# Patient Record
Sex: Male | Born: 1960 | Race: Black or African American | Hispanic: No | Marital: Married | Smoking: Never smoker
Health system: Southern US, Community
[De-identification: ages and names within clinical notes are randomized; demographics above are authoritative.]

## PROBLEM LIST (undated history)

## (undated) DIAGNOSIS — N186 End stage renal disease: Secondary | ICD-10-CM

## (undated) DIAGNOSIS — N289 Disorder of kidney and ureter, unspecified: Secondary | ICD-10-CM

## (undated) DIAGNOSIS — M1990 Unspecified osteoarthritis, unspecified site: Secondary | ICD-10-CM

## (undated) DIAGNOSIS — K828 Other specified diseases of gallbladder: Secondary | ICD-10-CM

## (undated) DIAGNOSIS — N2889 Other specified disorders of kidney and ureter: Secondary | ICD-10-CM

## (undated) DIAGNOSIS — J9601 Acute respiratory failure with hypoxia: Secondary | ICD-10-CM

## (undated) DIAGNOSIS — I4892 Unspecified atrial flutter: Secondary | ICD-10-CM

## (undated) DIAGNOSIS — I82409 Acute embolism and thrombosis of unspecified deep veins of unspecified lower extremity: Secondary | ICD-10-CM

## (undated) DIAGNOSIS — Z905 Acquired absence of kidney: Secondary | ICD-10-CM

## (undated) DIAGNOSIS — K219 Gastro-esophageal reflux disease without esophagitis: Secondary | ICD-10-CM

## (undated) DIAGNOSIS — E611 Iron deficiency: Secondary | ICD-10-CM

## (undated) DIAGNOSIS — I2510 Atherosclerotic heart disease of native coronary artery without angina pectoris: Secondary | ICD-10-CM

## (undated) DIAGNOSIS — I219 Acute myocardial infarction, unspecified: Secondary | ICD-10-CM

## (undated) DIAGNOSIS — K5792 Diverticulitis of intestine, part unspecified, without perforation or abscess without bleeding: Secondary | ICD-10-CM

## (undated) DIAGNOSIS — K838 Other specified diseases of biliary tract: Secondary | ICD-10-CM

## (undated) DIAGNOSIS — I639 Cerebral infarction, unspecified: Secondary | ICD-10-CM

## (undated) DIAGNOSIS — Z973 Presence of spectacles and contact lenses: Secondary | ICD-10-CM

## (undated) DIAGNOSIS — I10 Essential (primary) hypertension: Secondary | ICD-10-CM

## (undated) DIAGNOSIS — H5440 Blindness, one eye, unspecified eye: Secondary | ICD-10-CM

## (undated) DIAGNOSIS — R0602 Shortness of breath: Secondary | ICD-10-CM

## (undated) DIAGNOSIS — I509 Heart failure, unspecified: Secondary | ICD-10-CM

## (undated) DIAGNOSIS — Z992 Dependence on renal dialysis: Secondary | ICD-10-CM

## (undated) DIAGNOSIS — K9189 Other postprocedural complications and disorders of digestive system: Secondary | ICD-10-CM

## (undated) DIAGNOSIS — K429 Umbilical hernia without obstruction or gangrene: Secondary | ICD-10-CM

## (undated) HISTORY — PX: FINGER SURGERY: SHX640

## (undated) HISTORY — DX: Acute myocardial infarction, unspecified: I21.9

## (undated) HISTORY — DX: Atherosclerotic heart disease of native coronary artery without angina pectoris: I25.10

## (undated) HISTORY — DX: Essential (primary) hypertension: I10

## (undated) HISTORY — DX: End stage renal disease: N18.6

## (undated) HISTORY — PX: OTHER SURGICAL HISTORY: SHX169

## (undated) HISTORY — PX: HEMORRHOID SURGERY: SHX153

---

## 1997-09-04 ENCOUNTER — Inpatient Hospital Stay (HOSPITAL_COMMUNITY): Admission: RE | Admit: 1997-09-04 | Discharge: 1997-09-06 | Payer: Self-pay | Admitting: Cardiovascular Disease

## 1998-05-24 HISTORY — PX: NEPHRECTOMY: SHX65

## 1998-09-25 ENCOUNTER — Inpatient Hospital Stay (HOSPITAL_COMMUNITY): Admission: EM | Admit: 1998-09-25 | Discharge: 1998-09-28 | Payer: Self-pay | Admitting: Emergency Medicine

## 1998-09-25 ENCOUNTER — Encounter: Payer: Self-pay | Admitting: Emergency Medicine

## 1999-10-15 ENCOUNTER — Encounter: Payer: Self-pay | Admitting: Cardiovascular Disease

## 1999-10-15 ENCOUNTER — Encounter: Admission: RE | Admit: 1999-10-15 | Discharge: 1999-10-15 | Payer: Self-pay | Admitting: Cardiovascular Disease

## 2000-04-22 ENCOUNTER — Encounter: Payer: Self-pay | Admitting: Emergency Medicine

## 2000-04-22 ENCOUNTER — Inpatient Hospital Stay (HOSPITAL_COMMUNITY): Admission: EM | Admit: 2000-04-22 | Discharge: 2000-04-22 | Payer: Self-pay | Admitting: Emergency Medicine

## 2000-05-21 ENCOUNTER — Emergency Department (HOSPITAL_COMMUNITY): Admission: EM | Admit: 2000-05-21 | Discharge: 2000-05-21 | Payer: Self-pay | Admitting: Emergency Medicine

## 2000-05-21 ENCOUNTER — Encounter: Payer: Self-pay | Admitting: *Deleted

## 2000-06-09 ENCOUNTER — Emergency Department (HOSPITAL_COMMUNITY): Admission: EM | Admit: 2000-06-09 | Discharge: 2000-06-09 | Payer: Self-pay | Admitting: Emergency Medicine

## 2000-06-10 ENCOUNTER — Inpatient Hospital Stay (HOSPITAL_COMMUNITY): Admission: EM | Admit: 2000-06-10 | Discharge: 2000-06-15 | Payer: Self-pay | Admitting: Emergency Medicine

## 2000-06-10 ENCOUNTER — Encounter: Payer: Self-pay | Admitting: Emergency Medicine

## 2000-06-12 ENCOUNTER — Encounter: Payer: Self-pay | Admitting: Internal Medicine

## 2000-06-15 ENCOUNTER — Encounter: Payer: Self-pay | Admitting: Cardiovascular Disease

## 2000-06-16 ENCOUNTER — Inpatient Hospital Stay (HOSPITAL_COMMUNITY): Admission: EM | Admit: 2000-06-16 | Discharge: 2000-06-22 | Payer: Self-pay | Admitting: Emergency Medicine

## 2000-06-16 ENCOUNTER — Encounter (INDEPENDENT_AMBULATORY_CARE_PROVIDER_SITE_OTHER): Payer: Self-pay | Admitting: Specialist

## 2000-06-16 ENCOUNTER — Encounter: Payer: Self-pay | Admitting: Emergency Medicine

## 2000-07-11 ENCOUNTER — Encounter: Payer: Self-pay | Admitting: Cardiovascular Disease

## 2000-07-11 ENCOUNTER — Encounter: Admission: RE | Admit: 2000-07-11 | Discharge: 2000-07-11 | Payer: Self-pay | Admitting: Cardiovascular Disease

## 2001-07-02 ENCOUNTER — Emergency Department (HOSPITAL_COMMUNITY): Admission: EM | Admit: 2001-07-02 | Discharge: 2001-07-02 | Payer: Self-pay | Admitting: Emergency Medicine

## 2001-07-04 ENCOUNTER — Inpatient Hospital Stay (HOSPITAL_COMMUNITY): Admission: AD | Admit: 2001-07-04 | Discharge: 2001-07-06 | Payer: Self-pay | Admitting: Cardiovascular Disease

## 2001-07-04 ENCOUNTER — Encounter: Payer: Self-pay | Admitting: Cardiovascular Disease

## 2001-07-05 ENCOUNTER — Encounter: Payer: Self-pay | Admitting: Cardiovascular Disease

## 2001-11-09 ENCOUNTER — Encounter: Admission: RE | Admit: 2001-11-09 | Discharge: 2001-11-09 | Payer: Self-pay | Admitting: Orthopedic Surgery

## 2001-11-09 ENCOUNTER — Encounter: Payer: Self-pay | Admitting: Orthopedic Surgery

## 2001-11-13 ENCOUNTER — Encounter: Admission: RE | Admit: 2001-11-13 | Discharge: 2001-11-13 | Payer: Self-pay | Admitting: Infectious Diseases

## 2001-12-11 ENCOUNTER — Encounter: Admission: RE | Admit: 2001-12-11 | Discharge: 2001-12-11 | Payer: Self-pay | Admitting: Infectious Diseases

## 2001-12-14 ENCOUNTER — Encounter: Admission: RE | Admit: 2001-12-14 | Discharge: 2001-12-14 | Payer: Self-pay | Admitting: Infectious Diseases

## 2001-12-17 ENCOUNTER — Inpatient Hospital Stay (HOSPITAL_COMMUNITY): Admission: EM | Admit: 2001-12-17 | Discharge: 2001-12-19 | Payer: Self-pay | Admitting: Emergency Medicine

## 2001-12-17 ENCOUNTER — Encounter: Payer: Self-pay | Admitting: Emergency Medicine

## 2001-12-19 ENCOUNTER — Encounter (INDEPENDENT_AMBULATORY_CARE_PROVIDER_SITE_OTHER): Payer: Self-pay | Admitting: Cardiovascular Disease

## 2002-01-15 ENCOUNTER — Encounter: Admission: RE | Admit: 2002-01-15 | Discharge: 2002-01-15 | Payer: Self-pay | Admitting: Infectious Diseases

## 2002-05-18 ENCOUNTER — Encounter: Payer: Self-pay | Admitting: Emergency Medicine

## 2002-05-18 ENCOUNTER — Inpatient Hospital Stay (HOSPITAL_COMMUNITY): Admission: EM | Admit: 2002-05-18 | Discharge: 2002-05-21 | Payer: Self-pay | Admitting: Emergency Medicine

## 2002-05-18 ENCOUNTER — Encounter: Payer: Self-pay | Admitting: Cardiovascular Disease

## 2002-05-21 ENCOUNTER — Encounter (INDEPENDENT_AMBULATORY_CARE_PROVIDER_SITE_OTHER): Payer: Self-pay | Admitting: Cardiovascular Disease

## 2002-10-21 ENCOUNTER — Encounter: Payer: Self-pay | Admitting: Emergency Medicine

## 2002-10-21 ENCOUNTER — Inpatient Hospital Stay (HOSPITAL_COMMUNITY): Admission: EM | Admit: 2002-10-21 | Discharge: 2002-10-24 | Payer: Self-pay | Admitting: Emergency Medicine

## 2002-10-22 ENCOUNTER — Encounter: Payer: Self-pay | Admitting: Cardiovascular Disease

## 2002-10-23 ENCOUNTER — Encounter: Payer: Self-pay | Admitting: Cardiovascular Disease

## 2003-04-15 ENCOUNTER — Inpatient Hospital Stay (HOSPITAL_COMMUNITY): Admission: EM | Admit: 2003-04-15 | Discharge: 2003-04-17 | Payer: Self-pay | Admitting: Emergency Medicine

## 2003-06-16 ENCOUNTER — Emergency Department (HOSPITAL_COMMUNITY): Admission: EM | Admit: 2003-06-16 | Discharge: 2003-06-17 | Payer: Self-pay | Admitting: Emergency Medicine

## 2003-11-27 ENCOUNTER — Emergency Department (HOSPITAL_COMMUNITY): Admission: EM | Admit: 2003-11-27 | Discharge: 2003-11-27 | Payer: Self-pay | Admitting: Emergency Medicine

## 2004-01-05 ENCOUNTER — Inpatient Hospital Stay (HOSPITAL_COMMUNITY): Admission: EM | Admit: 2004-01-05 | Discharge: 2004-01-10 | Payer: Self-pay | Admitting: *Deleted

## 2004-01-07 ENCOUNTER — Encounter (INDEPENDENT_AMBULATORY_CARE_PROVIDER_SITE_OTHER): Payer: Self-pay | Admitting: Cardiovascular Disease

## 2004-03-30 ENCOUNTER — Emergency Department (HOSPITAL_COMMUNITY): Admission: EM | Admit: 2004-03-30 | Discharge: 2004-03-30 | Payer: Self-pay | Admitting: Emergency Medicine

## 2005-06-20 ENCOUNTER — Emergency Department (HOSPITAL_COMMUNITY): Admission: EM | Admit: 2005-06-20 | Discharge: 2005-06-20 | Payer: Self-pay | Admitting: Emergency Medicine

## 2006-07-07 ENCOUNTER — Inpatient Hospital Stay (HOSPITAL_COMMUNITY): Admission: EM | Admit: 2006-07-07 | Discharge: 2006-07-12 | Payer: Self-pay | Admitting: Emergency Medicine

## 2007-04-10 ENCOUNTER — Encounter: Admission: RE | Admit: 2007-04-10 | Discharge: 2007-04-10 | Payer: Self-pay | Admitting: Cardiovascular Disease

## 2008-10-10 ENCOUNTER — Ambulatory Visit (HOSPITAL_COMMUNITY): Admission: RE | Admit: 2008-10-10 | Discharge: 2008-10-10 | Payer: Self-pay | Admitting: Cardiovascular Disease

## 2008-11-27 ENCOUNTER — Emergency Department (HOSPITAL_COMMUNITY): Admission: EM | Admit: 2008-11-27 | Discharge: 2008-11-27 | Payer: Self-pay | Admitting: Emergency Medicine

## 2009-07-09 ENCOUNTER — Encounter: Admission: RE | Admit: 2009-07-09 | Discharge: 2009-07-09 | Payer: Self-pay | Admitting: Cardiovascular Disease

## 2009-10-05 ENCOUNTER — Inpatient Hospital Stay (HOSPITAL_COMMUNITY): Admission: EM | Admit: 2009-10-05 | Discharge: 2009-10-07 | Payer: Self-pay | Admitting: Emergency Medicine

## 2009-10-07 ENCOUNTER — Encounter (INDEPENDENT_AMBULATORY_CARE_PROVIDER_SITE_OTHER): Payer: Self-pay | Admitting: Cardiovascular Disease

## 2009-12-22 ENCOUNTER — Encounter: Admission: RE | Admit: 2009-12-22 | Discharge: 2009-12-22 | Payer: Self-pay | Admitting: Cardiovascular Disease

## 2009-12-27 ENCOUNTER — Encounter: Admission: RE | Admit: 2009-12-27 | Discharge: 2009-12-27 | Payer: Self-pay | Admitting: Cardiovascular Disease

## 2010-08-10 LAB — DIFFERENTIAL
Basophils Absolute: 0 10*3/uL (ref 0.0–0.1)
Basophils Relative: 1 % (ref 0–1)
Eosinophils Absolute: 0.3 10*3/uL (ref 0.0–0.7)
Eosinophils Relative: 6 % — ABNORMAL HIGH (ref 0–5)
Lymphocytes Relative: 26 % (ref 12–46)
Lymphs Abs: 1.2 10*3/uL (ref 0.7–4.0)
Monocytes Absolute: 0.4 10*3/uL (ref 0.1–1.0)
Monocytes Relative: 9 % (ref 3–12)
Neutro Abs: 2.7 10*3/uL (ref 1.7–7.7)
Neutrophils Relative %: 59 % (ref 43–77)

## 2010-08-10 LAB — BASIC METABOLIC PANEL
BUN: 21 mg/dL (ref 6–23)
BUN: 25 mg/dL — ABNORMAL HIGH (ref 6–23)
CO2: 26 mEq/L (ref 19–32)
CO2: 27 mEq/L (ref 19–32)
Calcium: 8.7 mg/dL (ref 8.4–10.5)
Calcium: 8.9 mg/dL (ref 8.4–10.5)
Chloride: 103 mEq/L (ref 96–112)
Chloride: 107 mEq/L (ref 96–112)
Creatinine, Ser: 2.11 mg/dL — ABNORMAL HIGH (ref 0.4–1.5)
Creatinine, Ser: 2.34 mg/dL — ABNORMAL HIGH (ref 0.4–1.5)
GFR calc Af Amer: 36 mL/min — ABNORMAL LOW (ref >60)
GFR calc Af Amer: 41 mL/min — ABNORMAL LOW (ref >60)
GFR calc non Af Amer: 30 mL/min — ABNORMAL LOW (ref >60)
GFR calc non Af Amer: 34 mL/min — ABNORMAL LOW (ref >60)
Glucose, Bld: 104 mg/dL — ABNORMAL HIGH (ref 70–99)
Glucose, Bld: 106 mg/dL — ABNORMAL HIGH (ref 70–99)
Potassium: 3.9 mEq/L (ref 3.5–5.1)
Potassium: 4.5 mEq/L (ref 3.5–5.1)
Sodium: 135 mEq/L (ref 135–145)
Sodium: 137 mEq/L (ref 135–145)

## 2010-08-10 LAB — COMPREHENSIVE METABOLIC PANEL
ALT: 25 U/L (ref 0–53)
AST: 25 U/L (ref 0–37)
Albumin: 3.2 g/dL — ABNORMAL LOW (ref 3.5–5.2)
Alkaline Phosphatase: 91 U/L (ref 39–117)
BUN: 21 mg/dL (ref 6–23)
CO2: 26 mEq/L (ref 19–32)
Calcium: 9 mg/dL (ref 8.4–10.5)
Chloride: 105 mEq/L (ref 96–112)
Creatinine, Ser: 2.39 mg/dL — ABNORMAL HIGH (ref 0.4–1.5)
GFR calc Af Amer: 36 mL/min — ABNORMAL LOW (ref >60)
GFR calc non Af Amer: 29 mL/min — ABNORMAL LOW (ref >60)
Glucose, Bld: 90 mg/dL (ref 70–99)
Potassium: 4.3 mEq/L (ref 3.5–5.1)
Sodium: 139 mEq/L (ref 135–145)
Total Bilirubin: 0.5 mg/dL (ref 0.3–1.2)
Total Protein: 6.5 g/dL (ref 6.0–8.3)

## 2010-08-10 LAB — LIPID PANEL
Cholesterol: 163 mg/dL (ref 0–200)
HDL: 52 mg/dL (ref >39)
LDL Cholesterol: 62 mg/dL (ref 0–99)
Total CHOL/HDL Ratio: 3.1 RATIO
Triglycerides: 246 mg/dL — ABNORMAL HIGH (ref <150)
VLDL: 49 mg/dL — ABNORMAL HIGH (ref 0–40)

## 2010-08-10 LAB — CARDIAC PANEL(CRET KIN+CKTOT+MB+TROPI)
CK, MB: 3.9 ng/mL (ref 0.3–4.0)
CK, MB: 4.3 ng/mL — ABNORMAL HIGH (ref 0.3–4.0)
Relative Index: 0.7 (ref 0.0–2.5)
Relative Index: 0.9 (ref 0.0–2.5)
Total CK: 479 U/L — ABNORMAL HIGH (ref 7–232)
Total CK: 536 U/L — ABNORMAL HIGH (ref 7–232)
Troponin I: 0.24 ng/mL — ABNORMAL HIGH (ref 0.00–0.06)
Troponin I: 0.25 ng/mL — ABNORMAL HIGH (ref 0.00–0.06)

## 2010-08-10 LAB — CBC
HCT: 33.5 % — ABNORMAL LOW (ref 39.0–52.0)
Hemoglobin: 11.5 g/dL — ABNORMAL LOW (ref 13.0–17.0)
MCHC: 34.3 g/dL (ref 30.0–36.0)
MCV: 89.8 fL (ref 78.0–100.0)
Platelets: 222 10*3/uL (ref 150–400)
RBC: 3.73 MIL/uL — ABNORMAL LOW (ref 4.22–5.81)
RDW: 15.9 % — ABNORMAL HIGH (ref 11.5–15.5)
WBC: 4.7 10*3/uL (ref 4.0–10.5)

## 2010-08-10 LAB — HEMOGLOBIN A1C
Hgb A1c MFr Bld: 5.8 % — ABNORMAL HIGH (ref <5.7)
Mean Plasma Glucose: 120 mg/dL — ABNORMAL HIGH (ref <117)

## 2010-08-10 LAB — BRAIN NATRIURETIC PEPTIDE
Pro B Natriuretic peptide (BNP): 36 pg/mL (ref 0.0–100.0)
Pro B Natriuretic peptide (BNP): 42 pg/mL (ref 0.0–100.0)

## 2010-08-10 LAB — APTT: aPTT: 33 seconds (ref 24–37)

## 2010-08-10 LAB — PROTIME-INR
INR: 0.95 (ref 0.00–1.49)
Prothrombin Time: 12.6 seconds (ref 11.6–15.2)

## 2010-08-10 LAB — GLUCOSE, CAPILLARY
Glucose-Capillary: 102 mg/dL — ABNORMAL HIGH (ref 70–99)
Glucose-Capillary: 105 mg/dL — ABNORMAL HIGH (ref 70–99)
Glucose-Capillary: 111 mg/dL — ABNORMAL HIGH (ref 70–99)
Glucose-Capillary: 121 mg/dL — ABNORMAL HIGH (ref 70–99)
Glucose-Capillary: 130 mg/dL — ABNORMAL HIGH (ref 70–99)
Glucose-Capillary: 151 mg/dL — ABNORMAL HIGH (ref 70–99)
Glucose-Capillary: 90 mg/dL (ref 70–99)

## 2010-08-10 LAB — POCT CARDIAC MARKERS
CKMB, poc: 4.1 ng/mL (ref 1.0–8.0)
Myoglobin, poc: 391 ng/mL (ref 12–200)
Troponin i, poc: <0.05 ng/mL (ref 0.00–0.09)

## 2010-08-10 LAB — TROPONIN I: Troponin I: 0.25 ng/mL — ABNORMAL HIGH (ref 0.00–0.06)

## 2010-08-10 LAB — CK TOTAL AND CKMB (NOT AT ARMC)
CK, MB: 5.1 ng/mL — ABNORMAL HIGH (ref 0.3–4.0)
Relative Index: 1.1 (ref 0.0–2.5)
Total CK: 470 U/L — ABNORMAL HIGH (ref 7–232)

## 2010-08-10 LAB — MAGNESIUM: Magnesium: 2.5 mg/dL (ref 1.5–2.5)

## 2010-09-01 LAB — POCT I-STAT 3, VENOUS BLOOD GAS (G3P V)
Acid-Base Excess: 1 mmol/L (ref 0.0–2.0)
Bicarbonate: 27.5 mEq/L — ABNORMAL HIGH (ref 20.0–24.0)
O2 Saturation: 67 %
TCO2: 29 mmol/L (ref 0–100)
pCO2, Ven: 51 mmHg — ABNORMAL HIGH (ref 45.0–50.0)
pH, Ven: 7.339 — ABNORMAL HIGH (ref 7.250–7.300)
pO2, Ven: 38 mmHg (ref 30.0–45.0)

## 2010-09-01 LAB — POCT I-STAT 3, ART BLOOD GAS (G3+)
Acid-Base Excess: 1 mmol/L (ref 0.0–2.0)
Bicarbonate: 26.9 mEq/L — ABNORMAL HIGH (ref 20.0–24.0)
O2 Saturation: 96 %
TCO2: 28 mmol/L (ref 0–100)
pCO2 arterial: 45.2 mmHg — ABNORMAL HIGH (ref 35.0–45.0)
pH, Arterial: 7.382 (ref 7.350–7.450)
pO2, Arterial: 82 mmHg (ref 80.0–100.0)

## 2010-09-01 LAB — GLUCOSE, CAPILLARY
Glucose-Capillary: 108 mg/dL — ABNORMAL HIGH (ref 70–99)
Glucose-Capillary: 136 mg/dL — ABNORMAL HIGH (ref 70–99)

## 2010-10-09 NOTE — Discharge Summary (Signed)
. [facility]  Patient:    [name], [name]                       MRN: [id number] Adm. Date:  [id number] Disc. Date: [id number] Attending:  Hanley Ben CC:         Birdie Riddle, M.D.   Discharge Summary  DISCHARGE DIAGNOSES: 1. Right renal carcinoma. 2. Right hydronephrosis. 3. Coronary artery disease. 4. Cardiomyopathy.  PROCEDURE DONE:  Right radical nephrectomy on June 17, 2000.  CONSULTATIONS:  Birdie Riddle, M.D.  HISTORY OF PRESENT ILLNESS:  The patient is a 50 year old male who was admitted to the hospital a week before this admission complaining of fever, nausea and vomiting and right flank pain.  During the work-up he was found to have a solid mass in the lower pole of the right kidney.  The CT scan showed a 6.8 x 7.5 cm solid mass in the lower pole of the right kidney consistent with renal cell carcinoma and right upper pole hydronephrosis.  The patient wanted to go home to take care of some personal business; however, he returned to the emergency room the next day with the same symptoms and he was admitted for treatment of his right renal cell carcinoma.  PHYSICAL EXAMINATION:  VITAL SIGNS:  Blood pressure 147/94, pulse 69, respiratory rate 16, temperature 98.  HEENT:  His head is normal.  Pupils are equal, round, reactive to light and accommodation.  Ears, nose and throat within normal limits.  NECK:  Supple.  No cervical lymph nodes.  No thyromegaly.  CHEST:  Symmetrical.  LUNGS:  Full expanded and clear to auscultation and percussion.  CARDIOVASCULAR:  Regular rhythm with no murmurs or gallops.  ABDOMEN:  Soft and nondistended.  Liver, spleen and kidneys not palpable.  No organomegaly.  He had right flank and right CVA tenderness.  Bowel sounds were normal.  GENITALIA:  Penis is uncircumcised.  Meatus is normal.  Scrotum, cords, testicles and epididymis are within normal limits.  EXTREMITIES:  Within normal  limits.  LABS:  Hemoglobin on admission was 13.5, hematocrit 39.1, wbc 6.0.  PT and PTT were within normal limits.  Sodium 130, potassium 3.9, BUN 18, creatinine 1.1. Urinalysis normal.  Chest x-ray showed cardiomegaly.  HOSPITAL COURSE:  The patient had a right radical nephrectomy on June 17, 2000.  Postoperative course was uneventful.  The patient had a low grade temperature postoperatively.  He tolerated his diet well.  The diet was gradually advanced.  He was voiding well.  His urine was clear.  Three days postoperatively he had a hematoma of the left elbow secondary to IV infiltration and the hematoma was gradually decreasing in size.  On June 22, 2000, he was eating well.  His abdomen was soft and nondistended, bowel sounds were normal.  Wound was clean and dry.  He was then discharged home.  DISCHARGE MEDICATIONS: 1. Protonix 40 mg p.o. daily. 2. Procardia XL 60 mg p.o. daily. 3. Hydrochlorothiazide 12.5 mg p.o. daily. 4. Toprol XL 25 mg p.o. daily. 5. Diovan 80 mg p.o. daily. 6. Clonidine 0.2 mg p.o. daily. 7. Tequin 400 mg p.o. daily for one week. 8. Coumadin 5 mg p.o. daily. 9. Percocet 5/325 one or two tablets p.o. q.4h. p.r.n. for pain.  DISCHARGE ACTIVITIES:  The patient is instructed not to do any lifting, straining, or any driving until further advised.  DISCHARGE DIET:  Low fat, low salt diet.  CONDITION  ON DISCHARGE:  Improved. DD:  06/22/00 TD:  06/22/00 Job: [id number]

## 2010-10-09 NOTE — Cardiovascular Report (Signed)
NAME:  [name], [name]                           ACCOUNT NO.:  [id number]   MEDICAL RECORD NO.:  [id number]                   PATIENT TYPE:  INP   LOCATION:  2016                                 FACILITY:  [facility]   PHYSICIAN:  Birdie Riddle, M.D.               DATE OF BIRTH:  1960/07/03   DATE OF PROCEDURE:  10/24/2002  DATE OF DISCHARGE:                              CARDIAC CATHETERIZATION   PROCEDURE:  1. Right and left heart catheterization.  2. Selective coronary angiography.  3. Swan-Ganz catheter placement.  4. Cardiac output studies.   INDICATIONS:  This 50 year old black male has history of congestive heart  failure, dilated cardiomyopathy, and reversible ischemia on his recent  nuclear stress test.   APPROACH:  Right femoral artery using 6-French diagnostic catheter and right  femoral vein using 8-French diagnostic catheter.   COMPLICATIONS:  None.   Perclose suture applied successfully.   HEMODYNAMIC DATA:  The left ventricular pressure was 169/24 and aortic  pressure was 164/115.  The right atrial pressure was 22, right ventricle  pressure 65/19, pulmonary artery pressure 40/28, wedge pressure 23/21.   Cardiac output was 5.6 L/minute.  Cardiac index was 2.49.  SVR was elevated  at 1611.  Oxygen saturation was 69% in pulmonary artery and 97% in aorta.   CORONARY ANATOMY:  The left main coronary artery was very short.   Left anterior descending coronary artery:  The left anterior descending  coronary artery had approximately 20-30% lesions x2 and it wrapped around  the apex of the heart supplying distal one-third of the posterior septum.   Left circumflex coronary artery:  The left circumflex coronary artery was  nondominant, otherwise, unremarkable.  Had a very large ramus branch, small  obtuse marginal branch one and four, and normal size obtuse marginal branch  two and three.  The posterior descending coronary artery was unremarkable.   Right coronary  artery:  The right coronary artery was nondominant, otherwise  unremarkable.   LEFT VENTRICULOGRAM:  The left ventriculogram was not done to conserve the  dye use.  A total of 80 mL of dye was used.   MEDICATIONS USED:  The patient received Zofran 4 mg IV for nausea post  cardiac output studies and received 2.5 mg Lopressor, one sublingual  nitroglycerin spray, 200 mcg intracoronary nitroglycerin, 40 mg of Lasix for  blood pressure control and congestive heart failure.   IMPRESSION:  1. Mild left anterior descending coronary artery disease.  2. Hypertensive heart disease.   RECOMMENDATIONS:  This patient will be treated medically with Birdie Riddle, M.D.                                               Birdie Riddle, M.D.    ASK/MEDQ  D:  10/24/2002  T:  10/24/2002  Job:  [id number]

## 2010-10-09 NOTE — Discharge Summary (Signed)
NAME:  [name], [name]                           ACCOUNT NO.:  [id number]   MEDICAL RECORD NO.:  [id number]                   PATIENT TYPE:  INP   LOCATION:  2016                                 FACILITY:  [facility]   PHYSICIAN:  Birdie Riddle, M.D.               DATE OF BIRTH:  [birth year]   DATE OF ADMISSION:  10/21/2002  DATE OF DISCHARGE:  10/24/2002                                 DISCHARGE SUMMARY   PRINCIPAL DIAGNOSES:  1. Noncardiac chest pain.  2. Dilated cardiomyopathy.  3. Congestive heart failure.  4. Hypertension.   DISCHARGE MEDICATIONS:  1. Lasix 40 mg one daily.  2. K-Dur 10 mEq two daily.  3. Avapro 150 mg one daily.  4. Norvasc 10 mg daily.  5. Coreg 12.5 mg b.i.d.  6. Flexeril 10 mg b.i.d.  7. Imdur 60 mg one daily.  8. Darvocet-N 100 one q.i.d.  9. Plavix 75 mg one daily.  10.      ACE inhibitor not used due to episode of cough.  11.      Pain management as above.   ACTIVITY:  As tolerated.   DIET:  Low-fat, low-salt diet.   WOUND CARE:  The patient is to notify us if he has right groin pain,  swelling, or discharge.   FOLLOWUP:  By Dr. Dixie Dials in two weeks. The patient to call [phone].   HISTORY OF PRESENT ILLNESS:  This 50 year old black male with known dilated  cardiomyopathy had an episode of chest pain in the left scapular area.   PHYSICAL EXAMINATION:  HEENT:  Grossly unremarkable.  NECK:  No JVD. No carotid bruit.  LUNGS:  Clear bilaterally.  HEART:  Normal S1 and S2 with grade 2/6 systolic murmur.  ABDOMEN:  Slightly distended, soft.  EXTREMITIES:  No edema.   LABORATORY DATA:  Hemoglobin 14, hematocrit 42, normal WBC count, platelet  count. Normal electrolytes, BUN, creatinine. Normal CK-MB. Slightly high  Troponin-I 0.27.   X-rays of the spine unremarkable. Nuclear stress test:  Global hypokinesia  with a fixed defect involving the basilar half of the inferior wall and  patchy perfusion defect in the mid portion of the lateral  wall. Chest x-ray:  Cardiomegaly and marked vascular congestion.   EKG:  Sinus rhythm with a right atrial enlargement and T-wave abnormality.  Also lateral ischemia and left ventricular hypertrophy.   Right and left heart catheterization showed cardiac output of 5.6. Elevated  left ventricular pressure of 169 x 24. Aortic pressure of 164 x 115. Wedge  pressure mean was elevated at 28. O2 saturation was 69%. Pulmonary artery  97% in aorta. Coronary artery showed a 20-30% lesion x2 in the LAD and RCA  was small and nondominant. The patient required 4 mg of Nausea postcardiac  output measurements and he also received 2.5 mg of Lopressor, sublingual  nitroglycerin spray and 40 of Lasix for blood pressure  control.   HOSPITAL COURSE:  The patient was admitted to the Telemetry unit. His  cardiac enzymes were abnormal. He received IV Lasix for diuresis. He  underwent a nuclear stress test because of his somewhat atypical chest pain.  But this turned out to be marginally positive for patchy areas of reversible  ischemia and so he underwent right and left heart catheterization, which  failed to show any significant coronary artery disease. He was discharged  home in satisfactory condition with fairly good control of his blood  pressure. He will be followed by me in two weeks.                                               Birdie Riddle, M.D.    ASK/MEDQ  D:  11/07/2002  T:  11/08/2002  Job:  [id number]

## 2010-10-09 NOTE — Consult Note (Signed)
NAME:  [name], [name]                           ACCOUNT NO.:  [id number]   MEDICAL RECORD NO.:  [id number]                   PATIENT TYPE:  INP   LOCATION:  4706                                 FACILITY:  [facility]   PHYSICIAN:  Pramod P. Leonie Man, MD                 DATE OF BIRTH:  [birth year]   DATE OF CONSULTATION:  01/05/2004  DATE OF DISCHARGE:                                   CONSULTATION   REFERRING PHYSICIAN:  Dr. Birdie Riddle.   REASON FOR REFERRAL:  Right hand weakness.   HISTORY OF PRESENT ILLNESS:  [name] is Stephenson 27 year old African American  male who complained of right hand pain and weakness yesterday.  He stated  that he noted that he was dropping objects and had difficulty holding  objects with his hand.  This morning, he noticed worsening of the hand  weakness further, as well as some right shoulder and posterior neck pain and  worsening of his preexisting right leg weakness.  He denies any headache or  blurred vision.  There is no prior history of stroke, TIA or significant  neurological problems.  He does have Stephenson history of some right leg weakness  which is longstanding following Stephenson spider or Stephenson snake bite several years ago.  He has some right knee weakness from that and walks with Stephenson cane.   PAST MEDICAL HISTORY:  His past medical history is significant for:  1. Congestive heart failure.  2. Stephenson remote history of gout.  3. Ischemic heart disease, status post angioplasty stenting.   REVIEW OF SYSTEMS:  Review of systems is significant for some chest pain and  shortness of breath and right hand pain, weakness and neck pain.   PAST SURGICAL HISTORY:  None.   MEDICATION ALLERGIES:  None.   SOCIAL HISTORY:  The patient lives with Stephenson significant-other.  He is on  disability and he does not smoke or drink.   PHYSICAL EXAM:  GENERAL:  Physical exam reveals an obese young male who is  not in distress.  VITAL SIGNS:  He is afebrile.  Pulse rate is 70 per minute, regular.  Respiratory rate is 16 per minute.  Distal pulses are well-felt.  HEENT:  Head is nontraumatic.  NECK:  Neck movements are painful, particularly extension.  There is minor  muscle spasm noted in the paraspinal posterior neck regions.  There is no  point tenderness seen.  EXTREMITIES:  The patient has swelling and increased temperature over the  right wrist and proximal metacarpophalangeal joints.  He also has painful  swelling of the right shoulder with limitation of movements.  CARDIAC:  Regular heart sounds.  LUNGS:  Lungs clear to auscultation.  ABDOMEN:  Abdomen soft and nontender.  NEUROLOGICAL EXAM:  The patient is awake, alert, oriented x3 with normal  speech and language function.  There is no aphasia, apraxia or dysarthria.  Pupils are equal, reactive to light and accommodation.  Visual acuity and  fields are adequate.  Face is symmetric.  Palate elevates normally.  Tongue  is midline.  Motor system exam reveals right upper extremity exam is limited  essentially secondary to pain.  He is able to hold his hand up against  gravity and move his elbow quite well.  Movements of the wrist and fingers  are limited secondary to pain.  Right shoulder movements are severely  limited due to pain.  He can move both lower extremities against gravity,  the right one is weaker than the left.  Deep tendon reflexes are 2+ and  symmetric.  Plantars are both downgoing.  Sensation is grossly intact.  Coordination is impaired on the right.   DATA REVIEWED:  Non-contrast CAT scan of the head done today reveals no  acute abnormalities.   IMPRESSION:  [age]-year-old male with right hand pain and weakness likely  secondary to inflammatory arthritis involving the right wrist, hand and  shoulder.  Etiology most likely gout, given his remote history of gout.  I  doubt he had Stephenson primary neurological reason for his weakness.   PLAN:  The patient is being admitted to Dr. Merrilee Jansky service for further   evaluation.  Recommend treatment of gout with indomethacin 75 mg 3 times Stephenson  day and allopurinol.  X-rays of the right hand, shoulder and neck.  If the  patient's neck pain and right hand weakness persist despite adequate pain  control of his gout, we may consider doing MRI scan of the cervical spine.  At the present time, his pain is severe and he will be unable to tolerate  lying flat in the MRI scan.  I discussed this with the patient and answered  questions.  I will be happy to follow the patient in consult.  Kindly call  for questions.                                               Pramod P. Leonie Man, MD    PPS/MEDQ  D:  01/05/2004  T:  01/06/2004  Job:  [id number]

## 2010-10-09 NOTE — Discharge Summary (Signed)
NAME:  [name], [name]                           ACCOUNT NO.:  [id number]   MEDICAL RECORD NO.:  [id number]                   PATIENT TYPE:  INP   LOCATION:  [id number]                                 FACILITY:  [facility]   PHYSICIAN:  Birdie Riddle, M.D.               DATE OF BIRTH:  [birth year]   DATE OF ADMISSION:  05/18/2002  DATE OF DISCHARGE:  05/21/2002                                 DISCHARGE SUMMARY   PRINCIPAL DIAGNOSES:  1. Congestive heart failure.  2. Hypertension.  3. Diabetic cardiomyopathy.  4. Status post right nephrectomy for renal cell cancer.   DISCHARGE MEDICATIONS:  1. Cozaar 100 mg one p.o. daily.  2. Aspirin 81 mg daily.  3. Protonix 40 mg daily.  4. Lanoxin 0.125 mg daily.  5. Altace 10 mg twice daily.  6. Imdur 120 mg one p.o. daily.  7. Coreg 12.5 mg one twice daily.   DISCHARGE DIET:  Low-fat, low-salt diet.   DISCHARGE ACTIVITY:  As tolerated.   FOLLOW-UP:  Follow up with Birdie Riddle, M.D., in one week.   CONDITION ON DISCHARGE:  Improved.   PRINCIPAL PROCEDURES:  A 2-D echocardiogram showing 30-35% ejection fraction  with a mild LVH and mild MR.  This was stable or slightly improved from  previous study.   HISTORY OF PRESENT ILLNESS:  This 50 year old black male with a history of  dilated cardiomyopathy and very poor LV function has hypertension and  alcoholic cardiomyopathy.  He also had a history of a right nephrectomy for  renal cell CA.  He presented with paroxysmal nocturnal dyspnea, orthopnea,  leg swelling without palpitations, fevers, chills, and cough.  He had a  vague history of chest pain.   PHYSICAL EXAMINATION:  GENERAL APPEARANCE:  The patient was alert and  oriented x 3 and in no acute distress.  VITAL SIGNS:  The blood pressure was 160/120, pulse 68, and respirations 18.  HEENT:  Conjunctivae pink.  Fundi negative for papilledema.  NECK:  Supple with positive JVD and bilateral rhonchi.  CARDIOVASCULAR:  Regular rate and  rhythm with a normal S1 and S2 plus S3 and  S4 gallop.  ABDOMEN:  Soft.  Bowel sounds present.  EXTREMITIES:  No cyanosis, clubbing, or edema.   LABORATORY DATA:  The EKG showed a normal sinus rhythm with right atrial  enlargement and LVH with strain.  CPK 154, MB 2.6, troponin I 0.30.  Potassium 3.9, BUN 19, creatinine 1.2.  The BNP was 1080.  An x-ray of the  chest showed cardiomegaly and pulmonary venous hypertension.  The EKG showed  sinus rhythm with right atrial enlargement and moderate LVH.   HOSPITAL COURSE:  The patient was admitted to the telemetry unit.  A  myocardial infarction was ruled out.  His blood pressure was very well  controlled with the use of diuretic, ACE inhibitor, and beta blocker  combinations.  His  heart failure also improved with the use of diuretic.  Within 72 hours, the patient was feeling well enough to go home.  He  underwent echocardiographic evaluation that showed moderate improvement in  his left ventricular systolic function.  There was mild mitral regurgitation  and a dilated left atrium.  He was advised to comply with his medications to  avoid future admission with heart failure and uncontrolled hypertension.  The patient seemed to agree, however, due to monetary reasons he has shown  this behavior of noncompliance off an on.  He is currently applying for  disability.  This may assist him in procuring medications.  He will also be  directed to seek a local medical assistance program.   DISPOSITION:  He was discharged home in satisfactory condition on May 21, 2002, with follow-up by me in one week.  Most of the medication samples  were provided from the office.                                               Birdie Riddle, M.D.    ASK/MEDQ  D:  05/22/2002  T:  05/23/2002  Job:  [id number]

## 2010-10-09 NOTE — Consult Note (Signed)
[place]  Patient:    [name], [name]                       MRN: [id number] Adm. Date:  [id number] Disc. Date: [id number] Attending:  Katy Apo                          Consultation Report  REASON FOR CONSULTATION:  Right renal mass.  HISTORY OF PRESENT ILLNESS:  Patient is a 50 year old male who was admitted about a week ago with fever and right flank pain.  Patient was worked up and a renal ultrasound showed right upper pole hydronephrosis and dilated renal pelvis and a solid mass below pole of the right kidney.  CT scan of the abdomen showed right hydronephrosis and a 6.7 cm mass in the lower pole of the right kidney consistent with renal cell carcinoma.  PAST MEDICAL HISTORY:  Acute angina in November 2001 and had an emergency angioplasty for coronary artery disease.  MEDICATIONS: 1. Procardia XL 30 mg p.o. daily. 2. Clonidine 0.2 mg p.o. t.i.d. 3. Lasix 40 mg p.o. b.i.d. 4. K-Dur 10 mEq b.i.d. 5. Lotensin 10 mg p.o. b.i.d. 6. Coumadin 5 mg p.o. daily.  SOCIAL HISTORY:  The patient does not smoke nor drink.  He is not married and has two children age 38 and 42.  REVIEW OF SYSTEMS:  The patient denies frequency, hesitancy, dysuria, or gross hematuria.  On admission he had nausea and vomiting and he has not had any nausea or vomiting since.  PHYSICAL EXAMINATION:  GENERAL:  This is a well ______ 50 year old male in no acute distress at this time.  VITAL SIGNS:  Blood pressure is now 156/100, pulse 60, respirations 20, temperature 97.9.  ABDOMEN:  Soft, mildly obese.  Liver, spleen, and kidneys:  Not palpable.  No CVA tenderness.  GENITOURINARY:  Penis is uncircumcised.  Meatus is normal.  Scrotum, testicles, cords, and epididymes are all normal.  IMPRESSION:  Right hydronephrosis and right renal cell carcinoma.  Patient needs a right radical nephrectomy.  First, we need to discontinue his Coumadin and schedule patient  for a right nephrectomy when his PT is within normal limits.  Also, patient would like to go home and take care of some personal business. He will call me with a date within the next few days that is convenient for him.  Thank you.  I will follow the patient with you. DD:  06/15/00 TD:  06/15/00 Job: [id number]

## 2010-10-09 NOTE — Discharge Summary (Signed)
[place]. [facility]  Patient:    BRYAM, BANDT Visit Number: [id number] MRN: [id number]          Service Type: MED Location: [phone] Attending Physician:  Birdie Riddle Dictated by:   Birdie Riddle, M.D. Admit Date:  07/04/2001 Discharge Date: 07/06/2001                             Discharge Summary  DISCHARGE DIAGNOSES: 1. Accelerated hypertension. 2. Abdominal pain. 3. Dilated cardiomyopathy. 4. Status post right kidney surgery for renal cell cancer.  DISCHARGE MEDICATIONS: 1. Norvasc 5 mg one twice daily. 2. Toprol XL 50 mg 1/2 twice daily. 3. Diovan 160 mg one daily. 4. Clonidine 0.2 mg three times daily. 5. Nitroglycerin 0.4 mg per hour, new one daily in a.m., off at bedtime. 6. Tylenol with codeine three to four times daily as needed for pain. 7. Robitussin DM tsp four times daily as needed. 8. Lasix 20 mg one daily. 9. K-Dur 10 mEq one daily.  ACTIVITY:  As tolerated.  DIET:  Low fat, low salt diet.  FOLLOWUP:  Followup by Dr. Dixie Dials in one week.  The patient is to call [phone].  HISTORY OF PRESENT ILLNESS:  This is a 50 year old, African-American male with right-sided kidney surgery scar and abdominal pain along with headache, nausea, diarrhea and dizziness.  The patient had some leg swelling, however, this was improving with use of diuretic.  She has a strong history of hypertension x12 years and has recently reduced alcohol intake.  He had right-sided kidney surgery in January 2002, by Dr. Janice Norrie.  He has remained noncompliant on medications, some of it due to monetary reasons.  PHYSICAL EXAMINATION:  VITAL SIGNS:  Temperature 98, pulse 70, respiratory rate 20, blood pressure 180/130, height 6 feet, weight 234 pounds.  GENERAL:  The patient was alert and oriented x3.  HEENT:  Normocephalic, atraumatic.  Eyes brown with pupils are equal, round and reactive to light, extraocular movements intact.  Funduscopic  exam negative for papilledema.  Mucous membranes pink and moist.  NECK:  No jugular venous distention, no carotid bruits.  LUNGS:  Clear bilaterally.  HEART:  Normal, S1, S2.  ABDOMEN:  Soft with a right lower quadrant area tenderness.  Bowel sounds positive.  EXTREMITIES:  No edema, cyanosis or clubbing.  NEUROLOGIC:  Cranial nerves II-XII intact.  LABORATORY DATA AND X-RAY FINDINGS:  Normal electrolytes with BUN and creatinine borderline at 26 and 1.7, glucose borderline at 119.  Hemoglobin 12.6, hematocrit 37.7, normal WBC and platelet count.  EKG revealed sinus rhythm with left ventricular hypertrophy with strain and left ventricular enlargement.  X-ray of the chest revealed mild congestive heart failure and large pericardial silhouette without an infiltrate.  X-ray of the abdomen without any acute abdominal process.  CT scan of the abdomen and pelvis revealed status post right nephrectomy with no evidence of intestinal obstruction.  Echocardiogram revealed mild to moderately dilated left ventricle with 15-20% ejection fraction and mild mitral regurgitation. Dilated left and right atrium and moderate tricuspid regurgitation.  HOSPITAL COURSE:  The patient was admitted to the telemetry unit.  He was started on his home medications with nitroglycerin.  Echocardiogram was ordered for LV function evaluation.  His condition improved remarkably within the first 24-48 hours of hospitalization.  Diovan was added along with Tylenol with codeine and Robitussin for nonproductive cough symptoms.  Clonidine dose was increased and  CT of the abdomen was obtained to rule out any recurrence of any kidney tumor or obstruction.  The patients condition remained stable.  On July 06, 2001, he was feeling better.  He had no abdominal pain and no headache.  He ambulated well without any discomfort.  Hence, he was discharged to home on July 06, 2001, in satisfactory condition with followup  by me in one to two weeks. Dictated by:   Birdie Riddle, M.D. Attending Physician:  Birdie Riddle DD:  08/01/01 TD:  08/03/01 Job: [id number]

## 2010-10-09 NOTE — H&P (Signed)
[place]. [facility]  Patient:    [name], [name]                       MRN: [id number] Adm. Date:  [id number] Disc. Date: [id number] Attending:  Katy Apo                         History and Physical  CHIEF COMPLAINT:  Right flank pain, nausea, vomiting.  HISTORY OF PRESENT ILLNESS:  The patient is a 50 year old male who was admitted to the hospital about one week ago, complaining of fever, nausea, vomiting, and right flank pain.  His workup included a renal ultrasound that showed right hydronephrosis and a solid mass in the lower pole of the right kidney.  A CT scan showed a 6.8 cm x 7.5 cm solid mass in the lower pole of the right kidney, consistent with renal cell carcinoma.  He also had a right upper pole hydronephrosis.  The patient was advised to have a right radical nephrectomy; however, he wanted to go home to take care of some personal business before surgery.  He returned to the emergency room this morning, complaining of nausea, vomiting, and right flank pain.  The patient is now admitted for his procedure.  PAST MEDICAL HISTORY: 1. History of angina.  He was treated in November 2001, with an angioplasty    for coronary artery disease. 2. History of hypertension.  SOCIAL HISTORY:  He does not smoke or drink.  He is not married.  He has two children ages 53 and 14.  FAMILY HISTORY:  His mother has a history of hypertension.  He has four sisters and four brothers.  ALLERGIES:  No known drug allergies.  CURRENT MEDICATIONS: 1. He is on Catapres 0.2 mg p.o. q.d. 2. Protonix 40 mg p.o. q.d. 3. Procardia XL 60 mg p.o. q.d. 4. Hydrochlorothiazide 12.5 mg q.d. 5. Toprol XL 25 mg p.o. q.d. 6. Diovan 80 mg p.o. q.d.  REVIEW OF SYSTEMS:  No cough, no shortness of breath, no hemoptysis. CARDIOVASCULAR:  No palpitations, no chest pain.  GI:  Nausea and vomiting. No diarrhea or constipation.  GENITOURINARY:  No frequency, hesitancy, or gross  hematuria.  PHYSICAL EXAMINATION:  GENERAL:  This is a well-built 50 year old male, complaining of right flank pain, nausea, and vomiting.  VITAL SIGNS:  Blood pressure on admission 147/95, pulse 69, respirations 16, temperature 98 degrees.  HEENT:  Head is normal.  Pupils equal, reactive to light and accommodation. Ears, nose, throat within normal limits.  NECK:  Supple, no cervical lymph nodes.  No thyromegaly.  CHEST:  Symmetrical.  Lungs are fully expanded and clear to percussion and auscultation.  HEART:  A regular rhythm.  No murmur, rub, or gallop.  ABDOMEN:  Soft, nondistended, nontender.  Liver, kidneys, spleen not palpable. No organomegaly.  He has right flank and right CVA tenderness.  Bowel sounds normal.  GENITOURINARY:  Penis is uncircumcised.  The meatus is normal.  Scrotum, testicles, cords, and epididymides are within normal limits.  EXTREMITIES:  Within normal limits.  No pedal edema.  No deformities.  RECTAL:  Deferred.  ADMISSION DIAGNOSES: 1. Right renal cell carcinoma. 2. Right hydronephrosis. DD:  06/16/00 TD:  06/16/00 Job: [id number]

## 2010-10-09 NOTE — H&P (Signed)
[place]. [facility]  Patient:    [name], [name]                       MRN: [id number] Adm. Date:  [id number] Disc. Date: [id number] Attending:  Katy Apo                         History and Physical  CHIEF COMPLAINT:  Refractory abdominal pain, nausea and vomiting, with fever.  HISTORY OF PRESENT ILLNESS:  This is the second [facility] admission for this 50 year old single black male, who was doing well until approximately three days ago.  The patient awoke with significant nausea and vomiting.  This was associated with flank pain bilaterally.  Over the subsequent days he continued to have recurrent nausea and vomiting.  He noted diffuse arthralgias with night sweats.  He had documented fever during this time as well.  Occasional cough productive of whitish phlegm.  The patient notably did not take Tylenol.  He was not able to eat or drink a significant amount of liquids over the past three days.  He did come to the emergency room briefly, but was advised to take Tylenol for a presumptive viral syndrome.  Today his symptoms progressed, with him having a fever to 103 degrees.  The patient subsequently came to the emergency room for further evaluation.  His history is significant for him having recently starting to work in [employer] of one weeks duration.  He did eat chicken, as well as a salad. He is unaware of anyone else being ill or having similar symptoms.  SOCIAL HISTORY:  The patient does not smoke or drink.  No recent sexual activity.  No illicit drug use.  PAST MEDICAL HISTORY:  His history is significant for him having presented to [facility] in November 2001, with acute angina.  He was evaluated by Dr. Birdie Riddle at that time, and underwent an emergency angioplasty for coronary artery disease.  The past history otherwise is unremarkable, other than he was noted to  be hypertensive.  ALLERGIES:  No known drug allergies.  CURRENT MEDICATIONS: 1. Procardia XL 30 mg p.o. q.d. 2. Clonidine 0.2 mg p.o. t.i.d. 3. Lasix 40 mg b.i.d. 4. K-Dur 10 mEq b.i.d. 5. Lotensin 10 mg b.i.d. 6. Coumadin 5 mg q.d.  (The patients record is unavailable.)  PHYSICAL EXAMINATION:  GENERAL:  He is an ill-appearing black male, in no acute distress.  VITAL SIGNS:  Revealed blood pressure of 146/101, pulse 114, respirations 18, temperature 103.8 degrees.  O2 saturation 97% on room air.  HEENT:  Head normocephalic, atraumatic.  Extraocular muscles intact.  There is no sinus tenderness.  No scleral icterus.  Nose:  Mild turbinate edema bilaterally.  Tympanic membranes clear.  Throat:  Membranes are dry.  No posterior pharyngeal erythema.  NECK:  Supple, no enlarged thyroid.  No posterior cervical nodes.  He has mild left submental nodes, tender to palpation.  LUNGS:  Clear, without wheezes, rales, or rhonchi.  No E to A changes.  ABDOMEN:  He has mild bilateral flank tenderness to percussion.  Bowel sounds are active.  No rushes appreciated.  There is mild distention.  No epigastric tenderness.  No lower abdominal tenderness.  CARDIOVASCULAR:  He has normal S1, normal S2.  No S3.  No S4.  He has a 1/6 systolic ejection murmur in the lower left sternal border.  No  rub appreciated.  MUSCULOSKELETAL:  He has a full range of motion in all extremities.  No cyanosis or clubbing.  Minimal crepitus in the knees.  Negative Homans.  No edema.  NEUROLOGIC:  He is alert and oriented x 3.  Cranial nerves intact. Cerebellar, sensory, and motor functions grossly intact.  LABORATORY DATA:  CBC reveals a WBC of 13,600, hemoglobin 13.9, hematocrit of 40.4, 77 polys, 11% lymphs, 0% eosinophils.  Chemistry panel:  Sodium 130, potassium 4.0, chloride 93, CO2 of 25, BUN 18, creatinine 1.4, glucose 90. Calcium 9.2, total protein 8.5, albumin 3.3, SGOT 27, SGPT 22,  alkaline phosphatase 73, bilirubin total 1.7, amylase 85.  PT 13.6 seconds. Urinalysis:  A pH of 7.5, specific gravity 1.023, urobilinogen 2.0 mg per dl, otherwise negative.  Electrocardiogram pending.  IMPRESSION: 1. Acute febrile illness with diarrhea, nausea, and vomiting.  Rule out    infectious colitis.  Rule out viral gastroenteritis.  Rule out acute    hepatitis. 2. Dehydration. 3. Refractory fever, secondary to above. 4. History of coronary artery disease, status post angioplasty. 5. Hypertension. 6. Mild obesity.  PLAN:  The patient is admitted for further evaluation.  He will be placed on IV fluids for hydration.  Blood cultures have been obtained.  He is placed on IV Zosyn at this time.  Stools will be obtained for enteric pathogens, O&P, and leukocytes.  Control nausea and vomiting with oral, versus IV Phenergan at this time.  Will obtain an acute hepatitis panel as well.  Monitor on telemetry, in view of a past history of coronary artery disease.  Further therapy, pending response to the above.  Will control his fever acutely with Tylenol at this time. DD:  06/10/00 TD:  06/11/00 Job: [id number]

## 2010-10-09 NOTE — Discharge Summary (Signed)
NAME:  [name], [name]                           ACCOUNT NO.:  [id number]   MEDICAL RECORD NO.:  [id number]                   PATIENT TYPE:  INP   LOCATION:  0345                                 FACILITY:  [facility]   PHYSICIAN:  Birdie Riddle, M.D.               DATE OF BIRTH:  04-24-1961   DATE OF ADMISSION:  12/17/2001  DATE OF DISCHARGE:  12/19/2001                                 DISCHARGE SUMMARY   PRINCIPAL DIAGNOSES:  1. Congestive heart failure.  2. Dilated cardiomyopathy.  3. Status post renal cell carcinoma with right nephrectomy.  4. Right knee pain.   DISCHARGE MEDICATIONS:  1. Lasix 40 mg daily.  2. Toprol XL 50 mg daily.  3. Norvasc 10 mg daily.  4. Clonidine 0.2 mg b.i.d.  5. K-Dur 20 mEq b.i.d.  6. Diovan 160 mg one daily.   DISCHARGE ACTIVITIES:  As tolerated.   DIET:  Low fat, low salt with 1800 cc fluids restricted diet.   FOLLOW UP:  By Dr. Dixie Dials in two weeks.   HISTORY:  This 50 year old black male with a history of hypertension,  coronary artery disease, dilated cardiomyopathy had an episode of shortness  of breath.  The patient admitted to noncompliance on diet.   PHYSICAL EXAMINATION:  VITAL SIGNS:  Blood pressure 161/113.  GENERAL:  In no acute distress.  HEENT:  Throat is clear, unremarkable.  NECK:  No JVD, negative for bruit.  LUNGS:  Few rales at the bases.  HEART:  Normal S1, S2.  No S3.  ABDOMEN:  Soft and nontender.  EXTREMITIES:  No edema.  NEUROLOGIC:  No deficit.   LABORATORY DATA:  Normal CPK and borderline troponin I.  Lipid profile  unremarkable with a total cholesterol 135 and triglyceride 102, HDL  cholesterol 42.  Normal hemoglobin, hematocrit.  WBC, platelet count normal,  electrolytes:  BUN, creatinine, and liver enzymes.  Borderline albumin  level.   Echocardiogram showed dilated heart with ejection fraction of 20-25%.  EKG:  Sinus rhythm with biatrial enlargement, left ventricular hypertrophy.   HOSPITAL COURSE:   The patient was admitted to telemetry unit.  Myocardial  infarction was ruled out.  He responded very well to IV Lasix and IV  nitroglycerin.  He lost approximately 8 pounds in two days and had no  respiratory distress.  His old cardiac catheterization was reviewed which  showed minimal coronary artery disease.  His echocardiogram showed dilated  cardiomyopathy.  His heart failure is secondary to poor blood pressure  control for a long time.  Overall, patient felt better, ambulated without  any chest pain, and was discharged home in satisfactory condition and will  be followed by me in one week.  Birdie Riddle, M.D.    ASK/MEDQ  D:  01/03/2002  T:  01/05/2002  Job:  [id number]

## 2010-10-09 NOTE — Discharge Summary (Signed)
[name], [name] NO.:  [id number]   MEDICAL RECORD NO.:  [id number]          PATIENT TYPE:  INP   LOCATION:  4706                         FACILITY:  [facility]   PHYSICIAN:  Birdie Riddle, M.D.  DATE OF BIRTH:  [birth year]   DATE OF ADMISSION:  01/05/2004  DATE OF DISCHARGE:  01/10/2004                                 DISCHARGE SUMMARY   PRINCIPAL DIAGNOSES:  1.  Congestive heart failure.  2.  Gouty arthritis.  3.  Right upper and lower extremity weakness.  4.  Status post renal cell cancer with surgery.   DISCHARGE MEDICATIONS:  1.  Labetalol 200 mg one twice daily.  2.  Cogentin 0.6 mg one twice daily.  3.  Allopurinol 200 mg one daily.  4.  Oxycodone/APAP 5/325 mg one four times daily.  5.  Norvasc 10 mg one daily.  6.  Protonix 40 mg one daily.  7.  Flexeril 10 mg one half tablet three times daily.  8.  Lasix 40 mg one daily.  9.  Kay Ciel 10 mEq two daily.  10. Coumadin 5 mg one daily.  11. __________ one tablet three times daily.  12. Avapro 300 mg one daily.   DISCHARGE DIET:  Low-fat, low-salt diet.   DISCHARGE ACTIVITIES:  1.  The patient is to record daily weight on the same scale at the same time      of the day.  2.  Avoid straining.  3.  Stop any activity that causes chest pain, shortness of breath,      dizziness, sweating or excessive weakness.  4.  The patient is to continue following the __________ book.  5.  The patient is to call doctor if he gains three to four pounds of weight      or has shortness of breath or swelling of the hands, feet or stomach or      if he needs an extra pillow to sleep at night in order to breathe.   FOLLOW UP:  By Dr. Dixie Dials in one week.  The patient is to call [phone] for appointment.   CONDITION ON DISCHARGE:  Improved.   HISTORY:  This 50 year old black male with history of noncompliance  presented with chest pain, shortness of breath, leg swelling along with  weakness and neck pain.   The patient had CT of the brain done in the  emergency room that was negative for a tumor or bleed and had good diuresis  with Lasix use in the emergency room.  He has a history of hypertension for  15 years and right-sided __________ for renal cell CA in 2002.   PHYSICAL EXAMINATION:  VITAL SIGNS:  Temperature 98.6; pulse 90;  respirations 19; blood pressure 205/30; height 6 feet; weight 244 pounds.  GENERAL:  The patient was alert and oriented times two.  HEENT:  Normocephalic, atraumatic.  Brown eyes.  Conjunctivae pink.  Sclerae  nonicteric, appears sleepy.  NECK:  No JVD, no carotid bruits, but positive pain in the neck on  palpation.  LUNGS:  Clear anteriorly  with few basil crackles.  HEART:  Normal S1, S2 with grade 2/6 systolic murmur.  ABDOMEN:  Soft, nontender.  EXTREMITIES:  Trace edema with right upper and lower extremity weakness.  CNS:  Cranial nerves II-XII grossly intact, but right hand pain, weakness  more than right lower extremity weakness.   LABORATORY DATA:  Hemoglobin 12.6, hematocrit 37.8, WBC count 7.3, platelet  count 251,000.  PT 13.8, INR 1.1. Sodium 135, potassium 3.7, glucose 110,  creatinine 1.4, BUN 13, total protein 7.7.  Liver enzymes normal.  Uric acid  elevated at 10.4.  CK MB normal, troponin I 0.29 and 0.23 and 0.24.  A lipid  profile showed a cholesterol of 190, triglycerides of 103, HDL cholesterol  of 59 and LDL cholesterol of 110.  TSH was 0.86.   Chest x-ray showed cardiomegaly and pulmonary vascular congestion.  CT of  the head showed a small hyperdense area seen within the right pons.  Otherwise, negative.  X-rays of the shoulder and C spine were essentially  unremarkable with a mild degenerative disc disease at C4-5 with bilateral  foraminal narrowing without any acute abnormality.  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   EKG:  Sinus rhythm with nonspecific ST-T wave changes.  Echocardiogram  revealed a 35 to 40% ejection fraction with mild hypokinesis of entire  inferior posterior wall and mildly reduced right ventricular systolic  function and _________ inferior vena cava.   HOSPITAL COURSE:  The patient was admitted to the telemetry unit.  Due to  his neck pain and right hand weakness, numbness, neurological consult of Dr.  Leonie Man was obtained.  The patient had significant improvement with blood  pressure medications including labetalol, Avapro and diuresis with Lasix.  He also had analgesics and anti-gout medications, namely colchicine and  allopurinol.  Neurologically he had no significant abnormality, except for  cervical disc disease.  His overall condition improved gradually over the  next 72 hours.  He had mild right arm weakness, but had at least 50%  improvement in the strength and the numbness.  On August 18 his Lasix dose  was reduced for elevated creatinine and on January 10, 2004 he was discharged  home in satisfactory condition with follow-up by me in two weeks.       ASK/MEDQ  D:  02/16/2004  T:  02/17/2004  Job:  [id number]

## 2010-10-09 NOTE — Op Note (Signed)
[place]. [facility]  Patient:    [name], [name]                       MRN: [id number] Proc. Date: 06/17/00 Adm. Date:  [id number] Attending:  Hanley Ben CC:         Synthia Innocent, M.D.  Birdie Riddle, M.D.   Operative Report  PREOPERATIVE DIAGNOSIS:  ______ renal cell carcinoma and right hydronephrosis.  POSTOPERATIVE DIAGNOSIS:  ______ renal cell carcinoma and right hydronephrosis.  PROCEDURE:  Right radical nephrectomy.  SURGEON:  Hanley Ben, M.D.  ASSISTANT:  Synthia Innocent, M.D.  ANESTHESIA:  General.  INDICATIONS:  The patient is a 50 year old male, who was admitted with right flank pain, nausea, vomiting, and fever.  During the work-up, a renal ultrasound showed right hydronephrosis and a solid mass in the lower pole of the right kidney.  CT scan showed a 6.7 x 8 cm mass in the lower pole of the right kidney.  Patient was scheduled for right radical nephrectomy.  DESCRIPTION OF PROCEDURE:  Under general anesthesia, the patient was prepped and draped and placed in the supine position.  A right subcostal incision was made.  The incision was carried down to the fascia, which was then incised. The rectus muscle was incised.  The external oblique and internal oblique muscles were also incised.  The peritoneum was incised.  The peritoneal cavity was then examined.  The liver, gallbladder, and spleen were normal.  Then, a longitudinal incision was made on the right paracolic gutter and the ascending colon was then reflected medially.  The duodenum was then mobilized medially. The renal pedicle was then identified.  The renal vein was freed from the surrounding tissues.  The renal artery was found behind renal vein and was doubly ligated with #1 silk and hemoclips were also applied on the renal artery and renal artery was incised between ligatures.  Then, the renal vein was ligated with #1 silk and hemoclips and cut in between  ligatures.  Two ligatures and a hemoclip were left both on the renal artery and the renal vein stumps.  The kidney was then dissected from the surrounding tissues.  There was a solid mass in the lower pole of the right kidney and the upper pole kidney was markedly hydronephrotic.  The ureter was then freed from the surrounding tissues and ligated with #0 Vicryl and cut in between ligatures. The kidney was then dissected from the surrounding tissues in between hemoclips and removed.  Hemostasis was then completed with electrocautery and hemoclips.  There was minimal bleeding at the end of the procedure.  The colon was then placed in its normal position.  The wound was then closed in three layers with #0 PDS and the skin was closed with skin staples.  Needle, sponge and instrument counts were correct on two occasions.  Estimated blood loss - minimal.  Blood replacement - none.  The patient tolerated the procedure well and left the OR in satisfactory condition to post anesthesia care unit. DD:  06/17/00 TD:  06/17/00 Job: [id number]

## 2010-10-09 NOTE — Discharge Summary (Signed)
[name], [name]                 ACCOUNT NO.:  [id number]   MEDICAL RECORD NO.:  [id number]          PATIENT TYPE:  INP   LOCATION:  2027                         FACILITY:  [facility]   PHYSICIAN:  Birdie Riddle, M.D.  DATE OF BIRTH:  Jul 19, 1960   DATE OF ADMISSION:  07/07/2006  DATE OF DISCHARGE:  07/12/2006                               DISCHARGE SUMMARY   FINAL DIAGNOSIS:  1. Acute bronchitis.  2. Chronic systolic heart failure.  3. Hypertension.  4. Dilated cardiomyopathy.  5. Obesity.   DISCHARGE MEDICATIONS:  1. Aspirin 325 mg one daily.  2. Allopurinol 300 mg one daily.  3. Amlodipine 10 mg one daily.  4. Avapro 300 mg one daily.  5. Clonidine 0.2 mg one twice daily.  6. Lasix 40 mg one twice daily.  7. KCl 20 mEq one twice daily.  8. Oxycodone/APAP one twice daily (5/325 mg).  9. Labetalol 200 mg twice daily.  10.Zocor 20 mg one daily.  11.Robitussin DM two teaspoons four times daily as needed.  12.Colchicine 0.6 mg one twice daily as needed.  13.Lisinopril 10 mg one twice daily.  14.Cipro 500 mg one twice daily times 5 days, then discontinue.   DISCHARGE INSTRUCTIONS:  1. Activity:  The patient to increase activity slowly.  2. Diet:  Low-fat, low-salt diet with 1500 calories and 1500 mL diet.  3. Wound care not applicable.  4. Patient to follow heart failure booklet instructions.   FOLLOWUP:  Follow-up by Dr. Dixie Dials in 2 weeks.  The patient to  call [phone] for appointment.   CONDITION ON DISCHARGE:  Improved.   HISTORY:  This 50 year old black male presented with fever, cough,  shortness of breath, chest pain increased with cough.   PHYSICAL EXAMINATION:  VITAL SIGNS:  Temperature 98, pulse 78,  respirations 18, blood pressure 180/106, weight approximately 300  pounds.  Height approximately 6 feet.  HEENT:  The patient is normocephalic, atraumatic.  Pupils reactive to  light.  Extraocular movement intact.  Sclerae are clear.  Eyes brown.  NECK:   Supple.  LUNGS:  Harsh breath sounds bilaterally.  HEART:  Normal S1-S2 with grade 2/6 systolic murmur.  ABDOMEN:  Soft, nontender, but distended.  EXTREMITIES:  No edema, cyanosis, clubbing.  NEUROLOGICALLY:  Cranial nerves grossly intact   LABORATORY DATA:  Normal electrolytes, BUN, creatinine.  Sugar  borderline at 103.  Hemoglobin borderline at 11.4.  WBC count 4400,  platelet count 243,000, hemoglobin A1c 6.  Cardiac enzymes borderline  elevation with a normal CK-MB cholesterol slightly elevated at 206 with  triglyceride of 278 and LDL of 95, HDL of 55.  Gram positive cocci  suggestive of normal oral pharyngeal flora, influenza type A and B  negative   Chest x-ray showed lower lung inflation was stable cardiomegaly and  diffuse pulmonary vascular congestion.  EKG normal sinus rhythm with a  moderate voltage criteria for LVH.   HOSPITAL COURSE:  The patient was admitted to telemetry unit.  His  cardiac enzymes were slightly abnormal.  The patient had more symptoms  of bronchitis which improved with  IV antibiotic use and addition of  Zithromax his home medications were continued.  He had dietary consult  and blood pressure control improved while was in the hospital suggesting  dietary noncompliance and possible medication noncompliance outside the  hospital.  His activity was gradually increased and he was discharged  home in satisfactory condition with followup by me in 2 weeks.      Birdie Riddle, M.D.  Electronically Signed     ASK/MEDQ  D:  07/12/2006  T:  07/12/2006  Job:  [id number]

## 2010-10-09 NOTE — Discharge Summary (Signed)
[place]  Patient:    [name], [name]                       MRN: [id number] Adm. Date:  [id number] Disc. Date: [id number] Attending:  Hanley Ben                           Discharge Summary  ADMISSION PHYSICIAN:  Carolann Littler. Marlou Sa, M.D.  PRINCIPAL DIAGNOSES: 1. Abdominal pain with a right renal mass rule out renal cell cancer. 2. Hypertension. 3. Fever. 4. Dilated cardiomyopathy.  DISCHARGE MEDICATIONS: 1. Catapres 0.2 mg one twice daily. 2. Protonix 40 mg one p.o. daily. 3. Tequin 400 mg one daily. 4. Procardia XL 60 mg in the in the morning and 30 mg XL in the evening. 5. Hydrochlorothiazide 12.5 mg daily. 6. Toprol XL 25 mg daily. 7. Diovan 80 mg daily.  DISCHARGE ACTIVITY:  As tolerated.  DISCHARGE DIET:  Low-fat, low-salt diet.  FOLLOWUP:  Follow by Dr. Janice Norrie in less than one week and Dr. Doylene Canard in one month.  This patient was advised to discontinue Coumadin and aspirin in preparation for a right renal mass evaluation.  He was also advised to have surgery done as soon as possible and patient understood that he may have kidney cancer; however, he wanted to go home for personal reasons and was willing to sign out AMA if not allowed to go home.  HISTORY OF PRESENT ILLNESS:  This 50 year old black male was admitted with bilateral flank pain, some nausea, along with a temperature of 103.5.  He was initially evaluated by Dr. Kevan Ny.  It appeared the patient had a viral syndrome versus colitis and he was admitted for additional workup and for IV hydration.  PHYSICAL EXAMINATION:  VITAL SIGNS:  Temperature 103.8, pulse 114, respirations 18, blood pressure 146/101.  HEENT:  Head normocephalic, atraumatic with extraocular movements intact.  No sinus tenderness.  Sclerae are nonicteric.  Throat: Mucous membranes dry.  No posteropharyngeal ______ .  NECK:  Supple, with no enlarged thyroid, no posterior cervical lymph nodes. Patient  had mild left submental node tender on palpation.  LUNGS:  Without wheezes, rales, or rhonchi.  CARDIOVASCULAR:  Had normal S1, S2, without S3 or S4.  Had a 1/6 systolic murmur in the left sternal border.  ABDOMEN:  Mild bilateral flank tenderness on percussion.  Bowel sounds active. No epigastric tenderness.  No lower abdominal tenderness.  There was mild distention of abdomen.  MUSCULOSKELETAL:  Patient had full range of motion in lower extremities without cyanosis or clubbing.  Negative Homans sign.  Negative edema.  NEUROLOGY:  Patient was alert, oriented x 3, with cranial nerves intact and sensory and motor function grossly intact.  LABORATORY DATA:  WBC count 13,600, hemoglobin 13.9, hematocrit 40.4.  Sodium 130, potassium 4, chloride 93, CO2 25, BUN 18, creatinine 1.4, glucose 90, bilirubin 1.7, amylase 85.  PT 13.6.  Urinalysis showed pH 7.5, specific gravity 1.023, urobilinogen 2 mg/dL, otherwise negative.  EKG showed normal sinus rhythm with nonspecific T-wave change.  X-ray of the chest revealed no acute disease and chronic thyromegaly.  Ultrasound of the abdomen revealed 6.7 cm inhomogeneous solid mass in the lower pole of the left kidney worrisome for carcinoma.  CT of the abdomen and pelvis revealed 7 x 7.5 cm mass extending off the lower right kidney likely representing renal cell carcinoma.  Right adrenal gland and right renal  vein are unremarkable.  There is no evidence of ______ lymphadenopathy.  CT of the pelvis was unremarkable.  HOSPITAL COURSE:  Patient was admitted to telemetry unit.  He was started on IV antibiotics after blood cultures, IV Zosyn was started at 3.375 g q.6h.  He received Phenergan, Dilaudid, and Tylenol for his abdominal pain and fever. His home medications of Procardia, Lotensin, and clonidine were restarted. Next day his antibiotic was changed to Cipro 400 mg IV q.12h.  Stool was sent for Salmonella/Shigella.  The ultrasound of the  abdomen showed a right renal mass and this was evaluated with CT of the abdomen.  Blood pressure medications were adjusted to control his hypertension.  Although he was started on Coumadin initially this was discontinued in anticipation for renal surgery.  Dr. Janice Norrie was consulted.  Patient for personal reasons wanted to postpone the surgery, hence, he was discharged home in satisfactory condition with a followup by Dr. Janice Norrie in less than one week. DD:  07/22/00 TD:  07/25/00 Job: [id number]

## 2010-11-05 ENCOUNTER — Emergency Department (HOSPITAL_COMMUNITY): Payer: BC Managed Care – PPO

## 2010-11-05 ENCOUNTER — Emergency Department (HOSPITAL_COMMUNITY)
Admission: EM | Admit: 2010-11-05 | Discharge: 2010-11-05 | Disposition: A | Discharge status: Home / Self Care | Payer: BC Managed Care – PPO | Attending: Emergency Medicine | Admitting: Emergency Medicine

## 2010-11-05 ENCOUNTER — Encounter (HOSPITAL_COMMUNITY): Payer: Self-pay | Admitting: Radiology

## 2010-11-05 DIAGNOSIS — Z79899 Other long term (current) drug therapy: Secondary | ICD-10-CM | POA: Insufficient documentation

## 2010-11-05 DIAGNOSIS — R10813 Right lower quadrant abdominal tenderness: Secondary | ICD-10-CM | POA: Insufficient documentation

## 2010-11-05 DIAGNOSIS — Z9889 Other specified postprocedural states: Secondary | ICD-10-CM | POA: Insufficient documentation

## 2010-11-05 DIAGNOSIS — E860 Dehydration: Secondary | ICD-10-CM | POA: Insufficient documentation

## 2010-11-05 DIAGNOSIS — N289 Disorder of kidney and ureter, unspecified: Secondary | ICD-10-CM | POA: Insufficient documentation

## 2010-11-05 DIAGNOSIS — I509 Heart failure, unspecified: Secondary | ICD-10-CM | POA: Insufficient documentation

## 2010-11-05 DIAGNOSIS — I10 Essential (primary) hypertension: Secondary | ICD-10-CM | POA: Insufficient documentation

## 2010-11-05 DIAGNOSIS — E119 Type 2 diabetes mellitus without complications: Secondary | ICD-10-CM | POA: Insufficient documentation

## 2010-11-05 DIAGNOSIS — M129 Arthropathy, unspecified: Secondary | ICD-10-CM | POA: Insufficient documentation

## 2010-11-05 DIAGNOSIS — E669 Obesity, unspecified: Secondary | ICD-10-CM | POA: Insufficient documentation

## 2010-11-05 HISTORY — DX: Acquired absence of kidney: Z90.5

## 2010-11-05 HISTORY — DX: Heart failure, unspecified: I50.9

## 2010-11-05 LAB — BASIC METABOLIC PANEL WITH GFR
BUN: 32 mg/dL — ABNORMAL HIGH (ref 6–23)
CO2: 24 meq/L (ref 19–32)
Calcium: 8.7 mg/dL (ref 8.4–10.5)
Chloride: 104 meq/L (ref 96–112)
Creatinine, Ser: 3.79 mg/dL — ABNORMAL HIGH (ref 0.4–1.5)
GFR calc Af Amer: 21 mL/min — ABNORMAL LOW
GFR calc non Af Amer: 17 mL/min — ABNORMAL LOW
Glucose, Bld: 89 mg/dL (ref 70–99)
Potassium: 4 meq/L (ref 3.5–5.1)
Sodium: 137 meq/L (ref 135–145)

## 2010-11-05 LAB — URINALYSIS, ROUTINE W REFLEX MICROSCOPIC
Bilirubin Urine: NEGATIVE
Glucose, UA: NEGATIVE mg/dL
Ketones, ur: NEGATIVE mg/dL
Leukocytes, UA: NEGATIVE
Nitrite: NEGATIVE
Protein, ur: >300 mg/dL — AB
Specific Gravity, Urine: 1.018 (ref 1.005–1.030)
Urobilinogen, UA: 0.2 mg/dL (ref 0.0–1.0)
pH: 6 (ref 5.0–8.0)

## 2010-11-05 LAB — BASIC METABOLIC PANEL
BUN: 29 mg/dL — ABNORMAL HIGH (ref 6–23)
CO2: 23 mEq/L (ref 19–32)
Calcium: 8.3 mg/dL — ABNORMAL LOW (ref 8.4–10.5)
Chloride: 107 mEq/L (ref 96–112)
Creatinine, Ser: 3.4 mg/dL — ABNORMAL HIGH (ref 0.4–1.5)
GFR calc Af Amer: 24 mL/min — ABNORMAL LOW (ref >60)
GFR calc non Af Amer: 20 mL/min — ABNORMAL LOW (ref >60)
Glucose, Bld: 119 mg/dL — ABNORMAL HIGH (ref 70–99)
Potassium: 4.1 mEq/L (ref 3.5–5.1)
Sodium: 138 mEq/L (ref 135–145)

## 2010-11-05 LAB — GLUCOSE, CAPILLARY: Glucose-Capillary: 84 mg/dL (ref 70–99)

## 2010-11-05 LAB — URINE MICROSCOPIC-ADD ON

## 2010-11-05 LAB — DIFFERENTIAL
Basophils Absolute: 0 10*3/uL (ref 0.0–0.1)
Basophils Relative: 0 % (ref 0–1)
Eosinophils Absolute: 0.1 10*3/uL (ref 0.0–0.7)
Eosinophils Relative: 3 % (ref 0–5)
Lymphocytes Relative: 27 % (ref 12–46)
Lymphs Abs: 1.2 10*3/uL (ref 0.7–4.0)
Monocytes Absolute: 0.5 10*3/uL (ref 0.1–1.0)
Monocytes Relative: 11 % (ref 3–12)
Neutro Abs: 2.7 10*3/uL (ref 1.7–7.7)
Neutrophils Relative %: 59 % (ref 43–77)

## 2010-11-05 LAB — CBC
HCT: 31.9 % — ABNORMAL LOW (ref 39.0–52.0)
Hemoglobin: 10.9 g/dL — ABNORMAL LOW (ref 13.0–17.0)
MCH: 29.2 pg (ref 26.0–34.0)
MCHC: 34.2 g/dL (ref 30.0–36.0)
MCV: 85.5 fL (ref 78.0–100.0)
Platelets: 184 10*3/uL (ref 150–400)
RBC: 3.73 MIL/uL — ABNORMAL LOW (ref 4.22–5.81)
RDW: 14.3 % (ref 11.5–15.5)
WBC: 4.6 10*3/uL (ref 4.0–10.5)

## 2010-11-30 ENCOUNTER — Emergency Department (HOSPITAL_COMMUNITY): Payer: BC Managed Care – PPO

## 2010-11-30 ENCOUNTER — Inpatient Hospital Stay (HOSPITAL_COMMUNITY)
Admission: EM | Admit: 2010-11-30 | Discharge: 2010-12-05 | DRG: 544 | Disposition: A | Discharge status: Home / Self Care | Payer: BC Managed Care – PPO | Source: Ambulatory Visit | Attending: Internal Medicine | Admitting: Internal Medicine

## 2010-11-30 DIAGNOSIS — G894 Chronic pain syndrome: Secondary | ICD-10-CM | POA: Diagnosis present

## 2010-11-30 DIAGNOSIS — I2589 Other forms of chronic ischemic heart disease: Secondary | ICD-10-CM | POA: Diagnosis present

## 2010-11-30 DIAGNOSIS — I2510 Atherosclerotic heart disease of native coronary artery without angina pectoris: Secondary | ICD-10-CM | POA: Diagnosis present

## 2010-11-30 DIAGNOSIS — N179 Acute kidney failure, unspecified: Secondary | ICD-10-CM | POA: Diagnosis present

## 2010-11-30 DIAGNOSIS — N2581 Secondary hyperparathyroidism of renal origin: Secondary | ICD-10-CM | POA: Diagnosis present

## 2010-11-30 DIAGNOSIS — I509 Heart failure, unspecified: Secondary | ICD-10-CM | POA: Diagnosis present

## 2010-11-30 DIAGNOSIS — Z7982 Long term (current) use of aspirin: Secondary | ICD-10-CM

## 2010-11-30 DIAGNOSIS — N184 Chronic kidney disease, stage 4 (severe): Secondary | ICD-10-CM | POA: Diagnosis present

## 2010-11-30 DIAGNOSIS — Z905 Acquired absence of kidney: Secondary | ICD-10-CM

## 2010-11-30 DIAGNOSIS — M109 Gout, unspecified: Secondary | ICD-10-CM | POA: Diagnosis present

## 2010-11-30 DIAGNOSIS — E119 Type 2 diabetes mellitus without complications: Secondary | ICD-10-CM | POA: Diagnosis present

## 2010-11-30 DIAGNOSIS — I5021 Acute systolic (congestive) heart failure: Principal | ICD-10-CM | POA: Diagnosis present

## 2010-11-30 DIAGNOSIS — M549 Dorsalgia, unspecified: Secondary | ICD-10-CM | POA: Diagnosis present

## 2010-11-30 DIAGNOSIS — I129 Hypertensive chronic kidney disease with stage 1 through stage 4 chronic kidney disease, or unspecified chronic kidney disease: Secondary | ICD-10-CM | POA: Diagnosis present

## 2010-11-30 LAB — COMPREHENSIVE METABOLIC PANEL
ALT: 19 U/L (ref 0–53)
AST: 19 U/L (ref 0–37)
Albumin: 3 g/dL — ABNORMAL LOW (ref 3.5–5.2)
Alkaline Phosphatase: 96 U/L (ref 39–117)
BUN: 29 mg/dL — ABNORMAL HIGH (ref 6–23)
CO2: 22 mEq/L (ref 19–32)
Calcium: 9.2 mg/dL (ref 8.4–10.5)
Chloride: 107 mEq/L (ref 96–112)
Creatinine, Ser: 3.87 mg/dL — ABNORMAL HIGH (ref 0.50–1.35)
GFR calc Af Amer: 20 mL/min — ABNORMAL LOW (ref >60)
GFR calc non Af Amer: 17 mL/min — ABNORMAL LOW (ref >60)
Glucose, Bld: 75 mg/dL (ref 70–99)
Potassium: 4.1 mEq/L (ref 3.5–5.1)
Sodium: 138 mEq/L (ref 135–145)
Total Bilirubin: 0.4 mg/dL (ref 0.3–1.2)
Total Protein: 6.7 g/dL (ref 6.0–8.3)

## 2010-11-30 LAB — CBC
HCT: 34.7 % — ABNORMAL LOW (ref 39.0–52.0)
Hemoglobin: 11.9 g/dL — ABNORMAL LOW (ref 13.0–17.0)
MCH: 29.7 pg (ref 26.0–34.0)
MCHC: 34.3 g/dL (ref 30.0–36.0)
MCV: 86.5 fL (ref 78.0–100.0)
Platelets: 237 10*3/uL (ref 150–400)
RBC: 4.01 MIL/uL — ABNORMAL LOW (ref 4.22–5.81)
RDW: 14.9 % (ref 11.5–15.5)
WBC: 5 10*3/uL (ref 4.0–10.5)

## 2010-11-30 LAB — GLUCOSE, CAPILLARY
Glucose-Capillary: 83 mg/dL (ref 70–99)
Glucose-Capillary: 108 mg/dL — ABNORMAL HIGH (ref 70–99)

## 2010-11-30 LAB — POCT I-STAT 3, ART BLOOD GAS (G3+)
Acid-base deficit: 2 mmol/L (ref 0.0–2.0)
Bicarbonate: 24.9 mEq/L — ABNORMAL HIGH (ref 20.0–24.0)
O2 Saturation: 100 %
TCO2: 26 mmol/L (ref 0–100)
pCO2 arterial: 50 mmHg — ABNORMAL HIGH (ref 35.0–45.0)
pH, Arterial: 7.305 — ABNORMAL LOW (ref 7.350–7.450)
pO2, Arterial: 188 mmHg — ABNORMAL HIGH (ref 80.0–100.0)

## 2010-11-30 LAB — PROTIME-INR
INR: 0.97 (ref 0.00–1.49)
Prothrombin Time: 13.1 seconds (ref 11.6–15.2)

## 2010-11-30 LAB — CARDIAC PANEL(CRET KIN+CKTOT+MB+TROPI)
CK, MB: 4.7 ng/mL — ABNORMAL HIGH (ref 0.3–4.0)
CK, MB: 4.4 ng/mL — ABNORMAL HIGH (ref 0.3–4.0)
Relative Index: 2.1 (ref 0.0–2.5)
Relative Index: 2 (ref 0.0–2.5)
Total CK: 224 U/L (ref 7–232)
Total CK: 218 U/L (ref 7–232)
Troponin I: <0.3 ng/mL (ref <0.30)
Troponin I: <0.3 ng/mL (ref <0.30)

## 2010-11-30 LAB — MRSA PCR SCREENING: MRSA by PCR: NEGATIVE

## 2010-11-30 LAB — MAGNESIUM: Magnesium: 2.1 mg/dL (ref 1.5–2.5)

## 2010-11-30 LAB — PRO B NATRIURETIC PEPTIDE: Pro B Natriuretic peptide (BNP): 1114 pg/mL — ABNORMAL HIGH (ref 0–125)

## 2010-11-30 LAB — PHOSPHORUS: Phosphorus: 3.8 mg/dL (ref 2.3–4.6)

## 2010-11-30 LAB — APTT: aPTT: 41 seconds — ABNORMAL HIGH (ref 24–37)

## 2010-11-30 LAB — TROPONIN I: Troponin I: <0.3 ng/mL (ref <0.30)

## 2010-12-01 ENCOUNTER — Inpatient Hospital Stay (HOSPITAL_COMMUNITY): Payer: BC Managed Care – PPO

## 2010-12-01 DIAGNOSIS — I059 Rheumatic mitral valve disease, unspecified: Secondary | ICD-10-CM

## 2010-12-01 DIAGNOSIS — R0602 Shortness of breath: Secondary | ICD-10-CM

## 2010-12-01 LAB — RENAL FUNCTION PANEL
Albumin: 2.6 g/dL — ABNORMAL LOW (ref 3.5–5.2)
BUN: 38 mg/dL — ABNORMAL HIGH (ref 6–23)
CO2: 26 mEq/L (ref 19–32)
Calcium: 9 mg/dL (ref 8.4–10.5)
Chloride: 104 mEq/L (ref 96–112)
Creatinine, Ser: 4.63 mg/dL — ABNORMAL HIGH (ref 0.50–1.35)
GFR calc Af Amer: 17 mL/min — ABNORMAL LOW (ref >60)
GFR calc non Af Amer: 14 mL/min — ABNORMAL LOW (ref >60)
Glucose, Bld: 91 mg/dL (ref 70–99)
Phosphorus: 5.6 mg/dL — ABNORMAL HIGH (ref 2.3–4.6)
Potassium: 4.5 mEq/L (ref 3.5–5.1)
Sodium: 138 mEq/L (ref 135–145)

## 2010-12-01 LAB — GLUCOSE, CAPILLARY
Glucose-Capillary: 112 mg/dL — ABNORMAL HIGH (ref 70–99)
Glucose-Capillary: 92 mg/dL (ref 70–99)
Glucose-Capillary: 102 mg/dL — ABNORMAL HIGH (ref 70–99)
Glucose-Capillary: 101 mg/dL — ABNORMAL HIGH (ref 70–99)

## 2010-12-01 LAB — TSH: TSH: 2.873 u[IU]/mL (ref 0.350–4.500)

## 2010-12-01 LAB — HEMOGLOBIN A1C
Hgb A1c MFr Bld: 5.5 % (ref <5.7)
Mean Plasma Glucose: 111 mg/dL (ref <117)

## 2010-12-01 MED ORDER — TECHNETIUM TO 99M ALBUMIN AGGREGATED
6.0000 | Freq: Once | INTRAVENOUS | Status: AC | PRN
  Administered 2010-12-01: 6 via INTRAVENOUS

## 2010-12-01 MED ORDER — XENON XE 133 GAS
10.0000 | GAS_FOR_INHALATION | Freq: Once | RESPIRATORY_TRACT | Status: AC | PRN
  Administered 2010-12-01: 10 via RESPIRATORY_TRACT

## 2010-12-01 NOTE — Consult Note (Signed)
NAMECAMRON, [name] NO.:  [id number]  MEDICAL RECORD NO.:  [id number]  LOCATION:  [id number]                         FACILITY:  [facility]  PHYSICIAN:  Caren Griffins B. Lorrene Reid, M.D.DATE OF BIRTH:  1960/11/19  DATE OF CONSULTATION: DATE OF DISCHARGE:                                CONSULTATION   We were asked to see this patient secondary to chronic kidney disease in the setting of a known solitary kidney with nephrotic range proteinuria who was admitted with worsening shortness of breath, weight gain, and edema as well as complaints of diarrhea and back pain.  He is a 50 year old [place] American man who has known chronic kidney disease.  He has been followed in the clinic by Dr. Mercy Moore since 2010.  His most recent serum creatinine in the office on Oct 09, 2010, was 3.76.  He has a solitary kidney, having undergone a right nephrectomy in 2002, because of renal cell cancer.  He has had longstanding proteinuria in the nephrotic range with a spot urine protein of 5.5 g on Oct 09, 2010.  He discontinued his Lasix 2-3 weeks ago and is very unclear who told him to do so.  He says he was told by someone he was "getting a little dehydrated" and that Dr. Mercy Moore told him to "take a little holiday from his Lasix" - however, Dr. Mercy Moore has no knowledge of this.  He was in the emergency department on November 05, 2010, complaining of abdominal pain and this may be where he got the "dehydration" story because he did receive IV fluids during that over night stay in the ER and it appears from the notes from the ER physician that it was recommended by the nephrologist on-call that he discontinue his oral potassium chloride supplements, but no mention is made of his Lasix.  All this being said, he has gained somewhere between 10 and 20 pounds, he is not quite sure how much, has had reduced urine output and worsening lower extremity edema.  He comes into the emergency room  today complaining of shortness of breath requiring BiPAP and also is complaining of pain in between the shoulder blades and 2-3 days of diarrhea.  He has had no fever or chills.  No hemoptysis.  No purulent sputum.  He has not noted any blood in his stool.  PAST MEDICAL HISTORY: 1. 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 2. History of renal cell cancer status post right nephrectomy in 2002. 3. Morbid obesity. 4. Hypertension. 5. Noninsulin-requiring type 2 diabetes. 6. History of systolic heart failure with admission in May of 2011,     showing a 25-30% ejection fraction with a dilated cardiomyopathy;     his cardiomyopathy has been attributed to hypertensive heart     disease as he had a cath in 2004, that showed only mild disease of     the LAD. 7. Gout, on allopurinol and colchicine. 8. Dyslipidemia. 9. Medication compliance issues (on review of Dr. Etheleen Nicks notes     from the office it appears that the patient is often somewhat     unsure as to exactly how much of his antihypertensive medications  he is taking and has been intolerant of large doses of lasix in the     past, although the patient cannot really tell me why at the present     time). 10.Secondary hyperparathyroidism.  His outpatient medications according to our office records include: 1. Clonidine 0.2 mg t.i.d. 2. Colchicine 0.6 mg a day. 3. Labetalol 600 mg t.i.d. 4. Simvastatin 20 mg a day. 5. Oxycodone/APAP as needed. 6. Allopurinol 300 mg a day. 7. Lasix 80 mg in the morning and 40 in the evening, WHICH HE HAS NOT     BEEN TAKING FOR 2-3 WEEKS. 8. Amlodipine 10 mg a day. 9. Calcitriol 0.5 mcg per day. 10.Aspirin 81 mg a day. 11.He takes either Avapro 300 mg a day or Benicar whichever he has     samples of. 12.Also takes glipizide 2.5 mg a day.  He is intolerant to ACE INHIBITOR which causes a cough.  FAMILY HISTORY:  Positive for hypertension and stone  disease.  SOCIAL HISTORY:  The patient is married, has 2 children, has a remote history of cocaine use.  Does not smoke, but does use some alcohol.  REVIEW OF SYSTEMS:  Positive for PND, orthopnea, and progressive shortness of breath with a 10 plus pound weight gain over the past couple of weeks.  He also notes some infrascapular mid back pain and diarrhea for 2-3 days.  He is noted decrease in his urine output.  He has had a cough, but no sputum.  He has chronic bilateral knee pain and chronic lower extremity cramps.  PHYSICAL EXAMINATION:  GENERAL/VITAL SIGNS:  Reveals a morbidly obese black male on BiPAP.  He is unable to provide much additional history because of shortness of breath.  He is afebrile.  Blood pressure is 160/100.  His neck veins are not visualizable due to his large body habitus.  Heart sounds are distant. CARDIAC:  Rhythm is regular, S1 and S2.  No audible S3.  He has crackles a third to half way up both lung fields posteriorly. ABDOMEN:  Obese.  There is no focal tenderness noted.  He has a very large pannus. EXTREMITIES:  Showed 3+ pretibial edema pitting to the knees bilaterally.  LABORATORY DATA:  Sodium 138, potassium 4.1, chloride 107, CO2 of 22, BUN 29, creatinine 3.87 (3.76 on Oct 09, 2010).  Albumin is 3, calcium 9.2, hemoglobin 11.9, WBC 5000, platelets 237,000.  BNP 1114, magnesium 2.1, troponin less than 0.3.  His arterial blood gas done on BiPAP showed a pH of 7.3, pO2 of 188, pCO2 of 50.  Chest x-ray shows massive cardiomegaly and to my eyes, shows some interstitial edema, although it is read as emphysematous changes by the radiologist.  IMPRESSION: 1. Acute pulmonary edema and systolic heart failure essentially     secondary to "Lasix deficiency" as he discontinued his Lasix for     unclear reasons a couple of weeks ago.  I can find no documentation     of anyone telling him to stop his Lasix, although he was told in     the emergency room back  in June to stop his potassium supplements.     In any event, he will be treated with intravenous Lasix and monitor     with daily weights, strict I and O, and weaning of his BiPAP as     tolerated.  Agree with following up an echo to see if there has     been further reduction in his ejection fraction. 2.  Hypertensive heart disease with nonischemic cardiomyopathy, see #1. 3. Chronic kidney disease, stage IV in the setting of a solitary     kidney post nephrectomy for renal cell cancer with underlying     nephrotic range proteinuria.  His serum creatinine at the present     time is near baseline.  I would however hold his Avapro while     diuresing him as his creatinine is likely to increase with the     diuresis and we do not want to confuse the issue with the ARB on     board. 4. Uncontrolled hypertension.  Resume home medications.  Diuresis     should help with this as well. 5. Secondary hyperparathyroidism.  Resume home dose of calcitriol and     check phosphorus. 6. Back pain and diarrhea per Primary Service. 7. Diabetes on chronic glimepiride at home.  Sliding scale insulin has     been ordered by the Primary Service. 8. Gout on allopurinol and colchicine.  Med rec shows 0.6 mg b.i.d.     and he had been asked to decrease that to once a day on his last     office visit with Dr. Mercy Moore.  Thanks for asking [place] to see him and we will follow along closely with you.     Elzie Rings Lorrene Reid, M.D.     CBD/MEDQ  D:  11/30/2010  T:  12/01/2010  Job:  [id number]  Electronically Signed by Jamal Maes M.D. on 12/01/2010 03:35:08 PM

## 2010-12-02 LAB — RENAL FUNCTION PANEL
Albumin: 2.6 g/dL — ABNORMAL LOW (ref 3.5–5.2)
BUN: 43 mg/dL — ABNORMAL HIGH (ref 6–23)
CO2: 25 mEq/L (ref 19–32)
Calcium: 8.7 mg/dL (ref 8.4–10.5)
Chloride: 101 mEq/L (ref 96–112)
Creatinine, Ser: 4.95 mg/dL — ABNORMAL HIGH (ref 0.50–1.35)
GFR calc Af Amer: 15 mL/min — ABNORMAL LOW (ref >60)
GFR calc non Af Amer: 13 mL/min — ABNORMAL LOW (ref >60)
Glucose, Bld: 118 mg/dL — ABNORMAL HIGH (ref 70–99)
Phosphorus: 5.9 mg/dL — ABNORMAL HIGH (ref 2.3–4.6)
Potassium: 4 mEq/L (ref 3.5–5.1)
Sodium: 135 mEq/L (ref 135–145)

## 2010-12-02 LAB — GLUCOSE, CAPILLARY
Glucose-Capillary: 95 mg/dL (ref 70–99)
Glucose-Capillary: 114 mg/dL — ABNORMAL HIGH (ref 70–99)
Glucose-Capillary: 122 mg/dL — ABNORMAL HIGH (ref 70–99)

## 2010-12-03 LAB — CBC
HCT: 30.5 % — ABNORMAL LOW (ref 39.0–52.0)
Hemoglobin: 10.4 g/dL — ABNORMAL LOW (ref 13.0–17.0)
MCH: 29.2 pg (ref 26.0–34.0)
MCHC: 34.1 g/dL (ref 30.0–36.0)
MCV: 85.7 fL (ref 78.0–100.0)
Platelets: 197 10*3/uL (ref 150–400)
RBC: 3.56 MIL/uL — ABNORMAL LOW (ref 4.22–5.81)
RDW: 14.5 % (ref 11.5–15.5)
WBC: 4.2 10*3/uL (ref 4.0–10.5)

## 2010-12-03 LAB — RENAL FUNCTION PANEL
Albumin: 2.5 g/dL — ABNORMAL LOW (ref 3.5–5.2)
BUN: 50 mg/dL — ABNORMAL HIGH (ref 6–23)
CO2: 25 mEq/L (ref 19–32)
Calcium: 8.7 mg/dL (ref 8.4–10.5)
Chloride: 100 mEq/L (ref 96–112)
Creatinine, Ser: 5.04 mg/dL — ABNORMAL HIGH (ref 0.50–1.35)
GFR calc Af Amer: 15 mL/min — ABNORMAL LOW (ref >60)
GFR calc non Af Amer: 12 mL/min — ABNORMAL LOW (ref >60)
Glucose, Bld: 97 mg/dL (ref 70–99)
Phosphorus: 5.6 mg/dL — ABNORMAL HIGH (ref 2.3–4.6)
Potassium: 4.2 mEq/L (ref 3.5–5.1)
Sodium: 135 mEq/L (ref 135–145)

## 2010-12-03 LAB — GLUCOSE, CAPILLARY
Glucose-Capillary: 80 mg/dL (ref 70–99)
Glucose-Capillary: 99 mg/dL (ref 70–99)
Glucose-Capillary: 109 mg/dL — ABNORMAL HIGH (ref 70–99)
Glucose-Capillary: 110 mg/dL — ABNORMAL HIGH (ref 70–99)
Glucose-Capillary: 85 mg/dL (ref 70–99)

## 2010-12-04 LAB — LIPID PANEL
Cholesterol: 174 mg/dL (ref 0–200)
HDL: 57 mg/dL (ref >39)
LDL Cholesterol: 89 mg/dL (ref 0–99)
Total CHOL/HDL Ratio: 3.1 RATIO
Triglycerides: 139 mg/dL (ref <150)
VLDL: 28 mg/dL (ref 0–40)

## 2010-12-04 LAB — GLUCOSE, CAPILLARY
Glucose-Capillary: 98 mg/dL (ref 70–99)
Glucose-Capillary: 106 mg/dL — ABNORMAL HIGH (ref 70–99)
Glucose-Capillary: 89 mg/dL (ref 70–99)
Glucose-Capillary: 101 mg/dL — ABNORMAL HIGH (ref 70–99)

## 2010-12-04 LAB — BASIC METABOLIC PANEL
BUN: 51 mg/dL — ABNORMAL HIGH (ref 6–23)
CO2: 26 mEq/L (ref 19–32)
Calcium: 9.1 mg/dL (ref 8.4–10.5)
Chloride: 100 mEq/L (ref 96–112)
Creatinine, Ser: 5.01 mg/dL — ABNORMAL HIGH (ref 0.50–1.35)
GFR calc Af Amer: 15 mL/min — ABNORMAL LOW (ref >60)
GFR calc non Af Amer: 12 mL/min — ABNORMAL LOW (ref >60)
Glucose, Bld: 93 mg/dL (ref 70–99)
Potassium: 4.4 mEq/L (ref 3.5–5.1)
Sodium: 134 mEq/L — ABNORMAL LOW (ref 135–145)

## 2010-12-04 LAB — MAGNESIUM: Magnesium: 2.1 mg/dL (ref 1.5–2.5)

## 2010-12-04 LAB — CBC
HCT: 30.4 % — ABNORMAL LOW (ref 39.0–52.0)
Hemoglobin: 10.3 g/dL — ABNORMAL LOW (ref 13.0–17.0)
MCH: 28.8 pg (ref 26.0–34.0)
MCHC: 33.9 g/dL (ref 30.0–36.0)
MCV: 84.9 fL (ref 78.0–100.0)
Platelets: 199 10*3/uL (ref 150–400)
RBC: 3.58 MIL/uL — ABNORMAL LOW (ref 4.22–5.81)
RDW: 14.7 % (ref 11.5–15.5)
WBC: 3.6 10*3/uL — ABNORMAL LOW (ref 4.0–10.5)

## 2010-12-04 LAB — PRO B NATRIURETIC PEPTIDE: Pro B Natriuretic peptide (BNP): 526.4 pg/mL — ABNORMAL HIGH (ref 0–125)

## 2010-12-04 LAB — PHOSPHORUS: Phosphorus: 5.7 mg/dL — ABNORMAL HIGH (ref 2.3–4.6)

## 2010-12-05 LAB — RENAL FUNCTION PANEL
Albumin: 2.9 g/dL — ABNORMAL LOW (ref 3.5–5.2)
BUN: 56 mg/dL — ABNORMAL HIGH (ref 6–23)
CO2: 25 mEq/L (ref 19–32)
Calcium: 9.3 mg/dL (ref 8.4–10.5)
Chloride: 98 mEq/L (ref 96–112)
Creatinine, Ser: 4.97 mg/dL — ABNORMAL HIGH (ref 0.50–1.35)
GFR calc Af Amer: 15 mL/min — ABNORMAL LOW (ref >60)
GFR calc non Af Amer: 13 mL/min — ABNORMAL LOW (ref >60)
Glucose, Bld: 106 mg/dL — ABNORMAL HIGH (ref 70–99)
Phosphorus: 6 mg/dL — ABNORMAL HIGH (ref 2.3–4.6)
Potassium: 4.9 mEq/L (ref 3.5–5.1)
Sodium: 133 mEq/L — ABNORMAL LOW (ref 135–145)

## 2010-12-05 LAB — GLUCOSE, CAPILLARY: Glucose-Capillary: 94 mg/dL (ref 70–99)

## 2010-12-07 LAB — GLUCOSE, CAPILLARY: Glucose-Capillary: 84 mg/dL (ref 70–99)

## 2010-12-07 NOTE — H&P (Signed)
[name], [name] NO.:  [id number]  MEDICAL RECORD NO.:  [id number]  LOCATION:  [id number]                         FACILITY:  [facility]  PHYSICIAN:  Redford Behrle, DO         DATE OF BIRTH:  [birth year]  DATE OF ADMISSION:  11/30/2010 DATE OF DISCHARGE:                             HISTORY & PHYSICAL   CHIEF COMPLAINT:  Shortness of breath and back pain.  HISTORY OF PRESENT ILLNESS:  The patient is a 50 year old male presents to the emergency department with a several-day history of weight gain, worsening edema and some shortness of breath.  He woke this morning with what he describes as severe shortness of breath.  He states he cannot breathe if he lies back or lies down.  He breathes the best when he is sitting up.  The patient also states that to take a deep breath causes him to have pain in the center of his back.  He denies any fever, cough, sputum production and he denies chest pain.  He states he is also lately been having no problem whereby he eats a meal and like immediately has a bowel movement.  He states that his Lasix was stopped several days ago by one of his doctors because they said he was getting dehydrated.  The patient was on quite a bit of Lasix.  Back in February 2011 he was on Lasix 80 mg 1 p.o. b.i.d. and in May that was decreased to 80 mg in the morning and 40 mg in the evening on a p.r.n. basis.  PAST MEDICAL HISTORY:  Significant for congestive heart failure with an ejection fraction of 25-30%, ischemic cardiomyopathy, coronary artery disease status post stents, hypertension, diabetes mellitus.  The patient states that he has had a problem with low blood sugars for the last week, arthritis, gout, obesity, renal cell carcinoma and chronic pain.  PAST SURGICAL HISTORY:  Significant for left heart cath and stent and right nephrectomy.  SOCIAL HISTORY:  No tobacco.  The patient drinks alcohol socially.  He states he used to drink quite heavily  3 years ago but he stopped doing that.  Denies recreational drug use.  FAMILY HISTORY:  Positive only for hypertension in his mother.  REVIEW OF SYSTEMS:  CONSTITUTIONAL:  Negative for fever, negative for chills, Positive for weakness, positive for fatigue.  CNS:  No headaches, no seizures, no limb weakness.  ENT:  No nasal congestion, throat pain or coryza.  CARDIOVASCULAR:  Negative for chest pain. Negative for palpitations.  Positive for orthopnea.  RESPIRATORY: Positive for shortness of breath.  Negative for cough.  Negative for wheeze.  GASTROINTESTINAL:  No nausea, no vomiting.  No constipation. Positive for what the patient describes as diarrhea.  I am not sure that it meets criteria for diarrhea but he has a bowel movement quickly after each meal.  GENITOURINARY:  No dysuria, hematuria, no urinary frequency. RENAL:  Positive for increased lower extremity edema.  No flank pain. No pruritus.  SKIN:  No rashes.  No sores.  No lesions.  HEMATOLOGICAL: No easy bruising, no purpura, no clots.  LYMPHS:  No lymphadenopathy. No painful nodes.  No specific lymph swelling.  PSYCHIATRIC:  No anxiety.  No depression.  No insomnia.  MEDICATIONS AT HOME:  This is as per the patient's list; 1. Clonidine 0.2 mg 1 p.o. t.i.d. 2. Colchicine 0.6 mg 1 p.o. b.i.d. 3. Labetalol 600 mg 1 p.o. t.i.d. 4. Simvastatin 20 mg 1 p.o. daily. 5. Nitroglycerin 0.4 mg 1 p.o. q.5 minutes x3 p.r.n. chest pain. 6. Percocet 5/325 one p.o. q.4 h. p.r.n. pain. 7. Allopurinol 300 mg 1 p.o. daily. 8. Benicar 40 mg 1 p.o. daily. 9. Lasix.  Again he has recently been told to stop the Lasix about 1     week ago but he had been on quite a bit 80 mg 1 p.o. in the morning     and half p.o. in the evening on a p.r.n. basis. 10.Glipizide 5 mg 1 tablet p.o. daily. 11.Amlodipine 10 mg one p.o. daily. 12.Aspirin 81 mg 1 p.o. daily. 13.Inspra 25 mg 1 p.o. daily. 14.Avapro 300 mg 1 p.o. daily.  This is on top of the Benicar  40     daily. 15.Cyclobenzaprine 5 mg 1 p.o. b.i.d. 16.Potassium chloride 100 mEq 1 p.o. daily.  ALLERGIES:  NKDA.  PHYSICAL EXAMINATION:  VITAL SIGNS:  Temperature 97.7, pulse 82, respiratory rate 18, blood pressure is 157/100, O2 sat is 100%. Currently the patient is on BiPAP.  He was placed on BiPAP for respiratory distress.  No gas was checked before he was placed on BiPAP. It is removed for physical exam and history. HEENT:  Eyes; pupils are equal, round and reactive to light and accommodation.  External ocular movements bilaterally intact.  Sclerae nonicteric, noninjected.  Mouth, oral mucosa dry.  No lesions.  No sores.  Pharynx clean.  No erythema.  No exudate. NECK:  Negative for JVD.  Negative for thyromegaly.  Negative for lymphadenopathy. HEART:  Heart sounds are distant.  No murmurs, ectopy or gallops.  No lateral PMI.  No thrills. LUNGS: Clear to auscultation bilaterally.  No wheezes.  No rales.  No rhonchi.  Positive for increased work of breathing.  No tactile fremitus. ABDOMEN:  Morbidly obese, soft, really unable to evaluate for organomegaly.  The patient has a positive Murphy sign.  He also has a fair sized ventral hernia in the periumbilical area that is reducible. EXTREMITIES:  Have a 3+ pitting edema bilaterally, somewhat diminished dorsalis pedis and popliteal pulses bilaterally.  No carotid bruits bilaterally. NEUROLOGIC:  Cranial nerves II-XII gross intact.  Motor and sensory intact.  Proprioception intact.  LABORATORY:  EKG demonstrates a normal sinus rhythm.  EKG is relatively unchanged from an EKG performed in May 2011.  Heart rate 75.  Chest x- ray demonstrates moderate cardiomegaly without acute disease.  WBC is 5.0, hemoglobin 11.9, hematocrit 34.7, platelets 237.  Sodium 138, potassium 4.1, chloride 107, CO2 22, BUN 29, creatinine is 3.87, glucose 75, T-bili 0.4, AST 19, ALT 19, total protein 6.7, magnesium 2.1, BNP is 1114, total protein 6.7,  albumin 3.0, calcium is 9.2.  ASSESSMENT: 1. Exacerbation of congestive heart failure, likely secondary to Lasix     being held.  We will restart his Lasix.  He will be CHF, for this     have been placed on him.  His cardiologist Dr. Doylene Canard was     consulted and will see the patient.  We will rule him out for MI     and monitor his electrolytes carefully. 2. Acute exacerbation of renal failure.  I mentioned where the  patient's baseline creatinine is, the emergency room physician told     me number is 3.2, this is baseline creatinine, but I cannot find     that number anywhere in the computer.  The most recent creatinine     that I can find is actually from 2011 which is 2.11.  The patient     has been discussed with Dr.  Etheleen Nicks partner and [facility] will consult on this patient. 3. Back pain.  Concerned for cholecystitis given the patient's Murphy     sign, his increased pain with deep respiration and his account of     having bowel movements and abdominal cramping rapidly after a meal.     We will check an abdominal ultrasound. 4. Hypertension which is uncontrolled.  This is probably due to his     volume overload from stopping his Lasix.  We will restart Lasix and     monitor his blood pressure carefully.  I am somewhat confused by     his outpatient and high hypertensives as he appears to be on 2     ARBs.  I will only continue him on one and the Renal will be     consulting, so hopefully they will give [place] an opinion on this as     well. 5. Acute on chronic renal failure.  Again [facility] has been     consulted. 6. Morbid obesity. 7. Diabetes mellitus type 2.  I have stopped patient's     antihyperglycemic medications and have just continued him on     sliding scale as he states hat he has been having a problem with     low blood sugars in the last week.  I suspect this is due to his     elevated creatinine.  We will monitor this carefully and he will  be     followed with a sensitive level of a sliding scale. 8. Ischemic cardiomyopathy.  The patient will be continued on his home     medications.  He will be ruled out for MI by EKG and  enzyme     criteria and again Cardiology will be following. 9. Chronic pain.  The patient will be given pain control for his new     acute back pain but he will also be continued on his home     medications.  Parameters will be set for lethargy.  I spent 1 hour and 15 months on this critical care admission.          ______________________________ Karie Kirks, DO     AS/MEDQ  D:  11/30/2010  T:  12/01/2010  Job:  [id number]  cc:   Barrie Folk. Berdine Addison, MD Birdie Riddle, M.D. Windy Kalata, M.D.  Electronically Signed by Karie Kirks DO on 12/07/2010 01:48:25 PM

## 2010-12-24 NOTE — Discharge Summary (Signed)
NAME:  AUGUSTO, BUCKO NO.:  [id number]  MEDICAL RECORD NO.:  [id number]  LOCATION:  [id number]                         FACILITY:  [facility]  PHYSICIAN:  Marletta Lor, MDDATE OF BIRTH:  [birth year]  DATE OF ADMISSION:  11/30/2010 DATE OF DISCHARGE:  12/05/2010                              DISCHARGE SUMMARY   FINAL DIAGNOSES:  Systolic heart failure, acute-on-chronic renal failure, ischemic cardiomyopathy, chronic pain syndrome, coronary artery disease status post stenting, hypertension, type 2 diabetes, remote renal cancer status post right nephrectomy, morbid obesity, gout.  DISCHARGE MEDICATIONS: 1. Allopurinol 300 mg daily. 2. Aspirin 81 mg daily. 3. K-Dur 20 mEq daily. 4. Clonidine 0.1 mg b.i.d. 5. Lasix 80 mg b.i.d. 6. Coreg 25 mg 1-1/2 tablet b.i.d. 7. Imdur 20 b.i.d. 8. Hydralazine 50 mg b.i.d.  HISTORY OF PRESENT ILLNESS:  The patient is a 50 year old male who presented to the emergency department with a several-day history of weight gain, worsening peripheral edema, and worsening shortness of breath.  On the day of admission he awoke with severe shortness of breath and presented to the ED for evaluation.  He also complained of increasing back pain but denied any fever, cough or sputum production. He denied any chest pain.  He stated that he had discontinued Lasix several days prior to admission.  The patient was subsequently admitted for evaluation and treatment of exacerbation of his chronic systolic heart failure.  LABORATORY DATA AND HOSPITAL COURSE:  The patient was admitted to telemetry setting initially and carefully diuresed for his heart failure.  He was initially treated with IV Lasix and gradually transitioned to oral Lasix and at the time of discharge was on 80 mg twice daily.  His peripheral edema resolved nicely.  He was seen in consultation by Nephrology for his acute exacerbation of renal failure. During the hospital period, his  back pain remained stable and he was managed with hydrocodone.  On admission, his blood pressure was poorly controlled in part due to volume overload.  This improved with managing of his medications and control of his excess volume.  The patient's diabetes remained well controlled and oral sulfonylurea therapy was discontinued.  His blood sugars remained stable off therapy.  He was covered with a sliding scale regimen of short-acting insulin.  During the hospital period, the patient was also seen in consultation by Cardiology.  LABORATORY STUDIES:  A chest x-ray on admission that revealed moderate cardiomegaly but no acute disease.  An abdominal ultrasound was obtained due to the back pain and some concern for cholecystitis.  This revealed his right nephrectomy but no other acute findings.  A pulmonary ventilation perfusion scan was obtained that was low probability for acute pulmonary emboli.  Laboratory studies included a CBC on admission that revealed a white count of 5, hemoglobin 11.9, hematocrit 34.7.  BNP on admission was 1114 and prior to his discharge this had improved to 526 during the hospital.  Serial electrolytes were monitored closely. On admission, BUN was 29 with a creatinine of 3.87.  Renal indices on the day of discharge revealed a BUN of 56 and a creatinine of 4.97, magnesium level was 2.1, phosphorus slightly elevated at  5.7, hemoglobin A1c was normal at 5.5.  Cardiac markers revealed no evidence of infarction.  DISPOSITION:  The patient will be discharged today and has been instructed to follow up with his primary care provider within the next 2 or 3 days.  He will need followup renal function studies.  He was given a detailed list of his medications and new prescriptions.  Restricted salt diet and aforesaid ongoing weight loss all encouraged.  CONDITION ON DISCHARGE:  Improved.     Marletta Lor, MD     PFK/MEDQ  D:  12/05/2010  T:  12/05/2010   Job:  [id number]  Electronically Signed by Bluford Kaufmann MD on 12/24/2010 12:58:34 PM

## 2011-01-18 ENCOUNTER — Inpatient Hospital Stay (HOSPITAL_COMMUNITY)
Admission: EM | Admit: 2011-01-18 | Discharge: 2011-01-20 | DRG: 533 | Disposition: A | Discharge status: Home / Self Care | Payer: BC Managed Care – PPO | Source: Ambulatory Visit | Attending: Internal Medicine | Admitting: Internal Medicine

## 2011-01-18 ENCOUNTER — Emergency Department (HOSPITAL_COMMUNITY): Payer: BC Managed Care – PPO

## 2011-01-18 DIAGNOSIS — Z85528 Personal history of other malignant neoplasm of kidney: Secondary | ICD-10-CM

## 2011-01-18 DIAGNOSIS — I129 Hypertensive chronic kidney disease with stage 1 through stage 4 chronic kidney disease, or unspecified chronic kidney disease: Secondary | ICD-10-CM | POA: Diagnosis present

## 2011-01-18 DIAGNOSIS — I5042 Chronic combined systolic (congestive) and diastolic (congestive) heart failure: Secondary | ICD-10-CM | POA: Diagnosis present

## 2011-01-18 DIAGNOSIS — I509 Heart failure, unspecified: Secondary | ICD-10-CM | POA: Diagnosis present

## 2011-01-18 DIAGNOSIS — R209 Unspecified disturbances of skin sensation: Secondary | ICD-10-CM | POA: Diagnosis present

## 2011-01-18 DIAGNOSIS — Z7982 Long term (current) use of aspirin: Secondary | ICD-10-CM

## 2011-01-18 DIAGNOSIS — I2589 Other forms of chronic ischemic heart disease: Secondary | ICD-10-CM | POA: Diagnosis present

## 2011-01-18 DIAGNOSIS — G894 Chronic pain syndrome: Secondary | ICD-10-CM | POA: Diagnosis present

## 2011-01-18 DIAGNOSIS — R5383 Other fatigue: Secondary | ICD-10-CM | POA: Diagnosis present

## 2011-01-18 DIAGNOSIS — M109 Gout, unspecified: Secondary | ICD-10-CM | POA: Diagnosis present

## 2011-01-18 DIAGNOSIS — I6350 Cerebral infarction due to unspecified occlusion or stenosis of unspecified cerebral artery: Principal | ICD-10-CM | POA: Diagnosis present

## 2011-01-18 DIAGNOSIS — N184 Chronic kidney disease, stage 4 (severe): Secondary | ICD-10-CM | POA: Diagnosis present

## 2011-01-18 DIAGNOSIS — E119 Type 2 diabetes mellitus without complications: Secondary | ICD-10-CM | POA: Diagnosis present

## 2011-01-18 DIAGNOSIS — R5381 Other malaise: Secondary | ICD-10-CM | POA: Diagnosis present

## 2011-01-18 DIAGNOSIS — I428 Other cardiomyopathies: Secondary | ICD-10-CM | POA: Diagnosis present

## 2011-01-18 LAB — COMPREHENSIVE METABOLIC PANEL
ALT: 15 U/L (ref 0–53)
AST: 14 U/L (ref 0–37)
Albumin: 3.2 g/dL — ABNORMAL LOW (ref 3.5–5.2)
Alkaline Phosphatase: 85 U/L (ref 39–117)
BUN: 45 mg/dL — ABNORMAL HIGH (ref 6–23)
CO2: 26 mEq/L (ref 19–32)
Calcium: 9.4 mg/dL (ref 8.4–10.5)
Chloride: 104 mEq/L (ref 96–112)
Creatinine, Ser: 3.94 mg/dL — ABNORMAL HIGH (ref 0.50–1.35)
GFR calc Af Amer: 20 mL/min — ABNORMAL LOW (ref >60)
GFR calc non Af Amer: 16 mL/min — ABNORMAL LOW (ref >60)
Glucose, Bld: 92 mg/dL (ref 70–99)
Potassium: 3.8 mEq/L (ref 3.5–5.1)
Sodium: 138 mEq/L (ref 135–145)
Total Bilirubin: 0.2 mg/dL — ABNORMAL LOW (ref 0.3–1.2)
Total Protein: 6.7 g/dL (ref 6.0–8.3)

## 2011-01-18 LAB — DIFFERENTIAL
Basophils Absolute: 0 10*3/uL (ref 0.0–0.1)
Basophils Relative: 0 % (ref 0–1)
Eosinophils Absolute: 0.2 10*3/uL (ref 0.0–0.7)
Eosinophils Relative: 4 % (ref 0–5)
Lymphocytes Relative: 26 % (ref 12–46)
Lymphs Abs: 1.1 10*3/uL (ref 0.7–4.0)
Monocytes Absolute: 0.5 10*3/uL (ref 0.1–1.0)
Monocytes Relative: 12 % (ref 3–12)
Neutro Abs: 2.4 10*3/uL (ref 1.7–7.7)
Neutrophils Relative %: 58 % (ref 43–77)

## 2011-01-18 LAB — URINALYSIS, ROUTINE W REFLEX MICROSCOPIC
Bilirubin Urine: NEGATIVE
Glucose, UA: NEGATIVE mg/dL
Hgb urine dipstick: NEGATIVE
Ketones, ur: NEGATIVE mg/dL
Leukocytes, UA: NEGATIVE
Nitrite: NEGATIVE
Protein, ur: >300 mg/dL — AB
Specific Gravity, Urine: 1.012 (ref 1.005–1.030)
Urobilinogen, UA: 0.2 mg/dL (ref 0.0–1.0)
pH: 6 (ref 5.0–8.0)

## 2011-01-18 LAB — CBC
HCT: 32.5 % — ABNORMAL LOW (ref 39.0–52.0)
Hemoglobin: 11 g/dL — ABNORMAL LOW (ref 13.0–17.0)
MCH: 28.9 pg (ref 26.0–34.0)
MCHC: 33.8 g/dL (ref 30.0–36.0)
MCV: 85.3 fL (ref 78.0–100.0)
Platelets: 202 10*3/uL (ref 150–400)
RBC: 3.81 MIL/uL — ABNORMAL LOW (ref 4.22–5.81)
RDW: 13.8 % (ref 11.5–15.5)
WBC: 4.1 10*3/uL (ref 4.0–10.5)

## 2011-01-18 LAB — URINE MICROSCOPIC-ADD ON

## 2011-01-18 LAB — GLUCOSE, CAPILLARY
Glucose-Capillary: 109 mg/dL — ABNORMAL HIGH (ref 70–99)
Glucose-Capillary: 98 mg/dL (ref 70–99)

## 2011-01-18 LAB — POCT I-STAT TROPONIN I: Troponin i, poc: 0.05 ng/mL (ref 0.00–0.08)

## 2011-01-18 LAB — CK TOTAL AND CKMB (NOT AT ARMC)
CK, MB: 3.5 ng/mL (ref 0.3–4.0)
Relative Index: 1.6 (ref 0.0–2.5)
Total CK: 217 U/L (ref 7–232)

## 2011-01-18 LAB — HEMOGLOBIN A1C
Hgb A1c MFr Bld: 5.6 % (ref <5.7)
Mean Plasma Glucose: 114 mg/dL (ref <117)

## 2011-01-18 LAB — PROTIME-INR
INR: 0.91 (ref 0.00–1.49)
Prothrombin Time: 12.5 seconds (ref 11.6–15.2)

## 2011-01-18 LAB — APTT: aPTT: 27 seconds (ref 24–37)

## 2011-01-18 LAB — PRO B NATRIURETIC PEPTIDE: Pro B Natriuretic peptide (BNP): 719.3 pg/mL — ABNORMAL HIGH (ref 0–125)

## 2011-01-19 DIAGNOSIS — H3400 Transient retinal artery occlusion, unspecified eye: Secondary | ICD-10-CM

## 2011-01-19 DIAGNOSIS — R209 Unspecified disturbances of skin sensation: Secondary | ICD-10-CM

## 2011-01-19 LAB — LIPID PANEL
Cholesterol: 127 mg/dL (ref 0–200)
HDL: 46 mg/dL (ref >39)
LDL Cholesterol: 56 mg/dL (ref 0–99)
Total CHOL/HDL Ratio: 2.8 RATIO
Triglycerides: 126 mg/dL (ref <150)
VLDL: 25 mg/dL (ref 0–40)

## 2011-01-19 LAB — BASIC METABOLIC PANEL
BUN: 44 mg/dL — ABNORMAL HIGH (ref 6–23)
CO2: 27 mEq/L (ref 19–32)
Calcium: 9.1 mg/dL (ref 8.4–10.5)
Chloride: 101 mEq/L (ref 96–112)
Creatinine, Ser: 3.81 mg/dL — ABNORMAL HIGH (ref 0.50–1.35)
GFR calc Af Amer: 21 mL/min — ABNORMAL LOW (ref >60)
GFR calc non Af Amer: 17 mL/min — ABNORMAL LOW (ref >60)
Glucose, Bld: 100 mg/dL — ABNORMAL HIGH (ref 70–99)
Potassium: 3.9 mEq/L (ref 3.5–5.1)
Sodium: 135 mEq/L (ref 135–145)

## 2011-01-19 LAB — URINE CULTURE
Colony Count: NO GROWTH
Culture  Setup Time: 201208271007
Culture: NO GROWTH

## 2011-01-19 LAB — CBC
HCT: 31.6 % — ABNORMAL LOW (ref 39.0–52.0)
Hemoglobin: 10.6 g/dL — ABNORMAL LOW (ref 13.0–17.0)
MCH: 28.6 pg (ref 26.0–34.0)
MCHC: 33.5 g/dL (ref 30.0–36.0)
MCV: 85.2 fL (ref 78.0–100.0)
Platelets: 198 10*3/uL (ref 150–400)
RBC: 3.71 MIL/uL — ABNORMAL LOW (ref 4.22–5.81)
RDW: 13.9 % (ref 11.5–15.5)
WBC: 4.3 10*3/uL (ref 4.0–10.5)

## 2011-01-19 LAB — PRO B NATRIURETIC PEPTIDE: Pro B Natriuretic peptide (BNP): 1050 pg/mL — ABNORMAL HIGH (ref 0–125)

## 2011-01-27 NOTE — Discharge Summary (Signed)
[name], [name] NO.:  [id number]  MEDICAL RECORD NO.:  [id number]  LOCATION:  3007                         FACILITY:  [facility]  PHYSICIAN:  Sharlet Salina, M.D.   DATE OF BIRTH:  06/30/1960  DATE OF ADMISSION:  01/18/2011 DATE OF DISCHARGE:  01/20/2011                              DISCHARGE SUMMARY   DISCHARGE DIAGNOSES: 1. Acute/subacute punctate infarction involving the inferior left     posterior midbrain. 2. Remote lacunar infarct of the right mid brain and remote lacunar     infarct of the left thalamus and posterior putamen with right-sided     weakness that is now resolved. 3. Chronic combined congestive heart failure, left ventricular     ejection fraction on 2-D echo done on December 01, 2010, showed left     ventricular ejection fraction of 25% to 30%. 4. Ischemic cardiomyopathy. 5. Chronic pain syndrome. 6. Coronary artery disease status post percutaneous transluminal     coronary angioplasty. 7. Hypertension. 8. Diabetes. 9. Remote history of renal cell cancer. 10.Obesity. 11.Status post right nephrectomy with solitary left kidney. 12.Gouty arthritis.  DISCHARGE MEDICATIONS: 1. Aspirin 325 mg daily. 2. Allopurinol 300 mg daily. 3. Colchicine 0.6 mg twice daily. 4. Coreg 25 mg twice daily. 5. Lasix 80 mg three times daily. 6. Hydralazine 50 mg twice daily. 7. Isosorbide dinitrate 20 mg twice daily. 8. Oxycodone/acetaminophen 5/325 twice daily as needed. 9. Simvastatin 20 mg daily.  FOLLOWUP APPOINTMENTS:  The patient to follow up with Dr. Doylene Canard in 2 weeks and with Dr. Berdine Addison in 1 week.  PROCEDURES:  MRI of the brain showed acute/subacute punctate infarct involving the inferior left posterior midbrain.  MRA, extensive small vessel disease, the more proximal disease suggestion on a prior study is not evident on today's exam.  Small right vertebral artery with known non-vascularization of the right PICA.  Some progression of  the atherosclerotic disease.  CT of the head, no acute intracranial abnormality, possible small scattered areas of chronic microvascular white matter ischemic changes.  Chest x-ray, no acute infiltrate or edema, chronic cardiomegaly.  Carotid Dopplers negative.  CONSULTANTS:  Neurology phone consultation and Dr. Doylene Canard regular consultation.  HISTORY OF PRESENT ILLNESS:  The patient is a 50 year old male with past medical history significant for chronic congestive heart failure, chronic kidney disease stage IV, and morbid obesity.  The patient came into the hospital with right-sided weakness.  Episode of weakness started yesterday about 10 p.m.  He was watching TV and he dropped his remote control after he noticed that his arm is weak as well as his right leg.  The patient could not stand up because of that weakness. The patient also had decreased sensation and tingling sensation of both right arm and leg.  Please see admission note for remainder of history, past medical history, family history, social history, meds, allergies, review of systems per admission H and H and P.  PHYSICAL EXAMINATION AT THE TIME OF DISCHARGE:  VITAL SIGNS: Temperature 97.6, pulse 68, respirations 17, blood pressure 150/89, pulse ox 97% on room air. GENERAL:  The patient is sitting up in chair, well-nourished African American male. HEENT:  Normocephalic, atraumatic.  Pupils reactive to light.  Throat without erythema. CARDIOVASCULAR:  Regular rate and rhythm. LUNGS:  Clear bilaterally. ABDOMEN:  Positive bowel sounds. EXTREMITIES:  With 1 to 2+ edema bilaterally.  HOSPITAL COURSE: 1. Acute CVA.  The patient was admitted to the hospital, had the     stroke workup with phone consultation by Dr. Hartford Poli to Neurology.     He had PT/OT and speech therapy done, which all said that he did     not need any further management.  His aspirin 81 mg was changed to     325.  Because of the patient's congestive  heart failure, he was     recommended to be on Coumadin, but the patient does not want to be     on Coumadin.  He will follow up in 2 weeks in the office with Dr.     Doylene Canard, his cardiologist. 2. Hypertension.  He will continue all his regular medications. 3. Chronic kidney disease that is stable and he will follow up with     Dr. Mercy Moore. 4. Gout.  He was continued on allopurinol and colchicine. 5. Congestive heart failure.  CHF is stable.  He will continue with     his Lasix as prior to admission.  DISCHARGE LABS:  Urine culture, no growth.  Pro-BNP 1050.  Total cholesterol 127, HDL 46, LDL 56, triglycerides 126.  BMP; sodium 135, potassium 3.9, chloride 101, CO2 of 27, glucose 100, BUN 44, creatinine 3.81, calcium 9.1.  Hemoglobin A1c 5.6.  WBC 4.3, hemoglobin 10.6, MCV 85.2, platelet of 199.  Time spent with the patient and doing this discharge is approximately 45 minutes.     Sharlet Salina, M.D.     N[place]/MEDQ  D:  01/20/2011  T:  01/20/2011  Job:  [id number]  cc:   Birdie Riddle, M.D. Windy Kalata, M.D. Dr. Berdine Addison  Electronically Signed by Sharlet Salina M.D. on 01/27/2011 10:28:14 PM

## 2011-02-01 ENCOUNTER — Other Ambulatory Visit: Payer: Self-pay

## 2011-02-01 DIAGNOSIS — N186 End stage renal disease: Secondary | ICD-10-CM

## 2011-02-01 DIAGNOSIS — Z01810 Encounter for preprocedural cardiovascular examination: Secondary | ICD-10-CM

## 2011-02-22 DIAGNOSIS — I639 Cerebral infarction, unspecified: Secondary | ICD-10-CM

## 2011-02-22 HISTORY — DX: Cerebral infarction, unspecified: I63.9

## 2011-02-23 ENCOUNTER — Encounter: Payer: Self-pay | Admitting: Vascular Surgery

## 2011-02-25 ENCOUNTER — Encounter: Payer: Self-pay | Admitting: Vascular Surgery

## 2011-02-26 ENCOUNTER — Encounter: Payer: Self-pay | Admitting: Vascular Surgery

## 2011-02-26 ENCOUNTER — Ambulatory Visit (INDEPENDENT_AMBULATORY_CARE_PROVIDER_SITE_OTHER): Payer: BC Managed Care – PPO | Admitting: Vascular Surgery

## 2011-02-26 ENCOUNTER — Other Ambulatory Visit (INDEPENDENT_AMBULATORY_CARE_PROVIDER_SITE_OTHER): Payer: BC Managed Care – PPO | Admitting: *Deleted

## 2011-02-26 VITALS — BP 129/88 | HR 70 | Resp 20 | Ht 73.0 in | Wt 291.0 lb

## 2011-02-26 DIAGNOSIS — N184 Chronic kidney disease, stage 4 (severe): Secondary | ICD-10-CM

## 2011-02-26 NOTE — Progress Notes (Signed)
[facility]  Referred by:  Windy Kalata, MD [address] [place], [place] [address]  Reason for referral: New access  History of Present Illness  [name] is a 50 y.o. male who presents for evaluation for permanent access.  The patient is right hand dominant.  The patient has not had previous access procedures.  Previous central venous cannulation procedures include: none.  The patient has never had a PPM placed.  Reason for CKD: unilateral nephrectomy for RCC and HTN and DM  Past Medical History  Diagnosis Date  . CHF (congestive heart failure)   . Diabetes mellitus   . Cardiomyopathy   . Renal cell carcinoma   . History of unilateral nephrectomy   . Hypertension   . Cardiomyopathy   . Gout     Past Surgical History  Procedure Date  . Nephrectomy     History   Social History  . Marital Status: Married    Spouse Name: N/A    Number of Children: N/A  . Years of Education: N/A   Occupational History  . Not on file.   Social History Main Topics  . Smoking status: Never Smoker   . Smokeless tobacco: Never Used  . Alcohol Use: No  . Drug Use: No  . Sexually Active: Not on file   Other Topics Concern  . Not on file   Social History Narrative  . No narrative on file    Family History  Problem Relation Age of Onset  . Hypertension Mother   . Kidney disease Mother     Current Outpatient Prescriptions on File Prior to Visit  Medication Sig Dispense Refill  . allopurinol (ZYLOPRIM) 300 MG tablet Take 300 mg by mouth daily.        Marland Kitchen aspirin EC 81 MG tablet Take 81 mg by mouth daily.        . calcitRIOL (ROCALTROL) 0.25 MCG capsule Take 0.25 mcg by mouth daily.        . carvedilol (COREG) 25 MG tablet Take 25 mg by mouth 2 (two) times daily with a meal.        . cloNIDine (CATAPRES) 0.2 MG tablet Take 0.2 mg by mouth 3 (three) times daily.        . colchicine 0.6 MG tablet Take 0.6 mg by mouth 2 (two) times daily.        .  furosemide (LASIX) 80 MG tablet Take 80 mg by mouth 2 (two) times daily. 80mg  in the morning. 40mg  in the evening.       . hydrALAZINE (APRESOLINE) 50 MG tablet Take 50 mg by mouth 2 (two) times daily.        . isosorbide dinitrate (ISORDIL) 20 MG tablet Take 20 mg by mouth 2 (two) times daily.        . nitroGLYCERIN (NITRODUR - DOSED IN MG/24 HR) 0.4 mg/hr Place 1 patch onto the skin daily.        Marland Kitchen oxyCODONE-acetaminophen (PERCOCET) 5-325 MG per tablet Take 1 tablet by mouth 2 (two) times daily as needed.        . simvastatin (ZOCOR) 20 MG tablet Take 20 mg by mouth at bedtime.          Allergies as of 02/26/2011 - Review Complete 02/26/2011  Allergen Reaction Noted  . Ace inhibitors  02/23/2011    Review of Systems (Positive items in bold and italic, otherwise negative)  General: Weight loss, Weight gain, Loss  of appetite, Fever  Neurologic: Dizziness, Blackouts, Headaches, Seizure  Ear/Nose/Throat: Change in eyesight, Change in hearing, Nose bleeds, Sore throat  Vascular: Pain in legs with walking, Pain in feet while lying flat, Non-healing ulcer, Stroke, "Mini stroke", Slurred speech, Temporary blindness, Blood clot in vein, Phlebitis  Pulmonary: Home oxygen, Productive cough, Bronchitis, Coughing up blood, Asthma, Wheezing  Musculoskeletal: Arthritis, Joint pain, Muscle pain  Cardiac: Chest pain, Chest tightness/pressure, Shortness of breath when lying flat, Shortness of breath with exertion, Palpitations, Heart murmur, Arrythmia, Atrial fibrillation  Hematologic: Bleeding problems, Clotting disorder, Anemia  Psychiatric:  Depression, Anxiety, Attention deficit disorder  Gastrointestinal:  Black stool, Blood in stool, Peptic ulcer disease, Reflux, Hiatal hernia, Trouble swallowing, Diarrhea, Constipation  Urinary:  Kidney disease, Burning with urination, Frequent urination, Difficulty urinating  Skin: Ulcers, Rashes  Physical Examination  Filed Vitals:   02/26/11 1538    BP: 129/88  Pulse: 70  Resp: 20  Height: 6\' 1"  (1.854 m)  Weight: 291 lb (131.997 kg)    General: A&O x 3, WDWN, obese  Head: [place]/AT  Ear/Nose/Throat: Hearing grossly intact, nares w/o erythema or drainage, oropharynx w/o Erythema/Exudate  Eyes: PERRLA, EOMI  Neck: Supple, no nuchal rigidity, no palpable LAD  Pulmonary: Sym exp, good air movt, CTAB, no rales, rhonchi, & wheezing  Cardiac: RRR, Nl S1, S2, no Murmurs, rubs or gallops  Vascular: Vessel Right Left  Radial Palpable Palpable  Brachial Palpable Palpable  Carotid Palpable, without bruit Palpable, without bruit  Aorta Non-palpable N/A  Femoral Palpable Palpable  Popliteal Non-palpable Non-palpable  PT Palpable Palpable  DP Palpable Palpable   Gastrointestinal: soft, NTND, -G/R, - HSM, - masses, - CVAT B, mildly obese  Musculoskeletal: M/S 5/5 throughout , Extremities without ischemic changes  Neurologic: CN 2-12 intact , Pain and light touch intact in extremities , Motor exam as listed above  Psychiatric: Judgment intact, Mood & affect appropriate for pt's clinical situation  Dermatologic: See M/S exam for extremity exam, no rashes otherwise noted  Lymph : No Cervical, Axillary, or Inguinal lymphadenopathy   Non-Invasive Vascular Imaging  Vein Mapping  (Date: 02/26/11):   R arm: acceptable vein conduits include entire cephalic and basic  L arm: acceptable vein conduits include entire cephalic and basic  Outside Studies/Documentation 5 pages of outside documents were reviewed including: nephrology clinic notes.  Medical Decision Making  [name] is a 50 y.o. male who presents with CKD Stage IV requiring preemptive  AVF placement.   Based on vein mapping and examination, this patient's permanent access options include: L RC vs BC AVF, R RC vs BC AVF, L BVT, R BVT  I had an extensive discussion with this patient in regards to the nature of access surgery, including risk, benefits, and  alternatives.    The patient is aware that the risks of access surgery include but are not limited to: bleeding, infection, steal syndrome, nerve damage, ischemic monomelic neuropathy, failure of access to mature, and possible need for additional access procedures in the future.  The patient has agreed to proceed with the above procedure which will be scheduled at a date of the patient's convenience in the next two weeks.  Adele Barthel, MD [facility] Office: [phone] Pager: [phone]

## 2011-03-02 NOTE — Procedures (Unsigned)
CEPHALIC VEIN MAPPING  INDICATION:  Preoperative vein mapping for AVF placement.  HISTORY: Chronic kidney disease, stage IV.  EXAM: The right cephalic vein is compressible.  Diameter measurements range from 0.58 to 0.35 cm.  The right basilic vein is compressible.  Diameter measurements range from 0.51 to 0.47 cm.  The left cephalic vein is compressible.  Diameter measurements range from 0.43 to 0.34 cm.  The left basilic vein is compressible.  Diameter measurements range from 0.30 to 0.37 cm.  See attached worksheet for all measurements.  IMPRESSION:  Patent bilateral cephalic and basilic veins with diameter measurements shown on the following worksheet.  ___________________________________________ Conrad , MD  EM/MEDQ  D:  02/26/2011  T:  02/26/2011  Job:  [id number]

## 2011-03-08 NOTE — H&P (Signed)
NAME:  ROXANNE, DYCK NO.:  [id number]  MEDICAL RECORD NO.:  [id number]  LOCATION:  [facility]                         FACILITY:  [facility]  PHYSICIAN:  Verlee Monte, MD       DATE OF BIRTH:  12-02-1960  DATE OF ADMISSION:  01/18/2011 DATE OF DISCHARGE:                             HISTORY & PHYSICAL   PRIMARY CARE PHYSICIAN:  Barrie Folk. Hill, MD  REASON FOR ADMISSION:  Right-sided weakness.  BRIEF HISTORY AND EXAMINATION:  Mr. Rickert is a 50 year old male with past medical history of chronic compliant congestive heart failure, chronic kidney disease stage IV, and morbid obesity.  The patient came into the hospital because of right-sided weakness.  Episode of weakness started yesterday about 10 p.m. when he was watching TV and he dropped his remote control.  Afterwards, he noticed that his arm is weak as well as his right leg.  The patient could not stand up because of that weakness.  The patient also has decreased sensation and tingling sensation of both right arm and leg.  The patient did not notice any change in his speech pattern.  No family member told him he has any type of facial droop.  The patient will be admitted to the hospital for further evaluation.  PAST MEDICAL HISTORY: 1. Chronic combined congestive heart failure. 2. Chronic kidney disease, stage IV. 3. Ischemic cardiomyopathy. 4. Chronic pain syndrome. 5. Coronary artery disease status post PTCA. 6. Hypertension. 7. Type 2 diabetes. 8. Remote renal cell cancer. 9. Obesity. 10.Status post right nephrectomy with solitary left kidney. 11.Gouty arthritis.  HOME MEDICATIONS: 1. Hydralazine 50 mg p.o. b.i.d. 2. Imdur 20 mg p.o. b.i.d. 3. Coreg 25 mg half tablet p.o. b.i.d. 4. Clonidine 0.1 mg p.o. b.i.d. 5. Simvastatin 20 mg p.o. daily. 6. Oxycodone 5/725 mg p.o. b.i.d. as needed for pain or gout. 7. Furosemide 80 mg three times a day. 8. Colchicine 0.6 mg p.o. daily as needed for gout. 9.  Aspirin enteric-coated 81 mg p.o. daily. 10.Allopurinol 300 mg p.o. daily.  FAMILY HISTORY:  Positive for hypertension in his mother.  SOCIAL HISTORY:  The patient lives with wife.  Has two grown-up kids. The patient does not smoke or drink.  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  REVIEW OF SYSTEMS:  GENERAL:  The patient denies fever or chills.  CNS: The patient has right-sided weakness and tingling.  ENT:  Nasal congestion.  No throat pain or coryza.  CARDIOVASCULAR:  Denies chest pain.  Denies palpitations.  Has chronic orthopnea.  RESPIRATORY: Denies shortness of breath.  Denies cough.  Denies wheezing.  GI: Denies nausea, vomiting, abdominal pain.  GU:  Denies dysuria, hematuria, or CVA tenderness.  Lower extremity has lower extremity edema chronic.  SKIN:  Denies rash.  Denies sores.  HEMATOLOGY:  Denies easy bruising or recent blood transfusion.  LYMPH:  Denies lymphadenopathy. PSYCHIATRY:  Denies anxiety, depression.  ALLERGIES:  NKDA.  PHYSICAL EXAMINATION:  VITALS:  Temperature 98.7, respirations 18, pulse is 65, blood pressure is 141/89, O2 sats 99% on room air. GENERAL:  The patient is well-developed, well-nourished African American male with obesity  in no acute distress. HEAD AND FACE:  Normocephalic, atraumatic. EYES:  Pupils were equal, round, and reactive to light and accommodation. ENT:  Ears, nose, and throat normal.  Mouth without oral thrush or lesions. NECK:  Supple.  No masses. CARDIOVASCULAR:  Regular rate and rhythm.  No murmurs, rubs, or gallops. RESPIRATORY:  Breath sounds clear and equal bilaterally.  Chest nontender or distended. ABDOMEN:  Bowel sounds heard.  Soft, nontender, nondistended. EXTREMITIES:  No deformity.  There is +2 pitting pedal edema. NEURO:  Alert and oriented x3.  Cranial nerves II through XII grossly intact.  Motor intact in all extremities except the right upper and lower extremity with some  weakness.  The power in the right upper extremity is above 4 and the right lower extremity is above 3.  The patient is complaining about tingling and decreased sensation.  The patient is able to tow his leg like with towing.  RADIOLOGY: 1. CT scan of the head showed no acute intracranial abnormalities.     There are possible small scattered areas of chronic microvascular     white matter ischemic changes as before. 2. Chest x-ray, two views no acute inflammatory infiltrates or edema,     bibasilar atelectasis, chronic cardiomegaly.  LABORATORY DATA: 1. CBC; WBC is 4.1, hemoglobin 11.0, hematocrit 32.5, platelets 202. 2. BMP; sodium 138, potassium 3.8, chloride 104, bicarb is 26, glucose     92, BUN is 45, creatinine 3.9. 3. Pro-BNP 719.  UA showed proteinuria. 4. Cardiac enzymes, troponin less than 0.05.  ASSESSMENT/PLAN: 1. Right upper and lower extremity weakness.  The patient will be     admitted to out to rule out a stroke, although it is atypical but     MRI of the head will be obtained as well as MRA.  2-D     echocardiogram, carotid Dopplers, EKG, and the patient will be on     telemetry bed to rule out any arrhythmias. 2. The patient on aspirin 81 mg, we will switch to 325 mg depending on     the MRI findings.  We will continue on that and we will put him     back on the low-dose aspirin. 3. Chronic combined congestive heart failure.  The patient has     systolic and diastolic heart failure, which is chronic.  Although     the patient has lower extremity edema, but he does not have     symptoms or signs of acute decompensation.  The patient does not     have any shortness of breath.  His chest x-ray is clears and his     BNP is slightly elevated at 719.  We will continue his home dose of     Lasix and check his renal function in the morning. 4. Chronic kidney disease on Lasix.  The patient has solitary kidney     secondary to removal of the right kidney.  We will continue  his     Lasix and check his renal function in the morning. 5. Hypertension.  Continue blood pressure medication.     Verlee Monte, MD     ME/MEDQ  D:  01/18/2011  T:  01/18/2011  Job:  [id number]  cc:   Barrie Folk. Berdine Addison, MD  Electronically Signed by Verlee Monte  on 03/08/2011 01:06:56 PM

## 2011-03-26 ENCOUNTER — Encounter (HOSPITAL_COMMUNITY): Payer: Self-pay | Admitting: Pharmacy Technician

## 2011-03-26 ENCOUNTER — Encounter (HOSPITAL_COMMUNITY): Payer: Self-pay | Admitting: *Deleted

## 2011-03-28 MED ORDER — CHLORHEXIDINE GLUCONATE CLOTH 2 % EX PADS: 6.0000 | MEDICATED_PAD | Freq: Every day | CUTANEOUS | Status: DC

## 2011-03-28 MED ORDER — CEFAZOLIN SODIUM-DEXTROSE 2-3 GM-% IV SOLR
2.0000 g | Freq: Once | INTRAVENOUS | Status: DC
  Filled 2011-03-28 (×2): qty 50

## 2011-03-28 NOTE — H&P (Signed)
[facility] [place]  Brief Access History and Physical  History of Present Illness  [name] is a 50 y.o. male who presents with chief complaint: imminent ESRD due to [place].  The patient presents today for placement of L RC vs BC AVF.   He is not currently on HD  Past Medical History  Diagnosis Date  . CHF (congestive heart failure)   . Diabetes mellitus   . Cardiomyopathy   . History of unilateral nephrectomy   . Hypertension   . Cardiomyopathy   . Gout   . Stroke 10/12  . Renal cell carcinoma     Past Surgical History  Procedure Date  . Nephrectomy     History   Social History  . Marital Status: Married    Spouse Name: N/A    Number of Children: N/A  . Years of Education: N/A   Occupational History  . Not on file.   Social History Main Topics  . Smoking status: Never Smoker   . Smokeless tobacco: Never Used  . Alcohol Use: No  . Drug Use: No  . Sexually Active: Not on file   Other Topics Concern  . Not on file   Social History Narrative  . No narrative on file    Family History  Problem Relation Age of Onset  . Hypertension Mother   . Kidney disease Mother     No current facility-administered medications on file prior to encounter.   Current Outpatient Prescriptions on File Prior to Encounter  Medication Sig Dispense Refill  . allopurinol (ZYLOPRIM) 300 MG tablet Take 300 mg by mouth daily.        . carvedilol (COREG) 25 MG tablet Take 25 mg by mouth 2 (two) times daily.       . cloNIDine (CATAPRES) 0.2 MG tablet Take 0.2 mg by mouth 3 (three) times daily.        . furosemide (LASIX) 80 MG tablet Take 80 mg by mouth 2 (two) times daily.       . hydrALAZINE (APRESOLINE) 50 MG tablet Take 50 mg by mouth 2 (two) times daily.        . isosorbide dinitrate (ISORDIL) 20 MG tablet Take 20 mg by mouth 2 (two) times daily.        Marland Kitchen oxyCODONE-acetaminophen (PERCOCET) 5-325 MG per tablet Take 1 tablet by mouth daily as needed. For  pain        Allergies  Allergen Reactions  . Ace Inhibitors Cough    Review of Systems: As listed above, otherwise negative.  Physical Examination  Filed Vitals:   03/29/11 0704  BP: 129/85  Pulse: 68  Temp: 98 F (36.7 C)  TempSrc: Oral  Resp: 18  Height: 6\' 1"  (1.854 m)  Weight: 291 lb (131.997 kg)  SpO2: 98%   Body mass index is 38.39 kg/(m^2).  General: A&O x 3, WDWN  Pulmonary: Sym exp, good air movt, CTAB, no rales, rhonchi, & wheezing  Cardiac: RRR, Nl S1, S2, no Murmurs, rubs or gallops  Gastrointestinal: soft, NTND, -G/R, - HSM, - masses, - CVAT B  Musculoskeletal: M/S 5/5 throughout , Extremities without ischemic changes   Medical Decision Making  [name] is a 50 y.o. male who presents with: imminent end stage renal disease.   The patient is scheduled for: L RC vs BC AVF.  Risk, benefits, and alternatives to access surgery were discussed.  The patient is aware the risks include but are  not limited to: bleeding, infection, steal syndrome, nerve damage, ischemic monomelic neuropathy, failure to mature, and need for additional procedures.  The patient has agreed to proceed forward with the procedure.  Adele Barthel, MD [facility] [place] Office: [phone] Pager: [phone]

## 2011-03-29 ENCOUNTER — Ambulatory Visit (HOSPITAL_COMMUNITY)
Admission: RE | Admit: 2011-03-29 | Discharge: 2011-03-29 | Disposition: A | Discharge status: Home / Self Care | Payer: BC Managed Care – PPO | Source: Ambulatory Visit | Attending: Vascular Surgery | Admitting: Vascular Surgery

## 2011-03-29 ENCOUNTER — Encounter (HOSPITAL_COMMUNITY)
Admission: RE | Disposition: A | Discharge status: Home / Self Care | Payer: Self-pay | Source: Ambulatory Visit | Attending: Vascular Surgery

## 2011-03-29 ENCOUNTER — Ambulatory Visit (HOSPITAL_COMMUNITY): Payer: BC Managed Care – PPO | Admitting: Anesthesiology

## 2011-03-29 ENCOUNTER — Encounter (HOSPITAL_COMMUNITY): Payer: Self-pay | Admitting: Anesthesiology

## 2011-03-29 ENCOUNTER — Encounter (HOSPITAL_COMMUNITY): Payer: Self-pay | Admitting: *Deleted

## 2011-03-29 DIAGNOSIS — M109 Gout, unspecified: Secondary | ICD-10-CM | POA: Insufficient documentation

## 2011-03-29 DIAGNOSIS — Z8673 Personal history of transient ischemic attack (TIA), and cerebral infarction without residual deficits: Secondary | ICD-10-CM | POA: Insufficient documentation

## 2011-03-29 DIAGNOSIS — I428 Other cardiomyopathies: Secondary | ICD-10-CM | POA: Insufficient documentation

## 2011-03-29 DIAGNOSIS — I129 Hypertensive chronic kidney disease with stage 1 through stage 4 chronic kidney disease, or unspecified chronic kidney disease: Secondary | ICD-10-CM | POA: Insufficient documentation

## 2011-03-29 DIAGNOSIS — I509 Heart failure, unspecified: Secondary | ICD-10-CM | POA: Insufficient documentation

## 2011-03-29 DIAGNOSIS — N184 Chronic kidney disease, stage 4 (severe): Secondary | ICD-10-CM | POA: Insufficient documentation

## 2011-03-29 DIAGNOSIS — C649 Malignant neoplasm of unspecified kidney, except renal pelvis: Secondary | ICD-10-CM | POA: Insufficient documentation

## 2011-03-29 DIAGNOSIS — E119 Type 2 diabetes mellitus without complications: Secondary | ICD-10-CM | POA: Insufficient documentation

## 2011-03-29 HISTORY — DX: Cerebral infarction, unspecified: I63.9

## 2011-03-29 HISTORY — PX: AV FISTULA PLACEMENT: SHX1204

## 2011-03-29 LAB — SURGICAL PCR SCREEN
MRSA, PCR: NEGATIVE
Staphylococcus aureus: NEGATIVE

## 2011-03-29 LAB — GLUCOSE, CAPILLARY: Glucose-Capillary: 95 mg/dL (ref 70–99)

## 2011-03-29 LAB — POCT I-STAT 4, (NA,K, GLUC, HGB,HCT)
Glucose, Bld: 111 mg/dL — ABNORMAL HIGH (ref 70–99)
HCT: 31 % — ABNORMAL LOW (ref 39.0–52.0)
Hemoglobin: 10.5 g/dL — ABNORMAL LOW (ref 13.0–17.0)
Potassium: 4.5 mEq/L (ref 3.5–5.1)
Sodium: 140 mEq/L (ref 135–145)

## 2011-03-29 SURGERY — ARTERIOVENOUS (AV) FISTULA CREATION
Anesthesia: Monitor Anesthesia Care | Site: Arm Lower | Laterality: Left | Wound class: Clean

## 2011-03-29 MED ORDER — SODIUM CHLORIDE 0.9 % IV SOLN
INTRAVENOUS | Status: DC | PRN
  Administered 2011-03-29: 11:00:00 via INTRAVENOUS

## 2011-03-29 MED ORDER — BUPIVACAINE HCL (PF) 0.5 % IJ SOLN
INTRAMUSCULAR | Status: DC | PRN
  Administered 2011-03-29: 8 mL

## 2011-03-29 MED ORDER — OXYCODONE HCL 5 MG PO TABS: 5.0000 mg | ORAL_TABLET | ORAL | Status: DC | PRN

## 2011-03-29 MED ORDER — SODIUM CHLORIDE 0.9 % IR SOLN
Status: DC | PRN
  Administered 2011-03-29: 1000 mL

## 2011-03-29 MED ORDER — LIDOCAINE-EPINEPHRINE (PF) 1 %-1:200000 IJ SOLN
INTRAMUSCULAR | Status: DC | PRN
  Administered 2011-03-29: 8 mL

## 2011-03-29 MED ORDER — CEFAZOLIN SODIUM 1-5 GM-% IV SOLN
INTRAVENOUS | Status: DC | PRN
  Administered 2011-03-29: 2 g via INTRAVENOUS

## 2011-03-29 MED ORDER — MIDAZOLAM HCL 5 MG/5ML IJ SOLN
INTRAMUSCULAR | Status: DC | PRN
  Administered 2011-03-29: 2 mg via INTRAVENOUS

## 2011-03-29 MED ORDER — LACTATED RINGERS IV SOLN: INTRAVENOUS | Status: DC

## 2011-03-29 MED ORDER — SODIUM CHLORIDE 0.9 % IV SOLN
INTRAVENOUS | Status: DC
  Administered 2011-03-29: 10:00:00 via INTRAVENOUS

## 2011-03-29 MED ORDER — SODIUM CHLORIDE 0.9 % IR SOLN
Status: DC | PRN
  Administered 2011-03-29: 12:00:00

## 2011-03-29 MED ORDER — FENTANYL CITRATE 0.05 MG/ML IJ SOLN
INTRAMUSCULAR | Status: DC | PRN
  Administered 2011-03-29: 25 ug via INTRAVENOUS
  Administered 2011-03-29: 50 ug via INTRAVENOUS

## 2011-03-29 MED ORDER — PROPOFOL 10 MG/ML IV EMUL
INTRAVENOUS | Status: DC | PRN
  Administered 2011-03-29: 50 ug/kg/min via INTRAVENOUS

## 2011-03-29 MED ORDER — ONDANSETRON HCL 4 MG/2ML IJ SOLN
INTRAMUSCULAR | Status: DC | PRN
  Administered 2011-03-29: 4 mg via INTRAVENOUS

## 2011-03-29 MED ORDER — THROMBIN 20000 UNITS EX KIT
PACK | CUTANEOUS | Status: DC | PRN
  Administered 2011-03-29: 12:00:00 via TOPICAL

## 2011-03-29 SURGICAL SUPPLY — 40 items
ADH SKN CLS APL DERMABOND .7 (GAUZE/BANDAGES/DRESSINGS) ×1
CANISTER SUCTION 2500CC (MISCELLANEOUS) ×2 IMPLANT
CLIP TI MEDIUM 6 (CLIP) ×2 IMPLANT
CLIP TI WIDE RED SMALL 6 (CLIP) ×2 IMPLANT
CLOTH BEACON ORANGE TIMEOUT ST (SAFETY) ×2 IMPLANT
COVER PROBE W GEL 5X96 (DRAPES) IMPLANT
COVER SURGICAL LIGHT HANDLE (MISCELLANEOUS) ×4 IMPLANT
DECANTER SPIKE VIAL GLASS SM (MISCELLANEOUS) ×2 IMPLANT
DERMABOND ADVANCED (GAUZE/BANDAGES/DRESSINGS) ×1
DERMABOND ADVANCED .7 DNX12 (GAUZE/BANDAGES/DRESSINGS) ×1 IMPLANT
DRAIN PENROSE 1/2X12 LTX STRL (WOUND CARE) IMPLANT
ELECT REM PT RETURN 9FT ADLT (ELECTROSURGICAL) ×2
ELECTRODE REM PT RTRN 9FT ADLT (ELECTROSURGICAL) ×1 IMPLANT
GLOVE BIO SURGEON STRL SZ7 (GLOVE) ×2 IMPLANT
GLOVE BIOGEL PI IND STRL 6.5 (GLOVE) IMPLANT
GLOVE BIOGEL PI IND STRL 7.0 (GLOVE) IMPLANT
GLOVE BIOGEL PI IND STRL 7.5 (GLOVE) ×1 IMPLANT
GLOVE BIOGEL PI INDICATOR 6.5 (GLOVE) ×1
GLOVE BIOGEL PI INDICATOR 7.0 (GLOVE) ×2
GLOVE BIOGEL PI INDICATOR 7.5 (GLOVE) ×1
GLOVE SS BIOGEL STRL SZ 6 (GLOVE) IMPLANT
GLOVE SS BIOGEL STRL SZ 6.5 (GLOVE) IMPLANT
GLOVE SUPERSENSE BIOGEL SZ 6 (GLOVE) ×1
GLOVE SUPERSENSE BIOGEL SZ 6.5 (GLOVE) ×1
GOWN STRL NON-REIN LRG LVL3 (GOWN DISPOSABLE) ×4 IMPLANT
KIT BASIN OR (CUSTOM PROCEDURE TRAY) ×2 IMPLANT
KIT ROOM TURNOVER OR (KITS) ×2 IMPLANT
NS IRRIG 1000ML POUR BTL (IV SOLUTION) ×2 IMPLANT
PACK CV ACCESS (CUSTOM PROCEDURE TRAY) ×2 IMPLANT
PAD ARMBOARD 7.5X6 YLW CONV (MISCELLANEOUS) ×2 IMPLANT
SPONGE SURGIFOAM ABS GEL 100 (HEMOSTASIS) IMPLANT
SUT MNCRL AB 4-0 PS2 18 (SUTURE) ×2 IMPLANT
SUT PROLENE 6 0 BV (SUTURE) IMPLANT
SUT PROLENE 7 0 BV 1 (SUTURE) ×2 IMPLANT
SUT VIC AB 3-0 SH 27 (SUTURE) ×2
SUT VIC AB 3-0 SH 27X BRD (SUTURE) ×1 IMPLANT
TOWEL OR 17X24 6PK STRL BLUE (TOWEL DISPOSABLE) ×2 IMPLANT
TOWEL OR 17X26 10 PK STRL BLUE (TOWEL DISPOSABLE) ×2 IMPLANT
UNDERPAD 30X30 INCONTINENT (UNDERPADS AND DIAPERS) ×2 IMPLANT
WATER STERILE IRR 1000ML POUR (IV SOLUTION) ×2 IMPLANT

## 2011-03-29 NOTE — Anesthesia Procedure Notes (Signed)
Procedures

## 2011-03-29 NOTE — Transfer of Care (Signed)
Immediate Anesthesia Transfer of Care Note  Patient: [name]  Procedure(s) Performed:  ARTERIOVENOUS (AV) FISTULA CREATION - LEFT RADIOCEPHALIC , Arteriovenous [id number])  Patient Location: PACU and Short Stay  Anesthesia Type: MAC  Level of Consciousness: awake, alert  and oriented  Airway & Oxygen Therapy: Patient Spontanous Breathing  Post-op Assessment: Report given to PACU RN and Post -op Vital signs reviewed and stable  Post vital signs: stable  Complications: No apparent anesthesia complications

## 2011-03-29 NOTE — Anesthesia Preprocedure Evaluation (Addendum)
Anesthesia Evaluation   Patient awake    Reviewed: Allergy & Precautions, H&P , NPO status , Patient's Chart, lab work & pertinent test results, reviewed documented beta blocker date and time   Airway Mallampati: II      Dental  (+) Teeth Intact   Pulmonary  clear to auscultation        Cardiovascular Regular Normal    Neuro/Psych    GI/Hepatic   Endo/Other    Renal/GU      Musculoskeletal   Abdominal   Peds  Hematology   Anesthesia Other Findings   Reproductive/Obstetrics                          Anesthesia Physical Anesthesia Plan  ASA: III  Anesthesia Plan: MAC   Post-op Pain Management:    Induction: Intravenous  Airway Management Planned: LMA  Additional Equipment:   Intra-op Plan:   Post-operative Plan:   Informed Consent: I have reviewed the patients History and Physical, chart, labs and discussed the procedure including the risks, benefits and alternatives for the proposed anesthesia with the patient or authorized representative who has indicated his/her understanding and acceptance.   Dental advisory given  Plan Discussed with: CRNA and Surgeon  Anesthesia Plan Comments: (50 yo AA male  1. ESRD has not started HD potassium- 4.5 2. CAD s/p Stent insertions 3. Ischemic cardiomyopathy EF 20-25% 4. H/O renal cell ca s/p R. Nephrectomy 12 yrs ago 5. Type 2 DM CBG 111 6. HTN 7. Obesity  Plan GA with LMA)       Anesthesia Quick Evaluation

## 2011-03-29 NOTE — Op Note (Signed)
OPERATIVE NOTE   PROCEDURE: left radiocephaic arteriovenous fistula placement  PRE-OPERATIVE DIAGNOSIS: CKD IV  POST-OPERATIVE DIAGNOSIS: same as above   SURGEON: CHEN,BRIAN LIANG-YU, MD  ANESTHESIA: general  ESTIMATED BLOOD LOSS: 30 cc  FINDING(S): Palpable thrill and weakly palpable radial pulse at end of case  SPECIMEN(S):  none  INDICATIONS:   [name] is a 50 y.o. male who  presents with CKD IV.  The patient is scheduled for left radiocephalic arteriovenous vs. brachiocephalic fistula placement.  The patient is aware the risks include but are not limited to: bleeding, infection, steal syndrome, nerve damage, ischemic monomelic neuropathy, failure to mature, and need for additional procedures.  The patient is aware of the risks of the procedure and elects to proceed forward.  DESCRIPTION: After full informed written consent was obtained from the patient, the patient was brought back to the operating room and placed supine upon the operating table.  Prior to induction, the patient received IV antibiotics.   After obtaining adequate anesthesia, the patient was then prepped and draped in the standard fashion for a left arm access procedure.  I turned my attention first to identifying the patient's distal cephalic vein and radial artery.  Using SonoSite guidance, the location of these vessels were marked out on the skin.   At this point, I injected local anesthetic to obtain a field block of the wrist.  In total, I injected about 16 mL of a 1:1 mixture of 0.5% Marcaine without epinephrine and 1% lidocaine with epinephrine.  I made a tranverse incision at the level of the wrist and dissected through the subcutaneous tissue and fascia to gain exposure of the radial artery.  This was noted to be 3-4 mm in diameter externally.  This was dissected out proximally and distally and controlled with vessel loops .  I then dissected out the cephalic vein.  This was noted to be 3-4 mm in diameter  externally.  The distal segment of the vein was ligated with a  2-0 silk, and the vein was transected.  The proximal segment was iinterrogated with serial dilators.  The vein accepted up to a 4 mm dilator without any difficulty.  I then instilled the heparinized saline into the vein and clamped it.  At this point, I reset my exposure of the radial artery and placed the artery under tension proximally and distally.  I made an arteriotomy with a #11 blade, and then I extended the arteriotomy with a Potts scissor.  I injected heparinized saline proximal and distal to this arteriotomy.  The vein was then sewn to the artery in an end-to-side configuration with a running stitch of 7-0 Prolene.  Prior to completing this anastomosis, I allowed the vein and artery to backbleed.  There was no evidence of clot from any vessels.  I completed the anastomosis in the usual fashion and then released all vessel loops and clamps.  There was a palpable  thrill in the venous outflow, and there was a weakly palpable radial pulse.  At this point, I irrigated out the surgical wound.  There was no further active bleeding.  The subcutaneous tissue was reapproximated with a running stitch of 3-0 Vicryl.  The skin was then reapproximated with a running subcuticular stitch of 4-0 Vicryl.  The skin was then cleaned, dried, and reinforced with Dermabond.  The patient tolerated this procedure well.   COMPLICATIONS: none   CONDITION: stable  Adele Barthel, MD [facility] [place] Office: [phone] Pager:  [phone]  03/29/2011, 12:50 PM

## 2011-03-29 NOTE — Brief Op Note (Signed)
OPERATIVE NOTE   PROCEDURE: Left radiocephalic arteriovenous fistula   PRE-OPERATIVE DIAGNOSIS: CKD IV  POST-OPERATIVE DIAGNOSIS: same as above   SURGEON: Adele Barthel, MD  ANESTHESIA: general  ESTIMATED BLOOD LOSS: 30 cc  FINDING(S): Palpable thrill and weakly palpable radial pulse at end of case  SPECIMEN(S):  none  COMPLICATIONS: none  CONDITION: stable   Adele Barthel, MD [facility] [place] Office: [phone] Pager: [phone]  03/29/2011, 12:49 PM

## 2011-03-29 NOTE — Anesthesia Postprocedure Evaluation (Signed)
  Anesthesia Post-op Note  Patient: [name]  Procedure(s) Performed:  ARTERIOVENOUS (AV) FISTULA CREATION - LEFT RADIOCEPHALIC , Arteriovenous [id number])  Patient Location: PACU and Short Stay  Anesthesia Type: MAC  Level of Consciousness: awake, alert  and oriented  Airway and Oxygen Therapy: Patient Spontanous Breathing  Post-op Pain: none  Post-op Assessment: Post-op Vital signs reviewed and Patient's Cardiovascular Status Stable  Post-op Vital Signs: stable  Complications: No apparent anesthesia complications

## 2011-04-02 MED FILL — Mupirocin Oint 2%: CUTANEOUS | Qty: 22 | Status: AC

## 2011-04-07 MED FILL — Lidocaine Inj 1% w/ Epinephrine-1:200000 (PF): INTRAMUSCULAR | Qty: 30 | Status: AC

## 2011-04-07 MED FILL — Bupivacaine HCl Preservative Free (PF) Inj 0.5%: INTRAMUSCULAR | Qty: 30 | Status: AC

## 2011-04-07 MED FILL — Thrombin For Soln Kit 20000 Unit: CUTANEOUS | Qty: 1 | Status: AC

## 2011-04-08 ENCOUNTER — Encounter (HOSPITAL_COMMUNITY): Payer: Self-pay | Admitting: Vascular Surgery

## 2011-04-29 ENCOUNTER — Encounter: Payer: Self-pay | Admitting: Vascular Surgery

## 2011-04-29 ENCOUNTER — Other Ambulatory Visit (HOSPITAL_COMMUNITY): Payer: Self-pay | Admitting: *Deleted

## 2011-04-30 ENCOUNTER — Encounter: Payer: Self-pay | Admitting: Vascular Surgery

## 2011-04-30 ENCOUNTER — Ambulatory Visit (INDEPENDENT_AMBULATORY_CARE_PROVIDER_SITE_OTHER): Payer: BC Managed Care – PPO | Admitting: Vascular Surgery

## 2011-04-30 ENCOUNTER — Encounter (HOSPITAL_COMMUNITY)
Admission: RE | Admit: 2011-04-30 | Discharge: 2011-04-30 | Disposition: A | Discharge status: Home / Self Care | Payer: BC Managed Care – PPO | Source: Ambulatory Visit | Attending: Nephrology | Admitting: Nephrology

## 2011-04-30 VITALS — BP 141/72 | HR 75 | Resp 16 | Ht 73.0 in | Wt 293.0 lb

## 2011-04-30 DIAGNOSIS — D638 Anemia in other chronic diseases classified elsewhere: Secondary | ICD-10-CM | POA: Insufficient documentation

## 2011-04-30 DIAGNOSIS — N184 Chronic kidney disease, stage 4 (severe): Secondary | ICD-10-CM

## 2011-04-30 LAB — POCT HEMOGLOBIN-HEMACUE: Hemoglobin: 11.6 g/dL — ABNORMAL LOW (ref 13.0–17.0)

## 2011-04-30 MED ORDER — EPOETIN ALFA 20000 UNIT/ML IJ SOLN
INTRAMUSCULAR | Status: AC
  Administered 2011-04-30: 20000 [IU]
  Filled 2011-04-30: qty 1

## 2011-04-30 MED ORDER — EPOETIN ALFA 20000 UNIT/ML IJ SOLN: 20000.0000 [IU] | INTRAMUSCULAR | Status: DC

## 2011-04-30 NOTE — Progress Notes (Signed)
[facility] [place]  Postoperative Access Visit  History of Present Illness  [name] is a 50 y.o. year old male who presents for postoperative follow-up for: L RC AVF (Date: 03/29/11).  The patient's wounds are healed.  The patient notes no steal symptoms.  The patient is able to complete their activities of daily living.  The patient's current symptoms are: mild numbness in palm along great thumb.  Physical Examination  Filed Vitals:   04/30/11 0907  BP: 141/72  Pulse: 75  Resp: 16   LUE: Incision is healed, skin feels warm, hand grip is 5/5, sensation in digits is grossly intact, palpable thrill in distal 1/3 of arm, bruit can be auscultated throughout  Medical Decision Making  [name] is a 77 y.o. year old male who presents s/p L RC AVF.  The patient's access is appropriately maturing.  We will obtain a L arm access duplex in 1 month to check progression.  I suspected based on exam some side branches may need to be ligated or the proximal vein may need superficialization.  Thank you for allowing [place] to participate in this patient's care.  Adele Barthel, MD [facility] [place] Office: [phone] Pager: [phone]

## 2011-05-14 ENCOUNTER — Encounter (HOSPITAL_COMMUNITY): Payer: BC Managed Care – PPO

## 2011-05-19 ENCOUNTER — Encounter (HOSPITAL_COMMUNITY): Payer: Self-pay | Admitting: *Deleted

## 2011-05-19 ENCOUNTER — Inpatient Hospital Stay (HOSPITAL_COMMUNITY)
Admission: EM | Admit: 2011-05-19 | Discharge: 2011-05-24 | DRG: 544 | Disposition: A | Discharge status: Home / Self Care | Payer: BC Managed Care – PPO | Source: Ambulatory Visit | Attending: Cardiovascular Disease | Admitting: Cardiovascular Disease

## 2011-05-19 ENCOUNTER — Emergency Department (HOSPITAL_COMMUNITY): Payer: BC Managed Care – PPO

## 2011-05-19 ENCOUNTER — Encounter: Payer: Self-pay | Admitting: Cardiology

## 2011-05-19 ENCOUNTER — Other Ambulatory Visit: Payer: Self-pay

## 2011-05-19 DIAGNOSIS — Z6838 Body mass index (BMI) 38.0-38.9, adult: Secondary | ICD-10-CM

## 2011-05-19 DIAGNOSIS — I5042 Chronic combined systolic (congestive) and diastolic (congestive) heart failure: Secondary | ICD-10-CM | POA: Diagnosis present

## 2011-05-19 DIAGNOSIS — N189 Chronic kidney disease, unspecified: Secondary | ICD-10-CM

## 2011-05-19 DIAGNOSIS — N179 Acute kidney failure, unspecified: Secondary | ICD-10-CM | POA: Diagnosis present

## 2011-05-19 DIAGNOSIS — R0602 Shortness of breath: Secondary | ICD-10-CM

## 2011-05-19 DIAGNOSIS — I428 Other cardiomyopathies: Secondary | ICD-10-CM | POA: Diagnosis present

## 2011-05-19 DIAGNOSIS — I252 Old myocardial infarction: Secondary | ICD-10-CM

## 2011-05-19 DIAGNOSIS — Z85528 Personal history of other malignant neoplasm of kidney: Secondary | ICD-10-CM

## 2011-05-19 DIAGNOSIS — I10 Essential (primary) hypertension: Secondary | ICD-10-CM | POA: Diagnosis present

## 2011-05-19 DIAGNOSIS — E669 Obesity, unspecified: Secondary | ICD-10-CM | POA: Diagnosis present

## 2011-05-19 DIAGNOSIS — E875 Hyperkalemia: Secondary | ICD-10-CM | POA: Diagnosis present

## 2011-05-19 DIAGNOSIS — M109 Gout, unspecified: Secondary | ICD-10-CM | POA: Diagnosis present

## 2011-05-19 DIAGNOSIS — N185 Chronic kidney disease, stage 5: Secondary | ICD-10-CM | POA: Diagnosis present

## 2011-05-19 DIAGNOSIS — K429 Umbilical hernia without obstruction or gangrene: Secondary | ICD-10-CM

## 2011-05-19 DIAGNOSIS — I5023 Acute on chronic systolic (congestive) heart failure: Principal | ICD-10-CM | POA: Diagnosis present

## 2011-05-19 DIAGNOSIS — I2510 Atherosclerotic heart disease of native coronary artery without angina pectoris: Secondary | ICD-10-CM | POA: Diagnosis present

## 2011-05-19 DIAGNOSIS — D649 Anemia, unspecified: Secondary | ICD-10-CM | POA: Diagnosis present

## 2011-05-19 DIAGNOSIS — R1013 Epigastric pain: Secondary | ICD-10-CM | POA: Diagnosis present

## 2011-05-19 DIAGNOSIS — I509 Heart failure, unspecified: Secondary | ICD-10-CM

## 2011-05-19 DIAGNOSIS — Z7982 Long term (current) use of aspirin: Secondary | ICD-10-CM

## 2011-05-19 DIAGNOSIS — E119 Type 2 diabetes mellitus without complications: Secondary | ICD-10-CM | POA: Diagnosis present

## 2011-05-19 DIAGNOSIS — I120 Hypertensive chronic kidney disease with stage 5 chronic kidney disease or end stage renal disease: Secondary | ICD-10-CM | POA: Diagnosis present

## 2011-05-19 DIAGNOSIS — M1A9XX0 Chronic gout, unspecified, without tophus (tophi): Secondary | ICD-10-CM | POA: Diagnosis present

## 2011-05-19 DIAGNOSIS — R197 Diarrhea, unspecified: Secondary | ICD-10-CM | POA: Diagnosis present

## 2011-05-19 HISTORY — DX: Umbilical hernia without obstruction or gangrene: K42.9

## 2011-05-19 HISTORY — DX: Shortness of breath: R06.02

## 2011-05-19 LAB — DIFFERENTIAL
Basophils Absolute: 0 10*3/uL (ref 0.0–0.1)
Basophils Relative: 0 % (ref 0–1)
Eosinophils Absolute: 0.3 10*3/uL (ref 0.0–0.7)
Eosinophils Relative: 6 % — ABNORMAL HIGH (ref 0–5)
Lymphocytes Relative: 28 % (ref 12–46)
Lymphs Abs: 1.3 10*3/uL (ref 0.7–4.0)
Monocytes Absolute: 0.4 10*3/uL (ref 0.1–1.0)
Monocytes Relative: 9 % (ref 3–12)
Neutro Abs: 2.6 10*3/uL (ref 1.7–7.7)
Neutrophils Relative %: 57 % (ref 43–77)

## 2011-05-19 LAB — URINALYSIS, ROUTINE W REFLEX MICROSCOPIC
Bilirubin Urine: NEGATIVE
Glucose, UA: NEGATIVE mg/dL
Ketones, ur: NEGATIVE mg/dL
Leukocytes, UA: NEGATIVE
Nitrite: NEGATIVE
Protein, ur: >300 mg/dL — AB
Specific Gravity, Urine: 1.013 (ref 1.005–1.030)
Urobilinogen, UA: 0.2 mg/dL (ref 0.0–1.0)
pH: 6.5 (ref 5.0–8.0)

## 2011-05-19 LAB — CBC
HCT: 34 % — ABNORMAL LOW (ref 39.0–52.0)
HCT: 34.1 % — ABNORMAL LOW (ref 39.0–52.0)
Hemoglobin: 11.1 g/dL — ABNORMAL LOW (ref 13.0–17.0)
Hemoglobin: 11.3 g/dL — ABNORMAL LOW (ref 13.0–17.0)
MCH: 28.1 pg (ref 26.0–34.0)
MCH: 28.4 pg (ref 26.0–34.0)
MCHC: 32.6 g/dL (ref 30.0–36.0)
MCHC: 33.1 g/dL (ref 30.0–36.0)
MCV: 86.1 fL (ref 78.0–100.0)
MCV: 85.7 fL (ref 78.0–100.0)
Platelets: 210 10*3/uL (ref 150–400)
Platelets: 215 10*3/uL (ref 150–400)
RBC: 3.95 MIL/uL — ABNORMAL LOW (ref 4.22–5.81)
RBC: 3.98 MIL/uL — ABNORMAL LOW (ref 4.22–5.81)
RDW: 15.9 % — ABNORMAL HIGH (ref 11.5–15.5)
RDW: 16 % — ABNORMAL HIGH (ref 11.5–15.5)
WBC: 4.6 10*3/uL (ref 4.0–10.5)
WBC: 4.9 10*3/uL (ref 4.0–10.5)

## 2011-05-19 LAB — BASIC METABOLIC PANEL
BUN: 46 mg/dL — ABNORMAL HIGH (ref 6–23)
CO2: 24 mEq/L (ref 19–32)
Calcium: 9.6 mg/dL (ref 8.4–10.5)
Chloride: 106 mEq/L (ref 96–112)
Creatinine, Ser: 6 mg/dL — ABNORMAL HIGH (ref 0.50–1.35)
GFR calc Af Amer: 12 mL/min — ABNORMAL LOW (ref >90)
GFR calc non Af Amer: 10 mL/min — ABNORMAL LOW (ref >90)
Glucose, Bld: 88 mg/dL (ref 70–99)
Potassium: 5.5 mEq/L — ABNORMAL HIGH (ref 3.5–5.1)
Sodium: 138 mEq/L (ref 135–145)

## 2011-05-19 LAB — URINE MICROSCOPIC-ADD ON

## 2011-05-19 LAB — CREATININE, SERUM
Creatinine, Ser: 5.94 mg/dL — ABNORMAL HIGH (ref 0.50–1.35)
GFR calc Af Amer: 12 mL/min — ABNORMAL LOW (ref >90)
GFR calc non Af Amer: 10 mL/min — ABNORMAL LOW (ref >90)

## 2011-05-19 LAB — PRO B NATRIURETIC PEPTIDE: Pro B Natriuretic peptide (BNP): 1556 pg/mL — ABNORMAL HIGH (ref 0–125)

## 2011-05-19 MED ORDER — ENOXAPARIN SODIUM 30 MG/0.3ML ~~LOC~~ SOLN
30.0000 mg | SUBCUTANEOUS | Status: DC
  Administered 2011-05-19 – 2011-05-20 (×2): 30 mg via SUBCUTANEOUS
  Filled 2011-05-19 (×3): qty 0.3

## 2011-05-19 MED ORDER — SODIUM CHLORIDE 0.9 % IV BOLUS (SEPSIS): 500.0000 mL | INTRAVENOUS | Status: DC

## 2011-05-19 MED ORDER — SODIUM CHLORIDE 0.9 % IV SOLN: 250.0000 mL | INTRAVENOUS | Status: DC | PRN

## 2011-05-19 MED ORDER — SODIUM CHLORIDE 0.9 % IV BOLUS (SEPSIS): 1000.0000 mL | Freq: Once | INTRAVENOUS | Status: DC

## 2011-05-19 MED ORDER — SODIUM CHLORIDE 0.9 % IJ SOLN
3.0000 mL | INTRAMUSCULAR | Status: DC | PRN
  Administered 2011-05-21: 3 mL via INTRAVENOUS

## 2011-05-19 MED ORDER — CALCITRIOL 0.5 MCG PO CAPS
0.5000 ug | ORAL_CAPSULE | Freq: Every day | ORAL | Status: DC
  Administered 2011-05-19 – 2011-05-24 (×6): 0.5 ug via ORAL
  Filled 2011-05-19 (×6): qty 1

## 2011-05-19 MED ORDER — SODIUM CHLORIDE 0.9 % IV SOLN
100.0000 mg | Freq: Every day | INTRAVENOUS | Status: DC
  Filled 2011-05-19: qty 100

## 2011-05-19 MED ORDER — CARVEDILOL 3.125 MG PO TABS
3.1250 mg | ORAL_TABLET | Freq: Two times a day (BID) | ORAL | Status: DC
  Administered 2011-05-19 – 2011-05-20 (×3): 3.125 mg via ORAL
  Filled 2011-05-19 (×5): qty 1

## 2011-05-19 MED ORDER — ALLOPURINOL 100 MG PO TABS
100.0000 mg | ORAL_TABLET | Freq: Every day | ORAL | Status: DC
  Administered 2011-05-19 – 2011-05-24 (×6): 100 mg via ORAL
  Filled 2011-05-19 (×7): qty 1

## 2011-05-19 MED ORDER — HYDRALAZINE HCL 50 MG PO TABS
50.0000 mg | ORAL_TABLET | Freq: Three times a day (TID) | ORAL | Status: DC
  Administered 2011-05-19 – 2011-05-24 (×13): 50 mg via ORAL
  Filled 2011-05-19 (×18): qty 1

## 2011-05-19 MED ORDER — SODIUM CHLORIDE 0.9 % IJ SOLN
3.0000 mL | Freq: Two times a day (BID) | INTRAMUSCULAR | Status: DC
  Administered 2011-05-19 – 2011-05-22 (×4): 3 mL via INTRAVENOUS

## 2011-05-19 MED ORDER — ISOSORBIDE DINITRATE 20 MG PO TABS
20.0000 mg | ORAL_TABLET | Freq: Three times a day (TID) | ORAL | Status: DC
  Administered 2011-05-19 – 2011-05-24 (×15): 20 mg via ORAL
  Filled 2011-05-19 (×17): qty 1

## 2011-05-19 MED ORDER — ONDANSETRON HCL 4 MG/2ML IJ SOLN: 4.0000 mg | Freq: Four times a day (QID) | INTRAMUSCULAR | Status: DC | PRN

## 2011-05-19 MED ORDER — OXYCODONE-ACETAMINOPHEN 5-325 MG PO TABS
1.0000 | ORAL_TABLET | Freq: Four times a day (QID) | ORAL | Status: DC | PRN
  Administered 2011-05-19 – 2011-05-23 (×4): 1 via ORAL
  Filled 2011-05-19 (×4): qty 1

## 2011-05-19 MED ORDER — ASPIRIN 81 MG PO CHEW
81.0000 mg | CHEWABLE_TABLET | Freq: Every day | ORAL | Status: DC
  Administered 2011-05-19 – 2011-05-24 (×6): 81 mg via ORAL
  Filled 2011-05-19 (×4): qty 1

## 2011-05-19 MED ORDER — ACETAMINOPHEN 325 MG PO TABS: 650.0000 mg | ORAL_TABLET | ORAL | Status: DC | PRN

## 2011-05-19 MED ORDER — CLONIDINE HCL 0.2 MG PO TABS
0.2000 mg | ORAL_TABLET | Freq: Three times a day (TID) | ORAL | Status: DC
  Administered 2011-05-19 – 2011-05-23 (×13): 0.2 mg via ORAL
  Filled 2011-05-19 (×17): qty 1

## 2011-05-19 MED ORDER — COLCHICINE 0.6 MG PO TABS
0.6000 mg | ORAL_TABLET | Freq: Every day | ORAL | Status: DC
  Administered 2011-05-19 – 2011-05-24 (×6): 0.6 mg via ORAL
  Filled 2011-05-19 (×6): qty 1

## 2011-05-19 MED ORDER — SODIUM CHLORIDE 0.9 % IV BOLUS (SEPSIS)
500.0000 mL | INTRAVENOUS | Status: AC
  Administered 2011-05-19: 500 mL via INTRAVENOUS

## 2011-05-19 NOTE — ED Notes (Signed)
C/o left flank pain x 1-2 weeks, nausea no vomiting. Denies UTI Sx's

## 2011-05-19 NOTE — ED Notes (Signed)
Patient denies pain and is resting comfortably. Lab at bedside

## 2011-05-19 NOTE — ED Notes (Signed)
Admitting MD at bedside.

## 2011-05-19 NOTE — ED Notes (Signed)
Denies CP, SOB. NSR on monitor

## 2011-05-19 NOTE — ED Notes (Signed)
Also c/o nausea, no emesis. Hot & cold chills. Reports diarrhea x 1 week, 4-5 times daily. Reports pain intermittent, lasts approx 2-3 minutes at a time

## 2011-05-19 NOTE — ED Notes (Signed)
Patient is resting comfortably. No voiced complaints presently. NAD. Ate most of diet tray. Tolerated well.

## 2011-05-19 NOTE — ED Provider Notes (Signed)
History     CSN: [id number]  Arrival date & time 05/19/11  [id number]   First MD Initiated Contact with Patient 05/19/11 [phone]      Chief Complaint  Patient presents with  . Flank Pain    (Consider location/radiation/quality/duration/timing/severity/associated sxs/prior treatment) HPI Comments: Pt c/o diarrhea x 1 week, stools watery, no blood. Denies abd pain & vomiting, reports cramping & nausea. Denies fevers, NS, chills. Also c/o worsening SOBthe last couple of days. Reports bilateral edema, orthopnea, denies PND, CP.  In addition c/o intermittent L sd flank pain  PCP: Dr. Berdine Addison, DM (not on medication), stroke Kidney: Dr. Mercy Moore; CKD stage IV (renal CA, nephrectomy) , Pt has fistula left arm placed Nov 5th, not currently on Dialysis  Heart: Dr. Janalyn Rouse, [place]   Patient is a 50 y.o. male presenting with flank pain and diarrhea. The history is provided by the patient.  Flank Pain Associated symptoms include abdominal pain and nausea. Pertinent negatives include no arthralgias, chest pain, congestion, coughing, fatigue, fever, myalgias, neck pain, rash, sore throat, vomiting or weakness.  Diarrhea The primary symptoms include abdominal pain, nausea and diarrhea. Primary symptoms do not include fever, weight loss, fatigue, vomiting, melena, hematemesis, hematochezia, dysuria, myalgias, arthralgias or rash. The illness began 6 to 7 days ago. The onset was sudden. The problem has not changed since onset.   Past Medical History  Diagnosis Date  . CHF (congestive heart failure)   . Diabetes mellitus   . Cardiomyopathy   . History of unilateral nephrectomy   . Hypertension   . Cardiomyopathy   . Gout   . Stroke 10/12  . Renal cell carcinoma   . Coronary artery disease   . Myocardial infarction     Past Surgical History  Procedure Date  . Nephrectomy   . Av fistula placement 03/29/2011    Procedure: ARTERIOVENOUS (AV) FISTULA CREATION;  Surgeon: Hinda Lenis, MD;  Location:  [facility];  Service: Vascular;  Laterality: Left;  LEFT RADIOCEPHALIC , Arteriovenous [id number])    Family History  Problem Relation Age of Onset  . Hypertension Mother   . Kidney disease Mother     History  Substance Use Topics  . Smoking status: Never Smoker   . Smokeless tobacco: Never Used  . Alcohol Use: No      Review of Systems  Constitutional: Negative for fever, weight loss and fatigue.  HENT: Negative for congestion, sore throat, neck pain, neck stiffness and sinus pressure.   Eyes: Negative for visual disturbance.  Respiratory: Positive for shortness of breath. Negative for apnea, cough, choking, chest tightness and wheezing.   Cardiovascular: Positive for leg swelling. Negative for chest pain and palpitations.  Gastrointestinal: Positive for nausea, abdominal pain and diarrhea. Negative for vomiting, melena, hematochezia and hematemesis.  Genitourinary: Positive for flank pain. Negative for dysuria.  Musculoskeletal: Negative for myalgias and arthralgias.  Skin: Negative for rash.  Neurological: Negative for facial asymmetry and weakness.    Allergies  Ace inhibitors  Home Medications   Current Outpatient Rx  Name Route Sig Dispense Refill  . ALLOPURINOL 300 MG PO TABS Oral Take 300 mg by mouth daily.      Marland Kitchen AMLODIPINE BESYLATE 10 MG PO TABS Oral Take 10 mg by mouth daily.      . ASPIRIN 325 MG PO TABS Oral Take 325 mg by mouth daily.      Marland Kitchen CALCITRIOL 0.5 MCG PO CAPS Oral Take 0.5 mcg by mouth daily.      Marland Kitchen  CARVEDILOL 25 MG PO TABS Oral Take 25 mg by mouth 2 (two) times daily.     Marland Kitchen CLONIDINE HCL 0.2 MG PO TABS Oral Take 0.2 mg by mouth 3 (three) times daily.      . FUROSEMIDE 80 MG PO TABS Oral Take 80 mg by mouth 2 (two) times daily.     Marland Kitchen HYDRALAZINE HCL 50 MG PO TABS Oral Take 50 mg by mouth 2 (two) times daily.      . ISOSORBIDE DINITRATE 20 MG PO TABS Oral Take 20 mg by mouth 2 (two) times daily.      . OXYCODONE HCL 5 MG PO TABS Oral Take 1 tablet (5  mg total) by mouth every 4 (four) hours as needed for pain. 1-2 PO q4-6 hours prn pain 30 tablet 0    BP 162/101  Pulse 86  Temp(Src) 98.7 F (37.1 C) (Oral)  Resp 20  Ht 6\' 1"  (1.854 m)  Wt 292 lb (132.45 kg)  BMI 38.52 kg/m2  SpO2 99%  Physical Exam  Nursing note and vitals reviewed. Constitutional: He is oriented to person, place, and time. He appears well-developed and well-nourished. No distress.  HENT:  Head: Normocephalic and atraumatic.  Mouth/Throat: Oropharynx is clear and moist. No oropharyngeal exudate.  Eyes: Conjunctivae and EOM are normal. Pupils are equal, round, and reactive to light. No scleral icterus.  Neck: Normal range of motion. Neck supple. No tracheal deviation present. No thyromegaly present.  Cardiovascular: Normal rate, regular rhythm, normal heart sounds and intact distal pulses.   Pulmonary/Chest: Effort normal and breath sounds normal. No stridor. He has no wheezes. He exhibits no tenderness.  Abdominal: Soft. Bowel sounds are normal. He exhibits no mass. There is tenderness (LLQ and Left CVA tender to palpation, not to vibration) in the left lower quadrant. There is no tenderness at McBurney's point and negative Murphy's sign. A hernia is present.    Musculoskeletal: Normal range of motion. He exhibits no edema and no tenderness.  Neurological: He is alert and oriented to person, place, and time. He has normal reflexes. Coordination normal.  Skin: Skin is warm and dry. No rash noted. He is not diaphoretic. No erythema. No pallor.  Psychiatric: He has a normal mood and affect. His behavior is normal.    ED Course  Procedures (including critical care time)  Labs Reviewed - No data to display No results found. CBC    Component Value Date/Time   WBC 4.6 05/19/2011 1050   RBC 3.95* 05/19/2011 1050   HGB 11.1* 05/19/2011 1050   HCT 34.0* 05/19/2011 1050   PLT 210 05/19/2011 1050   MCV 86.1 05/19/2011 1050   MCH 28.1 05/19/2011 1050   MCHC 32.6  05/19/2011 1050   RDW 15.9* 05/19/2011 1050   LYMPHSABS 1.3 05/19/2011 1050   MONOABS 0.4 05/19/2011 1050   EOSABS 0.3 05/19/2011 1050   BASOSABS 0.0 05/19/2011 1050   BMET    Component Value Date/Time   NA 138 05/19/2011 1050   K 5.5* 05/19/2011 1050   CL 106 05/19/2011 1050   CO2 24 05/19/2011 1050   GLUCOSE 88 05/19/2011 1050   BUN 46* 05/19/2011 1050   CREATININE 6.00* 05/19/2011 1050   CALCIUM 9.6 05/19/2011 1050   GFRNONAA 10* 05/19/2011 1050   GFRAA 12* 05/19/2011 1050    Pro BNP: 1556  CT: IMPRESSION:  No acute intra-abdominal abnormality identified.  Fat containing periumbilical hernia.  Status post right nephrectomy.  Cardiomegaly and trace pericardial effusion.  CHEST - 2 VIEW  Comparison: Chest x-ray of 01/18/2011  Findings: There is a moderate cardiomegaly present which is stable. There is mild pulmonary vascular congestion present. No effusion is seen. No acute bony abnormality is noted.  IMPRESSION: Moderate cardiomegaly. Mild pulmonary vascular congestion.     No diagnosis found.  Patient's labs indicate worsening chronic kidney disease and trace pericardial effusion.  Patient to be admitted for observation by Dr. Doylene Canard.  MDM  CKD, CHF, Diarrhea         Verl Dicker, [place] 05/19/11 [place], [place] 05/19/11 1246

## 2011-05-19 NOTE — ED Notes (Signed)
[id number] ready

## 2011-05-19 NOTE — H&P (Signed)
[name] is an 50 y.o. male.   Chief Complaint: Left lower abdominal pain and shortness of breath. HPI: 50 years old black obese male has 1 week history of diarrhea and left lower quadrant abdominal pain. Also has mild leg edema and shortness of breath x 2 days. No chest pain or fever.  Past Medical History  Diagnosis Date  . CHF (congestive heart failure)   . Diabetes mellitus   . Cardiomyopathy   . History of unilateral nephrectomy   . Hypertension   . Cardiomyopathy   . Gout   . Stroke 10/12  . Renal cell carcinoma   . Coronary artery disease   . Myocardial infarction       Past Surgical History  Procedure Date  . Nephrectomy   . Av fistula placement 03/29/2011    Procedure: ARTERIOVENOUS (AV) FISTULA CREATION;  Surgeon: Hinda Lenis, MD;  Location: [facility] OR;  Service: Vascular;  Laterality: Left;  LEFT RADIOCEPHALIC , Arteriovenous [id number])    Family History  Problem Relation Age of Onset  . Hypertension Mother   . Kidney disease Mother    Social History:  reports that he has never smoked. He has never used smokeless tobacco. He reports that he does not drink alcohol or use illicit drugs.  Allergies:  Allergies  Allergen Reactions  . Ace Inhibitors Cough    Medications Prior to Admission  Medication Dose Route Frequency Provider Last Rate Last Dose  . sodium chloride 0.9 % bolus 500 mL  500 mL Intravenous STAT Lisette Paz, PA   500 mL at 05/19/11 1147  . DISCONTD: sodium chloride 0.9 % bolus 1,000 mL  1,000 mL Intravenous Once [place], [place]      . DISCONTD: sodium chloride 0.9 % bolus 500 mL  500 mL Intravenous STAT Lisette Paz, [place]       Medications Prior to Admission  Medication Sig Dispense Refill  . allopurinol (ZYLOPRIM) 300 MG tablet Take 300 mg by mouth daily.        Marland Kitchen amLODipine (NORVASC) 10 MG tablet Take 10 mg by mouth daily.        Marland Kitchen aspirin 325 MG tablet Take 325 mg by mouth daily.        . calcitRIOL (ROCALTROL) 0.5 MCG capsule Take  0.5 mcg by mouth daily.        . carvedilol (COREG) 25 MG tablet Take 25 mg by mouth 2 (two) times daily.       . cloNIDine (CATAPRES) 0.2 MG tablet Take 0.2 mg by mouth 3 (three) times daily.        . furosemide (LASIX) 80 MG tablet Take 80 mg by mouth 2 (two) times daily.       . hydrALAZINE (APRESOLINE) 50 MG tablet Take 50 mg by mouth 2 (two) times daily.        . isosorbide dinitrate (ISORDIL) 20 MG tablet Take 20 mg by mouth 2 (two) times daily.        Marland Kitchen oxyCODONE (ROXICODONE) 5 MG immediate release tablet Take 1 tablet (5 mg total) by mouth every 4 (four) hours as needed for pain. 1-2 PO q4-6 hours prn pain  30 tablet  0    Results for orders placed during the hospital encounter of 05/19/11 (from the past 48 hour(s))  URINALYSIS, ROUTINE W REFLEX MICROSCOPIC     Status: Abnormal   Collection Time   05/19/11 10:42 AM      Component Value Range Comment  Color, Urine YELLOW  YELLOW     APPearance CLEAR  CLEAR     Specific Gravity, Urine 1.013  1.005 - 1.030     pH 6.5  5.0 - 8.0     Glucose, UA NEGATIVE  NEGATIVE (mg/dL)    Hgb urine dipstick TRACE (*) NEGATIVE     Bilirubin Urine NEGATIVE  NEGATIVE     Ketones, ur NEGATIVE  NEGATIVE (mg/dL)    Protein, ur >300 (*) NEGATIVE (mg/dL)    Urobilinogen, UA 0.2  0.0 - 1.0 (mg/dL)    Nitrite NEGATIVE  NEGATIVE     Leukocytes, UA NEGATIVE  NEGATIVE    URINE MICROSCOPIC-ADD ON     Status: Normal   Collection Time   05/19/11 10:42 AM      Component Value Range Comment   Squamous Epithelial / LPF RARE  RARE     WBC, UA 0-2  <3 (WBC/hpf)    RBC / HPF 0-2  <3 (RBC/hpf)   PRO B NATRIURETIC PEPTIDE     Status: Abnormal   Collection Time   05/19/11 10:50 AM      Component Value Range Comment   Pro B Natriuretic peptide (BNP) 1556.0 (*) 0 - 125 (pg/mL)   BASIC METABOLIC PANEL     Status: Abnormal   Collection Time   05/19/11 10:50 AM      Component Value Range Comment   Sodium 138  135 - 145 (mEq/L)    Potassium 5.5 (*) 3.5 - 5.1  (mEq/L)    Chloride 106  96 - 112 (mEq/L)    CO2 24  19 - 32 (mEq/L)    Glucose, Bld 88  70 - 99 (mg/dL)    BUN 46 (*) 6 - 23 (mg/dL)    Creatinine, Ser 6.00 (*) 0.50 - 1.35 (mg/dL)    Calcium 9.6  8.4 - 10.5 (mg/dL)    GFR calc non Af Amer 10 (*) >90 (mL/min)    GFR calc Af Amer 12 (*) >90 (mL/min)   CBC     Status: Abnormal   Collection Time   05/19/11 10:50 AM      Component Value Range Comment   WBC 4.6  4.0 - 10.5 (K/uL)    RBC 3.95 (*) 4.22 - 5.81 (MIL/uL)    Hemoglobin 11.1 (*) 13.0 - 17.0 (g/dL)    HCT 34.0 (*) 39.0 - 52.0 (%)    MCV 86.1  78.0 - 100.0 (fL)    MCH 28.1  26.0 - 34.0 (pg)    MCHC 32.6  30.0 - 36.0 (g/dL)    RDW 15.9 (*) 11.5 - 15.5 (%)    Platelets 210  150 - 400 (K/uL)   DIFFERENTIAL     Status: Abnormal   Collection Time   05/19/11 10:50 AM      Component Value Range Comment   Neutrophils Relative 57  43 - 77 (%)    Neutro Abs 2.6  1.7 - 7.7 (K/uL)    Lymphocytes Relative 28  12 - 46 (%)    Lymphs Abs 1.3  0.7 - 4.0 (K/uL)    Monocytes Relative 9  3 - 12 (%)    Monocytes Absolute 0.4  0.1 - 1.0 (K/uL)    Eosinophils Relative 6 (*) 0 - 5 (%)    Eosinophils Absolute 0.3  0.0 - 0.7 (K/uL)    Basophils Relative 0  0 - 1 (%)    Basophils Absolute 0.0  0.0 - 0.1 (K/uL)  Ct Abdomen Pelvis Wo Contrast  05/19/2011  *RADIOLOGY REPORT*  Clinical Data: Left lower quadrant pain.  CT ABDOMEN AND PELVIS WITHOUT CONTRAST  Technique:  Multidetector CT imaging of the abdomen and pelvis was performed following the standard protocol without intravenous contrast.  Comparison: 11/05/2010 CT  Findings: Cardiomegaly.  Trace pericardial effusion.  Lung bases are clear.  Intra-abdominal organ evaluation is limited without intravenous contrast.  Within this limitation, unremarkable liver, biliary system, spleen, pancreas, adrenal glands.  Status post right nephrectomy.  The left kidney is mildly lobular contour without focal abnormality. No hydronephrosis or hydroureter.  No  bowel obstruction.  No CT evidence for colitis.  Normal appendix.  Large fat containing periumbilical hernia.  No free intraperitoneal air or fluid.  No lymphadenopathy.  Limited vascular evaluation without intravenous contrast demonstrates normal caliber vessels.  Partially decompressed bladder.  L5-S1 DDD.  No acute osseous abnormality identified.  IMPRESSION:  No acute intra-abdominal abnormality identified.  Fat containing periumbilical hernia.  Status post right nephrectomy.  Cardiomegaly and trace pericardial effusion.  Original Report Authenticated By: Suanne Marker, M.D.   Dg Chest 2 View  05/19/2011  *RADIOLOGY REPORT*  Clinical Data: Shortness of breath  CHEST - 2 VIEW  Comparison: Chest x-ray of 01/18/2011  Findings: There is a moderate cardiomegaly present which is stable. There is mild pulmonary vascular congestion present.  No effusion is seen.  No acute bony abnormality is noted.  IMPRESSION: Moderate cardiomegaly.  Mild pulmonary vascular congestion.  Original Report Authenticated By: Joretta Bachelor, M.D.   Review of Systems  Constitutional: Negative for fever, weight loss and fatigue.  HENT: Negative for congestion, sore throat, neck pain, neck stiffness and sinus pressure.  Eyes: Negative for visual disturbance.  Respiratory: Positive for shortness of breath. Negative for apnea, cough, choking, chest tightness and wheezing.  Cardiovascular: Positive for leg swelling. Negative for chest pain and palpitations.  Gastrointestinal: Positive for nausea, abdominal pain and diarrhea. Negative for vomiting, melena, hematochezia and hematemesis.  Genitourinary: Positive for flank pain. Negative for dysuria.  Musculoskeletal: Negative for myalgias and arthralgias.  Skin: Negative for rash.  Neurological: Negative for facial asymmetry and weakness.    Blood pressure 157/103, pulse 79, temperature 98.7 F (37.1 C), temperature source Oral, resp. rate 19, height 6\' 1"  (1.854 m), weight  132.45 kg (292 lb), SpO2 100.00%. Constitutional: He is oriented to person, place, and time. He appears well-developed and well-nourished. No distress.  HENT: Normocephalic and atraumatic. Brown eyes, conj-pink, PERL, EOMI. No scleral icterus. Tongue pink and dry.  Neck: Normal range of motion. Neck supple. No tracheal deviation present. No thyromegaly present.  Cardiovascular: Normal rate, regular rhythm, normal heart sounds and intact distal pulses.  Pulmonary/Chest: Effort normal and breath sounds normal. No stridor. He has no wheezes. He exhibits no tenderness.  Abdominal: Soft. Bowel sounds are normal. He exhibits no mass. There is mild tenderness (LLQ and Left CVA tender to palpation, not to vibration) in the left lower quadrant. There is no tenderness at McBurney's point and negative Murphy's sign. An umbilical hernia is present. Musculoskeletal: Normal range of motion. He exhibits trace edema and no tenderness.  Neurological: He is alert and oriented to person, place, and time. He has normal reflexes. Coordination normal.  Skin: Skin is warm and dry. No rash noted. He is not diaphoretic. No erythema. No pallor.  Psychiatric: He has a normal mood and affect. His behavior is normal.    Assessment/Plan Acute on chronic left heart failure Acute  on chronic renal failure, (CKD, IV) Abdominal pain with diarrhea. DM, II-250.00 Gout Obesity  Admit Renal consult Cardiac enzymes, echocardiogram   Tylin Force S 05/19/2011, 1:05 PM

## 2011-05-19 NOTE — ED Notes (Signed)
Informed patient and/or family of status. Awaiting admitting/consulting MD

## 2011-05-19 NOTE — ED Notes (Signed)
Patient is resting. Returned from [place]

## 2011-05-20 HISTORY — PX: US ECHOCARDIOGRAPHY: HXRAD669

## 2011-05-20 LAB — CBC
HCT: 32.3 % — ABNORMAL LOW (ref 39.0–52.0)
Hemoglobin: 10.7 g/dL — ABNORMAL LOW (ref 13.0–17.0)
MCH: 28.6 pg (ref 26.0–34.0)
MCHC: 33.1 g/dL (ref 30.0–36.0)
MCV: 86.4 fL (ref 78.0–100.0)
Platelets: 225 10*3/uL (ref 150–400)
RBC: 3.74 MIL/uL — ABNORMAL LOW (ref 4.22–5.81)
RDW: 16.4 % — ABNORMAL HIGH (ref 11.5–15.5)
WBC: 4 10*3/uL (ref 4.0–10.5)

## 2011-05-20 LAB — CARDIAC PANEL(CRET KIN+CKTOT+MB+TROPI)
CK, MB: 3.1 ng/mL (ref 0.3–4.0)
CK, MB: 3.6 ng/mL (ref 0.3–4.0)
CK, MB: 4.3 ng/mL — ABNORMAL HIGH (ref 0.3–4.0)
Relative Index: 3 — ABNORMAL HIGH (ref 0.0–2.5)
Relative Index: 3.8 — ABNORMAL HIGH (ref 0.0–2.5)
Relative Index: INVALID (ref 0.0–2.5)
Total CK: 103 U/L (ref 7–232)
Total CK: 113 U/L (ref 7–232)
Total CK: 95 U/L (ref 7–232)
Troponin I: <0.3 ng/mL (ref <0.30)
Troponin I: <0.3 ng/mL (ref <0.30)
Troponin I: <0.3 ng/mL (ref <0.30)

## 2011-05-20 LAB — BASIC METABOLIC PANEL
BUN: 47 mg/dL — ABNORMAL HIGH (ref 6–23)
CO2: 25 mEq/L (ref 19–32)
Calcium: 9 mg/dL (ref 8.4–10.5)
Chloride: 105 mEq/L (ref 96–112)
Creatinine, Ser: 6.14 mg/dL — ABNORMAL HIGH (ref 0.50–1.35)
GFR calc Af Amer: 11 mL/min — ABNORMAL LOW (ref >90)
GFR calc non Af Amer: 10 mL/min — ABNORMAL LOW (ref >90)
Glucose, Bld: 104 mg/dL — ABNORMAL HIGH (ref 70–99)
Potassium: 4.9 mEq/L (ref 3.5–5.1)
Sodium: 138 mEq/L (ref 135–145)

## 2011-05-20 LAB — PROTIME-INR
INR: 0.93 (ref 0.00–1.49)
Prothrombin Time: 12.7 seconds (ref 11.6–15.2)

## 2011-05-20 LAB — GLUCOSE, CAPILLARY
Glucose-Capillary: 113 mg/dL — ABNORMAL HIGH (ref 70–99)
Glucose-Capillary: 100 mg/dL — ABNORMAL HIGH (ref 70–99)
Glucose-Capillary: 71 mg/dL (ref 70–99)
Glucose-Capillary: 78 mg/dL (ref 70–99)
Glucose-Capillary: 119 mg/dL — ABNORMAL HIGH (ref 70–99)

## 2011-05-20 LAB — URIC ACID: Uric Acid, Serum: 6.6 mg/dL (ref 4.0–7.8)

## 2011-05-20 LAB — TSH: TSH: 2.034 u[IU]/mL (ref 0.350–4.500)

## 2011-05-20 MED ORDER — AMLODIPINE BESYLATE 5 MG PO TABS
5.0000 mg | ORAL_TABLET | Freq: Two times a day (BID) | ORAL | Status: DC
  Administered 2011-05-20 – 2011-05-24 (×9): 5 mg via ORAL
  Filled 2011-05-20 (×10): qty 1

## 2011-05-20 MED ORDER — SODIUM CHLORIDE 0.9 % IJ SOLN
3.0000 mL | Freq: Two times a day (BID) | INTRAMUSCULAR | Status: DC
  Administered 2011-05-20 – 2011-05-23 (×6): 3 mL via INTRAVENOUS

## 2011-05-20 MED ORDER — SODIUM CHLORIDE 0.9 % IJ SOLN: 3.0000 mL | INTRAMUSCULAR | Status: DC | PRN

## 2011-05-20 MED ORDER — DIAZEPAM 5 MG PO TABS
5.0000 mg | ORAL_TABLET | ORAL | Status: AC
  Administered 2011-05-21: 5 mg via ORAL
  Filled 2011-05-20: qty 1

## 2011-05-20 MED ORDER — SODIUM CHLORIDE 0.9 % IV SOLN: 250.0000 mL | INTRAVENOUS | Status: DC | PRN

## 2011-05-20 NOTE — Progress Notes (Signed)
UR Completed.  [name] 05/20/2011 [phone]

## 2011-05-20 NOTE — ED Provider Notes (Signed)
Medical screening examination/treatment/procedure(s) were performed by non-physician practitioner and as supervising physician I was immediately available for consultation/collaboration.   Veryl Speak, MD 05/20/11 [phone]

## 2011-05-20 NOTE — Progress Notes (Signed)
*  PRELIMINARY RESULTS* Echocardiogram 2D Echocardiogram has been performed.  Roxine Caddy [facility] 05/20/2011, 11:14 AM

## 2011-05-20 NOTE — Progress Notes (Signed)
Subjective:  Feeling better but elevated blood pressure and renal function blood test.  Objective:  Vital Signs in the last 24 hours: Temp:  [97.9 F (36.6 C)-99.1 F (37.3 C)] 97.9 F (36.6 C) (12/27 0300) Pulse Rate:  [79-87] 81  (12/27 1048) Cardiac Rhythm:  [-] Normal sinus rhythm (12/27 0856) Resp:  [18-25] 20  (12/27 0300) BP: (157-189)/(92-118) 180/110 mmHg (12/27 1048) SpO2:  [99 %-100 %] 99 % (12/27 0300) Weight:  [132.8 kg (292 lb 12.3 oz)-133.7 kg (294 lb 12.1 oz)] 294 lb 12.1 oz (133.7 kg) (12/27 0300)  Physical Exam: BP Readings from Last 1 Encounters:  05/20/11 180/110    Wt Readings from Last 1 Encounters:  05/20/11 133.7 kg (294 lb 12.1 oz)    Weight change:   HEENT: [place]/AT, Eyes-Brown, PERL, EOMI, Conjunctiva-Pink, Sclera-Non-icteric Neck: No JVD, No bruit, Trachea midline. Lungs:  Clear, Bilateral. Cardiac:  Regular rhythm, normal S1 and S2, no S3.  Abdomen:  Soft, non-tender. Extremities:  Trace edema present. No cyanosis. No clubbing. CNS: AxOx3, Cranial nerves grossly intact, moves all 4 extremities. Right handed. Skin: Warm and dry.   Intake/Output from previous day: 12/26 0701 - 12/27 0700 In: 720 [P.O.:720] Out: 300 [Urine:300]    Lab Results: BMET    Component Value Date/Time   NA 138 05/20/2011 0921   K 4.9 05/20/2011 0921   CL 105 05/20/2011 0921   CO2 25 05/20/2011 0921   GLUCOSE 104* 05/20/2011 0921   BUN 47* 05/20/2011 0921   CREATININE 6.14* 05/20/2011 0921   CALCIUM 9.0 05/20/2011 0921   GFRNONAA 10* 05/20/2011 0921   GFRAA 11* 05/20/2011 0921   CBC    Component Value Date/Time   WBC 4.0 05/20/2011 0921   RBC 3.74* 05/20/2011 0921   HGB 10.7* 05/20/2011 0921   HCT 32.3* 05/20/2011 0921   PLT 225 05/20/2011 0921   MCV 86.4 05/20/2011 0921   MCH 28.6 05/20/2011 0921   MCHC 33.1 05/20/2011 0921   RDW 16.4* 05/20/2011 0921   LYMPHSABS 1.3 05/19/2011 1050   MONOABS 0.4 05/19/2011 1050   EOSABS 0.3 05/19/2011 1050   BASOSABS 0.0 05/19/2011 1050   CARDIAC ENZYMES Lab Results  Component Value Date   CKTOTAL 95 05/20/2011   CKMB 3.6 05/20/2011   TROPONINI <0.30 05/20/2011    Assessment/Plan:  Patient Active Hospital Problem List: Systolic CHF, acute on chronic (05/19/2011)   Assessment: Not much volume overlaod   Plan: Continue medications. Renal failure, acute on chronic (05/19/2011)   Assessment: Stable   Plan: Renal consult, Dr. Hassell Done notified Hypertension, benign (05/19/2011)   Assessment: Needs more control.   Plan: Add Norvasc 5 mg. Twice daily. DM II (diabetes mellitus, type II), controlled (05/19/2011)   Assessment: Stable Hyperkalemia (05/19/2011)   Assessment: Improved Gout, chronic (05/19/2011)   Assessment: Stable   Plan: Medical treatment    LOS: 1 day    Dixie Dials  MD  05/20/2011, 12:32 PM

## 2011-05-20 NOTE — Progress Notes (Signed)
Pt BP was 180/110. BP meds given and Dr. Doylene Canard was notified.

## 2011-05-20 NOTE — Consult Note (Addendum)
Referring Physician: Dr. Hartley Barefoot is a 50 y.o. black man admitted yesterday by Dr. Doylene Canard because of abdominal pain and diarrhea.  He also had DOE and orthopnea.  He has been followed in our office by Dr. Mercy Moore since January 2010 with       A. Right nephrectomy 2002( renal cell carcinoma)      B. proteinuria (9G/24 HR)      C. Hypertension (at least 15 years)       D. Diabetes mellitus-2 (less than 2 years)   Results of BUN/ creatinine(and other results)  over the last several months are as follows: 03/17/2011  44/4.7     PTH 130 04/14/2011  42/5.        hgb  9.8     AVFistula placed L forearm 29 Mar 2011 05/19/2011  46/6.0 Today          47/6.1 Mr. Mayer Masker has been treated with diuretics,  his usual home meds, and feels better. However renal function has worsened and renal consult was requested by Dr.Kadakia   Past Medical History  Diagnosis Date  . CHF (congestive heart failure)   . Diabetes mellitus   . History of unilateral nephrectomy   . Hypertension   . Cardiomyopathy   . Gout   . Renal cell carcinoma   . Coronary artery disease   . Myocardial infarction ?2006  . Shortness of breath 05/19/11    "at rest, lying down, w/exertion"  . Umbilical hernia [id number]    unrepaired  . Stroke 02/2011    05/19/11 denies residual    Family History  Problem Relation Age of Onset  . Hypertension Mother   . Kidney disease Mother    mother age 36 has hypertension father age 4 is healthy he may also have hypertension. He has 2 daughters age 48 who are healthy he has 9 siblings none of whom have kidney disease Social History: Born and grew up in [place] graduated from [employer]. 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 Disabilities secondary to "congestive heart failure.", Lives with his wife they've been married for 2 years, married , has smoked cigarettes, he used to drink beer he doesn't drink beer  currently he has a remote history of using cocaine over 10 years ago  Allergies:  Allergies  Allergen Reactions  . Ace Inhibitors Cough    Medications:  Prior to Admission:    Results for orders placed during the hospital encounter of 05/19/11 (from the past 48 hour(s))  URINALYSIS, ROUTINE W REFLEX MICROSCOPIC     Status: Abnormal   Collection Time   05/19/11 10:42 AM      Component Value Range Comment   Color, Urine YELLOW  YELLOW     APPearance CLEAR  CLEAR     Specific Gravity, Urine 1.013  1.005 - 1.030     pH 6.5  5.0 - 8.0     Glucose, UA NEGATIVE  NEGATIVE (mg/dL)    Hgb urine dipstick TRACE (*) NEGATIVE     Bilirubin Urine NEGATIVE  NEGATIVE     Ketones, ur NEGATIVE  NEGATIVE (mg/dL)    Protein, ur >300 (*) NEGATIVE (mg/dL)    Urobilinogen, UA 0.2  0.0 - 1.0 (mg/dL)    Nitrite NEGATIVE  NEGATIVE     Leukocytes, UA NEGATIVE  NEGATIVE    URINE MICROSCOPIC-ADD ON     Status: Normal   Collection Time   05/19/11 10:42  AM      Component Value Range Comment   Squamous Epithelial / LPF RARE  RARE     WBC, UA 0-2  <3 (WBC/hpf)    RBC / HPF 0-2  <3 (RBC/hpf)   PRO B NATRIURETIC PEPTIDE     Status: Abnormal   Collection Time   05/19/11 10:50 AM      Component Value Range Comment   Pro B Natriuretic peptide (BNP) 1556.0 (*) 0 - 125 (pg/mL)   BASIC METABOLIC PANEL     Status: Abnormal   Collection Time   05/19/11 10:50 AM      Component Value Range Comment   Sodium 138  135 - 145 (mEq/L)    Potassium 5.5 (*) 3.5 - 5.1 (mEq/L)    Chloride 106  96 - 112 (mEq/L)    CO2 24  19 - 32 (mEq/L)    Glucose, Bld 88  70 - 99 (mg/dL)    BUN 46 (*) 6 - 23 (mg/dL)    Creatinine, Ser 6.00 (*) 0.50 - 1.35 (mg/dL)    Calcium 9.6  8.4 - 10.5 (mg/dL)    GFR calc non Af Amer 10 (*) >90 (mL/min)    GFR calc Af Amer 12 (*) >90 (mL/min)   CBC     Status: Abnormal   Collection Time   05/19/11 10:50 AM      Component Value Range Comment   WBC 4.6  4.0 - 10.5 (K/uL)    RBC 3.95 (*) 4.22  - 5.81 (MIL/uL)    Hemoglobin 11.1 (*) 13.0 - 17.0 (g/dL)    HCT 34.0 (*) 39.0 - 52.0 (%)    MCV 86.1  78.0 - 100.0 (fL)    MCH 28.1  26.0 - 34.0 (pg)    MCHC 32.6  30.0 - 36.0 (g/dL)    RDW 15.9 (*) 11.5 - 15.5 (%)    Platelets 210  150 - 400 (K/uL)   DIFFERENTIAL     Status: Abnormal   Collection Time   05/19/11 10:50 AM      Component Value Range Comment   Neutrophils Relative 57  43 - 77 (%)    Neutro Abs 2.6  1.7 - 7.7 (K/uL)    Lymphocytes Relative 28  12 - 46 (%)    Lymphs Abs 1.3  0.7 - 4.0 (K/uL)    Monocytes Relative 9  3 - 12 (%)    Monocytes Absolute 0.4  0.1 - 1.0 (K/uL)    Eosinophils Relative 6 (*) 0 - 5 (%)    Eosinophils Absolute 0.3  0.0 - 0.7 (K/uL)    Basophils Relative 0  0 - 1 (%)    Basophils Absolute 0.0  0.0 - 0.1 (K/uL)   TSH     Status: Normal   Collection Time   05/19/11  5:46 PM      Component Value Range Comment   TSH 2.034  0.350 - 4.500 (uIU/mL)   CBC     Status: Abnormal   Collection Time   05/19/11  5:46 PM      Component Value Range Comment   WBC 4.9  4.0 - 10.5 (K/uL)    RBC 3.98 (*) 4.22 - 5.81 (MIL/uL)    Hemoglobin 11.3 (*) 13.0 - 17.0 (g/dL)    HCT 34.1 (*) 39.0 - 52.0 (%)    MCV 85.7  78.0 - 100.0 (fL)    MCH 28.4  26.0 - 34.0 (pg)    MCHC 33.1  30.0 -  36.0 (g/dL)    RDW 16.0 (*) 11.5 - 15.5 (%)    Platelets 215  150 - 400 (K/uL)   CREATININE, SERUM     Status: Abnormal   Collection Time   05/19/11  5:46 PM      Component Value Range Comment   Creatinine, Ser 5.94 (*) 0.50 - 1.35 (mg/dL)    GFR calc non Af Amer 10 (*) >90 (mL/min)    GFR calc Af Amer 12 (*) >90 (mL/min)   GLUCOSE, CAPILLARY     Status: Abnormal   Collection Time   05/19/11  9:05 PM      Component Value Range Comment   Glucose-Capillary 113 (*) 70 - 99 (mg/dL)    Comment 1 Notify RN     CARDIAC PANEL(CRET KIN+CKTOT+MB+TROPI)     Status: Abnormal   Collection Time   05/20/11 12:03 AM      Component Value Range Comment   Total CK 103  7 - 232 (U/L)    CK,  MB 3.1  0.3 - 4.0 (ng/mL)    Troponin I <0.30  <0.30 (ng/mL)    Relative Index 3.0 (*) 0.0 - 2.5    GLUCOSE, CAPILLARY     Status: Abnormal   Collection Time   05/20/11  6:35 AM      Component Value Range Comment   Glucose-Capillary 100 (*) 70 - 99 (mg/dL)    Comment 1 Notify RN      Comment 2 Documented in Chart     BASIC METABOLIC PANEL     Status: Abnormal   Collection Time   05/20/11  9:21 AM      Component Value Range Comment   Sodium 138  135 - 145 (mEq/L)    Potassium 4.9  3.5 - 5.1 (mEq/L)    Chloride 105  96 - 112 (mEq/L)    CO2 25  19 - 32 (mEq/L)    Glucose, Bld 104 (*) 70 - 99 (mg/dL)    BUN 47 (*) 6 - 23 (mg/dL)    Creatinine, Ser 6.14 (*) 0.50 - 1.35 (mg/dL)    Calcium 9.0  8.4 - 10.5 (mg/dL)    GFR calc non Af Amer 10 (*) >90 (mL/min)    GFR calc Af Amer 11 (*) >90 (mL/min)   CBC     Status: Abnormal   Collection Time   05/20/11  9:21 AM      Component Value Range Comment   WBC 4.0  4.0 - 10.5 (K/uL) WHITE COUNT CONFIRMED ON SMEAR   RBC 3.74 (*) 4.22 - 5.81 (MIL/uL)    Hemoglobin 10.7 (*) 13.0 - 17.0 (g/dL)    HCT 32.3 (*) 39.0 - 52.0 (%)    MCV 86.4  78.0 - 100.0 (fL)    MCH 28.6  26.0 - 34.0 (pg)    MCHC 33.1  30.0 - 36.0 (g/dL)    RDW 16.4 (*) 11.5 - 15.5 (%)    Platelets 225  150 - 400 (K/uL)   URIC ACID     Status: Normal   Collection Time   05/20/11  9:21 AM      Component Value Range Comment   Uric Acid, Serum 6.6  4.0 - 7.8 (mg/dL)   CARDIAC PANEL(CRET KIN+CKTOT+MB+TROPI)     Status: Normal   Collection Time   05/20/11  9:21 AM      Component Value Range Comment   Total CK 95  7 - 232 (U/L)    CK, MB 3.6  0.3 - 4.0 (ng/mL)    Troponin I <0.30  <0.30 (ng/mL)    Relative Index RELATIVE INDEX IS INVALID  0.0 - 2.5    GLUCOSE, CAPILLARY     Status: Normal   Collection Time   05/20/11 11:50 AM      Component Value Range Comment   Glucose-Capillary 71  70 - 99 (mg/dL)    Comment 1 Notify RN       Ct Abdomen Pelvis Wo Contrast  05/19/2011   *RADIOLOGY REPORT*  Clinical Data: Left lower quadrant pain.  CT ABDOMEN AND PELVIS WITHOUT CONTRAST  Technique:  Multidetector CT imaging of the abdomen and pelvis was performed following the standard protocol without intravenous contrast.  Comparison: 11/05/2010 CT  Findings: Cardiomegaly.  Trace pericardial effusion.  Lung bases are clear.  Intra-abdominal organ evaluation is limited without intravenous contrast.  Within this limitation, unremarkable liver, biliary system, spleen, pancreas, adrenal glands.  Status post right nephrectomy.  The left kidney is mildly lobular contour without focal abnormality. No hydronephrosis or hydroureter.  No bowel obstruction.  No CT evidence for colitis.  Normal appendix.  Large fat containing periumbilical hernia.  No free intraperitoneal air or fluid.  No lymphadenopathy.  Limited vascular evaluation without intravenous contrast demonstrates normal caliber vessels.  Partially decompressed bladder.  L5-S1 DDD.  No acute osseous abnormality identified.  IMPRESSION:  No acute intra-abdominal abnormality identified.  Fat containing periumbilical hernia.  Status post right nephrectomy.  Cardiomegaly and trace pericardial effusion.  Original Report Authenticated By: Suanne Marker, M.D.   Dg Chest 2 View  05/19/2011  *RADIOLOGY REPORT*  Clinical Data: Shortness of breath  CHEST - 2 VIEW  Comparison: Chest x-ray of 01/18/2011  Findings: There is a moderate cardiomegaly present which is stable. There is mild pulmonary vascular congestion present.  No effusion is seen.  No acute bony abnormality is noted.  IMPRESSION: Moderate cardiomegaly.  Mild pulmonary vascular congestion.  Original Report Authenticated By: Joretta Bachelor, M.D.    ROS-he complained of DOE and orthopnea, no angina, no claudication, no melena, no hematochezia, no gross hematuria, no renal colic, no decreased force of urinary stream, no nocturia, , no purulent sputum, no hemoptysis, no weight gain or  weight loss, no cold or heat intolerance. Blood pressure 178/105, pulse 78, temperature 98 F (36.7 C), temperature source Oral, resp. rate 20, height 6\' 1"  (1.854 m), weight 133.7 kg (294 lb 12.1 oz), SpO2 99.00%.  Physical exam  he is awake alert he says his abdominal pain has gone, vital signs noted above Chest-clear Heart-no rub is heard Abdomen-scar right abdomen from prior nephrectomy,  nontender, no organs or masses are felt bowel sounds present GU-uncircumcised penis, testes descended bilaterally no masses felt Extremities-no arthritis, no rash, no edema, AV fistula patent left forearm   Assessment/Plan: Impression  1. abdominal pain, diarrhea-resolved 2. Hypertension-currently not well controlled 3. DM-2. On no meds currently 4. Proteinuria (9G) 5. CHF-better now 6. Gout (on allopurinol) 7. Chronic kidney disease stage V-worsening while in hospital Plan 1. No suggestions 2. Increase amlodipine to 10/D. 3. No suggestions 4. He has cough with ACE inhibitor. I would not recommend ARB at the current time while renal function is worsening 5. Does look like he needs further diuresis at this point 6. Continue allopurinol, check uric acid 7. Show dialysis videos, no IVs no needlesticks a left forearm, exercise AV fistula by squeezing the ball, Bmet and phosphorus in AM.  Also check PTH and Fe studies  I am hopeful renal function will not decline the point where he needs dialysis during his hospitalization. We'll follow with you  Leven Hoel F 05/20/2011, 2:40 PM

## 2011-05-21 ENCOUNTER — Encounter (HOSPITAL_COMMUNITY): Payer: Self-pay | Admitting: Cardiovascular Disease

## 2011-05-21 ENCOUNTER — Encounter (HOSPITAL_COMMUNITY)
Admission: EM | Disposition: A | Discharge status: Home / Self Care | Payer: Self-pay | Source: Ambulatory Visit | Attending: Cardiovascular Disease

## 2011-05-21 HISTORY — PX: CARDIAC CATHETERIZATION: SHX172

## 2011-05-21 HISTORY — PX: RIGHT HEART CATHETERIZATION: SHX5447

## 2011-05-21 LAB — URIC ACID: Uric Acid, Serum: 6.9 mg/dL (ref 4.0–7.8)

## 2011-05-21 LAB — GLUCOSE, CAPILLARY
Glucose-Capillary: 100 mg/dL — ABNORMAL HIGH (ref 70–99)
Glucose-Capillary: 83 mg/dL (ref 70–99)
Glucose-Capillary: 105 mg/dL — ABNORMAL HIGH (ref 70–99)
Glucose-Capillary: 78 mg/dL (ref 70–99)
Glucose-Capillary: 95 mg/dL (ref 70–99)

## 2011-05-21 LAB — CBC
HCT: 31.9 % — ABNORMAL LOW (ref 39.0–52.0)
Hemoglobin: 10.6 g/dL — ABNORMAL LOW (ref 13.0–17.0)
MCH: 28.4 pg (ref 26.0–34.0)
MCHC: 33.2 g/dL (ref 30.0–36.0)
MCV: 85.5 fL (ref 78.0–100.0)
Platelets: 210 10*3/uL (ref 150–400)
RBC: 3.73 MIL/uL — ABNORMAL LOW (ref 4.22–5.81)
RDW: 16 % — ABNORMAL HIGH (ref 11.5–15.5)
WBC: 3.5 10*3/uL — ABNORMAL LOW (ref 4.0–10.5)

## 2011-05-21 LAB — POCT I-STAT 3, VENOUS BLOOD GAS (G3P V)
Acid-base deficit: 5 mmol/L — ABNORMAL HIGH (ref 0.0–2.0)
Bicarbonate: 21.7 mEq/L (ref 20.0–24.0)
O2 Saturation: 64 %
TCO2: 23 mmol/L (ref 0–100)
pCO2, Ven: 47.3 mmHg (ref 45.0–50.0)
pH, Ven: 7.269 (ref 7.250–7.300)
pO2, Ven: 38 mmHg (ref 30.0–45.0)

## 2011-05-21 LAB — RENAL FUNCTION PANEL
Albumin: 2.6 g/dL — ABNORMAL LOW (ref 3.5–5.2)
BUN: 51 mg/dL — ABNORMAL HIGH (ref 6–23)
CO2: 21 mEq/L (ref 19–32)
Calcium: 9.1 mg/dL (ref 8.4–10.5)
Chloride: 104 mEq/L (ref 96–112)
Creatinine, Ser: 6.53 mg/dL — ABNORMAL HIGH (ref 0.50–1.35)
GFR calc Af Amer: 10 mL/min — ABNORMAL LOW (ref >90)
GFR calc non Af Amer: 9 mL/min — ABNORMAL LOW (ref >90)
Glucose, Bld: 86 mg/dL (ref 70–99)
Phosphorus: 5.5 mg/dL — ABNORMAL HIGH (ref 2.3–4.6)
Potassium: 5.2 mEq/L — ABNORMAL HIGH (ref 3.5–5.1)
Sodium: 135 mEq/L (ref 135–145)

## 2011-05-21 LAB — IRON AND TIBC
Iron: 60 ug/dL (ref 42–135)
Saturation Ratios: 25 % (ref 20–55)
TIBC: 240 ug/dL (ref 215–435)
UIBC: 180 ug/dL (ref 125–400)

## 2011-05-21 LAB — PRO B NATRIURETIC PEPTIDE: Pro B Natriuretic peptide (BNP): 1753 pg/mL — ABNORMAL HIGH (ref 0–125)

## 2011-05-21 LAB — FERRITIN: Ferritin: 462 ng/mL — ABNORMAL HIGH (ref 22–322)

## 2011-05-21 SURGERY — RIGHT HEART CATH
Anesthesia: LOCAL

## 2011-05-21 MED ORDER — MIDAZOLAM HCL 2 MG/2ML IJ SOLN
INTRAMUSCULAR | Status: AC
  Filled 2011-05-21: qty 2

## 2011-05-21 MED ORDER — FENTANYL CITRATE 0.05 MG/ML IJ SOLN
INTRAMUSCULAR | Status: AC
  Filled 2011-05-21: qty 2

## 2011-05-21 MED ORDER — FUROSEMIDE 10 MG/ML IJ SOLN
INTRAMUSCULAR | Status: AC
  Administered 2011-05-22: 40 mg via INTRAVENOUS
  Filled 2011-05-21: qty 4

## 2011-05-21 MED ORDER — LIDOCAINE HCL (PF) 1 % IJ SOLN
INTRAMUSCULAR | Status: AC
  Filled 2011-05-21: qty 30

## 2011-05-21 MED ORDER — ENOXAPARIN SODIUM 30 MG/0.3ML ~~LOC~~ SOLN
30.0000 mg | SUBCUTANEOUS | Status: DC
  Administered 2011-05-21 – 2011-05-23 (×3): 30 mg via SUBCUTANEOUS
  Filled 2011-05-21 (×4): qty 0.3

## 2011-05-21 MED ORDER — CARVEDILOL 12.5 MG PO TABS
12.5000 mg | ORAL_TABLET | Freq: Two times a day (BID) | ORAL | Status: DC
  Administered 2011-05-21 – 2011-05-24 (×7): 12.5 mg via ORAL
  Filled 2011-05-21 (×11): qty 1

## 2011-05-21 MED ORDER — HEPARIN (PORCINE) IN NACL 2-0.9 UNIT/ML-% IJ SOLN
INTRAMUSCULAR | Status: AC
  Filled 2011-05-21: qty 1000

## 2011-05-21 NOTE — Progress Notes (Signed)
Subjective: Awake,alert, had R heart cath this AM  Objective: Vital signs in last 24 hours: Blood pressure 151/101, pulse 79, temperature 97.8 F (36.6 C), temperature source Oral, resp. rate 20, height 6\' 1"  (1.854 m), weight 133.1 kg (293 lb 6.9 oz), SpO2 99.00%.   Intake/Output from previous day: 12/27 0701 - 12/28 0700 In: 963 [P.O.:960; I.V.:3] Out: 1100 [Urine:1100] Intake/Output this shift: Total I/O In: -  Out: 450 [Urine:450] Wt Readings from Last 3 Encounters:  05/21/11 133.1 kg (293 lb 6.9 oz)  05/21/11 133.1 kg (293 lb 6.9 oz)  04/30/11 132.904 kg (293 lb)     PHYSICAL EXAM General--awake, alert Chest--clear Heart--no rub Abd--nontender Extr--1+ pretib edema  Lab Results:   Lab 05/21/11 0600 05/20/11 0921 05/19/11 1746 05/19/11 1050  NA 135 138 -- 138  K 5.2* 4.9 -- 5.5*  CL 104 105 -- 106  CO2 21 25 -- 24  BUN 51* 47* -- 46*  CREATININE 6.53* 6.14* 5.94* --  EGFR -- -- -- --  GLUCOSE 86 -- -- --  CALCIUM 9.1 9.0 -- 9.6  PHOS 5.5* -- -- --      Basename 05/21/11 0600 05/20/11 0921  WBC 3.5* 4.0  HGB 10.6* 10.7*  HCT 31.9* 32.3*  PLT 210 225     Assessment/Plan: 1. abdominal pain, diarrhea-resolved  2. Hypertension-currently not well controlled  3. DM-2. On no meds currently  4. Proteinuria (9G)  5. CHF-better now  6. Gout (on allopurinol)  7. Chronic kidney disease stage V-worsening while in hospital  1. No suggestions  2. Increase amlodipine to 10/D. Dr. Doylene Canard has ordered additional lasix 3. No suggestions  4. He has cough with ACE inhibitor. I would not recommend ARB at the current time while renal function is worsening  5. Does look like he needs further diuresis at this point  6. Continue allopurinol, check uric acid  7. Show dialysis videos, no IVs no needlesticks a left forearm, exercise AV fistula by squeezing ball        Renal profile, CBC,Fe/TIBC/ferritin, PTH with AM lab   LOS: 2 days   Vergia Chea  F 05/21/2011,10:55 AM

## 2011-05-21 NOTE — Progress Notes (Signed)
Pt and wife given Renal diet recommendations per [facility]  and food list high in Potassium to avoid. Delray Alt RN

## 2011-05-21 NOTE — Progress Notes (Signed)
Order from [place] to show pt. Dialysis video. Pt declined to view video stated that he has already had outpatient teaching including some movies. Pt explained several different options for dialysis. Pt has AV fistula in his LL arm                                                                                                                               T [employer] officer, [employer]

## 2011-05-21 NOTE — Plan of Care (Signed)
Problem: Phase II Progression Outcomes Goal: Urinary output increased Outcome: Adequate for Discharge Had 40 IV lasix 12/28 am

## 2011-05-21 NOTE — Op Note (Signed)
PROCEDURE:  Right heart catheterization with cardiac output.  CLINICAL HISTORY:  This is a 50 years old black male with dilated cardiomyopathy has shortness of breath in absence of JVD or significant leg edema. He also had worsening renal function.  The risks, benefits, and details of the procedure were explained to the patient.  The patient verbalized understanding and wanted to proceed.  Informed written consent was obtained.  PROCEDURE TECHNIQUE:  The patient was approached from the right femoral vein using a 7 French short sheath.  Right heart pressures were measured using Swan-Ganz catheter including cardiac outputs and PA O2 saturation.    CONTRAST:  Total of 0 cc.  COMPLICATIONS:  None.  At the end of the procedure manual pressure was used for hemostasis.    HEMODYNAMICS:  Upper Arm pressure was 140/104; PA-54/15, PW-20, RV-51/11, RA-18. O2 sat- AO-97 %, PA-64 %. C.O.- 7.1 L/min by Fick and 7.77 by thermodilution.    IMPRESSION OF HEART CATHETERIZATION:    1. Mild to moderate elevated right heart and wedge pressures. 2. Dilated cardiomyopathy with preserved cardiac output.  RECOMMENDATION:     Resume lasix to decrease volume overload. Continue renal consult for renal dysfunction from hypertension and DM, II in patient with one kidney post surgery for renal cell cancer. Avoid inotropic medications for now.

## 2011-05-21 NOTE — Progress Notes (Signed)
UR Completed.  Hervey Ard 05/21/2011 [phone]

## 2011-05-21 NOTE — Progress Notes (Signed)
Subjective:  Some shortness of breath.  Objective:  Vital Signs in the last 24 hours: Temp:  [97.8 F (36.6 C)-98 F (36.7 C)] 97.8 F (36.6 C) (12/28 0612) Pulse Rate:  [78-82] 80  (12/28 0612) Cardiac Rhythm:  [-] Normal sinus rhythm (12/27 2000) Resp:  [20] 20  (12/28 0612) BP: (149-180)/(97-110) 151/101 mmHg (12/28 0612) SpO2:  [99 %] 99 % (12/28 0612) Weight:  [133.1 kg (293 lb 6.9 oz)] 293 lb 6.9 oz (133.1 kg) (12/28 [id number])  Physical Exam: BP Readings from Last 1 Encounters:  05/21/11 151/101    Wt Readings from Last 1 Encounters:  05/21/11 133.1 kg (293 lb 6.9 oz)    Weight change: 0.65 kg (1 lb 6.9 oz)  HEENT: [place]/AT, Eyes-Brown, PERL, EOMI, Conjunctiva-Pink, Sclera-Non-icteric Neck: No JVD, No bruit, Trachea midline. Lungs:  Clear, Bilateral. Cardiac:  Regular rhythm, normal S1 and S2, no S3.  Abdomen:  Soft, non-tender. Extremities:  Trace edema present. No cyanosis. No clubbing. CNS: AxOx3, Cranial nerves grossly intact, moves all 4 extremities. Right handed. Skin: Warm and dry.   Intake/Output from previous day: 12/27 0701 - 12/28 0700 In: 963 [P.O.:960; I.V.:3] Out: 1100 [Urine:1100]    Lab Results: BMET    Component Value Date/Time   NA 138 05/20/2011 0921   K 4.9 05/20/2011 0921   CL 105 05/20/2011 0921   CO2 25 05/20/2011 0921   GLUCOSE 104* 05/20/2011 0921   BUN 47* 05/20/2011 0921   CREATININE 6.14* 05/20/2011 0921   CALCIUM 9.0 05/20/2011 0921   GFRNONAA 10* 05/20/2011 0921   GFRAA 11* 05/20/2011 0921   CBC    Component Value Date/Time   WBC 3.5* 05/21/2011 0600   RBC 3.73* 05/21/2011 0600   HGB 10.6* 05/21/2011 0600   HCT 31.9* 05/21/2011 0600   PLT 210 05/21/2011 0600   MCV 85.5 05/21/2011 0600   MCH 28.4 05/21/2011 0600   MCHC 33.2 05/21/2011 0600   RDW 16.0* 05/21/2011 0600   LYMPHSABS 1.3 05/19/2011 1050   MONOABS 0.4 05/19/2011 1050   EOSABS 0.3 05/19/2011 1050   BASOSABS 0.0 05/19/2011 1050   CARDIAC ENZYMES Lab Results    Component Value Date   CKTOTAL 113 05/20/2011   CKMB 4.3* 05/20/2011   TROPONINI <0.30 05/20/2011    Assessment/Plan:  Patient Active Hospital Problem List: Systolic CHF, acute on chronic (05/19/2011) Assessment: Not much volume overlaod. Right heart cath for C.O. Plan: Continue medications. Renal failure, acute on chronic (05/19/2011) Assessment: Stable Plan: Renal consult, Dr. Hassell Done notified Hypertension, benign (05/19/2011) Assessment: Needs more control. Plan: Add Norvasc 5 mg. Twice daily. DM II (diabetes mellitus, type II), controlled (05/19/2011) Assessment: Stable Hyperkalemia (05/19/2011) Assessment: Improved Gout, chronic (05/19/2011) Assessment: Stable Plan: Medical treatment    LOS: 2 days    Dixie Dials  MD  05/21/2011, 7:17 AM

## 2011-05-21 NOTE — Progress Notes (Signed)
   CARE MANAGEMENT NOTE HEART FAILURE  05/21/2011   Patient:  [name]   Account Number:  [id number]    Date Initiated:  05/20/2011  Documentation initiated by:  Levada Dy  Subjective/Objective Assessment:   Patient admitted with one week history of diarrhea and abdominal pain.  Also reports SOB x 2 days, BNP mildly elevated.   Action/Plan:   Anticipate discharge to home with spouse.  Patient refused [place] at this time and feels very comfortable with discharge to home with self care and follow up with assigned  MDs.   Anticipated DC Date:    Anticipated DC Plan:    DC Planning Services:  CM consult    Choice offered to / List presented to:  C-1 Patient    [place] arranged:  [place] - 11 Patient Refused      Status of service:  In process, will continue to follow  Medicare Important Message Given:   (If response is "NO", the following Medicare IM given date fields will be blank) Date Medicare IM Given:   Date Additional Medicare IM Given:    Discharge Disposition:    Per UR Regulation:  Reviewed for med. necessity/level of care/duration of stay  Comments:   Met with patient refused [place] services at this time left information should he decide to move forward with [facility] services at discharge. 05/21/11 [place], RN, BSN  UR Concurrent Review Completed. 05/21/11 1339 Levada Dy, RN, BSN  UR Completed. 05/20/11 1440 Levada Dy, RN, BSN   Initial CM contact:  05/21/2011 12:00 M  By:  Levada Dy Initial CSW contact:     By:      Is this an INP Readmission < 30 days:  N (If "YES" please see readmission information at the bottom of note)  Patient living status prior to this admission:  FAMILY  Patient setting prior to this admission:  HOME  Comorbid conditions being treated that contributed to this admission:  HIGH CHF, DM, CM, HTN, Renal Dz  CHF Readmission Risk:  high  Type of patient education provided  HF Patient Education Assessment / Teach Back  HF Zone  Tool / Magnet  Limit salt intake  Weigh daily     Patient education provided by  [facility]    Was referral made to [facility]:  N  Is the patient's PCP the same as attending:  Y PCP:  [facility]  Readmission < 30 Days If pt has HH, did they contact the agency before going to the ED:   Name of [facility] agency:    Was the follow-up physician visit scheduled prior to discharge:    Did the patient follow-up with the physician prior to this readmission:    Was there HF Clinic visits prior to readmission:    Were there ED visits between admissions:    Readmit type:    If unscheduled and related indicate reason for readmit:

## 2011-05-22 LAB — IRON AND TIBC
Iron: 40 ug/dL — ABNORMAL LOW (ref 42–135)
Saturation Ratios: 18 % — ABNORMAL LOW (ref 20–55)
TIBC: 224 ug/dL (ref 215–435)
UIBC: 184 ug/dL (ref 125–400)

## 2011-05-22 LAB — RENAL FUNCTION PANEL
Albumin: 2.6 g/dL — ABNORMAL LOW (ref 3.5–5.2)
BUN: 62 mg/dL — ABNORMAL HIGH (ref 6–23)
CO2: 23 mEq/L (ref 19–32)
Calcium: 9 mg/dL (ref 8.4–10.5)
Chloride: 102 mEq/L (ref 96–112)
Creatinine, Ser: 6.73 mg/dL — ABNORMAL HIGH (ref 0.50–1.35)
GFR calc Af Amer: 10 mL/min — ABNORMAL LOW (ref >90)
GFR calc non Af Amer: 9 mL/min — ABNORMAL LOW (ref >90)
Glucose, Bld: 88 mg/dL (ref 70–99)
Phosphorus: 6.2 mg/dL — ABNORMAL HIGH (ref 2.3–4.6)
Potassium: 4.9 mEq/L (ref 3.5–5.1)
Sodium: 134 mEq/L — ABNORMAL LOW (ref 135–145)

## 2011-05-22 LAB — CBC
HCT: 31.3 % — ABNORMAL LOW (ref 39.0–52.0)
Hemoglobin: 10.2 g/dL — ABNORMAL LOW (ref 13.0–17.0)
MCH: 28.1 pg (ref 26.0–34.0)
MCHC: 32.6 g/dL (ref 30.0–36.0)
MCV: 86.2 fL (ref 78.0–100.0)
Platelets: 205 10*3/uL (ref 150–400)
RBC: 3.63 MIL/uL — ABNORMAL LOW (ref 4.22–5.81)
RDW: 16.1 % — ABNORMAL HIGH (ref 11.5–15.5)
WBC: 3.1 10*3/uL — ABNORMAL LOW (ref 4.0–10.5)

## 2011-05-22 LAB — GLUCOSE, CAPILLARY
Glucose-Capillary: 111 mg/dL — ABNORMAL HIGH (ref 70–99)
Glucose-Capillary: 82 mg/dL (ref 70–99)
Glucose-Capillary: 86 mg/dL (ref 70–99)
Glucose-Capillary: 107 mg/dL — ABNORMAL HIGH (ref 70–99)

## 2011-05-22 LAB — FERRITIN: Ferritin: 422 ng/mL — ABNORMAL HIGH (ref 22–322)

## 2011-05-22 MED ORDER — FUROSEMIDE 10 MG/ML IJ SOLN
40.0000 mg | Freq: Two times a day (BID) | INTRAMUSCULAR | Status: DC
  Administered 2011-05-22 – 2011-05-23 (×3): 40 mg via INTRAVENOUS
  Filled 2011-05-22 (×5): qty 4

## 2011-05-22 MED ORDER — FERUMOXYTOL INJECTION 510 MG/17 ML
510.0000 mg | Freq: Once | INTRAVENOUS | Status: AC
  Administered 2011-05-22: 510 mg via INTRAVENOUS
  Filled 2011-05-22: qty 17

## 2011-05-22 NOTE — Progress Notes (Signed)
Subjective:  Decreasing shortness of breath. Improving blood pressure control.  Objective:  Vital Signs in the last 24 hours: Temp:  [97.4 F (36.3 C)-98.5 F (36.9 C)] 97.6 F (36.4 C) (12/29 0529) Pulse Rate:  [72-88] 73  (12/29 0529) Cardiac Rhythm:  [-] Normal sinus rhythm (12/29 0840) Resp:  [20-24] 24  (12/29 0529) BP: (136-165)/(84-111) 136/84 mmHg (12/29 0529) SpO2:  [97 %-99 %] 98 % (12/29 0529) Weight:  [133.8 kg (294 lb 15.6 oz)] 294 lb 15.6 oz (133.8 kg) (12/29 0529)  Physical Exam: BP Readings from Last 1 Encounters:  05/22/11 136/84    Wt Readings from Last 1 Encounters:  05/22/11 133.8 kg (294 lb 15.6 oz)    Weight change: 0.7 kg (1 lb 8.7 oz)  HEENT: [place]/AT, Eyes-Brown, PERL, EOMI, Conjunctiva-Pink, Sclera-Non-icteric Neck: No JVD, No bruit, Trachea midline. Lungs:  Clear, Bilateral. Cardiac:  Regular rhythm, normal S1 and S2, no S3.  Abdomen:  Soft, non-tender. Extremities:  Trace edema present. No cyanosis. No clubbing. CNS: AxOx3, Cranial nerves grossly intact, moves all 4 extremities. Right handed. Skin: Warm and dry.   Intake/Output from previous day: 12/28 0701 - 12/29 0700 In: 576 [P.O.:576] Out: 1350 [Urine:1350]    Lab Results: BMET    Component Value Date/Time   NA 134* 05/22/2011 0600   K 4.9 05/22/2011 0600   CL 102 05/22/2011 0600   CO2 23 05/22/2011 0600   GLUCOSE 88 05/22/2011 0600   BUN 62* 05/22/2011 0600   CREATININE 6.73* 05/22/2011 0600   CALCIUM 9.0 05/22/2011 0600   GFRNONAA 9* 05/22/2011 0600   GFRAA 10* 05/22/2011 0600   CBC    Component Value Date/Time   WBC 3.1* 05/22/2011 0600   RBC 3.63* 05/22/2011 0600   HGB 10.2* 05/22/2011 0600   HCT 31.3* 05/22/2011 0600   PLT 205 05/22/2011 0600   MCV 86.2 05/22/2011 0600   MCH 28.1 05/22/2011 0600   MCHC 32.6 05/22/2011 0600   RDW 16.1* 05/22/2011 0600   LYMPHSABS 1.3 05/19/2011 1050   MONOABS 0.4 05/19/2011 1050   EOSABS 0.3 05/19/2011 1050   BASOSABS 0.0 05/19/2011  1050   CARDIAC ENZYMES Lab Results  Component Value Date   CKTOTAL 113 05/20/2011   CKMB 4.3* 05/20/2011   TROPONINI <0.30 05/20/2011    Assessment/Plan:  Patient Active Hospital Problem List: Systolic CHF, acute on chronic (05/19/2011)   Assessment: Improving   Plan: Continue diuresis Renal failure, acute on chronic (05/19/2011)   Assessment: Stable   Plan: per nephrology Hypertension, benign (05/19/2011)   Assessment: improving DM II (diabetes mellitus, type II), controlled (05/19/2011)   Assessment: Stable Hyperkalemia (05/19/2011)   Assessment: Improving Gout, chronic (05/19/2011)   Assessment: Stable     LOS: 3 days    Dixie Dials  MD  05/22/2011, 9:26 AM

## 2011-05-22 NOTE — Progress Notes (Signed)
Subjective: Awake, alert, giving himself sponge bath at sink.  He thinks he has appt to see Dr. Mercy Moore next week (he'll check with his wife)  Objective: Vital signs in last 24 hours: Blood pressure 127/87, pulse 62, temperature 97.3 F (36.3 C), temperature source Oral, resp. rate 20, height 6\' 1"  (1.854 m), weight 133.8 kg (294 lb 15.6 oz), SpO2 97.00%.   Intake/Output from previous day: 12/28 0701 - 12/29 0700 In: 576 [P.O.:576] Out: 1350 [Urine:1350] Intake/Output this shift: Total I/O In: 240 [P.O.:240] Out: -  Wt Readings from Last 3 Encounters:  05/22/11 133.8 kg (294 lb 15.6 oz)  05/22/11 133.8 kg (294 lb 15.6 oz)  04/30/11 132.904 kg (293 lb)   Weight hasn't really changed at all since admission despite I<O   PHYSICAL EXAM General--awake,alert Chest--clear Heart--no rub Abd--scar R abd (prior nephrectomy) Extr--no edema, AVF patent L forearm  Lab Results:   Lab 05/22/11 0600 05/21/11 0600 05/20/11 0921  NA 134* 135 138  K 4.9 5.2* 4.9  CL 102 104 105  CO2 23 21 25   BUN 62* 51* 47*  CREATININE 6.73* 6.53* 6.14*  EGFR -- -- --  GLUCOSE 88 -- --  CALCIUM 9.0 9.1 9.0  PHOS 6.2* 5.5* --      Basename 05/22/11 0600 05/21/11 0600  WBC 3.1* 3.5*  HGB 10.2* 10.6*  HCT 31.3* 31.9*  PLT 205 210    Scheduled:   . allopurinol  100 mg Oral Daily  . amLODipine  5 mg Oral BID  . aspirin  81 mg Oral Daily  . calcitRIOL  0.5 mcg Oral Daily  . carvedilol  12.5 mg Oral BID WC  . cloNIDine  0.2 mg Oral TID  . colchicine  0.6 mg Oral Daily  . enoxaparin  30 mg Subcutaneous Q24H  . ferumoxytol  510 mg Intravenous Once  . furosemide  40 mg Intravenous Q12H  . isosorbide dinitrate  20 mg Oral TID   And  . hydrALAZINE  50 mg Oral Q8H  . sodium chloride  3 mL Intravenous Q12H  . sodium chloride  3 mL Intravenous Q12H   Continuous:   Assessment/Plan: 1. abdominal pain, diarrhea-resolved  2.  Hypertension-betterl controlled with cirrent meds 3. DM-2. On  no meds currently  4.  Proteinuria (9G)--cough with ACE inhib.  No ARB now with Cr worsening  5.  CHF-better now  6.  Gout (on allopurinol)  7.  Chronic kidney disease stage V-worsening while in hospital 8.  Anemia--Fe/TIBC 18% sat, ferritin 462--will Rx IV FE--feraheme 510 mg IV x 1 today    LOS: 3 days   Stephonie Wilcoxen F 05/22/2011,1:26 PM

## 2011-05-23 LAB — BASIC METABOLIC PANEL
BUN: 67 mg/dL — ABNORMAL HIGH (ref 6–23)
CO2: 22 mEq/L (ref 19–32)
Calcium: 8.9 mg/dL (ref 8.4–10.5)
Chloride: 99 mEq/L (ref 96–112)
Creatinine, Ser: 7.03 mg/dL — ABNORMAL HIGH (ref 0.50–1.35)
GFR calc Af Amer: 10 mL/min — ABNORMAL LOW (ref >90)
GFR calc non Af Amer: 8 mL/min — ABNORMAL LOW (ref >90)
Glucose, Bld: 98 mg/dL (ref 70–99)
Potassium: 4.6 mEq/L (ref 3.5–5.1)
Sodium: 131 mEq/L — ABNORMAL LOW (ref 135–145)

## 2011-05-23 LAB — GLUCOSE, CAPILLARY
Glucose-Capillary: 104 mg/dL — ABNORMAL HIGH (ref 70–99)
Glucose-Capillary: 139 mg/dL — ABNORMAL HIGH (ref 70–99)
Glucose-Capillary: 112 mg/dL — ABNORMAL HIGH (ref 70–99)
Glucose-Capillary: 126 mg/dL — ABNORMAL HIGH (ref 70–99)

## 2011-05-23 LAB — CBC
HCT: 30.2 % — ABNORMAL LOW (ref 39.0–52.0)
Hemoglobin: 9.9 g/dL — ABNORMAL LOW (ref 13.0–17.0)
MCH: 27.7 pg (ref 26.0–34.0)
MCHC: 32.8 g/dL (ref 30.0–36.0)
MCV: 84.6 fL (ref 78.0–100.0)
Platelets: 195 10*3/uL (ref 150–400)
RBC: 3.57 MIL/uL — ABNORMAL LOW (ref 4.22–5.81)
RDW: 15.4 % (ref 11.5–15.5)
WBC: 2.9 10*3/uL — ABNORMAL LOW (ref 4.0–10.5)

## 2011-05-23 MED ORDER — FUROSEMIDE 80 MG PO TABS
80.0000 mg | ORAL_TABLET | Freq: Two times a day (BID) | ORAL | Status: DC
  Administered 2011-05-24: 80 mg via ORAL
  Filled 2011-05-23 (×4): qty 1

## 2011-05-23 MED ORDER — FUROSEMIDE 10 MG/ML IJ SOLN
80.0000 mg | Freq: Once | INTRAMUSCULAR | Status: AC
  Administered 2011-05-23: 80 mg via INTRAVENOUS
  Filled 2011-05-23: qty 8

## 2011-05-23 NOTE — Progress Notes (Signed)
Subjective:  Breathing better. Questionable extra fluids by mouth-Ice chips.  Objective:  Vital Signs in the last 24 hours: Temp:  [97.3 F (36.3 C)-97.7 F (36.5 C)] 97.7 F (36.5 C) (12/30 0644) Pulse Rate:  [68-83] 70  (12/30 0644) Cardiac Rhythm:  [-] Normal sinus rhythm (12/29 2055) Resp:  [18-20] 18  (12/30 0644) BP: (130-136)/(78-89) 134/78 mmHg (12/30 0644) SpO2:  [96 %-98 %] 98 % (12/30 0644) Weight:  [134 kg (295 lb 6.7 oz)] 295 lb 6.7 oz (134 kg) (12/30 [id number])  Physical Exam: BP Readings from Last 1 Encounters:  05/23/11 134/78    Wt Readings from Last 1 Encounters:  05/23/11 134 kg (295 lb 6.7 oz)    Weight change: 0.2 kg (7.1 oz)  HEENT: [place]/AT, Eyes-Brown, PERL, EOMI, Conjunctiva-Pink, Sclera-Non-icteric Neck: No JVD, No bruit, Trachea midline. Lungs:  Clear, Bilateral. Cardiac:  Regular rhythm, normal S1 and S2, no S3.  Abdomen:  Soft, non-tender. Extremities:  Trace edema present. No cyanosis. No clubbing. CNS: AxOx3, Cranial nerves grossly intact, moves all 4 extremities. Right handed. Skin: Warm and dry.   Intake/Output from previous day: 12/29 0701 - 12/30 0700 In: 848 [P.O.:840; IV Piggyback:8] Out: 2150 [Urine:2150]    Lab Results: BMET    Component Value Date/Time   NA 131* 05/23/2011 1020   K 4.6 05/23/2011 1020   CL 99 05/23/2011 1020   CO2 22 05/23/2011 1020   GLUCOSE 98 05/23/2011 1020   BUN 67* 05/23/2011 1020   CREATININE 7.03* 05/23/2011 1020   CALCIUM 8.9 05/23/2011 1020   GFRNONAA 8* 05/23/2011 1020   GFRAA 10* 05/23/2011 1020   CBC    Component Value Date/Time   WBC 2.9* 05/23/2011 1020   RBC 3.57* 05/23/2011 1020   HGB 9.9* 05/23/2011 1020   HCT 30.2* 05/23/2011 1020   PLT 195 05/23/2011 1020   MCV 84.6 05/23/2011 1020   MCH 27.7 05/23/2011 1020   MCHC 32.8 05/23/2011 1020   RDW 15.4 05/23/2011 1020   LYMPHSABS 1.3 05/19/2011 1050   MONOABS 0.4 05/19/2011 1050   EOSABS 0.3 05/19/2011 1050   BASOSABS 0.0 05/19/2011  1050   CARDIAC ENZYMES Lab Results  Component Value Date   CKTOTAL 113 05/20/2011   CKMB 4.3* 05/20/2011   TROPONINI <0.30 05/20/2011    Assessment/Plan:  Patient Active Hospital Problem List: Systolic CHF, acute on chronic (05/19/2011) Assessment: Improving Plan: Change to oral diuretic. Renal failure, acute on chronic (05/19/2011) Assessment: Stable Plan: per nephrology Hypertension, benign (05/19/2011) Assessment: improving DM II (diabetes mellitus, type II), controlled (05/19/2011) Assessment: Stable Hyperkalemia (05/19/2011) Assessment: Improving Gout, chronic (05/19/2011) Assessment: Stable    LOS: 4 days    Dixie Dials  MD  05/23/2011, 11:53 AM

## 2011-05-23 NOTE — Progress Notes (Signed)
Subjective: Awake, alert, watching football with brother [name]  Objective: Vital signs in last 24 hours: Blood pressure 134/78, pulse 70, temperature 97.7 F (36.5 C), temperature source Oral, resp. rate 18, height 6\' 1"  (1.854 m), weight 134 kg (295 lb 6.7 oz), SpO2 98.00%.   Intake/Output from previous day: 12/29 0701 - 12/30 0700 In: 848 [P.O.:840; IV Piggyback:8] Out: 2150 [Urine:2150] Intake/Output this shift: Total I/O In: 240 [P.O.:240] Out: 450 [Urine:450] Wt Readings from Last 3 Encounters:  05/23/11 134 kg (295 lb 6.7 oz)  05/23/11 134 kg (295 lb 6.7 oz)  04/30/11 132.904 kg (293 lb)   Weight 132.45 on day of admission (26 Dec) Weight 134     today  PHYSICAL EXAM General--awake, alert,, wants to go home Chest--clear Heart--no rub Abd--nontender Extr--trace edema, AVF patent L forearm  Lab Results:   Lab 05/23/11 1020 05/22/11 0600 05/21/11 0600  NA 131* 134* 135  K 4.6 4.9 5.2*  CL 99 102 104  CO2 22 23 21   BUN 67* 62* 51*  CREATININE 7.03* 6.73* 6.53*  EGFR -- -- --  GLUCOSE 98 -- --  CALCIUM 8.9 9.0 9.1  PHOS -- 6.2* 5.5*      Basename 05/23/11 1020 05/22/11 0600  WBC 2.9* 3.1*  HGB 9.9* 10.2*  HCT 30.2* 31.3*  PLT 195 205    Assessment/Plan:   1. abdominal pain, diarrhea-resolved  2. Hypertension-betterl controlled with current meds  3. DM-2. On no meds currently  4. Proteinuria (9G)--cough with ACE inhib. No ARB now with Cr worsening  5. CHF-better now  6. Gout (on allopurinol)  7. Chronic kidney disease stage V-worsening while in hospital Cr 7 troday 8. Anemia--Fe/TIBC 18% sat, ferritin 462--will Rx IV FE--feraheme 510 mg IV x 1  Yesterday  [name] has been seeing Dr. Mercy Moore each month.  He has appp't to see him on 31 May 2010 PO diuretics( furosemide 80 BID today).  Home soon unless wt and Cr continue to climb   LOS: 4 days   [name] F 05/23/2011,1:11 PM

## 2011-05-24 LAB — PARATHYROID HORMONE, INTACT (NO CA)
PTH: 395.2 pg/mL — ABNORMAL HIGH (ref 14.0–72.0)
PTH: 397.9 pg/mL — ABNORMAL HIGH (ref 14.0–72.0)
PTH: 431.6 pg/mL — ABNORMAL HIGH (ref 14.0–72.0)

## 2011-05-24 LAB — BASIC METABOLIC PANEL
BUN: 73 mg/dL — ABNORMAL HIGH (ref 6–23)
CO2: 24 mEq/L (ref 19–32)
Calcium: 9.3 mg/dL (ref 8.4–10.5)
Chloride: 100 mEq/L (ref 96–112)
Creatinine, Ser: 7.53 mg/dL — ABNORMAL HIGH (ref 0.50–1.35)
GFR calc Af Amer: 9 mL/min — ABNORMAL LOW (ref >90)
GFR calc non Af Amer: 8 mL/min — ABNORMAL LOW (ref >90)
Glucose, Bld: 88 mg/dL (ref 70–99)
Potassium: 4.7 mEq/L (ref 3.5–5.1)
Sodium: 135 mEq/L (ref 135–145)

## 2011-05-24 LAB — CBC
HCT: 31 % — ABNORMAL LOW (ref 39.0–52.0)
Hemoglobin: 10.3 g/dL — ABNORMAL LOW (ref 13.0–17.0)
MCH: 28.3 pg (ref 26.0–34.0)
MCHC: 33.2 g/dL (ref 30.0–36.0)
MCV: 85.2 fL (ref 78.0–100.0)
Platelets: 210 10*3/uL (ref 150–400)
RBC: 3.64 MIL/uL — ABNORMAL LOW (ref 4.22–5.81)
RDW: 15.7 % — ABNORMAL HIGH (ref 11.5–15.5)
WBC: 3.3 10*3/uL — ABNORMAL LOW (ref 4.0–10.5)

## 2011-05-24 LAB — GLUCOSE, CAPILLARY
Glucose-Capillary: 90 mg/dL (ref 70–99)
Glucose-Capillary: 93 mg/dL (ref 70–99)

## 2011-05-24 MED ORDER — CARVEDILOL 25 MG PO TABS: 12.5000 mg | ORAL_TABLET | Freq: Two times a day (BID) | ORAL | Status: DC

## 2011-05-24 NOTE — Discharge Summary (Signed)
Physician Discharge Summary  Patient ID: [name] MRN: [id number] DOB/AGE: [birth year] 50 y.o.  Admit date: 05/19/2011 Discharge date: 05/24/2011  Admission Diagnoses:  *Systolic CHF, acute on chronic Active Problems:  Renal failure, acute on chronic  Hypertension, benign  DM II (diabetes mellitus, type II), controlled  Hyperkalemia  Gout, chronic  Discharge Diagnoses:  Principal Problem:  *Systolic CHF, acute on chronic Active Problems:  Renal failure, acute on chronic, CKD, IV  Hypertension, benign  DM II (diabetes mellitus, type II), controlled  Hyperkalemia  Gout, chronic   Discharged Condition: stable  Hospital Course: 50 years old obese black male with solitary kidney post surgery presented with shortness of breath and weight gain. He has renal consult for gradual worsening of renal function from diabetes and hypertension. He was diuresed well using lasix after right heart cath showed elevated wedge and right heart pressures. His coreg dose was reduced by 50 % and he was advised to decrease allopurinol dose by 50 %.  He was reminded not to eat ice or drink extra fluids and check his weight daily to control intake and output of fluids to better manage his heart failure from dilated cardiomyopathy. He was also reminded to give up potassium rich food like banana, orange juice and potatoes.   He was discharged home in stable condition with renal follow up in 1 week and cardiology follow up in 2 weeks.  He will continue monitoring blood sugar levels at home and notify me if levels begin to rise again. He was motivated to lose some weight by decreasing food intake.   Consults: cardiology and nephrology  Significant Diagnostic Studies: labs: H/H-10.3/31.0, BUN/Cr-73/7.53 and Right heart cath showing elevated wedge and right heart pressures suggesting volume overload.  Treatments: cardiac meds: carvedilol, amlodipine and furosemide  Discharge Exam: Blood pressure 134/90,  pulse 70, temperature 98.4 F (36.9 C), temperature source Oral, resp. rate 18, height 6\' 1"  (1.854 m), weight 133.675 kg (294 lb 11.2 oz), SpO2 98.00%. HEENT: [place]/AT, Eyes-Brown, PERL, EOMI, Conjunctiva-Pink, Sclera-Non-icteric  Neck: No JVD, No bruit, Trachea midline.  Lungs: Clear, bilaterally.  Cardiac: Regular rhythm, normal S1 and S2, no S3.  Abdomen: Soft, non-tender.  Extremities: Trace edema present. No cyanosis. No clubbing.  CNS: AxOx3, Cranial nerves grossly intact, moves all 4 extremities. Right handed.  Skin: Warm and dry.  Disposition: Home or Self Care  Discharge Orders    Future Appointments: Provider: Department: Dept Phone: Center:   05/28/2011 8:00 AM [facility]  None   06/04/2011 11:00 AM [facility] [id number] [facility]   06/04/2011 11:45 AM Hinda Lenis, MD [facility] [phone] [facility]   06/11/2011 8:00 AM [facility]  None   06/25/2011 8:00 AM [facility]  None   07/09/2011 8:00 AM [facility]  None     Medication List  As of 05/24/2011 10:19 AM   CHANGE how you take these medications         carvedilol 25 MG tablet   Commonly known as: COREG   Take 0.5 tablets (12.5 mg total) by mouth 2 (two) times daily.   What changed: dose         CONTINUE taking these medications         amLODipine 10 MG tablet   Commonly known as: NORVASC      aspirin 325 MG tablet      calcitRIOL 0.5 MCG capsule  Commonly known as: ROCALTROL      cloNIDine 0.2 MG tablet   Commonly known as: CATAPRES      furosemide 80 MG tablet   Commonly known as: LASIX      hydrALAZINE 50 MG tablet   Commonly known as: APRESOLINE      isosorbide dinitrate 20 MG tablet   Commonly known as: ISORDIL      oxyCODONE 5 MG immediate release tablet   Commonly known as: Oxy IR/ROXICODONE   Take 1 tablet (5 mg total) by mouth every 4 (four) hours as  needed for pain. 1-2 PO q4-6 hours prn pain         STOP taking these medications         allopurinol 300 MG tablet          Where to get your medications       Information on where to get these meds is not yet available. Ask your nurse or doctor.         carvedilol 25 MG tablet           Follow-up Information    Follow up with HILL,GERALD K, MD in 2 weeks.      Follow up with MATTINGLY,MICHAEL T, MD in 1 week.   Contact information:   [address] [place] [phone]       Follow up with [facility] S, MD in 2 weeks.   Contact information:   [place] [phone]          Signed: Birdie Riddle 05/24/2011, 10:19 AM

## 2011-05-24 NOTE — Progress Notes (Signed)
   CARE MANAGEMENT NOTE HEART FAILURE  05/24/2011   Patient:  [name]   Account Number:  [id number]    Date Initiated:  05/20/2011  Documentation initiated by:  Levada Dy  Subjective/Objective Assessment:   Patient admitted with one week history of diarrhea and abdominal pain.  Also reports SOB x 2 days, BNP mildly elevated.   Action/Plan:   Anticipate discharge to home with spouse.  Patient refused [place] at this time and feels very comfortable with discharge to home with self care and follow up with assigned  MDs.   Anticipated DC Date:  05/24/2011  Anticipated DC Plan:  HOME/SELF CARE  DC Planning Services:  CM consult    Choice offered to / List presented to:  C-1 Patient    [place] arranged:  [place] - 11 Patient Refused      Status of service:  Completed, signed off  Medicare Important Message Given:   (If response is "NO", the following Medicare IM given date fields will be blank) Date Medicare IM Given:   Date Additional Medicare IM Given:    Discharge Disposition:  HOME/SELF CARE  Per UR Regulation:  Reviewed for med. necessity/level of care/duration of stay  Comments:   Met with patient refused [place] services at this time left information should he decide to move forward with [facility] services at discharge. 05/21/11 [place], RN, BSN  UR Concurrent Review Completed. 05/21/11 1339 Levada Dy, RN, BSN  UR Completed. 05/20/11 1440 Levada Dy, RN, BSN   Initial CM contact:  05/21/2011 12:00 M  By:  Levada Dy Initial CSW contact:     By:      Is this an INP Readmission < 30 days:  N (If "YES" please see readmission information at the bottom of note)  Patient living status prior to this admission:  FAMILY  Patient setting prior to this admission:  HOME  Comorbid conditions being treated that contributed to this admission:  HIGH CHF, DM, CM, HTN, Renal Dz  CHF Readmission Risk:  high  Type of patient education provided  HF Patient Education  Assessment / Teach Back  HF Zone Tool / Magnet  Limit salt intake  Weigh daily     Patient education provided by  [facility]    Was referral made to [facility]:  N  Is the patient's PCP the same as attending:  Y PCP:  [facility]  Readmission < 30 Days If pt has HH, did they contact the agency before going to the ED:   Name of [facility] agency:    Was the follow-up physician visit scheduled prior to discharge:    Did the patient follow-up with the physician prior to this readmission:    Was there HF Clinic visits prior to readmission:    Were there ED visits between admissions:    Readmit type:    If unscheduled and related indicate reason for readmit:

## 2011-05-24 NOTE — Progress Notes (Signed)
05/24/11 Nursing 1328pm DC IV, DC Tele, DC Home. Discharge instructions and home medications discussed with patient and patient's family. Patient denies any questions or concerns at this time. Patient leaving unit via wheelchair and appears in no acute distress. Ollen Barges M,RN

## 2011-05-28 ENCOUNTER — Encounter (HOSPITAL_COMMUNITY): Payer: BC Managed Care – PPO

## 2011-06-03 ENCOUNTER — Encounter: Payer: Self-pay | Admitting: Vascular Surgery

## 2011-06-03 DIAGNOSIS — Z85528 Personal history of other malignant neoplasm of kidney: Secondary | ICD-10-CM | POA: Insufficient documentation

## 2011-06-03 DIAGNOSIS — Z888 Allergy status to other drugs, medicaments and biological substances status: Secondary | ICD-10-CM | POA: Insufficient documentation

## 2011-06-03 DIAGNOSIS — Z905 Acquired absence of kidney: Secondary | ICD-10-CM | POA: Insufficient documentation

## 2011-06-03 DIAGNOSIS — D631 Anemia in chronic kidney disease: Secondary | ICD-10-CM | POA: Insufficient documentation

## 2011-06-03 DIAGNOSIS — N2581 Secondary hyperparathyroidism of renal origin: Secondary | ICD-10-CM | POA: Insufficient documentation

## 2011-06-03 DIAGNOSIS — F1010 Alcohol abuse, uncomplicated: Secondary | ICD-10-CM | POA: Insufficient documentation

## 2011-06-03 DIAGNOSIS — Z8673 Personal history of transient ischemic attack (TIA), and cerebral infarction without residual deficits: Secondary | ICD-10-CM | POA: Insufficient documentation

## 2011-06-03 DIAGNOSIS — I252 Old myocardial infarction: Secondary | ICD-10-CM | POA: Insufficient documentation

## 2011-06-03 DIAGNOSIS — E785 Hyperlipidemia, unspecified: Secondary | ICD-10-CM | POA: Insufficient documentation

## 2011-06-03 DIAGNOSIS — I255 Ischemic cardiomyopathy: Secondary | ICD-10-CM | POA: Insufficient documentation

## 2011-06-03 DIAGNOSIS — D509 Iron deficiency anemia, unspecified: Secondary | ICD-10-CM | POA: Insufficient documentation

## 2011-06-03 DIAGNOSIS — I120 Hypertensive chronic kidney disease with stage 5 chronic kidney disease or end stage renal disease: Secondary | ICD-10-CM | POA: Insufficient documentation

## 2011-06-03 DIAGNOSIS — I2510 Atherosclerotic heart disease of native coronary artery without angina pectoris: Secondary | ICD-10-CM | POA: Insufficient documentation

## 2011-06-04 ENCOUNTER — Encounter (HOSPITAL_COMMUNITY): Payer: Self-pay | Admitting: Pharmacy Technician

## 2011-06-04 ENCOUNTER — Encounter (HOSPITAL_COMMUNITY): Payer: Self-pay | Admitting: *Deleted

## 2011-06-04 ENCOUNTER — Encounter: Payer: Self-pay | Admitting: Vascular Surgery

## 2011-06-04 ENCOUNTER — Other Ambulatory Visit: Payer: Self-pay

## 2011-06-04 ENCOUNTER — Other Ambulatory Visit (INDEPENDENT_AMBULATORY_CARE_PROVIDER_SITE_OTHER): Payer: BC Managed Care – PPO | Admitting: *Deleted

## 2011-06-04 ENCOUNTER — Ambulatory Visit (INDEPENDENT_AMBULATORY_CARE_PROVIDER_SITE_OTHER): Payer: BC Managed Care – PPO | Admitting: Vascular Surgery

## 2011-06-04 VITALS — BP 175/115 | HR 85 | Resp 16 | Ht 73.0 in | Wt 295.0 lb

## 2011-06-04 DIAGNOSIS — Z48812 Encounter for surgical aftercare following surgery on the circulatory system: Secondary | ICD-10-CM

## 2011-06-04 DIAGNOSIS — N184 Chronic kidney disease, stage 4 (severe): Secondary | ICD-10-CM

## 2011-06-04 DIAGNOSIS — Z992 Dependence on renal dialysis: Secondary | ICD-10-CM | POA: Insufficient documentation

## 2011-06-04 DIAGNOSIS — N186 End stage renal disease: Secondary | ICD-10-CM

## 2011-06-04 NOTE — Consult Note (Signed)
Anesthesia:  Patient is a 51 year old male scheduled for a revision of a left AVF on 06/07/11.  He will be a same day work-up.  I was asked to review his history which includes CKD, DM2, HTN, CAD/MI, CVA, renal cell carcinoma s/p right nephrectomy, gout, CM/CHF.    His last echo was 05/20/11 and showed: - Left ventricle: The cavity size was severely dilated. There was moderate concentric hypertrophy. Systolic function was severely reduced. The estimated ejection fraction was in the range of 25% to 30%. Diffuse hypokinesis. - Mitral valve: Moderate regurgitation. - Left atrium: The atrium was severely dilated. - Right ventricle: The cavity size was normal. Wall thickness was mildly increased. His EF was actually improved from his last echo on 01/19/11 which was estimated at 20-25% then.  He had a right heart cath on 05/21/11 that showed: 1. Mild to moderate elevated right heart and wedge pressures.  2. Dilated cardiomyopathy with preserved cardiac output. Dr. Doylene Canard recommended resuming Lasix, renal consult, and avoid inotropic medications for now.  His Coreg was ultimately reduced by 50%.  He had a left and right HC on 10/10/08 which I found in E-chart that showed normal coronaries, moderate LV systolic dysfunction with mild elevation of LVEDP and Wedge pressures.  Medical treatment was recommended.  EKG on 05/19/11 showed SR, probable left atrial abnormality, LVH, lateral T wave abnormality (inversion in V6, I, and aVL) which has been present since at least May of 2011.  His last CXR on 05/19/11 showed moderate cardiomegaly. Mild pulmonary vascular congestion.  He is for labs on the day of surgery.  Anesthesia to evaluate him on the day of surgery and determine a definitive plan at that time.

## 2011-06-04 NOTE — Progress Notes (Signed)
[facility] [place]  Established Dialysis Access  History of Present Illness  [name] is a 51 y.o. male who presents for re-evaluation for permanent access.  The patient is right hand dominant.  Previous access procedures have been completed in the L arm: L RC AVF (03/29/11).  The patient's complication from previous access procedures include: none.  Pt has undergone HD via the L RC AVF already.  Past Medical History  Diagnosis Date  . CHF (congestive heart failure)   . Diabetes mellitus   . History of unilateral nephrectomy   . Hypertension   . Cardiomyopathy   . Gout   . Renal cell carcinoma   . Coronary artery disease   . Myocardial infarction ?2006  . Shortness of breath 05/19/11    "at rest, lying down, w/exertion"  . Umbilical hernia [id number]    unrepaired  . Stroke 02/2011    05/19/11 denies residual    Past Surgical History  Procedure Date  . Nephrectomy 2000    right  . Av fistula placement 03/29/2011    Procedure: ARTERIOVENOUS (AV) FISTULA CREATION;  Surgeon: Hinda Lenis, MD;  Location: [facility];  Service: Vascular;  Laterality: Left;  LEFT RADIOCEPHALIC , Arteriovenous [id number])  . Smashed 1990's    "left pinky; have a plate in there"    History   Social History  . Marital Status: Married    Spouse Name: N/A    Number of Children: N/A  . Years of Education: N/A   Occupational History  . Not on file.   Social History Main Topics  . Smoking status: Never Smoker   . Smokeless tobacco: Never Used  . Alcohol Use: No  . Drug Use: No  . Sexually Active: Yes   Other Topics Concern  . Not on file   Social History Narrative  . No narrative on file    Family History  Problem Relation Age of Onset  . Hypertension Mother   . Kidney disease Mother     Current Outpatient Prescriptions on File Prior to Visit  Medication Sig Dispense Refill  . amLODipine (NORVASC) 10 MG tablet Take 10 mg by mouth daily.        Marland Kitchen  aspirin 325 MG tablet Take 325 mg by mouth daily.        . calcitRIOL (ROCALTROL) 0.5 MCG capsule Take 0.5 mcg by mouth daily.        . carvedilol (COREG) 25 MG tablet Take 0.5 tablets (12.5 mg total) by mouth 2 (two) times daily.  1 tablet  0  . cloNIDine (CATAPRES) 0.2 MG tablet Take 0.2 mg by mouth 3 (three) times daily.        . furosemide (LASIX) 80 MG tablet Take 80 mg by mouth 2 (two) times daily.       . hydrALAZINE (APRESOLINE) 50 MG tablet Take 50 mg by mouth 2 (two) times daily.        . isosorbide dinitrate (ISORDIL) 20 MG tablet Take 20 mg by mouth 2 (two) times daily.        Marland Kitchen oxyCODONE (ROXICODONE) 5 MG immediate release tablet Take 1 tablet (5 mg total) by mouth every 4 (four) hours as needed for pain. 1-2 PO q4-6 hours prn pain  30 tablet  0    Allergies  Allergen Reactions  . Ace Inhibitors Cough    Review of Systems (Positive items checked otherwise negative)  General: [ ]  Weight loss, [ ]  Weight  gain, [ ]   Loss of appetite, [ ]  Fever  Neurologic: [ ]  Dizziness, [ ]  Blackouts, [ ]  Headaches, [ ]  Seizure  Ear/Nose/Throat: [ ]  Change in eyesight, [ ]  Change in hearing, [ ]  Nose bleeds, [ ]  Sore throat  Vascular: [ ]  Pain in legs with walking, [ ]  Pain in feet while lying flat, [ ]  Non-healing ulcer, Stroke, [ ]  "Mini stroke", [ ]  Slurred speech, [ ]  Temporary blindness, [ ]  Blood clot in vein, [ ]  Phlebitis  Pulmonary: [ ]  Home oxygen, [ ]  Productive cough, [ ]  Bronchitis, [ ]  Coughing up blood, [ ]  Asthma, [ ]  Wheezing  Musculoskeletal: [ ]  Arthritis, [ ]  Joint pain, [ ]  Muscle pain  Cardiac: [ ]  Chest pain, [ ]  Chest tightness/pressure, [ ]  Shortness of breath when lying flat, [x]  Shortness of breath with exertion, [ ]  Palpitations, [ ]  Heart murmur, [ ]  Arrythmia, [ ]  Atrial fibrillation  Hematologic: [ ]  Bleeding problems, [ ]  Clotting disorder, [ ]  Anemia  Psychiatric:  [ ]  Depression, [ ]  Anxiety, [ ]  Attention deficit disorder  Gastrointestinal:  [ ]  Black  stool,[ ]   Blood in stool, [ ]  Peptic ulcer disease, [ ]  Reflux, [ ]  Hiatal hernia, [ ]  Trouble swallowing, [ ]  Diarrhea, [ ]  Constipation  Urinary:  [x ] Kidney disease, [ ]  Burning with urination, [ ]  Frequent urination, [ ]  Difficulty urinating  Skin: [ ]  Ulcers, [ ]  Rashes    Physical Examination  Filed Vitals:   06/04/11 1133  BP: 175/115  Pulse: 85  Resp: 16  Height: 6\' 1"  (1.854 m)  Weight: 295 lb (133.811 kg)  SpO2: 100%   Body mass index is 38.92 kg/(m^2).  General: A&O x 3, WDWN, obese  Pulmonary: Sym exp, good air movt, CTAB, no rales, rhonchi, & wheezing  Cardiac: RRR, Nl S1, S2, no Murmurs, rubs or gallops  Gastrointestinal: soft, NTND, -G/R, - HSM, - masses, - CVAT B  Musculoskeletal: M/S 5/5 throughout , Extremities without  ischemic changes , L arm: incision healed, strong thrill at level of anastomosis, thrill attenuates along its length  Neurologic: CN 2-12 intact , Pain and light touch intact in extremities , Motor exam as listed above  Non-Invasive Vascular Imaging  L arm access duplex (Date: 06/04/11):  L arm: mobile thrombus near arterial anastomosis, PSV > 1100 near arterial anastomosis  Medical Decision Making  [name] is a 51 y.o. male who presents with ESRD requiring hemodialysis.   On exam, there is a strong thrill at the site of the thrombus, which makes me wonder if the thrombus has already migrates.  Regardless with the elevated velocities at the arterial anastomosis, I recommend revision of the arterial anastomosis and thrombectomy of the fistula while the fistula is being revised.  I had an extensive discussion with this patient in regards to the nature of access surgery, including risk, benefits, and alternatives.    The patient is aware that the risks of access surgery include but are not limited to: bleeding, infection, steal syndrome, nerve damage, ischemic monomelic neuropathy, failure of access to mature, and possible need for  additional access procedures in the future.  The patient has agreed to proceed with the above procedure which will be scheduled for this Monday 14 JAN 13 with Dr. Donnetta Hutching, as I don't have time to get the patient on the schedule.Adele Barthel, MD [facility] [place] Office: [phone] Pager: [phone]  06/04/2011,  12:52 PM

## 2011-06-06 MED ORDER — DEXTROSE 5 % IV SOLN
1.5000 g | INTRAVENOUS | Status: DC
  Filled 2011-06-06: qty 1.5

## 2011-06-06 MED ORDER — SODIUM CHLORIDE 0.9 % IV SOLN: INTRAVENOUS | Status: DC

## 2011-06-07 ENCOUNTER — Encounter (HOSPITAL_COMMUNITY): Payer: Self-pay | Admitting: Vascular Surgery

## 2011-06-07 ENCOUNTER — Encounter (HOSPITAL_COMMUNITY)
Admission: RE | Disposition: A | Discharge status: Home / Self Care | Payer: Self-pay | Source: Ambulatory Visit | Attending: Vascular Surgery

## 2011-06-07 ENCOUNTER — Ambulatory Visit (HOSPITAL_COMMUNITY)
Admission: RE | Admit: 2011-06-07 | Discharge: 2011-06-07 | Disposition: A | Discharge status: Home / Self Care | Payer: BC Managed Care – PPO | Source: Ambulatory Visit | Attending: Vascular Surgery | Admitting: Vascular Surgery

## 2011-06-07 ENCOUNTER — Ambulatory Visit (HOSPITAL_COMMUNITY): Payer: BC Managed Care – PPO

## 2011-06-07 ENCOUNTER — Encounter (HOSPITAL_COMMUNITY): Payer: Self-pay | Admitting: *Deleted

## 2011-06-07 ENCOUNTER — Ambulatory Visit (HOSPITAL_COMMUNITY): Payer: BC Managed Care – PPO | Admitting: Vascular Surgery

## 2011-06-07 DIAGNOSIS — I120 Hypertensive chronic kidney disease with stage 5 chronic kidney disease or end stage renal disease: Secondary | ICD-10-CM | POA: Insufficient documentation

## 2011-06-07 DIAGNOSIS — I2510 Atherosclerotic heart disease of native coronary artery without angina pectoris: Secondary | ICD-10-CM | POA: Insufficient documentation

## 2011-06-07 DIAGNOSIS — I509 Heart failure, unspecified: Secondary | ICD-10-CM | POA: Insufficient documentation

## 2011-06-07 DIAGNOSIS — E119 Type 2 diabetes mellitus without complications: Secondary | ICD-10-CM | POA: Insufficient documentation

## 2011-06-07 DIAGNOSIS — T82898A Other specified complication of vascular prosthetic devices, implants and grafts, initial encounter: Secondary | ICD-10-CM

## 2011-06-07 DIAGNOSIS — N186 End stage renal disease: Secondary | ICD-10-CM | POA: Insufficient documentation

## 2011-06-07 DIAGNOSIS — I252 Old myocardial infarction: Secondary | ICD-10-CM | POA: Insufficient documentation

## 2011-06-07 DIAGNOSIS — I428 Other cardiomyopathies: Secondary | ICD-10-CM | POA: Insufficient documentation

## 2011-06-07 HISTORY — DX: Dependence on renal dialysis: Z99.2

## 2011-06-07 LAB — GLUCOSE, CAPILLARY: Glucose-Capillary: 74 mg/dL (ref 70–99)

## 2011-06-07 LAB — POCT I-STAT 4, (NA,K, GLUC, HGB,HCT)
Glucose, Bld: 105 mg/dL — ABNORMAL HIGH (ref 70–99)
HCT: 31 % — ABNORMAL LOW (ref 39.0–52.0)
Hemoglobin: 10.5 g/dL — ABNORMAL LOW (ref 13.0–17.0)
Potassium: 4.3 mEq/L (ref 3.5–5.1)
Sodium: 142 mEq/L (ref 135–145)

## 2011-06-07 LAB — SURGICAL PCR SCREEN
MRSA, PCR: NEGATIVE
Staphylococcus aureus: NEGATIVE

## 2011-06-07 SURGERY — REVISON OF ARTERIOVENOUS FISTULA
Anesthesia: Monitor Anesthesia Care | Site: Arm Lower | Laterality: Left | Wound class: Clean

## 2011-06-07 MED ORDER — SODIUM CHLORIDE 0.9 % IR SOLN
Status: DC | PRN
  Administered 2011-06-07: 10:00:00

## 2011-06-07 MED ORDER — MUPIROCIN 2 % EX OINT
TOPICAL_OINTMENT | CUTANEOUS | Status: AC
  Administered 2011-06-07: 1 via NASAL
  Filled 2011-06-07: qty 22

## 2011-06-07 MED ORDER — SODIUM CHLORIDE 0.9 % IR SOLN
Status: DC | PRN
  Administered 2011-06-07: 1

## 2011-06-07 MED ORDER — ONDANSETRON HCL 4 MG/2ML IJ SOLN: 4.0000 mg | Freq: Four times a day (QID) | INTRAMUSCULAR | Status: DC | PRN

## 2011-06-07 MED ORDER — SODIUM CHLORIDE 0.9 % IV SOLN
INTRAVENOUS | Status: DC | PRN
  Administered 2011-06-07: 10:00:00 via INTRAVENOUS

## 2011-06-07 MED ORDER — HYDROMORPHONE HCL PF 1 MG/ML IJ SOLN: 0.2500 mg | INTRAMUSCULAR | Status: DC | PRN

## 2011-06-07 MED ORDER — PROPOFOL 10 MG/ML IV EMUL
INTRAVENOUS | Status: DC | PRN
  Administered 2011-06-07: 50 ug/kg/min via INTRAVENOUS

## 2011-06-07 MED ORDER — DEXTROSE 5 % IV SOLN
1.5000 g | INTRAVENOUS | Status: DC | PRN
  Administered 2011-06-07: 1.5 g via INTRAVENOUS

## 2011-06-07 MED ORDER — MIDAZOLAM HCL 5 MG/5ML IJ SOLN
INTRAMUSCULAR | Status: DC | PRN
  Administered 2011-06-07: 2 mg via INTRAVENOUS

## 2011-06-07 MED ORDER — OXYCODONE HCL 5 MG PO TABS: 5.0000 mg | ORAL_TABLET | Freq: Four times a day (QID) | ORAL | Status: AC | PRN

## 2011-06-07 MED ORDER — LIDOCAINE-EPINEPHRINE 0.5-1:200000 % IJ SOLN
INTRAMUSCULAR | Status: DC | PRN
  Administered 2011-06-07: 7 mL

## 2011-06-07 SURGICAL SUPPLY — 38 items
APL SKNCLS STERI-STRIP NONHPOA (GAUZE/BANDAGES/DRESSINGS) ×3
BENZOIN TINCTURE PRP APPL 2/3 (GAUZE/BANDAGES/DRESSINGS) ×4 IMPLANT
CANISTER SUCTION 2500CC (MISCELLANEOUS) ×2 IMPLANT
CLIP LIGATING EXTRA MED SLVR (CLIP) ×1 IMPLANT
CLIP LIGATING EXTRA SM BLUE (MISCELLANEOUS) ×1 IMPLANT
CLOTH BEACON ORANGE TIMEOUT ST (SAFETY) ×2 IMPLANT
COVER PROBE W GEL 5X96 (DRAPES) ×2 IMPLANT
COVER SURGICAL LIGHT HANDLE (MISCELLANEOUS) ×4 IMPLANT
DECANTER SPIKE VIAL GLASS SM (MISCELLANEOUS) ×2 IMPLANT
ELECT REM PT RETURN 9FT ADLT (ELECTROSURGICAL) ×2
ELECTRODE REM PT RTRN 9FT ADLT (ELECTROSURGICAL) ×1 IMPLANT
GAUZE SPONGE 4X4 12PLY STRL LF (GAUZE/BANDAGES/DRESSINGS) ×3 IMPLANT
GEL ULTRASOUND 20GR AQUASONIC (MISCELLANEOUS) IMPLANT
GLOVE BIOGEL PI IND STRL 7.0 (GLOVE) IMPLANT
GLOVE BIOGEL PI IND STRL 7.5 (GLOVE) IMPLANT
GLOVE BIOGEL PI INDICATOR 7.0 (GLOVE) ×2
GLOVE BIOGEL PI INDICATOR 7.5 (GLOVE) ×1
GLOVE ECLIPSE 6.5 STRL STRAW (GLOVE) ×1 IMPLANT
GLOVE SS BIOGEL STRL SZ 7 (GLOVE) IMPLANT
GLOVE SS BIOGEL STRL SZ 7.5 (GLOVE) ×1 IMPLANT
GLOVE SUPERSENSE BIOGEL SZ 7 (GLOVE) ×1
GLOVE SUPERSENSE BIOGEL SZ 7.5 (GLOVE) ×1
GOWN STRL NON-REIN LRG LVL3 (GOWN DISPOSABLE) ×5 IMPLANT
KIT BASIN OR (CUSTOM PROCEDURE TRAY) ×2 IMPLANT
KIT ROOM TURNOVER OR (KITS) ×2 IMPLANT
NS IRRIG 1000ML POUR BTL (IV SOLUTION) ×2 IMPLANT
PACK CV ACCESS (CUSTOM PROCEDURE TRAY) ×2 IMPLANT
PAD ARMBOARD 7.5X6 YLW CONV (MISCELLANEOUS) ×4 IMPLANT
SPONGE GAUZE 4X4 12PLY (GAUZE/BANDAGES/DRESSINGS) ×2 IMPLANT
STRIP CLOSURE SKIN 1/2X4 (GAUZE/BANDAGES/DRESSINGS) ×4 IMPLANT
SUT PROLENE 6 0 CC (SUTURE) ×2 IMPLANT
SUT VIC AB 3-0 SH 27 (SUTURE) ×2
SUT VIC AB 3-0 SH 27X BRD (SUTURE) ×1 IMPLANT
TAPE CLOTH SURG 4X10 WHT LF (GAUZE/BANDAGES/DRESSINGS) ×3 IMPLANT
TOWEL OR 17X24 6PK STRL BLUE (TOWEL DISPOSABLE) ×2 IMPLANT
TOWEL OR 17X26 10 PK STRL BLUE (TOWEL DISPOSABLE) ×2 IMPLANT
UNDERPAD 30X30 INCONTINENT (UNDERPADS AND DIAPERS) ×2 IMPLANT
WATER STERILE IRR 1000ML POUR (IV SOLUTION) ×2 IMPLANT

## 2011-06-07 NOTE — Interval H&P Note (Signed)
History and Physical Interval Note:  06/07/2011 2:29 PM  [name]  has presented today for surgery, with the diagnosis of End Stage Renal Disease  The various methods of treatment have been discussed with the patient and family. After consideration of risks, benefits and other options for treatment, the patient has consented to  Procedure(s): REVISON OF ARTERIOVENOUS FISTULA as a surgical intervention .  The patients' history has been reviewed, patient examined, no change in status, stable for surgery.  I have reviewed the patients' chart and labs.  Questions were answered to the patient's satisfaction.     Rosetta Posner

## 2011-06-07 NOTE — Progress Notes (Signed)
Dr. Donnetta Hutching at bedside, talked to pt.

## 2011-06-07 NOTE — Transfer of Care (Signed)
Immediate Anesthesia Transfer of Care Note  Patient: [name]  Procedure(s) Performed:  REVISON OF ARTERIOVENOUS FISTULA - Revision of Left Arterial Fistula  Anastomosis with Ligation of Competing Branches x two  Patient Location: PACU  Anesthesia Type: MAC  Level of Consciousness: awake, alert  and oriented  Airway & Oxygen Therapy: Patient Spontanous Breathing  Post-op Assessment: Report given to PACU RN and Post -op Vital signs reviewed and stable  Post vital signs: Reviewed and stable Filed Vitals:   06/07/11 0850  BP: 161/113  Temp: 36.7 C  Resp: 20    Complications: No apparent anesthesia complications

## 2011-06-07 NOTE — Progress Notes (Signed)
Requested office notes from dr. Doylene Canard, spoke to abbie in his office.

## 2011-06-07 NOTE — Anesthesia Preprocedure Evaluation (Addendum)
Anesthesia Evaluation  Patient identified by MRN, date of birth, ID band Patient awake    Reviewed: Allergy & Precautions, H&P , NPO status , Patient's Chart, lab work & pertinent test results, reviewed documented beta blocker date and time   Airway Mallampati: II  Neck ROM: full    Dental  (+) Missing   Pulmonary shortness of breath,          Cardiovascular hypertension, On Home Beta Blockers and On Medications + CAD, + Past MI and +CHF     Neuro/Psych CVA Negative Psych ROS   GI/Hepatic negative GI ROS, Neg liver ROS,   Endo/Other  Diabetes mellitus-Morbid obesity  Renal/GU ESRFRenal disease  Genitourinary negative   Musculoskeletal   Abdominal   Peds  Hematology negative hematology ROS (+)   Anesthesia Other Findings Bottom left tooth missing, temp crown bottom right  Reproductive/Obstetrics                          Anesthesia Physical Anesthesia Plan  ASA: IV  Anesthesia Plan: MAC   Post-op Pain Management:    Induction: Intravenous  Airway Management Planned: Simple Face Mask  Additional Equipment:   Intra-op Plan:   Post-operative Plan:   Informed Consent: I have reviewed the patients History and Physical, chart, labs and discussed the procedure including the risks, benefits and alternatives for the proposed anesthesia with the patient or authorized representative who has indicated his/her understanding and acceptance.     Plan Discussed with: CRNA and Surgeon  Anesthesia Plan Comments:         Anesthesia Quick Evaluation

## 2011-06-07 NOTE — Progress Notes (Signed)
Spoke to dr. Tama Gander concerning pt. Blood pressure.No new orders. Will sent over dr. Merrilee Jansky office notes when we receive them.

## 2011-06-07 NOTE — Op Note (Signed)
OPERATIVE REPORT  DATE OF SURGERY: 06/07/2011  PATIENT: [name], 51 y.o. male MRN: [id number]  DOB: 08-Apr-1961  PRE-OPERATIVE DIAGNOSIS: End-stage renal disease  POST-OPERATIVE DIAGNOSIS:  Same  PROCEDURE: Revision of left arm AV fistula with re\re anastomosis of cephalic vein to radial artery at the wrist and ligation of 2 competing branches  SURGEON:  Curt Jews, M.D.  PHYSICIAN ASSISTANT: Collins PA  ANESTHESIA:  Local with sedation  EBL: Minimal ml  Total I/O In: 200 [I.V.:200] Out: -   BLOOD ADMINISTERED: None  DRAINS: None  SPECIMEN: None  COUNTS CORRECT:  YES  PLAN OF CARE: PACU   PATIENT DISPOSITION:  PACU - hemodynamically stable  PROCEDURE DETAILS: The patient was taken to the operating room and placed supine position where the area of the left arm was prepped and draped in usual sterile fashion. SonoSite ultrasound was used to visualize the arterial venous anastomosis and also the cephalic vein fistula throughout the forearm. The patient had a severe stenosis at the AV anastomosis and also on further imaging had 2 large competing branches in the mid forearm. Using local anesthesia an incision was made through the prior scar at the wrist and carried down to isolate the radial artery which was of excellent size and the cephalic vein which was well-developed as a fistula. There was some kinking irregularity at this area from scarring. The radial artery was occluded proximally and distally with fistula clamps and the anastomosis was excised. There appeared to be a thickened hypertrophic valve cusp in this area was excised. The vein was of good caliber greater than 6 mm above this. The vein was reanastomosed end-to-side to the radial artery with a running 6-0 Prolene suture.  SonoSite imaging was then used to identify 2 large competing branches in the mid forearm. These were both controlled with a small incision using local anesthesia over the competing branches in  the branches were ligated with 3-0 silk ties. This gave excellent Roh flow through the fistula with good maturation present. The wounds were all irrigated with saline and hemostasis was obtained with electrocautery. The wounds were closed with 3-0 Vicryl in the subcutaneous subcuticular tissue. The stricture for applied.   Curt Jews, M.D. 06/07/2011 12:19 PM

## 2011-06-07 NOTE — H&P (View-Only) (Signed)
[facility] [place]  Established Dialysis Access  History of Present Illness  [name] is a 51 y.o. male who presents for re-evaluation for permanent access.  The patient is right hand dominant.  Previous access procedures have been completed in the L arm: L RC AVF (03/29/11).  The patient's complication from previous access procedures include: none.  Pt has undergone HD via the L RC AVF already.  Past Medical History  Diagnosis Date  . CHF (congestive heart failure)   . Diabetes mellitus   . History of unilateral nephrectomy   . Hypertension   . Cardiomyopathy   . Gout   . Renal cell carcinoma   . Coronary artery disease   . Myocardial infarction ?2006  . Shortness of breath 05/19/11    "at rest, lying down, w/exertion"  . Umbilical hernia [id number]    unrepaired  . Stroke 02/2011    05/19/11 denies residual    Past Surgical History  Procedure Date  . Nephrectomy 2000    right  . Av fistula placement 03/29/2011    Procedure: ARTERIOVENOUS (AV) FISTULA CREATION;  Surgeon: Hinda Lenis, MD;  Location: [facility];  Service: Vascular;  Laterality: Left;  LEFT RADIOCEPHALIC , Arteriovenous [id number])  . Smashed 1990's    "left pinky; have a plate in there"    History   Social History  . Marital Status: Married    Spouse Name: N/A    Number of Children: N/A  . Years of Education: N/A   Occupational History  . Not on file.   Social History Main Topics  . Smoking status: Never Smoker   . Smokeless tobacco: Never Used  . Alcohol Use: No  . Drug Use: No  . Sexually Active: Yes   Other Topics Concern  . Not on file   Social History Narrative  . No narrative on file    Family History  Problem Relation Age of Onset  . Hypertension Mother   . Kidney disease Mother     Current Outpatient Prescriptions on File Prior to Visit  Medication Sig Dispense Refill  . amLODipine (NORVASC) 10 MG tablet Take 10 mg by mouth daily.        Marland Kitchen  aspirin 325 MG tablet Take 325 mg by mouth daily.        . calcitRIOL (ROCALTROL) 0.5 MCG capsule Take 0.5 mcg by mouth daily.        . carvedilol (COREG) 25 MG tablet Take 0.5 tablets (12.5 mg total) by mouth 2 (two) times daily.  1 tablet  0  . cloNIDine (CATAPRES) 0.2 MG tablet Take 0.2 mg by mouth 3 (three) times daily.        . furosemide (LASIX) 80 MG tablet Take 80 mg by mouth 2 (two) times daily.       . hydrALAZINE (APRESOLINE) 50 MG tablet Take 50 mg by mouth 2 (two) times daily.        . isosorbide dinitrate (ISORDIL) 20 MG tablet Take 20 mg by mouth 2 (two) times daily.        Marland Kitchen oxyCODONE (ROXICODONE) 5 MG immediate release tablet Take 1 tablet (5 mg total) by mouth every 4 (four) hours as needed for pain. 1-2 PO q4-6 hours prn pain  30 tablet  0    Allergies  Allergen Reactions  . Ace Inhibitors Cough    Review of Systems (Positive items checked otherwise negative)  General: [ ]  Weight loss, [ ]  Weight  gain, [ ]   Loss of appetite, [ ]  Fever  Neurologic: [ ]  Dizziness, [ ]  Blackouts, [ ]  Headaches, [ ]  Seizure  Ear/Nose/Throat: [ ]  Change in eyesight, [ ]  Change in hearing, [ ]  Nose bleeds, [ ]  Sore throat  Vascular: [ ]  Pain in legs with walking, [ ]  Pain in feet while lying flat, [ ]  Non-healing ulcer, Stroke, [ ]  "Mini stroke", [ ]  Slurred speech, [ ]  Temporary blindness, [ ]  Blood clot in vein, [ ]  Phlebitis  Pulmonary: [ ]  Home oxygen, [ ]  Productive cough, [ ]  Bronchitis, [ ]  Coughing up blood, [ ]  Asthma, [ ]  Wheezing  Musculoskeletal: [ ]  Arthritis, [ ]  Joint pain, [ ]  Muscle pain  Cardiac: [ ]  Chest pain, [ ]  Chest tightness/pressure, [ ]  Shortness of breath when lying flat, [x]  Shortness of breath with exertion, [ ]  Palpitations, [ ]  Heart murmur, [ ]  Arrythmia, [ ]  Atrial fibrillation  Hematologic: [ ]  Bleeding problems, [ ]  Clotting disorder, [ ]  Anemia  Psychiatric:  [ ]  Depression, [ ]  Anxiety, [ ]  Attention deficit disorder  Gastrointestinal:  [ ]  Black  stool,[ ]   Blood in stool, [ ]  Peptic ulcer disease, [ ]  Reflux, [ ]  Hiatal hernia, [ ]  Trouble swallowing, [ ]  Diarrhea, [ ]  Constipation  Urinary:  [x ] Kidney disease, [ ]  Burning with urination, [ ]  Frequent urination, [ ]  Difficulty urinating  Skin: [ ]  Ulcers, [ ]  Rashes    Physical Examination  Filed Vitals:   06/04/11 1133  BP: 175/115  Pulse: 85  Resp: 16  Height: 6\' 1"  (1.854 m)  Weight: 295 lb (133.811 kg)  SpO2: 100%   Body mass index is 38.92 kg/(m^2).  General: A&O x 3, WDWN, obese  Pulmonary: Sym exp, good air movt, CTAB, no rales, rhonchi, & wheezing  Cardiac: RRR, Nl S1, S2, no Murmurs, rubs or gallops  Gastrointestinal: soft, NTND, -G/R, - HSM, - masses, - CVAT B  Musculoskeletal: M/S 5/5 throughout , Extremities without  ischemic changes , L arm: incision healed, strong thrill at level of anastomosis, thrill attenuates along its length  Neurologic: CN 2-12 intact , Pain and light touch intact in extremities , Motor exam as listed above  Non-Invasive Vascular Imaging  L arm access duplex (Date: 06/04/11):  L arm: mobile thrombus near arterial anastomosis, PSV > 1100 near arterial anastomosis  Medical Decision Making  [name] is a 51 y.o. male who presents with ESRD requiring hemodialysis.   On exam, there is a strong thrill at the site of the thrombus, which makes me wonder if the thrombus has already migrates.  Regardless with the elevated velocities at the arterial anastomosis, I recommend revision of the arterial anastomosis and thrombectomy of the fistula while the fistula is being revised.  I had an extensive discussion with this patient in regards to the nature of access surgery, including risk, benefits, and alternatives.    The patient is aware that the risks of access surgery include but are not limited to: bleeding, infection, steal syndrome, nerve damage, ischemic monomelic neuropathy, failure of access to mature, and possible need for  additional access procedures in the future.  The patient has agreed to proceed with the above procedure which will be scheduled for this Monday 14 JAN 13 with Dr. Donnetta Hutching, as I don't have time to get the patient on the schedule.Adele Barthel, MD [facility] [place] Office: [phone] Pager: [phone]  06/04/2011,  12:52 PM

## 2011-06-07 NOTE — Preoperative (Signed)
Beta Blockers   Reason not to administer Beta Blockers:Not Applicable 

## 2011-06-07 NOTE — Interval H&P Note (Signed)
History and Physical Interval Note:  06/07/2011 10:17 AM  [name]  has presented today for surgery, with the diagnosis of End Stage Renal Disease  The various methods of treatment have been discussed with the patient and family. After consideration of risks, benefits and other options for treatment, the patient has consented to  Procedure(s): REVISON OF ARTERIOVENOUS FISTULA as a surgical intervention .  The patients' history has been reviewed, patient examined, no change in status, stable for surgery.  I have reviewed the patients' chart and labs.  Questions were answered to the patient's satisfaction.     Rosetta Posner

## 2011-06-07 NOTE — Anesthesia Postprocedure Evaluation (Signed)
Anesthesia Post Note  Patient: [name]  Procedure(s) Performed:  REVISON OF ARTERIOVENOUS FISTULA - Revision of Left Arterial Fistula  Anastomosis with Ligation of Competing Branches x two  Anesthesia type: MAC  Patient location: PACU  Post pain: Pain level controlled and Adequate analgesia  Post assessment: Post-op Vital signs reviewed, Patient's Cardiovascular Status Stable and Respiratory Function Stable  Last Vitals:  Filed Vitals:   06/07/11 1244  BP: 158/98  Temp:   Resp: 16    Post vital signs: Reviewed and stable  Level of consciousness: awake, alert  and oriented  Complications: No apparent anesthesia complications

## 2011-06-09 NOTE — Procedures (Unsigned)
VASCULAR LAB EXAM  INDICATION:  Follow up of left radiocephalic AV placed, 0000000. Chronic kidney disease, stage IV.  HISTORY: Diabetes:  No. Cardiac:  CHF. Hypertension:  Yes.  EXAM:  Mobile thrombus observed at the proximal anastomosis, which appears weblike and irregular.  IMPRESSION:  Mobile thrombus with a severe increased velocity of the proximal anastomosis.  ___________________________________________ Conrad Genola, MD  SS/MEDQ  D:  06/04/2011  T:  06/05/2011  Job:  [id number]

## 2011-06-11 ENCOUNTER — Encounter (HOSPITAL_COMMUNITY): Payer: BC Managed Care – PPO

## 2011-06-25 ENCOUNTER — Encounter (HOSPITAL_COMMUNITY): Payer: BC Managed Care – PPO

## 2011-06-28 ENCOUNTER — Other Ambulatory Visit: Payer: Self-pay

## 2011-06-28 ENCOUNTER — Emergency Department (HOSPITAL_COMMUNITY): Payer: BC Managed Care – PPO

## 2011-06-28 ENCOUNTER — Emergency Department (HOSPITAL_COMMUNITY)
Admission: EM | Admit: 2011-06-28 | Discharge: 2011-06-28 | Disposition: A | Discharge status: Home / Self Care | Payer: BC Managed Care – PPO | Attending: Emergency Medicine | Admitting: Emergency Medicine

## 2011-06-28 ENCOUNTER — Encounter (HOSPITAL_COMMUNITY): Payer: Self-pay | Admitting: Neurology

## 2011-06-28 DIAGNOSIS — R0789 Other chest pain: Secondary | ICD-10-CM | POA: Insufficient documentation

## 2011-06-28 DIAGNOSIS — Z992 Dependence on renal dialysis: Secondary | ICD-10-CM | POA: Insufficient documentation

## 2011-06-28 DIAGNOSIS — I120 Hypertensive chronic kidney disease with stage 5 chronic kidney disease or end stage renal disease: Secondary | ICD-10-CM | POA: Insufficient documentation

## 2011-06-28 DIAGNOSIS — I509 Heart failure, unspecified: Secondary | ICD-10-CM | POA: Insufficient documentation

## 2011-06-28 DIAGNOSIS — I252 Old myocardial infarction: Secondary | ICD-10-CM | POA: Insufficient documentation

## 2011-06-28 DIAGNOSIS — N186 End stage renal disease: Secondary | ICD-10-CM | POA: Insufficient documentation

## 2011-06-28 DIAGNOSIS — R0602 Shortness of breath: Secondary | ICD-10-CM | POA: Insufficient documentation

## 2011-06-28 DIAGNOSIS — E119 Type 2 diabetes mellitus without complications: Secondary | ICD-10-CM | POA: Insufficient documentation

## 2011-06-28 DIAGNOSIS — R61 Generalized hyperhidrosis: Secondary | ICD-10-CM | POA: Insufficient documentation

## 2011-06-28 DIAGNOSIS — I428 Other cardiomyopathies: Secondary | ICD-10-CM | POA: Insufficient documentation

## 2011-06-28 DIAGNOSIS — R109 Unspecified abdominal pain: Secondary | ICD-10-CM | POA: Insufficient documentation

## 2011-06-28 DIAGNOSIS — R110 Nausea: Secondary | ICD-10-CM | POA: Insufficient documentation

## 2011-06-28 DIAGNOSIS — M549 Dorsalgia, unspecified: Secondary | ICD-10-CM | POA: Insufficient documentation

## 2011-06-28 LAB — CBC
HCT: 35 % — ABNORMAL LOW (ref 39.0–52.0)
Hemoglobin: 11.2 g/dL — ABNORMAL LOW (ref 13.0–17.0)
MCH: 28.4 pg (ref 26.0–34.0)
MCHC: 32 g/dL (ref 30.0–36.0)
MCV: 88.6 fL (ref 78.0–100.0)
Platelets: 212 10*3/uL (ref 150–400)
RBC: 3.95 MIL/uL — ABNORMAL LOW (ref 4.22–5.81)
RDW: 15.7 % — ABNORMAL HIGH (ref 11.5–15.5)
WBC: 6.4 10*3/uL (ref 4.0–10.5)

## 2011-06-28 LAB — BASIC METABOLIC PANEL
BUN: 26 mg/dL — ABNORMAL HIGH (ref 6–23)
CO2: 31 mEq/L (ref 19–32)
Calcium: 9.3 mg/dL (ref 8.4–10.5)
Chloride: 97 mEq/L (ref 96–112)
Creatinine, Ser: 5.54 mg/dL — ABNORMAL HIGH (ref 0.50–1.35)
GFR calc Af Amer: 13 mL/min — ABNORMAL LOW (ref >90)
GFR calc non Af Amer: 11 mL/min — ABNORMAL LOW (ref >90)
Glucose, Bld: 120 mg/dL — ABNORMAL HIGH (ref 70–99)
Potassium: 3.4 mEq/L — ABNORMAL LOW (ref 3.5–5.1)
Sodium: 137 mEq/L (ref 135–145)

## 2011-06-28 LAB — DIFFERENTIAL
Basophils Absolute: 0 10*3/uL (ref 0.0–0.1)
Basophils Relative: 0 % (ref 0–1)
Eosinophils Absolute: 0.2 10*3/uL (ref 0.0–0.7)
Eosinophils Relative: 2 % (ref 0–5)
Lymphocytes Relative: 13 % (ref 12–46)
Lymphs Abs: 0.8 10*3/uL (ref 0.7–4.0)
Monocytes Absolute: 0.5 10*3/uL (ref 0.1–1.0)
Monocytes Relative: 8 % (ref 3–12)
Neutro Abs: 4.9 10*3/uL (ref 1.7–7.7)
Neutrophils Relative %: 77 % (ref 43–77)

## 2011-06-28 LAB — POCT I-STAT TROPONIN I
Troponin i, poc: 0.04 ng/mL (ref 0.00–0.08)
Troponin i, poc: 0.04 ng/mL (ref 0.00–0.08)

## 2011-06-28 NOTE — ED Notes (Signed)
Patient transported to X-ray 

## 2011-06-28 NOTE — ED Notes (Signed)
Per ems- pt reporting sudden onset SOB and CP at 1300. Gave 1 nitro and 324 aspirin by EMS. Nitro dropped pain from 9/10 to 6/10. Pt placed on 2L oxygen reporting some relief of SOB. Pt had dialysis today. Reporting stomach cramps. Left sided CP 5/10 denying any radiation at this time. 108/72 after 1 nitro. CBG 170. HR 74 SR. 22 in R. Hand

## 2011-06-28 NOTE — ED Provider Notes (Signed)
Pt had a cardiac cath in 2010.  Normal coronary arteries at that time.  EKG does show lateral t wave changes however these changes may be associated with LVH.   Pt states he is feeling better now after NTG.  Will check for pulmonary edema, PNA.  Check cardiac enzymes and monitor.  Kathalene Frames, MD 06/29/11 [phone]

## 2011-06-28 NOTE — ED Provider Notes (Signed)
History     CSN: [id number]  Arrival date & time 06/28/11  1449   First MD Initiated Contact with Patient 06/28/11 1508      Chief Complaint  Patient presents with  . Chest Pain     HPI  History provided by the patient and family. Patient is a 51 year old African American male with past history of end-stage renal disease on dialysis Monday, Wednesday, Friday, CHF, Hypertension, CAD, CVA who presents with complaints of left-sided chest pain that began one hour ago. He states that he had just gotten up from sitting and walked a few steps when he suddenly had a tight squeezing sensation in the chest radiating around to the left flank and back. Symptoms were acute. Patient sat down on his couch and reports having associated SOB, diaphoresis and nausea. He called 911 immediately and presented here to the emergency room. Patient was treated with an aspirin and sublingual nitroglycerin in route by EMS. Patient states that his pain has improved substantially. Pain is reported to feel different than previous MI. Patient currently denies any other symptoms. Report any other aggravating or alleviating factors. Patient does report having a normal dialysis earlier this morning and finishing around noon. He does report having cramps in his muscles earlier due to the dialysis which is typical for him.      Past Medical History  Diagnosis Date  . CHF (congestive heart failure)   . History of unilateral nephrectomy   . Hypertension   . Cardiomyopathy   . Gout   . Renal cell carcinoma   . Coronary artery disease   . Myocardial infarction ?2006  . Shortness of breath 05/19/11    "at rest, lying down, w/exertion"  . Umbilical hernia [id number]    unrepaired  . Stroke 02/2011    05/19/11 denies residual  . Dialysis patient     Mon-Wed-Fri  . Diabetes mellitus     pt. states he's borderline diabetic.    Past Surgical History  Procedure Date  . Nephrectomy 2000    right  . Av fistula placement  03/29/2011    Procedure: ARTERIOVENOUS (AV) FISTULA CREATION;  Surgeon: Hinda Lenis, MD;  Location: [facility];  Service: Vascular;  Laterality: Left;  LEFT RADIOCEPHALIC , Arteriovenous [id number])  . Smashed 1990's    "left pinky; have a plate in there"  . US echocardiography 05/20/11  . Cardiac catheterization 05/21/11    Family History  Problem Relation Age of Onset  . Hypertension Mother   . Kidney disease Mother     History  Substance Use Topics  . Smoking status: Never Smoker   . Smokeless tobacco: Never Used  . Alcohol Use: No      Review of Systems  Constitutional: Positive for diaphoresis. Negative for fever and chills.  Respiratory: Positive for shortness of breath. Negative for cough.   Cardiovascular: Positive for chest pain. Negative for palpitations.  Gastrointestinal: Positive for nausea. Negative for vomiting, diarrhea and constipation.  All other systems reviewed and are negative.    Allergies  Ace inhibitors  Home Medications   Current Outpatient Rx  Name Route Sig Dispense Refill  . AMLODIPINE BESYLATE 10 MG PO TABS Oral Take 10 mg by mouth daily.     . ASPIRIN 325 MG PO TABS Oral Take 325 mg by mouth daily.     Marland Kitchen NEPHRO-VITE 0.8 MG PO TABS Oral Take 0.8 mg by mouth at bedtime.    Marland Kitchen CALCIUM ACETATE 667 MG PO CAPS Oral  Take 1,334 mg by mouth 3 (three) times daily with meals.    Marland Kitchen CARVEDILOL 25 MG PO TABS Oral Take 0.5 tablets (12.5 mg total) by mouth 2 (two) times daily. 1 tablet 0  . CLONIDINE HCL 0.2 MG PO TABS Oral Take 0.2 mg by mouth 3 (three) times daily.     Marland Kitchen HYDRALAZINE HCL 50 MG PO TABS Oral Take 50 mg by mouth 2 (two) times daily.     . ISOSORBIDE DINITRATE 20 MG PO TABS Oral Take 20 mg by mouth 2 (two) times daily.     . OXYCODONE HCL 5 MG PO TABS Oral Take 5 mg by mouth every 4 (four) hours as needed. gout pain      BP 115/66  Pulse 79  Temp(Src) 98.3 F (36.8 C) (Oral)  Resp 16  SpO2 100%  Physical Exam  Nursing note and  vitals reviewed. Constitutional: He is oriented to person, place, and time. He appears well-developed and well-nourished. No distress.  HENT:  Head: Normocephalic and atraumatic.  Cardiovascular: Normal rate and regular rhythm.        AV fistula in left forearm with good bruit and thrill.  Pulmonary/Chest: Effort normal and breath sounds normal.  Abdominal: Soft. He exhibits no distension. There is no tenderness. There is no rebound.  Musculoskeletal: He exhibits no tenderness.       Mild swelling in bilateral lower extremities without pitting.  Neurological: He is alert and oriented to person, place, and time.  Skin: Skin is warm and dry. He is not diaphoretic.  Psychiatric: He has a normal mood and affect. His behavior is normal.    ED Course  Procedures   Labs Reviewed  CBC - Abnormal; Notable for the following:    RBC 3.95 (*)    Hemoglobin 11.2 (*)    HCT 35.0 (*)    RDW 15.7 (*)    All other components within normal limits  BASIC METABOLIC PANEL - Abnormal; Notable for the following:    Potassium 3.4 (*)    Glucose, Bld 120 (*)    BUN 26 (*)    Creatinine, Ser 5.54 (*)    GFR calc non Af Amer 11 (*)    GFR calc Af Amer 13 (*)    All other components within normal limits  DIFFERENTIAL  POCT I-STAT TROPONIN I  POCT I-STAT TROPONIN I   Results for orders placed during the hospital encounter of 06/28/11  CBC      Component Value Range   WBC 6.4  4.0 - 10.5 (K/uL)   RBC 3.95 (*) 4.22 - 5.81 (MIL/uL)   Hemoglobin 11.2 (*) 13.0 - 17.0 (g/dL)   HCT 35.0 (*) 39.0 - 52.0 (%)   MCV 88.6  78.0 - 100.0 (fL)   MCH 28.4  26.0 - 34.0 (pg)   MCHC 32.0  30.0 - 36.0 (g/dL)   RDW 15.7 (*) 11.5 - 15.5 (%)   Platelets 212  150 - 400 (K/uL)  DIFFERENTIAL      Component Value Range   Neutrophils Relative 77  43 - 77 (%)   Neutro Abs 4.9  1.7 - 7.7 (K/uL)   Lymphocytes Relative 13  12 - 46 (%)   Lymphs Abs 0.8  0.7 - 4.0 (K/uL)   Monocytes Relative 8  3 - 12 (%)   Monocytes  Absolute 0.5  0.1 - 1.0 (K/uL)   Eosinophils Relative 2  0 - 5 (%)   Eosinophils Absolute 0.2  0.0 -  0.7 (K/uL)   Basophils Relative 0  0 - 1 (%)   Basophils Absolute 0.0  0.0 - 0.1 (K/uL)  BASIC METABOLIC PANEL      Component Value Range   Sodium 137  135 - 145 (mEq/L)   Potassium 3.4 (*) 3.5 - 5.1 (mEq/L)   Chloride 97  96 - 112 (mEq/L)   CO2 31  19 - 32 (mEq/L)   Glucose, Bld 120 (*) 70 - 99 (mg/dL)   BUN 26 (*) 6 - 23 (mg/dL)   Creatinine, Ser 5.54 (*) 0.50 - 1.35 (mg/dL)   Calcium 9.3  8.4 - 10.5 (mg/dL)   GFR calc non Af Amer 11 (*) >90 (mL/min)   GFR calc Af Amer 13 (*) >90 (mL/min)  POCT I-STAT TROPONIN I      Component Value Range   Troponin i, poc 0.04  0.00 - 0.08 (ng/mL)   Comment 3           POCT I-STAT TROPONIN I      Component Value Range   Troponin i, poc 0.04  0.00 - 0.08 (ng/mL)   Comment 3               Dg Chest 2 View  06/28/2011  *RADIOLOGY REPORT*  Clinical Data: Chest pain and shortness of breath.  CHEST - 2 VIEW  Comparison: Chest x-ray 06/07/2011.  Findings: The heart is enlarged but stable.  Mild vascular congestion without overt pulmonary edema.  No pleural effusions or focal infiltrates.  The bony thorax is intact.  IMPRESSION: Cardiac enlargement and mild vascular congestion.  Original Report Authenticated By: P. Kalman Jewels, M.D.     1. Chest pain       MDM  3:15 PM patient seen and evaluated. Patient in no acute distress. Patient currently rates pain 0/10. Patient states he feels much better.   Past records reviewed.  Patient follows with Dr. Doylene Canard with cards.  She had normal heart cath 2010. Patient had a right-sided heart cath performed last December.  Pt seen and discussed with Attending Physician.  He agrees with workup and current treatment plan. Will order basic labs and cardiac markers.   6:00 PM The patient continues to be pain-free. He has normal respirations and good O2 saturations without complaints of shortness of breath.  His lab work has been unremarkable. He has negative troponins x2. With patient's history of normal coronary arteries with cath in 2010 is felt patient low risk for ACS at this time. Plan to have patient return home with close followup with Dr. Doylene Canard.     Date: 06/28/2011  Rate: 78  Rhythm: normal sinus rhythm  QRS Axis: normal  Intervals: normal  ST/T Wave abnormalities: nonspecific ST/T changes  Conduction Disutrbances:nonspecific intraventricular conduction delay  Narrative Interpretation: LVH with IVCD and secondary repol abnormality  Old EKG Reviewed: Slight T wave changes from 05/19/2011 in leads V4 and V5     Martie Lee, [place] 06/28/11 2019

## 2011-07-06 ENCOUNTER — Encounter: Payer: Self-pay | Admitting: Physician Assistant

## 2011-07-07 ENCOUNTER — Ambulatory Visit: Payer: BC Managed Care – PPO | Admitting: Thoracic Diseases

## 2011-07-08 ENCOUNTER — Ambulatory Visit: Payer: BC Managed Care – PPO

## 2011-07-09 ENCOUNTER — Encounter (HOSPITAL_COMMUNITY): Payer: BC Managed Care – PPO

## 2011-07-09 ENCOUNTER — Ambulatory Visit: Payer: BC Managed Care – PPO

## 2011-07-14 ENCOUNTER — Encounter: Payer: Self-pay | Admitting: Thoracic Diseases

## 2011-07-15 ENCOUNTER — Ambulatory Visit: Payer: BC Managed Care – PPO

## 2011-07-16 ENCOUNTER — Ambulatory Visit (INDEPENDENT_AMBULATORY_CARE_PROVIDER_SITE_OTHER): Payer: BC Managed Care – PPO | Admitting: Thoracic Diseases

## 2011-07-16 ENCOUNTER — Encounter: Payer: Self-pay | Admitting: Thoracic Diseases

## 2011-07-16 VITALS — BP 154/85 | HR 91 | Resp 16 | Ht 73.0 in | Wt 282.0 lb

## 2011-07-16 DIAGNOSIS — N186 End stage renal disease: Secondary | ICD-10-CM

## 2011-07-16 NOTE — Progress Notes (Signed)
[facility]  Postoperative Visit hemodialysis access   Date of Surgery: Revision of AVF with ligation of competing branches1/14/13 Surgeon: TFE  HPI: [name] is a 51 y.o. male who is 4 weeks S/P /revision of left upper extremity Hemodialysis access. The patient denies symptoms of numbness, tingling, weakness and denies pain in the operative limb. Patient is here for post -op evaluation to assess healing and maturation of left radiocephalic AVF . Good flows at HD  Physical Examination  WDWN male in NAD.  left upper extremity Incision is healed Skin color is normal   Hand grip is 5/5 and sensation in digits is intact; There is a good thrill and good bruit in the AVF. The graft/fistula is easily palpable and of adequate size  Assessment/Plan [name] is a 51 y.o. year old who is s/p /revision of left upper extremity Hemodialysis access - functioning well at HD Follow-up as needed  Clinic MD: CS Scot Dock, MD

## 2011-08-03 ENCOUNTER — Ambulatory Visit: Payer: BC Managed Care – PPO | Admitting: Cardiology

## 2011-08-04 ENCOUNTER — Encounter: Payer: Self-pay | Admitting: Cardiology

## 2011-08-11 ENCOUNTER — Encounter: Payer: Self-pay | Admitting: Cardiology

## 2011-08-12 ENCOUNTER — Other Ambulatory Visit (HOSPITAL_COMMUNITY): Payer: Self-pay | Admitting: Cardiovascular Disease

## 2011-08-12 DIAGNOSIS — I2510 Atherosclerotic heart disease of native coronary artery without angina pectoris: Secondary | ICD-10-CM

## 2011-08-18 ENCOUNTER — Encounter: Payer: Self-pay | Admitting: Cardiology

## 2011-08-19 ENCOUNTER — Ambulatory Visit (HOSPITAL_COMMUNITY)
Admission: RE | Admit: 2011-08-19 | Discharge: 2011-08-19 | Disposition: A | Discharge status: Home / Self Care | Payer: BC Managed Care – PPO | Source: Ambulatory Visit | Attending: Cardiovascular Disease | Admitting: Cardiovascular Disease

## 2011-08-19 ENCOUNTER — Encounter (HOSPITAL_COMMUNITY)
Admission: RE | Admit: 2011-08-19 | Discharge: 2011-08-19 | Disposition: A | Discharge status: Home / Self Care | Payer: BC Managed Care – PPO | Source: Ambulatory Visit | Attending: Cardiovascular Disease | Admitting: Cardiovascular Disease

## 2011-08-19 DIAGNOSIS — I2510 Atherosclerotic heart disease of native coronary artery without angina pectoris: Secondary | ICD-10-CM

## 2011-08-19 DIAGNOSIS — I4949 Other premature depolarization: Secondary | ICD-10-CM | POA: Insufficient documentation

## 2011-08-19 DIAGNOSIS — R079 Chest pain, unspecified: Secondary | ICD-10-CM | POA: Insufficient documentation

## 2011-08-19 MED ORDER — TECHNETIUM TC 99M TETROFOSMIN IV KIT
30.0000 | PACK | Freq: Once | INTRAVENOUS | Status: AC | PRN
  Administered 2011-08-19: 30 via INTRAVENOUS

## 2011-08-19 MED ORDER — TECHNETIUM TC 99M TETROFOSMIN IV KIT
10.0000 | PACK | Freq: Once | INTRAVENOUS | Status: AC | PRN
  Administered 2011-08-19: 10 via INTRAVENOUS

## 2011-08-19 MED ORDER — REGADENOSON 0.4 MG/5ML IV SOLN
0.4000 mg | Freq: Once | INTRAVENOUS | Status: AC
  Administered 2011-08-19: 0.4 mg via INTRAVENOUS

## 2011-08-19 MED ORDER — REGADENOSON 0.4 MG/5ML IV SOLN
INTRAVENOUS | Status: AC
  Filled 2011-08-19: qty 5

## 2011-08-25 ENCOUNTER — Encounter: Payer: Self-pay | Admitting: Cardiology

## 2011-09-01 ENCOUNTER — Encounter: Payer: Self-pay | Admitting: Cardiology

## 2011-09-08 ENCOUNTER — Encounter: Payer: Self-pay | Admitting: Cardiology

## 2011-09-16 ENCOUNTER — Encounter (INDEPENDENT_AMBULATORY_CARE_PROVIDER_SITE_OTHER): Payer: BC Managed Care – PPO | Admitting: Cardiology

## 2011-09-16 ENCOUNTER — Encounter: Payer: Self-pay | Admitting: Cardiology

## 2011-09-16 VITALS — BP 168/100 | HR 73 | Ht 72.0 in | Wt 276.0 lb

## 2011-09-16 DIAGNOSIS — I5023 Acute on chronic systolic (congestive) heart failure: Secondary | ICD-10-CM

## 2011-09-16 NOTE — Progress Notes (Deleted)
HPI  Allergies  Allergen Reactions  . Ace Inhibitors Cough    Current Outpatient Prescriptions  Medication Sig Dispense Refill  . amLODipine (NORVASC) 10 MG tablet Take 10 mg by mouth daily.       Marland Kitchen aspirin 325 MG tablet Take 325 mg by mouth daily.       Marland Kitchen b complex-vitamin c-folic acid (NEPHRO-VITE) 0.8 MG TABS Take 0.8 mg by mouth at bedtime.      . calcium acetate (PHOSLO) 667 MG capsule Take 1,334 mg by mouth 3 (three) times daily with meals.      . carvedilol (COREG) 25 MG tablet Take 0.5 tablets (12.5 mg total) by mouth 2 (two) times daily.  1 tablet  0  . cloNIDine (CATAPRES) 0.2 MG tablet Take 0.2 mg by mouth 3 (three) times daily.       . hydrALAZINE (APRESOLINE) 50 MG tablet Take 50 mg by mouth 2 (two) times daily.       . isosorbide dinitrate (ISORDIL) 20 MG tablet Take 20 mg by mouth 2 (two) times daily.       Marland Kitchen oxyCODONE (OXY IR/ROXICODONE) 5 MG immediate release tablet Take 5 mg by mouth every 4 (four) hours as needed. gout pain        Past Medical History  Diagnosis Date  . CHF (congestive heart failure)   . History of unilateral nephrectomy   . Hypertension   . Cardiomyopathy   . Gout   . Renal cell carcinoma   . Coronary artery disease   . Myocardial infarction ?2006  . Shortness of breath 05/19/11    "at rest, lying down, w/exertion"  . Umbilical hernia [id number]    unrepaired  . Stroke 02/2011    05/19/11 denies residual  . Dialysis patient     Mon-Wed-Fri  . Diabetes mellitus     pt. states he's borderline diabetic.    Past Surgical History  Procedure Date  . Nephrectomy 2000    right  . Av fistula placement 03/29/2011    Procedure: ARTERIOVENOUS (AV) FISTULA CREATION;  Surgeon: Hinda Lenis, MD;  Location: [facility];  Service: Vascular;  Laterality: Left;  LEFT RADIOCEPHALIC , Arteriovenous [id number])  . Smashed 1990's    "left pinky; have a plate in there"  . US echocardiography 05/20/11  . Cardiac catheterization 05/21/11    Family  History  Problem Relation Age of Onset  . Hypertension Mother   . Kidney disease Mother     History   Social History  . Marital Status: Married    Spouse Name: N/A    Number of Children: N/A  . Years of Education: N/A   Occupational History  . Not on file.   Social History Main Topics  . Smoking status: Never Smoker   . Smokeless tobacco: Never Used  . Alcohol Use: No  . Drug Use: No  . Sexually Active: Yes   Other Topics Concern  . Not on file   Social History Narrative  . No narrative on file    ROS:  Positive for blindness in his right eye, cramps. Otherwise as stated in the history of present illness and negative for all other systems.  PHYSICAL EXAM BP 168/100  Pulse 73  Ht 6' (1.829 m)  Wt 276 lb (125.193 kg)  BMI 37.43 kg/m2 GENERAL:  Well appearing HEENT:  Pupils equal round and reactive, fundi not visualized, oral mucosa unremarkable NECK:  No jugular venous distention, waveform within normal limits, carotid upstroke  brisk and symmetric, no bruits, no thyromegaly LYMPHATICS:  No cervical, inguinal adenopathy LUNGS:  Clear to auscultation bilaterally BACK:  No CVA tenderness CHEST:  Unremarkable HEART:  PMI not displaced or sustained,S1 and S2 within normal limits, no S3, no S4, no clicks, no rubs, no murmurs ABD:  Flat, positive bowel sounds normal in frequency in pitch, no bruits, no rebound, no guarding, no midline pulsatile mass, no hepatomegaly, no splenomegaly EXT:  2 plus pulses throughout, no edema, no cyanosis no clubbing SKIN:  No rashes no nodules NEURO:  Cranial nerves II through XII grossly intact, motor grossly intact throughout PSYCH:  Cognitively intact, oriented to person place and time  EKG:  Sinus rhythm, rate 73, axis within normal limits, intervals within normal limits,  ASSESSMENT AND PLAN

## 2011-09-16 NOTE — Progress Notes (Signed)
Error

## 2011-10-15 ENCOUNTER — Encounter: Payer: Self-pay | Admitting: Vascular Surgery

## 2011-10-19 ENCOUNTER — Encounter: Payer: Self-pay | Admitting: Vascular Surgery

## 2011-10-19 ENCOUNTER — Ambulatory Visit (INDEPENDENT_AMBULATORY_CARE_PROVIDER_SITE_OTHER): Payer: BC Managed Care – PPO | Admitting: Vascular Surgery

## 2011-10-19 VITALS — BP 128/79 | HR 86 | Resp 20 | Ht 73.0 in | Wt 275.0 lb

## 2011-10-19 DIAGNOSIS — N186 End stage renal disease: Secondary | ICD-10-CM

## 2011-10-19 NOTE — Progress Notes (Signed)
The patient has today for concern regarding pain at the site of the left AV fistula. This has been used for several months. Most recently he had ligation of several competing branches in January of 2013. He had an episode of a large infiltration and had pain associated with this. This is resolving but he continues to have a great deal of resolving hematoma and blood on the skin on the ulnar aspect of his forearm. He does report some mild steal symptoms with further questioning. He does report that he gets somewhat tired an achy sensation in his left hand in this can be more frequent with hemodialysis. He reports he can tolerate his level of discomfort. He reports that he is having good flows at hemodialysis.  Past Medical History  Diagnosis Date  . CHF (congestive heart failure)   . History of unilateral nephrectomy   . Hypertension   . Cardiomyopathy   . Gout   . Renal cell carcinoma   . Coronary artery disease   . Myocardial infarction ?2006  . Shortness of breath 05/19/11    "at rest, lying down, w/exertion"  . Umbilical hernia [id number]    unrepaired  . Stroke 02/2011    05/19/11 denies residual  . Dialysis patient     Mon-Wed-Fri  . Diabetes mellitus     pt. states he's borderline diabetic.    History  Substance Use Topics  . Smoking status: Never Smoker   . Smokeless tobacco: Never Used  . Alcohol Use: No    Family History  Problem Relation Age of Onset  . Hypertension Mother   . Kidney disease Mother     Allergies  Allergen Reactions  . Ace Inhibitors Cough    Current outpatient prescriptions:amLODipine (NORVASC) 10 MG tablet, Take 10 mg by mouth daily. , Disp: , Rfl: ;  aspirin 325 MG tablet, Take 325 mg by mouth daily. , Disp: , Rfl: ;  b complex-vitamin c-folic acid (NEPHRO-VITE) 0.8 MG TABS, Take 0.8 mg by mouth at bedtime., Disp: , Rfl: ;  calcium acetate (PHOSLO) 667 MG capsule, Take 1,334 mg by mouth 3 (three) times daily with meals., Disp: , Rfl:  carvedilol  (COREG) 25 MG tablet, Take 0.5 tablets (12.5 mg total) by mouth 2 (two) times daily., Disp: 1 tablet, Rfl: 0;  cloNIDine (CATAPRES) 0.2 MG tablet, Take 0.2 mg by mouth 3 (three) times daily. , Disp: , Rfl: ;  hydrALAZINE (APRESOLINE) 50 MG tablet, Take 50 mg by mouth 2 (two) times daily. , Disp: , Rfl: ;  isosorbide dinitrate (ISORDIL) 20 MG tablet, Take 20 mg by mouth 2 (two) times daily. , Disp: , Rfl:  oxyCODONE (OXY IR/ROXICODONE) 5 MG immediate release tablet, Take 5 mg by mouth every 4 (four) hours as needed. gout pain, Disp: , Rfl:   BP 128/79  Pulse 86  Resp 20  Ht 6\' 1"  (1.854 m)  Wt 275 lb (124.739 kg)  BMI 36.28 kg/m2  Body mass index is 36.28 kg/(m^2).       His exam well-nourished black male in no acute distress. He does have an excellent forearm cephalic vein AV fistula with excellent thrill. He does have resolving hematoma and the ulnar aspect of his forearm. He does have well-perfused hand distal to this.  Impression and plan pain secondary to infiltration at hemodialysis. He also is reporting some mild steal symptoms. I explained the significance of this 2 with him and he reports that he is able to tolerate his level of  discomfort. He will see [place] on an as-needed basis

## 2011-12-08 DIAGNOSIS — Z6838 Body mass index (BMI) 38.0-38.9, adult: Secondary | ICD-10-CM | POA: Insufficient documentation

## 2011-12-16 ENCOUNTER — Ambulatory Visit (INDEPENDENT_AMBULATORY_CARE_PROVIDER_SITE_OTHER): Payer: BC Managed Care – PPO | Admitting: General Surgery

## 2011-12-23 ENCOUNTER — Ambulatory Visit (INDEPENDENT_AMBULATORY_CARE_PROVIDER_SITE_OTHER): Payer: BC Managed Care – PPO | Admitting: General Surgery

## 2011-12-23 ENCOUNTER — Encounter (INDEPENDENT_AMBULATORY_CARE_PROVIDER_SITE_OTHER): Payer: Self-pay | Admitting: General Surgery

## 2011-12-23 ENCOUNTER — Encounter (INDEPENDENT_AMBULATORY_CARE_PROVIDER_SITE_OTHER): Payer: Self-pay

## 2011-12-23 VITALS — BP 158/98 | HR 82 | Temp 97.1°F | Resp 18 | Ht 72.0 in | Wt 276.6 lb

## 2011-12-23 DIAGNOSIS — K429 Umbilical hernia without obstruction or gangrene: Secondary | ICD-10-CM

## 2011-12-23 NOTE — Progress Notes (Signed)
Patient ID: [name], male   DOB: 1960/08/19, 51 y.o.   MRN: [id number]  No chief complaint on file.   HPI [name] is a 51 y.o. male.  HPI This patient presents for evaluation of an umbilical hernia.  He says that this has been present for 2 years and has been increasing in size. It doesn't cause any particular discomfort and he denies any obstructive symptoms such as changes in his bowels or nausea or vomiting. He does state that it will occasionally drain which he thinks is due to sweating. He has several medical problems including heart failure and history of myocardial infarction and a history of stroke as well as renal failure. He is currently dialyzed on Mondays Wednesdays and Fridays and is followed by Dr. Doylene Canard for his heart.  Past Medical History  Diagnosis Date  . CHF (congestive heart failure)   . History of unilateral nephrectomy   . Hypertension   . Cardiomyopathy   . Gout   . Renal cell carcinoma   . Coronary artery disease   . Myocardial infarction ?2006  . Shortness of breath 05/19/11    "at rest, lying down, w/exertion"  . Umbilical hernia [id number]    unrepaired  . Stroke 02/2011    05/19/11 denies residual  . Dialysis patient     Mon-Wed-Fri  . Diabetes mellitus     pt. states he's borderline diabetic.    Past Surgical History  Procedure Date  . Nephrectomy 2000    right  . Av fistula placement 03/29/2011    Procedure: ARTERIOVENOUS (AV) FISTULA CREATION;  Surgeon: Hinda Lenis, MD;  Location: [facility];  Service: Vascular;  Laterality: Left;  LEFT RADIOCEPHALIC , Arteriovenous [id number])  . Smashed 1990's    "left pinky; have a plate in there"  . US echocardiography 05/20/11  . Cardiac catheterization 05/21/11    Family History  Problem Relation Age of Onset  . Hypertension Mother   . Kidney disease Mother     Social History History  Substance Use Topics  . Smoking status: Never Smoker   . Smokeless tobacco: Never Used  . Alcohol  Use: No    Allergies  Allergen Reactions  . Ace Inhibitors Cough    Current Outpatient Prescriptions  Medication Sig Dispense Refill  . amLODipine (NORVASC) 10 MG tablet Take 10 mg by mouth daily.       Marland Kitchen aspirin 325 MG tablet Take 325 mg by mouth daily.       Marland Kitchen b complex-vitamin c-folic acid (NEPHRO-VITE) 0.8 MG TABS Take 0.8 mg by mouth at bedtime.      . calcium acetate (PHOSLO) 667 MG capsule Take 1,334 mg by mouth 3 (three) times daily with meals.      . carvedilol (COREG) 25 MG tablet Take 0.5 tablets (12.5 mg total) by mouth 2 (two) times daily.  1 tablet  0  . cloNIDine (CATAPRES) 0.2 MG tablet Take 0.2 mg by mouth 3 (three) times daily.       . hydrALAZINE (APRESOLINE) 50 MG tablet Take 50 mg by mouth 2 (two) times daily.       . isosorbide dinitrate (ISORDIL) 20 MG tablet Take 20 mg by mouth 2 (two) times daily.       Marland Kitchen oxyCODONE (OXY IR/ROXICODONE) 5 MG immediate release tablet Take 5 mg by mouth every 4 (four) hours as needed. gout pain        Review of Systems Review of Systems All other review of  systems negative or noncontributory except as stated in the HPI  There were no vitals taken for this visit.  Physical Exam Physical Exam Physical Exam  Vitals reviewed. Constitutional: He is oriented to person, place, and time. He appears well-developed and well-nourished. No distress.  HENT:  Head: Normocephalic and atraumatic.  Mouth/Throat: No oropharyngeal exudate.  Eyes: Conjunctivae and EOM are normal. Pupils are equal, round, and reactive to light. Right eye exhibits no discharge. Left eye exhibits no discharge. No scleral icterus.  Neck: Normal range of motion. No tracheal deviation present.  Cardiovascular: Normal rate, regular rhythm and normal heart sounds.   Pulmonary/Chest: Effort normal and breath sounds normal. No stridor. No respiratory distress. He has no wheezes. He has no rales. He exhibits no tenderness.  Abdominal: Soft. Bowel sounds are normal. He  exhibits no distension. There is no tenderness. There is no rebound and no guarding. he has a large umbilical bulge consistent with an umbilical hernia.  This is not completely reduced today but is nontender and no sign of strangulation.  There is no evidence of infection, but his skin is thin overlying the mass. Musculoskeletal: Normal range of motion. He exhibits no edema and no tenderness.  Neurological: He is alert and oriented to person, place, and time.  Skin: Skin is warm and dry. No rash noted. He is not diaphoretic. No erythema. No pallor.  Psychiatric: He has a normal mood and affect. His behavior is normal. Judgment and thought content normal.    Data Reviewed   Assessment    Incarcerated umbilical hernia CAD Hx of MI Hx of stroke Renal failure She has a fairly large and incarcerated umbilical hernia though this is most likely omental fat containing. He has no signs of strangulation and is relatively asymptomatic from this. The skin is thin overlying the hernia but I do not see any evidence of drainage or infection. I discussed with him the options for continued observation versus laparoscopic or open hernia repair and for him I would recommend laparoscopic repair due to the size of as well as his other comorbid conditions. I think that this would minimize his risk for wound infection. I discussed with him the procedure as well as the risks of the procedure including infection, bleeding, pain, scarring, recurrence, chronic pain and persistent symptoms, and bowel injury and death and he expressed understanding and desires to proceed with laparoscopic umbilical hernia repair with mesh.    Plan    We will obtain cardiac clearance prior to his procedure and we will likely schedule this for a Tuesday or Thursday and plan for overnight admission and he can be dialyzed on the following day and if stable, and likely discharge       [place], [place] 12/23/2011, 4:41 PM

## 2012-01-06 ENCOUNTER — Telehealth (INDEPENDENT_AMBULATORY_CARE_PROVIDER_SITE_OTHER): Payer: Self-pay

## 2012-01-06 NOTE — Telephone Encounter (Signed)
2nd request faxed to Dr. Merrilee Jansky office for Pre-Op Cardiac Clearance.

## 2012-02-14 DIAGNOSIS — I428 Other cardiomyopathies: Secondary | ICD-10-CM | POA: Insufficient documentation

## 2012-03-07 DIAGNOSIS — T82898D Other specified complication of vascular prosthetic devices, implants and grafts, subsequent encounter: Secondary | ICD-10-CM | POA: Insufficient documentation

## 2012-06-20 ENCOUNTER — Encounter (INDEPENDENT_AMBULATORY_CARE_PROVIDER_SITE_OTHER): Payer: Self-pay | Admitting: General Surgery

## 2012-06-20 ENCOUNTER — Telehealth (INDEPENDENT_AMBULATORY_CARE_PROVIDER_SITE_OTHER): Payer: Self-pay

## 2012-06-20 ENCOUNTER — Encounter (HOSPITAL_COMMUNITY): Payer: Self-pay | Admitting: Pharmacy Technician

## 2012-06-20 ENCOUNTER — Ambulatory Visit (INDEPENDENT_AMBULATORY_CARE_PROVIDER_SITE_OTHER): Payer: BC Managed Care – PPO | Admitting: General Surgery

## 2012-06-20 VITALS — BP 162/84 | HR 80 | Temp 98.8°F | Resp 16 | Ht 73.0 in | Wt 273.6 lb

## 2012-06-20 DIAGNOSIS — K420 Umbilical hernia with obstruction, without gangrene: Secondary | ICD-10-CM

## 2012-06-20 NOTE — Telephone Encounter (Signed)
Called to request copy of pre-op cardiac clearance for patient.  Office will fax it over

## 2012-06-20 NOTE — Progress Notes (Signed)
Patient ID: [name], male   DOB: December 08, 1960, 52 y.o.   MRN: [id number]  Chief Complaint  Patient presents with  . Re-evaluation    reck umb hernia    HPI [name] is a 52 y.o. male.  This patient is known to me for prior evaluation of an umbilical hernia. This is currently asymptomatic but he says it has been increasing in size. He denies any obstructive symptoms from this. He says that he recently visited his cardiologist in December, Dr. Doylene Canard and he received verbal okay to proceed with surgery. He does have a history of renal failure and is on dialysis. HPI  Past Medical History  Diagnosis Date  . CHF (congestive heart failure)   . History of unilateral nephrectomy   . Hypertension   . Cardiomyopathy   . Gout   . Renal cell carcinoma   . Coronary artery disease   . Myocardial infarction ?2006  . Shortness of breath 05/19/11    "at rest, lying down, w/exertion"  . Umbilical hernia [id number]    unrepaired  . Stroke 02/2011    05/19/11 denies residual  . Dialysis patient     Mon-Wed-Fri  . Diabetes mellitus     pt. states he's borderline diabetic.  Marland Kitchen ESRD (end stage renal disease)     Past Surgical History  Procedure Date  . Nephrectomy 2000    right  . Av fistula placement 03/29/2011    Procedure: ARTERIOVENOUS (AV) FISTULA CREATION;  Surgeon: Hinda Lenis, MD;  Location: [facility];  Service: Vascular;  Laterality: Left;  LEFT RADIOCEPHALIC , Arteriovenous [id number])  . Smashed 1990's    "left pinky; have a plate in there"  . US echocardiography 05/20/11  . Cardiac catheterization 05/21/11    Family History  Problem Relation Age of Onset  . Hypertension Mother   . Kidney disease Mother     Social History History  Substance Use Topics  . Smoking status: Never Smoker   . Smokeless tobacco: Never Used  . Alcohol Use: No    Allergies  Allergen Reactions  . Ace Inhibitors Cough    Current Outpatient Prescriptions  Medication Sig Dispense  Refill  . amLODipine (NORVASC) 10 MG tablet Take 10 mg by mouth daily.       Marland Kitchen aspirin 325 MG tablet Take 325 mg by mouth daily.       Marland Kitchen b complex-vitamin c-folic acid (NEPHRO-VITE) 0.8 MG TABS Take 0.8 mg by mouth at bedtime.      . calcium acetate (PHOSLO) 667 MG capsule Take 1,334 mg by mouth 3 (three) times daily with meals.      . carvedilol (COREG) 25 MG tablet Take 0.5 tablets (12.5 mg total) by mouth 2 (two) times daily.  1 tablet  0  . cloNIDine (CATAPRES) 0.2 MG tablet Take 0.2 mg by mouth 3 (three) times daily.       . hydrALAZINE (APRESOLINE) 50 MG tablet Take 50 mg by mouth 2 (two) times daily.       . isosorbide dinitrate (ISORDIL) 20 MG tablet Take 20 mg by mouth 2 (two) times daily.       Marland Kitchen oxyCODONE (OXY IR/ROXICODONE) 5 MG immediate release tablet Take 5 mg by mouth every 4 (four) hours as needed. gout pain        Review of Systems Review of Systems All other review of systems negative or noncontributory except as stated in the HPI  Blood pressure 162/84, pulse 80, temperature 98.8 F (  37.1 C), temperature source Oral, resp. rate 16, height 6\' 1"  (1.854 m), weight 273 lb 9.6 oz (124.104 kg).  Physical Exam Physical Exam Physical Exam  Vitals reviewed. Constitutional: He is oriented to person, place, and time. He appears well-developed and well-nourished. No distress.  HENT:  Head: Normocephalic and atraumatic.  Mouth/Throat: No oropharyngeal exudate.  Eyes: Conjunctivae and EOM are normal. Pupils are equal, round, and reactive to light. Right eye exhibits no discharge. Left eye exhibits no discharge. No scleral icterus.  Neck: Normal range of motion. No tracheal deviation present.  Cardiovascular: Normal rate, regular rhythm and normal heart sounds.   Pulmonary/Chest: Effort normal and breath sounds normal. No stridor. No respiratory distress. He has no wheezes. He has no rales. He exhibits no tenderness.  Abdominal: Soft. Bowel sounds are normal. He exhibits no  distension and no mass. There is no tenderness. There is no rebound and no guarding. He has a RUQ scar.  Also with an incarcerated, but nontender umbilical hernia. Musculoskeletal: Normal range of motion. He exhibits no edema and no tenderness.  Neurological: He is alert and oriented to person, place, and time.  Skin: Skin is warm and dry. No rash noted. He is not diaphoretic. No erythema. No pallor.  Psychiatric: He has a normal mood and affect. His behavior is normal. Judgment and thought content normal.    Data Reviewed   Assessment    Umbilical hernia. This patient is incarcerated umbilical hernia without evidence of strangulation. He says that has been increasing in size and he would like to have this repaired. I think that laparoscopic repair would be best for him given his obesity and renal failure. We discussed the pros and cons and the risks and benefits of the open and laparoscopic repair and he agrees with laparoscopic umbilical hernia repair with mesh. We discussed the risks of the procedure including infection, bleeding, pain, scarring, recurrence, seroma and persistent bulge, bowel injury and the need for open surgery and he expressed understanding and desires to proceed with laparoscopic umbilical hernia repair with mesh    Plan     we will set him up for a laparoscopic umbilical hernia repairwith mesh after cardiac clearance has been received. We will also try and set this up for a Tuesday so we can coordinate this with his dialysis while inpatient.       [place], [place] 06/20/2012, 9:24 AM

## 2012-06-20 NOTE — Telephone Encounter (Signed)
Pre-Op Cardiac Clearance faxed to Dr. Doylene Canard @ [phone]

## 2012-06-28 NOTE — Pre-Procedure Instructions (Signed)
Aulden Moren  06/28/2012   Your procedure is scheduled on:  Tuesday July 04, 2012  Report to [facility] at 10:15 AM.  Call this number if you have problems the morning of surgery: [phone]   Remember:   Do not eat food or drink liquids after midnight. On MONDAY  Take these medicines the morning of surgery with A SIP OF WATER: amlodipine, carvedilol, clonidine, apresoline, isosorbide, oxycodone   Do not wear jewelry  Do not wear lotions, powders, or perfumes.    Do not bring valuables to the hospital.  Contacts, dentures or bridgework may not be worn into surgery.  Leave suitcase in the car. After surgery it may be brought to your room.     Patients discharged the day of surgery will not be allowed to drive home.  Name and phone number of your driver: family / friend  Special Instructions: Shower using CHG 2 nights before surgery and the night before surgery.  If you shower the day of surgery use CHG.  Use special wash - you have one bottle of CHG for all showers.  You should use approximately 1/3 of the bottle for each shower.   Please read over the following fact sheets that you were given: Pain Booklet, Coughing and Deep Breathing, MRSA Information and Surgical Site Infection Prevention

## 2012-06-29 ENCOUNTER — Encounter (HOSPITAL_COMMUNITY): Payer: Self-pay

## 2012-06-29 ENCOUNTER — Encounter (HOSPITAL_COMMUNITY)
Admission: RE | Admit: 2012-06-29 | Discharge: 2012-06-29 | Disposition: A | Discharge status: Home / Self Care | Payer: BC Managed Care – PPO | Source: Ambulatory Visit | Attending: General Surgery | Admitting: General Surgery

## 2012-06-29 ENCOUNTER — Ambulatory Visit (HOSPITAL_COMMUNITY)
Admission: RE | Admit: 2012-06-29 | Discharge: 2012-06-29 | Disposition: A | Discharge status: Home / Self Care | Payer: BC Managed Care – PPO | Source: Ambulatory Visit | Attending: Anesthesiology | Admitting: Anesthesiology

## 2012-06-29 VITALS — BP 144/104 | HR 76 | Temp 98.0°F | Resp 20 | Ht 73.0 in | Wt 274.7 lb

## 2012-06-29 DIAGNOSIS — K420 Umbilical hernia with obstruction, without gangrene: Secondary | ICD-10-CM

## 2012-06-29 LAB — CBC WITH DIFFERENTIAL/PLATELET
Basophils Absolute: 0 10*3/uL (ref 0.0–0.1)
Basophils Relative: 1 % (ref 0–1)
Eosinophils Absolute: 0.3 10*3/uL (ref 0.0–0.7)
Eosinophils Relative: 8 % — ABNORMAL HIGH (ref 0–5)
HCT: 34.7 % — ABNORMAL LOW (ref 39.0–52.0)
Hemoglobin: 11.5 g/dL — ABNORMAL LOW (ref 13.0–17.0)
Lymphocytes Relative: 30 % (ref 12–46)
Lymphs Abs: 1.2 10*3/uL (ref 0.7–4.0)
MCH: 31.2 pg (ref 26.0–34.0)
MCHC: 33.1 g/dL (ref 30.0–36.0)
MCV: 94 fL (ref 78.0–100.0)
Monocytes Absolute: 0.5 10*3/uL (ref 0.1–1.0)
Monocytes Relative: 13 % — ABNORMAL HIGH (ref 3–12)
Neutro Abs: 1.9 10*3/uL (ref 1.7–7.7)
Neutrophils Relative %: 49 % (ref 43–77)
Platelets: 157 10*3/uL (ref 150–400)
RBC: 3.69 MIL/uL — ABNORMAL LOW (ref 4.22–5.81)
RDW: 14.2 % (ref 11.5–15.5)
WBC: 4 10*3/uL (ref 4.0–10.5)

## 2012-06-29 LAB — PROTIME-INR
INR: 0.91 (ref 0.00–1.49)
Prothrombin Time: 12.2 seconds (ref 11.6–15.2)

## 2012-06-29 LAB — BASIC METABOLIC PANEL
BUN: 27 mg/dL — ABNORMAL HIGH (ref 6–23)
CO2: 32 mEq/L (ref 19–32)
Calcium: 10.3 mg/dL (ref 8.4–10.5)
Chloride: 101 mEq/L (ref 96–112)
Creatinine, Ser: 6.57 mg/dL — ABNORMAL HIGH (ref 0.50–1.35)
GFR calc Af Amer: 10 mL/min — ABNORMAL LOW (ref >90)
GFR calc non Af Amer: 9 mL/min — ABNORMAL LOW (ref >90)
Glucose, Bld: 80 mg/dL (ref 70–99)
Potassium: 4.6 mEq/L (ref 3.5–5.1)
Sodium: 142 mEq/L (ref 135–145)

## 2012-06-29 LAB — SURGICAL PCR SCREEN
MRSA, PCR: NEGATIVE
Staphylococcus aureus: NEGATIVE

## 2012-06-29 NOTE — Progress Notes (Signed)
Report given to A. Zelenak , PAC due to pt. Cardiac history.

## 2012-06-29 NOTE — Progress Notes (Addendum)
Faxed request to Dr. Doylene Canard for cardiac records. Pt. Reports that he had a recent visit with Dr. Raliegh Ip. For surgery clearance.  Faxed sleep apnea assessment to Dr. French Ana.

## 2012-06-30 ENCOUNTER — Telehealth (INDEPENDENT_AMBULATORY_CARE_PROVIDER_SITE_OTHER): Payer: Self-pay

## 2012-06-30 ENCOUNTER — Encounter (HOSPITAL_COMMUNITY): Payer: Self-pay | Admitting: Vascular Surgery

## 2012-06-30 ENCOUNTER — Encounter (INDEPENDENT_AMBULATORY_CARE_PROVIDER_SITE_OTHER): Payer: Self-pay

## 2012-06-30 NOTE — Telephone Encounter (Signed)
[name] at [facility] stay called stating they have not received cardiac clearance note and cannot find note in epic. I saw 3 msg in epic that it has been requested. I advised [name] I will send msg to Alyse Low to  call her re: need for clearance [phone].

## 2012-06-30 NOTE — Consult Note (Signed)
Anesthesia Chart Review:  Patient is a 52 year old male scheduled laparoscopic umbilical hernia repair with mesh, possibly open by Dr. Lilyan Punt on 07/04/12.    History includes CKD on hemodialysis MWF, DM2, HTN, cardiomyopathy with normal coronaries by 2010 cath, CHF, questionable history of MI '06, CVA '12 without residual, renal cell carcinoma s/p right nephrectomy, gout.  He has a left UE AVF.  Cardiologist is Dr. Doylene Canard. Dr. Lilyan Punt has requested cardiac clearance.  According to Dr. Merrilee Jansky note from 04/26/12, "May undergo umbilical hernia surgery."   EKG on 06/29/12 showed NSR, possible LAE, LVH, T wave abnormality, consider ischemia.  No significant change since 06/28/11.    Nuclear stress test on 08/19/11 showed: 1. Dilated left ventricle with global hypokinesis and estimated ejection fraction of 26%.  2. Anteroseptal and inferior wall scar/infarction.  3. No findings for ischemia.  Echo on 11/16/11 (Dr. Merrilee Jansky office, see 11/16/11 office note) showed: dilated LV size and moderate LVH and systolic dysfunction.  Mild diastolic dysfunction.  Moderate generalized hypokinesis, EF 30%.  Normal RV size and systolic function. Markedly dilated LA.  Mild MR/TR. Aortic root dilatation (aorta 4.22 cm).      He had a right heart cath on 05/21/11 that showed:  1. Mild to moderate elevated right heart and wedge pressures.  2. Dilated cardiomyopathy with preserved cardiac output.  Dr. Doylene Canard recommended resuming Lasix, renal consult, and avoid inotropic medications for now. His Coreg was ultimately reduced by 50%.   He had a left and right HC on 10/10/08 (scanned under Media tab, Encounter date 06/07/11 entitled CARDIOVASCULAR) which I found in E-chart that showed normal coronaries, moderate LV systolic dysfunction with mild elevation of LVEDP and Wedge pressures. Medical treatment was recommended.  CXR on 06/29/12 showed stable cardiac enlargement, no acute pulmonary findings.  Pre-operative labs noted.   He will get an ISTAT on arrival.  He has been cleared by his cardiologist.  If follow-up labs are acceptable and no significant change in his status then anticipate he can proceed as planned.  Myra Gianotti, PA-C 06/30/12 [phone]

## 2012-06-30 NOTE — Telephone Encounter (Signed)
Cardiac clearance rec'd from Dr. Doylene Canard, medical records scanned in patients chart under media.  Called Allison @ Pre-Op and made her aware that we have rec'd the patient clearance

## 2012-06-30 NOTE — Telephone Encounter (Signed)
Called Dr. Merrilee Jansky office requesting a cardiac clearance in writing to be faxed over to our office urgently.  Also, called Ebony Hail in Pre-Op to let her know we are waiting for a written copy of patient surgical cardiac clearance to be faxed over to our office.  Lucina @ Dr. Merrilee Jansky office is planning to fax form over today 06/30/12.

## 2012-07-03 MED ORDER — CEFAZOLIN SODIUM-DEXTROSE 2-3 GM-% IV SOLR
2.0000 g | INTRAVENOUS | Status: AC
  Administered 2012-07-04: 2 g via INTRAVENOUS
  Filled 2012-07-03: qty 50

## 2012-07-03 MED ORDER — HEPARIN SODIUM (PORCINE) 5000 UNIT/ML IJ SOLN
5000.0000 [IU] | Freq: Once | INTRAMUSCULAR | Status: AC
  Administered 2012-07-04: 5000 [IU] via SUBCUTANEOUS
  Filled 2012-07-03 (×3): qty 1

## 2012-07-04 ENCOUNTER — Ambulatory Visit (HOSPITAL_COMMUNITY): Payer: BC Managed Care – PPO | Admitting: Vascular Surgery

## 2012-07-04 ENCOUNTER — Encounter (HOSPITAL_COMMUNITY): Payer: Self-pay | Admitting: Vascular Surgery

## 2012-07-04 ENCOUNTER — Encounter (HOSPITAL_COMMUNITY)
Admission: RE | Disposition: A | Discharge status: Home / Self Care | Payer: Self-pay | Source: Ambulatory Visit | Attending: General Surgery

## 2012-07-04 ENCOUNTER — Inpatient Hospital Stay (HOSPITAL_COMMUNITY)
Admission: RE | Admit: 2012-07-04 | Discharge: 2012-07-08 | DRG: 159 | Disposition: A | Discharge status: Home / Self Care | Payer: BC Managed Care – PPO | Source: Ambulatory Visit | Attending: General Surgery | Admitting: General Surgery

## 2012-07-04 ENCOUNTER — Encounter (HOSPITAL_COMMUNITY): Payer: Self-pay | Admitting: Anesthesiology

## 2012-07-04 DIAGNOSIS — E119 Type 2 diabetes mellitus without complications: Secondary | ICD-10-CM | POA: Diagnosis present

## 2012-07-04 DIAGNOSIS — I2510 Atherosclerotic heart disease of native coronary artery without angina pectoris: Secondary | ICD-10-CM | POA: Diagnosis present

## 2012-07-04 DIAGNOSIS — Z79899 Other long term (current) drug therapy: Secondary | ICD-10-CM

## 2012-07-04 DIAGNOSIS — Z992 Dependence on renal dialysis: Secondary | ICD-10-CM

## 2012-07-04 DIAGNOSIS — D631 Anemia in chronic kidney disease: Secondary | ICD-10-CM | POA: Diagnosis present

## 2012-07-04 DIAGNOSIS — Z7982 Long term (current) use of aspirin: Secondary | ICD-10-CM

## 2012-07-04 DIAGNOSIS — N2581 Secondary hyperparathyroidism of renal origin: Secondary | ICD-10-CM | POA: Diagnosis present

## 2012-07-04 DIAGNOSIS — I252 Old myocardial infarction: Secondary | ICD-10-CM

## 2012-07-04 DIAGNOSIS — M109 Gout, unspecified: Secondary | ICD-10-CM | POA: Diagnosis present

## 2012-07-04 DIAGNOSIS — E669 Obesity, unspecified: Secondary | ICD-10-CM | POA: Diagnosis present

## 2012-07-04 DIAGNOSIS — I509 Heart failure, unspecified: Secondary | ICD-10-CM | POA: Diagnosis present

## 2012-07-04 DIAGNOSIS — Z905 Acquired absence of kidney: Secondary | ICD-10-CM

## 2012-07-04 DIAGNOSIS — R5381 Other malaise: Secondary | ICD-10-CM | POA: Diagnosis not present

## 2012-07-04 DIAGNOSIS — Z85528 Personal history of other malignant neoplasm of kidney: Secondary | ICD-10-CM

## 2012-07-04 DIAGNOSIS — I120 Hypertensive chronic kidney disease with stage 5 chronic kidney disease or end stage renal disease: Secondary | ICD-10-CM | POA: Diagnosis present

## 2012-07-04 DIAGNOSIS — K420 Umbilical hernia with obstruction, without gangrene: Principal | ICD-10-CM | POA: Diagnosis present

## 2012-07-04 DIAGNOSIS — N186 End stage renal disease: Secondary | ICD-10-CM | POA: Diagnosis present

## 2012-07-04 DIAGNOSIS — K436 Other and unspecified ventral hernia with obstruction, without gangrene: Secondary | ICD-10-CM

## 2012-07-04 DIAGNOSIS — I428 Other cardiomyopathies: Secondary | ICD-10-CM | POA: Diagnosis present

## 2012-07-04 DIAGNOSIS — E869 Volume depletion, unspecified: Secondary | ICD-10-CM | POA: Diagnosis not present

## 2012-07-04 DIAGNOSIS — Z6835 Body mass index (BMI) 35.0-35.9, adult: Secondary | ICD-10-CM

## 2012-07-04 DIAGNOSIS — Z888 Allergy status to other drugs, medicaments and biological substances status: Secondary | ICD-10-CM

## 2012-07-04 DIAGNOSIS — Z8673 Personal history of transient ischemic attack (TIA), and cerebral infarction without residual deficits: Secondary | ICD-10-CM

## 2012-07-04 HISTORY — PX: HERNIA REPAIR: SHX51

## 2012-07-04 HISTORY — PX: UMBILICAL HERNIA REPAIR: SHX196

## 2012-07-04 HISTORY — PX: INSERTION OF MESH: SHX5868

## 2012-07-04 HISTORY — DX: Acquired absence of kidney: Z90.5

## 2012-07-04 LAB — GLUCOSE, CAPILLARY
Glucose-Capillary: 83 mg/dL (ref 70–99)
Glucose-Capillary: 112 mg/dL — ABNORMAL HIGH (ref 70–99)

## 2012-07-04 LAB — POCT I-STAT 4, (NA,K, GLUC, HGB,HCT)
Glucose, Bld: 95 mg/dL (ref 70–99)
HCT: 31 % — ABNORMAL LOW (ref 39.0–52.0)
Hemoglobin: 10.5 g/dL — ABNORMAL LOW (ref 13.0–17.0)
Potassium: 4.8 mEq/L (ref 3.5–5.1)
Sodium: 139 mEq/L (ref 135–145)

## 2012-07-04 SURGERY — REPAIR, HERNIA, UMBILICAL, LAPAROSCOPIC
Anesthesia: General | Site: Abdomen | Wound class: Clean

## 2012-07-04 MED ORDER — MIDAZOLAM HCL 5 MG/5ML IJ SOLN
INTRAMUSCULAR | Status: DC | PRN
  Administered 2012-07-04 (×2): 1 mg via INTRAVENOUS

## 2012-07-04 MED ORDER — GLYCOPYRROLATE 0.2 MG/ML IJ SOLN
INTRAMUSCULAR | Status: DC | PRN
  Administered 2012-07-04: .6 mg via INTRAVENOUS
  Administered 2012-07-04: 0.2 mg via INTRAVENOUS

## 2012-07-04 MED ORDER — SODIUM CHLORIDE 0.9 % IV SOLN
INTRAVENOUS | Status: DC | PRN
  Administered 2012-07-04: 12:00:00 via INTRAVENOUS

## 2012-07-04 MED ORDER — MORPHINE SULFATE 2 MG/ML IJ SOLN
1.0000 mg | INTRAMUSCULAR | Status: DC | PRN
  Administered 2012-07-04: 4 mg via INTRAVENOUS
  Administered 2012-07-04: 2 mg via INTRAVENOUS
  Administered 2012-07-05: 4 mg via INTRAVENOUS
  Administered 2012-07-05 (×2): 2 mg via INTRAVENOUS
  Administered 2012-07-05: 4 mg via INTRAVENOUS
  Filled 2012-07-04: qty 1
  Filled 2012-07-04: qty 2
  Filled 2012-07-04: qty 1
  Filled 2012-07-04: qty 2
  Filled 2012-07-04: qty 1
  Filled 2012-07-04: qty 2

## 2012-07-04 MED ORDER — NEOSTIGMINE METHYLSULFATE 1 MG/ML IJ SOLN
INTRAMUSCULAR | Status: DC | PRN
  Administered 2012-07-04: 5 mg via INTRAVENOUS

## 2012-07-04 MED ORDER — HEPARIN SODIUM (PORCINE) 1000 UNIT/ML DIALYSIS
1000.0000 [IU] | INTRAMUSCULAR | Status: DC | PRN
  Filled 2012-07-04: qty 1

## 2012-07-04 MED ORDER — PROMETHAZINE HCL 25 MG/ML IJ SOLN: 6.2500 mg | INTRAMUSCULAR | Status: DC | PRN

## 2012-07-04 MED ORDER — ISOSORBIDE DINITRATE 20 MG PO TABS
20.0000 mg | ORAL_TABLET | Freq: Two times a day (BID) | ORAL | Status: DC
  Administered 2012-07-04 – 2012-07-08 (×7): 20 mg via ORAL
  Filled 2012-07-04 (×9): qty 1

## 2012-07-04 MED ORDER — DOXERCALCIFEROL 4 MCG/2ML IV SOLN
2.0000 ug | INTRAVENOUS | Status: DC
  Administered 2012-07-05 – 2012-07-07 (×2): 2 ug via INTRAVENOUS
  Filled 2012-07-04 (×2): qty 2

## 2012-07-04 MED ORDER — DARBEPOETIN ALFA-POLYSORBATE 40 MCG/0.4ML IJ SOLN
40.0000 ug | INTRAMUSCULAR | Status: DC
  Administered 2012-07-05: 40 ug via INTRAVENOUS
  Filled 2012-07-04: qty 0.4

## 2012-07-04 MED ORDER — AMLODIPINE BESYLATE 10 MG PO TABS
10.0000 mg | ORAL_TABLET | Freq: Every day | ORAL | Status: DC
  Administered 2012-07-05 – 2012-07-07 (×3): 10 mg via ORAL
  Filled 2012-07-04 (×4): qty 1

## 2012-07-04 MED ORDER — LIDOCAINE HCL (PF) 1 % IJ SOLN: 5.0000 mL | INTRAMUSCULAR | Status: DC | PRN

## 2012-07-04 MED ORDER — MIDAZOLAM HCL 2 MG/2ML IJ SOLN: 0.5000 mg | Freq: Once | INTRAMUSCULAR | Status: DC | PRN

## 2012-07-04 MED ORDER — NEPRO/CARBSTEADY PO LIQD: 237.0000 mL | ORAL | Status: DC | PRN

## 2012-07-04 MED ORDER — SODIUM CHLORIDE 0.9 % IV SOLN: 100.0000 mL | INTRAVENOUS | Status: DC | PRN

## 2012-07-04 MED ORDER — PENTAFLUOROPROP-TETRAFLUOROETH EX AERO: 1.0000 "application " | INHALATION_SPRAY | CUTANEOUS | Status: DC | PRN

## 2012-07-04 MED ORDER — CEFAZOLIN SODIUM 1-5 GM-% IV SOLN
1.0000 g | Freq: Three times a day (TID) | INTRAVENOUS | Status: AC
  Administered 2012-07-04 – 2012-07-05 (×3): 1 g via INTRAVENOUS
  Filled 2012-07-04 (×4): qty 50

## 2012-07-04 MED ORDER — SODIUM CHLORIDE 0.9 % IV SOLN: INTRAVENOUS | Status: DC

## 2012-07-04 MED ORDER — ONDANSETRON HCL 4 MG/2ML IJ SOLN
INTRAMUSCULAR | Status: DC | PRN
  Administered 2012-07-04: 4 mg via INTRAVENOUS

## 2012-07-04 MED ORDER — LIDOCAINE-PRILOCAINE 2.5-2.5 % EX CREA: 1.0000 "application " | TOPICAL_CREAM | CUTANEOUS | Status: DC | PRN

## 2012-07-04 MED ORDER — PROPOFOL 10 MG/ML IV BOLUS
INTRAVENOUS | Status: DC | PRN
  Administered 2012-07-04: 90 mg via INTRAVENOUS

## 2012-07-04 MED ORDER — FENTANYL CITRATE 0.05 MG/ML IJ SOLN
INTRAMUSCULAR | Status: DC | PRN
  Administered 2012-07-04: 100 ug via INTRAVENOUS
  Administered 2012-07-04 (×2): 50 ug via INTRAVENOUS

## 2012-07-04 MED ORDER — 0.9 % SODIUM CHLORIDE (POUR BTL) OPTIME
TOPICAL | Status: DC | PRN
  Administered 2012-07-04: 1000 mL

## 2012-07-04 MED ORDER — LIDOCAINE HCL 1 % IJ SOLN
INTRAMUSCULAR | Status: DC | PRN
  Administered 2012-07-04: 60 mg via INTRADERMAL

## 2012-07-04 MED ORDER — HYDRALAZINE HCL 50 MG PO TABS
50.0000 mg | ORAL_TABLET | Freq: Two times a day (BID) | ORAL | Status: DC
  Administered 2012-07-04 – 2012-07-06 (×4): 50 mg via ORAL
  Filled 2012-07-04 (×7): qty 1

## 2012-07-04 MED ORDER — ROCURONIUM BROMIDE 100 MG/10ML IV SOLN
INTRAVENOUS | Status: DC | PRN
  Administered 2012-07-04: 50 mg via INTRAVENOUS

## 2012-07-04 MED ORDER — LIDOCAINE-EPINEPHRINE (PF) 1 %-1:200000 IJ SOLN
INTRAMUSCULAR | Status: DC | PRN
  Administered 2012-07-04: 12:00:00 via INTRADERMAL

## 2012-07-04 MED ORDER — ETOMIDATE 2 MG/ML IV SOLN
INTRAVENOUS | Status: DC | PRN
  Administered 2012-07-04: 8 mg via INTRAVENOUS

## 2012-07-04 MED ORDER — EPHEDRINE SULFATE 50 MG/ML IJ SOLN
INTRAMUSCULAR | Status: DC | PRN
  Administered 2012-07-04 (×5): 10 mg via INTRAVENOUS

## 2012-07-04 MED ORDER — OXYCODONE HCL 5 MG PO TABS
5.0000 mg | ORAL_TABLET | ORAL | Status: DC | PRN
  Administered 2012-07-04 – 2012-07-07 (×7): 10 mg via ORAL
  Administered 2012-07-08: 5 mg via ORAL
  Filled 2012-07-04 (×2): qty 2
  Filled 2012-07-04: qty 1
  Filled 2012-07-04 (×6): qty 2

## 2012-07-04 MED ORDER — HEPARIN SODIUM (PORCINE) 5000 UNIT/ML IJ SOLN
5000.0000 [IU] | Freq: Three times a day (TID) | INTRAMUSCULAR | Status: DC
  Administered 2012-07-05 – 2012-07-08 (×8): 5000 [IU] via SUBCUTANEOUS
  Filled 2012-07-04 (×14): qty 1

## 2012-07-04 MED ORDER — FENTANYL CITRATE 0.05 MG/ML IJ SOLN
INTRAMUSCULAR | Status: AC
  Filled 2012-07-04: qty 2

## 2012-07-04 MED ORDER — CLONIDINE HCL 0.2 MG PO TABS
0.2000 mg | ORAL_TABLET | Freq: Three times a day (TID) | ORAL | Status: DC
  Administered 2012-07-04 – 2012-07-06 (×6): 0.2 mg via ORAL
  Filled 2012-07-04 (×11): qty 1

## 2012-07-04 MED ORDER — RENA-VITE PO TABS
1.0000 | ORAL_TABLET | Freq: Every day | ORAL | Status: DC
  Administered 2012-07-06: 22:00:00 via ORAL
  Administered 2012-07-07: 1 via ORAL
  Filled 2012-07-04 (×5): qty 1

## 2012-07-04 MED ORDER — MEPERIDINE HCL 25 MG/ML IJ SOLN: 6.2500 mg | INTRAMUSCULAR | Status: DC | PRN

## 2012-07-04 MED ORDER — ASPIRIN 325 MG PO TABS
325.0000 mg | ORAL_TABLET | Freq: Every day | ORAL | Status: DC
  Administered 2012-07-04 – 2012-07-08 (×5): 325 mg via ORAL
  Filled 2012-07-04 (×5): qty 1

## 2012-07-04 MED ORDER — KETOROLAC TROMETHAMINE 30 MG/ML IJ SOLN: 15.0000 mg | Freq: Once | INTRAMUSCULAR | Status: DC | PRN

## 2012-07-04 MED ORDER — FENTANYL CITRATE 0.05 MG/ML IJ SOLN
25.0000 ug | INTRAMUSCULAR | Status: DC | PRN
  Administered 2012-07-04 (×2): 50 ug via INTRAVENOUS

## 2012-07-04 MED ORDER — ALTEPLASE 2 MG IJ SOLR
2.0000 mg | Freq: Once | INTRAMUSCULAR | Status: AC | PRN
  Filled 2012-07-04: qty 2

## 2012-07-04 MED ORDER — CALCIUM ACETATE 667 MG PO CAPS
1334.0000 mg | ORAL_CAPSULE | Freq: Three times a day (TID) | ORAL | Status: DC
  Administered 2012-07-05 – 2012-07-08 (×8): 1334 mg via ORAL
  Filled 2012-07-04 (×13): qty 2

## 2012-07-04 MED ORDER — PHENYLEPHRINE HCL 10 MG/ML IJ SOLN
INTRAMUSCULAR | Status: DC | PRN
  Administered 2012-07-04 (×6): 80 ug via INTRAVENOUS

## 2012-07-04 MED ORDER — CARVEDILOL 12.5 MG PO TABS
12.5000 mg | ORAL_TABLET | Freq: Two times a day (BID) | ORAL | Status: DC
  Administered 2012-07-05 – 2012-07-08 (×5): 12.5 mg via ORAL
  Filled 2012-07-04 (×10): qty 1

## 2012-07-04 MED ORDER — ONDANSETRON HCL 4 MG/2ML IJ SOLN
4.0000 mg | Freq: Four times a day (QID) | INTRAMUSCULAR | Status: DC | PRN
  Administered 2012-07-05 – 2012-07-06 (×2): 4 mg via INTRAVENOUS
  Filled 2012-07-04 (×3): qty 2

## 2012-07-04 MED ORDER — ONDANSETRON HCL 4 MG PO TABS
4.0000 mg | ORAL_TABLET | Freq: Four times a day (QID) | ORAL | Status: DC | PRN
  Administered 2012-07-07: 4 mg via ORAL
  Filled 2012-07-04: qty 1

## 2012-07-04 SURGICAL SUPPLY — 60 items
ADH SKN CLS APL DERMABOND .7 (GAUZE/BANDAGES/DRESSINGS) ×1
APPLIER CLIP LOGIC TI 5 (MISCELLANEOUS) ×1 IMPLANT
APPLIER CLIP ROT 10 11.4 M/L (STAPLE)
APR CLP MED LRG 11.4X10 (STAPLE)
APR CLP MED LRG 33X5 (MISCELLANEOUS) ×1
BAG SPEC RTRVL LRG 6X4 10 (ENDOMECHANICALS) ×1
BALL CTTN LRG ABS STRL LF (GAUZE/BANDAGES/DRESSINGS) ×1
BINDER ABD UNIV 12 45-62 (WOUND CARE) IMPLANT
BINDER ABDOMINAL 46IN 62IN (WOUND CARE) ×2
CANISTER SUCTION 2500CC (MISCELLANEOUS) IMPLANT
CHLORAPREP W/TINT 26ML (MISCELLANEOUS) ×3 IMPLANT
CLIP APPLIE ROT 10 11.4 M/L (STAPLE) IMPLANT
CLOTH BEACON ORANGE TIMEOUT ST (SAFETY) ×2 IMPLANT
COTTONBALL LRG STERILE PKG (GAUZE/BANDAGES/DRESSINGS) ×1 IMPLANT
COVER SURGICAL LIGHT HANDLE (MISCELLANEOUS) ×2 IMPLANT
DERMABOND ADVANCED (GAUZE/BANDAGES/DRESSINGS) ×1
DERMABOND ADVANCED .7 DNX12 (GAUZE/BANDAGES/DRESSINGS) ×1 IMPLANT
DEVICE SECURE STRAP 25 ABSORB (INSTRUMENTS) ×2 IMPLANT
DEVICE TROCAR PUNCTURE CLOSURE (ENDOMECHANICALS) ×2 IMPLANT
DRAPE INCISE IOBAN 66X45 STRL (DRAPES) ×2 IMPLANT
DRAPE UTILITY 15X26 W/TAPE STR (DRAPE) ×4 IMPLANT
DRSG TEGADERM 4X4.75 (GAUZE/BANDAGES/DRESSINGS) ×1 IMPLANT
ELECT REM PT RETURN 9FT ADLT (ELECTROSURGICAL) ×2
ELECTRODE REM PT RTRN 9FT ADLT (ELECTROSURGICAL) ×1 IMPLANT
GLOVE BIO SURGEON STRL SZ7 (GLOVE) IMPLANT
GLOVE BIOGEL PI IND STRL 6.5 (GLOVE) IMPLANT
GLOVE BIOGEL PI IND STRL 7.0 (GLOVE) IMPLANT
GLOVE BIOGEL PI INDICATOR 6.5 (GLOVE) ×1
GLOVE BIOGEL PI INDICATOR 7.0 (GLOVE) ×5
GLOVE SURG SS PI 7.5 STRL IVOR (GLOVE) ×4 IMPLANT
GOWN STRL NON-REIN LRG LVL3 (GOWN DISPOSABLE) ×5 IMPLANT
GOWN STRL REIN XL XLG (GOWN DISPOSABLE) ×1 IMPLANT
KIT BASIN OR (CUSTOM PROCEDURE TRAY) ×2 IMPLANT
KIT ROOM TURNOVER OR (KITS) ×2 IMPLANT
MARKER SKIN DUAL TIP RULER LAB (MISCELLANEOUS) ×2 IMPLANT
MESH PARIETEX 4.7 (Mesh General) ×1 IMPLANT
NDL SPNL 22GX3.5 QUINCKE BK (NEEDLE) ×1 IMPLANT
NEEDLE SPNL 22GX3.5 QUINCKE BK (NEEDLE) ×2 IMPLANT
NS IRRIG 1000ML POUR BTL (IV SOLUTION) ×2 IMPLANT
PAD ARMBOARD 7.5X6 YLW CONV (MISCELLANEOUS) ×4 IMPLANT
PENCIL BIPOLAR DISP STRAIT 18G (MISCELLANEOUS) ×1 IMPLANT
PENCIL BUTTON HOLSTER BLD 10FT (ELECTRODE) ×1 IMPLANT
POUCH SPECIMEN RETRIEVAL 10MM (ENDOMECHANICALS) ×1 IMPLANT
SCALPEL HARMONIC ACE (MISCELLANEOUS) IMPLANT
SCISSORS LAP 5X35 DISP (ENDOMECHANICALS) IMPLANT
SET IRRIG TUBING LAPAROSCOPIC (IRRIGATION / IRRIGATOR) IMPLANT
SLEEVE ENDOPATH XCEL 5M (ENDOMECHANICALS) ×2 IMPLANT
SUT GORETEX 48 CV 2 THX 26 (SUTURE) ×2 IMPLANT
SUT GORETEX CV 4 TH 22 36 (SUTURE) ×1 IMPLANT
SUT GORETEX NAB #0 THX26 36IN (SUTURE) ×2 IMPLANT
SUT MNCRL AB 4-0 PS2 18 (SUTURE) ×2 IMPLANT
SUT PROLENE 0 CT 1 CR/8 (SUTURE) ×3 IMPLANT
TACKER 5MM HERNIA 3.5CML NAB (ENDOMECHANICALS) IMPLANT
TOWEL OR 17X24 6PK STRL BLUE (TOWEL DISPOSABLE) ×2 IMPLANT
TOWEL OR 17X26 10 PK STRL BLUE (TOWEL DISPOSABLE) ×2 IMPLANT
TRAY FOLEY CATH 14FR (SET/KITS/TRAYS/PACK) ×2 IMPLANT
TRAY LAPAROSCOPIC (CUSTOM PROCEDURE TRAY) ×2 IMPLANT
TROCAR XCEL BLUNT TIP 100MML (ENDOMECHANICALS) IMPLANT
TROCAR XCEL NON-BLD 11X100MML (ENDOMECHANICALS) ×2 IMPLANT
TROCAR XCEL NON-BLD 5MMX100MML (ENDOMECHANICALS) ×3 IMPLANT

## 2012-07-04 NOTE — Anesthesia Procedure Notes (Signed)
Procedure Name: Intubation Date/Time: 07/04/2012 12:44 PM Performed by: Luane School A Pre-anesthesia Checklist: Patient identified Patient Re-evaluated:Patient Re-evaluated prior to inductionOxygen Delivery Method: Circle system utilized Preoxygenation: Pre-oxygenation with 100% oxygen Intubation Type: IV induction Ventilation: Mask ventilation without difficulty Laryngoscope Size: Miller and 3 Grade View: Grade II Tube type: Oral Tube size: 7.5 mm Number of attempts: 1 Airway Equipment and Method: Patient positioned with wedge pillow and Stylet Placement Confirmation: ETT inserted through vocal cords under direct vision,  positive ETCO2,  CO2 detector and breath sounds checked- equal and bilateral Secured at: 23 cm Tube secured with: Tape Dental Injury: Teeth and Oropharynx as per pre-operative assessment

## 2012-07-04 NOTE — Progress Notes (Signed)
[facility] Renal Consultation Note  Indication for Consultation:  Management of ESRD/hemodialysis; anemia, hypertension/volume and secondary hyperparathyroidism  HPI: [name] is a 52 y.o. male with ESRD on dialysis on MWF at the [facility] who presented today for previously scheduled laparoscopic umbilical hernia repair with mesh per Dr. Madilyn Hook.  The patient has been followed by Dr. Lilyan Punt for an incarcerated umbilical hernia without evidence of strangulation, but states that it is increasing in size and feels he is ready to have it repaired.  In December he was cleared by his cardiologist, Dr. Dixie Dials, to proceed with surgery.  He was seen post-surgery in recovery and is very somnolent, but denies any pain.  Tomorrow he will have dialysis on his regularly scheduled day before his likely discharge.   Dialysis Orders: Center: [facility] on MWF.  EDW 121.5 kg  HD Bath 2K/2.25Ca  Time 4 hrs 15 mins  Heparin 12,000 U bolus, 4000 U mid-Tx. Access AVF @ LFA  BFR 450  DFR 800   Hectorol 2 mcg IV/HD  Epogen 1000 Units IV/HD  Venofer 0.   Past Medical History  Diagnosis Date  . CHF (congestive heart failure)   . History of unilateral nephrectomy   . Hypertension   . Cardiomyopathy   . Gout   . Myocardial infarction ?2006  . Shortness of breath 05/19/11    "at rest, lying down, w/exertion"  . Umbilical hernia [id number]    unrepaired  . Stroke 02/2011    05/19/11 denies residual  . Dialysis patient     Mon-Wed-Fri  . Diabetes mellitus     pt. states he's borderline diabetic., no longer taking med,- off med. since 2013  . Renal cell carcinoma     dialysis- MWF  . ESRD (end stage renal disease)   . Coronary artery disease     normal coronaries by 10/10/08 cath  . History of nephrectomy 07/04/2012    History of right nephrectomy in 2002 for renal cell carcinoma    Past Surgical History  Procedure Laterality Date  . Nephrectomy  2000    right  . Av  fistula placement  03/29/2011    Procedure: ARTERIOVENOUS (AV) FISTULA CREATION;  Surgeon: Hinda Lenis, MD;  Location: [place] [facility];  Service: Vascular;  Laterality: Left;  LEFT RADIOCEPHALIC , Arteriovenous [id number])  . Smashed  1990's    "left pinky; have a plate in there"  . US echocardiography  05/20/11  . Cardiac catheterization  05/21/11  . Finger surgery      L pinkie finger- ORIF- /w remaining hardware - 64's     Family History  Problem Relation Age of Onset  . Hypertension Mother   . Kidney disease Mother    Social History He denies any history of tobacco, alcohol, or illicit drug use.  Allergies  Allergen Reactions  . Ace Inhibitors Cough   Prior to Admission medications   Medication Sig Start Date End Date Taking? Authorizing Provider  amLODipine (NORVASC) 10 MG tablet Take 10 mg by mouth daily after lunch.    Yes Historical Provider, MD  aspirin 325 MG tablet Take 325 mg by mouth daily.    Yes Historical Provider, MD  b complex-vitamin c-folic acid (NEPHRO-VITE) 0.8 MG TABS Take 0.8 mg by mouth at bedtime.   Yes Historical Provider, MD  calcium acetate (PHOSLO) 667 MG capsule Take 1,334 mg by mouth 3 (three) times daily with meals.   Yes Historical Provider, MD  carvedilol (  COREG) 25 MG tablet Take 0.5 tablets (12.5 mg total) by mouth 2 (two) times daily. 05/24/11  Yes Birdie Riddle, MD  cloNIDine (CATAPRES) 0.2 MG tablet Take 0.2 mg by mouth 3 (three) times daily.    Yes Historical Provider, MD  hydrALAZINE (APRESOLINE) 50 MG tablet Take 50 mg by mouth 2 (two) times daily.    Yes Historical Provider, MD  isosorbide dinitrate (ISORDIL) 20 MG tablet Take 20 mg by mouth 2 (two) times daily.    Yes Historical Provider, MD  oxyCODONE (OXY IR/ROXICODONE) 5 MG immediate release tablet Take 5 mg by mouth every 4 (four) hours as needed. gout pain 03/29/11  Yes Conrad [place], MD   Labs:  Results for orders placed during the hospital encounter of 07/04/12 (from the past 48  hour(s))  POCT I-STAT 4, (NA,K, GLUC, HGB,HCT)     Status: Abnormal   Collection Time    07/04/12 10:46 AM      Result Value Range   Sodium 139  135 - 145 mEq/L   Potassium 4.8  3.5 - 5.1 mEq/L   Glucose, Bld 95  70 - 99 mg/dL   HCT 31.0 (*) 39.0 - 52.0 %   Hemoglobin 10.5 (*) 13.0 - 17.0 g/dL  GLUCOSE, CAPILLARY     Status: None   Collection Time    07/04/12 12:14 PM      Result Value Range   Glucose-Capillary 83  70 - 99 mg/dL  GLUCOSE, CAPILLARY     Status: Abnormal   Collection Time    07/04/12  3:12 PM      Result Value Range   Glucose-Capillary 112 (*) 70 - 99 mg/dL   Comment 1 Notify RN     Review of systems not obtained due to patient factors.  Physical Exam: Filed Vitals:   07/04/12 1500  BP: 175/103  Pulse: 67  Temp:   Resp: 26     General appearance: somnolent post-surgery, nonverbal, in no apparent distresss Head: Normocephalic, without obvious abnormality, atraumatic Neck: no adenopathy, no carotid bruit, no JVD and supple, symmetrical, trachea midline Resp: clear to auscultation anteriorly  Cardio: regular rate and rhythm, S1, S2 normal, no murmur, click, rub or gallop GI: with abdominal binder Extremities: extremities normal, atraumatic, no cyanosis or edema Neurologic: unable to examine post-surgery Dialysis Access: AVF @ LFA with + bruit   Assessment/Plan: 1. Incarcerated umbilical hernia - laparoscopic repair with mesh per Dr. Lilyan Punt today, stable in recovery. 2. ESRD - HD on MWF @ [facility]; K 4.8 today.  Next HD tomorrow. 3. Hypertension/volume  - BP 175/103, on outpatient Diovan 320 mg qd, Hydralazine 25 mg bid, Clonidine 0.2 mg bid, Amlodipine 10 mg qd, and Carvedilol 25 mg bid; no current wt, but reached EDW at HD yesterday. 4. Anemia  - Hgb 10.5 on outpatient Epogen 1000 U, no Fe (T-sat 35% on 1/22). 5. Metabolic bone disease -  Ca 10.1, P 4.8, iPTH 236 on 1/22; on Hectorol 2 mcg, Phoslo 3 with meals. 6. Nutrition - Alb 4.1. On renal vitamin. 7. Hx  renal cell carcinoma - s/p right nephrectomy in 2002. 8. DM - diet controlled. 9. Hx gout - on Allopurinol.  Nice D 07/04/2012, 6:18 PM   Attending Nephrologist: Roney Jaffe, MD  Patient seen and examined.  Agree with assessment and plan as above. Plan HD tomorrow, stable after umbilical hernia repair. Kelly Splinter  MD [phone] pgr    [phone] cell 07/04/2012, 6:18 PM

## 2012-07-04 NOTE — Interval H&P Note (Signed)
History and Physical Interval Note:  07/04/2012 11:55 AM  [name]  has presented today for surgery, with the diagnosis of to repair defect in the abdominal wall.  The various methods of treatment have been discussed with the patient and family. After consideration of risks, benefits and other options for treatment, the patient has consented to  Procedure(s): LAPAROSCOPIC UMBILICAL HERNIA (N/A) INSERTION OF MESH (N/A) as a surgical intervention .  The patient's history has been reviewed, patient examined, no change in status, stable for surgery.  I have reviewed the patient's chart and labs.  Questions were answered to the patient's satisfaction.  Site marked.  Risks of procedure again discussed with the patient.  Risks of infection, bleeding, pain, scarring, recurrence, need for open, bowel injury, and persistent seroma or periumbilical swelling and he desires to proceed with lap umbilical hernia possible open.   [place], [facility]

## 2012-07-04 NOTE — Progress Notes (Signed)
Spoke with Dr. Lilyan Punt, Heparin will not be given.

## 2012-07-04 NOTE — Preoperative (Signed)
Beta Blockers   Reason not to administer Beta Blockers:07/04/12 0800

## 2012-07-04 NOTE — Op Note (Signed)
NAMEGLEASON, BERBER NO.:  [id number]  MEDICAL RECORD NO.:  [id number]  LOCATION:  [id number]                         FACILITY:  [facility]  PHYSICIAN:  Madilyn Hook, MD       DATE OF BIRTH:  10/22/1960  DATE OF PROCEDURE:  07/04/2012 DATE OF DISCHARGE:                              OPERATIVE REPORT   PROCEDURE:  Laparoscopic repair of incarcerated umbilical hernia with mesh.  PREOPERATIVE DIAGNOSIS:  Incarcerated umbilical hernia.  POSTOPERATIVE DIAGNOSIS:  Incarcerated umbilical hernia.  SURGEON:  Madilyn Hook, MD  ASSISTANT:  None.  ANESTHESIA:  General endotracheal anesthesia with 26 mL of 1% lidocaine with epinephrine, 0.25% Marcaine in a 50:50 mixture.  FLUIDS:  250 mL of crystalloid.  ESTIMATED BLOOD LOSS:  Minimal.  DRAINS:  None.  SPECIMENS:  None.  COMPLICATIONS:  None.  APPARENT FINDINGS:  Incarcerated omentum without any evidence of any bowel contents, 2-cm fascial defect at the umbilicus repaired with 12 cm x 12 cm piece of coated Parietex mesh.  INDICATION FOR PROCEDURE:  Mr. Zech is a 52 year old male with renal failure who has an enlarging incarcerated umbilical hernia.  It has not been very symptomatic, but it has been enlarging over time and he would like to have this repaired.  OPERATIVE DETAILS:  Mr. Kucera was seen and evaluated in the preoperative area and risks and benefits of the procedure were again discussed in lay terms.  Informed consent was obtained.  He was given prophylactic antibiotics and subcu heparin and taken to the operating room, placed on table in supine position.  The surgical site was marked prior to anesthetic administration and he was given general anesthesia and his abdomen was then prepped and draped in a standard surgical fashion.  His right arm was tucked, his left arm was left extended to minimally, so we did not have to wrap his fistula arm.  His abdomen was prepped and draped in a standard surgical  fashion.  An Ioban drape was used to minimize skin contact.  Procedure time-out was performed with all operative team members to confirm proper patient, and procedure, and a 5- mm Optiview trocar was used to access the peritoneum to the left upper quadrant.  The peritoneum was entered and pneumoperitoneum was obtained. Laparoscope was introduced and there was no evidence of bowel injury upon entry.  Another 11-mm lateral left abdominal wall trocar was placed under direct visualization, and this allowed me to reduce the hernia contents from the umbilicus.  He did have a very significant amount of omental fat, which was incarcerated up into the abdomen, but with traction, I was able to reduce this.  While reducing the fat, we had to cut some of the omentum in order to reduce this, and after I cut the omentum, I reduced the remainder of the fat which was in the hernia defect, which was actually still a fair amount of incarcerated fat. This was all reduced and placed in an EndoCatch bag and removed from the left lateral abdominal wall trocar, but not sent to Pathology.  The omentum was inspected for hemostasis, which was noted be adequate. Again, there was no evidence of any bowel  within the defect.  The defect remaining was measured about 2-2.5 cm in diameter.  I measured intra- abdominally and so I selected a 12 x 12 cm circular piece of coated Parietex mesh.  I placed 0 Gore-Tex sutures in 4 quadrants at the 12 o'clock, 3 o'clock, 6 o'clock, and 9 o'clock positions along the edge of the mesh.  The mesh was rolled up and placed through the 11-mm trocar site.  Two 5-mm right lateral abdominal trocars were also placed under direct visualization and I measured 5 cm from the edge of the hernia defect using spinal needle and used Endoclose to pull up the 4 cardinal sutures again at the 12 o'clock, 3 o'clock, 6 o'clock, and 9 o'clock positions on the mesh.  Initially, the mesh was not exactly  centered from a craniocaudad aspects, so I pulled the sutures back in the abdomen and replaced it little bit more caudad setting the center of the mesh over the center of the defect.  All of the sutures were pulled up through the abdominal wall, and after I was certain that this was the centered over the hernia defect, these sutures were secured.  Then, a secure strap absorbable tacking device was used to fix the mesh circumferentially around the periphery of the mesh, and additional 4-0 Prolene sutures were placed between each of the previous sutures using an Endoclose device.  I should mention that I placed these sutures and pulled them up while the intra-abdominal pressure was at 10 mmHg.  The mesh appeared to lie flat and covered the hernia defect centrally with the at least 5 cm overlap in all directions.  Then, the abdominal contents were inspected for any evidence of bowel injury or bleeding and none was identified.  He did have some right upper quadrant adhesions from his prior nephrectomy scar, but there did not appear to be any obvious hernia and I did not take down all these adhesions.  Then, using Endoclose device, I approximated the fascial defect at the 11-mm trocar site using 0 Vicryl figure-of-eight suture.  The suture was secured and another simple stitch was placed in this area as well to ensure adequate closure of the fascia.  The wounds were injected with a total of 26 mL of 1% lidocaine with epinephrine and 0.25% Marcaine in a 50:50 mixture and the skin edges were approximated with 4-0 Monocryl subcuticular suture.  Skin was washed and dried, and Dermabond was applied at all of the suture, instrument, trocars sites, and also the transfascial suture sites.  Sterile pressure dressing was applied at the umbilicus to minimize seroma formation given the large amount of omentum, which was incarcerated in the subcutaneous tissues and hernia sac.  All sponge, needle, and  instrument counts were correct in the case.  The patient tolerated the procedure well without apparent complication.          ______________________________ Madilyn Hook, MD     BL/MEDQ  D:  07/04/2012  T:  07/04/2012  Job:  [id number]

## 2012-07-04 NOTE — Transfer of Care (Signed)
Immediate Anesthesia Transfer of Care Note  Patient: [name]  Procedure(s) Performed: Procedure(s): LAPAROSCOPIC UMBILICAL HERNIA (N/A) INSERTION OF MESH (N/A)  Patient Location: PACU  Anesthesia Type:General  Level of Consciousness: awake, alert , oriented and patient cooperative  Airway & Oxygen Therapy: Patient Spontanous Breathing and Patient connected to face mask oxygen  Post-op Assessment: Report given to PACU RN, Post -op Vital signs reviewed and stable and Patient moving all extremities X 4  Post vital signs: Reviewed and stable  Complications: No apparent anesthesia complications

## 2012-07-04 NOTE — Brief Op Note (Signed)
07/04/2012  3:10 PM  PATIENT:  [name]  52 y.o. male  PRE-OPERATIVE DIAGNOSIS:  to repair defect in the abdominal wall.  POST-OPERATIVE DIAGNOSIS:  to repair defect in the abdominal wall  PROCEDURE:  Procedure(s): LAPAROSCOPIC UMBILICAL HERNIA (N/A) INSERTION OF MESH (N/A)  SURGEON:  Surgeon(s) and Role:    * Madilyn Hook, DO - Primary  PHYSICIAN ASSISTANT:   ASSISTANTS: none   ANESTHESIA:   general  EBL:  Total I/O In: 250 [I.V.:250] Out: 50 [Blood:50]  BLOOD ADMINISTERED:none  DRAINS: none   LOCAL MEDICATIONS USED:  MARCAINE    and LIDOCAINE   SPECIMEN:  No Specimen  DISPOSITION OF SPECIMEN:  N/A  COUNTS:  YES  TOURNIQUET:  * No tourniquets in log *  DICTATION: .Other Dictation: Dictation Number [id number]  PLAN OF CARE: Admit for overnight observation  PATIENT DISPOSITION:  PACU - hemodynamically stable.   Delay start of Pharmacological VTE agent (>24hrs) due to surgical blood loss or risk of bleeding: no

## 2012-07-04 NOTE — Progress Notes (Signed)
[facility] Renal Consultation Note  Indication for Consultation:  Management of ESRD/hemodialysis; anemia, hypertension/volume and secondary hyperparathyroidism  HPI: [name] is a 52 y.o. male with ESRD on dialysis on MWF at the [facility] who presented today for previously scheduled laparoscopic umbilical hernia repair with mesh per Dr. Madilyn Hook.  The patient has been followed by Dr. Lilyan Punt for an incarcerated umbilical hernia without evidence of strangulation, but states that it is increasing in size and feels he is ready to have it repaired.  In December he was cleared by his cardiologist, Dr. Dixie Dials, to proceed with surgery.  He was seen post-surgery in recovery and is very somnolent, but denies any pain.  Tomorrow he will have dialysis on his regularly scheduled day before his likely discharge.   Dialysis Orders: Center: [facility] on MWF.  EDW 121.5 kg  HD Bath 2K/2.25Ca  Time 4 hrs 15 mins  Heparin 12,000 U bolus, 4000 U mid-Tx. Access AVF @ LFA  BFR 450  DFR 800   Hectorol 2 mcg IV/HD  Epogen 1000 Units IV/HD  Venofer 0.   Past Medical History  Diagnosis Date  . CHF (congestive heart failure)   . History of unilateral nephrectomy   . Hypertension   . Cardiomyopathy   . Gout   . Myocardial infarction ?2006  . Shortness of breath 05/19/11    "at rest, lying down, w/exertion"  . Umbilical hernia [id number]    unrepaired  . Stroke 02/2011    05/19/11 denies residual  . Dialysis patient     Mon-Wed-Fri  . Diabetes mellitus     pt. states he's borderline diabetic., no longer taking med,- off med. since 2013  . Renal cell carcinoma     dialysis- MWF  . ESRD (end stage renal disease)   . Coronary artery disease     normal coronaries by 10/10/08 cath   Past Surgical History  Procedure Laterality Date  . Nephrectomy  2000    right  . Av fistula placement  03/29/2011    Procedure: ARTERIOVENOUS (AV) FISTULA CREATION;  Surgeon: Hinda Lenis, MD;  Location: [facility];  Service: Vascular;  Laterality: Left;  LEFT RADIOCEPHALIC , Arteriovenous [id number])  . Smashed  1990's    "left pinky; have a plate in there"  . US echocardiography  05/20/11  . Cardiac catheterization  05/21/11  . Finger surgery      L pinkie finger- ORIF- /w remaining hardware - 52's     Family History  Problem Relation Age of Onset  . Hypertension Mother   . Kidney disease Mother    Social History He denies any history of tobacco, alcohol, or illicit drug use.  Allergies  Allergen Reactions  . Ace Inhibitors Cough   Prior to Admission medications   Medication Sig Start Date End Date Taking? Authorizing Provider  amLODipine (NORVASC) 10 MG tablet Take 10 mg by mouth daily after lunch.    Yes Historical Provider, MD  aspirin 325 MG tablet Take 325 mg by mouth daily.    Yes Historical Provider, MD  b complex-vitamin c-folic acid (NEPHRO-VITE) 0.8 MG TABS Take 0.8 mg by mouth at bedtime.   Yes Historical Provider, MD  calcium acetate (PHOSLO) 667 MG capsule Take 1,334 mg by mouth 3 (three) times daily with meals.   Yes Historical Provider, MD  carvedilol (COREG) 25 MG tablet Take 0.5 tablets (12.5 mg total) by mouth 2 (two) times daily. 05/24/11  Yes Ajay  Merrilee Jansky, MD  cloNIDine (CATAPRES) 0.2 MG tablet Take 0.2 mg by mouth 3 (three) times daily.    Yes Historical Provider, MD  hydrALAZINE (APRESOLINE) 50 MG tablet Take 50 mg by mouth 2 (two) times daily.    Yes Historical Provider, MD  isosorbide dinitrate (ISORDIL) 20 MG tablet Take 20 mg by mouth 2 (two) times daily.    Yes Historical Provider, MD  oxyCODONE (OXY IR/ROXICODONE) 5 MG immediate release tablet Take 5 mg by mouth every 4 (four) hours as needed. gout pain 03/29/11  Yes Conrad Lajas, MD   Labs:  Results for orders placed during the hospital encounter of 07/04/12 (from the past 48 hour(s))  POCT I-STAT 4, (NA,K, GLUC, HGB,HCT)     Status: Abnormal   Collection Time     07/04/12 10:46 AM      Result Value Range   Sodium 139  135 - 145 mEq/L   Potassium 4.8  3.5 - 5.1 mEq/L   Glucose, Bld 95  70 - 99 mg/dL   HCT 31.0 (*) 39.0 - 52.0 %   Hemoglobin 10.5 (*) 13.0 - 17.0 g/dL  GLUCOSE, CAPILLARY     Status: None   Collection Time    07/04/12 12:14 PM      Result Value Range   Glucose-Capillary 83  70 - 99 mg/dL   Review of systems not obtained due to patient factors.  Physical Exam: Filed Vitals:   07/04/12 1019  BP: 112/72  Pulse: 63  Temp: 97.1 F (36.2 C)  Resp: 20     General appearance: somnolent post-surgery, nonverbal, in no apparent distresss Head: Normocephalic, without obvious abnormality, atraumatic Neck: no adenopathy, no carotid bruit, no JVD and supple, symmetrical, trachea midline Resp: clear to auscultation anteriorly  Cardio: regular rate and rhythm, S1, S2 normal, no murmur, click, rub or gallop GI: with abdominal binder Extremities: extremities normal, atraumatic, no cyanosis or edema Neurologic: unable to examine post-surgery Dialysis Access: AVF @ LFA with + bruit   Assessment/Plan: 1. Incarcerated umbilical hernia - laparoscopic repair with mesh per Dr. Lilyan Punt today, stable in recovery. 2. ESRD - HD on MWF @ [facility]; K 4.8 today.  Next HD tomorrow. 3. Hypertension/volume  - BP 175/103, on outpatient Diovan 320 mg qd, Hydralazine 25 mg bid, Clonidine 0.2 mg bid, Amlodipine 10 mg qd, and Carvedilol 25 mg bid; no current wt, but reached EDW at HD yesterday. 4. Anemia  - Hgb 10.5 on outpatient Epogen 1000 U, no Fe (T-sat 35% on 1/22). 5. Metabolic bone disease -  Ca 10.1, P 4.8, iPTH 236 on 1/22; on Hectorol 2 mcg, Phoslo 3 with meals. 6. Nutrition - Alb 4.1. On renal vitamin. 7. Hx renal cell carcinoma - s/p right nephrectomy in 2002. 8. DM - diet controlled. 9. Hx gout - on Allopurinol.  LYLES,CHARLES 07/04/2012, 2:48 PM   Attending Nephrologist: Roney Jaffe, MD  Patient seen and examined.  Agree with assessment and  plan as above. Plan HD tomorrow, stable after umbilical hernia repair. Kelly Splinter  MD [phone] pgr    [phone] cell 07/04/2012, 6:16 PM

## 2012-07-04 NOTE — H&P (View-Only) (Signed)
Patient ID: Berle Vollrath, male   DOB: 1960/09/07, 52 y.o.   MRN: [id number]  Chief Complaint  Patient presents with  . Re-evaluation    reck umb hernia    HPI Derez Luangrath is a 52 y.o. male.  This patient is known to me for prior evaluation of an umbilical hernia. This is currently asymptomatic but he says it has been increasing in size. He denies any obstructive symptoms from this. He says that he recently visited his cardiologist in December, Dr. Doylene Canard and he received verbal okay to proceed with surgery. He does have a history of renal failure and is on dialysis. HPI  Past Medical History  Diagnosis Date  . CHF (congestive heart failure)   . History of unilateral nephrectomy   . Hypertension   . Cardiomyopathy   . Gout   . Renal cell carcinoma   . Coronary artery disease   . Myocardial infarction ?2006  . Shortness of breath 05/19/11    "at rest, lying down, w/exertion"  . Umbilical hernia [id number]    unrepaired  . Stroke 02/2011    05/19/11 denies residual  . Dialysis patient     Mon-Wed-Fri  . Diabetes mellitus     pt. states he's borderline diabetic.  Marland Kitchen ESRD (end stage renal disease)     Past Surgical History  Procedure Date  . Nephrectomy 2000    right  . Av fistula placement 03/29/2011    Procedure: ARTERIOVENOUS (AV) FISTULA CREATION;  Surgeon: Hinda Lenis, MD;  Location: [facility];  Service: Vascular;  Laterality: Left;  LEFT RADIOCEPHALIC , Arteriovenous [id number])  . Smashed 1990's    "left pinky; have a plate in there"  . US echocardiography 05/20/11  . Cardiac catheterization 05/21/11    Family History  Problem Relation Age of Onset  . Hypertension Mother   . Kidney disease Mother     Social History History  Substance Use Topics  . Smoking status: Never Smoker   . Smokeless tobacco: Never Used  . Alcohol Use: No    Allergies  Allergen Reactions  . Ace Inhibitors Cough    Current Outpatient Prescriptions  Medication Sig Dispense  Refill  . amLODipine (NORVASC) 10 MG tablet Take 10 mg by mouth daily.       Marland Kitchen aspirin 325 MG tablet Take 325 mg by mouth daily.       Marland Kitchen b complex-vitamin c-folic acid (NEPHRO-VITE) 0.8 MG TABS Take 0.8 mg by mouth at bedtime.      . calcium acetate (PHOSLO) 667 MG capsule Take 1,334 mg by mouth 3 (three) times daily with meals.      . carvedilol (COREG) 25 MG tablet Take 0.5 tablets (12.5 mg total) by mouth 2 (two) times daily.  1 tablet  0  . cloNIDine (CATAPRES) 0.2 MG tablet Take 0.2 mg by mouth 3 (three) times daily.       . hydrALAZINE (APRESOLINE) 50 MG tablet Take 50 mg by mouth 2 (two) times daily.       . isosorbide dinitrate (ISORDIL) 20 MG tablet Take 20 mg by mouth 2 (two) times daily.       Marland Kitchen oxyCODONE (OXY IR/ROXICODONE) 5 MG immediate release tablet Take 5 mg by mouth every 4 (four) hours as needed. gout pain        Review of Systems Review of Systems All other review of systems negative or noncontributory except as stated in the HPI  Blood pressure 162/84, pulse 80, temperature 98.8 F (  37.1 C), temperature source Oral, resp. rate 16, height 6\' 1"  (1.854 m), weight 273 lb 9.6 oz (124.104 kg).  Physical Exam Physical Exam Physical Exam  Vitals reviewed. Constitutional: He is oriented to person, place, and time. He appears well-developed and well-nourished. No distress.  HENT:  Head: Normocephalic and atraumatic.  Mouth/Throat: No oropharyngeal exudate.  Eyes: Conjunctivae and EOM are normal. Pupils are equal, round, and reactive to light. Right eye exhibits no discharge. Left eye exhibits no discharge. No scleral icterus.  Neck: Normal range of motion. No tracheal deviation present.  Cardiovascular: Normal rate, regular rhythm and normal heart sounds.   Pulmonary/Chest: Effort normal and breath sounds normal. No stridor. No respiratory distress. He has no wheezes. He has no rales. He exhibits no tenderness.  Abdominal: Soft. Bowel sounds are normal. He exhibits no  distension and no mass. There is no tenderness. There is no rebound and no guarding. He has a RUQ scar.  Also with an incarcerated, but nontender umbilical hernia. Musculoskeletal: Normal range of motion. He exhibits no edema and no tenderness.  Neurological: He is alert and oriented to person, place, and time.  Skin: Skin is warm and dry. No rash noted. He is not diaphoretic. No erythema. No pallor.  Psychiatric: He has a normal mood and affect. His behavior is normal. Judgment and thought content normal.    Data Reviewed   Assessment    Umbilical hernia. This patient is incarcerated umbilical hernia without evidence of strangulation. He says that has been increasing in size and he would like to have this repaired. I think that laparoscopic repair would be best for him given his obesity and renal failure. We discussed the pros and cons and the risks and benefits of the open and laparoscopic repair and he agrees with laparoscopic umbilical hernia repair with mesh. We discussed the risks of the procedure including infection, bleeding, pain, scarring, recurrence, seroma and persistent bulge, bowel injury and the need for open surgery and he expressed understanding and desires to proceed with laparoscopic umbilical hernia repair with mesh    Plan     we will set him up for a laparoscopic umbilical hernia repairwith mesh after cardiac clearance has been received. We will also try and set this up for a Tuesday so we can coordinate this with his dialysis while inpatient.       Morrison, [place] 06/20/2012, 9:24 AM

## 2012-07-04 NOTE — Anesthesia Preprocedure Evaluation (Addendum)
Anesthesia Evaluation  Patient identified by MRN, date of birth, ID band Patient awake    Reviewed: Allergy & Precautions, H&P , NPO status , Patient's Chart, lab work & pertinent test results, reviewed documented beta blocker date and time   History of Anesthesia Complications Negative for: history of anesthetic complications  Airway Mallampati: II TM Distance: >3 FB Neck ROM: Full    Dental no notable dental hx. (+) Teeth Intact and Dental Advisory Given   Pulmonary shortness of breath, with exertion, at rest and lying,  breath sounds clear to auscultation  Pulmonary exam normal       Cardiovascular hypertension, Pt. on medications and Pt. on home beta blockers + CAD, + Past MI and +CHF Rhythm:regular Rate:Normal  Study Conclusions  - Left ventricle: The cavity size was severely dilated.   There was moderate concentric hypertrophy. Systolic   function was severely reduced. The estimated ejection   fraction was in the range of 25% to 30%. Diffuse   hypokinesis. - Mitral valve: Moderate regurgitation. - Left atrium: The atrium was severely dilated. - Right ventricle: The cavity size was normal. Wall   thickness was mildly increased.    Neuro/Psych CVA, No Residual Symptoms    GI/Hepatic negative GI ROS, Umbilical hernia   Endo/Other  diabetes, Type 2Morbid obesity  Renal/GU ESRFRenal disease (MWF HD)     Musculoskeletal   Abdominal (+) + obese,   Peds  Hematology   Anesthesia Other Findings  CHF (congestive heart failure)     History of unilateral nephrectomy        Hypertension     Cardiomyopathy        Gout     Myocardial infarction ?2006      Shortness of breath 05/19/11 "at rest, lying down, w/exertion" Umbilical hernia [id number] unrepaired    Stroke 02/2011 05/19/11 denies residual Dialysis patient   Mon-Wed-Fri    Diabetes mellitus   pt. states he's borderline diabetic., no longer taking med,- off med.  since 2013 Renal cell carcinoma   dialysis- MWF    ESRD (end stage renal disease)     Coronary artery disease   normal coronaries by 10/10/08 cath    Reproductive/Obstetrics                        Anesthesia Physical Anesthesia Plan  ASA: III  Anesthesia Plan: General ETT   Post-op Pain Management:    Induction:   Airway Management Planned:   Additional Equipment: Arterial line  Intra-op Plan:   Post-operative Plan:   Informed Consent: I have reviewed the patients History and Physical, chart, labs and discussed the procedure including the risks, benefits and alternatives for the proposed anesthesia with the patient or authorized representative who has indicated his/her understanding and acceptance.   Dental Advisory Given  Plan Discussed with: CRNA and Surgeon  Anesthesia Plan Comments:         Anesthesia Quick Evaluation

## 2012-07-05 ENCOUNTER — Inpatient Hospital Stay (HOSPITAL_COMMUNITY): Payer: BC Managed Care – PPO

## 2012-07-05 LAB — RENAL FUNCTION PANEL
Albumin: 3.5 g/dL (ref 3.5–5.2)
BUN: 48 mg/dL — ABNORMAL HIGH (ref 6–23)
CO2: 25 mEq/L (ref 19–32)
Calcium: 9.7 mg/dL (ref 8.4–10.5)
Chloride: 99 mEq/L (ref 96–112)
Creatinine, Ser: 9.49 mg/dL — ABNORMAL HIGH (ref 0.50–1.35)
GFR calc Af Amer: 7 mL/min — ABNORMAL LOW (ref >90)
GFR calc non Af Amer: 6 mL/min — ABNORMAL LOW (ref >90)
Glucose, Bld: 87 mg/dL (ref 70–99)
Phosphorus: 5.6 mg/dL — ABNORMAL HIGH (ref 2.3–4.6)
Potassium: 5 mEq/L (ref 3.5–5.1)
Sodium: 138 mEq/L (ref 135–145)

## 2012-07-05 LAB — CBC
HCT: 31.9 % — ABNORMAL LOW (ref 39.0–52.0)
Hemoglobin: 10.7 g/dL — ABNORMAL LOW (ref 13.0–17.0)
MCH: 31.3 pg (ref 26.0–34.0)
MCHC: 33.5 g/dL (ref 30.0–36.0)
MCV: 93.3 fL (ref 78.0–100.0)
Platelets: 152 10*3/uL (ref 150–400)
RBC: 3.42 MIL/uL — ABNORMAL LOW (ref 4.22–5.81)
RDW: 14.1 % (ref 11.5–15.5)
WBC: 5.5 10*3/uL (ref 4.0–10.5)

## 2012-07-05 LAB — MAGNESIUM: Magnesium: 2.2 mg/dL (ref 1.5–2.5)

## 2012-07-05 LAB — PHOSPHORUS: Phosphorus: 5.6 mg/dL — ABNORMAL HIGH (ref 2.3–4.6)

## 2012-07-05 MED ORDER — DIPHENHYDRAMINE HCL 25 MG PO CAPS: 25.0000 mg | ORAL_CAPSULE | Freq: Three times a day (TID) | ORAL | Status: DC | PRN

## 2012-07-05 MED ORDER — DARBEPOETIN ALFA-POLYSORBATE 40 MCG/0.4ML IJ SOLN
INTRAMUSCULAR | Status: AC
  Administered 2012-07-05: 40 ug via INTRAVENOUS
  Filled 2012-07-05: qty 0.4

## 2012-07-05 MED ORDER — DOXERCALCIFEROL 4 MCG/2ML IV SOLN
INTRAVENOUS | Status: AC
  Administered 2012-07-05: 2 ug via INTRAVENOUS
  Filled 2012-07-05: qty 2

## 2012-07-05 NOTE — Progress Notes (Signed)
Subjective:  No current complaints, comfortable on dialysis.  Objective: Vital signs in last 24 hours: Temp:  [96.8 F (36 C)-98.2 F (36.8 C)] 98.2 F (36.8 C) (02/12 0727) Pulse Rate:  [63-82] 82 (02/12 0900) Resp:  [20-26] 20 (02/12 0800) BP: (112-180)/(72-103) 154/94 mmHg (02/12 0900) SpO2:  [95 %-100 %] 97 % (02/12 0727) Weight:  [123.1 kg (271 lb 6.2 oz)] 123.1 kg (271 lb 6.2 oz) (02/12 0727) Weight change:   Intake/Output from previous day: 02/11 0701 - 02/12 0700 In: 540 [P.O.:240; I.V.:250; IV Piggyback:50] Out: 50 [Blood:50]   EXAM: General appearance:  Alert, in no apparent distress Resp:  CTA without rales, rhonchi, or wheezes Cardio:  RRR without murmur or rub GI:  + BS, soft and nontender Extremities:  No edema Access:  AVF @ LFA with BFR 450 cc/min  Lab Results:  Recent Labs  07/04/12 1046 07/05/12 0801  WBC  --  5.5  HGB 10.5* 10.7*  HCT 31.0* 31.9*  PLT  --  152   BMET:  Recent Labs  07/04/12 1046 07/05/12 0800  NA 139 138  K 4.8 5.0  CL  --  99  CO2  --  25  GLUCOSE 95 87  BUN  --  48*  CREATININE  --  9.49*  CALCIUM  --  9.7  ALBUMIN  --  3.5   No results found for this basename: PTH,  in the last 72 hours Iron Studies: No results found for this basename: IRON, TIBC, TRANSFERRIN, FERRITIN,  in the last 72 hours  Dialysis Orders: Center: [place] on MWF. EDW 121.5 kg HD Bath 2K/2.25Ca Time 4 hrs 15 mins Heparin 12,000 U bolus, 4000 U mid-Tx. Access AVF @ LFA BFR 450 DFR 800 Hectorol 2 mcg IV/HD Epogen 1000 Units IV/HD Venofer 0.   Assessment/Plan: 1. Incarcerated umbilical hernia - laparoscopic repair with mesh per Dr. Lilyan Punt yesterday. 2. ESRD - HD on MWF @ [place]; K 5 pre-HD today. 3. Hypertension/volume - BP 152/94, on outpatient Diovan 320 mg qd, Hydralazine 50 mg bid, Clonidine 0.2 mg bid, Amlodipine 10 mg qd, and Carvedilol 12.5 mg bid; pre-HD wt 123.1 kg with EDW 121.5.  UF goal of 4 L. 4. Anemia - Hgb 10.5 on outpatient Epogen 1000  U, no Fe (T-sat 35% on 1/22). 5. Metabolic bone disease - Ca 9.7 (10.1 corrected), P 45.6, iPTH 236 on 1/22; on Hectorol 2 mcg, Phoslo 3 with meals. 6. Nutrition - Alb 3.5. On renal vitamin. 7. Hx renal cell carcinoma - s/p right nephrectomy in 2002. 8. DM - diet controlled. 9. Hx gout - on Allopurinol.     LOS: 1 day   LYLES,CHARLES 07/05/2012,9:21 AM  Patient seen and examined.  Agree with assessment and plan as above.  Kelly Splinter  MD [phone] pgr    [phone] cell 07/05/2012, 12:20 PM

## 2012-07-05 NOTE — Progress Notes (Signed)
Utilization review completed.  

## 2012-07-05 NOTE — Progress Notes (Addendum)
Patient ID: [name], male   DOB: 28-Feb-1961, 52 y.o.   MRN: [id number] Still on dialysis.  He appears to be resting comfortably.  BP better.  He is HD stable. He has a cough which has been present before his surgery.  He says that he has some back pain with deep breath.  He is breathing comfortably and denies chest pain.  He is on DVT prophylaxis and has SCD's as well.  No evidence of any significant cause such as PE or MI.  Will check cxr and he would like to stay another day for pain control.  He is on tele and monitor looks okay.  Will check the cxr and continue to follow.

## 2012-07-05 NOTE — Anesthesia Postprocedure Evaluation (Signed)
  Anesthesia Post Note  Patient: [name]  Procedure(s) Performed: Procedure(s) (LRB): LAPAROSCOPIC UMBILICAL HERNIA (N/A) INSERTION OF MESH (N/A)  Anesthesia type: GA  Patient location: PACU  Post pain: Pain level controlled  Post assessment: Post-op Vital signs reviewed  Last Vitals:  Filed Vitals:   07/05/12 1030  BP:   Pulse: 81  Temp:   Resp: 20    Post vital signs: Reviewed  Level of consciousness: sedated  Complications: No apparent anesthesia complications

## 2012-07-05 NOTE — Progress Notes (Signed)
1 Day Post-Op  Subjective: Complains of abdominal and back pain.    Objective: Vital signs in last 24 hours: Temp:  [96.8 F (36 C)-98.2 F (36.8 C)] 98.2 F (36.8 C) (02/12 0727) Pulse Rate:  [63-81] 80 (02/12 0830) Resp:  [20-26] 20 (02/12 0800) BP: (112-180)/(72-103) 174/96 mmHg (02/12 0830) SpO2:  [95 %-100 %] 97 % (02/12 0727) Weight:  [271 lb 6.2 oz (123.1 kg)] 271 lb 6.2 oz (123.1 kg) (02/12 0727) Last BM Date: 07/04/12  Intake/Output from previous day: 02/11 0701 - 02/12 0700 In: 540 [P.O.:240; I.V.:250; IV Piggyback:50] Out: 26 [Blood:50] Intake/Output this shift:    General appearance: alert, cooperative and no distress Resp: nonlabored Cardio: normal rate, regular GI: soft, appropriate tenderness, ND, incisions without infection, no peritoneal signs  Lab Results:   Recent Labs  07/04/12 1046 07/05/12 0801  WBC  --  5.5  HGB 10.5* 10.7*  HCT 31.0* 31.9*  PLT  --  152   BMET  Recent Labs  07/04/12 1046  NA 139  K 4.8  GLUCOSE 95   PT/INR No results found for this basename: LABPROT, INR,  in the last 72 hours ABG No results found for this basename: PHART, PCO2, PO2, HCO3,  in the last 72 hours  Studies/Results: No results found.  Anti-infectives: Anti-infectives   Start     Dose/Rate Route Frequency Ordered Stop   07/04/12 2200  ceFAZolin (ANCEF) IVPB 1 g/50 mL premix     1 g 100 mL/hr over 30 Minutes Intravenous 3 times per day 07/04/12 1759 07/05/12 2159   07/04/12 0600  ceFAZolin (ANCEF) IVPB 2 g/50 mL premix     2 g 100 mL/hr over 30 Minutes Intravenous On call to O.R. 07/03/12 1400 07/04/12 1245      Assessment/Plan: s/p Procedure(s): LAPAROSCOPIC UMBILICAL HERNIA (N/A) INSERTION OF MESH (N/A) He looks okay and his abdomen is appropriate but he is still having some significant abdominal tenderness, will probably need another day to achieve better pain control.  Hopefully will get better BP control as his regular medications are  restarted.  LOS: 1 day    Madilyn Hook DAVID 07/05/2012

## 2012-07-06 ENCOUNTER — Encounter (HOSPITAL_COMMUNITY): Payer: Self-pay | Admitting: General Surgery

## 2012-07-06 DIAGNOSIS — R5381 Other malaise: Secondary | ICD-10-CM

## 2012-07-06 MED ORDER — SODIUM CHLORIDE 0.9 % IV BOLUS (SEPSIS)
1000.0000 mL | Freq: Once | INTRAVENOUS | Status: AC
  Administered 2012-07-06: 1000 mL via INTRAVENOUS

## 2012-07-06 NOTE — Progress Notes (Signed)
Subjective:  Improving pain, currently comfortable, but did not feel well during dialysis yesterday, states that too much fluid was removed.  Objective: Vital signs in last 24 hours: Temp:  [98.3 F (36.8 C)-100 F (37.8 C)] 99.3 F (37.4 C) (02/13 0535) Pulse Rate:  [80-97] 89 (02/13 0535) Resp:  [18-20] 20 (02/13 0535) BP: (121-174)/(65-96) 121/76 mmHg (02/13 0535) SpO2:  [92 %-97 %] 92 % (02/13 0535) Weight change:   Intake/Output from previous day: 02/12 0701 - 02/13 0700 In: 480 [P.O.:480] Out: 3725    EXAM: General appearance:  Alert, in no apparent distress Resp:  CTA without rales, rhonchi, or wheezes Cardio:  RRR without murmur or rub GI:  + BS, soft, mild tenderness Extremities:  No edema Access:  AVF @ LFA with + bruit  Lab Results:  Recent Labs  07/04/12 1046 07/05/12 0801  WBC  --  5.5  HGB 10.5* 10.7*  HCT 31.0* 31.9*  PLT  --  152   BMET:  Recent Labs  07/04/12 1046 07/05/12 0800  NA 139 138  K 4.8 5.0  CL  --  99  CO2  --  25  GLUCOSE 95 87  BUN  --  48*  CREATININE  --  9.49*  CALCIUM  --  9.7  ALBUMIN  --  3.5   No results found for this basename: PTH,  in the last 72 hours Iron Studies: No results found for this basename: IRON, TIBC, TRANSFERRIN, FERRITIN,  in the last 72 hours  Dialysis Orders: Center: [place] on MWF. EDW 121.5 kg HD Bath 2K/2.25Ca Time 4 hrs 15 mins Heparin 12,000 U bolus, 4000 U mid-Tx. Access AVF @ LFA BFR 450 DFR 800 Hectorol 2 mcg IV/HD Epogen 1000 Units IV/HD Venofer 0.   Assessment/Plan: 1. Incarcerated umbilical hernia - laparoscopic repair with mesh per Dr. Lilyan Punt 2/11. 2. ESRD - HD on MWF @ [place]; K 5 pre-HD yesterday.  Next HD tomorrow. Keep even 3. Debility- due to surgery but also vol depleted from excess UF at HD yest. Will bolus NS, then check standing BP/P, rebolus prn. Is 3-4 below dry wt today. 4. Hypertension/volume - BP 121/76, on Hydralazine 50 mg bid, Clonidine 0.2 mg bid, Amlodipine 10 mg qd, and  Carvedilol 12.5 mg bid; pre-HD wt 123.1 kg, s/p net UF 3.7 L yesterday,  EDW 121.5.  Maintain current EDW. 5. Anemia - Hgb 10.7 on outpatient Epogen 1000 U, no Fe (T-sat 35% on 1/22).  Aranesp 40 mcg yesterday. 6. Metabolic bone disease - Ca 9.7 (10.1 corrected), P 5.6, last iPTH 236 on 1/22; on Hectorol 2 mcg, Phoslo 3 with meals. 7. Nutrition - Alb 3.5. On renal vitamin. 8. Hx renal cell carcinoma - s/p right nephrectomy in 2002. 9. DM - diet controlled. 10. Hx gout - on Allopurinol.     LOS: 2 days   LYLES,CHARLES 07/06/2012,8:01 AM  Patient seen and examined.  I agree with plan as above with additions as indicated. Kelly Splinter  MD [phone] pgr    [phone] cell 07/06/2012, 9:25 AM

## 2012-07-06 NOTE — Evaluation (Signed)
Physical Therapy Evaluation Patient Details Name: [name] MRN: [id number] DOB: 1960/06/24 Today's Date: 07/06/2012 Time: 1142-1209 PT Time Calculation (min): 27 min  PT Assessment / Plan / Recommendation Clinical Impression  Pt s/p umbilical hernia repair.  Pt also on HD.  Pt moving slowly due to pain.  Needs PT to maximize I and safety so pt can return home with wife.    PT Assessment  Patient needs continued PT services    Follow Up Recommendations  No PT follow up    Does the patient have the potential to tolerate intense rehabilitation      Barriers to Discharge        Equipment Recommendations  None recommended by PT    Recommendations for Other Services     Frequency Min 3X/week    Precautions / Restrictions Precautions Precautions: Fall   Pertinent Vitals/Pain See flow sheet. Not orthostatic.      Mobility  Bed Mobility Bed Mobility: Supine to Sit;Sitting - Scoot to [employer] of Bed;Sit to Supine Supine to Sit: 4: Min assist;HOB elevated Sitting - Scoot to [employer] of Bed: 5: Supervision Sit to Supine: 4: Min assist;HOB elevated Details for Bed Mobility Assistance: Assist to bring trunk up to sit ang legs up to supine Transfers Transfers: Sit to Stand;Stand to Sit Sit to Stand: 4: Min assist;With upper extremity assist;From bed Stand to Sit: 4: Min assist;With upper extremity assist;To bed Ambulation/Gait Ambulation/Gait Assistance: 5: Supervision Ambulation Distance (Feet): 24 Feet Assistive device: Rolling walker Ambulation/Gait Assistance Details: Slow but steady Gait Pattern: Step-through pattern;Decreased step length - right;Decreased step length - left Gait velocity: very slow    Exercises     PT Diagnosis: Difficulty walking;Acute pain  PT Problem List: Decreased activity tolerance;Decreased mobility;Pain PT Treatment Interventions: DME instruction;Gait training;Functional mobility training;Therapeutic activities;Therapeutic exercise;Patient/family  education   PT Goals Acute Rehab PT Goals PT Goal Formulation: With patient Time For Goal Achievement: 07/13/12 Potential to Achieve Goals: Good Pt will go Supine/Side to Sit: with supervision PT Goal: Supine/Side to Sit - Progress: Goal set today Pt will go Sit to Supine/Side: with supervision PT Goal: Sit to Supine/Side - Progress: Goal set today Pt will go Sit to Stand: with supervision PT Goal: Sit to Stand - Progress: Goal set today Pt will go Stand to Sit: with supervision PT Goal: Stand to Sit - Progress: Goal set today Pt will Ambulate: 51 - 150 feet;with supervision;with least restrictive assistive device PT Goal: Ambulate - Progress: Goal set today  Visit Information  Last PT Received On: 07/06/12 Assistance Needed: +1    Subjective Data  Subjective: Pt states he feels light-headed even in supine but this didn't worsen with mobility. Patient Stated Goal: return home   Prior [place] Lives With: Spouse Available Help at Discharge: Family Type of Home: House Home Access: Stairs to enter Technical brewer of Steps: 1 Home Layout: One level [place]: Walker - rolling Prior Function Level of Independence: Independent Vocation: On disability Communication Communication: No difficulties    Cognition  Cognition Overall Cognitive Status: Appears within functional limits for tasks assessed/performed Arousal/Alertness: Awake/alert Orientation Level: Appears intact for tasks assessed Behavior During Session: [facility] for tasks performed    Extremity/Trunk Assessment Right Lower Extremity Assessment RLE ROM/Strength/Tone: [facility] for tasks assessed Left Lower Extremity Assessment LLE ROM/Strength/Tone: [facility] for tasks assessed   Balance Balance Balance Assessed: Yes Static Standing Balance Static Standing - Balance Support: Bilateral upper extremity supported Static Standing - Level of Assistance: 5:  Stand by assistance Static Standing -  Comment/# of Minutes: stood 3-4 minutes  End of Session PT - End of Session Activity Tolerance: Patient tolerated treatment well Patient left: in bed Nurse Communication: Mobility status  GP     [facility] 07/06/2012, 12:26 PM  [facility] PT [phone]

## 2012-07-06 NOTE — Progress Notes (Signed)
2 Days Post-Op  Subjective: Standing up at scale with RN but feeling quite woozy, no BM yet, good pain control  Objective: Vital signs in last 24 hours: Temp:  [98.3 F (36.8 C)-100 F (37.8 C)] 99.3 F (37.4 C) (02/13 0535) Pulse Rate:  [80-97] 89 (02/13 0535) Resp:  [18-20] 20 (02/13 0535) BP: (121-154)/(65-96) 121/76 mmHg (02/13 0535) SpO2:  [92 %-97 %] 92 % (02/13 0535) Last BM Date: 07/04/12  Intake/Output from previous day: 02/12 0701 - 02/13 0700 In: 480 [P.O.:480] Out: 3725  Intake/Output this shift:    General appearance: alert Resp: clear to auscultation bilaterally GI: soft, dressing CDI, NT, +BS, binder replaced Unsteady standing  Lab Results:   Recent Labs  07/04/12 1046 07/05/12 0801  WBC  --  5.5  HGB 10.5* 10.7*  HCT 31.0* 31.9*  PLT  --  152   BMET  Recent Labs  07/04/12 1046 07/05/12 0800  NA 139 138  K 4.8 5.0  CL  --  99  CO2  --  25  GLUCOSE 95 87  BUN  --  48*  CREATININE  --  9.49*  CALCIUM  --  9.7   PT/INR No results found for this basename: LABPROT, INR,  in the last 72 hours ABG No results found for this basename: PHART, PCO2, PO2, HCO3,  in the last 72 hours  Studies/Results: Dg Chest 2 View  07/05/2012  *RADIOLOGY REPORT*  Clinical Data: Cough following abdominal hernia surgery yesterday. Pain.  CHEST - 2 VIEW  Comparison: Two-view chest 06/29/2012.  Findings: Cardiac enlargement is exaggerate by low lung volumes. Bibasilar atelectasis is worse than the prior study.  Small pleural effusions are present, worse on the left.  IMPRESSION:  1.  Cardiomegaly. 2.  Bibasilar atelectasis and pleural effusions, left greater than right.   Original Report Authenticated By: San Morelle, M.D.     Anti-infectives: Anti-infectives   Start     Dose/Rate Route Frequency Ordered Stop   07/04/12 2200  ceFAZolin (ANCEF) IVPB 1 g/50 mL premix     1 g 100 mL/hr over 30 Minutes Intravenous 3 times per day 07/04/12 1759 07/05/12 1802   07/04/12 0600  ceFAZolin (ANCEF) IVPB 2 g/50 mL premix     2 g 100 mL/hr over 30 Minutes Intravenous On call to O.R. 07/03/12 1400 07/04/12 1245      Assessment/Plan: s/p Procedure(s): LAPAROSCOPIC UMBILICAL HERNIA (N/A) INSERTION OF MESH (N/A) POD#2 CRF - volume status and HD per renal service, appreciate their management Deconditioning - will have PT eval Dispo - hope for D/C after HD tomorrow  LOS: 2 days    Sashia Campas E 07/06/2012

## 2012-07-07 ENCOUNTER — Encounter (HOSPITAL_COMMUNITY): Payer: Self-pay | Admitting: *Deleted

## 2012-07-07 LAB — RENAL FUNCTION PANEL
Albumin: 3.3 g/dL — ABNORMAL LOW (ref 3.5–5.2)
BUN: 56 mg/dL — ABNORMAL HIGH (ref 6–23)
CO2: 27 mEq/L (ref 19–32)
Calcium: 10.2 mg/dL (ref 8.4–10.5)
Chloride: 92 mEq/L — ABNORMAL LOW (ref 96–112)
Creatinine, Ser: 12.22 mg/dL — ABNORMAL HIGH (ref 0.50–1.35)
GFR calc Af Amer: 5 mL/min — ABNORMAL LOW (ref >90)
GFR calc non Af Amer: 4 mL/min — ABNORMAL LOW (ref >90)
Glucose, Bld: 103 mg/dL — ABNORMAL HIGH (ref 70–99)
Phosphorus: 5.1 mg/dL — ABNORMAL HIGH (ref 2.3–4.6)
Potassium: 4.6 mEq/L (ref 3.5–5.1)
Sodium: 133 mEq/L — ABNORMAL LOW (ref 135–145)

## 2012-07-07 LAB — CBC
HCT: 31.5 % — ABNORMAL LOW (ref 39.0–52.0)
Hemoglobin: 10.6 g/dL — ABNORMAL LOW (ref 13.0–17.0)
MCH: 30.9 pg (ref 26.0–34.0)
MCHC: 33.7 g/dL (ref 30.0–36.0)
MCV: 91.8 fL (ref 78.0–100.0)
Platelets: 156 10*3/uL (ref 150–400)
RBC: 3.43 MIL/uL — ABNORMAL LOW (ref 4.22–5.81)
RDW: 13.7 % (ref 11.5–15.5)
WBC: 8.4 10*3/uL (ref 4.0–10.5)

## 2012-07-07 MED ORDER — SODIUM CHLORIDE 0.9 % IV SOLN: 100.0000 mL | INTRAVENOUS | Status: DC | PRN

## 2012-07-07 MED ORDER — LIDOCAINE-PRILOCAINE 2.5-2.5 % EX CREA: 1.0000 "application " | TOPICAL_CREAM | CUTANEOUS | Status: DC | PRN

## 2012-07-07 MED ORDER — ALTEPLASE 2 MG IJ SOLR: 2.0000 mg | Freq: Once | INTRAMUSCULAR | Status: DC | PRN

## 2012-07-07 MED ORDER — DOXERCALCIFEROL 4 MCG/2ML IV SOLN
INTRAVENOUS | Status: AC
  Filled 2012-07-07: qty 2

## 2012-07-07 MED ORDER — OXYCODONE HCL 5 MG PO TABS: 5.0000 mg | ORAL_TABLET | ORAL | Status: DC | PRN

## 2012-07-07 MED ORDER — LIDOCAINE HCL (PF) 1 % IJ SOLN: 5.0000 mL | INTRAMUSCULAR | Status: DC | PRN

## 2012-07-07 MED ORDER — HEPARIN SODIUM (PORCINE) 1000 UNIT/ML DIALYSIS: 1000.0000 [IU] | INTRAMUSCULAR | Status: DC | PRN

## 2012-07-07 MED ORDER — NEPRO/CARBSTEADY PO LIQD: 237.0000 mL | ORAL | Status: DC | PRN

## 2012-07-07 MED ORDER — PENTAFLUOROPROP-TETRAFLUOROETH EX AERO: 1.0000 "application " | INHALATION_SPRAY | CUTANEOUS | Status: DC | PRN

## 2012-07-07 NOTE — Progress Notes (Signed)
PT Cancellation Note  Patient Details Name: [name] MRN: [id number] DOB: 28-Oct-1960   Cancelled Treatment:    Reason Eval/Treat Not Completed: Patient at procedure or test/unavailable (pt at HD).   [name] 07/07/2012, 1:47 PM

## 2012-07-07 NOTE — Progress Notes (Signed)
[facility] Progress Note  Subjective:   They took too much fluid off during my last dialysis..  Objective Filed Vitals:   07/06/12 2320 07/07/12 0210 07/07/12 0558 07/07/12 0605  BP: 100/74 101/69  108/67  Pulse: 73 71  71  Temp:    98.5 F (36.9 C)  TempSrc:    Oral  Resp: 18 16  18   Height:   6\' 1"  (1.854 m)   Weight:      SpO2: 91% 96%  98%   Physical Exam General: NAD Heart: RRR Lungs: no wheezes or rales Abdomen: corset in place Extremities: no LE edmea Dialysis Access: left upper AVF + bruit  Dialysis Orders: Center: [place] on MWF. EDW 121.5 kg HD Bath 2K/2.25Ca Time 4 hrs 15 mins Heparin 12,000 U bolus, 4000 U mid-Tx. Access AVF @ LFA BFR 450 DFR 800 Hectorol 2 mcg IV/HD Epogen 1000 Units IV/HD Venofer 0.   Assessment/Plan:  1. Incarcerated umbilical hernia - laparoscopic repair with mesh per Dr. Lilyan Punt 2/11- pt thinks he may go home Sat. 2. ESRD - HD on MWF @ [place];  HD today 3. Debility- due to surgery but also vol depleted from excess UF at HD yest.IV 460 cc + 1300 po the past 24 hours below dry wt today. 4. Hypertension/volume - , on Hydralazine 50 mg bid, Clonidine 0.2 mg bid, Amlodipine 10 mg qd, and Carvedilol 12.5 mg bid; pre-HD wt 123.1 kg, s/p net UF 3.7 L Wed. Would think we could decrease meds or d/c.  Has been getting all along except one dose held. None today pre HD. Defer to Dr. Jonnie Finner regarding change. Will stop clonidine and hydralazine for now (keep, don't throw away) and continue Norvasc and Coreg 5. Anemia - Hgb 10.7 on outpatient Epogen 1000 U, no Fe (T-sat 35% on 1/22). Aranesp 40 mcg yesterday. 6. Metabolic bone disease - Ca 9.7 (10.1 corrected), P 5.6, last iPTH 236 on 1/22; on Hectorol 2 mcg, Phoslo 3 with meals. 7. Nutrition - Alb 3.5. On renal vitamin. 8. Hx renal cell carcinoma - s/p right nephrectomy in 2002. 9. DM - diet controlled. 10. Hx gout - on Allopurinol.  Myriam Jacobson, PA-C [place] 07/07/2012,9:54 AM  LOS: 3 days   Patient seen and examined.  I agree with plan as above with additions as indicated. Kelly Splinter  MD [phone] pgr    [phone] cell 07/07/2012, 11:17 AM   Additional Objective Labs: Basic Metabolic Panel:  Recent Labs Lab 07/04/12 1046 07/05/12 0800  NA 139 138  K 4.8 5.0  CL  --  99  CO2  --  25  GLUCOSE 95 87  BUN  --  48*  CREATININE  --  9.49*  CALCIUM  --  9.7  PHOS  --  5.6*  5.6*   Liver Function Tests:  Recent Labs Lab 07/05/12 0800  ALBUMIN 3.5   CBC:  Recent Labs Lab 07/04/12 1046 07/05/12 0801  WBC  --  5.5  HGB 10.5* 10.7*  HCT 31.0* 31.9*  MCV  --  93.3  PLT  --  152   BCBG:  Recent Labs Lab 07/04/12 1214 07/04/12 1512  GLUCAP 83 112*  Studies/Results: Dg Chest 2 View  07/05/2012  *RADIOLOGY REPORT*  Clinical Data: Cough following abdominal hernia surgery yesterday. Pain.  CHEST - 2 VIEW  Comparison: Two-view chest 06/29/2012.  Findings: Cardiac enlargement is exaggerate by low lung volumes. Bibasilar atelectasis is worse than the prior  study.  Small pleural effusions are present, worse on the left.  IMPRESSION:  1.  Cardiomegaly. 2.  Bibasilar atelectasis and pleural effusions, left greater than right.   Original Report Authenticated By: San Morelle, M.D.    Medications:   . amLODipine  10 mg Oral QPC lunch  . aspirin  325 mg Oral Daily  . calcium acetate  1,334 mg Oral TID WC  . carvedilol  12.5 mg Oral BID WC  . cloNIDine  0.2 mg Oral TID  . darbepoetin (ARANESP) injection - DIALYSIS  40 mcg Intravenous Q Wed-HD  . doxercalciferol  2 mcg Intravenous Q M,W,F-HD  . heparin subcutaneous  5,000 Units Subcutaneous Q8H  . hydrALAZINE  50 mg Oral BID  . isosorbide dinitrate  20 mg Oral BID  . multivitamin  1 tablet Oral QHS

## 2012-07-07 NOTE — Procedures (Signed)
I was present at this dialysis session. I have reviewed the session itself and made appropriate changes.   Kelly Splinter, MD [employer] 07/07/2012, 4:36 PM

## 2012-07-07 NOTE — Progress Notes (Signed)
3 Days Post-Op  Subjective: Feeling better but still with poor appetite.  Pain improving. Not that mobile.  Objective: Vital signs in last 24 hours: Temp:  [98 F (36.7 C)-99.5 F (37.5 C)] 98.5 F (36.9 C) (02/14 0605) Pulse Rate:  [71-81] 71 (02/14 0605) Resp:  [16-18] 18 (02/14 0605) BP: (100-124)/(67-80) 108/67 mmHg (02/14 0605) SpO2:  [90 %-98 %] 98 % (02/14 0605) Weight:  [265 lb 10.5 oz (120.5 kg)-266 lb 12.1 oz (121 kg)] 266 lb 12.1 oz (121 kg) (02/13 2104) Last BM Date: 07/04/12  Intake/Output from previous day: 02/13 0701 - 02/14 0700 In: 1340 [P.O.:1320; I.V.:20] Out: -  Intake/Output this shift:    General appearance: alert, cooperative and no distress Resp: nonlabored Cardio: normal rate, regular rhythm GI: soft, appropriate incisional tenderness, ND, wounds without infection, no peritoneal signs. binder in place  Lab Results:   Recent Labs  07/04/12 1046 07/05/12 0801  WBC  --  5.5  HGB 10.5* 10.7*  HCT 31.0* 31.9*  PLT  --  152   BMET  Recent Labs  07/04/12 1046 07/05/12 0800  NA 139 138  K 4.8 5.0  CL  --  99  CO2  --  25  GLUCOSE 95 87  BUN  --  48*  CREATININE  --  9.49*  CALCIUM  --  9.7   PT/INR No results found for this basename: LABPROT, INR,  in the last 72 hours ABG No results found for this basename: PHART, PCO2, PO2, HCO3,  in the last 72 hours  Studies/Results: Dg Chest 2 View  07/05/2012  *RADIOLOGY REPORT*  Clinical Data: Cough following abdominal hernia surgery yesterday. Pain.  CHEST - 2 VIEW  Comparison: Two-view chest 06/29/2012.  Findings: Cardiac enlargement is exaggerate by low lung volumes. Bibasilar atelectasis is worse than the prior study.  Small pleural effusions are present, worse on the left.  IMPRESSION:  1.  Cardiomegaly. 2.  Bibasilar atelectasis and pleural effusions, left greater than right.   Original Report Authenticated By: San Morelle, M.D.     Anti-infectives: Anti-infectives   Start      Dose/Rate Route Frequency Ordered Stop   07/04/12 2200  ceFAZolin (ANCEF) IVPB 1 g/50 mL premix     1 g 100 mL/hr over 30 Minutes Intravenous 3 times per day 07/04/12 1759 07/05/12 1802   07/04/12 0600  ceFAZolin (ANCEF) IVPB 2 g/50 mL premix     2 g 100 mL/hr over 30 Minutes Intravenous On call to O.R. 07/03/12 1400 07/04/12 1245      Assessment/Plan: s/p Procedure(s): LAPAROSCOPIC UMBILICAL HERNIA (N/A) INSERTION OF MESH (N/A) He seems to be improving.  Pain better.  Awaiting dialysis.  He may be ready for discharge after dialysis but likely tomorrow morning.  LOS: 3 days    Madilyn Hook DAVID 07/07/2012

## 2012-07-08 DIAGNOSIS — D689 Coagulation defect, unspecified: Secondary | ICD-10-CM | POA: Insufficient documentation

## 2012-07-08 MED ORDER — HYDROCODONE-ACETAMINOPHEN 5-325 MG PO TABS: 1.0000 | ORAL_TABLET | Freq: Four times a day (QID) | ORAL | Status: DC | PRN

## 2012-07-08 NOTE — Progress Notes (Signed)
General Surgery Note  LOS: 4 days  POD - 4 Days Post-Op  Assessment/Plan: 1.  LAPAROSCOPIC UMBILICAL HERNIA, INSERTION OF MESH - B. Lilyan Punt - 07/04/2012  Doing better, ready to go home.  Discharge instructios reviewed with patient.   2.  ESRD  On MWF dialysis. 3.  DVT prophylaxis - SQ heparin 4.  History of renal carcinoma. 5.  Diabetes mellitus, but diet controlled. 6.  History of gout.  Subjective:  Doing well. Answered questions about discharge. Objective:   Filed Vitals:   07/08/12 0550  BP: 147/92  Pulse: 79  Temp: 99.1 F (37.3 C)  Resp: 17     Intake/Output from previous day:  02/14 0701 - 02/15 0700 In: 600 [P.O.:600] Out: -250   Intake/Output this shift:  Total I/O In: 120 [P.O.:120] Out: -    Physical Exam:   General: Large AA M who is alert and oriented.    HEENT: Normal. Pupils equal. .   Lungs: Clear   Abdomen: Large.   Wound: Look good.  Wearing binder.   Lab Results:    Recent Labs  07/07/12 1240  WBC 8.4  HGB 10.6*  HCT 31.5*  PLT 156    BMET   Recent Labs  07/07/12 1240  NA 133*  K 4.6  CL 92*  CO2 27  GLUCOSE 103*  BUN 56*  CREATININE 12.22*  CALCIUM 10.2    PT/INR  No results found for this basename: LABPROT, INR,  in the last 72 hours  ABG  No results found for this basename: PHART, PCO2, PO2, HCO3,  in the last 72 hours   Studies/Results:  No results found.   Anti-infectives:   Anti-infectives   Start     Dose/Rate Route Frequency Ordered Stop   07/04/12 2200  ceFAZolin (ANCEF) IVPB 1 g/50 mL premix     1 g 100 mL/hr over 30 Minutes Intravenous 3 times per day 07/04/12 1759 07/05/12 1802   07/04/12 0600  ceFAZolin (ANCEF) IVPB 2 g/50 mL premix     2 g 100 mL/hr over 30 Minutes Intravenous On call to O.R. 07/03/12 1400 07/04/12 1245      Alphonsa Overall, MD, [place] Pager: [phone],   [place] [facility] Office: [phone] 07/08/2012

## 2012-07-08 NOTE — Progress Notes (Signed)
PT Cancellation Note  Patient Details Name: Hoy Lyday MRN: [id number] DOB: January 06, 1961   Cancelled Treatment:    Reason Eval/Treat Not Completed: Other (comment) (patient just finished ambulating with nursing.  too fatigued)   Shanna Cisco 07/08/2012, 10:20 AM

## 2012-07-08 NOTE — Progress Notes (Signed)
[facility] Progress Note  Subjective:   They took too much fluid off in dialysis again. For d/c today.  Objective Filed Vitals:   07/07/12 1654 07/07/12 1806 07/07/12 2221 07/08/12 0550  BP: 132/79 128/76 124/68 147/92  Pulse: 91 88 78 79  Temp: 98.3 F (36.8 C) 98 F (36.7 C) 99.9 F (37.7 C) 99.1 F (37.3 C)  TempSrc: Oral Oral Oral Oral  Resp: 16 18 18 17   Height:      Weight: 119.3 kg (263 lb 0.1 oz)  123.1 kg (271 lb 6.2 oz)   SpO2: 99% 98% 98% 94%   Physical Exam General: NAD Heart: RRR Lungs: no wheezes or rales Abdomen: corset in place Extremities: no LE edmea Dialysis Access: left upper AVF + bruit  Dialysis Orders: Center: [facility] on MWF. EDW 121.5 kg HD Bath 2K/2.25Ca Time 4 hrs 15 mins Heparin 12,000 U bolus, 4000 U mid-Tx. Access AVF @ LFA BFR 450 DFR 800 Hectorol 2 mcg IV/HD Epogen 1000 Units IV/HD Venofer 0.   Assessment/Plan:  1. Incarcerated umbilical hernia - laparoscopic repair with mesh per Dr. Lilyan Punt 2/11- Dr. Lucia Gaskins d/c today 2. ESRD - HD on MWF @ [facility]; see #4 - change to tight heparin x 2 weeks then previous dose. 3. Debility- due to surgery but also vol depleted from excess UF at HD thursday 4.   Hypertension/volume -  Previously on Hydralazine 50 mg bid, Clonidine 0.2 mg bid, Amlodipine 10 mg qd, and Carvedilol 12.5 mg bid;  s/p net UF 3.7 L Wed.  Clonidine and hydralazine d/c yesterday - explained to wife and pt who is being d/c shortly.  Advised to not discard meds yet.  Per measures pt weight d/c to 119.3 from a pre HD weight of 121.2 essentially keeping even..  Hard to know what to do with EDW, but will decrease to 120.5 for now and adjust further at his outpt unit. 5     Anemia - Hgb 10.6 on outpatient Epogen 1000 U, no Fe (T-sat 35% on 1/22). Aranesp 40 mcg yesterday.- no change in Epo at discharge 6.    Metabolic bone disease -, last iPTH 236 on 1/22; on Hectorol 2 mcg, Phoslo 3 with meals.- no change in meds at d/c   Myriam Jacobson, PA-C [place] [phone] 07/08/2012,10:42 AM  LOS: 4 days   Patient seen and examined.  Agree with assessment and plan as above. Kelly Splinter  MD [phone] pgr    [phone] cell 07/08/2012, 11:21 AM   Additional Objective Labs: Basic Metabolic Panel:  Recent Labs Lab 07/04/12 1046 07/05/12 0800 07/07/12 1240  NA 139 138 133*  K 4.8 5.0 4.6  CL  --  99 92*  CO2  --  25 27  GLUCOSE 95 87 103*  BUN  --  48* 56*  CREATININE  --  9.49* 12.22*  CALCIUM  --  9.7 10.2  PHOS  --  5.6*  5.6* 5.1*   Liver Function Tests:  Recent Labs Lab 07/05/12 0800 07/07/12 1240  ALBUMIN 3.5 3.3*   CBC:  Recent Labs Lab 07/04/12 1046 07/05/12 0801 07/07/12 1240  WBC  --  5.5 8.4  HGB 10.5* 10.7* 10.6*  HCT 31.0* 31.9* 31.5*  MCV  --  93.3 91.8  PLT  --  152 156   BCBG:  Recent Labs Lab 07/04/12 1214 07/04/12 1512  GLUCAP 83 112*  Studies/Results: No results found. Medications:   . amLODipine  10 mg  Oral QPC lunch  . aspirin  325 mg Oral Daily  . calcium acetate  1,334 mg Oral TID WC  . carvedilol  12.5 mg Oral BID WC  . darbepoetin (ARANESP) injection - DIALYSIS  40 mcg Intravenous Q Wed-HD  . doxercalciferol  2 mcg Intravenous Q M,W,F-HD  . heparin subcutaneous  5,000 Units Subcutaneous Q8H  . isosorbide dinitrate  20 mg Oral BID  . multivitamin  1 tablet Oral QHS

## 2012-07-08 NOTE — Progress Notes (Signed)
Patient discharged to home. Patient AVS reviewed, prescriptions given to patient. Per Nephrology patient informed to discontinue Clonidine and Hydralazine until further notice by PCP and/or his nephrologist (see Nephrology note for details). Patient verbalized understanding of medications and follow-up appointments.  Patient remains stable; no signs or symptoms of distress.  Patient educated to return to the ER in cases of SOB, dizziness, fever, chest pain, or fainting and/or signs of infection.

## 2012-07-12 ENCOUNTER — Telehealth (INDEPENDENT_AMBULATORY_CARE_PROVIDER_SITE_OTHER): Payer: Self-pay

## 2012-07-12 NOTE — Telephone Encounter (Signed)
Pt requesting he be able to remove his abdominal binder after umbilica hernia repair.  He was told that he could remove it, but may need to wear it intermittently for comfort.

## 2012-07-17 NOTE — Discharge Summary (Signed)
Physician Discharge Summary  Patient ID: [name] MRN: [id number] DOB/AGE: [birth year] 52 y.o.  Admit date: 07/04/2012 Discharge date: 07/17/2012  Admission Diagnoses: ventral hernia  Discharge Diagnoses: same Active Problems:   History of nephrectomy   Discharged Condition: stable  Hospital Course: to OR on [id number] for lap umbilical hernia repair with mesh.  He was admitted overnight for dialysis on POD 1.  He had too much discomfort on POD 1 for discharge after dialysis and he remained in the hospital.  Diet was advanced but he was not very mobile.  On POD3 he was dialysed again and he was feeling better, ready for discharge on POD 4.   Consults: nephrology  Significant Diagnostic Studies: none   Treatments: dialysis: Hemodialysis and surgery: [id number] lap umbilical hernia repair   Disposition: 01-Home or Self Care  Discharge Orders   Future Appointments Provider Department Dept Phone   07/26/2012 1:15 PM Madilyn Hook, [facility], [place] [phone]   Future Orders Complete By Expires     Diet - low sodium heart healthy  As directed     Increase activity slowly  As directed         Medication List    TAKE these medications       amLODipine 10 MG tablet  Commonly known as:  NORVASC  Take 10 mg by mouth daily after lunch.     aspirin 325 MG tablet  Take 325 mg by mouth daily.     b complex-vitamin c-folic acid 0.8 MG Tabs  Take 0.8 mg by mouth at bedtime.     calcium acetate 667 MG capsule  Commonly known as:  PHOSLO  Take 1,334 mg by mouth 3 (three) times daily with meals.     carvedilol 25 MG tablet  Commonly known as:  COREG  Take 0.5 tablets (12.5 mg total) by mouth 2 (two) times daily.     cloNIDine 0.2 MG tablet  Commonly known as:  CATAPRES  Take 0.2 mg by mouth 3 (three) times daily.     hydrALAZINE 50 MG tablet  Commonly known as:  APRESOLINE  Take 50 mg by mouth 2 (two) times daily.     HYDROcodone-acetaminophen 5-325 MG per  tablet  Commonly known as:  NORCO/VICODIN  Take 1-2 tablets by mouth every 6 (six) hours as needed for pain.     isosorbide dinitrate 20 MG tablet  Commonly known as:  ISORDIL  Take 20 mg by mouth 2 (two) times daily.     oxyCODONE 5 MG immediate release tablet  Commonly known as:  Oxy IR/ROXICODONE  Take 5 mg by mouth every 4 (four) hours as needed. gout pain     oxyCODONE 5 MG immediate release tablet  Commonly known as:  Oxy IR/ROXICODONE  Take 1-2 tablets (5-10 mg total) by mouth every 4 (four) hours as needed for pain.     valsartan 320 MG tablet  Commonly known as:  DIOVAN  Take 320 mg by mouth daily.         SignedMadilyn Hook DAVID 07/17/2012, 1:33 PM

## 2012-07-26 ENCOUNTER — Encounter (INDEPENDENT_AMBULATORY_CARE_PROVIDER_SITE_OTHER): Payer: Self-pay | Admitting: General Surgery

## 2012-07-26 ENCOUNTER — Ambulatory Visit (INDEPENDENT_AMBULATORY_CARE_PROVIDER_SITE_OTHER): Payer: Medicare (Managed Care) | Admitting: General Surgery

## 2012-07-26 VITALS — BP 138/84 | HR 70 | Temp 97.4°F | Resp 16 | Ht 73.0 in | Wt 267.0 lb

## 2012-07-26 DIAGNOSIS — Z4889 Encounter for other specified surgical aftercare: Secondary | ICD-10-CM

## 2012-07-26 DIAGNOSIS — Z5189 Encounter for other specified aftercare: Secondary | ICD-10-CM

## 2012-07-26 NOTE — Progress Notes (Signed)
Subjective:     Patient ID: [name], male   DOB: 1960-08-04, 52 y.o.   MRN: [id number]  HPI This patient follows up 3 weeks status post laparoscopic repair of umbilical hernia. He has no complaints today. He is off pain medication and is tolerating regular diet. He continues on dialysis for his history of renal failure. His bowels are functioning normally.  Review of Systems     Objective:   Physical Exam  No acute distress and nontoxic-appearing His incisions are healing well without sign of infection. He has a persistent bulge under the umbilicus but this does not change with Valsalva and is nontender and I think that this is normal postop for a large ventral hernia.     Assessment:     Status post laparoscopic umbilical hernia  Mesh-doing well He seems to be doing well for the stage postoperatively. I recommended that he continue with light duty for another 2 weeks and then he can gradually increase his activity as tolerated. I think the bulge at the umbilicus is normal postop and is likely postoperative seroma and this should continue to improve over time. I recommended that if this persists at 3-4 months, that he should probably back and we will plan on CT scan of the abdomen at that time.     Plan:     followup in 3-4 months if his umbilical bulge persists and we will check CT scan at that time.

## 2012-08-01 ENCOUNTER — Telehealth (INDEPENDENT_AMBULATORY_CARE_PROVIDER_SITE_OTHER): Payer: Self-pay | Admitting: *Deleted

## 2012-08-01 NOTE — Telephone Encounter (Signed)
Patient called in to state that he is having intermittent left sided abdominal pain.  Patient has noticed a pattern that it comes when he stands up quickly or turns a certain way.  Explained the healing process with scar tissue to the patient who states understanding.  Patient will use NSAIDs for any mild pain and will try to stand up slower to avoid pulling at the scar tissue.  Patient encouraged to listen to his body and if it hurts to stop and try it a different way.  Patient agreeable at this time and states understanding.

## 2012-08-03 ENCOUNTER — Other Ambulatory Visit: Payer: Self-pay

## 2012-08-03 ENCOUNTER — Inpatient Hospital Stay (HOSPITAL_COMMUNITY)
Admission: EM | Admit: 2012-08-03 | Discharge: 2012-08-05 | DRG: 124 | Disposition: A | Discharge status: Home / Self Care | Payer: BC Managed Care – PPO | Attending: Cardiovascular Disease | Admitting: Cardiovascular Disease

## 2012-08-03 ENCOUNTER — Emergency Department (HOSPITAL_COMMUNITY): Payer: BC Managed Care – PPO

## 2012-08-03 ENCOUNTER — Encounter (HOSPITAL_COMMUNITY): Payer: Self-pay | Admitting: *Deleted

## 2012-08-03 DIAGNOSIS — Z6833 Body mass index (BMI) 33.0-33.9, adult: Secondary | ICD-10-CM

## 2012-08-03 DIAGNOSIS — I509 Heart failure, unspecified: Secondary | ICD-10-CM | POA: Diagnosis present

## 2012-08-03 DIAGNOSIS — I428 Other cardiomyopathies: Secondary | ICD-10-CM | POA: Diagnosis present

## 2012-08-03 DIAGNOSIS — E669 Obesity, unspecified: Secondary | ICD-10-CM | POA: Diagnosis present

## 2012-08-03 DIAGNOSIS — Z905 Acquired absence of kidney: Secondary | ICD-10-CM

## 2012-08-03 DIAGNOSIS — I120 Hypertensive chronic kidney disease with stage 5 chronic kidney disease or end stage renal disease: Secondary | ICD-10-CM | POA: Diagnosis present

## 2012-08-03 DIAGNOSIS — D631 Anemia in chronic kidney disease: Secondary | ICD-10-CM | POA: Diagnosis present

## 2012-08-03 DIAGNOSIS — M109 Gout, unspecified: Secondary | ICD-10-CM | POA: Diagnosis present

## 2012-08-03 DIAGNOSIS — Z85528 Personal history of other malignant neoplasm of kidney: Secondary | ICD-10-CM

## 2012-08-03 DIAGNOSIS — Z8673 Personal history of transient ischemic attack (TIA), and cerebral infarction without residual deficits: Secondary | ICD-10-CM

## 2012-08-03 DIAGNOSIS — N186 End stage renal disease: Secondary | ICD-10-CM | POA: Diagnosis present

## 2012-08-03 DIAGNOSIS — R0602 Shortness of breath: Secondary | ICD-10-CM

## 2012-08-03 DIAGNOSIS — I252 Old myocardial infarction: Secondary | ICD-10-CM

## 2012-08-03 DIAGNOSIS — I5023 Acute on chronic systolic (congestive) heart failure: Secondary | ICD-10-CM

## 2012-08-03 DIAGNOSIS — Z8249 Family history of ischemic heart disease and other diseases of the circulatory system: Secondary | ICD-10-CM

## 2012-08-03 DIAGNOSIS — Z992 Dependence on renal dialysis: Secondary | ICD-10-CM

## 2012-08-03 DIAGNOSIS — N2581 Secondary hyperparathyroidism of renal origin: Secondary | ICD-10-CM | POA: Diagnosis present

## 2012-08-03 DIAGNOSIS — Z79899 Other long term (current) drug therapy: Secondary | ICD-10-CM

## 2012-08-03 DIAGNOSIS — E119 Type 2 diabetes mellitus without complications: Secondary | ICD-10-CM | POA: Diagnosis present

## 2012-08-03 DIAGNOSIS — Z7982 Long term (current) use of aspirin: Secondary | ICD-10-CM

## 2012-08-03 DIAGNOSIS — I2510 Atherosclerotic heart disease of native coronary artery without angina pectoris: Secondary | ICD-10-CM | POA: Diagnosis present

## 2012-08-03 LAB — CBC
HCT: 33.6 % — ABNORMAL LOW (ref 39.0–52.0)
Hemoglobin: 11.2 g/dL — ABNORMAL LOW (ref 13.0–17.0)
MCH: 30.4 pg (ref 26.0–34.0)
MCHC: 33.3 g/dL (ref 30.0–36.0)
MCV: 91.1 fL (ref 78.0–100.0)
Platelets: 155 10*3/uL (ref 150–400)
RBC: 3.69 MIL/uL — ABNORMAL LOW (ref 4.22–5.81)
RDW: 13.7 % (ref 11.5–15.5)
WBC: 4.6 10*3/uL (ref 4.0–10.5)

## 2012-08-03 LAB — POCT I-STAT, CHEM 8
BUN: 22 mg/dL (ref 6–23)
Calcium, Ion: 1.22 mmol/L (ref 1.12–1.23)
Chloride: 100 mEq/L (ref 96–112)
Creatinine, Ser: 6.1 mg/dL — ABNORMAL HIGH (ref 0.50–1.35)
Glucose, Bld: 72 mg/dL (ref 70–99)
HCT: 28 % — ABNORMAL LOW (ref 39.0–52.0)
Hemoglobin: 9.5 g/dL — ABNORMAL LOW (ref 13.0–17.0)
Potassium: 3.9 mEq/L (ref 3.5–5.1)
Sodium: 140 mEq/L (ref 135–145)
TCO2: 32 mmol/L (ref 0–100)

## 2012-08-03 LAB — CBC WITH DIFFERENTIAL/PLATELET
Basophils Absolute: 0 10*3/uL (ref 0.0–0.1)
Basophils Relative: 0 % (ref 0–1)
Eosinophils Absolute: 0.2 10*3/uL (ref 0.0–0.7)
Eosinophils Relative: 6 % — ABNORMAL HIGH (ref 0–5)
HCT: 27.1 % — ABNORMAL LOW (ref 39.0–52.0)
Hemoglobin: 9.1 g/dL — ABNORMAL LOW (ref 13.0–17.0)
Lymphocytes Relative: 28 % (ref 12–46)
Lymphs Abs: 1 10*3/uL (ref 0.7–4.0)
MCH: 29.8 pg (ref 26.0–34.0)
MCHC: 33.6 g/dL (ref 30.0–36.0)
MCV: 88.9 fL (ref 78.0–100.0)
Monocytes Absolute: 0.4 10*3/uL (ref 0.1–1.0)
Monocytes Relative: 10 % (ref 3–12)
Neutro Abs: 2 10*3/uL (ref 1.7–7.7)
Neutrophils Relative %: 55 % (ref 43–77)
Platelets: 152 10*3/uL (ref 150–400)
RBC: 3.05 MIL/uL — ABNORMAL LOW (ref 4.22–5.81)
RDW: 14.2 % (ref 11.5–15.5)
WBC: 3.6 10*3/uL — ABNORMAL LOW (ref 4.0–10.5)

## 2012-08-03 LAB — RENAL FUNCTION PANEL
Albumin: 3.3 g/dL — ABNORMAL LOW (ref 3.5–5.2)
BUN: 23 mg/dL (ref 6–23)
CO2: 29 mEq/L (ref 19–32)
Calcium: 9.9 mg/dL (ref 8.4–10.5)
Chloride: 100 mEq/L (ref 96–112)
Creatinine, Ser: 6.77 mg/dL — ABNORMAL HIGH (ref 0.50–1.35)
GFR calc Af Amer: 10 mL/min — ABNORMAL LOW (ref >90)
GFR calc non Af Amer: 9 mL/min — ABNORMAL LOW (ref >90)
Glucose, Bld: 80 mg/dL (ref 70–99)
Phosphorus: 3.5 mg/dL (ref 2.3–4.6)
Potassium: 4.1 mEq/L (ref 3.5–5.1)
Sodium: 137 mEq/L (ref 135–145)

## 2012-08-03 LAB — BASIC METABOLIC PANEL
BUN: 22 mg/dL (ref 6–23)
CO2: 29 mEq/L (ref 19–32)
Calcium: 9.9 mg/dL (ref 8.4–10.5)
Chloride: 99 mEq/L (ref 96–112)
Creatinine, Ser: 6.58 mg/dL — ABNORMAL HIGH (ref 0.50–1.35)
GFR calc Af Amer: 10 mL/min — ABNORMAL LOW (ref >90)
GFR calc non Af Amer: 9 mL/min — ABNORMAL LOW (ref >90)
Glucose, Bld: 79 mg/dL (ref 70–99)
Potassium: 4.6 mEq/L (ref 3.5–5.1)
Sodium: 138 mEq/L (ref 135–145)

## 2012-08-03 LAB — OCCULT BLOOD, POC DEVICE: Fecal Occult Bld: NEGATIVE

## 2012-08-03 LAB — PRO B NATRIURETIC PEPTIDE: Pro B Natriuretic peptide (BNP): 9191 pg/mL — ABNORMAL HIGH (ref 0–125)

## 2012-08-03 LAB — HEPATITIS B SURFACE ANTIGEN: Hepatitis B Surface Ag: NEGATIVE

## 2012-08-03 LAB — POCT I-STAT TROPONIN I: Troponin i, poc: 0.05 ng/mL (ref 0.00–0.08)

## 2012-08-03 LAB — CREATININE, SERUM
Creatinine, Ser: 4.2 mg/dL — ABNORMAL HIGH (ref 0.50–1.35)
GFR calc Af Amer: 18 mL/min — ABNORMAL LOW (ref >90)
GFR calc non Af Amer: 15 mL/min — ABNORMAL LOW (ref >90)

## 2012-08-03 LAB — GLUCOSE, CAPILLARY: Glucose-Capillary: 92 mg/dL (ref 70–99)

## 2012-08-03 MED ORDER — SODIUM CHLORIDE 0.9 % IV SOLN: 100.0000 mL | INTRAVENOUS | Status: DC | PRN

## 2012-08-03 MED ORDER — NEPRO/CARBSTEADY PO LIQD
237.0000 mL | ORAL | Status: DC | PRN
  Filled 2012-08-03: qty 237

## 2012-08-03 MED ORDER — ONDANSETRON HCL 4 MG/2ML IJ SOLN: 4.0000 mg | Freq: Four times a day (QID) | INTRAMUSCULAR | Status: DC | PRN

## 2012-08-03 MED ORDER — SODIUM CHLORIDE 0.9 % IJ SOLN: 3.0000 mL | INTRAMUSCULAR | Status: DC | PRN

## 2012-08-03 MED ORDER — LIDOCAINE-PRILOCAINE 2.5-2.5 % EX CREA: 1.0000 "application " | TOPICAL_CREAM | CUTANEOUS | Status: DC | PRN

## 2012-08-03 MED ORDER — ACETAMINOPHEN 325 MG PO TABS: 650.0000 mg | ORAL_TABLET | ORAL | Status: DC | PRN

## 2012-08-03 MED ORDER — SODIUM CHLORIDE 0.9 % IV SOLN: 250.0000 mL | INTRAVENOUS | Status: DC | PRN

## 2012-08-03 MED ORDER — DIAZEPAM 5 MG PO TABS
5.0000 mg | ORAL_TABLET | ORAL | Status: AC
  Administered 2012-08-03: 5 mg via ORAL
  Filled 2012-08-03: qty 1

## 2012-08-03 MED ORDER — ALTEPLASE 2 MG IJ SOLR
2.0000 mg | Freq: Once | INTRAMUSCULAR | Status: AC | PRN
  Filled 2012-08-03: qty 2

## 2012-08-03 MED ORDER — LIDOCAINE HCL (PF) 1 % IJ SOLN: 5.0000 mL | INTRAMUSCULAR | Status: DC | PRN

## 2012-08-03 MED ORDER — CARVEDILOL 12.5 MG PO TABS
12.5000 mg | ORAL_TABLET | Freq: Two times a day (BID) | ORAL | Status: DC
  Administered 2012-08-03 – 2012-08-05 (×2): 12.5 mg via ORAL
  Filled 2012-08-03 (×6): qty 1

## 2012-08-03 MED ORDER — RENA-VITE PO TABS
1.0000 | ORAL_TABLET | Freq: Every day | ORAL | Status: DC
  Administered 2012-08-03 – 2012-08-05 (×2): 1 via ORAL
  Filled 2012-08-03 (×3): qty 1

## 2012-08-03 MED ORDER — NITROGLYCERIN 2 % TD OINT
1.0000 [in_us] | TOPICAL_OINTMENT | Freq: Once | TRANSDERMAL | Status: AC
  Administered 2012-08-03: 1 [in_us] via TOPICAL
  Filled 2012-08-03: qty 1

## 2012-08-03 MED ORDER — HEPARIN SODIUM (PORCINE) 1000 UNIT/ML DIALYSIS
1000.0000 [IU] | INTRAMUSCULAR | Status: DC | PRN
  Filled 2012-08-03: qty 1

## 2012-08-03 MED ORDER — AMLODIPINE BESYLATE 10 MG PO TABS
10.0000 mg | ORAL_TABLET | Freq: Every day | ORAL | Status: DC
  Filled 2012-08-03 (×3): qty 1

## 2012-08-03 MED ORDER — CALCIUM ACETATE 667 MG PO CAPS: 1334.0000 mg | ORAL_CAPSULE | Freq: Three times a day (TID) | ORAL | Status: DC

## 2012-08-03 MED ORDER — ASPIRIN 325 MG PO TABS
325.0000 mg | ORAL_TABLET | Freq: Every day | ORAL | Status: DC
  Administered 2012-08-05: 325 mg via ORAL
  Filled 2012-08-03 (×2): qty 1

## 2012-08-03 MED ORDER — SODIUM CHLORIDE 0.9 % IJ SOLN
3.0000 mL | Freq: Two times a day (BID) | INTRAMUSCULAR | Status: DC
  Administered 2012-08-04 – 2012-08-05 (×2): 3 mL via INTRAVENOUS

## 2012-08-03 MED ORDER — CALCIUM ACETATE 667 MG PO CAPS
1334.0000 mg | ORAL_CAPSULE | Freq: Three times a day (TID) | ORAL | Status: DC
  Administered 2012-08-03 – 2012-08-05 (×6): 1334 mg via ORAL
  Filled 2012-08-03 (×8): qty 2

## 2012-08-03 MED ORDER — ISOSORBIDE DINITRATE 20 MG PO TABS
20.0000 mg | ORAL_TABLET | Freq: Two times a day (BID) | ORAL | Status: DC
  Administered 2012-08-03 – 2012-08-05 (×3): 20 mg via ORAL
  Filled 2012-08-03 (×5): qty 1

## 2012-08-03 MED ORDER — CLONIDINE HCL 0.2 MG PO TABS
0.2000 mg | ORAL_TABLET | Freq: Three times a day (TID) | ORAL | Status: DC
  Administered 2012-08-04 – 2012-08-05 (×2): 0.2 mg via ORAL
  Filled 2012-08-03 (×7): qty 1

## 2012-08-03 MED ORDER — HEPARIN SODIUM (PORCINE) 5000 UNIT/ML IJ SOLN
5000.0000 [IU] | Freq: Three times a day (TID) | INTRAMUSCULAR | Status: DC
  Administered 2012-08-04 – 2012-08-05 (×3): 5000 [IU] via SUBCUTANEOUS
  Filled 2012-08-03 (×8): qty 1

## 2012-08-03 MED ORDER — ALPRAZOLAM 0.25 MG PO TABS
0.2500 mg | ORAL_TABLET | Freq: Two times a day (BID) | ORAL | Status: DC | PRN
  Administered 2012-08-04: 0.25 mg via ORAL
  Filled 2012-08-03: qty 1

## 2012-08-03 MED ORDER — HEPARIN SODIUM (PORCINE) 1000 UNIT/ML DIALYSIS
2000.0000 [IU] | INTRAMUSCULAR | Status: DC | PRN
  Filled 2012-08-03: qty 2

## 2012-08-03 MED ORDER — HYDROMORPHONE HCL PF 1 MG/ML IJ SOLN
0.5000 mg | Freq: Once | INTRAMUSCULAR | Status: AC
  Administered 2012-08-03: 0.5 mg via INTRAVENOUS
  Filled 2012-08-03: qty 1

## 2012-08-03 MED ORDER — HYDRALAZINE HCL 50 MG PO TABS
50.0000 mg | ORAL_TABLET | Freq: Two times a day (BID) | ORAL | Status: DC
  Administered 2012-08-03 – 2012-08-05 (×3): 50 mg via ORAL
  Filled 2012-08-03 (×5): qty 1

## 2012-08-03 MED ORDER — HYDROCODONE-ACETAMINOPHEN 5-325 MG PO TABS: 1.0000 | ORAL_TABLET | Freq: Four times a day (QID) | ORAL | Status: DC | PRN

## 2012-08-03 MED ORDER — SODIUM CHLORIDE 0.9 % IJ SOLN
3.0000 mL | Freq: Two times a day (BID) | INTRAMUSCULAR | Status: DC
  Administered 2012-08-03: 3 mL via INTRAVENOUS

## 2012-08-03 MED ORDER — PENTAFLUOROPROP-TETRAFLUOROETH EX AERO: 1.0000 "application " | INHALATION_SPRAY | CUTANEOUS | Status: DC | PRN

## 2012-08-03 MED ORDER — CALCIUM ACETATE 667 MG PO CAPS
667.0000 mg | ORAL_CAPSULE | ORAL | Status: DC
  Administered 2012-08-03: 667 mg via ORAL
  Administered 2012-08-04 – 2012-08-05 (×2): 1334 mg via ORAL
  Filled 2012-08-03 (×7): qty 2

## 2012-08-03 MED ORDER — IRBESARTAN 300 MG PO TABS
300.0000 mg | ORAL_TABLET | Freq: Every day | ORAL | Status: DC
  Administered 2012-08-05: 300 mg via ORAL
  Filled 2012-08-03 (×3): qty 1

## 2012-08-03 MED ORDER — ONDANSETRON 4 MG PO TBDP
4.0000 mg | ORAL_TABLET | Freq: Once | ORAL | Status: AC
  Administered 2012-08-03: 4 mg via ORAL
  Filled 2012-08-03: qty 1

## 2012-08-03 NOTE — ED Provider Notes (Signed)
History     CSN: [id number]  Arrival date & time 08/03/12  [id number]   First MD Initiated Contact with Patient 08/03/12 [phone]      Chief Complaint  Patient presents with  . Shortness of Breath   HPI (Consider location/radiation/quality/duration/timing/severity/associated sxs/prior Treatment) Patient is a 52 yo AA male with PMH significant for CHF, CVA, MI (2006?), CAD, HTN, renal cell carcinoma (2002), ESRD, DM, and s/p hernia repair (Feb 2014) presents to the ED with acute onset of SOB that began at 3 am this morning. Patient has associated PND, DOE, orthopnea, nonproductive cough, post tussive pleuritic chest pain, vomiting x1 episode with small amount of bright red blood, nausea, lightheadedness, and diaphoresis.  Denies chest pain, fevers, chills, syncope, lower extremity edema, or claudication. Does not use oxygen at home. Patient states he had a previous similar episode of SOB between 71mo-38mo ago. Patient completed dialysis yesterday at [facility]) without complication.   PCP: Dr. [facility] Nephrologist: Dr. Jonnie Finner dialysis MWF Cardiologist: Dr. Doylene Canard July, 10, 2012 last ECHO EF 25%-30%  Physical Exam  VS: 98.12F P 73 RR 16 BP 157/93 SpO2 100% RA HEENT: Normocephalic, atraumatic, oropharynx clear Neck: no lymphadenopathy noted Respiratory: mild respiratory distress with accessory muscle use, symmetric chest wall expansion, no retractions noted, diminished BS bases bilaterally CV: regular rate and rhythm with normal S1 S2 no murmurs, rubs, or gallops appreciated Abdomen: soft, appropriately tender at surgical incision site  Extremities: no lower extremity edema Neuro: Awake, alert, oriented X3   Patient is a 52 y.o. male presenting with shortness of breath. The history is provided by the patient.  Shortness of Breath Chronicity: l. Associated symptoms comment:       Past Medical History  Diagnosis Date  . CHF (congestive heart failure)    . History of unilateral nephrectomy   . Hypertension   . Cardiomyopathy   . Gout   . Myocardial infarction ?2006  . Shortness of breath 05/19/11    "at rest, lying down, w/exertion"  . Umbilical hernia [id number]    unrepaired  . Stroke 02/2011    05/19/11 denies residual  . Dialysis patient     Mon-Wed-Fri  . Diabetes mellitus     pt. states he's borderline diabetic., no longer taking med,- off med. since 2013  . Renal cell carcinoma     dialysis- MWF  . ESRD (end stage renal disease)   . Coronary artery disease     normal coronaries by 10/10/08 cath  . History of nephrectomy 07/04/2012    History of right nephrectomy in 2002 for renal cell carcinoma     Past Surgical History  Procedure Laterality Date  . Nephrectomy  2000    right  . Av fistula placement  03/29/2011    Procedure: ARTERIOVENOUS (AV) FISTULA CREATION;  Surgeon: Hinda Lenis, MD;  Location: [facility];  Service: Vascular;  Laterality: Left;  LEFT RADIOCEPHALIC , Arteriovenous [id number])  . Smashed  1990's    "left pinky; have a plate in there"  . US echocardiography  05/20/11  . Cardiac catheterization  05/21/11  . Finger surgery      L pinkie finger- ORIF- /w remaining hardware - 1990's    . Umbilical hernia repair N/A 07/04/2012    Procedure: LAPAROSCOPIC UMBILICAL HERNIA;  Surgeon: Madilyn Hook, DO;  Location: [facility];  Service: General;  Laterality: N/A;  . Insertion of mesh N/A 07/04/2012    Procedure: INSERTION OF MESH;  Surgeon: Aaron Edelman  Lilyan Punt, DO;  Location: [facility] OR;  Service: General;  Laterality: N/A;  . Hernia repair N/A [id number]    Umbilical hernia repair    Family History  Problem Relation Age of Onset  . Hypertension Mother   . Kidney disease Mother     History  Substance Use Topics  . Smoking status: Never Smoker   . Smokeless tobacco: Never Used  . Alcohol Use: No      Review of Systems  Constitutional: Positive for chills, appetite change and fatigue.       Weight loss related  to dialysis   HENT: Positive for congestion, rhinorrhea and sinus pressure.   Respiratory: Positive for chest tightness and shortness of breath.        Chest tightness  Cardiovascular: Positive for palpitations.  Gastrointestinal: Positive for nausea. Negative for diarrhea, constipation and blood in stool.  Neurological: Positive for dizziness and light-headedness.    Allergies  Ace inhibitors  Home Medications   Current Outpatient Rx  Name  Route  Sig  Dispense  Refill  . amLODipine (NORVASC) 10 MG tablet   Oral   Take 10 mg by mouth daily after lunch.          Marland Kitchen aspirin 325 MG tablet   Oral   Take 325 mg by mouth daily.          Marland Kitchen b complex-vitamin c-folic acid (NEPHRO-VITE) 0.8 MG TABS   Oral   Take 0.8 mg by mouth at bedtime.         . calcium acetate (PHOSLO) 667 MG capsule   Oral   Take 1,334 mg by mouth 3 (three) times daily with meals. Also takes 1-3 capsule with snacks, dose depends on what the snack is         . carvedilol (COREG) 25 MG tablet   Oral   Take 0.5 tablets (12.5 mg total) by mouth 2 (two) times daily.   1 tablet   0   . cloNIDine (CATAPRES) 0.2 MG tablet   Oral   Take 0.2 mg by mouth 3 (three) times daily.          . hydrALAZINE (APRESOLINE) 50 MG tablet   Oral   Take 50 mg by mouth 2 (two) times daily.          . isosorbide dinitrate (ISORDIL) 20 MG tablet   Oral   Take 20 mg by mouth 2 (two) times daily.          Marland Kitchen oxyCODONE (OXY IR/ROXICODONE) 5 MG immediate release tablet   Oral   Take 1-2 tablets (5-10 mg total) by mouth every 4 (four) hours as needed for pain.   45 tablet   0   . valsartan (DIOVAN) 320 MG tablet   Oral   Take 320 mg by mouth daily.         Marland Kitchen HYDROcodone-acetaminophen (NORCO/VICODIN) 5-325 MG per tablet   Oral   Take 1-2 tablets by mouth every 6 (six) hours as needed for pain.   30 tablet   1     BP 157/93  Temp(Src) 98.2 F (36.8 C) (Oral)  Resp 16  SpO2 100%  Physical Exam   Constitutional: He appears well-developed and well-nourished. He appears distressed.  HENT:  Head: Normocephalic and atraumatic.  Cardiovascular: Normal rate, regular rhythm and normal heart sounds.   Pulmonary/Chest: Accessory muscle usage present. He has decreased breath sounds in the right middle field, the right lower field and the left lower field.  He exhibits no tenderness, no deformity, no swelling and no retraction.  Symmetrical expansion   Abdominal: Soft. There is tenderness.  Appropriately sore around incision s/p hernia repair less than one month ago.   Musculoskeletal:       Right lower leg: He exhibits no edema.       Left lower leg: He exhibits no edema.  Lymphadenopathy:       Head (right side): No submental, no submandibular, no tonsillar, no preauricular, no posterior auricular and no occipital adenopathy present.       Head (left side): No submental, no submandibular, no tonsillar, no preauricular, no posterior auricular and no occipital adenopathy present.       Right cervical: No superficial cervical, no deep cervical and no posterior cervical adenopathy present.      Left cervical: No superficial cervical, no deep cervical and no posterior cervical adenopathy present.    ED Course  Procedures (including critical care time)  11:28A stool is hemoccult negative  Labs Reviewed  CBC WITH DIFFERENTIAL - Abnormal; Notable for the following:    WBC 3.6 (*)    RBC 3.05 (*)    Hemoglobin 9.1 (*)    HCT 27.1 (*)    Eosinophils Relative 6 (*)    All other components within normal limits  PRO B NATRIURETIC PEPTIDE - Abnormal; Notable for the following:    Pro B Natriuretic peptide (BNP) 9191.0 (*)    All other components within normal limits  POCT I-STAT TROPONIN I   Dg Chest Port 1 View  08/03/2012  *RADIOLOGY REPORT*  Clinical Data: Shortness of breath.  PORTABLE CHEST - 1 VIEW  Comparison: 07/05/2012  Findings: Cardiomegaly with vascular congestion.  No confluent  opacity, effusion or overt edema.  No acute bony abnormality.  IMPRESSION: Cardiomegaly, vascular congestion.   Original Report Authenticated By: Rolm Baptise, M.D.      1. Systolic CHF, acute on chronic   2. SOB (shortness of breath)     Patient feeling more comfortable after receiving Dilaudid.   As discussed with Dr. Audie Pinto patient will receive Nitroprusside paste to try to alleviate SOB.   Nitroprusside has improved patient's complaint of shortness of breath. Lung fields are improved on examination after nitroprusside.   Internist, Nephrologist, and Cardiologist consulted on admission for patient given his PMHx and chief complaint.   Given low HGB heme occult blood test was done. Result came back negative.   Patient will be admitted for observation.    Date: 08/03/2012  Rate: 76  Rhythm: normal sinus rhythm  QRS Axis: left  Intervals: normal  ST/T Wave abnormalities: normal  Conduction Disutrbances:nonspecific intraventricular conduction delay  Narrative Interpretation:   Old EKG Reviewed: unchanged    MDM  Patient is a 52 yo AA male with PMHx significant for CHF, CAD, HTN, renal carcinoma, and DM  presented to the ED with acute onset shortness of breath that began earlier this morning. Patient was in mild respiratory distress at time of arrival. CXR did not show any new or acute pathology from previous CXR. EKG relatively unchanged from previous EKG Feb 2014. Dr. Jonnie Finner and Dr. Doylene Canard were both consulted on patient for admission for acute on chronic systolic CHF and SOB. Lab results listed above also support the clinical diagnosis and decision for admission. Shortness of breath improved with nitroprusside paste while in the ED. After discussion with Dr. Audie Pinto and the two consults the decision  made to admit patient for further observation and work up  if deemed necessary by his Cardiologist and medical team. Patient is stable at time of transport.      Harlow Mares, PA-C 08/03/12 1544

## 2012-08-03 NOTE — H&P (Signed)
[name] is an 52 y.o. male.   Chief Complaint: CP and shortness of breath HPI: 52 year old male with dilated cardiomyopathy, hypertension, renal carcinoma and DM, II had acute onset of shortness of breath with some chest discomfort. Post dialysis and NTG use today he is feeling better. No fever or cough. Mild leg edema.   Past Medical History  Diagnosis Date  . CHF (congestive heart failure)   . History of unilateral nephrectomy   . Hypertension   . Cardiomyopathy   . Gout   . Myocardial infarction ?2006  . Shortness of breath 05/19/11    "at rest, lying down, w/exertion"  . Umbilical hernia [id number]    unrepaired  . Stroke 02/2011    05/19/11 denies residual  . Dialysis patient     Mon-Wed-Fri  . Diabetes mellitus     pt. states he's borderline diabetic., no longer taking med,- off med. since 2013  . Renal cell carcinoma     dialysis- MWF  . ESRD (end stage renal disease)   . Coronary artery disease     normal coronaries by 10/10/08 cath  . History of nephrectomy 07/04/2012    History of right nephrectomy in 2002 for renal cell carcinoma       Past Surgical History  Procedure Laterality Date  . Nephrectomy  2000    right  . Av fistula placement  03/29/2011    Procedure: ARTERIOVENOUS (AV) FISTULA CREATION;  Surgeon: Hinda Lenis, MD;  Location: [place];  Service: Vascular;  Laterality: Left;  LEFT RADIOCEPHALIC , Arteriovenous [id number])  . Smashed  1990's    "left pinky; have a plate in there"  . US echocardiography  05/20/11  . Cardiac catheterization  05/21/11  . Finger surgery      L pinkie finger- ORIF- /w remaining hardware - 1990's    . Umbilical hernia repair N/A 07/04/2012    Procedure: LAPAROSCOPIC UMBILICAL HERNIA;  Surgeon: Madilyn Hook, DO;  Location: [place];  Service: General;  Laterality: N/A;  . Insertion of mesh N/A 07/04/2012    Procedure: INSERTION OF MESH;  Surgeon: Madilyn Hook, DO;  Location: [place];  Service: General;  Laterality: N/A;  .  Hernia repair N/A [id number]    Umbilical hernia repair    Family History  Problem Relation Age of Onset  . Hypertension Mother   . Kidney disease Mother    Social History:  reports that he has never smoked. He has never used smokeless tobacco. He reports that he does not drink alcohol or use illicit drugs.  Allergies:  Allergies  Allergen Reactions  . Ace Inhibitors Cough     (Not in a hospital admission)  Results for orders placed during the hospital encounter of 08/03/12 (from the past 48 hour(s))  CBC WITH DIFFERENTIAL     Status: Abnormal   Collection Time    08/03/12  7:49 AM      Result Value Range   WBC 3.6 (*) 4.0 - 10.5 K/uL   RBC 3.05 (*) 4.22 - 5.81 MIL/uL   Hemoglobin 9.1 (*) 13.0 - 17.0 g/dL   HCT 27.1 (*) 39.0 - 52.0 %   MCV 88.9  78.0 - 100.0 fL   MCH 29.8  26.0 - 34.0 pg   MCHC 33.6  30.0 - 36.0 g/dL   RDW 14.2  11.5 - 15.5 %   Platelets 152  150 - 400 K/uL   Neutrophils Relative 55  43 - 77 %   Neutro  Abs 2.0  1.7 - 7.7 K/uL   Lymphocytes Relative 28  12 - 46 %   Lymphs Abs 1.0  0.7 - 4.0 K/uL   Monocytes Relative 10  3 - 12 %   Monocytes Absolute 0.4  0.1 - 1.0 K/uL   Eosinophils Relative 6 (*) 0 - 5 %   Eosinophils Absolute 0.2  0.0 - 0.7 K/uL   Basophils Relative 0  0 - 1 %   Basophils Absolute 0.0  0.0 - 0.1 K/uL  PRO B NATRIURETIC PEPTIDE     Status: Abnormal   Collection Time    08/03/12  7:52 AM      Result Value Range   Pro B Natriuretic peptide (BNP) 9191.0 (*) 0 - 125 pg/mL  POCT I-STAT TROPONIN I     Status: None   Collection Time    08/03/12  8:10 AM      Result Value Range   Troponin i, poc 0.05  0.00 - 0.08 ng/mL   Comment 3            Comment: Due to the release kinetics of cTnI,     a negative result within the first hours     of the onset of symptoms does not rule out     myocardial infarction with certainty.     If myocardial infarction is still suspected,     repeat the test at appropriate intervals.  BASIC METABOLIC  PANEL     Status: Abnormal   Collection Time    08/03/12 11:00 AM      Result Value Range   Sodium 138  135 - 145 mEq/L   Potassium 4.6  3.5 - 5.1 mEq/L   Chloride 99  96 - 112 mEq/L   CO2 29  19 - 32 mEq/L   Glucose, Bld 79  70 - 99 mg/dL   BUN 22  6 - 23 mg/dL   Creatinine, Ser 6.58 (*) 0.50 - 1.35 mg/dL   Calcium 9.9  8.4 - 10.5 mg/dL   GFR calc non Af Amer 9 (*) >90 mL/min   GFR calc Af Amer 10 (*) >90 mL/min   Comment:            The eGFR has been calculated     using the CKD EPI equation.     This calculation has not been     validated in all clinical     situations.     eGFR's persistently     <90 mL/min signify     possible Chronic Kidney Disease.  POCT I-STAT, CHEM 8     Status: Abnormal   Collection Time    08/03/12 11:10 AM      Result Value Range   Sodium 140  135 - 145 mEq/L   Potassium 3.9  3.5 - 5.1 mEq/L   Chloride 100  96 - 112 mEq/L   BUN 22  6 - 23 mg/dL   Creatinine, Ser 6.10 (*) 0.50 - 1.35 mg/dL   Glucose, Bld 72  70 - 99 mg/dL   Calcium, Ion 1.22  1.12 - 1.23 mmol/L   TCO2 32  0 - 100 mmol/L   Hemoglobin 9.5 (*) 13.0 - 17.0 g/dL   HCT 28.0 (*) 39.0 - 52.0 %  OCCULT BLOOD, POC DEVICE     Status: None   Collection Time    08/03/12 11:28 AM      Result Value Range   Fecal Occult Bld NEGATIVE  NEGATIVE  RENAL FUNCTION PANEL     Status: Abnormal   Collection Time    08/03/12 12:55 PM      Result Value Range   Sodium 137  135 - 145 mEq/L   Potassium 4.1  3.5 - 5.1 mEq/L   Chloride 100  96 - 112 mEq/L   CO2 29  19 - 32 mEq/L   Glucose, Bld 80  70 - 99 mg/dL   BUN 23  6 - 23 mg/dL   Creatinine, Ser 6.77 (*) 0.50 - 1.35 mg/dL   Calcium 9.9  8.4 - 10.5 mg/dL   Phosphorus 3.5  2.3 - 4.6 mg/dL   Albumin 3.3 (*) 3.5 - 5.2 g/dL   GFR calc non Af Amer 9 (*) >90 mL/min   GFR calc Af Amer 10 (*) >90 mL/min   Comment:            The eGFR has been calculated     using the CKD EPI equation.     This calculation has not been     validated in all  clinical     situations.     eGFR's persistently     <90 mL/min signify     possible Chronic Kidney Disease.   Dg Chest Port 1 View  08/03/2012  *RADIOLOGY REPORT*  Clinical Data: Shortness of breath.  PORTABLE CHEST - 1 VIEW  Comparison: 07/05/2012  Findings: Cardiomegaly with vascular congestion.  No confluent opacity, effusion or overt edema.  No acute bony abnormality.  IMPRESSION: Cardiomegaly, vascular congestion.   Original Report Authenticated By: Rolm Baptise, M.D.     @ROS @ Constitutional: Negative for fever, weight loss and fatigue.  HENT: Negative for congestion, sore throat, neck pain, neck stiffness and sinus pressure.  Eyes: Negative for visual disturbance.  Respiratory: Positive for shortness of breath. Negative for apnea, cough, choking, chest tightness and wheezing.  Cardiovascular: Positive for leg swelling. Negative for chest pain and palpitations.  Gastrointestinal: Positive for nausea, abdominal pain and diarrhea. Negative for vomiting, melena, hematochezia and hematemesis.  Genitourinary: Positive for flank pain. Negative for dysuria.  Musculoskeletal: Negative for myalgias and arthralgias.  Skin: Negative for rash.  Neurological: Negative for facial asymmetry and weakness.   Blood pressure 171/96, pulse 73, temperature 98.2 F (36.8 C), temperature source Oral, resp. rate 13, weight 118.9 kg (262 lb 2 oz), SpO2 97.00%.  Constitutional: He is oriented to person, place, and time. He appears well-developed and well-nourished. Mild respiratory distress.  HENT: Normocephalic and atraumatic. Brown eyes, conj-pink, PERL, EOMI. No scleral icterus. Tongue pink and dry.  Neck: Normal range of motion. Neck supple. No tracheal deviation present. No thyromegaly present.  Cardiovascular: Normal rate, regular rhythm, normal heart sounds and intact distal pulses. II/VI systolic murmur. Pulmonary/Chest: Effort normal and breath sounds normal. He has no wheezes. He exhibits no  tenderness.  Abdominal: Soft. Bowel sounds are normal.  Musculoskeletal: Normal range of motion. He exhibits trace edema and no tenderness.  Neurological: He is alert and oriented to person, place, and time. He has normal reflexes. Coordination normal.  Skin: Skin is warm and dry. No rash noted. He is not diaphoretic. No erythema.  Psychiatric: He has a normal mood and affect. His behavior is normal.   Assessment/Plan Acute on chronic left heart failure  ESRD with hemodialysis, MWF.  DM, II  Gout  Obesity  Appreciate emergent dialysis/nephrology consult. Admit/Home medications.  Deniya Craigo S 08/03/2012, 2:21 PM

## 2012-08-03 NOTE — ED Provider Notes (Signed)
Medical screening examination/treatment/procedure(s) were conducted as a shared visit with non-physician practitioner(s) and myself.  I personally evaluated the patient during the encounter   Dot Lanes, MD 08/03/12 1250

## 2012-08-03 NOTE — ED Provider Notes (Signed)
Pt is a 52 yo M w hx of CHF, CAD, HTN, renal carcinoma & DM that presented w acute onset SOB at 3am associated with post tussive pleuritic CP, no F/NS/cChills, leg swelling. Pt arrived with mildly hypertensive, afebrile and w a pulse ox of 100% on RA, how wver he was in mild respiratory distress with retractions. Improved w nirto paste.   Consult to Nephrology Dr. Gar Ponto, discussed pt: dialysis completed yesterday without complication, xray imaging without concerning fluid over load. SOB improved with Nirto paste. Dry weight lighter then usual per patient. Not likely renal issue, but will see patient as consult   Consult Cardiology, Dr. Doylene Canard,  To admit patient   PCP Dr. Iona Beard, [facility] Nephrologist Dr. Mercy Moore, MWF dialysis at [facility]  Cardiologist Dr. Doylene Canard, July 10,2012 echo EF:25-30%  The patient appears reasonably stabilized for admission considering the current resources, flow, and capabilities available in the ED at this time, and I doubt any other [facility] requiring further screening and/or treatment in the ED prior to admission.         [name], [place] 08/03/12 1244

## 2012-08-03 NOTE — Consult Note (Signed)
[name] 08/03/2012 [place] Requesting Physician:  Dr Audie Pinto  Reason for Consult:  Renal failure pt with SOB , had HD yesterday HPI: The patient is a 52 y.o. year-old with hx of HTN, gout, MI, cardiomyopathy and ESRD on hemodialysis at [facility] MWF.  Patient came to last two HD sessions barely over dry weight, but at the same time has been having marked DOE and some SOB at rest. This is main complaint. Lots of coughing and some N/V yesterday.  Came to ED today, cxr showed mild CHF/vasc congestion. Feels better after NTP, BP was high, on 5-6 BP meds at home.  Takes meds regularly.  ROS  no fever, rash, ha syncope or CP  no diarrhea  had periumbilical hernia surgery within past few weeks  no joint pain or swelling, mild swelling of ankles  no confusion    Past Medical History:  Past Medical History  Diagnosis Date  . CHF (congestive heart failure)   . History of unilateral nephrectomy   . Hypertension   . Cardiomyopathy   . Gout   . Myocardial infarction ?2006  . Shortness of breath 05/19/11    "at rest, lying down, w/exertion"  . Umbilical hernia [id number]    unrepaired  . Stroke 02/2011    05/19/11 denies residual  . Dialysis patient     Mon-Wed-Fri  . Diabetes mellitus     pt. states he's borderline diabetic., no longer taking med,- off med. since 2013  . Renal cell carcinoma     dialysis- MWF  . ESRD (end stage renal disease)   . Coronary artery disease     normal coronaries by 10/10/08 cath  . History of nephrectomy 07/04/2012    History of right nephrectomy in 2002 for renal cell carcinoma     Past Surgical History:  Past Surgical History  Procedure Laterality Date  . Nephrectomy  2000    right  . Av fistula placement  03/29/2011    Procedure: ARTERIOVENOUS (AV) FISTULA CREATION;  Surgeon: Hinda Lenis, MD;  Location: [place];  Service: Vascular;  Laterality: Left;  LEFT RADIOCEPHALIC , Arteriovenous [id number])  . Smashed  1990's    "left pinky;  have a plate in there"  . US echocardiography  05/20/11  . Cardiac catheterization  05/21/11  . Finger surgery      L pinkie finger- ORIF- /w remaining hardware - 1990's    . Umbilical hernia repair N/A 07/04/2012    Procedure: LAPAROSCOPIC UMBILICAL HERNIA;  Surgeon: Madilyn Hook, DO;  Location: [place];  Service: General;  Laterality: N/A;  . Insertion of mesh N/A 07/04/2012    Procedure: INSERTION OF MESH;  Surgeon: Madilyn Hook, DO;  Location: [place];  Service: General;  Laterality: N/A;  . Hernia repair N/A [id number]    Umbilical hernia repair    Family History:  Family History  Problem Relation Age of Onset  . Hypertension Mother   . Kidney disease Mother    Social History:  reports that he has never smoked. He has never used smokeless tobacco. He reports that he does not drink alcohol or use illicit drugs.  Allergies:  Allergies  Allergen Reactions  . Ace Inhibitors Cough    Home medications: Prior to Admission medications   Medication Sig Start Date End Date Taking? Authorizing Provider  amLODipine (NORVASC) 10 MG tablet Take 10 mg by mouth daily after lunch.    Yes Historical Provider, MD  aspirin 325 MG tablet Take 325 mg by  mouth daily.    Yes Historical Provider, MD  b complex-vitamin c-folic acid (NEPHRO-VITE) 0.8 MG TABS Take 0.8 mg by mouth at bedtime.   Yes Historical Provider, MD  calcium acetate (PHOSLO) 667 MG capsule Take 1,334 mg by mouth 3 (three) times daily with meals. Also takes 1-3 capsule with snacks, dose depends on what the snack is   Yes Historical Provider, MD  carvedilol (COREG) 25 MG tablet Take 0.5 tablets (12.5 mg total) by mouth 2 (two) times daily. 05/24/11  Yes Birdie Riddle, MD  cloNIDine (CATAPRES) 0.2 MG tablet Take 0.2 mg by mouth 3 (three) times daily.    Yes Historical Provider, MD  hydrALAZINE (APRESOLINE) 50 MG tablet Take 50 mg by mouth 2 (two) times daily.    Yes Historical Provider, MD  isosorbide dinitrate (ISORDIL) 20 MG tablet Take  20 mg by mouth 2 (two) times daily.    Yes Historical Provider, MD  oxyCODONE (OXY IR/ROXICODONE) 5 MG immediate release tablet Take 1-2 tablets (5-10 mg total) by mouth every 4 (four) hours as needed for pain. 07/07/12  Yes Madilyn Hook, DO  valsartan (DIOVAN) 320 MG tablet Take 320 mg by mouth daily.   Yes Historical Provider, MD  HYDROcodone-acetaminophen (NORCO/VICODIN) 5-325 MG per tablet Take 1-2 tablets by mouth every 6 (six) hours as needed for pain. 07/08/12   Shann Medal, MD    Labs: Basic Metabolic Panel: No results found for this basename: NA, K, CL, CO2, GLUCOSE, BUN, CREATININE, ALB, CALCIUM, PHOS,  in the last 168 hours Liver Function Tests: No results found for this basename: AST, ALT, ALKPHOS, BILITOT, PROT, ALBUMIN,  in the last 168 hours No results found for this basename: LIPASE, AMYLASE,  in the last 168 hours No results found for this basename: AMMONIA,  in the last 168 hours CBC:  Recent Labs Lab 08/03/12 0749  WBC 3.6*  NEUTROABS 2.0  HGB 9.1*  HCT 27.1*  MCV 88.9  PLT 152   PT/INR: @LABRCNTIP (inr:5) Cardiac Enzymes: )No results found for this basename: CKTOTAL, CKMB, CKMBINDEX, TROPONINI,  in the last 168 hours CBG: No results found for this basename: GLUCAP,  in the last 168 hours   Physical Exam:  Blood pressure 154/94, pulse 73, temperature 98.2 F (36.8 C), temperature source Oral, resp. rate 18, SpO2 96.00%.  Gen: alert, no distress, no oxygen on Skin: no rash, cyanosis HEENT:  EOMI, sclera anicteric, throat clear Neck: mod JVD about 12 cm, no LAN Chest: faint rales R base, L is clear CV: regular, no rub or gallop, no murmur Abdomen: soft, obese, no mass or HSM, healing scar in LLQ without signs of infection Ext: 1+ LE edema bilat, no joint effusion or deformity, no gangrene or ulceration Neuro: alert, Ox3, no focal deficit   Impression/Plan 1. Dyspnea in ESRD with hx of CM, mild CHF on CXR- trop and EKG are negative, not in distress but  very dyspneic on arrival per report.  May have lost body weight. Will do extra dialysis today and then again tomorrow, lower dry weight as tolerated. He says it was recently lowered from 120kg to 119 and then to 118 kg yesterday. 2. ESRD, hd mwf 3. HTN- multiple BP meds 4. Hx MI / ICM 5. Recently hernia repair surgery 6. Hx R nephrectomy   Kelly Splinter  MD [facility] [phone] pgr    [phone] cell 08/03/2012, 11:00 AM

## 2012-08-03 NOTE — ED Notes (Signed)
Patient states he awoke this am with sob, patient states productive cough at that time, patient dialysis patient with last treatment yesterday,

## 2012-08-04 ENCOUNTER — Other Ambulatory Visit: Payer: Self-pay

## 2012-08-04 ENCOUNTER — Encounter (HOSPITAL_COMMUNITY)
Admission: EM | Disposition: A | Discharge status: Home / Self Care | Payer: Self-pay | Source: Home / Self Care | Attending: Cardiovascular Disease

## 2012-08-04 HISTORY — PX: LEFT AND RIGHT HEART CATHETERIZATION WITH CORONARY ANGIOGRAM: SHX5449

## 2012-08-04 LAB — PROTIME-INR
INR: 1.05 (ref 0.00–1.49)
Prothrombin Time: 13.6 seconds (ref 11.6–15.2)

## 2012-08-04 LAB — BASIC METABOLIC PANEL
BUN: 22 mg/dL (ref 6–23)
CO2: 30 mEq/L (ref 19–32)
Calcium: 10 mg/dL (ref 8.4–10.5)
Chloride: 99 mEq/L (ref 96–112)
Creatinine, Ser: 6.03 mg/dL — ABNORMAL HIGH (ref 0.50–1.35)
GFR calc Af Amer: 11 mL/min — ABNORMAL LOW (ref >90)
GFR calc non Af Amer: 10 mL/min — ABNORMAL LOW (ref >90)
Glucose, Bld: 82 mg/dL (ref 70–99)
Potassium: 4.3 mEq/L (ref 3.5–5.1)
Sodium: 139 mEq/L (ref 135–145)

## 2012-08-04 LAB — CBC
HCT: 28.2 % — ABNORMAL LOW (ref 39.0–52.0)
Hemoglobin: 9.2 g/dL — ABNORMAL LOW (ref 13.0–17.0)
MCH: 29.5 pg (ref 26.0–34.0)
MCHC: 32.6 g/dL (ref 30.0–36.0)
MCV: 90.4 fL (ref 78.0–100.0)
Platelets: 155 10*3/uL (ref 150–400)
RBC: 3.12 MIL/uL — ABNORMAL LOW (ref 4.22–5.81)
RDW: 13.8 % (ref 11.5–15.5)
WBC: 4.4 10*3/uL (ref 4.0–10.5)

## 2012-08-04 LAB — POCT I-STAT 3, ART BLOOD GAS (G3+)
Acid-Base Excess: 6 mmol/L — ABNORMAL HIGH (ref 0.0–2.0)
Bicarbonate: 31.1 mEq/L — ABNORMAL HIGH (ref 20.0–24.0)
O2 Saturation: 94 %
TCO2: 32 mmol/L (ref 0–100)
pCO2 arterial: 45.6 mmHg — ABNORMAL HIGH (ref 35.0–45.0)
pH, Arterial: 7.442 (ref 7.350–7.450)
pO2, Arterial: 68 mmHg — ABNORMAL LOW (ref 80.0–100.0)

## 2012-08-04 LAB — POCT I-STAT 3, VENOUS BLOOD GAS (G3P V)
Acid-Base Excess: 5 mmol/L — ABNORMAL HIGH (ref 0.0–2.0)
Bicarbonate: 30.2 mEq/L — ABNORMAL HIGH (ref 20.0–24.0)
O2 Saturation: 79 %
TCO2: 32 mmol/L (ref 0–100)
pCO2, Ven: 47.7 mmHg (ref 45.0–50.0)
pH, Ven: 7.41 — ABNORMAL HIGH (ref 7.250–7.300)
pO2, Ven: 44 mmHg (ref 30.0–45.0)

## 2012-08-04 LAB — GLUCOSE, CAPILLARY
Glucose-Capillary: 81 mg/dL (ref 70–99)
Glucose-Capillary: 82 mg/dL (ref 70–99)
Glucose-Capillary: 95 mg/dL (ref 70–99)

## 2012-08-04 SURGERY — LEFT AND RIGHT HEART CATHETERIZATION WITH CORONARY ANGIOGRAM
Anesthesia: LOCAL

## 2012-08-04 MED ORDER — DOXERCALCIFEROL 4 MCG/2ML IV SOLN
INTRAVENOUS | Status: AC
  Administered 2012-08-04: 2 ug via INTRAVENOUS
  Filled 2012-08-04: qty 2

## 2012-08-04 MED ORDER — FENTANYL CITRATE 0.05 MG/ML IJ SOLN
INTRAMUSCULAR | Status: AC
  Filled 2012-08-04: qty 2

## 2012-08-04 MED ORDER — HEPARIN SODIUM (PORCINE) 1000 UNIT/ML DIALYSIS: 2000.0000 [IU] | Freq: Once | INTRAMUSCULAR | Status: DC

## 2012-08-04 MED ORDER — DARBEPOETIN ALFA-POLYSORBATE 60 MCG/0.3ML IJ SOLN
60.0000 ug | INTRAMUSCULAR | Status: DC
  Filled 2012-08-04: qty 0.3

## 2012-08-04 MED ORDER — HEPARIN (PORCINE) IN NACL 2-0.9 UNIT/ML-% IJ SOLN
INTRAMUSCULAR | Status: AC
  Filled 2012-08-04: qty 1000

## 2012-08-04 MED ORDER — LIDOCAINE-PRILOCAINE 2.5-2.5 % EX CREA: 1.0000 "application " | TOPICAL_CREAM | CUTANEOUS | Status: DC | PRN

## 2012-08-04 MED ORDER — SODIUM CHLORIDE 0.9 % IV SOLN: 100.0000 mL | INTRAVENOUS | Status: DC | PRN

## 2012-08-04 MED ORDER — HEPARIN SODIUM (PORCINE) 1000 UNIT/ML DIALYSIS: 1000.0000 [IU] | INTRAMUSCULAR | Status: DC | PRN

## 2012-08-04 MED ORDER — NEPRO/CARBSTEADY PO LIQD: 237.0000 mL | ORAL | Status: DC | PRN

## 2012-08-04 MED ORDER — PENTAFLUOROPROP-TETRAFLUOROETH EX AERO: 1.0000 "application " | INHALATION_SPRAY | CUTANEOUS | Status: DC | PRN

## 2012-08-04 MED ORDER — ALTEPLASE 2 MG IJ SOLR: 2.0000 mg | Freq: Once | INTRAMUSCULAR | Status: DC | PRN

## 2012-08-04 MED ORDER — MIDAZOLAM HCL 2 MG/2ML IJ SOLN
INTRAMUSCULAR | Status: AC
  Filled 2012-08-04: qty 2

## 2012-08-04 MED ORDER — CEFAZOLIN SODIUM 1-5 GM-% IV SOLN: 1.0000 g | Freq: Once | INTRAVENOUS | Status: DC

## 2012-08-04 MED ORDER — LIDOCAINE HCL (PF) 1 % IJ SOLN
INTRAMUSCULAR | Status: AC
  Filled 2012-08-04: qty 30

## 2012-08-04 MED ORDER — DARBEPOETIN ALFA-POLYSORBATE 60 MCG/0.3ML IJ SOLN
INTRAMUSCULAR | Status: AC
  Administered 2012-08-04: 60 ug via INTRAVENOUS
  Filled 2012-08-04: qty 0.3

## 2012-08-04 MED ORDER — LIDOCAINE HCL (PF) 1 % IJ SOLN: 5.0000 mL | INTRAMUSCULAR | Status: DC | PRN

## 2012-08-04 MED ORDER — DOXERCALCIFEROL 4 MCG/2ML IV SOLN
2.0000 ug | INTRAVENOUS | Status: DC
  Filled 2012-08-04: qty 2

## 2012-08-04 NOTE — ED Provider Notes (Signed)
Medical screening examination/treatment/procedure(s) were conducted as a shared visit with non-physician practitioner(s) and myself.  I personally evaluated the patient during the encounter   Dot Lanes, MD 08/04/12 [phone]

## 2012-08-04 NOTE — Care Management Note (Signed)
    Page 1 of 1   08/04/2012     12:03:49 PM   CARE MANAGEMENT NOTE 08/04/2012  Patient:  [name]   Account Number:  [id number]  Date Initiated:  08/04/2012  Documentation initiated by:  Llana Aliment  Subjective/Objective Assessment:   52yo male admtted with CHF.  Pt. lives with spouse.     Action/Plan:   Discharge planning   Anticipated DC Date:  08/04/2012   Anticipated DC Plan:  [place]  CM consult      Choice offered to / List presented to:             Status of service:  In process, will continue to follow Medicare Important Message given?   (If response is "NO", the following Medicare IM given date fields will be blank) Date Medicare IM given:   Date Additional Medicare IM given:    Discharge Disposition:    Per UR Regulation:  Reviewed for med. necessity/level of care/duration of stay  If discussed at [place] of Stay Meetings, dates discussed:    Comments:  08/04/12 1130 In to complete Heart Failure [place].  Pt. did not want home health services at this time. Llana Aliment, RN, BSN NCM [phone]

## 2012-08-04 NOTE — Progress Notes (Signed)
Utilization Review Completed.   Briceida Rasberry, RN, BSN Nurse Case Manager  [phone]  

## 2012-08-04 NOTE — Progress Notes (Signed)
1015 transferred from cardiac cath via stretcher with cardiac cath staff . Fully awake alert and oriented . No pain . R groin level O . With  dressing dry  And intact . Kept R  lower extremity straight  > Instructions given to pt . compliant

## 2012-08-04 NOTE — Progress Notes (Signed)
[facility] Progress Note  Subjective:   Doesn't want much fluid taken off at dialysis today. Had cramping yesterday.  Objective Filed Vitals:   08/04/12 0139 08/04/12 0515 08/04/12 0732 08/04/12 1018  BP: 130/77 135/85  141/93  Pulse: 73 72 72 72  Temp: 98.5 F (36.9 C) 97.6 F (36.4 C)    TempSrc: Oral Oral    Resp: 18 18  19   Height:      Weight:  116.4 kg (256 lb 9.9 oz)    SpO2: 98% 99%  97%   Physical Exam General: NAD depressed affect Heart: RRR  Lungs: no wheezes or rales Abdomen: obese soft Extremities: no significant edema Dialysis Access:  Left lower AVF  Dialysis orders: get outpt records. Pm 3/10 was lowered to 119 from 120.5 - he left post HD 118.9 on 3/12; Epo 1200 (increased from 1000 for Hgb 9.6.  Iron deficiency 14% sat Fe 27 in February but no Fe given because ferritin 1500 in January - previously he had received 50 venofer per week. Hectorol 2 4.25 hours standard heparin with mid bolus 2K 2.25 Ca 450/800  Assessment/Plan: 1. CP/SOB - no overt coronary artery findings heart cath ED 30-40%; pt has been resistant to lowering EDW much in the outpt setting, though has been allowing it gradually. Net  UF 2.8 yesterday with 3 hour treatment; Post HD weight 116.4 standing scale. No troponins. Have decreased goal to 2.5 today due to cramping yesterday.   2. ESRD - MWF; No change in BUN noted after 3 hours of dialysis yesterday and not much decrease in Cr - would have expected more. 3. Anemia - Hgb 9.s, dose Aranesp 60 x 1 today; recheck ferritin as an outpt; needs stool cards - if not obtained here; needs as an outpt; recheck ferritin with March labs. 4. Secondary hyperparathyroidism - continue hectorol 5. HTN/volume -  Improved with decreased volume; continue to challenge; on norvasc 10 clonidine 0.2 tid, coreg 12.5 bid 6. Nutrition - renal diet 7. Recent hernia repair 07/04/12 - likely has lost weight during this process. 8. Hx, MI, ISCM 27. Hx renal  cell Ca s/p right nephrectomy 10. DM - diet controlled  Myriam Jacobson, PA-C [place] [phone] 08/04/2012,11:19 AM  LOS: 1 day   Patient seen and examined.  Agree with assessment and plan as above. Kelly Splinter  MD [phone] pgr    [phone] cell 08/04/2012, 12:27 PM   Additional Objective Labs: Basic Metabolic Panel:  Recent Labs Lab 08/03/12 1100 08/03/12 1110 08/03/12 1255 08/03/12 1720 08/04/12 0530  NA 138 140 137  --  139  K 4.6 3.9 4.1  --  4.3  CL 99 100 100  --  99  CO2 29  --  29  --  30  GLUCOSE 79 72 80  --  82  BUN 22 22 23   --  22  CREATININE 6.58* 6.10* 6.77* 4.20* 6.03*  CALCIUM 9.9  --  9.9  --  10.0  PHOS  --   --  3.5  --   --    Liver Function Tests:  Recent Labs Lab 08/03/12 1255  ALBUMIN 3.3*  CBC:  Recent Labs Lab 08/03/12 0749 08/03/12 1110 08/03/12 1720 08/04/12 0530  WBC 3.6*  --  4.6 4.4  NEUTROABS 2.0  --   --   --   HGB 9.1* 9.5* 11.2* 9.2*  HCT 27.1* 28.0* 33.6* 28.2*  MCV 88.9  --  91.1 90.4  PLT 152  --  155 155   CBG:  Recent Labs Lab 08/03/12 2119 08/04/12 0936  GLUCAP 92 81  Studies/Results: Dg Chest Port 1 View  08/03/2012  *RADIOLOGY REPORT*  Clinical Data: Shortness of breath.  PORTABLE CHEST - 1 VIEW  Comparison: 07/05/2012  Findings: Cardiomegaly with vascular congestion.  No confluent opacity, effusion or overt edema.  No acute bony abnormality.  IMPRESSION: Cardiomegaly, vascular congestion.   Original Report Authenticated By: Rolm Baptise, M.D.    Medications:   . amLODipine  10 mg Oral QPC lunch  . aspirin  325 mg Oral Daily  . calcium acetate  1,334 mg Oral TID WC  . calcium acetate  667-1,334 mg Oral With snacks  . carvedilol  12.5 mg Oral BID WC  .  ceFAZolin (ANCEF) IV  1 g Intravenous Once  . cloNIDine  0.2 mg Oral TID  . [START ON 08/05/2012] heparin  2,000 Units Dialysis Once in dialysis  . heparin  5,000 Units Subcutaneous Q8H  . hydrALAZINE  50 mg Oral BID  .  irbesartan  300 mg Oral Daily  . isosorbide dinitrate  20 mg Oral BID  . multivitamin  1 tablet Oral Daily  . sodium chloride  3 mL Intravenous Q12H

## 2012-08-04 NOTE — CV Procedure (Signed)
PROCEDURE:  Left heart catheterization with selective coronary angiography, left ventriculogram.  CLINICAL HISTORY:  This is a 52 year old male with congestive heart failure and ESRD with hypertension and DM, II.  The risks, benefits, and details of the procedure were explained to the patient.  The patient verbalized understanding and wanted to proceed.  Informed written consent was obtained.  PROCEDURE TECHNIQUE:  The patient was approached from the right femoral artery using a 5 and 6 French short sheaths and right femoral vein using 7 fr. Short sheath.  Left coronary angiography was done using a Judkins L4 guide catheter.  Right coronary angiography was done using a Judkins R4 guide catheter.  Left ventriculography was not done. Right heart pressures and cardiac output measurements were done using a Swan Ganz catheter.    CONTRAST:  Total of 60 cc.  COMPLICATIONS:  None.  At the end of the procedure a manual pressure device was used for hemostasis.    HEMODYNAMICS:  Aortic pressure was 150/97; LV pressure was 148/11; LVEDP 19.  There was no gradient between the left ventricle and aorta.   Wedge pressure was 18, PA-51/14, RV-39/9 and RA -10 mm of Hg. AO sat was 94 % and PA sat was 49 %. Cardiac output was 5.65 by Fick and 7.47 L/min by thermal dilution technique. ANGIOGRAM/CORONARY ARTERIOGRAM:   The left main coronary artery is double barrell.  The left anterior descending artery has luminal irregularities with 20 % eccentric lesions in proximal 1/3 of the vessel. Diagonal had similar luminal irregularities proximally.   The left circumflex artery is dominant and unremarkable.  The right coronary artery is small and unremarkable.  LEFT VENTRICULOGRAM:  Left ventricular angiogram was not done. Echocardiogram had showed dilated LV with moderate generalized hypokinesia. EF 30-40 %. LVEDP was 19 mmHg.  IMPRESSION OF HEART CATHETERIZATION:   1. Normal left main coronary artery. 2. Mild  disease of left anterior descending artery and its branches. 3. Normal left circumflex artery and its branches. 4. Normal right coronary artery. 5. Dilated LV with moderate left ventricular systolic dysfunction.  LVEDP 19 mmHg.  Ejection fraction 30-40%.  RECOMMENDATION:   Medical treatment with life style modification.

## 2012-08-04 NOTE — Progress Notes (Signed)
Subjective:  Breathing better. No chest pain. Afebrile.  Objective:  Vital Signs in the last 24 hours: Temp:  [97.9 F (36.6 C)-98.5 F (36.9 C)] 98.5 F (36.9 C) (03/14 0139) Pulse Rate:  [69-82] 73 (03/14 0139) Cardiac Rhythm:  [-] Normal sinus rhythm (03/13 2211) Resp:  [13-26] 18 (03/14 0139) BP: (130-183)/(77-109) 130/77 mmHg (03/14 0139) SpO2:  [90 %-100 %] 98 % (03/14 0139) Weight:  [116.3 kg (256 lb 6.3 oz)-118.9 kg (262 lb 2 oz)] 116.302 kg (256 lb 6.4 oz) (03/13 1716)  Physical Exam: BP Readings from Last 1 Encounters:  08/04/12 130/77     Wt Readings from Last 1 Encounters:  08/03/12 116.302 kg (256 lb 6.4 oz)    Weight change:   HEENT: /AT, Eyes-Brown, PERL, EOMI, Conjunctiva-Pink, Sclera-Non-icteric Neck: No JVD, No bruit, Trachea midline. Lungs:  Clear, Bilateral. Cardiac:  Regular rhythm, normal S1 and S2, no S3. II/VI systolic murmur.  Abdomen:  Soft, non-tender. Extremities:  No edema present. No cyanosis. No clubbing. CNS: AxOx3, Cranial nerves grossly intact, moves all 4 extremities. Right handed. Skin: Warm and dry.   Intake/Output from previous day: 03/13 0701 - 03/14 0700 In: 360 [P.O.:360] Out: 2821     Lab Results: BMET    Component Value Date/Time   NA 139 08/04/2012 0530   K 4.3 08/04/2012 0530   CL 99 08/04/2012 0530   CO2 30 08/04/2012 0530   GLUCOSE 82 08/04/2012 0530   BUN 22 08/04/2012 0530   CREATININE 6.03* 08/04/2012 0530   CALCIUM 10.0 08/04/2012 0530   GFRNONAA 10* 08/04/2012 0530   GFRAA 11* 08/04/2012 0530   CBC    Component Value Date/Time   WBC 4.4 08/04/2012 0530   RBC 3.12* 08/04/2012 0530   HGB 9.2* 08/04/2012 0530   HCT 28.2* 08/04/2012 0530   PLT 155 08/04/2012 0530   MCV 90.4 08/04/2012 0530   MCH 29.5 08/04/2012 0530   MCHC 32.6 08/04/2012 0530   RDW 13.8 08/04/2012 0530   LYMPHSABS 1.0 08/03/2012 0749   MONOABS 0.4 08/03/2012 0749   EOSABS 0.2 08/03/2012 0749   BASOSABS 0.0 08/03/2012 0749   CARDIAC ENZYMES Lab  Results  Component Value Date   CKTOTAL 113 05/20/2011   CKMB 4.3* 05/20/2011   TROPONINI <0.30 05/20/2011    Scheduled Meds: . amLODipine  10 mg Oral QPC lunch  . aspirin  325 mg Oral Daily  . calcium acetate  1,334 mg Oral TID WC  . calcium acetate  667-1,334 mg Oral With snacks  . carvedilol  12.5 mg Oral BID WC  . cloNIDine  0.2 mg Oral TID  . [START ON 08/05/2012] heparin  2,000 Units Dialysis Once in dialysis  . heparin  5,000 Units Subcutaneous Q8H  . hydrALAZINE  50 mg Oral BID  . irbesartan  300 mg Oral Daily  . isosorbide dinitrate  20 mg Oral BID  . multivitamin  1 tablet Oral Daily  . sodium chloride  3 mL Intravenous Q12H  . sodium chloride  3 mL Intravenous Q12H   Continuous Infusions:  PRN Meds:.sodium chloride, sodium chloride, sodium chloride, sodium chloride, sodium chloride, sodium chloride, acetaminophen, ALPRAZolam, alteplase, feeding supplement (NEPRO CARB STEADY), feeding supplement (NEPRO CARB STEADY), heparin, heparin, heparin, HYDROcodone-acetaminophen, lidocaine (PF), lidocaine (PF), lidocaine-prilocaine, lidocaine-prilocaine, ondansetron (ZOFRAN) IV, pentafluoroprop-tetrafluoroeth, pentafluoroprop-tetrafluoroeth sodium chloride, sodium chloride  Assessment/Plan: Acute on chronic left heart failure  ESRD with hemodialysis, MWF.  DM, II  Gout  Obesity  R + L heart cath.  LOS: 1 day    Dixie Dials  MD  08/04/2012, 7:28 AM

## 2012-08-04 NOTE — Progress Notes (Signed)
  Echocardiogram 2D Echocardiogram has been performed.  [name] 08/04/2012, 4:16 PM

## 2012-08-05 LAB — BASIC METABOLIC PANEL
BUN: 18 mg/dL (ref 6–23)
CO2: 28 mEq/L (ref 19–32)
Calcium: 9.6 mg/dL (ref 8.4–10.5)
Chloride: 99 mEq/L (ref 96–112)
Creatinine, Ser: 5.52 mg/dL — ABNORMAL HIGH (ref 0.50–1.35)
GFR calc Af Amer: 13 mL/min — ABNORMAL LOW (ref >90)
GFR calc non Af Amer: 11 mL/min — ABNORMAL LOW (ref >90)
Glucose, Bld: 89 mg/dL (ref 70–99)
Potassium: 4.6 mEq/L (ref 3.5–5.1)
Sodium: 136 mEq/L (ref 135–145)

## 2012-08-05 LAB — GLUCOSE, CAPILLARY
Glucose-Capillary: 75 mg/dL (ref 70–99)
Glucose-Capillary: 86 mg/dL (ref 70–99)
Glucose-Capillary: 99 mg/dL (ref 70–99)

## 2012-08-05 NOTE — Progress Notes (Signed)
Heart Failure education reinforced utilizing teach back method.  Verbalized understanding.  IV sites discontinued.

## 2012-08-05 NOTE — Discharge Summary (Signed)
Physician Discharge Summary  Patient ID: [name] MRN: [id number] DOB/AGE: [birth year] 52 y.o.  Admit date: 08/03/2012 Discharge date: 08/05/2012  Admission Diagnoses: Acute on chronic left heart failure  ESRD with hemodialysis, MWF.  DM, II  Gout  Obesity  Discharge Diagnoses:  Principle Problem: * Acute on chronic left heart systolic failure*  Non-ischemic dilated cardiomyopathy ESRD with hemodialysis, (MWF).  DM, II  Gout  Obesity Anemia of chronic kidney disease   Discharged Condition: good  Hospital Course: 52 year old male with dilated cardiomyopathy, hypertension, renal carcinoma and DM, II had acute onset of shortness of breath with some chest discomfort. Post dialysis and dry weight reduction he is better. His cardiac catheterization revealed non-ichemic dilated cardiomyopathy. Once again he was advised to decrease food and fluid intake by 50 %. He will be followed by me in 1 week.   Consults: Nephrology.  Significant Diagnostic Studies: labs: Hgb-9.1, low normal WBC and platelets, Pro BNP of 9191 pg/ml, normal electrolytes.  ECG: SR with LVH and ST-T changes.  Echocardiogram: Dilated LV with severe systolic dysfunction.  Coronary angiography: Non-ischemic dilated cardiomyopathy.  Treatments: dialysis: Hemodialysis and cardiac medications.  Discharge Exam: Blood pressure 130/87, pulse 74, temperature 98.7 F (37.1 C), temperature source Oral, resp. rate 18, height 6\' 1"  (1.854 m), weight 114.488 kg (252 lb 6.4 oz), SpO2 100.00%. HEENT: [place]/AT, Eyes-Brown, PERL, EOMI, Conjunctiva-Pink, Sclera-Non-icteric  Neck: No JVD, No bruit, Trachea midline.  Lungs: Clear, Bilateral.  Cardiac: Regular rhythm, normal S1 and S2, no S3. II/VI systolic murmur.  Abdomen: Soft, distended but non-tender.  Extremities: No edema present. No cyanosis. No clubbing.  CNS: AxOx3, Cranial nerves grossly intact, moves all 4 extremities. Right handed.  Skin: Warm and  dry.   Disposition: 01-Home or Self Care   Future Appointments Provider Department Dept Phone   10/11/2012 2:00 PM Madilyn Hook, [facility], [place] [phone]       Medication List    STOP taking these medications       oxyCODONE 5 MG immediate release tablet  Commonly known as:  Oxy IR/ROXICODONE      TAKE these medications       amLODipine 10 MG tablet  Commonly known as:  NORVASC  Take 10 mg by mouth daily after lunch.     aspirin 325 MG tablet  Take 325 mg by mouth daily.     b complex-vitamin c-folic acid 0.8 MG Tabs  Take 0.8 mg by mouth at bedtime.     calcium acetate 667 MG capsule  Commonly known as:  PHOSLO  Take 1,334 mg by mouth 3 (three) times daily with meals. Also takes 1-3 capsule with snacks, dose depends on what the snack is     carvedilol 25 MG tablet  Commonly known as:  COREG  Take 0.5 tablets (12.5 mg total) by mouth 2 (two) times daily.     cloNIDine 0.2 MG tablet  Commonly known as:  CATAPRES  Take 0.2 mg by mouth 3 (three) times daily.     hydrALAZINE 50 MG tablet  Commonly known as:  APRESOLINE  Take 50 mg by mouth 2 (two) times daily.     HYDROcodone-acetaminophen 5-325 MG per tablet  Commonly known as:  NORCO/VICODIN  Take 1-2 tablets by mouth every 6 (six) hours as needed for pain.     isosorbide dinitrate 20 MG tablet  Commonly known as:  ISORDIL  Take 20 mg by mouth 2 (two) times daily.     valsartan  320 MG tablet  Commonly known as:  DIOVAN  Take 320 mg by mouth daily.           Follow-up Information   Follow up with [facility] S, MD. Schedule an appointment as soon as possible for a visit in 1 week.   Contact information:   [place] [address] [phone]       Signed: Birdie Riddle 08/05/2012, 10:33 AM

## 2012-09-23 ENCOUNTER — Emergency Department (HOSPITAL_COMMUNITY): Payer: BC Managed Care – PPO

## 2012-09-23 ENCOUNTER — Emergency Department (HOSPITAL_COMMUNITY)
Admission: EM | Admit: 2012-09-23 | Discharge: 2012-09-23 | Disposition: A | Discharge status: Home / Self Care | Payer: BC Managed Care – PPO | Attending: Emergency Medicine | Admitting: Emergency Medicine

## 2012-09-23 ENCOUNTER — Encounter (HOSPITAL_COMMUNITY): Payer: Self-pay | Admitting: Emergency Medicine

## 2012-09-23 DIAGNOSIS — N186 End stage renal disease: Secondary | ICD-10-CM | POA: Insufficient documentation

## 2012-09-23 DIAGNOSIS — Z8679 Personal history of other diseases of the circulatory system: Secondary | ICD-10-CM | POA: Insufficient documentation

## 2012-09-23 DIAGNOSIS — W19XXXA Unspecified fall, initial encounter: Secondary | ICD-10-CM

## 2012-09-23 DIAGNOSIS — Z85528 Personal history of other malignant neoplasm of kidney: Secondary | ICD-10-CM | POA: Insufficient documentation

## 2012-09-23 DIAGNOSIS — Y9389 Activity, other specified: Secondary | ICD-10-CM | POA: Insufficient documentation

## 2012-09-23 DIAGNOSIS — I2510 Atherosclerotic heart disease of native coronary artery without angina pectoris: Secondary | ICD-10-CM | POA: Insufficient documentation

## 2012-09-23 DIAGNOSIS — Z79899 Other long term (current) drug therapy: Secondary | ICD-10-CM | POA: Insufficient documentation

## 2012-09-23 DIAGNOSIS — Z8709 Personal history of other diseases of the respiratory system: Secondary | ICD-10-CM | POA: Insufficient documentation

## 2012-09-23 DIAGNOSIS — Y92009 Unspecified place in unspecified non-institutional (private) residence as the place of occurrence of the external cause: Secondary | ICD-10-CM | POA: Insufficient documentation

## 2012-09-23 DIAGNOSIS — Z9889 Other specified postprocedural states: Secondary | ICD-10-CM | POA: Insufficient documentation

## 2012-09-23 DIAGNOSIS — Z7982 Long term (current) use of aspirin: Secondary | ICD-10-CM | POA: Insufficient documentation

## 2012-09-23 DIAGNOSIS — I509 Heart failure, unspecified: Secondary | ICD-10-CM | POA: Insufficient documentation

## 2012-09-23 DIAGNOSIS — I120 Hypertensive chronic kidney disease with stage 5 chronic kidney disease or end stage renal disease: Secondary | ICD-10-CM | POA: Insufficient documentation

## 2012-09-23 DIAGNOSIS — Z992 Dependence on renal dialysis: Secondary | ICD-10-CM | POA: Insufficient documentation

## 2012-09-23 DIAGNOSIS — S298XXA Other specified injuries of thorax, initial encounter: Secondary | ICD-10-CM | POA: Insufficient documentation

## 2012-09-23 DIAGNOSIS — W1809XA Striking against other object with subsequent fall, initial encounter: Secondary | ICD-10-CM | POA: Insufficient documentation

## 2012-09-23 DIAGNOSIS — Z8719 Personal history of other diseases of the digestive system: Secondary | ICD-10-CM | POA: Insufficient documentation

## 2012-09-23 DIAGNOSIS — I252 Old myocardial infarction: Secondary | ICD-10-CM | POA: Insufficient documentation

## 2012-09-23 DIAGNOSIS — R0781 Pleurodynia: Secondary | ICD-10-CM

## 2012-09-23 DIAGNOSIS — Z8673 Personal history of transient ischemic attack (TIA), and cerebral infarction without residual deficits: Secondary | ICD-10-CM | POA: Insufficient documentation

## 2012-09-23 MED ORDER — HYDROCODONE-ACETAMINOPHEN 5-325 MG PO TABS: 1.0000 | ORAL_TABLET | ORAL | Status: DC | PRN

## 2012-09-23 MED ORDER — OXYCODONE-ACETAMINOPHEN 5-325 MG PO TABS
2.0000 | ORAL_TABLET | Freq: Once | ORAL | Status: AC
  Administered 2012-09-23: 2 via ORAL
  Filled 2012-09-23: qty 2

## 2012-09-23 NOTE — ED Provider Notes (Signed)
History     CSN: [id number]  Arrival date & time 09/23/12  1251   First MD Initiated Contact with Patient 09/23/12 1255      Chief Complaint  Patient presents with  . Fall    (Consider location/radiation/quality/duration/timing/severity/associated sxs/prior treatment) HPI Comments: Pt states that he was working in the yard and fell on some bushes and now he is having rib pain:pt denies loc, not on blood thinners and is not having any abdominal pain  Patient is a 52 y.o. male presenting with fall. The history is provided by the patient. No language interpreter was used.  Fall The accident occurred less than 1 hour ago. He landed on concrete. There was no blood loss. Point of impact: left ribs. Pain location: left ribs. The pain is moderate. He was ambulatory at the scene. There was no entrapment after the fall. There was no drug use involved in the accident. There was no alcohol use involved in the accident. Pertinent negatives include no fever, no numbness, no abdominal pain, no vomiting, no hearing loss and no loss of consciousness. The symptoms are aggravated by activity.    Past Medical History  Diagnosis Date  . CHF (congestive heart failure)   . History of unilateral nephrectomy   . Hypertension   . Cardiomyopathy   . Gout   . Myocardial infarction ?2006  . Shortness of breath 05/19/11    "at rest, lying down, w/exertion"  . Umbilical hernia [id number]    unrepaired  . Stroke 02/2011    05/19/11 denies residual  . Dialysis patient     Mon-Wed-Fri  . Diabetes mellitus     pt. states he's borderline diabetic., no longer taking med,- off med. since 2013  . Renal cell carcinoma     dialysis- MWF  . ESRD (end stage renal disease)   . Coronary artery disease     normal coronaries by 10/10/08 cath  . History of nephrectomy 07/04/2012    History of right nephrectomy in 2002 for renal cell carcinoma     Past Surgical History  Procedure Laterality Date  . Nephrectomy  2000     right  . Av fistula placement  03/29/2011    Procedure: ARTERIOVENOUS (AV) FISTULA CREATION;  Surgeon: Hinda Lenis, MD;  Location: [facility];  Service: Vascular;  Laterality: Left;  LEFT RADIOCEPHALIC , Arteriovenous [id number])  . Smashed  1990's    "left pinky; have a plate in there"  . US echocardiography  05/20/11  . Cardiac catheterization  05/21/11  . Finger surgery      L pinkie finger- ORIF- /w remaining hardware - 1990's    . Umbilical hernia repair N/A 07/04/2012    Procedure: LAPAROSCOPIC UMBILICAL HERNIA;  Surgeon: Madilyn Hook, DO;  Location: [facility];  Service: General;  Laterality: N/A;  . Insertion of mesh N/A 07/04/2012    Procedure: INSERTION OF MESH;  Surgeon: Madilyn Hook, DO;  Location: [facility];  Service: General;  Laterality: N/A;  . Hernia repair N/A [id number]    Umbilical hernia repair    Family History  Problem Relation Age of Onset  . Hypertension Mother   . Kidney disease Mother     History  Substance Use Topics  . Smoking status: Never Smoker   . Smokeless tobacco: Never Used  . Alcohol Use: No      Review of Systems  Constitutional: Negative for fever.  Respiratory: Negative.   Cardiovascular: Negative.   Gastrointestinal: Negative for vomiting and abdominal pain.  Neurological: Negative for loss of consciousness and numbness.    Allergies  Ace inhibitors  Home Medications   Current Outpatient Rx  Name  Route  Sig  Dispense  Refill  . amLODipine (NORVASC) 10 MG tablet   Oral   Take 10 mg by mouth daily after lunch.          Marland Kitchen aspirin 325 MG tablet   Oral   Take 325 mg by mouth daily.          Marland Kitchen b complex-vitamin c-folic acid (NEPHRO-VITE) 0.8 MG TABS   Oral   Take 0.8 mg by mouth at bedtime.         . calcium acetate (PHOSLO) 667 MG capsule   Oral   Take 1,334 mg by mouth 3 (three) times daily with meals. Also takes 1-3 capsule with snacks, dose depends on what the snack is         . carvedilol (COREG) 25 MG tablet    Oral   Take 0.5 tablets (12.5 mg total) by mouth 2 (two) times daily.   1 tablet   0   . cloNIDine (CATAPRES) 0.2 MG tablet   Oral   Take 0.2 mg by mouth 3 (three) times daily.          . hydrALAZINE (APRESOLINE) 50 MG tablet   Oral   Take 50 mg by mouth 2 (two) times daily.          Marland Kitchen HYDROcodone-acetaminophen (NORCO/VICODIN) 5-325 MG per tablet   Oral   Take 1-2 tablets by mouth every 6 (six) hours as needed for pain.   30 tablet   1   . isosorbide dinitrate (ISORDIL) 20 MG tablet   Oral   Take 20 mg by mouth 2 (two) times daily.          . valsartan (DIOVAN) 320 MG tablet   Oral   Take 320 mg by mouth daily.           There were no vitals taken for this visit.  Physical Exam  Nursing note and vitals reviewed. Constitutional: He is oriented to person, place, and time. He appears well-developed and well-nourished.  HENT:  Head: Normocephalic and atraumatic.  Eyes: Conjunctivae are normal.  Cardiovascular: Normal rate and regular rhythm.   Pulmonary/Chest: Effort normal and breath sounds normal.  Pt tender on the left lateral lower ribs  Musculoskeletal:       Cervical back: Normal.       Thoracic back: Normal.       Lumbar back: Normal.  Neurological: He is alert and oriented to person, place, and time.  Skin: Skin is warm.  Psychiatric: He has a normal mood and affect.    ED Course  Procedures (including critical care time)  Labs Reviewed - No data to display Dg Ribs Unilateral W/chest Left  09/23/2012  *RADIOLOGY REPORT*  Clinical Data: Fall, rib pain  LEFT RIBS AND CHEST - 3+ VIEW  Comparison: 08/03/2012  Findings: Four views left ribs submitted.  No acute infiltrate or pulmonary edema.  Lower left rib fracture is identified.  No diagnostic pneumothorax. Cardiomegaly is noted.  Post cholecystectomy surgical clips.  IMPRESSION: No left rib fracture is identified.  No diagnostic pneumothorax.   Original Report Authenticated By: Lahoma Crocker, M.D.       1. Fall, initial encounter   2. Rib pain       MDM  No acute bony or lung abnormality noted on x-ray:pt is okay to  follow up as needed:abdomen is benign        Glendell Docker, NP 09/23/12 1453

## 2012-09-23 NOTE — ED Notes (Signed)
Per pt stated was cutting weed and his feets got dangled on the vine and he fall, when he felled , he hit his left side, stated some dizziness but denies any lost of consciousness. No notable  Bruising  noted at this time.

## 2012-09-24 NOTE — ED Provider Notes (Signed)
Medical screening examination/treatment/procedure(s) were performed by non-physician practitioner and as supervising physician I was immediately available for consultation/collaboration.  Necole Minassian R. Freyja Govea, MD 09/24/12 1103 

## 2012-10-05 ENCOUNTER — Encounter (HOSPITAL_COMMUNITY): Payer: Self-pay | Admitting: Emergency Medicine

## 2012-10-05 ENCOUNTER — Emergency Department (HOSPITAL_COMMUNITY)
Admission: EM | Admit: 2012-10-05 | Discharge: 2012-10-05 | Disposition: A | Discharge status: Home / Self Care | Payer: BC Managed Care – PPO | Attending: Emergency Medicine | Admitting: Emergency Medicine

## 2012-10-05 ENCOUNTER — Encounter (HOSPITAL_COMMUNITY): Payer: Self-pay | Admitting: *Deleted

## 2012-10-05 ENCOUNTER — Inpatient Hospital Stay (HOSPITAL_COMMUNITY)
Admission: EM | Admit: 2012-10-05 | Discharge: 2012-10-07 | DRG: 560 | Disposition: A | Discharge status: Home / Self Care | Payer: BC Managed Care – PPO | Attending: Internal Medicine | Admitting: Internal Medicine

## 2012-10-05 ENCOUNTER — Emergency Department (HOSPITAL_COMMUNITY): Payer: BC Managed Care – PPO

## 2012-10-05 ENCOUNTER — Inpatient Hospital Stay (HOSPITAL_COMMUNITY): Payer: BC Managed Care – PPO

## 2012-10-05 DIAGNOSIS — E669 Obesity, unspecified: Secondary | ICD-10-CM | POA: Diagnosis present

## 2012-10-05 DIAGNOSIS — Z6833 Body mass index (BMI) 33.0-33.9, adult: Secondary | ICD-10-CM

## 2012-10-05 DIAGNOSIS — Z8639 Personal history of other endocrine, nutritional and metabolic disease: Secondary | ICD-10-CM | POA: Insufficient documentation

## 2012-10-05 DIAGNOSIS — I2510 Atherosclerotic heart disease of native coronary artery without angina pectoris: Secondary | ICD-10-CM | POA: Insufficient documentation

## 2012-10-05 DIAGNOSIS — R531 Weakness: Secondary | ICD-10-CM | POA: Diagnosis present

## 2012-10-05 DIAGNOSIS — I252 Old myocardial infarction: Secondary | ICD-10-CM | POA: Insufficient documentation

## 2012-10-05 DIAGNOSIS — M109 Gout, unspecified: Secondary | ICD-10-CM | POA: Diagnosis present

## 2012-10-05 DIAGNOSIS — N2581 Secondary hyperparathyroidism of renal origin: Secondary | ICD-10-CM | POA: Diagnosis present

## 2012-10-05 DIAGNOSIS — D649 Anemia, unspecified: Secondary | ICD-10-CM | POA: Diagnosis present

## 2012-10-05 DIAGNOSIS — R29810 Facial weakness: Secondary | ICD-10-CM | POA: Diagnosis present

## 2012-10-05 DIAGNOSIS — E119 Type 2 diabetes mellitus without complications: Secondary | ICD-10-CM | POA: Diagnosis present

## 2012-10-05 DIAGNOSIS — Z905 Acquired absence of kidney: Secondary | ICD-10-CM

## 2012-10-05 DIAGNOSIS — I5042 Chronic combined systolic (congestive) and diastolic (congestive) heart failure: Secondary | ICD-10-CM | POA: Diagnosis present

## 2012-10-05 DIAGNOSIS — S2232XD Fracture of one rib, left side, subsequent encounter for fracture with routine healing: Secondary | ICD-10-CM

## 2012-10-05 DIAGNOSIS — I5022 Chronic systolic (congestive) heart failure: Secondary | ICD-10-CM | POA: Diagnosis present

## 2012-10-05 DIAGNOSIS — Z992 Dependence on renal dialysis: Secondary | ICD-10-CM | POA: Insufficient documentation

## 2012-10-05 DIAGNOSIS — R29898 Other symptoms and signs involving the musculoskeletal system: Principal | ICD-10-CM | POA: Diagnosis present

## 2012-10-05 DIAGNOSIS — N186 End stage renal disease: Secondary | ICD-10-CM

## 2012-10-05 DIAGNOSIS — I509 Heart failure, unspecified: Secondary | ICD-10-CM | POA: Diagnosis present

## 2012-10-05 DIAGNOSIS — G8911 Acute pain due to trauma: Secondary | ICD-10-CM | POA: Insufficient documentation

## 2012-10-05 DIAGNOSIS — I639 Cerebral infarction, unspecified: Secondary | ICD-10-CM

## 2012-10-05 DIAGNOSIS — Z85528 Personal history of other malignant neoplasm of kidney: Secondary | ICD-10-CM | POA: Insufficient documentation

## 2012-10-05 DIAGNOSIS — Z8679 Personal history of other diseases of the circulatory system: Secondary | ICD-10-CM | POA: Insufficient documentation

## 2012-10-05 DIAGNOSIS — Z79899 Other long term (current) drug therapy: Secondary | ICD-10-CM | POA: Insufficient documentation

## 2012-10-05 DIAGNOSIS — I120 Hypertensive chronic kidney disease with stage 5 chronic kidney disease or end stage renal disease: Secondary | ICD-10-CM | POA: Insufficient documentation

## 2012-10-05 DIAGNOSIS — Z7982 Long term (current) use of aspirin: Secondary | ICD-10-CM

## 2012-10-05 DIAGNOSIS — Z862 Personal history of diseases of the blood and blood-forming organs and certain disorders involving the immune mechanism: Secondary | ICD-10-CM | POA: Insufficient documentation

## 2012-10-05 DIAGNOSIS — R079 Chest pain, unspecified: Secondary | ICD-10-CM | POA: Insufficient documentation

## 2012-10-05 DIAGNOSIS — I6350 Cerebral infarction due to unspecified occlusion or stenosis of unspecified cerebral artery: Secondary | ICD-10-CM

## 2012-10-05 DIAGNOSIS — Z8673 Personal history of transient ischemic attack (TIA), and cerebral infarction without residual deficits: Secondary | ICD-10-CM | POA: Insufficient documentation

## 2012-10-05 DIAGNOSIS — Z8719 Personal history of other diseases of the digestive system: Secondary | ICD-10-CM | POA: Insufficient documentation

## 2012-10-05 DIAGNOSIS — I428 Other cardiomyopathies: Secondary | ICD-10-CM | POA: Diagnosis present

## 2012-10-05 DIAGNOSIS — Z9861 Coronary angioplasty status: Secondary | ICD-10-CM | POA: Insufficient documentation

## 2012-10-05 DIAGNOSIS — I10 Essential (primary) hypertension: Secondary | ICD-10-CM | POA: Diagnosis present

## 2012-10-05 LAB — DIFFERENTIAL
Basophils Absolute: 0 10*3/uL (ref 0.0–0.1)
Basophils Relative: 1 % (ref 0–1)
Eosinophils Absolute: 0.7 10*3/uL (ref 0.0–0.7)
Eosinophils Relative: 13 % — ABNORMAL HIGH (ref 0–5)
Lymphocytes Relative: 28 % (ref 12–46)
Lymphs Abs: 1.6 10*3/uL (ref 0.7–4.0)
Monocytes Absolute: 0.7 10*3/uL (ref 0.1–1.0)
Monocytes Relative: 12 % (ref 3–12)
Neutro Abs: 2.5 10*3/uL (ref 1.7–7.7)
Neutrophils Relative %: 46 % (ref 43–77)

## 2012-10-05 LAB — GLUCOSE, CAPILLARY
Glucose-Capillary: 81 mg/dL (ref 70–99)
Glucose-Capillary: 96 mg/dL (ref 70–99)
Glucose-Capillary: 91 mg/dL (ref 70–99)

## 2012-10-05 LAB — CBC
HCT: 36.3 % — ABNORMAL LOW (ref 39.0–52.0)
Hemoglobin: 12.3 g/dL — ABNORMAL LOW (ref 13.0–17.0)
MCH: 30.8 pg (ref 26.0–34.0)
MCHC: 33.9 g/dL (ref 30.0–36.0)
MCV: 90.8 fL (ref 78.0–100.0)
Platelets: 128 10*3/uL — ABNORMAL LOW (ref 150–400)
RBC: 4 MIL/uL — ABNORMAL LOW (ref 4.22–5.81)
RDW: 14.9 % (ref 11.5–15.5)
WBC: 5.5 10*3/uL (ref 4.0–10.5)

## 2012-10-05 LAB — ETHANOL: Alcohol, Ethyl (B): <11 mg/dL (ref 0–11)

## 2012-10-05 LAB — RAPID URINE DRUG SCREEN, HOSP PERFORMED
Amphetamines: NOT DETECTED
Barbiturates: NOT DETECTED
Benzodiazepines: NOT DETECTED
Cocaine: NOT DETECTED
Opiates: NOT DETECTED
Tetrahydrocannabinol: NOT DETECTED

## 2012-10-05 LAB — POCT I-STAT TROPONIN I: Troponin i, poc: 0.04 ng/mL (ref 0.00–0.08)

## 2012-10-05 LAB — COMPREHENSIVE METABOLIC PANEL
ALT: 12 U/L (ref 0–53)
AST: 12 U/L (ref 0–37)
Albumin: 3.4 g/dL — ABNORMAL LOW (ref 3.5–5.2)
Alkaline Phosphatase: 84 U/L (ref 39–117)
BUN: 35 mg/dL — ABNORMAL HIGH (ref 6–23)
CO2: 26 mEq/L (ref 19–32)
Calcium: 10.2 mg/dL (ref 8.4–10.5)
Chloride: 92 mEq/L — ABNORMAL LOW (ref 96–112)
Creatinine, Ser: 8.95 mg/dL — ABNORMAL HIGH (ref 0.50–1.35)
GFR calc Af Amer: 7 mL/min — ABNORMAL LOW (ref >90)
GFR calc non Af Amer: 6 mL/min — ABNORMAL LOW (ref >90)
Glucose, Bld: 80 mg/dL (ref 70–99)
Potassium: 4.9 mEq/L (ref 3.5–5.1)
Sodium: 133 mEq/L — ABNORMAL LOW (ref 135–145)
Total Bilirubin: 0.4 mg/dL (ref 0.3–1.2)
Total Protein: 8.2 g/dL (ref 6.0–8.3)

## 2012-10-05 LAB — POCT I-STAT, CHEM 8
BUN: 38 mg/dL — ABNORMAL HIGH (ref 6–23)
Calcium, Ion: 1.1 mmol/L — ABNORMAL LOW (ref 1.12–1.23)
Chloride: 101 mEq/L (ref 96–112)
Creatinine, Ser: 7.7 mg/dL — ABNORMAL HIGH (ref 0.50–1.35)
Glucose, Bld: 83 mg/dL (ref 70–99)
HCT: 38 % — ABNORMAL LOW (ref 39.0–52.0)
Hemoglobin: 12.9 g/dL — ABNORMAL LOW (ref 13.0–17.0)
Potassium: 4.8 mEq/L (ref 3.5–5.1)
Sodium: 134 mEq/L — ABNORMAL LOW (ref 135–145)
TCO2: 29 mmol/L (ref 0–100)

## 2012-10-05 LAB — PROTIME-INR
INR: 0.98 (ref 0.00–1.49)
Prothrombin Time: 12.9 seconds (ref 11.6–15.2)

## 2012-10-05 LAB — APTT: aPTT: 35 seconds (ref 24–37)

## 2012-10-05 LAB — TROPONIN I: Troponin I: <0.3 ng/mL (ref <0.30)

## 2012-10-05 MED ORDER — ISOSORBIDE DINITRATE 20 MG PO TABS
20.0000 mg | ORAL_TABLET | Freq: Two times a day (BID) | ORAL | Status: DC
  Administered 2012-10-05 – 2012-10-07 (×3): 20 mg via ORAL
  Filled 2012-10-05 (×5): qty 1

## 2012-10-05 MED ORDER — ACETAMINOPHEN 650 MG RE SUPP: 650.0000 mg | RECTAL | Status: DC | PRN

## 2012-10-05 MED ORDER — HYDRALAZINE HCL 50 MG PO TABS
50.0000 mg | ORAL_TABLET | Freq: Two times a day (BID) | ORAL | Status: DC
  Administered 2012-10-05 – 2012-10-07 (×3): 50 mg via ORAL
  Filled 2012-10-05 (×5): qty 1

## 2012-10-05 MED ORDER — SODIUM CHLORIDE 0.9 % IV SOLN: 62.5000 mg | INTRAVENOUS | Status: DC

## 2012-10-05 MED ORDER — CARVEDILOL 12.5 MG PO TABS
12.5000 mg | ORAL_TABLET | Freq: Two times a day (BID) | ORAL | Status: DC
  Administered 2012-10-05: 12.5 mg via ORAL
  Filled 2012-10-05 (×3): qty 1

## 2012-10-05 MED ORDER — CALCIUM ACETATE 667 MG PO CAPS
1334.0000 mg | ORAL_CAPSULE | Freq: Three times a day (TID) | ORAL | Status: DC
  Filled 2012-10-05 (×5): qty 2

## 2012-10-05 MED ORDER — ONDANSETRON HCL 4 MG/2ML IJ SOLN: 4.0000 mg | Freq: Four times a day (QID) | INTRAMUSCULAR | Status: DC | PRN

## 2012-10-05 MED ORDER — CLONIDINE HCL 0.2 MG PO TABS
0.2000 mg | ORAL_TABLET | Freq: Three times a day (TID) | ORAL | Status: DC
  Administered 2012-10-05: 0.2 mg via ORAL
  Filled 2012-10-05 (×5): qty 1

## 2012-10-05 MED ORDER — DOXERCALCIFEROL 4 MCG/2ML IV SOLN
2.0000 ug | INTRAVENOUS | Status: DC
  Administered 2012-10-06: 2 ug via INTRAVENOUS
  Filled 2012-10-05: qty 2

## 2012-10-05 MED ORDER — INSULIN ASPART 100 UNIT/ML ~~LOC~~ SOLN: 0.0000 [IU] | Freq: Three times a day (TID) | SUBCUTANEOUS | Status: DC

## 2012-10-05 MED ORDER — RENA-VITE PO TABS: 1.0000 | ORAL_TABLET | Freq: Every day | ORAL | Status: DC

## 2012-10-05 MED ORDER — CALCIUM ACETATE 667 MG PO CAPS
667.0000 mg | ORAL_CAPSULE | Freq: Two times a day (BID) | ORAL | Status: DC | PRN
  Filled 2012-10-05: qty 3

## 2012-10-05 MED ORDER — HYDROCODONE-ACETAMINOPHEN 5-325 MG PO TABS: 2.0000 | ORAL_TABLET | ORAL | Status: DC | PRN

## 2012-10-05 MED ORDER — ACETAMINOPHEN 325 MG PO TABS: 650.0000 mg | ORAL_TABLET | ORAL | Status: DC | PRN

## 2012-10-05 MED ORDER — CALCIUM ACETATE 667 MG PO CAPS: 667.0000 mg | ORAL_CAPSULE | Freq: Every day | ORAL | Status: DC

## 2012-10-05 MED ORDER — ASPIRIN 325 MG PO TABS
325.0000 mg | ORAL_TABLET | Freq: Every day | ORAL | Status: DC
  Administered 2012-10-05 – 2012-10-07 (×3): 325 mg via ORAL
  Filled 2012-10-05 (×3): qty 1

## 2012-10-05 MED ORDER — CALCIUM ACETATE 667 MG PO CAPS
1334.0000 mg | ORAL_CAPSULE | Freq: Three times a day (TID) | ORAL | Status: DC
  Administered 2012-10-05 – 2012-10-07 (×5): 1334 mg via ORAL
  Filled 2012-10-05 (×8): qty 2

## 2012-10-05 MED ORDER — HEPARIN SODIUM (PORCINE) 5000 UNIT/ML IJ SOLN
5000.0000 [IU] | Freq: Three times a day (TID) | INTRAMUSCULAR | Status: DC
  Administered 2012-10-05 – 2012-10-07 (×5): 5000 [IU] via SUBCUTANEOUS
  Filled 2012-10-05 (×8): qty 1

## 2012-10-05 MED ORDER — HYDROCODONE-ACETAMINOPHEN 5-325 MG PO TABS
1.0000 | ORAL_TABLET | ORAL | Status: DC | PRN
  Administered 2012-10-05 – 2012-10-06 (×2): 1 via ORAL
  Filled 2012-10-05 (×4): qty 1

## 2012-10-05 MED ORDER — AMLODIPINE BESYLATE 10 MG PO TABS
10.0000 mg | ORAL_TABLET | Freq: Every day | ORAL | Status: DC
Start: 2012-10-06 — End: 2012-10-06
  Administered 2012-10-06: 10 mg via ORAL
  Filled 2012-10-05: qty 1

## 2012-10-05 MED ORDER — RENA-VITE PO TABS
1.0000 | ORAL_TABLET | Freq: Every day | ORAL | Status: DC
  Administered 2012-10-05 – 2012-10-06 (×2): 1 via ORAL
  Filled 2012-10-05 (×3): qty 1

## 2012-10-05 NOTE — ED Notes (Signed)
Pt LSN [id number]

## 2012-10-05 NOTE — ED Notes (Signed)
Admission MD at bedside.  

## 2012-10-05 NOTE — ED Notes (Signed)
Patient fell approx 2 weeks and seen here for left sided rib pain, patient states still with pain in left ribs with movement

## 2012-10-05 NOTE — ED Provider Notes (Signed)
History     CSN: [id number]  Arrival date & time 10/05/12  1001   First MD Initiated Contact with Patient 10/05/12 1021      Chief Complaint  Patient presents with  . Numbness     The history is provided by the patient.   patient was last now normal at 9:50 AM today.  He developed acute numbness and weakness of his right hand.  His phone dropped out of his right hand.  He just left the emergency department where he is being seen for chest wall pain.  He states he was in his car at the time he turned around and came back to the emergency department.  He also reports weakness of his right lower extremity.  This is new for him.  He has a history congestive heart failure and prior stroke without residual deficits.  He is a dialysis patient and dialyzes Monday Wednesday Friday.  He is also status post nephrectomy for renal cell carcinoma.  He has a history of hypertension and diabetes but he does not smoke cigarettes.  His symptoms in his hands seem to be improving at time of my evaluation.  He still reports some right leg weakness.  No headache.  No change in his vision.  No difficulty with speech.  No other complaints.  Symptoms are mild in severity.  Nothing worsens or improves his symptoms  Past Medical History  Diagnosis Date  . CHF (congestive heart failure)   . History of unilateral nephrectomy   . Hypertension   . Cardiomyopathy   . Gout   . Myocardial infarction ?2006  . Shortness of breath 05/19/11    "at rest, lying down, w/exertion"  . Umbilical hernia [id number]    unrepaired  . Stroke 02/2011    05/19/11 denies residual  . Dialysis patient     Mon-Wed-Fri  . Diabetes mellitus     pt. states he's borderline diabetic., no longer taking med,- off med. since 2013  . Renal cell carcinoma     dialysis- MWF  . ESRD (end stage renal disease)   . Coronary artery disease     normal coronaries by 10/10/08 cath  . History of nephrectomy 07/04/2012    History of right nephrectomy in  2002 for renal cell carcinoma     Past Surgical History  Procedure Laterality Date  . Nephrectomy  2000    right  . Av fistula placement  03/29/2011    Procedure: ARTERIOVENOUS (AV) FISTULA CREATION;  Surgeon: Hinda Lenis, MD;  Location: [place];  Service: Vascular;  Laterality: Left;  LEFT RADIOCEPHALIC , Arteriovenous [id number])  . Smashed  1990's    "left pinky; have a plate in there"  . US echocardiography  05/20/11  . Cardiac catheterization  05/21/11  . Finger surgery      L pinkie finger- ORIF- /w remaining hardware - 1990's    . Umbilical hernia repair N/A 07/04/2012    Procedure: LAPAROSCOPIC UMBILICAL HERNIA;  Surgeon: Madilyn Hook, DO;  Location: [place];  Service: General;  Laterality: N/A;  . Insertion of mesh N/A 07/04/2012    Procedure: INSERTION OF MESH;  Surgeon: Madilyn Hook, DO;  Location: [place];  Service: General;  Laterality: N/A;  . Hernia repair N/A [id number]    Umbilical hernia repair    Family History  Problem Relation Age of Onset  . Hypertension Mother   . Kidney disease Mother     History  Substance Use Topics  . Smoking  status: Never Smoker   . Smokeless tobacco: Never Used  . Alcohol Use: No      Review of Systems  All other systems reviewed and are negative.    Allergies  Ace inhibitors  Home Medications   Current Outpatient Rx  Name  Route  Sig  Dispense  Refill  . amLODipine (NORVASC) 10 MG tablet   Oral   Take 10 mg by mouth daily after lunch.          Marland Kitchen aspirin 325 MG tablet   Oral   Take 325 mg by mouth daily.          Marland Kitchen b complex-vitamin c-folic acid (NEPHRO-VITE) 0.8 MG TABS   Oral   Take 0.8 mg by mouth at bedtime.         . calcium acetate (PHOSLO) 667 MG capsule   Oral   Take 667-2,001 mg by mouth 5 (five) times daily. Takes 2 capsules three times a day with meals.  Also takes 1-3 capsule with snacks, dose depends on what the snack is         . carvedilol (COREG) 25 MG tablet   Oral   Take 0.5  tablets (12.5 mg total) by mouth 2 (two) times daily.   1 tablet   0   . cloNIDine (CATAPRES) 0.2 MG tablet   Oral   Take 0.2 mg by mouth 3 (three) times daily.          . hydrALAZINE (APRESOLINE) 50 MG tablet   Oral   Take 50 mg by mouth 2 (two) times daily.          . isosorbide dinitrate (ISORDIL) 20 MG tablet   Oral   Take 20 mg by mouth 2 (two) times daily.            BP 116/79  Pulse 66  Temp(Src) 98 F (36.7 C) (Oral)  Resp 20  SpO2 99%  Physical Exam  Nursing note and vitals reviewed. Constitutional: He is oriented to person, place, and time. He appears well-developed and well-nourished.  HENT:  Head: Normocephalic and atraumatic.  Eyes: EOM are normal.  Neck: Normal range of motion.  Cardiovascular: Normal rate, regular rhythm, normal heart sounds and intact distal pulses.   Pulmonary/Chest: Effort normal and breath sounds normal. No respiratory distress.  Abdominal: Soft. He exhibits no distension. There is no tenderness.  Genitourinary: Rectum normal.  Musculoskeletal: Normal range of motion.  Neurological: He is alert and oriented to person, place, and time.  Right leg weakness as compared to left.  Normal grip strength bilaterally.  Normal bicep and tricep function and strength bilaterally.  Symmetric face  Skin: Skin is warm and dry.  Psychiatric: He has a normal mood and affect. Judgment normal.    ED Course  Procedures (including critical care time)   Date: 10/05/2012  Rate: 68  Rhythm: normal sinus rhythm  QRS Axis: normal  Intervals: normal  ST/T Wave abnormalities: normal  Conduction Disutrbances: none  Narrative Interpretation: PVC  Old EKG Reviewed: No significant changes noted     Labs Reviewed  ETHANOL  PROTIME-INR  APTT  CBC  DIFFERENTIAL  COMPREHENSIVE METABOLIC PANEL  TROPONIN I  URINE RAPID DRUG SCREEN (HOSP PERFORMED)  URINALYSIS, ROUTINE W REFLEX MICROSCOPIC   No results found. Dg Chest 2 View  10/05/2012    *RADIOLOGY REPORT*  Clinical Data: Stroke.  Previous myocardial infarct.  Recent fall with left-sided chest pain.  CHEST - 2 VIEW  Comparison: 08/03/2012  Findings: Moderate cardiomegaly and pulmonary vascular congestion are stable.  No evidence of acute infiltrate or edema.  No evidence of pleural effusion.  No mass or lymphadenopathy identified.  IMPRESSION: Stable cardiomegaly and pulmonary venous hypertension.  No active disease.   Original Report Authenticated By: Earle Gell, M.D.   Dg Ribs Unilateral W/chest Left  09/23/2012   *RADIOLOGY REPORT*  Clinical Data: Fall, rib pain  LEFT RIBS AND CHEST - 3+ VIEW  Comparison: 08/03/2012  Findings: Four views left ribs submitted.  No acute infiltrate or pulmonary edema.  Lower left rib fracture is identified.  No diagnostic pneumothorax. Cardiomegaly is noted.  Post cholecystectomy surgical clips.  IMPRESSION: No left rib fracture is identified.  No diagnostic pneumothorax.   Original Report Authenticated By: Lahoma Crocker, M.D.   Ct Head Wo Contrast  10/05/2012   *RADIOLOGY REPORT*  Clinical Data: Code stroke.  Right hand numbness.  CT HEAD WITHOUT CONTRAST  Technique:  Contiguous axial images were obtained from the base of the skull through the vertex without contrast.  Comparison: Multiple exams, including 01/18/2011  Findings: Remote lacunar infarcts of the right and left mid brain and along the posterior margin of the left putamen. Remote left external capsule lacunar infarct.  Otherwise, the brain stem, cerebellum, cerebral peduncles, thalami, basal ganglia, basilar cisterns, and ventricular system appear unremarkable.  No intracranial hemorrhage, mass lesion, or acute infarction is identified.  IMPRESSION:  1.  Remote lacunar infarcts in the brain stem, left basal ganglia, and left external capsule.  No acute intracranial findings.  I discussed this report with Dr. Leonel Ramsay at 11:29 AM on 10/05/2012.   Original Report Authenticated By: Van Clines,  M.D.    No diagnosis found.    MDM  11:11 AM Code stroke initiated at time of my evaluation.  Symptoms seem to be improving.  Stat head CT now.  Labs.  Neuro consult.        Hoy Morn, MD 10/10/12 [phone]

## 2012-10-05 NOTE — ED Notes (Signed)
Pt returned to room from CT

## 2012-10-05 NOTE — Consult Note (Signed)
[facility] Renal Consultation Note  Indication for Consultation:  Management of ESRD/hemodialysis; anemia, hypertension/volume and secondary hyperparathyroidism  HPI: [name] is a 52 y.o. male admitted with acute Right sided weakness( cva). Gives history of coming to ER for evaluation of Left rib pain sp fall about 2 weeks when "tripped on his feet"/ xray  CW " healing rib fx " he was dc home and developed sudden right sided numbness and weakness dropping phone when talking to Wife,.drove back with his left hand and walked into the ER for evaluation.He was offered a MRI, however patient stated he did not want to have any intervention even if needed. He has HO  Prior CVA( no residual( 04/2011), CM,  ESRD on MWF HD ([facility]) with DM type 2 / Right Nephrectomy 2000 sec. To Carcinoma and  last HD yesterday without problems using his AVF.Does report some fatigue type symptoms since weekend and  Rib pain but no extremity symptoms as now.   In ER with  His wife and continued numbness upper and lower ext. but slightly  better.       Past Medical History  Diagnosis Date  . CHF (congestive heart failure)   . History of unilateral nephrectomy   . Hypertension   . Cardiomyopathy   . Gout   . Myocardial infarction ?2006  . Shortness of breath 05/19/11    "at rest, lying down, w/exertion"  . Umbilical hernia [id number]    unrepaired  . Stroke 02/2011    05/19/11 denies residual  . Dialysis patient     Mon-Wed-Fri  . Diabetes mellitus     pt. states he's borderline diabetic., no longer taking med,- off med. since 2013  . Renal cell carcinoma     dialysis- MWF  . ESRD (end stage renal disease)   . Coronary artery disease     normal coronaries by 10/10/08 cath  . History of nephrectomy 07/04/2012    History of right nephrectomy in 2002 for renal cell carcinoma     Past Surgical History  Procedure Laterality Date  . Nephrectomy  2000    right  . Av fistula placement  03/29/2011     Procedure: ARTERIOVENOUS (AV) FISTULA CREATION;  Surgeon: Hinda Lenis, MD;  Location: [place];  Service: Vascular;  Laterality: Left;  LEFT RADIOCEPHALIC , Arteriovenous [id number])  . Smashed  1990's    "left pinky; have a plate in there"  . US echocardiography  05/20/11  . Cardiac catheterization  05/21/11  . Finger surgery      L pinkie finger- ORIF- /w remaining hardware - 1990's    . Umbilical hernia repair N/A 07/04/2012    Procedure: LAPAROSCOPIC UMBILICAL HERNIA;  Surgeon: Madilyn Hook, DO;  Location: [place];  Service: General;  Laterality: N/A;  . Insertion of mesh N/A 07/04/2012    Procedure: INSERTION OF MESH;  Surgeon: Madilyn Hook, DO;  Location: [place];  Service: General;  Laterality: N/A;  . Hernia repair N/A [id number]    Umbilical hernia repair      Family History  Problem Relation Age of Onset  . Hypertension Mother   . Kidney disease Mother       reports that he has never smoked. He has never used smokeless tobacco. He reports that he does not drink alcohol or use illicit drugs.   Allergies  Allergen Reactions  . Ace Inhibitors Cough    Prior to Admission medications   Medication Sig Start Date End Date Taking? Authorizing  Provider  amLODipine (NORVASC) 10 MG tablet Take 10 mg by mouth daily after lunch.    Yes Historical Provider, MD  aspirin 325 MG tablet Take 325 mg by mouth daily.    Yes Historical Provider, MD  b complex-vitamin c-folic acid (NEPHRO-VITE) 0.8 MG TABS Take 0.8 mg by mouth at bedtime.   Yes Historical Provider, MD  calcium acetate (PHOSLO) 667 MG capsule Take 667-2,001 mg by mouth 5 (five) times daily. Takes 2 capsules three times a day with meals.  Also takes 1-3 capsule with snacks, dose depends on what the snack is   Yes Historical Provider, MD  carvedilol (COREG) 25 MG tablet Take 0.5 tablets (12.5 mg total) by mouth 2 (two) times daily. 05/24/11  Yes Birdie Riddle, MD  cloNIDine (CATAPRES) 0.2 MG tablet Take 0.2 mg by mouth 3  (three) times daily.    Yes Historical Provider, MD  hydrALAZINE (APRESOLINE) 50 MG tablet Take 50 mg by mouth 2 (two) times daily.    Yes Historical Provider, MD  isosorbide dinitrate (ISORDIL) 20 MG tablet Take 20 mg by mouth 2 (two) times daily.    Yes Historical Provider, MD      Results for orders placed during the hospital encounter of 10/05/12 (from the past 48 hour(s))  GLUCOSE, CAPILLARY     Status: None   Collection Time    10/05/12 11:08 AM      Result Value Range   Glucose-Capillary 81  70 - 99 mg/dL   Comment 1 Documented in Chart     Comment 2 Notify RN    ETHANOL     Status: None   Collection Time    10/05/12 11:32 AM      Result Value Range   Alcohol, Ethyl (B) <11  0 - 11 mg/dL   Comment:            LOWEST DETECTABLE LIMIT FOR     SERUM ALCOHOL IS 11 mg/dL     FOR MEDICAL PURPOSES ONLY  PROTIME-INR     Status: None   Collection Time    10/05/12 11:32 AM      Result Value Range   Prothrombin Time 12.9  11.6 - 15.2 seconds   INR 0.98  0.00 - 1.49  APTT     Status: None   Collection Time    10/05/12 11:32 AM      Result Value Range   aPTT 35  24 - 37 seconds  CBC     Status: Abnormal   Collection Time    10/05/12 11:32 AM      Result Value Range   WBC 5.5  4.0 - 10.5 K/uL   RBC 4.00 (*) 4.22 - 5.81 MIL/uL   Hemoglobin 12.3 (*) 13.0 - 17.0 g/dL   HCT 36.3 (*) 39.0 - 52.0 %   MCV 90.8  78.0 - 100.0 fL   MCH 30.8  26.0 - 34.0 pg   MCHC 33.9  30.0 - 36.0 g/dL   RDW 14.9  11.5 - 15.5 %   Platelets 128 (*) 150 - 400 K/uL  DIFFERENTIAL     Status: Abnormal   Collection Time    10/05/12 11:32 AM      Result Value Range   Neutrophils Relative % 46  43 - 77 %   Neutro Abs 2.5  1.7 - 7.7 K/uL   Lymphocytes Relative 28  12 - 46 %   Lymphs Abs 1.6  0.7 - 4.0 K/uL  Monocytes Relative 12  3 - 12 %   Monocytes Absolute 0.7  0.1 - 1.0 K/uL   Eosinophils Relative 13 (*) 0 - 5 %   Eosinophils Absolute 0.7  0.0 - 0.7 K/uL   Basophils Relative 1  0 - 1 %    Basophils Absolute 0.0  0.0 - 0.1 K/uL  COMPREHENSIVE METABOLIC PANEL     Status: Abnormal   Collection Time    10/05/12 11:32 AM      Result Value Range   Sodium 133 (*) 135 - 145 mEq/L   Potassium 4.9  3.5 - 5.1 mEq/L   Chloride 92 (*) 96 - 112 mEq/L   CO2 26  19 - 32 mEq/L   Glucose, Bld 80  70 - 99 mg/dL   BUN 35 (*) 6 - 23 mg/dL   Creatinine, Ser 8.95 (*) 0.50 - 1.35 mg/dL   Calcium 10.2  8.4 - 10.5 mg/dL   Total Protein 8.2  6.0 - 8.3 g/dL   Albumin 3.4 (*) 3.5 - 5.2 g/dL   AST 12  0 - 37 U/L   ALT 12  0 - 53 U/L   Alkaline Phosphatase 84  39 - 117 U/L   Total Bilirubin 0.4  0.3 - 1.2 mg/dL   GFR calc non Af Amer 6 (*) >90 mL/min   GFR calc Af Amer 7 (*) >90 mL/min   Comment:            The eGFR has been calculated     using the CKD EPI equation.     This calculation has not been     validated in all clinical     situations.     eGFR's persistently     <90 mL/min signify     possible Chronic Kidney Disease.  TROPONIN I     Status: None   Collection Time    10/05/12 11:32 AM      Result Value Range   Troponin I <0.30  <0.30 ng/mL   Comment:            Due to the release kinetics of cTnI,     a negative result within the first hours     of the onset of symptoms does not rule out     myocardial infarction with certainty.     If myocardial infarction is still suspected,     repeat the test at appropriate intervals.  POCT I-STAT TROPONIN I     Status: None   Collection Time    10/05/12 11:35 AM      Result Value Range   Troponin i, poc 0.04  0.00 - 0.08 ng/mL   Comment 3            Comment: Due to the release kinetics of cTnI,     a negative result within the first hours     of the onset of symptoms does not rule out     myocardial infarction with certainty.     If myocardial infarction is still suspected,     repeat the test at appropriate intervals.  POCT I-STAT, CHEM 8     Status: Abnormal   Collection Time    10/05/12 11:37 AM      Result Value Range    Sodium 134 (*) 135 - 145 mEq/L   Potassium 4.8  3.5 - 5.1 mEq/L   Chloride 101  96 - 112 mEq/L   BUN 38 (*) 6 - 23 mg/dL  Creatinine, Ser 7.70 (*) 0.50 - 1.35 mg/dL   Glucose, Bld 83  70 - 99 mg/dL   Calcium, Ion 1.10 (*) 1.12 - 1.23 mmol/L   TCO2 29  0 - 100 mmol/L   Hemoglobin 12.9 (*) 13.0 - 17.0 g/dL   HCT 38.0 (*) 39.0 - 52.0 %  .  ROS: No fevers, chills, sweats, sob, and only pos. Other than in hpi are "gout type pain "in ankles after eating some shrimp past weekend.Since has resolved.   Physical Exam: Filed Vitals:   10/05/12 1300  BP: 121/60  Pulse: 64  Temp:   Resp: 17     General: Alert, obese BM, NAD ,Alert , appropriate HEENT: [place] with Slight facial droop, MMM Eyes: EOMI Neck: Supple, no jvd Heart: RRR, soft 1/6 sem lsb and apex/ no rub Lungs: SL . Decreased at bases otherwise CTA Abdomen: Obese, bs pos. Soft, niontender Extremities:  No pedal edema Skin: No overt rash or ulcers Neuro: OX3, Right upper and Lower ext weakness Dialysis Access: Pos. Bruit L FA AVF  Dialysis Orders: Center: [facility] on MWF . EDW 115 kg HD [place] 2.0k, 2.25 ca  Time  4hr 15 min Heparin 12,000/ then 4,000 mid tx. Access L FA AVF BFR 450 DFR 800    Hectorol 2 mcg IV/HD Epogen 1,000   Units IV/HD  Venofer  50mg  wkly hd  Other /  Assessment/Plan 1. Acute Right sided Weakness= Neurology seeing/ admit team wu in process 2. ESRD -  MWF HD [facility])  3. Hypertension/volume  -  bp in er 132/77/ vol. Appears okay/ try to avoid bp drops in HD/ noted op meds as above listed 4. Anemia  - on low dose epo as op , hold for now with hgb 12 fu am labs. On weekly  Venofer 5. Metabolic bone disease -  Hectorol on hd and phoslo binders with food/ Ca = 10.2  Fu am labs CA  And Phos 6. DM type 2= per Admit 7. HO CVA in past=was on ASA 325mg  8. HO Nephrectomy with CA  Ernest Haber, PA-C [place] [phone] 10/05/2012, 1:41 PM   I have seen and examined this patient and agree with  plan as outlined above by D. Zeyfang, PA-C.  Pt was s/p fall and had persistent CP and evaluated earlier in ED and released.  While driving home he lost function of his right hand/arm and went back to ED.  Agree with Neuro eval and will follow.  Cont with HD qMWF and await MRI, carotid dopplers, ECHO per Neuro.  Currently improved but not at baseline per pt. Macon Lesesne A,MD 10/05/2012 5:04 PM

## 2012-10-05 NOTE — ED Notes (Signed)
Code Stroke called.

## 2012-10-05 NOTE — ED Notes (Signed)
Pt was just discharged and came back d/t right right hand numbness. "I was taking a right turn and I couldn't feel the wheel with my right hand.  The feeling is coming back now."

## 2012-10-05 NOTE — ED Provider Notes (Signed)
Medical screening examination/treatment/procedure(s) were performed by non-physician practitioner and as supervising physician I was immediately available for consultation/collaboration.   Mylinda Latina III, MD 10/05/12 [phone]

## 2012-10-05 NOTE — H&P (Addendum)
[facility] History and Physical  [name] [id number] DOB: 1960/09/17 DOA: 10/05/2012  Referring physician: Jola Schmidt PCP: Maggie Font, MD  Specialists: [facility]  Chief Complaint: Right-sided weakness  HPI: [name] is a 52 y.o. African American male with past medical history of CVA, DM 2, HTN and ESRD on hemodialysis. Patient came in earlier today to the emergency department complaining about left-sided chest wall pain, patient was discharged home on while he was driving back home he felt right sided weakness and numbness. Patient said he dropped his phone while he was talking to his wife. So he drove back to the hospital, code stroke was called about his admission to the ED. Neurologist canceled this drug because of marked improvement of his symptoms, CT scan of the head was negative for bleeding, patient will be admitted to the hospital for further evaluation. Please note that patient had history of CVA documented by MRI on 01/18/2011, He is on low dose aspirin.  Review of Systems:  Constitutional: negative for anorexia, fevers and sweats Eyes: negative for irritation, redness and visual disturbance Ears, nose, mouth, throat, and face: negative for earaches, epistaxis, nasal congestion and sore throat Respiratory: negative for cough, dyspnea on exertion, sputum and wheezing Cardiovascular: negative for chest pain, dyspnea, lower extremity edema, orthopnea, palpitations and syncope Gastrointestinal: negative for abdominal pain, constipation, diarrhea, melena, nausea and vomiting Genitourinary:negative for dysuria, frequency and hematuria Hematologic/lymphatic: negative for bleeding, easy bruising and lymphadenopathy Musculoskeletal:negative for arthralgias, muscle weakness and stiff joints Neurological: negative for coordination problems, gait problems, headaches and weakness Endocrine: negative for diabetic symptoms including polydipsia, polyuria and weight  loss Allergic/Immunologic: negative for anaphylaxis, hay fever and urticaria   Past Medical History  Diagnosis Date  . CHF (congestive heart failure)   . History of unilateral nephrectomy   . Hypertension   . Cardiomyopathy   . Gout   . Myocardial infarction ?2006  . Shortness of breath 05/19/11    "at rest, lying down, w/exertion"  . Umbilical hernia [id number]    unrepaired  . Stroke 02/2011    05/19/11 denies residual  . Dialysis patient     Mon-Wed-Fri  . Diabetes mellitus     pt. states he's borderline diabetic., no longer taking med,- off med. since 2013  . Renal cell carcinoma     dialysis- MWF  . ESRD (end stage renal disease)   . Coronary artery disease     normal coronaries by 10/10/08 cath  . History of nephrectomy 07/04/2012    History of right nephrectomy in 2002 for renal cell carcinoma    Past Surgical History  Procedure Laterality Date  . Nephrectomy  2000    right  . Av fistula placement  03/29/2011    Procedure: ARTERIOVENOUS (AV) FISTULA CREATION;  Surgeon: Hinda Lenis, MD;  Location: [place];  Service: Vascular;  Laterality: Left;  LEFT RADIOCEPHALIC , Arteriovenous [id number])  . Smashed  1990's    "left pinky; have a plate in there"  . US echocardiography  05/20/11  . Cardiac catheterization  05/21/11  . Finger surgery      L pinkie finger- ORIF- /w remaining hardware - 1990's    . Umbilical hernia repair N/A 07/04/2012    Procedure: LAPAROSCOPIC UMBILICAL HERNIA;  Surgeon: Madilyn Hook, DO;  Location: [place];  Service: General;  Laterality: N/A;  . Insertion of mesh N/A 07/04/2012    Procedure: INSERTION OF MESH;  Surgeon: Madilyn Hook, DO;  Location: [place];  Service: General;  Laterality: N/A;  . Hernia repair N/A [id number]    Umbilical hernia repair   Social History:  reports that he has never smoked. He has never used smokeless tobacco. He reports that he does not drink alcohol or use illicit drugs.  he lives at home with his wife.  Ambulatory  Allergies  Allergen Reactions  . Ace Inhibitors Cough    Family History  Problem Relation Age of Onset  . Hypertension Mother   . Kidney disease Mother    Prior to Admission medications   Medication Sig Start Date End Date Taking? Authorizing Provider  amLODipine (NORVASC) 10 MG tablet Take 10 mg by mouth daily after lunch.    Yes Historical Provider, MD  aspirin 325 MG tablet Take 325 mg by mouth daily.    Yes Historical Provider, MD  b complex-vitamin c-folic acid (NEPHRO-VITE) 0.8 MG TABS Take 0.8 mg by mouth at bedtime.   Yes Historical Provider, MD  calcium acetate (PHOSLO) 667 MG capsule Take 667-2,001 mg by mouth 5 (five) times daily. Takes 2 capsules three times a day with meals.  Also takes 1-3 capsule with snacks, dose depends on what the snack is   Yes Historical Provider, MD  carvedilol (COREG) 25 MG tablet Take 0.5 tablets (12.5 mg total) by mouth 2 (two) times daily. 05/24/11  Yes Birdie Riddle, MD  cloNIDine (CATAPRES) 0.2 MG tablet Take 0.2 mg by mouth 3 (three) times daily.    Yes Historical Provider, MD  hydrALAZINE (APRESOLINE) 50 MG tablet Take 50 mg by mouth 2 (two) times daily.    Yes Historical Provider, MD  isosorbide dinitrate (ISORDIL) 20 MG tablet Take 20 mg by mouth 2 (two) times daily.    Yes Historical Provider, MD   Physical Exam: Filed Vitals:   10/05/12 1215 10/05/12 1230 10/05/12 1245 10/05/12 1300  BP: 115/84 120/81 117/60 121/60  Pulse: 46 64  64  Temp:      TempSrc:      Resp: 19 18 17 17   SpO2: 99% 100%  100%   General appearance: alert, cooperative and no distress  Head: Normocephalic, without obvious abnormality, atraumatic  Eyes: conjunctivae/corneas clear. PERRL, EOM's intact. Fundi benign.  Nose: Nares normal. Septum midline. Mucosa normal. No drainage or sinus tenderness.  Throat: lips, mucosa, and tongue normal; teeth and gums normal  Neck: Supple, no masses, no cervical lymphadenopathy, no JVD appreciated, no meningeal  signs Resp: clear to auscultation bilaterally  Chest wall: no tenderness  Cardio: regular rate and rhythm, S1, S2 normal, no murmur, click, rub or gallop  GI: soft, non-tender; bowel sounds normal; no masses, no organomegaly  Extremities: extremities normal, atraumatic, no cyanosis or edema  Skin: Skin color, texture, turgor normal. No rashes or lesions  Neurologic: Alert and oriented X 3, normal strength and tone. Normal symmetric reflexes. Normal coordination and gait  Labs on Admission:  Basic Metabolic Panel:  Recent Labs Lab 10/05/12 1132 10/05/12 1137  NA 133* 134*  K 4.9 4.8  CL 92* 101  CO2 26  --   GLUCOSE 80 83  BUN 35* 38*  CREATININE 8.95* 7.70*  CALCIUM 10.2  --    Liver Function Tests:  Recent Labs Lab 10/05/12 1132  AST 12  ALT 12  ALKPHOS 84  BILITOT 0.4  PROT 8.2  ALBUMIN 3.4*   No results found for this basename: LIPASE, AMYLASE,  in the last 168 hours No results found for this basename: AMMONIA,  in the last 168 hours  CBC:  Recent Labs Lab 10/05/12 1132 10/05/12 1137  WBC 5.5  --   NEUTROABS 2.5  --   HGB 12.3* 12.9*  HCT 36.3* 38.0*  MCV 90.8  --   PLT 128*  --    Cardiac Enzymes:  Recent Labs Lab 10/05/12 1132  TROPONINI <0.30    BNP (last 3 results)  Recent Labs  08/03/12 0752  PROBNP 9191.0*   CBG:  Recent Labs Lab 10/05/12 1108  GLUCAP 81    Radiological Exams on Admission: Ct Head Wo Contrast  10/05/2012   *RADIOLOGY REPORT*  Clinical Data: Code stroke.  Right hand numbness.  CT HEAD WITHOUT CONTRAST  Technique:  Contiguous axial images were obtained from the base of the skull through the vertex without contrast.  Comparison: Multiple exams, including 01/18/2011  Findings: Remote lacunar infarcts of the right and left mid brain and along the posterior margin of the left putamen. Remote left external capsule lacunar infarct.  Otherwise, the brain stem, cerebellum, cerebral peduncles, thalami, basal ganglia, basilar  cisterns, and ventricular system appear unremarkable.  No intracranial hemorrhage, mass lesion, or acute infarction is identified.  IMPRESSION:  1.  Remote lacunar infarcts in the brain stem, left basal ganglia, and left external capsule.  No acute intracranial findings.  I discussed this report with Dr. Leonel Ramsay at 11:29 AM on 10/05/2012.   Original Report Authenticated By: Van Clines, M.D.    EKG: Independently reviewed.   Assessment/Plan Principal Problem:   Right sided weakness Active Problems:   Systolic CHF, chronic   Hypertension, benign   DM II (diabetes mellitus, type II), controlled   End-stage renal disease on hemodialysis   History of nephrectomy   Right-sided weakness -Atypical presentation, patient has right-sided weakness he was able to drive back to the hospital. -Has multiple risk factors including systolic CHF, DM 2, HTN and previous CVAs. -Patient had MRI on 01/18/2011 showed multiple infarcts. -We will check MRI, carotid duplex and 2-D echocardiogram -Check fasting lipid profile, hemoglobin A1c, 12-lead EKG and telemetry. -Neurology to follow, patient is only on aspirin, his cardiologist is Dr. Doylene Canard if needed for recommendation for anticoagulation.  Chronic systolic CHF -Compensated chronic systolic CHF, lasted echocardiogram in last 2-D echo showed LVEF of 30-35%. -No symptoms or signs suggesting decompensation. -Continue home medications including Coreg, Imdur and hydralazine.  Hypertension -Seems to be difficult to control, patient on multiple medications. -Medications including Coreg, Imdur, hydralazine, amlodipine and clonidine.  ESRD -Patient is on hemodialysis, Monday, Wednesday and Friday through left arm AV fistula. -Spoke with Dr. Marval Regal, for routine dialysis in the a.m.  Diabetes mellitus type 2 -Diet controlled, patient has not taking any medication for diabetes at home. Check hemoglobin A1c. -Patient is going to be on a renal  diet and sliding scale of insulin, if blood sugars in the high side we will do diabetic diet.  Code Status:  Full code Family Communication:  Plan discussed with the patient with his wife at bedside. Disposition Plan:  Inpatient, your telemetry, anticipate length of stay of more than 2 mid nights  Time spent:  70 minutes  [facility] Pager [phone]  If 7PM-7AM, please contact night-coverage [email or web address] Password [email or web address] 10/05/2012, 1:24 PM

## 2012-10-05 NOTE — ED Notes (Addendum)
Code stroke called at this 67, pt transport to CT with Stroke team and this RN

## 2012-10-05 NOTE — Consult Note (Signed)
Referring Physician: Venora Maples    Chief Complaint: stroke  HPI:                                                                                                                                         [name] is an 52 y.o. male who was initially seen in ED for rib pain after fall.  He was discharged from hospital. After getting in his car to drive home, he noted his right hand and arm became weak and had decreased sensation. He walked back into the ED and explained to staff his symptoms.  Code stroke was called. Initial CT head was negative but patient continued to have right arm and leg decrease sensation. NIHSS 2.   Date last known well: 5.15.14 Time last known well: 09:50 tPA Given: No: mild symptoms and refusal   Past Medical History  Diagnosis Date  . CHF (congestive heart failure)   . History of unilateral nephrectomy   . Hypertension   . Cardiomyopathy   . Gout   . Myocardial infarction ?2006  . Shortness of breath 05/19/11    "at rest, lying down, w/exertion"  . Umbilical hernia [id number]    unrepaired  . Stroke 02/2011    05/19/11 denies residual  . Dialysis patient     Mon-Wed-Fri  . Diabetes mellitus     pt. states he's borderline diabetic., no longer taking med,- off med. since 2013  . Renal cell carcinoma     dialysis- MWF  . ESRD (end stage renal disease)   . Coronary artery disease     normal coronaries by 10/10/08 cath  . History of nephrectomy 07/04/2012    History of right nephrectomy in 2002 for renal cell carcinoma     Past Surgical History  Procedure Laterality Date  . Nephrectomy  2000    right  . Av fistula placement  03/29/2011    Procedure: ARTERIOVENOUS (AV) FISTULA CREATION;  Surgeon: Hinda Lenis, MD;  Location: [place];  Service: Vascular;  Laterality: Left;  LEFT RADIOCEPHALIC , Arteriovenous [id number])  . Smashed  1990's    "left pinky; have a plate in there"  . US echocardiography  05/20/11  . Cardiac catheterization  05/21/11  .  Finger surgery      L pinkie finger- ORIF- /w remaining hardware - 1990's    . Umbilical hernia repair N/A 07/04/2012    Procedure: LAPAROSCOPIC UMBILICAL HERNIA;  Surgeon: Madilyn Hook, DO;  Location: [place];  Service: General;  Laterality: N/A;  . Insertion of mesh N/A 07/04/2012    Procedure: INSERTION OF MESH;  Surgeon: Madilyn Hook, DO;  Location: [place];  Service: General;  Laterality: N/A;  . Hernia repair N/A [id number]    Umbilical hernia repair    Family History  Problem Relation Age of Onset  . Hypertension Mother   . Kidney disease Mother    Social  History:  reports that he has never smoked. He has never used smokeless tobacco. He reports that he does not drink alcohol or use illicit drugs.  Allergies:  Allergies  Allergen Reactions  . Ace Inhibitors Cough    Medications:                                                                                                                           No current facility-administered medications for this encounter.   Current Outpatient Prescriptions  Medication Sig Dispense Refill  . amLODipine (NORVASC) 10 MG tablet Take 10 mg by mouth daily after lunch.       Marland Kitchen aspirin 325 MG tablet Take 325 mg by mouth daily.       Marland Kitchen b complex-vitamin c-folic acid (NEPHRO-VITE) 0.8 MG TABS Take 0.8 mg by mouth at bedtime.      . calcium acetate (PHOSLO) 667 MG capsule Take 667-2,001 mg by mouth 5 (five) times daily. Takes 2 capsules three times a day with meals.  Also takes 1-3 capsule with snacks, dose depends on what the snack is      . carvedilol (COREG) 25 MG tablet Take 0.5 tablets (12.5 mg total) by mouth 2 (two) times daily.  1 tablet  0  . cloNIDine (CATAPRES) 0.2 MG tablet Take 0.2 mg by mouth 3 (three) times daily.       . hydrALAZINE (APRESOLINE) 50 MG tablet Take 50 mg by mouth 2 (two) times daily.       . isosorbide dinitrate (ISORDIL) 20 MG tablet Take 20 mg by mouth 2 (two) times daily.          ROS:                                                                                                                                        History obtained from the patient  General ROS: negative for - chills, fatigue, fever, night sweats, weight gain or weight loss Psychological ROS: negative for - behavioral disorder, hallucinations, memory difficulties, mood swings or suicidal ideation Ophthalmic ROS: negative for - blurry vision, double vision, eye pain or loss of vision ENT ROS: negative for - epistaxis, nasal discharge, oral lesions, sore throat, tinnitus or vertigo Allergy and Immunology ROS: negative for - hives or itchy/watery eyes Hematological and Lymphatic ROS: negative for - bleeding problems, bruising or swollen  lymph nodes Endocrine ROS: negative for - galactorrhea, hair pattern changes, polydipsia/polyuria or temperature intolerance Respiratory ROS: negative for - cough, hemoptysis, shortness of breath or wheezing Cardiovascular ROS: negative for - chest pain, dyspnea on exertion, edema or irregular heartbeat Gastrointestinal ROS: negative for - abdominal pain, diarrhea, hematemesis, nausea/vomiting or stool incontinence Genito-Urinary ROS: negative for - dysuria, hematuria, incontinence or urinary frequency/urgency Musculoskeletal ROS: negative for - joint swelling or muscular weakness Neurological ROS: as noted in HPI Dermatological ROS: negative for rash and skin lesion changes  Neurologic Examination:                                                                                                      Blood pressure 116/79, pulse 66, temperature 98 F (36.7 C), temperature source Oral, resp. rate 20, SpO2 99.00%.   Mental Status: Alert, oriented, thought content appropriate.  Speech fluent without evidence of aphasia.  Able to follow 3 step commands without difficulty. Cranial Nerves: II: Discs flat bilaterally; Visual fields grossly normal, pupils equal, round, reactive to light and  accommodation III,IV, VI: ptosis not present, extra-ocular motions intact bilaterally V,VII: smile symmetric, facial light touch sensation normal bilaterally VIII: hearing normal bilaterally IX,X: gag reflex present XI: bilateral shoulder shrug XII: midline tongue extension Motor: Right : Upper extremity   4/5 distally   Left:     Upper extremity   5/5  Lower extremity   4/5     Lower extremity   5/5 Tone and bulk:normal tone throughout; no atrophy noted Sensory: decreased sensation on the right leg and arm Deep Tendon Reflexes: 1+ and symmetric throughout, no AJ Plantars: Right: downgoing   Left: downgoing Cerebellar: normal finger-to-nose,  normal heel-to-shin test Gait: not tested due to medical monitors and emergent nature CV: pulses palpable throughout    Results for orders placed during the hospital encounter of 10/05/12 (from the past 48 hour(s))  GLUCOSE, CAPILLARY     Status: None   Collection Time    10/05/12 11:08 AM      Result Value Range   Glucose-Capillary 81  70 - 99 mg/dL   Comment 1 Documented in Chart     Comment 2 Notify RN    POCT I-STAT, CHEM 8     Status: Abnormal   Collection Time    10/05/12 11:37 AM      Result Value Range   Sodium 134 (*) 135 - 145 mEq/L   Potassium 4.8  3.5 - 5.1 mEq/L   Chloride 101  96 - 112 mEq/L   BUN 38 (*) 6 - 23 mg/dL   Creatinine, Ser 7.70 (*) 0.50 - 1.35 mg/dL   Glucose, Bld 83  70 - 99 mg/dL   Calcium, Ion 1.10 (*) 1.12 - 1.23 mmol/L   TCO2 29  0 - 100 mmol/L   Hemoglobin 12.9 (*) 13.0 - 17.0 g/dL   HCT 38.0 (*) 39.0 - 52.0 %   Ct Head Wo Contrast  10/05/2012   *RADIOLOGY REPORT*  Clinical Data: Code stroke.  Right hand numbness.  CT HEAD WITHOUT CONTRAST  Technique:  Contiguous axial images were obtained from the base of the skull through the vertex without contrast.  Comparison: Multiple exams, including 01/18/2011  Findings: Remote lacunar infarcts of the right and left mid brain and along the posterior margin of  the left putamen. Remote left external capsule lacunar infarct.  Otherwise, the brain stem, cerebellum, cerebral peduncles, thalami, basal ganglia, basilar cisterns, and ventricular system appear unremarkable.  No intracranial hemorrhage, mass lesion, or acute infarction is identified.  IMPRESSION:  1.  Remote lacunar infarcts in the brain stem, left basal ganglia, and left external capsule.  No acute intracranial findings.  I discussed this report with Dr. Leonel Ramsay at 11:29 AM on 10/05/2012.   Original Report Authenticated By: Van Clines, M.D.    Assessment and plan discussed with with attending physician and they are in agreement.    Etta Quill PA-C [facility] [phone]  10/05/2012, 11:41 AM  I have seen and evaluated the patient. I have reviewed the above note and made appropriate changes.    Assessment: 52 y.o. male with acute onset right-sided weakness and numbness, however due to the mild nature of the symptoms it was not felt to be disabling deficit and therefore t-PA was not offered  Stroke Risk Factors - diabetes mellitus and hypertension  Plan: 1. HgbA1c, fasting lipid panel 2. MRI, MRA  of the brain without contrast 3. PT consult, OT consult, Speech consult 4. Echocardiogram 5. Carotid dopplers 6. Prophylactic therapy-Antiplatelet med: Aspirin - dose 325mg  7. Risk factor modification 8. Telemetry monitoring 9. Frequent neuro checks   Roland Rack, MD [facility] [phone]  If 7pm- 7am, please page neurology on call at [phone].

## 2012-10-05 NOTE — ED Notes (Signed)
Initial NIH documented by Stroke RN

## 2012-10-05 NOTE — ED Notes (Signed)
Due to dialysis pt does not produce urine

## 2012-10-05 NOTE — ED Provider Notes (Signed)
History     CSN: [id number]  Arrival date & time 10/05/12  [id number]   First MD Initiated Contact with Patient 10/05/12 [phone]      Chief Complaint  Patient presents with  . Chest Pain    rib pain    (Consider location/radiation/quality/duration/timing/severity/associated sxs/prior treatment) HPI  52 year old male presents complaining of left-sided rib pain. Patient fell onto the ground when he feet got tangled 2 weeks ago, developed acute onset of pain to his left side of chest. Describe pain as a sharp stabbing sensation worsening with breathing, movement, cough or laugh.  He was seen in the ER for evaluation of the fall, xray performed at that time indicate no rib fx.  Pt received pain medication which has help but he no longer has pain but still endorse ribs pain.  No fever, productive cough, hemoptysis, SOB.  Is a M-W-F dialysis pt.    Past Medical History  Diagnosis Date  . CHF (congestive heart failure)   . History of unilateral nephrectomy   . Hypertension   . Cardiomyopathy   . Gout   . Myocardial infarction ?2006  . Shortness of breath 05/19/11    "at rest, lying down, w/exertion"  . Umbilical hernia [id number]    unrepaired  . Stroke 02/2011    05/19/11 denies residual  . Dialysis patient     Mon-Wed-Fri  . Diabetes mellitus     pt. states he's borderline diabetic., no longer taking med,- off med. since 2013  . Renal cell carcinoma     dialysis- MWF  . ESRD (end stage renal disease)   . Coronary artery disease     normal coronaries by 10/10/08 cath  . History of nephrectomy 07/04/2012    History of right nephrectomy in 2002 for renal cell carcinoma     Past Surgical History  Procedure Laterality Date  . Nephrectomy  2000    right  . Av fistula placement  03/29/2011    Procedure: ARTERIOVENOUS (AV) FISTULA CREATION;  Surgeon: Hinda Lenis, MD;  Location: [facility];  Service: Vascular;  Laterality: Left;  LEFT RADIOCEPHALIC , Arteriovenous [id number])  .  Smashed  1990's    "left pinky; have a plate in there"  . US echocardiography  05/20/11  . Cardiac catheterization  05/21/11  . Finger surgery      L pinkie finger- ORIF- /w remaining hardware - 1990's    . Umbilical hernia repair N/A 07/04/2012    Procedure: LAPAROSCOPIC UMBILICAL HERNIA;  Surgeon: Madilyn Hook, DO;  Location: No Name;  Service: General;  Laterality: N/A;  . Insertion of mesh N/A 07/04/2012    Procedure: INSERTION OF MESH;  Surgeon: Madilyn Hook, DO;  Location: [facility];  Service: General;  Laterality: N/A;  . Hernia repair N/A [id number]    Umbilical hernia repair    Family History  Problem Relation Age of Onset  . Hypertension Mother   . Kidney disease Mother     History  Substance Use Topics  . Smoking status: Never Smoker   . Smokeless tobacco: Never Used  . Alcohol Use: No      Review of Systems  Constitutional: Negative for fever.       A complete 10 system review of systems was obtained and all systems are negative except as noted in the HPI and PMH.  Respiratory: Negative for cough and shortness of breath.   Cardiovascular: Positive for chest pain. Negative for palpitations and leg swelling.  Skin: Negative for rash and  wound.  Neurological: Negative for headaches.    Allergies  Ace inhibitors  Home Medications   Current Outpatient Rx  Name  Route  Sig  Dispense  Refill  . amLODipine (NORVASC) 10 MG tablet   Oral   Take 10 mg by mouth daily after lunch.          Marland Kitchen aspirin 325 MG tablet   Oral   Take 325 mg by mouth daily.          Marland Kitchen b complex-vitamin c-folic acid (NEPHRO-VITE) 0.8 MG TABS   Oral   Take 0.8 mg by mouth at bedtime.         . calcium acetate (PHOSLO) 667 MG capsule   Oral   Take 1,334 mg by mouth 3 (three) times daily with meals. Also takes 1-3 capsule with snacks, dose depends on what the snack is         . carvedilol (COREG) 25 MG tablet   Oral   Take 0.5 tablets (12.5 mg total) by mouth 2 (two) times daily.   1  tablet   0   . cloNIDine (CATAPRES) 0.2 MG tablet   Oral   Take 0.2 mg by mouth 3 (three) times daily.          . hydrALAZINE (APRESOLINE) 50 MG tablet   Oral   Take 50 mg by mouth 2 (two) times daily.          Marland Kitchen HYDROcodone-acetaminophen (NORCO/VICODIN) 5-325 MG per tablet   Oral   Take 1-2 tablets by mouth every 6 (six) hours as needed for pain.   30 tablet   1   . HYDROcodone-acetaminophen (NORCO/VICODIN) 5-325 MG per tablet   Oral   Take 1 tablet by mouth every 4 (four) hours as needed for pain.   10 tablet   0   . isosorbide dinitrate (ISORDIL) 20 MG tablet   Oral   Take 20 mg by mouth 2 (two) times daily.            BP 119/76  Temp(Src) 97.6 F (36.4 C) (Oral)  Resp 20  SpO2 97%  Physical Exam  Nursing note and vitals reviewed. Constitutional: He appears well-developed and well-nourished. No distress.  HENT:  Head: Atraumatic.  Eyes: Conjunctivae are normal.  Neck: Neck supple. No JVD present.  Cardiovascular: Normal rate and regular rhythm.   Pulmonary/Chest: He has no wheezes. He has no rales. He exhibits tenderness (point tenderness to L lateral chest wall without crepitus, emphysemia, deformity, or overlying skin changes.).  Musculoskeletal:  AV fistular to L forearm with palpable thrills  Skin: No rash noted.    ED Course  Procedures (including critical care time)  9:21 AM Pt presents with persistent reproducible L anterio/lateral rib pain.  I reviewed prior rib xray and the radiologist mentioned L lower rib fracture in his description but did not states pt has rib fracture in his impression.  Given the point tenderness and the finding on xray, i will treat pt's symptoms of rib fracture with definitive rib fracture management including pain medication, incentive spirometer and return precaution.  Pt voice understanding and agrees with plan.    Labs Reviewed - No data to display   No results found.  DG Ribs Unilateral W/Chest Left Status:  Final result   09/23/2012      PACS Images    Show images for DG Ribs Unilateral W/Chest Left         Study Result 5/    *  RADIOLOGY REPORT*  Clinical Data: Fall, rib pain  LEFT RIBS AND CHEST - 3+ VIEW  Comparison: 08/03/2012  Findings: Four views left ribs submitted. No acute infiltrate or  pulmonary edema. Lower left rib fracture is identified. No  diagnostic pneumothorax. Cardiomegaly is noted. Post  cholecystectomy surgical clips.  IMPRESSION:  No left rib fracture is identified. No diagnostic pneumothorax.  Original Report Authenticated By: Lahoma Crocker, M.D.       1. Left rib fracture, with routine healing, subsequent encounter       MDM  BP 119/76  Temp(Src) 97.6 F (36.4 C) (Oral)  Resp 20  SpO2 97%  I have reviewed nursing notes and vital signs. I personally reviewed the imaging tests through PACS system  I reviewed available ER/hospitalization records thought the EMR         Domenic Moras, [place] 10/05/12 [id number]

## 2012-10-05 NOTE — ED Notes (Addendum)
Pt c/o headache and feeling light headed, states these symptoms developed in the last 30 min, MD notified and came to bedside, pt given juice and a snack, CBG check and WNL.

## 2012-10-06 ENCOUNTER — Encounter (HOSPITAL_COMMUNITY): Payer: Self-pay | Admitting: *Deleted

## 2012-10-06 DIAGNOSIS — M6281 Muscle weakness (generalized): Secondary | ICD-10-CM

## 2012-10-06 DIAGNOSIS — I5022 Chronic systolic (congestive) heart failure: Secondary | ICD-10-CM

## 2012-10-06 DIAGNOSIS — R209 Unspecified disturbances of skin sensation: Secondary | ICD-10-CM

## 2012-10-06 DIAGNOSIS — I509 Heart failure, unspecified: Secondary | ICD-10-CM

## 2012-10-06 LAB — BASIC METABOLIC PANEL
BUN: 50 mg/dL — ABNORMAL HIGH (ref 6–23)
CO2: 25 mEq/L (ref 19–32)
Calcium: 9.8 mg/dL (ref 8.4–10.5)
Chloride: 92 mEq/L — ABNORMAL LOW (ref 96–112)
Creatinine, Ser: 10.76 mg/dL — ABNORMAL HIGH (ref 0.50–1.35)
GFR calc Af Amer: 6 mL/min — ABNORMAL LOW (ref >90)
GFR calc non Af Amer: 5 mL/min — ABNORMAL LOW (ref >90)
Glucose, Bld: 101 mg/dL — ABNORMAL HIGH (ref 70–99)
Potassium: 4.5 mEq/L (ref 3.5–5.1)
Sodium: 134 mEq/L — ABNORMAL LOW (ref 135–145)

## 2012-10-06 LAB — LIPID PANEL
Cholesterol: 149 mg/dL (ref 0–200)
HDL: 42 mg/dL (ref >39)
LDL Cholesterol: 60 mg/dL (ref 0–99)
Total CHOL/HDL Ratio: 3.5 RATIO
Triglycerides: 233 mg/dL — ABNORMAL HIGH (ref <150)
VLDL: 47 mg/dL — ABNORMAL HIGH (ref 0–40)

## 2012-10-06 LAB — CBC
HCT: 33.2 % — ABNORMAL LOW (ref 39.0–52.0)
Hemoglobin: 11.1 g/dL — ABNORMAL LOW (ref 13.0–17.0)
MCH: 30.2 pg (ref 26.0–34.0)
MCHC: 33.4 g/dL (ref 30.0–36.0)
MCV: 90.2 fL (ref 78.0–100.0)
Platelets: 148 10*3/uL — ABNORMAL LOW (ref 150–400)
RBC: 3.68 MIL/uL — ABNORMAL LOW (ref 4.22–5.81)
RDW: 15.1 % (ref 11.5–15.5)
WBC: 5.8 10*3/uL (ref 4.0–10.5)

## 2012-10-06 LAB — HEMOGLOBIN A1C
Hgb A1c MFr Bld: 5.2 % (ref <5.7)
Mean Plasma Glucose: 103 mg/dL (ref <117)

## 2012-10-06 LAB — GLUCOSE, CAPILLARY: Glucose-Capillary: 81 mg/dL (ref 70–99)

## 2012-10-06 MED ORDER — CLONIDINE HCL 0.1 MG PO TABS
0.1000 mg | ORAL_TABLET | Freq: Two times a day (BID) | ORAL | Status: DC
  Administered 2012-10-06: 0.1 mg via ORAL
  Filled 2012-10-06 (×4): qty 1

## 2012-10-06 MED ORDER — LORAZEPAM 2 MG/ML IJ SOLN
0.5000 mg | Freq: Once | INTRAMUSCULAR | Status: AC
  Administered 2012-10-07: 0.5 mg via INTRAVENOUS
  Filled 2012-10-06: qty 1

## 2012-10-06 MED ORDER — AMLODIPINE BESYLATE 10 MG PO TABS
10.0000 mg | ORAL_TABLET | Freq: Every day | ORAL | Status: DC
Start: 2012-10-07 — End: 2012-10-07
  Filled 2012-10-06: qty 1

## 2012-10-06 MED ORDER — NEPRO/CARBSTEADY PO LIQD
237.0000 mL | ORAL | Status: DC | PRN
  Filled 2012-10-06: qty 237

## 2012-10-06 MED ORDER — CARVEDILOL 3.125 MG PO TABS
3.1250 mg | ORAL_TABLET | Freq: Two times a day (BID) | ORAL | Status: DC
  Administered 2012-10-06 – 2012-10-07 (×2): 3.125 mg via ORAL
  Filled 2012-10-06 (×5): qty 1

## 2012-10-06 NOTE — Progress Notes (Signed)
OT Cancellation Note  Patient Details Name: Huxley Engman MRN: [id number] DOB: [birth year]   Cancelled Treatment:     Pt independent with all ADLs, no further OT needs. Pt back to functional baseline.  [place] OTR/L Pager number [phone] 10/06/2012, 2:27 PM

## 2012-10-06 NOTE — Progress Notes (Signed)
Utilization Review Completed.Donne Anon T5/16/2014

## 2012-10-06 NOTE — Progress Notes (Signed)
VASCULAR LAB PRELIMINARY  PRELIMINARY  PRELIMINARY  PRELIMINARY   Carotid duplex  completed.    Preliminary report:  Bilateral:  No evidence of hemodynamically significant internal carotid artery stenosis.   Vertebral artery flow is antegrade.      Andres Escandon, RVT 10/06/2012, 4:37 PM

## 2012-10-06 NOTE — Progress Notes (Signed)
  Echocardiogram 2D Echocardiogram has been performed.  [name] 10/06/2012, 4:17 PM

## 2012-10-06 NOTE — Evaluation (Addendum)
Physical Therapy Evaluation Patient Details Name: [name] MRN: [id number] DOB: November 22, 1960 Today's Date: 10/06/2012 Time: [id number] PT Time Calculation (min): 14 min  PT Assessment / Plan / Recommendation Clinical Impression  52 y/o male adm. for R sided weakness and numbness, w/u underway for CVA/TIA. Presents to PT today back to baseline with regards to mobility. Denies numbness or weakness of right UE/LE. 22/24 DGI. No further PT needs at this time. Reinforced need for healthy lifestyle to prevent future CVA/TIA. Patient reports he walks at the track on his off days of dialysis.     PT Assessment  Patent does not need any further PT services    Follow Up Recommendations  No PT follow up    Does the patient have the potential to tolerate intense rehabilitation      Barriers to Discharge        Equipment Recommendations  None recommended by PT    Recommendations for Other Services     Frequency      Precautions / Restrictions Precautions Precautions: None Restrictions Weight Bearing Restrictions: No   Pertinent Vitals/Pain Denies pain      Mobility  Bed Mobility Bed Mobility: Not assessed Transfers Transfers: Sit to Stand;Stand to Sit Sit to Stand: 7: Independent;From chair/3-in-1 Stand to Sit: 7: Independent;To chair/3-in-1 Ambulation/Gait Ambulation/Gait Assistance: 7: Independent Ambulation Distance (Feet): 400 Feet Assistive device: None Gait Pattern: Within Functional Limits Gait velocity: WFL Stairs: Yes Stairs Assistance: 6: Modified independent (Device/Increase time) Stair Management Technique: Two rails;Alternating pattern Number of Stairs: 3 Modified Rankin (Stroke Patients Only) Pre-Morbid Rankin Score: No symptoms Modified Rankin: No symptoms        Visit Information  Last PT Received On: 10/06/12 Assistance Needed: +1    Subjective Data  Subjective: I feel back to normal. 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 difficulties    Solicitor Arousal/Alertness: Awake/alert Behavior During Therapy: WFL for tasks assessed/performed Overall Cognitive Status: Within Functional Limits for tasks assessed    Extremity/Trunk Assessment Right Upper Extremity Assessment RUE ROM/Strength/Tone: WFL for tasks assessed RUE Sensation: WFL - Light Touch RUE Coordination: WFL - gross/fine motor Left Upper Extremity Assessment LUE ROM/Strength/Tone: WFL for tasks assessed LUE Sensation: WFL - Light Touch LUE Coordination: WFL - gross/fine motor Right Lower Extremity Assessment RLE ROM/Strength/Tone: WFL for tasks assessed RLE Sensation: WFL - Light Touch RLE Coordination: WFL - gross/fine motor Left Lower Extremity Assessment LLE ROM/Strength/Tone: WFL for tasks assessed LLE Sensation: WFL - Light Touch LLE Coordination: WFL - gross/fine motor   Balance Standardized Balance Assessment Standardized Balance Assessment: Dynamic Gait Index Dynamic Gait Index Level Surface: Normal Change in Gait Speed: Normal Gait with Horizontal Head Turns: Normal Gait with Vertical Head Turns: Mild Impairment Gait and Pivot Turn: Normal Step Over Obstacle: Normal Step Around Obstacles: Normal Steps: Mild Impairment Total Score: 22  End of Session PT - End of Session Equipment Utilized During Treatment: Gait belt Activity Tolerance: Patient tolerated treatment well Patient left: in chair;with call bell/phone within reach  GP     [place] 10/06/2012, 3:38 PM

## 2012-10-06 NOTE — Progress Notes (Signed)
Nutrition Brief Note  Patient identified on the Malnutrition Screening Tool (MST) Report  Pt denies any recent weight loss, reports good appetite. Pt has been on HD x 1 year. States that he is followed by RD at HD center and has ongoing education. Pt with no questions at this time. Pt does drink Nepro sometimes. Will order Nepro PRN.   Body mass index is 34.31 kg/(m^2). Patient meets criteria for Obesity Class I based on current BMI.   Current diet order is Renal 80/90, patient is consuming approximately 100% of meals at this time. Labs and medications reviewed.   No nutrition interventions warranted at this time. If nutrition issues arise, please consult RD.   [place], [place], [place] Pager [phone] After Hours Pager

## 2012-10-06 NOTE — Progress Notes (Signed)
Subjective: Interval History: has complaints bp fell at HD.  Objective: Vital signs in last 24 hours: Temp:  [97.2 F (36.2 C)-98.1 F (36.7 C)] 97.5 F (36.4 C) (05/16 1300) Pulse Rate:  [46-74] 71 (05/16 1300) Resp:  [15-35] 16 (05/16 1118) BP: (99-137)/(58-91) 128/91 mmHg (05/16 1300) SpO2:  [1 %-100 %] 100 % (05/16 1118) Weight:  [115.6 kg (254 lb 13.6 oz)-119.75 kg (264 lb)] 115.6 kg (254 lb 13.6 oz) (05/16 1118) Weight change:   Intake/Output from previous day:   Intake/Output this shift: Total I/O In: -  Out: 1661 [Other:1661]  General appearance: alert, cooperative and moderately obese Resp: clear to auscultation bilaterally Cardio: S1, S2 normal and systolic murmur: holosystolic 2/6, blowing at apex GI: obese, pos bs, soft Extremities: edema 1+. avf  Lab Results:  Recent Labs  10/05/12 1132 10/05/12 1137 10/06/12 0410  WBC 5.5  --  5.8  HGB 12.3* 12.9* 11.1*  HCT 36.3* 38.0* 33.2*  PLT 128*  --  148*   BMET:  Recent Labs  10/05/12 1132 10/05/12 1137 10/06/12 0410  NA 133* 134* 134*  K 4.9 4.8 4.5  CL 92* 101 92*  CO2 26  --  25  GLUCOSE 80 83 101*  BUN 35* 38* 50*  CREATININE 8.95* 7.70* 10.76*  CALCIUM 10.2  --  9.8   No results found for this basename: PTH,  in the last 72 hours Iron Studies: No results found for this basename: IRON, TIBC, TRANSFERRIN, FERRITIN,  in the last 72 hours  Studies/Results: Dg Chest 2 View  10/05/2012   *RADIOLOGY REPORT*  Clinical Data: Stroke.  Previous myocardial infarct.  Recent fall with left-sided chest pain.  CHEST - 2 VIEW  Comparison: 08/03/2012  Findings: Moderate cardiomegaly and pulmonary vascular congestion are stable.  No evidence of acute infiltrate or edema.  No evidence of pleural effusion.  No mass or lymphadenopathy identified.  IMPRESSION: Stable cardiomegaly and pulmonary venous hypertension.  No active disease.   Original Report Authenticated By: Earle Gell, M.D.   Ct Head Wo  Contrast  10/05/2012   *RADIOLOGY REPORT*  Clinical Data: Code stroke.  Right hand numbness.  CT HEAD WITHOUT CONTRAST  Technique:  Contiguous axial images were obtained from the base of the skull through the vertex without contrast.  Comparison: Multiple exams, including 01/18/2011  Findings: Remote lacunar infarcts of the right and left mid brain and along the posterior margin of the left putamen. Remote left external capsule lacunar infarct.  Otherwise, the brain stem, cerebellum, cerebral peduncles, thalami, basal ganglia, basilar cisterns, and ventricular system appear unremarkable.  No intracranial hemorrhage, mass lesion, or acute infarction is identified.  IMPRESSION:  1.  Remote lacunar infarcts in the brain stem, left basal ganglia, and left external capsule.  No acute intracranial findings.  I discussed this report with Dr. Leonel Ramsay at 11:29 AM on 10/05/2012.   Original Report Authenticated By: Van Clines, M.D.    I have reviewed the patient's current medications.  Assessment/Plan: 1 ESRD had HD vol ^ but bp low with xs meds 2 HTN lower meds with lower bp 3 Anemia stable 4 DM controlled 5 TIA/CVA improved P HD MWF, lower bp meds, follow bp    LOS: 1 day   [name] L 10/06/2012,1:49 PM

## 2012-10-06 NOTE — Progress Notes (Signed)
[facility] PROGRESS NOTE  [name] [id number] DOB: [birth year] DOA: 10/05/2012 PCP: Maggie Font, MD  Assessment/Plan:  Right-sided weakness  -Atypical presentation, patient has right-sided weakness he was able to drive back to the hospital.  -Has multiple risk factors including systolic CHF, DM 2, HTN and previous CVAs.  -Patient had MRI on 01/18/2011 showed multiple infarcts.  -Await MRI, carotid duplex and 2-D echocardiogram  -asting lipid profile-total cholesterol of 149 and LDL of 60, hemoglobin A1c is within normal limits at 5.2 -Appreciate neurology input  Chronic systolic CHF  -Compensated chronic systolic CHF, lasted echocardiogram in last 2-D echo showed LVEF of 30-35%.  -No symptoms or signs suggesting decompensation.  -Continue home medications including Coreg, Imdur and hydralazine.  Hypertension  -Continue Medications including Coreg, Imdur, hydralazine, amlodipine and clonidine.  ESRD  -Patient is on hemodialysis, Monday, Wednesday and Friday through left arm AV fistula.  -Dialysis per renal Diabetes mellitus type 2  -Diet controlled, patient has not taking any medication for diabetes at home.  A1c is 5.2 as above  -continue renal diet and sliding scale of insulin    Code Status: full Family Communication: none at bedside Disposition Plan: To home when medically ready   Consultants:  Neurology  Procedures:  2-D echo pending  Antibiotics:  none  HPI/Subjective: States right upper extremity strength better, denies any other complaints.  Objective: Filed Vitals:   10/06/12 1028 10/06/12 1057 10/06/12 1118 10/06/12 1300  BP: 102/62 118/69 105/68 128/91  Pulse: 65 66 67 71  Temp:   97.5 F (36.4 C) 97.5 F (36.4 C)  TempSrc:      Resp: 23 21 16    Height:      Weight:   115.6 kg (254 lb 13.6 oz)   SpO2: 100% 100% 100%     Intake/Output Summary (Last 24 hours) at 10/06/12 1549 Last data filed at 10/06/12 1300  Gross per 24 hour   Intake    360 ml  Output   1661 ml  Net  -1301 ml   Filed Weights   10/06/12 0500 10/06/12 0647 10/06/12 1118  Weight: 119.75 kg (264 lb) 117.95 kg (260 lb 0.5 oz) 115.6 kg (254 lb 13.6 oz)    Exam:   General:  Middle aged male in no apparent distress.  Cardiovascular: Regular rate and rhythm, S1-S2  Respiratory: clear to auscultation bilaterally no crackles or wheezes  Abdomen: Soft, bowel sounds present nontender nondistended no organomegaly and no masses palpable  Extremities: No cyanosis and edema  Data Reviewed: Basic Metabolic Panel:  Recent Labs Lab 10/05/12 1132 10/05/12 1137 10/06/12 0410  NA 133* 134* 134*  K 4.9 4.8 4.5  CL 92* 101 92*  CO2 26  --  25  GLUCOSE 80 83 101*  BUN 35* 38* 50*  CREATININE 8.95* 7.70* 10.76*  CALCIUM 10.2  --  9.8   Liver Function Tests:  Recent Labs Lab 10/05/12 1132  AST 12  ALT 12  ALKPHOS 84  BILITOT 0.4  PROT 8.2  ALBUMIN 3.4*   No results found for this basename: LIPASE, AMYLASE,  in the last 168 hours No results found for this basename: AMMONIA,  in the last 168 hours CBC:  Recent Labs Lab 10/05/12 1132 10/05/12 1137 10/06/12 0410  WBC 5.5  --  5.8  NEUTROABS 2.5  --   --   HGB 12.3* 12.9* 11.1*  HCT 36.3* 38.0* 33.2*  MCV 90.8  --  90.2  PLT 128*  --  148*  Cardiac Enzymes:  Recent Labs Lab 10/05/12 1132  TROPONINI <0.30   BNP (last 3 results)  Recent Labs  08/03/12 0752  PROBNP 9191.0*   CBG:  Recent Labs Lab 10/05/12 1108 10/05/12 1348 10/05/12 1705 10/06/12 1310  GLUCAP 81 96 91 81    No results found for this or any previous visit (from the past 240 hour(s)).   Studies: Dg Chest 2 View  10/05/2012   *RADIOLOGY REPORT*  Clinical Data: Stroke.  Previous myocardial infarct.  Recent fall with left-sided chest pain.  CHEST - 2 VIEW  Comparison: 08/03/2012  Findings: Moderate cardiomegaly and pulmonary vascular congestion are stable.  No evidence of acute infiltrate or edema.   No evidence of pleural effusion.  No mass or lymphadenopathy identified.  IMPRESSION: Stable cardiomegaly and pulmonary venous hypertension.  No active disease.   Original Report Authenticated By: Earle Gell, M.D.   Ct Head Wo Contrast  10/05/2012   *RADIOLOGY REPORT*  Clinical Data: Code stroke.  Right hand numbness.  CT HEAD WITHOUT CONTRAST  Technique:  Contiguous axial images were obtained from the base of the skull through the vertex without contrast.  Comparison: Multiple exams, including 01/18/2011  Findings: Remote lacunar infarcts of the right and left mid brain and along the posterior margin of the left putamen. Remote left external capsule lacunar infarct.  Otherwise, the brain stem, cerebellum, cerebral peduncles, thalami, basal ganglia, basilar cisterns, and ventricular system appear unremarkable.  No intracranial hemorrhage, mass lesion, or acute infarction is identified.  IMPRESSION:  1.  Remote lacunar infarcts in the brain stem, left basal ganglia, and left external capsule.  No acute intracranial findings.  I discussed this report with Dr. Leonel Ramsay at 11:29 AM on 10/05/2012.   Original Report Authenticated By: Van Clines, M.D.    Scheduled Meds: . [START ON 10/07/2012] amLODipine  10 mg Oral QHS  . aspirin  325 mg Oral Daily  . calcium acetate  1,334 mg Oral TID WC  . carvedilol  3.125 mg Oral BID WC  . cloNIDine  0.1 mg Oral BID  . doxercalciferol  2 mcg Intravenous Q M,W,F-HD  . [START ON 10/11/2012] ferric gluconate (FERRLECIT/NULECIT) IV  62.5 mg Intravenous Q Wed-HD  . heparin  5,000 Units Subcutaneous Q8H  . hydrALAZINE  50 mg Oral BID  . insulin aspart  0-9 Units Subcutaneous TID WC  . isosorbide dinitrate  20 mg Oral BID  . multivitamin  1 tablet Oral QHS   Continuous Infusions:   Principal Problem:   Right sided weakness Active Problems:   Systolic CHF, chronic   Hypertension, benign   DM II (diabetes mellitus, type II), controlled   End-stage renal  disease on hemodialysis   History of nephrectomy    Time spent:25    [facility] Pager [phone]. If 7PM-7AM, please contact night-coverage at [email or web address], password [facility] 10/06/2012, 3:49 PM  LOS: 1 day

## 2012-10-06 NOTE — Progress Notes (Signed)
Stroke Team Progress Note  HISTORY [name] is an 52 y.o. male who was initially seen in ED for rib pain after fall. He was discharged from hospital. After getting in his car to drive home, he noted his right hand and arm became weak and had decreased sensation. He walked back into the ED and explained to staff his symptoms. Code stroke was called. Initial CT head was negative but patient continued to have right arm and leg decrease sensation. NIHSS 2. Patient was not a TPA candidate secondary to mild symptoms and pt refusal. He was admitted for further evaluation and treatment.  SUBJECTIVE Currently in hemodialysis. Overall he feels his condition is completely resolved.   OBJECTIVE Most recent Vital Signs: Filed Vitals:   10/06/12 0957 10/06/12 1028 10/06/12 1057 10/06/12 1118  BP: 110/68 102/62 118/69 105/68  Pulse: 64 65 66 67  Temp:    97.5 F (36.4 C)  TempSrc:      Resp: 16 23 21 16   Height:      Weight:    115.6 kg (254 lb 13.6 oz)  SpO2: 100% 100% 100% 100%   CBG (last 3)   Recent Labs  10/05/12 1108 10/05/12 1348 10/05/12 1705  GLUCAP 81 96 91    IV Fluid Intake:     MEDICATIONS  . amLODipine  10 mg Oral QPC lunch  . aspirin  325 mg Oral Daily  . calcium acetate  1,334 mg Oral TID WC  . carvedilol  12.5 mg Oral BID  . cloNIDine  0.2 mg Oral TID  . doxercalciferol  2 mcg Intravenous Q M,W,F-HD  . [START ON 10/11/2012] ferric gluconate (FERRLECIT/NULECIT) IV  62.5 mg Intravenous Q Wed-HD  . heparin  5,000 Units Subcutaneous Q8H  . hydrALAZINE  50 mg Oral BID  . insulin aspart  0-9 Units Subcutaneous TID WC  . isosorbide dinitrate  20 mg Oral BID  . multivitamin  1 tablet Oral QHS   PRN:  acetaminophen, acetaminophen, calcium acetate, feeding supplement (NEPRO CARB STEADY), HYDROcodone-acetaminophen, ondansetron (ZOFRAN) IV  Diet:  Renal thin liquids Activity:   DVT Prophylaxis:  SCDs   CLINICALLY SIGNIFICANT STUDIES Basic Metabolic Panel:  Recent  Labs Lab 10/05/12 1132 10/05/12 1137 10/06/12 0410  NA 133* 134* 134*  K 4.9 4.8 4.5  CL 92* 101 92*  CO2 26  --  25  GLUCOSE 80 83 101*  BUN 35* 38* 50*  CREATININE 8.95* 7.70* 10.76*  CALCIUM 10.2  --  9.8   Liver Function Tests:  Recent Labs Lab 10/05/12 1132  AST 12  ALT 12  ALKPHOS 84  BILITOT 0.4  PROT 8.2  ALBUMIN 3.4*   CBC:  Recent Labs Lab 10/05/12 1132 10/05/12 1137 10/06/12 0410  WBC 5.5  --  5.8  NEUTROABS 2.5  --   --   HGB 12.3* 12.9* 11.1*  HCT 36.3* 38.0* 33.2*  MCV 90.8  --  90.2  PLT 128*  --  148*   Coagulation:  Recent Labs Lab 10/05/12 1132  LABPROT 12.9  INR 0.98   Cardiac Enzymes:  Recent Labs Lab 10/05/12 1132  TROPONINI <0.30   Urinalysis: No results found for this basename: COLORURINE, APPERANCEUR, LABSPEC, PHURINE, GLUCOSEU, HGBUR, BILIRUBINUR, KETONESUR, PROTEINUR, UROBILINOGEN, NITRITE, LEUKOCYTESUR,  in the last 168 hours Lipid Panel    Component Value Date/Time   CHOL 149 10/06/2012 0410   TRIG 233* 10/06/2012 0410   HDL 42 10/06/2012 0410   CHOLHDL 3.5 10/06/2012 0410   VLDL  47* 10/06/2012 0410   LDLCALC 60 10/06/2012 0410   HgbA1C  Lab Results  Component Value Date   HGBA1C 5.2 10/06/2012    Urine Drug Screen:     Component Value Date/Time   LABOPIA NONE DETECTED 10/05/2012 1701   COCAINSCRNUR NONE DETECTED 10/05/2012 1701   LABBENZ NONE DETECTED 10/05/2012 1701   AMPHETMU NONE DETECTED 10/05/2012 1701   THCU NONE DETECTED 10/05/2012 1701   LABBARB NONE DETECTED 10/05/2012 1701    Alcohol Level:  Recent Labs Lab 10/05/12 1132  ETH <11   CT of the brain  10/05/2012    1.  Remote lacunar infarcts in the brain stem, left basal ganglia, and left external capsule.  No acute intracranial findings.   MRI of the brain    MRA of the brain    2D Echocardiogram    Carotid Doppler    CXR  10/05/2012  Stable cardiomegaly and pulmonary venous hypertension.  No active disease.  EKG  normal sinus rhythm.   Therapy  Recommendations no PT  Physical Exam   Obese young Caucasian male currently not in distress.Awake alert. Afebrile. Head is nontraumatic. Neck is supple without bruit. Hearing is normal. Cardiac exam no murmur or gallop. Lungs are clear to auscultation. Distal pulses are well felt. Neurological Exam :  Awake  Alert oriented x 3. Normal speech and language.eye movements full without nystagmus.fundi were not visualized. Vision acuity and fields appear normal. Hearing is normal. Palatal movements are normal. Face symmetric. Tongue midline. Normal strength, tone, reflexes and coordination. Normal sensation. Gait deferred. ASSESSMENT [name] is a 52 y.o. male presenting with right sided numbness and weakness. Initial imaging negative, MRI pending. Suspect left brain TIA vs small infarct. TIA/Infarct felt to be thrombotic secondary to small vessel disease.  On aspirin 325 mg orally every other day prior to admission. Now on aspirin 325 mg orally every day for secondary stroke prevention. Patient with no resultant neuro deficits. Work up underway.  Hypertension Stroke 05/19/11 - no residual sx ESRD on HD M-W-F Diabetes, HgbA1c 5.2, at goal < 7.0 CAD - MI  Hospital day # 1  TREATMENT/PLAN  Continue aspirin 325 mg orally every day for secondary stroke prevention. Encouraged pt to take every day if he can tolerate.  Complete stroke workup - f/u 2D, MRI, MRA, carotid dopplers    Burnetta Sabin, MSN, RN, ANVP-BC, ANP-BC, GNP-BC [facility] Pager: [phone] 10/06/2012 11:56 AM  I have personally obtained a history, examined the patient, evaluated imaging results, and formulated the assessment and plan of care. I agree with the above. Antony Contras, MD

## 2012-10-07 ENCOUNTER — Inpatient Hospital Stay (HOSPITAL_COMMUNITY): Payer: BC Managed Care – PPO

## 2012-10-07 ENCOUNTER — Telehealth: Payer: Self-pay | Admitting: Cardiovascular Disease

## 2012-10-07 DIAGNOSIS — I639 Cerebral infarction, unspecified: Secondary | ICD-10-CM

## 2012-10-07 DIAGNOSIS — I509 Heart failure, unspecified: Secondary | ICD-10-CM

## 2012-10-07 LAB — GLUCOSE, CAPILLARY
Glucose-Capillary: 120 mg/dL — ABNORMAL HIGH (ref 70–99)
Glucose-Capillary: 90 mg/dL (ref 70–99)
Glucose-Capillary: 114 mg/dL — ABNORMAL HIGH (ref 70–99)
Glucose-Capillary: 80 mg/dL (ref 70–99)

## 2012-10-07 MED ORDER — RENA-VITE PO TABS: 1.0000 | ORAL_TABLET | Freq: Every day | ORAL | Status: DC

## 2012-10-07 MED ORDER — CARVEDILOL 3.125 MG PO TABS: 3.1250 mg | ORAL_TABLET | Freq: Two times a day (BID) | ORAL | Status: DC

## 2012-10-07 NOTE — Progress Notes (Signed)
Pt d/c to home by car with family. Assessment stable. Prescriptions given. Pt verbalizes understanding of d/c instructions.

## 2012-10-07 NOTE — Progress Notes (Signed)
Subjective: Interval History: has complaints concern of little fluid taken off at HD yest.  Objective: Vital signs in last 24 hours: Temp:  [97.2 F (36.2 C)-98.1 F (36.7 C)] 97.8 F (36.6 C) (05/17 0600) Pulse Rate:  [61-76] 66 (05/17 0600) Resp:  [16-35] 18 (05/17 0600) BP: (99-128)/(58-91) 116/60 mmHg (05/17 0600) SpO2:  [97 %-100 %] 100 % (05/17 0600) Weight:  [115.6 kg (254 lb 13.6 oz)-117.95 kg (260 lb 0.5 oz)] 115.6 kg (254 lb 13.6 oz) (05/16 1118) Weight change: -4.15 kg (-9 lb 2.4 oz)  Intake/Output from previous day: 05/16 0701 - 05/17 0700 In: 720 [P.O.:720] Out: 1661  Intake/Output this shift:    General appearance: alert, cooperative, no distress and moderately obese Resp: diminished breath sounds bilaterally Cardio: regular rate and rhythm and systolic murmur: holosystolic 2/6, blowing at apex GI: obese, pos bs Extremities: avf  Lab Results:  Recent Labs  10/05/12 1132 10/05/12 1137 10/06/12 0410  WBC 5.5  --  5.8  HGB 12.3* 12.9* 11.1*  HCT 36.3* 38.0* 33.2*  PLT 128*  --  148*   BMET:  Recent Labs  10/05/12 1132 10/05/12 1137 10/06/12 0410  NA 133* 134* 134*  K 4.9 4.8 4.5  CL 92* 101 92*  CO2 26  --  25  GLUCOSE 80 83 101*  BUN 35* 38* 50*  CREATININE 8.95* 7.70* 10.76*  CALCIUM 10.2  --  9.8   No results found for this basename: PTH,  in the last 72 hours Iron Studies: No results found for this basename: IRON, TIBC, TRANSFERRIN, FERRITIN,  in the last 72 hours  Studies/Results: Dg Chest 2 View  10/05/2012   *RADIOLOGY REPORT*  Clinical Data: Stroke.  Previous myocardial infarct.  Recent fall with left-sided chest pain.  CHEST - 2 VIEW  Comparison: 08/03/2012  Findings: Moderate cardiomegaly and pulmonary vascular congestion are stable.  No evidence of acute infiltrate or edema.  No evidence of pleural effusion.  No mass or lymphadenopathy identified.  IMPRESSION: Stable cardiomegaly and pulmonary venous hypertension.  No active disease.    Original Report Authenticated By: Earle Gell, M.D.   Ct Head Wo Contrast  10/05/2012   *RADIOLOGY REPORT*  Clinical Data: Code stroke.  Right hand numbness.  CT HEAD WITHOUT CONTRAST  Technique:  Contiguous axial images were obtained from the base of the skull through the vertex without contrast.  Comparison: Multiple exams, including 01/18/2011  Findings: Remote lacunar infarcts of the right and left mid brain and along the posterior margin of the left putamen. Remote left external capsule lacunar infarct.  Otherwise, the brain stem, cerebellum, cerebral peduncles, thalami, basal ganglia, basilar cisterns, and ventricular system appear unremarkable.  No intracranial hemorrhage, mass lesion, or acute infarction is identified.  IMPRESSION:  1.  Remote lacunar infarcts in the brain stem, left basal ganglia, and left external capsule.  No acute intracranial findings.  I discussed this report with Dr. Leonel Ramsay at 11:29 AM on 10/05/2012.   Original Report Authenticated By: Van Clines, M.D.    I have reviewed the patient's current medications.  Assessment/Plan: ! ESRD educated on purpose of toxin vs vol and importance of maintaining bp with new cva 2 HTN low bps, will lower meds 3 Anemia stable 4 HPTH 5 Obesity 6 CVA per Neuro and primary P educate, lower bp meds. Control bs,     LOS: 2 days   Jemia Fata L 10/07/2012,6:36 AM

## 2012-10-07 NOTE — Telephone Encounter (Signed)
Phone call from Dr. Gaetano Net requesting a 30 day event monitor ( ? Cardionet).  I have given her our phone number to give to the patient.  Perhaps we can call him on Monday and arrange.  Hx of CVA  Ramond Dial., MD, [facility] 10/07/2012, 1:30 PM Office - [phone] Pager [phone]

## 2012-10-07 NOTE — Discharge Summary (Addendum)
Physician Discharge Summary  [name] [id number] DOB: 1960/07/21 DOA: 10/05/2012  PCP: Maggie Font, MD  Admit date: 10/05/2012 Discharge date: 10/07/2012  Time spent: >26minutes  Recommendations for Outpatient Follow-up:      Follow-up Information   Follow up with [facility] K, MD. (In one to 2 weeks, call for appointment upon discharge)    Contact information:   [place] [place] [address] [phone]       Please follow up. (Call [facility] Cardiology [phone] on Monday 5/19 for Cardionet monitor)     Dr. Doylene Canard  in 2 weeks, call for appointment  Discharge Diagnoses:  Principal Problem:   Right sided weakness Active Problems:   Systolic CHF, chronic   Hypertension, benign   DM II (diabetes mellitus, type II), controlled   End-stage renal disease on hemodialysis   History of nephrectomy   Discharge Condition: Improved/stable  Diet recommendation: Renal/low sodium heart healthy  Filed Weights   10/06/12 0500 10/06/12 0647 10/06/12 1118  Weight: 119.75 kg (264 lb) 117.95 kg (260 lb 0.5 oz) 115.6 kg (254 lb 13.6 oz)    History of present illness:  [name] is a 52 y.o. African American male with past medical history of CVA, DM 2, HTN and ESRD on hemodialysis. Patient came in earlier today to the emergency department complaining about left-sided chest wall pain, patient was discharged home on while he was driving back home he felt right sided weakness and numbness. Patient said he dropped his phone while he was talking to his wife. So he drove back to the hospital, code stroke was called about his admission to the ED. Neurologist canceled this drug because of marked improvement of his symptoms, CT scan of the head was negative for bleeding, patient will be admitted to the hospital for further evaluation. Please note that patient had history of CVA documented by MRI on 01/18/2011, He is on low dose aspirin.   Hospital Course:  Transient  Right-sided weakness  -Atypical presentation, patient has right-sided weakness he was able to drive back to the hospital, and this has completely resolved.  -Has multiple risk factors including systolic CHF, DM 2, HTN and previous CVAs.  -Patient had MRI on 01/18/2011 showed multiple infarcts.  -MRI was done this a.m. and  As per preliminary reading per neurology no acute findings, carotid duplex and 2-D echocardiogram with EF 25-30% -Fasting lipid profile-total cholesterol of 149 and LDL of 60, hemoglobin A1c is within normal limits at 5.2  -Per neurology OK for discharge, and CardioNet monitoring for 2-3 weeks recommended. I called cardiology-Dr. Acie Fredrickson and the office is to call patient on Monday 5/19 to have this set up(Dr. Doylene Canard is aware agrees to the monitoring per [facility]) Chronic systolic CHF  -Compensated chronic systolic CHF, lasted echocardiogram in last 2-D echo showed LVEF of 30-35%, echo done this hospital stay with EF 25-30%, he is followed by Dr. Doylene Canard -to see oupt -No symptoms or signs suggesting decompensation.  -Continue home medications including  Imdur and hydralazine, his Coreg dose was decreased to 3.125 per renal due to low blood pressures with dialysis.  Hypertension  -Continue Medications including Coreg as above, Imdur, hydralazine, amlodipine, his clonidine was stopped in the hospital, and his blood pressures have been controlled in the low 100s. His to follow up outpatient for continued monitoring of his blood pressures and adjustment of his meds as appropriate for optimal blood pressure. ESRD  -Patient is on hemodialysis, Monday, Wednesday and Friday through left  arm AV fistula.  -Dialysis per renal, his to follow up outpatient restricted.  Diabetes mellitus type 2  -Diet controlled, patient has not taking any medication for diabetes at home. A1c is 5.2 as above  -continue renal diet and sliding scale of insulin   Procedures:  MRI-preliminary reading  per neurology-negative for acute findings.   2-D echo Study Conclusions  - Left ventricle: The cavity size was severely dilated. There was moderate concentric hypertrophy. Systolic function was severely reduced. The estimated ejection fraction was in the range of 25% to 30%. There is moderate hypokinesis of the entire myocardium. - Mitral valve: Calcified annulus. Mild regurgitation. - Left atrium: The atrium was severely dilated.     Consultations:  Neurology  Renal  Cardiology via phone to set up [place] monitoring for 2-3 weeks outpatient as per neurology recommendations  Discharge Exam: Filed Vitals:   10/06/12 2300 10/07/12 0200 10/07/12 0600 10/07/12 1000  BP: 108/58 112/66 116/60 116/71  Pulse: 76 73 66 60  Temp:  98 F (36.7 C) 97.8 F (36.6 C) 97.5 F (36.4 C)  TempSrc:  Oral Oral Oral  Resp:  18 18 17   Height:      Weight:      SpO2:  98% 100% 98%    Exam:  General: Middle aged male in no apparent distress.  Cardiovascular: Regular rate and rhythm, S1-S2  Respiratory: clear to auscultation bilaterally no crackles or wheezes  Abdomen: Soft, bowel sounds present nontender nondistended no organomegaly and no masses palpable  Extremities: No cyanosis and edema  Discharge Instructions  Discharge Orders   Future Appointments Provider Department Dept Phone   10/11/2012 2:00 PM Madilyn Hook, [facility], [place] [phone]   Future Orders Complete By Expires     Diet renal 60/70-06-25-1198  As directed     Increase activity slowly  As directed         Medication List    STOP taking these medications       cloNIDine 0.2 MG tablet  Commonly known as:  CATAPRES      TAKE these medications       amLODipine 10 MG tablet  Commonly known as:  NORVASC  Take 10 mg by mouth daily after lunch.     aspirin 325 MG tablet  Take 325 mg by mouth daily.     calcium acetate 667 MG capsule  Commonly known as:  PHOSLO  Take 667-2,001 mg by mouth  5 (five) times daily. Takes 2 capsules three times a day with meals.  Also takes 1-3 capsule with snacks, dose depends on what the snack is     carvedilol 3.125 MG tablet  Commonly known as:  COREG  Take 1 tablet (3.125 mg total) by mouth 2 (two) times daily with a meal.     hydrALAZINE 50 MG tablet  Commonly known as:  APRESOLINE  Take 50 mg by mouth 2 (two) times daily.     isosorbide dinitrate 20 MG tablet  Commonly known as:  ISORDIL  Take 20 mg by mouth 2 (two) times daily.     multivitamin Tabs tablet  Take 1 tablet by mouth at bedtime.       Allergies  Allergen Reactions  . Ace Inhibitors Cough       Follow-up Information   Follow up with HILL,GERALD K, MD. (In one to 2 weeks, call for appointment upon discharge)    Contact information:   [place]  [address] [phone]       Please follow up. (Call [facility] Cardiology [phone] on Monday 5/19 for Cardionet monitor)        The results of significant diagnostics from this hospitalization (including imaging, microbiology, ancillary and laboratory) are listed below for reference.    Significant Diagnostic Studies: Dg Chest 2 View  10/05/2012   *RADIOLOGY REPORT*  Clinical Data: Stroke.  Previous myocardial infarct.  Recent fall with left-sided chest pain.  CHEST - 2 VIEW  Comparison: 08/03/2012  Findings: Moderate cardiomegaly and pulmonary vascular congestion are stable.  No evidence of acute infiltrate or edema.  No evidence of pleural effusion.  No mass or lymphadenopathy identified.  IMPRESSION: Stable cardiomegaly and pulmonary venous hypertension.  No active disease.   Original Report Authenticated By: Earle Gell, M.D.   Dg Ribs Unilateral W/chest Left  09/23/2012   *RADIOLOGY REPORT*  Clinical Data: Fall, rib pain  LEFT RIBS AND CHEST - 3+ VIEW  Comparison: 08/03/2012  Findings: Four views left ribs submitted.  No acute infiltrate or pulmonary edema.  Lower left rib fracture is  identified.  No diagnostic pneumothorax. Cardiomegaly is noted.  Post cholecystectomy surgical clips.  IMPRESSION: No left rib fracture is identified.  No diagnostic pneumothorax.   Original Report Authenticated By: Lahoma Crocker, M.D.   Ct Head Wo Contrast  10/05/2012   *RADIOLOGY REPORT*  Clinical Data: Code stroke.  Right hand numbness.  CT HEAD WITHOUT CONTRAST  Technique:  Contiguous axial images were obtained from the base of the skull through the vertex without contrast.  Comparison: Multiple exams, including 01/18/2011  Findings: Remote lacunar infarcts of the right and left mid brain and along the posterior margin of the left putamen. Remote left external capsule lacunar infarct.  Otherwise, the brain stem, cerebellum, cerebral peduncles, thalami, basal ganglia, basilar cisterns, and ventricular system appear unremarkable.  No intracranial hemorrhage, mass lesion, or acute infarction is identified.  IMPRESSION:  1.  Remote lacunar infarcts in the brain stem, left basal ganglia, and left external capsule.  No acute intracranial findings.  I discussed this report with Dr. Leonel Ramsay at 11:29 AM on 10/05/2012.   Original Report Authenticated By: Van Clines, M.D.    Microbiology: No results found for this or any previous visit (from the past 240 hour(s)).   Labs: Basic Metabolic Panel:  Recent Labs Lab 10/05/12 1132 10/05/12 1137 10/06/12 0410  NA 133* 134* 134*  K 4.9 4.8 4.5  CL 92* 101 92*  CO2 26  --  25  GLUCOSE 80 83 101*  BUN 35* 38* 50*  CREATININE 8.95* 7.70* 10.76*  CALCIUM 10.2  --  9.8   Liver Function Tests:  Recent Labs Lab 10/05/12 1132  AST 12  ALT 12  ALKPHOS 84  BILITOT 0.4  PROT 8.2  ALBUMIN 3.4*   No results found for this basename: LIPASE, AMYLASE,  in the last 168 hours No results found for this basename: AMMONIA,  in the last 168 hours CBC:  Recent Labs Lab 10/05/12 1132 10/05/12 1137 10/06/12 0410  WBC 5.5  --  5.8  NEUTROABS 2.5  --    --   HGB 12.3* 12.9* 11.1*  HCT 36.3* 38.0* 33.2*  MCV 90.8  --  90.2  PLT 128*  --  148*   Cardiac Enzymes:  Recent Labs Lab 10/05/12 1132  TROPONINI <0.30   BNP: BNP (last 3 results)  Recent Labs  08/03/12 0752  PROBNP 9191.0*   CBG:  Recent Labs Lab 10/06/12  1310 10/06/12 1649 10/06/12 2119 10/07/12 0641 10/07/12 1210  GLUCAP 81 120* 90 114* 80       Signed:  [facility] 10/07/2012, 1:39 PM

## 2012-10-07 NOTE — Progress Notes (Signed)
Stroke Team Progress Note  HISTORY [name] is a 52 y.o. male who was initially seen in ED for rib pain after a fall. He was discharged from the hospital. After getting in his car to drive home, he noted his right hand and arm became weak and had decreased sensation. He walked back into the ED and explained to staff his symptoms. Code stroke was called. Initial CT head was negative but patient continued to have right arm and leg decrease sensation. NIHSS 2. Patient was not a TPA candidate secondary to mild symptoms and pt refusal. He was admitted for further evaluation and treatment.  SUBJECTIVE No family members present this morning. The patient has no complaints. He feels he is back to baseline. He is anxious for discharge.  OBJECTIVE Most recent Vital Signs: Filed Vitals:   10/06/12 2200 10/06/12 2300 10/07/12 0200 10/07/12 0600  BP: 115/71 108/58 112/66 116/60  Pulse: 69 76 73 66  Temp: 97.6 F (36.4 C)  98 F (36.7 C) 97.8 F (36.6 C)  TempSrc: Oral  Oral Oral  Resp: 18  18 18   Height:      Weight:      SpO2: 100%  98% 100%   CBG (last 3)   Recent Labs  10/06/12 1649 10/06/12 2119 10/07/12 0641  GLUCAP 120* 90 114*    IV Fluid Intake:     MEDICATIONS  . amLODipine  10 mg Oral QHS  . aspirin  325 mg Oral Daily  . calcium acetate  1,334 mg Oral TID WC  . carvedilol  3.125 mg Oral BID WC  . doxercalciferol  2 mcg Intravenous Q M,W,F-HD  . [START ON 10/11/2012] ferric gluconate (FERRLECIT/NULECIT) IV  62.5 mg Intravenous Q Wed-HD  . heparin  5,000 Units Subcutaneous Q8H  . hydrALAZINE  50 mg Oral BID  . insulin aspart  0-9 Units Subcutaneous TID WC  . isosorbide dinitrate  20 mg Oral BID  . LORazepam  0.5 mg Intravenous Once  . multivitamin  1 tablet Oral QHS   PRN:  acetaminophen, acetaminophen, calcium acetate, feeding supplement (NEPRO CARB STEADY), HYDROcodone-acetaminophen, ondansetron (ZOFRAN) IV  Diet:  Renal thin liquids Activity:  Up with assistance  -  will change to up as tolerated. DVT Prophylaxis:  SCDs   CLINICALLY SIGNIFICANT STUDIES Basic Metabolic Panel:   Recent Labs Lab 10/05/12 1132 10/05/12 1137 10/06/12 0410  NA 133* 134* 134*  K 4.9 4.8 4.5  CL 92* 101 92*  CO2 26  --  25  GLUCOSE 80 83 101*  BUN 35* 38* 50*  CREATININE 8.95* 7.70* 10.76*  CALCIUM 10.2  --  9.8   Liver Function Tests:   Recent Labs Lab 10/05/12 1132  AST 12  ALT 12  ALKPHOS 84  BILITOT 0.4  PROT 8.2  ALBUMIN 3.4*   CBC:   Recent Labs Lab 10/05/12 1132 10/05/12 1137 10/06/12 0410  WBC 5.5  --  5.8  NEUTROABS 2.5  --   --   HGB 12.3* 12.9* 11.1*  HCT 36.3* 38.0* 33.2*  MCV 90.8  --  90.2  PLT 128*  --  148*   Coagulation:   Recent Labs Lab 10/05/12 1132  LABPROT 12.9  INR 0.98   Cardiac Enzymes:   Recent Labs Lab 10/05/12 1132  TROPONINI <0.30   Urinalysis: No results found for this basename: COLORURINE, APPERANCEUR, LABSPEC, PHURINE, GLUCOSEU, HGBUR, BILIRUBINUR, KETONESUR, PROTEINUR, UROBILINOGEN, NITRITE, LEUKOCYTESUR,  in the last 168 hours Lipid Panel    Component  Value Date/Time   CHOL 149 10/06/2012 0410   TRIG 233* 10/06/2012 0410   HDL 42 10/06/2012 0410   CHOLHDL 3.5 10/06/2012 0410   VLDL 47* 10/06/2012 0410   LDLCALC 60 10/06/2012 0410   HgbA1C  Lab Results  Component Value Date   HGBA1C 5.2 10/06/2012    Urine Drug Screen:     Component Value Date/Time   LABOPIA NONE DETECTED 10/05/2012 1701   COCAINSCRNUR NONE DETECTED 10/05/2012 1701   LABBENZ NONE DETECTED 10/05/2012 1701   AMPHETMU NONE DETECTED 10/05/2012 1701   THCU NONE DETECTED 10/05/2012 1701   LABBARB NONE DETECTED 10/05/2012 1701    Alcohol Level:   Recent Labs Lab 10/05/12 1132  ETH <11   CT of the brain  10/05/2012    1.  Remote lacunar infarcts in the brain stem, left basal ganglia, and left external capsule.  No acute intracranial findings.   MRI of the brain  Pending  MRA of the brain  Pending  2D Echocardiogram  ejection  fraction 25-30%. No other cardiac source of emboli identified.  Carotid Doppler  Preliminary - Bilateral: No evidence of hemodynamically significant internal carotid artery stenosis. Vertebral artery flow is antegrade.    CXR  10/05/2012  Stable cardiomegaly and pulmonary venous hypertension.  No active disease.  EKG  normal sinus rhythm.   Therapy Recommendations no PT  Physical Exam   Obese young Caucasian male currently not in distress.Awake alert. Afebrile. Head is nontraumatic. Neck is supple without bruit. Hearing is normal. Cardiac exam no murmur or gallop. Lungs are clear to auscultation. Distal pulses are well felt. Neurological Exam :  Awake  Alert oriented x 3. Normal speech and language.eye movements full without nystagmus.fundi were not visualized. Vision acuity and fields appear normal. Hearing is normal. Palatal movements are normal. Face symmetric. Tongue midline. Normal strength, tone, reflexes and coordination. Normal sensation. Gait - able to ambulate independently.  ASSESSMENT [name] is a 53 y.o. male presenting with right sided numbness and weakness. Initial imaging negative, MRI pending. Suspect left brain TIA vs small infarct. TIA/Infarct felt to be thrombotic secondary to small vessel disease.  On aspirin 325 mg orally every other day prior to admission. Now on aspirin 325 mg orally every day for secondary stroke prevention. Patient with no resultant neuro deficits. Work up underway.  Hypertension Stroke 05/19/11 - no residual sx ESRD on HD M-W-F Diabetes, HgbA1c 5.2, at goal < 7.0 CAD - MI  Hospital day # 2  Currently, the patient feels back to his baseline. The patient indicates a prior history of an event that affected the left side of the body. The patient was taking aspirin only every other day. The patient will go to a daily regimen of aspirin.  TREATMENT/PLAN  Continue aspirin 325 mg orally every day for secondary stroke prevention. Encouraged pt  to take every day if he can tolerate.  Complete stroke workup - f/u MRI / MRA. MRI the brain has been done, by my reading it shows no acute stroke. MRA appears to be unremarkable. Formal report is pending.  No followup therapy was recommended.  Increase activity  Given the history of bihemispheric events, would recommend CardioNet monitoring for 2 or 3 weeks following discharge.okay from neurologic standpoint for discharge.  Mikey Bussing PA-C [facility] Pager [phone] 10/07/2012, 9:18 AM  I have personally obtained a history, examined the patient, evaluated imaging results, and formulated the assessment and plan of care. I agree with the  above.  Lenor Coffin

## 2012-10-09 DIAGNOSIS — I6350 Cerebral infarction due to unspecified occlusion or stenosis of unspecified cerebral artery: Secondary | ICD-10-CM | POA: Insufficient documentation

## 2012-10-09 DIAGNOSIS — M6281 Muscle weakness (generalized): Secondary | ICD-10-CM | POA: Insufficient documentation

## 2012-10-09 NOTE — Telephone Encounter (Signed)
Order written and msg sent to [facility]

## 2012-10-09 NOTE — Addendum Note (Signed)
Addended by: Jonathon Jordan on: 10/09/2012 09:28 AM   Modules accepted: Orders

## 2012-10-10 ENCOUNTER — Encounter: Payer: Self-pay | Admitting: *Deleted

## 2012-10-10 ENCOUNTER — Encounter (INDEPENDENT_AMBULATORY_CARE_PROVIDER_SITE_OTHER): Payer: BC Managed Care – PPO

## 2012-10-10 DIAGNOSIS — R55 Syncope and collapse: Secondary | ICD-10-CM

## 2012-10-10 DIAGNOSIS — I639 Cerebral infarction, unspecified: Secondary | ICD-10-CM

## 2012-10-10 DIAGNOSIS — I509 Heart failure, unspecified: Secondary | ICD-10-CM

## 2012-10-10 DIAGNOSIS — I6789 Other cerebrovascular disease: Secondary | ICD-10-CM

## 2012-10-10 DIAGNOSIS — I517 Cardiomegaly: Secondary | ICD-10-CM

## 2012-10-10 NOTE — Progress Notes (Signed)
Patient ID: [name], male   DOB: 07/27/1960, 52 y.o.   MRN: [id number] [place] 30 day event monitor placed on patient.

## 2012-10-11 ENCOUNTER — Telehealth: Payer: Self-pay | Admitting: Internal Medicine

## 2012-10-11 ENCOUNTER — Emergency Department (HOSPITAL_COMMUNITY): Payer: BC Managed Care – PPO

## 2012-10-11 ENCOUNTER — Encounter (HOSPITAL_COMMUNITY): Payer: Self-pay | Admitting: Emergency Medicine

## 2012-10-11 ENCOUNTER — Emergency Department (HOSPITAL_COMMUNITY)
Admission: EM | Admit: 2012-10-11 | Discharge: 2012-10-12 | Disposition: A | Discharge status: Home / Self Care | Payer: BC Managed Care – PPO | Attending: Emergency Medicine | Admitting: Emergency Medicine

## 2012-10-11 ENCOUNTER — Encounter (INDEPENDENT_AMBULATORY_CARE_PROVIDER_SITE_OTHER): Payer: Self-pay | Admitting: General Surgery

## 2012-10-11 ENCOUNTER — Ambulatory Visit (INDEPENDENT_AMBULATORY_CARE_PROVIDER_SITE_OTHER): Payer: BC Managed Care – PPO | Admitting: General Surgery

## 2012-10-11 VITALS — BP 138/84 | HR 74 | Temp 97.3°F | Resp 18 | Ht 73.0 in | Wt 253.8 lb

## 2012-10-11 DIAGNOSIS — Z79899 Other long term (current) drug therapy: Secondary | ICD-10-CM | POA: Insufficient documentation

## 2012-10-11 DIAGNOSIS — I252 Old myocardial infarction: Secondary | ICD-10-CM | POA: Insufficient documentation

## 2012-10-11 DIAGNOSIS — M109 Gout, unspecified: Secondary | ICD-10-CM | POA: Insufficient documentation

## 2012-10-11 DIAGNOSIS — Z8673 Personal history of transient ischemic attack (TIA), and cerebral infarction without residual deficits: Secondary | ICD-10-CM | POA: Insufficient documentation

## 2012-10-11 DIAGNOSIS — Z8719 Personal history of other diseases of the digestive system: Secondary | ICD-10-CM | POA: Insufficient documentation

## 2012-10-11 DIAGNOSIS — IMO0002 Reserved for concepts with insufficient information to code with codable children: Secondary | ICD-10-CM

## 2012-10-11 DIAGNOSIS — Z85528 Personal history of other malignant neoplasm of kidney: Secondary | ICD-10-CM | POA: Insufficient documentation

## 2012-10-11 DIAGNOSIS — I959 Hypotension, unspecified: Secondary | ICD-10-CM | POA: Insufficient documentation

## 2012-10-11 DIAGNOSIS — I120 Hypertensive chronic kidney disease with stage 5 chronic kidney disease or end stage renal disease: Secondary | ICD-10-CM | POA: Insufficient documentation

## 2012-10-11 DIAGNOSIS — Z7982 Long term (current) use of aspirin: Secondary | ICD-10-CM | POA: Insufficient documentation

## 2012-10-11 DIAGNOSIS — I509 Heart failure, unspecified: Secondary | ICD-10-CM | POA: Insufficient documentation

## 2012-10-11 DIAGNOSIS — I2510 Atherosclerotic heart disease of native coronary artery without angina pectoris: Secondary | ICD-10-CM | POA: Insufficient documentation

## 2012-10-11 DIAGNOSIS — N186 End stage renal disease: Secondary | ICD-10-CM | POA: Insufficient documentation

## 2012-10-11 DIAGNOSIS — Z9889 Other specified postprocedural states: Secondary | ICD-10-CM | POA: Insufficient documentation

## 2012-10-11 DIAGNOSIS — R5381 Other malaise: Secondary | ICD-10-CM | POA: Insufficient documentation

## 2012-10-11 DIAGNOSIS — Z992 Dependence on renal dialysis: Secondary | ICD-10-CM | POA: Insufficient documentation

## 2012-10-11 DIAGNOSIS — R4182 Altered mental status, unspecified: Secondary | ICD-10-CM | POA: Insufficient documentation

## 2012-10-11 DIAGNOSIS — Z905 Acquired absence of kidney: Secondary | ICD-10-CM | POA: Insufficient documentation

## 2012-10-11 DIAGNOSIS — R5383 Other fatigue: Secondary | ICD-10-CM | POA: Insufficient documentation

## 2012-10-11 LAB — CBC WITH DIFFERENTIAL/PLATELET
Basophils Absolute: 0 10*3/uL (ref 0.0–0.1)
Basophils Relative: 1 % (ref 0–1)
Eosinophils Absolute: 0.7 10*3/uL (ref 0.0–0.7)
Eosinophils Relative: 13 % — ABNORMAL HIGH (ref 0–5)
HCT: 34.3 % — ABNORMAL LOW (ref 39.0–52.0)
Hemoglobin: 11.7 g/dL — ABNORMAL LOW (ref 13.0–17.0)
Lymphocytes Relative: 27 % (ref 12–46)
Lymphs Abs: 1.5 10*3/uL (ref 0.7–4.0)
MCH: 31 pg (ref 26.0–34.0)
MCHC: 34.1 g/dL (ref 30.0–36.0)
MCV: 91 fL (ref 78.0–100.0)
Monocytes Absolute: 0.6 10*3/uL (ref 0.1–1.0)
Monocytes Relative: 12 % (ref 3–12)
Neutro Abs: 2.6 10*3/uL (ref 1.7–7.7)
Neutrophils Relative %: 48 % (ref 43–77)
Platelets: 162 10*3/uL (ref 150–400)
RBC: 3.77 MIL/uL — ABNORMAL LOW (ref 4.22–5.81)
RDW: 15.2 % (ref 11.5–15.5)
WBC: 5.4 10*3/uL (ref 4.0–10.5)

## 2012-10-11 LAB — GLUCOSE, CAPILLARY: Glucose-Capillary: 96 mg/dL (ref 70–99)

## 2012-10-11 NOTE — Telephone Encounter (Signed)
Received a call from [place] [facility] stating that the pt with sinus and PVCs on his monitor, but they called the pt and he is reporting hypotension BP 60/50.  Called pt and talked to his wife, they are already in check in at [facility] ED.

## 2012-10-11 NOTE — ED Provider Notes (Signed)
History     CSN: [id number]  Arrival date & time 10/11/12  2212   First MD Initiated Contact with Patient 10/11/12 2258      Chief Complaint  Patient presents with  . Shortness of Breath  . Weakness    (Consider location/radiation/quality/duration/timing/severity/associated sxs/prior treatment) HPI Comments: 52 year old male with a history of congestive heart failure, end-stage renal disease, nephrectomy secondary to renal cell carcinoma in 2002. He dialyzes Monday Wednesday and Fridays, attended dialysis today without complications but states that he was significantly fatigued when he returned from dialysis. He has felt fatigued all along, prior to arrival the patient was noted to be severely fatigued and lethargic by his wife took his blood pressure and found it to be less than 60 systolic. She immediately brought him to the hospital. He was able to ambulate from the car into the waiting room but states that he is having a hard time staying awake. He is unable to give much more information secondary to his waxing and waning mental status. Level V caveat apply secondary to mental status change.  Patient is a 52 y.o. male presenting with shortness of breath and weakness. The history is provided by the patient, the spouse and medical records.  Shortness of Breath Weakness Associated symptoms include shortness of breath.    Past Medical History  Diagnosis Date  . CHF (congestive heart failure)   . History of unilateral nephrectomy   . Hypertension   . Cardiomyopathy   . Gout   . Myocardial infarction ?2006  . Shortness of breath 05/19/11    "at rest, lying down, w/exertion"  . Umbilical hernia [id number]    unrepaired  . Stroke 02/2011    05/19/11 denies residual  . Dialysis patient     Mon-Wed-Fri  . Diabetes mellitus     pt. states he's borderline diabetic., no longer taking med,- off med. since 2013  . Renal cell carcinoma     dialysis- MWF  . ESRD (end stage renal disease)    . Coronary artery disease     normal coronaries by 10/10/08 cath  . History of nephrectomy 07/04/2012    History of right nephrectomy in 2002 for renal cell carcinoma     Past Surgical History  Procedure Laterality Date  . Nephrectomy  2000    right  . Av fistula placement  03/29/2011    Procedure: ARTERIOVENOUS (AV) FISTULA CREATION;  Surgeon: Hinda Lenis, MD;  Location: [place];  Service: Vascular;  Laterality: Left;  LEFT RADIOCEPHALIC , Arteriovenous [id number])  . Smashed  1990's    "left pinky; have a plate in there"  . US echocardiography  05/20/11  . Cardiac catheterization  05/21/11  . Finger surgery      L pinkie finger- ORIF- /w remaining hardware - 1990's    . Umbilical hernia repair N/A 07/04/2012    Procedure: LAPAROSCOPIC UMBILICAL HERNIA;  Surgeon: Madilyn Hook, DO;  Location: [place];  Service: General;  Laterality: N/A;  . Insertion of mesh N/A 07/04/2012    Procedure: INSERTION OF MESH;  Surgeon: Madilyn Hook, DO;  Location: [place];  Service: General;  Laterality: N/A;  . Hernia repair N/A [id number]    Umbilical hernia repair    Family History  Problem Relation Age of Onset  . Hypertension Mother   . Kidney disease Mother     History  Substance Use Topics  . Smoking status: Never Smoker   . Smokeless tobacco: Never Used  . Alcohol Use:  No      Review of Systems  Unable to perform ROS: Mental status change  Respiratory: Positive for shortness of breath.   Neurological: Positive for weakness.    Allergies  Ace inhibitors  Home Medications   Current Outpatient Rx  Name  Route  Sig  Dispense  Refill  . amLODipine (NORVASC) 10 MG tablet   Oral   Take 10 mg by mouth daily after lunch.          Marland Kitchen aspirin 325 MG tablet   Oral   Take 325 mg by mouth daily.          . calcium acetate (PHOSLO) 667 MG capsule   Oral   Take 667-2,001 mg by mouth 5 (five) times daily. Takes 2 capsules three times a day with meals.  Also takes 1-3 capsule  with snacks, dose depends on what the snack is         . carvedilol (COREG) 3.125 MG tablet   Oral   Take 1 tablet (3.125 mg total) by mouth 2 (two) times daily with a meal.   60 tablet   0   . hydrALAZINE (APRESOLINE) 50 MG tablet   Oral   Take 50 mg by mouth 2 (two) times daily.          . isosorbide dinitrate (ISORDIL) 20 MG tablet   Oral   Take 20 mg by mouth 2 (two) times daily.          . multivitamin (RENA-VIT) TABS tablet   Oral   Take 1 tablet by mouth at bedtime.   30 tablet   0     BP 76/47  Pulse 62  Temp(Src) 98.3 F (36.8 C) (Oral)  Resp 12  SpO2 96%  Physical Exam  Nursing note and vitals reviewed. Constitutional: He appears well-developed and well-nourished. No distress.  HENT:  Head: Normocephalic and atraumatic.  Mouth/Throat: Oropharynx is clear and moist. No oropharyngeal exudate.  Eyes: Conjunctivae and EOM are normal. Pupils are equal, round, and reactive to light. Right eye exhibits no discharge. Left eye exhibits no discharge. No scleral icterus.  Neck: Normal range of motion. Neck supple. No JVD present. No thyromegaly present.  Cardiovascular: Normal rate, regular rhythm, normal heart sounds and intact distal pulses.  Exam reveals no gallop and no friction rub.   No murmur heard. Pulmonary/Chest: Effort normal and breath sounds normal. No respiratory distress. He has no wheezes. He has no rales.  Abdominal: Soft. Bowel sounds are normal. He exhibits no distension and no mass. There is no tenderness.  Musculoskeletal: Normal range of motion. He exhibits no edema and no tenderness.  Lymphadenopathy:    He has no cervical adenopathy.  Neurological: He is alert. Coordination normal.  No facial droop, cranial nerves III through XII are intact, speech is soft, patient appears mildly somnolent, arousable and follows commands, strength is 5 out of 5 in all 4 extremities when the patient wakes up. No significant limb ataxia  Skin: Skin is warm  and dry. No rash noted. No erythema.  Psychiatric: He has a normal mood and affect. His behavior is normal.    ED Course  Procedures (including critical care time)  Labs Reviewed  CBC WITH DIFFERENTIAL - Abnormal; Notable for the following:    RBC 3.77 (*)    Hemoglobin 11.7 (*)    HCT 34.3 (*)    Eosinophils Relative 13 (*)    All other components within normal limits  CULTURE, BLOOD (ROUTINE  X 2)  CULTURE, BLOOD (ROUTINE X 2)  GLUCOSE, CAPILLARY  COMPREHENSIVE METABOLIC PANEL  PRO B NATRIURETIC PEPTIDE   Dg Chest 1 View  10/12/2012   *RADIOLOGY REPORT*  Clinical Data: Chest pain.  End-stage renal disease on dialysis. Diabetes.  Gout.  CHEST - 1 VIEW  Comparison: 10/05/2012  Findings: Cardiomegaly stable.  Both lungs are clear.  IMPRESSION: Stable cardiomegaly.  No active lung disease.   Original Report Authenticated By: Earle Gell, M.D.   Ct Head Wo Contrast  10/12/2012   *RADIOLOGY REPORT*  Clinical Data: Severe headache.  Weakness.  Hypotension.  CT HEAD WITHOUT CONTRAST  Technique:  Contiguous axial images were obtained from the base of the skull through the vertex without contrast.  Comparison: 10/05/2012  Findings: There is no evidence of intracranial hemorrhage, brain edema or other signs of acute infarction.  There is no evidence of intracranial mass lesion or mass effect.  No abnormal extra-axial fluid collections are identified.  Ventricles are normal in size.  Mild chronic small vessel disease is again demonstrated as well as old lacunar infarcts involving the medulla, pons, and left lentiform nucleus. No skull abnormality identified.  IMPRESSION:  1.  No acute intracranial findings. 2.  Mild chronic small vessel disease and old lacunar infarcts.   Original Report Authenticated By: Earle Gell, M.D.     1. Hypotension       MDM   the patient is hypotensive, last blood pressure was 78 systolic, he is not tachycardic, not febrile and has clear heart and lung sounds. Would  be concerned for several etiologies for hypotension including abnormal electrolytes, infection, pneumonia, would also be concerned for recurrent stroke or hemorrhagic transformation of prior stroke. CT scan, chest x-ray, labs.  ED ECG REPORT  I personally interpreted this EKG   Date: 10/12/2012   Rate: 75  Rhythm: normal sinus rhythm  QRS Axis: normal  Intervals: normal  ST/T Wave abnormalities: nonspecific T wave changes  Conduction Disutrbances:nonspecific intraventricular conduction delay  Narrative Interpretation: Voltage criteria for LVH  Old EKG Reviewed: Compared with 10/05/2012, occasional ectopy still seen, LVH dosing. No other significant changes  I have personally reviewed the CT scan and the chest x-ray and find her to be no signs of infiltrate or new stroke. Lab work shows essentially normal blood counts, normal electrolytes, baseline creatinine, no anion gap and a normal lactic acid 1.9. Troponin is normal, patient has improved spontaneously and now has a normal mental status, normal blood pressure of 118/60       Johnna Acosta, MD 10/12/12 [phone]

## 2012-10-11 NOTE — Progress Notes (Signed)
Subjective:     Patient ID: [name], male   DOB: 15-Aug-1960, 52 y.o.   MRN: [id number]  HPI F/u 3 months s/p lap umbilical hernia repair.  He initially had a seroma but his bulge has improved.  He he is eating fine and his bowels are functioning normally. His pain is resolved. He has no complaints. He was recently in the hospital because he had a stroke but he feels great and has no complaints.  Review of Systems     Objective:   Physical Exam He is sitting comfortable in a chair in no acute distress His abdomen is soft and nontender on exam there is no evidence of any recurrent hernia or evidence of any recurrent or persistent seroma at the umbilicus. He still has some hardness under his largest trocar site in the left abdomen but this does not change with Valsalva and does not appear to be any hernia. He does have some bruising in his abdominal wall from his recent hospital stay    Assessment:     Status post microscopic repair of umbilical hernia-doing well He seems to be doing very well and has not complaints. His postoperative stroma seems to have improved completely     Plan:     He can gradually increase activity as tolerated and follow up with me when necessary

## 2012-10-11 NOTE — ED Notes (Addendum)
PT. REPORTS SOB WITH GENERALIZED WEAKNESS ONSET TODAY , HEMODIALYSIS MON / WED/ FRI, PT. STATED HE HAD A STROKE LAST Thursday . HYPOTENSIVE AT TRIAGE , ALERT AND ORIENTED , SPEECH CLEAR , NO FACIAL ASYMMETRY.  EQUAL GRIPS WITH NO ARM DRIFT.

## 2012-10-11 NOTE — ED Notes (Signed)
Pt. States had dialysis this AM, after dialysis pt. Felt tired. States after dinner tonight lethargy and weakness increased. Pt. Wife took bp at home and states systolic was [id number]. Pt. Hospitalized on Thursday for stroke. Also has broken right on left side.

## 2012-10-12 LAB — CG4 I-STAT (LACTIC ACID): Lactic Acid, Venous: 1.93 mmol/L (ref 0.5–2.2)

## 2012-10-12 LAB — POCT I-STAT, CHEM 8
BUN: 31 mg/dL — ABNORMAL HIGH (ref 6–23)
Calcium, Ion: 1.19 mmol/L (ref 1.12–1.23)
Chloride: 99 mEq/L (ref 96–112)
Creatinine, Ser: 6.5 mg/dL — ABNORMAL HIGH (ref 0.50–1.35)
Glucose, Bld: 92 mg/dL (ref 70–99)
HCT: 37 % — ABNORMAL LOW (ref 39.0–52.0)
Hemoglobin: 12.6 g/dL — ABNORMAL LOW (ref 13.0–17.0)
Potassium: 4.3 mEq/L (ref 3.5–5.1)
Sodium: 137 mEq/L (ref 135–145)
TCO2: 34 mmol/L (ref 0–100)

## 2012-10-12 LAB — COMPREHENSIVE METABOLIC PANEL
ALT: 21 U/L (ref 0–53)
AST: 17 U/L (ref 0–37)
Albumin: 3.5 g/dL (ref 3.5–5.2)
Alkaline Phosphatase: 83 U/L (ref 39–117)
BUN: 31 mg/dL — ABNORMAL HIGH (ref 6–23)
CO2: 30 mEq/L (ref 19–32)
Calcium: 9.9 mg/dL (ref 8.4–10.5)
Chloride: 95 mEq/L — ABNORMAL LOW (ref 96–112)
Creatinine, Ser: 7.32 mg/dL — ABNORMAL HIGH (ref 0.50–1.35)
GFR calc Af Amer: 9 mL/min — ABNORMAL LOW (ref >90)
GFR calc non Af Amer: 8 mL/min — ABNORMAL LOW (ref >90)
Glucose, Bld: 101 mg/dL — ABNORMAL HIGH (ref 70–99)
Potassium: 4.4 mEq/L (ref 3.5–5.1)
Sodium: 138 mEq/L (ref 135–145)
Total Bilirubin: 0.3 mg/dL (ref 0.3–1.2)
Total Protein: 7.9 g/dL (ref 6.0–8.3)

## 2012-10-12 LAB — PRO B NATRIURETIC PEPTIDE: Pro B Natriuretic peptide (BNP): 2851 pg/mL — ABNORMAL HIGH (ref 0–125)

## 2012-10-12 LAB — POCT I-STAT TROPONIN I: Troponin i, poc: 0.05 ng/mL (ref 0.00–0.08)

## 2012-10-12 NOTE — ED Notes (Signed)
Pt. Alert and oriented x4. States "I feel a lot better". Reports that he will continue to check bp tonight. Pt. Ambulatory.

## 2012-10-12 NOTE — ED Notes (Addendum)
I-stat G4 lactic acid: 1.93  I-stat Troponin: 0.05  I-stat Chem 8: Na:  137 K:  4.3 Cl:  99 ICa:  1.19 TCO2:  34  Glu:  92 BUN:  31 Creat:  6.5 Hct:  37% Hbg: 12.6 AnGap: 9

## 2012-11-15 ENCOUNTER — Encounter: Payer: Self-pay | Admitting: *Deleted

## 2012-11-15 NOTE — Telephone Encounter (Signed)
This encounter was created in error - please disregard.

## 2012-11-16 ENCOUNTER — Telehealth: Payer: Self-pay | Admitting: *Deleted

## 2012-11-16 NOTE — Telephone Encounter (Signed)
ecardio results given/ nsr, occ pvc's pac.

## 2012-11-27 ENCOUNTER — Encounter: Payer: Self-pay | Admitting: Vascular Surgery

## 2012-11-28 ENCOUNTER — Encounter: Payer: Self-pay | Admitting: Vascular Surgery

## 2012-11-28 ENCOUNTER — Ambulatory Visit (INDEPENDENT_AMBULATORY_CARE_PROVIDER_SITE_OTHER): Payer: No Typology Code available for payment source | Admitting: Vascular Surgery

## 2012-11-28 VITALS — BP 136/88 | HR 83 | Resp 18 | Ht 76.0 in | Wt 259.0 lb

## 2012-11-28 DIAGNOSIS — N186 End stage renal disease: Secondary | ICD-10-CM

## 2012-11-28 NOTE — Progress Notes (Signed)
The patient presents today for evaluation of his left forearm radiocephalic fistula. This was placed by Dr. Bridgett Larsson in November of 2012. He has had one revision with ligating several competing branches since that time. He has had a good use of this. He does report some tenderness over the vein near the wrist. He does have some superficial ulcerations over the fistula as well. He reports good flow with no high pressures. As was his first access.  Past Medical History  Diagnosis Date  . CHF (congestive heart failure)   . History of unilateral nephrectomy   . Hypertension   . Cardiomyopathy   . Gout   . Myocardial infarction ?2006  . Shortness of breath 05/19/11    "at rest, lying down, w/exertion"  . Umbilical hernia [id number]    unrepaired  . Stroke 02/2011    05/19/11 denies residual  . Dialysis patient     Mon-Wed-Fri  . Diabetes mellitus     pt. states he's borderline diabetic., no longer taking med,- off med. since 2013  . Renal cell carcinoma     dialysis- MWF  . ESRD (end stage renal disease)   . Coronary artery disease     normal coronaries by 10/10/08 cath  . History of nephrectomy 07/04/2012    History of right nephrectomy in 2002 for renal cell carcinoma     History  Substance Use Topics  . Smoking status: Never Smoker   . Smokeless tobacco: Never Used  . Alcohol Use: No    Family History  Problem Relation Age of Onset  . Hypertension Mother   . Kidney disease Mother     Allergies  Allergen Reactions  . Ace Inhibitors Cough    Current outpatient prescriptions:amLODipine (NORVASC) 10 MG tablet, Take 10 mg by mouth daily after lunch. , Disp: , Rfl: ;  aspirin 325 MG tablet, Take 325 mg by mouth daily. , Disp: , Rfl: ;  calcium acetate (PHOSLO) 667 MG capsule, Take 667-2,001 mg by mouth 5 (five) times daily. Takes 2 capsules three times a day with meals.  Also takes 1-3 capsule with snacks, dose depends on what the snack is, Disp: , Rfl:  carvedilol (COREG) 3.125 MG  tablet, Take 1 tablet (3.125 mg total) by mouth 2 (two) times daily with a meal., Disp: 60 tablet, Rfl: 0;  hydrALAZINE (APRESOLINE) 50 MG tablet, Take 50 mg by mouth 2 (two) times daily. , Disp: , Rfl: ;  isosorbide dinitrate (ISORDIL) 20 MG tablet, Take 20 mg by mouth 2 (two) times daily. , Disp: , Rfl: ;  multivitamin (RENA-VIT) TABS tablet, Take 1 tablet by mouth at bedtime., Disp: 30 tablet, Rfl: 0  BP 136/88  Pulse 83  Resp 18  Ht 6\' 4"  (1.93 m)  Wt 259 lb (117.482 kg)  BMI 31.54 kg/m2  Body mass index is 31.54 kg/(m^2).       Visible exam: Well-developed well-nourished gentleman in no acute distress Neurologically he was grossly intact Extremities felt to deformity. He does have palpable radial pulses bilaterally with a left radiocephalic fistula. He does have good maturation and good size of his fistula. There are 2 areas where the dialysis staff and then accessing this repeatedly. There does not appear to be much rotation of sites. He does have a superficial excoriation of the more distal portion of these 2 sites. This does not appear to be deepened down towards the vein. He does have some tenderness in the cephalic vein distal to this between the  access area and the AV anastomosis. There is no erythema  Impression and plan tenderness over his cephalic vein fistula. I explained that this is not any particular treatment for this. He will try a topical anesthetic. I do feel that is important to have better rotation of his access sites to prevent ulceration over the sites are being used repeatedly. He was comfortable with this discussion will see [place] again on an as-needed basis

## 2012-12-05 ENCOUNTER — Other Ambulatory Visit: Payer: Self-pay | Admitting: Internal Medicine

## 2012-12-05 NOTE — Telephone Encounter (Signed)
MEDICATION REFILL 

## 2013-04-24 ENCOUNTER — Emergency Department (HOSPITAL_COMMUNITY)
Admission: EM | Admit: 2013-04-24 | Discharge: 2013-04-24 | Disposition: A | Discharge status: Home / Self Care | Payer: No Typology Code available for payment source | Attending: Emergency Medicine | Admitting: Emergency Medicine

## 2013-04-24 ENCOUNTER — Emergency Department (HOSPITAL_COMMUNITY): Payer: No Typology Code available for payment source

## 2013-04-24 ENCOUNTER — Encounter (HOSPITAL_COMMUNITY): Payer: Self-pay | Admitting: Emergency Medicine

## 2013-04-24 DIAGNOSIS — Z7982 Long term (current) use of aspirin: Secondary | ICD-10-CM | POA: Insufficient documentation

## 2013-04-24 DIAGNOSIS — I509 Heart failure, unspecified: Secondary | ICD-10-CM | POA: Insufficient documentation

## 2013-04-24 DIAGNOSIS — I252 Old myocardial infarction: Secondary | ICD-10-CM | POA: Diagnosis not present

## 2013-04-24 DIAGNOSIS — I959 Hypotension, unspecified: Secondary | ICD-10-CM | POA: Diagnosis not present

## 2013-04-24 DIAGNOSIS — I120 Hypertensive chronic kidney disease with stage 5 chronic kidney disease or end stage renal disease: Secondary | ICD-10-CM | POA: Diagnosis not present

## 2013-04-24 DIAGNOSIS — Z95818 Presence of other cardiac implants and grafts: Secondary | ICD-10-CM | POA: Diagnosis not present

## 2013-04-24 DIAGNOSIS — Z8719 Personal history of other diseases of the digestive system: Secondary | ICD-10-CM | POA: Insufficient documentation

## 2013-04-24 DIAGNOSIS — R404 Transient alteration of awareness: Secondary | ICD-10-CM | POA: Insufficient documentation

## 2013-04-24 DIAGNOSIS — N186 End stage renal disease: Secondary | ICD-10-CM | POA: Diagnosis not present

## 2013-04-24 DIAGNOSIS — R55 Syncope and collapse: Secondary | ICD-10-CM

## 2013-04-24 DIAGNOSIS — I2510 Atherosclerotic heart disease of native coronary artery without angina pectoris: Secondary | ICD-10-CM | POA: Diagnosis not present

## 2013-04-24 DIAGNOSIS — E119 Type 2 diabetes mellitus without complications: Secondary | ICD-10-CM | POA: Diagnosis not present

## 2013-04-24 DIAGNOSIS — E875 Hyperkalemia: Secondary | ICD-10-CM | POA: Insufficient documentation

## 2013-04-24 DIAGNOSIS — R61 Generalized hyperhidrosis: Secondary | ICD-10-CM | POA: Insufficient documentation

## 2013-04-24 DIAGNOSIS — Z992 Dependence on renal dialysis: Secondary | ICD-10-CM | POA: Diagnosis not present

## 2013-04-24 DIAGNOSIS — Z79899 Other long term (current) drug therapy: Secondary | ICD-10-CM | POA: Diagnosis not present

## 2013-04-24 DIAGNOSIS — R42 Dizziness and giddiness: Secondary | ICD-10-CM | POA: Diagnosis not present

## 2013-04-24 DIAGNOSIS — Z9089 Acquired absence of other organs: Secondary | ICD-10-CM | POA: Insufficient documentation

## 2013-04-24 DIAGNOSIS — H538 Other visual disturbances: Secondary | ICD-10-CM | POA: Diagnosis not present

## 2013-04-24 DIAGNOSIS — Z85528 Personal history of other malignant neoplasm of kidney: Secondary | ICD-10-CM | POA: Insufficient documentation

## 2013-04-24 DIAGNOSIS — Z8673 Personal history of transient ischemic attack (TIA), and cerebral infarction without residual deficits: Secondary | ICD-10-CM | POA: Insufficient documentation

## 2013-04-24 LAB — PRO B NATRIURETIC PEPTIDE: Pro B Natriuretic peptide (BNP): 2306 pg/mL — ABNORMAL HIGH (ref 0–125)

## 2013-04-24 LAB — POCT I-STAT, CHEM 8
BUN: 60 mg/dL — ABNORMAL HIGH (ref 6–23)
Calcium, Ion: 1.16 mmol/L (ref 1.12–1.23)
Chloride: 99 mEq/L (ref 96–112)
Creatinine, Ser: 13.8 mg/dL — ABNORMAL HIGH (ref 0.50–1.35)
Glucose, Bld: 98 mg/dL (ref 70–99)
HCT: 43 % (ref 39.0–52.0)
Hemoglobin: 14.6 g/dL (ref 13.0–17.0)
Potassium: 5.4 mEq/L — ABNORMAL HIGH (ref 3.5–5.1)
Sodium: 135 mEq/L (ref 135–145)
TCO2: 24 mmol/L (ref 0–100)

## 2013-04-24 LAB — COMPREHENSIVE METABOLIC PANEL
ALT: 16 U/L (ref 0–53)
AST: 13 U/L (ref 0–37)
Albumin: 3.4 g/dL — ABNORMAL LOW (ref 3.5–5.2)
Alkaline Phosphatase: 111 U/L (ref 39–117)
BUN: 65 mg/dL — ABNORMAL HIGH (ref 6–23)
CO2: 24 mEq/L (ref 19–32)
Calcium: 10 mg/dL (ref 8.4–10.5)
Chloride: 94 mEq/L — ABNORMAL LOW (ref 96–112)
Creatinine, Ser: 13.6 mg/dL — ABNORMAL HIGH (ref 0.50–1.35)
GFR calc Af Amer: 4 mL/min — ABNORMAL LOW (ref >90)
GFR calc non Af Amer: 4 mL/min — ABNORMAL LOW (ref >90)
Glucose, Bld: 95 mg/dL (ref 70–99)
Potassium: 5.5 mEq/L — ABNORMAL HIGH (ref 3.5–5.1)
Sodium: 136 mEq/L (ref 135–145)
Total Bilirubin: 0.4 mg/dL (ref 0.3–1.2)
Total Protein: 8 g/dL (ref 6.0–8.3)

## 2013-04-24 LAB — CG4 I-STAT (LACTIC ACID): Lactic Acid, Venous: 1.8 mmol/L (ref 0.5–2.2)

## 2013-04-24 LAB — CBC
HCT: 37.8 % — ABNORMAL LOW (ref 39.0–52.0)
Hemoglobin: 12.8 g/dL — ABNORMAL LOW (ref 13.0–17.0)
MCH: 33.4 pg (ref 26.0–34.0)
MCHC: 33.9 g/dL (ref 30.0–36.0)
MCV: 98.7 fL (ref 78.0–100.0)
Platelets: 157 10*3/uL (ref 150–400)
RBC: 3.83 MIL/uL — ABNORMAL LOW (ref 4.22–5.81)
RDW: 14.3 % (ref 11.5–15.5)
WBC: 4.6 10*3/uL (ref 4.0–10.5)

## 2013-04-24 LAB — POCT I-STAT TROPONIN I: Troponin i, poc: 0.04 ng/mL (ref 0.00–0.08)

## 2013-04-24 MED ORDER — SODIUM POLYSTYRENE SULFONATE 15 GM/60ML PO SUSP
15.0000 g | Freq: Once | ORAL | Status: AC
  Administered 2013-04-24: 15 g via ORAL
  Filled 2013-04-24: qty 60

## 2013-04-24 MED ORDER — SODIUM CHLORIDE 0.9 % IV BOLUS (SEPSIS)
250.0000 mL | Freq: Once | INTRAVENOUS | Status: AC
  Administered 2013-04-24: 250 mL via INTRAVENOUS

## 2013-04-24 MED ORDER — SODIUM CHLORIDE 0.9 % IV BOLUS (SEPSIS): 500.0000 mL | Freq: Once | INTRAVENOUS | Status: DC

## 2013-04-24 MED ORDER — SODIUM CHLORIDE 0.9 % IV SOLN
1.0000 g | INTRAVENOUS | Status: AC
  Administered 2013-04-24: 1 g via INTRAVENOUS
  Filled 2013-04-24: qty 10

## 2013-04-24 MED ORDER — SODIUM CHLORIDE 0.9 % IV BOLUS (SEPSIS)
500.0000 mL | Freq: Once | INTRAVENOUS | Status: AC
  Administered 2013-04-24: 500 mL via INTRAVENOUS

## 2013-04-24 NOTE — ED Notes (Signed)
To ED from PCP via [facility], had dialysis yesterday and sts they took off more fluid than usual, was hypotensive 66/40 at PCP, first EMS 85/50, 122/98 after 250 ml NS, dizzy, no pain, other vitals stable, A/O X4, CBG 120, NAD

## 2013-04-24 NOTE — ED Provider Notes (Signed)
CSN: [id number]     Arrival date & time 04/24/13  1151 History   First MD Initiated Contact with Patient 04/24/13 1252     Chief Complaint  Patient presents with  . Hypotension   (Consider location/radiation/quality/duration/timing/severity/associated sxs/prior Treatment) Patient is a 52 y.o. male presenting with syncope. The history is provided by the patient. No language interpreter was used.  Loss of Consciousness Episode history:  Single Most recent episode:  Today Duration: near syncope. Timing:  Rare Progression:  Resolved Chronicity:  New Context: inactivity   Context comment:  Had 3 extra kg of fluid taken off during HD yesterday Witnessed: yes   Relieved by:  Lying down Ineffective treatments:  None tried Associated symptoms: diaphoresis and visual change   Associated symptoms: no chest pain, no confusion, no dizziness, no fever, no headaches, no nausea, no shortness of breath, no vomiting and no weakness     Past Medical History  Diagnosis Date  . CHF (congestive heart failure)   . History of unilateral nephrectomy   . Hypertension   . Cardiomyopathy   . Gout   . Myocardial infarction ?2006  . Shortness of breath 05/19/11    "at rest, lying down, w/exertion"  . Umbilical hernia [id number]    unrepaired  . Stroke 02/2011    05/19/11 denies residual  . Dialysis patient     Mon-Wed-Fri  . Diabetes mellitus     pt. states he's borderline diabetic., no longer taking med,- off med. since 2013  . Renal cell carcinoma     dialysis- MWF  . ESRD (end stage renal disease)   . Coronary artery disease     normal coronaries by 10/10/08 cath  . History of nephrectomy 07/04/2012    History of right nephrectomy in 2002 for renal cell carcinoma    Past Surgical History  Procedure Laterality Date  . Nephrectomy  2000    right  . Av fistula placement  03/29/2011    Procedure: ARTERIOVENOUS (AV) FISTULA CREATION;  Surgeon: Hinda Lenis, MD;  Location: [employer] [facility];  Service:  Vascular;  Laterality: Left;  LEFT RADIOCEPHALIC , Arteriovenous [id number])  . Smashed  1990's    "left pinky; have a plate in there"  . US echocardiography  05/20/11  . Cardiac catheterization  05/21/11  . Finger surgery      L pinkie finger- ORIF- /w remaining hardware - 1990's    . Umbilical hernia repair N/A 07/04/2012    Procedure: LAPAROSCOPIC UMBILICAL HERNIA;  Surgeon: Madilyn Hook, DO;  Location: [facility];  Service: General;  Laterality: N/A;  . Insertion of mesh N/A 07/04/2012    Procedure: INSERTION OF MESH;  Surgeon: Madilyn Hook, DO;  Location: [facility];  Service: General;  Laterality: N/A;  . Hernia repair N/A [id number]    Umbilical hernia repair   Family History  Problem Relation Age of Onset  . Hypertension Mother   . Kidney disease Mother    History  Substance Use Topics  . Smoking status: Never Smoker   . Smokeless tobacco: Never Used  . Alcohol Use: No    Review of Systems  Constitutional: Positive for diaphoresis. Negative for fever, activity change, appetite change and fatigue.  HENT: Negative for congestion, facial swelling, rhinorrhea and trouble swallowing.   Eyes: Negative for photophobia and pain.  Respiratory: Negative for cough, chest tightness and shortness of breath.   Cardiovascular: Positive for syncope. Negative for chest pain and leg swelling.  Gastrointestinal: Negative for nausea, vomiting, abdominal pain,  diarrhea and constipation.  Endocrine: Negative for polydipsia and polyuria.  Genitourinary: Negative for dysuria, urgency, decreased urine volume and difficulty urinating.  Musculoskeletal: Negative for back pain and gait problem.  Skin: Negative for color change, rash and wound.  Allergic/Immunologic: Negative for immunocompromised state.  Neurological: Positive for light-headedness. Negative for dizziness, facial asymmetry, speech difficulty, weakness, numbness and headaches.  Psychiatric/Behavioral: Negative for confusion, decreased  concentration and agitation.    Allergies  Ace inhibitors  Home Medications   Current Outpatient Rx  Name  Route  Sig  Dispense  Refill  . amLODipine (NORVASC) 10 MG tablet   Oral   Take 10 mg by mouth daily after lunch.          Marland Kitchen aspirin 325 MG tablet   Oral   Take 325 mg by mouth daily.          . calcium acetate (PHOSLO) 667 MG capsule   Oral   Take 667-2,001 mg by mouth 5 (five) times daily. Takes 2 capsules three times a day with meals.  Also takes 1-3 capsule with snacks, dose depends on what the snack is         . carvedilol (COREG) 3.125 MG tablet   Oral   Take 1 tablet (3.125 mg total) by mouth 2 (two) times daily with a meal.   60 tablet   0   . isosorbide dinitrate (ISORDIL) 20 MG tablet   Oral   Take 20 mg by mouth 2 (two) times daily.          . multivitamin (RENA-VIT) TABS tablet   Oral   Take 1 tablet by mouth at bedtime.   30 tablet   0    BP 109/61  Pulse 67  Temp(Src) 97.7 F (36.5 C) (Oral)  Resp 18  SpO2 98% Physical Exam  Constitutional: He is oriented to person, place, and time. He appears well-developed and well-nourished. No distress.  HENT:  Head: Normocephalic and atraumatic.  Mouth/Throat: No oropharyngeal exudate.  Eyes: Pupils are equal, round, and reactive to light.  Neck: Normal range of motion. Neck supple.  Cardiovascular: Normal rate, regular rhythm and normal heart sounds.  Exam reveals no gallop and no friction rub.   No murmur heard. Pulmonary/Chest: Effort normal and breath sounds normal. No respiratory distress. He has no wheezes. He has no rales.  Abdominal: Soft. Bowel sounds are normal. He exhibits no distension and no mass. There is no tenderness. There is no rebound and no guarding.  Musculoskeletal: Normal range of motion. He exhibits no edema and no tenderness.  Neurological: He is alert and oriented to person, place, and time. He has normal strength. He displays no atrophy and no tremor. No cranial nerve  deficit or sensory deficit. He exhibits normal muscle tone. Coordination and gait normal. GCS eye subscore is 4. GCS verbal subscore is 5. GCS motor subscore is 6.  Skin: Skin is warm and dry.  Psychiatric: He has a normal mood and affect.    ED Course  Procedures (including critical care time) Labs Review Labs Reviewed  CBC - Abnormal; Notable for the following:    RBC 3.83 (*)    Hemoglobin 12.8 (*)    HCT 37.8 (*)    All other components within normal limits  PRO B NATRIURETIC PEPTIDE - Abnormal; Notable for the following:    Pro B Natriuretic peptide (BNP) 2306.0 (*)    All other components within normal limits  COMPREHENSIVE METABOLIC PANEL - Abnormal; Notable  for the following:    Potassium 5.5 (*)    Chloride 94 (*)    BUN 65 (*)    Creatinine, Ser 13.60 (*)    Albumin 3.4 (*)    GFR calc non Af Amer 4 (*)    GFR calc Af Amer 4 (*)    All other components within normal limits  POCT I-STAT, CHEM 8 - Abnormal; Notable for the following:    Potassium 5.4 (*)    BUN 60 (*)    Creatinine, Ser 13.80 (*)    All other components within normal limits  CG4 I-STAT (LACTIC ACID)  POCT I-STAT TROPONIN I   Imaging Review Dg Chest Portable 1 View  04/24/2013   CLINICAL DATA:  Shortness of breath.  EXAM: PORTABLE CHEST - 1 VIEW  COMPARISON:  Oct 11, 2012.  FINDINGS: Stable cardiomediastinal silhouette. No pleural effusion or pneumothorax is noted. No acute pulmonary disease is noted. Bony thorax is intact.  IMPRESSION: No acute cardiopulmonary abnormality seen.   Electronically Signed   By: Sabino Dick M.D.   On: 04/24/2013 12:42    EKG Interpretation    Date/Time:  Tuesday April 24 2013 11:58:47 EST Ventricular Rate:  84 PR Interval:  173 QRS Duration: 101 QT Interval:  415 QTC Calculation: 491 R Axis:   15 Text Interpretation:  Sinus rhythm Probable left ventricular hypertrophy Nonspecific T abnormalities, lateral leads Borderline prolonged QT interval New TWI aVL, V5.  Confirmed by DOCHERTY  MD, [place] [id number]) on 04/24/2013 12:03:56 PM            MDM   1. Near syncope   2. Hypotension   3. Hyperkalemia    Pt is a 52 y.o. male with Pmhx as above who presents with hypotension & near syncopal episode from PCP office today. Pt states he had 6kg fluid taken off at HD yesterday (nml is 2.5-3kg) and had to stay after HD due to hypotension. Today had PCP office had near syncopal episode preceded by lightheadedness, diaphoresis.  BP was 60's/40's.  No CP, SOB, palpitations.  No s/sx of infection.  Pt PE, pt well-appearing, in NAD.  BP improved after total of 750cc IVF.  Pt tolerating PO.  Labs w/ K 5.5, but no EKG changes.  Dose of PO kayexalate given.  I believe pt is safe for d/c, will have HD tomorrow.  Return precautions given for new or worsening symptoms including CP, SOB, syncope or further episodes of near syncope.            Neta Ehlers, MD 04/24/13 2048

## 2013-05-16 ENCOUNTER — Encounter: Payer: Self-pay | Admitting: Vascular Surgery

## 2013-05-16 DIAGNOSIS — T829XXA Unspecified complication of cardiac and vascular prosthetic device, implant and graft, initial encounter: Secondary | ICD-10-CM | POA: Insufficient documentation

## 2013-05-18 ENCOUNTER — Ambulatory Visit: Payer: BC Managed Care – PPO | Admitting: Vascular Surgery

## 2013-05-21 ENCOUNTER — Encounter: Payer: Self-pay | Admitting: Vascular Surgery

## 2013-05-22 ENCOUNTER — Other Ambulatory Visit: Payer: Self-pay

## 2013-05-22 ENCOUNTER — Encounter: Payer: Self-pay | Admitting: Vascular Surgery

## 2013-05-22 ENCOUNTER — Ambulatory Visit (INDEPENDENT_AMBULATORY_CARE_PROVIDER_SITE_OTHER): Payer: BC Managed Care – PPO | Admitting: Vascular Surgery

## 2013-05-22 VITALS — BP 105/72 | HR 94 | Resp 18 | Ht 73.0 in | Wt 255.0 lb

## 2013-05-22 DIAGNOSIS — N186 End stage renal disease: Secondary | ICD-10-CM

## 2013-05-22 NOTE — Progress Notes (Signed)
Subjective:     Patient ID: [name], male   DOB: 1961/01/08, 52 y.o.   MRN: [id number]  HPI this 52 year old male was referred for evaluation of his left radial to cephalic A-V fistula which was created by Dr. Lurline Hare in 2012. Patient has an area of eschar that bled following dialysis on one occasion. He has had no other episodes of bleeding or infection. He denies pain in the left hand. He currently has a hemodialysis catheter in the right IJ which is being utilized for his treatments. He dialyzes on Monday Wednesday and Friday  Past Medical History  Diagnosis Date  . CHF (congestive heart failure)   . History of unilateral nephrectomy   . Hypertension   . Cardiomyopathy   . Gout   . Myocardial infarction ?2006  . Shortness of breath 05/19/11    "at rest, lying down, w/exertion"  . Umbilical hernia [id number]    unrepaired  . Stroke 02/2011    05/19/11 denies residual  . Dialysis patient     Mon-Wed-Fri  . Diabetes mellitus     pt. states he's borderline diabetic., no longer taking med,- off med. since 2013  . Renal cell carcinoma     dialysis- MWF  . ESRD (end stage renal disease)   . Coronary artery disease     normal coronaries by 10/10/08 cath  . History of nephrectomy 07/04/2012    History of right nephrectomy in 2002 for renal cell carcinoma     History  Substance Use Topics  . Smoking status: Never Smoker   . Smokeless tobacco: Never Used  . Alcohol Use: No    Family History  Problem Relation Age of Onset  . Hypertension Mother   . Kidney disease Mother     Allergies  Allergen Reactions  . Ace Inhibitors Cough    Current outpatient prescriptions:amLODipine (NORVASC) 10 MG tablet, Take 10 mg by mouth daily after lunch. , Disp: , Rfl: ;  aspirin 325 MG tablet, Take 325 mg by mouth daily. , Disp: , Rfl: ;  calcium acetate (PHOSLO) 667 MG capsule, Take 667-2,001 mg by mouth 5 (five) times daily. Takes 2 capsules three times a day with meals.  Also takes 1-3 capsule  with snacks, dose depends on what the snack is, Disp: , Rfl:  carvedilol (COREG) 3.125 MG tablet, Take 1 tablet (3.125 mg total) by mouth 2 (two) times daily with a meal., Disp: 60 tablet, Rfl: 0;  isosorbide dinitrate (ISORDIL) 20 MG tablet, Take 20 mg by mouth 2 (two) times daily. , Disp: , Rfl: ;  multivitamin (RENA-VIT) TABS tablet, Take 1 tablet by mouth at bedtime., Disp: 30 tablet, Rfl: 0  BP 105/72  Pulse 94  Resp 18  Ht 6\' 1"  (1.854 m)  Wt 255 lb (115.667 kg)  BMI 33.65 kg/m2  Body mass index is 33.65 kg/(m^2).           Review of Systems denies chest pain, dyspnea on exertion, PND, orthopnea, hemoptysis, claudication.    Objective:   Physical Exam BP 105/72  Pulse 94  Resp 18  Ht 6\' 1"  (1.854 m)  Wt 255 lb (115.667 kg)  BMI 33.65 kg/m2  Gen.-alert and oriented x3 in no apparent distress HEENT normal for age Lungs no rhonchi or wheezing Cardiovascular regular rhythm no murmurs carotid pulses 3+ palpable no bruits audible Abdomen soft nontender no palpable masses-obese Musculoskeletal free of  major deformities Skin clear -no rashes Neurologic normal Left upper extremity-left radial cephalic AV  fistula with good pulse and palpable thrill. There 2 areas of significant dilatation the more distal measuring about 2-1/2 cm in diameter with a healed ulcerated area overlying this with an eschar about 1 cm in diameter. There is no infection or active bleeding from this site. Left hand is well perfused      Assessment:     Left radial cephalic AV fistula with healed ulceration with one episode of previous bleeding-patient dialyzes Monday Wednesday Friday I discussed with patient the potential for revising this by excising part of the wall of the vessel and reclose in this and mobilizing skin to cover it-hopefully salvaging the fistula Also explained to him that it may require ligation of the fistula. Other option would be to do nothing but there is the potential for  spontaneous bleeding He would like to proceed with an attempt at revision    Plan:     We'll need to be done on Tuesday or Thursday-have scheduled this for Dr. Vallarie Mare on Tuesday, January 13 4 revision left radial cephalic AV fistula with possible aneurysmorrhaphy versus ligation of left radial cephalic AV fistula

## 2013-05-23 ENCOUNTER — Other Ambulatory Visit: Payer: Self-pay

## 2013-06-04 ENCOUNTER — Encounter (HOSPITAL_COMMUNITY): Payer: Self-pay | Admitting: *Deleted

## 2013-06-04 MED ORDER — DEXTROSE 5 % IV SOLN
1.5000 g | INTRAVENOUS | Status: AC
  Administered 2013-06-05: 1.5 g via INTRAVENOUS
  Filled 2013-06-04: qty 1.5

## 2013-06-04 NOTE — Progress Notes (Signed)
Anesthesia Chart Review: Patient is a 53 year old male scheduled for revision of left arm AVF, possible ligation of left arm AVF on 06/05/13 by Dr.Chen.  He had a area of eschar that bled following dialysis.  He is now using an IJ catheter for HD.  Revision (versus ligation) of his AVF was recommended in hopes to avoid a spontaneous bleed from his AVF ulceration.  He is scheduled to be a same day work-up.  History includes CKD on hemodialysis MWF, DM2, HTN, non-ischemic '10, CHF with last hospitalization for acute exacerbation 07/2012, questionable history of MI '06, CVA '12 without residual but with readmission for transient right sided weakness 09/2012 with unremarkable work-up for source of etiology, renal cell carcinoma s/p right nephrectomy, gout, umbilical hernia repair [id number]. Cardiologist is Dr. Doylene Canard, last visit 05/01/13.    EKG on 04/24/13 showed NSR, probable LVH, lateral T wave abnormality, consider ischemia, borderline prolonged QT interval.  possible LAE, LVH, T wave abnormality, consider ischemia. No significant change since 06/28/11. He has numerous EKGs in Epic. Lateral T wave abnormality has been both more and less pronounced in the past.  Overall, I think his EKG is stable.  30 day event monitor read in 10/2012 showed NSR, PAC's, occasional PVC's.  Echo on 10/06/12 showed: - Left ventricle: The cavity size was severely dilated. There was moderate concentric hypertrophy. Systolic function was severely reduced. The estimated ejection fraction was in the range of 25% to 30%. There is moderate hypokinesis of the entire myocardium. - Mitral valve: Calcified annulus. Mild regurgitation. - Left atrium: The atrium was severely dilated. - Tricuspid valve: Mild regurgitation.  Cardiac cath on 08/03/12 showed: Normal left main coronary artery. Mild disease of left anterior descending artery and its branches. Normal left circumflex artery and its branches. Normal right coronary artery. Dilated LV  with moderate left ventricular systolic dysfunction. LVEDP 19 mmHg. Ejection fraction 30-40%. Medical therapy was recommended.  Nuclear stress test on 08/19/11 showed:  1. Dilated left ventricle with global hypokinesis and estimated ejection fraction of 26%.  2. Anteroseptal and inferior wall scar/infarction.  3. No findings for ischemia.  Echo on 11/16/11 (Dr. Merrilee Jansky office, see 11/16/11 office note) showed: dilated LV size and moderate LVH and systolic dysfunction. Mild diastolic dysfunction. Moderate generalized hypokinesis, EF 30%. Normal RV size and systolic function. Markedly dilated LA. Mild MR/TR. Aortic root dilatation (aorta 4.22 cm).   He had a right heart cath on 05/21/11 that showed:  1. Mild to moderate elevated right heart and wedge pressures.  2. Dilated cardiomyopathy with preserved cardiac output.  Dr. Doylene Canard recommended resuming Lasix, renal consult, and avoid inotropic medications for now. His Coreg was ultimately reduced by 50%.   Carotid duplex on 10/06/12 showed: No significant extracranial carotid artery stenosis demonstrated. Vertebrals are patent with antegrade flow.  2V CXR on 10/05/12 showed: Stable cardiomegaly and pulmonary venous hypertension. No active  disease. 1V CXR on 04/24/13 showed: No acute cardiopulmonary abnormality seen.    He is scheduled for labs on the day of surgery.  Chart reviewed with anesthesiologist Dr. Conrad .  Patient with herald bleed from his AVF and needs revision or ligation to prevent spontaneous bleed.  He had cardiology follow-up approximately 1 month ago.  He will be evaluated by his assigned anesthesiologist on the day of surgery.  If no new CV/CHF symptoms and labs are acceptable then it is anticipated that he can proceed as planned with this procedure.  Myra Gianotti, PA-C [facility] Short Stay Center/Anesthesiology Phone (  [phone] 06/04/2013 12:53 PM

## 2013-06-04 NOTE — Progress Notes (Addendum)
Patient notified to arrive at 05:30 d/t time change

## 2013-06-04 NOTE — Progress Notes (Signed)
Pt med rec not completed. According to pt, he takes Allopurinol, Amlodipine and Isordil in the morning; no longer taking the Coreg.

## 2013-06-05 ENCOUNTER — Encounter (HOSPITAL_COMMUNITY): Payer: BC Managed Care – PPO | Admitting: Vascular Surgery

## 2013-06-05 ENCOUNTER — Encounter (HOSPITAL_COMMUNITY): Payer: Self-pay | Admitting: *Deleted

## 2013-06-05 ENCOUNTER — Ambulatory Visit (HOSPITAL_COMMUNITY)
Admission: RE | Admit: 2013-06-05 | Discharge: 2013-06-05 | Disposition: A | Discharge status: Home / Self Care | Payer: BC Managed Care – PPO | Source: Ambulatory Visit | Attending: Vascular Surgery | Admitting: Vascular Surgery

## 2013-06-05 ENCOUNTER — Encounter (HOSPITAL_COMMUNITY)
Admission: RE | Disposition: A | Discharge status: Home / Self Care | Payer: Self-pay | Source: Ambulatory Visit | Attending: Vascular Surgery

## 2013-06-05 ENCOUNTER — Ambulatory Visit (HOSPITAL_COMMUNITY): Payer: BC Managed Care – PPO | Admitting: Vascular Surgery

## 2013-06-05 DIAGNOSIS — Z85528 Personal history of other malignant neoplasm of kidney: Secondary | ICD-10-CM | POA: Insufficient documentation

## 2013-06-05 DIAGNOSIS — R7309 Other abnormal glucose: Secondary | ICD-10-CM | POA: Insufficient documentation

## 2013-06-05 DIAGNOSIS — I770 Arteriovenous fistula, acquired: Secondary | ICD-10-CM | POA: Insufficient documentation

## 2013-06-05 DIAGNOSIS — I252 Old myocardial infarction: Secondary | ICD-10-CM | POA: Insufficient documentation

## 2013-06-05 DIAGNOSIS — Z905 Acquired absence of kidney: Secondary | ICD-10-CM | POA: Insufficient documentation

## 2013-06-05 DIAGNOSIS — N186 End stage renal disease: Secondary | ICD-10-CM

## 2013-06-05 DIAGNOSIS — Y849 Medical procedure, unspecified as the cause of abnormal reaction of the patient, or of later complication, without mention of misadventure at the time of the procedure: Secondary | ICD-10-CM | POA: Insufficient documentation

## 2013-06-05 DIAGNOSIS — I120 Hypertensive chronic kidney disease with stage 5 chronic kidney disease or end stage renal disease: Secondary | ICD-10-CM | POA: Insufficient documentation

## 2013-06-05 DIAGNOSIS — T82898A Other specified complication of vascular prosthetic devices, implants and grafts, initial encounter: Secondary | ICD-10-CM

## 2013-06-05 DIAGNOSIS — Z992 Dependence on renal dialysis: Secondary | ICD-10-CM | POA: Insufficient documentation

## 2013-06-05 HISTORY — DX: Blindness, one eye, unspecified eye: H54.40

## 2013-06-05 HISTORY — PX: REVISON OF ARTERIOVENOUS FISTULA: SHX6074

## 2013-06-05 LAB — GLUCOSE, CAPILLARY
Glucose-Capillary: 86 mg/dL (ref 70–99)
Glucose-Capillary: 104 mg/dL — ABNORMAL HIGH (ref 70–99)

## 2013-06-05 LAB — POCT I-STAT 4, (NA,K, GLUC, HGB,HCT)
Glucose, Bld: 90 mg/dL (ref 70–99)
HCT: 40 % (ref 39.0–52.0)
Hemoglobin: 13.6 g/dL (ref 13.0–17.0)
Potassium: 5.3 mEq/L (ref 3.7–5.3)
Sodium: 138 mEq/L (ref 137–147)

## 2013-06-05 SURGERY — REVISON OF ARTERIOVENOUS FISTULA
Anesthesia: General | Site: Arm Lower | Laterality: Left

## 2013-06-05 MED ORDER — HEPARIN SODIUM (PORCINE) 1000 UNIT/ML IJ SOLN
INTRAMUSCULAR | Status: DC | PRN
  Administered 2013-06-05: 3000 [IU] via INTRAVENOUS

## 2013-06-05 MED ORDER — THROMBIN 20000 UNITS EX SOLR
CUTANEOUS | Status: DC | PRN
  Administered 2013-06-05: 08:00:00 via TOPICAL

## 2013-06-05 MED ORDER — PHENYLEPHRINE HCL 10 MG/ML IJ SOLN
10.0000 mg | INTRAVENOUS | Status: DC | PRN
  Administered 2013-06-05: 50 ug/min via INTRAVENOUS

## 2013-06-05 MED ORDER — PROPOFOL 10 MG/ML IV BOLUS
INTRAVENOUS | Status: DC | PRN
  Administered 2013-06-05: 200 mg via INTRAVENOUS

## 2013-06-05 MED ORDER — PHENYLEPHRINE HCL 10 MG/ML IJ SOLN
INTRAMUSCULAR | Status: DC | PRN
  Administered 2013-06-05 (×3): 80 ug via INTRAVENOUS
  Administered 2013-06-05: 120 ug via INTRAVENOUS

## 2013-06-05 MED ORDER — BUPIVACAINE HCL (PF) 0.5 % IJ SOLN
INTRAMUSCULAR | Status: AC
  Filled 2013-06-05: qty 10

## 2013-06-05 MED ORDER — MIDAZOLAM HCL 5 MG/5ML IJ SOLN
INTRAMUSCULAR | Status: DC | PRN
Start: 2013-06-05 — End: 2013-06-05
  Administered 2013-06-05 (×2): 1 mg via INTRAVENOUS

## 2013-06-05 MED ORDER — THROMBIN 20000 UNITS EX SOLR
CUTANEOUS | Status: AC
  Filled 2013-06-05: qty 20000

## 2013-06-05 MED ORDER — SODIUM CHLORIDE 0.9 % IV SOLN: INTRAVENOUS | Status: DC

## 2013-06-05 MED ORDER — FENTANYL CITRATE 0.05 MG/ML IJ SOLN
INTRAMUSCULAR | Status: DC | PRN
  Administered 2013-06-05 (×2): 50 ug via INTRAVENOUS

## 2013-06-05 MED ORDER — CARVEDILOL 12.5 MG PO TABS
ORAL_TABLET | ORAL | Status: AC
  Administered 2013-06-05: 07:00:00
  Filled 2013-06-05: qty 1

## 2013-06-05 MED ORDER — LIDOCAINE HCL (CARDIAC) 20 MG/ML IV SOLN
INTRAVENOUS | Status: DC | PRN
  Administered 2013-06-05: 80 mg via INTRAVENOUS

## 2013-06-05 MED ORDER — BUPIVACAINE HCL (PF) 0.5 % IJ SOLN
INTRAMUSCULAR | Status: DC | PRN
  Administered 2013-06-05: 30 mL

## 2013-06-05 MED ORDER — LIDOCAINE-EPINEPHRINE (PF) 1 %-1:200000 IJ SOLN
INTRAMUSCULAR | Status: DC | PRN
  Administered 2013-06-05: 30 mL

## 2013-06-05 MED ORDER — EPHEDRINE SULFATE 50 MG/ML IJ SOLN
INTRAMUSCULAR | Status: DC | PRN
  Administered 2013-06-05: 10 mg via INTRAVENOUS
  Administered 2013-06-05: 5 mg via INTRAVENOUS
  Administered 2013-06-05: 10 mg via INTRAVENOUS

## 2013-06-05 MED ORDER — HEPARIN SODIUM (PORCINE) 5000 UNIT/ML IJ SOLN
INTRAMUSCULAR | Status: DC | PRN
  Administered 2013-06-05: 07:00:00

## 2013-06-05 MED ORDER — SODIUM CHLORIDE 0.9 % IV SOLN
INTRAVENOUS | Status: DC | PRN
  Administered 2013-06-05: 07:00:00 via INTRAVENOUS

## 2013-06-05 MED ORDER — 0.9 % SODIUM CHLORIDE (POUR BTL) OPTIME
TOPICAL | Status: DC | PRN
  Administered 2013-06-05: 1000 mL

## 2013-06-05 MED ORDER — ONDANSETRON HCL 4 MG/2ML IJ SOLN
INTRAMUSCULAR | Status: DC | PRN
  Administered 2013-06-05: 4 mg via INTRAVENOUS

## 2013-06-05 MED ORDER — HYDROCODONE-ACETAMINOPHEN 5-325 MG PO TABS: 1.0000 | ORAL_TABLET | Freq: Four times a day (QID) | ORAL | Status: DC | PRN

## 2013-06-05 SURGICAL SUPPLY — 49 items
ADH SKN CLS APL DERMABOND .7 (GAUZE/BANDAGES/DRESSINGS) ×1
BANDAGE ESMARK 6X9 LF (GAUZE/BANDAGES/DRESSINGS) IMPLANT
BNDG CMPR 9X6 STRL LF SNTH (GAUZE/BANDAGES/DRESSINGS) ×1
BNDG ESMARK 6X9 LF (GAUZE/BANDAGES/DRESSINGS) ×2
CANISTER SUCTION 2500CC (MISCELLANEOUS) ×2 IMPLANT
CATH EMB 3FR 80CM (CATHETERS) ×1 IMPLANT
CLIP TI MEDIUM 6 (CLIP) ×2 IMPLANT
CLIP TI WIDE RED SMALL 6 (CLIP) ×2 IMPLANT
COVER PROBE W GEL 5X96 (DRAPES) IMPLANT
COVER SURGICAL LIGHT HANDLE (MISCELLANEOUS) ×2 IMPLANT
CUFF TOURNIQUET SINGLE 34IN LL (TOURNIQUET CUFF) ×1 IMPLANT
DECANTER SPIKE VIAL GLASS SM (MISCELLANEOUS) ×2 IMPLANT
DERMABOND ADVANCED (GAUZE/BANDAGES/DRESSINGS) ×1
DERMABOND ADVANCED .7 DNX12 (GAUZE/BANDAGES/DRESSINGS) ×1 IMPLANT
DRAIN PENROSE 1/2X12 LTX STRL (WOUND CARE) IMPLANT
ELECT REM PT RETURN 9FT ADLT (ELECTROSURGICAL) ×2
ELECTRODE REM PT RTRN 9FT ADLT (ELECTROSURGICAL) ×1 IMPLANT
GLOVE BIO SURGEON STRL SZ 6.5 (GLOVE) ×1 IMPLANT
GLOVE BIO SURGEON STRL SZ7 (GLOVE) ×2 IMPLANT
GLOVE BIOGEL PI IND STRL 6.5 (GLOVE) IMPLANT
GLOVE BIOGEL PI IND STRL 7.0 (GLOVE) IMPLANT
GLOVE BIOGEL PI IND STRL 7.5 (GLOVE) ×1 IMPLANT
GLOVE BIOGEL PI INDICATOR 6.5 (GLOVE) ×1
GLOVE BIOGEL PI INDICATOR 7.0 (GLOVE) ×2
GLOVE BIOGEL PI INDICATOR 7.5 (GLOVE) ×2
GLOVE SS BIOGEL STRL SZ 7 (GLOVE) IMPLANT
GLOVE SUPERSENSE BIOGEL SZ 7 (GLOVE) ×1
GOWN STRL REUS W/ TWL LRG LVL3 (GOWN DISPOSABLE) ×3 IMPLANT
GOWN STRL REUS W/TWL LRG LVL3 (GOWN DISPOSABLE) ×6
KIT BASIN OR (CUSTOM PROCEDURE TRAY) ×2 IMPLANT
KIT ROOM TURNOVER OR (KITS) ×2 IMPLANT
NS IRRIG 1000ML POUR BTL (IV SOLUTION) ×2 IMPLANT
PACK CV ACCESS (CUSTOM PROCEDURE TRAY) ×2 IMPLANT
PAD ARMBOARD 7.5X6 YLW CONV (MISCELLANEOUS) ×4 IMPLANT
SPONGE GAUZE 4X4 12PLY STER LF (GAUZE/BANDAGES/DRESSINGS) ×1 IMPLANT
SPONGE SURGIFOAM ABS GEL 100 (HEMOSTASIS) ×1 IMPLANT
STAPLER VISISTAT 35W (STAPLE) ×1 IMPLANT
SUT MNCRL AB 4-0 PS2 18 (SUTURE) ×2 IMPLANT
SUT PROLENE 5 0 C 1 24 (SUTURE) ×3 IMPLANT
SUT PROLENE 6 0 BV (SUTURE) ×1 IMPLANT
SUT PROLENE 7 0 BV 1 (SUTURE) ×1 IMPLANT
SUT VIC AB 3-0 SH 27 (SUTURE) ×2
SUT VIC AB 3-0 SH 27X BRD (SUTURE) ×1 IMPLANT
SYR TB 1ML LUER SLIP (SYRINGE) ×1 IMPLANT
TAPE CLOTH SURG 4X10 WHT LF (GAUZE/BANDAGES/DRESSINGS) ×1 IMPLANT
TOWEL OR 17X24 6PK STRL BLUE (TOWEL DISPOSABLE) ×2 IMPLANT
TOWEL OR 17X26 10 PK STRL BLUE (TOWEL DISPOSABLE) ×2 IMPLANT
UNDERPAD 30X30 INCONTINENT (UNDERPADS AND DIAPERS) ×2 IMPLANT
WATER STERILE IRR 1000ML POUR (IV SOLUTION) ×2 IMPLANT

## 2013-06-05 NOTE — Transfer of Care (Signed)
Immediate Anesthesia Transfer of Care Note  Patient: [name]  Procedure(s) Performed: Procedure(s): REVISON OF LEFT RADIOCEPHALIC  ARTERIOVENOUS FISTULA (Left)  Patient Location: PACU  Anesthesia Type:General  Level of Consciousness: awake, sedated, patient cooperative and responds to stimulation  Airway & Oxygen Therapy: Patient Spontanous Breathing and Patient connected to nasal cannula oxygen  Post-op Assessment: Report given to PACU RN, Post -op Vital signs reviewed and stable and Patient moving all extremities  Post vital signs: Reviewed and stable  Complications: No apparent anesthesia complications

## 2013-06-05 NOTE — Op Note (Signed)
OPERATIVE NOTE   PROCEDURE: 1. Plication of left radiocephalic arteriovenous fistula   PRE-OPERATIVE DIAGNOSIS: Pseudoaneurysm degeneration of arteriovenous fistula   POST-OPERATIVE DIAGNOSIS: same as above   SURGEON: Adele Barthel, MD  ASSISTANT(S): Gerri Lins, PAC   ANESTHESIA: general  ESTIMATED BLOOD LOSS: 50 cc  FINDING(S): 1. Cephalic vein pseudoaneurysm x 2 2. Palpable thrill at end of the case 3. Relative stenosis at most distal pseudoaneurysm  SPECIMEN(S):  none  INDICATIONS:   [name] is a 53 y.o. male who  presents with pseudoaneurysmal degeneration of left arm arteriovenous fistula with bleeding complications.  In order to salvage the fistula and decrease the bleeding complication risks, Dr. Kellie Simmering recommended plication of the fistula.  Risk, benefits, and alternatives to access surgery were discussed.  The patient is aware the risks include but are not limited to: bleeding, infection, steal syndrome, nerve damage, ischemic monomelic neuropathy, thrombosis, need for additional procedures, death and stroke.  The patient agrees to proceed forward with the procedure.  DESCRIPTION: After obtaining full informed written consent, the patient was brought back to the operating room and placed supine upon the operating table.  The patient received IV antibiotics prior to induction.  After obtaining adequate anesthesia, the patient was prepped and draped in the standard fashion for: left arm access procedure.  The patient was given 3000 units of Heparin intravenously, which was a therapeutic bolus.  After waiting 5 minutes, a sterile tourniquet was applied to the upper arm and inflated to 250 mm Hg after exsanguinating the arm with an Esmark bandage.  An elliptical incision was made around the skin overlying the distal pseudoaneurysmal segment in the fistula.  I dissected the skin away from the pseudoaneurysm with electrocautery.  Eventually, I was able to skeletonize the  pseudoaneurysm.  I sharply entered into the pseudoaneurysm to verify the position of the neck of the pseudoaneurysm.  I sharply removed the pseudoaneurysm at its neck.  The residual fistula wall was felt to be adequately strong to repair.  I repaired the fistula with a running stitch of 5-0 Prolene, taking care to take large bites of the fistula wall.  There was a relative stenosis in this segment relative to the rest of the fistula.  Prior to completing this repair, I pass 4 mm dilator proximally and distally: some resistance was encountered due to tortuosity but the dilator passed.  The repair was completed in the usual fashion.  In a similar fashion, I made an incision over the proximal pseudoaneurysm.  The pseudoaneurysm was dissected out and cut out.  The fistula was repaired with a running stitch of 5-0 Prolene.  Prior to completing this repair, I allowed the fistula to bleed.  The bleeding was limited non-pulsatile, so I elected to pass a 3 Fogarty balloon distally.  The balloon passed without any difficulty.  I inflated the balloon and pulled back the balloon with limited expected resistance.  There were no clots obtained.  The repair was completed in the usual fashion.  Thrombin and gelfoam were applied to the incision.  Bleeding points were controlled with suture ligature and electrocautery.  After a few more round of thrombin and gelfoam, no further active bleeding was noted.  I reapproximated the subcutaneous tissue in both incisions immediately above the fistula with a running stitch of 3-0 Vicryl.  The skin was clean, dried, and staples applied to reapproximate the skin in both incisions.  The patient continued to have a palpable thrill in the fistula.  COMPLICATIONS: none  CONDITION: stable  Adele Barthel, MD [facility] Office: [phone] Pager: [phone]  06/05/2013, 8:48 AM

## 2013-06-05 NOTE — Anesthesia Postprocedure Evaluation (Signed)
  Anesthesia Post-op Note  Patient: [name]  Procedure(s) Performed: Procedure(s): REVISON OF LEFT RADIOCEPHALIC  ARTERIOVENOUS FISTULA (Left)  Patient Location: PACU  Anesthesia Type:General  Level of Consciousness: awake and alert   Airway and Oxygen Therapy: Patient Spontanous Breathing  Post-op Pain: mild  Post-op Assessment: Post-op Vital signs reviewed  Post-op Vital Signs: stable  Complications: No apparent anesthesia complications

## 2013-06-05 NOTE — H&P (View-Only) (Signed)
Subjective:     Patient ID: [name], male   DOB: [birth year], 53 y.o.   MRN: [id number]  HPI this 53 year old male was referred for evaluation of his left radial to cephalic A-V fistula which was created by Dr. Lurline Hare in 2012. Patient has an area of eschar that bled following dialysis on one occasion. He has had no other episodes of bleeding or infection. He denies pain in the left hand. He currently has a hemodialysis catheter in the right IJ which is being utilized for his treatments. He dialyzes on Monday Wednesday and Friday  Past Medical History  Diagnosis Date  . CHF (congestive heart failure)   . History of unilateral nephrectomy   . Hypertension   . Cardiomyopathy   . Gout   . Myocardial infarction ?2006  . Shortness of breath 05/19/11    "at rest, lying down, w/exertion"  . Umbilical hernia [id number]    unrepaired  . Stroke 02/2011    05/19/11 denies residual  . Dialysis patient     Mon-Wed-Fri  . Diabetes mellitus     pt. states he's borderline diabetic., no longer taking med,- off med. since 2013  . Renal cell carcinoma     dialysis- MWF  . ESRD (end stage renal disease)   . Coronary artery disease     normal coronaries by 10/10/08 cath  . History of nephrectomy 07/04/2012    History of right nephrectomy in 2002 for renal cell carcinoma     History  Substance Use Topics  . Smoking status: Never Smoker   . Smokeless tobacco: Never Used  . Alcohol Use: No    Family History  Problem Relation Age of Onset  . Hypertension Mother   . Kidney disease Mother     Allergies  Allergen Reactions  . Ace Inhibitors Cough    Current outpatient prescriptions:amLODipine (NORVASC) 10 MG tablet, Take 10 mg by mouth daily after lunch. , Disp: , Rfl: ;  aspirin 325 MG tablet, Take 325 mg by mouth daily. , Disp: , Rfl: ;  calcium acetate (PHOSLO) 667 MG capsule, Take 667-2,001 mg by mouth 5 (five) times daily. Takes 2 capsules three times a day with meals.  Also takes 1-3 capsule  with snacks, dose depends on what the snack is, Disp: , Rfl:  carvedilol (COREG) 3.125 MG tablet, Take 1 tablet (3.125 mg total) by mouth 2 (two) times daily with a meal., Disp: 60 tablet, Rfl: 0;  isosorbide dinitrate (ISORDIL) 20 MG tablet, Take 20 mg by mouth 2 (two) times daily. , Disp: , Rfl: ;  multivitamin (RENA-VIT) TABS tablet, Take 1 tablet by mouth at bedtime., Disp: 30 tablet, Rfl: 0  BP 105/72  Pulse 94  Resp 18  Ht 6\' 1"  (1.854 m)  Wt 255 lb (115.667 kg)  BMI 33.65 kg/m2  Body mass index is 33.65 kg/(m^2).           Review of Systems denies chest pain, dyspnea on exertion, PND, orthopnea, hemoptysis, claudication.    Objective:   Physical Exam BP 105/72  Pulse 94  Resp 18  Ht 6\' 1"  (1.854 m)  Wt 255 lb (115.667 kg)  BMI 33.65 kg/m2  Gen.-alert and oriented x3 in no apparent distress HEENT normal for age Lungs no rhonchi or wheezing Cardiovascular regular rhythm no murmurs carotid pulses 3+ palpable no bruits audible Abdomen soft nontender no palpable masses-obese Musculoskeletal free of  major deformities Skin clear -no rashes Neurologic normal Left upper extremity-left radial cephalic AV  fistula with good pulse and palpable thrill. There 2 areas of significant dilatation the more distal measuring about 2-1/2 cm in diameter with a healed ulcerated area overlying this with an eschar about 1 cm in diameter. There is no infection or active bleeding from this site. Left hand is well perfused      Assessment:     Left radial cephalic AV fistula with healed ulceration with one episode of previous bleeding-patient dialyzes Monday Wednesday Friday I discussed with patient the potential for revising this by excising part of the wall of the vessel and reclose in this and mobilizing skin to cover it-hopefully salvaging the fistula Also explained to him that it may require ligation of the fistula. Other option would be to do nothing but there is the potential for  spontaneous bleeding He would like to proceed with an attempt at revision    Plan:     We'll need to be done on Tuesday or Thursday-have scheduled this for Dr. Vallarie Mare on Tuesday, January 13 4 revision left radial cephalic AV fistula with possible aneurysmorrhaphy versus ligation of left radial cephalic AV fistula

## 2013-06-05 NOTE — Discharge Instructions (Signed)

## 2013-06-05 NOTE — Interval H&P Note (Signed)
History and Physical Interval Note:  06/05/2013 7:26 AM  [name]  has presented today for surgery, with the diagnosis of End Stage Renal Disease  The various methods of treatment have been discussed with the patient and family. After consideration of risks, benefits and other options for treatment, the patient has consented to  Procedure(s): REVISON OF LEFT RADIOCEPHALIC  ARTERIOVENOUS FISTULA; POSSIBLE LIGATION OF AVF (Left) as a surgical intervention .  The patient's history has been reviewed, patient examined, no change in status, stable for surgery.  I have reviewed the patient's chart and labs.  Questions were answered to the patient's satisfaction.     CHEN,BRIAN LIANG-YU

## 2013-06-05 NOTE — Anesthesia Preprocedure Evaluation (Signed)
Anesthesia Evaluation  Patient identified by MRN, date of birth, ID band Patient awake    Reviewed: Allergy & Precautions, H&P , NPO status , Patient's Chart, lab work & pertinent test results, reviewed documented beta blocker date and time   History of Anesthesia Complications Negative for: history of anesthetic complications  Airway Mallampati: II TM Distance: >3 FB Neck ROM: Full    Dental no notable dental hx. (+) Teeth Intact and Dental Advisory Given   Pulmonary shortness of breath, with exertion, at rest and lying,  breath sounds clear to auscultation  Pulmonary exam normal       Cardiovascular hypertension, Pt. on medications and Pt. on home beta blockers + CAD, + Past MI and +CHF Rhythm:regular Rate:Normal  Study Conclusions  - Left ventricle: The cavity size was severely dilated.   There was moderate concentric hypertrophy. Systolic   function was severely reduced. The estimated ejection   fraction was in the range of 25% to 30%. Diffuse   hypokinesis. - Mitral valve: Moderate regurgitation. - Left atrium: The atrium was severely dilated. - Right ventricle: The cavity size was normal. Wall   thickness was mildly increased.    Neuro/Psych CVA, No Residual Symptoms    GI/Hepatic negative GI ROS, Umbilical hernia   Endo/Other  diabetes, Type 2Morbid obesity  Renal/GU      Musculoskeletal   Abdominal (+) + obese,   Peds  Hematology   Anesthesia Other Findings  CHF (congestive heart failure)     History of unilateral nephrectomy        Hypertension     Cardiomyopathy        Gout     Myocardial infarction ?2006      Shortness of breath 05/19/11 "at rest, lying down, w/exertion" Umbilical hernia [id number] unrepaired    Stroke 02/2011 05/19/11 denies residual Dialysis patient   Mon-Wed-Fri    Diabetes mellitus   pt. states he's borderline diabetic., no longer taking med,- off med. since 2013 Renal cell  carcinoma   dialysis- MWF    ESRD (end stage renal disease)     Coronary artery disease   normal coronaries by 10/10/08 cath    Reproductive/Obstetrics                           Anesthesia Physical Anesthesia Plan  ASA: III  Anesthesia Plan: MAC   Post-op Pain Management:    Induction: Intravenous  Airway Management Planned: Simple Face Mask  Additional Equipment:   Intra-op Plan:   Post-operative Plan:   Informed Consent: I have reviewed the patients History and Physical, chart, labs and discussed the procedure including the risks, benefits and alternatives for the proposed anesthesia with the patient or authorized representative who has indicated his/her understanding and acceptance.   Dental advisory given  Plan Discussed with: CRNA and Surgeon  Anesthesia Plan Comments:         Anesthesia Quick Evaluation

## 2013-06-06 ENCOUNTER — Telehealth: Payer: Self-pay | Admitting: Vascular Surgery

## 2013-06-06 NOTE — Telephone Encounter (Addendum)
Message copied by Gena Fray on Wed Jun 06, 2013 11:44 AM ------      Message from: Denman George      Created: Tue Jun 05, 2013 11:09 AM                   ----- Message -----         From: Conrad Victor, MD         Sent: 06/05/2013   8:58 AM           To: Patrici Ranks, Sherrye Payor, RN            Horacio Paller      [id number]      [birth year]            PROCEDURE:      Plication of left radiocephalic arteriovenous fistula             Asst: Gerri Lins, [facility]             Follow-up: 2 weeks       ------  06/05/13: Horris Latino contacted pt with appt information, dpm

## 2013-06-06 NOTE — Telephone Encounter (Signed)
[facility] - 2 wl fu s/p plication of left radiocephalic arteriovenous fistula as per staff message 06-05-13   Bar   Unable to reach patient by phone, mailed appt. Letter.

## 2013-06-07 ENCOUNTER — Encounter (HOSPITAL_COMMUNITY): Payer: Self-pay | Admitting: Vascular Surgery

## 2013-06-20 DIAGNOSIS — E1129 Type 2 diabetes mellitus with other diabetic kidney complication: Secondary | ICD-10-CM | POA: Diagnosis not present

## 2013-06-20 DIAGNOSIS — N186 End stage renal disease: Secondary | ICD-10-CM | POA: Diagnosis not present

## 2013-06-21 ENCOUNTER — Encounter: Payer: Self-pay | Admitting: Vascular Surgery

## 2013-06-22 ENCOUNTER — Ambulatory Visit: Payer: BC Managed Care – PPO | Admitting: Vascular Surgery

## 2013-06-22 ENCOUNTER — Encounter: Payer: Self-pay | Admitting: Vascular Surgery

## 2013-06-22 ENCOUNTER — Ambulatory Visit (INDEPENDENT_AMBULATORY_CARE_PROVIDER_SITE_OTHER): Payer: Medicare Other | Admitting: Vascular Surgery

## 2013-06-22 VITALS — BP 140/102 | HR 98 | Temp 98.6°F | Resp 16 | Ht 73.0 in | Wt 260.0 lb

## 2013-06-22 DIAGNOSIS — N186 End stage renal disease: Secondary | ICD-10-CM

## 2013-06-22 DIAGNOSIS — Z48812 Encounter for surgical aftercare following surgery on the circulatory system: Secondary | ICD-10-CM | POA: Insufficient documentation

## 2013-06-22 NOTE — Progress Notes (Signed)
    Postoperative Access Visit   History of Present Illness  [name] is a 53 y.o. year old male who presents for postoperative follow-up for: plication of left RC AVF (Date: 06/05/13).  The patient's wounds are healed.  The patient notes no steal symptoms.  The patient is able to complete their activities of daily living.  The patient's current symptoms are: none.  For VQI Use Only  PRE-ADM LIVING: Home  AMB STATUS: Ambulatory  Physical Examination Filed Vitals:   06/22/13 1558  BP: 140/102  Pulse: 98  Temp: 98.6 F (37 C)  Resp: 16    LUE: Incision is healed, staples in place, skin feels warm, hand grip is 5/5, sensation in digits is intact, palpable thrill, bruit can be auscultated   Medical Decision Making  [name] is a 53 y.o. year old male who presents s/p L RC AVF plication.  The L RC AVF can be used for HD at this point.  Cannulation lateral to the incision might be more easily accomplished with the scar tissue present.  The patient's tunneled dialysis catheter can be removed after two successful cannulations and completed dialysis treatments.  Thank you for allowing [place] to participate in this patient's care.  Adele Barthel, MD [facility] Office: [phone] Pager: [phone]  06/22/2013, 4:18 PM

## 2013-06-23 DIAGNOSIS — N186 End stage renal disease: Secondary | ICD-10-CM | POA: Diagnosis not present

## 2013-06-25 DIAGNOSIS — E213 Hyperparathyroidism, unspecified: Secondary | ICD-10-CM | POA: Insufficient documentation

## 2013-09-12 DIAGNOSIS — E1129 Type 2 diabetes mellitus with other diabetic kidney complication: Secondary | ICD-10-CM | POA: Diagnosis not present

## 2013-11-15 ENCOUNTER — Other Ambulatory Visit (HOSPITAL_COMMUNITY): Payer: Self-pay | Admitting: Cardiovascular Disease

## 2013-11-15 DIAGNOSIS — I25118 Atherosclerotic heart disease of native coronary artery with other forms of angina pectoris: Secondary | ICD-10-CM

## 2013-12-11 ENCOUNTER — Encounter (HOSPITAL_COMMUNITY)
Admission: RE | Admit: 2013-12-11 | Discharge: 2013-12-11 | Disposition: A | Discharge status: Home / Self Care | Payer: BC Managed Care – PPO | Source: Ambulatory Visit | Attending: Cardiovascular Disease | Admitting: Cardiovascular Disease

## 2013-12-11 ENCOUNTER — Other Ambulatory Visit: Payer: Self-pay

## 2013-12-11 DIAGNOSIS — I209 Angina pectoris, unspecified: Secondary | ICD-10-CM | POA: Diagnosis present

## 2013-12-11 DIAGNOSIS — I2510 Atherosclerotic heart disease of native coronary artery without angina pectoris: Secondary | ICD-10-CM | POA: Insufficient documentation

## 2013-12-11 DIAGNOSIS — I25118 Atherosclerotic heart disease of native coronary artery with other forms of angina pectoris: Secondary | ICD-10-CM

## 2013-12-11 MED ORDER — REGADENOSON 0.4 MG/5ML IV SOLN
INTRAVENOUS | Status: AC
  Filled 2013-12-11: qty 5

## 2013-12-11 MED ORDER — REGADENOSON 0.4 MG/5ML IV SOLN
0.4000 mg | Freq: Once | INTRAVENOUS | Status: AC
  Administered 2013-12-11: 0.4 mg via INTRAVENOUS

## 2013-12-11 MED ORDER — TECHNETIUM TC 99M SESTAMIBI GENERIC - CARDIOLITE
10.0000 | Freq: Once | INTRAVENOUS | Status: AC | PRN
  Administered 2013-12-11: 10 via INTRAVENOUS

## 2013-12-11 MED ORDER — TECHNETIUM TC 99M SESTAMIBI GENERIC - CARDIOLITE
30.0000 | Freq: Once | INTRAVENOUS | Status: AC | PRN
  Administered 2013-12-11: 30 via INTRAVENOUS

## 2013-12-12 DIAGNOSIS — E1129 Type 2 diabetes mellitus with other diabetic kidney complication: Secondary | ICD-10-CM | POA: Diagnosis not present

## 2013-12-18 ENCOUNTER — Other Ambulatory Visit: Payer: Self-pay | Admitting: Cardiovascular Disease

## 2013-12-18 ENCOUNTER — Ambulatory Visit
Admission: RE | Admit: 2013-12-18 | Discharge: 2013-12-18 | Disposition: A | Discharge status: Home / Self Care | Payer: BC Managed Care – PPO | Source: Ambulatory Visit | Attending: Cardiovascular Disease | Admitting: Cardiovascular Disease

## 2013-12-18 DIAGNOSIS — R05 Cough: Secondary | ICD-10-CM

## 2013-12-18 DIAGNOSIS — R059 Cough, unspecified: Secondary | ICD-10-CM

## 2014-02-22 DIAGNOSIS — N186 End stage renal disease: Secondary | ICD-10-CM | POA: Diagnosis not present

## 2014-02-25 DIAGNOSIS — N186 End stage renal disease: Secondary | ICD-10-CM | POA: Diagnosis not present

## 2014-02-27 DIAGNOSIS — N186 End stage renal disease: Secondary | ICD-10-CM | POA: Diagnosis not present

## 2014-03-01 DIAGNOSIS — N186 End stage renal disease: Secondary | ICD-10-CM | POA: Diagnosis not present

## 2014-03-04 DIAGNOSIS — N186 End stage renal disease: Secondary | ICD-10-CM | POA: Diagnosis not present

## 2014-03-06 DIAGNOSIS — N186 End stage renal disease: Secondary | ICD-10-CM | POA: Diagnosis not present

## 2014-03-08 DIAGNOSIS — N186 End stage renal disease: Secondary | ICD-10-CM | POA: Diagnosis not present

## 2014-03-11 DIAGNOSIS — N186 End stage renal disease: Secondary | ICD-10-CM | POA: Diagnosis not present

## 2014-03-12 DIAGNOSIS — I2510 Atherosclerotic heart disease of native coronary artery without angina pectoris: Secondary | ICD-10-CM | POA: Diagnosis not present

## 2014-03-12 DIAGNOSIS — N186 End stage renal disease: Secondary | ICD-10-CM | POA: Diagnosis not present

## 2014-03-12 DIAGNOSIS — I5022 Chronic systolic (congestive) heart failure: Secondary | ICD-10-CM | POA: Diagnosis not present

## 2014-03-13 DIAGNOSIS — N186 End stage renal disease: Secondary | ICD-10-CM | POA: Diagnosis not present

## 2014-03-15 DIAGNOSIS — N186 End stage renal disease: Secondary | ICD-10-CM | POA: Diagnosis not present

## 2014-03-18 DIAGNOSIS — N186 End stage renal disease: Secondary | ICD-10-CM | POA: Diagnosis not present

## 2014-03-20 ENCOUNTER — Other Ambulatory Visit: Payer: Self-pay | Admitting: Nurse Practitioner

## 2014-03-20 DIAGNOSIS — N186 End stage renal disease: Secondary | ICD-10-CM | POA: Diagnosis not present

## 2014-03-22 DIAGNOSIS — N186 End stage renal disease: Secondary | ICD-10-CM | POA: Diagnosis not present

## 2014-03-25 DIAGNOSIS — N186 End stage renal disease: Secondary | ICD-10-CM | POA: Diagnosis not present

## 2014-03-25 DIAGNOSIS — D631 Anemia in chronic kidney disease: Secondary | ICD-10-CM | POA: Diagnosis not present

## 2014-04-22 DIAGNOSIS — N186 End stage renal disease: Secondary | ICD-10-CM | POA: Diagnosis not present

## 2014-04-24 DIAGNOSIS — N186 End stage renal disease: Secondary | ICD-10-CM | POA: Diagnosis not present

## 2014-04-24 DIAGNOSIS — N2581 Secondary hyperparathyroidism of renal origin: Secondary | ICD-10-CM | POA: Diagnosis not present

## 2014-05-01 ENCOUNTER — Encounter (HOSPITAL_COMMUNITY): Payer: Self-pay | Admitting: Cardiovascular Disease

## 2014-05-02 ENCOUNTER — Encounter (HOSPITAL_COMMUNITY): Payer: Self-pay | Admitting: Cardiovascular Disease

## 2014-05-14 DIAGNOSIS — I2510 Atherosclerotic heart disease of native coronary artery without angina pectoris: Secondary | ICD-10-CM | POA: Diagnosis not present

## 2014-05-14 DIAGNOSIS — N186 End stage renal disease: Secondary | ICD-10-CM | POA: Diagnosis not present

## 2014-05-14 DIAGNOSIS — I5022 Chronic systolic (congestive) heart failure: Secondary | ICD-10-CM | POA: Diagnosis not present

## 2014-05-23 DIAGNOSIS — Z992 Dependence on renal dialysis: Secondary | ICD-10-CM | POA: Diagnosis not present

## 2014-05-23 DIAGNOSIS — N186 End stage renal disease: Secondary | ICD-10-CM | POA: Diagnosis not present

## 2014-05-25 DIAGNOSIS — N2581 Secondary hyperparathyroidism of renal origin: Secondary | ICD-10-CM | POA: Diagnosis not present

## 2014-05-25 DIAGNOSIS — N186 End stage renal disease: Secondary | ICD-10-CM | POA: Diagnosis not present

## 2014-05-27 DIAGNOSIS — N2581 Secondary hyperparathyroidism of renal origin: Secondary | ICD-10-CM | POA: Diagnosis not present

## 2014-05-27 DIAGNOSIS — N186 End stage renal disease: Secondary | ICD-10-CM | POA: Diagnosis not present

## 2014-05-29 DIAGNOSIS — N186 End stage renal disease: Secondary | ICD-10-CM | POA: Diagnosis not present

## 2014-05-29 DIAGNOSIS — N2581 Secondary hyperparathyroidism of renal origin: Secondary | ICD-10-CM | POA: Diagnosis not present

## 2014-05-31 DIAGNOSIS — N2581 Secondary hyperparathyroidism of renal origin: Secondary | ICD-10-CM | POA: Diagnosis not present

## 2014-05-31 DIAGNOSIS — N186 End stage renal disease: Secondary | ICD-10-CM | POA: Diagnosis not present

## 2014-06-03 DIAGNOSIS — N186 End stage renal disease: Secondary | ICD-10-CM | POA: Diagnosis not present

## 2014-06-03 DIAGNOSIS — N2581 Secondary hyperparathyroidism of renal origin: Secondary | ICD-10-CM | POA: Diagnosis not present

## 2014-06-04 DIAGNOSIS — Z992 Dependence on renal dialysis: Secondary | ICD-10-CM | POA: Diagnosis not present

## 2014-06-04 DIAGNOSIS — N186 End stage renal disease: Secondary | ICD-10-CM | POA: Diagnosis not present

## 2014-06-04 DIAGNOSIS — I871 Compression of vein: Secondary | ICD-10-CM | POA: Diagnosis not present

## 2014-06-04 DIAGNOSIS — T82858D Stenosis of vascular prosthetic devices, implants and grafts, subsequent encounter: Secondary | ICD-10-CM | POA: Diagnosis not present

## 2014-06-05 DIAGNOSIS — N186 End stage renal disease: Secondary | ICD-10-CM | POA: Diagnosis not present

## 2014-06-05 DIAGNOSIS — N2581 Secondary hyperparathyroidism of renal origin: Secondary | ICD-10-CM | POA: Diagnosis not present

## 2014-06-07 DIAGNOSIS — N2581 Secondary hyperparathyroidism of renal origin: Secondary | ICD-10-CM | POA: Diagnosis not present

## 2014-06-07 DIAGNOSIS — N186 End stage renal disease: Secondary | ICD-10-CM | POA: Diagnosis not present

## 2014-06-10 DIAGNOSIS — N186 End stage renal disease: Secondary | ICD-10-CM | POA: Diagnosis not present

## 2014-06-10 DIAGNOSIS — N2581 Secondary hyperparathyroidism of renal origin: Secondary | ICD-10-CM | POA: Diagnosis not present

## 2014-06-12 DIAGNOSIS — N2581 Secondary hyperparathyroidism of renal origin: Secondary | ICD-10-CM | POA: Diagnosis not present

## 2014-06-12 DIAGNOSIS — N186 End stage renal disease: Secondary | ICD-10-CM | POA: Diagnosis not present

## 2014-06-14 DIAGNOSIS — N2581 Secondary hyperparathyroidism of renal origin: Secondary | ICD-10-CM | POA: Diagnosis not present

## 2014-06-14 DIAGNOSIS — N186 End stage renal disease: Secondary | ICD-10-CM | POA: Diagnosis not present

## 2014-06-17 DIAGNOSIS — N2581 Secondary hyperparathyroidism of renal origin: Secondary | ICD-10-CM | POA: Diagnosis not present

## 2014-06-17 DIAGNOSIS — N186 End stage renal disease: Secondary | ICD-10-CM | POA: Diagnosis not present

## 2014-06-19 DIAGNOSIS — N2581 Secondary hyperparathyroidism of renal origin: Secondary | ICD-10-CM | POA: Diagnosis not present

## 2014-06-19 DIAGNOSIS — N186 End stage renal disease: Secondary | ICD-10-CM | POA: Diagnosis not present

## 2014-06-21 DIAGNOSIS — N186 End stage renal disease: Secondary | ICD-10-CM | POA: Diagnosis not present

## 2014-06-21 DIAGNOSIS — N2581 Secondary hyperparathyroidism of renal origin: Secondary | ICD-10-CM | POA: Diagnosis not present

## 2014-06-23 DIAGNOSIS — N186 End stage renal disease: Secondary | ICD-10-CM | POA: Diagnosis not present

## 2014-06-23 DIAGNOSIS — Z992 Dependence on renal dialysis: Secondary | ICD-10-CM | POA: Diagnosis not present

## 2014-06-24 DIAGNOSIS — E1129 Type 2 diabetes mellitus with other diabetic kidney complication: Secondary | ICD-10-CM | POA: Diagnosis not present

## 2014-06-24 DIAGNOSIS — N2581 Secondary hyperparathyroidism of renal origin: Secondary | ICD-10-CM | POA: Diagnosis not present

## 2014-06-24 DIAGNOSIS — N186 End stage renal disease: Secondary | ICD-10-CM | POA: Diagnosis not present

## 2014-06-26 DIAGNOSIS — N2581 Secondary hyperparathyroidism of renal origin: Secondary | ICD-10-CM | POA: Diagnosis not present

## 2014-06-26 DIAGNOSIS — E1129 Type 2 diabetes mellitus with other diabetic kidney complication: Secondary | ICD-10-CM | POA: Diagnosis not present

## 2014-06-26 DIAGNOSIS — N186 End stage renal disease: Secondary | ICD-10-CM | POA: Diagnosis not present

## 2014-06-28 DIAGNOSIS — E1129 Type 2 diabetes mellitus with other diabetic kidney complication: Secondary | ICD-10-CM | POA: Diagnosis not present

## 2014-06-28 DIAGNOSIS — N2581 Secondary hyperparathyroidism of renal origin: Secondary | ICD-10-CM | POA: Diagnosis not present

## 2014-06-28 DIAGNOSIS — N186 End stage renal disease: Secondary | ICD-10-CM | POA: Diagnosis not present

## 2014-07-01 DIAGNOSIS — N2581 Secondary hyperparathyroidism of renal origin: Secondary | ICD-10-CM | POA: Diagnosis not present

## 2014-07-01 DIAGNOSIS — E1129 Type 2 diabetes mellitus with other diabetic kidney complication: Secondary | ICD-10-CM | POA: Diagnosis not present

## 2014-07-01 DIAGNOSIS — N186 End stage renal disease: Secondary | ICD-10-CM | POA: Diagnosis not present

## 2014-07-03 DIAGNOSIS — N2581 Secondary hyperparathyroidism of renal origin: Secondary | ICD-10-CM | POA: Diagnosis not present

## 2014-07-03 DIAGNOSIS — N186 End stage renal disease: Secondary | ICD-10-CM | POA: Diagnosis not present

## 2014-07-03 DIAGNOSIS — E1129 Type 2 diabetes mellitus with other diabetic kidney complication: Secondary | ICD-10-CM | POA: Diagnosis not present

## 2014-07-05 DIAGNOSIS — N2581 Secondary hyperparathyroidism of renal origin: Secondary | ICD-10-CM | POA: Diagnosis not present

## 2014-07-05 DIAGNOSIS — N186 End stage renal disease: Secondary | ICD-10-CM | POA: Diagnosis not present

## 2014-07-05 DIAGNOSIS — E1129 Type 2 diabetes mellitus with other diabetic kidney complication: Secondary | ICD-10-CM | POA: Diagnosis not present

## 2014-07-08 DIAGNOSIS — N186 End stage renal disease: Secondary | ICD-10-CM | POA: Diagnosis not present

## 2014-07-08 DIAGNOSIS — E1129 Type 2 diabetes mellitus with other diabetic kidney complication: Secondary | ICD-10-CM | POA: Diagnosis not present

## 2014-07-08 DIAGNOSIS — N2581 Secondary hyperparathyroidism of renal origin: Secondary | ICD-10-CM | POA: Diagnosis not present

## 2014-07-09 DIAGNOSIS — I340 Nonrheumatic mitral (valve) insufficiency: Secondary | ICD-10-CM | POA: Diagnosis not present

## 2014-07-09 DIAGNOSIS — I5022 Chronic systolic (congestive) heart failure: Secondary | ICD-10-CM | POA: Diagnosis not present

## 2014-07-09 DIAGNOSIS — I2510 Atherosclerotic heart disease of native coronary artery without angina pectoris: Secondary | ICD-10-CM | POA: Diagnosis not present

## 2014-07-10 DIAGNOSIS — N186 End stage renal disease: Secondary | ICD-10-CM | POA: Diagnosis not present

## 2014-07-10 DIAGNOSIS — N2581 Secondary hyperparathyroidism of renal origin: Secondary | ICD-10-CM | POA: Diagnosis not present

## 2014-07-10 DIAGNOSIS — E1129 Type 2 diabetes mellitus with other diabetic kidney complication: Secondary | ICD-10-CM | POA: Diagnosis not present

## 2014-07-12 DIAGNOSIS — E1129 Type 2 diabetes mellitus with other diabetic kidney complication: Secondary | ICD-10-CM | POA: Diagnosis not present

## 2014-07-12 DIAGNOSIS — N2581 Secondary hyperparathyroidism of renal origin: Secondary | ICD-10-CM | POA: Diagnosis not present

## 2014-07-12 DIAGNOSIS — N186 End stage renal disease: Secondary | ICD-10-CM | POA: Diagnosis not present

## 2014-07-15 DIAGNOSIS — N186 End stage renal disease: Secondary | ICD-10-CM | POA: Diagnosis not present

## 2014-07-15 DIAGNOSIS — N2581 Secondary hyperparathyroidism of renal origin: Secondary | ICD-10-CM | POA: Diagnosis not present

## 2014-07-15 DIAGNOSIS — E1129 Type 2 diabetes mellitus with other diabetic kidney complication: Secondary | ICD-10-CM | POA: Diagnosis not present

## 2014-07-17 DIAGNOSIS — E1129 Type 2 diabetes mellitus with other diabetic kidney complication: Secondary | ICD-10-CM | POA: Diagnosis not present

## 2014-07-17 DIAGNOSIS — N186 End stage renal disease: Secondary | ICD-10-CM | POA: Diagnosis not present

## 2014-07-17 DIAGNOSIS — N2581 Secondary hyperparathyroidism of renal origin: Secondary | ICD-10-CM | POA: Diagnosis not present

## 2014-07-19 DIAGNOSIS — E1129 Type 2 diabetes mellitus with other diabetic kidney complication: Secondary | ICD-10-CM | POA: Diagnosis not present

## 2014-07-19 DIAGNOSIS — N186 End stage renal disease: Secondary | ICD-10-CM | POA: Diagnosis not present

## 2014-07-19 DIAGNOSIS — N2581 Secondary hyperparathyroidism of renal origin: Secondary | ICD-10-CM | POA: Diagnosis not present

## 2014-07-22 DIAGNOSIS — Z992 Dependence on renal dialysis: Secondary | ICD-10-CM | POA: Diagnosis not present

## 2014-07-22 DIAGNOSIS — N2581 Secondary hyperparathyroidism of renal origin: Secondary | ICD-10-CM | POA: Diagnosis not present

## 2014-07-22 DIAGNOSIS — E1129 Type 2 diabetes mellitus with other diabetic kidney complication: Secondary | ICD-10-CM | POA: Diagnosis not present

## 2014-07-22 DIAGNOSIS — N186 End stage renal disease: Secondary | ICD-10-CM | POA: Diagnosis not present

## 2014-07-24 DIAGNOSIS — D509 Iron deficiency anemia, unspecified: Secondary | ICD-10-CM | POA: Diagnosis not present

## 2014-07-24 DIAGNOSIS — N2581 Secondary hyperparathyroidism of renal origin: Secondary | ICD-10-CM | POA: Diagnosis not present

## 2014-07-24 DIAGNOSIS — E1129 Type 2 diabetes mellitus with other diabetic kidney complication: Secondary | ICD-10-CM | POA: Diagnosis not present

## 2014-07-24 DIAGNOSIS — D631 Anemia in chronic kidney disease: Secondary | ICD-10-CM | POA: Diagnosis not present

## 2014-07-24 DIAGNOSIS — N186 End stage renal disease: Secondary | ICD-10-CM | POA: Diagnosis not present

## 2014-07-26 DIAGNOSIS — N2581 Secondary hyperparathyroidism of renal origin: Secondary | ICD-10-CM | POA: Diagnosis not present

## 2014-07-26 DIAGNOSIS — E1129 Type 2 diabetes mellitus with other diabetic kidney complication: Secondary | ICD-10-CM | POA: Diagnosis not present

## 2014-07-26 DIAGNOSIS — N186 End stage renal disease: Secondary | ICD-10-CM | POA: Diagnosis not present

## 2014-07-26 DIAGNOSIS — D631 Anemia in chronic kidney disease: Secondary | ICD-10-CM | POA: Diagnosis not present

## 2014-07-26 DIAGNOSIS — D509 Iron deficiency anemia, unspecified: Secondary | ICD-10-CM | POA: Diagnosis not present

## 2014-07-29 DIAGNOSIS — N186 End stage renal disease: Secondary | ICD-10-CM | POA: Diagnosis not present

## 2014-07-29 DIAGNOSIS — E1129 Type 2 diabetes mellitus with other diabetic kidney complication: Secondary | ICD-10-CM | POA: Diagnosis not present

## 2014-07-29 DIAGNOSIS — D509 Iron deficiency anemia, unspecified: Secondary | ICD-10-CM | POA: Diagnosis not present

## 2014-07-29 DIAGNOSIS — D631 Anemia in chronic kidney disease: Secondary | ICD-10-CM | POA: Diagnosis not present

## 2014-07-29 DIAGNOSIS — N2581 Secondary hyperparathyroidism of renal origin: Secondary | ICD-10-CM | POA: Diagnosis not present

## 2014-07-31 DIAGNOSIS — E1129 Type 2 diabetes mellitus with other diabetic kidney complication: Secondary | ICD-10-CM | POA: Diagnosis not present

## 2014-07-31 DIAGNOSIS — N2581 Secondary hyperparathyroidism of renal origin: Secondary | ICD-10-CM | POA: Diagnosis not present

## 2014-07-31 DIAGNOSIS — N186 End stage renal disease: Secondary | ICD-10-CM | POA: Diagnosis not present

## 2014-07-31 DIAGNOSIS — D631 Anemia in chronic kidney disease: Secondary | ICD-10-CM | POA: Diagnosis not present

## 2014-07-31 DIAGNOSIS — D509 Iron deficiency anemia, unspecified: Secondary | ICD-10-CM | POA: Diagnosis not present

## 2014-08-02 DIAGNOSIS — D509 Iron deficiency anemia, unspecified: Secondary | ICD-10-CM | POA: Diagnosis not present

## 2014-08-02 DIAGNOSIS — N2581 Secondary hyperparathyroidism of renal origin: Secondary | ICD-10-CM | POA: Diagnosis not present

## 2014-08-02 DIAGNOSIS — D631 Anemia in chronic kidney disease: Secondary | ICD-10-CM | POA: Diagnosis not present

## 2014-08-02 DIAGNOSIS — N186 End stage renal disease: Secondary | ICD-10-CM | POA: Diagnosis not present

## 2014-08-02 DIAGNOSIS — E1129 Type 2 diabetes mellitus with other diabetic kidney complication: Secondary | ICD-10-CM | POA: Diagnosis not present

## 2014-08-05 DIAGNOSIS — D509 Iron deficiency anemia, unspecified: Secondary | ICD-10-CM | POA: Diagnosis not present

## 2014-08-05 DIAGNOSIS — N2581 Secondary hyperparathyroidism of renal origin: Secondary | ICD-10-CM | POA: Diagnosis not present

## 2014-08-05 DIAGNOSIS — E1129 Type 2 diabetes mellitus with other diabetic kidney complication: Secondary | ICD-10-CM | POA: Diagnosis not present

## 2014-08-05 DIAGNOSIS — D631 Anemia in chronic kidney disease: Secondary | ICD-10-CM | POA: Diagnosis not present

## 2014-08-05 DIAGNOSIS — N186 End stage renal disease: Secondary | ICD-10-CM | POA: Diagnosis not present

## 2014-08-07 DIAGNOSIS — D631 Anemia in chronic kidney disease: Secondary | ICD-10-CM | POA: Diagnosis not present

## 2014-08-07 DIAGNOSIS — E1129 Type 2 diabetes mellitus with other diabetic kidney complication: Secondary | ICD-10-CM | POA: Diagnosis not present

## 2014-08-07 DIAGNOSIS — N2581 Secondary hyperparathyroidism of renal origin: Secondary | ICD-10-CM | POA: Diagnosis not present

## 2014-08-07 DIAGNOSIS — D509 Iron deficiency anemia, unspecified: Secondary | ICD-10-CM | POA: Diagnosis not present

## 2014-08-07 DIAGNOSIS — N186 End stage renal disease: Secondary | ICD-10-CM | POA: Diagnosis not present

## 2014-08-09 DIAGNOSIS — E1129 Type 2 diabetes mellitus with other diabetic kidney complication: Secondary | ICD-10-CM | POA: Diagnosis not present

## 2014-08-09 DIAGNOSIS — D509 Iron deficiency anemia, unspecified: Secondary | ICD-10-CM | POA: Diagnosis not present

## 2014-08-09 DIAGNOSIS — D631 Anemia in chronic kidney disease: Secondary | ICD-10-CM | POA: Diagnosis not present

## 2014-08-09 DIAGNOSIS — N2581 Secondary hyperparathyroidism of renal origin: Secondary | ICD-10-CM | POA: Diagnosis not present

## 2014-08-09 DIAGNOSIS — N186 End stage renal disease: Secondary | ICD-10-CM | POA: Diagnosis not present

## 2014-08-12 DIAGNOSIS — D631 Anemia in chronic kidney disease: Secondary | ICD-10-CM | POA: Diagnosis not present

## 2014-08-12 DIAGNOSIS — D509 Iron deficiency anemia, unspecified: Secondary | ICD-10-CM | POA: Diagnosis not present

## 2014-08-12 DIAGNOSIS — N2581 Secondary hyperparathyroidism of renal origin: Secondary | ICD-10-CM | POA: Diagnosis not present

## 2014-08-12 DIAGNOSIS — N186 End stage renal disease: Secondary | ICD-10-CM | POA: Diagnosis not present

## 2014-08-12 DIAGNOSIS — E1129 Type 2 diabetes mellitus with other diabetic kidney complication: Secondary | ICD-10-CM | POA: Diagnosis not present

## 2014-08-14 DIAGNOSIS — D631 Anemia in chronic kidney disease: Secondary | ICD-10-CM | POA: Diagnosis not present

## 2014-08-14 DIAGNOSIS — N2581 Secondary hyperparathyroidism of renal origin: Secondary | ICD-10-CM | POA: Diagnosis not present

## 2014-08-14 DIAGNOSIS — E1129 Type 2 diabetes mellitus with other diabetic kidney complication: Secondary | ICD-10-CM | POA: Diagnosis not present

## 2014-08-14 DIAGNOSIS — N186 End stage renal disease: Secondary | ICD-10-CM | POA: Diagnosis not present

## 2014-08-14 DIAGNOSIS — D509 Iron deficiency anemia, unspecified: Secondary | ICD-10-CM | POA: Diagnosis not present

## 2014-08-16 DIAGNOSIS — N186 End stage renal disease: Secondary | ICD-10-CM | POA: Diagnosis not present

## 2014-08-16 DIAGNOSIS — D509 Iron deficiency anemia, unspecified: Secondary | ICD-10-CM | POA: Diagnosis not present

## 2014-08-16 DIAGNOSIS — D631 Anemia in chronic kidney disease: Secondary | ICD-10-CM | POA: Diagnosis not present

## 2014-08-16 DIAGNOSIS — E1129 Type 2 diabetes mellitus with other diabetic kidney complication: Secondary | ICD-10-CM | POA: Diagnosis not present

## 2014-08-16 DIAGNOSIS — N2581 Secondary hyperparathyroidism of renal origin: Secondary | ICD-10-CM | POA: Diagnosis not present

## 2014-08-19 DIAGNOSIS — E1129 Type 2 diabetes mellitus with other diabetic kidney complication: Secondary | ICD-10-CM | POA: Diagnosis not present

## 2014-08-19 DIAGNOSIS — D509 Iron deficiency anemia, unspecified: Secondary | ICD-10-CM | POA: Diagnosis not present

## 2014-08-19 DIAGNOSIS — N2581 Secondary hyperparathyroidism of renal origin: Secondary | ICD-10-CM | POA: Diagnosis not present

## 2014-08-19 DIAGNOSIS — D631 Anemia in chronic kidney disease: Secondary | ICD-10-CM | POA: Diagnosis not present

## 2014-08-19 DIAGNOSIS — N186 End stage renal disease: Secondary | ICD-10-CM | POA: Diagnosis not present

## 2014-08-21 DIAGNOSIS — D631 Anemia in chronic kidney disease: Secondary | ICD-10-CM | POA: Diagnosis not present

## 2014-08-21 DIAGNOSIS — N186 End stage renal disease: Secondary | ICD-10-CM | POA: Diagnosis not present

## 2014-08-21 DIAGNOSIS — D509 Iron deficiency anemia, unspecified: Secondary | ICD-10-CM | POA: Diagnosis not present

## 2014-08-21 DIAGNOSIS — E1129 Type 2 diabetes mellitus with other diabetic kidney complication: Secondary | ICD-10-CM | POA: Diagnosis not present

## 2014-08-21 DIAGNOSIS — N2581 Secondary hyperparathyroidism of renal origin: Secondary | ICD-10-CM | POA: Diagnosis not present

## 2014-08-22 DIAGNOSIS — N186 End stage renal disease: Secondary | ICD-10-CM | POA: Diagnosis not present

## 2014-08-22 DIAGNOSIS — Z992 Dependence on renal dialysis: Secondary | ICD-10-CM | POA: Diagnosis not present

## 2014-08-23 DIAGNOSIS — E1129 Type 2 diabetes mellitus with other diabetic kidney complication: Secondary | ICD-10-CM | POA: Diagnosis not present

## 2014-08-23 DIAGNOSIS — L0390 Cellulitis, unspecified: Secondary | ICD-10-CM | POA: Diagnosis not present

## 2014-08-23 DIAGNOSIS — D509 Iron deficiency anemia, unspecified: Secondary | ICD-10-CM | POA: Diagnosis not present

## 2014-08-23 DIAGNOSIS — N2581 Secondary hyperparathyroidism of renal origin: Secondary | ICD-10-CM | POA: Diagnosis not present

## 2014-08-23 DIAGNOSIS — N186 End stage renal disease: Secondary | ICD-10-CM | POA: Diagnosis not present

## 2014-08-26 DIAGNOSIS — L0390 Cellulitis, unspecified: Secondary | ICD-10-CM | POA: Diagnosis not present

## 2014-08-26 DIAGNOSIS — N186 End stage renal disease: Secondary | ICD-10-CM | POA: Diagnosis not present

## 2014-08-26 DIAGNOSIS — N2581 Secondary hyperparathyroidism of renal origin: Secondary | ICD-10-CM | POA: Diagnosis not present

## 2014-08-26 DIAGNOSIS — D509 Iron deficiency anemia, unspecified: Secondary | ICD-10-CM | POA: Diagnosis not present

## 2014-08-26 DIAGNOSIS — E1129 Type 2 diabetes mellitus with other diabetic kidney complication: Secondary | ICD-10-CM | POA: Diagnosis not present

## 2014-08-28 DIAGNOSIS — N186 End stage renal disease: Secondary | ICD-10-CM | POA: Diagnosis not present

## 2014-08-28 DIAGNOSIS — N2581 Secondary hyperparathyroidism of renal origin: Secondary | ICD-10-CM | POA: Diagnosis not present

## 2014-08-28 DIAGNOSIS — E1129 Type 2 diabetes mellitus with other diabetic kidney complication: Secondary | ICD-10-CM | POA: Diagnosis not present

## 2014-08-28 DIAGNOSIS — D509 Iron deficiency anemia, unspecified: Secondary | ICD-10-CM | POA: Diagnosis not present

## 2014-08-28 DIAGNOSIS — L0390 Cellulitis, unspecified: Secondary | ICD-10-CM | POA: Diagnosis not present

## 2014-08-30 DIAGNOSIS — D509 Iron deficiency anemia, unspecified: Secondary | ICD-10-CM | POA: Diagnosis not present

## 2014-08-30 DIAGNOSIS — N2581 Secondary hyperparathyroidism of renal origin: Secondary | ICD-10-CM | POA: Diagnosis not present

## 2014-08-30 DIAGNOSIS — N186 End stage renal disease: Secondary | ICD-10-CM | POA: Diagnosis not present

## 2014-08-30 DIAGNOSIS — E1129 Type 2 diabetes mellitus with other diabetic kidney complication: Secondary | ICD-10-CM | POA: Diagnosis not present

## 2014-08-30 DIAGNOSIS — L0390 Cellulitis, unspecified: Secondary | ICD-10-CM | POA: Diagnosis not present

## 2014-09-02 ENCOUNTER — Encounter: Payer: Self-pay | Admitting: Vascular Surgery

## 2014-09-02 DIAGNOSIS — E1129 Type 2 diabetes mellitus with other diabetic kidney complication: Secondary | ICD-10-CM | POA: Diagnosis not present

## 2014-09-02 DIAGNOSIS — L0390 Cellulitis, unspecified: Secondary | ICD-10-CM | POA: Diagnosis not present

## 2014-09-02 DIAGNOSIS — N186 End stage renal disease: Secondary | ICD-10-CM | POA: Diagnosis not present

## 2014-09-02 DIAGNOSIS — N2581 Secondary hyperparathyroidism of renal origin: Secondary | ICD-10-CM | POA: Diagnosis not present

## 2014-09-02 DIAGNOSIS — D509 Iron deficiency anemia, unspecified: Secondary | ICD-10-CM | POA: Diagnosis not present

## 2014-09-03 ENCOUNTER — Ambulatory Visit (INDEPENDENT_AMBULATORY_CARE_PROVIDER_SITE_OTHER): Payer: Medicare Other | Admitting: Vascular Surgery

## 2014-09-03 ENCOUNTER — Encounter: Payer: Self-pay | Admitting: Vascular Surgery

## 2014-09-03 VITALS — BP 104/72 | HR 103 | Resp 18 | Ht 73.0 in | Wt 269.0 lb

## 2014-09-03 DIAGNOSIS — N186 End stage renal disease: Secondary | ICD-10-CM | POA: Diagnosis not present

## 2014-09-03 NOTE — Progress Notes (Signed)
The patient resents today for evaluation of his left arm AV fistula. He has had a long course of hemodialysis. He initially had a placement of AV fistula in 2012 by Dr. Geryl Councilman. He had good use of this. In December 2014 he had an area of bleeding and erosion over the midforearm portion of the fistula. He underwent a revision and plication of this area in January 2015 with Dr. Bridgett Larsson. He has done well since that time. In the last 1-2 weeks he had an episode of erythema over this was treated with a short course of antibiotics. Does have an eschar present over this as well and we are seeing him today for further discussion. He reports that the pain is completely resolved and that he has no evidence of inflammation around this.  Past Medical History  Diagnosis Date  . CHF (congestive heart failure)   . History of unilateral nephrectomy   . Hypertension   . Cardiomyopathy   . Gout   . Myocardial infarction ?2006  . Shortness of breath 05/19/11    "at rest, lying down, w/exertion"  . Umbilical hernia [id number]    unrepaired  . Stroke 02/2011    05/19/11 denies residual  . Dialysis patient     Mon-Wed-Fri  . Diabetes mellitus     pt. states he's borderline diabetic., no longer taking med,- off med. since 2013  . Renal cell carcinoma     dialysis- MWF  . ESRD (end stage renal disease)   . Coronary artery disease     normal coronaries by 10/10/08 cath  . History of nephrectomy 07/04/2012    History of right nephrectomy in 2002 for renal cell carcinoma   . 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 Smoking status: Never Smoker   . Smokeless tobacco: Never Used  . Alcohol Use: No    Family History  Problem Relation Age of Onset  . Hypertension Mother   . Kidney disease Mother     Allergies  Allergen Reactions  . Ace Inhibitors Cough     Current outpatient prescriptions:  .  amLODipine (NORVASC) 10 MG tablet, Take 10 mg by mouth daily  after lunch. , Disp: , Rfl:  .  aspirin 325 MG tablet, Take 325 mg by mouth daily. , Disp: , Rfl:  .  b complex-vitamin c-folic acid (NEPHRO-VITE) 0.8 MG TABS tablet, Take 1 tablet by mouth at bedtime., Disp: , Rfl:  .  calcium acetate (PHOSLO) 667 MG capsule, Take 667-2,001 mg by mouth 5 (five) times daily. Takes 2 capsules three times a day with meals.  Also takes 1-3 capsule with snacks, dose depends on what the snack is, Disp: , Rfl:  .  carvedilol (COREG) 25 MG tablet, Take 12.5 mg by mouth 2 (two) times daily with a meal., Disp: , Rfl:  .  HYDROcodone-acetaminophen (NORCO/VICODIN) 5-325 MG per tablet, Take 1 tablet by mouth every 6 (six) hours as needed for moderate pain., Disp: 30 tablet, Rfl: 0 .  isosorbide dinitrate (ISORDIL) 20 MG tablet, Take 20 mg by mouth 2 (two) times daily. , Disp: , Rfl:  .  valsartan (DIOVAN) 320 MG tablet, Take 320 mg by mouth daily., Disp: , Rfl:   Filed Vitals:   09/03/14 0928  BP: 104/72  Pulse: 103  Resp: 18  Height: 6\' 1"  (1.854 m)  Weight: 269 lb (122.018 kg)    Body mass  index is 35.5 kg/(m^2).       Physical exam he is a alert oriented moderately obese gentleman acute distress Respirations are nonlabored Left arm AV fistula has an excellent thrill. The area near the AV anastomosis at the wrist for access has a very superficial eschar present this is approximately 7 mm in diameter. A portion of this has lifted and healed with pink skin underneath. His not appear to be adherent to the vein under the spine by moving the skin.  Impression and plan excellent long-term use of left forearm fistula created in 2012. I do not feel that this involves the vein under the skin. This does appear to appear to be a superficial area of involvement. I have recommended that the dialysis staff continue to stay away from this area as they are currently doing for another several weeks and that should be able to use this area as well. Explained the symptoms and signs  of involvement to the deeper level with the patient and his family present. Will notify us should this occur. Otherwise will see [place] on as-needed basis

## 2014-09-04 DIAGNOSIS — E1129 Type 2 diabetes mellitus with other diabetic kidney complication: Secondary | ICD-10-CM | POA: Diagnosis not present

## 2014-09-04 DIAGNOSIS — L0390 Cellulitis, unspecified: Secondary | ICD-10-CM | POA: Diagnosis not present

## 2014-09-04 DIAGNOSIS — N186 End stage renal disease: Secondary | ICD-10-CM | POA: Diagnosis not present

## 2014-09-04 DIAGNOSIS — D509 Iron deficiency anemia, unspecified: Secondary | ICD-10-CM | POA: Diagnosis not present

## 2014-09-04 DIAGNOSIS — N2581 Secondary hyperparathyroidism of renal origin: Secondary | ICD-10-CM | POA: Diagnosis not present

## 2014-09-05 DIAGNOSIS — R509 Fever, unspecified: Secondary | ICD-10-CM | POA: Insufficient documentation

## 2014-09-05 DIAGNOSIS — R0602 Shortness of breath: Secondary | ICD-10-CM | POA: Insufficient documentation

## 2014-09-05 DIAGNOSIS — R197 Diarrhea, unspecified: Secondary | ICD-10-CM | POA: Insufficient documentation

## 2014-09-05 DIAGNOSIS — L299 Pruritus, unspecified: Secondary | ICD-10-CM | POA: Insufficient documentation

## 2014-09-06 DIAGNOSIS — L0390 Cellulitis, unspecified: Secondary | ICD-10-CM | POA: Diagnosis not present

## 2014-09-06 DIAGNOSIS — N2581 Secondary hyperparathyroidism of renal origin: Secondary | ICD-10-CM | POA: Diagnosis not present

## 2014-09-06 DIAGNOSIS — E1129 Type 2 diabetes mellitus with other diabetic kidney complication: Secondary | ICD-10-CM | POA: Diagnosis not present

## 2014-09-06 DIAGNOSIS — N186 End stage renal disease: Secondary | ICD-10-CM | POA: Diagnosis not present

## 2014-09-06 DIAGNOSIS — D509 Iron deficiency anemia, unspecified: Secondary | ICD-10-CM | POA: Diagnosis not present

## 2014-09-09 DIAGNOSIS — N2581 Secondary hyperparathyroidism of renal origin: Secondary | ICD-10-CM | POA: Diagnosis not present

## 2014-09-09 DIAGNOSIS — D509 Iron deficiency anemia, unspecified: Secondary | ICD-10-CM | POA: Diagnosis not present

## 2014-09-09 DIAGNOSIS — N186 End stage renal disease: Secondary | ICD-10-CM | POA: Diagnosis not present

## 2014-09-09 DIAGNOSIS — E1129 Type 2 diabetes mellitus with other diabetic kidney complication: Secondary | ICD-10-CM | POA: Diagnosis not present

## 2014-09-09 DIAGNOSIS — L0390 Cellulitis, unspecified: Secondary | ICD-10-CM | POA: Diagnosis not present

## 2014-09-11 DIAGNOSIS — N2581 Secondary hyperparathyroidism of renal origin: Secondary | ICD-10-CM | POA: Diagnosis not present

## 2014-09-11 DIAGNOSIS — N186 End stage renal disease: Secondary | ICD-10-CM | POA: Diagnosis not present

## 2014-09-11 DIAGNOSIS — L0390 Cellulitis, unspecified: Secondary | ICD-10-CM | POA: Diagnosis not present

## 2014-09-11 DIAGNOSIS — E1129 Type 2 diabetes mellitus with other diabetic kidney complication: Secondary | ICD-10-CM | POA: Diagnosis not present

## 2014-09-11 DIAGNOSIS — D509 Iron deficiency anemia, unspecified: Secondary | ICD-10-CM | POA: Diagnosis not present

## 2014-09-13 DIAGNOSIS — D509 Iron deficiency anemia, unspecified: Secondary | ICD-10-CM | POA: Diagnosis not present

## 2014-09-13 DIAGNOSIS — N186 End stage renal disease: Secondary | ICD-10-CM | POA: Diagnosis not present

## 2014-09-13 DIAGNOSIS — N2581 Secondary hyperparathyroidism of renal origin: Secondary | ICD-10-CM | POA: Diagnosis not present

## 2014-09-13 DIAGNOSIS — L0390 Cellulitis, unspecified: Secondary | ICD-10-CM | POA: Diagnosis not present

## 2014-09-13 DIAGNOSIS — E1129 Type 2 diabetes mellitus with other diabetic kidney complication: Secondary | ICD-10-CM | POA: Diagnosis not present

## 2014-09-16 DIAGNOSIS — E1129 Type 2 diabetes mellitus with other diabetic kidney complication: Secondary | ICD-10-CM | POA: Diagnosis not present

## 2014-09-16 DIAGNOSIS — N2581 Secondary hyperparathyroidism of renal origin: Secondary | ICD-10-CM | POA: Diagnosis not present

## 2014-09-16 DIAGNOSIS — N186 End stage renal disease: Secondary | ICD-10-CM | POA: Diagnosis not present

## 2014-09-16 DIAGNOSIS — L0390 Cellulitis, unspecified: Secondary | ICD-10-CM | POA: Diagnosis not present

## 2014-09-16 DIAGNOSIS — D509 Iron deficiency anemia, unspecified: Secondary | ICD-10-CM | POA: Diagnosis not present

## 2014-09-18 DIAGNOSIS — E1129 Type 2 diabetes mellitus with other diabetic kidney complication: Secondary | ICD-10-CM | POA: Diagnosis not present

## 2014-09-18 DIAGNOSIS — D509 Iron deficiency anemia, unspecified: Secondary | ICD-10-CM | POA: Diagnosis not present

## 2014-09-18 DIAGNOSIS — N186 End stage renal disease: Secondary | ICD-10-CM | POA: Diagnosis not present

## 2014-09-18 DIAGNOSIS — L0390 Cellulitis, unspecified: Secondary | ICD-10-CM | POA: Diagnosis not present

## 2014-09-18 DIAGNOSIS — N2581 Secondary hyperparathyroidism of renal origin: Secondary | ICD-10-CM | POA: Diagnosis not present

## 2014-09-19 DIAGNOSIS — E663 Overweight: Secondary | ICD-10-CM | POA: Diagnosis not present

## 2014-09-19 DIAGNOSIS — N186 End stage renal disease: Secondary | ICD-10-CM | POA: Diagnosis not present

## 2014-09-19 DIAGNOSIS — I2510 Atherosclerotic heart disease of native coronary artery without angina pectoris: Secondary | ICD-10-CM | POA: Diagnosis not present

## 2014-09-19 DIAGNOSIS — I5022 Chronic systolic (congestive) heart failure: Secondary | ICD-10-CM | POA: Diagnosis not present

## 2014-09-20 DIAGNOSIS — N2581 Secondary hyperparathyroidism of renal origin: Secondary | ICD-10-CM | POA: Diagnosis not present

## 2014-09-20 DIAGNOSIS — L0390 Cellulitis, unspecified: Secondary | ICD-10-CM | POA: Diagnosis not present

## 2014-09-20 DIAGNOSIS — N186 End stage renal disease: Secondary | ICD-10-CM | POA: Diagnosis not present

## 2014-09-20 DIAGNOSIS — E1129 Type 2 diabetes mellitus with other diabetic kidney complication: Secondary | ICD-10-CM | POA: Diagnosis not present

## 2014-09-20 DIAGNOSIS — D509 Iron deficiency anemia, unspecified: Secondary | ICD-10-CM | POA: Diagnosis not present

## 2014-09-21 DIAGNOSIS — N186 End stage renal disease: Secondary | ICD-10-CM | POA: Diagnosis not present

## 2014-09-21 DIAGNOSIS — Z992 Dependence on renal dialysis: Secondary | ICD-10-CM | POA: Diagnosis not present

## 2014-09-21 DIAGNOSIS — I120 Hypertensive chronic kidney disease with stage 5 chronic kidney disease or end stage renal disease: Secondary | ICD-10-CM | POA: Diagnosis not present

## 2014-09-23 DIAGNOSIS — N186 End stage renal disease: Secondary | ICD-10-CM | POA: Diagnosis not present

## 2014-09-23 DIAGNOSIS — N2581 Secondary hyperparathyroidism of renal origin: Secondary | ICD-10-CM | POA: Diagnosis not present

## 2014-09-23 DIAGNOSIS — D509 Iron deficiency anemia, unspecified: Secondary | ICD-10-CM | POA: Diagnosis not present

## 2014-09-24 DIAGNOSIS — E559 Vitamin D deficiency, unspecified: Secondary | ICD-10-CM | POA: Diagnosis not present

## 2014-09-24 DIAGNOSIS — D649 Anemia, unspecified: Secondary | ICD-10-CM | POA: Diagnosis not present

## 2014-09-24 DIAGNOSIS — N429 Disorder of prostate, unspecified: Secondary | ICD-10-CM | POA: Diagnosis not present

## 2014-09-24 DIAGNOSIS — E785 Hyperlipidemia, unspecified: Secondary | ICD-10-CM | POA: Diagnosis not present

## 2014-09-24 DIAGNOSIS — E786 Lipoprotein deficiency: Secondary | ICD-10-CM | POA: Diagnosis not present

## 2014-09-24 DIAGNOSIS — Z79899 Other long term (current) drug therapy: Secondary | ICD-10-CM | POA: Diagnosis not present

## 2014-09-25 DIAGNOSIS — N2581 Secondary hyperparathyroidism of renal origin: Secondary | ICD-10-CM | POA: Diagnosis not present

## 2014-09-25 DIAGNOSIS — D509 Iron deficiency anemia, unspecified: Secondary | ICD-10-CM | POA: Diagnosis not present

## 2014-09-25 DIAGNOSIS — N186 End stage renal disease: Secondary | ICD-10-CM | POA: Diagnosis not present

## 2014-09-27 DIAGNOSIS — N186 End stage renal disease: Secondary | ICD-10-CM | POA: Diagnosis not present

## 2014-09-27 DIAGNOSIS — D509 Iron deficiency anemia, unspecified: Secondary | ICD-10-CM | POA: Diagnosis not present

## 2014-09-27 DIAGNOSIS — N2581 Secondary hyperparathyroidism of renal origin: Secondary | ICD-10-CM | POA: Diagnosis not present

## 2014-09-30 DIAGNOSIS — N186 End stage renal disease: Secondary | ICD-10-CM | POA: Diagnosis not present

## 2014-09-30 DIAGNOSIS — N2581 Secondary hyperparathyroidism of renal origin: Secondary | ICD-10-CM | POA: Diagnosis not present

## 2014-09-30 DIAGNOSIS — D509 Iron deficiency anemia, unspecified: Secondary | ICD-10-CM | POA: Diagnosis not present

## 2014-10-02 DIAGNOSIS — D509 Iron deficiency anemia, unspecified: Secondary | ICD-10-CM | POA: Diagnosis not present

## 2014-10-02 DIAGNOSIS — N2581 Secondary hyperparathyroidism of renal origin: Secondary | ICD-10-CM | POA: Diagnosis not present

## 2014-10-02 DIAGNOSIS — N186 End stage renal disease: Secondary | ICD-10-CM | POA: Diagnosis not present

## 2014-10-04 DIAGNOSIS — N186 End stage renal disease: Secondary | ICD-10-CM | POA: Diagnosis not present

## 2014-10-04 DIAGNOSIS — D509 Iron deficiency anemia, unspecified: Secondary | ICD-10-CM | POA: Diagnosis not present

## 2014-10-04 DIAGNOSIS — N2581 Secondary hyperparathyroidism of renal origin: Secondary | ICD-10-CM | POA: Diagnosis not present

## 2014-10-07 DIAGNOSIS — N2581 Secondary hyperparathyroidism of renal origin: Secondary | ICD-10-CM | POA: Diagnosis not present

## 2014-10-07 DIAGNOSIS — N186 End stage renal disease: Secondary | ICD-10-CM | POA: Diagnosis not present

## 2014-10-07 DIAGNOSIS — D509 Iron deficiency anemia, unspecified: Secondary | ICD-10-CM | POA: Diagnosis not present

## 2014-10-09 DIAGNOSIS — N2581 Secondary hyperparathyroidism of renal origin: Secondary | ICD-10-CM | POA: Diagnosis not present

## 2014-10-09 DIAGNOSIS — N186 End stage renal disease: Secondary | ICD-10-CM | POA: Diagnosis not present

## 2014-10-09 DIAGNOSIS — D509 Iron deficiency anemia, unspecified: Secondary | ICD-10-CM | POA: Diagnosis not present

## 2014-10-11 DIAGNOSIS — N186 End stage renal disease: Secondary | ICD-10-CM | POA: Diagnosis not present

## 2014-10-11 DIAGNOSIS — D509 Iron deficiency anemia, unspecified: Secondary | ICD-10-CM | POA: Diagnosis not present

## 2014-10-11 DIAGNOSIS — N2581 Secondary hyperparathyroidism of renal origin: Secondary | ICD-10-CM | POA: Diagnosis not present

## 2014-10-14 DIAGNOSIS — N2581 Secondary hyperparathyroidism of renal origin: Secondary | ICD-10-CM | POA: Diagnosis not present

## 2014-10-14 DIAGNOSIS — D509 Iron deficiency anemia, unspecified: Secondary | ICD-10-CM | POA: Diagnosis not present

## 2014-10-14 DIAGNOSIS — N186 End stage renal disease: Secondary | ICD-10-CM | POA: Diagnosis not present

## 2014-10-15 ENCOUNTER — Other Ambulatory Visit: Payer: Self-pay | Admitting: Family Medicine

## 2014-10-15 DIAGNOSIS — R1013 Epigastric pain: Secondary | ICD-10-CM

## 2014-10-16 DIAGNOSIS — N2581 Secondary hyperparathyroidism of renal origin: Secondary | ICD-10-CM | POA: Diagnosis not present

## 2014-10-16 DIAGNOSIS — N186 End stage renal disease: Secondary | ICD-10-CM | POA: Diagnosis not present

## 2014-10-16 DIAGNOSIS — D509 Iron deficiency anemia, unspecified: Secondary | ICD-10-CM | POA: Diagnosis not present

## 2014-10-18 DIAGNOSIS — D509 Iron deficiency anemia, unspecified: Secondary | ICD-10-CM | POA: Diagnosis not present

## 2014-10-18 DIAGNOSIS — N186 End stage renal disease: Secondary | ICD-10-CM | POA: Diagnosis not present

## 2014-10-18 DIAGNOSIS — N2581 Secondary hyperparathyroidism of renal origin: Secondary | ICD-10-CM | POA: Diagnosis not present

## 2014-10-21 DIAGNOSIS — N2581 Secondary hyperparathyroidism of renal origin: Secondary | ICD-10-CM | POA: Diagnosis not present

## 2014-10-21 DIAGNOSIS — D509 Iron deficiency anemia, unspecified: Secondary | ICD-10-CM | POA: Diagnosis not present

## 2014-10-21 DIAGNOSIS — N186 End stage renal disease: Secondary | ICD-10-CM | POA: Diagnosis not present

## 2014-10-22 ENCOUNTER — Ambulatory Visit
Admission: RE | Admit: 2014-10-22 | Discharge: 2014-10-22 | Disposition: A | Discharge status: Home / Self Care | Payer: Medicare Other | Source: Ambulatory Visit | Attending: Family Medicine | Admitting: Family Medicine

## 2014-10-22 DIAGNOSIS — I120 Hypertensive chronic kidney disease with stage 5 chronic kidney disease or end stage renal disease: Secondary | ICD-10-CM | POA: Diagnosis not present

## 2014-10-22 DIAGNOSIS — K7689 Other specified diseases of liver: Secondary | ICD-10-CM | POA: Diagnosis not present

## 2014-10-22 DIAGNOSIS — K760 Fatty (change of) liver, not elsewhere classified: Secondary | ICD-10-CM | POA: Diagnosis not present

## 2014-10-22 DIAGNOSIS — N186 End stage renal disease: Secondary | ICD-10-CM | POA: Diagnosis not present

## 2014-10-22 DIAGNOSIS — R1013 Epigastric pain: Secondary | ICD-10-CM

## 2014-10-22 DIAGNOSIS — Z992 Dependence on renal dialysis: Secondary | ICD-10-CM | POA: Diagnosis not present

## 2014-10-23 DIAGNOSIS — N186 End stage renal disease: Secondary | ICD-10-CM | POA: Diagnosis not present

## 2014-10-23 DIAGNOSIS — E1129 Type 2 diabetes mellitus with other diabetic kidney complication: Secondary | ICD-10-CM | POA: Diagnosis not present

## 2014-10-23 DIAGNOSIS — N2581 Secondary hyperparathyroidism of renal origin: Secondary | ICD-10-CM | POA: Diagnosis not present

## 2014-10-25 DIAGNOSIS — E1129 Type 2 diabetes mellitus with other diabetic kidney complication: Secondary | ICD-10-CM | POA: Diagnosis not present

## 2014-10-25 DIAGNOSIS — N186 End stage renal disease: Secondary | ICD-10-CM | POA: Diagnosis not present

## 2014-10-25 DIAGNOSIS — N2581 Secondary hyperparathyroidism of renal origin: Secondary | ICD-10-CM | POA: Diagnosis not present

## 2014-10-28 DIAGNOSIS — E1129 Type 2 diabetes mellitus with other diabetic kidney complication: Secondary | ICD-10-CM | POA: Diagnosis not present

## 2014-10-28 DIAGNOSIS — N2581 Secondary hyperparathyroidism of renal origin: Secondary | ICD-10-CM | POA: Diagnosis not present

## 2014-10-28 DIAGNOSIS — N186 End stage renal disease: Secondary | ICD-10-CM | POA: Diagnosis not present

## 2014-10-30 DIAGNOSIS — N2581 Secondary hyperparathyroidism of renal origin: Secondary | ICD-10-CM | POA: Diagnosis not present

## 2014-10-30 DIAGNOSIS — N186 End stage renal disease: Secondary | ICD-10-CM | POA: Diagnosis not present

## 2014-10-30 DIAGNOSIS — E1129 Type 2 diabetes mellitus with other diabetic kidney complication: Secondary | ICD-10-CM | POA: Diagnosis not present

## 2014-11-01 DIAGNOSIS — N2581 Secondary hyperparathyroidism of renal origin: Secondary | ICD-10-CM | POA: Diagnosis not present

## 2014-11-01 DIAGNOSIS — E1129 Type 2 diabetes mellitus with other diabetic kidney complication: Secondary | ICD-10-CM | POA: Diagnosis not present

## 2014-11-01 DIAGNOSIS — N186 End stage renal disease: Secondary | ICD-10-CM | POA: Diagnosis not present

## 2014-11-04 DIAGNOSIS — N186 End stage renal disease: Secondary | ICD-10-CM | POA: Diagnosis not present

## 2014-11-04 DIAGNOSIS — N2581 Secondary hyperparathyroidism of renal origin: Secondary | ICD-10-CM | POA: Diagnosis not present

## 2014-11-04 DIAGNOSIS — E1129 Type 2 diabetes mellitus with other diabetic kidney complication: Secondary | ICD-10-CM | POA: Diagnosis not present

## 2014-11-05 DIAGNOSIS — N186 End stage renal disease: Secondary | ICD-10-CM | POA: Diagnosis not present

## 2014-11-05 DIAGNOSIS — R109 Unspecified abdominal pain: Secondary | ICD-10-CM | POA: Diagnosis not present

## 2014-11-05 DIAGNOSIS — Z6837 Body mass index (BMI) 37.0-37.9, adult: Secondary | ICD-10-CM | POA: Diagnosis not present

## 2014-11-05 DIAGNOSIS — E1122 Type 2 diabetes mellitus with diabetic chronic kidney disease: Secondary | ICD-10-CM | POA: Diagnosis not present

## 2014-11-06 DIAGNOSIS — N186 End stage renal disease: Secondary | ICD-10-CM | POA: Diagnosis not present

## 2014-11-06 DIAGNOSIS — E1129 Type 2 diabetes mellitus with other diabetic kidney complication: Secondary | ICD-10-CM | POA: Diagnosis not present

## 2014-11-06 DIAGNOSIS — N2581 Secondary hyperparathyroidism of renal origin: Secondary | ICD-10-CM | POA: Diagnosis not present

## 2014-11-08 DIAGNOSIS — E1129 Type 2 diabetes mellitus with other diabetic kidney complication: Secondary | ICD-10-CM | POA: Diagnosis not present

## 2014-11-08 DIAGNOSIS — N2581 Secondary hyperparathyroidism of renal origin: Secondary | ICD-10-CM | POA: Diagnosis not present

## 2014-11-08 DIAGNOSIS — N186 End stage renal disease: Secondary | ICD-10-CM | POA: Diagnosis not present

## 2014-11-11 DIAGNOSIS — E1129 Type 2 diabetes mellitus with other diabetic kidney complication: Secondary | ICD-10-CM | POA: Diagnosis not present

## 2014-11-11 DIAGNOSIS — N186 End stage renal disease: Secondary | ICD-10-CM | POA: Diagnosis not present

## 2014-11-11 DIAGNOSIS — N2581 Secondary hyperparathyroidism of renal origin: Secondary | ICD-10-CM | POA: Diagnosis not present

## 2014-11-13 DIAGNOSIS — N2581 Secondary hyperparathyroidism of renal origin: Secondary | ICD-10-CM | POA: Diagnosis not present

## 2014-11-13 DIAGNOSIS — E1129 Type 2 diabetes mellitus with other diabetic kidney complication: Secondary | ICD-10-CM | POA: Diagnosis not present

## 2014-11-13 DIAGNOSIS — N186 End stage renal disease: Secondary | ICD-10-CM | POA: Diagnosis not present

## 2014-11-15 DIAGNOSIS — N2581 Secondary hyperparathyroidism of renal origin: Secondary | ICD-10-CM | POA: Diagnosis not present

## 2014-11-15 DIAGNOSIS — N186 End stage renal disease: Secondary | ICD-10-CM | POA: Diagnosis not present

## 2014-11-15 DIAGNOSIS — E1129 Type 2 diabetes mellitus with other diabetic kidney complication: Secondary | ICD-10-CM | POA: Diagnosis not present

## 2014-11-18 DIAGNOSIS — N186 End stage renal disease: Secondary | ICD-10-CM | POA: Diagnosis not present

## 2014-11-18 DIAGNOSIS — E1129 Type 2 diabetes mellitus with other diabetic kidney complication: Secondary | ICD-10-CM | POA: Diagnosis not present

## 2014-11-18 DIAGNOSIS — N2581 Secondary hyperparathyroidism of renal origin: Secondary | ICD-10-CM | POA: Diagnosis not present

## 2014-11-20 DIAGNOSIS — N2581 Secondary hyperparathyroidism of renal origin: Secondary | ICD-10-CM | POA: Diagnosis not present

## 2014-11-20 DIAGNOSIS — E1129 Type 2 diabetes mellitus with other diabetic kidney complication: Secondary | ICD-10-CM | POA: Diagnosis not present

## 2014-11-20 DIAGNOSIS — N186 End stage renal disease: Secondary | ICD-10-CM | POA: Diagnosis not present

## 2014-11-21 DIAGNOSIS — Z992 Dependence on renal dialysis: Secondary | ICD-10-CM | POA: Diagnosis not present

## 2014-11-21 DIAGNOSIS — N186 End stage renal disease: Secondary | ICD-10-CM | POA: Diagnosis not present

## 2014-11-21 DIAGNOSIS — I120 Hypertensive chronic kidney disease with stage 5 chronic kidney disease or end stage renal disease: Secondary | ICD-10-CM | POA: Diagnosis not present

## 2014-11-22 DIAGNOSIS — N186 End stage renal disease: Secondary | ICD-10-CM | POA: Diagnosis not present

## 2014-11-22 DIAGNOSIS — E1129 Type 2 diabetes mellitus with other diabetic kidney complication: Secondary | ICD-10-CM | POA: Diagnosis not present

## 2014-11-22 DIAGNOSIS — N2581 Secondary hyperparathyroidism of renal origin: Secondary | ICD-10-CM | POA: Diagnosis not present

## 2014-11-25 DIAGNOSIS — E1129 Type 2 diabetes mellitus with other diabetic kidney complication: Secondary | ICD-10-CM | POA: Diagnosis not present

## 2014-11-25 DIAGNOSIS — N2581 Secondary hyperparathyroidism of renal origin: Secondary | ICD-10-CM | POA: Diagnosis not present

## 2014-11-25 DIAGNOSIS — N186 End stage renal disease: Secondary | ICD-10-CM | POA: Diagnosis not present

## 2014-11-27 DIAGNOSIS — N2581 Secondary hyperparathyroidism of renal origin: Secondary | ICD-10-CM | POA: Diagnosis not present

## 2014-11-27 DIAGNOSIS — N186 End stage renal disease: Secondary | ICD-10-CM | POA: Diagnosis not present

## 2014-11-27 DIAGNOSIS — E1129 Type 2 diabetes mellitus with other diabetic kidney complication: Secondary | ICD-10-CM | POA: Diagnosis not present

## 2014-11-28 DIAGNOSIS — I2510 Atherosclerotic heart disease of native coronary artery without angina pectoris: Secondary | ICD-10-CM | POA: Diagnosis not present

## 2014-11-28 DIAGNOSIS — I120 Hypertensive chronic kidney disease with stage 5 chronic kidney disease or end stage renal disease: Secondary | ICD-10-CM | POA: Diagnosis not present

## 2014-11-28 DIAGNOSIS — I5022 Chronic systolic (congestive) heart failure: Secondary | ICD-10-CM | POA: Diagnosis not present

## 2014-11-28 DIAGNOSIS — N186 End stage renal disease: Secondary | ICD-10-CM | POA: Diagnosis not present

## 2014-11-29 DIAGNOSIS — E1129 Type 2 diabetes mellitus with other diabetic kidney complication: Secondary | ICD-10-CM | POA: Diagnosis not present

## 2014-11-29 DIAGNOSIS — N186 End stage renal disease: Secondary | ICD-10-CM | POA: Diagnosis not present

## 2014-11-29 DIAGNOSIS — N2581 Secondary hyperparathyroidism of renal origin: Secondary | ICD-10-CM | POA: Diagnosis not present

## 2014-12-02 DIAGNOSIS — E1129 Type 2 diabetes mellitus with other diabetic kidney complication: Secondary | ICD-10-CM | POA: Diagnosis not present

## 2014-12-02 DIAGNOSIS — N2581 Secondary hyperparathyroidism of renal origin: Secondary | ICD-10-CM | POA: Diagnosis not present

## 2014-12-02 DIAGNOSIS — N186 End stage renal disease: Secondary | ICD-10-CM | POA: Diagnosis not present

## 2014-12-04 DIAGNOSIS — E1129 Type 2 diabetes mellitus with other diabetic kidney complication: Secondary | ICD-10-CM | POA: Diagnosis not present

## 2014-12-04 DIAGNOSIS — N2581 Secondary hyperparathyroidism of renal origin: Secondary | ICD-10-CM | POA: Diagnosis not present

## 2014-12-04 DIAGNOSIS — N186 End stage renal disease: Secondary | ICD-10-CM | POA: Diagnosis not present

## 2014-12-06 DIAGNOSIS — E1129 Type 2 diabetes mellitus with other diabetic kidney complication: Secondary | ICD-10-CM | POA: Diagnosis not present

## 2014-12-06 DIAGNOSIS — N186 End stage renal disease: Secondary | ICD-10-CM | POA: Diagnosis not present

## 2014-12-06 DIAGNOSIS — N2581 Secondary hyperparathyroidism of renal origin: Secondary | ICD-10-CM | POA: Diagnosis not present

## 2014-12-09 DIAGNOSIS — E1129 Type 2 diabetes mellitus with other diabetic kidney complication: Secondary | ICD-10-CM | POA: Diagnosis not present

## 2014-12-09 DIAGNOSIS — N2581 Secondary hyperparathyroidism of renal origin: Secondary | ICD-10-CM | POA: Diagnosis not present

## 2014-12-09 DIAGNOSIS — N186 End stage renal disease: Secondary | ICD-10-CM | POA: Diagnosis not present

## 2014-12-11 DIAGNOSIS — E1129 Type 2 diabetes mellitus with other diabetic kidney complication: Secondary | ICD-10-CM | POA: Diagnosis not present

## 2014-12-11 DIAGNOSIS — N2581 Secondary hyperparathyroidism of renal origin: Secondary | ICD-10-CM | POA: Diagnosis not present

## 2014-12-11 DIAGNOSIS — N186 End stage renal disease: Secondary | ICD-10-CM | POA: Diagnosis not present

## 2014-12-13 DIAGNOSIS — E1129 Type 2 diabetes mellitus with other diabetic kidney complication: Secondary | ICD-10-CM | POA: Diagnosis not present

## 2014-12-13 DIAGNOSIS — N2581 Secondary hyperparathyroidism of renal origin: Secondary | ICD-10-CM | POA: Diagnosis not present

## 2014-12-13 DIAGNOSIS — N186 End stage renal disease: Secondary | ICD-10-CM | POA: Diagnosis not present

## 2014-12-16 DIAGNOSIS — N186 End stage renal disease: Secondary | ICD-10-CM | POA: Diagnosis not present

## 2014-12-16 DIAGNOSIS — E1129 Type 2 diabetes mellitus with other diabetic kidney complication: Secondary | ICD-10-CM | POA: Diagnosis not present

## 2014-12-16 DIAGNOSIS — N2581 Secondary hyperparathyroidism of renal origin: Secondary | ICD-10-CM | POA: Diagnosis not present

## 2014-12-18 ENCOUNTER — Emergency Department (HOSPITAL_COMMUNITY): Payer: Medicare Other

## 2014-12-18 ENCOUNTER — Inpatient Hospital Stay (HOSPITAL_COMMUNITY)
Admission: EM | Admit: 2014-12-18 | Discharge: 2014-12-21 | DRG: 391 | Disposition: A | Discharge status: Home / Self Care | Payer: Medicare Other | Attending: Internal Medicine | Admitting: Internal Medicine

## 2014-12-18 ENCOUNTER — Encounter (HOSPITAL_COMMUNITY): Payer: Self-pay | Admitting: Vascular Surgery

## 2014-12-18 DIAGNOSIS — I120 Hypertensive chronic kidney disease with stage 5 chronic kidney disease or end stage renal disease: Secondary | ICD-10-CM | POA: Diagnosis present

## 2014-12-18 DIAGNOSIS — I252 Old myocardial infarction: Secondary | ICD-10-CM | POA: Diagnosis not present

## 2014-12-18 DIAGNOSIS — E119 Type 2 diabetes mellitus without complications: Secondary | ICD-10-CM | POA: Diagnosis not present

## 2014-12-18 DIAGNOSIS — D631 Anemia in chronic kidney disease: Secondary | ICD-10-CM | POA: Diagnosis present

## 2014-12-18 DIAGNOSIS — I2510 Atherosclerotic heart disease of native coronary artery without angina pectoris: Secondary | ICD-10-CM | POA: Diagnosis present

## 2014-12-18 DIAGNOSIS — Z888 Allergy status to other drugs, medicaments and biological substances status: Secondary | ICD-10-CM

## 2014-12-18 DIAGNOSIS — E8889 Other specified metabolic disorders: Secondary | ICD-10-CM | POA: Diagnosis present

## 2014-12-18 DIAGNOSIS — I5042 Chronic combined systolic (congestive) and diastolic (congestive) heart failure: Secondary | ICD-10-CM | POA: Diagnosis present

## 2014-12-18 DIAGNOSIS — K769 Liver disease, unspecified: Secondary | ICD-10-CM | POA: Diagnosis not present

## 2014-12-18 DIAGNOSIS — Z992 Dependence on renal dialysis: Secondary | ICD-10-CM

## 2014-12-18 DIAGNOSIS — I69351 Hemiplegia and hemiparesis following cerebral infarction affecting right dominant side: Secondary | ICD-10-CM | POA: Diagnosis not present

## 2014-12-18 DIAGNOSIS — Z905 Acquired absence of kidney: Secondary | ICD-10-CM | POA: Diagnosis not present

## 2014-12-18 DIAGNOSIS — I429 Cardiomyopathy, unspecified: Secondary | ICD-10-CM | POA: Diagnosis present

## 2014-12-18 DIAGNOSIS — K5900 Constipation, unspecified: Secondary | ICD-10-CM | POA: Diagnosis present

## 2014-12-18 DIAGNOSIS — K7689 Other specified diseases of liver: Secondary | ICD-10-CM | POA: Diagnosis not present

## 2014-12-18 DIAGNOSIS — H5441 Blindness, right eye, normal vision left eye: Secondary | ICD-10-CM | POA: Diagnosis present

## 2014-12-18 DIAGNOSIS — I5022 Chronic systolic (congestive) heart failure: Secondary | ICD-10-CM | POA: Diagnosis present

## 2014-12-18 DIAGNOSIS — K5732 Diverticulitis of large intestine without perforation or abscess without bleeding: Secondary | ICD-10-CM | POA: Diagnosis not present

## 2014-12-18 DIAGNOSIS — M109 Gout, unspecified: Secondary | ICD-10-CM | POA: Diagnosis present

## 2014-12-18 DIAGNOSIS — Z85528 Personal history of other malignant neoplasm of kidney: Secondary | ICD-10-CM | POA: Diagnosis not present

## 2014-12-18 DIAGNOSIS — N2581 Secondary hyperparathyroidism of renal origin: Secondary | ICD-10-CM | POA: Diagnosis present

## 2014-12-18 DIAGNOSIS — Z7982 Long term (current) use of aspirin: Secondary | ICD-10-CM | POA: Diagnosis not present

## 2014-12-18 DIAGNOSIS — R531 Weakness: Secondary | ICD-10-CM | POA: Diagnosis present

## 2014-12-18 DIAGNOSIS — R109 Unspecified abdominal pain: Secondary | ICD-10-CM | POA: Diagnosis not present

## 2014-12-18 DIAGNOSIS — E1129 Type 2 diabetes mellitus with other diabetic kidney complication: Secondary | ICD-10-CM | POA: Diagnosis not present

## 2014-12-18 DIAGNOSIS — N186 End stage renal disease: Secondary | ICD-10-CM | POA: Diagnosis present

## 2014-12-18 DIAGNOSIS — N261 Atrophy of kidney (terminal): Secondary | ICD-10-CM | POA: Diagnosis not present

## 2014-12-18 DIAGNOSIS — E1122 Type 2 diabetes mellitus with diabetic chronic kidney disease: Secondary | ICD-10-CM | POA: Diagnosis present

## 2014-12-18 LAB — HEPATIC FUNCTION PANEL
ALT: 8 U/L — ABNORMAL LOW (ref 17–63)
AST: 14 U/L — ABNORMAL LOW (ref 15–41)
Albumin: 3.1 g/dL — ABNORMAL LOW (ref 3.5–5.0)
Alkaline Phosphatase: 51 U/L (ref 38–126)
Bilirubin, Direct: 0.2 mg/dL (ref 0.1–0.5)
Indirect Bilirubin: 0.6 mg/dL (ref 0.3–0.9)
Total Bilirubin: 0.8 mg/dL (ref 0.3–1.2)
Total Protein: 7.6 g/dL (ref 6.5–8.1)

## 2014-12-18 LAB — CBC WITH DIFFERENTIAL/PLATELET
Basophils Absolute: 0 10*3/uL (ref 0.0–0.1)
Basophils Relative: 0 % (ref 0–1)
Eosinophils Absolute: 0.1 10*3/uL (ref 0.0–0.7)
Eosinophils Relative: 2 % (ref 0–5)
HCT: 33.5 % — ABNORMAL LOW (ref 39.0–52.0)
Hemoglobin: 11.1 g/dL — ABNORMAL LOW (ref 13.0–17.0)
Lymphocytes Relative: 16 % (ref 12–46)
Lymphs Abs: 0.9 10*3/uL (ref 0.7–4.0)
MCH: 31.3 pg (ref 26.0–34.0)
MCHC: 33.1 g/dL (ref 30.0–36.0)
MCV: 94.4 fL (ref 78.0–100.0)
Monocytes Absolute: 0.8 10*3/uL (ref 0.1–1.0)
Monocytes Relative: 15 % — ABNORMAL HIGH (ref 3–12)
Neutro Abs: 3.5 10*3/uL (ref 1.7–7.7)
Neutrophils Relative %: 67 % (ref 43–77)
Platelets: 181 10*3/uL (ref 150–400)
RBC: 3.55 MIL/uL — ABNORMAL LOW (ref 4.22–5.81)
RDW: 13.8 % (ref 11.5–15.5)
WBC: 5.3 10*3/uL (ref 4.0–10.5)

## 2014-12-18 LAB — BASIC METABOLIC PANEL
Anion gap: 9 (ref 5–15)
BUN: 10 mg/dL (ref 6–20)
CO2: 33 mmol/L — ABNORMAL HIGH (ref 22–32)
Calcium: 9.2 mg/dL (ref 8.9–10.3)
Chloride: 92 mmol/L — ABNORMAL LOW (ref 101–111)
Creatinine, Ser: 7.07 mg/dL — ABNORMAL HIGH (ref 0.61–1.24)
GFR calc Af Amer: 9 mL/min — ABNORMAL LOW (ref >60)
GFR calc non Af Amer: 8 mL/min — ABNORMAL LOW (ref >60)
Glucose, Bld: 78 mg/dL (ref 65–99)
Potassium: 4.1 mmol/L (ref 3.5–5.1)
Sodium: 134 mmol/L — ABNORMAL LOW (ref 135–145)

## 2014-12-18 LAB — GLUCOSE, CAPILLARY: Glucose-Capillary: 89 mg/dL (ref 65–99)

## 2014-12-18 LAB — LIPASE, BLOOD: Lipase: 18 U/L — ABNORMAL LOW (ref 22–51)

## 2014-12-18 MED ORDER — HYDROMORPHONE HCL 1 MG/ML IJ SOLN
0.5000 mg | Freq: Once | INTRAMUSCULAR | Status: AC
  Administered 2014-12-18: 0.5 mg via INTRAVENOUS
  Filled 2014-12-18: qty 1

## 2014-12-18 MED ORDER — AMLODIPINE BESYLATE 10 MG PO TABS
10.0000 mg | ORAL_TABLET | Freq: Every day | ORAL | Status: DC
  Administered 2014-12-19: 10 mg via ORAL
  Filled 2014-12-18 (×3): qty 1

## 2014-12-18 MED ORDER — ONDANSETRON HCL 4 MG/2ML IJ SOLN
4.0000 mg | Freq: Once | INTRAMUSCULAR | Status: AC
  Administered 2014-12-18: 4 mg via INTRAVENOUS
  Filled 2014-12-18: qty 2

## 2014-12-18 MED ORDER — DOCUSATE SODIUM 100 MG PO CAPS
100.0000 mg | ORAL_CAPSULE | Freq: Two times a day (BID) | ORAL | Status: DC
  Administered 2014-12-19 – 2014-12-21 (×4): 100 mg via ORAL
  Filled 2014-12-18 (×5): qty 1

## 2014-12-18 MED ORDER — ISOSORBIDE DINITRATE 10 MG PO TABS
20.0000 mg | ORAL_TABLET | Freq: Two times a day (BID) | ORAL | Status: DC
  Administered 2014-12-18 – 2014-12-21 (×5): 20 mg via ORAL
  Filled 2014-12-18 (×6): qty 2

## 2014-12-18 MED ORDER — POLYETHYLENE GLYCOL 3350 17 GM/SCOOP PO POWD
0.5000 | Freq: Once | ORAL | Status: AC
  Administered 2014-12-18: 127.5 g via ORAL
  Filled 2014-12-18: qty 255

## 2014-12-18 MED ORDER — HYDROMORPHONE HCL 1 MG/ML IJ SOLN
0.5000 mg | INTRAMUSCULAR | Status: DC | PRN
  Administered 2014-12-18 – 2014-12-21 (×12): 0.5 mg via INTRAVENOUS
  Filled 2014-12-18 (×13): qty 1

## 2014-12-18 MED ORDER — ASPIRIN 325 MG PO TABS
325.0000 mg | ORAL_TABLET | Freq: Every day | ORAL | Status: DC
  Administered 2014-12-18 – 2014-12-21 (×3): 325 mg via ORAL
  Filled 2014-12-18 (×5): qty 1

## 2014-12-18 MED ORDER — HEPARIN SODIUM (PORCINE) 5000 UNIT/ML IJ SOLN
5000.0000 [IU] | Freq: Three times a day (TID) | INTRAMUSCULAR | Status: DC
Start: 2014-12-18 — End: 2014-12-21
  Administered 2014-12-18 – 2014-12-21 (×7): 5000 [IU] via SUBCUTANEOUS
  Filled 2014-12-18 (×2): qty 1

## 2014-12-18 MED ORDER — SODIUM CHLORIDE 0.9 % IJ SOLN
3.0000 mL | Freq: Two times a day (BID) | INTRAMUSCULAR | Status: DC
  Administered 2014-12-18 – 2014-12-21 (×4): 3 mL via INTRAVENOUS

## 2014-12-18 MED ORDER — ACETAMINOPHEN 650 MG RE SUPP: 650.0000 mg | Freq: Four times a day (QID) | RECTAL | Status: DC | PRN

## 2014-12-18 MED ORDER — CIPROFLOXACIN IN D5W 400 MG/200ML IV SOLN
400.0000 mg | Freq: Once | INTRAVENOUS | Status: AC
  Administered 2014-12-18: 400 mg via INTRAVENOUS
  Filled 2014-12-18: qty 200

## 2014-12-18 MED ORDER — METRONIDAZOLE IN NACL 5-0.79 MG/ML-% IV SOLN
500.0000 mg | Freq: Once | INTRAVENOUS | Status: AC
  Administered 2014-12-18: 500 mg via INTRAVENOUS
  Filled 2014-12-18: qty 100

## 2014-12-18 MED ORDER — ONDANSETRON HCL 4 MG/2ML IJ SOLN
4.0000 mg | Freq: Four times a day (QID) | INTRAMUSCULAR | Status: DC | PRN
  Administered 2014-12-19: 4 mg via INTRAVENOUS
  Filled 2014-12-18 (×3): qty 2

## 2014-12-18 MED ORDER — IRBESARTAN 300 MG PO TABS
300.0000 mg | ORAL_TABLET | Freq: Every day | ORAL | Status: DC
  Administered 2014-12-18: 300 mg via ORAL
  Filled 2014-12-18 (×2): qty 1

## 2014-12-18 MED ORDER — ACETAMINOPHEN 325 MG PO TABS: 650.0000 mg | ORAL_TABLET | Freq: Four times a day (QID) | ORAL | Status: DC | PRN

## 2014-12-18 MED ORDER — CIPROFLOXACIN IN D5W 400 MG/200ML IV SOLN
400.0000 mg | INTRAVENOUS | Status: DC
  Administered 2014-12-18 – 2014-12-20 (×3): 400 mg via INTRAVENOUS
  Filled 2014-12-18 (×3): qty 200

## 2014-12-18 MED ORDER — FENTANYL CITRATE (PF) 100 MCG/2ML IJ SOLN
100.0000 ug | Freq: Once | INTRAMUSCULAR | Status: AC
  Administered 2014-12-18: 100 ug via INTRAVENOUS
  Filled 2014-12-18: qty 2

## 2014-12-18 MED ORDER — CALCIUM ACETATE (PHOS BINDER) 667 MG PO CAPS: 667.0000 mg | ORAL_CAPSULE | Freq: Two times a day (BID) | ORAL | Status: DC | PRN

## 2014-12-18 MED ORDER — CALCIUM ACETATE (PHOS BINDER) 667 MG PO CAPS
1334.0000 mg | ORAL_CAPSULE | Freq: Three times a day (TID) | ORAL | Status: DC
  Administered 2014-12-19 – 2014-12-21 (×4): 1334 mg via ORAL
  Filled 2014-12-18 (×4): qty 2

## 2014-12-18 MED ORDER — METRONIDAZOLE IN NACL 5-0.79 MG/ML-% IV SOLN
500.0000 mg | Freq: Three times a day (TID) | INTRAVENOUS | Status: DC
  Administered 2014-12-19 – 2014-12-21 (×6): 500 mg via INTRAVENOUS
  Filled 2014-12-18 (×8): qty 100

## 2014-12-18 MED ORDER — CALCIUM ACETATE 667 MG PO CAPS: 667.0000 mg | ORAL_CAPSULE | Freq: Every day | ORAL | Status: DC

## 2014-12-18 MED ORDER — CARVEDILOL 12.5 MG PO TABS
12.5000 mg | ORAL_TABLET | Freq: Two times a day (BID) | ORAL | Status: DC
  Administered 2014-12-19: 12.5 mg via ORAL
  Filled 2014-12-18: qty 1

## 2014-12-18 MED ORDER — ONDANSETRON HCL 4 MG PO TABS
4.0000 mg | ORAL_TABLET | Freq: Four times a day (QID) | ORAL | Status: DC | PRN
  Administered 2014-12-19: 4 mg via ORAL

## 2014-12-18 NOTE — Progress Notes (Signed)
Received report from Cassie and at the end of report realized patient was a hemodialysis patient. Awaiting word back from [facility] in patient placement for bed on renal unit.

## 2014-12-18 NOTE — Progress Notes (Signed)
ANTIBIOTIC CONSULT NOTE - INITIAL  Pharmacy Consult for ciprofloxacin Indication: intra-abdominal infection  Allergies  Allergen Reactions  . Ace Inhibitors Cough    Patient Measurements: Height: 6\' 1"  (185.4 cm) Weight: 265 lb (120.203 kg) IBW/kg (Calculated) : 79.9   Vital Signs: Temp: 98.2 F (36.8 C) (07/27 1702) Temp Source: Oral (07/27 1702) BP: 131/73 mmHg (07/27 1702) Pulse Rate: 74 (07/27 1702) Intake/Output from previous day:   Intake/Output from this shift:    Labs:  Recent Labs  12/18/14 1226  WBC 5.3  HGB 11.1*  PLT 181  CREATININE 7.07*   Estimated Creatinine Clearance: 16.8 mL/min (by C-G formula based on Cr of 7.07). No results for input(s): VANCOTROUGH, VANCOPEAK, VANCORANDOM, GENTTROUGH, GENTPEAK, GENTRANDOM, TOBRATROUGH, TOBRAPEAK, TOBRARND, AMIKACINPEAK, AMIKACINTROU, AMIKACIN in the last 72 hours.   Microbiology: No results found for this or any previous visit (from the past 720 hour(s)).  Medical History: Past Medical History  Diagnosis Date  . CHF (congestive heart failure)   . History of unilateral nephrectomy   . Hypertension   . Cardiomyopathy   . Gout   . Myocardial infarction ?2006  . Shortness of breath 05/19/11    "at rest, lying down, w/exertion"  . Umbilical hernia [id number]    unrepaired  . Stroke 02/2011    05/19/11 denies residual  . Dialysis patient     Mon-Wed-Fri  . Diabetes mellitus     pt. states he's borderline diabetic., no longer taking med,- off med. since 2013  . Renal cell carcinoma     dialysis- MWF  . ESRD (end stage renal disease)   . Coronary artery disease     normal coronaries by 10/10/08 cath  . History of nephrectomy 07/04/2012    History of right nephrectomy in 2002 for renal cell carcinoma   . 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 Has had abdominal pain, constipation and vomiting x6 days and found to  have diverticulitis. To start ciprofloxacin and Flagyl.  Goal of Therapy:  proper dosing based on renal and hepatic function  Plan:  -Ciprofloxacin 400mg  IV q24h -Flagyl 500mg  IV q8h as ordered by MD is appropriate -follow clinical progression, changes to HD schedule  Althia Egolf D. Fumiko Cham, PharmD, BCPS Clinical Pharmacist Pager: [phone] 12/18/2014 6:17 PM

## 2014-12-18 NOTE — ED Notes (Signed)
Report given to Tina  ?

## 2014-12-18 NOTE — ED Provider Notes (Signed)
CSN: [id number]     Arrival date & time 12/18/14  1045 History   First MD Initiated Contact with Patient 12/18/14 1124     Chief Complaint  Patient presents with  . Abdominal Pain  . Constipation     (Consider location/radiation/quality/duration/timing/severity/associated sxs/prior Treatment) Patient is a 54 y.o. male presenting with abdominal pain. The history is provided by the patient.  Abdominal Pain Pain location:  Generalized Pain quality: aching   Pain quality: not dull, not heavy, no pressure and not sharp   Pain radiates to:  Does not radiate Pain severity:  Mild Associated symptoms: constipation, nausea and vomiting   Associated symptoms: no chills, no cough, no diarrhea and no fever    54 year old male history of CHF, diabetes presents to the emergency department today with constipation and bloating. States that he has been having decreased bowel movements for approximately 6 days. It has been harder than normal during that time. Still passing lots of gas which relieves his symptoms. Also has had a couple episodes of vomiting which also helped relieve his symptoms. Has had decreased fluid intake and has been slightly underweight at recent dialysis treatments. Has had history of constipation before but not for this long. No recent fevers or other associated symptoms.  Past Medical History  Diagnosis Date  . CHF (congestive heart failure)   . History of unilateral nephrectomy   . Hypertension   . Cardiomyopathy   . Gout   . Myocardial infarction ?2006  . Shortness of breath 05/19/11    "at rest, lying down, w/exertion"  . Umbilical hernia [id number]    unrepaired  . Stroke 02/2011    05/19/11 denies residual  . Dialysis patient     Mon-Wed-Fri  . Diabetes mellitus     pt. states he's borderline diabetic., no longer taking med,- off med. since 2013  . Renal cell carcinoma     dialysis- MWF  . ESRD (end stage renal disease)   . Coronary artery disease     normal  coronaries by 10/10/08 cath  . History of nephrectomy 07/04/2012    History of right nephrectomy in 2002 for renal cell carcinoma   . 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 Nephrectomy  2000    right  . Av fistula placement  03/29/2011    Procedure: ARTERIOVENOUS (AV) FISTULA CREATION;  Surgeon: Hinda Lenis, MD;  Location: [place];  Service: Vascular;  Laterality: Left;  LEFT RADIOCEPHALIC , Arteriovenous [id number])  . Smashed  1990's    "left pinky; have a plate in there"  . US echocardiography  05/20/11  . Cardiac catheterization  05/21/11  . Finger surgery      L pinkie finger- ORIF- /w remaining hardware - 1990's    . Umbilical hernia repair N/A 07/04/2012    Procedure: LAPAROSCOPIC UMBILICAL HERNIA;  Surgeon: Madilyn Hook, DO;  Location: [place];  Service: General;  Laterality: N/A;  . Insertion of mesh N/A 07/04/2012    Procedure: INSERTION OF MESH;  Surgeon: Madilyn Hook, DO;  Location: [place];  Service: General;  Laterality: N/A;  . Hernia repair N/A [id number]    Umbilical hernia repair  . Dialysis cath placed    . Revison of arteriovenous fistula Left 06/05/2013    Procedure: REVISON OF LEFT RADIOCEPHALIC  ARTERIOVENOUS FISTULA;  Surgeon: Conrad [place], MD;  Location: [place];  Service: Vascular;  Laterality: Left;  . Right heart catheterization N/A 05/21/2011    Procedure: RIGHT HEART CATH;  Surgeon: Birdie Riddle, MD;  Location: [facility] CATH [facility];  Service: Cardiovascular;  Laterality: N/A;  . Left and right heart catheterization with coronary angiogram N/A 08/04/2012    Procedure: LEFT AND RIGHT HEART CATHETERIZATION WITH CORONARY ANGIOGRAM;  Surgeon: Birdie Riddle, MD;  Location: [place] CATH LAB;  Service: Cardiovascular;  Laterality: N/A;   Family History  Problem Relation Age of Onset  . Hypertension Mother   . Kidney disease Mother    History  Substance Use Topics  . Smoking status: Never Smoker   .  Smokeless tobacco: Never Used  . Alcohol Use: No    Review of Systems  Constitutional: Negative for fever and chills.  Eyes: Negative for pain.  Respiratory: Negative for cough and choking.   Gastrointestinal: Positive for nausea, vomiting, abdominal pain and constipation. Negative for diarrhea.  Genitourinary: Negative for flank pain.  Musculoskeletal: Negative for back pain and neck pain.  All other systems reviewed and are negative.     Allergies  Ace inhibitors  Home Medications   Prior to Admission medications   Medication Sig Start Date End Date Taking? Authorizing Provider  amLODipine (NORVASC) 10 MG tablet Take 10 mg by mouth daily after lunch.    Yes Historical Provider, MD  aspirin 325 MG tablet Take 325 mg by mouth daily.    Yes Historical Provider, MD  b complex-vitamin c-folic acid (NEPHRO-VITE) 0.8 MG TABS tablet Take 1 tablet by mouth at bedtime.   Yes Historical Provider, MD  calcium acetate (PHOSLO) 667 MG capsule Take 667-2,001 mg by mouth 5 (five) times daily. Takes 2 capsules three times a day with meals.  Also takes 1-3 capsule with snacks, dose depends on what the snack is   Yes Historical Provider, MD  carvedilol (COREG) 25 MG tablet Take 12.5 mg by mouth 2 (two) times daily with a meal.   Yes Historical Provider, MD  isosorbide dinitrate (ISORDIL) 20 MG tablet Take 20 mg by mouth 2 (two) times daily.    Yes Historical Provider, MD  oxyCODONE-acetaminophen (PERCOCET/ROXICET) 5-325 MG per tablet Take 1 tablet by mouth at bedtime as needed for moderate pain.  11/28/14  Yes Historical Provider, MD  valsartan (DIOVAN) 320 MG tablet Take 320 mg by mouth daily.   Yes Historical Provider, MD   BP 131/73 mmHg  Pulse 74  Temp(Src) 98.2 F (36.8 C) (Oral)  Resp 16  Ht 6\' 1"  (1.854 m)  Wt 265 lb (120.203 kg)  BMI 34.97 kg/m2  SpO2 100% Physical Exam  Constitutional: He is oriented to person, place, and time. He appears well-developed and well-nourished.  HENT:   Head: Normocephalic and atraumatic.  Eyes: Conjunctivae and EOM are normal.  Neck: Normal range of motion. Neck supple.  Cardiovascular: Normal rate and regular rhythm.   Pulmonary/Chest: Effort normal. No respiratory distress.  Abdominal: Soft. He exhibits no distension. There is no tenderness. There is no rebound and no guarding.  Musculoskeletal: Normal range of motion. He exhibits no edema or tenderness.  Neurological: He is alert and oriented to person, place, and time.  Skin: Skin is warm and dry.  Nursing note and vitals reviewed.   ED Course  Procedures (including critical care time) Labs Review Labs Reviewed  BASIC METABOLIC PANEL - Abnormal; Notable for the following:    Sodium 134 (*)    Chloride 92 (*)    CO2 33 (*)  Creatinine, Ser 7.07 (*)    GFR calc non Af Amer 8 (*)    GFR calc Af Amer 9 (*)    All other components within normal limits  CBC WITH DIFFERENTIAL/PLATELET - Abnormal; Notable for the following:    RBC 3.55 (*)    Hemoglobin 11.1 (*)    HCT 33.5 (*)    Monocytes Relative 15 (*)    All other components within normal limits  LIPASE, BLOOD - Abnormal; Notable for the following:    Lipase 18 (*)    All other components within normal limits  HEPATIC FUNCTION PANEL - Abnormal; Notable for the following:    Albumin 3.1 (*)    AST 14 (*)    ALT 8 (*)    All other components within normal limits    Imaging Review Ct Abdomen Pelvis Wo Contrast  12/18/2014   CLINICAL DATA:  Acute right lower quadrant abdominal pain.  EXAM: CT ABDOMEN AND PELVIS WITHOUT CONTRAST  TECHNIQUE: Multidetector CT imaging of the abdomen and pelvis was performed following the standard protocol without IV contrast.  COMPARISON:  CT scan of May 19, 2011.  FINDINGS: Severe degenerative disc disease is noted at L4-5. Visualized lung bases appear normal.  No gallstones are noted. The spleen and pancreas appear normal. 12 mm low density is noted in left hepatic lobe which is  increased in size compared to prior exam and demonstrates average Hounsfield measurement of 38. Adrenal glands appear normal. Status post right nephrectomy. Left renal atrophy is noted. No hydronephrosis or renal obstruction is noted. Atherosclerosis of abdominal aorta is noted without aneurysm formation. The appendix appears normal. Inflammatory changes are noted adjacent to descending colon most consistent with focal diverticulitis. Urinary bladder is decompressed. Small fat containing periumbilical hernia is noted which is decreased in size compared to prior exam. No significant adenopathy is noted.  IMPRESSION: Severe focal diverticulitis of descending colon is noted. No abscess is seen at this time.  Atherosclerosis of abdominal aorta is noted without aneurysm formation.  Left renal atrophy.  Status post right nephrectomy.  12 mm low density seen in left hepatic lobe with average Hounsfield measurement of 38. This is increased in size compared to prior exam. This may simply represent atypical cyst or hemangioma, but further evaluation with MRI on nonemergent basis is recommended.   Electronically Signed   By: Marijo Conception, M.D.   On: 12/18/2014 14:36     EKG Interpretation None      MDM   Final diagnoses:  None   Patient with abdominal pain likely secondary to constipation which is likely related to dehydration. However diverticulitis is also on the list, and in a patient on dialysis it is pertinent to diagnose infections early so I will get a CT scan to evaluate for diverticulitis. I will treat him symptomatically otherwise. We'll check up potassium to ensure that he is not hypokalemic, and a CBC to make sure he is not anemic which would suggest other causes for his belly pain.  Patient in fact does have diverticulitis described as severe by the radiologist. Dema Severin count normal, no fever however his pain is persistent requiring 3 IV doses of pain medication in the emergency department. This  coupled with the fact that he has renal disease worries me about outpatient treatment so I will talk to medicine about admission for IV antibiotics and pain control.  I have personally and contemperaneously reviewed labs and imaging and used in my decision making as above.  A medical screening exam was performed and I feel the patient has had an appropriate workup for their chief complaint at this time and likelihood of emergent condition existing is low. They have been counseled on decision, discharge, follow up and which symptoms necessitate immediate return to the emergency department. They or their family verbally stated understanding and agreement with plan and discharged in stable condition.      Merrily Pew, MD 12/18/14 1745

## 2014-12-18 NOTE — ED Notes (Signed)
Attempted report 

## 2014-12-18 NOTE — ED Notes (Signed)
Pt reports to the ED for eval of abd pain, N/V, decreased PO intake, and constipation since 7/21. Denies any blood in his emesis or fevers. He is a dialysis patient. Last dialysis was today. He finished approx 20 mins early today. Has hx of nephrectomy. Pt is afebrile. Also reports lower abd pain and tenderness. Pt A&Ox4, resp e/u, and skin warm and dry.

## 2014-12-18 NOTE — Progress Notes (Signed)
Pt admitted to room 6E07 from Ed. Pt ambulated to bed.  A&OX4, VSS. Denies pain at this time. Tele # [phone].

## 2014-12-18 NOTE — H&P (Signed)
[facility] History and Physical  Handy Flavin [id number] DOB: [birth year] DOA: 12/18/2014  Referring physician: Merrily Pew, MD PCP: Maggie Font, MD   Chief Complaint: Abdominal Pain, Constipation  HPI: [name] is a 54 y.o. male with diabetes mellitus, type II, systolic CHF, end stage renal disease ( on HD M/W/F), and gout presenting with abdominal pain, constipation, bloating, and vomiting x 6 days. Pain is aching and in the left lower quadrant. He states that he has not been able to eat or have a bowel movement since the pain began on Thursday. Tried eating and drinking but throws it up immediately after. He has been able to pass lots of gas and reports pain relief after doing so. Had a similar episode two months ago but pain wasn't as severe and it resolved on its own without medical intervention. No history of chronic constipation, diarrhea, or other bowel disorders. Has not tried any medications to alleviate any of his symptoms.  He states that he normally has daily bowel movements without difficulty.  In the Ed, he had a ct scan that shows severe focal diverticulitis of descending colon, no abscess seen. WBC is normal. Vitals wnl.   Review of Systems:  Constitutional: No weight loss, night sweats, Fevers, chills, fatigue.  HEENT: No headaches, Difficulty swallowing,Tooth/dental problems,Sore throat,  No sneezing, itching, ear ache, nasal congestion, post nasal drip,  Cardio-vascular: No chest pain, Orthopnea, PND, swelling in lower extremities, anasarca, dizziness, palpitations  GI: Positive for abdominal pain, nausea, vomiting, constipation, loss of appetite. No heartburn, indigestion, diarrhea Resp: No shortness of breath with exertion or at rest. No excess mucus, no productive cough, No non-productive cough, No coughing up of blood.No change in color of mucus.No wheezing.No chest wall deformity  Skin: no rash or lesions.  GU: no dysuria, change in color of urine, no  urgency or frequency. No flank pain.  Musculoskeletal:  No joint pain or swelling. No decreased range of motion. No back pain. No edema.   Past Medical History  Diagnosis Date  . CHF (congestive heart failure)   . History of unilateral nephrectomy   . Hypertension   . Cardiomyopathy   . Gout   . Myocardial infarction ?2006  . Shortness of breath 05/19/11    "at rest, lying down, w/exertion"  . Umbilical hernia [id number]    unrepaired  . Stroke 02/2011    05/19/11 denies residual  . Dialysis patient     Mon-Wed-Fri  . Diabetes mellitus     pt. states he's borderline diabetic., no longer taking med,- off med. since 2013  . Renal cell carcinoma     dialysis- MWF  . ESRD (end stage renal disease)   . Coronary artery disease     normal coronaries by 10/10/08 cath  . History of nephrectomy 07/04/2012    History of right nephrectomy in 2002 for renal cell carcinoma   . 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 Nephrectomy  2000    right  . Av fistula placement  03/29/2011    Procedure: ARTERIOVENOUS (AV) FISTULA CREATION;  Surgeon: Hinda Lenis, MD;  Location: [place];  Service: Vascular;  Laterality: Left;  LEFT RADIOCEPHALIC , Arteriovenous [id number])  . Smashed  1990's    "left pinky; have a plate in there"  . US echocardiography  05/20/11  . Cardiac catheterization  05/21/11  . Finger surgery  L pinkie finger- ORIF- /w remaining hardware - 1990's    . Umbilical hernia repair N/A 07/04/2012    Procedure: LAPAROSCOPIC UMBILICAL HERNIA;  Surgeon: Madilyn Hook, DO;  Location: [place];  Service: General;  Laterality: N/A;  . Insertion of mesh N/A 07/04/2012    Procedure: INSERTION OF MESH;  Surgeon: Madilyn Hook, DO;  Location: [place];  Service: General;  Laterality: N/A;  . Hernia repair N/A [id number]    Umbilical hernia repair  . Dialysis cath placed    . Revison of arteriovenous fistula Left  06/05/2013    Procedure: REVISON OF LEFT RADIOCEPHALIC  ARTERIOVENOUS FISTULA;  Surgeon: Conrad [place], MD;  Location: [place];  Service: Vascular;  Laterality: Left;  . Right heart catheterization N/A 05/21/2011    Procedure: RIGHT HEART CATH;  Surgeon: Birdie Riddle, MD;  Location: [facility] CATH LAB;  Service: Cardiovascular;  Laterality: N/A;  . Left and right heart catheterization with coronary angiogram N/A 08/04/2012    Procedure: LEFT AND RIGHT HEART CATHETERIZATION WITH CORONARY ANGIOGRAM;  Surgeon: Birdie Riddle, MD;  Location: [place] CATH LAB;  Service: Cardiovascular;  Laterality: N/A;   Social History:  reports that he has never smoked. He has never used smokeless tobacco. He reports that he does not drink alcohol or use illicit drugs.  He does not work.  Is independent with ADLs.  Allergies  Allergen Reactions  . Ace Inhibitors Cough    Family History  Problem Relation Age of Onset  . Hypertension Mother   . Kidney disease Mother   No history of diverticulitis or bowel disorders in the immediate family.  Prior to Admission medications   Medication Sig Start Date End Date Taking? Authorizing Provider  amLODipine (NORVASC) 10 MG tablet Take 10 mg by mouth daily after lunch.    Yes Historical Provider, MD  aspirin 325 MG tablet Take 325 mg by mouth daily.    Yes Historical Provider, MD  b complex-vitamin c-folic acid (NEPHRO-VITE) 0.8 MG TABS tablet Take 1 tablet by mouth at bedtime.   Yes Historical Provider, MD  calcium acetate (PHOSLO) 667 MG capsule Take 667-2,001 mg by mouth 5 (five) times daily. Takes 2 capsules three times a day with meals.  Also takes 1-3 capsule with snacks, dose depends on what the snack is   Yes Historical Provider, MD  carvedilol (COREG) 25 MG tablet Take 12.5 mg by mouth 2 (two) times daily with a meal.   Yes Historical Provider, MD  isosorbide dinitrate (ISORDIL) 20 MG tablet Take 20 mg by mouth 2 (two) times daily.    Yes Historical Provider, MD    oxyCODONE-acetaminophen (PERCOCET/ROXICET) 5-325 MG per tablet Take 1 tablet by mouth at bedtime as needed for moderate pain.  11/28/14  Yes Historical Provider, MD  valsartan (DIOVAN) 320 MG tablet Take 320 mg by mouth daily.   Yes Historical Provider, MD   Physical Exam: Filed Vitals:   12/18/14 1400 12/18/14 1545 12/18/14 1600 12/18/14 1615  BP: 150/93 128/82 146/78 147/89  Pulse: 69 68 64 72  Temp:      TempSrc:      Resp:      Height:      Weight:      SpO2: 95% 94% 100% 100%    Wt Readings from Last 3 Encounters:  12/18/14 120.203 kg (265 lb)  09/03/14 122.018 kg (269 lb)  06/22/13 117.935 kg (260 lb)    General:  Wd, wn, obese, Appears calm and comfortable, pleasant Eyes:  PERRL, normal lids, irises & conjunctiva ENT: grossly normal hearing, lips & tongue Neck: no LAD, masses or thyromegaly Cardiovascular: RRR, no m/r/g. No LE edema. No JVD. Respiratory: CTA bilaterally, no w/r/r. Normal respiratory effort. Abdomen: soft, ntnd. No guarding or rebound tenderness. No masses. Skin: no rash or induration seen on limited exam Musculoskeletal: grossly normal tone BUE/BLE Psychiatric: grossly normal mood and affect, speech fluent and appropriate Neurologic: grossly non-focal.          Labs on Admission:  Basic Metabolic Panel:  Recent Labs Lab 12/18/14 1226  NA 134*  K 4.1  CL 92*  CO2 33*  GLUCOSE 78  BUN 10  CREATININE 7.07*  CALCIUM 9.2    Recent Labs Lab 12/18/14 1226  LIPASE 18*   CBC:  Recent Labs Lab 12/18/14 1226  WBC 5.3  NEUTROABS 3.5  HGB 11.1*  HCT 33.5*  MCV 94.4  PLT 181   Radiological Exams on Admission: Ct Abdomen Pelvis Wo Contrast  12/18/2014   CLINICAL DATA:  Acute right lower quadrant abdominal pain.  EXAM: CT ABDOMEN AND PELVIS WITHOUT CONTRAST  TECHNIQUE: Multidetector CT imaging of the abdomen and pelvis was performed following the standard protocol without IV contrast.  COMPARISON:  CT scan of May 19, 2011.  FINDINGS:  Severe degenerative disc disease is noted at L4-5. Visualized lung bases appear normal.  No gallstones are noted. The spleen and pancreas appear normal. 12 mm low density is noted in left hepatic lobe which is increased in size compared to prior exam and demonstrates average Hounsfield measurement of 38. Adrenal glands appear normal. Status post right nephrectomy. Left renal atrophy is noted. No hydronephrosis or renal obstruction is noted. Atherosclerosis of abdominal aorta is noted without aneurysm formation. The appendix appears normal. Inflammatory changes are noted adjacent to descending colon most consistent with focal diverticulitis. Urinary bladder is decompressed. Small fat containing periumbilical hernia is noted which is decreased in size compared to prior exam. No significant adenopathy is noted.  IMPRESSION: Severe focal diverticulitis of descending colon is noted. No abscess is seen at this time.  Atherosclerosis of abdominal aorta is noted without aneurysm formation.  Left renal atrophy.  Status post right nephrectomy.  12 mm low density seen in left hepatic lobe with average Hounsfield measurement of 38. This is increased in size compared to prior exam. This may simply represent atypical cyst or hemangioma, but further evaluation with MRI on nonemergent basis is recommended.   Electronically Signed   By: Marijo Conception, M.D.   On: 12/18/2014 14:36    EKG: Independently reviewed.   Assessment/Plan Principal Problem:   Diverticulitis large intestine w/o perforation or abscess w/o bleeding Active Problems:   Hepatic cyst   Systolic CHF, chronic   DM II (diabetes mellitus, type II), controlled   History of nephrectomy   Right sided weakness   End stage renal disease   Diverticulitis large intestine w/o perforation or abscess w/o bleeding - likely secondary to constipation, worsened by opioid treatment - CT scan indicates severe focal diverticulitis  - Flagyl and Cipro IV (pharmacy  consult for dose) - low dose dilaudid for pain - slow miralax bowel prep - clear liquid diet-renal  Systolic CHF, chronic - stable, euvolemic - coreg, isordil, valsartan  - 2D echo LVEF 25-30%, moderate hypokinesis of the entire myocardium 2014 - check daily weights, I&O's  Diabetes mellitus, type 11 - well controlled at this time; last A1c 5.8.   - not on home medications.  Right  sided weakness - history of CVA - continue aspirin  End stage renal disease - dialyzes Monday, Wed, Fri - completed dialysis today 7/27 - nephrology consulted for dialysis on 7/29  Consulting: nephrology  Code Status:  Full code DVT Prophylaxis: heparin Family Communication: no family at bedside Disposition Plan: To home in 24-48 hours  Time spent: 45 minutes  Lenda Kelp, PA-S [place], [place] [facility]

## 2014-12-18 NOTE — Progress Notes (Signed)
Paged Dr. Nehemiah Settle pt on floor, paged admission nurse. Pt on list.

## 2014-12-18 NOTE — ED Notes (Signed)
Pt belongings to room:  Shirt, shoes, hat, pants and keys.  Wallet sent home with family.  Pt has cell phone.

## 2014-12-19 DIAGNOSIS — I5022 Chronic systolic (congestive) heart failure: Secondary | ICD-10-CM

## 2014-12-19 DIAGNOSIS — K5732 Diverticulitis of large intestine without perforation or abscess without bleeding: Principal | ICD-10-CM

## 2014-12-19 DIAGNOSIS — M6289 Other specified disorders of muscle: Secondary | ICD-10-CM

## 2014-12-19 DIAGNOSIS — N186 End stage renal disease: Secondary | ICD-10-CM

## 2014-12-19 DIAGNOSIS — E119 Type 2 diabetes mellitus without complications: Secondary | ICD-10-CM

## 2014-12-19 DIAGNOSIS — Z905 Acquired absence of kidney: Secondary | ICD-10-CM

## 2014-12-19 LAB — CBC
HCT: 31.6 % — ABNORMAL LOW (ref 39.0–52.0)
Hemoglobin: 10.3 g/dL — ABNORMAL LOW (ref 13.0–17.0)
MCH: 31.2 pg (ref 26.0–34.0)
MCHC: 32.6 g/dL (ref 30.0–36.0)
MCV: 95.8 fL (ref 78.0–100.0)
Platelets: 171 10*3/uL (ref 150–400)
RBC: 3.3 MIL/uL — ABNORMAL LOW (ref 4.22–5.81)
RDW: 14 % (ref 11.5–15.5)
WBC: 5.5 10*3/uL (ref 4.0–10.5)

## 2014-12-19 LAB — COMPREHENSIVE METABOLIC PANEL
ALT: 8 U/L — ABNORMAL LOW (ref 17–63)
AST: 11 U/L — ABNORMAL LOW (ref 15–41)
Albumin: 3.1 g/dL — ABNORMAL LOW (ref 3.5–5.0)
Alkaline Phosphatase: 54 U/L (ref 38–126)
Anion gap: 12 (ref 5–15)
BUN: 15 mg/dL (ref 6–20)
CO2: 27 mmol/L (ref 22–32)
Calcium: 10 mg/dL (ref 8.9–10.3)
Chloride: 97 mmol/L — ABNORMAL LOW (ref 101–111)
Creatinine, Ser: 9.35 mg/dL — ABNORMAL HIGH (ref 0.61–1.24)
GFR calc Af Amer: 7 mL/min — ABNORMAL LOW (ref >60)
GFR calc non Af Amer: 6 mL/min — ABNORMAL LOW (ref >60)
Glucose, Bld: 75 mg/dL (ref 65–99)
Potassium: 4.5 mmol/L (ref 3.5–5.1)
Sodium: 136 mmol/L (ref 135–145)
Total Bilirubin: 0.6 mg/dL (ref 0.3–1.2)
Total Protein: 8.1 g/dL (ref 6.5–8.1)

## 2014-12-19 LAB — GLUCOSE, CAPILLARY: Glucose-Capillary: 84 mg/dL (ref 65–99)

## 2014-12-19 MED ORDER — DIPHENHYDRAMINE HCL 25 MG PO CAPS
25.0000 mg | ORAL_CAPSULE | Freq: Three times a day (TID) | ORAL | Status: DC | PRN
  Administered 2014-12-19: 25 mg via ORAL
  Filled 2014-12-19: qty 1

## 2014-12-19 NOTE — Progress Notes (Addendum)
[facility]                                                                              Patient Demographics  [name], is a 54 y.o. male, DOB - 1961-01-22, PS:[id number]  Admit date - 12/18/2014   Admitting Physician Truett Mainland, DO  Outpatient Primary MD for the patient is Maggie Font, MD  LOS - 1   Chief Complaint  Patient presents with  . Abdominal Pain  . Constipation      HPI on 12/18/2014 by Ms. [name], [name] is a 54 y.o. male with diabetes mellitus, type II, systolic CHF, end stage renal disease ( on HD M/W/F), and gout presenting with abdominal pain, constipation, bloating, and vomiting x 6 days. Pain is aching and in the left lower quadrant. He states that he has not been able to eat or have a bowel movement since the pain began on Thursday. Tried eating and drinking but throws it up immediately after. He has been able to pass lots of gas and reports pain relief after doing so. Had a similar episode two months ago but pain wasn't as severe and it resolved on its own without medical intervention. No history of chronic constipation, diarrhea, or other bowel disorders. Has not tried any medications to alleviate any of his symptoms. He states that he normally has daily bowel movements without difficulty. In the Ed, he had a ct scan that shows severe focal diverticulitis of descending colon, no abscess seen. WBC is normal. Vitals wnl.   Assessment & Plan  Diverticulitis large intestine w/o perforation or abscess w/o bleeding - likely secondary to constipation, worsened by opioid treatment - CT scan indicates severe focal diverticulitis, although patient is afebrile and has no leukocytosis  - Continue IV flagyl and cipro - Continue pain control (explained to patient that more pain medications can lead to increased constipation and that he would not be discharged with any) - Currently on clear diet, will advance to renal/carb diet as  tolerated  Systolic CHF, chronic - stable, euvolemic - Continue coreg, isordil, valsartan  - 2D echo LVEF 25-30%, moderate hypokinesis of the entire myocardium 2014 - Monitor daily weights/ intake/output  Anemia of chronic disease/ESRD - Hemoglobin currently 10.3 - No evidence for signs of bleeding, continue to monitor CBC  Diabetes mellitus, type 11 - well controlled at this time; last A1c 5.8.  - not on home medications.  Right sided weakness - history of CVA - continue aspirin  End stage renal disease - dialyzes MWF - nephrology consulted and appreciated - completed dialysis 7/27  Hepatic cyst - Noted on CT abd - Recommended nonemergent MRI for follow up  Code Status: Full  Family Communication: None at bedside  Disposition Plan: Admitted.  Likely d/c 12/20/2014  Time Spent in minutes   30 minutes  Procedures  None  Consults   Nephrology  DVT Prophylaxis  Heparin   Lab Results  Component Value Date   PLT 171 12/19/2014    Medications  Scheduled Meds: . amLODipine  10 mg Oral QPC lunch  . aspirin  325 mg Oral Daily  . calcium acetate  1,334 mg Oral  TID WC  . ciprofloxacin  400 mg Intravenous Q24H  . docusate sodium  100 mg Oral BID  . heparin  5,000 Units Subcutaneous 3 times per day  . isosorbide dinitrate  20 mg Oral BID  . metronidazole  500 mg Intravenous Q8H  . sodium chloride  3 mL Intravenous Q12H   Continuous Infusions:  PRN Meds:.acetaminophen **OR** acetaminophen, calcium acetate, diphenhydrAMINE, HYDROmorphone (DILAUDID) injection, ondansetron **OR** ondansetron (ZOFRAN) IV  Antibiotics   Anti-infectives    Start     Dose/Rate Route Frequency Ordered Stop   12/18/14 2000  ciprofloxacin (CIPRO) IVPB 400 mg     400 mg 200 mL/hr over 60 Minutes Intravenous Every 24 hours 12/18/14 1818     12/18/14 1815  metroNIDAZOLE (FLAGYL) IVPB 500 mg     500 mg 100 mL/hr over 60 Minutes Intravenous Every 8 hours 12/18/14 1813     12/18/14  1500  ciprofloxacin (CIPRO) IVPB 400 mg     400 mg 200 mL/hr over 60 Minutes Intravenous  Once 12/18/14 1456 12/18/14 1736   12/18/14 1500  metroNIDAZOLE (FLAGYL) IVPB 500 mg     500 mg 100 mL/hr over 60 Minutes Intravenous  Once 12/18/14 1456 12/18/14 1613      Subjective:   [name] seen and examined today. Patient states he has left abdominal pain.  Denies further nausea or vomiting. States he had 3 bowel movements this morning and is no longer constipated. Patient has not had a colonoscopy. He currently denies any chest pain, shortness of breath, dizziness or headache.  Objective:   Filed Vitals:   12/18/14 2031 12/19/14 0548 12/19/14 1026 12/19/14 1044  BP: 153/89 136/87 133/79   Pulse: 74 79 77   Temp: 98 F (36.7 C) 97.7 F (36.5 C) 97.6 F (36.4 C)   TempSrc: Oral Oral Oral   Resp: 17 18 17    Height:      Weight: 119.432 kg (263 lb 4.8 oz)   120.2 kg (264 lb 15.9 oz)  SpO2: 100% 100% 99%     Wt Readings from Last 3 Encounters:  12/19/14 120.2 kg (264 lb 15.9 oz)  09/03/14 122.018 kg (269 lb)  06/22/13 117.935 kg (260 lb)     Intake/Output Summary (Last 24 hours) at 12/19/14 1142 Last data filed at 12/19/14 [id number]  Gross per 24 hour  Intake    240 ml  Output      0 ml  Net    240 ml    Exam  General: Well developed, well nourished, NAD, appears stated age  HEENT: NCAT, mucous membranes moist.   Cardiovascular: S1 S2 auscultated, no rubs, murmurs or gallops. Regular rate and rhythm.  Respiratory: Clear to auscultation bilaterally with equal chest rise  Abdomen: Soft, obese, LLQ TTP, nondistended, + bowel sounds  Extremities: warm dry without cyanosis clubbing or edema.  LUE AVF +bruit  Neuro: AAOx3, nonfocal  Psych: Normal affect and demeanor with intact judgement and insight  Data Review   Micro Results No results found for this or any previous visit (from the past 240 hour(s)).  Radiology Reports Ct Abdomen Pelvis Wo Contrast  12/18/2014    CLINICAL DATA:  Acute right lower quadrant abdominal pain.  EXAM: CT ABDOMEN AND PELVIS WITHOUT CONTRAST  TECHNIQUE: Multidetector CT imaging of the abdomen and pelvis was performed following the standard protocol without IV contrast.  COMPARISON:  CT scan of May 19, 2011.  FINDINGS: Severe degenerative disc disease is noted at L4-5. Visualized lung bases  appear normal.  No gallstones are noted. The spleen and pancreas appear normal. 12 mm low density is noted in left hepatic lobe which is increased in size compared to prior exam and demonstrates average Hounsfield measurement of 38. Adrenal glands appear normal. Status post right nephrectomy. Left renal atrophy is noted. No hydronephrosis or renal obstruction is noted. Atherosclerosis of abdominal aorta is noted without aneurysm formation. The appendix appears normal. Inflammatory changes are noted adjacent to descending colon most consistent with focal diverticulitis. Urinary bladder is decompressed. Small fat containing periumbilical hernia is noted which is decreased in size compared to prior exam. No significant adenopathy is noted.  IMPRESSION: Severe focal diverticulitis of descending colon is noted. No abscess is seen at this time.  Atherosclerosis of abdominal aorta is noted without aneurysm formation.  Left renal atrophy.  Status post right nephrectomy.  12 mm low density seen in left hepatic lobe with average Hounsfield measurement of 38. This is increased in size compared to prior exam. This may simply represent atypical cyst or hemangioma, but further evaluation with MRI on nonemergent basis is recommended.   Electronically Signed   By: Marijo Conception, M.D.   On: 12/18/2014 14:36    CBC  Recent Labs Lab 12/18/14 1226 12/19/14 0536  WBC 5.3 5.5  HGB 11.1* 10.3*  HCT 33.5* 31.6*  PLT 181 171  MCV 94.4 95.8  MCH 31.3 31.2  MCHC 33.1 32.6  RDW 13.8 14.0  LYMPHSABS 0.9  --   MONOABS 0.8  --   EOSABS 0.1  --   BASOSABS 0.0  --      Chemistries   Recent Labs Lab 12/18/14 1226 12/19/14 0536  NA 134* 136  K 4.1 4.5  CL 92* 97*  CO2 33* 27  GLUCOSE 78 75  BUN 10 15  CREATININE 7.07* 9.35*  CALCIUM 9.2 10.0  AST 14* 11*  ALT 8* 8*  ALKPHOS 51 54  BILITOT 0.8 0.6   ------------------------------------------------------------------------------------------------------------------ estimated creatinine clearance is 12.7 mL/min (by C-G formula based on Cr of 9.35). ------------------------------------------------------------------------------------------------------------------ No results for input(s): HGBA1C in the last 72 hours. ------------------------------------------------------------------------------------------------------------------ No results for input(s): CHOL, HDL, LDLCALC, TRIG, CHOLHDL, LDLDIRECT in the last 72 hours. ------------------------------------------------------------------------------------------------------------------ No results for input(s): TSH, T4TOTAL, T3FREE, THYROIDAB in the last 72 hours.  Invalid input(s): FREET3 ------------------------------------------------------------------------------------------------------------------ No results for input(s): VITAMINB12, FOLATE, FERRITIN, TIBC, IRON, RETICCTPCT in the last 72 hours.  Coagulation profile No results for input(s): INR, PROTIME in the last 168 hours.  No results for input(s): DDIMER in the last 72 hours.  Cardiac Enzymes No results for input(s): CKMB, TROPONINI, MYOGLOBIN in the last 168 hours.  Invalid input(s): CK ------------------------------------------------------------------------------------------------------------------ Invalid input(s): POCBNP    Zaylon Bossier D.O. on 12/19/2014 at 11:42 AM  Between 7am to 7pm - Pager - [phone]  After 7pm go to [email or web address] - password [email or web address]  And look for the night coverage person covering for me after hours  [facility] Office   [phone]

## 2014-12-19 NOTE — Progress Notes (Signed)
Utilization review completed. Ahmira Boisselle, RN, BSN. 

## 2014-12-19 NOTE — Consult Note (Signed)
[facility] Renal Consultation Note    Indication for Consultation:  Management of ESRD/hemodialysis; anemia, hypertension/volume and secondary hyperparathyroidism PCP:  HPI: [name] is a very pleasant  54 y.o. male with ESRD who has hemodialysis at [facility].  PMH significant for renal cell carcinoma with R nephrectomy, CVA, HTN, MI, Cardiomyopathy with EF 25-30% in [id number], gout, umbilical hernia, DMT2 no longer on medications.  [name] reports a 6 day history of nausea, vomiting, constipation and pain in LLQ of abdomen. Patient says he initially thought he had GI virus but symptoms did not improve. Presented to [facility] ED for evaluation of abdominal pain. CT of abdomen showed Severe focal diverticulitis of descending colon without abscess. 12 mm low density seen in left hepatic lobe with average Hounsfield measurement of 38. Possible atypical cyst or hemangioma-recommended non urgent work up at later date. Patient has been admitted for antibiotic management.   Patient has been attending hemodialysis sessions in spite of illness.  [name] attends HD regularly, does not miss treatments or leave early. Last labs from hemodialysis center are as follows: Phos: 7.1 Ca 8.7 C Ca 8.8 (11/13/14) Hgb 11.2 (12/11/14).     Past Medical History  Diagnosis Date  . CHF (congestive heart failure)   . History of unilateral nephrectomy   . Hypertension   . Cardiomyopathy   . Gout   . Myocardial infarction ?2006  . Shortness of breath 05/19/11    "at rest, lying down, w/exertion"  . Umbilical hernia [id number]    unrepaired  . Stroke 02/2011    05/19/11 denies residual  . Dialysis patient     Mon-Wed-Fri  . Diabetes mellitus     pt. states he's borderline diabetic., no longer taking med,- off med. since 2013  . Renal cell carcinoma     dialysis- MWF  . ESRD (end stage renal disease)   . Coronary artery disease     normal coronaries by 10/10/08 cath  . History of  nephrectomy 07/04/2012    History of right nephrectomy in 2002 for renal cell carcinoma   . 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 Nephrectomy  2000    right  . Av fistula placement  03/29/2011    Procedure: ARTERIOVENOUS (AV) FISTULA CREATION;  Surgeon: Hinda Lenis, MD;  Location: [place];  Service: Vascular;  Laterality: Left;  LEFT RADIOCEPHALIC , Arteriovenous [id number])  . Smashed  1990's    "left pinky; have a plate in there"  . US echocardiography  05/20/11  . Cardiac catheterization  05/21/11  . Finger surgery      L pinkie finger- ORIF- /w remaining hardware - 1990's    . Umbilical hernia repair N/A 07/04/2012    Procedure: LAPAROSCOPIC UMBILICAL HERNIA;  Surgeon: Madilyn Hook, DO;  Location: [place];  Service: General;  Laterality: N/A;  . Insertion of mesh N/A 07/04/2012    Procedure: INSERTION OF MESH;  Surgeon: Madilyn Hook, DO;  Location: [place];  Service: General;  Laterality: N/A;  . Hernia repair N/A [id number]    Umbilical hernia repair  . Dialysis cath placed    . Revison of arteriovenous fistula Left 06/05/2013    Procedure: REVISON OF LEFT RADIOCEPHALIC  ARTERIOVENOUS FISTULA;  Surgeon: Conrad Cameron, MD;  Location: [place];  Service: Vascular;  Laterality: Left;  . Right heart catheterization N/A 05/21/2011  Procedure: RIGHT HEART CATH;  Surgeon: Birdie Riddle, MD;  Location: [facility] CATH LAB;  Service: Cardiovascular;  Laterality: N/A;  . Left and right heart catheterization with coronary angiogram N/A 08/04/2012    Procedure: LEFT AND RIGHT HEART CATHETERIZATION WITH CORONARY ANGIOGRAM;  Surgeon: Birdie Riddle, MD;  Location: [place] CATH LAB;  Service: Cardiovascular;  Laterality: N/A;   Family History  Problem Relation Age of Onset  . Hypertension Mother   . Kidney disease Mother    Social History:  reports that he has never smoked. He has never used smokeless tobacco. He reports that he  does not drink alcohol or use illicit drugs. Allergies  Allergen Reactions  . Ace Inhibitors Cough   Prior to Admission medications   Medication Sig Start Date End Date Taking? Authorizing Provider  amLODipine (NORVASC) 10 MG tablet Take 10 mg by mouth daily after lunch.    Yes Historical Provider, MD  aspirin 325 MG tablet Take 325 mg by mouth daily.    Yes Historical Provider, MD  b complex-vitamin c-folic acid (NEPHRO-VITE) 0.8 MG TABS tablet Take 1 tablet by mouth at bedtime.   Yes Historical Provider, MD  calcium acetate (PHOSLO) 667 MG capsule Take 667-2,001 mg by mouth 5 (five) times daily. Takes 2 capsules three times a day with meals.  Also takes 1-3 capsule with snacks, dose depends on what the snack is   Yes Historical Provider, MD  carvedilol (COREG) 25 MG tablet Take 12.5 mg by mouth 2 (two) times daily with a meal.   Yes Historical Provider, MD  isosorbide dinitrate (ISORDIL) 20 MG tablet Take 20 mg by mouth 2 (two) times daily.    Yes Historical Provider, MD  oxyCODONE-acetaminophen (PERCOCET/ROXICET) 5-325 MG per tablet Take 1 tablet by mouth at bedtime as needed for moderate pain.  11/28/14  Yes Historical Provider, MD  valsartan (DIOVAN) 320 MG tablet Take 320 mg by mouth daily.   Yes Historical Provider, MD   Current Facility-Administered Medications  Medication Dose Route Frequency Provider Last Rate Last Dose  . acetaminophen (TYLENOL) tablet 650 mg  650 mg Oral Q6H PRN Melton Alar, PA-C       Or  . acetaminophen (TYLENOL) suppository 650 mg  650 mg Rectal Q6H PRN Melton Alar, PA-C      . amLODipine (NORVASC) tablet 10 mg  10 mg Oral QPC lunch Melton Alar, PA-C      . aspirin tablet 325 mg  325 mg Oral Daily Melton Alar, PA-C   325 mg at 12/19/14 0908  . calcium acetate (PHOSLO) capsule 1,334 mg  1,334 mg Oral TID WC Tanna Savoy Stinson, DO      . calcium acetate (PHOSLO) capsule [id number] mg  667-2,001 mg Oral BID PRN Tanna Savoy Stinson, DO      . carvedilol  (COREG) tablet 12.5 mg  12.5 mg Oral BID WC Melton Alar, PA-C   12.5 mg at 12/19/14 0909  . ciprofloxacin (CIPRO) IVPB 400 mg  400 mg Intravenous Q24H Lauren D Bajbus, RPH   400 mg at 12/18/14 2209  . diphenhydrAMINE (BENADRYL) capsule 25 mg  25 mg Oral Q8H PRN Gardiner Barefoot, NP   25 mg at 12/19/14 0222  . docusate sodium (COLACE) capsule 100 mg  100 mg Oral BID Melton Alar, PA-C   100 mg at 12/19/14 [id number]  . heparin injection 5,000 Units  5,000 Units Subcutaneous 3 times per day Melton Alar, PA-C  5,000 Units at 12/19/14 0539  . HYDROmorphone (DILAUDID) injection 0.5 mg  0.5 mg Intravenous Q4H PRN Melton Alar, PA-C   0.5 mg at 12/19/14 0537  . irbesartan (AVAPRO) tablet 300 mg  300 mg Oral Daily Melton Alar, PA-C   300 mg at 12/18/14 2208  . isosorbide dinitrate (ISORDIL) tablet 20 mg  20 mg Oral BID Melton Alar, PA-C   20 mg at 12/19/14 [id number]  . metroNIDAZOLE (FLAGYL) IVPB 500 mg  500 mg Intravenous Q8H Marianne L York, PA-C   500 mg at 12/19/14 0222  . ondansetron (ZOFRAN) tablet 4 mg  4 mg Oral Q6H PRN Melton Alar, PA-C       Or  . ondansetron [facility]) injection 4 mg  4 mg Intravenous Q6H PRN Melton Alar, PA-C      . sodium chloride 0.9 % injection 3 mL  3 mL Intravenous Q12H Melton Alar, PA-C   3 mL at 12/18/14 2210   Labs: Basic Metabolic Panel:  Recent Labs Lab 12/18/14 1226 12/19/14 0536  NA 134* 136  K 4.1 4.5  CL 92* 97*  CO2 33* 27  GLUCOSE 78 75  BUN 10 15  CREATININE 7.07* 9.35*  CALCIUM 9.2 10.0   Liver Function Tests:  Recent Labs Lab 12/18/14 1226 12/19/14 0536  AST 14* 11*  ALT 8* 8*  ALKPHOS 51 54  BILITOT 0.8 0.6  PROT 7.6 8.1  ALBUMIN 3.1* 3.1*    Recent Labs Lab 12/18/14 1226  LIPASE 18*   No results for input(s): AMMONIA in the last 168 hours. CBC:  Recent Labs Lab 12/18/14 1226 12/19/14 0536  WBC 5.3 5.5  NEUTROABS 3.5  --   HGB 11.1* 10.3*  HCT 33.5* 31.6*  MCV 94.4 95.8  PLT 181 171    Cardiac Enzymes: No results for input(s): CKTOTAL, CKMB, CKMBINDEX, TROPONINI in the last 168 hours. CBG:  Recent Labs Lab 12/18/14 2030  GLUCAP 89   Iron Studies: No results for input(s): IRON, TIBC, TRANSFERRIN, FERRITIN in the last 72 hours. Studies/Results: Ct Abdomen Pelvis Wo Contrast  12/18/2014   CLINICAL DATA:  Acute right lower quadrant abdominal pain.  EXAM: CT ABDOMEN AND PELVIS WITHOUT CONTRAST  TECHNIQUE: Multidetector CT imaging of the abdomen and pelvis was performed following the standard protocol without IV contrast.  COMPARISON:  CT scan of May 19, 2011.  FINDINGS: Severe degenerative disc disease is noted at L4-5. Visualized lung bases appear normal.  No gallstones are noted. The spleen and pancreas appear normal. 12 mm low density is noted in left hepatic lobe which is increased in size compared to prior exam and demonstrates average Hounsfield measurement of 38. Adrenal glands appear normal. Status post right nephrectomy. Left renal atrophy is noted. No hydronephrosis or renal obstruction is noted. Atherosclerosis of abdominal aorta is noted without aneurysm formation. The appendix appears normal. Inflammatory changes are noted adjacent to descending colon most consistent with focal diverticulitis. Urinary bladder is decompressed. Small fat containing periumbilical hernia is noted which is decreased in size compared to prior exam. No significant adenopathy is noted.  IMPRESSION: Severe focal diverticulitis of descending colon is noted. No abscess is seen at this time.  Atherosclerosis of abdominal aorta is noted without aneurysm formation.  Left renal atrophy.  Status post right nephrectomy.  12 mm low density seen in left hepatic lobe with average Hounsfield measurement of 38. This is increased in size compared to prior exam. This may simply represent atypical cyst  or hemangioma, but further evaluation with MRI on nonemergent basis is recommended.   Electronically Signed    By: Marijo Conception, M.D.   On: 12/18/2014 14:36    ROS: As per HPI otherwise negative.  Physical Exam: Filed Vitals:   12/18/14 1702 12/18/14 2031 12/19/14 0548 12/19/14 1026  BP: 131/73 153/89 136/87 133/79  Pulse: 74 74 79 77  Temp: 98.2 F (36.8 C) 98 F (36.7 C) 97.7 F (36.5 C) 97.6 F (36.4 C)  TempSrc: Oral Oral Oral Oral  Resp: 16 17 18 17   Height:      Weight:  119.432 kg (263 lb 4.8 oz)    SpO2: 100% 100% 100% 99%     General: Well developed, well nourished, in no acute distress. Head: Normocephalic, atraumatic, sclera non-icteric, mucus membranes are moist Neck: Supple. JVD not elevated. Lungs: Clear bilaterally to auscultation without wheezes, rales, or rhonchi. Breathing is unlabored. Heart: RRR with S1 S2. No murmurs, rubs, or gallops appreciated. Abdomen: Exquisite tenderness RLQ of abdomen with radiation down into R. Pelvic area. No rebound/ definitely guarding. No obvious abdominal masses. M-S:  Strength and tone appear normal for age. Lower extremities:without edema or ischemic changes, no open wounds  Neuro: Alert and oriented X 3. Moves all extremities spontaneously. Psych:  Responds to questions appropriately with a normal affect. Dialysis Access: LFA AVF  Dialysis Orders: Center: [facility]  on MWF . EDW 120 kg HD [place] 2.0 K 2.25 Ca  Time  Hours  Heparin 12000 units per Tx. Access LFA AVF BFR 450 DFR 800    Hectoral 4 mcg IV MWF (Last dose 12/18/14)  Assessment/Plan: 1.  Diverticulitis large intestine:  Per primary. On Flagyl and Cipro per pharmacy  2.  ESRD -  [facility]-Heparin 14000 units. Will use standard heparin in HD tomorrow.  3.  Hypertension/volume  - amlodipine 10 mg PO daily and isosorbide dinitrate 20 mg PO BID per home med list. Discrepancy between HD center Med list and what is ordered for patient. Pt does not know what meds he is taking. Asked to bring home meds to hospital to verify. Will DC Coreg and Avapro until meds verified.  4.  BP has been  higher post HD in centers and patient has been below EDW 120 kg last two treatments. Current wt 120.2.  Will attempt 2-3 liters as tolerated. Reset EDW.  5.  Anemia  - Hgb 10.3. No OP ESAs. Will follow CBCs.  6.  Metabolic bone disease -  Ca 10 C Ca 10.72  Cont. OP binders when eating again.  7.  Nutrition - Clear liquids per primary. Will advance as tolerated to renal diet.    Rita H. Owens Shark, NP-C 12/19/2014, 10:37 AM  [facility] Beeper [phone]  Pt seen, examined and agree w A/P as above.  Kelly Splinter MD pager [phone]    cell [phone] 12/19/2014, 1:58 PM

## 2014-12-20 DIAGNOSIS — K7689 Other specified diseases of liver: Secondary | ICD-10-CM

## 2014-12-20 LAB — RENAL FUNCTION PANEL
Albumin: 3 g/dL — ABNORMAL LOW (ref 3.5–5.0)
Anion gap: 11 (ref 5–15)
BUN: 21 mg/dL — ABNORMAL HIGH (ref 6–20)
CO2: 28 mmol/L (ref 22–32)
Calcium: 9.6 mg/dL (ref 8.9–10.3)
Chloride: 92 mmol/L — ABNORMAL LOW (ref 101–111)
Creatinine, Ser: 11.99 mg/dL — ABNORMAL HIGH (ref 0.61–1.24)
GFR calc Af Amer: 5 mL/min — ABNORMAL LOW (ref >60)
GFR calc non Af Amer: 4 mL/min — ABNORMAL LOW (ref >60)
Glucose, Bld: 109 mg/dL — ABNORMAL HIGH (ref 65–99)
Phosphorus: 6.3 mg/dL — ABNORMAL HIGH (ref 2.5–4.6)
Potassium: 4.8 mmol/L (ref 3.5–5.1)
Sodium: 131 mmol/L — ABNORMAL LOW (ref 135–145)

## 2014-12-20 LAB — CBC
HCT: 29.6 % — ABNORMAL LOW (ref 39.0–52.0)
Hemoglobin: 9.8 g/dL — ABNORMAL LOW (ref 13.0–17.0)
MCH: 31.3 pg (ref 26.0–34.0)
MCHC: 33.1 g/dL (ref 30.0–36.0)
MCV: 94.6 fL (ref 78.0–100.0)
Platelets: 186 10*3/uL (ref 150–400)
RBC: 3.13 MIL/uL — ABNORMAL LOW (ref 4.22–5.81)
RDW: 14 % (ref 11.5–15.5)
WBC: 7.1 10*3/uL (ref 4.0–10.5)

## 2014-12-20 LAB — HEPATITIS B SURFACE ANTIGEN: Hepatitis B Surface Ag: NEGATIVE

## 2014-12-20 MED ORDER — HEPARIN SODIUM (PORCINE) 1000 UNIT/ML DIALYSIS
100.0000 [IU]/kg | INTRAMUSCULAR | Status: DC | PRN
  Filled 2014-12-20: qty 12

## 2014-12-20 MED ORDER — ALTEPLASE 2 MG IJ SOLR
2.0000 mg | Freq: Once | INTRAMUSCULAR | Status: DC | PRN
  Filled 2014-12-20: qty 2

## 2014-12-20 MED ORDER — CIPROFLOXACIN HCL 500 MG PO TABS: 500.0000 mg | ORAL_TABLET | Freq: Every day | ORAL | Status: DC

## 2014-12-20 MED ORDER — NEPRO/CARBSTEADY PO LIQD
237.0000 mL | ORAL | Status: DC | PRN
  Filled 2014-12-20: qty 237

## 2014-12-20 MED ORDER — LIDOCAINE HCL (PF) 1 % IJ SOLN: 5.0000 mL | INTRAMUSCULAR | Status: DC | PRN

## 2014-12-20 MED ORDER — METRONIDAZOLE 500 MG PO TABS: 500.0000 mg | ORAL_TABLET | Freq: Three times a day (TID) | ORAL | Status: DC

## 2014-12-20 MED ORDER — SODIUM CHLORIDE 0.9 % IV SOLN: 100.0000 mL | INTRAVENOUS | Status: DC | PRN

## 2014-12-20 MED ORDER — PENTAFLUOROPROP-TETRAFLUOROETH EX AERO: 1.0000 "application " | INHALATION_SPRAY | CUTANEOUS | Status: DC | PRN

## 2014-12-20 MED ORDER — OXYCODONE-ACETAMINOPHEN 5-325 MG PO TABS: 1.0000 | ORAL_TABLET | Freq: Every evening | ORAL | Status: DC | PRN

## 2014-12-20 MED ORDER — LIDOCAINE-PRILOCAINE 2.5-2.5 % EX CREA: 1.0000 "application " | TOPICAL_CREAM | CUTANEOUS | Status: DC | PRN

## 2014-12-20 MED ORDER — HEPARIN SODIUM (PORCINE) 1000 UNIT/ML DIALYSIS: 1000.0000 [IU] | INTRAMUSCULAR | Status: DC | PRN

## 2014-12-20 NOTE — Progress Notes (Signed)
[facility]                                                                              Patient Demographics  [name], is a 54 y.o. male, DOB - 10/25/1960, [id number]  Admit date - 12/18/2014   Admitting Physician Truett Mainland, DO  Outpatient Primary MD for the patient is Maggie Font, MD  LOS - 2   Chief Complaint  Patient presents with  . Abdominal Pain  . Constipation      HPI on 12/18/2014 by Ms. [name], [place] [name] is a 54 y.o. male with diabetes mellitus, type II, systolic CHF, end stage renal disease ( on HD M/W/F), and gout presenting with abdominal pain, constipation, bloating, and vomiting x 6 days. Pain is aching and in the left lower quadrant. He states that he has not been able to eat or have a bowel movement since the pain began on Thursday. Tried eating and drinking but throws it up immediately after. He has been able to pass lots of gas and reports pain relief after doing so. Had a similar episode two months ago but pain wasn't as severe and it resolved on its own without medical intervention. No history of chronic constipation, diarrhea, or other bowel disorders. Has not tried any medications to alleviate any of his symptoms. He states that he normally has daily bowel movements without difficulty. In the Ed, he had a ct scan that shows severe focal diverticulitis of descending colon, no abscess seen. WBC is normal. Vitals wnl.   Assessment & Plan  Diverticulitis large intestine w/o perforation or abscess w/o bleeding - likely secondary to constipation, worsened by opioid treatment - CT scan indicates severe focal diverticulitis, although patient is afebrile and has no leukocytosis  - Continue IV flagyl and cipro - Continue pain control (explained to patient that more pain medications can lead to increased constipation and that he would not be discharged with any) - Transitioned to renal/carb modified, however, began to have nausea and  vomiting.   - Continue antiemetics  Systolic CHF, chronic - stable, euvolemic - Continue coreg, isordil, valsartan  - 2D echo LVEF 25-30%, moderate hypokinesis of the entire myocardium 2014 - Monitor daily weights/ intake/output  Anemia of chronic disease/ESRD - Hemoglobin currently 9.8 - No evidence for signs of bleeding, continue to monitor CBC  Diabetes mellitus, type 2 - well controlled at this time; last A1c 5.8.  - not on home medications.  Right sided weakness - history of CVA - continue aspirin  End stage renal disease - dialyzes MWF - nephrology consulted and appreciated  Hepatic cyst - Noted on CT abd - Recommended nonemergent MRI for follow up  Code Status: Full  Family Communication: None at bedside  Disposition Plan: Admitted.  If nausea and pain improve, will likely d/c 12/21/2014  Time Spent in minutes   30 minutes  Procedures  None  Consults   Nephrology  DVT Prophylaxis  Heparin   Lab Results  Component Value Date   PLT 186 12/20/2014    Medications  Scheduled Meds: . amLODipine  10 mg Oral QPC lunch  . aspirin  325 mg Oral Daily  .  calcium acetate  1,334 mg Oral TID WC  . ciprofloxacin  400 mg Intravenous Q24H  . docusate sodium  100 mg Oral BID  . heparin  5,000 Units Subcutaneous 3 times per day  . isosorbide dinitrate  20 mg Oral BID  . metronidazole  500 mg Intravenous Q8H  . sodium chloride  3 mL Intravenous Q12H   Continuous Infusions:  PRN Meds:.acetaminophen **OR** acetaminophen, calcium acetate, diphenhydrAMINE, HYDROmorphone (DILAUDID) injection, ondansetron **OR** ondansetron (ZOFRAN) IV  Antibiotics   Anti-infectives    Start     Dose/Rate Route Frequency Ordered Stop   12/20/14 0000  metroNIDAZOLE (FLAGYL) 500 MG tablet     500 mg Oral 3 times daily 12/20/14 0946     12/20/14 0000  ciprofloxacin (CIPRO) 500 MG tablet     500 mg Oral Daily with breakfast 12/20/14 0946     12/18/14 2000  ciprofloxacin (CIPRO)  IVPB 400 mg     400 mg 200 mL/hr over 60 Minutes Intravenous Every 24 hours 12/18/14 1818     12/18/14 1815  metroNIDAZOLE (FLAGYL) IVPB 500 mg     500 mg 100 mL/hr over 60 Minutes Intravenous Every 8 hours 12/18/14 1813     12/18/14 1500  ciprofloxacin (CIPRO) IVPB 400 mg     400 mg 200 mL/hr over 60 Minutes Intravenous  Once 12/18/14 1456 12/18/14 1736   12/18/14 1500  metroNIDAZOLE (FLAGYL) IVPB 500 mg     500 mg 100 mL/hr over 60 Minutes Intravenous  Once 12/18/14 1456 12/18/14 1613      Subjective:   [name] seen and examined today. Continues to have pain and nausea.  Denies vomiting, headache, chest pain, shortness of breath.   Objective:   Filed Vitals:   12/20/14 0900 12/20/14 0930 12/20/14 1000 12/20/14 1110  BP: 136/80 138/83 131/79 140/80  Pulse: 83 88 86 88  Temp:    98.9 F (37.2 C)  TempSrc:    Oral  Resp: 14 18 22 20   Height:      Weight:    118.9 kg (262 lb 2 oz)  SpO2:    100%    Wt Readings from Last 3 Encounters:  12/20/14 118.9 kg (262 lb 2 oz)  09/03/14 122.018 kg (269 lb)  06/22/13 117.935 kg (260 lb)     Intake/Output Summary (Last 24 hours) at 12/20/14 1407 Last data filed at 12/20/14 1110  Gross per 24 hour  Intake   1280 ml  Output   2000 ml  Net   -720 ml    Exam  General: Well developed, well nourished, no distress  HEENT: NCAT, mucous membranes moist.   Cardiovascular: S1 S2 auscultated, RRR, no murmurs  Respiratory: Clear to auscultation  Abdomen: Soft, obese, LLQ TTP, nondistended, + bowel sounds  Extremities: warm dry without cyanosis clubbing or edema.  LUE AVF +bruit  Neuro: AAOx3, nonfocal  Psych: Normal affect and demeanor   Data Review   Micro Results No results found for this or any previous visit (from the past 240 hour(s)).  Radiology Reports Ct Abdomen Pelvis Wo Contrast  12/18/2014   CLINICAL DATA:  Acute right lower quadrant abdominal pain.  EXAM: CT ABDOMEN AND PELVIS WITHOUT CONTRAST  TECHNIQUE:  Multidetector CT imaging of the abdomen and pelvis was performed following the standard protocol without IV contrast.  COMPARISON:  CT scan of May 19, 2011.  FINDINGS: Severe degenerative disc disease is noted at L4-5. Visualized lung bases appear normal.  No gallstones are noted.  The spleen and pancreas appear normal. 12 mm low density is noted in left hepatic lobe which is increased in size compared to prior exam and demonstrates average Hounsfield measurement of 38. Adrenal glands appear normal. Status post right nephrectomy. Left renal atrophy is noted. No hydronephrosis or renal obstruction is noted. Atherosclerosis of abdominal aorta is noted without aneurysm formation. The appendix appears normal. Inflammatory changes are noted adjacent to descending colon most consistent with focal diverticulitis. Urinary bladder is decompressed. Small fat containing periumbilical hernia is noted which is decreased in size compared to prior exam. No significant adenopathy is noted.  IMPRESSION: Severe focal diverticulitis of descending colon is noted. No abscess is seen at this time.  Atherosclerosis of abdominal aorta is noted without aneurysm formation.  Left renal atrophy.  Status post right nephrectomy.  12 mm low density seen in left hepatic lobe with average Hounsfield measurement of 38. This is increased in size compared to prior exam. This may simply represent atypical cyst or hemangioma, but further evaluation with MRI on nonemergent basis is recommended.   Electronically Signed   By: Marijo Conception, M.D.   On: 12/18/2014 14:36    CBC  Recent Labs Lab 12/18/14 1226 12/19/14 0536 12/20/14 0723  WBC 5.3 5.5 7.1  HGB 11.1* 10.3* 9.8*  HCT 33.5* 31.6* 29.6*  PLT 181 171 186  MCV 94.4 95.8 94.6  MCH 31.3 31.2 31.3  MCHC 33.1 32.6 33.1  RDW 13.8 14.0 14.0  LYMPHSABS 0.9  --   --   MONOABS 0.8  --   --   EOSABS 0.1  --   --   BASOSABS 0.0  --   --     Chemistries   Recent Labs Lab  12/18/14 1226 12/19/14 0536 12/20/14 0724  NA 134* 136 131*  K 4.1 4.5 4.8  CL 92* 97* 92*  CO2 33* 27 28  GLUCOSE 78 75 109*  BUN 10 15 21*  CREATININE 7.07* 9.35* 11.99*  CALCIUM 9.2 10.0 9.6  AST 14* 11*  --   ALT 8* 8*  --   ALKPHOS 51 54  --   BILITOT 0.8 0.6  --    ------------------------------------------------------------------------------------------------------------------ estimated creatinine clearance is 9.8 mL/min (by C-G formula based on Cr of 11.99). ------------------------------------------------------------------------------------------------------------------ No results for input(s): HGBA1C in the last 72 hours. ------------------------------------------------------------------------------------------------------------------ No results for input(s): CHOL, HDL, LDLCALC, TRIG, CHOLHDL, LDLDIRECT in the last 72 hours. ------------------------------------------------------------------------------------------------------------------ No results for input(s): TSH, T4TOTAL, T3FREE, THYROIDAB in the last 72 hours.  Invalid input(s): FREET3 ------------------------------------------------------------------------------------------------------------------ No results for input(s): VITAMINB12, FOLATE, FERRITIN, TIBC, IRON, RETICCTPCT in the last 72 hours.  Coagulation profile No results for input(s): INR, PROTIME in the last 168 hours.  No results for input(s): DDIMER in the last 72 hours.  Cardiac Enzymes No results for input(s): CKMB, TROPONINI, MYOGLOBIN in the last 168 hours.  Invalid input(s): CK ------------------------------------------------------------------------------------------------------------------ Invalid input(s): POCBNP    Johneisha Broaden D.O. on 12/20/2014 at 2:07 PM  Between 7am to 7pm - Pager - [phone]  After 7pm go to [email or web address] - password [email or web address]  And look for the night coverage person covering for me after hours  [facility] Office  [phone]

## 2014-12-20 NOTE — Discharge Summary (Signed)
Physician Discharge Summary  Kiser Kuruc [id number] DOB: 1960/12/07 DOA: 12/18/2014  PCP: Maggie Font, MD  Admit date: 12/18/2014 Discharge date: 12/21/2014  Time spent: 45 minutes  Recommendations for Outpatient Follow-up:  Patient will be discharged to home.  Patient will need to follow up with primary care provider within one week of discharge.  Recommend patient have outpatient screening colonoscopy once diverticulitis has resolved.  Continue dialysis as scheduled.  Patient will need follow up MRI for hepatic cyst. Patient should continue medications as prescribed.  Patient should follow a renal/carb modified diet.   Discharge Diagnoses:  Diverticulitis large intestine w/o perforation or abscess w/o bleeding Systolic CHF, chronic Anemia of chronic disease/ESRD Diabetes mellitus, type 2 Right sided weakness End stage renal disease Hepatic cyst  Discharge Condition: Stable  Diet recommendation: renal/carb modified  Filed Weights   12/20/14 0500 12/20/14 0700 12/20/14 1110  Weight: 120.7 kg (266 lb 1.5 oz) 121.6 kg (268 lb 1.3 oz) 118.9 kg (262 lb 2 oz)    History of present illness:  on 12/18/2014 by Ms. [name], [place] [name] is a 54 y.o. male with diabetes mellitus, type II, systolic CHF, end stage renal disease ( on HD M/W/F), and gout presenting with abdominal pain, constipation, bloating, and vomiting x 6 days. Pain is aching and in the left lower quadrant. He states that he has not been able to eat or have a bowel movement since the pain began on Thursday. Tried eating and drinking but throws it up immediately after. He has been able to pass lots of gas and reports pain relief after doing so. Had a similar episode two months ago but pain wasn't as severe and it resolved on its own without medical intervention. No history of chronic constipation, diarrhea, or other bowel disorders. Has not tried any medications to alleviate any of his symptoms. He states that he  normally has daily bowel movements without difficulty. In the Ed, he had a ct scan that shows severe focal diverticulitis of descending colon, no abscess seen. WBC is normal. Vitals wnl.   Hospital Course:  Diverticulitis large intestine w/o perforation or abscess w/o bleeding - likely secondary to constipation, worsened by opioid treatment - CT scan indicates severe focal diverticulitis, although patient is afebrile and has no leukocytosis  - Initially placed on IV flagyl and cipro- will discharge with PO - Continue pain control (explained to patient that more pain medications can lead to increased constipation and that he would not be discharged with any) - Transitioned to soft diet, tolerated it well  Systolic CHF, chronic - stable, euvolemic - Continue coreg, isordil, valsartan  - 2D echo LVEF 25-30%, moderate hypokinesis of the entire myocardium 2014 - Monitor daily weights/ intake/output  Anemia of chronic disease/ESRD - Hemoglobin currently 9.8 - No evidence for signs of bleeding, continue to monitor CBC  Diabetes mellitus, type 2 - well controlled at this time; last A1c 5.8.  - not on home medications.  Right sided weakness - history of CVA - continue aspirin  End stage renal disease - dialyzes MWF - nephrology consulted and appreciated  Hepatic cyst - Noted on CT abd - Recommended nonemergent MRI for follow up  Procedures  None  Consults  Nephrology  Discharge Exam: Filed Vitals:   12/21/14 0804  BP: 140/79  Pulse: 84  Temp: 98 F (36.7 C)  Resp: 18   Exam  General: Well developed, well nourished, No distress  HEENT: NCAT, mucous membranes moist.   Cardiovascular: S1  S2 auscultated, RRR, no murmurs  Respiratory: Clear to auscultation   Abdomen: Soft, obese, LLQ TTP, nondistended, + bowel sounds  Extremities: warm dry without cyanosis clubbing or edema. LUE AVF +bruit  Discharge Instructions      Discharge Instructions     Discharge instructions    Complete by:  As directed   Patient will be discharged to home.  Patient will need to follow up with primary care provider within one week of discharge.  Recommend patient have outpatient screening colonoscopy once diverticulitis has resolved.  Continue dialysis as scheduled.  Patient will need follow up MRI for hepatic cyst. Patient should continue medications as prescribed.  Patient should follow a renal/carb modified diet.            Medication List    STOP taking these medications        valsartan 320 MG tablet  Commonly known as:  DIOVAN      TAKE these medications        amLODipine 10 MG tablet  Commonly known as:  NORVASC  Take 10 mg by mouth daily after lunch.     aspirin 325 MG tablet  Take 325 mg by mouth daily.     b complex-vitamin c-folic acid 0.8 MG Tabs tablet  Take 1 tablet by mouth at bedtime.     calcium acetate 667 MG capsule  Commonly known as:  PHOSLO  Take 667-2,001 mg by mouth 5 (five) times daily. Takes 2 capsules three times a day with meals.  Also takes 1-3 capsule with snacks, dose depends on what the snack is     carvedilol 25 MG tablet  Commonly known as:  COREG  Take 12.5 mg by mouth 2 (two) times daily with a meal.     ciprofloxacin 500 MG tablet  Commonly known as:  CIPRO  Take 1 tablet (500 mg total) by mouth daily with breakfast.     isosorbide dinitrate 20 MG tablet  Commonly known as:  ISORDIL  Take 20 mg by mouth 2 (two) times daily.     metroNIDAZOLE 500 MG tablet  Commonly known as:  FLAGYL  Take 1 tablet (500 mg total) by mouth 3 (three) times daily.     oxyCODONE-acetaminophen 5-325 MG per tablet  Commonly known as:  PERCOCET/ROXICET  Take 1 tablet by mouth at bedtime as needed for moderate pain.       Allergies  Allergen Reactions  . Ace Inhibitors Cough   Follow-up Information    Follow up with HILL,GERALD K, MD. Schedule an appointment as soon as possible for a visit in 1 week.    Specialty:  Family Medicine   Why:  Hospital follow up   Contact information:   [address] [id number] [phone]        The results of significant diagnostics from this hospitalization (including imaging, microbiology, ancillary and laboratory) are listed below for reference.    Significant Diagnostic Studies: Ct Abdomen Pelvis Wo Contrast  12/18/2014   CLINICAL DATA:  Acute right lower quadrant abdominal pain.  EXAM: CT ABDOMEN AND PELVIS WITHOUT CONTRAST  TECHNIQUE: Multidetector CT imaging of the abdomen and pelvis was performed following the standard protocol without IV contrast.  COMPARISON:  CT scan of May 19, 2011.  FINDINGS: Severe degenerative disc disease is noted at L4-5. Visualized lung bases appear normal.  No gallstones are noted. The spleen and pancreas appear normal. 12 mm low density is noted in left hepatic  lobe which is increased in size compared to prior exam and demonstrates average Hounsfield measurement of 38. Adrenal glands appear normal. Status post right nephrectomy. Left renal atrophy is noted. No hydronephrosis or renal obstruction is noted. Atherosclerosis of abdominal aorta is noted without aneurysm formation. The appendix appears normal. Inflammatory changes are noted adjacent to descending colon most consistent with focal diverticulitis. Urinary bladder is decompressed. Small fat containing periumbilical hernia is noted which is decreased in size compared to prior exam. No significant adenopathy is noted.  IMPRESSION: Severe focal diverticulitis of descending colon is noted. No abscess is seen at this time.  Atherosclerosis of abdominal aorta is noted without aneurysm formation.  Left renal atrophy.  Status post right nephrectomy.  12 mm low density seen in left hepatic lobe with average Hounsfield measurement of 38. This is increased in size compared to prior exam. This may simply represent atypical cyst or hemangioma, but further evaluation  with MRI on nonemergent basis is recommended.   Electronically Signed   By: Marijo Conception, M.D.   On: 12/18/2014 14:36    Microbiology: No results found for this or any previous visit (from the past 240 hour(s)).   Labs: Basic Metabolic Panel:  Recent Labs Lab 12/18/14 1226 12/19/14 0536 12/20/14 0724 12/21/14 0517  NA 134* 136 131* 134*  K 4.1 4.5 4.8 4.6  CL 92* 97* 92* 97*  CO2 33* 27 28 28   GLUCOSE 78 75 109* 77  BUN 10 15 21* 17  CREATININE 7.07* 9.35* 11.99* 9.21*  CALCIUM 9.2 10.0 9.6 9.6  PHOS  --   --  6.3*  --    Liver Function Tests:  Recent Labs Lab 12/18/14 1226 12/19/14 0536 12/20/14 0724  AST 14* 11*  --   ALT 8* 8*  --   ALKPHOS 51 54  --   BILITOT 0.8 0.6  --   PROT 7.6 8.1  --   ALBUMIN 3.1* 3.1* 3.0*    Recent Labs Lab 12/18/14 1226  LIPASE 18*   No results for input(s): AMMONIA in the last 168 hours. CBC:  Recent Labs Lab 12/18/14 1226 12/19/14 0536 12/20/14 0723  WBC 5.3 5.5 7.1  NEUTROABS 3.5  --   --   HGB 11.1* 10.3* 9.8*  HCT 33.5* 31.6* 29.6*  MCV 94.4 95.8 94.6  PLT 181 171 186   Cardiac Enzymes: No results for input(s): CKTOTAL, CKMB, CKMBINDEX, TROPONINI in the last 168 hours. BNP: BNP (last 3 results) No results for input(s): BNP in the last 8760 hours.  ProBNP (last 3 results) No results for input(s): PROBNP in the last 8760 hours.  CBG:  Recent Labs Lab 12/18/14 1757 12/18/14 2030  GLUCAP 84 89     Signed:  Temeka Pore  [facility] 12/21/2014, 9:14 AM

## 2014-12-20 NOTE — Discharge Instructions (Signed)

## 2014-12-20 NOTE — Progress Notes (Signed)
  [facility] Progress Note   Subjective: pain not much better  Filed Vitals:   12/20/14 0830 12/20/14 0900 12/20/14 0930 12/20/14 1000  BP: 134/85 136/80 138/83 131/79  Pulse: 82 83 88 86  Temp:      TempSrc:      Resp: 19 14 18 22   Height:      Weight:      SpO2:       Exam: Aelrt, no distress No jvd Chest clear bilat RRR no MRG Abd obese, soft , tender LLQ, no rebound No LE edema Neuro is alert, Ox 3 LFA AVF patent  MWF [facility]   4h  120kg   2/2.25 [place]  LFA AVF   Heparin 12000 Hect 4 ug      Assessment: 1. Sigmoid diverticulitis - on empiric abx, pain +/- a little better, wants to eat , to advance diet after HD 2. ESRD HD today 3. HTN cont norvasc (coreg/ avapro on home med list but pt says he's not taking so we are holding), bp's are good here 4. Vol is 1 kg over dry wt, no gross excess on exam 5. MBD cont meds 6. Anemia Hb 10's stable, no meds  Plan - HD today, minimal UF, advancing diet as Orest Dikes MD  pager [phone]    cell [phone]  12/20/2014, 10:43 AM     Recent Labs Lab 12/18/14 1226 12/19/14 0536 12/20/14 0724  NA 134* 136 131*  K 4.1 4.5 4.8  CL 92* 97* 92*  CO2 33* 27 28  GLUCOSE 78 75 109*  BUN 10 15 21*  CREATININE 7.07* 9.35* 11.99*  CALCIUM 9.2 10.0 9.6  PHOS  --   --  6.3*    Recent Labs Lab 12/18/14 1226 12/19/14 0536 12/20/14 0724  AST 14* 11*  --   ALT 8* 8*  --   ALKPHOS 51 54  --   BILITOT 0.8 0.6  --   PROT 7.6 8.1  --   ALBUMIN 3.1* 3.1* 3.0*    Recent Labs Lab 12/18/14 1226 12/19/14 0536 12/20/14 0723  WBC 5.3 5.5 7.1  NEUTROABS 3.5  --   --   HGB 11.1* 10.3* 9.8*  HCT 33.5* 31.6* 29.6*  MCV 94.4 95.8 94.6  PLT 181 171 186   . amLODipine  10 mg Oral QPC lunch  . aspirin  325 mg Oral Daily  . calcium acetate  1,334 mg Oral TID WC  . ciprofloxacin  400 mg Intravenous Q24H  . docusate sodium  100 mg Oral BID  . heparin  5,000 Units Subcutaneous 3 times per day  .  isosorbide dinitrate  20 mg Oral BID  . metronidazole  500 mg Intravenous Q8H  . sodium chloride  3 mL Intravenous Q12H     sodium chloride, sodium chloride, acetaminophen **OR** acetaminophen, alteplase, calcium acetate, diphenhydrAMINE, feeding supplement (NEPRO CARB STEADY), heparin, heparin, HYDROmorphone (DILAUDID) injection, lidocaine (PF), lidocaine-prilocaine, ondansetron **OR** ondansetron (ZOFRAN) IV, pentafluoroprop-tetrafluoroeth

## 2014-12-21 LAB — BASIC METABOLIC PANEL
Anion gap: 9 (ref 5–15)
BUN: 17 mg/dL (ref 6–20)
CO2: 28 mmol/L (ref 22–32)
Calcium: 9.6 mg/dL (ref 8.9–10.3)
Chloride: 97 mmol/L — ABNORMAL LOW (ref 101–111)
Creatinine, Ser: 9.21 mg/dL — ABNORMAL HIGH (ref 0.61–1.24)
GFR calc Af Amer: 7 mL/min — ABNORMAL LOW (ref >60)
GFR calc non Af Amer: 6 mL/min — ABNORMAL LOW (ref >60)
Glucose, Bld: 77 mg/dL (ref 65–99)
Potassium: 4.6 mmol/L (ref 3.5–5.1)
Sodium: 134 mmol/L — ABNORMAL LOW (ref 135–145)

## 2014-12-21 NOTE — Progress Notes (Signed)
12/21/2014 11:21 AM  [name] to be D/C'd Home per MD order.  Discussed prescriptions and follow up appointments with the patient. Prescriptions given to patient, medication list explained in detail. Pt verbalized understanding.    Medication List    STOP taking these medications        valsartan 320 MG tablet  Commonly known as:  DIOVAN      TAKE these medications        amLODipine 10 MG tablet  Commonly known as:  NORVASC  Take 10 mg by mouth daily after lunch.     aspirin 325 MG tablet  Take 325 mg by mouth daily.     b complex-vitamin c-folic acid 0.8 MG Tabs tablet  Take 1 tablet by mouth at bedtime.     calcium acetate 667 MG capsule  Commonly known as:  PHOSLO  Take 667-2,001 mg by mouth 5 (five) times daily. Takes 2 capsules three times a day with meals.  Also takes 1-3 capsule with snacks, dose depends on what the snack is     carvedilol 25 MG tablet  Commonly known as:  COREG  Take 12.5 mg by mouth 2 (two) times daily with a meal.     ciprofloxacin 500 MG tablet  Commonly known as:  CIPRO  Take 1 tablet (500 mg total) by mouth daily with breakfast.     isosorbide dinitrate 20 MG tablet  Commonly known as:  ISORDIL  Take 20 mg by mouth 2 (two) times daily.     metroNIDAZOLE 500 MG tablet  Commonly known as:  FLAGYL  Take 1 tablet (500 mg total) by mouth 3 (three) times daily.     oxyCODONE-acetaminophen 5-325 MG per tablet  Commonly known as:  PERCOCET/ROXICET  Take 1 tablet by mouth at bedtime as needed for moderate pain.        Filed Vitals:   12/21/14 0804  BP: 140/79  Pulse: 84  Temp: 98 F (36.7 C)  Resp: 18    Skin clean, dry and intact without evidence of skin break down, no evidence of skin tears noted. IV catheter discontinued intact. Site without signs and symptoms of complications. Dressing and pressure applied. Pt denies pain at this time. No complaints noted.  An After Visit Summary was printed and given to the patient. Patient  escorted via [place], and D/C home via private auto.  [employer], RN-BC, [employer] [facility] Phone [phone]

## 2014-12-21 NOTE — Progress Notes (Signed)
[facility] Progress Note  Assessment/Plan: 1. Sigmoid diverticulitis - on empiric abx, pain +/- a little better, wants to eat , to advance diet after HD 2. ESRD MWF - lower edw 119 3. HTN /volume cont norvasc (coreg/ avapro on home med list but pt says he's not taking so we are holding), bp's are good here- net UF 2 L on Friday 4. MBD cont phoslo - resume hectorol at d/c 5. Anemia Hb 9.8 stable - resume ESA at d/c- will evaluate outpt previous dosing  Myriam Jacobson, PA-C [facility] Beeper [phone] 12/21/2014,9:23 AM  LOS: 3 days   Pt seen, examined and agree w A/P as above.  Kelly Splinter MD pager [phone]    cell [phone] 12/21/2014, 10:13 AM    Subjective:   Ate about 1/2 of breakfast - going home today; no problems with HD yesterday - came off below edw  Objective Filed Vitals:   12/20/14 1824 12/20/14 2100 12/21/14 0500 12/21/14 0804  BP: 160/72 133/79 132/70 140/79  Pulse: 84 86 76 84  Temp: 98.7 F (37.1 C) 98.7 F (37.1 C) 98.4 F (36.9 C) 98 F (36.7 C)  TempSrc: Oral Oral Oral Oral  Resp: 20 18 18 18   Height:      Weight:      SpO2: 100% 99% 100% 100%   Physical Exam General: NAD  Heart:RRR Lungs: no rales Abdomen: obese soft no sig tenderness Extremities:no edema Dialysis Access: left AVF + bruit  Dialysis Orders: 4h 120kg 2/2.25 Bath LFA AVF Heparin 12000 Hect 4 ug   Additional Objective Labs: Basic Metabolic Panel:  Recent Labs Lab 12/19/14 0536 12/20/14 0724 12/21/14 0517  NA 136 131* 134*  K 4.5 4.8 4.6  CL 97* 92* 97*  CO2 27 28 28   GLUCOSE 75 109* 77  BUN 15 21* 17  CREATININE 9.35* 11.99* 9.21*  CALCIUM 10.0 9.6 9.6  PHOS  --  6.3*  --    Liver Function Tests:  Recent Labs Lab 12/18/14 1226 12/19/14 0536 12/20/14 0724  AST 14* 11*  --   ALT 8* 8*  --   ALKPHOS 51 54  --   BILITOT 0.8 0.6  --   PROT 7.6 8.1  --   ALBUMIN 3.1* 3.1* 3.0*    Recent Labs Lab 12/18/14 1226   LIPASE 18*   CBC:  Recent Labs Lab 12/18/14 1226 12/19/14 0536 12/20/14 0723  WBC 5.3 5.5 7.1  NEUTROABS 3.5  --   --   HGB 11.1* 10.3* 9.8*  HCT 33.5* 31.6* 29.6*  MCV 94.4 95.8 94.6  PLT 181 171 186   Blood Culture    Component Value Date/Time   SDES URINE, RANDOM 01/18/2011 0758   SPECREQUEST NONE 01/18/2011 0758   CULT NO GROWTH 01/18/2011 0758   REPTSTATUS 01/19/2011 FINAL 01/18/2011 0758   CBG:  Recent Labs Lab 12/18/14 1757 12/18/14 2030  GLUCAP 84 89  Medications:   . amLODipine  10 mg Oral QPC lunch  . aspirin  325 mg Oral Daily  . calcium acetate  1,334 mg Oral TID WC  . ciprofloxacin  400 mg Intravenous Q24H  . docusate sodium  100 mg Oral BID  . heparin  5,000 Units Subcutaneous 3 times per day  . isosorbide dinitrate  20 mg Oral BID  . metronidazole  500 mg Intravenous Q8H  . sodium chloride  3 mL Intravenous Q12H

## 2014-12-22 DIAGNOSIS — Z992 Dependence on renal dialysis: Secondary | ICD-10-CM | POA: Diagnosis not present

## 2014-12-22 DIAGNOSIS — N186 End stage renal disease: Secondary | ICD-10-CM | POA: Diagnosis not present

## 2014-12-22 DIAGNOSIS — I120 Hypertensive chronic kidney disease with stage 5 chronic kidney disease or end stage renal disease: Secondary | ICD-10-CM | POA: Diagnosis not present

## 2014-12-23 DIAGNOSIS — D509 Iron deficiency anemia, unspecified: Secondary | ICD-10-CM | POA: Diagnosis not present

## 2014-12-23 DIAGNOSIS — D631 Anemia in chronic kidney disease: Secondary | ICD-10-CM | POA: Diagnosis not present

## 2014-12-23 DIAGNOSIS — N2581 Secondary hyperparathyroidism of renal origin: Secondary | ICD-10-CM | POA: Diagnosis not present

## 2014-12-23 DIAGNOSIS — N186 End stage renal disease: Secondary | ICD-10-CM | POA: Diagnosis not present

## 2014-12-24 ENCOUNTER — Emergency Department (HOSPITAL_COMMUNITY)
Admission: EM | Admit: 2014-12-24 | Discharge: 2014-12-24 | Disposition: A | Discharge status: Home / Self Care | Payer: Medicare Other | Attending: Emergency Medicine | Admitting: Emergency Medicine

## 2014-12-24 ENCOUNTER — Encounter (HOSPITAL_COMMUNITY): Payer: Self-pay

## 2014-12-24 ENCOUNTER — Emergency Department (HOSPITAL_COMMUNITY): Payer: Medicare Other

## 2014-12-24 ENCOUNTER — Emergency Department (HOSPITAL_BASED_OUTPATIENT_CLINIC_OR_DEPARTMENT_OTHER)
Admit: 2014-12-24 | Discharge: 2014-12-24 | Disposition: A | Discharge status: Home / Self Care | Payer: Medicare Other | Attending: Emergency Medicine | Admitting: Emergency Medicine

## 2014-12-24 DIAGNOSIS — I252 Old myocardial infarction: Secondary | ICD-10-CM | POA: Diagnosis not present

## 2014-12-24 DIAGNOSIS — H5441 Blindness, right eye, normal vision left eye: Secondary | ICD-10-CM | POA: Diagnosis not present

## 2014-12-24 DIAGNOSIS — I509 Heart failure, unspecified: Secondary | ICD-10-CM | POA: Insufficient documentation

## 2014-12-24 DIAGNOSIS — Z0189 Encounter for other specified special examinations: Secondary | ICD-10-CM

## 2014-12-24 DIAGNOSIS — Z85528 Personal history of other malignant neoplasm of kidney: Secondary | ICD-10-CM | POA: Diagnosis not present

## 2014-12-24 DIAGNOSIS — Z7982 Long term (current) use of aspirin: Secondary | ICD-10-CM | POA: Insufficient documentation

## 2014-12-24 DIAGNOSIS — Z992 Dependence on renal dialysis: Secondary | ICD-10-CM | POA: Diagnosis not present

## 2014-12-24 DIAGNOSIS — Z1389 Encounter for screening for other disorder: Secondary | ICD-10-CM | POA: Insufficient documentation

## 2014-12-24 DIAGNOSIS — T82828A Fibrosis of vascular prosthetic devices, implants and grafts, initial encounter: Secondary | ICD-10-CM

## 2014-12-24 DIAGNOSIS — Z792 Long term (current) use of antibiotics: Secondary | ICD-10-CM | POA: Diagnosis not present

## 2014-12-24 DIAGNOSIS — Z79899 Other long term (current) drug therapy: Secondary | ICD-10-CM | POA: Insufficient documentation

## 2014-12-24 DIAGNOSIS — R079 Chest pain, unspecified: Secondary | ICD-10-CM | POA: Insufficient documentation

## 2014-12-24 DIAGNOSIS — N186 End stage renal disease: Secondary | ICD-10-CM | POA: Diagnosis not present

## 2014-12-24 DIAGNOSIS — I120 Hypertensive chronic kidney disease with stage 5 chronic kidney disease or end stage renal disease: Secondary | ICD-10-CM | POA: Diagnosis not present

## 2014-12-24 DIAGNOSIS — I2510 Atherosclerotic heart disease of native coronary artery without angina pectoris: Secondary | ICD-10-CM | POA: Insufficient documentation

## 2014-12-24 DIAGNOSIS — M79602 Pain in left arm: Secondary | ICD-10-CM | POA: Diagnosis present

## 2014-12-24 DIAGNOSIS — E119 Type 2 diabetes mellitus without complications: Secondary | ICD-10-CM | POA: Insufficient documentation

## 2014-12-24 DIAGNOSIS — Z8673 Personal history of transient ischemic attack (TIA), and cerebral infarction without residual deficits: Secondary | ICD-10-CM | POA: Diagnosis not present

## 2014-12-24 DIAGNOSIS — M109 Gout, unspecified: Secondary | ICD-10-CM | POA: Diagnosis not present

## 2014-12-24 DIAGNOSIS — I10 Essential (primary) hypertension: Secondary | ICD-10-CM | POA: Diagnosis not present

## 2014-12-24 DIAGNOSIS — R0602 Shortness of breath: Secondary | ICD-10-CM | POA: Diagnosis not present

## 2014-12-24 LAB — CBC WITH DIFFERENTIAL/PLATELET
Basophils Absolute: 0 10*3/uL (ref 0.0–0.1)
Basophils Relative: 0 % (ref 0–1)
Eosinophils Absolute: 0.1 10*3/uL (ref 0.0–0.7)
Eosinophils Relative: 3 % (ref 0–5)
HCT: 28.5 % — ABNORMAL LOW (ref 39.0–52.0)
Hemoglobin: 9.5 g/dL — ABNORMAL LOW (ref 13.0–17.0)
Lymphocytes Relative: 18 % (ref 12–46)
Lymphs Abs: 0.9 10*3/uL (ref 0.7–4.0)
MCH: 31.4 pg (ref 26.0–34.0)
MCHC: 33.3 g/dL (ref 30.0–36.0)
MCV: 94.1 fL (ref 78.0–100.0)
Monocytes Absolute: 0.7 10*3/uL (ref 0.1–1.0)
Monocytes Relative: 13 % — ABNORMAL HIGH (ref 3–12)
Neutro Abs: 3.3 10*3/uL (ref 1.7–7.7)
Neutrophils Relative %: 66 % (ref 43–77)
Platelets: 266 10*3/uL (ref 150–400)
RBC: 3.03 MIL/uL — ABNORMAL LOW (ref 4.22–5.81)
RDW: 13.8 % (ref 11.5–15.5)
WBC: 4.9 10*3/uL (ref 4.0–10.5)

## 2014-12-24 LAB — BASIC METABOLIC PANEL
Anion gap: 8 (ref 5–15)
BUN: 17 mg/dL (ref 6–20)
CO2: 29 mmol/L (ref 22–32)
Calcium: 9.8 mg/dL (ref 8.9–10.3)
Chloride: 98 mmol/L — ABNORMAL LOW (ref 101–111)
Creatinine, Ser: 10.3 mg/dL — ABNORMAL HIGH (ref 0.61–1.24)
GFR calc Af Amer: 6 mL/min — ABNORMAL LOW (ref >60)
GFR calc non Af Amer: 5 mL/min — ABNORMAL LOW (ref >60)
Glucose, Bld: 82 mg/dL (ref 65–99)
Potassium: 4.5 mmol/L (ref 3.5–5.1)
Sodium: 135 mmol/L (ref 135–145)

## 2014-12-24 LAB — I-STAT TROPONIN, ED: Troponin i, poc: 0.04 ng/mL (ref 0.00–0.08)

## 2014-12-24 NOTE — Discharge Instructions (Signed)
Varicose Veins Varicose veins are veins that have become enlarged and twisted. CAUSES This condition is the result of valves in the veins not working properly. Valves in the veins help return blood from the leg to the heart. If these valves are damaged, blood flows backwards and backs up into the veins in the leg near the skin. This causes the veins to become larger. People who are on their feet a lot, who are pregnant, or who are overweight are more likely to develop varicose veins. SYMPTOMS   Bulging, twisted-appearing, bluish veins, most commonly found on the legs.  Leg pain or a feeling of heaviness. These symptoms may be worse at the end of the day.  Leg swelling.  Skin color changes. DIAGNOSIS  Varicose veins can usually be diagnosed with an exam of your legs by your caregiver. He or she may recommend an ultrasound of your leg veins. TREATMENT  Most varicose veins can be treated at home.However, other treatments are available for people who have persistent symptoms or who want to treat the cosmetic appearance of the varicose veins. These include:  Laser treatment of very small varicose veins.  Medicine that is shot (injected) into the vein. This medicine hardens the walls of the vein and closes off the vein. This treatment is called sclerotherapy. Afterwards, you may need to wear clothing or bandages that apply pressure.  Surgery. HOME CARE INSTRUCTIONS   Do not stand or sit in one position for long periods of time. Do not sit with your legs crossed. Rest with your legs raised during the day.  Wear elastic stockings or support hose. Do not wear other tight, encircling garments around the legs, pelvis, or waist.  Walk as much as possible to increase blood flow.  Raise the foot of your bed at night with 2-inch blocks.  If you get a cut in the skin over the vein and the vein bleeds, lie down with your leg raised and press on it with a clean cloth until the bleeding stops. Then  place a bandage (dressing) on the cut. See your caregiver if it continues to bleed or needs stitches. SEEK MEDICAL CARE IF:   The skin around your ankle starts to break down.  You have pain, redness, tenderness, or hard swelling developing in your leg over a vein.  You are uncomfortable due to leg pain. Document Released: 02/17/2005 Document Revised: 08/02/2011 Document Reviewed: 07/06/2010 [facility] Patient Information 2015 [place], [place]. This information is not intended to replace advice given to you by your health care provider. Make sure you discuss any questions you have with your health care provider.    Chest Pain (Nonspecific) It is often hard to give a specific diagnosis for the cause of chest pain. There is always a chance that your pain could be related to something serious, such as a heart attack or a blood clot in the lungs. You need to follow up with your health care provider for further evaluation. CAUSES   Heartburn.  Pneumonia or bronchitis.  Anxiety or stress.  Inflammation around your heart (pericarditis) or lung (pleuritis or pleurisy).  A blood clot in the lung.  A collapsed lung (pneumothorax). It can develop suddenly on its own (spontaneous pneumothorax) or from trauma to the chest.  Shingles infection (herpes zoster virus). The chest wall is composed of bones, muscles, and cartilage. Any of these can be the source of the pain.  The bones can be bruised by injury.  The muscles or cartilage can  be strained by coughing or overwork.  The cartilage can be affected by inflammation and become sore (costochondritis). DIAGNOSIS  Lab tests or other studies may be needed to find the cause of your pain. Your health care provider may have you take a test called an ambulatory electrocardiogram (ECG). An ECG records your heartbeat patterns over a 24-hour period. You may also have other tests, such as:  Transthoracic echocardiogram (TTE). During echocardiography,  sound waves are used to evaluate how blood flows through your heart.  Transesophageal echocardiogram (TEE).  Cardiac monitoring. This allows your health care provider to monitor your heart rate and rhythm in real time.  Holter monitor. This is a portable device that records your heartbeat and can help diagnose heart arrhythmias. It allows your health care provider to track your heart activity for several days, if needed.  Stress tests by exercise or by giving medicine that makes the heart beat faster. TREATMENT   Treatment depends on what may be causing your chest pain. Treatment may include:  Acid blockers for heartburn.  Anti-inflammatory medicine.  Pain medicine for inflammatory conditions.  Antibiotics if an infection is present.  You may be advised to change lifestyle habits. This includes stopping smoking and avoiding alcohol, caffeine, and chocolate.  You may be advised to keep your head raised (elevated) when sleeping. This reduces the chance of acid going backward from your stomach into your esophagus. Most of the time, nonspecific chest pain will improve within 2-3 days with rest and mild pain medicine.  HOME CARE INSTRUCTIONS   If antibiotics were prescribed, take them as directed. Finish them even if you start to feel better.  For the next few days, avoid physical activities that bring on chest pain. Continue physical activities as directed.  Do not use any tobacco products, including cigarettes, chewing tobacco, or electronic cigarettes.  Avoid drinking alcohol.  Only take medicine as directed by your health care provider.  Follow your health care provider's suggestions for further testing if your chest pain does not go away.  Keep any follow-up appointments you made. If you do not go to an appointment, you could develop lasting (chronic) problems with pain. If there is any problem keeping an appointment, call to reschedule. SEEK MEDICAL CARE IF:   Your chest  pain does not go away, even after treatment.  You have a rash with blisters on your chest.  You have a fever. SEEK IMMEDIATE MEDICAL CARE IF:   You have increased chest pain or pain that spreads to your arm, neck, jaw, back, or abdomen.  You have shortness of breath.  You have an increasing cough, or you cough up blood.  You have severe back or abdominal pain.  You feel nauseous or vomit.  You have severe weakness.  You faint.  You have chills. This is an emergency. Do not wait to see if the pain will go away. Get medical help at once. Call your local emergency services (911 in [place] Do not drive yourself to the hospital. MAKE SURE YOU:   Understand these instructions.  Will watch your condition.  Will get help right away if you are not doing well or get worse. Document Released: 02/17/2005 Document Revised: 05/15/2013 Document Reviewed: 12/14/2007 [facility] Patient Information 2015 [place], [place]. This information is not intended to replace advice given to you by your health care provider. Make sure you discuss any questions you have with your health care provider.

## 2014-12-24 NOTE — ED Provider Notes (Signed)
CSN: [id number]     Arrival date & time 12/24/14  1021 History   First MD Initiated Contact with Patient 12/24/14 1049     Chief Complaint  Patient presents with  . Arm Problem     (Consider location/radiation/quality/duration/timing/severity/associated sxs/prior Treatment) HPI  54 year old male presents with 2 "bumps" in his left upper arm. He went to bed with no symptoms but then woke up this morning and when he is putting on his shirt he knows that he had these bumps. They seem to be pulsating. His left forearm he has a fistula that was last accessed yesterday for normal dialysis. These bumps are not painful but do seem to be pulsating. Has never had any surgery or fistula in his upper arm. Patient is also asking why he has chest pain. When the EKG leads were placed on his lower left anterior chest he states it started hurting. Denies any pain before that. No shortness of breath.  Past Medical History  Diagnosis Date  . CHF (congestive heart failure)   . History of unilateral nephrectomy   . Hypertension   . Cardiomyopathy   . Gout   . Myocardial infarction ?2006  . Shortness of breath 05/19/11    "at rest, lying down, w/exertion"  . Umbilical hernia [id number]    unrepaired  . Stroke 02/2011    05/19/11 denies residual  . Dialysis patient     Mon-Wed-Fri  . Diabetes mellitus     pt. states he's borderline diabetic., no longer taking med,- off med. since 2013  . Renal cell carcinoma     dialysis- MWF  . ESRD (end stage renal disease)   . Coronary artery disease     normal coronaries by 10/10/08 cath  . History of nephrectomy 07/04/2012    History of right nephrectomy in 2002 for renal cell carcinoma   . 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 Nephrectomy  2000    right  . Av fistula placement  03/29/2011    Procedure: ARTERIOVENOUS (AV) FISTULA CREATION;  Surgeon: Hinda Lenis, MD;  Location: [place];  Service: Vascular;  Laterality: Left;  LEFT RADIOCEPHALIC , Arteriovenous [id number])  . Smashed  1990's    "left pinky; have a plate in there"  . US echocardiography  05/20/11  . Cardiac catheterization  05/21/11  . Finger surgery      L pinkie finger- ORIF- /w remaining hardware - 1990's    . Umbilical hernia repair N/A 07/04/2012    Procedure: LAPAROSCOPIC UMBILICAL HERNIA;  Surgeon: Madilyn Hook, DO;  Location: [place];  Service: General;  Laterality: N/A;  . Insertion of mesh N/A 07/04/2012    Procedure: INSERTION OF MESH;  Surgeon: Madilyn Hook, DO;  Location: [place];  Service: General;  Laterality: N/A;  . Hernia repair N/A [id number]    Umbilical hernia repair  . Dialysis cath placed    . Revison of arteriovenous fistula Left 06/05/2013    Procedure: REVISON OF LEFT RADIOCEPHALIC  ARTERIOVENOUS FISTULA;  Surgeon: Conrad Cornell, MD;  Location: [place];  Service: Vascular;  Laterality: Left;  . Right heart catheterization N/A 05/21/2011    Procedure: RIGHT HEART CATH;  Surgeon: Birdie Riddle, MD;  Location[facility] C[facility] LAB;  Service: Cardiovascular;  Laterality: N/A;  . Left and right heart catheterization with coronary angiogram N/A 08/04/2012    Procedure: LEFT AND RIGHT HEART  CATHETERIZATION WITH CORONARY ANGIOGRAM;  Surgeon: Birdie Riddle, MD;  Location: [place] CATH LAB;  Service: Cardiovascular;  Laterality: N/A;   Family History  Problem Relation Age of Onset  . Hypertension Mother   . Kidney disease Mother    History  Substance Use Topics  . Smoking status: Never Smoker   . Smokeless tobacco: Never Used  . Alcohol Use: No    Review of Systems  Respiratory: Negative for shortness of breath.   Cardiovascular: Positive for chest pain.  Musculoskeletal: Positive for arthralgias.  Neurological: Negative for weakness and numbness.  All other systems reviewed and are negative.     Allergies  Ace inhibitors  Home Medications   Prior to Admission medications   Medication  Sig Start Date End Date Taking? Authorizing Provider  amLODipine (NORVASC) 10 MG tablet Take 10 mg by mouth daily after lunch.    Yes Historical Provider, MD  aspirin 325 MG tablet Take 325 mg by mouth daily.    Yes Historical Provider, MD  b complex-vitamin c-folic acid (NEPHRO-VITE) 0.8 MG TABS tablet Take 1 tablet by mouth at bedtime.   Yes Historical Provider, MD  calcium acetate (PHOSLO) 667 MG capsule Take 667-2,001 mg by mouth 5 (five) times daily. Takes 2 capsules three times a day with meals.  Also takes 1-3 capsule with snacks, dose depends on what the snack is   Yes Historical Provider, MD  carvedilol (COREG) 25 MG tablet Take 12.5 mg by mouth 2 (two) times daily with a meal.   Yes Historical Provider, MD  ciprofloxacin (CIPRO) 500 MG tablet Take 1 tablet (500 mg total) by mouth daily with breakfast. 12/20/14  Yes Maryann Mikhail, DO  isosorbide dinitrate (ISORDIL) 20 MG tablet Take 20 mg by mouth 2 (two) times daily.    Yes Historical Provider, MD  metroNIDAZOLE (FLAGYL) 500 MG tablet Take 1 tablet (500 mg total) by mouth 3 (three) times daily. 12/20/14  Yes Maryann Mikhail, DO  oxyCODONE-acetaminophen (PERCOCET/ROXICET) 5-325 MG per tablet Take 1 tablet by mouth at bedtime as needed for moderate pain. 12/20/14  Yes Maryann Mikhail, DO   BP 142/89 mmHg  Pulse 85  Temp(Src) 98.3 F (36.8 C) (Oral)  Resp 17  SpO2 100% Physical Exam  Constitutional: He is oriented to person, place, and time. He appears well-developed and well-nourished.  HENT:  Head: Normocephalic and atraumatic.  Right Ear: External ear normal.  Left Ear: External ear normal.  Nose: Nose normal.  Eyes: Right eye exhibits no discharge. Left eye exhibits no discharge.  Neck: Neck supple.  Cardiovascular: Normal rate, regular rhythm, normal heart sounds and intact distal pulses.   Pulmonary/Chest: Effort normal. He exhibits tenderness.    Abdominal: Soft. There is no tenderness.  Musculoskeletal: He exhibits no  edema.       Arms: Neurological: He is alert and oriented to person, place, and time.  Skin: Skin is warm and dry.  Nursing note and vitals reviewed.   ED Course  Procedures (including critical care time) Labs Review Labs Reviewed  BASIC METABOLIC PANEL - Abnormal; Notable for the following:    Chloride 98 (*)    Creatinine, Ser 10.30 (*)    GFR calc non Af Amer 5 (*)    GFR calc Af Amer 6 (*)    All other components within normal limits  CBC WITH DIFFERENTIAL/PLATELET - Abnormal; Notable for the following:    RBC 3.03 (*)    Hemoglobin 9.5 (*)    HCT 28.5 (*)  Monocytes Relative 13 (*)    All other components within normal limits  I-STAT TROPOININ, ED    Imaging Review Dg Chest 2 View  12/24/2014   CLINICAL DATA:  Shortness of breath.  Chest pain.  EXAM: CHEST  2 VIEW  COMPARISON:  None.  FINDINGS: Mediastinum hilar structures normal. Cardiomegaly with pulmonary vascular prominence. Mild interstitial prominence consistent with mild interstitial edema. Small left pleural effusion. No acute bony abnormality.  IMPRESSION: Cardiomegaly with mild pulmonary vascular prominence and interstitial prominence suggesting mild congestive heart failure. Small left pleural effusion.   Electronically Signed   By: Marcello Moores  Register   On: 12/24/2014 12:34   VASCULAR LAB PRELIMINARY PRELIMINARY PRELIMINARY PRELIMINARY  Duplex of left arm HD access completed.   Preliminary report: No evidence of thrombosis on the AVF. Both arterial and venous sides are patent. Cephalic vein is superficial and dilated at the two areas of concern in the upper arm.   Cestone,Helene, RVT 12/24/2014, 2:34 PM   EKG Interpretation   Date/Time:  Tuesday December 24 2014 10:32:46 EDT Ventricular Rate:  78 PR Interval:  175 QRS Duration: 108 QT Interval:  414 QTC Calculation: 472 R Axis:   -10 Text Interpretation:  Sinus rhythm Left ventricular hypertrophy no acute  ST/T changes nonspecific T waves not  present when compared to 2014  Confirmed by Chadley Dziedzic  MD, [place] ([place]) on 12/24/2014 12:03:31 PM      MDM   Final diagnoses:  Encounter for imaging of left cephalic vein    Patient's ultrasound shows no blood clot or significant abnormalities. He does have 2 areas of dilated cephalic vein with palpable thrill. I discussed this with Dr. Scot Dock of vascular surgery states this is not uncommon given this is connected to the fistula to this vein and there is no further treatment needed. As for his very atypical chest pain the patient's EKG is unremarkable. He has no shortness of breath. This started since his EKG leads were placed and now he has no pain now that EKG leads are off. Most likely musculoskeletal. Do not feel further ACS workup is indicated he will be discharged home to follow-up with his PCP.    Sherwood Gambler, MD 12/24/14 [phone]

## 2014-12-24 NOTE — Progress Notes (Signed)
VASCULAR LAB PRELIMINARY  PRELIMINARY  PRELIMINARY  PRELIMINARY  Duplex of left arm HD access completed.    Preliminary report:  No evidence of thrombosis on the AVF.  Both arterial and venous sides are patent.  Cephalic vein is superficial and dilated at the two areas of concern in the upper arm.    Ashanti Ratti, RVT 12/24/2014, 2:34 PM

## 2014-12-24 NOTE — ED Notes (Signed)
MD at bedside. 

## 2014-12-24 NOTE — ED Notes (Signed)
Pt was treated at [facility] last week for diverticulitis. Pt c/o 2 raised bumps in upper left arm. Fistula in left arm, went to dialysis yesterday (dialysis MWF). C/o some numbness/tingling in left hand.

## 2014-12-26 ENCOUNTER — Encounter: Payer: Self-pay | Admitting: Gastroenterology

## 2014-12-31 DIAGNOSIS — E1122 Type 2 diabetes mellitus with diabetic chronic kidney disease: Secondary | ICD-10-CM | POA: Diagnosis not present

## 2014-12-31 DIAGNOSIS — N186 End stage renal disease: Secondary | ICD-10-CM | POA: Diagnosis not present

## 2014-12-31 DIAGNOSIS — K5732 Diverticulitis of large intestine without perforation or abscess without bleeding: Secondary | ICD-10-CM | POA: Diagnosis not present

## 2015-01-02 DIAGNOSIS — N186 End stage renal disease: Secondary | ICD-10-CM | POA: Diagnosis not present

## 2015-01-02 DIAGNOSIS — T82858D Stenosis of vascular prosthetic devices, implants and grafts, subsequent encounter: Secondary | ICD-10-CM | POA: Diagnosis not present

## 2015-01-02 DIAGNOSIS — I871 Compression of vein: Secondary | ICD-10-CM | POA: Diagnosis not present

## 2015-01-02 DIAGNOSIS — Z992 Dependence on renal dialysis: Secondary | ICD-10-CM | POA: Diagnosis not present

## 2015-01-05 ENCOUNTER — Emergency Department (HOSPITAL_COMMUNITY)
Admission: EM | Admit: 2015-01-05 | Discharge: 2015-01-05 | Disposition: A | Discharge status: Home / Self Care | Payer: Medicare Other | Attending: Emergency Medicine | Admitting: Emergency Medicine

## 2015-01-05 ENCOUNTER — Encounter (HOSPITAL_COMMUNITY): Payer: Self-pay | Admitting: Emergency Medicine

## 2015-01-05 ENCOUNTER — Emergency Department (HOSPITAL_COMMUNITY): Payer: Medicare Other

## 2015-01-05 DIAGNOSIS — Z992 Dependence on renal dialysis: Secondary | ICD-10-CM | POA: Diagnosis not present

## 2015-01-05 DIAGNOSIS — Z79899 Other long term (current) drug therapy: Secondary | ICD-10-CM | POA: Diagnosis not present

## 2015-01-05 DIAGNOSIS — Z8719 Personal history of other diseases of the digestive system: Secondary | ICD-10-CM | POA: Insufficient documentation

## 2015-01-05 DIAGNOSIS — Z8739 Personal history of other diseases of the musculoskeletal system and connective tissue: Secondary | ICD-10-CM | POA: Diagnosis not present

## 2015-01-05 DIAGNOSIS — R0602 Shortness of breath: Secondary | ICD-10-CM | POA: Diagnosis not present

## 2015-01-05 DIAGNOSIS — Z8673 Personal history of transient ischemic attack (TIA), and cerebral infarction without residual deficits: Secondary | ICD-10-CM | POA: Diagnosis not present

## 2015-01-05 DIAGNOSIS — R0600 Dyspnea, unspecified: Secondary | ICD-10-CM

## 2015-01-05 DIAGNOSIS — E119 Type 2 diabetes mellitus without complications: Secondary | ICD-10-CM | POA: Insufficient documentation

## 2015-01-05 DIAGNOSIS — N186 End stage renal disease: Secondary | ICD-10-CM | POA: Insufficient documentation

## 2015-01-05 DIAGNOSIS — I2510 Atherosclerotic heart disease of native coronary artery without angina pectoris: Secondary | ICD-10-CM | POA: Diagnosis not present

## 2015-01-05 DIAGNOSIS — I252 Old myocardial infarction: Secondary | ICD-10-CM | POA: Diagnosis not present

## 2015-01-05 DIAGNOSIS — Z85528 Personal history of other malignant neoplasm of kidney: Secondary | ICD-10-CM | POA: Insufficient documentation

## 2015-01-05 DIAGNOSIS — I509 Heart failure, unspecified: Secondary | ICD-10-CM | POA: Diagnosis not present

## 2015-01-05 DIAGNOSIS — I120 Hypertensive chronic kidney disease with stage 5 chronic kidney disease or end stage renal disease: Secondary | ICD-10-CM | POA: Diagnosis not present

## 2015-01-05 DIAGNOSIS — Z792 Long term (current) use of antibiotics: Secondary | ICD-10-CM | POA: Diagnosis not present

## 2015-01-05 DIAGNOSIS — Z9861 Coronary angioplasty status: Secondary | ICD-10-CM | POA: Diagnosis not present

## 2015-01-05 DIAGNOSIS — Z7982 Long term (current) use of aspirin: Secondary | ICD-10-CM | POA: Insufficient documentation

## 2015-01-05 DIAGNOSIS — R079 Chest pain, unspecified: Secondary | ICD-10-CM | POA: Diagnosis not present

## 2015-01-05 LAB — I-STAT TROPONIN, ED: Troponin i, poc: 0.03 ng/mL (ref 0.00–0.08)

## 2015-01-05 LAB — CBC
HCT: 29.1 % — ABNORMAL LOW (ref 39.0–52.0)
Hemoglobin: 9.4 g/dL — ABNORMAL LOW (ref 13.0–17.0)
MCH: 30.3 pg (ref 26.0–34.0)
MCHC: 32.3 g/dL (ref 30.0–36.0)
MCV: 93.9 fL (ref 78.0–100.0)
Platelets: 221 10*3/uL (ref 150–400)
RBC: 3.1 MIL/uL — ABNORMAL LOW (ref 4.22–5.81)
RDW: 15 % (ref 11.5–15.5)
WBC: 6.3 10*3/uL (ref 4.0–10.5)

## 2015-01-05 LAB — BASIC METABOLIC PANEL
Anion gap: 11 (ref 5–15)
BUN: 25 mg/dL — ABNORMAL HIGH (ref 6–20)
CO2: 29 mmol/L (ref 22–32)
Calcium: 9.6 mg/dL (ref 8.9–10.3)
Chloride: 97 mmol/L — ABNORMAL LOW (ref 101–111)
Creatinine, Ser: 10.05 mg/dL — ABNORMAL HIGH (ref 0.61–1.24)
GFR calc Af Amer: 6 mL/min — ABNORMAL LOW (ref >60)
GFR calc non Af Amer: 5 mL/min — ABNORMAL LOW (ref >60)
Glucose, Bld: 113 mg/dL — ABNORMAL HIGH (ref 65–99)
Potassium: 4.1 mmol/L (ref 3.5–5.1)
Sodium: 137 mmol/L (ref 135–145)

## 2015-01-05 LAB — D-DIMER, QUANTITATIVE: D-Dimer, Quant: 2.81 ug/mL-FEU — ABNORMAL HIGH (ref 0.00–0.48)

## 2015-01-05 LAB — BRAIN NATRIURETIC PEPTIDE: B Natriuretic Peptide: 1205.8 pg/mL — ABNORMAL HIGH (ref 0.0–100.0)

## 2015-01-05 MED ORDER — IOHEXOL 350 MG/ML SOLN
100.0000 mL | Freq: Once | INTRAVENOUS | Status: AC | PRN
Start: 2015-01-05 — End: 2015-01-05
  Administered 2015-01-05: 100 mL via INTRAVENOUS

## 2015-01-05 MED ORDER — AMLODIPINE BESYLATE 5 MG PO TABS
10.0000 mg | ORAL_TABLET | ORAL | Status: AC
  Administered 2015-01-05: 10 mg via ORAL
  Filled 2015-01-05: qty 2

## 2015-01-05 MED ORDER — ALBUTEROL SULFATE (2.5 MG/3ML) 0.083% IN NEBU
5.0000 mg | INHALATION_SOLUTION | Freq: Once | RESPIRATORY_TRACT | Status: AC
  Administered 2015-01-05: 5 mg via RESPIRATORY_TRACT
  Filled 2015-01-05: qty 6

## 2015-01-05 MED ORDER — ISOSORBIDE DINITRATE 20 MG PO TABS
20.0000 mg | ORAL_TABLET | ORAL | Status: AC
  Administered 2015-01-05: 20 mg via ORAL
  Filled 2015-01-05: qty 1

## 2015-01-05 NOTE — Discharge Instructions (Signed)
Please make dialysis aware of your recent visits to the ED. Recheck with your cardiologist this week. Please return if you are worse at any time especially worsening shortness of breath or chest pain.

## 2015-01-05 NOTE — ED Notes (Signed)
Patient here with complaint of shortness of breath and chest pain. States that symptoms have been present for about 7 days. ESRD present; MWF schedule; no treatments missed recently.

## 2015-01-05 NOTE — ED Provider Notes (Signed)
CSN: [id number]     Arrival date & time 01/05/15  0230 History   First MD Initiated Contact with Patient 01/05/15 0703     Chief Complaint  Patient presents with  . Shortness of Breath  . Chest Pain     (Consider location/radiation/quality/duration/timing/severity/associated sxs/prior Treatment) HPI   54 year old male history of hypertension, on dialysis Monday Wednesday Friday, last dialyzed 2 days ago who presents today complaining of 2 weeks of increasing dyspnea. He describes this as similar to previous episodes of congestive heart failure. He states that he feels like he has something in the back of his throat that he can't get up. He has had some cough productive of clear sputum. He denies fever, chills, or change in color of sputum. He is not a smoker. He states that it does get worse with exertion or laying flat. He reports no change in dry weight or changes in his dialysis process. He was discharged from the hospital 2 weeks ago after being admitted for diverticulitis. He states that he has continued to have a poor appetite and continued to have pain in his left lower quadrant. He reports taking his medications as prescribed without any recent changes.  He reports no history of blood clots in his legs or lungs. He reports no recent immobilizations besides the hospitalization. He denies any lateralized swelling.  Past Medical History  Diagnosis Date  . CHF (congestive heart failure)   . History of unilateral nephrectomy   . Hypertension   . Cardiomyopathy   . Gout   . Myocardial infarction ?2006  . Shortness of breath 05/19/11    "at rest, lying down, w/exertion"  . Umbilical hernia [id number]    unrepaired  . Stroke 02/2011    05/19/11 denies residual  . Dialysis patient     Mon-Wed-Fri  . Diabetes mellitus     pt. states he's borderline diabetic., no longer taking med,- off med. since 2013  . Renal cell carcinoma     dialysis- MWF  . ESRD (end stage renal disease)   .  Coronary artery disease     normal coronaries by 10/10/08 cath  . History of nephrectomy 07/04/2012    History of right nephrectomy in 2002 for renal cell carcinoma   . 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 Nephrectomy  2000    right  . Av fistula placement  03/29/2011    Procedure: ARTERIOVENOUS (AV) FISTULA CREATION;  Surgeon: Hinda Lenis, MD;  Location: [place];  Service: Vascular;  Laterality: Left;  LEFT RADIOCEPHALIC , Arteriovenous [id number])  . Smashed  1990's    "left pinky; have a plate in there"  . US echocardiography  05/20/11  . Cardiac catheterization  05/21/11  . Finger surgery      L pinkie finger- ORIF- /w remaining hardware - 1990's    . Umbilical hernia repair N/A 07/04/2012    Procedure: LAPAROSCOPIC UMBILICAL HERNIA;  Surgeon: Madilyn Hook, DO;  Location: [place];  Service: General;  Laterality: N/A;  . Insertion of mesh N/A 07/04/2012    Procedure: INSERTION OF MESH;  Surgeon: Madilyn Hook, DO;  Location: [place];  Service: General;  Laterality: N/A;  . Hernia repair N/A [id number]    Umbilical hernia repair  . Dialysis cath placed    . Revison of arteriovenous fistula Left 06/05/2013    Procedure: REVISON OF LEFT RADIOCEPHALIC  ARTERIOVENOUS  FISTULA;  Surgeon: Conrad Big Rock, MD;  Location: [place];  Service: Vascular;  Laterality: Left;  . Right heart catheterization N/A 05/21/2011    Procedure: RIGHT HEART CATH;  Surgeon: Birdie Riddle, MD;  Location: [facility] CATH LAB;  Service: Cardiovascular;  Laterality: N/A;  . Left and right heart catheterization with coronary angiogram N/A 08/04/2012    Procedure: LEFT AND RIGHT HEART CATHETERIZATION WITH CORONARY ANGIOGRAM;  Surgeon: Birdie Riddle, MD;  Location: [place] CATH LAB;  Service: Cardiovascular;  Laterality: N/A;   Family History  Problem Relation Age of Onset  . Hypertension Mother   . Kidney disease Mother    Social History  Substance Use  Topics  . Smoking status: Never Smoker   . Smokeless tobacco: Never Used  . Alcohol Use: No    Review of Systems  All other systems reviewed and are negative.     Allergies  Ace inhibitors  Home Medications   Prior to Admission medications   Medication Sig Start Date End Date Taking? Authorizing Provider  amLODipine (NORVASC) 10 MG tablet Take 10 mg by mouth daily after lunch.     Historical Provider, MD  aspirin 325 MG tablet Take 325 mg by mouth daily.     Historical Provider, MD  b complex-vitamin c-folic acid (NEPHRO-VITE) 0.8 MG TABS tablet Take 1 tablet by mouth at bedtime.    Historical Provider, MD  calcium acetate (PHOSLO) 667 MG capsule Take 667-2,001 mg by mouth 5 (five) times daily. Takes 2 capsules three times a day with meals.  Also takes 1-3 capsule with snacks, dose depends on what the snack is    Historical Provider, MD  carvedilol (COREG) 25 MG tablet Take 12.5 mg by mouth 2 (two) times daily with a meal.    Historical Provider, MD  ciprofloxacin (CIPRO) 500 MG tablet Take 1 tablet (500 mg total) by mouth daily with breakfast. 12/20/14   Velta Addison Mikhail, DO  isosorbide dinitrate (ISORDIL) 20 MG tablet Take 20 mg by mouth now.     Historical Provider, MD  metroNIDAZOLE (FLAGYL) 500 MG tablet Take 1 tablet (500 mg total) by mouth 3 (three) times daily. 12/20/14   Maryann Mikhail, DO  oxyCODONE-acetaminophen (PERCOCET/ROXICET) 5-325 MG per tablet Take 1 tablet by mouth at bedtime as needed for moderate pain. 12/20/14   Maryann Mikhail, DO   BP 155/95 mmHg  Pulse 88  Temp(Src) 98.9 F (37.2 C) (Oral)  Resp 19  Ht 6\' 1"  (1.854 m)  Wt 256 lb (116.121 kg)  BMI 33.78 kg/m2  SpO2 100% Physical Exam  Constitutional: He is oriented to person, place, and time. He appears well-developed and well-nourished. No distress.  Obese  HENT:  Head: Normocephalic and atraumatic.  Right Ear: External ear normal.  Left Ear: External ear normal.  Nose: Nose normal.  Mouth/Throat:  Oropharynx is clear and moist.  Eyes: Conjunctivae and EOM are normal. Pupils are equal, round, and reactive to light.  Neck: Normal range of motion. Neck supple.  Cardiovascular: Normal rate, regular rhythm, normal heart sounds and intact distal pulses.   Pulmonary/Chest: Effort normal. No respiratory distress.  Some diminished breath sounds bilateral bases with few scattered expiratory wheezes.  Abdominal: Soft. Bowel sounds are normal. There is tenderness.  Mild tenderness to palpation left lower quadrant, no rigidity, no rebound  Musculoskeletal:  Left forearm with fistula in place Some edema lateral lower extremities.  Neurological: He is alert and oriented to person, place, and time. He has normal reflexes. No  cranial nerve deficit. Coordination normal.  Skin: Skin is warm and dry.  Psychiatric: He has a normal mood and affect. His behavior is normal. Judgment and thought content normal.  Nursing note and vitals reviewed.   ED Course  Procedures (including critical care time) Labs Review Labs Reviewed  BASIC METABOLIC PANEL - Abnormal; Notable for the following:    Chloride 97 (*)    Glucose, Bld 113 (*)    BUN 25 (*)    Creatinine, Ser 10.05 (*)    GFR calc non Af Amer 5 (*)    GFR calc Af Amer 6 (*)    All other components within normal limits  CBC - Abnormal; Notable for the following:    RBC 3.10 (*)    Hemoglobin 9.4 (*)    HCT 29.1 (*)    All other components within normal limits  BRAIN NATRIURETIC PEPTIDE  D-DIMER, QUANTITATIVE (NOT AT [facility])  Randolm Idol, ED    Imaging Review Dg Chest 2 View  01/05/2015   CLINICAL DATA:  Acute onset of generalized chest pain and shortness of breath. Initial encounter.  EXAM: CHEST  2 VIEW  COMPARISON:  Chest radiograph performed 12/24/2014  FINDINGS: The lungs are well-aerated. Mild bibasilar atelectasis is noted. Pulmonary vascularity is at the upper limits of normal. There is no evidence of pleural effusion or  pneumothorax.  The heart is enlarged.  No acute osseous abnormalities are seen.  IMPRESSION: Mild bibasilar atelectasis noted.  Cardiomegaly seen.   Electronically Signed   By: Garald Balding M.D.   On: 01/05/2015 03:13   I, Vimal Derego S, personally reviewed and evaluated these images and lab results as part of my medical decision-making.   EKG Interpretation   Date/Time:  Sunday January 05 2015 02:39:42 EDT Ventricular Rate:  93 PR Interval:  182 QRS Duration: 100 QT Interval:  388 QTC Calculation: 482 R Axis:   3 Text Interpretation:  Normal sinus rhythm Nonspecific T wave abnormality  Prolonged QT Abnormal ECG When compared with ECG of 12/24/2014, No  significant change was found Confirmed by [facility]  MD, DAVID ([id number]) on  01/05/2015 6:49:27 AM      MDM   Final diagnoses:  Dyspnea  Chronic congestive heart failure, unspecified congestive heart failure type    IMr. Facenda is a 54 year old man on hemodialysis with a history of hypertension who presents today with worsening dyspnea. He has a history of congestive heart failure and has some evidence of increased volume. He was last dialyzed yesterday. He does not appear to be an any acute congestive heart failure. He has been recently immobilized and d-dimer was obtained which was elevated. CT of the chest reveals no evidence of large pulmonary embolism. Patient has remained hemodynamically stable here in the emergency department. I discussed need for further treatment as an outpatient and he and his wife voice understanding of the plan and return precautions.   Pattricia Boss, MD 01/05/15 [phone]

## 2015-01-05 NOTE — ED Notes (Signed)
PT reports that chest hurts when breathing deeply.

## 2015-01-06 DIAGNOSIS — I120 Hypertensive chronic kidney disease with stage 5 chronic kidney disease or end stage renal disease: Secondary | ICD-10-CM | POA: Diagnosis not present

## 2015-01-06 DIAGNOSIS — I5022 Chronic systolic (congestive) heart failure: Secondary | ICD-10-CM | POA: Diagnosis not present

## 2015-01-06 DIAGNOSIS — R1032 Left lower quadrant pain: Secondary | ICD-10-CM | POA: Diagnosis not present

## 2015-01-06 DIAGNOSIS — I2510 Atherosclerotic heart disease of native coronary artery without angina pectoris: Secondary | ICD-10-CM | POA: Diagnosis not present

## 2015-01-06 DIAGNOSIS — N186 End stage renal disease: Secondary | ICD-10-CM | POA: Diagnosis not present

## 2015-01-13 ENCOUNTER — Inpatient Hospital Stay (HOSPITAL_COMMUNITY)
Admission: EM | Admit: 2015-01-13 | Discharge: 2015-01-16 | DRG: 189 | Disposition: A | Discharge status: Home / Self Care | Payer: Medicare Other | Attending: Internal Medicine | Admitting: Internal Medicine

## 2015-01-13 ENCOUNTER — Inpatient Hospital Stay (HOSPITAL_COMMUNITY): Payer: Medicare Other

## 2015-01-13 ENCOUNTER — Encounter (HOSPITAL_COMMUNITY): Payer: Self-pay | Admitting: Vascular Surgery

## 2015-01-13 ENCOUNTER — Emergency Department (HOSPITAL_COMMUNITY): Payer: Medicare Other

## 2015-01-13 DIAGNOSIS — Z888 Allergy status to other drugs, medicaments and biological substances status: Secondary | ICD-10-CM | POA: Diagnosis not present

## 2015-01-13 DIAGNOSIS — Z905 Acquired absence of kidney: Secondary | ICD-10-CM | POA: Diagnosis present

## 2015-01-13 DIAGNOSIS — E669 Obesity, unspecified: Secondary | ICD-10-CM | POA: Diagnosis present

## 2015-01-13 DIAGNOSIS — N281 Cyst of kidney, acquired: Secondary | ICD-10-CM | POA: Diagnosis not present

## 2015-01-13 DIAGNOSIS — I252 Old myocardial infarction: Secondary | ICD-10-CM

## 2015-01-13 DIAGNOSIS — D649 Anemia, unspecified: Secondary | ICD-10-CM | POA: Diagnosis present

## 2015-01-13 DIAGNOSIS — N186 End stage renal disease: Secondary | ICD-10-CM | POA: Diagnosis not present

## 2015-01-13 DIAGNOSIS — E1122 Type 2 diabetes mellitus with diabetic chronic kidney disease: Secondary | ICD-10-CM | POA: Diagnosis present

## 2015-01-13 DIAGNOSIS — Z992 Dependence on renal dialysis: Secondary | ICD-10-CM | POA: Diagnosis not present

## 2015-01-13 DIAGNOSIS — H5441 Blindness, right eye, normal vision left eye: Secondary | ICD-10-CM | POA: Diagnosis present

## 2015-01-13 DIAGNOSIS — J9601 Acute respiratory failure with hypoxia: Secondary | ICD-10-CM | POA: Diagnosis not present

## 2015-01-13 DIAGNOSIS — I5022 Chronic systolic (congestive) heart failure: Secondary | ICD-10-CM | POA: Diagnosis not present

## 2015-01-13 DIAGNOSIS — I248 Other forms of acute ischemic heart disease: Secondary | ICD-10-CM | POA: Diagnosis present

## 2015-01-13 DIAGNOSIS — E119 Type 2 diabetes mellitus without complications: Secondary | ICD-10-CM | POA: Diagnosis not present

## 2015-01-13 DIAGNOSIS — Z8249 Family history of ischemic heart disease and other diseases of the circulatory system: Secondary | ICD-10-CM | POA: Diagnosis not present

## 2015-01-13 DIAGNOSIS — R0902 Hypoxemia: Secondary | ICD-10-CM | POA: Diagnosis not present

## 2015-01-13 DIAGNOSIS — M109 Gout, unspecified: Secondary | ICD-10-CM | POA: Diagnosis present

## 2015-01-13 DIAGNOSIS — J811 Chronic pulmonary edema: Secondary | ICD-10-CM | POA: Diagnosis not present

## 2015-01-13 DIAGNOSIS — M898X9 Other specified disorders of bone, unspecified site: Secondary | ICD-10-CM | POA: Diagnosis present

## 2015-01-13 DIAGNOSIS — Z794 Long term (current) use of insulin: Secondary | ICD-10-CM | POA: Diagnosis not present

## 2015-01-13 DIAGNOSIS — I120 Hypertensive chronic kidney disease with stage 5 chronic kidney disease or end stage renal disease: Secondary | ICD-10-CM | POA: Diagnosis present

## 2015-01-13 DIAGNOSIS — I509 Heart failure, unspecified: Secondary | ICD-10-CM

## 2015-01-13 DIAGNOSIS — I429 Cardiomyopathy, unspecified: Secondary | ICD-10-CM | POA: Diagnosis present

## 2015-01-13 DIAGNOSIS — R918 Other nonspecific abnormal finding of lung field: Secondary | ICD-10-CM | POA: Diagnosis not present

## 2015-01-13 DIAGNOSIS — I2510 Atherosclerotic heart disease of native coronary artery without angina pectoris: Secondary | ICD-10-CM | POA: Diagnosis present

## 2015-01-13 DIAGNOSIS — J810 Acute pulmonary edema: Secondary | ICD-10-CM

## 2015-01-13 DIAGNOSIS — R1011 Right upper quadrant pain: Secondary | ICD-10-CM

## 2015-01-13 DIAGNOSIS — N2581 Secondary hyperparathyroidism of renal origin: Secondary | ICD-10-CM | POA: Diagnosis present

## 2015-01-13 DIAGNOSIS — E8770 Fluid overload, unspecified: Secondary | ICD-10-CM | POA: Diagnosis not present

## 2015-01-13 DIAGNOSIS — Z6832 Body mass index (BMI) 32.0-32.9, adult: Secondary | ICD-10-CM | POA: Diagnosis not present

## 2015-01-13 DIAGNOSIS — Z7982 Long term (current) use of aspirin: Secondary | ICD-10-CM

## 2015-01-13 DIAGNOSIS — R0602 Shortness of breath: Secondary | ICD-10-CM | POA: Diagnosis not present

## 2015-01-13 DIAGNOSIS — Z85528 Personal history of other malignant neoplasm of kidney: Secondary | ICD-10-CM | POA: Diagnosis not present

## 2015-01-13 DIAGNOSIS — E1129 Type 2 diabetes mellitus with other diabetic kidney complication: Secondary | ICD-10-CM | POA: Diagnosis not present

## 2015-01-13 DIAGNOSIS — R079 Chest pain, unspecified: Secondary | ICD-10-CM | POA: Diagnosis not present

## 2015-01-13 DIAGNOSIS — I5042 Chronic combined systolic (congestive) and diastolic (congestive) heart failure: Secondary | ICD-10-CM | POA: Diagnosis present

## 2015-01-13 DIAGNOSIS — Z79899 Other long term (current) drug therapy: Secondary | ICD-10-CM

## 2015-01-13 DIAGNOSIS — I10 Essential (primary) hypertension: Secondary | ICD-10-CM | POA: Diagnosis not present

## 2015-01-13 DIAGNOSIS — E1140 Type 2 diabetes mellitus with diabetic neuropathy, unspecified: Secondary | ICD-10-CM | POA: Diagnosis not present

## 2015-01-13 DIAGNOSIS — I5023 Acute on chronic systolic (congestive) heart failure: Secondary | ICD-10-CM | POA: Diagnosis not present

## 2015-01-13 HISTORY — DX: Acute respiratory failure with hypoxia: J96.01

## 2015-01-13 LAB — BASIC METABOLIC PANEL
Anion gap: 10 (ref 5–15)
BUN: 9 mg/dL (ref 6–20)
CO2: 32 mmol/L (ref 22–32)
Calcium: 9.3 mg/dL (ref 8.9–10.3)
Chloride: 98 mmol/L — ABNORMAL LOW (ref 101–111)
Creatinine, Ser: 6.95 mg/dL — ABNORMAL HIGH (ref 0.61–1.24)
GFR calc Af Amer: 9 mL/min — ABNORMAL LOW (ref >60)
GFR calc non Af Amer: 8 mL/min — ABNORMAL LOW (ref >60)
Glucose, Bld: 81 mg/dL (ref 65–99)
Potassium: 3.6 mmol/L (ref 3.5–5.1)
Sodium: 140 mmol/L (ref 135–145)

## 2015-01-13 LAB — RENAL FUNCTION PANEL
Albumin: 3.1 g/dL — ABNORMAL LOW (ref 3.5–5.0)
Anion gap: 11 (ref 5–15)
BUN: 13 mg/dL (ref 6–20)
CO2: 30 mmol/L (ref 22–32)
Calcium: 9.2 mg/dL (ref 8.9–10.3)
Chloride: 96 mmol/L — ABNORMAL LOW (ref 101–111)
Creatinine, Ser: 7.79 mg/dL — ABNORMAL HIGH (ref 0.61–1.24)
GFR calc Af Amer: 8 mL/min — ABNORMAL LOW (ref >60)
GFR calc non Af Amer: 7 mL/min — ABNORMAL LOW (ref >60)
Glucose, Bld: 121 mg/dL — ABNORMAL HIGH (ref 65–99)
Phosphorus: 2.8 mg/dL (ref 2.5–4.6)
Potassium: 4 mmol/L (ref 3.5–5.1)
Sodium: 137 mmol/L (ref 135–145)

## 2015-01-13 LAB — HEPATIC FUNCTION PANEL
ALT: 8 U/L — ABNORMAL LOW (ref 17–63)
AST: 15 U/L (ref 15–41)
Albumin: 3.3 g/dL — ABNORMAL LOW (ref 3.5–5.0)
Alkaline Phosphatase: 59 U/L (ref 38–126)
Bilirubin, Direct: 0.1 mg/dL (ref 0.1–0.5)
Indirect Bilirubin: 1 mg/dL — ABNORMAL HIGH (ref 0.3–0.9)
Total Bilirubin: 1.1 mg/dL (ref 0.3–1.2)
Total Protein: 7.5 g/dL (ref 6.5–8.1)

## 2015-01-13 LAB — CBC WITH DIFFERENTIAL/PLATELET
Basophils Absolute: 0 10*3/uL (ref 0.0–0.1)
Basophils Relative: 1 % (ref 0–1)
Eosinophils Absolute: 0.1 10*3/uL (ref 0.0–0.7)
Eosinophils Relative: 4 % (ref 0–5)
HCT: 30.5 % — ABNORMAL LOW (ref 39.0–52.0)
Hemoglobin: 10.1 g/dL — ABNORMAL LOW (ref 13.0–17.0)
Lymphocytes Relative: 32 % (ref 12–46)
Lymphs Abs: 1.2 10*3/uL (ref 0.7–4.0)
MCH: 30.3 pg (ref 26.0–34.0)
MCHC: 33.1 g/dL (ref 30.0–36.0)
MCV: 91.6 fL (ref 78.0–100.0)
Monocytes Absolute: 0.3 10*3/uL (ref 0.1–1.0)
Monocytes Relative: 9 % (ref 3–12)
Neutro Abs: 2.1 10*3/uL (ref 1.7–7.7)
Neutrophils Relative %: 56 % (ref 43–77)
Platelets: 197 10*3/uL (ref 150–400)
RBC: 3.33 MIL/uL — ABNORMAL LOW (ref 4.22–5.81)
RDW: 14.6 % (ref 11.5–15.5)
WBC: 3.8 10*3/uL — ABNORMAL LOW (ref 4.0–10.5)

## 2015-01-13 LAB — MAGNESIUM: Magnesium: 2.1 mg/dL (ref 1.7–2.4)

## 2015-01-13 LAB — CBC
HCT: 29 % — ABNORMAL LOW (ref 39.0–52.0)
Hemoglobin: 9.6 g/dL — ABNORMAL LOW (ref 13.0–17.0)
MCH: 30.2 pg (ref 26.0–34.0)
MCHC: 33.1 g/dL (ref 30.0–36.0)
MCV: 91.2 fL (ref 78.0–100.0)
Platelets: 212 10*3/uL (ref 150–400)
RBC: 3.18 MIL/uL — ABNORMAL LOW (ref 4.22–5.81)
RDW: 14.6 % (ref 11.5–15.5)
WBC: 4.2 10*3/uL (ref 4.0–10.5)

## 2015-01-13 LAB — CREATININE, SERUM
Creatinine, Ser: 8.05 mg/dL — ABNORMAL HIGH (ref 0.61–1.24)
GFR calc Af Amer: 8 mL/min — ABNORMAL LOW (ref >60)
GFR calc non Af Amer: 7 mL/min — ABNORMAL LOW (ref >60)

## 2015-01-13 LAB — I-STAT TROPONIN, ED: Troponin i, poc: 0.06 ng/mL (ref 0.00–0.08)

## 2015-01-13 LAB — BRAIN NATRIURETIC PEPTIDE: B Natriuretic Peptide: 1555.2 pg/mL — ABNORMAL HIGH (ref 0.0–100.0)

## 2015-01-13 LAB — TROPONIN I: Troponin I: 0.41 ng/mL — ABNORMAL HIGH (ref <0.031)

## 2015-01-13 MED ORDER — ALTEPLASE 2 MG IJ SOLR
2.0000 mg | Freq: Once | INTRAMUSCULAR | Status: DC | PRN
  Filled 2015-01-13: qty 2

## 2015-01-13 MED ORDER — PENTAFLUOROPROP-TETRAFLUOROETH EX AERO: 1.0000 "application " | INHALATION_SPRAY | CUTANEOUS | Status: DC | PRN

## 2015-01-13 MED ORDER — NEPRO/CARBSTEADY PO LIQD: 237.0000 mL | ORAL | Status: DC | PRN

## 2015-01-13 MED ORDER — SODIUM CHLORIDE 0.9 % IV SOLN: 100.0000 mL | INTRAVENOUS | Status: DC | PRN

## 2015-01-13 MED ORDER — CALCIUM ACETATE (PHOS BINDER) 667 MG PO CAPS
667.0000 mg | ORAL_CAPSULE | ORAL | Status: DC
  Administered 2015-01-13: 1334 mg via ORAL
  Administered 2015-01-14: 1337 mg via ORAL
  Administered 2015-01-14: 1334 mg via ORAL
  Administered 2015-01-15 (×2): 667 mg via ORAL
  Filled 2015-01-13 (×3): qty 1

## 2015-01-13 MED ORDER — ONDANSETRON HCL 4 MG/2ML IJ SOLN: 4.0000 mg | Freq: Four times a day (QID) | INTRAMUSCULAR | Status: DC | PRN

## 2015-01-13 MED ORDER — HEPARIN SODIUM (PORCINE) 5000 UNIT/ML IJ SOLN
5000.0000 [IU] | Freq: Three times a day (TID) | INTRAMUSCULAR | Status: DC
  Administered 2015-01-13 – 2015-01-16 (×8): 5000 [IU] via SUBCUTANEOUS
  Filled 2015-01-13 (×8): qty 1

## 2015-01-13 MED ORDER — ONDANSETRON HCL 4 MG PO TABS: 4.0000 mg | ORAL_TABLET | Freq: Four times a day (QID) | ORAL | Status: DC | PRN

## 2015-01-13 MED ORDER — LIDOCAINE HCL (PF) 1 % IJ SOLN: 5.0000 mL | INTRAMUSCULAR | Status: DC | PRN

## 2015-01-13 MED ORDER — CARVEDILOL 12.5 MG PO TABS
12.5000 mg | ORAL_TABLET | Freq: Two times a day (BID) | ORAL | Status: DC
  Administered 2015-01-14 – 2015-01-15 (×3): 12.5 mg via ORAL
  Filled 2015-01-13 (×3): qty 1

## 2015-01-13 MED ORDER — SODIUM CHLORIDE 0.9 % IJ SOLN
3.0000 mL | INTRAMUSCULAR | Status: DC | PRN
Start: 2015-01-13 — End: 2015-01-16

## 2015-01-13 MED ORDER — ACETAMINOPHEN 325 MG PO TABS
650.0000 mg | ORAL_TABLET | Freq: Four times a day (QID) | ORAL | Status: DC | PRN
  Administered 2015-01-14: 650 mg via ORAL
  Filled 2015-01-13: qty 2

## 2015-01-13 MED ORDER — CALCIUM ACETATE 667 MG PO CAPS
1334.0000 mg | ORAL_CAPSULE | Freq: Three times a day (TID) | ORAL | Status: DC
  Administered 2015-01-14 (×2): 1334 mg via ORAL
  Filled 2015-01-13 (×10): qty 2

## 2015-01-13 MED ORDER — POLYETHYLENE GLYCOL 3350 17 G PO PACK: 17.0000 g | PACK | Freq: Every day | ORAL | Status: DC | PRN

## 2015-01-13 MED ORDER — SODIUM CHLORIDE 0.9 % IV SOLN
250.0000 mL | INTRAVENOUS | Status: DC | PRN
Start: 2015-01-13 — End: 2015-01-16

## 2015-01-13 MED ORDER — RENA-VITE PO TABS
1.0000 | ORAL_TABLET | Freq: Every day | ORAL | Status: DC
  Administered 2015-01-13 – 2015-01-15 (×3): 1 via ORAL
  Filled 2015-01-13 (×3): qty 1

## 2015-01-13 MED ORDER — ZOLPIDEM TARTRATE 5 MG PO TABS
5.0000 mg | ORAL_TABLET | Freq: Once | ORAL | Status: AC
  Administered 2015-01-13: 5 mg via ORAL
  Filled 2015-01-13: qty 1

## 2015-01-13 MED ORDER — HEPARIN SODIUM (PORCINE) 1000 UNIT/ML DIALYSIS: 1000.0000 [IU] | INTRAMUSCULAR | Status: DC | PRN

## 2015-01-13 MED ORDER — ISOSORBIDE DINITRATE 10 MG PO TABS
20.0000 mg | ORAL_TABLET | Freq: Every day | ORAL | Status: DC
  Administered 2015-01-13 – 2015-01-16 (×4): 20 mg via ORAL
  Filled 2015-01-13 (×4): qty 2

## 2015-01-13 MED ORDER — LIDOCAINE-PRILOCAINE 2.5-2.5 % EX CREA: 1.0000 "application " | TOPICAL_CREAM | CUTANEOUS | Status: DC | PRN

## 2015-01-13 MED ORDER — ASPIRIN 325 MG PO TABS
325.0000 mg | ORAL_TABLET | Freq: Every day | ORAL | Status: DC
  Administered 2015-01-13 – 2015-01-16 (×4): 325 mg via ORAL
  Filled 2015-01-13 (×4): qty 1

## 2015-01-13 MED ORDER — HEPARIN SODIUM (PORCINE) 1000 UNIT/ML DIALYSIS: 12000.0000 [IU] | Freq: Once | INTRAMUSCULAR | Status: DC

## 2015-01-13 MED ORDER — SODIUM CHLORIDE 0.9 % IJ SOLN
3.0000 mL | Freq: Two times a day (BID) | INTRAMUSCULAR | Status: DC
  Administered 2015-01-13 – 2015-01-15 (×4): 3 mL via INTRAVENOUS

## 2015-01-13 MED ORDER — AMLODIPINE BESYLATE 10 MG PO TABS
10.0000 mg | ORAL_TABLET | Freq: Every day | ORAL | Status: DC
  Administered 2015-01-14 – 2015-01-16 (×3): 10 mg via ORAL
  Filled 2015-01-13 (×3): qty 1

## 2015-01-13 MED ORDER — ACETAMINOPHEN 650 MG RE SUPP: 650.0000 mg | Freq: Four times a day (QID) | RECTAL | Status: DC | PRN

## 2015-01-13 NOTE — ED Notes (Signed)
Pt reports to the ED fore val of SOB x 2-3 weeks. Pt has been seen several times for the same thing, last visit was Sunday 8/14. He reports he had a negative workup however his symptoms never resolved. He is a dialysis patient and had his full dialysis and was sent here after. Pt reports orthopnea and minimal exertion make the SOB worse. Lung sounds diminished in the bases. Pt denies any CP but reports some chest congestion. Reports clear productive cough. He is slightly tacyhpnic but can speak in full sentences. Pt A&Ox4. Skin warm and dry.

## 2015-01-13 NOTE — ED Notes (Signed)
Pt ambulated successfully for approximately 50 paces before stopping to break. SpO2 dropped from 100 to low 90s before climbing back up following a short break.

## 2015-01-13 NOTE — H&P (Signed)
[facility] History and Physical  Simranjit Ramberg [id number] DOB: 11/12/1960 DOA: 01/13/2015  Referring physician: Dr. Judene Companion PCP: Maggie Font, MD   Chief Complaint: sob  HPI: [name] is a 54 y.o. male with past medical history of diverticulitis, diabetes mellitus type 2, systolic heart failure with end-stage renal disease who dialyzes Monday Wednesday Friday who presents to the hospital because of dyspnea on exertion between going on for 3 weeks progressively getting worse. He relates that his dialysis center usually weighs 120 kg, on his last dialysis he was actually brought to 116 kg and he still felt short of breath. He relates he's not able to lay flat due to his shortness of breath, he's been also having dyspnea on exertion he relates he normally can walk around a mile but now he can even walk to the bathroom without being short of breath. He denies any fever or any productive cough, diarrhea or sick contacts.  In the ED: Basic metabolic panel was done which was within normal limits a BNP was drawn that was 1500 at higher than it was on the last time he was here in the hospital, his CBC shows a mild leukopenia and anemia, chest x-ray was done that shows interstitial pulmonary patterns, CT imaging was done and due to respiration the study was limited.  Review of systems:  Constitutional:  No weight loss, night sweats, Fevers, chills, fatigue.  HEENT:  No headaches, Difficulty swallowing,Tooth/dental problems,Sore throat,  No sneezing, itching, ear ache, nasal congestion, post nasal drip,  Cardio-vascular:  swelling in lower extremities, anasarca, dizziness, palpitations  GI:  No heartburn, indigestion, abdominal pain, nausea, vomiting, diarrhea, change in bowel habits, loss of appetite  Resp:   No non-productive cough, No coughing up of blood.No change in color of mucus.No wheezing.No chest wall deformity  Skin:  no rash or lesions.  GU:  no dysuria, change in color of  urine, no urgency or frequency. No flank pain.  Musculoskeletal:  No joint pain or swelling. No decreased range of motion. No back pain.  Psych:  No change in mood or affect. No depression or anxiety. No memory loss.   Past Medical History  Diagnosis Date  . CHF (congestive heart failure)   . History of unilateral nephrectomy   . Hypertension   . Cardiomyopathy   . Gout   . Myocardial infarction ?2006  . Shortness of breath 05/19/11    "at rest, lying down, w/exertion"  . Umbilical hernia [id number]    unrepaired  . Stroke 02/2011    05/19/11 denies residual  . Dialysis patient     Mon-Wed-Fri  . Diabetes mellitus     pt. states he's borderline diabetic., no longer taking med,- off med. since 2013  . Renal cell carcinoma     dialysis- MWF  . ESRD (end stage renal disease)   . Coronary artery disease     normal coronaries by 10/10/08 cath  . History of nephrectomy 07/04/2012    History of right nephrectomy in 2002 for renal cell carcinoma   . 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 Nephrectomy  2000    right  . Av fistula placement  03/29/2011    Procedure: ARTERIOVENOUS (AV) FISTULA CREATION;  Surgeon: Hinda Lenis, MD;  Location: [place];  Service: Vascular;  Laterality: Left;  LEFT RADIOCEPHALIC , Arteriovenous [id number])  . Smashed  1990's    "  left pinky; have a plate in there"  . US echocardiography  05/20/11  . Cardiac catheterization  05/21/11  . Finger surgery      L pinkie finger- ORIF- /w remaining hardware - 1990's    . Umbilical hernia repair N/A 07/04/2012    Procedure: LAPAROSCOPIC UMBILICAL HERNIA;  Surgeon: Madilyn Hook, DO;  Location: [facility];  Service: General;  Laterality: N/A;  . Insertion of mesh N/A 07/04/2012    Procedure: INSERTION OF MESH;  Surgeon: Madilyn Hook, DO;  Location: [place];  Service: General;  Laterality: N/A;  . Hernia repair N/A [id number]    Umbilical hernia  repair  . Dialysis cath placed    . Revison of arteriovenous fistula Left 06/05/2013    Procedure: REVISON OF LEFT RADIOCEPHALIC  ARTERIOVENOUS FISTULA;  Surgeon: Conrad Corrales, MD;  Location: [place];  Service: Vascular;  Laterality: Left;  . Right heart catheterization N/A 05/21/2011    Procedure: RIGHT HEART CATH;  Surgeon: Birdie Riddle, MD;  Location: [facility] CATH LAB;  Service: Cardiovascular;  Laterality: N/A;  . Left and right heart catheterization with coronary angiogram N/A 08/04/2012    Procedure: LEFT AND RIGHT HEART CATHETERIZATION WITH CORONARY ANGIOGRAM;  Surgeon: Birdie Riddle, MD;  Location: [place] CATH LAB;  Service: Cardiovascular;  Laterality: N/A;   Social History:  reports that he has never smoked. He has never used smokeless tobacco. He reports that he does not drink alcohol or use illicit drugs.  Allergies  Allergen Reactions  . Ace Inhibitors Cough    Family History  Problem Relation Age of Onset  . Hypertension Mother   . Kidney disease Mother     Prior to Admission medications   Medication Sig Start Date End Date Taking? Authorizing Provider  amLODipine (NORVASC) 10 MG tablet Take 10 mg by mouth daily after lunch.    Yes Historical Provider, MD  aspirin 325 MG tablet Take 325 mg by mouth daily.    Yes Historical Provider, MD  b complex-vitamin c-folic acid (NEPHRO-VITE) 0.8 MG TABS tablet Take 1 tablet by mouth at bedtime.   Yes Historical Provider, MD  calcium acetate (PHOSLO) 667 MG capsule Take 667-2,001 mg by mouth 5 (five) times daily. Takes 2 capsules three times a day with meals.  Also takes 1-3 capsule with snacks, dose depends on what the snack is   Yes Historical Provider, MD  isosorbide dinitrate (ISORDIL) 20 MG tablet Take 20 mg by mouth daily.    Yes Historical Provider, MD  carvedilol (COREG) 25 MG tablet Take 12.5 mg by mouth 2 (two) times daily with a meal.    Historical Provider, MD  ciprofloxacin (CIPRO) 500 MG tablet Take 1 tablet (500 mg total) by  mouth daily with breakfast. Patient not taking: Reported on 01/05/2015 12/20/14   Velta Addison Mikhail, DO  metroNIDAZOLE (FLAGYL) 500 MG tablet Take 1 tablet (500 mg total) by mouth 3 (three) times daily. Patient not taking: Reported on 01/05/2015 12/20/14   Cristal Ford, DO  oxyCODONE-acetaminophen (PERCOCET/ROXICET) 5-325 MG per tablet Take 1 tablet by mouth at bedtime as needed for moderate pain. 12/20/14   Cristal Ford, DO   Physical Exam: Filed Vitals:   01/13/15 1407 01/13/15 1500 01/13/15 1501 01/13/15 1530  BP: 159/100 168/100  153/99  Pulse:   104 95  Temp:      TempSrc:      Resp: 22  17 20   SpO2:   98% 98%    Wt Readings from Last 3 Encounters:  01/05/15 116.121 kg (256 lb)  12/20/14 118.9 kg (262 lb 2 oz)  09/03/14 122.018 kg (269 lb)    General:  Appears calm and comfortable Eyes: PERRL, normal lids, irises & conjunctiva ENT: grossly normal hearing, lips & tongue Neck: no LADPositive hepatojugular reflux Cardiovascular: RRR, no m/r/g. No LE edema. Telemetry: SR, no arrhythmias  Respiratory: CTA bilaterally, no w/r/r. Normal respiratory effort. Abdomen: sofRight upper quadrant tenderness no rebound or guarding. Skin: no rash or induration seen on limited exam Musculoskeletal: grossly normal tone BUE/BLE Psychiatric: grossly normal mood and affect, speech fluent and appropriate Neurologic: grossly non-focal.          Labs on Admission:  Basic Metabolic Panel:  Recent Labs Lab 01/13/15 1431  NA 140  K 3.6  CL 98*  CO2 32  GLUCOSE 81  BUN 9  CREATININE 6.95*  CALCIUM 9.3   Liver Function Tests: No results for input(s): AST, ALT, ALKPHOS, BILITOT, PROT, ALBUMIN in the last 168 hours. No results for input(s): LIPASE, AMYLASE in the last 168 hours. No results for input(s): AMMONIA in the last 168 hours. CBC:  Recent Labs Lab 01/13/15 1431  WBC 3.8*  NEUTROABS 2.1  HGB 10.1*  HCT 30.5*  MCV 91.6  PLT 197   Cardiac Enzymes: No results for  input(s): CKTOTAL, CKMB, CKMBINDEX, TROPONINI in the last 168 hours.  BNP (last 3 results)  Recent Labs  01/05/15 0738 01/13/15 1431  BNP 1205.8* 1555.2*    ProBNP (last 3 results) No results for input(s): PROBNP in the last 8760 hours.  CBG: No results for input(s): GLUCAP in the last 168 hours.  Radiological Exams on Admission: Dg Chest 2 View  01/13/2015   CLINICAL DATA:  Shortness of breath 2-3 weeks  EXAM: CHEST  2 VIEW  COMPARISON:  01/05/2015  FINDINGS: Mild cardiomegaly noted with prominence of the central vascular pedicles. Mild prominence of the interstitial markings is noted with a few Kerley B-lines. Trace pleural effusions. No focal pulmonary opacity. No acute osseous abnormality. Flattening of the hemidiaphragms may suggest emphysema.  IMPRESSION: Cardiomegaly with probable mild interstitial edema and trace pleural effusions. No focal pulmonary opacity.   Electronically Signed   By: Conchita Paris M.D.   On: 01/13/2015 12:58    EKG: Independently reviewed. Sinus tachycardia axis prolonged QT some T-wave abnormalities in the anterior leads V1-V3.  Assessment/Plan Acute respiratory failure with hypoxia/  Pulmonary edema: - As mentioned before they have taken extra fluid off with his dialysis and still feels short of breath, his BNP was elevated higher than it in on previous admissions, I have personally reviewed the chest x-ray and it does show an interstitial pattern I am concerned about pulmonary edema. We'll consult renal for further dialysis. - I will go ahead and continue psychosis cardiac enzymes she does have an EF with an ejection fraction of 30%. - He has positive hepatojugular reflux.  RUQ pain: - He has right upper quadrant tenderness on examination, no rebound or guarding, check a hepatic function panel and abdominal ultrasound. - Abdominal pain is not made worse or better with foods. - He relates no diarrhea or constipation.  Systolic CHF, chronic:-  Positive hepatojugular reflux we'll continue his home medications and will consult renal for further dialysis.  DM II (diabetes mellitus, type II), controlled: - Has diagnosis of diabetes mellitus type 2, he's not on any insulin at home we'll check a hemoglobin A1c.  End stage renal disease: - He dialyzes Monday Wednesday and Fridays, the  emergency room physician has already consulted nephrology for possible dialysis.  He has a prolonged QRS: His potassium is within normal limits we'll go ahead and check a magnesium level.      Code Status: full DVT Prophylaxis:heparin Family Communication: none Disposition Plan: inpatient  Time spent: 65 min  Charlynne Cousins [facility] Pager [phone]

## 2015-01-13 NOTE — ED Provider Notes (Signed)
CSN: [id number]     Arrival date & time 01/13/15  1039 History   First MD Initiated Contact with Patient 01/13/15 1403     Chief Complaint  Patient presents with  . Shortness of Breath     (Consider location/radiation/quality/duration/timing/severity/associated sxs/prior Treatment) HPI 54 year old male who presents with shortness of breath. History of ESRD on HD, MWF and HTN. Was treated for a bout of diverticulitis, and shortly afterwards began to have increased episodes of SOB. Denies SOB at rest, but noticed orthopnea and PND with DOE. Now cannot mow grass, which he was normally able to do. Denies chest pain or pressure, but reports that his chest feels congested. He has been coughing more during this time, with clear sputum. He was seen in the emergency department 8/14 through the symptoms, and underwent CT PE showing no acute processes. He went to dialysis today, and was dialyzed to 116 kilograms. His prior dry weight is 120 kg. He continues to feel short of breath, and subsequently came to the ED for evaluation.    Past Medical History  Diagnosis Date  . CHF (congestive heart failure)   . History of unilateral nephrectomy   . Hypertension   . Cardiomyopathy   . Gout   . Myocardial infarction ?2006  . Shortness of breath 05/19/11    "at rest, lying down, w/exertion"  . Umbilical hernia [id number]    unrepaired  . Stroke 02/2011    05/19/11 denies residual  . Dialysis patient     Mon-Wed-Fri  . Diabetes mellitus     pt. states he's borderline diabetic., no longer taking med,- off med. since 2013  . Renal cell carcinoma     dialysis- MWF  . ESRD (end stage renal disease)   . Coronary artery disease     normal coronaries by 10/10/08 cath  . History of nephrectomy 07/04/2012    History of right nephrectomy in 2002 for renal cell carcinoma   . 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 Nephrectomy  2000     right  . Av fistula placement  03/29/2011    Procedure: ARTERIOVENOUS (AV) FISTULA CREATION;  Surgeon: Hinda Lenis, MD;  Location: [place];  Service: Vascular;  Laterality: Left;  LEFT RADIOCEPHALIC , Arteriovenous [id number])  . Smashed  1990's    "left pinky; have a plate in there"  . US echocardiography  05/20/11  . Cardiac catheterization  05/21/11  . Finger surgery      L pinkie finger- ORIF- /w remaining hardware - 1990's    . Umbilical hernia repair N/A 07/04/2012    Procedure: LAPAROSCOPIC UMBILICAL HERNIA;  Surgeon: Madilyn Hook, DO;  Location: [place];  Service: General;  Laterality: N/A;  . Insertion of mesh N/A 07/04/2012    Procedure: INSERTION OF MESH;  Surgeon: Madilyn Hook, DO;  Location: [place];  Service: General;  Laterality: N/A;  . Hernia repair N/A [id number]    Umbilical hernia repair  . Dialysis cath placed    . Revison of arteriovenous fistula Left 06/05/2013    Procedure: REVISON OF LEFT RADIOCEPHALIC  ARTERIOVENOUS FISTULA;  Surgeon: Conrad Defiance, MD;  Location: [place];  Service: Vascular;  Laterality: Left;  . Right heart catheterization N/A 05/21/2011    Procedure: RIGHT HEART CATH;  Surgeon: Birdie Riddle, MD;  Location: [facility] CATH LAB;  Service: Cardiovascular;  Laterality: N/A;  .  Left and right heart catheterization with coronary angiogram N/A 08/04/2012    Procedure: LEFT AND RIGHT HEART CATHETERIZATION WITH CORONARY ANGIOGRAM;  Surgeon: Birdie Riddle, MD;  Location: [place] CATH LAB;  Service: Cardiovascular;  Laterality: N/A;   Family History  Problem Relation Age of Onset  . Hypertension Mother   . Kidney disease Mother    Social History  Substance Use Topics  . Smoking status: Never Smoker   . Smokeless tobacco: Never Used  . Alcohol Use: No    Review of Systems 10/14 systems reviewed and are negative other than those stated in the HPI  Allergies  Ace inhibitors  Home Medications   Prior to Admission medications   Medication Sig Start Date End  Date Taking? Authorizing Provider  amLODipine (NORVASC) 10 MG tablet Take 10 mg by mouth daily after lunch.    Yes Historical Provider, MD  aspirin 325 MG tablet Take 325 mg by mouth daily.    Yes Historical Provider, MD  b complex-vitamin c-folic acid (NEPHRO-VITE) 0.8 MG TABS tablet Take 1 tablet by mouth at bedtime.   Yes Historical Provider, MD  calcium acetate (PHOSLO) 667 MG capsule Take 667-2,001 mg by mouth 5 (five) times daily. Takes 2 capsules three times a day with meals.  Also takes 1-3 capsule with snacks, dose depends on what the snack is   Yes Historical Provider, MD  isosorbide dinitrate (ISORDIL) 20 MG tablet Take 20 mg by mouth daily.    Yes Historical Provider, MD  carvedilol (COREG) 25 MG tablet Take 12.5 mg by mouth 2 (two) times daily with a meal.    Historical Provider, MD  ciprofloxacin (CIPRO) 500 MG tablet Take 1 tablet (500 mg total) by mouth daily with breakfast. Patient not taking: Reported on 01/05/2015 12/20/14   Velta Addison Mikhail, DO  metroNIDAZOLE (FLAGYL) 500 MG tablet Take 1 tablet (500 mg total) by mouth 3 (three) times daily. Patient not taking: Reported on 01/05/2015 12/20/14   Cristal Ford, DO  oxyCODONE-acetaminophen (PERCOCET/ROXICET) 5-325 MG per tablet Take 1 tablet by mouth at bedtime as needed for moderate pain. 12/20/14   Maryann Mikhail, DO   BP 153/99 mmHg  Pulse 95  Temp(Src) 97.6 F (36.4 C) (Oral)  Resp 20  SpO2 98% Physical Exam Physical Exam  Nursing note and vitals reviewed. Constitutional: Well developed, well nourished, non-toxic, and in no acute distress Head: Normocephalic and atraumatic.  Mouth/Throat: Oropharynx is clear and dry.  Neck: Normal range of motion. Neck supple.  Cardiovascular: Normal rate and regular rhythm.  No edema Pulmonary/Chest: Effort normal and breath sounds normal.  Abdominal: Soft. Obese. Non-distended. There is no tenderness. There is no rebound and no guarding.  Musculoskeletal: Normal range of motion.   Neurological: Alert, no facial droop, fluent speech, moves all extremities symmetrically Skin: Skin is warm and dry.  Psychiatric: Cooperative  ED Course  Procedures (including critical care time) Labs Review Labs Reviewed  CBC WITH DIFFERENTIAL/PLATELET - Abnormal; Notable for the following:    WBC 3.8 (*)    RBC 3.33 (*)    Hemoglobin 10.1 (*)    HCT 30.5 (*)    All other components within normal limits  BASIC METABOLIC PANEL - Abnormal; Notable for the following:    Chloride 98 (*)    Creatinine, Ser 6.95 (*)    GFR calc non Af Amer 8 (*)    GFR calc Af Amer 9 (*)    All other components within normal limits  BRAIN NATRIURETIC PEPTIDE -  Abnormal; Notable for the following:    B Natriuretic Peptide 1555.2 (*)    All other components within normal limits  MAGNESIUM  HEPATIC FUNCTION PANEL  I-STAT TROPOININ, ED    Imaging Review Dg Chest 2 View  01/13/2015   CLINICAL DATA:  Shortness of breath 2-3 weeks  EXAM: CHEST  2 VIEW  COMPARISON:  01/05/2015  FINDINGS: Mild cardiomegaly noted with prominence of the central vascular pedicles. Mild prominence of the interstitial markings is noted with a few Kerley B-lines. Trace pleural effusions. No focal pulmonary opacity. No acute osseous abnormality. Flattening of the hemidiaphragms may suggest emphysema.  IMPRESSION: Cardiomegaly with probable mild interstitial edema and trace pleural effusions. No focal pulmonary opacity.   Electronically Signed   By: Conchita Paris M.D.   On: 01/13/2015 12:58   I have personally reviewed and evaluated these images and lab results as part of my medical decision-making.   EKG Interpretation   Date/Time:  Monday January 13 2015 11:46:49 EDT Ventricular Rate:  103 PR Interval:  176 QRS Duration: 106 QT Interval:  392 QTC Calculation: 513 R Axis:   -21 Text Interpretation:  Sinus tachycardia Left ventricular hypertrophy  Nonspecific T wave abnormality Abnormal ECG QT prolonged No significant   change when compared to prior EKG Confirmed by Lux Skilton MD, Hinton Dyer [id number]) on  01/13/2015 4:06:15 PM      MDM   Final diagnoses:  Acute pulmonary edema  Hypoxia    54 year old male with history of end-stage renal disease on hemodialysis and cardiomyopathy with EF of 25% who presents with progressive dyspnea on exertion, orthopnea, PND. He on presentation is nontoxic in no acute distress. He is mildly tachycardic with a heart rate between 100-105, but normotensive and in no respiratory distress. He is able to speak  in full sentences with normal work of breathing and normal oxygenation. However, with walking, he is noted to have significant dyspnea walking down the hallway of the emergency department. His pulse ox drops from 100% to 91% on room air. It takes him several minutes to recover.  Workup is notable for a chest x-ray showing persistent interstitial edema with pleural effusions and cardiomegaly, despite full dialysis today. Troponin is negative and EKG is not ischemic. Given his near hypoxia with symptoms of persistent fluid overload, he requires additional dialysis. He does not urinate, and is unlikely to respond to diuresis. He is discussed with [facility] hospitalist, and he will be admitted for ongoing management.   Forde Dandy, MD 01/13/15 [phone]

## 2015-01-13 NOTE — ED Notes (Signed)
Placed order for renal diet tray for pt.

## 2015-01-13 NOTE — ED Notes (Signed)
Attempted report x1. 

## 2015-01-14 ENCOUNTER — Inpatient Hospital Stay (HOSPITAL_COMMUNITY): Payer: Medicare Other

## 2015-01-14 DIAGNOSIS — R1011 Right upper quadrant pain: Secondary | ICD-10-CM

## 2015-01-14 DIAGNOSIS — N186 End stage renal disease: Secondary | ICD-10-CM

## 2015-01-14 DIAGNOSIS — J810 Acute pulmonary edema: Principal | ICD-10-CM

## 2015-01-14 DIAGNOSIS — I5022 Chronic systolic (congestive) heart failure: Secondary | ICD-10-CM

## 2015-01-14 LAB — TROPONIN I
Troponin I: 0.37 ng/mL — ABNORMAL HIGH (ref <0.031)
Troponin I: 0.4 ng/mL — ABNORMAL HIGH (ref <0.031)

## 2015-01-14 LAB — COMPREHENSIVE METABOLIC PANEL
ALT: 10 U/L — ABNORMAL LOW (ref 17–63)
AST: 15 U/L (ref 15–41)
Albumin: 3.2 g/dL — ABNORMAL LOW (ref 3.5–5.0)
Alkaline Phosphatase: 58 U/L (ref 38–126)
Anion gap: 10 (ref 5–15)
BUN: 18 mg/dL (ref 6–20)
CO2: 29 mmol/L (ref 22–32)
Calcium: 9.4 mg/dL (ref 8.9–10.3)
Chloride: 96 mmol/L — ABNORMAL LOW (ref 101–111)
Creatinine, Ser: 9.14 mg/dL — ABNORMAL HIGH (ref 0.61–1.24)
GFR calc Af Amer: 7 mL/min — ABNORMAL LOW (ref >60)
GFR calc non Af Amer: 6 mL/min — ABNORMAL LOW (ref >60)
Glucose, Bld: 152 mg/dL — ABNORMAL HIGH (ref 65–99)
Potassium: 3.7 mmol/L (ref 3.5–5.1)
Sodium: 135 mmol/L (ref 135–145)
Total Bilirubin: 0.9 mg/dL (ref 0.3–1.2)
Total Protein: 7.2 g/dL (ref 6.5–8.1)

## 2015-01-14 LAB — CBC
HCT: 30 % — ABNORMAL LOW (ref 39.0–52.0)
Hemoglobin: 9.7 g/dL — ABNORMAL LOW (ref 13.0–17.0)
MCH: 29.7 pg (ref 26.0–34.0)
MCHC: 32.3 g/dL (ref 30.0–36.0)
MCV: 91.7 fL (ref 78.0–100.0)
Platelets: 205 10*3/uL (ref 150–400)
RBC: 3.27 MIL/uL — ABNORMAL LOW (ref 4.22–5.81)
RDW: 14.8 % (ref 11.5–15.5)
WBC: 3.2 10*3/uL — ABNORMAL LOW (ref 4.0–10.5)

## 2015-01-14 LAB — LIPASE, BLOOD: Lipase: 22 U/L (ref 22–51)

## 2015-01-14 MED ORDER — NEPRO/CARBSTEADY PO LIQD
237.0000 mL | Freq: Two times a day (BID) | ORAL | Status: DC
  Administered 2015-01-14 – 2015-01-16 (×3): 237 mL via ORAL

## 2015-01-14 MED ORDER — PANTOPRAZOLE SODIUM 40 MG PO TBEC
40.0000 mg | DELAYED_RELEASE_TABLET | Freq: Every day | ORAL | Status: DC
  Administered 2015-01-14 – 2015-01-16 (×3): 40 mg via ORAL
  Filled 2015-01-14 (×4): qty 1

## 2015-01-14 MED ORDER — ZOLPIDEM TARTRATE 5 MG PO TABS
5.0000 mg | ORAL_TABLET | Freq: Every evening | ORAL | Status: DC | PRN
  Administered 2015-01-14 – 2015-01-15 (×2): 5 mg via ORAL
  Filled 2015-01-14 (×2): qty 1

## 2015-01-14 NOTE — Progress Notes (Signed)
  Echocardiogram 2D Echocardiogram has been performed.  [name] 01/14/2015, 4:55 PM

## 2015-01-14 NOTE — Consult Note (Signed)
[facility] Renal Consultation Note  Indication for Consultation:  Management of ESRD/hemodialysis; anemia, hypertension/volume and secondary hyperparathyroidism  HPI: [name] is a 54 y.o. male admitted with SOB from [facility] yesterday after  Full hd tx.and pulled below edw. Cxr showed interstitial   edema trace pl effusions. He denies chest pain ,or  fevers, chills. Does report some lower Right sided abdominal discomfort / decreased appetite  similar to recent ho with admit with diverticulitis 12/21/14 dc . He relates he's not able to lay flat due sob and dyspnea on exertion. His edw has been lowered at his op unit since his 12/21/14 dc. Currently post hd today with 3500 ccuf dyspnea improved .       Past Medical History  Diagnosis Date  . CHF (congestive heart failure)   . History of unilateral nephrectomy   . Hypertension   . Cardiomyopathy   . Gout   . Myocardial infarction ?2006  . Shortness of breath 05/19/11    "at rest, lying down, w/exertion"  . Umbilical hernia 38/10/17    unrepaired  . Stroke 02/2011    05/19/11 denies residual  . Dialysis patient     Mon-Wed-Fri  . Diabetes mellitus     pt. states he's borderline diabetic., no longer taking med,- off med. since 2013  . Renal cell carcinoma     dialysis- MWF  . ESRD (end stage renal disease)   . Coronary artery disease     normal coronaries by 10/10/08 cath  . History of nephrectomy 07/04/2012    History of right nephrectomy in 2002 for renal cell carcinoma   . 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 Nephrectomy  2000    right  . Av fistula placement  03/29/2011    Procedure: ARTERIOVENOUS (AV) FISTULA CREATION;  Surgeon: Hinda Lenis, MD;  Location: [place];  Service: Vascular;  Laterality: Left;  LEFT RADIOCEPHALIC , Arteriovenous [id number])  . Smashed  1990's    "left pinky; have a plate in there"  . US  echocardiography  05/20/11  . Cardiac catheterization  05/21/11  . Finger surgery      L pinkie finger- ORIF- /w remaining hardware - 1990's    . Umbilical hernia repair N/A 07/04/2012    Procedure: LAPAROSCOPIC UMBILICAL HERNIA;  Surgeon: Madilyn Hook, DO;  Location: [place];  Service: General;  Laterality: N/A;  . Insertion of mesh N/A 07/04/2012    Procedure: INSERTION OF MESH;  Surgeon: Madilyn Hook, DO;  Location: [place];  Service: General;  Laterality: N/A;  . Hernia repair N/A 09/14/5359    Umbilical hernia repair  . Dialysis cath placed    . Revison of arteriovenous fistula Left 06/05/2013    Procedure: REVISON OF LEFT RADIOCEPHALIC  ARTERIOVENOUS FISTULA;  Surgeon: Conrad Stanton, MD;  Location: [place];  Service: Vascular;  Laterality: Left;  . Right heart catheterization N/A 05/21/2011    Procedure: RIGHT HEART CATH;  Surgeon: Birdie Riddle, MD;  Location: [facility] CATH LAB;  Service: Cardiovascular;  Laterality: N/A;  . Left and right heart catheterization with coronary angiogram N/A 08/04/2012    Procedure: LEFT AND RIGHT HEART CATHETERIZATION WITH CORONARY ANGIOGRAM;  Surgeon: Birdie Riddle, MD;  Location: [place] CATH LAB;  Service: Cardiovascular;  Laterality: N/A;      Family History  Problem Relation Age of Onset  . Hypertension Mother   .  Kidney disease Mother       reports that he has never smoked. He has never used smokeless tobacco. He reports that he does not drink alcohol or use illicit drugs.   Allergies  Allergen Reactions  . Ace Inhibitors Cough    Prior to Admission medications   Medication Sig Start Date End Date Taking? Authorizing Provider  amLODipine (NORVASC) 10 MG tablet Take 10 mg by mouth daily after lunch.    Yes Historical Provider, MD  aspirin 325 MG tablet Take 325 mg by mouth daily.    Yes Historical Provider, MD  b complex-vitamin c-folic acid (NEPHRO-VITE) 0.8 MG TABS tablet Take 1 tablet by mouth at bedtime.   Yes Historical Provider, MD  calcium acetate  (PHOSLO) 667 MG capsule Take 667-2,001 mg by mouth 5 (five) times daily. Takes 2 capsules three times a day with meals.  Also takes 1-3 capsule with snacks, dose depends on what the snack is   Yes Historical Provider, MD  isosorbide dinitrate (ISORDIL) 20 MG tablet Take 20 mg by mouth daily.    Yes Historical Provider, MD  carvedilol (COREG) 25 MG tablet Take 12.5 mg by mouth 2 (two) times daily with a meal.    Historical Provider, MD  ciprofloxacin (CIPRO) 500 MG tablet Take 1 tablet (500 mg total) by mouth daily with breakfast. Patient not taking: Reported on 01/05/2015 12/20/14   Velta Addison Mikhail, DO  metroNIDAZOLE (FLAGYL) 500 MG tablet Take 1 tablet (500 mg total) by mouth 3 (three) times daily. Patient not taking: Reported on 01/05/2015 12/20/14   Cristal Ford, DO  oxyCODONE-acetaminophen (PERCOCET/ROXICET) 5-325 MG per tablet Take 1 tablet by mouth at bedtime as needed for moderate pain. 12/20/14   Maryann Mikhail, DO     Anti-infectives    None      Results for orders placed or performed during the hospital encounter of 01/13/15 (from the past 48 hour(s))  Magnesium     Status: None   Collection Time: 01/13/15  2:30 PM  Result Value Ref Range   Magnesium 2.1 1.7 - 2.4 mg/dL  Hepatic function panel     Status: Abnormal   Collection Time: 01/13/15  2:30 PM  Result Value Ref Range   Total Protein 7.5 6.5 - 8.1 g/dL   Albumin 3.3 (L) 3.5 - 5.0 g/dL   AST 15 15 - 41 U/L   ALT 8 (L) 17 - 63 U/L   Alkaline Phosphatase 59 38 - 126 U/L   Total Bilirubin 1.1 0.3 - 1.2 mg/dL   Bilirubin, Direct 0.1 0.1 - 0.5 mg/dL   Indirect Bilirubin 1.0 (H) 0.3 - 0.9 mg/dL  CBC with Differential     Status: Abnormal   Collection Time: 01/13/15  2:31 PM  Result Value Ref Range   WBC 3.8 (L) 4.0 - 10.5 K/uL   RBC 3.33 (L) 4.22 - 5.81 MIL/uL   Hemoglobin 10.1 (L) 13.0 - 17.0 g/dL   HCT 30.5 (L) 39.0 - 52.0 %   MCV 91.6 78.0 - 100.0 fL   MCH 30.3 26.0 - 34.0 pg   MCHC 33.1 30.0 - 36.0 g/dL   RDW 14.6  11.5 - 15.5 %   Platelets 197 150 - 400 K/uL   Neutrophils Relative % 56 43 - 77 %   Neutro Abs 2.1 1.7 - 7.7 K/uL   Lymphocytes Relative 32 12 - 46 %   Lymphs Abs 1.2 0.7 - 4.0 K/uL   Monocytes Relative 9 3 - 12 %  Monocytes Absolute 0.3 0.1 - 1.0 K/uL   Eosinophils Relative 4 0 - 5 %   Eosinophils Absolute 0.1 0.0 - 0.7 K/uL   Basophils Relative 1 0 - 1 %   Basophils Absolute 0.0 0.0 - 0.1 K/uL  Basic metabolic panel     Status: Abnormal   Collection Time: 01/13/15  2:31 PM  Result Value Ref Range   Sodium 140 135 - 145 mmol/L   Potassium 3.6 3.5 - 5.1 mmol/L   Chloride 98 (L) 101 - 111 mmol/L   CO2 32 22 - 32 mmol/L   Glucose, Bld 81 65 - 99 mg/dL   BUN 9 6 - 20 mg/dL   Creatinine, Ser 6.95 (H) 0.61 - 1.24 mg/dL   Calcium 9.3 8.9 - 10.3 mg/dL   GFR calc non Af Amer 8 (L) >60 mL/min   GFR calc Af Amer 9 (L) >60 mL/min    Comment: (NOTE) The eGFR has been calculated using the CKD EPI equation. This calculation has not been validated in all clinical situations. eGFR's persistently <60 mL/min signify possible Chronic Kidney Disease.    Anion gap 10 5 - 15  Brain natriuretic peptide     Status: Abnormal   Collection Time: 01/13/15  2:31 PM  Result Value Ref Range   B Natriuretic Peptide 1555.2 (H) 0.0 - 100.0 pg/mL  I-Stat Troponin, ED (not at [facility])     Status: None   Collection Time: 01/13/15  2:40 PM  Result Value Ref Range   Troponin i, poc 0.06 0.00 - 0.08 ng/mL   Comment 3            Comment: Due to the release kinetics of cTnI, a negative result within the first hours of the onset of symptoms does not rule out myocardial infarction with certainty. If myocardial infarction is still suspected, repeat the test at appropriate intervals.   Renal function panel     Status: Abnormal   Collection Time: 01/13/15  9:41 PM  Result Value Ref Range   Sodium 137 135 - 145 mmol/L   Potassium 4.0 3.5 - 5.1 mmol/L   Chloride 96 (L) 101 - 111 mmol/L   CO2 30 22 - 32 mmol/L    Glucose, Bld 121 (H) 65 - 99 mg/dL   BUN 13 6 - 20 mg/dL   Creatinine, Ser 7.79 (H) 0.61 - 1.24 mg/dL   Calcium 9.2 8.9 - 10.3 mg/dL   Phosphorus 2.8 2.5 - 4.6 mg/dL   Albumin 3.1 (L) 3.5 - 5.0 g/dL   GFR calc non Af Amer 7 (L) >60 mL/min   GFR calc Af Amer 8 (L) >60 mL/min    Comment: (NOTE) The eGFR has been calculated using the CKD EPI equation. This calculation has not been validated in all clinical situations. eGFR's persistently <60 mL/min signify possible Chronic Kidney Disease.    Anion gap 11 5 - 15  CBC     Status: Abnormal   Collection Time: 01/13/15  9:41 PM  Result Value Ref Range   WBC 4.2 4.0 - 10.5 K/uL   RBC 3.18 (L) 4.22 - 5.81 MIL/uL   Hemoglobin 9.6 (L) 13.0 - 17.0 g/dL   HCT 29.0 (L) 39.0 - 52.0 %   MCV 91.2 78.0 - 100.0 fL   MCH 30.2 26.0 - 34.0 pg   MCHC 33.1 30.0 - 36.0 g/dL   RDW 14.6 11.5 - 15.5 %   Platelets 212 150 - 400 K/uL  Creatinine, serum  Status: Abnormal   Collection Time: 01/13/15  9:41 PM  Result Value Ref Range   Creatinine, Ser 8.05 (H) 0.61 - 1.24 mg/dL   GFR calc non Af Amer 7 (L) >60 mL/min   GFR calc Af Amer 8 (L) >60 mL/min    Comment: (NOTE) The eGFR has been calculated using the CKD EPI equation. This calculation has not been validated in all clinical situations. eGFR's persistently <60 mL/min signify possible Chronic Kidney Disease.   Troponin I     Status: Abnormal   Collection Time: 01/13/15  9:41 PM  Result Value Ref Range   Troponin I 0.41 (H) <0.031 ng/mL    Comment:        PERSISTENTLY INCREASED TROPONIN VALUES IN THE RANGE OF 0.04-0.49 ng/mL CAN BE SEEN IN:       -UNSTABLE ANGINA       -CONGESTIVE HEART FAILURE       -MYOCARDITIS       -CHEST TRAUMA       -ARRYHTHMIAS       -LATE PRESENTING MYOCARDIAL INFARCTION       -COPD   CLINICAL FOLLOW-UP RECOMMENDED.   Troponin I     Status: Abnormal   Collection Time: 01/14/15 12:30 AM  Result Value Ref Range   Troponin I 0.37 (H) <0.031 ng/mL    Comment:         PERSISTENTLY INCREASED TROPONIN VALUES IN THE RANGE OF 0.04-0.49 ng/mL CAN BE SEEN IN:       -UNSTABLE ANGINA       -CONGESTIVE HEART FAILURE       -MYOCARDITIS       -CHEST TRAUMA       -ARRYHTHMIAS       -LATE PRESENTING MYOCARDIAL INFARCTION       -COPD   CLINICAL FOLLOW-UP RECOMMENDED.   Troponin I     Status: Abnormal   Collection Time: 01/14/15  7:03 AM  Result Value Ref Range   Troponin I 0.40 (H) <0.031 ng/mL    Comment:        PERSISTENTLY INCREASED TROPONIN VALUES IN THE RANGE OF 0.04-0.49 ng/mL CAN BE SEEN IN:       -UNSTABLE ANGINA       -CONGESTIVE HEART FAILURE       -MYOCARDITIS       -CHEST TRAUMA       -ARRYHTHMIAS       -LATE PRESENTING MYOCARDIAL INFARCTION       -COPD   CLINICAL FOLLOW-UP RECOMMENDED.   CBC     Status: Abnormal   Collection Time: 01/14/15  7:03 AM  Result Value Ref Range   WBC 3.2 (L) 4.0 - 10.5 K/uL   RBC 3.27 (L) 4.22 - 5.81 MIL/uL   Hemoglobin 9.7 (L) 13.0 - 17.0 g/dL   HCT 30.0 (L) 39.0 - 52.0 %   MCV 91.7 78.0 - 100.0 fL   MCH 29.7 26.0 - 34.0 pg   MCHC 32.3 30.0 - 36.0 g/dL   RDW 14.8 11.5 - 15.5 %   Platelets 205 150 - 400 K/uL  Comprehensive metabolic panel     Status: Abnormal   Collection Time: 01/14/15  7:03 AM  Result Value Ref Range   Sodium 135 135 - 145 mmol/L   Potassium 3.7 3.5 - 5.1 mmol/L   Chloride 96 (L) 101 - 111 mmol/L   CO2 29 22 - 32 mmol/L   Glucose, Bld 152 (H) 65 - 99 mg/dL   BUN 18 6 -  20 mg/dL   Creatinine, Ser 9.14 (H) 0.61 - 1.24 mg/dL   Calcium 9.4 8.9 - 10.3 mg/dL   Total Protein 7.2 6.5 - 8.1 g/dL   Albumin 3.2 (L) 3.5 - 5.0 g/dL   AST 15 15 - 41 U/L   ALT 10 (L) 17 - 63 U/L   Alkaline Phosphatase 58 38 - 126 U/L   Total Bilirubin 0.9 0.3 - 1.2 mg/dL   GFR calc non Af Amer 6 (L) >60 mL/min   GFR calc Af Amer 7 (L) >60 mL/min    Comment: (NOTE) The eGFR has been calculated using the CKD EPI equation. This calculation has not been validated in all clinical situations. eGFR's  persistently <60 mL/min signify possible Chronic Kidney Disease.    Anion gap 10 5 - 15     ROS: as hpi  Physical Exam: Filed Vitals:   01/14/15 1438  BP: 101/64  Pulse: 83  Temp:   Resp:      General:alert  Obese BM NAD OX3 appropriate  HEENT: [place] MMM, [place] Neck: supple no jvd Heart: RRR 1/6 sem lsb , no rub or gallop Lungs: CTA nonlabored  Abdomen: obese, bs pos. Soft NT, ND Extremities:  No pedal edema  Skin: no overt rash Neuro: ox3 No acute focal deficits  Dialysis Access: pos bruit L fa avf  Dialysis Orders: Center: [facility]  on MWF . EDW 116 HD Bath 2k, 2.25ca  Time 4Heparin 12000 initial  Then 4000 intermittent. Access L FA AVF       Mircera 7mcg   hec 9mcg     Other op lab hgb 9.4 ca 9.4 phos 5.0  pth 474  Assessment/Plan 1. Pulm edema / vol overload  With ho dcr appetie/ diverticlur dz - hd in am on schedule / ho 30 %ef ,CE 2. ESRD -  HD MWF lower edw at dc  3. Hypertension/volume  - 101/64 / taper bp meds for vol removal on hd  4. Anemia  - esa  Fu hgb  5. Metabolic bone disease -  [place] / vit d 6.  R Upper Abd pain /Recent Diverticulitis admit - per admit  abd [place] pend. 7. DM type 2    Ernest Haber, PA-C [place] [phone] 01/14/2015, 3:29 PM   Pt seen, examined and agree w A/P as above.  ESRD patient with SOB and pulm edema, better w HD today. May have lost body weight. Will follow. HD tomorrow as well.  Kelly Splinter MD pager [phone]    cell [phone] 01/14/2015, 5:56 PM

## 2015-01-14 NOTE — Progress Notes (Signed)
Patient alert and oriented x4. Patient oriented to unit, staff and room. Patient denies having pain. Skin assessment completed with two RN's. No skin issues noted. IV clean, dry and intact. Safety Fall Prevention Plan was given, discussed and signed by patient. Orders have been reviewed and implemented. Call light has been placed within reach. RN will continue to monitor the patient.   Nena Polio BSN, RN  Phone Number: [phone]

## 2015-01-14 NOTE — Progress Notes (Signed)
[facility] PROGRESS NOTE  [name] [id number] DOB: Mar 24, 1961 DOA: 01/13/2015 PCP: Maggie Font, MD   Brief narrative 54 year old male with history of end-stage renal disease on dialysis (M, W,F) type 2 diabetes mellitus, systolic CHF with EF of [id number] and diverticulitis presented to the hospital with ongoing dyspnea on exertion for past 3 weeks. Although he has adequate volume removal with dialysis his symptoms have not improved and has progressive dyspnea on exertion. On presentation his BNP was 1500, chest x-ray showing interstitial pulmonary patterns. A CT angiogram of the chest done one week back for shortness of breath was negative for central, lobar or proximal segmental sized pulmonary embolus. Study was limited due to respiratory motion. Patient admitted for acute pulmonary edema.  Assessment/Plan: Acute hypoxic respiratory failure secondary to pulmonary edema Patient feels  better after hemodialysis today but still not back to his baseline. Denies any chest pain or discomfort. He does have mildly elevated troponin but given his end-stage renal disease and absence of EKG changes or chest pain symptoms -Will obtain a 2-D echo.. Patient also had negative stress test about one year back.  Right upper quadrant pain Patient does have a right upper quadrant tenderness on exam. Ultrasound of the abdomen shows a left hepatic lobe cyst without gallstones or thickening. No Murphy sign noted. The liver is normal. Check lipase. She also reports having reflux symptoms. Will add daily Protonix.  Chronic systolic CHF EF of 25 and 30%. Continue monitor with dialysis. Given his ongoing dyspnea on exertion even with adequate dialysis I will check a 2-D echo. Consult his cardiologist (Dr. Doylene Canard) if needed. Continue Coreg, Imdur and aspirin.  Elevated troponin Possibly demand ischemia with volume overload. Chest pain symptoms. Troponin peaked at 0.4. Check 2-D echo.  Type 2 diabetes  mellitus Listed on problem list but not on any medications check A1c.  End-stage renal disease Had dialysis today. Renal following. Possibly will have dialysis tomorrow as well.  Essential hypertension Continue amlodipine   Diet: Renal/heart healthy  Code Status: Full code Family Communication: None at bedside Disposition Plan: Home once clinically improved. Possibly in the next 24 at 48 hours   Consultants:  Renal  Procedures:  Scheduled dialysis  Antibiotics:  None  HPI/Subjective: Patient seen and examined after returning from dialysis. Reports his dyspnea to be somewhat better but not back to normal.  Objective: Filed Vitals:   01/14/15 1438  BP: 101/64  Pulse: 83  Temp:   Resp:     Intake/Output Summary (Last 24 hours) at 01/14/15 1521 Last data filed at 01/14/15 1107  Gross per 24 hour  Intake    480 ml  Output   3532 ml  Net  -3052 ml   Filed Weights   01/13/15 1853 01/14/15 0655 01/14/15 1107  Weight: 115.849 kg (255 lb 6.4 oz) 116.2 kg (256 lb 2.8 oz) 112.7 kg (248 lb 7.3 oz)    Exam:   General:  Middle aged obese male in no acute distress  HEENT: No pallor, moist oral mucosa, supple neck, no JVD  Chest: Clear to auscultation bilaterally  CVS: Normal S1 and S2, no murmurs rub or gallop  GI: Soft, nondistended, bowel sounds present, right upper quadrant tenderness to deep palpation  Musculoskeletal: Warm, no edema  CNS: Alert and oriented   Data Reviewed: Basic Metabolic Panel:  Recent Labs Lab 01/13/15 1430 01/13/15 1431 01/13/15 2141 01/14/15 0703  NA  --  140 137 135  K  --  3.6 4.0 3.7  CL  --  98* 96* 96*  CO2  --  32 30 29  GLUCOSE  --  81 121* 152*  BUN  --  9 13 18   CREATININE  --  6.95* 8.05*  7.79* 9.14*  CALCIUM  --  9.3 9.2 9.4  MG 2.1  --   --   --   PHOS  --   --  2.8  --    Liver Function Tests:  Recent Labs Lab 01/13/15 1430 01/13/15 2141 01/14/15 0703  AST 15  --  15  ALT 8*  --  10*  ALKPHOS  59  --  58  BILITOT 1.1  --  0.9  PROT 7.5  --  7.2  ALBUMIN 3.3* 3.1* 3.2*   No results for input(s): LIPASE, AMYLASE in the last 168 hours. No results for input(s): AMMONIA in the last 168 hours. CBC:  Recent Labs Lab 01/13/15 1431 01/13/15 2141 01/14/15 0703  WBC 3.8* 4.2 3.2*  NEUTROABS 2.1  --   --   HGB 10.1* 9.6* 9.7*  HCT 30.5* 29.0* 30.0*  MCV 91.6 91.2 91.7  PLT 197 212 205   Cardiac Enzymes:  Recent Labs Lab 01/13/15 2141 01/14/15 0030 01/14/15 0703  TROPONINI 0.41* 0.37* 0.40*   BNP (last 3 results)  Recent Labs  01/05/15 0738 01/13/15 1431  BNP 1205.8* 1555.2*    ProBNP (last 3 results) No results for input(s): PROBNP in the last 8760 hours.  CBG: No results for input(s): GLUCAP in the last 168 hours.  No results found for this or any previous visit (from the past 240 hour(s)).   Studies: Dg Chest 2 View  01/13/2015   CLINICAL DATA:  Shortness of breath 2-3 weeks  EXAM: CHEST  2 VIEW  COMPARISON:  01/05/2015  FINDINGS: Mild cardiomegaly noted with prominence of the central vascular pedicles. Mild prominence of the interstitial markings is noted with a few Kerley B-lines. Trace pleural effusions. No focal pulmonary opacity. No acute osseous abnormality. Flattening of the hemidiaphragms may suggest emphysema.  IMPRESSION: Cardiomegaly with probable mild interstitial edema and trace pleural effusions. No focal pulmonary opacity.   Electronically Signed   By: Conchita Paris M.D.   On: 01/13/2015 12:58   US Abdomen Complete  01/13/2015   CLINICAL DATA:  Right upper quadrant abdominal pain for 3 weeks.  EXAM: ULTRASOUND ABDOMEN COMPLETE  COMPARISON:  CT scan of December 18, 2014.  FINDINGS: Gallbladder: No gallstones or wall thickening visualized. No sonographic Murphy sign noted.  Common bile duct: Diameter: 4.2 mm which is within normal limits.  Liver: 12 mm low density is noted in left hepatic lobe consistent with simple cyst; this corresponds to abnormality  seen on prior CT scan. Within normal limits in parenchymal echogenicity.  IVC: No abnormality visualized.  Pancreas: Visualized portion unremarkable.  Spleen: Size and appearance within normal limits.  Status post right nephrectomy.  Left Kidney: Length: 8.3 cm. Increased echogenicity is noted of renal parenchymal. No mass or hydronephrosis visualized.  Abdominal aorta: No aneurysm visualized.  Other findings: Small right pleural effusion.  IMPRESSION: Small left hepatic cyst. Status post right nephrectomy. Left renal atrophy. No acute abnormality seen in the abdomen.   Electronically Signed   By: Marijo Conception, M.D.   On: 01/13/2015 18:36    Scheduled Meds: . amLODipine  10 mg Oral QPC lunch  . aspirin  325 mg Oral Daily  . calcium acetate  1,334 mg Oral TID WC  . calcium acetate  667-2,001 mg Oral With snacks  . carvedilol  12.5 mg Oral BID WC  . feeding supplement (NEPRO CARB STEADY)  237 mL Oral BID BM  . heparin  5,000 Units Subcutaneous 3 times per day  . isosorbide dinitrate  20 mg Oral Daily  . multivitamin  1 tablet Oral QHS  . pantoprazole  40 mg Oral Q1200  . sodium chloride  3 mL Intravenous Q12H   Continuous Infusions:     Time spent: 25 minutes    Lanson Randle, [place]  [facility] Pager [phone]. If 7PM-7AM, please contact night-coverage at [email or web address], password [facility] 01/14/2015, 3:21 PM  LOS: 1 day

## 2015-01-14 NOTE — Progress Notes (Signed)
Initial Nutrition Assessment  DOCUMENTATION CODES:   Obesity unspecified  INTERVENTION:   Provide Nepro Shake po BID, each supplement provides 425 kcal and 19 grams protein.  Provide nourishment snacks. (Ordered)  Encourage adequate PO intake.   NUTRITION DIAGNOSIS:   Inadequate oral intake related to poor appetite as evidenced by meal completion < 25%.  GOAL:   Patient will meet greater than or equal to 90% of their needs  MONITOR:   PO intake, Supplement acceptance, Weight trends, Labs, I & O's  REASON FOR ASSESSMENT:   Malnutrition Screening Tool    ASSESSMENT:   54 y.o. male with past medical history of diverticulitis, diabetes mellitus type 2, systolic heart failure with end-stage renal disease who dialyzes Monday Wednesday Friday who presents to the hospital because of dyspnea on exertion between going on for 3 weeks progressively getting worse. Pt with pulomonary edema.  Meal completion 0%. Pt reports having a decreased appetite which has been ongoing over the past 1 month. Pt reports he has been able to consume mostly soups as he has been suffering from n/v. Weight has been fairly stable with no significant weight loss. Pt is agreeable to Nepro Shake to aid in caloric and protein needs. Pt additionally requests nourishment snacks. RD to order.   Pt with no observed significant fat or muscle mass loss.   Labs: Low chloride, ALT, and GFR. High creatinine.  Potassium and phosphorous WNL.   Diet Order:  Diet renal with fluid restriction Fluid restriction:: 1200 mL Fluid; Room service appropriate?: Yes; Fluid consistency:: Thin  Skin:  Reviewed, no issues  Last BM:  8/22  Height:   Ht Readings from Last 1 Encounters:  01/13/15 6\' 1"  (1.854 m)    Weight:   Wt Readings from Last 1 Encounters:  01/14/15 248 lb 7.3 oz (112.7 kg)    Ideal Body Weight:  83.6 kg  BMI:  Body mass index is 32.79 kg/(m^2).  Estimated Nutritional Needs:   Kcal:   2200-2400  Protein:  115-135 grams  Fluid:  1.2 L/day  EDUCATION NEEDS:   No education needs identified at this time  Corrin Parker, MS, RD, LDN Pager # [phone] After hours/ weekend pager # [phone]

## 2015-01-15 ENCOUNTER — Inpatient Hospital Stay (HOSPITAL_COMMUNITY): Payer: Medicare Other

## 2015-01-15 LAB — CBC WITH DIFFERENTIAL/PLATELET
Basophils Absolute: 0 10*3/uL (ref 0.0–0.1)
Basophils Relative: 0 % (ref 0–1)
Eosinophils Absolute: 0.2 10*3/uL (ref 0.0–0.7)
Eosinophils Relative: 4 % (ref 0–5)
HCT: 29.8 % — ABNORMAL LOW (ref 39.0–52.0)
Hemoglobin: 9.9 g/dL — ABNORMAL LOW (ref 13.0–17.0)
Lymphocytes Relative: 31 % (ref 12–46)
Lymphs Abs: 1.3 10*3/uL (ref 0.7–4.0)
MCH: 30 pg (ref 26.0–34.0)
MCHC: 33.2 g/dL (ref 30.0–36.0)
MCV: 90.3 fL (ref 78.0–100.0)
Monocytes Absolute: 0.4 10*3/uL (ref 0.1–1.0)
Monocytes Relative: 10 % (ref 3–12)
Neutro Abs: 2.3 10*3/uL (ref 1.7–7.7)
Neutrophils Relative %: 55 % (ref 43–77)
Platelets: 181 10*3/uL (ref 150–400)
RBC: 3.3 MIL/uL — ABNORMAL LOW (ref 4.22–5.81)
RDW: 14.6 % (ref 11.5–15.5)
WBC: 4.1 10*3/uL (ref 4.0–10.5)

## 2015-01-15 LAB — RENAL FUNCTION PANEL
Albumin: 3.2 g/dL — ABNORMAL LOW (ref 3.5–5.0)
Anion gap: 10 (ref 5–15)
BUN: 28 mg/dL — ABNORMAL HIGH (ref 6–20)
CO2: 28 mmol/L (ref 22–32)
Calcium: 9.6 mg/dL (ref 8.9–10.3)
Chloride: 96 mmol/L — ABNORMAL LOW (ref 101–111)
Creatinine, Ser: 8.57 mg/dL — ABNORMAL HIGH (ref 0.61–1.24)
GFR calc Af Amer: 7 mL/min — ABNORMAL LOW (ref >60)
GFR calc non Af Amer: 6 mL/min — ABNORMAL LOW (ref >60)
Glucose, Bld: 111 mg/dL — ABNORMAL HIGH (ref 65–99)
Phosphorus: 4 mg/dL (ref 2.5–4.6)
Potassium: 4.2 mmol/L (ref 3.5–5.1)
Sodium: 134 mmol/L — ABNORMAL LOW (ref 135–145)

## 2015-01-15 LAB — HEMOGLOBIN A1C
Hgb A1c MFr Bld: 5.4 % (ref 4.8–5.6)
Mean Plasma Glucose: 108 mg/dL

## 2015-01-15 MED ORDER — CALCIUM ACETATE 667 MG PO CAPS: 667.0000 mg | ORAL_CAPSULE | Freq: Three times a day (TID) | ORAL | Status: DC

## 2015-01-15 MED ORDER — CALCIUM ACETATE (PHOS BINDER) 667 MG PO CAPS
667.0000 mg | ORAL_CAPSULE | Freq: Three times a day (TID) | ORAL | Status: DC
  Administered 2015-01-16: 667 mg via ORAL
  Filled 2015-01-15 (×2): qty 1

## 2015-01-15 NOTE — Progress Notes (Signed)
Subjective:  On hd no cos   Objective Vital signs in last 24 hours: Filed Vitals:   01/15/15 1000 01/15/15 1030 01/15/15 1100 01/15/15 1130  BP: 119/84 126/86 119/85 117/80  Pulse: 82 82 82 76  Temp:      TempSrc:      Resp: 16 16 16 16   Height:      Weight:      SpO2:       Weight change: -3.149 kg (-6 lb 15.1 oz)  Physical Exam: General:alert Obese, NAD OX3 appropriate  Heart: RRR 1/6 sem lsb , no rub or gallop Lungs: CTA nonlabored  Abdomen: obese, bs pos. Soft NT, ND Extremities: No pedal edema  Dialysis Access: patent on HD L fa avf  Dialysis Orders: Center: [facility] on MWF . EDW 116 HD Bath 2k, 2.25ca Time 4Heparin 12000 initial Then 4000 intermittent. Access L FA AVF  Mircera 32mcg hec 21mcg  Other op lab hgb 9.4 ca 9.4 phos 5.0 pth 474  Problem/Plan: 1. Pulm edema / vol overload/ noted CM on 2decho with CM ef 25-30 %  And severe MR and With ho recent  decr appetie/ diverticlur dz - hd for vol removal past 2 days /  Fu cxr post hd  And Card Dr. Doylene Canard to see/ 2. ESRD - HD MWF lower edw at dc  3. Hypertension/volume - 101/64 / taper bp meds for vol removal on hd  4. Anemia - esa Fu hgb  5. Metabolic bone disease -   phos stable  Ac sl ^ /Binder  decr binders in hosp/ vit d 6. R Upper Abd pain /Recent Diverticulitis admit - per admit eval /  abd [place]  = nad  7. DM type 2   Ernest Haber, PA-C [facility] Beeper [phone] 01/15/2015,12:21 PM  LOS: 2 days   Pt seen, examined and agree w A/P as above.  Kelly Splinter MD pager [phone]    cell [phone] 01/15/2015, 1:53 PM    Labs: Basic Metabolic Panel:  Recent Labs Lab 01/13/15 2141 01/14/15 0703 01/15/15 0900  NA 137 135 134*  K 4.0 3.7 4.2  CL 96* 96* 96*  CO2 30 29 28   GLUCOSE 121* 152* 111*  BUN 13 18 28*  CREATININE 8.05*  7.79* 9.14* 8.57*  CALCIUM 9.2 9.4 9.6  PHOS 2.8  --  4.0   Liver Function Tests:  Recent Labs Lab 01/13/15 1430 01/13/15 2141  01/14/15 0703 01/15/15 0900  AST 15  --  15  --   ALT 8*  --  10*  --   ALKPHOS 59  --  58  --   BILITOT 1.1  --  0.9  --   PROT 7.5  --  7.2  --   ALBUMIN 3.3* 3.1* 3.2* 3.2*    Recent Labs Lab 01/14/15 1613  LIPASE 22   No results for input(s): AMMONIA in the last 168 hours. CBC:  Recent Labs Lab 01/13/15 1431 01/13/15 2141 01/14/15 0703 01/15/15 0859  WBC 3.8* 4.2 3.2* 4.1  NEUTROABS 2.1  --   --  2.3  HGB 10.1* 9.6* 9.7* 9.9*  HCT 30.5* 29.0* 30.0* 29.8*  MCV 91.6 91.2 91.7 90.3  PLT 197 212 205 181   Cardiac Enzymes:  Recent Labs Lab 01/13/15 2141 01/14/15 0030 01/14/15 0703  TROPONINI 0.41* 0.37* 0.40*   CBG: No results for input(s): GLUCAP in the last 168 hours.  Studies/Results: Dg Chest 2 View  01/13/2015   CLINICAL DATA:  Shortness  of breath 2-3 weeks  EXAM: CHEST  2 VIEW  COMPARISON:  01/05/2015  FINDINGS: Mild cardiomegaly noted with prominence of the central vascular pedicles. Mild prominence of the interstitial markings is noted with a few Kerley B-lines. Trace pleural effusions. No focal pulmonary opacity. No acute osseous abnormality. Flattening of the hemidiaphragms may suggest emphysema.  IMPRESSION: Cardiomegaly with probable mild interstitial edema and trace pleural effusions. No focal pulmonary opacity.   Electronically Signed   By: Conchita Paris M.D.   On: 01/13/2015 12:58   US Abdomen Complete  01/13/2015   CLINICAL DATA:  Right upper quadrant abdominal pain for 3 weeks.  EXAM: ULTRASOUND ABDOMEN COMPLETE  COMPARISON:  CT scan of December 18, 2014.  FINDINGS: Gallbladder: No gallstones or wall thickening visualized. No sonographic Murphy sign noted.  Common bile duct: Diameter: 4.2 mm which is within normal limits.  Liver: 12 mm low density is noted in left hepatic lobe consistent with simple cyst; this corresponds to abnormality seen on prior CT scan. Within normal limits in parenchymal echogenicity.  IVC: No abnormality visualized.  Pancreas:  Visualized portion unremarkable.  Spleen: Size and appearance within normal limits.  Status post right nephrectomy.  Left Kidney: Length: 8.3 cm. Increased echogenicity is noted of renal parenchymal. No mass or hydronephrosis visualized.  Abdominal aorta: No aneurysm visualized.  Other findings: Small right pleural effusion.  IMPRESSION: Small left hepatic cyst. Status post right nephrectomy. Left renal atrophy. No acute abnormality seen in the abdomen.   Electronically Signed   By: Marijo Conception, M.D.   On: 01/13/2015 18:36   Medications:   . amLODipine  10 mg Oral QPC lunch  . aspirin  325 mg Oral Daily  . calcium acetate  1,334 mg Oral TID WC  . calcium acetate  667-2,001 mg Oral With snacks  . carvedilol  12.5 mg Oral BID WC  . feeding supplement (NEPRO CARB STEADY)  237 mL Oral BID BM  . heparin  5,000 Units Subcutaneous 3 times per day  . isosorbide dinitrate  20 mg Oral Daily  . multivitamin  1 tablet Oral QHS  . pantoprazole  40 mg Oral Q1200  . sodium chloride  3 mL Intravenous Q12H

## 2015-01-15 NOTE — Consult Note (Signed)
Referring Physician:  [name] is an 54 y.o. male.                       Chief Complaint: Exertional dyspnea  HPI: 54 year old male with history of end-stage renal disease on dialysis (M, W,F) type 2 diabetes mellitus, systolic CHF with EF of 01% and diverticulitis presented to the hospital with ongoing dyspnea on exertion for past 3 weeks. Echocardiogram shows dilated LV with 25-30 % EF and severe MR. Post extra dialysis treatment and fluid off feels better.  Past Medical History  Diagnosis Date  . CHF (congestive heart failure)   . History of unilateral nephrectomy   . Hypertension   . Cardiomyopathy   . Gout   . Myocardial infarction ?2006  . Shortness of breath 05/19/11    "at rest, lying down, w/exertion"  . Umbilical hernia 31/43/88    unrepaired  . Stroke 02/2011    05/19/11 denies residual  . Dialysis patient     Mon-Wed-Fri  . Diabetes mellitus     pt. states he's borderline diabetic., no longer taking med,- off med. since 2013  . Renal cell carcinoma     dialysis- MWF  . ESRD (end stage renal disease)   . Coronary artery disease     normal coronaries by 10/10/08 cath  . History of nephrectomy 07/04/2012    History of right nephrectomy in 2002 for renal cell carcinoma   . Blind right eye     Hx: of partial blindness in right eye      Past Surgical History  Procedure Laterality Date  . Nephrectomy  2000    right  . Av fistula placement  03/29/2011    Procedure: ARTERIOVENOUS (AV) FISTULA CREATION;  Surgeon: Hinda Lenis, MD;  Location: [place];  Service: Vascular;  Laterality: Left;  LEFT RADIOCEPHALIC , Arteriovenous [id number])  . Smashed  1990's    "left pinky; have a plate in there"  . US echocardiography  05/20/11  . Cardiac catheterization  05/21/11  . Finger surgery      L pinkie finger- ORIF- /w remaining hardware - 1990's    . Umbilical hernia repair N/A 07/04/2012    Procedure: LAPAROSCOPIC UMBILICAL HERNIA;  Surgeon: Madilyn Hook, DO;   Location: [place];  Service: General;  Laterality: N/A;  . Insertion of mesh N/A 07/04/2012    Procedure: INSERTION OF MESH;  Surgeon: Madilyn Hook, DO;  Location: [place];  Service: General;  Laterality: N/A;  . Hernia repair N/A 1/56/1537    Umbilical hernia repair  . Dialysis cath placed    . Revison of arteriovenous fistula Left 06/05/2013    Procedure: REVISON OF LEFT RADIOCEPHALIC  ARTERIOVENOUS FISTULA;  Surgeon: Conrad Henning, MD;  Location: [place];  Service: Vascular;  Laterality: Left;  . Right heart catheterization N/A 05/21/2011    Procedure: RIGHT HEART CATH;  Surgeon: Birdie Riddle, MD;  Location: [facility] CATH LAB;  Service: Cardiovascular;  Laterality: N/A;  . Left and right heart catheterization with coronary angiogram N/A 08/04/2012    Procedure: LEFT AND RIGHT HEART CATHETERIZATION WITH CORONARY ANGIOGRAM;  Surgeon: Birdie Riddle, MD;  Location: [place] CATH LAB;  Service: Cardiovascular;  Laterality: N/A;    Family History  Problem Relation Age of Onset  . Hypertension Mother   . Kidney disease Mother    Social History:  reports that he has never smoked. He has never used smokeless tobacco. He reports that he does not  drink alcohol or use illicit drugs.  Allergies:  Allergies  Allergen Reactions  . Ace Inhibitors Cough    Medications Prior to Admission  Medication Sig Dispense Refill  . amLODipine (NORVASC) 10 MG tablet Take 10 mg by mouth daily after lunch.     Marland Kitchen aspirin 325 MG tablet Take 325 mg by mouth daily.     Marland Kitchen b complex-vitamin c-folic acid (NEPHRO-VITE) 0.8 MG TABS tablet Take 1 tablet by mouth at bedtime.    . calcium acetate (PHOSLO) 667 MG capsule Take 667-2,001 mg by mouth 5 (five) times daily. Takes 2 capsules three times a day with meals.  Also takes 1-3 capsule with snacks, dose depends on what the snack is    . isosorbide dinitrate (ISORDIL) 20 MG tablet Take 20 mg by mouth daily.     . carvedilol (COREG) 25 MG tablet Take 12.5 mg by mouth 2 (two) times daily  with a meal.    . ciprofloxacin (CIPRO) 500 MG tablet Take 1 tablet (500 mg total) by mouth daily with breakfast. (Patient not taking: Reported on 01/05/2015) 8 tablet 0  . metroNIDAZOLE (FLAGYL) 500 MG tablet Take 1 tablet (500 mg total) by mouth 3 (three) times daily. (Patient not taking: Reported on 01/05/2015) 30 tablet 0  . oxyCODONE-acetaminophen (PERCOCET/ROXICET) 5-325 MG per tablet Take 1 tablet by mouth at bedtime as needed for moderate pain. 20 tablet 0    Results for orders placed or performed during the hospital encounter of 01/13/15 (from the past 48 hour(s))  Renal function panel     Status: Abnormal   Collection Time: 01/13/15  9:41 PM  Result Value Ref Range   Sodium 137 135 - 145 mmol/L   Potassium 4.0 3.5 - 5.1 mmol/L   Chloride 96 (L) 101 - 111 mmol/L   CO2 30 22 - 32 mmol/L   Glucose, Bld 121 (H) 65 - 99 mg/dL   BUN 13 6 - 20 mg/dL   Creatinine, Ser 7.79 (H) 0.61 - 1.24 mg/dL   Calcium 9.2 8.9 - 10.3 mg/dL   Phosphorus 2.8 2.5 - 4.6 mg/dL   Albumin 3.1 (L) 3.5 - 5.0 g/dL   GFR calc non Af Amer 7 (L) >60 mL/min   GFR calc Af Amer 8 (L) >60 mL/min    Comment: (NOTE) The eGFR has been calculated using the CKD EPI equation. This calculation has not been validated in all clinical situations. eGFR's persistently <60 mL/min signify possible Chronic Kidney Disease.    Anion gap 11 5 - 15  CBC     Status: Abnormal   Collection Time: 01/13/15  9:41 PM  Result Value Ref Range   WBC 4.2 4.0 - 10.5 K/uL   RBC 3.18 (L) 4.22 - 5.81 MIL/uL   Hemoglobin 9.6 (L) 13.0 - 17.0 g/dL   HCT 29.0 (L) 39.0 - 52.0 %   MCV 91.2 78.0 - 100.0 fL   MCH 30.2 26.0 - 34.0 pg   MCHC 33.1 30.0 - 36.0 g/dL   RDW 14.6 11.5 - 15.5 %   Platelets 212 150 - 400 K/uL  Creatinine, serum     Status: Abnormal   Collection Time: 01/13/15  9:41 PM  Result Value Ref Range   Creatinine, Ser 8.05 (H) 0.61 - 1.24 mg/dL   GFR calc non Af Amer 7 (L) >60 mL/min   GFR calc Af Amer 8 (L) >60 mL/min     Comment: (NOTE) The eGFR has been calculated using the CKD EPI equation.  This calculation has not been validated in all clinical situations. eGFR's persistently <60 mL/min signify possible Chronic Kidney Disease.   Troponin I     Status: Abnormal   Collection Time: 01/13/15  9:41 PM  Result Value Ref Range   Troponin I 0.41 (H) <0.031 ng/mL    Comment:        PERSISTENTLY INCREASED TROPONIN VALUES IN THE RANGE OF 0.04-0.49 ng/mL CAN BE SEEN IN:       -UNSTABLE ANGINA       -CONGESTIVE HEART FAILURE       -MYOCARDITIS       -CHEST TRAUMA       -ARRYHTHMIAS       -LATE PRESENTING MYOCARDIAL INFARCTION       -COPD   CLINICAL FOLLOW-UP RECOMMENDED.   Hemoglobin A1c     Status: None   Collection Time: 01/13/15  9:41 PM  Result Value Ref Range   Hgb A1c MFr Bld 5.4 4.8 - 5.6 %    Comment: (NOTE)         Pre-diabetes: 5.7 - 6.4         Diabetes: >6.4         Glycemic control for adults with diabetes: <7.0    Mean Plasma Glucose 108 mg/dL    Comment: (NOTE) Performed At: [facility] [place], [place] [id number] Lindon Romp MD DG:[id number]   Troponin I     Status: Abnormal   Collection Time: 01/14/15 12:30 AM  Result Value Ref Range   Troponin I 0.37 (H) <0.031 ng/mL    Comment:        PERSISTENTLY INCREASED TROPONIN VALUES IN THE RANGE OF 0.04-0.49 ng/mL CAN BE SEEN IN:       -UNSTABLE ANGINA       -CONGESTIVE HEART FAILURE       -MYOCARDITIS       -CHEST TRAUMA       -ARRYHTHMIAS       -LATE PRESENTING MYOCARDIAL INFARCTION       -COPD   CLINICAL FOLLOW-UP RECOMMENDED.   Troponin I     Status: Abnormal   Collection Time: 01/14/15  7:03 AM  Result Value Ref Range   Troponin I 0.40 (H) <0.031 ng/mL    Comment:        PERSISTENTLY INCREASED TROPONIN VALUES IN THE RANGE OF 0.04-0.49 ng/mL CAN BE SEEN IN:       -UNSTABLE ANGINA       -CONGESTIVE HEART FAILURE       -MYOCARDITIS       -CHEST TRAUMA       -ARRYHTHMIAS       -LATE  PRESENTING MYOCARDIAL INFARCTION       -COPD   CLINICAL FOLLOW-UP RECOMMENDED.   CBC     Status: Abnormal   Collection Time: 01/14/15  7:03 AM  Result Value Ref Range   WBC 3.2 (L) 4.0 - 10.5 K/uL   RBC 3.27 (L) 4.22 - 5.81 MIL/uL   Hemoglobin 9.7 (L) 13.0 - 17.0 g/dL   HCT 30.0 (L) 39.0 - 52.0 %   MCV 91.7 78.0 - 100.0 fL   MCH 29.7 26.0 - 34.0 pg   MCHC 32.3 30.0 - 36.0 g/dL   RDW 14.8 11.5 - 15.5 %   Platelets 205 150 - 400 K/uL  Comprehensive metabolic panel     Status: Abnormal   Collection Time: 01/14/15  7:03 AM  Result Value Ref Range   Sodium 135  135 - 145 mmol/L   Potassium 3.7 3.5 - 5.1 mmol/L   Chloride 96 (L) 101 - 111 mmol/L   CO2 29 22 - 32 mmol/L   Glucose, Bld 152 (H) 65 - 99 mg/dL   BUN 18 6 - 20 mg/dL   Creatinine, Ser 9.14 (H) 0.61 - 1.24 mg/dL   Calcium 9.4 8.9 - 10.3 mg/dL   Total Protein 7.2 6.5 - 8.1 g/dL   Albumin 3.2 (L) 3.5 - 5.0 g/dL   AST 15 15 - 41 U/L   ALT 10 (L) 17 - 63 U/L   Alkaline Phosphatase 58 38 - 126 U/L   Total Bilirubin 0.9 0.3 - 1.2 mg/dL   GFR calc non Af Amer 6 (L) >60 mL/min   GFR calc Af Amer 7 (L) >60 mL/min    Comment: (NOTE) The eGFR has been calculated using the CKD EPI equation. This calculation has not been validated in all clinical situations. eGFR's persistently <60 mL/min signify possible Chronic Kidney Disease.    Anion gap 10 5 - 15  Lipase, blood     Status: None   Collection Time: 01/14/15  4:13 PM  Result Value Ref Range   Lipase 22 22 - 51 U/L  CBC with Differential/Platelet     Status: Abnormal   Collection Time: 01/15/15  8:59 AM  Result Value Ref Range   WBC 4.1 4.0 - 10.5 K/uL   RBC 3.30 (L) 4.22 - 5.81 MIL/uL   Hemoglobin 9.9 (L) 13.0 - 17.0 g/dL   HCT 29.8 (L) 39.0 - 52.0 %   MCV 90.3 78.0 - 100.0 fL   MCH 30.0 26.0 - 34.0 pg   MCHC 33.2 30.0 - 36.0 g/dL   RDW 14.6 11.5 - 15.5 %   Platelets 181 150 - 400 K/uL   Neutrophils Relative % 55 43 - 77 %   Neutro Abs 2.3 1.7 - 7.7 K/uL    Lymphocytes Relative 31 12 - 46 %   Lymphs Abs 1.3 0.7 - 4.0 K/uL   Monocytes Relative 10 3 - 12 %   Monocytes Absolute 0.4 0.1 - 1.0 K/uL   Eosinophils Relative 4 0 - 5 %   Eosinophils Absolute 0.2 0.0 - 0.7 K/uL   Basophils Relative 0 0 - 1 %   Basophils Absolute 0.0 0.0 - 0.1 K/uL  Renal function panel     Status: Abnormal   Collection Time: 01/15/15  9:00 AM  Result Value Ref Range   Sodium 134 (L) 135 - 145 mmol/L   Potassium 4.2 3.5 - 5.1 mmol/L   Chloride 96 (L) 101 - 111 mmol/L   CO2 28 22 - 32 mmol/L   Glucose, Bld 111 (H) 65 - 99 mg/dL   BUN 28 (H) 6 - 20 mg/dL   Creatinine, Ser 8.57 (H) 0.61 - 1.24 mg/dL   Calcium 9.6 8.9 - 10.3 mg/dL   Phosphorus 4.0 2.5 - 4.6 mg/dL   Albumin 3.2 (L) 3.5 - 5.0 g/dL   GFR calc non Af Amer 6 (L) >60 mL/min   GFR calc Af Amer 7 (L) >60 mL/min    Comment: (NOTE) The eGFR has been calculated using the CKD EPI equation. This calculation has not been validated in all clinical situations. eGFR's persistently <60 mL/min signify possible Chronic Kidney Disease.    Anion gap 10 5 - 15   Dg Chest 2 View  01/15/2015   CLINICAL DATA:  Followup pulmonary edema question resolution, symptomatic improvement since dialysis, history  CHF, hypertension, diabetes mellitus, renal cancer, monoclonal stroke, cardiomyopathy  EXAM: CHEST  2 VIEW  COMPARISON:  01/13/2015  FINDINGS: Enlargement of cardiac silhouette with decreased vascular congestion versus prior exam.  Stable mediastinal contours.  Improved perihilar infiltrates likely improved edema.  Central peribronchial thickening remains.  Tiny bibasilar pleural effusions, decreased.  No pneumothorax or segmental consolidation.  Bones unremarkable.  IMPRESSION: Improved pulmonary edema.   Electronically Signed   By: Lavonia Dana M.D.   On: 01/15/2015 15:10    Review Of Systems Constitutional: Negative for fever, weight loss and fatigue.  HENT: Negative for congestion, sore throat, neck pain, neck stiffness  and sinus pressure.  Eyes: Negative for visual disturbance.  Respiratory: Positive for shortness of breath. Negative for apnea, cough, choking, chest tightness and wheezing.  Cardiovascular: Positive for leg swelling. Negative for chest pain and palpitations.  Gastrointestinal: Positive for nausea, abdominal pain and diarrhea. Negative for vomiting, melena, hematochezia and hematemesis.  Genitourinary: Positive for flank pain. Negative for dysuria.  Musculoskeletal: Negative for myalgias and arthralgias.  Skin: Negative for rash.  Neurological: Negative for facial asymmetry and weakness  Blood pressure 132/76, pulse 78, temperature 98.6 F (37 C), temperature source Oral, resp. rate 18, height $RemoveBe'6\' 1"'hWpdIKLfH$  (1.854 m), weight 114.1 kg (251 lb 8.7 oz), SpO2 100 %.  Constitutional: He is oriented to person, place, and time. He appears well-developed and well-nourished. Mild respiratory distress.  HENT: Normocephalic and atraumatic. Brown eyes, conj-pale pink, PERL, EOMI. No scleral icterus. Tongue pale pink and dry.  Neck: Normal range of motion. Neck supple. No tracheal deviation present. No thyromegaly present.  Cardiovascular: Normal rate, regular rhythm, normal heart sounds and intact distal pulses. II/VI systolic murmur. Pulmonary/Chest: Effort normal and breath sounds normal. He has no wheezes. He exhibits no tenderness.  Abdominal: Soft. Bowel sounds are normal.  Musculoskeletal: Normal range of motion. He exhibits trace edema and no tenderness.  Neurological: He is alert and oriented to person, place, and time. He has normal reflexes. Coordination normal.  Skin: Skin is warm and dry. No rash noted. He is not diaphoretic. No erythema.  Psychiatric: He has a normal mood and affect. His behavior is normal.   Assessment/Plan Acute on chronic left heart failure  ESRD with hemodialysis, MWF.  DM, II  Gout  Obesity CAD H/O Renal cell cancer S/P right nephrectomy  Nuclear  stress test in AM.  Birdie Riddle, MD  01/15/2015, 6:40 PM

## 2015-01-15 NOTE — Progress Notes (Signed)
Pharmacist Heart Failure Core Measure Documentation  Assessment: [name] has an EF documented as 25-30% on 01/14/15 by ECHO.  Rationale: Heart failure patients with left ventricular systolic dysfunction (LVSD) and an EF < 40% should be prescribed an angiotensin converting enzyme inhibitor (ACEI) or angiotensin receptor blocker (ARB) at discharge unless a contraindication is documented in the medical record.  This patient is not currently on an ACEI or ARB for HF.  This note is being placed in the record in order to provide documentation that a contraindication to the use of these agents is present for this encounter.  ACE Inhibitor or Angiotensin Receptor Blocker is contraindicated (specify all that apply)  []   ACEI allergy AND ARB allergy []   Angioedema []   Moderate or severe aortic stenosis []   Hyperkalemia []   Hypotension []   Renal artery stenosis [x]   Worsening renal function, preexisting renal disease or dysfunction   Lawson Radar 01/15/2015 11:51 AM

## 2015-01-15 NOTE — Progress Notes (Signed)
[facility] PROGRESS NOTE  [name] [id number] DOB: 1960-09-30 DOA: 01/13/2015 PCP: Maggie Font, MD   Brief narrative 54 year old male with history of end-stage renal disease on dialysis (M, W,F) type 2 diabetes mellitus, systolic CHF with EF of [id number] and diverticulitis presented to the hospital with ongoing dyspnea on exertion for past 3 weeks. Although he has adequate volume removal with dialysis his symptoms have not improved and has progressive dyspnea on exertion. On presentation his BNP was 1500, chest x-ray showing interstitial pulmonary patterns. A CT angiogram of the chest done one week back for shortness of breath was negative for central, lobar or proximal segmental sized pulmonary embolus. Study was limited due to respiratory motion. Patient admitted for acute pulmonary edema.  Assessment/Plan: Acute hypoxic respiratory failure secondary to pulmonary edema Patient feels  better after hemodialysis today but still not back to his baseline. Denies any chest pain or discomfort. He does have mildly elevated troponin but given his end-stage renal disease and absence of EKG changes or chest pain symptoms -2 d repeated echo with  EF of 25-30%% and worsened diffuse hypokinesis compared to previously.  Patient  had negative stress test about one year back. -d/w his cardiologist Dr Doylene Canard. He is deciding on cardiac cath as inpt  Right upper quadrant pain . Ultrasound of the abdomen shows a left hepatic lobe cyst without gallstones or thickening. No Murphy sign noted. The liver is normal. Check lipase normal. Has reflux symptoms suggestive fo GERD.  Symptoms better after adding protonix.  Chronic systolic CHF EF of 25 and 30% with increase diffuse hypokinesis. . Symptoms improved  with dialysis. D/w  his cardiologist (Dr. Doylene Canard) who is deciding on cardiac cath. Continue Coreg, Imdur and aspirin.  Elevated troponin Possibly demand ischemia with volume overload. Chest pain  symptoms. Troponin peaked at 0.4. Check 2-D echo.  Type 2 diabetes mellitus Listed on problem list but not on any medications.  A1c 5.4  End-stage renal disease HD roday. Renal following.   Essential hypertension Continue amlodipine   Diet: Renal/heart healthy  Code Status: Full code Family Communication: None at bedside Disposition Plan: Home pending cardiology evalaution   Consultants:  Renal  cardiology  Procedures:  Scheduled dialysis  2D echo  Antibiotics:  None  HPI/Subjective: Patient seen during  dialysis. dyspnea and RUQ pain better  Objective: Filed Vitals:   01/15/15 1426  BP: 127/79  Pulse:   Temp:   Resp:     Intake/Output Summary (Last 24 hours) at 01/15/15 1645 Last data filed at 01/15/15 1305  Gross per 24 hour  Intake      0 ml  Output   3000 ml  Net  -3000 ml   Filed Weights   01/14/15 1107 01/14/15 2036 01/15/15 0827  Weight: 112.7 kg (248 lb 7.3 oz) 115.577 kg (254 lb 12.8 oz) 114.1 kg (251 lb 8.7 oz)    Exam:   General:   no acute distress  HEENT: moist oral mucosa, supple neck,   Chest: Clear to auscultation bilaterally  CVS: Normal S1 and S2, no murmurs   GI: Soft, nondistended, bowel sounds present, no right upper quadrant tenderness  Musculoskeletal: Warm, no edema    Data Reviewed: Basic Metabolic Panel:  Recent Labs Lab 01/13/15 1430 01/13/15 1431 01/13/15 2141 01/14/15 0703 01/15/15 0900  NA  --  140 137 135 134*  K  --  3.6 4.0 3.7 4.2  CL  --  98* 96* 96* 96*  CO2  --  32  30 29 28   GLUCOSE  --  81 121* 152* 111*  BUN  --  9 13 18  28*  CREATININE  --  6.95* 8.05*  7.79* 9.14* 8.57*  CALCIUM  --  9.3 9.2 9.4 9.6  MG 2.1  --   --   --   --   PHOS  --   --  2.8  --  4.0   Liver Function Tests:  Recent Labs Lab 01/13/15 1430 01/13/15 2141 01/14/15 0703 01/15/15 0900  AST 15  --  15  --   ALT 8*  --  10*  --   ALKPHOS 59  --  58  --   BILITOT 1.1  --  0.9  --   PROT 7.5  --  7.2  --    ALBUMIN 3.3* 3.1* 3.2* 3.2*    Recent Labs Lab 01/14/15 1613  LIPASE 22   No results for input(s): AMMONIA in the last 168 hours. CBC:  Recent Labs Lab 01/13/15 1431 01/13/15 2141 01/14/15 0703 01/15/15 0859  WBC 3.8* 4.2 3.2* 4.1  NEUTROABS 2.1  --   --  2.3  HGB 10.1* 9.6* 9.7* 9.9*  HCT 30.5* 29.0* 30.0* 29.8*  MCV 91.6 91.2 91.7 90.3  PLT 197 212 205 181   Cardiac Enzymes:  Recent Labs Lab 01/13/15 2141 01/14/15 0030 01/14/15 0703  TROPONINI 0.41* 0.37* 0.40*   BNP (last 3 results)  Recent Labs  01/05/15 0738 01/13/15 1431  BNP 1205.8* 1555.2*    ProBNP (last 3 results) No results for input(s): PROBNP in the last 8760 hours.  CBG: No results for input(s): GLUCAP in the last 168 hours.  No results found for this or any previous visit (from the past 240 hour(s)).   Studies: Dg Chest 2 View  01/15/2015   CLINICAL DATA:  Followup pulmonary edema question resolution, symptomatic improvement since dialysis, history CHF, hypertension, diabetes mellitus, renal cancer, monoclonal stroke, cardiomyopathy  EXAM: CHEST  2 VIEW  COMPARISON:  01/13/2015  FINDINGS: Enlargement of cardiac silhouette with decreased vascular congestion versus prior exam.  Stable mediastinal contours.  Improved perihilar infiltrates likely improved edema.  Central peribronchial thickening remains.  Tiny bibasilar pleural effusions, decreased.  No pneumothorax or segmental consolidation.  Bones unremarkable.  IMPRESSION: Improved pulmonary edema.   Electronically Signed   By: Lavonia Dana M.D.   On: 01/15/2015 15:10   US Abdomen Complete  01/13/2015   CLINICAL DATA:  Right upper quadrant abdominal pain for 3 weeks.  EXAM: ULTRASOUND ABDOMEN COMPLETE  COMPARISON:  CT scan of December 18, 2014.  FINDINGS: Gallbladder: No gallstones or wall thickening visualized. No sonographic Murphy sign noted.  Common bile duct: Diameter: 4.2 mm which is within normal limits.  Liver: 12 mm low density is noted in  left hepatic lobe consistent with simple cyst; this corresponds to abnormality seen on prior CT scan. Within normal limits in parenchymal echogenicity.  IVC: No abnormality visualized.  Pancreas: Visualized portion unremarkable.  Spleen: Size and appearance within normal limits.  Status post right nephrectomy.  Left Kidney: Length: 8.3 cm. Increased echogenicity is noted of renal parenchymal. No mass or hydronephrosis visualized.  Abdominal aorta: No aneurysm visualized.  Other findings: Small right pleural effusion.  IMPRESSION: Small left hepatic cyst. Status post right nephrectomy. Left renal atrophy. No acute abnormality seen in the abdomen.   Electronically Signed   By: Marijo Conception, M.D.   On: 01/13/2015 18:36    Scheduled Meds: . amLODipine  10  mg Oral QPC lunch  . aspirin  325 mg Oral Daily  . [START ON 01/16/2015] calcium acetate  667 mg Oral TID WC  . calcium acetate  667-2,001 mg Oral With snacks  . carvedilol  12.5 mg Oral BID WC  . feeding supplement (NEPRO CARB STEADY)  237 mL Oral BID BM  . heparin  5,000 Units Subcutaneous 3 times per day  . isosorbide dinitrate  20 mg Oral Daily  . multivitamin  1 tablet Oral QHS  . pantoprazole  40 mg Oral Q1200  . sodium chloride  3 mL Intravenous Q12H   Continuous Infusions:     Time spent: 25 minutes    Edris Friedt, [place]  [facility] Pager [phone]. If 7PM-7AM, please contact night-coverage at [email or web address], password [facility] 01/15/2015, 4:45 PM  LOS: 2 days

## 2015-01-16 ENCOUNTER — Inpatient Hospital Stay (HOSPITAL_COMMUNITY): Payer: Medicare Other

## 2015-01-16 DIAGNOSIS — J9601 Acute respiratory failure with hypoxia: Secondary | ICD-10-CM

## 2015-01-16 MED ORDER — AMLODIPINE BESYLATE 5 MG PO TABS: 5.0000 mg | ORAL_TABLET | Freq: Every day | ORAL | Status: DC

## 2015-01-16 MED ORDER — NEPRO/CARBSTEADY PO LIQD: 237.0000 mL | Freq: Two times a day (BID) | ORAL | Status: DC

## 2015-01-16 MED ORDER — CARVEDILOL 25 MG PO TABS: 12.5000 mg | ORAL_TABLET | Freq: Two times a day (BID) | ORAL | Status: DC

## 2015-01-16 MED ORDER — TECHNETIUM TC 99M SESTAMIBI GENERIC - CARDIOLITE
10.0000 | Freq: Once | INTRAVENOUS | Status: AC | PRN
  Administered 2015-01-16: 10 via INTRAVENOUS

## 2015-01-16 MED ORDER — REGADENOSON 0.4 MG/5ML IV SOLN
INTRAVENOUS | Status: AC
  Filled 2015-01-16: qty 5

## 2015-01-16 MED ORDER — REGADENOSON 0.4 MG/5ML IV SOLN
0.4000 mg | Freq: Once | INTRAVENOUS | Status: AC
  Administered 2015-01-16: 0.4 mg via INTRAVENOUS
  Filled 2015-01-16: qty 5

## 2015-01-16 MED ORDER — PANTOPRAZOLE SODIUM 40 MG PO TBEC: 40.0000 mg | DELAYED_RELEASE_TABLET | Freq: Every day | ORAL | Status: DC

## 2015-01-16 MED ORDER — DARBEPOETIN ALFA 100 MCG/0.5ML IJ SOSY: 100.0000 ug | PREFILLED_SYRINGE | INTRAMUSCULAR | Status: DC

## 2015-01-16 MED ORDER — TECHNETIUM TC 99M SESTAMIBI GENERIC - CARDIOLITE
30.0000 | Freq: Once | INTRAVENOUS | Status: AC | PRN
  Administered 2015-01-16: 30 via INTRAVENOUS

## 2015-01-16 NOTE — Progress Notes (Signed)
Subjective:  No cos tolerated hd yest on schedule  , no am sob , for stress testing this am   Objective Vital signs in last 24 hours: Filed Vitals:   01/15/15 1750 01/15/15 2037 01/16/15 0415 01/16/15 0747  BP: 132/76 110/68 111/73 118/78  Pulse: 78 80 76 80  Temp: 98.6 F (37 C) 97.5 F (36.4 C) 98.5 F (36.9 C) 97.9 F (36.6 C)  TempSrc: Oral Oral Oral Oral  Resp: 18 17 16 17   Height:      Weight:  113.263 kg (249 lb 11.2 oz)    SpO2: 100% 100% 99% 97%   Weight change: 1.4 kg (3 lb 1.4 oz)  Physical Exam: General:alert Obese, NAD OX3 appropriate  Heart: RRR 2/6 sem lsb , no rub or gallop Lungs: CTA nonlabored  Abdomen: obese, bs pos. Soft NT, ND Extremities: No pedal edema  Dialysis Access: = + bruit HD L fa avf  Dialysis Orders: Center: [facility] on MWF . EDW 116 HD Bath 2k, 2.25ca Time 4Heparin 12000 initial Then 4000 intermittent. Access L FA AVF  Mircera 63mcg hec 91mcg  Other op lab hgb 9.4 ca 9.4 phos 5.0 pth 474  Problem/Plan: 1. Pulm edema / vol overload/ _- has had serial HDs  cxr yest after hd = pul . Edema resolving/ Card Dr. Doylene Canard eval now /CM on 2decho = CM ef 25-30 % sev. MR and recent decr appetie/ diverticlur dz - edw down to ``111kg wt post hd yest.  2. ESRD - HD MWF lower edw at dc  3. Hypertension/volume - 118/78/  AMLODIPINE 10MG  HS  May need to taper down with  vol removal on hd  4. Anemia -  Hgb 9.9 / continue esa  In hosp 100 Aranesp tomorrow  Fu hgb  5. Metabolic bone disease - phos stable/phos decreasing since admit = decr binders in hosp/ vit d 6. R Upper Abd pain /Recent Diverticulitis admit - per admit eval / abd [place] = nad - "last night appetite better ate Hamburger, starving this am" , npo for stress testing 7. DM type 2   Ernest Haber, PA-C [place] [phone] 01/16/2015,9:07 AM  LOS: 3 days   Pt seen, examined and agree w A/P as above. HD Fri, decrease volume, decrease BP meds.   Kelly Splinter MD pager [phone]    cell [phone] 01/16/2015, 1:49 PM    Labs: Basic Metabolic Panel:  Recent Labs Lab 01/13/15 2141 01/14/15 0703 01/15/15 0900  NA 137 135 134*  K 4.0 3.7 4.2  CL 96* 96* 96*  CO2 30 29 28   GLUCOSE 121* 152* 111*  BUN 13 18 28*  CREATININE 8.05*  7.79* 9.14* 8.57*  CALCIUM 9.2 9.4 9.6  PHOS 2.8  --  4.0   Liver Function Tests:  Recent Labs Lab 01/13/15 1430 01/13/15 2141 01/14/15 0703 01/15/15 0900  AST 15  --  15  --   ALT 8*  --  10*  --   ALKPHOS 59  --  58  --   BILITOT 1.1  --  0.9  --   PROT 7.5  --  7.2  --   ALBUMIN 3.3* 3.1* 3.2* 3.2*    Recent Labs Lab 01/14/15 1613  LIPASE 22   No results for input(s): AMMONIA in the last 168 hours. CBC:  Recent Labs Lab 01/13/15 1431 01/13/15 2141 01/14/15 0703 01/15/15 0859  WBC 3.8* 4.2 3.2* 4.1  NEUTROABS 2.1  --   --  2.3  HGB 10.1* 9.6* 9.7* 9.9*  HCT 30.5* 29.0* 30.0* 29.8*  MCV 91.6 91.2 91.7 90.3  PLT 197 212 205 181   Cardiac Enzymes:  Recent Labs Lab 01/13/15 2141 01/14/15 0030 01/14/15 0703  TROPONINI 0.41* 0.37* 0.40*   CBG: No results for input(s): GLUCAP in the last 168 hours.  Studies/Results: Dg Chest 2 View  01/15/2015   CLINICAL DATA:  Followup pulmonary edema question resolution, symptomatic improvement since dialysis, history CHF, hypertension, diabetes mellitus, renal cancer, monoclonal stroke, cardiomyopathy  EXAM: CHEST  2 VIEW  COMPARISON:  01/13/2015  FINDINGS: Enlargement of cardiac silhouette with decreased vascular congestion versus prior exam.  Stable mediastinal contours.  Improved perihilar infiltrates likely improved edema.  Central peribronchial thickening remains.  Tiny bibasilar pleural effusions, decreased.  No pneumothorax or segmental consolidation.  Bones unremarkable.  IMPRESSION: Improved pulmonary edema.   Electronically Signed   By: Lavonia Dana M.D.   On: 01/15/2015 15:10   Medications:   . amLODipine  10 mg Oral  QPC lunch  . aspirin  325 mg Oral Daily  . calcium acetate  667 mg Oral TID WC  . calcium acetate  667-2,001 mg Oral With snacks  . carvedilol  12.5 mg Oral BID WC  . feeding supplement (NEPRO CARB STEADY)  237 mL Oral BID BM  . heparin  5,000 Units Subcutaneous 3 times per day  . isosorbide dinitrate  20 mg Oral Daily  . multivitamin  1 tablet Oral QHS  . pantoprazole  40 mg Oral Q1200  . sodium chloride  3 mL Intravenous Q12H

## 2015-01-16 NOTE — Discharge Summary (Signed)
Physician Discharge Summary  [name] [id number] DOB: [birth year] DOA: 01/13/2015  PCP: Maggie Font, MD   Primary cardiologist: Dr. Doylene Canard  Admit date: 01/13/2015 Discharge date: 01/16/2015  Time spent: <30 minutes  Recommendations for Outpatient Follow-up:  1. Discharge home with outpatient follow-up with dialysis . Follow-up with his cardiologist in 1 week.  Discharge Diagnoses:  Principal Problem: Acute respiratory failure with hypoxia  Active problems   Systolic CHF, chronic   DM II (diabetes mellitus, type II), controlled   End stage renal disease   Pulmonary edema   RUQ pain    Discharge Condition: Fair  Diet recommendation: Heart healthy/ renal  Filed Weights   01/15/15 0827 01/15/15 1314 01/15/15 2037  Weight: 114.1 kg (251 lb 8.7 oz) 111 kg (244 lb 11.4 oz) 113.263 kg (249 lb 11.2 oz)    History of present illness:  Please refer to admission H&P for details, in brief, 54 year old male with history of end-stage renal disease on dialysis (M, W,F) type 2 diabetes mellitus, systolic CHF with EF of [id number] and diverticulitis presented to the hospital with ongoing dyspnea on exertion for past 3 weeks. Although he has adequate volume removal with dialysis his symptoms have not improved and has progressive dyspnea on exertion. On presentation his BNP was 1500, chest x-ray showing interstitial pulmonary patterns. A CT angiogram of the chest done one week back for shortness of breath was negative for central, lobar or proximal segmental sized pulmonary embolus. Study was limited due to respiratory motion. Patient admitted for acute pulmonary edema.  Hospital Course:   Acute hypoxic respiratory failure secondary to pulmonary edema Patient has significant improvement after 2 sessions of dialysis. He had elevated troponin which is likely due to demand ischemia. -2 d repeated echo with EF of 25-30%% and worsened diffuse hypokinesis compared to previously. Patient had  negative stress test about one year back. -d/w his cardiologist Dr Doylene Canard who recommended a repeat nuclear stress test that was done today and showed diffuse global hypokinesia with dilated ventricles, moderate size fixed defect at the base of the heart and mid apex. EF of 23%. -Discussed stress test results with his cardiologist who recommends that findings are unchanged from prior study and recommends outpatient follow-up with him in one week.   Right upper quadrant pain .Ultrasound of the abdomen shows a left hepatic lobe cyst without gallstones or thickening. No Murphy sign noted. The liver is normal.  lipase normal. Has reflux symptoms suggestive of GERD. Symptoms resolved after adding Protonix. We will provide him prescription.  Chronic systolic CHF EF of 25 and 30% with increase diffuse hypokinesis. . Symptoms improved with dialysis. Discussed stress findings with his cardiologist. Continue Coreg, Imdur and aspirin.   Type 2 diabetes mellitus Listed on problem list but not on any medications. A1c 5.4  End-stage renal disease HD roday. Renal following.   Essential hypertension Continue amlodipine  Patient stable to be discharged home with outpatient follow-up. Instructed to be compliant with his diet, medications and outpatient dialysis follow-up.  Code Status: Full code Family Communication: Sister at bedside Disposition Plan: Home    Consultants:  Renal  cardiology  Procedures:  Scheduled dialysis  2D echo  Nuclear stress test   Antibiotics:   Discharge Exam: Filed Vitals:   01/16/15 0945  BP: 116/74  Pulse: 88  Temp:   Resp:     General: Middle aged male in no acute distress HEENT: No pallor, moist oral mucosa, supple neck Chest: Clear to auscultation bilaterally CVS: Normal  S1 and S2, no murmurs or gallop GI: Soft, nondistended, no right upper quadrant tenderness, bowel sounds present Musculoskeletal: Warm, no edema CNS: Alert and  oriented   Discharge Instructions    Current Discharge Medication List    START taking these medications   Details  Nutritional Supplements (FEEDING SUPPLEMENT, NEPRO CARB STEADY,) LIQD Take 237 mLs by mouth 2 (two) times daily between meals. Qty: 60 Can, Refills: 0    pantoprazole (PROTONIX) 40 MG tablet Take 1 tablet (40 mg total) by mouth daily at 12 noon. Qty: 30 tablet, Refills: 0      CONTINUE these medications which have NOT CHANGED   Details  amLODipine (NORVASC) 10 MG tablet Take 10 mg by mouth daily after lunch.     aspirin 325 MG tablet Take 325 mg by mouth daily.     b complex-vitamin c-folic acid (NEPHRO-VITE) 0.8 MG TABS tablet Take 1 tablet by mouth at bedtime.    calcium acetate (PHOSLO) 667 MG capsule Take 667-2,001 mg by mouth 5 (five) times daily. Takes 2 capsules three times a day with meals.  Also takes 1-3 capsule with snacks, dose depends on what the snack is    isosorbide dinitrate (ISORDIL) 20 MG tablet Take 20 mg by mouth daily.     carvedilol (COREG) 25 MG tablet Take 12.5 mg by mouth 2 (two) times daily with a meal.    oxyCODONE-acetaminophen (PERCOCET/ROXICET) 5-325 MG per tablet Take 1 tablet by mouth at bedtime as needed for moderate pain. Qty: 20 tablet, Refills: 0      STOP taking these medications     ciprofloxacin (CIPRO) 500 MG tablet      metroNIDAZOLE (FLAGYL) 500 MG tablet        Allergies  Allergen Reactions  . Ace Inhibitors Cough   Follow-up Information    Follow up with [facility], MD. Call in 1 week.   Specialty:  Cardiology   Contact information:   [address] [place] [id number] [phone]        The results of significant diagnostics from this hospitalization (including imaging, microbiology, ancillary and laboratory) are listed below for reference.    Significant Diagnostic Studies: Ct Abdomen Pelvis Wo Contrast  12/18/2014   CLINICAL DATA:  Acute right lower quadrant abdominal pain.   EXAM: CT ABDOMEN AND PELVIS WITHOUT CONTRAST  TECHNIQUE: Multidetector CT imaging of the abdomen and pelvis was performed following the standard protocol without IV contrast.  COMPARISON:  CT scan of May 19, 2011.  FINDINGS: Severe degenerative disc disease is noted at L4-5. Visualized lung bases appear normal.  No gallstones are noted. The spleen and pancreas appear normal. 12 mm low density is noted in left hepatic lobe which is increased in size compared to prior exam and demonstrates average Hounsfield measurement of 38. Adrenal glands appear normal. Status post right nephrectomy. Left renal atrophy is noted. No hydronephrosis or renal obstruction is noted. Atherosclerosis of abdominal aorta is noted without aneurysm formation. The appendix appears normal. Inflammatory changes are noted adjacent to descending colon most consistent with focal diverticulitis. Urinary bladder is decompressed. Small fat containing periumbilical hernia is noted which is decreased in size compared to prior exam. No significant adenopathy is noted.  IMPRESSION: Severe focal diverticulitis of descending colon is noted. No abscess is seen at this time.  Atherosclerosis of abdominal aorta is noted without aneurysm formation.  Left renal atrophy.  Status post right nephrectomy.  12 mm low density seen in left hepatic lobe  with average Hounsfield measurement of 38. This is increased in size compared to prior exam. This may simply represent atypical cyst or hemangioma, but further evaluation with MRI on nonemergent basis is recommended.   Electronically Signed   By: Marijo Conception, M.D.   On: 12/18/2014 14:36   Dg Chest 2 View  01/15/2015   CLINICAL DATA:  Followup pulmonary edema question resolution, symptomatic improvement since dialysis, history CHF, hypertension, diabetes mellitus, renal cancer, monoclonal stroke, cardiomyopathy  EXAM: CHEST  2 VIEW  COMPARISON:  01/13/2015  FINDINGS: Enlargement of cardiac silhouette with  decreased vascular congestion versus prior exam.  Stable mediastinal contours.  Improved perihilar infiltrates likely improved edema.  Central peribronchial thickening remains.  Tiny bibasilar pleural effusions, decreased.  No pneumothorax or segmental consolidation.  Bones unremarkable.  IMPRESSION: Improved pulmonary edema.   Electronically Signed   By: Lavonia Dana M.D.   On: 01/15/2015 15:10   Dg Chest 2 View  01/13/2015   CLINICAL DATA:  Shortness of breath 2-3 weeks  EXAM: CHEST  2 VIEW  COMPARISON:  01/05/2015  FINDINGS: Mild cardiomegaly noted with prominence of the central vascular pedicles. Mild prominence of the interstitial markings is noted with a few Kerley B-lines. Trace pleural effusions. No focal pulmonary opacity. No acute osseous abnormality. Flattening of the hemidiaphragms may suggest emphysema.  IMPRESSION: Cardiomegaly with probable mild interstitial edema and trace pleural effusions. No focal pulmonary opacity.   Electronically Signed   By: Conchita Paris M.D.   On: 01/13/2015 12:58   Dg Chest 2 View  01/05/2015   CLINICAL DATA:  Acute onset of generalized chest pain and shortness of breath. Initial encounter.  EXAM: CHEST  2 VIEW  COMPARISON:  Chest radiograph performed 12/24/2014  FINDINGS: The lungs are well-aerated. Mild bibasilar atelectasis is noted. Pulmonary vascularity is at the upper limits of normal. There is no evidence of pleural effusion or pneumothorax.  The heart is enlarged.  No acute osseous abnormalities are seen.  IMPRESSION: Mild bibasilar atelectasis noted.  Cardiomegaly seen.   Electronically Signed   By: Garald Balding M.D.   On: 01/05/2015 03:13   Dg Chest 2 View  12/24/2014   CLINICAL DATA:  Shortness of breath.  Chest pain.  EXAM: CHEST  2 VIEW  COMPARISON:  None.  FINDINGS: Mediastinum hilar structures normal. Cardiomegaly with pulmonary vascular prominence. Mild interstitial prominence consistent with mild interstitial edema. Small left pleural effusion.  No acute bony abnormality.  IMPRESSION: Cardiomegaly with mild pulmonary vascular prominence and interstitial prominence suggesting mild congestive heart failure. Small left pleural effusion.   Electronically Signed   By: Marcello Moores  Register   On: 12/24/2014 12:34   Ct Angio Chest Pe W/cm &/or Wo Cm  01/05/2015   CLINICAL DATA:  54 year old male with dyspnea, chest pain with deep inspiration and shortness of breath for the past week.  EXAM: CT ANGIOGRAPHY CHEST WITH CONTRAST  TECHNIQUE: Multidetector CT imaging of the chest was performed using the standard protocol during bolus administration of intravenous contrast. Multiplanar CT image reconstructions and MIPs were obtained to evaluate the vascular anatomy.  CONTRAST:  178mL OMNIPAQUE IOHEXOL 350 MG/ML SOLN  COMPARISON:  No priors.  FINDINGS: Comment: Study is severely limited by a large amount of patient respiratory motion.  Mediastinum/Lymph Nodes: While there are no central, lobar or proximal segmental size filling defects to suggest pulmonary embolism, a smaller distal segmental or subsegmental size filling defects cannot be entirely excluded secondary to excessive patient respiratory motion. Heart size  is moderately enlarged with left ventricular and left atrial dilatation. Small amount of pericardial fluid and/or thickening, unlikely to be of hemodynamic significance at this time. No associated pericardial calcification. There is atherosclerosis of the thoracic aorta, the great vessels of the mediastinum and the coronary arteries, including calcified atherosclerotic plaque in the left main and left anterior descending coronary arteries. No pathologically enlarged mediastinal or hilar lymph nodes. Esophagus is unremarkable in appearance. No axillary lymphadenopathy.  Lungs/Pleura: Evaluation of the lung parenchyma for small pulmonary nodules is not possible secondary to excessive patient respiratory motion. No large suspicious appearing pulmonary nodule or  mass is identified. No confluent consolidative airspace disease.  Upper Abdomen: 1 cm low-attenuation lesion in segment 2 of the liver is compatible with a small simple cyst. Surgical clips in the upper right retroperitoneum. Visualized portions of the left kidney appear mildly atrophic.  Musculoskeletal/Soft Tissues: There are no aggressive appearing lytic or blastic lesions noted in the visualized portions of the skeleton.  Review of the MIP images confirms the above findings.  IMPRESSION: 1. Limited examination secondary to excessive patient respiratory motion. With these limitations in mind, there is no central, lobar or proximal segmental sized pulmonary embolus. Distal segmental and subsegmental sized emboli cannot be excluded secondary to respiratory motion. 2. Moderate cardiomegaly with left atrial and left ventricular dilatation. 3. Atherosclerosis, including left main and left anterior descending coronary artery disease. Please note that although the presence of coronary artery calcium documents the presence of coronary artery disease, the severity of this disease and any potential stenosis cannot be assessed on this non-gated CT examination. Assessment for potential risk factor modification, dietary therapy or pharmacologic therapy may be warranted, if clinically indicated. 4. Additional incidental findings, as above.   Electronically Signed   By: Vinnie Langton M.D.   On: 01/05/2015 09:30   US Abdomen Complete  01/13/2015   CLINICAL DATA:  Right upper quadrant abdominal pain for 3 weeks.  EXAM: ULTRASOUND ABDOMEN COMPLETE  COMPARISON:  CT scan of December 18, 2014.  FINDINGS: Gallbladder: No gallstones or wall thickening visualized. No sonographic Murphy sign noted.  Common bile duct: Diameter: 4.2 mm which is within normal limits.  Liver: 12 mm low density is noted in left hepatic lobe consistent with simple cyst; this corresponds to abnormality seen on prior CT scan. Within normal limits in  parenchymal echogenicity.  IVC: No abnormality visualized.  Pancreas: Visualized portion unremarkable.  Spleen: Size and appearance within normal limits.  Status post right nephrectomy.  Left Kidney: Length: 8.3 cm. Increased echogenicity is noted of renal parenchymal. No mass or hydronephrosis visualized.  Abdominal aorta: No aneurysm visualized.  Other findings: Small right pleural effusion.  IMPRESSION: Small left hepatic cyst. Status post right nephrectomy. Left renal atrophy. No acute abnormality seen in the abdomen.   Electronically Signed   By: Marijo Conception, M.D.   On: 01/13/2015 18:36   Nm Myocar Multi W/spect W/wall Motion / Ef  01/16/2015   CLINICAL DATA:  54 year old male with a history of chest pain.  Cardiovascular risk factors include known coronary artery disease with prior MI, prior TIA/ stroke, hypertension, diabetes  EXAM: MYOCARDIAL IMAGING WITH SPECT (REST AND PHARMACOLOGIC-STRESS)  GATED LEFT VENTRICULAR WALL MOTION STUDY  LEFT VENTRICULAR EJECTION FRACTION  TECHNIQUE: Standard myocardial SPECT imaging was performed after resting intravenous injection of 10 mCi Tc-78m sestamibi. Subsequently, intravenous infusion of Lexiscan was performed under the supervision of the Cardiology staff. At peak effect of the drug, 30 mCi Tc-28m  sestamibi was injected intravenously and standard myocardial SPECT imaging was performed. Quantitative gated imaging was also performed to evaluate left ventricular wall motion, and estimate left ventricular ejection fraction.  COMPARISON:  None.  FINDINGS: Perfusion: Moderate-sized fixed defect on the base of the heart, mid day apex. No stress perfusion defect.  Wall Motion: Global hypokinesia with dilated ventricle.  Left Ventricular Ejection Fraction: 23 %  End diastolic volume [id number] ml. This falls in the severely abnormal category.  End systolic volume [id number] ml  IMPRESSION: 1. Moderate-size fixed defect at the base of the heart, mid day apex.  2. Dilated ventricle  with global hypokinesis.  3. Left ventricular ejection fraction 23%  4. High-risk stress test findings*.  *2012 Appropriate Use Criteria for Coronary Revascularization Focused Update: J Am Coll Cardiol. B5713794. http://content.airportbarriers.com.aspx?articleid=1201161   Electronically Signed   By: Corrie Mckusick D.O.   On: 01/16/2015 14:42    Microbiology: No results found for this or any previous visit (from the past 240 hour(s)).   Labs: Basic Metabolic Panel:  Recent Labs Lab 01/13/15 1430 01/13/15 1431 01/13/15 2141 01/14/15 0703 01/15/15 0900  NA  --  140 137 135 134*  K  --  3.6 4.0 3.7 4.2  CL  --  98* 96* 96* 96*  CO2  --  32 30 29 28   GLUCOSE  --  81 121* 152* 111*  BUN  --  9 13 18  28*  CREATININE  --  6.95* 8.05*  7.79* 9.14* 8.57*  CALCIUM  --  9.3 9.2 9.4 9.6  MG 2.1  --   --   --   --   PHOS  --   --  2.8  --  4.0   Liver Function Tests:  Recent Labs Lab 01/13/15 1430 01/13/15 2141 01/14/15 0703 01/15/15 0900  AST 15  --  15  --   ALT 8*  --  10*  --   ALKPHOS 59  --  58  --   BILITOT 1.1  --  0.9  --   PROT 7.5  --  7.2  --   ALBUMIN 3.3* 3.1* 3.2* 3.2*    Recent Labs Lab 01/14/15 1613  LIPASE 22   No results for input(s): AMMONIA in the last 168 hours. CBC:  Recent Labs Lab 01/13/15 1431 01/13/15 2141 01/14/15 0703 01/15/15 0859  WBC 3.8* 4.2 3.2* 4.1  NEUTROABS 2.1  --   --  2.3  HGB 10.1* 9.6* 9.7* 9.9*  HCT 30.5* 29.0* 30.0* 29.8*  MCV 91.6 91.2 91.7 90.3  PLT 197 212 205 181   Cardiac Enzymes:  Recent Labs Lab 01/13/15 2141 01/14/15 0030 01/14/15 0703  TROPONINI 0.41* 0.37* 0.40*   BNP: BNP (last 3 results)  Recent Labs  01/05/15 0738 01/13/15 1431  BNP 1205.8* 1555.2*    ProBNP (last 3 results) No results for input(s): PROBNP in the last 8760 hours.  CBG: No results for input(s): GLUCAP in the last 168 hours.     SignedLouellen Molder  [facility] 01/16/2015, 2:52 PM

## 2015-01-16 NOTE — Progress Notes (Signed)
Pt ready to be discharged to home by daughter. Pt. Is alert and oriented. Pt is hemodynamically stable. AVS reviewed with pt. Capable of re verbalizing medication regimen. IV removed. Discharge plan appropriate and in place.

## 2015-01-16 NOTE — Care Management Important Message (Signed)
Important Message  Patient Details  Name: [name] MRN: [id number] Date of Birth: [birth year]   Medicare Important Message Given:  Yes-second notification given    Delorse Lek 01/16/2015, 12:05 PM

## 2015-01-16 NOTE — Progress Notes (Signed)
Per Angela Nevin in [place], patient strict NPO.

## 2015-01-21 DIAGNOSIS — K219 Gastro-esophageal reflux disease without esophagitis: Secondary | ICD-10-CM | POA: Diagnosis not present

## 2015-01-21 DIAGNOSIS — Z1211 Encounter for screening for malignant neoplasm of colon: Secondary | ICD-10-CM | POA: Diagnosis not present

## 2015-01-21 DIAGNOSIS — K5730 Diverticulosis of large intestine without perforation or abscess without bleeding: Secondary | ICD-10-CM | POA: Diagnosis not present

## 2015-01-22 DIAGNOSIS — N186 End stage renal disease: Secondary | ICD-10-CM | POA: Diagnosis not present

## 2015-01-22 DIAGNOSIS — I120 Hypertensive chronic kidney disease with stage 5 chronic kidney disease or end stage renal disease: Secondary | ICD-10-CM | POA: Diagnosis not present

## 2015-01-22 DIAGNOSIS — Z992 Dependence on renal dialysis: Secondary | ICD-10-CM | POA: Diagnosis not present

## 2015-01-23 ENCOUNTER — Ambulatory Visit: Payer: Medicare Other | Admitting: Physician Assistant

## 2015-01-24 DIAGNOSIS — E1129 Type 2 diabetes mellitus with other diabetic kidney complication: Secondary | ICD-10-CM | POA: Diagnosis not present

## 2015-01-24 DIAGNOSIS — N186 End stage renal disease: Secondary | ICD-10-CM | POA: Diagnosis not present

## 2015-01-24 DIAGNOSIS — D509 Iron deficiency anemia, unspecified: Secondary | ICD-10-CM | POA: Diagnosis not present

## 2015-01-24 DIAGNOSIS — D631 Anemia in chronic kidney disease: Secondary | ICD-10-CM | POA: Diagnosis not present

## 2015-02-03 DIAGNOSIS — N186 End stage renal disease: Secondary | ICD-10-CM | POA: Diagnosis not present

## 2015-02-03 DIAGNOSIS — N2581 Secondary hyperparathyroidism of renal origin: Secondary | ICD-10-CM | POA: Diagnosis not present

## 2015-02-05 DIAGNOSIS — N186 End stage renal disease: Secondary | ICD-10-CM | POA: Diagnosis not present

## 2015-02-05 DIAGNOSIS — N2581 Secondary hyperparathyroidism of renal origin: Secondary | ICD-10-CM | POA: Diagnosis not present

## 2015-02-07 DIAGNOSIS — N186 End stage renal disease: Secondary | ICD-10-CM | POA: Diagnosis not present

## 2015-02-07 DIAGNOSIS — N2581 Secondary hyperparathyroidism of renal origin: Secondary | ICD-10-CM | POA: Diagnosis not present

## 2015-02-11 DIAGNOSIS — I5022 Chronic systolic (congestive) heart failure: Secondary | ICD-10-CM | POA: Diagnosis not present

## 2015-02-11 DIAGNOSIS — I120 Hypertensive chronic kidney disease with stage 5 chronic kidney disease or end stage renal disease: Secondary | ICD-10-CM | POA: Diagnosis not present

## 2015-02-11 DIAGNOSIS — R1032 Left lower quadrant pain: Secondary | ICD-10-CM | POA: Diagnosis not present

## 2015-02-11 DIAGNOSIS — I2510 Atherosclerotic heart disease of native coronary artery without angina pectoris: Secondary | ICD-10-CM | POA: Diagnosis not present

## 2015-02-11 DIAGNOSIS — N186 End stage renal disease: Secondary | ICD-10-CM | POA: Diagnosis not present

## 2015-02-18 DIAGNOSIS — N186 End stage renal disease: Secondary | ICD-10-CM | POA: Diagnosis not present

## 2015-02-18 DIAGNOSIS — E1122 Type 2 diabetes mellitus with diabetic chronic kidney disease: Secondary | ICD-10-CM | POA: Diagnosis not present

## 2015-02-18 DIAGNOSIS — E669 Obesity, unspecified: Secondary | ICD-10-CM | POA: Diagnosis not present

## 2015-02-19 ENCOUNTER — Ambulatory Visit: Payer: Medicare Other | Admitting: Gastroenterology

## 2015-02-21 DIAGNOSIS — N186 End stage renal disease: Secondary | ICD-10-CM | POA: Diagnosis not present

## 2015-02-21 DIAGNOSIS — Z992 Dependence on renal dialysis: Secondary | ICD-10-CM | POA: Diagnosis not present

## 2015-02-21 DIAGNOSIS — I120 Hypertensive chronic kidney disease with stage 5 chronic kidney disease or end stage renal disease: Secondary | ICD-10-CM | POA: Diagnosis not present

## 2015-02-24 DIAGNOSIS — D509 Iron deficiency anemia, unspecified: Secondary | ICD-10-CM | POA: Diagnosis not present

## 2015-02-24 DIAGNOSIS — E1129 Type 2 diabetes mellitus with other diabetic kidney complication: Secondary | ICD-10-CM | POA: Diagnosis not present

## 2015-02-24 DIAGNOSIS — E213 Hyperparathyroidism, unspecified: Secondary | ICD-10-CM | POA: Diagnosis not present

## 2015-02-24 DIAGNOSIS — D631 Anemia in chronic kidney disease: Secondary | ICD-10-CM | POA: Diagnosis not present

## 2015-02-24 DIAGNOSIS — N186 End stage renal disease: Secondary | ICD-10-CM | POA: Diagnosis not present

## 2015-02-26 DIAGNOSIS — E1129 Type 2 diabetes mellitus with other diabetic kidney complication: Secondary | ICD-10-CM | POA: Diagnosis not present

## 2015-02-26 DIAGNOSIS — E213 Hyperparathyroidism, unspecified: Secondary | ICD-10-CM | POA: Diagnosis not present

## 2015-02-26 DIAGNOSIS — D631 Anemia in chronic kidney disease: Secondary | ICD-10-CM | POA: Diagnosis not present

## 2015-02-26 DIAGNOSIS — D509 Iron deficiency anemia, unspecified: Secondary | ICD-10-CM | POA: Diagnosis not present

## 2015-02-26 DIAGNOSIS — N186 End stage renal disease: Secondary | ICD-10-CM | POA: Diagnosis not present

## 2015-02-28 DIAGNOSIS — N186 End stage renal disease: Secondary | ICD-10-CM | POA: Diagnosis not present

## 2015-02-28 DIAGNOSIS — E1129 Type 2 diabetes mellitus with other diabetic kidney complication: Secondary | ICD-10-CM | POA: Diagnosis not present

## 2015-02-28 DIAGNOSIS — D509 Iron deficiency anemia, unspecified: Secondary | ICD-10-CM | POA: Diagnosis not present

## 2015-02-28 DIAGNOSIS — E213 Hyperparathyroidism, unspecified: Secondary | ICD-10-CM | POA: Diagnosis not present

## 2015-02-28 DIAGNOSIS — D631 Anemia in chronic kidney disease: Secondary | ICD-10-CM | POA: Diagnosis not present

## 2015-03-03 DIAGNOSIS — D631 Anemia in chronic kidney disease: Secondary | ICD-10-CM | POA: Diagnosis not present

## 2015-03-03 DIAGNOSIS — N186 End stage renal disease: Secondary | ICD-10-CM | POA: Diagnosis not present

## 2015-03-03 DIAGNOSIS — D509 Iron deficiency anemia, unspecified: Secondary | ICD-10-CM | POA: Diagnosis not present

## 2015-03-03 DIAGNOSIS — E213 Hyperparathyroidism, unspecified: Secondary | ICD-10-CM | POA: Diagnosis not present

## 2015-03-03 DIAGNOSIS — E1129 Type 2 diabetes mellitus with other diabetic kidney complication: Secondary | ICD-10-CM | POA: Diagnosis not present

## 2015-03-05 DIAGNOSIS — E213 Hyperparathyroidism, unspecified: Secondary | ICD-10-CM | POA: Diagnosis not present

## 2015-03-05 DIAGNOSIS — N186 End stage renal disease: Secondary | ICD-10-CM | POA: Diagnosis not present

## 2015-03-05 DIAGNOSIS — E1129 Type 2 diabetes mellitus with other diabetic kidney complication: Secondary | ICD-10-CM | POA: Diagnosis not present

## 2015-03-05 DIAGNOSIS — D509 Iron deficiency anemia, unspecified: Secondary | ICD-10-CM | POA: Diagnosis not present

## 2015-03-05 DIAGNOSIS — D631 Anemia in chronic kidney disease: Secondary | ICD-10-CM | POA: Diagnosis not present

## 2015-03-07 DIAGNOSIS — D631 Anemia in chronic kidney disease: Secondary | ICD-10-CM | POA: Diagnosis not present

## 2015-03-07 DIAGNOSIS — E213 Hyperparathyroidism, unspecified: Secondary | ICD-10-CM | POA: Diagnosis not present

## 2015-03-07 DIAGNOSIS — E1129 Type 2 diabetes mellitus with other diabetic kidney complication: Secondary | ICD-10-CM | POA: Diagnosis not present

## 2015-03-07 DIAGNOSIS — N186 End stage renal disease: Secondary | ICD-10-CM | POA: Diagnosis not present

## 2015-03-07 DIAGNOSIS — D509 Iron deficiency anemia, unspecified: Secondary | ICD-10-CM | POA: Diagnosis not present

## 2015-03-10 DIAGNOSIS — E1129 Type 2 diabetes mellitus with other diabetic kidney complication: Secondary | ICD-10-CM | POA: Diagnosis not present

## 2015-03-10 DIAGNOSIS — D631 Anemia in chronic kidney disease: Secondary | ICD-10-CM | POA: Diagnosis not present

## 2015-03-10 DIAGNOSIS — D509 Iron deficiency anemia, unspecified: Secondary | ICD-10-CM | POA: Diagnosis not present

## 2015-03-10 DIAGNOSIS — N186 End stage renal disease: Secondary | ICD-10-CM | POA: Diagnosis not present

## 2015-03-10 DIAGNOSIS — E213 Hyperparathyroidism, unspecified: Secondary | ICD-10-CM | POA: Diagnosis not present

## 2015-03-11 DIAGNOSIS — I120 Hypertensive chronic kidney disease with stage 5 chronic kidney disease or end stage renal disease: Secondary | ICD-10-CM | POA: Diagnosis not present

## 2015-03-11 DIAGNOSIS — R1032 Left lower quadrant pain: Secondary | ICD-10-CM | POA: Diagnosis not present

## 2015-03-11 DIAGNOSIS — N186 End stage renal disease: Secondary | ICD-10-CM | POA: Diagnosis not present

## 2015-03-11 DIAGNOSIS — I5022 Chronic systolic (congestive) heart failure: Secondary | ICD-10-CM | POA: Diagnosis not present

## 2015-03-11 DIAGNOSIS — I2510 Atherosclerotic heart disease of native coronary artery without angina pectoris: Secondary | ICD-10-CM | POA: Diagnosis not present

## 2015-03-12 DIAGNOSIS — E1129 Type 2 diabetes mellitus with other diabetic kidney complication: Secondary | ICD-10-CM | POA: Diagnosis not present

## 2015-03-12 DIAGNOSIS — E213 Hyperparathyroidism, unspecified: Secondary | ICD-10-CM | POA: Diagnosis not present

## 2015-03-12 DIAGNOSIS — D509 Iron deficiency anemia, unspecified: Secondary | ICD-10-CM | POA: Diagnosis not present

## 2015-03-12 DIAGNOSIS — N186 End stage renal disease: Secondary | ICD-10-CM | POA: Diagnosis not present

## 2015-03-12 DIAGNOSIS — D631 Anemia in chronic kidney disease: Secondary | ICD-10-CM | POA: Diagnosis not present

## 2015-03-14 DIAGNOSIS — D509 Iron deficiency anemia, unspecified: Secondary | ICD-10-CM | POA: Diagnosis not present

## 2015-03-14 DIAGNOSIS — D631 Anemia in chronic kidney disease: Secondary | ICD-10-CM | POA: Diagnosis not present

## 2015-03-14 DIAGNOSIS — E1129 Type 2 diabetes mellitus with other diabetic kidney complication: Secondary | ICD-10-CM | POA: Diagnosis not present

## 2015-03-14 DIAGNOSIS — E213 Hyperparathyroidism, unspecified: Secondary | ICD-10-CM | POA: Diagnosis not present

## 2015-03-14 DIAGNOSIS — N186 End stage renal disease: Secondary | ICD-10-CM | POA: Diagnosis not present

## 2015-03-17 DIAGNOSIS — D509 Iron deficiency anemia, unspecified: Secondary | ICD-10-CM | POA: Diagnosis not present

## 2015-03-17 DIAGNOSIS — E1129 Type 2 diabetes mellitus with other diabetic kidney complication: Secondary | ICD-10-CM | POA: Diagnosis not present

## 2015-03-17 DIAGNOSIS — E213 Hyperparathyroidism, unspecified: Secondary | ICD-10-CM | POA: Diagnosis not present

## 2015-03-17 DIAGNOSIS — D631 Anemia in chronic kidney disease: Secondary | ICD-10-CM | POA: Diagnosis not present

## 2015-03-17 DIAGNOSIS — N186 End stage renal disease: Secondary | ICD-10-CM | POA: Diagnosis not present

## 2015-03-19 DIAGNOSIS — E213 Hyperparathyroidism, unspecified: Secondary | ICD-10-CM | POA: Diagnosis not present

## 2015-03-19 DIAGNOSIS — N186 End stage renal disease: Secondary | ICD-10-CM | POA: Diagnosis not present

## 2015-03-19 DIAGNOSIS — D631 Anemia in chronic kidney disease: Secondary | ICD-10-CM | POA: Diagnosis not present

## 2015-03-19 DIAGNOSIS — E1129 Type 2 diabetes mellitus with other diabetic kidney complication: Secondary | ICD-10-CM | POA: Diagnosis not present

## 2015-03-19 DIAGNOSIS — D509 Iron deficiency anemia, unspecified: Secondary | ICD-10-CM | POA: Diagnosis not present

## 2015-03-21 DIAGNOSIS — E1129 Type 2 diabetes mellitus with other diabetic kidney complication: Secondary | ICD-10-CM | POA: Diagnosis not present

## 2015-03-21 DIAGNOSIS — D631 Anemia in chronic kidney disease: Secondary | ICD-10-CM | POA: Diagnosis not present

## 2015-03-21 DIAGNOSIS — D509 Iron deficiency anemia, unspecified: Secondary | ICD-10-CM | POA: Diagnosis not present

## 2015-03-21 DIAGNOSIS — E213 Hyperparathyroidism, unspecified: Secondary | ICD-10-CM | POA: Diagnosis not present

## 2015-03-21 DIAGNOSIS — N186 End stage renal disease: Secondary | ICD-10-CM | POA: Diagnosis not present

## 2015-03-24 DIAGNOSIS — I120 Hypertensive chronic kidney disease with stage 5 chronic kidney disease or end stage renal disease: Secondary | ICD-10-CM | POA: Diagnosis not present

## 2015-03-24 DIAGNOSIS — E1129 Type 2 diabetes mellitus with other diabetic kidney complication: Secondary | ICD-10-CM | POA: Diagnosis not present

## 2015-03-24 DIAGNOSIS — D509 Iron deficiency anemia, unspecified: Secondary | ICD-10-CM | POA: Diagnosis not present

## 2015-03-24 DIAGNOSIS — E213 Hyperparathyroidism, unspecified: Secondary | ICD-10-CM | POA: Diagnosis not present

## 2015-03-24 DIAGNOSIS — N186 End stage renal disease: Secondary | ICD-10-CM | POA: Diagnosis not present

## 2015-03-24 DIAGNOSIS — D631 Anemia in chronic kidney disease: Secondary | ICD-10-CM | POA: Diagnosis not present

## 2015-03-24 DIAGNOSIS — Z992 Dependence on renal dialysis: Secondary | ICD-10-CM | POA: Diagnosis not present

## 2015-03-25 DIAGNOSIS — K5792 Diverticulitis of intestine, part unspecified, without perforation or abscess without bleeding: Secondary | ICD-10-CM

## 2015-03-25 HISTORY — DX: Diverticulitis of intestine, part unspecified, without perforation or abscess without bleeding: K57.92

## 2015-03-26 DIAGNOSIS — N2581 Secondary hyperparathyroidism of renal origin: Secondary | ICD-10-CM | POA: Diagnosis not present

## 2015-03-26 DIAGNOSIS — D631 Anemia in chronic kidney disease: Secondary | ICD-10-CM | POA: Diagnosis not present

## 2015-03-26 DIAGNOSIS — E1129 Type 2 diabetes mellitus with other diabetic kidney complication: Secondary | ICD-10-CM | POA: Diagnosis not present

## 2015-03-26 DIAGNOSIS — D509 Iron deficiency anemia, unspecified: Secondary | ICD-10-CM | POA: Diagnosis not present

## 2015-03-26 DIAGNOSIS — N186 End stage renal disease: Secondary | ICD-10-CM | POA: Diagnosis not present

## 2015-03-28 DIAGNOSIS — E1129 Type 2 diabetes mellitus with other diabetic kidney complication: Secondary | ICD-10-CM | POA: Diagnosis not present

## 2015-03-28 DIAGNOSIS — N186 End stage renal disease: Secondary | ICD-10-CM | POA: Diagnosis not present

## 2015-03-28 DIAGNOSIS — D631 Anemia in chronic kidney disease: Secondary | ICD-10-CM | POA: Diagnosis not present

## 2015-03-28 DIAGNOSIS — D509 Iron deficiency anemia, unspecified: Secondary | ICD-10-CM | POA: Diagnosis not present

## 2015-03-28 DIAGNOSIS — N2581 Secondary hyperparathyroidism of renal origin: Secondary | ICD-10-CM | POA: Diagnosis not present

## 2015-03-31 DIAGNOSIS — E1129 Type 2 diabetes mellitus with other diabetic kidney complication: Secondary | ICD-10-CM | POA: Diagnosis not present

## 2015-03-31 DIAGNOSIS — D509 Iron deficiency anemia, unspecified: Secondary | ICD-10-CM | POA: Diagnosis not present

## 2015-03-31 DIAGNOSIS — N186 End stage renal disease: Secondary | ICD-10-CM | POA: Diagnosis not present

## 2015-03-31 DIAGNOSIS — D631 Anemia in chronic kidney disease: Secondary | ICD-10-CM | POA: Diagnosis not present

## 2015-03-31 DIAGNOSIS — N2581 Secondary hyperparathyroidism of renal origin: Secondary | ICD-10-CM | POA: Diagnosis not present

## 2015-04-02 DIAGNOSIS — E1129 Type 2 diabetes mellitus with other diabetic kidney complication: Secondary | ICD-10-CM | POA: Diagnosis not present

## 2015-04-02 DIAGNOSIS — D509 Iron deficiency anemia, unspecified: Secondary | ICD-10-CM | POA: Diagnosis not present

## 2015-04-02 DIAGNOSIS — N2581 Secondary hyperparathyroidism of renal origin: Secondary | ICD-10-CM | POA: Diagnosis not present

## 2015-04-02 DIAGNOSIS — N186 End stage renal disease: Secondary | ICD-10-CM | POA: Diagnosis not present

## 2015-04-02 DIAGNOSIS — D631 Anemia in chronic kidney disease: Secondary | ICD-10-CM | POA: Diagnosis not present

## 2015-04-04 DIAGNOSIS — D509 Iron deficiency anemia, unspecified: Secondary | ICD-10-CM | POA: Diagnosis not present

## 2015-04-04 DIAGNOSIS — N186 End stage renal disease: Secondary | ICD-10-CM | POA: Diagnosis not present

## 2015-04-04 DIAGNOSIS — D631 Anemia in chronic kidney disease: Secondary | ICD-10-CM | POA: Diagnosis not present

## 2015-04-04 DIAGNOSIS — N2581 Secondary hyperparathyroidism of renal origin: Secondary | ICD-10-CM | POA: Diagnosis not present

## 2015-04-04 DIAGNOSIS — E1129 Type 2 diabetes mellitus with other diabetic kidney complication: Secondary | ICD-10-CM | POA: Diagnosis not present

## 2015-04-07 DIAGNOSIS — D509 Iron deficiency anemia, unspecified: Secondary | ICD-10-CM | POA: Diagnosis not present

## 2015-04-07 DIAGNOSIS — N186 End stage renal disease: Secondary | ICD-10-CM | POA: Diagnosis not present

## 2015-04-07 DIAGNOSIS — N2581 Secondary hyperparathyroidism of renal origin: Secondary | ICD-10-CM | POA: Diagnosis not present

## 2015-04-07 DIAGNOSIS — D631 Anemia in chronic kidney disease: Secondary | ICD-10-CM | POA: Diagnosis not present

## 2015-04-07 DIAGNOSIS — E1129 Type 2 diabetes mellitus with other diabetic kidney complication: Secondary | ICD-10-CM | POA: Diagnosis not present

## 2015-04-09 DIAGNOSIS — E1129 Type 2 diabetes mellitus with other diabetic kidney complication: Secondary | ICD-10-CM | POA: Diagnosis not present

## 2015-04-09 DIAGNOSIS — N2581 Secondary hyperparathyroidism of renal origin: Secondary | ICD-10-CM | POA: Diagnosis not present

## 2015-04-09 DIAGNOSIS — N186 End stage renal disease: Secondary | ICD-10-CM | POA: Diagnosis not present

## 2015-04-09 DIAGNOSIS — D631 Anemia in chronic kidney disease: Secondary | ICD-10-CM | POA: Diagnosis not present

## 2015-04-09 DIAGNOSIS — D509 Iron deficiency anemia, unspecified: Secondary | ICD-10-CM | POA: Diagnosis not present

## 2015-04-11 DIAGNOSIS — N186 End stage renal disease: Secondary | ICD-10-CM | POA: Diagnosis not present

## 2015-04-11 DIAGNOSIS — D631 Anemia in chronic kidney disease: Secondary | ICD-10-CM | POA: Diagnosis not present

## 2015-04-11 DIAGNOSIS — E1129 Type 2 diabetes mellitus with other diabetic kidney complication: Secondary | ICD-10-CM | POA: Diagnosis not present

## 2015-04-11 DIAGNOSIS — N2581 Secondary hyperparathyroidism of renal origin: Secondary | ICD-10-CM | POA: Diagnosis not present

## 2015-04-11 DIAGNOSIS — D509 Iron deficiency anemia, unspecified: Secondary | ICD-10-CM | POA: Diagnosis not present

## 2015-04-14 DIAGNOSIS — N186 End stage renal disease: Secondary | ICD-10-CM | POA: Diagnosis not present

## 2015-04-14 DIAGNOSIS — N2581 Secondary hyperparathyroidism of renal origin: Secondary | ICD-10-CM | POA: Diagnosis not present

## 2015-04-14 DIAGNOSIS — D509 Iron deficiency anemia, unspecified: Secondary | ICD-10-CM | POA: Diagnosis not present

## 2015-04-14 DIAGNOSIS — E1129 Type 2 diabetes mellitus with other diabetic kidney complication: Secondary | ICD-10-CM | POA: Diagnosis not present

## 2015-04-14 DIAGNOSIS — D631 Anemia in chronic kidney disease: Secondary | ICD-10-CM | POA: Diagnosis not present

## 2015-04-16 DIAGNOSIS — D631 Anemia in chronic kidney disease: Secondary | ICD-10-CM | POA: Diagnosis not present

## 2015-04-16 DIAGNOSIS — E1129 Type 2 diabetes mellitus with other diabetic kidney complication: Secondary | ICD-10-CM | POA: Diagnosis not present

## 2015-04-16 DIAGNOSIS — N186 End stage renal disease: Secondary | ICD-10-CM | POA: Diagnosis not present

## 2015-04-16 DIAGNOSIS — D509 Iron deficiency anemia, unspecified: Secondary | ICD-10-CM | POA: Diagnosis not present

## 2015-04-16 DIAGNOSIS — N2581 Secondary hyperparathyroidism of renal origin: Secondary | ICD-10-CM | POA: Diagnosis not present

## 2015-04-19 DIAGNOSIS — N2581 Secondary hyperparathyroidism of renal origin: Secondary | ICD-10-CM | POA: Diagnosis not present

## 2015-04-19 DIAGNOSIS — N186 End stage renal disease: Secondary | ICD-10-CM | POA: Diagnosis not present

## 2015-04-19 DIAGNOSIS — D631 Anemia in chronic kidney disease: Secondary | ICD-10-CM | POA: Diagnosis not present

## 2015-04-19 DIAGNOSIS — D509 Iron deficiency anemia, unspecified: Secondary | ICD-10-CM | POA: Diagnosis not present

## 2015-04-19 DIAGNOSIS — E1129 Type 2 diabetes mellitus with other diabetic kidney complication: Secondary | ICD-10-CM | POA: Diagnosis not present

## 2015-04-21 ENCOUNTER — Emergency Department (HOSPITAL_COMMUNITY): Payer: Medicare Other

## 2015-04-21 ENCOUNTER — Encounter (HOSPITAL_COMMUNITY): Payer: Self-pay | Admitting: *Deleted

## 2015-04-21 ENCOUNTER — Inpatient Hospital Stay (HOSPITAL_COMMUNITY)
Admission: EM | Admit: 2015-04-21 | Discharge: 2015-04-24 | DRG: 391 | Disposition: A | Discharge status: Home / Self Care | Payer: Medicare Other | Attending: Internal Medicine | Admitting: Internal Medicine

## 2015-04-21 DIAGNOSIS — I340 Nonrheumatic mitral (valve) insufficiency: Secondary | ICD-10-CM | POA: Diagnosis present

## 2015-04-21 DIAGNOSIS — E1122 Type 2 diabetes mellitus with diabetic chronic kidney disease: Secondary | ICD-10-CM | POA: Diagnosis present

## 2015-04-21 DIAGNOSIS — M1A9XX0 Chronic gout, unspecified, without tophus (tophi): Secondary | ICD-10-CM | POA: Diagnosis present

## 2015-04-21 DIAGNOSIS — N2581 Secondary hyperparathyroidism of renal origin: Secondary | ICD-10-CM | POA: Diagnosis not present

## 2015-04-21 DIAGNOSIS — I429 Cardiomyopathy, unspecified: Secondary | ICD-10-CM | POA: Diagnosis present

## 2015-04-21 DIAGNOSIS — Z905 Acquired absence of kidney: Secondary | ICD-10-CM | POA: Diagnosis not present

## 2015-04-21 DIAGNOSIS — Z7982 Long term (current) use of aspirin: Secondary | ICD-10-CM | POA: Diagnosis not present

## 2015-04-21 DIAGNOSIS — I120 Hypertensive chronic kidney disease with stage 5 chronic kidney disease or end stage renal disease: Secondary | ICD-10-CM | POA: Diagnosis not present

## 2015-04-21 DIAGNOSIS — R1032 Left lower quadrant pain: Secondary | ICD-10-CM | POA: Diagnosis not present

## 2015-04-21 DIAGNOSIS — N186 End stage renal disease: Secondary | ICD-10-CM | POA: Diagnosis not present

## 2015-04-21 DIAGNOSIS — Z8673 Personal history of transient ischemic attack (TIA), and cerebral infarction without residual deficits: Secondary | ICD-10-CM

## 2015-04-21 DIAGNOSIS — D631 Anemia in chronic kidney disease: Secondary | ICD-10-CM | POA: Diagnosis not present

## 2015-04-21 DIAGNOSIS — K5790 Diverticulosis of intestine, part unspecified, without perforation or abscess without bleeding: Secondary | ICD-10-CM | POA: Diagnosis not present

## 2015-04-21 DIAGNOSIS — Z992 Dependence on renal dialysis: Secondary | ICD-10-CM

## 2015-04-21 DIAGNOSIS — K5792 Diverticulitis of intestine, part unspecified, without perforation or abscess without bleeding: Secondary | ICD-10-CM

## 2015-04-21 DIAGNOSIS — D649 Anemia, unspecified: Secondary | ICD-10-CM | POA: Diagnosis present

## 2015-04-21 DIAGNOSIS — I10 Essential (primary) hypertension: Secondary | ICD-10-CM | POA: Diagnosis not present

## 2015-04-21 DIAGNOSIS — E119 Type 2 diabetes mellitus without complications: Secondary | ICD-10-CM

## 2015-04-21 DIAGNOSIS — D509 Iron deficiency anemia, unspecified: Secondary | ICD-10-CM | POA: Diagnosis not present

## 2015-04-21 DIAGNOSIS — I5022 Chronic systolic (congestive) heart failure: Secondary | ICD-10-CM | POA: Diagnosis present

## 2015-04-21 DIAGNOSIS — H5441 Blindness, right eye, normal vision left eye: Secondary | ICD-10-CM | POA: Diagnosis present

## 2015-04-21 DIAGNOSIS — I5042 Chronic combined systolic (congestive) and diastolic (congestive) heart failure: Secondary | ICD-10-CM | POA: Diagnosis present

## 2015-04-21 DIAGNOSIS — K5732 Diverticulitis of large intestine without perforation or abscess without bleeding: Principal | ICD-10-CM | POA: Diagnosis present

## 2015-04-21 DIAGNOSIS — I252 Old myocardial infarction: Secondary | ICD-10-CM

## 2015-04-21 DIAGNOSIS — I132 Hypertensive heart and chronic kidney disease with heart failure and with stage 5 chronic kidney disease, or end stage renal disease: Secondary | ICD-10-CM | POA: Diagnosis present

## 2015-04-21 DIAGNOSIS — E875 Hyperkalemia: Secondary | ICD-10-CM | POA: Diagnosis not present

## 2015-04-21 DIAGNOSIS — I2510 Atherosclerotic heart disease of native coronary artery without angina pectoris: Secondary | ICD-10-CM | POA: Diagnosis present

## 2015-04-21 DIAGNOSIS — E1129 Type 2 diabetes mellitus with other diabetic kidney complication: Secondary | ICD-10-CM | POA: Diagnosis not present

## 2015-04-21 HISTORY — DX: Disorder of kidney and ureter, unspecified: N28.9

## 2015-04-21 HISTORY — DX: Diverticulitis of intestine, part unspecified, without perforation or abscess without bleeding: K57.92

## 2015-04-21 LAB — CBC
HCT: 43.9 % (ref 39.0–52.0)
Hemoglobin: 14.8 g/dL (ref 13.0–17.0)
MCH: 31.8 pg (ref 26.0–34.0)
MCHC: 33.7 g/dL (ref 30.0–36.0)
MCV: 94.2 fL (ref 78.0–100.0)
Platelets: 124 10*3/uL — ABNORMAL LOW (ref 150–400)
RBC: 4.66 MIL/uL (ref 4.22–5.81)
RDW: 16.1 % — ABNORMAL HIGH (ref 11.5–15.5)
WBC: 9.9 10*3/uL (ref 4.0–10.5)

## 2015-04-21 LAB — COMPREHENSIVE METABOLIC PANEL
ALT: 9 U/L — ABNORMAL LOW (ref 17–63)
AST: 9 U/L — ABNORMAL LOW (ref 15–41)
Albumin: 3.6 g/dL (ref 3.5–5.0)
Alkaline Phosphatase: 88 U/L (ref 38–126)
Anion gap: 13 (ref 5–15)
BUN: 20 mg/dL (ref 6–20)
CO2: 31 mmol/L (ref 22–32)
Calcium: 9.8 mg/dL (ref 8.9–10.3)
Chloride: 93 mmol/L — ABNORMAL LOW (ref 101–111)
Creatinine, Ser: 8.72 mg/dL — ABNORMAL HIGH (ref 0.61–1.24)
GFR calc Af Amer: 7 mL/min — ABNORMAL LOW (ref >60)
GFR calc non Af Amer: 6 mL/min — ABNORMAL LOW (ref >60)
Glucose, Bld: 92 mg/dL (ref 65–99)
Potassium: 4.7 mmol/L (ref 3.5–5.1)
Sodium: 137 mmol/L (ref 135–145)
Total Bilirubin: 1.3 mg/dL — ABNORMAL HIGH (ref 0.3–1.2)
Total Protein: 8.7 g/dL — ABNORMAL HIGH (ref 6.5–8.1)

## 2015-04-21 LAB — LIPASE, BLOOD: Lipase: 24 U/L (ref 11–51)

## 2015-04-21 LAB — GLUCOSE, CAPILLARY: Glucose-Capillary: 80 mg/dL (ref 65–99)

## 2015-04-21 MED ORDER — ACETAMINOPHEN 325 MG PO TABS: 650.0000 mg | ORAL_TABLET | Freq: Four times a day (QID) | ORAL | Status: DC | PRN

## 2015-04-21 MED ORDER — HYDROMORPHONE HCL 1 MG/ML IJ SOLN
0.5000 mg | Freq: Once | INTRAMUSCULAR | Status: AC
  Administered 2015-04-21: 0.5 mg via INTRAVENOUS
  Filled 2015-04-21: qty 1

## 2015-04-21 MED ORDER — CIPROFLOXACIN IN D5W 400 MG/200ML IV SOLN
400.0000 mg | Freq: Once | INTRAVENOUS | Status: AC
  Administered 2015-04-21: 400 mg via INTRAVENOUS
  Filled 2015-04-21: qty 200

## 2015-04-21 MED ORDER — ACETAMINOPHEN 650 MG RE SUPP: 650.0000 mg | Freq: Four times a day (QID) | RECTAL | Status: DC | PRN

## 2015-04-21 MED ORDER — IOHEXOL 300 MG/ML  SOLN
80.0000 mL | Freq: Once | INTRAMUSCULAR | Status: AC | PRN
  Administered 2015-04-21: 80 mL via INTRAVENOUS

## 2015-04-21 MED ORDER — INSULIN ASPART 100 UNIT/ML ~~LOC~~ SOLN
0.0000 [IU] | SUBCUTANEOUS | Status: DC
  Administered 2015-04-23: 1 [IU] via SUBCUTANEOUS

## 2015-04-21 MED ORDER — ONDANSETRON HCL 4 MG/2ML IJ SOLN: 4.0000 mg | Freq: Four times a day (QID) | INTRAMUSCULAR | Status: DC | PRN

## 2015-04-21 MED ORDER — SODIUM CHLORIDE 0.9 % IJ SOLN
3.0000 mL | Freq: Two times a day (BID) | INTRAMUSCULAR | Status: DC
  Administered 2015-04-21 – 2015-04-23 (×5): 3 mL via INTRAVENOUS

## 2015-04-21 MED ORDER — ASPIRIN 325 MG PO TABS
325.0000 mg | ORAL_TABLET | Freq: Every day | ORAL | Status: DC
  Administered 2015-04-22 – 2015-04-24 (×3): 325 mg via ORAL
  Filled 2015-04-21 (×3): qty 1

## 2015-04-21 MED ORDER — CARVEDILOL 12.5 MG PO TABS
12.5000 mg | ORAL_TABLET | Freq: Two times a day (BID) | ORAL | Status: DC
  Administered 2015-04-21 – 2015-04-24 (×5): 12.5 mg via ORAL
  Filled 2015-04-21 (×5): qty 1

## 2015-04-21 MED ORDER — ONDANSETRON HCL 4 MG PO TABS: 4.0000 mg | ORAL_TABLET | Freq: Four times a day (QID) | ORAL | Status: DC | PRN

## 2015-04-21 MED ORDER — AMLODIPINE BESYLATE 10 MG PO TABS
10.0000 mg | ORAL_TABLET | Freq: Every day | ORAL | Status: DC
  Administered 2015-04-22: 10 mg via ORAL
  Filled 2015-04-21: qty 1

## 2015-04-21 MED ORDER — ISOSORBIDE DINITRATE 10 MG PO TABS
20.0000 mg | ORAL_TABLET | Freq: Every day | ORAL | Status: DC
Start: 2015-04-22 — End: 2015-04-24
  Administered 2015-04-22 – 2015-04-24 (×3): 20 mg via ORAL
  Filled 2015-04-21 (×5): qty 2

## 2015-04-21 MED ORDER — CALCIUM ACETATE 667 MG PO CAPS: 667.0000 mg | ORAL_CAPSULE | Freq: Every day | ORAL | Status: DC

## 2015-04-21 MED ORDER — PANTOPRAZOLE SODIUM 40 MG PO TBEC
40.0000 mg | DELAYED_RELEASE_TABLET | Freq: Every day | ORAL | Status: DC
  Administered 2015-04-22 – 2015-04-24 (×3): 40 mg via ORAL
  Filled 2015-04-21 (×2): qty 1

## 2015-04-21 MED ORDER — CALCIUM ACETATE (PHOS BINDER) 667 MG PO CAPS: 667.0000 mg | ORAL_CAPSULE | Freq: Two times a day (BID) | ORAL | Status: DC | PRN

## 2015-04-21 MED ORDER — HYDROMORPHONE HCL 1 MG/ML IJ SOLN
0.5000 mg | INTRAMUSCULAR | Status: DC | PRN
  Administered 2015-04-21 – 2015-04-23 (×8): 0.5 mg via INTRAVENOUS
  Filled 2015-04-21 (×7): qty 1

## 2015-04-21 MED ORDER — CALCIUM ACETATE (PHOS BINDER) 667 MG PO CAPS
1334.0000 mg | ORAL_CAPSULE | Freq: Three times a day (TID) | ORAL | Status: DC
  Administered 2015-04-22 – 2015-04-24 (×5): 1334 mg via ORAL
  Filled 2015-04-21 (×5): qty 2

## 2015-04-21 MED ORDER — METRONIDAZOLE IN NACL 5-0.79 MG/ML-% IV SOLN
500.0000 mg | Freq: Once | INTRAVENOUS | Status: AC
Start: 2015-04-21 — End: 2015-04-21
  Administered 2015-04-21: 500 mg via INTRAVENOUS
  Filled 2015-04-21: qty 100

## 2015-04-21 MED ORDER — ONDANSETRON HCL 4 MG/2ML IJ SOLN
4.0000 mg | Freq: Once | INTRAMUSCULAR | Status: AC
  Administered 2015-04-21: 4 mg via INTRAVENOUS
  Filled 2015-04-21: qty 2

## 2015-04-21 MED ORDER — ZOLPIDEM TARTRATE 5 MG PO TABS
5.0000 mg | ORAL_TABLET | Freq: Once | ORAL | Status: AC
  Administered 2015-04-21: 5 mg via ORAL
  Filled 2015-04-21: qty 1

## 2015-04-21 NOTE — ED Notes (Signed)
Pt reports LLQ pain since yesterday. Having vomiting and denies diarrhea. Last bm was Friday. Dialysis pt, last treatment was this am.

## 2015-04-21 NOTE — H&P (Signed)
[facility] History and Physical  Jamonte Klemm [id number] DOB: [birth year] DOA: 04/21/2015  Referring physician: Dr. Alfonse Spruce. PCP: Maggie Font, MD  Specialists: Nephrologist.  Chief Complaint: Abdominal pain.  HPI: [name] is a 54 y.o. male with history of ESRD on hemodialysis on Monday Wednesday Friday, chronic systolic heart failure with severe mitral regurgitation, diabetes mellitus on no medications presently presents to the ER because of worsening abdominal pain. Patient's pain is mostly in the left lower quadrant. Patient has been having these symptoms since Friday 4 days ago. Has been having some nausea vomiting denies any diarrhea. Unable to keep in anything. In the ER CT abdomen and pelvis shows features consistent with sigmoid diverticulitis. Patient also has had a previous episode of diverticulitis in July of this year but since patient has systolic CHF colonoscopy was not pursued as per the patient. Patient on exam has tenderness on the left lower quadrant. CAT scan does not show any perforation or abscess at this time.   Review of Systems: As presented in the history of presenting illness, rest negative.  Past Medical History  Diagnosis Date  . CHF (congestive heart failure) ([place])   . History of unilateral nephrectomy   . Hypertension   . Cardiomyopathy   . Gout   . Myocardial infarction [facility]) ?2006  . Shortness of breath 05/19/11    "at rest, lying down, w/exertion"  . Umbilical hernia [id number]    unrepaired  . Stroke [facility]) 02/2011    05/19/11 denies residual  . Dialysis patient ([place])     Mon-Wed-Fri  . Diabetes mellitus     pt. states he's borderline diabetic., no longer taking med,- off med. since 2013  . Renal cell carcinoma     dialysis- MWF  . ESRD (end stage renal disease) ([place])   . Coronary artery disease     normal coronaries by 10/10/08 cath  . History of nephrectomy 07/04/2012    History of right nephrectomy in 2002 for renal cell carcinoma    . 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 Renal insufficiency    Past Surgical History  Procedure Laterality Date  . Nephrectomy  2000    right  . Av fistula placement  03/29/2011    Procedure: ARTERIOVENOUS (AV) FISTULA CREATION;  Surgeon: Hinda Lenis, MD;  Location: [place];  Service: Vascular;  Laterality: Left;  LEFT RADIOCEPHALIC , Arteriovenous [id number])  . Smashed  1990's    "left pinky; have a plate in there"  . US echocardiography  05/20/11  . Cardiac catheterization  05/21/11  . Finger surgery      L pinkie finger- ORIF- /w remaining hardware - 1990's    . Umbilical hernia repair N/A 07/04/2012    Procedure: LAPAROSCOPIC UMBILICAL HERNIA;  Surgeon: Madilyn Hook, DO;  Location: [place];  Service: General;  Laterality: N/A;  . Insertion of mesh N/A 07/04/2012    Procedure: INSERTION OF MESH;  Surgeon: Madilyn Hook, DO;  Location: [place];  Service: General;  Laterality: N/A;  . Hernia repair N/A [id number]    Umbilical hernia repair  . Dialysis cath placed    . Revison of arteriovenous fistula Left 06/05/2013    Procedure: REVISON OF LEFT RADIOCEPHALIC  ARTERIOVENOUS FISTULA;  Surgeon: Conrad Lander, MD;  Location: [place];  Service: Vascular;  Laterality: Left;  . Right heart catheterization N/A 05/21/2011    Procedure: RIGHT HEART CATH;  Surgeon: Birdie Riddle, MD;  Location: [place] CATH LAB;  Service: Cardiovascular;  Laterality: N/A;  . Left and right heart catheterization with coronary angiogram N/A 08/04/2012    Procedure: LEFT AND RIGHT HEART CATHETERIZATION WITH CORONARY ANGIOGRAM;  Surgeon: Birdie Riddle, MD;  Location: [place] CATH LAB;  Service: Cardiovascular;  Laterality: N/A;   Social History:  reports that he has never smoked. He has never used smokeless tobacco. He reports that he does not drink alcohol or use illicit drugs. Where does patient live home. Can patient participate in ADLs? Yes.  Allergies  Allergen Reactions  . Ace Inhibitors  Cough    Family History:  Family History  Problem Relation Age of Onset  . Hypertension Mother   . Kidney disease Mother       Prior to Admission medications   Medication Sig Start Date End Date Taking? Authorizing Provider  amLODipine (NORVASC) 10 MG tablet Take 10 mg by mouth daily after lunch.    Yes Historical Provider, MD  aspirin 325 MG tablet Take 325 mg by mouth daily.    Yes Historical Provider, MD  b complex-vitamin c-folic acid (NEPHRO-VITE) 0.8 MG TABS tablet Take 1 tablet by mouth at bedtime.   Yes Historical Provider, MD  calcium acetate (PHOSLO) 667 MG capsule Take 667-2,001 mg by mouth 5 (five) times daily. Takes 2 capsules three times a day with meals.  Also takes 1-3 capsule with snacks, dose depends on what the snack is   Yes Historical Provider, MD  carvedilol (COREG) 25 MG tablet Take 0.5 tablets (12.5 mg total) by mouth 2 (two) times daily with a meal. 01/16/15  Yes Nishant Dhungel, MD  Homeopathic Products (ARTHRITIS RELIEF PO) Take 2 tablets by mouth daily as needed (for pain).   Yes Historical Provider, MD  isosorbide dinitrate (ISORDIL) 20 MG tablet Take 20 mg by mouth daily.    Yes Historical Provider, MD  lidocaine-prilocaine (EMLA) cream Apply 1 application topically every Monday, Wednesday, and Friday. 04/08/15  Yes Historical Provider, MD  Nutritional Supplements (FEEDING SUPPLEMENT, NEPRO CARB STEADY,) LIQD Take 237 mLs by mouth 2 (two) times daily between meals. 01/16/15  Yes Nishant Dhungel, MD  pantoprazole (PROTONIX) 40 MG tablet Take 1 tablet (40 mg total) by mouth daily at 12 noon. 01/16/15  Yes Nishant Dhungel, MD  oxyCODONE-acetaminophen (PERCOCET/ROXICET) 5-325 MG per tablet Take 1 tablet by mouth at bedtime as needed for moderate pain. 12/20/14   Cristal Ford, DO    Physical Exam: Filed Vitals:   04/21/15 1945 04/21/15 2000 04/21/15 2015 04/21/15 2040  BP: 107/53 108/70 121/75 108/62  Pulse: 93 93 93 96  Temp:    98.7 F (37.1 C)  TempSrc:     Oral  Resp:    16  Height:    6\' 1"  (1.854 m)  Weight:    111.494 kg (245 lb 12.8 oz)  SpO2: 94% 96% 97% 94%     General:  Moderately built and nourished.  Eyes: Anicteric no pallor.  ENT: No discharge from the ears eyes nose and mouth.  Neck: No mass felt.  Cardiovascular: S1 and S2 heard.  Respiratory: No rhonchi or crepitations.  Abdomen: Tenderness in the left lower quadrant. No guarding or rigidity.  Skin: No rash.  Musculoskeletal: No edema.  Psychiatric: Appears normal.  Neurologic: Alert awake oriented to time place and person. Moves all extremities.  Labs on Admission:  Basic Metabolic Panel:  Recent Labs Lab 04/21/15 1150  NA 137  K 4.7  CL 93*  CO2 31  GLUCOSE 92  BUN 20  CREATININE 8.72*  CALCIUM 9.8   Liver Function Tests:  Recent Labs Lab 04/21/15 1150  AST 9*  ALT 9*  ALKPHOS 88  BILITOT 1.3*  PROT 8.7*  ALBUMIN 3.6    Recent Labs Lab 04/21/15 1150  LIPASE 24   No results for input(s): AMMONIA in the last 168 hours. CBC:  Recent Labs Lab 04/21/15 1150  WBC 9.9  HGB 14.8  HCT 43.9  MCV 94.2  PLT 124*   Cardiac Enzymes: No results for input(s): CKTOTAL, CKMB, CKMBINDEX, TROPONINI in the last 168 hours.  BNP (last 3 results)  Recent Labs  01/05/15 0738 01/13/15 1431  BNP 1205.8* 1555.2*    ProBNP (last 3 results) No results for input(s): PROBNP in the last 8760 hours.  CBG: No results for input(s): GLUCAP in the last 168 hours.  Radiological Exams on Admission: Ct Abdomen Pelvis W Contrast  04/21/2015  ADDENDUM REPORT: 04/21/2015 18:58 ADD TO IMPRESSION: Patient is status post right nephrectomy. Cysts in left kidney are consistent with chronic renal failure. Electronically Signed   By: Lowella Grip III M.D.   On: 04/21/2015 18:58  04/21/2015  CLINICAL DATA:  Five day history of left lower quadrant abdominal pain. Chronic renal failure. EXAM: CT ABDOMEN AND PELVIS WITH CONTRAST TECHNIQUE: Multidetector  CT imaging of the abdomen and pelvis was performed using the standard protocol following bolus administration of intravenous contrast. Oral contrast was also administered. CONTRAST:  82mL OMNIPAQUE IOHEXOL 300 MG/ML  SOLN COMPARISON:  December 18, 2014 FINDINGS: Lower chest: Visualized lung bases are clear. Heart is mildly enlarged with small pericardial effusion. Hepatobiliary: A previously noted focus of decreased attenuation with peripheral enhancement in the left lobe of the liver is not appreciably changed compared to prior studies. The decreased attenuation central region measures 1 cm with approximately 2.4 x 2.0 cm of peripheral enhancement. No new liver lesions are identified. Gallbladder is borderline distended without gallbladder wall thickening. There is no biliary duct dilatation. Pancreas: No mass or inflammatory focus. Spleen: No splenic lesions are identified. Adrenals/Urinary Tract: Right adrenal appears normal. Mild left adrenal hypertrophy is stable. Patient is status post right nephrectomy. There are multiple subcentimeter cysts throughout the left kidney. There is prominence of the renal sinus fat in the left kidney. There is no demonstrable hydronephrosis on the left. No renal or ureteral calculus is appreciable. Urinary bladder is completely collapsed. Stomach/Bowel: There is wall thickening in the mid to distal descending colon with surrounding mesenteric inflammation and several irregular diverticula consistent with diverticulitis. There is no frank abscess or perforation in this area of diverticulitis. There is no bowel obstruction. No free air or portal venous air. Vascular/Lymphatic: There is no abdominal aortic aneurysm. There are scattered foci of atherosclerotic calcification. The major mesenteric vessels appear patent. There is no adenopathy in the abdomen or pelvis. Reproductive: Prostate and seminal vesicles are normal in size and contour. No pelvic mass or pelvic fluid collection.  Other: The appendix appears normal. There is no abscess or ascites in the abdomen or pelvis. There is a moderate ventral hernia containing fat but no bowel. There is fat in the left inguinal ring. Musculoskeletal: There is degenerative change in the lumbar spine, most marked at L4-5. There are scattered probable bone islands. There are no lytic or destructive bone lesions. No intramuscular lesions are identified. IMPRESSION: Diverticulitis involving the mid to distal sigmoid colon without frank abscess or perforation. There is extensive mesenteric thickening surrounding this area of  diverticulitis. The wall of the mid to distal sigmoid colon is thickened, and there are several irregular diverticula in this area. Small pericardial effusion.  Mild cardiomegaly. Lesion left lobe of liver, noted previously which has a central area of decreased attenuation and peripheral enhancement. Suspect atypical hemangioma. Particular attention to this area on subsequent evaluations advised. Stable left adrenal hypertrophy. Moderate ventral hernia containing fat but no bowel. Electronically Signed: By: Lowella Grip III M.D. On: 04/21/2015 18:40     Assessment/Plan Principal Problem:   Diverticulitis of intestine without perforation or abscess without bleeding Active Problems:   Systolic CHF, chronic (HCC)   Hypertension, benign   DM II (diabetes mellitus, type II), controlled ([place])   Gout, chronic   End-stage renal disease on hemodialysis ([place])   Acute diverticulitis   1. Sigmoid diverticulitis - with no perforation or abscess formation as per the CAT scan reports. Patient has significant pain. Will check lactic acid to rule out any ischemia. I had place patient nothing by mouth for now. Place patient on Cipro and Flagyl. Closely monitor. If patient's pain does not improve may need repeat radiological studies. 2. ESRD on hemodialysis - patient states he has had dialysis yesterday. Gets dialyzed on Monday  Wednesday and Friday. Closely monitor metabolic panel. 3. Diabetes mellitus type 2 - presently on no medications since patient was on dialysis. We will check CBG closely since patient is nothing by mouth. 4. Hypertension - will continue amlodipine I saw [place] and Coreg. 5. History of systolic heart failure last EF measured was 30% with severe MR - fluid management per nephrologist.  I have reviewed patient's old charts and labs.   DVT Prophylaxis SCDs.  Code Status: Full code.  Family Communication: Patient's wife at the bedside.  Disposition Plan: Admit to inpatient.    Marlee Trentman N. [facility] Pager [phone].  If 7PM-7AM, please contact night-coverage [email or web address] Password [facility] 04/21/2015, 9:54 PM

## 2015-04-21 NOTE — ED Provider Notes (Signed)
CSN: [id number]     Arrival date & time 04/21/15  1109 History   First MD Initiated Contact with Patient 04/21/15 1432     Chief Complaint  Patient presents with  . Abdominal Pain     (Consider location/radiation/quality/duration/timing/severity/associated sxs/prior Treatment) HPI [name] is a 54 y.o. male with hx of CHF, nephrectomy, htn, MI, DM, ESRD on dialysis, presents to ED with complaint abdominal pain. Patient states his abdominal pain started 2 days ago. Patient states pain is mainly on the left side of the abdomen. Reports associated nausea and vomiting. Unable to keep anything down. He denies diarrhea. He also has not had a bowel movement in 4 days. He states he is not passing gas either. Patient does not urinate. His last dialysis was this morning and had finished full course. He denies any known fever but admits to chills. No history of similar pain in the past. No medications taken prior to coming in.   Past Medical History  Diagnosis Date  . CHF (congestive heart failure) ([place])   . History of unilateral nephrectomy   . Hypertension   . Cardiomyopathy   . Gout   . Myocardial infarction [facility]) ?2006  . Shortness of breath 05/19/11    "at rest, lying down, w/exertion"  . Umbilical hernia [id number]    unrepaired  . Stroke [facility]) 02/2011    05/19/11 denies residual  . Dialysis patient ([place])     Mon-Wed-Fri  . Diabetes mellitus     pt. states he's borderline diabetic., no longer taking med,- off med. since 2013  . Renal cell carcinoma     dialysis- MWF  . ESRD (end stage renal disease) ([place])   . Coronary artery disease     normal coronaries by 10/10/08 cath  . History of nephrectomy 07/04/2012    History of right nephrectomy in 2002 for renal cell carcinoma   . 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 Nephrectomy  2000    right  . Av fistula placement  03/29/2011    Procedure: ARTERIOVENOUS  (AV) FISTULA CREATION;  Surgeon: Hinda Lenis, MD;  Location: [place];  Service: Vascular;  Laterality: Left;  LEFT RADIOCEPHALIC , Arteriovenous [id number])  . Smashed  1990's    "left pinky; have a plate in there"  . US echocardiography  05/20/11  . Cardiac catheterization  05/21/11  . Finger surgery      L pinkie finger- ORIF- /w remaining hardware - 1990's    . Umbilical hernia repair N/A 07/04/2012    Procedure: LAPAROSCOPIC UMBILICAL HERNIA;  Surgeon: Madilyn Hook, DO;  Location: [place];  Service: General;  Laterality: N/A;  . Insertion of mesh N/A 07/04/2012    Procedure: INSERTION OF MESH;  Surgeon: Madilyn Hook, DO;  Location: [place];  Service: General;  Laterality: N/A;  . Hernia repair N/A [id number]    Umbilical hernia repair  . Dialysis cath placed    . Revison of arteriovenous fistula Left 06/05/2013    Procedure: REVISON OF LEFT RADIOCEPHALIC  ARTERIOVENOUS FISTULA;  Surgeon: Conrad Irion, MD;  Location: [place];  Service: Vascular;  Laterality: Left;  . Right heart catheterization N/A 05/21/2011    Procedure: RIGHT HEART CATH;  Surgeon: Birdie Riddle, MD;  Location: [facility] CATH LAB;  Service: Cardiovascular;  Laterality: N/A;  . Left and right heart catheterization with coronary angiogram N/A 08/04/2012  Procedure: LEFT AND RIGHT HEART CATHETERIZATION WITH CORONARY ANGIOGRAM;  Surgeon: Birdie Riddle, MD;  Location: [place] CATH LAB;  Service: Cardiovascular;  Laterality: N/A;   Family History  Problem Relation Age of Onset  . Hypertension Mother   . Kidney disease Mother    Social History  Substance Use Topics  . Smoking status: Never Smoker   . Smokeless tobacco: Never Used  . Alcohol Use: No    Review of Systems  Constitutional: Negative for fever and chills.  Respiratory: Negative for cough, chest tightness and shortness of breath.   Cardiovascular: Negative for chest pain, palpitations and leg swelling.  Gastrointestinal: Positive for nausea, vomiting, abdominal  pain and constipation. Negative for diarrhea, blood in stool and abdominal distention.  Musculoskeletal: Negative for myalgias, arthralgias, neck pain and neck stiffness.  Skin: Negative for rash.  Allergic/Immunologic: Negative for immunocompromised state.  Neurological: Negative for dizziness, weakness, light-headedness, numbness and headaches.  All other systems reviewed and are negative.     Allergies  Ace inhibitors  Home Medications   Prior to Admission medications   Medication Sig Start Date End Date Taking? Authorizing Provider  amLODipine (NORVASC) 10 MG tablet Take 10 mg by mouth daily after lunch.     Historical Provider, MD  aspirin 325 MG tablet Take 325 mg by mouth daily.     Historical Provider, MD  b complex-vitamin c-folic acid (NEPHRO-VITE) 0.8 MG TABS tablet Take 1 tablet by mouth at bedtime.    Historical Provider, MD  calcium acetate (PHOSLO) 667 MG capsule Take 667-2,001 mg by mouth 5 (five) times daily. Takes 2 capsules three times a day with meals.  Also takes 1-3 capsule with snacks, dose depends on what the snack is    Historical Provider, MD  carvedilol (COREG) 25 MG tablet Take 0.5 tablets (12.5 mg total) by mouth 2 (two) times daily with a meal. 01/16/15   Nishant Dhungel, MD  isosorbide dinitrate (ISORDIL) 20 MG tablet Take 20 mg by mouth daily.     Historical Provider, MD  Nutritional Supplements (FEEDING SUPPLEMENT, NEPRO CARB STEADY,) LIQD Take 237 mLs by mouth 2 (two) times daily between meals. 01/16/15   Nishant Dhungel, MD  oxyCODONE-acetaminophen (PERCOCET/ROXICET) 5-325 MG per tablet Take 1 tablet by mouth at bedtime as needed for moderate pain. 12/20/14   Maryann Mikhail, DO  pantoprazole (PROTONIX) 40 MG tablet Take 1 tablet (40 mg total) by mouth daily at 12 noon. 01/16/15   Nishant Dhungel, MD   BP 117/82 mmHg  Pulse 97  Temp(Src) 99.2 F (37.3 C) (Oral)  Resp 15  Ht 6' (1.829 m)  Wt 113.399 kg  BMI 33.90 kg/m2  SpO2 99% Physical Exam   Constitutional: He is oriented to person, place, and time. He appears well-developed and well-nourished. No distress.  HENT:  Head: Normocephalic and atraumatic.  Eyes: Conjunctivae are normal.  Neck: Neck supple.  Cardiovascular: Normal rate, regular rhythm and normal heart sounds.   Pulmonary/Chest: Effort normal. No respiratory distress. He has no wheezes. He has no rales.  Abdominal: Soft. Bowel sounds are normal. He exhibits no distension. There is tenderness. There is no rebound.  Epigastric, LUQ, LLQ tenderness  Musculoskeletal: He exhibits no edema.  Neurological: He is alert and oriented to person, place, and time.  Skin: Skin is warm and dry.  Nursing note and vitals reviewed.   ED Course  Procedures (including critical care time) Labs Review Labs Reviewed  COMPREHENSIVE METABOLIC PANEL - Abnormal; Notable for the following:  Chloride 93 (*)    Creatinine, Ser 8.72 (*)    Total Protein 8.7 (*)    AST 9 (*)    ALT 9 (*)    Total Bilirubin 1.3 (*)    GFR calc non Af Amer 6 (*)    GFR calc Af Amer 7 (*)    All other components within normal limits  CBC - Abnormal; Notable for the following:    RDW 16.1 (*)    Platelets 124 (*)    All other components within normal limits  LIPASE, BLOOD  URINALYSIS, ROUTINE W REFLEX MICROSCOPIC (NOT AT [facility])    Imaging Review No results found. I have personally reviewed and evaluated these images and lab results as part of my medical decision-making.   EKG Interpretation None      MDM   Final diagnoses:  Diverticulitis of intestine without perforation or abscess without bleeding    Pt with left sided abdominal pain, nausea, vomiting, unable to keep anything down. Abdomen tender with mild guarding. Labs already obtained at triage and did not show any concerning findings at this time. White blood cell count is normal. Will get CT scan of abdomen for evaluation. Pain medications and antiemetics ordered.  Pt signed out to  Dr. Alfonse Spruce at shift change, pending CT read.   Filed Vitals:   04/22/15 0959 04/22/15 1800 04/22/15 2056 04/23/15 0040  BP: 102/73 115/70 101/70 113/73  Pulse: 76 72 74 71  Temp: 98.2 F (36.8 C) 97.9 F (36.6 C) 98.2 F (36.8 C) 98.7 F (37.1 C)  TempSrc: Oral Oral Oral Oral  Resp: 18 18 18 18   Height:      Weight:   113.399 kg   SpO2: 95% 98% 97% 98%        Jeannett Senior, PA-C 04/23/15 0244  Noemi Chapel, MD 04/23/15 1004

## 2015-04-21 NOTE — ED Provider Notes (Signed)
Patient accepted in sign out pending CT.  CT showed diverticulitis without complication but patient still unable to tolerate PO.  Discussed with Dr. Hal Hope who agreed with admission.  Patient admitted under his care to Med-Surg.  Patient updated on results and plan of care.  Harvel Quale, MD 04/21/15 [phone]

## 2015-04-21 NOTE — ED Notes (Signed)
Family at bedside. 

## 2015-04-21 NOTE — ED Notes (Signed)
Patient is resting comfortably, with family at the bedside.

## 2015-04-21 NOTE — ED Notes (Signed)
Patient is out of the room.

## 2015-04-22 LAB — GLUCOSE, CAPILLARY
Glucose-Capillary: 129 mg/dL — ABNORMAL HIGH (ref 65–99)
Glucose-Capillary: 134 mg/dL — ABNORMAL HIGH (ref 65–99)
Glucose-Capillary: 68 mg/dL (ref 65–99)
Glucose-Capillary: 69 mg/dL (ref 65–99)
Glucose-Capillary: 71 mg/dL (ref 65–99)
Glucose-Capillary: 76 mg/dL (ref 65–99)
Glucose-Capillary: 80 mg/dL (ref 65–99)
Glucose-Capillary: 90 mg/dL (ref 65–99)
Glucose-Capillary: 94 mg/dL (ref 65–99)

## 2015-04-22 LAB — COMPREHENSIVE METABOLIC PANEL
ALT: 9 U/L — ABNORMAL LOW (ref 17–63)
AST: 9 U/L — ABNORMAL LOW (ref 15–41)
Albumin: 3.2 g/dL — ABNORMAL LOW (ref 3.5–5.0)
Alkaline Phosphatase: 85 U/L (ref 38–126)
Anion gap: 11 (ref 5–15)
BUN: 33 mg/dL — ABNORMAL HIGH (ref 6–20)
CO2: 32 mmol/L (ref 22–32)
Calcium: 10 mg/dL (ref 8.9–10.3)
Chloride: 90 mmol/L — ABNORMAL LOW (ref 101–111)
Creatinine, Ser: 11.65 mg/dL — ABNORMAL HIGH (ref 0.61–1.24)
GFR calc Af Amer: 5 mL/min — ABNORMAL LOW (ref >60)
GFR calc non Af Amer: 4 mL/min — ABNORMAL LOW (ref >60)
Glucose, Bld: 81 mg/dL (ref 65–99)
Potassium: 6.4 mmol/L (ref 3.5–5.1)
Sodium: 133 mmol/L — ABNORMAL LOW (ref 135–145)
Total Bilirubin: 1.3 mg/dL — ABNORMAL HIGH (ref 0.3–1.2)
Total Protein: 7.9 g/dL (ref 6.5–8.1)

## 2015-04-22 LAB — CBC WITH DIFFERENTIAL/PLATELET
Basophils Absolute: 0 10*3/uL (ref 0.0–0.1)
Basophils Relative: 0 %
Eosinophils Absolute: 0.1 10*3/uL (ref 0.0–0.7)
Eosinophils Relative: 1 %
HCT: 40.7 % (ref 39.0–52.0)
Hemoglobin: 13 g/dL (ref 13.0–17.0)
Lymphocytes Relative: 12 %
Lymphs Abs: 1.1 10*3/uL (ref 0.7–4.0)
MCH: 30.7 pg (ref 26.0–34.0)
MCHC: 31.9 g/dL (ref 30.0–36.0)
MCV: 96 fL (ref 78.0–100.0)
Monocytes Absolute: 0.7 10*3/uL (ref 0.1–1.0)
Monocytes Relative: 7 %
Neutro Abs: 7.4 10*3/uL (ref 1.7–7.7)
Neutrophils Relative %: 80 %
Platelets: 140 10*3/uL — ABNORMAL LOW (ref 150–400)
RBC: 4.24 MIL/uL (ref 4.22–5.81)
RDW: 16.1 % — ABNORMAL HIGH (ref 11.5–15.5)
WBC: 9.3 10*3/uL (ref 4.0–10.5)

## 2015-04-22 LAB — MRSA PCR SCREENING: MRSA by PCR: NEGATIVE

## 2015-04-22 LAB — LACTIC ACID, PLASMA: Lactic Acid, Venous: 1.1 mmol/L (ref 0.5–2.0)

## 2015-04-22 MED ORDER — DEXTROSE 50 % IV SOLN
INTRAVENOUS | Status: AC
  Administered 2015-04-22: 25 mL
  Filled 2015-04-22: qty 50

## 2015-04-22 MED ORDER — DOXERCALCIFEROL 4 MCG/2ML IV SOLN
4.0000 ug | INTRAVENOUS | Status: DC
  Administered 2015-04-23: 4 ug via INTRAVENOUS
  Filled 2015-04-22: qty 2

## 2015-04-22 MED ORDER — CIPROFLOXACIN IN D5W 400 MG/200ML IV SOLN
400.0000 mg | INTRAVENOUS | Status: DC
  Administered 2015-04-22 – 2015-04-23 (×2): 400 mg via INTRAVENOUS
  Filled 2015-04-22 (×2): qty 200

## 2015-04-22 MED ORDER — DEXTROSE 50 % IV SOLN
INTRAVENOUS | Status: AC
  Filled 2015-04-22: qty 50

## 2015-04-22 MED ORDER — BOOST / RESOURCE BREEZE PO LIQD
1.0000 | Freq: Two times a day (BID) | ORAL | Status: DC
  Administered 2015-04-23: 1 via ORAL

## 2015-04-22 MED ORDER — METRONIDAZOLE IN NACL 5-0.79 MG/ML-% IV SOLN
500.0000 mg | Freq: Three times a day (TID) | INTRAVENOUS | Status: DC
  Administered 2015-04-22 – 2015-04-24 (×6): 500 mg via INTRAVENOUS
  Filled 2015-04-22 (×6): qty 100

## 2015-04-22 NOTE — Progress Notes (Signed)
Hypoglycemic Event  CBG:69  Treatment: D50 IV 25 mL  Symptoms: Hungry  Follow-up CBG: [id number] CBG Result:134  Possible Reasons for Event: Inadequate meal intake  Comments/MD notified:K. Schorr,NP    [name] [name]

## 2015-04-22 NOTE — Progress Notes (Signed)
Initial Nutrition Assessment  DOCUMENTATION CODES:   Obesity unspecified  INTERVENTION:  Provide Boost Breeze po BID, each supplement provides 250 kcal and 9 grams of protein.  Encourage adequate PO intake.   NUTRITION DIAGNOSIS:   Increased nutrient needs related to chronic illness as evidenced by estimated needs.  GOAL:   Patient will meet greater than or equal to 90% of their needs  MONITOR:   Diet advancement, Supplement acceptance, PO intake, Skin, Weight trends, Labs, I & O's  REASON FOR ASSESSMENT:   Malnutrition Screening Tool    ASSESSMENT:   54 y.o. male with history of ESRD on hemodialysis on Monday Wednesday Friday, chronic systolic heart failure with severe mitral regurgitation, diabetes mellitus on no medications presently presents to the ER because of worsening abdominal pain. Patient's pain is mostly in the left lower quadrant. Patient has been having these symptoms since Friday 4 days ago. Has been having some nausea vomiting denies any diarrhea. Unable to keep in anything. In the ER CT abdomen and pelvis shows features consistent with sigmoid diverticulitis.  Pt reports having a decreased appetite with poor po intake which has been ongoing over the past 4 days. Pt reports prior to abdominal pains, he was eating well with at least 3 meals daily. Pt with no significant weight loss. Pt is currently on a clear liquid diet. RD to order Boost Breeze to aid in caloric and protein needs. Pt with no observed significant fat or muscle mass loss.   Potassium elevated at 6.4. Plans for HD tomorrow AM.   Diet Order:  Diet clear liquid Room service appropriate?: Yes; Fluid consistency:: Thin  Skin:  Reviewed, no issues  Last BM:  11/25  Height:   Ht Readings from Last 1 Encounters:  04/21/15 6\' 1"  (1.854 m)    Weight:   Wt Readings from Last 1 Encounters:  04/21/15 245 lb 12.8 oz (111.494 kg)    Ideal Body Weight:  83.6 kg  BMI:  Body mass index is 32.44  kg/(m^2).  Estimated Nutritional Needs:   Kcal:  2100-2300  Protein:  110-120 grams  Fluid:  Per MD  EDUCATION NEEDS:   No education needs identified at this time  Corrin Parker, MS, RD, LDN Pager # [phone] After hours/ weekend pager # [phone]

## 2015-04-22 NOTE — Progress Notes (Signed)
Received critical potassium level of 6.4,hemolized,K. Schorr,NP text paged.RN was advised to call Dr. Hal Hope as pt does not have H&P yet. Dr. Hal Hope notified. Order received and carried out. Incoming RN made aware. [name], Wonda Cheng, Therapist, sports

## 2015-04-22 NOTE — Progress Notes (Signed)
[facility] PROGRESS NOTE  [name] [id number] DOB: 1961-04-21 DOA: 04/21/2015 PCP: Maggie Font, MD  Interim summary [name] is a pleasant 54 year old gentleman with a past medical history of end-stage renal disease currently in hemodialysis Mondays, Wednesdays, Fridays, history of diverticulosis, admitted to the medicine service on 04/21/2015 when he presented with complaints of nausea vomiting associated with abdominal pain. CT scan of abdomen and pelvis revealed the presence of diverticulitis involving the mid to distal sigmoid colon without frank abscess or perforation. He was started on empiric IV antibiotic therapy with ciprofloxacin and Flagyl. This morning he reports feeling a little better, has not had further episodes of nausea vomiting however continues to experience abdominal pain.  Assessment/Plan: 1. Sigmoid diverticulitis. -Matthew. [name] is a 54 year old gentleman with history of end-stage renal disease who presented overnight to the emergency department with complaints of abdominal pain that was associated with nausea and vomiting. Workup included a CT scan of abdomen and pelvis that showed the presence of diverticulitis involving the mid to distal sigmoid colon. -Continue empiric IV antibiotic therapy with ciprofloxacin and Flagyl -CT scan did not reveal evidence of perforation or abscess. -On my examination this morning he is nontoxic appearing, awake and alert and states feeling a little better. -Will place patient on clears today. -Continue supportive care.  2.  End-stage renal disease. -He is currently on hemodialysis on Mondays, Wednesdays, Fridays. -Nephrology consulted, plan for HD in AM.  3.  Diabetes mellitus. -Off of hypoglycemics, continue monitoring blood sugars. -He is currently on clears.  4.   History of hypertension. -His blood pressures are in the low normal range this morning, will discontinue amlodipine. Place holding parameters on carvedilol  and isosorbide dinitrate. He is likely to be dehydrated from GI losses.   Code Status: Full Code Family Communication: Family not present Disposition Plan: Continue supportive care, plan for HD in am   Consultants:  Nephrology   Antibiotics:  Cipro  Flagyl  HPI/Subjective: He reports feeling a little better this am. Has not vomited since yesterday, though has ongoing abdominal pain.   Objective: Filed Vitals:   04/22/15 0543 04/22/15 0959  BP: 100/69 102/73  Pulse: 79 76  Temp: 98.3 F (36.8 C) 98.2 F (36.8 C)  Resp: 16 18    Intake/Output Summary (Last 24 hours) at 04/22/15 1456 Last data filed at 04/22/15 1300  Gross per 24 hour  Intake    780 ml  Output      2 ml  Net    778 ml   Filed Weights   04/21/15 1141 04/21/15 2040  Weight: 113.399 kg (250 lb) 111.494 kg (245 lb 12.8 oz)    Exam:   General:  He is awake and alert in no acute distress.   Cardiovascular: Regular rate and rhythm, normal S1S2  Respiratory: Normal inspiratory effort, no wheezing rhonchi or rales.   Abdomen: Patient having pain to palpation over the periumbilical region and left lower abdominal region  Musculoskeletal: No edema  Data Reviewed: Basic Metabolic Panel:  Recent Labs Lab 04/21/15 1150 04/22/15 0537  NA 137 133*  K 4.7 6.4*  CL 93* 90*  CO2 31 32  GLUCOSE 92 81  BUN 20 33*  CREATININE 8.72* 11.65*  CALCIUM 9.8 10.0   Liver Function Tests:  Recent Labs Lab 04/21/15 1150 04/22/15 0537  AST 9* 9*  ALT 9* 9*  ALKPHOS 88 85  BILITOT 1.3* 1.3*  PROT 8.7* 7.9  ALBUMIN 3.6 3.2*    Recent Labs  Lab 04/21/15 1150  LIPASE 24   No results for input(s): AMMONIA in the last 168 hours. CBC:  Recent Labs Lab 04/21/15 1150 04/22/15 0537  WBC 9.9 9.3  NEUTROABS  --  7.4  HGB 14.8 13.0  HCT 43.9 40.7  MCV 94.2 96.0  PLT 124* 140*   Cardiac Enzymes: No results for input(s): CKTOTAL, CKMB, CKMBINDEX, TROPONINI in the last 168 hours. BNP (last 3  results)  Recent Labs  01/05/15 0738 01/13/15 1431  BNP 1205.8* 1555.2*    ProBNP (last 3 results) No results for input(s): PROBNP in the last 8760 hours.  CBG:  Recent Labs Lab 04/22/15 0259 04/22/15 0542 04/22/15 0630 04/22/15 0733 04/22/15 1159  GLUCAP 129* 69 134* 80 71    Recent Results (from the past 240 hour(s))  MRSA PCR Screening     Status: None   Collection Time: 04/21/15 10:05 PM  Result Value Ref Range Status   MRSA by PCR NEGATIVE NEGATIVE Final    Comment:        The GeneXpert MRSA Assay (FDA approved for NASAL specimens only), is one component of a comprehensive MRSA colonization surveillance program. It is not intended to diagnose MRSA infection nor to guide or monitor treatment for MRSA infections.      Studies: Ct Abdomen Pelvis W Contrast  04/21/2015  ADDENDUM REPORT: 04/21/2015 18:58 ADD TO IMPRESSION: Patient is status post right nephrectomy. Cysts in left kidney are consistent with chronic renal failure. Electronically Signed   By: Lowella Grip III M.D.   On: 04/21/2015 18:58  04/21/2015  CLINICAL DATA:  Five day history of left lower quadrant abdominal pain. Chronic renal failure. EXAM: CT ABDOMEN AND PELVIS WITH CONTRAST TECHNIQUE: Multidetector CT imaging of the abdomen and pelvis was performed using the standard protocol following bolus administration of intravenous contrast. Oral contrast was also administered. CONTRAST:  25mL OMNIPAQUE IOHEXOL 300 MG/ML  SOLN COMPARISON:  December 18, 2014 FINDINGS: Lower chest: Visualized lung bases are clear. Heart is mildly enlarged with small pericardial effusion. Hepatobiliary: A previously noted focus of decreased attenuation with peripheral enhancement in the left lobe of the liver is not appreciably changed compared to prior studies. The decreased attenuation central region measures 1 cm with approximately 2.4 x 2.0 cm of peripheral enhancement. No new liver lesions are identified. Gallbladder is  borderline distended without gallbladder wall thickening. There is no biliary duct dilatation. Pancreas: No mass or inflammatory focus. Spleen: No splenic lesions are identified. Adrenals/Urinary Tract: Right adrenal appears normal. Mild left adrenal hypertrophy is stable. Patient is status post right nephrectomy. There are multiple subcentimeter cysts throughout the left kidney. There is prominence of the renal sinus fat in the left kidney. There is no demonstrable hydronephrosis on the left. No renal or ureteral calculus is appreciable. Urinary bladder is completely collapsed. Stomach/Bowel: There is wall thickening in the mid to distal descending colon with surrounding mesenteric inflammation and several irregular diverticula consistent with diverticulitis. There is no frank abscess or perforation in this area of diverticulitis. There is no bowel obstruction. No free air or portal venous air. Vascular/Lymphatic: There is no abdominal aortic aneurysm. There are scattered foci of atherosclerotic calcification. The major mesenteric vessels appear patent. There is no adenopathy in the abdomen or pelvis. Reproductive: Prostate and seminal vesicles are normal in size and contour. No pelvic mass or pelvic fluid collection. Other: The appendix appears normal. There is no abscess or ascites in the abdomen or pelvis. There is a moderate ventral hernia  containing fat but no bowel. There is fat in the left inguinal ring. Musculoskeletal: There is degenerative change in the lumbar spine, most marked at L4-5. There are scattered probable bone islands. There are no lytic or destructive bone lesions. No intramuscular lesions are identified. IMPRESSION: Diverticulitis involving the mid to distal sigmoid colon without frank abscess or perforation. There is extensive mesenteric thickening surrounding this area of diverticulitis. The wall of the mid to distal sigmoid colon is thickened, and there are several irregular diverticula in  this area. Small pericardial effusion.  Mild cardiomegaly. Lesion left lobe of liver, noted previously which has a central area of decreased attenuation and peripheral enhancement. Suspect atypical hemangioma. Particular attention to this area on subsequent evaluations advised. Stable left adrenal hypertrophy. Moderate ventral hernia containing fat but no bowel. Electronically Signed: By: Lowella Grip III M.D. On: 04/21/2015 18:40    Scheduled Meds: . amLODipine  10 mg Oral QPC lunch  . aspirin  325 mg Oral Daily  . calcium acetate  1,334 mg Oral TID WC  . carvedilol  12.5 mg Oral BID WC  . ciprofloxacin  400 mg Intravenous Q24H  . insulin aspart  0-9 Units Subcutaneous Q4H  . isosorbide dinitrate  20 mg Oral Daily  . metronidazole  500 mg Intravenous Q8H  . pantoprazole  40 mg Oral Q1200  . sodium chloride  3 mL Intravenous Q12H   Continuous Infusions:   Principal Problem:   Diverticulitis of intestine without perforation or abscess without bleeding Active Problems:   Systolic CHF, chronic (HCC)   Hypertension, benign   DM II (diabetes mellitus, type II), controlled ([place])   Gout, chronic   End-stage renal disease on hemodialysis ([place])   Acute diverticulitis    Time spent: 30 min    [name]  [facility] Pager [phone]. If 7PM-7AM, please contact night-coverage at [email or web address], password [facility] 04/22/2015, 2:56 PM  LOS: 1 day

## 2015-04-22 NOTE — Progress Notes (Signed)
CRITICAL VALUE ALERT  Critical value received:  Potassium level=6.4,hemolized  Date of notification:  04/22/15  Time of notification:  0637  Critical value read back:Yes.    Nurse who received alert:  Daphene Calamity  MD notified (1st page):  K. Schorr,NP,[place],MD  Time of first page: [phone] MD notified (2nd page):  Time of second page:  Responding MD:  Kern Reap Time MD responded: [phone]

## 2015-04-22 NOTE — Consult Note (Signed)
Renal Service Consult Note [facility]  [name] 04/22/2015 [place] Requesting Physician:  Dr Coralyn Pear  Reason for Consult:  ESRD patient w abd pain HPI: The patient is a 54 y.o. year-old with hx of HTN, CM, gout, MI, CVA w/o residual, and ESRD on HD who presented to ED with abd pain, LLQ, and some n/v.  CT in ED showed sigmoid diverticulitis and pt was admitted. No CT evidence of perf / abscess.    Currently reports LLQ abd pain, no f/c/s, no CP, SOB, HA, rash, joint pains  Past Medical History  Past Medical History  Diagnosis Date  . CHF (congestive heart failure) ([place])   . History of unilateral nephrectomy   . Hypertension   . Cardiomyopathy   . Gout   . Myocardial infarction [facility]) ?2006  . Shortness of breath 05/19/11    "at rest, lying down, w/exertion"  . Umbilical hernia [id number]    unrepaired  . Stroke [facility]) 02/2011    05/19/11 denies residual  . Dialysis patient ([place])     Mon-Wed-Fri  . Diabetes mellitus     pt. states he's borderline diabetic., no longer taking med,- off med. since 2013  . Renal cell carcinoma     dialysis- MWF  . ESRD (end stage renal disease) ([place])   . Coronary artery disease     normal coronaries by 10/10/08 cath  . History of nephrectomy 07/04/2012    History of right nephrectomy in 2002 for renal cell carcinoma   . 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 Renal insufficiency    Past Surgical History  Past Surgical History  Procedure Laterality Date  . Nephrectomy  2000    right  . Av fistula placement  03/29/2011    Procedure: ARTERIOVENOUS (AV) FISTULA CREATION;  Surgeon: Hinda Lenis, MD;  Location: [place];  Service: Vascular;  Laterality: Left;  LEFT RADIOCEPHALIC , Arteriovenous [id number])  . Smashed  1990's    "left pinky; have a plate in there"  . US echocardiography  05/20/11  . Cardiac catheterization  05/21/11  . Finger surgery      L pinkie finger- ORIF- /w remaining  hardware - 1990's    . Umbilical hernia repair N/A 07/04/2012    Procedure: LAPAROSCOPIC UMBILICAL HERNIA;  Surgeon: Madilyn Hook, DO;  Location: [place];  Service: General;  Laterality: N/A;  . Insertion of mesh N/A 07/04/2012    Procedure: INSERTION OF MESH;  Surgeon: Madilyn Hook, DO;  Location: [place];  Service: General;  Laterality: N/A;  . Hernia repair N/A [id number]    Umbilical hernia repair  . Dialysis cath placed    . Revison of arteriovenous fistula Left 06/05/2013    Procedure: REVISON OF LEFT RADIOCEPHALIC  ARTERIOVENOUS FISTULA;  Surgeon: Conrad , MD;  Location: [place];  Service: Vascular;  Laterality: Left;  . Right heart catheterization N/A 05/21/2011    Procedure: RIGHT HEART CATH;  Surgeon: Birdie Riddle, MD;  Location: [facility] CATH LAB;  Service: Cardiovascular;  Laterality: N/A;  . Left and right heart catheterization with coronary angiogram N/A 08/04/2012    Procedure: LEFT AND RIGHT HEART CATHETERIZATION WITH CORONARY ANGIOGRAM;  Surgeon: Birdie Riddle, MD;  Location: [place] CATH LAB;  Service: Cardiovascular;  Laterality: N/A;   Family History  Family History  Problem Relation Age of Onset  . Hypertension Mother   . Kidney disease Mother    Social History  reports that he has never smoked. He has never used smokeless tobacco. He reports that he does not drink alcohol or use illicit drugs. Allergies  Allergies  Allergen Reactions  . Ace Inhibitors Cough   Home medications Prior to Admission medications   Medication Sig Start Date End Date Taking? Authorizing Provider  amLODipine (NORVASC) 10 MG tablet Take 10 mg by mouth daily after lunch.    Yes Historical Provider, MD  aspirin 325 MG tablet Take 325 mg by mouth daily.    Yes Historical Provider, MD  b complex-vitamin c-folic acid (NEPHRO-VITE) 0.8 MG TABS tablet Take 1 tablet by mouth at bedtime.   Yes Historical Provider, MD  calcium acetate (PHOSLO) 667 MG capsule Take 667-2,001 mg by mouth 5 (five) times daily. Takes  2 capsules three times a day with meals.  Also takes 1-3 capsule with snacks, dose depends on what the snack is   Yes Historical Provider, MD  carvedilol (COREG) 25 MG tablet Take 0.5 tablets (12.5 mg total) by mouth 2 (two) times daily with a meal. 01/16/15  Yes Nishant Dhungel, MD  Homeopathic Products (ARTHRITIS RELIEF PO) Take 2 tablets by mouth daily as needed (for pain).   Yes Historical Provider, MD  isosorbide dinitrate (ISORDIL) 20 MG tablet Take 20 mg by mouth daily.    Yes Historical Provider, MD  lidocaine-prilocaine (EMLA) cream Apply 1 application topically every Monday, Wednesday, and Friday. 04/08/15  Yes Historical Provider, MD  Nutritional Supplements (FEEDING SUPPLEMENT, NEPRO CARB STEADY,) LIQD Take 237 mLs by mouth 2 (two) times daily between meals. 01/16/15  Yes Nishant Dhungel, MD  pantoprazole (PROTONIX) 40 MG tablet Take 1 tablet (40 mg total) by mouth daily at 12 noon. 01/16/15  Yes Nishant Dhungel, MD  oxyCODONE-acetaminophen (PERCOCET/ROXICET) 5-325 MG per tablet Take 1 tablet by mouth at bedtime as needed for moderate pain. 12/20/14   Cristal Ford, DO   Liver Function Tests  Recent Labs Lab 04/21/15 1150 04/22/15 0537  AST 9* 9*  ALT 9* 9*  ALKPHOS 88 85  BILITOT 1.3* 1.3*  PROT 8.7* 7.9  ALBUMIN 3.6 3.2*    Recent Labs Lab 04/21/15 1150  LIPASE 24   CBC  Recent Labs Lab 04/21/15 1150 04/22/15 0537  WBC 9.9 9.3  NEUTROABS  --  7.4  HGB 14.8 13.0  HCT 43.9 40.7  MCV 94.2 96.0  PLT 124* [id number]*   Basic Metabolic Panel  Recent Labs Lab 04/21/15 1150 04/22/15 0537  NA 137 133*  K 4.7 6.4*  CL 93* 90*  CO2 31 32  GLUCOSE 92 81  BUN 20 33*  CREATININE 8.72* 11.65*  CALCIUM 9.8 10.0    Filed Vitals:   04/21/15 2015 04/21/15 2040 04/22/15 0543 04/22/15 0959  BP: 121/75 108/62 100/69 102/73  Pulse: 93 96 79 76  Temp:  98.7 F (37.1 C) 98.3 F (36.8 C) 98.2 F (36.8 C)  TempSrc:  Oral Oral Oral  Resp:  16 16 18   Height:  6\' 1"  (1.854  m)    Weight:  111.494 kg (245 lb 12.8 oz)    SpO2: 97% 94% 98% 95%   Exam Alert, no distress, calm No rash, cyanosis or gangrene Sclera anicteric, throat clear No JVD Chest clear bilat RRR no MRG Abd soft ntnd no mass or ascites GU normal male Ext no LE or UE edema LFA AVF +bruit Neuro nf, O x3  [place] MWF   4h  111.5kg  2/2.25 bath  LFA AVF   Heparin  12,000 then 4000 midRx Hect 4 ug / hd Lab - hb 12.8, Ca 9.6, P 6.9, pth 1212    Assessment: 1 Abd pain / diverticulitis 2 ESRD 3 HTN amlod/ coreg 4 CM EF 25-30% on imdur; cath neg 5 Anemia no esa Hb good 6 MBD cont meds   Plan- HD Wed, minimal UF  Kelly Splinter MD [place] pager [phone]    cell [phone] 04/22/2015, 2:55 PM

## 2015-04-22 NOTE — Progress Notes (Signed)
ANTIBIOTIC CONSULT NOTE - INITIAL  Pharmacy Consult for Cipro  Indication: Intra-abdominal infection  Allergies  Allergen Reactions  . Ace Inhibitors Cough    Patient Measurements: Height: 6\' 1"  (185.4 cm) Weight: 245 lb 12.8 oz (111.494 kg) IBW/kg (Calculated) : 79.9  Vital Signs: Temp: 98.3 F (36.8 C) (11/29 0543) Temp Source: Oral (11/29 0543) BP: 100/69 mmHg (11/29 0543) Pulse Rate: 79 (11/29 0543)  Labs:  Recent Labs  04/21/15 1150 04/22/15 0537  WBC 9.9 9.3  HGB 14.8 13.0  PLT 124* 140*  CREATININE 8.72* 11.65*   Estimated Creatinine Clearance: 9.7 mL/min (by C-G formula based on Cr of 11.65).   Microbiology: Recent Results (from the past 720 hour(s))  MRSA PCR Screening     Status: None   Collection Time: 04/21/15 10:05 PM  Result Value Ref Range Status   MRSA by PCR NEGATIVE NEGATIVE Final    Comment:        The GeneXpert MRSA Assay (FDA approved for NASAL specimens only), is one component of a comprehensive MRSA colonization surveillance program. It is not intended to diagnose MRSA infection nor to guide or monitor treatment for MRSA infections.     Medical History: Past Medical History  Diagnosis Date  . CHF (congestive heart failure) ([place])   . History of unilateral nephrectomy   . Hypertension   . Cardiomyopathy   . Gout   . Myocardial infarction [facility]) ?2006  . Shortness of breath 05/19/11    "at rest, lying down, w/exertion"  . Umbilical hernia [id number]    unrepaired  . Stroke [facility]) 02/2011    05/19/11 denies residual  . Dialysis patient ([place])     Mon-Wed-Fri  . Diabetes mellitus     pt. states he's borderline diabetic., no longer taking med,- off med. since 2013  . Renal cell carcinoma     dialysis- MWF  . ESRD (end stage renal disease) ([place])   . Coronary artery disease     normal coronaries by 10/10/08 cath  . History of nephrectomy 07/04/2012    History of right nephrectomy in 2002 for renal cell carcinoma   . Blind right  eye     Hx: of partial blindness in right eye  . Renal insufficiency     Assessment: 54 y/o M with LLQ pain, pt has ESRD on HD, WBC WNL  Plan:  -Cipro 400 mg IV q24h -Flagyl per MD -F/U infectious work-up  Narda Bonds 04/22/2015,7:15 AM

## 2015-04-22 NOTE — Progress Notes (Signed)
Hypoglycemic Event  CBG: 68  Treatment: D50 IV 25 mL  Symptoms: Hungry  Follow-up CBG: [id number] CBG Result: 129  Possible Reasons for Event: Inadequate meal intake  Comments/MD notified:per hypoglycemia protocol    Karthikeya Funke Joselita

## 2015-04-23 DIAGNOSIS — N186 End stage renal disease: Secondary | ICD-10-CM

## 2015-04-23 DIAGNOSIS — E1122 Type 2 diabetes mellitus with diabetic chronic kidney disease: Secondary | ICD-10-CM

## 2015-04-23 DIAGNOSIS — Z992 Dependence on renal dialysis: Secondary | ICD-10-CM

## 2015-04-23 DIAGNOSIS — K5792 Diverticulitis of intestine, part unspecified, without perforation or abscess without bleeding: Secondary | ICD-10-CM

## 2015-04-23 DIAGNOSIS — I5022 Chronic systolic (congestive) heart failure: Secondary | ICD-10-CM

## 2015-04-23 DIAGNOSIS — I120 Hypertensive chronic kidney disease with stage 5 chronic kidney disease or end stage renal disease: Secondary | ICD-10-CM | POA: Diagnosis not present

## 2015-04-23 LAB — CBC
HCT: 37.2 % — ABNORMAL LOW (ref 39.0–52.0)
Hemoglobin: 12.2 g/dL — ABNORMAL LOW (ref 13.0–17.0)
MCH: 31.1 pg (ref 26.0–34.0)
MCHC: 32.8 g/dL (ref 30.0–36.0)
MCV: 94.9 fL (ref 78.0–100.0)
Platelets: 132 10*3/uL — ABNORMAL LOW (ref 150–400)
RBC: 3.92 MIL/uL — ABNORMAL LOW (ref 4.22–5.81)
RDW: 15.6 % — ABNORMAL HIGH (ref 11.5–15.5)
WBC: 5.8 10*3/uL (ref 4.0–10.5)

## 2015-04-23 LAB — BASIC METABOLIC PANEL
Anion gap: 11 (ref 5–15)
BUN: 47 mg/dL — ABNORMAL HIGH (ref 6–20)
CO2: 30 mmol/L (ref 22–32)
Calcium: 9.5 mg/dL (ref 8.9–10.3)
Chloride: 86 mmol/L — ABNORMAL LOW (ref 101–111)
Creatinine, Ser: 13.78 mg/dL — ABNORMAL HIGH (ref 0.61–1.24)
GFR calc Af Amer: 4 mL/min — ABNORMAL LOW (ref >60)
GFR calc non Af Amer: 4 mL/min — ABNORMAL LOW (ref >60)
Glucose, Bld: 72 mg/dL (ref 65–99)
Potassium: 6.1 mmol/L (ref 3.5–5.1)
Sodium: 127 mmol/L — ABNORMAL LOW (ref 135–145)

## 2015-04-23 LAB — GLUCOSE, CAPILLARY
Glucose-Capillary: 114 mg/dL — ABNORMAL HIGH (ref 65–99)
Glucose-Capillary: 121 mg/dL — ABNORMAL HIGH (ref 65–99)
Glucose-Capillary: 73 mg/dL (ref 65–99)
Glucose-Capillary: 75 mg/dL (ref 65–99)
Glucose-Capillary: 80 mg/dL (ref 65–99)
Glucose-Capillary: 82 mg/dL (ref 65–99)

## 2015-04-23 LAB — HEPATITIS B SURFACE ANTIGEN: Hepatitis B Surface Ag: NEGATIVE

## 2015-04-23 LAB — POTASSIUM: Potassium: 4.1 mmol/L (ref 3.5–5.1)

## 2015-04-23 MED ORDER — HYDROMORPHONE HCL 1 MG/ML IJ SOLN
INTRAMUSCULAR | Status: AC
  Filled 2015-04-23: qty 1

## 2015-04-23 MED ORDER — SODIUM CHLORIDE 0.9 % IV SOLN
100.0000 mL | INTRAVENOUS | Status: DC | PRN
Start: 2015-04-23 — End: 2015-04-23

## 2015-04-23 MED ORDER — HEPARIN SODIUM (PORCINE) 1000 UNIT/ML DIALYSIS: 4000.0000 [IU] | INTRAMUSCULAR | Status: DC | PRN

## 2015-04-23 MED ORDER — SODIUM CHLORIDE 0.9 % IV SOLN: 100.0000 mL | INTRAVENOUS | Status: DC | PRN

## 2015-04-23 MED ORDER — DOXERCALCIFEROL 4 MCG/2ML IV SOLN
INTRAVENOUS | Status: AC
  Filled 2015-04-23: qty 2

## 2015-04-23 MED ORDER — LIDOCAINE-PRILOCAINE 2.5-2.5 % EX CREA
1.0000 "application " | TOPICAL_CREAM | CUTANEOUS | Status: DC | PRN
  Filled 2015-04-23: qty 5

## 2015-04-23 MED ORDER — LIDOCAINE HCL (PF) 1 % IJ SOLN: 5.0000 mL | INTRAMUSCULAR | Status: DC | PRN

## 2015-04-23 MED ORDER — HEPARIN SODIUM (PORCINE) 5000 UNIT/ML IJ SOLN
5000.0000 [IU] | Freq: Three times a day (TID) | INTRAMUSCULAR | Status: DC
Start: 2015-04-23 — End: 2015-04-24
  Administered 2015-04-23: 5000 [IU] via SUBCUTANEOUS
  Filled 2015-04-23: qty 1

## 2015-04-23 MED ORDER — ALTEPLASE 2 MG IJ SOLR
2.0000 mg | Freq: Once | INTRAMUSCULAR | Status: DC | PRN
  Filled 2015-04-23: qty 2

## 2015-04-23 MED ORDER — PENTAFLUOROPROP-TETRAFLUOROETH EX AERO: 1.0000 "application " | INHALATION_SPRAY | CUTANEOUS | Status: DC | PRN

## 2015-04-23 MED ORDER — HEPARIN SODIUM (PORCINE) 1000 UNIT/ML DIALYSIS: 1000.0000 [IU] | INTRAMUSCULAR | Status: DC | PRN

## 2015-04-23 NOTE — Progress Notes (Signed)
CRITICAL VALUE ALERT  Critical value received:  Potassium 6.1  Date of notification: 04/23/15  Time of notification:  07:43 am  Critical value read back:Yes.    Nurse who received alert:  Alden Server   MD notified (1st page):  Domenic Polite  Time of first page:  07:47 MD notified (2nd page): N/A  Time of second page: N/A  Responding MD:  Domenic Polite  Time MD responded:  07:48 am

## 2015-04-23 NOTE — Progress Notes (Signed)
Subjective:  Sipping some breakfast liquids="slightly better"/ for hd this am   Objective Vital signs in last 24 hours: Filed Vitals:   04/22/15 1800 04/22/15 2056 04/23/15 0040 04/23/15 0451  BP: 115/70 101/70 113/73 101/59  Pulse: 72 74 71 72  Temp: 97.9 F (36.6 C) 98.2 F (36.8 C) 98.7 F (37.1 C) 98.1 F (36.7 C)  TempSrc: Oral Oral Oral Oral  Resp: 18 18 18 17   Height:      Weight:  113.399 kg (250 lb)    SpO2: 98% 97% 98% 96%   Weight change: 0 kg (0 lb)  Physical Exam: General: alert NAD.appropriate Heart: RRR soft 1/6 sem lsb no R /G Lungs: CTA Abdomen: BS pos, , soft LL quad tend / ND Extremities: no pedal edema Dialysis Access: pos bruit L FA AVF   [place] MWF 4h 111.5kg 2/2.25 bath LFA AVF Heparin 12,000 then 4000 midRx Hect 4 ug / hd Lab - hb 12.8, Ca 9.6, P 6.9, pth 1212  Problem/Plan: 1 Abd pain / diverticulitis= IV antib. /admit team RX/  2 ESRD= Yest k 6.4 hemolyzed / K 6.1 this am ="ate some potatoes in soup before admit" HD today LOw k bath , fu am k  HD WMF schedule  3 HTN = 113/73 bp am/ was on amlod/ coreg as op / now on hold with lowish bp 4 CM EF 25-30% =on imdur; cath neg 5 Anemia = HGB 12.2 and no esa Hb good 6 MBD=ck labs  /cont meds= phoslo and hectorol 105mcg qhd   Ernest Haber, PA-C [facility] Beeper [phone] 04/23/2015,8:33 AM  LOS: 2 days   Pt seen, examined and agree w A/P as above.  Kelly Splinter MD [facility] pager [phone]    cell [phone] 04/23/2015, 11:31 AM    Labs: Basic Metabolic Panel:  Recent Labs Lab 04/21/15 1150 04/22/15 0537 04/23/15 0634  NA 137 133* 127*  K 4.7 6.4* 6.1*  CL 93* 90* 86*  CO2 31 32 30  GLUCOSE 92 81 72  BUN 20 33* 47*  CREATININE 8.72* 11.65* 13.78*  CALCIUM 9.8 10.0 9.5   Liver Function Tests:  Recent Labs Lab 04/21/15 1150 04/22/15 0537  AST 9* 9*  ALT 9* 9*  ALKPHOS 88 85  BILITOT 1.3* 1.3*  PROT 8.7* 7.9  ALBUMIN 3.6 3.2*     Recent Labs Lab 04/21/15 1150  LIPASE 24   No results for input(s): AMMONIA in the last 168 hours. CBC:  Recent Labs Lab 04/21/15 1150 04/22/15 0537 04/23/15 0634  WBC 9.9 9.3 5.8  NEUTROABS  --  7.4  --   HGB 14.8 13.0 12.2*  HCT 43.9 40.7 37.2*  MCV 94.2 96.0 94.9  PLT 124* 140* 132*   Cardiac Enzymes: No results for input(s): CKTOTAL, CKMB, CKMBINDEX, TROPONINI in the last 168 hours. CBG:  Recent Labs Lab 04/22/15 1604 04/22/15 2043 04/23/15 0005 04/23/15 0448 04/23/15 0734  GLUCAP 76 90 73 75 80     Medications:   . aspirin  325 mg Oral Daily  . calcium acetate  1,334 mg Oral TID WC  . carvedilol  12.5 mg Oral BID WC  . ciprofloxacin  400 mg Intravenous Q24H  . doxercalciferol  4 mcg Intravenous Q M,W,F-HD  . feeding supplement  1 Container Oral BID BM  . heparin subcutaneous  5,000 Units Subcutaneous 3 times per day  . insulin aspart  0-9 Units Subcutaneous Q4H  . isosorbide dinitrate  20 mg Oral Daily  .  metronidazole  500 mg Intravenous Q8H  . pantoprazole  40 mg Oral Q1200  . sodium chloride  3 mL Intravenous Q12H

## 2015-04-23 NOTE — Progress Notes (Signed)
[facility] PROGRESS NOTE  [name] [id number] DOB: 01/29/1961 DOA: 04/21/2015 PCP: Maggie Font, MD  Interim summary [name] is a pleasant 54 year old gentleman with a past medical history of end-stage renal disease currently in hemodialysis Mondays, Wednesdays, Fridays, history of diverticulosis, admitted to the medicine service on 04/21/2015 when he presented with complaints of nausea vomiting associated with abdominal pain. CT scan of abdomen and pelvis revealed the presence of diverticulitis involving the mid to distal sigmoid colon without frank abscess or perforation. He was started on empiric IV antibiotic therapy with ciprofloxacin and Flagyl. This morning he reports feeling a little better, has not had further episodes of nausea vomiting however continues to experience abdominal pain.  Assessment/Plan: 1. Sigmoid diverticulitis. -Continue empiric IV antibiotic therapy with ciprofloxacin and Flagyl -CT scan did not reveal evidence of perforation or abscess. -improving, advance to full liq diet, keep ABx IV today -home tomorrow if stable  2.  End-stage renal disease. -On hemodialysis on Mondays, Wednesdays, Fridays. -Nephrology following  3.  Diabetes mellitus. -Off of hypoglycemics, continue monitoring blood sugars. -stable, SSI  4.   History of hypertension. -His blood pressures are in the low normal range this morning, stopped amlodipine.  -continue carvedilol and isosorbide dinitrate  DVT proph: Hep SQ  Code Status: Full Code Family Communication: Family not present Disposition Plan: home tomorrow if improved   Consultants:  Nephrology   Antibiotics:  Cipro  Flagyl  HPI/Subjective: Improving, still with abd pain, no N/V  Objective: Filed Vitals:   04/23/15 1130 04/23/15 1241  BP: 110/75 123/83  Pulse: 68 72  Temp:  98.2 F (36.8 C)  Resp: 18 23    Intake/Output Summary (Last 24 hours) at 04/23/15 1411 Last data filed at 04/23/15  1241  Gross per 24 hour  Intake   1040 ml  Output   1850 ml  Net   -810 ml   Filed Weights   04/22/15 2056 04/23/15 0845 04/23/15 1241  Weight: 113.399 kg (250 lb) 113.3 kg (249 lb 12.5 oz) 112.2 kg (247 lb 5.7 oz)    Exam:   General: AAOx3, no distress.   Cardiovascular: Regular rate and rhythm, normal S1S2  Respiratory: Normal inspiratory effort, no wheezing rhonchi or rales.   Abdomen: soft, LLQ tender, BS present  Musculoskeletal: No edema  Data Reviewed: Basic Metabolic Panel:  Recent Labs Lab 04/21/15 1150 04/22/15 0537 04/23/15 0634  NA 137 133* 127*  K 4.7 6.4* 6.1*  CL 93* 90* 86*  CO2 31 32 30  GLUCOSE 92 81 72  BUN 20 33* 47*  CREATININE 8.72* 11.65* 13.78*  CALCIUM 9.8 10.0 9.5   Liver Function Tests:  Recent Labs Lab 04/21/15 1150 04/22/15 0537  AST 9* 9*  ALT 9* 9*  ALKPHOS 88 85  BILITOT 1.3* 1.3*  PROT 8.7* 7.9  ALBUMIN 3.6 3.2*    Recent Labs Lab 04/21/15 1150  LIPASE 24   No results for input(s): AMMONIA in the last 168 hours. CBC:  Recent Labs Lab 04/21/15 1150 04/22/15 0537 04/23/15 0634  WBC 9.9 9.3 5.8  NEUTROABS  --  7.4  --   HGB 14.8 13.0 12.2*  HCT 43.9 40.7 37.2*  MCV 94.2 96.0 94.9  PLT 124* 140* 132*   Cardiac Enzymes: No results for input(s): CKTOTAL, CKMB, CKMBINDEX, TROPONINI in the last 168 hours. BNP (last 3 results)  Recent Labs  01/05/15 0738 01/13/15 1431  BNP 1205.8* 1555.2*    ProBNP (last 3 results) No results for input(s):  PROBNP in the last 8760 hours.  CBG:  Recent Labs Lab 04/22/15 2043 04/23/15 0005 04/23/15 0448 04/23/15 0734 04/23/15 1308  GLUCAP 90 73 75 80 82    Recent Results (from the past 240 hour(s))  MRSA PCR Screening     Status: None   Collection Time: 04/21/15 10:05 PM  Result Value Ref Range Status   MRSA by PCR NEGATIVE NEGATIVE Final    Comment:        The GeneXpert MRSA Assay (FDA approved for NASAL specimens only), is one component of  a comprehensive MRSA colonization surveillance program. It is not intended to diagnose MRSA infection nor to guide or monitor treatment for MRSA infections.      Studies: Ct Abdomen Pelvis W Contrast  04/21/2015  ADDENDUM REPORT: 04/21/2015 18:58 ADD TO IMPRESSION: Patient is status post right nephrectomy. Cysts in left kidney are consistent with chronic renal failure. Electronically Signed   By: Lowella Grip III M.D.   On: 04/21/2015 18:58  04/21/2015  CLINICAL DATA:  Five day history of left lower quadrant abdominal pain. Chronic renal failure. EXAM: CT ABDOMEN AND PELVIS WITH CONTRAST TECHNIQUE: Multidetector CT imaging of the abdomen and pelvis was performed using the standard protocol following bolus administration of intravenous contrast. Oral contrast was also administered. CONTRAST:  16mL OMNIPAQUE IOHEXOL 300 MG/ML  SOLN COMPARISON:  December 18, 2014 FINDINGS: Lower chest: Visualized lung bases are clear. Heart is mildly enlarged with small pericardial effusion. Hepatobiliary: A previously noted focus of decreased attenuation with peripheral enhancement in the left lobe of the liver is not appreciably changed compared to prior studies. The decreased attenuation central region measures 1 cm with approximately 2.4 x 2.0 cm of peripheral enhancement. No new liver lesions are identified. Gallbladder is borderline distended without gallbladder wall thickening. There is no biliary duct dilatation. Pancreas: No mass or inflammatory focus. Spleen: No splenic lesions are identified. Adrenals/Urinary Tract: Right adrenal appears normal. Mild left adrenal hypertrophy is stable. Patient is status post right nephrectomy. There are multiple subcentimeter cysts throughout the left kidney. There is prominence of the renal sinus fat in the left kidney. There is no demonstrable hydronephrosis on the left. No renal or ureteral calculus is appreciable. Urinary bladder is completely collapsed. Stomach/Bowel:  There is wall thickening in the mid to distal descending colon with surrounding mesenteric inflammation and several irregular diverticula consistent with diverticulitis. There is no frank abscess or perforation in this area of diverticulitis. There is no bowel obstruction. No free air or portal venous air. Vascular/Lymphatic: There is no abdominal aortic aneurysm. There are scattered foci of atherosclerotic calcification. The major mesenteric vessels appear patent. There is no adenopathy in the abdomen or pelvis. Reproductive: Prostate and seminal vesicles are normal in size and contour. No pelvic mass or pelvic fluid collection. Other: The appendix appears normal. There is no abscess or ascites in the abdomen or pelvis. There is a moderate ventral hernia containing fat but no bowel. There is fat in the left inguinal ring. Musculoskeletal: There is degenerative change in the lumbar spine, most marked at L4-5. There are scattered probable bone islands. There are no lytic or destructive bone lesions. No intramuscular lesions are identified. IMPRESSION: Diverticulitis involving the mid to distal sigmoid colon without frank abscess or perforation. There is extensive mesenteric thickening surrounding this area of diverticulitis. The wall of the mid to distal sigmoid colon is thickened, and there are several irregular diverticula in this area. Small pericardial effusion.  Mild cardiomegaly. Lesion left  lobe of liver, noted previously which has a central area of decreased attenuation and peripheral enhancement. Suspect atypical hemangioma. Particular attention to this area on subsequent evaluations advised. Stable left adrenal hypertrophy. Moderate ventral hernia containing fat but no bowel. Electronically Signed: By: Lowella Grip III M.D. On: 04/21/2015 18:40    Scheduled Meds: . aspirin  325 mg Oral Daily  . calcium acetate  1,334 mg Oral TID WC  . carvedilol  12.5 mg Oral BID WC  . ciprofloxacin  400 mg  Intravenous Q24H  . doxercalciferol  4 mcg Intravenous Q M,W,F-HD  . feeding supplement  1 Container Oral BID BM  . heparin subcutaneous  5,000 Units Subcutaneous 3 times per day  . insulin aspart  0-9 Units Subcutaneous Q4H  . isosorbide dinitrate  20 mg Oral Daily  . metronidazole  500 mg Intravenous Q8H  . pantoprazole  40 mg Oral Q1200  . sodium chloride  3 mL Intravenous Q12H   Continuous Infusions:   Principal Problem:   Diverticulitis of intestine without perforation or abscess without bleeding Active Problems:   Systolic CHF, chronic (HCC)   Hypertension, benign   DM II (diabetes mellitus, type II), controlled ([place])   Gout, chronic   End-stage renal disease on hemodialysis ([place])   Acute diverticulitis    Time spent: 30 min    Tiberius Loftus  [facility] Pager [phone] If 7PM-7AM, please contact night-coverage at [email or web address], password [facility] 04/23/2015, 2:11 PM  LOS: 2 days

## 2015-04-24 DIAGNOSIS — I10 Essential (primary) hypertension: Secondary | ICD-10-CM

## 2015-04-24 LAB — BASIC METABOLIC PANEL
Anion gap: 13 (ref 5–15)
BUN: 24 mg/dL — ABNORMAL HIGH (ref 6–20)
CO2: 26 mmol/L (ref 22–32)
Calcium: 9.5 mg/dL (ref 8.9–10.3)
Chloride: 92 mmol/L — ABNORMAL LOW (ref 101–111)
Creatinine, Ser: 9.63 mg/dL — ABNORMAL HIGH (ref 0.61–1.24)
GFR calc Af Amer: 6 mL/min — ABNORMAL LOW (ref >60)
GFR calc non Af Amer: 5 mL/min — ABNORMAL LOW (ref >60)
Glucose, Bld: 79 mg/dL (ref 65–99)
Potassium: 4.7 mmol/L (ref 3.5–5.1)
Sodium: 131 mmol/L — ABNORMAL LOW (ref 135–145)

## 2015-04-24 LAB — CBC
HCT: 34.8 % — ABNORMAL LOW (ref 39.0–52.0)
Hemoglobin: 11.2 g/dL — ABNORMAL LOW (ref 13.0–17.0)
MCH: 30.4 pg (ref 26.0–34.0)
MCHC: 32.2 g/dL (ref 30.0–36.0)
MCV: 94.6 fL (ref 78.0–100.0)
Platelets: 133 10*3/uL — ABNORMAL LOW (ref 150–400)
RBC: 3.68 MIL/uL — ABNORMAL LOW (ref 4.22–5.81)
RDW: 15.6 % — ABNORMAL HIGH (ref 11.5–15.5)
WBC: 3.2 10*3/uL — ABNORMAL LOW (ref 4.0–10.5)

## 2015-04-24 LAB — GLUCOSE, CAPILLARY
Glucose-Capillary: 105 mg/dL — ABNORMAL HIGH (ref 65–99)
Glucose-Capillary: 70 mg/dL (ref 65–99)
Glucose-Capillary: 88 mg/dL (ref 65–99)
Glucose-Capillary: 95 mg/dL (ref 65–99)

## 2015-04-24 MED ORDER — METRONIDAZOLE 500 MG PO TABS: 500.0000 mg | ORAL_TABLET | Freq: Three times a day (TID) | ORAL | Status: DC

## 2015-04-24 MED ORDER — CIPROFLOXACIN HCL 500 MG PO TABS: 500.0000 mg | ORAL_TABLET | Freq: Every day | ORAL | Status: DC

## 2015-04-24 NOTE — Discharge Summary (Signed)
Physician Discharge Summary  [name] [id number] DOB: [birth year] DOA: 04/21/2015  PCP: Maggie Font, MD  Admit date: 04/21/2015 Discharge date: 04/24/2015  Time spent: 45 minutes  Recommendations for Outpatient Follow-up:  1. PCP Dr.Hill on 12/6    Discharge Diagnoses:  Principal Problem:   Diverticulitis of intestine without perforation or abscess without bleeding Active Problems:   Systolic CHF, chronic (HCC)   Hypertension, benign   DM II (diabetes mellitus, type II), controlled ([place])   Gout, chronic   End-stage renal disease on hemodialysis ([place])   Acute diverticulitis   Discharge Condition:stable  Diet recommendation: renal bland diet for 2-3days   [facility] Weights   04/23/15 0845 04/23/15 1241 04/23/15 2015  Weight: 113.3 kg (249 lb 12.5 oz) 112.2 kg (247 lb 5.7 oz) 112.4 kg (247 lb 12.8 oz)    History of present illness:  Chief complaint: N/V/Abd pain Mr [name] is a pleasant 54 year old gentleman with a past medical history of end-stage renal disease currently in hemodialysis Mondays, Wednesdays, Fridays, history of diverticulosis, admitted to the medicine service on 04/21/2015 when he presented with complaints of nausea vomiting associated with abdominal pain. CT scan of abdomen and pelvis revealed the presence of diverticulitis involving the mid to distal sigmoid colon   Hospital Course:  1. Sigmoid diverticulitis. -Treated with empiric IV antibiotic therapy with ciprofloxacin and Flagyl -CT scan did not reveal evidence of perforation or abscess. -improved, was on bowel rest, diet gradually advanced, changed to Oral CIpro Flagyl and discharged home in stable condition, and advised to FU with PCP in 1 week  2. End-stage renal disease. -Continued on hemodialysis on Mondays, Wednesdays, Fridays. -followed by Renal  3. Diabetes mellitus. -not on meds, CBGs stable, last hbaic was 5.4 in 8/16  4. History of hypertension. -stable, continue carvedilol and  isosorbide dinitrate  Consultations:  Renal  Discharge Exam: Filed Vitals:   04/24/15 0443 04/24/15 1019  BP: 123/74 119/78  Pulse: 71 74  Temp: 98.5 F (36.9 C) 98 F (36.7 C)  Resp: 17 18    General: AAOx3 Cardiovascular: S1S2/RRR Respiratory: CTAB  Discharge Instructions   Discharge Instructions    Discharge instructions    Complete by:  As directed   Renal bland diet for few days then advance to Regular Renal diet     Increase activity slowly    Complete by:  As directed           Discharge Medication List as of 04/24/2015 11:43 AM    START taking these medications   Details  ciprofloxacin (CIPRO) 500 MG tablet Take 1 tablet (500 mg total) by mouth daily with breakfast. For 5 days, Starting 04/24/2015, Until Discontinued, Print    metroNIDAZOLE (FLAGYL) 500 MG tablet Take 1 tablet (500 mg total) by mouth 3 (three) times daily. For 6 days, Starting 04/24/2015, Until Discontinued, Print      CONTINUE these medications which have NOT CHANGED   Details  amLODipine (NORVASC) 10 MG tablet Take 10 mg by mouth daily after lunch. , Until Discontinued, Historical Med    aspirin 325 MG tablet Take 325 mg by mouth daily. , Until Discontinued, Historical Med    b complex-vitamin c-folic acid (NEPHRO-VITE) 0.8 MG TABS tablet Take 1 tablet by mouth at bedtime., Until Discontinued, Historical Med    calcium acetate (PHOSLO) 667 MG capsule Take 667-2,001 mg by mouth 5 (five) times daily. Takes 2 capsules three times a day with meals.  Also takes 1-3 capsule with snacks, dose depends on  what the snack is, Until Discontinued, Historical Med    carvedilol (COREG) 25 MG tablet Take 0.5 tablets (12.5 mg total) by mouth 2 (two) times daily with a meal., Starting 01/16/2015, Until Discontinued, Print    Homeopathic Products (ARTHRITIS RELIEF PO) Take 2 tablets by mouth daily as needed (for pain)., Until Discontinued, Historical Med    isosorbide dinitrate (ISORDIL) 20 MG tablet Take  20 mg by mouth daily. , Until Discontinued, Historical Med    lidocaine-prilocaine (EMLA) cream Apply 1 application topically every Monday, Wednesday, and Friday., Starting 04/08/2015, Until Discontinued, Historical Med    Nutritional Supplements (FEEDING SUPPLEMENT, NEPRO CARB STEADY,) LIQD Take 237 mLs by mouth 2 (two) times daily between meals., Starting 01/16/2015, Until Discontinued, OTC    pantoprazole (PROTONIX) 40 MG tablet Take 1 tablet (40 mg total) by mouth daily at 12 noon., Starting 01/16/2015, Until Discontinued, Print    oxyCODONE-acetaminophen (PERCOCET/ROXICET) 5-325 MG per tablet Take 1 tablet by mouth at bedtime as needed for moderate pain., Starting 12/20/2014, Until Discontinued, Print       Allergies  Allergen Reactions  . Ace Inhibitors Cough   Follow-up Information    Follow up with HILL,GERALD K, MD. Schedule an appointment as soon as possible for a visit in 1 week.   Specialty:  Family Medicine   Why:  APPOINTMENT:  Tuesday, 04-29-15 @ 11 am   Contact information:   [address] [place] [address] [phone]        The results of significant diagnostics from this hospitalization (including imaging, microbiology, ancillary and laboratory) are listed below for reference.    Significant Diagnostic Studies: Ct Abdomen Pelvis W Contrast  04/21/2015  ADDENDUM REPORT: 04/21/2015 18:58 ADD TO IMPRESSION: Patient is status post right nephrectomy. Cysts in left kidney are consistent with chronic renal failure. Electronically Signed   By: Lowella Grip III M.D.   On: 04/21/2015 18:58  04/21/2015  CLINICAL DATA:  Five day history of left lower quadrant abdominal pain. Chronic renal failure. EXAM: CT ABDOMEN AND PELVIS WITH CONTRAST TECHNIQUE: Multidetector CT imaging of the abdomen and pelvis was performed using the standard protocol following bolus administration of intravenous contrast. Oral contrast was also administered. CONTRAST:  36mL OMNIPAQUE  IOHEXOL 300 MG/ML  SOLN COMPARISON:  December 18, 2014 FINDINGS: Lower chest: Visualized lung bases are clear. Heart is mildly enlarged with small pericardial effusion. Hepatobiliary: A previously noted focus of decreased attenuation with peripheral enhancement in the left lobe of the liver is not appreciably changed compared to prior studies. The decreased attenuation central region measures 1 cm with approximately 2.4 x 2.0 cm of peripheral enhancement. No new liver lesions are identified. Gallbladder is borderline distended without gallbladder wall thickening. There is no biliary duct dilatation. Pancreas: No mass or inflammatory focus. Spleen: No splenic lesions are identified. Adrenals/Urinary Tract: Right adrenal appears normal. Mild left adrenal hypertrophy is stable. Patient is status post right nephrectomy. There are multiple subcentimeter cysts throughout the left kidney. There is prominence of the renal sinus fat in the left kidney. There is no demonstrable hydronephrosis on the left. No renal or ureteral calculus is appreciable. Urinary bladder is completely collapsed. Stomach/Bowel: There is wall thickening in the mid to distal descending colon with surrounding mesenteric inflammation and several irregular diverticula consistent with diverticulitis. There is no frank abscess or perforation in this area of diverticulitis. There is no bowel obstruction. No free air or portal venous air. Vascular/Lymphatic: There is no abdominal aortic aneurysm.  There are scattered foci of atherosclerotic calcification. The major mesenteric vessels appear patent. There is no adenopathy in the abdomen or pelvis. Reproductive: Prostate and seminal vesicles are normal in size and contour. No pelvic mass or pelvic fluid collection. Other: The appendix appears normal. There is no abscess or ascites in the abdomen or pelvis. There is a moderate ventral hernia containing fat but no bowel. There is fat in the left inguinal ring.  Musculoskeletal: There is degenerative change in the lumbar spine, most marked at L4-5. There are scattered probable bone islands. There are no lytic or destructive bone lesions. No intramuscular lesions are identified. IMPRESSION: Diverticulitis involving the mid to distal sigmoid colon without frank abscess or perforation. There is extensive mesenteric thickening surrounding this area of diverticulitis. The wall of the mid to distal sigmoid colon is thickened, and there are several irregular diverticula in this area. Small pericardial effusion.  Mild cardiomegaly. Lesion left lobe of liver, noted previously which has a central area of decreased attenuation and peripheral enhancement. Suspect atypical hemangioma. Particular attention to this area on subsequent evaluations advised. Stable left adrenal hypertrophy. Moderate ventral hernia containing fat but no bowel. Electronically Signed: By: Lowella Grip III M.D. On: 04/21/2015 18:40    Microbiology: Recent Results (from the past 240 hour(s))  MRSA PCR Screening     Status: None   Collection Time: 04/21/15 10:05 PM  Result Value Ref Range Status   MRSA by PCR NEGATIVE NEGATIVE Final    Comment:        The GeneXpert MRSA Assay (FDA approved for NASAL specimens only), is one component of a comprehensive MRSA colonization surveillance program. It is not intended to diagnose MRSA infection nor to guide or monitor treatment for MRSA infections.      Labs: Basic Metabolic Panel:  Recent Labs Lab 04/21/15 1150 04/22/15 0537 04/23/15 0634 04/23/15 1526 04/24/15 0630  NA 137 133* 127*  --  131*  K 4.7 6.4* 6.1* 4.1 4.7  CL 93* 90* 86*  --  92*  CO2 31 32 30  --  26  GLUCOSE 92 81 72  --  79  BUN 20 33* 47*  --  24*  CREATININE 8.72* 11.65* 13.78*  --  9.63*  CALCIUM 9.8 10.0 9.5  --  9.5   Liver Function Tests:  Recent Labs Lab 04/21/15 1150 04/22/15 0537  AST 9* 9*  ALT 9* 9*  ALKPHOS 88 85  BILITOT 1.3* 1.3*  PROT  8.7* 7.9  ALBUMIN 3.6 3.2*    Recent Labs Lab 04/21/15 1150  LIPASE 24   No results for input(s): AMMONIA in the last 168 hours. CBC:  Recent Labs Lab 04/21/15 1150 04/22/15 0537 04/23/15 0634 04/24/15 0630  WBC 9.9 9.3 5.8 3.2*  NEUTROABS  --  7.4  --   --   HGB 14.8 13.0 12.2* 11.2*  HCT 43.9 40.7 37.2* 34.8*  MCV 94.2 96.0 94.9 94.6  PLT 124* 140* 132* 133*   Cardiac Enzymes: No results for input(s): CKTOTAL, CKMB, CKMBINDEX, TROPONINI in the last 168 hours. BNP: BNP (last 3 results)  Recent Labs  01/05/15 0738 01/13/15 1431  BNP 1205.8* 1555.2*    ProBNP (last 3 results) No results for input(s): PROBNP in the last 8760 hours.  CBG:  Recent Labs Lab 04/23/15 2013 04/24/15 0018 04/24/15 0440 04/24/15 0820 04/24/15 1159  GLUCAP 114* 88 95 70 105*       Signed:  Shayra Anton  [facility] 04/24/2015, 6:13 PM

## 2015-04-24 NOTE — Progress Notes (Signed)
Pt provided with discharge instruction including information on follow up appointments and new medications. Pt verbalized understanding of all information. IV was dc'd with no complication. VSS. Pt escorted out via wheelchair by volunteer.

## 2015-04-24 NOTE — Progress Notes (Signed)
Subjective:  No cos / tolerated HD yest/ noted possible dc home  Objective Vital signs in last 24 hours: Filed Vitals:   04/23/15 1241 04/23/15 1601 04/23/15 2015 04/24/15 0443  BP: 123/83 105/64 103/67 123/74  Pulse: 72 76 69 71  Temp: 98.2 F (36.8 C) 98 F (36.7 C) 98.6 F (37 C) 98.5 F (36.9 C)  TempSrc: Oral Oral Oral Oral  Resp: 23 17 18 17   Height:      Weight: 112.2 kg (247 lb 5.7 oz)  112.4 kg (247 lb 12.8 oz)   SpO2: 100% 98% 100% 100%   Weight change: -0.099 kg (-3.5 oz)  Physical Exam: General: alert NAD.appropriate Heart: RRR soft 1/6 sem lsb no R /G Lungs: CTA Abdomen: BS pos, , soft LL non tend. / ND Extremities: no pedal edema Dialysis Access: pos bruit L FA AVF   [place] MWF 4h 111.5kg 2/2.25 bath LFA AVF Heparin 12,000 then 4000 midRx Hect 4 ug / hd Lab - hb 12.8, Ca 9.6, P 6.9, pth 1212  Problem/Plan: 1 Abd pain / diverticulitis= IV antib. 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  Kelly Splinter MD [facility] pager [phone]    cell [phone] 04/24/2015, 2:45 PM    Labs: Basic Metabolic Panel:  Recent Labs Lab 04/22/15 0537 04/23/15 0634 04/23/15 1526 04/24/15 0630  NA 133* 127*  --  131*  K 6.4* 6.1* 4.1 4.7  CL 90* 86*  --  92*  CO2 32 30  --  26  GLUCOSE 81 72  --  79  BUN 33* 47*  --  24*  CREATININE 11.65* 13.78*  --  9.63*  CALCIUM 10.0 9.5  --  9.5   Liver Function Tests:  Recent Labs Lab 04/21/15 1150 04/22/15 0537  AST 9* 9*  ALT 9* 9*  ALKPHOS 88 85  BILITOT 1.3* 1.3*  PROT 8.7* 7.9   ALBUMIN 3.6 3.2*    Recent Labs Lab 04/21/15 1150  LIPASE 24   No results for input(s): AMMONIA in the last 168 hours. CBC:  Recent Labs Lab 04/21/15 1150 04/22/15 0537 04/23/15 0634 04/24/15 0630  WBC 9.9 9.3 5.8 3.2*  NEUTROABS  --  7.4  --   --   HGB 14.8 13.0 12.2* 11.2*  HCT 43.9 40.7 37.2* 34.8*  MCV 94.2 96.0 94.9 94.6  PLT 124* 140* 132* 133*   Cardiac Enzymes: No results for input(s): CKTOTAL, CKMB, CKMBINDEX, TROPONINI in the last 168 hours. CBG:  Recent Labs Lab 04/23/15 1308 04/23/15 1631 04/23/15 2013 04/24/15 0018 04/24/15 0440  GLUCAP 82 121* 114* 88 95    Studies/Results: No results found. Medications:   . aspirin  325 mg Oral Daily  . calcium acetate  1,334 mg Oral TID WC  . carvedilol  12.5 mg Oral BID WC  . ciprofloxacin  400 mg Intravenous Q24H  . doxercalciferol  4 mcg Intravenous Q M,W,F-HD  . feeding supplement  1 Container Oral  BID BM  . heparin subcutaneous  5,000 Units Subcutaneous 3 times per day  . insulin aspart  0-9 Units Subcutaneous Q4H  . isosorbide dinitrate  20 mg Oral Daily  . metronidazole  500 mg Intravenous Q8H  . pantoprazole  40 mg Oral Q1200  . sodium chloride  3 mL Intravenous Q12H

## 2015-04-25 DIAGNOSIS — D509 Iron deficiency anemia, unspecified: Secondary | ICD-10-CM | POA: Diagnosis not present

## 2015-04-25 DIAGNOSIS — N186 End stage renal disease: Secondary | ICD-10-CM | POA: Diagnosis not present

## 2015-04-25 DIAGNOSIS — D631 Anemia in chronic kidney disease: Secondary | ICD-10-CM | POA: Diagnosis not present

## 2015-04-25 DIAGNOSIS — E1129 Type 2 diabetes mellitus with other diabetic kidney complication: Secondary | ICD-10-CM | POA: Diagnosis not present

## 2015-04-25 DIAGNOSIS — N2581 Secondary hyperparathyroidism of renal origin: Secondary | ICD-10-CM | POA: Diagnosis not present

## 2015-04-28 DIAGNOSIS — N2581 Secondary hyperparathyroidism of renal origin: Secondary | ICD-10-CM | POA: Diagnosis not present

## 2015-04-28 DIAGNOSIS — D631 Anemia in chronic kidney disease: Secondary | ICD-10-CM | POA: Diagnosis not present

## 2015-04-28 DIAGNOSIS — D509 Iron deficiency anemia, unspecified: Secondary | ICD-10-CM | POA: Diagnosis not present

## 2015-04-28 DIAGNOSIS — N186 End stage renal disease: Secondary | ICD-10-CM | POA: Diagnosis not present

## 2015-04-28 DIAGNOSIS — E1129 Type 2 diabetes mellitus with other diabetic kidney complication: Secondary | ICD-10-CM | POA: Diagnosis not present

## 2015-04-29 DIAGNOSIS — N186 End stage renal disease: Secondary | ICD-10-CM | POA: Diagnosis not present

## 2015-04-29 DIAGNOSIS — K5780 Diverticulitis of intestine, part unspecified, with perforation and abscess without bleeding: Secondary | ICD-10-CM | POA: Diagnosis not present

## 2015-04-29 DIAGNOSIS — E1122 Type 2 diabetes mellitus with diabetic chronic kidney disease: Secondary | ICD-10-CM | POA: Diagnosis not present

## 2015-04-30 DIAGNOSIS — D509 Iron deficiency anemia, unspecified: Secondary | ICD-10-CM | POA: Diagnosis not present

## 2015-04-30 DIAGNOSIS — E1129 Type 2 diabetes mellitus with other diabetic kidney complication: Secondary | ICD-10-CM | POA: Diagnosis not present

## 2015-04-30 DIAGNOSIS — N2581 Secondary hyperparathyroidism of renal origin: Secondary | ICD-10-CM | POA: Diagnosis not present

## 2015-04-30 DIAGNOSIS — N186 End stage renal disease: Secondary | ICD-10-CM | POA: Diagnosis not present

## 2015-04-30 DIAGNOSIS — D631 Anemia in chronic kidney disease: Secondary | ICD-10-CM | POA: Diagnosis not present

## 2015-05-02 DIAGNOSIS — N186 End stage renal disease: Secondary | ICD-10-CM | POA: Diagnosis not present

## 2015-05-02 DIAGNOSIS — D509 Iron deficiency anemia, unspecified: Secondary | ICD-10-CM | POA: Diagnosis not present

## 2015-05-02 DIAGNOSIS — N2581 Secondary hyperparathyroidism of renal origin: Secondary | ICD-10-CM | POA: Diagnosis not present

## 2015-05-02 DIAGNOSIS — E1129 Type 2 diabetes mellitus with other diabetic kidney complication: Secondary | ICD-10-CM | POA: Diagnosis not present

## 2015-05-02 DIAGNOSIS — D631 Anemia in chronic kidney disease: Secondary | ICD-10-CM | POA: Diagnosis not present

## 2015-05-05 DIAGNOSIS — E1129 Type 2 diabetes mellitus with other diabetic kidney complication: Secondary | ICD-10-CM | POA: Diagnosis not present

## 2015-05-05 DIAGNOSIS — D509 Iron deficiency anemia, unspecified: Secondary | ICD-10-CM | POA: Diagnosis not present

## 2015-05-05 DIAGNOSIS — D631 Anemia in chronic kidney disease: Secondary | ICD-10-CM | POA: Diagnosis not present

## 2015-05-05 DIAGNOSIS — N186 End stage renal disease: Secondary | ICD-10-CM | POA: Diagnosis not present

## 2015-05-05 DIAGNOSIS — N2581 Secondary hyperparathyroidism of renal origin: Secondary | ICD-10-CM | POA: Diagnosis not present

## 2015-05-07 DIAGNOSIS — N2581 Secondary hyperparathyroidism of renal origin: Secondary | ICD-10-CM | POA: Diagnosis not present

## 2015-05-07 DIAGNOSIS — E1129 Type 2 diabetes mellitus with other diabetic kidney complication: Secondary | ICD-10-CM | POA: Diagnosis not present

## 2015-05-07 DIAGNOSIS — D509 Iron deficiency anemia, unspecified: Secondary | ICD-10-CM | POA: Diagnosis not present

## 2015-05-07 DIAGNOSIS — D631 Anemia in chronic kidney disease: Secondary | ICD-10-CM | POA: Diagnosis not present

## 2015-05-07 DIAGNOSIS — N186 End stage renal disease: Secondary | ICD-10-CM | POA: Diagnosis not present

## 2015-05-08 DIAGNOSIS — I871 Compression of vein: Secondary | ICD-10-CM | POA: Diagnosis not present

## 2015-05-08 DIAGNOSIS — Z992 Dependence on renal dialysis: Secondary | ICD-10-CM | POA: Diagnosis not present

## 2015-05-08 DIAGNOSIS — N186 End stage renal disease: Secondary | ICD-10-CM | POA: Diagnosis not present

## 2015-05-08 DIAGNOSIS — T82858D Stenosis of vascular prosthetic devices, implants and grafts, subsequent encounter: Secondary | ICD-10-CM | POA: Diagnosis not present

## 2015-05-09 DIAGNOSIS — D631 Anemia in chronic kidney disease: Secondary | ICD-10-CM | POA: Diagnosis not present

## 2015-05-09 DIAGNOSIS — N186 End stage renal disease: Secondary | ICD-10-CM | POA: Diagnosis not present

## 2015-05-09 DIAGNOSIS — D509 Iron deficiency anemia, unspecified: Secondary | ICD-10-CM | POA: Diagnosis not present

## 2015-05-09 DIAGNOSIS — E1129 Type 2 diabetes mellitus with other diabetic kidney complication: Secondary | ICD-10-CM | POA: Diagnosis not present

## 2015-05-09 DIAGNOSIS — N2581 Secondary hyperparathyroidism of renal origin: Secondary | ICD-10-CM | POA: Diagnosis not present

## 2015-05-12 DIAGNOSIS — D509 Iron deficiency anemia, unspecified: Secondary | ICD-10-CM | POA: Diagnosis not present

## 2015-05-12 DIAGNOSIS — N2581 Secondary hyperparathyroidism of renal origin: Secondary | ICD-10-CM | POA: Diagnosis not present

## 2015-05-12 DIAGNOSIS — E1129 Type 2 diabetes mellitus with other diabetic kidney complication: Secondary | ICD-10-CM | POA: Diagnosis not present

## 2015-05-12 DIAGNOSIS — N186 End stage renal disease: Secondary | ICD-10-CM | POA: Diagnosis not present

## 2015-05-12 DIAGNOSIS — D631 Anemia in chronic kidney disease: Secondary | ICD-10-CM | POA: Diagnosis not present

## 2015-05-14 DIAGNOSIS — N186 End stage renal disease: Secondary | ICD-10-CM | POA: Diagnosis not present

## 2015-05-14 DIAGNOSIS — D509 Iron deficiency anemia, unspecified: Secondary | ICD-10-CM | POA: Diagnosis not present

## 2015-05-14 DIAGNOSIS — E1129 Type 2 diabetes mellitus with other diabetic kidney complication: Secondary | ICD-10-CM | POA: Diagnosis not present

## 2015-05-14 DIAGNOSIS — D631 Anemia in chronic kidney disease: Secondary | ICD-10-CM | POA: Diagnosis not present

## 2015-05-14 DIAGNOSIS — N2581 Secondary hyperparathyroidism of renal origin: Secondary | ICD-10-CM | POA: Diagnosis not present

## 2015-05-16 DIAGNOSIS — N2581 Secondary hyperparathyroidism of renal origin: Secondary | ICD-10-CM | POA: Diagnosis not present

## 2015-05-16 DIAGNOSIS — D509 Iron deficiency anemia, unspecified: Secondary | ICD-10-CM | POA: Diagnosis not present

## 2015-05-16 DIAGNOSIS — N186 End stage renal disease: Secondary | ICD-10-CM | POA: Diagnosis not present

## 2015-05-16 DIAGNOSIS — E1129 Type 2 diabetes mellitus with other diabetic kidney complication: Secondary | ICD-10-CM | POA: Diagnosis not present

## 2015-05-16 DIAGNOSIS — D631 Anemia in chronic kidney disease: Secondary | ICD-10-CM | POA: Diagnosis not present

## 2015-05-19 DIAGNOSIS — D631 Anemia in chronic kidney disease: Secondary | ICD-10-CM | POA: Diagnosis not present

## 2015-05-19 DIAGNOSIS — N2581 Secondary hyperparathyroidism of renal origin: Secondary | ICD-10-CM | POA: Diagnosis not present

## 2015-05-19 DIAGNOSIS — E1129 Type 2 diabetes mellitus with other diabetic kidney complication: Secondary | ICD-10-CM | POA: Diagnosis not present

## 2015-05-19 DIAGNOSIS — N186 End stage renal disease: Secondary | ICD-10-CM | POA: Diagnosis not present

## 2015-05-19 DIAGNOSIS — D509 Iron deficiency anemia, unspecified: Secondary | ICD-10-CM | POA: Diagnosis not present

## 2015-05-20 DIAGNOSIS — N186 End stage renal disease: Secondary | ICD-10-CM | POA: Diagnosis not present

## 2015-05-20 DIAGNOSIS — R1032 Left lower quadrant pain: Secondary | ICD-10-CM | POA: Diagnosis not present

## 2015-05-20 DIAGNOSIS — I5022 Chronic systolic (congestive) heart failure: Secondary | ICD-10-CM | POA: Diagnosis not present

## 2015-05-20 DIAGNOSIS — I2510 Atherosclerotic heart disease of native coronary artery without angina pectoris: Secondary | ICD-10-CM | POA: Diagnosis not present

## 2015-05-20 DIAGNOSIS — I120 Hypertensive chronic kidney disease with stage 5 chronic kidney disease or end stage renal disease: Secondary | ICD-10-CM | POA: Diagnosis not present

## 2015-05-21 DIAGNOSIS — N2581 Secondary hyperparathyroidism of renal origin: Secondary | ICD-10-CM | POA: Diagnosis not present

## 2015-05-21 DIAGNOSIS — D631 Anemia in chronic kidney disease: Secondary | ICD-10-CM | POA: Diagnosis not present

## 2015-05-21 DIAGNOSIS — D509 Iron deficiency anemia, unspecified: Secondary | ICD-10-CM | POA: Diagnosis not present

## 2015-05-21 DIAGNOSIS — E1129 Type 2 diabetes mellitus with other diabetic kidney complication: Secondary | ICD-10-CM | POA: Diagnosis not present

## 2015-05-21 DIAGNOSIS — N186 End stage renal disease: Secondary | ICD-10-CM | POA: Diagnosis not present

## 2015-05-23 DIAGNOSIS — N2581 Secondary hyperparathyroidism of renal origin: Secondary | ICD-10-CM | POA: Diagnosis not present

## 2015-05-23 DIAGNOSIS — E1129 Type 2 diabetes mellitus with other diabetic kidney complication: Secondary | ICD-10-CM | POA: Diagnosis not present

## 2015-05-23 DIAGNOSIS — N186 End stage renal disease: Secondary | ICD-10-CM | POA: Diagnosis not present

## 2015-05-23 DIAGNOSIS — D631 Anemia in chronic kidney disease: Secondary | ICD-10-CM | POA: Diagnosis not present

## 2015-05-23 DIAGNOSIS — D509 Iron deficiency anemia, unspecified: Secondary | ICD-10-CM | POA: Diagnosis not present

## 2015-05-24 DIAGNOSIS — N186 End stage renal disease: Secondary | ICD-10-CM | POA: Diagnosis not present

## 2015-05-24 DIAGNOSIS — Z992 Dependence on renal dialysis: Secondary | ICD-10-CM | POA: Diagnosis not present

## 2015-05-24 DIAGNOSIS — I120 Hypertensive chronic kidney disease with stage 5 chronic kidney disease or end stage renal disease: Secondary | ICD-10-CM | POA: Diagnosis not present

## 2015-05-26 DIAGNOSIS — D631 Anemia in chronic kidney disease: Secondary | ICD-10-CM | POA: Diagnosis not present

## 2015-05-26 DIAGNOSIS — N2581 Secondary hyperparathyroidism of renal origin: Secondary | ICD-10-CM | POA: Diagnosis not present

## 2015-05-26 DIAGNOSIS — D509 Iron deficiency anemia, unspecified: Secondary | ICD-10-CM | POA: Diagnosis not present

## 2015-05-26 DIAGNOSIS — E1129 Type 2 diabetes mellitus with other diabetic kidney complication: Secondary | ICD-10-CM | POA: Diagnosis not present

## 2015-05-26 DIAGNOSIS — N186 End stage renal disease: Secondary | ICD-10-CM | POA: Diagnosis not present

## 2015-05-29 DIAGNOSIS — R1032 Left lower quadrant pain: Secondary | ICD-10-CM | POA: Diagnosis not present

## 2015-05-29 DIAGNOSIS — I120 Hypertensive chronic kidney disease with stage 5 chronic kidney disease or end stage renal disease: Secondary | ICD-10-CM | POA: Diagnosis not present

## 2015-05-29 DIAGNOSIS — I5022 Chronic systolic (congestive) heart failure: Secondary | ICD-10-CM | POA: Diagnosis not present

## 2015-05-29 DIAGNOSIS — I2510 Atherosclerotic heart disease of native coronary artery without angina pectoris: Secondary | ICD-10-CM | POA: Diagnosis not present

## 2015-05-29 DIAGNOSIS — N186 End stage renal disease: Secondary | ICD-10-CM | POA: Diagnosis not present

## 2015-06-18 DIAGNOSIS — E1129 Type 2 diabetes mellitus with other diabetic kidney complication: Secondary | ICD-10-CM | POA: Diagnosis not present

## 2015-06-24 DIAGNOSIS — N186 End stage renal disease: Secondary | ICD-10-CM | POA: Diagnosis not present

## 2015-06-24 DIAGNOSIS — Z992 Dependence on renal dialysis: Secondary | ICD-10-CM | POA: Diagnosis not present

## 2015-06-24 DIAGNOSIS — I120 Hypertensive chronic kidney disease with stage 5 chronic kidney disease or end stage renal disease: Secondary | ICD-10-CM | POA: Diagnosis not present

## 2015-06-25 DIAGNOSIS — D631 Anemia in chronic kidney disease: Secondary | ICD-10-CM | POA: Diagnosis not present

## 2015-06-25 DIAGNOSIS — N2581 Secondary hyperparathyroidism of renal origin: Secondary | ICD-10-CM | POA: Diagnosis not present

## 2015-06-25 DIAGNOSIS — E1129 Type 2 diabetes mellitus with other diabetic kidney complication: Secondary | ICD-10-CM | POA: Diagnosis not present

## 2015-06-25 DIAGNOSIS — N186 End stage renal disease: Secondary | ICD-10-CM | POA: Diagnosis not present

## 2015-06-27 ENCOUNTER — Inpatient Hospital Stay (HOSPITAL_COMMUNITY)
Admission: AD | Admit: 2015-06-27 | Discharge: 2015-07-05 | DRG: 308 | Disposition: A | Discharge status: Home / Self Care | Payer: Medicare Other | Source: Ambulatory Visit | Attending: Cardiovascular Disease | Admitting: Cardiovascular Disease

## 2015-06-27 ENCOUNTER — Inpatient Hospital Stay (HOSPITAL_COMMUNITY): Payer: Medicare Other

## 2015-06-27 DIAGNOSIS — I420 Dilated cardiomyopathy: Secondary | ICD-10-CM | POA: Diagnosis not present

## 2015-06-27 DIAGNOSIS — Z85528 Personal history of other malignant neoplasm of kidney: Secondary | ICD-10-CM

## 2015-06-27 DIAGNOSIS — E1122 Type 2 diabetes mellitus with diabetic chronic kidney disease: Secondary | ICD-10-CM | POA: Diagnosis present

## 2015-06-27 DIAGNOSIS — H5441 Blindness, right eye, normal vision left eye: Secondary | ICD-10-CM | POA: Diagnosis present

## 2015-06-27 DIAGNOSIS — Z6832 Body mass index (BMI) 32.0-32.9, adult: Secondary | ICD-10-CM

## 2015-06-27 DIAGNOSIS — I4581 Long QT syndrome: Secondary | ICD-10-CM | POA: Diagnosis present

## 2015-06-27 DIAGNOSIS — I481 Persistent atrial fibrillation: Secondary | ICD-10-CM | POA: Diagnosis not present

## 2015-06-27 DIAGNOSIS — I252 Old myocardial infarction: Secondary | ICD-10-CM | POA: Diagnosis not present

## 2015-06-27 DIAGNOSIS — D649 Anemia, unspecified: Secondary | ICD-10-CM | POA: Diagnosis present

## 2015-06-27 DIAGNOSIS — R531 Weakness: Secondary | ICD-10-CM | POA: Diagnosis not present

## 2015-06-27 DIAGNOSIS — R0600 Dyspnea, unspecified: Secondary | ICD-10-CM | POA: Diagnosis not present

## 2015-06-27 DIAGNOSIS — Z992 Dependence on renal dialysis: Secondary | ICD-10-CM

## 2015-06-27 DIAGNOSIS — Z8673 Personal history of transient ischemic attack (TIA), and cerebral infarction without residual deficits: Secondary | ICD-10-CM | POA: Diagnosis not present

## 2015-06-27 DIAGNOSIS — I2510 Atherosclerotic heart disease of native coronary artery without angina pectoris: Secondary | ICD-10-CM | POA: Diagnosis not present

## 2015-06-27 DIAGNOSIS — I5022 Chronic systolic (congestive) heart failure: Secondary | ICD-10-CM | POA: Diagnosis not present

## 2015-06-27 DIAGNOSIS — I4892 Unspecified atrial flutter: Secondary | ICD-10-CM | POA: Diagnosis not present

## 2015-06-27 DIAGNOSIS — K5732 Diverticulitis of large intestine without perforation or abscess without bleeding: Secondary | ICD-10-CM | POA: Diagnosis not present

## 2015-06-27 DIAGNOSIS — I4891 Unspecified atrial fibrillation: Secondary | ICD-10-CM | POA: Diagnosis present

## 2015-06-27 DIAGNOSIS — K5792 Diverticulitis of intestine, part unspecified, without perforation or abscess without bleeding: Secondary | ICD-10-CM

## 2015-06-27 DIAGNOSIS — I132 Hypertensive heart and chronic kidney disease with heart failure and with stage 5 chronic kidney disease, or end stage renal disease: Secondary | ICD-10-CM | POA: Diagnosis not present

## 2015-06-27 DIAGNOSIS — N2581 Secondary hyperparathyroidism of renal origin: Secondary | ICD-10-CM | POA: Diagnosis present

## 2015-06-27 DIAGNOSIS — Z905 Acquired absence of kidney: Secondary | ICD-10-CM | POA: Diagnosis not present

## 2015-06-27 DIAGNOSIS — N186 End stage renal disease: Secondary | ICD-10-CM | POA: Diagnosis present

## 2015-06-27 DIAGNOSIS — I498 Other specified cardiac arrhythmias: Secondary | ICD-10-CM

## 2015-06-27 DIAGNOSIS — E119 Type 2 diabetes mellitus without complications: Secondary | ICD-10-CM | POA: Diagnosis not present

## 2015-06-27 DIAGNOSIS — I120 Hypertensive chronic kidney disease with stage 5 chronic kidney disease or end stage renal disease: Secondary | ICD-10-CM | POA: Diagnosis not present

## 2015-06-27 DIAGNOSIS — R002 Palpitations: Secondary | ICD-10-CM | POA: Diagnosis not present

## 2015-06-27 DIAGNOSIS — D631 Anemia in chronic kidney disease: Secondary | ICD-10-CM | POA: Diagnosis not present

## 2015-06-27 DIAGNOSIS — Z7901 Long term (current) use of anticoagulants: Secondary | ICD-10-CM | POA: Diagnosis not present

## 2015-06-27 HISTORY — DX: Diverticulitis of intestine, part unspecified, without perforation or abscess without bleeding: K57.92

## 2015-06-27 LAB — CBC WITH DIFFERENTIAL/PLATELET
Basophils Absolute: 0 10*3/uL (ref 0.0–0.1)
Basophils Relative: 0 %
Eosinophils Absolute: 0.2 10*3/uL (ref 0.0–0.7)
Eosinophils Relative: 5 %
HCT: 35.4 % — ABNORMAL LOW (ref 39.0–52.0)
Hemoglobin: 11.8 g/dL — ABNORMAL LOW (ref 13.0–17.0)
Lymphocytes Relative: 29 %
Lymphs Abs: 1.3 10*3/uL (ref 0.7–4.0)
MCH: 31.3 pg (ref 26.0–34.0)
MCHC: 33.3 g/dL (ref 30.0–36.0)
MCV: 93.9 fL (ref 78.0–100.0)
Monocytes Absolute: 0.4 10*3/uL (ref 0.1–1.0)
Monocytes Relative: 10 %
Neutro Abs: 2.5 10*3/uL (ref 1.7–7.7)
Neutrophils Relative %: 56 %
Platelets: 175 10*3/uL (ref 150–400)
RBC: 3.77 MIL/uL — ABNORMAL LOW (ref 4.22–5.81)
RDW: 16 % — ABNORMAL HIGH (ref 11.5–15.5)
WBC: 4.4 10*3/uL (ref 4.0–10.5)

## 2015-06-27 LAB — COMPREHENSIVE METABOLIC PANEL
ALT: 12 U/L — ABNORMAL LOW (ref 17–63)
AST: 13 U/L — ABNORMAL LOW (ref 15–41)
Albumin: 3.4 g/dL — ABNORMAL LOW (ref 3.5–5.0)
Alkaline Phosphatase: 71 U/L (ref 38–126)
Anion gap: 13 (ref 5–15)
BUN: 21 mg/dL — ABNORMAL HIGH (ref 6–20)
CO2: 30 mmol/L (ref 22–32)
Calcium: 9.8 mg/dL (ref 8.9–10.3)
Chloride: 98 mmol/L — ABNORMAL LOW (ref 101–111)
Creatinine, Ser: 7.63 mg/dL — ABNORMAL HIGH (ref 0.61–1.24)
GFR calc Af Amer: 8 mL/min — ABNORMAL LOW (ref >60)
GFR calc non Af Amer: 7 mL/min — ABNORMAL LOW (ref >60)
Glucose, Bld: 113 mg/dL — ABNORMAL HIGH (ref 65–99)
Potassium: 3.9 mmol/L (ref 3.5–5.1)
Sodium: 141 mmol/L (ref 135–145)
Total Bilirubin: 0.3 mg/dL (ref 0.3–1.2)
Total Protein: 7.4 g/dL (ref 6.5–8.1)

## 2015-06-27 LAB — TSH: TSH: 0.768 u[IU]/mL (ref 0.350–4.500)

## 2015-06-27 MED ORDER — AMIODARONE LOAD VIA INFUSION
150.0000 mg | Freq: Once | INTRAVENOUS | Status: AC
  Administered 2015-06-27: 150 mg via INTRAVENOUS
  Filled 2015-06-27: qty 83.34

## 2015-06-27 MED ORDER — CARVEDILOL 12.5 MG PO TABS
12.5000 mg | ORAL_TABLET | ORAL | Status: DC
  Administered 2015-06-28 – 2015-07-03 (×4): 12.5 mg via ORAL
  Filled 2015-06-27 (×4): qty 1

## 2015-06-27 MED ORDER — CALCIUM ACETATE (PHOS BINDER) 667 MG PO CAPS
1334.0000 mg | ORAL_CAPSULE | ORAL | Status: DC
Start: 2015-06-27 — End: 2015-06-28
  Administered 2015-06-27: 1334 mg via ORAL
  Filled 2015-06-27: qty 2

## 2015-06-27 MED ORDER — CALCIUM ACETATE (PHOS BINDER) 667 MG PO CAPS
2001.0000 mg | ORAL_CAPSULE | Freq: Three times a day (TID) | ORAL | Status: DC
  Administered 2015-06-28 (×2): 2001 mg via ORAL
  Filled 2015-06-27 (×2): qty 3

## 2015-06-27 MED ORDER — PANTOPRAZOLE SODIUM 40 MG PO TBEC
40.0000 mg | DELAYED_RELEASE_TABLET | Freq: Every day | ORAL | Status: DC
  Administered 2015-06-28 – 2015-07-04 (×7): 40 mg via ORAL
  Filled 2015-06-27 (×7): qty 1

## 2015-06-27 MED ORDER — RENA-VITE PO TABS
1.0000 | ORAL_TABLET | Freq: Every day | ORAL | Status: DC
  Administered 2015-06-27 – 2015-07-04 (×8): 1 via ORAL
  Filled 2015-06-27 (×8): qty 1

## 2015-06-27 MED ORDER — CARVEDILOL 12.5 MG PO TABS
12.5000 mg | ORAL_TABLET | ORAL | Status: DC
  Administered 2015-06-28 – 2015-07-05 (×5): 12.5 mg via ORAL
  Filled 2015-06-27 (×6): qty 1

## 2015-06-27 MED ORDER — AMIODARONE HCL IN DEXTROSE 360-4.14 MG/200ML-% IV SOLN
30.0000 mg/h | INTRAVENOUS | Status: DC
  Administered 2015-06-28: 30 mg/h via INTRAVENOUS
  Filled 2015-06-27 (×2): qty 200

## 2015-06-27 MED ORDER — AMIODARONE HCL IN DEXTROSE 360-4.14 MG/200ML-% IV SOLN
60.0000 mg/h | INTRAVENOUS | Status: DC
  Administered 2015-06-27: 60 mg/h via INTRAVENOUS
  Filled 2015-06-27: qty 200

## 2015-06-27 MED ORDER — HEPARIN BOLUS VIA INFUSION
4000.0000 [IU] | Freq: Once | INTRAVENOUS | Status: AC
  Administered 2015-06-27: 4000 [IU] via INTRAVENOUS
  Filled 2015-06-27: qty 4000

## 2015-06-27 MED ORDER — LIDOCAINE-PRILOCAINE 2.5-2.5 % EX CREA: 1.0000 "application " | TOPICAL_CREAM | CUTANEOUS | Status: DC

## 2015-06-27 MED ORDER — CALCIUM ACETATE 667 MG PO CAPS: 1334.0000 mg | ORAL_CAPSULE | ORAL | Status: DC

## 2015-06-27 MED ORDER — HEPARIN (PORCINE) IN NACL 100-0.45 UNIT/ML-% IJ SOLN
1600.0000 [IU]/h | INTRAMUSCULAR | Status: DC
  Administered 2015-06-27 – 2015-07-02 (×8): 1400 [IU]/h via INTRAVENOUS
  Administered 2015-07-03 – 2015-07-04 (×3): 1600 [IU]/h via INTRAVENOUS
  Filled 2015-06-27 (×11): qty 250

## 2015-06-27 MED ORDER — OXYCODONE-ACETAMINOPHEN 5-325 MG PO TABS
1.0000 | ORAL_TABLET | Freq: Every evening | ORAL | Status: DC | PRN
  Administered 2015-06-27 – 2015-06-28 (×2): 1 via ORAL
  Filled 2015-06-27 (×2): qty 1

## 2015-06-27 MED ORDER — SODIUM CHLORIDE 0.9 % IV SOLN: 250.0000 mL | INTRAVENOUS | Status: DC | PRN

## 2015-06-27 MED ORDER — CHOLECALCIFEROL 10 MCG (400 UNIT) PO TABS
400.0000 [IU] | ORAL_TABLET | Freq: Every day | ORAL | Status: DC
  Filled 2015-06-27 (×2): qty 1

## 2015-06-27 MED ORDER — CARVEDILOL 12.5 MG PO TABS: 12.5000 mg | ORAL_TABLET | ORAL | Status: DC

## 2015-06-27 MED ORDER — AMLODIPINE BESYLATE 10 MG PO TABS
10.0000 mg | ORAL_TABLET | Freq: Every day | ORAL | Status: DC
  Administered 2015-06-27 – 2015-06-28 (×2): 10 mg via ORAL
  Filled 2015-06-27 (×2): qty 1

## 2015-06-27 MED ORDER — SODIUM CHLORIDE 0.9% FLUSH: 3.0000 mL | INTRAVENOUS | Status: DC | PRN

## 2015-06-27 MED ORDER — CINACALCET HCL 30 MG PO TABS
90.0000 mg | ORAL_TABLET | ORAL | Status: DC
Start: 2015-06-28 — End: 2015-06-28
  Administered 2015-06-28: 90 mg via ORAL
  Filled 2015-06-27 (×2): qty 3

## 2015-06-27 MED ORDER — SODIUM CHLORIDE 0.9% FLUSH
3.0000 mL | Freq: Two times a day (BID) | INTRAVENOUS | Status: DC
  Administered 2015-06-27 – 2015-07-05 (×8): 3 mL via INTRAVENOUS

## 2015-06-27 MED ORDER — ASPIRIN 325 MG PO TABS
325.0000 mg | ORAL_TABLET | Freq: Every day | ORAL | Status: DC
  Administered 2015-06-28 – 2015-07-03 (×6): 325 mg via ORAL
  Filled 2015-06-27 (×6): qty 1

## 2015-06-27 MED ORDER — NITROGLYCERIN 0.4 MG SL SUBL: 0.4000 mg | SUBLINGUAL_TABLET | SUBLINGUAL | Status: DC | PRN

## 2015-06-27 MED ORDER — CARVEDILOL 12.5 MG PO TABS
12.5000 mg | ORAL_TABLET | ORAL | Status: DC
  Administered 2015-06-27 – 2015-07-04 (×4): 12.5 mg via ORAL
  Filled 2015-06-27 (×4): qty 1

## 2015-06-27 NOTE — Progress Notes (Signed)
ANTICOAGULATION CONSULT NOTE - Initial Consult  Pharmacy Consult for heparin Indication: aflutter  Allergies  Allergen Reactions  . Ace Inhibitors Cough    Patient Measurements: Height: 6\' 1"  (185.4 cm) Weight: 242 lb 4.8 oz (109.907 kg) IBW/kg (Calculated) : 79.9 Heparin Dosing Weight: 99 kg  Vital Signs: Temp: 97.6 F (36.4 C) (02/03 1510) Temp Source: Oral (02/03 1510) BP: 122/95 mmHg (02/03 1504)  Labs: No results for input(s): HGB, HCT, PLT, APTT, LABPROT, INR, HEPARINUNFRC, HEPRLOWMOCWT, CREATININE, CKTOTAL, CKMB, TROPONINI in the last 72 hours.  CrCl cannot be calculated (Patient has no serum creatinine result on file.).   Medical History: Past Medical History  Diagnosis Date  . CHF (congestive heart failure) ([place])   . History of unilateral nephrectomy   . Hypertension   . Cardiomyopathy   . Gout   . Myocardial infarction [facility]) ?2006  . Shortness of breath 05/19/11    "at rest, lying down, w/exertion"  . Umbilical hernia [id number]    unrepaired  . Stroke [facility]) 02/2011    05/19/11 denies residual  . Dialysis patient ([place])     Mon-Wed-Fri  . Diabetes mellitus     pt. states he's borderline diabetic., no longer taking med,- off med. since 2013  . Renal cell carcinoma     dialysis- MWF  . ESRD (end stage renal disease) ([place])   . Coronary artery disease     normal coronaries by 10/10/08 cath  . History of nephrectomy 07/04/2012    History of right nephrectomy in 2002 for renal cell carcinoma   . 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 Renal insufficiency     Medications:  Scheduled:  . amiodarone  150 mg Intravenous Once   Infusions:  . amiodarone     Followed by  . amiodarone      Assessment: 55 yo male with aflutter will be started on heparin.  Per patient, he was not on any anticoagulation prior to admission.  Historic platelet is good.  Patient has history of renal dysufnction  Goal of Therapy:  Heparin level 0.3-0.7  units/ml Monitor platelets by anticoagulation protocol: Yes   Plan:  - Heparin 4000 units bolus x1, then start heparin drip at 1400 units/hr - 8hr heparin level - daily heparin level and CBC  Nieshia Larmon, Tsz-Yin 06/27/2015,3:30 PM

## 2015-06-27 NOTE — H&P (Signed)
Referring Physician:  [name] is an 55 y.o. male.                       Chief Complaint: Palpitation with weakness  HPI: 55 year old male with new onset atrial flutter with rapid ventricular response has weak feeling x 2 days. No chest pain or dizziness. Past medical history of end-stage renal disease on dialysis (M, W, F) type 2 diabetes mellitus, systolic CHF with EF of 01% and diverticulitis. Dilated non-ischemic cardiomyopathy.  Past Medical History  Diagnosis Date  . CHF (congestive heart failure) ([place])   . History of unilateral nephrectomy   . Hypertension   . Cardiomyopathy   . Gout   . Myocardial infarction [facility]) ?2006  . Shortness of breath 05/19/11    "at rest, lying down, w/exertion"  . Umbilical hernia 74/94/49    unrepaired  . Stroke [facility]) 02/2011    05/19/11 denies residual  . Dialysis patient ([place])     Mon-Wed-Fri  . Diabetes mellitus     pt. states he's borderline diabetic., no longer taking med,- off med. since 2013  . Renal cell carcinoma     dialysis- MWF  . ESRD (end stage renal disease) ([place])   . Coronary artery disease     normal coronaries by 10/10/08 cath  . History of nephrectomy 07/04/2012    History of right nephrectomy in 2002 for renal cell carcinoma   . 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 Renal insufficiency       Past Surgical History  Procedure Laterality Date  . Nephrectomy  2000    right  . Av fistula placement  03/29/2011    Procedure: ARTERIOVENOUS (AV) FISTULA CREATION;  Surgeon: Hinda Lenis, MD;  Location: [place];  Service: Vascular;  Laterality: Left;  LEFT RADIOCEPHALIC , Arteriovenous [id number])  . Smashed  1990's    "left pinky; have a plate in there"  . US echocardiography  05/20/11  . Cardiac catheterization  05/21/11  . Finger surgery      L pinkie finger- ORIF- /w remaining hardware - 1990's    . Umbilical hernia repair N/A 07/04/2012    Procedure: LAPAROSCOPIC UMBILICAL HERNIA;   Surgeon: Madilyn Hook, DO;  Location: [place];  Service: General;  Laterality: N/A;  . Insertion of mesh N/A 07/04/2012    Procedure: INSERTION OF MESH;  Surgeon: Madilyn Hook, DO;  Location: [place];  Service: General;  Laterality: N/A;  . Hernia repair N/A 9/93/5701    Umbilical hernia repair  . Dialysis cath placed    . Revison of arteriovenous fistula Left 06/05/2013    Procedure: REVISON OF LEFT RADIOCEPHALIC  ARTERIOVENOUS FISTULA;  Surgeon: Conrad [place], MD;  Location: [place];  Service: Vascular;  Laterality: Left;  . Right heart catheterization N/A 05/21/2011    Procedure: RIGHT HEART CATH;  Surgeon: Birdie Riddle, MD;  Location: [facility] CATH LAB;  Service: Cardiovascular;  Laterality: N/A;  . Left and right heart catheterization with coronary angiogram N/A 08/04/2012    Procedure: LEFT AND RIGHT HEART CATHETERIZATION WITH CORONARY ANGIOGRAM;  Surgeon: Birdie Riddle, MD;  Location: [place] CATH LAB;  Service: Cardiovascular;  Laterality: N/A;    Family History  Problem Relation Age of Onset  . Hypertension Mother   . Kidney disease Mother    Social History:  reports that he has never smoked. He has never used smokeless tobacco. He  reports that he does not drink alcohol or use illicit drugs.  Allergies:  Allergies  Allergen Reactions  . Ace Inhibitors Cough    Medications Prior to Admission  Medication Sig Dispense Refill  . amLODipine (NORVASC) 10 MG tablet Take 10 mg by mouth daily.     Marland Kitchen aspirin 325 MG tablet Take 325 mg by mouth daily.     Marland Kitchen b complex-vitamin c-folic acid (NEPHRO-VITE) 0.8 MG TABS tablet Take 1 tablet by mouth daily with supper.     . calcium acetate (PHOSLO) 667 MG capsule Take 1,334-2,001 mg by mouth See admin instructions. Take 3 capsules (2001 mg) by mouth with meals and 2 capsules (1334 mg) with snacks    . carvedilol (COREG) 25 MG tablet Take 0.5 tablets (12.5 mg total) by mouth 2 (two) times daily with a meal. (Patient taking differently: Take 12.5 mg by mouth See  admin instructions. Take 1/2 tablet (12.5 mg) by mouth in the evening on dialysis (Monday, Wednesday, Friday and 1/2 tablet (12.5 mg) twice daily on Tuesday, Thursday, Saturday, Sunday (non-dialysis days)) 60 tablet 0  . Cholecalciferol (VITAMIN D PO) Take 1 tablet by mouth daily.    . cinacalcet (SENSIPAR) 90 MG tablet Take 90 mg by mouth 4 (four) times a week. Tuesday, Thursday, Saturday, Sunday - non-dialysis days    . isosorbide dinitrate (ISORDIL) 20 MG tablet Take 20 mg by mouth daily.     Marland Kitchen lidocaine-prilocaine (EMLA) cream Apply 1 application topically every Monday, Wednesday, and Friday. Apply to port access prior to dialysis  12  . nitroGLYCERIN (NITROSTAT) 0.4 MG SL tablet Place 0.4 mg under the tongue every 5 (five) minutes as needed for chest pain.   0  . oxyCODONE-acetaminophen (PERCOCET/ROXICET) 5-325 MG per tablet Take 1 tablet by mouth at bedtime as needed for moderate pain. (Patient taking differently: Take 1 tablet by mouth at bedtime as needed (gout pain). ) 20 tablet 0  . pantoprazole (PROTONIX) 40 MG tablet Take 1 tablet (40 mg total) by mouth daily at 12 noon. (Patient taking differently: Take 40 mg by mouth daily. ) 30 tablet 0  . Nutritional Supplements (FEEDING SUPPLEMENT, NEPRO CARB STEADY,) LIQD Take 237 mLs by mouth 2 (two) times daily between meals. (Patient not taking: Reported on 06/27/2015) 60 Can 0    Results for orders placed or performed during the hospital encounter of 06/27/15 (from the past 48 hour(s))  CBC with Differential/Platelet     Status: Abnormal   Collection Time: 06/27/15  4:19 PM  Result Value Ref Range   WBC 4.4 4.0 - 10.5 K/uL   RBC 3.77 (L) 4.22 - 5.81 MIL/uL   Hemoglobin 11.8 (L) 13.0 - 17.0 g/dL   HCT 35.4 (L) 39.0 - 52.0 %   MCV 93.9 78.0 - 100.0 fL   MCH 31.3 26.0 - 34.0 pg   MCHC 33.3 30.0 - 36.0 g/dL   RDW 16.0 (H) 11.5 - 15.5 %   Platelets 175 150 - 400 K/uL   Neutrophils Relative % 56 %   Neutro Abs 2.5 1.7 - 7.7 K/uL   Lymphocytes  Relative 29 %   Lymphs Abs 1.3 0.7 - 4.0 K/uL   Monocytes Relative 10 %   Monocytes Absolute 0.4 0.1 - 1.0 K/uL   Eosinophils Relative 5 %   Eosinophils Absolute 0.2 0.0 - 0.7 K/uL   Basophils Relative 0 %   Basophils Absolute 0.0 0.0 - 0.1 K/uL  Comprehensive metabolic panel     Status: Abnormal  Collection Time: 06/27/15  4:19 PM  Result Value Ref Range   Sodium 141 135 - 145 mmol/L   Potassium 3.9 3.5 - 5.1 mmol/L   Chloride 98 (L) 101 - 111 mmol/L   CO2 30 22 - 32 mmol/L   Glucose, Bld 113 (H) 65 - 99 mg/dL   BUN 21 (H) 6 - 20 mg/dL   Creatinine, Ser 7.63 (H) 0.61 - 1.24 mg/dL   Calcium 9.8 8.9 - 10.3 mg/dL   Total Protein 7.4 6.5 - 8.1 g/dL   Albumin 3.4 (L) 3.5 - 5.0 g/dL   AST 13 (L) 15 - 41 U/L   ALT 12 (L) 17 - 63 U/L   Alkaline Phosphatase 71 38 - 126 U/L   Total Bilirubin 0.3 0.3 - 1.2 mg/dL   GFR calc non Af Amer 7 (L) >60 mL/min   GFR calc Af Amer 8 (L) >60 mL/min    Comment: (NOTE) The eGFR has been calculated using the CKD EPI equation. This calculation has not been validated in all clinical situations. eGFR's persistently <60 mL/min signify possible Chronic Kidney Disease.    Anion gap 13 5 - 15   Dg Chest Port 1 View  06/27/2015  CLINICAL DATA:  Dyspnea, tachycardia EXAM: PORTABLE CHEST 1 VIEW COMPARISON:  None. FINDINGS: Normal cardiac silhouette. Central venous congestion is present. No effusion, infiltrate or pneumothorax. No acute osseous abnormality. IMPRESSION: Mild cardiomegaly.  Mild central venous congestion. Electronically Signed   By: Suzy Bouchard M.D.   On: 06/27/2015 16:54    Review Of Systems Constitutional: Negative for fever, weight loss and fatigue.  HENT: Negative for congestion, sore throat, neck pain, neck stiffness and sinus pressure.  Eyes: Negative for visual disturbance.  Respiratory: Positive for shortness of breath. Negative for apnea, cough, choking, chest tightness and wheezing.  Cardiovascular: Positive for leg  swelling. Negative for chest pain and palpitations.  Gastrointestinal: Positive for nausea, abdominal pain and diarrhea. Negative for vomiting, melena, hematochezia and hematemesis.  Genitourinary: Positive for flank pain. Negative for dysuria.  Musculoskeletal: Negative for myalgias and arthralgias.  Skin: Negative for rash.  Neurological: Negative for facial asymmetry and weakness  Blood pressure 122/95, temperature 97.6 F (36.4 C), temperature source Oral, height '6\' 1"'$  (1.854 m), weight 109.907 kg (242 lb 4.8 oz). Constitutional: He appears well-developed and well-nourished. No respiratory distress.  HENT: Normocephalic and atraumatic. Brown eyes, conj-pale pink, PERL, EOMI. No scleral icterus. Tongue pale pink and dry.  Neck: Normal range of motion. Neck supple. No tracheal deviation present. No thyromegaly present.  Cardiovascular: Normal rate, regular rhythm, normal heart sounds and intact distal pulses. II/VI systolic murmur. Pulmonary/Chest: Effort normal and breath sounds normal. He has no wheezes. He exhibits no tenderness.  Abdominal: Soft. Bowel sounds are normal.  Musculoskeletal: Normal range of motion. He exhibits trace edema and no tenderness.  Neurological: He is alert and oriented to person, place, and time. He has normal reflexes. Coordination normal.  Skin: Skin is warm and dry. No rash noted. He is not diaphoretic. No erythema.  Psychiatric: He has a normal mood and affect. His behavior is normal.   Assessment/Plan Atrial flutter with RVR Chronic left heart failure  ESRD with hemodialysis, MWF.  DM, II  Gout  Obesity CAD H/O Renal cell cancer S/P right nephrectomy  Admit/IV amiodarone/EKG/Echocardiogram  Birdie Riddle, MD  06/27/2015, 6:28 PM

## 2015-06-28 ENCOUNTER — Inpatient Hospital Stay (HOSPITAL_COMMUNITY): Payer: Medicare Other

## 2015-06-28 ENCOUNTER — Encounter (HOSPITAL_COMMUNITY): Payer: Self-pay

## 2015-06-28 LAB — CBC
HCT: 35.7 % — ABNORMAL LOW (ref 39.0–52.0)
HCT: 38.2 % — ABNORMAL LOW (ref 39.0–52.0)
Hemoglobin: 11.6 g/dL — ABNORMAL LOW (ref 13.0–17.0)
Hemoglobin: 12.3 g/dL — ABNORMAL LOW (ref 13.0–17.0)
MCH: 31.1 pg (ref 26.0–34.0)
MCH: 30.9 pg (ref 26.0–34.0)
MCHC: 32.5 g/dL (ref 30.0–36.0)
MCHC: 32.2 g/dL (ref 30.0–36.0)
MCV: 95.7 fL (ref 78.0–100.0)
MCV: 96 fL (ref 78.0–100.0)
Platelets: 176 10*3/uL (ref 150–400)
Platelets: 181 10*3/uL (ref 150–400)
RBC: 3.73 MIL/uL — ABNORMAL LOW (ref 4.22–5.81)
RBC: 3.98 MIL/uL — ABNORMAL LOW (ref 4.22–5.81)
RDW: 16.5 % — ABNORMAL HIGH (ref 11.5–15.5)
RDW: 16.6 % — ABNORMAL HIGH (ref 11.5–15.5)
WBC: 5.7 10*3/uL (ref 4.0–10.5)
WBC: 5.5 10*3/uL (ref 4.0–10.5)

## 2015-06-28 LAB — PROTIME-INR
INR: 1.1 (ref 0.00–1.49)
Prothrombin Time: 14.4 seconds (ref 11.6–15.2)

## 2015-06-28 LAB — BASIC METABOLIC PANEL
Anion gap: 14 (ref 5–15)
BUN: 29 mg/dL — ABNORMAL HIGH (ref 6–20)
CO2: 28 mmol/L (ref 22–32)
Calcium: 10.3 mg/dL (ref 8.9–10.3)
Chloride: 93 mmol/L — ABNORMAL LOW (ref 101–111)
Creatinine, Ser: 8.68 mg/dL — ABNORMAL HIGH (ref 0.61–1.24)
GFR calc Af Amer: 7 mL/min — ABNORMAL LOW (ref >60)
GFR calc non Af Amer: 6 mL/min — ABNORMAL LOW (ref >60)
Glucose, Bld: 162 mg/dL — ABNORMAL HIGH (ref 65–99)
Potassium: 4.8 mmol/L (ref 3.5–5.1)
Sodium: 135 mmol/L (ref 135–145)

## 2015-06-28 LAB — HEPARIN LEVEL (UNFRACTIONATED)
Heparin Unfractionated: 0.34 IU/mL (ref 0.30–0.70)
Heparin Unfractionated: 0.43 IU/mL (ref 0.30–0.70)

## 2015-06-28 LAB — LIPASE, BLOOD: Lipase: 25 U/L (ref 11–51)

## 2015-06-28 MED ORDER — ONDANSETRON HCL 4 MG/2ML IJ SOLN
4.0000 mg | Freq: Once | INTRAMUSCULAR | Status: AC
  Administered 2015-06-28: 4 mg via INTRAVENOUS
  Filled 2015-06-28: qty 2

## 2015-06-28 MED ORDER — OFF THE BEAT BOOK
Freq: Once | Status: DC
  Filled 2015-06-28: qty 1

## 2015-06-28 MED ORDER — SEVELAMER CARBONATE 800 MG PO TABS
2400.0000 mg | ORAL_TABLET | Freq: Three times a day (TID) | ORAL | Status: DC
  Administered 2015-06-28 – 2015-07-05 (×16): 2400 mg via ORAL
  Filled 2015-06-28 (×19): qty 3

## 2015-06-28 MED ORDER — SEVELAMER CARBONATE 800 MG PO TABS
1600.0000 mg | ORAL_TABLET | ORAL | Status: DC
  Filled 2015-06-28 (×3): qty 2

## 2015-06-28 MED ORDER — AMLODIPINE BESYLATE 5 MG PO TABS
5.0000 mg | ORAL_TABLET | Freq: Every day | ORAL | Status: DC
  Administered 2015-06-29: 5 mg via ORAL
  Filled 2015-06-28: qty 1

## 2015-06-28 MED ORDER — VITAMIN D 1000 UNITS PO TABS
1000.0000 [IU] | ORAL_TABLET | Freq: Every day | ORAL | Status: DC
  Administered 2015-06-28: 1000 [IU] via ORAL
  Filled 2015-06-28 (×2): qty 1

## 2015-06-28 MED ORDER — CINACALCET HCL 30 MG PO TABS
90.0000 mg | ORAL_TABLET | Freq: Every evening | ORAL | Status: DC
  Administered 2015-06-29 – 2015-07-04 (×6): 90 mg via ORAL
  Filled 2015-06-28 (×7): qty 3

## 2015-06-28 MED ORDER — AMIODARONE HCL 200 MG PO TABS
400.0000 mg | ORAL_TABLET | Freq: Two times a day (BID) | ORAL | Status: DC
  Administered 2015-06-28 – 2015-06-29 (×3): 400 mg via ORAL
  Filled 2015-06-28 (×3): qty 2

## 2015-06-28 MED ORDER — WARFARIN SODIUM 2.5 MG PO TABS
2.5000 mg | ORAL_TABLET | Freq: Once | ORAL | Status: AC
  Administered 2015-06-28: 2.5 mg via ORAL
  Filled 2015-06-28: qty 1

## 2015-06-28 MED ORDER — SIMETHICONE 80 MG PO CHEW
80.0000 mg | CHEWABLE_TABLET | Freq: Three times a day (TID) | ORAL | Status: DC | PRN
  Administered 2015-06-28: 80 mg via ORAL
  Filled 2015-06-28 (×2): qty 1

## 2015-06-28 MED ORDER — WARFARIN - PHARMACIST DOSING INPATIENT: Freq: Every day | Status: DC

## 2015-06-28 MED ORDER — OXYCODONE-ACETAMINOPHEN 5-325 MG PO TABS
1.0000 | ORAL_TABLET | Freq: Four times a day (QID) | ORAL | Status: DC | PRN
  Administered 2015-06-29 – 2015-07-03 (×9): 1 via ORAL
  Filled 2015-06-28 (×11): qty 1

## 2015-06-28 MED ORDER — ONDANSETRON HCL 4 MG/2ML IJ SOLN: 4.0000 mg | Freq: Three times a day (TID) | INTRAMUSCULAR | Status: DC | PRN

## 2015-06-28 NOTE — Plan of Care (Signed)
Problem: Education: Goal: Knowledge of disease or condition will improve Outcome: Progressing Assess educational needs r/t Aflutter and Anticoagulation Goal: Understanding of medication regimen will improve Outcome: Progressing Assess needs for education of medication r/t rate control and anticoagulation.

## 2015-06-28 NOTE — Consult Note (Signed)
[facility] Renal Consultation Note    Indication for Consultation:  Management of ESRD/hemodialysis; anemia, hypertension/volume and secondary hyperparathyroidism PCP:  HPI: [name] is a 55 y.o. male with ESRD on hemodialysis at [facility] on MWF schedule. PHM significant for to hypertension, DMT2, renal cell carcinoma with R nephrectomy in 2002, CAD, MI Cardiomyopathy,  Systolic HF with EF [id number] (08/16) Patient was attending his scheduled HD treatment when HD technician noted his HR to be elevated (per clinic VS, HR was 139). He presented to [facility] where he was found to have new onset of AFIB/Aflutter with RVR. He was seen by cardiology, started on amiodarone drip and heparin drip. Pharmacy was consulted to initiate coumadin ([id number] 6). CXR showed mild venous congestion. He has since converted to SR with HR in 70s. Patient states that he was unaware of increased HR until the technician mentioned it to him. Denies chest pain, sob, palpitations, has had DOE and has been asking to be challenged due to SOB after HD treatments. No fevers, chills, states he had some diarrhea and vomiting at first of week but went away without treatment. No C/O abdominal pain, tarry or bloody stools,  flank pain, dysuria, HA, dizziness, vertigo, tinnitus, vision changes. Currently sitting up in chair without C/O SOB/Chest pain. SR on monitor.  IV Amiodarone has been stopped. He feels fine today   Patient has HD at [facility]. He is compliant with therapy. Last incenter lab values as follows: HGB 10.9 (06/25/15)  Ca 9.9 C Ca 10.1 Phos 6.9 PTH 1506 (06/18/15) He is taking calcium acetate and sensipar for phos/PTH suppression.   Past Medical History  Diagnosis Date  . CHF (congestive heart failure) ([place])   . History of unilateral nephrectomy   . Hypertension   . Cardiomyopathy   . Gout   . Myocardial infarction [facility]) ?2006  . Shortness of breath 05/19/11    "at rest, lying down,  w/exertion"  . Umbilical hernia [id number]    unrepaired  . Stroke [facility]) 02/2011    05/19/11 denies residual  . Dialysis patient ([place])     Mon-Wed-Fri  . Diabetes mellitus     pt. states he's borderline diabetic., no longer taking med,- off med. since 2013  . Renal cell carcinoma     dialysis- MWF  . ESRD (end stage renal disease) ([place])   . Coronary artery disease     normal coronaries by 10/10/08 cath  . History of nephrectomy 07/04/2012    History of right nephrectomy in 2002 for renal cell carcinoma   . 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 Renal insufficiency    Past Surgical History  Procedure Laterality Date  . Nephrectomy  2000    right  . Av fistula placement  03/29/2011    Procedure: ARTERIOVENOUS (AV) FISTULA CREATION;  Surgeon: Hinda Lenis, MD;  Location: [place];  Service: Vascular;  Laterality: Left;  LEFT RADIOCEPHALIC , Arteriovenous [id number])  . Smashed  1990's    "left pinky; have a plate in there"  . US echocardiography  05/20/11  . Cardiac catheterization  05/21/11  . Finger surgery      L pinkie finger- ORIF- /w remaining hardware - 1990's    . Umbilical hernia repair N/A 07/04/2012    Procedure: LAPAROSCOPIC UMBILICAL HERNIA;  Surgeon: Madilyn Hook, DO;  Location: [place];  Service: General;  Laterality: N/A;  . Insertion of mesh N/A 07/04/2012  Procedure: INSERTION OF MESH;  Surgeon: Madilyn Hook, DO;  Location: [place];  Service: General;  Laterality: N/A;  . Hernia repair N/A [id number]    Umbilical hernia repair  . Dialysis cath placed    . Revison of arteriovenous fistula Left 06/05/2013    Procedure: REVISON OF LEFT RADIOCEPHALIC  ARTERIOVENOUS FISTULA;  Surgeon: Conrad [place], MD;  Location: [place];  Service: Vascular;  Laterality: Left;  . Right heart catheterization N/A 05/21/2011    Procedure: RIGHT HEART CATH;  Surgeon: Birdie Riddle, MD;  Location: [facility] CATH LAB;  Service: Cardiovascular;  Laterality: N/A;  . Left and  right heart catheterization with coronary angiogram N/A 08/04/2012    Procedure: LEFT AND RIGHT HEART CATHETERIZATION WITH CORONARY ANGIOGRAM;  Surgeon: Birdie Riddle, MD;  Location: [place] CATH LAB;  Service: Cardiovascular;  Laterality: N/A;   Family History  Problem Relation Age of Onset  . Hypertension Mother   . Kidney disease Mother    Social History:  reports that he has never smoked. He has never used smokeless tobacco. He reports that he does not drink alcohol or use illicit drugs. Allergies  Allergen Reactions  . Ace Inhibitors Cough   Prior to Admission medications   Medication Sig Start Date End Date Taking? Authorizing Provider  amLODipine (NORVASC) 10 MG tablet Take 10 mg by mouth daily.    Yes Historical Provider, MD  aspirin 325 MG tablet Take 325 mg by mouth daily.    Yes Historical Provider, MD  b complex-vitamin c-folic acid (NEPHRO-VITE) 0.8 MG TABS tablet Take 1 tablet by mouth daily with supper.    Yes Historical Provider, MD  calcium acetate (PHOSLO) 667 MG capsule Take 1,334-2,001 mg by mouth See admin instructions. Take 3 capsules (2001 mg) by mouth with meals and 2 capsules (1334 mg) with snacks   Yes Historical Provider, MD  carvedilol (COREG) 25 MG tablet Take 0.5 tablets (12.5 mg total) by mouth 2 (two) times daily with a meal. Patient taking differently: Take 12.5 mg by mouth See admin instructions. Take 1/2 tablet (12.5 mg) by mouth in the evening on dialysis (Monday, Wednesday, Friday and 1/2 tablet (12.5 mg) twice daily on Tuesday, Thursday, Saturday, Sunday (non-dialysis days) 01/16/15  Yes Nishant Dhungel, MD  Cholecalciferol (VITAMIN D PO) Take 1 tablet by mouth daily.   Yes Historical Provider, MD  cinacalcet (SENSIPAR) 90 MG tablet Take 90 mg by mouth 4 (four) times a week. Tuesday, Thursday, Saturday, Sunday - non-dialysis days   Yes Historical Provider, MD  isosorbide dinitrate (ISORDIL) 20 MG tablet Take 20 mg by mouth daily.    Yes Historical Provider, MD   lidocaine-prilocaine (EMLA) cream Apply 1 application topically every Monday, Wednesday, and Friday. Apply to port access prior to dialysis 04/08/15  Yes Historical Provider, MD  nitroGLYCERIN (NITROSTAT) 0.4 MG SL tablet Place 0.4 mg under the tongue every 5 (five) minutes as needed for chest pain.  05/20/15  Yes Historical Provider, MD  oxyCODONE-acetaminophen (PERCOCET/ROXICET) 5-325 MG per tablet Take 1 tablet by mouth at bedtime as needed for moderate pain. Patient taking differently: Take 1 tablet by mouth at bedtime as needed (gout pain).  12/20/14  Yes Maryann Mikhail, DO  pantoprazole (PROTONIX) 40 MG tablet Take 1 tablet (40 mg total) by mouth daily at 12 noon. Patient taking differently: Take 40 mg by mouth daily.  01/16/15  Yes Nishant Dhungel, MD  Nutritional Supplements (FEEDING SUPPLEMENT, NEPRO CARB STEADY,) LIQD Take 237 mLs by mouth 2 (two)  times daily between meals. Patient not taking: Reported on 06/27/2015 01/16/15   Louellen Molder, MD   Current Facility-Administered Medications  Medication Dose Route Frequency Provider Last Rate Last Dose  . 0.9 %  sodium chloride infusion  250 mL Intravenous PRN Dixie Dials, MD      . amiodarone (PACERONE) tablet 400 mg  400 mg Oral BID Charolette Forward, MD   400 mg at 06/28/15 1154  . [START ON 06/29/2015] amLODipine (NORVASC) tablet 5 mg  5 mg Oral Daily Charolette Forward, MD      . aspirin tablet 325 mg  325 mg Oral Daily Dixie Dials, MD   325 mg at 06/28/15 0845  . calcium acetate (PHOSLO) capsule 2,001 mg  2,001 mg Oral TID WC Dixie Dials, MD   2,001 mg at 06/28/15 1155   And  . calcium acetate (PHOSLO) capsule 1,334 mg  1,334 mg Oral With snacks Dixie Dials, MD   1,334 mg at 06/27/15 2156  . carvedilol (COREG) tablet 12.5 mg  12.5 mg Oral Q M,W,F-2000 Dixie Dials, MD   12.5 mg at 06/27/15 2157   And  . carvedilol (COREG) tablet 12.5 mg  12.5 mg Oral Q T,Th,S,Su Dixie Dials, MD   12.5 mg at 06/28/15 0846   And  . carvedilol (COREG) tablet  12.5 mg  12.5 mg Oral Q T,Th,S,Su Dixie Dials, MD      . cholecalciferol (VITAMIN D) tablet 1,000 Units  1,000 Units Oral Daily Dixie Dials, MD   1,000 Units at 06/28/15 0844  . cinacalcet (SENSIPAR) tablet 90 mg  90 mg Oral Once per day on Sun Tue Thu Sat Dixie Dials, MD   90 mg at 06/28/15 1159  . heparin ADULT infusion 100 units/mL (25000 units/250 mL)  1,400 Units/hr Intravenous Continuous Dixie Dials, MD 14 mL/hr at 06/28/15 0510 1,400 Units/hr at 06/28/15 0510  . [START ON 06/30/2015] lidocaine-prilocaine (EMLA) cream 1 application  1 application Topical Q M,W,F Dixie Dials, MD      . multivitamin (RENA-VIT) tablet 1 tablet  1 tablet Oral QHS Dixie Dials, MD   1 tablet at 06/27/15 2156  . nitroGLYCERIN (NITROSTAT) SL tablet 0.4 mg  0.4 mg Sublingual Q5 min PRN Dixie Dials, MD      . ondansetron (ZOFRAN) injection 4 mg  4 mg Intravenous Q8H PRN Charolette Forward, MD      . oxyCODONE-acetaminophen (PERCOCET/ROXICET) 5-325 MG per tablet 1 tablet  1 tablet Oral QHS PRN Dixie Dials, MD   1 tablet at 06/27/15 2244  . pantoprazole (PROTONIX) EC tablet 40 mg  40 mg Oral Q1200 Dixie Dials, MD   40 mg at 06/28/15 1155  . sodium chloride flush (NS) 0.9 % injection 3 mL  3 mL Intravenous PRN Dixie Dials, MD      . sodium chloride flush (NS) 0.9 % injection 3 mL  3 mL Intravenous Q12H Dixie Dials, MD   3 mL at 06/27/15 2200  . warfarin (COUMADIN) tablet 2.5 mg  2.5 mg Oral ONCE-1800 Kelsy E Combs, RPH      . Warfarin - Pharmacist Dosing Inpatient   Does not apply q1800 Kem Parkinson, RPH   0  at 06/28/15 1800   Labs: Basic Metabolic Panel:  Recent Labs Lab 06/27/15 1619 06/28/15 0444  NA 141 135  K 3.9 4.8  CL 98* 93*  CO2 30 28  GLUCOSE 113* 162*  BUN 21* 29*  CREATININE 7.63* 8.68*  CALCIUM 9.8 10.3   Liver Function Tests:  Recent Labs Lab 06/27/15 1619  AST 13*  ALT 12*  ALKPHOS 71  BILITOT 0.3  PROT 7.4  ALBUMIN 3.4*   No results for input(s): LIPASE, AMYLASE in the last  168 hours. No results for input(s): AMMONIA in the last 168 hours. CBC:  Recent Labs Lab 06/27/15 1619 06/28/15 0444  WBC 4.4 5.7  NEUTROABS 2.5  --   HGB 11.8* 11.6*  HCT 35.4* 35.7*  MCV 93.9 95.7  PLT 175 176   Cardiac Enzymes: No results for input(s): CKTOTAL, CKMB, CKMBINDEX, TROPONINI in the last 168 hours. CBG: No results for input(s): GLUCAP in the last 168 hours. Iron Studies: No results for input(s): IRON, TIBC, TRANSFERRIN, FERRITIN in the last 72 hours. Studies/Results: Dg Chest Port 1 View  06/27/2015  CLINICAL DATA:  Dyspnea, tachycardia EXAM: PORTABLE CHEST 1 VIEW COMPARISON:  None. FINDINGS: Normal cardiac silhouette. Central venous congestion is present. No effusion, infiltrate or pneumothorax. No acute osseous abnormality. IMPRESSION: Mild cardiomegaly.  Mild central venous congestion. Electronically Signed   By: Suzy Bouchard M.D.   On: 06/27/2015 16:54    ROS: As per HPI otherwise negative.   Physical Exam: Filed Vitals:   06/27/15 1512 06/27/15 2122 06/28/15 0527 06/28/15 1213  BP:  114/88 101/75 107/77  Pulse:  129 79 81  Temp:  98.1 F (36.7 C) 97.6 F (36.4 C) 97.4 F (36.3 C)  TempSrc:  Oral Oral Oral  Resp:  18 16 16   Height: 6\' 1"  (1.854 m)     Weight: 109.907 kg (242 lb 4.8 oz)  111.041 kg (244 lb 12.8 oz)   SpO2:  100% 100% 100%     General: Well developed, well nourished, in no acute distress. Head: Normocephalic, atraumatic, sclera non-icteric, mucus membranes are moist Neck: Supple. JVD not elevated. Lungs: Clear bilaterally to auscultation without wheezes, rales, or rhonchi. Breathing is unlabored. Heart: RRR with S1 S2. II/VI systolic M.  Abdomen: Soft, non-tender, non-distended with normoactive bowel sounds. No rebound/guarding. No obvious abdominal masses. M-S:  Strength and tone appear normal for age. Lower extremities: Trace edema RLE. RLE> LLE Neuro: Alert and oriented X 3. Moves all extremities spontaneously. Psych:   Responds to questions appropriately with a normal affect. Dialysis Access: LUA AVF + thrill + bruit (recent intervention at [facility]) Has small denuded area on lower portion of AVF, no drainage. Monitor.  Dialysis Orders: Center: [facility]  on MWF . EDW 110 HD [place] 2.0 K 2.25 Ca  Time 4 hours  Heparin 12000 units at start of tx, 4000 units mid run q treatment. Access :LUA AVF BFR 450 DFR 800    Mircera 50 mcg IV q 2 weeks ( last dose 06/25/15)  Assessment/Plan: 1.  AFib/Aflutter with RVR: has converted to SR. Started on heparin/bridging to coumadin. Per cardiology. Oral Amiodarone. 2.  ESRD -  MWF at [facility]. Next HD 06/30/15. K+4.8. Use 2.0 K.  3.  Hypertension/volume  - BP well controlled. On amlodipine and carvedilol. Mild venous congestion on CXR. Has been lowering EDW by 0.5 kg incenter however EDW has not been adjusted. Will challenge as tolerated Monday and lower EDW.  4.  Anemia  -Hgb 11.6, rec'd ESA dose this week. Follow HGB 5.  Metabolic bone disease - continue binders, sensipar. Ca 10.3 C Ca 10.8  Has had issues with hypercalcemia in past-monitor calcium. Change to non-calcium binders, DC oral Vit D.    6.   Nutrition - Albumin 3.4, renal diet, renal vit  Rita H. Owens Shark,  NP-C 06/28/2015, 12:44 PM  [place] [phone]  Patient seen and examined, agree with above note with above modifications. 55 year old BM with ESRD- admitted with A flutter on Friday- converted quickly- now being anticoagulated.  We will arrange his HD for in the AM - really no complaints today  Corliss Parish, MD 06/29/2015

## 2015-06-28 NOTE — Progress Notes (Addendum)
ANTICOAGULATION CONSULT NOTE - Follow Up Consult  Pharmacy Consult for Heparin --> Warfarin  Indication: A-flutter / Afib  Labs:  Recent Labs  06/27/15 1619 06/28/15 0010 06/28/15 0444  HGB 11.8*  --  11.6*  HCT 35.4*  --  35.7*  PLT 175  --  176  LABPROT  --   --  14.4  INR  --   --  1.10  HEPARINUNFRC  --  0.34 0.43  CREATININE 7.63*  --  8.68*    Estimated Creatinine Clearance: 13 mL/min (by C-G formula based on Cr of 8.68).  Assessment: 62 YOM presenting with new onset A-flutter.  Pharmacy to dose heparin for a-flutter.  HL therapeutic at 0.42, Hgb 11.6, Plts 176. No s/sx bleeding noted.  Goal of Therapy:  Heparin level 0.3-0.7 units/ml Monitor platelets by anticoagulation protocol: Yes   Plan:  -Cont heparin 1400 units/hr - daily heparin level and CBC -Monitor for s/sx of bleeding.    Addendum: Pharmacy consulted to start Warfarin with heparin bridge until INR > 2.  Pt is also on Amiodarone gtt which may inc warfarin effect.   -Warfarin 2.5 mg x 1 at 1800 tonight.  -Cont heparin 1400 units/hr -Daily heparin level, CBC, INR  -Monitor for s/sx of bleeding.   Bennye Alm, PharmD Pharmacy Resident [phone] 06/28/2015,10:33 AM

## 2015-06-28 NOTE — Progress Notes (Signed)
ANTICOAGULATION CONSULT NOTE - Follow Up Consult  Pharmacy Consult for Heparin  Indication: A-flutter  Labs:  Recent Labs  06/27/15 1619 06/28/15 0010  HGB 11.8*  --   HCT 35.4*  --   PLT 175  --   HEPARINUNFRC  --  0.34  CREATININE 7.63*  --     Estimated Creatinine Clearance: 14.7 mL/min (by C-G formula based on Cr of 7.63).  Assessment: New onset A-flutter, initial heparin level is therapeutic   Goal of Therapy:  Heparin level 0.3-0.7 units/ml Monitor platelets by anticoagulation protocol: Yes   Plan:  -Continue heparin at 1400 units/hr -Confirmatory HL with AM labs  Narda Bonds 06/28/2015,12:53 AM

## 2015-06-28 NOTE — Progress Notes (Signed)
Subjective:  Patient denies any chest pain or shortness of breath converted back into sinus rhythm. Denies any palpitation states irregular heartbeat noted during hemodialysis yesterday  Objective:  Vital Signs in the last 24 hours: Temp:  [97.6 F (36.4 C)-98.1 F (36.7 C)] 97.6 F (36.4 C) (02/04 0527) Pulse Rate:  [79-129] 79 (02/04 0527) Resp:  [16-18] 16 (02/04 0527) BP: (101-122)/(75-95) 101/75 mmHg (02/04 0527) SpO2:  [100 %] 100 % (02/04 0527) Weight:  [109.907 kg (242 lb 4.8 oz)-111.041 kg (244 lb 12.8 oz)] 111.041 kg (244 lb 12.8 oz) (02/04 0527)  Intake/Output from previous day: 02/03 0701 - 02/04 0700 In: 412.9 [I.V.:412.9] Out: -  Intake/Output from this shift:    Physical Exam: Neck: no adenopathy, no carotid bruit, no JVD and supple, symmetrical, trachea midline Lungs: Decreased breath sound at bases Heart: regular rate and rhythm, S1, S2 normal and 2/6 systolic murmur noted Abdomen: soft, non-tender; bowel sounds normal; no masses,  no organomegaly Extremities: extremities normal, atraumatic, no cyanosis or edema  Lab Results:  Recent Labs  06/27/15 1619 06/28/15 0444  WBC 4.4 5.7  HGB 11.8* 11.6*  PLT 175 176    Recent Labs  06/27/15 1619 06/28/15 0444  NA 141 135  K 3.9 4.8  CL 98* 93*  CO2 30 28  GLUCOSE 113* 162*  BUN 21* 29*  CREATININE 7.63* 8.68*   No results for input(s): TROPONINI in the last 72 hours.  Invalid input(s): CK, MB Hepatic Function Panel  Recent Labs  06/27/15 1619  PROT 7.4  ALBUMIN 3.4*  AST 13*  ALT 12*  ALKPHOS 71  BILITOT 0.3   No results for input(s): CHOL in the last 72 hours. No results for input(s): PROTIME in the last 72 hours.  Imaging: Imaging results have been reviewed and Dg Chest Port 1 View  06/27/2015  CLINICAL DATA:  Dyspnea, tachycardia EXAM: PORTABLE CHEST 1 VIEW COMPARISON:  None. FINDINGS: Normal cardiac silhouette. Central venous congestion is present. No effusion, infiltrate or  pneumothorax. No acute osseous abnormality. IMPRESSION: Mild cardiomegaly.  Mild central venous congestion. Electronically Signed   By: Suzy Bouchard M.D.   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

## 2015-06-29 ENCOUNTER — Inpatient Hospital Stay (HOSPITAL_COMMUNITY): Payer: Medicare Other

## 2015-06-29 LAB — HEPARIN LEVEL (UNFRACTIONATED): Heparin Unfractionated: 0.53 IU/mL (ref 0.30–0.70)

## 2015-06-29 LAB — CBC
HCT: 35.2 % — ABNORMAL LOW (ref 39.0–52.0)
Hemoglobin: 11.5 g/dL — ABNORMAL LOW (ref 13.0–17.0)
MCH: 31.3 pg (ref 26.0–34.0)
MCHC: 32.7 g/dL (ref 30.0–36.0)
MCV: 95.7 fL (ref 78.0–100.0)
Platelets: 174 10*3/uL (ref 150–400)
RBC: 3.68 MIL/uL — ABNORMAL LOW (ref 4.22–5.81)
RDW: 16.7 % — ABNORMAL HIGH (ref 11.5–15.5)
WBC: 5 10*3/uL (ref 4.0–10.5)

## 2015-06-29 LAB — PROTIME-INR
INR: 1.17 (ref 0.00–1.49)
Prothrombin Time: 15.1 seconds (ref 11.6–15.2)

## 2015-06-29 MED ORDER — WARFARIN SODIUM 5 MG PO TABS
5.0000 mg | ORAL_TABLET | Freq: Once | ORAL | Status: AC
  Administered 2015-06-29: 5 mg via ORAL
  Filled 2015-06-29: qty 1

## 2015-06-29 MED ORDER — ISOSORB DINITRATE-HYDRALAZINE 20-37.5 MG PO TABS
0.5000 | ORAL_TABLET | Freq: Two times a day (BID) | ORAL | Status: DC
  Administered 2015-06-29 – 2015-07-05 (×12): 0.5 via ORAL
  Filled 2015-06-29 (×12): qty 1

## 2015-06-29 MED ORDER — GI COCKTAIL ~~LOC~~
30.0000 mL | Freq: Three times a day (TID) | ORAL | Status: DC | PRN
  Administered 2015-06-29 (×2): 30 mL via ORAL
  Filled 2015-06-29 (×2): qty 30

## 2015-06-29 MED ORDER — AMIODARONE HCL 200 MG PO TABS
200.0000 mg | ORAL_TABLET | Freq: Two times a day (BID) | ORAL | Status: DC
  Administered 2015-06-29 – 2015-07-05 (×12): 200 mg via ORAL
  Filled 2015-06-29 (×12): qty 1

## 2015-06-29 NOTE — Progress Notes (Signed)
Subjective:   patient denies any chest pain or shortness of breath.  Remains in sinus rhythm.  QTC prolonged amiodarone dose has been reduced.  No further abdominal pain.  No fever or chills.  Objective:  Vital Signs in the last 24 hours: Temp:  [97.4 F (36.3 C)-97.6 F (36.4 C)] 97.4 F (36.3 C) (02/05 0532) Pulse Rate:  [69-81] 69 (02/05 0532) Resp:  [16-20] 18 (02/05 0532) BP: (91-107)/(65-81) 91/65 mmHg (02/05 0532) SpO2:  [99 %-100 %] 100 % (02/05 0532) Weight:  [112.265 kg (247 lb 8 oz)] 112.265 kg (247 lb 8 oz) (02/05 0500)  Intake/Output from previous day: 02/04 0701 - 02/05 0700 In: 32 [P.O.:240; I.V.:350] Out: -  Intake/Output from this shift: Total I/O In: 240 [P.O.:240] Out: -   Physical Exam: Neck: no adenopathy, no carotid bruit, no JVD and supple, symmetrical, trachea midline Lungs: clear to auscultation bilaterally Heart: regular rate and rhythm, S1, S2 normal and 2/6 systolic murmur noted Abdomen: soft, non-tender; bowel sounds normal; no masses,  no organomegaly Extremities: extremities normal, atraumatic, no cyanosis or edema  Lab Results:  Recent Labs  06/28/15 2242 06/29/15 0350  WBC 5.5 5.0  HGB 12.3* 11.5*  PLT 181 174    Recent Labs  06/27/15 1619 06/28/15 0444  NA 141 135  K 3.9 4.8  CL 98* 93*  CO2 30 28  GLUCOSE 113* 162*  BUN 21* 29*  CREATININE 7.63* 8.68*   No results for input(s): TROPONINI in the last 72 hours.  Invalid input(s): CK, MB Hepatic Function Panel  Recent Labs  06/27/15 1619  PROT 7.4  ALBUMIN 3.4*  AST 13*  ALT 12*  ALKPHOS 71  BILITOT 0.3   No results for input(s): CHOL in the last 72 hours. No results for input(s): PROTIME in the last 72 hours.  Imaging: Imaging results have been reviewed and Dg Abd 1 View  06/29/2015  CLINICAL DATA:  Diverticulitis. EXAM: ABDOMEN - 1 VIEW COMPARISON:  No recent exams.  CT 04/21/2015 FINDINGS: There is a normal bowel gas pattern without dilated bowel loops.  Scattered colonic diverticular containing barium from prior contrast exam are seen throughout the colon. No evidence of free intra-abdominal air. Surgical clips in the right upper abdomen post right nephrectomy. IMPRESSION: Normal bowel gas pattern. Diverticulosis with diverticulum containing barium from prior exam. Electronically Signed   By: Jeb Levering M.D.   On: 06/29/2015 02:25   Dg Chest Port 1 View  06/27/2015  CLINICAL DATA:  Dyspnea, tachycardia EXAM: PORTABLE CHEST 1 VIEW COMPARISON:  None. FINDINGS: Normal cardiac silhouette. Central venous congestion is present. No effusion, infiltrate or pneumothorax. No acute osseous abnormality. IMPRESSION: Mild cardiomegaly.  Mild central venous congestion. Electronically Signed   By: Suzy Bouchard M.D.   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 May need repeat 2-D echo.  If EF remains below 35%.  May need an ICD/questionable flutter ablation in future.  Discussed briefly with the patient and agrees. Check labs in a.m. Reduce amiodarone as per orders.  In view of prolonged QTC. Check EKG in a.m.  LOS: 2 days    Charolette Forward 06/29/2015, 11:18 AM

## 2015-06-29 NOTE — Discharge Instructions (Addendum)
Information on my medicine - Coumadin®   (Warfarin) ° °This medication education was reviewed with me or my healthcare representative as part of my discharge preparation.  The pharmacist that spoke with me during my hospital stay was:  Kelsy E Combs, RPH ° °Why was Coumadin prescribed for you? °Coumadin was prescribed for you because you have a blood clot or a medical condition that can cause an increased risk of forming blood clots. Blood clots can cause serious health problems by blocking the flow of blood to the heart, lung, or brain. Coumadin can prevent harmful blood clots from forming. °As a reminder your indication for Coumadin is:   Stroke Prevention Because Of Atrial Fibrillation ° °What test will check on my response to Coumadin? °While on Coumadin (warfarin) you will need to have an INR test regularly to ensure that your dose is keeping you in the desired range. The INR (international normalized ratio) number is calculated from the result of the laboratory test called prothrombin time (PT). ° °If an INR APPOINTMENT HAS NOT ALREADY BEEN MADE FOR YOU please schedule an appointment to have this lab work done by your health care provider within 7 days. °Your INR goal is usually a number between:  2 to 3 or your provider may give you a more narrow range like 2-2.5.  Ask your health care provider during an office visit what your goal INR is. ° °What  do you need to  know  About  COUMADIN? °Take Coumadin (warfarin) exactly as prescribed by your healthcare provider about the same time each day.  DO NOT stop taking without talking to the doctor who prescribed the medication.  Stopping without other blood clot prevention medication to take the place of Coumadin may increase your risk of developing a new clot or stroke.  Get refills before you run out. ° °What do you do if you miss a dose? °If you miss a dose, take it as soon as you remember on the same day then continue your regularly scheduled regimen the next  day.  Do not take two doses of Coumadin at the same time. ° °Important Safety Information °A possible side effect of Coumadin (Warfarin) is an increased risk of bleeding. You should call your healthcare provider right away if you experience any of the following: °? Bleeding from an injury or your nose that does not stop. °? Unusual colored urine (red or dark brown) or unusual colored stools (red or black). °? Unusual bruising for unknown reasons. °? A serious fall or if you hit your head (even if there is no bleeding). ° °Some foods or medicines interact with Coumadin® (warfarin) and might alter your response to warfarin. To help avoid this: °? Eat a balanced diet, maintaining a consistent amount of Vitamin K. °? Notify your provider about major diet changes you plan to make. °? Avoid alcohol or limit your intake to 1 drink for women and 2 drinks for men per day. °(1 drink is 5 oz. wine, 12 oz. beer, or 1.5 oz. liquor.) ° °Make sure that ANY health care provider who prescribes medication for you knows that you are taking Coumadin (warfarin).  Also make sure the healthcare provider who is monitoring your Coumadin knows when you have started a new medication including herbals and non-prescription products. ° °Coumadin® (Warfarin)  Major Drug Interactions  °Increased Warfarin Effect Decreased Warfarin Effect  °Alcohol (large quantities) °Antibiotics (esp. Septra/Bactrim, Flagyl, Cipro) °Amiodarone (Cordarone) °Aspirin (ASA) °Cimetidine (Tagamet) °Megestrol (Megace) °NSAIDs (ibuprofen,   naproxen, etc.) Piroxicam (Feldene) Propafenone (Rythmol SR) Propranolol (Inderal) Isoniazid (INH) Posaconazole (Noxafil) Barbiturates (Phenobarbital) Carbamazepine (Tegretol) Chlordiazepoxide (Librium) Cholestyramine (Questran) Griseofulvin Oral Contraceptives Rifampin Sucralfate (Carafate) Vitamin K   Coumadin (Warfarin) Major Herbal Interactions  Increased Warfarin Effect Decreased Warfarin Effect   Garlic Ginseng Ginkgo biloba Coenzyme Q10 Green tea St. Johns wort    Coumadin (Warfarin) FOOD Interactions  Eat a consistent number of servings per week of foods HIGH in Vitamin K (1 serving =  cup)  Collards (cooked, or boiled & drained) Kale (cooked, or boiled & drained) Mustard greens (cooked, or boiled & drained) Parsley *serving size only =  cup Spinach (cooked, or boiled & drained) Swiss chard (cooked, or boiled & drained) Turnip greens (cooked, or boiled & drained)  Eat a consistent number of servings per week of foods MEDIUM-HIGH in Vitamin K (1 serving = 1 cup)  Asparagus (cooked, or boiled & drained) Broccoli (cooked, boiled & drained, or raw & chopped) Brussel sprouts (cooked, or boiled & drained) *serving size only =  cup Lettuce, raw (green leaf, endive, romaine) Spinach, raw Turnip greens, raw & chopped   These websites have more information on Coumadin (warfarin):  FailFactory.se; VeganReport.com.au;   Atrial Flutter Atrial flutter is a heart rhythm that can cause the heart to beat very fast (tachycardia). It originates in the upper chambers of the heart (atria). In atrial flutter, the top chambers of the heart (atria) often beat much faster than the bottom chambers of the heart (ventricles). Atrial flutter has a regular "saw toothed" appearance in an EKG readout. An EKG is a test that records the electrical activity of the heart. Atrial flutter can cause the heart to beat up to 150 beats per minute (BPM). Atrial flutter can either be short lived (paroxysmal) or permanent.  CAUSES  Causes of atrial flutter can be many. Some of these include:  Heart related issues:  Heart attack (myocardial infarction).  Heart failure.  Heart valve problems.  Poorly controlled high blood pressure (hypertension).  After open heart surgery.  Lung related issues:  A blood clot in the lungs (pulmonary embolism).  Chronic obstructive pulmonary  disease (COPD). Medications used to treat COPD can attribute to atrial flutter.  Other related causes:  Hyperthyroidism.  Caffeine.  Some decongestant cold medications.  Low electrolyte levels such as potassium or magnesium.  Cocaine. SYMPTOMS  An awareness of your heart beating rapidly (palpitations).  Shortness of breath.  Chest pain.  Low blood pressure (hypotension).  Dizziness or fainting. DIAGNOSIS  Different tests can be performed to diagnose atrial flutter.   An EKG.  Holter monitor. This is a 24-hour recording of your heart rhythm. You will also be given a diary. Write down all symptoms that you have and what you were doing at the time you experienced symptoms.  Cardiac event monitor. This small device can be worn for up to 30 days. When you have heart symptoms, you will push a button on the device. This will then record your heart rhythm.  Echocardiogram. This is an imaging test to look at your heart. Your caregiver will look at your heart valves and the ventricles.  Stress test. This test can help determine if the atrial flutter is related to exercise or if coronary artery disease is present.  Laboratory studies will look at certain blood levels like:  Complete blood count (CBC).  Potassium.  Magnesium.  Thyroid function. TREATMENT  Treatment of atrial flutter varies. A combination of therapies may be used or sometimes  atrial flutter may need only 1 type of treatment.  Lab work: If your blood work, such as your electrolytes (potassium, magnesium) or your thyroid function tests, are abnormal, your caregiver will treat them accordingly.  Medication:  There are several different types of medications that can convert your heart to a normal rhythm and prevent atrial flutter from reoccurring.  Nonsurgical procedures: Nonsurgical techniques may be used to control atrial flutter. Some examples include:  Cardioversion. This technique uses either drugs or an  electrical shock to restore a normal heart rhythm:  Cardioversion drugs may be given through an intravenous (IV) line to help "reset" the heart rhythm.  In electrical cardioversion, your caregiver shocks your heart with electrical energy. This helps to reset the heartbeat to a normal rhythm.  Ablation. If atrial flutter is a persistent problem, an ablation may be needed. This procedure is done under mild sedation. High frequency radio-wave energy is used to destroy the area of heart tissue responsible for atrial flutter. SEEK IMMEDIATE MEDICAL CARE IF:  You have:  Dizziness.  Near fainting or fainting.  Shortness of breath.  Chest pain or pressure.  Sudden nausea or vomiting.  Profuse sweating. If you have the above symptoms, call your local emergency service immediately! Do not drive yourself to the hospital. MAKE SURE YOU:   Understand these instructions.  Will watch your condition.  Will get help right away if you are not doing well or get worse.   This information is not intended to replace advice given to you by your health care provider. Make sure you discuss any questions you have with your health care provider.   Document Released: 09/26/2008 Document Revised: 05/31/2014 Document Reviewed: 11/22/2014 Elsevier Interactive Patient Education [employer].

## 2015-06-29 NOTE — Progress Notes (Signed)
ANTICOAGULATION CONSULT NOTE - Follow Up Consult  Pharmacy Consult for Heparin --> Warfarin  Indication: A-flutter / Afib  Labs:  Recent Labs  06/27/15 1619 06/28/15 0010 06/28/15 0444 06/28/15 2242 06/29/15 0350 06/29/15 0439  HGB 11.8*  --  11.6* 12.3* 11.5*  --   HCT 35.4*  --  35.7* 38.2* 35.2*  --   PLT 175  --  176 181 174  --   LABPROT  --   --  14.4  --   --  15.1  INR  --   --  1.10  --   --  1.17  HEPARINUNFRC  --  0.34 0.43  --   --  0.53  CREATININE 7.63*  --  8.68*  --   --   --     Estimated Creatinine Clearance: 13.1 mL/min (by C-G formula based on Cr of 8.68).  Assessment: 34 YOM presenting with new onset A-flutter.  Pharmacy to dose heparin start Warfarin with heparin bridge until INR > 2.  HL therapeutic at 0.53, Hgb 11.5, Plts 174. No s/sx bleeding noted.  INR subtherapeutic at 1.17.   Note: DDI with amiodarone (may inc warfarin effect)  Goal of Therapy:  Heparin level 0.3-0.7 units/ml Monitor platelets by anticoagulation protocol: Yes   Plan:  -Warfarin 5 mg x 1 at 1800 tonight.  -Cont heparin 1400 units/hr -Daily heparin level, CBC, INR  -Monitor for s/sx of bleeding.   Bennye Alm, PharmD Pharmacy Resident [phone] 06/29/2015,7:49 AM

## 2015-06-29 NOTE — Progress Notes (Signed)
  Echocardiogram 2D Echocardiogram has been performed.  [name] 06/29/2015, 11:23 AM

## 2015-06-30 LAB — CBC
HCT: 35 % — ABNORMAL LOW (ref 39.0–52.0)
HCT: 33.8 % — ABNORMAL LOW (ref 39.0–52.0)
Hemoglobin: 11.6 g/dL — ABNORMAL LOW (ref 13.0–17.0)
Hemoglobin: 11.2 g/dL — ABNORMAL LOW (ref 13.0–17.0)
MCH: 31.5 pg (ref 26.0–34.0)
MCH: 31.5 pg (ref 26.0–34.0)
MCHC: 33.1 g/dL (ref 30.0–36.0)
MCHC: 33.1 g/dL (ref 30.0–36.0)
MCV: 95.1 fL (ref 78.0–100.0)
MCV: 94.9 fL (ref 78.0–100.0)
Platelets: 180 10*3/uL (ref 150–400)
Platelets: 184 10*3/uL (ref 150–400)
RBC: 3.68 MIL/uL — ABNORMAL LOW (ref 4.22–5.81)
RBC: 3.56 MIL/uL — ABNORMAL LOW (ref 4.22–5.81)
RDW: 16.5 % — ABNORMAL HIGH (ref 11.5–15.5)
RDW: 16.6 % — ABNORMAL HIGH (ref 11.5–15.5)
WBC: 5.2 10*3/uL (ref 4.0–10.5)
WBC: 4.8 10*3/uL (ref 4.0–10.5)

## 2015-06-30 LAB — RENAL FUNCTION PANEL
Albumin: 3.3 g/dL — ABNORMAL LOW (ref 3.5–5.0)
Anion gap: 16 — ABNORMAL HIGH (ref 5–15)
BUN: 52 mg/dL — ABNORMAL HIGH (ref 6–20)
CO2: 25 mmol/L (ref 22–32)
Calcium: 8.9 mg/dL (ref 8.9–10.3)
Chloride: 92 mmol/L — ABNORMAL LOW (ref 101–111)
Creatinine, Ser: 12.94 mg/dL — ABNORMAL HIGH (ref 0.61–1.24)
GFR calc Af Amer: 4 mL/min — ABNORMAL LOW (ref >60)
GFR calc non Af Amer: 4 mL/min — ABNORMAL LOW (ref >60)
Glucose, Bld: 99 mg/dL (ref 65–99)
Phosphorus: 6.2 mg/dL — ABNORMAL HIGH (ref 2.5–4.6)
Potassium: 5.6 mmol/L — ABNORMAL HIGH (ref 3.5–5.1)
Sodium: 133 mmol/L — ABNORMAL LOW (ref 135–145)

## 2015-06-30 LAB — PROTIME-INR
INR: 1.15 (ref 0.00–1.49)
Prothrombin Time: 14.9 seconds (ref 11.6–15.2)

## 2015-06-30 LAB — HEPARIN LEVEL (UNFRACTIONATED): Heparin Unfractionated: 0.56 IU/mL (ref 0.30–0.70)

## 2015-06-30 MED ORDER — SODIUM CHLORIDE 0.9 % IV SOLN: 100.0000 mL | INTRAVENOUS | Status: DC | PRN

## 2015-06-30 MED ORDER — PENTAFLUOROPROP-TETRAFLUOROETH EX AERO: 1.0000 "application " | INHALATION_SPRAY | CUTANEOUS | Status: DC | PRN

## 2015-06-30 MED ORDER — LIDOCAINE HCL (PF) 1 % IJ SOLN: 5.0000 mL | INTRAMUSCULAR | Status: DC | PRN

## 2015-06-30 MED ORDER — WARFARIN SODIUM 7.5 MG PO TABS
7.5000 mg | ORAL_TABLET | Freq: Once | ORAL | Status: AC
Start: 2015-06-30 — End: 2015-06-30
  Administered 2015-06-30: 7.5 mg via ORAL
  Filled 2015-06-30: qty 1

## 2015-06-30 MED ORDER — LIDOCAINE-PRILOCAINE 2.5-2.5 % EX CREA: 1.0000 "application " | TOPICAL_CREAM | CUTANEOUS | Status: DC | PRN

## 2015-06-30 NOTE — Progress Notes (Signed)
[facility] Progress Note  Assessment/Plan: 1. AFib/Aflutter with RVR: has converted to SR. Started on heparin/bridging to coumadin. Per cardiology. Oral Amiodarone. 2. ESRD - MWF at [facility]. On HD today. K+5.6. 2.0 K bath.   3. Hypertension/volume -  On amlodipine and carvedilol. Mild venous congestion on CXR. Has been lowering EDW by 0.5 kg incenter however EDW has not been adjusted. Attempted to challenge on HD today. SBP fell to 88. Decreased UFG to 3500. BP now stable.     4. Anemia -Hgb 11.2, rec'd ESA dose this week. Follow HGB 5. Metabolic bone disease - continue binders, sensipar.  Has had issues with hypercalcemia in past-monitor calcium. Adm C Ca 10.8  Changed to non-calcium binders, DC'd oral Vit D.Ca+ down 8.9 C Ca 9.46. Phos 6.2 today.   6. Nutrition - Albumin 3.3  renal diet, renal vit  Rita H. Brown NP-C 06/30/2015, 11:01 AM  [facility] [phone]  Pt seen, examined and agree w A/P as above.  Kelly Splinter MD [facility] pager [phone]    cell [phone] 06/30/2015, 2:10 PM    Subjective: "I'm doing OK". Denies SOB/Chest pain. SR on monitor.      Objective Filed Vitals:   06/30/15 0930 06/30/15 0940 06/30/15 1000 06/30/15 1030  BP: 90/59 87/69 93/58  94/70  Pulse: 65 64 67 70  Temp:      TempSrc:      Resp: 18 17 20 19   Height:      Weight:      SpO2:       Physical Exam General: well nourished, NAD Heart: RRR with S1 S2. II/VI systolic M.  Lungs: bilateral breath sounds CTA Abdomen: soft, nontender Extremities: No LE edema Dialysis Access: LUA AVF + thrill + bruit (recent intervention at [facility]) Has small denuded area on lower portion of AVF, no drainage. Monitor.  Dialysis Orders: Center: [facility] on MWF . EDW 110 HD Bath 2.0 K 2.25 Ca Time 4 hours Heparin 12000 units at start of tx, 4000 units mid run q treatment. Access :LUA AVF BFR 450 DFR 800  Mircera 50 mcg IV q 2 weeks ( last dose  06/25/15)  Additional Objective Labs: Basic Metabolic Panel:  Recent Labs Lab 06/27/15 1619 06/28/15 0444 06/30/15 0821  NA 141 135 133*  K 3.9 4.8 5.6*  CL 98* 93* 92*  CO2 30 28 25   GLUCOSE 113* 162* 99  BUN 21* 29* 52*  CREATININE 7.63* 8.68* 12.94*  CALCIUM 9.8 10.3 8.9  PHOS  --   --  6.2*   Liver Function Tests:  Recent Labs Lab 06/27/15 1619 06/30/15 0821  AST 13*  --   ALT 12*  --   ALKPHOS 71  --   BILITOT 0.3  --   PROT 7.4  --   ALBUMIN 3.4* 3.3*    Recent Labs Lab 06/28/15 2242  LIPASE 25   CBC:  Recent Labs Lab 06/27/15 1619 06/28/15 0444 06/28/15 2242 06/29/15 0350 06/30/15 0556 06/30/15 0822  WBC 4.4 5.7 5.5 5.0 5.2 4.8  NEUTROABS 2.5  --   --   --   --   --   HGB 11.8* 11.6* 12.3* 11.5* 11.6* 11.2*  HCT 35.4* 35.7* 38.2* 35.2* 35.0* 33.8*  MCV 93.9 95.7 96.0 95.7 95.1 94.9  PLT 175 176 181 174 180 184   Blood Culture    Component Value Date/Time   SDES URINE, RANDOM 01/18/2011 0758   SPECREQUEST NONE 01/18/2011 0758   CULT NO  GROWTH 01/18/2011 0758   REPTSTATUS 01/19/2011 FINAL 01/18/2011 0758    Cardiac Enzymes: No results for input(s): CKTOTAL, CKMB, CKMBINDEX, TROPONINI in the last 168 hours. CBG: No results for input(s): GLUCAP in the last 168 hours. Iron Studies: No results for input(s): IRON, TIBC, TRANSFERRIN, FERRITIN in the last 72 hours. @lablastinr3 @ Studies/Results: Dg Abd 1 View  06/29/2015  CLINICAL DATA:  Diverticulitis. EXAM: ABDOMEN - 1 VIEW COMPARISON:  No recent exams.  CT 04/21/2015 FINDINGS: There is a normal bowel gas pattern without dilated bowel loops. Scattered colonic diverticular containing barium from prior contrast exam are seen throughout the colon. No evidence of free intra-abdominal air. Surgical clips in the right upper abdomen post right nephrectomy. IMPRESSION: Normal bowel gas pattern. Diverticulosis with diverticulum containing barium from prior exam. Electronically Signed   By: Jeb Levering M.D.   On: 06/29/2015 02:25   Medications: . heparin 1,400 Units/hr (06/30/15 1013)   . amiodarone  200 mg Oral BID  . aspirin  325 mg Oral Daily  . carvedilol  12.5 mg Oral Q M,W,F-2000   And  . carvedilol  12.5 mg Oral Q T,Th,S,Su   And  . carvedilol  12.5 mg Oral Q T,Th,S,Su  . cinacalcet  90 mg Oral QPM  . isosorbide-hydrALAZINE  0.5 tablet Oral BID  . lidocaine-prilocaine  1 application Topical Q M,W,F  . multivitamin  1 tablet Oral QHS  . off the beat book   Does not apply Once  . pantoprazole  40 mg Oral Q1200  . sevelamer carbonate  1,600 mg Oral With snacks  . sevelamer carbonate  2,400 mg Oral TID WC  . sodium chloride flush  3 mL Intravenous Q12H  . warfarin  7.5 mg Oral ONCE-1800  . Warfarin - Pharmacist Dosing Inpatient   Does not apply [phone]

## 2015-06-30 NOTE — Progress Notes (Signed)
ANTICOAGULATION CONSULT NOTE - Follow Up Consult  Pharmacy Consult for Heparin --> Warfarin  Indication: A-flutter / Afib  Labs:  Recent Labs  06/27/15 1619  06/28/15 0444 06/28/15 2242 06/29/15 0350 06/29/15 0439 06/30/15 0556  HGB 11.8*  --  11.6* 12.3* 11.5*  --  11.6*  HCT 35.4*  --  35.7* 38.2* 35.2*  --  35.0*  PLT 175  --  176 181 174  --  180  LABPROT  --   --  14.4  --   --  15.1 14.9  INR  --   --  1.10  --   --  1.17 1.15  HEPARINUNFRC  --   < > 0.43  --   --  0.53 0.56  CREATININE 7.63*  --  8.68*  --   --   --   --   < > = values in this interval not displayed.  Estimated Creatinine Clearance: 13.1 mL/min (by C-G formula based on Cr of 8.68).  Assessment: 30 YOM presenting with new onset A-flutter.  Pharmacy to dose heparin start Warfarin with heparin bridge until INR > 2.  HL remains therapeutic at 0.56 on heparin 1400 units/hr and INR still subtherapeutic at 1.15.   Note: DDI with amiodarone (may inc warfarin effect)  Goal of Therapy:  Heparin level 0.3-0.7 units/ml Monitor platelets by anticoagulation protocol: Yes   Plan:  -Warfarin 7.5mg  tonight x1  -Cont heparin 1400 units/hr -Daily heparin level, CBC, INR  -Monitor for s/sx of bleeding.   Andrey Cota. Diona Foley, PharmD, BCPS Clinical Pharmacist Pager [phone] 06/30/2015,8:24 AM

## 2015-06-30 NOTE — Progress Notes (Signed)
Ref: Maggie Font, MD   Subjective:  Stable vital signs. No chest pain. Heart rate 60's with sinus rhythm. INR-1.15  Objective:  Vital Signs in the last 24 hours: Temp:  [97.6 F (36.4 C)-98 F (36.7 C)] 98 F (36.7 C) (02/06 1200) Pulse Rate:  [64-70] 68 (02/06 1200) Cardiac Rhythm:  [-] Normal sinus rhythm (02/06 1222) Resp:  [17-20] 20 (02/06 1200) BP: (87-104)/(58-78) 95/71 mmHg (02/06 1200) SpO2:  [98 %-100 %] 100 % (02/06 1200) Weight:  [110 kg (242 lb 8.1 oz)-113.4 kg (250 lb)] 110 kg (242 lb 8.1 oz) (02/06 1200)  Physical Exam: BP Readings from Last 1 Encounters:  06/30/15 95/71    Wt Readings from Last 1 Encounters:  06/30/15 110 kg (242 lb 8.1 oz)    Weight change: 1.089 kg (2 lb 6.4 oz)  HEENT: [place]/AT, Eyes-Brown, PERL, EOMI, Conjunctiva-Pale pink, Sclera-Non-icteric Neck: No JVD, No bruit, Trachea midline. Lungs:  Clear, Bilateral. Cardiac:  Regular rhythm, normal S1 and S2, no S3. II/VI systolic murmur. Abdomen:  Soft, non-tender. Extremities:  No edema present. No cyanosis. No clubbing. CNS: AxOx3, Cranial nerves grossly intact, moves all 4 extremities. Right handed. Skin: Warm and dry.   Intake/Output from previous day: 02/05 0701 - 02/06 0700 In: 861.9 [P.O.:720; I.V.:141.9] Out: -     Lab Results: BMET    Component Value Date/Time   NA 133* 06/30/2015 0821   NA 135 06/28/2015 0444   NA 141 06/27/2015 1619   K 5.6* 06/30/2015 0821   K 4.8 06/28/2015 0444   K 3.9 06/27/2015 1619   CL 92* 06/30/2015 0821   CL 93* 06/28/2015 0444   CL 98* 06/27/2015 1619   CO2 25 06/30/2015 0821   CO2 28 06/28/2015 0444   CO2 30 06/27/2015 1619   GLUCOSE 99 06/30/2015 0821   GLUCOSE 162* 06/28/2015 0444   GLUCOSE 113* 06/27/2015 1619   BUN 52* 06/30/2015 0821   BUN 29* 06/28/2015 0444   BUN 21* 06/27/2015 1619   CREATININE 12.94* 06/30/2015 0821   CREATININE 8.68* 06/28/2015 0444   CREATININE 7.63* 06/27/2015 1619   CALCIUM 8.9 06/30/2015 0821   CALCIUM  10.3 06/28/2015 0444   CALCIUM 9.8 06/27/2015 1619   GFRNONAA 4* 06/30/2015 0821   GFRNONAA 6* 06/28/2015 0444   GFRNONAA 7* 06/27/2015 1619   GFRAA 4* 06/30/2015 0821   GFRAA 7* 06/28/2015 0444   GFRAA 8* 06/27/2015 1619   CBC    Component Value Date/Time   WBC 4.8 06/30/2015 0822   RBC 3.56* 06/30/2015 0822   HGB 11.2* 06/30/2015 0822   HCT 33.8* 06/30/2015 0822   PLT 184 06/30/2015 0822   MCV 94.9 06/30/2015 0822   MCH 31.5 06/30/2015 0822   MCHC 33.1 06/30/2015 0822   RDW 16.6* 06/30/2015 0822   LYMPHSABS 1.3 06/27/2015 1619   MONOABS 0.4 06/27/2015 1619   EOSABS 0.2 06/27/2015 1619   BASOSABS 0.0 06/27/2015 1619   HEPATIC Function Panel  Recent Labs  04/21/15 1150 04/22/15 0537 06/27/15 1619  PROT 8.7* 7.9 7.4   HEMOGLOBIN A1C No components found for: HGA1C,  MPG CARDIAC ENZYMES Lab Results  Component Value Date   CKTOTAL 113 05/20/2011   CKMB 4.3* 05/20/2011   TROPONINI 0.40* 01/14/2015   TROPONINI 0.37* 01/14/2015   TROPONINI 0.41* 01/13/2015   BNP No results for input(s): PROBNP in the last 8760 hours. TSH  Recent Labs  06/27/15 1619  TSH 0.768   CHOLESTEROL No results for input(s): CHOL in the  last 8760 hours.  Scheduled Meds: . amiodarone  200 mg Oral BID  . aspirin  325 mg Oral Daily  . carvedilol  12.5 mg Oral Q M,W,F-2000   And  . carvedilol  12.5 mg Oral Q T,Th,S,Su   And  . carvedilol  12.5 mg Oral Q T,Th,S,Su  . cinacalcet  90 mg Oral QPM  . isosorbide-hydrALAZINE  0.5 tablet Oral BID  . lidocaine-prilocaine  1 application Topical Q M,W,F  . multivitamin  1 tablet Oral QHS  . off the beat book   Does not apply Once  . pantoprazole  40 mg Oral Q1200  . sevelamer carbonate  1,600 mg Oral With snacks  . sevelamer carbonate  2,400 mg Oral TID WC  . sodium chloride flush  3 mL Intravenous Q12H  . warfarin  7.5 mg Oral ONCE-1800  . Warfarin - Pharmacist Dosing Inpatient   Does not apply q1800   Continuous Infusions: . heparin  1,400 Units/hr (06/30/15 1013)   PRN Meds:.sodium chloride, gi cocktail, nitroGLYCERIN, ondansetron (ZOFRAN) IV, oxyCODONE-acetaminophen, simethicone, sodium chloride flush  Assessment/Plan: Atrial flutter with [id number] score of 3/9 (now) sinus rhythm Chronic left heart failure  ESRD with hemodialysis, MWF.  DM, II  Gout  Obesity CAD H/O Renal cell cancer S/P right nephrectomy Moderate MR and TR   Continue medical treatment.   LOS: 3 days    Dixie Dials  MD  06/30/2015, 4:24 PM

## 2015-07-01 LAB — CBC
HCT: 34.3 % — ABNORMAL LOW (ref 39.0–52.0)
Hemoglobin: 11.2 g/dL — ABNORMAL LOW (ref 13.0–17.0)
MCH: 31.5 pg (ref 26.0–34.0)
MCHC: 32.7 g/dL (ref 30.0–36.0)
MCV: 96.3 fL (ref 78.0–100.0)
Platelets: 161 10*3/uL (ref 150–400)
RBC: 3.56 MIL/uL — ABNORMAL LOW (ref 4.22–5.81)
RDW: 16.8 % — ABNORMAL HIGH (ref 11.5–15.5)
WBC: 3.7 10*3/uL — ABNORMAL LOW (ref 4.0–10.5)

## 2015-07-01 LAB — PROTIME-INR
INR: 1.3 (ref 0.00–1.49)
Prothrombin Time: 16.3 seconds — ABNORMAL HIGH (ref 11.6–15.2)

## 2015-07-01 LAB — HEPARIN LEVEL (UNFRACTIONATED): Heparin Unfractionated: 0.34 IU/mL (ref 0.30–0.70)

## 2015-07-01 MED ORDER — WARFARIN SODIUM 7.5 MG PO TABS
7.5000 mg | ORAL_TABLET | Freq: Once | ORAL | Status: AC
  Administered 2015-07-01: 7.5 mg via ORAL
  Filled 2015-07-01: qty 1

## 2015-07-01 NOTE — Progress Notes (Signed)
Ref: Maggie Font, MD   Subjective:  Normal vital signs with sinus rhythm. INR-1.30  Objective:  Vital Signs in the last 24 hours: Temp:  [97.6 F (36.4 C)-98.4 F (36.9 C)] 97.9 F (36.6 C) (02/07 1300) Pulse Rate:  [63-70] 63 (02/07 1300) Cardiac Rhythm:  [-] Normal sinus rhythm (02/07 1715) Resp:  [18-20] 18 (02/07 1300) BP: (92-104)/(63-75) 104/75 mmHg (02/07 1717) SpO2:  [98 %-100 %] 100 % (02/07 1717) Weight:  [110.904 kg (244 lb 8 oz)] 110.904 kg (244 lb 8 oz) (02/07 0537)  Physical Exam: BP Readings from Last 1 Encounters:  07/01/15 104/75    Wt Readings from Last 1 Encounters:  07/01/15 110.904 kg (244 lb 8 oz)    Weight change: 0.046 kg (1.6 oz)  HEENT: [place]/AT, Eyes-Brown, PERL, EOMI, Conjunctiva-Pale pink, Sclera-Non-icteric Neck: No JVD, No bruit, Trachea midline. Lungs:  Clear, Bilateral. Cardiac:  Regular rhythm, normal S1 and S2, no S3. II/VI systolic murmur. Abdomen:  Soft, non-tender. Extremities:  No edema present. No cyanosis. No clubbing. LUE- AVF CNS: AxOx3, Cranial nerves grossly intact, moves all 4 extremities. Right handed. Skin: Warm and dry.   Intake/Output from previous day: 02/06 0701 - 02/07 0700 In: 992.2 [P.O.:720; I.V.:272.2] Out: 3500     Lab Results: BMET    Component Value Date/Time   NA 133* 06/30/2015 0821   NA 135 06/28/2015 0444   NA 141 06/27/2015 1619   K 5.6* 06/30/2015 0821   K 4.8 06/28/2015 0444   K 3.9 06/27/2015 1619   CL 92* 06/30/2015 0821   CL 93* 06/28/2015 0444   CL 98* 06/27/2015 1619   CO2 25 06/30/2015 0821   CO2 28 06/28/2015 0444   CO2 30 06/27/2015 1619   GLUCOSE 99 06/30/2015 0821   GLUCOSE 162* 06/28/2015 0444   GLUCOSE 113* 06/27/2015 1619   BUN 52* 06/30/2015 0821   BUN 29* 06/28/2015 0444   BUN 21* 06/27/2015 1619   CREATININE 12.94* 06/30/2015 0821   CREATININE 8.68* 06/28/2015 0444   CREATININE 7.63* 06/27/2015 1619   CALCIUM 8.9 06/30/2015 0821   CALCIUM 10.3 06/28/2015 0444   CALCIUM 9.8 06/27/2015 1619   GFRNONAA 4* 06/30/2015 0821   GFRNONAA 6* 06/28/2015 0444   GFRNONAA 7* 06/27/2015 1619   GFRAA 4* 06/30/2015 0821   GFRAA 7* 06/28/2015 0444   GFRAA 8* 06/27/2015 1619   CBC    Component Value Date/Time   WBC 3.7* 07/01/2015 0704   RBC 3.56* 07/01/2015 0704   HGB 11.2* 07/01/2015 0704   HCT 34.3* 07/01/2015 0704   PLT 161 07/01/2015 0704   MCV 96.3 07/01/2015 0704   MCH 31.5 07/01/2015 0704   MCHC 32.7 07/01/2015 0704   RDW 16.8* 07/01/2015 0704   LYMPHSABS 1.3 06/27/2015 1619   MONOABS 0.4 06/27/2015 1619   EOSABS 0.2 06/27/2015 1619   BASOSABS 0.0 06/27/2015 1619   HEPATIC Function Panel  Recent Labs  04/21/15 1150 04/22/15 0537 06/27/15 1619  PROT 8.7* 7.9 7.4   HEMOGLOBIN A1C No components found for: HGA1C,  MPG CARDIAC ENZYMES Lab Results  Component Value Date   CKTOTAL 113 05/20/2011   CKMB 4.3* 05/20/2011   TROPONINI 0.40* 01/14/2015   TROPONINI 0.37* 01/14/2015   TROPONINI 0.41* 01/13/2015   BNP No results for input(s): PROBNP in the last 8760 hours. TSH  Recent Labs  06/27/15 1619  TSH 0.768   CHOLESTEROL No results for input(s): CHOL in the last 8760 hours.  Scheduled Meds: . amiodarone  200  mg Oral BID  . aspirin  325 mg Oral Daily  . carvedilol  12.5 mg Oral Q M,W,F-2000   And  . carvedilol  12.5 mg Oral Q T,Th,S,Su   And  . carvedilol  12.5 mg Oral Q T,Th,S,Su  . cinacalcet  90 mg Oral QPM  . isosorbide-hydrALAZINE  0.5 tablet Oral BID  . lidocaine-prilocaine  1 application Topical Q M,W,F  . multivitamin  1 tablet Oral QHS  . off the beat book   Does not apply Once  . pantoprazole  40 mg Oral Q1200  . sevelamer carbonate  1,600 mg Oral With snacks  . sevelamer carbonate  2,400 mg Oral TID WC  . sodium chloride flush  3 mL Intravenous Q12H  . Warfarin - Pharmacist Dosing Inpatient   Does not apply q1800   Continuous Infusions: . heparin 1,400 Units/hr (07/01/15 0525)   PRN Meds:.sodium chloride,  gi cocktail, nitroGLYCERIN, ondansetron (ZOFRAN) IV, oxyCODONE-acetaminophen, simethicone, sodium chloride flush  Assessment/Plan: Atrial flutter with RVR-CHA2DS2VASc score of 3/9 (now) sinus rhythm Chronic left heart failure  ESRD with hemodialysis, MWF.  DM, II  Gout  Obesity CAD H/O Renal cell cancer S/P right nephrectomy Moderate MR and TR  Continue medical treatment.  Increase activity.   LOS: 4 days    Dixie Dials  MD  07/01/2015, 7:01 PM

## 2015-07-01 NOTE — Progress Notes (Signed)
[facility] Progress Note  Assessment/Plan: 1. AFib/Aflutter with RVR: converted to SR. Started on heparin/bridging to coumadin. On amio 2. ESRD - MWF HD. HD tomorrow.  3. Hypertension/volume -  Amlod/ coreg. At dry wt.     4. Anemia -Hgb 11.2, rec'd ESA dose this week. Follow HGB 5. Metabolic bone disease - continue binders, sensipar.  Has had issues with hypercalcemia in past-monitor calcium. Adm C Ca 10.8  Changed to non-calcium binders, DC'd oral Vit D.  6. Nutrition - Albumin 3.3  renal diet, renal vit   Kelly Splinter MD [facility] pager [phone]    cell [phone] 07/01/2015, 5:22 PM    Subjective: "I'm doing OK". Denies SOB/Chest pain. SR on monitor.      Objective Filed Vitals:   07/01/15 0537 07/01/15 0829 07/01/15 1300 07/01/15 1717  BP:  103/66 95/63 104/75  Pulse:  69 63   Temp:   97.9 F (36.6 C)   TempSrc:   Oral   Resp:   18   Height:      Weight: 110.904 kg (244 lb 8 oz)     SpO2:   100% 100%   Physical Exam General: well nourished, NAD Heart: RRR with S1 S2. II/VI systolic M.  Lungs: bilateral breath sounds CTA Abdomen: soft, nontender Extremities: No LE edema Dialysis Access: LUA AVF + thrill + bruit (recent intervention at [facility]) Has small denuded area on lower portion of AVF, no drainage. Monitor.  Dialysis Orders: Center: [facility] on MWF . EDW 110 HD Bath 2.0 K 2.25 Ca Time 4 hours Heparin 12000 units at start of tx, 4000 units mid run q treatment. Access :LUA AVF BFR 450 DFR 800  Mircera 50 mcg IV q 2 weeks ( last dose 06/25/15)  Additional Objective Labs: Basic Metabolic Panel:  Recent Labs Lab 06/27/15 1619 06/28/15 0444 06/30/15 0821  NA 141 135 133*  K 3.9 4.8 5.6*  CL 98* 93* 92*  CO2 30 28 25   GLUCOSE 113* 162* 99  BUN 21* 29* 52*  CREATININE 7.63* 8.68* 12.94*  CALCIUM 9.8 10.3 8.9  PHOS  --   --  6.2*   Liver Function Tests:  Recent Labs Lab 06/27/15 1619 06/30/15 0821  AST 13*   --   ALT 12*  --   ALKPHOS 71  --   BILITOT 0.3  --   PROT 7.4  --   ALBUMIN 3.4* 3.3*    Recent Labs Lab 06/28/15 2242  LIPASE 25   CBC:  Recent Labs Lab 06/27/15 1619  06/28/15 2242 06/29/15 0350 06/30/15 0556 06/30/15 0822 07/01/15 0704  WBC 4.4  < > 5.5 5.0 5.2 4.8 3.7*  NEUTROABS 2.5  --   --   --   --   --   --   HGB 11.8*  < > 12.3* 11.5* 11.6* 11.2* 11.2*  HCT 35.4*  < > 38.2* 35.2* 35.0* 33.8* 34.3*  MCV 93.9  < > 96.0 95.7 95.1 94.9 96.3  PLT 175  < > 181 174 180 184 161  < > = values in this interval not displayed. Blood Culture    Component Value Date/Time   SDES URINE, RANDOM 01/18/2011 0758   SPECREQUEST NONE 01/18/2011 0758   CULT NO GROWTH 01/18/2011 0758   REPTSTATUS 01/19/2011 FINAL 01/18/2011 0758    Cardiac Enzymes: No results for input(s): CKTOTAL, CKMB, CKMBINDEX, TROPONINI in the last 168 hours. CBG: No results for input(s): GLUCAP in the last 168 hours. Iron Studies:  No results for input(s): IRON, TIBC, TRANSFERRIN, FERRITIN in the last 72 hours. @lablastinr3 @ Studies/Results: No results found. Medications: . heparin 1,400 Units/hr (07/01/15 0525)   . amiodarone  200 mg Oral BID  . aspirin  325 mg Oral Daily  . carvedilol  12.5 mg Oral Q M,W,F-2000   And  . carvedilol  12.5 mg Oral Q T,Th,S,Su   And  . carvedilol  12.5 mg Oral Q T,Th,S,Su  . cinacalcet  90 mg Oral QPM  . isosorbide-hydrALAZINE  0.5 tablet Oral BID  . lidocaine-prilocaine  1 application Topical Q M,W,F  . multivitamin  1 tablet Oral QHS  . off the beat book   Does not apply Once  . pantoprazole  40 mg Oral Q1200  . sevelamer carbonate  1,600 mg Oral With snacks  . sevelamer carbonate  2,400 mg Oral TID WC  . sodium chloride flush  3 mL Intravenous Q12H  . Warfarin - Pharmacist Dosing Inpatient   Does not apply [phone]

## 2015-07-01 NOTE — Care Management Note (Signed)
Case Management Note  Patient Details  Name: [name] MRN: [id number] Date of Birth: 03-15-1961  Subjective/Objective:     Pt admitted for Atrial Flutter. HD- M,W,F. Pt transitioned to po amiodarone.                Action/Plan: No needs identified by CM at this time. Will continue to monitor.   Expected Discharge Date:                  Expected Discharge Plan:  Home/Self Care  In-House Referral:  NA  Discharge planning Services  CM Consult  Post Acute Care Choice:    Choice offered to:     DME Arranged:    DME Agency:     HH Arranged:    HH Agency:     Status of Service:  In process, will continue to follow  Medicare Important Message Given:  Yes Date Medicare IM Given:    Medicare IM give by:    Date Additional Medicare IM Given:    Additional Medicare Important Message give by:     If discussed at [place] of Stay Meetings, dates discussed:    Additional Comments:  Bethena Roys, RN 07/01/2015, 11:03 AM

## 2015-07-01 NOTE — Care Management Important Message (Signed)
Important Message  Patient Details  Name: [name] MRN: [id number] Date of Birth: 08-29-1960   Medicare Important Message Given:  Yes    [name] 07/01/2015, 10:49 AMImportant Message  Patient Details  Name: [name] MRN: [id number] Date of Birth: 1961-04-10   Medicare Important Message Given:  Yes    [name] G 07/01/2015, 10:48 AM

## 2015-07-01 NOTE — Progress Notes (Signed)
ANTICOAGULATION CONSULT NOTE - Follow Up Consult  Pharmacy Consult for Heparin --> Warfarin  Indication: A-flutter / Afib  Labs:  Recent Labs  06/29/15 0439 06/30/15 0556 06/30/15 0821 06/30/15 0822 07/01/15 0704  HGB  --  11.6*  --  11.2* 11.2*  HCT  --  35.0*  --  33.8* 34.3*  PLT  --  180  --  184 161  LABPROT 15.1 14.9  --   --  16.3*  INR 1.17 1.15  --   --  1.30  HEPARINUNFRC 0.53 0.56  --   --  0.34  CREATININE  --   --  12.94*  --   --     Estimated Creatinine Clearance: 8.7 mL/min (by C-G formula based on Cr of 12.94).  Assessment: 51 YOM presenting with new onset A-flutter.  Pharmacy to dose heparin start Warfarin with heparin bridge until INR > 2.  HL remains therapeutic at 0.34 but trending down on heparin 1400 units/hr and INR still subtherapeutic at 1.3.   Note: DDI with amiodarone (may inc warfarin effect)  Goal of Therapy:  Heparin level 0.3-0.7 units/ml Monitor platelets by anticoagulation protocol: Yes   Plan:  -Warfarin 7.5mg  tonight x1  -Cont heparin 1400 units/hr -Daily heparin level, CBC, INR  -Monitor for s/sx of bleeding.   Andrey Cota. Diona Foley, PharmD, [place] Clinical Pharmacist Pager [phone] 07/01/2015,8:23 AM

## 2015-07-02 LAB — RENAL FUNCTION PANEL
Albumin: 3.3 g/dL — ABNORMAL LOW (ref 3.5–5.0)
Anion gap: 14 (ref 5–15)
BUN: 36 mg/dL — ABNORMAL HIGH (ref 6–20)
CO2: 25 mmol/L (ref 22–32)
Calcium: 9.1 mg/dL (ref 8.9–10.3)
Chloride: 96 mmol/L — ABNORMAL LOW (ref 101–111)
Creatinine, Ser: 12.71 mg/dL — ABNORMAL HIGH (ref 0.61–1.24)
GFR calc Af Amer: 5 mL/min — ABNORMAL LOW (ref >60)
GFR calc non Af Amer: 4 mL/min — ABNORMAL LOW (ref >60)
Glucose, Bld: 87 mg/dL (ref 65–99)
Phosphorus: 4.3 mg/dL (ref 2.5–4.6)
Potassium: 4.7 mmol/L (ref 3.5–5.1)
Sodium: 135 mmol/L (ref 135–145)

## 2015-07-02 LAB — PROTIME-INR
INR: 1.38 (ref 0.00–1.49)
Prothrombin Time: 17 seconds — ABNORMAL HIGH (ref 11.6–15.2)

## 2015-07-02 LAB — CBC
HCT: 34.1 % — ABNORMAL LOW (ref 39.0–52.0)
Hemoglobin: 10.8 g/dL — ABNORMAL LOW (ref 13.0–17.0)
MCH: 30.1 pg (ref 26.0–34.0)
MCHC: 31.7 g/dL (ref 30.0–36.0)
MCV: 95 fL (ref 78.0–100.0)
Platelets: 160 10*3/uL (ref 150–400)
RBC: 3.59 MIL/uL — ABNORMAL LOW (ref 4.22–5.81)
RDW: 16.9 % — ABNORMAL HIGH (ref 11.5–15.5)
WBC: 4 10*3/uL (ref 4.0–10.5)

## 2015-07-02 LAB — BASIC METABOLIC PANEL
Anion gap: 13 (ref 5–15)
BUN: 35 mg/dL — ABNORMAL HIGH (ref 6–20)
CO2: 26 mmol/L (ref 22–32)
Calcium: 9.1 mg/dL (ref 8.9–10.3)
Chloride: 96 mmol/L — ABNORMAL LOW (ref 101–111)
Creatinine, Ser: 12.73 mg/dL — ABNORMAL HIGH (ref 0.61–1.24)
GFR calc Af Amer: 5 mL/min — ABNORMAL LOW (ref >60)
GFR calc non Af Amer: 4 mL/min — ABNORMAL LOW (ref >60)
Glucose, Bld: 90 mg/dL (ref 65–99)
Potassium: 4.7 mmol/L (ref 3.5–5.1)
Sodium: 135 mmol/L (ref 135–145)

## 2015-07-02 LAB — HEPARIN LEVEL (UNFRACTIONATED): Heparin Unfractionated: 0.31 IU/mL (ref 0.30–0.70)

## 2015-07-02 MED ORDER — SODIUM CHLORIDE 0.9 % IV SOLN: 100.0000 mL | INTRAVENOUS | Status: DC | PRN

## 2015-07-02 MED ORDER — WARFARIN SODIUM 10 MG PO TABS
10.0000 mg | ORAL_TABLET | Freq: Once | ORAL | Status: AC
Start: 2015-07-02 — End: 2015-07-02
  Administered 2015-07-02: 10 mg via ORAL
  Filled 2015-07-02: qty 1

## 2015-07-02 MED ORDER — HEPARIN SODIUM (PORCINE) 1000 UNIT/ML DIALYSIS: 1000.0000 [IU] | INTRAMUSCULAR | Status: DC | PRN

## 2015-07-02 MED ORDER — LIDOCAINE-PRILOCAINE 2.5-2.5 % EX CREA: 1.0000 "application " | TOPICAL_CREAM | CUTANEOUS | Status: DC | PRN

## 2015-07-02 MED ORDER — LIDOCAINE HCL (PF) 1 % IJ SOLN: 5.0000 mL | INTRAMUSCULAR | Status: DC | PRN

## 2015-07-02 MED ORDER — PENTAFLUOROPROP-TETRAFLUOROETH EX AERO: 1.0000 "application " | INHALATION_SPRAY | CUTANEOUS | Status: DC | PRN

## 2015-07-02 MED ORDER — ALTEPLASE 2 MG IJ SOLR
2.0000 mg | Freq: Once | INTRAMUSCULAR | Status: DC | PRN
  Filled 2015-07-02: qty 2

## 2015-07-02 NOTE — Progress Notes (Signed)
[facility] Progress Note  Assessment/Plan: 1. AFib/Aflutter with RVR: converted to SR. Started on heparin/bridging to coumadin. On PO amiodarone/coumadin per primary.  2. Systolic Heart failure: EF reduced from 25-30% (08/16) to 20-25% now. On coreg. Discussed with pt importance of fld restrictions/med compliance.  3. ESRD - MWF at [facility]. On HD today. K+4.7 4. Hypertension/volume - Amlod/ coreg. UFG 2600. BP stable   5. Anemia -Hgb 10.8, rec'd ESA dose this week. Follow HGB 6. Metabolic bone disease - continue binders, sensipar. Has had issues with hypercalcemia in past-monitor calcium. Adm C Ca 10.8 Changed to non-calcium binders, DC'd oral Vit D.  6. Nutrition - Albumin 3.3 renal diet, renal vit  Rita H. Brown NP-C 07/02/2015, 2:47 PM  [facility] [phone]  Pt seen, examined and agree w A/P as above.  Kelly Splinter MD [facility] pager [phone]    cell [phone] 07/02/2015, 3:34 PM    Subjective: "I'm doing a lot better-I feel OK". Denies SOB. Low fowlers position without SOB/WOB. SR on monitor.   Objective Filed Vitals:   07/02/15 1347 07/02/15 1403 07/02/15 1415 07/02/15 1429  BP: 116/81 114/78 108/74 96/63  Pulse: 69 69 68 68  Temp: 97.9 F (36.6 C)     TempSrc: Oral     Resp: 20 18 19 18   Height:      Weight: 112.9 kg (248 lb 14.4 oz)     SpO2: 99%      Physical Exam General: well nourished, pleasant. NAD Heart: RRR with S1 S2. II/VI systolic M.  Lungs: bilateral breath sounds CTA. No WOB.  Abdomen: soft, nontender, active BS Extremities: No LE edema Dialysis Access: LUA AVF + thrill + bruit (recent intervention at [facility]) Has small denuded area on lower portion of AVF, no drainage. Monitor.  Dialysis Orders: Center: [facility] on MWF . EDW 110 HD Bath 2.0 K 2.25 Ca Time 4 hours Heparin 12000 units at start of tx, 4000 units mid run q treatment. Access :LUA AVF BFR 450 DFR 800  Mircera 50 mcg IV q 2  weeks ( last dose 06/25/15)  Additional Objective Labs: Basic Metabolic Panel:  Recent Labs Lab 06/27/15 1619 06/28/15 0444 06/30/15 0821  NA 141 135 133*  K 3.9 4.8 5.6*  CL 98* 93* 92*  CO2 30 28 25   GLUCOSE 113* 162* 99  BUN 21* 29* 52*  CREATININE 7.63* 8.68* 12.94*  CALCIUM 9.8 10.3 8.9  PHOS  --   --  6.2*   Liver Function Tests:  Recent Labs Lab 06/27/15 1619 06/30/15 0821  AST 13*  --   ALT 12*  --   ALKPHOS 71  --   BILITOT 0.3  --   PROT 7.4  --   ALBUMIN 3.4* 3.3*    Recent Labs Lab 06/28/15 2242  LIPASE 25   CBC:  Recent Labs Lab 06/27/15 1619  06/29/15 0350 06/30/15 0556 06/30/15 0822 07/01/15 0704 07/02/15 0345  WBC 4.4  < > 5.0 5.2 4.8 3.7* 4.0  NEUTROABS 2.5  --   --   --   --   --   --   HGB 11.8*  < > 11.5* 11.6* 11.2* 11.2* 10.8*  HCT 35.4*  < > 35.2* 35.0* 33.8* 34.3* 34.1*  MCV 93.9  < > 95.7 95.1 94.9 96.3 95.0  PLT 175  < > 174 180 184 161 160  < > = values in this interval not displayed. Blood Culture    Component Value Date/Time  SDES URINE, RANDOM 01/18/2011 0758   SPECREQUEST NONE 01/18/2011 0758   CULT NO GROWTH 01/18/2011 0758   REPTSTATUS 01/19/2011 FINAL 01/18/2011 0758    Cardiac Enzymes: No results for input(s): CKTOTAL, CKMB, CKMBINDEX, TROPONINI in the last 168 hours. CBG: No results for input(s): GLUCAP in the last 168 hours. Iron Studies: No results for input(s): IRON, TIBC, TRANSFERRIN, FERRITIN in the last 72 hours. @lablastinr3 @ Studies/Results: No results found. Medications: . heparin 1,400 Units/hr (07/01/15 2222)   . amiodarone  200 mg Oral BID  . aspirin  325 mg Oral Daily  . carvedilol  12.5 mg Oral Q M,W,F-2000   And  . carvedilol  12.5 mg Oral Q T,Th,S,Su   And  . carvedilol  12.5 mg Oral Q T,Th,S,Su  . cinacalcet  90 mg Oral QPM  . isosorbide-hydrALAZINE  0.5 tablet Oral BID  . lidocaine-prilocaine  1 application Topical Q M,W,F  . multivitamin  1 tablet Oral QHS  . off the beat  book   Does not apply Once  . pantoprazole  40 mg Oral Q1200  . sevelamer carbonate  1,600 mg Oral With snacks  . sevelamer carbonate  2,400 mg Oral TID WC  . sodium chloride flush  3 mL Intravenous Q12H  . warfarin  10 mg Oral ONCE-1800  . Warfarin - Pharmacist Dosing Inpatient   Does not apply [phone]

## 2015-07-02 NOTE — Progress Notes (Signed)
Ref: Maggie Font, MD   Subjective:  Sinus rhythm with INR-1.38. Some confusion created by Lovenox injection use. Patient claims to be walking.  Objective:  Vital Signs in the last 24 hours: Temp:  [97.8 F (36.6 C)-98 F (36.7 C)] 98 F (36.7 C) (02/08 1738) Pulse Rate:  [62-69] 65 (02/08 1738) Cardiac Rhythm:  [-] Normal sinus rhythm (02/08 1350) Resp:  [15-20] 15 (02/08 1738) BP: (96-122)/(63-86) 109/69 mmHg (02/08 1738) SpO2:  [99 %-100 %] 99 % (02/08 1347) Weight:  [110.1 kg (242 lb 11.6 oz)-112.9 kg (248 lb 14.4 oz)] 110.1 kg (242 lb 11.6 oz) (02/08 1738)  Physical Exam: BP Readings from Last 1 Encounters:  07/02/15 109/69    Wt Readings from Last 1 Encounters:  07/02/15 110.1 kg (242 lb 11.6 oz)    Weight change: -1.316 kg (-2 lb 14.4 oz)  HEENT: [place]/AT, Eyes-Brown, PERL, EOMI, Conjunctiva-Pale pink, Sclera-Non-icteric Neck: No JVD, No bruit, Trachea midline. Lungs:  Clear, Bilateral. Cardiac:  Regular rhythm, normal S1 and S2, no S3. II/VI systolic murmur. Abdomen:  Soft, non-tender. Extremities:  No edema present. No cyanosis. No clubbing. Left upper extremity-AVF. CNS: AxOx3, Cranial nerves grossly intact, moves all 4 extremities. Right handed. Skin: Warm and dry.   Intake/Output from previous day: 02/07 0701 - 02/08 0700 In: 1380.3 [P.O.:1140; I.V.:240.3] Out: -     Lab Results: BMET    Component Value Date/Time   NA 135 07/02/2015 1400   NA 135 07/02/2015 1400   NA 133* 06/30/2015 0821   K 4.7 07/02/2015 1400   K 4.7 07/02/2015 1400   K 5.6* 06/30/2015 0821   CL 96* 07/02/2015 1400   CL 96* 07/02/2015 1400   CL 92* 06/30/2015 0821   CO2 26 07/02/2015 1400   CO2 25 07/02/2015 1400   CO2 25 06/30/2015 0821   GLUCOSE 90 07/02/2015 1400   GLUCOSE 87 07/02/2015 1400   GLUCOSE 99 06/30/2015 0821   BUN 35* 07/02/2015 1400   BUN 36* 07/02/2015 1400   BUN 52* 06/30/2015 0821   CREATININE 12.73* 07/02/2015 1400   CREATININE 12.71* 07/02/2015 1400   CREATININE 12.94* 06/30/2015 0821   CALCIUM 9.1 07/02/2015 1400   CALCIUM 9.1 07/02/2015 1400   CALCIUM 8.9 06/30/2015 0821   GFRNONAA 4* 07/02/2015 1400   GFRNONAA 4* 07/02/2015 1400   GFRNONAA 4* 06/30/2015 0821   GFRAA 5* 07/02/2015 1400   GFRAA 5* 07/02/2015 1400   GFRAA 4* 06/30/2015 0821   CBC    Component Value Date/Time   WBC 4.0 07/02/2015 0345   RBC 3.59* 07/02/2015 0345   HGB 10.8* 07/02/2015 0345   HCT 34.1* 07/02/2015 0345   PLT 160 07/02/2015 0345   MCV 95.0 07/02/2015 0345   MCH 30.1 07/02/2015 0345   MCHC 31.7 07/02/2015 0345   RDW 16.9* 07/02/2015 0345   LYMPHSABS 1.3 06/27/2015 1619   MONOABS 0.4 06/27/2015 1619   EOSABS 0.2 06/27/2015 1619   BASOSABS 0.0 06/27/2015 1619   HEPATIC Function Panel  Recent Labs  04/21/15 1150 04/22/15 0537 06/27/15 1619  PROT 8.7* 7.9 7.4   HEMOGLOBIN A1C No components found for: HGA1C,  MPG CARDIAC ENZYMES Lab Results  Component Value Date   CKTOTAL 113 05/20/2011   CKMB 4.3* 05/20/2011   TROPONINI 0.40* 01/14/2015   TROPONINI 0.37* 01/14/2015   TROPONINI 0.41* 01/13/2015   BNP No results for input(s): PROBNP in the last 8760 hours. TSH  Recent Labs  06/27/15 1619  TSH 0.768   CHOLESTEROL  No results for input(s): CHOL in the last 8760 hours.  Scheduled Meds: . amiodarone  200 mg Oral BID  . aspirin  325 mg Oral Daily  . carvedilol  12.5 mg Oral Q M,W,F-2000   And  . carvedilol  12.5 mg Oral Q T,Th,S,Su   And  . carvedilol  12.5 mg Oral Q T,Th,S,Su  . cinacalcet  90 mg Oral QPM  . isosorbide-hydrALAZINE  0.5 tablet Oral BID  . lidocaine-prilocaine  1 application Topical Q M,W,F  . multivitamin  1 tablet Oral QHS  . off the beat book   Does not apply Once  . pantoprazole  40 mg Oral Q1200  . sevelamer carbonate  1,600 mg Oral With snacks  . sevelamer carbonate  2,400 mg Oral TID WC  . sodium chloride flush  3 mL Intravenous Q12H  . warfarin  10 mg Oral ONCE-1800  . Warfarin - Pharmacist  Dosing Inpatient   Does not apply q1800   Continuous Infusions: . heparin 1,400 Units/hr (07/02/15 1701)   PRN Meds:.sodium chloride, gi cocktail, nitroGLYCERIN, ondansetron (ZOFRAN) IV, oxyCODONE-acetaminophen, simethicone, sodium chloride flush  Assessment/Plan: Atrial flutter with RVR-CHA2DS2VASc score of 3/9 (now) sinus rhythm Chronic left heart failure  ESRD with hemodialysis, MWF.  DM, II  Gout  Obesity CAD H/O Renal cell cancer S/P right nephrectomy Moderate MR and TR  Pharmacy increasing coumadin. Not a candidate for Lovenox use due to ESRD.     LOS: 5 days    Dixie Dials  MD  07/02/2015, 6:21 PM

## 2015-07-02 NOTE — Progress Notes (Signed)
ANTICOAGULATION CONSULT NOTE - Follow Up Consult  Pharmacy Consult for Heparin --> Warfarin  Indication: A-flutter / Afib  Labs:  Recent Labs  06/30/15 0556 06/30/15 0821 06/30/15 0822 07/01/15 0704 07/02/15 0345  HGB 11.6*  --  11.2* 11.2* 10.8*  HCT 35.0*  --  33.8* 34.3* 34.1*  PLT 180  --  184 161 160  LABPROT 14.9  --   --  16.3* 17.0*  INR 1.15  --   --  1.30 1.38  HEPARINUNFRC 0.56  --   --  0.34 0.31  CREATININE  --  12.94*  --   --   --     Estimated Creatinine Clearance: 8.8 mL/min (by C-G formula based on Cr of 12.94).  Assessment: 35 YOM presenting with new onset A-flutter.  Pharmacy to dose heparin start Warfarin with heparin bridge until INR > 2.  HL remains therapeutic at 0.31 but trending down on heparin 1400 units/hr and INR still subtherapeutic at 1.38.  Note: DDI with amiodarone (may inc warfarin effect)  Goal of Therapy:  Heparin level 0.3-0.7 units/ml Monitor platelets by anticoagulation protocol: Yes   Plan:  -Warfarin 10mg  tonight x1  -Cont heparin 1400 units/hr -Daily heparin level, CBC, INR  -Monitor for s/sx of bleeding.   Andrey Cota. Diona Foley, PharmD, BCPS Clinical Pharmacist Pager [phone] 07/02/2015,8:19 AM

## 2015-07-03 LAB — CBC
HCT: 35.3 % — ABNORMAL LOW (ref 39.0–52.0)
Hemoglobin: 11.4 g/dL — ABNORMAL LOW (ref 13.0–17.0)
MCH: 31.1 pg (ref 26.0–34.0)
MCHC: 32.3 g/dL (ref 30.0–36.0)
MCV: 96.2 fL (ref 78.0–100.0)
Platelets: 154 10*3/uL (ref 150–400)
RBC: 3.67 MIL/uL — ABNORMAL LOW (ref 4.22–5.81)
RDW: 17.5 % — ABNORMAL HIGH (ref 11.5–15.5)
WBC: 3.9 10*3/uL — ABNORMAL LOW (ref 4.0–10.5)

## 2015-07-03 LAB — PROTIME-INR
INR: 1.68 — ABNORMAL HIGH (ref 0.00–1.49)
Prothrombin Time: 19.8 seconds — ABNORMAL HIGH (ref 11.6–15.2)

## 2015-07-03 LAB — HEPARIN LEVEL (UNFRACTIONATED)
Heparin Unfractionated: 0.26 IU/mL — ABNORMAL LOW (ref 0.30–0.70)
Heparin Unfractionated: 0.33 IU/mL (ref 0.30–0.70)

## 2015-07-03 MED ORDER — ZOLPIDEM TARTRATE 5 MG PO TABS
5.0000 mg | ORAL_TABLET | Freq: Every evening | ORAL | Status: DC | PRN
  Administered 2015-07-03: 5 mg via ORAL
  Filled 2015-07-03: qty 1

## 2015-07-03 MED ORDER — ASPIRIN EC 81 MG PO TBEC
81.0000 mg | DELAYED_RELEASE_TABLET | Freq: Every day | ORAL | Status: DC
  Administered 2015-07-04 – 2015-07-05 (×2): 81 mg via ORAL
  Filled 2015-07-03 (×2): qty 1

## 2015-07-03 MED ORDER — WARFARIN SODIUM 10 MG PO TABS
10.0000 mg | ORAL_TABLET | Freq: Once | ORAL | Status: AC
  Administered 2015-07-03: 10 mg via ORAL
  Filled 2015-07-03: qty 1

## 2015-07-03 NOTE — Progress Notes (Signed)
[facility] Progress Note  Assessment/Plan: 1. AFib/Aflutter with RVR: converted to SR. On coumadin, awaiting INR in range. INR coming up, 1.6 today. 2. Systolic Heart failure: EF reduced from 25-30% (08/16) to 20-25% now. On coreg. Discussed with pt importance of fld restrictions/med compliance.  3. ESRD - MWF at [facility]. On HD today. K+4.7 4. Hypertension/volume - Amlod/ coreg. UFG 2600. BP stable   5. Anemia -Hgb 10.8, rec'd ESA dose this week. Follow HGB 6. Metabolic bone disease - continue binders, sensipar. Has had issues with hypercalcemia in past-monitor calcium. Adm C Ca 10.8 Changed to non-calcium binders, DC'd oral Vit D.  6. Nutrition - Albumin 3.3 renal diet, renal vit   Kelly Splinter MD [facility] pager [phone]    cell [phone] 07/03/2015, 12:13 PM    Subjective: stable   Objective Filed Vitals:   07/02/15 2024 07/02/15 2127 07/03/15 0542 07/03/15 0808  BP: 109/72 114/74 124/82   Pulse: 74 73 70   Temp:  98.5 F (36.9 C) 98.4 F (36.9 C)   TempSrc:  Oral Oral   Resp:  18 20   Height:      Weight:   109.952 kg (242 lb 6.4 oz)   SpO2:  99% 100% 100%   Physical Exam General: well nourished, pleasant. NAD Heart: RRR with S1 S2. II/VI systolic M.  Lungs: bilateral breath sounds CTA. No WOB.  Abdomen: soft, nontender, active BS Extremities: No LE edema Dialysis Access: LUA AVF + thrill + bruit (recent intervention at [facility]) Has small denuded area on lower portion of AVF, no drainage. Monitor.  Dialysis Orders: Center: [facility] on MWF . EDW 110 HD Bath 2.0 K 2.25 Ca Time 4 hours Heparin 12000 units at start of tx, 4000 units mid run q treatment. Access :LUA AVF BFR 450 DFR 800  Mircera 50 mcg IV q 2 weeks ( last dose 06/25/15)  Additional Objective Labs: Basic Metabolic Panel:  Recent Labs Lab 06/28/15 0444 06/30/15 0821 07/02/15 1400  NA 135 133* 135  135  K 4.8 5.6* 4.7  4.7  CL 93* 92* 96*  96*  CO2  28 25 25  26   GLUCOSE 162* 99 87  90  BUN 29* 52* 36*  35*  CREATININE 8.68* 12.94* 12.71*  12.73*  CALCIUM 10.3 8.9 9.1  9.1  PHOS  --  6.2* 4.3   Liver Function Tests:  Recent Labs Lab 06/27/15 1619 06/30/15 0821 07/02/15 1400  AST 13*  --   --   ALT 12*  --   --   ALKPHOS 71  --   --   BILITOT 0.3  --   --   PROT 7.4  --   --   ALBUMIN 3.4* 3.3* 3.3*    Recent Labs Lab 06/28/15 2242  LIPASE 25   CBC:  Recent Labs Lab 06/27/15 1619  06/30/15 0556 06/30/15 0822 07/01/15 0704 07/02/15 0345 07/03/15 0632  WBC 4.4  < > 5.2 4.8 3.7* 4.0 3.9*  NEUTROABS 2.5  --   --   --   --   --   --   HGB 11.8*  < > 11.6* 11.2* 11.2* 10.8* 11.4*  HCT 35.4*  < > 35.0* 33.8* 34.3* 34.1* 35.3*  MCV 93.9  < > 95.1 94.9 96.3 95.0 96.2  PLT 175  < > 180 184 161 160 154  < > = values in this interval not displayed. Blood Culture    Component Value Date/Time   SDES URINE,  RANDOM 01/18/2011 0758   SPECREQUEST NONE 01/18/2011 0758   CULT NO GROWTH 01/18/2011 0758   REPTSTATUS 01/19/2011 FINAL 01/18/2011 0758    Cardiac Enzymes: No results for input(s): CKTOTAL, CKMB, CKMBINDEX, TROPONINI in the last 168 hours. CBG: No results for input(s): GLUCAP in the last 168 hours. Iron Studies: No results for input(s): IRON, TIBC, TRANSFERRIN, FERRITIN in the last 72 hours. @lablastinr3 @ Studies/Results: No results found. Medications: . heparin 1,600 Units/hr (07/03/15 1143)   . amiodarone  200 mg Oral BID  . aspirin EC  81 mg Oral Daily  . carvedilol  12.5 mg Oral Q M,W,F-2000   And  . carvedilol  12.5 mg Oral Q T,Th,S,Su   And  . carvedilol  12.5 mg Oral Q T,Th,S,Su  . cinacalcet  90 mg Oral QPM  . isosorbide-hydrALAZINE  0.5 tablet Oral BID  . lidocaine-prilocaine  1 application Topical Q M,W,F  . multivitamin  1 tablet Oral QHS  . off the beat book   Does not apply Once  . pantoprazole  40 mg Oral Q1200  . sevelamer carbonate  1,600 mg Oral With snacks  . sevelamer  carbonate  2,400 mg Oral TID WC  . sodium chloride flush  3 mL Intravenous Q12H  . warfarin  10 mg Oral ONCE-1800  . Warfarin - Pharmacist Dosing Inpatient   Does not apply [phone]

## 2015-07-03 NOTE — Progress Notes (Signed)
ANTICOAGULATION CONSULT NOTE - Follow Up Consult  Pharmacy Consult for Heparin Indication: A-flutter / Afib  Labs:  Recent Labs  07/01/15 0704 07/02/15 0345 07/02/15 1400 07/03/15 0632 07/03/15 1538  HGB 11.2* 10.8*  --  11.4*  --   HCT 34.3* 34.1*  --  35.3*  --   PLT 161 160  --  154  --   LABPROT 16.3* 17.0*  --  19.8*  --   INR 1.30 1.38  --  1.68*  --   HEPARINUNFRC 0.34 0.31  --  0.26* 0.33  CREATININE  --   --  12.71*  12.73*  --   --     Estimated Creatinine Clearance: 8.8 mL/min (by C-G formula based on Cr of 12.73).  . heparin 1,600 Units/hr (07/03/15 1143)     Assessment: 13 YOM presenting with new onset A-flutter.  Pt on heparin bridge to warfarin until INR > 2. INR trending up to 1.68. Heparin level therapeutic at 0.33. CBC stable, No bleeding noted.  Note: DDI with amiodarone (may inc warfarin effect)  Goal of Therapy:  Heparin level 0.3-0.7 units/ml Monitor platelets by anticoagulation protocol: Yes   Plan:  -Continue IV heparin at current rate. -Daily heparin level and CBC. -Consider decreasing ASA to 81mg   Uvaldo Rising, BCPS  Clinical Pharmacist Pager [phone]  07/03/2015 4:38 PM

## 2015-07-03 NOTE — Progress Notes (Signed)
ANTICOAGULATION CONSULT NOTE - Follow Up Consult  Pharmacy Consult for Heparin --> Warfarin  Indication: A-flutter / Afib  Labs:  Recent Labs  06/30/15 0821  07/01/15 0704 07/02/15 0345 07/02/15 1400 07/03/15 0632  HGB  --   < > 11.2* 10.8*  --  11.4*  HCT  --   < > 34.3* 34.1*  --  35.3*  PLT  --   < > 161 160  --  154  LABPROT  --   --  16.3* 17.0*  --  19.8*  INR  --   --  1.30 1.38  --  1.68*  HEPARINUNFRC  --   --  0.34 0.31  --  0.26*  CREATININE 12.94*  --   --   --  12.71*  12.73*  --   < > = values in this interval not displayed.  Estimated Creatinine Clearance: 8.8 mL/min (by C-G formula based on Cr of 12.73).  Assessment: 53 YOM presenting with new onset A-flutter.  Pt on heparin bridge to warfarin until INR > 2. INR trending up to 1.68. Heparin level 0.26 (slightly subtherapeutic). CBC stable, No bleeding noted.  Note: DDI with amiodarone (may inc warfarin effect)  Goal of Therapy:  Heparin level 0.3-0.7 units/ml Monitor platelets by anticoagulation protocol: Yes   Plan:  -Warfarin 10mg  again tonight -Increase heparin to 1600 units/hr -F/u 8 hr heparin level -Consider decreasing ASA to 81mg   Sherlon Handing, PharmD, BCPS Clinical pharmacist, pager [phone] 07/03/2015,7:29 AM

## 2015-07-03 NOTE — Plan of Care (Signed)
Problem: Education: Goal: Understanding of medication regimen will improve Outcome: Progressing This is this RN's third night with patient.  Each night RN has discussed Pacerone/Amiodarone medication with patient prior to administering.  Patient listens to RN and states understanding.  This evening RN asked patient if he knew why he was taking Pacerone/Amiodarone and patient unable to tell RN.  RN explained that it was to help control patient's heart rhythm/to help keep patient's heart from beating irregularly.  Patient stated understanding.

## 2015-07-04 LAB — RENAL FUNCTION PANEL
Albumin: 3.2 g/dL — ABNORMAL LOW (ref 3.5–5.0)
Anion gap: 14 (ref 5–15)
BUN: 30 mg/dL — ABNORMAL HIGH (ref 6–20)
CO2: 26 mmol/L (ref 22–32)
Calcium: 9.1 mg/dL (ref 8.9–10.3)
Chloride: 96 mmol/L — ABNORMAL LOW (ref 101–111)
Creatinine, Ser: 11.14 mg/dL — ABNORMAL HIGH (ref 0.61–1.24)
GFR calc Af Amer: 5 mL/min — ABNORMAL LOW (ref >60)
GFR calc non Af Amer: 5 mL/min — ABNORMAL LOW (ref >60)
Glucose, Bld: 89 mg/dL (ref 65–99)
Phosphorus: 4.6 mg/dL (ref 2.5–4.6)
Potassium: 4.7 mmol/L (ref 3.5–5.1)
Sodium: 136 mmol/L (ref 135–145)

## 2015-07-04 LAB — CBC
HCT: 35.9 % — ABNORMAL LOW (ref 39.0–52.0)
Hemoglobin: 11.2 g/dL — ABNORMAL LOW (ref 13.0–17.0)
MCH: 29.9 pg (ref 26.0–34.0)
MCHC: 31.2 g/dL (ref 30.0–36.0)
MCV: 95.7 fL (ref 78.0–100.0)
Platelets: 150 10*3/uL (ref 150–400)
RBC: 3.75 MIL/uL — ABNORMAL LOW (ref 4.22–5.81)
RDW: 17.5 % — ABNORMAL HIGH (ref 11.5–15.5)
WBC: 3.9 10*3/uL — ABNORMAL LOW (ref 4.0–10.5)

## 2015-07-04 LAB — HEPARIN LEVEL (UNFRACTIONATED): Heparin Unfractionated: 0.41 IU/mL (ref 0.30–0.70)

## 2015-07-04 LAB — PROTIME-INR
INR: 1.8 — ABNORMAL HIGH (ref 0.00–1.49)
Prothrombin Time: 20.9 seconds — ABNORMAL HIGH (ref 11.6–15.2)

## 2015-07-04 MED ORDER — SODIUM CHLORIDE 0.9 % IV SOLN: 100.0000 mL | INTRAVENOUS | Status: DC | PRN

## 2015-07-04 MED ORDER — WARFARIN SODIUM 2.5 MG PO TABS
12.5000 mg | ORAL_TABLET | Freq: Every day | ORAL | Status: DC
  Administered 2015-07-04: 12.5 mg via ORAL
  Filled 2015-07-04: qty 1

## 2015-07-04 MED ORDER — LIDOCAINE HCL (PF) 1 % IJ SOLN
5.0000 mL | INTRAMUSCULAR | Status: DC | PRN
Start: 2015-07-04 — End: 2015-07-04

## 2015-07-04 MED ORDER — LIDOCAINE-PRILOCAINE 2.5-2.5 % EX CREA: 1.0000 "application " | TOPICAL_CREAM | CUTANEOUS | Status: DC | PRN

## 2015-07-04 MED ORDER — LORAZEPAM 1 MG PO TABS
1.0000 mg | ORAL_TABLET | Freq: Once | ORAL | Status: AC
  Administered 2015-07-04: 1 mg via ORAL
  Filled 2015-07-04: qty 1

## 2015-07-04 MED ORDER — DOXERCALCIFEROL 4 MCG/2ML IV SOLN
INTRAVENOUS | Status: AC
  Filled 2015-07-04: qty 2

## 2015-07-04 MED ORDER — ALTEPLASE 2 MG IJ SOLR
2.0000 mg | Freq: Once | INTRAMUSCULAR | Status: DC | PRN
  Filled 2015-07-04: qty 2

## 2015-07-04 MED ORDER — DOXERCALCIFEROL 4 MCG/2ML IV SOLN
2.0000 ug | INTRAVENOUS | Status: DC
  Administered 2015-07-04: 2 ug via INTRAVENOUS

## 2015-07-04 MED ORDER — PENTAFLUOROPROP-TETRAFLUOROETH EX AERO: 1.0000 "application " | INHALATION_SPRAY | CUTANEOUS | Status: DC | PRN

## 2015-07-04 MED ORDER — HEPARIN SODIUM (PORCINE) 1000 UNIT/ML DIALYSIS
1000.0000 [IU] | INTRAMUSCULAR | Status: DC | PRN
  Filled 2015-07-04: qty 1

## 2015-07-04 NOTE — Progress Notes (Signed)
ANTICOAGULATION CONSULT NOTE - Follow Up Consult  Pharmacy Consult for Heparin, Warfarin  Indication: A-flutter / Afib  Labs:  Recent Labs  07/02/15 0345 07/02/15 1400 07/03/15 0632 07/03/15 1538 07/04/15 0500 07/04/15 0726  HGB 10.8*  --  11.4*  --  11.2*  --   HCT 34.1*  --  35.3*  --  35.9*  --   PLT 160  --  154  --  150  --   LABPROT 17.0*  --  19.8*  --  20.9*  --   INR 1.38  --  1.68*  --  1.80*  --   HEPARINUNFRC 0.31  --  0.26* 0.33 0.41  --   CREATININE  --  12.71*  12.73*  --   --   --  11.14*    Estimated Creatinine Clearance: 10.1 mL/min (by C-G formula based on Cr of 11.14).  Assessment: 29 YOM presenting with new onset A-flutter.  Pharmacy dosing Warfarin with heparin bridge until INR > 2.  HL remains therapeutic on heparin 1600 units/hr and INR still subtherapeutic at 1.8 but trending up nicely. Anticipate INR to be therapeutic tomorrow   Note: DDI with amiodarone (may inc warfarin effect)  Goal of Therapy:  Heparin level 0.3-0.7 units/ml Monitor platelets by anticoagulation protocol: Yes   Plan:  -Warfarin 12.5 mg tonight x1. If therapeutic tomorrow and planning discharge, consider discharging on Coumadin 10 mg daily with f/u INR in 2-3 days  -Cont heparin 1600 units/hr -Daily heparin level, CBC, INR  -Monitor for s/sx of bleeding.   Albertina Parr, PharmD., BCPS Clinical Pharmacist Pager [phone]

## 2015-07-04 NOTE — Progress Notes (Signed)
[facility] Progress Note  Assessment/Plan: 1. AFib/Aflutter with RVR: converted to SR. On coumadin, awaiting INR in range. 2. Systolic Heart failure: EF reduced from 25-30% (08/16) to 20-25% now. On coreg. Discussed with pt importance of fld restrictions/med compliance.  3. ESRD - MWF at [facility]. On HD today. K+4.7 4. Hypertension/volume - Amlod/ coreg. BP controlled - goal today - prone to high IDWG -standing wt pre HD 111.6 - goal 2.1 5. Anemia -Hgb 11.2, - last ESA dose 2/1  6. Metabolic bone disease - continue binders, sensipar. Has had issues with hypercalcemia in past-monitor calcium. Correct Ca < 10 P 4.6 here  Changed to non-calcium binders, DC'd oral Vit D.-iPTH 1500 resume hectorol at 2 mcg  6. Nutrition - Albumin 3.3 renal diet, renal vit  Myriam Jacobson, PA-C [place] 07/04/2015,9:11 AM  LOS: 7 days   Pt seen, examined and agree w A/P as above.  Kelly Splinter MD [facility] pager [phone]    cell [phone] 07/04/2015, 11:16 AM    Subjective:   No c/o  Objective Filed Vitals:   07/04/15 0718 07/04/15 0730 07/04/15 0800 07/04/15 0830  BP: 124/94 130/94 124/84 121/83  Pulse: 71 70 65 68  Temp:      TempSrc:      Resp:      Height:      Weight:      SpO2:       Physical Exam General: NAD on HD Heart: RRR Lungs: no rales Abdomen: soft obese Extremities: no LE edema Dialysis Access: left upper AVF  Dialysis Orders: Center: [facility] on MWF . EDW 110 HD [place] 2.0 K 2.25 Ca Time 4 hours Heparin 12000 units at start of tx, 4000 units mid run q treatment. Access :LUA AVF BFR 450 DFR 800  Mircera 50 mcg IV q 2 weeks ( last dose 06/25/15) hectorol 5 until 1/30 - iPTH 1500 -    Additional Objective Labs: Basic Metabolic Panel:  Recent Labs Lab 06/30/15 0821 07/02/15 1400 07/04/15 0726  NA 133* 135  135 136  K 5.6* 4.7  4.7 4.7  CL 92* 96*  96* 96*  CO2 25 25  26 26   GLUCOSE  99 87  90 89  BUN 52* 36*  35* 30*  CREATININE 12.94* 12.71*  12.73* 11.14*  CALCIUM 8.9 9.1  9.1 9.1  PHOS 6.2* 4.3 4.6   Liver Function Tests:  Recent Labs Lab 06/27/15 1619 06/30/15 0821 07/02/15 1400 07/04/15 0726  AST 13*  --   --   --   ALT 12*  --   --   --   ALKPHOS 71  --   --   --   BILITOT 0.3  --   --   --   PROT 7.4  --   --   --   ALBUMIN 3.4* 3.3* 3.3* 3.2*    Recent Labs Lab 06/28/15 2242  LIPASE 25   CBC:  Recent Labs Lab 06/27/15 1619  06/30/15 0822 07/01/15 0704 07/02/15 0345 07/03/15 0632 07/04/15 0500  WBC 4.4  < > 4.8 3.7* 4.0 3.9* 3.9*  NEUTROABS 2.5  --   --   --   --   --   --   HGB 11.8*  < > 11.2* 11.2* 10.8* 11.4* 11.2*  HCT 35.4*  < > 33.8* 34.3* 34.1* 35.3* 35.9*  MCV 93.9  < > 94.9 96.3 95.0 96.2 95.7  PLT 175  < > 184 161  160 154 150  < > = values in this interval not displayed. Blood Culture    Component Value Date/Time   SDES URINE, RANDOM 01/18/2011 0758   SPECREQUEST NONE 01/18/2011 0758   CULT NO GROWTH 01/18/2011 0758   REPTSTATUS 01/19/2011 FINAL 01/18/2011 0758   Lab Results  Component Value Date   INR 1.80* 07/04/2015   INR 1.68* 07/03/2015   INR 1.38 07/02/2015    Medications: . heparin 1,600 Units/hr (07/04/15 0445)   . amiodarone  200 mg Oral BID  . aspirin EC  81 mg Oral Daily  . carvedilol  12.5 mg Oral Q M,W,F-2000   And  . carvedilol  12.5 mg Oral Q T,Th,S,Su   And  . carvedilol  12.5 mg Oral Q T,Th,S,Su  . cinacalcet  90 mg Oral QPM  . isosorbide-hydrALAZINE  0.5 tablet Oral BID  . lidocaine-prilocaine  1 application Topical Q M,W,F  . multivitamin  1 tablet Oral QHS  . off the beat book   Does not apply Once  . pantoprazole  40 mg Oral Q1200  . sevelamer carbonate  1,600 mg Oral With snacks  . sevelamer carbonate  2,400 mg Oral TID WC  . sodium chloride flush  3 mL Intravenous Q12H  . Warfarin - Pharmacist Dosing Inpatient   Does not apply [phone]

## 2015-07-04 NOTE — Progress Notes (Signed)
Ref: Maggie Font, MD   Subjective:  Understood need to continue IV heparin till INR over 2.  Objective:  Vital Signs in the last 24 hours: Temp:  [97.1 F (36.2 C)-98.2 F (36.8 C)] 97.1 F (36.2 C) (02/10 0713) Pulse Rate:  [64-72] 72 (02/10 0930) Cardiac Rhythm:  [-] Normal sinus rhythm (02/09 2000) Resp:  [17-23] 23 (02/10 0713) BP: (111-131)/(67-94) 127/91 mmHg (02/10 0930) SpO2:  [99 %-100 %] 99 % (02/10 0713) Weight:  [111.494 kg (245 lb 12.8 oz)-111.6 kg (246 lb 0.5 oz)] 111.6 kg (246 lb 0.5 oz) (02/10 [id number])  Physical Exam: BP Readings from Last 1 Encounters:  07/04/15 127/91    Wt Readings from Last 1 Encounters:  07/04/15 111.6 kg (246 lb 0.5 oz)    Weight change: -1.406 kg (-3 lb 1.6 oz)  HEENT: [place]/AT, Eyes-Brown, PERL, EOMI, Conjunctiva-Pale pink, Sclera-Non-icteric Neck: No JVD, No bruit, Trachea midline. Lungs:  Clear, Bilateral. Cardiac:  Regular rhythm, normal S1 and S2, no S3. II/VI systolic murmur. Abdomen:  Soft, non-tender. Extremities:  No edema present. No cyanosis. No clubbing. AVF in left upper extremity. CNS: AxOx3, Cranial nerves grossly intact, moves all 4 extremities. Right handed. Skin: Warm and dry.   Intake/Output from previous day: 02/09 0701 - 02/10 0700 In: 360 [P.O.:360] Out: -     Lab Results: BMET    Component Value Date/Time   NA 136 07/04/2015 0726   NA 135 07/02/2015 1400   NA 135 07/02/2015 1400   K 4.7 07/04/2015 0726   K 4.7 07/02/2015 1400   K 4.7 07/02/2015 1400   CL 96* 07/04/2015 0726   CL 96* 07/02/2015 1400   CL 96* 07/02/2015 1400   CO2 26 07/04/2015 0726   CO2 26 07/02/2015 1400   CO2 25 07/02/2015 1400   GLUCOSE 89 07/04/2015 0726   GLUCOSE 90 07/02/2015 1400   GLUCOSE 87 07/02/2015 1400   BUN 30* 07/04/2015 0726   BUN 35* 07/02/2015 1400   BUN 36* 07/02/2015 1400   CREATININE 11.14* 07/04/2015 0726   CREATININE 12.73* 07/02/2015 1400   CREATININE 12.71* 07/02/2015 1400   CALCIUM 9.1 07/04/2015  0726   CALCIUM 9.1 07/02/2015 1400   CALCIUM 9.1 07/02/2015 1400   GFRNONAA 5* 07/04/2015 0726   GFRNONAA 4* 07/02/2015 1400   GFRNONAA 4* 07/02/2015 1400   GFRAA 5* 07/04/2015 0726   GFRAA 5* 07/02/2015 1400   GFRAA 5* 07/02/2015 1400   CBC    Component Value Date/Time   WBC 3.9* 07/04/2015 0500   RBC 3.75* 07/04/2015 0500   HGB 11.2* 07/04/2015 0500   HCT 35.9* 07/04/2015 0500   PLT 150 07/04/2015 0500   MCV 95.7 07/04/2015 0500   MCH 29.9 07/04/2015 0500   MCHC 31.2 07/04/2015 0500   RDW 17.5* 07/04/2015 0500   LYMPHSABS 1.3 06/27/2015 1619   MONOABS 0.4 06/27/2015 1619   EOSABS 0.2 06/27/2015 1619   BASOSABS 0.0 06/27/2015 1619   HEPATIC Function Panel  Recent Labs  04/21/15 1150 04/22/15 0537 06/27/15 1619  PROT 8.7* 7.9 7.4   HEMOGLOBIN A1C No components found for: HGA1C,  MPG CARDIAC ENZYMES Lab Results  Component Value Date   CKTOTAL 113 05/20/2011   CKMB 4.3* 05/20/2011   TROPONINI 0.40* 01/14/2015   TROPONINI 0.37* 01/14/2015   TROPONINI 0.41* 01/13/2015   BNP No results for input(s): PROBNP in the last 8760 hours. TSH  Recent Labs  06/27/15 1619  TSH 0.768   CHOLESTEROL No results for input(s):  CHOL in the last 8760 hours.  Scheduled Meds: . amiodarone  200 mg Oral BID  . aspirin EC  81 mg Oral Daily  . carvedilol  12.5 mg Oral Q M,W,F-2000   And  . carvedilol  12.5 mg Oral Q T,Th,S,Su   And  . carvedilol  12.5 mg Oral Q T,Th,S,Su  . cinacalcet  90 mg Oral QPM  . doxercalciferol      . doxercalciferol  2 mcg Intravenous Q M,W,F-HD  . isosorbide-hydrALAZINE  0.5 tablet Oral BID  . lidocaine-prilocaine  1 application Topical Q M,W,F  . multivitamin  1 tablet Oral QHS  . off the beat book   Does not apply Once  . pantoprazole  40 mg Oral Q1200  . sevelamer carbonate  1,600 mg Oral With snacks  . sevelamer carbonate  2,400 mg Oral TID WC  . sodium chloride flush  3 mL Intravenous Q12H  . Warfarin - Pharmacist Dosing Inpatient   Does  not apply q1800   Continuous Infusions: . heparin 1,600 Units/hr (07/04/15 0445)   PRN Meds:.sodium chloride, sodium chloride, sodium chloride, alteplase, gi cocktail, heparin, lidocaine (PF), lidocaine-prilocaine, nitroGLYCERIN, ondansetron (ZOFRAN) IV, oxyCODONE-acetaminophen, pentafluoroprop-tetrafluoroeth, simethicone, sodium chloride flush, zolpidem  Assessment/Plan: Atrial flutter with RVR-CHA2DS2VASc score of 3/9 (now) sinus rhythm Chronic left heart failure  ESRD with hemodialysis, MWF.  DM, II  Gout  Obesity CAD H/O Renal cell cancer S/P right nephrectomy Moderate MR and TR  Continue heparin and coumadin. Dialysis per renal doctor.    LOS: 7 days    Dixie Dials  MD  07/04/2015, 9:59 AM

## 2015-07-04 NOTE — Progress Notes (Signed)
Ref: Maggie Font, MD   Subjective:  Feeling well. Seen in dialysis unit. Afebrile.  Objective:  Vital Signs in the last 24 hours: Temp:  [97.1 F (36.2 C)-98.2 F (36.8 C)] 97.1 F (36.2 C) (02/10 0713) Pulse Rate:  [64-72] 72 (02/10 0930) Cardiac Rhythm:  [-] Normal sinus rhythm (02/09 2000) Resp:  [17-23] 23 (02/10 0713) BP: (111-131)/(67-94) 127/91 mmHg (02/10 0930) SpO2:  [99 %-100 %] 99 % (02/10 0713) Weight:  [111.494 kg (245 lb 12.8 oz)-111.6 kg (246 lb 0.5 oz)] 111.6 kg (246 lb 0.5 oz) (02/10 [id number])  Physical Exam: BP Readings from Last 1 Encounters:  07/04/15 127/91    Wt Readings from Last 1 Encounters:  07/04/15 111.6 kg (246 lb 0.5 oz)    Weight change: -1.406 kg (-3 lb 1.6 oz)  HEENT: [place]/AT, Eyes-Brown, PERL, EOMI, Conjunctiva-Pale pink, Sclera-Non-icteric Neck: No JVD, No bruit, Trachea midline. Lungs:  Clear, Bilateral. Cardiac:  Regular rhythm, normal S1 and S2, no S3.  Abdomen:  Soft, non-tender. Extremities:  No edema present. Left upper extremity AV fistula. No cyanosis. No clubbing. CNS: AxOx3, Cranial nerves grossly intact, moves all 4 extremities. Right handed. Skin: Warm and dry.   Intake/Output from previous day: 02/09 0701 - 02/10 0700 In: 360 [P.O.:360] Out: -     Lab Results: BMET    Component Value Date/Time   NA 136 07/04/2015 0726   NA 135 07/02/2015 1400   NA 135 07/02/2015 1400   K 4.7 07/04/2015 0726   K 4.7 07/02/2015 1400   K 4.7 07/02/2015 1400   CL 96* 07/04/2015 0726   CL 96* 07/02/2015 1400   CL 96* 07/02/2015 1400   CO2 26 07/04/2015 0726   CO2 26 07/02/2015 1400   CO2 25 07/02/2015 1400   GLUCOSE 89 07/04/2015 0726   GLUCOSE 90 07/02/2015 1400   GLUCOSE 87 07/02/2015 1400   BUN 30* 07/04/2015 0726   BUN 35* 07/02/2015 1400   BUN 36* 07/02/2015 1400   CREATININE 11.14* 07/04/2015 0726   CREATININE 12.73* 07/02/2015 1400   CREATININE 12.71* 07/02/2015 1400   CALCIUM 9.1 07/04/2015 0726   CALCIUM 9.1 07/02/2015  1400   CALCIUM 9.1 07/02/2015 1400   GFRNONAA 5* 07/04/2015 0726   GFRNONAA 4* 07/02/2015 1400   GFRNONAA 4* 07/02/2015 1400   GFRAA 5* 07/04/2015 0726   GFRAA 5* 07/02/2015 1400   GFRAA 5* 07/02/2015 1400   CBC    Component Value Date/Time   WBC 3.9* 07/04/2015 0500   RBC 3.75* 07/04/2015 0500   HGB 11.2* 07/04/2015 0500   HCT 35.9* 07/04/2015 0500   PLT 150 07/04/2015 0500   MCV 95.7 07/04/2015 0500   MCH 29.9 07/04/2015 0500   MCHC 31.2 07/04/2015 0500   RDW 17.5* 07/04/2015 0500   LYMPHSABS 1.3 06/27/2015 1619   MONOABS 0.4 06/27/2015 1619   EOSABS 0.2 06/27/2015 1619   BASOSABS 0.0 06/27/2015 1619   HEPATIC Function Panel  Recent Labs  04/21/15 1150 04/22/15 0537 06/27/15 1619  PROT 8.7* 7.9 7.4   HEMOGLOBIN A1C No components found for: HGA1C,  MPG CARDIAC ENZYMES Lab Results  Component Value Date   CKTOTAL 113 05/20/2011   CKMB 4.3* 05/20/2011   TROPONINI 0.40* 01/14/2015   TROPONINI 0.37* 01/14/2015   TROPONINI 0.41* 01/13/2015   BNP No results for input(s): PROBNP in the last 8760 hours. TSH  Recent Labs  06/27/15 1619  TSH 0.768   CHOLESTEROL No results for input(s): CHOL in the last 8760  hours.  Scheduled Meds: . amiodarone  200 mg Oral BID  . aspirin EC  81 mg Oral Daily  . carvedilol  12.5 mg Oral Q M,W,F-2000   And  . carvedilol  12.5 mg Oral Q T,Th,S,Su   And  . carvedilol  12.5 mg Oral Q T,Th,S,Su  . cinacalcet  90 mg Oral QPM  . doxercalciferol      . doxercalciferol  2 mcg Intravenous Q M,W,F-HD  . isosorbide-hydrALAZINE  0.5 tablet Oral BID  . lidocaine-prilocaine  1 application Topical Q M,W,F  . multivitamin  1 tablet Oral QHS  . off the beat book   Does not apply Once  . pantoprazole  40 mg Oral Q1200  . sevelamer carbonate  1,600 mg Oral With snacks  . sevelamer carbonate  2,400 mg Oral TID WC  . sodium chloride flush  3 mL Intravenous Q12H  . Warfarin - Pharmacist Dosing Inpatient   Does not apply q1800   Continuous  Infusions: . heparin 1,600 Units/hr (07/04/15 0445)   PRN Meds:.sodium chloride, sodium chloride, sodium chloride, alteplase, gi cocktail, heparin, lidocaine (PF), lidocaine-prilocaine, nitroGLYCERIN, ondansetron (ZOFRAN) IV, oxyCODONE-acetaminophen, pentafluoroprop-tetrafluoroeth, simethicone, sodium chloride flush, zolpidem  Assessment/Plan: Atrial flutter with RVR-CHA2DS2VASc score of 3/9 (now) sinus rhythm Chronic left heart failure  ESRD with hemodialysis, MWF.  DM, II  Gout  Obesity CAD H/O Renal cell cancer S/P right nephrectomy Moderate MR and TR   Discussed avoiding vitamin K rich food- mostly green leafy vegetables, salad and broccoli. Discussed care with wife. INR-1.8.   LOS: 7 days    Dixie Dials  MD  07/04/2015, 10:02 AM

## 2015-07-05 LAB — CBC
HCT: 36.6 % — ABNORMAL LOW (ref 39.0–52.0)
Hemoglobin: 11.6 g/dL — ABNORMAL LOW (ref 13.0–17.0)
MCH: 30.4 pg (ref 26.0–34.0)
MCHC: 31.7 g/dL (ref 30.0–36.0)
MCV: 95.8 fL (ref 78.0–100.0)
Platelets: 151 10*3/uL (ref 150–400)
RBC: 3.82 MIL/uL — ABNORMAL LOW (ref 4.22–5.81)
RDW: 17.5 % — ABNORMAL HIGH (ref 11.5–15.5)
WBC: 3.6 10*3/uL — ABNORMAL LOW (ref 4.0–10.5)

## 2015-07-05 LAB — PROTIME-INR
INR: 2.05 — ABNORMAL HIGH (ref 0.00–1.49)
Prothrombin Time: 23 seconds — ABNORMAL HIGH (ref 11.6–15.2)

## 2015-07-05 LAB — HEPARIN LEVEL (UNFRACTIONATED): Heparin Unfractionated: 0.39 IU/mL (ref 0.30–0.70)

## 2015-07-05 MED ORDER — CEPHALEXIN 500 MG PO CAPS: 500.0000 mg | ORAL_CAPSULE | Freq: Three times a day (TID) | ORAL | Status: DC

## 2015-07-05 MED ORDER — WARFARIN SODIUM 7.5 MG PO TABS: 7.5000 mg | ORAL_TABLET | Freq: Every day | ORAL | Status: DC

## 2015-07-05 MED ORDER — SEVELAMER CARBONATE 800 MG PO TABS: 2400.0000 mg | ORAL_TABLET | Freq: Three times a day (TID) | ORAL | Status: DC

## 2015-07-05 MED ORDER — CARVEDILOL 25 MG PO TABS: 12.5000 mg | ORAL_TABLET | ORAL | Status: DC

## 2015-07-05 MED ORDER — CEPHALEXIN 500 MG PO CAPS: 500.0000 mg | ORAL_CAPSULE | Freq: Every day | ORAL | Status: DC

## 2015-07-05 MED ORDER — AMIODARONE HCL 200 MG PO TABS: 100.0000 mg | ORAL_TABLET | Freq: Every day | ORAL | Status: DC

## 2015-07-05 MED ORDER — WARFARIN SODIUM 6 MG PO TABS: 6.0000 mg | ORAL_TABLET | Freq: Every day | ORAL | Status: DC

## 2015-07-05 NOTE — Progress Notes (Addendum)
ANTICOAGULATION CONSULT NOTE - Follow Up Consult  Pharmacy Consult for Heparin, Warfarin  Indication: A-flutter / Afib  Labs:  Recent Labs  07/02/15 1400  07/03/15 0632 07/03/15 1538 07/04/15 0500 07/04/15 0726 07/05/15 0431  HGB  --   < > 11.4*  --  11.2*  --  11.6*  HCT  --   --  35.3*  --  35.9*  --  36.6*  PLT  --   --  154  --  150  --  151  LABPROT  --   --  19.8*  --  20.9*  --  23.0*  INR  --   --  1.68*  --  1.80*  --  2.05*  HEPARINUNFRC  --   < > 0.26* 0.33 0.41  --  0.39  CREATININE 12.71*  12.73*  --   --   --   --  11.14*  --   < > = values in this interval not displayed.  Estimated Creatinine Clearance: 10.1 mL/min (by C-G formula based on Cr of 11.14).  Assessment: [name] presenting with new onset A-flutter.  Pharmacy dosing Warfarin with heparin bridge until INR > 2.  HL remains therapeutic on heparin 1600 units/hr and INR therapeutic today at 2.05. CBC remains stable. Average dose of ~10 mg/d in past five days, however recommend lower dose on discharge d/t DDI with new initiation of amiodarone. Also, first day of therapeutic INR, which is likely to continue trending up over next few days.   Goal of Therapy:  INR 2-3 Monitor platelets by anticoagulation protocol: Yes   Plan:  Stop heparin bridge Recommend warfarin 7.5 mg qday on discharge Follow up INR  Monitor s/sx bleeding   Heloise Ochoa, Pharm.D., BCPS PGY2 Cardiology Pharmacy Resident Pager: [phone] 9:46 AM, 07/05/2015

## 2015-07-05 NOTE — Discharge Summary (Signed)
Physician Discharge Summary  Patient ID: [name] MRN: [id number] DOB/AGE: 1961-05-14 55 y.o.  Admit date: 06/27/2015 Discharge date: 07/05/2015  Admission Diagnoses: Atrial flutter with RVR-CHA2DS2VASc score of 3/9 (now) sinus rhythm Chronic left heart failure  ESRD with hemodialysis, MWF.  DM, II  Gout  Obesity CAD H/O Renal cell cancer S/P right nephrectomy Moderate MR and TR  Discharge Diagnoses:  Principle Problem: * Atrial flutter with rapid ventricular response (HCC) * CHA2DS2VASc score of 3/9 (now) sinus rhythm Chronic left heart systolic failure  ESRD with hemodialysis, MWF.  DM, II  Gout  Obesity CAD H/O Renal cell cancer S/P right nephrectomy Moderate MR and TR  Discharged Condition: fair  Hospital Course: 55 year old male with new onset atrial flutter with rapid ventricular response has weak feeling x 2 days. No chest pain or dizziness. Past medical history of end-stage renal disease on dialysis (M, W, F) type 2 diabetes mellitus, diverticulosis, Left heart systolic failure, EF of [id number] and dilated non-ischemic cardiomyopathy. He was treated with IV amiodarone and converted to sinus rhythm in 24 hours. He was started on warfarin with IV heparin bridge. He became therapeutic INR slowly over 1 week. He was discharged home and have PT/INR in 3 days.   Consults: cardiology  Significant Diagnostic Studies: labs: Near normal CBC, BMET except elevated BUN/Cr. TSH-normal  EKG-Atrial flutter with 2:1 AV block. Subsequent EKG-SR, LVH, IVCD.  Echocardiogram: - Left ventricle: The cavity size was severely dilated. There wasmoderate concentric hypertrophy. Systolic function was severelyreduced. The estimated ejection fraction was in the range of 20%to 25%. Diffuse hypokinesis. - Aortic valve: There was trivial regurgitation. - Mitral valve: There was moderate regurgitation. - Left atrium: The atrium was severely dilated. - Right ventricle: The cavity size was  mildly dilated. Wallthickness was normal. Systolic function was mildly to moderatelyreduced. - Right atrium: The atrium was moderately dilated. - Tricuspid valve: There was moderate regurgitation. - Pulmonary arteries: Systolic pressure was mildly to moderatelyincreased. PA peak pressure: 52 mm Hg (S).  Chest x-ray: Mild cardiomegaly. Mild central venous congestion.  Treatments: cardiac meds: carvedilol, amiodarone and warfarin.  Discharge Exam: Blood pressure 124/86, pulse 71, temperature 97.9 F (36.6 C), temperature source Oral, resp. rate 16, height 6\' 1"  (1.854 m), weight 111.5 kg (245 lb 13 oz), SpO2 98 %. HEENT: [place]/AT, Eyes-Brown, PERL, EOMI, Conjunctiva-Pale pink, Sclera-Non-icteric Neck: No JVD, No bruit, Trachea midline. Lungs: Clear, Bilateral. Cardiac: Regular rhythm, normal S1 and S2, no S3. II/VI systolic murmur. Abdomen: Soft, non-tender. Extremities: No edema present. Left upper extremity AV fistula. No cyanosis. No clubbing. CNS: AxOx3, Cranial nerves grossly intact, moves all 4 extremities. Right handed. Skin: Warm and dry.  Disposition: 01-Home or Self Care     Medication List    STOP taking these medications        aspirin 325 MG tablet     calcium acetate 667 MG capsule  Commonly known as:  PHOSLO     feeding supplement (NEPRO CARB STEADY) Liqd      TAKE these medications        amLODipine 10 MG tablet  Commonly known as:  NORVASC  Take 10 mg by mouth daily.     b complex-vitamin c-folic acid 0.8 MG Tabs tablet  Take 1 tablet by mouth daily with supper.     carvedilol 25 MG tablet  Commonly known as:  COREG  Take 0.5 tablets (12.5 mg total) by mouth See admin instructions. Take 1/2 tablet (12.5 mg) by mouth in  the evening on dialysis (Monday, Wednesday, Friday and 1/2 tablet (12.5 mg) twice daily on Tuesday, Thursday, Saturday, Sunday (non-dialysis days)     cephALEXin 500 MG capsule  Commonly known as:  KEFLEX  Take 1 capsule (500 mg  total) by mouth every 8 (eight) hours.     cinacalcet 90 MG tablet  Commonly known as:  SENSIPAR  Take 90 mg by mouth 4 (four) times a week. Tuesday, Thursday, Saturday, Sunday - non-dialysis days     isosorbide dinitrate 20 MG tablet  Commonly known as:  ISORDIL  Take 20 mg by mouth daily.     lidocaine-prilocaine cream  Commonly known as:  EMLA  Apply 1 application topically every Monday, Wednesday, and Friday. Apply to port access prior to dialysis     nitroGLYCERIN 0.4 MG SL tablet  Commonly known as:  NITROSTAT  Place 0.4 mg under the tongue every 5 (five) minutes as needed for chest pain.     oxyCODONE-acetaminophen 5-325 MG tablet  Commonly known as:  PERCOCET/ROXICET  Take 1 tablet by mouth at bedtime as needed for moderate pain.     pantoprazole 40 MG tablet  Commonly known as:  PROTONIX  Take 1 tablet (40 mg total) by mouth daily at 12 noon.     sevelamer carbonate 800 MG tablet  Commonly known as:  RENVELA  Take 3 tablets (2,400 mg total) by mouth 3 (three) times daily with meals.     VITAMIN D PO  Take 1 tablet by mouth daily.     warfarin 6 MG tablet  Commonly known as:  COUMADIN  Take 1 tablet (6 mg total) by mouth daily at 6 PM.          Amiodarone 200 mg. 1/2 tablet daily.     Follow-up Information    Follow up with Maggie Font, MD In 1 month.   Specialty:  Family Medicine   Contact information:   [address] [place] [address] [phone]       Follow up with [facility], MD In 3 days.   Specialty:  Cardiology   Why:  PT/INR-Walk in [facility] information:   [place] [address] [phone]       Signed: Birdie Riddle 07/05/2015, 9:48 AM

## 2015-07-05 NOTE — Plan of Care (Signed)
Problem: Health Behavior/Discharge Planning: Goal: Ability to safely manage health-related needs after discharge will improve Outcome: Completed/Met Date Met:  07/05/15 Pt educated on new medications including anticoagulation and beta blocker. Pt verbalized understanding.

## 2015-07-15 DIAGNOSIS — N186 End stage renal disease: Secondary | ICD-10-CM | POA: Diagnosis not present

## 2015-07-15 DIAGNOSIS — I2510 Atherosclerotic heart disease of native coronary artery without angina pectoris: Secondary | ICD-10-CM | POA: Diagnosis not present

## 2015-07-15 DIAGNOSIS — I5022 Chronic systolic (congestive) heart failure: Secondary | ICD-10-CM | POA: Diagnosis not present

## 2015-07-15 DIAGNOSIS — I480 Paroxysmal atrial fibrillation: Secondary | ICD-10-CM | POA: Diagnosis not present

## 2015-07-15 DIAGNOSIS — I120 Hypertensive chronic kidney disease with stage 5 chronic kidney disease or end stage renal disease: Secondary | ICD-10-CM | POA: Diagnosis not present

## 2015-07-16 DIAGNOSIS — I4892 Unspecified atrial flutter: Secondary | ICD-10-CM | POA: Diagnosis not present

## 2015-07-22 DIAGNOSIS — I5022 Chronic systolic (congestive) heart failure: Secondary | ICD-10-CM | POA: Diagnosis not present

## 2015-07-22 DIAGNOSIS — Z992 Dependence on renal dialysis: Secondary | ICD-10-CM | POA: Diagnosis not present

## 2015-07-22 DIAGNOSIS — I480 Paroxysmal atrial fibrillation: Secondary | ICD-10-CM | POA: Diagnosis not present

## 2015-07-22 DIAGNOSIS — N186 End stage renal disease: Secondary | ICD-10-CM | POA: Diagnosis not present

## 2015-07-22 DIAGNOSIS — I120 Hypertensive chronic kidney disease with stage 5 chronic kidney disease or end stage renal disease: Secondary | ICD-10-CM | POA: Diagnosis not present

## 2015-07-22 DIAGNOSIS — Z7901 Long term (current) use of anticoagulants: Secondary | ICD-10-CM | POA: Diagnosis not present

## 2015-07-22 DIAGNOSIS — I2510 Atherosclerotic heart disease of native coronary artery without angina pectoris: Secondary | ICD-10-CM | POA: Diagnosis not present

## 2015-07-23 DIAGNOSIS — N186 End stage renal disease: Secondary | ICD-10-CM | POA: Diagnosis not present

## 2015-07-23 DIAGNOSIS — N2581 Secondary hyperparathyroidism of renal origin: Secondary | ICD-10-CM | POA: Diagnosis not present

## 2015-07-23 DIAGNOSIS — E1129 Type 2 diabetes mellitus with other diabetic kidney complication: Secondary | ICD-10-CM | POA: Diagnosis not present

## 2015-08-13 DIAGNOSIS — I4892 Unspecified atrial flutter: Secondary | ICD-10-CM | POA: Diagnosis not present

## 2015-08-22 DIAGNOSIS — I120 Hypertensive chronic kidney disease with stage 5 chronic kidney disease or end stage renal disease: Secondary | ICD-10-CM | POA: Diagnosis not present

## 2015-08-22 DIAGNOSIS — Z992 Dependence on renal dialysis: Secondary | ICD-10-CM | POA: Diagnosis not present

## 2015-08-22 DIAGNOSIS — N186 End stage renal disease: Secondary | ICD-10-CM | POA: Diagnosis not present

## 2015-08-25 DIAGNOSIS — N2581 Secondary hyperparathyroidism of renal origin: Secondary | ICD-10-CM | POA: Diagnosis not present

## 2015-08-25 DIAGNOSIS — N186 End stage renal disease: Secondary | ICD-10-CM | POA: Diagnosis not present

## 2015-08-25 DIAGNOSIS — D631 Anemia in chronic kidney disease: Secondary | ICD-10-CM | POA: Diagnosis not present

## 2015-08-25 DIAGNOSIS — E1129 Type 2 diabetes mellitus with other diabetic kidney complication: Secondary | ICD-10-CM | POA: Diagnosis not present

## 2015-08-26 ENCOUNTER — Encounter (HOSPITAL_COMMUNITY): Payer: Self-pay | Admitting: *Deleted

## 2015-08-26 ENCOUNTER — Ambulatory Visit (INDEPENDENT_AMBULATORY_CARE_PROVIDER_SITE_OTHER): Payer: Medicare Other

## 2015-08-26 ENCOUNTER — Ambulatory Visit (INDEPENDENT_AMBULATORY_CARE_PROVIDER_SITE_OTHER)
Admission: EM | Admit: 2015-08-26 | Discharge: 2015-08-26 | Disposition: A | Discharge status: Home / Self Care | Payer: Medicare Other | Source: Home / Self Care | Attending: Family Medicine | Admitting: Family Medicine

## 2015-08-26 DIAGNOSIS — J069 Acute upper respiratory infection, unspecified: Secondary | ICD-10-CM | POA: Diagnosis not present

## 2015-08-26 DIAGNOSIS — R509 Fever, unspecified: Secondary | ICD-10-CM | POA: Diagnosis not present

## 2015-08-26 DIAGNOSIS — R05 Cough: Secondary | ICD-10-CM | POA: Diagnosis not present

## 2015-08-26 DIAGNOSIS — R0602 Shortness of breath: Secondary | ICD-10-CM | POA: Diagnosis not present

## 2015-08-26 MED ORDER — AZITHROMYCIN 250 MG PO TABS: ORAL_TABLET | ORAL | Status: DC

## 2015-08-26 MED ORDER — DEXTROMETHORPHAN POLISTIREX ER 30 MG/5ML PO SUER: 60.0000 mg | Freq: Two times a day (BID) | ORAL | Status: DC

## 2015-08-26 NOTE — ED Notes (Signed)
Pt  Reports  Symptoms  Of  Cough  /  Congested    With symptoms     X    2  Weeks     Pt  Reports  Feeling  Weak  As   Well         pt  Ambulated    To   Room    Speaking  In  Complete  sentances     Pt   Reports   Symptoms       Worse  On  Exertion  Pt  denys  Being a  Smoker

## 2015-08-26 NOTE — ED Provider Notes (Signed)
CSN: [id number]     Arrival date & time 08/26/15  1938 History   First MD Initiated Contact with Patient 08/26/15 2048     Chief Complaint  Patient presents with  . Cough   (Consider location/radiation/quality/duration/timing/severity/associated sxs/prior Treatment) Patient is a 55 y.o. male presenting with cough. The history is provided by the patient.  Cough Cough characteristics:  Non-productive and dry Severity:  Moderate Onset quality:  Gradual Duration:  2 weeks Progression:  Unchanged Chronicity:  New Smoker: no   Context: upper respiratory infection   Relieved by:  None tried Worsened by:  Nothing tried Ineffective treatments:  None tried Associated symptoms: sinus congestion and wheezing   Associated symptoms: no chest pain, no fever and no shortness of breath   Risk factors: recent infection     Past Medical History  Diagnosis Date  . CHF (congestive heart failure) ([place])   . History of unilateral nephrectomy   . Hypertension   . Cardiomyopathy   . Gout   . Myocardial infarction [facility]) ?2006  . Shortness of breath 05/19/11    "at rest, lying down, w/exertion"  . Umbilical hernia [id number]    unrepaired  . Stroke [facility]) 02/2011    05/19/11 denies residual  . Dialysis patient ([place])     Mon-Wed-Fri  . Diabetes mellitus     pt. states he's borderline diabetic., no longer taking med,- off med. since 2013  . Renal cell carcinoma     dialysis- MWF  . ESRD (end stage renal disease) ([place])   . Coronary artery disease     normal coronaries by 10/10/08 cath  . History of nephrectomy 07/04/2012    History of right nephrectomy in 2002 for renal cell carcinoma   . 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 Renal insufficiency   . Diverticulitis November 2016    reoccurred in December 2016   Past Surgical History  Procedure Laterality Date  . Nephrectomy  2000    right  . Av fistula placement  03/29/2011    Procedure: ARTERIOVENOUS (AV) FISTULA  CREATION;  Surgeon: Hinda Lenis, MD;  Location: [place];  Service: Vascular;  Laterality: Left;  LEFT RADIOCEPHALIC , Arteriovenous [id number])  . Smashed  1990's    "left pinky; have a plate in there"  . US echocardiography  05/20/11  . Cardiac catheterization  05/21/11  . Finger surgery      L pinkie finger- ORIF- /w remaining hardware - 1990's    . Umbilical hernia repair N/A 07/04/2012    Procedure: LAPAROSCOPIC UMBILICAL HERNIA;  Surgeon: Madilyn Hook, DO;  Location: [place];  Service: General;  Laterality: N/A;  . Insertion of mesh N/A 07/04/2012    Procedure: INSERTION OF MESH;  Surgeon: Madilyn Hook, DO;  Location: [place];  Service: General;  Laterality: N/A;  . Hernia repair N/A [id number]    Umbilical hernia repair  . Dialysis cath placed    . [place]ison of arteriovenous fistula Left 06/05/2013    Procedure: REVISON OF LEFT RADIOCEPHALIC  ARTERIOVENOUS FISTULA;  Surgeon: Conrad , MD;  Location: [place];  Service: Vascular;  Laterality: Left;  . Right heart catheterization N/A 05/21/2011    Procedure: RIGHT HEART CATH;  Surgeon: Birdie Riddle, MD;  Location: [facility] CATH LAB;  Service: Cardiovascular;  Laterality: N/A;  . Left and right heart catheterization with coronary angiogram N/A 08/04/2012    Procedure: LEFT AND RIGHT HEART CATHETERIZATION WITH CORONARY ANGIOGRAM;  Surgeon: Birdie Riddle, MD;  Location: [facility] CATH LAB;  Service: Cardiovascular;  Laterality: N/A;   Family History  Problem Relation Age of Onset  . Hypertension Mother   . Kidney disease Mother    Social History  Substance Use Topics  . Smoking status: Never Smoker   . Smokeless tobacco: Never Used  . Alcohol Use: No    Review of Systems  Constitutional: Negative.  Negative for fever.  HENT: Positive for congestion and postnasal drip.   Respiratory: Positive for cough and wheezing. Negative for shortness of breath.   Cardiovascular: Negative for chest pain and palpitations.  All other systems reviewed and  are negative.   Allergies  Ace inhibitors  Home Medications   Prior to Admission medications   Medication Sig Start Date End Date Taking? Authorizing Provider  amiodarone (PACERONE) 200 MG tablet Take 0.5 tablets (100 mg total) by mouth daily. 07/05/15   Dixie Dials, MD  amLODipine (NORVASC) 10 MG tablet Take 10 mg by mouth daily.     Historical Provider, MD  azithromycin (ZITHROMAX Z-PAK) 250 MG tablet Take as directed on pack 08/26/15   Billy Fischer, MD  b complex-vitamin c-folic acid (NEPHRO-VITE) 0.8 MG TABS tablet Take 1 tablet by mouth daily with supper.     Historical Provider, MD  carvedilol (COREG) 25 MG tablet Take 0.5 tablets (12.5 mg total) by mouth See admin instructions. Take 1/2 tablet (12.5 mg) by mouth in the evening on dialysis (Monday, Wednesday, Friday and 1/2 tablet (12.5 mg) twice daily on Tuesday, Thursday, Saturday, Sunday (non-dialysis days) 07/05/15   Dixie Dials, MD  cephALEXin (KEFLEX) 500 MG capsule Take 1 capsule (500 mg total) by mouth daily. 07/05/15   Dixie Dials, MD  Cholecalciferol (VITAMIN D PO) Take 1 tablet by mouth daily.    Historical Provider, MD  cinacalcet (SENSIPAR) 90 MG tablet Take 90 mg by mouth 4 (four) times a week. Tuesday, Thursday, Saturday, Sunday - non-dialysis days    Historical Provider, MD  dextromethorphan (DELSYM) 30 MG/5ML liquid Take 10 mLs (60 mg total) by mouth 2 (two) times daily. 08/26/15   Billy Fischer, MD  isosorbide dinitrate (ISORDIL) 20 MG tablet Take 20 mg by mouth daily.     Historical Provider, MD  lidocaine-prilocaine (EMLA) cream Apply 1 application topically every Monday, Wednesday, and Friday. Apply to port access prior to dialysis 04/08/15   Historical Provider, MD  nitroGLYCERIN (NITROSTAT) 0.4 MG SL tablet Place 0.4 mg under the tongue every 5 (five) minutes as needed for chest pain.  05/20/15   Historical Provider, MD  oxyCODONE-acetaminophen (PERCOCET/ROXICET) 5-325 MG per tablet Take 1 tablet by mouth at bedtime as  needed for moderate pain. Patient taking differently: Take 1 tablet by mouth at bedtime as needed (gout pain).  12/20/14   Maryann Mikhail, DO  pantoprazole (PROTONIX) 40 MG tablet Take 1 tablet (40 mg total) by mouth daily at 12 noon. Patient taking differently: Take 40 mg by mouth daily.  01/16/15   Nishant Dhungel, MD  sevelamer carbonate (RENVELA) 800 MG tablet Take 3 tablets (2,400 mg total) by mouth 3 (three) times daily with meals. 07/05/15   Dixie Dials, MD  warfarin (COUMADIN) 6 MG tablet Take 1 tablet (6 mg total) by mouth daily at 6 PM. 07/05/15   Dixie Dials, MD   Meds Ordered and Administered this Visit  Medications - No data to display  BP 144/88 mmHg  Pulse 83  Temp(Src) 98.9 F (37.2 C) (Oral)  Resp 14  SpO2 100% No data found.   Physical Exam  Constitutional: He is oriented to person, place, and time. He appears well-developed and well-nourished. No distress.  HENT:  Mouth/Throat: Oropharynx is clear and moist.  Neck: Normal range of motion. Neck supple.  Cardiovascular: Normal rate, normal heart sounds and intact distal pulses.   Pulmonary/Chest: Effort normal. He has wheezes. He has rales.  Lymphadenopathy:    He has no cervical adenopathy.  Neurological: He is alert and oriented to person, place, and time.  Skin: Skin is warm and dry.  Nursing note and vitals reviewed.   ED Course  Procedures (including critical care time)  Labs Review Labs Reviewed - No data to display  Imaging Review Dg Chest 2 View  08/26/2015  CLINICAL DATA:  See shortness of breath, fever and cough. EXAM: CHEST - 2 VIEW COMPARISON:  06/27/2015 FINDINGS: Stable moderate cardiac enlargement. Pulmonary venous hypertensive changes present without overt edema. No pleural fluid, focal airspace consolidation or pneumothorax. The visualized skeletal structures are unremarkable. IMPRESSION: Stable cardiomegaly. Pulmonary venous hypertensive changes without overt edema. Electronically Signed   By:  Aletta Edouard M.D.   On: 08/26/2015 21:11   X-rays reviewed and report per radiologist.   Visual Acuity Review  Right Eye Distance:   Left Eye Distance:   Bilateral Distance:    Right Eye Near:   Left Eye Near:    Bilateral Near:         MDM   1. URI (upper respiratory infection)        Billy Fischer, MD 08/27/15 1759

## 2015-09-07 ENCOUNTER — Encounter (HOSPITAL_COMMUNITY): Payer: Self-pay | Admitting: Emergency Medicine

## 2015-09-07 ENCOUNTER — Ambulatory Visit (HOSPITAL_COMMUNITY)
Admission: EM | Admit: 2015-09-07 | Discharge: 2015-09-07 | Disposition: A | Discharge status: Nursing Home (Includes ICF) | Payer: BLUE CROSS/BLUE SHIELD | Attending: Family Medicine | Admitting: Family Medicine

## 2015-09-07 ENCOUNTER — Emergency Department (HOSPITAL_COMMUNITY): Payer: Medicare Other

## 2015-09-07 ENCOUNTER — Encounter (HOSPITAL_COMMUNITY): Payer: Self-pay | Admitting: *Deleted

## 2015-09-07 ENCOUNTER — Emergency Department (HOSPITAL_COMMUNITY)
Admission: EM | Admit: 2015-09-07 | Discharge: 2015-09-08 | Disposition: A | Discharge status: Home / Self Care | Payer: Medicare Other | Attending: Emergency Medicine | Admitting: Emergency Medicine

## 2015-09-07 DIAGNOSIS — Z85528 Personal history of other malignant neoplasm of kidney: Secondary | ICD-10-CM | POA: Insufficient documentation

## 2015-09-07 DIAGNOSIS — H5441 Blindness, right eye, normal vision left eye: Secondary | ICD-10-CM | POA: Insufficient documentation

## 2015-09-07 DIAGNOSIS — R05 Cough: Secondary | ICD-10-CM | POA: Diagnosis present

## 2015-09-07 DIAGNOSIS — I2510 Atherosclerotic heart disease of native coronary artery without angina pectoris: Secondary | ICD-10-CM | POA: Diagnosis not present

## 2015-09-07 DIAGNOSIS — K5792 Diverticulitis of intestine, part unspecified, without perforation or abscess without bleeding: Secondary | ICD-10-CM | POA: Diagnosis not present

## 2015-09-07 DIAGNOSIS — K5732 Diverticulitis of large intestine without perforation or abscess without bleeding: Secondary | ICD-10-CM | POA: Insufficient documentation

## 2015-09-07 DIAGNOSIS — J069 Acute upper respiratory infection, unspecified: Secondary | ICD-10-CM

## 2015-09-07 DIAGNOSIS — R079 Chest pain, unspecified: Secondary | ICD-10-CM | POA: Diagnosis not present

## 2015-09-07 DIAGNOSIS — Z792 Long term (current) use of antibiotics: Secondary | ICD-10-CM | POA: Diagnosis not present

## 2015-09-07 DIAGNOSIS — Z8739 Personal history of other diseases of the musculoskeletal system and connective tissue: Secondary | ICD-10-CM | POA: Diagnosis not present

## 2015-09-07 DIAGNOSIS — Z8673 Personal history of transient ischemic attack (TIA), and cerebral infarction without residual deficits: Secondary | ICD-10-CM | POA: Insufficient documentation

## 2015-09-07 DIAGNOSIS — Z79899 Other long term (current) drug therapy: Secondary | ICD-10-CM | POA: Insufficient documentation

## 2015-09-07 DIAGNOSIS — N186 End stage renal disease: Secondary | ICD-10-CM | POA: Diagnosis not present

## 2015-09-07 DIAGNOSIS — Z992 Dependence on renal dialysis: Secondary | ICD-10-CM | POA: Insufficient documentation

## 2015-09-07 DIAGNOSIS — I252 Old myocardial infarction: Secondary | ICD-10-CM | POA: Insufficient documentation

## 2015-09-07 DIAGNOSIS — I509 Heart failure, unspecified: Secondary | ICD-10-CM | POA: Insufficient documentation

## 2015-09-07 DIAGNOSIS — R109 Unspecified abdominal pain: Secondary | ICD-10-CM | POA: Diagnosis not present

## 2015-09-07 DIAGNOSIS — E119 Type 2 diabetes mellitus without complications: Secondary | ICD-10-CM | POA: Diagnosis not present

## 2015-09-07 DIAGNOSIS — R1012 Left upper quadrant pain: Secondary | ICD-10-CM

## 2015-09-07 DIAGNOSIS — I120 Hypertensive chronic kidney disease with stage 5 chronic kidney disease or end stage renal disease: Secondary | ICD-10-CM | POA: Diagnosis not present

## 2015-09-07 DIAGNOSIS — Z9889 Other specified postprocedural states: Secondary | ICD-10-CM | POA: Diagnosis not present

## 2015-09-07 DIAGNOSIS — Z7901 Long term (current) use of anticoagulants: Secondary | ICD-10-CM | POA: Insufficient documentation

## 2015-09-07 DIAGNOSIS — R0602 Shortness of breath: Secondary | ICD-10-CM | POA: Diagnosis not present

## 2015-09-07 LAB — CBC
HCT: 37.6 % — ABNORMAL LOW (ref 39.0–52.0)
Hemoglobin: 12 g/dL — ABNORMAL LOW (ref 13.0–17.0)
MCH: 30.6 pg (ref 26.0–34.0)
MCHC: 31.9 g/dL (ref 30.0–36.0)
MCV: 95.9 fL (ref 78.0–100.0)
Platelets: 139 10*3/uL — ABNORMAL LOW (ref 150–400)
RBC: 3.92 MIL/uL — ABNORMAL LOW (ref 4.22–5.81)
RDW: 16.4 % — ABNORMAL HIGH (ref 11.5–15.5)
WBC: 4 10*3/uL (ref 4.0–10.5)

## 2015-09-07 LAB — I-STAT TROPONIN, ED: Troponin i, poc: 0.06 ng/mL (ref 0.00–0.08)

## 2015-09-07 LAB — LIPASE, BLOOD: Lipase: 25 U/L (ref 11–51)

## 2015-09-07 LAB — COMPREHENSIVE METABOLIC PANEL
ALT: 21 U/L (ref 17–63)
AST: 21 U/L (ref 15–41)
Albumin: 3.2 g/dL — ABNORMAL LOW (ref 3.5–5.0)
Alkaline Phosphatase: 85 U/L (ref 38–126)
Anion gap: 14 (ref 5–15)
BUN: 39 mg/dL — ABNORMAL HIGH (ref 6–20)
CO2: 23 mmol/L (ref 22–32)
Calcium: 8.5 mg/dL — ABNORMAL LOW (ref 8.9–10.3)
Chloride: 99 mmol/L — ABNORMAL LOW (ref 101–111)
Creatinine, Ser: 11.93 mg/dL — ABNORMAL HIGH (ref 0.61–1.24)
GFR calc Af Amer: 5 mL/min — ABNORMAL LOW (ref >60)
GFR calc non Af Amer: 4 mL/min — ABNORMAL LOW (ref >60)
Glucose, Bld: 86 mg/dL (ref 65–99)
Potassium: 5.3 mmol/L — ABNORMAL HIGH (ref 3.5–5.1)
Sodium: 136 mmol/L (ref 135–145)
Total Bilirubin: 1 mg/dL (ref 0.3–1.2)
Total Protein: 6.8 g/dL (ref 6.5–8.1)

## 2015-09-07 MED ORDER — IOPAMIDOL (ISOVUE-300) INJECTION 61%
INTRAVENOUS | Status: AC
  Administered 2015-09-07: 100 mL
  Filled 2015-09-07: qty 100

## 2015-09-07 MED ORDER — METRONIDAZOLE IN NACL 5-0.79 MG/ML-% IV SOLN
500.0000 mg | Freq: Once | INTRAVENOUS | Status: AC
  Administered 2015-09-07: 500 mg via INTRAVENOUS
  Filled 2015-09-07: qty 100

## 2015-09-07 MED ORDER — ALBUTEROL SULFATE (2.5 MG/3ML) 0.083% IN NEBU
5.0000 mg | INHALATION_SOLUTION | Freq: Once | RESPIRATORY_TRACT | Status: AC
  Administered 2015-09-07: 5 mg via RESPIRATORY_TRACT
  Filled 2015-09-07: qty 6

## 2015-09-07 MED ORDER — IPRATROPIUM BROMIDE 0.02 % IN SOLN
0.5000 mg | Freq: Once | RESPIRATORY_TRACT | Status: AC
  Administered 2015-09-07: 0.5 mg via RESPIRATORY_TRACT
  Filled 2015-09-07: qty 2.5

## 2015-09-07 MED ORDER — CIPROFLOXACIN IN D5W 400 MG/200ML IV SOLN
400.0000 mg | Freq: Once | INTRAVENOUS | Status: AC
  Administered 2015-09-07: 400 mg via INTRAVENOUS
  Filled 2015-09-07: qty 200

## 2015-09-07 MED ORDER — AEROCHAMBER PLUS W/MASK MISC
1.0000 | Freq: Once | Status: AC
  Administered 2015-09-08: 1
  Filled 2015-09-07: qty 1

## 2015-09-07 MED ORDER — ALBUTEROL SULFATE HFA 108 (90 BASE) MCG/ACT IN AERS
2.0000 | INHALATION_SPRAY | RESPIRATORY_TRACT | Status: DC | PRN
  Administered 2015-09-08: 2 via RESPIRATORY_TRACT
  Filled 2015-09-07: qty 6.7

## 2015-09-07 NOTE — ED Notes (Signed)
Patient transported to CT 

## 2015-09-07 NOTE — ED Notes (Signed)
Dr. Louis Meckel at the bedside.

## 2015-09-07 NOTE — ED Provider Notes (Signed)
CSN: [id number]     Arrival date & time 09/07/15  1415 History   First MD Initiated Contact with Patient 09/07/15 1605     Chief Complaint  Patient presents with  . Chest Pain  . Cough     (Consider location/radiation/quality/duration/timing/severity/associated sxs/prior Treatment) HPI Comments: 55 year old male with history of end-stage renal disease on dialysis Monday Wednesday Friday with his last dialysis on Friday, CHF, hypertension, CAD presents for cough. The patient reports that he has had a cough for the last month. He said he was seen at an urgent care and was diagnosed with an upper respiratory infection. He said that he's continued to cough despite a cough medication that they prescribed for him.  He says that the cough is worse at night and wakes him up. He said he has had nasal congestion with this as well and initially had a runny nose and itchy eyes. He says at times he coughs so hard that he has almost thrown up and that it gives him pain in his sides. Denies any fevers or chills. He said he was also seen for this a couple weeks ago and prescribed an antibiotic.  Patient is a 55 y.o. male presenting with chest pain and cough.  Chest Pain Associated symptoms: abdominal pain, cough and vomiting   Associated symptoms: no back pain, no fatigue, no fever, no nausea and no palpitations   Cough Associated symptoms: chest pain   Associated symptoms: no chills, no fever, no myalgias, no rash and no rhinorrhea     Past Medical History  Diagnosis Date  . CHF (congestive heart failure) ([place])   . History of unilateral nephrectomy   . Hypertension   . Cardiomyopathy   . Gout   . Myocardial infarction [facility]) ?2006  . Shortness of breath 05/19/11    "at rest, lying down, w/exertion"  . Umbilical hernia [id number]    unrepaired  . Stroke [facility]) 02/2011    05/19/11 denies residual  . Dialysis patient ([place])     Mon-Wed-Fri  . Diabetes mellitus     pt. states he's borderline  diabetic., no longer taking med,- off med. since 2013  . Renal cell carcinoma     dialysis- MWF  . ESRD (end stage renal disease) ([place])   . Coronary artery disease     normal coronaries by 10/10/08 cath  . History of nephrectomy 07/04/2012    History of right nephrectomy in 2002 for renal cell carcinoma   . 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 Renal insufficiency   . Diverticulitis November 2016    reoccurred in December 2016   Past Surgical History  Procedure Laterality Date  . Nephrectomy  2000    right  . Av fistula placement  03/29/2011    Procedure: ARTERIOVENOUS (AV) FISTULA CREATION;  Surgeon: Hinda Lenis, MD;  Location: [place];  Service: Vascular;  Laterality: Left;  LEFT RADIOCEPHALIC , Arteriovenous [id number])  . Smashed  1990's    "left pinky; have a plate in there"  . US echocardiography  05/20/11  . Cardiac catheterization  05/21/11  . Finger surgery      L pinkie finger- ORIF- /w remaining hardware - 1990's    . Umbilical hernia repair N/A 07/04/2012    Procedure: LAPAROSCOPIC UMBILICAL HERNIA;  Surgeon: Madilyn Hook, DO;  Location: [place];  Service: General;  Laterality: N/A;  . Insertion of mesh N/A 07/04/2012    Procedure: INSERTION  OF MESH;  Surgeon: Madilyn Hook, DO;  Location: [place];  Service: General;  Laterality: N/A;  . Hernia repair N/A [id number]    Umbilical hernia repair  . Dialysis cath placed    . Revison of arteriovenous fistula Left 06/05/2013    Procedure: REVISON OF LEFT RADIOCEPHALIC  ARTERIOVENOUS FISTULA;  Surgeon: Conrad [place], MD;  Location: [place];  Service: Vascular;  Laterality: Left;  . Right heart catheterization N/A 05/21/2011    Procedure: RIGHT HEART CATH;  Surgeon: Birdie Riddle, MD;  Location: [facility] C[facility] LAB;  Service: Cardiovascular;  Laterality: N/A;  . Left and right heart catheterization with coronary angiogram N/A 08/04/2012    Procedure: LEFT AND RIGHT HEART CATHETERIZATION WITH CORONARY ANGIOGRAM;   Surgeon: Birdie Riddle, MD;  Location: [place] CATH LAB;  Service: Cardiovascular;  Laterality: N/A;   Family History  Problem Relation Age of Onset  . Hypertension Mother   . Kidney disease Mother    Social History  Substance Use Topics  . Smoking status: Never Smoker   . Smokeless tobacco: Never Used  . Alcohol Use: No    Review of Systems  Constitutional: Negative for fever, chills and fatigue.  HENT: Negative for congestion, postnasal drip, rhinorrhea and sneezing.   Respiratory: Positive for cough.   Cardiovascular: Positive for chest pain. Negative for palpitations and leg swelling.  Gastrointestinal: Positive for vomiting and abdominal pain. Negative for nausea and diarrhea.  Genitourinary: Negative for dysuria, urgency and hematuria.  Musculoskeletal: Negative for myalgias and back pain.  Skin: Negative for rash.  Hematological: Does not bruise/bleed easily.      Allergies  Ace inhibitors  Home Medications   Prior to Admission medications   Medication Sig Start Date End Date Taking? Authorizing Provider  amiodarone (PACERONE) 200 MG tablet Take 0.5 tablets (100 mg total) by mouth daily. 07/05/15  Yes Dixie Dials, MD  amLODipine (NORVASC) 10 MG tablet Take 10 mg by mouth daily.    Yes Historical Provider, MD  b complex-vitamin c-folic acid (NEPHRO-VITE) 0.8 MG TABS tablet Take 1 tablet by mouth daily with supper.    Yes Historical Provider, MD  carvedilol (COREG) 25 MG tablet Take 0.5 tablets (12.5 mg total) by mouth See admin instructions. Take 1/2 tablet (12.5 mg) by mouth in the evening on dialysis (Monday, Wednesday, Friday and 1/2 tablet (12.5 mg) twice daily on Tuesday, Thursday, Saturday, Sunday (non-dialysis days) 07/05/15  Yes Dixie Dials, MD  Cholecalciferol (VITAMIN D PO) Take 1 tablet by mouth daily.   Yes Historical Provider, MD  cinacalcet (SENSIPAR) 90 MG tablet Take 90 mg by mouth 4 (four) times a week. Tuesday, Thursday, Saturday, Sunday - non-dialysis days    Yes Historical Provider, MD  isosorbide dinitrate (ISORDIL) 20 MG tablet Take 20 mg by mouth daily.    Yes Historical Provider, MD  lidocaine-prilocaine (EMLA) cream Apply 1 application topically every Monday, Wednesday, and Friday. Apply to port access prior to dialysis 04/08/15  Yes Historical Provider, MD  nitroGLYCERIN (NITROSTAT) 0.4 MG SL tablet Place 0.4 mg under the tongue every 5 (five) minutes as needed for chest pain.  05/20/15  Yes Historical Provider, MD  pantoprazole (PROTONIX) 40 MG tablet Take 1 tablet (40 mg total) by mouth daily at 12 noon. Patient taking differently: Take 40 mg by mouth daily.  01/16/15  Yes Nishant Dhungel, MD  sevelamer carbonate (RENVELA) 800 MG tablet Take 3 tablets (2,400 mg total) by mouth 3 (three) times daily with meals. 07/05/15  Yes  Dixie Dials, MD  warfarin (COUMADIN) 6 MG tablet Take 1 tablet (6 mg total) by mouth daily at 6 PM. Patient taking differently: Take 3-6 mg by mouth daily at 6 PM. alternating 3 and 6mg  daily 07/05/15  Yes Dixie Dials, MD  azithromycin (ZITHROMAX Z-PAK) 250 MG tablet Take as directed on pack 08/26/15   Billy Fischer, MD  cephALEXin (KEFLEX) 500 MG capsule Take 1 capsule (500 mg total) by mouth daily. 07/05/15   Dixie Dials, MD  dextromethorphan (DELSYM) 30 MG/5ML liquid Take 10 mLs (60 mg total) by mouth 2 (two) times daily. 08/26/15   Billy Fischer, MD  oxyCODONE-acetaminophen (PERCOCET/ROXICET) 5-325 MG per tablet Take 1 tablet by mouth at bedtime as needed for moderate pain. Patient taking differently: Take 1 tablet by mouth at bedtime as needed (gout pain).  12/20/14   Maryann Mikhail, DO   BP 132/92 mmHg  Pulse 75  Temp(Src) 97.7 F (36.5 C) (Oral)  Resp 23  Ht 6\' 1"  (1.854 m)  Wt 244 lb (110.678 kg)  BMI 32.20 kg/m2  SpO2 99% Physical Exam  Constitutional: He is oriented to person, place, and time. He appears well-developed and well-nourished. No distress.  HENT:  Head: Normocephalic and atraumatic.  Right Ear:  External ear normal.  Left Ear: External ear normal.  Mouth/Throat: Oropharynx is clear and moist. No oropharyngeal exudate.  Eyes: EOM are normal. Pupils are equal, round, and reactive to light.  Neck: Normal range of motion. Neck supple.  Cardiovascular: Normal rate, regular rhythm, normal heart sounds and intact distal pulses.   No murmur heard. Pulmonary/Chest: Effort normal. No respiratory distress. He has wheezes (scattered, bilateral). He has no rales.  Abdominal: Soft. He exhibits no distension. There is tenderness (mild LLQ).  Musculoskeletal: He exhibits no edema.  Neurological: He is alert and oriented to person, place, and time.  Skin: Skin is warm and dry. No rash noted. He is not diaphoretic.  Vitals reviewed.   ED Course  Procedures (including critical care time) Labs Review Labs Reviewed  COMPREHENSIVE METABOLIC PANEL - Abnormal; Notable for the following:    Potassium 5.3 (*)    Chloride 99 (*)    BUN 39 (*)    Creatinine, Ser 11.93 (*)    Calcium 8.5 (*)    Albumin 3.2 (*)    GFR calc non Af Amer 4 (*)    GFR calc Af Amer 5 (*)    All other components within normal limits  CBC - Abnormal; Notable for the following:    RBC 3.92 (*)    Hemoglobin 12.0 (*)    HCT 37.6 (*)    RDW 16.4 (*)    Platelets 139 (*)    All other components within normal limits  LIPASE, BLOOD  I-STAT TROPOININ, ED    Imaging Review Dg Chest 2 View  09/07/2015  CLINICAL DATA:  Chills, shortness of breath and intermittent left side chest tightness. EXAM: CHEST  2 VIEW COMPARISON:  Chest radiograph 08/26/2015 FINDINGS: Stable cardiomegaly and enlargement of the pulmonary arteries bilaterally. Pulmonary vascular redistribution. Probable mild interstitial pulmonary edema. Heterogeneous opacities left lung base. No pleural effusion or pneumothorax. IMPRESSION: Cardiomegaly and pulmonary vascular redistribution with mild interstitial pulmonary edema. Left basilar opacities favored to  represent atelectasis. Electronically Signed   By: Lovey Newcomer M.D.   On: 09/07/2015 18:25   I have personally reviewed and evaluated these images and lab results as part of my medical decision-making.   EKG Interpretation  Date/Time:  Sunday September 07 2015 14:27:25 EDT Ventricular Rate:  79 PR Interval:  192 QRS Duration: 114 QT Interval:  446 QTC Calculation: 511 R Axis:   -11 Text Interpretation:  Normal sinus rhythm Biatrial enlargement ST \\T \ T  wave abnormality, consider lateral ischemia Prolonged QT Abnormal ECG  Confirmed by Hazle Coca [phone]) on 09/07/2015 2:44:20 PM      MDM  Patient was seen and evaluated in stable condition. Mild wheezing on examination. We'll give the patient breathing treatment and obtain a chest x-ray.  7:30 PM Patient was reevaluated. Breath sounds greatly improved. Felt significantly better after breathing treatments. On my reevaluation his wife is now present and reported that the patient has been vomiting at home and has been complaining of left-sided abdominal pain like when he's had diverticulitis in the past. The patient reports that this is true. For this reason CT abdomen and pelvis was added to his workup.  CT consistent with him, consider diverticulitis. Patient tolerating by mouth. Patient was given his first dose of Cipro and Flagyl in the emergency department. He was discharged home with strict return precautions and prescriptions for Cipro, Flagyl, albuterol, Robitussin-DM. He was instructed to follow up outpatient to keep his appointment for dialysis in the morning.  Final diagnoses:  None    1. URI 2. Diverticulitis    Harvel Quale, MD 09/08/15 [phone]

## 2015-09-07 NOTE — ED Provider Notes (Signed)
CSN: [id number]     Arrival date & time 09/07/15  1259 History   First MD Initiated Contact with Patient 09/07/15 1309     Chief Complaint  Patient presents with  . Cough   (Consider location/radiation/quality/duration/timing/severity/associated sxs/prior Treatment) Patient is a 55 y.o. male presenting with cough. The history is provided by the patient.  Cough Cough characteristics:  Non-productive and dry Severity:  Moderate Onset quality:  Gradual Duration:  2 weeks Progression:  Worsening Chronicity:  Recurrent Smoker: no   Context: upper respiratory infection   Context comment:  Seen 4/4 with neg cxr and felt to have a URI synd, now with persistent cough problem but also n/v/d and luq abd pain c/w flare of diverticulitis. Associated symptoms: no fever     Past Medical History  Diagnosis Date  . CHF (congestive heart failure) ([place])   . History of unilateral nephrectomy   . Hypertension   . Cardiomyopathy   . Gout   . Myocardial infarction [facility]) ?2006  . Shortness of breath 05/19/11    "at rest, lying down, w/exertion"  . Umbilical hernia [id number]    unrepaired  . Stroke [facility]) 02/2011    05/19/11 denies residual  . Dialysis patient ([place])     Mon-Wed-Fri  . Diabetes mellitus     pt. states he's borderline diabetic., no longer taking med,- off med. since 2013  . Renal cell carcinoma     dialysis- MWF  . ESRD (end stage renal disease) ([place])   . Coronary artery disease     normal coronaries by 10/10/08 cath  . History of nephrectomy 07/04/2012    History of right nephrectomy in 2002 for renal cell carcinoma   . 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 Renal insufficiency   . Diverticulitis November 2016    reoccurred in December 2016   Past Surgical History  Procedure Laterality Date  . Nephrectomy  2000    right  . Av fistula placement  03/29/2011    Procedure: ARTERIOVENOUS (AV) FISTULA CREATION;  Surgeon: Hinda Lenis, MD;  Location:  [place];  Service: Vascular;  Laterality: Left;  LEFT RADIOCEPHALIC , Arteriovenous [id number])  . Smashed  1990's    "left pinky; have a plate in there"  . US echocardiography  05/20/11  . Cardiac catheterization  05/21/11  . Finger surgery      L pinkie finger- ORIF- /w remaining hardware - 1990's    . Umbilical hernia repair N/A 07/04/2012    Procedure: LAPAROSCOPIC UMBILICAL HERNIA;  Surgeon: Madilyn Hook, DO;  Location: [place];  Service: General;  Laterality: N/A;  . Insertion of mesh N/A 07/04/2012    Procedure: INSERTION OF MESH;  Surgeon: Madilyn Hook, DO;  Location: [place];  Service: General;  Laterality: N/A;  . Hernia repair N/A [id number]    Umbilical hernia repair  . Dialysis cath placed    . Revison of arteriovenous fistula Left 06/05/2013    Procedure: REVISON OF LEFT RADIOCEPHALIC  ARTERIOVENOUS FISTULA;  Surgeon: Conrad [place], MD;  Location: [place];  Service: Vascular;  Laterality: Left;  . Right heart catheterization N/A 05/21/2011    Procedure: RIGHT HEART CATH;  Surgeon: Birdie Riddle, MD;  Location: [facility] CATH LAB;  Service: Cardiovascular;  Laterality: N/A;  . Left and right heart catheterization with coronary angiogram N/A 08/04/2012    Procedure: LEFT AND RIGHT HEART CATHETERIZATION WITH CORONARY ANGIOGRAM;  Surgeon: Birdie Riddle, MD;  Location: [place] CATH LAB;  Service: Cardiovascular;  Laterality: N/A;   Family History  Problem Relation Age of Onset  . Hypertension Mother   . Kidney disease Mother    Social History  Substance Use Topics  . Smoking status: Never Smoker   . Smokeless tobacco: Never Used  . Alcohol Use: No    Review of Systems  Constitutional: Positive for appetite change. Negative for fever.  Respiratory: Positive for cough.   Gastrointestinal: Positive for nausea, vomiting, abdominal pain and diarrhea. Negative for blood in stool.  Genitourinary: Negative.   All other systems reviewed and are negative.   Allergies  Ace inhibitors  Home  Medications   Prior to Admission medications   Medication Sig Start Date End Date Taking? Authorizing Provider  amiodarone (PACERONE) 200 MG tablet Take 0.5 tablets (100 mg total) by mouth daily. 07/05/15   Dixie Dials, MD  amLODipine (NORVASC) 10 MG tablet Take 10 mg by mouth daily.     Historical Provider, MD  azithromycin (ZITHROMAX Z-PAK) 250 MG tablet Take as directed on pack 08/26/15   Billy Fischer, MD  b complex-vitamin c-folic acid (NEPHRO-VITE) 0.8 MG TABS tablet Take 1 tablet by mouth daily with supper.     Historical Provider, MD  carvedilol (COREG) 25 MG tablet Take 0.5 tablets (12.5 mg total) by mouth See admin instructions. Take 1/2 tablet (12.5 mg) by mouth in the evening on dialysis (Monday, Wednesday, Friday and 1/2 tablet (12.5 mg) twice daily on Tuesday, Thursday, Saturday, Sunday (non-dialysis days) 07/05/15   Dixie Dials, MD  cephALEXin (KEFLEX) 500 MG capsule Take 1 capsule (500 mg total) by mouth daily. 07/05/15   Dixie Dials, MD  Cholecalciferol (VITAMIN D PO) Take 1 tablet by mouth daily.    Historical Provider, MD  cinacalcet (SENSIPAR) 90 MG tablet Take 90 mg by mouth 4 (four) times a week. Tuesday, Thursday, Saturday, Sunday - non-dialysis days    Historical Provider, MD  dextromethorphan (DELSYM) 30 MG/5ML liquid Take 10 mLs (60 mg total) by mouth 2 (two) times daily. 08/26/15   Billy Fischer, MD  isosorbide dinitrate (ISORDIL) 20 MG tablet Take 20 mg by mouth daily.     Historical Provider, MD  lidocaine-prilocaine (EMLA) cream Apply 1 application topically every Monday, Wednesday, and Friday. Apply to port access prior to dialysis 04/08/15   Historical Provider, MD  nitroGLYCERIN (NITROSTAT) 0.4 MG SL tablet Place 0.4 mg under the tongue every 5 (five) minutes as needed for chest pain.  05/20/15   Historical Provider, MD  oxyCODONE-acetaminophen (PERCOCET/ROXICET) 5-325 MG per tablet Take 1 tablet by mouth at bedtime as needed for moderate pain. Patient taking  differently: Take 1 tablet by mouth at bedtime as needed (gout pain).  12/20/14   Maryann Mikhail, DO  pantoprazole (PROTONIX) 40 MG tablet Take 1 tablet (40 mg total) by mouth daily at 12 noon. Patient taking differently: Take 40 mg by mouth daily.  01/16/15   Nishant Dhungel, MD  sevelamer carbonate (RENVELA) 800 MG tablet Take 3 tablets (2,400 mg total) by mouth 3 (three) times daily with meals. 07/05/15   Dixie Dials, MD  warfarin (COUMADIN) 6 MG tablet Take 1 tablet (6 mg total) by mouth daily at 6 PM. 07/05/15   Dixie Dials, MD   Meds Ordered and Administered this Visit  Medications - No data to display  BP 126/83 mmHg  Pulse 83  Temp(Src) 97.5 F (36.4 C) (Oral)  Resp 20  SpO2 98% No data found.  Physical Exam  Constitutional: He is oriented to person, place, and time. He appears well-developed and well-nourished. No distress.  HENT:  Mouth/Throat: Oropharynx is clear and moist.  Eyes: Pupils are equal, round, and reactive to light.  Neck: Normal range of motion. Neck supple.  Cardiovascular: Normal rate, normal heart sounds and intact distal pulses.   Pulmonary/Chest: Effort normal. He has wheezes.  Abdominal: Soft. Normal appearance. He exhibits no distension and no mass. Bowel sounds are decreased. There is no hepatosplenomegaly. There is tenderness in the left upper quadrant. There is no rigidity, no rebound, no guarding, no CVA tenderness, no tenderness at McBurney's point and negative Murphy's sign.    Musculoskeletal: Normal range of motion.  Neurological: He is alert and oriented to person, place, and time.  Skin: Skin is warm and dry.  Nursing note and vitals reviewed.   ED Course  Procedures (including critical care time)  Labs Review Labs Reviewed - No data to display  Imaging Review No results found.   Visual Acuity Review  Right Eye Distance:   Left Eye Distance:   Bilateral Distance:    Right Eye Near:   Left Eye Near:    Bilateral Near:          MDM   1. Left upper quadrant pain   2. URI (upper respiratory infection)    Sent for persistent uri sx of wheezing and cough with luq pain c/w flare of divert.    Billy Fischer, MD 09/07/15 [phone]

## 2015-09-07 NOTE — ED Notes (Signed)
Pt  Reports  Symptoms  Of  Cough   Congested     Was  Seen  sev  Weeks  Ago    For  Same         Pt  Reports   Some  Chills  As  Well  As  Diarrhea   And  Phlegm         Pt  Reports     Some  abd  Pain           History  Of  Diverticulitis       pt  repors  Symptoms  Of  Wheezing  As  Well      Pt  Is  Sitting upright on  The  Exam table  Speaking in  Complete  sentances

## 2015-09-07 NOTE — ED Notes (Signed)
Missed iv attempt. 2nd rn at bedside for attempt.

## 2015-09-07 NOTE — ED Notes (Signed)
Pt sent from urgent care for further eval of chest discomfort, cough and left sided abdominal pain. Pt reports symptoms ongoing for about 1 month. Pt seen at urgent care in the beginning of the month with possible URI.

## 2015-09-07 NOTE — ED Notes (Signed)
Pt states that he does not make  Urine - UA dc'd.

## 2015-09-08 DIAGNOSIS — J069 Acute upper respiratory infection, unspecified: Secondary | ICD-10-CM | POA: Diagnosis not present

## 2015-09-08 MED ORDER — GUAIFENESIN-DM 100-10 MG/5ML PO SYRP: 5.0000 mL | ORAL_SOLUTION | ORAL | Status: DC | PRN

## 2015-09-08 MED ORDER — CIPROFLOXACIN HCL 500 MG PO TABS: 500.0000 mg | ORAL_TABLET | Freq: Two times a day (BID) | ORAL | Status: DC

## 2015-09-08 MED ORDER — METRONIDAZOLE 500 MG PO TABS: 500.0000 mg | ORAL_TABLET | Freq: Three times a day (TID) | ORAL | Status: DC

## 2015-09-08 NOTE — ED Notes (Signed)
Went to discharge patient, he requests cough syrup. Discussed previous MD has provided discharge prescriptions, and new provider has not seen patient.

## 2015-09-08 NOTE — Discharge Instructions (Signed)
You were seen and evaluated today for your cough. This appears to be secondary to an upper respiratory infection. Use the inhaler provided to help control symptoms. Follow-up with her primary care physician for reevaluation. You're also evaluated for your abdominal pain. This appears to be secondary to diverticulitis. Take the antibiotics prescribed and again follow-up with her primary care physician for reevaluation. Return with inability to take your antibiotics at home, fever, increased abdominal pain. Make sure you keep your appointment at the dialysis unit tomorrow morning.  Diverticulitis Diverticulitis is inflammation or infection of small pouches in your colon that form when you have a condition called diverticulosis. The pouches in your colon are called diverticula. Your colon, or large intestine, is where water is absorbed and stool is formed. Complications of diverticulitis can include:  Bleeding.  Severe infection.  Severe pain.  Perforation of your colon.  Obstruction of your colon. CAUSES  Diverticulitis is caused by bacteria. Diverticulitis happens when stool becomes trapped in diverticula. This allows bacteria to grow in the diverticula, which can lead to inflammation and infection. RISK FACTORS People with diverticulosis are at risk for diverticulitis. Eating a diet that does not include enough fiber from fruits and vegetables may make diverticulitis more likely to develop. SYMPTOMS  Symptoms of diverticulitis may include:  Abdominal pain and tenderness. The pain is normally located on the left side of the abdomen, but may occur in other areas.  Fever and chills.  Bloating.  Cramping.  Nausea.  Vomiting.  Constipation.  Diarrhea.  Blood in your stool. DIAGNOSIS  Your health care provider will ask you about your medical history and do a physical exam. You may need to have tests done because many medical conditions can cause the same symptoms as diverticulitis.  Tests may include:  Blood tests.  Urine tests.  Imaging tests of the abdomen, including X-rays and CT scans. When your condition is under control, your health care provider may recommend that you have a colonoscopy. A colonoscopy can show how severe your diverticula are and whether something else is causing your symptoms. TREATMENT  Most cases of diverticulitis are mild and can be treated at home. Treatment may include:  Taking over-the-counter pain medicines.  Following a clear liquid diet.  Taking antibiotic medicines by mouth for 7-10 days. More severe cases may be treated at a hospital. Treatment may include:  Not eating or drinking.  Taking prescription pain medicine.  Receiving antibiotic medicines through an IV tube.  Receiving fluids and nutrition through an IV tube.  Surgery. HOME CARE INSTRUCTIONS   Follow your health care provider's instructions carefully.  Follow a full liquid diet or other diet as directed by your health care provider. After your symptoms improve, your health care provider may tell you to change your diet. He or she may recommend you eat a high-fiber diet. Fruits and vegetables are good sources of fiber. Fiber makes it easier to pass stool.  Take fiber supplements or probiotics as directed by your health care provider.  Only take medicines as directed by your health care provider.  Keep all your follow-up appointments. SEEK MEDICAL CARE IF:   Your pain does not improve.  You have a hard time eating food.  Your bowel movements do not return to normal. SEEK IMMEDIATE MEDICAL CARE IF:   Your pain becomes worse.  Your symptoms do not get better.  Your symptoms suddenly get worse.  You have a fever.  You have repeated vomiting.  You have bloody or  black, tarry stools. MAKE SURE YOU:   Understand these instructions.  Will watch your condition.  Will get help right away if you are not doing well or get worse.   This information  is not intended to replace advice given to you by your health care provider. Make sure you discuss any questions you have with your health care provider.   Document Released: 02/17/2005 Document Revised: 05/15/2013 Document Reviewed: 04/04/2013 Elsevier Interactive Patient Education 2016 [employer]   Upper Respiratory Infection, Adult Most upper respiratory infections (URIs) are a viral infection of the air passages leading to the lungs. A URI affects the nose, throat, and upper air passages. The most common type of URI is nasopharyngitis and is typically referred to as "the common cold." URIs run their course and usually go away on their own. Most of the time, a URI does not require medical attention, but sometimes a bacterial infection in the upper airways can follow a viral infection. This is called a secondary infection. Sinus and middle ear infections are common types of secondary upper respiratory infections. Bacterial pneumonia can also complicate a URI. A URI can worsen asthma and chronic obstructive pulmonary disease (COPD). Sometimes, these complications can require emergency medical care and may be life threatening.  CAUSES Almost all URIs are caused by viruses. A virus is a type of germ and can spread from one person to another.  RISKS FACTORS You may be at risk for a URI if:   You smoke.   You have chronic heart or lung disease.  You have a weakened defense (immune) system.   You are very young or very old.   You have nasal allergies or asthma.  You work in crowded or poorly ventilated areas.  You work in health care facilities or schools. SIGNS AND SYMPTOMS  Symptoms typically develop 2-3 days after you come in contact with a cold virus. Most viral URIs last 7-10 days. However, viral URIs from the influenza virus (flu virus) can last 14-18 days and are typically more severe. Symptoms may include:   Runny or stuffy (congested) nose.   Sneezing.   Cough.    Sore throat.   Headache.   Fatigue.   Fever.   Loss of appetite.   Pain in your forehead, behind your eyes, and over your cheekbones (sinus pain).  Muscle aches.  DIAGNOSIS  Your health care provider may diagnose a URI by:  Physical exam.  Tests to check that your symptoms are not due to another condition such as:  Strep throat.  Sinusitis.  Pneumonia.  Asthma. TREATMENT  A URI goes away on its own with time. It cannot be cured with medicines, but medicines may be prescribed or recommended to relieve symptoms. Medicines may help:  Reduce your fever.  Reduce your cough.  Relieve nasal congestion. HOME CARE INSTRUCTIONS   Take medicines only as directed by your health care provider.   Gargle warm saltwater or take cough drops to comfort your throat as directed by your health care provider.  Use a warm mist humidifier or inhale steam from a shower to increase air moisture. This may make it easier to breathe.  Drink enough fluid to keep your urine clear or pale yellow.   Eat soups and other clear broths and maintain good nutrition.   Rest as needed.   Return to work when your temperature has returned to normal or as your health care provider advises. You may need to stay home longer to avoid  infecting others. You can also use a face mask and careful hand washing to prevent spread of the virus.  Increase the usage of your inhaler if you have asthma.   Do not use any tobacco products, including cigarettes, chewing tobacco, or electronic cigarettes. If you need help quitting, ask your health care provider. PREVENTION  The best way to protect yourself from getting a cold is to practice good hygiene.   Avoid oral or hand contact with people with cold symptoms.   Wash your hands often if contact occurs.  There is no clear evidence that vitamin C, vitamin E, echinacea, or exercise reduces the chance of developing a cold. However, it is always  recommended to get plenty of rest, exercise, and practice good nutrition.  SEEK MEDICAL CARE IF:   You are getting worse rather than better.   Your symptoms are not controlled by medicine.   You have chills.  You have worsening shortness of breath.  You have brown or red mucus.  You have yellow or brown nasal discharge.  You have pain in your face, especially when you bend forward.  You have a fever.  You have swollen neck glands.  You have pain while swallowing.  You have white areas in the back of your throat. SEEK IMMEDIATE MEDICAL CARE IF:   You have severe or persistent:  Headache.  Ear pain.  Sinus pain.  Chest pain.  You have chronic lung disease and any of the following:  Wheezing.  Prolonged cough.  Coughing up blood.  A change in your usual mucus.  You have a stiff neck.  You have changes in your:  Vision.  Hearing.  Thinking.  Mood. MAKE SURE YOU:   Understand these instructions.  Will watch your condition.  Will get help right away if you are not doing well or get worse.   This information is not intended to replace advice given to you by your health care provider. Make sure you discuss any questions you have with your health care provider.   Document Released: 11/03/2000 Document Revised: 09/24/2014 Document Reviewed: 08/15/2013 Elsevier Interactive Patient Education [employer].

## 2015-09-11 DIAGNOSIS — I5022 Chronic systolic (congestive) heart failure: Secondary | ICD-10-CM | POA: Diagnosis not present

## 2015-09-11 DIAGNOSIS — I120 Hypertensive chronic kidney disease with stage 5 chronic kidney disease or end stage renal disease: Secondary | ICD-10-CM | POA: Diagnosis not present

## 2015-09-11 DIAGNOSIS — I480 Paroxysmal atrial fibrillation: Secondary | ICD-10-CM | POA: Diagnosis not present

## 2015-09-11 DIAGNOSIS — I2510 Atherosclerotic heart disease of native coronary artery without angina pectoris: Secondary | ICD-10-CM | POA: Diagnosis not present

## 2015-09-11 DIAGNOSIS — N186 End stage renal disease: Secondary | ICD-10-CM | POA: Diagnosis not present

## 2015-09-11 DIAGNOSIS — Z7901 Long term (current) use of anticoagulants: Secondary | ICD-10-CM | POA: Diagnosis not present

## 2015-09-16 DIAGNOSIS — K5790 Diverticulosis of intestine, part unspecified, without perforation or abscess without bleeding: Secondary | ICD-10-CM | POA: Diagnosis not present

## 2015-09-16 DIAGNOSIS — J219 Acute bronchiolitis, unspecified: Secondary | ICD-10-CM | POA: Diagnosis not present

## 2015-09-16 DIAGNOSIS — E1122 Type 2 diabetes mellitus with diabetic chronic kidney disease: Secondary | ICD-10-CM | POA: Diagnosis not present

## 2015-09-16 DIAGNOSIS — N186 End stage renal disease: Secondary | ICD-10-CM | POA: Diagnosis not present

## 2015-09-17 DIAGNOSIS — E1129 Type 2 diabetes mellitus with other diabetic kidney complication: Secondary | ICD-10-CM | POA: Diagnosis not present

## 2015-09-17 DIAGNOSIS — I4892 Unspecified atrial flutter: Secondary | ICD-10-CM | POA: Diagnosis not present

## 2015-09-21 DIAGNOSIS — I120 Hypertensive chronic kidney disease with stage 5 chronic kidney disease or end stage renal disease: Secondary | ICD-10-CM | POA: Diagnosis not present

## 2015-09-21 DIAGNOSIS — N186 End stage renal disease: Secondary | ICD-10-CM | POA: Diagnosis not present

## 2015-09-21 DIAGNOSIS — Z992 Dependence on renal dialysis: Secondary | ICD-10-CM | POA: Diagnosis not present

## 2015-09-22 DIAGNOSIS — N186 End stage renal disease: Secondary | ICD-10-CM | POA: Diagnosis not present

## 2015-09-22 DIAGNOSIS — E1129 Type 2 diabetes mellitus with other diabetic kidney complication: Secondary | ICD-10-CM | POA: Diagnosis not present

## 2015-09-22 DIAGNOSIS — N2581 Secondary hyperparathyroidism of renal origin: Secondary | ICD-10-CM | POA: Diagnosis not present

## 2015-09-23 DIAGNOSIS — N186 End stage renal disease: Secondary | ICD-10-CM | POA: Diagnosis not present

## 2015-09-23 DIAGNOSIS — I480 Paroxysmal atrial fibrillation: Secondary | ICD-10-CM | POA: Diagnosis not present

## 2015-09-23 DIAGNOSIS — Z7901 Long term (current) use of anticoagulants: Secondary | ICD-10-CM | POA: Diagnosis not present

## 2015-09-23 DIAGNOSIS — I5022 Chronic systolic (congestive) heart failure: Secondary | ICD-10-CM | POA: Diagnosis not present

## 2015-09-23 DIAGNOSIS — I2510 Atherosclerotic heart disease of native coronary artery without angina pectoris: Secondary | ICD-10-CM | POA: Diagnosis not present

## 2015-09-23 DIAGNOSIS — I120 Hypertensive chronic kidney disease with stage 5 chronic kidney disease or end stage renal disease: Secondary | ICD-10-CM | POA: Diagnosis not present

## 2015-09-24 DIAGNOSIS — N186 End stage renal disease: Secondary | ICD-10-CM | POA: Diagnosis not present

## 2015-09-24 DIAGNOSIS — N2581 Secondary hyperparathyroidism of renal origin: Secondary | ICD-10-CM | POA: Diagnosis not present

## 2015-09-24 DIAGNOSIS — E1129 Type 2 diabetes mellitus with other diabetic kidney complication: Secondary | ICD-10-CM | POA: Diagnosis not present

## 2015-09-25 ENCOUNTER — Other Ambulatory Visit: Payer: Self-pay | Admitting: Gastroenterology

## 2015-09-25 DIAGNOSIS — K219 Gastro-esophageal reflux disease without esophagitis: Secondary | ICD-10-CM | POA: Diagnosis not present

## 2015-09-25 DIAGNOSIS — R112 Nausea with vomiting, unspecified: Secondary | ICD-10-CM | POA: Diagnosis not present

## 2015-09-25 DIAGNOSIS — K5730 Diverticulosis of large intestine without perforation or abscess without bleeding: Secondary | ICD-10-CM | POA: Diagnosis not present

## 2015-09-25 DIAGNOSIS — R1032 Left lower quadrant pain: Secondary | ICD-10-CM | POA: Diagnosis not present

## 2015-09-26 DIAGNOSIS — E1129 Type 2 diabetes mellitus with other diabetic kidney complication: Secondary | ICD-10-CM | POA: Diagnosis not present

## 2015-09-26 DIAGNOSIS — N186 End stage renal disease: Secondary | ICD-10-CM | POA: Diagnosis not present

## 2015-09-26 DIAGNOSIS — N2581 Secondary hyperparathyroidism of renal origin: Secondary | ICD-10-CM | POA: Diagnosis not present

## 2015-09-28 ENCOUNTER — Telehealth: Payer: Self-pay | Admitting: Internal Medicine

## 2015-09-28 NOTE — Telephone Encounter (Signed)
Nausea and vomiting x 2 weeks and bad in past few days. Saw Dr. Collene Mares 4 d ago but inable to get relief from ant-emetic and havin persistent vomiting. CHF and ESRD on HD.  Feels bad, ? Dehydrated Advised ED eval - she will take him

## 2015-09-29 DIAGNOSIS — E1129 Type 2 diabetes mellitus with other diabetic kidney complication: Secondary | ICD-10-CM | POA: Diagnosis not present

## 2015-09-29 DIAGNOSIS — N2581 Secondary hyperparathyroidism of renal origin: Secondary | ICD-10-CM | POA: Diagnosis not present

## 2015-09-29 DIAGNOSIS — N186 End stage renal disease: Secondary | ICD-10-CM | POA: Diagnosis not present

## 2015-10-01 DIAGNOSIS — N2581 Secondary hyperparathyroidism of renal origin: Secondary | ICD-10-CM | POA: Diagnosis not present

## 2015-10-01 DIAGNOSIS — N186 End stage renal disease: Secondary | ICD-10-CM | POA: Diagnosis not present

## 2015-10-01 DIAGNOSIS — E1129 Type 2 diabetes mellitus with other diabetic kidney complication: Secondary | ICD-10-CM | POA: Diagnosis not present

## 2015-10-03 DIAGNOSIS — N2581 Secondary hyperparathyroidism of renal origin: Secondary | ICD-10-CM | POA: Diagnosis not present

## 2015-10-03 DIAGNOSIS — N186 End stage renal disease: Secondary | ICD-10-CM | POA: Diagnosis not present

## 2015-10-03 DIAGNOSIS — E1129 Type 2 diabetes mellitus with other diabetic kidney complication: Secondary | ICD-10-CM | POA: Diagnosis not present

## 2015-10-06 DIAGNOSIS — E1129 Type 2 diabetes mellitus with other diabetic kidney complication: Secondary | ICD-10-CM | POA: Diagnosis not present

## 2015-10-06 DIAGNOSIS — N2581 Secondary hyperparathyroidism of renal origin: Secondary | ICD-10-CM | POA: Diagnosis not present

## 2015-10-06 DIAGNOSIS — N186 End stage renal disease: Secondary | ICD-10-CM | POA: Diagnosis not present

## 2015-10-08 DIAGNOSIS — E1129 Type 2 diabetes mellitus with other diabetic kidney complication: Secondary | ICD-10-CM | POA: Diagnosis not present

## 2015-10-08 DIAGNOSIS — N2581 Secondary hyperparathyroidism of renal origin: Secondary | ICD-10-CM | POA: Diagnosis not present

## 2015-10-08 DIAGNOSIS — N186 End stage renal disease: Secondary | ICD-10-CM | POA: Diagnosis not present

## 2015-10-10 DIAGNOSIS — N186 End stage renal disease: Secondary | ICD-10-CM | POA: Diagnosis not present

## 2015-10-10 DIAGNOSIS — E1129 Type 2 diabetes mellitus with other diabetic kidney complication: Secondary | ICD-10-CM | POA: Diagnosis not present

## 2015-10-10 DIAGNOSIS — N2581 Secondary hyperparathyroidism of renal origin: Secondary | ICD-10-CM | POA: Diagnosis not present

## 2015-10-13 DIAGNOSIS — N186 End stage renal disease: Secondary | ICD-10-CM | POA: Diagnosis not present

## 2015-10-13 DIAGNOSIS — N2581 Secondary hyperparathyroidism of renal origin: Secondary | ICD-10-CM | POA: Diagnosis not present

## 2015-10-13 DIAGNOSIS — E1129 Type 2 diabetes mellitus with other diabetic kidney complication: Secondary | ICD-10-CM | POA: Diagnosis not present

## 2015-10-14 ENCOUNTER — Ambulatory Visit (HOSPITAL_COMMUNITY)
Admission: RE | Admit: 2015-10-14 | Discharge: 2015-10-14 | Disposition: A | Discharge status: Home / Self Care | Payer: Medicare Other | Source: Ambulatory Visit | Attending: Gastroenterology | Admitting: Gastroenterology

## 2015-10-14 ENCOUNTER — Encounter (HOSPITAL_COMMUNITY)
Admission: RE | Admit: 2015-10-14 | Discharge: 2015-10-14 | Disposition: A | Discharge status: Home / Self Care | Payer: Medicare Other | Source: Ambulatory Visit | Attending: Gastroenterology | Admitting: Gastroenterology

## 2015-10-14 DIAGNOSIS — Z905 Acquired absence of kidney: Secondary | ICD-10-CM | POA: Diagnosis not present

## 2015-10-14 DIAGNOSIS — R112 Nausea with vomiting, unspecified: Secondary | ICD-10-CM | POA: Diagnosis not present

## 2015-10-14 DIAGNOSIS — N281 Cyst of kidney, acquired: Secondary | ICD-10-CM | POA: Diagnosis not present

## 2015-10-14 DIAGNOSIS — R109 Unspecified abdominal pain: Secondary | ICD-10-CM | POA: Diagnosis not present

## 2015-10-14 DIAGNOSIS — R93422 Abnormal radiologic findings on diagnostic imaging of left kidney: Secondary | ICD-10-CM | POA: Insufficient documentation

## 2015-10-14 MED ORDER — TECHNETIUM TC 99M MEBROFENIN IV KIT
5.0000 | PACK | Freq: Once | INTRAVENOUS | Status: AC | PRN
  Administered 2015-10-14: 5 via INTRAVENOUS

## 2015-10-15 DIAGNOSIS — N186 End stage renal disease: Secondary | ICD-10-CM | POA: Diagnosis not present

## 2015-10-15 DIAGNOSIS — E1129 Type 2 diabetes mellitus with other diabetic kidney complication: Secondary | ICD-10-CM | POA: Diagnosis not present

## 2015-10-15 DIAGNOSIS — N2581 Secondary hyperparathyroidism of renal origin: Secondary | ICD-10-CM | POA: Diagnosis not present

## 2015-10-17 DIAGNOSIS — N186 End stage renal disease: Secondary | ICD-10-CM | POA: Diagnosis not present

## 2015-10-17 DIAGNOSIS — E1129 Type 2 diabetes mellitus with other diabetic kidney complication: Secondary | ICD-10-CM | POA: Diagnosis not present

## 2015-10-17 DIAGNOSIS — N2581 Secondary hyperparathyroidism of renal origin: Secondary | ICD-10-CM | POA: Diagnosis not present

## 2015-10-20 DIAGNOSIS — N186 End stage renal disease: Secondary | ICD-10-CM | POA: Diagnosis not present

## 2015-10-20 DIAGNOSIS — N2581 Secondary hyperparathyroidism of renal origin: Secondary | ICD-10-CM | POA: Diagnosis not present

## 2015-10-20 DIAGNOSIS — E1129 Type 2 diabetes mellitus with other diabetic kidney complication: Secondary | ICD-10-CM | POA: Diagnosis not present

## 2015-10-22 ENCOUNTER — Other Ambulatory Visit: Payer: Self-pay | Admitting: Gastroenterology

## 2015-10-22 DIAGNOSIS — Z992 Dependence on renal dialysis: Secondary | ICD-10-CM | POA: Diagnosis not present

## 2015-10-22 DIAGNOSIS — N2581 Secondary hyperparathyroidism of renal origin: Secondary | ICD-10-CM | POA: Diagnosis not present

## 2015-10-22 DIAGNOSIS — N186 End stage renal disease: Secondary | ICD-10-CM | POA: Diagnosis not present

## 2015-10-22 DIAGNOSIS — I120 Hypertensive chronic kidney disease with stage 5 chronic kidney disease or end stage renal disease: Secondary | ICD-10-CM | POA: Diagnosis not present

## 2015-10-22 DIAGNOSIS — E1129 Type 2 diabetes mellitus with other diabetic kidney complication: Secondary | ICD-10-CM | POA: Diagnosis not present

## 2015-10-24 DIAGNOSIS — N2581 Secondary hyperparathyroidism of renal origin: Secondary | ICD-10-CM | POA: Diagnosis not present

## 2015-10-24 DIAGNOSIS — N186 End stage renal disease: Secondary | ICD-10-CM | POA: Diagnosis not present

## 2015-10-24 DIAGNOSIS — E1129 Type 2 diabetes mellitus with other diabetic kidney complication: Secondary | ICD-10-CM | POA: Diagnosis not present

## 2015-10-28 DIAGNOSIS — I120 Hypertensive chronic kidney disease with stage 5 chronic kidney disease or end stage renal disease: Secondary | ICD-10-CM | POA: Diagnosis not present

## 2015-10-28 DIAGNOSIS — E6609 Other obesity due to excess calories: Secondary | ICD-10-CM | POA: Diagnosis not present

## 2015-10-28 DIAGNOSIS — I2510 Atherosclerotic heart disease of native coronary artery without angina pectoris: Secondary | ICD-10-CM | POA: Diagnosis not present

## 2015-10-28 DIAGNOSIS — I5022 Chronic systolic (congestive) heart failure: Secondary | ICD-10-CM | POA: Diagnosis not present

## 2015-10-28 DIAGNOSIS — N186 End stage renal disease: Secondary | ICD-10-CM | POA: Diagnosis not present

## 2015-11-11 ENCOUNTER — Encounter (HOSPITAL_COMMUNITY): Payer: Self-pay | Admitting: *Deleted

## 2015-11-18 ENCOUNTER — Ambulatory Visit (HOSPITAL_COMMUNITY)
Admission: RE | Admit: 2015-11-18 | Payer: BLUE CROSS/BLUE SHIELD | Source: Ambulatory Visit | Admitting: Gastroenterology

## 2015-11-18 SURGERY — ESOPHAGOGASTRODUODENOSCOPY (EGD) WITH PROPOFOL
Anesthesia: Monitor Anesthesia Care

## 2015-11-19 DIAGNOSIS — I4892 Unspecified atrial flutter: Secondary | ICD-10-CM | POA: Diagnosis not present

## 2015-11-21 DIAGNOSIS — Z992 Dependence on renal dialysis: Secondary | ICD-10-CM | POA: Diagnosis not present

## 2015-11-21 DIAGNOSIS — I120 Hypertensive chronic kidney disease with stage 5 chronic kidney disease or end stage renal disease: Secondary | ICD-10-CM | POA: Diagnosis not present

## 2015-11-21 DIAGNOSIS — N186 End stage renal disease: Secondary | ICD-10-CM | POA: Diagnosis not present

## 2015-11-24 DIAGNOSIS — D631 Anemia in chronic kidney disease: Secondary | ICD-10-CM | POA: Diagnosis not present

## 2015-11-24 DIAGNOSIS — N186 End stage renal disease: Secondary | ICD-10-CM | POA: Diagnosis not present

## 2015-11-24 DIAGNOSIS — N2581 Secondary hyperparathyroidism of renal origin: Secondary | ICD-10-CM | POA: Diagnosis not present

## 2015-11-24 DIAGNOSIS — E1129 Type 2 diabetes mellitus with other diabetic kidney complication: Secondary | ICD-10-CM | POA: Diagnosis not present

## 2015-11-27 ENCOUNTER — Encounter (HOSPITAL_COMMUNITY): Payer: Self-pay | Admitting: *Deleted

## 2015-11-27 DIAGNOSIS — I120 Hypertensive chronic kidney disease with stage 5 chronic kidney disease or end stage renal disease: Secondary | ICD-10-CM | POA: Diagnosis not present

## 2015-11-27 DIAGNOSIS — N186 End stage renal disease: Secondary | ICD-10-CM | POA: Diagnosis not present

## 2015-11-27 DIAGNOSIS — I5022 Chronic systolic (congestive) heart failure: Secondary | ICD-10-CM | POA: Diagnosis not present

## 2015-11-27 DIAGNOSIS — R21 Rash and other nonspecific skin eruption: Secondary | ICD-10-CM | POA: Diagnosis not present

## 2015-11-27 DIAGNOSIS — E6609 Other obesity due to excess calories: Secondary | ICD-10-CM | POA: Diagnosis not present

## 2015-11-27 DIAGNOSIS — I2510 Atherosclerotic heart disease of native coronary artery without angina pectoris: Secondary | ICD-10-CM | POA: Diagnosis not present

## 2015-12-01 ENCOUNTER — Encounter (HOSPITAL_COMMUNITY): Payer: Self-pay | Admitting: Anesthesiology

## 2015-12-01 NOTE — Anesthesia Preprocedure Evaluation (Addendum)
Anesthesia Evaluation  Patient identified by MRN, date of birth, ID band Patient awake    Reviewed: Allergy & Precautions, NPO status , Patient's Chart, lab work & pertinent test results, reviewed documented beta blocker date and time   Airway Mallampati: III  TM Distance: >3 FB Neck ROM: Full    Dental  (+) Missing   Pulmonary shortness of breath, with exertion, at rest and lying,    Pulmonary exam normal breath sounds clear to auscultation       Cardiovascular Exercise Tolerance: Poor hypertension, Pt. on medications and Pt. on home beta blockers + CAD, + Past MI and +CHF  Normal cardiovascular exam+ dysrhythmias Atrial Fibrillation  Rhythm:Regular Rate:Normal  Echo 06/2015- diffuse hypokinesis, LVEF 20-25% EKG 08/2015- NSR, prolonged QTc, lateral T wave inversion, biatrial enlargement    Neuro/Psych CVA, No Residual Symptoms negative psych ROS   GI/Hepatic GERD  Medicated and Controlled,Nausea & Vomiting   Endo/Other  diabetes, Well Controlled, Type 2Obesity  Renal/GU ESRF and DialysisRenal diseaseHx/o right nephrectomy for renal cell carcinoma Last dialyzed 7/10  negative genitourinary   Musculoskeletal   Abdominal (+) + obese,   Peds  Hematology  (+) anemia ,   Anesthesia Other Findings   Reproductive/Obstetrics negative OB ROS                          Anesthesia Physical Anesthesia Plan  ASA: IV  Anesthesia Plan: MAC   Post-op Pain Management:    Induction: Intravenous  Airway Management Planned: Simple Face Mask  Additional Equipment:   Intra-op Plan:   Post-operative Plan:   Informed Consent: I have reviewed the patients History and Physical, chart, labs and discussed the procedure including the risks, benefits and alternatives for the proposed anesthesia with the patient or authorized representative who has indicated his/her understanding and acceptance.   Dental  advisory given  Plan Discussed with: CRNA, Anesthesiologist and Surgeon  Anesthesia Plan Comments:         Anesthesia Quick Evaluation

## 2015-12-02 ENCOUNTER — Encounter (HOSPITAL_COMMUNITY): Payer: Self-pay

## 2015-12-02 ENCOUNTER — Ambulatory Visit (HOSPITAL_COMMUNITY): Payer: Medicare Other | Admitting: Anesthesiology

## 2015-12-02 ENCOUNTER — Ambulatory Visit (HOSPITAL_COMMUNITY)
Admission: RE | Admit: 2015-12-02 | Discharge: 2015-12-02 | Disposition: A | Discharge status: Home / Self Care | Payer: Medicare Other | Source: Ambulatory Visit | Attending: Gastroenterology | Admitting: Gastroenterology

## 2015-12-02 ENCOUNTER — Encounter (HOSPITAL_COMMUNITY)
Admission: RE | Disposition: A | Discharge status: Home / Self Care | Payer: Self-pay | Source: Ambulatory Visit | Attending: Gastroenterology

## 2015-12-02 DIAGNOSIS — I509 Heart failure, unspecified: Secondary | ICD-10-CM | POA: Diagnosis not present

## 2015-12-02 DIAGNOSIS — I4891 Unspecified atrial fibrillation: Secondary | ICD-10-CM | POA: Insufficient documentation

## 2015-12-02 DIAGNOSIS — H5441 Blindness, right eye, normal vision left eye: Secondary | ICD-10-CM | POA: Diagnosis not present

## 2015-12-02 DIAGNOSIS — Z8673 Personal history of transient ischemic attack (TIA), and cerebral infarction without residual deficits: Secondary | ICD-10-CM | POA: Insufficient documentation

## 2015-12-02 DIAGNOSIS — R112 Nausea with vomiting, unspecified: Secondary | ICD-10-CM | POA: Diagnosis not present

## 2015-12-02 DIAGNOSIS — I132 Hypertensive heart and chronic kidney disease with heart failure and with stage 5 chronic kidney disease, or end stage renal disease: Secondary | ICD-10-CM | POA: Insufficient documentation

## 2015-12-02 DIAGNOSIS — K2970 Gastritis, unspecified, without bleeding: Secondary | ICD-10-CM | POA: Insufficient documentation

## 2015-12-02 DIAGNOSIS — Z6832 Body mass index (BMI) 32.0-32.9, adult: Secondary | ICD-10-CM | POA: Diagnosis not present

## 2015-12-02 DIAGNOSIS — Z85528 Personal history of other malignant neoplasm of kidney: Secondary | ICD-10-CM | POA: Diagnosis not present

## 2015-12-02 DIAGNOSIS — I2510 Atherosclerotic heart disease of native coronary artery without angina pectoris: Secondary | ICD-10-CM | POA: Diagnosis not present

## 2015-12-02 DIAGNOSIS — E1122 Type 2 diabetes mellitus with diabetic chronic kidney disease: Secondary | ICD-10-CM | POA: Diagnosis not present

## 2015-12-02 DIAGNOSIS — I252 Old myocardial infarction: Secondary | ICD-10-CM | POA: Insufficient documentation

## 2015-12-02 DIAGNOSIS — K828 Other specified diseases of gallbladder: Secondary | ICD-10-CM | POA: Insufficient documentation

## 2015-12-02 DIAGNOSIS — Z79899 Other long term (current) drug therapy: Secondary | ICD-10-CM | POA: Diagnosis not present

## 2015-12-02 DIAGNOSIS — Z992 Dependence on renal dialysis: Secondary | ICD-10-CM | POA: Insufficient documentation

## 2015-12-02 DIAGNOSIS — K219 Gastro-esophageal reflux disease without esophagitis: Secondary | ICD-10-CM | POA: Diagnosis not present

## 2015-12-02 DIAGNOSIS — E669 Obesity, unspecified: Secondary | ICD-10-CM | POA: Diagnosis not present

## 2015-12-02 DIAGNOSIS — N186 End stage renal disease: Secondary | ICD-10-CM | POA: Insufficient documentation

## 2015-12-02 DIAGNOSIS — R1011 Right upper quadrant pain: Secondary | ICD-10-CM | POA: Diagnosis not present

## 2015-12-02 DIAGNOSIS — Z905 Acquired absence of kidney: Secondary | ICD-10-CM | POA: Diagnosis not present

## 2015-12-02 DIAGNOSIS — Z7901 Long term (current) use of anticoagulants: Secondary | ICD-10-CM | POA: Insufficient documentation

## 2015-12-02 DIAGNOSIS — I120 Hypertensive chronic kidney disease with stage 5 chronic kidney disease or end stage renal disease: Secondary | ICD-10-CM | POA: Diagnosis not present

## 2015-12-02 HISTORY — PX: ESOPHAGOGASTRODUODENOSCOPY (EGD) WITH PROPOFOL: SHX5813

## 2015-12-02 LAB — POCT I-STAT 4, (NA,K, GLUC, HGB,HCT)
Glucose, Bld: 87 mg/dL (ref 65–99)
HCT: 44 % (ref 39.0–52.0)
Hemoglobin: 15 g/dL (ref 13.0–17.0)
Potassium: 5.6 mmol/L — ABNORMAL HIGH (ref 3.5–5.1)
Sodium: 139 mmol/L (ref 135–145)

## 2015-12-02 SURGERY — ESOPHAGOGASTRODUODENOSCOPY (EGD) WITH PROPOFOL
Anesthesia: Monitor Anesthesia Care

## 2015-12-02 MED ORDER — LACTATED RINGERS IV SOLN: INTRAVENOUS | Status: DC

## 2015-12-02 MED ORDER — LIDOCAINE HCL (CARDIAC) 20 MG/ML IV SOLN
INTRAVENOUS | Status: AC
  Filled 2015-12-02: qty 5

## 2015-12-02 MED ORDER — PROPOFOL 10 MG/ML IV BOLUS
INTRAVENOUS | Status: DC | PRN
  Administered 2015-12-02: 100 mg via INTRAVENOUS

## 2015-12-02 MED ORDER — LIDOCAINE HCL (CARDIAC) 20 MG/ML IV SOLN
INTRAVENOUS | Status: DC | PRN
  Administered 2015-12-02: 50 mg via INTRAVENOUS

## 2015-12-02 MED ORDER — PROPOFOL 10 MG/ML IV BOLUS
INTRAVENOUS | Status: AC
  Filled 2015-12-02: qty 20

## 2015-12-02 MED ORDER — SODIUM CHLORIDE 0.9 % IV SOLN
INTRAVENOUS | Status: DC
  Administered 2015-12-02: 07:00:00 via INTRAVENOUS

## 2015-12-02 SURGICAL SUPPLY — 15 items

## 2015-12-02 NOTE — Op Note (Signed)
[facility] Patient Name: [name] Procedure Date: 12/02/2015 MRN: [id number] Attending MD: Juanita Craver , MD Date of Birth: 06/19/1960 CSN: [id number] Age: 55 Admit Type: Outpatient Procedure:                Diagnostic EGD. Indications:              GERD, Nausea and vomiting; Abdominal pain in the                            right upper quadrant. Providers:                Juanita Craver, MD, Cleda Daub, RN, William Dalton, Technician Referring MD:             Dixie Dials, MD Medicines:                Monitored Anesthesia Care Complications:            No immediate complications. Estimated Blood Loss:     Estimated blood loss: none. Procedure:                Pre-anesthesia assessment: - Prior to the                            procedure, a history and physical was performed,                            and patient medications and allergies were                            reviewed. The patient's tolerance of previous                            anesthesia was also reviewed. The risks and                            benefits of the procedure and the sedation options                            and risks were discussed with the patient. All                            questions were answered, and informed consent was                            obtained. Prior anticoagulants: The patient has                            taken Coumadin (warfarin), last dose was 9 days                            prior to procedure. ASA Grade Assessment: IV - A  patient with severe systemic disease that is a                            constant threat to life. After reviewing the risks                            and benefits, the patient was deemed in                            satisfactory condition to undergo the procedure.                           After obtaining informed consent, the endoscope was                            passed under  direct vision. Throughout the                            procedure, the patient's blood pressure, pulse, and                            oxygen saturations were monitored continuously. The                            Endoscope was introduced through the mouth, and                            advanced to the second part of duodenum. The upper                            GI endoscopy was accomplished without difficulty.                            The patient tolerated the procedure well. Scope In: Scope Out: Findings:      Mucosal changes including longitudinal furrows were found in the entire       esophagus; esophageal findings were graded using the Eosinophilic       Esophagitis Endoscopic Reference Score (EoE-EREFS) as: Furrows Grade 1       Present (vertical lines with or without visible depth).      Patchy mild inflammation characterized by congestion (edema) was found       in the gastric body.      The cardia and gastric fundus were normal on retroflexion.      The examined duodenum was normal. Impression:               - Esophageal mucosal changes suggestive of                            eosinophilic esophagitis.                           - Mild gastritis in the midbody; otherwise normal  appearing stomach..                           - Normal examined duodenum.                           - No specimens collected. Moderate Sedation:      N/A- Per Anesthesia Care. Recommendation:           - Low fat diet.                           - Continue present medications.                           - Refer to a surgeon at appointment to be scheduled.                           - Return to my office PRN. Procedure Code(s):        --- Professional ---                           [phone], Esophagogastroduodenoscopy, flexible,                            transoral; diagnostic, including collection of                            specimen(s) by brushing or washing, when performed                             (separate procedure) Diagnosis Code(s):        --- Professional ---                           K21.9, Gastro-esophageal reflux disease without                            esophagitis                           R11.2, Nausea with vomiting, unspecified                           R10.11, Right upper quadrant pain                           K29.70, Gastritis, unspecified, without bleeding CPT copyright 2016 American Medical Association. All rights reserved. The codes documented in this report are preliminary and upon coder review may  be revised to meet current compliance requirements. Juanita Craver, MD Juanita Craver, MD 12/02/2015 7:53:35 AM This report has been signed electronically. Number of Addenda: 0

## 2015-12-02 NOTE — Anesthesia Postprocedure Evaluation (Signed)
Anesthesia Post Note  Patient: [name]  Procedure(s) Performed: Procedure(s) (LRB): ESOPHAGOGASTRODUODENOSCOPY (EGD) WITH PROPOFOL (N/A)  Patient location during evaluation: PACU Anesthesia Type: MAC Level of consciousness: awake and alert and oriented Pain management: pain level controlled Vital Signs Assessment: post-procedure vital signs reviewed and stable Respiratory status: spontaneous breathing, nonlabored ventilation and respiratory function stable Cardiovascular status: blood pressure returned to baseline and stable Postop Assessment: no signs of nausea or vomiting Anesthetic complications: no    Last Vitals:  Filed Vitals:   12/02/15 0810 12/02/15 0815  BP: 116/68   Pulse: 62 61  Temp:    Resp: 18 16    Last Pain: There were no vitals filed for this visit.               Nadeem Romanoski A.

## 2015-12-02 NOTE — Transfer of Care (Signed)
Immediate Anesthesia Transfer of Care Note  Patient: [name]  Procedure(s) Performed: Procedure(s): ESOPHAGOGASTRODUODENOSCOPY (EGD) WITH PROPOFOL (N/A)  Patient Location: PACU  Anesthesia Type:MAC  Level of Consciousness: awake, alert  and oriented  Airway & Oxygen Therapy: Patient Spontanous Breathing and Patient connected to nasal cannula oxygen  Post-op Assessment: Report given to RN and Post -op Vital signs reviewed and stable  Post vital signs: Reviewed and stable  Last Vitals:  Filed Vitals:   12/02/15 0644 12/02/15 0741  BP: 112/67 94/51  Pulse: 71 70  Temp: 36.7 C   Resp: 21 15    Last Pain: There were no vitals filed for this visit.       Complications: No apparent anesthesia complications

## 2015-12-02 NOTE — H&P (Signed)
[name] is an 55 y.o. male.   Chief Complaint: RUQ pain with nausea and vomiting. HPI: 55 year old black male with RUQ pain, nausea and vomiting here for an EGD. He was found to have biliary dyskinesia on HIDA scan with an estimated EF of 20%. See office notes for details.   Past Medical History  Diagnosis Date  . CHF (congestive heart failure) ([place])   . History of unilateral nephrectomy   . Hypertension   . Cardiomyopathy   . Gout   . Myocardial infarction [facility]) ?2006  . Shortness of breath 05/19/11    "at rest, lying down, w/exertion"  . Umbilical hernia [id number]    unrepaired  . Stroke [facility]) 02/2011    05/19/11 denies residual  . Diabetes mellitus     pt. states he's borderline diabetic., no longer taking med,- off med. since 2013  . History of nephrectomy 07/04/2012    History of right nephrectomy in 2002 for renal cell carcinoma   . 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 Diverticulitis November 2016    reoccurred in December 2016  . Coronary artery disease     normal coronaries by 10/10/08 cath, Cardiac Cath 08-04-12 epic.Dr. Doylene Canard follows  . Renal cell carcinoma     dialysis- MWF- Dr. Mercy Moore follows.  Marland Kitchen ESRD (end stage renal disease) [facility])    Past Surgical History  Procedure Laterality Date  . Nephrectomy  2000    right  . Av fistula placement  03/29/2011    Procedure: ARTERIOVENOUS (AV) FISTULA CREATION;  Surgeon: Hinda Lenis, MD;  Location: [place];  Service: Vascular;  Laterality: Left;  LEFT RADIOCEPHALIC , Arteriovenous [id number])  . US echocardiography  05/20/11  . Cardiac catheterization  05/21/11  . Finger surgery      L little finger- ORIF- /w remaining hardware - 1990's    . Umbilical hernia repair N/A 07/04/2012    Procedure: LAPAROSCOPIC UMBILICAL HERNIA;  Surgeon: Madilyn Hook, DO;  Location: [place];  Service: General;  Laterality: N/A;  . Insertion of mesh N/A 07/04/2012    Procedure: INSERTION OF MESH;  Surgeon: Madilyn Hook, DO;  Location: [place];  Service: General;  Laterality: N/A;  . Hernia repair N/A [id number]    Umbilical hernia repair  . Dialysis cath placed    . Revison of arteriovenous fistula Left 06/05/2013    Procedure: REVISON OF LEFT RADIOCEPHALIC  ARTERIOVENOUS FISTULA;  Surgeon: Conrad Mount Shasta, MD;  Location: [place];  Service: Vascular;  Laterality: Left;  . Right heart catheterization N/A 05/21/2011    Procedure: RIGHT HEART CATH;  Surgeon: Birdie Riddle, MD;  Location: [facility] CATH LAB;  Service: Cardiovascular;  Laterality: N/A;  . Left and right heart catheterization with coronary angiogram N/A 08/04/2012    Procedure: LEFT AND RIGHT HEART CATHETERIZATION WITH CORONARY ANGIOGRAM;  Surgeon: Birdie Riddle, MD;  Location: [place] CATH LAB;  Service: Cardiovascular;  Laterality: N/A;   Family History  Problem Relation Age of Onset  . Hypertension Mother   . Kidney disease Mother    Social History:  reports that he has never smoked. He has never used smokeless tobacco. He reports that he does not drink alcohol or use illicit drugs.  Allergies:  Allergies  Allergen Reactions  . Ace Inhibitors Itching and Cough   Medications Prior to Admission  Medication Sig Dispense Refill  . amiodarone (PACERONE) 200 MG tablet Take 0.5 tablets (100 mg  total) by mouth daily. 15 tablet 6  . amLODipine (NORVASC) 10 MG tablet Take 10 mg by mouth daily.     Marland Kitchen b complex-vitamin c-folic acid (NEPHRO-VITE) 0.8 MG TABS tablet Take 1 tablet by mouth daily with supper.     . carvedilol (COREG) 25 MG tablet Take 0.5 tablets (12.5 mg total) by mouth See admin instructions. Take 1/2 tablet (12.5 mg) by mouth in the evening on dialysis (Monday, Wednesday, Friday and 1/2 tablet (12.5 mg) twice daily on Tuesday, Thursday, Saturday, Sunday (non-dialysis days)    . cephALEXin (KEFLEX) 500 MG capsule Take 1 capsule (500 mg total) by mouth daily. 10 capsule 0  . Cholecalciferol (VITAMIN D PO) Take 1 tablet by mouth daily.    .  cinacalcet (SENSIPAR) 90 MG tablet Take 90 mg by mouth 4 (four) times a week. Tuesday, Thursday, Saturday, Sunday - non-dialysis days    . ciprofloxacin (CIPRO) 500 MG tablet Take 1 tablet (500 mg total) by mouth every 12 (twelve) hours. 14 tablet 0  . dextromethorphan (DELSYM) 30 MG/5ML liquid Take 10 mLs (60 mg total) by mouth 2 (two) times daily. 89 mL 0  . guaiFENesin-dextromethorphan (ROBITUSSIN DM) 100-10 MG/5ML syrup Take 5 mLs by mouth every 4 (four) hours as needed for cough. 118 mL 0  . isosorbide dinitrate (ISORDIL) 20 MG tablet Take 20 mg by mouth daily.     Marland Kitchen lidocaine-prilocaine (EMLA) cream Apply 1 application topically every Monday, Wednesday, and Friday. Apply to port access prior to dialysis  12  . metroNIDAZOLE (FLAGYL) 500 MG tablet Take 1 tablet (500 mg total) by mouth 3 (three) times daily. 21 tablet 0  . oxyCODONE-acetaminophen (PERCOCET/ROXICET) 5-325 MG per tablet Take 1 tablet by mouth at bedtime as needed for moderate pain. (Patient taking differently: Take 1 tablet by mouth at bedtime as needed (gout pain). ) 20 tablet 0  . pantoprazole (PROTONIX) 40 MG tablet Take 1 tablet (40 mg total) by mouth daily at 12 noon. (Patient taking differently: Take 40 mg by mouth daily. ) 30 tablet 0  . sevelamer carbonate (RENVELA) 800 MG tablet Take 3 tablets (2,400 mg total) by mouth 3 (three) times daily with meals. 270 tablet 3  . warfarin (COUMADIN) 6 MG tablet Take 1 tablet (6 mg total) by mouth daily at 6 PM. (Patient taking differently: Take 3-6 mg by mouth daily at 6 PM. alternating 3 and 6mg  daily) 30 tablet 1  . nitroGLYCERIN (NITROSTAT) 0.4 MG SL tablet Place 0.4 mg under the tongue every 5 (five) minutes as needed for chest pain.   0   Review of Systems  Constitutional: Negative.   HENT: Negative.   Eyes: Negative.   Respiratory: Positive for cough, sputum production and shortness of breath. Negative for hemoptysis and wheezing.   Cardiovascular: Negative.    Gastrointestinal: Positive for nausea, vomiting and abdominal pain.  Genitourinary: Negative.   Musculoskeletal: Negative.   Skin: Negative.   Neurological: Negative.   Endo/Heme/Allergies: Negative.   Psychiatric/Behavioral: Negative.    Blood pressure 112/67, pulse 71, temperature 98 F (36.7 C), temperature source Oral, resp. rate 21, height 6\' 1"  (1.854 m), weight 110.678 kg (244 lb), SpO2 100 %. Physical Exam  Constitutional: He is oriented to person, place, and time. He appears well-developed and well-nourished.  HENT:  Head: Normocephalic and atraumatic.  Eyes: Conjunctivae and EOM are normal. Pupils are equal, round, and reactive to light.  Neck: Normal range of motion. Neck supple.  GI: Soft. Bowel sounds are  normal. He exhibits no distension and no mass. There is no tenderness. There is no rebound and no guarding.  Neurological: He is alert and oriented to person, place, and time.  Skin: Skin is warm and dry.  Psychiatric: He has a normal mood and affect. His behavior is normal. Judgment and thought content normal.    Assessment/Plan RUQ pain with nausea and vomiting/biliary dyskinesia/GERD: proceed with an EGD at this time.   Gaberiel Youngblood, MD 12/02/2015, 7:02 AM

## 2015-12-03 ENCOUNTER — Encounter (HOSPITAL_COMMUNITY): Payer: Self-pay | Admitting: Gastroenterology

## 2015-12-17 DIAGNOSIS — I4892 Unspecified atrial flutter: Secondary | ICD-10-CM | POA: Diagnosis not present

## 2015-12-17 DIAGNOSIS — E1129 Type 2 diabetes mellitus with other diabetic kidney complication: Secondary | ICD-10-CM | POA: Diagnosis not present

## 2015-12-22 DIAGNOSIS — I120 Hypertensive chronic kidney disease with stage 5 chronic kidney disease or end stage renal disease: Secondary | ICD-10-CM | POA: Diagnosis not present

## 2015-12-22 DIAGNOSIS — N186 End stage renal disease: Secondary | ICD-10-CM | POA: Diagnosis not present

## 2015-12-22 DIAGNOSIS — Z992 Dependence on renal dialysis: Secondary | ICD-10-CM | POA: Diagnosis not present

## 2015-12-24 DIAGNOSIS — N2581 Secondary hyperparathyroidism of renal origin: Secondary | ICD-10-CM | POA: Diagnosis not present

## 2015-12-24 DIAGNOSIS — N186 End stage renal disease: Secondary | ICD-10-CM | POA: Diagnosis not present

## 2015-12-24 DIAGNOSIS — E1129 Type 2 diabetes mellitus with other diabetic kidney complication: Secondary | ICD-10-CM | POA: Diagnosis not present

## 2015-12-26 DIAGNOSIS — N186 End stage renal disease: Secondary | ICD-10-CM | POA: Diagnosis not present

## 2015-12-26 DIAGNOSIS — N2581 Secondary hyperparathyroidism of renal origin: Secondary | ICD-10-CM | POA: Diagnosis not present

## 2015-12-26 DIAGNOSIS — E1129 Type 2 diabetes mellitus with other diabetic kidney complication: Secondary | ICD-10-CM | POA: Diagnosis not present

## 2015-12-29 DIAGNOSIS — E1129 Type 2 diabetes mellitus with other diabetic kidney complication: Secondary | ICD-10-CM | POA: Diagnosis not present

## 2015-12-29 DIAGNOSIS — N186 End stage renal disease: Secondary | ICD-10-CM | POA: Diagnosis not present

## 2015-12-29 DIAGNOSIS — N2581 Secondary hyperparathyroidism of renal origin: Secondary | ICD-10-CM | POA: Diagnosis not present

## 2015-12-30 DIAGNOSIS — N186 End stage renal disease: Secondary | ICD-10-CM | POA: Diagnosis not present

## 2015-12-30 DIAGNOSIS — I2510 Atherosclerotic heart disease of native coronary artery without angina pectoris: Secondary | ICD-10-CM | POA: Diagnosis not present

## 2015-12-30 DIAGNOSIS — Z7901 Long term (current) use of anticoagulants: Secondary | ICD-10-CM | POA: Diagnosis not present

## 2015-12-30 DIAGNOSIS — I5022 Chronic systolic (congestive) heart failure: Secondary | ICD-10-CM | POA: Diagnosis not present

## 2015-12-30 DIAGNOSIS — I120 Hypertensive chronic kidney disease with stage 5 chronic kidney disease or end stage renal disease: Secondary | ICD-10-CM | POA: Diagnosis not present

## 2015-12-31 DIAGNOSIS — N186 End stage renal disease: Secondary | ICD-10-CM | POA: Diagnosis not present

## 2015-12-31 DIAGNOSIS — N2581 Secondary hyperparathyroidism of renal origin: Secondary | ICD-10-CM | POA: Diagnosis not present

## 2015-12-31 DIAGNOSIS — E1129 Type 2 diabetes mellitus with other diabetic kidney complication: Secondary | ICD-10-CM | POA: Diagnosis not present

## 2016-01-02 DIAGNOSIS — N2581 Secondary hyperparathyroidism of renal origin: Secondary | ICD-10-CM | POA: Diagnosis not present

## 2016-01-02 DIAGNOSIS — N186 End stage renal disease: Secondary | ICD-10-CM | POA: Diagnosis not present

## 2016-01-02 DIAGNOSIS — E1129 Type 2 diabetes mellitus with other diabetic kidney complication: Secondary | ICD-10-CM | POA: Diagnosis not present

## 2016-01-05 DIAGNOSIS — N186 End stage renal disease: Secondary | ICD-10-CM | POA: Diagnosis not present

## 2016-01-05 DIAGNOSIS — N2581 Secondary hyperparathyroidism of renal origin: Secondary | ICD-10-CM | POA: Diagnosis not present

## 2016-01-05 DIAGNOSIS — E1129 Type 2 diabetes mellitus with other diabetic kidney complication: Secondary | ICD-10-CM | POA: Diagnosis not present

## 2016-01-06 ENCOUNTER — Ambulatory Visit: Payer: Self-pay | Admitting: General Surgery

## 2016-01-06 DIAGNOSIS — K828 Other specified diseases of gallbladder: Secondary | ICD-10-CM | POA: Diagnosis not present

## 2016-01-06 NOTE — H&P (Signed)
[name] 01/06/2016 3:13 PM Location: [facility] Patient #: [id number] DOB: 05/13/1961 Married / Language: English / Race: Black or African American Male  History of Present Illness Matthew Hollingshead MD; 01/06/2016 3:41 PM) Patient words: new pt, gallbladder.  The patient is a 55 year old male.   Note:He is referred by Dr. Collene Mares for consultation because of RUQ abdominal pain and finding consistent with biliary dyskinesia. He began having these pains about 3 months ago and the frequency has escalated. It seemed be related to eating a fatty meal. Ultrasound demonstrated a normal gallbladder with no stones. Nuclear medicine scan demonstrated findings consistent with biliary dyskinesia-gallbladder ejection fraction 20%. Upper endoscopy not demonstrate knowledge reason for his symptoms. He sent here to discuss cholecystectomy. He is on chronic hemodialysis. He undergoes dialysis every Monday, Wednesday, and Friday. He is also on chronic Coumadin. He saw his cardiologist last week, Dr. Doylene Canard, who felt he couldn't have the surgery. He also said he could stop his Coumadin preop. PMH and problem list is below.  As for abdominal operations, he had a laparoscopic umbilical hernia repair by Dr. Lilyan Punt. He had a right nephrectomy in the past.   Systolic CHF, chronic ([place]) Create Overview Unprioritized 04/21/2015 Rise Patience, MD RUQ pain Create Overview Unprioritized 01/13/2015 Charlynne Cousins, MD Right sided weakness Create Overview Unprioritized 12/18/2014 Melton Alar, PA-C Pulmonary edema Create Overview Unprioritized 01/13/2015 Forde Dandy, MD Hypertension, benign Create Overview Unprioritized 04/21/2015 Rise Patience, MD History of nephrectomy Edit Overview Unprioritized 12/18/2014 Melton Alar, PA-C Overview History of right nephrectomy in 2002 for renal cell carcinoma  Hepatic cyst Create Overview  Unprioritized 12/18/2014 Melton Alar, PA-C Gout, chronic Create Overview Unprioritized 04/21/2015 Rise Patience, MD End-stage renal disease on hemodialysis [facility]) Create Overview Unprioritized 04/21/2015 Rise Patience, MD End stage renal disease [facility]) Create Overview Unprioritized 01/13/2015 Charlynne Cousins, MD DM II (diabetes mellitus, type II), controlled [facility]) Create Overview Unprioritized 04/21/2015 Rise Patience, MD Diverticulitis of intestine without perforation or abscess without bleeding Create Overview Unprioritized 04/21/2015 Rise Patience, MD Diverticulitis large intestine w/o perforation or abscess w/o bleeding Create Overview Unprioritized 12/18/2014 Melton Alar, PA-C Diverticulitis Create Overview Unprioritized 04/21/2015 Rise Patience, MD Atrial flutter with rapid ventricular response [facility]) Create Overview Unprioritized 06/27/2015 Dixie Dials, MD Aftercare following surgery of the circulatory system, NEC Create Overview Unprioritized 06/22/2013 Eldridge-Lewis, Melba L, RMA Acute respiratory failure with hypoxia [facility]) Create Overview Unprioritized 01/13/2015 Charlynne Cousins, MD Acute diverticulitis Create Overview Unprioritized 04/21/2015 Rise Patience, MD  Elta Gu[place] as Reviewed  Other Problems (April Staton, CMA; 01/06/2016 3:14 PM) Bladder Problems Chest pain Chronic Renal Failure Syndrome Congestive Heart Failure Diabetes Mellitus Gastroesophageal Reflux Disease High blood pressure Kidney Stone  Past Surgical History (April Staton, [place]; 01/06/2016 3:14 PM) Dialysis Shunt / Fistula Nephrectomy Right.  Diagnostic Studies History (April Staton, [place]; 01/06/2016 3:14 PM) Colonoscopy never  Allergies (April Staton, [place]; 01/06/2016 3:15 PM) ACE Inhibitors  Medication History (April Staton, [place]; 01/06/2016 3:17 PM) Oxycodone-Acetaminophen (5-325MG  Tablet, Oral) Active. TraMADol HCl  (50MG  Tablet, Oral) Active. Allopurinol (300MG  Tablet, Oral) Active. Amiodarone HCl (200MG  Tablet, Oral) Active. AmLODIPine Besylate (5MG  Tablet, Oral) Active. Carvedilol (25MG  Tablet, Oral) Active. Clindamycin HCl (150MG  Capsule, Oral) Active. Isosorbide Dinitrate (20MG  Tablet, Oral) Active. Sensipar (90MG  Tablet, Oral) Active. Warfarin Sodium (6MG  Tablet, Oral) Active. Promethazine HCl (25MG  Tablet, Oral) Active. Pantoprazole Sodium (40MG  Tablet DR, Oral) Active. Ondansetron HCl (8MG  Tablet, Oral) Active. Ciprofloxacin  HCl (500MG  Tablet, Oral) Active. Guaifenesin-Codeine (100-10MG /5ML Solution, Oral) Active. Azithromycin (250MG  Tablet, Oral) Active. MetroNIDAZOLE (500MG  Tablet, Oral) Active. Medications Reconciled  Social History (April Staton, CMA; 01/06/2016 3:14 PM) Alcohol use Occasional alcohol use. Caffeine use Coffee, Tea. Illicit drug use Remotely quit drug use. Tobacco use Former smoker.  Family History (April Staton, [place]; 01/06/2016 3:14 PM) Hypertension Daughter, Father, Mother, Sister.     Review of Systems (April Staton CMA; 01/06/2016 3:14 PM) General Present- Appetite Loss. Not Present- Chills, Fatigue, Fever, Night Sweats, Weight Gain and Weight Loss. Skin Not Present- Change in Wart/Mole, Dryness, Hives, Jaundice, New Lesions, Non-Healing Wounds, Rash and Ulcer. HEENT Not Present- Earache, Hearing Loss, Hoarseness, Nose Bleed, Oral Ulcers, Ringing in the Ears, Seasonal Allergies, Sinus Pain, Sore Throat, Visual Disturbances, Wears glasses/contact lenses and Yellow Eyes. Respiratory Present- Difficulty Breathing. Not Present- Bloody sputum, Chronic Cough, Snoring and Wheezing. Cardiovascular Present- Difficulty Breathing Lying Down and Shortness of Breath. Not Present- Chest Pain, Leg Cramps, Palpitations, Rapid Heart Rate and Swelling of Extremities. Male Genitourinary Not Present- Blood in Urine, Change in Urinary Stream, Frequency, Impotence,  Nocturia, Painful Urination, Urgency and Urine Leakage. Musculoskeletal Not Present- Back Pain, Joint Pain, Joint Stiffness, Muscle Pain, Muscle Weakness and Swelling of Extremities. Neurological Present- Trouble walking. Not Present- Decreased Memory, Fainting, Headaches, Numbness, Seizures, Tingling, Tremor and Weakness. Psychiatric Not Present- Anxiety, Bipolar, Change in Sleep Pattern, Depression, Fearful and Frequent crying. Endocrine Not Present- Cold Intolerance, Excessive Hunger, Hair Changes, Heat Intolerance, Hot flashes and New Diabetes. Hematology Not Present- Blood Thinners, Easy Bruising, Excessive bleeding, Gland problems, HIV and Persistent Infections.  Vitals (April Staton CMA; 01/06/2016 3:18 PM) 01/06/2016 3:17 PM Weight: 245.38 lb Height: 73in Body Surface Area: 2.35 m Body Mass Index: 32.37 kg/m  Temp.: 97.40F(Oral)  Pulse: 77 (Regular)  P.OX: 98% (Room air) BP: 100/80 (Sitting, Left Arm, Standard)      Physical Exam Matthew Hollingshead MD; 01/06/2016 3:47 PM)  The physical exam findings are as follows: Note:General: Obese in NAD. Pleasant and cooperative.  HEENT: [place]/AT  EYES: EOMI, no scleral icterus, pupils normal  CV: RRR, no murmur, no edema  CHEST: Breath sounds equal and clear. Respirations nonlabored.  BREASTS: Symmetrical in size. No dominant masses, nipple discharge or suspicious skin lesions.  ABDOMEN: Soft, Mild RUQ tenderness, RUQ scar, periumbilical scars.  MUSCULOSKELETAL: Left arm AV fistula  NEUROLOGIC: Alert and oriented, answers questions appropriately  PSYCHIATRIC: Normal mood, affect , and behavior.    Assessment & Plan Matthew Hollingshead MD; 01/06/2016 3:49 PM)  BILIARY DYSKINESIA (K82.8) Impression: Frequency of symptoms is increasing. We talked about trying a strict low-fat diet versus proceeding with cholecystectomy. We talked about the success rate of cholecystectomy. He would like to proceed with it.  Plan:  Laparoscopic possible open cholecystectomy with IOC. We will need to schedule on a Tuesday or Thursday because of his dialysis schedule. Stop Coumadin 5 days prior to procedure. I have explained the procedure, risks, and aftercare of cholecystectomy. Risks include but are not limited to bleeding, infection, wound problems, anesthesia, diarrhea, bile leak, injury to common bile duct/liver/intestine. He seems to understand and agrees to proceed.  Jackolyn Confer, MD

## 2016-01-07 DIAGNOSIS — N2581 Secondary hyperparathyroidism of renal origin: Secondary | ICD-10-CM | POA: Diagnosis not present

## 2016-01-07 DIAGNOSIS — N186 End stage renal disease: Secondary | ICD-10-CM | POA: Diagnosis not present

## 2016-01-07 DIAGNOSIS — E1129 Type 2 diabetes mellitus with other diabetic kidney complication: Secondary | ICD-10-CM | POA: Diagnosis not present

## 2016-01-09 DIAGNOSIS — E1129 Type 2 diabetes mellitus with other diabetic kidney complication: Secondary | ICD-10-CM | POA: Diagnosis not present

## 2016-01-09 DIAGNOSIS — N186 End stage renal disease: Secondary | ICD-10-CM | POA: Diagnosis not present

## 2016-01-09 DIAGNOSIS — N2581 Secondary hyperparathyroidism of renal origin: Secondary | ICD-10-CM | POA: Diagnosis not present

## 2016-01-12 DIAGNOSIS — N2581 Secondary hyperparathyroidism of renal origin: Secondary | ICD-10-CM | POA: Diagnosis not present

## 2016-01-12 DIAGNOSIS — E1129 Type 2 diabetes mellitus with other diabetic kidney complication: Secondary | ICD-10-CM | POA: Diagnosis not present

## 2016-01-12 DIAGNOSIS — N186 End stage renal disease: Secondary | ICD-10-CM | POA: Diagnosis not present

## 2016-01-13 DIAGNOSIS — Z992 Dependence on renal dialysis: Secondary | ICD-10-CM | POA: Diagnosis not present

## 2016-01-13 DIAGNOSIS — T82898D Other specified complication of vascular prosthetic devices, implants and grafts, subsequent encounter: Secondary | ICD-10-CM | POA: Diagnosis not present

## 2016-01-13 DIAGNOSIS — Z4802 Encounter for removal of sutures: Secondary | ICD-10-CM | POA: Insufficient documentation

## 2016-01-13 DIAGNOSIS — N186 End stage renal disease: Secondary | ICD-10-CM | POA: Diagnosis not present

## 2016-01-13 DIAGNOSIS — I871 Compression of vein: Secondary | ICD-10-CM | POA: Diagnosis not present

## 2016-01-14 DIAGNOSIS — N186 End stage renal disease: Secondary | ICD-10-CM | POA: Diagnosis not present

## 2016-01-14 DIAGNOSIS — E1129 Type 2 diabetes mellitus with other diabetic kidney complication: Secondary | ICD-10-CM | POA: Diagnosis not present

## 2016-01-14 DIAGNOSIS — N2581 Secondary hyperparathyroidism of renal origin: Secondary | ICD-10-CM | POA: Diagnosis not present

## 2016-01-14 DIAGNOSIS — I4892 Unspecified atrial flutter: Secondary | ICD-10-CM | POA: Diagnosis not present

## 2016-01-16 DIAGNOSIS — N186 End stage renal disease: Secondary | ICD-10-CM | POA: Diagnosis not present

## 2016-01-16 DIAGNOSIS — E1129 Type 2 diabetes mellitus with other diabetic kidney complication: Secondary | ICD-10-CM | POA: Diagnosis not present

## 2016-01-16 DIAGNOSIS — N2581 Secondary hyperparathyroidism of renal origin: Secondary | ICD-10-CM | POA: Diagnosis not present

## 2016-01-19 ENCOUNTER — Telehealth: Payer: Self-pay | Admitting: Vascular Surgery

## 2016-01-19 DIAGNOSIS — N2581 Secondary hyperparathyroidism of renal origin: Secondary | ICD-10-CM | POA: Diagnosis not present

## 2016-01-19 DIAGNOSIS — E1129 Type 2 diabetes mellitus with other diabetic kidney complication: Secondary | ICD-10-CM | POA: Diagnosis not present

## 2016-01-19 DIAGNOSIS — N186 End stage renal disease: Secondary | ICD-10-CM | POA: Diagnosis not present

## 2016-01-19 NOTE — Telephone Encounter (Signed)
Sched appt 8/30 at 2:00. Pt's # has no vm, lm w/ dialysis center.

## 2016-01-19 NOTE — Telephone Encounter (Signed)
-----   Message from Denman George, RN sent at 01/19/2016  9:42 AM EDT ----- Regarding: needs earlier appt.  "Ann" from [facility] called.  This pt. Is sched. For appt. 9/14 to eval. On access.  She stated the PA is requesting to move the appt. To an earlier date, due to the risk of rupture.  Please try to work him in this week; he dialyzes on M-W-F early AM (usually done by 10:15-10:30 AM)  Please call the pt. and Ann at the [facility] [place] @ [phone].

## 2016-01-20 ENCOUNTER — Encounter: Payer: Self-pay | Admitting: Vascular Surgery

## 2016-01-21 ENCOUNTER — Encounter: Payer: Self-pay | Admitting: *Deleted

## 2016-01-21 ENCOUNTER — Ambulatory Visit (INDEPENDENT_AMBULATORY_CARE_PROVIDER_SITE_OTHER): Payer: BLUE CROSS/BLUE SHIELD | Admitting: Vascular Surgery

## 2016-01-21 ENCOUNTER — Other Ambulatory Visit: Payer: Self-pay | Admitting: *Deleted

## 2016-01-21 VITALS — BP 82/64 | HR 80 | Temp 98.1°F | Resp 18 | Ht 73.0 in | Wt 240.0 lb

## 2016-01-21 DIAGNOSIS — N2581 Secondary hyperparathyroidism of renal origin: Secondary | ICD-10-CM | POA: Diagnosis not present

## 2016-01-21 DIAGNOSIS — E1129 Type 2 diabetes mellitus with other diabetic kidney complication: Secondary | ICD-10-CM | POA: Diagnosis not present

## 2016-01-21 DIAGNOSIS — N186 End stage renal disease: Secondary | ICD-10-CM | POA: Diagnosis not present

## 2016-01-21 NOTE — H&P (Signed)
Patient ID: [name], male   DOB: [birth year], 55 y.o.   MRN: [id number]  Reason for Consult: New Evaluation (aneurysm resection)   Referred by Donato Heinz, MD  Subjective:     HPI:  [name] is a 55 y.o. male presents probably displaying his [place] hat for evaluation of his left arm AV fistula that has a scab overlying one area of pseudoaneurysm. He does not have bleeding from this site butthis Has gotten much larger. He continues to dialyze Monday Wednesday Friday. This radiocephalic fistula that appears to be from 2012 and has been revised in the near past. He has history of atrial flutter as well as chronic left heart failure and is maintained on Coumadin for these issues. Recently underwent outpatient fistulogram with pta per the patient.  Past Medical History:  Diagnosis Date  . 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 Cardiomyopathy   . CHF (congestive heart failure) ([place])   . Coronary artery disease    normal coronaries by 10/10/08 cath, Cardiac Cath 08-04-12 epic.Dr. Doylene Canard follows  . Diabetes mellitus    pt. states he's borderline diabetic., no longer taking med,- off med. since 2013  . Dialysis patient [facility])    Mon-Wed-Fri([place])- Left AV fistula  . Diverticulitis November 2016   reoccurred in December 2016  . ESRD (end stage renal disease) ([place])   . Gout   . History of nephrectomy 07/04/2012   History of right nephrectomy in 2002 for renal cell carcinoma   . History of unilateral nephrectomy   . Hypertension   . Myocardial infarction [facility]) ?2006  . Renal cell carcinoma    dialysis- MWF- Dr. Mercy Moore follows.  . Renal insufficiency   . Shortness of breath 05/19/11   "at rest, lying down, w/exertion"  . Stroke [facility]) 02/2011   05/19/11 denies residual  . Umbilical hernia [id number]   unrepaired   Family History  Problem Relation Age of Onset  . Hypertension Mother   . Kidney disease Mother    Past  Surgical History:  Procedure Laterality Date  . AV FISTULA PLACEMENT  03/29/2011   Procedure: ARTERIOVENOUS (AV) FISTULA CREATION;  Surgeon: Hinda Lenis, MD;  Location: [place];  Service: Vascular;  Laterality: Left;  LEFT RADIOCEPHALIC , Arteriovenous [id number])  . CARDIAC CATHETERIZATION  05/21/11  . dialysis cath placed    . ESOPHAGOGASTRODUODENOSCOPY (EGD) WITH PROPOFOL N/A 12/02/2015   Procedure: ESOPHAGOGASTRODUODENOSCOPY (EGD) WITH PROPOFOL;  Surgeon: Juanita Craver, MD;  Location: [facility];  Service: Endoscopy;  Laterality: N/A;  . FINGER SURGERY     L pinkie finger- ORIF- /w remaining hardware - 1990's    . HERNIA REPAIR N/A [id number]   Umbilical hernia repair  . INSERTION OF MESH N/A 07/04/2012   Procedure: INSERTION OF MESH;  Surgeon: Madilyn Hook, DO;  Location: [place];  Service: General;  Laterality: N/A;  . LEFT AND RIGHT HEART CATHETERIZATION WITH CORONARY ANGIOGRAM N/A 08/04/2012   Procedure: LEFT AND RIGHT HEART CATHETERIZATION WITH CORONARY ANGIOGRAM;  Surgeon: Birdie Riddle, MD;  Location: [place] CATH LAB;  Service: Cardiovascular;  Laterality: N/A;  . NEPHRECTOMY  2000   right  . REVISON OF ARTERIOVENOUS FISTULA Left 06/05/2013   Procedure: REVISON OF LEFT RADIOCEPHALIC  ARTERIOVENOUS FISTULA;  Surgeon: Conrad Myrtletown, MD;  Location: [place];  Service: Vascular;  Laterality: Left;  . RIGHT HEART CATHETERIZATION N/A 05/21/2011   Procedure: RIGHT HEART CATH;  Surgeon: Lorenda Ishihara  Doylene Canard, MD;  Location: [place] CATH LAB;  Service: Cardiovascular;  Laterality: N/A;  . smashed  1990's   "left pinky; have a plate in there"  . UMBILICAL HERNIA REPAIR N/A 07/04/2012   Procedure: LAPAROSCOPIC UMBILICAL HERNIA;  Surgeon: Madilyn Hook, DO;  Location: [place];  Service: General;  Laterality: N/A;  . US ECHOCARDIOGRAPHY  05/20/11    Short Social History:  Social History  Substance Use Topics  . Smoking status: Never Smoker  . Smokeless tobacco: Never Used  . Alcohol use No    Allergies    Allergen Reactions  . Ace Inhibitors Itching and Cough    Current Outpatient Prescriptions  Medication Sig Dispense Refill  . amiodarone (PACERONE) 200 MG tablet Take 0.5 tablets (100 mg total) by mouth daily. 15 tablet 6  . amLODipine (NORVASC) 10 MG tablet Take 10 mg by mouth daily.     Marland Kitchen b complex-vitamin c-folic acid (NEPHRO-VITE) 0.8 MG TABS tablet Take 1 tablet by mouth daily with supper.     . carvedilol (COREG) 25 MG tablet Take 0.5 tablets (12.5 mg total) by mouth See admin instructions. Take 1/2 tablet (12.5 mg) by mouth in the evening on dialysis (Monday, Wednesday, Friday and 1/2 tablet (12.5 mg) twice daily on Tuesday, Thursday, Saturday, Sunday (non-dialysis days)    . cephALEXin (KEFLEX) 500 MG capsule Take 1 capsule (500 mg total) by mouth daily. 10 capsule 0  . Cholecalciferol (VITAMIN D PO) Take 1 tablet by mouth daily.    . cinacalcet (SENSIPAR) 90 MG tablet Take 90 mg by mouth 4 (four) times a week. Tuesday, Thursday, Saturday, Sunday - non-dialysis days    . ciprofloxacin (CIPRO) 500 MG tablet Take 1 tablet (500 mg total) by mouth every 12 (twelve) hours. 14 tablet 0  . dextromethorphan (DELSYM) 30 MG/5ML liquid Take 10 mLs (60 mg total) by mouth 2 (two) times daily. 89 mL 0  . guaiFENesin-dextromethorphan (ROBITUSSIN DM) 100-10 MG/5ML syrup Take 5 mLs by mouth every 4 (four) hours as needed for cough. 118 mL 0  . isosorbide dinitrate (ISORDIL) 20 MG tablet Take 20 mg by mouth daily.     Marland Kitchen lidocaine-prilocaine (EMLA) cream Apply 1 application topically every Monday, Wednesday, and Friday. Apply to port access prior to dialysis  12  . metroNIDAZOLE (FLAGYL) 500 MG tablet Take 1 tablet (500 mg total) by mouth 3 (three) times daily. 21 tablet 0  . nitroGLYCERIN (NITROSTAT) 0.4 MG SL tablet Place 0.4 mg under the tongue every 5 (five) minutes as needed for chest pain.   0  . oxyCODONE-acetaminophen (PERCOCET/ROXICET) 5-325 MG per tablet Take 1 tablet by mouth at bedtime as  needed for moderate pain. (Patient taking differently: Take 1 tablet by mouth at bedtime as needed (gout pain). ) 20 tablet 0  . pantoprazole (PROTONIX) 40 MG tablet Take 1 tablet (40 mg total) by mouth daily at 12 noon. (Patient taking differently: Take 40 mg by mouth daily. ) 30 tablet 0  . sevelamer carbonate (RENVELA) 800 MG tablet Take 3 tablets (2,400 mg total) by mouth 3 (three) times daily with meals. 270 tablet 3  . warfarin (COUMADIN) 6 MG tablet Take 1 tablet (6 mg total) by mouth daily at 6 PM. (Patient taking differently: Take 3-6 mg by mouth daily at 6 PM. alternating 3 and 6mg  daily) 30 tablet 1   No current facility-administered medications for this visit.     Review of Systems  Constitutional:  Constitutional negative. HENT: HENT negative.  Respiratory:  Respiratory negative.  Cardiovascular: Cardiovascular negative.  GI: Positive for abdominal pain.  Musculoskeletal: Musculoskeletal negative.  Skin:       Scab overlying avf on left wrist Neurological: Neurological negative. Psychiatric: Psychiatric negative.        Objective:  Objective   Vitals:   01/21/16 1411  BP: (!) 82/64  Pulse: 80  Resp: 18  Temp: 98.1 F (36.7 C)  SpO2: 98%  Weight: 240 lb (108.9 kg)  Height: 6\' 1"  (1.854 m)   Body mass index is 31.66 kg/m.  Physical Exam  Constitutional: He appears well-developed.  HENT:  Head: Normocephalic.  Cardiovascular: Normal rate.   Pulmonary/Chest: Effort normal.  Abdominal: Soft.  Musculoskeletal: Normal range of motion.  Skin:  1cm area of scab that is adherent to underlying avf on proximal aspect of avf near wrist  Strong thrill in avf  No palpable R radial pulse         Assessment/Plan:  The 55 year old very pleasant African-American male presents for evaluation of left radiocephalic AV fistula pseudoaneurysm with scab. This time there appears to be very thin layer of skin overlying the fistula and  I believe he is at increased risk for  bleeding. Recently he underwent PTA as an outpatient and the fistula has a great thrill within it. We discussed that this fistula has a risk of rupturing them that if that happens he should hold pressure especially given that he is on Coumadin he is at significant risk of bleeding from this. We will plan to revise this fistula however I have discussed with him that this may require temporary catheter may result in thrombosis of the AV fistula requiring conversion to an upper arm AV fistula and possibly alternative sites for permanent access. He is understandably upset at that prospect however he understands and is willing to proceed. We will plan to revise this fistula with possible tunneled dialysis catheter placement next week as he has any gallbladder surgery in mid September.  Waynetta Sandy MD [facility]

## 2016-01-22 DIAGNOSIS — Z992 Dependence on renal dialysis: Secondary | ICD-10-CM | POA: Diagnosis not present

## 2016-01-22 DIAGNOSIS — N186 End stage renal disease: Secondary | ICD-10-CM | POA: Diagnosis not present

## 2016-01-22 DIAGNOSIS — I120 Hypertensive chronic kidney disease with stage 5 chronic kidney disease or end stage renal disease: Secondary | ICD-10-CM | POA: Diagnosis not present

## 2016-01-23 ENCOUNTER — Encounter (HOSPITAL_COMMUNITY): Payer: Self-pay | Admitting: *Deleted

## 2016-01-23 DIAGNOSIS — E1129 Type 2 diabetes mellitus with other diabetic kidney complication: Secondary | ICD-10-CM | POA: Diagnosis not present

## 2016-01-23 DIAGNOSIS — N2581 Secondary hyperparathyroidism of renal origin: Secondary | ICD-10-CM | POA: Diagnosis not present

## 2016-01-23 DIAGNOSIS — N186 End stage renal disease: Secondary | ICD-10-CM | POA: Diagnosis not present

## 2016-01-23 NOTE — Progress Notes (Signed)
Anesthesia Chart Review:  Pt is a same day work up  Pt is a 55 year old male scheduled for revision of upper extremity L AV fistula, insertion of dialysis catheter on 01/27/2016 with Servando Snare, MD.   Cardiologist is Dixie Dials, MD  PMH includes:  CAD (mild by 2014 cath), CHF, nonischemic cardiomyopathy, atrial flutter (new onset 06/2015), HTN, renal cell carcinoma (s/p R nephrectomy 2002), ESRD on dialysis, stroke (2012), DM. Never smoker. BMI 32  Medications include: amiodarone, amlodipine, carvediolol, isordil, protonix, coumadin. Last coumadin 01/23/16.   Labs will be obtained DOS.   CXR 09/07/15: Cardiomegaly and pulmonary vascular redistribution with mild interstitial pulmonary edema. Left basilar opacities favored to represent atelectasis.  EKG 09/07/15: NSR. Biatrial enlargement. ST & T wave abnormality, consider lateral ischemia. Prolonged QT. T wave abnormality present on and off dating back to at least 2013.   Echo 06/29/15:  - Left ventricle: The cavity size was severely dilated. There was moderate concentric hypertrophy. Systolic function was severely reduced. The estimated ejection fraction was in the range of 20% to 25%. Diffuse hypokinesis. - Aortic valve: There was trivial regurgitation. - Mitral valve: There was moderate regurgitation. - Left atrium: The atrium was severely dilated. - Right ventricle: The cavity size was mildly dilated. Wall thickness was normal. Systolic function was mildly to moderately reduced. - Right atrium: The atrium was moderately dilated. - Tricuspid valve: There was moderate regurgitation. - Pulmonary arteries: Systolic pressure was mildly to moderately increased. PA peak pressure: 52 mm Hg (S).  Nuclear stress test 01/16/15:  1. Moderate-size fixed defect at the base of the heart, mid day apex. 2. Dilated ventricle with global hypokinesis. 3. Left ventricular ejection fraction 23% 4. High-risk stress test findings -- Discharge summary 01/16/15  indicates these results were discussed with Dr. Doylene Canard who felt they were unchanged from prior stress tests  Carotid duplex 10/06/12: No significant extracranial carotid artery stenosis demonstrated.  Cardiac cath 08/04/12:  1. Normal left main coronary artery. 2. Mild disease of left anterior descending artery and its branches. 3. Normal left circumflex artery and its branches. 4. Normal right coronary artery. 5. Dilated LV with moderate left ventricular systolic dysfunction.  LVEDP 19 mmHg.  Ejection fraction 30-40%.  Pt's cardiomyopathy is long-standing. Stress test results from 2004 report his EF was 12% at that time. Oldest echo in [facility] records dated 10/07/09 notes EF was 25-30% (and was in this range on echo 05/20/11, 10/06/12, and 01/14/15.) Most recent echo 06/29/15 shows EF was 20-25%. Pt reports he was recently seen by Dr. Doylene Canard and he denies chest pain or SOB. Dr. Merrilee Jansky office is closed today and we are unable to obtain most recent notes.   Reviewed case with Dr. Al Corpus. Pt will need further assessment by assigned anesthesiologist DOS.   Willeen Cass, FNP-BC [facility]/Anesthesiology Phone: [phone] 01/23/2016 3:39 PM

## 2016-01-23 NOTE — Progress Notes (Signed)
Mr Simard denies chest pain or shortness of breath. Cardiologist is Dr Doylene Canard. Patient reported that he has seen Dr Doylene Canard in the past month and no change was made to his treatment. Dr Merrilee Jansky office is closed until Tuesday.

## 2016-01-27 ENCOUNTER — Ambulatory Visit (HOSPITAL_COMMUNITY)
Admission: RE | Admit: 2016-01-27 | Discharge: 2016-01-27 | Disposition: A | Discharge status: Home / Self Care | Payer: Medicare Other | Source: Ambulatory Visit | Attending: Vascular Surgery | Admitting: Vascular Surgery

## 2016-01-27 ENCOUNTER — Encounter (HOSPITAL_COMMUNITY)
Admission: RE | Disposition: A | Discharge status: Home / Self Care | Payer: Self-pay | Source: Ambulatory Visit | Attending: Vascular Surgery

## 2016-01-27 ENCOUNTER — Ambulatory Visit (HOSPITAL_COMMUNITY): Payer: Medicare Other | Admitting: Emergency Medicine

## 2016-01-27 ENCOUNTER — Encounter (HOSPITAL_COMMUNITY): Payer: Self-pay | Admitting: Certified Registered Nurse Anesthetist

## 2016-01-27 DIAGNOSIS — T82898A Other specified complication of vascular prosthetic devices, implants and grafts, initial encounter: Secondary | ICD-10-CM | POA: Insufficient documentation

## 2016-01-27 DIAGNOSIS — Z905 Acquired absence of kidney: Secondary | ICD-10-CM | POA: Diagnosis not present

## 2016-01-27 DIAGNOSIS — Z8673 Personal history of transient ischemic attack (TIA), and cerebral infarction without residual deficits: Secondary | ICD-10-CM | POA: Insufficient documentation

## 2016-01-27 DIAGNOSIS — Y832 Surgical operation with anastomosis, bypass or graft as the cause of abnormal reaction of the patient, or of later complication, without mention of misadventure at the time of the procedure: Secondary | ICD-10-CM | POA: Insufficient documentation

## 2016-01-27 DIAGNOSIS — Z992 Dependence on renal dialysis: Secondary | ICD-10-CM | POA: Insufficient documentation

## 2016-01-27 DIAGNOSIS — I2510 Atherosclerotic heart disease of native coronary artery without angina pectoris: Secondary | ICD-10-CM | POA: Diagnosis not present

## 2016-01-27 DIAGNOSIS — N189 Chronic kidney disease, unspecified: Secondary | ICD-10-CM | POA: Diagnosis not present

## 2016-01-27 DIAGNOSIS — I132 Hypertensive heart and chronic kidney disease with heart failure and with stage 5 chronic kidney disease, or end stage renal disease: Secondary | ICD-10-CM | POA: Diagnosis not present

## 2016-01-27 DIAGNOSIS — I429 Cardiomyopathy, unspecified: Secondary | ICD-10-CM | POA: Diagnosis not present

## 2016-01-27 DIAGNOSIS — Z85528 Personal history of other malignant neoplasm of kidney: Secondary | ICD-10-CM | POA: Insufficient documentation

## 2016-01-27 DIAGNOSIS — I509 Heart failure, unspecified: Secondary | ICD-10-CM | POA: Insufficient documentation

## 2016-01-27 DIAGNOSIS — E1122 Type 2 diabetes mellitus with diabetic chronic kidney disease: Secondary | ICD-10-CM | POA: Diagnosis not present

## 2016-01-27 HISTORY — PX: REVISON OF ARTERIOVENOUS FISTULA: SHX6074

## 2016-01-27 HISTORY — DX: Unspecified atrial flutter: I48.92

## 2016-01-27 HISTORY — PX: INSERTION OF DIALYSIS CATHETER: SHX1324

## 2016-01-27 LAB — GLUCOSE, CAPILLARY: Glucose-Capillary: 99 mg/dL (ref 65–99)

## 2016-01-27 LAB — PROTIME-INR
INR: 1.03
Prothrombin Time: 13.5 seconds (ref 11.4–15.2)

## 2016-01-27 LAB — POCT I-STAT 4, (NA,K, GLUC, HGB,HCT)
Glucose, Bld: 92 mg/dL (ref 65–99)
HCT: 41 % (ref 39.0–52.0)
Hemoglobin: 13.9 g/dL (ref 13.0–17.0)
Potassium: 5.1 mmol/L (ref 3.5–5.1)
Sodium: 135 mmol/L (ref 135–145)

## 2016-01-27 LAB — APTT: aPTT: 33 seconds (ref 24–36)

## 2016-01-27 SURGERY — REVISON OF ARTERIOVENOUS FISTULA
Anesthesia: General | Site: Arm Lower

## 2016-01-27 MED ORDER — FENTANYL CITRATE (PF) 100 MCG/2ML IJ SOLN
INTRAMUSCULAR | Status: DC | PRN
  Administered 2016-01-27: 50 ug via INTRAVENOUS
  Administered 2016-01-27: 25 ug via INTRAVENOUS

## 2016-01-27 MED ORDER — DEXTROSE 5 % IV SOLN
1.5000 g | INTRAVENOUS | Status: AC
  Administered 2016-01-27: 1.5 g via INTRAVENOUS
  Filled 2016-01-27: qty 1.5

## 2016-01-27 MED ORDER — WARFARIN SODIUM 6 MG PO TABS: 9.0000 mg | ORAL_TABLET | Freq: Every day | ORAL | 1 refills | Status: DC

## 2016-01-27 MED ORDER — OXYCODONE HCL 5 MG PO TABS: 5.0000 mg | ORAL_TABLET | Freq: Once | ORAL | Status: DC | PRN

## 2016-01-27 MED ORDER — PHENYLEPHRINE HCL 10 MG/ML IJ SOLN
INTRAMUSCULAR | Status: DC | PRN
  Administered 2016-01-27: 80 ug via INTRAVENOUS
  Administered 2016-01-27: 120 ug via INTRAVENOUS
  Administered 2016-01-27: 80 ug via INTRAVENOUS
  Administered 2016-01-27: 120 ug via INTRAVENOUS

## 2016-01-27 MED ORDER — HEPARIN SODIUM (PORCINE) 1000 UNIT/ML IJ SOLN
INTRAMUSCULAR | Status: DC | PRN
  Administered 2016-01-27: 5 mL via INTRAVENOUS

## 2016-01-27 MED ORDER — PROPOFOL 10 MG/ML IV BOLUS
INTRAVENOUS | Status: AC
  Filled 2016-01-27: qty 20

## 2016-01-27 MED ORDER — LIDOCAINE-EPINEPHRINE (PF) 1 %-1:200000 IJ SOLN
INTRAMUSCULAR | Status: AC
  Filled 2016-01-27: qty 30

## 2016-01-27 MED ORDER — ONDANSETRON HCL 4 MG/2ML IJ SOLN
INTRAMUSCULAR | Status: DC | PRN
  Administered 2016-01-27: 4 mg via INTRAVENOUS

## 2016-01-27 MED ORDER — EPHEDRINE SULFATE 50 MG/ML IJ SOLN
INTRAMUSCULAR | Status: DC | PRN
  Administered 2016-01-27 (×2): 10 mg via INTRAVENOUS
  Administered 2016-01-27: 20 mg via INTRAVENOUS

## 2016-01-27 MED ORDER — FENTANYL CITRATE (PF) 100 MCG/2ML IJ SOLN
INTRAMUSCULAR | Status: AC
  Filled 2016-01-27: qty 4

## 2016-01-27 MED ORDER — CHLORHEXIDINE GLUCONATE CLOTH 2 % EX PADS: 6.0000 | MEDICATED_PAD | Freq: Once | CUTANEOUS | Status: DC

## 2016-01-27 MED ORDER — EPHEDRINE 5 MG/ML INJ
INTRAVENOUS | Status: AC
  Filled 2016-01-27: qty 10

## 2016-01-27 MED ORDER — PROPOFOL 10 MG/ML IV BOLUS
INTRAVENOUS | Status: DC | PRN
  Administered 2016-01-27: 120 mg via INTRAVENOUS
  Administered 2016-01-27: 50 mg via INTRAVENOUS

## 2016-01-27 MED ORDER — PHENYLEPHRINE 40 MCG/ML (10ML) SYRINGE FOR IV PUSH (FOR BLOOD PRESSURE SUPPORT)
PREFILLED_SYRINGE | INTRAVENOUS | Status: AC
  Filled 2016-01-27: qty 10

## 2016-01-27 MED ORDER — SODIUM CHLORIDE 0.9 % IV SOLN
INTRAVENOUS | Status: DC
  Administered 2016-01-27 (×2): via INTRAVENOUS

## 2016-01-27 MED ORDER — FENTANYL CITRATE (PF) 100 MCG/2ML IJ SOLN: 25.0000 ug | INTRAMUSCULAR | Status: DC | PRN

## 2016-01-27 MED ORDER — ONDANSETRON HCL 4 MG/2ML IJ SOLN: 4.0000 mg | Freq: Four times a day (QID) | INTRAMUSCULAR | Status: DC | PRN

## 2016-01-27 MED ORDER — LIDOCAINE 2% (20 MG/ML) 5 ML SYRINGE
INTRAMUSCULAR | Status: AC
  Filled 2016-01-27: qty 5

## 2016-01-27 MED ORDER — LIDOCAINE 2% (20 MG/ML) 5 ML SYRINGE
INTRAMUSCULAR | Status: DC | PRN
  Administered 2016-01-27: 60 mg via INTRAVENOUS

## 2016-01-27 MED ORDER — OXYCODONE-ACETAMINOPHEN 5-325 MG PO TABS: 1.0000 | ORAL_TABLET | Freq: Four times a day (QID) | ORAL | 0 refills | Status: DC | PRN

## 2016-01-27 MED ORDER — ONDANSETRON HCL 4 MG/2ML IJ SOLN
INTRAMUSCULAR | Status: AC
  Filled 2016-01-27: qty 2

## 2016-01-27 MED ORDER — PANTOPRAZOLE SODIUM 40 MG PO TBEC: 40.0000 mg | DELAYED_RELEASE_TABLET | Freq: Two times a day (BID) | ORAL | 1 refills | Status: DC

## 2016-01-27 MED ORDER — PHENYLEPHRINE HCL 10 MG/ML IJ SOLN
INTRAVENOUS | Status: DC | PRN
  Administered 2016-01-27: 25 ug/min via INTRAVENOUS

## 2016-01-27 MED ORDER — SODIUM CHLORIDE 0.9 % IV SOLN
INTRAVENOUS | Status: DC | PRN
  Administered 2016-01-27: 08:00:00

## 2016-01-27 MED ORDER — HEPARIN SODIUM (PORCINE) 1000 UNIT/ML IJ SOLN
INTRAMUSCULAR | Status: AC
  Filled 2016-01-27: qty 1

## 2016-01-27 MED ORDER — 0.9 % SODIUM CHLORIDE (POUR BTL) OPTIME
TOPICAL | Status: DC | PRN
  Administered 2016-01-27: 1000 mL

## 2016-01-27 MED ORDER — OXYCODONE HCL 5 MG/5ML PO SOLN: 5.0000 mg | Freq: Once | ORAL | Status: DC | PRN

## 2016-01-27 MED ORDER — MIDAZOLAM HCL 2 MG/2ML IJ SOLN
INTRAMUSCULAR | Status: AC
  Filled 2016-01-27: qty 2

## 2016-01-27 SURGICAL SUPPLY — 50 items
ARMBAND PINK RESTRICT EXTREMIT (MISCELLANEOUS) ×3 IMPLANT
BAG DECANTER FOR FLEXI CONT (MISCELLANEOUS) ×3 IMPLANT
BIOPATCH RED 1 DISK 7.0 (GAUZE/BANDAGES/DRESSINGS) ×3 IMPLANT
CANISTER SUCTION 2500CC (MISCELLANEOUS) ×3 IMPLANT
CATH PALINDROME RT-P 15FX19CM (CATHETERS) IMPLANT
CATH PALINDROME RT-P 15FX23CM (CATHETERS) IMPLANT
CATH PALINDROME RT-P 15FX28CM (CATHETERS) IMPLANT
CATH PALINDROME RT-P 15FX55CM (CATHETERS) IMPLANT
CLIP TI MEDIUM 6 (CLIP) ×3 IMPLANT
CLIP TI WIDE RED SMALL 6 (CLIP) ×3 IMPLANT
COVER PROBE W GEL 5X96 (DRAPES) ×3 IMPLANT
DRAPE C-ARM 42X72 X-RAY (DRAPES) ×3 IMPLANT
DRAPE CHEST BREAST 15X10 FENES (DRAPES) ×3 IMPLANT
ELECT REM PT RETURN 9FT ADLT (ELECTROSURGICAL) ×3
ELECTRODE REM PT RTRN 9FT ADLT (ELECTROSURGICAL) ×2 IMPLANT
GAUZE SPONGE 2X2 8PLY STRL LF (GAUZE/BANDAGES/DRESSINGS) IMPLANT
GAUZE SPONGE 4X4 16PLY XRAY LF (GAUZE/BANDAGES/DRESSINGS) ×3 IMPLANT
GLOVE BIO SURGEON STRL SZ 6.5 (GLOVE) ×3 IMPLANT
GLOVE BIO SURGEON STRL SZ7.5 (GLOVE) ×3 IMPLANT
GLOVE BIOGEL PI IND STRL 6.5 (GLOVE) IMPLANT
GLOVE BIOGEL PI INDICATOR 6.5 (GLOVE) ×2
GOWN STRL REUS W/ TWL LRG LVL3 (GOWN DISPOSABLE) ×4 IMPLANT
GOWN STRL REUS W/ TWL XL LVL3 (GOWN DISPOSABLE) ×2 IMPLANT
GOWN STRL REUS W/TWL LRG LVL3 (GOWN DISPOSABLE) ×6
GOWN STRL REUS W/TWL XL LVL3 (GOWN DISPOSABLE) ×3
KIT BASIN OR (CUSTOM PROCEDURE TRAY) ×3 IMPLANT
KIT ROOM TURNOVER OR (KITS) ×3 IMPLANT
LIQUID BAND (GAUZE/BANDAGES/DRESSINGS) ×3 IMPLANT
NDL 18GX1X1/2 (RX/OR ONLY) (NEEDLE) ×2 IMPLANT
NDL HYPO 25GX1X1/2 BEV (NEEDLE) ×2 IMPLANT
NEEDLE 18GX1X1/2 (RX/OR ONLY) (NEEDLE) ×3 IMPLANT
NEEDLE HYPO 25GX1X1/2 BEV (NEEDLE) ×3 IMPLANT
NS IRRIG 1000ML POUR BTL (IV SOLUTION) ×3 IMPLANT
PACK CV ACCESS (CUSTOM PROCEDURE TRAY) ×3 IMPLANT
PACK SURGICAL SETUP 50X90 (CUSTOM PROCEDURE TRAY) ×3 IMPLANT
PAD ARMBOARD 7.5X6 YLW CONV (MISCELLANEOUS) ×6 IMPLANT
SOAP 2 % CHG 4 OZ (WOUND CARE) ×3 IMPLANT
SPONGE GAUZE 2X2 STER 10/PKG (GAUZE/BANDAGES/DRESSINGS)
SUT ETHILON 3 0 PS 1 (SUTURE) ×3 IMPLANT
SUT MNCRL AB 4-0 PS2 18 (SUTURE) ×3 IMPLANT
SUT PROLENE 4 0 SH DA (SUTURE) ×1 IMPLANT
SUT PROLENE 6 0 BV (SUTURE) ×3 IMPLANT
SUT VIC AB 3-0 SH 27 (SUTURE) ×3
SUT VIC AB 3-0 SH 27X BRD (SUTURE) ×2 IMPLANT
SYR 20CC LL (SYRINGE) ×6 IMPLANT
SYR 5ML LL (SYRINGE) ×3 IMPLANT
SYR CONTROL 10ML LL (SYRINGE) ×3 IMPLANT
SYRINGE 10CC LL (SYRINGE) ×3 IMPLANT
UNDERPAD 30X30 (UNDERPADS AND DIAPERS) ×3 IMPLANT
WATER STERILE IRR 1000ML POUR (IV SOLUTION) ×3 IMPLANT

## 2016-01-27 NOTE — H&P (Signed)
     History and Physical Update  The patient was interviewed and re-examined.  The patient's previous History and Physical has been reviewed and is unchanged.  There is no change in the plan of care and will proceed with left forearm av fistula revision with possible tdc.   Romesha Scherer C. Donzetta Matters, MD [facility] [place] Office: [phone] Pager: [phone]   01/27/2016, 7:40 AM

## 2016-01-27 NOTE — Op Note (Signed)
    Patient name: [name] MRN: [id number] DOB: January 29, 1961 Sex: male  01/27/2016 Pre-operative Diagnosis: ulceration of left arm av fistula Post-operative diagnosis:  Same Surgeon:  Servando Snare, MD Assistant: Virgina Jock, PA Procedure Performed: 1.  Revision of left upper extremity av fistula  Indications:  55yo AAM dialyzes via left arm av fistula. He has an ulceration that has had minimal bleeding in the past and is therefore indicated for revision.  Findings: There was ulceration of the middle aspect of the skin of the [name] fistula that did not communicate to the fistula itself. The skin was excised and closed primarily.  Procedure:  The patient was identified taking operating room placed supine on the operating table and general anesthesia induced. He was given antibiotics sterilely prepped and draped in usual fashion timeout called. We instilled 5000 units of heparin and inflated a tourniquet just below the antecubitum. We then made a transverse elliptical incision to remove the ulceration identified the fistula below and excised the skin.  The fistula did not seem violated I then allowed the tourniquet to release with a total inflation time of 3 minutes. I then oversewed the fistula with plication sutures of 4-0 Prolene. Hemostasis was obtained and the wound closed primarily with 4-0 Monocryl and Dermabond placed above that. Patient was allowed awaken from anesthesia having tolerated the procedure well plans for transfer to PACU discharge home.    Brandon C. Donzetta Matters, MD [facility] [place] Office: [phone] Pager: [phone]

## 2016-01-27 NOTE — Transfer of Care (Signed)
Immediate Anesthesia Transfer of Care Note  Patient: [name]  Procedure(s) Performed: Procedure(s): REVISION OF LEFT UPPER EXTREMITY ARTERIOVENOUS FISTULA (Left) INSERTION OF DIALYSIS CATHETER (N/A)  Patient Location: PACU  Anesthesia Type:General  Level of Consciousness: patient cooperative and responds to stimulation  Airway & Oxygen Therapy: Patient Spontanous Breathing and Patient connected to face mask oxygen  Post-op Assessment: Report given to RN and Post -op Vital signs reviewed and stable  Post vital signs: Reviewed and stable  Last Vitals:  Vitals:   01/27/16 0618 01/27/16 0821  BP: (!) 91/57 (!) 112/91  Pulse: 74 62  Resp: 18 (!) 23  Temp: 36.8 C     Last Pain:  Vitals:   01/27/16 0703  TempSrc:   PainSc: 0-No pain      Patients Stated Pain Goal: 3 ([id number])  Complications: No apparent anesthesia complications

## 2016-01-27 NOTE — Anesthesia Preprocedure Evaluation (Signed)
Anesthesia Evaluation  Patient identified by MRN, date of birth, ID band Patient awake    Reviewed: Allergy & Precautions, H&P , NPO status , Patient's Chart, lab work & pertinent test results  Airway Mallampati: II   Neck ROM: full    Dental   Pulmonary shortness of breath,    breath sounds clear to auscultation       Cardiovascular hypertension, + CAD, + Past MI and +CHF   Rhythm:regular Rate:Normal  EF 25%.  Mild CAD on cath.   Neuro/Psych CVA    GI/Hepatic   Endo/Other  diabetes, Type 2  Renal/GU ESRF and DialysisRenal disease     Musculoskeletal   Abdominal   Peds  Hematology   Anesthesia Other Findings   Reproductive/Obstetrics                             Anesthesia Physical Anesthesia Plan  ASA: III  Anesthesia Plan: General   Post-op Pain Management:    Induction: Intravenous  Airway Management Planned: LMA  Additional Equipment:   Intra-op Plan:   Post-operative Plan:   Informed Consent: I have reviewed the patients History and Physical, chart, labs and discussed the procedure including the risks, benefits and alternatives for the proposed anesthesia with the patient or authorized representative who has indicated his/her understanding and acceptance.     Plan Discussed with: CRNA, Anesthesiologist and Surgeon  Anesthesia Plan Comments:         Anesthesia Quick Evaluation

## 2016-01-28 ENCOUNTER — Encounter (HOSPITAL_COMMUNITY): Payer: Self-pay | Admitting: Vascular Surgery

## 2016-01-28 NOTE — Anesthesia Postprocedure Evaluation (Signed)
Anesthesia Post Note  Patient: [name]  Procedure(s) Performed: Procedure(s) (LRB): REVISION OF LEFT UPPER EXTREMITY ARTERIOVENOUS FISTULA (Left) INSERTION OF DIALYSIS CATHETER (N/A)  Patient location during evaluation: PACU Anesthesia Type: General Level of consciousness: awake and alert and patient cooperative Pain management: pain level controlled Vital Signs Assessment: post-procedure vital signs reviewed and stable Respiratory status: spontaneous breathing and respiratory function stable Cardiovascular status: stable Anesthetic complications: no    Last Vitals:  Vitals:   01/27/16 0845 01/27/16 0852  BP: 109/70 109/72  Pulse: 65 64  Resp: 17   Temp: 36.5 C     Last Pain:  Vitals:   01/27/16 0703  TempSrc:   PainSc: 0-No pain                 [name]

## 2016-01-29 ENCOUNTER — Telehealth: Payer: Self-pay | Admitting: Vascular Surgery

## 2016-01-29 NOTE — Telephone Encounter (Signed)
Pt aware of appt on 02/13/16 for Post Op, he is having Gallbladder surgery 02/10/16 he may need to reschedule. 01/29/16 BEG

## 2016-01-29 NOTE — Telephone Encounter (Signed)
-----   Message from Denman George, RN sent at 01/27/2016 12:59 PM EDT ----- Regarding: needs 2 week f/u with Dr. Donzetta Matters; no studies   ----- Message ----- From: Alvia Grove, PA-C Sent: 01/27/2016   8:04 AM To: Vvs Charge Pool  S/p plication left forearm AVF 01/27/16  F/u with [facility] in 2 weeks. No studies.   Thanks Maudie Mercury

## 2016-02-05 ENCOUNTER — Ambulatory Visit: Payer: Medicare Other | Admitting: Vascular Surgery

## 2016-02-09 ENCOUNTER — Encounter: Payer: Self-pay | Admitting: Vascular Surgery

## 2016-02-11 ENCOUNTER — Encounter (HOSPITAL_COMMUNITY): Payer: Self-pay | Admitting: *Deleted

## 2016-02-11 MED ORDER — CEFAZOLIN SODIUM-DEXTROSE 2-4 GM/100ML-% IV SOLN
2.0000 g | INTRAVENOUS | Status: AC
  Administered 2016-02-12: 2 g via INTRAVENOUS
  Filled 2016-02-11: qty 100

## 2016-02-11 NOTE — Progress Notes (Signed)
Pt has cardiac history, sees Dr. Doylene Canard. Pt denies chest pain or sob. Pt was instructed to stop Coumadin, last dose was 02/05/16. Have requested last office visit notes from Dr. Doylene Canard.

## 2016-02-12 ENCOUNTER — Encounter (HOSPITAL_COMMUNITY)
Admission: RE | Disposition: A | Discharge status: Home / Self Care | Payer: Self-pay | Source: Ambulatory Visit | Attending: General Surgery

## 2016-02-12 ENCOUNTER — Encounter (HOSPITAL_COMMUNITY): Payer: Self-pay | Admitting: Surgery

## 2016-02-12 ENCOUNTER — Ambulatory Visit (HOSPITAL_COMMUNITY): Payer: Medicare Other | Admitting: Certified Registered Nurse Anesthetist

## 2016-02-12 ENCOUNTER — Observation Stay (HOSPITAL_COMMUNITY)
Admission: RE | Admit: 2016-02-12 | Discharge: 2016-02-13 | Disposition: A | Discharge status: Home / Self Care | Payer: Medicare Other | Source: Ambulatory Visit | Attending: General Surgery | Admitting: General Surgery

## 2016-02-12 ENCOUNTER — Ambulatory Visit (HOSPITAL_COMMUNITY): Payer: Medicare Other

## 2016-02-12 DIAGNOSIS — Z87891 Personal history of nicotine dependence: Secondary | ICD-10-CM | POA: Insufficient documentation

## 2016-02-12 DIAGNOSIS — M109 Gout, unspecified: Secondary | ICD-10-CM | POA: Insufficient documentation

## 2016-02-12 DIAGNOSIS — I509 Heart failure, unspecified: Secondary | ICD-10-CM | POA: Diagnosis not present

## 2016-02-12 DIAGNOSIS — K219 Gastro-esophageal reflux disease without esophagitis: Secondary | ICD-10-CM | POA: Diagnosis not present

## 2016-02-12 DIAGNOSIS — I132 Hypertensive heart and chronic kidney disease with heart failure and with stage 5 chronic kidney disease, or end stage renal disease: Secondary | ICD-10-CM | POA: Insufficient documentation

## 2016-02-12 DIAGNOSIS — Z8673 Personal history of transient ischemic attack (TIA), and cerebral infarction without residual deficits: Secondary | ICD-10-CM | POA: Diagnosis not present

## 2016-02-12 DIAGNOSIS — I429 Cardiomyopathy, unspecified: Secondary | ICD-10-CM | POA: Insufficient documentation

## 2016-02-12 DIAGNOSIS — I2510 Atherosclerotic heart disease of native coronary artery without angina pectoris: Secondary | ICD-10-CM | POA: Diagnosis not present

## 2016-02-12 DIAGNOSIS — K660 Peritoneal adhesions (postprocedural) (postinfection): Secondary | ICD-10-CM | POA: Diagnosis not present

## 2016-02-12 DIAGNOSIS — Z85528 Personal history of other malignant neoplasm of kidney: Secondary | ICD-10-CM | POA: Insufficient documentation

## 2016-02-12 DIAGNOSIS — E1122 Type 2 diabetes mellitus with diabetic chronic kidney disease: Secondary | ICD-10-CM | POA: Insufficient documentation

## 2016-02-12 DIAGNOSIS — I5022 Chronic systolic (congestive) heart failure: Secondary | ICD-10-CM | POA: Insufficient documentation

## 2016-02-12 DIAGNOSIS — K828 Other specified diseases of gallbladder: Principal | ICD-10-CM | POA: Insufficient documentation

## 2016-02-12 DIAGNOSIS — K811 Chronic cholecystitis: Secondary | ICD-10-CM | POA: Insufficient documentation

## 2016-02-12 DIAGNOSIS — Z992 Dependence on renal dialysis: Secondary | ICD-10-CM | POA: Diagnosis not present

## 2016-02-12 DIAGNOSIS — I4892 Unspecified atrial flutter: Secondary | ICD-10-CM | POA: Insufficient documentation

## 2016-02-12 DIAGNOSIS — N186 End stage renal disease: Secondary | ICD-10-CM | POA: Diagnosis not present

## 2016-02-12 DIAGNOSIS — Z7901 Long term (current) use of anticoagulants: Secondary | ICD-10-CM | POA: Insufficient documentation

## 2016-02-12 DIAGNOSIS — Z905 Acquired absence of kidney: Secondary | ICD-10-CM | POA: Insufficient documentation

## 2016-02-12 DIAGNOSIS — I252 Old myocardial infarction: Secondary | ICD-10-CM | POA: Insufficient documentation

## 2016-02-12 DIAGNOSIS — Z79899 Other long term (current) drug therapy: Secondary | ICD-10-CM | POA: Diagnosis not present

## 2016-02-12 HISTORY — DX: Iron deficiency: E61.1

## 2016-02-12 HISTORY — DX: Other specified diseases of gallbladder: K82.8

## 2016-02-12 HISTORY — DX: Gastro-esophageal reflux disease without esophagitis: K21.9

## 2016-02-12 HISTORY — PX: LAPAROSCOPIC LYSIS OF ADHESIONS: SHX5905

## 2016-02-12 HISTORY — PX: CHOLECYSTECTOMY: SHX55

## 2016-02-12 LAB — GLUCOSE, CAPILLARY
Glucose-Capillary: 79 mg/dL (ref 65–99)
Glucose-Capillary: 59 mg/dL — ABNORMAL LOW (ref 65–99)
Glucose-Capillary: 96 mg/dL (ref 65–99)
Glucose-Capillary: 118 mg/dL — ABNORMAL HIGH (ref 65–99)
Glucose-Capillary: 79 mg/dL (ref 65–99)

## 2016-02-12 LAB — CBC WITH DIFFERENTIAL/PLATELET
Basophils Absolute: 0 10*3/uL (ref 0.0–0.1)
Basophils Relative: 0 %
Eosinophils Absolute: 0.1 10*3/uL (ref 0.0–0.7)
Eosinophils Relative: 2 %
HCT: 40.8 % (ref 39.0–52.0)
Hemoglobin: 13 g/dL (ref 13.0–17.0)
Lymphocytes Relative: 23 %
Lymphs Abs: 1 10*3/uL (ref 0.7–4.0)
MCH: 32.2 pg (ref 26.0–34.0)
MCHC: 31.9 g/dL (ref 30.0–36.0)
MCV: 101 fL — ABNORMAL HIGH (ref 78.0–100.0)
Monocytes Absolute: 0.5 10*3/uL (ref 0.1–1.0)
Monocytes Relative: 11 %
Neutro Abs: 2.8 10*3/uL (ref 1.7–7.7)
Neutrophils Relative %: 64 %
Platelets: 181 10*3/uL (ref 150–400)
RBC: 4.04 MIL/uL — ABNORMAL LOW (ref 4.22–5.81)
RDW: 15.1 % (ref 11.5–15.5)
WBC: 4.3 10*3/uL (ref 4.0–10.5)

## 2016-02-12 LAB — COMPREHENSIVE METABOLIC PANEL
ALT: 13 U/L — ABNORMAL LOW (ref 17–63)
AST: 17 U/L (ref 15–41)
Albumin: 3.3 g/dL — ABNORMAL LOW (ref 3.5–5.0)
Alkaline Phosphatase: 60 U/L (ref 38–126)
Anion gap: 9 (ref 5–15)
BUN: 21 mg/dL — ABNORMAL HIGH (ref 6–20)
CO2: 28 mmol/L (ref 22–32)
Calcium: 9.7 mg/dL (ref 8.9–10.3)
Chloride: 100 mmol/L — ABNORMAL LOW (ref 101–111)
Creatinine, Ser: 10.34 mg/dL — ABNORMAL HIGH (ref 0.61–1.24)
GFR calc Af Amer: 6 mL/min — ABNORMAL LOW (ref >60)
GFR calc non Af Amer: 5 mL/min — ABNORMAL LOW (ref >60)
Glucose, Bld: 86 mg/dL (ref 65–99)
Potassium: 5.6 mmol/L — ABNORMAL HIGH (ref 3.5–5.1)
Sodium: 137 mmol/L (ref 135–145)
Total Bilirubin: 0.9 mg/dL (ref 0.3–1.2)
Total Protein: 7.6 g/dL (ref 6.5–8.1)

## 2016-02-12 LAB — PROTIME-INR
INR: 1
Prothrombin Time: 13.2 seconds (ref 11.4–15.2)

## 2016-02-12 SURGERY — LAPAROSCOPIC CHOLECYSTECTOMY WITH INTRAOPERATIVE CHOLANGIOGRAM
Anesthesia: General | Site: Abdomen

## 2016-02-12 MED ORDER — MORPHINE SULFATE (PF) 2 MG/ML IV SOLN
2.0000 mg | INTRAVENOUS | Status: DC | PRN
Start: 2016-02-12 — End: 2016-02-13
  Administered 2016-02-12 (×2): 2 mg via INTRAVENOUS
  Administered 2016-02-13 (×3): 4 mg via INTRAVENOUS
  Filled 2016-02-12 (×4): qty 2

## 2016-02-12 MED ORDER — CARVEDILOL 12.5 MG PO TABS
12.5000 mg | ORAL_TABLET | ORAL | Status: DC
  Administered 2016-02-12: 12.5 mg via ORAL
  Filled 2016-02-12: qty 1

## 2016-02-12 MED ORDER — LIDOCAINE 2% (20 MG/ML) 5 ML SYRINGE
INTRAMUSCULAR | Status: DC | PRN
  Administered 2016-02-12: 50 mg via INTRAVENOUS

## 2016-02-12 MED ORDER — PHENYLEPHRINE HCL 10 MG/ML IJ SOLN
INTRAVENOUS | Status: DC | PRN
  Administered 2016-02-12: 25 ug/min via INTRAVENOUS

## 2016-02-12 MED ORDER — SODIUM CHLORIDE 0.9 % IV SOLN: INTRAVENOUS | Status: DC

## 2016-02-12 MED ORDER — ONDANSETRON HCL 4 MG/2ML IJ SOLN
INTRAMUSCULAR | Status: DC | PRN
  Administered 2016-02-12: 4 mg via INTRAVENOUS

## 2016-02-12 MED ORDER — ALLOPURINOL 300 MG PO TABS
150.0000 mg | ORAL_TABLET | Freq: Every day | ORAL | Status: DC
  Administered 2016-02-13: 150 mg via ORAL
  Filled 2016-02-12: qty 1

## 2016-02-12 MED ORDER — IOPAMIDOL (ISOVUE-300) INJECTION 61%
INTRAVENOUS | Status: AC
  Filled 2016-02-12: qty 50

## 2016-02-12 MED ORDER — PANTOPRAZOLE SODIUM 40 MG PO TBEC
40.0000 mg | DELAYED_RELEASE_TABLET | Freq: Two times a day (BID) | ORAL | Status: DC
  Administered 2016-02-12 – 2016-02-13 (×2): 40 mg via ORAL
  Filled 2016-02-12 (×2): qty 1

## 2016-02-12 MED ORDER — MIDAZOLAM HCL 2 MG/2ML IJ SOLN
INTRAMUSCULAR | Status: AC
  Filled 2016-02-12: qty 2

## 2016-02-12 MED ORDER — AMIODARONE HCL 100 MG PO TABS
100.0000 mg | ORAL_TABLET | Freq: Every day | ORAL | Status: DC
  Administered 2016-02-13: 100 mg via ORAL
  Filled 2016-02-12: qty 1

## 2016-02-12 MED ORDER — ROCURONIUM BROMIDE 10 MG/ML (PF) SYRINGE
PREFILLED_SYRINGE | INTRAVENOUS | Status: AC
  Filled 2016-02-12: qty 60

## 2016-02-12 MED ORDER — ETOMIDATE 2 MG/ML IV SOLN
INTRAVENOUS | Status: DC | PRN
  Administered 2016-02-12: 14 mg via INTRAVENOUS

## 2016-02-12 MED ORDER — BUPIVACAINE-EPINEPHRINE 0.25% -1:200000 IJ SOLN
INTRAMUSCULAR | Status: DC | PRN
  Administered 2016-02-12: 30 mL

## 2016-02-12 MED ORDER — MIDAZOLAM HCL 5 MG/5ML IJ SOLN
INTRAMUSCULAR | Status: DC | PRN
  Administered 2016-02-12 (×2): 1 mg via INTRAVENOUS

## 2016-02-12 MED ORDER — NEOSTIGMINE METHYLSULFATE 10 MG/10ML IV SOLN
INTRAVENOUS | Status: DC | PRN
  Administered 2016-02-12: 5 mg via INTRAVENOUS

## 2016-02-12 MED ORDER — LIDOCAINE 2% (20 MG/ML) 5 ML SYRINGE
INTRAMUSCULAR | Status: AC
  Filled 2016-02-12: qty 15

## 2016-02-12 MED ORDER — FENTANYL CITRATE (PF) 100 MCG/2ML IJ SOLN
INTRAMUSCULAR | Status: DC | PRN
  Administered 2016-02-12 (×2): 50 ug via INTRAVENOUS

## 2016-02-12 MED ORDER — SIMETHICONE 80 MG PO CHEW: 80.0000 mg | CHEWABLE_TABLET | Freq: Four times a day (QID) | ORAL | Status: DC | PRN

## 2016-02-12 MED ORDER — CHLORHEXIDINE GLUCONATE CLOTH 2 % EX PADS: 6.0000 | MEDICATED_PAD | Freq: Once | CUTANEOUS | Status: DC

## 2016-02-12 MED ORDER — SODIUM CHLORIDE 0.9 % IR SOLN
Status: DC | PRN
  Administered 2016-02-12: 2000 mL

## 2016-02-12 MED ORDER — SODIUM CHLORIDE 0.9 % IV SOLN
INTRAVENOUS | Status: DC | PRN
  Administered 2016-02-12: 17:00:00

## 2016-02-12 MED ORDER — OXYCODONE-ACETAMINOPHEN 5-325 MG PO TABS
1.0000 | ORAL_TABLET | ORAL | Status: DC | PRN
Start: 2016-02-12 — End: 2016-02-13
  Administered 2016-02-12: 2 via ORAL
  Filled 2016-02-12: qty 2

## 2016-02-12 MED ORDER — ETOMIDATE 2 MG/ML IV SOLN
INTRAVENOUS | Status: AC
  Filled 2016-02-12: qty 10

## 2016-02-12 MED ORDER — KETOROLAC TROMETHAMINE 30 MG/ML IJ SOLN
INTRAMUSCULAR | Status: AC
  Filled 2016-02-12: qty 1

## 2016-02-12 MED ORDER — CINACALCET HCL 30 MG PO TABS: 90.0000 mg | ORAL_TABLET | Freq: Every day | ORAL | Status: DC

## 2016-02-12 MED ORDER — 0.9 % SODIUM CHLORIDE (POUR BTL) OPTIME
TOPICAL | Status: DC | PRN
  Administered 2016-02-12: 1000 mL

## 2016-02-12 MED ORDER — ROCURONIUM BROMIDE 10 MG/ML (PF) SYRINGE
PREFILLED_SYRINGE | INTRAVENOUS | Status: DC | PRN
  Administered 2016-02-12: 10 mg via INTRAVENOUS
  Administered 2016-02-12: 5 mg via INTRAVENOUS
  Administered 2016-02-12: 10 mg via INTRAVENOUS
  Administered 2016-02-12: 50 mg via INTRAVENOUS

## 2016-02-12 MED ORDER — ONDANSETRON 4 MG PO TBDP
4.0000 mg | ORAL_TABLET | Freq: Four times a day (QID) | ORAL | Status: DC | PRN
  Administered 2016-02-12: 4 mg via ORAL
  Filled 2016-02-12: qty 1

## 2016-02-12 MED ORDER — CARVEDILOL 12.5 MG PO TABS: 12.5000 mg | ORAL_TABLET | ORAL | Status: DC

## 2016-02-12 MED ORDER — DEXTROSE 50 % IV SOLN
25.0000 mL | Freq: Once | INTRAVENOUS | Status: AC
  Administered 2016-02-12: 25 mL via INTRAVENOUS
  Filled 2016-02-12: qty 50

## 2016-02-12 MED ORDER — DEXTROSE 50 % IV SOLN
INTRAVENOUS | Status: AC
  Administered 2016-02-12: 25 mL via INTRAVENOUS
  Filled 2016-02-12: qty 50

## 2016-02-12 MED ORDER — PHENYLEPHRINE 40 MCG/ML (10ML) SYRINGE FOR IV PUSH (FOR BLOOD PRESSURE SUPPORT)
PREFILLED_SYRINGE | INTRAVENOUS | Status: AC
  Filled 2016-02-12: qty 30

## 2016-02-12 MED ORDER — BUPIVACAINE-EPINEPHRINE (PF) 0.25% -1:200000 IJ SOLN
INTRAMUSCULAR | Status: AC
  Filled 2016-02-12: qty 30

## 2016-02-12 MED ORDER — FENTANYL CITRATE (PF) 100 MCG/2ML IJ SOLN
INTRAMUSCULAR | Status: AC
  Filled 2016-02-12: qty 4

## 2016-02-12 MED ORDER — ONDANSETRON HCL 4 MG/2ML IJ SOLN
INTRAMUSCULAR | Status: AC
  Filled 2016-02-12: qty 8

## 2016-02-12 MED ORDER — SODIUM CHLORIDE 0.9 % IJ SOLN
INTRAMUSCULAR | Status: AC
  Filled 2016-02-12: qty 10

## 2016-02-12 MED ORDER — NITROGLYCERIN 0.4 MG SL SUBL
0.4000 mg | SUBLINGUAL_TABLET | SUBLINGUAL | Status: DC | PRN
  Administered 2016-02-12 (×2): 0.4 mg via SUBLINGUAL
  Filled 2016-02-12 (×2): qty 1

## 2016-02-12 MED ORDER — AMLODIPINE BESYLATE 5 MG PO TABS: 5.0000 mg | ORAL_TABLET | Freq: Every day | ORAL | Status: DC

## 2016-02-12 MED ORDER — ONDANSETRON HCL 4 MG/2ML IJ SOLN
4.0000 mg | Freq: Four times a day (QID) | INTRAMUSCULAR | Status: DC | PRN
  Administered 2016-02-13: 4 mg via INTRAVENOUS
  Filled 2016-02-12 (×2): qty 2

## 2016-02-12 MED ORDER — ALBUTEROL SULFATE HFA 108 (90 BASE) MCG/ACT IN AERS
INHALATION_SPRAY | RESPIRATORY_TRACT | Status: DC | PRN
  Administered 2016-02-12: 4 via RESPIRATORY_TRACT

## 2016-02-12 MED ORDER — ISOSORBIDE DINITRATE 10 MG PO TABS
20.0000 mg | ORAL_TABLET | Freq: Every day | ORAL | Status: DC
Start: 2016-02-13 — End: 2016-02-13
  Administered 2016-02-13: 20 mg via ORAL
  Filled 2016-02-12: qty 2

## 2016-02-12 MED ORDER — GLYCOPYRROLATE 0.2 MG/ML IJ SOLN
INTRAMUSCULAR | Status: DC | PRN
  Administered 2016-02-12: 0.6 mg via INTRAVENOUS

## 2016-02-12 MED ORDER — SEVELAMER CARBONATE 800 MG PO TABS
2400.0000 mg | ORAL_TABLET | Freq: Three times a day (TID) | ORAL | Status: DC
  Administered 2016-02-13: 2400 mg via ORAL
  Filled 2016-02-12: qty 3

## 2016-02-12 MED ORDER — SODIUM CHLORIDE 0.9 % IV SOLN
INTRAVENOUS | Status: DC
  Administered 2016-02-12 (×3): via INTRAVENOUS

## 2016-02-12 MED ORDER — FENTANYL CITRATE (PF) 100 MCG/2ML IJ SOLN
INTRAMUSCULAR | Status: AC
  Filled 2016-02-12: qty 2

## 2016-02-12 MED ORDER — HEMOSTATIC AGENTS (NO CHARGE) OPTIME
TOPICAL | Status: DC | PRN
  Administered 2016-02-12: 1 via TOPICAL

## 2016-02-12 MED ORDER — FENTANYL CITRATE (PF) 100 MCG/2ML IJ SOLN
25.0000 ug | INTRAMUSCULAR | Status: DC | PRN
  Administered 2016-02-12 (×2): 50 ug via INTRAVENOUS

## 2016-02-12 MED ORDER — CHLORHEXIDINE GLUCONATE CLOTH 2 % EX PADS
6.0000 | MEDICATED_PAD | Freq: Once | CUTANEOUS | Status: DC
Start: 2016-02-12 — End: 2016-02-12

## 2016-02-12 SURGICAL SUPPLY — 52 items
APL SKNCLS STERI-STRIP NONHPOA (GAUZE/BANDAGES/DRESSINGS) ×1
APPLIER CLIP 5 13 M/L LIGAMAX5 (MISCELLANEOUS) ×2
APR CLP MED LRG 5 ANG JAW (MISCELLANEOUS) ×1
BAG SPEC RTRVL LRG 6X4 10 (ENDOMECHANICALS) ×1
BENZOIN TINCTURE PRP APPL 2/3 (GAUZE/BANDAGES/DRESSINGS) ×2 IMPLANT
CANISTER SUCTION 2500CC (MISCELLANEOUS) ×2 IMPLANT
CATH REDDICK CHOLANGI 4FR 50CM (CATHETERS) ×2 IMPLANT
CHLORAPREP W/TINT 26ML (MISCELLANEOUS) ×2 IMPLANT
CLIP APPLIE 5 13 M/L LIGAMAX5 (MISCELLANEOUS) ×1 IMPLANT
COVER MAYO STAND STRL (DRAPES) ×2 IMPLANT
COVER SURGICAL LIGHT HANDLE (MISCELLANEOUS) ×2 IMPLANT
DECANTER SPIKE VIAL GLASS SM (MISCELLANEOUS) ×2 IMPLANT
DISSECTOR BLUNT TIP ENDO 5MM (MISCELLANEOUS) ×1 IMPLANT
DRAPE C-ARM 42X72 X-RAY (DRAPES) ×2 IMPLANT
DRSG TEGADERM 2-3/8X2-3/4 SM (GAUZE/BANDAGES/DRESSINGS) ×5 IMPLANT
ELECT REM PT RETURN 9FT ADLT (ELECTROSURGICAL) ×2
ELECTRODE REM PT RTRN 9FT ADLT (ELECTROSURGICAL) ×1 IMPLANT
ENDOLOOP SUT PDS II  0 18 (SUTURE) ×2
ENDOLOOP SUT PDS II 0 18 (SUTURE) IMPLANT
GAUZE SPONGE 2X2 8PLY STRL LF (GAUZE/BANDAGES/DRESSINGS) ×1 IMPLANT
GLOVE BIO SURGEON STRL SZ 6.5 (GLOVE) ×1 IMPLANT
GLOVE BIOGEL PI IND STRL 6.5 (GLOVE) IMPLANT
GLOVE BIOGEL PI IND STRL 7.0 (GLOVE) IMPLANT
GLOVE BIOGEL PI IND STRL 8 (GLOVE) ×1 IMPLANT
GLOVE BIOGEL PI INDICATOR 6.5 (GLOVE) ×1
GLOVE BIOGEL PI INDICATOR 7.0 (GLOVE) ×1
GLOVE BIOGEL PI INDICATOR 8 (GLOVE) ×1
GLOVE ECLIPSE 8.0 STRL XLNG CF (GLOVE) ×2 IMPLANT
GOWN STRL REUS W/ TWL LRG LVL3 (GOWN DISPOSABLE) ×3 IMPLANT
GOWN STRL REUS W/TWL LRG LVL3 (GOWN DISPOSABLE) ×8
HEMOSTAT SNOW SURGICEL 2X4 (HEMOSTASIS) ×1 IMPLANT
IV CATH 14GX2 1/4 (CATHETERS) ×2 IMPLANT
KIT BASIN OR (CUSTOM PROCEDURE TRAY) ×2 IMPLANT
KIT ROOM TURNOVER OR (KITS) ×2 IMPLANT
NS IRRIG 1000ML POUR BTL (IV SOLUTION) ×2 IMPLANT
PAD ARMBOARD 7.5X6 YLW CONV (MISCELLANEOUS) ×2 IMPLANT
POUCH SPECIMEN RETRIEVAL 10MM (ENDOMECHANICALS) ×2 IMPLANT
SCISSORS LAP 5X35 DISP (ENDOMECHANICALS) ×2 IMPLANT
SET IRRIG TUBING LAPAROSCOPIC (IRRIGATION / IRRIGATOR) ×2 IMPLANT
SHEARS HARMONIC ACE PLUS 36CM (ENDOMECHANICALS) ×1 IMPLANT
SLEEVE ENDOPATH XCEL 5M (ENDOMECHANICALS) ×6 IMPLANT
SPECIMEN JAR SMALL (MISCELLANEOUS) ×2 IMPLANT
SPONGE GAUZE 2X2 STER 10/PKG (GAUZE/BANDAGES/DRESSINGS) ×1
STRIP CLOSURE SKIN 1/2X4 (GAUZE/BANDAGES/DRESSINGS) ×2 IMPLANT
SUT MON AB 4-0 PC3 18 (SUTURE) ×3 IMPLANT
TOWEL OR 17X24 6PK STRL BLUE (TOWEL DISPOSABLE) ×2 IMPLANT
TOWEL OR 17X26 10 PK STRL BLUE (TOWEL DISPOSABLE) ×2 IMPLANT
TRAY LAPAROSCOPIC MC (CUSTOM PROCEDURE TRAY) ×2 IMPLANT
TROCAR XCEL BLUNT TIP 100MML (ENDOMECHANICALS) ×2 IMPLANT
TROCAR XCEL NON-BLD 11X100MML (ENDOMECHANICALS) ×1 IMPLANT
TROCAR XCEL NON-BLD 5MMX100MML (ENDOMECHANICALS) ×2 IMPLANT
TUBING INSUFFLATION (TUBING) ×2 IMPLANT

## 2016-02-12 NOTE — Anesthesia Procedure Notes (Signed)
Procedure Name: Intubation Date/Time: 02/12/2016 3:43 PM Performed by: Everlean Cherry A Pre-anesthesia Checklist: Patient identified, Emergency Drugs available, Suction available, Patient being monitored and Timeout performed Patient Re-evaluated:Patient Re-evaluated prior to inductionOxygen Delivery Method: Circle system utilized Preoxygenation: Pre-oxygenation with 100% oxygen Intubation Type: IV induction Ventilation: Mask ventilation without difficulty Laryngoscope Size: Miller and 2 Grade View: Grade I Tube type: Oral Tube size: 7.5 mm Number of attempts: 1 Placement Confirmation: ETT inserted through vocal cords under direct vision,  positive ETCO2 and breath sounds checked- equal and bilateral Secured at: 22 cm Tube secured with: Tape Dental Injury: Teeth and Oropharynx as per pre-operative assessment

## 2016-02-12 NOTE — Anesthesia Postprocedure Evaluation (Signed)
Anesthesia Post Note  Patient: [name]  Procedure(s) Performed: Procedure(s) (LRB): LAPAROSCOPIC CHOLECYSTECTOMY WITH INTRAOPERATIVE CHOLANGIOGRAM (N/A) LAPAROSCOPIC LYSIS OF ADHESIONS (N/A)  Patient location during evaluation: PACU Anesthesia Type: General Level of consciousness: awake and alert Pain management: pain level controlled Vital Signs Assessment: post-procedure vital signs reviewed and stable Respiratory status: spontaneous breathing, nonlabored ventilation and respiratory function stable Cardiovascular status: blood pressure returned to baseline and stable Postop Assessment: no signs of nausea or vomiting Anesthetic complications: no    Last Vitals:  Vitals:   02/12/16 1845 02/12/16 1900  BP:  121/83  Pulse:  71  Resp: (!) 25 (!) 21  Temp:      Last Pain:  Vitals:   02/12/16 1840  TempSrc:   PainSc: Asleep                 Mykal Kirchman A

## 2016-02-12 NOTE — Anesthesia Preprocedure Evaluation (Addendum)
Anesthesia Evaluation  Patient identified by MRN, date of birth, ID band Patient awake    Reviewed: Allergy & Precautions, H&P , NPO status   Airway Mallampati: II  TM Distance: >3 FB Neck ROM: full    Dental  (+) Poor Dentition, Missing, Loose, Dental Advidsory Given   Pulmonary shortness of breath and with exertion,    breath sounds clear to auscultation       Cardiovascular hypertension, + CAD, + Past MI and +CHF   Rhythm:regular     Neuro/Psych CVA, No Residual Symptoms    GI/Hepatic GERD  Medicated and Controlled,  Endo/Other  diabetes  Renal/GU Renal disease     Musculoskeletal   Abdominal   Peds  Hematology   Anesthesia Other Findings Study Conclusions  - Left ventricle: The cavity size was severely dilated. There was   moderate concentric hypertrophy. Systolic function was severely   reduced. The estimated ejection fraction was in the range of 20%   to 25%. Diffuse hypokinesis. - Aortic valve: There was trivial regurgitation. - Mitral valve: There was moderate regurgitation. - Left atrium: The atrium was severely dilated. - Right ventricle: The cavity size was mildly dilated. Wall   thickness was normal. Systolic function was mildly to moderately   reduced. - Right atrium: The atrium was moderately dilated. - Tricuspid valve: There was moderate regurgitation. - Pulmonary arteries: Systolic pressure was mildly to moderately   increased. PA peak pressure: 52 mm Hg (S).  Reproductive/Obstetrics                            Anesthesia Physical Anesthesia Plan  ASA: IV  Anesthesia Plan: General   Post-op Pain Management:    Induction: Intravenous  Airway Management Planned: Oral ETT  Additional Equipment:   Intra-op Plan:   Post-operative Plan: Possible Post-op intubation/ventilation  Informed Consent:   Dental Advisory Given and Dental advisory given  Plan  Discussed with: CRNA and Anesthesiologist  Anesthesia Plan Comments:        Anesthesia Quick Evaluation

## 2016-02-12 NOTE — Op Note (Signed)
OPERATIVE NOTE-LAPAROSCOPIC CHOLECYSTECTOMY  Preoperative diagnosis:  Biliary dyskinesia  Postoperative diagnosis:  Same with intraabdominal adhesions  Procedure:  Laparoscopic lysis of adhesions (60 minutes).  Laparoscopic cholecystectomy with cholangiogram.  Surgeon: Jackolyn Confer, M.D.  Asst.:  Sharyn Dross, RNFA  Anesthesia: General  Indication:   A 55 year old male with biliary colic type right upper quadrant pain following meals. Evaluation was consistent with biliary dyskinesia. He now presents for cholecystectomy. Of note is that he has had a previous right nephrectomy in the past as well as a laparoscopic umbilical hernia repair with mesh.  Technique: He was brought to the operating room, placed supine on the operating table, and a general anesthetic was administered. The hair on the abdominal wall was clipped as was necessary. The abdominal wall was then sterilely prepped and draped.  A timeout was performed.    Local anesthetic (Marcaine) was infiltrated in the left subcostal region. A small  incision was made through the skin.  Using a 5 mm Optiview trocar laparoscope, access was gained into the peritoneal cavity. A pneumoperitoneum was created. The laparoscope was introduced into the trocar and no underlying bleeding or organ injury was noted. He was then placed in the reverse Trendelenburg position with the right side tilted slightly up.   Multiple intra-abdominal adhesions were noted between the omentum and anterior abdominal wall in the midline area as well as the right upper quadrant. There were some adhesions to mesh.  A 5 mm trocar was placed in the right lower quadrant area. Using some blunt and sharp dissection began lysing some of the omental adhesions and freeing and numb up from the abdominal wall. I was then able to place a 5 mm trocar to the right of the umbilicus. I subsequently further lysed adhesions using blunt dissection as well as a Harmonic scalpel freeing the  omentum from the abdominal wall in the right upper quadrant area. There were adhesions between the liver and the abdominal wall that were freed up. I continued to perform adhesio lysis. There were adhesions between the transverse colon and abdominal wall which were freed up sharply. There were adhesions between the edge of the liver and the omentum which I divided and then identified the gallbladder. There were adhesions between the omentum and gallbladder that I mobilized. Lysis of adhesions took a total of 60 minutes. I carefully inspected this area and there is no evidence of organ injury or bleeding.  A 5 mm trocar was then placed in the epigastrium to the right of the midline. A 5 mm trocar was placed in the right upper quadrant area through previous scar. The gallbladder was visualized and the fundus was grasped and retracted toward the right shoulder.  The infundibulum was mobilized with dissection close to the gallbladder and retracted laterally. The cystic duct was identified and a window was created around it. The cystic artery was also identified and a window was created around it. The critical view was achieved. A clip was placed at the neck of the gallbladder. A small incision was made in the neck of the gallbladder. A cholangiocatheter was introduced through the anterior abdominal wall and placed in the cystic duct. A intraoperative cholangiogram was then performed.  Under real-time fluoroscopy, dilute contrast was injected into the cystic duct which was long.  The common hepatic duct, the right and left hepatic ducts, and the common duct were all visualized. Contrast drained into the duodenum without obvious evidence of any obstructing ductal lesion. The final report is pending  the Radiologist's interpretation.  The cholangiocatheter was removed, the cystic duct was clipped 2 times on the biliary side, and then the cystic duct was divided sharply. 2 PDS Endoloops were then placed on the cystic  duct as well.  No bile leak was noted from the cystic duct stump. The 5 mm epigastric trocar was replaced with an 11 mm trochars to the gallbladder could be removed through this incision.  The cystic artery was then clipped and divided. Following this the gallbladder was dissected free from the liver using electrocautery. The gallbladder was then placed in a retrieval bag and removed from the abdominal cavity through the epigastric incision.  The gallbladder fossa was inspected, irrigated, and bleeding was controlled with electrocautery. Inspection showed that hemostasis was adequate and there was no evidence of bile leak.  The irrigation fluid was evacuated as much as possible.  A piece of Surgicel was placed in the gallbladder fossa.  The remaining trocars were removed and the pneumoperitoneum was released. The skin incisions were closed with 4-0 Monocryl subcuticular stitches. Steri-Strips and sterile dressings were applied.  The procedure was well-tolerated without any apparent complications. He was taken to the recovery room in satisfactory condition.

## 2016-02-12 NOTE — H&P (Signed)
History of Present Illness   The patient is a 55 year old male.   Note:He was referred by Dr. Collene Mares for consultation because of RUQ abdominal pain and finding consistent with biliary dyskinesia. He began having these pains about 3 months ago and the frequency has escalated. It seemed be related to eating a fatty meal. Ultrasound demonstrated a normal gallbladder with no stones. Nuclear medicine scan demonstrated findings consistent with biliary dyskinesia-gallbladder ejection fraction 20%. Upper endoscopy not demonstrate knowledge reason for his symptoms. He sent here to discuss cholecystectomy. He is on chronic hemodialysis. He undergoes dialysis every Monday, Wednesday, and Friday. He is also on chronic Coumadin. He saw his cardiologist last week, Dr. Doylene Canard, who felt he couldn't have the surgery. He also said he could stop his Coumadin preop. PMH and problem list is below.  As for abdominal operations, he had a laparoscopic umbilical hernia repair by Dr. Lilyan Punt. He had a right nephrectomy in the past.   Systolic CHF, chronic ([place]) Create Overview Unprioritized 04/21/2015 Rise Patience, MD RUQ pain Create Overview Unprioritized 01/13/2015 Charlynne Cousins, MD Right sided weakness Create Overview Unprioritized 12/18/2014 Melton Alar, PA-C Pulmonary edema Create Overview Unprioritized 01/13/2015 Forde Dandy, MD Hypertension, benign Create Overview Unprioritized 04/21/2015 Rise Patience, MD History of nephrectomy Edit Overview Unprioritized 12/18/2014 Melton Alar, PA-C Overview History of right nephrectomy in 2002 for renal cell carcinoma  Hepatic cyst Create Overview Unprioritized 12/18/2014 Melton Alar, PA-C Gout, chronic Create Overview Unprioritized 04/21/2015 Rise Patience, MD End-stage renal disease on hemodialysis [facility]) Create Overview Unprioritized 04/21/2015 Rise Patience, MD End  stage renal disease [facility]) Create Overview Unprioritized 01/13/2015 Charlynne Cousins, MD DM II (diabetes mellitus, type II), controlled [facility]) Create Overview Unprioritized 04/21/2015 Rise Patience, MD Diverticulitis of intestine without perforation or abscess without bleeding Create Overview Unprioritized 04/21/2015 Rise Patience, MD Diverticulitis large intestine w/o perforation or abscess w/o bleeding Create Overview Unprioritized 12/18/2014 Melton Alar, PA-C Diverticulitis Create Overview Unprioritized 04/21/2015 Rise Patience, MD Atrial flutter with rapid ventricular response [facility]) Create Overview Unprioritized 06/27/2015 Dixie Dials, MD Aftercare following surgery of the circulatory system, NEC Create Overview Unprioritized 06/22/2013 Eldridge-Lewis, Melba L, RMA Acute respiratory failure with hypoxia [facility]) Create Overview Unprioritized 01/13/2015 Charlynne Cousins, MD Acute diverticulitis Create Overview Unprioritized 04/21/2015 Rise Patience, MD  Elta [place] as Reviewed  Other Problems  Bladder Problems Chest pain Chronic Renal Failure Syndrome Congestive Heart Failure Diabetes Mellitus Gastroesophageal Reflux Disease High blood pressure Kidney Stone  Past Surgical History (April Staton, [place]; 01/06/2016 3:14 PM) Dialysis Shunt / Fistula Nephrectomy Right.  Diagnostic Studies History  Colonoscopy never  Allergies  ACE Inhibitors  Prior to Admission medications   Medication Sig Start Date End Date Taking? Authorizing Provider  allopurinol (ZYLOPRIM) 300 MG tablet Take 150 mg by mouth daily. 12/07/15  Yes Historical Provider, MD  amiodarone (PACERONE) 200 MG tablet Take 0.5 tablets (100 mg total) by mouth daily. 07/05/15  Yes Dixie Dials, MD  amLODipine (NORVASC) 5 MG tablet Take 5 mg by mouth daily.    Yes Historical Provider, MD  carvedilol (COREG) 25 MG tablet Take 0.5 tablets (12.5 mg total) by mouth See  admin instructions. Take 1/2 tablet (12.5 mg) by mouth in the evening on dialysis (Monday, Wednesday, Friday and 1/2 tablet (12.5 mg) twice daily on Tuesday, Thursday, Saturday, Sunday (non-dialysis days) 07/05/15  Yes Dixie Dials, MD  Cholecalciferol (VITAMIN D PO) Take 1 tablet by mouth daily.  Yes Historical Provider, MD  cinacalcet (SENSIPAR) 90 MG tablet Take 90 mg by mouth daily.    Yes Historical Provider, MD  isosorbide dinitrate (ISORDIL) 20 MG tablet Take 20 mg by mouth daily.    Yes Historical Provider, MD  lidocaine-prilocaine (EMLA) cream Apply 1 application topically every Monday, Wednesday, and Friday. Apply to port access prior to dialysis 04/08/15  Yes Historical Provider, MD  oxyCODONE-acetaminophen (PERCOCET/ROXICET) 5-325 MG tablet Take 1 tablet by mouth every 6 (six) hours as needed for moderate pain. 01/27/16  Yes Kimberly A Trinh, PA-C  pantoprazole (PROTONIX) 40 MG tablet Take 1 tablet (40 mg total) by mouth 2 (two) times daily. 01/27/16  Yes Alvia Grove, PA-C  sevelamer carbonate (RENVELA) 800 MG tablet Take 3 tablets (2,400 mg total) by mouth 3 (three) times daily with meals. 07/05/15  Yes Dixie Dials, MD  warfarin (COUMADIN) 6 MG tablet Take 1.5 tablets (9 mg total) by mouth daily. 01/28/16  Yes Alvia Grove, PA-C  nitroGLYCERIN (NITROSTAT) 0.4 MG SL tablet Place 0.4 mg under the tongue every 5 (five) minutes as needed for chest pain.  05/20/15   Historical Provider, MD    Social History  Alcohol use Occasional alcohol use. Caffeine use Coffee, Tea. Illicit drug use Remotely quit drug use. Tobacco use Former smoker.  Family History  Hypertension Daughter, Father, Mother, Sister.    Physical Exam   The physical exam findings are as follows: Note:General: Obese in NAD. Pleasant and cooperative.  HEENT: [place]/AT  EYES: EOMI, no scleral icterus, pupils normal  CV: RRR, no murmur, no edema  CHEST: Breath sounds equal and clear. Respirations  nonlabored.  BREASTS: Symmetrical in size. No dominant masses, nipple discharge or suspicious skin lesions.  ABDOMEN: Soft, Mild RUQ tenderness, RUQ scar, periumbilical scars.  MUSCULOSKELETAL: Left arm AV fistula  NEUROLOGIC: Alert and oriented, answers questions appropriately  PSYCHIATRIC: Normal mood, affect , and behavior.    Assessment & Plan   BILIARY DYSKINESIA (K82.8) Impression: Frequency of symptoms is increasing. We talked about trying a strict low-fat diet versus proceeding with cholecystectomy. We talked about the success rate of cholecystectomy. He would like to proceed with it.  Plan: Laparoscopic possible open cholecystectomy with IOC. We will need to schedule on a Tuesday or Thursday because of his dialysis schedule. Stop Coumadin 5 days prior to procedure. I have explained the procedure, risks, and aftercare of cholecystectomy. Risks include but are not limited to bleeding, infection, wound problems, anesthesia, diarrhea, bile leak, injury to common bile duct/liver/intestine. He seems to understand and agrees to proceed.  Jackolyn Confer, MD

## 2016-02-12 NOTE — Discharge Instructions (Addendum)
LAPAROSCOPIC SURGERY: POST OP INSTRUCTIONS  1. DIET: Follow a liquid bland diet the first day after arrival home, such as soup, liquids, crackers, etc.  Two days after surgery may eat lowfat (no fried food or greasy food) diet.   2. Take your usually prescribed home medications unless otherwise directed. 3. PAIN CONTROL: a. Pain is best controlled by a usual combination of three different methods TOGETHER: i. Ice/Heat ii. Over the counter pain medication iii. Prescription pain medication b. Most patients will experience some swelling and bruising around the incisions.  Ice packs or heating pads (30-60 minutes up to 6 times a day) will help. Use ice for the first few days to help decrease swelling and bruising, then switch to heat to help relax tight/sore spots and speed recovery.  Some people prefer to use ice alone, heat alone, alternating between ice & heat.  Experiment to what works for you.  Swelling and bruising can take several weeks to resolve.   c. It is helpful to take an over-the-counter pain medication regularly for the first few weeks.  Choose one of the following that works best for you: i. Naproxen (Aleve, etc)  Two 220mg  tabs twice a day ii. Ibuprofen (Advil, etc) Three 200mg  tabs four times a day (every meal & bedtime) iii. Acetaminophen (Tylenol, etc) 500-650mg  four times a day (every meal & bedtime) d. A  prescription for pain medication (such as oxycodone, hydrocodone, etc) should be given to you upon discharge.  Take your pain medication as prescribed.  i. If you are having problems/concerns with the prescription medicine (does not control pain, nausea, vomiting, rash, itching, etc), please call us [phone] to see if we need to switch you to a different pain medicine that will work better for you and/or control your side effect better. ii. If you need a refill on your pain medication, please contact your pharmacy.  They will contact our office to request authorization.  Prescriptions will not be filled after 5 pm or on week-ends. 4. Avoid getting constipated.  Between the surgery and the pain medications, it is common to experience some constipation.  Increasing fluid intake and taking a fiber supplement (such as Metamucil, Citrucel, FiberCon, MiraLax, etc) 1-2 times a day regularly will usually help prevent this problem from occurring.  A mild laxative (prune juice, Milk of Magnesia, MiraLax, etc) should be taken according to package directions if there are no bowel movements after 48 hours.   5. Watch out for diarrhea.  If you have many loose bowel movements, simplify your diet to bland foods & liquids for a few days.  Stop any stool softeners and decrease your fiber supplement.  Switching to mild anti-diarrheal medications (Kayopectate, Pepto Bismol) can help.  If this worsens or does not improve, please call us. 6. Wash / shower every day.  You may shower over the dressings as they are waterproof.  Continue to shower over incision(s) after the dressing is off. 7. Remove your waterproof bandages 3 days after surgery.  You may leave the incision open to air.  You may replace a dressing/Band-Aid to cover the incision for comfort if you wish.  8. ACTIVITIES as tolerated:   a. You may resume regular (light) daily activities beginning the next day--such as daily self-care, walking, climbing stairs--gradually increasing light activities as tolerated.  No heavy lifting (over 10 pounds), straining, or intense activities for 2 weeks. b. DO NOT PUSH THROUGH PAIN.  Let pain be your guide: If it hurts to  do something, don't do it.  Pain is your body warning you to avoid that activity for another week until the pain goes down. c. You may drive when you are no longer taking prescription pain medication, you can comfortably wear a seatbelt, and you can safely maneuver your car and apply brakes. d. Dennis Bast may have sexual intercourse when it is comfortable.  9. FOLLOW UP in our  office a. Please call [facility] at [phone] to set up an appointment to see your surgeon in the office for a follow-up appointment approximately 2-3 weeks after your surgery. b. Make sure that you call for this appointment the day you arrive home to insure a convenient appointment time. 10. IF YOU HAVE DISABILITY OR FAMILY LEAVE FORMS, BRING THEM TO THE OFFICE FOR PROCESSING.  DO NOT GIVE THEM TO YOUR DOCTOR.  11.  Return to work/school:  Desk work/light activities in 5-7 days, full duty/activities in 2 weeks if pain-free.  12.  Restart Warfarin on 02/15/16.  Have INR checked 5 days after restarting Warfarin.   WHEN TO CALL us [phone]: 1. Poor pain control 2. Reactions / problems with new medications (rash/itching, nausea, etc)  3. Fever over 101.5 F (38.5 C) 4. Inability to urinate 5. Nausea and/or vomiting 6. Worsening swelling or bruising 7. Continued bleeding from incision. 8. Increased pain, redness, or drainage from the incision   The clinic staff is available to answer your questions during regular business hours (8:30am-5pm).  Please dont hesitate to call and ask to speak to one of our nurses for clinical concerns.   If you have a medical emergency, go to the nearest emergency room or call 911.  A surgeon from [facility] is always on call at the [facility], [address], [place], [place], [place]  [address] ? MAIN: [phone] ? TOLL FREE: [phone] ?  FAX [phone] [email or web address]

## 2016-02-12 NOTE — Interval H&P Note (Signed)
History and Physical Interval Note:  02/12/2016 3:16 PM  [name]  has presented today for surgery, with the diagnosis of BILIARY DYSKINESIA  The various methods of treatment have been discussed with the patient and family. After consideration of risks, benefits and other options for treatment, the patient has consented to  Procedure(s): LAPAROSCOPIC CHOLECYSTECTOMY WITH INTRAOPERATIVE CHOLANGIOGRAM (N/A) as a surgical intervention .  The patient's history has been reviewed, patient examined, no change in status, stable for surgery.  I have reviewed the patient's chart and labs.  Questions were answered to the patient's satisfaction.     Joeleen Wortley Lenna Sciara

## 2016-02-12 NOTE — OR Nursing (Signed)
Surgical volunteer called to update family on procedure status - taking awhile but going well

## 2016-02-12 NOTE — Transfer of Care (Signed)
Immediate Anesthesia Transfer of Care Note  Patient: Damario Gillie  Procedure(s) Performed: Procedure(s): LAPAROSCOPIC CHOLECYSTECTOMY WITH INTRAOPERATIVE CHOLANGIOGRAM (N/A) LAPAROSCOPIC LYSIS OF ADHESIONS (N/A)  Patient Location: PACU  Anesthesia Type:General  Level of Consciousness: awake, alert , oriented and patient cooperative  Airway & Oxygen Therapy: Patient Spontanous Breathing and Patient connected to nasal cannula oxygen  Post-op Assessment: Report given to RN and Post -op Vital signs reviewed and stable  Post vital signs: Reviewed and stable  Last Vitals:  Vitals:   02/12/16 1102  BP: 125/69  Pulse: 72  Resp: 18  Temp: 36.7 C    Last Pain:  Vitals:   02/12/16 1102  TempSrc: Oral  PainSc: 10-Worst pain ever         Complications: No apparent anesthesia complications

## 2016-02-13 ENCOUNTER — Encounter: Payer: Self-pay | Admitting: Vascular Surgery

## 2016-02-13 ENCOUNTER — Encounter: Payer: Medicare Other | Admitting: Vascular Surgery

## 2016-02-13 ENCOUNTER — Encounter (HOSPITAL_COMMUNITY): Payer: Self-pay | Admitting: General Surgery

## 2016-02-13 DIAGNOSIS — E1122 Type 2 diabetes mellitus with diabetic chronic kidney disease: Secondary | ICD-10-CM | POA: Diagnosis not present

## 2016-02-13 DIAGNOSIS — K811 Chronic cholecystitis: Secondary | ICD-10-CM | POA: Diagnosis not present

## 2016-02-13 DIAGNOSIS — K828 Other specified diseases of gallbladder: Secondary | ICD-10-CM | POA: Diagnosis not present

## 2016-02-13 DIAGNOSIS — I132 Hypertensive heart and chronic kidney disease with heart failure and with stage 5 chronic kidney disease, or end stage renal disease: Secondary | ICD-10-CM | POA: Diagnosis not present

## 2016-02-13 DIAGNOSIS — K660 Peritoneal adhesions (postprocedural) (postinfection): Secondary | ICD-10-CM | POA: Diagnosis not present

## 2016-02-13 DIAGNOSIS — N186 End stage renal disease: Secondary | ICD-10-CM | POA: Diagnosis not present

## 2016-02-13 LAB — CBC
HCT: 37.2 % — ABNORMAL LOW (ref 39.0–52.0)
Hemoglobin: 11.6 g/dL — ABNORMAL LOW (ref 13.0–17.0)
MCH: 31.8 pg (ref 26.0–34.0)
MCHC: 31.2 g/dL (ref 30.0–36.0)
MCV: 101.9 fL — ABNORMAL HIGH (ref 78.0–100.0)
Platelets: 171 10*3/uL (ref 150–400)
RBC: 3.65 MIL/uL — ABNORMAL LOW (ref 4.22–5.81)
RDW: 15 % (ref 11.5–15.5)
WBC: 4.8 10*3/uL (ref 4.0–10.5)

## 2016-02-13 LAB — GLUCOSE, CAPILLARY
Glucose-Capillary: 97 mg/dL (ref 65–99)
Glucose-Capillary: 111 mg/dL — ABNORMAL HIGH (ref 65–99)
Glucose-Capillary: 98 mg/dL (ref 65–99)

## 2016-02-13 LAB — RENAL FUNCTION PANEL
Albumin: 2.8 g/dL — ABNORMAL LOW (ref 3.5–5.0)
Anion gap: 8 (ref 5–15)
BUN: 28 mg/dL — ABNORMAL HIGH (ref 6–20)
CO2: 28 mmol/L (ref 22–32)
Calcium: 9.2 mg/dL (ref 8.9–10.3)
Chloride: 97 mmol/L — ABNORMAL LOW (ref 101–111)
Creatinine, Ser: 12.23 mg/dL — ABNORMAL HIGH (ref 0.61–1.24)
GFR calc Af Amer: 5 mL/min — ABNORMAL LOW (ref >60)
GFR calc non Af Amer: 4 mL/min — ABNORMAL LOW (ref >60)
Glucose, Bld: 102 mg/dL — ABNORMAL HIGH (ref 65–99)
Phosphorus: 4.6 mg/dL (ref 2.5–4.6)
Potassium: 5.7 mmol/L — ABNORMAL HIGH (ref 3.5–5.1)
Sodium: 133 mmol/L — ABNORMAL LOW (ref 135–145)

## 2016-02-13 MED ORDER — OXYCODONE-ACETAMINOPHEN 5-325 MG PO TABS: 1.0000 | ORAL_TABLET | ORAL | 0 refills | Status: DC | PRN

## 2016-02-13 MED ORDER — MORPHINE SULFATE (PF) 4 MG/ML IV SOLN
INTRAVENOUS | Status: AC
  Administered 2016-02-13: 4 mg
  Filled 2016-02-13: qty 1

## 2016-02-13 MED ORDER — LIDOCAINE HCL (PF) 1 % IJ SOLN: 5.0000 mL | INTRAMUSCULAR | Status: DC | PRN

## 2016-02-13 MED ORDER — ALTEPLASE 2 MG IJ SOLR: 2.0000 mg | Freq: Once | INTRAMUSCULAR | Status: DC | PRN

## 2016-02-13 MED ORDER — SODIUM CHLORIDE 0.9 % IV SOLN: 100.0000 mL | INTRAVENOUS | Status: DC | PRN

## 2016-02-13 MED ORDER — LIDOCAINE-PRILOCAINE 2.5-2.5 % EX CREA: 1.0000 "application " | TOPICAL_CREAM | CUTANEOUS | Status: DC | PRN

## 2016-02-13 MED ORDER — PENTAFLUOROPROP-TETRAFLUOROETH EX AERO: 1.0000 "application " | INHALATION_SPRAY | CUTANEOUS | Status: DC | PRN

## 2016-02-13 MED ORDER — HEPARIN SODIUM (PORCINE) 1000 UNIT/ML DIALYSIS: 1000.0000 [IU] | INTRAMUSCULAR | Status: DC | PRN

## 2016-02-13 NOTE — Consult Note (Signed)
[facility] Renal Consultation Note    Indication for Consultation:  Management of ESRD/hemodialysis; anemia, hypertension/volume and secondary hyperparathyroidism  HPI: [name] is a 55 y.o. male with ESRD secondary to HTN with solitaray kidney on HD since January 2012.  He was referred to Dr. Zella Richer by Dr. Collene Mares due to RUQ abdominal pain and findings consistent with biliary dyskinesia that increased over the past 3 months. Nuc med scan confirmed diagnosis.  He stopped his coumadin pre surgery (on for aflutter followed by Dr. Doylene Canard.     He also had recent surgery for removal of ulceration on the middles aspect of the skin over the AVF that was excised 01/27/2016 by Dr. Donzetta Matters. He has only taken liquids so far and has not been out of bed and did not want to stand.  Belly is very sore. He is passing gas and has no N and V or SOB.Marland Kitchen  AVF is sore from infiltration last week.  He was seen by Dr. Zella Richer this am and discharged is planned if he can tolerate lunch.  Past Medical History:  Diagnosis Date  . Atrial flutter ([place])   . 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 Cardiomyopathy   . CHF (congestive heart failure) ([place])   . Coronary artery disease    normal coronaries by 10/10/08 cath, Cardiac Cath 08-04-12 epic.Dr. Doylene Canard follows  . Diabetes mellitus    pt. states he's borderline diabetic., no longer taking med,- off med. since 2013  . Dialysis patient [facility])    Mon-Wed-Fri([place])- Left AV fistula  . Diverticulitis November 2016   reoccurred in December 2016  . ESRD (end stage renal disease) ([place])   . GERD (gastroesophageal reflux disease)   . Gout   . History of nephrectomy 07/04/2012   History of right nephrectomy in 2002 for renal cell carcinoma   . History of unilateral nephrectomy   . Hypertension   . Low iron   . Myocardial infarction [facility]) ?2006  . Renal cell carcinoma    dialysis- MWF- Dr. Mercy Moore follows.  . Renal  insufficiency   . Shortness of breath 05/19/11   "at rest, lying down, w/exertion"  . Stroke [facility]) 02/2011   05/19/11 denies residual  . Umbilical hernia 16/10/96   unrepaired   Past Surgical History:  Procedure Laterality Date  . AV FISTULA PLACEMENT  03/29/2011   Procedure: ARTERIOVENOUS (AV) FISTULA CREATION;  Surgeon: Hinda Lenis, MD;  Location: [place];  Service: Vascular;  Laterality: Left;  LEFT RADIOCEPHALIC , Arteriovenous [id number])  . CARDIAC CATHETERIZATION  05/21/11  . dialysis cath placed    . ESOPHAGOGASTRODUODENOSCOPY (EGD) WITH PROPOFOL N/A 12/02/2015   Procedure: ESOPHAGOGASTRODUODENOSCOPY (EGD) WITH PROPOFOL;  Surgeon: Juanita Craver, MD;  Location: [facility] ENDOSCOPY;  Service: Endoscopy;  Laterality: N/A;  . FINGER SURGERY     L pinkie finger- ORIF- /w remaining hardware - 1990's    . HERNIA REPAIR N/A 7/82/9562   Umbilical hernia repair  . INSERTION OF DIALYSIS CATHETER N/A 01/27/2016   Procedure: INSERTION OF DIALYSIS CATHETER;  Surgeon: Waynetta Sandy, MD;  Location: [place];  Service: Vascular;  Laterality: N/A;  . INSERTION OF MESH N/A 07/04/2012   Procedure: INSERTION OF MESH;  Surgeon: Madilyn Hook, DO;  Location: [place];  Service: General;  Laterality: N/A;  . LEFT AND RIGHT HEART CATHETERIZATION WITH CORONARY ANGIOGRAM N/A 08/04/2012   Procedure: LEFT AND RIGHT HEART CATHETERIZATION WITH CORONARY ANGIOGRAM;  Surgeon:  Birdie Riddle, MD;  Location: [place] CATH LAB;  Service: Cardiovascular;  Laterality: N/A;  . NEPHRECTOMY  2000   right  . REVISON OF ARTERIOVENOUS FISTULA Left 06/05/2013   Procedure: REVISON OF LEFT RADIOCEPHALIC  ARTERIOVENOUS FISTULA;  Surgeon: Conrad Solana, MD;  Location: [place];  Service: Vascular;  Laterality: Left;  . REVISON OF ARTERIOVENOUS FISTULA Left 01/27/2016   Procedure: REVISION OF LEFT UPPER EXTREMITY ARTERIOVENOUS FISTULA;  Surgeon: Waynetta Sandy, MD;  Location: [place];  Service: Vascular;  Laterality: Left;  . RIGHT  HEART CATHETERIZATION N/A 05/21/2011   Procedure: RIGHT HEART CATH;  Surgeon: Birdie Riddle, MD;  Location: [facility] CATH LAB;  Service: Cardiovascular;  Laterality: N/A;  . smashed  1990's   "left pinky; have a plate in there"  . UMBILICAL HERNIA REPAIR N/A 07/04/2012   Procedure: LAPAROSCOPIC UMBILICAL HERNIA;  Surgeon: Madilyn Hook, DO;  Location: [place];  Service: General;  Laterality: N/A;  . US ECHOCARDIOGRAPHY  05/20/11   Family History  Problem Relation Age of Onset  . Hypertension Mother   . Kidney disease Mother    Social History:  reports that he has never smoked. He has never used smokeless tobacco. He reports that he does not drink alcohol or use drugs. Allergies  Allergen Reactions  . Ace Inhibitors Itching and Cough   Prior to Admission medications   Medication Sig Start Date End Date Taking? Authorizing Provider  allopurinol (ZYLOPRIM) 300 MG tablet Take 150 mg by mouth daily. 12/07/15  Yes Historical Provider, MD  amiodarone (PACERONE) 200 MG tablet Take 0.5 tablets (100 mg total) by mouth daily. 07/05/15  Yes Dixie Dials, MD  amLODipine (NORVASC) 5 MG tablet Take 5 mg by mouth daily.    Yes Historical Provider, MD  carvedilol (COREG) 25 MG tablet Take 0.5 tablets (12.5 mg total) by mouth See admin instructions. Take 1/2 tablet (12.5 mg) by mouth in the evening on dialysis (Monday, Wednesday, Friday and 1/2 tablet (12.5 mg) twice daily on Tuesday, Thursday, Saturday, Sunday (non-dialysis days) 07/05/15  Yes Dixie Dials, MD  Cholecalciferol (VITAMIN D PO) Take 1 tablet by mouth daily.   Yes Historical Provider, MD  cinacalcet (SENSIPAR) 90 MG tablet Take 90 mg by mouth daily.    Yes Historical Provider, MD  isosorbide dinitrate (ISORDIL) 20 MG tablet Take 20 mg by mouth daily.    Yes Historical Provider, MD  lidocaine-prilocaine (EMLA) cream Apply 1 application topically every Monday, Wednesday, and Friday. Apply to port access prior to dialysis 04/08/15  Yes Historical Provider,  MD  oxyCODONE-acetaminophen (PERCOCET/ROXICET) 5-325 MG tablet Take 1 tablet by mouth every 6 (six) hours as needed for moderate pain. 01/27/16  Yes Kimberly A Trinh, PA-C  pantoprazole (PROTONIX) 40 MG tablet Take 1 tablet (40 mg total) by mouth 2 (two) times daily. 01/27/16  Yes Alvia Grove, PA-C  sevelamer carbonate (RENVELA) 800 MG tablet Take 3 tablets (2,400 mg total) by mouth 3 (three) times daily with meals. 07/05/15  Yes Dixie Dials, MD  warfarin (COUMADIN) 6 MG tablet Take 1.5 tablets (9 mg total) by mouth daily. 01/28/16  Yes Alvia Grove, PA-C  nitroGLYCERIN (NITROSTAT) 0.4 MG SL tablet Place 0.4 mg under the tongue every 5 (five) minutes as needed for chest pain.  05/20/15   Historical Provider, MD  oxyCODONE-acetaminophen (PERCOCET/ROXICET) 5-325 MG tablet Take 1-2 tablets by mouth every 4 (four) hours as needed for moderate pain. 02/13/16   Jackolyn Confer, MD   Current Facility-Administered  Medications  Medication Dose Route Frequency Provider Last Rate Last Dose  . 0.9 %  sodium chloride infusion   Intravenous Continuous Jackolyn Confer, MD      . 0.9 %  sodium chloride infusion  100 mL Intravenous PRN Corliss Parish, MD      . 0.9 %  sodium chloride infusion  100 mL Intravenous PRN Corliss Parish, MD      . allopurinol (ZYLOPRIM) tablet 150 mg  150 mg Oral Daily Jackolyn Confer, MD      . alteplase (CATHFLO ACTIVASE) injection 2 mg  2 mg Intracatheter Once PRN Corliss Parish, MD      . amiodarone (PACERONE) tablet 100 mg  100 mg Oral Daily Jackolyn Confer, MD      . amLODipine (NORVASC) tablet 5 mg  5 mg Oral Daily Jackolyn Confer, MD      . carvedilol (COREG) tablet 12.5 mg  12.5 mg Oral 2 times per day on Sun Tue Thu Sat Jackolyn Confer, MD   12.5 mg at 02/12/16 2205  . carvedilol (COREG) tablet 12.5 mg  12.5 mg Oral Once per day on Mon Wed Fri Jackolyn Confer, MD      . cinacalcet [facility]) tablet 90 mg  90 mg Oral Q supper Jackolyn Confer, MD      . heparin  injection 1,000 Units  1,000 Units Dialysis PRN Corliss Parish, MD      . isosorbide dinitrate (ISORDIL) tablet 20 mg  20 mg Oral Daily Jackolyn Confer, MD      . lidocaine (PF) (XYLOCAINE) 1 % injection 5 mL  5 mL Intradermal PRN Corliss Parish, MD      . lidocaine-prilocaine (EMLA) cream 1 application  1 application Topical PRN Corliss Parish, MD      . morphine 2 MG/ML injection 2-6 mg  2-6 mg Intravenous Q2H PRN Jackolyn Confer, MD   4 mg at 02/13/16 0308  . nitroGLYCERIN (NITROSTAT) SL tablet 0.4 mg  0.4 mg Sublingual Q5 min PRN Jackolyn Confer, MD   0.4 mg at 02/12/16 2224  . ondansetron (ZOFRAN-ODT) disintegrating tablet 4 mg  4 mg Oral Q6H PRN Jackolyn Confer, MD   4 mg at 02/12/16 2119   Or  . ondansetron (ZOFRAN) injection 4 mg  4 mg Intravenous Q6H PRN Jackolyn Confer, MD      . oxyCODONE-acetaminophen (PERCOCET/ROXICET) 5-325 MG per tablet 1-2 tablet  1-2 tablet Oral Q4H PRN Jackolyn Confer, MD   2 tablet at 02/12/16 2120  . pantoprazole (PROTONIX) EC tablet 40 mg  40 mg Oral BID Jackolyn Confer, MD   40 mg at 02/12/16 2205  . pentafluoroprop-tetrafluoroeth (GEBAUERS) aerosol 1 application  1 application Topical PRN Corliss Parish, MD      . sevelamer carbonate (RENVELA) tablet 2,400 mg  2,400 mg Oral TID WC Jackolyn Confer, MD      . simethicone (MYLICON) chewable tablet 80 mg  80 mg Oral QID PRN Greer Pickerel, MD       Labs: Basic Metabolic Panel:  Recent Labs Lab 02/12/16 1116 02/13/16 0738  NA 137 133*  K 5.6* 5.7*  CL 100* 97*  CO2 28 28  GLUCOSE 86 102*  BUN 21* 28*  CREATININE 10.34* 12.23*  CALCIUM 9.7 9.2  PHOS  --  4.6   Liver Function Tests:  Recent Labs Lab 02/12/16 1116 02/13/16 0738  AST 17  --   ALT 13*  --   ALKPHOS 60  --   BILITOT 0.9  --   PROT  7.6  --   ALBUMIN 3.3* 2.8*  CBC:  Recent Labs Lab 02/12/16 1116 02/13/16 0738  WBC 4.3 4.8  NEUTROABS 2.8  --   HGB 13.0 11.6*  HCT 40.8 37.2*  MCV 101.0* 101.9*  PLT 181  171   Lab Results  Component Value Date   INR 1.00 02/12/2016   INR 1.03 01/27/2016   INR 2.05 (H) 07/05/2015    CBG:  Recent Labs Lab 02/12/16 1409 02/12/16 1813 02/12/16 2036 02/13/16 0200 02/13/16 0553  GLUCAP 96 118* 79 97 111*  Studies/Results: Dg Cholangiogram Operative  Result Date: 02/13/2016 CLINICAL DATA:  Biliary dyskinesia EXAM: INTRAOPERATIVE CHOLANGIOGRAM TECHNIQUE: Cholangiographic images from the C-arm fluoroscopic device were submitted for interpretation post-operatively. Please see the procedural report for the amount of contrast and the fluoroscopy time utilized. COMPARISON:  None. FINDINGS: Contrast fills the biliary tree and duodenum without filling defects in the common bile duct. IMPRESSION: Patent biliary tree. Electronically Signed   By: Marybelle Killings M.D.   On: 02/13/2016 08:28    ROS: As per HPI otherwise negative.  Physical Exam: Vitals:   02/13/16 0730 02/13/16 0800 02/13/16 0830 02/13/16 0900  BP: 97/64 (!) 81/53 (!) 74/52 (!) 74/52  Pulse: 67 76 79 77  Resp: 15 (!) 25 (!) 22 (!) 25  Temp:      TempSrc:      SpO2:      Weight:      Height:         General: WDWN NAD obese Head: NCAT sclera not icteric MMM Neck: Supple.  Lungs: CTA bilaterally without wheezes, rales, or rhonchi anteriorly Dim BS. Breathing is unlabored. Heart: RRR with S1 S2.  Abdomen: + BS tender to touch Lower extremities: no sig LE edema  Neuro: A & O  X 3.  Psych:  Responds to questions appropriately with a normal affect. Dialysis Access:left lower AVF - staff having flow issues today  Assessment/Plan: 1. S/P lap chole with IOC for biliary dyskinesia 9/21 Dr. Zella Richer - discussed low fat diet ideas for discharge - stressed he needed to get up and move about- he is quite sedentary at baseline 2. ESRD -  MWF - on schedule K 5.7 - watch AVF function -  3. Hypertension/volume  - BP low this am - usually 100s pre HD came down on HD - goal may have been too high plus on  pain meds;  wouldn't stand for weights pre HD - encourage to try post 4. Anemia  - hgb 11.6 not on ESA 5. Metabolic bone disease -  Ca/P ok  With current meds; no VDRA 6. Nutrition - alb 2.8 7. Disp - hopefully d/c this pm 8. Aflutter - pacerone/coreg - coumadin still on hold  Myriam Jacobson, PA-C [place] [phone] 02/13/2016, 9:32 AM   Pt seen, examined and agree w A/P as above.  Kelly Splinter MD [employer] pager [phone]    cell [phone] 02/13/2016, 10:41 AM

## 2016-02-13 NOTE — Care Management Note (Signed)
Case Management Note  Patient Details  Name: [name] MRN: [id number] Date of Birth: [birth year]  Subjective/Objective:                 Patient from home with spouse. Admitted with Choley and lysis of adhesions. No CM needs identified at this time.    Action/Plan:  DC order placed for today Expected Discharge Date:                  Expected Discharge Plan:  Home/Self Care  In-House Referral:  NA  Discharge planning Services  CM Consult, NA  Post Acute Care Choice:  NA Choice offered to:  NA  DME Arranged:  N/A DME Agency:  NA  HH Arranged:  NA HH Agency:  NA  Status of Service:  Completed, signed off  If discussed at [place] of Stay Meetings, dates discussed:    Additional Comments:  Carles Collet, RN 02/13/2016, 1:41 PM

## 2016-02-13 NOTE — Progress Notes (Signed)
1 Day Post-Op  Subjective: On HD.  A little sore.  Tolerating liquids.  Objective: Vital signs in last 24 hours: Temp:  [97.9 F (36.6 C)-98.7 F (37.1 C)] 97.9 F (36.6 C) (09/22 0650) Pulse Rate:  [61-83] 77 (09/22 0900) Resp:  [15-29] 25 (09/22 0900) BP: (74-159)/(52-106) 74/52 (09/22 0900) SpO2:  [95 %-100 %] 100 % (09/22 0650) Weight:  [108.9 kg (240 lb)-112.1 kg (247 lb 2.2 oz)] 112.1 kg (247 lb 2.2 oz) (09/22 0650) Last BM Date: 02/11/16  Intake/Output from previous day: 09/21 0701 - 09/22 0700 In: 515 [I.V.:515] Out: 25 [Blood:25] Intake/Output this shift: No intake/output data recorded.  PE: General- In NAD Abdomen-soft, no significant tenderness, dressings dry  Lab Results:   Recent Labs  02/12/16 1116 02/13/16 0738  WBC 4.3 4.8  HGB 13.0 11.6*  HCT 40.8 37.2*  PLT 181 171   BMET  Recent Labs  02/12/16 1116 02/13/16 0738  NA 137 133*  K 5.6* 5.7*  CL 100* 97*  CO2 28 28  GLUCOSE 86 102*  BUN 21* 28*  CREATININE 10.34* 12.23*  CALCIUM 9.7 9.2   PT/INR  Recent Labs  02/12/16 1116  LABPROT 13.2  INR 1.00   Comprehensive Metabolic Panel:    Component Value Date/Time   NA 133 (L) 02/13/2016 0738   NA 137 02/12/2016 1116   K 5.7 (H) 02/13/2016 0738   K 5.6 (H) 02/12/2016 1116   CL 97 (L) 02/13/2016 0738   CL 100 (L) 02/12/2016 1116   CO2 28 02/13/2016 0738   CO2 28 02/12/2016 1116   BUN 28 (H) 02/13/2016 0738   BUN 21 (H) 02/12/2016 1116   CREATININE 12.23 (H) 02/13/2016 0738   CREATININE 10.34 (H) 02/12/2016 1116   GLUCOSE 102 (H) 02/13/2016 0738   GLUCOSE 86 02/12/2016 1116   CALCIUM 9.2 02/13/2016 0738   CALCIUM 9.7 02/12/2016 1116   AST 17 02/12/2016 1116   AST 21 09/07/2015 1539   ALT 13 (L) 02/12/2016 1116   ALT 21 09/07/2015 1539   ALKPHOS 60 02/12/2016 1116   ALKPHOS 85 09/07/2015 1539   BILITOT 0.9 02/12/2016 1116   BILITOT 1.0 09/07/2015 1539   PROT 7.6 02/12/2016 1116   PROT 6.8 09/07/2015 1539   ALBUMIN 2.8 (L)  02/13/2016 0738   ALBUMIN 3.3 (L) 02/12/2016 1116     Studies/Results: Dg Cholangiogram Operative  Result Date: 02/13/2016 CLINICAL DATA:  Biliary dyskinesia EXAM: INTRAOPERATIVE CHOLANGIOGRAM TECHNIQUE: Cholangiographic images from the C-arm fluoroscopic device were submitted for interpretation post-operatively. Please see the procedural report for the amount of contrast and the fluoroscopy time utilized. COMPARISON:  None. FINDINGS: Contrast fills the biliary tree and duodenum without filling defects in the common bile duct. IMPRESSION: Patent biliary tree. Electronically Signed   By: Marybelle Killings M.D.   On: 02/13/2016 08:28    Anti-infectives: Anti-infectives    Start     Dose/Rate Route Frequency Ordered Stop   02/12/16 1330  ceFAZolin (ANCEF) IVPB 2g/100 mL premix     2 g 200 mL/hr over 30 Minutes Intravenous To [facility]Stay [facility] 02/11/16 1325 02/12/16 1559      Assessment Active Problems:   Biliary dyskinesia s/p lap chole with IOC 02/12/16-looks good this AM    ESRD-on HD this AM.    LOS: 1 day   Plan: Discharge after lunch if he continues to do well.  Instructions given to him.   Anea Fodera Lenna Sciara 02/13/2016

## 2016-02-13 NOTE — Procedures (Signed)
  I was present at this dialysis session, have reviewed the session itself and made  appropriate changes Kelly Splinter MD [place] pager [phone]    cell [phone] 02/13/2016, 10:43 AM

## 2016-02-15 NOTE — Anesthesia Postprocedure Evaluation (Signed)
Anesthesia Post Note  Patient: [name]  Procedure(s) Performed: Procedure(s) (LRB): LAPAROSCOPIC CHOLECYSTECTOMY WITH INTRAOPERATIVE CHOLANGIOGRAM (N/A) LAPAROSCOPIC LYSIS OF ADHESIONS (N/A)  Patient location during evaluation: PACU Anesthesia Type: General Level of consciousness: awake and alert Pain management: pain level controlled Vital Signs Assessment: post-procedure vital signs reviewed and stable Respiratory status: spontaneous breathing, nonlabored ventilation, respiratory function stable and patient connected to nasal cannula oxygen Cardiovascular status: blood pressure returned to baseline and stable Postop Assessment: no signs of nausea or vomiting Anesthetic complications: no    Last Vitals:  Vitals:   02/13/16 1100 02/13/16 1122  BP: (!) 87/57 99/63  Pulse: 79 73  Resp: 19 (!) 25  Temp:  36.6 C    Last Pain:  Vitals:   02/13/16 1600  TempSrc:   PainSc: 10-Worst pain ever                 Angenette Daily,W. EDMOND

## 2016-02-17 NOTE — Discharge Summary (Signed)
Physician Discharge Summary  Patient ID: [name] MRN: [id number] DOB/AGE: 30-Sep-1960 35 y.o.  Admit date: 02/12/2016 Discharge date: 02/13/2016  Admission Diagnoses:  Biliary dyskinesia  Discharge Diagnoses:  Active Problems:   Biliary dyskinesia s/p laparoscopic lysis of adhesions and cholecystectomy with cholangiogram 02/12/16   ESRD   CHF   Gout   DM II   Atrial flutter   Discharged Condition: good  Hospital Course: He underwent the above procedure and tolerated it well.  He underwent hemodialysis on POD #1 and was able to be discharged later that day.  Discharge instructions were given to him.  Discharge Exam: Blood pressure 99/63, pulse 73, temperature 97.9 F (36.6 C), temperature source Oral, resp. rate (!) 25, height 6\' 1"  (1.854 m), weight 110.6 kg (243 lb 13.3 oz), SpO2 99 %.   Disposition: 01-Home or Self Care     Medication List    STOP taking these medications   warfarin 6 MG tablet Commonly known as:  COUMADIN     TAKE these medications   allopurinol 300 MG tablet Commonly known as:  ZYLOPRIM Take 150 mg by mouth daily.   amiodarone 200 MG tablet Commonly known as:  PACERONE Take 0.5 tablets (100 mg total) by mouth daily.   amLODipine 5 MG tablet Commonly known as:  NORVASC Take 5 mg by mouth daily.   carvedilol 25 MG tablet Commonly known as:  COREG Take 0.5 tablets (12.5 mg total) by mouth See admin instructions. Take 1/2 tablet (12.5 mg) by mouth in the evening on dialysis (Monday, Wednesday, Friday and 1/2 tablet (12.5 mg) twice daily on Tuesday, Thursday, Saturday, Sunday (non-dialysis days)   cinacalcet 90 MG tablet Commonly known as:  SENSIPAR Take 90 mg by mouth daily.   isosorbide dinitrate 20 MG tablet Commonly known as:  ISORDIL Take 20 mg by mouth daily.   lidocaine-prilocaine cream Commonly known as:  EMLA Apply 1 application topically every Monday, Wednesday, and Friday. Apply to port access prior to dialysis    nitroGLYCERIN 0.4 MG SL tablet Commonly known as:  NITROSTAT Place 0.4 mg under the tongue every 5 (five) minutes as needed for chest pain.   oxyCODONE-acetaminophen 5-325 MG tablet Commonly known as:  PERCOCET/ROXICET Take 1-2 tablets by mouth every 4 (four) hours as needed for moderate pain. What changed:  how much to take  when to take this   pantoprazole 40 MG tablet Commonly known as:  PROTONIX Take 1 tablet (40 mg total) by mouth 2 (two) times daily.   sevelamer carbonate 800 MG tablet Commonly known as:  RENVELA Take 3 tablets (2,400 mg total) by mouth 3 (three) times daily with meals.   VITAMIN D PO Take 1 tablet by mouth daily.        Signed: Odis Hollingshead 02/17/2016, 1:39 PM

## 2016-02-18 DIAGNOSIS — I4892 Unspecified atrial flutter: Secondary | ICD-10-CM | POA: Diagnosis not present

## 2016-02-20 ENCOUNTER — Encounter: Payer: Self-pay | Admitting: Vascular Surgery

## 2016-02-20 ENCOUNTER — Ambulatory Visit (INDEPENDENT_AMBULATORY_CARE_PROVIDER_SITE_OTHER): Payer: Medicare Other | Admitting: Vascular Surgery

## 2016-02-20 VITALS — BP 139/91 | HR 68 | Temp 97.5°F | Resp 16 | Ht 73.0 in | Wt 240.0 lb

## 2016-02-20 DIAGNOSIS — N186 End stage renal disease: Secondary | ICD-10-CM

## 2016-02-20 DIAGNOSIS — Z992 Dependence on renal dialysis: Secondary | ICD-10-CM

## 2016-02-20 NOTE — Progress Notes (Signed)
Patient name: [name] MRN: [id number] DOB: 08/23/1960 Sex: male  REASON FOR VISIT: Postop  HPI: [name] is a 55 y.o. male who presents her postoperative visit status post revision of left forearm AV fistula. He had an area of ulceration towards the middle of his left arm fistula. He did not have a bleeding episode in the past but was felt to be at risk for bleeding. He has been dialyzing through his fistula without complications. He denies any bleeding episodes. He recently had gallbladder surgery and is doing well.  Current Outpatient Prescriptions  Medication Sig Dispense Refill  . allopurinol (ZYLOPRIM) 300 MG tablet Take 150 mg by mouth daily.  6  . amiodarone (PACERONE) 200 MG tablet Take 0.5 tablets (100 mg total) by mouth daily. 15 tablet 6  . amLODipine (NORVASC) 5 MG tablet Take 5 mg by mouth daily.     . carvedilol (COREG) 25 MG tablet Take 0.5 tablets (12.5 mg total) by mouth See admin instructions. Take 1/2 tablet (12.5 mg) by mouth in the evening on dialysis (Monday, Wednesday, Friday and 1/2 tablet (12.5 mg) twice daily on Tuesday, Thursday, Saturday, Sunday (non-dialysis days)    . Cholecalciferol (VITAMIN D PO) Take 1 tablet by mouth daily.    . cinacalcet (SENSIPAR) 90 MG tablet Take 90 mg by mouth daily.     . isosorbide dinitrate (ISORDIL) 20 MG tablet Take 20 mg by mouth daily.     Marland Kitchen lidocaine-prilocaine (EMLA) cream Apply 1 application topically every Monday, Wednesday, and Friday. Apply to port access prior to dialysis  12  . nitroGLYCERIN (NITROSTAT) 0.4 MG SL tablet Place 0.4 mg under the tongue every 5 (five) minutes as needed for chest pain.   0  . oxyCODONE-acetaminophen (PERCOCET/ROXICET) 5-325 MG tablet Take 1-2 tablets by mouth every 4 (four) hours as needed for moderate pain. 30 tablet 0  . pantoprazole (PROTONIX) 40 MG tablet Take 1 tablet (40 mg total) by mouth 2 (two) times daily. 60 tablet 1  . sevelamer carbonate (RENVELA) 800 MG tablet Take 3 tablets  (2,400 mg total) by mouth 3 (three) times daily with meals. 270 tablet 3   No current facility-administered medications for this visit.     REVIEW OF SYSTEMS:  [X]  denotes positive finding, [ ]  denotes negative finding Cardiac  Comments:  Chest pain or chest pressure:    Shortness of breath upon exertion:    Short of breath when lying flat:    Irregular heart rhythm:    Constitutional    Fever or chills:      PHYSICAL EXAM: Vitals:   02/20/16 1245  BP: (!) 139/91  Pulse: 68  Resp: 16  Temp: 97.5 F (36.4 C)  TempSrc: Oral  SpO2: 100%  Weight: 240 lb (108.9 kg)  Height: 6\' 1"  (1.854 m)    GENERAL: The patient is a well-nourished male, in no acute distress. The vital signs are documented above. PULMONARY: Nonlabored respiratory effort. VASCULAR:  Palpable (although somewhat pulsatile) thrill left forearm fistula. Incision is well-healed. 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   MEDICAL ISSUES: Status post revision of left arm AV fistula  The patient is using his left radiocephalic fistula without complications. His incision is well-healed. His fistula may be cannulated underneath the incision. He will follow-up on an as needed basis.   Virgina Jock, PA-C [facility]    I have independently interviewed patient and agree with  PA assessment and plan above. Ok to used fistula. It does feel pulsatile so any issues with dialysis may require fistulogram.   Lylia Karn C. Donzetta Matters, MD [facility] Office: [phone] Pager: [phone]

## 2016-02-21 DIAGNOSIS — N186 End stage renal disease: Secondary | ICD-10-CM | POA: Diagnosis not present

## 2016-02-21 DIAGNOSIS — Z992 Dependence on renal dialysis: Secondary | ICD-10-CM | POA: Diagnosis not present

## 2016-02-21 DIAGNOSIS — I120 Hypertensive chronic kidney disease with stage 5 chronic kidney disease or end stage renal disease: Secondary | ICD-10-CM | POA: Diagnosis not present

## 2016-02-23 DIAGNOSIS — N186 End stage renal disease: Secondary | ICD-10-CM | POA: Diagnosis not present

## 2016-02-23 DIAGNOSIS — D631 Anemia in chronic kidney disease: Secondary | ICD-10-CM | POA: Diagnosis not present

## 2016-02-23 DIAGNOSIS — N2581 Secondary hyperparathyroidism of renal origin: Secondary | ICD-10-CM | POA: Diagnosis not present

## 2016-02-23 DIAGNOSIS — E1129 Type 2 diabetes mellitus with other diabetic kidney complication: Secondary | ICD-10-CM | POA: Diagnosis not present

## 2016-03-14 ENCOUNTER — Encounter (HOSPITAL_COMMUNITY): Payer: Self-pay | Admitting: Emergency Medicine

## 2016-03-14 ENCOUNTER — Ambulatory Visit (HOSPITAL_COMMUNITY)
Admission: EM | Admit: 2016-03-14 | Discharge: 2016-03-14 | Disposition: A | Discharge status: Nursing Home (Includes ICF) | Payer: Medicare Other | Attending: Family Medicine | Admitting: Family Medicine

## 2016-03-14 ENCOUNTER — Encounter (HOSPITAL_COMMUNITY): Payer: Self-pay

## 2016-03-14 ENCOUNTER — Inpatient Hospital Stay (HOSPITAL_COMMUNITY)
Admission: EM | Admit: 2016-03-14 | Discharge: 2016-03-21 | DRG: 391 | Disposition: A | Discharge status: Home / Self Care | Payer: Medicare Other | Attending: Internal Medicine | Admitting: Internal Medicine

## 2016-03-14 ENCOUNTER — Emergency Department (HOSPITAL_COMMUNITY): Payer: Medicare Other

## 2016-03-14 ENCOUNTER — Ambulatory Visit (INDEPENDENT_AMBULATORY_CARE_PROVIDER_SITE_OTHER): Payer: Medicare Other

## 2016-03-14 DIAGNOSIS — R1012 Left upper quadrant pain: Secondary | ICD-10-CM | POA: Diagnosis not present

## 2016-03-14 DIAGNOSIS — R1011 Right upper quadrant pain: Principal | ICD-10-CM | POA: Diagnosis present

## 2016-03-14 DIAGNOSIS — I420 Dilated cardiomyopathy: Secondary | ICD-10-CM | POA: Diagnosis present

## 2016-03-14 DIAGNOSIS — Z841 Family history of disorders of kidney and ureter: Secondary | ICD-10-CM

## 2016-03-14 DIAGNOSIS — R0781 Pleurodynia: Secondary | ICD-10-CM

## 2016-03-14 DIAGNOSIS — M545 Low back pain, unspecified: Secondary | ICD-10-CM

## 2016-03-14 DIAGNOSIS — E119 Type 2 diabetes mellitus without complications: Secondary | ICD-10-CM

## 2016-03-14 DIAGNOSIS — I120 Hypertensive chronic kidney disease with stage 5 chronic kidney disease or end stage renal disease: Secondary | ICD-10-CM | POA: Diagnosis not present

## 2016-03-14 DIAGNOSIS — K839 Disease of biliary tract, unspecified: Secondary | ICD-10-CM | POA: Diagnosis present

## 2016-03-14 DIAGNOSIS — E1122 Type 2 diabetes mellitus with diabetic chronic kidney disease: Secondary | ICD-10-CM | POA: Diagnosis present

## 2016-03-14 DIAGNOSIS — R05 Cough: Secondary | ICD-10-CM

## 2016-03-14 DIAGNOSIS — N186 End stage renal disease: Secondary | ICD-10-CM | POA: Diagnosis not present

## 2016-03-14 DIAGNOSIS — Q600 Renal agenesis, unilateral: Secondary | ICD-10-CM

## 2016-03-14 DIAGNOSIS — I5022 Chronic systolic (congestive) heart failure: Secondary | ICD-10-CM | POA: Diagnosis present

## 2016-03-14 DIAGNOSIS — N2581 Secondary hyperparathyroidism of renal origin: Secondary | ICD-10-CM | POA: Diagnosis present

## 2016-03-14 DIAGNOSIS — Z9049 Acquired absence of other specified parts of digestive tract: Secondary | ICD-10-CM

## 2016-03-14 DIAGNOSIS — R059 Cough, unspecified: Secondary | ICD-10-CM

## 2016-03-14 DIAGNOSIS — D638 Anemia in other chronic diseases classified elsewhere: Secondary | ICD-10-CM | POA: Diagnosis present

## 2016-03-14 DIAGNOSIS — K5730 Diverticulosis of large intestine without perforation or abscess without bleeding: Secondary | ICD-10-CM | POA: Diagnosis not present

## 2016-03-14 DIAGNOSIS — K9189 Other postprocedural complications and disorders of digestive system: Secondary | ICD-10-CM | POA: Diagnosis present

## 2016-03-14 DIAGNOSIS — E669 Obesity, unspecified: Secondary | ICD-10-CM | POA: Diagnosis present

## 2016-03-14 DIAGNOSIS — R7989 Other specified abnormal findings of blood chemistry: Secondary | ICD-10-CM | POA: Diagnosis present

## 2016-03-14 DIAGNOSIS — Z8249 Family history of ischemic heart disease and other diseases of the circulatory system: Secondary | ICD-10-CM

## 2016-03-14 DIAGNOSIS — Z905 Acquired absence of kidney: Secondary | ICD-10-CM

## 2016-03-14 DIAGNOSIS — R778 Other specified abnormalities of plasma proteins: Secondary | ICD-10-CM | POA: Diagnosis present

## 2016-03-14 DIAGNOSIS — Z992 Dependence on renal dialysis: Secondary | ICD-10-CM

## 2016-03-14 DIAGNOSIS — I10 Essential (primary) hypertension: Secondary | ICD-10-CM | POA: Diagnosis present

## 2016-03-14 DIAGNOSIS — K219 Gastro-esophageal reflux disease without esophagitis: Secondary | ICD-10-CM | POA: Diagnosis present

## 2016-03-14 DIAGNOSIS — I4892 Unspecified atrial flutter: Secondary | ICD-10-CM | POA: Diagnosis present

## 2016-03-14 DIAGNOSIS — Z6833 Body mass index (BMI) 33.0-33.9, adult: Secondary | ICD-10-CM

## 2016-03-14 DIAGNOSIS — M546 Pain in thoracic spine: Secondary | ICD-10-CM

## 2016-03-14 DIAGNOSIS — R071 Chest pain on breathing: Secondary | ICD-10-CM | POA: Diagnosis not present

## 2016-03-14 DIAGNOSIS — Z888 Allergy status to other drugs, medicaments and biological substances status: Secondary | ICD-10-CM

## 2016-03-14 DIAGNOSIS — R109 Unspecified abdominal pain: Secondary | ICD-10-CM

## 2016-03-14 DIAGNOSIS — E875 Hyperkalemia: Secondary | ICD-10-CM | POA: Diagnosis present

## 2016-03-14 DIAGNOSIS — Z9861 Coronary angioplasty status: Secondary | ICD-10-CM

## 2016-03-14 DIAGNOSIS — R0789 Other chest pain: Secondary | ICD-10-CM

## 2016-03-14 DIAGNOSIS — E8889 Other specified metabolic disorders: Secondary | ICD-10-CM | POA: Diagnosis present

## 2016-03-14 DIAGNOSIS — Z7901 Long term (current) use of anticoagulants: Secondary | ICD-10-CM

## 2016-03-14 DIAGNOSIS — I132 Hypertensive heart and chronic kidney disease with heart failure and with stage 5 chronic kidney disease, or end stage renal disease: Secondary | ICD-10-CM | POA: Diagnosis present

## 2016-03-14 DIAGNOSIS — I2510 Atherosclerotic heart disease of native coronary artery without angina pectoris: Secondary | ICD-10-CM | POA: Diagnosis present

## 2016-03-14 DIAGNOSIS — K838 Other specified diseases of biliary tract: Secondary | ICD-10-CM | POA: Diagnosis present

## 2016-03-14 DIAGNOSIS — H5461 Unqualified visual loss, right eye, normal vision left eye: Secondary | ICD-10-CM | POA: Diagnosis present

## 2016-03-14 DIAGNOSIS — E871 Hypo-osmolality and hyponatremia: Secondary | ICD-10-CM | POA: Diagnosis present

## 2016-03-14 DIAGNOSIS — Z8673 Personal history of transient ischemic attack (TIA), and cerebral infarction without residual deficits: Secondary | ICD-10-CM

## 2016-03-14 DIAGNOSIS — M109 Gout, unspecified: Secondary | ICD-10-CM | POA: Diagnosis present

## 2016-03-14 DIAGNOSIS — I252 Old myocardial infarction: Secondary | ICD-10-CM

## 2016-03-14 DIAGNOSIS — R0602 Shortness of breath: Secondary | ICD-10-CM | POA: Diagnosis not present

## 2016-03-14 DIAGNOSIS — R748 Abnormal levels of other serum enzymes: Secondary | ICD-10-CM | POA: Diagnosis present

## 2016-03-14 DIAGNOSIS — Z85528 Personal history of other malignant neoplasm of kidney: Secondary | ICD-10-CM

## 2016-03-14 LAB — COMPREHENSIVE METABOLIC PANEL
ALT: 12 U/L — ABNORMAL LOW (ref 17–63)
AST: 13 U/L — ABNORMAL LOW (ref 15–41)
Albumin: 3.4 g/dL — ABNORMAL LOW (ref 3.5–5.0)
Alkaline Phosphatase: 101 U/L (ref 38–126)
Anion gap: 11 (ref 5–15)
BUN: 50 mg/dL — ABNORMAL HIGH (ref 6–20)
CO2: 25 mmol/L (ref 22–32)
Calcium: 8.1 mg/dL — ABNORMAL LOW (ref 8.9–10.3)
Chloride: 100 mmol/L — ABNORMAL LOW (ref 101–111)
Creatinine, Ser: 13.4 mg/dL — ABNORMAL HIGH (ref 0.61–1.24)
GFR calc Af Amer: 4 mL/min — ABNORMAL LOW (ref >60)
GFR calc non Af Amer: 4 mL/min — ABNORMAL LOW (ref >60)
Glucose, Bld: 98 mg/dL (ref 65–99)
Potassium: 4.6 mmol/L (ref 3.5–5.1)
Sodium: 136 mmol/L (ref 135–145)
Total Bilirubin: 0.9 mg/dL (ref 0.3–1.2)
Total Protein: 7.3 g/dL (ref 6.5–8.1)

## 2016-03-14 LAB — CBC WITH DIFFERENTIAL/PLATELET
Basophils Absolute: 0 10*3/uL (ref 0.0–0.1)
Basophils Relative: 0 %
Eosinophils Absolute: 0.2 10*3/uL (ref 0.0–0.7)
Eosinophils Relative: 4 %
HCT: 34.6 % — ABNORMAL LOW (ref 39.0–52.0)
Hemoglobin: 11.3 g/dL — ABNORMAL LOW (ref 13.0–17.0)
Lymphocytes Relative: 26 %
Lymphs Abs: 1.2 10*3/uL (ref 0.7–4.0)
MCH: 31.5 pg (ref 26.0–34.0)
MCHC: 32.7 g/dL (ref 30.0–36.0)
MCV: 96.4 fL (ref 78.0–100.0)
Monocytes Absolute: 0.4 10*3/uL (ref 0.1–1.0)
Monocytes Relative: 9 %
Neutro Abs: 2.9 10*3/uL (ref 1.7–7.7)
Neutrophils Relative %: 61 %
Platelets: 144 10*3/uL — ABNORMAL LOW (ref 150–400)
RBC: 3.59 MIL/uL — ABNORMAL LOW (ref 4.22–5.81)
RDW: 15 % (ref 11.5–15.5)
WBC: 4.7 10*3/uL (ref 4.0–10.5)

## 2016-03-14 LAB — PROTIME-INR
INR: 1.04
Prothrombin Time: 13.6 seconds (ref 11.4–15.2)

## 2016-03-14 LAB — LIPASE, BLOOD: Lipase: 125 U/L — ABNORMAL HIGH (ref 11–51)

## 2016-03-14 LAB — TROPONIN I: Troponin I: 0.22 ng/mL (ref <0.03)

## 2016-03-14 MED ORDER — HYDROMORPHONE HCL 2 MG/ML IJ SOLN
1.0000 mg | Freq: Once | INTRAMUSCULAR | Status: AC
  Administered 2016-03-15: 1 mg via INTRAVENOUS
  Filled 2016-03-14: qty 1

## 2016-03-14 NOTE — ED Notes (Signed)
Patient transported to X-ray 

## 2016-03-14 NOTE — ED Provider Notes (Signed)
[facility] DEPT Provider Note   CSN: [id number] Arrival date & time: 03/14/16  1948     History   Chief Complaint Chief Complaint  Patient presents with  . Cough  . Chest Pain    HPI [name] is a 55 y.o. male.  Patient with history of heart failure, end-stage renal disease regularly dialyzed this Monday Wednesday Friday, diabetes, current myopathy, nephrectomy, stroke, recent gallbladder surgery 2 weeks ago due to nonfunctioning gallbladder presents for multiple different complaints. Patient's had recurrent cough that's been worsening the past week with pleuritic chest pain component worse on the right. Patient's had worsening left abdominal pain near incision site for the past week as well. No fevers or chills, no blood clot history patient is on Coumadin.      Past Medical History:  Diagnosis Date  . Atrial flutter ([place])   . 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 Cardiomyopathy   . CHF (congestive heart failure) ([place])   . Coronary artery disease    normal coronaries by 10/10/08 cath, Cardiac Cath 08-04-12 epic.Dr. Doylene Canard follows  . Diabetes mellitus    pt. states he's borderline diabetic., no longer taking med,- off med. since 2013  . Dialysis patient [facility])    Mon-Wed-Fri([place])- Left AV fistula  . Diverticulitis November 2016   reoccurred in December 2016  . ESRD (end stage renal disease) ([place])   . GERD (gastroesophageal reflux disease)   . Gout   . History of nephrectomy 07/04/2012   History of right nephrectomy in 2002 for renal cell carcinoma   . History of unilateral nephrectomy   . Hypertension   . Low iron   . Myocardial infarction ?2006  . Renal cell carcinoma    dialysis- MWF- Dr. Mercy Moore follows.  . Renal insufficiency   . Shortness of breath 05/19/11   "at rest, lying down, w/exertion"  . Stroke [facility]) 02/2011   05/19/11 denies residual  . Umbilical hernia 57/32/20   unrepaired    Patient Active  Problem List   Diagnosis Date Noted  . Biliary dyskinesia 02/12/2016  . Atrial flutter with rapid ventricular response ([place]) 06/27/2015  . Diverticulitis 04/21/2015  . Acute diverticulitis 04/21/2015  . Diverticulitis of intestine without perforation or abscess without bleeding 04/21/2015  . Pulmonary edema 01/13/2015  . RUQ pain 01/13/2015  . Acute respiratory failure with hypoxia ([place]) 01/13/2015  . Diverticulitis large intestine w/o perforation or abscess w/o bleeding 12/18/2014  . Hepatic cyst 12/18/2014  . Aftercare following surgery of the circulatory system, [place] 06/22/2013  . End stage renal disease ([place]) 11/28/2012  . Right sided weakness 10/05/2012  . History of nephrectomy 07/04/2012  . End-stage renal disease on hemodialysis ([place]) 06/04/2011  . Systolic CHF, chronic ([place]) 05/19/2011    Class: Acute  . Hypertension, benign 05/19/2011    Class: Chronic  . DM II (diabetes mellitus, type II), controlled ([place]) 05/19/2011    Class: Chronic  . Gout, chronic 05/19/2011    Past Surgical History:  Procedure Laterality Date  . AV FISTULA PLACEMENT  03/29/2011   Procedure: ARTERIOVENOUS (AV) FISTULA CREATION;  Surgeon: Hinda Lenis, MD;  Location: [place];  Service: Vascular;  Laterality: Left;  LEFT RADIOCEPHALIC , Arteriovenous [id number])  . CARDIAC CATHETERIZATION  05/21/11  . CHOLECYSTECTOMY N/A 02/12/2016   Procedure: LAPAROSCOPIC CHOLECYSTECTOMY WITH INTRAOPERATIVE CHOLANGIOGRAM;  Surgeon: Jackolyn Confer, MD;  Location: [place];  Service: General;  Laterality: N/A;  . dialysis cath placed    .  ESOPHAGOGASTRODUODENOSCOPY (EGD) WITH PROPOFOL N/A 12/02/2015   Procedure: ESOPHAGOGASTRODUODENOSCOPY (EGD) WITH PROPOFOL;  Surgeon: Juanita Craver, MD;  Location: [facility];  Service: Endoscopy;  Laterality: N/A;  . FINGER SURGERY     L pinkie finger- ORIF- /w remaining hardware - 1990's    . HERNIA REPAIR N/A 3/53/6144   Umbilical hernia repair  . INSERTION OF DIALYSIS  CATHETER N/A 01/27/2016   Procedure: INSERTION OF DIALYSIS CATHETER;  Surgeon: Waynetta Sandy, MD;  Location: [place];  Service: Vascular;  Laterality: N/A;  . INSERTION OF MESH N/A 07/04/2012   Procedure: INSERTION OF MESH;  Surgeon: Madilyn Hook, DO;  Location: [place];  Service: General;  Laterality: N/A;  . LAPAROSCOPIC LYSIS OF ADHESIONS N/A 02/12/2016   Procedure: LAPAROSCOPIC LYSIS OF ADHESIONS;  Surgeon: Jackolyn Confer, MD;  Location: [place];  Service: General;  Laterality: N/A;  . LEFT AND RIGHT HEART CATHETERIZATION WITH CORONARY ANGIOGRAM N/A 08/04/2012   Procedure: LEFT AND RIGHT HEART CATHETERIZATION WITH CORONARY ANGIOGRAM;  Surgeon: Birdie Riddle, MD;  Location: [place] CATH LAB;  Service: Cardiovascular;  Laterality: N/A;  . NEPHRECTOMY  2000   right  . REVISON OF ARTERIOVENOUS FISTULA Left 06/05/2013   Procedure: REVISON OF LEFT RADIOCEPHALIC  ARTERIOVENOUS FISTULA;  Surgeon: Conrad , MD;  Location: [place];  Service: Vascular;  Laterality: Left;  . REVISON OF ARTERIOVENOUS FISTULA Left 01/27/2016   Procedure: REVISION OF LEFT UPPER EXTREMITY ARTERIOVENOUS FISTULA;  Surgeon: Waynetta Sandy, MD;  Location: [place];  Service: Vascular;  Laterality: Left;  . RIGHT HEART CATHETERIZATION N/A 05/21/2011   Procedure: RIGHT HEART CATH;  Surgeon: Birdie Riddle, MD;  Location: [facility] CATH LAB;  Service: Cardiovascular;  Laterality: N/A;  . smashed  1990's   "left pinky; have a plate in there"  . UMBILICAL HERNIA REPAIR N/A 07/04/2012   Procedure: LAPAROSCOPIC UMBILICAL HERNIA;  Surgeon: Madilyn Hook, DO;  Location: [place];  Service: General;  Laterality: N/A;  . US ECHOCARDIOGRAPHY  05/20/11       Home Medications    Prior to Admission medications   Medication Sig Start Date End Date Taking? Authorizing Provider  allopurinol (ZYLOPRIM) 300 MG tablet Take 150 mg by mouth daily. 12/07/15  Yes Historical Provider, MD  amiodarone (PACERONE) 200 MG tablet Take 0.5 tablets (100 mg  total) by mouth daily. 07/05/15  Yes Dixie Dials, MD  amLODipine (NORVASC) 5 MG tablet Take 5 mg by mouth daily.    Yes Historical Provider, MD  carvedilol (COREG) 25 MG tablet Take 0.5 tablets (12.5 mg total) by mouth See admin instructions. Take 1/2 tablet (12.5 mg) by mouth in the evening on dialysis (Monday, Wednesday, Friday and 1/2 tablet (12.5 mg) twice daily on Tuesday, Thursday, Saturday, Sunday (non-dialysis days) 07/05/15  Yes Dixie Dials, MD  Cholecalciferol (VITAMIN D PO) Take 1 tablet by mouth daily.   Yes Historical Provider, MD  cinacalcet (SENSIPAR) 90 MG tablet Take 90 mg by mouth daily.    Yes Historical Provider, MD  isosorbide dinitrate (ISORDIL) 20 MG tablet Take 20 mg by mouth daily.    Yes Historical Provider, MD  lidocaine-prilocaine (EMLA) cream Apply 1 application topically every Monday, Wednesday, and Friday. Apply to port access prior to dialysis 04/08/15  Yes Historical Provider, MD  nitroGLYCERIN (NITROSTAT) 0.4 MG SL tablet Place 0.4 mg under the tongue every 5 (five) minutes as needed for chest pain.  05/20/15  Yes Historical Provider, MD  oxyCODONE-acetaminophen (PERCOCET/ROXICET) 5-325 MG tablet Take 1-2 tablets by  mouth every 4 (four) hours as needed for moderate pain. 02/13/16  Yes Jackolyn Confer, MD  pantoprazole (PROTONIX) 40 MG tablet Take 1 tablet (40 mg total) by mouth 2 (two) times daily. 01/27/16  Yes Alvia Grove, PA-C  sevelamer carbonate (RENVELA) 800 MG tablet Take 3 tablets (2,400 mg total) by mouth 3 (three) times daily with meals. 07/05/15  Yes Dixie Dials, MD  warfarin (COUMADIN) 6 MG tablet Take 9 mg by mouth daily. 02/22/16  Yes Historical Provider, MD    Family History Family History  Problem Relation Age of Onset  . Hypertension Mother   . Kidney disease Mother     Social History Social History  Substance Use Topics  . Smoking status: Never Smoker  . Smokeless tobacco: Never Used  . Alcohol use No     Allergies   Ace  inhibitors   Review of Systems Review of Systems  Constitutional: Negative for chills and fever.  HENT: Negative for ear pain and sore throat.   Eyes: Negative for pain and visual disturbance.  Respiratory: Positive for cough (mild productive) and shortness of breath.   Cardiovascular: Positive for chest pain. Negative for palpitations and leg swelling.  Gastrointestinal: Positive for abdominal pain. Negative for vomiting.  Genitourinary: Negative for dysuria and hematuria.  Musculoskeletal: Negative for arthralgias and back pain.  Skin: Negative for color change and rash.  Neurological: Negative for seizures and syncope.  All other systems reviewed and are negative.    Physical Exam Updated Vital Signs BP 154/90   Pulse 74   Temp 98.2 F (36.8 C) (Oral)   Resp 14   Ht 6\' 1"  (1.854 m)   Wt 263 lb 14.4 oz (119.7 kg)   SpO2 97%   BMI 34.82 kg/m   Physical Exam  Constitutional: He is oriented to person, place, and time. He appears well-developed and well-nourished.  HENT:  Head: Normocephalic and atraumatic.  Eyes: Conjunctivae are normal. Right eye exhibits no discharge. Left eye exhibits no discharge.  Neck: Normal range of motion. Neck supple. No tracheal deviation present.  Cardiovascular: Normal rate and regular rhythm.   Pulmonary/Chest: Effort normal.  Abdominal: Soft. He exhibits no distension. There is tenderness (mild left mid abdomen near incision site no sign of external infection). There is no guarding.  Musculoskeletal: He exhibits no edema.  Neurological: He is alert and oriented to person, place, and time.  Skin: Skin is warm. No rash noted.  Psychiatric: He has a normal mood and affect.  Nursing note and vitals reviewed.    ED Treatments / Results  Labs (all labs ordered are listed, but only abnormal results are displayed) Labs Reviewed  CBC WITH DIFFERENTIAL/PLATELET - Abnormal; Notable for the following:       Result Value   RBC 3.59 (*)     Hemoglobin 11.3 (*)    HCT 34.6 (*)    Platelets 144 (*)    All other components within normal limits  COMPREHENSIVE METABOLIC PANEL - Abnormal; Notable for the following:    Chloride 100 (*)    BUN 50 (*)    Creatinine, Ser 13.40 (*)    Calcium 8.1 (*)    Albumin 3.4 (*)    AST 13 (*)    ALT 12 (*)    GFR calc non Af Amer 4 (*)    GFR calc Af Amer 4 (*)    All other components within normal limits  LIPASE, BLOOD - Abnormal; Notable for the following:  Lipase 125 (*)    All other components within normal limits  TROPONIN I - Abnormal; Notable for the following:    Troponin I 0.22 (*)    All other components within normal limits  PROTIME-INR    EKG  EKG Interpretation None       Radiology Dg Chest 2 View  Result Date: 03/14/2016 CLINICAL DATA:  Shortness of breath EXAM: CHEST  2 VIEW COMPARISON:  None. FINDINGS: Slight elevation of the left diaphragm. There is mild to moderate enlargement of the cardiomediastinal silhouette. There is mild central vascular congestion. No pleural effusion or focal consolidation. Mild atherosclerosis of the aorta. No pneumothorax. IMPRESSION: Stable cardiomegaly with mild central vascular congestion. No overt edema, consolidation or effusion. Atherosclerotic vascular disease of the aorta. Electronically Signed   By: Donavan Foil M.D.   On: 03/14/2016 23:41   Dg Chest 2 View  Result Date: 03/14/2016 CLINICAL DATA:  Congestion for 5 days with productive cough, vomiting, nausea, right scapular pain, and right rib pain. EXAM: CHEST  2 VIEW COMPARISON:  09/07/2015 FINDINGS: Enlargement of the cardiac silhouette is unchanged. There is mild prominence of the central pulmonary arteries with decreased pulmonary vascular congestion compared to the prior study. There is no evidence confluent airspace opacity, edema, pleural effusion, or pneumothorax. No acute osseous abnormality is identified. IMPRESSION: Cardiomegaly with decreased pulmonary vascular  congestion. No evidence of edema or pneumonia. Electronically Signed   By: Logan Bores M.D.   On: 03/14/2016 18:53    Procedures Procedures (including critical care time)  Medications Ordered in ED Medications  HYDROmorphone (DILAUDID) injection 1 mg (1 mg Intravenous Given 03/15/16 0018)  iopamidol (ISOVUE-370) 76 % injection (80 mLs  Contrast Given 03/15/16 0053)     Initial Impression / Assessment and Plan / ED Course  I have reviewed the triage vital signs and the nursing notes.  Pertinent labs & imaging results that were available during my care of the patient were reviewed by me and considered in my medical decision making (see chart for details).  Clinical Course  Patient presents with cough/pleuritic chest pain. Discussed likely musculoskeletal however with recent surgery, embolism also in the differential diagnosis. Patient no respiratory distress, mild reproducible right back discomfort. Plan to check on her blood work headaches screen chest x-ray. Patient does have abdominal pain as well with recent surgery no external signs infection.  Plan to fup CT scans.    Medications  HYDROmorphone (DILAUDID) injection 1 mg (1 mg Intravenous Given 03/15/16 0018)  iopamidol (ISOVUE-370) 76 % injection (80 mLs  Contrast Given 03/15/16 0053)    Vitals:   03/14/16 2145 03/14/16 2230 03/15/16 0027 03/15/16 0030  BP: 154/89 143/90 153/93 154/90  Pulse: 80 71 77 74  Resp: 16 17 14 14   Temp:      TempSrc:      SpO2: 97% 98% 95% 97%  Weight:      Height:        Final diagnoses:  Rib pain on right side  Left upper quadrant abdominal pain of unknown etiology     Final Clinical Impressions(s) / ED Diagnoses   Final diagnoses:  Rib pain on right side  Left upper quadrant abdominal pain of unknown etiology    New Prescriptions New Prescriptions   No medications on file     Elnora Morrison, MD 03/20/16 1006

## 2016-03-14 NOTE — ED Notes (Signed)
Lab called for a critical troponin of 0.22.

## 2016-03-14 NOTE — ED Triage Notes (Signed)
Pt has had cough, sometimes productive yellow phlegm, x 8 weeks.  Gallbladder removed 1 month ago.  Cough has not gotten any better. Sometimes cough causes vomiting.  Onset early this morning coughing so hard right rib lateral side of abd hurts at rib cage area.  Pt had BM today with bright red blood on outside of stool.

## 2016-03-14 NOTE — ED Notes (Signed)
Report  Phoned  To  Karolee Stamps

## 2016-03-14 NOTE — ED Triage Notes (Addendum)
Complains of cough for 2 months.  Patient reports gallbladder surgery one month ago. Cough seemed to ease, but never went away.  Patient reports cough worsened 8 days ago.  Sometimes he cough so hard he vomits.  Patient reports dry cough most of the time.  Once in a while will have small amount of yellow phlegm.   Right side pain onset today.  Felt pinch in right back with coughing, gradually worsened .    Patient reports bm today and noted bright blood in stool.    Patient's last dialysis was Friday.    Pain is felt all the time, but does get worse with deep inspiration, cough or movement

## 2016-03-14 NOTE — ED Provider Notes (Addendum)
[place]    CSN: [id number] Arrival date & time: 03/14/16  1648     History   Chief Complaint Chief Complaint  Patient presents with  . Cough    HPI [name] is a 55 y.o. male.   The history is provided by the patient.  Cough  Cough characteristics:  Productive Sputum characteristics:  Yellow Severity:  Moderate Onset quality:  Gradual Duration:  8 weeks Progression:  Worsening Chronicity:  Chronic Smoker: no   Context comment:  S/p cholecyst approx 8mo ago with persistent cough Relieved by:  None tried Worsened by:  Deep breathing and activity Associated symptoms: chest pain and shortness of breath   Associated symptoms: no fever     Past Medical History:  Diagnosis Date  . Atrial flutter ([place])   . 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 Cardiomyopathy   . CHF (congestive heart failure) ([place])   . Coronary artery disease    normal coronaries by 10/10/08 cath, Cardiac Cath 08-04-12 epic.Dr. Doylene Canard follows  . Diabetes mellitus    pt. states he's borderline diabetic., no longer taking med,- off med. since 2013  . Dialysis patient [facility])    Mon-Wed-Fri([place])- Left AV fistula  . Diverticulitis November 2016   reoccurred in December 2016  . ESRD (end stage renal disease) ([place])   . GERD (gastroesophageal reflux disease)   . Gout   . History of nephrectomy 07/04/2012   History of right nephrectomy in 2002 for renal cell carcinoma   . History of unilateral nephrectomy   . Hypertension   . Low iron   . Myocardial infarction ?2006  . Renal cell carcinoma    dialysis- MWF- Dr. Mercy Moore follows.  . Renal insufficiency   . Shortness of breath 05/19/11   "at rest, lying down, w/exertion"  . Stroke [facility]) 02/2011   05/19/11 denies residual  . Umbilical hernia 09/32/35   unrepaired    Patient Active Problem List   Diagnosis Date Noted  . Bile leak, postoperative 03/15/2016  . Elevated troponin 03/15/2016  .  Biliary dyskinesia 02/12/2016  . Atrial flutter with rapid ventricular response ([place]) 06/27/2015  . Diverticulitis 04/21/2015  . Acute diverticulitis 04/21/2015  . Diverticulitis of intestine without perforation or abscess without bleeding 04/21/2015  . Pulmonary edema 01/13/2015  . RUQ pain 01/13/2015  . Acute respiratory failure with hypoxia ([place]) 01/13/2015  . Diverticulitis large intestine w/o perforation or abscess w/o bleeding 12/18/2014  . Hepatic cyst 12/18/2014  . Aftercare following surgery of the circulatory system, [place] 06/22/2013  . End stage renal disease ([place]) 11/28/2012  . Right sided weakness 10/05/2012  . History of nephrectomy 07/04/2012  . End-stage renal disease on hemodialysis ([place]) 06/04/2011  . Systolic CHF, chronic ([place]) 05/19/2011    Class: Acute  . Hypertension, benign 05/19/2011    Class: Chronic  . DM II (diabetes mellitus, type II), controlled ([place]) 05/19/2011    Class: Chronic  . Gout, chronic 05/19/2011    Past Surgical History:  Procedure Laterality Date  . AV FISTULA PLACEMENT  03/29/2011   Procedure: ARTERIOVENOUS (AV) FISTULA CREATION;  Surgeon: Hinda Lenis, MD;  Location: [place];  Service: Vascular;  Laterality: Left;  LEFT RADIOCEPHALIC , Arteriovenous [id number])  . CARDIAC CATHETERIZATION  05/21/11  . CHOLECYSTECTOMY N/A 02/12/2016   Procedure: LAPAROSCOPIC CHOLECYSTECTOMY WITH INTRAOPERATIVE CHOLANGIOGRAM;  Surgeon: Jackolyn Confer, MD;  Location: [place];  Service: General;  Laterality: N/A;  . dialysis  cath placed    . ESOPHAGOGASTRODUODENOSCOPY (EGD) WITH PROPOFOL N/A 12/02/2015   Procedure: ESOPHAGOGASTRODUODENOSCOPY (EGD) WITH PROPOFOL;  Surgeon: Juanita Craver, MD;  Location: [facility];  Service: Endoscopy;  Laterality: N/A;  . FINGER SURGERY     L pinkie finger- ORIF- /w remaining hardware - 1990's    . HERNIA REPAIR N/A 08/30/8117   Umbilical hernia repair  . INSERTION OF DIALYSIS CATHETER N/A 01/27/2016   Procedure: INSERTION  OF DIALYSIS CATHETER;  Surgeon: Waynetta Sandy, MD;  Location: [place];  Service: Vascular;  Laterality: N/A;  . INSERTION OF MESH N/A 07/04/2012   Procedure: INSERTION OF MESH;  Surgeon: Madilyn Hook, DO;  Location: [place];  Service: General;  Laterality: N/A;  . LAPAROSCOPIC LYSIS OF ADHESIONS N/A 02/12/2016   Procedure: LAPAROSCOPIC LYSIS OF ADHESIONS;  Surgeon: Jackolyn Confer, MD;  Location: [place];  Service: General;  Laterality: N/A;  . LEFT AND RIGHT HEART CATHETERIZATION WITH CORONARY ANGIOGRAM N/A 08/04/2012   Procedure: LEFT AND RIGHT HEART CATHETERIZATION WITH CORONARY ANGIOGRAM;  Surgeon: Birdie Riddle, MD;  Location: [place] CATH LAB;  Service: Cardiovascular;  Laterality: N/A;  . NEPHRECTOMY  2000   right  . REVISON OF ARTERIOVENOUS FISTULA Left 06/05/2013   Procedure: REVISON OF LEFT RADIOCEPHALIC  ARTERIOVENOUS FISTULA;  Surgeon: Conrad Boulder, MD;  Location: [place];  Service: Vascular;  Laterality: Left;  . REVISON OF ARTERIOVENOUS FISTULA Left 01/27/2016   Procedure: REVISION OF LEFT UPPER EXTREMITY ARTERIOVENOUS FISTULA;  Surgeon: Waynetta Sandy, MD;  Location: [place];  Service: Vascular;  Laterality: Left;  . RIGHT HEART CATHETERIZATION N/A 05/21/2011   Procedure: RIGHT HEART CATH;  Surgeon: Birdie Riddle, MD;  Location: [facility] CATH LAB;  Service: Cardiovascular;  Laterality: N/A;  . smashed  1990's   "left pinky; have a plate in there"  . UMBILICAL HERNIA REPAIR N/A 07/04/2012   Procedure: LAPAROSCOPIC UMBILICAL HERNIA;  Surgeon: Madilyn Hook, DO;  Location: [place];  Service: General;  Laterality: N/A;  . US ECHOCARDIOGRAPHY  05/20/11       Home Medications    Prior to Admission medications   Medication Sig Start Date End Date Taking? Authorizing Provider  allopurinol (ZYLOPRIM) 300 MG tablet Take 150 mg by mouth daily. 12/07/15   Historical Provider, MD  amiodarone (PACERONE) 200 MG tablet Take 0.5 tablets (100 mg total) by mouth daily. 07/05/15   Dixie Dials, MD    amLODipine (NORVASC) 5 MG tablet Take 5 mg by mouth daily.     Historical Provider, MD  carvedilol (COREG) 25 MG tablet Take 0.5 tablets (12.5 mg total) by mouth See admin instructions. Take 1/2 tablet (12.5 mg) by mouth in the evening on dialysis (Monday, Wednesday, Friday and 1/2 tablet (12.5 mg) twice daily on Tuesday, Thursday, Saturday, Sunday (non-dialysis days) 07/05/15   Dixie Dials, MD  Cholecalciferol (VITAMIN D PO) Take 1 tablet by mouth daily.    Historical Provider, MD  cinacalcet (SENSIPAR) 90 MG tablet Take 90 mg by mouth daily.     Historical Provider, MD  isosorbide dinitrate (ISORDIL) 20 MG tablet Take 20 mg by mouth daily.     Historical Provider, MD  lidocaine-prilocaine (EMLA) cream Apply 1 application topically every Monday, Wednesday, and Friday. Apply to port access prior to dialysis 04/08/15   Historical Provider, MD  nitroGLYCERIN (NITROSTAT) 0.4 MG SL tablet Place 0.4 mg under the tongue every 5 (five) minutes as needed for chest pain.  05/20/15   Historical Provider, MD  oxyCODONE-acetaminophen (PERCOCET/ROXICET)  5-325 MG tablet Take 1-2 tablets by mouth every 4 (four) hours as needed for moderate pain. 02/13/16   Jackolyn Confer, MD  pantoprazole (PROTONIX) 40 MG tablet Take 1 tablet (40 mg total) by mouth 2 (two) times daily. 01/27/16   Alvia Grove, PA-C  sevelamer carbonate (RENVELA) 800 MG tablet Take 3 tablets (2,400 mg total) by mouth 3 (three) times daily with meals. 07/05/15   Dixie Dials, MD  warfarin (COUMADIN) 6 MG tablet Take 9 mg by mouth daily. 02/22/16   Historical Provider, MD    Family History Family History  Problem Relation Age of Onset  . Hypertension Mother   . Kidney disease Mother     Social History Social History  Substance Use Topics  . Smoking status: Never Smoker  . Smokeless tobacco: Never Used  . Alcohol use No     Allergies   Ace inhibitors   Review of Systems Review of Systems  Constitutional: Positive for fatigue.  Negative for fever.  Respiratory: Positive for cough and shortness of breath.   Cardiovascular: Positive for chest pain and palpitations.  All other systems reviewed and are negative.    Physical Exam Triage Vital Signs ED Triage Vitals [03/14/16 1717]  Enc Vitals Group     BP 136/79     Pulse Rate 76     Resp 24     Temp 98.4 F (36.9 C)     Temp Source Oral     SpO2 100 %     Weight      Height      Head Circumference      Peak Flow      Pain Score 10     Pain Loc      Pain Edu?      Excl. in [place]?    No data found.   Updated Vital Signs BP 136/79 (BP Location: Right Arm)   Pulse 76   Temp 98.4 F (36.9 C) (Oral)   Resp 24   SpO2 100%   Visual Acuity Right Eye Distance:   Left Eye Distance:   Bilateral Distance:    Right Eye Near:   Left Eye Near:    Bilateral Near:     Physical Exam  Constitutional: He appears distressed.  Neck: Normal range of motion. Neck supple.  Pulmonary/Chest: He has decreased breath sounds. He has rhonchi. He exhibits tenderness.  Abdominal: Soft. Bowel sounds are normal.  Lymphadenopathy:    He has no cervical adenopathy.  Skin: Skin is warm and dry.  Nursing note and vitals reviewed.    UC Treatments / Results  Labs (all labs ordered are listed, but only abnormal results are displayed) Labs Reviewed - No data to display  EKG  EKG Interpretation None       Radiology Dg Chest 2 View  Result Date: 03/14/2016 CLINICAL DATA:  Shortness of breath EXAM: CHEST  2 VIEW COMPARISON:  None. FINDINGS: Slight elevation of the left diaphragm. There is mild to moderate enlargement of the cardiomediastinal silhouette. There is mild central vascular congestion. No pleural effusion or focal consolidation. Mild atherosclerosis of the aorta. No pneumothorax. IMPRESSION: Stable cardiomegaly with mild central vascular congestion. No overt edema, consolidation or effusion. Atherosclerotic vascular disease of the aorta. Electronically  Signed   By: Donavan Foil M.D.   On: 03/14/2016 23:41   Dg Chest 2 View  Result Date: 03/14/2016 CLINICAL DATA:  Congestion for 5 days with productive cough, vomiting, nausea, right scapular pain, and  right rib pain. EXAM: CHEST  2 VIEW COMPARISON:  09/07/2015 FINDINGS: Enlargement of the cardiac silhouette is unchanged. There is mild prominence of the central pulmonary arteries with decreased pulmonary vascular congestion compared to the prior study. There is no evidence confluent airspace opacity, edema, pleural effusion, or pneumothorax. No acute osseous abnormality is identified. IMPRESSION: Cardiomegaly with decreased pulmonary vascular congestion. No evidence of edema or pneumonia. Electronically Signed   By: Logan Bores M.D.   On: 03/14/2016 18:53   Ct Angio Chest Pe W And/or Wo Contrast  Result Date: 03/15/2016 CLINICAL DATA:  55 year old male with back pain and pleuritic chest pain. EXAM: CT ANGIOGRAPHY CHEST CT ABDOMEN AND PELVIS WITH CONTRAST TECHNIQUE: Multidetector CT imaging of the chest was performed using the standard protocol during bolus administration of intravenous contrast. Multiplanar CT image reconstructions and MIPs were obtained to evaluate the vascular anatomy. Multidetector CT imaging of the abdomen and pelvis was performed using the standard protocol during bolus administration of intravenous contrast. CONTRAST:  80 cc Isovue 370 COMPARISON:  CT of the abdomen pelvis dated 09/07/2015 FINDINGS: CTA CHEST FINDINGS Cardiovascular: The thoracic aorta appears unremarkable. There is no aneurysmal dilatation or evidence of dissection. The origins of the great vessels of the aortic arch appear patent. There is non opacification of the left subclavian artery, likely related to timing of the contrast. There is mild dilatation of the main pulmonary trunk which may be represent a degree of pulmonary hypertension. Evaluation of the pulmonary arteries is limited due to respiratory motion  artifact as well as streak artifact caused by patient's arms. No definite large central pulmonary artery embolus identified. There is moderate cardiomegaly. Multi vessel coronary vascular disease. No pericardial effusion. Mediastinum/Nodes: There is no hilar or mediastinal adenopathy. The esophagus and the thyroid gland are grossly unremarkable. Lungs/Pleura: The lungs are clear. There is no pleural effusion or pneumothorax. The central airways are patent. Musculoskeletal: Degenerative changes of the spine. No acute fracture. Review of the MIP images confirms the above findings. CT ABDOMEN and PELVIS FINDINGS No intra-abdominal free air.  Small subhepatic free fluid. Hepatobiliary: A 9 mm left hepatic hypodense lesion is not well characterized but appears similar to prior CT and may represent a cyst or hemangioma. The liver is otherwise unremarkable. Mild intrahepatic biliary ductal dilatation. There is postsurgical changes of cholecystectomy. There is a small amount of fluid as well as inflammatory changes of the cholecystectomy bed. Although these may be postsurgical, findings are concerning for an infectious process or bile leak 1 month postoperative. Correlation with clinical exam recommended. A hepatobiliary scintigraphy may provide better evaluation and better assessment for possibility of bile leak. No discrete drainable fluid collection identified at this time. No retained CBD stone noted. Pancreas: Unremarkable. No pancreatic ductal dilatation or surrounding inflammatory changes. Spleen: Normal in size without focal abnormality. Adrenals/Urinary Tract: The adrenal glands appear unremarkable. Status post prior right nephrectomy. There is marked left renal atrophy. Innumerable left renal hypodense lesions are not well characterized on this CT but may represent cysts. MRI or ultrasound may provide better characterization. This findings is similar to prior CT. There is no hydronephrosis on the left. No stone  identified. The visualized ureter appears unremarkable. The urinary bladder is collapsed. Stomach/Bowel: There is sigmoid diverticulosis without active inflammatory changes. Scattered colonic diverticula in the proximal transverse as well as in the ascending colon noted. Mild haziness of the diverticula at the hepatic flexure, likely secondary to extension of inflammatory changes of the cholecystectomy. Diverticulitis is less  likely. There is no evidence of bowel obstruction. Normal appendix. Vascular/Lymphatic: Mild aortoiliac atherosclerotic disease. There is no aneurysmal dilatation or evidence of dissection. The origins of the celiac axis, SMA, IMA appear patent. The IVC appears patent. No portal venous gas identified. The SMV, splenic vein, and main portal vein are patent. There is no adenopathy. Reproductive: The prostate and seminal vesicles are grossly unremarkable. Other: Small fat containing umbilical hernia. Right anterior abdominal wall hernia repair mesh. Musculoskeletal: Degenerative changes of the spine. No acute fracture. Review of the MIP images confirms the above findings. IMPRESSION: No CT evidence of aortic dissection or central pulmonary artery embolus. Inflammatory changes of the cholecystectomy with small amount of fluid in the subhepatic area concerning for an infectious process or bile leak. Correlation with clinical exam recommended. A hepatobiliary scintigraphy may provide better evaluation and assistant for possible chronic. No drainable fluid collection. Colonic diverticulosis without active inflammatory changes. No evidence of bowel obstruction. Normal appendix. Electronically Signed   By: Anner Crete M.D.   On: 03/15/2016 03:35   Nm Hepatobiliary Liver Func  Result Date: 03/15/2016 CLINICAL DATA:  Cholecystectomy 02/12/2016. RIGHT-sided abdominal pain and back pain. EXAM: NUCLEAR MEDICINE HEPATOBILIARY IMAGING TECHNIQUE: Sequential images of the abdomen were obtained out to  60 minutes following intravenous administration of radiopharmaceutical. RADIOPHARMACEUTICALS:  5.0 mCi Tc-63m  Choletec IV COMPARISON:  CT 03/15/2016 FINDINGS: Uniform uptake of radiotracer within the liver. Counts are evident within the common bile duct and small bowel by 30 minutes. There is no evidence of extra biliary radiotracer to suggest bile leak. IMPRESSION: 1. No evidence of bile leak. 2. Patent common bile duct. Electronically Signed   By: Suzy Bouchard M.D.   On: 03/15/2016 11:15   Ct Abdomen Pelvis W Contrast  Result Date: 03/15/2016 CLINICAL DATA:  55 year old male with back pain and pleuritic chest pain. EXAM: CT ANGIOGRAPHY CHEST CT ABDOMEN AND PELVIS WITH CONTRAST TECHNIQUE: Multidetector CT imaging of the chest was performed using the standard protocol during bolus administration of intravenous contrast. Multiplanar CT image reconstructions and MIPs were obtained to evaluate the vascular anatomy. Multidetector CT imaging of the abdomen and pelvis was performed using the standard protocol during bolus administration of intravenous contrast. CONTRAST:  80 cc Isovue 370 COMPARISON:  CT of the abdomen pelvis dated 09/07/2015 FINDINGS: CTA CHEST FINDINGS Cardiovascular: The thoracic aorta appears unremarkable. There is no aneurysmal dilatation or evidence of dissection. The origins of the great vessels of the aortic arch appear patent. There is non opacification of the left subclavian artery, likely related to timing of the contrast. There is mild dilatation of the main pulmonary trunk which may be represent a degree of pulmonary hypertension. Evaluation of the pulmonary arteries is limited due to respiratory motion artifact as well as streak artifact caused by patient's arms. No definite large central pulmonary artery embolus identified. There is moderate cardiomegaly. Multi vessel coronary vascular disease. No pericardial effusion. Mediastinum/Nodes: There is no hilar or mediastinal  adenopathy. The esophagus and the thyroid gland are grossly unremarkable. Lungs/Pleura: The lungs are clear. There is no pleural effusion or pneumothorax. The central airways are patent. Musculoskeletal: Degenerative changes of the spine. No acute fracture. Review of the MIP images confirms the above findings. CT ABDOMEN and PELVIS FINDINGS No intra-abdominal free air.  Small subhepatic free fluid. Hepatobiliary: A 9 mm left hepatic hypodense lesion is not well characterized but appears similar to prior CT and may represent a cyst or hemangioma. The liver is otherwise unremarkable. Mild intrahepatic  biliary ductal dilatation. There is postsurgical changes of cholecystectomy. There is a small amount of fluid as well as inflammatory changes of the cholecystectomy bed. Although these may be postsurgical, findings are concerning for an infectious process or bile leak 1 month postoperative. Correlation with clinical exam recommended. A hepatobiliary scintigraphy may provide better evaluation and better assessment for possibility of bile leak. No discrete drainable fluid collection identified at this time. No retained CBD stone noted. Pancreas: Unremarkable. No pancreatic ductal dilatation or surrounding inflammatory changes. Spleen: Normal in size without focal abnormality. Adrenals/Urinary Tract: The adrenal glands appear unremarkable. Status post prior right nephrectomy. There is marked left renal atrophy. Innumerable left renal hypodense lesions are not well characterized on this CT but may represent cysts. MRI or ultrasound may provide better characterization. This findings is similar to prior CT. There is no hydronephrosis on the left. No stone identified. The visualized ureter appears unremarkable. The urinary bladder is collapsed. Stomach/Bowel: There is sigmoid diverticulosis without active inflammatory changes. Scattered colonic diverticula in the proximal transverse as well as in the ascending colon noted. Mild  haziness of the diverticula at the hepatic flexure, likely secondary to extension of inflammatory changes of the cholecystectomy. Diverticulitis is less likely. There is no evidence of bowel obstruction. Normal appendix. Vascular/Lymphatic: Mild aortoiliac atherosclerotic disease. There is no aneurysmal dilatation or evidence of dissection. The origins of the celiac axis, SMA, IMA appear patent. The IVC appears patent. No portal venous gas identified. The SMV, splenic vein, and main portal vein are patent. There is no adenopathy. Reproductive: The prostate and seminal vesicles are grossly unremarkable. Other: Small fat containing umbilical hernia. Right anterior abdominal wall hernia repair mesh. Musculoskeletal: Degenerative changes of the spine. No acute fracture. Review of the MIP images confirms the above findings. IMPRESSION: No CT evidence of aortic dissection or central pulmonary artery embolus. Inflammatory changes of the cholecystectomy with small amount of fluid in the subhepatic area concerning for an infectious process or bile leak. Correlation with clinical exam recommended. A hepatobiliary scintigraphy may provide better evaluation and assistant for possible chronic. No drainable fluid collection. Colonic diverticulosis without active inflammatory changes. No evidence of bowel obstruction. Normal appendix. Electronically Signed   By: Anner Crete M.D.   On: 03/15/2016 03:35    Procedures Procedures (including critical care time)  Medications Ordered in UC Medications - No data to display   Initial Impression / Assessment and Plan / UC Course  I have reviewed the triage vital signs and the nursing notes.  Pertinent labs & imaging results that were available during my care of the patient were reviewed by me and considered in my medical decision making (see chart for details).  Clinical Course    Pt with numerous med problems with no etiol for cough for 2 mo.  Final Clinical  Impressions(s) / UC Diagnoses   Final diagnoses:  Cough  Back pain of thoracolumbar region    New Prescriptions Discharge Medication List as of 03/14/2016  7:39 PM       Billy Fischer, MD 03/14/16 1902    Billy Fischer, MD 03/15/16 1600

## 2016-03-14 NOTE — ED Notes (Addendum)
Per IV team, unsuccessful attempts without and with ultrasound.  MD Zavitz notified.

## 2016-03-14 NOTE — ED Triage Notes (Signed)
Pain is worse with movement, deep breathing, and coughing.

## 2016-03-15 ENCOUNTER — Observation Stay (HOSPITAL_COMMUNITY): Payer: Medicare Other

## 2016-03-15 ENCOUNTER — Emergency Department (HOSPITAL_COMMUNITY): Payer: Medicare Other

## 2016-03-15 DIAGNOSIS — I420 Dilated cardiomyopathy: Secondary | ICD-10-CM | POA: Diagnosis not present

## 2016-03-15 DIAGNOSIS — K9189 Other postprocedural complications and disorders of digestive system: Secondary | ICD-10-CM

## 2016-03-15 DIAGNOSIS — I5022 Chronic systolic (congestive) heart failure: Secondary | ICD-10-CM | POA: Diagnosis not present

## 2016-03-15 DIAGNOSIS — R7989 Other specified abnormal findings of blood chemistry: Secondary | ICD-10-CM

## 2016-03-15 DIAGNOSIS — K219 Gastro-esophageal reflux disease without esophagitis: Secondary | ICD-10-CM | POA: Diagnosis present

## 2016-03-15 DIAGNOSIS — E1122 Type 2 diabetes mellitus with diabetic chronic kidney disease: Secondary | ICD-10-CM | POA: Diagnosis not present

## 2016-03-15 DIAGNOSIS — I4892 Unspecified atrial flutter: Secondary | ICD-10-CM | POA: Diagnosis not present

## 2016-03-15 DIAGNOSIS — R0781 Pleurodynia: Secondary | ICD-10-CM | POA: Diagnosis not present

## 2016-03-15 DIAGNOSIS — Z905 Acquired absence of kidney: Secondary | ICD-10-CM | POA: Diagnosis not present

## 2016-03-15 DIAGNOSIS — Z85528 Personal history of other malignant neoplasm of kidney: Secondary | ICD-10-CM | POA: Diagnosis not present

## 2016-03-15 DIAGNOSIS — I2510 Atherosclerotic heart disease of native coronary artery without angina pectoris: Secondary | ICD-10-CM | POA: Diagnosis not present

## 2016-03-15 DIAGNOSIS — R109 Unspecified abdominal pain: Secondary | ICD-10-CM | POA: Diagnosis not present

## 2016-03-15 DIAGNOSIS — N2581 Secondary hyperparathyroidism of renal origin: Secondary | ICD-10-CM | POA: Diagnosis not present

## 2016-03-15 DIAGNOSIS — N186 End stage renal disease: Secondary | ICD-10-CM | POA: Diagnosis not present

## 2016-03-15 DIAGNOSIS — R071 Chest pain on breathing: Secondary | ICD-10-CM | POA: Diagnosis not present

## 2016-03-15 DIAGNOSIS — E119 Type 2 diabetes mellitus without complications: Secondary | ICD-10-CM | POA: Diagnosis not present

## 2016-03-15 DIAGNOSIS — D649 Anemia, unspecified: Secondary | ICD-10-CM | POA: Diagnosis not present

## 2016-03-15 DIAGNOSIS — I10 Essential (primary) hypertension: Secondary | ICD-10-CM | POA: Diagnosis not present

## 2016-03-15 DIAGNOSIS — Q600 Renal agenesis, unilateral: Secondary | ICD-10-CM | POA: Diagnosis not present

## 2016-03-15 DIAGNOSIS — E871 Hypo-osmolality and hyponatremia: Secondary | ICD-10-CM | POA: Diagnosis present

## 2016-03-15 DIAGNOSIS — I252 Old myocardial infarction: Secondary | ICD-10-CM | POA: Diagnosis not present

## 2016-03-15 DIAGNOSIS — D631 Anemia in chronic kidney disease: Secondary | ICD-10-CM | POA: Diagnosis not present

## 2016-03-15 DIAGNOSIS — E8770 Fluid overload, unspecified: Secondary | ICD-10-CM | POA: Diagnosis not present

## 2016-03-15 DIAGNOSIS — K838 Other specified diseases of biliary tract: Secondary | ICD-10-CM

## 2016-03-15 DIAGNOSIS — K5730 Diverticulosis of large intestine without perforation or abscess without bleeding: Secondary | ICD-10-CM | POA: Diagnosis not present

## 2016-03-15 DIAGNOSIS — M109 Gout, unspecified: Secondary | ICD-10-CM | POA: Diagnosis present

## 2016-03-15 DIAGNOSIS — R778 Other specified abnormalities of plasma proteins: Secondary | ICD-10-CM | POA: Diagnosis present

## 2016-03-15 DIAGNOSIS — I120 Hypertensive chronic kidney disease with stage 5 chronic kidney disease or end stage renal disease: Secondary | ICD-10-CM | POA: Diagnosis not present

## 2016-03-15 DIAGNOSIS — R0789 Other chest pain: Secondary | ICD-10-CM

## 2016-03-15 DIAGNOSIS — R1011 Right upper quadrant pain: Secondary | ICD-10-CM | POA: Diagnosis not present

## 2016-03-15 DIAGNOSIS — E8889 Other specified metabolic disorders: Secondary | ICD-10-CM | POA: Diagnosis present

## 2016-03-15 DIAGNOSIS — Z9861 Coronary angioplasty status: Secondary | ICD-10-CM | POA: Diagnosis not present

## 2016-03-15 DIAGNOSIS — Z9049 Acquired absence of other specified parts of digestive tract: Secondary | ICD-10-CM | POA: Diagnosis not present

## 2016-03-15 DIAGNOSIS — I132 Hypertensive heart and chronic kidney disease with heart failure and with stage 5 chronic kidney disease, or end stage renal disease: Secondary | ICD-10-CM | POA: Diagnosis present

## 2016-03-15 DIAGNOSIS — R072 Precordial pain: Secondary | ICD-10-CM | POA: Diagnosis not present

## 2016-03-15 DIAGNOSIS — K839 Disease of biliary tract, unspecified: Secondary | ICD-10-CM | POA: Diagnosis present

## 2016-03-15 DIAGNOSIS — Z7901 Long term (current) use of anticoagulants: Secondary | ICD-10-CM | POA: Diagnosis not present

## 2016-03-15 DIAGNOSIS — Z992 Dependence on renal dialysis: Secondary | ICD-10-CM | POA: Diagnosis not present

## 2016-03-15 DIAGNOSIS — H5461 Unqualified visual loss, right eye, normal vision left eye: Secondary | ICD-10-CM | POA: Diagnosis present

## 2016-03-15 DIAGNOSIS — R748 Abnormal levels of other serum enzymes: Secondary | ICD-10-CM | POA: Diagnosis not present

## 2016-03-15 DIAGNOSIS — R1084 Generalized abdominal pain: Secondary | ICD-10-CM | POA: Diagnosis not present

## 2016-03-15 DIAGNOSIS — Z8673 Personal history of transient ischemic attack (TIA), and cerebral infarction without residual deficits: Secondary | ICD-10-CM | POA: Diagnosis not present

## 2016-03-15 HISTORY — DX: Other specified diseases of biliary tract: K83.8

## 2016-03-15 HISTORY — DX: Other postprocedural complications and disorders of digestive system: K91.89

## 2016-03-15 LAB — RENAL FUNCTION PANEL
Albumin: 3.1 g/dL — ABNORMAL LOW (ref 3.5–5.0)
Anion gap: 10 (ref 5–15)
BUN: 60 mg/dL — ABNORMAL HIGH (ref 6–20)
CO2: 24 mmol/L (ref 22–32)
Calcium: 7.8 mg/dL — ABNORMAL LOW (ref 8.9–10.3)
Chloride: 99 mmol/L — ABNORMAL LOW (ref 101–111)
Creatinine, Ser: 15.23 mg/dL — ABNORMAL HIGH (ref 0.61–1.24)
GFR calc Af Amer: 4 mL/min — ABNORMAL LOW (ref >60)
GFR calc non Af Amer: 3 mL/min — ABNORMAL LOW (ref >60)
Glucose, Bld: 102 mg/dL — ABNORMAL HIGH (ref 65–99)
Phosphorus: 4.1 mg/dL (ref 2.5–4.6)
Potassium: 6.1 mmol/L — ABNORMAL HIGH (ref 3.5–5.1)
Sodium: 133 mmol/L — ABNORMAL LOW (ref 135–145)

## 2016-03-15 LAB — TROPONIN I
Troponin I: 0.21 ng/mL (ref <0.03)
Troponin I: 0.22 ng/mL (ref <0.03)
Troponin I: 0.24 ng/mL (ref <0.03)
Troponin I: 0.21 ng/mL (ref <0.03)

## 2016-03-15 LAB — HEPARIN LEVEL (UNFRACTIONATED)
Heparin Unfractionated: 0.8 IU/mL — ABNORMAL HIGH (ref 0.30–0.70)
Heparin Unfractionated: 0.82 IU/mL — ABNORMAL HIGH (ref 0.30–0.70)

## 2016-03-15 LAB — CBC
HCT: 33.4 % — ABNORMAL LOW (ref 39.0–52.0)
Hemoglobin: 11 g/dL — ABNORMAL LOW (ref 13.0–17.0)
MCH: 31.4 pg (ref 26.0–34.0)
MCHC: 32.9 g/dL (ref 30.0–36.0)
MCV: 95.4 fL (ref 78.0–100.0)
Platelets: 140 10*3/uL — ABNORMAL LOW (ref 150–400)
RBC: 3.5 MIL/uL — ABNORMAL LOW (ref 4.22–5.81)
RDW: 15.1 % (ref 11.5–15.5)
WBC: 3.9 10*3/uL — ABNORMAL LOW (ref 4.0–10.5)

## 2016-03-15 MED ORDER — HYDROMORPHONE HCL 2 MG/ML IJ SOLN
1.0000 mg | Freq: Once | INTRAMUSCULAR | Status: AC
  Administered 2016-03-15: 1 mg via INTRAVENOUS
  Filled 2016-03-15: qty 1

## 2016-03-15 MED ORDER — HEPARIN SODIUM (PORCINE) 1000 UNIT/ML DIALYSIS
40.0000 [IU]/kg | INTRAMUSCULAR | Status: DC | PRN
  Filled 2016-03-15: qty 5

## 2016-03-15 MED ORDER — IOPAMIDOL (ISOVUE-370) INJECTION 76%
INTRAVENOUS | Status: AC
  Administered 2016-03-15: 80 mL
  Filled 2016-03-15: qty 100

## 2016-03-15 MED ORDER — CARVEDILOL 12.5 MG PO TABS
12.5000 mg | ORAL_TABLET | ORAL | Status: DC
  Administered 2016-03-16 – 2016-03-20 (×5): 12.5 mg via ORAL
  Filled 2016-03-15 (×6): qty 1

## 2016-03-15 MED ORDER — AMIODARONE HCL 100 MG PO TABS
100.0000 mg | ORAL_TABLET | Freq: Every day | ORAL | Status: DC
  Administered 2016-03-15 – 2016-03-21 (×7): 100 mg via ORAL
  Filled 2016-03-15 (×7): qty 1

## 2016-03-15 MED ORDER — CARVEDILOL 12.5 MG PO TABS
12.5000 mg | ORAL_TABLET | ORAL | Status: DC
  Administered 2016-03-15 – 2016-03-19 (×3): 12.5 mg via ORAL
  Filled 2016-03-15 (×4): qty 1

## 2016-03-15 MED ORDER — HEPARIN SODIUM (PORCINE) 1000 UNIT/ML DIALYSIS
1000.0000 [IU] | INTRAMUSCULAR | Status: DC | PRN
  Filled 2016-03-15: qty 1

## 2016-03-15 MED ORDER — ALTEPLASE 2 MG IJ SOLR
2.0000 mg | Freq: Once | INTRAMUSCULAR | Status: DC | PRN
Start: 2016-03-15 — End: 2016-03-15

## 2016-03-15 MED ORDER — ASPIRIN 81 MG PO CHEW
81.0000 mg | CHEWABLE_TABLET | ORAL | Status: AC
  Administered 2016-03-16: 81 mg via ORAL
  Filled 2016-03-15: qty 1

## 2016-03-15 MED ORDER — SEVELAMER CARBONATE 800 MG PO TABS
2400.0000 mg | ORAL_TABLET | Freq: Three times a day (TID) | ORAL | Status: DC
Start: 2016-03-15 — End: 2016-03-21
  Administered 2016-03-15 – 2016-03-21 (×14): 2400 mg via ORAL
  Filled 2016-03-15 (×14): qty 3

## 2016-03-15 MED ORDER — NITROGLYCERIN 0.4 MG SL SUBL: 0.4000 mg | SUBLINGUAL_TABLET | SUBLINGUAL | Status: DC | PRN

## 2016-03-15 MED ORDER — LIDOCAINE-PRILOCAINE 2.5-2.5 % EX CREA
1.0000 "application " | TOPICAL_CREAM | CUTANEOUS | Status: DC | PRN
  Filled 2016-03-15: qty 5

## 2016-03-15 MED ORDER — HEPARIN BOLUS VIA INFUSION
4000.0000 [IU] | Freq: Once | INTRAVENOUS | Status: AC
  Administered 2016-03-15: 4000 [IU] via INTRAVENOUS
  Filled 2016-03-15: qty 4000

## 2016-03-15 MED ORDER — HEPARIN SODIUM (PORCINE) 5000 UNIT/ML IJ SOLN: 5000.0000 [IU] | Freq: Three times a day (TID) | INTRAMUSCULAR | Status: DC

## 2016-03-15 MED ORDER — ALLOPURINOL 300 MG PO TABS
150.0000 mg | ORAL_TABLET | Freq: Every day | ORAL | Status: DC
  Administered 2016-03-15 – 2016-03-21 (×7): 150 mg via ORAL
  Filled 2016-03-15 (×7): qty 1

## 2016-03-15 MED ORDER — HEPARIN (PORCINE) IN NACL 100-0.45 UNIT/ML-% IJ SOLN
1200.0000 [IU]/h | INTRAMUSCULAR | Status: DC
  Administered 2016-03-15: 1400 [IU]/h via INTRAVENOUS
  Administered 2016-03-15: 1500 [IU]/h via INTRAVENOUS
  Filled 2016-03-15: qty 250

## 2016-03-15 MED ORDER — OXYCODONE-ACETAMINOPHEN 5-325 MG PO TABS
1.0000 | ORAL_TABLET | ORAL | Status: DC | PRN
  Administered 2016-03-15 – 2016-03-20 (×18): 2 via ORAL
  Filled 2016-03-15 (×17): qty 2

## 2016-03-15 MED ORDER — AMLODIPINE BESYLATE 5 MG PO TABS
5.0000 mg | ORAL_TABLET | Freq: Every day | ORAL | Status: DC
  Administered 2016-03-15 – 2016-03-17 (×3): 5 mg via ORAL
  Filled 2016-03-15 (×3): qty 1

## 2016-03-15 MED ORDER — OXYCODONE-ACETAMINOPHEN 5-325 MG PO TABS
ORAL_TABLET | ORAL | Status: AC
  Administered 2016-03-15: 2 via ORAL
  Filled 2016-03-15: qty 2

## 2016-03-15 MED ORDER — ISOSORBIDE DINITRATE 10 MG PO TABS
20.0000 mg | ORAL_TABLET | Freq: Every day | ORAL | Status: DC
  Administered 2016-03-15 – 2016-03-21 (×7): 20 mg via ORAL
  Filled 2016-03-15 (×6): qty 2
  Filled 2016-03-15 (×2): qty 1

## 2016-03-15 MED ORDER — SODIUM CHLORIDE 0.9 % IV SOLN: 100.0000 mL | INTRAVENOUS | Status: DC | PRN

## 2016-03-15 MED ORDER — SODIUM CHLORIDE 0.9% FLUSH
3.0000 mL | Freq: Two times a day (BID) | INTRAVENOUS | Status: DC
  Administered 2016-03-17: 3 mL via INTRAVENOUS

## 2016-03-15 MED ORDER — LIDOCAINE HCL (PF) 1 % IJ SOLN
5.0000 mL | INTRAMUSCULAR | Status: DC | PRN
  Filled 2016-03-15: qty 5

## 2016-03-15 MED ORDER — CINACALCET HCL 30 MG PO TABS
90.0000 mg | ORAL_TABLET | Freq: Every day | ORAL | Status: DC
  Administered 2016-03-18 – 2016-03-21 (×4): 90 mg via ORAL
  Filled 2016-03-15 (×6): qty 3

## 2016-03-15 MED ORDER — CARVEDILOL 12.5 MG PO TABS: 12.5000 mg | ORAL_TABLET | ORAL | Status: DC

## 2016-03-15 MED ORDER — PANTOPRAZOLE SODIUM 40 MG PO TBEC
40.0000 mg | DELAYED_RELEASE_TABLET | Freq: Two times a day (BID) | ORAL | Status: DC
Start: 2016-03-15 — End: 2016-03-21
  Administered 2016-03-15 – 2016-03-21 (×11): 40 mg via ORAL
  Filled 2016-03-15 (×11): qty 1

## 2016-03-15 MED ORDER — PENTAFLUOROPROP-TETRAFLUOROETH EX AERO: 1.0000 "application " | INHALATION_SPRAY | CUTANEOUS | Status: DC | PRN

## 2016-03-15 NOTE — Progress Notes (Signed)
NURSING PROGRESS NOTE  [name] [id number] Admission Data: 03/15/2016 6:16 AM Attending Provider: Etta Quill, DO RXY:VOPFYT Elyse Hsu, MD Code Status: Full  [name] is a 55 y.o. male patient admitted from ED:  -No acute distress noted.  -No complaints of shortness of breath.  -No complaints of chest pain.   Cardiac Monitoring: Box # 13 in place. Cardiac monitor yields:normal sinus rhythm.  Blood pressure (!) 167/107, pulse 68, temperature 97.7 F (36.5 C), temperature source Oral, resp. rate 17, height 6\' 1"  (1.854 m), weight 119.7 kg (263 lb 14.4 oz), SpO2 100 %.   IV Fluids:  IV in place, occlusive dsg intact without redness, IV cath antecubital right, condition patent and no redness none.   Allergies:  Ace inhibitors  Past Medical History:   has a past medical history of Atrial flutter ([place]); Blind right eye; Cardiomyopathy; CHF (congestive heart failure) ([place]); Coronary artery disease; Diabetes mellitus; Dialysis patient [facility]); Diverticulitis (November 2016); ESRD (end stage renal disease) ([place]); GERD (gastroesophageal reflux disease); Gout; History of nephrectomy (07/04/2012); History of unilateral nephrectomy; Hypertension; Low iron; Myocardial infarction (?2006); Renal cell carcinoma; Renal insufficiency; Shortness of breath (05/19/11); Stroke [facility]) (24/4628); and Umbilical hernia (63/81/77).  Past Surgical History:   has a past surgical history that includes Nephrectomy (2000); AV fistula placement (03/29/2011); smashed (1990's); US ECHOCARDIOGRAPHY (05/20/11); Cardiac catheterization (05/21/11); Finger surgery; Umbilical hernia repair (N/A, 07/04/2012); Insertion of mesh (N/A, 07/04/2012); Hernia repair (N/A, 07/04/2012); dialysis cath placed; Revison of arteriovenous fistula (Left, 06/05/2013); right heart catheterization (N/A, 05/21/2011); left and right heart catheterization with coronary angiogram (N/A, 08/04/2012); Esophagogastroduodenoscopy (egd) with propofol (N/A,  12/02/2015); Revison of arteriovenous fistula (Left, 01/27/2016); Insertion of dialysis catheter (N/A, 01/27/2016); Cholecystectomy (N/A, 02/12/2016); and Laparoscopic lysis of adhesions (N/A, 02/12/2016).  Social History:   reports that he has never smoked. He has never used smokeless tobacco. He reports that he does not drink alcohol or use drugs.  Skin: intact, dry  Patient/Family orientated to room. Information packet given to patient/family. Admission inpatient armband information verified with patient/family to include name and date of birth and placed on patient arm. Side rails up x 2, fall assessment and education completed with patient/family. Patient/family able to verbalize understanding of risk associated with falls and verbalized understanding to call for assistance before getting out of bed. Call light within reach. Patient/family able to voice and demonstrate understanding of unit orientation instructions.

## 2016-03-15 NOTE — Progress Notes (Signed)
No evidence of bile leakage.  Pain probably not related to cholecystectomy.  Kathryne Eriksson. Dahlia Bailiff, MD, [place] [phone] [facility] Surgery

## 2016-03-15 NOTE — ED Notes (Signed)
Admitting MD at bedside.

## 2016-03-15 NOTE — Progress Notes (Signed)
Dialysis treatment completed.  4500 mL ultrafiltrated and net fluid removal 4000 mL.    Patient A & O X 4. Lung sounds clear to ausculation in all fields. Generalized edema. Cardiac: NSR.  Disconnected lines and removed needles.  Pressure held for 10 minutes and band aid/gauze dressing applied.  Report given to bedside RN, [place].

## 2016-03-15 NOTE — Progress Notes (Signed)
Patient arrived to unit per bed.  Reviewed treatment plan and this RN agrees.  Report received from bedside RN, Judson Roch.  Consent obtained.  Patient A & O X 4. Lung sounds clear to ausculation in all fields. Generalized edema. Cardiac: NSR.  Prepped LLAVF with alcohol and cannulated with two 15 gauge needles.  Pulsation of blood noted.  Flushed access well with saline per protocol.  Connected and secured lines and initiated tx at 1601.  UF goal of 4500 mL and net fluid removal of 4000 mL.  Will continue to monitor.

## 2016-03-15 NOTE — ED Notes (Signed)
Pt returned from CT °

## 2016-03-15 NOTE — Progress Notes (Signed)
ANTICOAGULATION CONSULT NOTE - Initial Consult  Pharmacy Consult for Heparin Indication: afib, r/o NSTEMI  Allergies  Allergen Reactions  . Ace Inhibitors Itching and Cough    Patient Measurements: Height: 6\' 1"  (185.4 cm) Weight: 263 lb 14.4 oz (119.7 kg) IBW/kg (Calculated) : 79.9 Heparin Dosing Weight: 106 kg  Vital Signs: Temp: 98.2 F (36.8 C) (10/22 2014) Temp Source: Oral (10/22 2014) BP: 155/86 (10/23 0430) Pulse Rate: 70 (10/23 0430)  Labs:  Recent Labs  03/14/16 2214 03/15/16 0119  HGB 11.3*  --   HCT 34.6*  --   PLT 144*  --   LABPROT 13.6  --   INR 1.04  --   CREATININE 13.40*  --   TROPONINI 0.22* 0.21*    Estimated Creatinine Clearance: 8.7 mL/min (by C-G formula based on SCr of 13.4 mg/dL (H)).   Medical History: Past Medical History:  Diagnosis Date  . Atrial flutter ([place])   . 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 Cardiomyopathy   . CHF (congestive heart failure) ([place])   . Coronary artery disease    normal coronaries by 10/10/08 cath, Cardiac Cath 08-04-12 epic.Dr. Doylene Canard follows  . Diabetes mellitus    pt. states he's borderline diabetic., no longer taking med,- off med. since 2013  . Dialysis patient [facility])    Mon-Wed-Fri([place])- Left AV fistula  . Diverticulitis November 2016   reoccurred in December 2016  . ESRD (end stage renal disease) ([place])   . GERD (gastroesophageal reflux disease)   . Gout   . History of nephrectomy 07/04/2012   History of right nephrectomy in 2002 for renal cell carcinoma   . History of unilateral nephrectomy   . Hypertension   . Low iron   . Myocardial infarction ?2006  . Renal cell carcinoma    dialysis- MWF- Dr. Mercy Moore follows.  . Renal insufficiency   . Shortness of breath 05/19/11   "at rest, lying down, w/exertion"  . Stroke [facility]) 02/2011   05/19/11 denies residual  . Umbilical hernia 97/67/34   unrepaired    Medications:  See electronic med  rec  Assessment: 55 y.o. M presents with cough, CP, and abd pain. Pt reports taking coumadin PTA but admission INR 1.04. Home dose: 9mg  daily - last 10/22. Holding coumadin in case surgery or cath needed and beginning heparin for afib and r/o NSTEMI. CBC ok on admission.  Goal of Therapy:  Heparin level 0.3-0.7 units/ml; INR 2-3 Monitor platelets by anticoagulation protocol: Yes   Plan:  Heparin IV bolus 4000 units Heparin gtt at 1500 units/hr Will f/u heparin level in 8 hours Daily heparin level and CBC  Sherlon Handing, PharmD, BCPS Clinical pharmacist, pager [phone] 03/15/2016,5:00 AM

## 2016-03-15 NOTE — Progress Notes (Signed)
[place] for Heparin Indication: afib, r/o NSTEMI  Allergies  Allergen Reactions  . Ace Inhibitors Itching and Cough    Patient Measurements: Height: 6\' 1"  (185.4 cm) Weight: 263 lb 14.4 oz (119.7 kg) IBW/kg (Calculated) : 79.9 Heparin Dosing Weight: 106 kg   Vital Signs: Temp: 97.7 F (36.5 C) (10/23 0521) Temp Source: Oral (10/23 0521) BP: 167/107 (10/23 0521) Pulse Rate: 68 (10/23 0521)  Labs:  Recent Labs  03/14/16 2214 03/15/16 0119 03/15/16 0437 03/15/16 1035 03/15/16 1321  HGB 11.3*  --   --   --   --   HCT 34.6*  --   --   --   --   PLT 144*  --   --   --   --   LABPROT 13.6  --   --   --   --   INR 1.04  --   --   --   --   HEPARINUNFRC  --   --   --   --  0.80*  CREATININE 13.40*  --   --   --   --   TROPONINI 0.22* 0.21* 0.22* 0.24*  --     Estimated Creatinine Clearance: 8.7 mL/min (by C-G formula based on SCr of 13.4 mg/dL (H)).   Medical History: Past Medical History:  Diagnosis Date  . Atrial flutter ([place])   . 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 Cardiomyopathy   . CHF (congestive heart failure) ([place])   . Coronary artery disease    normal coronaries by 10/10/08 cath, Cardiac Cath 08-04-12 epic.Dr. Doylene Canard follows  . Diabetes mellitus    pt. states he's borderline diabetic., no longer taking med,- off med. since 2013  . Dialysis patient [facility])    Mon-Wed-Fri([place])- Left AV fistula  . Diverticulitis November 2016   reoccurred in December 2016  . ESRD (end stage renal disease) ([place])   . GERD (gastroesophageal reflux disease)   . Gout   . History of nephrectomy 07/04/2012   History of right nephrectomy in 2002 for renal cell carcinoma   . History of unilateral nephrectomy   . Hypertension   . Low iron   . Myocardial infarction ?2006  . Renal cell carcinoma    dialysis- MWF- Dr. Mercy Moore follows.  . Renal insufficiency   . Shortness of breath 05/19/11   "at  rest, lying down, w/exertion"  . Stroke [facility]) 02/2011   05/19/11 denies residual  . Umbilical hernia 71/21/97   unrepaired    Medications:  See electronic med rec  Assessment: 55 y.o. M on heparin infusion while warfarin on hold for NSTEMI work up. Initial heparin level is higher than desired at 0.80 (goal 0.3 - 0.7).   Goal of Therapy:  Heparin level 0.3-0.7 units/ml; INR 2-3 Monitor platelets by anticoagulation protocol: Yes   Plan:  - Decrease heparin infusion to 1400 units/hr - Heparin level in 8 hours - Await cardiology input   Vincenza Hews, PharmD, BCPS 03/15/2016, 2:37 PM Pager: [phone]

## 2016-03-15 NOTE — ED Notes (Signed)
Patient transported to CT 

## 2016-03-15 NOTE — ED Notes (Signed)
CT notified of IV access success.

## 2016-03-15 NOTE — Consult Note (Signed)
[facility] Renal Consultation Note    Indication for Consultation:  Management of ESRD/hemodialysis; anemia, hypertension/volume and secondary hyperparathyroidism PCP:  HPI: [name] is a 55 y.o. male with ESRD secondary to HTN with solitary kidney on hemodialysis since 05/2010. H [name] was recently admitted for  cholecystectomy on 02/12/16 following biliary dyskinesia that increased over the past 3 months. He was on coumadin for aflutter prior to surgery which has not been resumed. His medical history is also significant for cardiomyopathy, CAD EF 25-30% 2014, diverticulitis.  [name] reports ongoing RUQ,  coughing with posttussive vomiting, and SOB since the operation. He reports yellow clumpy emesis occurring daily usually in the early mornings. He has not had much of an appetite for the past week. Today is the first day he has had solid food and he was only able to take a few bites. He is c/o chest pain with deep breaths and SOB. He presented to the ED yesterday for worsening of these symptoms and is being admitted for further evaluation. He feels CP is from coughing, and stom pain. Labs at admission -  WBC 4.7 Hgb 11.3 K 4.6 Lipase 125 Troponin 0.22. CXR with cardiomegaly and mild central vascular congestion  [name] receives dialysis MWF at [facility]. Last HD was 03/12/16. He is compliant with treatments and no issues noted recently.   Past Medical History:  Diagnosis Date  . Atrial flutter ([place])   . 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 Cardiomyopathy   . CHF (congestive heart failure) ([place])   . Coronary artery disease    normal coronaries by 10/10/08 cath, Cardiac Cath 08-04-12 epic.Dr. Doylene Canard follows  . Diabetes mellitus    pt. states he's borderline diabetic., no longer taking med,- off med. since 2013  . Dialysis patient [facility])    Mon-Wed-Fri([place])- Left AV fistula  . Diverticulitis November 2016    reoccurred in December 2016  . ESRD (end stage renal disease) ([place])   . GERD (gastroesophageal reflux disease)   . Gout   . History of nephrectomy 07/04/2012   History of right nephrectomy in 2002 for renal cell carcinoma   . History of unilateral nephrectomy   . Hypertension   . Low iron   . Myocardial infarction ?2006  . Renal cell carcinoma    dialysis- MWF- Dr. Mercy Moore follows.  . Renal insufficiency   . Shortness of breath 05/19/11   "at rest, lying down, w/exertion"  . Stroke [facility]) 02/2011   05/19/11 denies residual  . Umbilical hernia 16/10/96   unrepaired   Past Surgical History:  Procedure Laterality Date  . AV FISTULA PLACEMENT  03/29/2011   Procedure: ARTERIOVENOUS (AV) FISTULA CREATION;  Surgeon: Hinda Lenis, MD;  Location: [place];  Service: Vascular;  Laterality: Left;  LEFT RADIOCEPHALIC , Arteriovenous [id number])  . CARDIAC CATHETERIZATION  05/21/11  . CHOLECYSTECTOMY N/A 02/12/2016   Procedure: LAPAROSCOPIC CHOLECYSTECTOMY WITH INTRAOPERATIVE CHOLANGIOGRAM;  Surgeon: Jackolyn Confer, MD;  Location: [place];  Service: General;  Laterality: N/A;  . dialysis cath placed    . ESOPHAGOGASTRODUODENOSCOPY (EGD) WITH PROPOFOL N/A 12/02/2015   Procedure: ESOPHAGOGASTRODUODENOSCOPY (EGD) WITH PROPOFOL;  Surgeon: Juanita Craver, MD;  Location: [facility];  Service: Endoscopy;  Laterality: N/A;  . FINGER SURGERY     L pinkie finger- ORIF- /w remaining hardware - 1990's    . HERNIA REPAIR N/A 7/82/9562   Umbilical hernia repair  . INSERTION  OF DIALYSIS CATHETER N/A 01/27/2016   Procedure: INSERTION OF DIALYSIS CATHETER;  Surgeon: Waynetta Sandy, MD;  Location: [place];  Service: Vascular;  Laterality: N/A;  . INSERTION OF MESH N/A 07/04/2012   Procedure: INSERTION OF MESH;  Surgeon: Madilyn Hook, DO;  Location: [place];  Service: General;  Laterality: N/A;  . LAPAROSCOPIC LYSIS OF ADHESIONS N/A 02/12/2016   Procedure: LAPAROSCOPIC LYSIS OF ADHESIONS;  Surgeon:  Jackolyn Confer, MD;  Location: [place];  Service: General;  Laterality: N/A;  . LEFT AND RIGHT HEART CATHETERIZATION WITH CORONARY ANGIOGRAM N/A 08/04/2012   Procedure: LEFT AND RIGHT HEART CATHETERIZATION WITH CORONARY ANGIOGRAM;  Surgeon: Birdie Riddle, MD;  Location: [place] CATH LAB;  Service: Cardiovascular;  Laterality: N/A;  . NEPHRECTOMY  2000   right  . REVISON OF ARTERIOVENOUS FISTULA Left 06/05/2013   Procedure: REVISON OF LEFT RADIOCEPHALIC  ARTERIOVENOUS FISTULA;  Surgeon: Conrad Thurston, MD;  Location: [place];  Service: Vascular;  Laterality: Left;  . REVISON OF ARTERIOVENOUS FISTULA Left 01/27/2016   Procedure: REVISION OF LEFT UPPER EXTREMITY ARTERIOVENOUS FISTULA;  Surgeon: Waynetta Sandy, MD;  Location: [place];  Service: Vascular;  Laterality: Left;  . RIGHT HEART CATHETERIZATION N/A 05/21/2011   Procedure: RIGHT HEART CATH;  Surgeon: Birdie Riddle, MD;  Location: [facility] CATH LAB;  Service: Cardiovascular;  Laterality: N/A;  . smashed  1990's   "left pinky; have a plate in there"  . UMBILICAL HERNIA REPAIR N/A 07/04/2012   Procedure: LAPAROSCOPIC UMBILICAL HERNIA;  Surgeon: Madilyn Hook, DO;  Location: [place];  Service: General;  Laterality: N/A;  . US ECHOCARDIOGRAPHY  05/20/11   Family History  Problem Relation Age of Onset  . Hypertension Mother   . Kidney disease Mother    Social History:  reports that he has never smoked. He has never used smokeless tobacco. He reports that he does not drink alcohol or use drugs. Allergies  Allergen Reactions  . Ace Inhibitors Itching and Cough   Prior to Admission medications   Medication Sig Start Date End Date Taking? Authorizing Provider  allopurinol (ZYLOPRIM) 300 MG tablet Take 150 mg by mouth daily. 12/07/15  Yes Historical Provider, MD  amiodarone (PACERONE) 200 MG tablet Take 0.5 tablets (100 mg total) by mouth daily. 07/05/15  Yes Dixie Dials, MD  amLODipine (NORVASC) 5 MG tablet Take 5 mg by mouth daily.    Yes Historical  Provider, MD  carvedilol (COREG) 25 MG tablet Take 0.5 tablets (12.5 mg total) by mouth See admin instructions. Take 1/2 tablet (12.5 mg) by mouth in the evening on dialysis (Monday, Wednesday, Friday and 1/2 tablet (12.5 mg) twice daily on Tuesday, Thursday, Saturday, Sunday (non-dialysis days) 07/05/15  Yes Dixie Dials, MD  Cholecalciferol (VITAMIN D PO) Take 1 tablet by mouth daily.   Yes Historical Provider, MD  cinacalcet (SENSIPAR) 90 MG tablet Take 90 mg by mouth daily.    Yes Historical Provider, MD  isosorbide dinitrate (ISORDIL) 20 MG tablet Take 20 mg by mouth daily.    Yes Historical Provider, MD  lidocaine-prilocaine (EMLA) cream Apply 1 application topically every Monday, Wednesday, and Friday. Apply to port access prior to dialysis 04/08/15  Yes Historical Provider, MD  nitroGLYCERIN (NITROSTAT) 0.4 MG SL tablet Place 0.4 mg under the tongue every 5 (five) minutes as needed for chest pain.  05/20/15  Yes Historical Provider, MD  oxyCODONE-acetaminophen (PERCOCET/ROXICET) 5-325 MG tablet Take 1-2 tablets by mouth every 4 (four) hours as needed for moderate pain.  02/13/16  Yes Jackolyn Confer, MD  pantoprazole (PROTONIX) 40 MG tablet Take 1 tablet (40 mg total) by mouth 2 (two) times daily. 01/27/16  Yes Alvia Grove, PA-C  sevelamer carbonate (RENVELA) 800 MG tablet Take 3 tablets (2,400 mg total) by mouth 3 (three) times daily with meals. 07/05/15  Yes Dixie Dials, MD  warfarin (COUMADIN) 6 MG tablet Take 9 mg by mouth daily. 02/22/16  Yes Historical Provider, MD   Current Facility-Administered Medications  Medication Dose Route Frequency Provider Last Rate Last Dose  . allopurinol (ZYLOPRIM) tablet 150 mg  150 mg Oral Daily Etta Quill, DO      . amiodarone (PACERONE) tablet 100 mg  100 mg Oral Daily Etta Quill, DO      . amLODipine (NORVASC) tablet 5 mg  5 mg Oral Daily Jared M Gardner, DO      . carvedilol (COREG) tablet 12.5 mg  12.5 mg Oral Once per day on Mon Wed Fri  Jared M Gardner, DO      . [START ON 03/16/2016] carvedilol (COREG) tablet 12.5 mg  12.5 mg Oral 2 times per day on Sun Tue Thu Sat Etta Quill, DO      . cinacalcet [facility]) tablet 90 mg  90 mg Oral Q breakfast Etta Quill, DO      . heparin ADULT infusion 100 units/mL (25000 units/239mL sodium chloride 0.45%)  1,500 Units/hr Intravenous Continuous Franky Macho, RPH 15 mL/hr at 03/15/16 0527 1,500 Units/hr at 03/15/16 0527  . isosorbide dinitrate (ISORDIL) tablet 20 mg  20 mg Oral Daily Etta Quill, DO      . nitroGLYCERIN (NITROSTAT) SL tablet 0.4 mg  0.4 mg Sublingual Q5 min PRN Etta Quill, DO      . oxyCODONE-acetaminophen (PERCOCET/ROXICET) 5-325 MG per tablet 1-2 tablet  1-2 tablet Oral Q4H PRN Etta Quill, DO   2 tablet at 03/15/16 1119  . pantoprazole (PROTONIX) EC tablet 40 mg  40 mg Oral BID Etta Quill, DO      . sevelamer carbonate (RENVELA) tablet 2,400 mg  2,400 mg Oral TID WC Etta Quill, DO   2,400 mg at 03/15/16 1318  . sodium chloride flush (NS) 0.9 % injection 3 mL  3 mL Intravenous Q12H Etta Quill, DO       Labs: Basic Metabolic Panel:  Recent Labs Lab 03/14/16 2214  NA 136  K 4.6  CL 100*  CO2 25  GLUCOSE 98  BUN 50*  CREATININE 13.40*  CALCIUM 8.1*   Liver Function Tests:  Recent Labs Lab 03/14/16 2214  AST 13*  ALT 12*  ALKPHOS 101  BILITOT 0.9  PROT 7.3  ALBUMIN 3.4*    Recent Labs Lab 03/14/16 2214  LIPASE 125*   No results for input(s): AMMONIA in the last 168 hours. CBC:  Recent Labs Lab 03/14/16 2214  WBC 4.7  NEUTROABS 2.9  HGB 11.3*  HCT 34.6*  MCV 96.4  PLT 144*   Cardiac Enzymes:  Recent Labs Lab 03/14/16 2214 03/15/16 0119 03/15/16 0437 03/15/16 1035  TROPONINI 0.22* 0.21* 0.22* 0.24*   CBG: No results for input(s): GLUCAP in the last 168 hours. Iron Studies: No results for input(s): IRON, TIBC, TRANSFERRIN, FERRITIN in the last 72 hours. Studies/Results: Dg Chest 2  View  Result Date: 03/14/2016 CLINICAL DATA:  Shortness of breath EXAM: CHEST  2 VIEW COMPARISON:  None. FINDINGS: Slight elevation of the left diaphragm. There is mild to  moderate enlargement of the cardiomediastinal silhouette. There is mild central vascular congestion. No pleural effusion or focal consolidation. Mild atherosclerosis of the aorta. No pneumothorax. IMPRESSION: Stable cardiomegaly with mild central vascular congestion. No overt edema, consolidation or effusion. Atherosclerotic vascular disease of the aorta. Electronically Signed   By: Donavan Foil M.D.   On: 03/14/2016 23:41   Dg Chest 2 View  Result Date: 03/14/2016 CLINICAL DATA:  Congestion for 5 days with productive cough, vomiting, nausea, right scapular pain, and right rib pain. EXAM: CHEST  2 VIEW COMPARISON:  09/07/2015 FINDINGS: Enlargement of the cardiac silhouette is unchanged. There is mild prominence of the central pulmonary arteries with decreased pulmonary vascular congestion compared to the prior study. There is no evidence confluent airspace opacity, edema, pleural effusion, or pneumothorax. No acute osseous abnormality is identified. IMPRESSION: Cardiomegaly with decreased pulmonary vascular congestion. No evidence of edema or pneumonia. Electronically Signed   By: Logan Bores M.D.   On: 03/14/2016 18:53   Ct Angio Chest Pe W And/or Wo Contrast  Result Date: 03/15/2016 CLINICAL DATA:  55 year old male with back pain and pleuritic chest pain. EXAM: CT ANGIOGRAPHY CHEST CT ABDOMEN AND PELVIS WITH CONTRAST TECHNIQUE: Multidetector CT imaging of the chest was performed using the standard protocol during bolus administration of intravenous contrast. Multiplanar CT image reconstructions and MIPs were obtained to evaluate the vascular anatomy. Multidetector CT imaging of the abdomen and pelvis was performed using the standard protocol during bolus administration of intravenous contrast. CONTRAST:  80 cc Isovue 370  COMPARISON:  CT of the abdomen pelvis dated 09/07/2015 FINDINGS: CTA CHEST FINDINGS Cardiovascular: The thoracic aorta appears unremarkable. There is no aneurysmal dilatation or evidence of dissection. The origins of the great vessels of the aortic arch appear patent. There is non opacification of the left subclavian artery, likely related to timing of the contrast. There is mild dilatation of the main pulmonary trunk which may be represent a degree of pulmonary hypertension. Evaluation of the pulmonary arteries is limited due to respiratory motion artifact as well as streak artifact caused by patient's arms. No definite large central pulmonary artery embolus identified. There is moderate cardiomegaly. Multi vessel coronary vascular disease. No pericardial effusion. Mediastinum/Nodes: There is no hilar or mediastinal adenopathy. The esophagus and the thyroid gland are grossly unremarkable. Lungs/Pleura: The lungs are clear. There is no pleural effusion or pneumothorax. The central airways are patent. Musculoskeletal: Degenerative changes of the spine. No acute fracture. Review of the MIP images confirms the above findings. CT ABDOMEN and PELVIS FINDINGS No intra-abdominal free air.  Small subhepatic free fluid. Hepatobiliary: A 9 mm left hepatic hypodense lesion is not well characterized but appears similar to prior CT and may represent a cyst or hemangioma. The liver is otherwise unremarkable. Mild intrahepatic biliary ductal dilatation. There is postsurgical changes of cholecystectomy. There is a small amount of fluid as well as inflammatory changes of the cholecystectomy bed. Although these may be postsurgical, findings are concerning for an infectious process or bile leak 1 month postoperative. Correlation with clinical exam recommended. A hepatobiliary scintigraphy may provide better evaluation and better assessment for possibility of bile leak. No discrete drainable fluid collection identified at this time. No  retained CBD stone noted. Pancreas: Unremarkable. No pancreatic ductal dilatation or surrounding inflammatory changes. Spleen: Normal in size without focal abnormality. Adrenals/Urinary Tract: The adrenal glands appear unremarkable. Status post prior right nephrectomy. There is marked left renal atrophy. Innumerable left renal hypodense lesions are not well characterized on this  CT but may represent cysts. MRI or ultrasound may provide better characterization. This findings is similar to prior CT. There is no hydronephrosis on the left. No stone identified. The visualized ureter appears unremarkable. The urinary bladder is collapsed. Stomach/Bowel: There is sigmoid diverticulosis without active inflammatory changes. Scattered colonic diverticula in the proximal transverse as well as in the ascending colon noted. Mild haziness of the diverticula at the hepatic flexure, likely secondary to extension of inflammatory changes of the cholecystectomy. Diverticulitis is less likely. There is no evidence of bowel obstruction. Normal appendix. Vascular/Lymphatic: Mild aortoiliac atherosclerotic disease. There is no aneurysmal dilatation or evidence of dissection. The origins of the celiac axis, SMA, IMA appear patent. The IVC appears patent. No portal venous gas identified. The SMV, splenic vein, and main portal vein are patent. There is no adenopathy. Reproductive: The prostate and seminal vesicles are grossly unremarkable. Other: Small fat containing umbilical hernia. Right anterior abdominal wall hernia repair mesh. Musculoskeletal: Degenerative changes of the spine. No acute fracture. Review of the MIP images confirms the above findings. IMPRESSION: No CT evidence of aortic dissection or central pulmonary artery embolus. Inflammatory changes of the cholecystectomy with small amount of fluid in the subhepatic area concerning for an infectious process or bile leak. Correlation with clinical exam recommended. A hepatobiliary  scintigraphy may provide better evaluation and assistant for possible chronic. No drainable fluid collection. Colonic diverticulosis without active inflammatory changes. No evidence of bowel obstruction. Normal appendix. Electronically Signed   By: Anner Crete M.D.   On: 03/15/2016 03:35   Nm Hepatobiliary Liver Func  Result Date: 03/15/2016 CLINICAL DATA:  Cholecystectomy 02/12/2016. RIGHT-sided abdominal pain and back pain. EXAM: NUCLEAR MEDICINE HEPATOBILIARY IMAGING TECHNIQUE: Sequential images of the abdomen were obtained out to 60 minutes following intravenous administration of radiopharmaceutical. RADIOPHARMACEUTICALS:  5.0 mCi Tc-72m  Choletec IV COMPARISON:  CT 03/15/2016 FINDINGS: Uniform uptake of radiotracer within the liver. Counts are evident within the common bile duct and small bowel by 30 minutes. There is no evidence of extra biliary radiotracer to suggest bile leak. IMPRESSION: 1. No evidence of bile leak. 2. Patent common bile duct. Electronically Signed   By: Suzy Bouchard M.D.   On: 03/15/2016 11:15   Ct Abdomen Pelvis W Contrast  Result Date: 03/15/2016 CLINICAL DATA:  55 year old male with back pain and pleuritic chest pain. EXAM: CT ANGIOGRAPHY CHEST CT ABDOMEN AND PELVIS WITH CONTRAST TECHNIQUE: Multidetector CT imaging of the chest was performed using the standard protocol during bolus administration of intravenous contrast. Multiplanar CT image reconstructions and MIPs were obtained to evaluate the vascular anatomy. Multidetector CT imaging of the abdomen and pelvis was performed using the standard protocol during bolus administration of intravenous contrast. CONTRAST:  80 cc Isovue 370 COMPARISON:  CT of the abdomen pelvis dated 09/07/2015 FINDINGS: CTA CHEST FINDINGS Cardiovascular: The thoracic aorta appears unremarkable. There is no aneurysmal dilatation or evidence of dissection. The origins of the great vessels of the aortic arch appear patent. There is non  opacification of the left subclavian artery, likely related to timing of the contrast. There is mild dilatation of the main pulmonary trunk which may be represent a degree of pulmonary hypertension. Evaluation of the pulmonary arteries is limited due to respiratory motion artifact as well as streak artifact caused by patient's arms. No definite large central pulmonary artery embolus identified. There is moderate cardiomegaly. Multi vessel coronary vascular disease. No pericardial effusion. Mediastinum/Nodes: There is no hilar or mediastinal adenopathy. The esophagus and the thyroid  gland are grossly unremarkable. Lungs/Pleura: The lungs are clear. There is no pleural effusion or pneumothorax. The central airways are patent. Musculoskeletal: Degenerative changes of the spine. No acute fracture. Review of the MIP images confirms the above findings. CT ABDOMEN and PELVIS FINDINGS No intra-abdominal free air.  Small subhepatic free fluid. Hepatobiliary: A 9 mm left hepatic hypodense lesion is not well characterized but appears similar to prior CT and may represent a cyst or hemangioma. The liver is otherwise unremarkable. Mild intrahepatic biliary ductal dilatation. There is postsurgical changes of cholecystectomy. There is a small amount of fluid as well as inflammatory changes of the cholecystectomy bed. Although these may be postsurgical, findings are concerning for an infectious process or bile leak 1 month postoperative. Correlation with clinical exam recommended. A hepatobiliary scintigraphy may provide better evaluation and better assessment for possibility of bile leak. No discrete drainable fluid collection identified at this time. No retained CBD stone noted. Pancreas: Unremarkable. No pancreatic ductal dilatation or surrounding inflammatory changes. Spleen: Normal in size without focal abnormality. Adrenals/Urinary Tract: The adrenal glands appear unremarkable. Status post prior right nephrectomy. There is  marked left renal atrophy. Innumerable left renal hypodense lesions are not well characterized on this CT but may represent cysts. MRI or ultrasound may provide better characterization. This findings is similar to prior CT. There is no hydronephrosis on the left. No stone identified. The visualized ureter appears unremarkable. The urinary bladder is collapsed. Stomach/Bowel: There is sigmoid diverticulosis without active inflammatory changes. Scattered colonic diverticula in the proximal transverse as well as in the ascending colon noted. Mild haziness of the diverticula at the hepatic flexure, likely secondary to extension of inflammatory changes of the cholecystectomy. Diverticulitis is less likely. There is no evidence of bowel obstruction. Normal appendix. Vascular/Lymphatic: Mild aortoiliac atherosclerotic disease. There is no aneurysmal dilatation or evidence of dissection. The origins of the celiac axis, SMA, IMA appear patent. The IVC appears patent. No portal venous gas identified. The SMV, splenic vein, and main portal vein are patent. There is no adenopathy. Reproductive: The prostate and seminal vesicles are grossly unremarkable. Other: Small fat containing umbilical hernia. Right anterior abdominal wall hernia repair mesh. Musculoskeletal: Degenerative changes of the spine. No acute fracture. Review of the MIP images confirms the above findings. IMPRESSION: No CT evidence of aortic dissection or central pulmonary artery embolus. Inflammatory changes of the cholecystectomy with small amount of fluid in the subhepatic area concerning for an infectious process or bile leak. Correlation with clinical exam recommended. A hepatobiliary scintigraphy may provide better evaluation and assistant for possible chronic. No drainable fluid collection. Colonic diverticulosis without active inflammatory changes. No evidence of bowel obstruction. Normal appendix. Electronically Signed   By: Anner Crete M.D.   On:  03/15/2016 03:35    ROS: As per HPI otherwise negative.  Physical Exam: Vitals:   03/15/16 0400 03/15/16 0422 03/15/16 0430 03/15/16 0521  BP: 151/93  155/86 (!) 167/107  Pulse: 70 72 70 68  Resp:  15 14 17   Temp:    97.7 F (36.5 C)  TempSrc:    Oral  SpO2: 96% 98% 97% 100%  Weight:      Height:         General: WNWD AAM appears uncomfortable Head: NCAT sclera not icteric MMM  Pale R Retina. Neck: Supple. PCL Lungs: Fine crackles at LLB Breathing is unlabored. Heart: RRR with S1 S2. Gr 2/6 holosys M Abdomen:  Hyperactive BS tender to light palpation all 4 quadrants, worse  LUQ, BS erratic  Lower extremities:without edema or ischemic changes, no open wounds  Neuro: A & O  X 3. Moves all extremities spontaneously. Psych:  Responds to questions appropriately with a normal affect. Dialysis Access: LUE AVF +thrill/bruit   Dialysis Orders:  [facility] MWF  4h 180 F 2k/2.25Ca 450/800 EDW 109kg Hectorol 23mcg IV q week  Heparin 7000 U IV q treatment OP Labs: Hgb 11.6 Ca 9.7 P 4.6 iPTH 207  Assessment/Plan: 1.  Abdominal pain s/p cholecystectomy 02/12/16 - per primary - CT angio today with "small amount of fluid in the subhepatic area concerning for infectious process vs. Bile leak" HIDA scan this am to f/u  "no evidence of bile leakage" per surgery Sx of Partial sBO,  Had lysis of adhesions, ? Contrib.   Very abn ABDOMENAL EXAM 2.  ESRD -  Cont HD MWF on schedule - For HD today  3.  Hypertension/volume  - BP elevated similar to OP pre HD readings - should improve with HD today - cont home meds-  amlodipine/carvedilol/isosorbide/Due for HD today UF goal 3L Lower vol, lower meds 4.  Anemia  - Hgb 11.3 ESA low dose 5.  Metabolic bone disease -  Corr Ca 8.5 on VDRA recently lowered for low Ca 6.  Nutrition - Albumin 3.4 Poor PO intake with N/V add renal vitamin  7. Elevated troponin- per primary trending  8. Obesity 9. DM  Lynnda Child PA-C [facility] Pager  [phone] 03/15/2016, 1:33 PM I have seen and examined this patient and agree with the plan of care seen, eval, examined discussed with PA, primary MD, and patient. .  Nyron Mozer L 03/15/2016, 2:30 PM

## 2016-03-15 NOTE — Progress Notes (Signed)
Patient ID: [name], male   DOB: 09-Dec-1960, 54 y.o.   MRN: [id number] Nephrology:         Please let us know if become inpatient status and we will consult and do HD.  Otherwise D/C to home HD unit.

## 2016-03-15 NOTE — ED Notes (Signed)
Attempted report x1. Instructed to call back in 10 minutes.

## 2016-03-15 NOTE — ED Notes (Signed)
Pt presents with cough, sometimes productive yellow phlegm, x 8 weeks.  Gallbladder removed 1 month ago.  Cough has not improved, sometimes causes vomiting.  Onset early this morning of severe coughing episode causing right rib pain, lateral side of abd pain at rib cage.

## 2016-03-15 NOTE — Progress Notes (Signed)
Patient in nuclear medicine getting a HIDA scan.  Will recheck later today.  [name]. Dahlia Bailiff, MD, [place] [phone] [facility] Surgery

## 2016-03-15 NOTE — Consult Note (Signed)
Referring Physician:  [name] is an 55 y.o. male.                       Chief Complaint: Chest pain  HPI: 55 year old male with chest pain, shortness of breath and abnormal troponin-I has PMH of ESRD, dilated and non-ischemic cardiomyopathy, CAD, DM, II, Obesity, Hypertension. He is one month post lap cholecystectomy. GI work up for bile leak was negative.   Past Medical History:  Diagnosis Date  . Atrial flutter ([place])   . 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 Cardiomyopathy   . CHF (congestive heart failure) ([place])   . Coronary artery disease    normal coronaries by 10/10/08 cath, Cardiac Cath 08-04-12 epic.Dr. Doylene Canard follows  . Diabetes mellitus    pt. states he's borderline diabetic., no longer taking med,- off med. since 2013  . Dialysis patient [facility])    Mon-Wed-Fri([place])- Left AV fistula  . Diverticulitis November 2016   reoccurred in December 2016  . ESRD (end stage renal disease) ([place])   . GERD (gastroesophageal reflux disease)   . Gout   . History of nephrectomy 07/04/2012   History of right nephrectomy in 2002 for renal cell carcinoma   . History of unilateral nephrectomy   . Hypertension   . Low iron   . Myocardial infarction ?2006  . Renal cell carcinoma    dialysis- MWF- Dr. Mercy Moore follows.  . Renal insufficiency   . Shortness of breath 05/19/11   "at rest, lying down, w/exertion"  . Stroke [facility]) 02/2011   05/19/11 denies residual  . Umbilical hernia 68/12/75   unrepaired      Past Surgical History:  Procedure Laterality Date  . AV FISTULA PLACEMENT  03/29/2011   Procedure: ARTERIOVENOUS (AV) FISTULA CREATION;  Surgeon: Hinda Lenis, MD;  Location: [place];  Service: Vascular;  Laterality: Left;  LEFT RADIOCEPHALIC , Arteriovenous [id number])  . CARDIAC CATHETERIZATION  05/21/11  . CHOLECYSTECTOMY N/A 02/12/2016   Procedure: LAPAROSCOPIC CHOLECYSTECTOMY WITH INTRAOPERATIVE CHOLANGIOGRAM;  Surgeon: Jackolyn Confer, MD;  Location: [place];  Service: General;  Laterality: N/A;  . dialysis cath placed    . ESOPHAGOGASTRODUODENOSCOPY (EGD) WITH PROPOFOL N/A 12/02/2015   Procedure: ESOPHAGOGASTRODUODENOSCOPY (EGD) WITH PROPOFOL;  Surgeon: Juanita Craver, MD;  Location: [facility];  Service: Endoscopy;  Laterality: N/A;  . FINGER SURGERY     L pinkie finger- ORIF- /w remaining hardware - 1990's    . HERNIA REPAIR N/A 7/59/1638   Umbilical hernia repair  . INSERTION OF DIALYSIS CATHETER N/A 01/27/2016   Procedure: INSERTION OF DIALYSIS CATHETER;  Surgeon: Waynetta Sandy, MD;  Location: [place];  Service: Vascular;  Laterality: N/A;  . INSERTION OF MESH N/A 07/04/2012   Procedure: INSERTION OF MESH;  Surgeon: Madilyn Hook, DO;  Location: [place];  Service: General;  Laterality: N/A;  . LAPAROSCOPIC LYSIS OF ADHESIONS N/A 02/12/2016   Procedure: LAPAROSCOPIC LYSIS OF ADHESIONS;  Surgeon: Jackolyn Confer, MD;  Location: [place];  Service: General;  Laterality: N/A;  . LEFT AND RIGHT HEART CATHETERIZATION WITH CORONARY ANGIOGRAM N/A 08/04/2012   Procedure: LEFT AND RIGHT HEART CATHETERIZATION WITH CORONARY ANGIOGRAM;  Surgeon: Birdie Riddle, MD;  Location: [place] CATH LAB;  Service: Cardiovascular;  Laterality: N/A;  . NEPHRECTOMY  2000   right  . REVISON OF ARTERIOVENOUS FISTULA Left 06/05/2013   Procedure: REVISON OF LEFT RADIOCEPHALIC  ARTERIOVENOUS FISTULA;  Surgeon:  Conrad Zap, MD;  Location: [place];  Service: Vascular;  Laterality: Left;  . REVISON OF ARTERIOVENOUS FISTULA Left 01/27/2016   Procedure: REVISION OF LEFT UPPER EXTREMITY ARTERIOVENOUS FISTULA;  Surgeon: Waynetta Sandy, MD;  Location: [place];  Service: Vascular;  Laterality: Left;  . RIGHT HEART CATHETERIZATION N/A 05/21/2011   Procedure: RIGHT HEART CATH;  Surgeon: Birdie Riddle, MD;  Location: [facility] CATH LAB;  Service: Cardiovascular;  Laterality: N/A;  . smashed  1990's   "left pinky; have a plate in there"  . UMBILICAL HERNIA  REPAIR N/A 07/04/2012   Procedure: LAPAROSCOPIC UMBILICAL HERNIA;  Surgeon: Madilyn Hook, DO;  Location: [place];  Service: General;  Laterality: N/A;  . US ECHOCARDIOGRAPHY  05/20/11    Family History  Problem Relation Age of Onset  . Hypertension Mother   . Kidney disease Mother    Social History:  reports that he has never smoked. He has never used smokeless tobacco. He reports that he does not drink alcohol or use drugs.  Allergies:  Allergies  Allergen Reactions  . Ace Inhibitors Itching and Cough    Medications Prior to Admission  Medication Sig Dispense Refill  . allopurinol (ZYLOPRIM) 300 MG tablet Take 150 mg by mouth daily.  6  . amiodarone (PACERONE) 200 MG tablet Take 0.5 tablets (100 mg total) by mouth daily. 15 tablet 6  . amLODipine (NORVASC) 5 MG tablet Take 5 mg by mouth daily.     . carvedilol (COREG) 25 MG tablet Take 0.5 tablets (12.5 mg total) by mouth See admin instructions. Take 1/2 tablet (12.5 mg) by mouth in the evening on dialysis (Monday, Wednesday, Friday and 1/2 tablet (12.5 mg) twice daily on Tuesday, Thursday, Saturday, Sunday (non-dialysis days)    . Cholecalciferol (VITAMIN D PO) Take 1 tablet by mouth daily.    . cinacalcet (SENSIPAR) 90 MG tablet Take 90 mg by mouth daily.     . isosorbide dinitrate (ISORDIL) 20 MG tablet Take 20 mg by mouth daily.     Marland Kitchen lidocaine-prilocaine (EMLA) cream Apply 1 application topically every Monday, Wednesday, and Friday. Apply to port access prior to dialysis  12  . nitroGLYCERIN (NITROSTAT) 0.4 MG SL tablet Place 0.4 mg under the tongue every 5 (five) minutes as needed for chest pain.   0  . oxyCODONE-acetaminophen (PERCOCET/ROXICET) 5-325 MG tablet Take 1-2 tablets by mouth every 4 (four) hours as needed for moderate pain. 30 tablet 0  . pantoprazole (PROTONIX) 40 MG tablet Take 1 tablet (40 mg total) by mouth 2 (two) times daily. 60 tablet 1  . sevelamer carbonate (RENVELA) 800 MG tablet Take 3 tablets (2,400 mg  total) by mouth 3 (three) times daily with meals. 270 tablet 3  . warfarin (COUMADIN) 6 MG tablet Take 9 mg by mouth daily.      Results for orders placed or performed during the hospital encounter of 03/14/16 (from the past 48 hour(s))  CBC with Differential/Platelet     Status: Abnormal   Collection Time: 03/14/16 10:14 PM  Result Value Ref Range   WBC 4.7 4.0 - 10.5 K/uL   RBC 3.59 (L) 4.22 - 5.81 MIL/uL   Hemoglobin 11.3 (L) 13.0 - 17.0 g/dL   HCT 34.6 (L) 39.0 - 52.0 %   MCV 96.4 78.0 - 100.0 fL   MCH 31.5 26.0 - 34.0 pg   MCHC 32.7 30.0 - 36.0 g/dL   RDW 15.0 11.5 - 15.5 %   Platelets 144 (L) 150 -  400 K/uL   Neutrophils Relative % 61 %   Neutro Abs 2.9 1.7 - 7.7 K/uL   Lymphocytes Relative 26 %   Lymphs Abs 1.2 0.7 - 4.0 K/uL   Monocytes Relative 9 %   Monocytes Absolute 0.4 0.1 - 1.0 K/uL   Eosinophils Relative 4 %   Eosinophils Absolute 0.2 0.0 - 0.7 K/uL   Basophils Relative 0 %   Basophils Absolute 0.0 0.0 - 0.1 K/uL  Comprehensive metabolic panel     Status: Abnormal   Collection Time: 03/14/16 10:14 PM  Result Value Ref Range   Sodium 136 135 - 145 mmol/L   Potassium 4.6 3.5 - 5.1 mmol/L   Chloride 100 (L) 101 - 111 mmol/L   CO2 25 22 - 32 mmol/L   Glucose, Bld 98 65 - 99 mg/dL   BUN 50 (H) 6 - 20 mg/dL   Creatinine, Ser 13.40 (H) 0.61 - 1.24 mg/dL   Calcium 8.1 (L) 8.9 - 10.3 mg/dL   Total Protein 7.3 6.5 - 8.1 g/dL   Albumin 3.4 (L) 3.5 - 5.0 g/dL   AST 13 (L) 15 - 41 U/L   ALT 12 (L) 17 - 63 U/L   Alkaline Phosphatase 101 38 - 126 U/L   Total Bilirubin 0.9 0.3 - 1.2 mg/dL   GFR calc non Af Amer 4 (L) >60 mL/min   GFR calc Af Amer 4 (L) >60 mL/min    Comment: (NOTE) The eGFR has been calculated using the CKD EPI equation. This calculation has not been validated in all clinical situations. eGFR's persistently <60 mL/min signify possible Chronic Kidney Disease.    Anion gap 11 5 - 15  Lipase, blood     Status: Abnormal   Collection Time: 03/14/16  10:14 PM  Result Value Ref Range   Lipase 125 (H) 11 - 51 U/L  Troponin I     Status: Abnormal   Collection Time: 03/14/16 10:14 PM  Result Value Ref Range   Troponin I 0.22 (HH) <0.03 ng/mL    Comment: CRITICAL RESULT CALLED TO, READ BACK BY AND VERIFIED WITH: OLDLAND,B RN 03/14/2016 [id number] JORDANS   Protime-INR     Status: None   Collection Time: 03/14/16 10:14 PM  Result Value Ref Range   Prothrombin Time 13.6 11.4 - 15.2 seconds   INR 1.04   Troponin I     Status: Abnormal   Collection Time: 03/15/16  1:19 AM  Result Value Ref Range   Troponin I 0.21 (HH) <0.03 ng/mL    Comment: CRITICAL VALUE NOTED.  VALUE IS CONSISTENT WITH PREVIOUSLY REPORTED AND CALLED VALUE.  Troponin I (q 6hr x 3)     Status: Abnormal   Collection Time: 03/15/16  4:37 AM  Result Value Ref Range   Troponin I 0.22 (HH) <0.03 ng/mL    Comment: CRITICAL VALUE NOTED.  VALUE IS CONSISTENT WITH PREVIOUSLY REPORTED AND CALLED VALUE.  Troponin I (q 6hr x 3)     Status: Abnormal   Collection Time: 03/15/16 10:35 AM  Result Value Ref Range   Troponin I 0.24 (HH) <0.03 ng/mL    Comment: CRITICAL VALUE NOTED.  VALUE IS CONSISTENT WITH PREVIOUSLY REPORTED AND CALLED VALUE.  Heparin level (unfractionated)     Status: Abnormal   Collection Time: 03/15/16  1:21 PM  Result Value Ref Range   Heparin Unfractionated 0.80 (H) 0.30 - 0.70 IU/mL    Comment:        IF HEPARIN RESULTS ARE BELOW EXPECTED  VALUES, AND PATIENT DOSAGE HAS BEEN CONFIRMED, SUGGEST FOLLOW UP TESTING OF ANTITHROMBIN III LEVELS.   Renal function panel     Status: Abnormal   Collection Time: 03/15/16  4:06 PM  Result Value Ref Range   Sodium 133 (L) 135 - 145 mmol/L   Potassium 6.1 (H) 3.5 - 5.1 mmol/L    Comment: DELTA CHECK NOTED HEMOLYSIS AT THIS LEVEL MAY AFFECT RESULT    Chloride 99 (L) 101 - 111 mmol/L   CO2 24 22 - 32 mmol/L   Glucose, Bld 102 (H) 65 - 99 mg/dL   BUN 60 (H) 6 - 20 mg/dL   Creatinine, Ser 15.23 (H) 0.61 - 1.24 mg/dL    Calcium 7.8 (L) 8.9 - 10.3 mg/dL   Phosphorus 4.1 2.5 - 4.6 mg/dL   Albumin 3.1 (L) 3.5 - 5.0 g/dL   GFR calc non Af Amer 3 (L) >60 mL/min   GFR calc Af Amer 4 (L) >60 mL/min    Comment: (NOTE) The eGFR has been calculated using the CKD EPI equation. This calculation has not been validated in all clinical situations. eGFR's persistently <60 mL/min signify possible Chronic Kidney Disease.    Anion gap 10 5 - 15  Troponin I (q 6hr x 3)     Status: Abnormal   Collection Time: 03/15/16  4:07 PM  Result Value Ref Range   Troponin I 0.21 (HH) <0.03 ng/mL    Comment: CRITICAL VALUE NOTED.  VALUE IS CONSISTENT WITH PREVIOUSLY REPORTED AND CALLED VALUE.  CBC     Status: Abnormal   Collection Time: 03/15/16  4:07 PM  Result Value Ref Range   WBC 3.9 (L) 4.0 - 10.5 K/uL   RBC 3.50 (L) 4.22 - 5.81 MIL/uL   Hemoglobin 11.0 (L) 13.0 - 17.0 g/dL   HCT 33.4 (L) 39.0 - 52.0 %   MCV 95.4 78.0 - 100.0 fL   MCH 31.4 26.0 - 34.0 pg   MCHC 32.9 30.0 - 36.0 g/dL   RDW 15.1 11.5 - 15.5 %   Platelets 140 (L) 150 - 400 K/uL   Dg Chest 2 View  Result Date: 03/14/2016 CLINICAL DATA:  Shortness of breath EXAM: CHEST  2 VIEW COMPARISON:  None. FINDINGS: Slight elevation of the left diaphragm. There is mild to moderate enlargement of the cardiomediastinal silhouette. There is mild central vascular congestion. No pleural effusion or focal consolidation. Mild atherosclerosis of the aorta. No pneumothorax. IMPRESSION: Stable cardiomegaly with mild central vascular congestion. No overt edema, consolidation or effusion. Atherosclerotic vascular disease of the aorta. Electronically Signed   By: Donavan Foil M.D.   On: 03/14/2016 23:41   Dg Chest 2 View  Result Date: 03/14/2016 CLINICAL DATA:  Congestion for 5 days with productive cough, vomiting, nausea, right scapular pain, and right rib pain. EXAM: CHEST  2 VIEW COMPARISON:  09/07/2015 FINDINGS: Enlargement of the cardiac silhouette is unchanged. There is mild  prominence of the central pulmonary arteries with decreased pulmonary vascular congestion compared to the prior study. There is no evidence confluent airspace opacity, edema, pleural effusion, or pneumothorax. No acute osseous abnormality is identified. IMPRESSION: Cardiomegaly with decreased pulmonary vascular congestion. No evidence of edema or pneumonia. Electronically Signed   By: Logan Bores M.D.   On: 03/14/2016 18:53   Ct Angio Chest Pe W And/or Wo Contrast  Result Date: 03/15/2016 CLINICAL DATA:  55 year old male with back pain and pleuritic chest pain. EXAM: CT ANGIOGRAPHY CHEST CT ABDOMEN AND PELVIS WITH CONTRAST TECHNIQUE: Multidetector  CT imaging of the chest was performed using the standard protocol during bolus administration of intravenous contrast. Multiplanar CT image reconstructions and MIPs were obtained to evaluate the vascular anatomy. Multidetector CT imaging of the abdomen and pelvis was performed using the standard protocol during bolus administration of intravenous contrast. CONTRAST:  80 cc Isovue 370 COMPARISON:  CT of the abdomen pelvis dated 09/07/2015 FINDINGS: CTA CHEST FINDINGS Cardiovascular: The thoracic aorta appears unremarkable. There is no aneurysmal dilatation or evidence of dissection. The origins of the great vessels of the aortic arch appear patent. There is non opacification of the left subclavian artery, likely related to timing of the contrast. There is mild dilatation of the main pulmonary trunk which may be represent a degree of pulmonary hypertension. Evaluation of the pulmonary arteries is limited due to respiratory motion artifact as well as streak artifact caused by patient's arms. No definite large central pulmonary artery embolus identified. There is moderate cardiomegaly. Multi vessel coronary vascular disease. No pericardial effusion. Mediastinum/Nodes: There is no hilar or mediastinal adenopathy. The esophagus and the thyroid gland are grossly  unremarkable. Lungs/Pleura: The lungs are clear. There is no pleural effusion or pneumothorax. The central airways are patent. Musculoskeletal: Degenerative changes of the spine. No acute fracture. Review of the MIP images confirms the above findings. CT ABDOMEN and PELVIS FINDINGS No intra-abdominal free air.  Small subhepatic free fluid. Hepatobiliary: A 9 mm left hepatic hypodense lesion is not well characterized but appears similar to prior CT and may represent a cyst or hemangioma. The liver is otherwise unremarkable. Mild intrahepatic biliary ductal dilatation. There is postsurgical changes of cholecystectomy. There is a small amount of fluid as well as inflammatory changes of the cholecystectomy bed. Although these may be postsurgical, findings are concerning for an infectious process or bile leak 1 month postoperative. Correlation with clinical exam recommended. A hepatobiliary scintigraphy may provide better evaluation and better assessment for possibility of bile leak. No discrete drainable fluid collection identified at this time. No retained CBD stone noted. Pancreas: Unremarkable. No pancreatic ductal dilatation or surrounding inflammatory changes. Spleen: Normal in size without focal abnormality. Adrenals/Urinary Tract: The adrenal glands appear unremarkable. Status post prior right nephrectomy. There is marked left renal atrophy. Innumerable left renal hypodense lesions are not well characterized on this CT but may represent cysts. MRI or ultrasound may provide better characterization. This findings is similar to prior CT. There is no hydronephrosis on the left. No stone identified. The visualized ureter appears unremarkable. The urinary bladder is collapsed. Stomach/Bowel: There is sigmoid diverticulosis without active inflammatory changes. Scattered colonic diverticula in the proximal transverse as well as in the ascending colon noted. Mild haziness of the diverticula at the hepatic flexure, likely  secondary to extension of inflammatory changes of the cholecystectomy. Diverticulitis is less likely. There is no evidence of bowel obstruction. Normal appendix. Vascular/Lymphatic: Mild aortoiliac atherosclerotic disease. There is no aneurysmal dilatation or evidence of dissection. The origins of the celiac axis, SMA, IMA appear patent. The IVC appears patent. No portal venous gas identified. The SMV, splenic vein, and main portal vein are patent. There is no adenopathy. Reproductive: The prostate and seminal vesicles are grossly unremarkable. Other: Small fat containing umbilical hernia. Right anterior abdominal wall hernia repair mesh. Musculoskeletal: Degenerative changes of the spine. No acute fracture. Review of the MIP images confirms the above findings. IMPRESSION: No CT evidence of aortic dissection or central pulmonary artery embolus. Inflammatory changes of the cholecystectomy with small amount of fluid in the  subhepatic area concerning for an infectious process or bile leak. Correlation with clinical exam recommended. A hepatobiliary scintigraphy may provide better evaluation and assistant for possible chronic. No drainable fluid collection. Colonic diverticulosis without active inflammatory changes. No evidence of bowel obstruction. Normal appendix. Electronically Signed   By: Anner Crete M.D.   On: 03/15/2016 03:35   Nm Hepatobiliary Liver Func  Result Date: 03/15/2016 CLINICAL DATA:  Cholecystectomy 02/12/2016. RIGHT-sided abdominal pain and back pain. EXAM: NUCLEAR MEDICINE HEPATOBILIARY IMAGING TECHNIQUE: Sequential images of the abdomen were obtained out to 60 minutes following intravenous administration of radiopharmaceutical. RADIOPHARMACEUTICALS:  5.0 mCi Tc-35m Choletec IV COMPARISON:  CT 03/15/2016 FINDINGS: Uniform uptake of radiotracer within the liver. Counts are evident within the common bile duct and small bowel by 30 minutes. There is no evidence of extra biliary radiotracer  to suggest bile leak. IMPRESSION: 1. No evidence of bile leak. 2. Patent common bile duct. Electronically Signed   By: SSuzy BouchardM.D.   On: 03/15/2016 11:15   Ct Abdomen Pelvis W Contrast  Result Date: 03/15/2016 CLINICAL DATA:  55year old male with back pain and pleuritic chest pain. EXAM: CT ANGIOGRAPHY CHEST CT ABDOMEN AND PELVIS WITH CONTRAST TECHNIQUE: Multidetector CT imaging of the chest was performed using the standard protocol during bolus administration of intravenous contrast. Multiplanar CT image reconstructions and MIPs were obtained to evaluate the vascular anatomy. Multidetector CT imaging of the abdomen and pelvis was performed using the standard protocol during bolus administration of intravenous contrast. CONTRAST:  80 cc Isovue 370 COMPARISON:  CT of the abdomen pelvis dated 09/07/2015 FINDINGS: CTA CHEST FINDINGS Cardiovascular: The thoracic aorta appears unremarkable. There is no aneurysmal dilatation or evidence of dissection. The origins of the great vessels of the aortic arch appear patent. There is non opacification of the left subclavian artery, likely related to timing of the contrast. There is mild dilatation of the main pulmonary trunk which may be represent a degree of pulmonary hypertension. Evaluation of the pulmonary arteries is limited due to respiratory motion artifact as well as streak artifact caused by patient's arms. No definite large central pulmonary artery embolus identified. There is moderate cardiomegaly. Multi vessel coronary vascular disease. No pericardial effusion. Mediastinum/Nodes: There is no hilar or mediastinal adenopathy. The esophagus and the thyroid gland are grossly unremarkable. Lungs/Pleura: The lungs are clear. There is no pleural effusion or pneumothorax. The central airways are patent. Musculoskeletal: Degenerative changes of the spine. No acute fracture. Review of the MIP images confirms the above findings. CT ABDOMEN and PELVIS FINDINGS No  intra-abdominal free air.  Small subhepatic free fluid. Hepatobiliary: A 9 mm left hepatic hypodense lesion is not well characterized but appears similar to prior CT and may represent a cyst or hemangioma. The liver is otherwise unremarkable. Mild intrahepatic biliary ductal dilatation. There is postsurgical changes of cholecystectomy. There is a small amount of fluid as well as inflammatory changes of the cholecystectomy bed. Although these may be postsurgical, findings are concerning for an infectious process or bile leak 1 month postoperative. Correlation with clinical exam recommended. A hepatobiliary scintigraphy may provide better evaluation and better assessment for possibility of bile leak. No discrete drainable fluid collection identified at this time. No retained CBD stone noted. Pancreas: Unremarkable. No pancreatic ductal dilatation or surrounding inflammatory changes. Spleen: Normal in size without focal abnormality. Adrenals/Urinary Tract: The adrenal glands appear unremarkable. Status post prior right nephrectomy. There is marked left renal atrophy. Innumerable left renal hypodense lesions are not well characterized  on this CT but may represent cysts. MRI or ultrasound may provide better characterization. This findings is similar to prior CT. There is no hydronephrosis on the left. No stone identified. The visualized ureter appears unremarkable. The urinary bladder is collapsed. Stomach/Bowel: There is sigmoid diverticulosis without active inflammatory changes. Scattered colonic diverticula in the proximal transverse as well as in the ascending colon noted. Mild haziness of the diverticula at the hepatic flexure, likely secondary to extension of inflammatory changes of the cholecystectomy. Diverticulitis is less likely. There is no evidence of bowel obstruction. Normal appendix. Vascular/Lymphatic: Mild aortoiliac atherosclerotic disease. There is no aneurysmal dilatation or evidence of dissection.  The origins of the celiac axis, SMA, IMA appear patent. The IVC appears patent. No portal venous gas identified. The SMV, splenic vein, and main portal vein are patent. There is no adenopathy. Reproductive: The prostate and seminal vesicles are grossly unremarkable. Other: Small fat containing umbilical hernia. Right anterior abdominal wall hernia repair mesh. Musculoskeletal: Degenerative changes of the spine. No acute fracture. Review of the MIP images confirms the above findings. IMPRESSION: No CT evidence of aortic dissection or central pulmonary artery embolus. Inflammatory changes of the cholecystectomy with small amount of fluid in the subhepatic area concerning for an infectious process or bile leak. Correlation with clinical exam recommended. A hepatobiliary scintigraphy may provide better evaluation and assistant for possible chronic. No drainable fluid collection. Colonic diverticulosis without active inflammatory changes. No evidence of bowel obstruction. Normal appendix. Electronically Signed   By: Anner Crete M.D.   On: 03/15/2016 03:35    Review Of Systems Constitutional: Negative for fever, weight loss and fatigue. HENT: Negative for sore throat, neck pain or sinus trouble. Respiratory: Positive for shortness of breath. Cardiovascular; Positive for chest pain, negative for palpitation. Gastrointestinal: Psitive for abdominal pain. Negative for GI bleed. Genitourinary: Negative for renal stone or dysuria. Positive for ESRD with hemodialysis. Skin: Negative for rash. Neurological: Negative for stroke, headache or dizziness.  Blood pressure (!) 145/94, pulse 81, temperature 98 F (36.7 C), resp. rate 18, height _0  (1.854 m), weight 113.9 kg (251 lb 1.7 oz), SpO2 100 %. Body mass index is 33.13 kg/m. General appearance: alert, cooperative, appears stated age and no distress. Well developed and well nourished. Head: Normocephalic, atraumatic Eyes: Brown eyes, conjunctivae/corneas  clear. No scleral icterus. PERRL, EOM's intact. Fundi benign. Neck: No adenopathy, no carotid bruit, no JVD, supple, symmetrical, trachea midline and thyroid not enlarged. Resp: Clear to auscultation bilaterally Cardio: Regular rate and rhythm, S1, S2 normal, II/VI systolic murmur, no click, rub or gallop GI: soft, non-tender; bowel sounds normal; no masses,  no organomegaly. Extremities: extremities normal, atraumatic, no cyanosis or edema. Left upper extremity AVF. Skin: Warm and dry No rashes or lesions Neurologic: Alert and oriented X 3, normal strength and tone. Normal coordination and gait  Assessment/Plan Chest pain r/o MI Abnormal Troponin-I CAD Dilated cardiomyopathy ESRD S/P GB surgery Abdominal pain Hypertension Anemia of chronic disease Obesity DM, II  Cardiac cath in AM.  Birdie Riddle, MD  03/15/2016, 8:20 PM

## 2016-03-15 NOTE — Consult Note (Signed)
Reason for Consult:abdominal pain Referring Physician: C. Horton  [name] is an 55 y.o. male.  HPI: [name] is status post laparoscopic lysis of adhesions and laparoscopic cholecystectomy on 9/21 by Dr. Zella Richer. For several days, he has been having right sided abdominal pain. He is also had diarrhea. He came to the emergency department for further evaluation. He underwent CT scan of the abdomen and pelvis and this shows a small amount of fluid in his gallbladder fossa with some inflammatory changes. I was asked to see him from a surgical standpoint. Of note, he has multiple medical problems including CHF and end-stage renal disease. He dialyzes Monday, Wednesday, and Fridays.  Past Medical History:  Diagnosis Date  . Atrial flutter ([place])   . 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 Cardiomyopathy   . CHF (congestive heart failure) ([place])   . Coronary artery disease    normal coronaries by 10/10/08 cath, Cardiac Cath 08-04-12 epic.Dr. Doylene Canard follows  . Diabetes mellitus    pt. states he's borderline diabetic., no longer taking med,- off med. since 2013  . Dialysis patient [facility])    Mon-Wed-Fri([place])- Left AV fistula  . Diverticulitis November 2016   reoccurred in December 2016  . ESRD (end stage renal disease) ([place])   . GERD (gastroesophageal reflux disease)   . Gout   . History of nephrectomy 07/04/2012   History of right nephrectomy in 2002 for renal cell carcinoma   . History of unilateral nephrectomy   . Hypertension   . Low iron   . Myocardial infarction ?2006  . Renal cell carcinoma    dialysis- MWF- Dr. Mercy Moore follows.  . Renal insufficiency   . Shortness of breath 05/19/11   "at rest, lying down, w/exertion"  . Stroke [name]) 02/2011   05/19/11 denies residual  . Umbilical hernia 32/20/25   unrepaired    Past Surgical History:  Procedure Laterality Date  . AV FISTULA PLACEMENT  03/29/2011   Procedure: ARTERIOVENOUS (AV) FISTULA  CREATION;  Surgeon: Hinda Lenis, MD;  Location: [place];  Service: Vascular;  Laterality: Left;  LEFT RADIOCEPHALIC , Arteriovenous [id number])  . CARDIAC CATHETERIZATION  05/21/11  . CHOLECYSTECTOMY N/A 02/12/2016   Procedure: LAPAROSCOPIC CHOLECYSTECTOMY WITH INTRAOPERATIVE CHOLANGIOGRAM;  Surgeon: Jackolyn Confer, MD;  Location: [place];  Service: General;  Laterality: N/A;  . dialysis cath placed    . ESOPHAGOGASTRODUODENOSCOPY (EGD) WITH PROPOFOL N/A 12/02/2015   Procedure: ESOPHAGOGASTRODUODENOSCOPY (EGD) WITH PROPOFOL;  Surgeon: Juanita Craver, MD;  Location: [facility];  Service: Endoscopy;  Laterality: N/A;  . FINGER SURGERY     L pinkie finger- ORIF- /w remaining hardware - 1990's    . HERNIA REPAIR N/A 1/51/7616   Umbilical hernia repair  . INSERTION OF DIALYSIS CATHETER N/A 01/27/2016   Procedure: INSERTION OF DIALYSIS CATHETER;  Surgeon: Waynetta Sandy, MD;  Location: [place];  Service: Vascular;  Laterality: N/A;  . INSERTION OF MESH N/A 07/04/2012   Procedure: INSERTION OF MESH;  Surgeon: Madilyn Hook, DO;  Location: [place];  Service: General;  Laterality: N/A;  . LAPAROSCOPIC LYSIS OF ADHESIONS N/A 02/12/2016   Procedure: LAPAROSCOPIC LYSIS OF ADHESIONS;  Surgeon: Jackolyn Confer, MD;  Location: [place];  Service: General;  Laterality: N/A;  . LEFT AND RIGHT HEART CATHETERIZATION WITH CORONARY ANGIOGRAM N/A 08/04/2012   Procedure: LEFT AND RIGHT HEART CATHETERIZATION WITH CORONARY ANGIOGRAM;  Surgeon: Birdie Riddle, MD;  Location: [place] CATH LAB;  Service: Cardiovascular;  Laterality: N/A;  . NEPHRECTOMY  2000   right  . REVISON OF ARTERIOVENOUS FISTULA Left 06/05/2013   Procedure: REVISON OF LEFT RADIOCEPHALIC  ARTERIOVENOUS FISTULA;  Surgeon: Conrad Lodgepole, MD;  Location: [place];  Service: Vascular;  Laterality: Left;  . REVISON OF ARTERIOVENOUS FISTULA Left 01/27/2016   Procedure: REVISION OF LEFT UPPER EXTREMITY ARTERIOVENOUS FISTULA;  Surgeon: Waynetta Sandy, MD;   Location: [place];  Service: Vascular;  Laterality: Left;  . RIGHT HEART CATHETERIZATION N/A 05/21/2011   Procedure: RIGHT HEART CATH;  Surgeon: Birdie Riddle, MD;  Location: [facility] CATH LAB;  Service: Cardiovascular;  Laterality: N/A;  . smashed  1990's   "left pinky; have a plate in there"  . UMBILICAL HERNIA REPAIR N/A 07/04/2012   Procedure: LAPAROSCOPIC UMBILICAL HERNIA;  Surgeon: Madilyn Hook, DO;  Location: [place];  Service: General;  Laterality: N/A;  . US ECHOCARDIOGRAPHY  05/20/11    Family History  Problem Relation Age of Onset  . Hypertension Mother   . Kidney disease Mother     Social History:  reports that he has never smoked. He has never used smokeless tobacco. He reports that he does not drink alcohol or use drugs.  Allergies:  Allergies  Allergen Reactions  . Ace Inhibitors Itching and Cough    Medications: Scheduled: Continuous: PRN:  Results for orders placed or performed during the hospital encounter of 03/14/16 (from the past 48 hour(s))  CBC with Differential/Platelet     Status: Abnormal   Collection Time: 03/14/16 10:14 PM  Result Value Ref Range   WBC 4.7 4.0 - 10.5 K/uL   RBC 3.59 (L) 4.22 - 5.81 MIL/uL   Hemoglobin 11.3 (L) 13.0 - 17.0 g/dL   HCT 34.6 (L) 39.0 - 52.0 %   MCV 96.4 78.0 - 100.0 fL   MCH 31.5 26.0 - 34.0 pg   MCHC 32.7 30.0 - 36.0 g/dL   RDW 15.0 11.5 - 15.5 %   Platelets 144 (L) 150 - 400 K/uL   Neutrophils Relative % 61 %   Neutro Abs 2.9 1.7 - 7.7 K/uL   Lymphocytes Relative 26 %   Lymphs Abs 1.2 0.7 - 4.0 K/uL   Monocytes Relative 9 %   Monocytes Absolute 0.4 0.1 - 1.0 K/uL   Eosinophils Relative 4 %   Eosinophils Absolute 0.2 0.0 - 0.7 K/uL   Basophils Relative 0 %   Basophils Absolute 0.0 0.0 - 0.1 K/uL  Comprehensive metabolic panel     Status: Abnormal   Collection Time: 03/14/16 10:14 PM  Result Value Ref Range   Sodium 136 135 - 145 mmol/L   Potassium 4.6 3.5 - 5.1 mmol/L   Chloride 100 (L) 101 - 111 mmol/L   CO2 25  22 - 32 mmol/L   Glucose, Bld 98 65 - 99 mg/dL   BUN 50 (H) 6 - 20 mg/dL   Creatinine, Ser 13.40 (H) 0.61 - 1.24 mg/dL   Calcium 8.1 (L) 8.9 - 10.3 mg/dL   Total Protein 7.3 6.5 - 8.1 g/dL   Albumin 3.4 (L) 3.5 - 5.0 g/dL   AST 13 (L) 15 - 41 U/L   ALT 12 (L) 17 - 63 U/L   Alkaline Phosphatase 101 38 - 126 U/L   Total Bilirubin 0.9 0.3 - 1.2 mg/dL   GFR calc non Af Amer 4 (L) >60 mL/min   GFR calc Af Amer 4 (L) >60 mL/min    Comment: (NOTE) The eGFR has been calculated using  the CKD EPI equation. This calculation has not been validated in all clinical situations. eGFR's persistently <60 mL/min signify possible Chronic Kidney Disease.    Anion gap 11 5 - 15  Lipase, blood     Status: Abnormal   Collection Time: 03/14/16 10:14 PM  Result Value Ref Range   Lipase 125 (H) 11 - 51 U/L  Troponin I     Status: Abnormal   Collection Time: 03/14/16 10:14 PM  Result Value Ref Range   Troponin I 0.22 (HH) <0.03 ng/mL    Comment: CRITICAL RESULT CALLED TO, READ BACK BY AND VERIFIED WITH: OLDLAND,B RN 03/14/2016 [id number] JORDANS   Protime-INR     Status: None   Collection Time: 03/14/16 10:14 PM  Result Value Ref Range   Prothrombin Time 13.6 11.4 - 15.2 seconds   INR 1.04   Troponin I     Status: Abnormal   Collection Time: 03/15/16  1:19 AM  Result Value Ref Range   Troponin I 0.21 (HH) <0.03 ng/mL    Comment: CRITICAL VALUE NOTED.  VALUE IS CONSISTENT WITH PREVIOUSLY REPORTED AND CALLED VALUE.    Dg Chest 2 View  Result Date: 03/14/2016 CLINICAL DATA:  Shortness of breath EXAM: CHEST  2 VIEW COMPARISON:  None. FINDINGS: Slight elevation of the left diaphragm. There is mild to moderate enlargement of the cardiomediastinal silhouette. There is mild central vascular congestion. No pleural effusion or focal consolidation. Mild atherosclerosis of the aorta. No pneumothorax. IMPRESSION: Stable cardiomegaly with mild central vascular congestion. No overt edema, consolidation or effusion.  Atherosclerotic vascular disease of the aorta. Electronically Signed   By: Donavan Foil M.D.   On: 03/14/2016 23:41   Dg Chest 2 View  Result Date: 03/14/2016 CLINICAL DATA:  Congestion for 5 days with productive cough, vomiting, nausea, right scapular pain, and right rib pain. EXAM: CHEST  2 VIEW COMPARISON:  09/07/2015 FINDINGS: Enlargement of the cardiac silhouette is unchanged. There is mild prominence of the central pulmonary arteries with decreased pulmonary vascular congestion compared to the prior study. There is no evidence confluent airspace opacity, edema, pleural effusion, or pneumothorax. No acute osseous abnormality is identified. IMPRESSION: Cardiomegaly with decreased pulmonary vascular congestion. No evidence of edema or pneumonia. Electronically Signed   By: Logan Bores M.D.   On: 03/14/2016 18:53   Ct Angio Chest Pe W And/or Wo Contrast  Result Date: 03/15/2016 CLINICAL DATA:  55 year old male with back pain and pleuritic chest pain. EXAM: CT ANGIOGRAPHY CHEST CT ABDOMEN AND PELVIS WITH CONTRAST TECHNIQUE: Multidetector CT imaging of the chest was performed using the standard protocol during bolus administration of intravenous contrast. Multiplanar CT image reconstructions and MIPs were obtained to evaluate the vascular anatomy. Multidetector CT imaging of the abdomen and pelvis was performed using the standard protocol during bolus administration of intravenous contrast. CONTRAST:  80 cc Isovue 370 COMPARISON:  CT of the abdomen pelvis dated 09/07/2015 FINDINGS: CTA CHEST FINDINGS Cardiovascular: The thoracic aorta appears unremarkable. There is no aneurysmal dilatation or evidence of dissection. The origins of the great vessels of the aortic arch appear patent. There is non opacification of the left subclavian artery, likely related to timing of the contrast. There is mild dilatation of the main pulmonary trunk which may be represent a degree of pulmonary hypertension. Evaluation of  the pulmonary arteries is limited due to respiratory motion artifact as well as streak artifact caused by patient's arms. No definite large central pulmonary artery embolus identified. There is moderate cardiomegaly. Multi  vessel coronary vascular disease. No pericardial effusion. Mediastinum/Nodes: There is no hilar or mediastinal adenopathy. The esophagus and the thyroid gland are grossly unremarkable. Lungs/Pleura: The lungs are clear. There is no pleural effusion or pneumothorax. The central airways are patent. Musculoskeletal: Degenerative changes of the spine. No acute fracture. Review of the MIP images confirms the above findings. CT ABDOMEN and PELVIS FINDINGS No intra-abdominal free air.  Small subhepatic free fluid. Hepatobiliary: A 9 mm left hepatic hypodense lesion is not well characterized but appears similar to prior CT and may represent a cyst or hemangioma. The liver is otherwise unremarkable. Mild intrahepatic biliary ductal dilatation. There is postsurgical changes of cholecystectomy. There is a small amount of fluid as well as inflammatory changes of the cholecystectomy bed. Although these may be postsurgical, findings are concerning for an infectious process or bile leak 1 month postoperative. Correlation with clinical exam recommended. A hepatobiliary scintigraphy may provide better evaluation and better assessment for possibility of bile leak. No discrete drainable fluid collection identified at this time. No retained CBD stone noted. Pancreas: Unremarkable. No pancreatic ductal dilatation or surrounding inflammatory changes. Spleen: Normal in size without focal abnormality. Adrenals/Urinary Tract: The adrenal glands appear unremarkable. Status post prior right nephrectomy. There is marked left renal atrophy. Innumerable left renal hypodense lesions are not well characterized on this CT but may represent cysts. MRI or ultrasound may provide better characterization. This findings is similar to  prior CT. There is no hydronephrosis on the left. No stone identified. The visualized ureter appears unremarkable. The urinary bladder is collapsed. Stomach/Bowel: There is sigmoid diverticulosis without active inflammatory changes. Scattered colonic diverticula in the proximal transverse as well as in the ascending colon noted. Mild haziness of the diverticula at the hepatic flexure, likely secondary to extension of inflammatory changes of the cholecystectomy. Diverticulitis is less likely. There is no evidence of bowel obstruction. Normal appendix. Vascular/Lymphatic: Mild aortoiliac atherosclerotic disease. There is no aneurysmal dilatation or evidence of dissection. The origins of the celiac axis, SMA, IMA appear patent. The IVC appears patent. No portal venous gas identified. The SMV, splenic vein, and main portal vein are patent. There is no adenopathy. Reproductive: The prostate and seminal vesicles are grossly unremarkable. Other: Small fat containing umbilical hernia. Right anterior abdominal wall hernia repair mesh. Musculoskeletal: Degenerative changes of the spine. No acute fracture. Review of the MIP images confirms the above findings. IMPRESSION: No CT evidence of aortic dissection or central pulmonary artery embolus. Inflammatory changes of the cholecystectomy with small amount of fluid in the subhepatic area concerning for an infectious process or bile leak. Correlation with clinical exam recommended. A hepatobiliary scintigraphy may provide better evaluation and assistant for possible chronic. No drainable fluid collection. Colonic diverticulosis without active inflammatory changes. No evidence of bowel obstruction. Normal appendix. Electronically Signed   By: Anner Crete M.D.   On: 03/15/2016 03:35   Ct Abdomen Pelvis W Contrast  Result Date: 03/15/2016 CLINICAL DATA:  55 year old male with back pain and pleuritic chest pain. EXAM: CT ANGIOGRAPHY CHEST CT ABDOMEN AND PELVIS WITH CONTRAST  TECHNIQUE: Multidetector CT imaging of the chest was performed using the standard protocol during bolus administration of intravenous contrast. Multiplanar CT image reconstructions and MIPs were obtained to evaluate the vascular anatomy. Multidetector CT imaging of the abdomen and pelvis was performed using the standard protocol during bolus administration of intravenous contrast. CONTRAST:  80 cc Isovue 370 COMPARISON:  CT of the abdomen pelvis dated 09/07/2015 FINDINGS: CTA CHEST FINDINGS Cardiovascular: The  thoracic aorta appears unremarkable. There is no aneurysmal dilatation or evidence of dissection. The origins of the great vessels of the aortic arch appear patent. There is non opacification of the left subclavian artery, likely related to timing of the contrast. There is mild dilatation of the main pulmonary trunk which may be represent a degree of pulmonary hypertension. Evaluation of the pulmonary arteries is limited due to respiratory motion artifact as well as streak artifact caused by patient's arms. No definite large central pulmonary artery embolus identified. There is moderate cardiomegaly. Multi vessel coronary vascular disease. No pericardial effusion. Mediastinum/Nodes: There is no hilar or mediastinal adenopathy. The esophagus and the thyroid gland are grossly unremarkable. Lungs/Pleura: The lungs are clear. There is no pleural effusion or pneumothorax. The central airways are patent. Musculoskeletal: Degenerative changes of the spine. No acute fracture. Review of the MIP images confirms the above findings. CT ABDOMEN and PELVIS FINDINGS No intra-abdominal free air.  Small subhepatic free fluid. Hepatobiliary: A 9 mm left hepatic hypodense lesion is not well characterized but appears similar to prior CT and may represent a cyst or hemangioma. The liver is otherwise unremarkable. Mild intrahepatic biliary ductal dilatation. There is postsurgical changes of cholecystectomy. There is a small amount  of fluid as well as inflammatory changes of the cholecystectomy bed. Although these may be postsurgical, findings are concerning for an infectious process or bile leak 1 month postoperative. Correlation with clinical exam recommended. A hepatobiliary scintigraphy may provide better evaluation and better assessment for possibility of bile leak. No discrete drainable fluid collection identified at this time. No retained CBD stone noted. Pancreas: Unremarkable. No pancreatic ductal dilatation or surrounding inflammatory changes. Spleen: Normal in size without focal abnormality. Adrenals/Urinary Tract: The adrenal glands appear unremarkable. Status post prior right nephrectomy. There is marked left renal atrophy. Innumerable left renal hypodense lesions are not well characterized on this CT but may represent cysts. MRI or ultrasound may provide better characterization. This findings is similar to prior CT. There is no hydronephrosis on the left. No stone identified. The visualized ureter appears unremarkable. The urinary bladder is collapsed. Stomach/Bowel: There is sigmoid diverticulosis without active inflammatory changes. Scattered colonic diverticula in the proximal transverse as well as in the ascending colon noted. Mild haziness of the diverticula at the hepatic flexure, likely secondary to extension of inflammatory changes of the cholecystectomy. Diverticulitis is less likely. There is no evidence of bowel obstruction. Normal appendix. Vascular/Lymphatic: Mild aortoiliac atherosclerotic disease. There is no aneurysmal dilatation or evidence of dissection. The origins of the celiac axis, SMA, IMA appear patent. The IVC appears patent. No portal venous gas identified. The SMV, splenic vein, and main portal vein are patent. There is no adenopathy. Reproductive: The prostate and seminal vesicles are grossly unremarkable. Other: Small fat containing umbilical hernia. Right anterior abdominal wall hernia repair mesh.  Musculoskeletal: Degenerative changes of the spine. No acute fracture. Review of the MIP images confirms the above findings. IMPRESSION: No CT evidence of aortic dissection or central pulmonary artery embolus. Inflammatory changes of the cholecystectomy with small amount of fluid in the subhepatic area concerning for an infectious process or bile leak. Correlation with clinical exam recommended. A hepatobiliary scintigraphy may provide better evaluation and assistant for possible chronic. No drainable fluid collection. Colonic diverticulosis without active inflammatory changes. No evidence of bowel obstruction. Normal appendix. Electronically Signed   By: Elgie Collard M.D.   On: 03/15/2016 03:35    Review of Systems  Constitutional: Negative for fever.  HENT:  Negative.   Eyes:       Blind in right eye  Respiratory: Negative for cough and shortness of breath.   Cardiovascular: Positive for chest pain.  Gastrointestinal: Positive for abdominal pain and diarrhea. Negative for constipation, nausea and vomiting.  Genitourinary:       ESRD  Musculoskeletal: Negative.   Skin: Negative.   Neurological: Negative.   Endo/Heme/Allergies: Negative.   Psychiatric/Behavioral: Negative.    Blood pressure 150/86, pulse 71, temperature 98.2 F (36.8 C), temperature source Oral, resp. rate 15, height '6\' 1"'$  (1.854 m), weight 119.7 kg (263 lb 14.4 oz), SpO2 95 %. Physical Exam  Constitutional: He is oriented to person, place, and time. He appears well-developed and well-nourished. No distress.  HENT:  Head: Normocephalic.  Right Ear: External ear normal.  Left Ear: External ear normal.  Mouth/Throat: Oropharynx is clear and moist.  Eyes: Right eye exhibits no discharge. Left eye exhibits no discharge.  Neck: Neck supple. No tracheal deviation present.  Cardiovascular: Normal rate and normal heart sounds.   Respiratory: Effort normal and breath sounds normal. No respiratory distress. He has no wheezes.   GI: Soft. He exhibits no distension. There is tenderness. There is no rebound and no guarding.  Tender along the right side of the abdomen without guarding or peritonitis, bowel sounds are normal  Musculoskeletal:  Fistula left upper extremity  Neurological: He is alert and oriented to person, place, and time. He exhibits normal muscle tone.  Skin: Skin is warm.  Psychiatric: He has a normal mood and affect.    Assessment/Plan: Right-sided abdominal pain, status post cholecystectomy 9/21. I have ordered a HIDA to see if there is a bile leak. NPO for now. Agree with medical admission so he may undergo his normal scheduled dialysis. We will follow-up.  Karessa Onorato E 03/15/2016, 4:09 AM

## 2016-03-15 NOTE — Progress Notes (Signed)
Progress Note    [name]  GDJ:[id number] DOB: June 28, 1960  DOA: 03/14/2016 PCP: Maggie Font, MD    Brief Narrative:   Chief complaint: F/U abdominal pain, troponin elevation  Multiple office records/hospital notes reviewed.   [name] is an 55 y.o. male with a PMH of ESRD secondary to HTN with solitary kidney on HD MWF, biliary dyskinesia s/p laparoscopic lysis of adhesions and cholecystectomy with cholangiogram 02/12/16, atrial flutter on chronic coumadin (although INR low on admission, raising question of compliance), DM II, Obesity, HCC s/p right nephrectomy, chronic systolic CHF with an EF of 20-25% (per Echo 06/2015) and dilated non-ischemic cardiomyopathy who was admitted 03/14/16 with chief complaint of right upper quadrant abdominal pain and lower central chest pain. In the ED, his troponin was elevated at 0.2 to and a CT scan of his abdomen showed fluid in the subhepatic area with inflammation concerning for infection versus bile leak.  Assessment/Plan:   Principal Problem:   RUQ pain rule out postoperative bile leak Dr. Grandville Silos consulting. HIDA scan planned for today for further evaluation. Lipase also elevated. Pain may be pleuritic given cough and pleuritic quality.  Active Problems:   Hypertension, benign Continue Norvasc, Coreg and Isordil.    DM II (diabetes mellitus, type II), controlled ([place]) Diet controlled.    End stage renal disease ([place]) Will need dialysis today. Discussed with Dr. Detterding. Continue Sensipar, Renvela.    Elevated troponin Spoke with the patient's cardiologist, Dr. Doylene Canard who will see the patient in consultation. Continue IV heparin for now.  Family Communication/Anticipated D/C date and plan/Code Status   DVT prophylaxis: Therapeutic dose heparin ordered. Code Status: Full Code.  Family Communication: No family at the bedside. Disposition Plan: Home when cleared by cardiologist.   Medical Consultants:    General  Surgery  Nephrology  Cardiology   Procedures:    None  Anti-Infectives:    None  Subjective:   ROS: The patient reports that He continues to have right-sided flank/back/chest pain. No current dyspnea, but he does have occasional cough. No nausea, vomiting, or diaphoretic spells.  Objective:    Vitals:   03/15/16 0400 03/15/16 0422 03/15/16 0430 03/15/16 0521  BP: 151/93  155/86 (!) 167/107  Pulse: 70 72 70 68  Resp:  15 14 17   Temp:    97.7 F (36.5 C)  TempSrc:    Oral  SpO2: 96% 98% 97% 100%  Weight:      Height:       No intake or output data in the 24 hours ending 03/15/16 0852 Filed Weights   03/14/16 2014  Weight: 119.7 kg (263 lb 14.4 oz)    Exam: General exam: Appears calm and comfortable.  Respiratory system: A few crackles in the left base. Respiratory effort normal. Cardiovascular system: S1 & S2 heard, RRR. No JVD,  rubs, gallops or clicks. II/VI murmur. Gastrointestinal system: Abdomen is nondistended, soft and nontender. No organomegaly or masses felt. Normal bowel sounds heard. Central nervous system: Alert and oriented. No focal neurological deficits. Extremities: No clubbing,  or cyanosis. No edema. LUE AVF with + thrill/bruit. Skin: No rashes, lesions or ulcers. Psychiatry: Judgement and insight appear normal. Mood & affect appropriate.   Data Reviewed:   I have personally reviewed following labs and imaging studies:  Labs: Basic Metabolic Panel:  Recent Labs Lab 03/14/16 2214  NA 136  K 4.6  CL 100*  CO2 25  GLUCOSE 98  BUN 50*  CREATININE 13.40*  CALCIUM 8.1*   GFR Estimated Creatinine Clearance: 8.7 mL/min (by C-G formula based on SCr of 13.4 mg/dL (H)). Liver Function Tests:  Recent Labs Lab 03/14/16 2214  AST 13*  ALT 12*  ALKPHOS 101  BILITOT 0.9  PROT 7.3  ALBUMIN 3.4*    Recent Labs Lab 03/14/16 2214  LIPASE 125*   No results for input(s): AMMONIA in the last 168 hours. Coagulation profile  Recent  Labs Lab 03/14/16 2214  INR 1.04    CBC:  Recent Labs Lab 03/14/16 2214  WBC 4.7  NEUTROABS 2.9  HGB 11.3*  HCT 34.6*  MCV 96.4  PLT 144*   Cardiac Enzymes:  Recent Labs Lab 03/14/16 2214 03/15/16 0119 03/15/16 0437  TROPONINI 0.22* 0.21* 0.22*    Microbiology No results found for this or any previous visit (from the past 240 hour(s)).  Radiology: Dg Chest 2 View  Result Date: 03/14/2016 CLINICAL DATA:  Shortness of breath EXAM: CHEST  2 VIEW COMPARISON:  None. FINDINGS: Slight elevation of the left diaphragm. There is mild to moderate enlargement of the cardiomediastinal silhouette. There is mild central vascular congestion. No pleural effusion or focal consolidation. Mild atherosclerosis of the aorta. No pneumothorax. IMPRESSION: Stable cardiomegaly with mild central vascular congestion. No overt edema, consolidation or effusion. Atherosclerotic vascular disease of the aorta. Electronically Signed   By: Donavan Foil M.D.   On: 03/14/2016 23:41   Dg Chest 2 View  Result Date: 03/14/2016 CLINICAL DATA:  Congestion for 5 days with productive cough, vomiting, nausea, right scapular pain, and right rib pain. EXAM: CHEST  2 VIEW COMPARISON:  09/07/2015 FINDINGS: Enlargement of the cardiac silhouette is unchanged. There is mild prominence of the central pulmonary arteries with decreased pulmonary vascular congestion compared to the prior study. There is no evidence confluent airspace opacity, edema, pleural effusion, or pneumothorax. No acute osseous abnormality is identified. IMPRESSION: Cardiomegaly with decreased pulmonary vascular congestion. No evidence of edema or pneumonia. Electronically Signed   By: Logan Bores M.D.   On: 03/14/2016 18:53   Ct Angio Chest Pe W And/or Wo Contrast  Result Date: 03/15/2016 CLINICAL DATA:  55 year old male with back pain and pleuritic chest pain. EXAM: CT ANGIOGRAPHY CHEST CT ABDOMEN AND PELVIS WITH CONTRAST TECHNIQUE: Multidetector CT  imaging of the chest was performed using the standard protocol during bolus administration of intravenous contrast. Multiplanar CT image reconstructions and MIPs were obtained to evaluate the vascular anatomy. Multidetector CT imaging of the abdomen and pelvis was performed using the standard protocol during bolus administration of intravenous contrast. CONTRAST:  80 cc Isovue 370 COMPARISON:  CT of the abdomen pelvis dated 09/07/2015 FINDINGS: CTA CHEST FINDINGS Cardiovascular: The thoracic aorta appears unremarkable. There is no aneurysmal dilatation or evidence of dissection. The origins of the great vessels of the aortic arch appear patent. There is non opacification of the left subclavian artery, likely related to timing of the contrast. There is mild dilatation of the main pulmonary trunk which may be represent a degree of pulmonary hypertension. Evaluation of the pulmonary arteries is limited due to respiratory motion artifact as well as streak artifact caused by patient's arms. No definite large central pulmonary artery embolus identified. There is moderate cardiomegaly. Multi vessel coronary vascular disease. No pericardial effusion. Mediastinum/Nodes: There is no hilar or mediastinal adenopathy. The esophagus and the thyroid gland are grossly unremarkable. Lungs/Pleura: The lungs are clear. There is no pleural effusion or pneumothorax. The central airways are patent. Musculoskeletal: Degenerative changes of the spine.  No acute fracture. Review of the MIP images confirms the above findings. CT ABDOMEN and PELVIS FINDINGS No intra-abdominal free air.  Small subhepatic free fluid. Hepatobiliary: A 9 mm left hepatic hypodense lesion is not well characterized but appears similar to prior CT and may represent a cyst or hemangioma. The liver is otherwise unremarkable. Mild intrahepatic biliary ductal dilatation. There is postsurgical changes of cholecystectomy. There is a small amount of fluid as well as  inflammatory changes of the cholecystectomy bed. Although these may be postsurgical, findings are concerning for an infectious process or bile leak 1 month postoperative. Correlation with clinical exam recommended. A hepatobiliary scintigraphy may provide better evaluation and better assessment for possibility of bile leak. No discrete drainable fluid collection identified at this time. No retained CBD stone noted. Pancreas: Unremarkable. No pancreatic ductal dilatation or surrounding inflammatory changes. Spleen: Normal in size without focal abnormality. Adrenals/Urinary Tract: The adrenal glands appear unremarkable. Status post prior right nephrectomy. There is marked left renal atrophy. Innumerable left renal hypodense lesions are not well characterized on this CT but may represent cysts. MRI or ultrasound may provide better characterization. This findings is similar to prior CT. There is no hydronephrosis on the left. No stone identified. The visualized ureter appears unremarkable. The urinary bladder is collapsed. Stomach/Bowel: There is sigmoid diverticulosis without active inflammatory changes. Scattered colonic diverticula in the proximal transverse as well as in the ascending colon noted. Mild haziness of the diverticula at the hepatic flexure, likely secondary to extension of inflammatory changes of the cholecystectomy. Diverticulitis is less likely. There is no evidence of bowel obstruction. Normal appendix. Vascular/Lymphatic: Mild aortoiliac atherosclerotic disease. There is no aneurysmal dilatation or evidence of dissection. The origins of the celiac axis, SMA, IMA appear patent. The IVC appears patent. No portal venous gas identified. The SMV, splenic vein, and main portal vein are patent. There is no adenopathy. Reproductive: The prostate and seminal vesicles are grossly unremarkable. Other: Small fat containing umbilical hernia. Right anterior abdominal wall hernia repair mesh. Musculoskeletal:  Degenerative changes of the spine. No acute fracture. Review of the MIP images confirms the above findings. IMPRESSION: No CT evidence of aortic dissection or central pulmonary artery embolus. Inflammatory changes of the cholecystectomy with small amount of fluid in the subhepatic area concerning for an infectious process or bile leak. Correlation with clinical exam recommended. A hepatobiliary scintigraphy may provide better evaluation and assistant for possible chronic. No drainable fluid collection. Colonic diverticulosis without active inflammatory changes. No evidence of bowel obstruction. Normal appendix. Electronically Signed   By: Anner Crete M.D.   On: 03/15/2016 03:35   Ct Abdomen Pelvis W Contrast  Result Date: 03/15/2016 CLINICAL DATA:  55 year old male with back pain and pleuritic chest pain. EXAM: CT ANGIOGRAPHY CHEST CT ABDOMEN AND PELVIS WITH CONTRAST TECHNIQUE: Multidetector CT imaging of the chest was performed using the standard protocol during bolus administration of intravenous contrast. Multiplanar CT image reconstructions and MIPs were obtained to evaluate the vascular anatomy. Multidetector CT imaging of the abdomen and pelvis was performed using the standard protocol during bolus administration of intravenous contrast. CONTRAST:  80 cc Isovue 370 COMPARISON:  CT of the abdomen pelvis dated 09/07/2015 FINDINGS: CTA CHEST FINDINGS Cardiovascular: The thoracic aorta appears unremarkable. There is no aneurysmal dilatation or evidence of dissection. The origins of the great vessels of the aortic arch appear patent. There is non opacification of the left subclavian artery, likely related to timing of the contrast. There is mild dilatation of the  main pulmonary trunk which may be represent a degree of pulmonary hypertension. Evaluation of the pulmonary arteries is limited due to respiratory motion artifact as well as streak artifact caused by patient's arms. No definite large central  pulmonary artery embolus identified. There is moderate cardiomegaly. Multi vessel coronary vascular disease. No pericardial effusion. Mediastinum/Nodes: There is no hilar or mediastinal adenopathy. The esophagus and the thyroid gland are grossly unremarkable. Lungs/Pleura: The lungs are clear. There is no pleural effusion or pneumothorax. The central airways are patent. Musculoskeletal: Degenerative changes of the spine. No acute fracture. Review of the MIP images confirms the above findings. CT ABDOMEN and PELVIS FINDINGS No intra-abdominal free air.  Small subhepatic free fluid. Hepatobiliary: A 9 mm left hepatic hypodense lesion is not well characterized but appears similar to prior CT and may represent a cyst or hemangioma. The liver is otherwise unremarkable. Mild intrahepatic biliary ductal dilatation. There is postsurgical changes of cholecystectomy. There is a small amount of fluid as well as inflammatory changes of the cholecystectomy bed. Although these may be postsurgical, findings are concerning for an infectious process or bile leak 1 month postoperative. Correlation with clinical exam recommended. A hepatobiliary scintigraphy may provide better evaluation and better assessment for possibility of bile leak. No discrete drainable fluid collection identified at this time. No retained CBD stone noted. Pancreas: Unremarkable. No pancreatic ductal dilatation or surrounding inflammatory changes. Spleen: Normal in size without focal abnormality. Adrenals/Urinary Tract: The adrenal glands appear unremarkable. Status post prior right nephrectomy. There is marked left renal atrophy. Innumerable left renal hypodense lesions are not well characterized on this CT but may represent cysts. MRI or ultrasound may provide better characterization. This findings is similar to prior CT. There is no hydronephrosis on the left. No stone identified. The visualized ureter appears unremarkable. The urinary bladder is collapsed.  Stomach/Bowel: There is sigmoid diverticulosis without active inflammatory changes. Scattered colonic diverticula in the proximal transverse as well as in the ascending colon noted. Mild haziness of the diverticula at the hepatic flexure, likely secondary to extension of inflammatory changes of the cholecystectomy. Diverticulitis is less likely. There is no evidence of bowel obstruction. Normal appendix. Vascular/Lymphatic: Mild aortoiliac atherosclerotic disease. There is no aneurysmal dilatation or evidence of dissection. The origins of the celiac axis, SMA, IMA appear patent. The IVC appears patent. No portal venous gas identified. The SMV, splenic vein, and main portal vein are patent. There is no adenopathy. Reproductive: The prostate and seminal vesicles are grossly unremarkable. Other: Small fat containing umbilical hernia. Right anterior abdominal wall hernia repair mesh. Musculoskeletal: Degenerative changes of the spine. No acute fracture. Review of the MIP images confirms the above findings. IMPRESSION: No CT evidence of aortic dissection or central pulmonary artery embolus. Inflammatory changes of the cholecystectomy with small amount of fluid in the subhepatic area concerning for an infectious process or bile leak. Correlation with clinical exam recommended. A hepatobiliary scintigraphy may provide better evaluation and assistant for possible chronic. No drainable fluid collection. Colonic diverticulosis without active inflammatory changes. No evidence of bowel obstruction. Normal appendix. Electronically Signed   By: Anner Crete M.D.   On: 03/15/2016 03:35    Medications:   . allopurinol  150 mg Oral Daily  . amiodarone  100 mg Oral Daily  . amLODipine  5 mg Oral Daily  . carvedilol  12.5 mg Oral Once per day on Mon Wed Fri  . [START ON 03/16/2016] carvedilol  12.5 mg Oral 2 times per day on Sun Tue  Thu Sat  . cinacalcet  90 mg Oral Q breakfast  . isosorbide dinitrate  20 mg Oral Daily    . pantoprazole  40 mg Oral BID  . sevelamer carbonate  2,400 mg Oral TID WC  . sodium chloride flush  3 mL Intravenous Q12H   Continuous Infusions: . heparin 1,500 Units/hr (03/15/16 0527)    No charge, follow-up visit on the date of admission.    LOS: 0 days   RAMA,CHRISTINA  [facility] Pager [phone]. If unable to reach me by pager, please call my cell phone at [phone].  *Please refer to [email or web address], password [email or web address] to get updated schedule on who will round on this patient, as hospitalists switch teams weekly. If 7PM-7AM, please contact night-coverage at [email or web address], password [email or web address] for any overnight needs.  03/15/2016, 8:52 AM

## 2016-03-15 NOTE — ED Notes (Signed)
Pt ambulated to BR with even, steady gait.

## 2016-03-15 NOTE — Care Management Obs Status (Signed)
[facility] NOTIFICATION   Patient Details  Name: [name] MRN: [id number] Date of Birth: 08/11/1960   Medicare Observation Status Notification Given:  Yes    Carles Collet, RN 03/15/2016, 11:42 AM

## 2016-03-15 NOTE — H&P (Signed)
History and Physical    [name] GMW:[id number] DOB: [birth year] DOA: 03/14/2016   PCP: Maggie Font, MD Chief Complaint:  Chief Complaint  Patient presents with  . Cough  . Chest Pain    HPI: [name] is a 55 y.o. male with medical history significant of ESRD dialysis MWF, cardiomyopathy, CAD with prior MI and PCI, borderline DM, HTN.  Patient is about 1 month post op of lap cholecystectomy.  Patient presents to the ED for several day history of RUQ abdominal pain and lower central chest pain.  Central chest pain is worse with deep breaths.  He has no fevers, chills.  Nothing makes abd pain better or worse.  Symptoms are persistent.  With regards to cardiac history, I see that the patient had elevated troponins in Aug of 2016, followed by a stress test on 01/16/15 that was "high risk" with EF 23%.  The patient states that after this he thinks he had a heart cath "around thanksgiving done here at [facility] by Dr. Doylene Canard", he states that they "went in through my groin, didn't put a stent into my heart but cleaned the arteries out".  However, I do not see where a heart cath was performed after this high risk stress test.  ED Course: Patient has 2 things of concern going on: Trop of 0.22, repeat 0.21.  CT shows fluid in subhepatic area with inflamation that is concerning for infection vs bile leak.  LFTs are NL, patient has no SIRS.  Review of Systems: As per HPI otherwise 10 point review of systems negative.    Past Medical History:  Diagnosis Date  . Atrial flutter ([place])   . 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 Cardiomyopathy   . CHF (congestive heart failure) ([place])   . Coronary artery disease    normal coronaries by 10/10/08 cath, Cardiac Cath 08-04-12 epic.Dr. Doylene Canard follows  . Diabetes mellitus    pt. states he's borderline diabetic., no longer taking med,- off med. since 2013  . Dialysis patient [facility])    Mon-Wed-Fri([place])- Left AV fistula   . Diverticulitis November 2016   reoccurred in December 2016  . ESRD (end stage renal disease) ([place])   . GERD (gastroesophageal reflux disease)   . Gout   . History of nephrectomy 07/04/2012   History of right nephrectomy in 2002 for renal cell carcinoma   . History of unilateral nephrectomy   . Hypertension   . Low iron   . Myocardial infarction ?2006  . Renal cell carcinoma    dialysis- MWF- Dr. Mercy Moore follows.  . Renal insufficiency   . Shortness of breath 05/19/11   "at rest, lying down, w/exertion"  . Stroke [facility]) 02/2011   05/19/11 denies residual  . Umbilical hernia 44/03/47   unrepaired    Past Surgical History:  Procedure Laterality Date  . AV FISTULA PLACEMENT  03/29/2011   Procedure: ARTERIOVENOUS (AV) FISTULA CREATION;  Surgeon: Hinda Lenis, MD;  Location: [place];  Service: Vascular;  Laterality: Left;  LEFT RADIOCEPHALIC , Arteriovenous [id number])  . CARDIAC CATHETERIZATION  05/21/11  . CHOLECYSTECTOMY N/A 02/12/2016   Procedure: LAPAROSCOPIC CHOLECYSTECTOMY WITH INTRAOPERATIVE CHOLANGIOGRAM;  Surgeon: Jackolyn Confer, MD;  Location: [place];  Service: General;  Laterality: N/A;  . dialysis cath placed    . ESOPHAGOGASTRODUODENOSCOPY (EGD) WITH PROPOFOL N/A 12/02/2015   Procedure: ESOPHAGOGASTRODUODENOSCOPY (EGD) WITH PROPOFOL;  Surgeon: Juanita Craver, MD;  Location: [facility];  Service:  Endoscopy;  Laterality: N/A;  . FINGER SURGERY     L pinkie finger- ORIF- /w remaining hardware - 1990's    . HERNIA REPAIR N/A 2/70/3500   Umbilical hernia repair  . INSERTION OF DIALYSIS CATHETER N/A 01/27/2016   Procedure: INSERTION OF DIALYSIS CATHETER;  Surgeon: Waynetta Sandy, MD;  Location: [place];  Service: Vascular;  Laterality: N/A;  . INSERTION OF MESH N/A 07/04/2012   Procedure: INSERTION OF MESH;  Surgeon: Madilyn Hook, DO;  Location: [place];  Service: General;  Laterality: N/A;  . LAPAROSCOPIC LYSIS OF ADHESIONS N/A 02/12/2016   Procedure:  LAPAROSCOPIC LYSIS OF ADHESIONS;  Surgeon: Jackolyn Confer, MD;  Location: [place];  Service: General;  Laterality: N/A;  . LEFT AND RIGHT HEART CATHETERIZATION WITH CORONARY ANGIOGRAM N/A 08/04/2012   Procedure: LEFT AND RIGHT HEART CATHETERIZATION WITH CORONARY ANGIOGRAM;  Surgeon: Birdie Riddle, MD;  Location: [place] CATH LAB;  Service: Cardiovascular;  Laterality: N/A;  . NEPHRECTOMY  2000   right  . REVISON OF ARTERIOVENOUS FISTULA Left 06/05/2013   Procedure: REVISON OF LEFT RADIOCEPHALIC  ARTERIOVENOUS FISTULA;  Surgeon: Conrad Noble, MD;  Location: [place];  Service: Vascular;  Laterality: Left;  . REVISON OF ARTERIOVENOUS FISTULA Left 01/27/2016   Procedure: REVISION OF LEFT UPPER EXTREMITY ARTERIOVENOUS FISTULA;  Surgeon: Waynetta Sandy, MD;  Location: [place];  Service: Vascular;  Laterality: Left;  . RIGHT HEART CATHETERIZATION N/A 05/21/2011   Procedure: RIGHT HEART CATH;  Surgeon: Birdie Riddle, MD;  Location: [facility] CATH LAB;  Service: Cardiovascular;  Laterality: N/A;  . smashed  1990's   "left pinky; have a plate in there"  . UMBILICAL HERNIA REPAIR N/A 07/04/2012   Procedure: LAPAROSCOPIC UMBILICAL HERNIA;  Surgeon: Madilyn Hook, DO;  Location: [place];  Service: General;  Laterality: N/A;  . US ECHOCARDIOGRAPHY  05/20/11     reports that he has never smoked. He has never used smokeless tobacco. He reports that he does not drink alcohol or use drugs.  Allergies  Allergen Reactions  . Ace Inhibitors Itching and Cough    Family History  Problem Relation Age of Onset  . Hypertension Mother   . Kidney disease Mother       Prior to Admission medications   Medication Sig Start Date End Date Taking? Authorizing Provider  allopurinol (ZYLOPRIM) 300 MG tablet Take 150 mg by mouth daily. 12/07/15  Yes Historical Provider, MD  amiodarone (PACERONE) 200 MG tablet Take 0.5 tablets (100 mg total) by mouth daily. 07/05/15  Yes Dixie Dials, MD  amLODipine (NORVASC) 5 MG tablet Take 5 mg  by mouth daily.    Yes Historical Provider, MD  carvedilol (COREG) 25 MG tablet Take 0.5 tablets (12.5 mg total) by mouth See admin instructions. Take 1/2 tablet (12.5 mg) by mouth in the evening on dialysis (Monday, Wednesday, Friday and 1/2 tablet (12.5 mg) twice daily on Tuesday, Thursday, Saturday, Sunday (non-dialysis days) 07/05/15  Yes Dixie Dials, MD  Cholecalciferol (VITAMIN D PO) Take 1 tablet by mouth daily.   Yes Historical Provider, MD  cinacalcet (SENSIPAR) 90 MG tablet Take 90 mg by mouth daily.    Yes Historical Provider, MD  isosorbide dinitrate (ISORDIL) 20 MG tablet Take 20 mg by mouth daily.    Yes Historical Provider, MD  lidocaine-prilocaine (EMLA) cream Apply 1 application topically every Monday, Wednesday, and Friday. Apply to port access prior to dialysis 04/08/15  Yes Historical Provider, MD  nitroGLYCERIN (NITROSTAT) 0.4 MG SL tablet Place  0.4 mg under the tongue every 5 (five) minutes as needed for chest pain.  05/20/15  Yes Historical Provider, MD  oxyCODONE-acetaminophen (PERCOCET/ROXICET) 5-325 MG tablet Take 1-2 tablets by mouth every 4 (four) hours as needed for moderate pain. 02/13/16  Yes Jackolyn Confer, MD  pantoprazole (PROTONIX) 40 MG tablet Take 1 tablet (40 mg total) by mouth 2 (two) times daily. 01/27/16  Yes Alvia Grove, PA-C  sevelamer carbonate (RENVELA) 800 MG tablet Take 3 tablets (2,400 mg total) by mouth 3 (three) times daily with meals. 07/05/15  Yes Dixie Dials, MD  warfarin (COUMADIN) 6 MG tablet Take 9 mg by mouth daily. 02/22/16  Yes Historical Provider, MD    Physical Exam: Vitals:   03/15/16 0345 03/15/16 0400 03/15/16 0422 03/15/16 0430  BP: 150/86 151/93  155/86  Pulse: 71 70 72 70  Resp: 15  15 14   Temp:      TempSrc:      SpO2: 95% 96% 98% 97%  Weight:      Height:          Constitutional: NAD, calm, comfortable Eyes: PERRL, lids and conjunctivae normal ENMT: Mucous membranes are moist. Posterior pharynx clear of any exudate  or lesions.Normal dentition.  Neck: normal, supple, no masses, no thyromegaly Respiratory: clear to auscultation bilaterally, no wheezing, no crackles. Normal respiratory effort. No accessory muscle use.  Cardiovascular: Regular rate and rhythm, no murmurs / rubs / gallops. No extremity edema. 2+ pedal pulses. No carotid bruits.  Abdomen: no tenderness, no masses palpated. No hepatosplenomegaly. Bowel sounds positive.  Musculoskeletal: no clubbing / cyanosis. No joint deformity upper and lower extremities. Good ROM, no contractures. Normal muscle tone.  Skin: no rashes, lesions, ulcers. No induration Neurologic: CN 2-12 grossly intact. Sensation intact, DTR normal. Strength 5/5 in all 4.  Psychiatric: Normal judgment and insight. Alert and oriented x 3. Normal mood.    Labs on Admission: I have personally reviewed following labs and imaging studies  CBC:  Recent Labs Lab 03/14/16 2214  WBC 4.7  NEUTROABS 2.9  HGB 11.3*  HCT 34.6*  MCV 96.4  PLT 025*   Basic Metabolic Panel:  Recent Labs Lab 03/14/16 2214  NA 136  K 4.6  CL 100*  CO2 25  GLUCOSE 98  BUN 50*  CREATININE 13.40*  CALCIUM 8.1*   GFR: Estimated Creatinine Clearance: 8.7 mL/min (by C-G formula based on SCr of 13.4 mg/dL (H)). Liver Function Tests:  Recent Labs Lab 03/14/16 2214  AST 13*  ALT 12*  ALKPHOS 101  BILITOT 0.9  PROT 7.3  ALBUMIN 3.4*    Recent Labs Lab 03/14/16 2214  LIPASE 125*   No results for input(s): AMMONIA in the last 168 hours. Coagulation Profile:  Recent Labs Lab 03/14/16 2214  INR 1.04   Cardiac Enzymes:  Recent Labs Lab 03/14/16 2214 03/15/16 0119  TROPONINI 0.22* 0.21*   BNP (last 3 results) No results for input(s): PROBNP in the last 8760 hours. HbA1C: No results for input(s): HGBA1C in the last 72 hours. CBG: No results for input(s): GLUCAP in the last 168 hours. Lipid Profile: No results for input(s): CHOL, HDL, LDLCALC, TRIG, CHOLHDL, LDLDIRECT in  the last 72 hours. Thyroid Function Tests: No results for input(s): TSH, T4TOTAL, FREET4, T3FREE, THYROIDAB in the last 72 hours. Anemia Panel: No results for input(s): VITAMINB12, FOLATE, FERRITIN, TIBC, IRON, RETICCTPCT in the last 72 hours. Urine analysis:    Component Value Date/Time   COLORURINE YELLOW 05/19/2011 1042  APPEARANCEUR CLEAR 05/19/2011 1042   LABSPEC 1.013 05/19/2011 1042   PHURINE 6.5 05/19/2011 1042   GLUCOSEU NEGATIVE 05/19/2011 1042   HGBUR TRACE (A) 05/19/2011 1042   BILIRUBINUR NEGATIVE 05/19/2011 1042   KETONESUR NEGATIVE 05/19/2011 1042   PROTEINUR >300 (A) 05/19/2011 1042   UROBILINOGEN 0.2 05/19/2011 1042   NITRITE NEGATIVE 05/19/2011 1042   LEUKOCYTESUR NEGATIVE 05/19/2011 1042   Sepsis Labs: @LABRCNTIP (procalcitonin:4,lacticidven:4) )No results found for this or any previous visit (from the past 240 hour(s)).   Radiological Exams on Admission: Dg Chest 2 View  Result Date: 03/14/2016 CLINICAL DATA:  Shortness of breath EXAM: CHEST  2 VIEW COMPARISON:  None. FINDINGS: Slight elevation of the left diaphragm. There is mild to moderate enlargement of the cardiomediastinal silhouette. There is mild central vascular congestion. No pleural effusion or focal consolidation. Mild atherosclerosis of the aorta. No pneumothorax. IMPRESSION: Stable cardiomegaly with mild central vascular congestion. No overt edema, consolidation or effusion. Atherosclerotic vascular disease of the aorta. Electronically Signed   By: Donavan Foil M.D.   On: 03/14/2016 23:41   Dg Chest 2 View  Result Date: 03/14/2016 CLINICAL DATA:  Congestion for 5 days with productive cough, vomiting, nausea, right scapular pain, and right rib pain. EXAM: CHEST  2 VIEW COMPARISON:  09/07/2015 FINDINGS: Enlargement of the cardiac silhouette is unchanged. There is mild prominence of the central pulmonary arteries with decreased pulmonary vascular congestion compared to the prior study. There is no  evidence confluent airspace opacity, edema, pleural effusion, or pneumothorax. No acute osseous abnormality is identified. IMPRESSION: Cardiomegaly with decreased pulmonary vascular congestion. No evidence of edema or pneumonia. Electronically Signed   By: Logan Bores M.D.   On: 03/14/2016 18:53   Ct Angio Chest Pe W And/or Wo Contrast  Result Date: 03/15/2016 CLINICAL DATA:  55 year old male with back pain and pleuritic chest pain. EXAM: CT ANGIOGRAPHY CHEST CT ABDOMEN AND PELVIS WITH CONTRAST TECHNIQUE: Multidetector CT imaging of the chest was performed using the standard protocol during bolus administration of intravenous contrast. Multiplanar CT image reconstructions and MIPs were obtained to evaluate the vascular anatomy. Multidetector CT imaging of the abdomen and pelvis was performed using the standard protocol during bolus administration of intravenous contrast. CONTRAST:  80 cc Isovue 370 COMPARISON:  CT of the abdomen pelvis dated 09/07/2015 FINDINGS: CTA CHEST FINDINGS Cardiovascular: The thoracic aorta appears unremarkable. There is no aneurysmal dilatation or evidence of dissection. The origins of the great vessels of the aortic arch appear patent. There is non opacification of the left subclavian artery, likely related to timing of the contrast. There is mild dilatation of the main pulmonary trunk which may be represent a degree of pulmonary hypertension. Evaluation of the pulmonary arteries is limited due to respiratory motion artifact as well as streak artifact caused by patient's arms. No definite large central pulmonary artery embolus identified. There is moderate cardiomegaly. Multi vessel coronary vascular disease. No pericardial effusion. Mediastinum/Nodes: There is no hilar or mediastinal adenopathy. The esophagus and the thyroid gland are grossly unremarkable. Lungs/Pleura: The lungs are clear. There is no pleural effusion or pneumothorax. The central airways are patent.  Musculoskeletal: Degenerative changes of the spine. No acute fracture. Review of the MIP images confirms the above findings. CT ABDOMEN and PELVIS FINDINGS No intra-abdominal free air.  Small subhepatic free fluid. Hepatobiliary: A 9 mm left hepatic hypodense lesion is not well characterized but appears similar to prior CT and may represent a cyst or hemangioma. The liver is otherwise unremarkable. Mild  intrahepatic biliary ductal dilatation. There is postsurgical changes of cholecystectomy. There is a small amount of fluid as well as inflammatory changes of the cholecystectomy bed. Although these may be postsurgical, findings are concerning for an infectious process or bile leak 1 month postoperative. Correlation with clinical exam recommended. A hepatobiliary scintigraphy may provide better evaluation and better assessment for possibility of bile leak. No discrete drainable fluid collection identified at this time. No retained CBD stone noted. Pancreas: Unremarkable. No pancreatic ductal dilatation or surrounding inflammatory changes. Spleen: Normal in size without focal abnormality. Adrenals/Urinary Tract: The adrenal glands appear unremarkable. Status post prior right nephrectomy. There is marked left renal atrophy. Innumerable left renal hypodense lesions are not well characterized on this CT but may represent cysts. MRI or ultrasound may provide better characterization. This findings is similar to prior CT. There is no hydronephrosis on the left. No stone identified. The visualized ureter appears unremarkable. The urinary bladder is collapsed. Stomach/Bowel: There is sigmoid diverticulosis without active inflammatory changes. Scattered colonic diverticula in the proximal transverse as well as in the ascending colon noted. Mild haziness of the diverticula at the hepatic flexure, likely secondary to extension of inflammatory changes of the cholecystectomy. Diverticulitis is less likely. There is no evidence of  bowel obstruction. Normal appendix. Vascular/Lymphatic: Mild aortoiliac atherosclerotic disease. There is no aneurysmal dilatation or evidence of dissection. The origins of the celiac axis, SMA, IMA appear patent. The IVC appears patent. No portal venous gas identified. The SMV, splenic vein, and main portal vein are patent. There is no adenopathy. Reproductive: The prostate and seminal vesicles are grossly unremarkable. Other: Small fat containing umbilical hernia. Right anterior abdominal wall hernia repair mesh. Musculoskeletal: Degenerative changes of the spine. No acute fracture. Review of the MIP images confirms the above findings. IMPRESSION: No CT evidence of aortic dissection or central pulmonary artery embolus. Inflammatory changes of the cholecystectomy with small amount of fluid in the subhepatic area concerning for an infectious process or bile leak. Correlation with clinical exam recommended. A hepatobiliary scintigraphy may provide better evaluation and assistant for possible chronic. No drainable fluid collection. Colonic diverticulosis without active inflammatory changes. No evidence of bowel obstruction. Normal appendix. Electronically Signed   By: Anner Crete M.D.   On: 03/15/2016 03:35   Ct Abdomen Pelvis W Contrast  Result Date: 03/15/2016 CLINICAL DATA:  55 year old male with back pain and pleuritic chest pain. EXAM: CT ANGIOGRAPHY CHEST CT ABDOMEN AND PELVIS WITH CONTRAST TECHNIQUE: Multidetector CT imaging of the chest was performed using the standard protocol during bolus administration of intravenous contrast. Multiplanar CT image reconstructions and MIPs were obtained to evaluate the vascular anatomy. Multidetector CT imaging of the abdomen and pelvis was performed using the standard protocol during bolus administration of intravenous contrast. CONTRAST:  80 cc Isovue 370 COMPARISON:  CT of the abdomen pelvis dated 09/07/2015 FINDINGS: CTA CHEST FINDINGS Cardiovascular: The  thoracic aorta appears unremarkable. There is no aneurysmal dilatation or evidence of dissection. The origins of the great vessels of the aortic arch appear patent. There is non opacification of the left subclavian artery, likely related to timing of the contrast. There is mild dilatation of the main pulmonary trunk which may be represent a degree of pulmonary hypertension. Evaluation of the pulmonary arteries is limited due to respiratory motion artifact as well as streak artifact caused by patient's arms. No definite large central pulmonary artery embolus identified. There is moderate cardiomegaly. Multi vessel coronary vascular disease. No pericardial effusion. Mediastinum/Nodes: There is no  hilar or mediastinal adenopathy. The esophagus and the thyroid gland are grossly unremarkable. Lungs/Pleura: The lungs are clear. There is no pleural effusion or pneumothorax. The central airways are patent. Musculoskeletal: Degenerative changes of the spine. No acute fracture. Review of the MIP images confirms the above findings. CT ABDOMEN and PELVIS FINDINGS No intra-abdominal free air.  Small subhepatic free fluid. Hepatobiliary: A 9 mm left hepatic hypodense lesion is not well characterized but appears similar to prior CT and may represent a cyst or hemangioma. The liver is otherwise unremarkable. Mild intrahepatic biliary ductal dilatation. There is postsurgical changes of cholecystectomy. There is a small amount of fluid as well as inflammatory changes of the cholecystectomy bed. Although these may be postsurgical, findings are concerning for an infectious process or bile leak 1 month postoperative. Correlation with clinical exam recommended. A hepatobiliary scintigraphy may provide better evaluation and better assessment for possibility of bile leak. No discrete drainable fluid collection identified at this time. No retained CBD stone noted. Pancreas: Unremarkable. No pancreatic ductal dilatation or surrounding  inflammatory changes. Spleen: Normal in size without focal abnormality. Adrenals/Urinary Tract: The adrenal glands appear unremarkable. Status post prior right nephrectomy. There is marked left renal atrophy. Innumerable left renal hypodense lesions are not well characterized on this CT but may represent cysts. MRI or ultrasound may provide better characterization. This findings is similar to prior CT. There is no hydronephrosis on the left. No stone identified. The visualized ureter appears unremarkable. The urinary bladder is collapsed. Stomach/Bowel: There is sigmoid diverticulosis without active inflammatory changes. Scattered colonic diverticula in the proximal transverse as well as in the ascending colon noted. Mild haziness of the diverticula at the hepatic flexure, likely secondary to extension of inflammatory changes of the cholecystectomy. Diverticulitis is less likely. There is no evidence of bowel obstruction. Normal appendix. Vascular/Lymphatic: Mild aortoiliac atherosclerotic disease. There is no aneurysmal dilatation or evidence of dissection. The origins of the celiac axis, SMA, IMA appear patent. The IVC appears patent. No portal venous gas identified. The SMV, splenic vein, and main portal vein are patent. There is no adenopathy. Reproductive: The prostate and seminal vesicles are grossly unremarkable. Other: Small fat containing umbilical hernia. Right anterior abdominal wall hernia repair mesh. Musculoskeletal: Degenerative changes of the spine. No acute fracture. Review of the MIP images confirms the above findings. IMPRESSION: No CT evidence of aortic dissection or central pulmonary artery embolus. Inflammatory changes of the cholecystectomy with small amount of fluid in the subhepatic area concerning for an infectious process or bile leak. Correlation with clinical exam recommended. A hepatobiliary scintigraphy may provide better evaluation and assistant for possible chronic. No drainable  fluid collection. Colonic diverticulosis without active inflammatory changes. No evidence of bowel obstruction. Normal appendix. Electronically Signed   By: Anner Crete M.D.   On: 03/15/2016 03:35    EKG: Independently reviewed.  Assessment/Plan Active Problems:   Hypertension, benign   DM II (diabetes mellitus, type II), controlled ([place])   End stage renal disease ([facility])   Bile leak, postoperative   Elevated troponin    1. Post op sub-hepatic fluid - concerning for bile leak although LFTs are normal 1. Per Dr. Grandville Silos (see his consult note), patient needs to be NPO and get a HIDA scan (have ordered this). 2. NPO 3. Will put on heparin gtt for easy reversability of anticoagulation.  Although he says he takes coumadin for A.Flutter, his INR today is 1.0. 2. Elevated troponin - 1. Heparin gtt 2. Serial trops 3. Tele  monitor 4. See also HPI: I dont see where he had a heart cath following the trop elevation in Aug of last year and high risk stress test also in Aug of last year.  Although his symptoms are atypical for ACS, probably want to discuss this with Dr. Doylene Canard.  Also it looks like his troponin dropped back to normal in April of last year. 3. DM2 - Diet controlled 4. HTN - continue home meds 5. ESRD - left message on consult line for routine dialysis   DVT prophylaxis: heparin gtt, patient says he is on coumadin but INR is 1.0 Code Status: Full Family Communication: No family in room Consults called: Dr. Grandville Silos consult note in chart, left message with nephrology consult line for dialysis Admission status: Admit to obs   Etta Quill DO [facility] Pager [phone] from 7PM-7AM  If 7AM-7PM, please contact the day physician for the patient [email or web address] Password [email or web address]  03/15/2016, 4:41 AM

## 2016-03-15 NOTE — ED Provider Notes (Signed)
Patient signed out pending imaging.  CT chest negative. CT abdomen concerning for possible bile leak versus infection. No infectious symptoms. No leukocytosis. He has had some abdominal pain. No signs of peritonitis. Discussed CT scan with Dr. Grandville Silos. States patient will need a HIDA scan.  Patient is also due for dialysis later today. Dr. Grandville Silos requesting admission to medicine given patient's ongoing problems including end-stage renal disease. Will discuss with the admitting team.  Results for orders placed or performed during the hospital encounter of 03/14/16  CBC with Differential/Platelet  Result Value Ref Range   WBC 4.7 4.0 - 10.5 K/uL   RBC 3.59 (L) 4.22 - 5.81 MIL/uL   Hemoglobin 11.3 (L) 13.0 - 17.0 g/dL   HCT 34.6 (L) 39.0 - 52.0 %   MCV 96.4 78.0 - 100.0 fL   MCH 31.5 26.0 - 34.0 pg   MCHC 32.7 30.0 - 36.0 g/dL   RDW 15.0 11.5 - 15.5 %   Platelets 144 (L) 150 - 400 K/uL   Neutrophils Relative % 61 %   Neutro Abs 2.9 1.7 - 7.7 K/uL   Lymphocytes Relative 26 %   Lymphs Abs 1.2 0.7 - 4.0 K/uL   Monocytes Relative 9 %   Monocytes Absolute 0.4 0.1 - 1.0 K/uL   Eosinophils Relative 4 %   Eosinophils Absolute 0.2 0.0 - 0.7 K/uL   Basophils Relative 0 %   Basophils Absolute 0.0 0.0 - 0.1 K/uL  Comprehensive metabolic panel  Result Value Ref Range   Sodium 136 135 - 145 mmol/L   Potassium 4.6 3.5 - 5.1 mmol/L   Chloride 100 (L) 101 - 111 mmol/L   CO2 25 22 - 32 mmol/L   Glucose, Bld 98 65 - 99 mg/dL   BUN 50 (H) 6 - 20 mg/dL   Creatinine, Ser 13.40 (H) 0.61 - 1.24 mg/dL   Calcium 8.1 (L) 8.9 - 10.3 mg/dL   Total Protein 7.3 6.5 - 8.1 g/dL   Albumin 3.4 (L) 3.5 - 5.0 g/dL   AST 13 (L) 15 - 41 U/L   ALT 12 (L) 17 - 63 U/L   Alkaline Phosphatase 101 38 - 126 U/L   Total Bilirubin 0.9 0.3 - 1.2 mg/dL   GFR calc non Af Amer 4 (L) >60 mL/min   GFR calc Af Amer 4 (L) >60 mL/min   Anion gap 11 5 - 15  Lipase, blood  Result Value Ref Range   Lipase 125 (H) 11 - 51 U/L   Troponin I  Result Value Ref Range   Troponin I 0.22 (HH) <0.03 ng/mL  Protime-INR  Result Value Ref Range   Prothrombin Time 13.6 11.4 - 15.2 seconds   INR 1.04   Troponin I  Result Value Ref Range   Troponin I 0.21 (HH) <0.03 ng/mL   Dg Chest 2 View  Result Date: 03/14/2016 CLINICAL DATA:  Shortness of breath EXAM: CHEST  2 VIEW COMPARISON:  None. FINDINGS: Slight elevation of the left diaphragm. There is mild to moderate enlargement of the cardiomediastinal silhouette. There is mild central vascular congestion. No pleural effusion or focal consolidation. Mild atherosclerosis of the aorta. No pneumothorax. IMPRESSION: Stable cardiomegaly with mild central vascular congestion. No overt edema, consolidation or effusion. Atherosclerotic vascular disease of the aorta. Electronically Signed   By: Donavan Foil M.D.   On: 03/14/2016 23:41   Dg Chest 2 View  Result Date: 03/14/2016 CLINICAL DATA:  Congestion for 5 days with productive cough, vomiting, nausea, right scapular  pain, and right rib pain. EXAM: CHEST  2 VIEW COMPARISON:  09/07/2015 FINDINGS: Enlargement of the cardiac silhouette is unchanged. There is mild prominence of the central pulmonary arteries with decreased pulmonary vascular congestion compared to the prior study. There is no evidence confluent airspace opacity, edema, pleural effusion, or pneumothorax. No acute osseous abnormality is identified. IMPRESSION: Cardiomegaly with decreased pulmonary vascular congestion. No evidence of edema or pneumonia. Electronically Signed   By: Logan Bores M.D.   On: 03/14/2016 18:53   Ct Angio Chest Pe W And/or Wo Contrast  Result Date: 03/15/2016 CLINICAL DATA:  55 year old male with back pain and pleuritic chest pain. EXAM: CT ANGIOGRAPHY CHEST CT ABDOMEN AND PELVIS WITH CONTRAST TECHNIQUE: Multidetector CT imaging of the chest was performed using the standard protocol during bolus administration of intravenous contrast. Multiplanar CT  image reconstructions and MIPs were obtained to evaluate the vascular anatomy. Multidetector CT imaging of the abdomen and pelvis was performed using the standard protocol during bolus administration of intravenous contrast. CONTRAST:  80 cc Isovue 370 COMPARISON:  CT of the abdomen pelvis dated 09/07/2015 FINDINGS: CTA CHEST FINDINGS Cardiovascular: The thoracic aorta appears unremarkable. There is no aneurysmal dilatation or evidence of dissection. The origins of the great vessels of the aortic arch appear patent. There is non opacification of the left subclavian artery, likely related to timing of the contrast. There is mild dilatation of the main pulmonary trunk which may be represent a degree of pulmonary hypertension. Evaluation of the pulmonary arteries is limited due to respiratory motion artifact as well as streak artifact caused by patient's arms. No definite large central pulmonary artery embolus identified. There is moderate cardiomegaly. Multi vessel coronary vascular disease. No pericardial effusion. Mediastinum/Nodes: There is no hilar or mediastinal adenopathy. The esophagus and the thyroid gland are grossly unremarkable. Lungs/Pleura: The lungs are clear. There is no pleural effusion or pneumothorax. The central airways are patent. Musculoskeletal: Degenerative changes of the spine. No acute fracture. Review of the MIP images confirms the above findings. CT ABDOMEN and PELVIS FINDINGS No intra-abdominal free air.  Small subhepatic free fluid. Hepatobiliary: A 9 mm left hepatic hypodense lesion is not well characterized but appears similar to prior CT and may represent a cyst or hemangioma. The liver is otherwise unremarkable. Mild intrahepatic biliary ductal dilatation. There is postsurgical changes of cholecystectomy. There is a small amount of fluid as well as inflammatory changes of the cholecystectomy bed. Although these may be postsurgical, findings are concerning for an infectious process or  bile leak 1 month postoperative. Correlation with clinical exam recommended. A hepatobiliary scintigraphy may provide better evaluation and better assessment for possibility of bile leak. No discrete drainable fluid collection identified at this time. No retained CBD stone noted. Pancreas: Unremarkable. No pancreatic ductal dilatation or surrounding inflammatory changes. Spleen: Normal in size without focal abnormality. Adrenals/Urinary Tract: The adrenal glands appear unremarkable. Status post prior right nephrectomy. There is marked left renal atrophy. Innumerable left renal hypodense lesions are not well characterized on this CT but may represent cysts. MRI or ultrasound may provide better characterization. This findings is similar to prior CT. There is no hydronephrosis on the left. No stone identified. The visualized ureter appears unremarkable. The urinary bladder is collapsed. Stomach/Bowel: There is sigmoid diverticulosis without active inflammatory changes. Scattered colonic diverticula in the proximal transverse as well as in the ascending colon noted. Mild haziness of the diverticula at the hepatic flexure, likely secondary to extension of inflammatory changes of the cholecystectomy. Diverticulitis  is less likely. There is no evidence of bowel obstruction. Normal appendix. Vascular/Lymphatic: Mild aortoiliac atherosclerotic disease. There is no aneurysmal dilatation or evidence of dissection. The origins of the celiac axis, SMA, IMA appear patent. The IVC appears patent. No portal venous gas identified. The SMV, splenic vein, and main portal vein are patent. There is no adenopathy. Reproductive: The prostate and seminal vesicles are grossly unremarkable. Other: Small fat containing umbilical hernia. Right anterior abdominal wall hernia repair mesh. Musculoskeletal: Degenerative changes of the spine. No acute fracture. Review of the MIP images confirms the above findings. IMPRESSION: No CT evidence of  aortic dissection or central pulmonary artery embolus. Inflammatory changes of the cholecystectomy with small amount of fluid in the subhepatic area concerning for an infectious process or bile leak. Correlation with clinical exam recommended. A hepatobiliary scintigraphy may provide better evaluation and assistant for possible chronic. No drainable fluid collection. Colonic diverticulosis without active inflammatory changes. No evidence of bowel obstruction. Normal appendix. Electronically Signed   By: Anner Crete M.D.   On: 03/15/2016 03:35   Ct Abdomen Pelvis W Contrast  Result Date: 03/15/2016 CLINICAL DATA:  55 year old male with back pain and pleuritic chest pain. EXAM: CT ANGIOGRAPHY CHEST CT ABDOMEN AND PELVIS WITH CONTRAST TECHNIQUE: Multidetector CT imaging of the chest was performed using the standard protocol during bolus administration of intravenous contrast. Multiplanar CT image reconstructions and MIPs were obtained to evaluate the vascular anatomy. Multidetector CT imaging of the abdomen and pelvis was performed using the standard protocol during bolus administration of intravenous contrast. CONTRAST:  80 cc Isovue 370 COMPARISON:  CT of the abdomen pelvis dated 09/07/2015 FINDINGS: CTA CHEST FINDINGS Cardiovascular: The thoracic aorta appears unremarkable. There is no aneurysmal dilatation or evidence of dissection. The origins of the great vessels of the aortic arch appear patent. There is non opacification of the left subclavian artery, likely related to timing of the contrast. There is mild dilatation of the main pulmonary trunk which may be represent a degree of pulmonary hypertension. Evaluation of the pulmonary arteries is limited due to respiratory motion artifact as well as streak artifact caused by patient's arms. No definite large central pulmonary artery embolus identified. There is moderate cardiomegaly. Multi vessel coronary vascular disease. No pericardial effusion.  Mediastinum/Nodes: There is no hilar or mediastinal adenopathy. The esophagus and the thyroid gland are grossly unremarkable. Lungs/Pleura: The lungs are clear. There is no pleural effusion or pneumothorax. The central airways are patent. Musculoskeletal: Degenerative changes of the spine. No acute fracture. Review of the MIP images confirms the above findings. CT ABDOMEN and PELVIS FINDINGS No intra-abdominal free air.  Small subhepatic free fluid. Hepatobiliary: A 9 mm left hepatic hypodense lesion is not well characterized but appears similar to prior CT and may represent a cyst or hemangioma. The liver is otherwise unremarkable. Mild intrahepatic biliary ductal dilatation. There is postsurgical changes of cholecystectomy. There is a small amount of fluid as well as inflammatory changes of the cholecystectomy bed. Although these may be postsurgical, findings are concerning for an infectious process or bile leak 1 month postoperative. Correlation with clinical exam recommended. A hepatobiliary scintigraphy may provide better evaluation and better assessment for possibility of bile leak. No discrete drainable fluid collection identified at this time. No retained CBD stone noted. Pancreas: Unremarkable. No pancreatic ductal dilatation or surrounding inflammatory changes. Spleen: Normal in size without focal abnormality. Adrenals/Urinary Tract: The adrenal glands appear unremarkable. Status post prior right nephrectomy. There is marked left renal atrophy. Innumerable  left renal hypodense lesions are not well characterized on this CT but may represent cysts. MRI or ultrasound may provide better characterization. This findings is similar to prior CT. There is no hydronephrosis on the left. No stone identified. The visualized ureter appears unremarkable. The urinary bladder is collapsed. Stomach/Bowel: There is sigmoid diverticulosis without active inflammatory changes. Scattered colonic diverticula in the proximal  transverse as well as in the ascending colon noted. Mild haziness of the diverticula at the hepatic flexure, likely secondary to extension of inflammatory changes of the cholecystectomy. Diverticulitis is less likely. There is no evidence of bowel obstruction. Normal appendix. Vascular/Lymphatic: Mild aortoiliac atherosclerotic disease. There is no aneurysmal dilatation or evidence of dissection. The origins of the celiac axis, SMA, IMA appear patent. The IVC appears patent. No portal venous gas identified. The SMV, splenic vein, and main portal vein are patent. There is no adenopathy. Reproductive: The prostate and seminal vesicles are grossly unremarkable. Other: Small fat containing umbilical hernia. Right anterior abdominal wall hernia repair mesh. Musculoskeletal: Degenerative changes of the spine. No acute fracture. Review of the MIP images confirms the above findings. IMPRESSION: No CT evidence of aortic dissection or central pulmonary artery embolus. Inflammatory changes of the cholecystectomy with small amount of fluid in the subhepatic area concerning for an infectious process or bile leak. Correlation with clinical exam recommended. A hepatobiliary scintigraphy may provide better evaluation and assistant for possible chronic. No drainable fluid collection. Colonic diverticulosis without active inflammatory changes. No evidence of bowel obstruction. Normal appendix. Electronically Signed   By: Anner Crete M.D.   On: 03/15/2016 03:35      Merryl Hacker, MD 03/15/16 [id number]

## 2016-03-16 ENCOUNTER — Encounter (HOSPITAL_COMMUNITY): Payer: Self-pay | Admitting: Cardiovascular Disease

## 2016-03-16 ENCOUNTER — Encounter (HOSPITAL_COMMUNITY)
Admission: EM | Disposition: A | Discharge status: Home / Self Care | Payer: Self-pay | Source: Home / Self Care | Attending: Internal Medicine

## 2016-03-16 HISTORY — PX: CARDIAC CATHETERIZATION: SHX172

## 2016-03-16 LAB — RENAL FUNCTION PANEL
Albumin: 3.3 g/dL — ABNORMAL LOW (ref 3.5–5.0)
Anion gap: 8 (ref 5–15)
BUN: 32 mg/dL — ABNORMAL HIGH (ref 6–20)
CO2: 28 mmol/L (ref 22–32)
Calcium: 8.4 mg/dL — ABNORMAL LOW (ref 8.9–10.3)
Chloride: 98 mmol/L — ABNORMAL LOW (ref 101–111)
Creatinine, Ser: 10.49 mg/dL — ABNORMAL HIGH (ref 0.61–1.24)
GFR calc Af Amer: 6 mL/min — ABNORMAL LOW (ref >60)
GFR calc non Af Amer: 5 mL/min — ABNORMAL LOW (ref >60)
Glucose, Bld: 90 mg/dL (ref 65–99)
Phosphorus: 4.9 mg/dL — ABNORMAL HIGH (ref 2.5–4.6)
Potassium: 4.9 mmol/L (ref 3.5–5.1)
Sodium: 134 mmol/L — ABNORMAL LOW (ref 135–145)

## 2016-03-16 LAB — PROTIME-INR
INR: 1.11
Prothrombin Time: 14.3 seconds (ref 11.4–15.2)

## 2016-03-16 LAB — POCT ACTIVATED CLOTTING TIME: Activated Clotting Time: 142 seconds

## 2016-03-16 LAB — MRSA PCR SCREENING: MRSA by PCR: NEGATIVE

## 2016-03-16 LAB — HEPARIN LEVEL (UNFRACTIONATED): Heparin Unfractionated: 0.32 IU/mL (ref 0.30–0.70)

## 2016-03-16 LAB — LIPASE, BLOOD: Lipase: 35 U/L (ref 11–51)

## 2016-03-16 SURGERY — LEFT HEART CATH AND CORONARY ANGIOGRAPHY

## 2016-03-16 MED ORDER — FENTANYL CITRATE (PF) 100 MCG/2ML IJ SOLN
INTRAMUSCULAR | Status: AC
  Filled 2016-03-16: qty 2

## 2016-03-16 MED ORDER — SODIUM CHLORIDE 0.9 % IV SOLN: 250.0000 mL | INTRAVENOUS | Status: DC | PRN

## 2016-03-16 MED ORDER — SODIUM CHLORIDE 0.9% FLUSH
3.0000 mL | Freq: Two times a day (BID) | INTRAVENOUS | Status: DC
  Administered 2016-03-16 – 2016-03-21 (×5): 3 mL via INTRAVENOUS

## 2016-03-16 MED ORDER — HEPARIN (PORCINE) IN NACL 2-0.9 UNIT/ML-% IJ SOLN
INTRAMUSCULAR | Status: DC | PRN
  Administered 2016-03-16: 500 mL

## 2016-03-16 MED ORDER — ACETAMINOPHEN 325 MG PO TABS: 650.0000 mg | ORAL_TABLET | ORAL | Status: DC | PRN

## 2016-03-16 MED ORDER — NEPRO/CARBSTEADY PO LIQD
237.0000 mL | Freq: Two times a day (BID) | ORAL | Status: DC
  Administered 2016-03-17 – 2016-03-21 (×4): 237 mL via ORAL
  Filled 2016-03-16 (×10): qty 237

## 2016-03-16 MED ORDER — WARFARIN - PHYSICIAN DOSING INPATIENT
Freq: Every day | Status: DC
  Administered 2016-03-16 – 2016-03-19 (×2)

## 2016-03-16 MED ORDER — MIDAZOLAM HCL 2 MG/2ML IJ SOLN
INTRAMUSCULAR | Status: AC
  Filled 2016-03-16: qty 2

## 2016-03-16 MED ORDER — WARFARIN SODIUM 5 MG PO TABS
10.0000 mg | ORAL_TABLET | Freq: Every day | ORAL | Status: DC
  Administered 2016-03-16 – 2016-03-17 (×2): 10 mg via ORAL
  Filled 2016-03-16 (×2): qty 2

## 2016-03-16 MED ORDER — SODIUM CHLORIDE 0.9 % IV SOLN
INTRAVENOUS | Status: DC | PRN
  Administered 2016-03-16: 10 mL/h via INTRAVENOUS

## 2016-03-16 MED ORDER — MIDAZOLAM HCL 2 MG/2ML IJ SOLN
INTRAMUSCULAR | Status: DC | PRN
  Administered 2016-03-16: 1 mg via INTRAVENOUS

## 2016-03-16 MED ORDER — LIDOCAINE HCL (PF) 1 % IJ SOLN
INTRAMUSCULAR | Status: AC
Start: 2016-03-16 — End: 2016-03-16
  Filled 2016-03-16: qty 30

## 2016-03-16 MED ORDER — IOPAMIDOL (ISOVUE-370) INJECTION 76%
INTRAVENOUS | Status: DC | PRN
  Administered 2016-03-16: 60 mL via INTRAVENOUS

## 2016-03-16 MED ORDER — ONDANSETRON HCL 4 MG/2ML IJ SOLN: 4.0000 mg | Freq: Four times a day (QID) | INTRAMUSCULAR | Status: DC | PRN

## 2016-03-16 MED ORDER — FENTANYL CITRATE (PF) 100 MCG/2ML IJ SOLN
INTRAMUSCULAR | Status: DC | PRN
  Administered 2016-03-16: 25 ug via INTRAVENOUS

## 2016-03-16 MED ORDER — LIDOCAINE HCL (PF) 1 % IJ SOLN
INTRAMUSCULAR | Status: DC | PRN
  Administered 2016-03-16: 20 mL

## 2016-03-16 MED ORDER — HEPARIN (PORCINE) IN NACL 100-0.45 UNIT/ML-% IJ SOLN
1300.0000 [IU]/h | INTRAMUSCULAR | Status: DC
  Administered 2016-03-16: 1200 [IU]/h via INTRAVENOUS
  Administered 2016-03-17 – 2016-03-20 (×4): 1300 [IU]/h via INTRAVENOUS
  Filled 2016-03-16 (×6): qty 250

## 2016-03-16 MED ORDER — SODIUM CHLORIDE 0.9% FLUSH: 3.0000 mL | INTRAVENOUS | Status: DC | PRN

## 2016-03-16 MED ORDER — IOPAMIDOL (ISOVUE-370) INJECTION 76%
INTRAVENOUS | Status: AC
  Filled 2016-03-16: qty 100

## 2016-03-16 MED ORDER — SODIUM CHLORIDE 0.9 % IV SOLN: INTRAVENOUS | Status: AC

## 2016-03-16 MED ORDER — HEPARIN (PORCINE) IN NACL 2-0.9 UNIT/ML-% IJ SOLN
INTRAMUSCULAR | Status: AC
  Filled 2016-03-16: qty 500

## 2016-03-16 SURGICAL SUPPLY — 7 items
CATH INFINITI 5FR MULTPACK ANG (CATHETERS) ×1 IMPLANT
KIT HEART LEFT (KITS) ×2 IMPLANT
PACK CARDIAC CATHETERIZATION (CUSTOM PROCEDURE TRAY) ×2 IMPLANT
SHEATH PINNACLE 5F 10CM (SHEATH) ×1 IMPLANT
SYR MEDRAD MARK V 150ML (SYRINGE) ×2 IMPLANT
TRANSDUCER W/STOPCOCK (MISCELLANEOUS) ×2 IMPLANT
WIRE EMERALD 3MM-J .035X150CM (WIRE) ×1 IMPLANT

## 2016-03-16 NOTE — Progress Notes (Signed)
[facility] Progress Note     Subjective: C/o epigastric, LUQ, and LLQ abdominal pain that is tight in nature. Reports nausea and one episode of vomiting yesterday.  Still having 4 loose stools daily.    Objective: Vital signs in last 24 hours: Temp:  [97.6 F (36.4 C)-98.2 F (36.8 C)] 98.2 F (36.8 C) (10/24 0650) Pulse Rate:  [65-85] 65 (10/24 0650) Resp:  [17-18] 18 (10/24 0650) BP: (132-182)/(88-113) 132/93 (10/24 0650) SpO2:  [100 %] 100 % (10/24 0650) Weight:  [241 lb 11.2 oz (109.6 kg)-251 lb 1.7 oz (113.9 kg)] 241 lb 11.2 oz (109.6 kg) (10/24 0650) Last BM Date: 03/15/16  Intake/Output from previous day: 10/23 0701 - 10/24 0700 In: 76.1 [I.V.:76.1] Out: 4000  Intake/Output this shift: No intake/output data recorded.  PE: Gen:  Alert, NAD, pleasant Card:  RRR, no M/G/R heard Abd: Soft, mild tenderness to palpation of epigastrium and LUQ, +BS, incisions C/D/I, no peritonitis or guarding   Lab Results:   Recent Labs  03/14/16 2214 03/15/16 1607  WBC 4.7 3.9*  HGB 11.3* 11.0*  HCT 34.6* 33.4*  PLT 144* 140*   BMET  Recent Labs  03/14/16 2214 03/15/16 1606  NA 136 133*  K 4.6 6.1*  CL 100* 99*  CO2 25 24  GLUCOSE 98 102*  BUN 50* 60*  CREATININE 13.40* 15.23*  CALCIUM 8.1* 7.8*   PT/INR  Recent Labs  03/14/16 2214  LABPROT 13.6  INR 1.04   CMP     Component Value Date/Time   NA 133 (L) 03/15/2016 1606   K 6.1 (H) 03/15/2016 1606   CL 99 (L) 03/15/2016 1606   CO2 24 03/15/2016 1606   GLUCOSE 102 (H) 03/15/2016 1606   BUN 60 (H) 03/15/2016 1606   CREATININE 15.23 (H) 03/15/2016 1606   CALCIUM 7.8 (L) 03/15/2016 1606   PROT 7.3 03/14/2016 2214   ALBUMIN 3.1 (L) 03/15/2016 1606   AST 13 (L) 03/14/2016 2214   ALT 12 (L) 03/14/2016 2214   ALKPHOS 101 03/14/2016 2214   BILITOT 0.9 03/14/2016 2214   GFRNONAA 3 (L) 03/15/2016 1606   GFRAA 4 (L) 03/15/2016 1606   Lipase     Component Value Date/Time   LIPASE 125 (H)  03/14/2016 2214   Studies/Results: Dg Chest 2 View  Result Date: 03/14/2016 CLINICAL DATA:  Shortness of breath EXAM: CHEST  2 VIEW COMPARISON:  None. FINDINGS: Slight elevation of the left diaphragm. There is mild to moderate enlargement of the cardiomediastinal silhouette. There is mild central vascular congestion. No pleural effusion or focal consolidation. Mild atherosclerosis of the aorta. No pneumothorax. IMPRESSION: Stable cardiomegaly with mild central vascular congestion. No overt edema, consolidation or effusion. Atherosclerotic vascular disease of the aorta. Electronically Signed   By: Donavan Foil M.D.   On: 03/14/2016 23:41   Dg Chest 2 View  Result Date: 03/14/2016 CLINICAL DATA:  Congestion for 5 days with productive cough, vomiting, nausea, right scapular pain, and right rib pain. EXAM: CHEST  2 VIEW COMPARISON:  09/07/2015 FINDINGS: Enlargement of the cardiac silhouette is unchanged. There is mild prominence of the central pulmonary arteries with decreased pulmonary vascular congestion compared to the prior study. There is no evidence confluent airspace opacity, edema, pleural effusion, or pneumothorax. No acute osseous abnormality is identified. IMPRESSION: Cardiomegaly with decreased pulmonary vascular congestion. No evidence of edema or pneumonia. Electronically Signed   By: Logan Bores M.D.   On: 03/14/2016 18:53   Ct Angio Chest Pe W  And/or Wo Contrast  Result Date: 03/15/2016 CLINICAL DATA:  55 year old male with back pain and pleuritic chest pain. EXAM: CT ANGIOGRAPHY CHEST CT ABDOMEN AND PELVIS WITH CONTRAST TECHNIQUE: Multidetector CT imaging of the chest was performed using the standard protocol during bolus administration of intravenous contrast. Multiplanar CT image reconstructions and MIPs were obtained to evaluate the vascular anatomy. Multidetector CT imaging of the abdomen and pelvis was performed using the standard protocol during bolus administration of  intravenous contrast. CONTRAST:  80 cc Isovue 370 COMPARISON:  CT of the abdomen pelvis dated 09/07/2015 FINDINGS: CTA CHEST FINDINGS Cardiovascular: The thoracic aorta appears unremarkable. There is no aneurysmal dilatation or evidence of dissection. The origins of the great vessels of the aortic arch appear patent. There is non opacification of the left subclavian artery, likely related to timing of the contrast. There is mild dilatation of the main pulmonary trunk which may be represent a degree of pulmonary hypertension. Evaluation of the pulmonary arteries is limited due to respiratory motion artifact as well as streak artifact caused by patient's arms. No definite large central pulmonary artery embolus identified. There is moderate cardiomegaly. Multi vessel coronary vascular disease. No pericardial effusion. Mediastinum/Nodes: There is no hilar or mediastinal adenopathy. The esophagus and the thyroid gland are grossly unremarkable. Lungs/Pleura: The lungs are clear. There is no pleural effusion or pneumothorax. The central airways are patent. Musculoskeletal: Degenerative changes of the spine. No acute fracture. Review of the MIP images confirms the above findings. CT ABDOMEN and PELVIS FINDINGS No intra-abdominal free air.  Small subhepatic free fluid. Hepatobiliary: A 9 mm left hepatic hypodense lesion is not well characterized but appears similar to prior CT and may represent a cyst or hemangioma. The liver is otherwise unremarkable. Mild intrahepatic biliary ductal dilatation. There is postsurgical changes of cholecystectomy. There is a small amount of fluid as well as inflammatory changes of the cholecystectomy bed. Although these may be postsurgical, findings are concerning for an infectious process or bile leak 1 month postoperative. Correlation with clinical exam recommended. A hepatobiliary scintigraphy may provide better evaluation and better assessment for possibility of bile leak. No discrete  drainable fluid collection identified at this time. No retained CBD stone noted. Pancreas: Unremarkable. No pancreatic ductal dilatation or surrounding inflammatory changes. Spleen: Normal in size without focal abnormality. Adrenals/Urinary Tract: The adrenal glands appear unremarkable. Status post prior right nephrectomy. There is marked left renal atrophy. Innumerable left renal hypodense lesions are not well characterized on this CT but may represent cysts. MRI or ultrasound may provide better characterization. This findings is similar to prior CT. There is no hydronephrosis on the left. No stone identified. The visualized ureter appears unremarkable. The urinary bladder is collapsed. Stomach/Bowel: There is sigmoid diverticulosis without active inflammatory changes. Scattered colonic diverticula in the proximal transverse as well as in the ascending colon noted. Mild haziness of the diverticula at the hepatic flexure, likely secondary to extension of inflammatory changes of the cholecystectomy. Diverticulitis is less likely. There is no evidence of bowel obstruction. Normal appendix. Vascular/Lymphatic: Mild aortoiliac atherosclerotic disease. There is no aneurysmal dilatation or evidence of dissection. The origins of the celiac axis, SMA, IMA appear patent. The IVC appears patent. No portal venous gas identified. The SMV, splenic vein, and main portal vein are patent. There is no adenopathy. Reproductive: The prostate and seminal vesicles are grossly unremarkable. Other: Small fat containing umbilical hernia. Right anterior abdominal wall hernia repair mesh. Musculoskeletal: Degenerative changes of the spine. No acute fracture. Review of  the MIP images confirms the above findings. IMPRESSION: No CT evidence of aortic dissection or central pulmonary artery embolus. Inflammatory changes of the cholecystectomy with small amount of fluid in the subhepatic area concerning for an infectious process or bile leak.  Correlation with clinical exam recommended. A hepatobiliary scintigraphy may provide better evaluation and assistant for possible chronic. No drainable fluid collection. Colonic diverticulosis without active inflammatory changes. No evidence of bowel obstruction. Normal appendix. Electronically Signed   By: Anner Crete M.D.   On: 03/15/2016 03:35   Nm Hepatobiliary Liver Func  Result Date: 03/15/2016 CLINICAL DATA:  Cholecystectomy 02/12/2016. RIGHT-sided abdominal pain and back pain. EXAM: NUCLEAR MEDICINE HEPATOBILIARY IMAGING TECHNIQUE: Sequential images of the abdomen were obtained out to 60 minutes following intravenous administration of radiopharmaceutical. RADIOPHARMACEUTICALS:  5.0 mCi Tc-39m  Choletec IV COMPARISON:  CT 03/15/2016 FINDINGS: Uniform uptake of radiotracer within the liver. Counts are evident within the common bile duct and small bowel by 30 minutes. There is no evidence of extra biliary radiotracer to suggest bile leak. IMPRESSION: 1. No evidence of bile leak. 2. Patent common bile duct. Electronically Signed   By: Suzy Bouchard M.D.   On: 03/15/2016 11:15   Ct Abdomen Pelvis W Contrast  Result Date: 03/15/2016 CLINICAL DATA:  55 year old male with back pain and pleuritic chest pain. EXAM: CT ANGIOGRAPHY CHEST CT ABDOMEN AND PELVIS WITH CONTRAST TECHNIQUE: Multidetector CT imaging of the chest was performed using the standard protocol during bolus administration of intravenous contrast. Multiplanar CT image reconstructions and MIPs were obtained to evaluate the vascular anatomy. Multidetector CT imaging of the abdomen and pelvis was performed using the standard protocol during bolus administration of intravenous contrast. CONTRAST:  80 cc Isovue 370 COMPARISON:  CT of the abdomen pelvis dated 09/07/2015 FINDINGS: CTA CHEST FINDINGS Cardiovascular: The thoracic aorta appears unremarkable. There is no aneurysmal dilatation or evidence of dissection. The origins of the great  vessels of the aortic arch appear patent. There is non opacification of the left subclavian artery, likely related to timing of the contrast. There is mild dilatation of the main pulmonary trunk which may be represent a degree of pulmonary hypertension. Evaluation of the pulmonary arteries is limited due to respiratory motion artifact as well as streak artifact caused by patient's arms. No definite large central pulmonary artery embolus identified. There is moderate cardiomegaly. Multi vessel coronary vascular disease. No pericardial effusion. Mediastinum/Nodes: There is no hilar or mediastinal adenopathy. The esophagus and the thyroid gland are grossly unremarkable. Lungs/Pleura: The lungs are clear. There is no pleural effusion or pneumothorax. The central airways are patent. Musculoskeletal: Degenerative changes of the spine. No acute fracture. Review of the MIP images confirms the above findings. CT ABDOMEN and PELVIS FINDINGS No intra-abdominal free air.  Small subhepatic free fluid. Hepatobiliary: A 9 mm left hepatic hypodense lesion is not well characterized but appears similar to prior CT and may represent a cyst or hemangioma. The liver is otherwise unremarkable. Mild intrahepatic biliary ductal dilatation. There is postsurgical changes of cholecystectomy. There is a small amount of fluid as well as inflammatory changes of the cholecystectomy bed. Although these may be postsurgical, findings are concerning for an infectious process or bile leak 1 month postoperative. Correlation with clinical exam recommended. A hepatobiliary scintigraphy may provide better evaluation and better assessment for possibility of bile leak. No discrete drainable fluid collection identified at this time. No retained CBD stone noted. Pancreas: Unremarkable. No pancreatic ductal dilatation or surrounding inflammatory changes. Spleen: Normal in  size without focal abnormality. Adrenals/Urinary Tract: The adrenal glands appear  unremarkable. Status post prior right nephrectomy. There is marked left renal atrophy. Innumerable left renal hypodense lesions are not well characterized on this CT but may represent cysts. MRI or ultrasound may provide better characterization. This findings is similar to prior CT. There is no hydronephrosis on the left. No stone identified. The visualized ureter appears unremarkable. The urinary bladder is collapsed. Stomach/Bowel: There is sigmoid diverticulosis without active inflammatory changes. Scattered colonic diverticula in the proximal transverse as well as in the ascending colon noted. Mild haziness of the diverticula at the hepatic flexure, likely secondary to extension of inflammatory changes of the cholecystectomy. Diverticulitis is less likely. There is no evidence of bowel obstruction. Normal appendix. Vascular/Lymphatic: Mild aortoiliac atherosclerotic disease. There is no aneurysmal dilatation or evidence of dissection. The origins of the celiac axis, SMA, IMA appear patent. The IVC appears patent. No portal venous gas identified. The SMV, splenic vein, and main portal vein are patent. There is no adenopathy. Reproductive: The prostate and seminal vesicles are grossly unremarkable. Other: Small fat containing umbilical hernia. Right anterior abdominal wall hernia repair mesh. Musculoskeletal: Degenerative changes of the spine. No acute fracture. Review of the MIP images confirms the above findings. IMPRESSION: No CT evidence of aortic dissection or central pulmonary artery embolus. Inflammatory changes of the cholecystectomy with small amount of fluid in the subhepatic area concerning for an infectious process or bile leak. Correlation with clinical exam recommended. A hepatobiliary scintigraphy may provide better evaluation and assistant for possible chronic. No drainable fluid collection. Colonic diverticulosis without active inflammatory changes. No evidence of bowel obstruction. Normal  appendix. Electronically Signed   By: Anner Crete M.D.   On: 03/15/2016 03:35    Anti-infectives: Anti-infectives    None     Assessment/Plan Abdominal pain S/p laparoscopic LOA and cholecystectomy 02/12/16, Dr. Zella Richer  - HIDA 10/23 negative for post-operative bile leak; no RUQ pain; LFT's WNL; low suspicion pain related to previous surgery - lipase 10/22 was 125, will repeat today - nausea, vomiting, and dull epigastric pain would be consistent with mild acute pancreatitis.  HTN Dilated CM CAD Diabetes mellitus ESRD - dialysis M/W/F Elevated troponin - r/o MI; cardiac cath today by Dr Doylene Canard   FEN: ID: none  VTE: heparin, SCD's  Dispo: cardiac cath today by Dr. Doylene Canard Repeat lipase    LOS: 1 day    Jill Alexanders , [facility] 03/16/2016, 8:24 AM Pager: [phone] Consults: [phone] Mon-Fri 7:00 am-4:30 pm Sat-Sun 7:00 am-11:30 am

## 2016-03-16 NOTE — Progress Notes (Signed)
Subjective: Interval History: has complaints stom still bothers but some better.  Objective: Vital signs in last 24 hours: Temp:  [97.6 F (36.4 C)-98.2 F (36.8 C)] 97.8 F (36.6 C) (10/24 1129) Pulse Rate:  [65-85] 73 (10/24 1129) Resp:  [12-21] 21 (10/24 1129) BP: (132-182)/(55-113) 171/83 (10/24 1129) SpO2:  [100 %] 100 % (10/24 1129) Weight:  [109.6 kg (241 lb 11.2 oz)-113.9 kg (251 lb 1.7 oz)] 109.6 kg (241 lb 11.2 oz) (10/24 0650) Weight change: -5.804 kg (-12 lb 12.7 oz)  Intake/Output from previous day: 10/23 0701 - 10/24 0700 In: 76.1 [I.V.:76.1] Out: 4000  Intake/Output this shift: No intake/output data recorded.  General appearance: alert, cooperative, no distress and moderately obese Resp: diminished breath sounds bilaterally Cardio: S1, S2 normal and systolic murmur: holosystolic 2/6, blowing at apex GI: obese, pos bs, tender diffusely , more LUQ/L mid abdm but also RUQ Extremities: AVF L FA  Lab Results:  Recent Labs  03/14/16 2214 03/15/16 1607  WBC 4.7 3.9*  HGB 11.3* 11.0*  HCT 34.6* 33.4*  PLT 144* 140*   BMET:  Recent Labs  03/14/16 2214 03/15/16 1606  NA 136 133*  K 4.6 6.1*  CL 100* 99*  CO2 25 24  GLUCOSE 98 102*  BUN 50* 60*  CREATININE 13.40* 15.23*  CALCIUM 8.1* 7.8*   No results for input(s): PTH in the last 72 hours. Iron Studies: No results for input(s): IRON, TIBC, TRANSFERRIN, FERRITIN in the last 72 hours.  Studies/Results: Dg Chest 2 View  Result Date: 03/14/2016 CLINICAL DATA:  Shortness of breath EXAM: CHEST  2 VIEW COMPARISON:  None. FINDINGS: Slight elevation of the left diaphragm. There is mild to moderate enlargement of the cardiomediastinal silhouette. There is mild central vascular congestion. No pleural effusion or focal consolidation. Mild atherosclerosis of the aorta. No pneumothorax. IMPRESSION: Stable cardiomegaly with mild central vascular congestion. No overt edema, consolidation or effusion. Atherosclerotic  vascular disease of the aorta. Electronically Signed   By: Donavan Foil M.D.   On: 03/14/2016 23:41   Dg Chest 2 View  Result Date: 03/14/2016 CLINICAL DATA:  Congestion for 5 days with productive cough, vomiting, nausea, right scapular pain, and right rib pain. EXAM: CHEST  2 VIEW COMPARISON:  09/07/2015 FINDINGS: Enlargement of the cardiac silhouette is unchanged. There is mild prominence of the central pulmonary arteries with decreased pulmonary vascular congestion compared to the prior study. There is no evidence confluent airspace opacity, edema, pleural effusion, or pneumothorax. No acute osseous abnormality is identified. IMPRESSION: Cardiomegaly with decreased pulmonary vascular congestion. No evidence of edema or pneumonia. Electronically Signed   By: Logan Bores M.D.   On: 03/14/2016 18:53   Ct Angio Chest Pe W And/or Wo Contrast  Result Date: 03/15/2016 CLINICAL DATA:  55 year old male with back pain and pleuritic chest pain. EXAM: CT ANGIOGRAPHY CHEST CT ABDOMEN AND PELVIS WITH CONTRAST TECHNIQUE: Multidetector CT imaging of the chest was performed using the standard protocol during bolus administration of intravenous contrast. Multiplanar CT image reconstructions and MIPs were obtained to evaluate the vascular anatomy. Multidetector CT imaging of the abdomen and pelvis was performed using the standard protocol during bolus administration of intravenous contrast. CONTRAST:  80 cc Isovue 370 COMPARISON:  CT of the abdomen pelvis dated 09/07/2015 FINDINGS: CTA CHEST FINDINGS Cardiovascular: The thoracic aorta appears unremarkable. There is no aneurysmal dilatation or evidence of dissection. The origins of the great vessels of the aortic arch appear patent. There is non opacification of the left subclavian  artery, likely related to timing of the contrast. There is mild dilatation of the main pulmonary trunk which may be represent a degree of pulmonary hypertension. Evaluation of the pulmonary  arteries is limited due to respiratory motion artifact as well as streak artifact caused by patient's arms. No definite large central pulmonary artery embolus identified. There is moderate cardiomegaly. Multi vessel coronary vascular disease. No pericardial effusion. Mediastinum/Nodes: There is no hilar or mediastinal adenopathy. The esophagus and the thyroid gland are grossly unremarkable. Lungs/Pleura: The lungs are clear. There is no pleural effusion or pneumothorax. The central airways are patent. Musculoskeletal: Degenerative changes of the spine. No acute fracture. Review of the MIP images confirms the above findings. CT ABDOMEN and PELVIS FINDINGS No intra-abdominal free air.  Small subhepatic free fluid. Hepatobiliary: A 9 mm left hepatic hypodense lesion is not well characterized but appears similar to prior CT and may represent a cyst or hemangioma. The liver is otherwise unremarkable. Mild intrahepatic biliary ductal dilatation. There is postsurgical changes of cholecystectomy. There is a small amount of fluid as well as inflammatory changes of the cholecystectomy bed. Although these may be postsurgical, findings are concerning for an infectious process or bile leak 1 month postoperative. Correlation with clinical exam recommended. A hepatobiliary scintigraphy may provide better evaluation and better assessment for possibility of bile leak. No discrete drainable fluid collection identified at this time. No retained CBD stone noted. Pancreas: Unremarkable. No pancreatic ductal dilatation or surrounding inflammatory changes. Spleen: Normal in size without focal abnormality. Adrenals/Urinary Tract: The adrenal glands appear unremarkable. Status post prior right nephrectomy. There is marked left renal atrophy. Innumerable left renal hypodense lesions are not well characterized on this CT but may represent cysts. MRI or ultrasound may provide better characterization. This findings is similar to prior CT. There  is no hydronephrosis on the left. No stone identified. The visualized ureter appears unremarkable. The urinary bladder is collapsed. Stomach/Bowel: There is sigmoid diverticulosis without active inflammatory changes. Scattered colonic diverticula in the proximal transverse as well as in the ascending colon noted. Mild haziness of the diverticula at the hepatic flexure, likely secondary to extension of inflammatory changes of the cholecystectomy. Diverticulitis is less likely. There is no evidence of bowel obstruction. Normal appendix. Vascular/Lymphatic: Mild aortoiliac atherosclerotic disease. There is no aneurysmal dilatation or evidence of dissection. The origins of the celiac axis, SMA, IMA appear patent. The IVC appears patent. No portal venous gas identified. The SMV, splenic vein, and main portal vein are patent. There is no adenopathy. Reproductive: The prostate and seminal vesicles are grossly unremarkable. Other: Small fat containing umbilical hernia. Right anterior abdominal wall hernia repair mesh. Musculoskeletal: Degenerative changes of the spine. No acute fracture. Review of the MIP images confirms the above findings. IMPRESSION: No CT evidence of aortic dissection or central pulmonary artery embolus. Inflammatory changes of the cholecystectomy with small amount of fluid in the subhepatic area concerning for an infectious process or bile leak. Correlation with clinical exam recommended. A hepatobiliary scintigraphy may provide better evaluation and assistant for possible chronic. No drainable fluid collection. Colonic diverticulosis without active inflammatory changes. No evidence of bowel obstruction. Normal appendix. Electronically Signed   By: Anner Crete M.D.   On: 03/15/2016 03:35   Nm Hepatobiliary Liver Func  Result Date: 03/15/2016 CLINICAL DATA:  Cholecystectomy 02/12/2016. RIGHT-sided abdominal pain and back pain. EXAM: NUCLEAR MEDICINE HEPATOBILIARY IMAGING TECHNIQUE: Sequential  images of the abdomen were obtained out to 60 minutes following intravenous administration of radiopharmaceutical. RADIOPHARMACEUTICALS:  5.0 mCi Tc-103m  Choletec IV COMPARISON:  CT 03/15/2016 FINDINGS: Uniform uptake of radiotracer within the liver. Counts are evident within the common bile duct and small bowel by 30 minutes. There is no evidence of extra biliary radiotracer to suggest bile leak. IMPRESSION: 1. No evidence of bile leak. 2. Patent common bile duct. Electronically Signed   By: Suzy Bouchard M.D.   On: 03/15/2016 11:15   Ct Abdomen Pelvis W Contrast  Result Date: 03/15/2016 CLINICAL DATA:  55 year old male with back pain and pleuritic chest pain. EXAM: CT ANGIOGRAPHY CHEST CT ABDOMEN AND PELVIS WITH CONTRAST TECHNIQUE: Multidetector CT imaging of the chest was performed using the standard protocol during bolus administration of intravenous contrast. Multiplanar CT image reconstructions and MIPs were obtained to evaluate the vascular anatomy. Multidetector CT imaging of the abdomen and pelvis was performed using the standard protocol during bolus administration of intravenous contrast. CONTRAST:  80 cc Isovue 370 COMPARISON:  CT of the abdomen pelvis dated 09/07/2015 FINDINGS: CTA CHEST FINDINGS Cardiovascular: The thoracic aorta appears unremarkable. There is no aneurysmal dilatation or evidence of dissection. The origins of the great vessels of the aortic arch appear patent. There is non opacification of the left subclavian artery, likely related to timing of the contrast. There is mild dilatation of the main pulmonary trunk which may be represent a degree of pulmonary hypertension. Evaluation of the pulmonary arteries is limited due to respiratory motion artifact as well as streak artifact caused by patient's arms. No definite large central pulmonary artery embolus identified. There is moderate cardiomegaly. Multi vessel coronary vascular disease. No pericardial effusion. Mediastinum/Nodes:  There is no hilar or mediastinal adenopathy. The esophagus and the thyroid gland are grossly unremarkable. Lungs/Pleura: The lungs are clear. There is no pleural effusion or pneumothorax. The central airways are patent. Musculoskeletal: Degenerative changes of the spine. No acute fracture. Review of the MIP images confirms the above findings. CT ABDOMEN and PELVIS FINDINGS No intra-abdominal free air.  Small subhepatic free fluid. Hepatobiliary: A 9 mm left hepatic hypodense lesion is not well characterized but appears similar to prior CT and may represent a cyst or hemangioma. The liver is otherwise unremarkable. Mild intrahepatic biliary ductal dilatation. There is postsurgical changes of cholecystectomy. There is a small amount of fluid as well as inflammatory changes of the cholecystectomy bed. Although these may be postsurgical, findings are concerning for an infectious process or bile leak 1 month postoperative. Correlation with clinical exam recommended. A hepatobiliary scintigraphy may provide better evaluation and better assessment for possibility of bile leak. No discrete drainable fluid collection identified at this time. No retained CBD stone noted. Pancreas: Unremarkable. No pancreatic ductal dilatation or surrounding inflammatory changes. Spleen: Normal in size without focal abnormality. Adrenals/Urinary Tract: The adrenal glands appear unremarkable. Status post prior right nephrectomy. There is marked left renal atrophy. Innumerable left renal hypodense lesions are not well characterized on this CT but may represent cysts. MRI or ultrasound may provide better characterization. This findings is similar to prior CT. There is no hydronephrosis on the left. No stone identified. The visualized ureter appears unremarkable. The urinary bladder is collapsed. Stomach/Bowel: There is sigmoid diverticulosis without active inflammatory changes. Scattered colonic diverticula in the proximal transverse as well as  in the ascending colon noted. Mild haziness of the diverticula at the hepatic flexure, likely secondary to extension of inflammatory changes of the cholecystectomy. Diverticulitis is less likely. There is no evidence of bowel obstruction. Normal appendix. Vascular/Lymphatic: Mild aortoiliac atherosclerotic disease. There  is no aneurysmal dilatation or evidence of dissection. The origins of the celiac axis, SMA, IMA appear patent. The IVC appears patent. No portal venous gas identified. The SMV, splenic vein, and main portal vein are patent. There is no adenopathy. Reproductive: The prostate and seminal vesicles are grossly unremarkable. Other: Small fat containing umbilical hernia. Right anterior abdominal wall hernia repair mesh. Musculoskeletal: Degenerative changes of the spine. No acute fracture. Review of the MIP images confirms the above findings. IMPRESSION: No CT evidence of aortic dissection or central pulmonary artery embolus. Inflammatory changes of the cholecystectomy with small amount of fluid in the subhepatic area concerning for an infectious process or bile leak. Correlation with clinical exam recommended. A hepatobiliary scintigraphy may provide better evaluation and assistant for possible chronic. No drainable fluid collection. Colonic diverticulosis without active inflammatory changes. No evidence of bowel obstruction. Normal appendix. Electronically Signed   By: Anner Crete M.D.   On: 03/15/2016 03:35    I have reviewed the patient's current medications.  Assessment/Plan: 1 ESRD HD MWF . Still vol xs. Did well on HD, needs lower vol 2 HTN lower dry, give meds 3 DM controlled 4 Anemia stable 5 HPTH meds 6 obesity 7 CP per cards 8 Abdm pain, N, V  etio not clear needs eval P lipase, HD in am, cath results pending, control bp    LOS: 1 day   Vidhi Delellis L 03/16/2016,11:51 AM

## 2016-03-16 NOTE — Progress Notes (Addendum)
Site area: RFA Site Prior to Removal:  Level 0 Pressure Applied For:20 min Manual:   yes Patient Status During Pull:  stable Post Pull Site:  Level 0 Post Pull Instructions Given:   Post Pull Pulses Present: palpable Dressing Applied:tegaderm   Bedrest begins @ 9166 till 1505 Comments:

## 2016-03-16 NOTE — H&P (View-Only) (Signed)
Patient in nuclear medicine getting a HIDA scan.  Will recheck later today.  [name]. Dahlia Bailiff, MD, [place] [phone] [facility]

## 2016-03-16 NOTE — Progress Notes (Signed)
Progress Note    [name]  DTO:[id number] DOB: May 11, 1961  DOA: 03/14/2016 PCP: Maggie Font, MD    Brief Narrative:   Chief complaint: F/U abdominal pain, troponin elevation  Multiple office records/hospital notes reviewed.   [name] is an 55 y.o. male with a PMH of ESRD secondary to HTN with solitary kidney on HD MWF, biliary dyskinesia s/p laparoscopic lysis of adhesions and cholecystectomy with cholangiogram 02/12/16, atrial flutter on chronic coumadin (although INR low on admission, raising question of compliance), DM II, Obesity, HCC s/p right nephrectomy, chronic systolic CHF with an EF of 20-25% (per Echo 06/2015) and dilated non-ischemic cardiomyopathy who was admitted 03/14/16 with chief complaint of right upper quadrant abdominal pain and lower central chest pain. In the ED, his troponin was elevated at 0.2 to and a CT scan of his abdomen showed fluid in the subhepatic area with inflammation concerning for infection versus bile leak.  Assessment/Plan:   Principal Problem:   RUQ pain rule out postoperative bile leak Dr. Grandville Silos consulting. HIDA scan negative for bile leak. Lipase also elevated. Pain may be pleuritic given cough and pleuritic quality.  Active Problems:   Hypertension, benign Continue Norvasc, Coreg and Isordil.    DM II (diabetes mellitus, type II), controlled ([place]) Diet controlled.    End stage renal disease ([place]) Dialyzed 03/15/16.Continue Sensipar, Renvela.    Elevated troponin Spoke with the patient's cardiologist, Dr. Doylene Canard who took him for cardiac catheterization today. Results pending. Continue IV heparin for now.  Family Communication/Anticipated D/C date and plan/Code Status   DVT prophylaxis: Therapeutic dose heparin ordered. Code Status: Full Code.  Family Communication: No family at the bedside. Disposition Plan: Home when cleared by cardiologist.   Medical Consultants:    General  Surgery  Nephrology  Cardiology   Procedures:    Cardiac catheterization 03/16/16  Anti-Infectives:    None  Subjective:   ROS: The patient reports that He continues to have Right-sided abdominal/flank pain. No current dyspnea, but he does have occasional cough. No nausea, vomiting, or diaphoretic spells.  Objective:    Vitals:   03/15/16 2001 03/15/16 2004 03/15/16 2126 03/16/16 0650  BP: (!) 152/88 (!) 149/95 (!) 157/108 (!) 132/93  Pulse: 84 78 85 65  Resp: 17  18 18   Temp: 97.6 F (36.4 C)  97.8 F (36.6 C) 98.2 F (36.8 C)  TempSrc:   Oral Oral  SpO2:   100% 100%  Weight: 109.8 kg (242 lb 1 oz)   109.6 kg (241 lb 11.2 oz)  Height:        Intake/Output Summary (Last 24 hours) at 03/16/16 0838 Last data filed at 03/16/16 0026  Gross per 24 hour  Intake            76.07 ml  Output             4000 ml  Net         -3923.93 ml   Filed Weights   03/15/16 1552 03/15/16 2001 03/16/16 0650  Weight: 113.9 kg (251 lb 1.7 oz) 109.8 kg (242 lb 1 oz) 109.6 kg (241 lb 11.2 oz)    Exam: General exam: Appears calm and comfortable.  Respiratory system: Clear to auscultation. Respiratory effort normal. Cardiovascular system: S1 & S2 heard, RRR. No JVD,  rubs, gallops or clicks. II/VI murmur. Gastrointestinal system: Abdomen is nondistended, soft, mildly tender. No organomegaly or masses felt. Normal bowel sounds heard. Central nervous system: Alert and oriented. No focal  neurological deficits. Extremities: No clubbing,  or cyanosis. No edema. LUE AVF with + thrill/bruit. Skin: No rashes, lesions or ulcers. Psychiatry: Judgement and insight appear normal. Mood & affect appropriate.   Data Reviewed:   I have personally reviewed following labs and imaging studies:  Labs: Basic Metabolic Panel:  Recent Labs Lab 03/14/16 2214 03/15/16 1606  NA 136 133*  K 4.6 6.1*  CL 100* 99*  CO2 25 24  GLUCOSE 98 102*  BUN 50* 60*  CREATININE 13.40* 15.23*  CALCIUM 8.1*  7.8*  PHOS  --  4.1   GFR Estimated Creatinine Clearance: 7.4 mL/min (by C-G formula based on SCr of 15.23 mg/dL (H)). Liver Function Tests:  Recent Labs Lab 03/14/16 2214 03/15/16 1606  AST 13*  --   ALT 12*  --   ALKPHOS 101  --   BILITOT 0.9  --   PROT 7.3  --   ALBUMIN 3.4* 3.1*    Recent Labs Lab 03/14/16 2214  LIPASE 125*   Coagulation profile  Recent Labs Lab 03/14/16 2214  INR 1.04    CBC:  Recent Labs Lab 03/14/16 2214 03/15/16 1607  WBC 4.7 3.9*  NEUTROABS 2.9  --   HGB 11.3* 11.0*  HCT 34.6* 33.4*  MCV 96.4 95.4  PLT 144* 140*   Cardiac Enzymes:  Recent Labs Lab 03/14/16 2214 03/15/16 0119 03/15/16 0437 03/15/16 1035 03/15/16 1607  TROPONINI 0.22* 0.21* 0.22* 0.24* 0.21*    Microbiology Recent Results (from the past 240 hour(s))  MRSA PCR Screening     Status: None   Collection Time: 03/16/16  2:50 AM  Result Value Ref Range Status   MRSA by PCR NEGATIVE NEGATIVE Final    Comment:        The GeneXpert MRSA Assay (FDA approved for NASAL specimens only), is one component of a comprehensive MRSA colonization surveillance program. It is not intended to diagnose MRSA infection nor to guide or monitor treatment for MRSA infections.     Radiology: Dg Chest 2 View  Result Date: 03/14/2016 CLINICAL DATA:  Shortness of breath EXAM: CHEST  2 VIEW COMPARISON:  None. FINDINGS: Slight elevation of the left diaphragm. There is mild to moderate enlargement of the cardiomediastinal silhouette. There is mild central vascular congestion. No pleural effusion or focal consolidation. Mild atherosclerosis of the aorta. No pneumothorax. IMPRESSION: Stable cardiomegaly with mild central vascular congestion. No overt edema, consolidation or effusion. Atherosclerotic vascular disease of the aorta. Electronically Signed   By: Donavan Foil M.D.   On: 03/14/2016 23:41   Dg Chest 2 View  Result Date: 03/14/2016 CLINICAL DATA:  Congestion for 5 days  with productive cough, vomiting, nausea, right scapular pain, and right rib pain. EXAM: CHEST  2 VIEW COMPARISON:  09/07/2015 FINDINGS: Enlargement of the cardiac silhouette is unchanged. There is mild prominence of the central pulmonary arteries with decreased pulmonary vascular congestion compared to the prior study. There is no evidence confluent airspace opacity, edema, pleural effusion, or pneumothorax. No acute osseous abnormality is identified. IMPRESSION: Cardiomegaly with decreased pulmonary vascular congestion. No evidence of edema or pneumonia. Electronically Signed   By: Logan Bores M.D.   On: 03/14/2016 18:53   Ct Angio Chest Pe W And/or Wo Contrast  Result Date: 03/15/2016 CLINICAL DATA:  55 year old male with back pain and pleuritic chest pain. EXAM: CT ANGIOGRAPHY CHEST CT ABDOMEN AND PELVIS WITH CONTRAST TECHNIQUE: Multidetector CT imaging of the chest was performed using the standard protocol during bolus administration of intravenous  contrast. Multiplanar CT image reconstructions and MIPs were obtained to evaluate the vascular anatomy. Multidetector CT imaging of the abdomen and pelvis was performed using the standard protocol during bolus administration of intravenous contrast. CONTRAST:  80 cc Isovue 370 COMPARISON:  CT of the abdomen pelvis dated 09/07/2015 FINDINGS: CTA CHEST FINDINGS Cardiovascular: The thoracic aorta appears unremarkable. There is no aneurysmal dilatation or evidence of dissection. The origins of the great vessels of the aortic arch appear patent. There is non opacification of the left subclavian artery, likely related to timing of the contrast. There is mild dilatation of the main pulmonary trunk which may be represent a degree of pulmonary hypertension. Evaluation of the pulmonary arteries is limited due to respiratory motion artifact as well as streak artifact caused by patient's arms. No definite large central pulmonary artery embolus identified. There is moderate  cardiomegaly. Multi vessel coronary vascular disease. No pericardial effusion. Mediastinum/Nodes: There is no hilar or mediastinal adenopathy. The esophagus and the thyroid gland are grossly unremarkable. Lungs/Pleura: The lungs are clear. There is no pleural effusion or pneumothorax. The central airways are patent. Musculoskeletal: Degenerative changes of the spine. No acute fracture. Review of the MIP images confirms the above findings. CT ABDOMEN and PELVIS FINDINGS No intra-abdominal free air.  Small subhepatic free fluid. Hepatobiliary: A 9 mm left hepatic hypodense lesion is not well characterized but appears similar to prior CT and may represent a cyst or hemangioma. The liver is otherwise unremarkable. Mild intrahepatic biliary ductal dilatation. There is postsurgical changes of cholecystectomy. There is a small amount of fluid as well as inflammatory changes of the cholecystectomy bed. Although these may be postsurgical, findings are concerning for an infectious process or bile leak 1 month postoperative. Correlation with clinical exam recommended. A hepatobiliary scintigraphy may provide better evaluation and better assessment for possibility of bile leak. No discrete drainable fluid collection identified at this time. No retained CBD stone noted. Pancreas: Unremarkable. No pancreatic ductal dilatation or surrounding inflammatory changes. Spleen: Normal in size without focal abnormality. Adrenals/Urinary Tract: The adrenal glands appear unremarkable. Status post prior right nephrectomy. There is marked left renal atrophy. Innumerable left renal hypodense lesions are not well characterized on this CT but may represent cysts. MRI or ultrasound may provide better characterization. This findings is similar to prior CT. There is no hydronephrosis on the left. No stone identified. The visualized ureter appears unremarkable. The urinary bladder is collapsed. Stomach/Bowel: There is sigmoid diverticulosis without  active inflammatory changes. Scattered colonic diverticula in the proximal transverse as well as in the ascending colon noted. Mild haziness of the diverticula at the hepatic flexure, likely secondary to extension of inflammatory changes of the cholecystectomy. Diverticulitis is less likely. There is no evidence of bowel obstruction. Normal appendix. Vascular/Lymphatic: Mild aortoiliac atherosclerotic disease. There is no aneurysmal dilatation or evidence of dissection. The origins of the celiac axis, SMA, IMA appear patent. The IVC appears patent. No portal venous gas identified. The SMV, splenic vein, and main portal vein are patent. There is no adenopathy. Reproductive: The prostate and seminal vesicles are grossly unremarkable. Other: Small fat containing umbilical hernia. Right anterior abdominal wall hernia repair mesh. Musculoskeletal: Degenerative changes of the spine. No acute fracture. Review of the MIP images confirms the above findings. IMPRESSION: No CT evidence of aortic dissection or central pulmonary artery embolus. Inflammatory changes of the cholecystectomy with small amount of fluid in the subhepatic area concerning for an infectious process or bile leak. Correlation with clinical exam recommended. A  hepatobiliary scintigraphy may provide better evaluation and assistant for possible chronic. No drainable fluid collection. Colonic diverticulosis without active inflammatory changes. No evidence of bowel obstruction. Normal appendix. Electronically Signed   By: Anner Crete M.D.   On: 03/15/2016 03:35   Nm Hepatobiliary Liver Func  Result Date: 03/15/2016 CLINICAL DATA:  Cholecystectomy 02/12/2016. RIGHT-sided abdominal pain and back pain. EXAM: NUCLEAR MEDICINE HEPATOBILIARY IMAGING TECHNIQUE: Sequential images of the abdomen were obtained out to 60 minutes following intravenous administration of radiopharmaceutical. RADIOPHARMACEUTICALS:  5.0 mCi Tc-18m  Choletec IV COMPARISON:  CT  03/15/2016 FINDINGS: Uniform uptake of radiotracer within the liver. Counts are evident within the common bile duct and small bowel by 30 minutes. There is no evidence of extra biliary radiotracer to suggest bile leak. IMPRESSION: 1. No evidence of bile leak. 2. Patent common bile duct. Electronically Signed   By: Suzy Bouchard M.D.   On: 03/15/2016 11:15   Ct Abdomen Pelvis W Contrast  Result Date: 03/15/2016 CLINICAL DATA:  56 year old male with back pain and pleuritic chest pain. EXAM: CT ANGIOGRAPHY CHEST CT ABDOMEN AND PELVIS WITH CONTRAST TECHNIQUE: Multidetector CT imaging of the chest was performed using the standard protocol during bolus administration of intravenous contrast. Multiplanar CT image reconstructions and MIPs were obtained to evaluate the vascular anatomy. Multidetector CT imaging of the abdomen and pelvis was performed using the standard protocol during bolus administration of intravenous contrast. CONTRAST:  80 cc Isovue 370 COMPARISON:  CT of the abdomen pelvis dated 09/07/2015 FINDINGS: CTA CHEST FINDINGS Cardiovascular: The thoracic aorta appears unremarkable. There is no aneurysmal dilatation or evidence of dissection. The origins of the great vessels of the aortic arch appear patent. There is non opacification of the left subclavian artery, likely related to timing of the contrast. There is mild dilatation of the main pulmonary trunk which may be represent a degree of pulmonary hypertension. Evaluation of the pulmonary arteries is limited due to respiratory motion artifact as well as streak artifact caused by patient's arms. No definite large central pulmonary artery embolus identified. There is moderate cardiomegaly. Multi vessel coronary vascular disease. No pericardial effusion. Mediastinum/Nodes: There is no hilar or mediastinal adenopathy. The esophagus and the thyroid gland are grossly unremarkable. Lungs/Pleura: The lungs are clear. There is no pleural effusion or  pneumothorax. The central airways are patent. Musculoskeletal: Degenerative changes of the spine. No acute fracture. Review of the MIP images confirms the above findings. CT ABDOMEN and PELVIS FINDINGS No intra-abdominal free air.  Small subhepatic free fluid. Hepatobiliary: A 9 mm left hepatic hypodense lesion is not well characterized but appears similar to prior CT and may represent a cyst or hemangioma. The liver is otherwise unremarkable. Mild intrahepatic biliary ductal dilatation. There is postsurgical changes of cholecystectomy. There is a small amount of fluid as well as inflammatory changes of the cholecystectomy bed. Although these may be postsurgical, findings are concerning for an infectious process or bile leak 1 month postoperative. Correlation with clinical exam recommended. A hepatobiliary scintigraphy may provide better evaluation and better assessment for possibility of bile leak. No discrete drainable fluid collection identified at this time. No retained CBD stone noted. Pancreas: Unremarkable. No pancreatic ductal dilatation or surrounding inflammatory changes. Spleen: Normal in size without focal abnormality. Adrenals/Urinary Tract: The adrenal glands appear unremarkable. Status post prior right nephrectomy. There is marked left renal atrophy. Innumerable left renal hypodense lesions are not well characterized on this CT but may represent cysts. MRI or ultrasound may provide better characterization. This findings  is similar to prior CT. There is no hydronephrosis on the left. No stone identified. The visualized ureter appears unremarkable. The urinary bladder is collapsed. Stomach/Bowel: There is sigmoid diverticulosis without active inflammatory changes. Scattered colonic diverticula in the proximal transverse as well as in the ascending colon noted. Mild haziness of the diverticula at the hepatic flexure, likely secondary to extension of inflammatory changes of the cholecystectomy.  Diverticulitis is less likely. There is no evidence of bowel obstruction. Normal appendix. Vascular/Lymphatic: Mild aortoiliac atherosclerotic disease. There is no aneurysmal dilatation or evidence of dissection. The origins of the celiac axis, SMA, IMA appear patent. The IVC appears patent. No portal venous gas identified. The SMV, splenic vein, and main portal vein are patent. There is no adenopathy. Reproductive: The prostate and seminal vesicles are grossly unremarkable. Other: Small fat containing umbilical hernia. Right anterior abdominal wall hernia repair mesh. Musculoskeletal: Degenerative changes of the spine. No acute fracture. Review of the MIP images confirms the above findings. IMPRESSION: No CT evidence of aortic dissection or central pulmonary artery embolus. Inflammatory changes of the cholecystectomy with small amount of fluid in the subhepatic area concerning for an infectious process or bile leak. Correlation with clinical exam recommended. A hepatobiliary scintigraphy may provide better evaluation and assistant for possible chronic. No drainable fluid collection. Colonic diverticulosis without active inflammatory changes. No evidence of bowel obstruction. Normal appendix. Electronically Signed   By: Anner Crete M.D.   On: 03/15/2016 03:35    Medications:   . allopurinol  150 mg Oral Daily  . amiodarone  100 mg Oral Daily  . amLODipine  5 mg Oral Daily  . carvedilol  12.5 mg Oral Once per day on Mon Wed Fri  . carvedilol  12.5 mg Oral 2 times per day on Sun Tue Thu Sat  . cinacalcet  90 mg Oral Q breakfast  . isosorbide dinitrate  20 mg Oral Daily  . pantoprazole  40 mg Oral BID  . sevelamer carbonate  2,400 mg Oral TID WC  . sodium chloride flush  3 mL Intravenous Q12H   Continuous Infusions: . heparin 1,200 Units/hr (03/16/16 0026)    Patient is at moderate risk for deterioration and this is a moderate complexity visit, level II.    LOS: 1 day    RAMA,CHRISTINA  [facility] Pager [phone]. If unable to reach me by pager, please call my cell phone at [phone].  *Please refer to [email or web address], password [email or web address] to get updated schedule on who will round on this patient, as hospitalists switch teams weekly. If 7PM-7AM, please contact night-coverage at [email or web address], password [email or web address] for any overnight needs.  03/16/2016, 8:38 AM

## 2016-03-16 NOTE — Interval H&P Note (Signed)
History and Physical Interval Note:  03/16/2016 10:23 AM  [name]  has presented today for surgery, with the diagnosis of abnormal trip - cp  The various methods of treatment have been discussed with the patient and family. After consideration of risks, benefits and other options for treatment, the patient has consented to  Procedure(s): Left Heart Cath and Coronary Angiography (N/A) as a surgical intervention .  The patient's history has been reviewed, patient examined, no change in status, stable for surgery.  I have reviewed the patient's chart and labs.  Questions were answered to the patient's satisfaction.     Karren Newland S

## 2016-03-16 NOTE — Progress Notes (Signed)
ANTICOAGULATION CONSULT NOTE - Follow Up Consult  Pharmacy Consult for heparin Indication: atrial fibrillation and r/o ACS  Labs:  Recent Labs  03/14/16 2214  03/15/16 0437 03/15/16 1035 03/15/16 1321 03/15/16 1606 03/15/16 1607 03/15/16 2218  HGB 11.3*  --   --   --   --   --  11.0*  --   HCT 34.6*  --   --   --   --   --  33.4*  --   PLT 144*  --   --   --   --   --  140*  --   LABPROT 13.6  --   --   --   --   --   --   --   INR 1.04  --   --   --   --   --   --   --   HEPARINUNFRC  --   --   --   --  0.80*  --   --  0.82*  CREATININE 13.40*  --   --   --   --  15.23*  --   --   TROPONINI 0.22*  < > 0.22* 0.24*  --   --  0.21*  --   < > = values in this interval not displayed.   Assessment: 55yo male remains above goal on heparin w/ higher heparin level despite decreased rate.  Goal of Therapy:  Heparin level 0.3-0.7 units/ml   Plan:  Will decrease heparin gtt by 2 units/kg/hr to 1200 units/hr and check level in [place], PharmD, BCPS  03/16/2016,12:13 AM

## 2016-03-17 ENCOUNTER — Inpatient Hospital Stay (HOSPITAL_COMMUNITY): Payer: Medicare Other

## 2016-03-17 DIAGNOSIS — R0781 Pleurodynia: Secondary | ICD-10-CM

## 2016-03-17 DIAGNOSIS — R791 Abnormal coagulation profile: Secondary | ICD-10-CM

## 2016-03-17 DIAGNOSIS — E871 Hypo-osmolality and hyponatremia: Secondary | ICD-10-CM

## 2016-03-17 DIAGNOSIS — Z992 Dependence on renal dialysis: Secondary | ICD-10-CM

## 2016-03-17 DIAGNOSIS — I4892 Unspecified atrial flutter: Secondary | ICD-10-CM

## 2016-03-17 DIAGNOSIS — N186 End stage renal disease: Secondary | ICD-10-CM

## 2016-03-17 DIAGNOSIS — E1122 Type 2 diabetes mellitus with diabetic chronic kidney disease: Secondary | ICD-10-CM

## 2016-03-17 DIAGNOSIS — I10 Essential (primary) hypertension: Secondary | ICD-10-CM

## 2016-03-17 DIAGNOSIS — R1011 Right upper quadrant pain: Principal | ICD-10-CM

## 2016-03-17 DIAGNOSIS — E875 Hyperkalemia: Secondary | ICD-10-CM

## 2016-03-17 DIAGNOSIS — R748 Abnormal levels of other serum enzymes: Secondary | ICD-10-CM

## 2016-03-17 LAB — RENAL FUNCTION PANEL
Albumin: 3.2 g/dL — ABNORMAL LOW (ref 3.5–5.0)
Anion gap: 13 (ref 5–15)
BUN: 43 mg/dL — ABNORMAL HIGH (ref 6–20)
CO2: 24 mmol/L (ref 22–32)
Calcium: 8.3 mg/dL — ABNORMAL LOW (ref 8.9–10.3)
Chloride: 96 mmol/L — ABNORMAL LOW (ref 101–111)
Creatinine, Ser: 12.14 mg/dL — ABNORMAL HIGH (ref 0.61–1.24)
GFR calc Af Amer: 5 mL/min — ABNORMAL LOW (ref >60)
GFR calc non Af Amer: 4 mL/min — ABNORMAL LOW (ref >60)
Glucose, Bld: 96 mg/dL (ref 65–99)
Phosphorus: 5.7 mg/dL — ABNORMAL HIGH (ref 2.5–4.6)
Potassium: 5.2 mmol/L — ABNORMAL HIGH (ref 3.5–5.1)
Sodium: 133 mmol/L — ABNORMAL LOW (ref 135–145)

## 2016-03-17 LAB — CBC
HCT: 33.7 % — ABNORMAL LOW (ref 39.0–52.0)
Hemoglobin: 10.9 g/dL — ABNORMAL LOW (ref 13.0–17.0)
MCH: 31.1 pg (ref 26.0–34.0)
MCHC: 32.3 g/dL (ref 30.0–36.0)
MCV: 96.3 fL (ref 78.0–100.0)
Platelets: 139 10*3/uL — ABNORMAL LOW (ref 150–400)
RBC: 3.5 MIL/uL — ABNORMAL LOW (ref 4.22–5.81)
RDW: 15.5 % (ref 11.5–15.5)
WBC: 4.1 10*3/uL (ref 4.0–10.5)

## 2016-03-17 LAB — HEPARIN LEVEL (UNFRACTIONATED)
Heparin Unfractionated: 0.25 IU/mL — ABNORMAL LOW (ref 0.30–0.70)
Heparin Unfractionated: 0.46 IU/mL (ref 0.30–0.70)

## 2016-03-17 MED ORDER — LIDOCAINE HCL (PF) 1 % IJ SOLN: 5.0000 mL | INTRAMUSCULAR | Status: DC | PRN

## 2016-03-17 MED ORDER — HEPARIN SODIUM (PORCINE) 1000 UNIT/ML DIALYSIS
100.0000 [IU]/kg | INTRAMUSCULAR | Status: DC | PRN
  Filled 2016-03-17: qty 11

## 2016-03-17 MED ORDER — SODIUM CHLORIDE 0.9 % IV SOLN: 100.0000 mL | INTRAVENOUS | Status: DC | PRN

## 2016-03-17 MED ORDER — ALTEPLASE 2 MG IJ SOLR: 2.0000 mg | Freq: Once | INTRAMUSCULAR | Status: DC | PRN

## 2016-03-17 MED ORDER — RENA-VITE PO TABS
1.0000 | ORAL_TABLET | Freq: Every day | ORAL | Status: DC
  Administered 2016-03-17 – 2016-03-20 (×4): 1 via ORAL
  Filled 2016-03-17 (×4): qty 1

## 2016-03-17 MED ORDER — LIDOCAINE-PRILOCAINE 2.5-2.5 % EX CREA
1.0000 "application " | TOPICAL_CREAM | CUTANEOUS | Status: DC | PRN
  Filled 2016-03-17: qty 5

## 2016-03-17 MED ORDER — PENTAFLUOROPROP-TETRAFLUOROETH EX AERO
1.0000 "application " | INHALATION_SPRAY | CUTANEOUS | Status: DC | PRN
  Filled 2016-03-17: qty 30

## 2016-03-17 MED ORDER — HEPARIN SODIUM (PORCINE) 1000 UNIT/ML DIALYSIS
1000.0000 [IU] | INTRAMUSCULAR | Status: DC | PRN
  Filled 2016-03-17: qty 1

## 2016-03-17 MED ORDER — PENTAFLUOROPROP-TETRAFLUOROETH EX AERO
INHALATION_SPRAY | CUTANEOUS | Status: AC
  Filled 2016-03-17: qty 103.5

## 2016-03-17 NOTE — Progress Notes (Signed)
[facility] Progress Note  Dialysis Orders:  [place] MWF  4h 180 F 2k/2.25Ca 450/800 EDW 109kg Hectorol 44mcg IV q week  Heparin 7000 U IV q treatment OP Labs: Hgb 11.6 Ca 9.7 P 4.6 iPTH 207  Assessment/Plan: 1. Abdominal pain s/p cholecystectomy 02/12/16 - per primary N/V improving HIDA scan neg for bile leakage  Repeat lipase 35 Concern of intermittent SBO related to lysis of adhesions 2. Chest pain w elevated troponin - w/u per cards s/p cardiac cath - no signif CAD but low EF 3. ESRD - Cont HD on MWF lowering EDW  4. Anemia - hgb 10.9 not on ESA start 5. Metabolic bone disease - Ca 8.3 P 5.7 on VDRA cont Renvela/Sensipar 6. HTN/volume - BP ^ with volume for HD today with UF goal 4L - cont home meds hopefully lower and help low EF 7. Nutrition - Albumin 3.3 protein supp/renal vitamin  8. DM II - per primary  9. Aflutter - on coumadin  10. Obesity   Ogechi Larina Earthly PA-C [facility] Pager [phone] 03/17/2016,7:56 AM  LOS: 2 days  I have seen and examined this patient and agree with the plan of care seen, eval, examined, plan discussed with patient and PA .  Vaneza Pickart L 03/17/2016, 8:40 AM   Subjective: Seen on HD Abdominal pain/nausea improved, solid BM this am for first time in a week. No c/os   Objective Vitals:   03/17/16 0553 03/17/16 0639 03/17/16 0647 03/17/16 0700  BP: (!) 153/90 (!) 189/101 (!) 180/93 (!) 177/92  Pulse: 67 72 69 68  Resp: 20 13    Temp: 97.6 F (36.4 C) 97.7 F (36.5 C)    TempSrc: Oral Oral    SpO2: 97%     Weight: 112.1 kg (247 lb 2.2 oz) 112.6 kg (248 lb 3.8 oz)    Height:       Physical Exam General: WNWD AAM NAD  Heart: RRR systolic ejection murmur Lungs: breathing unlabored CTAB Abdomen: soft ND mild diffuse tenderness not localized liver down 5 cm Extremities: trace LE edema  Dialysis Access: LUE AVF cannulated on HD  Additional Objective Labs: Basic Metabolic Panel:  Recent Labs Lab  03/15/16 1606 03/16/16 1228 03/17/16 0659  NA 133* 134* 133*  K 6.1* 4.9 5.2*  CL 99* 98* 96*  CO2 24 28 24   GLUCOSE 102* 90 96  BUN 60* 32* 43*  CREATININE 15.23* 10.49* 12.14*  CALCIUM 7.8* 8.4* 8.3*  PHOS 4.1 4.9* 5.7*   Liver Function Tests:  Recent Labs Lab 03/14/16 2214 03/15/16 1606 03/16/16 1228 03/17/16 0659  AST 13*  --   --   --   ALT 12*  --   --   --   ALKPHOS 101  --   --   --   BILITOT 0.9  --   --   --   PROT 7.3  --   --   --   ALBUMIN 3.4* 3.1* 3.3* 3.2*    Recent Labs Lab 03/14/16 2214 03/16/16 1228  LIPASE 125* 35   CBC:  Recent Labs Lab 03/14/16 2214 03/15/16 1607 03/17/16 0659  WBC 4.7 3.9* 4.1  NEUTROABS 2.9  --   --   HGB 11.3* 11.0* 10.9*  HCT 34.6* 33.4* 33.7*  MCV 96.4 95.4 96.3  PLT 144* 140* 139*   Blood Culture    Component Value Date/Time   SDES URINE, RANDOM 01/18/2011 0758   SPECREQUEST NONE 01/18/2011 0758   CULT NO  GROWTH 01/18/2011 0758   REPTSTATUS 01/19/2011 FINAL 01/18/2011 0758    Cardiac Enzymes:  Recent Labs Lab 03/14/16 2214 03/15/16 0119 03/15/16 0437 03/15/16 1035 03/15/16 1607  TROPONINI 0.22* 0.21* 0.22* 0.24* 0.21*   CBG: No results for input(s): GLUCAP in the last 168 hours. Iron Studies: No results for input(s): IRON, TIBC, TRANSFERRIN, FERRITIN in the last 72 hours. Lab Results  Component Value Date   INR 1.11 03/16/2016   INR 1.04 03/14/2016   INR 1.00 02/12/2016   Medications: . heparin 1,300 Units/hr (03/17/16 0500)   . allopurinol  150 mg Oral Daily  . amiodarone  100 mg Oral Daily  . amLODipine  5 mg Oral Daily  . carvedilol  12.5 mg Oral Once per day on Mon Wed Fri  . carvedilol  12.5 mg Oral 2 times per day on Sun Tue Thu Sat  . cinacalcet  90 mg Oral Q breakfast  . feeding supplement (NEPRO CARB STEADY)  237 mL Oral BID BM  . isosorbide dinitrate  20 mg Oral Daily  . pantoprazole  40 mg Oral BID  . pentafluoroprop-tetrafluoroeth      . sevelamer carbonate  2,400 mg  Oral TID WC  . sodium chloride flush  3 mL Intravenous Q12H  . sodium chloride flush  3 mL Intravenous Q12H  . warfarin  10 mg Oral q1800  . Warfarin - Physician Dosing Inpatient   Does not apply [phone]

## 2016-03-17 NOTE — Progress Notes (Signed)
PROGRESS NOTE    [name]  XTK:[id number] DOB: 05/13/1961 DOA: 03/14/2016 PCP: Maggie Font, MD  Brief Narrative:  [name] is an 55 y.o. male with a PMH of ESRD secondary to HTN with solitary kidney on HD MWF, biliary dyskinesia s/p laparoscopic lysis of adhesions and cholecystectomy with cholangiogram 02/12/16, atrial flutter on chronic coumadin (although INR low on admission, raising question of compliance), DM II, Obesity, HCC s/p right nephrectomy, chronic systolic CHF with an EF of 20-25% (per Echo 06/2015) and dilated non-ischemic cardiomyopathy who was admitted 03/14/16 with chief complaint of right upper quadrant abdominal pain and lower central chest pain. In the ED, his troponin was elevated at 0.2 to and a CT scan of his abdomen showed fluid in the subhepatic area with inflammation concerning for infection versus bile leak. General Surgery was consulted and evaluated and felt like his pain was not 2/2 to a bile leak. Cardiology was consulted because of CP and patient underwent Cardiac Catheterization.   Assessment & Plan:   Principal Problem:   Right upper quadrant abdominal pain Active Problems:   Hypertension, benign   DM II (diabetes mellitus, type II), controlled (HCC)   ESRD (end stage renal disease) on dialysis (HCC)   Bile leak, postoperative   Elevated troponin   Rib pain on right side  RUQ Pain/Lower Chest Pain r/o'd postoperative bile leak and SBO -General Surgery Consulted and signed off as HIDA scan negative for bile leak.  -Lipase also elevated 10/22 but trended down to 35. Pain may be pleuritic given cough and pleuritic quality. -Abdomen and Pelvis Flat Plate showed No acute findings. No evidence of bowel obstruction. -Pain improving. Has meds for pain. Continue to Moniotor  Dilated Cardiomyopathy with prior MI -EF on Cardiac Cath was 25-35% by Visual Estimate (20-25% on Echo 06/2015) -Cw Isordil 20 mg po Daily, Carvedilol 12.5 mg po BID, Amlodipine 10 mg  po Daily -No ACE/ARB because patient is allergic -Appreciate Additional Cardiac Recc's -Continue with Dialysis to try to obtain EDW  Hypertension -Continue to Monitor Vital Signs -Continue Norvasc, Coreg and Isordil.  DM Mellitus Type 2 Diet controlled.  Mild Hyperkalemia -Should correct with today's dialysis session -Repeat BMP in AM  Mild Hyponatremia -Patient's Na+ was 133 this AM -Repeat in AM  Anemia of Chronic Disease 2/2 to ESRD -Hb/Hct went from 11.0/33.4 -> 10.9/33.7 -Continue to Monitor CBC  ESRD on HD MWF -BUN/Cr was 43/12.14 this AM -Renal Diet with 1200 mL Fluid Restriction -Dialyzed Today and Nephro following -Continue Sensipar 90 mg po daily and Renvela 2,400 mg po TID.  Elevated Troponin r/o'd ACS likely 2/2 to ESRD -Troponin mildly elevated but flat at 0.21 -> 0.22 -> 0.24 -> 0.21 -C/w Isordil 20 mg po Daily and with NG SL 0.4 mg q41minprn -Spoke with the patient's cardiologist, Dr. Doylene Canard who took him for cardiac catheterization today yesterday.   -Cardiac Catheterization showed  Prox LAD lesion, 25 % stenosed.  There is moderate left ventricular systolic dysfunction.  LV end diastolic pressure is normal.  The left ventricular ejection fraction is 25-35% by visual estimate.  There is trivial (1+) mitral regurgitation.  -Continue IV heparin for now. -Continue Coumadin (dosed per Dr. Doylene Canard) with Heparin gtt as patient's INR is subtherapeutic  Atrial Flutter/Atrial Fibrillation -Continue with Amiodarone 100 mg po Daily and with Carvediolol 12.5 mg po BID -Anticoagulated with Heparin gtt and with Warfarin Dosing per Dr. Doylene Canard -**INR is currently Subtherapeutic at 1.11 and was subtherapeutic on admission  Gout -  Continue Allopurinol 150 mg po Daily  DVT prophylaxis: Coumadin with Heparin gtt for bridging Code Status: Full Family Communication: No Family at Bedside Disposition Plan: Home at D/C.  Consultants:  Cardiology General  Surgery Nephrology  Procedures: Hemodialysis today. Cardiac Catheterization   Antimicrobials: None  Subjective: Patient was seen and examined after Dialysis and he was doing well and hungry. Had no complaints and states his abdominal Pain/Chest Pain were better. No N/V or lightheadedness. Tolerated Dialysis well. No other active complaints or concerns at this time.   Objective: Vitals:   03/17/16 1000 03/17/16 1030 03/17/16 1107 03/17/16 1146  BP: (!) 143/81 133/76 135/83   Pulse: 79 79 79   Resp:   (!) 76   Temp:   97.2 F (36.2 C)   TempSrc:   Oral   SpO2:   98%   Weight:    107.8 kg (237 lb 10.5 oz)  Height:        Intake/Output Summary (Last 24 hours) at 03/17/16 1210 Last data filed at 03/17/16 1107  Gross per 24 hour  Intake          1184.75 ml  Output             4500 ml  Net         -3315.25 ml   Filed Weights   03/17/16 0553 03/17/16 0639 03/17/16 1146  Weight: 112.1 kg (247 lb 2.2 oz) 112.6 kg (248 lb 3.8 oz) 107.8 kg (237 lb 10.5 oz)    Examination: Physical Exam:  Constitutional: WN/WD, NAD and appears calm and comfortable Eyes: Lids and conjunctivae normal, sclerae anicteric  ENMT: External Ears, Nose appear normal. Grossly normal hearing. Mucous membranes are moist.  Neck: Appears normal, supple, no cervical masses, normal ROM, no appreciable thyromegaly Respiratory: Clear to auscultation bilaterally, no wheezing, rales, rhonchi or crackles. Normal respiratory effort and patient is not tachypenic. No accessory muscle use.  Cardiovascular: RRR, no murmurs / rubs / gallops. S1 and S2 auscultated. No extremity edema. 2+ pedal pulses. No carotid bruits.  Abdomen: Soft, mildly tender to palpation, distended 2/2 body habitus. No masses palpated. No appreciable hepatosplenomegaly. Bowel sounds positive.  GU: Deferred. Musculoskeletal: No clubbing / cyanosis of digits/nails. No joint deformity upper and lower extremities. Good ROM, no contractures.  Skin: No  rashes, lesions, ulcers. No induration; Warm and dry. Abdominal scars noted from cholecystectomy. Left Upper Arm AVF had good thrill and auscultated bruit.  Neurologic: CN 2-12 grossly intact with no focal deficits. Sensation intact in all 4 Extremities. Romberg sign cerebellar reflexes not assessed.  Psychiatric: Normal judgment and insight. Alert and oriented x 3. Normal mood and appropriate affect.   Data Reviewed: I have personally reviewed following labs and imaging studies  CBC:  Recent Labs Lab 03/14/16 2214 03/15/16 1607 03/17/16 0659  WBC 4.7 3.9* 4.1  NEUTROABS 2.9  --   --   HGB 11.3* 11.0* 10.9*  HCT 34.6* 33.4* 33.7*  MCV 96.4 95.4 96.3  PLT 144* 140* 387*   Basic Metabolic Panel:  Recent Labs Lab 03/14/16 2214 03/15/16 1606 03/16/16 1228 03/17/16 0659  NA 136 133* 134* 133*  K 4.6 6.1* 4.9 5.2*  CL 100* 99* 98* 96*  CO2 25 24 28 24   GLUCOSE 98 102* 90 96  BUN 50* 60* 32* 43*  CREATININE 13.40* 15.23* 10.49* 12.14*  CALCIUM 8.1* 7.8* 8.4* 8.3*  PHOS  --  4.1 4.9* 5.7*   GFR: Estimated Creatinine Clearance: 9.2 mL/min (by C-G formula  based on SCr of 12.14 mg/dL (H)). Liver Function Tests:  Recent Labs Lab 03/14/16 2214 03/15/16 1606 03/16/16 1228 03/17/16 0659  AST 13*  --   --   --   ALT 12*  --   --   --   ALKPHOS 101  --   --   --   BILITOT 0.9  --   --   --   PROT 7.3  --   --   --   ALBUMIN 3.4* 3.1* 3.3* 3.2*    Recent Labs Lab 03/14/16 2214 03/16/16 1228  LIPASE 125* 35   No results for input(s): AMMONIA in the last 168 hours. Coagulation Profile:  Recent Labs Lab 03/14/16 2214 03/16/16 0853  INR 1.04 1.11   Cardiac Enzymes:  Recent Labs Lab 03/14/16 2214 03/15/16 0119 03/15/16 0437 03/15/16 1035 03/15/16 1607  TROPONINI 0.22* 0.21* 0.22* 0.24* 0.21*   BNP (last 3 results) No results for input(s): PROBNP in the last 8760 hours. HbA1C: No results for input(s): HGBA1C in the last 72 hours. CBG: No results for  input(s): GLUCAP in the last 168 hours. Lipid Profile: No results for input(s): CHOL, HDL, LDLCALC, TRIG, CHOLHDL, LDLDIRECT in the last 72 hours. Thyroid Function Tests: No results for input(s): TSH, T4TOTAL, FREET4, T3FREE, THYROIDAB in the last 72 hours. Anemia Panel: No results for input(s): VITAMINB12, FOLATE, FERRITIN, TIBC, IRON, RETICCTPCT in the last 72 hours. Sepsis Labs: No results for input(s): PROCALCITON, LATICACIDVEN in the last 168 hours.  Recent Results (from the past 240 hour(s))  MRSA PCR Screening     Status: None   Collection Time: 03/16/16  2:50 AM  Result Value Ref Range Status   MRSA by PCR NEGATIVE NEGATIVE Final    Comment:        The GeneXpert MRSA Assay (FDA approved for NASAL specimens only), is one component of a comprehensive MRSA colonization surveillance program. It is not intended to diagnose MRSA infection nor to guide or monitor treatment for MRSA infections.     Radiology Studies: Dg Abd 1 View  Result Date: 03/17/2016 CLINICAL DATA:  Abdominal pain all over. History of hernia, diverticulitis and nephrectomy and renal insufficiency. EXAM: ABDOMEN - 1 VIEW COMPARISON:  CT, 03/15/2016 FINDINGS: There is no bowel dilation to suggest obstruction or generalized adynamic ileus. There multiple surgical vascular clips in the right upper quadrant from a prior cholecystectomy and right nephrectomy. Soft tissues are otherwise unremarkable. IMPRESSION: 1. No acute findings.  No evidence of bowel obstruction. Electronically Signed   By: Lajean Manes M.D.   On: 03/17/2016 17:09   Scheduled Meds: . allopurinol  150 mg Oral Daily  . amiodarone  100 mg Oral Daily  . amLODipine  5 mg Oral Daily  . carvedilol  12.5 mg Oral Once per day on Mon Wed Fri  . carvedilol  12.5 mg Oral 2 times per day on Sun Tue Thu Sat  . cinacalcet  90 mg Oral Q breakfast  . feeding supplement (NEPRO CARB STEADY)  237 mL Oral BID BM  . isosorbide dinitrate  20 mg Oral Daily  .  multivitamin  1 tablet Oral QHS  . pantoprazole  40 mg Oral BID  . pentafluoroprop-tetrafluoroeth      . sevelamer carbonate  2,400 mg Oral TID WC  . sodium chloride flush  3 mL Intravenous Q12H  . sodium chloride flush  3 mL Intravenous Q12H  . warfarin  10 mg Oral q1800  . Warfarin - Physician Dosing Inpatient  Does not apply q1800   Continuous Infusions: . heparin 1,300 Units/hr (03/17/16 0500)     LOS: 2 days   Kerney Elbe, DO [facility] Pager [phone]  If 7PM-7AM, please contact night-coverage [email or web address] Password [email or web address] 03/17/2016, 12:10 PM

## 2016-03-17 NOTE — Progress Notes (Signed)
ANTICOAGULATION CONSULT NOTE - Follow Up Consult  Pharmacy Consult for Heparin  Indication: chest pain/ACS and atrial fibrillation, s/p cath  Allergies  Allergen Reactions  . Ace Inhibitors Itching and Cough    Patient Measurements: Height: 6\' 1"  (185.4 cm) Weight: 237 lb 10.5 oz (107.8 kg) (stood to scale ) IBW/kg (Calculated) : 79.9  Vital Signs: Temp: 97.2 F (36.2 C) (10/25 1107) Temp Source: Oral (10/25 1107) BP: 103/59 (10/25 1341) Pulse Rate: 86 (10/25 1338)  Labs:  Recent Labs  03/14/16 2214  03/15/16 0437 03/15/16 1035  03/15/16 1606 03/15/16 1607  03/16/16 0853 03/16/16 1228 03/17/16 0100 03/17/16 0659 03/17/16 1201  HGB 11.3*  --   --   --   --   --  11.0*  --   --   --   --  10.9*  --   HCT 34.6*  --   --   --   --   --  33.4*  --   --   --   --  33.7*  --   PLT 144*  --   --   --   --   --  140*  --   --   --   --  139*  --   LABPROT 13.6  --   --   --   --   --   --   --  14.3  --   --   --   --   INR 1.04  --   --   --   --   --   --   --  1.11  --   --   --   --   HEPARINUNFRC  --   --   --   --   < >  --   --   < > 0.32  --  0.25*  --  0.46  CREATININE 13.40*  --   --   --   --  15.23*  --   --   --  10.49*  --  12.14*  --   TROPONINI 0.22*  < > 0.22* 0.24*  --   --  0.21*  --   --   --   --   --   --   < > = values in this interval not displayed.  Estimated Creatinine Clearance: 9.2 mL/min (by C-G formula based on SCr of 12.14 mg/dL (H)).   Assessment: Continues on heparin S/P cath 10/24. Coumadin PTA for Afib but INR on admission 1.04. Last dose per pt 10/22. Coumadin resumed 10/24 per MD dosing - INR 1.11  HL therapeutic at 0.46. CBC low stable - Hgb 10.9, plt 139. ESRD on HD MWF.   Home dose: 9mg  daily   Goal of Therapy:  Heparin level 0.3-0.7 units/ml Monitor platelets by anticoagulation protocol: Yes   Plan:  -Continue heparin at 1300 units/hr -Coumadin per MD -Daily HL -Monitor CBC, s/sx bleeding  Stephens November, PharmD Clinical  Pharmacist 2:00 PM, 03/17/2016

## 2016-03-17 NOTE — Progress Notes (Signed)
ANTICOAGULATION CONSULT NOTE - Follow Up Consult  Pharmacy Consult for Heparin  Indication: chest pain/ACS and atrial fibrillation, s/p cath  Allergies  Allergen Reactions  . Ace Inhibitors Itching and Cough    Patient Measurements: Height: 6\' 1"  (185.4 cm) Weight: 241 lb 11.2 oz (109.6 kg) IBW/kg (Calculated) : 79.9  Vital Signs: Temp: 97.4 F (36.3 C) (10/24 1946) Temp Source: Oral (10/24 1946) BP: 123/78 (10/24 1946) Pulse Rate: 67 (10/24 1946)  Labs:  Recent Labs  03/14/16 2214  03/15/16 0437 03/15/16 1035  03/15/16 1606 03/15/16 1607 03/15/16 2218 03/16/16 0853 03/16/16 1228 03/17/16 0100  HGB 11.3*  --   --   --   --   --  11.0*  --   --   --   --   HCT 34.6*  --   --   --   --   --  33.4*  --   --   --   --   PLT 144*  --   --   --   --   --  140*  --   --   --   --   LABPROT 13.6  --   --   --   --   --   --   --  14.3  --   --   INR 1.04  --   --   --   --   --   --   --  1.11  --   --   HEPARINUNFRC  --   --   --   --   < >  --   --  0.82* 0.32  --  0.25*  CREATININE 13.40*  --   --   --   --  15.23*  --   --   --  10.49*  --   TROPONINI 0.22*  < > 0.22* 0.24*  --   --  0.21*  --   --   --   --   < > = values in this interval not displayed.  Estimated Creatinine Clearance: 10.7 mL/min (by C-G formula based on SCr of 10.49 mg/dL (H)).   Assessment: Heparin level sub-therapeutic after re-start s/p cath, no issues per RN.   Goal of Therapy:  Heparin level 0.3-0.7 units/ml Monitor platelets by anticoagulation protocol: Yes   Plan:  -Inc heparin to 1300 units/hr -1000 HL  Trishelle Devora 03/17/2016,1:37 AM

## 2016-03-17 NOTE — Progress Notes (Signed)
Ref: Maggie Font, MD   Subjective:  No chest pain. Aware of dilated cardiomyopathy. Mostly active life-style per wife.  Objective:  Vital Signs in the last 24 hours: Temp:  [97.4 F (36.3 C)-97.8 F (36.6 C)] 97.7 F (36.5 C) (10/25 0639) Pulse Rate:  [65-81] 73 (10/25 0900) Cardiac Rhythm: Normal sinus rhythm (10/25 0700) Resp:  [12-21] 13 (10/25 0639) BP: (121-189)/(55-101) 121/74 (10/25 0900) SpO2:  [97 %-100 %] 98 % (10/25 0639) Weight:  [112.1 kg (247 lb 2.2 oz)-112.6 kg (248 lb 3.8 oz)] 112.6 kg (248 lb 3.8 oz) (10/25 6283)  Physical Exam: BP Readings from Last 1 Encounters:  03/17/16 121/74    Wt Readings from Last 1 Encounters:  03/17/16 112.6 kg (248 lb 3.8 oz)    Weight change: -1.8 kg (-3 lb 15.5 oz) Body mass index is 32.75 kg/m. HEENT: [place]/AT, Eyes-Brown, PERL, EOMI, Conjunctiva-Pink, Sclera-Non-icteric Neck: No JVD, No bruit, Trachea midline. Lungs:  Clear, Bilateral. Cardiac:  Regular rhythm, normal S1 and S2, no S3. II/VI systolic murmur. Abdomen:  Soft, non-tender. Extremities:  No edema present. No cyanosis. No clubbing. Left UE with AVF. No right groin hematoma. CNS: AxOx3, Cranial nerves grossly intact, moves all 4 extremities. Right handed. Skin: Warm and dry.   Intake/Output from previous day: 10/24 0701 - 10/25 0700 In: 1184.8 [P.O.:960; I.V.:224.8] Out: 0     Lab Results: BMET    Component Value Date/Time   NA 133 (L) 03/17/2016 0659   NA 134 (L) 03/16/2016 1228   NA 133 (L) 03/15/2016 1606   K 5.2 (H) 03/17/2016 0659   K 4.9 03/16/2016 1228   K 6.1 (H) 03/15/2016 1606   CL 96 (L) 03/17/2016 0659   CL 98 (L) 03/16/2016 1228   CL 99 (L) 03/15/2016 1606   CO2 24 03/17/2016 0659   CO2 28 03/16/2016 1228   CO2 24 03/15/2016 1606   GLUCOSE 96 03/17/2016 0659   GLUCOSE 90 03/16/2016 1228   GLUCOSE 102 (H) 03/15/2016 1606   BUN 43 (H) 03/17/2016 0659   BUN 32 (H) 03/16/2016 1228   BUN 60 (H) 03/15/2016 1606   CREATININE 12.14 (H)  03/17/2016 0659   CREATININE 10.49 (H) 03/16/2016 1228   CREATININE 15.23 (H) 03/15/2016 1606   CALCIUM 8.3 (L) 03/17/2016 0659   CALCIUM 8.4 (L) 03/16/2016 1228   CALCIUM 7.8 (L) 03/15/2016 1606   GFRNONAA 4 (L) 03/17/2016 0659   GFRNONAA 5 (L) 03/16/2016 1228   GFRNONAA 3 (L) 03/15/2016 1606   GFRAA 5 (L) 03/17/2016 0659   GFRAA 6 (L) 03/16/2016 1228   GFRAA 4 (L) 03/15/2016 1606   CBC    Component Value Date/Time   WBC 4.1 03/17/2016 0659   RBC 3.50 (L) 03/17/2016 0659   HGB 10.9 (L) 03/17/2016 0659   HCT 33.7 (L) 03/17/2016 0659   PLT 139 (L) 03/17/2016 0659   MCV 96.3 03/17/2016 0659   MCH 31.1 03/17/2016 0659   MCHC 32.3 03/17/2016 0659   RDW 15.5 03/17/2016 0659   LYMPHSABS 1.2 03/14/2016 2214   MONOABS 0.4 03/14/2016 2214   EOSABS 0.2 03/14/2016 2214   BASOSABS 0.0 03/14/2016 2214   HEPATIC Function Panel  Recent Labs  09/07/15 1539 02/12/16 1116 03/14/16 2214  PROT 6.8 7.6 7.3   HEMOGLOBIN A1C No components found for: HGA1C,  MPG CARDIAC ENZYMES Lab Results  Component Value Date   CKTOTAL 113 05/20/2011   CKMB 4.3 (H) 05/20/2011   TROPONINI 0.21 (HH) 03/15/2016   TROPONINI  0.24 (HH) 03/15/2016   TROPONINI 0.22 (HH) 03/15/2016   BNP No results for input(s): PROBNP in the last 8760 hours. TSH  Recent Labs  06/27/15 1619  TSH 0.768   CHOLESTEROL No results for input(s): CHOL in the last 8760 hours.  Scheduled Meds: . allopurinol  150 mg Oral Daily  . amiodarone  100 mg Oral Daily  . amLODipine  5 mg Oral Daily  . carvedilol  12.5 mg Oral Once per day on Mon Wed Fri  . carvedilol  12.5 mg Oral 2 times per day on Sun Tue Thu Sat  . cinacalcet  90 mg Oral Q breakfast  . feeding supplement (NEPRO CARB STEADY)  237 mL Oral BID BM  . isosorbide dinitrate  20 mg Oral Daily  . multivitamin  1 tablet Oral QHS  . pantoprazole  40 mg Oral BID  . pentafluoroprop-tetrafluoroeth      . sevelamer carbonate  2,400 mg Oral TID WC  . sodium chloride  flush  3 mL Intravenous Q12H  . sodium chloride flush  3 mL Intravenous Q12H  . warfarin  10 mg Oral q1800  . Warfarin - Physician Dosing Inpatient   Does not apply q1800   Continuous Infusions: . heparin 1,300 Units/hr (03/17/16 0500)   PRN Meds:.sodium chloride, sodium chloride, sodium chloride, acetaminophen, alteplase, heparin, heparin, lidocaine (PF), lidocaine-prilocaine, nitroGLYCERIN, ondansetron (ZOFRAN) IV, oxyCODONE-acetaminophen, pentafluoroprop-tetrafluoroeth, sodium chloride flush  Assessment/Plan: Chest pain Mild CAD Dilated cardiomyopathy, non-ischemic ESRD S/P recent GB surgery Abdominal pain Obesity Anemia of chronic disease DM, II  Continue medical treatment.   LOS: 2 days    Dixie Dials  MD  03/17/2016, 10:11 AM

## 2016-03-17 NOTE — Care Management Note (Signed)
Case Management Note  Patient Details  Name: [name] MRN: [id number] Date of Birth: 1960/09/10  Subjective/Objective:  S/p cath,  NCM will cont to follow for dc needs.                  Action/Plan:   Expected Discharge Date:                  Expected Discharge Plan:  Home/Self Care  In-House Referral:     Discharge planning Services     Post Acute Care Choice:    Choice offered to:     DME Arranged:    DME Agency:     HH Arranged:    HH Agency:     Status of Service:  In process, will continue to follow  If discussed at Long Length of Stay Meetings, dates discussed:    Additional Comments:  Zenon Mayo, RN 03/17/2016, 2:07 PM

## 2016-03-17 NOTE — Procedures (Signed)
I was present at this session.  I have reviewed the session itself and made appropriate changes.not set as ordered, corrected.  Vol xs . Access press ok.  Kamie Korber L 10/25/20178:33 AM

## 2016-03-18 LAB — PROTIME-INR
INR: 1.06
Prothrombin Time: 13.8 seconds (ref 11.4–15.2)

## 2016-03-18 LAB — CBC WITH DIFFERENTIAL/PLATELET
Basophils Absolute: 0 10*3/uL (ref 0.0–0.1)
Basophils Relative: 0 %
Eosinophils Absolute: 0.2 10*3/uL (ref 0.0–0.7)
Eosinophils Relative: 3 %
HCT: 36.3 % — ABNORMAL LOW (ref 39.0–52.0)
Hemoglobin: 11.9 g/dL — ABNORMAL LOW (ref 13.0–17.0)
Lymphocytes Relative: 23 %
Lymphs Abs: 1.3 10*3/uL (ref 0.7–4.0)
MCH: 31.5 pg (ref 26.0–34.0)
MCHC: 32.8 g/dL (ref 30.0–36.0)
MCV: 96 fL (ref 78.0–100.0)
Monocytes Absolute: 0.6 10*3/uL (ref 0.1–1.0)
Monocytes Relative: 11 %
Neutro Abs: 3.4 10*3/uL (ref 1.7–7.7)
Neutrophils Relative %: 63 %
Platelets: 134 10*3/uL — ABNORMAL LOW (ref 150–400)
RBC: 3.78 MIL/uL — ABNORMAL LOW (ref 4.22–5.81)
RDW: 15.4 % (ref 11.5–15.5)
WBC: 5.5 10*3/uL (ref 4.0–10.5)

## 2016-03-18 LAB — COMPREHENSIVE METABOLIC PANEL
ALT: 8 U/L — ABNORMAL LOW (ref 17–63)
AST: 14 U/L — ABNORMAL LOW (ref 15–41)
Albumin: 3.5 g/dL (ref 3.5–5.0)
Alkaline Phosphatase: 128 U/L — ABNORMAL HIGH (ref 38–126)
Anion gap: 12 (ref 5–15)
BUN: 32 mg/dL — ABNORMAL HIGH (ref 6–20)
CO2: 27 mmol/L (ref 22–32)
Calcium: 8.9 mg/dL (ref 8.9–10.3)
Chloride: 93 mmol/L — ABNORMAL LOW (ref 101–111)
Creatinine, Ser: 9.28 mg/dL — ABNORMAL HIGH (ref 0.61–1.24)
GFR calc Af Amer: 7 mL/min — ABNORMAL LOW (ref >60)
GFR calc non Af Amer: 6 mL/min — ABNORMAL LOW (ref >60)
Glucose, Bld: 87 mg/dL (ref 65–99)
Potassium: 4.9 mmol/L (ref 3.5–5.1)
Sodium: 132 mmol/L — ABNORMAL LOW (ref 135–145)
Total Bilirubin: 0.7 mg/dL (ref 0.3–1.2)
Total Protein: 8 g/dL (ref 6.5–8.1)

## 2016-03-18 LAB — MAGNESIUM: Magnesium: 2.2 mg/dL (ref 1.7–2.4)

## 2016-03-18 LAB — PHOSPHORUS: Phosphorus: 4.3 mg/dL (ref 2.5–4.6)

## 2016-03-18 LAB — HEPARIN LEVEL (UNFRACTIONATED): Heparin Unfractionated: 0.5 IU/mL (ref 0.30–0.70)

## 2016-03-18 MED ORDER — WARFARIN SODIUM 7.5 MG PO TABS
15.0000 mg | ORAL_TABLET | Freq: Once | ORAL | Status: AC
  Administered 2016-03-18: 20:00:00 15 mg via ORAL
  Filled 2016-03-18: qty 2

## 2016-03-18 MED ORDER — ZOLPIDEM TARTRATE 5 MG PO TABS
5.0000 mg | ORAL_TABLET | Freq: Every evening | ORAL | Status: DC | PRN
  Administered 2016-03-18 – 2016-03-20 (×3): 5 mg via ORAL
  Filled 2016-03-18 (×3): qty 1

## 2016-03-18 NOTE — Progress Notes (Signed)
Nutrition Brief Note  Patient identified on the Malnutrition Screening Tool (MST) Report  Pt reports no weight loss PTA. States if any loss it was fluid. Pt does not like all the choices offered here on the Renal diet so some meals have lower intake than others.  I reviewed menu choices with pt including renal appropriate additional options.   Wt Readings from Last 15 Encounters:  03/18/16 245 lb 13 oz (111.5 kg)  02/20/16 240 lb (108.9 kg)  02/13/16 243 lb 13.3 oz (110.6 kg)  01/27/16 240 lb (108.9 kg)  01/21/16 240 lb (108.9 kg)  12/02/15 244 lb (110.7 kg)  09/07/15 244 lb (110.7 kg)  07/05/15 245 lb 13 oz (111.5 kg)  04/23/15 247 lb 12.8 oz (112.4 kg)  01/15/15 249 lb 11.2 oz (113.3 kg)  01/05/15 256 lb (116.1 kg)  12/20/14 262 lb 2 oz (118.9 kg)  09/03/14 269 lb (122 kg)  06/22/13 260 lb (117.9 kg)  06/05/13 257 lb (116.6 kg)    Body mass index is 32.43 kg/m. Patient meets criteria for obesity class I based on current BMI.   Current diet order is Renal, patient is consuming approximately 30-75% of meals at this time. Labs and medications reviewed.  Lab Results  Component Value Date   HGBA1C 5.4 01/13/2015    No nutrition interventions warranted at this time. If nutrition issues arise, please consult RD.   [place], [place], [place] Pager [phone] After Hours Pager

## 2016-03-18 NOTE — Progress Notes (Signed)
ANTICOAGULATION CONSULT NOTE - Follow Up Consult  Pharmacy Consult for Heparin  Indication: chest pain/ACS and atrial fibrillation, s/p cath  Allergies  Allergen Reactions  . Ace Inhibitors Itching and Cough    Patient Measurements: Height: 6\' 1"  (185.4 cm) Weight: 245 lb 13 oz (111.5 kg) IBW/kg (Calculated) : 79.9  Vital Signs: Temp: 97.8 F (36.6 C) (10/26 0739) Temp Source: Oral (10/26 0739) BP: 125/72 (10/26 0739) Pulse Rate: 72 (10/26 0739)  Labs:  Recent Labs  03/15/16 1035  03/15/16 1607  03/16/16 0853 03/16/16 1228 03/17/16 0100 03/17/16 0659 03/17/16 1201 03/18/16 0340  HGB  --   < > 11.0*  --   --   --   --  10.9*  --  11.9*  HCT  --   --  33.4*  --   --   --   --  33.7*  --  36.3*  PLT  --   --  140*  --   --   --   --  139*  --  134*  LABPROT  --   --   --   --  14.3  --   --   --   --  13.8  INR  --   --   --   --  1.11  --   --   --   --  1.06  HEPARINUNFRC  --   < >  --   < > 0.32  --  0.25*  --  0.46 0.50  CREATININE  --   < >  --   --   --  10.49*  --  12.14*  --  9.28*  TROPONINI 0.24*  --  0.21*  --   --   --   --   --   --   --   < > = values in this interval not displayed.  Estimated Creatinine Clearance: 12.2 mL/min (by C-G formula based on SCr of 9.28 mg/dL (H)).   Assessment: Continues on heparin S/P cath 10/24. Coumadin PTA for Afib with subtherapeutic INR on admission of 1.04. Coumadin resumed 10/24 per MD dosing - INR 1.06  HL remains therapeutic at 0.50. CBC low stable - Hgb 11.9, plt 134. ESRD on HD MWF.   Home dose: 9mg  daily   Goal of Therapy:  Heparin level 0.3-0.7 units/ml Monitor platelets by anticoagulation protocol: Yes   Plan:  -Continue heparin at 1300 units/hr -Coumadin per MD -Daily HL -Monitor CBC, s/sx bleeding  Stephens November, PharmD Clinical Pharmacist 8:21 AM, 03/18/2016

## 2016-03-18 NOTE — Progress Notes (Signed)
Subjective: Interval History: has complaints wants to go home.  Objective: Vital signs in last 24 hours: Temp:  [97.2 F (36.2 C)-98.5 F (36.9 C)] 97.8 F (36.6 C) (10/26 0739) Pulse Rate:  [71-86] 72 (10/26 0739) Resp:  [13-76] 22 (10/26 0739) BP: (92-143)/(58-83) 125/72 (10/26 0739) SpO2:  [98 %-100 %] 100 % (10/26 0739) Weight:  [107.8 kg (237 lb 10.5 oz)-111.5 kg (245 lb 13 oz)] 111.5 kg (245 lb 13 oz) (10/26 0421) Weight change: -4.3 kg (-9 lb 7.7 oz)  Intake/Output from previous day: 10/25 0701 - 10/26 0700 In: 812 [I.V.:338; NG/GT:237] Out: 4502 [Stool:2] Intake/Output this shift: Total I/O In: 240 [P.O.:240] Out: -   General appearance: alert, cooperative, no distress and moderately obese Resp: clear to auscultation bilaterally Cardio: S1, S2 normal and systolic murmur: holosystolic 2/6, blowing at apex GI: obese,pos bs, liver down 5 cm Extremities: edema Tr and AVF LLA  Lab Results:  Recent Labs  03/17/16 0659 03/18/16 0340  WBC 4.1 5.5  HGB 10.9* 11.9*  HCT 33.7* 36.3*  PLT 139* 134*   BMET:  Recent Labs  03/17/16 0659 03/18/16 0340  NA 133* 132*  K 5.2* 4.9  CL 96* 93*  CO2 24 27  GLUCOSE 96 87  BUN 43* 32*  CREATININE 12.14* 9.28*  CALCIUM 8.3* 8.9   No results for input(s): PTH in the last 72 hours. Iron Studies: No results for input(s): IRON, TIBC, TRANSFERRIN, FERRITIN in the last 72 hours.  Studies/Results: Dg Abd 1 View  Result Date: 03/17/2016 CLINICAL DATA:  Abdominal pain all over. History of hernia, diverticulitis and nephrectomy and renal insufficiency. EXAM: ABDOMEN - 1 VIEW COMPARISON:  CT, 03/15/2016 FINDINGS: There is no bowel dilation to suggest obstruction or generalized adynamic ileus. There multiple surgical vascular clips in the right upper quadrant from a prior cholecystectomy and right nephrectomy. Soft tissues are otherwise unremarkable. IMPRESSION: 1. No acute findings.  No evidence of bowel obstruction. Electronically  Signed   By: Lajean Manes M.D.   On: 03/17/2016 17:09    I have reviewed the patient's current medications.  Assessment/Plan: 1 ESRD HD MWF.  Have gotten vol down helping bp, lower dry.  2 HTN lower meds 3 abdm pain improving related to op procedure 4 CAD no change 5 anemia esa 6 HPTH vit D 7 obesity P HD Fri, if here. Ok from our standpoint to d/c will lower wgt at [facility]      LOS: 3 days   Vedanth Sirico L 03/18/2016,9:04 AM

## 2016-03-18 NOTE — Progress Notes (Signed)
Ref: Maggie Font, MD   Subjective:  No new complaints. INR low due to salad intake 1-2 days back. Afebrile  Objective:  Vital Signs in the last 24 hours: Temp:  [97.2 F (36.2 C)-98.5 F (36.9 C)] 97.8 F (36.6 C) (10/26 0739) Pulse Rate:  [71-86] 72 (10/26 0739) Cardiac Rhythm: Normal sinus rhythm (10/26 0730) Resp:  [13-76] 22 (10/26 0739) BP: (92-135)/(58-83) 125/72 (10/26 0739) SpO2:  [98 %-100 %] 100 % (10/26 0739) Weight:  [107.8 kg (237 lb 10.5 oz)-111.5 kg (245 lb 13 oz)] 111.5 kg (245 lb 13 oz) (10/26 0421)  Physical Exam: BP Readings from Last 1 Encounters:  03/18/16 125/72    Wt Readings from Last 1 Encounters:  03/18/16 111.5 kg (245 lb 13 oz)    Weight change: -4.3 kg (-9 lb 7.7 oz) Body mass index is 32.43 kg/m. HEENT: [place]/AT, Eyes-Brown, PERL, EOMI, Conjunctiva-Pink, Sclera-Non-icteric Neck: No JVD, No bruit, Trachea midline. Lungs:  Clear, Bilateral. Cardiac:  Regular rhythm, normal S1 and S2, no S3. II/VI systolic murmur. Abdomen:  Soft, non-tender. Extremities:  No edema present. No cyanosis. No clubbing. No right groin hematoma. CNS: AxOx3, Cranial nerves grossly intact, moves all 4 extremities. Right handed. Skin: Warm and dry.   Intake/Output from previous day: 10/25 0701 - 10/26 0700 In: 812 [I.V.:338; NG/GT:237] Out: 4502 [Stool:2]    Lab Results: BMET    Component Value Date/Time   NA 132 (L) 03/18/2016 0340   NA 133 (L) 03/17/2016 0659   NA 134 (L) 03/16/2016 1228   K 4.9 03/18/2016 0340   K 5.2 (H) 03/17/2016 0659   K 4.9 03/16/2016 1228   CL 93 (L) 03/18/2016 0340   CL 96 (L) 03/17/2016 0659   CL 98 (L) 03/16/2016 1228   CO2 27 03/18/2016 0340   CO2 24 03/17/2016 0659   CO2 28 03/16/2016 1228   GLUCOSE 87 03/18/2016 0340   GLUCOSE 96 03/17/2016 0659   GLUCOSE 90 03/16/2016 1228   BUN 32 (H) 03/18/2016 0340   BUN 43 (H) 03/17/2016 0659   BUN 32 (H) 03/16/2016 1228   CREATININE 9.28 (H) 03/18/2016 0340   CREATININE 12.14 (H)  03/17/2016 0659   CREATININE 10.49 (H) 03/16/2016 1228   CALCIUM 8.9 03/18/2016 0340   CALCIUM 8.3 (L) 03/17/2016 0659   CALCIUM 8.4 (L) 03/16/2016 1228   GFRNONAA 6 (L) 03/18/2016 0340   GFRNONAA 4 (L) 03/17/2016 0659   GFRNONAA 5 (L) 03/16/2016 1228   GFRAA 7 (L) 03/18/2016 0340   GFRAA 5 (L) 03/17/2016 0659   GFRAA 6 (L) 03/16/2016 1228   CBC    Component Value Date/Time   WBC 5.5 03/18/2016 0340   RBC 3.78 (L) 03/18/2016 0340   HGB 11.9 (L) 03/18/2016 0340   HCT 36.3 (L) 03/18/2016 0340   PLT 134 (L) 03/18/2016 0340   MCV 96.0 03/18/2016 0340   MCH 31.5 03/18/2016 0340   MCHC 32.8 03/18/2016 0340   RDW 15.4 03/18/2016 0340   LYMPHSABS 1.3 03/18/2016 0340   MONOABS 0.6 03/18/2016 0340   EOSABS 0.2 03/18/2016 0340   BASOSABS 0.0 03/18/2016 0340   HEPATIC Function Panel  Recent Labs  02/12/16 1116 03/14/16 2214 03/18/16 0340  PROT 7.6 7.3 8.0   HEMOGLOBIN A1C No components found for: HGA1C,  MPG CARDIAC ENZYMES Lab Results  Component Value Date   CKTOTAL 113 05/20/2011   CKMB 4.3 (H) 05/20/2011   TROPONINI 0.21 (HH) 03/15/2016   TROPONINI 0.24 (HH) 03/15/2016   TROPONINI  0.22 (HH) 03/15/2016   BNP No results for input(s): PROBNP in the last 8760 hours. TSH  Recent Labs  06/27/15 1619  TSH 0.768   CHOLESTEROL No results for input(s): CHOL in the last 8760 hours.  Scheduled Meds: . allopurinol  150 mg Oral Daily  . amiodarone  100 mg Oral Daily  . carvedilol  12.5 mg Oral Once per day on Mon Wed Fri  . carvedilol  12.5 mg Oral 2 times per day on Sun Tue Thu Sat  . cinacalcet  90 mg Oral Q breakfast  . feeding supplement (NEPRO CARB STEADY)  237 mL Oral BID BM  . isosorbide dinitrate  20 mg Oral Daily  . multivitamin  1 tablet Oral QHS  . pantoprazole  40 mg Oral BID  . sevelamer carbonate  2,400 mg Oral TID WC  . sodium chloride flush  3 mL Intravenous Q12H  . sodium chloride flush  3 mL Intravenous Q12H  . warfarin  15 mg Oral ONCE-1800  .  Warfarin - Physician Dosing Inpatient   Does not apply q1800   Continuous Infusions: . heparin 1,300 Units/hr (03/18/16 0700)   PRN Meds:.sodium chloride, acetaminophen, nitroGLYCERIN, ondansetron (ZOFRAN) IV, oxyCODONE-acetaminophen, sodium chloride flush  Assessment/Plan: Chest pain, resolved CAD Dilated cardiomyopathy S/P GB surgery Abdominal pain, resolved Obesity Anemia of chronic disease DM, II Paroxysmal Atrial flutter, CHA2DSVASC score 5  Offered Lovenox bridging. Patient prefers IV heparin for one more day. Will increase coumadin to 15 mg. Dose. Patient to avoid salad intake. Increase activity.   LOS: 3 days    Dixie Dials  MD  03/18/2016, 10:22 AM

## 2016-03-18 NOTE — Progress Notes (Signed)
PROGRESS NOTE    [name]  ONG:[id number] DOB: 01/06/1961 DOA: 03/14/2016 PCP: Maggie Font, MD  Brief Narrative:  [name] is an 55 y.o. male with a PMH of ESRD secondary to HTN with solitary kidney on HD MWF, biliary dyskinesia s/p laparoscopic lysis of adhesions and cholecystectomy with cholangiogram 02/12/16, atrial flutter on chronic coumadin (although INR low on admission, raising question of compliance), DM II, Obesity, HCC s/p right nephrectomy, chronic systolic CHF with an EF of 20-25% (per Echo 06/2015) and dilated non-ischemic cardiomyopathy who was admitted 03/14/16 with chief complaint of right upper quadrant abdominal pain and lower central chest pain. In the ED, his troponin was elevated at 0.2 to and a CT scan of his abdomen showed fluid in the subhepatic area with inflammation concerning for infection versus bile leak. General Surgery was consulted and evaluated and felt like his pain was not 2/2 to a bile leak. Cardiology was consulted because of CP and patient underwent Cardiac Catheterization. He is currently still in the hospital because his INR is subtherapeutic and he has refused Lovenox Bridging at current.   Assessment & Plan:   Principal Problem:   Right upper quadrant abdominal pain Active Problems:   Hypertension, benign   DM II (diabetes mellitus, type II), controlled (HCC)   ESRD (end stage renal disease) on dialysis (HCC)   Bile leak, postoperative   Elevated troponin   Rib pain on right side  Atrial Flutter -Continue with Amiodarone 100 mg po Daily and with Carvediolol 12.5 mg po BID -Anticoagulated with Heparin gtt and with Warfarin Dosing per Dr. Doylene Canard -**INR is currently Subtherapeutic at 1.06 and was subtherapeutic on admission -Discussed with Patient about Lovenox Bridging and he states he wants to continue with Heparin gtt and does not want to inject himself with Lovenox shots -Dr. Gurney Maxin to prescribe 15 mg of Warfarin tonight -Avoid Leafy  Green Vegetables -Repeat PT/INR in AM  RUQ Pain/Lower Chest Pain r/o'd postoperative bile leak and SBO, improved -General Surgery Consulted and signed off as HIDA scan negative for bile leak.  -Lipase also elevated 10/22 but trended down to 35. Pain may be pleuritic given cough and pleuritic quality. -Abdomen and Pelvis Flat Plate showed No acute findings. No evidence of bowel obstruction. -Pain improving. Has meds for pain. Continue to Moniotor  Dilated Cardiomyopathy with prior MI -EF on Cardiac Cath was 25-35% by Visual Estimate (20-25% on Echo 06/2015) -Cw Isordil 20 mg po Daily, Carvedilol 12.5 mg po BID, Amlodipine 10 mg po Daily -No ACE/ARB because patient is allergic -Appreciate Additional Cardiac Recc's -Continue with Dialysis to try to obtain EDW; Will Dialyze tomorrow  Hypertension -Continue to Monitor Vital Signs -Continue Norvasc, Coreg and Isordil.  DM Mellitus Type 2 Diet controlled.  Mild Hyperkalemia, improved -Potassium was 4.9 -Repeat BMP in AM  Mild Hyponatremia -Patient's Na+ was 132 this AM -Repeat in AM  Anemia of Chronic Disease 2/2 to ESRD -Hb/Hct went from 11.0/33.4 -> 10.9/33.7 -> 11.9/36.3 -Continue to Monitor CBC  ESRD on HD MWF -BUN/Cr was 32/9.28 this AM -Renal Diet with 1200 mL Fluid Restriction -Dialyzed yesterday and Nephro following -Continue Sensipar 90 mg po daily and Renvela 2,400 mg po TID.  Elevated Troponin r/o'd ACS likely 2/2 to ESRD -Troponin mildly elevated but flat at 0.21 -> 0.22 -> 0.24 -> 0.21 -C/w Isordil 20 mg po Daily and with NG SL 0.4 mg q72minprn -Spoke with the patient's cardiologist, Dr. Doylene Canard who took him for cardiac catheterization today yesterday.   -  Cardiac Catheterization showed  Prox LAD lesion, 25 % stenosed.  There is moderate left ventricular systolic dysfunction.  LV end diastolic pressure is normal.  The left ventricular ejection fraction is 25-35% by visual estimate.  There is trivial (1+)  mitral regurgitation.  -Continue IV heparin for now. -Continue Coumadin (dosed per Dr. Doylene Canard) with Heparin gtt as patient's INR is subtherapeutic  Gout -Continue Allopurinol 150 mg po Daily  DVT prophylaxis: Coumadin with Heparin gtt for bridging Code Status: Full Family Communication: No Family at Bedside Disposition Plan: Home at D/C.  Consultants:  Cardiology General Surgery Nephrology  Procedures: Hemodialysis. Cardiac Catheterization   Antimicrobials: None  Subjective: Patient was seen and examined this am and he was doing well. Stated that his abdominal pain was much improved. Had no CP/SOB/N/V or Lightheadedness. No other complaints or concerns at this time.   Objective: Vitals:   03/18/16 0421 03/18/16 0739 03/18/16 1116 03/18/16 1548  BP: (!) 102/59 125/72 (!) 106/56 126/84  Pulse: 71 72 66 66  Resp: 17 (!) 22 15 17   Temp: 98.1 F (36.7 C) 97.8 F (36.6 C) 97.8 F (36.6 C) 97.6 F (36.4 C)  TempSrc: Oral Oral Oral Oral  SpO2: 98% 100% 97% 98%  Weight: 111.5 kg (245 lb 13 oz)     Height:        Intake/Output Summary (Last 24 hours) at 03/18/16 1658 Last data filed at 03/18/16 1300  Gross per 24 hour  Intake              818 ml  Output                0 ml  Net              818 ml   Filed Weights   03/17/16 0639 03/17/16 1146 03/18/16 0421  Weight: 112.6 kg (248 lb 3.8 oz) 107.8 kg (237 lb 10.5 oz) 111.5 kg (245 lb 13 oz)    Examination: Physical Exam:  Constitutional: WN/WD, NAD and appears calm and comfortable Eyes: Lids and conjunctivae normal, sclerae anicteric  ENMT: External Ears, Nose appear normal. Grossly normal hearing. Mucous membranes are moist.  Neck: Appears normal, supple, no cervical masses, normal ROM, no appreciable thyromegaly Respiratory: Clear to auscultation bilaterally, no wheezing, rales, rhonchi or crackles. Normal respiratory effort and patient is not tachypenic. No accessory muscle use.  Cardiovascular: RRR, no murmurs /  rubs / gallops. S1 and S2 auscultated. No extremity edema. 2+ pedal pulses. No carotid bruits.  Abdomen: Soft, nontender to palpation, distended 2/2 body habitus. No masses palpated. No appreciable hepatosplenomegaly. Bowel sounds positive.  GU: Deferred. Musculoskeletal: No clubbing / cyanosis of digits/nails. No joint deformity upper and lower extremities. Good ROM, no contractures.  Skin: No rashes, lesions, ulcers. No induration; Warm and dry. Abdominal scars noted from cholecystectomy. Left Arm AVF had good thrill and auscultated bruit.  Neurologic: CN 2-12 grossly intact with no focal deficits. Sensation intact in all 4 Extremities. Romberg sign cerebellar reflexes not assessed.  Psychiatric: Normal judgment and insight. Alert and oriented x 3. Normal mood and appropriate affect.   Data Reviewed: I have personally reviewed following labs and imaging studies  CBC:  Recent Labs Lab 03/14/16 2214 03/15/16 1607 03/17/16 0659 03/18/16 0340  WBC 4.7 3.9* 4.1 5.5  NEUTROABS 2.9  --   --  3.4  HGB 11.3* 11.0* 10.9* 11.9*  HCT 34.6* 33.4* 33.7* 36.3*  MCV 96.4 95.4 96.3 96.0  PLT 144* 140* 139* 134*  Basic Metabolic Panel:  Recent Labs Lab 03/14/16 2214 03/15/16 1606 03/16/16 1228 03/17/16 0659 03/18/16 0340  NA 136 133* 134* 133* 132*  K 4.6 6.1* 4.9 5.2* 4.9  CL 100* 99* 98* 96* 93*  CO2 25 24 28 24 27   GLUCOSE 98 102* 90 96 87  BUN 50* 60* 32* 43* 32*  CREATININE 13.40* 15.23* 10.49* 12.14* 9.28*  CALCIUM 8.1* 7.8* 8.4* 8.3* 8.9  MG  --   --   --   --  2.2  PHOS  --  4.1 4.9* 5.7* 4.3   GFR: Estimated Creatinine Clearance: 12.2 mL/min (by C-G formula based on SCr of 9.28 mg/dL (H)). Liver Function Tests:  Recent Labs Lab 03/14/16 2214 03/15/16 1606 03/16/16 1228 03/17/16 0659 03/18/16 0340  AST 13*  --   --   --  14*  ALT 12*  --   --   --  8*  ALKPHOS 101  --   --   --  128*  BILITOT 0.9  --   --   --  0.7  PROT 7.3  --   --   --  8.0  ALBUMIN 3.4* 3.1*  3.3* 3.2* 3.5    Recent Labs Lab 03/14/16 2214 03/16/16 1228  LIPASE 125* 35   No results for input(s): AMMONIA in the last 168 hours. Coagulation Profile:  Recent Labs Lab 03/14/16 2214 03/16/16 0853 03/18/16 0340  INR 1.04 1.11 1.06   Cardiac Enzymes:  Recent Labs Lab 03/14/16 2214 03/15/16 0119 03/15/16 0437 03/15/16 1035 03/15/16 1607  TROPONINI 0.22* 0.21* 0.22* 0.24* 0.21*   BNP (last 3 results) No results for input(s): PROBNP in the last 8760 hours. HbA1C: No results for input(s): HGBA1C in the last 72 hours. CBG: No results for input(s): GLUCAP in the last 168 hours. Lipid Profile: No results for input(s): CHOL, HDL, LDLCALC, TRIG, CHOLHDL, LDLDIRECT in the last 72 hours. Thyroid Function Tests: No results for input(s): TSH, T4TOTAL, FREET4, T3FREE, THYROIDAB in the last 72 hours. Anemia Panel: No results for input(s): VITAMINB12, FOLATE, FERRITIN, TIBC, IRON, RETICCTPCT in the last 72 hours. Sepsis Labs: No results for input(s): PROCALCITON, LATICACIDVEN in the last 168 hours.  Recent Results (from the past 240 hour(s))  MRSA PCR Screening     Status: None   Collection Time: 03/16/16  2:50 AM  Result Value Ref Range Status   MRSA by PCR NEGATIVE NEGATIVE Final    Comment:        The GeneXpert MRSA Assay (FDA approved for NASAL specimens only), is one component of a comprehensive MRSA colonization surveillance program. It is not intended to diagnose MRSA infection nor to guide or monitor treatment for MRSA infections.     Radiology Studies: Dg Abd 1 View  Result Date: 03/17/2016 CLINICAL DATA:  Abdominal pain all over. History of hernia, diverticulitis and nephrectomy and renal insufficiency. EXAM: ABDOMEN - 1 VIEW COMPARISON:  CT, 03/15/2016 FINDINGS: There is no bowel dilation to suggest obstruction or generalized adynamic ileus. There multiple surgical vascular clips in the right upper quadrant from a prior cholecystectomy and right  nephrectomy. Soft tissues are otherwise unremarkable. IMPRESSION: 1. No acute findings.  No evidence of bowel obstruction. Electronically Signed   By: Lajean Manes M.D.   On: 03/17/2016 17:09   Scheduled Meds: . allopurinol  150 mg Oral Daily  . amiodarone  100 mg Oral Daily  . carvedilol  12.5 mg Oral Once per day on Mon Wed Fri  . carvedilol  12.5 mg Oral 2 times per day on Sun Tue Thu Sat  . cinacalcet  90 mg Oral Q breakfast  . feeding supplement (NEPRO CARB STEADY)  237 mL Oral BID BM  . isosorbide dinitrate  20 mg Oral Daily  . multivitamin  1 tablet Oral QHS  . pantoprazole  40 mg Oral BID  . sevelamer carbonate  2,400 mg Oral TID WC  . sodium chloride flush  3 mL Intravenous Q12H  . sodium chloride flush  3 mL Intravenous Q12H  . warfarin  15 mg Oral ONCE-1800  . Warfarin - Physician Dosing Inpatient   Does not apply q1800   Continuous Infusions: . heparin 1,300 Units/hr (03/18/16 0700)     LOS: 3 days   Kerney Elbe, DO [facility] Pager [phone]  If 7PM-7AM, please contact night-coverage [email or web address] Password [email or web address] 03/18/2016, 4:58 PM

## 2016-03-19 ENCOUNTER — Encounter (HOSPITAL_COMMUNITY): Payer: Self-pay | Admitting: General Practice

## 2016-03-19 DIAGNOSIS — Z7901 Long term (current) use of anticoagulants: Secondary | ICD-10-CM

## 2016-03-19 DIAGNOSIS — Z5181 Encounter for therapeutic drug level monitoring: Secondary | ICD-10-CM

## 2016-03-19 LAB — COMPREHENSIVE METABOLIC PANEL
ALT: 24 U/L (ref 17–63)
AST: 21 U/L (ref 15–41)
Albumin: 3.4 g/dL — ABNORMAL LOW (ref 3.5–5.0)
Alkaline Phosphatase: 142 U/L — ABNORMAL HIGH (ref 38–126)
Anion gap: 12 (ref 5–15)
BUN: 46 mg/dL — ABNORMAL HIGH (ref 6–20)
CO2: 27 mmol/L (ref 22–32)
Calcium: 8.5 mg/dL — ABNORMAL LOW (ref 8.9–10.3)
Chloride: 93 mmol/L — ABNORMAL LOW (ref 101–111)
Creatinine, Ser: 11.96 mg/dL — ABNORMAL HIGH (ref 0.61–1.24)
GFR calc Af Amer: 5 mL/min — ABNORMAL LOW (ref >60)
GFR calc non Af Amer: 4 mL/min — ABNORMAL LOW (ref >60)
Glucose, Bld: 92 mg/dL (ref 65–99)
Potassium: 5.2 mmol/L — ABNORMAL HIGH (ref 3.5–5.1)
Sodium: 132 mmol/L — ABNORMAL LOW (ref 135–145)
Total Bilirubin: 0.6 mg/dL (ref 0.3–1.2)
Total Protein: 7.5 g/dL (ref 6.5–8.1)

## 2016-03-19 LAB — CBC
HCT: 35 % — ABNORMAL LOW (ref 39.0–52.0)
Hemoglobin: 11.4 g/dL — ABNORMAL LOW (ref 13.0–17.0)
MCH: 31.2 pg (ref 26.0–34.0)
MCHC: 32.6 g/dL (ref 30.0–36.0)
MCV: 95.9 fL (ref 78.0–100.0)
Platelets: 142 10*3/uL — ABNORMAL LOW (ref 150–400)
RBC: 3.65 MIL/uL — ABNORMAL LOW (ref 4.22–5.81)
RDW: 15.5 % (ref 11.5–15.5)
WBC: 4.9 10*3/uL (ref 4.0–10.5)

## 2016-03-19 LAB — PROTIME-INR
INR: 1.15
Prothrombin Time: 14.8 seconds (ref 11.4–15.2)

## 2016-03-19 LAB — HEPARIN LEVEL (UNFRACTIONATED): Heparin Unfractionated: 0.43 IU/mL (ref 0.30–0.70)

## 2016-03-19 MED ORDER — DOXERCALCIFEROL 4 MCG/2ML IV SOLN
INTRAVENOUS | Status: AC
  Filled 2016-03-19: qty 2

## 2016-03-19 MED ORDER — WARFARIN SODIUM 7.5 MG PO TABS
15.0000 mg | ORAL_TABLET | Freq: Once | ORAL | Status: AC
  Administered 2016-03-19: 16:00:00 15 mg via ORAL
  Filled 2016-03-19: qty 2

## 2016-03-19 MED ORDER — DOXERCALCIFEROL 4 MCG/2ML IV SOLN
1.0000 ug | INTRAVENOUS | Status: DC
  Administered 2016-03-19: 1 ug via INTRAVENOUS

## 2016-03-19 NOTE — Care Management Important Message (Signed)
Important Message  Patient Details  Name: [name] MRN: [id number] Date of Birth: [birth year]   Medicare Important Message Given:  Yes    Orbie Pyo 03/19/2016, 3:03 PM

## 2016-03-19 NOTE — Progress Notes (Signed)
Ref: Maggie Font, MD   Subjective:  Feeling better. Afebrile.  Objective:  Vital Signs in the last 24 hours: Temp:  [97.2 F (36.2 C)-97.8 F (36.6 C)] 97.5 F (36.4 C) (10/27 0827) Pulse Rate:  [66-69] 69 (10/27 0827) Cardiac Rhythm: Normal sinus rhythm (10/27 0730) Resp:  [15-18] 15 (10/27 0827) BP: (106-134)/(56-87) 134/79 (10/27 0827) SpO2:  [97 %-99 %] 99 % (10/27 0827) Weight:  [113.1 kg (249 lb 5.4 oz)] 113.1 kg (249 lb 5.4 oz) (10/27 0340)  Physical Exam: BP Readings from Last 1 Encounters:  03/19/16 134/79    Wt Readings from Last 1 Encounters:  03/19/16 113.1 kg (249 lb 5.4 oz)    Weight change: 5.3 kg (11 lb 11 oz) Body mass index is 32.9 kg/m. HEENT: [place]/AT, Eyes-Brown, PERL, EOMI, Conjunctiva-Pink, Sclera-Non-icteric Neck: No JVD, No bruit, Trachea midline. Lungs:  Clear, Bilateral. Cardiac:  Regular rhythm, normal S1 and S2, no S3. II/VI systolic murmur.  Abdomen:  Soft, non-tender. Extremities:  No edema present. No cyanosis. No clubbing. On HD via LLA AVF. CNS: AxOx3, Cranial nerves grossly intact, moves all 4 extremities. Right handed. Skin: Warm and dry.   Intake/Output from previous day: 10/26 0701 - 10/27 0700 In: 1259 [P.O.:960; I.V.:299] Out: 0     Lab Results: BMET    Component Value Date/Time   NA 132 (L) 03/19/2016 0443   NA 132 (L) 03/18/2016 0340   NA 133 (L) 03/17/2016 0659   K 5.2 (H) 03/19/2016 0443   K 4.9 03/18/2016 0340   K 5.2 (H) 03/17/2016 0659   CL 93 (L) 03/19/2016 0443   CL 93 (L) 03/18/2016 0340   CL 96 (L) 03/17/2016 0659   CO2 27 03/19/2016 0443   CO2 27 03/18/2016 0340   CO2 24 03/17/2016 0659   GLUCOSE 92 03/19/2016 0443   GLUCOSE 87 03/18/2016 0340   GLUCOSE 96 03/17/2016 0659   BUN 46 (H) 03/19/2016 0443   BUN 32 (H) 03/18/2016 0340   BUN 43 (H) 03/17/2016 0659   CREATININE 11.96 (H) 03/19/2016 0443   CREATININE 9.28 (H) 03/18/2016 0340   CREATININE 12.14 (H) 03/17/2016 0659   CALCIUM 8.5 (L) 03/19/2016  0443   CALCIUM 8.9 03/18/2016 0340   CALCIUM 8.3 (L) 03/17/2016 0659   GFRNONAA 4 (L) 03/19/2016 0443   GFRNONAA 6 (L) 03/18/2016 0340   GFRNONAA 4 (L) 03/17/2016 0659   GFRAA 5 (L) 03/19/2016 0443   GFRAA 7 (L) 03/18/2016 0340   GFRAA 5 (L) 03/17/2016 0659   CBC    Component Value Date/Time   WBC 4.9 03/19/2016 0443   RBC 3.65 (L) 03/19/2016 0443   HGB 11.4 (L) 03/19/2016 0443   HCT 35.0 (L) 03/19/2016 0443   PLT 142 (L) 03/19/2016 0443   MCV 95.9 03/19/2016 0443   MCH 31.2 03/19/2016 0443   MCHC 32.6 03/19/2016 0443   RDW 15.5 03/19/2016 0443   LYMPHSABS 1.3 03/18/2016 0340   MONOABS 0.6 03/18/2016 0340   EOSABS 0.2 03/18/2016 0340   BASOSABS 0.0 03/18/2016 0340   HEPATIC Function Panel  Recent Labs  03/14/16 2214 03/18/16 0340 03/19/16 0443  PROT 7.3 8.0 7.5   HEMOGLOBIN A1C No components found for: HGA1C,  MPG CARDIAC ENZYMES Lab Results  Component Value Date   CKTOTAL 113 05/20/2011   CKMB 4.3 (H) 05/20/2011   TROPONINI 0.21 (HH) 03/15/2016   TROPONINI 0.24 (HH) 03/15/2016   TROPONINI 0.22 (HH) 03/15/2016   BNP No results for input(s): PROBNP in  the last 8760 hours. TSH  Recent Labs  06/27/15 1619  TSH 0.768   CHOLESTEROL No results for input(s): CHOL in the last 8760 hours.  Scheduled Meds: . allopurinol  150 mg Oral Daily  . amiodarone  100 mg Oral Daily  . carvedilol  12.5 mg Oral Once per day on Mon Wed Fri  . carvedilol  12.5 mg Oral 2 times per day on Sun Tue Thu Sat  . cinacalcet  90 mg Oral Q breakfast  . doxercalciferol  1 mcg Intravenous Q M,W,F-HD  . feeding supplement (NEPRO CARB STEADY)  237 mL Oral BID BM  . isosorbide dinitrate  20 mg Oral Daily  . multivitamin  1 tablet Oral QHS  . pantoprazole  40 mg Oral BID  . sevelamer carbonate  2,400 mg Oral TID WC  . sodium chloride flush  3 mL Intravenous Q12H  . sodium chloride flush  3 mL Intravenous Q12H  . warfarin  15 mg Oral ONCE-1800  . Warfarin - Physician Dosing Inpatient    Does not apply q1800   Continuous Infusions: . heparin 1,300 Units/hr (03/19/16 0600)   PRN Meds:.sodium chloride, acetaminophen, nitroGLYCERIN, ondansetron (ZOFRAN) IV, oxyCODONE-acetaminophen, sodium chloride flush, zolpidem  Assessment/Plan: CAD Dilated cardiomyopathy S/P GB surgery Obesity Anemia od chronic disease ESRD DM, II Paroxysmal Atrial flutter, CHA2DSVASc score of 5  Continue 15 mg. Warfarin till INR is over 1.8 to 2, then back to 9 mg. daily.   LOS: 4 days    Dixie Dials  MD  03/19/2016, 10:06 AM

## 2016-03-19 NOTE — Progress Notes (Signed)
Patient given renvala with breakfast, all other am scheduled meds held before dialysis as patient states he usually does.

## 2016-03-19 NOTE — Progress Notes (Signed)
[facility] Progress Note  Dialysis Orders: [facility] MWF 4h 180 F 2k/2.25Ca 450/800 EDW 109kg Hectorol 70mcg IV q week  Heparin 7000 U IV q treatment OP Labs: Hgb 11.6 Ca 9.7 P 4.6 iPTH 207  Assessment/Plan: 1. Chest pain with elevated trop - dilated CM EF - decrease volume with HD 2. ESRD - MWF-HD today Lower dry at [facility] 3. Anemia -  hgb 11.4 - no ESA  4. Secondary hyperparathyroidism - Ca/P ok - current renvela/sensipar/resume hectorol 5. HTN/volume - BP good - lowering EDW - net UF 4.5 Wed with post weight 107.8- goal today - listed as 2.5 - need standing weights and eval when he comes to HD; coreg 12.5 bid off Amlod 6. Nutrition - alb 3.4- suspect unintentional weight loss with GI issues 7. Abdominal pain s/p cholecystectomy 9/21; concern for intermittant SBO due to lysis of adhesions 8. Afib - slow response to coumadin /per pharm/Dr. Lysle Rubens, PA-C [facility] Beeper [phone] 03/19/2016,8:59 AM  LOS: 4 days  I have seen and examined this patient and agree with the plan of care seen , eval, examined, couseled patient, arranging HD outpatient. .  Thoams Siefert L 03/19/2016, 9:46 AM   Subjective:   Asking why he hasn't gone to dialysis yet; concerned that post HD weight may be too low  Objective Vitals:   03/18/16 1947 03/18/16 2000 03/19/16 0340 03/19/16 0827  BP: 131/87  121/75 134/79  Pulse: 67  69 69  Resp: 15 16 16 15   Temp: 97.5 F (36.4 C)  97.2 F (36.2 C) 97.5 F (36.4 C)  TempSrc: Axillary  Axillary Oral  SpO2: 97%  97% 99%  Weight:   113.1 kg (249 lb 5.4 oz)   Height:       Physical Exam General: NAD Heart: RRR Gr 2/6 holosys M Lungs: no rales Abdomen: obese soft NTND liver down 5 cm Extremities: no LE edema Dialysis Access:  Left AVF + bruit some aneurysms   Additional Objective Labs: Basic Metabolic Panel:  Recent Labs Lab 03/16/16 1228 03/17/16 0659 03/18/16 0340 03/19/16 0443  NA 134* 133*  132* 132*  K 4.9 5.2* 4.9 5.2*  CL 98* 96* 93* 93*  CO2 28 24 27 27   GLUCOSE 90 96 87 92  BUN 32* 43* 32* 46*  CREATININE 10.49* 12.14* 9.28* 11.96*  CALCIUM 8.4* 8.3* 8.9 8.5*  PHOS 4.9* 5.7* 4.3  --    Liver Function Tests:  Recent Labs Lab 03/14/16 2214  03/17/16 0659 03/18/16 0340 03/19/16 0443  AST 13*  --   --  14* 21  ALT 12*  --   --  8* 24  ALKPHOS 101  --   --  128* 142*  BILITOT 0.9  --   --  0.7 0.6  PROT 7.3  --   --  8.0 7.5  ALBUMIN 3.4*  < > 3.2* 3.5 3.4*  < > = values in this interval not displayed.  Recent Labs Lab 03/14/16 2214 03/16/16 1228  LIPASE 125* 35   CBC:  Recent Labs Lab 03/14/16 2214 03/15/16 1607 03/17/16 0659 03/18/16 0340 03/19/16 0443  WBC 4.7 3.9* 4.1 5.5 4.9  NEUTROABS 2.9  --   --  3.4  --   HGB 11.3* 11.0* 10.9* 11.9* 11.4*  HCT 34.6* 33.4* 33.7* 36.3* 35.0*  MCV 96.4 95.4 96.3 96.0 95.9  PLT 144* 140* 139* 134* 142*   Cardiac Enzymes:  Recent Labs Lab 03/14/16 2214 03/15/16 0119 03/15/16  2671 03/15/16 1035 03/15/16 1607  TROPONINI 0.22* 0.21* 0.22* 0.24* 0.21*   CBG: No results for input(s): GLUCAP in the last 168 hours. Iron Studies: No results for input(s): IRON, TIBC, TRANSFERRIN, FERRITIN in the last 72 hours. Lab Results  Component Value Date   INR 1.15 03/19/2016   INR 1.06 03/18/2016   INR 1.11 03/16/2016   Studies/Results: Dg Abd 1 View  Result Date: 03/17/2016 CLINICAL DATA:  Abdominal pain all over. History of hernia, diverticulitis and nephrectomy and renal insufficiency. EXAM: ABDOMEN - 1 VIEW COMPARISON:  CT, 03/15/2016 FINDINGS: There is no bowel dilation to suggest obstruction or generalized adynamic ileus. There multiple surgical vascular clips in the right upper quadrant from a prior cholecystectomy and right nephrectomy. Soft tissues are otherwise unremarkable. IMPRESSION: 1. No acute findings.  No evidence of bowel obstruction. Electronically Signed   By: Lajean Manes M.D.   On:  03/17/2016 17:09   Medications: . heparin 1,300 Units/hr (03/19/16 0600)   . allopurinol  150 mg Oral Daily  . amiodarone  100 mg Oral Daily  . carvedilol  12.5 mg Oral Once per day on Mon Wed Fri  . carvedilol  12.5 mg Oral 2 times per day on Sun Tue Thu Sat  . cinacalcet  90 mg Oral Q breakfast  . feeding supplement (NEPRO CARB STEADY)  237 mL Oral BID BM  . isosorbide dinitrate  20 mg Oral Daily  . multivitamin  1 tablet Oral QHS  . pantoprazole  40 mg Oral BID  . sevelamer carbonate  2,400 mg Oral TID WC  . sodium chloride flush  3 mL Intravenous Q12H  . sodium chloride flush  3 mL Intravenous Q12H  . warfarin  15 mg Oral ONCE-1800  . Warfarin - Physician Dosing Inpatient   Does not apply [phone]

## 2016-03-19 NOTE — Procedures (Signed)
I was present at this session.  I have reviewed the session itself and made appropriate changes.  HD via LLA AVF avoid Aneurysms. Lower vol ,estab new dry  Kayson Bullis L 10/27/20179:47 AM

## 2016-03-19 NOTE — Progress Notes (Signed)
PROGRESS NOTE    [name]  KXF:[id number] DOB: [birth year] DOA: 03/14/2016 PCP: Maggie Font, MD  Brief Narrative:  [name] is an 55 y.o. male with a PMH of ESRD secondary to HTN with solitary kidney on HD MWF, biliary dyskinesia s/p laparoscopic lysis of adhesions and cholecystectomy with cholangiogram 02/12/16, atrial flutter on chronic coumadin (although INR low on admission, raising question of compliance), DM II, Obesity, HCC s/p right nephrectomy, chronic systolic CHF with an EF of 20-25% (per Echo 06/2015) and dilated non-ischemic cardiomyopathy who was admitted 03/14/16 with chief complaint of right upper quadrant abdominal pain and lower central chest pain. In the ED, his troponin was elevated at 0.2 to and a CT scan of his abdomen showed fluid in the subhepatic area with inflammation concerning for infection versus bile leak. General Surgery was consulted and evaluated and felt like his pain was not 2/2 to a bile leak. Cardiology was consulted because of CP and patient underwent Cardiac Catheterization. He is currently still in the hospital because his INR is subtherapeutic and he has refused Lovenox Bridging at current time.   Assessment & Plan:   Principal Problem:   Right upper quadrant abdominal pain Active Problems:   Hypertension, benign   DM II (diabetes mellitus, type II), controlled (HCC)   ESRD (end stage renal disease) on dialysis (HCC)   Bile leak, postoperative   Elevated troponin   Rib pain on right side  Atrial Flutter -Continue with Amiodarone 100 mg po Daily and with Carvediolol 12.5 mg po BID -Anticoagulated with Heparin gtt and with Warfarin Dosing per Dr. Doylene Canard -**INR is currently Subtherapeutic at 1.15 and was subtherapeutic on admission -Discussed with Patient about Lovenox Bridging and he states he wants to continue with Heparin gtt and does not want to inject himself with Lovenox shots -Dr. Gurney Maxin to prescribe another 15 mg of Warfarin tonight; per  Dr. Doylene Canard wait until INR is between 1.8 and 2 and then can be transitioned back to 9 mg of Warfarin daily -Avoid Leafy Green Vegetables -Repeat PT/INR in AM  RUQ Pain/Lower Chest Pain r/o'd postoperative bile leak and SBO, much improved -General Surgery Consulted and signed off as HIDA scan negative for bile leak.  -Lipase also elevated 10/22 but trended down to 35.-Abdomen and Pelvis Flat Plate showed No acute findings. No evidence of bowel obstruction. -Pain improving. Has meds for pain. Continue to Moniotor if worsens.   Dilated Cardiomyopathy with prior MI -EF on Cardiac Cath was 25-35% by Visual Estimate (20-25% on Echo 06/2015) -Cw Isordil 20 mg po Daily, Carvedilol 12.5 mg po BID, Amlodipine 10 mg po Daily -No ACE/ARB because patient is allergic -Appreciate Additional Cardiac Recc's -Continue with Dialysis today to obtain EDW;  Hypertension -Continue to Monitor Vital Signs -Continue Norvasc, Coreg and Isordil.  DM Mellitus Type 2 Diet controlled.  Mild Hyperkalemia,  -Potassium was 5.2 -Will Dialyze today -Repeat BMP in AM  Mild Hyponatremia -Patient's Na+ was again 132 this AM -Repeat in AM  Anemia of Chronic Disease 2/2 to ESRD -Hb/Hct went from 11.0/33.4 -> 10.9/33.7 -> 11.9/36.3 -> 11.4/35.0 -Continue to Monitor CBC  ESRD on HD MWF -BUN/Cr was 46/11.96 this AM -Renal Diet with 1200 mL Fluid Restriction -Dialyzed MWF and Nephro following -Currently undergoing Dialysis -Continue Sensipar 90 mg po daily and Renvela 2,400 mg po TID.  Elevated Troponin r/o'd ACS likely 2/2 to ESRD -Troponin mildly elevated but flat at 0.21 -> 0.22 -> 0.24 -> 0.21 -C/w Isordil 20 mg po  Daily and with NG SL 0.4 mg q69minprn -Spoke with the patient's cardiologist, Dr. Doylene Canard who took him for cardiac catheterization today yesterday.   -Cardiac Catheterization showed  Prox LAD lesion, 25 % stenosed.  There is moderate left ventricular systolic dysfunction.  LV end diastolic  pressure is normal.  The left ventricular ejection fraction is 25-35% by visual estimate.  There is trivial (1+) mitral regurgitation.  -Continue IV heparin for now. -Continue Coumadin (dosed per Dr. Doylene Canard) with Heparin gtt as patient's INR is subtherapeutic  Gout -Continue Allopurinol 150 mg po Daily  DVT prophylaxis: Coumadin with Heparin gtt for bridging Code Status: Full Family Communication: No Family at Bedside Disposition Plan: Home at D/C.  Consultants:  Cardiology General Surgery Nephrology  Procedures: Hemodialysis. Cardiac Catheterization   Antimicrobials: None  Subjective: Patient was seen and examined this am in Dialysis and he was doing well. Stated that his abdominal pain only really comes when he has to use the bathroom. Had no CP/SOB/N/V or Lightheadedness. No other complaints or concerns at this time.   Objective: Vitals:   03/19/16 1230 03/19/16 1300 03/19/16 1330 03/19/16 1400  BP: 90/62 90/64 90/65    Pulse: 76 75 75 (P) 77  Resp:      Temp:      TempSrc:      SpO2:      Weight:      Height:        Intake/Output Summary (Last 24 hours) at 03/19/16 1521 Last data filed at 03/19/16 0730  Gross per 24 hour  Intake            798.5 ml  Output                0 ml  Net            798.5 ml   Filed Weights   03/18/16 0421 03/19/16 0340 03/19/16 0942  Weight: 111.5 kg (245 lb 13 oz) 113.1 kg (249 lb 5.4 oz) 110.9 kg (244 lb 7.8 oz)    Examination: Physical Exam:  Constitutional: WN/WD, NAD and appears calm and comfortable sitting receiving dialysis Eyes: Lids and conjunctivae normal, sclerae anicteric  ENMT: External Ears, Nose appear normal. Grossly normal hearing. Mucous membranes are moist.  Neck: Appears normal, supple, no cervical masses, normal ROM, no appreciable thyromegaly Respiratory: Clear to auscultation bilaterally, no wheezing, rales, rhonchi or crackles. Normal respiratory effort and patient is not tachypenic. No accessory  muscle use.  Cardiovascular: RRR, no murmurs / rubs / gallops. S1 and S2 auscultated. No extremity edema. 2+ pedal pulses. No carotid bruits.  Abdomen: Soft, nontender to palpation, distended 2/2 body habitus. No masses palpated. No appreciable hepatosplenomegaly. Bowel sounds positive.  GU: Deferred. Musculoskeletal: No clubbing / cyanosis of digits/nails. No joint deformity upper and lower extremities. Good ROM, no contractures.  Skin: No rashes, lesions, ulcers. No induration; Warm and dry. Abdominal scars noted from cholecystectomy. Left Arm AVF being used for Dialysis.  Neurologic: CN 2-12 grossly intact with no focal deficits. Sensation intact in all 4 Extremities. Romberg sign cerebellar reflexes not assessed.  Psychiatric: Normal judgment and insight. Alert and oriented x 3. Normal mood and appropriate affect.   Data Reviewed: I have personally reviewed following labs and imaging studies  CBC:  Recent Labs Lab 03/14/16 2214 03/15/16 1607 03/17/16 0659 03/18/16 0340 03/19/16 0443  WBC 4.7 3.9* 4.1 5.5 4.9  NEUTROABS 2.9  --   --  3.4  --   HGB 11.3* 11.0* 10.9* 11.9*  11.4*  HCT 34.6* 33.4* 33.7* 36.3* 35.0*  MCV 96.4 95.4 96.3 96.0 95.9  PLT 144* 140* 139* 134* 735*   Basic Metabolic Panel:  Recent Labs Lab 03/15/16 1606 03/16/16 1228 03/17/16 0659 03/18/16 0340 03/19/16 0443  NA 133* 134* 133* 132* 132*  K 6.1* 4.9 5.2* 4.9 5.2*  CL 99* 98* 96* 93* 93*  CO2 24 28 24 27 27   GLUCOSE 102* 90 96 87 92  BUN 60* 32* 43* 32* 46*  CREATININE 15.23* 10.49* 12.14* 9.28* 11.96*  CALCIUM 7.8* 8.4* 8.3* 8.9 8.5*  MG  --   --   --  2.2  --   PHOS 4.1 4.9* 5.7* 4.3  --    GFR: Estimated Creatinine Clearance: 9.4 mL/min (by C-G formula based on SCr of 11.96 mg/dL (H)). Liver Function Tests:  Recent Labs Lab 03/14/16 2214 03/15/16 1606 03/16/16 1228 03/17/16 0659 03/18/16 0340 03/19/16 0443  AST 13*  --   --   --  14* 21  ALT 12*  --   --   --  8* 24  ALKPHOS 101   --   --   --  128* 142*  BILITOT 0.9  --   --   --  0.7 0.6  PROT 7.3  --   --   --  8.0 7.5  ALBUMIN 3.4* 3.1* 3.3* 3.2* 3.5 3.4*    Recent Labs Lab 03/14/16 2214 03/16/16 1228  LIPASE 125* 35   No results for input(s): AMMONIA in the last 168 hours. Coagulation Profile:  Recent Labs Lab 03/14/16 2214 03/16/16 0853 03/18/16 0340 03/19/16 0443  INR 1.04 1.11 1.06 1.15   Cardiac Enzymes:  Recent Labs Lab 03/14/16 2214 03/15/16 0119 03/15/16 0437 03/15/16 1035 03/15/16 1607  TROPONINI 0.22* 0.21* 0.22* 0.24* 0.21*   BNP (last 3 results) No results for input(s): PROBNP in the last 8760 hours. HbA1C: No results for input(s): HGBA1C in the last 72 hours. CBG: No results for input(s): GLUCAP in the last 168 hours. Lipid Profile: No results for input(s): CHOL, HDL, LDLCALC, TRIG, CHOLHDL, LDLDIRECT in the last 72 hours. Thyroid Function Tests: No results for input(s): TSH, T4TOTAL, FREET4, T3FREE, THYROIDAB in the last 72 hours. Anemia Panel: No results for input(s): VITAMINB12, FOLATE, FERRITIN, TIBC, IRON, RETICCTPCT in the last 72 hours. Sepsis Labs: No results for input(s): PROCALCITON, LATICACIDVEN in the last 168 hours.  Recent Results (from the past 240 hour(s))  MRSA PCR Screening     Status: None   Collection Time: 03/16/16  2:50 AM  Result Value Ref Range Status   MRSA by PCR NEGATIVE NEGATIVE Final    Comment:        The GeneXpert MRSA Assay (FDA approved for NASAL specimens only), is one component of a comprehensive MRSA colonization surveillance program. It is not intended to diagnose MRSA infection nor to guide or monitor treatment for MRSA infections.     Radiology Studies: Dg Abd 1 View  Result Date: 03/17/2016 CLINICAL DATA:  Abdominal pain all over. History of hernia, diverticulitis and nephrectomy and renal insufficiency. EXAM: ABDOMEN - 1 VIEW COMPARISON:  CT, 03/15/2016 FINDINGS: There is no bowel dilation to suggest obstruction or  generalized adynamic ileus. There multiple surgical vascular clips in the right upper quadrant from a prior cholecystectomy and right nephrectomy. Soft tissues are otherwise unremarkable. IMPRESSION: 1. No acute findings.  No evidence of bowel obstruction. Electronically Signed   By: Lajean Manes M.D.   On: 03/17/2016 17:09  Scheduled Meds: . allopurinol  150 mg Oral Daily  . amiodarone  100 mg Oral Daily  . carvedilol  12.5 mg Oral Once per day on Mon Wed Fri  . carvedilol  12.5 mg Oral 2 times per day on Sun Tue Thu Sat  . cinacalcet  90 mg Oral Q breakfast  . doxercalciferol      . doxercalciferol  1 mcg Intravenous Q M,W,F-HD  . feeding supplement (NEPRO CARB STEADY)  237 mL Oral BID BM  . isosorbide dinitrate  20 mg Oral Daily  . multivitamin  1 tablet Oral QHS  . pantoprazole  40 mg Oral BID  . sevelamer carbonate  2,400 mg Oral TID WC  . sodium chloride flush  3 mL Intravenous Q12H  . sodium chloride flush  3 mL Intravenous Q12H  . warfarin  15 mg Oral ONCE-1800  . Warfarin - Physician Dosing Inpatient   Does not apply q1800   Continuous Infusions: . heparin 1,300 Units/hr (03/19/16 0600)     LOS: 4 days   Kerney Elbe, DO [facility] Pager [phone]  If 7PM-7AM, please contact night-coverage [email or web address] Password [email or web address] 03/19/2016, 3:21 PM

## 2016-03-19 NOTE — Progress Notes (Addendum)
ANTICOAGULATION CONSULT NOTE - Follow Up Consult  Pharmacy Consult for Heparin  Indication: chest pain/ACS and atrial fibrillation, s/p cath  Allergies  Allergen Reactions  . Ace Inhibitors Itching and Cough    Patient Measurements: Height: 6\' 1"  (185.4 cm) Weight: 249 lb 5.4 oz (113.1 kg) IBW/kg (Calculated) : 79.9  Vital Signs: Temp: 97.5 F (36.4 C) (10/27 0827) Temp Source: Oral (10/27 0827) BP: 134/79 (10/27 0827) Pulse Rate: 69 (10/27 0827)  Labs:  Recent Labs  03/17/16 0659 03/17/16 1201 03/18/16 0340 03/19/16 0443  HGB 10.9*  --  11.9* 11.4*  HCT 33.7*  --  36.3* 35.0*  PLT 139*  --  134* 142*  LABPROT  --   --  13.8 14.8  INR  --   --  1.06 1.15  HEPARINUNFRC  --  0.46 0.50 0.43  CREATININE 12.14*  --  9.28* 11.96*    Estimated Creatinine Clearance: 9.5 mL/min (by C-G formula based on SCr of 11.96 mg/dL (H)).   Assessment: Continues on heparin S/P cath 10/24. Coumadin PTA for Afib with subtherapeutic INR on admission of 1.04. Coumadin resumed 10/24 per MD dosing - INR 1.15  HL remains therapeutic at 0.43. CBC low stable - Hgb 11.4, plt 142. ESRD on HD MWF.   Home dose: 9mg  daily   Goal of Therapy:  Heparin level 0.3-0.7 units/ml Monitor platelets by anticoagulation protocol: Yes   Plan:  -Continue heparin at 1300 units/hr -Coumadin per MD -Daily HL -Monitor CBC, s/sx bleeding  Rober Minion, PharmD., MS Clinical Pharmacist Pager:  [phone] Thank you for allowing pharmacy to be part of this patients care team. 9:24 AM, 03/19/2016

## 2016-03-20 LAB — PROTIME-INR
INR: 1.49
Prothrombin Time: 18.1 seconds — ABNORMAL HIGH (ref 11.4–15.2)

## 2016-03-20 LAB — COMPREHENSIVE METABOLIC PANEL
ALT: 30 U/L (ref 17–63)
AST: 22 U/L (ref 15–41)
Albumin: 3.7 g/dL (ref 3.5–5.0)
Alkaline Phosphatase: 152 U/L — ABNORMAL HIGH (ref 38–126)
Anion gap: 14 (ref 5–15)
BUN: 27 mg/dL — ABNORMAL HIGH (ref 6–20)
CO2: 24 mmol/L (ref 22–32)
Calcium: 8.4 mg/dL — ABNORMAL LOW (ref 8.9–10.3)
Chloride: 93 mmol/L — ABNORMAL LOW (ref 101–111)
Creatinine, Ser: 8.98 mg/dL — ABNORMAL HIGH (ref 0.61–1.24)
GFR calc Af Amer: 7 mL/min — ABNORMAL LOW (ref >60)
GFR calc non Af Amer: 6 mL/min — ABNORMAL LOW (ref >60)
Glucose, Bld: 82 mg/dL (ref 65–99)
Potassium: 5.3 mmol/L — ABNORMAL HIGH (ref 3.5–5.1)
Sodium: 131 mmol/L — ABNORMAL LOW (ref 135–145)
Total Bilirubin: 0.5 mg/dL (ref 0.3–1.2)
Total Protein: 8.4 g/dL — ABNORMAL HIGH (ref 6.5–8.1)

## 2016-03-20 LAB — CBC
HCT: 37.5 % — ABNORMAL LOW (ref 39.0–52.0)
Hemoglobin: 12.4 g/dL — ABNORMAL LOW (ref 13.0–17.0)
MCH: 32 pg (ref 26.0–34.0)
MCHC: 33.1 g/dL (ref 30.0–36.0)
MCV: 96.6 fL (ref 78.0–100.0)
Platelets: 148 10*3/uL — ABNORMAL LOW (ref 150–400)
RBC: 3.88 MIL/uL — ABNORMAL LOW (ref 4.22–5.81)
RDW: 15.7 % — ABNORMAL HIGH (ref 11.5–15.5)
WBC: 7.1 10*3/uL (ref 4.0–10.5)

## 2016-03-20 LAB — PHOSPHORUS: Phosphorus: 3.7 mg/dL (ref 2.5–4.6)

## 2016-03-20 LAB — MAGNESIUM: Magnesium: 2.1 mg/dL (ref 1.7–2.4)

## 2016-03-20 LAB — HEPARIN LEVEL (UNFRACTIONATED): Heparin Unfractionated: 0.42 IU/mL (ref 0.30–0.70)

## 2016-03-20 MED ORDER — CARVEDILOL 3.125 MG PO TABS
3.1250 mg | ORAL_TABLET | ORAL | Status: DC
  Administered 2016-03-20 – 2016-03-21 (×2): 3.125 mg via ORAL
  Filled 2016-03-20 (×2): qty 1

## 2016-03-20 MED ORDER — WARFARIN SODIUM 7.5 MG PO TABS: 15.0000 mg | ORAL_TABLET | Freq: Once | ORAL | Status: DC

## 2016-03-20 MED ORDER — WARFARIN SODIUM 7.5 MG PO TABS
15.0000 mg | ORAL_TABLET | Freq: Once | ORAL | Status: AC
  Administered 2016-03-20: 15 mg via ORAL
  Filled 2016-03-20: qty 2

## 2016-03-20 NOTE — Progress Notes (Addendum)
ANTICOAGULATION CONSULT NOTE - Follow Up Consult  Pharmacy Consult for Heparin  Indication: chest pain/ACS and atrial fibrillation, s/p cath  Allergies  Allergen Reactions  . Ace Inhibitors Itching and Cough    Patient Measurements: Height: 6\' 1"  (185.4 cm) Weight: 252 lb 10.4 oz (114.6 kg) IBW/kg (Calculated) : 79.9  Vital Signs: Temp: 98.7 F (37.1 C) (10/28 0715) Temp Source: Oral (10/28 0715) BP: 106/73 (10/28 0715) Pulse Rate: 76 (10/28 0715)  Labs:  Recent Labs  03/18/16 0340 03/19/16 0443 03/20/16 0347  HGB 11.9* 11.4* 12.4*  HCT 36.3* 35.0* 37.5*  PLT 134* 142* 148*  LABPROT 13.8 14.8 18.1*  INR 1.06 1.15 1.49  HEPARINUNFRC 0.50 0.43 0.42  CREATININE 9.28* 11.96* 8.98*    Estimated Creatinine Clearance: 12.8 mL/min (by C-G formula based on SCr of 8.98 mg/dL (H)).   Assessment: Continues on heparin S/P cath 10/24. Coumadin PTA for Afib with subtherapeutic INR on admission of 1.04. Coumadin resumed 10/24 per MD dosing - INR up to 1.49 today  HL remains therapeutic at 0.42. CBC low stable - Hgb 12.4, plt 148. ESRD on HD MWF.   Home dose: 9mg  daily   Goal of Therapy:  Heparin level 0.3-0.7 units/ml Monitor platelets by anticoagulation protocol: Yes   Plan:  -Continue heparin at 1300 units/hr until INR > 1.8-2 per MD -Coumadin per MD -Daily HL -Monitor CBC, s/sx bleeding  Gwenlyn Perking, PharmD PGY1 Pharmacy Resident Pager: [phone] 03/20/2016 9:37 AM

## 2016-03-20 NOTE — Progress Notes (Signed)
PROGRESS NOTE    [name]  JSH:[id number] DOB: December 23, 1960 DOA: 03/14/2016 PCP: Maggie Font, MD  Brief Narrative:  [name] is an 55 y.o. male with a PMH of ESRD secondary to HTN with solitary kidney on HD MWF, biliary dyskinesia s/p laparoscopic lysis of adhesions and cholecystectomy with cholangiogram 02/12/16, atrial flutter on chronic coumadin (although INR low on admission, raising question of compliance), DM II, Obesity, HCC s/p right nephrectomy, chronic systolic CHF with an EF of 20-25% (per Echo 06/2015) and dilated non-ischemic cardiomyopathy who was admitted 03/14/16 with chief complaint of right upper quadrant abdominal pain and lower central chest pain. In the ED, his troponin was elevated at 0.2 to and a CT scan of his abdomen showed fluid in the subhepatic area with inflammation concerning for infection versus bile leak. General Surgery was consulted and evaluated and felt like his pain was not 2/2 to a bile leak. Cardiology was consulted because of CP and patient underwent Cardiac Catheterization. He is currently still in the hospital because his INR is subtherapeutic and he has refused Lovenox Bridging at current time. Patient was Transferred off of [place] to [facility] today.   Assessment & Plan:   Principal Problem:   Right upper quadrant abdominal pain Active Problems:   Hypertension, benign   DM II (diabetes mellitus, type II), controlled (HCC)   ESRD (end stage renal disease) on dialysis (HCC)   Bile leak, postoperative   Elevated troponin   Rib pain on right side  Atrial Flutter -Continue with Amiodarone 100 mg po Daily and with Carvediolol 12.5 mg po BID -Anticoagulated with Heparin gtt and with Warfarin Dosing per Cardiology -**INR is currently Subtherapeutic at 1.49 today and was subtherapeutic on admission -Discussed with Patient about Lovenox Bridging and he states he wants to continue with Heparin gtt and does not want to inject himself with Lovenox shots -Dr. Charolette Forward  to prescribe another 15 mg of Warfarin tonight; per Cardiology wait until INR is between 1.8 and 2 and then can be transitioned back to 9 mg of Warfarin daily -Avoid Leafy Green Vegetables -Repeat PT/INR in AM -Potential D/C in AM if INR close to 2  RUQ Pain/Lower Chest Pain r/o'd postoperative bile leak and SBO, much improved -General Surgery Consulted and signed off as HIDA scan negative for bile leak.  -Lipase also elevated 10/22 but trended down to 35.-Abdomen and Pelvis Flat Plate showed No acute findings. No evidence of bowel obstruction. -Pain improving. Has meds for pain. Continue to Moniotor if worsens.   Dilated Cardiomyopathy with prior MI -EF on Cardiac Cath was 25-35% by Visual Estimate (20-25% on Echo 06/2015) -Cw Isordil 20 mg po Daily, Carvedilol 12.5 mg po BID, Amlodipine 10 mg po Daily -No ACE/ARB because patient is allergic -Appreciate Additional Cardiac Recc's -Will have Dialysis MWF to obtain EDW;  Hypertension -Continue to Monitor Vital Signs -Continue Norvasc, Coreg and Isordil.  DM Mellitus Type 2 Diet controlled.  Mild Hyperkalemia,  -Potassium was 5.3 -Dialyzed yesterday -Repeat BMP in AM  Mild Hyponatremia -Patient's Na+ was 131 this Am, slightly decreased from 2 days prior -Repeat in AM  Anemia of Chronic Disease 2/2 to ESRD -Hb/Hct went from 11.0/33.4 -> 10.9/33.7 -> 11.9/36.3 -> 11.4/35.0 -> 12.4/37.5 -Continue to Monitor CBC  ESRD on HD MWF -BUN/Cr was 27/8.98 this AM -Renal Diet with 1200 mL Fluid Restriction -Dialyzed MWF and Nephro following -Currently undergoing Dialysis -Continue Sensipar 90 mg po daily and Renvela 2,400 mg po TID.  Elevated  Troponin r/o'd ACS likely 2/2 to ESRD -Troponin mildly elevated but flat at 0.21 -> 0.22 -> 0.24 -> 0.21 -C/w Isordil 20 mg po Daily and with NG SL 0.4 mg q61minprn -Spoke with the patient's cardiologist, Dr. Doylene Canard who took him for cardiac catheterization today yesterday.   -Cardiac  Catheterization showed  Prox LAD lesion, 25 % stenosed.  There is moderate left ventricular systolic dysfunction.  LV end diastolic pressure is normal.  The left ventricular ejection fraction is 25-35% by visual estimate.  There is trivial (1+) mitral regurgitation.  -Continue IV heparin for now. -Continue Coumadin (dosed per Dr. Doylene Canard) with Heparin gtt as patient's INR is subtherapeutic  Gout -Continue Allopurinol 150 mg po Daily  DVT prophylaxis: Coumadin with Heparin gtt for bridging Code Status: Full Family Communication: No Family at Bedside Disposition Plan: Home at D/C.  Consultants:  Cardiology General Surgery Nephrology  Procedures: Hemodialysis. Cardiac Catheterization   Antimicrobials: None  Subjective: Patient was seen and examined this AM and had a good night. Had no CP/SOB/N/V or Lightheadedness. No other complaints or concerns at this time while waiting for Coumadin to become Therapeutic.    Objective: Vitals:   03/19/16 1918 03/19/16 2000 03/20/16 0309 03/20/16 0715  BP: 101/65  111/74 106/73  Pulse: 80  76 76  Resp: 15 (!) 24 18 17   Temp: 97.2 F (36.2 C)  98.7 F (37.1 C) 98.7 F (37.1 C)  TempSrc: Axillary  Axillary Oral  SpO2: 96%  98% 98%  Weight:   114.6 kg (252 lb 10.4 oz)   Height:        Intake/Output Summary (Last 24 hours) at 03/20/16 0854 Last data filed at 03/20/16 0700  Gross per 24 hour  Intake            561.5 ml  Output             4000 ml  Net          -3438.5 ml   Filed Weights   03/19/16 0942 03/19/16 1414 03/20/16 0309  Weight: 110.9 kg (244 lb 7.8 oz) 107 kg (235 lb 14.3 oz) 114.6 kg (252 lb 10.4 oz)    Examination: Physical Exam:  Constitutional: WN/WD, NAD and appears calm and comfortable in bed Eyes: Lids and conjunctivae normal, sclerae anicteric  ENMT: External Ears, Nose appear normal. Grossly normal hearing. Mucous membranes are moist.  Neck: Appears normal, supple, no cervical masses, normal ROM, no  appreciable thyromegaly Respiratory: Clear to auscultation bilaterally, no wheezing, rales, rhonchi or crackles. Normal respiratory effort and patient is not tachypenic. No accessory muscle use.  Cardiovascular: RRR, no murmurs / rubs / gallops. S1 and S2 auscultated. No extremity edema. 2+ pedal pulses.  Abdomen: Soft, nontender to palpation, distended 2/2 body habitus. No masses palpated. No appreciable hepatosplenomegaly. Bowel sounds positive.  GU: Deferred. Musculoskeletal: No clubbing / cyanosis of digits/nails. No joint deformity upper and lower extremities. Good ROM, no contractures.  Skin: No rashes, lesions, ulcers. No induration; Warm and dry. Abdominal scars noted from cholecystectomy. Left Arm AVF with good thrill and bruit.   Neurologic: CN 2-12 grossly intact with no focal deficits. Sensation intact in all 4 Extremities. Romberg sign cerebellar reflexes not assessed.  Psychiatric: Normal judgment and insight. Alert and oriented x 3. Normal mood and appropriate affect.    Data Reviewed: I have personally reviewed following labs and imaging studies  CBC:  Recent Labs Lab 03/14/16 2214 03/15/16 1607 03/17/16 5188 03/18/16 0340 03/19/16 0443 03/20/16 4166  WBC 4.7 3.9* 4.1 5.5 4.9 7.1  NEUTROABS 2.9  --   --  3.4  --   --   HGB 11.3* 11.0* 10.9* 11.9* 11.4* 12.4*  HCT 34.6* 33.4* 33.7* 36.3* 35.0* 37.5*  MCV 96.4 95.4 96.3 96.0 95.9 96.6  PLT 144* 140* 139* 134* 142* 702*   Basic Metabolic Panel:  Recent Labs Lab 03/15/16 1606 03/16/16 1228 03/17/16 0659 03/18/16 0340 03/19/16 0443 03/20/16 0347  NA 133* 134* 133* 132* 132* 131*  K 6.1* 4.9 5.2* 4.9 5.2* 5.3*  CL 99* 98* 96* 93* 93* 93*  CO2 24 28 24 27 27 24   GLUCOSE 102* 90 96 87 92 82  BUN 60* 32* 43* 32* 46* 27*  CREATININE 15.23* 10.49* 12.14* 9.28* 11.96* 8.98*  CALCIUM 7.8* 8.4* 8.3* 8.9 8.5* 8.4*  MG  --   --   --  2.2  --  2.1  PHOS 4.1 4.9* 5.7* 4.3  --  3.7   GFR: Estimated Creatinine  Clearance: 12.8 mL/min (by C-G formula based on SCr of 8.98 mg/dL (H)). Liver Function Tests:  Recent Labs Lab 03/14/16 2214  03/16/16 1228 03/17/16 0659 03/18/16 0340 03/19/16 0443 03/20/16 0347  AST 13*  --   --   --  14* 21 22  ALT 12*  --   --   --  8* 24 30  ALKPHOS 101  --   --   --  128* 142* 152*  BILITOT 0.9  --   --   --  0.7 0.6 0.5  PROT 7.3  --   --   --  8.0 7.5 8.4*  ALBUMIN 3.4*  < > 3.3* 3.2* 3.5 3.4* 3.7  < > = values in this interval not displayed.  Recent Labs Lab 03/14/16 2214 03/16/16 1228  LIPASE 125* 35   No results for input(s): AMMONIA in the last 168 hours. Coagulation Profile:  Recent Labs Lab 03/14/16 2214 03/16/16 0853 03/18/16 0340 03/19/16 0443 03/20/16 0347  INR 1.04 1.11 1.06 1.15 1.49   Cardiac Enzymes:  Recent Labs Lab 03/14/16 2214 03/15/16 0119 03/15/16 0437 03/15/16 1035 03/15/16 1607  TROPONINI 0.22* 0.21* 0.22* 0.24* 0.21*   BNP (last 3 results) No results for input(s): PROBNP in the last 8760 hours. HbA1C: No results for input(s): HGBA1C in the last 72 hours. CBG: No results for input(s): GLUCAP in the last 168 hours. Lipid Profile: No results for input(s): CHOL, HDL, LDLCALC, TRIG, CHOLHDL, LDLDIRECT in the last 72 hours. Thyroid Function Tests: No results for input(s): TSH, T4TOTAL, FREET4, T3FREE, THYROIDAB in the last 72 hours. Anemia Panel: No results for input(s): VITAMINB12, FOLATE, FERRITIN, TIBC, IRON, RETICCTPCT in the last 72 hours. Sepsis Labs: No results for input(s): PROCALCITON, LATICACIDVEN in the last 168 hours.  Recent Results (from the past 240 hour(s))  MRSA PCR Screening     Status: None   Collection Time: 03/16/16  2:50 AM  Result Value Ref Range Status   MRSA by PCR NEGATIVE NEGATIVE Final    Comment:        The GeneXpert MRSA Assay (FDA approved for NASAL specimens only), is one component of a comprehensive MRSA colonization surveillance program. It is not intended to diagnose  MRSA infection nor to guide or monitor treatment for MRSA infections.     Radiology Studies: No results found. Scheduled Meds: . allopurinol  150 mg Oral Daily  . amiodarone  100 mg Oral Daily  . carvedilol  12.5 mg Oral Once per day  on Mon Wed Fri  . carvedilol  12.5 mg Oral 2 times per day on Sun Tue Thu Sat  . cinacalcet  90 mg Oral Q breakfast  . doxercalciferol  1 mcg Intravenous Q M,W,F-HD  . feeding supplement (NEPRO CARB STEADY)  237 mL Oral BID BM  . isosorbide dinitrate  20 mg Oral Daily  . multivitamin  1 tablet Oral QHS  . pantoprazole  40 mg Oral BID  . sevelamer carbonate  2,400 mg Oral TID WC  . sodium chloride flush  3 mL Intravenous Q12H  . sodium chloride flush  3 mL Intravenous Q12H  . Warfarin - Physician Dosing Inpatient   Does not apply q1800   Continuous Infusions: . heparin 1,300 Units/hr (03/19/16 2332)    LOS: 5 days   Kerney Elbe, DO [facility] Pager [phone]  If 7PM-7AM, please contact night-coverage [email or web address] Password [facility] 03/20/2016, 8:54 AM

## 2016-03-20 NOTE — Discharge Instructions (Signed)
If you were given medicines take as directed.  If you are on coumadin or contraceptives realize their levels and effectiveness is altered by many different medicines.  If you have any reaction (rash, tongues swelling, other) to the medicines stop taking and see a physician.    If your blood pressure was elevated in the ER make sure you follow up for management with a primary doctor or return for chest pain, shortness of breath or stroke symptoms.  Please follow up as directed and return to the ER or see a physician for new or worsening symptoms.  Thank you. Vitals:   03/14/16 2145 03/14/16 2230 03/15/16 0027 03/15/16 0030  BP: 154/89 143/90 153/93 154/90  Pulse: 80 71 77 74  Resp: 16 17 14 14   Temp:      TempSrc:      SpO2: 97% 98% 95% 97%  Weight:      Height:         Information on my medicine - Coumadin   (Warfarin)  This medication education was reviewed with me or my healthcare representative as part of my discharge preparation.    Why was Coumadin prescribed for you? Coumadin was prescribed for you because you have a blood clot or a medical condition that can cause an increased risk of forming blood clots. Blood clots can cause serious health problems by blocking the flow of blood to the heart, lung, or brain. Coumadin can prevent harmful blood clots from forming. As a reminder your indication for Coumadin is:   Stroke Prevention Because Of Atrial Fibrillation  What test will check on my response to Coumadin? While on Coumadin (warfarin) you will need to have an INR test regularly to ensure that your dose is keeping you in the desired range. The INR (international normalized ratio) number is calculated from the result of the laboratory test called prothrombin time (PT).  If an INR APPOINTMENT HAS NOT ALREADY BEEN MADE FOR YOU please schedule an appointment to have this lab work done by your health care provider within 7 days. Your INR goal is usually a number between:  2 to 3 or  your provider may give you a more narrow range like 2-2.5.  Ask your health care provider during an office visit what your goal INR is.  What  do you need to  know  About  COUMADIN? Take Coumadin (warfarin) exactly as prescribed by your healthcare provider about the same time each day.  DO NOT stop taking without talking to the doctor who prescribed the medication.  Stopping without other blood clot prevention medication to take the place of Coumadin may increase your risk of developing a new clot or stroke.  Get refills before you run out.  What do you do if you miss a dose? If you miss a dose, take it as soon as you remember on the same day then continue your regularly scheduled regimen the next day.  Do not take two doses of Coumadin at the same time.  Important Safety Information A possible side effect of Coumadin (Warfarin) is an increased risk of bleeding. You should call your healthcare provider right away if you experience any of the following: ? Bleeding from an injury or your nose that does not stop. ? Unusual colored urine (red or dark brown) or unusual colored stools (red or black). ? Unusual bruising for unknown reasons. ? A serious fall or if you hit your head (even if there is no bleeding).  Some foods  or medicines interact with Coumadin (warfarin) and might alter your response to warfarin. To help avoid this: ? Eat a balanced diet, maintaining a consistent amount of Vitamin K. ? Notify your provider about major diet changes you plan to make. ? Avoid alcohol or limit your intake to 1 drink for women and 2 drinks for men per day. (1 drink is 5 oz. wine, 12 oz. beer, or 1.5 oz. liquor.)  Make sure that ANY health care provider who prescribes medication for you knows that you are taking Coumadin (warfarin).  Also make sure the healthcare provider who is monitoring your Coumadin knows when you have started a new medication including herbals and non-prescription products.  Coumadin  (Warfarin)  Major Drug Interactions  Increased Warfarin Effect Decreased Warfarin Effect  Alcohol (large quantities) Antibiotics (esp. Septra/Bactrim, Flagyl, Cipro) Amiodarone (Cordarone) Aspirin (ASA) Cimetidine (Tagamet) Megestrol (Megace) NSAIDs (ibuprofen, naproxen, etc.) Piroxicam (Feldene) Propafenone (Rythmol SR) Propranolol (Inderal) Isoniazid (INH) Posaconazole (Noxafil) Barbiturates (Phenobarbital) Carbamazepine (Tegretol) Chlordiazepoxide (Librium) Cholestyramine (Questran) Griseofulvin Oral Contraceptives Rifampin Sucralfate (Carafate) Vitamin K   Coumadin (Warfarin) Major Herbal Interactions  Increased Warfarin Effect Decreased Warfarin Effect  Garlic Ginseng Ginkgo biloba Coenzyme Q10 Green tea [place] wort    Coumadin (Warfarin) FOOD Interactions  Eat a consistent number of servings per week of foods HIGH in Vitamin K (1 serving =  cup)  Collards (cooked, or boiled & drained) Kale (cooked, or boiled & drained) Mustard greens (cooked, or boiled & drained) Parsley *serving size only =  cup Spinach (cooked, or boiled & drained) Swiss chard (cooked, or boiled & drained) Turnip greens (cooked, or boiled & drained)  Eat a consistent number of servings per week of foods MEDIUM-HIGH in Vitamin K (1 serving = 1 cup)  Asparagus (cooked, or boiled & drained) Broccoli (cooked, boiled & drained, or raw & chopped) Brussel sprouts (cooked, or boiled & drained) *serving size only =  cup Lettuce, raw (green leaf, endive, romaine) Spinach, raw Turnip greens, raw & chopped   These websites have more information on Coumadin (warfarin):  FailFactory.se; VeganReport.com.au;

## 2016-03-20 NOTE — Progress Notes (Signed)
Subjective:  Denies any chest pain or shortness of breath tolerating IV heparin no obvious bleeding. INR still remains subtherapeutic  Objective:  Vital Signs in the last 24 hours: Temp:  [97.2 F (36.2 C)-98.7 F (37.1 C)] 98.7 F (37.1 C) (10/28 0715) Pulse Rate:  [63-80] 76 (10/28 0715) Resp:  [15-24] 17 (10/28 0715) BP: (90-111)/(62-74) 106/73 (10/28 0715) SpO2:  [96 %-98 %] 98 % (10/28 0715) Weight:  [235 lb 14.3 oz (107 kg)-252 lb 10.4 oz (114.6 kg)] 252 lb 10.4 oz (114.6 kg) (10/28 0309)  Intake/Output from previous day: 10/27 0701 - 10/28 0700 In: 581 [P.O.:360; I.V.:221] Out: 4000  Intake/Output from this shift: No intake/output data recorded.  Physical Exam: Neck: no adenopathy, no carotid bruit, no JVD and supple, symmetrical, trachea midline Lungs: clear to auscultation bilaterally Heart: regular rate and rhythm, S1, S2 normal and 2/6 systolic murmur noted Abdomen: soft, non-tender; bowel sounds normal; no masses,  no organomegaly Extremities: extremities normal, atraumatic, no cyanosis or edema and Right groin stable  Lab Results:  Recent Labs  03/19/16 0443 03/20/16 0347  WBC 4.9 7.1  HGB 11.4* 12.4*  PLT 142* 148*    Recent Labs  03/19/16 0443 03/20/16 0347  NA 132* 131*  K 5.2* 5.3*  CL 93* 93*  CO2 27 24  GLUCOSE 92 82  BUN 46* 27*  CREATININE 11.96* 8.98*   No results for input(s): TROPONINI in the last 72 hours.  Invalid input(s): CK, MB Hepatic Function Panel  Recent Labs  03/20/16 0347  PROT 8.4*  ALBUMIN 3.7  AST 22  ALT 30  ALKPHOS 152*  BILITOT 0.5   No results for input(s): CHOL in the last 72 hours. No results for input(s): PROTIME in the last 72 hours.  Imaging: Imaging results have been reviewed and No results found.  Cardiac Studies:  Assessment/Plan:  Nonobstructive coronary artery disease status post left cardiac catheterization Nonischemic Dilated cardiomyopathy S/P GB surgery Obesity Anemia od chronic  disease ESRD DM, II Paroxysmal Atrial flutter, [id number] score of 5 Plan Continue Present management Check PT/INR in a.m. possible discharge tomorrow if INR around 2  LOS: 5 days    Charolette Forward 03/20/2016, 10:17 AM

## 2016-03-20 NOTE — Progress Notes (Signed)
Subjective: Interval History: has complaints , wants to go home.  Objective: Vital signs in last 24 hours: Temp:  [97.2 F (36.2 C)-98.7 F (37.1 C)] 98.7 F (37.1 C) (10/28 0715) Pulse Rate:  [63-80] 76 (10/28 0715) Resp:  [12-24] 17 (10/28 0715) BP: (90-128)/(62-79) 106/73 (10/28 0715) SpO2:  [96 %-98 %] 98 % (10/28 0715) Weight:  [107 kg (235 lb 14.3 oz)-114.6 kg (252 lb 10.4 oz)] 114.6 kg (252 lb 10.4 oz) (10/28 0309) Weight change: -2.2 kg (-4 lb 13.6 oz)  Intake/Output from previous day: 10/27 0701 - 10/28 0700 In: 581 [P.O.:360; I.V.:221] Out: 4000  Intake/Output this shift: No intake/output data recorded.  General appearance: alert, cooperative, no distress and moderately obese Resp: clear to auscultation bilaterally Cardio: regular rate and rhythm and systolic murmur: holosystolic 2/6, blowing at apex GI: obese, pos bs, liver down 4 cm Extremities: edema none and AVF LLA B&T  Lab Results:  Recent Labs  03/19/16 0443 03/20/16 0347  WBC 4.9 7.1  HGB 11.4* 12.4*  HCT 35.0* 37.5*  PLT 142* 148*   BMET:  Recent Labs  03/19/16 0443 03/20/16 0347  NA 132* 131*  K 5.2* 5.3*  CL 93* 93*  CO2 27 24  GLUCOSE 92 82  BUN 46* 27*  CREATININE 11.96* 8.98*  CALCIUM 8.5* 8.4*   No results for input(s): PTH in the last 72 hours. Iron Studies: No results for input(s): IRON, TIBC, TRANSFERRIN, FERRITIN in the last 72 hours.  Studies/Results: No results found.  I have reviewed the patient's current medications.  Assessment/Plan: 1 ESRD doing well, had HD yest 2 Anemia at goal 3 HPTH on meds 4 Obesity issue long term 5 CAD not signif 6 Aflutter. In sinus 7 HTN lower bps, lower meds P Hd MWF, lower bp med Getting anticoag not sure needs to be here as can be done as outpatient with Coumadin clinic, and questionable value to anticoag   LOS: 5 days   Mandela Bello L 03/20/2016,9:21 AM

## 2016-03-21 LAB — COMPREHENSIVE METABOLIC PANEL
ALT: 28 U/L (ref 17–63)
AST: 22 U/L (ref 15–41)
Albumin: 3.3 g/dL — ABNORMAL LOW (ref 3.5–5.0)
Alkaline Phosphatase: 161 U/L — ABNORMAL HIGH (ref 38–126)
Anion gap: 16 — ABNORMAL HIGH (ref 5–15)
BUN: 39 mg/dL — ABNORMAL HIGH (ref 6–20)
CO2: 22 mmol/L (ref 22–32)
Calcium: 8.7 mg/dL — ABNORMAL LOW (ref 8.9–10.3)
Chloride: 94 mmol/L — ABNORMAL LOW (ref 101–111)
Creatinine, Ser: 11.8 mg/dL — ABNORMAL HIGH (ref 0.61–1.24)
GFR calc Af Amer: 5 mL/min — ABNORMAL LOW (ref >60)
GFR calc non Af Amer: 4 mL/min — ABNORMAL LOW (ref >60)
Glucose, Bld: 73 mg/dL (ref 65–99)
Potassium: 5.3 mmol/L — ABNORMAL HIGH (ref 3.5–5.1)
Sodium: 132 mmol/L — ABNORMAL LOW (ref 135–145)
Total Bilirubin: 0.4 mg/dL (ref 0.3–1.2)
Total Protein: 7.7 g/dL (ref 6.5–8.1)

## 2016-03-21 LAB — CBC
HCT: 38 % — ABNORMAL LOW (ref 39.0–52.0)
Hemoglobin: 12.1 g/dL — ABNORMAL LOW (ref 13.0–17.0)
MCH: 31.2 pg (ref 26.0–34.0)
MCHC: 31.8 g/dL (ref 30.0–36.0)
MCV: 97.9 fL (ref 78.0–100.0)
Platelets: 134 10*3/uL — ABNORMAL LOW (ref 150–400)
RBC: 3.88 MIL/uL — ABNORMAL LOW (ref 4.22–5.81)
RDW: 16.1 % — ABNORMAL HIGH (ref 11.5–15.5)
WBC: 8 10*3/uL (ref 4.0–10.5)

## 2016-03-21 LAB — PROTIME-INR
INR: 2.08
Prothrombin Time: 23.8 seconds — ABNORMAL HIGH (ref 11.4–15.2)

## 2016-03-21 LAB — HEPARIN LEVEL (UNFRACTIONATED): Heparin Unfractionated: 0.42 IU/mL (ref 0.30–0.70)

## 2016-03-21 MED ORDER — NEPRO/CARBSTEADY PO LIQD: 237.0000 mL | Freq: Two times a day (BID) | ORAL | 0 refills | Status: DC

## 2016-03-21 MED ORDER — RENA-VITE PO TABS: 1.0000 | ORAL_TABLET | Freq: Every day | ORAL | 0 refills | Status: DC

## 2016-03-21 MED ORDER — CARVEDILOL 3.125 MG PO TABS
3.1250 mg | ORAL_TABLET | Freq: Two times a day (BID) | ORAL | 0 refills | Status: DC
Start: 2016-03-21 — End: 2017-03-17

## 2016-03-21 NOTE — Discharge Summary (Signed)
Physician Discharge Summary  [name] OEU:[id number] DOB: February 02, 1961 DOA: 03/14/2016  PCP: Maggie Font, MD  Admit date: 03/14/2016 Discharge date: 03/21/2016  Admitted From: Home  Disposition:  Home  Recommendations for Outpatient Follow-up:  1. Follow up with PCP in 1-2 weeks 2. Follow up with Cardiology Dr. Doylene Canard as an outpatient in 1-2 weeks 3. Follow up with Normal Dialysis Schedule MWF at [facility] and with Nephrology 4. 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 In the ED, his troponin was elevated at 0.2 to and a CT scan of his abdomen showed fluid in the subhepatic area with inflammation concerning for infection versus bile leak. General Surgery was consulted and evaluated and felt like his pain was not 2/2 to a bile leak. Cardiology was consulted because of CP and patient underwent Cardiac Catheterization which did not show anything different. He was still in the hospital because his INR was subtherapeutic and he has refused Lovenox Bridging at current time. INR became therapeutic today at 2.08 and patient was stable for D/C/   Discharge Diagnoses:  Principal Problem:   Right upper quadrant abdominal pain Active Problems:    Hypertension, benign   DM II (diabetes mellitus, type II), controlled (HCC)   ESRD (end stage renal disease) on dialysis (HCC)   Bile leak, postoperative   Elevated troponin   Rib pain on right side  Atrial Flutter -Continue with Amiodarone 100 mg po Daily and with Carvediolol 12.5 mg po BID -Anticoagulated with Heparin gtt and with Warfarin Dosing per Cardiology -**INR is Now Therapeutic at 2.08 and was subtherapeutic on admission -Discussed with Patient about Lovenox Bridging and he states he wants to continue with Heparin gtt and does not want to inject himself with Lovenox shots -Avoid Leafy Green Vegetables -C/w Home Warfarin Dose of 9 mg daily -Follow up with Cardiology Dr. Doylene Canard and Monitor PT/INR as an outpatient.   RUQ Pain/Lower Chest Pain r/o'd postoperative bile leak and SBO, much improved -General Surgery Consulted and signed off as HIDA scan negative for bile leak.  -Lipase also elevated 10/22 but trended down to 35.-Abdomen and Pelvis Flat Plate showed No acute findings. No evidence of bowel obstruction. -Pain Resolved.   Dilated Cardiomyopathy with prior MI -EF on Cardiac Cath was 25-35% by Visual Estimate (20-25% on Echo 06/2015) -Cw Isordil 20 mg po Daily, Carvedilol 12.5 mg po BID, Amlodipine 10 mg po Daily -No ACE/ARB because patient is allergic -Will have Dialysis MWF to obtain EDW;  -Follow up with Cardiology as an outpatient.   Hypertension -Continue to Monitor Vital Signs -Continue Norvasc, Coreg and Isordil.  DM Mellitus Type 2 Diet controlled.  Mild Hyperkalemia,  -Potassium was 5.3 -Dialyzed yesterday -Repeat BMP as an Outpatient at [facility].   Mild Hyponatremia -Patient's Na+ was 132 this Am; Been Stable -Repeat BMP as an Outpatient at  PCP's Office.   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   ESRD on HD MWF -Renal Diet with 1200 mL Fluid Restriction -Dialyzed  MWF  -Continue Sensipar 90 mg po daily and Renvela 2,400 mg po TID. -Continue Going to Dialysis MWF -Follow up with Nephrology as an Outpatient  Elevated Troponin r/o'd ACS likely 2/2 to ESRD -Troponin mildly elevated but flat at 0.21 -> 0.22 -> 0.24 -> 0.21 -C/w Isordil 20 mg po Daily and with NG SL 0.4 mg q51minprn Cardiac Cath showed:   -Cardiac Catheterization showed  Prox LAD lesion, 25 % stenosed.  There is moderate left ventricular systolic dysfunction.  LV end diastolic pressure is normal.  The left ventricular ejection fraction is 25-35% by visual estimate.  There is trivial (1+) mitral regurgitation.  -Continue IV heparin for now.  Gout -Continue Allopurinol 150 mg po Daily  Discharge Instructions  Discharge Instructions    Diet - low sodium heart healthy    Complete by:  As directed    Along with Renal Dialysis Diet.   Discharge instructions    Complete by:  As directed    Follow up with PCP Dr. Berdine Addison and with Cardiology Dr. Doylene Canard and with Nephrology. Take all medications as prescribed. If symptoms change or worsen please return to the ER for Evaluation.   Increase activity slowly    Complete by:  As directed        Medication List    TAKE these medications   allopurinol 300 MG tablet Commonly known as:  ZYLOPRIM Take 150 mg by mouth daily.   amiodarone 200 MG tablet Commonly known as:  PACERONE Take 0.5 tablets (100 mg total) by mouth daily.   amLODipine 5 MG tablet Commonly known as:  NORVASC Take 5 mg by mouth daily.   carvedilol 3.125 MG tablet Commonly known as:  COREG Take 1 tablet (3.125 mg total) by mouth 2 (two) times daily with a meal. What changed:  medication strength  how much to take  when to take this  additional instructions   cinacalcet 90 MG tablet Commonly known as:  SENSIPAR Take 90 mg by mouth daily.   feeding supplement (NEPRO CARB STEADY) Liqd Take 237 mLs by mouth 2 (two) times daily between meals.    isosorbide dinitrate 20 MG tablet Commonly known as:  ISORDIL Take 20 mg by mouth daily.   lidocaine-prilocaine cream Commonly known as:  EMLA Apply 1 application topically every Monday, Wednesday, and Friday. Apply to port access prior to dialysis   multivitamin Tabs tablet Take 1 tablet by mouth at bedtime.   nitroGLYCERIN 0.4 MG SL tablet Commonly known as:  NITROSTAT Place 0.4 mg under the tongue every 5 (five) minutes as needed for chest pain.   oxyCODONE-acetaminophen 5-325 MG tablet Commonly known as:  PERCOCET/ROXICET Take 1-2 tablets by mouth every 4 (four) hours as needed for moderate pain.   pantoprazole 40 MG tablet Commonly known as:  PROTONIX Take 1 tablet (40 mg total) by mouth 2 (two) times daily.   sevelamer carbonate 800 MG tablet Commonly known as:  RENVELA Take 3 tablets (2,400 mg total) by mouth 3 (three) times daily with meals.   VITAMIN D PO Take 1 tablet by mouth daily.   warfarin 6 MG tablet Commonly known as:  COUMADIN Take 9 mg by mouth daily.      Follow-up Information    Maggie Font, MD. Call in  2 day(s).   Specialty:  Family Medicine Contact information: [place] [address] [place] [address] [phone]        Birdie Riddle, MD. Schedule an appointment as soon as possible for a visit in 1 week(s).   Specialty:  Cardiology Contact information: [place] [address] [phone]          Allergies  Allergen Reactions  . Ace Inhibitors Itching and Cough    Consultations:  General Surgery  Cardiology  Nephrology  Procedures/Studies: Dg Chest 2 View  Result Date: 03/14/2016 CLINICAL DATA:  Shortness of breath EXAM: CHEST  2 VIEW COMPARISON:  None. FINDINGS: Slight elevation of the left diaphragm. There is mild to moderate enlargement of the cardiomediastinal silhouette. There is mild central vascular congestion. No pleural effusion or focal consolidation. Mild atherosclerosis of the  aorta. No pneumothorax. IMPRESSION: Stable cardiomegaly with mild central vascular congestion. No overt edema, consolidation or effusion. Atherosclerotic vascular disease of the aorta. Electronically Signed   By: Donavan Foil M.D.   On: 03/14/2016 23:41   Dg Chest 2 View  Result Date: 03/14/2016 CLINICAL DATA:  Congestion for 5 days with productive cough, vomiting, nausea, right scapular pain, and right rib pain. EXAM: CHEST  2 VIEW COMPARISON:  09/07/2015 FINDINGS: Enlargement of the cardiac silhouette is unchanged. There is mild prominence of the central pulmonary arteries with decreased pulmonary vascular congestion compared to the prior study. There is no evidence confluent airspace opacity, edema, pleural effusion, or pneumothorax. No acute osseous abnormality is identified. IMPRESSION: Cardiomegaly with decreased pulmonary vascular congestion. No evidence of edema or pneumonia. Electronically Signed   By: Logan Bores M.D.   On: 03/14/2016 18:53   Dg Abd 1 View  Result Date: 03/17/2016 CLINICAL DATA:  Abdominal pain all over. History of hernia, diverticulitis and nephrectomy and renal insufficiency. EXAM: ABDOMEN - 1 VIEW COMPARISON:  CT, 03/15/2016 FINDINGS: There is no bowel dilation to suggest obstruction or generalized adynamic ileus. There multiple surgical vascular clips in the right upper quadrant from a prior cholecystectomy and right nephrectomy. Soft tissues are otherwise unremarkable. IMPRESSION: 1. No acute findings.  No evidence of bowel obstruction. Electronically Signed   By: Lajean Manes M.D.   On: 03/17/2016 17:09   Ct Angio Chest Pe W And/or Wo Contrast  Result Date: 03/15/2016 CLINICAL DATA:  55 year old male with back pain and pleuritic chest pain. EXAM: CT ANGIOGRAPHY CHEST CT ABDOMEN AND PELVIS WITH CONTRAST TECHNIQUE: Multidetector CT imaging of the chest was performed using the standard protocol during bolus administration of intravenous contrast. Multiplanar CT image  reconstructions and MIPs were obtained to evaluate the vascular anatomy. Multidetector CT imaging of the abdomen and pelvis was performed using the standard protocol during bolus administration of intravenous contrast. CONTRAST:  80 cc Isovue 370 COMPARISON:  CT of the abdomen pelvis dated 09/07/2015 FINDINGS: CTA CHEST FINDINGS Cardiovascular: The thoracic aorta appears unremarkable. There is no aneurysmal dilatation or evidence of dissection. The origins of the great vessels of the aortic arch appear patent. There is non opacification of the left subclavian artery, likely related to timing of the contrast. There is mild dilatation of the main pulmonary trunk which may be represent a degree of pulmonary hypertension. Evaluation of the pulmonary arteries is limited due to respiratory motion artifact as well as streak artifact caused by patient's arms. No definite large central pulmonary artery embolus identified. There is moderate cardiomegaly. Multi vessel coronary vascular disease. No pericardial effusion. Mediastinum/Nodes: There  is no hilar or mediastinal adenopathy. The esophagus and the thyroid gland are grossly unremarkable. Lungs/Pleura: The lungs are clear. There is no pleural effusion or pneumothorax. The central airways are patent. Musculoskeletal: Degenerative changes of the spine. No acute fracture. Review of the MIP images confirms the above findings. CT ABDOMEN and PELVIS FINDINGS No intra-abdominal free air.  Small subhepatic free fluid. Hepatobiliary: A 9 mm left hepatic hypodense lesion is not well characterized but appears similar to prior CT and may represent a cyst or hemangioma. The liver is otherwise unremarkable. Mild intrahepatic biliary ductal dilatation. There is postsurgical changes of cholecystectomy. There is a small amount of fluid as well as inflammatory changes of the cholecystectomy bed. Although these may be postsurgical, findings are concerning for an infectious process or bile  leak 1 month postoperative. Correlation with clinical exam recommended. A hepatobiliary scintigraphy may provide better evaluation and better assessment for possibility of bile leak. No discrete drainable fluid collection identified at this time. No retained CBD stone noted. Pancreas: Unremarkable. No pancreatic ductal dilatation or surrounding inflammatory changes. Spleen: Normal in size without focal abnormality. Adrenals/Urinary Tract: The adrenal glands appear unremarkable. Status post prior right nephrectomy. There is marked left renal atrophy. Innumerable left renal hypodense lesions are not well characterized on this CT but may represent cysts. MRI or ultrasound may provide better characterization. This findings is similar to prior CT. There is no hydronephrosis on the left. No stone identified. The visualized ureter appears unremarkable. The urinary bladder is collapsed. Stomach/Bowel: There is sigmoid diverticulosis without active inflammatory changes. Scattered colonic diverticula in the proximal transverse as well as in the ascending colon noted. Mild haziness of the diverticula at the hepatic flexure, likely secondary to extension of inflammatory changes of the cholecystectomy. Diverticulitis is less likely. There is no evidence of bowel obstruction. Normal appendix. Vascular/Lymphatic: Mild aortoiliac atherosclerotic disease. There is no aneurysmal dilatation or evidence of dissection. The origins of the celiac axis, SMA, IMA appear patent. The IVC appears patent. No portal venous gas identified. The SMV, splenic vein, and main portal vein are patent. There is no adenopathy. Reproductive: The prostate and seminal vesicles are grossly unremarkable. Other: Small fat containing umbilical hernia. Right anterior abdominal wall hernia repair mesh. Musculoskeletal: Degenerative changes of the spine. No acute fracture. Review of the MIP images confirms the above findings. IMPRESSION: No CT evidence of aortic  dissection or central pulmonary artery embolus. Inflammatory changes of the cholecystectomy with small amount of fluid in the subhepatic area concerning for an infectious process or bile leak. Correlation with clinical exam recommended. A hepatobiliary scintigraphy may provide better evaluation and assistant for possible chronic. No drainable fluid collection. Colonic diverticulosis without active inflammatory changes. No evidence of bowel obstruction. Normal appendix. Electronically Signed   By: Anner Crete M.D.   On: 03/15/2016 03:35   Nm Hepatobiliary Liver Func  Result Date: 03/15/2016 CLINICAL DATA:  Cholecystectomy 02/12/2016. RIGHT-sided abdominal pain and back pain. EXAM: NUCLEAR MEDICINE HEPATOBILIARY IMAGING TECHNIQUE: Sequential images of the abdomen were obtained out to 60 minutes following intravenous administration of radiopharmaceutical. RADIOPHARMACEUTICALS:  5.0 mCi Tc-35m  Choletec IV COMPARISON:  CT 03/15/2016 FINDINGS: Uniform uptake of radiotracer within the liver. Counts are evident within the common bile duct and small bowel by 30 minutes. There is no evidence of extra biliary radiotracer to suggest bile leak. IMPRESSION: 1. No evidence of bile leak. 2. Patent common bile duct. Electronically Signed   By: Suzy Bouchard M.D.   On: 03/15/2016 11:15  Ct Abdomen Pelvis W Contrast  Result Date: 03/15/2016 CLINICAL DATA:  55 year old male with back pain and pleuritic chest pain. EXAM: CT ANGIOGRAPHY CHEST CT ABDOMEN AND PELVIS WITH CONTRAST TECHNIQUE: Multidetector CT imaging of the chest was performed using the standard protocol during bolus administration of intravenous contrast. Multiplanar CT image reconstructions and MIPs were obtained to evaluate the vascular anatomy. Multidetector CT imaging of the abdomen and pelvis was performed using the standard protocol during bolus administration of intravenous contrast. CONTRAST:  80 cc Isovue 370 COMPARISON:  CT of the abdomen  pelvis dated 09/07/2015 FINDINGS: CTA CHEST FINDINGS Cardiovascular: The thoracic aorta appears unremarkable. There is no aneurysmal dilatation or evidence of dissection. The origins of the great vessels of the aortic arch appear patent. There is non opacification of the left subclavian artery, likely related to timing of the contrast. There is mild dilatation of the main pulmonary trunk which may be represent a degree of pulmonary hypertension. Evaluation of the pulmonary arteries is limited due to respiratory motion artifact as well as streak artifact caused by patient's arms. No definite large central pulmonary artery embolus identified. There is moderate cardiomegaly. Multi vessel coronary vascular disease. No pericardial effusion. Mediastinum/Nodes: There is no hilar or mediastinal adenopathy. The esophagus and the thyroid gland are grossly unremarkable. Lungs/Pleura: The lungs are clear. There is no pleural effusion or pneumothorax. The central airways are patent. Musculoskeletal: Degenerative changes of the spine. No acute fracture. Review of the MIP images confirms the above findings. CT ABDOMEN and PELVIS FINDINGS No intra-abdominal free air.  Small subhepatic free fluid. Hepatobiliary: A 9 mm left hepatic hypodense lesion is not well characterized but appears similar to prior CT and may represent a cyst or hemangioma. The liver is otherwise unremarkable. Mild intrahepatic biliary ductal dilatation. There is postsurgical changes of cholecystectomy. There is a small amount of fluid as well as inflammatory changes of the cholecystectomy bed. Although these may be postsurgical, findings are concerning for an infectious process or bile leak 1 month postoperative. Correlation with clinical exam recommended. A hepatobiliary scintigraphy may provide better evaluation and better assessment for possibility of bile leak. No discrete drainable fluid collection identified at this time. No retained CBD stone noted.  Pancreas: Unremarkable. No pancreatic ductal dilatation or surrounding inflammatory changes. Spleen: Normal in size without focal abnormality. Adrenals/Urinary Tract: The adrenal glands appear unremarkable. Status post prior right nephrectomy. There is marked left renal atrophy. Innumerable left renal hypodense lesions are not well characterized on this CT but may represent cysts. MRI or ultrasound may provide better characterization. This findings is similar to prior CT. There is no hydronephrosis on the left. No stone identified. The visualized ureter appears unremarkable. The urinary bladder is collapsed. Stomach/Bowel: There is sigmoid diverticulosis without active inflammatory changes. Scattered colonic diverticula in the proximal transverse as well as in the ascending colon noted. Mild haziness of the diverticula at the hepatic flexure, likely secondary to extension of inflammatory changes of the cholecystectomy. Diverticulitis is less likely. There is no evidence of bowel obstruction. Normal appendix. Vascular/Lymphatic: Mild aortoiliac atherosclerotic disease. There is no aneurysmal dilatation or evidence of dissection. The origins of the celiac axis, SMA, IMA appear patent. The IVC appears patent. No portal venous gas identified. The SMV, splenic vein, and main portal vein are patent. There is no adenopathy. Reproductive: The prostate and seminal vesicles are grossly unremarkable. Other: Small fat containing umbilical hernia. Right anterior abdominal wall hernia repair mesh. Musculoskeletal: Degenerative changes of the spine. No acute fracture.  Review of the MIP images confirms the above findings. IMPRESSION: No CT evidence of aortic dissection or central pulmonary artery embolus. Inflammatory changes of the cholecystectomy with small amount of fluid in the subhepatic area concerning for an infectious process or bile leak. Correlation with clinical exam recommended. A hepatobiliary scintigraphy may provide  better evaluation and assistant for possible chronic. No drainable fluid collection. Colonic diverticulosis without active inflammatory changes. No evidence of bowel obstruction. Normal appendix. Electronically Signed   By: Anner Crete M.D.   On: 03/15/2016 03:35    CARDIAC CATHETERIZATION  -Cardiac Catheterization showed  Prox LAD lesion, 25 % stenosed.  There is moderate left ventricular systolic dysfunction.  LV end diastolic pressure is normal.  The left ventricular ejection fraction is 25-35% by visual estimate.  There is trivial (1+) mitral regurgitation.  -Continue IV heparin for now.  Subjective: Seen and examined at bedside and doing well. No complaints. Ready to go home.   Discharge Exam: Vitals:   03/21/16 0513 03/21/16 0903  BP: 112/70 107/70  Pulse: 77 78  Resp: 18 18  Temp: 98.1 F (36.7 C) 98.6 F (37 C)   Vitals:   03/20/16 1724 03/20/16 2119 03/21/16 0513 03/21/16 0903  BP: 119/81 114/61 112/70 107/70  Pulse: 67 70 77 78  Resp: 18 18 18 18   Temp: 97.3 F (36.3 C) 98 F (36.7 C) 98.1 F (36.7 C) 98.6 F (37 C)  TempSrc: Oral Oral Oral Oral  SpO2: 100% 100% 98% 99%  Weight:  108.8 kg (239 lb 13.8 oz)    Height:        General: Pt is alert, awake, not in acute distress Cardiovascular: RRR, S1/S2 +, no rubs, no gallops Respiratory: CTA bilaterally, no wheezing, no rhonchi Abdominal: Soft, NT, ND, bowel sounds + Extremities: no edema, no cyanosis; Left Arm AVF   The results of significant diagnostics from this hospitalization (including imaging, microbiology, ancillary and laboratory) are listed below for reference.    Microbiology: Recent Results (from the past 240 hour(s))  MRSA PCR Screening     Status: None   Collection Time: 03/16/16  2:50 AM  Result Value Ref Range Status   MRSA by PCR NEGATIVE NEGATIVE Final    Comment:        The GeneXpert MRSA Assay (FDA approved for NASAL specimens only), is one component of a comprehensive  MRSA colonization surveillance program. It is not intended to diagnose MRSA infection nor to guide or monitor treatment for MRSA infections.     Labs: BNP (last 3 results) No results for input(s): BNP in the last 8760 hours. Basic Metabolic Panel:  Recent Labs Lab 03/15/16 1606 03/16/16 1228 03/17/16 0659 03/18/16 0340 03/19/16 0443 03/20/16 0347 03/21/16 0613  NA 133* 134* 133* 132* 132* 131* 132*  K 6.1* 4.9 5.2* 4.9 5.2* 5.3* 5.3*  CL 99* 98* 96* 93* 93* 93* 94*  CO2 24 28 24 27 27 24 22   GLUCOSE 102* 90 96 87 92 82 73  BUN 60* 32* 43* 32* 46* 27* 39*  CREATININE 15.23* 10.49* 12.14* 9.28* 11.96* 8.98* 11.80*  CALCIUM 7.8* 8.4* 8.3* 8.9 8.5* 8.4* 8.7*  MG  --   --   --  2.2  --  2.1  --   PHOS 4.1 4.9* 5.7* 4.3  --  3.7  --    Liver Function Tests:  Recent Labs Lab 03/14/16 2214  03/17/16 0659 03/18/16 0340 03/19/16 0443 03/20/16 0347 03/21/16 0613  AST 13*  --   --  14* 21 22 22   ALT 12*  --   --  8* 24 30 28   ALKPHOS 101  --   --  128* 142* 152* 161*  BILITOT 0.9  --   --  0.7 0.6 0.5 0.4  PROT 7.3  --   --  8.0 7.5 8.4* 7.7  ALBUMIN 3.4*  < > 3.2* 3.5 3.4* 3.7 3.3*  < > = values in this interval not displayed.  Recent Labs Lab 03/14/16 2214 03/16/16 1228  LIPASE 125* 35   No results for input(s): AMMONIA in the last 168 hours. CBC:  Recent Labs Lab 03/14/16 2214  03/17/16 0659 03/18/16 0340 03/19/16 0443 03/20/16 0347 03/21/16 0613  WBC 4.7  < > 4.1 5.5 4.9 7.1 8.0  NEUTROABS 2.9  --   --  3.4  --   --   --   HGB 11.3*  < > 10.9* 11.9* 11.4* 12.4* 12.1*  HCT 34.6*  < > 33.7* 36.3* 35.0* 37.5* 38.0*  MCV 96.4  < > 96.3 96.0 95.9 96.6 97.9  PLT 144*  < > 139* 134* 142* 148* 134*  < > = values in this interval not displayed. Cardiac Enzymes:  Recent Labs Lab 03/14/16 2214 03/15/16 0119 03/15/16 0437 03/15/16 1035 03/15/16 1607  TROPONINI 0.22* 0.21* 0.22* 0.24* 0.21*   BNP: Invalid input(s): POCBNP CBG: No results for  input(s): GLUCAP in the last 168 hours. D-Dimer No results for input(s): DDIMER in the last 72 hours. Hgb A1c No results for input(s): HGBA1C in the last 72 hours. Lipid Profile No results for input(s): CHOL, HDL, LDLCALC, TRIG, CHOLHDL, LDLDIRECT in the last 72 hours. Thyroid function studies No results for input(s): TSH, T4TOTAL, T3FREE, THYROIDAB in the last 72 hours.  Invalid input(s): FREET3 Anemia work up No results for input(s): VITAMINB12, FOLATE, FERRITIN, TIBC, IRON, RETICCTPCT in the last 72 hours. Urinalysis    Component Value Date/Time   COLORURINE YELLOW 05/19/2011 1042   APPEARANCEUR CLEAR 05/19/2011 1042   LABSPEC 1.013 05/19/2011 1042   PHURINE 6.5 05/19/2011 1042   GLUCOSEU NEGATIVE 05/19/2011 1042   HGBUR TRACE (A) 05/19/2011 1042   BILIRUBINUR NEGATIVE 05/19/2011 1042   KETONESUR NEGATIVE 05/19/2011 1042   PROTEINUR >300 (A) 05/19/2011 1042   UROBILINOGEN 0.2 05/19/2011 1042   NITRITE NEGATIVE 05/19/2011 1042   LEUKOCYTESUR NEGATIVE 05/19/2011 1042   Sepsis Labs Invalid input(s): PROCALCITONIN,  WBC,  LACTICIDVEN Microbiology Recent Results (from the past 240 hour(s))  MRSA PCR Screening     Status: None   Collection Time: 03/16/16  2:50 AM  Result Value Ref Range Status   MRSA by PCR NEGATIVE NEGATIVE Final    Comment:        The GeneXpert MRSA Assay (FDA approved for NASAL specimens only), is one component of a comprehensive MRSA colonization surveillance program. It is not intended to diagnose MRSA infection nor to guide or monitor treatment for MRSA infections.    Time coordinating discharge: Over 30 minutes  SIGNED:  Kerney Elbe, DO [facility] 03/21/2016, 10:55 AM Pager [phone]  If 7PM-7AM, please contact night-coverage [email or web address] Password [email or web address]

## 2016-03-21 NOTE — Progress Notes (Signed)
Subjective:  Doing well denies any chest pain or shortness of breath. INR in therapeutic range   Objective:  Vital Signs in the last 24 hours: Temp:  [97.3 F (36.3 C)-98.6 F (37 C)] 98.6 F (37 C) (10/29 0903) Pulse Rate:  [67-84] 78 (10/29 0903) Resp:  [18] 18 (10/29 0903) BP: (90-119)/(61-81) 107/70 (10/29 0903) SpO2:  [98 %-100 %] 99 % (10/29 0903) Weight:  [237 lb 10.5 oz (107.8 kg)-239 lb 13.8 oz (108.8 kg)] 239 lb 13.8 oz (108.8 kg) (10/28 2119)  Intake/Output from previous day: 10/28 0701 - 10/29 0700 In: 740 [P.O.:240; I.V.:260; NG/GT:240] Out: 1 [Stool:1] Intake/Output from this shift: Total I/O In: 240 [P.O.:240] Out: 0   Physical Exam: Exam unchanged  Lab Results:  Recent Labs  03/20/16 0347 03/21/16 0613  WBC 7.1 8.0  HGB 12.4* 12.1*  PLT 148* 134*    Recent Labs  03/20/16 0347 03/21/16 0613  NA 131* 132*  K 5.3* 5.3*  CL 93* 94*  CO2 24 22  GLUCOSE 82 73  BUN 27* 39*  CREATININE 8.98* 11.80*   No results for input(s): TROPONINI in the last 72 hours.  Invalid input(s): CK, MB Hepatic Function Panel  Recent Labs  03/21/16 0613  PROT 7.7  ALBUMIN 3.3*  AST 22  ALT 28  ALKPHOS 161*  BILITOT 0.4   No results for input(s): CHOL in the last 72 hours. No results for input(s): PROTIME in the last 72 hours.  Imaging: Imaging results have been reviewed and No results found.  Cardiac Studies:  Assessment/Plan:  Nonobstructive coronary artery disease status post left cardiac catheterization Nonischemic Dilated cardiomyopathy S/P GB surgery Obesity Anemia od chronic disease ESRD DM, II Paroxysmal Atrial flutter, CHA2DSVASc score of 5 Plan Continue present management Okay to discharge from cardiac point of view Check PT/INR in few days follow-up with  Dr. Doylene Canard in one week  LOS: 6 days    Charolette Forward 03/21/2016, 11:02 AM

## 2016-03-21 NOTE — Progress Notes (Signed)
[facility] Progress Note  Dialysis Orders:  [facility] MWF  4h 180 F 2k/2.25Ca 450/800 EDW 109kg Hectorol 82mcg IV q week  Heparin 7000 U IV q treatment OP Labs: Hgb 11.6 Ca 9.7 P 4.6 iPTH 207  Assessment/Plan: 1. Nonischemic dilated cardiomyopathy - per primary LVEF 20-25% should improve with good vol control 2. P Aflutter - on coumadin subtherapeutic on admission -  INR 2.08 today  3. ESRD - cont MWF - will place orders in case still here in am  4. Anemia - Hgb 12.1 off esa 5. Secondary hyperparathyroidism - Ca 8.7/P 4.3 - cont Hectorol/Sensipar  6. HTN/volume - BP controlled on carvedilol/isosorbide/ plan to lower OP EDW at d/c post HD weight 10/27 107kg  Lower dose meds 7. Nutrition - Alb 3.3 vitamins/protein supp 8. Obesity   Ogechi Larina Earthly PA-C [facility] Pager [phone] 03/21/2016,9:21 AM  LOS: 6 days  I have seen and examined this patient and agree with the plan of care seen,eval, examined. Went over bp meds.   .  Jaren Kearn L 03/21/2016, 9:51 AM   Subjective: Tired but no c/os. Abdominal pain resolved. Likely d/c with INR in range   Objective Vitals:   03/20/16 1724 03/20/16 2119 03/21/16 0513 03/21/16 0903  BP: 119/81 114/61 112/70 107/70  Pulse: 67 70 77 78  Resp: 18 18 18 18   Temp: 97.3 F (36.3 C) 98 F (36.7 C) 98.1 F (36.7 C) 98.6 F (37 C)  TempSrc: Oral Oral Oral Oral  SpO2: 100% 100% 98% 99%  Weight:  108.8 kg (239 lb 13.8 oz)    Height:       Physical Exam General: WNWD AAM NAD Heart: RRR  Lungs: Breathing unlabored CTAB Abdomen: obese Bs+ NT/ND Extremities: no LE edema  Dialysis Access: LUE AVF +thrill  Additional Objective Labs: Basic Metabolic Panel:  Recent Labs Lab 03/17/16 0659 03/18/16 0340 03/19/16 0443 03/20/16 0347 03/21/16 0613  NA 133* 132* 132* 131* 132*  K 5.2* 4.9 5.2* 5.3* 5.3*  CL 96* 93* 93* 93* 94*  CO2 24 27 27 24 22   GLUCOSE 96 87 92 82 73  BUN 43* 32* 46* 27* 39*   CREATININE 12.14* 9.28* 11.96* 8.98* 11.80*  CALCIUM 8.3* 8.9 8.5* 8.4* 8.7*  PHOS 5.7* 4.3  --  3.7  --    Liver Function Tests:  Recent Labs Lab 03/19/16 0443 03/20/16 0347 03/21/16 0613  AST 21 22 22   ALT 24 30 28   ALKPHOS 142* 152* 161*  BILITOT 0.6 0.5 0.4  PROT 7.5 8.4* 7.7  ALBUMIN 3.4* 3.7 3.3*    Recent Labs Lab 03/14/16 2214 03/16/16 1228  LIPASE 125* 35   CBC:  Recent Labs Lab 03/14/16 2214  03/17/16 0659 03/18/16 0340 03/19/16 0443 03/20/16 0347 03/21/16 0613  WBC 4.7  < > 4.1 5.5 4.9 7.1 8.0  NEUTROABS 2.9  --   --  3.4  --   --   --   HGB 11.3*  < > 10.9* 11.9* 11.4* 12.4* 12.1*  HCT 34.6*  < > 33.7* 36.3* 35.0* 37.5* 38.0*  MCV 96.4  < > 96.3 96.0 95.9 96.6 97.9  PLT 144*  < > 139* 134* 142* 148* 134*  < > = values in this interval not displayed. Blood Culture    Component Value Date/Time   SDES URINE, RANDOM 01/18/2011 0758   SPECREQUEST NONE 01/18/2011 0758   CULT NO GROWTH 01/18/2011 0758   REPTSTATUS 01/19/2011 FINAL 01/18/2011 0758  Cardiac Enzymes:  Recent Labs Lab 03/14/16 2214 03/15/16 0119 03/15/16 0437 03/15/16 1035 03/15/16 1607  TROPONINI 0.22* 0.21* 0.22* 0.24* 0.21*   CBG: No results for input(s): GLUCAP in the last 168 hours. Iron Studies: No results for input(s): IRON, TIBC, TRANSFERRIN, FERRITIN in the last 72 hours. Lab Results  Component Value Date   INR 2.08 03/21/2016   INR 1.49 03/20/2016   INR 1.15 03/19/2016   Medications: . heparin 1,300 Units/hr (03/20/16 1958)   . allopurinol  150 mg Oral Daily  . amiodarone  100 mg Oral Daily  . carvedilol  3.125 mg Oral 2 times per day on Sun Tue Thu Sat  . cinacalcet  90 mg Oral Q breakfast  . doxercalciferol  1 mcg Intravenous Q M,W,F-HD  . feeding supplement (NEPRO CARB STEADY)  237 mL Oral BID BM  . isosorbide dinitrate  20 mg Oral Daily  . multivitamin  1 tablet Oral QHS  . pantoprazole  40 mg Oral BID  . sevelamer carbonate  2,400 mg Oral TID WC   . sodium chloride flush  3 mL Intravenous Q12H  . sodium chloride flush  3 mL Intravenous Q12H  . Warfarin - Physician Dosing Inpatient   Does not apply [phone]

## 2016-03-22 DIAGNOSIS — E1129 Type 2 diabetes mellitus with other diabetic kidney complication: Secondary | ICD-10-CM | POA: Diagnosis not present

## 2016-03-22 DIAGNOSIS — I4892 Unspecified atrial flutter: Secondary | ICD-10-CM | POA: Diagnosis not present

## 2016-03-23 DIAGNOSIS — I120 Hypertensive chronic kidney disease with stage 5 chronic kidney disease or end stage renal disease: Secondary | ICD-10-CM | POA: Diagnosis not present

## 2016-03-23 DIAGNOSIS — Z992 Dependence on renal dialysis: Secondary | ICD-10-CM | POA: Diagnosis not present

## 2016-03-23 DIAGNOSIS — I2510 Atherosclerotic heart disease of native coronary artery without angina pectoris: Secondary | ICD-10-CM | POA: Diagnosis not present

## 2016-03-23 DIAGNOSIS — E1122 Type 2 diabetes mellitus with diabetic chronic kidney disease: Secondary | ICD-10-CM | POA: Diagnosis not present

## 2016-03-23 DIAGNOSIS — N186 End stage renal disease: Secondary | ICD-10-CM | POA: Diagnosis not present

## 2016-03-24 DIAGNOSIS — D631 Anemia in chronic kidney disease: Secondary | ICD-10-CM | POA: Diagnosis not present

## 2016-03-24 DIAGNOSIS — N186 End stage renal disease: Secondary | ICD-10-CM | POA: Diagnosis not present

## 2016-03-24 DIAGNOSIS — E1129 Type 2 diabetes mellitus with other diabetic kidney complication: Secondary | ICD-10-CM | POA: Diagnosis not present

## 2016-03-24 DIAGNOSIS — N2581 Secondary hyperparathyroidism of renal origin: Secondary | ICD-10-CM | POA: Diagnosis not present

## 2016-03-26 DIAGNOSIS — E1129 Type 2 diabetes mellitus with other diabetic kidney complication: Secondary | ICD-10-CM | POA: Diagnosis not present

## 2016-03-26 DIAGNOSIS — D631 Anemia in chronic kidney disease: Secondary | ICD-10-CM | POA: Diagnosis not present

## 2016-03-26 DIAGNOSIS — N2581 Secondary hyperparathyroidism of renal origin: Secondary | ICD-10-CM | POA: Diagnosis not present

## 2016-03-26 DIAGNOSIS — N186 End stage renal disease: Secondary | ICD-10-CM | POA: Diagnosis not present

## 2016-03-29 DIAGNOSIS — D631 Anemia in chronic kidney disease: Secondary | ICD-10-CM | POA: Diagnosis not present

## 2016-03-29 DIAGNOSIS — E1129 Type 2 diabetes mellitus with other diabetic kidney complication: Secondary | ICD-10-CM | POA: Diagnosis not present

## 2016-03-29 DIAGNOSIS — N2581 Secondary hyperparathyroidism of renal origin: Secondary | ICD-10-CM | POA: Diagnosis not present

## 2016-03-29 DIAGNOSIS — N186 End stage renal disease: Secondary | ICD-10-CM | POA: Diagnosis not present

## 2016-03-31 DIAGNOSIS — D631 Anemia in chronic kidney disease: Secondary | ICD-10-CM | POA: Diagnosis not present

## 2016-03-31 DIAGNOSIS — N186 End stage renal disease: Secondary | ICD-10-CM | POA: Diagnosis not present

## 2016-03-31 DIAGNOSIS — N2581 Secondary hyperparathyroidism of renal origin: Secondary | ICD-10-CM | POA: Diagnosis not present

## 2016-03-31 DIAGNOSIS — E1129 Type 2 diabetes mellitus with other diabetic kidney complication: Secondary | ICD-10-CM | POA: Diagnosis not present

## 2016-04-01 DIAGNOSIS — I5022 Chronic systolic (congestive) heart failure: Secondary | ICD-10-CM | POA: Diagnosis not present

## 2016-04-01 DIAGNOSIS — N186 End stage renal disease: Secondary | ICD-10-CM | POA: Diagnosis not present

## 2016-04-01 DIAGNOSIS — I120 Hypertensive chronic kidney disease with stage 5 chronic kidney disease or end stage renal disease: Secondary | ICD-10-CM | POA: Diagnosis not present

## 2016-04-01 DIAGNOSIS — I2510 Atherosclerotic heart disease of native coronary artery without angina pectoris: Secondary | ICD-10-CM | POA: Diagnosis not present

## 2016-04-01 DIAGNOSIS — E6609 Other obesity due to excess calories: Secondary | ICD-10-CM | POA: Diagnosis not present

## 2016-04-01 DIAGNOSIS — R42 Dizziness and giddiness: Secondary | ICD-10-CM | POA: Diagnosis not present

## 2016-04-01 DIAGNOSIS — Z7901 Long term (current) use of anticoagulants: Secondary | ICD-10-CM | POA: Diagnosis not present

## 2016-04-02 DIAGNOSIS — N186 End stage renal disease: Secondary | ICD-10-CM | POA: Diagnosis not present

## 2016-04-02 DIAGNOSIS — E1129 Type 2 diabetes mellitus with other diabetic kidney complication: Secondary | ICD-10-CM | POA: Diagnosis not present

## 2016-04-02 DIAGNOSIS — N2581 Secondary hyperparathyroidism of renal origin: Secondary | ICD-10-CM | POA: Diagnosis not present

## 2016-04-02 DIAGNOSIS — D631 Anemia in chronic kidney disease: Secondary | ICD-10-CM | POA: Diagnosis not present

## 2016-04-05 DIAGNOSIS — N186 End stage renal disease: Secondary | ICD-10-CM | POA: Diagnosis not present

## 2016-04-05 DIAGNOSIS — D631 Anemia in chronic kidney disease: Secondary | ICD-10-CM | POA: Diagnosis not present

## 2016-04-05 DIAGNOSIS — E1129 Type 2 diabetes mellitus with other diabetic kidney complication: Secondary | ICD-10-CM | POA: Diagnosis not present

## 2016-04-05 DIAGNOSIS — N2581 Secondary hyperparathyroidism of renal origin: Secondary | ICD-10-CM | POA: Diagnosis not present

## 2016-04-07 DIAGNOSIS — N186 End stage renal disease: Secondary | ICD-10-CM | POA: Diagnosis not present

## 2016-04-07 DIAGNOSIS — D631 Anemia in chronic kidney disease: Secondary | ICD-10-CM | POA: Diagnosis not present

## 2016-04-07 DIAGNOSIS — N2581 Secondary hyperparathyroidism of renal origin: Secondary | ICD-10-CM | POA: Diagnosis not present

## 2016-04-07 DIAGNOSIS — E1129 Type 2 diabetes mellitus with other diabetic kidney complication: Secondary | ICD-10-CM | POA: Diagnosis not present

## 2016-04-09 DIAGNOSIS — N2581 Secondary hyperparathyroidism of renal origin: Secondary | ICD-10-CM | POA: Diagnosis not present

## 2016-04-09 DIAGNOSIS — D631 Anemia in chronic kidney disease: Secondary | ICD-10-CM | POA: Diagnosis not present

## 2016-04-09 DIAGNOSIS — E1129 Type 2 diabetes mellitus with other diabetic kidney complication: Secondary | ICD-10-CM | POA: Diagnosis not present

## 2016-04-09 DIAGNOSIS — N186 End stage renal disease: Secondary | ICD-10-CM | POA: Diagnosis not present

## 2016-04-11 DIAGNOSIS — N2581 Secondary hyperparathyroidism of renal origin: Secondary | ICD-10-CM | POA: Diagnosis not present

## 2016-04-11 DIAGNOSIS — N186 End stage renal disease: Secondary | ICD-10-CM | POA: Diagnosis not present

## 2016-04-11 DIAGNOSIS — D631 Anemia in chronic kidney disease: Secondary | ICD-10-CM | POA: Diagnosis not present

## 2016-04-11 DIAGNOSIS — E1129 Type 2 diabetes mellitus with other diabetic kidney complication: Secondary | ICD-10-CM | POA: Diagnosis not present

## 2016-04-13 DIAGNOSIS — N186 End stage renal disease: Secondary | ICD-10-CM | POA: Diagnosis not present

## 2016-04-13 DIAGNOSIS — D631 Anemia in chronic kidney disease: Secondary | ICD-10-CM | POA: Diagnosis not present

## 2016-04-13 DIAGNOSIS — E1129 Type 2 diabetes mellitus with other diabetic kidney complication: Secondary | ICD-10-CM | POA: Diagnosis not present

## 2016-04-13 DIAGNOSIS — I4892 Unspecified atrial flutter: Secondary | ICD-10-CM | POA: Diagnosis not present

## 2016-04-13 DIAGNOSIS — N2581 Secondary hyperparathyroidism of renal origin: Secondary | ICD-10-CM | POA: Diagnosis not present

## 2016-04-16 DIAGNOSIS — D631 Anemia in chronic kidney disease: Secondary | ICD-10-CM | POA: Diagnosis not present

## 2016-04-16 DIAGNOSIS — N186 End stage renal disease: Secondary | ICD-10-CM | POA: Diagnosis not present

## 2016-04-16 DIAGNOSIS — E1129 Type 2 diabetes mellitus with other diabetic kidney complication: Secondary | ICD-10-CM | POA: Diagnosis not present

## 2016-04-16 DIAGNOSIS — N2581 Secondary hyperparathyroidism of renal origin: Secondary | ICD-10-CM | POA: Diagnosis not present

## 2016-04-19 DIAGNOSIS — N2581 Secondary hyperparathyroidism of renal origin: Secondary | ICD-10-CM | POA: Diagnosis not present

## 2016-04-19 DIAGNOSIS — E1129 Type 2 diabetes mellitus with other diabetic kidney complication: Secondary | ICD-10-CM | POA: Diagnosis not present

## 2016-04-19 DIAGNOSIS — N186 End stage renal disease: Secondary | ICD-10-CM | POA: Diagnosis not present

## 2016-04-19 DIAGNOSIS — D631 Anemia in chronic kidney disease: Secondary | ICD-10-CM | POA: Diagnosis not present

## 2016-04-21 DIAGNOSIS — N2581 Secondary hyperparathyroidism of renal origin: Secondary | ICD-10-CM | POA: Diagnosis not present

## 2016-04-21 DIAGNOSIS — E1129 Type 2 diabetes mellitus with other diabetic kidney complication: Secondary | ICD-10-CM | POA: Diagnosis not present

## 2016-04-21 DIAGNOSIS — N186 End stage renal disease: Secondary | ICD-10-CM | POA: Diagnosis not present

## 2016-04-21 DIAGNOSIS — D631 Anemia in chronic kidney disease: Secondary | ICD-10-CM | POA: Diagnosis not present

## 2016-04-22 DIAGNOSIS — N186 End stage renal disease: Secondary | ICD-10-CM | POA: Diagnosis not present

## 2016-04-22 DIAGNOSIS — I120 Hypertensive chronic kidney disease with stage 5 chronic kidney disease or end stage renal disease: Secondary | ICD-10-CM | POA: Diagnosis not present

## 2016-04-22 DIAGNOSIS — Z992 Dependence on renal dialysis: Secondary | ICD-10-CM | POA: Diagnosis not present

## 2016-04-23 DIAGNOSIS — E1129 Type 2 diabetes mellitus with other diabetic kidney complication: Secondary | ICD-10-CM | POA: Diagnosis not present

## 2016-04-23 DIAGNOSIS — E8770 Fluid overload, unspecified: Secondary | ICD-10-CM | POA: Diagnosis not present

## 2016-04-23 DIAGNOSIS — N186 End stage renal disease: Secondary | ICD-10-CM | POA: Diagnosis not present

## 2016-04-23 DIAGNOSIS — N2581 Secondary hyperparathyroidism of renal origin: Secondary | ICD-10-CM | POA: Diagnosis not present

## 2016-04-26 DIAGNOSIS — E1129 Type 2 diabetes mellitus with other diabetic kidney complication: Secondary | ICD-10-CM | POA: Diagnosis not present

## 2016-04-26 DIAGNOSIS — N2581 Secondary hyperparathyroidism of renal origin: Secondary | ICD-10-CM | POA: Diagnosis not present

## 2016-04-26 DIAGNOSIS — E8770 Fluid overload, unspecified: Secondary | ICD-10-CM | POA: Diagnosis not present

## 2016-04-26 DIAGNOSIS — N186 End stage renal disease: Secondary | ICD-10-CM | POA: Diagnosis not present

## 2016-04-28 DIAGNOSIS — N186 End stage renal disease: Secondary | ICD-10-CM | POA: Diagnosis not present

## 2016-04-28 DIAGNOSIS — E8770 Fluid overload, unspecified: Secondary | ICD-10-CM | POA: Diagnosis not present

## 2016-04-28 DIAGNOSIS — N2581 Secondary hyperparathyroidism of renal origin: Secondary | ICD-10-CM | POA: Diagnosis not present

## 2016-04-28 DIAGNOSIS — E1129 Type 2 diabetes mellitus with other diabetic kidney complication: Secondary | ICD-10-CM | POA: Diagnosis not present

## 2016-04-30 DIAGNOSIS — E1129 Type 2 diabetes mellitus with other diabetic kidney complication: Secondary | ICD-10-CM | POA: Diagnosis not present

## 2016-04-30 DIAGNOSIS — N2581 Secondary hyperparathyroidism of renal origin: Secondary | ICD-10-CM | POA: Diagnosis not present

## 2016-04-30 DIAGNOSIS — E8770 Fluid overload, unspecified: Secondary | ICD-10-CM | POA: Diagnosis not present

## 2016-04-30 DIAGNOSIS — N186 End stage renal disease: Secondary | ICD-10-CM | POA: Diagnosis not present

## 2016-05-03 DIAGNOSIS — E1129 Type 2 diabetes mellitus with other diabetic kidney complication: Secondary | ICD-10-CM | POA: Diagnosis not present

## 2016-05-03 DIAGNOSIS — E8770 Fluid overload, unspecified: Secondary | ICD-10-CM | POA: Diagnosis not present

## 2016-05-03 DIAGNOSIS — N186 End stage renal disease: Secondary | ICD-10-CM | POA: Diagnosis not present

## 2016-05-03 DIAGNOSIS — N2581 Secondary hyperparathyroidism of renal origin: Secondary | ICD-10-CM | POA: Diagnosis not present

## 2016-05-04 DIAGNOSIS — I5022 Chronic systolic (congestive) heart failure: Secondary | ICD-10-CM | POA: Diagnosis not present

## 2016-05-04 DIAGNOSIS — I2510 Atherosclerotic heart disease of native coronary artery without angina pectoris: Secondary | ICD-10-CM | POA: Diagnosis not present

## 2016-05-04 DIAGNOSIS — E6609 Other obesity due to excess calories: Secondary | ICD-10-CM | POA: Diagnosis not present

## 2016-05-04 DIAGNOSIS — N186 End stage renal disease: Secondary | ICD-10-CM | POA: Diagnosis not present

## 2016-05-04 DIAGNOSIS — I120 Hypertensive chronic kidney disease with stage 5 chronic kidney disease or end stage renal disease: Secondary | ICD-10-CM | POA: Diagnosis not present

## 2016-05-04 DIAGNOSIS — Z7901 Long term (current) use of anticoagulants: Secondary | ICD-10-CM | POA: Diagnosis not present

## 2016-05-05 DIAGNOSIS — E1129 Type 2 diabetes mellitus with other diabetic kidney complication: Secondary | ICD-10-CM | POA: Diagnosis not present

## 2016-05-05 DIAGNOSIS — N2581 Secondary hyperparathyroidism of renal origin: Secondary | ICD-10-CM | POA: Diagnosis not present

## 2016-05-05 DIAGNOSIS — N186 End stage renal disease: Secondary | ICD-10-CM | POA: Diagnosis not present

## 2016-05-05 DIAGNOSIS — E8770 Fluid overload, unspecified: Secondary | ICD-10-CM | POA: Diagnosis not present

## 2016-05-07 DIAGNOSIS — E8770 Fluid overload, unspecified: Secondary | ICD-10-CM | POA: Diagnosis not present

## 2016-05-07 DIAGNOSIS — N186 End stage renal disease: Secondary | ICD-10-CM | POA: Diagnosis not present

## 2016-05-07 DIAGNOSIS — E1129 Type 2 diabetes mellitus with other diabetic kidney complication: Secondary | ICD-10-CM | POA: Diagnosis not present

## 2016-05-07 DIAGNOSIS — N2581 Secondary hyperparathyroidism of renal origin: Secondary | ICD-10-CM | POA: Diagnosis not present

## 2016-05-10 DIAGNOSIS — N2581 Secondary hyperparathyroidism of renal origin: Secondary | ICD-10-CM | POA: Diagnosis not present

## 2016-05-10 DIAGNOSIS — E1129 Type 2 diabetes mellitus with other diabetic kidney complication: Secondary | ICD-10-CM | POA: Diagnosis not present

## 2016-05-10 DIAGNOSIS — N186 End stage renal disease: Secondary | ICD-10-CM | POA: Diagnosis not present

## 2016-05-10 DIAGNOSIS — E8770 Fluid overload, unspecified: Secondary | ICD-10-CM | POA: Diagnosis not present

## 2016-05-12 DIAGNOSIS — E1129 Type 2 diabetes mellitus with other diabetic kidney complication: Secondary | ICD-10-CM | POA: Diagnosis not present

## 2016-05-12 DIAGNOSIS — N186 End stage renal disease: Secondary | ICD-10-CM | POA: Diagnosis not present

## 2016-05-12 DIAGNOSIS — E8770 Fluid overload, unspecified: Secondary | ICD-10-CM | POA: Diagnosis not present

## 2016-05-12 DIAGNOSIS — N2581 Secondary hyperparathyroidism of renal origin: Secondary | ICD-10-CM | POA: Diagnosis not present

## 2016-05-14 DIAGNOSIS — E8770 Fluid overload, unspecified: Secondary | ICD-10-CM | POA: Diagnosis not present

## 2016-05-14 DIAGNOSIS — E1129 Type 2 diabetes mellitus with other diabetic kidney complication: Secondary | ICD-10-CM | POA: Diagnosis not present

## 2016-05-14 DIAGNOSIS — N186 End stage renal disease: Secondary | ICD-10-CM | POA: Diagnosis not present

## 2016-05-14 DIAGNOSIS — N2581 Secondary hyperparathyroidism of renal origin: Secondary | ICD-10-CM | POA: Diagnosis not present

## 2016-05-16 DIAGNOSIS — E1129 Type 2 diabetes mellitus with other diabetic kidney complication: Secondary | ICD-10-CM | POA: Diagnosis not present

## 2016-05-16 DIAGNOSIS — N186 End stage renal disease: Secondary | ICD-10-CM | POA: Diagnosis not present

## 2016-05-16 DIAGNOSIS — N2581 Secondary hyperparathyroidism of renal origin: Secondary | ICD-10-CM | POA: Diagnosis not present

## 2016-05-16 DIAGNOSIS — E8770 Fluid overload, unspecified: Secondary | ICD-10-CM | POA: Diagnosis not present

## 2016-05-19 DIAGNOSIS — I4892 Unspecified atrial flutter: Secondary | ICD-10-CM | POA: Diagnosis not present

## 2016-05-19 DIAGNOSIS — N2581 Secondary hyperparathyroidism of renal origin: Secondary | ICD-10-CM | POA: Diagnosis not present

## 2016-05-19 DIAGNOSIS — E1129 Type 2 diabetes mellitus with other diabetic kidney complication: Secondary | ICD-10-CM | POA: Diagnosis not present

## 2016-05-19 DIAGNOSIS — E8770 Fluid overload, unspecified: Secondary | ICD-10-CM | POA: Diagnosis not present

## 2016-05-19 DIAGNOSIS — N186 End stage renal disease: Secondary | ICD-10-CM | POA: Diagnosis not present

## 2016-05-21 DIAGNOSIS — N2581 Secondary hyperparathyroidism of renal origin: Secondary | ICD-10-CM | POA: Diagnosis not present

## 2016-05-21 DIAGNOSIS — E8770 Fluid overload, unspecified: Secondary | ICD-10-CM | POA: Diagnosis not present

## 2016-05-21 DIAGNOSIS — N186 End stage renal disease: Secondary | ICD-10-CM | POA: Diagnosis not present

## 2016-05-21 DIAGNOSIS — E1129 Type 2 diabetes mellitus with other diabetic kidney complication: Secondary | ICD-10-CM | POA: Diagnosis not present

## 2016-05-23 DIAGNOSIS — Z992 Dependence on renal dialysis: Secondary | ICD-10-CM | POA: Diagnosis not present

## 2016-05-23 DIAGNOSIS — E1129 Type 2 diabetes mellitus with other diabetic kidney complication: Secondary | ICD-10-CM | POA: Diagnosis not present

## 2016-05-23 DIAGNOSIS — N186 End stage renal disease: Secondary | ICD-10-CM | POA: Diagnosis not present

## 2016-05-23 DIAGNOSIS — E8770 Fluid overload, unspecified: Secondary | ICD-10-CM | POA: Diagnosis not present

## 2016-05-23 DIAGNOSIS — I120 Hypertensive chronic kidney disease with stage 5 chronic kidney disease or end stage renal disease: Secondary | ICD-10-CM | POA: Diagnosis not present

## 2016-05-23 DIAGNOSIS — N2581 Secondary hyperparathyroidism of renal origin: Secondary | ICD-10-CM | POA: Diagnosis not present

## 2016-05-25 DIAGNOSIS — I10 Essential (primary) hypertension: Secondary | ICD-10-CM | POA: Diagnosis not present

## 2016-05-25 DIAGNOSIS — N2581 Secondary hyperparathyroidism of renal origin: Secondary | ICD-10-CM | POA: Diagnosis not present

## 2016-05-25 DIAGNOSIS — N186 End stage renal disease: Secondary | ICD-10-CM | POA: Diagnosis not present

## 2016-05-25 DIAGNOSIS — D631 Anemia in chronic kidney disease: Secondary | ICD-10-CM | POA: Diagnosis not present

## 2016-05-25 DIAGNOSIS — D509 Iron deficiency anemia, unspecified: Secondary | ICD-10-CM | POA: Diagnosis not present

## 2016-05-26 DIAGNOSIS — D631 Anemia in chronic kidney disease: Secondary | ICD-10-CM | POA: Diagnosis not present

## 2016-05-26 DIAGNOSIS — I10 Essential (primary) hypertension: Secondary | ICD-10-CM | POA: Diagnosis not present

## 2016-05-26 DIAGNOSIS — N186 End stage renal disease: Secondary | ICD-10-CM | POA: Diagnosis not present

## 2016-05-26 DIAGNOSIS — N2581 Secondary hyperparathyroidism of renal origin: Secondary | ICD-10-CM | POA: Diagnosis not present

## 2016-05-26 DIAGNOSIS — E8770 Fluid overload, unspecified: Secondary | ICD-10-CM | POA: Insufficient documentation

## 2016-05-26 DIAGNOSIS — D509 Iron deficiency anemia, unspecified: Secondary | ICD-10-CM | POA: Diagnosis not present

## 2016-05-28 DIAGNOSIS — N2581 Secondary hyperparathyroidism of renal origin: Secondary | ICD-10-CM | POA: Diagnosis not present

## 2016-05-28 DIAGNOSIS — I10 Essential (primary) hypertension: Secondary | ICD-10-CM | POA: Diagnosis not present

## 2016-05-28 DIAGNOSIS — N186 End stage renal disease: Secondary | ICD-10-CM | POA: Diagnosis not present

## 2016-05-28 DIAGNOSIS — D631 Anemia in chronic kidney disease: Secondary | ICD-10-CM | POA: Diagnosis not present

## 2016-05-28 DIAGNOSIS — D509 Iron deficiency anemia, unspecified: Secondary | ICD-10-CM | POA: Diagnosis not present

## 2016-05-31 DIAGNOSIS — D631 Anemia in chronic kidney disease: Secondary | ICD-10-CM | POA: Diagnosis not present

## 2016-05-31 DIAGNOSIS — D509 Iron deficiency anemia, unspecified: Secondary | ICD-10-CM | POA: Diagnosis not present

## 2016-05-31 DIAGNOSIS — N186 End stage renal disease: Secondary | ICD-10-CM | POA: Diagnosis not present

## 2016-05-31 DIAGNOSIS — I10 Essential (primary) hypertension: Secondary | ICD-10-CM | POA: Diagnosis not present

## 2016-05-31 DIAGNOSIS — N2581 Secondary hyperparathyroidism of renal origin: Secondary | ICD-10-CM | POA: Diagnosis not present

## 2016-06-02 DIAGNOSIS — D631 Anemia in chronic kidney disease: Secondary | ICD-10-CM | POA: Diagnosis not present

## 2016-06-02 DIAGNOSIS — N2581 Secondary hyperparathyroidism of renal origin: Secondary | ICD-10-CM | POA: Diagnosis not present

## 2016-06-02 DIAGNOSIS — I10 Essential (primary) hypertension: Secondary | ICD-10-CM | POA: Diagnosis not present

## 2016-06-02 DIAGNOSIS — N186 End stage renal disease: Secondary | ICD-10-CM | POA: Diagnosis not present

## 2016-06-02 DIAGNOSIS — D509 Iron deficiency anemia, unspecified: Secondary | ICD-10-CM | POA: Diagnosis not present

## 2016-06-03 DIAGNOSIS — N186 End stage renal disease: Secondary | ICD-10-CM | POA: Diagnosis not present

## 2016-06-03 DIAGNOSIS — I5022 Chronic systolic (congestive) heart failure: Secondary | ICD-10-CM | POA: Diagnosis not present

## 2016-06-03 DIAGNOSIS — I2510 Atherosclerotic heart disease of native coronary artery without angina pectoris: Secondary | ICD-10-CM | POA: Diagnosis not present

## 2016-06-03 DIAGNOSIS — I120 Hypertensive chronic kidney disease with stage 5 chronic kidney disease or end stage renal disease: Secondary | ICD-10-CM | POA: Diagnosis not present

## 2016-06-03 DIAGNOSIS — E6609 Other obesity due to excess calories: Secondary | ICD-10-CM | POA: Diagnosis not present

## 2016-06-04 DIAGNOSIS — N2581 Secondary hyperparathyroidism of renal origin: Secondary | ICD-10-CM | POA: Diagnosis not present

## 2016-06-04 DIAGNOSIS — D509 Iron deficiency anemia, unspecified: Secondary | ICD-10-CM | POA: Diagnosis not present

## 2016-06-04 DIAGNOSIS — I340 Nonrheumatic mitral (valve) insufficiency: Secondary | ICD-10-CM | POA: Diagnosis not present

## 2016-06-04 DIAGNOSIS — I2510 Atherosclerotic heart disease of native coronary artery without angina pectoris: Secondary | ICD-10-CM | POA: Diagnosis not present

## 2016-06-04 DIAGNOSIS — I10 Essential (primary) hypertension: Secondary | ICD-10-CM | POA: Diagnosis not present

## 2016-06-04 DIAGNOSIS — I361 Nonrheumatic tricuspid (valve) insufficiency: Secondary | ICD-10-CM | POA: Diagnosis not present

## 2016-06-04 DIAGNOSIS — I420 Dilated cardiomyopathy: Secondary | ICD-10-CM | POA: Diagnosis not present

## 2016-06-04 DIAGNOSIS — N186 End stage renal disease: Secondary | ICD-10-CM | POA: Diagnosis not present

## 2016-06-04 DIAGNOSIS — I5022 Chronic systolic (congestive) heart failure: Secondary | ICD-10-CM | POA: Diagnosis not present

## 2016-06-04 DIAGNOSIS — D631 Anemia in chronic kidney disease: Secondary | ICD-10-CM | POA: Diagnosis not present

## 2016-06-07 DIAGNOSIS — I10 Essential (primary) hypertension: Secondary | ICD-10-CM | POA: Diagnosis not present

## 2016-06-07 DIAGNOSIS — N2581 Secondary hyperparathyroidism of renal origin: Secondary | ICD-10-CM | POA: Diagnosis not present

## 2016-06-07 DIAGNOSIS — N186 End stage renal disease: Secondary | ICD-10-CM | POA: Diagnosis not present

## 2016-06-07 DIAGNOSIS — D631 Anemia in chronic kidney disease: Secondary | ICD-10-CM | POA: Diagnosis not present

## 2016-06-07 DIAGNOSIS — D509 Iron deficiency anemia, unspecified: Secondary | ICD-10-CM | POA: Diagnosis not present

## 2016-06-09 DIAGNOSIS — N2581 Secondary hyperparathyroidism of renal origin: Secondary | ICD-10-CM | POA: Diagnosis not present

## 2016-06-09 DIAGNOSIS — D631 Anemia in chronic kidney disease: Secondary | ICD-10-CM | POA: Diagnosis not present

## 2016-06-09 DIAGNOSIS — I10 Essential (primary) hypertension: Secondary | ICD-10-CM | POA: Diagnosis not present

## 2016-06-09 DIAGNOSIS — N186 End stage renal disease: Secondary | ICD-10-CM | POA: Diagnosis not present

## 2016-06-09 DIAGNOSIS — D509 Iron deficiency anemia, unspecified: Secondary | ICD-10-CM | POA: Diagnosis not present

## 2016-06-10 ENCOUNTER — Encounter (HOSPITAL_COMMUNITY): Payer: Self-pay

## 2016-06-10 ENCOUNTER — Emergency Department (HOSPITAL_COMMUNITY)
Admission: EM | Admit: 2016-06-10 | Discharge: 2016-06-11 | Disposition: A | Discharge status: Home / Self Care | Payer: Medicare Other | Attending: Emergency Medicine | Admitting: Emergency Medicine

## 2016-06-10 ENCOUNTER — Emergency Department (HOSPITAL_COMMUNITY): Payer: Medicare Other

## 2016-06-10 DIAGNOSIS — N186 End stage renal disease: Secondary | ICD-10-CM | POA: Diagnosis not present

## 2016-06-10 DIAGNOSIS — I5022 Chronic systolic (congestive) heart failure: Secondary | ICD-10-CM | POA: Diagnosis not present

## 2016-06-10 DIAGNOSIS — Z8673 Personal history of transient ischemic attack (TIA), and cerebral infarction without residual deficits: Secondary | ICD-10-CM | POA: Diagnosis not present

## 2016-06-10 DIAGNOSIS — Z7901 Long term (current) use of anticoagulants: Secondary | ICD-10-CM | POA: Insufficient documentation

## 2016-06-10 DIAGNOSIS — W009XXA Unspecified fall due to ice and snow, initial encounter: Secondary | ICD-10-CM | POA: Diagnosis not present

## 2016-06-10 DIAGNOSIS — I252 Old myocardial infarction: Secondary | ICD-10-CM | POA: Insufficient documentation

## 2016-06-10 DIAGNOSIS — Y939 Activity, unspecified: Secondary | ICD-10-CM | POA: Diagnosis not present

## 2016-06-10 DIAGNOSIS — M25571 Pain in right ankle and joints of right foot: Secondary | ICD-10-CM | POA: Diagnosis not present

## 2016-06-10 DIAGNOSIS — S93421A Sprain of deltoid ligament of right ankle, initial encounter: Secondary | ICD-10-CM | POA: Diagnosis not present

## 2016-06-10 DIAGNOSIS — I2510 Atherosclerotic heart disease of native coronary artery without angina pectoris: Secondary | ICD-10-CM | POA: Diagnosis not present

## 2016-06-10 DIAGNOSIS — E1122 Type 2 diabetes mellitus with diabetic chronic kidney disease: Secondary | ICD-10-CM | POA: Diagnosis not present

## 2016-06-10 DIAGNOSIS — S99921A Unspecified injury of right foot, initial encounter: Secondary | ICD-10-CM | POA: Diagnosis not present

## 2016-06-10 DIAGNOSIS — S99911A Unspecified injury of right ankle, initial encounter: Secondary | ICD-10-CM | POA: Diagnosis present

## 2016-06-10 DIAGNOSIS — Z992 Dependence on renal dialysis: Secondary | ICD-10-CM | POA: Diagnosis not present

## 2016-06-10 DIAGNOSIS — I132 Hypertensive heart and chronic kidney disease with heart failure and with stage 5 chronic kidney disease, or end stage renal disease: Secondary | ICD-10-CM | POA: Insufficient documentation

## 2016-06-10 DIAGNOSIS — Y999 Unspecified external cause status: Secondary | ICD-10-CM | POA: Insufficient documentation

## 2016-06-10 DIAGNOSIS — Y929 Unspecified place or not applicable: Secondary | ICD-10-CM | POA: Insufficient documentation

## 2016-06-10 MED ORDER — HYDROMORPHONE HCL 2 MG/ML IJ SOLN
1.0000 mg | Freq: Once | INTRAMUSCULAR | Status: AC
  Administered 2016-06-11: 1 mg via INTRAMUSCULAR
  Filled 2016-06-10: qty 1

## 2016-06-10 MED ORDER — OXYCODONE-ACETAMINOPHEN 5-325 MG PO TABS
ORAL_TABLET | ORAL | Status: AC
  Filled 2016-06-10: qty 1

## 2016-06-10 MED ORDER — ONDANSETRON 4 MG PO TBDP
8.0000 mg | ORAL_TABLET | Freq: Once | ORAL | Status: AC
  Administered 2016-06-11: 8 mg via ORAL
  Filled 2016-06-10: qty 2

## 2016-06-10 MED ORDER — OXYCODONE-ACETAMINOPHEN 5-325 MG PO TABS
1.0000 | ORAL_TABLET | ORAL | Status: DC | PRN
  Administered 2016-06-10: 1 via ORAL
  Filled 2016-06-10: qty 1

## 2016-06-10 NOTE — ED Provider Notes (Signed)
[facility] DEPT Provider Note   CSN: [id number] Arrival date & time: 06/10/16  2152  By signing my name below, I, [name], attest that this documentation has been prepared under the direction and in the presence of Orpah Greek, MD  Electronically Signed: Delton Stephenson, ED Scribe. 06/10/16. 11:48 PM.   History   Chief Complaint Chief Complaint  Patient presents with  . Ankle Pain   The history is provided by the patient. No language interpreter was used.   HPI Comments:  [name] is a 56 y.o. male who presents to the Emergency Department complaining of right ankle and medial right foot pain s/p a fall which occurred today. Pt states he slipped on ice, fell of a curb and twisted his ankle causing the injury. His pain is worse upon palpation. No alleviating factors noted. Pt denies a head injury, right hip pain and right knee pain. No other associated symptoms noted.   Past Medical History:  Diagnosis Date  . Atrial flutter ([place])   . 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 Cardiomyopathy   . CHF (congestive heart failure) ([place])   . Coronary artery disease    normal coronaries by 10/10/08 cath, Cardiac Cath 08-04-12 epic.Dr. Doylene Canard follows  . Diabetes mellitus    pt. states he's borderline diabetic., no longer taking med,- off med. since 2013  . Dialysis patient [facility])    Mon-Wed-Fri([place])- Left AV fistula  . Diverticulitis November 2016   reoccurred in December 2016  . ESRD (end stage renal disease) ([place])   . GERD (gastroesophageal reflux disease)   . Gout   . History of nephrectomy 07/04/2012   History of right nephrectomy in 2002 for renal cell carcinoma   . History of unilateral nephrectomy   . Hypertension   . Low iron   . Myocardial infarction ?2006  . Renal cell carcinoma    dialysis- MWF- Dr. Mercy Moore follows.  . Renal insufficiency   . Shortness of breath 05/19/11   "at rest, lying down, w/exertion"  .  Stroke [facility]) 02/2011   05/19/11 denies residual  . Umbilical hernia 43/15/40   unrepaired    Patient Active Problem List   Diagnosis Date Noted  . Bile leak, postoperative 03/15/2016  . Elevated troponin 03/15/2016  . Rib pain on right side   . Biliary dyskinesia 02/12/2016  . Atrial flutter with rapid ventricular response ([place]) 06/27/2015  . Diverticulitis 04/21/2015  . Acute diverticulitis 04/21/2015  . Diverticulitis of intestine without perforation or abscess without bleeding 04/21/2015  . Pulmonary edema 01/13/2015  . Right upper quadrant abdominal pain 01/13/2015  . Acute respiratory failure with hypoxia ([place]) 01/13/2015  . Diverticulitis large intestine w/o perforation or abscess w/o bleeding 12/18/2014  . Hepatic cyst 12/18/2014  . Aftercare following surgery of the circulatory system, [place] 06/22/2013  . ESRD (end stage renal disease) on dialysis ([place]) 11/28/2012  . Right sided weakness 10/05/2012  . History of nephrectomy 07/04/2012  . End-stage renal disease on hemodialysis ([place]) 06/04/2011  . Systolic CHF, chronic ([place]) 05/19/2011    Class: Acute  . Hypertension, benign 05/19/2011    Class: Chronic  . DM II (diabetes mellitus, type II), controlled ([place]) 05/19/2011    Class: Chronic  . Gout, chronic 05/19/2011    Past Surgical History:  Procedure Laterality Date  . AV FISTULA PLACEMENT  03/29/2011   Procedure: ARTERIOVENOUS (AV) FISTULA CREATION;  Surgeon: Hinda Lenis, MD;  Location:  [facility] OR;  Service: Vascular;  Laterality: Left;  LEFT RADIOCEPHALIC , Arteriovenous ALPFXTK(24097)  . CARDIAC CATHETERIZATION  05/21/11  . CARDIAC CATHETERIZATION N/A 03/16/2016   Procedure: Left Heart Cath and Coronary Angiography;  Surgeon: Dixie Dials, MD;  Location: [facility] CV LAB;  Service: Cardiovascular;  Laterality: N/A;  . CHOLECYSTECTOMY N/A 02/12/2016   Procedure: LAPAROSCOPIC CHOLECYSTECTOMY WITH INTRAOPERATIVE CHOLANGIOGRAM;  Surgeon: Jackolyn Confer, MD;   Location: [facility];  Service: General;  Laterality: N/A;  . dialysis cath placed    . ESOPHAGOGASTRODUODENOSCOPY (EGD) WITH PROPOFOL N/A 12/02/2015   Procedure: ESOPHAGOGASTRODUODENOSCOPY (EGD) WITH PROPOFOL;  Surgeon: Juanita Craver, MD;  Location: [facility] ENDOSCOPY;  Service: Endoscopy;  Laterality: N/A;  . FINGER SURGERY     L pinkie finger- ORIF- /w remaining hardware - 1990's    . HERNIA REPAIR N/A 3/53/2992   Umbilical hernia repair  . INSERTION OF DIALYSIS CATHETER N/A 01/27/2016   Procedure: INSERTION OF DIALYSIS CATHETER;  Surgeon: Waynetta Sandy, MD;  Location: [facility];  Service: Vascular;  Laterality: N/A;  . INSERTION OF MESH N/A 07/04/2012   Procedure: INSERTION OF MESH;  Surgeon: Madilyn Hook, DO;  Location: [facility];  Service: General;  Laterality: N/A;  . LAPAROSCOPIC LYSIS OF ADHESIONS N/A 02/12/2016   Procedure: LAPAROSCOPIC LYSIS OF ADHESIONS;  Surgeon: Jackolyn Confer, MD;  Location: [facility];  Service: General;  Laterality: N/A;  . LEFT AND RIGHT HEART CATHETERIZATION WITH CORONARY ANGIOGRAM N/A 08/04/2012   Procedure: LEFT AND RIGHT HEART CATHETERIZATION WITH CORONARY ANGIOGRAM;  Surgeon: Birdie Riddle, MD;  Location: [facility] CATH LAB;  Service: Cardiovascular;  Laterality: N/A;  . NEPHRECTOMY  2000   right  . REVISON OF ARTERIOVENOUS FISTULA Left 06/05/2013   Procedure: REVISON OF LEFT RADIOCEPHALIC  ARTERIOVENOUS FISTULA;  Surgeon: Conrad Sheldon, MD;  Location: [facility];  Service: Vascular;  Laterality: Left;  . REVISON OF ARTERIOVENOUS FISTULA Left 01/27/2016   Procedure: REVISION OF LEFT UPPER EXTREMITY ARTERIOVENOUS FISTULA;  Surgeon: Waynetta Sandy, MD;  Location: [facility];  Service: Vascular;  Laterality: Left;  . RIGHT HEART CATHETERIZATION N/A 05/21/2011   Procedure: RIGHT HEART CATH;  Surgeon: Birdie Riddle, MD;  Location: [facility] CATH LAB;  Service: Cardiovascular;  Laterality: N/A;  . smashed  1990's   "left pinky; have a plate in there"  . UMBILICAL HERNIA REPAIR N/A 07/04/2012     Procedure: LAPAROSCOPIC UMBILICAL HERNIA;  Surgeon: Madilyn Hook, DO;  Location: [facility];  Service: General;  Laterality: N/A;  . US ECHOCARDIOGRAPHY  05/20/11       Home Medications    Prior to Admission medications   Medication Sig Start Date End Date Taking? Authorizing Provider  allopurinol (ZYLOPRIM) 300 MG tablet Take 150 mg by mouth daily. 12/07/15   Historical Provider, MD  amiodarone (PACERONE) 200 MG tablet Take 0.5 tablets (100 mg total) by mouth daily. 07/05/15   Dixie Dials, MD  amLODipine (NORVASC) 5 MG tablet Take 5 mg by mouth daily.     Historical Provider, MD  carvedilol (COREG) 3.125 MG tablet Take 1 tablet (3.125 mg total) by mouth 2 (two) times daily with a meal. 03/21/16   Kerney Elbe, DO  Cholecalciferol (VITAMIN D PO) Take 1 tablet by mouth daily.    Historical Provider, MD  cinacalcet (SENSIPAR) 90 MG tablet Take 90 mg by mouth daily.     Historical Provider, MD  isosorbide dinitrate (ISORDIL) 20 MG tablet Take 20 mg by mouth daily.     Historical Provider,  MD  lidocaine-prilocaine (EMLA) cream Apply 1 application topically every Monday, Wednesday, and Friday. Apply to port access prior to dialysis 04/08/15   Historical Provider, MD  multivitamin (RENA-VIT) TABS tablet Take 1 tablet by mouth at bedtime. 03/21/16   [place], DO  nitroGLYCERIN (NITROSTAT) 0.4 MG SL tablet Place 0.4 mg under the tongue every 5 (five) minutes as needed for chest pain.  05/20/15   Historical Provider, MD  Nutritional Supplements (FEEDING SUPPLEMENT, NEPRO CARB STEADY,) LIQD Take 237 mLs by mouth 2 (two) times daily between meals. 03/21/16   Kerney Elbe, DO  oxyCODONE-acetaminophen (PERCOCET/ROXICET) 5-325 MG tablet Take 1-2 tablets by mouth every 4 (four) hours as needed for moderate pain. 02/13/16   Jackolyn Confer, MD  pantoprazole (PROTONIX) 40 MG tablet Take 1 tablet (40 mg total) by mouth 2 (two) times daily. 01/27/16   Alvia [place], PA-C  sevelamer carbonate  (RENVELA) 800 MG tablet Take 3 tablets (2,400 mg total) by mouth 3 (three) times daily with meals. 07/05/15   Dixie Dials, MD  warfarin (COUMADIN) 6 MG tablet Take 9 mg by mouth daily. 02/22/16   Historical Provider, MD    Family History Family History  Problem Relation Age of Onset  . Hypertension Mother   . Kidney disease Mother     Social History Social History  Substance Use Topics  . Smoking status: Never Smoker  . Smokeless tobacco: Never Used  . Alcohol use No     Allergies   Ace inhibitors   Review of Systems Review of Systems  Musculoskeletal: Positive for arthralgias and myalgias.  Neurological: Negative for headaches.  All other systems reviewed and are negative.    Physical Exam Updated Vital Signs BP 143/94 (BP Location: Right Arm)   Pulse 86   Temp 97.8 F (36.6 C) (Oral)   Resp 24   Ht 6\' 1"  (1.854 m)   Wt 245 lb (111.1 kg)   SpO2 100%   BMI 32.32 kg/m   Physical Exam  Constitutional: He is oriented to person, place, and time. He appears well-developed and well-nourished. No distress.  HENT:  Head: Normocephalic and atraumatic.  Eyes: Conjunctivae are normal.  Cardiovascular: Normal rate.   Pulmonary/Chest: Effort normal.  Abdominal: He exhibits no distension.  Musculoskeletal: He exhibits tenderness. He exhibits no edema or deformity.  Tenderness at the medial malleolus of the ankle. No swelling or deformity.   Neurological: He is alert and oriented to person, place, and time.  Skin: Skin is warm and dry.  Psychiatric: He has a normal mood and affect.  Nursing note and vitals reviewed.    ED Treatments / Results  DIAGNOSTIC STUDIES:  Oxygen Saturation is 100% on RA, normal by my interpretation.    COORDINATION OF CARE:  11:46 PM Discussed treatment plan with pt at bedside and pt agreed to plan.  Labs (all labs ordered are listed, but only abnormal results are displayed) Labs Reviewed - No data to display  EKG  EKG  Interpretation None       Radiology Dg Ankle Complete Right  Result Date: 06/10/2016 CLINICAL DATA:  Twisted right ankle, pain to the medial aspect EXAM: RIGHT ANKLE - COMPLETE 3+ VIEW COMPARISON:  None. FINDINGS: Suspect tiny avulsion injury of the medial malleolus. No significant displacement. Ankle mortise symmetric. Soft tissue swelling is present. IMPRESSION: Suspect tiny avulsion injury of the medial malleolus. Electronically Signed   By: Donavan Foil M.D.   On: 06/10/2016 22:42   Dg Foot  Complete Right  Result Date: 06/10/2016 CLINICAL DATA:  Twisted ankle, pain medially EXAM: RIGHT FOOT COMPLETE - 3+ VIEW COMPARISON:  None. FINDINGS: There is no evidence of fracture or dislocation. There is no evidence of arthropathy or other focal bone abnormality. Moderate plantar calcaneal spur. IMPRESSION: No acute osseous abnormality Electronically Signed   By: Donavan Foil M.D.   On: 06/10/2016 22:43    Procedures Procedures (including critical care time)  Medications Ordered in ED Medications  oxyCODONE-acetaminophen (PERCOCET/ROXICET) 5-325 MG per tablet 1 tablet (1 tablet Oral Given 06/10/16 2223)  oxyCODONE-acetaminophen (PERCOCET/ROXICET) 5-325 MG per tablet (not administered)  HYDROmorphone (DILAUDID) injection 1 mg (not administered)  ondansetron (ZOFRAN-ODT) disintegrating tablet 8 mg (not administered)     Initial Impression / Assessment and Plan / ED Course  I have reviewed the triage vital signs and the nursing notes.  Pertinent labs & imaging results that were available during my care of the patient were reviewed by me and considered in my medical decision making (see chart for details).    Patient presents to the ER for evaluation of right ankle injury. He did not fall to the ground or hit his head. Patient describes an eversion injury. Patient complaining of pain in the medial aspect of the ankle. There is evidence of a tiny avulsion fracture, no other fractures noted.  Patient placed in a cam walker, administered analgesia. Follow-up with orthopedics.  Final Clinical Impressions(s) / ED Diagnoses   Final diagnoses:  Sprain of deltoid ligament of right ankle, initial encounter    New Prescriptions New Prescriptions   No medications on file  I personally performed the services described in this documentation, which was scribed in my presence. The recorded information has been reviewed and is accurate.     Orpah Greek, MD 06/11/16 [phone]

## 2016-06-10 NOTE — ED Triage Notes (Signed)
Pt states he twisted right ankle today; pt c/o pain at 10/10 on arrival; pt a&ox 4; pt states he is unable to put weight on leg

## 2016-06-10 NOTE — ED Notes (Signed)
ED Provider at bedside. 

## 2016-06-11 DIAGNOSIS — D509 Iron deficiency anemia, unspecified: Secondary | ICD-10-CM | POA: Diagnosis not present

## 2016-06-11 DIAGNOSIS — N2581 Secondary hyperparathyroidism of renal origin: Secondary | ICD-10-CM | POA: Diagnosis not present

## 2016-06-11 DIAGNOSIS — D631 Anemia in chronic kidney disease: Secondary | ICD-10-CM | POA: Diagnosis not present

## 2016-06-11 DIAGNOSIS — I10 Essential (primary) hypertension: Secondary | ICD-10-CM | POA: Diagnosis not present

## 2016-06-11 DIAGNOSIS — N186 End stage renal disease: Secondary | ICD-10-CM | POA: Diagnosis not present

## 2016-06-11 DIAGNOSIS — S93421A Sprain of deltoid ligament of right ankle, initial encounter: Secondary | ICD-10-CM | POA: Diagnosis not present

## 2016-06-11 MED ORDER — OXYCODONE-ACETAMINOPHEN 5-325 MG PO TABS: 1.0000 | ORAL_TABLET | ORAL | 0 refills | Status: DC | PRN

## 2016-06-11 NOTE — ED Notes (Signed)
Wasted Dilaudid in sharps with Darl Householder, RN

## 2016-06-14 DIAGNOSIS — I10 Essential (primary) hypertension: Secondary | ICD-10-CM | POA: Diagnosis not present

## 2016-06-14 DIAGNOSIS — N2581 Secondary hyperparathyroidism of renal origin: Secondary | ICD-10-CM | POA: Diagnosis not present

## 2016-06-14 DIAGNOSIS — D631 Anemia in chronic kidney disease: Secondary | ICD-10-CM | POA: Diagnosis not present

## 2016-06-14 DIAGNOSIS — N186 End stage renal disease: Secondary | ICD-10-CM | POA: Diagnosis not present

## 2016-06-14 DIAGNOSIS — D509 Iron deficiency anemia, unspecified: Secondary | ICD-10-CM | POA: Diagnosis not present

## 2016-06-15 DIAGNOSIS — I871 Compression of vein: Secondary | ICD-10-CM | POA: Diagnosis not present

## 2016-06-15 DIAGNOSIS — Z992 Dependence on renal dialysis: Secondary | ICD-10-CM | POA: Diagnosis not present

## 2016-06-15 DIAGNOSIS — T82858A Stenosis of vascular prosthetic devices, implants and grafts, initial encounter: Secondary | ICD-10-CM | POA: Diagnosis not present

## 2016-06-15 DIAGNOSIS — N186 End stage renal disease: Secondary | ICD-10-CM | POA: Diagnosis not present

## 2016-06-16 DIAGNOSIS — I4892 Unspecified atrial flutter: Secondary | ICD-10-CM | POA: Diagnosis not present

## 2016-06-16 DIAGNOSIS — N2581 Secondary hyperparathyroidism of renal origin: Secondary | ICD-10-CM | POA: Diagnosis not present

## 2016-06-16 DIAGNOSIS — I10 Essential (primary) hypertension: Secondary | ICD-10-CM | POA: Diagnosis not present

## 2016-06-16 DIAGNOSIS — N186 End stage renal disease: Secondary | ICD-10-CM | POA: Diagnosis not present

## 2016-06-16 DIAGNOSIS — D631 Anemia in chronic kidney disease: Secondary | ICD-10-CM | POA: Diagnosis not present

## 2016-06-16 DIAGNOSIS — D509 Iron deficiency anemia, unspecified: Secondary | ICD-10-CM | POA: Diagnosis not present

## 2016-06-16 DIAGNOSIS — E1129 Type 2 diabetes mellitus with other diabetic kidney complication: Secondary | ICD-10-CM | POA: Diagnosis not present

## 2016-06-18 DIAGNOSIS — N186 End stage renal disease: Secondary | ICD-10-CM | POA: Diagnosis not present

## 2016-06-18 DIAGNOSIS — D631 Anemia in chronic kidney disease: Secondary | ICD-10-CM | POA: Diagnosis not present

## 2016-06-18 DIAGNOSIS — D509 Iron deficiency anemia, unspecified: Secondary | ICD-10-CM | POA: Diagnosis not present

## 2016-06-18 DIAGNOSIS — N2581 Secondary hyperparathyroidism of renal origin: Secondary | ICD-10-CM | POA: Diagnosis not present

## 2016-06-18 DIAGNOSIS — I10 Essential (primary) hypertension: Secondary | ICD-10-CM | POA: Diagnosis not present

## 2016-06-21 DIAGNOSIS — N186 End stage renal disease: Secondary | ICD-10-CM | POA: Diagnosis not present

## 2016-06-21 DIAGNOSIS — D631 Anemia in chronic kidney disease: Secondary | ICD-10-CM | POA: Diagnosis not present

## 2016-06-21 DIAGNOSIS — I10 Essential (primary) hypertension: Secondary | ICD-10-CM | POA: Diagnosis not present

## 2016-06-21 DIAGNOSIS — D509 Iron deficiency anemia, unspecified: Secondary | ICD-10-CM | POA: Diagnosis not present

## 2016-06-21 DIAGNOSIS — N2581 Secondary hyperparathyroidism of renal origin: Secondary | ICD-10-CM | POA: Diagnosis not present

## 2016-06-23 DIAGNOSIS — I120 Hypertensive chronic kidney disease with stage 5 chronic kidney disease or end stage renal disease: Secondary | ICD-10-CM | POA: Diagnosis not present

## 2016-06-23 DIAGNOSIS — N186 End stage renal disease: Secondary | ICD-10-CM | POA: Diagnosis not present

## 2016-06-23 DIAGNOSIS — I10 Essential (primary) hypertension: Secondary | ICD-10-CM | POA: Diagnosis not present

## 2016-06-23 DIAGNOSIS — D631 Anemia in chronic kidney disease: Secondary | ICD-10-CM | POA: Diagnosis not present

## 2016-06-23 DIAGNOSIS — D509 Iron deficiency anemia, unspecified: Secondary | ICD-10-CM | POA: Diagnosis not present

## 2016-06-23 DIAGNOSIS — Z992 Dependence on renal dialysis: Secondary | ICD-10-CM | POA: Diagnosis not present

## 2016-06-23 DIAGNOSIS — N2581 Secondary hyperparathyroidism of renal origin: Secondary | ICD-10-CM | POA: Diagnosis not present

## 2016-06-25 DIAGNOSIS — D509 Iron deficiency anemia, unspecified: Secondary | ICD-10-CM | POA: Diagnosis not present

## 2016-06-25 DIAGNOSIS — N2581 Secondary hyperparathyroidism of renal origin: Secondary | ICD-10-CM | POA: Diagnosis not present

## 2016-06-25 DIAGNOSIS — N186 End stage renal disease: Secondary | ICD-10-CM | POA: Diagnosis not present

## 2016-06-25 DIAGNOSIS — D631 Anemia in chronic kidney disease: Secondary | ICD-10-CM | POA: Diagnosis not present

## 2016-06-25 DIAGNOSIS — E1129 Type 2 diabetes mellitus with other diabetic kidney complication: Secondary | ICD-10-CM | POA: Diagnosis not present

## 2016-06-25 DIAGNOSIS — I10 Essential (primary) hypertension: Secondary | ICD-10-CM | POA: Diagnosis not present

## 2016-06-28 DIAGNOSIS — E1129 Type 2 diabetes mellitus with other diabetic kidney complication: Secondary | ICD-10-CM | POA: Diagnosis not present

## 2016-06-28 DIAGNOSIS — N2581 Secondary hyperparathyroidism of renal origin: Secondary | ICD-10-CM | POA: Diagnosis not present

## 2016-06-28 DIAGNOSIS — D631 Anemia in chronic kidney disease: Secondary | ICD-10-CM | POA: Diagnosis not present

## 2016-06-28 DIAGNOSIS — N186 End stage renal disease: Secondary | ICD-10-CM | POA: Diagnosis not present

## 2016-06-28 DIAGNOSIS — D509 Iron deficiency anemia, unspecified: Secondary | ICD-10-CM | POA: Diagnosis not present

## 2016-06-28 DIAGNOSIS — I10 Essential (primary) hypertension: Secondary | ICD-10-CM | POA: Diagnosis not present

## 2016-06-29 DIAGNOSIS — I10 Essential (primary) hypertension: Secondary | ICD-10-CM | POA: Diagnosis not present

## 2016-06-29 DIAGNOSIS — N186 End stage renal disease: Secondary | ICD-10-CM | POA: Diagnosis not present

## 2016-06-29 DIAGNOSIS — E1122 Type 2 diabetes mellitus with diabetic chronic kidney disease: Secondary | ICD-10-CM | POA: Diagnosis not present

## 2016-06-30 DIAGNOSIS — N186 End stage renal disease: Secondary | ICD-10-CM | POA: Diagnosis not present

## 2016-06-30 DIAGNOSIS — E1129 Type 2 diabetes mellitus with other diabetic kidney complication: Secondary | ICD-10-CM | POA: Diagnosis not present

## 2016-06-30 DIAGNOSIS — D631 Anemia in chronic kidney disease: Secondary | ICD-10-CM | POA: Diagnosis not present

## 2016-06-30 DIAGNOSIS — I10 Essential (primary) hypertension: Secondary | ICD-10-CM | POA: Diagnosis not present

## 2016-06-30 DIAGNOSIS — D509 Iron deficiency anemia, unspecified: Secondary | ICD-10-CM | POA: Diagnosis not present

## 2016-06-30 DIAGNOSIS — N2581 Secondary hyperparathyroidism of renal origin: Secondary | ICD-10-CM | POA: Diagnosis not present

## 2016-07-02 DIAGNOSIS — D631 Anemia in chronic kidney disease: Secondary | ICD-10-CM | POA: Diagnosis not present

## 2016-07-02 DIAGNOSIS — N186 End stage renal disease: Secondary | ICD-10-CM | POA: Diagnosis not present

## 2016-07-02 DIAGNOSIS — I10 Essential (primary) hypertension: Secondary | ICD-10-CM | POA: Diagnosis not present

## 2016-07-02 DIAGNOSIS — N2581 Secondary hyperparathyroidism of renal origin: Secondary | ICD-10-CM | POA: Diagnosis not present

## 2016-07-02 DIAGNOSIS — E1129 Type 2 diabetes mellitus with other diabetic kidney complication: Secondary | ICD-10-CM | POA: Diagnosis not present

## 2016-07-02 DIAGNOSIS — D509 Iron deficiency anemia, unspecified: Secondary | ICD-10-CM | POA: Diagnosis not present

## 2016-07-05 DIAGNOSIS — D631 Anemia in chronic kidney disease: Secondary | ICD-10-CM | POA: Diagnosis not present

## 2016-07-05 DIAGNOSIS — N2581 Secondary hyperparathyroidism of renal origin: Secondary | ICD-10-CM | POA: Diagnosis not present

## 2016-07-05 DIAGNOSIS — N186 End stage renal disease: Secondary | ICD-10-CM | POA: Diagnosis not present

## 2016-07-05 DIAGNOSIS — D509 Iron deficiency anemia, unspecified: Secondary | ICD-10-CM | POA: Diagnosis not present

## 2016-07-05 DIAGNOSIS — I10 Essential (primary) hypertension: Secondary | ICD-10-CM | POA: Diagnosis not present

## 2016-07-05 DIAGNOSIS — E1129 Type 2 diabetes mellitus with other diabetic kidney complication: Secondary | ICD-10-CM | POA: Diagnosis not present

## 2016-07-07 DIAGNOSIS — E1129 Type 2 diabetes mellitus with other diabetic kidney complication: Secondary | ICD-10-CM | POA: Diagnosis not present

## 2016-07-07 DIAGNOSIS — N186 End stage renal disease: Secondary | ICD-10-CM | POA: Diagnosis not present

## 2016-07-07 DIAGNOSIS — I10 Essential (primary) hypertension: Secondary | ICD-10-CM | POA: Diagnosis not present

## 2016-07-07 DIAGNOSIS — D631 Anemia in chronic kidney disease: Secondary | ICD-10-CM | POA: Diagnosis not present

## 2016-07-07 DIAGNOSIS — N2581 Secondary hyperparathyroidism of renal origin: Secondary | ICD-10-CM | POA: Diagnosis not present

## 2016-07-07 DIAGNOSIS — D509 Iron deficiency anemia, unspecified: Secondary | ICD-10-CM | POA: Diagnosis not present

## 2016-07-09 DIAGNOSIS — N186 End stage renal disease: Secondary | ICD-10-CM | POA: Diagnosis not present

## 2016-07-09 DIAGNOSIS — E1129 Type 2 diabetes mellitus with other diabetic kidney complication: Secondary | ICD-10-CM | POA: Diagnosis not present

## 2016-07-09 DIAGNOSIS — I10 Essential (primary) hypertension: Secondary | ICD-10-CM | POA: Diagnosis not present

## 2016-07-09 DIAGNOSIS — D509 Iron deficiency anemia, unspecified: Secondary | ICD-10-CM | POA: Diagnosis not present

## 2016-07-09 DIAGNOSIS — D631 Anemia in chronic kidney disease: Secondary | ICD-10-CM | POA: Diagnosis not present

## 2016-07-09 DIAGNOSIS — N2581 Secondary hyperparathyroidism of renal origin: Secondary | ICD-10-CM | POA: Diagnosis not present

## 2016-07-12 DIAGNOSIS — E1129 Type 2 diabetes mellitus with other diabetic kidney complication: Secondary | ICD-10-CM | POA: Diagnosis not present

## 2016-07-12 DIAGNOSIS — D631 Anemia in chronic kidney disease: Secondary | ICD-10-CM | POA: Diagnosis not present

## 2016-07-12 DIAGNOSIS — N2581 Secondary hyperparathyroidism of renal origin: Secondary | ICD-10-CM | POA: Diagnosis not present

## 2016-07-12 DIAGNOSIS — N186 End stage renal disease: Secondary | ICD-10-CM | POA: Diagnosis not present

## 2016-07-12 DIAGNOSIS — I10 Essential (primary) hypertension: Secondary | ICD-10-CM | POA: Diagnosis not present

## 2016-07-12 DIAGNOSIS — D509 Iron deficiency anemia, unspecified: Secondary | ICD-10-CM | POA: Diagnosis not present

## 2016-07-14 DIAGNOSIS — I4892 Unspecified atrial flutter: Secondary | ICD-10-CM | POA: Diagnosis not present

## 2016-07-14 DIAGNOSIS — D631 Anemia in chronic kidney disease: Secondary | ICD-10-CM | POA: Diagnosis not present

## 2016-07-14 DIAGNOSIS — D509 Iron deficiency anemia, unspecified: Secondary | ICD-10-CM | POA: Diagnosis not present

## 2016-07-14 DIAGNOSIS — E1129 Type 2 diabetes mellitus with other diabetic kidney complication: Secondary | ICD-10-CM | POA: Diagnosis not present

## 2016-07-14 DIAGNOSIS — N186 End stage renal disease: Secondary | ICD-10-CM | POA: Diagnosis not present

## 2016-07-14 DIAGNOSIS — I10 Essential (primary) hypertension: Secondary | ICD-10-CM | POA: Diagnosis not present

## 2016-07-14 DIAGNOSIS — N2581 Secondary hyperparathyroidism of renal origin: Secondary | ICD-10-CM | POA: Diagnosis not present

## 2016-07-16 DIAGNOSIS — N2581 Secondary hyperparathyroidism of renal origin: Secondary | ICD-10-CM | POA: Diagnosis not present

## 2016-07-16 DIAGNOSIS — N186 End stage renal disease: Secondary | ICD-10-CM | POA: Diagnosis not present

## 2016-07-16 DIAGNOSIS — D509 Iron deficiency anemia, unspecified: Secondary | ICD-10-CM | POA: Diagnosis not present

## 2016-07-16 DIAGNOSIS — I10 Essential (primary) hypertension: Secondary | ICD-10-CM | POA: Diagnosis not present

## 2016-07-16 DIAGNOSIS — E1129 Type 2 diabetes mellitus with other diabetic kidney complication: Secondary | ICD-10-CM | POA: Diagnosis not present

## 2016-07-16 DIAGNOSIS — D631 Anemia in chronic kidney disease: Secondary | ICD-10-CM | POA: Diagnosis not present

## 2016-07-19 DIAGNOSIS — I10 Essential (primary) hypertension: Secondary | ICD-10-CM | POA: Diagnosis not present

## 2016-07-19 DIAGNOSIS — E1129 Type 2 diabetes mellitus with other diabetic kidney complication: Secondary | ICD-10-CM | POA: Diagnosis not present

## 2016-07-19 DIAGNOSIS — D509 Iron deficiency anemia, unspecified: Secondary | ICD-10-CM | POA: Diagnosis not present

## 2016-07-19 DIAGNOSIS — D631 Anemia in chronic kidney disease: Secondary | ICD-10-CM | POA: Diagnosis not present

## 2016-07-19 DIAGNOSIS — N186 End stage renal disease: Secondary | ICD-10-CM | POA: Diagnosis not present

## 2016-07-19 DIAGNOSIS — N2581 Secondary hyperparathyroidism of renal origin: Secondary | ICD-10-CM | POA: Diagnosis not present

## 2016-07-20 ENCOUNTER — Ambulatory Visit: Payer: Medicare Other

## 2016-07-21 DIAGNOSIS — D509 Iron deficiency anemia, unspecified: Secondary | ICD-10-CM | POA: Diagnosis not present

## 2016-07-21 DIAGNOSIS — N186 End stage renal disease: Secondary | ICD-10-CM | POA: Diagnosis not present

## 2016-07-21 DIAGNOSIS — E1129 Type 2 diabetes mellitus with other diabetic kidney complication: Secondary | ICD-10-CM | POA: Diagnosis not present

## 2016-07-21 DIAGNOSIS — I10 Essential (primary) hypertension: Secondary | ICD-10-CM | POA: Diagnosis not present

## 2016-07-21 DIAGNOSIS — N2581 Secondary hyperparathyroidism of renal origin: Secondary | ICD-10-CM | POA: Diagnosis not present

## 2016-07-21 DIAGNOSIS — Z992 Dependence on renal dialysis: Secondary | ICD-10-CM | POA: Diagnosis not present

## 2016-07-21 DIAGNOSIS — D631 Anemia in chronic kidney disease: Secondary | ICD-10-CM | POA: Diagnosis not present

## 2016-07-21 DIAGNOSIS — I120 Hypertensive chronic kidney disease with stage 5 chronic kidney disease or end stage renal disease: Secondary | ICD-10-CM | POA: Diagnosis not present

## 2016-07-22 ENCOUNTER — Ambulatory Visit (INDEPENDENT_AMBULATORY_CARE_PROVIDER_SITE_OTHER): Payer: Medicare Other | Admitting: Vascular Surgery

## 2016-07-22 ENCOUNTER — Other Ambulatory Visit: Payer: Self-pay | Admitting: Cardiovascular Disease

## 2016-07-22 ENCOUNTER — Ambulatory Visit
Admission: RE | Admit: 2016-07-22 | Discharge: 2016-07-22 | Disposition: A | Discharge status: Home / Self Care | Payer: Medicare Other | Source: Ambulatory Visit | Attending: Cardiovascular Disease | Admitting: Cardiovascular Disease

## 2016-07-22 ENCOUNTER — Encounter: Payer: Self-pay | Admitting: Vascular Surgery

## 2016-07-22 ENCOUNTER — Other Ambulatory Visit: Payer: Self-pay

## 2016-07-22 VITALS — BP 104/76 | HR 76 | Temp 97.6°F | Resp 18 | Ht 73.0 in | Wt 238.0 lb

## 2016-07-22 DIAGNOSIS — I120 Hypertensive chronic kidney disease with stage 5 chronic kidney disease or end stage renal disease: Secondary | ICD-10-CM | POA: Diagnosis not present

## 2016-07-22 DIAGNOSIS — I2510 Atherosclerotic heart disease of native coronary artery without angina pectoris: Secondary | ICD-10-CM | POA: Diagnosis not present

## 2016-07-22 DIAGNOSIS — M545 Low back pain: Secondary | ICD-10-CM

## 2016-07-22 DIAGNOSIS — M5186 Other intervertebral disc disorders, lumbar region: Secondary | ICD-10-CM | POA: Diagnosis not present

## 2016-07-22 DIAGNOSIS — M5136 Other intervertebral disc degeneration, lumbar region: Secondary | ICD-10-CM | POA: Diagnosis not present

## 2016-07-22 DIAGNOSIS — I5022 Chronic systolic (congestive) heart failure: Secondary | ICD-10-CM | POA: Diagnosis not present

## 2016-07-22 DIAGNOSIS — N186 End stage renal disease: Secondary | ICD-10-CM | POA: Diagnosis not present

## 2016-07-22 DIAGNOSIS — T82898D Other specified complication of vascular prosthetic devices, implants and grafts, subsequent encounter: Secondary | ICD-10-CM | POA: Diagnosis not present

## 2016-07-22 NOTE — Progress Notes (Signed)
[facility] [place]  Patient name: [name] MRN: [id number] DOB: 04-May-1961 Sex: male  REASON FOR VISIT: scab on fistula, referred by Dr. Augustin Coupe  HPI: [name] is a 56 y.o. male who presents with "few week" history of scabbing over his left forearm fistula. The patient denies any bleeding issues. This fistula was most recently revised in September 2017 by Dr. Donzetta Matters for ulceration. Prior to this, he had his fistula revised in 2015. His left radial-cephalic fistula was originally placed in 2012.   The patient reports concerns about bleeding due to having a friend pass away recently from a bleeding fistula. He dialyzes on Mondays, Wednesdays and Fridays. He is on coumadin for atrial fibrillation. He denies any chest pain, chest discomfort or shortness of breath.   Past Medical History:  Diagnosis Date  . Atrial flutter ([place])   . 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 Cardiomyopathy   . CHF (congestive heart failure) ([place])   . Coronary artery disease    normal coronaries by 10/10/08 cath, Cardiac Cath 08-04-12 epic.Dr. Doylene Canard follows  . Diabetes mellitus    pt. states he's borderline diabetic., no longer taking med,- off med. since 2013  . Dialysis patient [facility])    Mon-Wed-Fri([place])- Left AV fistula  . Diverticulitis November 2016   reoccurred in December 2016  . ESRD (end stage renal disease) ([place])   . GERD (gastroesophageal reflux disease)   . Gout   . History of nephrectomy 07/04/2012   History of right nephrectomy in 2002 for renal cell carcinoma   . History of unilateral nephrectomy   . Hypertension   . Low iron   . Myocardial infarction ?2006  . Renal cell carcinoma    dialysis- MWF- Dr. Mercy Moore follows.  . Renal insufficiency   . Shortness of breath 05/19/11   "at rest, lying down, w/exertion"  . Stroke [facility]) 02/2011   05/19/11 denies residual  . Umbilical hernia 54/27/06   unrepaired    Family History    Problem Relation Age of Onset  . Hypertension Mother   . Kidney disease Mother     SOCIAL HISTORY: Social History  Substance Use Topics  . Smoking status: Never Smoker  . Smokeless tobacco: Never Used  . Alcohol use No    Allergies  Allergen Reactions  . Ace Inhibitors Itching and Cough    Current Outpatient Prescriptions  Medication Sig Dispense Refill  . allopurinol (ZYLOPRIM) 300 MG tablet Take 150 mg by mouth daily.  6  . amiodarone (PACERONE) 200 MG tablet Take 0.5 tablets (100 mg total) by mouth daily. 15 tablet 6  . amLODipine (NORVASC) 5 MG tablet Take 5 mg by mouth daily.     . carvedilol (COREG) 3.125 MG tablet Take 1 tablet (3.125 mg total) by mouth 2 (two) times daily with a meal. 60 tablet 0  . chlorhexidine (PERIDEX) 0.12 % solution RINSE WITH 1/2 OZ TWICE A DAY  12  . Cholecalciferol (VITAMIN D PO) Take 1 tablet by mouth daily.    . cinacalcet (SENSIPAR) 90 MG tablet Take 90 mg by mouth daily.     . isosorbide dinitrate (ISORDIL) 20 MG tablet Take 20 mg by mouth daily.     Marland Kitchen lidocaine-prilocaine (EMLA) cream Apply 1 application topically every Monday, Wednesday, and Friday. Apply to port access prior to dialysis  12  . multivitamin (RENA-VIT) TABS tablet Take 1 tablet by mouth at bedtime.  30 tablet 0  . nitroGLYCERIN (NITROSTAT) 0.4 MG SL tablet Place 0.4 mg under the tongue every 5 (five) minutes as needed for chest pain.   0  . Nutritional Supplements (FEEDING SUPPLEMENT, NEPRO CARB STEADY,) LIQD Take 237 mLs by mouth 2 (two) times daily between meals. 1 Can 0  . oxyCODONE-acetaminophen (PERCOCET) 5-325 MG tablet Take 1-2 tablets by mouth every 4 (four) hours as needed. 20 tablet 0  . pantoprazole (PROTONIX) 40 MG tablet Take 1 tablet (40 mg total) by mouth 2 (two) times daily. 60 tablet 1  . sevelamer carbonate (RENVELA) 800 MG tablet Take 3 tablets (2,400 mg total) by mouth 3 (three) times daily with meals. 270 tablet 3  . warfarin (COUMADIN) 6 MG tablet Take 9  mg by mouth daily.     No current facility-administered medications for this visit.     REVIEW OF SYSTEMS:  [X]  denotes positive finding, [ ]  denotes negative finding Cardiac  Comments:  Chest pain or chest pressure:    Shortness of breath upon exertion:    Short of breath when lying flat:    Irregular heart rhythm:        Vascular    Pain in calf, thigh, or hip brought on by ambulation:    Pain in feet at night that wakes you up from your sleep:     Blood clot in your veins:    Leg swelling:         Pulmonary    Oxygen at home:    Productive cough:     Wheezing:         Neurologic    Sudden weakness in arms or legs:     Sudden numbness in arms or legs:     Sudden onset of difficulty speaking or slurred speech:    Temporary loss of vision in one eye:     Problems with dizziness:         Gastrointestinal    Blood in stool:     Vomited blood:         Genitourinary    Burning when urinating:     Blood in urine:        Psychiatric    Major depression:         Hematologic    Bleeding problems:    Problems with blood clotting too easily:        Skin    Rashes or ulcers:        Constitutional    Fever or chills:      PHYSICAL EXAM: Vitals:   07/22/16 1538  BP: 104/76  Pulse: 76  Resp: 18  Temp: 97.6 F (36.4 C)  SpO2: 100%  Weight: 238 lb (108 kg)  Height: 6\' 1"  (1.854 m)    GENERAL: The patient is a well-nourished male, in no acute distress. The vital signs are documented above. CARDIAC: There is a regular rate and rhythm. No carotid bruits.  VASCULAR: Left forearm fistula with somewhat pulsatile thrill. There are two aneurysmal segments. The distal aneurysm is tender with small scabs present that is somewhat mobile. There are small scabs overlying proximal aneurysm which are mobile and not felt to be attached to skin. Left upper arm above antecubital fossa with palpable thrill in cephalic vein.  PULMONARY: There is good air exchange bilaterally without  wheezing or rales. MUSCULOSKELETAL: Some muscle atrophy left hand compared to left.  NEUROLOGIC: No focal deficits.  SKIN: See above PSYCHIATRIC: The patient has a  normal affect.   MEDICAL ISSUES: Aneurysmal left forearm fistula ESRD (MWF)  The patient denies any bleeding issues. He has two aneurysmal segments of his fistula that have been repeatedly stuck. The distal aneurysm is of more concern given that scab is not completely mobile over fistula. The area in between these segments has not been stuck. I had Dr. Oneida Alar evaluate patient. Discussed resting these aneurysmal segments versus revision. The patient is concerned over bleeding potential as he had a friend pass away recently from a bleeding fistula. He is on coumadin for atrial fibrillation. Will contact Dr. Doylene Canard regarding his anticoagulation. Plan for revision of left forearm fistula on 08/03/16 with Dr. Oneida Alar. Advised the patient to have his fistula cannulated away from these aneurysmal segments.   Virgina Jock, PA-C [facility] MD: Oneida Alar

## 2016-07-23 ENCOUNTER — Telehealth: Payer: Self-pay

## 2016-07-23 DIAGNOSIS — N186 End stage renal disease: Secondary | ICD-10-CM | POA: Diagnosis not present

## 2016-07-23 DIAGNOSIS — E1129 Type 2 diabetes mellitus with other diabetic kidney complication: Secondary | ICD-10-CM | POA: Diagnosis not present

## 2016-07-23 DIAGNOSIS — N2581 Secondary hyperparathyroidism of renal origin: Secondary | ICD-10-CM | POA: Diagnosis not present

## 2016-07-23 NOTE — Telephone Encounter (Signed)
Spoke with Dr. Doylene Canard regarding [name]' upcoming procedure and his Coumadin. Patient is scheduled for Revision of left arm arteriovenous fistula by Dr. Oneida Alar on 08/03/16. [name] will need to hold his Coumadin for 5 days prior to the procedure. Dr. Doylene Canard states that [name] may hold his Coumadin for 5 days without a Lovenox bridge.

## 2016-07-26 DIAGNOSIS — E1129 Type 2 diabetes mellitus with other diabetic kidney complication: Secondary | ICD-10-CM | POA: Diagnosis not present

## 2016-07-26 DIAGNOSIS — N186 End stage renal disease: Secondary | ICD-10-CM | POA: Diagnosis not present

## 2016-07-26 DIAGNOSIS — N2581 Secondary hyperparathyroidism of renal origin: Secondary | ICD-10-CM | POA: Diagnosis not present

## 2016-07-28 ENCOUNTER — Ambulatory Visit (INDEPENDENT_AMBULATORY_CARE_PROVIDER_SITE_OTHER): Payer: Medicare Other

## 2016-07-28 ENCOUNTER — Ambulatory Visit (HOSPITAL_COMMUNITY)
Admission: EM | Admit: 2016-07-28 | Discharge: 2016-07-28 | Disposition: A | Discharge status: Home / Self Care | Payer: Medicare Other | Attending: Family Medicine | Admitting: Family Medicine

## 2016-07-28 ENCOUNTER — Encounter (HOSPITAL_COMMUNITY): Payer: Self-pay | Admitting: Emergency Medicine

## 2016-07-28 DIAGNOSIS — S92514A Nondisplaced fracture of proximal phalanx of right lesser toe(s), initial encounter for closed fracture: Secondary | ICD-10-CM | POA: Diagnosis not present

## 2016-07-28 DIAGNOSIS — N186 End stage renal disease: Secondary | ICD-10-CM | POA: Diagnosis not present

## 2016-07-28 DIAGNOSIS — N2581 Secondary hyperparathyroidism of renal origin: Secondary | ICD-10-CM | POA: Diagnosis not present

## 2016-07-28 DIAGNOSIS — M79671 Pain in right foot: Secondary | ICD-10-CM | POA: Diagnosis not present

## 2016-07-28 DIAGNOSIS — E1129 Type 2 diabetes mellitus with other diabetic kidney complication: Secondary | ICD-10-CM | POA: Diagnosis not present

## 2016-07-28 MED ORDER — TRAMADOL HCL 50 MG PO TABS: 50.0000 mg | ORAL_TABLET | Freq: Four times a day (QID) | ORAL | 0 refills | Status: DC | PRN

## 2016-07-28 NOTE — ED Provider Notes (Addendum)
CSN: [id number]     Arrival date & time 07/28/16  1105 History   First MD Initiated Contact with Patient 07/28/16 1240     Chief Complaint  Patient presents with  . Foot Pain   (Consider location/radiation/quality/duration/timing/severity/associated sxs/prior Treatment) 56 year old male states he was walking a couple days ago and stubbed his right fifth toe on a hard object. He is complaining of pain at the base of the toe. No other injury.      Past Medical History:  Diagnosis Date  . Atrial flutter ([place])   . 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 Cardiomyopathy   . CHF (congestive heart failure) ([place])   . Coronary artery disease    normal coronaries by 10/10/08 cath, Cardiac Cath 08-04-12 epic.Dr. Doylene Canard follows  . Diabetes mellitus    pt. states he's borderline diabetic., no longer taking med,- off med. since 2013  . Dialysis patient [facility])    Mon-Wed-Fri([place])- Left AV fistula  . Diverticulitis November 2016   reoccurred in December 2016  . ESRD (end stage renal disease) ([place])   . GERD (gastroesophageal reflux disease)   . Gout   . History of nephrectomy 07/04/2012   History of right nephrectomy in 2002 for renal cell carcinoma   . History of unilateral nephrectomy   . Hypertension   . Low iron   . Myocardial infarction ?2006  . Renal cell carcinoma    dialysis- MWF- Dr. Mercy Moore follows.  . Renal insufficiency   . Shortness of breath 05/19/11   "at rest, lying down, w/exertion"  . Stroke [facility]) 02/2011   05/19/11 denies residual  . Umbilical hernia 79/15/05   unrepaired   Past Surgical History:  Procedure Laterality Date  . AV FISTULA PLACEMENT  03/29/2011   Procedure: ARTERIOVENOUS (AV) FISTULA CREATION;  Surgeon: Hinda Lenis, MD;  Location: [place];  Service: Vascular;  Laterality: Left;  LEFT RADIOCEPHALIC , Arteriovenous [id number](16553)  . CARDIAC CATHETERIZATION  05/21/11  . CARDIAC CATHETERIZATION N/A 03/16/2016   Procedure: Left Heart Cath and Coronary Angiography;  Surgeon: Dixie Dials, MD;  Location: [place] CV LAB;  Service: Cardiovascular;  Laterality: N/A;  . CHOLECYSTECTOMY N/A 02/12/2016   Procedure: LAPAROSCOPIC CHOLECYSTECTOMY WITH INTRAOPERATIVE CHOLANGIOGRAM;  Surgeon: Jackolyn Confer, MD;  Location: [place];  Service: General;  Laterality: N/A;  . dialysis cath placed    . ESOPHAGOGASTRODUODENOSCOPY (EGD) WITH PROPOFOL N/A 12/02/2015   Procedure: ESOPHAGOGASTRODUODENOSCOPY (EGD) WITH PROPOFOL;  Surgeon: Juanita Craver, MD;  Location: [facility];  Service: Endoscopy;  Laterality: N/A;  . FINGER SURGERY     L pinkie finger- ORIF- /w remaining hardware - 1990's    . HERNIA REPAIR N/A 7/48/2707   Umbilical hernia repair  . INSERTION OF DIALYSIS CATHETER N/A 01/27/2016   Procedure: INSERTION OF DIALYSIS CATHETER;  Surgeon: Waynetta Sandy, MD;  Location: [place];  Service: Vascular;  Laterality: N/A;  . INSERTION OF MESH N/A 07/04/2012   Procedure: INSERTION OF MESH;  Surgeon: Madilyn Hook, DO;  Location: [place];  Service: General;  Laterality: N/A;  . LAPAROSCOPIC LYSIS OF ADHESIONS N/A 02/12/2016   Procedure: LAPAROSCOPIC LYSIS OF ADHESIONS;  Surgeon: Jackolyn Confer, MD;  Location: [place];  Service: General;  Laterality: N/A;  . LEFT AND RIGHT HEART CATHETERIZATION WITH CORONARY ANGIOGRAM N/A 08/04/2012   Procedure: LEFT AND RIGHT HEART CATHETERIZATION WITH CORONARY ANGIOGRAM;  Surgeon: Birdie Riddle, MD;  Location: [place] CATH LAB;  Service: Cardiovascular;  Laterality: N/A;  . NEPHRECTOMY  2000   right  . REVISON OF ARTERIOVENOUS FISTULA Left 06/05/2013   Procedure: REVISON OF LEFT RADIOCEPHALIC  ARTERIOVENOUS FISTULA;  Surgeon: Conrad Millingport, MD;  Location: [place];  Service: Vascular;  Laterality: Left;  . REVISON OF ARTERIOVENOUS FISTULA Left 01/27/2016   Procedure: REVISION OF LEFT UPPER EXTREMITY ARTERIOVENOUS FISTULA;  Surgeon: Waynetta Sandy, MD;  Location: [place];  Service:  Vascular;  Laterality: Left;  . RIGHT HEART CATHETERIZATION N/A 05/21/2011   Procedure: RIGHT HEART CATH;  Surgeon: Birdie Riddle, MD;  Location: [facility] C[facility] LAB;  Service: Cardiovascular;  Laterality: N/A;  . smashed  1990's   "left pinky; have a plate in there"  . UMBILICAL HERNIA REPAIR N/A 07/04/2012   Procedure: LAPAROSCOPIC UMBILICAL HERNIA;  Surgeon: Madilyn Hook, DO;  Location: [place];  Service: General;  Laterality: N/A;  . US ECHOCARDIOGRAPHY  05/20/11   Family History  Problem Relation Age of Onset  . Hypertension Mother   . Kidney disease Mother    Social History  Substance Use Topics  . Smoking status: Never Smoker  . Smokeless tobacco: Never Used  . Alcohol use No    Review of Systems  Constitutional: Negative.   Musculoskeletal:       As per history of present illness.  All other systems reviewed and are negative.   Allergies  Ace inhibitors  Home Medications   Prior to Admission medications   Medication Sig Start Date End Date Taking? Authorizing Provider  allopurinol (ZYLOPRIM) 300 MG tablet Take 150 mg by mouth daily. 12/07/15   Historical Provider, MD  amiodarone (PACERONE) 200 MG tablet Take 0.5 tablets (100 mg total) by mouth daily. 07/05/15   Dixie Dials, MD  amLODipine (NORVASC) 5 MG tablet Take 5 mg by mouth daily.     Historical Provider, MD  carvedilol (COREG) 3.125 MG tablet Take 1 tablet (3.125 mg total) by mouth 2 (two) times daily with a meal. 03/21/16   Butler, DO  chlorhexidine (PERIDEX) 0.12 % solution RINSE WITH 1/2 OZ TWICE A DAY 06/25/16   Historical Provider, MD  Cholecalciferol (VITAMIN D PO) Take 1 tablet by mouth daily.    Historical Provider, MD  cinacalcet (SENSIPAR) 90 MG tablet Take 90 mg by mouth daily.     Historical Provider, MD  isosorbide dinitrate (ISORDIL) 20 MG tablet Take 20 mg by mouth daily.     Historical Provider, MD  lidocaine-prilocaine (EMLA) cream Apply 1 application topically every Monday, Wednesday, and  Friday. Apply to port access prior to dialysis 04/08/15   Historical Provider, MD  multivitamin (RENA-VIT) TABS tablet Take 1 tablet by mouth at bedtime. 03/21/16   Porter, DO  nitroGLYCERIN (NITROSTAT) 0.4 MG SL tablet Place 0.4 mg under the tongue every 5 (five) minutes as needed for chest pain.  05/20/15   Historical Provider, MD  Nutritional Supplements (FEEDING SUPPLEMENT, NEPRO CARB STEADY,) LIQD Take 237 mLs by mouth 2 (two) times daily between meals. 03/21/16   Kerney Elbe, DO  oxyCODONE-acetaminophen (PERCOCET) 5-325 MG tablet Take 1-2 tablets by mouth every 4 (four) hours as needed. 06/11/16   Orpah Greek, MD  pantoprazole (PROTONIX) 40 MG tablet Take 1 tablet (40 mg total) by mouth 2 (two) times daily. 01/27/16   Alvia Grove, PA-C  sevelamer carbonate (RENVELA) 800 MG tablet Take 3 tablets (2,400 mg total) by mouth 3 (three) times daily with meals. 07/05/15   Dixie Dials, MD  traMADol (ULTRAM) 50 MG tablet Take 1 tablet (50 mg total) by mouth every 6 (six) hours as needed. 07/28/16   Janne Napoleon, NP  warfarin (COUMADIN) 6 MG tablet Take 9 mg by mouth daily. 02/22/16   Historical Provider, MD   Meds Ordered and Administered this Visit  Medications - No data to display  BP (!) 85/51 (BP Location: Right Arm) Comment (BP Location): large cuff, just left dialysis  Pulse 99   Temp 97 F (36.1 C) (Oral)   Resp 20   SpO2 100%  No data found.   Physical Exam  Constitutional: He is oriented to person, place, and time. He appears well-developed and well-nourished.  HENT:  Head: Normocephalic and atraumatic.  Eyes: EOM are normal. Left eye exhibits no discharge.  Neck: Normal range of motion. Neck supple.  Musculoskeletal:  Right fifth toe with minor bruising at the base. Tenderness in the same location. No deformity. Pain with passive flexion and extension. No swelling. Distal sensation intact. No apparent injury to the foot. Normal warmth and color. Pedal  pulse 2+  Neurological: He is alert and oriented to person, place, and time. No cranial nerve deficit.  Skin: Skin is warm and dry.  Psychiatric: He has a normal mood and affect.  Nursing note and vitals reviewed.   Urgent Care Course     Procedures (including critical care time)  Labs Review Labs Reviewed - No data to display  Imaging Review Dg Foot Complete Right  Result Date: 07/28/2016 CLINICAL DATA:  Right foot pain, pain in the pinky EXAM: RIGHT FOOT COMPLETE - 3+ VIEW COMPARISON:  06/10/2014 FINDINGS: Mild cortical irregularity along the medial base of the fifth proximal phalanx on the oblique view which may artifact versus reflect a healing fracture although there is no surrounding callus or periostitis. There is no evidence of other fracture or dislocation. There is no evidence of arthropathy or other focal bone abnormality. Soft tissues are unremarkable. IMPRESSION: Mild cortical irregularity along the medial base of the fifth proximal phalanx on the oblique view which may artifact versus reflect a healing fracture although there is no surrounding callus or periostitis. No evidence of other fracture or dislocation. Electronically Signed   By: Kathreen Devoid   On: 07/28/2016 12:58     Visual Acuity Review  Right Eye Distance:   Left Eye Distance:   Bilateral Distance:    Right Eye Near:   Left Eye Near:    Bilateral Near:         MDM   1. Closed nondisplaced fracture of proximal phalanx of lesser toe of right foot, initial encounter    Keep the toes buddy taped and wear the hard sole shoe for the next couple weeks to allow the toe to heel. He may remove the hard sole shoe periodically as needed. Follow-up with your doctor as needed. Meds ordered this encounter  Medications  . traMADol (ULTRAM) 50 MG tablet    Sig: Take 1 tablet (50 mg total) by mouth every 6 (six) hours as needed.    Dispense:  15 tablet    Refill:  0    Order Specific Question:   Supervising  Provider    Answer:   Robyn Haber [[id number]]       Janne Napoleon, NP 07/28/16 1331    Janne Napoleon, NP 07/28/16 [place], NP 07/28/16 1722

## 2016-07-28 NOTE — ED Triage Notes (Signed)
See s/s.  Kicked the corner of a couch yesterday

## 2016-07-28 NOTE — Discharge Instructions (Signed)
Keep the toes buddy taped and wear the hard sole shoe for the next couple weeks to allow the toe to heel. He may remove the hard sole shoe periodically as needed. Follow-up with your doctor as needed.

## 2016-07-28 NOTE — ED Notes (Addendum)
Patient completed dialysis today.  Prior to taking blood pressure, patient reports bp running low lately.  Reports todays reading is no different than what it has been running lately

## 2016-07-30 ENCOUNTER — Encounter (HOSPITAL_COMMUNITY): Payer: Self-pay | Admitting: *Deleted

## 2016-07-30 DIAGNOSIS — N2581 Secondary hyperparathyroidism of renal origin: Secondary | ICD-10-CM | POA: Diagnosis not present

## 2016-07-30 DIAGNOSIS — N186 End stage renal disease: Secondary | ICD-10-CM | POA: Diagnosis not present

## 2016-07-30 DIAGNOSIS — E1129 Type 2 diabetes mellitus with other diabetic kidney complication: Secondary | ICD-10-CM | POA: Diagnosis not present

## 2016-07-30 NOTE — Progress Notes (Signed)
Pt denies SOB and chest pain but is under the care of Dr. Doylene Canard, Cardiology. Pt stated that his last dose of Coumadin was Wednesday. Pt made aware to stop taking vitamins, fish oil and herbal medications. Do not take any NSAIDs ie: Ibuprofen, Advil, Naproxen, BC and Goody Powder. Pt made aware to check BG every 2 hours prior to arrival and interventions for a BG < 70 ( drink 4 ounces of apple or cranberry juice, recheck BG 15 minutes after drinking juice and call SS if BG remains < 70 after intervention. Pt verbalized understanding of all pre-op instructions. Anesthesia asked to review pt cardiac history.

## 2016-08-02 DIAGNOSIS — N186 End stage renal disease: Secondary | ICD-10-CM | POA: Diagnosis not present

## 2016-08-02 DIAGNOSIS — N2581 Secondary hyperparathyroidism of renal origin: Secondary | ICD-10-CM | POA: Diagnosis not present

## 2016-08-02 DIAGNOSIS — E1129 Type 2 diabetes mellitus with other diabetic kidney complication: Secondary | ICD-10-CM | POA: Diagnosis not present

## 2016-08-02 NOTE — Progress Notes (Signed)
Anesthesia Chart Review:  Pt is a same day work up.   Pt is a 56 year old male scheduled for revision of L AV fistula on 08/03/2016 with Ruta Hinds, MD.   Cardiologist is Dixie Dials, MD. Last office visit note has been requested.   PMH includes:  CAD (mild), CHF, nonischemic cardiomyopathy, atrial flutter, HTN, DM, stroke (2012), ESRD on hemodialysis, renal cell carcinoma (s/p R nephrectomy 2002).  Blind in R eye. Never smoker. BMI 31.5.   S/p cholecystectomy 02/12/16. S/p umbilical hernia repair 11/11/28. Has undergone several dialysis access procedures, most recently 01/27/16.   Medications include: amiodarone, carvedilol, isordil, protonix, coumadin.  Pt to hold coumadin 5 days before surgery.   Preoperative labs will be obtained DOS.   CXR 03/14/16: Stable cardiomegaly with mild central vascular congestion. No overt edema, consolidation or effusion. Atherosclerotic vascular disease of the aorta.  CT angio chest 03/15/16:  1. No CT evidence of aortic dissection or central pulmonary artery embolus. 2. Inflammatory changes of the cholecystectomy with small amount of fluid in the subhepatic area concerning for an infectious process or bile leak. Correlation with clinical exam recommended. A hepatobiliary scintigraphy may provide better evaluation and assistant for possible chronic. No drainable fluid collection. 3. Colonic diverticulosis without active inflammatory changes. No evidence of bowel obstruction. Normal appendix.  EKG 03/14/16: Sinus rhythm. LVH. Nonspecific T abnormalities, lateral leads. Borderline prolonged QT interval  Cardiac cath 03/16/16:   Prox LAD lesion, 25% stenosed.  There is moderate left ventricular systolic dysfunction.  LV end diastolic pressure is normal.  The left ventricular ejection fraction is 25-35% by visual estimate.  There is trivial (1+) mitral regurgitation.  Echo 06/30/15:  - Left ventricle: The cavity size was severely dilated. There was  moderate concentric hypertrophy. Systolic function was severely reduced. The estimated ejection fraction was in the range of 20% to 25%. Diffuse hypokinesis. - Aortic valve: There was trivial regurgitation. - Mitral valve: There was moderate regurgitation. - Left atrium: The atrium was severely dilated. - Right ventricle: The cavity size was mildly dilated. Wall   thickness was normal. Systolic function was mildly to moderately reduced. - Right atrium: The atrium was moderately dilated. - Tricuspid valve: There was moderate regurgitation. - Pulmonary arteries: Systolic pressure was mildly to moderately increased. PA peak pressure: 52 mm Hg (S).  Pt's cardiomyopathy is long-standing. Stress test results from 2004 report his EF was 12% at that time. Oldest echo in [place] records dated 10/07/09 notes EF was 25-30% (and was in this range on echo 05/20/11, 10/06/12, and 01/14/15.) Most recent echo 06/29/15 shows EF was 20-25%.  Pt will need further assessment DOS by assigned anesthesiologist.   Willeen Cass, FNP-BC [facility]/Anesthesiology Phone: [phone] 08/02/2016 12:50 PM

## 2016-08-03 ENCOUNTER — Encounter (HOSPITAL_COMMUNITY)
Admission: RE | Disposition: A | Discharge status: Home / Self Care | Payer: Self-pay | Source: Ambulatory Visit | Attending: Vascular Surgery

## 2016-08-03 ENCOUNTER — Ambulatory Visit (HOSPITAL_COMMUNITY)
Admission: RE | Admit: 2016-08-03 | Discharge: 2016-08-03 | Disposition: A | Discharge status: Home / Self Care | Payer: Medicare Other | Source: Ambulatory Visit | Attending: Vascular Surgery | Admitting: Vascular Surgery

## 2016-08-03 ENCOUNTER — Ambulatory Visit (HOSPITAL_COMMUNITY): Payer: Medicare Other | Admitting: Emergency Medicine

## 2016-08-03 ENCOUNTER — Encounter (HOSPITAL_COMMUNITY): Payer: Self-pay | Admitting: *Deleted

## 2016-08-03 DIAGNOSIS — K219 Gastro-esophageal reflux disease without esophagitis: Secondary | ICD-10-CM | POA: Insufficient documentation

## 2016-08-03 DIAGNOSIS — I252 Old myocardial infarction: Secondary | ICD-10-CM | POA: Diagnosis not present

## 2016-08-03 DIAGNOSIS — Y832 Surgical operation with anastomosis, bypass or graft as the cause of abnormal reaction of the patient, or of later complication, without mention of misadventure at the time of the procedure: Secondary | ICD-10-CM | POA: Diagnosis not present

## 2016-08-03 DIAGNOSIS — Z8673 Personal history of transient ischemic attack (TIA), and cerebral infarction without residual deficits: Secondary | ICD-10-CM | POA: Insufficient documentation

## 2016-08-03 DIAGNOSIS — I2510 Atherosclerotic heart disease of native coronary artery without angina pectoris: Secondary | ICD-10-CM | POA: Insufficient documentation

## 2016-08-03 DIAGNOSIS — T82898A Other specified complication of vascular prosthetic devices, implants and grafts, initial encounter: Secondary | ICD-10-CM | POA: Insufficient documentation

## 2016-08-03 DIAGNOSIS — N185 Chronic kidney disease, stage 5: Secondary | ICD-10-CM | POA: Diagnosis not present

## 2016-08-03 DIAGNOSIS — Z905 Acquired absence of kidney: Secondary | ICD-10-CM | POA: Insufficient documentation

## 2016-08-03 DIAGNOSIS — I509 Heart failure, unspecified: Secondary | ICD-10-CM | POA: Diagnosis not present

## 2016-08-03 DIAGNOSIS — I132 Hypertensive heart and chronic kidney disease with heart failure and with stage 5 chronic kidney disease, or end stage renal disease: Secondary | ICD-10-CM | POA: Diagnosis not present

## 2016-08-03 DIAGNOSIS — E1122 Type 2 diabetes mellitus with diabetic chronic kidney disease: Secondary | ICD-10-CM | POA: Insufficient documentation

## 2016-08-03 DIAGNOSIS — Z85528 Personal history of other malignant neoplasm of kidney: Secondary | ICD-10-CM | POA: Insufficient documentation

## 2016-08-03 DIAGNOSIS — I4891 Unspecified atrial fibrillation: Secondary | ICD-10-CM | POA: Diagnosis not present

## 2016-08-03 DIAGNOSIS — Z79899 Other long term (current) drug therapy: Secondary | ICD-10-CM | POA: Diagnosis not present

## 2016-08-03 DIAGNOSIS — Z992 Dependence on renal dialysis: Secondary | ICD-10-CM | POA: Diagnosis not present

## 2016-08-03 DIAGNOSIS — N186 End stage renal disease: Secondary | ICD-10-CM | POA: Insufficient documentation

## 2016-08-03 DIAGNOSIS — I429 Cardiomyopathy, unspecified: Secondary | ICD-10-CM | POA: Diagnosis not present

## 2016-08-03 DIAGNOSIS — H5461 Unqualified visual loss, right eye, normal vision left eye: Secondary | ICD-10-CM | POA: Diagnosis not present

## 2016-08-03 DIAGNOSIS — Z7901 Long term (current) use of anticoagulants: Secondary | ICD-10-CM | POA: Insufficient documentation

## 2016-08-03 DIAGNOSIS — M109 Gout, unspecified: Secondary | ICD-10-CM | POA: Insufficient documentation

## 2016-08-03 DIAGNOSIS — I120 Hypertensive chronic kidney disease with stage 5 chronic kidney disease or end stage renal disease: Secondary | ICD-10-CM | POA: Diagnosis not present

## 2016-08-03 HISTORY — PX: REVISON OF ARTERIOVENOUS FISTULA: SHX6074

## 2016-08-03 LAB — POCT I-STAT 4, (NA,K, GLUC, HGB,HCT)
Glucose, Bld: 98 mg/dL (ref 65–99)
HCT: 40 % (ref 39.0–52.0)
Hemoglobin: 13.6 g/dL (ref 13.0–17.0)
Potassium: 4.8 mmol/L (ref 3.5–5.1)
Sodium: 139 mmol/L (ref 135–145)

## 2016-08-03 LAB — PROTIME-INR
INR: 1.03
Prothrombin Time: 13.5 seconds (ref 11.4–15.2)

## 2016-08-03 LAB — APTT: aPTT: 35 seconds (ref 24–36)

## 2016-08-03 LAB — GLUCOSE, CAPILLARY: Glucose-Capillary: 101 mg/dL — ABNORMAL HIGH (ref 65–99)

## 2016-08-03 SURGERY — REVISON OF ARTERIOVENOUS FISTULA
Anesthesia: General | Site: Arm Lower | Laterality: Left

## 2016-08-03 MED ORDER — MIDODRINE HCL 5 MG PO TABS
10.0000 mg | ORAL_TABLET | Freq: Three times a day (TID) | ORAL | Status: DC
  Administered 2016-08-03: 10 mg via ORAL
  Filled 2016-08-03: qty 2

## 2016-08-03 MED ORDER — HEPARIN SODIUM (PORCINE) 1000 UNIT/ML IJ SOLN
INTRAMUSCULAR | Status: DC | PRN
  Administered 2016-08-03: 5000 [IU] via INTRAVENOUS

## 2016-08-03 MED ORDER — HYDROMORPHONE HCL 1 MG/ML IJ SOLN
0.2500 mg | INTRAMUSCULAR | Status: DC | PRN
  Administered 2016-08-03: 0.5 mg via INTRAVENOUS

## 2016-08-03 MED ORDER — SODIUM CHLORIDE 0.9 % IV SOLN: INTRAVENOUS | Status: DC

## 2016-08-03 MED ORDER — WARFARIN SODIUM 6 MG PO TABS: 12.0000 mg | ORAL_TABLET | Freq: Every day | ORAL | Status: DC

## 2016-08-03 MED ORDER — HEPARIN SODIUM (PORCINE) 1000 UNIT/ML IJ SOLN
INTRAMUSCULAR | Status: AC
  Filled 2016-08-03: qty 1

## 2016-08-03 MED ORDER — PROTAMINE SULFATE 10 MG/ML IV SOLN
INTRAVENOUS | Status: AC
  Filled 2016-08-03: qty 5

## 2016-08-03 MED ORDER — PHENYLEPHRINE 40 MCG/ML (10ML) SYRINGE FOR IV PUSH (FOR BLOOD PRESSURE SUPPORT)
PREFILLED_SYRINGE | INTRAVENOUS | Status: AC
  Filled 2016-08-03: qty 10

## 2016-08-03 MED ORDER — 0.9 % SODIUM CHLORIDE (POUR BTL) OPTIME
TOPICAL | Status: DC | PRN
  Administered 2016-08-03: 1000 mL

## 2016-08-03 MED ORDER — FENTANYL CITRATE (PF) 100 MCG/2ML IJ SOLN
INTRAMUSCULAR | Status: DC | PRN
Start: 2016-08-03 — End: 2016-08-03
  Administered 2016-08-03 (×3): 25 ug via INTRAVENOUS

## 2016-08-03 MED ORDER — PHENYLEPHRINE 40 MCG/ML (10ML) SYRINGE FOR IV PUSH (FOR BLOOD PRESSURE SUPPORT)
PREFILLED_SYRINGE | INTRAVENOUS | Status: DC | PRN
  Administered 2016-08-03 (×2): 80 ug via INTRAVENOUS

## 2016-08-03 MED ORDER — DEXTROSE 5 % IV SOLN
INTRAVENOUS | Status: AC
  Filled 2016-08-03: qty 1.5

## 2016-08-03 MED ORDER — ONDANSETRON HCL 4 MG/2ML IJ SOLN
INTRAMUSCULAR | Status: AC
  Filled 2016-08-03: qty 2

## 2016-08-03 MED ORDER — MIDODRINE HCL 10 MG PO TABS: 10.0000 mg | ORAL_TABLET | Freq: Three times a day (TID) | ORAL | 1 refills | Status: DC

## 2016-08-03 MED ORDER — DEXTROSE 5 % IV SOLN
1.5000 g | INTRAVENOUS | Status: AC
  Administered 2016-08-03: 1.5 g via INTRAVENOUS

## 2016-08-03 MED ORDER — ONDANSETRON HCL 4 MG/2ML IJ SOLN
INTRAMUSCULAR | Status: DC | PRN
  Administered 2016-08-03: 4 mg via INTRAVENOUS

## 2016-08-03 MED ORDER — ALBUMIN HUMAN 5 % IV SOLN
INTRAVENOUS | Status: DC | PRN
  Administered 2016-08-03 (×2): via INTRAVENOUS

## 2016-08-03 MED ORDER — WARFARIN SODIUM 6 MG PO TABS: 6.0000 mg | ORAL_TABLET | Freq: Every day | ORAL | Status: DC

## 2016-08-03 MED ORDER — GLYCOPYRROLATE 0.2 MG/ML IV SOSY
PREFILLED_SYRINGE | INTRAVENOUS | Status: DC | PRN
  Administered 2016-08-03 (×2): .2 mg via INTRAVENOUS

## 2016-08-03 MED ORDER — ALBUMIN HUMAN 5 % IV SOLN
INTRAVENOUS | Status: AC
  Filled 2016-08-03: qty 250

## 2016-08-03 MED ORDER — PHENYLEPHRINE HCL 10 MG/ML IJ SOLN
INTRAVENOUS | Status: DC | PRN
  Administered 2016-08-03: 25 ug/min via INTRAVENOUS

## 2016-08-03 MED ORDER — MIDAZOLAM HCL 5 MG/5ML IJ SOLN
INTRAMUSCULAR | Status: DC | PRN
  Administered 2016-08-03: 1 mg via INTRAVENOUS

## 2016-08-03 MED ORDER — EPHEDRINE SULFATE-NACL 50-0.9 MG/10ML-% IV SOSY
PREFILLED_SYRINGE | INTRAVENOUS | Status: DC | PRN
  Administered 2016-08-03: 15 mg via INTRAVENOUS

## 2016-08-03 MED ORDER — OXYCODONE-ACETAMINOPHEN 5-325 MG PO TABS: 1.0000 | ORAL_TABLET | ORAL | 0 refills | Status: DC | PRN

## 2016-08-03 MED ORDER — FENTANYL CITRATE (PF) 100 MCG/2ML IJ SOLN
INTRAMUSCULAR | Status: AC
  Filled 2016-08-03: qty 2

## 2016-08-03 MED ORDER — SODIUM CHLORIDE 0.9 % IV SOLN
INTRAVENOUS | Status: DC | PRN
  Administered 2016-08-03: 500 mL

## 2016-08-03 MED ORDER — PROPOFOL 10 MG/ML IV BOLUS
INTRAVENOUS | Status: DC | PRN
  Administered 2016-08-03: 50 mg via INTRAVENOUS
  Administered 2016-08-03: 150 mg via INTRAVENOUS

## 2016-08-03 MED ORDER — EPHEDRINE 5 MG/ML INJ
INTRAVENOUS | Status: AC
  Filled 2016-08-03: qty 10

## 2016-08-03 MED ORDER — MIDAZOLAM HCL 2 MG/2ML IJ SOLN
INTRAMUSCULAR | Status: AC
  Filled 2016-08-03: qty 2

## 2016-08-03 MED ORDER — PROPOFOL 10 MG/ML IV BOLUS
INTRAVENOUS | Status: AC
  Filled 2016-08-03: qty 20

## 2016-08-03 MED ORDER — PROTAMINE SULFATE 10 MG/ML IV SOLN
INTRAVENOUS | Status: DC | PRN
  Administered 2016-08-03: 50 mg via INTRAVENOUS

## 2016-08-03 MED ORDER — ALBUMIN HUMAN 5 % IV SOLN
12.5000 g | Freq: Once | INTRAVENOUS | Status: AC
  Administered 2016-08-03: 12.5 g via INTRAVENOUS

## 2016-08-03 MED ORDER — CHLORHEXIDINE GLUCONATE CLOTH 2 % EX PADS: 6.0000 | MEDICATED_PAD | Freq: Once | CUTANEOUS | Status: DC

## 2016-08-03 MED ORDER — LIDOCAINE 2% (20 MG/ML) 5 ML SYRINGE
INTRAMUSCULAR | Status: DC | PRN
  Administered 2016-08-03: 100 mg via INTRAVENOUS

## 2016-08-03 MED ORDER — SODIUM CHLORIDE 0.9 % IV SOLN
INTRAVENOUS | Status: DC
  Administered 2016-08-03: 10 mL/h via INTRAVENOUS

## 2016-08-03 MED ORDER — HYDROMORPHONE HCL 1 MG/ML IJ SOLN
INTRAMUSCULAR | Status: AC
  Filled 2016-08-03: qty 0.5

## 2016-08-03 MED ORDER — SEVELAMER CARBONATE 800 MG PO TABS: 1600.0000 mg | ORAL_TABLET | ORAL | Status: DC

## 2016-08-03 SURGICAL SUPPLY — 36 items
ADH SKN CLS APL DERMABOND .7 (GAUZE/BANDAGES/DRESSINGS) ×1
AGENT HMST SPONGE THK3/8 (HEMOSTASIS)
ARMBAND PINK RESTRICT EXTREMIT (MISCELLANEOUS) ×2 IMPLANT
CANISTER SUCT 3000ML PPV (MISCELLANEOUS) ×2 IMPLANT
CANNULA VESSEL 3MM 2 BLNT TIP (CANNULA) ×2 IMPLANT
CLIP TI MEDIUM 6 (CLIP) ×2 IMPLANT
CLIP TI WIDE RED SMALL 6 (CLIP) ×2 IMPLANT
COVER PROBE W GEL 5X96 (DRAPES) ×2 IMPLANT
DECANTER SPIKE VIAL GLASS SM (MISCELLANEOUS) ×2 IMPLANT
DERMABOND ADVANCED (GAUZE/BANDAGES/DRESSINGS) ×1
DERMABOND ADVANCED .7 DNX12 (GAUZE/BANDAGES/DRESSINGS) ×1 IMPLANT
DRAIN PENROSE 1/4X12 LTX STRL (WOUND CARE) ×2 IMPLANT
ELECT REM PT RETURN 9FT ADLT (ELECTROSURGICAL) ×2
ELECTRODE REM PT RTRN 9FT ADLT (ELECTROSURGICAL) ×1 IMPLANT
GLOVE BIO SURGEON STRL SZ7 (GLOVE) ×1 IMPLANT
GLOVE BIO SURGEON STRL SZ7.5 (GLOVE) ×2 IMPLANT
GLOVE BIOGEL PI IND STRL 6.5 (GLOVE) IMPLANT
GLOVE BIOGEL PI INDICATOR 6.5 (GLOVE) ×3
GLOVE SKINSENSE NS SZ6.5 (GLOVE) ×2
GLOVE SKINSENSE STRL SZ6.5 (GLOVE) IMPLANT
GOWN STRL REUS W/ TWL LRG LVL3 (GOWN DISPOSABLE) ×3 IMPLANT
GOWN STRL REUS W/TWL LRG LVL3 (GOWN DISPOSABLE) ×6
HEMOSTAT SPONGE AVITENE ULTRA (HEMOSTASIS) IMPLANT
KIT BASIN OR (CUSTOM PROCEDURE TRAY) ×2 IMPLANT
KIT ROOM TURNOVER OR (KITS) ×2 IMPLANT
LOOP VESSEL MINI RED (MISCELLANEOUS) IMPLANT
NS IRRIG 1000ML POUR BTL (IV SOLUTION) ×2 IMPLANT
PACK CV ACCESS (CUSTOM PROCEDURE TRAY) ×2 IMPLANT
PAD ARMBOARD 7.5X6 YLW CONV (MISCELLANEOUS) ×4 IMPLANT
SUT PROLENE 5 0 C 1 24 (SUTURE) ×2 IMPLANT
SUT PROLENE 7 0 BV 1 (SUTURE) ×2 IMPLANT
SUT VIC AB 3-0 SH 27 (SUTURE) ×2
SUT VIC AB 3-0 SH 27X BRD (SUTURE) ×1 IMPLANT
SUT VICRYL 4-0 PS2 18IN ABS (SUTURE) ×2 IMPLANT
UNDERPAD 30X30 (UNDERPADS AND DIAPERS) ×2 IMPLANT
WATER STERILE IRR 1000ML POUR (IV SOLUTION) ×2 IMPLANT

## 2016-08-03 NOTE — Progress Notes (Signed)
Per Maudie Mercury PA for Dr Oneida Alar, PTs BP is low on HD, pt started on midrodrine and will go home with this, DR Conrad Humptulips made aware and oked pt to [facility]

## 2016-08-03 NOTE — Op Note (Signed)
Procedure: Revision left Brachial Cephalic AV Fistula with aneurysm plication PreOp: Aneurysmal degeneration AVF left arm PostOp: same Anesthesia: General Asst: Silva Bandy, PA-c  Findings: Plication of approximately 3 cm length 4 cm diameter aneurysm  Operative details: After obtaining informed consent, the patient was taken to the operating room. The patient was placed in supine position on the operating table. After commencing general anesthesia, the patient's entire left upper extremity was prepped and draped in the usual sterile fashion. A elliptical incision was made around a fistula aneurysm in the distal forearm. The incision was carried into the subcutaneous tissues down to level of the pre-existing AV fistula. The fistula was patent and did have a thrill within it. The fisutula was dissected free circumferentially proximal and distal to the aneurysm as well as the aneurysm.  The patient was given 5000 units of intravenous heparin. The fistula was clamped proximal and distal to the aneurysm. A longitudinal opening was made in the aneurysm with an . I resected several centimeters of aneurysm tissue from both walls and the re approximated the aneurysm with large bites of tissue using a running 5 0 prolene leaving a residual 6-7 mm diameter fistula.  Just prior to completion of the anastomosis, it was forebled backbled and thoroughly flushed.  The anastomosis was secured; clamps released; and  a palpable thrill was palpable in the fistula immediately.  Hemostasis was obtained.  Next subcutaneous tissues of each incision were reapproximated using interrupted 3-0 Vicryl suture. The skin was closed with a 4 0 Vicryl subcuticular stitch. The patient tolerated the procedure well and there were no complications. Instrument sponge and needle counts were correct at the end of the case. The patient was taken to the recovery room in stable condition.  Ruta Hinds, MD [facility]  [place] Office: [phone] Pager: [phone]

## 2016-08-03 NOTE — Progress Notes (Signed)
notifed of dr Jolyn Lent of pts bp 76/58 ordersobtained and carried out

## 2016-08-03 NOTE — Transfer of Care (Signed)
Immediate Anesthesia Transfer of Care Note  Patient: [name]  Procedure(s) Performed: Procedure(s): REVISON OF Left arm ARTERIOVENOUS FISTULA (Left)  Patient Location: PACU  Anesthesia Type:General  Level of Consciousness: awake, alert , oriented and patient cooperative  Airway & Oxygen Therapy: Patient Spontanous Breathing and Patient connected to face mask oxygen  Post-op Assessment: Report given to RN and Post -op Vital signs reviewed and stable  Post vital signs: Reviewed and stable  Last Vitals:  Vitals:   08/03/16 0812  BP: (!) 100/45  Pulse: 100  Resp: 20  Temp: 36.8 C    Last Pain:  Vitals:   08/03/16 0812  TempSrc: Oral      Patients Stated Pain Goal: 3 (56/15/37 9432)  Complications: No apparent anesthesia complications

## 2016-08-03 NOTE — Interval H&P Note (Signed)
History and Physical Interval Note:  08/03/2016 7:50 AM  [name]  has presented today for surgery, with the diagnosis of End Stage Renal Disease N18.6  The various methods of treatment have been discussed with the patient and family. After consideration of risks, benefits and other options for treatment, the patient has consented to  Procedure(s): REVISON OF ARTERIOVENOUS FISTULA (Left) as a surgical intervention .  The patient's history has been reviewed, patient examined, no change in status, stable for surgery.  I have reviewed the patient's chart and labs.  Questions were answered to the patient's satisfaction.     Ruta Hinds

## 2016-08-03 NOTE — Discharge Instructions (Signed)
Restart coumadin (usual home dose) tomorrow. Please schedule INR follow-up by the end of this week.

## 2016-08-03 NOTE — Anesthesia Procedure Notes (Signed)
Procedure Name: LMA Insertion Date/Time: 08/03/2016 9:07 AM Performed by: Everlean Cherry A Pre-anesthesia Checklist: Patient identified, Emergency Drugs available, Suction available and Patient being monitored Patient Re-evaluated:Patient Re-evaluated prior to inductionOxygen Delivery Method: Circle system utilized Preoxygenation: Pre-oxygenation with 100% oxygen Intubation Type: IV induction Ventilation: Mask ventilation without difficulty LMA: LMA inserted LMA Size: 5.0 Number of attempts: 1 Placement Confirmation: positive ETCO2 and breath sounds checked- equal and bilateral Tube secured with: Tape Dental Injury: Teeth and Oropharynx as per pre-operative assessment

## 2016-08-03 NOTE — Anesthesia Postprocedure Evaluation (Signed)
Anesthesia Post Note  Patient: [name]  Procedure(s) Performed: Procedure(s) (LRB): REVISON OF Left arm ARTERIOVENOUS FISTULA (Left)  Patient location during evaluation: PACU Anesthesia Type: General Pain management: pain level controlled Vital Signs Assessment: post-procedure vital signs reviewed and stable Respiratory status: spontaneous breathing Cardiovascular status: stable Anesthetic complications: no       Last Vitals:  Vitals:   08/03/16 1103 08/03/16 1118  BP: (!) 86/57 (!) 79/61  Pulse: (!) 101 99  Resp: (!) 22 18  Temp:      Last Pain:  Vitals:   08/03/16 1100  TempSrc:   PainSc: Asleep                 Benay Pomeroy

## 2016-08-03 NOTE — H&P (View-Only) (Signed)
[facility] [place]  Patient name: [name] MRN: [id number] DOB: Feb 19, 1961 Sex: male  REASON FOR VISIT: scab on fistula, referred by Dr. Augustin Coupe  HPI: [name] is a 56 y.o. male who presents with "few week" history of scabbing over his left forearm fistula. The patient denies any bleeding issues. This fistula was most recently revised in September 2017 by Dr. Donzetta Matters for ulceration. Prior to this, he had his fistula revised in 2015. His left radial-cephalic fistula was originally placed in 2012.   The patient reports concerns about bleeding due to having a friend pass away recently from a bleeding fistula. He dialyzes on Mondays, Wednesdays and Fridays. He is on coumadin for atrial fibrillation. He denies any chest pain, chest discomfort or shortness of breath.   Past Medical History:  Diagnosis Date  . Atrial flutter ([place])   . 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 Cardiomyopathy   . CHF (congestive heart failure) ([place])   . Coronary artery disease    normal coronaries by 10/10/08 cath, Cardiac Cath 08-04-12 epic.Dr. Doylene Canard follows  . Diabetes mellitus    pt. states he's borderline diabetic., no longer taking med,- off med. since 2013  . Dialysis patient [facility])    Mon-Wed-Fri([place])- Left AV fistula  . Diverticulitis November 2016   reoccurred in December 2016  . ESRD (end stage renal disease) ([place])   . GERD (gastroesophageal reflux disease)   . Gout   . History of nephrectomy 07/04/2012   History of right nephrectomy in 2002 for renal cell carcinoma   . History of unilateral nephrectomy   . Hypertension   . Low iron   . Myocardial infarction ?2006  . Renal cell carcinoma    dialysis- MWF- Dr. Mercy Moore follows.  . Renal insufficiency   . Shortness of breath 05/19/11   "at rest, lying down, w/exertion"  . Stroke [facility]) 02/2011   05/19/11 denies residual  . Umbilical hernia 24/40/10   unrepaired    Family History    Problem Relation Age of Onset  . Hypertension Mother   . Kidney disease Mother     SOCIAL HISTORY: Social History  Substance Use Topics  . Smoking status: Never Smoker  . Smokeless tobacco: Never Used  . Alcohol use No    Allergies  Allergen Reactions  . Ace Inhibitors Itching and Cough    Current Outpatient Prescriptions  Medication Sig Dispense Refill  . allopurinol (ZYLOPRIM) 300 MG tablet Take 150 mg by mouth daily.  6  . amiodarone (PACERONE) 200 MG tablet Take 0.5 tablets (100 mg total) by mouth daily. 15 tablet 6  . amLODipine (NORVASC) 5 MG tablet Take 5 mg by mouth daily.     . carvedilol (COREG) 3.125 MG tablet Take 1 tablet (3.125 mg total) by mouth 2 (two) times daily with a meal. 60 tablet 0  . chlorhexidine (PERIDEX) 0.12 % solution RINSE WITH 1/2 OZ TWICE A DAY  12  . Cholecalciferol (VITAMIN D PO) Take 1 tablet by mouth daily.    . cinacalcet (SENSIPAR) 90 MG tablet Take 90 mg by mouth daily.     . isosorbide dinitrate (ISORDIL) 20 MG tablet Take 20 mg by mouth daily.     Marland Kitchen lidocaine-prilocaine (EMLA) cream Apply 1 application topically every Monday, Wednesday, and Friday. Apply to port access prior to dialysis  12  . multivitamin (RENA-VIT) TABS tablet Take 1 tablet by mouth at bedtime.  30 tablet 0  . nitroGLYCERIN (NITROSTAT) 0.4 MG SL tablet Place 0.4 mg under the tongue every 5 (five) minutes as needed for chest pain.   0  . Nutritional Supplements (FEEDING SUPPLEMENT, NEPRO CARB STEADY,) LIQD Take 237 mLs by mouth 2 (two) times daily between meals. 1 Can 0  . oxyCODONE-acetaminophen (PERCOCET) 5-325 MG tablet Take 1-2 tablets by mouth every 4 (four) hours as needed. 20 tablet 0  . pantoprazole (PROTONIX) 40 MG tablet Take 1 tablet (40 mg total) by mouth 2 (two) times daily. 60 tablet 1  . sevelamer carbonate (RENVELA) 800 MG tablet Take 3 tablets (2,400 mg total) by mouth 3 (three) times daily with meals. 270 tablet 3  . warfarin (COUMADIN) 6 MG tablet Take 9  mg by mouth daily.     No current facility-administered medications for this visit.     REVIEW OF SYSTEMS:  [X]  denotes positive finding, [ ]  denotes negative finding Cardiac  Comments:  Chest pain or chest pressure:    Shortness of breath upon exertion:    Short of breath when lying flat:    Irregular heart rhythm:        Vascular    Pain in calf, thigh, or hip brought on by ambulation:    Pain in feet at night that wakes you up from your sleep:     Blood clot in your veins:    Leg swelling:         Pulmonary    Oxygen at home:    Productive cough:     Wheezing:         Neurologic    Sudden weakness in arms or legs:     Sudden numbness in arms or legs:     Sudden onset of difficulty speaking or slurred speech:    Temporary loss of vision in one eye:     Problems with dizziness:         Gastrointestinal    Blood in stool:     Vomited blood:         Genitourinary    Burning when urinating:     Blood in urine:        Psychiatric    Major depression:         Hematologic    Bleeding problems:    Problems with blood clotting too easily:        Skin    Rashes or ulcers:        Constitutional    Fever or chills:      PHYSICAL EXAM: Vitals:   07/22/16 1538  BP: 104/76  Pulse: 76  Resp: 18  Temp: 97.6 F (36.4 C)  SpO2: 100%  Weight: 238 lb (108 kg)  Height: 6\' 1"  (1.854 m)    GENERAL: The patient is a well-nourished male, in no acute distress. The vital signs are documented above. CARDIAC: There is a regular rate and rhythm. No carotid bruits.  VASCULAR: Left forearm fistula with somewhat pulsatile thrill. There are two aneurysmal segments. The distal aneurysm is tender with small scabs present that is somewhat mobile. There are small scabs overlying proximal aneurysm which are mobile and not felt to be attached to skin. Left upper arm above antecubital fossa with palpable thrill in cephalic vein.  PULMONARY: There is good air exchange bilaterally without  wheezing or rales. MUSCULOSKELETAL: Some muscle atrophy left hand compared to left.  NEUROLOGIC: No focal deficits.  SKIN: See above PSYCHIATRIC: The patient has a  normal affect.   MEDICAL ISSUES: Aneurysmal left forearm fistula ESRD (MWF)  The patient denies any bleeding issues. He has two aneurysmal segments of his fistula that have been repeatedly stuck. The distal aneurysm is of more concern given that scab is not completely mobile over fistula. The area in between these segments has not been stuck. I had Dr. Oneida Alar evaluate patient. Discussed resting these aneurysmal segments versus revision. The patient is concerned over bleeding potential as he had a friend pass away recently from a bleeding fistula. He is on coumadin for atrial fibrillation. Will contact Dr. Doylene Canard regarding his anticoagulation. Plan for revision of left forearm fistula on 08/03/16 with Dr. Oneida Alar. Advised the patient to have his fistula cannulated away from these aneurysmal segments.   Virgina Jock, PA-C [facility] MD: Oneida Alar

## 2016-08-03 NOTE — Anesthesia Preprocedure Evaluation (Addendum)
Anesthesia Evaluation  Patient identified by MRN, date of birth, ID band Patient awake    History of Anesthesia Complications (+) POST - OP SPINAL HEADACHE  Airway Mallampati: II  TM Distance: >3 FB Neck ROM: Full    Dental  (+) Teeth Intact, Dental Advisory Given   Pulmonary shortness of breath,    breath sounds clear to auscultation       Cardiovascular hypertension, Pt. on home beta blockers + CAD, + Past MI and +CHF   Rhythm:Regular Rate:Normal     Neuro/Psych    GI/Hepatic GERD  Medicated,  Endo/Other  diabetes, Type 2  Renal/GU ESRF and DialysisRenal disease     Musculoskeletal   Abdominal   Peds  Hematology   Anesthesia Other Findings   Reproductive/Obstetrics                           Anesthesia Physical Anesthesia Plan  ASA: IV  Anesthesia Plan: General   Post-op Pain Management:    Induction: Intravenous  Airway Management Planned: LMA  Additional Equipment:   Intra-op Plan:   Post-operative Plan:   Informed Consent: I have reviewed the patients History and Physical, chart, labs and discussed the procedure including the risks, benefits and alternatives for the proposed anesthesia with the patient or authorized representative who has indicated his/her understanding and acceptance.   Dental advisory given  Plan Discussed with: CRNA, Anesthesiologist and Surgeon  Anesthesia Plan Comments:         Anesthesia Quick Evaluation

## 2016-08-03 NOTE — Progress Notes (Signed)
Spoke with Maudie Mercury PA re: pts BP and DRGreen wanting renal to see pt prior to dc

## 2016-08-04 ENCOUNTER — Telehealth: Payer: Self-pay

## 2016-08-04 ENCOUNTER — Encounter (HOSPITAL_COMMUNITY): Payer: Self-pay | Admitting: Vascular Surgery

## 2016-08-04 DIAGNOSIS — N2581 Secondary hyperparathyroidism of renal origin: Secondary | ICD-10-CM | POA: Diagnosis not present

## 2016-08-04 DIAGNOSIS — N186 End stage renal disease: Secondary | ICD-10-CM | POA: Diagnosis not present

## 2016-08-04 DIAGNOSIS — E1129 Type 2 diabetes mellitus with other diabetic kidney complication: Secondary | ICD-10-CM | POA: Diagnosis not present

## 2016-08-04 NOTE — Telephone Encounter (Signed)
-----   Message from Mena Goes, RN sent at 08/04/2016  9:20 AM EDT ----- Regarding: FW: patient call   ----- Message ----- From: Angelia Mould, MD Sent: 08/03/2016   9:24 PM To: [facility] Charge Pool Subject: patient call                                   This patient had plication of an aneurysm today. His wife called as it was some mild swelling at the incision. I offered to see him in the emergency department and he does not think the swelling is that significant. If it worsens he will come to the emergency room tonight or call the office tomorrow. CD

## 2016-08-04 NOTE — Telephone Encounter (Signed)
Courtesy call to pt., following phone call to the On Call MD last evening.  The pt. Stated the swelling went down.  Advised to continue to monitor surgical site.  encouraged to elevate the left arm on pillows, to position above level of heart, at intervals.  Advised the early phase of healing may show signs of some swelling and slight redness; advised this will last the 1st 3-5 days post op.  Encouraged to report any s/s of infection; ie: increased redness/ warmth/tenderness, fever/ chills, or incisional drainage. Encouraged to call office if any concerns.  Verb. Understanding.

## 2016-08-06 DIAGNOSIS — N186 End stage renal disease: Secondary | ICD-10-CM | POA: Diagnosis not present

## 2016-08-06 DIAGNOSIS — N2581 Secondary hyperparathyroidism of renal origin: Secondary | ICD-10-CM | POA: Diagnosis not present

## 2016-08-06 DIAGNOSIS — E1129 Type 2 diabetes mellitus with other diabetic kidney complication: Secondary | ICD-10-CM | POA: Diagnosis not present

## 2016-08-09 DIAGNOSIS — N186 End stage renal disease: Secondary | ICD-10-CM | POA: Diagnosis not present

## 2016-08-09 DIAGNOSIS — E1129 Type 2 diabetes mellitus with other diabetic kidney complication: Secondary | ICD-10-CM | POA: Diagnosis not present

## 2016-08-09 DIAGNOSIS — N2581 Secondary hyperparathyroidism of renal origin: Secondary | ICD-10-CM | POA: Diagnosis not present

## 2016-08-11 DIAGNOSIS — N186 End stage renal disease: Secondary | ICD-10-CM | POA: Diagnosis not present

## 2016-08-11 DIAGNOSIS — E1129 Type 2 diabetes mellitus with other diabetic kidney complication: Secondary | ICD-10-CM | POA: Diagnosis not present

## 2016-08-11 DIAGNOSIS — N2581 Secondary hyperparathyroidism of renal origin: Secondary | ICD-10-CM | POA: Diagnosis not present

## 2016-08-13 DIAGNOSIS — N186 End stage renal disease: Secondary | ICD-10-CM | POA: Diagnosis not present

## 2016-08-13 DIAGNOSIS — E1129 Type 2 diabetes mellitus with other diabetic kidney complication: Secondary | ICD-10-CM | POA: Diagnosis not present

## 2016-08-13 DIAGNOSIS — N2581 Secondary hyperparathyroidism of renal origin: Secondary | ICD-10-CM | POA: Diagnosis not present

## 2016-08-16 DIAGNOSIS — N186 End stage renal disease: Secondary | ICD-10-CM | POA: Diagnosis not present

## 2016-08-16 DIAGNOSIS — N2581 Secondary hyperparathyroidism of renal origin: Secondary | ICD-10-CM | POA: Diagnosis not present

## 2016-08-16 DIAGNOSIS — E1129 Type 2 diabetes mellitus with other diabetic kidney complication: Secondary | ICD-10-CM | POA: Diagnosis not present

## 2016-08-18 DIAGNOSIS — N186 End stage renal disease: Secondary | ICD-10-CM | POA: Diagnosis not present

## 2016-08-18 DIAGNOSIS — I4892 Unspecified atrial flutter: Secondary | ICD-10-CM | POA: Diagnosis not present

## 2016-08-18 DIAGNOSIS — E1129 Type 2 diabetes mellitus with other diabetic kidney complication: Secondary | ICD-10-CM | POA: Diagnosis not present

## 2016-08-18 DIAGNOSIS — N2581 Secondary hyperparathyroidism of renal origin: Secondary | ICD-10-CM | POA: Diagnosis not present

## 2016-08-19 DIAGNOSIS — I5022 Chronic systolic (congestive) heart failure: Secondary | ICD-10-CM | POA: Diagnosis not present

## 2016-08-19 DIAGNOSIS — I120 Hypertensive chronic kidney disease with stage 5 chronic kidney disease or end stage renal disease: Secondary | ICD-10-CM | POA: Diagnosis not present

## 2016-08-19 DIAGNOSIS — E6609 Other obesity due to excess calories: Secondary | ICD-10-CM | POA: Diagnosis not present

## 2016-08-19 DIAGNOSIS — N186 End stage renal disease: Secondary | ICD-10-CM | POA: Diagnosis not present

## 2016-08-19 DIAGNOSIS — Z7901 Long term (current) use of anticoagulants: Secondary | ICD-10-CM | POA: Diagnosis not present

## 2016-08-19 DIAGNOSIS — I2510 Atherosclerotic heart disease of native coronary artery without angina pectoris: Secondary | ICD-10-CM | POA: Diagnosis not present

## 2016-08-20 DIAGNOSIS — N186 End stage renal disease: Secondary | ICD-10-CM | POA: Diagnosis not present

## 2016-08-20 DIAGNOSIS — N2581 Secondary hyperparathyroidism of renal origin: Secondary | ICD-10-CM | POA: Diagnosis not present

## 2016-08-20 DIAGNOSIS — E1129 Type 2 diabetes mellitus with other diabetic kidney complication: Secondary | ICD-10-CM | POA: Diagnosis not present

## 2016-08-21 DIAGNOSIS — N186 End stage renal disease: Secondary | ICD-10-CM | POA: Diagnosis not present

## 2016-08-21 DIAGNOSIS — I120 Hypertensive chronic kidney disease with stage 5 chronic kidney disease or end stage renal disease: Secondary | ICD-10-CM | POA: Diagnosis not present

## 2016-08-21 DIAGNOSIS — Z992 Dependence on renal dialysis: Secondary | ICD-10-CM | POA: Diagnosis not present

## 2016-08-23 DIAGNOSIS — E1129 Type 2 diabetes mellitus with other diabetic kidney complication: Secondary | ICD-10-CM | POA: Diagnosis not present

## 2016-08-23 DIAGNOSIS — N2581 Secondary hyperparathyroidism of renal origin: Secondary | ICD-10-CM | POA: Diagnosis not present

## 2016-08-23 DIAGNOSIS — D631 Anemia in chronic kidney disease: Secondary | ICD-10-CM | POA: Diagnosis not present

## 2016-08-23 DIAGNOSIS — N186 End stage renal disease: Secondary | ICD-10-CM | POA: Diagnosis not present

## 2016-08-25 DIAGNOSIS — N2581 Secondary hyperparathyroidism of renal origin: Secondary | ICD-10-CM | POA: Diagnosis not present

## 2016-08-25 DIAGNOSIS — D631 Anemia in chronic kidney disease: Secondary | ICD-10-CM | POA: Diagnosis not present

## 2016-08-25 DIAGNOSIS — E1129 Type 2 diabetes mellitus with other diabetic kidney complication: Secondary | ICD-10-CM | POA: Diagnosis not present

## 2016-08-25 DIAGNOSIS — N186 End stage renal disease: Secondary | ICD-10-CM | POA: Diagnosis not present

## 2016-08-27 DIAGNOSIS — N2581 Secondary hyperparathyroidism of renal origin: Secondary | ICD-10-CM | POA: Diagnosis not present

## 2016-08-27 DIAGNOSIS — E1129 Type 2 diabetes mellitus with other diabetic kidney complication: Secondary | ICD-10-CM | POA: Diagnosis not present

## 2016-08-27 DIAGNOSIS — N186 End stage renal disease: Secondary | ICD-10-CM | POA: Diagnosis not present

## 2016-08-27 DIAGNOSIS — D631 Anemia in chronic kidney disease: Secondary | ICD-10-CM | POA: Diagnosis not present

## 2016-08-30 DIAGNOSIS — N186 End stage renal disease: Secondary | ICD-10-CM | POA: Diagnosis not present

## 2016-08-30 DIAGNOSIS — N2581 Secondary hyperparathyroidism of renal origin: Secondary | ICD-10-CM | POA: Diagnosis not present

## 2016-08-30 DIAGNOSIS — E1129 Type 2 diabetes mellitus with other diabetic kidney complication: Secondary | ICD-10-CM | POA: Diagnosis not present

## 2016-08-30 DIAGNOSIS — D631 Anemia in chronic kidney disease: Secondary | ICD-10-CM | POA: Diagnosis not present

## 2016-09-01 DIAGNOSIS — N2581 Secondary hyperparathyroidism of renal origin: Secondary | ICD-10-CM | POA: Diagnosis not present

## 2016-09-01 DIAGNOSIS — D631 Anemia in chronic kidney disease: Secondary | ICD-10-CM | POA: Diagnosis not present

## 2016-09-01 DIAGNOSIS — N186 End stage renal disease: Secondary | ICD-10-CM | POA: Diagnosis not present

## 2016-09-01 DIAGNOSIS — E1129 Type 2 diabetes mellitus with other diabetic kidney complication: Secondary | ICD-10-CM | POA: Diagnosis not present

## 2016-09-03 DIAGNOSIS — N186 End stage renal disease: Secondary | ICD-10-CM | POA: Diagnosis not present

## 2016-09-03 DIAGNOSIS — N2581 Secondary hyperparathyroidism of renal origin: Secondary | ICD-10-CM | POA: Diagnosis not present

## 2016-09-03 DIAGNOSIS — D631 Anemia in chronic kidney disease: Secondary | ICD-10-CM | POA: Diagnosis not present

## 2016-09-03 DIAGNOSIS — E1129 Type 2 diabetes mellitus with other diabetic kidney complication: Secondary | ICD-10-CM | POA: Diagnosis not present

## 2016-09-06 DIAGNOSIS — E1129 Type 2 diabetes mellitus with other diabetic kidney complication: Secondary | ICD-10-CM | POA: Diagnosis not present

## 2016-09-06 DIAGNOSIS — N186 End stage renal disease: Secondary | ICD-10-CM | POA: Diagnosis not present

## 2016-09-06 DIAGNOSIS — N2581 Secondary hyperparathyroidism of renal origin: Secondary | ICD-10-CM | POA: Diagnosis not present

## 2016-09-06 DIAGNOSIS — D631 Anemia in chronic kidney disease: Secondary | ICD-10-CM | POA: Diagnosis not present

## 2016-09-08 DIAGNOSIS — E1129 Type 2 diabetes mellitus with other diabetic kidney complication: Secondary | ICD-10-CM | POA: Diagnosis not present

## 2016-09-08 DIAGNOSIS — N186 End stage renal disease: Secondary | ICD-10-CM | POA: Diagnosis not present

## 2016-09-08 DIAGNOSIS — N2581 Secondary hyperparathyroidism of renal origin: Secondary | ICD-10-CM | POA: Diagnosis not present

## 2016-09-08 DIAGNOSIS — D631 Anemia in chronic kidney disease: Secondary | ICD-10-CM | POA: Diagnosis not present

## 2016-09-10 DIAGNOSIS — D631 Anemia in chronic kidney disease: Secondary | ICD-10-CM | POA: Diagnosis not present

## 2016-09-10 DIAGNOSIS — N2581 Secondary hyperparathyroidism of renal origin: Secondary | ICD-10-CM | POA: Diagnosis not present

## 2016-09-10 DIAGNOSIS — E1129 Type 2 diabetes mellitus with other diabetic kidney complication: Secondary | ICD-10-CM | POA: Diagnosis not present

## 2016-09-10 DIAGNOSIS — N186 End stage renal disease: Secondary | ICD-10-CM | POA: Diagnosis not present

## 2016-09-13 DIAGNOSIS — N186 End stage renal disease: Secondary | ICD-10-CM | POA: Diagnosis not present

## 2016-09-13 DIAGNOSIS — D631 Anemia in chronic kidney disease: Secondary | ICD-10-CM | POA: Diagnosis not present

## 2016-09-13 DIAGNOSIS — E1129 Type 2 diabetes mellitus with other diabetic kidney complication: Secondary | ICD-10-CM | POA: Diagnosis not present

## 2016-09-13 DIAGNOSIS — N2581 Secondary hyperparathyroidism of renal origin: Secondary | ICD-10-CM | POA: Diagnosis not present

## 2016-09-15 DIAGNOSIS — E1129 Type 2 diabetes mellitus with other diabetic kidney complication: Secondary | ICD-10-CM | POA: Diagnosis not present

## 2016-09-15 DIAGNOSIS — I4892 Unspecified atrial flutter: Secondary | ICD-10-CM | POA: Diagnosis not present

## 2016-09-15 DIAGNOSIS — N2581 Secondary hyperparathyroidism of renal origin: Secondary | ICD-10-CM | POA: Diagnosis not present

## 2016-09-15 DIAGNOSIS — N186 End stage renal disease: Secondary | ICD-10-CM | POA: Diagnosis not present

## 2016-09-15 DIAGNOSIS — D631 Anemia in chronic kidney disease: Secondary | ICD-10-CM | POA: Diagnosis not present

## 2016-09-17 DIAGNOSIS — N186 End stage renal disease: Secondary | ICD-10-CM | POA: Diagnosis not present

## 2016-09-17 DIAGNOSIS — N2581 Secondary hyperparathyroidism of renal origin: Secondary | ICD-10-CM | POA: Diagnosis not present

## 2016-09-17 DIAGNOSIS — E1129 Type 2 diabetes mellitus with other diabetic kidney complication: Secondary | ICD-10-CM | POA: Diagnosis not present

## 2016-09-17 DIAGNOSIS — D631 Anemia in chronic kidney disease: Secondary | ICD-10-CM | POA: Diagnosis not present

## 2016-09-20 DIAGNOSIS — E1129 Type 2 diabetes mellitus with other diabetic kidney complication: Secondary | ICD-10-CM | POA: Diagnosis not present

## 2016-09-20 DIAGNOSIS — I120 Hypertensive chronic kidney disease with stage 5 chronic kidney disease or end stage renal disease: Secondary | ICD-10-CM | POA: Diagnosis not present

## 2016-09-20 DIAGNOSIS — Z992 Dependence on renal dialysis: Secondary | ICD-10-CM | POA: Diagnosis not present

## 2016-09-20 DIAGNOSIS — D631 Anemia in chronic kidney disease: Secondary | ICD-10-CM | POA: Diagnosis not present

## 2016-09-20 DIAGNOSIS — N2581 Secondary hyperparathyroidism of renal origin: Secondary | ICD-10-CM | POA: Diagnosis not present

## 2016-09-20 DIAGNOSIS — N186 End stage renal disease: Secondary | ICD-10-CM | POA: Diagnosis not present

## 2016-09-22 DIAGNOSIS — D631 Anemia in chronic kidney disease: Secondary | ICD-10-CM | POA: Diagnosis not present

## 2016-09-22 DIAGNOSIS — N2581 Secondary hyperparathyroidism of renal origin: Secondary | ICD-10-CM | POA: Diagnosis not present

## 2016-09-22 DIAGNOSIS — N186 End stage renal disease: Secondary | ICD-10-CM | POA: Diagnosis not present

## 2016-09-22 DIAGNOSIS — E1129 Type 2 diabetes mellitus with other diabetic kidney complication: Secondary | ICD-10-CM | POA: Diagnosis not present

## 2016-09-23 DIAGNOSIS — I5022 Chronic systolic (congestive) heart failure: Secondary | ICD-10-CM | POA: Diagnosis not present

## 2016-09-23 DIAGNOSIS — I120 Hypertensive chronic kidney disease with stage 5 chronic kidney disease or end stage renal disease: Secondary | ICD-10-CM | POA: Diagnosis not present

## 2016-09-23 DIAGNOSIS — Z7901 Long term (current) use of anticoagulants: Secondary | ICD-10-CM | POA: Diagnosis not present

## 2016-09-23 DIAGNOSIS — I2510 Atherosclerotic heart disease of native coronary artery without angina pectoris: Secondary | ICD-10-CM | POA: Diagnosis not present

## 2016-09-23 DIAGNOSIS — N186 End stage renal disease: Secondary | ICD-10-CM | POA: Diagnosis not present

## 2016-09-23 DIAGNOSIS — E6609 Other obesity due to excess calories: Secondary | ICD-10-CM | POA: Diagnosis not present

## 2016-09-24 DIAGNOSIS — N186 End stage renal disease: Secondary | ICD-10-CM | POA: Diagnosis not present

## 2016-09-24 DIAGNOSIS — D631 Anemia in chronic kidney disease: Secondary | ICD-10-CM | POA: Diagnosis not present

## 2016-09-24 DIAGNOSIS — N2581 Secondary hyperparathyroidism of renal origin: Secondary | ICD-10-CM | POA: Diagnosis not present

## 2016-09-24 DIAGNOSIS — E1129 Type 2 diabetes mellitus with other diabetic kidney complication: Secondary | ICD-10-CM | POA: Diagnosis not present

## 2016-09-25 ENCOUNTER — Other Ambulatory Visit: Payer: Self-pay | Admitting: Vascular Surgery

## 2016-09-27 DIAGNOSIS — E1129 Type 2 diabetes mellitus with other diabetic kidney complication: Secondary | ICD-10-CM | POA: Diagnosis not present

## 2016-09-27 DIAGNOSIS — N186 End stage renal disease: Secondary | ICD-10-CM | POA: Diagnosis not present

## 2016-09-27 DIAGNOSIS — D631 Anemia in chronic kidney disease: Secondary | ICD-10-CM | POA: Diagnosis not present

## 2016-09-27 DIAGNOSIS — N2581 Secondary hyperparathyroidism of renal origin: Secondary | ICD-10-CM | POA: Diagnosis not present

## 2016-09-29 DIAGNOSIS — D631 Anemia in chronic kidney disease: Secondary | ICD-10-CM | POA: Diagnosis not present

## 2016-09-29 DIAGNOSIS — N186 End stage renal disease: Secondary | ICD-10-CM | POA: Diagnosis not present

## 2016-09-29 DIAGNOSIS — N2581 Secondary hyperparathyroidism of renal origin: Secondary | ICD-10-CM | POA: Diagnosis not present

## 2016-09-29 DIAGNOSIS — E1129 Type 2 diabetes mellitus with other diabetic kidney complication: Secondary | ICD-10-CM | POA: Diagnosis not present

## 2016-10-01 DIAGNOSIS — N2581 Secondary hyperparathyroidism of renal origin: Secondary | ICD-10-CM | POA: Diagnosis not present

## 2016-10-01 DIAGNOSIS — E1129 Type 2 diabetes mellitus with other diabetic kidney complication: Secondary | ICD-10-CM | POA: Diagnosis not present

## 2016-10-01 DIAGNOSIS — D631 Anemia in chronic kidney disease: Secondary | ICD-10-CM | POA: Diagnosis not present

## 2016-10-01 DIAGNOSIS — N186 End stage renal disease: Secondary | ICD-10-CM | POA: Diagnosis not present

## 2016-10-04 DIAGNOSIS — E1129 Type 2 diabetes mellitus with other diabetic kidney complication: Secondary | ICD-10-CM | POA: Diagnosis not present

## 2016-10-04 DIAGNOSIS — N2581 Secondary hyperparathyroidism of renal origin: Secondary | ICD-10-CM | POA: Diagnosis not present

## 2016-10-04 DIAGNOSIS — N186 End stage renal disease: Secondary | ICD-10-CM | POA: Diagnosis not present

## 2016-10-04 DIAGNOSIS — D631 Anemia in chronic kidney disease: Secondary | ICD-10-CM | POA: Diagnosis not present

## 2016-10-06 DIAGNOSIS — N2581 Secondary hyperparathyroidism of renal origin: Secondary | ICD-10-CM | POA: Diagnosis not present

## 2016-10-06 DIAGNOSIS — N186 End stage renal disease: Secondary | ICD-10-CM | POA: Diagnosis not present

## 2016-10-06 DIAGNOSIS — D631 Anemia in chronic kidney disease: Secondary | ICD-10-CM | POA: Diagnosis not present

## 2016-10-06 DIAGNOSIS — E1129 Type 2 diabetes mellitus with other diabetic kidney complication: Secondary | ICD-10-CM | POA: Diagnosis not present

## 2016-10-08 DIAGNOSIS — N186 End stage renal disease: Secondary | ICD-10-CM | POA: Diagnosis not present

## 2016-10-08 DIAGNOSIS — D631 Anemia in chronic kidney disease: Secondary | ICD-10-CM | POA: Diagnosis not present

## 2016-10-08 DIAGNOSIS — E1129 Type 2 diabetes mellitus with other diabetic kidney complication: Secondary | ICD-10-CM | POA: Diagnosis not present

## 2016-10-08 DIAGNOSIS — N2581 Secondary hyperparathyroidism of renal origin: Secondary | ICD-10-CM | POA: Diagnosis not present

## 2016-10-11 DIAGNOSIS — D631 Anemia in chronic kidney disease: Secondary | ICD-10-CM | POA: Diagnosis not present

## 2016-10-11 DIAGNOSIS — N186 End stage renal disease: Secondary | ICD-10-CM | POA: Diagnosis not present

## 2016-10-11 DIAGNOSIS — N2581 Secondary hyperparathyroidism of renal origin: Secondary | ICD-10-CM | POA: Diagnosis not present

## 2016-10-11 DIAGNOSIS — E1129 Type 2 diabetes mellitus with other diabetic kidney complication: Secondary | ICD-10-CM | POA: Diagnosis not present

## 2016-10-13 DIAGNOSIS — I4892 Unspecified atrial flutter: Secondary | ICD-10-CM | POA: Diagnosis not present

## 2016-10-13 DIAGNOSIS — D631 Anemia in chronic kidney disease: Secondary | ICD-10-CM | POA: Diagnosis not present

## 2016-10-13 DIAGNOSIS — E1129 Type 2 diabetes mellitus with other diabetic kidney complication: Secondary | ICD-10-CM | POA: Diagnosis not present

## 2016-10-13 DIAGNOSIS — N2581 Secondary hyperparathyroidism of renal origin: Secondary | ICD-10-CM | POA: Diagnosis not present

## 2016-10-13 DIAGNOSIS — N186 End stage renal disease: Secondary | ICD-10-CM | POA: Diagnosis not present

## 2016-10-15 DIAGNOSIS — N2581 Secondary hyperparathyroidism of renal origin: Secondary | ICD-10-CM | POA: Diagnosis not present

## 2016-10-15 DIAGNOSIS — N186 End stage renal disease: Secondary | ICD-10-CM | POA: Diagnosis not present

## 2016-10-15 DIAGNOSIS — E1129 Type 2 diabetes mellitus with other diabetic kidney complication: Secondary | ICD-10-CM | POA: Diagnosis not present

## 2016-10-15 DIAGNOSIS — D631 Anemia in chronic kidney disease: Secondary | ICD-10-CM | POA: Diagnosis not present

## 2016-10-18 DIAGNOSIS — D631 Anemia in chronic kidney disease: Secondary | ICD-10-CM | POA: Diagnosis not present

## 2016-10-18 DIAGNOSIS — N186 End stage renal disease: Secondary | ICD-10-CM | POA: Diagnosis not present

## 2016-10-18 DIAGNOSIS — E1129 Type 2 diabetes mellitus with other diabetic kidney complication: Secondary | ICD-10-CM | POA: Diagnosis not present

## 2016-10-18 DIAGNOSIS — N2581 Secondary hyperparathyroidism of renal origin: Secondary | ICD-10-CM | POA: Diagnosis not present

## 2016-10-20 DIAGNOSIS — N2581 Secondary hyperparathyroidism of renal origin: Secondary | ICD-10-CM | POA: Diagnosis not present

## 2016-10-20 DIAGNOSIS — E1129 Type 2 diabetes mellitus with other diabetic kidney complication: Secondary | ICD-10-CM | POA: Diagnosis not present

## 2016-10-20 DIAGNOSIS — D631 Anemia in chronic kidney disease: Secondary | ICD-10-CM | POA: Diagnosis not present

## 2016-10-20 DIAGNOSIS — N186 End stage renal disease: Secondary | ICD-10-CM | POA: Diagnosis not present

## 2016-10-21 DIAGNOSIS — I120 Hypertensive chronic kidney disease with stage 5 chronic kidney disease or end stage renal disease: Secondary | ICD-10-CM | POA: Diagnosis not present

## 2016-10-21 DIAGNOSIS — N186 End stage renal disease: Secondary | ICD-10-CM | POA: Diagnosis not present

## 2016-10-21 DIAGNOSIS — Z992 Dependence on renal dialysis: Secondary | ICD-10-CM | POA: Diagnosis not present

## 2016-10-22 DIAGNOSIS — N2581 Secondary hyperparathyroidism of renal origin: Secondary | ICD-10-CM | POA: Diagnosis not present

## 2016-10-22 DIAGNOSIS — N186 End stage renal disease: Secondary | ICD-10-CM | POA: Diagnosis not present

## 2016-10-22 DIAGNOSIS — D631 Anemia in chronic kidney disease: Secondary | ICD-10-CM | POA: Diagnosis not present

## 2016-10-25 DIAGNOSIS — N186 End stage renal disease: Secondary | ICD-10-CM | POA: Diagnosis not present

## 2016-10-25 DIAGNOSIS — D631 Anemia in chronic kidney disease: Secondary | ICD-10-CM | POA: Diagnosis not present

## 2016-10-25 DIAGNOSIS — N2581 Secondary hyperparathyroidism of renal origin: Secondary | ICD-10-CM | POA: Diagnosis not present

## 2016-10-27 DIAGNOSIS — N2581 Secondary hyperparathyroidism of renal origin: Secondary | ICD-10-CM | POA: Diagnosis not present

## 2016-10-27 DIAGNOSIS — D631 Anemia in chronic kidney disease: Secondary | ICD-10-CM | POA: Diagnosis not present

## 2016-10-27 DIAGNOSIS — N186 End stage renal disease: Secondary | ICD-10-CM | POA: Diagnosis not present

## 2016-10-28 DIAGNOSIS — I120 Hypertensive chronic kidney disease with stage 5 chronic kidney disease or end stage renal disease: Secondary | ICD-10-CM | POA: Diagnosis not present

## 2016-10-28 DIAGNOSIS — Z7901 Long term (current) use of anticoagulants: Secondary | ICD-10-CM | POA: Diagnosis not present

## 2016-10-28 DIAGNOSIS — E6609 Other obesity due to excess calories: Secondary | ICD-10-CM | POA: Diagnosis not present

## 2016-10-28 DIAGNOSIS — I5022 Chronic systolic (congestive) heart failure: Secondary | ICD-10-CM | POA: Diagnosis not present

## 2016-10-28 DIAGNOSIS — I2510 Atherosclerotic heart disease of native coronary artery without angina pectoris: Secondary | ICD-10-CM | POA: Diagnosis not present

## 2016-10-28 DIAGNOSIS — N186 End stage renal disease: Secondary | ICD-10-CM | POA: Diagnosis not present

## 2016-10-29 DIAGNOSIS — N2581 Secondary hyperparathyroidism of renal origin: Secondary | ICD-10-CM | POA: Diagnosis not present

## 2016-10-29 DIAGNOSIS — N186 End stage renal disease: Secondary | ICD-10-CM | POA: Diagnosis not present

## 2016-10-29 DIAGNOSIS — D631 Anemia in chronic kidney disease: Secondary | ICD-10-CM | POA: Diagnosis not present

## 2016-11-01 DIAGNOSIS — N2581 Secondary hyperparathyroidism of renal origin: Secondary | ICD-10-CM | POA: Diagnosis not present

## 2016-11-01 DIAGNOSIS — D631 Anemia in chronic kidney disease: Secondary | ICD-10-CM | POA: Diagnosis not present

## 2016-11-01 DIAGNOSIS — N186 End stage renal disease: Secondary | ICD-10-CM | POA: Diagnosis not present

## 2016-11-03 DIAGNOSIS — N186 End stage renal disease: Secondary | ICD-10-CM | POA: Diagnosis not present

## 2016-11-03 DIAGNOSIS — N2581 Secondary hyperparathyroidism of renal origin: Secondary | ICD-10-CM | POA: Diagnosis not present

## 2016-11-03 DIAGNOSIS — D631 Anemia in chronic kidney disease: Secondary | ICD-10-CM | POA: Diagnosis not present

## 2016-11-05 DIAGNOSIS — N2581 Secondary hyperparathyroidism of renal origin: Secondary | ICD-10-CM | POA: Diagnosis not present

## 2016-11-05 DIAGNOSIS — D631 Anemia in chronic kidney disease: Secondary | ICD-10-CM | POA: Diagnosis not present

## 2016-11-05 DIAGNOSIS — N186 End stage renal disease: Secondary | ICD-10-CM | POA: Diagnosis not present

## 2016-11-08 DIAGNOSIS — N186 End stage renal disease: Secondary | ICD-10-CM | POA: Diagnosis not present

## 2016-11-08 DIAGNOSIS — D631 Anemia in chronic kidney disease: Secondary | ICD-10-CM | POA: Diagnosis not present

## 2016-11-08 DIAGNOSIS — N2581 Secondary hyperparathyroidism of renal origin: Secondary | ICD-10-CM | POA: Diagnosis not present

## 2016-11-10 DIAGNOSIS — N186 End stage renal disease: Secondary | ICD-10-CM | POA: Diagnosis not present

## 2016-11-10 DIAGNOSIS — N2581 Secondary hyperparathyroidism of renal origin: Secondary | ICD-10-CM | POA: Diagnosis not present

## 2016-11-10 DIAGNOSIS — D631 Anemia in chronic kidney disease: Secondary | ICD-10-CM | POA: Diagnosis not present

## 2016-11-12 DIAGNOSIS — N2581 Secondary hyperparathyroidism of renal origin: Secondary | ICD-10-CM | POA: Diagnosis not present

## 2016-11-12 DIAGNOSIS — N186 End stage renal disease: Secondary | ICD-10-CM | POA: Diagnosis not present

## 2016-11-12 DIAGNOSIS — D631 Anemia in chronic kidney disease: Secondary | ICD-10-CM | POA: Diagnosis not present

## 2016-11-15 DIAGNOSIS — N186 End stage renal disease: Secondary | ICD-10-CM | POA: Diagnosis not present

## 2016-11-15 DIAGNOSIS — D631 Anemia in chronic kidney disease: Secondary | ICD-10-CM | POA: Diagnosis not present

## 2016-11-15 DIAGNOSIS — N2581 Secondary hyperparathyroidism of renal origin: Secondary | ICD-10-CM | POA: Diagnosis not present

## 2016-11-17 DIAGNOSIS — N186 End stage renal disease: Secondary | ICD-10-CM | POA: Diagnosis not present

## 2016-11-17 DIAGNOSIS — D631 Anemia in chronic kidney disease: Secondary | ICD-10-CM | POA: Diagnosis not present

## 2016-11-17 DIAGNOSIS — N2581 Secondary hyperparathyroidism of renal origin: Secondary | ICD-10-CM | POA: Diagnosis not present

## 2016-11-17 DIAGNOSIS — I4892 Unspecified atrial flutter: Secondary | ICD-10-CM | POA: Diagnosis not present

## 2016-11-19 DIAGNOSIS — D631 Anemia in chronic kidney disease: Secondary | ICD-10-CM | POA: Diagnosis not present

## 2016-11-19 DIAGNOSIS — N2581 Secondary hyperparathyroidism of renal origin: Secondary | ICD-10-CM | POA: Diagnosis not present

## 2016-11-19 DIAGNOSIS — N186 End stage renal disease: Secondary | ICD-10-CM | POA: Diagnosis not present

## 2016-11-20 DIAGNOSIS — Z992 Dependence on renal dialysis: Secondary | ICD-10-CM | POA: Diagnosis not present

## 2016-11-20 DIAGNOSIS — N186 End stage renal disease: Secondary | ICD-10-CM | POA: Diagnosis not present

## 2016-11-20 DIAGNOSIS — I120 Hypertensive chronic kidney disease with stage 5 chronic kidney disease or end stage renal disease: Secondary | ICD-10-CM | POA: Diagnosis not present

## 2016-11-22 DIAGNOSIS — N186 End stage renal disease: Secondary | ICD-10-CM | POA: Diagnosis not present

## 2016-11-22 DIAGNOSIS — D631 Anemia in chronic kidney disease: Secondary | ICD-10-CM | POA: Diagnosis not present

## 2016-11-22 DIAGNOSIS — E1129 Type 2 diabetes mellitus with other diabetic kidney complication: Secondary | ICD-10-CM | POA: Diagnosis not present

## 2016-11-22 DIAGNOSIS — N2581 Secondary hyperparathyroidism of renal origin: Secondary | ICD-10-CM | POA: Diagnosis not present

## 2016-11-24 DIAGNOSIS — D631 Anemia in chronic kidney disease: Secondary | ICD-10-CM | POA: Diagnosis not present

## 2016-11-24 DIAGNOSIS — E1129 Type 2 diabetes mellitus with other diabetic kidney complication: Secondary | ICD-10-CM | POA: Diagnosis not present

## 2016-11-24 DIAGNOSIS — N2581 Secondary hyperparathyroidism of renal origin: Secondary | ICD-10-CM | POA: Diagnosis not present

## 2016-11-24 DIAGNOSIS — N186 End stage renal disease: Secondary | ICD-10-CM | POA: Diagnosis not present

## 2016-11-26 DIAGNOSIS — N2581 Secondary hyperparathyroidism of renal origin: Secondary | ICD-10-CM | POA: Diagnosis not present

## 2016-11-26 DIAGNOSIS — N186 End stage renal disease: Secondary | ICD-10-CM | POA: Diagnosis not present

## 2016-11-26 DIAGNOSIS — D631 Anemia in chronic kidney disease: Secondary | ICD-10-CM | POA: Diagnosis not present

## 2016-11-26 DIAGNOSIS — E1129 Type 2 diabetes mellitus with other diabetic kidney complication: Secondary | ICD-10-CM | POA: Diagnosis not present

## 2016-11-29 DIAGNOSIS — E1129 Type 2 diabetes mellitus with other diabetic kidney complication: Secondary | ICD-10-CM | POA: Diagnosis not present

## 2016-11-29 DIAGNOSIS — N2581 Secondary hyperparathyroidism of renal origin: Secondary | ICD-10-CM | POA: Diagnosis not present

## 2016-11-29 DIAGNOSIS — D631 Anemia in chronic kidney disease: Secondary | ICD-10-CM | POA: Diagnosis not present

## 2016-11-29 DIAGNOSIS — N186 End stage renal disease: Secondary | ICD-10-CM | POA: Diagnosis not present

## 2016-12-01 DIAGNOSIS — E1129 Type 2 diabetes mellitus with other diabetic kidney complication: Secondary | ICD-10-CM | POA: Diagnosis not present

## 2016-12-01 DIAGNOSIS — N2581 Secondary hyperparathyroidism of renal origin: Secondary | ICD-10-CM | POA: Diagnosis not present

## 2016-12-01 DIAGNOSIS — N186 End stage renal disease: Secondary | ICD-10-CM | POA: Diagnosis not present

## 2016-12-01 DIAGNOSIS — D631 Anemia in chronic kidney disease: Secondary | ICD-10-CM | POA: Diagnosis not present

## 2016-12-02 DIAGNOSIS — Z7901 Long term (current) use of anticoagulants: Secondary | ICD-10-CM | POA: Diagnosis not present

## 2016-12-02 DIAGNOSIS — E6609 Other obesity due to excess calories: Secondary | ICD-10-CM | POA: Diagnosis not present

## 2016-12-02 DIAGNOSIS — I120 Hypertensive chronic kidney disease with stage 5 chronic kidney disease or end stage renal disease: Secondary | ICD-10-CM | POA: Diagnosis not present

## 2016-12-02 DIAGNOSIS — R42 Dizziness and giddiness: Secondary | ICD-10-CM | POA: Diagnosis not present

## 2016-12-02 DIAGNOSIS — N186 End stage renal disease: Secondary | ICD-10-CM | POA: Diagnosis not present

## 2016-12-03 DIAGNOSIS — N186 End stage renal disease: Secondary | ICD-10-CM | POA: Diagnosis not present

## 2016-12-03 DIAGNOSIS — D631 Anemia in chronic kidney disease: Secondary | ICD-10-CM | POA: Diagnosis not present

## 2016-12-03 DIAGNOSIS — N2581 Secondary hyperparathyroidism of renal origin: Secondary | ICD-10-CM | POA: Diagnosis not present

## 2016-12-03 DIAGNOSIS — E1129 Type 2 diabetes mellitus with other diabetic kidney complication: Secondary | ICD-10-CM | POA: Diagnosis not present

## 2016-12-06 DIAGNOSIS — D631 Anemia in chronic kidney disease: Secondary | ICD-10-CM | POA: Diagnosis not present

## 2016-12-06 DIAGNOSIS — N186 End stage renal disease: Secondary | ICD-10-CM | POA: Diagnosis not present

## 2016-12-06 DIAGNOSIS — N2581 Secondary hyperparathyroidism of renal origin: Secondary | ICD-10-CM | POA: Diagnosis not present

## 2016-12-06 DIAGNOSIS — E1129 Type 2 diabetes mellitus with other diabetic kidney complication: Secondary | ICD-10-CM | POA: Diagnosis not present

## 2016-12-07 DIAGNOSIS — R1011 Right upper quadrant pain: Secondary | ICD-10-CM | POA: Diagnosis not present

## 2016-12-08 DIAGNOSIS — N2581 Secondary hyperparathyroidism of renal origin: Secondary | ICD-10-CM | POA: Diagnosis not present

## 2016-12-08 DIAGNOSIS — N186 End stage renal disease: Secondary | ICD-10-CM | POA: Diagnosis not present

## 2016-12-08 DIAGNOSIS — D631 Anemia in chronic kidney disease: Secondary | ICD-10-CM | POA: Diagnosis not present

## 2016-12-08 DIAGNOSIS — E1129 Type 2 diabetes mellitus with other diabetic kidney complication: Secondary | ICD-10-CM | POA: Diagnosis not present

## 2016-12-10 DIAGNOSIS — D631 Anemia in chronic kidney disease: Secondary | ICD-10-CM | POA: Diagnosis not present

## 2016-12-10 DIAGNOSIS — N186 End stage renal disease: Secondary | ICD-10-CM | POA: Diagnosis not present

## 2016-12-10 DIAGNOSIS — N2581 Secondary hyperparathyroidism of renal origin: Secondary | ICD-10-CM | POA: Diagnosis not present

## 2016-12-10 DIAGNOSIS — E1129 Type 2 diabetes mellitus with other diabetic kidney complication: Secondary | ICD-10-CM | POA: Diagnosis not present

## 2016-12-13 DIAGNOSIS — E1129 Type 2 diabetes mellitus with other diabetic kidney complication: Secondary | ICD-10-CM | POA: Diagnosis not present

## 2016-12-13 DIAGNOSIS — N2581 Secondary hyperparathyroidism of renal origin: Secondary | ICD-10-CM | POA: Diagnosis not present

## 2016-12-13 DIAGNOSIS — D631 Anemia in chronic kidney disease: Secondary | ICD-10-CM | POA: Diagnosis not present

## 2016-12-13 DIAGNOSIS — N186 End stage renal disease: Secondary | ICD-10-CM | POA: Diagnosis not present

## 2016-12-15 DIAGNOSIS — I4892 Unspecified atrial flutter: Secondary | ICD-10-CM | POA: Diagnosis not present

## 2016-12-15 DIAGNOSIS — N2581 Secondary hyperparathyroidism of renal origin: Secondary | ICD-10-CM | POA: Diagnosis not present

## 2016-12-15 DIAGNOSIS — D631 Anemia in chronic kidney disease: Secondary | ICD-10-CM | POA: Diagnosis not present

## 2016-12-15 DIAGNOSIS — N186 End stage renal disease: Secondary | ICD-10-CM | POA: Diagnosis not present

## 2016-12-15 DIAGNOSIS — E1129 Type 2 diabetes mellitus with other diabetic kidney complication: Secondary | ICD-10-CM | POA: Diagnosis not present

## 2016-12-16 ENCOUNTER — Other Ambulatory Visit: Payer: Self-pay | Admitting: Gastroenterology

## 2016-12-16 DIAGNOSIS — R112 Nausea with vomiting, unspecified: Secondary | ICD-10-CM | POA: Diagnosis not present

## 2016-12-16 DIAGNOSIS — K219 Gastro-esophageal reflux disease without esophagitis: Secondary | ICD-10-CM | POA: Diagnosis not present

## 2016-12-16 DIAGNOSIS — R1033 Periumbilical pain: Secondary | ICD-10-CM

## 2016-12-16 DIAGNOSIS — K760 Fatty (change of) liver, not elsewhere classified: Secondary | ICD-10-CM | POA: Diagnosis not present

## 2016-12-16 DIAGNOSIS — K5730 Diverticulosis of large intestine without perforation or abscess without bleeding: Secondary | ICD-10-CM | POA: Diagnosis not present

## 2016-12-16 NOTE — Progress Notes (Signed)
Matthew Coster MD 

## 2016-12-17 ENCOUNTER — Ambulatory Visit
Admission: RE | Admit: 2016-12-17 | Discharge: 2016-12-17 | Disposition: A | Discharge status: Home / Self Care | Payer: Medicare Other | Source: Ambulatory Visit | Attending: Gastroenterology | Admitting: Gastroenterology

## 2016-12-17 ENCOUNTER — Other Ambulatory Visit: Payer: Self-pay | Admitting: Gastroenterology

## 2016-12-17 DIAGNOSIS — R112 Nausea with vomiting, unspecified: Secondary | ICD-10-CM

## 2016-12-17 DIAGNOSIS — R1033 Periumbilical pain: Secondary | ICD-10-CM

## 2016-12-17 DIAGNOSIS — E1129 Type 2 diabetes mellitus with other diabetic kidney complication: Secondary | ICD-10-CM | POA: Diagnosis not present

## 2016-12-17 DIAGNOSIS — N289 Disorder of kidney and ureter, unspecified: Secondary | ICD-10-CM | POA: Diagnosis not present

## 2016-12-17 DIAGNOSIS — N2581 Secondary hyperparathyroidism of renal origin: Secondary | ICD-10-CM | POA: Diagnosis not present

## 2016-12-17 DIAGNOSIS — N186 End stage renal disease: Secondary | ICD-10-CM | POA: Diagnosis not present

## 2016-12-17 DIAGNOSIS — D631 Anemia in chronic kidney disease: Secondary | ICD-10-CM | POA: Diagnosis not present

## 2016-12-17 NOTE — Progress Notes (Signed)
Matthew Pint MD 

## 2016-12-20 DIAGNOSIS — D631 Anemia in chronic kidney disease: Secondary | ICD-10-CM | POA: Diagnosis not present

## 2016-12-20 DIAGNOSIS — E1129 Type 2 diabetes mellitus with other diabetic kidney complication: Secondary | ICD-10-CM | POA: Diagnosis not present

## 2016-12-20 DIAGNOSIS — N186 End stage renal disease: Secondary | ICD-10-CM | POA: Diagnosis not present

## 2016-12-20 DIAGNOSIS — N2581 Secondary hyperparathyroidism of renal origin: Secondary | ICD-10-CM | POA: Diagnosis not present

## 2016-12-21 DIAGNOSIS — Z992 Dependence on renal dialysis: Secondary | ICD-10-CM | POA: Diagnosis not present

## 2016-12-21 DIAGNOSIS — N186 End stage renal disease: Secondary | ICD-10-CM | POA: Diagnosis not present

## 2016-12-21 DIAGNOSIS — I120 Hypertensive chronic kidney disease with stage 5 chronic kidney disease or end stage renal disease: Secondary | ICD-10-CM | POA: Diagnosis not present

## 2016-12-22 DIAGNOSIS — N2581 Secondary hyperparathyroidism of renal origin: Secondary | ICD-10-CM | POA: Diagnosis not present

## 2016-12-22 DIAGNOSIS — N186 End stage renal disease: Secondary | ICD-10-CM | POA: Diagnosis not present

## 2016-12-22 DIAGNOSIS — E1129 Type 2 diabetes mellitus with other diabetic kidney complication: Secondary | ICD-10-CM | POA: Diagnosis not present

## 2016-12-24 DIAGNOSIS — N186 End stage renal disease: Secondary | ICD-10-CM | POA: Diagnosis not present

## 2016-12-24 DIAGNOSIS — E1129 Type 2 diabetes mellitus with other diabetic kidney complication: Secondary | ICD-10-CM | POA: Diagnosis not present

## 2016-12-24 DIAGNOSIS — N2581 Secondary hyperparathyroidism of renal origin: Secondary | ICD-10-CM | POA: Diagnosis not present

## 2016-12-27 DIAGNOSIS — E1129 Type 2 diabetes mellitus with other diabetic kidney complication: Secondary | ICD-10-CM | POA: Diagnosis not present

## 2016-12-27 DIAGNOSIS — N186 End stage renal disease: Secondary | ICD-10-CM | POA: Diagnosis not present

## 2016-12-27 DIAGNOSIS — N2581 Secondary hyperparathyroidism of renal origin: Secondary | ICD-10-CM | POA: Diagnosis not present

## 2016-12-29 DIAGNOSIS — E1129 Type 2 diabetes mellitus with other diabetic kidney complication: Secondary | ICD-10-CM | POA: Diagnosis not present

## 2016-12-29 DIAGNOSIS — N2581 Secondary hyperparathyroidism of renal origin: Secondary | ICD-10-CM | POA: Diagnosis not present

## 2016-12-29 DIAGNOSIS — N186 End stage renal disease: Secondary | ICD-10-CM | POA: Diagnosis not present

## 2016-12-31 DIAGNOSIS — N186 End stage renal disease: Secondary | ICD-10-CM | POA: Diagnosis not present

## 2016-12-31 DIAGNOSIS — E1129 Type 2 diabetes mellitus with other diabetic kidney complication: Secondary | ICD-10-CM | POA: Diagnosis not present

## 2016-12-31 DIAGNOSIS — N2581 Secondary hyperparathyroidism of renal origin: Secondary | ICD-10-CM | POA: Diagnosis not present

## 2017-01-03 DIAGNOSIS — E1129 Type 2 diabetes mellitus with other diabetic kidney complication: Secondary | ICD-10-CM | POA: Diagnosis not present

## 2017-01-03 DIAGNOSIS — N186 End stage renal disease: Secondary | ICD-10-CM | POA: Diagnosis not present

## 2017-01-03 DIAGNOSIS — N2581 Secondary hyperparathyroidism of renal origin: Secondary | ICD-10-CM | POA: Diagnosis not present

## 2017-01-04 DIAGNOSIS — R42 Dizziness and giddiness: Secondary | ICD-10-CM | POA: Diagnosis not present

## 2017-01-04 DIAGNOSIS — Z7901 Long term (current) use of anticoagulants: Secondary | ICD-10-CM | POA: Diagnosis not present

## 2017-01-04 DIAGNOSIS — E6609 Other obesity due to excess calories: Secondary | ICD-10-CM | POA: Diagnosis not present

## 2017-01-04 DIAGNOSIS — N186 End stage renal disease: Secondary | ICD-10-CM | POA: Diagnosis not present

## 2017-01-04 DIAGNOSIS — I120 Hypertensive chronic kidney disease with stage 5 chronic kidney disease or end stage renal disease: Secondary | ICD-10-CM | POA: Diagnosis not present

## 2017-01-05 DIAGNOSIS — N2581 Secondary hyperparathyroidism of renal origin: Secondary | ICD-10-CM | POA: Diagnosis not present

## 2017-01-05 DIAGNOSIS — N186 End stage renal disease: Secondary | ICD-10-CM | POA: Diagnosis not present

## 2017-01-05 DIAGNOSIS — E1129 Type 2 diabetes mellitus with other diabetic kidney complication: Secondary | ICD-10-CM | POA: Diagnosis not present

## 2017-01-06 DIAGNOSIS — K219 Gastro-esophageal reflux disease without esophagitis: Secondary | ICD-10-CM | POA: Diagnosis not present

## 2017-01-06 DIAGNOSIS — Z1211 Encounter for screening for malignant neoplasm of colon: Secondary | ICD-10-CM | POA: Diagnosis not present

## 2017-01-06 DIAGNOSIS — R1033 Periumbilical pain: Secondary | ICD-10-CM | POA: Diagnosis not present

## 2017-01-06 DIAGNOSIS — R194 Change in bowel habit: Secondary | ICD-10-CM | POA: Diagnosis not present

## 2017-01-06 DIAGNOSIS — K760 Fatty (change of) liver, not elsewhere classified: Secondary | ICD-10-CM | POA: Diagnosis not present

## 2017-01-07 DIAGNOSIS — N2581 Secondary hyperparathyroidism of renal origin: Secondary | ICD-10-CM | POA: Diagnosis not present

## 2017-01-07 DIAGNOSIS — E1129 Type 2 diabetes mellitus with other diabetic kidney complication: Secondary | ICD-10-CM | POA: Diagnosis not present

## 2017-01-07 DIAGNOSIS — N186 End stage renal disease: Secondary | ICD-10-CM | POA: Diagnosis not present

## 2017-01-10 DIAGNOSIS — N2581 Secondary hyperparathyroidism of renal origin: Secondary | ICD-10-CM | POA: Diagnosis not present

## 2017-01-10 DIAGNOSIS — E1129 Type 2 diabetes mellitus with other diabetic kidney complication: Secondary | ICD-10-CM | POA: Diagnosis not present

## 2017-01-10 DIAGNOSIS — N186 End stage renal disease: Secondary | ICD-10-CM | POA: Diagnosis not present

## 2017-01-11 DIAGNOSIS — N186 End stage renal disease: Secondary | ICD-10-CM | POA: Diagnosis not present

## 2017-01-11 DIAGNOSIS — R42 Dizziness and giddiness: Secondary | ICD-10-CM | POA: Diagnosis not present

## 2017-01-11 DIAGNOSIS — Z7901 Long term (current) use of anticoagulants: Secondary | ICD-10-CM | POA: Diagnosis not present

## 2017-01-11 DIAGNOSIS — I120 Hypertensive chronic kidney disease with stage 5 chronic kidney disease or end stage renal disease: Secondary | ICD-10-CM | POA: Diagnosis not present

## 2017-01-11 DIAGNOSIS — E6609 Other obesity due to excess calories: Secondary | ICD-10-CM | POA: Diagnosis not present

## 2017-01-12 DIAGNOSIS — E1129 Type 2 diabetes mellitus with other diabetic kidney complication: Secondary | ICD-10-CM | POA: Diagnosis not present

## 2017-01-12 DIAGNOSIS — N186 End stage renal disease: Secondary | ICD-10-CM | POA: Diagnosis not present

## 2017-01-12 DIAGNOSIS — I4892 Unspecified atrial flutter: Secondary | ICD-10-CM | POA: Diagnosis not present

## 2017-01-12 DIAGNOSIS — N2581 Secondary hyperparathyroidism of renal origin: Secondary | ICD-10-CM | POA: Diagnosis not present

## 2017-01-14 DIAGNOSIS — N186 End stage renal disease: Secondary | ICD-10-CM | POA: Diagnosis not present

## 2017-01-14 DIAGNOSIS — N2581 Secondary hyperparathyroidism of renal origin: Secondary | ICD-10-CM | POA: Diagnosis not present

## 2017-01-14 DIAGNOSIS — E1129 Type 2 diabetes mellitus with other diabetic kidney complication: Secondary | ICD-10-CM | POA: Diagnosis not present

## 2017-01-17 DIAGNOSIS — N186 End stage renal disease: Secondary | ICD-10-CM | POA: Diagnosis not present

## 2017-01-17 DIAGNOSIS — E1129 Type 2 diabetes mellitus with other diabetic kidney complication: Secondary | ICD-10-CM | POA: Diagnosis not present

## 2017-01-17 DIAGNOSIS — N2581 Secondary hyperparathyroidism of renal origin: Secondary | ICD-10-CM | POA: Diagnosis not present

## 2017-01-19 DIAGNOSIS — E1129 Type 2 diabetes mellitus with other diabetic kidney complication: Secondary | ICD-10-CM | POA: Diagnosis not present

## 2017-01-19 DIAGNOSIS — N186 End stage renal disease: Secondary | ICD-10-CM | POA: Diagnosis not present

## 2017-01-19 DIAGNOSIS — N2581 Secondary hyperparathyroidism of renal origin: Secondary | ICD-10-CM | POA: Diagnosis not present

## 2017-01-21 DIAGNOSIS — N186 End stage renal disease: Secondary | ICD-10-CM | POA: Diagnosis not present

## 2017-01-21 DIAGNOSIS — Z992 Dependence on renal dialysis: Secondary | ICD-10-CM | POA: Diagnosis not present

## 2017-01-21 DIAGNOSIS — I120 Hypertensive chronic kidney disease with stage 5 chronic kidney disease or end stage renal disease: Secondary | ICD-10-CM | POA: Diagnosis not present

## 2017-01-21 DIAGNOSIS — N2581 Secondary hyperparathyroidism of renal origin: Secondary | ICD-10-CM | POA: Diagnosis not present

## 2017-01-21 DIAGNOSIS — E1129 Type 2 diabetes mellitus with other diabetic kidney complication: Secondary | ICD-10-CM | POA: Diagnosis not present

## 2017-01-24 DIAGNOSIS — N186 End stage renal disease: Secondary | ICD-10-CM | POA: Diagnosis not present

## 2017-01-24 DIAGNOSIS — N2581 Secondary hyperparathyroidism of renal origin: Secondary | ICD-10-CM | POA: Diagnosis not present

## 2017-01-24 DIAGNOSIS — E1129 Type 2 diabetes mellitus with other diabetic kidney complication: Secondary | ICD-10-CM | POA: Diagnosis not present

## 2017-01-26 DIAGNOSIS — E1129 Type 2 diabetes mellitus with other diabetic kidney complication: Secondary | ICD-10-CM | POA: Diagnosis not present

## 2017-01-26 DIAGNOSIS — N186 End stage renal disease: Secondary | ICD-10-CM | POA: Diagnosis not present

## 2017-01-26 DIAGNOSIS — N2581 Secondary hyperparathyroidism of renal origin: Secondary | ICD-10-CM | POA: Diagnosis not present

## 2017-01-27 ENCOUNTER — Other Ambulatory Visit: Payer: Self-pay | Admitting: Gastroenterology

## 2017-01-27 NOTE — Progress Notes (Signed)
Matthew Cifelli MD 

## 2017-01-28 DIAGNOSIS — N186 End stage renal disease: Secondary | ICD-10-CM | POA: Diagnosis not present

## 2017-01-28 DIAGNOSIS — E1129 Type 2 diabetes mellitus with other diabetic kidney complication: Secondary | ICD-10-CM | POA: Diagnosis not present

## 2017-01-28 DIAGNOSIS — N2581 Secondary hyperparathyroidism of renal origin: Secondary | ICD-10-CM | POA: Diagnosis not present

## 2017-01-30 ENCOUNTER — Encounter (HOSPITAL_COMMUNITY): Payer: Self-pay | Admitting: Emergency Medicine

## 2017-01-30 ENCOUNTER — Ambulatory Visit (HOSPITAL_COMMUNITY)
Admission: EM | Admit: 2017-01-30 | Discharge: 2017-01-30 | Disposition: A | Discharge status: Home / Self Care | Payer: Medicare Other | Attending: Family Medicine | Admitting: Family Medicine

## 2017-01-30 DIAGNOSIS — L089 Local infection of the skin and subcutaneous tissue, unspecified: Secondary | ICD-10-CM

## 2017-01-30 MED ORDER — CEPHALEXIN 500 MG PO CAPS: 500.0000 mg | ORAL_CAPSULE | Freq: Three times a day (TID) | ORAL | 0 refills | Status: AC

## 2017-01-30 NOTE — ED Provider Notes (Signed)
[place]    CSN: [id number] Arrival date & time: 01/30/17  1235     History   Chief Complaint Chief Complaint  Patient presents with  . Hand Pain    HPI [name] is a 56 y.o. male.   HPI  3 days of worsening pain and swelling in R middle finger distally. Hurts most near nail. He has not used anything at home. No drainage, fevers, spreading redness, injury.   Past Medical History:  Diagnosis Date  . Atrial flutter ([place])   . 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 Cardiomyopathy   . CHF (congestive heart failure) ([place])   . Coronary artery disease    normal coronaries by 10/10/08 cath, Cardiac Cath 08-04-12 epic.Dr. Doylene Canard follows  . Diabetes mellitus    pt. states he's borderline diabetic., no longer taking med,- off med. since 2013  . Dialysis patient [facility])    Mon-Wed-Fri([place])- Left AV fistula  . Diverticulitis November 2016   reoccurred in December 2016  . ESRD (end stage renal disease) ([place])   . GERD (gastroesophageal reflux disease)   . Gout   . History of nephrectomy 07/04/2012   History of right nephrectomy in 2002 for renal cell carcinoma   . History of unilateral nephrectomy   . Hypertension   . Low iron   . Myocardial infarction [facility]) ?2006  . Renal cell carcinoma    dialysis- MWF- Dr. Mercy Moore follows.  . Renal insufficiency   . Shortness of breath 05/19/11   "at rest, lying down, w/exertion"  . Stroke [facility]) 02/2011   05/19/11 denies residual  . Umbilical hernia 62/70/35   unrepaired    Patient Active Problem List   Diagnosis Date Noted  . Bile leak, postoperative 03/15/2016  . Elevated troponin 03/15/2016  . Rib pain on right side   . Biliary dyskinesia 02/12/2016  . Atrial flutter with rapid ventricular response ([place]) 06/27/2015  . Diverticulitis 04/21/2015  . Acute diverticulitis 04/21/2015  . Diverticulitis of intestine without perforation or abscess without bleeding 04/21/2015  .  Pulmonary edema 01/13/2015  . Right upper quadrant abdominal pain 01/13/2015  . Acute respiratory failure with hypoxia ([place]) 01/13/2015  . Diverticulitis large intestine w/o perforation or abscess w/o bleeding 12/18/2014  . Hepatic cyst 12/18/2014  . Aftercare following surgery of the circulatory system, [place] 06/22/2013  . ESRD (end stage renal disease) on dialysis ([place]) 11/28/2012  . Right sided weakness 10/05/2012  . History of nephrectomy 07/04/2012  . End-stage renal disease on hemodialysis ([place]) 06/04/2011  . Systolic CHF, chronic ([place]) 05/19/2011    Class: Acute  . Hypertension, benign 05/19/2011    Class: Chronic  . DM II (diabetes mellitus, type II), controlled ([place]) 05/19/2011    Class: Chronic  . Gout, chronic 05/19/2011    Past Surgical History:  Procedure Laterality Date  . AV FISTULA PLACEMENT  03/29/2011   Procedure: ARTERIOVENOUS (AV) FISTULA CREATION;  Surgeon: Hinda Lenis, MD;  Location: [place];  Service: Vascular;  Laterality: Left;  LEFT RADIOCEPHALIC , Arteriovenous [id number])  . CARDIAC CATHETERIZATION  05/21/11  . CARDIAC CATHETERIZATION N/A 03/16/2016   Procedure: Left Heart Cath and Coronary Angiography;  Surgeon: Dixie Dials, MD;  Location: [place] CV LAB;  Service: Cardiovascular;  Laterality: N/A;  . CHOLECYSTECTOMY N/A 02/12/2016   Procedure: LAPAROSCOPIC CHOLECYSTECTOMY WITH INTRAOPERATIVE CHOLANGIOGRAM;  Surgeon: Jackolyn Confer, MD;  Location: [place];  Service: General;  Laterality: N/A;  .  dialysis cath placed    . ESOPHAGOGASTRODUODENOSCOPY (EGD) WITH PROPOFOL N/A 12/02/2015   Procedure: ESOPHAGOGASTRODUODENOSCOPY (EGD) WITH PROPOFOL;  Surgeon: Juanita Craver, MD;  Location: [facility];  Service: Endoscopy;  Laterality: N/A;  . FINGER SURGERY     L pinkie finger- ORIF- /w remaining hardware - 1990's    . HERNIA REPAIR N/A 9/38/1829   Umbilical hernia repair  . INSERTION OF DIALYSIS CATHETER N/A 01/27/2016   Procedure: INSERTION OF DIALYSIS  CATHETER;  Surgeon: Waynetta Sandy, MD;  Location: [place];  Service: Vascular;  Laterality: N/A;  . INSERTION OF MESH N/A 07/04/2012   Procedure: INSERTION OF MESH;  Surgeon: Madilyn Hook, DO;  Location: [place];  Service: General;  Laterality: N/A;  . LAPAROSCOPIC LYSIS OF ADHESIONS N/A 02/12/2016   Procedure: LAPAROSCOPIC LYSIS OF ADHESIONS;  Surgeon: Jackolyn Confer, MD;  Location: [place];  Service: General;  Laterality: N/A;  . LEFT AND RIGHT HEART CATHETERIZATION WITH CORONARY ANGIOGRAM N/A 08/04/2012   Procedure: LEFT AND RIGHT HEART CATHETERIZATION WITH CORONARY ANGIOGRAM;  Surgeon: Birdie Riddle, MD;  Location: [place] CATH LAB;  Service: Cardiovascular;  Laterality: N/A;  . NEPHRECTOMY  2000   right  . REVISON OF ARTERIOVENOUS FISTULA Left 06/05/2013   Procedure: REVISON OF LEFT RADIOCEPHALIC  ARTERIOVENOUS FISTULA;  Surgeon: Conrad Butte, MD;  Location: [place];  Service: Vascular;  Laterality: Left;  . REVISON OF ARTERIOVENOUS FISTULA Left 01/27/2016   Procedure: REVISION OF LEFT UPPER EXTREMITY ARTERIOVENOUS FISTULA;  Surgeon: Waynetta Sandy, MD;  Location: [place];  Service: Vascular;  Laterality: Left;  . REVISON OF ARTERIOVENOUS FISTULA Left 08/03/2016   Procedure: REVISON OF Left arm ARTERIOVENOUS FISTULA;  Surgeon: Elam Dutch, MD;  Location: [place];  Service: Vascular;  Laterality: Left;  . RIGHT HEART CATHETERIZATION N/A 05/21/2011   Procedure: RIGHT HEART CATH;  Surgeon: Birdie Riddle, MD;  Location: [facility] CATH LAB;  Service: Cardiovascular;  Laterality: N/A;  . smashed  1990's   "left pinky; have a plate in there"  . UMBILICAL HERNIA REPAIR N/A 07/04/2012   Procedure: LAPAROSCOPIC UMBILICAL HERNIA;  Surgeon: Madilyn Hook, DO;  Location: [place];  Service: General;  Laterality: N/A;  . US ECHOCARDIOGRAPHY  05/20/11       Home Medications    Prior to Admission medications   Medication Sig Start Date End Date Taking? Authorizing Provider  allopurinol (ZYLOPRIM) 300 MG  tablet Take 300 mg by mouth daily.  12/07/15   [provider]  amiodarone (PACERONE) 200 MG tablet Take 0.5 tablets (100 mg total) by mouth daily. 07/05/15   Dixie Dials, MD  amLODipine (NORVASC) 5 MG tablet Take 5 mg by mouth daily.     [provider]  carvedilol (COREG) 3.125 MG tablet Take 1 tablet (3.125 mg total) by mouth 2 (two) times daily with a meal. 03/21/16   Sheikh, Omair Latif, DO  cephALEXin (KEFLEX) 500 MG capsule Take 1 capsule (500 mg total) by mouth 3 (three) times daily. 01/30/17 02/09/17  Shelda Pal, DO  chlorhexidine (PERIDEX) 0.12 % solution RINSE WITH 1/2 OZ TWICE A DAY (WITH ORAL HYGIENE ROUTINE) 06/25/16   [provider]  cholecalciferol (VITAMIN D) 1000 units tablet Take 1,000 Units by mouth daily.    [provider]  cinacalcet (SENSIPAR) 90 MG tablet Take 90 mg by mouth daily with supper.     [provider]  isosorbide dinitrate (ISORDIL) 20 MG tablet Take 20 mg by mouth daily.  [provider]  lidocaine-prilocaine (EMLA) cream Apply 1 application topically every Monday, Wednesday, and Friday. Apply to port access prior to dialysis 04/08/15   [provider]  midodrine (PROAMATINE) 10 MG tablet Take 1 tablet (10 mg total) by mouth 3 (three) times daily with meals. 08/03/16   Alvia Grove, PA-C  multivitamin (RENA-VIT) TABS tablet Take 1 tablet by mouth at bedtime. 03/21/16   Raiford Noble Latif, DO  nitroGLYCERIN (NITROSTAT) 0.4 MG SL tablet Place 0.4 mg under the tongue every 5 (five) minutes as needed for chest pain.  05/20/15   [provider]  Nutritional Supplements (FEEDING SUPPLEMENT, NEPRO CARB STEADY,) LIQD Take 237 mLs by mouth 2 (two) times daily between meals. Patient taking differently: Take 237 mLs by mouth every other day.  03/21/16   Sheikh, Omair Latif, DO  pantoprazole (PROTONIX) 40 MG tablet Take 1 tablet (40 mg total) by mouth 2 (two) times daily. 01/27/16   Alvia Grove, PA-C  sevelamer carbonate (RENVELA) 800 MG tablet Take 2-3 tablets (1,600-2,400 mg total) by mouth See admin instructions. 1600 mg with snacks and 2400 mg with meals 08/03/16   Virgina Jock A, PA-C  warfarin (COUMADIN) 6 MG tablet Take 2 tablets (12 mg total) by mouth daily at 6 PM. 08/04/16   Alvia Grove, PA-C    Family History Family History  Problem Relation Age of Onset  . Hypertension Mother   . Kidney disease Mother     Social History Social History  Substance Use Topics  . Smoking status: Never Smoker  . Smokeless tobacco: Never Used  . Alcohol use No     Allergies   Ace inhibitors   Review of Systems Review of Systems  Constitutional: Negative for fever.  Skin:       +redness over R middle finger     Physical Exam Triage Vital Signs ED Triage Vitals  Enc Vitals Group     BP 01/30/17 1409 125/79     Pulse Rate 01/30/17 1409 83     Resp 01/30/17 1409 20     Temp 01/30/17 1409 97.6 F (36.4 C)     Temp Source 01/30/17 1409 Oral     SpO2 01/30/17 1409 100 %     Pain Score 01/30/17 1408 10   Updated Vital Signs BP 125/79 (BP Location: Right Arm) Comment (BP Location): large cuff  Pulse 83   Temp 97.6 F (36.4 C) (Oral)   Resp 20   SpO2 100%   Physical Exam  Constitutional: He appears well-developed and well-nourished.  HENT:  Head: Normocephalic and atraumatic.  Pulmonary/Chest: Effort normal. No respiratory distress.  Neurological: He is alert. No sensory deficit.  Skin: Capillary refill takes less than 2 seconds.  R finger pad, 3rd digit is warm, TTP, edematous, mildly erythematous, decreased ROM of DIP; no fluctuance, drainage, streaking redness  Psychiatric: He has a normal mood and affect. Judgment normal.     UC Treatments / Results  Procedures Procedures none  Initial Impression / Assessment and Plan / UC Course  I have reviewed the triage vital signs and the nursing notes.  Pertinent labs & imaging results that  were available during my care of the patient were reviewed by me and considered in my medical decision making (see chart for details).     56 yo male presents with R finger pain, atraumatic. Given warmth, swelling and pain, will tx for bacterial infection. Ice. Activity as tolerated. I do not appreciate any  fluctuance so felon less likely. No systemic involvement appreciated. I would like him to f/u with his PCP tomorrow to make sure he is progressing well. If things worsen, ER or call 911. While lower on the differential, if there develops involvement of the flexor tendon, he will require IV abx. Given the numerous areas he is tender, I do not believe that is the case right now. F/u with PCP tomorrow. The pt voiced understanding and agreement to the plan.  Final Clinical Impressions(s) / UC Diagnoses   Final diagnoses:  Finger infection    New Prescriptions New Prescriptions   CEPHALEXIN (KEFLEX) 500 MG CAPSULE    Take 1 capsule (500 mg total) by mouth 3 (three) times daily.     Controlled Substance Prescriptions [place] Controlled Substance Registry consulted? Not Applicable   Shelda Pal, [place] 01/30/17 1446

## 2017-01-30 NOTE — Discharge Instructions (Signed)
If things worsen before tomorrow, go to ER.  Try to get an appointment with Dr. Berdine Addison tomorrow to see how things are going.   Things to look out for (call 911 or go to ER): fevers, worsening symptoms, spreading redness/pain, foul odor.

## 2017-01-30 NOTE — ED Triage Notes (Signed)
Right finger pain, tenderness.  No known injury.  Initially right middle finger tip was tingling, now painful and swollen

## 2017-01-31 DIAGNOSIS — E1129 Type 2 diabetes mellitus with other diabetic kidney complication: Secondary | ICD-10-CM | POA: Diagnosis not present

## 2017-01-31 DIAGNOSIS — N186 End stage renal disease: Secondary | ICD-10-CM | POA: Diagnosis not present

## 2017-01-31 DIAGNOSIS — N2581 Secondary hyperparathyroidism of renal origin: Secondary | ICD-10-CM | POA: Diagnosis not present

## 2017-02-01 ENCOUNTER — Ambulatory Visit (HOSPITAL_COMMUNITY): Payer: Medicare Other | Admitting: Anesthesiology

## 2017-02-01 ENCOUNTER — Ambulatory Visit (HOSPITAL_COMMUNITY)
Admission: RE | Admit: 2017-02-01 | Discharge: 2017-02-01 | Disposition: A | Discharge status: Home / Self Care | Payer: Medicare Other | Source: Ambulatory Visit | Attending: Gastroenterology | Admitting: Gastroenterology

## 2017-02-01 ENCOUNTER — Encounter (HOSPITAL_COMMUNITY)
Admission: RE | Disposition: A | Discharge status: Home / Self Care | Payer: Self-pay | Source: Ambulatory Visit | Attending: Gastroenterology

## 2017-02-01 ENCOUNTER — Encounter (HOSPITAL_COMMUNITY): Payer: Self-pay | Admitting: Anesthesiology

## 2017-02-01 DIAGNOSIS — K635 Polyp of colon: Secondary | ICD-10-CM | POA: Insufficient documentation

## 2017-02-01 DIAGNOSIS — I509 Heart failure, unspecified: Secondary | ICD-10-CM | POA: Diagnosis not present

## 2017-02-01 DIAGNOSIS — I4892 Unspecified atrial flutter: Secondary | ICD-10-CM

## 2017-02-01 DIAGNOSIS — Z1211 Encounter for screening for malignant neoplasm of colon: Secondary | ICD-10-CM | POA: Insufficient documentation

## 2017-02-01 DIAGNOSIS — K5730 Diverticulosis of large intestine without perforation or abscess without bleeding: Secondary | ICD-10-CM | POA: Diagnosis not present

## 2017-02-01 DIAGNOSIS — D120 Benign neoplasm of cecum: Secondary | ICD-10-CM | POA: Diagnosis not present

## 2017-02-01 DIAGNOSIS — E119 Type 2 diabetes mellitus without complications: Secondary | ICD-10-CM

## 2017-02-01 DIAGNOSIS — I2510 Atherosclerotic heart disease of native coronary artery without angina pectoris: Secondary | ICD-10-CM | POA: Diagnosis not present

## 2017-02-01 DIAGNOSIS — Z79899 Other long term (current) drug therapy: Secondary | ICD-10-CM | POA: Insufficient documentation

## 2017-02-01 DIAGNOSIS — E1122 Type 2 diabetes mellitus with diabetic chronic kidney disease: Secondary | ICD-10-CM | POA: Insufficient documentation

## 2017-02-01 DIAGNOSIS — I252 Old myocardial infarction: Secondary | ICD-10-CM | POA: Diagnosis not present

## 2017-02-01 DIAGNOSIS — I132 Hypertensive heart and chronic kidney disease with heart failure and with stage 5 chronic kidney disease, or end stage renal disease: Secondary | ICD-10-CM | POA: Diagnosis not present

## 2017-02-01 DIAGNOSIS — Z7901 Long term (current) use of anticoagulants: Secondary | ICD-10-CM | POA: Insufficient documentation

## 2017-02-01 DIAGNOSIS — Z992 Dependence on renal dialysis: Secondary | ICD-10-CM

## 2017-02-01 DIAGNOSIS — N186 End stage renal disease: Secondary | ICD-10-CM

## 2017-02-01 DIAGNOSIS — K219 Gastro-esophageal reflux disease without esophagitis: Secondary | ICD-10-CM | POA: Insufficient documentation

## 2017-02-01 DIAGNOSIS — M109 Gout, unspecified: Secondary | ICD-10-CM | POA: Insufficient documentation

## 2017-02-01 DIAGNOSIS — Z8673 Personal history of transient ischemic attack (TIA), and cerebral infarction without residual deficits: Secondary | ICD-10-CM | POA: Diagnosis not present

## 2017-02-01 DIAGNOSIS — I5022 Chronic systolic (congestive) heart failure: Secondary | ICD-10-CM

## 2017-02-01 DIAGNOSIS — Z85528 Personal history of other malignant neoplasm of kidney: Secondary | ICD-10-CM | POA: Diagnosis not present

## 2017-02-01 DIAGNOSIS — J9601 Acute respiratory failure with hypoxia: Secondary | ICD-10-CM

## 2017-02-01 DIAGNOSIS — D125 Benign neoplasm of sigmoid colon: Secondary | ICD-10-CM | POA: Diagnosis not present

## 2017-02-01 HISTORY — PX: COLONOSCOPY WITH PROPOFOL: SHX5780

## 2017-02-01 LAB — POCT I-STAT 4, (NA,K, GLUC, HGB,HCT)
Glucose, Bld: 82 mg/dL (ref 65–99)
HCT: 38 % — ABNORMAL LOW (ref 39.0–52.0)
Hemoglobin: 12.9 g/dL — ABNORMAL LOW (ref 13.0–17.0)
Potassium: 4.2 mmol/L (ref 3.5–5.1)
Sodium: 139 mmol/L (ref 135–145)

## 2017-02-01 LAB — GLUCOSE, CAPILLARY: Glucose-Capillary: 82 mg/dL (ref 65–99)

## 2017-02-01 SURGERY — COLONOSCOPY WITH PROPOFOL
Anesthesia: Monitor Anesthesia Care

## 2017-02-01 MED ORDER — LIDOCAINE 2% (20 MG/ML) 5 ML SYRINGE
INTRAMUSCULAR | Status: AC
  Filled 2017-02-01: qty 5

## 2017-02-01 MED ORDER — SODIUM CHLORIDE 0.9 % IV SOLN
INTRAVENOUS | Status: DC
  Administered 2017-02-01: 07:00:00 via INTRAVENOUS

## 2017-02-01 MED ORDER — FENTANYL CITRATE (PF) 100 MCG/2ML IJ SOLN
INTRAMUSCULAR | Status: AC
  Filled 2017-02-01: qty 2

## 2017-02-01 MED ORDER — PROPOFOL 10 MG/ML IV BOLUS
INTRAVENOUS | Status: DC | PRN
Start: 2017-02-01 — End: 2017-02-01
  Administered 2017-02-01 (×2): 10 mg via INTRAVENOUS
  Administered 2017-02-01: 20 mg via INTRAVENOUS
  Administered 2017-02-01: 10 mg via INTRAVENOUS
  Administered 2017-02-01 (×2): 20 mg via INTRAVENOUS

## 2017-02-01 MED ORDER — LACTATED RINGERS IV SOLN: INTRAVENOUS | Status: DC

## 2017-02-01 MED ORDER — MIDAZOLAM HCL 2 MG/2ML IJ SOLN
INTRAMUSCULAR | Status: AC
  Filled 2017-02-01: qty 2

## 2017-02-01 MED ORDER — DEXAMETHASONE SODIUM PHOSPHATE 4 MG/ML IJ SOLN
INTRAMUSCULAR | Status: DC | PRN
  Administered 2017-02-01: 4 mg via INTRAVENOUS

## 2017-02-01 MED ORDER — PROPOFOL 10 MG/ML IV BOLUS
INTRAVENOUS | Status: AC
  Filled 2017-02-01: qty 40

## 2017-02-01 MED ORDER — DEXAMETHASONE SODIUM PHOSPHATE 10 MG/ML IJ SOLN
INTRAMUSCULAR | Status: AC
Start: 2017-02-01 — End: ?
  Filled 2017-02-01: qty 1

## 2017-02-01 MED ORDER — ONDANSETRON HCL 4 MG/2ML IJ SOLN
INTRAMUSCULAR | Status: DC | PRN
  Administered 2017-02-01: 4 mg via INTRAVENOUS

## 2017-02-01 MED ORDER — LIDOCAINE HCL (PF) 2 % IJ SOLN
INTRAMUSCULAR | Status: DC | PRN
  Administered 2017-02-01: 50 mg via INTRADERMAL

## 2017-02-01 MED ORDER — ONDANSETRON HCL 4 MG/2ML IJ SOLN
INTRAMUSCULAR | Status: AC
  Filled 2017-02-01: qty 2

## 2017-02-01 SURGICAL SUPPLY — 22 items

## 2017-02-01 NOTE — H&P (Signed)
[name] is an 56 y.o. male.   Chief Complaint: Colorectal cancer screening. HPI: 56 year old black male here for CRC screening. See office notes for details,  Past Medical History:  Diagnosis Date  . Atrial flutter ([place])   . 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 Cardiomyopathy   . CHF (congestive heart failure) ([place])   . Coronary artery disease    normal coronaries by 10/10/08 cath, Cardiac Cath 08-04-12 epic.Dr. Doylene Canard follows  . Diabetes mellitus    pt. states he's borderline diabetic., no longer taking med,- off med. since 2013  . Dialysis patient [facility])    Mon-Wed-Fri([place])- Left AV fistula  . Diverticulitis November 2016   reoccurred in December 2016  . ESRD (end stage renal disease) ([place])   . GERD (gastroesophageal reflux disease)   . Gout   . History of nephrectomy 07/04/2012   History of right nephrectomy in 2002 for renal cell carcinoma   . History of unilateral nephrectomy   . Hypertension   . Low iron   . Myocardial infarction [facility]) ?2006  . Renal cell carcinoma    dialysis- MWF- Dr. Mercy Moore follows.  . Renal insufficiency   . Shortness of breath 05/19/11   "at rest, lying down, w/exertion"  . Stroke [facility]) 02/2011   05/19/11 denies residual  . Umbilical hernia 44/81/85   unrepaired   Past Surgical History:  Procedure Laterality Date  . AV FISTULA PLACEMENT  03/29/2011   Procedure: ARTERIOVENOUS (AV) FISTULA CREATION;  Surgeon: Hinda Lenis, MD;  Location: [place];  Service: Vascular;  Laterality: Left;  LEFT RADIOCEPHALIC , Arteriovenous [id number])  . CARDIAC CATHETERIZATION  05/21/11  . CARDIAC CATHETERIZATION N/A 03/16/2016   Procedure: Left Heart Cath and Coronary Angiography;  Surgeon: Dixie Dials, MD;  Location: [place] CV LAB;  Service: Cardiovascular;  Laterality: N/A;  . CHOLECYSTECTOMY N/A 02/12/2016   Procedure: LAPAROSCOPIC CHOLECYSTECTOMY WITH INTRAOPERATIVE CHOLANGIOGRAM;  Surgeon: Jackolyn Confer, MD;  Location: [place];  Service: General;  Laterality: N/A;  . dialysis cath placed    . ESOPHAGOGASTRODUODENOSCOPY (EGD) WITH PROPOFOL N/A 12/02/2015   Procedure: ESOPHAGOGASTRODUODENOSCOPY (EGD) WITH PROPOFOL;  Surgeon: Juanita Craver, MD;  Location: [facility];  Service: Endoscopy;  Laterality: N/A;  . FINGER SURGERY     L pinkie finger- ORIF- /w remaining hardware - 1990's    . HERNIA REPAIR N/A 5/88/5027   Umbilical hernia repair  . INSERTION OF DIALYSIS CATHETER N/A 01/27/2016   Procedure: INSERTION OF DIALYSIS CATHETER;  Surgeon: Waynetta Sandy, MD;  Location: [place];  Service: Vascular;  Laterality: N/A;  . INSERTION OF MESH N/A 07/04/2012   Procedure: INSERTION OF MESH;  Surgeon: Madilyn Hook, DO;  Location: [place];  Service: General;  Laterality: N/A;  . LAPAROSCOPIC LYSIS OF ADHESIONS N/A 02/12/2016   Procedure: LAPAROSCOPIC LYSIS OF ADHESIONS;  Surgeon: Jackolyn Confer, MD;  Location: [place];  Service: General;  Laterality: N/A;  . LEFT AND RIGHT HEART CATHETERIZATION WITH CORONARY ANGIOGRAM N/A 08/04/2012   Procedure: LEFT AND RIGHT HEART CATHETERIZATION WITH CORONARY ANGIOGRAM;  Surgeon: Birdie Riddle, MD;  Location: [place] CATH LAB;  Service: Cardiovascular;  Laterality: N/A;  . NEPHRECTOMY  2000   right  . REVISON OF ARTERIOVENOUS FISTULA Left 06/05/2013   Procedure: REVISON OF LEFT RADIOCEPHALIC  ARTERIOVENOUS FISTULA;  Surgeon: Conrad Fayette, MD;  Location: [place];  Service: Vascular;  Laterality: Left;  . REVISON OF ARTERIOVENOUS FISTULA Left  01/27/2016   Procedure: REVISION OF LEFT UPPER EXTREMITY ARTERIOVENOUS FISTULA;  Surgeon: Waynetta Sandy, MD;  Location: [place];  Service: Vascular;  Laterality: Left;  . REVISON OF ARTERIOVENOUS FISTULA Left 08/03/2016   Procedure: REVISON OF Left arm ARTERIOVENOUS FISTULA;  Surgeon: Elam Dutch, MD;  Location: [place];  Service: Vascular;  Laterality: Left;  . RIGHT HEART CATHETERIZATION N/A 05/21/2011   Procedure:  RIGHT HEART CATH;  Surgeon: Birdie Riddle, MD;  Location: [facility] CATH LAB;  Service: Cardiovascular;  Laterality: N/A;  . smashed  1990's   "left pinky; have a plate in there"  . UMBILICAL HERNIA REPAIR N/A 07/04/2012   Procedure: LAPAROSCOPIC UMBILICAL HERNIA;  Surgeon: Madilyn Hook, DO;  Location: [place];  Service: General;  Laterality: N/A;  . US ECHOCARDIOGRAPHY  05/20/11   Family History  Problem Relation Age of Onset  . Hypertension Mother   . Kidney disease Mother    Social History:  reports that he has never smoked. He has never used smokeless tobacco. He reports that he does not drink alcohol or use drugs.  Allergies:  Allergies  Allergen Reactions  . Ace Inhibitors Itching and Cough    COUGH > INTOLERANCE   Medications Prior to Admission  Medication Sig Dispense Refill  . allopurinol (ZYLOPRIM) 300 MG tablet Take 300 mg by mouth daily.   6  . amiodarone (PACERONE) 200 MG tablet Take 0.5 tablets (100 mg total) by mouth daily. 15 tablet 6  . amLODipine (NORVASC) 5 MG tablet Take 5 mg by mouth daily.     . carvedilol (COREG) 3.125 MG tablet Take 1 tablet (3.125 mg total) by mouth 2 (two) times daily with a meal. 60 tablet 0  . cephALEXin (KEFLEX) 500 MG capsule Take 1 capsule (500 mg total) by mouth 3 (three) times daily. 30 capsule 0  . chlorhexidine (PERIDEX) 0.12 % solution RINSE WITH 1/2 OZ TWICE A DAY (WITH ORAL HYGIENE ROUTINE)  12  . cholecalciferol (VITAMIN D) 1000 units tablet Take 1,000 Units by mouth daily.    . cinacalcet (SENSIPAR) 90 MG tablet Take 90 mg by mouth daily with supper.     . isosorbide dinitrate (ISORDIL) 20 MG tablet Take 20 mg by mouth daily.     Marland Kitchen lidocaine-prilocaine (EMLA) cream Apply 1 application topically every Monday, Wednesday, and Friday. Apply to port access prior to dialysis  12  . midodrine (PROAMATINE) 10 MG tablet Take 1 tablet (10 mg total) by mouth 3 (three) times daily with meals. 90 tablet 1  . multivitamin (RENA-VIT) TABS tablet Take  1 tablet by mouth at bedtime. 30 tablet 0  . nitroGLYCERIN (NITROSTAT) 0.4 MG SL tablet Place 0.4 mg under the tongue every 5 (five) minutes as needed for chest pain.   0  . Nutritional Supplements (FEEDING SUPPLEMENT, NEPRO CARB STEADY,) LIQD Take 237 mLs by mouth 2 (two) times daily between meals. (Patient taking differently: Take 237 mLs by mouth every other day. ) 1 Can 0  . pantoprazole (PROTONIX) 40 MG tablet Take 1 tablet (40 mg total) by mouth 2 (two) times daily. 60 tablet 1  . sevelamer carbonate (RENVELA) 800 MG tablet Take 2-3 tablets (1,600-2,400 mg total) by mouth See admin instructions. 1600 mg with snacks and 2400 mg with meals    . warfarin (COUMADIN) 6 MG tablet Take 2 tablets (12 mg total) by mouth daily at 6 PM.      Results for orders placed or performed during the hospital encounter  of 02/01/17 (from the past 48 hour(s))  I-STAT 4, (NA,K, GLUC, HGB,HCT)     Status: Abnormal   Collection Time: 02/01/17  7:15 AM  Result Value Ref Range   Sodium 139 135 - 145 mmol/L   Potassium 4.2 3.5 - 5.1 mmol/L   Glucose, Bld 82 65 - 99 mg/dL   HCT 38.0 (L) 39.0 - 52.0 %   Hemoglobin 12.9 (L) 13.0 - 17.0 g/dL  Glucose, capillary     Status: None   Collection Time: 02/01/17  7:17 AM  Result Value Ref Range   Glucose-Capillary 82 65 - 99 mg/dL   No results found.  Review of Systems  Constitutional: Negative.   HENT: Negative.   Eyes: Negative.   Respiratory: Negative.   Cardiovascular: Negative.   Gastrointestinal: Positive for abdominal pain and heartburn. Negative for blood in stool, constipation, diarrhea, melena, nausea and vomiting.  Genitourinary: Positive for dysuria.  Musculoskeletal: Negative.   Skin: Negative.   Neurological: Negative.   Endo/Heme/Allergies: Negative.   Psychiatric/Behavioral: Negative.     Blood pressure 116/78, pulse 98, temperature (!) 97.5 F (36.4 C), temperature source Oral, resp. rate 20, height 6\' 1"  (1.854 m), weight 109.8 kg (242 lb),  SpO2 99 %. Physical Exam  Constitutional: He is oriented to person, place, and time. He appears well-developed and well-nourished.  HENT:  Head: Normocephalic and atraumatic.  Eyes: Pupils are equal, round, and reactive to light. Conjunctivae and EOM are normal.  Neck: Normal range of motion. Neck supple.  Cardiovascular: Normal rate and regular rhythm.   Respiratory: Effort normal and breath sounds normal.  GI: Soft. Bowel sounds are normal.  Musculoskeletal: Normal range of motion.  Neurological: He is alert and oriented to person, place, and time.  Skin: Skin is warm and dry.  Psychiatric: He has a normal mood and affect. His behavior is normal. Judgment and thought content normal.    Assessment/Plan Colorectal cancer screening: proceed with a colonoscopy at this time.  Elhadji Pecore, MD 02/01/2017, 7:31 AM

## 2017-02-01 NOTE — Anesthesia Preprocedure Evaluation (Signed)
Anesthesia Evaluation  Patient identified by MRN, date of birth, ID band Patient awake    Reviewed: Allergy & Precautions, H&P , NPO status , Patient's Chart, lab work & pertinent test results  Airway Mallampati: II   Neck ROM: full    Dental   Pulmonary shortness of breath,    breath sounds clear to auscultation       Cardiovascular hypertension, + CAD and +CHF  + dysrhythmias Atrial Fibrillation  Rhythm:regular Rate:Normal     Neuro/Psych CVA    GI/Hepatic GERD  ,  Endo/Other  diabetes, Type 2  Renal/GU ESRF and DialysisRenal diseaseH/o nephrectomy for RCC     Musculoskeletal   Abdominal   Peds  Hematology   Anesthesia Other Findings   Reproductive/Obstetrics                             Anesthesia Physical Anesthesia Plan  ASA: IV  Anesthesia Plan: MAC   Post-op Pain Management:    Induction: Intravenous  PONV Risk Score and Plan: 1 and Ondansetron, Dexamethasone, Propofol infusion and Treatment may vary due to age or medical condition  Airway Management Planned: Simple Face Mask  Additional Equipment:   Intra-op Plan:   Post-operative Plan:   Informed Consent: I have reviewed the patients History and Physical, chart, labs and discussed the procedure including the risks, benefits and alternatives for the proposed anesthesia with the patient or authorized representative who has indicated his/her understanding and acceptance.     Plan Discussed with: CRNA, Anesthesiologist and Surgeon  Anesthesia Plan Comments:         Anesthesia Quick Evaluation

## 2017-02-01 NOTE — Transfer of Care (Signed)
Immediate Anesthesia Transfer of Care Note  Patient: [name]  Procedure(s) Performed: Procedure(s): COLONOSCOPY WITH PROPOFOL (N/A)  Patient Location: PACU and Endoscopy Unit  Anesthesia Type:MAC  Level of Consciousness: awake, alert  and oriented  Airway & Oxygen Therapy: Patient Spontanous Breathing and Patient connected to face mask oxygen  Post-op Assessment: Report given to RN and Post -op Vital signs reviewed and stable  Post vital signs: Reviewed  Last Vitals:  Vitals:   02/01/17 0703 02/01/17 0813  BP: 116/78 (!) 87/50  Pulse: 98 89  Resp: 20 (!) 24  Temp: (!) 36.4 C 36.8 C  SpO2: 99% 100%    Last Pain:  Vitals:   02/01/17 0813  TempSrc: Oral         Complications: No apparent anesthesia complications

## 2017-02-01 NOTE — Op Note (Signed)
[facility] Patient Name: [name] Procedure Date: 02/01/2017 MRN: [id number] Attending MD: Juanita Craver , MD Date of Birth: 07/14/1960 CSN: [id number] Age: 56 Admit Type: Outpatient Procedure:                Colonoscopy with cold biopsies x 3 & hot snare                            polypectomy x 1. Indications:              CRC screening for colorectal malignant neoplasm. Providers:                Juanita Craver, MD, Angus Seller, RN, Elspeth Cho                            Technician, Karis Juba, CRNA. Referring MD:             Dixie Dials, MD & Barrie Folk. Hill MD Medicines:                Monitored Anesthesia Care Complications:            No immediate complications. Estimated Blood Loss:     Estimated blood loss: none. Estimated blood loss                            was minimal. Procedure:                Pre-anesthesia assessment: - Prior to the                            procedure, a history and physical was performed,                            and patient medications and allergies were                            reviewed. The patient's tolerance of previous                            anesthesia was also reviewed. The risks and                            benefits of the procedure and the sedation options                            and risks were discussed with the patient. All                            questions were answered, and informed consent was                            obtained. Prior anticoagulants: The patient has                            taken Coumadin (Warfarin), last dose was 5 days  prior to procedure. ASA Grade assessment: IV - A                            patient with severe systemic disease that is a                            constant threat to life. After reviewing the risks                            and benefits, the patient was deemed in                            satisfactory condition to undergo the  procedure.                            After obtaining informed consent, the colonoscope                            was passed under direct vision. Throughout the                            procedure, the patient's blood pressure, pulse, and                            oxygen saturations were monitored continuously. The                            EC-[id number] ([id number]) scope was introduced through                            the anus and advanced to the the cecum, identified                            by appendiceal orifice and ileocecal valve. The                            colonoscopy was performed with moderate difficulty                            due to inadequate bowel prep. Successful completion                            of the procedure was aided by lavage. The patient                            tolerated the procedure. The quality of the bowel                            preparation was adequate. The ileocecal valve, the                            appendiceal orifice and the rectum were  photographed. The bowel preparation used was                            GoLYTELY. Scope In: 7:47:27 AM Scope Out: 8:03:30 AM Scope Withdrawal Time: 0 hours 13 minutes 11 seconds  Total Procedure Duration: 0 hours 16 minutes 3 seconds  Findings:      A few small-mouthed diverticula were found in the entire colon.      A 8 mm pedunculated polyp was found in the sigmoid colon at 50 cm; the       polyp was removed with a hot snare x 1-200/20; resection and retrieval       were complete.      A small sessile polyp was found in the cecum. Biopsies were taken with a       cold forceps for histology.      No additional abnormalities were found on retroflexion. Impression:               - Few small scattered diverticula in the entire                            examined colon.                           - One 8 mm polyp in the sigmoid colon, removed with                            a  hot snare x 1; resected and retrieved.                           - One small sessile polyp in the cecum-removed by                            cold biopsies x 3. Moderate Sedation:      MAC used. Recommendation:           - High fiber diet with augmented water consumption                            daily.                           - Continue present medications.                           - Await pathology results.                           - Resume anticoagulant at prior dose in 10 days;                            refer to Dr. Doylene Canard for further adjustment of                            therapy.                           - Return to my  office PRN.                           - Repeat colonoscopy in 5-10 years for surveillance. Procedure Code(s):        --- Professional ---                           [phone], Colonoscopy, flexible; with removal of                            tumor(s), polyp(s), or other lesion(s) by snare                            technique Diagnosis Code(s):        --- Professional ---                           D12.0, Benign neoplasm of cecum                           D12.5, Benign neoplasm of sigmoid colon                           K57.30, Diverticulosis of large intestine without                            perforation or abscess without bleeding                           Z12.11, Encounter for screening for malignant                            neoplasm of colon CPT copyright 2016 [employer]. All rights reserved. The codes documented in this report are preliminary and upon coder review may  be revised to meet current compliance requirements. Juanita Craver, MD Juanita Craver, MD 02/01/2017 8:24:32 AM This report has been signed electronically. Number of Addenda: 0

## 2017-02-01 NOTE — Anesthesia Postprocedure Evaluation (Signed)
Anesthesia Post Note  Patient: [name]  Procedure(s) Performed: Procedure(s) (LRB): COLONOSCOPY WITH PROPOFOL (N/A)     Patient location during evaluation: PACU Anesthesia Type: MAC Level of consciousness: awake and alert Pain management: pain level controlled Vital Signs Assessment: post-procedure vital signs reviewed and stable Respiratory status: spontaneous breathing, nonlabored ventilation, respiratory function stable and patient connected to nasal cannula oxygen Cardiovascular status: stable and blood pressure returned to baseline Anesthetic complications: no    Last Vitals:  Vitals:   02/01/17 0825 02/01/17 0830  BP: 115/70 113/73  Pulse: 82 79  Resp: 19 20  Temp:    SpO2: 100% 100%    Last Pain:  Vitals:   02/01/17 0813  TempSrc: Oral                 Quamir Willemsen S

## 2017-02-01 NOTE — Discharge Instructions (Signed)

## 2017-02-02 DIAGNOSIS — N186 End stage renal disease: Secondary | ICD-10-CM | POA: Diagnosis not present

## 2017-02-02 DIAGNOSIS — E1129 Type 2 diabetes mellitus with other diabetic kidney complication: Secondary | ICD-10-CM | POA: Diagnosis not present

## 2017-02-02 DIAGNOSIS — N2581 Secondary hyperparathyroidism of renal origin: Secondary | ICD-10-CM | POA: Diagnosis not present

## 2017-02-03 ENCOUNTER — Emergency Department (HOSPITAL_COMMUNITY): Payer: Medicare Other

## 2017-02-03 ENCOUNTER — Encounter (HOSPITAL_COMMUNITY): Payer: Self-pay

## 2017-02-03 ENCOUNTER — Emergency Department (HOSPITAL_COMMUNITY)
Admission: EM | Admit: 2017-02-03 | Discharge: 2017-02-03 | Disposition: A | Discharge status: Home / Self Care | Payer: Medicare Other | Attending: Emergency Medicine | Admitting: Emergency Medicine

## 2017-02-03 DIAGNOSIS — N186 End stage renal disease: Secondary | ICD-10-CM | POA: Insufficient documentation

## 2017-02-03 DIAGNOSIS — I132 Hypertensive heart and chronic kidney disease with heart failure and with stage 5 chronic kidney disease, or end stage renal disease: Secondary | ICD-10-CM | POA: Insufficient documentation

## 2017-02-03 DIAGNOSIS — E1122 Type 2 diabetes mellitus with diabetic chronic kidney disease: Secondary | ICD-10-CM | POA: Insufficient documentation

## 2017-02-03 DIAGNOSIS — Z79899 Other long term (current) drug therapy: Secondary | ICD-10-CM | POA: Insufficient documentation

## 2017-02-03 DIAGNOSIS — I252 Old myocardial infarction: Secondary | ICD-10-CM | POA: Insufficient documentation

## 2017-02-03 DIAGNOSIS — Z992 Dependence on renal dialysis: Secondary | ICD-10-CM | POA: Insufficient documentation

## 2017-02-03 DIAGNOSIS — L089 Local infection of the skin and subcutaneous tissue, unspecified: Secondary | ICD-10-CM | POA: Diagnosis not present

## 2017-02-03 DIAGNOSIS — Z8673 Personal history of transient ischemic attack (TIA), and cerebral infarction without residual deficits: Secondary | ICD-10-CM | POA: Diagnosis not present

## 2017-02-03 DIAGNOSIS — Z85528 Personal history of other malignant neoplasm of kidney: Secondary | ICD-10-CM | POA: Insufficient documentation

## 2017-02-03 DIAGNOSIS — I2510 Atherosclerotic heart disease of native coronary artery without angina pectoris: Secondary | ICD-10-CM | POA: Diagnosis not present

## 2017-02-03 DIAGNOSIS — R2231 Localized swelling, mass and lump, right upper limb: Secondary | ICD-10-CM | POA: Diagnosis present

## 2017-02-03 DIAGNOSIS — I5020 Unspecified systolic (congestive) heart failure: Secondary | ICD-10-CM | POA: Insufficient documentation

## 2017-02-03 DIAGNOSIS — L03011 Cellulitis of right finger: Secondary | ICD-10-CM | POA: Diagnosis not present

## 2017-02-03 MED ORDER — SULFAMETHOXAZOLE-TRIMETHOPRIM 800-160 MG PO TABS: 1.0000 | ORAL_TABLET | Freq: Two times a day (BID) | ORAL | 0 refills | Status: AC

## 2017-02-03 MED ORDER — LIDOCAINE-EPINEPHRINE (PF) 2 %-1:200000 IJ SOLN
10.0000 mL | Freq: Once | INTRAMUSCULAR | Status: AC
  Administered 2017-02-03: 10 mL
  Filled 2017-02-03: qty 20

## 2017-02-03 NOTE — ED Triage Notes (Signed)
Patient complains of 1 week of right hand middle finger swelling and pain. Seen at [facility] for same and taking antibiotic with no relief. Swelling around nailbed

## 2017-02-03 NOTE — Discharge Instructions (Signed)
Take antibiotics as prescribed. Continue taking Keflex, and add on Bactrim. Use Tylenol as needed for pain. Keep the dressing on for 24 hours. After this, you may remove the dressing, wash gently with soap and water, and reapply dressing. Change dressing every day. You will continue to have stuff draining from the incision, it will likely be red with some infectious material. It should be lessening each day. Follow-up with your primary care doctor if you have return of symptoms including swelling and pain. Return to the emergency room if you develop fever, chills, nausea, vomiting, spreading redness tender finger, or any new or worsening symptoms.

## 2017-02-03 NOTE — ED Provider Notes (Signed)
[facility] DEPT Provider Note   CSN: [id number] Arrival date & time: 02/03/17  0753     History   Chief Complaint No chief complaint on file.   HPI [name] is a 56 y.o. male presetning with apin and swelling of R middle finger x1 wk.   Pt states that 1 week ago, he first noticed pain and swelling. Sxs have persisted and worsened over the week. He was evaluated in UC on Monday, and started on keflex. Sxs have not improved with abx. He denies fevers, chills, nausea, vomiting, or abd pain. Denies numbness or tingling. He denies injury to the finger. He is not on blood thinners, and he has had a tetanus shot within the past 10 years. He denies drainage from the area. He has not tried anything to make his pain better. Movement and palpation make it worse. PMH includes DM and gout.    HPI  Past Medical History:  Diagnosis Date  . Atrial flutter ([place])   . 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 Cardiomyopathy   . CHF (congestive heart failure) ([place])   . Coronary artery disease    normal coronaries by 10/10/08 cath, Cardiac Cath 08-04-12 epic.Dr. Doylene Canard follows  . Diabetes mellitus    pt. states he's borderline diabetic., no longer taking med,- off med. since 2013  . Dialysis patient [facility])    Mon-Wed-Fri([place])- Left AV fistula  . Diverticulitis November 2016   reoccurred in December 2016  . ESRD (end stage renal disease) ([place])   . GERD (gastroesophageal reflux disease)   . Gout   . History of nephrectomy 07/04/2012   History of right nephrectomy in 2002 for renal cell carcinoma   . History of unilateral nephrectomy   . Hypertension   . Low iron   . Myocardial infarction [facility]) ?2006  . Renal cell carcinoma    dialysis- MWF- Dr. Mercy Moore follows.  . Renal insufficiency   . Shortness of breath 05/19/11   "at rest, lying down, w/exertion"  . Stroke [facility]) 02/2011   05/19/11 denies residual  . Umbilical hernia 70/48/88   unrepaired      Patient Active Problem List   Diagnosis Date Noted  . Bile leak, postoperative 03/15/2016  . Elevated troponin 03/15/2016  . Rib pain on right side   . Biliary dyskinesia 02/12/2016  . Atrial flutter with rapid ventricular response ([place]) 06/27/2015  . Diverticulitis 04/21/2015  . Acute diverticulitis 04/21/2015  . Diverticulitis of intestine without perforation or abscess without bleeding 04/21/2015  . Pulmonary edema 01/13/2015  . Right upper quadrant abdominal pain 01/13/2015  . Acute respiratory failure with hypoxia ([place]) 01/13/2015  . Diverticulitis large intestine w/o perforation or abscess w/o bleeding 12/18/2014  . Hepatic cyst 12/18/2014  . Aftercare following surgery of the circulatory system, [place] 06/22/2013  . ESRD (end stage renal disease) on dialysis ([place]) 11/28/2012  . Right sided weakness 10/05/2012  . History of nephrectomy 07/04/2012  . End-stage renal disease on hemodialysis ([place]) 06/04/2011  . Systolic CHF, chronic ([place]) 05/19/2011    Class: Acute  . Hypertension, benign 05/19/2011    Class: Chronic  . DM II (diabetes mellitus, type II), controlled ([place]) 05/19/2011    Class: Chronic  . Gout, chronic 05/19/2011    Past Surgical History:  Procedure Laterality Date  . AV FISTULA PLACEMENT  03/29/2011   Procedure: ARTERIOVENOUS (AV) FISTULA CREATION;  Surgeon: Hinda Lenis, MD;  Location: [place];  Service: Vascular;  Laterality: Left;  LEFT RADIOCEPHALIC , Arteriovenous [id number](79024)  . CARDIAC CATHETERIZATION  05/21/11  . CARDIAC CATHETERIZATION N/A 03/16/2016   Procedure: Left Heart Cath and Coronary Angiography;  Surgeon: Dixie Dials, MD;  Location: [place] CV LAB;  Service: Cardiovascular;  Laterality: N/A;  . CHOLECYSTECTOMY N/A 02/12/2016   Procedure: LAPAROSCOPIC CHOLECYSTECTOMY WITH INTRAOPERATIVE CHOLANGIOGRAM;  Surgeon: Jackolyn Confer, MD;  Location: [place];  Service: General;  Laterality: N/A;  . COLONOSCOPY WITH PROPOFOL N/A 02/01/2017    Procedure: COLONOSCOPY WITH PROPOFOL;  Surgeon: Juanita Craver, MD;  Location: [facility];  Service: Endoscopy;  Laterality: N/A;  . dialysis cath placed    . ESOPHAGOGASTRODUODENOSCOPY (EGD) WITH PROPOFOL N/A 12/02/2015   Procedure: ESOPHAGOGASTRODUODENOSCOPY (EGD) WITH PROPOFOL;  Surgeon: Juanita Craver, MD;  Location: [facility];  Service: Endoscopy;  Laterality: N/A;  . FINGER SURGERY     L pinkie finger- ORIF- /w remaining hardware - 1990's    . HERNIA REPAIR N/A 0/97/3532   Umbilical hernia repair  . INSERTION OF DIALYSIS CATHETER N/A 01/27/2016   Procedure: INSERTION OF DIALYSIS CATHETER;  Surgeon: Waynetta Sandy, MD;  Location: [place];  Service: Vascular;  Laterality: N/A;  . INSERTION OF MESH N/A 07/04/2012   Procedure: INSERTION OF MESH;  Surgeon: Madilyn Hook, DO;  Location: [place];  Service: General;  Laterality: N/A;  . LAPAROSCOPIC LYSIS OF ADHESIONS N/A 02/12/2016   Procedure: LAPAROSCOPIC LYSIS OF ADHESIONS;  Surgeon: Jackolyn Confer, MD;  Location: [place];  Service: General;  Laterality: N/A;  . LEFT AND RIGHT HEART CATHETERIZATION WITH CORONARY ANGIOGRAM N/A 08/04/2012   Procedure: LEFT AND RIGHT HEART CATHETERIZATION WITH CORONARY ANGIOGRAM;  Surgeon: Birdie Riddle, MD;  Location: [place] CATH LAB;  Service: Cardiovascular;  Laterality: N/A;  . NEPHRECTOMY  2000   right  . REVISON OF ARTERIOVENOUS FISTULA Left 06/05/2013   Procedure: REVISON OF LEFT RADIOCEPHALIC  ARTERIOVENOUS FISTULA;  Surgeon: Conrad Aberdeen, MD;  Location: [place];  Service: Vascular;  Laterality: Left;  . REVISON OF ARTERIOVENOUS FISTULA Left 01/27/2016   Procedure: REVISION OF LEFT UPPER EXTREMITY ARTERIOVENOUS FISTULA;  Surgeon: Waynetta Sandy, MD;  Location: [place];  Service: Vascular;  Laterality: Left;  . REVISON OF ARTERIOVENOUS FISTULA Left 08/03/2016   Procedure: REVISON OF Left arm ARTERIOVENOUS FISTULA;  Surgeon: Elam Dutch, MD;  Location: [place];  Service: Vascular;  Laterality: Left;  .  RIGHT HEART CATHETERIZATION N/A 05/21/2011   Procedure: RIGHT HEART CATH;  Surgeon: Birdie Riddle, MD;  Location: [facility] CATH LAB;  Service: Cardiovascular;  Laterality: N/A;  . smashed  1990's   "left pinky; have a plate in there"  . UMBILICAL HERNIA REPAIR N/A 07/04/2012   Procedure: LAPAROSCOPIC UMBILICAL HERNIA;  Surgeon: Madilyn Hook, DO;  Location: [place];  Service: General;  Laterality: N/A;  . US ECHOCARDIOGRAPHY  05/20/11       Home Medications    Prior to Admission medications   Medication Sig Start Date End Date Taking? Authorizing Provider  allopurinol (ZYLOPRIM) 300 MG tablet Take 300 mg by mouth daily.  12/07/15   [provider]  amiodarone (PACERONE) 200 MG tablet Take 0.5 tablets (100 mg total) by mouth daily. 07/05/15   Dixie Dials, MD  amLODipine (NORVASC) 5 MG tablet Take 5 mg by mouth daily.     [provider]  calcium acetate (PHOSLO) 667 MG capsule Take 3 capsules by mouth 3 (three) times daily. 01/17/17   [provider]  carvedilol (COREG)  3.125 MG tablet Take 1 tablet (3.125 mg total) by mouth 2 (two) times daily with a meal. 03/21/16   Sheikh, Omair Latif, DO  cephALEXin (KEFLEX) 500 MG capsule Take 1 capsule (500 mg total) by mouth 3 (three) times daily. 01/30/17 02/09/17  Shelda Pal, DO  chlorhexidine (PERIDEX) 0.12 % solution RINSE WITH 1/2 OZ TWICE A DAY (WITH ORAL HYGIENE ROUTINE) 06/25/16   [provider]  cholecalciferol (VITAMIN D) 1000 units tablet Take 1,000 Units by mouth daily.    [provider]  cinacalcet (SENSIPAR) 90 MG tablet Take 90 mg by mouth daily with supper.     [provider]  isosorbide dinitrate (ISORDIL) 20 MG tablet Take 20 mg by mouth daily.     [provider]  lidocaine-prilocaine (EMLA) cream Apply 1 application topically every Monday, Wednesday, and Friday. Apply to port access prior to dialysis 04/08/15   [provider]  midodrine (PROAMATINE) 10 MG  tablet Take 1 tablet (10 mg total) by mouth 3 (three) times daily with meals. 08/03/16   Virgina Jock A, PA-C  midodrine (PROAMATINE) 5 MG tablet Take 1 tablet by mouth as needed. Before dialysis; may repeat x 1 11/15/16   [provider]  multivitamin (RENA-VIT) TABS tablet Take 1 tablet by mouth at bedtime. 03/21/16   Raiford Noble Latif, DO  nitroGLYCERIN (NITROSTAT) 0.4 MG SL tablet Place 0.4 mg under the tongue every 5 (five) minutes as needed for chest pain.  05/20/15   [provider]  Nutritional Supplements (FEEDING SUPPLEMENT, NEPRO CARB STEADY,) LIQD Take 237 mLs by mouth 2 (two) times daily between meals. Patient taking differently: Take 237 mLs by mouth every other day.  03/21/16   Sheikh, Omair Latif, DO  pantoprazole (PROTONIX) 40 MG tablet Take 1 tablet (40 mg total) by mouth 2 (two) times daily. 01/27/16   Alvia Grove, PA-C  sevelamer carbonate (RENVELA) 800 MG tablet Take 2-3 tablets (1,600-2,400 mg total) by mouth See admin instructions. 1600 mg with snacks and 2400 mg with meals 08/03/16   Virgina Jock A, PA-C  sulfamethoxazole-trimethoprim (BACTRIM DS,SEPTRA DS) 800-160 MG tablet Take 1 tablet by mouth 2 (two) times daily. 02/03/17 02/10/17  Malena Timpone, PA-C    Family History Family History  Problem Relation Age of Onset  . Hypertension Mother   . Kidney disease Mother     Social History Social History  Substance Use Topics  . Smoking status: Never Smoker  . Smokeless tobacco: Never Used  . Alcohol use No     Allergies   Ace inhibitors   Review of Systems Review of Systems  Musculoskeletal: Positive for arthralgias and joint swelling.  Allergic/Immunologic: Negative for immunocompromised state.  Neurological: Negative for numbness.  Hematological: Does not bruise/bleed easily.     Physical Exam Updated Vital Signs BP (!) 120/92 (BP Location: Right Arm)   Pulse 81   Temp 98.5 F (36.9 C) (Oral)   Resp 18   SpO2 100%    Physical Exam  Constitutional: He is oriented to person, place, and time. He appears well-developed and well-nourished. No distress.  HENT:  Head: Normocephalic and atraumatic.  Eyes: EOM are normal.  Neck: Normal range of motion.  Pulmonary/Chest: Effort normal.  Abdominal: He exhibits no distension.  Musculoskeletal: Normal range of motion. He exhibits edema and tenderness.  TTP of distal R middle finger. Decreased ROM of DIP due to pain and swelling. Sensation intact of all fingers. Strength against resistance intact. Radial pulses intact  bilaterally.   Neurological: He is alert and oriented to person, place, and time. No sensory deficit.  Skin: Skin is warm. No rash noted.  Swelling of distal R middle finger. No obvious erythema. Mild warmth of distal finger. No extension beyond the DIP joint. No active drainage. No swelling, redness, or warmth of other fingers or hand. No streaking.   Psychiatric: He has a normal mood and affect.  Nursing note and vitals reviewed.    ED Treatments / Results  Labs (all labs ordered are listed, but only abnormal results are displayed) Labs Reviewed - No data to display  EKG  EKG Interpretation None       Radiology Dg Finger Middle Right  Result Date: 02/03/2017 CLINICAL DATA:  Soft tissue infection.  History of gout. EXAM: RIGHT MIDDLE FINGER 2+V COMPARISON:  No prior. FINDINGS: No acute soft tissue abnormality identified. No radiopaque foreign body. No evidence of fracture or dislocation. IMPRESSION: No acute abnormality . Electronically Signed   By: Marcello Moores  Register   On: 02/03/2017 09:59    Procedures .Marland KitchenIncision and Drainage Date/Time: 02/03/2017 10:15 AM Performed by: Franchot Heidelberg Authorized by: Franchot Heidelberg   Consent:    Consent obtained:  Verbal   Consent given by:  Patient   Risks discussed:  Bleeding, incomplete drainage, infection and pain Location:    Type:  Abscess   Location:  Upper extremity   Upper  extremity location:  Finger   Finger location:  R long finger Pre-procedure details:    Skin preparation:  Betadine Anesthesia (see MAR for exact dosages):    Anesthesia method:  Nerve block   Block anesthetic:  Lidocaine 2% WITH epi   Block technique:  Digital nerve block   Block injection procedure:  Anatomic landmarks identified, introduced needle and negative aspiration for blood   Block outcome:  Anesthesia achieved Procedure type:    Complexity:  Simple Procedure details:    Incision types:  Single straight   Incision depth:  Dermal   Scalpel blade:  11   Wound management:  Irrigated with saline   Drainage:  Purulent   Drainage amount:  Moderate   Wound treatment:  Wound left open   Packing materials:  None Post-procedure details:    Patient tolerance of procedure:  Tolerated well, no immediate complications   (including critical care time)  Medications Ordered in ED Medications  lidocaine-EPINEPHrine (XYLOCAINE W/EPI) 2 %-1:200000 (PF) injection 10 mL (10 mLs Infiltration Given by Other 02/03/17 1000)     Initial Impression / Assessment and Plan / ED Course  I have reviewed the triage vital signs and the nursing notes.  Pertinent labs & imaging results that were available during my care of the patient were reviewed by me and considered in my medical decision making (see chart for details).     Pt presenting with paronychia of R middle finger x1 week. No obvious surrounding cellulitis. Xray of finger negative for osteomyelitis. I&D successful. VS without sings of systemic infection. Will add bactrim for MRSA coverage. Aftercare instructions given. Pt appears safe for discharge. Return precautions given. Pt states he understands and agrees to plan.   Final Clinical Impressions(s) / ED Diagnoses   Final diagnoses:  Paronychia of finger of right hand    New Prescriptions Discharge Medication List as of 02/03/2017 10:30 AM    START taking these medications   Details   sulfamethoxazole-trimethoprim (BACTRIM DS,SEPTRA DS) 800-160 MG tablet Take 1 tablet by mouth 2 (two) times daily., Starting Thu  02/03/2017, Until Thu 02/10/2017, Print         Clara Smolen, PA-C 02/04/17 1216    Julianne Rice, MD 02/04/17 [phone]

## 2017-02-03 NOTE — ED Notes (Signed)
Pt returned from xray

## 2017-02-04 DIAGNOSIS — N2581 Secondary hyperparathyroidism of renal origin: Secondary | ICD-10-CM | POA: Diagnosis not present

## 2017-02-04 DIAGNOSIS — E1129 Type 2 diabetes mellitus with other diabetic kidney complication: Secondary | ICD-10-CM | POA: Diagnosis not present

## 2017-02-04 DIAGNOSIS — N186 End stage renal disease: Secondary | ICD-10-CM | POA: Diagnosis not present

## 2017-02-05 NOTE — Addendum Note (Signed)
Addendum  created 02/05/17 0743 by Albertha Ghee, MD   Anesthesia Staff edited

## 2017-02-07 DIAGNOSIS — N186 End stage renal disease: Secondary | ICD-10-CM | POA: Diagnosis not present

## 2017-02-07 DIAGNOSIS — E1129 Type 2 diabetes mellitus with other diabetic kidney complication: Secondary | ICD-10-CM | POA: Diagnosis not present

## 2017-02-07 DIAGNOSIS — N2581 Secondary hyperparathyroidism of renal origin: Secondary | ICD-10-CM | POA: Diagnosis not present

## 2017-02-08 DIAGNOSIS — L03011 Cellulitis of right finger: Secondary | ICD-10-CM | POA: Diagnosis not present

## 2017-02-09 DIAGNOSIS — N186 End stage renal disease: Secondary | ICD-10-CM | POA: Diagnosis not present

## 2017-02-09 DIAGNOSIS — E1129 Type 2 diabetes mellitus with other diabetic kidney complication: Secondary | ICD-10-CM | POA: Diagnosis not present

## 2017-02-09 DIAGNOSIS — N2581 Secondary hyperparathyroidism of renal origin: Secondary | ICD-10-CM | POA: Diagnosis not present

## 2017-02-11 DIAGNOSIS — N2581 Secondary hyperparathyroidism of renal origin: Secondary | ICD-10-CM | POA: Diagnosis not present

## 2017-02-11 DIAGNOSIS — E1129 Type 2 diabetes mellitus with other diabetic kidney complication: Secondary | ICD-10-CM | POA: Diagnosis not present

## 2017-02-11 DIAGNOSIS — N186 End stage renal disease: Secondary | ICD-10-CM | POA: Diagnosis not present

## 2017-02-14 DIAGNOSIS — N186 End stage renal disease: Secondary | ICD-10-CM | POA: Diagnosis not present

## 2017-02-14 DIAGNOSIS — N2581 Secondary hyperparathyroidism of renal origin: Secondary | ICD-10-CM | POA: Diagnosis not present

## 2017-02-14 DIAGNOSIS — E1129 Type 2 diabetes mellitus with other diabetic kidney complication: Secondary | ICD-10-CM | POA: Diagnosis not present

## 2017-02-15 DIAGNOSIS — I871 Compression of vein: Secondary | ICD-10-CM | POA: Diagnosis not present

## 2017-02-15 DIAGNOSIS — T82858A Stenosis of vascular prosthetic devices, implants and grafts, initial encounter: Secondary | ICD-10-CM | POA: Diagnosis not present

## 2017-02-15 DIAGNOSIS — Z992 Dependence on renal dialysis: Secondary | ICD-10-CM | POA: Diagnosis not present

## 2017-02-15 DIAGNOSIS — N186 End stage renal disease: Secondary | ICD-10-CM | POA: Diagnosis not present

## 2017-02-16 DIAGNOSIS — N2581 Secondary hyperparathyroidism of renal origin: Secondary | ICD-10-CM | POA: Diagnosis not present

## 2017-02-16 DIAGNOSIS — E1129 Type 2 diabetes mellitus with other diabetic kidney complication: Secondary | ICD-10-CM | POA: Diagnosis not present

## 2017-02-16 DIAGNOSIS — N186 End stage renal disease: Secondary | ICD-10-CM | POA: Diagnosis not present

## 2017-02-17 DIAGNOSIS — R42 Dizziness and giddiness: Secondary | ICD-10-CM | POA: Diagnosis not present

## 2017-02-17 DIAGNOSIS — I120 Hypertensive chronic kidney disease with stage 5 chronic kidney disease or end stage renal disease: Secondary | ICD-10-CM | POA: Diagnosis not present

## 2017-02-17 DIAGNOSIS — Z7901 Long term (current) use of anticoagulants: Secondary | ICD-10-CM | POA: Diagnosis not present

## 2017-02-17 DIAGNOSIS — N186 End stage renal disease: Secondary | ICD-10-CM | POA: Diagnosis not present

## 2017-02-17 DIAGNOSIS — E6609 Other obesity due to excess calories: Secondary | ICD-10-CM | POA: Diagnosis not present

## 2017-02-18 DIAGNOSIS — I4892 Unspecified atrial flutter: Secondary | ICD-10-CM | POA: Diagnosis not present

## 2017-02-18 DIAGNOSIS — N186 End stage renal disease: Secondary | ICD-10-CM | POA: Diagnosis not present

## 2017-02-18 DIAGNOSIS — E1129 Type 2 diabetes mellitus with other diabetic kidney complication: Secondary | ICD-10-CM | POA: Diagnosis not present

## 2017-02-18 DIAGNOSIS — N2581 Secondary hyperparathyroidism of renal origin: Secondary | ICD-10-CM | POA: Diagnosis not present

## 2017-02-20 DIAGNOSIS — N186 End stage renal disease: Secondary | ICD-10-CM | POA: Diagnosis not present

## 2017-02-20 DIAGNOSIS — Z992 Dependence on renal dialysis: Secondary | ICD-10-CM | POA: Diagnosis not present

## 2017-02-20 DIAGNOSIS — I120 Hypertensive chronic kidney disease with stage 5 chronic kidney disease or end stage renal disease: Secondary | ICD-10-CM | POA: Diagnosis not present

## 2017-02-21 DIAGNOSIS — T8249XD Other complication of vascular dialysis catheter, subsequent encounter: Secondary | ICD-10-CM | POA: Diagnosis not present

## 2017-02-21 DIAGNOSIS — D631 Anemia in chronic kidney disease: Secondary | ICD-10-CM | POA: Diagnosis not present

## 2017-02-21 DIAGNOSIS — N186 End stage renal disease: Secondary | ICD-10-CM | POA: Diagnosis not present

## 2017-02-21 DIAGNOSIS — E1129 Type 2 diabetes mellitus with other diabetic kidney complication: Secondary | ICD-10-CM | POA: Diagnosis not present

## 2017-02-21 DIAGNOSIS — L0390 Cellulitis, unspecified: Secondary | ICD-10-CM | POA: Diagnosis not present

## 2017-02-21 DIAGNOSIS — N2581 Secondary hyperparathyroidism of renal origin: Secondary | ICD-10-CM | POA: Diagnosis not present

## 2017-02-22 ENCOUNTER — Emergency Department (HOSPITAL_COMMUNITY): Payer: Medicare Other

## 2017-02-22 ENCOUNTER — Encounter (HOSPITAL_COMMUNITY): Payer: Self-pay | Admitting: Emergency Medicine

## 2017-02-22 ENCOUNTER — Emergency Department (HOSPITAL_COMMUNITY)
Admission: EM | Admit: 2017-02-22 | Discharge: 2017-02-22 | Disposition: A | Discharge status: Home / Self Care | Payer: Medicare Other | Attending: Emergency Medicine | Admitting: Emergency Medicine

## 2017-02-22 DIAGNOSIS — I132 Hypertensive heart and chronic kidney disease with heart failure and with stage 5 chronic kidney disease, or end stage renal disease: Secondary | ICD-10-CM | POA: Diagnosis not present

## 2017-02-22 DIAGNOSIS — Z79899 Other long term (current) drug therapy: Secondary | ICD-10-CM | POA: Insufficient documentation

## 2017-02-22 DIAGNOSIS — R0789 Other chest pain: Secondary | ICD-10-CM | POA: Insufficient documentation

## 2017-02-22 DIAGNOSIS — Z992 Dependence on renal dialysis: Secondary | ICD-10-CM | POA: Diagnosis not present

## 2017-02-22 DIAGNOSIS — K654 Sclerosing mesenteritis: Secondary | ICD-10-CM | POA: Diagnosis not present

## 2017-02-22 DIAGNOSIS — I2510 Atherosclerotic heart disease of native coronary artery without angina pectoris: Secondary | ICD-10-CM | POA: Diagnosis not present

## 2017-02-22 DIAGNOSIS — N186 End stage renal disease: Secondary | ICD-10-CM | POA: Diagnosis not present

## 2017-02-22 DIAGNOSIS — E1122 Type 2 diabetes mellitus with diabetic chronic kidney disease: Secondary | ICD-10-CM | POA: Diagnosis not present

## 2017-02-22 DIAGNOSIS — I5020 Unspecified systolic (congestive) heart failure: Secondary | ICD-10-CM | POA: Insufficient documentation

## 2017-02-22 DIAGNOSIS — Z8673 Personal history of transient ischemic attack (TIA), and cerebral infarction without residual deficits: Secondary | ICD-10-CM | POA: Insufficient documentation

## 2017-02-22 DIAGNOSIS — I252 Old myocardial infarction: Secondary | ICD-10-CM | POA: Insufficient documentation

## 2017-02-22 DIAGNOSIS — Z85528 Personal history of other malignant neoplasm of kidney: Secondary | ICD-10-CM | POA: Insufficient documentation

## 2017-02-22 DIAGNOSIS — R079 Chest pain, unspecified: Secondary | ICD-10-CM | POA: Diagnosis not present

## 2017-02-22 DIAGNOSIS — K5732 Diverticulitis of large intestine without perforation or abscess without bleeding: Secondary | ICD-10-CM | POA: Diagnosis not present

## 2017-02-22 DIAGNOSIS — R0602 Shortness of breath: Secondary | ICD-10-CM | POA: Diagnosis not present

## 2017-02-22 DIAGNOSIS — R000 Tachycardia, unspecified: Secondary | ICD-10-CM | POA: Diagnosis not present

## 2017-02-22 DIAGNOSIS — R1010 Upper abdominal pain, unspecified: Secondary | ICD-10-CM | POA: Diagnosis present

## 2017-02-22 LAB — CBC WITH DIFFERENTIAL/PLATELET
Basophils Absolute: 0 10*3/uL (ref 0.0–0.1)
Basophils Relative: 0 %
Eosinophils Absolute: 0.1 10*3/uL (ref 0.0–0.7)
Eosinophils Relative: 3 %
HCT: 41.4 % (ref 39.0–52.0)
Hemoglobin: 13.4 g/dL (ref 13.0–17.0)
Lymphocytes Relative: 23 %
Lymphs Abs: 1.1 10*3/uL (ref 0.7–4.0)
MCH: 31.8 pg (ref 26.0–34.0)
MCHC: 32.4 g/dL (ref 30.0–36.0)
MCV: 98.3 fL (ref 78.0–100.0)
Monocytes Absolute: 0.3 10*3/uL (ref 0.1–1.0)
Monocytes Relative: 7 %
Neutro Abs: 3.1 10*3/uL (ref 1.7–7.7)
Neutrophils Relative %: 67 %
Platelets: 191 10*3/uL (ref 150–400)
RBC: 4.21 MIL/uL — ABNORMAL LOW (ref 4.22–5.81)
RDW: 15.8 % — ABNORMAL HIGH (ref 11.5–15.5)
WBC: 4.6 10*3/uL (ref 4.0–10.5)

## 2017-02-22 LAB — COMPREHENSIVE METABOLIC PANEL
ALT: 15 U/L — ABNORMAL LOW (ref 17–63)
AST: 20 U/L (ref 15–41)
Albumin: 3.4 g/dL — ABNORMAL LOW (ref 3.5–5.0)
Alkaline Phosphatase: 114 U/L (ref 38–126)
Anion gap: 14 (ref 5–15)
BUN: 28 mg/dL — ABNORMAL HIGH (ref 6–20)
CO2: 26 mmol/L (ref 22–32)
Calcium: 9.2 mg/dL (ref 8.9–10.3)
Chloride: 95 mmol/L — ABNORMAL LOW (ref 101–111)
Creatinine, Ser: 10.95 mg/dL — ABNORMAL HIGH (ref 0.61–1.24)
GFR calc Af Amer: 5 mL/min — ABNORMAL LOW (ref >60)
GFR calc non Af Amer: 5 mL/min — ABNORMAL LOW (ref >60)
Glucose, Bld: 105 mg/dL — ABNORMAL HIGH (ref 65–99)
Potassium: 4.8 mmol/L (ref 3.5–5.1)
Sodium: 135 mmol/L (ref 135–145)
Total Bilirubin: 0.5 mg/dL (ref 0.3–1.2)
Total Protein: 8.4 g/dL — ABNORMAL HIGH (ref 6.5–8.1)

## 2017-02-22 LAB — I-STAT CG4 LACTIC ACID, ED
Lactic Acid, Venous: 2.1 mmol/L (ref 0.5–1.9)
Lactic Acid, Venous: 1.33 mmol/L (ref 0.5–1.9)

## 2017-02-22 LAB — PROTIME-INR
INR: 1.21
Prothrombin Time: 15.2 seconds (ref 11.4–15.2)

## 2017-02-22 MED ORDER — FENTANYL CITRATE (PF) 100 MCG/2ML IJ SOLN
50.0000 ug | Freq: Once | INTRAMUSCULAR | Status: AC
  Administered 2017-02-22: 50 ug via INTRAVENOUS
  Filled 2017-02-22: qty 2

## 2017-02-22 MED ORDER — MORPHINE SULFATE (PF) 4 MG/ML IV SOLN
4.0000 mg | Freq: Once | INTRAVENOUS | Status: AC
  Administered 2017-02-22: 4 mg via INTRAVENOUS
  Filled 2017-02-22: qty 1

## 2017-02-22 MED ORDER — METHYLPREDNISOLONE SODIUM SUCC 125 MG IJ SOLR
125.0000 mg | Freq: Once | INTRAMUSCULAR | Status: AC
  Administered 2017-02-22: 125 mg via INTRAVENOUS
  Filled 2017-02-22: qty 2

## 2017-02-22 MED ORDER — IOPAMIDOL (ISOVUE-300) INJECTION 61%
INTRAVENOUS | Status: AC
  Administered 2017-02-22: 100 mL
  Filled 2017-02-22: qty 100

## 2017-02-22 MED ORDER — ONDANSETRON HCL 4 MG/2ML IJ SOLN
4.0000 mg | Freq: Once | INTRAMUSCULAR | Status: AC
  Administered 2017-02-22: 4 mg via INTRAVENOUS
  Filled 2017-02-22: qty 2

## 2017-02-22 MED ORDER — PREDNISONE 10 MG (21) PO TBPK: ORAL_TABLET | Freq: Every day | ORAL | 0 refills | Status: DC

## 2017-02-22 MED ORDER — CIPROFLOXACIN IN D5W 400 MG/200ML IV SOLN
400.0000 mg | Freq: Once | INTRAVENOUS | Status: AC
  Administered 2017-02-22: 400 mg via INTRAVENOUS
  Filled 2017-02-22: qty 200

## 2017-02-22 MED ORDER — METRONIDAZOLE IN NACL 5-0.79 MG/ML-% IV SOLN
500.0000 mg | Freq: Three times a day (TID) | INTRAVENOUS | Status: DC
  Administered 2017-02-22: 500 mg via INTRAVENOUS
  Filled 2017-02-22: qty 100

## 2017-02-22 NOTE — ED Provider Notes (Signed)
[facility] DEPT Provider Note   CSN: [id number] Arrival date & time: 02/22/17  0809     History   Chief Complaint Chief Complaint  Patient presents with  . Shortness of Breath  . Back Pain    HPI [name] is a 56 y.o. male.  HPI  This 56 year old male patient presents with concern of right flank and chest pain. Pain began yesterday, since onset has been persistent, more severe in the right flank, and the chest pain seemingly is mostly present with deep inspiration. No fever, no chills. The flank and side pain is sore, severe, nonradiating.   Patient does have recent change in bowel movements, but also notes that he was recently on antibiotics for a finger infection.  He has multiple medical issues, most notably end-stage renal disease on dialysis. Patient completed a full dialysis session yesterday.      Past Medical History:  Diagnosis Date  . Atrial flutter ([place])   . 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 Cardiomyopathy   . CHF (congestive heart failure) ([place])   . Coronary artery disease    normal coronaries by 10/10/08 cath, Cardiac Cath 08-04-12 epic.Dr. Doylene Canard follows  . Diabetes mellitus    pt. states he's borderline diabetic., no longer taking med,- off med. since 2013  . Dialysis patient [facility])    Mon-Wed-Fri([place])- Left AV fistula  . Diverticulitis November 2016   reoccurred in December 2016  . ESRD (end stage renal disease) ([place])   . GERD (gastroesophageal reflux disease)   . Gout   . History of nephrectomy 07/04/2012   History of right nephrectomy in 2002 for renal cell carcinoma   . History of unilateral nephrectomy   . Hypertension   . Low iron   . Myocardial infarction [facility]) ?2006  . Renal cell carcinoma    dialysis- MWF- Dr. Mercy Moore follows.  . Renal insufficiency   . Shortness of breath 05/19/11   "at rest, lying down, w/exertion"  . Stroke [facility]) 02/2011   05/19/11 denies residual  .  Umbilical hernia 81/44/81   unrepaired    Patient Active Problem List   Diagnosis Date Noted  . Bile leak, postoperative 03/15/2016  . Elevated troponin 03/15/2016  . Rib pain on right side   . Biliary dyskinesia 02/12/2016  . Atrial flutter with rapid ventricular response ([place]) 06/27/2015  . Diverticulitis 04/21/2015  . Acute diverticulitis 04/21/2015  . Diverticulitis of intestine without perforation or abscess without bleeding 04/21/2015  . Pulmonary edema 01/13/2015  . Right upper quadrant abdominal pain 01/13/2015  . Acute respiratory failure with hypoxia ([place]) 01/13/2015  . Diverticulitis large intestine w/o perforation or abscess w/o bleeding 12/18/2014  . Hepatic cyst 12/18/2014  . Aftercare following surgery of the circulatory system, [place] 06/22/2013  . ESRD (end stage renal disease) on dialysis ([place]) 11/28/2012  . Right sided weakness 10/05/2012  . History of nephrectomy 07/04/2012  . End-stage renal disease on hemodialysis ([place]) 06/04/2011  . Systolic CHF, chronic ([place]) 05/19/2011    Class: Acute  . Hypertension, benign 05/19/2011    Class: Chronic  . DM II (diabetes mellitus, type II), controlled ([place]) 05/19/2011    Class: Chronic  . Gout, chronic 05/19/2011    Past Surgical History:  Procedure Laterality Date  . AV FISTULA PLACEMENT  03/29/2011   Procedure: ARTERIOVENOUS (AV) FISTULA CREATION;  Surgeon: Hinda Lenis, MD;  Location: [facility];  Service: Vascular;  Laterality: Left;  LEFT  RADIOCEPHALIC , Arteriovenous [id number]([id number])  . CARDIAC CATHETERIZATION  05/21/11  . CARDIAC CATHETERIZATION N/A 03/16/2016   Procedure: Left Heart Cath and Coronary Angiography;  Surgeon: Dixie Dials, MD;  Location: [facility] CV LAB;  Service: Cardiovascular;  Laterality: N/A;  . CHOLECYSTECTOMY N/A 02/12/2016   Procedure: LAPAROSCOPIC CHOLECYSTECTOMY WITH INTRAOPERATIVE CHOLANGIOGRAM;  Surgeon: Jackolyn Confer, MD;  Location: [facility];  Service: General;  Laterality: N/A;  .  COLONOSCOPY WITH PROPOFOL N/A 02/01/2017   Procedure: COLONOSCOPY WITH PROPOFOL;  Surgeon: Juanita Craver, MD;  Location: [facility] ENDOS[facility];  Service: Endoscopy;  Laterality: N/A;  . dialysis cath placed    . ESOPHAGOGASTRODUODENOSCOPY (EGD) WITH PROPOFOL N/A 12/02/2015   Procedure: ESOPHAGOGASTRODUODENOSCOPY (EGD) WITH PROPOFOL;  Surgeon: Juanita Craver, MD;  [facility];  Service: Endoscopy;  Laterality: N/A;  . FINGER SURGERY     L pinkie finger- ORIF- /w remaining hardware - 1990's    . HERNIA REPAIR N/A 2/69/4854   Umbilical hernia repair  . INSERTION OF DIALYSIS CATHETER N/A 01/27/2016   Procedure: INSERTION OF DIALYSIS CATHETER;  Surgeon: Waynetta Sandy, MD;  Location: [facility];  Service: Vascular;  Laterality: N/A;  . INSERTION OF MESH N/A 07/04/2012   Procedure: INSERTION OF MESH;  Surgeon: Madilyn Hook, DO;  Location: [facility];  Service: General;  Laterality: N/A;  . LAPAROSCOPIC LYSIS OF ADHESIONS N/A 02/12/2016   Procedure: LAPAROSCOPIC LYSIS OF ADHESIONS;  Surgeon: Jackolyn Confer, MD;  Location: [facility];  Service: General;  Laterality: N/A;  . LEFT AND RIGHT HEART CATHETERIZATION WITH CORONARY ANGIOGRAM N/A 08/04/2012   Procedure: LEFT AND RIGHT HEART CATHETERIZATION WITH CORONARY ANGIOGRAM;  Surgeon: Birdie Riddle, MD;  Location: [facility] CATH LAB;  Service: Cardiovascular;  Laterality: N/A;  . NEPHRECTOMY  2000   right  . REVISON OF ARTERIOVENOUS FISTULA Left 06/05/2013   Procedure: REVISON OF LEFT RADIOCEPHALIC  ARTERIOVENOUS FISTULA;  Surgeon: Conrad Hanna, MD;  Location: [facility];  Service: Vascular;  Laterality: Left;  . REVISON OF ARTERIOVENOUS FISTULA Left 01/27/2016   Procedure: REVISION OF LEFT UPPER EXTREMITY ARTERIOVENOUS FISTULA;  Surgeon: Waynetta Sandy, MD;  Location: [place];  Service: Vascular;  Laterality: Left;  . REVISON OF ARTERIOVENOUS FISTULA Left 08/03/2016   Procedure: REVISON OF Left arm ARTERIOVENOUS FISTULA;  Surgeon: Elam Dutch, MD;  Location: [place];   Service: Vascular;  Laterality: Left;  . RIGHT HEART CATHETERIZATION N/A 05/21/2011   Procedure: RIGHT HEART CATH;  Surgeon: Birdie Riddle, MD;  Location: [facility] CATH LAB;  Service: Cardiovascular;  Laterality: N/A;  . smashed  1990's   "left pinky; have a plate in there"  . UMBILICAL HERNIA REPAIR N/A 07/04/2012   Procedure: LAPAROSCOPIC UMBILICAL HERNIA;  Surgeon: Madilyn Hook, DO;  Location: [facility];  Service: General;  Laterality: N/A;  . US ECHOCARDIOGRAPHY  05/20/11       Home Medications    Prior to Admission medications   Medication Sig Start Date End Date Taking? Authorizing Provider  allopurinol (ZYLOPRIM) 300 MG tablet Take 300 mg by mouth daily.  12/07/15   [provider]  amiodarone (PACERONE) 200 MG tablet Take 0.5 tablets (100 mg total) by mouth daily. 07/05/15   Dixie Dials, MD  amLODipine (NORVASC) 5 MG tablet Take 5 mg by mouth daily.     [provider]  calcium acetate (PHOSLO) 667 MG capsule Take 3 capsules by mouth 3 (three) times daily. 01/17/17   [provider]  carvedilol (COREG) 3.125 MG tablet Take 1 tablet (3.125  mg total) by mouth 2 (two) times daily with a meal. 03/21/16   Sheikh, Omair Latif, DO  chlorhexidine (PERIDEX) 0.12 % solution RINSE WITH 1/2 OZ TWICE A DAY (WITH ORAL HYGIENE ROUTINE) 06/25/16   [provider]  cholecalciferol (VITAMIN D) 1000 units tablet Take 1,000 Units by mouth daily.    [provider]  cinacalcet (SENSIPAR) 90 MG tablet Take 90 mg by mouth daily with supper.     [provider]  isosorbide dinitrate (ISORDIL) 20 MG tablet Take 20 mg by mouth daily.     [provider]  lidocaine-prilocaine (EMLA) cream Apply 1 application topically every Monday, Wednesday, and Friday. Apply to port access prior to dialysis 04/08/15   [provider]  midodrine (PROAMATINE) 10 MG tablet Take 1 tablet (10 mg total) by mouth 3 (three) times daily with meals. 08/03/16   Virgina Jock  A, PA-C  midodrine (PROAMATINE) 5 MG tablet Take 1 tablet by mouth as needed. Before dialysis; may repeat x 1 11/15/16   [provider]  multivitamin (RENA-VIT) TABS tablet Take 1 tablet by mouth at bedtime. 03/21/16   Raiford Noble Latif, DO  nitroGLYCERIN (NITROSTAT) 0.4 MG SL tablet Place 0.4 mg under the tongue every 5 (five) minutes as needed for chest pain.  05/20/15   [provider]  Nutritional Supplements (FEEDING SUPPLEMENT, NEPRO CARB STEADY,) LIQD Take 237 mLs by mouth 2 (two) times daily between meals. Patient taking differently: Take 237 mLs by mouth every other day.  03/21/16   Sheikh, Omair Latif, DO  pantoprazole (PROTONIX) 40 MG tablet Take 1 tablet (40 mg total) by mouth 2 (two) times daily. 01/27/16   Alvia Grove, PA-C  sevelamer carbonate (RENVELA) 800 MG tablet Take 2-3 tablets (1,600-2,400 mg total) by mouth See admin instructions. 1600 mg with snacks and 2400 mg with meals 08/03/16   Alvia Grove, PA-C    Family History Family History  Problem Relation Age of Onset  . Hypertension Mother   . Kidney disease Mother     Social History Social History  Substance Use Topics  . Smoking status: Never Smoker  . Smokeless tobacco: Never Used  . Alcohol use No     Allergies   Ace inhibitors   Review of Systems Review of Systems  Constitutional:       Per HPI, otherwise negative  HENT:       Per HPI, otherwise negative  Respiratory:       Per HPI, otherwise negative  Cardiovascular:       Per HPI, otherwise negative  Gastrointestinal: Negative for vomiting.  Endocrine:       Negative aside from HPI  Genitourinary:       Neg aside from HPI   Musculoskeletal:       Per HPI, otherwise negative  Skin: Negative.   Allergic/Immunologic: Positive for immunocompromised state.  Neurological: Negative for syncope.     Physical Exam Updated Vital Signs BP 92/68 (BP Location: Right Arm)   Pulse (!) 101   Temp 97.8 F (36.6 C) (Oral)    Resp 18   Ht 6' (1.829 m)   Wt 113.4 kg (250 lb)   SpO2 100%   BMI 33.91 kg/m   Physical Exam  Constitutional: He is oriented to person, place, and time. He appears well-developed. No distress.  HENT:  Head: Normocephalic and atraumatic.  Eyes: Conjunctivae and EOM are normal.  Cardiovascular: Normal rate and regular rhythm.   Pulmonary/Chest: Effort normal.  No stridor. No respiratory distress.  Abdominal: He exhibits no distension.  Tender to palpation right lateral, right flank, otherwise no abdominal pain  Musculoskeletal: He exhibits no edema.  Neurological: He is alert and oriented to person, place, and time.  Skin: Skin is warm and dry.  Left AV fistula unremarkable  Psychiatric: He has a normal mood and affect.  Nursing note and vitals reviewed.    ED Treatments / Results  Labs (all labs ordered are listed, but only abnormal results are displayed) Labs Reviewed  COMPREHENSIVE METABOLIC PANEL - Abnormal; Notable for the following:       Result Value   Chloride 95 (*)    Glucose, Bld 105 (*)    BUN 28 (*)    Creatinine, Ser 10.95 (*)    Total Protein 8.4 (*)    Albumin 3.4 (*)    ALT 15 (*)    GFR calc non Af Amer 5 (*)    GFR calc Af Amer 5 (*)    All other components within normal limits  CBC WITH DIFFERENTIAL/PLATELET - Abnormal; Notable for the following:    RBC 4.21 (*)    RDW 15.8 (*)    All other components within normal limits  I-STAT CG4 LACTIC ACID, ED - Abnormal; Notable for the following:    Lactic Acid, Venous 2.10 (*)    All other components within normal limits  CULTURE, BLOOD (ROUTINE X 2)  CULTURE, BLOOD (ROUTINE X 2)  PROTIME-INR    EKG  EKG Interpretation  Date/Time:  Tuesday February 22 2017 08:13:01 EDT Ventricular Rate:  102 PR Interval:  178 QRS Duration: 98 QT Interval:  374 QTC Calculation: 487 R Axis:   -25 Text Interpretation:  Sinus tachycardia Left ventricular hypertrophy T wave abnormality Abnormal ekg Confirmed by  Carmin Muskrat [phone]) on 02/22/2017 8:35:47 AM       Radiology Dg Chest 2 View  Result Date: 02/22/2017 CLINICAL DATA:  Shortness of breath, chest pain for 3 days EXAM: CHEST  2 VIEW COMPARISON:  CT chest 03/15/2016 FINDINGS: There is no focal parenchymal opacity. There is no pleural effusion or pneumothorax. There is mild cardiomegaly. The osseous structures are unremarkable. IMPRESSION: No active cardiopulmonary disease. Electronically Signed   By: Kathreen Devoid   On: 02/22/2017 08:54   Ct Abdomen Pelvis W Contrast  Result Date: 02/22/2017 CLINICAL DATA:  Recent diverticulitis.  Abdominal pain. EXAM: CT ABDOMEN AND PELVIS WITH CONTRAST TECHNIQUE: Multidetector CT imaging of the abdomen and pelvis was performed using the standard protocol following bolus administration of intravenous contrast. CONTRAST:  135mL ISOVUE-300 IOPAMIDOL (ISOVUE-300) INJECTION 61% COMPARISON:  December 17, 2016 FINDINGS: Lower chest: There is mild left base atelectasis. Lung bases otherwise are clear. Hepatobiliary: There is a stable 1 cm cyst in the left lobe of the liver. No other focal liver lesions are evident. Gallbladder is absent. There is no appreciable biliary duct dilatation. Pancreas: No pancreatic mass or inflammatory focus. Spleen: No splenic lesions are evident. Adrenals/Urinary Tract: Adrenals appear normal bilaterally. Right kidney is surgically absent. No lesion is seen in the right perinephric fossa. There are clips in this area. Left kidney appears small. There are cysts within the left kidney, likely due to chronic renal failure. Largest cyst measures 1.4 x 1.3 cm. There is no hydronephrosis on the left. No renal or ureteral calculus noted on the left. Urinary bladder is decompressed. Stomach/Bowel: There are scattered diverticula throughout the colon, most notably in the descending and proximal sigmoid colon regions.  There is again noted wall thickening in the mid to distal descending colon with slight  mesenteric stranding, a finding felt to represent a degree of diverticulitis in this area. No inflammatory foci are identified involving bowel elsewhere in the abdomen or pelvis. No bowel obstruction. No free air or portal venous air. Vascular/Lymphatic: There is atherosclerotic calcification in the aorta and common iliac arteries. There is also calcification in both internal iliac arteries. No aneurysm evident. There is no adenopathy in the abdomen or pelvis. Reproductive: Prostate and seminal vesicles appear normal in size and contour. No pelvic mass. Other: There is mesenteric thickening in the left mid abdomen without mass or adenopathy. No bowel is involved with this mesenteric thickening. This appearance is likely indicative of a degree of sclerosing mesenteritis. Appendix appears normal. No abscess or ascites evident in the abdomen or pelvis. There is a focal ventral hernia containing only fat. Musculoskeletal: There is marked disc narrowing at L4-5 with vacuum phenomenon at this level. There are no blastic or lytic bone lesions. There is degenerative change in the pubic symphysis region. No intramuscular or abdominal wall lesions are evident. IMPRESSION: 1. There is new mesenteric thickening in the right mid abdomen without associated mass or adenopathy. No bowel extends into this area. This appearance most likely is due to a degree of sclerosing mesenteritis in this region. 2. Persistent or recurrent fairly mild changes of diverticulitis in the mid the distal descending colon. No abscess or perforation in this area. Elsewhere there are scattered diverticula throughout the colon. No bowel obstruction. No abscess. Appendix appears normal. 3. Absent right kidney. Atrophic left kidney containing several small cysts. No hydronephrosis. No renal or ureteral calculus on the left. Urinary bladder completely decompressed. 4.  Aortoiliac atherosclerosis. 5.  Focal ventral hernia containing only fat. 6.  Gallbladder  absent Aortic Atherosclerosis (ICD10-I70.0). Electronically Signed   By: Lowella Grip III M.D.   On: 02/22/2017 11:31    Procedures Procedures (including critical care time)  Medications Ordered in ED Medications  metroNIDAZOLE (FLAGYL) IVPB 500 mg (500 mg Intravenous New Bag/Given 02/22/17 1224)  fentaNYL (SUBLIMAZE) injection 50 mcg (not administered)  methylPREDNISolone sodium succinate (SOLU-MEDROL) 125 mg/2 mL injection 125 mg (not administered)  morphine 4 MG/ML injection 4 mg (4 mg Intravenous Given 02/22/17 1018)  iopamidol (ISOVUE-300) 61 % injection (100 mLs  Contrast Given 02/22/17 1104)  ondansetron (ZOFRAN) injection 4 mg (4 mg Intravenous Given 02/22/17 1051)  fentaNYL (SUBLIMAZE) injection 50 mcg (50 mcg Intravenous Given 02/22/17 1224)  ciprofloxacin (CIPRO) IVPB 400 mg (0 mg Intravenous Stopped 02/22/17 1354)     Initial Impression / Assessment and Plan / ED Course  I have reviewed the triage vital signs and the nursing notes.  Pertinent labs & imaging results that were available during my care of the patient were reviewed by me and considered in my medical decision making (see chart for details).  Patient continues to have pain.   2:15 PM Patient aware of all findings, and I discussed his CT results, and presentation with his gastroenterologist. We discussed the patient's recent colonoscopy as well as today's CT scan, concerning for possible ongoing diverticulosis as well as evidence for sclerosing mesenteritis.  Patient has had good pain control here, remained afebrile.  I discussed appropriate follow-up medication with the patient's gastroenterologist, and arranged a follow-up appointment for repeat evaluation. We agreed that the patient will be started on a course of steroids, follow up in the clinic.  In regard to the patient's chest  pain, findings are reassuring, and absent distress, and with no notable arrhythmia after-hours of monitoring here, there is low  suspicion for atypical ACS or other acute new pathology.  Final Clinical Impressions(s) / ED Diagnoses  Abdominal pain Sclerosing mesenteritis Atypical chest pain   Carmin Muskrat, MD 02/22/17 1416

## 2017-02-22 NOTE — ED Triage Notes (Signed)
Pt reports SOB with lower back pain, worsening over past 2 days. Pt reports new cough, reports chills, denies fever.  Pt reports last dialyzed yesterday, reports SBP normally below 100.  Resp e/u at this time, NAD noted.

## 2017-02-22 NOTE — ED Notes (Signed)
Called pt's name, no answer.

## 2017-02-22 NOTE — ED Notes (Signed)
Patient transported to X-ray 

## 2017-02-22 NOTE — Discharge Instructions (Signed)
As discussed, your evaluation today has been largely reassuring.  But, it is important that you monitor your condition carefully, and do not hesitate to return to the ED if you develop new, or concerning changes in your condition.  Otherwise, be sure to follow-up with your gastroenterologist, Dr. Collene Mares next week as scheduled.

## 2017-02-22 NOTE — ED Notes (Signed)
Patient vomiting again. EDP notified.

## 2017-02-22 NOTE — ED Notes (Addendum)
Fentanyl 50 mcg wasted in med room sink after patient had been discharged from hospital. Witnessed by Jackolyn Confer, RN at [phone].

## 2017-02-22 NOTE — ED Notes (Signed)
Patient nauseous and vomiting into emesis bag. Dr. Vanita Panda notified. New order received for Zofran.

## 2017-02-22 NOTE — ED Notes (Signed)
Patient transported to CT 

## 2017-02-22 NOTE — ED Notes (Signed)
Gave pt orange juice and graham crackers per Horris Latino, RN

## 2017-02-23 DIAGNOSIS — E1129 Type 2 diabetes mellitus with other diabetic kidney complication: Secondary | ICD-10-CM | POA: Diagnosis not present

## 2017-02-23 DIAGNOSIS — N186 End stage renal disease: Secondary | ICD-10-CM | POA: Diagnosis not present

## 2017-02-23 DIAGNOSIS — D631 Anemia in chronic kidney disease: Secondary | ICD-10-CM | POA: Diagnosis not present

## 2017-02-23 DIAGNOSIS — N2581 Secondary hyperparathyroidism of renal origin: Secondary | ICD-10-CM | POA: Diagnosis not present

## 2017-02-23 DIAGNOSIS — L0390 Cellulitis, unspecified: Secondary | ICD-10-CM | POA: Diagnosis not present

## 2017-02-23 DIAGNOSIS — T8249XD Other complication of vascular dialysis catheter, subsequent encounter: Secondary | ICD-10-CM | POA: Diagnosis not present

## 2017-02-25 DIAGNOSIS — N186 End stage renal disease: Secondary | ICD-10-CM | POA: Diagnosis not present

## 2017-02-25 DIAGNOSIS — E1129 Type 2 diabetes mellitus with other diabetic kidney complication: Secondary | ICD-10-CM | POA: Diagnosis not present

## 2017-02-25 DIAGNOSIS — T8249XD Other complication of vascular dialysis catheter, subsequent encounter: Secondary | ICD-10-CM | POA: Diagnosis not present

## 2017-02-25 DIAGNOSIS — L0390 Cellulitis, unspecified: Secondary | ICD-10-CM | POA: Diagnosis not present

## 2017-02-25 DIAGNOSIS — D631 Anemia in chronic kidney disease: Secondary | ICD-10-CM | POA: Diagnosis not present

## 2017-02-25 DIAGNOSIS — N2581 Secondary hyperparathyroidism of renal origin: Secondary | ICD-10-CM | POA: Diagnosis not present

## 2017-02-27 LAB — CULTURE, BLOOD (ROUTINE X 2)
Culture: NO GROWTH
Culture: NO GROWTH
Special Requests: ADEQUATE
Special Requests: ADEQUATE

## 2017-02-28 DIAGNOSIS — E1129 Type 2 diabetes mellitus with other diabetic kidney complication: Secondary | ICD-10-CM | POA: Diagnosis not present

## 2017-02-28 DIAGNOSIS — N2581 Secondary hyperparathyroidism of renal origin: Secondary | ICD-10-CM | POA: Diagnosis not present

## 2017-02-28 DIAGNOSIS — L0390 Cellulitis, unspecified: Secondary | ICD-10-CM | POA: Diagnosis not present

## 2017-02-28 DIAGNOSIS — N186 End stage renal disease: Secondary | ICD-10-CM | POA: Diagnosis not present

## 2017-02-28 DIAGNOSIS — D631 Anemia in chronic kidney disease: Secondary | ICD-10-CM | POA: Diagnosis not present

## 2017-02-28 DIAGNOSIS — T8249XD Other complication of vascular dialysis catheter, subsequent encounter: Secondary | ICD-10-CM | POA: Diagnosis not present

## 2017-03-01 DIAGNOSIS — K219 Gastro-esophageal reflux disease without esophagitis: Secondary | ICD-10-CM | POA: Diagnosis not present

## 2017-03-01 DIAGNOSIS — I880 Nonspecific mesenteric lymphadenitis: Secondary | ICD-10-CM | POA: Diagnosis not present

## 2017-03-01 DIAGNOSIS — K5730 Diverticulosis of large intestine without perforation or abscess without bleeding: Secondary | ICD-10-CM | POA: Diagnosis not present

## 2017-03-02 DIAGNOSIS — N2581 Secondary hyperparathyroidism of renal origin: Secondary | ICD-10-CM | POA: Diagnosis not present

## 2017-03-02 DIAGNOSIS — L0390 Cellulitis, unspecified: Secondary | ICD-10-CM | POA: Diagnosis not present

## 2017-03-02 DIAGNOSIS — N186 End stage renal disease: Secondary | ICD-10-CM | POA: Diagnosis not present

## 2017-03-02 DIAGNOSIS — T8249XD Other complication of vascular dialysis catheter, subsequent encounter: Secondary | ICD-10-CM | POA: Diagnosis not present

## 2017-03-02 DIAGNOSIS — E1129 Type 2 diabetes mellitus with other diabetic kidney complication: Secondary | ICD-10-CM | POA: Diagnosis not present

## 2017-03-02 DIAGNOSIS — D631 Anemia in chronic kidney disease: Secondary | ICD-10-CM | POA: Diagnosis not present

## 2017-03-04 DIAGNOSIS — D631 Anemia in chronic kidney disease: Secondary | ICD-10-CM | POA: Diagnosis not present

## 2017-03-04 DIAGNOSIS — N2581 Secondary hyperparathyroidism of renal origin: Secondary | ICD-10-CM | POA: Diagnosis not present

## 2017-03-04 DIAGNOSIS — T8249XD Other complication of vascular dialysis catheter, subsequent encounter: Secondary | ICD-10-CM | POA: Diagnosis not present

## 2017-03-04 DIAGNOSIS — N186 End stage renal disease: Secondary | ICD-10-CM | POA: Diagnosis not present

## 2017-03-04 DIAGNOSIS — L0390 Cellulitis, unspecified: Secondary | ICD-10-CM | POA: Diagnosis not present

## 2017-03-04 DIAGNOSIS — E1129 Type 2 diabetes mellitus with other diabetic kidney complication: Secondary | ICD-10-CM | POA: Diagnosis not present

## 2017-03-07 DIAGNOSIS — L0390 Cellulitis, unspecified: Secondary | ICD-10-CM | POA: Diagnosis not present

## 2017-03-07 DIAGNOSIS — T8249XD Other complication of vascular dialysis catheter, subsequent encounter: Secondary | ICD-10-CM | POA: Diagnosis not present

## 2017-03-07 DIAGNOSIS — E1129 Type 2 diabetes mellitus with other diabetic kidney complication: Secondary | ICD-10-CM | POA: Diagnosis not present

## 2017-03-07 DIAGNOSIS — N2581 Secondary hyperparathyroidism of renal origin: Secondary | ICD-10-CM | POA: Diagnosis not present

## 2017-03-07 DIAGNOSIS — N186 End stage renal disease: Secondary | ICD-10-CM | POA: Diagnosis not present

## 2017-03-07 DIAGNOSIS — D631 Anemia in chronic kidney disease: Secondary | ICD-10-CM | POA: Diagnosis not present

## 2017-03-08 DIAGNOSIS — T8249XD Other complication of vascular dialysis catheter, subsequent encounter: Secondary | ICD-10-CM | POA: Diagnosis not present

## 2017-03-08 DIAGNOSIS — N186 End stage renal disease: Secondary | ICD-10-CM | POA: Diagnosis not present

## 2017-03-08 DIAGNOSIS — N2581 Secondary hyperparathyroidism of renal origin: Secondary | ICD-10-CM | POA: Diagnosis not present

## 2017-03-08 DIAGNOSIS — L0390 Cellulitis, unspecified: Secondary | ICD-10-CM | POA: Diagnosis not present

## 2017-03-08 DIAGNOSIS — E1129 Type 2 diabetes mellitus with other diabetic kidney complication: Secondary | ICD-10-CM | POA: Diagnosis not present

## 2017-03-08 DIAGNOSIS — D631 Anemia in chronic kidney disease: Secondary | ICD-10-CM | POA: Diagnosis not present

## 2017-03-09 DIAGNOSIS — T8249XD Other complication of vascular dialysis catheter, subsequent encounter: Secondary | ICD-10-CM | POA: Diagnosis not present

## 2017-03-09 DIAGNOSIS — N186 End stage renal disease: Secondary | ICD-10-CM | POA: Diagnosis not present

## 2017-03-09 DIAGNOSIS — N2581 Secondary hyperparathyroidism of renal origin: Secondary | ICD-10-CM | POA: Diagnosis not present

## 2017-03-09 DIAGNOSIS — L0390 Cellulitis, unspecified: Secondary | ICD-10-CM | POA: Diagnosis not present

## 2017-03-09 DIAGNOSIS — E1129 Type 2 diabetes mellitus with other diabetic kidney complication: Secondary | ICD-10-CM | POA: Diagnosis not present

## 2017-03-09 DIAGNOSIS — D631 Anemia in chronic kidney disease: Secondary | ICD-10-CM | POA: Diagnosis not present

## 2017-03-11 DIAGNOSIS — N186 End stage renal disease: Secondary | ICD-10-CM | POA: Diagnosis not present

## 2017-03-11 DIAGNOSIS — E1129 Type 2 diabetes mellitus with other diabetic kidney complication: Secondary | ICD-10-CM | POA: Diagnosis not present

## 2017-03-11 DIAGNOSIS — L0390 Cellulitis, unspecified: Secondary | ICD-10-CM | POA: Diagnosis not present

## 2017-03-11 DIAGNOSIS — T8249XD Other complication of vascular dialysis catheter, subsequent encounter: Secondary | ICD-10-CM | POA: Diagnosis not present

## 2017-03-11 DIAGNOSIS — D631 Anemia in chronic kidney disease: Secondary | ICD-10-CM | POA: Diagnosis not present

## 2017-03-11 DIAGNOSIS — N2581 Secondary hyperparathyroidism of renal origin: Secondary | ICD-10-CM | POA: Diagnosis not present

## 2017-03-14 DIAGNOSIS — N186 End stage renal disease: Secondary | ICD-10-CM | POA: Diagnosis not present

## 2017-03-14 DIAGNOSIS — N2581 Secondary hyperparathyroidism of renal origin: Secondary | ICD-10-CM | POA: Diagnosis not present

## 2017-03-14 DIAGNOSIS — E1129 Type 2 diabetes mellitus with other diabetic kidney complication: Secondary | ICD-10-CM | POA: Diagnosis not present

## 2017-03-14 DIAGNOSIS — D631 Anemia in chronic kidney disease: Secondary | ICD-10-CM | POA: Diagnosis not present

## 2017-03-14 DIAGNOSIS — L0390 Cellulitis, unspecified: Secondary | ICD-10-CM | POA: Diagnosis not present

## 2017-03-14 DIAGNOSIS — T8249XD Other complication of vascular dialysis catheter, subsequent encounter: Secondary | ICD-10-CM | POA: Diagnosis not present

## 2017-03-15 DIAGNOSIS — I120 Hypertensive chronic kidney disease with stage 5 chronic kidney disease or end stage renal disease: Secondary | ICD-10-CM | POA: Diagnosis not present

## 2017-03-15 DIAGNOSIS — I5022 Chronic systolic (congestive) heart failure: Secondary | ICD-10-CM | POA: Diagnosis not present

## 2017-03-15 DIAGNOSIS — I2510 Atherosclerotic heart disease of native coronary artery without angina pectoris: Secondary | ICD-10-CM | POA: Diagnosis not present

## 2017-03-15 DIAGNOSIS — E6609 Other obesity due to excess calories: Secondary | ICD-10-CM | POA: Diagnosis not present

## 2017-03-15 DIAGNOSIS — N186 End stage renal disease: Secondary | ICD-10-CM | POA: Diagnosis not present

## 2017-03-16 ENCOUNTER — Encounter (HOSPITAL_COMMUNITY): Payer: Self-pay | Admitting: Emergency Medicine

## 2017-03-16 ENCOUNTER — Observation Stay (HOSPITAL_COMMUNITY): Payer: Medicare Other

## 2017-03-16 ENCOUNTER — Emergency Department (HOSPITAL_COMMUNITY): Payer: Medicare Other

## 2017-03-16 ENCOUNTER — Observation Stay (HOSPITAL_COMMUNITY)
Admission: EM | Admit: 2017-03-16 | Discharge: 2017-03-17 | Disposition: A | Discharge status: Home / Self Care | Payer: Medicare Other | Attending: Internal Medicine | Admitting: Internal Medicine

## 2017-03-16 DIAGNOSIS — M79604 Pain in right leg: Secondary | ICD-10-CM | POA: Diagnosis not present

## 2017-03-16 DIAGNOSIS — Z992 Dependence on renal dialysis: Secondary | ICD-10-CM

## 2017-03-16 DIAGNOSIS — R748 Abnormal levels of other serum enzymes: Secondary | ICD-10-CM

## 2017-03-16 DIAGNOSIS — M79605 Pain in left leg: Secondary | ICD-10-CM | POA: Diagnosis not present

## 2017-03-16 DIAGNOSIS — I509 Heart failure, unspecified: Secondary | ICD-10-CM | POA: Insufficient documentation

## 2017-03-16 DIAGNOSIS — R4020 Unspecified coma: Secondary | ICD-10-CM | POA: Diagnosis not present

## 2017-03-16 DIAGNOSIS — G8929 Other chronic pain: Secondary | ICD-10-CM

## 2017-03-16 DIAGNOSIS — R4189 Other symptoms and signs involving cognitive functions and awareness: Secondary | ICD-10-CM

## 2017-03-16 DIAGNOSIS — L0390 Cellulitis, unspecified: Secondary | ICD-10-CM | POA: Diagnosis not present

## 2017-03-16 DIAGNOSIS — I5022 Chronic systolic (congestive) heart failure: Secondary | ICD-10-CM

## 2017-03-16 DIAGNOSIS — I132 Hypertensive heart and chronic kidney disease with heart failure and with stage 5 chronic kidney disease, or end stage renal disease: Secondary | ICD-10-CM | POA: Diagnosis not present

## 2017-03-16 DIAGNOSIS — I4892 Unspecified atrial flutter: Secondary | ICD-10-CM

## 2017-03-16 DIAGNOSIS — I959 Hypotension, unspecified: Secondary | ICD-10-CM

## 2017-03-16 DIAGNOSIS — I5042 Chronic combined systolic (congestive) and diastolic (congestive) heart failure: Secondary | ICD-10-CM | POA: Diagnosis present

## 2017-03-16 DIAGNOSIS — E119 Type 2 diabetes mellitus without complications: Secondary | ICD-10-CM | POA: Insufficient documentation

## 2017-03-16 DIAGNOSIS — N186 End stage renal disease: Secondary | ICD-10-CM | POA: Diagnosis not present

## 2017-03-16 DIAGNOSIS — E1129 Type 2 diabetes mellitus with other diabetic kidney complication: Secondary | ICD-10-CM | POA: Diagnosis not present

## 2017-03-16 DIAGNOSIS — N2581 Secondary hyperparathyroidism of renal origin: Secondary | ICD-10-CM | POA: Diagnosis not present

## 2017-03-16 DIAGNOSIS — R29810 Facial weakness: Secondary | ICD-10-CM | POA: Diagnosis not present

## 2017-03-16 DIAGNOSIS — R400 Somnolence: Secondary | ICD-10-CM | POA: Diagnosis not present

## 2017-03-16 DIAGNOSIS — R7989 Other specified abnormal findings of blood chemistry: Secondary | ICD-10-CM | POA: Diagnosis present

## 2017-03-16 DIAGNOSIS — R29818 Other symptoms and signs involving the nervous system: Secondary | ICD-10-CM | POA: Diagnosis not present

## 2017-03-16 DIAGNOSIS — R4182 Altered mental status, unspecified: Principal | ICD-10-CM | POA: Insufficient documentation

## 2017-03-16 DIAGNOSIS — R778 Other specified abnormalities of plasma proteins: Secondary | ICD-10-CM | POA: Diagnosis present

## 2017-03-16 DIAGNOSIS — I6789 Other cerebrovascular disease: Secondary | ICD-10-CM | POA: Diagnosis not present

## 2017-03-16 DIAGNOSIS — T8249XD Other complication of vascular dialysis catheter, subsequent encounter: Secondary | ICD-10-CM | POA: Diagnosis not present

## 2017-03-16 DIAGNOSIS — I69992 Facial weakness following unspecified cerebrovascular disease: Secondary | ICD-10-CM | POA: Diagnosis not present

## 2017-03-16 DIAGNOSIS — D631 Anemia in chronic kidney disease: Secondary | ICD-10-CM | POA: Diagnosis not present

## 2017-03-16 LAB — I-STAT ARTERIAL BLOOD GAS, ED
Acid-Base Excess: 7 mmol/L — ABNORMAL HIGH (ref 0.0–2.0)
Bicarbonate: 27.6 mmol/L (ref 20.0–28.0)
O2 Saturation: 98 %
Patient temperature: 98.6
TCO2: 28 mmol/L (ref 22–32)
pCO2 arterial: 26.2 mmHg — ABNORMAL LOW (ref 32.0–48.0)
pH, Arterial: 7.631 (ref 7.350–7.450)
pO2, Arterial: 81 mmHg — ABNORMAL LOW (ref 83.0–108.0)

## 2017-03-16 LAB — COMPREHENSIVE METABOLIC PANEL
ALT: <5 U/L — ABNORMAL LOW (ref 17–63)
AST: 20 U/L (ref 15–41)
Albumin: 3.3 g/dL — ABNORMAL LOW (ref 3.5–5.0)
Alkaline Phosphatase: 86 U/L (ref 38–126)
Anion gap: 16 — ABNORMAL HIGH (ref 5–15)
BUN: 27 mg/dL — ABNORMAL HIGH (ref 6–20)
CO2: 28 mmol/L (ref 22–32)
Calcium: 8.9 mg/dL (ref 8.9–10.3)
Chloride: 90 mmol/L — ABNORMAL LOW (ref 101–111)
Creatinine, Ser: 8.39 mg/dL — ABNORMAL HIGH (ref 0.61–1.24)
GFR calc Af Amer: 7 mL/min — ABNORMAL LOW (ref >60)
GFR calc non Af Amer: 6 mL/min — ABNORMAL LOW (ref >60)
Glucose, Bld: 101 mg/dL — ABNORMAL HIGH (ref 65–99)
Potassium: 4.3 mmol/L (ref 3.5–5.1)
Sodium: 134 mmol/L — ABNORMAL LOW (ref 135–145)
Total Bilirubin: 0.5 mg/dL (ref 0.3–1.2)
Total Protein: 8.2 g/dL — ABNORMAL HIGH (ref 6.5–8.1)

## 2017-03-16 LAB — I-STAT CHEM 8, ED
BUN: 28 mg/dL — ABNORMAL HIGH (ref 6–20)
Calcium, Ion: 0.94 mmol/L — ABNORMAL LOW (ref 1.15–1.40)
Chloride: 95 mmol/L — ABNORMAL LOW (ref 101–111)
Creatinine, Ser: 9.2 mg/dL — ABNORMAL HIGH (ref 0.61–1.24)
Glucose, Bld: 103 mg/dL — ABNORMAL HIGH (ref 65–99)
HCT: 50 % (ref 39.0–52.0)
Hemoglobin: 17 g/dL (ref 13.0–17.0)
Potassium: 4.3 mmol/L (ref 3.5–5.1)
Sodium: 135 mmol/L (ref 135–145)
TCO2: 27 mmol/L (ref 22–32)

## 2017-03-16 LAB — CK: Total CK: 136 U/L (ref 49–397)

## 2017-03-16 LAB — CBC
HCT: 41 % (ref 39.0–52.0)
Hemoglobin: 13.8 g/dL (ref 13.0–17.0)
MCH: 32.5 pg (ref 26.0–34.0)
MCHC: 33.7 g/dL (ref 30.0–36.0)
MCV: 96.7 fL (ref 78.0–100.0)
Platelets: 122 10*3/uL — ABNORMAL LOW (ref 150–400)
RBC: 4.24 MIL/uL (ref 4.22–5.81)
RDW: 16.1 % — ABNORMAL HIGH (ref 11.5–15.5)
WBC: 6.2 10*3/uL (ref 4.0–10.5)

## 2017-03-16 LAB — I-STAT TROPONIN, ED: Troponin i, poc: 0.1 ng/mL (ref 0.00–0.08)

## 2017-03-16 LAB — GLUCOSE, CAPILLARY: Glucose-Capillary: 103 mg/dL — ABNORMAL HIGH (ref 65–99)

## 2017-03-16 LAB — APTT: aPTT: 44 seconds — ABNORMAL HIGH (ref 24–36)

## 2017-03-16 LAB — DIFFERENTIAL
Basophils Absolute: 0 10*3/uL (ref 0.0–0.1)
Basophils Relative: 0 %
Eosinophils Absolute: 0.1 10*3/uL (ref 0.0–0.7)
Eosinophils Relative: 2 %
Lymphocytes Relative: 25 %
Lymphs Abs: 1.6 10*3/uL (ref 0.7–4.0)
Monocytes Absolute: 0.5 10*3/uL (ref 0.1–1.0)
Monocytes Relative: 8 %
Neutro Abs: 4 10*3/uL (ref 1.7–7.7)
Neutrophils Relative %: 65 %

## 2017-03-16 LAB — PROTIME-INR
INR: 1.4
Prothrombin Time: 17.1 seconds — ABNORMAL HIGH (ref 11.4–15.2)

## 2017-03-16 LAB — PHOSPHORUS: Phosphorus: 5.2 mg/dL — ABNORMAL HIGH (ref 2.5–4.6)

## 2017-03-16 LAB — TROPONIN I
Troponin I: 0.33 ng/mL (ref <0.03)
Troponin I: 0.32 ng/mL (ref <0.03)

## 2017-03-16 LAB — MAGNESIUM: Magnesium: 2.3 mg/dL (ref 1.7–2.4)

## 2017-03-16 LAB — CBG MONITORING, ED: Glucose-Capillary: 98 mg/dL (ref 65–99)

## 2017-03-16 MED ORDER — CHLORHEXIDINE GLUCONATE 0.12 % MT SOLN
15.0000 mL | Freq: Two times a day (BID) | OROMUCOSAL | Status: DC
  Administered 2017-03-17: 15 mL via OROMUCOSAL
  Filled 2017-03-16 (×2): qty 15

## 2017-03-16 MED ORDER — CALCIUM ACETATE (PHOS BINDER) 667 MG PO CAPS
2001.0000 mg | ORAL_CAPSULE | Freq: Three times a day (TID) | ORAL | Status: DC
Start: 2017-03-16 — End: 2017-03-17
  Administered 2017-03-16 – 2017-03-17 (×3): 2001 mg via ORAL
  Filled 2017-03-16 (×3): qty 3

## 2017-03-16 MED ORDER — SEVELAMER CARBONATE 800 MG PO TABS: 1600.0000 mg | ORAL_TABLET | ORAL | Status: DC

## 2017-03-16 MED ORDER — CINACALCET HCL 30 MG PO TABS
90.0000 mg | ORAL_TABLET | Freq: Every day | ORAL | Status: DC
  Administered 2017-03-16: 90 mg via ORAL
  Filled 2017-03-16 (×2): qty 3

## 2017-03-16 MED ORDER — VITAMIN D 1000 UNITS PO TABS
1000.0000 [IU] | ORAL_TABLET | Freq: Every day | ORAL | Status: DC
  Administered 2017-03-16 – 2017-03-17 (×2): 1000 [IU] via ORAL
  Filled 2017-03-16 (×2): qty 1

## 2017-03-16 MED ORDER — MIDODRINE HCL 5 MG PO TABS
10.0000 mg | ORAL_TABLET | Freq: Three times a day (TID) | ORAL | Status: DC
  Administered 2017-03-17 (×2): 10 mg via ORAL
  Filled 2017-03-16 (×2): qty 2

## 2017-03-16 MED ORDER — WARFARIN - PHARMACIST DOSING INPATIENT
Freq: Every day | Status: DC
  Administered 2017-03-16: 23:00:00

## 2017-03-16 MED ORDER — ONDANSETRON HCL 4 MG/2ML IJ SOLN
4.0000 mg | Freq: Four times a day (QID) | INTRAMUSCULAR | Status: DC | PRN
Start: 2017-03-16 — End: 2017-03-17

## 2017-03-16 MED ORDER — ONDANSETRON HCL 4 MG PO TABS: 4.0000 mg | ORAL_TABLET | Freq: Four times a day (QID) | ORAL | Status: DC | PRN

## 2017-03-16 MED ORDER — LORAZEPAM 2 MG/ML IJ SOLN
0.5000 mg | Freq: Once | INTRAMUSCULAR | Status: AC
  Administered 2017-03-16: 0.5 mg via INTRAVENOUS
  Filled 2017-03-16: qty 1

## 2017-03-16 MED ORDER — LORAZEPAM 2 MG/ML IJ SOLN
2.0000 mg | Freq: Four times a day (QID) | INTRAMUSCULAR | Status: DC | PRN
  Administered 2017-03-16: 2 mg via INTRAVENOUS
  Filled 2017-03-16: qty 1

## 2017-03-16 MED ORDER — NEPRO/CARBSTEADY PO LIQD
237.0000 mL | ORAL | Status: DC
  Administered 2017-03-17: 237 mL via ORAL
  Filled 2017-03-16: qty 237

## 2017-03-16 MED ORDER — WARFARIN SODIUM 7.5 MG PO TABS
20.0000 mg | ORAL_TABLET | Freq: Once | ORAL | Status: AC
  Administered 2017-03-16: 20 mg via ORAL
  Filled 2017-03-16: qty 1
  Filled 2017-03-16: qty 2

## 2017-03-16 MED ORDER — AMIODARONE HCL 200 MG PO TABS
100.0000 mg | ORAL_TABLET | Freq: Every day | ORAL | Status: DC
  Administered 2017-03-16 – 2017-03-17 (×2): 100 mg via ORAL
  Filled 2017-03-16 (×2): qty 1

## 2017-03-16 MED ORDER — ISOSORBIDE DINITRATE 20 MG PO TABS
20.0000 mg | ORAL_TABLET | Freq: Every day | ORAL | Status: DC
  Filled 2017-03-16: qty 1

## 2017-03-16 MED ORDER — OXYCODONE-ACETAMINOPHEN 5-325 MG PO TABS
1.0000 | ORAL_TABLET | Freq: Every day | ORAL | Status: DC | PRN
  Administered 2017-03-16: 1 via ORAL
  Filled 2017-03-16: qty 1

## 2017-03-16 NOTE — ED Notes (Signed)
Dr. Marily Memos notified of elevated troponin.

## 2017-03-16 NOTE — Progress Notes (Signed)
ANTICOAGULATION CONSULT NOTE - Initial Consult  Pharmacy Consult for warfarin Indication: atrial fibrillation  Allergies  Allergen Reactions  . Ace Inhibitors Itching and Cough    COUGH > INTOLERANCE    Patient Measurements:    Vital Signs: Temp: 97.3 F (36.3 C) (10/24 1240) Temp Source: Oral (10/24 0930) BP: 100/72 (10/24 1200) Pulse Rate: 94 (10/24 1200)  Labs:  Recent Labs  03/16/17 0900 03/16/17 0924  HGB 13.8 17.0  HCT 41.0 50.0  PLT 122*  --   APTT 44*  --   LABPROT 17.1*  --   INR 1.40  --   CREATININE 8.39* 9.20*    CrCl cannot be calculated (Unknown ideal weight.).   Medical History: Past Medical History:  Diagnosis Date  . Atrial flutter ([place])   . 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 Cardiomyopathy   . CHF (congestive heart failure) ([place])   . Coronary artery disease    normal coronaries by 10/10/08 cath, Cardiac Cath 08-04-12 epic.Dr. Doylene Canard follows  . Diabetes mellitus    pt. states he's borderline diabetic., no longer taking med,- off med. since 2013  . Dialysis patient [facility])    Mon-Wed-Fri([place])- Left AV fistula  . Diverticulitis November 2016   reoccurred in December 2016  . ESRD (end stage renal disease) ([place])   . GERD (gastroesophageal reflux disease)   . Gout   . History of nephrectomy 07/04/2012   History of right nephrectomy in 2002 for renal cell carcinoma   . History of unilateral nephrectomy   . Hypertension   . Low iron   . Myocardial infarction [facility]) ?2006  . Renal cell carcinoma    dialysis- MWF- Dr. Mercy Moore follows.  . Renal insufficiency   . Shortness of breath 05/19/11   "at rest, lying down, w/exertion"  . Stroke [facility]) 02/2011   05/19/11 denies residual  . Umbilical hernia 62/56/38   unrepaired    Medications:   (Not in a hospital admission)  Assessment: 27 YOM with h/o of ESRD on MWF HD and afib on warfarin at home. Patient here after he became unresponsive during HD  session with left facial droop. Stroke ruled out. Pharmacy consulted to resume home warfarin. INR on admission is subtherapeutic at 1.4. CHADSVASc score 6. H/H wnl. Plt 122k  Home Coumadin dose: 20 mg on MWF; 15 mg on all other days   Drug interactions: amiodarone 100 mg daily   Goal of Therapy:  INR 2-3 Monitor platelets by anticoagulation protocol: Yes   Plan:  -Warfarin 20 mg x 1 dose today -Monitor daily PT/INR  Albertina Parr, PharmD., BCPS Clinical Pharmacist Pager [phone]

## 2017-03-16 NOTE — Procedures (Signed)
History: 56 year old male with altered mental status during dialysis  Sedation: Ativan prior to the procedure  Technique: This is a 21 channel routine scalp EEG performed at the bedside with bipolar and monopolar montages arranged in accordance to the international 10/20 system of electrode placement. One channel was dedicated to EKG recording.    Background: The background consists of intermixed alpha and beta activities. There is a well defined posterior dominant rhythm of 10 hz that attenuates with eye opening. Sleep is not recorded.  Photic stimulation: Physiologic driving is not performed  EEG Abnormalities: None  Clinical Interpretation: This normal EEG is recorded in the waking state. There was no seizure or seizure predisposition recorded on this study. Please note that a normal EEG does not preclude the possibility of epilepsy.   Roland Rack, MD [facility] [phone]  If 7pm- 7am, please page neurology on call as listed in [place].

## 2017-03-16 NOTE — Progress Notes (Signed)
Notified Dr. Ralene Bathe of pt ABG.

## 2017-03-16 NOTE — ED Notes (Signed)
RT at bedside for ABG

## 2017-03-16 NOTE — Code Documentation (Signed)
56yo male arriving to [facility] via [place] at 0907.  Patient from dialysis where he was noted to have left facial droop and altered mental status at 0830.  Patient reportedly was at baseline prior to this and had received dialysis treatment for about 3 hours per EMS.  Code stroke activated by EMS.  Stroke team at the bedside on patient arrival.  Labs drawn and patient cleared for CT by Dr. Regenia Skeeter.  Patient to CT with team.  CT completed.  NIHSS 10, see documentation for details and code stroke times.  Patient unable to answer NIHSS questions correctly and bilateral arm drift and leg weakness on exam.  Patient more responsive and now responding verbally.  Patient holding his tongue to the left at times, however, no facial droop at rest.  Patient reportedly on Coumadin with INR 1.4 and PT 17.1.  No acute stroke treatment at this time.  Code stroke canceled.  Bedside handoff with ED RN Horris Latino.

## 2017-03-16 NOTE — ED Notes (Signed)
RN offered [facility] to patient for safety. Patient refused. Ambulated without assistance to bathroom in patient's room. Gait slow, steady. No distress noted.

## 2017-03-16 NOTE — ED Provider Notes (Signed)
[place] EMERGENCY DEPARTMENT Provider Note   CSN: [id number] Arrival date & time: 03/16/17  0907     History   Chief Complaint Chief Complaint  Patient presents with  . Code Stroke    HPI [name] is a 56 y.o. male.  The history is provided by the patient, the EMS personnel and the spouse. No language interpreter was used.   [name] is a 56 y.o. male who presents to the Emergency Department complaining of AMS.  Level 5 caveat due to altered mental status.  He was in his routine state of health this morning at dialysis when he was found unresponsive for an unknown clear amount of time.  EMS felt that there was some left-sided facial droop and a code stroke was activated.  On ED arrival he is nonverbal but able to weakly follow commands.  No history of similar symptoms.  He received about 3 hours of dialysis.  He completed a course of steroids 2 weeks ago for inflammation in his abdomen.  He recently saw cardiology and is scheduled for an outpatient stress test. Past Medical History:  Diagnosis Date  . Atrial flutter ([place])   . 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 Cardiomyopathy   . CHF (congestive heart failure) ([place])   . Coronary artery disease    normal coronaries by 10/10/08 cath, Cardiac Cath 08-04-12 epic.Dr. Doylene Canard follows  . Diabetes mellitus    pt. states he's borderline diabetic., no longer taking med,- off med. since 2013  . Dialysis patient [facility])    Mon-Wed-Fri([place])- Left AV fistula  . Diverticulitis November 2016   reoccurred in December 2016  . ESRD (end stage renal disease) ([place])   . GERD (gastroesophageal reflux disease)   . Gout   . History of nephrectomy 07/04/2012   History of right nephrectomy in 2002 for renal cell carcinoma   . History of unilateral nephrectomy   . Hypertension   . Low iron   . Myocardial infarction [facility]) ?2006  . Renal cell carcinoma    dialysis- MWF- Dr. Mercy Moore  follows.  . Renal insufficiency   . Shortness of breath 05/19/11   "at rest, lying down, w/exertion"  . Stroke [facility]) 02/2011   05/19/11 denies residual  . Umbilical hernia 60/73/71   unrepaired    Patient Active Problem List   Diagnosis Date Noted  . Bile leak, postoperative 03/15/2016  . Elevated troponin 03/15/2016  . Rib pain on right side   . Biliary dyskinesia 02/12/2016  . Atrial flutter with rapid ventricular response ([place]) 06/27/2015  . Diverticulitis 04/21/2015  . Acute diverticulitis 04/21/2015  . Diverticulitis of intestine without perforation or abscess without bleeding 04/21/2015  . Pulmonary edema 01/13/2015  . Right upper quadrant abdominal pain 01/13/2015  . Acute respiratory failure with hypoxia ([place]) 01/13/2015  . Diverticulitis large intestine w/o perforation or abscess w/o bleeding 12/18/2014  . Hepatic cyst 12/18/2014  . Aftercare following surgery of the circulatory system, [place] 06/22/2013  . ESRD (end stage renal disease) on dialysis ([place]) 11/28/2012  . Right sided weakness 10/05/2012  . History of nephrectomy 07/04/2012  . End-stage renal disease on hemodialysis ([place]) 06/04/2011  . Systolic CHF, chronic ([place]) 05/19/2011    Class: Acute  . Hypertension, benign 05/19/2011    Class: Chronic  . DM II (diabetes mellitus, type II), controlled ([place]) 05/19/2011    Class: Chronic  . Gout, chronic 05/19/2011  Past Surgical History:  Procedure Laterality Date  . AV FISTULA PLACEMENT  03/29/2011   Procedure: ARTERIOVENOUS (AV) FISTULA CREATION;  Surgeon: Hinda Lenis, MD;  Location: [place];  Service: Vascular;  Laterality: Left;  LEFT RADIOCEPHALIC , Arteriovenous ZOXWRUE(45409)  . CARDIAC CATHETERIZATION  05/21/11  . CARDIAC CATHETERIZATION N/A 03/16/2016   Procedure: Left Heart Cath and Coronary Angiography;  Surgeon: Dixie Dials, MD;  Location: [place] CV LAB;  Service: Cardiovascular;  Laterality: N/A;  . CHOLECYSTECTOMY N/A 02/12/2016    Procedure: LAPAROSCOPIC CHOLECYSTECTOMY WITH INTRAOPERATIVE CHOLANGIOGRAM;  Surgeon: Jackolyn Confer, MD;  Location: [place];  Service: General;  Laterality: N/A;  . COLONOSCOPY WITH PROPOFOL N/A 02/01/2017   Procedure: COLONOSCOPY WITH PROPOFOL;  Surgeon: Juanita Craver, MD;  Location: [facility] END[facility];  Service: Endoscopy;  Laterality: N/A;  . dialysis cath placed    . ESOPHAGOGASTRODUODENOSCOPY (EGD) WITH PROPOFOL N/A 12/02/2015   Procedure: ESOPHAGOGASTRODUODENOSCOPY (EGD) WITH PROPOFOL;  Surgeon: Juanita Craver, MD;  Location: [facility];  Service: Endoscopy;  Laterality: N/A;  . FINGER SURGERY     L pinkie finger- ORIF- /w remaining hardware - 1990's    . HERNIA REPAIR N/A 01/01/9146   Umbilical hernia repair  . INSERTION OF DIALYSIS CATHETER N/A 01/27/2016   Procedure: INSERTION OF DIALYSIS CATHETER;  Surgeon: Waynetta Sandy, MD;  Location: [place];  Service: Vascular;  Laterality: N/A;  . INSERTION OF MESH N/A 07/04/2012   Procedure: INSERTION OF MESH;  Surgeon: Madilyn Hook, DO;  Location: [place];  Service: General;  Laterality: N/A;  . LAPAROSCOPIC LYSIS OF ADHESIONS N/A 02/12/2016   Procedure: LAPAROSCOPIC LYSIS OF ADHESIONS;  Surgeon: Jackolyn Confer, MD;  Location: [place];  Service: General;  Laterality: N/A;  . LEFT AND RIGHT HEART CATHETERIZATION WITH CORONARY ANGIOGRAM N/A 08/04/2012   Procedure: LEFT AND RIGHT HEART CATHETERIZATION WITH CORONARY ANGIOGRAM;  Surgeon: Birdie Riddle, MD;  Location: [place] CATH LAB;  Service: Cardiovascular;  Laterality: N/A;  . NEPHRECTOMY  2000   right  . REVISON OF ARTERIOVENOUS FISTULA Left 06/05/2013   Procedure: REVISON OF LEFT RADIOCEPHALIC  ARTERIOVENOUS FISTULA;  Surgeon: Conrad Stinson Beach, MD;  Location: [place];  Service: Vascular;  Laterality: Left;  . REVISON OF ARTERIOVENOUS FISTULA Left 01/27/2016   Procedure: REVISION OF LEFT UPPER EXTREMITY ARTERIOVENOUS FISTULA;  Surgeon: Waynetta Sandy, MD;  Location: [place];  Service: Vascular;  Laterality:  Left;  . REVISON OF ARTERIOVENOUS FISTULA Left 08/03/2016   Procedure: REVISON OF Left arm ARTERIOVENOUS FISTULA;  Surgeon: Elam Dutch, MD;  Location: [place];  Service: Vascular;  Laterality: Left;  . RIGHT HEART CATHETERIZATION N/A 05/21/2011   Procedure: RIGHT HEART CATH;  Surgeon: Birdie Riddle, MD;  Location: [facility] CATH LAB;  Service: Cardiovascular;  Laterality: N/A;  . smashed  1990's   "left pinky; have a plate in there"  . UMBILICAL HERNIA REPAIR N/A 07/04/2012   Procedure: LAPAROSCOPIC UMBILICAL HERNIA;  Surgeon: Madilyn Hook, DO;  Location: [place];  Service: General;  Laterality: N/A;  . US ECHOCARDIOGRAPHY  05/20/11       Home Medications    Prior to Admission medications   Medication Sig Start Date End Date Taking? Authorizing Provider  allopurinol (ZYLOPRIM) 300 MG tablet Take 300 mg by mouth daily.  12/07/15   [provider]  amiodarone (PACERONE) 200 MG tablet Take 0.5 tablets (100 mg total) by mouth daily. 07/05/15   Dixie Dials, MD  amLODipine (NORVASC) 5 MG tablet Take 5 mg by mouth daily.  [provider]  calcium acetate (PHOSLO) 667 MG capsule Take 3 capsules by mouth 3 (three) times daily. 01/17/17   [provider]  carvedilol (COREG) 3.125 MG tablet Take 1 tablet (3.125 mg total) by mouth 2 (two) times daily with a meal. 03/21/16   Sheikh, Omair Latif, DO  chlorhexidine (PERIDEX) 0.12 % solution RINSE WITH 1/2 OZ TWICE A DAY (WITH ORAL HYGIENE ROUTINE) 06/25/16   [provider]  cholecalciferol (VITAMIN D) 1000 units tablet Take 1,000 Units by mouth daily.    [provider]  cinacalcet (SENSIPAR) 90 MG tablet Take 90 mg by mouth daily with supper.     [provider]  isosorbide dinitrate (ISORDIL) 20 MG tablet Take 20 mg by mouth daily.     [provider]  lidocaine-prilocaine (EMLA) cream Apply 1 application topically every Monday, Wednesday, and Friday. Apply to port access prior to dialysis  04/08/15   [provider]  midodrine (PROAMATINE) 10 MG tablet Take 1 tablet (10 mg total) by mouth 3 (three) times daily with meals. Patient taking differently: Take 5-10 mg by mouth See admin instructions. Pt takes 5mg  MWF, 10mg  all other days 08/03/16   Virgina Jock A, PA-C  multivitamin (RENA-VIT) TABS tablet Take 1 tablet by mouth at bedtime. 03/21/16   Raiford Noble Latif, DO  nitroGLYCERIN (NITROSTAT) 0.4 MG SL tablet Place 0.4 mg under the tongue every 5 (five) minutes as needed for chest pain.  05/20/15   [provider]  Nutritional Supplements (FEEDING SUPPLEMENT, NEPRO CARB STEADY,) LIQD Take 237 mLs by mouth 2 (two) times daily between meals. Patient taking differently: Take 237 mLs by mouth every other day.  03/21/16   Raiford Noble Latif, DO  oxyCODONE-acetaminophen (PERCOCET/ROXICET) 5-325 MG tablet Take 1 tablet by mouth daily as needed for pain. 02/17/17   [provider]  pantoprazole (PROTONIX) 40 MG tablet Take 1 tablet (40 mg total) by mouth 2 (two) times daily. 01/27/16   Alvia Grove, PA-C  predniSONE (STERAPRED UNI-PAK 21 TAB) 10 MG (21) TBPK tablet Take by mouth daily. Take 6 tabs by mouth daily  for 2 days, then 5 tabs for 2 days, then 4 tabs for 2 days, then 3 tabs for 2 days, 2 tabs for 2 days, then 1 tab by mouth daily for 2 days 02/22/17   Carmin Muskrat, MD  sevelamer carbonate (RENVELA) 800 MG tablet Take 2-3 tablets (1,600-2,400 mg total) by mouth See admin instructions. 1600 mg with snacks and 2400 mg with meals Patient not taking: Reported on 02/22/2017 08/03/16   Alvia Grove, PA-C    Family History Family History  Problem Relation Age of Onset  . Hypertension Mother   . Kidney disease Mother     Social History Social History  Substance Use Topics  . Smoking status: Never Smoker  . Smokeless tobacco: Never Used  . Alcohol use No     Allergies   Ace inhibitors   Review of Systems Review of Systems  All other  systems reviewed and are negative.    Physical Exam Updated Vital Signs BP 110/75   Pulse 94   Temp (!) 97.4 F (36.3 C) (Oral)   Resp (!) 27   SpO2 100%   Physical Exam  Constitutional: He appears well-developed and well-nourished.  HENT:  Head: Normocephalic and atraumatic.  Cardiovascular: Normal rate and regular rhythm.   No murmur heard. Pulmonary/Chest: Effort normal. No respiratory distress.  Tachypneic  Abdominal: Soft. There is no  tenderness. There is no rebound and no guarding.  Musculoskeletal: He exhibits no edema or tenderness.  2+ DP pulses in bilateral lower extremities.  There is a fistula in the left forearm with vascular access, palpable thrill.  Neurological: He is alert.  Eyes open spontaneously and blinks to threat.  Nonverbal.  Does very weakly follow commands with symmetric movements in all 4 extremities.  No appreciable facial droop.  Sensation to light touch intact in all 4 extremities.  Skin: Skin is warm and dry.  Psychiatric:  Anxious appearing, mildly tremulous  Nursing note and vitals reviewed.    ED Treatments / Results  Labs (all labs ordered are listed, but only abnormal results are displayed) Labs Reviewed  PROTIME-INR - Abnormal; Notable for the following:       Result Value   Prothrombin Time 17.1 (*)    All other components within normal limits  APTT - Abnormal; Notable for the following:    aPTT 44 (*)    All other components within normal limits  CBC - Abnormal; Notable for the following:    RDW 16.1 (*)    Platelets 122 (*)    All other components within normal limits  COMPREHENSIVE METABOLIC PANEL - Abnormal; Notable for the following:    Sodium 134 (*)    Chloride 90 (*)    Glucose, Bld 101 (*)    BUN 27 (*)    Creatinine, Ser 8.39 (*)    Total Protein 8.2 (*)    Albumin 3.3 (*)    ALT <5 (*)    GFR calc non Af Amer 6 (*)    GFR calc Af Amer 7 (*)    Anion gap 16 (*)    All other components within normal limits    I-STAT TROPONIN, ED - Abnormal; Notable for the following:    Troponin i, poc 0.10 (*)    All other components within normal limits  I-STAT CHEM 8, ED - Abnormal; Notable for the following:    Chloride 95 (*)    BUN 28 (*)    Creatinine, Ser 9.20 (*)    Glucose, Bld 103 (*)    Calcium, Ion 0.94 (*)    All other components within normal limits  I-STAT ARTERIAL BLOOD GAS, ED - Abnormal; Notable for the following:    pH, Arterial 7.631 (*)    pCO2 arterial 26.2 (*)    pO2, Arterial 81.0 (*)    Acid-Base Excess 7.0 (*)    All other components within normal limits  DIFFERENTIAL  CBG MONITORING, ED    EKG  EKG Interpretation  Date/Time:  Wednesday March 16 2017 09:26:08 EDT Ventricular Rate:  97 PR Interval:    QRS Duration: 108 QT Interval:  392 QTC Calculation: 498 R Axis:   -22 Text Interpretation:  Sinus rhythm LVH with secondary repolarization abnormality Borderline prolonged QT interval Confirmed by Quintella Reichert [phone]) on 03/16/2017 9:29:28 AM       Radiology Dg Chest Port 1 View  Result Date: 03/16/2017 CLINICAL DATA:  Code stroke EXAM: PORTABLE CHEST 1 VIEW COMPARISON:  None. FINDINGS: Cardiomegaly. Mediastinal shadows unremarkable. The lungs are clear. No edema, infiltrate or effusion. IMPRESSION: Cardiomegaly.  No active disease. Electronically Signed   By: Nelson Chimes M.D.   On: 03/16/2017 10:19   Ct Head Code Stroke Wo Contrast  Result Date: 03/16/2017 CLINICAL DATA:  Code stroke. Code stroke. Decreased responsiveness. EXAM: CT HEAD WITHOUT CONTRAST TECHNIQUE: Contiguous axial images were obtained from the base  of the skull through the vertex without intravenous contrast. COMPARISON:  CT head without contrast 10/11/2012 FINDINGS: Brain: No acute infarct, hemorrhage, or mass lesion is present. The ventricles are of normal size. Mild white matter changes are stable. Remote lacunar infarcts of the pons are again noted. The brainstem is unremarkable. Vascular:  Atherosclerotic calcifications are present within the cavernous internal carotid arteries. There is no hyperdense vessel. Skull: Calvarium is intact. No focal lytic or blastic lesions are present. Sinuses/Orbits: The paranasal sinuses and mastoid air cells are clear. The globes and orbits are within normal limits bilaterally. ASPECTS [facility] Stroke Program Early CT Score) - Ganglionic level infarction (caudate, lentiform nuclei, internal capsule, insula, M1-M3 cortex): 7/7 - Supraganglionic infarction (M4-M6 cortex): 3/3 Total score (0-10 with 10 being normal): 10/10 IMPRESSION: 1. No acute intracranial abnormality or significant interval change. 2. Stable mild white matter disease. 3. Remote lacunar infarct of the brainstem are stable 4. ASPECTS is 10/10 These results were communicated by text page at the time of interpretation on 03/16/2017 at 9:24 am to Dr. Leonel Ramsay. Electronically Signed   By: San Morelle M.D.   On: 03/16/2017 09:25    Procedures Procedures (including critical care time)  Medications Ordered in ED Medications  LORazepam (ATIVAN) injection 0.5 mg (0.5 mg Intravenous Given 03/16/17 0948)     Initial Impression / Assessment and Plan / ED Course  I have reviewed the triage vital signs and the nursing notes.  Pertinent labs & imaging results that were available during my care of the patient were reviewed by me and considered in my medical decision making (see chart for details).     Patient presented by EMS as a code stroke from dialysis following an unresponsive episode.  Patient is very anxious appearing with no focal neurologic deficits.  He was treated with Ativan for possible anxiety versus seizure.  On repeat assessment he continues to look anxious but does have slight improvement in his strength and he is able to communicate quietly with his wife.  Hospitalist consulted for admission for change in mental status, syncope versus seizure.  Final Clinical  Impressions(s) / ED Diagnoses   Final diagnoses:  None    New Prescriptions New Prescriptions   No medications on file     Quintella Reichert, MD 03/16/17 1517

## 2017-03-16 NOTE — Consult Note (Signed)
Neurology Consultation Reason for Consult: Decreased responsiveness Referring Physician: Ralene Bathe, E  CC: Shortness of breath  History is obtained from:patient  HPI: [name] is a 56 y.o. male with a history of aflutter, chf who has been having some symptoms(unclear to me what) that promted his primary cardiologist yesterday to order a stress test.   He went ot dialysis today and was in treatmetn when he developed decreased responsiveness. Due to this, a code stroke was activated and he was brought by EMS.   LKW: 5am tpa given?: no, rapidly improving symptoms.  ICH Score:    ROS: A 14 point ROS was performed and is negative except as noted in the HPI.   Past Medical History:  Diagnosis Date  . Atrial flutter ([place])   . 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 Cardiomyopathy   . CHF (congestive heart failure) ([place])   . Coronary artery disease    normal coronaries by 10/10/08 cath, Cardiac Cath 08-04-12 epic.Dr. Doylene Canard follows  . Diabetes mellitus    pt. states he's borderline diabetic., no longer taking med,- off med. since 2013  . Dialysis patient [facility])    Mon-Wed-Fri([place])- Left AV fistula  . Diverticulitis November 2016   reoccurred in December 2016  . ESRD (end stage renal disease) ([place])   . GERD (gastroesophageal reflux disease)   . Gout   . History of nephrectomy 07/04/2012   History of right nephrectomy in 2002 for renal cell carcinoma   . History of unilateral nephrectomy   . Hypertension   . Low iron   . Myocardial infarction [facility]) ?2006  . Renal cell carcinoma    dialysis- MWF- Dr. Mercy Moore follows.  . Renal insufficiency   . Shortness of breath 05/19/11   "at rest, lying down, w/exertion"  . Stroke [facility]) 02/2011   05/19/11 denies residual  . Umbilical hernia 32/67/12   unrepaired     Family History  Problem Relation Age of Onset  . Hypertension Mother   . Kidney disease Mother      Social History:  reports that  he has never smoked. He has never used smokeless tobacco. He reports that he does not drink alcohol or use drugs.   Exam: Current vital signs: Vitals:   03/16/17 0930 03/16/17 0934  BP:  114/73  Pulse: 95   Resp: (!) 34   Temp: (!) 97.4 F (36.3 C)   SpO2: 100%    Vital signs in last 24 hours:   Physical Exam  Constitutional: Appears obese Psych: Affect appropriate to situation Eyes: No scleral injection HENT: No OP obstrucion Head: Normocephalic.  Cardiovascular: Normal rate and regular rhythm.  Respiratory: pursed lip breathing. GI: Soft.  No distension. There is no tenderness.  Skin: WDI  Neuro: Mental Status: Patient is awake, alert, oriented to person, month, age He is too SOB to give a clear history.  No signs of  Neglect. He has variable speech output, sometimes with stuttering. He holds his tongue out of his mouth during much f the time as well. Cranial Nerves: II: Visual Fields are full. Pupils are equal, round, and reactive to light.   III,IV, VI: EOMI without ptosis or diploplia.  V: Facial sensation is symmetric to temperature VII: Facial movement is symmetric.  VIII: hearing is intact to voice X: Uvula elevates symmetrically XI: Shoulder shrug is symmetric. XII: tongue is midline without atrophy or fasciculations.  Motor: He has symmetric weakness, will not  lift legs out of bed, but will hold arms aloft for 10 seconds.  Sensory: Endorses symmetric sensation.  Cerebellar: FNF and HKS are intact bilaterally   I have reviewed labs in epic and the results pertinent to this consultation are: Cr 9.2  I have reviewed the images obtained: CT head - no acute findings.   Impression: 56 yo M with altered metnal status in the setting of dialysis. Some of his exam seems non-organic in etiology, but difficult to be sure. There is no focality to suggest stroke, and he is improving, so I would not favor  tPA. Seizure could be a possibility, but no motor activity  was reported.    At this time, I would favor functional presentation vs dialysis dysequilibrium syndrome.   With his tachypnea, could consider hypercarbia as well.    Recommendations: 1) ABG 2) EEG 3) If he remains altered, MRI brain 4) neurology will continue to follow.    Roland Rack, MD [facility] [phone]  If 7pm- 7am, please page neurology on call as listed in [place].

## 2017-03-16 NOTE — Progress Notes (Signed)
EEG completed; results pending.    

## 2017-03-16 NOTE — ED Notes (Signed)
Patient currently in MRI. Report called to [address]/Tasha, RN. Patient passed stroke swallow screen. AOx4. Ambulates to restroom. Family at bedside.

## 2017-03-16 NOTE — H&P (Addendum)
History and Physical    [name] OBS:[id number] DOB: [birth year] DOA: 03/16/2017  PCP: Iona Beard, MD Patient coming from: dialysis  Chief Complaint: unresponsiveness  HPI: [name] is a 56 y.o. male with medical history significant of atrial flutter, right eye blindness, CHF, CAD, diabetes, ESRD on dialysis Monday Wednesday Friday, GERD, MI, renal cell carcinoma, CVA without residual deficits from 2012.  Bilateral 5 caveat applies this patient is unable to provide reliable history at time of admission. No family available. History provided in part by patient, EDP report, and EMS report.  Patient was in his normal state of health until approximately 3 hours into his dialysis session when he became unresponsive. No additional reports of possible left facial droop. The symptoms started at approximately 08:30. At time of arrival patient was reportedly nonresponsive but had no facial droop noted. Code stroke was called and neurology evaluated patient at time of arrival.  Report there are no recent reports of fevers, chest pain, shortness of breath, palpitations, flank pain, other focal neurological deficits, headache.  At time of my examination patient is awake but has a raspy dry voice. His only complaint at this point time is bilateral lower extremity crampy pain and heaviness. Worse with movement of feet and legs. Has had this problem in the past but this is worse than typical. Patient is unaware of any of his activities from onset of symptoms during dialysis until time of my evaluation.  ED Course: Code stroke called. Objective findings outlined below. Neurology consult in chart. Ativan given by EDP due to patient being very anxious and hyperventilating.  Review of Systems: As per HPI otherwise all other systems reviewed and are negative  Ambulatory Status: No restrictions.  Past Medical History:  Diagnosis Date  . Atrial flutter ([place])   . Blind right eye    Hx: of partial  blindness in right eye  . Cardiomyopathy   . CHF (congestive heart failure) ([place])   . Coronary artery disease    normal coronaries by 10/10/08 cath, Cardiac Cath 08-04-12 epic.Dr. Doylene Canard follows  . Diabetes mellitus    pt. states he's borderline diabetic., no longer taking med,- off med. since 2013  . Dialysis patient [facility])    Mon-Wed-Fri([place])- Left AV fistula  . Diverticulitis November 2016   reoccurred in December 2016  . ESRD (end stage renal disease) ([place])   . GERD (gastroesophageal reflux disease)   . Gout   . History of nephrectomy 07/04/2012   History of right nephrectomy in 2002 for renal cell carcinoma   . History of unilateral nephrectomy   . Hypertension   . Low iron   . Myocardial infarction [facility]) ?2006  . Renal cell carcinoma    dialysis- MWF- Dr. Mercy Moore follows.  . Renal insufficiency   . Shortness of breath 05/19/11   "at rest, lying down, w/exertion"  . Stroke [facility]) 02/2011   05/19/11 denies residual  . Umbilical hernia 47/65/46   unrepaired    Past Surgical History:  Procedure Laterality Date  . AV FISTULA PLACEMENT  03/29/2011   Procedure: ARTERIOVENOUS (AV) FISTULA CREATION;  Surgeon: Hinda Lenis, MD;  Location: [place];  Service: Vascular;  Laterality: Left;  LEFT RADIOCEPHALIC , Arteriovenous [id number])  . CARDIAC CATHETERIZATION  05/21/11  . CARDIAC CATHETERIZATION N/A 03/16/2016   Procedure: Left Heart Cath and Coronary Angiography;  Surgeon: Dixie Dials, MD;  Location: [place] CV LAB;  Service: Cardiovascular;  Laterality: N/A;  . CHOLECYSTECTOMY N/A 02/12/2016  Procedure: LAPAROSCOPIC CHOLECYSTECTOMY WITH INTRAOPERATIVE CHOLANGIOGRAM;  Surgeon: Jackolyn Confer, MD;  Location: [place];  Service: General;  Laterality: N/A;  . COLONOSCOPY WITH PROPOFOL N/A 02/01/2017   Procedure: COLONOSCOPY WITH PROPOFOL;  Surgeon: Juanita Craver, MD;  Location: [facility];  Service: Endoscopy;  Laterality: N/A;  . dialysis cath placed      . ESOPHAGOGASTRODUODENOSCOPY (EGD) WITH PROPOFOL N/A 12/02/2015   Procedure: ESOPHAGOGASTRODUODENOSCOPY (EGD) WITH PROPOFOL;  Surgeon: Juanita Craver, MD;  Location: [facility];  Service: Endoscopy;  Laterality: N/A;  . FINGER SURGERY     L pinkie finger- ORIF- /w remaining hardware - 1990's    . HERNIA REPAIR N/A 1/61/0960   Umbilical hernia repair  . INSERTION OF DIALYSIS CATHETER N/A 01/27/2016   Procedure: INSERTION OF DIALYSIS CATHETER;  Surgeon: Waynetta Sandy, MD;  Location: [place];  Service: Vascular;  Laterality: N/A;  . INSERTION OF MESH N/A 07/04/2012   Procedure: INSERTION OF MESH;  Surgeon: Madilyn Hook, DO;  Location: [place];  Service: General;  Laterality: N/A;  . LAPAROSCOPIC LYSIS OF ADHESIONS N/A 02/12/2016   Procedure: LAPAROSCOPIC LYSIS OF ADHESIONS;  Surgeon: Jackolyn Confer, MD;  Location: [place];  Service: General;  Laterality: N/A;  . LEFT AND RIGHT HEART CATHETERIZATION WITH CORONARY ANGIOGRAM N/A 08/04/2012   Procedure: LEFT AND RIGHT HEART CATHETERIZATION WITH CORONARY ANGIOGRAM;  Surgeon: Birdie Riddle, MD;  Location: [place] CATH LAB;  Service: Cardiovascular;  Laterality: N/A;  . NEPHRECTOMY  2000   right  . REVISON OF ARTERIOVENOUS FISTULA Left 06/05/2013   Procedure: REVISON OF LEFT RADIOCEPHALIC  ARTERIOVENOUS FISTULA;  Surgeon: Conrad Salome, MD;  Location: [place];  Service: Vascular;  Laterality: Left;  . REVISON OF ARTERIOVENOUS FISTULA Left 01/27/2016   Procedure: REVISION OF LEFT UPPER EXTREMITY ARTERIOVENOUS FISTULA;  Surgeon: Waynetta Sandy, MD;  Location: [place];  Service: Vascular;  Laterality: Left;  . REVISON OF ARTERIOVENOUS FISTULA Left 08/03/2016   Procedure: REVISON OF Left arm ARTERIOVENOUS FISTULA;  Surgeon: Elam Dutch, MD;  Location: [place];  Service: Vascular;  Laterality: Left;  . RIGHT HEART CATHETERIZATION N/A 05/21/2011   Procedure: RIGHT HEART CATH;  Surgeon: Birdie Riddle, MD;  Location: [facility] CATH LAB;  Service: Cardiovascular;   Laterality: N/A;  . smashed  1990's   "left pinky; have a plate in there"  . UMBILICAL HERNIA REPAIR N/A 07/04/2012   Procedure: LAPAROSCOPIC UMBILICAL HERNIA;  Surgeon: Madilyn Hook, DO;  Location: [place];  Service: General;  Laterality: N/A;  . US ECHOCARDIOGRAPHY  05/20/11    Social History   Social History  . Marital status: Married    Spouse name: N/A  . Number of children: N/A  . Years of education: N/A   Occupational History  . Not on file.   Social History Main Topics  . Smoking status: Never Smoker  . Smokeless tobacco: Never Used  . Alcohol use No  . Drug use: No     Comment: Past hx-none in 20 yrs  . Sexual activity: Yes   Other Topics Concern  . Not on file   Social History Narrative  . No narrative on file    Allergies  Allergen Reactions  . Ace Inhibitors Itching and Cough    COUGH > INTOLERANCE    Family History  Problem Relation Age of Onset  . Hypertension Mother   . Kidney disease Mother       Prior to Admission medications   Medication Sig Start Date End Date Taking?  Authorizing Provider  allopurinol (ZYLOPRIM) 300 MG tablet Take 300 mg by mouth daily.  12/07/15   [provider]  amiodarone (PACERONE) 200 MG tablet Take 0.5 tablets (100 mg total) by mouth daily. 07/05/15   Dixie Dials, MD  amLODipine (NORVASC) 5 MG tablet Take 5 mg by mouth daily.     [provider]  calcium acetate (PHOSLO) 667 MG capsule Take 3 capsules by mouth 3 (three) times daily. 01/17/17   [provider]  carvedilol (COREG) 3.125 MG tablet Take 1 tablet (3.125 mg total) by mouth 2 (two) times daily with a meal. 03/21/16   Sheikh, Omair Latif, DO  chlorhexidine (PERIDEX) 0.12 % solution RINSE WITH 1/2 OZ TWICE A DAY (WITH ORAL HYGIENE ROUTINE) 06/25/16   [provider]  cholecalciferol (VITAMIN D) 1000 units tablet Take 1,000 Units by mouth daily.    [provider]  cinacalcet (SENSIPAR) 90 MG tablet Take 90 mg by mouth daily  with supper.     [provider]  isosorbide dinitrate (ISORDIL) 20 MG tablet Take 20 mg by mouth daily.     [provider]  lidocaine-prilocaine (EMLA) cream Apply 1 application topically every Monday, Wednesday, and Friday. Apply to port access prior to dialysis 04/08/15   [provider]  midodrine (PROAMATINE) 10 MG tablet Take 1 tablet (10 mg total) by mouth 3 (three) times daily with meals. Patient taking differently: Take 5-10 mg by mouth See admin instructions. Pt takes 5mg  MWF, 10mg  all other days 08/03/16   Virgina Jock A, PA-C  multivitamin (RENA-VIT) TABS tablet Take 1 tablet by mouth at bedtime. 03/21/16   Raiford Noble Latif, DO  nitroGLYCERIN (NITROSTAT) 0.4 MG SL tablet Place 0.4 mg under the tongue every 5 (five) minutes as needed for chest pain.  05/20/15   [provider]  Nutritional Supplements (FEEDING SUPPLEMENT, NEPRO CARB STEADY,) LIQD Take 237 mLs by mouth 2 (two) times daily between meals. Patient taking differently: Take 237 mLs by mouth every other day.  03/21/16   Raiford Noble Latif, DO  oxyCODONE-acetaminophen (PERCOCET/ROXICET) 5-325 MG tablet Take 1 tablet by mouth daily as needed for pain. 02/17/17   [provider]  pantoprazole (PROTONIX) 40 MG tablet Take 1 tablet (40 mg total) by mouth 2 (two) times daily. 01/27/16   Alvia Grove, PA-C  predniSONE (STERAPRED UNI-PAK 21 TAB) 10 MG (21) TBPK tablet Take by mouth daily. Take 6 tabs by mouth daily  for 2 days, then 5 tabs for 2 days, then 4 tabs for 2 days, then 3 tabs for 2 days, 2 tabs for 2 days, then 1 tab by mouth daily for 2 days 02/22/17   Carmin Muskrat, MD  sevelamer carbonate (RENVELA) 800 MG tablet Take 2-3 tablets (1,600-2,400 mg total) by mouth See admin instructions. 1600 mg with snacks and 2400 mg with meals Patient not taking: Reported on 02/22/2017 08/03/16   Alvia Grove, PA-C    Physical Exam: Vitals:   03/16/17 1015 03/16/17 1030 03/16/17  1115 03/16/17 1200  BP: 104/68 101/68 110/74 100/72  Pulse: 95 93 (!) 103 94  Resp: (!) 22 (!) 29 16 19   Temp:      TempSrc:      SpO2: 100% 100% 99% 100%     General: Appears anxious, resting in bed. Eyes:  PERRL, EOMI, normal lids, iris ENT: Dry mucous membrane, normal hearing Neck:  no LAD, masses or thyromegaly Cardiovascular: 1+ distal pulses, regular rate and rhythm, no lower  extremity edema Respiratory:  CTA bilaterally, no w/r/r. Normal respiratory effort. Abdomen:  soft, ntnd, NABS Skin:  no rash or induration seen on limited exam Musculoskeletal:  grossly normal tone BUE/BLE, good ROM, no bony abnormality Psychiatric: Patient seems somewhat anxious or worried. Answers questions appropriately. Alert and oriented 3. Neurologic:  CN 2-12 grossly intact, moves all extremities in coordinated fashion, sensation intact  Labs on Admission: I have personally reviewed following labs and imaging studies  CBC:  Recent Labs Lab 03/16/17 0900 03/16/17 0924  WBC 6.2  --   NEUTROABS 4.0  --   HGB 13.8 17.0  HCT 41.0 50.0  MCV 96.7  --   PLT 122*  --    Basic Metabolic Panel:  Recent Labs Lab 03/16/17 0900 03/16/17 0924  NA 134* 135  K 4.3 4.3  CL 90* 95*  CO2 28  --   GLUCOSE 101* 103*  BUN 27* 28*  CREATININE 8.39* 9.20*  CALCIUM 8.9  --    GFR: CrCl cannot be calculated (Unknown ideal weight.). Liver Function Tests:  Recent Labs Lab 03/16/17 0900  AST 20  ALT <5*  ALKPHOS 86  BILITOT 0.5  PROT 8.2*  ALBUMIN 3.3*   No results for input(s): LIPASE, AMYLASE in the last 168 hours. No results for input(s): AMMONIA in the last 168 hours. Coagulation Profile:  Recent Labs Lab 03/16/17 0900  INR 1.40   Cardiac Enzymes: No results for input(s): CKTOTAL, CKMB, CKMBINDEX, TROPONINI in the last 168 hours. BNP (last 3 results) No results for input(s): PROBNP in the last 8760 hours. HbA1C: No results for input(s): HGBA1C in the last 72  hours. CBG:  Recent Labs Lab 03/16/17 0912  GLUCAP 98   Lipid Profile: No results for input(s): CHOL, HDL, LDLCALC, TRIG, CHOLHDL, LDLDIRECT in the last 72 hours. Thyroid Function Tests: No results for input(s): TSH, T4TOTAL, FREET4, T3FREE, THYROIDAB in the last 72 hours. Anemia Panel: No results for input(s): VITAMINB12, FOLATE, FERRITIN, TIBC, IRON, RETICCTPCT in the last 72 hours. Urine analysis:    Component Value Date/Time   COLORURINE YELLOW 05/19/2011 [place] 05/19/2011 1042   LABSPEC 1.013 05/19/2011 1042   PHURINE 6.5 05/19/2011 1042   GLUCOSEU NEGATIVE 05/19/2011 1042   HGBUR TRACE (A) 05/19/2011 1042   BILIRUBINUR NEGATIVE 05/19/2011 1042   KETONESUR NEGATIVE 05/19/2011 1042   PROTEINUR >300 (A) 05/19/2011 1042   UROBILINOGEN 0.2 05/19/2011 1042   NITRITE NEGATIVE 05/19/2011 1042   LEUKOCYTESUR NEGATIVE 05/19/2011 1042    Creatinine Clearance: CrCl cannot be calculated (Unknown ideal weight.).  Sepsis Labs: @LABRCNTIP (procalcitonin:4,lacticidven:4) )No results found for this or any previous visit (from the past 240 hour(s)).   Radiological Exams on Admission: Dg Chest Port 1 View  Result Date: 03/16/2017 CLINICAL DATA:  Code stroke EXAM: PORTABLE CHEST 1 VIEW COMPARISON:  None. FINDINGS: Cardiomegaly. Mediastinal shadows unremarkable. The lungs are clear. No edema, infiltrate or effusion. IMPRESSION: Cardiomegaly.  No active disease. Electronically Signed   By: Nelson Chimes M.D.   On: 03/16/2017 10:19   Ct Head Code Stroke Wo Contrast  Result Date: 03/16/2017 CLINICAL DATA:  Code stroke. Code stroke. Decreased responsiveness. EXAM: CT HEAD WITHOUT CONTRAST TECHNIQUE: Contiguous axial images were obtained from the base of the skull through the vertex without intravenous contrast. COMPARISON:  CT head without contrast 10/11/2012 FINDINGS: Brain: No acute infarct, hemorrhage, or mass lesion is present. The ventricles are of normal size. Mild  white matter changes are stable. Remote lacunar infarcts of the  pons are again noted. The brainstem is unremarkable. Vascular: Atherosclerotic calcifications are present within the cavernous internal carotid arteries. There is no hyperdense vessel. Skull: Calvarium is intact. No focal lytic or blastic lesions are present. Sinuses/Orbits: The paranasal sinuses and mastoid air cells are clear. The globes and orbits are within normal limits bilaterally. ASPECTS [facility] Stroke Program Early CT Score) - Ganglionic level infarction (caudate, lentiform nuclei, internal capsule, insula, M1-M3 cortex): 7/7 - Supraganglionic infarction (M4-M6 cortex): 3/3 Total score (0-10 with 10 being normal): 10/10 IMPRESSION: 1. No acute intracranial abnormality or significant interval change. 2. Stable mild white matter disease. 3. Remote lacunar infarct of the brainstem are stable 4. ASPECTS is 10/10 These results were communicated by text page at the time of interpretation on 03/16/2017 at 9:24 am to Dr. Leonel Ramsay. Electronically Signed   By: [place] Morelle M.D.   On: 03/16/2017 09:25    EKG: Independently reviewed. Sinus. No ACS.  Assessment/Plan Active Problems:   Systolic CHF, chronic (HCC)   End-stage renal disease on hemodialysis (HCC)   Elevated troponin   Unresponsiveness   Hypotension   Atrial flutter (HCC)   Chronic pain   Leg pain, bilateral   Unresponsiveness: Resolving. Strong suspicion for psychosomatic or panic attack. Pt w/o any memory from time of onset of sx in dialysis until time of admission. Doubt szr given nml EEG in ED. Doubt stroke as non-focal and head CT nml. Question hypotensive and metabolic shift episode as pts BP remains soft and sx started 3hrs into dialysis and complaining of bilat LE muscle cramping. May also have been related to arrhythmia given h/o Afib/flutter (though no reported abnormalities in rhythm strip during dialysis or by EMS). Pt ABG showing respiratory alkylosis and  was drawn when pt was very anxious and hyperventilating.  - Tele, neuro checks - Ativan PRN - Psych eval in am if continues - Neuro following - No further imaging at this time given nml neuro exam.   Hypotension: on Midodrine but per pharmacy pt not taking 10mg  TID as prescribed. Taking 5mg  MWF and 10mg  other days.  - Midodrine 10mg  TID  Bilat LE pain: cramping from chronic pain w/o opioid vs metabolic changes vs muscle strain vs psychosomatic. Le are warm and well perfused so doubt PVD. No reports of claudication.  - Monitor  ESRD: dialysis M/W/F. Made it through 3 hrs of dialysis on day of admission.  - dialysis per renal - Continue renal vitamins  Elevated Troponin: 0.1. Likely baseline. EKG w/o ACS. Trend in past has been around 0.2. - Tele - cycle trop to ensure nml elevation  Afib/flutter/CHF: last Echo shoewing EF 20%. No acute decompensation and currently pt in sinus.  - continue amiodarone, Isordil - continue coumadin  Chronic pain: - continue percocet  DVT prophylaxis: coumadin  Code Status: full  Family Communication: none  Disposition Plan: pending return to baseline mentation  Consults called: Neuro  Admission status: observation    Waldemar Dickens MD [facility]  If 7PM-7AM, please contact night-coverage [email or web address] Password [facility]  03/16/2017, 12:23 PM

## 2017-03-17 ENCOUNTER — Observation Stay (HOSPITAL_BASED_OUTPATIENT_CLINIC_OR_DEPARTMENT_OTHER): Payer: Medicare Other

## 2017-03-17 DIAGNOSIS — R4189 Other symptoms and signs involving cognitive functions and awareness: Secondary | ICD-10-CM | POA: Diagnosis not present

## 2017-03-17 DIAGNOSIS — N186 End stage renal disease: Secondary | ICD-10-CM | POA: Diagnosis not present

## 2017-03-17 DIAGNOSIS — Z992 Dependence on renal dialysis: Secondary | ICD-10-CM | POA: Diagnosis not present

## 2017-03-17 DIAGNOSIS — I4892 Unspecified atrial flutter: Secondary | ICD-10-CM | POA: Diagnosis not present

## 2017-03-17 DIAGNOSIS — R748 Abnormal levels of other serum enzymes: Secondary | ICD-10-CM | POA: Diagnosis not present

## 2017-03-17 DIAGNOSIS — R4182 Altered mental status, unspecified: Secondary | ICD-10-CM | POA: Diagnosis not present

## 2017-03-17 DIAGNOSIS — I953 Hypotension of hemodialysis: Secondary | ICD-10-CM

## 2017-03-17 DIAGNOSIS — I340 Nonrheumatic mitral (valve) insufficiency: Secondary | ICD-10-CM | POA: Diagnosis not present

## 2017-03-17 DIAGNOSIS — G8929 Other chronic pain: Secondary | ICD-10-CM | POA: Diagnosis not present

## 2017-03-17 LAB — CBC
HCT: 37.9 % — ABNORMAL LOW (ref 39.0–52.0)
Hemoglobin: 12.4 g/dL — ABNORMAL LOW (ref 13.0–17.0)
MCH: 32.1 pg (ref 26.0–34.0)
MCHC: 32.7 g/dL (ref 30.0–36.0)
MCV: 98.2 fL (ref 78.0–100.0)
Platelets: 142 10*3/uL — ABNORMAL LOW (ref 150–400)
RBC: 3.86 MIL/uL — ABNORMAL LOW (ref 4.22–5.81)
RDW: 16.2 % — ABNORMAL HIGH (ref 11.5–15.5)
WBC: 4.4 10*3/uL (ref 4.0–10.5)

## 2017-03-17 LAB — COMPREHENSIVE METABOLIC PANEL
ALT: <5 U/L — ABNORMAL LOW (ref 17–63)
AST: 18 U/L (ref 15–41)
Albumin: 2.9 g/dL — ABNORMAL LOW (ref 3.5–5.0)
Alkaline Phosphatase: 80 U/L (ref 38–126)
Anion gap: 14 (ref 5–15)
BUN: 36 mg/dL — ABNORMAL HIGH (ref 6–20)
CO2: 28 mmol/L (ref 22–32)
Calcium: 8.9 mg/dL (ref 8.9–10.3)
Chloride: 92 mmol/L — ABNORMAL LOW (ref 101–111)
Creatinine, Ser: 11.55 mg/dL — ABNORMAL HIGH (ref 0.61–1.24)
GFR calc Af Amer: 5 mL/min — ABNORMAL LOW (ref >60)
GFR calc non Af Amer: 4 mL/min — ABNORMAL LOW (ref >60)
Glucose, Bld: 96 mg/dL (ref 65–99)
Potassium: 4.6 mmol/L (ref 3.5–5.1)
Sodium: 134 mmol/L — ABNORMAL LOW (ref 135–145)
Total Bilirubin: 0.6 mg/dL (ref 0.3–1.2)
Total Protein: 7.2 g/dL (ref 6.5–8.1)

## 2017-03-17 LAB — ECHOCARDIOGRAM COMPLETE
Ao-asc: 33 cm
Area-P 1/2: 5.24 cm2
E decel time: 144 msec
FS: 24 % — AB (ref 28–44)
Height: 73 in
IVS/LV PW RATIO, ED: 1.09
LA ID, A-P, ES: 39 mm
LA diam end sys: 39 mm
LA diam index: 1.59 cm/m2
LA vol A4C: 62.2 ml
LA vol index: 25.9 mL/m2
LA vol: 63.4 mL
LV PW d: 11.7 mm — AB (ref 0.6–1.1)
LVOT area: 3.8 cm2
LVOT diameter: 22 mm
MV Dec: 144
MV pk A vel: 73.7 m/s
MV pk E vel: 45.1 m/s
P 1/2 time: 42 ms
TAPSE: 20.8 mm
Weight: 4001.6 oz

## 2017-03-17 LAB — GLUCOSE, CAPILLARY
Glucose-Capillary: 69 mg/dL (ref 65–99)
Glucose-Capillary: 105 mg/dL — ABNORMAL HIGH (ref 65–99)

## 2017-03-17 LAB — PROTIME-INR
INR: 1.73
Prothrombin Time: 20.1 seconds — ABNORMAL HIGH (ref 11.4–15.2)

## 2017-03-17 LAB — TROPONIN I: Troponin I: 0.31 ng/mL (ref <0.03)

## 2017-03-17 LAB — CORTISOL: Cortisol, Plasma: 16.5 ug/dL

## 2017-03-17 LAB — APTT: aPTT: 52 seconds — ABNORMAL HIGH (ref 24–36)

## 2017-03-17 MED ORDER — WARFARIN SODIUM 7.5 MG PO TABS: 15.0000 mg | ORAL_TABLET | Freq: Once | ORAL | Status: DC

## 2017-03-17 MED ORDER — MIDODRINE HCL 10 MG PO TABS
5.0000 mg | ORAL_TABLET | ORAL | 1 refills | Status: DC
Start: 2017-03-18 — End: 2018-01-23

## 2017-03-17 MED ORDER — MIDODRINE HCL 10 MG PO TABS: 10.0000 mg | ORAL_TABLET | Freq: Three times a day (TID) | ORAL | 1 refills | Status: DC

## 2017-03-17 NOTE — Progress Notes (Signed)
[facility] Progress Note   Subjective:   Patient presented from dialysis center following an episode of unresponsiveness during treatment yesterday.  Admitted for further workup. Feels better today, no recurrent episodes. States he feels he has gained body weight, pants are fitting tighter, appetite increased while on prednisone taper first 2 weeks of October.   Objective Vitals:   03/17/17 0029 03/17/17 0422 03/17/17 0934 03/17/17 1340  BP: (!) 109/59 95/69 (!) 87/57 101/69  Pulse: 92 85 91 88  Resp: 20 20 20 18   Temp: 97.9 F (36.6 C) 98 F (36.7 C) 98.4 F (36.9 C) 98.6 F (37 C)  TempSrc: Oral Oral Oral Oral  SpO2: 100% 100% 98% 100%  Weight:  113.4 kg (250 lb 1.6 oz)    Height:       Physical Exam General:NAD, WDWN Heart:RRR Lungs:CTAB Extremities:no edema Dialysis Access: LU AVF +thrill  Filed Weights   03/16/17 2004 03/17/17 0422  Weight: 114.2 kg (251 lb 12.8 oz) 113.4 kg (250 lb 1.6 oz)    Intake/Output Summary (Last 24 hours) at 03/17/17 1355 Last data filed at 03/17/17 1300  Gross per 24 hour  Intake              600 ml  Output                0 ml  Net              600 ml    Additional Objective Labs: Basic Metabolic Panel:  Recent Labs Lab 03/16/17 0900 03/16/17 0924 03/16/17 1317 03/17/17 0318  NA 134* 135  --  134*  K 4.3 4.3  --  4.6  CL 90* 95*  --  92*  CO2 28  --   --  28  GLUCOSE 101* 103*  --  96  BUN 27* 28*  --  36*  CREATININE 8.39* 9.20*  --  11.55*  CALCIUM 8.9  --   --  8.9  PHOS  --   --  5.2*  --    Liver Function Tests:  Recent Labs Lab 03/16/17 0900 03/17/17 0318  AST 20 18  ALT <5* <5*  ALKPHOS 86 80  BILITOT 0.5 0.6  PROT 8.2* 7.2  ALBUMIN 3.3* 2.9*   CBC:  Recent Labs Lab 03/16/17 0900 03/16/17 0924 03/17/17 0318  WBC 6.2  --  4.4  NEUTROABS 4.0  --   --   HGB 13.8 17.0 12.4*  HCT 41.0 50.0 37.9*  MCV 96.7  --  98.2  PLT 122*  --  142*    Cardiac Enzymes:  Recent Labs Lab  03/16/17 1317 03/16/17 2109 03/17/17 0318  CKTOTAL 136  --   --   TROPONINI 0.33* 0.32* 0.31*   CBG:  Recent Labs Lab 03/16/17 0912 03/16/17 2102 03/17/17 0606 03/17/17 1116  GLUCAP 98 103* 69 105*    Lab Results  Component Value Date   INR 1.73 03/17/2017   INR 1.40 03/16/2017   INR 1.21 02/22/2017   Studies/Results: Mr Brain Wo Contrast  Result Date: 03/16/2017 CLINICAL DATA:  Initial evaluation for acute altered mental status, unresponsiveness, possible left facial droop. EXAM: MRI HEAD WITHOUT CONTRAST TECHNIQUE: Multiplanar, multiecho pulse sequences of the brain and surrounding structures were obtained without intravenous contrast. COMPARISON:  [facility] CT from earlier same day. FINDINGS: Brain: Generalized age related cerebral atrophy. Patchy T2/FLAIR hyperintensity involving the periventricular and deep white matter both cerebral ulcers, most cysts with chronic small vessel ischemic disease.  Small remote hemorrhagic right thalamic lacunar infarct. Additional remote lacunar infarcts present within the pons. Scattered chronic micro hemorrhages within the pons most consistent with chronic underlying hypertension. No abnormal foci of restricted diffusion to suggest acute or subacute ischemia. Gray-white matter differentiation maintained. No evidence for acute intracranial hemorrhage. No mass lesion, midline shift or mass effect. No hydrocephalus. No extra-axial fluid collection. Major dural sinuses are patent. Pituitary gland and suprasellar region normal. Midline structures intact and normal. Vascular: Major intracranial vascular flow voids are well maintained. Skull and upper cervical spine: Craniocervical junction normal. Degenerative spondylolysis noted at C3-4 with resultant mild stenosis. Remainder the visualized upper cervical spine otherwise unremarkable. Bone marrow signal intensity within normal limits. No scalp soft tissue abnormality. Sinuses/Orbits: Globes and orbital soft  tissues within normal limits. Other: Paranasal sinuses are clear. No mastoid effusion. Inner ear structures normal. IMPRESSION: 1. No acute intracranial abnormality identified. 2. Chronic microvascular ischemic disease with remote right thalamic and pontine lacunar infarcts. 3. Scattered chronic micro hemorrhages involving the pons, likely related to chronic small vessel disease and/or hypertension. Electronically Signed   By: Jeannine Boga M.D.   On: 03/16/2017 19:30   Dg Chest Port 1 View  Result Date: 03/16/2017 CLINICAL DATA:  Code stroke EXAM: PORTABLE CHEST 1 VIEW COMPARISON:  None. FINDINGS: Cardiomegaly. Mediastinal shadows unremarkable. The lungs are clear. No edema, infiltrate or effusion. IMPRESSION: Cardiomegaly.  No active disease. Electronically Signed   By: Nelson Chimes M.D.   On: 03/16/2017 10:19   Ct Head Code Stroke Wo Contrast  Result Date: 03/16/2017 CLINICAL DATA:  Code stroke. Code stroke. Decreased responsiveness. EXAM: CT HEAD WITHOUT CONTRAST TECHNIQUE: Contiguous axial images were obtained from the base of the skull through the vertex without intravenous contrast. COMPARISON:  CT head without contrast 10/11/2012 FINDINGS: Brain: No acute infarct, hemorrhage, or mass lesion is present. The ventricles are of normal size. Mild white matter changes are stable. Remote lacunar infarcts of the pons are again noted. The brainstem is unremarkable. Vascular: Atherosclerotic calcifications are present within the cavernous internal carotid arteries. There is no hyperdense vessel. Skull: Calvarium is intact. No focal lytic or blastic lesions are present. Sinuses/Orbits: The paranasal sinuses and mastoid air cells are clear. The globes and orbits are within normal limits bilaterally. ASPECTS [facility] Stroke Program Early CT Score) - Ganglionic level infarction (caudate, lentiform nuclei, internal capsule, insula, M1-M3 cortex): 7/7 - Supraganglionic infarction (M4-M6 cortex): 3/3 Total  score (0-10 with 10 being normal): 10/10 IMPRESSION: 1. No acute intracranial abnormality or significant interval change. 2. Stable mild white matter disease. 3. Remote lacunar infarct of the brainstem are stable 4. ASPECTS is 10/10 These results were communicated by text page at the time of interpretation on 03/16/2017 at 9:24 am to Dr. Leonel Ramsay. Electronically Signed   By: San Morelle M.D.   On: 03/16/2017 09:25    Medications:  . amiodarone  100 mg Oral Daily  . calcium acetate  2,001 mg Oral TID WC  . chlorhexidine  15 mL Mouth/Throat BID  . cholecalciferol  1,000 Units Oral Daily  . cinacalcet  90 mg Oral Q supper  . feeding supplement (NEPRO CARB STEADY)  237 mL Oral QODAY  . midodrine  10 mg Oral TID WC  . Warfarin - Pharmacist Dosing Inpatient   Does not apply q1800    Dialysis Orders: MWF - [facility] 4hrs 450/800 EDW 112.5kg 2K 2.25Ca Profile 4, Linear Na LU AVF  Hectorol 46mcg IV qHD Parsabiv 5mg  IV qHD Heparin  7000 Units IV  Assessment/Plan: 1. Unresponsiveness - Psychosomatic vs panic attack vs disequilibrium syndrome. Unremarkable EEG, Head CT, and MRI brain. Not getting to EDW in last 3 weeks, weight gain noted.  2. CHF - slight improvement in EF to 30-35%.  Per primary 3. ESRD - MWF - HD at outpatient center or IHD if still admitted.  3. Anemia of CKD- Hgb 12.4 Stable.  4. Secondary hyperparathyroidism - Ca and P at goal. Cont parsabiv, hectorol and binder.  5. Hypotension/volume - BP chronically soft, on midodrine prn with HD.  Had been meeting EDW until the last 3 weeks, likely pt gained weight while on steroid. History of intermittent large IDWG, typical gain between 3-5L. Will increase EDW 1kg. Monitor closely as outpatient.  6. Nutrition - Alb ok  7. H/o of Afib/flutter - on coumadin 8. Chronic pain - per primary  Jen Mow, PA-C [facility] Pager: [phone] 03/17/2017,1:55 PM  LOS: 0 days   Pt seen, examined and agree  w A/P as above.  Kelly Splinter MD [employer] pager [phone]   03/17/2017, 3:14 PM

## 2017-03-17 NOTE — Care Management Obs Status (Signed)
[place] NOTIFICATION   Patient Details  Name: [name] MRN: [id number] Date of Birth: 1960-06-22   Medicare Observation Status Notification Given:  Yes    Pollie Friar, RN 03/17/2017, 3:38 PM

## 2017-03-17 NOTE — Progress Notes (Signed)
This RN went over discharge with Pt. Pt. Verbalized  Understanding Of discharge. All question and concerns addressed. Pt. Discharged home and taken down in a wheelchair.

## 2017-03-17 NOTE — Progress Notes (Signed)
  Echocardiogram 2D Echocardiogram has been performed.  Rhyland Hinderliter G Anastyn Ayars 03/17/2017, 10:30 AM

## 2017-03-17 NOTE — Progress Notes (Signed)
Patient is back to baseline. At This time, he is awake, alert, conversant without focal deficits.  His MRI and EEG were normal.  At this time, I think that this is most likely a psychogenic episode, though dialysis disequilibrium syndrome would also be a consideration.  No further accommodations at this time, please call with further questions or concerns.  Roland Rack, MD [facility] [phone]  If 7pm- 7am, please page neurology on call as listed in [place].

## 2017-03-17 NOTE — Progress Notes (Signed)
ANTICOAGULATION CONSULT NOTE - Follow up  Pharmacy Consult for warfarin Indication: atrial fibrillation  Allergies  Allergen Reactions  . Ace Inhibitors Itching and Cough    COUGH > INTOLERANCE    Patient Measurements: Height: 6\' 1"  (185.4 cm) Weight: 250 lb 1.6 oz (113.4 kg) IBW/kg (Calculated) : 79.9 kg  Vital Signs: Temp: 98.6 F (37 C) (10/25 1340) Temp Source: Oral (10/25 1340) BP: 101/69 (10/25 1340) Pulse Rate: 88 (10/25 1340)  Labs:  Recent Labs  03/16/17 0900 03/16/17 0924 03/16/17 1317 03/16/17 2109 03/17/17 0318  HGB 13.8 17.0  --   --  12.4*  HCT 41.0 50.0  --   --  37.9*  PLT 122*  --   --   --  142*  APTT 44*  --   --   --  52*  LABPROT 17.1*  --   --   --  20.1*  INR 1.40  --   --   --  1.73  CREATININE 8.39* 9.20*  --   --  11.55*  CKTOTAL  --   --  136  --   --   TROPONINI  --   --  0.33* 0.32* 0.31*    Estimated Creatinine Clearance: 9.8 mL/min (A) (by C-G formula based on SCr of 11.55 mg/dL (H)).   Medical History: Past Medical History:  Diagnosis Date  . Atrial flutter ([place])   . 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 Cardiomyopathy   . CHF (congestive heart failure) ([place])   . Coronary artery disease    normal coronaries by 10/10/08 cath, Cardiac Cath 08-04-12 epic.Dr. Doylene Canard follows  . Diabetes mellitus    pt. states he's borderline diabetic., no longer taking med,- off med. since 2013  . Dialysis patient [facility])    Mon-Wed-Fri([place])- Left AV fistula  . Diverticulitis November 2016   reoccurred in December 2016  . ESRD (end stage renal disease) ([place])   . GERD (gastroesophageal reflux disease)   . Gout   . History of nephrectomy 07/04/2012   History of right nephrectomy in 2002 for renal cell carcinoma   . History of unilateral nephrectomy   . Hypertension   . Low iron   . Myocardial infarction [facility]) ?2006  . Renal cell carcinoma    dialysis- MWF- Dr. Mercy Moore follows.  . Renal insufficiency    . Shortness of breath 05/19/11   "at rest, lying down, w/exertion"  . Stroke [facility]) 02/2011   05/19/11 denies residual  . Umbilical hernia 89/38/10   unrepaired    Medications:  Prescriptions Prior to Admission  Medication Sig Dispense Refill Last Dose  . amiodarone (PACERONE) 200 MG tablet Take 0.5 tablets (100 mg total) by mouth daily. 15 tablet 6 03/15/2017 at Unknown time  . calcium acetate (PHOSLO) 667 MG capsule Take 2,001 mg by mouth 3 (three) times daily.    03/15/2017 at Unknown time  . chlorhexidine (PERIDEX) 0.12 % solution RINSE WITH 1/2 OZ TWICE A DAY (WITH ORAL HYGIENE ROUTINE)  12 03/15/2017 at Unknown time  . cholecalciferol (VITAMIN D) 1000 units tablet Take 1,000 Units by mouth daily.   03/15/2017 at Unknown time  . cinacalcet (SENSIPAR) 90 MG tablet Take 90 mg by mouth daily with supper.    03/15/2017 at Unknown time  . isosorbide dinitrate (ISORDIL) 20 MG tablet Take 20 mg by mouth daily.    03/15/2017 at Unknown time  . lidocaine-prilocaine (EMLA) cream Apply 1 application  topically every Monday, Wednesday, and Friday. Apply to port access prior to dialysis  12 03/16/2017 at Unknown time  . Nutritional Supplements (FEEDING SUPPLEMENT, NEPRO CARB STEADY,) LIQD Take 237 mLs by mouth 2 (two) times daily between meals. (Patient taking differently: Take 237 mLs by mouth every other day. ) 1 Can 0 03/15/2017 at Unknown time  . oxyCODONE-acetaminophen (PERCOCET/ROXICET) 5-325 MG tablet Take 1 tablet by mouth daily as needed for pain.  0 Past Week at Unknown time  . sevelamer carbonate (RENVELA) 800 MG tablet Take 2-3 tablets (1,600-2,400 mg total) by mouth See admin instructions. 1600 mg with snacks and 2400 mg with meals   03/15/2017 at Unknown time  . warfarin (COUMADIN) 10 MG tablet Take 15-20 mg by mouth See admin instructions. Take 15mg  all days except on dialysis days (M-W-F ) take 20mg .  1 03/15/2017 at Unknown time  . [DISCONTINUED] midodrine (PROAMATINE) 10 MG tablet  Take 1 tablet (10 mg total) by mouth 3 (three) times daily with meals. (Patient taking differently: Take 5-10 mg by mouth See admin instructions. Pt takes 5mg  MWF, 10mg  all other days) 90 tablet 1 03/16/2017 at Unknown time  . carvedilol (COREG) 3.125 MG tablet Take 1 tablet (3.125 mg total) by mouth 2 (two) times daily with a meal. (Patient not taking: Reported on 03/16/2017) 60 tablet 0 Not Taking at Unknown time  . multivitamin (RENA-VIT) TABS tablet Take 1 tablet by mouth at bedtime. (Patient not taking: Reported on 03/16/2017) 30 tablet 0 Not Taking at Unknown time  . nitroGLYCERIN (NITROSTAT) 0.4 MG SL tablet Place 0.4 mg under the tongue every 5 (five) minutes as needed for chest pain.   0 prn  . pantoprazole (PROTONIX) 40 MG tablet Take 1 tablet (40 mg total) by mouth 2 (two) times daily. (Patient not taking: Reported on 03/16/2017) 60 tablet 1 Not Taking at Unknown time    Assessment: 61 YOM with h/o of ESRD on MWF HD and afib on warfarin at home. Patient admitted here 10/24 after he became unresponsive during HD session with left facial droop. Stroke ruled out. Pharmacy consulted to resume home warfarin.CHADSVASc score 6.  INR on admission is subtherapeutic at 1.4.  Today INR is 1.73  H/H decreased to 12.4/37.9. Plt 142k. Meals eaten 90%-75% so far today.   Home Coumadin dose: 20 mg on MWF; 15 mg on all other days , last taken pta 10/23.  Drug interactions: amiodarone 100 mg daily   Goal of Therapy:  INR 2-3 Monitor platelets by anticoagulation protocol: Yes   Plan:  -Warfarin 15 mg x 1 dose today -Monitor daily PT/INR  Nicole Cella, RPh Clinical Pharmacist Pager: [phone] 8a-330p [phone] 330p-1030p phone [phone] or [phone] Main pharmacy [phone] 03/17/2017 4:05 PM

## 2017-03-17 NOTE — Care Management Note (Signed)
Case Management Note  Patient Details  Name: Lautaro Koral MRN: [id number] Date of Birth: [birth year]  Subjective/Objective:                    Action/Plan: Pt discharging home with self care. Pt has PCP, insurance and transportation home. No further needs per CM.  Expected Discharge Date:  03/17/17               Expected Discharge Plan:  Home/Self Care  In-House Referral:     Discharge planning Services     Post Acute Care Choice:    Choice offered to:     DME Arranged:    DME Agency:     HH Arranged:    HH Agency:     Status of Service:  Completed, signed off  If discussed at [employer] of Stay Meetings, dates discussed:    Additional Comments:  Pollie Friar, RN 03/17/2017, 3:54 PM

## 2017-03-17 NOTE — Discharge Summary (Signed)
Physician Discharge Summary   Patient ID: [name] MRN: [id number] DOB/AGE: [birth year] 56 y.o.  Admit date: 03/16/2017 Discharge date: 03/17/2017  Primary Care Physician:  Iona Beard, MD  Discharge Diagnoses:    . Unresponsiveness/syncope . Elevated troponin . Systolic CHF, chronic (HCC) ESRD on HD MWF   Hypotension   Consults: Neurology Nephrology  Recommendations for Outpatient Follow-up:  1. Antihypertensives discontinued, patient prescribed midodrine 5 mg on HD days. 2. Please repeat CBC/BMET at next visit   DIET: Heart healthy diet    Allergies:   Allergies  Allergen Reactions  . Ace Inhibitors Itching and Cough    COUGH > INTOLERANCE     DISCHARGE MEDICATIONS: Current Discharge Medication List    CONTINUE these medications which have CHANGED   Details  midodrine (PROAMATINE) 10 MG tablet Take 0.5 tablets (5 mg total) by mouth every Monday, Wednesday, and Friday with hemodialysis. Take on the morning of dialysis days Qty: 30 tablet, Refills: 1      CONTINUE these medications which have NOT CHANGED   Details  amiodarone (PACERONE) 200 MG tablet Take 0.5 tablets (100 mg total) by mouth daily. Qty: 15 tablet, Refills: 6    calcium acetate (PHOSLO) 667 MG capsule Take 2,001 mg by mouth 3 (three) times daily.     chlorhexidine (PERIDEX) 0.12 % solution RINSE WITH 1/2 OZ TWICE A DAY (WITH ORAL HYGIENE ROUTINE) Refills: 12    cholecalciferol (VITAMIN D) 1000 units tablet Take 1,000 Units by mouth daily.    cinacalcet (SENSIPAR) 90 MG tablet Take 90 mg by mouth daily with supper.     lidocaine-prilocaine (EMLA) cream Apply 1 application topically every Monday, Wednesday, and Friday. Apply to port access prior to dialysis Refills: 12    Nutritional Supplements (FEEDING SUPPLEMENT, NEPRO CARB STEADY,) LIQD Take 237 mLs by mouth 2 (two) times daily between meals. Qty: 1 Can, Refills: 0    oxyCODONE-acetaminophen (PERCOCET/ROXICET) 5-325 MG tablet  Take 1 tablet by mouth daily as needed for pain. Refills: 0    sevelamer carbonate (RENVELA) 800 MG tablet Take 2-3 tablets (1,600-2,400 mg total) by mouth See admin instructions. 1600 mg with snacks and 2400 mg with meals    warfarin (COUMADIN) 10 MG tablet Take 15-20 mg by mouth See admin instructions. Take 15mg  all days except on dialysis days (M-W-F ) take 20mg . Refills: 1    nitroGLYCERIN (NITROSTAT) 0.4 MG SL tablet Place 0.4 mg under the tongue every 5 (five) minutes as needed for chest pain.  Refills: 0      STOP taking these medications     isosorbide dinitrate (ISORDIL) 20 MG tablet      carvedilol (COREG) 3.125 MG tablet      multivitamin (RENA-VIT) TABS tablet      pantoprazole (PROTONIX) 40 MG tablet          Brief H and P: For complete details please refer to admission H and P, but in brief [name] is a 56 y.o. male with medical history significant of atrial flutter, right eye blindness, CHF, CAD, diabetes, ESRD on dialysis Monday Wednesday Friday, GERD, MI, renal cell carcinoma, CVA without residual deficits from 2012.Patient was in his normal state of health until approximately 3 hours into his dialysis session when he became unresponsive. No additional reports of possible left facial droop. The symptoms started at approximately 08:30. At time of arrival patient was reportedly nonresponsive but had no facial droop noted. Code stroke was called and neurology evaluated patient at time of  arrival.  Hospital Course:  Syncopal episode - Possibly due to hypotension or metabolic derangements versus psychosomatic. -Patient was admitted for further workup MRI was negative for acute CVA -Cortisol level 16.5 -2D echo showed EF of 16-07%, grade 1 diastolic dysfunction -EEG was normal, patient was cleared by neurology   Chronic systolic and diastolic CHF -Currently euvolemic, volume management with hemodialysis, cannot be on any diuresis/BB/ACE inhibitor due to  hypotension -2D echo showed EF of 30-35%, better from his EF of 20-25% in 06/2015  Hypotension -Beta-blocker, Imdur were discontinued.  Patient was placed on midodrine 5 mg on nondialysis days -Nephrology was consulted and will discuss with outpatient dialysis center regarding his dry weight to be increased  ESRD on HD Patient had hemodialysis on Wednesday on his scheduled day, needs HD tomorrow at his outpatient dialysis center  Day of Discharge BP 101/69 (BP Location: Right Arm)   Pulse 88   Temp 98.6 F (37 C) (Oral)   Resp 18   Ht 6\' 1"  (1.854 m)   Wt 113.4 kg (250 lb 1.6 oz)   SpO2 100%   BMI 33.00 kg/m   Physical Exam: General: Alert and awake oriented x3 not in any acute distress. HEENT: anicteric sclera, pupils reactive to light and accommodation CVS: S1-S2 clear no murmur rubs or gallops Chest: clear to auscultation bilaterally, no wheezing rales or rhonchi Abdomen: soft nontender, nondistended, normal bowel sounds Extremities: no cyanosis, clubbing or edema noted bilaterally Neuro: Cranial nerves II-XII intact, no focal neurological deficits   The results of significant diagnostics from this hospitalization (including imaging, microbiology, ancillary and laboratory) are listed below for reference.    LAB RESULTS: Basic Metabolic Panel:  Recent Labs Lab 03/16/17 0900 03/16/17 0924 03/16/17 1317 03/17/17 0318  NA 134* 135  --  134*  K 4.3 4.3  --  4.6  CL 90* 95*  --  92*  CO2 28  --   --  28  GLUCOSE 101* 103*  --  96  BUN 27* 28*  --  36*  CREATININE 8.39* 9.20*  --  11.55*  CALCIUM 8.9  --   --  8.9  MG  --   --  2.3  --   PHOS  --   --  5.2*  --    Liver Function Tests:  Recent Labs Lab 03/16/17 0900 03/17/17 0318  AST 20 18  ALT <5* <5*  ALKPHOS 86 80  BILITOT 0.5 0.6  PROT 8.2* 7.2  ALBUMIN 3.3* 2.9*   No results for input(s): LIPASE, AMYLASE in the last 168 hours. No results for input(s): AMMONIA in the last 168 hours. CBC:  Recent  Labs Lab 03/16/17 0900 03/16/17 0924 03/17/17 0318  WBC 6.2  --  4.4  NEUTROABS 4.0  --   --   HGB 13.8 17.0 12.4*  HCT 41.0 50.0 37.9*  MCV 96.7  --  98.2  PLT 122*  --  142*   Cardiac Enzymes:  Recent Labs Lab 03/16/17 1317 03/16/17 2109 03/17/17 0318  CKTOTAL 136  --   --   TROPONINI 0.33* 0.32* 0.31*   BNP: Invalid input(s): POCBNP CBG:  Recent Labs Lab 03/17/17 0606 03/17/17 1116  GLUCAP 69 105*    Significant Diagnostic Studies:  Mr Brain Wo Contrast  Result Date: 03/16/2017 CLINICAL DATA:  Initial evaluation for acute altered mental status, unresponsiveness, possible left facial droop. EXAM: MRI HEAD WITHOUT CONTRAST TECHNIQUE: Multiplanar, multiecho pulse sequences of the brain and surrounding structures were obtained without  intravenous contrast. COMPARISON:  [facility] CT from earlier same day. FINDINGS: Brain: Generalized age related cerebral atrophy. Patchy T2/FLAIR hyperintensity involving the periventricular and deep white matter both cerebral ulcers, most cysts with chronic small vessel ischemic disease. Small remote hemorrhagic right thalamic lacunar infarct. Additional remote lacunar infarcts present within the pons. Scattered chronic micro hemorrhages within the pons most consistent with chronic underlying hypertension. No abnormal foci of restricted diffusion to suggest acute or subacute ischemia. Gray-white matter differentiation maintained. No evidence for acute intracranial hemorrhage. No mass lesion, midline shift or mass effect. No hydrocephalus. No extra-axial fluid collection. Major dural sinuses are patent. Pituitary gland and suprasellar region normal. Midline structures intact and normal. Vascular: Major intracranial vascular flow voids are well maintained. Skull and upper cervical spine: Craniocervical junction normal. Degenerative spondylolysis noted at C3-4 with resultant mild stenosis. Remainder the visualized upper cervical spine otherwise  unremarkable. Bone marrow signal intensity within normal limits. No scalp soft tissue abnormality. Sinuses/Orbits: Globes and orbital soft tissues within normal limits. Other: Paranasal sinuses are clear. No mastoid effusion. Inner ear structures normal. IMPRESSION: 1. No acute intracranial abnormality identified. 2. Chronic microvascular ischemic disease with remote right thalamic and pontine lacunar infarcts. 3. Scattered chronic micro hemorrhages involving the pons, likely related to chronic small vessel disease and/or hypertension. Electronically Signed   By: Jeannine Boga M.D.   On: 03/16/2017 19:30   Dg Chest Port 1 View  Result Date: 03/16/2017 CLINICAL DATA:  Code stroke EXAM: PORTABLE CHEST 1 VIEW COMPARISON:  None. FINDINGS: Cardiomegaly. Mediastinal shadows unremarkable. The lungs are clear. No edema, infiltrate or effusion. IMPRESSION: Cardiomegaly.  No active disease. Electronically Signed   By: Nelson Chimes M.D.   On: 03/16/2017 10:19   Ct Head Code Stroke Wo Contrast  Result Date: 03/16/2017 CLINICAL DATA:  Code stroke. Code stroke. Decreased responsiveness. EXAM: CT HEAD WITHOUT CONTRAST TECHNIQUE: Contiguous axial images were obtained from the base of the skull through the vertex without intravenous contrast. COMPARISON:  CT head without contrast 10/11/2012 FINDINGS: Brain: No acute infarct, hemorrhage, or mass lesion is present. The ventricles are of normal size. Mild white matter changes are stable. Remote lacunar infarcts of the pons are again noted. The brainstem is unremarkable. Vascular: Atherosclerotic calcifications are present within the cavernous internal carotid arteries. There is no hyperdense vessel. Skull: Calvarium is intact. No focal lytic or blastic lesions are present. Sinuses/Orbits: The paranasal sinuses and mastoid air cells are clear. The globes and orbits are within normal limits bilaterally. ASPECTS [facility] Stroke Program Early CT Score) - Ganglionic level  infarction (caudate, lentiform nuclei, internal capsule, insula, M1-M3 cortex): 7/7 - Supraganglionic infarction (M4-M6 cortex): 3/3 Total score (0-10 with 10 being normal): 10/10 IMPRESSION: 1. No acute intracranial abnormality or significant interval change. 2. Stable mild white matter disease. 3. Remote lacunar infarct of the brainstem are stable 4. ASPECTS is 10/10 These results were communicated by text page at the time of interpretation on 03/16/2017 at 9:24 am to Dr. Leonel Ramsay. Electronically Signed   By: San Morelle M.D.   On: 03/16/2017 09:25    2D ECHO: Study Conclusions  - Left ventricle: The cavity size was mildly dilated. Wall   thickness was increased in a pattern of mild LVH. Systolic   function was moderately to severely reduced. The estimated   ejection fraction was in the range of 30% to 35%. Diffuse   hypokinesis. Doppler parameters are consistent with abnormal left   ventricular relaxation (grade 1 diastolic dysfunction). - Aortic  valve: There was trivial regurgitation. - Aortic root: The aortic root was mildly dilated. - Mitral valve: There was mild regurgitation.  Impressions:  - Moderate to severe global reduction in LV systolic function; mild   LVH: mild diastolic dysfunction; mild MR.  Disposition and Follow-up: Discharge Instructions    Diet - low sodium heart healthy    Complete by:  As directed    Increase activity slowly    Complete by:  As directed        DISPOSITION: [place], MD. Schedule an appointment as soon as possible for a visit in 2 week(s).   Specialty:  Family Medicine Contact information: [address] [place] [address] [phone]        Donato Heinz, MD. Schedule an appointment as soon as possible for a visit in 2 week(s).   Specialty:  Nephrology Contact information: [place] [address] [phone]             Time spent on Discharge: 25 minutes  Signed:   Estill Cotta M.D. [facility] 03/17/2017, 3:00 PM Pager: [phone]

## 2017-03-18 DIAGNOSIS — D631 Anemia in chronic kidney disease: Secondary | ICD-10-CM | POA: Diagnosis not present

## 2017-03-18 DIAGNOSIS — E1129 Type 2 diabetes mellitus with other diabetic kidney complication: Secondary | ICD-10-CM | POA: Diagnosis not present

## 2017-03-18 DIAGNOSIS — N2581 Secondary hyperparathyroidism of renal origin: Secondary | ICD-10-CM | POA: Diagnosis not present

## 2017-03-18 DIAGNOSIS — T8249XD Other complication of vascular dialysis catheter, subsequent encounter: Secondary | ICD-10-CM | POA: Diagnosis not present

## 2017-03-18 DIAGNOSIS — N186 End stage renal disease: Secondary | ICD-10-CM | POA: Diagnosis not present

## 2017-03-18 DIAGNOSIS — L0390 Cellulitis, unspecified: Secondary | ICD-10-CM | POA: Diagnosis not present

## 2017-03-21 DIAGNOSIS — D631 Anemia in chronic kidney disease: Secondary | ICD-10-CM | POA: Diagnosis not present

## 2017-03-21 DIAGNOSIS — N2581 Secondary hyperparathyroidism of renal origin: Secondary | ICD-10-CM | POA: Diagnosis not present

## 2017-03-21 DIAGNOSIS — L0390 Cellulitis, unspecified: Secondary | ICD-10-CM | POA: Diagnosis not present

## 2017-03-21 DIAGNOSIS — E1129 Type 2 diabetes mellitus with other diabetic kidney complication: Secondary | ICD-10-CM | POA: Diagnosis not present

## 2017-03-21 DIAGNOSIS — T8249XD Other complication of vascular dialysis catheter, subsequent encounter: Secondary | ICD-10-CM | POA: Diagnosis not present

## 2017-03-21 DIAGNOSIS — N186 End stage renal disease: Secondary | ICD-10-CM | POA: Diagnosis not present

## 2017-03-23 DIAGNOSIS — N2581 Secondary hyperparathyroidism of renal origin: Secondary | ICD-10-CM | POA: Diagnosis not present

## 2017-03-23 DIAGNOSIS — T8249XD Other complication of vascular dialysis catheter, subsequent encounter: Secondary | ICD-10-CM | POA: Diagnosis not present

## 2017-03-23 DIAGNOSIS — Z992 Dependence on renal dialysis: Secondary | ICD-10-CM | POA: Diagnosis not present

## 2017-03-23 DIAGNOSIS — D631 Anemia in chronic kidney disease: Secondary | ICD-10-CM | POA: Diagnosis not present

## 2017-03-23 DIAGNOSIS — E1129 Type 2 diabetes mellitus with other diabetic kidney complication: Secondary | ICD-10-CM | POA: Diagnosis not present

## 2017-03-23 DIAGNOSIS — I120 Hypertensive chronic kidney disease with stage 5 chronic kidney disease or end stage renal disease: Secondary | ICD-10-CM | POA: Diagnosis not present

## 2017-03-23 DIAGNOSIS — N186 End stage renal disease: Secondary | ICD-10-CM | POA: Diagnosis not present

## 2017-03-23 DIAGNOSIS — L0390 Cellulitis, unspecified: Secondary | ICD-10-CM | POA: Diagnosis not present

## 2017-03-25 DIAGNOSIS — N186 End stage renal disease: Secondary | ICD-10-CM | POA: Diagnosis not present

## 2017-03-25 DIAGNOSIS — D631 Anemia in chronic kidney disease: Secondary | ICD-10-CM | POA: Diagnosis not present

## 2017-03-25 DIAGNOSIS — E1129 Type 2 diabetes mellitus with other diabetic kidney complication: Secondary | ICD-10-CM | POA: Diagnosis not present

## 2017-03-25 DIAGNOSIS — N2581 Secondary hyperparathyroidism of renal origin: Secondary | ICD-10-CM | POA: Diagnosis not present

## 2017-03-25 DIAGNOSIS — D509 Iron deficiency anemia, unspecified: Secondary | ICD-10-CM | POA: Diagnosis not present

## 2017-03-28 DIAGNOSIS — N186 End stage renal disease: Secondary | ICD-10-CM | POA: Diagnosis not present

## 2017-03-28 DIAGNOSIS — E1129 Type 2 diabetes mellitus with other diabetic kidney complication: Secondary | ICD-10-CM | POA: Diagnosis not present

## 2017-03-28 DIAGNOSIS — N2581 Secondary hyperparathyroidism of renal origin: Secondary | ICD-10-CM | POA: Diagnosis not present

## 2017-03-28 DIAGNOSIS — D631 Anemia in chronic kidney disease: Secondary | ICD-10-CM | POA: Diagnosis not present

## 2017-03-28 DIAGNOSIS — D509 Iron deficiency anemia, unspecified: Secondary | ICD-10-CM | POA: Diagnosis not present

## 2017-03-30 DIAGNOSIS — N186 End stage renal disease: Secondary | ICD-10-CM | POA: Diagnosis not present

## 2017-03-30 DIAGNOSIS — D631 Anemia in chronic kidney disease: Secondary | ICD-10-CM | POA: Diagnosis not present

## 2017-03-30 DIAGNOSIS — N2581 Secondary hyperparathyroidism of renal origin: Secondary | ICD-10-CM | POA: Diagnosis not present

## 2017-03-30 DIAGNOSIS — D509 Iron deficiency anemia, unspecified: Secondary | ICD-10-CM | POA: Diagnosis not present

## 2017-03-30 DIAGNOSIS — E1129 Type 2 diabetes mellitus with other diabetic kidney complication: Secondary | ICD-10-CM | POA: Diagnosis not present

## 2017-03-31 ENCOUNTER — Other Ambulatory Visit: Payer: Self-pay

## 2017-03-31 ENCOUNTER — Ambulatory Visit (HOSPITAL_COMMUNITY)
Admission: RE | Admit: 2017-03-31 | Discharge: 2017-03-31 | Disposition: A | Discharge status: Home / Self Care | Payer: Medicare Other | Source: Ambulatory Visit | Attending: Internal Medicine | Admitting: Internal Medicine

## 2017-03-31 ENCOUNTER — Encounter (HOSPITAL_COMMUNITY): Payer: Self-pay | Admitting: Internal Medicine

## 2017-03-31 VITALS — BP 110/80 | HR 97 | Wt 252.0 lb

## 2017-03-31 DIAGNOSIS — I2510 Atherosclerotic heart disease of native coronary artery without angina pectoris: Secondary | ICD-10-CM | POA: Diagnosis not present

## 2017-03-31 DIAGNOSIS — Z992 Dependence on renal dialysis: Secondary | ICD-10-CM | POA: Diagnosis not present

## 2017-03-31 DIAGNOSIS — M109 Gout, unspecified: Secondary | ICD-10-CM | POA: Diagnosis not present

## 2017-03-31 DIAGNOSIS — N186 End stage renal disease: Secondary | ICD-10-CM | POA: Insufficient documentation

## 2017-03-31 DIAGNOSIS — H5461 Unqualified visual loss, right eye, normal vision left eye: Secondary | ICD-10-CM | POA: Insufficient documentation

## 2017-03-31 DIAGNOSIS — I480 Paroxysmal atrial fibrillation: Secondary | ICD-10-CM

## 2017-03-31 DIAGNOSIS — E1122 Type 2 diabetes mellitus with diabetic chronic kidney disease: Secondary | ICD-10-CM | POA: Insufficient documentation

## 2017-03-31 DIAGNOSIS — I132 Hypertensive heart and chronic kidney disease with heart failure and with stage 5 chronic kidney disease, or end stage renal disease: Secondary | ICD-10-CM | POA: Diagnosis not present

## 2017-03-31 DIAGNOSIS — K219 Gastro-esophageal reflux disease without esophagitis: Secondary | ICD-10-CM | POA: Diagnosis not present

## 2017-03-31 DIAGNOSIS — I4892 Unspecified atrial flutter: Secondary | ICD-10-CM | POA: Diagnosis not present

## 2017-03-31 DIAGNOSIS — Z7901 Long term (current) use of anticoagulants: Secondary | ICD-10-CM | POA: Diagnosis not present

## 2017-03-31 DIAGNOSIS — Z79899 Other long term (current) drug therapy: Secondary | ICD-10-CM | POA: Diagnosis not present

## 2017-03-31 DIAGNOSIS — Z905 Acquired absence of kidney: Secondary | ICD-10-CM | POA: Diagnosis not present

## 2017-03-31 DIAGNOSIS — I429 Cardiomyopathy, unspecified: Secondary | ICD-10-CM | POA: Insufficient documentation

## 2017-03-31 DIAGNOSIS — Z85528 Personal history of other malignant neoplasm of kidney: Secondary | ICD-10-CM | POA: Diagnosis not present

## 2017-03-31 DIAGNOSIS — Z8673 Personal history of transient ischemic attack (TIA), and cerebral infarction without residual deficits: Secondary | ICD-10-CM | POA: Insufficient documentation

## 2017-03-31 DIAGNOSIS — I252 Old myocardial infarction: Secondary | ICD-10-CM | POA: Diagnosis not present

## 2017-03-31 DIAGNOSIS — I5022 Chronic systolic (congestive) heart failure: Secondary | ICD-10-CM

## 2017-03-31 NOTE — Progress Notes (Signed)
ADVANCED HF CLINIC CONSULT NOTE  Referring Physician: Colodonato  Primary Cardiologist: Doylene Canard   HPI:   [name] a 56 y.o.malewith medical history significant of paroxysmal atrial flutter, right eye blindness, systolic HF EF ~38% followed by Dr. Doylene Canard, CVA , diabetes, ESRD on dialysis Monday Wednesday Friday, GERD, renal cell carcinoma s/p R nephrectomy who is referred by Dr. Marval Regal for HF evaluation    Has been seeing Dr. Doylene Canard for systolic HF due to [place] since 2002. EF has been 25-35%. Has had multiple caths (last 10/17) with normal coronaries. Getting HD Since 2013.   LHC 10/17 minimal non-obstructive CAD  Recently admitted for AMS in HD. Stroke w/u negative. BP was low and carvedilol stopped  Has HD on M, W, F. Often comes off early due to cramps. Most of his HF meds stopped due to hypotension. Now on midodrine. During HD BP SBP 70-80s. When not on HD SBP 90s.   Lives with wife and daughter. Gets around ok. Can cut the grass with a riding mower. Does all ADLs without problem. Edema managed with HD. Says he is compliant with meds. No CP.   Often fatigued. Snores but not heavily. Says his wife has OSA but he doesn't think he has I.t  Review of Systems: [y] = yes, [ ]  = no   General: Weight gain [ ] ; Weight loss [ ] ; Anorexia [ ] ; Fatigue [ y]; Fever [ ] ; Chills [ ] ; Weakness [ ]   Cardiac: Chest pain/pressure [ ] ; Resting SOB [ ] ; Exertional SOB [ y]; Orthopnea [ ] ; Pedal Edema [ ] ; Palpitations [ ] ; Syncope [ ] ; Presyncope [ ] ; Paroxysmal nocturnal dyspnea[ ]   Pulmonary: Cough [ ] ; Wheezing[ ] ; Hemoptysis[ ] ; Sputum [ ] ; Snoring [ ]   GI: Vomiting[ ] ; Dysphagia[ ] ; Melena[ ] ; Hematochezia [ ] ; Heartburn[ ] ; Abdominal pain [ ] ; Constipation [ ] ; Diarrhea [ ] ; BRBPR [ ]   GU: Hematuria[ ] ; Dysuria [ ] ; Nocturia[ ]   Vascular: Pain in legs with walking [ ] ; Pain in feet with lying flat [ ] ; Non-healing sores [ ] ; Stroke [ ] ; TIA [ ] ; Slurred speech [ ] ;  Neuro:  Headaches[ ] ; Vertigo[ ] ; Seizures[ ] ; Paresthesias[ ] ;Blurred vision [ ] ; Diplopia [ ] ; Vision changes [ ]   Ortho/Skin: Arthritis Blue.Reese ]; Joint pain Blue.Reese ]; Muscle pain [ ] ; Joint swelling [ ] ; Back Pain [ ] ; Rash [ ]   Psych: Depression[ ] ; Anxiety[ ]   Heme: Bleeding problems [ ] ; Clotting disorders [ ] ; Anemia [ y]  Endocrine: Diabetes [ y]; Thyroid dysfunction[ ]    Past Medical History:  Diagnosis Date  . Atrial flutter ([place])   . 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 Cardiomyopathy   . CHF (congestive heart failure) ([place])   . Coronary artery disease    normal coronaries by 10/10/08 cath, Cardiac Cath 08-04-12 epic.Dr. Doylene Canard follows  . Diabetes mellitus    pt. states he's borderline diabetic., no longer taking med,- off med. since 2013  . Dialysis patient [facility])    Mon-Wed-Fri([place])- Left AV fistula  . Diverticulitis November 2016   reoccurred in December 2016  . ESRD (end stage renal disease) ([place])   . GERD (gastroesophageal reflux disease)   . Gout   . History of nephrectomy 07/04/2012   History of right nephrectomy in 2002 for renal cell carcinoma   . History of unilateral nephrectomy   . Hypertension   . Low iron   .  Myocardial infarction [facility]) ?2006  . Renal cell carcinoma    dialysis- MWF- Dr. Mercy Moore follows.  . Renal insufficiency   . Shortness of breath 05/19/11   "at rest, lying down, w/exertion"  . Stroke [facility]) 02/2011   05/19/11 denies residual  . Umbilical hernia 59/56/38   unrepaired    Current Outpatient Medications  Medication Sig Dispense Refill  . amiodarone (PACERONE) 200 MG tablet Take 0.5 tablets (100 mg total) by mouth daily. 15 tablet 6  . calcium acetate (PHOSLO) 667 MG capsule Take 2,001 mg by mouth 3 (three) times daily.     . chlorhexidine (PERIDEX) 0.12 % solution RINSE WITH 1/2 OZ TWICE A DAY (WITH ORAL HYGIENE ROUTINE)  12  . cholecalciferol (VITAMIN D) 1000 units tablet Take 1,000 Units by mouth daily.     . cinacalcet (SENSIPAR) 90 MG tablet Take 90 mg by mouth daily with supper.     . lidocaine-prilocaine (EMLA) cream Apply 1 application topically every Monday, Wednesday, and Friday. Apply to port access prior to dialysis  12  . midodrine (PROAMATINE) 10 MG tablet Take 0.5 tablets (5 mg total) by mouth every Monday, Wednesday, and Friday with hemodialysis. Take on the morning of dialysis days 30 tablet 1  . nitroGLYCERIN (NITROSTAT) 0.4 MG SL tablet Place 0.4 mg under the tongue every 5 (five) minutes as needed for chest pain.   0  . oxyCODONE-acetaminophen (PERCOCET/ROXICET) 5-325 MG tablet Take 1 tablet by mouth daily as needed for pain.  0  . sevelamer carbonate (RENVELA) 800 MG tablet Take 2-3 tablets (1,600-2,400 mg total) by mouth See admin instructions. 1600 mg with snacks and 2400 mg with meals    . warfarin (COUMADIN) 10 MG tablet Take 15-20 mg by mouth See admin instructions. Take 15mg  all days except on dialysis days (M-W-F ) take 20mg .  1   No current facility-administered medications for this encounter.     Allergies  Allergen Reactions  . Ace Inhibitors Itching and Cough    COUGH > INTOLERANCE      Social History   Socioeconomic History  . Marital status: Married    Spouse name: Not on file  . Number of children: Not on file  . Years of education: Not on file  . Highest education level: Not on file  Social Needs  . Financial resource strain: Not on file  . Food insecurity - worry: Not on file  . Food insecurity - inability: Not on file  . Transportation needs - medical: Not on file  . Transportation needs - non-medical: Not on file  Occupational History  . Not on file  Tobacco Use  . Smoking status: Never Smoker  . Smokeless tobacco: Never Used  Substance and Sexual Activity  . Alcohol use: No    Alcohol/week: 0.0 oz  . Drug use: No    Comment: Past hx-none in 20 yrs  . Sexual activity: Yes  Other Topics Concern  . Not on file  Social History Narrative    . Not on file      Family History  Problem Relation Age of Onset  . Hypertension Mother   . Kidney disease Mother     Vitals:   03/31/17 0939  BP: 110/80  Pulse: 97  SpO2: 100%  Weight: 252 lb (114.3 kg)    PHYSICAL EXAM: General:  Well appearing. No respiratory difficulty HEENT: normal Neck: supple. no JVD. Carotids 2+ bilat; no bruits. No lymphadenopathy or thryomegaly appreciated. Cor: PMI laterally displaced. Regular  rate & rhythm. No rubs, gallops or murmurs. Lungs: clear Abdomen: obese soft, nontender, nondistended. No hepatosplenomegaly. No bruits or masses. Good bowel sounds. Extremities: no cyanosis, clubbing, rash, edema  LUE AVF Neuro: alert & oriented x 3, cranial nerves grossly intact. moves all 4 extremities w/o difficulty. Affect pleasant.  ECG. NSR 97 IVCD (qrs 155ms)   ASSESSMENT & PLAN:  1. Chronic systolic HF  - due to longstanding NICM EF 30-35% by echo 10/18 - Volume status controlled by HD - NYHA II-III - Of HF meds due to hypotension requiring midodrine - QRS not wide enough for CRT - With low EF and could consider SQ ICD  2. ESRD on HD - per Dr. Marval Regal  3. DM2 - followed by PCP  4. Previous CVA - no significant residual  5.  PAFL  - in NSR. Continue amio and coumadin  Given ESRD and low BP prohibiting titration of HF meds, unfortunately we have little to offer him in the HF Clinic aside from the therapies he has already gotten. He is not candidate for heart-kidney transplant with co-morbidities. We discussed possible sleep study but he feels he sleeps well. Can consider SQ ICD for  Prevention of SCD.   I am happy to see him back on PRN basis.   Glori Bickers, MD  10:34 AM

## 2017-03-31 NOTE — Patient Instructions (Signed)
Your physician has recommended that you have a sleep study. This test records several body functions during sleep, including: brain activity, eye movement, oxygen and carbon dioxide blood levels, heart rate and rhythm, breathing rate and rhythm, the flow of air through your mouth and nose, snoring, body muscle movements, and chest and belly movement.  [phone] opt. 5   Your physician recommends that you schedule a follow-up appointment in: As needed

## 2017-04-01 DIAGNOSIS — N2581 Secondary hyperparathyroidism of renal origin: Secondary | ICD-10-CM | POA: Diagnosis not present

## 2017-04-01 DIAGNOSIS — D631 Anemia in chronic kidney disease: Secondary | ICD-10-CM | POA: Diagnosis not present

## 2017-04-01 DIAGNOSIS — N186 End stage renal disease: Secondary | ICD-10-CM | POA: Diagnosis not present

## 2017-04-01 DIAGNOSIS — E1129 Type 2 diabetes mellitus with other diabetic kidney complication: Secondary | ICD-10-CM | POA: Diagnosis not present

## 2017-04-01 DIAGNOSIS — D509 Iron deficiency anemia, unspecified: Secondary | ICD-10-CM | POA: Diagnosis not present

## 2017-04-04 DIAGNOSIS — D509 Iron deficiency anemia, unspecified: Secondary | ICD-10-CM | POA: Diagnosis not present

## 2017-04-04 DIAGNOSIS — N186 End stage renal disease: Secondary | ICD-10-CM | POA: Diagnosis not present

## 2017-04-04 DIAGNOSIS — N2581 Secondary hyperparathyroidism of renal origin: Secondary | ICD-10-CM | POA: Diagnosis not present

## 2017-04-04 DIAGNOSIS — E1129 Type 2 diabetes mellitus with other diabetic kidney complication: Secondary | ICD-10-CM | POA: Diagnosis not present

## 2017-04-04 DIAGNOSIS — D631 Anemia in chronic kidney disease: Secondary | ICD-10-CM | POA: Diagnosis not present

## 2017-04-06 DIAGNOSIS — N2581 Secondary hyperparathyroidism of renal origin: Secondary | ICD-10-CM | POA: Diagnosis not present

## 2017-04-06 DIAGNOSIS — E1129 Type 2 diabetes mellitus with other diabetic kidney complication: Secondary | ICD-10-CM | POA: Diagnosis not present

## 2017-04-06 DIAGNOSIS — D509 Iron deficiency anemia, unspecified: Secondary | ICD-10-CM | POA: Diagnosis not present

## 2017-04-06 DIAGNOSIS — D631 Anemia in chronic kidney disease: Secondary | ICD-10-CM | POA: Diagnosis not present

## 2017-04-06 DIAGNOSIS — N186 End stage renal disease: Secondary | ICD-10-CM | POA: Diagnosis not present

## 2017-04-08 DIAGNOSIS — N2581 Secondary hyperparathyroidism of renal origin: Secondary | ICD-10-CM | POA: Diagnosis not present

## 2017-04-08 DIAGNOSIS — D509 Iron deficiency anemia, unspecified: Secondary | ICD-10-CM | POA: Diagnosis not present

## 2017-04-08 DIAGNOSIS — D631 Anemia in chronic kidney disease: Secondary | ICD-10-CM | POA: Diagnosis not present

## 2017-04-08 DIAGNOSIS — E1129 Type 2 diabetes mellitus with other diabetic kidney complication: Secondary | ICD-10-CM | POA: Diagnosis not present

## 2017-04-08 DIAGNOSIS — N186 End stage renal disease: Secondary | ICD-10-CM | POA: Diagnosis not present

## 2017-04-10 DIAGNOSIS — D631 Anemia in chronic kidney disease: Secondary | ICD-10-CM | POA: Diagnosis not present

## 2017-04-10 DIAGNOSIS — D509 Iron deficiency anemia, unspecified: Secondary | ICD-10-CM | POA: Diagnosis not present

## 2017-04-10 DIAGNOSIS — N186 End stage renal disease: Secondary | ICD-10-CM | POA: Diagnosis not present

## 2017-04-10 DIAGNOSIS — E1129 Type 2 diabetes mellitus with other diabetic kidney complication: Secondary | ICD-10-CM | POA: Diagnosis not present

## 2017-04-10 DIAGNOSIS — N2581 Secondary hyperparathyroidism of renal origin: Secondary | ICD-10-CM | POA: Diagnosis not present

## 2017-04-12 DIAGNOSIS — N2581 Secondary hyperparathyroidism of renal origin: Secondary | ICD-10-CM | POA: Diagnosis not present

## 2017-04-12 DIAGNOSIS — E1129 Type 2 diabetes mellitus with other diabetic kidney complication: Secondary | ICD-10-CM | POA: Diagnosis not present

## 2017-04-12 DIAGNOSIS — N186 End stage renal disease: Secondary | ICD-10-CM | POA: Diagnosis not present

## 2017-04-12 DIAGNOSIS — D509 Iron deficiency anemia, unspecified: Secondary | ICD-10-CM | POA: Diagnosis not present

## 2017-04-12 DIAGNOSIS — D631 Anemia in chronic kidney disease: Secondary | ICD-10-CM | POA: Diagnosis not present

## 2017-04-15 DIAGNOSIS — D509 Iron deficiency anemia, unspecified: Secondary | ICD-10-CM | POA: Diagnosis not present

## 2017-04-15 DIAGNOSIS — N2581 Secondary hyperparathyroidism of renal origin: Secondary | ICD-10-CM | POA: Diagnosis not present

## 2017-04-15 DIAGNOSIS — E1129 Type 2 diabetes mellitus with other diabetic kidney complication: Secondary | ICD-10-CM | POA: Diagnosis not present

## 2017-04-15 DIAGNOSIS — D631 Anemia in chronic kidney disease: Secondary | ICD-10-CM | POA: Diagnosis not present

## 2017-04-15 DIAGNOSIS — N186 End stage renal disease: Secondary | ICD-10-CM | POA: Diagnosis not present

## 2017-04-18 DIAGNOSIS — N186 End stage renal disease: Secondary | ICD-10-CM | POA: Diagnosis not present

## 2017-04-18 DIAGNOSIS — D631 Anemia in chronic kidney disease: Secondary | ICD-10-CM | POA: Diagnosis not present

## 2017-04-18 DIAGNOSIS — N2581 Secondary hyperparathyroidism of renal origin: Secondary | ICD-10-CM | POA: Diagnosis not present

## 2017-04-18 DIAGNOSIS — D509 Iron deficiency anemia, unspecified: Secondary | ICD-10-CM | POA: Diagnosis not present

## 2017-04-18 DIAGNOSIS — E1129 Type 2 diabetes mellitus with other diabetic kidney complication: Secondary | ICD-10-CM | POA: Diagnosis not present

## 2017-04-20 DIAGNOSIS — N186 End stage renal disease: Secondary | ICD-10-CM | POA: Diagnosis not present

## 2017-04-20 DIAGNOSIS — I4892 Unspecified atrial flutter: Secondary | ICD-10-CM | POA: Diagnosis not present

## 2017-04-20 DIAGNOSIS — E1129 Type 2 diabetes mellitus with other diabetic kidney complication: Secondary | ICD-10-CM | POA: Diagnosis not present

## 2017-04-20 DIAGNOSIS — D509 Iron deficiency anemia, unspecified: Secondary | ICD-10-CM | POA: Diagnosis not present

## 2017-04-20 DIAGNOSIS — N2581 Secondary hyperparathyroidism of renal origin: Secondary | ICD-10-CM | POA: Diagnosis not present

## 2017-04-20 DIAGNOSIS — D631 Anemia in chronic kidney disease: Secondary | ICD-10-CM | POA: Diagnosis not present

## 2017-04-22 DIAGNOSIS — N186 End stage renal disease: Secondary | ICD-10-CM | POA: Diagnosis not present

## 2017-04-22 DIAGNOSIS — D509 Iron deficiency anemia, unspecified: Secondary | ICD-10-CM | POA: Diagnosis not present

## 2017-04-22 DIAGNOSIS — I120 Hypertensive chronic kidney disease with stage 5 chronic kidney disease or end stage renal disease: Secondary | ICD-10-CM | POA: Diagnosis not present

## 2017-04-22 DIAGNOSIS — E1129 Type 2 diabetes mellitus with other diabetic kidney complication: Secondary | ICD-10-CM | POA: Diagnosis not present

## 2017-04-22 DIAGNOSIS — Z992 Dependence on renal dialysis: Secondary | ICD-10-CM | POA: Diagnosis not present

## 2017-04-22 DIAGNOSIS — N2581 Secondary hyperparathyroidism of renal origin: Secondary | ICD-10-CM | POA: Diagnosis not present

## 2017-04-22 DIAGNOSIS — D631 Anemia in chronic kidney disease: Secondary | ICD-10-CM | POA: Diagnosis not present

## 2017-04-25 DIAGNOSIS — D509 Iron deficiency anemia, unspecified: Secondary | ICD-10-CM | POA: Diagnosis not present

## 2017-04-25 DIAGNOSIS — D631 Anemia in chronic kidney disease: Secondary | ICD-10-CM | POA: Diagnosis not present

## 2017-04-25 DIAGNOSIS — E213 Hyperparathyroidism, unspecified: Secondary | ICD-10-CM | POA: Diagnosis not present

## 2017-04-25 DIAGNOSIS — E1129 Type 2 diabetes mellitus with other diabetic kidney complication: Secondary | ICD-10-CM | POA: Diagnosis not present

## 2017-04-25 DIAGNOSIS — N186 End stage renal disease: Secondary | ICD-10-CM | POA: Diagnosis not present

## 2017-04-25 DIAGNOSIS — N2581 Secondary hyperparathyroidism of renal origin: Secondary | ICD-10-CM | POA: Diagnosis not present

## 2017-04-27 DIAGNOSIS — N186 End stage renal disease: Secondary | ICD-10-CM | POA: Diagnosis not present

## 2017-04-27 DIAGNOSIS — E1129 Type 2 diabetes mellitus with other diabetic kidney complication: Secondary | ICD-10-CM | POA: Diagnosis not present

## 2017-04-27 DIAGNOSIS — E213 Hyperparathyroidism, unspecified: Secondary | ICD-10-CM | POA: Diagnosis not present

## 2017-04-27 DIAGNOSIS — D631 Anemia in chronic kidney disease: Secondary | ICD-10-CM | POA: Diagnosis not present

## 2017-04-27 DIAGNOSIS — N2581 Secondary hyperparathyroidism of renal origin: Secondary | ICD-10-CM | POA: Diagnosis not present

## 2017-04-27 DIAGNOSIS — D509 Iron deficiency anemia, unspecified: Secondary | ICD-10-CM | POA: Diagnosis not present

## 2017-04-29 DIAGNOSIS — E1129 Type 2 diabetes mellitus with other diabetic kidney complication: Secondary | ICD-10-CM | POA: Diagnosis not present

## 2017-04-29 DIAGNOSIS — Z7901 Long term (current) use of anticoagulants: Secondary | ICD-10-CM | POA: Diagnosis not present

## 2017-04-29 DIAGNOSIS — E213 Hyperparathyroidism, unspecified: Secondary | ICD-10-CM | POA: Diagnosis not present

## 2017-04-29 DIAGNOSIS — I4892 Unspecified atrial flutter: Secondary | ICD-10-CM | POA: Diagnosis not present

## 2017-04-29 DIAGNOSIS — N2581 Secondary hyperparathyroidism of renal origin: Secondary | ICD-10-CM | POA: Diagnosis not present

## 2017-04-29 DIAGNOSIS — D509 Iron deficiency anemia, unspecified: Secondary | ICD-10-CM | POA: Diagnosis not present

## 2017-04-29 DIAGNOSIS — N186 End stage renal disease: Secondary | ICD-10-CM | POA: Diagnosis not present

## 2017-04-29 DIAGNOSIS — D631 Anemia in chronic kidney disease: Secondary | ICD-10-CM | POA: Diagnosis not present

## 2017-05-02 DIAGNOSIS — D631 Anemia in chronic kidney disease: Secondary | ICD-10-CM | POA: Diagnosis not present

## 2017-05-02 DIAGNOSIS — D509 Iron deficiency anemia, unspecified: Secondary | ICD-10-CM | POA: Diagnosis not present

## 2017-05-02 DIAGNOSIS — E213 Hyperparathyroidism, unspecified: Secondary | ICD-10-CM | POA: Diagnosis not present

## 2017-05-02 DIAGNOSIS — E1129 Type 2 diabetes mellitus with other diabetic kidney complication: Secondary | ICD-10-CM | POA: Diagnosis not present

## 2017-05-02 DIAGNOSIS — N2581 Secondary hyperparathyroidism of renal origin: Secondary | ICD-10-CM | POA: Diagnosis not present

## 2017-05-02 DIAGNOSIS — N186 End stage renal disease: Secondary | ICD-10-CM | POA: Diagnosis not present

## 2017-05-04 DIAGNOSIS — E1129 Type 2 diabetes mellitus with other diabetic kidney complication: Secondary | ICD-10-CM | POA: Diagnosis not present

## 2017-05-04 DIAGNOSIS — N2581 Secondary hyperparathyroidism of renal origin: Secondary | ICD-10-CM | POA: Diagnosis not present

## 2017-05-04 DIAGNOSIS — E213 Hyperparathyroidism, unspecified: Secondary | ICD-10-CM | POA: Diagnosis not present

## 2017-05-04 DIAGNOSIS — D631 Anemia in chronic kidney disease: Secondary | ICD-10-CM | POA: Diagnosis not present

## 2017-05-04 DIAGNOSIS — N186 End stage renal disease: Secondary | ICD-10-CM | POA: Diagnosis not present

## 2017-05-04 DIAGNOSIS — D509 Iron deficiency anemia, unspecified: Secondary | ICD-10-CM | POA: Diagnosis not present

## 2017-05-05 ENCOUNTER — Encounter: Payer: Self-pay | Admitting: *Deleted

## 2017-05-05 ENCOUNTER — Encounter: Payer: Self-pay | Admitting: Vascular Surgery

## 2017-05-05 ENCOUNTER — Other Ambulatory Visit: Payer: Self-pay

## 2017-05-05 ENCOUNTER — Ambulatory Visit (INDEPENDENT_AMBULATORY_CARE_PROVIDER_SITE_OTHER): Payer: Medicare Other | Admitting: Vascular Surgery

## 2017-05-05 ENCOUNTER — Ambulatory Visit (HOSPITAL_COMMUNITY)
Admission: RE | Admit: 2017-05-05 | Discharge: 2017-05-05 | Disposition: A | Discharge status: Home / Self Care | Payer: Medicare Other | Source: Ambulatory Visit | Attending: Vascular Surgery | Admitting: Vascular Surgery

## 2017-05-05 ENCOUNTER — Other Ambulatory Visit: Payer: Self-pay | Admitting: *Deleted

## 2017-05-05 ENCOUNTER — Encounter (HOSPITAL_COMMUNITY): Payer: Self-pay | Admitting: *Deleted

## 2017-05-05 VITALS — BP 138/78 | HR 83 | Temp 97.9°F | Resp 20 | Ht 73.0 in | Wt 256.0 lb

## 2017-05-05 DIAGNOSIS — Z992 Dependence on renal dialysis: Secondary | ICD-10-CM | POA: Diagnosis not present

## 2017-05-05 DIAGNOSIS — Z48812 Encounter for surgical aftercare following surgery on the circulatory system: Secondary | ICD-10-CM | POA: Diagnosis not present

## 2017-05-05 DIAGNOSIS — N186 End stage renal disease: Secondary | ICD-10-CM

## 2017-05-05 DIAGNOSIS — E6609 Other obesity due to excess calories: Secondary | ICD-10-CM | POA: Diagnosis not present

## 2017-05-05 DIAGNOSIS — I5022 Chronic systolic (congestive) heart failure: Secondary | ICD-10-CM | POA: Diagnosis not present

## 2017-05-05 DIAGNOSIS — I120 Hypertensive chronic kidney disease with stage 5 chronic kidney disease or end stage renal disease: Secondary | ICD-10-CM | POA: Diagnosis not present

## 2017-05-05 DIAGNOSIS — I2510 Atherosclerotic heart disease of native coronary artery without angina pectoris: Secondary | ICD-10-CM | POA: Diagnosis not present

## 2017-05-05 NOTE — H&P (View-Only) (Signed)
Patient is a 56 year old male who presents today for evaluation of an ulcerated left radiocephalic AV fistula.  He is on Coumadin for atrial fibrillation.  He has not had any prior bleeding episodes.  He had plication of a separate section of the AV fistula by me March 2018.  He states he was told to stop his warfarin earlier this week by his dialysis center in anticipation that he would need an operation for his fistula.  Review of systems: He denies shortness of breath.  He denies chest pain.  Past Medical History:  Diagnosis Date  . Atrial flutter ([place])   . 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 Cardiomyopathy   . CHF (congestive heart failure) ([place])   . Coronary artery disease    normal coronaries by 10/10/08 cath, Cardiac Cath 08-04-12 epic.Dr. Doylene Canard follows  . Diabetes mellitus    pt. states he's borderline diabetic., no longer taking med,- off med. since 2013  . Dialysis patient [facility])    Mon-Wed-Fri([place])- Left AV fistula  . Diverticulitis November 2016   reoccurred in December 2016  . ESRD (end stage renal disease) ([place])   . GERD (gastroesophageal reflux disease)   . Gout   . History of nephrectomy 07/04/2012   History of right nephrectomy in 2002 for renal cell carcinoma   . History of unilateral nephrectomy   . Hypertension   . Low iron   . Myocardial infarction [facility]) ?2006  . Renal cell carcinoma    dialysis- MWF- Dr. Mercy Moore follows.  . Renal insufficiency   . Shortness of breath 05/19/11   "at rest, lying down, w/exertion"  . Stroke [facility]) 02/2011   05/19/11 denies residual  . Umbilical hernia 02/77/41   unrepaired    Past Surgical History:  Procedure Laterality Date  . AV FISTULA PLACEMENT  03/29/2011   Procedure: ARTERIOVENOUS (AV) FISTULA CREATION;  Surgeon: Hinda Lenis, MD;  Location: [place];  Service: Vascular;  Laterality: Left;  LEFT RADIOCEPHALIC , Arteriovenous OINOMVE(72094)  . CARDIAC CATHETERIZATION   05/21/11  . CARDIAC CATHETERIZATION N/A 03/16/2016   Procedure: Left Heart Cath and Coronary Angiography;  Surgeon: Dixie Dials, MD;  Location: [place] CV LAB;  Service: Cardiovascular;  Laterality: N/A;  . CHOLECYSTECTOMY N/A 02/12/2016   Procedure: LAPAROSCOPIC CHOLECYSTECTOMY WITH INTRAOPERATIVE CHOLANGIOGRAM;  Surgeon: Jackolyn Confer, MD;  Location: [place];  Service: General;  Laterality: N/A;  . COLONOSCOPY WITH PROPOFOL N/A 02/01/2017   Procedure: COLONOSCOPY WITH PROPOFOL;  Surgeon: Juanita Craver, MD;  [facility];  Service: Endoscopy;  Laterality: N/A;  . dialysis cath placed    . ESOPHAGOGASTRODUODENOSCOPY (EGD) WITH PROPOFOL N/A 12/02/2015   Procedure: ESOPHAGOGASTRODUODENOSCOPY (EGD) WITH PROPOFOL;  Surgeon: Juanita Craver, MD;  [facility];  Service: Endoscopy;  Laterality: N/A;  . FINGER SURGERY     L pinkie finger- ORIF- /w remaining hardware - 1990's    . HERNIA REPAIR N/A 11/29/6281   Umbilical hernia repair  . INSERTION OF DIALYSIS CATHETER N/A 01/27/2016   Procedure: INSERTION OF DIALYSIS CATHETER;  Surgeon: Waynetta Sandy, MD;  Location: [place];  Service: Vascular;  Laterality: N/A;  . INSERTION OF MESH N/A 07/04/2012   Procedure: INSERTION OF MESH;  Surgeon: Madilyn Hook, DO;  Location: [place];  Service: General;  Laterality: N/A;  . LAPAROSCOPIC LYSIS OF ADHESIONS N/A 02/12/2016   Procedure: LAPAROSCOPIC LYSIS OF ADHESIONS;  Surgeon: Jackolyn Confer, MD;  Location: [place];  Service: General;  Laterality: N/A;  . LEFT AND RIGHT HEART CATHETERIZATION WITH CORONARY ANGIOGRAM N/A 08/04/2012   Procedure: LEFT AND RIGHT HEART CATHETERIZATION WITH CORONARY ANGIOGRAM;  Surgeon: Birdie Riddle, MD;  Location: [place] CATH LAB;  Service: Cardiovascular;  Laterality: N/A;  . NEPHRECTOMY  2000   right  . REVISON OF ARTERIOVENOUS FISTULA Left 06/05/2013   Procedure: REVISON OF LEFT RADIOCEPHALIC  ARTERIOVENOUS FISTULA;  Surgeon: Conrad [place], MD;  Location: [place];   Service: Vascular;  Laterality: Left;  . REVISON OF ARTERIOVENOUS FISTULA Left 01/27/2016   Procedure: REVISION OF LEFT UPPER EXTREMITY ARTERIOVENOUS FISTULA;  Surgeon: Waynetta Sandy, MD;  Location: [place];  Service: Vascular;  Laterality: Left;  . REVISON OF ARTERIOVENOUS FISTULA Left 08/03/2016   Procedure: REVISON OF Left arm ARTERIOVENOUS FISTULA;  Surgeon: Elam Dutch, MD;  Location: [place];  Service: Vascular;  Laterality: Left;  . RIGHT HEART CATHETERIZATION N/A 05/21/2011   Procedure: RIGHT HEART CATH;  Surgeon: Birdie Riddle, MD;  Location: [facility] CATH LAB;  Service: Cardiovascular;  Laterality: N/A;  . smashed  1990's   "left pinky; have a plate in there"  . UMBILICAL HERNIA REPAIR N/A 07/04/2012   Procedure: LAPAROSCOPIC UMBILICAL HERNIA;  Surgeon: Madilyn Hook, DO;  Location: [place];  Service: General;  Laterality: N/A;  . US ECHOCARDIOGRAPHY  05/20/11     Current Outpatient Medications on File Prior to Visit  Medication Sig Dispense Refill  . amiodarone (PACERONE) 200 MG tablet Take 0.5 tablets (100 mg total) by mouth daily. 15 tablet 6  . calcium acetate (PHOSLO) 667 MG capsule Take 2,001 mg by mouth 3 (three) times daily with meals.     . chlorhexidine (PERIDEX) 0.12 % solution RINSE WITH 1/2 OZ TWICE A DAY (WITH ORAL HYGIENE ROUTINE)  12  . cholecalciferol (VITAMIN D) 1000 units tablet Take 1,000 Units by mouth daily.    . cinacalcet (SENSIPAR) 90 MG tablet Take 90 mg by mouth. Pt receives at [place]    . lidocaine-prilocaine (EMLA) cream Apply 1 application topically every Monday, Wednesday, and Friday. Apply to port access prior to dialysis  12  . midodrine (PROAMATINE) 10 MG tablet Take 0.5 tablets (5 mg total) by mouth every Monday, Wednesday, and Friday with hemodialysis. Take on the morning of dialysis days 30 tablet 1  . nitroGLYCERIN (NITROSTAT) 0.4 MG SL tablet Place 0.4 mg under the tongue every 5 (five) minutes as needed for chest pain.   0  .  oxyCODONE-acetaminophen (PERCOCET/ROXICET) 5-325 MG tablet Take 1 tablet by mouth daily as needed for pain.  0  . sevelamer carbonate (RENVELA) 800 MG tablet Take 2-3 tablets (1,600-2,400 mg total) by mouth See admin instructions. 1600 mg with snacks and 2400 mg with meals    . warfarin (COUMADIN) 10 MG tablet Take 15-20 mg by mouth See admin instructions. Take 15mg  all days except on dialysis days (M-W-F ) take 20mg .  1   No current facility-administered medications on file prior to visit.    Physical exam:  Vitals:   05/05/17 1213  BP: 138/78  Pulse: 83  Resp: 20  Temp: 97.9 F (36.6 C)  TempSrc: Oral  SpO2: 100%  Weight: 256 lb (116.1 kg)  Height: 6\' 1"  (1.854 m)    Left upper extremity: Left radiocephalic AV fistula with palpable thrill.  Well-healed wrist and an additional incision just adjacent to this.  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  There are 2 areas of dark colored eschar over the fistula.  Data: Patient had a duplex ultrasound of his AV fistula today which shows the fistula diameter is 8-26 mm.  There is no significant narrowing.  Assessment: Aneurysmal degeneration left arm AV fistula at risk for bleeding.  Plan: The patient will be scheduled for plication of this area by Dr. Donnetta Hutching tomorrow.  We will check his INR preoperatively.  However, he has been off his Coumadin for about 4 days and he should be close to normal on his INR by this point.  I did discuss with the patient today how to control bleeding if he has any problems this evening.  He will hold pressure over the fistula and call EMS.  Ruta Hinds, MD [facility] [place] Office: [phone] Pager: [phone]   Addendum:  The patient has been re-examined and re-evaluated.  The patient's history and physical has been reviewed and is unchanged.    [name] is a 56 y.o. male is being admitted with No admission  diagnoses are documented for this encounter.. All the risks, benefits and other treatment options have been discussed with the patient. The patient has consented to proceed with  as a surgical intervention.  Roarke Marciano 05/06/2017 12:57 PM V[facility] Vein [facility]

## 2017-05-05 NOTE — Progress Notes (Signed)
Pt denies any acute cardiopulmonary issues. Pt under the care of Dr. Doylene Canard, Cardiology. Pt stated that last dose of Coumadin was " 4-5 days ago." Pt made aware to check BG every 2 hours prior to arrival to hospital on DOS. Pt made aware to treat a BG < 70 with  4 ounces of apple juice, wait 15 minutes after intervention to recheck BG, if BG remains < 70, call Short Stay unit to speak with a nurse. Pt verbalized understanding of all pre-op instructions. Durel Salts, NP reviewed Dr. Clayborne Dana cardiology note; no new orders given.

## 2017-05-05 NOTE — Progress Notes (Signed)
Patient is a 56 year old male who presents today for evaluation of an ulcerated left radiocephalic AV fistula.  He is on Coumadin for atrial fibrillation.  He has not had any prior bleeding episodes.  He had plication of a separate section of the AV fistula by me March 2018.  He states he was told to stop his warfarin earlier this week by his dialysis center in anticipation that he would need an operation for his fistula.  Review of systems: He denies shortness of breath.  He denies chest pain.  Past Medical History:  Diagnosis Date  . Atrial flutter ([place])   . 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 Cardiomyopathy   . CHF (congestive heart failure) ([place])   . Coronary artery disease    normal coronaries by 10/10/08 cath, Cardiac Cath 08-04-12 epic.Dr. Doylene Canard follows  . Diabetes mellitus    pt. states he's borderline diabetic., no longer taking med,- off med. since 2013  . Dialysis patient [facility])    Mon-Wed-Fri([place])- Left AV fistula  . Diverticulitis November 2016   reoccurred in December 2016  . ESRD (end stage renal disease) ([place])   . GERD (gastroesophageal reflux disease)   . Gout   . History of nephrectomy 07/04/2012   History of right nephrectomy in 2002 for renal cell carcinoma   . History of unilateral nephrectomy   . Hypertension   . Low iron   . Myocardial infarction [facility]) ?2006  . Renal cell carcinoma    dialysis- MWF- Dr. Mercy Moore follows.  . Renal insufficiency   . Shortness of breath 05/19/11   "at rest, lying down, w/exertion"  . Stroke [facility]) 02/2011   05/19/11 denies residual  . Umbilical hernia 96/75/91   unrepaired    Past Surgical History:  Procedure Laterality Date  . AV FISTULA PLACEMENT  03/29/2011   Procedure: ARTERIOVENOUS (AV) FISTULA CREATION;  Surgeon: Hinda Lenis, MD;  Location: [place];  Service: Vascular;  Laterality: Left;  LEFT RADIOCEPHALIC , Arteriovenous [id number])  . CARDIAC CATHETERIZATION   05/21/11  . CARDIAC CATHETERIZATION N/A 03/16/2016   Procedure: Left Heart Cath and Coronary Angiography;  Surgeon: Dixie Dials, MD;  Location: [place] CV LAB;  Service: Cardiovascular;  Laterality: N/A;  . CHOLECYSTECTOMY N/A 02/12/2016   Procedure: LAPAROSCOPIC CHOLECYSTECTOMY WITH INTRAOPERATIVE CHOLANGIOGRAM;  Surgeon: Jackolyn Confer, MD;  Location: [place];  Service: General;  Laterality: N/A;  . COLONOSCOPY WITH PROPOFOL N/A 02/01/2017   Procedure: COLONOSCOPY WITH PROPOFOL;  Surgeon: Juanita Craver, MD;  [facility];  Service: Endoscopy;  Laterality: N/A;  . dialysis cath placed    . ESOPHAGOGASTRODUODENOSCOPY (EGD) WITH PROPOFOL N/A 12/02/2015   Procedure: ESOPHAGOGASTRODUODENOSCOPY (EGD) WITH PROPOFOL;  Surgeon: Juanita Craver, MD;  [facility];  Service: Endoscopy;  Laterality: N/A;  . FINGER SURGERY     L pinkie finger- ORIF- /w remaining hardware - 1990's    . HERNIA REPAIR N/A 0/17/7939   Umbilical hernia repair  . INSERTION OF DIALYSIS CATHETER N/A 01/27/2016   Procedure: INSERTION OF DIALYSIS CATHETER;  Surgeon: Waynetta Sandy, MD;  Location: [place];  Service: Vascular;  Laterality: N/A;  . INSERTION OF MESH N/A 07/04/2012   Procedure: INSERTION OF MESH;  Surgeon: Madilyn Hook, DO;  Location: [place];  Service: General;  Laterality: N/A;  . LAPAROSCOPIC LYSIS OF ADHESIONS N/A 02/12/2016   Procedure: LAPAROSCOPIC LYSIS OF ADHESIONS;  Surgeon: Jackolyn Confer, MD;  Location: [place];  Service: General;  Laterality: N/A;  . LEFT AND RIGHT HEART CATHETERIZATION WITH CORONARY ANGIOGRAM N/A 08/04/2012   Procedure: LEFT AND RIGHT HEART CATHETERIZATION WITH CORONARY ANGIOGRAM;  Surgeon: Birdie Riddle, MD;  Location: [place] CATH LAB;  Service: Cardiovascular;  Laterality: N/A;  . NEPHRECTOMY  2000   right  . REVISON OF ARTERIOVENOUS FISTULA Left 06/05/2013   Procedure: REVISON OF LEFT RADIOCEPHALIC  ARTERIOVENOUS FISTULA;  Surgeon: Conrad [place], MD;  Location: [place];   Service: Vascular;  Laterality: Left;  . REVISON OF ARTERIOVENOUS FISTULA Left 01/27/2016   Procedure: REVISION OF LEFT UPPER EXTREMITY ARTERIOVENOUS FISTULA;  Surgeon: Waynetta Sandy, MD;  Location: [place];  Service: Vascular;  Laterality: Left;  . REVISON OF ARTERIOVENOUS FISTULA Left 08/03/2016   Procedure: REVISON OF Left arm ARTERIOVENOUS FISTULA;  Surgeon: Elam [place], MD;  Location: [place];  Service: Vascular;  Laterality: Left;  . RIGHT HEART CATHETERIZATION N/A 05/21/2011   Procedure: RIGHT HEART CATH;  Surgeon: Birdie Riddle, MD;  Location: [facility] CATH LAB;  Service: Cardiovascular;  Laterality: N/A;  . smashed  1990's   "left pinky; have a plate in there"  . UMBILICAL HERNIA REPAIR N/A 07/04/2012   Procedure: LAPAROSCOPIC UMBILICAL HERNIA;  Surgeon: Madilyn Hook, DO;  Location: [place];  Service: General;  Laterality: N/A;  . US ECHOCARDIOGRAPHY  05/20/11     Current Outpatient Medications on File Prior to Visit  Medication Sig Dispense Refill  . amiodarone (PACERONE) 200 MG tablet Take 0.5 tablets (100 mg total) by mouth daily. 15 tablet 6  . calcium acetate (PHOSLO) 667 MG capsule Take 2,001 mg by mouth 3 (three) times daily with meals.     . chlorhexidine (PERIDEX) 0.12 % solution RINSE WITH 1/2 OZ TWICE A DAY (WITH ORAL HYGIENE ROUTINE)  12  . cholecalciferol (VITAMIN D) 1000 units tablet Take 1,000 Units by mouth daily.    . cinacalcet (SENSIPAR) 90 MG tablet Take 90 mg by mouth. Pt receives at [place]    . lidocaine-prilocaine (EMLA) cream Apply 1 application topically every Monday, Wednesday, and Friday. Apply to port access prior to dialysis  12  . midodrine (PROAMATINE) 10 MG tablet Take 0.5 tablets (5 mg total) by mouth every Monday, Wednesday, and Friday with hemodialysis. Take on the morning of dialysis days 30 tablet 1  . nitroGLYCERIN (NITROSTAT) 0.4 MG SL tablet Place 0.4 mg under the tongue every 5 (five) minutes as needed for chest pain.   0  .  oxyCODONE-acetaminophen (PERCOCET/ROXICET) 5-325 MG tablet Take 1 tablet by mouth daily as needed for pain.  0  . sevelamer carbonate (RENVELA) 800 MG tablet Take 2-3 tablets (1,600-2,400 mg total) by mouth See admin instructions. 1600 mg with snacks and 2400 mg with meals    . warfarin (COUMADIN) 10 MG tablet Take 15-20 mg by mouth See admin instructions. Take 15mg  all days except on dialysis days (M-W-F ) take 20mg .  1   No current facility-administered medications on file prior to visit.    Physical exam:  Vitals:   05/05/17 1213  BP: 138/78  Pulse: 83  Resp: 20  Temp: 97.9 F (36.6 C)  TempSrc: Oral  SpO2: 100%  Weight: 256 lb (116.1 kg)  Height: 6\' 1"  (1.854 m)    Left upper extremity: Left radiocephalic AV fistula with palpable thrill.  Well-healed wrist and an additional incision just adjacent to this.  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  There are 2 areas of dark colored eschar over the fistula.  Data: Patient had a duplex ultrasound of his AV fistula today which shows the fistula diameter is 8-26 mm.  There is no significant narrowing.  Assessment: Aneurysmal degeneration left arm AV fistula at risk for bleeding.  Plan: The patient will be scheduled for plication of this area by Dr. Donnetta Stephenson tomorrow.  We will check his INR preoperatively.  However, he has been off his Coumadin for about 4 days and he should be close to normal on his INR by this point.  I did discuss with the patient today how to control bleeding if he has any problems this evening.  He will hold pressure over the fistula and call EMS.  Matthew Hinds, MD [facility] [place] Office: [phone] Pager: [phone]   Addendum:  The patient has been re-examined and re-evaluated.  The patient's history and physical has been reviewed and is unchanged.    [name] is a 56 y.o. male is being admitted with No admission  diagnoses are documented for this encounter.. All the risks, benefits and other treatment options have been discussed with the patient. The patient has consented to proceed with  as a surgical intervention.  [name] 05/06/2017 12:57 PM [facility]

## 2017-05-06 ENCOUNTER — Encounter (HOSPITAL_COMMUNITY): Payer: Self-pay | Admitting: *Deleted

## 2017-05-06 ENCOUNTER — Ambulatory Visit (HOSPITAL_COMMUNITY)
Admission: RE | Admit: 2017-05-06 | Discharge: 2017-05-06 | Disposition: A | Discharge status: Home / Self Care | Payer: Medicare Other | Source: Ambulatory Visit | Attending: Vascular Surgery | Admitting: Vascular Surgery

## 2017-05-06 ENCOUNTER — Ambulatory Visit (HOSPITAL_COMMUNITY): Payer: Medicare Other | Admitting: Anesthesiology

## 2017-05-06 ENCOUNTER — Encounter (HOSPITAL_COMMUNITY)
Admission: RE | Disposition: A | Discharge status: Home / Self Care | Payer: Self-pay | Source: Ambulatory Visit | Attending: Vascular Surgery

## 2017-05-06 DIAGNOSIS — T82898A Other specified complication of vascular prosthetic devices, implants and grafts, initial encounter: Secondary | ICD-10-CM | POA: Diagnosis not present

## 2017-05-06 DIAGNOSIS — H5461 Unqualified visual loss, right eye, normal vision left eye: Secondary | ICD-10-CM | POA: Insufficient documentation

## 2017-05-06 DIAGNOSIS — Z905 Acquired absence of kidney: Secondary | ICD-10-CM | POA: Diagnosis not present

## 2017-05-06 DIAGNOSIS — E213 Hyperparathyroidism, unspecified: Secondary | ICD-10-CM | POA: Diagnosis not present

## 2017-05-06 DIAGNOSIS — I132 Hypertensive heart and chronic kidney disease with heart failure and with stage 5 chronic kidney disease, or end stage renal disease: Secondary | ICD-10-CM | POA: Diagnosis not present

## 2017-05-06 DIAGNOSIS — Z8673 Personal history of transient ischemic attack (TIA), and cerebral infarction without residual deficits: Secondary | ICD-10-CM | POA: Diagnosis not present

## 2017-05-06 DIAGNOSIS — Y832 Surgical operation with anastomosis, bypass or graft as the cause of abnormal reaction of the patient, or of later complication, without mention of misadventure at the time of the procedure: Secondary | ICD-10-CM | POA: Insufficient documentation

## 2017-05-06 DIAGNOSIS — Z7901 Long term (current) use of anticoagulants: Secondary | ICD-10-CM | POA: Insufficient documentation

## 2017-05-06 DIAGNOSIS — N2581 Secondary hyperparathyroidism of renal origin: Secondary | ICD-10-CM | POA: Diagnosis not present

## 2017-05-06 DIAGNOSIS — I252 Old myocardial infarction: Secondary | ICD-10-CM | POA: Diagnosis not present

## 2017-05-06 DIAGNOSIS — Z992 Dependence on renal dialysis: Secondary | ICD-10-CM | POA: Insufficient documentation

## 2017-05-06 DIAGNOSIS — I429 Cardiomyopathy, unspecified: Secondary | ICD-10-CM | POA: Diagnosis not present

## 2017-05-06 DIAGNOSIS — Z79899 Other long term (current) drug therapy: Secondary | ICD-10-CM | POA: Diagnosis not present

## 2017-05-06 DIAGNOSIS — N186 End stage renal disease: Secondary | ICD-10-CM | POA: Diagnosis not present

## 2017-05-06 DIAGNOSIS — Z85528 Personal history of other malignant neoplasm of kidney: Secondary | ICD-10-CM | POA: Diagnosis not present

## 2017-05-06 DIAGNOSIS — I2510 Atherosclerotic heart disease of native coronary artery without angina pectoris: Secondary | ICD-10-CM | POA: Insufficient documentation

## 2017-05-06 DIAGNOSIS — E1129 Type 2 diabetes mellitus with other diabetic kidney complication: Secondary | ICD-10-CM | POA: Diagnosis not present

## 2017-05-06 DIAGNOSIS — D631 Anemia in chronic kidney disease: Secondary | ICD-10-CM | POA: Diagnosis not present

## 2017-05-06 DIAGNOSIS — I509 Heart failure, unspecified: Secondary | ICD-10-CM | POA: Insufficient documentation

## 2017-05-06 DIAGNOSIS — E1122 Type 2 diabetes mellitus with diabetic chronic kidney disease: Secondary | ICD-10-CM | POA: Insufficient documentation

## 2017-05-06 DIAGNOSIS — K219 Gastro-esophageal reflux disease without esophagitis: Secondary | ICD-10-CM | POA: Insufficient documentation

## 2017-05-06 DIAGNOSIS — I120 Hypertensive chronic kidney disease with stage 5 chronic kidney disease or end stage renal disease: Secondary | ICD-10-CM | POA: Diagnosis not present

## 2017-05-06 DIAGNOSIS — I729 Aneurysm of unspecified site: Secondary | ICD-10-CM | POA: Diagnosis not present

## 2017-05-06 DIAGNOSIS — D509 Iron deficiency anemia, unspecified: Secondary | ICD-10-CM | POA: Diagnosis not present

## 2017-05-06 DIAGNOSIS — T82590A Other mechanical complication of surgically created arteriovenous fistula, initial encounter: Secondary | ICD-10-CM | POA: Diagnosis not present

## 2017-05-06 HISTORY — PX: REVISON OF ARTERIOVENOUS FISTULA: SHX6074

## 2017-05-06 LAB — GLUCOSE, CAPILLARY
Glucose-Capillary: 86 mg/dL (ref 65–99)
Glucose-Capillary: 84 mg/dL (ref 65–99)
Glucose-Capillary: 62 mg/dL — ABNORMAL LOW (ref 65–99)
Glucose-Capillary: 94 mg/dL (ref 65–99)
Glucose-Capillary: 69 mg/dL (ref 65–99)
Glucose-Capillary: 116 mg/dL — ABNORMAL HIGH (ref 65–99)
Glucose-Capillary: 61 mg/dL — ABNORMAL LOW (ref 65–99)
Glucose-Capillary: 101 mg/dL — ABNORMAL HIGH (ref 65–99)
Glucose-Capillary: 77 mg/dL (ref 65–99)

## 2017-05-06 LAB — PROTIME-INR
INR: 0.97
Prothrombin Time: 12.7 seconds (ref 11.4–15.2)

## 2017-05-06 LAB — POTASSIUM: Potassium: 3.9 mmol/L (ref 3.5–5.1)

## 2017-05-06 SURGERY — REVISON OF ARTERIOVENOUS FISTULA
Anesthesia: General | Laterality: Left

## 2017-05-06 MED ORDER — ROCURONIUM BROMIDE 10 MG/ML (PF) SYRINGE
PREFILLED_SYRINGE | INTRAVENOUS | Status: AC
  Filled 2017-05-06: qty 5

## 2017-05-06 MED ORDER — PROPOFOL 10 MG/ML IV BOLUS
INTRAVENOUS | Status: DC | PRN
  Administered 2017-05-06: 150 mg via INTRAVENOUS

## 2017-05-06 MED ORDER — DEXTROSE 50 % IV SOLN
25.0000 mL | Freq: Once | INTRAVENOUS | Status: AC
  Administered 2017-05-06: 25 mL via INTRAVENOUS

## 2017-05-06 MED ORDER — DEXTROSE 50 % IV SOLN
1.0000 | Freq: Once | INTRAVENOUS | Status: AC
  Administered 2017-05-06: 50 mL via INTRAVENOUS

## 2017-05-06 MED ORDER — MIDAZOLAM HCL 2 MG/2ML IJ SOLN
INTRAMUSCULAR | Status: AC
  Filled 2017-05-06: qty 2

## 2017-05-06 MED ORDER — PROPOFOL 10 MG/ML IV BOLUS
INTRAVENOUS | Status: AC
  Filled 2017-05-06: qty 20

## 2017-05-06 MED ORDER — LIDOCAINE 2% (20 MG/ML) 5 ML SYRINGE
INTRAMUSCULAR | Status: AC
  Filled 2017-05-06: qty 5

## 2017-05-06 MED ORDER — FENTANYL CITRATE (PF) 250 MCG/5ML IJ SOLN
INTRAMUSCULAR | Status: AC
  Filled 2017-05-06: qty 5

## 2017-05-06 MED ORDER — PHENYLEPHRINE HCL 10 MG/ML IJ SOLN
INTRAMUSCULAR | Status: DC | PRN
  Administered 2017-05-06: 200 ug via INTRAVENOUS
  Administered 2017-05-06: 120 ug via INTRAVENOUS
  Administered 2017-05-06 (×2): 80 ug via INTRAVENOUS
  Administered 2017-05-06: 120 ug via INTRAVENOUS

## 2017-05-06 MED ORDER — DEXTROSE 50 % IV SOLN
INTRAVENOUS | Status: AC
  Administered 2017-05-06: 50 mL via INTRAVENOUS
  Filled 2017-05-06: qty 50

## 2017-05-06 MED ORDER — ONDANSETRON HCL 4 MG/2ML IJ SOLN
INTRAMUSCULAR | Status: DC | PRN
  Administered 2017-05-06: 4 mg via INTRAVENOUS

## 2017-05-06 MED ORDER — PHENYLEPHRINE HCL 10 MG/ML IJ SOLN
INTRAVENOUS | Status: DC | PRN
  Administered 2017-05-06: 40 ug/min via INTRAVENOUS

## 2017-05-06 MED ORDER — SODIUM CHLORIDE 0.9 % IV SOLN
INTRAVENOUS | Status: DC
  Administered 2017-05-06 (×2): via INTRAVENOUS

## 2017-05-06 MED ORDER — ALBUMIN HUMAN 5 % IV SOLN
INTRAVENOUS | Status: DC | PRN
  Administered 2017-05-06 (×2): via INTRAVENOUS

## 2017-05-06 MED ORDER — LIDOCAINE-EPINEPHRINE 0.5 %-1:200000 IJ SOLN
INTRAMUSCULAR | Status: AC
Start: 2017-05-06 — End: ?
  Filled 2017-05-06: qty 1

## 2017-05-06 MED ORDER — CEFUROXIME SODIUM 1.5 G IV SOLR
1.5000 g | INTRAVENOUS | Status: AC
  Administered 2017-05-06: 1.5 g via INTRAVENOUS
  Filled 2017-05-06: qty 1.5

## 2017-05-06 MED ORDER — DEXTROSE 50 % IV SOLN
INTRAVENOUS | Status: AC
  Administered 2017-05-06: 25 mL via INTRAVENOUS
  Filled 2017-05-06: qty 50

## 2017-05-06 MED ORDER — LIDOCAINE HCL (CARDIAC) 20 MG/ML IV SOLN
INTRAVENOUS | Status: DC | PRN
  Administered 2017-05-06: 100 mg via INTRAVENOUS

## 2017-05-06 MED ORDER — SODIUM CHLORIDE 0.9 % IR SOLN
Status: DC | PRN
  Administered 2017-05-06: 1000 mL

## 2017-05-06 MED ORDER — DEXTROSE 50 % IV SOLN
0.5000 | Freq: Once | INTRAVENOUS | Status: AC
  Administered 2017-05-06: 25 mL via INTRAVENOUS

## 2017-05-06 MED ORDER — FENTANYL CITRATE (PF) 100 MCG/2ML IJ SOLN
INTRAMUSCULAR | Status: DC | PRN
  Administered 2017-05-06 (×2): 50 ug via INTRAVENOUS

## 2017-05-06 MED ORDER — MIDAZOLAM HCL 5 MG/5ML IJ SOLN
INTRAMUSCULAR | Status: DC | PRN
  Administered 2017-05-06: 2 mg via INTRAVENOUS

## 2017-05-06 MED ORDER — OXYCODONE-ACETAMINOPHEN 5-325 MG PO TABS: 1.0000 | ORAL_TABLET | Freq: Four times a day (QID) | ORAL | 0 refills | Status: AC | PRN

## 2017-05-06 MED ORDER — SODIUM CHLORIDE 0.9 % IV SOLN
INTRAVENOUS | Status: DC | PRN
  Administered 2017-05-06: 500 mL

## 2017-05-06 SURGICAL SUPPLY — 32 items
ADH SKN CLS APL DERMABOND .7 (GAUZE/BANDAGES/DRESSINGS) ×1
ARMBAND PINK RESTRICT EXTREMIT (MISCELLANEOUS) ×2 IMPLANT
CANISTER SUCT 3000ML PPV (MISCELLANEOUS) ×2 IMPLANT
CANNULA VESSEL 3MM 2 BLNT TIP (CANNULA) ×2 IMPLANT
CLIP LIGATING EXTRA MED SLVR (CLIP) ×2 IMPLANT
CLIP LIGATING EXTRA SM BLUE (MISCELLANEOUS) ×2 IMPLANT
COVER PROBE W GEL 5X96 (DRAPES) IMPLANT
DECANTER SPIKE VIAL GLASS SM (MISCELLANEOUS) ×2 IMPLANT
DERMABOND ADVANCED (GAUZE/BANDAGES/DRESSINGS) ×1
DERMABOND ADVANCED .7 DNX12 (GAUZE/BANDAGES/DRESSINGS) ×1 IMPLANT
ELECT REM PT RETURN 9FT ADLT (ELECTROSURGICAL) ×2
ELECTRODE REM PT RTRN 9FT ADLT (ELECTROSURGICAL) ×1 IMPLANT
GLOVE BIO SURGEON STRL SZ7.5 (GLOVE) ×1 IMPLANT
GLOVE BIOGEL PI IND STRL 6.5 (GLOVE) IMPLANT
GLOVE BIOGEL PI INDICATOR 6.5 (GLOVE) ×3
GLOVE ECLIPSE 6.5 STRL STRAW (GLOVE) ×2 IMPLANT
GLOVE SS BIOGEL STRL SZ 7.5 (GLOVE) ×1 IMPLANT
GLOVE SUPERSENSE BIOGEL SZ 7.5 (GLOVE) ×1
GOWN STRL REUS W/ TWL LRG LVL3 (GOWN DISPOSABLE) ×3 IMPLANT
GOWN STRL REUS W/TWL LRG LVL3 (GOWN DISPOSABLE) ×8
KIT BASIN OR (CUSTOM PROCEDURE TRAY) ×2 IMPLANT
KIT ROOM TURNOVER OR (KITS) ×2 IMPLANT
NS IRRIG 1000ML POUR BTL (IV SOLUTION) ×2 IMPLANT
PACK CV ACCESS (CUSTOM PROCEDURE TRAY) ×2 IMPLANT
PAD ARMBOARD 7.5X6 YLW CONV (MISCELLANEOUS) ×4 IMPLANT
SUT PROLENE 5 0 C 1 24 (SUTURE) ×1 IMPLANT
SUT PROLENE 6 0 CC (SUTURE) ×2 IMPLANT
SUT VIC AB 3-0 SH 27 (SUTURE) ×2
SUT VIC AB 3-0 SH 27X BRD (SUTURE) ×1 IMPLANT
TOWEL GREEN STERILE (TOWEL DISPOSABLE) ×2 IMPLANT
UNDERPAD 30X30 (UNDERPADS AND DIAPERS) ×2 IMPLANT
WATER STERILE IRR 1000ML POUR (IV SOLUTION) ×2 IMPLANT

## 2017-05-06 NOTE — Anesthesia Preprocedure Evaluation (Addendum)
Anesthesia Evaluation  Patient identified by MRN, date of birth, ID band Patient awake    Reviewed: Allergy & Precautions, H&P , NPO status , Patient's Chart, lab work & pertinent test results  Airway Mallampati: II   Neck ROM: full    Dental   Pulmonary shortness of breath,    breath sounds clear to auscultation       Cardiovascular hypertension, + CAD, + Past MI and +CHF   Rhythm:regular Rate:Normal     Neuro/Psych CVA    GI/Hepatic GERD  ,  Endo/Other  diabetes, Type 2obese  Renal/GU ESRF and DialysisRenal disease     Musculoskeletal   Abdominal   Peds  Hematology   Anesthesia Other Findings   Reproductive/Obstetrics                             Anesthesia Physical Anesthesia Plan  ASA: IV  Anesthesia Plan: General   Post-op Pain Management:    Induction: Intravenous  PONV Risk Score and Plan: 2 and Ondansetron, Dexamethasone, Midazolam and Treatment may vary due to age or medical condition  Airway Management Planned: LMA  Additional Equipment:   Intra-op Plan:   Post-operative Plan:   Informed Consent: I have reviewed the patients History and Physical, chart, labs and discussed the procedure including the risks, benefits and alternatives for the proposed anesthesia with the patient or authorized representative who has indicated his/her understanding and acceptance.     Plan Discussed with: CRNA, Anesthesiologist and Surgeon  Anesthesia Plan Comments:         Anesthesia Quick Evaluation

## 2017-05-06 NOTE — Discharge Instructions (Signed)
Follow up with Dr. Donnetta Hutching as needed

## 2017-05-06 NOTE — Interval H&P Note (Signed)
History and Physical Interval Note:  05/06/2017 3:38 PM  [name]  has presented today for surgery, with the diagnosis of fistula pseudoaneurysm  The various methods of treatment have been discussed with the patient and family. After consideration of risks, benefits and other options for treatment, the patient has consented to  Procedure(s): REVISION PLICATION OF ARTERIOVENOUS FISTULA LEFT ARM (Left) as a surgical intervention .  The patient's history has been reviewed, patient examined, no change in status, stable for surgery.  I have reviewed the patient's chart and labs.  Questions were answered to the patient's satisfaction.     Curt Jews

## 2017-05-06 NOTE — Progress Notes (Signed)
Called down to the lab looking for Potassium results.  Lab stated that they are looking for those results and would call me back left them a call back number

## 2017-05-06 NOTE — Anesthesia Procedure Notes (Signed)
Procedure Name: LMA Insertion Date/Time: 05/06/2017 4:14 PM Performed by: Lance Coon, CRNA Pre-anesthesia Checklist: Patient identified, Emergency Drugs available, Suction available, Timeout performed and Patient being monitored Patient Re-evaluated:Patient Re-evaluated prior to induction Oxygen Delivery Method: Circle system utilized Preoxygenation: Pre-oxygenation with 100% oxygen Induction Type: IV induction LMA: LMA inserted LMA Size: 5.0 Number of attempts: 1 Placement Confirmation: positive ETCO2 and breath sounds checked- equal and bilateral Tube secured with: Tape Dental Injury: Teeth and Oropharynx as per pre-operative assessment

## 2017-05-06 NOTE — Transfer of Care (Signed)
Immediate Anesthesia Transfer of Care Note  Patient: [name]  Procedure(s) Performed: REVISION PLICATION OF ARTERIOVENOUS FISTULA LEFT ARM (Left )  Patient Location: PACU  Anesthesia Type:General  Level of Consciousness: awake and patient cooperative  Airway & Oxygen Therapy: Patient Spontanous Breathing  Post-op Assessment: Report given to RN and Post -op Vital signs reviewed and stable  Post vital signs: Reviewed and stable  Last Vitals:  Vitals:   05/06/17 1003  BP: 127/88  Pulse: (!) 106  Resp: 20  Temp: 36.6 C  SpO2: 100%    Last Pain:  Vitals:   05/06/17 1003  TempSrc: Oral      Patients Stated Pain Goal: 4 (28/76/81 1572)  Complications: No apparent anesthesia complications

## 2017-05-06 NOTE — Anesthesia Postprocedure Evaluation (Signed)
Anesthesia Post Note  Patient: [name]  Procedure(s) Performed: REVISION PLICATION OF ARTERIOVENOUS FISTULA LEFT ARM (Left )     Patient location during evaluation: PACU Anesthesia Type: General Level of consciousness: awake Pain management: pain level controlled Vital Signs Assessment: post-procedure vital signs reviewed and stable Respiratory status: spontaneous breathing Cardiovascular status: stable Anesthetic complications: no    Last Vitals:  Vitals:   05/06/17 1740 05/06/17 1745  BP: 114/77   Pulse: 81 78  Resp: 17 12  Temp: (!) 36.3 C   SpO2: 99% 98%    Last Pain:  Vitals:   05/06/17 1730  TempSrc:   PainSc: 0-No pain                 Titania Gault

## 2017-05-06 NOTE — Progress Notes (Signed)
New order received for one amp of Dextrose 50 for CBG 61.

## 2017-05-06 NOTE — Progress Notes (Signed)
Patient's blood sugar 69.  Notified Dr. Marcie Bal and received verbal order to give 1/2 amp D50.  Will continue to monitor patient.

## 2017-05-06 NOTE — Op Note (Signed)
    OPERATIVE REPORT  DATE OF SURGERY: 05/06/2017  PATIENT: [name], 56 y.o. male MRN: [id number]  DOB: 05/25/1960  PRE-OPERATIVE DIAGNOSIS: Erosion and aneurysmal degeneration left arm radiocephalic AV fistula  POST-OPERATIVE DIAGNOSIS:  Same  PROCEDURE: Plication left arm radiocephalic AV fistula  SURGEON:  Curt Jews, M.D.  PHYSICIAN ASSISTANT: Matt Eveland PA-C  ANESTHESIA: General  EBL: Minimal ml  Total I/O In: 1000 [I.V.:500; IV Piggyback:500] Out: 20 [Blood:20]  BLOOD ADMINISTERED: None  DRAINS: None  SPECIMEN: None  COUNTS CORRECT:  YES  PLAN OF CARE: PACU  PATIENT DISPOSITION:  PACU - hemodynamically stable  PROCEDURE DETAILS: The patient was taken to the operating room placed in supine position where the area of the left arm was prepped and draped in usual sterile fashion.  There was an area on the mid forearm radiocephalic fistula with dilatation and.  Erosion in the skin.  An ellipse of skin was removed and taken down to the cephalic vein fistula.  The cephalic vein was ligated below the aneurysmal change and the skin was completely excised off this segment of the vein.  The vein was very thin but was not disrupted at this area.  A 5-0 Prolene suture was used to plicate the vein to narrow the aneurysmal segment.  The skin was then closed over this with a running 3-0 Vicryl suture.  A sterile dressing was applied and the patient was transferred to the recovery room in stable condition   Rosetta Posner, M.D., [facility] 05/06/2017 5:23 PM

## 2017-05-07 ENCOUNTER — Encounter (HOSPITAL_COMMUNITY): Payer: Self-pay | Admitting: Vascular Surgery

## 2017-05-09 DIAGNOSIS — D631 Anemia in chronic kidney disease: Secondary | ICD-10-CM | POA: Diagnosis not present

## 2017-05-09 DIAGNOSIS — N2581 Secondary hyperparathyroidism of renal origin: Secondary | ICD-10-CM | POA: Diagnosis not present

## 2017-05-09 DIAGNOSIS — D509 Iron deficiency anemia, unspecified: Secondary | ICD-10-CM | POA: Diagnosis not present

## 2017-05-09 DIAGNOSIS — N186 End stage renal disease: Secondary | ICD-10-CM | POA: Diagnosis not present

## 2017-05-09 DIAGNOSIS — E213 Hyperparathyroidism, unspecified: Secondary | ICD-10-CM | POA: Diagnosis not present

## 2017-05-09 DIAGNOSIS — E1129 Type 2 diabetes mellitus with other diabetic kidney complication: Secondary | ICD-10-CM | POA: Diagnosis not present

## 2017-05-09 LAB — POCT I-STAT 4, (NA,K, GLUC, HGB,HCT)
Glucose, Bld: 85 mg/dL (ref 65–99)
HCT: 44 % (ref 39.0–52.0)
Hemoglobin: 15 g/dL (ref 13.0–17.0)
Potassium: 8.3 mmol/L (ref 3.5–5.1)
Sodium: 134 mmol/L — ABNORMAL LOW (ref 135–145)

## 2017-05-11 DIAGNOSIS — E1129 Type 2 diabetes mellitus with other diabetic kidney complication: Secondary | ICD-10-CM | POA: Diagnosis not present

## 2017-05-11 DIAGNOSIS — D631 Anemia in chronic kidney disease: Secondary | ICD-10-CM | POA: Diagnosis not present

## 2017-05-11 DIAGNOSIS — N2581 Secondary hyperparathyroidism of renal origin: Secondary | ICD-10-CM | POA: Diagnosis not present

## 2017-05-11 DIAGNOSIS — N186 End stage renal disease: Secondary | ICD-10-CM | POA: Diagnosis not present

## 2017-05-11 DIAGNOSIS — E213 Hyperparathyroidism, unspecified: Secondary | ICD-10-CM | POA: Diagnosis not present

## 2017-05-11 DIAGNOSIS — D509 Iron deficiency anemia, unspecified: Secondary | ICD-10-CM | POA: Diagnosis not present

## 2017-05-13 DIAGNOSIS — N186 End stage renal disease: Secondary | ICD-10-CM | POA: Diagnosis not present

## 2017-05-13 DIAGNOSIS — D509 Iron deficiency anemia, unspecified: Secondary | ICD-10-CM | POA: Diagnosis not present

## 2017-05-13 DIAGNOSIS — N2581 Secondary hyperparathyroidism of renal origin: Secondary | ICD-10-CM | POA: Diagnosis not present

## 2017-05-13 DIAGNOSIS — E1129 Type 2 diabetes mellitus with other diabetic kidney complication: Secondary | ICD-10-CM | POA: Diagnosis not present

## 2017-05-13 DIAGNOSIS — E213 Hyperparathyroidism, unspecified: Secondary | ICD-10-CM | POA: Diagnosis not present

## 2017-05-13 DIAGNOSIS — D631 Anemia in chronic kidney disease: Secondary | ICD-10-CM | POA: Diagnosis not present

## 2017-05-15 DIAGNOSIS — E213 Hyperparathyroidism, unspecified: Secondary | ICD-10-CM | POA: Diagnosis not present

## 2017-05-15 DIAGNOSIS — D509 Iron deficiency anemia, unspecified: Secondary | ICD-10-CM | POA: Diagnosis not present

## 2017-05-15 DIAGNOSIS — N2581 Secondary hyperparathyroidism of renal origin: Secondary | ICD-10-CM | POA: Diagnosis not present

## 2017-05-15 DIAGNOSIS — D631 Anemia in chronic kidney disease: Secondary | ICD-10-CM | POA: Diagnosis not present

## 2017-05-15 DIAGNOSIS — E1129 Type 2 diabetes mellitus with other diabetic kidney complication: Secondary | ICD-10-CM | POA: Diagnosis not present

## 2017-05-15 DIAGNOSIS — N186 End stage renal disease: Secondary | ICD-10-CM | POA: Diagnosis not present

## 2017-05-18 DIAGNOSIS — D509 Iron deficiency anemia, unspecified: Secondary | ICD-10-CM | POA: Diagnosis not present

## 2017-05-18 DIAGNOSIS — E213 Hyperparathyroidism, unspecified: Secondary | ICD-10-CM | POA: Diagnosis not present

## 2017-05-18 DIAGNOSIS — N2581 Secondary hyperparathyroidism of renal origin: Secondary | ICD-10-CM | POA: Diagnosis not present

## 2017-05-18 DIAGNOSIS — N186 End stage renal disease: Secondary | ICD-10-CM | POA: Diagnosis not present

## 2017-05-18 DIAGNOSIS — E1129 Type 2 diabetes mellitus with other diabetic kidney complication: Secondary | ICD-10-CM | POA: Diagnosis not present

## 2017-05-18 DIAGNOSIS — D631 Anemia in chronic kidney disease: Secondary | ICD-10-CM | POA: Diagnosis not present

## 2017-05-18 DIAGNOSIS — I4892 Unspecified atrial flutter: Secondary | ICD-10-CM | POA: Diagnosis not present

## 2017-05-20 DIAGNOSIS — E213 Hyperparathyroidism, unspecified: Secondary | ICD-10-CM | POA: Diagnosis not present

## 2017-05-20 DIAGNOSIS — E1129 Type 2 diabetes mellitus with other diabetic kidney complication: Secondary | ICD-10-CM | POA: Diagnosis not present

## 2017-05-20 DIAGNOSIS — N186 End stage renal disease: Secondary | ICD-10-CM | POA: Diagnosis not present

## 2017-05-20 DIAGNOSIS — N2581 Secondary hyperparathyroidism of renal origin: Secondary | ICD-10-CM | POA: Diagnosis not present

## 2017-05-20 DIAGNOSIS — D631 Anemia in chronic kidney disease: Secondary | ICD-10-CM | POA: Diagnosis not present

## 2017-05-20 DIAGNOSIS — D509 Iron deficiency anemia, unspecified: Secondary | ICD-10-CM | POA: Diagnosis not present

## 2017-05-22 DIAGNOSIS — N186 End stage renal disease: Secondary | ICD-10-CM | POA: Diagnosis not present

## 2017-05-22 DIAGNOSIS — D631 Anemia in chronic kidney disease: Secondary | ICD-10-CM | POA: Diagnosis not present

## 2017-05-22 DIAGNOSIS — E213 Hyperparathyroidism, unspecified: Secondary | ICD-10-CM | POA: Diagnosis not present

## 2017-05-22 DIAGNOSIS — E1129 Type 2 diabetes mellitus with other diabetic kidney complication: Secondary | ICD-10-CM | POA: Diagnosis not present

## 2017-05-22 DIAGNOSIS — N2581 Secondary hyperparathyroidism of renal origin: Secondary | ICD-10-CM | POA: Diagnosis not present

## 2017-05-22 DIAGNOSIS — D509 Iron deficiency anemia, unspecified: Secondary | ICD-10-CM | POA: Diagnosis not present

## 2017-05-23 DIAGNOSIS — N186 End stage renal disease: Secondary | ICD-10-CM | POA: Diagnosis not present

## 2017-05-23 DIAGNOSIS — Z992 Dependence on renal dialysis: Secondary | ICD-10-CM | POA: Diagnosis not present

## 2017-05-23 DIAGNOSIS — I120 Hypertensive chronic kidney disease with stage 5 chronic kidney disease or end stage renal disease: Secondary | ICD-10-CM | POA: Diagnosis not present

## 2017-05-24 HISTORY — PX: KIDNEY CYST REMOVAL: SHX684

## 2017-05-25 DIAGNOSIS — E1129 Type 2 diabetes mellitus with other diabetic kidney complication: Secondary | ICD-10-CM | POA: Diagnosis not present

## 2017-05-25 DIAGNOSIS — N186 End stage renal disease: Secondary | ICD-10-CM | POA: Diagnosis not present

## 2017-05-25 DIAGNOSIS — N2581 Secondary hyperparathyroidism of renal origin: Secondary | ICD-10-CM | POA: Diagnosis not present

## 2017-05-25 DIAGNOSIS — D509 Iron deficiency anemia, unspecified: Secondary | ICD-10-CM | POA: Diagnosis not present

## 2017-05-25 DIAGNOSIS — D631 Anemia in chronic kidney disease: Secondary | ICD-10-CM | POA: Diagnosis not present

## 2017-05-27 DIAGNOSIS — D509 Iron deficiency anemia, unspecified: Secondary | ICD-10-CM | POA: Diagnosis not present

## 2017-05-27 DIAGNOSIS — E1129 Type 2 diabetes mellitus with other diabetic kidney complication: Secondary | ICD-10-CM | POA: Diagnosis not present

## 2017-05-27 DIAGNOSIS — N2581 Secondary hyperparathyroidism of renal origin: Secondary | ICD-10-CM | POA: Diagnosis not present

## 2017-05-27 DIAGNOSIS — N186 End stage renal disease: Secondary | ICD-10-CM | POA: Diagnosis not present

## 2017-05-27 DIAGNOSIS — D631 Anemia in chronic kidney disease: Secondary | ICD-10-CM | POA: Diagnosis not present

## 2017-05-30 DIAGNOSIS — D509 Iron deficiency anemia, unspecified: Secondary | ICD-10-CM | POA: Diagnosis not present

## 2017-05-30 DIAGNOSIS — E1129 Type 2 diabetes mellitus with other diabetic kidney complication: Secondary | ICD-10-CM | POA: Diagnosis not present

## 2017-05-30 DIAGNOSIS — N2581 Secondary hyperparathyroidism of renal origin: Secondary | ICD-10-CM | POA: Diagnosis not present

## 2017-05-30 DIAGNOSIS — N186 End stage renal disease: Secondary | ICD-10-CM | POA: Diagnosis not present

## 2017-05-30 DIAGNOSIS — D631 Anemia in chronic kidney disease: Secondary | ICD-10-CM | POA: Diagnosis not present

## 2017-06-01 DIAGNOSIS — D631 Anemia in chronic kidney disease: Secondary | ICD-10-CM | POA: Diagnosis not present

## 2017-06-01 DIAGNOSIS — N186 End stage renal disease: Secondary | ICD-10-CM | POA: Diagnosis not present

## 2017-06-01 DIAGNOSIS — E1129 Type 2 diabetes mellitus with other diabetic kidney complication: Secondary | ICD-10-CM | POA: Diagnosis not present

## 2017-06-01 DIAGNOSIS — D509 Iron deficiency anemia, unspecified: Secondary | ICD-10-CM | POA: Diagnosis not present

## 2017-06-01 DIAGNOSIS — N2581 Secondary hyperparathyroidism of renal origin: Secondary | ICD-10-CM | POA: Diagnosis not present

## 2017-06-03 DIAGNOSIS — N186 End stage renal disease: Secondary | ICD-10-CM | POA: Diagnosis not present

## 2017-06-03 DIAGNOSIS — D509 Iron deficiency anemia, unspecified: Secondary | ICD-10-CM | POA: Diagnosis not present

## 2017-06-03 DIAGNOSIS — N2581 Secondary hyperparathyroidism of renal origin: Secondary | ICD-10-CM | POA: Diagnosis not present

## 2017-06-03 DIAGNOSIS — D631 Anemia in chronic kidney disease: Secondary | ICD-10-CM | POA: Diagnosis not present

## 2017-06-03 DIAGNOSIS — E1129 Type 2 diabetes mellitus with other diabetic kidney complication: Secondary | ICD-10-CM | POA: Diagnosis not present

## 2017-06-06 DIAGNOSIS — D631 Anemia in chronic kidney disease: Secondary | ICD-10-CM | POA: Diagnosis not present

## 2017-06-06 DIAGNOSIS — D509 Iron deficiency anemia, unspecified: Secondary | ICD-10-CM | POA: Diagnosis not present

## 2017-06-06 DIAGNOSIS — N2581 Secondary hyperparathyroidism of renal origin: Secondary | ICD-10-CM | POA: Diagnosis not present

## 2017-06-06 DIAGNOSIS — N186 End stage renal disease: Secondary | ICD-10-CM | POA: Diagnosis not present

## 2017-06-06 DIAGNOSIS — E1129 Type 2 diabetes mellitus with other diabetic kidney complication: Secondary | ICD-10-CM | POA: Diagnosis not present

## 2017-06-08 DIAGNOSIS — N2581 Secondary hyperparathyroidism of renal origin: Secondary | ICD-10-CM | POA: Diagnosis not present

## 2017-06-08 DIAGNOSIS — N186 End stage renal disease: Secondary | ICD-10-CM | POA: Diagnosis not present

## 2017-06-08 DIAGNOSIS — D631 Anemia in chronic kidney disease: Secondary | ICD-10-CM | POA: Diagnosis not present

## 2017-06-08 DIAGNOSIS — E1129 Type 2 diabetes mellitus with other diabetic kidney complication: Secondary | ICD-10-CM | POA: Diagnosis not present

## 2017-06-08 DIAGNOSIS — D509 Iron deficiency anemia, unspecified: Secondary | ICD-10-CM | POA: Diagnosis not present

## 2017-06-10 DIAGNOSIS — N2581 Secondary hyperparathyroidism of renal origin: Secondary | ICD-10-CM | POA: Diagnosis not present

## 2017-06-10 DIAGNOSIS — D631 Anemia in chronic kidney disease: Secondary | ICD-10-CM | POA: Diagnosis not present

## 2017-06-10 DIAGNOSIS — D509 Iron deficiency anemia, unspecified: Secondary | ICD-10-CM | POA: Diagnosis not present

## 2017-06-10 DIAGNOSIS — E1129 Type 2 diabetes mellitus with other diabetic kidney complication: Secondary | ICD-10-CM | POA: Diagnosis not present

## 2017-06-10 DIAGNOSIS — N186 End stage renal disease: Secondary | ICD-10-CM | POA: Diagnosis not present

## 2017-06-13 DIAGNOSIS — N2581 Secondary hyperparathyroidism of renal origin: Secondary | ICD-10-CM | POA: Diagnosis not present

## 2017-06-13 DIAGNOSIS — N186 End stage renal disease: Secondary | ICD-10-CM | POA: Diagnosis not present

## 2017-06-13 DIAGNOSIS — D509 Iron deficiency anemia, unspecified: Secondary | ICD-10-CM | POA: Diagnosis not present

## 2017-06-13 DIAGNOSIS — D631 Anemia in chronic kidney disease: Secondary | ICD-10-CM | POA: Diagnosis not present

## 2017-06-13 DIAGNOSIS — E1129 Type 2 diabetes mellitus with other diabetic kidney complication: Secondary | ICD-10-CM | POA: Diagnosis not present

## 2017-06-15 DIAGNOSIS — D509 Iron deficiency anemia, unspecified: Secondary | ICD-10-CM | POA: Diagnosis not present

## 2017-06-15 DIAGNOSIS — I4892 Unspecified atrial flutter: Secondary | ICD-10-CM | POA: Diagnosis not present

## 2017-06-15 DIAGNOSIS — E1129 Type 2 diabetes mellitus with other diabetic kidney complication: Secondary | ICD-10-CM | POA: Diagnosis not present

## 2017-06-15 DIAGNOSIS — N2581 Secondary hyperparathyroidism of renal origin: Secondary | ICD-10-CM | POA: Diagnosis not present

## 2017-06-15 DIAGNOSIS — N186 End stage renal disease: Secondary | ICD-10-CM | POA: Diagnosis not present

## 2017-06-15 DIAGNOSIS — D631 Anemia in chronic kidney disease: Secondary | ICD-10-CM | POA: Diagnosis not present

## 2017-06-17 DIAGNOSIS — N2581 Secondary hyperparathyroidism of renal origin: Secondary | ICD-10-CM | POA: Diagnosis not present

## 2017-06-17 DIAGNOSIS — D509 Iron deficiency anemia, unspecified: Secondary | ICD-10-CM | POA: Diagnosis not present

## 2017-06-17 DIAGNOSIS — E1129 Type 2 diabetes mellitus with other diabetic kidney complication: Secondary | ICD-10-CM | POA: Diagnosis not present

## 2017-06-17 DIAGNOSIS — D631 Anemia in chronic kidney disease: Secondary | ICD-10-CM | POA: Diagnosis not present

## 2017-06-17 DIAGNOSIS — N186 End stage renal disease: Secondary | ICD-10-CM | POA: Diagnosis not present

## 2017-06-20 DIAGNOSIS — N186 End stage renal disease: Secondary | ICD-10-CM | POA: Diagnosis not present

## 2017-06-20 DIAGNOSIS — D631 Anemia in chronic kidney disease: Secondary | ICD-10-CM | POA: Diagnosis not present

## 2017-06-20 DIAGNOSIS — D509 Iron deficiency anemia, unspecified: Secondary | ICD-10-CM | POA: Diagnosis not present

## 2017-06-20 DIAGNOSIS — N2581 Secondary hyperparathyroidism of renal origin: Secondary | ICD-10-CM | POA: Diagnosis not present

## 2017-06-20 DIAGNOSIS — E1129 Type 2 diabetes mellitus with other diabetic kidney complication: Secondary | ICD-10-CM | POA: Diagnosis not present

## 2017-06-22 DIAGNOSIS — E1129 Type 2 diabetes mellitus with other diabetic kidney complication: Secondary | ICD-10-CM | POA: Diagnosis not present

## 2017-06-22 DIAGNOSIS — D509 Iron deficiency anemia, unspecified: Secondary | ICD-10-CM | POA: Diagnosis not present

## 2017-06-22 DIAGNOSIS — N186 End stage renal disease: Secondary | ICD-10-CM | POA: Diagnosis not present

## 2017-06-22 DIAGNOSIS — D631 Anemia in chronic kidney disease: Secondary | ICD-10-CM | POA: Diagnosis not present

## 2017-06-22 DIAGNOSIS — N2581 Secondary hyperparathyroidism of renal origin: Secondary | ICD-10-CM | POA: Diagnosis not present

## 2017-06-23 DIAGNOSIS — Z992 Dependence on renal dialysis: Secondary | ICD-10-CM | POA: Diagnosis not present

## 2017-06-23 DIAGNOSIS — N186 End stage renal disease: Secondary | ICD-10-CM | POA: Diagnosis not present

## 2017-06-23 DIAGNOSIS — I120 Hypertensive chronic kidney disease with stage 5 chronic kidney disease or end stage renal disease: Secondary | ICD-10-CM | POA: Diagnosis not present

## 2017-06-24 DIAGNOSIS — D509 Iron deficiency anemia, unspecified: Secondary | ICD-10-CM | POA: Diagnosis not present

## 2017-06-24 DIAGNOSIS — I120 Hypertensive chronic kidney disease with stage 5 chronic kidney disease or end stage renal disease: Secondary | ICD-10-CM | POA: Diagnosis not present

## 2017-06-24 DIAGNOSIS — N186 End stage renal disease: Secondary | ICD-10-CM | POA: Diagnosis not present

## 2017-06-24 DIAGNOSIS — N2581 Secondary hyperparathyroidism of renal origin: Secondary | ICD-10-CM | POA: Diagnosis not present

## 2017-06-24 DIAGNOSIS — D631 Anemia in chronic kidney disease: Secondary | ICD-10-CM | POA: Diagnosis not present

## 2017-06-24 DIAGNOSIS — Z992 Dependence on renal dialysis: Secondary | ICD-10-CM | POA: Diagnosis not present

## 2017-06-27 DIAGNOSIS — N186 End stage renal disease: Secondary | ICD-10-CM | POA: Diagnosis not present

## 2017-06-27 DIAGNOSIS — N2581 Secondary hyperparathyroidism of renal origin: Secondary | ICD-10-CM | POA: Diagnosis not present

## 2017-06-27 DIAGNOSIS — D631 Anemia in chronic kidney disease: Secondary | ICD-10-CM | POA: Diagnosis not present

## 2017-06-27 DIAGNOSIS — D509 Iron deficiency anemia, unspecified: Secondary | ICD-10-CM | POA: Diagnosis not present

## 2017-06-29 DIAGNOSIS — D631 Anemia in chronic kidney disease: Secondary | ICD-10-CM | POA: Diagnosis not present

## 2017-06-29 DIAGNOSIS — N186 End stage renal disease: Secondary | ICD-10-CM | POA: Diagnosis not present

## 2017-06-29 DIAGNOSIS — N2581 Secondary hyperparathyroidism of renal origin: Secondary | ICD-10-CM | POA: Diagnosis not present

## 2017-06-29 DIAGNOSIS — D509 Iron deficiency anemia, unspecified: Secondary | ICD-10-CM | POA: Diagnosis not present

## 2017-07-01 DIAGNOSIS — N186 End stage renal disease: Secondary | ICD-10-CM | POA: Diagnosis not present

## 2017-07-01 DIAGNOSIS — D509 Iron deficiency anemia, unspecified: Secondary | ICD-10-CM | POA: Diagnosis not present

## 2017-07-01 DIAGNOSIS — N2581 Secondary hyperparathyroidism of renal origin: Secondary | ICD-10-CM | POA: Diagnosis not present

## 2017-07-01 DIAGNOSIS — D631 Anemia in chronic kidney disease: Secondary | ICD-10-CM | POA: Diagnosis not present

## 2017-07-04 DIAGNOSIS — N186 End stage renal disease: Secondary | ICD-10-CM | POA: Diagnosis not present

## 2017-07-04 DIAGNOSIS — D509 Iron deficiency anemia, unspecified: Secondary | ICD-10-CM | POA: Diagnosis not present

## 2017-07-04 DIAGNOSIS — N2581 Secondary hyperparathyroidism of renal origin: Secondary | ICD-10-CM | POA: Diagnosis not present

## 2017-07-04 DIAGNOSIS — D631 Anemia in chronic kidney disease: Secondary | ICD-10-CM | POA: Diagnosis not present

## 2017-07-05 DIAGNOSIS — N186 End stage renal disease: Secondary | ICD-10-CM | POA: Diagnosis not present

## 2017-07-05 DIAGNOSIS — I5022 Chronic systolic (congestive) heart failure: Secondary | ICD-10-CM | POA: Diagnosis not present

## 2017-07-05 DIAGNOSIS — I2510 Atherosclerotic heart disease of native coronary artery without angina pectoris: Secondary | ICD-10-CM | POA: Diagnosis not present

## 2017-07-05 DIAGNOSIS — I120 Hypertensive chronic kidney disease with stage 5 chronic kidney disease or end stage renal disease: Secondary | ICD-10-CM | POA: Diagnosis not present

## 2017-07-05 DIAGNOSIS — E6609 Other obesity due to excess calories: Secondary | ICD-10-CM | POA: Diagnosis not present

## 2017-07-06 DIAGNOSIS — N2581 Secondary hyperparathyroidism of renal origin: Secondary | ICD-10-CM | POA: Diagnosis not present

## 2017-07-06 DIAGNOSIS — D509 Iron deficiency anemia, unspecified: Secondary | ICD-10-CM | POA: Diagnosis not present

## 2017-07-06 DIAGNOSIS — N186 End stage renal disease: Secondary | ICD-10-CM | POA: Diagnosis not present

## 2017-07-06 DIAGNOSIS — D631 Anemia in chronic kidney disease: Secondary | ICD-10-CM | POA: Diagnosis not present

## 2017-07-08 DIAGNOSIS — D509 Iron deficiency anemia, unspecified: Secondary | ICD-10-CM | POA: Diagnosis not present

## 2017-07-08 DIAGNOSIS — N186 End stage renal disease: Secondary | ICD-10-CM | POA: Diagnosis not present

## 2017-07-08 DIAGNOSIS — D631 Anemia in chronic kidney disease: Secondary | ICD-10-CM | POA: Diagnosis not present

## 2017-07-08 DIAGNOSIS — N2581 Secondary hyperparathyroidism of renal origin: Secondary | ICD-10-CM | POA: Diagnosis not present

## 2017-07-11 DIAGNOSIS — N2581 Secondary hyperparathyroidism of renal origin: Secondary | ICD-10-CM | POA: Diagnosis not present

## 2017-07-11 DIAGNOSIS — D631 Anemia in chronic kidney disease: Secondary | ICD-10-CM | POA: Diagnosis not present

## 2017-07-11 DIAGNOSIS — D509 Iron deficiency anemia, unspecified: Secondary | ICD-10-CM | POA: Diagnosis not present

## 2017-07-11 DIAGNOSIS — N186 End stage renal disease: Secondary | ICD-10-CM | POA: Diagnosis not present

## 2017-07-13 DIAGNOSIS — D631 Anemia in chronic kidney disease: Secondary | ICD-10-CM | POA: Diagnosis not present

## 2017-07-13 DIAGNOSIS — D509 Iron deficiency anemia, unspecified: Secondary | ICD-10-CM | POA: Diagnosis not present

## 2017-07-13 DIAGNOSIS — N186 End stage renal disease: Secondary | ICD-10-CM | POA: Diagnosis not present

## 2017-07-13 DIAGNOSIS — N2581 Secondary hyperparathyroidism of renal origin: Secondary | ICD-10-CM | POA: Diagnosis not present

## 2017-07-15 DIAGNOSIS — N2581 Secondary hyperparathyroidism of renal origin: Secondary | ICD-10-CM | POA: Diagnosis not present

## 2017-07-15 DIAGNOSIS — D631 Anemia in chronic kidney disease: Secondary | ICD-10-CM | POA: Diagnosis not present

## 2017-07-15 DIAGNOSIS — N186 End stage renal disease: Secondary | ICD-10-CM | POA: Diagnosis not present

## 2017-07-15 DIAGNOSIS — Z992 Dependence on renal dialysis: Secondary | ICD-10-CM | POA: Insufficient documentation

## 2017-07-15 DIAGNOSIS — D509 Iron deficiency anemia, unspecified: Secondary | ICD-10-CM | POA: Diagnosis not present

## 2017-07-17 ENCOUNTER — Encounter (HOSPITAL_COMMUNITY): Payer: Self-pay | Admitting: Emergency Medicine

## 2017-07-17 ENCOUNTER — Other Ambulatory Visit: Payer: Self-pay

## 2017-07-17 ENCOUNTER — Inpatient Hospital Stay (HOSPITAL_COMMUNITY)
Admission: EM | Admit: 2017-07-17 | Discharge: 2017-07-21 | DRG: 291 | Disposition: A | Discharge status: Home / Self Care | Payer: Medicare Other | Attending: Internal Medicine | Admitting: Internal Medicine

## 2017-07-17 ENCOUNTER — Emergency Department (HOSPITAL_COMMUNITY): Payer: Medicare Other

## 2017-07-17 DIAGNOSIS — E8889 Other specified metabolic disorders: Secondary | ICD-10-CM | POA: Diagnosis present

## 2017-07-17 DIAGNOSIS — D649 Anemia, unspecified: Secondary | ICD-10-CM | POA: Diagnosis present

## 2017-07-17 DIAGNOSIS — H5461 Unqualified visual loss, right eye, normal vision left eye: Secondary | ICD-10-CM | POA: Diagnosis present

## 2017-07-17 DIAGNOSIS — I429 Cardiomyopathy, unspecified: Secondary | ICD-10-CM | POA: Diagnosis present

## 2017-07-17 DIAGNOSIS — J40 Bronchitis, not specified as acute or chronic: Secondary | ICD-10-CM | POA: Diagnosis not present

## 2017-07-17 DIAGNOSIS — I509 Heart failure, unspecified: Secondary | ICD-10-CM | POA: Diagnosis not present

## 2017-07-17 DIAGNOSIS — N2581 Secondary hyperparathyroidism of renal origin: Secondary | ICD-10-CM | POA: Diagnosis not present

## 2017-07-17 DIAGNOSIS — R05 Cough: Secondary | ICD-10-CM | POA: Diagnosis not present

## 2017-07-17 DIAGNOSIS — G894 Chronic pain syndrome: Secondary | ICD-10-CM | POA: Diagnosis present

## 2017-07-17 DIAGNOSIS — Z8673 Personal history of transient ischemic attack (TIA), and cerebral infarction without residual deficits: Secondary | ICD-10-CM

## 2017-07-17 DIAGNOSIS — Z8249 Family history of ischemic heart disease and other diseases of the circulatory system: Secondary | ICD-10-CM | POA: Diagnosis not present

## 2017-07-17 DIAGNOSIS — Z7901 Long term (current) use of anticoagulants: Secondary | ICD-10-CM

## 2017-07-17 DIAGNOSIS — I2510 Atherosclerotic heart disease of native coronary artery without angina pectoris: Secondary | ICD-10-CM | POA: Diagnosis present

## 2017-07-17 DIAGNOSIS — I132 Hypertensive heart and chronic kidney disease with heart failure and with stage 5 chronic kidney disease, or end stage renal disease: Secondary | ICD-10-CM | POA: Diagnosis not present

## 2017-07-17 DIAGNOSIS — J9601 Acute respiratory failure with hypoxia: Secondary | ICD-10-CM | POA: Diagnosis not present

## 2017-07-17 DIAGNOSIS — E1122 Type 2 diabetes mellitus with diabetic chronic kidney disease: Secondary | ICD-10-CM | POA: Diagnosis present

## 2017-07-17 DIAGNOSIS — K219 Gastro-esophageal reflux disease without esophagitis: Secondary | ICD-10-CM | POA: Diagnosis present

## 2017-07-17 DIAGNOSIS — Z6833 Body mass index (BMI) 33.0-33.9, adult: Secondary | ICD-10-CM

## 2017-07-17 DIAGNOSIS — H5440 Blindness, one eye, unspecified eye: Secondary | ICD-10-CM

## 2017-07-17 DIAGNOSIS — Z905 Acquired absence of kidney: Secondary | ICD-10-CM

## 2017-07-17 DIAGNOSIS — Z85528 Personal history of other malignant neoplasm of kidney: Secondary | ICD-10-CM

## 2017-07-17 DIAGNOSIS — I252 Old myocardial infarction: Secondary | ICD-10-CM | POA: Diagnosis not present

## 2017-07-17 DIAGNOSIS — I480 Paroxysmal atrial fibrillation: Secondary | ICD-10-CM | POA: Diagnosis present

## 2017-07-17 DIAGNOSIS — N186 End stage renal disease: Secondary | ICD-10-CM | POA: Diagnosis present

## 2017-07-17 DIAGNOSIS — I953 Hypotension of hemodialysis: Secondary | ICD-10-CM | POA: Diagnosis not present

## 2017-07-17 DIAGNOSIS — E669 Obesity, unspecified: Secondary | ICD-10-CM | POA: Diagnosis present

## 2017-07-17 DIAGNOSIS — R0602 Shortness of breath: Secondary | ICD-10-CM | POA: Diagnosis not present

## 2017-07-17 DIAGNOSIS — I959 Hypotension, unspecified: Secondary | ICD-10-CM | POA: Diagnosis not present

## 2017-07-17 DIAGNOSIS — D631 Anemia in chronic kidney disease: Secondary | ICD-10-CM | POA: Diagnosis not present

## 2017-07-17 DIAGNOSIS — Z992 Dependence on renal dialysis: Secondary | ICD-10-CM

## 2017-07-17 DIAGNOSIS — I4892 Unspecified atrial flutter: Secondary | ICD-10-CM

## 2017-07-17 DIAGNOSIS — E119 Type 2 diabetes mellitus without complications: Secondary | ICD-10-CM

## 2017-07-17 DIAGNOSIS — I5022 Chronic systolic (congestive) heart failure: Secondary | ICD-10-CM

## 2017-07-17 DIAGNOSIS — I5043 Acute on chronic combined systolic (congestive) and diastolic (congestive) heart failure: Secondary | ICD-10-CM | POA: Diagnosis not present

## 2017-07-17 DIAGNOSIS — R0902 Hypoxemia: Secondary | ICD-10-CM

## 2017-07-17 LAB — BASIC METABOLIC PANEL
Anion gap: 15 (ref 5–15)
BUN: 32 mg/dL — ABNORMAL HIGH (ref 6–20)
CO2: 23 mmol/L (ref 22–32)
Calcium: 9.4 mg/dL (ref 8.9–10.3)
Chloride: 96 mmol/L — ABNORMAL LOW (ref 101–111)
Creatinine, Ser: 11.44 mg/dL — ABNORMAL HIGH (ref 0.61–1.24)
GFR calc Af Amer: 5 mL/min — ABNORMAL LOW (ref >60)
GFR calc non Af Amer: 4 mL/min — ABNORMAL LOW (ref >60)
Glucose, Bld: 84 mg/dL (ref 65–99)
Potassium: 5.4 mmol/L — ABNORMAL HIGH (ref 3.5–5.1)
Sodium: 134 mmol/L — ABNORMAL LOW (ref 135–145)

## 2017-07-17 LAB — I-STAT TROPONIN, ED
Troponin i, poc: 0.09 ng/mL (ref 0.00–0.08)
Troponin i, poc: 0.07 ng/mL (ref 0.00–0.08)

## 2017-07-17 LAB — CBC
HCT: 37.4 % — ABNORMAL LOW (ref 39.0–52.0)
Hemoglobin: 11.8 g/dL — ABNORMAL LOW (ref 13.0–17.0)
MCH: 30.1 pg (ref 26.0–34.0)
MCHC: 31.6 g/dL (ref 30.0–36.0)
MCV: 95.4 fL (ref 78.0–100.0)
Platelets: 220 10*3/uL (ref 150–400)
RBC: 3.92 MIL/uL — ABNORMAL LOW (ref 4.22–5.81)
RDW: 15.2 % (ref 11.5–15.5)
WBC: 5 10*3/uL (ref 4.0–10.5)

## 2017-07-17 LAB — PROTIME-INR
INR: 2.72
Prothrombin Time: 28.7 seconds — ABNORMAL HIGH (ref 11.4–15.2)

## 2017-07-17 LAB — INFLUENZA PANEL BY PCR (TYPE A & B)
Influenza A By PCR: NEGATIVE
Influenza B By PCR: NEGATIVE

## 2017-07-17 MED ORDER — SODIUM CHLORIDE 0.9 % IV SOLN: 250.0000 mL | INTRAVENOUS | Status: DC | PRN

## 2017-07-17 MED ORDER — ALLOPURINOL 300 MG PO TABS
150.0000 mg | ORAL_TABLET | ORAL | Status: DC
Start: 2017-07-18 — End: 2017-07-21
  Administered 2017-07-18 – 2017-07-20 (×2): 150 mg via ORAL
  Filled 2017-07-17 (×2): qty 1

## 2017-07-17 MED ORDER — OXYCODONE HCL 5 MG PO TABS: 5.0000 mg | ORAL_TABLET | ORAL | Status: DC | PRN

## 2017-07-17 MED ORDER — MIDODRINE HCL 5 MG PO TABS: 5.0000 mg | ORAL_TABLET | ORAL | Status: DC

## 2017-07-17 MED ORDER — PANTOPRAZOLE SODIUM 40 MG PO TBEC
40.0000 mg | DELAYED_RELEASE_TABLET | Freq: Two times a day (BID) | ORAL | Status: DC
  Administered 2017-07-17 – 2017-07-21 (×7): 40 mg via ORAL
  Filled 2017-07-17 (×8): qty 1

## 2017-07-17 MED ORDER — AMIODARONE HCL 100 MG PO TABS
100.0000 mg | ORAL_TABLET | Freq: Every day | ORAL | Status: DC
  Administered 2017-07-17 – 2017-07-21 (×4): 100 mg via ORAL
  Filled 2017-07-17 (×4): qty 1

## 2017-07-17 MED ORDER — ADULT MULTIVITAMIN W/MINERALS CH
1.0000 | ORAL_TABLET | Freq: Every day | ORAL | Status: DC
  Administered 2017-07-17 – 2017-07-21 (×5): 1 via ORAL
  Filled 2017-07-17 (×5): qty 1

## 2017-07-17 MED ORDER — FERRIC CITRATE 1 GM 210 MG(FE) PO TABS
210.0000 mg | ORAL_TABLET | Freq: Three times a day (TID) | ORAL | Status: DC
  Administered 2017-07-18 – 2017-07-20 (×5): 210 mg via ORAL
  Filled 2017-07-17 (×8): qty 1

## 2017-07-17 MED ORDER — MIDODRINE HCL 5 MG PO TABS: 10.0000 mg | ORAL_TABLET | ORAL | Status: DC

## 2017-07-17 MED ORDER — SODIUM CHLORIDE 0.9 % IV SOLN
62.5000 mg | INTRAVENOUS | Status: DC
  Administered 2017-07-20: 62.5 mg via INTRAVENOUS
  Filled 2017-07-17 (×2): qty 5

## 2017-07-17 MED ORDER — SODIUM CHLORIDE 0.9% FLUSH
3.0000 mL | Freq: Two times a day (BID) | INTRAVENOUS | Status: DC
  Administered 2017-07-17 – 2017-07-20 (×4): 3 mL via INTRAVENOUS

## 2017-07-17 MED ORDER — LIDOCAINE-PRILOCAINE 2.5-2.5 % EX CREA
1.0000 "application " | TOPICAL_CREAM | CUTANEOUS | Status: DC
  Filled 2017-07-17: qty 5

## 2017-07-17 MED ORDER — ONDANSETRON HCL 4 MG/2ML IJ SOLN
4.0000 mg | Freq: Four times a day (QID) | INTRAMUSCULAR | Status: DC | PRN
  Administered 2017-07-17: 4 mg via INTRAVENOUS
  Filled 2017-07-17: qty 2

## 2017-07-17 MED ORDER — WARFARIN - PHYSICIAN DOSING INPATIENT: Freq: Every day | Status: DC

## 2017-07-17 MED ORDER — MIDODRINE HCL 5 MG PO TABS
5.0000 mg | ORAL_TABLET | ORAL | Status: DC
  Administered 2017-07-18 – 2017-07-20 (×3): 5 mg via ORAL
  Filled 2017-07-17 (×2): qty 1

## 2017-07-17 MED ORDER — ZOLPIDEM TARTRATE 5 MG PO TABS
5.0000 mg | ORAL_TABLET | Freq: Once | ORAL | Status: AC
  Administered 2017-07-17: 5 mg via ORAL
  Filled 2017-07-17: qty 1

## 2017-07-17 MED ORDER — SODIUM CHLORIDE 0.9 % IV SOLN
1.0000 g | Freq: Once | INTRAVENOUS | Status: AC
  Administered 2017-07-17: 1 g via INTRAVENOUS
  Filled 2017-07-17: qty 10

## 2017-07-17 MED ORDER — ACETAMINOPHEN 650 MG RE SUPP: 650.0000 mg | Freq: Four times a day (QID) | RECTAL | Status: DC | PRN

## 2017-07-17 MED ORDER — ACETAMINOPHEN 325 MG PO TABS: 650.0000 mg | ORAL_TABLET | Freq: Four times a day (QID) | ORAL | Status: DC | PRN

## 2017-07-17 MED ORDER — CALCIUM ACETATE (PHOS BINDER) 667 MG PO CAPS: 2001.0000 mg | ORAL_CAPSULE | Freq: Three times a day (TID) | ORAL | Status: DC

## 2017-07-17 MED ORDER — WARFARIN SODIUM 7.5 MG PO TABS
7.5000 mg | ORAL_TABLET | Freq: Every day | ORAL | Status: DC
  Administered 2017-07-17: 7.5 mg via ORAL
  Filled 2017-07-17 (×3): qty 1

## 2017-07-17 MED ORDER — NEPRO/CARBSTEADY PO LIQD
240.0000 mL | Freq: Two times a day (BID) | ORAL | Status: DC
  Administered 2017-07-18: 237 mL via ORAL
  Administered 2017-07-18 – 2017-07-21 (×5): 240 mL via ORAL
  Filled 2017-07-17 (×10): qty 474

## 2017-07-17 MED ORDER — FERRIC CITRATE 1 GM 210 MG(FE) PO TABS
420.0000 mg | ORAL_TABLET | Freq: Three times a day (TID) | ORAL | Status: DC
Start: 2017-07-17 — End: 2017-07-17
  Filled 2017-07-17: qty 2

## 2017-07-17 MED ORDER — SODIUM CHLORIDE 0.9% FLUSH: 3.0000 mL | Freq: Two times a day (BID) | INTRAVENOUS | Status: DC

## 2017-07-17 MED ORDER — DOXERCALCIFEROL 4 MCG/2ML IV SOLN
4.0000 ug | INTRAVENOUS | Status: DC
  Administered 2017-07-18 – 2017-07-20 (×2): 4 ug via INTRAVENOUS
  Filled 2017-07-17 (×2): qty 2

## 2017-07-17 MED ORDER — AZITHROMYCIN 250 MG PO TABS
500.0000 mg | ORAL_TABLET | Freq: Once | ORAL | Status: AC
  Administered 2017-07-17: 500 mg via ORAL
  Filled 2017-07-17: qty 2

## 2017-07-17 MED ORDER — SODIUM CHLORIDE 0.9% FLUSH: 3.0000 mL | INTRAVENOUS | Status: DC | PRN

## 2017-07-17 MED ORDER — POLYETHYLENE GLYCOL 3350 17 G PO PACK: 17.0000 g | PACK | Freq: Every day | ORAL | Status: DC | PRN

## 2017-07-17 MED ORDER — NITROGLYCERIN 0.4 MG SL SUBL: 0.4000 mg | SUBLINGUAL_TABLET | SUBLINGUAL | Status: DC | PRN

## 2017-07-17 NOTE — ED Provider Notes (Signed)
[facility] EMERGENCY DEPARTMENT Provider Note   CSN: [id number] Arrival date & time: 07/17/17  0703     History   Chief Complaint Chief Complaint  Patient presents with  . Chest Pain  . Cough    HPI [name] is a 57 y.o. male.  Pt presents to the ED today with sob and cough and cp.  Pt said sx have been going on for about 1 week.  He thought it was from excess fluid, but dialysis has not helped it.  He goes to dialysis on MWF and has not skipped any sessions.  The pt denies any fever.  He did not get his flu shot.  No known flu exposures.  He has had some diarrhea.        Past Medical History:  Diagnosis Date  . Atrial flutter ([place])   . 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 Cardiomyopathy   . CHF (congestive heart failure) ([place])   . Coronary artery disease    normal coronaries by 10/10/08 cath, Cardiac Cath 08-04-12 epic.Dr. Doylene Canard follows  . Diabetes mellitus    pt. states he's borderline diabetic., no longer taking med,- off med. since 2013  . Dialysis patient [facility])    Mon-Wed-Fri([place])- Left AV fistula  . Diverticulitis November 2016   reoccurred in December 2016  . ESRD (end stage renal disease) ([place])   . GERD (gastroesophageal reflux disease)   . Gout   . History of nephrectomy 07/04/2012   History of right nephrectomy in 2002 for renal cell carcinoma   . History of unilateral nephrectomy   . Hypertension   . Low iron   . Myocardial infarction [facility]) ?2006  . Renal cell carcinoma    dialysis- MWF- Dr. Mercy Moore follows.  . Renal insufficiency   . Shortness of breath 05/19/11   "at rest, lying down, w/exertion"  . Stroke [facility]) 02/2011   05/19/11 denies residual  . Umbilical hernia 28/36/62   unrepaired    Patient Active Problem List   Diagnosis Date Noted  . Unresponsiveness 03/16/2017  . Hypotension 03/16/2017  . Atrial flutter ([place]) 03/16/2017  . Chronic pain 03/16/2017  . Leg pain,  bilateral 03/16/2017  . Bile leak, postoperative 03/15/2016  . Elevated troponin 03/15/2016  . Rib pain on right side   . Biliary dyskinesia 02/12/2016  . Atrial flutter with rapid ventricular response ([place]) 06/27/2015  . Diverticulitis 04/21/2015  . Acute diverticulitis 04/21/2015  . Diverticulitis of intestine without perforation or abscess without bleeding 04/21/2015  . Pulmonary edema 01/13/2015  . Right upper quadrant abdominal pain 01/13/2015  . Acute respiratory failure with hypoxia ([place]) 01/13/2015  . Diverticulitis large intestine w/o perforation or abscess w/o bleeding 12/18/2014  . Hepatic cyst 12/18/2014  . Aftercare following surgery of the circulatory system, [place] 06/22/2013  . ESRD (end stage renal disease) on dialysis ([place]) 11/28/2012  . Right sided weakness 10/05/2012  . History of nephrectomy 07/04/2012  . End-stage renal disease on hemodialysis ([place]) 06/04/2011  . Systolic CHF, chronic ([place]) 05/19/2011    Class: Acute  . Hypertension, benign 05/19/2011    Class: Chronic  . DM II (diabetes mellitus, type II), controlled ([place]) 05/19/2011    Class: Chronic  . Gout, chronic 05/19/2011    Past Surgical History:  Procedure Laterality Date  . AV FISTULA PLACEMENT  03/29/2011   Procedure: ARTERIOVENOUS (AV) FISTULA CREATION;  Surgeon: Hinda Lenis, MD;  Location: [facility]  OR;  Service: Vascular;  Laterality: Left;  LEFT RADIOCEPHALIC , Arteriovenous [id number])  . CARDIAC CATHETERIZATION  05/21/11  . CARDIAC CATHETERIZATION N/A 03/16/2016   Procedure: Left Heart Cath and Coronary Angiography;  Surgeon: Dixie Dials, MD;  Location: [facility] CV LAB;  Service: Cardiovascular;  Laterality: N/A;  . CHOLECYSTECTOMY N/A 02/12/2016   Procedure: LAPAROSCOPIC CHOLECYSTECTOMY WITH INTRAOPERATIVE CHOLANGIOGRAM;  Surgeon: Jackolyn Confer, MD;  Location: [facility];  Service: General;  Laterality: N/A;  . COLONOSCOPY WITH PROPOFOL N/A 02/01/2017   Procedure: COLONOSCOPY WITH  PROPOFOL;  Surgeon: Juanita Craver, MD;  Location: [facility] ENDOSCOPY;  Service: Endoscopy;  Laterality: N/A;  . dialysis cath placed    . ESOPHAGOGASTRODUODENOSCOPY (EGD) WITH PROPOFOL N/A 12/02/2015   Procedure: ESOPHAGOGASTRODUODENOSCOPY (EGD) WITH PROPOFOL;  Surgeon: Juanita Craver, MD;  Location: [facility] ENDOSCOPY;  Service: Endoscopy;  Laterality: N/A;  . FINGER SURGERY     L pinkie finger- ORIF- /w remaining hardware - 1990's    . HERNIA REPAIR N/A 06/11/4172   Umbilical hernia repair  . INSERTION OF DIALYSIS CATHETER N/A 01/27/2016   Procedure: INSERTION OF DIALYSIS CATHETER;  Surgeon: Waynetta Sandy, MD;  Location: [facility];  Service: Vascular;  Laterality: N/A;  . INSERTION OF MESH N/A 07/04/2012   Procedure: INSERTION OF MESH;  Surgeon: Madilyn Hook, DO;  Location: [facility];  Service: General;  Laterality: N/A;  . LAPAROSCOPIC LYSIS OF ADHESIONS N/A 02/12/2016   Procedure: LAPAROSCOPIC LYSIS OF ADHESIONS;  Surgeon: Jackolyn Confer, MD;  Location: [facility];  Service: General;  Laterality: N/A;  . LEFT AND RIGHT HEART CATHETERIZATION WITH CORONARY ANGIOGRAM N/A 08/04/2012   Procedure: LEFT AND RIGHT HEART CATHETERIZATION WITH CORONARY ANGIOGRAM;  Surgeon: Birdie Riddle, MD;  Location: [facility] CATH LAB;  Service: Cardiovascular;  Laterality: N/A;  . NEPHRECTOMY  2000   right  . REVISON OF ARTERIOVENOUS FISTULA Left 06/05/2013   Procedure: REVISON OF LEFT RADIOCEPHALIC  ARTERIOVENOUS FISTULA;  Surgeon: Conrad Babbitt, MD;  Location: [facility];  Service: Vascular;  Laterality: Left;  . REVISON OF ARTERIOVENOUS FISTULA Left 01/27/2016   Procedure: REVISION OF LEFT UPPER EXTREMITY ARTERIOVENOUS FISTULA;  Surgeon: Waynetta Sandy, MD;  Location: [facility];  Service: Vascular;  Laterality: Left;  . REVISON OF ARTERIOVENOUS FISTULA Left 08/03/2016   Procedure: REVISON OF Left arm ARTERIOVENOUS FISTULA;  Surgeon: Elam Dutch, MD;  Location: [facility];  Service: Vascular;  Laterality: Left;  . REVISON OF ARTERIOVENOUS  FISTULA Left 05/06/2017   Procedure: REVISION PLICATION OF ARTERIOVENOUS FISTULA LEFT ARM;  Surgeon: Rosetta Posner, MD;  Location: [facility];  Service: Vascular;  Laterality: Left;  . RIGHT HEART CATHETERIZATION N/A 05/21/2011   Procedure: RIGHT HEART CATH;  Surgeon: Birdie Riddle, MD;  Location: [facility] C[facility] LAB;  Service: Cardiovascular;  Laterality: N/A;  . smashed  1990's   "left pinky; have a plate in there"  . UMBILICAL HERNIA REPAIR N/A 07/04/2012   Procedure: LAPAROSCOPIC UMBILICAL HERNIA;  Surgeon: Madilyn Hook, DO;  Location: [facility];  Service: General;  Laterality: N/A;  . US ECHOCARDIOGRAPHY  05/20/11       Home Medications    Prior to Admission medications   Medication Sig Start Date End Date Taking? Authorizing Provider  allopurinol (ZYLOPRIM) 300 MG tablet Take 150 mg by mouth every other day.   Yes [provider]  amiodarone (PACERONE) 200 MG tablet Take 0.5 tablets (100 mg total) by mouth daily. 07/05/15  Yes Dixie Dials, MD  calcium acetate (PHOSLO) 667 MG capsule  Take 2,001 mg by mouth 3 (three) times daily with meals.  01/17/17  Yes [provider]  ferric citrate (AURYXIA) 1 GM 210 MG(Fe) tablet Take 420 mg by mouth 3 (three) times daily with meals.   Yes [provider]  lidocaine-prilocaine (EMLA) cream Apply 1 application topically every Monday, Wednesday, and Friday. Apply to port access prior to dialysis 04/08/15  Yes [provider]  midodrine (PROAMATINE) 10 MG tablet Take 0.5 tablets (5 mg total) by mouth every Monday, Wednesday, and Friday with hemodialysis. Take on the morning of dialysis days Patient taking differently: Take 5 mg by mouth as needed (may take 5mg  by mouth before dialysis - may repeat x1).  03/18/17  Yes Rai, Ripudeep K, MD  Multiple Vitamin (MULTIVITAMIN) tablet Take 1 tablet by mouth daily.   Yes [provider]  nitroGLYCERIN (NITROSTAT) 0.4 MG SL tablet Place 0.4 mg under the tongue every 5 (five) minutes  as needed for chest pain.  05/20/15  Yes [provider]  Nutritional Supplements (FEEDING SUPPLEMENT, NEPRO CARB STEADY,) LIQD Take 240 mLs by mouth 2 (two) times daily.   Yes [provider]  OVER THE COUNTER MEDICATION Take 1 tablet by mouth at bedtime. B complex-vitamin c-folic acid supplement   Yes [provider]  oxyCODONE-acetaminophen (PERCOCET/ROXICET) 5-325 MG tablet Take 1 tablet by mouth daily as needed for pain. 02/17/17  Yes [provider]  pantoprazole (PROTONIX) 40 MG tablet Take 40 mg by mouth 2 (two) times daily.   Yes [provider]  warfarin (COUMADIN) 7.5 MG tablet Take 7.5 mg by mouth daily at 6 PM.   Yes [provider]  sevelamer carbonate (RENVELA) 800 MG tablet Take 2-3 tablets (1,600-2,400 mg total) by mouth See admin instructions. 1600 mg with snacks and 2400 mg with meals Patient not taking: Reported on 07/17/2017 08/03/16   Alvia Grove, PA-C    Family History Family History  Problem Relation Age of Onset  . Hypertension Mother   . Kidney disease Mother     Social History Social History   Tobacco Use  . Smoking status: Never Smoker  . Smokeless tobacco: Never Used  Substance Use Topics  . Alcohol use: No    Alcohol/week: 0.0 oz  . Drug use: No    Comment: Past hx-none in 20 yrs     Allergies   Ace inhibitors   Review of Systems Review of Systems  Respiratory: Positive for cough and shortness of breath.   Cardiovascular: Positive for chest pain.  All other systems reviewed and are negative.    Physical Exam Updated Vital Signs BP (!) 139/94   Pulse 95   Resp (!) 22   SpO2 98%   Physical Exam  Constitutional: He is oriented to person, place, and time. He appears well-developed. He appears distressed.  HENT:  Head: Normocephalic and atraumatic.  Eyes:  Right eye blind (chronic)  Neck: Normal range of motion. Neck supple.  Cardiovascular: Intact distal pulses and normal  pulses. Tachycardia present.  Pulmonary/Chest: Tachypnea noted. He has rales.  Abdominal: Soft. Bowel sounds are normal.  Musculoskeletal: Normal range of motion.       Right lower leg: Normal.       Left lower leg: Normal.  Neurological: He is alert and oriented to person, place, and time.  Skin: Skin is warm and dry. Capillary refill takes less than 2 seconds.  Psychiatric: He has a normal mood and affect. His behavior is normal.  Nursing  note and vitals reviewed.    ED Treatments / Results  Labs (all labs ordered are listed, but only abnormal results are displayed) Labs Reviewed  BASIC METABOLIC PANEL - Abnormal; Notable for the following components:      Result Value   Sodium 134 (*)    Potassium 5.4 (*)    Chloride 96 (*)    BUN 32 (*)    Creatinine, Ser 11.44 (*)    GFR calc non Af Amer 4 (*)    GFR calc Af Amer 5 (*)    All other components within normal limits  CBC - Abnormal; Notable for the following components:   RBC 3.92 (*)    Hemoglobin 11.8 (*)    HCT 37.4 (*)    All other components within normal limits  PROTIME-INR - Abnormal; Notable for the following components:   Prothrombin Time 28.7 (*)    All other components within normal limits  I-STAT TROPONIN, ED - Abnormal; Notable for the following components:   Troponin i, poc 0.09 (*)    All other components within normal limits  INFLUENZA PANEL BY PCR (TYPE A & B)  I-STAT TROPONIN, ED    EKG  EKG Interpretation  Date/Time:  Sunday July 17 2017 07:09:16 EST Ventricular Rate:  110 PR Interval:  170 QRS Duration: 106 QT Interval:  358 QTC Calculation: 484 R Axis:   -22 Text Interpretation:  Sinus tachycardia Possible Left atrial enlargement Left ventricular hypertrophy ST & T wave abnormality, consider lateral ischemia Abnormal ECG No significant change since last tracing Confirmed by Isla Pence [phone]) on 07/17/2017 7:19:08 AM       Radiology Dg Chest 2 View  Result Date:  07/17/2017 CLINICAL DATA:  Chest tightness.  Nonproductive cough for 1 week. EXAM: CHEST  2 VIEW COMPARISON:  03/16/2017 FINDINGS: Moderate cardiomegaly. Mild vascular congestion without interstitial edema. No pneumothorax. No pleural effusion. IMPRESSION: Cardiomegaly and mild vascular congestion without interstitial edema. Electronically Signed   By: Marybelle Killings M.D.   On: 07/17/2017 08:08    Procedures Procedures (including critical care time)  Medications Ordered in ED Medications  cefTRIAXone (ROCEPHIN) 1 g in sodium chloride 0.9 % 100 mL IVPB (not administered)  azithromycin (ZITHROMAX) tablet 500 mg (not administered)     Initial Impression / Assessment and Plan / ED Course  I have reviewed the triage vital signs and the nursing notes.  Pertinent labs & imaging results that were available during my care of the patient were reviewed by me and considered in my medical decision making (see chart for details).  Pt ambulated short distance and O2 sat dropped to 78%.  Pt d/w Dr. Jonnie Finner (nephrology) who will see pt and determine if he needs dialysis today or he can wait until tomorrow.  I did give him abx also in case he's developing bronchitis/early pna.  Pt d/w Dr. Evangeline Gula (triad) for admission.   Final Clinical Impressions(s) / ED Diagnoses   Final diagnoses:  Acute on chronic congestive heart failure, unspecified heart failure type ([place])  ESRD on hemodialysis [facility])  Hypoxia  Bronchitis    ED Discharge Orders    None       Isla Pence, MD 07/17/17 1215

## 2017-07-17 NOTE — ED Notes (Signed)
Nephro PA at bedside

## 2017-07-17 NOTE — ED Notes (Signed)
Pt ambulated 20 feet, O2 sats dropped to 78%, RR increased to 35.  Pt's sats returned to 92%, RR to 26, after returning to bed.  Dr. Gilford Raid made aware.

## 2017-07-17 NOTE — ED Notes (Signed)
ED Provider at bedside. 

## 2017-07-17 NOTE — H&P (Signed)
History and Physical    [name] MVE:[id number] DOB: 03-24-1961 DOA: 07/17/2017  PCP: Iona Beard, MD   Patient coming from: Home  I have personally briefly reviewed patient's old medical records in [place]  Chief Complaint: Shortness of breath  HPI: [name] is a 57 y.o. 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  Patient complains of shortness of breath associated with a cough without any sputum production.  He has not gotten a flu shot he was tested for the flu in the emergency department and was negative.  He states his symptoms began on Wednesday when he felt more short of breath after hemodialysis.  He got no relief at hemodialysis he has had no fever and no significant congestion or sputum production.  Chest x-ray obtained in the emergency department shows fluid overload.  Patient states that he has a diagnosis of congestive heart failure has slept in his recliner since 2002.  He does not sleep flat because he has been able to since early 2002.  He says that even in his recliner since Wednesday he is becoming more short of breath.  He said when he walks he just gives out.  On Friday his granddaughter had a father daughter dance and he did not want to miss it because she was really counting on him going and he went but since then he has been feeling quite poorly.  He lives with his wife and his daughter.  His daughter is a smoker but she does not smoke in the house.  He states that he has a dry weight of 113.5 pounds and he has been regularly dialyzed to that weight despite still feeling short of breath.  When asked if he  was losing weight he says that his pants are getting bigger and they are falling off of his waist.  That was not the case in the fall when they were significantly tighter.  Of note in addition is that the patient saw Dr. Karena Addison in November who stated that there was no further treatment available for him for his heart failure.  He is not a heart kidney transplant candidate.  He has been taken off of all his congestive heart failure meds due to hypotension.  In the emergency room the patient was evaluated he was found to not have the flu, his chest x-ray shows some congestion and fluid volume overload, and on ambulation his sats dropped to 78% and therefore is being admitted to the hospital for further evaluation and management of profound acute respiratory failure in the face of cardiomyopathy with an ejection fraction of 30-35% with nonfunctioning kidneys on hemodialysis.   Review of Systems: As per HPI otherwise complete review of systems negative.    Past Medical History:  Diagnosis Date  . Atrial flutter ([place])   . 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 Cardiomyopathy   . CHF (congestive heart failure) ([place])   . Coronary artery disease    normal coronaries by 10/10/08 cath, Cardiac Cath 08-04-12 epic.Dr. Doylene Canard follows  . Diabetes mellitus    pt. states he's borderline diabetic., no longer taking med,- off med. since  2013  . Dialysis patient [facility])    Mon-Wed-Fri([place])- Left AV fistula  . Diverticulitis November 2016   reoccurred in December 2016  . ESRD (end stage renal disease) ([place])   . GERD (gastroesophageal reflux disease)   . Gout   . History of nephrectomy 07/04/2012   History of right nephrectomy in 2002 for renal cell carcinoma   . History of unilateral nephrectomy   . Hypertension   . Low iron   . Myocardial infarction [facility]) ?2006  . Renal cell carcinoma    dialysis- MWF- Dr. Mercy Moore follows.  . Renal insufficiency   . Shortness  of breath 05/19/11   "at rest, lying down, w/exertion"  . Stroke [facility]) 02/2011   05/19/11 denies residual  . Umbilical hernia 56/81/27   unrepaired    Past Surgical History:  Procedure Laterality Date  . AV FISTULA PLACEMENT  03/29/2011   Procedure: ARTERIOVENOUS (AV) FISTULA CREATION;  Surgeon: Hinda Lenis, MD;  Location: [place];  Service: Vascular;  Laterality: Left;  LEFT RADIOCEPHALIC , Arteriovenous [id number])  . CARDIAC CATHETERIZATION  05/21/11  . CARDIAC CATHETERIZATION N/A 03/16/2016   Procedure: Left Heart Cath and Coronary Angiography;  Surgeon: Dixie Dials, MD;  Location: [place] CV LAB;  Service: Cardiovascular;  Laterality: N/A;  . CHOLECYSTECTOMY N/A 02/12/2016   Procedure: LAPAROSCOPIC CHOLECYSTECTOMY WITH INTRAOPERATIVE CHOLANGIOGRAM;  Surgeon: Jackolyn Confer, MD;  Location: [place];  Service: General;  Laterality: N/A;  . COLONOSCOPY WITH PROPOFOL N/A 02/01/2017   Procedure: COLONOSCOPY WITH PROPOFOL;  Surgeon: Juanita Craver, MD;  Location: [facility] ENDOS[facility];  Service: Endoscopy;  Laterality: N/A;  . dialysis cath placed    . ESOPHAGOGASTRODUODENOSCOPY (EGD) WITH PROPOFOL N/A 12/02/2015   Procedure: ESOPHAGOGASTRODUODENOSCOPY (EGD) WITH PROPOFOL;  Surgeon: Juanita Craver, MD;  Location: [facility];  Service: Endoscopy;  Laterality: N/A;  . FINGER SURGERY     L pinkie finger- ORIF- /w remaining hardware - 1990's    . HERNIA REPAIR N/A 6/75/9163   Umbilical hernia repair  . INSERTION OF DIALYSIS CATHETER N/A 01/27/2016   Procedure: INSERTION OF DIALYSIS CATHETER;  Surgeon: Waynetta Sandy, MD;  Location: [place];  Service: Vascular;  Laterality: N/A;  . INSERTION OF MESH N/A 07/04/2012   Procedure: INSERTION OF MESH;  Surgeon: Madilyn Hook, DO;  Location: [place];  Service: General;  Laterality: N/A;  . LAPAROSCOPIC LYSIS OF ADHESIONS N/A 02/12/2016   Procedure: LAPAROSCOPIC LYSIS OF ADHESIONS;  Surgeon: Jackolyn Confer, MD;  Location: [place];  Service: General;   Laterality: N/A;  . LEFT AND RIGHT HEART CATHETERIZATION WITH CORONARY ANGIOGRAM N/A 08/04/2012   Procedure: LEFT AND RIGHT HEART CATHETERIZATION WITH CORONARY ANGIOGRAM;  Surgeon: Birdie Riddle, MD;  Location: [place] CATH LAB;  Service: Cardiovascular;  Laterality: N/A;  . NEPHRECTOMY  2000   right  . REVISON OF ARTERIOVENOUS FISTULA Left 06/05/2013   Procedure: REVISON OF LEFT RADIOCEPHALIC  ARTERIOVENOUS FISTULA;  Surgeon: Conrad Hazel, MD;  Location: [place];  Service: Vascular;  Laterality: Left;  . REVISON OF ARTERIOVENOUS FISTULA Left 01/27/2016   Procedure: REVISION OF LEFT UPPER EXTREMITY ARTERIOVENOUS FISTULA;  Surgeon: Waynetta Sandy, MD;  Location: [place];  Service: Vascular;  Laterality: Left;  . REVISON OF ARTERIOVENOUS FISTULA Left 08/03/2016   Procedure: REVISON OF Left arm ARTERIOVENOUS FISTULA;  Surgeon: Elam Dutch, MD;  Location: [place];  Service: Vascular;  Laterality: Left;  . REVISON OF ARTERIOVENOUS FISTULA Left 05/06/2017   Procedure: REVISION PLICATION OF ARTERIOVENOUS FISTULA LEFT  ARM;  Surgeon: Rosetta Posner, MD;  Location: [place];  Service: Vascular;  Laterality: Left;  . RIGHT HEART CATHETERIZATION N/A 05/21/2011   Procedure: RIGHT HEART CATH;  Surgeon: Birdie Riddle, MD;  Location: [facility] CATH LAB;  Service: Cardiovascular;  Laterality: N/A;  . smashed  1990's   "left pinky; have a plate in there"  . UMBILICAL HERNIA REPAIR N/A 07/04/2012   Procedure: LAPAROSCOPIC UMBILICAL HERNIA;  Surgeon: Madilyn Hook, DO;  Location: [place];  Service: General;  Laterality: N/A;  . US ECHOCARDIOGRAPHY  05/20/11     reports that  has never smoked. he has never used smokeless tobacco. He reports that he does not drink alcohol or use drugs.  Allergies  Allergen Reactions  . Ace Inhibitors Itching and Cough    COUGH > INTOLERANCE    Family History  Problem Relation Age of Onset  . Hypertension Mother   . Kidney disease Mother    His father is alive at 45 years old has no  medical problems and continues to go to work every day.  He states there is no family history of diabetes.  Prior to Admission medications   Medication Sig Start Date End Date Taking? Authorizing Provider  allopurinol (ZYLOPRIM) 300 MG tablet Take 150 mg by mouth every other day.   Yes [provider]  amiodarone (PACERONE) 200 MG tablet Take 0.5 tablets (100 mg total) by mouth daily. 07/05/15  Yes Dixie Dials, MD  calcium acetate (PHOSLO) 667 MG capsule Take 2,001 mg by mouth 3 (three) times daily with meals.  01/17/17  Yes [provider]  ferric citrate (AURYXIA) 1 GM 210 MG(Fe) tablet Take 420 mg by mouth 3 (three) times daily with meals.   Yes [provider]  lidocaine-prilocaine (EMLA) cream Apply 1 application topically every Monday, Wednesday, and Friday. Apply to port access prior to dialysis 04/08/15  Yes [provider]  midodrine (PROAMATINE) 10 MG tablet Take 0.5 tablets (5 mg total) by mouth every Monday, Wednesday, and Friday with hemodialysis. Take on the morning of dialysis days Patient taking differently: Take 5 mg by mouth as needed (may take 5mg  by mouth before dialysis - may repeat x1).  03/18/17  Yes Rai, Ripudeep K, MD  Multiple Vitamin (MULTIVITAMIN) tablet Take 1 tablet by mouth daily.   Yes [provider]  nitroGLYCERIN (NITROSTAT) 0.4 MG SL tablet Place 0.4 mg under the tongue every 5 (five) minutes as needed for chest pain.  05/20/15  Yes [provider]  Nutritional Supplements (FEEDING SUPPLEMENT, NEPRO CARB STEADY,) LIQD Take 240 mLs by mouth 2 (two) times daily.   Yes [provider]  OVER THE COUNTER MEDICATION Take 1 tablet by mouth at bedtime. B complex-vitamin c-folic acid supplement   Yes [provider]  oxyCODONE-acetaminophen (PERCOCET/ROXICET) 5-325 MG tablet Take 1 tablet by mouth daily as needed for pain. 02/17/17  Yes [provider]  pantoprazole (PROTONIX) 40 MG tablet Take  40 mg by mouth 2 (two) times daily.   Yes [provider]  warfarin (COUMADIN) 7.5 MG tablet Take 7.5 mg by mouth daily at 6 PM.   Yes [provider]    Physical Exam: Vitals:   07/17/17 1100 07/17/17 1130 07/17/17 1145 07/17/17 1230  BP: (!) 156/109 (!) 142/100 (!) 139/94 (!) 144/96  Pulse: 94 95 95 96  Resp: (!) 23 18 (!) 22 20  SpO2: 97% 100% 98% 100%   .TCS Constitutional: NAD, calm, comfortable Vitals:  07/17/17 1100 07/17/17 1130 07/17/17 1145 07/17/17 1230  BP: (!) 156/109 (!) 142/100 (!) 139/94 (!) 144/96  Pulse: 94 95 95 96  Resp: (!) 23 18 (!) 22 20  SpO2: 97% 100% 98% 100%   Eyes: PERRL, lids and conjunctivae normal ENMT: Mucous membranes are moist. Posterior pharynx clear of any exudate or lesions.Normal dentition.  Neck: normal, supple, no masses, no thyromegaly, no JVD no HJR Respiratory: Bilateral decreased breath sounds, no wheezing, crackles and rales at the bases. Normal respiratory effort. No accessory muscle use.  Cardiovascular: Regular rate and rhythm, no murmurs / rubs / gallops. No extremity edema. 2+ pedal pulses. No carotid bruits.  Abdomen: no tenderness, no masses palpated. No hepatosplenomegaly. Bowel sounds positive.  Musculoskeletal: no clubbing / cyanosis. No joint deformity upper and lower extremities. Good ROM, no contractures. Normal muscle tone.  Skin: no rashes, lesions, ulcers. No induration, no peripheral edema Neurologic: CN 2-12 grossly intact. Sensation intact, DTR normal. Strength 5/5 in all 4.  Psychiatric: Normal judgment and insight. Alert and oriented x 3. Normal mood.     Labs on Admission: I have personally reviewed following labs and imaging studies  CBC: Recent Labs  Lab 07/17/17 0710  WBC 5.0  HGB 11.8*  HCT 37.4*  MCV 95.4  PLT 124   Basic Metabolic Panel: Recent Labs  Lab 07/17/17 0710  NA 134*  K 5.4*  CL 96*  CO2 23  GLUCOSE 84  BUN 32*  CREATININE 11.44*  CALCIUM 9.4   GFR: CrCl  cannot be calculated (Unknown ideal weight.). Liver Function Tests: No results for input(s): AST, ALT, ALKPHOS, BILITOT, PROT, ALBUMIN in the last 168 hours. No results for input(s): LIPASE, AMYLASE in the last 168 hours. No results for input(s): AMMONIA in the last 168 hours. Coagulation Profile: Recent Labs  Lab 07/17/17 0710  INR 2.72   Cardiac Enzymes: No results for input(s): CKTOTAL, CKMB, CKMBINDEX, TROPONINI in the last 168 hours. BNP (last 3 results) No results for input(s): PROBNP in the last 8760 hours. HbA1C: No results for input(s): HGBA1C in the last 72 hours. CBG: No results for input(s): GLUCAP in the last 168 hours. Lipid Profile: No results for input(s): CHOL, HDL, LDLCALC, TRIG, CHOLHDL, LDLDIRECT in the last 72 hours. Thyroid Function Tests: No results for input(s): TSH, T4TOTAL, FREET4, T3FREE, THYROIDAB in the last 72 hours. Anemia Panel: No results for input(s): VITAMINB12, FOLATE, FERRITIN, TIBC, IRON, RETICCTPCT in the last 72 hours. Urine analysis:    Component Value Date/Time   COLORURINE YELLOW 05/19/2011 [place] 05/19/2011 1042   LABSPEC 1.013 05/19/2011 1042   PHURINE 6.5 05/19/2011 1042   GLUCOSEU NEGATIVE 05/19/2011 1042   HGBUR TRACE (A) 05/19/2011 1042   BILIRUBINUR NEGATIVE 05/19/2011 1042   KETONESUR NEGATIVE 05/19/2011 1042   PROTEINUR >300 (A) 05/19/2011 1042   UROBILINOGEN 0.2 05/19/2011 1042   NITRITE NEGATIVE 05/19/2011 1042   LEUKOCYTESUR NEGATIVE 05/19/2011 1042    Radiological Exams on Admission: Dg Chest 2 View  Result Date: 07/17/2017 CLINICAL DATA:  Chest tightness.  Nonproductive cough for 1 week. EXAM: CHEST  2 VIEW COMPARISON:  03/16/2017 FINDINGS: Moderate cardiomegaly. Mild vascular congestion without interstitial edema. No pneumothorax. No pleural effusion. IMPRESSION: Cardiomegaly and mild vascular congestion without interstitial edema. Electronically Signed   By: Marybelle Killings M.D.   On: 07/17/2017  08:08    EKG: Independently reviewed. Sinus tachycardia, Possible Left atrial enlargement, Left ventricular hypertrophy ST & T wave abnormality not significantly changed from  prior.  Assessment/Plan Principal Problem:   Acute respiratory failure with hypoxemia (HCC) Active Problems:   Acute on chronic combined systolic and diastolic CHF, NYHA class 4 (HCC)   DM II (diabetes mellitus, type II), controlled (HCC)   End-stage renal disease on hemodialysis (HCC)   Paroxysmal atrial flutter (HCC)   Blind right eye   1.  Acute respiratory failure with hypoxemia: Very complex problem in a patient with profound systolic and diastolic congestive heart failure with hypotension.  This is an ominous sign given that the patient is unable to tolerate treatment for his CHF.  Obviously diuretics were working him as he does not make urine.  Dialysis has not been effective recently.  I suspect that the patient has some underlying muscle mass weight loss as he states he his appetite has been poor recently.  He may be losing muscle mass weight but still being dialyzed to his previous dry weight.  It is possible that his dry weight is now lower than 113.5 kg.  Dr. Soyla Murphy from nephrology has been notified and at this point is planning to dialyze him.  Patient is currently comfortable satting 100% on room air with oxygen at 3-4 L/min.  2.  Acute on chronic combined systolic and diastolic congestive heart failure New York Heart Association class IV: Patient has had significant worsening to the point where he cannot ambulate at all.  He is short of breath at rest.  He is unable to sleep at night.  This is quite an ominous sign a patient on hemodialysis.  Etiology has stated that they have no further treatments to offer the patient.  He is anticoagulated on warfarin.  I am concerned that the patient may not realize the severity of his situation.  I have requested cardiology consultation.  If there is nothing further to  offer I am going to ask that they recommend hospice care in which case we will be happy to have palliative care get involved and see the patient.  However, this patient's relatively young he has complete control of his faculties and I think that this would be a devastating situation.  3.  Diabetes mellitus type 2 controlled: Patient's been off medication since 2013.  Likely due to his worsening renal failure.  His creatinine today is 11 his glucoses are acceptable will monitor.  4.  End-stage renal disease on hemodialysis: Patient is dialyzed Monday Wednesday Friday as noted above hopefully we can try to dialyze him to less than his currently dry weight.  He has had problems on dialysis due to having to stop early due to cramps.  He states that he has frequently been dialyzed to his dry weight however.  I suspect that his dry weight may now be significantly lower.  Appreciate renal input.  5.  Paroxysmal atrial flutter: Currently in sinus tachycardia.  Will continue home medications as able.  6.  Blindness in the right eye: Noted.   DVT prophylaxis: Chronically anticoagulated on warfarin Code Status: Full code Family Communication: Patient alert awake oriented retains capacity and is here alone.  Did not request I call any other family members. Disposition Plan: Likely home in 3-4 days Consults called: Dr. Soyla Murphy from nephrology: Dr. Radford Pax from cardiology Admission status: Inpatient   Lady Deutscher MD [facility] Pager [phone]  If 7PM-7AM, please contact night-coverage [email or web address] Password [email or web address]  07/17/2017, 1:00 PM

## 2017-07-17 NOTE — ED Triage Notes (Signed)
Pt to ER for evaluation of chest tightness and non-productive cough x1 week. Pt in NAD. Dialysis patient, fistula present to left arm. Last treatment Friday (MWF schedule). Pt in NAD. VSS.

## 2017-07-17 NOTE — Consult Note (Addendum)
[facility] Renal Consultation Note  Indication for Consultation:  Management of ESRD/hemodialysis; anemia, hypertension/volume and secondary hyperparathyroidism  HPI: [name] is a 57 y.o. male. ESRD 2/2  HTN and solitary kidney first HD 05/2010 ,chronic pain syndrome,hx CVAs,hx MI [place] EF 25% 12/2014, adm 11/2014, and 04/2016  - colonic diverticulitis, no perf or abscess, mch 2/3-2/03/2016 aflutter -NSR at d/c now on coumadin - Dr. Devona Konig  Adm [facility] 10/24-10/25/18 unresponsiveness/syncope. ? Hypotension VS metabolic derangement. EDW raised.      Presents to ER with Progressive Sob past week/dry cough and cp past 24 hr. Denies fever, chills or sick contacts.He is very complaint with HD (MWF [place]  Leaving at EDW 113.5 kg most txs without  bp dropping) He ambulated short distance in ER and O 2 sat dropped to 78 % with dyspnea ,[place] O2  Applied and improved to 100 % now supine in bed. CXR =Cardiomegaly and mild vascular congestion without interstitial edema. HGB 11.8 , wbc 5.0 , K 5.4 ,Na 134,  Influenza A, B by PCR Neg / INR 2.72      Past Medical History:  Diagnosis Date  . Atrial flutter ([place])   . 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 Cardiomyopathy   . CHF (congestive heart failure) ([place])   . Coronary artery disease    normal coronaries by 10/10/08 cath, Cardiac Cath 08-04-12 epic.Dr. Doylene Canard follows  . Diabetes mellitus    pt. states he's borderline diabetic., no longer taking med,- off med. since 2013  . Dialysis patient [facility])    Mon-Wed-Fri([place])- Left AV fistula  . Diverticulitis November 2016   reoccurred in December 2016  . ESRD (end stage renal disease) ([place])   . GERD (gastroesophageal reflux disease)   . Gout   . History of nephrectomy 07/04/2012   History of right nephrectomy in 2002 for renal cell carcinoma   . History of unilateral nephrectomy   . Hypertension   . Low iron   . Myocardial infarction [facility])  ?2006  . Renal cell carcinoma    dialysis- MWF- Dr. Mercy Moore follows.  . Renal insufficiency   . Shortness of breath 05/19/11   "at rest, lying down, w/exertion"  . Stroke [facility]) 02/2011   05/19/11 denies residual  . Umbilical hernia 13/24/40   unrepaired    Past Surgical History:  Procedure Laterality Date  . AV FISTULA PLACEMENT  03/29/2011   Procedure: ARTERIOVENOUS (AV) FISTULA CREATION;  Surgeon: Hinda Lenis, MD;  Location: [place];  Service: Vascular;  Laterality: Left;  LEFT RADIOCEPHALIC , Arteriovenous [id number])  . CARDIAC CATHETERIZATION  05/21/11  . CARDIAC CATHETERIZATION N/A 03/16/2016   Procedure: Left Heart Cath and Coronary Angiography;  Surgeon: Dixie Dials, MD;  Location: [place] CV LAB;  Service: Cardiovascular;  Laterality: N/A;  . CHOLECYSTECTOMY N/A 02/12/2016   Procedure: LAPAROSCOPIC CHOLECYSTECTOMY WITH INTRAOPERATIVE CHOLANGIOGRAM;  Surgeon: Jackolyn Confer, MD;  Location: [place];  Service: General;  Laterality: N/A;  . COLONOSCOPY WITH PROPOFOL N/A 02/01/2017   Procedure: COLONOSCOPY WITH PROPOFOL;  Surgeon: Juanita Craver, MD;  Location: [facility];  Service: Endoscopy;  Laterality: N/A;  . dialysis cath placed    . ESOPHAGOGASTRODUODENOSCOPY (EGD) WITH PROPOFOL N/A 12/02/2015   Procedure: ESOPHAGOGASTRODUODENOSCOPY (EGD) WITH PROPOFOL;  Surgeon: Juanita Craver, MD;  [facility];  Service: Endoscopy;  Laterality: N/A;  . FINGER SURGERY     L pinkie finger- ORIF- /w remaining hardware - 1990's    .  HERNIA REPAIR N/A 7/86/7672   Umbilical hernia repair  . INSERTION OF DIALYSIS CATHETER N/A 01/27/2016   Procedure: INSERTION OF DIALYSIS CATHETER;  Surgeon: Waynetta Sandy, MD;  Location: [place];  Service: Vascular;  Laterality: N/A;  . INSERTION OF MESH N/A 07/04/2012   Procedure: INSERTION OF MESH;  Surgeon: Madilyn Hook, DO;  Location: [place];  Service: General;  Laterality: N/A;  . LAPAROSCOPIC LYSIS OF ADHESIONS N/A 02/12/2016    Procedure: LAPAROSCOPIC LYSIS OF ADHESIONS;  Surgeon: Jackolyn Confer, MD;  Location: [place];  Service: General;  Laterality: N/A;  . LEFT AND RIGHT HEART CATHETERIZATION WITH CORONARY ANGIOGRAM N/A 08/04/2012   Procedure: LEFT AND RIGHT HEART CATHETERIZATION WITH CORONARY ANGIOGRAM;  Surgeon: Birdie Riddle, MD;  Location: [place] CATH LAB;  Service: Cardiovascular;  Laterality: N/A;  . NEPHRECTOMY  2000   right  . REVISON OF ARTERIOVENOUS FISTULA Left 06/05/2013   Procedure: REVISON OF LEFT RADIOCEPHALIC  ARTERIOVENOUS FISTULA;  Surgeon: Conrad Parks, MD;  Location: [place];  Service: Vascular;  Laterality: Left;  . REVISON OF ARTERIOVENOUS FISTULA Left 01/27/2016   Procedure: REVISION OF LEFT UPPER EXTREMITY ARTERIOVENOUS FISTULA;  Surgeon: Waynetta Sandy, MD;  Location: [place];  Service: Vascular;  Laterality: Left;  . REVISON OF ARTERIOVENOUS FISTULA Left 08/03/2016   Procedure: REVISON OF Left arm ARTERIOVENOUS FISTULA;  Surgeon: Elam Dutch, MD;  Location: [place];  Service: Vascular;  Laterality: Left;  . REVISON OF ARTERIOVENOUS FISTULA Left 05/06/2017   Procedure: REVISION PLICATION OF ARTERIOVENOUS FISTULA LEFT ARM;  Surgeon: Rosetta Posner, MD;  Location: [place];  Service: Vascular;  Laterality: Left;  . RIGHT HEART CATHETERIZATION N/A 05/21/2011   Procedure: RIGHT HEART CATH;  Surgeon: Birdie Riddle, MD;  Location: [facility] CATH LAB;  Service: Cardiovascular;  Laterality: N/A;  . smashed  1990's   "left pinky; have a plate in there"  . UMBILICAL HERNIA REPAIR N/A 07/04/2012   Procedure: LAPAROSCOPIC UMBILICAL HERNIA;  Surgeon: Madilyn Hook, DO;  Location: [place];  Service: General;  Laterality: N/A;  . US ECHOCARDIOGRAPHY  05/20/11      Family History  Problem Relation Age of Onset  . Hypertension Mother   . Kidney disease Mother       reports that  has never smoked. he has never used smokeless tobacco. He reports that he does not drink alcohol or use drugs.   Allergies  Allergen  Reactions  . Ace Inhibitors Itching and Cough    COUGH > INTOLERANCE    Prior to Admission medications   Medication Sig Start Date End Date Taking? Authorizing Provider  allopurinol (ZYLOPRIM) 300 MG tablet Take 150 mg by mouth every other day.   Yes [provider]  amiodarone (PACERONE) 200 MG tablet Take 0.5 tablets (100 mg total) by mouth daily. 07/05/15  Yes Dixie Dials, MD  calcium acetate (PHOSLO) 667 MG capsule Take 2,001 mg by mouth 3 (three) times daily with meals.  01/17/17  Yes [provider]  ferric citrate (AURYXIA) 1 GM 210 MG(Fe) tablet Take 420 mg by mouth 3 (three) times daily with meals.   Yes [provider]  lidocaine-prilocaine (EMLA) cream Apply 1 application topically every Monday, Wednesday, and Friday. Apply to port access prior to dialysis 04/08/15  Yes [provider]  midodrine (PROAMATINE) 10 MG tablet Take 0.5 tablets (5 mg total) by mouth every Monday, Wednesday, and Friday with hemodialysis. Take on the morning of dialysis days Patient taking differently: Take  5 mg by mouth as needed (may take '5mg'$  by mouth before dialysis - may repeat x1).  03/18/17  Yes Rai, Ripudeep K, MD  Multiple Vitamin (MULTIVITAMIN) tablet Take 1 tablet by mouth daily.   Yes [provider]  nitroGLYCERIN (NITROSTAT) 0.4 MG SL tablet Place 0.4 mg under the tongue every 5 (five) minutes as needed for chest pain.  05/20/15  Yes [provider]  Nutritional Supplements (FEEDING SUPPLEMENT, NEPRO CARB STEADY,) LIQD Take 240 mLs by mouth 2 (two) times daily.   Yes [provider]  OVER THE COUNTER MEDICATION Take 1 tablet by mouth at bedtime. B complex-vitamin c-folic acid supplement   Yes [provider]  oxyCODONE-acetaminophen (PERCOCET/ROXICET) 5-325 MG tablet Take 1 tablet by mouth daily as needed for pain. 02/17/17  Yes [provider]  pantoprazole (PROTONIX) 40 MG tablet Take 40 mg by mouth 2 (two) times  daily.   Yes [provider]  warfarin (COUMADIN) 7.5 MG tablet Take 7.5 mg by mouth daily at 6 PM.   Yes [provider]  sevelamer carbonate (RENVELA) 800 MG tablet Take 2-3 tablets (1,600-2,400 mg total) by mouth See admin instructions. 1600 mg with snacks and 2400 mg with meals Patient not taking: Reported on 07/17/2017 08/03/16   Alvia Grove, PA-C    WNI:OEVOJJ chloride, acetaminophen **OR** acetaminophen, nitroGLYCERIN, oxyCODONE, polyethylene glycol, sodium chloride flush  Results for orders placed or performed during the hospital encounter of 07/17/17 (from the past 48 hour(s))  Basic metabolic panel     Status: Abnormal   Collection Time: 07/17/17  7:10 AM  Result Value Ref Range   Sodium 134 (L) 135 - 145 mmol/L   Potassium 5.4 (H) 3.5 - 5.1 mmol/L   Chloride 96 (L) 101 - 111 mmol/L   CO2 23 22 - 32 mmol/L   Glucose, Bld 84 65 - 99 mg/dL   BUN 32 (H) 6 - 20 mg/dL   Creatinine, Ser 11.44 (H) 0.61 - 1.24 mg/dL   Calcium 9.4 8.9 - 10.3 mg/dL   GFR calc non Af Amer 4 (L) >60 mL/min   GFR calc Af Amer 5 (L) >60 mL/min    Comment: (NOTE) The eGFR has been calculated using the CKD EPI equation. This calculation has not been validated in all clinical situations. eGFR's persistently <60 mL/min signify possible Chronic Kidney Disease.    Anion gap 15 5 - 15    Comment: Performed at [facility] [address], [place], [place] [address]  CBC     Status: Abnormal   Collection Time: 07/17/17  7:10 AM  Result Value Ref Range   WBC 5.0 4.0 - 10.5 K/uL   RBC 3.92 (L) 4.22 - 5.81 MIL/uL   Hemoglobin 11.8 (L) 13.0 - 17.0 g/dL   HCT 37.4 (L) 39.0 - 52.0 %   MCV 95.4 78.0 - 100.0 fL   MCH 30.1 26.0 - 34.0 pg   MCHC 31.6 30.0 - 36.0 g/dL   RDW 15.2 11.5 - 15.5 %   Platelets 220 150 - 400 K/uL    Comment: Performed at [facility], [place] [address], [place], [place] [address]  Protime-INR     Status: Abnormal   Collection Time: 07/17/17  7:10 AM   Result Value Ref Range   Prothrombin Time 28.7 (H) 11.4 - 15.2 seconds   INR 2.72     Comment: Performed at [facility] [address] [place], [place] [address]  I-stat  troponin, ED     Status: Abnormal   Collection Time: 07/17/17  7:21 AM  Result Value Ref Range   Troponin i, poc 0.09 (HH) 0.00 - 0.08 ng/mL   Comment NOTIFIED PHYSICIAN    Comment 3            Comment: Due to the release kinetics of cTnI, a negative result within the first hours of the onset of symptoms does not rule out myocardial infarction with certainty. If myocardial infarction is still suspected, repeat the test at appropriate intervals.   Influenza panel by PCR (type A & B)     Status: None   Collection Time: 07/17/17  8:21 AM  Result Value Ref Range   Influenza A By PCR NEGATIVE NEGATIVE   Influenza B By PCR NEGATIVE NEGATIVE    Comment: (NOTE) The Xpert Xpress Flu assay is intended as an aid in the diagnosis of  influenza and should not be used as a sole basis for treatment.  This  assay is FDA approved for nasopharyngeal swab specimens only. Nasal  washings and aspirates are unacceptable for Xpert Xpress Flu testing. Performed at [facility], [place] [address], [place], [place] [address]   I-Stat Troponin, ED (not at [facility])     Status: None   Collection Time: 07/17/17 10:43 AM  Result Value Ref Range   Troponin i, poc 0.07 0.00 - 0.08 ng/mL   Comment 3            Comment: Due to the release kinetics of cTnI, a negative result within the first hours of the onset of symptoms does not rule out myocardial infarction with certainty. If myocardial infarction is still suspected, repeat the test at appropriate intervals.      ROS: see hpi for positives    Physical Exam: Vitals:   07/17/17 1130 07/17/17 1145  BP: (!) 142/100 (!) 139/94  Pulse: 95 95  Resp: 18 (!) 22  SpO2: 100% 98%     General: alert obese AAM NAD , WD, WN Appropriate  HEENT: [place] , MMM, Not icteric  Neck: no jvd  ,supple Heart: RRR , soft 1/6 sem  ,no rub or gallop Lungs: faint rales , unlabored breathing at  Rest in Bed  Abdomen: Obese, bs pos soft , Nt, Nd  Extremities: No pedal edema  Skin: no rash  Neuro: alert moves all etrem, no acute focal deficits appreciated  Dialysis Access: LFA AVF  Pos bruit  Dialysis Orders: Center: [facility]  on MWF .Uf profile 4 EDW 113.5  HD Bath 2k, 2.25ca  Time 4hr Heparin 7000. Access LFA AVF  Meds = Hec 4 mcg qhd  ,Parsabiv  12.5 q hd, Venofer 50 mg q wed hd  Assessment/Plan 1. Sob with Hypoxia/ volume overload /= plan for uf with HD in am /  2. ESRD -  HD MWF  Schedule  3. Acute/chronic systolic+diastolic HF Ho CM = followed  by Dr  Haroldine Laws =ho ef 30 % On Midodrine for bp support on hd for uf 4.  Hypotension/volume  - as in 3.bp mildly ^ prob sec to Vol / Uf with hd and noted on Midodrine pre hd only MWF, ,needs lower edw/  5. Anemia  - hgb 11.9 hgb  No esa needs and on weekly venofer q wed hd  6. Metabolic bone disease - Hec on hd , (parsabiv not avail)Auryxia and calcium acetate binders listed  Ca 9.4 (hold calcium acetate in hosp)  7. HO A.Flutter =  Sinus on my exam /tele/  On Coumadin  /Amiodarone  8. DM type 2 - per admit   Ernest Haber, PA-C [place] [phone] 07/17/2017, 12:28 PM   Pt seen, examined and agree w A/P as above. ESRD pt with hx CM/ EF 30%, DM presenting with SOB and hypoxemia w/ ambulation.  Some degree of vol overload, early edema on CXR.  Doesn't need emergent HD, plan HD tomorrow , max UF as tolerated. Probably needs lower edw.  Kelly Splinter MD [employer] pager [phone]   07/17/2017, 5:19 PM

## 2017-07-17 NOTE — ED Notes (Signed)
Dr.Haviland informed of pt istat troponin level. Ed-lab

## 2017-07-18 DIAGNOSIS — I4892 Unspecified atrial flutter: Secondary | ICD-10-CM

## 2017-07-18 DIAGNOSIS — E1122 Type 2 diabetes mellitus with diabetic chronic kidney disease: Secondary | ICD-10-CM

## 2017-07-18 DIAGNOSIS — N186 End stage renal disease: Secondary | ICD-10-CM

## 2017-07-18 DIAGNOSIS — Z992 Dependence on renal dialysis: Secondary | ICD-10-CM

## 2017-07-18 DIAGNOSIS — I5043 Acute on chronic combined systolic (congestive) and diastolic (congestive) heart failure: Secondary | ICD-10-CM

## 2017-07-18 LAB — RENAL FUNCTION PANEL
Albumin: 3.2 g/dL — ABNORMAL LOW (ref 3.5–5.0)
Anion gap: 14 (ref 5–15)
BUN: 46 mg/dL — ABNORMAL HIGH (ref 6–20)
CO2: 25 mmol/L (ref 22–32)
Calcium: 9.2 mg/dL (ref 8.9–10.3)
Chloride: 96 mmol/L — ABNORMAL LOW (ref 101–111)
Creatinine, Ser: 13.73 mg/dL — ABNORMAL HIGH (ref 0.61–1.24)
GFR calc Af Amer: 4 mL/min — ABNORMAL LOW (ref >60)
GFR calc non Af Amer: 4 mL/min — ABNORMAL LOW (ref >60)
Glucose, Bld: 114 mg/dL — ABNORMAL HIGH (ref 65–99)
Phosphorus: 5.8 mg/dL — ABNORMAL HIGH (ref 2.5–4.6)
Potassium: 4.7 mmol/L (ref 3.5–5.1)
Sodium: 135 mmol/L (ref 135–145)

## 2017-07-18 LAB — BRAIN NATRIURETIC PEPTIDE: B Natriuretic Peptide: 1325.2 pg/mL — ABNORMAL HIGH (ref 0.0–100.0)

## 2017-07-18 LAB — CBC
HCT: 32.8 % — ABNORMAL LOW (ref 39.0–52.0)
Hemoglobin: 10.4 g/dL — ABNORMAL LOW (ref 13.0–17.0)
MCH: 30.5 pg (ref 26.0–34.0)
MCHC: 31.7 g/dL (ref 30.0–36.0)
MCV: 96.2 fL (ref 78.0–100.0)
Platelets: 185 10*3/uL (ref 150–400)
RBC: 3.41 MIL/uL — ABNORMAL LOW (ref 4.22–5.81)
RDW: 15.7 % — ABNORMAL HIGH (ref 11.5–15.5)
WBC: 4.8 10*3/uL (ref 4.0–10.5)

## 2017-07-18 LAB — PROTIME-INR
INR: 3.16
Prothrombin Time: 32.2 seconds — ABNORMAL HIGH (ref 11.4–15.2)

## 2017-07-18 LAB — MRSA PCR SCREENING: MRSA by PCR: NEGATIVE

## 2017-07-18 LAB — HIV ANTIBODY (ROUTINE TESTING W REFLEX): HIV Screen 4th Generation wRfx: NONREACTIVE

## 2017-07-18 LAB — HEPATITIS B SURFACE ANTIGEN: Hepatitis B Surface Ag: NEGATIVE

## 2017-07-18 MED ORDER — MIDODRINE HCL 5 MG PO TABS
ORAL_TABLET | ORAL | Status: AC
  Filled 2017-07-18: qty 1

## 2017-07-18 MED ORDER — DOXERCALCIFEROL 4 MCG/2ML IV SOLN
INTRAVENOUS | Status: AC
  Filled 2017-07-18: qty 2

## 2017-07-18 MED ORDER — WARFARIN - PHARMACIST DOSING INPATIENT: Freq: Every day | Status: DC

## 2017-07-18 MED ORDER — HEPARIN SODIUM (PORCINE) 1000 UNIT/ML DIALYSIS
40.0000 [IU]/kg | INTRAMUSCULAR | Status: DC | PRN
  Administered 2017-07-20: 4600 [IU] via INTRAVENOUS_CENTRAL

## 2017-07-18 MED ORDER — ZOLPIDEM TARTRATE 5 MG PO TABS
5.0000 mg | ORAL_TABLET | Freq: Every evening | ORAL | Status: DC | PRN
  Administered 2017-07-18 – 2017-07-20 (×3): 5 mg via ORAL
  Filled 2017-07-18 (×3): qty 1

## 2017-07-18 MED ORDER — WARFARIN SODIUM 2.5 MG PO TABS
2.5000 mg | ORAL_TABLET | Freq: Once | ORAL | Status: AC
  Filled 2017-07-18: qty 1

## 2017-07-18 NOTE — Progress Notes (Signed)
Patient ID: [name], male   DOB: December 10, 1960, 57 y.o.   MRN: [id number] [facility] Progress Note   Assessment/ Plan:   1.  Shortness of breath/volume overload in patient with history of congestive heart failure (EF 30%): Ongoing hemodialysis at this time and reports that already with some ultrafiltration, shortness of breath feels better.  We discussed the utility of possibly increasing his dialysis to 4 days a week in order to prevent recurrent hospitalizations as well as to make an impact on his morbidity/mortality. 2. ESRD: Ongoing hemodialysis at this time per his usual outpatient M/W/F schedule and will place him on schedule again tomorrow for additional ultrafiltration given significant volume sensitivity. 3. Anemia: Hemoglobin level acceptable on ESA, continue to monitor trend and for overt losses. 4. CKD-MBD: Continue phosphorus binders-calcium acetate on hold given his corrected calcium.  Continue Hectorol for PTH suppression.  On Parsabiv as an outpatient. 5. Nutrition: Continue renal diet with renal multivitamin and oral nutritional supplementation. 6. Hypertension: Blood pressure under fair control, hypotension usually limiting to his ultrafiltration goal.  Subjective:   Reports some improvement of shortness of breath-still not at baseline   Objective:   BP 127/87   Pulse 88   Temp 97.6 F (36.4 C) (Oral)   Resp 18   Ht 6\' 1"  (1.854 m)   Wt 115.8 kg (255 lb 4.7 oz)   SpO2 96%   BMI 33.68 kg/m   Physical Exam: Gen: Comfortably resting on hemodialysis, not in distress CVS: Pulse regular rhythm, normal rate, S1 and S2 normal Resp: Fine rales over both bases, no distinct rhonchi Abd: Soft, obese, nontender Ext: No lower extremity edema, left radiocephalic fistula cannulated  Labs: BMET Recent Labs  Lab 07/17/17 0710 07/18/17 0531  NA 134* 135  K 5.4* 4.7  CL 96* 96*  CO2 23 25  GLUCOSE 84 114*  BUN 32* 46*  CREATININE 11.44* 13.73*  CALCIUM 9.4  9.2  PHOS  --  5.8*   CBC Recent Labs  Lab 07/17/17 0710 07/18/17 0531  WBC 5.0 4.8  HGB 11.8* 10.4*  HCT 37.4* 32.8*  MCV 95.4 96.2  PLT 220 185   Medications:    . allopurinol  150 mg Oral QODAY  . amiodarone  100 mg Oral Daily  . doxercalciferol  4 mcg Intravenous Q M,W,F-HD  . feeding supplement (NEPRO CARB STEADY)  240 mL Oral BID  . ferric citrate  210 mg Oral TID WC  . lidocaine-prilocaine  1 application Topical Q M,W,F-HD  . midodrine      . midodrine  5 mg Oral Q M,W,F-HD  . multivitamin with minerals  1 tablet Oral Daily  . pantoprazole  40 mg Oral BID  . sodium chloride flush  3 mL Intravenous Q12H  . sodium chloride flush  3 mL Intravenous Q12H  . warfarin  7.5 mg Oral q1800  . Warfarin - Physician Dosing Inpatient   Does not apply [id number]   Elmarie Shiley, MD 07/18/2017, 9:47 AM

## 2017-07-18 NOTE — Progress Notes (Signed)
I am covering cardmaster role today; there was a request for Advanced HF team to review patient on Monday. Advanced HF team has reviewed chart and do not feel they have anything to offer from advanced therapies perspective. Spoke with IM Dr. Cruzita Lederer about recommendation since if cardiology input is still requested, this is a patient of Dr. Doylene Canard. He plans to touch base with Dr. Doylene Canard further. Dayna Dunn PA-C

## 2017-07-18 NOTE — Progress Notes (Signed)
PROGRESS NOTE  [name] WUX:[id number] DOB: 08-19-1960 DOA: 07/17/2017 PCP: Iona Beard, MD   LOS: 1 day   Brief Narrative / Interim history: 57 yo M with ESRD on HD MWF, PAF, severe combined CHF, DM who was admitted with shortness of breath. He was found to have fluid overload and was admitted to the hospital. His primary cardiologist is Dr. Carlisle Beers. He saw Dr. Haroldine Laws in November 2018 in CHF clinic without many options for treatment. He has been off his HF meds due to hypotension requiring Midodrine.   Assessment & Plan: Principal Problem:   Acute respiratory failure with hypoxemia (HCC) Active Problems:   DM II (diabetes mellitus, type II), controlled (HCC)   End-stage renal disease on hemodialysis (HCC)   Paroxysmal atrial flutter (HCC)   Blind right eye   Acute on chronic combined systolic and diastolic CHF, NYHA class 4 (HCC)    Acute hypoxic respiratory failure -due to volume overload, HD today and at least tomorrow. Hypotension with HF meds limit therapeutic options and patient required Midodrine to tolerate HD.  -wean off oxygen as tolerated  Acute on chronic combined systolic and diastolic CHF -comfortable at rest but short of breath with any sort of activity -volume status managed with HD, ?if possible to get HD 4 times weekly?  DM2 -monitor, controlled  ESRD on HD -nephrology consulted, d/w Dr. Posey Pronto   PAF -currently in sinus, continue Amiodarone, AC with Coumadin   Right eye blindness    DVT prophylaxis: on Coumadin Code Status: Full code Family Communication: no family at bedside Disposition Plan: home when ready   Consultants:   Nephrology   Procedures:  None  Antimicrobials:  None    Subjective: -seen in HD, not short of breath while in bed. Denies chest pain, nausea or vomiting  Objective: Vitals:   07/18/17 1000 07/18/17 1030 07/18/17 1100 07/18/17 1119  BP: 127/87 (!) 131/95 (!) 142/70 (!) 152/79  Pulse: 86 86 85 92  Resp: 18  18 18 18   Temp:    97.6 F (36.4 C)  TempSrc:    Oral  SpO2:    95%  Weight:    112.8 kg (248 lb 10.9 oz)  Height:        Intake/Output Summary (Last 24 hours) at 07/18/2017 1213 Last data filed at 07/18/2017 1119 Gross per 24 hour  Intake 280 ml  Output 3000 ml  Net -2720 ml   Filed Weights   07/17/17 1829 07/18/17 0700 07/18/17 1119  Weight: 114.4 kg (252 lb 3.3 oz) 115.8 kg (255 lb 4.7 oz) 112.8 kg (248 lb 10.9 oz)    Examination:  Constitutional: NAD Eyes: lids and conjunctivae normal ENMT: Mucous membranes are moist. Neck: normal, supple Respiratory: bibasilar crackles, decreased sounds at the bases. No wheezing  Cardiovascular: Regular rate and rhythm, no murmurs / rubs / gallops. No LE edema. Abdomen: no tenderness. Bowel sounds positive.  Skin: no rashes Neurologic: CN 2-12 grossly intact. Strength 5/5 in all 4.  Psychiatric: Normal judgment and insight. Alert and oriented x 3. Normal mood.    Data Reviewed: I have independently reviewed following labs and imaging studies CXR - vascular congestion with fluid overload    CBC: Recent Labs  Lab 07/17/17 0710 07/18/17 0531  WBC 5.0 4.8  HGB 11.8* 10.4*  HCT 37.4* 32.8*  MCV 95.4 96.2  PLT 220 253   Basic Metabolic Panel: Recent Labs  Lab 07/17/17 0710 07/18/17 0531  NA 134* 135  K 5.4* 4.7  CL 96* 96*  CO2 23 25  GLUCOSE 84 114*  BUN 32* 46*  CREATININE 11.44* 13.73*  CALCIUM 9.4 9.2  PHOS  --  5.8*   GFR: Estimated Creatinine Clearance: 8.1 mL/min (A) (by C-G formula based on SCr of 13.73 mg/dL (H)). Liver Function Tests: Recent Labs  Lab 07/18/17 0531  ALBUMIN 3.2*   No results for input(s): LIPASE, AMYLASE in the last 168 hours. No results for input(s): AMMONIA in the last 168 hours. Coagulation Profile: Recent Labs  Lab 07/17/17 0710 07/18/17 0531  INR 2.72 3.16   Cardiac Enzymes: No results for input(s): CKTOTAL, CKMB, CKMBINDEX, TROPONINI in the last 168 hours. BNP (last 3  results) No results for input(s): PROBNP in the last 8760 hours. HbA1C: No results for input(s): HGBA1C in the last 72 hours. CBG: No results for input(s): GLUCAP in the last 168 hours. Lipid Profile: No results for input(s): CHOL, HDL, LDLCALC, TRIG, CHOLHDL, LDLDIRECT in the last 72 hours. Thyroid Function Tests: No results for input(s): TSH, T4TOTAL, FREET4, T3FREE, THYROIDAB in the last 72 hours. Anemia Panel: No results for input(s): VITAMINB12, FOLATE, FERRITIN, TIBC, IRON, RETICCTPCT in the last 72 hours. Urine analysis:    Component Value Date/Time   COLORURINE YELLOW 05/19/2011 [place] 05/19/2011 1042   LABSPEC 1.013 05/19/2011 1042   PHURINE 6.5 05/19/2011 1042   GLUCOSEU NEGATIVE 05/19/2011 1042   HGBUR TRACE (A) 05/19/2011 1042   BILIRUBINUR NEGATIVE 05/19/2011 1042   KETONESUR NEGATIVE 05/19/2011 1042   PROTEINUR >300 (A) 05/19/2011 1042   UROBILINOGEN 0.2 05/19/2011 1042   NITRITE NEGATIVE 05/19/2011 1042   LEUKOCYTESUR NEGATIVE 05/19/2011 1042   Sepsis Labs: Invalid input(s): PROCALCITONIN, LACTICIDVEN  Recent Results (from the past 240 hour(s))  MRSA PCR Screening     Status: None   Collection Time: 07/17/17 10:42 PM  Result Value Ref Range Status   MRSA by PCR NEGATIVE NEGATIVE Final    Comment:        The GeneXpert MRSA Assay (FDA approved for NASAL specimens only), is one component of a comprehensive MRSA colonization surveillance program. It is not intended to diagnose MRSA infection nor to guide or monitor treatment for MRSA infections. Performed at [facility], [place] [address] [place], [place] [address]       Radiology Studies: Dg Chest 2 View  Result Date: 07/17/2017 CLINICAL DATA:  Chest tightness.  Nonproductive cough for 1 week. EXAM: CHEST  2 VIEW COMPARISON:  03/16/2017 FINDINGS: Moderate cardiomegaly. Mild vascular congestion without interstitial edema. No pneumothorax. No pleural effusion. IMPRESSION:  Cardiomegaly and mild vascular congestion without interstitial edema. Electronically Signed   By: Marybelle Killings M.D.   On: 07/17/2017 08:08     Scheduled Meds: . allopurinol  150 mg Oral QODAY  . amiodarone  100 mg Oral Daily  . doxercalciferol  4 mcg Intravenous Q M,W,F-HD  . feeding supplement (NEPRO CARB STEADY)  240 mL Oral BID  . ferric citrate  210 mg Oral TID WC  . lidocaine-prilocaine  1 application Topical Q M,W,F-HD  . midodrine  5 mg Oral Q M,W,F-HD  . multivitamin with minerals  1 tablet Oral Daily  . pantoprazole  40 mg Oral BID  . sodium chloride flush  3 mL Intravenous Q12H  . sodium chloride flush  3 mL Intravenous Q12H  . warfarin  2.5 mg Oral ONCE-1800  . Warfarin - Pharmacist Dosing Inpatient   Does not apply q1800   Continuous Infusions: . sodium  chloride    . [START ON 07/20/2017] ferric gluconate (FERRLECIT/NULECIT) IV       Marzetta Board, MD, PhD [facility] Pager [phone]  If 7PM-7AM, please contact night-coverage [email or web address] Password [facility] 07/18/2017, 12:13 PM

## 2017-07-18 NOTE — Procedures (Signed)
Patient seen on Hemodialysis. QB 400, UF goal 3.5L Treatment adjusted as needed.  Elmarie Shiley MD [facility]. Office # [phone] Pager # [phone] 10:01 AM

## 2017-07-18 NOTE — Progress Notes (Signed)
ANTICOAGULATION CONSULT NOTE - Follow Up Consult  Pharmacy Consult for Warfarin Indication: atrial fibrillation  Allergies  Allergen Reactions  . Ace Inhibitors Itching and Cough    Patient Measurements: Height: 6\' 1"  (185.4 cm) Weight: 248 lb 10.9 oz (112.8 kg) IBW/kg (Calculated) : 79.9  Vital Signs: Temp: 97.6 F (36.4 C) (02/25 1119) Temp Source: Oral (02/25 1119) BP: 152/79 (02/25 1119) Pulse Rate: 92 (02/25 1119)  Labs: Recent Labs    07/17/17 0710 07/18/17 0531  HGB 11.8* 10.4*  HCT 37.4* 32.8*  PLT 220 185  LABPROT 28.7* 32.2*  INR 2.72 3.16  CREATININE 11.44* 13.73*    Estimated Creatinine Clearance: 8.1 mL/min (A) (by C-G formula based on SCr of 13.73 mg/dL (H)).   Assessment: 57 year old male on warfarin prior to admission for Afib INR today = 3.16 Dose prior to admission = 15 mg po daily  Goal of Therapy:  INR 2-3 Monitor platelets by anticoagulation protocol: Yes   Plan:  Warfarin 2.5 mg po x 1 dose tonight Daily INR  Thank you Anette Guarneri, PharmD [phone]  07/18/2017,11:27 AM

## 2017-07-19 DIAGNOSIS — H5440 Blindness, one eye, unspecified eye: Secondary | ICD-10-CM

## 2017-07-19 LAB — PROTIME-INR
INR: 2.22
Prothrombin Time: 24.4 seconds — ABNORMAL HIGH (ref 11.4–15.2)

## 2017-07-19 LAB — BASIC METABOLIC PANEL
Anion gap: 14 (ref 5–15)
BUN: 35 mg/dL — ABNORMAL HIGH (ref 6–20)
CO2: 24 mmol/L (ref 22–32)
Calcium: 9.4 mg/dL (ref 8.9–10.3)
Chloride: 97 mmol/L — ABNORMAL LOW (ref 101–111)
Creatinine, Ser: 10.77 mg/dL — ABNORMAL HIGH (ref 0.61–1.24)
GFR calc Af Amer: 5 mL/min — ABNORMAL LOW (ref >60)
GFR calc non Af Amer: 5 mL/min — ABNORMAL LOW (ref >60)
Glucose, Bld: 70 mg/dL (ref 65–99)
Potassium: 4.4 mmol/L (ref 3.5–5.1)
Sodium: 135 mmol/L (ref 135–145)

## 2017-07-19 MED ORDER — WARFARIN SODIUM 10 MG PO TABS: 10.0000 mg | ORAL_TABLET | Freq: Once | ORAL | Status: AC

## 2017-07-19 NOTE — Care Management Note (Addendum)
Case Management Note  Patient Details  Name: [name] MRN: [id number] Date of Birth: [birth year]  Subjective/Objective:   History of ESRD on dialysis admitted for Acute respiratory failure with hypoxia              Action/Plan: In to speak with patient.  Prior to admission patient lived at home with wife.  PCP is Iona Beard.  Uses [facility] on [address]. In [place], [place]. Patient wife does most of the cooking and they both cook with no salt.  Wife does the shopping. Patient normally drives himself to medical appointments but wife is available if needed.  Will be returning to the same living situation after discharge.  Home DME: none.  Patient has scale at home and weighs daily.  Prior to admission patient independent of all ADL's.  At discharge, patient has transportation home. Patient has the ability to pay for your medication/food.  Patient has never used [facility] before.  Expected Discharge Date:  07/20/17               Discharge planning Services   CM  Status of Service:    In progress, will continue to follow  Kristen Cardinal, RN  Nurse case [place] 07/19/2017, 4:43 PM

## 2017-07-19 NOTE — Progress Notes (Signed)
Subjective:  Sitting in chair, no cos,  For extra hd and vol uf today , Does not think he can do 4 HD days as Op    Objective Vital signs in last 24 hours: Vitals:   07/18/17 1642 07/18/17 2101 07/19/17 0443 07/19/17 0937  BP: 138/88 126/87 (!) 144/95 (!) 133/99  Pulse: 88 96 (!) 102 96  Resp: 18 18 18    Temp: 98.3 F (36.8 C) 98.5 F (36.9 C) 98 F (36.7 C) 97.7 F (36.5 C)  TempSrc: Oral Oral Oral Oral  SpO2: 100% 99% 100% 100%  Weight:  112.8 kg (248 lb 10.9 oz)    Height:       Weight change: -1.6 kg (-8.4 oz)  Physical Exam: General: Alert  nad comfortabel Heart: RRR soft 1/6 sem , no rub or gallop Lungs: CTA , Abdomen: Soft , obese, NT,ND Extremities: No pedal edema   Dialysis Access: pos bruit L FA AVF    OP HD =Center: [facility]  on MWF .Uf profile 4 EDW 113.5  HD Bath 2k, 2.25ca  Time 4hr Heparin 7000. Access LFA AVF  Meds = Hec 4 mcg qhd  ,Parsabiv  12.5 q hd, Venofer 50 mg q wed hd   Problem/Plan: 1. Sob with Hypoxia/ volume overload /with HO CM  Ef 30%= hd today plan for uf AND HD in am Normal  Schedule / Dw him increasing Midodrine Currently 5 mg tid nonhd  And 10mg  tid on HD = increase  HS pre hd 10mg  / needs lower edw  2. ESRD -  HD MWF  Schedule  3.  Hypotension/volume  - BP up on admit with ^ vol (also on Midodrine ) plan as in 1  / Uf with hd and notes  Midodrine,needs lower edw/  4. Anemia  - hgb 11.9>10.4   No esa needs and on weekly venofer q wed hd  5. Metabolic bone disease - Hec on hd , (parsabiv not avail)Auryxia and calcium acetate binders listed  Ca 9.4 (hold calcium acetate in hosp) phos 5.8 yest . 6. HO A.Flutter = Sinus on my exam /tele/  On Coumadin  /Amiodarone  7. DM type 2 - per admit   Ernest Haber, PA-C [facility] [phone] 07/19/2017,10:13 AM  LOS: 2 days   Labs: Basic Metabolic Panel: Recent Labs  Lab 07/17/17 0710 07/18/17 0531  NA 134* 135  K 5.4* 4.7  CL 96* 96*  CO2 23 25  GLUCOSE 84 114*  BUN 32*  46*  CREATININE 11.44* 13.73*  CALCIUM 9.4 9.2  PHOS  --  5.8*   Liver Function Tests: Recent Labs  Lab 07/18/17 0531  ALBUMIN 3.2*   No results for input(s): LIPASE, AMYLASE in the last 168 hours. No results for input(s): AMMONIA in the last 168 hours. CBC: Recent Labs  Lab 07/17/17 0710 07/18/17 0531  WBC 5.0 4.8  HGB 11.8* 10.4*  HCT 37.4* 32.8*  MCV 95.4 96.2  PLT 220 185   Cardiac Enzymes: No results for input(s): CKTOTAL, CKMB, CKMBINDEX, TROPONINI in the last 168 hours. CBG: No results for input(s): GLUCAP in the last 168 hours.  Studies/Results: No results found. Medications: . sodium chloride    . [START ON 07/20/2017] ferric gluconate (FERRLECIT/NULECIT) IV     . allopurinol  150 mg Oral QODAY  . amiodarone  100 mg Oral Daily  . doxercalciferol  4 mcg Intravenous Q M,W,F-HD  . feeding supplement (NEPRO CARB STEADY)  240 mL Oral BID  .  ferric citrate  210 mg Oral TID WC  . lidocaine-prilocaine  1 application Topical Q M,W,F-HD  . midodrine  5 mg Oral Q M,W,F-HD  . multivitamin with minerals  1 tablet Oral Daily  . pantoprazole  40 mg Oral BID  . sodium chloride flush  3 mL Intravenous Q12H  . sodium chloride flush  3 mL Intravenous Q12H  . warfarin  2.5 mg Oral ONCE-1800  . Warfarin - Pharmacist Dosing Inpatient   Does not apply [phone]

## 2017-07-19 NOTE — Progress Notes (Signed)
ANTICOAGULATION CONSULT NOTE - Follow Up Consult  Pharmacy Consult for Warfarin Indication: atrial fibrillation  Allergies  Allergen Reactions  . Ace Inhibitors Itching and Cough    Patient Measurements: Height: 6\' 1"  (185.4 cm) Weight: 248 lb 10.9 oz (112.8 kg) IBW/kg (Calculated) : 79.9  Vital Signs: Temp: 97.7 F (36.5 C) (02/26 0937) Temp Source: Oral (02/26 0937) BP: 133/99 (02/26 0937) Pulse Rate: 96 (02/26 0937)  Labs: Recent Labs    07/17/17 0710 07/18/17 0531 07/19/17 0951  HGB 11.8* 10.4*  --   HCT 37.4* 32.8*  --   PLT 220 185  --   LABPROT 28.7* 32.2* 24.4*  INR 2.72 3.16 2.22  CREATININE 11.44* 13.73* 10.77*    Estimated Creatinine Clearance: 10.3 mL/min (A) (by C-G formula based on SCr of 10.77 mg/dL (H)).   Assessment: 57 year old male on warfarin prior to admission for Afib INR today = 3.16 - > 2.22 Dose not charted 2/25 Dose prior to admission = 15 mg po daily  Goal of Therapy:  INR 2-3 Monitor platelets by anticoagulation protocol: Yes   Plan:  Warfarin 10 mg po x 1 dose tonight Daily INR  Thank you Anette Guarneri, PharmD [phone]  07/19/2017,12:26 PM

## 2017-07-19 NOTE — Progress Notes (Signed)
PROGRESS NOTE  Tabitha Tupper AST:[id number] DOB: 08-13-1960 DOA: 07/17/2017 PCP: Iona Beard, MD   LOS: 2 days   Brief Narrative / Interim history: 57 yo M with ESRD on HD MWF, PAF, severe combined CHF, DM who was admitted with shortness of breath. He was found to have fluid overload and was admitted to the hospital. His primary cardiologist is Dr. Carlisle Beers. He saw Dr. Haroldine Laws in November 2018 in CHF clinic without many options for treatment. He has been off his HF meds due to hypotension requiring Midodrine.   Assessment & Plan: Principal Problem:   Acute respiratory failure with hypoxemia (HCC) Active Problems:   DM II (diabetes mellitus, type II), controlled (HCC)   End-stage renal disease on hemodialysis (HCC)   Paroxysmal atrial flutter (HCC)   Blind right eye   Acute on chronic combined systolic and diastolic CHF, NYHA class 4 (HCC)    Acute hypoxic respiratory failure -due to volume overload, HD on 2/25 as well as today on 2/26. -Nephrology is discussing with patient regarding outpatient hemodialysis for 4 days a week instead of 3, he is still thinking about this -wean off oxygen as tolerated  Acute on chronic combined systolic and diastolic CHF -comfortable at rest but short of breath with any sort of activity -volume status managed with HD, ?if possible to get HD 4 times weekly? -Finally comfortable at rest on room air this morning however still gets quite dyspneic when he ambulates in the room -cardiology to see, patient himself called Dr. Doylene Canard -He was evaluated by CHF clinic   DM2 -monitor, controlled  ESRD on HD -nephrology consulted  PAF -currently in sinus, continue Amiodarone, AC with Coumadin   Right eye blindness    DVT prophylaxis: on Coumadin Code Status: Full code Family Communication: no family at bedside Disposition Plan: home when ready   Consultants:   Nephrology   Procedures:  None  Antimicrobials:  None    Subjective: -Appears  more comfortable today sitting in chair.  On room air.  Denies any chest pain, denies any palpitations.  Was able to ambulate to the bathroom with mild shortness of breath  Objective: Vitals:   07/18/17 1642 07/18/17 2101 07/19/17 0443 07/19/17 0937  BP: 138/88 126/87 (!) 144/95 (!) 133/99  Pulse: 88 96 (!) 102 96  Resp: 18 18 18    Temp: 98.3 F (36.8 C) 98.5 F (36.9 C) 98 F (36.7 C) 97.7 F (36.5 C)  TempSrc: Oral Oral Oral Oral  SpO2: 100% 99% 100% 100%  Weight:  112.8 kg (248 lb 10.9 oz)    Height:        Intake/Output Summary (Last 24 hours) at 07/19/2017 1203 Last data filed at 07/19/2017 0600 Gross per 24 hour  Intake 660 ml  Output 0 ml  Net 660 ml   Filed Weights   07/18/17 0700 07/18/17 1119 07/18/17 2101  Weight: 115.8 kg (255 lb 4.7 oz) 112.8 kg (248 lb 10.9 oz) 112.8 kg (248 lb 10.9 oz)    Examination:  Constitutional: No distress Eyes: No scleral icterus ENMT: mmm Respiratory: Clear to auscultation bilaterally, no wheezing, no crackles.  Moves air well, no accessory muscle use Cardiovascular: Regular rate and rhythm without murmurs.  No edema. Abdomen: Soft, nontender, nondistended.  Positive bowel sounds Skin: No rashes seen Neurologic: Nonfocal, ambulatory Psychiatric: Normal judgment and insight. Alert and oriented x 3. Normal mood.    Data Reviewed: I have independently reviewed following labs and imaging studies CXR - vascular congestion with  fluid overload    CBC: Recent Labs  Lab 07/17/17 0710 07/18/17 0531  WBC 5.0 4.8  HGB 11.8* 10.4*  HCT 37.4* 32.8*  MCV 95.4 96.2  PLT 220 782   Basic Metabolic Panel: Recent Labs  Lab 07/17/17 0710 07/18/17 0531 07/19/17 0951  NA 134* 135 135  K 5.4* 4.7 4.4  CL 96* 96* 97*  CO2 23 25 24   GLUCOSE 84 114* 70  BUN 32* 46* 35*  CREATININE 11.44* 13.73* 10.77*  CALCIUM 9.4 9.2 9.4  PHOS  --  5.8*  --    GFR: Estimated Creatinine Clearance: 10.3 mL/min (A) (by C-G formula based on SCr of  10.77 mg/dL (H)). Liver Function Tests: Recent Labs  Lab 07/18/17 0531  ALBUMIN 3.2*   No results for input(s): LIPASE, AMYLASE in the last 168 hours. No results for input(s): AMMONIA in the last 168 hours. Coagulation Profile: Recent Labs  Lab 07/17/17 0710 07/18/17 0531 07/19/17 0951  INR 2.72 3.16 2.22   Cardiac Enzymes: No results for input(s): CKTOTAL, CKMB, CKMBINDEX, TROPONINI in the last 168 hours. BNP (last 3 results) No results for input(s): PROBNP in the last 8760 hours. HbA1C: No results for input(s): HGBA1C in the last 72 hours. CBG: No results for input(s): GLUCAP in the last 168 hours. Lipid Profile: No results for input(s): CHOL, HDL, LDLCALC, TRIG, CHOLHDL, LDLDIRECT in the last 72 hours. Thyroid Function Tests: No results for input(s): TSH, T4TOTAL, FREET4, T3FREE, THYROIDAB in the last 72 hours. Anemia Panel: No results for input(s): VITAMINB12, FOLATE, FERRITIN, TIBC, IRON, RETICCTPCT in the last 72 hours. Urine analysis:    Component Value Date/Time   COLORURINE YELLOW 05/19/2011 [place] 05/19/2011 1042   LABSPEC 1.013 05/19/2011 1042   PHURINE 6.5 05/19/2011 1042   GLUCOSEU NEGATIVE 05/19/2011 1042   HGBUR TRACE (A) 05/19/2011 1042   BILIRUBINUR NEGATIVE 05/19/2011 1042   KETONESUR NEGATIVE 05/19/2011 1042   PROTEINUR >300 (A) 05/19/2011 1042   UROBILINOGEN 0.2 05/19/2011 1042   NITRITE NEGATIVE 05/19/2011 1042   LEUKOCYTESUR NEGATIVE 05/19/2011 1042   Sepsis Labs: Invalid input(s): PROCALCITONIN, LACTICIDVEN  Recent Results (from the past 240 hour(s))  MRSA PCR Screening     Status: None   Collection Time: 07/17/17 10:42 PM  Result Value Ref Range Status   MRSA by PCR NEGATIVE NEGATIVE Final    Comment:        The GeneXpert MRSA Assay (FDA approved for NASAL specimens only), is one component of a comprehensive MRSA colonization surveillance program. It is not intended to diagnose MRSA infection nor to guide  or monitor treatment for MRSA infections. Performed at [facility], [place] [address], [place], [place] [address]       Radiology Studies: No results found.   Scheduled Meds: . allopurinol  150 mg Oral QODAY  . amiodarone  100 mg Oral Daily  . doxercalciferol  4 mcg Intravenous Q M,W,F-HD  . feeding supplement (NEPRO CARB STEADY)  240 mL Oral BID  . ferric citrate  210 mg Oral TID WC  . lidocaine-prilocaine  1 application Topical Q M,W,F-HD  . midodrine  5 mg Oral Q M,W,F-HD  . multivitamin with minerals  1 tablet Oral Daily  . pantoprazole  40 mg Oral BID  . sodium chloride flush  3 mL Intravenous Q12H  . sodium chloride flush  3 mL Intravenous Q12H  . warfarin  2.5 mg Oral ONCE-1800  . Warfarin - Pharmacist Dosing Inpatient   Does  not apply q1800   Continuous Infusions: . sodium chloride    . [START ON 07/20/2017] ferric gluconate (FERRLECIT/NULECIT) IV       Marzetta Board, MD, PhD [facility] Pager [phone]  If 7PM-7AM, please contact night-coverage [email or web address] Password [facility] 07/19/2017, 12:03 PM

## 2017-07-20 DIAGNOSIS — J9601 Acute respiratory failure with hypoxia: Secondary | ICD-10-CM

## 2017-07-20 LAB — CBC
HCT: 30.3 % — ABNORMAL LOW (ref 39.0–52.0)
Hemoglobin: 10 g/dL — ABNORMAL LOW (ref 13.0–17.0)
MCH: 31.2 pg (ref 26.0–34.0)
MCHC: 33 g/dL (ref 30.0–36.0)
MCV: 94.4 fL (ref 78.0–100.0)
Platelets: 180 10*3/uL (ref 150–400)
RBC: 3.21 MIL/uL — ABNORMAL LOW (ref 4.22–5.81)
RDW: 15.2 % (ref 11.5–15.5)
WBC: 3.8 10*3/uL — ABNORMAL LOW (ref 4.0–10.5)

## 2017-07-20 LAB — RENAL FUNCTION PANEL
Albumin: 3.1 g/dL — ABNORMAL LOW (ref 3.5–5.0)
Anion gap: 13 (ref 5–15)
BUN: 47 mg/dL — ABNORMAL HIGH (ref 6–20)
CO2: 25 mmol/L (ref 22–32)
Calcium: 9.2 mg/dL (ref 8.9–10.3)
Chloride: 96 mmol/L — ABNORMAL LOW (ref 101–111)
Creatinine, Ser: 12.65 mg/dL — ABNORMAL HIGH (ref 0.61–1.24)
GFR calc Af Amer: 4 mL/min — ABNORMAL LOW (ref >60)
GFR calc non Af Amer: 4 mL/min — ABNORMAL LOW (ref >60)
Glucose, Bld: 98 mg/dL (ref 65–99)
Phosphorus: 6.3 mg/dL — ABNORMAL HIGH (ref 2.5–4.6)
Potassium: 4.3 mmol/L (ref 3.5–5.1)
Sodium: 134 mmol/L — ABNORMAL LOW (ref 135–145)

## 2017-07-20 LAB — BASIC METABOLIC PANEL
Anion gap: 14 (ref 5–15)
BUN: 47 mg/dL — ABNORMAL HIGH (ref 6–20)
CO2: 24 mmol/L (ref 22–32)
Calcium: 9.2 mg/dL (ref 8.9–10.3)
Chloride: 96 mmol/L — ABNORMAL LOW (ref 101–111)
Creatinine, Ser: 12.73 mg/dL — ABNORMAL HIGH (ref 0.61–1.24)
GFR calc Af Amer: 4 mL/min — ABNORMAL LOW (ref >60)
GFR calc non Af Amer: 4 mL/min — ABNORMAL LOW (ref >60)
Glucose, Bld: 98 mg/dL (ref 65–99)
Potassium: 4.3 mmol/L (ref 3.5–5.1)
Sodium: 134 mmol/L — ABNORMAL LOW (ref 135–145)

## 2017-07-20 LAB — PROTIME-INR
INR: 1.89
Prothrombin Time: 21.6 seconds — ABNORMAL HIGH (ref 11.4–15.2)

## 2017-07-20 MED ORDER — DARBEPOETIN ALFA 40 MCG/0.4ML IJ SOSY
PREFILLED_SYRINGE | INTRAMUSCULAR | Status: AC
  Administered 2017-07-20: 40 ug via INTRAVENOUS
  Filled 2017-07-20: qty 0.4

## 2017-07-20 MED ORDER — DOXERCALCIFEROL 4 MCG/2ML IV SOLN
INTRAVENOUS | Status: AC
  Administered 2017-07-20: 4 ug via INTRAVENOUS
  Filled 2017-07-20: qty 2

## 2017-07-20 MED ORDER — FERRIC CITRATE 1 GM 210 MG(FE) PO TABS
420.0000 mg | ORAL_TABLET | Freq: Three times a day (TID) | ORAL | Status: DC
  Administered 2017-07-20 – 2017-07-21 (×3): 420 mg via ORAL
  Filled 2017-07-20 (×6): qty 2

## 2017-07-20 MED ORDER — MIDODRINE HCL 5 MG PO TABS
5.0000 mg | ORAL_TABLET | Freq: Once | ORAL | Status: AC
  Administered 2017-07-20: 5 mg via ORAL

## 2017-07-20 MED ORDER — WARFARIN SODIUM 7.5 MG PO TABS
15.0000 mg | ORAL_TABLET | Freq: Once | ORAL | Status: AC
  Administered 2017-07-20: 15 mg via ORAL
  Filled 2017-07-20: qty 2

## 2017-07-20 MED ORDER — HEPARIN SODIUM (PORCINE) 1000 UNIT/ML DIALYSIS
7000.0000 [IU] | Freq: Once | INTRAMUSCULAR | Status: AC
  Administered 2017-07-20: 7000 [IU] via INTRAVENOUS_CENTRAL

## 2017-07-20 MED ORDER — DARBEPOETIN ALFA 40 MCG/0.4ML IJ SOSY
40.0000 ug | PREFILLED_SYRINGE | INTRAMUSCULAR | Status: DC
  Administered 2017-07-20: 40 ug via INTRAVENOUS
  Filled 2017-07-20: qty 0.4

## 2017-07-20 MED ORDER — MIDODRINE HCL 5 MG PO TABS
ORAL_TABLET | ORAL | Status: AC
  Administered 2017-07-20: 5 mg via ORAL
  Filled 2017-07-20: qty 1

## 2017-07-20 NOTE — Progress Notes (Signed)
HD tx initiated via 15Gx2 w/o problem, pull/push/flush well, VSS, will cont to monitor while on HD tx 

## 2017-07-20 NOTE — Progress Notes (Signed)
Subjective:   No cos in bedside chair /tolerated HD last pm  2.5 l uf  ( 112.3 post wt  Old edw 113.5 ) ,today is his normal hd day =agrees to hd later today / also thinks he is agreeable to increased op hd 4 times /wk  Objective Vital signs in last 24 hours: Vitals:   07/20/17 0600 07/20/17 0630 07/20/17 0650 07/20/17 0756  BP: 126/84 121/82 (!) 124/91 122/88  Pulse: 91 89 88 84  Resp: (!) 21  (!) 21 18  Temp:   98.4 F (36.9 C) 98.3 F (36.8 C)  TempSrc:   Oral Oral  SpO2: 99%  100% 99%  Weight:   112.3 kg (247 lb 9.2 oz)   Height:       Weight change: 2.1 kg (4 lb 10.1 oz)  Physical Exam: General: Alert, Pleasant ,NAD  Heart: RRR soft 1/6 sem , no rub or gallop Lungs: CTA , Abdomen: Soft , obese, NT,ND Extremities: No pedal edema   Dialysis Access: pos bruit L FA AVF    OP HD =Center:[facility] MWF.Uf profile 4 EDW113.5HD Bath 2k, 2.25caTime 4hrHeparin 7000. AccessLFA AVF  Meds = Hec 4 mcg qhd ,Parsabiv 12.5 q hd, Venofer 50 mg q wed hd   Problem/Plan: 1. Sob with Hypoxia/ volume overload /with HO CM  Ef 30%= hd last pm for uf today hd is .Normal  Schedule /Dw him  plan =4 times a week  Hd op sec  CM, he is more agreeable today , yest increased Midodrine / lower edw at dc  2. ESRD -HD MWF Schedule currently / 3. Hypotension/volume - BP up on admit with ^ vol (also on Midodrine ) plan as in 1  / Uf with hd and notes  Midodrine,needs lower edw/ 4. Anemia - hgb 11.9>10.4>10.0   will start Aranesp 40 today  With dropping and on weekly venofer q wed hd  5. Metabolic bone disease -Hec on hd , (parsabiv not avail)Auryxia and calcium acetate binders listed ^ Corec Ca (hold calcium acetate in hosp) phos 6.3 incr  Auryxia 2ac. 6. HO A.Flutter = Sinus on my exam /tele/ On Coumadin /Amiodarone  7. DM type 2 - per admit   Ernest Haber, PA-C [place] [phone] 07/20/2017,9:54 AM  LOS: 3 days   Labs: Basic Metabolic Panel: Recent  Labs  Lab 07/18/17 0531 07/19/17 0951 07/20/17 0214  NA 135 135 134*  134*  K 4.7 4.4 4.3  4.3  CL 96* 97* 96*  96*  CO2 25 24 24  25   GLUCOSE 114* 70 98  98  BUN 46* 35* 47*  47*  CREATININE 13.73* 10.77* 12.73*  12.65*  CALCIUM 9.2 9.4 9.2  9.2  PHOS 5.8*  --  6.3*   Liver Function Tests: Recent Labs  Lab 07/18/17 0531 07/20/17 0214  ALBUMIN 3.2* 3.1*   No results for input(s): LIPASE, AMYLASE in the last 168 hours. No results for input(s): AMMONIA in the last 168 hours. CBC: Recent Labs  Lab 07/17/17 0710 07/18/17 0531 07/20/17 0214  WBC 5.0 4.8 3.8*  HGB 11.8* 10.4* 10.0*  HCT 37.4* 32.8* 30.3*  MCV 95.4 96.2 94.4  PLT 220 185 180   Cardiac Enzymes: No results for input(s): CKTOTAL, CKMB, CKMBINDEX, TROPONINI in the last 168 hours. CBG: No results for input(s): GLUCAP in the last 168 hours.  Studies/Results: No results found. Medications: . sodium chloride    . ferric gluconate (FERRLECIT/NULECIT) IV     .  allopurinol  150 mg Oral QODAY  . amiodarone  100 mg Oral Daily  . doxercalciferol  4 mcg Intravenous Q M,W,F-HD  . feeding supplement (NEPRO CARB STEADY)  240 mL Oral BID  . ferric citrate  420 mg Oral TID WC  . lidocaine-prilocaine  1 application Topical Q M,W,F-HD  . midodrine  5 mg Oral Q M,W,F-HD  . multivitamin with minerals  1 tablet Oral Daily  . pantoprazole  40 mg Oral BID  . sodium chloride flush  3 mL Intravenous Q12H  . sodium chloride flush  3 mL Intravenous Q12H  . warfarin  10 mg Oral ONCE-1800  . warfarin  15 mg Oral ONCE-1800  . Warfarin - Pharmacist Dosing Inpatient   Does not apply [phone]

## 2017-07-20 NOTE — Progress Notes (Signed)
ANTICOAGULATION CONSULT NOTE - Follow Up Consult  Pharmacy Consult for Warfarin Indication: atrial fibrillation  Allergies  Allergen Reactions  . Ace Inhibitors Itching and Cough    Patient Measurements: Height: 6\' 1"  (185.4 cm) Weight: 247 lb 9.2 oz (112.3 kg) IBW/kg (Calculated) : 79.9  Vital Signs: Temp: 98.3 F (36.8 C) (02/27 0756) Temp Source: Oral (02/27 0756) BP: 122/88 (02/27 0756) Pulse Rate: 84 (02/27 0756)  Labs: Recent Labs    07/18/17 0531 07/19/17 0951 07/20/17 0214  HGB 10.4*  --  10.0*  HCT 32.8*  --  30.3*  PLT 185  --  180  LABPROT 32.2* 24.4* 21.6*  INR 3.16 2.22 1.89  CREATININE 13.73* 10.77* 12.73*  12.65*    Estimated Creatinine Clearance: 8.7 mL/min (A) (by C-G formula based on SCr of 12.73 mg/dL (H)).   Assessment: 57 year old male on warfarin prior to admission for Afib INR today = 3.16 - > 2.22 >1.89 Dose not charted 2/25 or 2/26 Dose prior to admission = 15 mg po daily  Goal of Therapy:  INR 2-3 Monitor platelets by anticoagulation protocol: Yes   Plan:  Warfarin 15 mg po x 1 dose tonight Daily INR  Thank you Anette Guarneri, PharmD [phone]  07/20/2017,9:49 AM

## 2017-07-20 NOTE — Progress Notes (Signed)
Patient is asking about his Renvela medication. It is not present on his MAR. RN notified on call Nephrology, Augustin Coupe. Awaiting call back.  Ermalinda Memos, RN

## 2017-07-20 NOTE — Progress Notes (Signed)
PROGRESS NOTE  [name] JKK:[id number] DOB: October 25, 1960 DOA: 07/17/2017 PCP: Iona Beard, MD   LOS: 3 days   Brief Narrative / Interim history: 57 yo M with ESRD on HD MWF, PAF, severe combined CHF, DM who was admitted with shortness of breath. He was found to have fluid overload and was admitted to the hospital. His primary cardiologist is Dr. Carlisle Beers. He saw Dr. Haroldine Laws in November 2018 in CHF clinic without many options for treatment. He has been off his HF meds due to hypotension requiring Midodrine.   Assessment & Plan: Principal Problem:   Acute respiratory failure with hypoxemia (HCC) Active Problems:   DM II (diabetes mellitus, type II), controlled (HCC)   End-stage renal disease on hemodialysis (HCC)   Paroxysmal atrial flutter (HCC)   Blind right eye   Acute on chronic combined systolic and diastolic CHF, NYHA class 4 ([place])    Acute hypoxic respiratory failure -due to volume overload, HD on 2/25 ,  2/26 and today 2-27 -Nephrology is discussing with patient regarding outpatient hemodialysis for 4 days a week instead of 3.  -wean off oxygen as tolerated -influenza negative.   Acute on chronic combined systolic and diastolic CHF -comfortable at rest but short of breath with any sort of activity -cardiology to see, patient himself called Dr. Doylene Canard -continue with HD -He was evaluated by CHF clinic   Hypotension; during HD. on midodrine.   Anemia; on aranesp, ferric gluconate.  Hb stable at 10.   DM2 -monitor, controlled  ESRD on HD -nephrology following.   PAF -currently in sinus, continue Amiodarone, AC with Coumadin   Right eye blindness    DVT prophylaxis: on Coumadin Code Status: Full code Family Communication: no family at bedside Disposition Plan: home when ready   Consultants:   Nephrology   Procedures:  None  Antimicrobials:  None    Subjective: He is breathing better, dyspnea has improved.   Objective: Vitals:   07/20/17 0600  07/20/17 0630 07/20/17 0650 07/20/17 0756  BP: 126/84 121/82 (!) 124/91 122/88  Pulse: 91 89 88 84  Resp: (!) 21  (!) 21 18  Temp:   98.4 F (36.9 C) 98.3 F (36.8 C)  TempSrc:   Oral Oral  SpO2: 99%  100% 99%  Weight:   112.3 kg (247 lb 9.2 oz)   Height:        Intake/Output Summary (Last 24 hours) at 07/20/2017 1401 Last data filed at 07/20/2017 0843 Gross per 24 hour  Intake 483 ml  Output 2500 ml  Net -2017 ml   Filed Weights   07/18/17 2101 07/20/17 0241 07/20/17 0650  Weight: 112.8 kg (248 lb 10.9 oz) 114.9 kg (253 lb 4.9 oz) 112.3 kg (247 lb 9.2 oz)    Examination:  Constitutional: alert, ion no acute distress,.  ENMT: MMM Respiratory: Normal respiratory effort, crakles bases.  Cardiovascular: S 1, S 2 RRR Abdomen: BS present, soft, nt Skin: no rashes.  Neurologic: non focal.    Data Reviewed: I have independently reviewed following labs and imaging studies CXR - vascular congestion with fluid overload    CBC: Recent Labs  Lab 07/17/17 0710 07/18/17 0531 07/20/17 0214  WBC 5.0 4.8 3.8*  HGB 11.8* 10.4* 10.0*  HCT 37.4* 32.8* 30.3*  MCV 95.4 96.2 94.4  PLT 220 185 716   Basic Metabolic Panel: Recent Labs  Lab 07/17/17 0710 07/18/17 0531 07/19/17 0951 07/20/17 0214  NA 134* 135 135 134*  134*  K 5.4* 4.7 4.4  4.3  4.3  CL 96* 96* 97* 96*  96*  CO2 23 25 24 24  25   GLUCOSE 84 114* 70 98  98  BUN 32* 46* 35* 47*  47*  CREATININE 11.44* 13.73* 10.77* 12.73*  12.65*  CALCIUM 9.4 9.2 9.4 9.2  9.2  PHOS  --  5.8*  --  6.3*   GFR: Estimated Creatinine Clearance: 8.7 mL/min (A) (by C-G formula based on SCr of 12.73 mg/dL (H)). Liver Function Tests: Recent Labs  Lab 07/18/17 0531 07/20/17 0214  ALBUMIN 3.2* 3.1*   No results for input(s): LIPASE, AMYLASE in the last 168 hours. No results for input(s): AMMONIA in the last 168 hours. Coagulation Profile: Recent Labs  Lab 07/17/17 0710 07/18/17 0531 07/19/17 0951 07/20/17 0214  INR  2.72 3.16 2.22 1.89   Cardiac Enzymes: No results for input(s): CKTOTAL, CKMB, CKMBINDEX, TROPONINI in the last 168 hours. BNP (last 3 results) No results for input(s): PROBNP in the last 8760 hours. HbA1C: No results for input(s): HGBA1C in the last 72 hours. CBG: No results for input(s): GLUCAP in the last 168 hours. Lipid Profile: No results for input(s): CHOL, HDL, LDLCALC, TRIG, CHOLHDL, LDLDIRECT in the last 72 hours. Thyroid Function Tests: No results for input(s): TSH, T4TOTAL, FREET4, T3FREE, THYROIDAB in the last 72 hours. Anemia Panel: No results for input(s): VITAMINB12, FOLATE, FERRITIN, TIBC, IRON, RETICCTPCT in the last 72 hours. Urine analysis:    Component Value Date/Time   COLORURINE YELLOW 05/19/2011 [place] 05/19/2011 1042   LABSPEC 1.013 05/19/2011 1042   PHURINE 6.5 05/19/2011 1042   GLUCOSEU NEGATIVE 05/19/2011 1042   HGBUR TRACE (A) 05/19/2011 1042   BILIRUBINUR NEGATIVE 05/19/2011 1042   KETONESUR NEGATIVE 05/19/2011 1042   PROTEINUR >300 (A) 05/19/2011 1042   UROBILINOGEN 0.2 05/19/2011 1042   NITRITE NEGATIVE 05/19/2011 1042   LEUKOCYTESUR NEGATIVE 05/19/2011 1042   Sepsis Labs: Invalid input(s): PROCALCITONIN, LACTICIDVEN  Recent Results (from the past 240 hour(s))  MRSA PCR Screening     Status: None   Collection Time: 07/17/17 10:42 PM  Result Value Ref Range Status   MRSA by PCR NEGATIVE NEGATIVE Final    Comment:        The GeneXpert MRSA Assay (FDA approved for NASAL specimens only), is one component of a comprehensive MRSA colonization surveillance program. It is not intended to diagnose MRSA infection nor to guide or monitor treatment for MRSA infections. Performed at [facility], [place] [address] [place], [place] [address]       Radiology Studies: No results found.   Scheduled Meds: . allopurinol  150 mg Oral QODAY  . amiodarone  100 mg Oral Daily  . darbepoetin (ARANESP) injection - DIALYSIS  40  mcg Intravenous Q Wed-HD  . doxercalciferol  4 mcg Intravenous Q M,W,F-HD  . feeding supplement (NEPRO CARB STEADY)  240 mL Oral BID  . ferric citrate  420 mg Oral TID WC  . lidocaine-prilocaine  1 application Topical Q M,W,F-HD  . midodrine  5 mg Oral Q M,W,F-HD  . multivitamin with minerals  1 tablet Oral Daily  . pantoprazole  40 mg Oral BID  . sodium chloride flush  3 mL Intravenous Q12H  . sodium chloride flush  3 mL Intravenous Q12H  . warfarin  10 mg Oral ONCE-1800  . warfarin  15 mg Oral ONCE-1800  . Warfarin - Pharmacist Dosing Inpatient   Does not apply q1800   Continuous Infusions: . sodium chloride    .  ferric gluconate (FERRLECIT/NULECIT) IV       Niel Hummer, MD [facility] Pager [phone]  If 7PM-7AM, please contact night-coverage [email or web address] Password [email or web address] [place] 07/20/2017, 2:01 PM

## 2017-07-21 LAB — BASIC METABOLIC PANEL
Anion gap: 14 (ref 5–15)
BUN: 13 mg/dL (ref 6–20)
CO2: 30 mmol/L (ref 22–32)
Calcium: 9.1 mg/dL (ref 8.9–10.3)
Chloride: 93 mmol/L — ABNORMAL LOW (ref 101–111)
Creatinine, Ser: 5.3 mg/dL — ABNORMAL HIGH (ref 0.61–1.24)
GFR calc Af Amer: 13 mL/min — ABNORMAL LOW (ref >60)
GFR calc non Af Amer: 11 mL/min — ABNORMAL LOW (ref >60)
Glucose, Bld: 110 mg/dL — ABNORMAL HIGH (ref 65–99)
Potassium: 3.9 mmol/L (ref 3.5–5.1)
Sodium: 137 mmol/L (ref 135–145)

## 2017-07-21 LAB — PROTIME-INR
INR: 1.38
Prothrombin Time: 16.8 seconds — ABNORMAL HIGH (ref 11.4–15.2)

## 2017-07-21 MED ORDER — AMIODARONE HCL 200 MG PO TABS: 100.0000 mg | ORAL_TABLET | Freq: Every day | ORAL | 1 refills | Status: DC

## 2017-07-21 MED ORDER — WARFARIN SODIUM 7.5 MG PO TABS: 15.0000 mg | ORAL_TABLET | Freq: Once | ORAL | Status: DC

## 2017-07-21 NOTE — Care Management Important Message (Signed)
Important Message  Patient Details  Name: [name] MRN: [id number] Date of Birth: July 28, 1960   Medicare Important Message Given:  Yes    Orbie Pyo 07/21/2017, 1:18 PM

## 2017-07-21 NOTE — Discharge Instructions (Addendum)

## 2017-07-21 NOTE — Progress Notes (Signed)
ANTICOAGULATION CONSULT NOTE - Follow Up Consult  Pharmacy Consult for Warfarin Indication: atrial fibrillation  Allergies  Allergen Reactions  . Ace Inhibitors Itching and Cough    Patient Measurements: Height: 6\' 1"  (185.4 cm) Weight: 242 lb 15.2 oz (110.2 kg) IBW/kg (Calculated) : 79.9  Vital Signs: Temp: 97.6 F (36.4 C) (02/28 0212) Temp Source: Oral (02/28 0212) BP: 111/68 (02/28 0212) Pulse Rate: 100 (02/28 0212)  Labs: Recent Labs    07/19/17 0951 07/20/17 0214 07/21/17 0430  HGB  --  10.0*  --   HCT  --  30.3*  --   PLT  --  180  --   LABPROT 24.4* 21.6* 16.8*  INR 2.22 1.89 1.38  CREATININE 10.77* 12.73*  12.65* 5.30*    Estimated Creatinine Clearance: 20.7 mL/min (A) (by C-G formula based on SCr of 5.3 mg/dL (H)).   Assessment: 57 year old male on warfarin prior to admission for Afib INR today = 3.16 - > 2.22 >1.89>1.38 Dose not charted 2/25 or 2/26 Dose prior to admission = 15 mg po daily  Goal of Therapy:  INR 2-3 Monitor platelets by anticoagulation protocol: Yes   Plan:  Warfarin 15 mg po x 1 dose tonight Daily INR  Thank you Anette Guarneri, PharmD [phone]  07/21/2017,9:30 AM

## 2017-07-21 NOTE — Progress Notes (Signed)
Subjective:  No current cos / HD late last AM  reports cramps at end  Now ~3 kg below edw / Will walk hall this see if dyspnea none walking in Rm DW Albert RN ck O2 sat   Objective Vital signs in last 24 hours: Vitals:   07/21/17 0030 07/21/17 0055 07/21/17 0122 07/21/17 0212  BP: 112/78 100/67 111/70 111/68  Pulse: 93 96 92 100  Resp: 19 19 (!) 21 20  Temp:   98 F (36.7 C) 97.6 F (36.4 C)  TempSrc:   Oral Oral  SpO2: 99% 100% 100% 100%  Weight:   110.2 kg (242 lb 15.2 oz)   Height:       Weight change: -1.6 kg (-8.4 oz)  Physical Exam: General:Alert, Pleasant ,NAD  Heart:RRR soft 1/6 sem , no rub or gallop Lungs:CTA , Abdomen:Soft , obese, NT,ND Extremities:No pedal edema Dialysis Access:pos bruit L FA AVF   OP HD=Center:[facility] MWF.Uf profile 4 EDW113.5HD Bath 2k, 2.25caTime 4hrHeparin 7000. AccessLFA AVF  Meds = Hec 4 mcg qhd ,Parsabiv 12.5 q hd, Venofer 50 mg q wed hd   Problem/Plan: 1. Sob with Hypoxia/ volume overload /with HO CM Ef 30%=hd last pm for uf today hd is .Normal Schedule /Dw him plan =4 times a week  Hd op sec  CM, he is  Agreeable, Next HD in AM on schedule  Wants to walk hall to see if sob / ck O2 sat / Have ncreased Midodrine / lower edw at dc ~~ 110.5 kg ?   Dc soon ??  95 %  O2 sat walking halls  rm air  2. ESRD -HD MWF Schedule currently /as op will incr 4x/wk ,have notified [facility]  3. Hypotension/volume -BP up on admit with ^ vol (also on Midodrine )am bp 111/70 .,needs lower edw as in 1. 4. Anemia - hgb 11.9>10.4>10.0 = started Aranesp 40 07/20/17   With dropping and on weekly venofer q wed hd  5. Metabolic bone disease -Hec on hd , (parsabiv not avail)Auryxia and calcium acetate binders listed ^ Corec Ca (hold calcium acetate in hosp)phos 6.3 incr  Auryxia 2ac. 6. HO A.Flutter = Sinus on my exam /tele/ On Coumadin /Amiodarone  7. DM type 2 - per admit   Ernest Haber, PA-C [place] [phone] 07/21/2017,9:34 AM  LOS: 4 days   Labs: Basic Metabolic Panel: Recent Labs  Lab 07/18/17 0531 07/19/17 0951 07/20/17 0214 07/21/17 0430  NA 135 135 134*  134* 137  K 4.7 4.4 4.3  4.3 3.9  CL 96* 97* 96*  96* 93*  CO2 25 24 24  25 30   GLUCOSE 114* 70 98  98 110*  BUN 46* 35* 47*  47* 13  CREATININE 13.73* 10.77* 12.73*  12.65* 5.30*  CALCIUM 9.2 9.4 9.2  9.2 9.1  PHOS 5.8*  --  6.3*  --    Liver Function Tests: Recent Labs  Lab 07/18/17 0531 07/20/17 0214  ALBUMIN 3.2* 3.1*   No results for input(s): LIPASE, AMYLASE in the last 168 hours. No results for input(s): AMMONIA in the last 168 hours. CBC: Recent Labs  Lab 07/17/17 0710 07/18/17 0531 07/20/17 0214  WBC 5.0 4.8 3.8*  HGB 11.8* 10.4* 10.0*  HCT 37.4* 32.8* 30.3*  MCV 95.4 96.2 94.4  PLT 220 185 180   Cardiac Enzymes: No results for input(s): CKTOTAL, CKMB, CKMBINDEX, TROPONINI in the last 168 hours. CBG: No results for input(s): GLUCAP in the last 168 hours.  Studies/Results: No results found. Medications: . sodium chloride    . ferric gluconate (FERRLECIT/NULECIT) IV 62.5 mg (07/20/17 2354)   . allopurinol  150 mg Oral QODAY  . amiodarone  100 mg Oral Daily  . darbepoetin (ARANESP) injection - DIALYSIS  40 mcg Intravenous Q Wed-HD  . doxercalciferol  4 mcg Intravenous Q M,W,F-HD  . feeding supplement (NEPRO CARB STEADY)  240 mL Oral BID  . ferric citrate  420 mg Oral TID WC  . lidocaine-prilocaine  1 application Topical Q M,W,F-HD  . midodrine  5 mg Oral Q M,W,F-HD  . multivitamin with minerals  1 tablet Oral Daily  . pantoprazole  40 mg Oral BID  . sodium chloride flush  3 mL Intravenous Q12H  . sodium chloride flush  3 mL Intravenous Q12H  . warfarin  15 mg Oral ONCE-1800  . Warfarin - Pharmacist Dosing Inpatient   Does not apply [phone]

## 2017-07-21 NOTE — Progress Notes (Signed)
HD tx initiated via 15Gx2 w/o problem, pull/push/flush well, VSS, will cont to monitor while on HD tx 

## 2017-07-21 NOTE — Progress Notes (Signed)
HD tx completed @ 0055 w/o problem, UF goal met, blood rinsed back, VSS, report called to Emmanuel Castro, RN 

## 2017-07-21 NOTE — Progress Notes (Signed)
SATURATION QUALIFICATIONS: (This note is used to comply with regulatory documentation for home oxygen)  Patient Saturations on Room Air at Rest = 100%  Patient Saturations on Room Air while Ambulating =95%   Patient Saturations on   N/A Liters of oxygen while Ambulating = n/a  Please briefly explain why patient needs home oxygen:

## 2017-07-21 NOTE — Discharge Summary (Signed)
Physician Discharge Summary  [name] ZOX:[id number] DOB: [birth year] DOA: 07/17/2017  PCP: Iona Beard, MD  Admit date: 07/17/2017 Discharge date: 07/21/2017  Admitted From: Home  Disposition:  Home   Recommendations for Outpatient Follow-up:  1. Follow up with PCP in 1-2 weeks 2. Please obtain BMP/CBC in one week 3. Follow up with cardiologist for further care of HF.  4. Also need INR check.   Home Health: none  Discharge Condition: stable.  CODE STATUS: full code.  Diet recommendation: Heart Healthy   Brief/Interim Summary: Brief Narrative / Interim history: 57 yo M with ESRD on HD MWF, PAF, severe combined CHF, DM who was admitted with shortness of breath. He was found to have fluid overload and was admitted to the hospital. His primary cardiologist is Dr. Carlisle Beers. He saw Dr. Haroldine Laws in November 2018 in CHF clinic without many options for treatment. He has been off his HF meds due to hypotension requiring Midodrine.   Assessment & Plan: Principal Problem:   Acute respiratory failure with hypoxemia (HCC) Active Problems:   DM II (diabetes mellitus, type II), controlled (HCC)   End-stage renal disease on hemodialysis (HCC)   Paroxysmal atrial flutter (HCC)   Blind right eye   Acute on chronic combined systolic and diastolic CHF, NYHA class 4 ([place])    Acute hypoxic respiratory failure -due to volume overload, HD on 2/25 ,  2/26 and today 2-27 -Nephrology is discussing with patient regarding outpatient hemodialysis for 4 days a week instead of 3.  -wean off oxygen as tolerated -influenza negative.  resolved.   Acute on chronic combined systolic and diastolic CHF -improved with multiples HD treatments  -continue with HD -He was evaluated by CHF clinic   Hypotension; during HD. on midodrine.   Anemia; on aranesp, ferric gluconate.  Hb stable at 10.   DM2 -monitor, controlled  ESRD on HD -nephrology following.   PAF -currently in sinus, continue  Amiodarone, AC with Coumadin  -plan to resume home dose coumadin. Patient didn't received coumadin in the last 2 days.   Right eye blindness      Discharge Diagnoses:  Principal Problem:   Acute respiratory failure with hypoxemia ([place]) Active Problems:   DM II (diabetes mellitus, type II), controlled (HCC)   End-stage renal disease on hemodialysis (HCC)   Paroxysmal atrial flutter (HCC)   Blind right eye   Acute on chronic combined systolic and diastolic CHF, NYHA class 4 ([place])    Discharge Instructions  Discharge Instructions    Amb Referral to HF Clinic   Complete by:  As directed    Diet - low sodium heart healthy   Complete by:  As directed    Increase activity slowly   Complete by:  As directed      Allergies as of 07/21/2017      Reactions   Ace Inhibitors Itching, Cough      Medication List    TAKE these medications   allopurinol 300 MG tablet Commonly known as:  ZYLOPRIM Take 150 mg by mouth every other day.   amiodarone 200 MG tablet Commonly known as:  PACERONE Take 0.5 tablets (100 mg total) by mouth daily.   AURYXIA 1 GM 210 MG(Fe) tablet Generic drug:  ferric citrate Take 210 mg by mouth 3 (three) times daily with meals.   calcium acetate 667 MG capsule Commonly known as:  PHOSLO Take 667-2,001 mg by mouth See admin instructions. Take three capsules (2001 mg) by mouth three times daily with  meals and one capsule (667 mg) with snacks   feeding supplement (NEPRO CARB STEADY) Liqd Take 240 mLs by mouth 2 (two) times daily.   lidocaine-prilocaine cream Commonly known as:  EMLA Apply 1 application topically See admin instructions. Apply topically to port access prior to dialysis - Monday, Wednesday, Friday   midodrine 10 MG tablet Commonly known as:  PROAMATINE Take 0.5 tablets (5 mg total) by mouth every Monday, Wednesday, and Friday with hemodialysis. Take on the morning of dialysis days What changed:    how much to take  when to take  this  additional instructions   multivitamin Tabs tablet Take 1 tablet by mouth at bedtime.   nitroGLYCERIN 0.4 MG SL tablet Commonly known as:  NITROSTAT Place 0.4 mg under the tongue every 5 (five) minutes as needed for chest pain.   oxyCODONE-acetaminophen 5-325 MG tablet Commonly known as:  PERCOCET/ROXICET Take 2 tablets by mouth 2 (two) times daily as needed (gout pain).   pantoprazole 40 MG tablet Commonly known as:  PROTONIX Take 40 mg by mouth 2 (two) times daily.   warfarin 10 MG tablet Commonly known as:  COUMADIN Take 15 mg by mouth daily at 6 PM.       Allergies  Allergen Reactions  . Ace Inhibitors Itching and Cough    Consultations:  Nephrology    Procedures/Studies: Dg Chest 2 View  Result Date: 07/17/2017 CLINICAL DATA:  Chest tightness.  Nonproductive cough for 1 week. EXAM: CHEST  2 VIEW COMPARISON:  03/16/2017 FINDINGS: Moderate cardiomegaly. Mild vascular congestion without interstitial edema. No pneumothorax. No pleural effusion. IMPRESSION: Cardiomegaly and mild vascular congestion without interstitial edema. Electronically Signed   By: Marybelle Killings M.D.   On: 07/17/2017 08:08       Subjective: He wants to go home. He is feeling better. INR is low, will resume home dose coumadin. Would avoid discharge him on Lovenox because he is HD patient.   Discharge Exam: Vitals:   07/21/17 0212 07/21/17 0947  BP: 111/68 127/89  Pulse: 100 (!) 104  Resp: 20 19  Temp: 97.6 F (36.4 C) 98.1 F (36.7 C)  SpO2: 100% 100%   Vitals:   07/21/17 0055 07/21/17 0122 07/21/17 0212 07/21/17 0947  BP: 100/67 111/70 111/68 127/89  Pulse: 96 92 100 (!) 104  Resp: 19 (!) 21 20 19   Temp:  98 F (36.7 C) 97.6 F (36.4 C) 98.1 F (36.7 C)  TempSrc:  Oral Oral Oral  SpO2: 100% 100% 100% 100%  Weight:  110.2 kg (242 lb 15.2 oz)    Height:        General: Pt is alert, awake, not in acute distress Cardiovascular: RRR, S1/S2 +, no rubs, no  gallops Respiratory: CTA bilaterally, no wheezing, no rhonchi Abdominal: Soft, NT, ND, bowel sounds + Extremities: no edema, no cyanosis    The results of significant diagnostics from this hospitalization (including imaging, microbiology, ancillary and laboratory) are listed below for reference.     Microbiology: Recent Results (from the past 240 hour(s))  MRSA PCR Screening     Status: None   Collection Time: 07/17/17 10:42 PM  Result Value Ref Range Status   MRSA by PCR NEGATIVE NEGATIVE Final    Comment:        The GeneXpert MRSA Assay (FDA approved for NASAL specimens only), is one component of a comprehensive MRSA colonization surveillance program. It is not intended to diagnose MRSA infection nor to guide or monitor treatment for MRSA  infections. Performed at [facility], [place] [address], [place], [place] [address]      Labs: BNP (last 3 results) Recent Labs    07/18/17 0531  BNP 8,299.3*   Basic Metabolic Panel: Recent Labs  Lab 07/17/17 0710 07/18/17 0531 07/19/17 0951 07/20/17 0214 07/21/17 0430  NA 134* 135 135 134*  134* 137  K 5.4* 4.7 4.4 4.3  4.3 3.9  CL 96* 96* 97* 96*  96* 93*  CO2 23 25 24 24  25 30   GLUCOSE 84 114* 70 98  98 110*  BUN 32* 46* 35* 47*  47* 13  CREATININE 11.44* 13.73* 10.77* 12.73*  12.65* 5.30*  CALCIUM 9.4 9.2 9.4 9.2  9.2 9.1  PHOS  --  5.8*  --  6.3*  --    Liver Function Tests: Recent Labs  Lab 07/18/17 0531 07/20/17 0214  ALBUMIN 3.2* 3.1*   No results for input(s): LIPASE, AMYLASE in the last 168 hours. No results for input(s): AMMONIA in the last 168 hours. CBC: Recent Labs  Lab 07/17/17 0710 07/18/17 0531 07/20/17 0214  WBC 5.0 4.8 3.8*  HGB 11.8* 10.4* 10.0*  HCT 37.4* 32.8* 30.3*  MCV 95.4 96.2 94.4  PLT 220 185 180   Cardiac Enzymes: No results for input(s): CKTOTAL, CKMB, CKMBINDEX, TROPONINI in the last 168 hours. BNP: Invalid input(s): POCBNP CBG: No results for input(s):  GLUCAP in the last 168 hours. D-Dimer No results for input(s): DDIMER in the last 72 hours. Hgb A1c No results for input(s): HGBA1C in the last 72 hours. Lipid Profile No results for input(s): CHOL, HDL, LDLCALC, TRIG, CHOLHDL, LDLDIRECT in the last 72 hours. Thyroid function studies No results for input(s): TSH, T4TOTAL, T3FREE, THYROIDAB in the last 72 hours.  Invalid input(s): FREET3 Anemia work up No results for input(s): VITAMINB12, FOLATE, FERRITIN, TIBC, IRON, RETICCTPCT in the last 72 hours. Urinalysis    Component Value Date/Time   COLORURINE YELLOW 05/19/2011 1042   APPEARANCEUR CLEAR 05/19/2011 1042   LABSPEC 1.013 05/19/2011 1042   PHURINE 6.5 05/19/2011 1042   GLUCOSEU NEGATIVE 05/19/2011 1042   HGBUR TRACE (A) 05/19/2011 1042   BILIRUBINUR NEGATIVE 05/19/2011 1042   KETONESUR NEGATIVE 05/19/2011 1042   PROTEINUR >300 (A) 05/19/2011 1042   UROBILINOGEN 0.2 05/19/2011 1042   NITRITE NEGATIVE 05/19/2011 1042   LEUKOCYTESUR NEGATIVE 05/19/2011 1042   Sepsis Labs Invalid input(s): PROCALCITONIN,  WBC,  LACTICIDVEN Microbiology Recent Results (from the past 240 hour(s))  MRSA PCR Screening     Status: None   Collection Time: 07/17/17 10:42 PM  Result Value Ref Range Status   MRSA by PCR NEGATIVE NEGATIVE Final    Comment:        The GeneXpert MRSA Assay (FDA approved for NASAL specimens only), is one component of a comprehensive MRSA colonization surveillance program. It is not intended to diagnose MRSA infection nor to guide or monitor treatment for MRSA infections. Performed at [facility], [place] [address] [place],  [address]      Time coordinating discharge: Over 30 minutes  SIGNED:   Elmarie Shiley, MD  [facility] 07/21/2017, 11:23 AM Pager   If 7PM-7AM, please contact night-coverage [email or web address] Password [email or web address]

## 2017-07-22 DIAGNOSIS — N186 End stage renal disease: Secondary | ICD-10-CM | POA: Diagnosis not present

## 2017-07-22 DIAGNOSIS — Z992 Dependence on renal dialysis: Secondary | ICD-10-CM | POA: Diagnosis not present

## 2017-07-22 DIAGNOSIS — N2581 Secondary hyperparathyroidism of renal origin: Secondary | ICD-10-CM | POA: Diagnosis not present

## 2017-07-22 DIAGNOSIS — I4892 Unspecified atrial flutter: Secondary | ICD-10-CM | POA: Diagnosis not present

## 2017-07-22 DIAGNOSIS — I120 Hypertensive chronic kidney disease with stage 5 chronic kidney disease or end stage renal disease: Secondary | ICD-10-CM | POA: Diagnosis not present

## 2017-07-22 DIAGNOSIS — E1129 Type 2 diabetes mellitus with other diabetic kidney complication: Secondary | ICD-10-CM | POA: Diagnosis not present

## 2017-07-22 DIAGNOSIS — D631 Anemia in chronic kidney disease: Secondary | ICD-10-CM | POA: Diagnosis not present

## 2017-07-22 DIAGNOSIS — D509 Iron deficiency anemia, unspecified: Secondary | ICD-10-CM | POA: Diagnosis not present

## 2017-07-25 DIAGNOSIS — Z992 Dependence on renal dialysis: Secondary | ICD-10-CM | POA: Diagnosis not present

## 2017-07-25 DIAGNOSIS — D509 Iron deficiency anemia, unspecified: Secondary | ICD-10-CM | POA: Diagnosis not present

## 2017-07-25 DIAGNOSIS — N186 End stage renal disease: Secondary | ICD-10-CM | POA: Diagnosis not present

## 2017-07-25 DIAGNOSIS — E1129 Type 2 diabetes mellitus with other diabetic kidney complication: Secondary | ICD-10-CM | POA: Diagnosis not present

## 2017-07-25 DIAGNOSIS — N2581 Secondary hyperparathyroidism of renal origin: Secondary | ICD-10-CM | POA: Diagnosis not present

## 2017-07-25 DIAGNOSIS — D631 Anemia in chronic kidney disease: Secondary | ICD-10-CM | POA: Diagnosis not present

## 2017-07-27 DIAGNOSIS — D509 Iron deficiency anemia, unspecified: Secondary | ICD-10-CM | POA: Diagnosis not present

## 2017-07-27 DIAGNOSIS — Z992 Dependence on renal dialysis: Secondary | ICD-10-CM | POA: Diagnosis not present

## 2017-07-27 DIAGNOSIS — E1129 Type 2 diabetes mellitus with other diabetic kidney complication: Secondary | ICD-10-CM | POA: Diagnosis not present

## 2017-07-27 DIAGNOSIS — N2581 Secondary hyperparathyroidism of renal origin: Secondary | ICD-10-CM | POA: Diagnosis not present

## 2017-07-27 DIAGNOSIS — D631 Anemia in chronic kidney disease: Secondary | ICD-10-CM | POA: Diagnosis not present

## 2017-07-27 DIAGNOSIS — N186 End stage renal disease: Secondary | ICD-10-CM | POA: Diagnosis not present

## 2017-07-29 DIAGNOSIS — Z992 Dependence on renal dialysis: Secondary | ICD-10-CM | POA: Diagnosis not present

## 2017-07-29 DIAGNOSIS — E1129 Type 2 diabetes mellitus with other diabetic kidney complication: Secondary | ICD-10-CM | POA: Diagnosis not present

## 2017-07-29 DIAGNOSIS — N2581 Secondary hyperparathyroidism of renal origin: Secondary | ICD-10-CM | POA: Diagnosis not present

## 2017-07-29 DIAGNOSIS — D631 Anemia in chronic kidney disease: Secondary | ICD-10-CM | POA: Diagnosis not present

## 2017-07-29 DIAGNOSIS — N186 End stage renal disease: Secondary | ICD-10-CM | POA: Diagnosis not present

## 2017-07-29 DIAGNOSIS — D509 Iron deficiency anemia, unspecified: Secondary | ICD-10-CM | POA: Diagnosis not present

## 2017-08-01 DIAGNOSIS — D631 Anemia in chronic kidney disease: Secondary | ICD-10-CM | POA: Diagnosis not present

## 2017-08-01 DIAGNOSIS — N186 End stage renal disease: Secondary | ICD-10-CM | POA: Diagnosis not present

## 2017-08-01 DIAGNOSIS — E1129 Type 2 diabetes mellitus with other diabetic kidney complication: Secondary | ICD-10-CM | POA: Diagnosis not present

## 2017-08-01 DIAGNOSIS — Z992 Dependence on renal dialysis: Secondary | ICD-10-CM | POA: Diagnosis not present

## 2017-08-01 DIAGNOSIS — N2581 Secondary hyperparathyroidism of renal origin: Secondary | ICD-10-CM | POA: Diagnosis not present

## 2017-08-01 DIAGNOSIS — D509 Iron deficiency anemia, unspecified: Secondary | ICD-10-CM | POA: Diagnosis not present

## 2017-08-02 DIAGNOSIS — I340 Nonrheumatic mitral (valve) insufficiency: Secondary | ICD-10-CM | POA: Diagnosis not present

## 2017-08-02 DIAGNOSIS — I5022 Chronic systolic (congestive) heart failure: Secondary | ICD-10-CM | POA: Diagnosis not present

## 2017-08-02 DIAGNOSIS — I2510 Atherosclerotic heart disease of native coronary artery without angina pectoris: Secondary | ICD-10-CM | POA: Diagnosis not present

## 2017-08-02 DIAGNOSIS — I361 Nonrheumatic tricuspid (valve) insufficiency: Secondary | ICD-10-CM | POA: Diagnosis not present

## 2017-08-02 DIAGNOSIS — E6609 Other obesity due to excess calories: Secondary | ICD-10-CM | POA: Diagnosis not present

## 2017-08-03 DIAGNOSIS — D631 Anemia in chronic kidney disease: Secondary | ICD-10-CM | POA: Diagnosis not present

## 2017-08-03 DIAGNOSIS — D509 Iron deficiency anemia, unspecified: Secondary | ICD-10-CM | POA: Diagnosis not present

## 2017-08-03 DIAGNOSIS — N2581 Secondary hyperparathyroidism of renal origin: Secondary | ICD-10-CM | POA: Diagnosis not present

## 2017-08-03 DIAGNOSIS — N186 End stage renal disease: Secondary | ICD-10-CM | POA: Diagnosis not present

## 2017-08-03 DIAGNOSIS — E1129 Type 2 diabetes mellitus with other diabetic kidney complication: Secondary | ICD-10-CM | POA: Diagnosis not present

## 2017-08-03 DIAGNOSIS — Z992 Dependence on renal dialysis: Secondary | ICD-10-CM | POA: Diagnosis not present

## 2017-08-05 DIAGNOSIS — Z992 Dependence on renal dialysis: Secondary | ICD-10-CM | POA: Diagnosis not present

## 2017-08-05 DIAGNOSIS — N186 End stage renal disease: Secondary | ICD-10-CM | POA: Diagnosis not present

## 2017-08-05 DIAGNOSIS — D509 Iron deficiency anemia, unspecified: Secondary | ICD-10-CM | POA: Diagnosis not present

## 2017-08-05 DIAGNOSIS — E1129 Type 2 diabetes mellitus with other diabetic kidney complication: Secondary | ICD-10-CM | POA: Diagnosis not present

## 2017-08-05 DIAGNOSIS — N2581 Secondary hyperparathyroidism of renal origin: Secondary | ICD-10-CM | POA: Diagnosis not present

## 2017-08-05 DIAGNOSIS — D631 Anemia in chronic kidney disease: Secondary | ICD-10-CM | POA: Diagnosis not present

## 2017-08-08 DIAGNOSIS — N2581 Secondary hyperparathyroidism of renal origin: Secondary | ICD-10-CM | POA: Diagnosis not present

## 2017-08-08 DIAGNOSIS — Z992 Dependence on renal dialysis: Secondary | ICD-10-CM | POA: Diagnosis not present

## 2017-08-08 DIAGNOSIS — D631 Anemia in chronic kidney disease: Secondary | ICD-10-CM | POA: Diagnosis not present

## 2017-08-08 DIAGNOSIS — N186 End stage renal disease: Secondary | ICD-10-CM | POA: Diagnosis not present

## 2017-08-08 DIAGNOSIS — D509 Iron deficiency anemia, unspecified: Secondary | ICD-10-CM | POA: Diagnosis not present

## 2017-08-08 DIAGNOSIS — E1129 Type 2 diabetes mellitus with other diabetic kidney complication: Secondary | ICD-10-CM | POA: Diagnosis not present

## 2017-08-09 DIAGNOSIS — Z7901 Long term (current) use of anticoagulants: Secondary | ICD-10-CM | POA: Diagnosis not present

## 2017-08-09 DIAGNOSIS — E1122 Type 2 diabetes mellitus with diabetic chronic kidney disease: Secondary | ICD-10-CM | POA: Diagnosis not present

## 2017-08-09 DIAGNOSIS — N186 End stage renal disease: Secondary | ICD-10-CM | POA: Diagnosis not present

## 2017-08-09 DIAGNOSIS — I10 Essential (primary) hypertension: Secondary | ICD-10-CM | POA: Diagnosis not present

## 2017-08-09 DIAGNOSIS — I5042 Chronic combined systolic (congestive) and diastolic (congestive) heart failure: Secondary | ICD-10-CM | POA: Diagnosis not present

## 2017-08-10 DIAGNOSIS — E1129 Type 2 diabetes mellitus with other diabetic kidney complication: Secondary | ICD-10-CM | POA: Diagnosis not present

## 2017-08-10 DIAGNOSIS — N2581 Secondary hyperparathyroidism of renal origin: Secondary | ICD-10-CM | POA: Diagnosis not present

## 2017-08-10 DIAGNOSIS — D509 Iron deficiency anemia, unspecified: Secondary | ICD-10-CM | POA: Diagnosis not present

## 2017-08-10 DIAGNOSIS — N186 End stage renal disease: Secondary | ICD-10-CM | POA: Diagnosis not present

## 2017-08-10 DIAGNOSIS — Z992 Dependence on renal dialysis: Secondary | ICD-10-CM | POA: Diagnosis not present

## 2017-08-10 DIAGNOSIS — D631 Anemia in chronic kidney disease: Secondary | ICD-10-CM | POA: Diagnosis not present

## 2017-08-12 DIAGNOSIS — N186 End stage renal disease: Secondary | ICD-10-CM | POA: Diagnosis not present

## 2017-08-12 DIAGNOSIS — E1129 Type 2 diabetes mellitus with other diabetic kidney complication: Secondary | ICD-10-CM | POA: Diagnosis not present

## 2017-08-12 DIAGNOSIS — N2581 Secondary hyperparathyroidism of renal origin: Secondary | ICD-10-CM | POA: Diagnosis not present

## 2017-08-12 DIAGNOSIS — D631 Anemia in chronic kidney disease: Secondary | ICD-10-CM | POA: Diagnosis not present

## 2017-08-12 DIAGNOSIS — D509 Iron deficiency anemia, unspecified: Secondary | ICD-10-CM | POA: Diagnosis not present

## 2017-08-12 DIAGNOSIS — Z992 Dependence on renal dialysis: Secondary | ICD-10-CM | POA: Diagnosis not present

## 2017-08-15 DIAGNOSIS — D631 Anemia in chronic kidney disease: Secondary | ICD-10-CM | POA: Diagnosis not present

## 2017-08-15 DIAGNOSIS — N186 End stage renal disease: Secondary | ICD-10-CM | POA: Diagnosis not present

## 2017-08-15 DIAGNOSIS — D509 Iron deficiency anemia, unspecified: Secondary | ICD-10-CM | POA: Diagnosis not present

## 2017-08-15 DIAGNOSIS — N2581 Secondary hyperparathyroidism of renal origin: Secondary | ICD-10-CM | POA: Diagnosis not present

## 2017-08-15 DIAGNOSIS — E1129 Type 2 diabetes mellitus with other diabetic kidney complication: Secondary | ICD-10-CM | POA: Diagnosis not present

## 2017-08-15 DIAGNOSIS — Z992 Dependence on renal dialysis: Secondary | ICD-10-CM | POA: Diagnosis not present

## 2017-08-17 DIAGNOSIS — D631 Anemia in chronic kidney disease: Secondary | ICD-10-CM | POA: Diagnosis not present

## 2017-08-17 DIAGNOSIS — D509 Iron deficiency anemia, unspecified: Secondary | ICD-10-CM | POA: Diagnosis not present

## 2017-08-17 DIAGNOSIS — I4892 Unspecified atrial flutter: Secondary | ICD-10-CM | POA: Diagnosis not present

## 2017-08-17 DIAGNOSIS — N186 End stage renal disease: Secondary | ICD-10-CM | POA: Diagnosis not present

## 2017-08-17 DIAGNOSIS — E1129 Type 2 diabetes mellitus with other diabetic kidney complication: Secondary | ICD-10-CM | POA: Diagnosis not present

## 2017-08-17 DIAGNOSIS — Z992 Dependence on renal dialysis: Secondary | ICD-10-CM | POA: Diagnosis not present

## 2017-08-17 DIAGNOSIS — N2581 Secondary hyperparathyroidism of renal origin: Secondary | ICD-10-CM | POA: Diagnosis not present

## 2017-08-19 DIAGNOSIS — D631 Anemia in chronic kidney disease: Secondary | ICD-10-CM | POA: Diagnosis not present

## 2017-08-19 DIAGNOSIS — N2581 Secondary hyperparathyroidism of renal origin: Secondary | ICD-10-CM | POA: Diagnosis not present

## 2017-08-19 DIAGNOSIS — E1129 Type 2 diabetes mellitus with other diabetic kidney complication: Secondary | ICD-10-CM | POA: Diagnosis not present

## 2017-08-19 DIAGNOSIS — Z992 Dependence on renal dialysis: Secondary | ICD-10-CM | POA: Diagnosis not present

## 2017-08-19 DIAGNOSIS — D509 Iron deficiency anemia, unspecified: Secondary | ICD-10-CM | POA: Diagnosis not present

## 2017-08-19 DIAGNOSIS — N186 End stage renal disease: Secondary | ICD-10-CM | POA: Diagnosis not present

## 2017-08-22 DIAGNOSIS — E1129 Type 2 diabetes mellitus with other diabetic kidney complication: Secondary | ICD-10-CM | POA: Diagnosis not present

## 2017-08-22 DIAGNOSIS — N186 End stage renal disease: Secondary | ICD-10-CM | POA: Diagnosis not present

## 2017-08-22 DIAGNOSIS — Z992 Dependence on renal dialysis: Secondary | ICD-10-CM | POA: Diagnosis not present

## 2017-08-22 DIAGNOSIS — D631 Anemia in chronic kidney disease: Secondary | ICD-10-CM | POA: Diagnosis not present

## 2017-08-22 DIAGNOSIS — N2581 Secondary hyperparathyroidism of renal origin: Secondary | ICD-10-CM | POA: Diagnosis not present

## 2017-08-22 DIAGNOSIS — I120 Hypertensive chronic kidney disease with stage 5 chronic kidney disease or end stage renal disease: Secondary | ICD-10-CM | POA: Diagnosis not present

## 2017-08-24 DIAGNOSIS — E1129 Type 2 diabetes mellitus with other diabetic kidney complication: Secondary | ICD-10-CM | POA: Diagnosis not present

## 2017-08-24 DIAGNOSIS — D631 Anemia in chronic kidney disease: Secondary | ICD-10-CM | POA: Diagnosis not present

## 2017-08-24 DIAGNOSIS — N2581 Secondary hyperparathyroidism of renal origin: Secondary | ICD-10-CM | POA: Diagnosis not present

## 2017-08-24 DIAGNOSIS — N186 End stage renal disease: Secondary | ICD-10-CM | POA: Diagnosis not present

## 2017-08-24 DIAGNOSIS — Z992 Dependence on renal dialysis: Secondary | ICD-10-CM | POA: Diagnosis not present

## 2017-08-26 DIAGNOSIS — N2581 Secondary hyperparathyroidism of renal origin: Secondary | ICD-10-CM | POA: Diagnosis not present

## 2017-08-26 DIAGNOSIS — Z992 Dependence on renal dialysis: Secondary | ICD-10-CM | POA: Diagnosis not present

## 2017-08-26 DIAGNOSIS — D631 Anemia in chronic kidney disease: Secondary | ICD-10-CM | POA: Diagnosis not present

## 2017-08-26 DIAGNOSIS — N186 End stage renal disease: Secondary | ICD-10-CM | POA: Diagnosis not present

## 2017-08-26 DIAGNOSIS — E1129 Type 2 diabetes mellitus with other diabetic kidney complication: Secondary | ICD-10-CM | POA: Diagnosis not present

## 2017-08-29 DIAGNOSIS — D631 Anemia in chronic kidney disease: Secondary | ICD-10-CM | POA: Diagnosis not present

## 2017-08-29 DIAGNOSIS — E1129 Type 2 diabetes mellitus with other diabetic kidney complication: Secondary | ICD-10-CM | POA: Diagnosis not present

## 2017-08-29 DIAGNOSIS — N2581 Secondary hyperparathyroidism of renal origin: Secondary | ICD-10-CM | POA: Diagnosis not present

## 2017-08-29 DIAGNOSIS — Z992 Dependence on renal dialysis: Secondary | ICD-10-CM | POA: Diagnosis not present

## 2017-08-29 DIAGNOSIS — N186 End stage renal disease: Secondary | ICD-10-CM | POA: Diagnosis not present

## 2017-08-31 DIAGNOSIS — N2581 Secondary hyperparathyroidism of renal origin: Secondary | ICD-10-CM | POA: Diagnosis not present

## 2017-08-31 DIAGNOSIS — Z992 Dependence on renal dialysis: Secondary | ICD-10-CM | POA: Diagnosis not present

## 2017-08-31 DIAGNOSIS — E1129 Type 2 diabetes mellitus with other diabetic kidney complication: Secondary | ICD-10-CM | POA: Diagnosis not present

## 2017-08-31 DIAGNOSIS — D631 Anemia in chronic kidney disease: Secondary | ICD-10-CM | POA: Diagnosis not present

## 2017-08-31 DIAGNOSIS — N186 End stage renal disease: Secondary | ICD-10-CM | POA: Diagnosis not present

## 2017-09-02 DIAGNOSIS — N186 End stage renal disease: Secondary | ICD-10-CM | POA: Diagnosis not present

## 2017-09-02 DIAGNOSIS — D631 Anemia in chronic kidney disease: Secondary | ICD-10-CM | POA: Diagnosis not present

## 2017-09-02 DIAGNOSIS — N2581 Secondary hyperparathyroidism of renal origin: Secondary | ICD-10-CM | POA: Diagnosis not present

## 2017-09-02 DIAGNOSIS — E1129 Type 2 diabetes mellitus with other diabetic kidney complication: Secondary | ICD-10-CM | POA: Diagnosis not present

## 2017-09-02 DIAGNOSIS — Z992 Dependence on renal dialysis: Secondary | ICD-10-CM | POA: Diagnosis not present

## 2017-09-05 DIAGNOSIS — E1129 Type 2 diabetes mellitus with other diabetic kidney complication: Secondary | ICD-10-CM | POA: Diagnosis not present

## 2017-09-05 DIAGNOSIS — Z992 Dependence on renal dialysis: Secondary | ICD-10-CM | POA: Diagnosis not present

## 2017-09-05 DIAGNOSIS — D631 Anemia in chronic kidney disease: Secondary | ICD-10-CM | POA: Diagnosis not present

## 2017-09-05 DIAGNOSIS — N186 End stage renal disease: Secondary | ICD-10-CM | POA: Diagnosis not present

## 2017-09-05 DIAGNOSIS — N2581 Secondary hyperparathyroidism of renal origin: Secondary | ICD-10-CM | POA: Diagnosis not present

## 2017-09-07 DIAGNOSIS — E1129 Type 2 diabetes mellitus with other diabetic kidney complication: Secondary | ICD-10-CM | POA: Diagnosis not present

## 2017-09-07 DIAGNOSIS — N2581 Secondary hyperparathyroidism of renal origin: Secondary | ICD-10-CM | POA: Diagnosis not present

## 2017-09-07 DIAGNOSIS — N186 End stage renal disease: Secondary | ICD-10-CM | POA: Diagnosis not present

## 2017-09-07 DIAGNOSIS — D631 Anemia in chronic kidney disease: Secondary | ICD-10-CM | POA: Diagnosis not present

## 2017-09-07 DIAGNOSIS — Z992 Dependence on renal dialysis: Secondary | ICD-10-CM | POA: Diagnosis not present

## 2017-09-09 DIAGNOSIS — D631 Anemia in chronic kidney disease: Secondary | ICD-10-CM | POA: Diagnosis not present

## 2017-09-09 DIAGNOSIS — E1129 Type 2 diabetes mellitus with other diabetic kidney complication: Secondary | ICD-10-CM | POA: Diagnosis not present

## 2017-09-09 DIAGNOSIS — Z992 Dependence on renal dialysis: Secondary | ICD-10-CM | POA: Diagnosis not present

## 2017-09-09 DIAGNOSIS — N186 End stage renal disease: Secondary | ICD-10-CM | POA: Diagnosis not present

## 2017-09-09 DIAGNOSIS — N2581 Secondary hyperparathyroidism of renal origin: Secondary | ICD-10-CM | POA: Diagnosis not present

## 2017-09-12 DIAGNOSIS — D631 Anemia in chronic kidney disease: Secondary | ICD-10-CM | POA: Diagnosis not present

## 2017-09-12 DIAGNOSIS — E1129 Type 2 diabetes mellitus with other diabetic kidney complication: Secondary | ICD-10-CM | POA: Diagnosis not present

## 2017-09-12 DIAGNOSIS — N2581 Secondary hyperparathyroidism of renal origin: Secondary | ICD-10-CM | POA: Diagnosis not present

## 2017-09-12 DIAGNOSIS — N186 End stage renal disease: Secondary | ICD-10-CM | POA: Diagnosis not present

## 2017-09-12 DIAGNOSIS — Z992 Dependence on renal dialysis: Secondary | ICD-10-CM | POA: Diagnosis not present

## 2017-09-14 DIAGNOSIS — D631 Anemia in chronic kidney disease: Secondary | ICD-10-CM | POA: Diagnosis not present

## 2017-09-14 DIAGNOSIS — N186 End stage renal disease: Secondary | ICD-10-CM | POA: Diagnosis not present

## 2017-09-14 DIAGNOSIS — Z992 Dependence on renal dialysis: Secondary | ICD-10-CM | POA: Diagnosis not present

## 2017-09-14 DIAGNOSIS — N2581 Secondary hyperparathyroidism of renal origin: Secondary | ICD-10-CM | POA: Diagnosis not present

## 2017-09-14 DIAGNOSIS — I4892 Unspecified atrial flutter: Secondary | ICD-10-CM | POA: Diagnosis not present

## 2017-09-14 DIAGNOSIS — E1129 Type 2 diabetes mellitus with other diabetic kidney complication: Secondary | ICD-10-CM | POA: Diagnosis not present

## 2017-09-16 DIAGNOSIS — N2581 Secondary hyperparathyroidism of renal origin: Secondary | ICD-10-CM | POA: Diagnosis not present

## 2017-09-16 DIAGNOSIS — D631 Anemia in chronic kidney disease: Secondary | ICD-10-CM | POA: Diagnosis not present

## 2017-09-16 DIAGNOSIS — E1129 Type 2 diabetes mellitus with other diabetic kidney complication: Secondary | ICD-10-CM | POA: Diagnosis not present

## 2017-09-16 DIAGNOSIS — Z992 Dependence on renal dialysis: Secondary | ICD-10-CM | POA: Diagnosis not present

## 2017-09-16 DIAGNOSIS — N186 End stage renal disease: Secondary | ICD-10-CM | POA: Diagnosis not present

## 2017-09-19 DIAGNOSIS — D631 Anemia in chronic kidney disease: Secondary | ICD-10-CM | POA: Diagnosis not present

## 2017-09-19 DIAGNOSIS — N186 End stage renal disease: Secondary | ICD-10-CM | POA: Diagnosis not present

## 2017-09-19 DIAGNOSIS — Z992 Dependence on renal dialysis: Secondary | ICD-10-CM | POA: Diagnosis not present

## 2017-09-19 DIAGNOSIS — E1129 Type 2 diabetes mellitus with other diabetic kidney complication: Secondary | ICD-10-CM | POA: Diagnosis not present

## 2017-09-19 DIAGNOSIS — N2581 Secondary hyperparathyroidism of renal origin: Secondary | ICD-10-CM | POA: Diagnosis not present

## 2017-09-20 DIAGNOSIS — N186 End stage renal disease: Secondary | ICD-10-CM | POA: Diagnosis not present

## 2017-09-20 DIAGNOSIS — E6609 Other obesity due to excess calories: Secondary | ICD-10-CM | POA: Diagnosis not present

## 2017-09-20 DIAGNOSIS — I2510 Atherosclerotic heart disease of native coronary artery without angina pectoris: Secondary | ICD-10-CM | POA: Diagnosis not present

## 2017-09-20 DIAGNOSIS — R0602 Shortness of breath: Secondary | ICD-10-CM | POA: Diagnosis not present

## 2017-09-20 DIAGNOSIS — I5022 Chronic systolic (congestive) heart failure: Secondary | ICD-10-CM | POA: Diagnosis not present

## 2017-09-21 DIAGNOSIS — N186 End stage renal disease: Secondary | ICD-10-CM | POA: Diagnosis not present

## 2017-09-21 DIAGNOSIS — I120 Hypertensive chronic kidney disease with stage 5 chronic kidney disease or end stage renal disease: Secondary | ICD-10-CM | POA: Diagnosis not present

## 2017-09-21 DIAGNOSIS — Z992 Dependence on renal dialysis: Secondary | ICD-10-CM | POA: Diagnosis not present

## 2017-09-21 DIAGNOSIS — N2581 Secondary hyperparathyroidism of renal origin: Secondary | ICD-10-CM | POA: Diagnosis not present

## 2017-09-21 DIAGNOSIS — E1129 Type 2 diabetes mellitus with other diabetic kidney complication: Secondary | ICD-10-CM | POA: Diagnosis not present

## 2017-09-23 DIAGNOSIS — E1129 Type 2 diabetes mellitus with other diabetic kidney complication: Secondary | ICD-10-CM | POA: Diagnosis not present

## 2017-09-23 DIAGNOSIS — N186 End stage renal disease: Secondary | ICD-10-CM | POA: Diagnosis not present

## 2017-09-23 DIAGNOSIS — N2581 Secondary hyperparathyroidism of renal origin: Secondary | ICD-10-CM | POA: Diagnosis not present

## 2017-09-23 DIAGNOSIS — Z992 Dependence on renal dialysis: Secondary | ICD-10-CM | POA: Diagnosis not present

## 2017-09-26 DIAGNOSIS — Z992 Dependence on renal dialysis: Secondary | ICD-10-CM | POA: Diagnosis not present

## 2017-09-26 DIAGNOSIS — N2581 Secondary hyperparathyroidism of renal origin: Secondary | ICD-10-CM | POA: Diagnosis not present

## 2017-09-26 DIAGNOSIS — E1129 Type 2 diabetes mellitus with other diabetic kidney complication: Secondary | ICD-10-CM | POA: Diagnosis not present

## 2017-09-26 DIAGNOSIS — N186 End stage renal disease: Secondary | ICD-10-CM | POA: Diagnosis not present

## 2017-09-28 DIAGNOSIS — N186 End stage renal disease: Secondary | ICD-10-CM | POA: Diagnosis not present

## 2017-09-28 DIAGNOSIS — E1129 Type 2 diabetes mellitus with other diabetic kidney complication: Secondary | ICD-10-CM | POA: Diagnosis not present

## 2017-09-28 DIAGNOSIS — Z992 Dependence on renal dialysis: Secondary | ICD-10-CM | POA: Diagnosis not present

## 2017-09-28 DIAGNOSIS — N2581 Secondary hyperparathyroidism of renal origin: Secondary | ICD-10-CM | POA: Diagnosis not present

## 2017-09-30 DIAGNOSIS — Z992 Dependence on renal dialysis: Secondary | ICD-10-CM | POA: Diagnosis not present

## 2017-09-30 DIAGNOSIS — N2581 Secondary hyperparathyroidism of renal origin: Secondary | ICD-10-CM | POA: Diagnosis not present

## 2017-09-30 DIAGNOSIS — E1129 Type 2 diabetes mellitus with other diabetic kidney complication: Secondary | ICD-10-CM | POA: Diagnosis not present

## 2017-09-30 DIAGNOSIS — N186 End stage renal disease: Secondary | ICD-10-CM | POA: Diagnosis not present

## 2017-10-03 DIAGNOSIS — N2581 Secondary hyperparathyroidism of renal origin: Secondary | ICD-10-CM | POA: Diagnosis not present

## 2017-10-03 DIAGNOSIS — E1129 Type 2 diabetes mellitus with other diabetic kidney complication: Secondary | ICD-10-CM | POA: Diagnosis not present

## 2017-10-03 DIAGNOSIS — N186 End stage renal disease: Secondary | ICD-10-CM | POA: Diagnosis not present

## 2017-10-03 DIAGNOSIS — Z992 Dependence on renal dialysis: Secondary | ICD-10-CM | POA: Diagnosis not present

## 2017-10-05 DIAGNOSIS — N186 End stage renal disease: Secondary | ICD-10-CM | POA: Diagnosis not present

## 2017-10-05 DIAGNOSIS — Z992 Dependence on renal dialysis: Secondary | ICD-10-CM | POA: Diagnosis not present

## 2017-10-05 DIAGNOSIS — E1129 Type 2 diabetes mellitus with other diabetic kidney complication: Secondary | ICD-10-CM | POA: Diagnosis not present

## 2017-10-05 DIAGNOSIS — N2581 Secondary hyperparathyroidism of renal origin: Secondary | ICD-10-CM | POA: Diagnosis not present

## 2017-10-07 DIAGNOSIS — E1129 Type 2 diabetes mellitus with other diabetic kidney complication: Secondary | ICD-10-CM | POA: Diagnosis not present

## 2017-10-07 DIAGNOSIS — Z992 Dependence on renal dialysis: Secondary | ICD-10-CM | POA: Diagnosis not present

## 2017-10-07 DIAGNOSIS — N2581 Secondary hyperparathyroidism of renal origin: Secondary | ICD-10-CM | POA: Diagnosis not present

## 2017-10-07 DIAGNOSIS — N186 End stage renal disease: Secondary | ICD-10-CM | POA: Diagnosis not present

## 2017-10-10 DIAGNOSIS — N186 End stage renal disease: Secondary | ICD-10-CM | POA: Diagnosis not present

## 2017-10-10 DIAGNOSIS — E1129 Type 2 diabetes mellitus with other diabetic kidney complication: Secondary | ICD-10-CM | POA: Diagnosis not present

## 2017-10-10 DIAGNOSIS — Z992 Dependence on renal dialysis: Secondary | ICD-10-CM | POA: Diagnosis not present

## 2017-10-10 DIAGNOSIS — N2581 Secondary hyperparathyroidism of renal origin: Secondary | ICD-10-CM | POA: Diagnosis not present

## 2017-10-12 DIAGNOSIS — N186 End stage renal disease: Secondary | ICD-10-CM | POA: Diagnosis not present

## 2017-10-12 DIAGNOSIS — E1129 Type 2 diabetes mellitus with other diabetic kidney complication: Secondary | ICD-10-CM | POA: Diagnosis not present

## 2017-10-12 DIAGNOSIS — I4892 Unspecified atrial flutter: Secondary | ICD-10-CM | POA: Diagnosis not present

## 2017-10-12 DIAGNOSIS — Z992 Dependence on renal dialysis: Secondary | ICD-10-CM | POA: Diagnosis not present

## 2017-10-12 DIAGNOSIS — N2581 Secondary hyperparathyroidism of renal origin: Secondary | ICD-10-CM | POA: Diagnosis not present

## 2017-10-14 DIAGNOSIS — N186 End stage renal disease: Secondary | ICD-10-CM | POA: Diagnosis not present

## 2017-10-14 DIAGNOSIS — N2581 Secondary hyperparathyroidism of renal origin: Secondary | ICD-10-CM | POA: Diagnosis not present

## 2017-10-14 DIAGNOSIS — E1129 Type 2 diabetes mellitus with other diabetic kidney complication: Secondary | ICD-10-CM | POA: Diagnosis not present

## 2017-10-14 DIAGNOSIS — Z992 Dependence on renal dialysis: Secondary | ICD-10-CM | POA: Diagnosis not present

## 2017-10-17 DIAGNOSIS — E1129 Type 2 diabetes mellitus with other diabetic kidney complication: Secondary | ICD-10-CM | POA: Diagnosis not present

## 2017-10-17 DIAGNOSIS — N186 End stage renal disease: Secondary | ICD-10-CM | POA: Diagnosis not present

## 2017-10-17 DIAGNOSIS — N2581 Secondary hyperparathyroidism of renal origin: Secondary | ICD-10-CM | POA: Diagnosis not present

## 2017-10-17 DIAGNOSIS — Z992 Dependence on renal dialysis: Secondary | ICD-10-CM | POA: Diagnosis not present

## 2017-10-19 DIAGNOSIS — N2581 Secondary hyperparathyroidism of renal origin: Secondary | ICD-10-CM | POA: Diagnosis not present

## 2017-10-19 DIAGNOSIS — E1129 Type 2 diabetes mellitus with other diabetic kidney complication: Secondary | ICD-10-CM | POA: Diagnosis not present

## 2017-10-19 DIAGNOSIS — N186 End stage renal disease: Secondary | ICD-10-CM | POA: Diagnosis not present

## 2017-10-19 DIAGNOSIS — Z992 Dependence on renal dialysis: Secondary | ICD-10-CM | POA: Diagnosis not present

## 2017-10-21 ENCOUNTER — Ambulatory Visit (HOSPITAL_COMMUNITY)
Admission: EM | Admit: 2017-10-21 | Discharge: 2017-10-21 | Disposition: A | Discharge status: Home / Self Care | Payer: Medicare Other | Attending: Family Medicine | Admitting: Family Medicine

## 2017-10-21 ENCOUNTER — Encounter (HOSPITAL_COMMUNITY): Payer: Self-pay

## 2017-10-21 DIAGNOSIS — N2581 Secondary hyperparathyroidism of renal origin: Secondary | ICD-10-CM | POA: Diagnosis not present

## 2017-10-21 DIAGNOSIS — E1129 Type 2 diabetes mellitus with other diabetic kidney complication: Secondary | ICD-10-CM | POA: Diagnosis not present

## 2017-10-21 DIAGNOSIS — S40862A Insect bite (nonvenomous) of left upper arm, initial encounter: Secondary | ICD-10-CM | POA: Diagnosis not present

## 2017-10-21 DIAGNOSIS — N186 End stage renal disease: Secondary | ICD-10-CM | POA: Diagnosis not present

## 2017-10-21 DIAGNOSIS — W57XXXA Bitten or stung by nonvenomous insect and other nonvenomous arthropods, initial encounter: Secondary | ICD-10-CM

## 2017-10-21 DIAGNOSIS — Z992 Dependence on renal dialysis: Secondary | ICD-10-CM | POA: Diagnosis not present

## 2017-10-21 MED ORDER — DOXYCYCLINE HYCLATE 100 MG PO TABS: ORAL_TABLET | ORAL | 0 refills | Status: DC

## 2017-10-21 NOTE — ED Triage Notes (Signed)
Pt presents with complaints of tick bite that is itchy. Pt is a dialysis patient and went this morning.

## 2017-10-21 NOTE — Discharge Instructions (Signed)
Take the antibiotic as instructed I do not anticipate any problems

## 2017-10-21 NOTE — ED Provider Notes (Signed)
[place]    CSN: [id number] Arrival date & time: 10/21/17  1118     History   Chief Complaint No chief complaint on file.   HPI [name] is a 57 y.o. male.   HPI  This is a 46 year old gentleman with chronic congestive heart failure, atrial flutter on Coumadin, history of renal cancer, end-stage renal disease on hemodialysis, well-controlled diabetes, and hypertension.  He is here because he has a tick bite on his left shoulder blade area.  He noticed it last night.  Wife remove the tick.  There is an itchy nodule at the site.  No fever or chills.  He is here because he showed the area to his dialysis nurse today and she recommended he be seen.  The tick was on his skin for an unknown period of time.  He does not know whether the tick was engorged. Past Medical History:  Diagnosis Date  . Atrial flutter ([place])   . 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 Cardiomyopathy   . CHF (congestive heart failure) ([place])   . Coronary artery disease    normal coronaries by 10/10/08 cath, Cardiac Cath 08-04-12 epic.Dr. Doylene Canard follows  . Diabetes mellitus    pt. states he's borderline diabetic., no longer taking med,- off med. since 2013  . Dialysis patient [facility])    Mon-Wed-Fri([place])- Left AV fistula  . Diverticulitis November 2016   reoccurred in December 2016  . ESRD (end stage renal disease) ([place])   . GERD (gastroesophageal reflux disease)   . Gout   . History of nephrectomy 07/04/2012   History of right nephrectomy in 2002 for renal cell carcinoma   . History of unilateral nephrectomy   . Hypertension   . Low iron   . Myocardial infarction [facility]) ?2006  . Renal cell carcinoma    dialysis- MWF- Dr. Mercy Moore follows.  . Renal insufficiency   . Shortness of breath 05/19/11   "at rest, lying down, w/exertion"  . Stroke [facility]) 02/2011   05/19/11 denies residual  . Umbilical hernia 46/56/81   unrepaired    Patient Active Problem  List   Diagnosis Date Noted  . Acute respiratory failure with hypoxemia ([place]) 07/17/2017  . 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  . Acute on chronic combined systolic and diastolic CHF, NYHA class 4 ([place]) 07/17/2017  . Unresponsiveness 03/16/2017  . Hypotension 03/16/2017  . Paroxysmal atrial flutter ([place]) 03/16/2017  . Chronic pain 03/16/2017  . Leg pain, bilateral 03/16/2017  . Bile leak, postoperative 03/15/2016  . Elevated troponin 03/15/2016  . Rib pain on right side   . Biliary dyskinesia 02/12/2016  . Atrial flutter with rapid ventricular response ([place]) 06/27/2015  . Diverticulitis 04/21/2015  . Acute diverticulitis 04/21/2015  . Diverticulitis of intestine without perforation or abscess without bleeding 04/21/2015  . Pulmonary edema 01/13/2015  . Right upper quadrant abdominal pain 01/13/2015  . Acute respiratory failure with hypoxia ([place]) 01/13/2015  . Diverticulitis large intestine w/o perforation or abscess w/o bleeding 12/18/2014  . Hepatic cyst 12/18/2014  . Aftercare following surgery of the circulatory system, [place] 06/22/2013  . ESRD (end stage renal disease) on dialysis ([place]) 11/28/2012  . Right sided weakness 10/05/2012  . History of nephrectomy 07/04/2012  . End-stage renal disease on hemodialysis ([place]) 06/04/2011  . Systolic CHF, chronic ([place]) 05/19/2011    Class: Acute  . Hypertension, benign 05/19/2011    Class: Chronic  . DM II (diabetes  mellitus, type II), controlled ([place]) 05/19/2011    Class: Chronic  . Gout, chronic 05/19/2011    Past Surgical History:  Procedure Laterality Date  . AV FISTULA PLACEMENT  03/29/2011   Procedure: ARTERIOVENOUS (AV) FISTULA CREATION;  Surgeon: Hinda Lenis, MD;  Location: [place];  Service: Vascular;  Laterality: Left;  LEFT RADIOCEPHALIC , Arteriovenous RJJOACZ([id number])  . CARDIAC CATHETERIZATION  05/21/11  . CARDIAC CATHETERIZATION N/A 03/16/2016   Procedure: Left Heart Cath and Coronary Angiography;  Surgeon: Dixie Dials,  MD;  Location: [place] CV LAB;  Service: Cardiovascular;  Laterality: N/A;  . CHOLECYSTECTOMY N/A 02/12/2016   Procedure: LAPAROSCOPIC CHOLECYSTECTOMY WITH INTRAOPERATIVE CHOLANGIOGRAM;  Surgeon: Jackolyn Confer, MD;  Location: [place];  Service: General;  Laterality: N/A;  . COLONOSCOPY WITH PROPOFOL N/A 02/01/2017   Procedure: COLONOSCOPY WITH PROPOFOL;  Surgeon: Juanita Craver, MD;  Location: [facility];  Service: Endoscopy;  Laterality: N/A;  . dialysis cath placed    . ESOPHAGOGASTRODUODENOSCOPY (EGD) WITH PROPOFOL N/A 12/02/2015   Procedure: ESOPHAGOGASTRODUODENOSCOPY (EGD) WITH PROPOFOL;  Surgeon: Juanita Craver, MD;  Location: [facility];  Service: Endoscopy;  Laterality: N/A;  . FINGER SURGERY     L pinkie finger- ORIF- /w remaining hardware - 1990's    . HERNIA REPAIR N/A 0/16/0109   Umbilical hernia repair  . INSERTION OF DIALYSIS CATHETER N/A 01/27/2016   Procedure: INSERTION OF DIALYSIS CATHETER;  Surgeon: Waynetta Sandy, MD;  Location: [place];  Service: Vascular;  Laterality: N/A;  . INSERTION OF MESH N/A 07/04/2012   Procedure: INSERTION OF MESH;  Surgeon: Madilyn Hook, DO;  Location: [place];  Service: General;  Laterality: N/A;  . LAPAROSCOPIC LYSIS OF ADHESIONS N/A 02/12/2016   Procedure: LAPAROSCOPIC LYSIS OF ADHESIONS;  Surgeon: Jackolyn Confer, MD;  Location: [place];  Service: General;  Laterality: N/A;  . LEFT AND RIGHT HEART CATHETERIZATION WITH CORONARY ANGIOGRAM N/A 08/04/2012   Procedure: LEFT AND RIGHT HEART CATHETERIZATION WITH CORONARY ANGIOGRAM;  Surgeon: Birdie Riddle, MD;  Location: [place] CATH LAB;  Service: Cardiovascular;  Laterality: N/A;  . NEPHRECTOMY  2000   right  . REVISON OF ARTERIOVENOUS FISTULA Left 06/05/2013   Procedure: REVISON OF LEFT RADIOCEPHALIC  ARTERIOVENOUS FISTULA;  Surgeon: Conrad Bossier, MD;  Location: [place];  Service: Vascular;  Laterality: Left;  . REVISON OF ARTERIOVENOUS FISTULA Left 01/27/2016   Procedure: REVISION OF LEFT UPPER EXTREMITY  ARTERIOVENOUS FISTULA;  Surgeon: Waynetta Sandy, MD;  Location: [place];  Service: Vascular;  Laterality: Left;  . REVISON OF ARTERIOVENOUS FISTULA Left 08/03/2016   Procedure: REVISON OF Left arm ARTERIOVENOUS FISTULA;  Surgeon: Elam Dutch, MD;  Location: [place];  Service: Vascular;  Laterality: Left;  . REVISON OF ARTERIOVENOUS FISTULA Left 05/06/2017   Procedure: REVISION PLICATION OF ARTERIOVENOUS FISTULA LEFT ARM;  Surgeon: Rosetta Posner, MD;  Location: [place];  Service: Vascular;  Laterality: Left;  . RIGHT HEART CATHETERIZATION N/A 05/21/2011   Procedure: RIGHT HEART CATH;  Surgeon: Birdie Riddle, MD;  Location: [facility] CATH LAB;  Service: Cardiovascular;  Laterality: N/A;  . smashed  1990's   "left pinky; have a plate in there"  . UMBILICAL HERNIA REPAIR N/A 07/04/2012   Procedure: LAPAROSCOPIC UMBILICAL HERNIA;  Surgeon: Madilyn Hook, DO;  Location: [place];  Service: General;  Laterality: N/A;  . US ECHOCARDIOGRAPHY  05/20/11       Home Medications    Prior to Admission medications   Medication Sig Start Date End Date Taking? Authorizing  Provider  allopurinol (ZYLOPRIM) 300 MG tablet Take 150 mg by mouth every other day.   Yes [provider]  amiodarone (PACERONE) 200 MG tablet Take 0.5 tablets (100 mg total) by mouth daily. 07/21/17  Yes Regalado, Belkys A, MD  calcium acetate (PHOSLO) 667 MG capsule Take 667-2,001 mg by mouth See admin instructions. Take three capsules (2001 mg) by mouth three times daily with meals and one capsule (667 mg) with snacks 01/17/17  Yes [provider]  lidocaine-prilocaine (EMLA) cream Apply 1 application topically See admin instructions. Apply topically to port access prior to dialysis - Monday, Wednesday, Friday 04/08/15  Yes [provider]  midodrine (PROAMATINE) 10 MG tablet Take 0.5 tablets (5 mg total) by mouth every Monday, Wednesday, and Friday with hemodialysis. Take on the morning of dialysis days Patient  taking differently: Take 5-10 mg by mouth See admin instructions. Take one tablet (10 mg) by mouth before dialysis (Monday, Wednesday, Friday), take 1/2 tablet (5 mg) on non-dialysis days (Sunday, Tuesday, Thursday, Saturday) as needed for diastolic blood pressure <40. 03/18/17  Yes Rai, Ripudeep K, MD  multivitamin (RENA-VIT) TABS tablet Take 1 tablet by mouth at bedtime.   Yes [provider]  nitroGLYCERIN (NITROSTAT) 0.4 MG SL tablet Place 0.4 mg under the tongue every 5 (five) minutes as needed for chest pain.  05/20/15  Yes [provider]  Nutritional Supplements (FEEDING SUPPLEMENT, NEPRO CARB STEADY,) LIQD Take 240 mLs by mouth 2 (two) times daily.   Yes [provider]  oxyCODONE-acetaminophen (PERCOCET/ROXICET) 5-325 MG tablet Take 2 tablets by mouth 2 (two) times daily as needed (gout pain).  02/17/17  Yes [provider]  pantoprazole (PROTONIX) 40 MG tablet Take 40 mg by mouth 2 (two) times daily.   Yes [provider]  warfarin (COUMADIN) 10 MG tablet Take 15 mg by mouth daily at 6 PM.   Yes [provider]  doxycycline (VIBRA-TABS) 100 MG tablet Take both pills at once 10/21/17   Raylene Everts, MD  ferric citrate (AURYXIA) 1 GM 210 MG(Fe) tablet Take 210 mg by mouth 3 (three) times daily with meals.     [provider]    Family History Family History  Problem Relation Age of Onset  . Hypertension Mother   . Kidney disease Mother     Social History Social History   Tobacco Use  . Smoking status: Never Smoker  . Smokeless tobacco: Never Used  Substance Use Topics  . Alcohol use: No    Alcohol/week: 0.0 oz  . Drug use: No    Types: Cocaine    Comment: Past hx-none in 20 yrs     Allergies   Ace inhibitors   Review of Systems Review of Systems  Constitutional: Negative for chills and fever.  HENT: Negative for ear pain and sore throat.   Eyes: Negative for pain and visual disturbance.  Respiratory:  Negative for cough and shortness of breath.   Cardiovascular: Negative for chest pain and palpitations.  Gastrointestinal: Negative for abdominal pain and vomiting.  Genitourinary: Negative for dysuria and hematuria.  Musculoskeletal: Negative for arthralgias and back pain.  Skin: Positive for wound. Negative for color change and rash.  Neurological: Negative for seizures and syncope.  All other systems reviewed and are negative.    Physical Exam Triage Vital Signs ED Triage Vitals  Enc Vitals Group     BP 10/21/17 1215 (!) 88/72     Pulse Rate 10/21/17 1212 (!) 103  Resp 10/21/17 1212 18     Temp 10/21/17 1212 98 F (36.7 C)     Temp src --      SpO2 10/21/17 1212 100 %     Weight --      Height --      Head Circumference --      Peak Flow --      Pain Score 10/21/17 1210 0     Pain Loc --      Pain Edu? --      Excl. in [place]? --    No data found.  Updated Vital Signs BP (!) 88/72   Pulse (!) 103   Temp 98 F (36.7 C)   Resp 18   SpO2 100%       Physical Exam  Constitutional: He appears well-developed and well-nourished. No distress.  HENT:  Head: Normocephalic and atraumatic.  Mouth/Throat: Oropharynx is clear and moist.  Eyes: Pupils are equal, round, and reactive to light. Conjunctivae are normal.  Neck: Normal range of motion.  Cardiovascular: Normal rate.  Pulmonary/Chest: Effort normal. No respiratory distress.  Abdominal: Soft. He exhibits no distension.  Musculoskeletal: Normal range of motion. He exhibits no edema.  Neurological: He is alert.  Skin: Skin is warm and dry.  Bandages on left forearm from dialysis.  Just under the left scapular tip there is a 2 cm erythematous nodule.  No tick parts remaining.  No bull's-eye.     UC Treatments / Results  Labs (all labs ordered are listed, but only abnormal results are displayed) Labs Reviewed - No data to display  EKG None  Radiology No results found.  Procedures Procedures (including  critical care time)  Medications Ordered in UC Medications - No data to display  Initial Impression / Assessment and Plan / UC Course  I have reviewed the triage vital signs and the nursing notes.  Pertinent labs & imaging results that were available during my care of the patient were reviewed by me and considered in my medical decision making (see chart for details).     Gust with patient that the likelihood of Lyme disease is low.  He was mowing the lawn yesterday and thinks that when he got the check.  Because there is uncertainty regarding how long the tick was in place, and because of the advice of his dialysis nurse, he is here expecting antibiotics. I told him that at the time of tick removal, a single 200 mg dose of doxycycline to prevent Lyme's.  We discussed possibility of GI distress.  We discussed that doxycycline can prolong his INR with Lyme disease.  I do not predict problems with a single dose. Final Clinical Impressions(s) / UC Diagnoses   Final diagnoses:  Tick bite, initial encounter     Discharge Instructions     Take the antibiotic as instructed I do not anticipate any problems   ED Prescriptions    Medication Sig Dispense Auth. Provider   doxycycline (VIBRA-TABS) 100 MG tablet Take both pills at once 2 tablet Meda Coffee Jennette Banker, MD     Controlled Substance Prescriptions [place] Controlled Substance Registry consulted? Not Applicable   Raylene Everts, MD 10/21/17 1310

## 2017-10-21 NOTE — ED Notes (Signed)
Pt states blood pressure always runs low after dialysis, pt reports not eating today. Provided patient with crackers and ginger ale.

## 2017-10-22 DIAGNOSIS — Z992 Dependence on renal dialysis: Secondary | ICD-10-CM | POA: Diagnosis not present

## 2017-10-22 DIAGNOSIS — N186 End stage renal disease: Secondary | ICD-10-CM | POA: Diagnosis not present

## 2017-10-22 DIAGNOSIS — I120 Hypertensive chronic kidney disease with stage 5 chronic kidney disease or end stage renal disease: Secondary | ICD-10-CM | POA: Diagnosis not present

## 2017-10-24 DIAGNOSIS — N2581 Secondary hyperparathyroidism of renal origin: Secondary | ICD-10-CM | POA: Diagnosis not present

## 2017-10-24 DIAGNOSIS — Z992 Dependence on renal dialysis: Secondary | ICD-10-CM | POA: Diagnosis not present

## 2017-10-24 DIAGNOSIS — E1129 Type 2 diabetes mellitus with other diabetic kidney complication: Secondary | ICD-10-CM | POA: Diagnosis not present

## 2017-10-24 DIAGNOSIS — N186 End stage renal disease: Secondary | ICD-10-CM | POA: Diagnosis not present

## 2017-10-26 DIAGNOSIS — N186 End stage renal disease: Secondary | ICD-10-CM | POA: Diagnosis not present

## 2017-10-26 DIAGNOSIS — E1129 Type 2 diabetes mellitus with other diabetic kidney complication: Secondary | ICD-10-CM | POA: Diagnosis not present

## 2017-10-26 DIAGNOSIS — Z992 Dependence on renal dialysis: Secondary | ICD-10-CM | POA: Diagnosis not present

## 2017-10-26 DIAGNOSIS — N2581 Secondary hyperparathyroidism of renal origin: Secondary | ICD-10-CM | POA: Diagnosis not present

## 2017-10-28 DIAGNOSIS — N2581 Secondary hyperparathyroidism of renal origin: Secondary | ICD-10-CM | POA: Diagnosis not present

## 2017-10-28 DIAGNOSIS — N186 End stage renal disease: Secondary | ICD-10-CM | POA: Diagnosis not present

## 2017-10-28 DIAGNOSIS — Z992 Dependence on renal dialysis: Secondary | ICD-10-CM | POA: Diagnosis not present

## 2017-10-28 DIAGNOSIS — E1129 Type 2 diabetes mellitus with other diabetic kidney complication: Secondary | ICD-10-CM | POA: Diagnosis not present

## 2017-10-31 DIAGNOSIS — N2581 Secondary hyperparathyroidism of renal origin: Secondary | ICD-10-CM | POA: Diagnosis not present

## 2017-10-31 DIAGNOSIS — E1129 Type 2 diabetes mellitus with other diabetic kidney complication: Secondary | ICD-10-CM | POA: Diagnosis not present

## 2017-10-31 DIAGNOSIS — N186 End stage renal disease: Secondary | ICD-10-CM | POA: Diagnosis not present

## 2017-10-31 DIAGNOSIS — Z992 Dependence on renal dialysis: Secondary | ICD-10-CM | POA: Diagnosis not present

## 2017-11-02 DIAGNOSIS — Z992 Dependence on renal dialysis: Secondary | ICD-10-CM | POA: Diagnosis not present

## 2017-11-02 DIAGNOSIS — N186 End stage renal disease: Secondary | ICD-10-CM | POA: Diagnosis not present

## 2017-11-02 DIAGNOSIS — N2581 Secondary hyperparathyroidism of renal origin: Secondary | ICD-10-CM | POA: Diagnosis not present

## 2017-11-02 DIAGNOSIS — E1129 Type 2 diabetes mellitus with other diabetic kidney complication: Secondary | ICD-10-CM | POA: Diagnosis not present

## 2017-11-04 DIAGNOSIS — N2581 Secondary hyperparathyroidism of renal origin: Secondary | ICD-10-CM | POA: Diagnosis not present

## 2017-11-04 DIAGNOSIS — Z992 Dependence on renal dialysis: Secondary | ICD-10-CM | POA: Diagnosis not present

## 2017-11-04 DIAGNOSIS — E1129 Type 2 diabetes mellitus with other diabetic kidney complication: Secondary | ICD-10-CM | POA: Diagnosis not present

## 2017-11-04 DIAGNOSIS — N186 End stage renal disease: Secondary | ICD-10-CM | POA: Diagnosis not present

## 2017-11-07 DIAGNOSIS — Z992 Dependence on renal dialysis: Secondary | ICD-10-CM | POA: Diagnosis not present

## 2017-11-07 DIAGNOSIS — E1129 Type 2 diabetes mellitus with other diabetic kidney complication: Secondary | ICD-10-CM | POA: Diagnosis not present

## 2017-11-07 DIAGNOSIS — N2581 Secondary hyperparathyroidism of renal origin: Secondary | ICD-10-CM | POA: Diagnosis not present

## 2017-11-07 DIAGNOSIS — N186 End stage renal disease: Secondary | ICD-10-CM | POA: Diagnosis not present

## 2017-11-09 DIAGNOSIS — E1129 Type 2 diabetes mellitus with other diabetic kidney complication: Secondary | ICD-10-CM | POA: Diagnosis not present

## 2017-11-09 DIAGNOSIS — Z992 Dependence on renal dialysis: Secondary | ICD-10-CM | POA: Diagnosis not present

## 2017-11-09 DIAGNOSIS — N186 End stage renal disease: Secondary | ICD-10-CM | POA: Diagnosis not present

## 2017-11-09 DIAGNOSIS — N2581 Secondary hyperparathyroidism of renal origin: Secondary | ICD-10-CM | POA: Diagnosis not present

## 2017-11-11 DIAGNOSIS — N186 End stage renal disease: Secondary | ICD-10-CM | POA: Diagnosis not present

## 2017-11-11 DIAGNOSIS — E1129 Type 2 diabetes mellitus with other diabetic kidney complication: Secondary | ICD-10-CM | POA: Diagnosis not present

## 2017-11-11 DIAGNOSIS — N2581 Secondary hyperparathyroidism of renal origin: Secondary | ICD-10-CM | POA: Diagnosis not present

## 2017-11-11 DIAGNOSIS — Z992 Dependence on renal dialysis: Secondary | ICD-10-CM | POA: Diagnosis not present

## 2017-11-14 DIAGNOSIS — E1129 Type 2 diabetes mellitus with other diabetic kidney complication: Secondary | ICD-10-CM | POA: Diagnosis not present

## 2017-11-14 DIAGNOSIS — N186 End stage renal disease: Secondary | ICD-10-CM | POA: Diagnosis not present

## 2017-11-14 DIAGNOSIS — Z992 Dependence on renal dialysis: Secondary | ICD-10-CM | POA: Diagnosis not present

## 2017-11-14 DIAGNOSIS — N2581 Secondary hyperparathyroidism of renal origin: Secondary | ICD-10-CM | POA: Diagnosis not present

## 2017-11-16 DIAGNOSIS — E1129 Type 2 diabetes mellitus with other diabetic kidney complication: Secondary | ICD-10-CM | POA: Diagnosis not present

## 2017-11-16 DIAGNOSIS — Z992 Dependence on renal dialysis: Secondary | ICD-10-CM | POA: Diagnosis not present

## 2017-11-16 DIAGNOSIS — N186 End stage renal disease: Secondary | ICD-10-CM | POA: Diagnosis not present

## 2017-11-16 DIAGNOSIS — N2581 Secondary hyperparathyroidism of renal origin: Secondary | ICD-10-CM | POA: Diagnosis not present

## 2017-11-18 DIAGNOSIS — Z992 Dependence on renal dialysis: Secondary | ICD-10-CM | POA: Diagnosis not present

## 2017-11-18 DIAGNOSIS — N2581 Secondary hyperparathyroidism of renal origin: Secondary | ICD-10-CM | POA: Diagnosis not present

## 2017-11-18 DIAGNOSIS — N186 End stage renal disease: Secondary | ICD-10-CM | POA: Diagnosis not present

## 2017-11-18 DIAGNOSIS — E1129 Type 2 diabetes mellitus with other diabetic kidney complication: Secondary | ICD-10-CM | POA: Diagnosis not present

## 2017-11-18 DIAGNOSIS — I4892 Unspecified atrial flutter: Secondary | ICD-10-CM | POA: Diagnosis not present

## 2017-11-21 DIAGNOSIS — I120 Hypertensive chronic kidney disease with stage 5 chronic kidney disease or end stage renal disease: Secondary | ICD-10-CM | POA: Diagnosis not present

## 2017-11-21 DIAGNOSIS — N186 End stage renal disease: Secondary | ICD-10-CM | POA: Diagnosis not present

## 2017-11-21 DIAGNOSIS — E1129 Type 2 diabetes mellitus with other diabetic kidney complication: Secondary | ICD-10-CM | POA: Diagnosis not present

## 2017-11-21 DIAGNOSIS — Z992 Dependence on renal dialysis: Secondary | ICD-10-CM | POA: Diagnosis not present

## 2017-11-21 DIAGNOSIS — D631 Anemia in chronic kidney disease: Secondary | ICD-10-CM | POA: Diagnosis not present

## 2017-11-21 DIAGNOSIS — N2581 Secondary hyperparathyroidism of renal origin: Secondary | ICD-10-CM | POA: Diagnosis not present

## 2017-11-23 DIAGNOSIS — N2581 Secondary hyperparathyroidism of renal origin: Secondary | ICD-10-CM | POA: Diagnosis not present

## 2017-11-23 DIAGNOSIS — N186 End stage renal disease: Secondary | ICD-10-CM | POA: Diagnosis not present

## 2017-11-23 DIAGNOSIS — Z992 Dependence on renal dialysis: Secondary | ICD-10-CM | POA: Diagnosis not present

## 2017-11-23 DIAGNOSIS — D631 Anemia in chronic kidney disease: Secondary | ICD-10-CM | POA: Diagnosis not present

## 2017-11-23 DIAGNOSIS — E1129 Type 2 diabetes mellitus with other diabetic kidney complication: Secondary | ICD-10-CM | POA: Diagnosis not present

## 2017-11-25 DIAGNOSIS — E1129 Type 2 diabetes mellitus with other diabetic kidney complication: Secondary | ICD-10-CM | POA: Diagnosis not present

## 2017-11-25 DIAGNOSIS — D631 Anemia in chronic kidney disease: Secondary | ICD-10-CM | POA: Diagnosis not present

## 2017-11-25 DIAGNOSIS — N186 End stage renal disease: Secondary | ICD-10-CM | POA: Diagnosis not present

## 2017-11-25 DIAGNOSIS — N2581 Secondary hyperparathyroidism of renal origin: Secondary | ICD-10-CM | POA: Diagnosis not present

## 2017-11-25 DIAGNOSIS — Z992 Dependence on renal dialysis: Secondary | ICD-10-CM | POA: Diagnosis not present

## 2017-11-28 DIAGNOSIS — Z992 Dependence on renal dialysis: Secondary | ICD-10-CM | POA: Diagnosis not present

## 2017-11-28 DIAGNOSIS — D631 Anemia in chronic kidney disease: Secondary | ICD-10-CM | POA: Diagnosis not present

## 2017-11-28 DIAGNOSIS — N2581 Secondary hyperparathyroidism of renal origin: Secondary | ICD-10-CM | POA: Diagnosis not present

## 2017-11-28 DIAGNOSIS — E1129 Type 2 diabetes mellitus with other diabetic kidney complication: Secondary | ICD-10-CM | POA: Diagnosis not present

## 2017-11-28 DIAGNOSIS — N186 End stage renal disease: Secondary | ICD-10-CM | POA: Diagnosis not present

## 2017-11-30 DIAGNOSIS — E1129 Type 2 diabetes mellitus with other diabetic kidney complication: Secondary | ICD-10-CM | POA: Diagnosis not present

## 2017-11-30 DIAGNOSIS — N186 End stage renal disease: Secondary | ICD-10-CM | POA: Diagnosis not present

## 2017-11-30 DIAGNOSIS — Z992 Dependence on renal dialysis: Secondary | ICD-10-CM | POA: Diagnosis not present

## 2017-11-30 DIAGNOSIS — N2581 Secondary hyperparathyroidism of renal origin: Secondary | ICD-10-CM | POA: Diagnosis not present

## 2017-11-30 DIAGNOSIS — D631 Anemia in chronic kidney disease: Secondary | ICD-10-CM | POA: Diagnosis not present

## 2017-12-02 DIAGNOSIS — Z992 Dependence on renal dialysis: Secondary | ICD-10-CM | POA: Diagnosis not present

## 2017-12-02 DIAGNOSIS — E1129 Type 2 diabetes mellitus with other diabetic kidney complication: Secondary | ICD-10-CM | POA: Diagnosis not present

## 2017-12-02 DIAGNOSIS — N186 End stage renal disease: Secondary | ICD-10-CM | POA: Diagnosis not present

## 2017-12-02 DIAGNOSIS — N2581 Secondary hyperparathyroidism of renal origin: Secondary | ICD-10-CM | POA: Diagnosis not present

## 2017-12-02 DIAGNOSIS — D631 Anemia in chronic kidney disease: Secondary | ICD-10-CM | POA: Diagnosis not present

## 2017-12-05 DIAGNOSIS — E1129 Type 2 diabetes mellitus with other diabetic kidney complication: Secondary | ICD-10-CM | POA: Diagnosis not present

## 2017-12-05 DIAGNOSIS — D631 Anemia in chronic kidney disease: Secondary | ICD-10-CM | POA: Diagnosis not present

## 2017-12-05 DIAGNOSIS — Z992 Dependence on renal dialysis: Secondary | ICD-10-CM | POA: Diagnosis not present

## 2017-12-05 DIAGNOSIS — N186 End stage renal disease: Secondary | ICD-10-CM | POA: Diagnosis not present

## 2017-12-05 DIAGNOSIS — N2581 Secondary hyperparathyroidism of renal origin: Secondary | ICD-10-CM | POA: Diagnosis not present

## 2017-12-07 DIAGNOSIS — N186 End stage renal disease: Secondary | ICD-10-CM | POA: Diagnosis not present

## 2017-12-07 DIAGNOSIS — E1129 Type 2 diabetes mellitus with other diabetic kidney complication: Secondary | ICD-10-CM | POA: Diagnosis not present

## 2017-12-07 DIAGNOSIS — Z992 Dependence on renal dialysis: Secondary | ICD-10-CM | POA: Diagnosis not present

## 2017-12-07 DIAGNOSIS — D631 Anemia in chronic kidney disease: Secondary | ICD-10-CM | POA: Diagnosis not present

## 2017-12-07 DIAGNOSIS — N2581 Secondary hyperparathyroidism of renal origin: Secondary | ICD-10-CM | POA: Diagnosis not present

## 2017-12-09 DIAGNOSIS — N2581 Secondary hyperparathyroidism of renal origin: Secondary | ICD-10-CM | POA: Diagnosis not present

## 2017-12-09 DIAGNOSIS — N186 End stage renal disease: Secondary | ICD-10-CM | POA: Diagnosis not present

## 2017-12-09 DIAGNOSIS — Z992 Dependence on renal dialysis: Secondary | ICD-10-CM | POA: Diagnosis not present

## 2017-12-09 DIAGNOSIS — E1129 Type 2 diabetes mellitus with other diabetic kidney complication: Secondary | ICD-10-CM | POA: Diagnosis not present

## 2017-12-09 DIAGNOSIS — D631 Anemia in chronic kidney disease: Secondary | ICD-10-CM | POA: Diagnosis not present

## 2017-12-12 DIAGNOSIS — D631 Anemia in chronic kidney disease: Secondary | ICD-10-CM | POA: Diagnosis not present

## 2017-12-12 DIAGNOSIS — Z992 Dependence on renal dialysis: Secondary | ICD-10-CM | POA: Diagnosis not present

## 2017-12-12 DIAGNOSIS — E1129 Type 2 diabetes mellitus with other diabetic kidney complication: Secondary | ICD-10-CM | POA: Diagnosis not present

## 2017-12-12 DIAGNOSIS — N2581 Secondary hyperparathyroidism of renal origin: Secondary | ICD-10-CM | POA: Diagnosis not present

## 2017-12-12 DIAGNOSIS — N186 End stage renal disease: Secondary | ICD-10-CM | POA: Diagnosis not present

## 2017-12-14 DIAGNOSIS — E1129 Type 2 diabetes mellitus with other diabetic kidney complication: Secondary | ICD-10-CM | POA: Diagnosis not present

## 2017-12-14 DIAGNOSIS — N2581 Secondary hyperparathyroidism of renal origin: Secondary | ICD-10-CM | POA: Diagnosis not present

## 2017-12-14 DIAGNOSIS — Z992 Dependence on renal dialysis: Secondary | ICD-10-CM | POA: Diagnosis not present

## 2017-12-14 DIAGNOSIS — N186 End stage renal disease: Secondary | ICD-10-CM | POA: Diagnosis not present

## 2017-12-14 DIAGNOSIS — I4892 Unspecified atrial flutter: Secondary | ICD-10-CM | POA: Diagnosis not present

## 2017-12-14 DIAGNOSIS — D631 Anemia in chronic kidney disease: Secondary | ICD-10-CM | POA: Diagnosis not present

## 2017-12-16 DIAGNOSIS — Z992 Dependence on renal dialysis: Secondary | ICD-10-CM | POA: Diagnosis not present

## 2017-12-16 DIAGNOSIS — E1129 Type 2 diabetes mellitus with other diabetic kidney complication: Secondary | ICD-10-CM | POA: Diagnosis not present

## 2017-12-16 DIAGNOSIS — N2581 Secondary hyperparathyroidism of renal origin: Secondary | ICD-10-CM | POA: Diagnosis not present

## 2017-12-16 DIAGNOSIS — D631 Anemia in chronic kidney disease: Secondary | ICD-10-CM | POA: Diagnosis not present

## 2017-12-16 DIAGNOSIS — N186 End stage renal disease: Secondary | ICD-10-CM | POA: Diagnosis not present

## 2017-12-19 DIAGNOSIS — Z992 Dependence on renal dialysis: Secondary | ICD-10-CM | POA: Diagnosis not present

## 2017-12-19 DIAGNOSIS — E1129 Type 2 diabetes mellitus with other diabetic kidney complication: Secondary | ICD-10-CM | POA: Diagnosis not present

## 2017-12-19 DIAGNOSIS — D631 Anemia in chronic kidney disease: Secondary | ICD-10-CM | POA: Diagnosis not present

## 2017-12-19 DIAGNOSIS — N2581 Secondary hyperparathyroidism of renal origin: Secondary | ICD-10-CM | POA: Diagnosis not present

## 2017-12-19 DIAGNOSIS — N186 End stage renal disease: Secondary | ICD-10-CM | POA: Diagnosis not present

## 2017-12-21 DIAGNOSIS — N2581 Secondary hyperparathyroidism of renal origin: Secondary | ICD-10-CM | POA: Diagnosis not present

## 2017-12-21 DIAGNOSIS — D631 Anemia in chronic kidney disease: Secondary | ICD-10-CM | POA: Diagnosis not present

## 2017-12-21 DIAGNOSIS — E1129 Type 2 diabetes mellitus with other diabetic kidney complication: Secondary | ICD-10-CM | POA: Diagnosis not present

## 2017-12-21 DIAGNOSIS — Z992 Dependence on renal dialysis: Secondary | ICD-10-CM | POA: Diagnosis not present

## 2017-12-21 DIAGNOSIS — N186 End stage renal disease: Secondary | ICD-10-CM | POA: Diagnosis not present

## 2017-12-22 DIAGNOSIS — Z992 Dependence on renal dialysis: Secondary | ICD-10-CM | POA: Diagnosis not present

## 2017-12-22 DIAGNOSIS — N186 End stage renal disease: Secondary | ICD-10-CM | POA: Diagnosis not present

## 2017-12-22 DIAGNOSIS — I120 Hypertensive chronic kidney disease with stage 5 chronic kidney disease or end stage renal disease: Secondary | ICD-10-CM | POA: Diagnosis not present

## 2017-12-23 DIAGNOSIS — N186 End stage renal disease: Secondary | ICD-10-CM | POA: Diagnosis not present

## 2017-12-23 DIAGNOSIS — N2581 Secondary hyperparathyroidism of renal origin: Secondary | ICD-10-CM | POA: Diagnosis not present

## 2017-12-23 DIAGNOSIS — D631 Anemia in chronic kidney disease: Secondary | ICD-10-CM | POA: Diagnosis not present

## 2017-12-26 DIAGNOSIS — N2581 Secondary hyperparathyroidism of renal origin: Secondary | ICD-10-CM | POA: Diagnosis not present

## 2017-12-26 DIAGNOSIS — N186 End stage renal disease: Secondary | ICD-10-CM | POA: Diagnosis not present

## 2017-12-26 DIAGNOSIS — D631 Anemia in chronic kidney disease: Secondary | ICD-10-CM | POA: Diagnosis not present

## 2017-12-28 DIAGNOSIS — N186 End stage renal disease: Secondary | ICD-10-CM | POA: Diagnosis not present

## 2017-12-28 DIAGNOSIS — N2581 Secondary hyperparathyroidism of renal origin: Secondary | ICD-10-CM | POA: Diagnosis not present

## 2017-12-28 DIAGNOSIS — D631 Anemia in chronic kidney disease: Secondary | ICD-10-CM | POA: Diagnosis not present

## 2017-12-30 DIAGNOSIS — D631 Anemia in chronic kidney disease: Secondary | ICD-10-CM | POA: Diagnosis not present

## 2017-12-30 DIAGNOSIS — N186 End stage renal disease: Secondary | ICD-10-CM | POA: Diagnosis not present

## 2017-12-30 DIAGNOSIS — N2581 Secondary hyperparathyroidism of renal origin: Secondary | ICD-10-CM | POA: Diagnosis not present

## 2018-01-02 DIAGNOSIS — N2581 Secondary hyperparathyroidism of renal origin: Secondary | ICD-10-CM | POA: Diagnosis not present

## 2018-01-02 DIAGNOSIS — D631 Anemia in chronic kidney disease: Secondary | ICD-10-CM | POA: Diagnosis not present

## 2018-01-02 DIAGNOSIS — N186 End stage renal disease: Secondary | ICD-10-CM | POA: Diagnosis not present

## 2018-01-04 DIAGNOSIS — N2581 Secondary hyperparathyroidism of renal origin: Secondary | ICD-10-CM | POA: Diagnosis not present

## 2018-01-04 DIAGNOSIS — D631 Anemia in chronic kidney disease: Secondary | ICD-10-CM | POA: Diagnosis not present

## 2018-01-04 DIAGNOSIS — N186 End stage renal disease: Secondary | ICD-10-CM | POA: Diagnosis not present

## 2018-01-06 DIAGNOSIS — N186 End stage renal disease: Secondary | ICD-10-CM | POA: Diagnosis not present

## 2018-01-06 DIAGNOSIS — D631 Anemia in chronic kidney disease: Secondary | ICD-10-CM | POA: Diagnosis not present

## 2018-01-06 DIAGNOSIS — N2581 Secondary hyperparathyroidism of renal origin: Secondary | ICD-10-CM | POA: Diagnosis not present

## 2018-01-09 DIAGNOSIS — D631 Anemia in chronic kidney disease: Secondary | ICD-10-CM | POA: Diagnosis not present

## 2018-01-09 DIAGNOSIS — N186 End stage renal disease: Secondary | ICD-10-CM | POA: Diagnosis not present

## 2018-01-09 DIAGNOSIS — N2581 Secondary hyperparathyroidism of renal origin: Secondary | ICD-10-CM | POA: Diagnosis not present

## 2018-01-10 DIAGNOSIS — N186 End stage renal disease: Secondary | ICD-10-CM | POA: Diagnosis not present

## 2018-01-10 DIAGNOSIS — E119 Type 2 diabetes mellitus without complications: Secondary | ICD-10-CM | POA: Diagnosis not present

## 2018-01-10 DIAGNOSIS — I120 Hypertensive chronic kidney disease with stage 5 chronic kidney disease or end stage renal disease: Secondary | ICD-10-CM | POA: Diagnosis not present

## 2018-01-10 DIAGNOSIS — Z7901 Long term (current) use of anticoagulants: Secondary | ICD-10-CM | POA: Diagnosis not present

## 2018-01-11 DIAGNOSIS — N2581 Secondary hyperparathyroidism of renal origin: Secondary | ICD-10-CM | POA: Diagnosis not present

## 2018-01-11 DIAGNOSIS — D631 Anemia in chronic kidney disease: Secondary | ICD-10-CM | POA: Diagnosis not present

## 2018-01-11 DIAGNOSIS — N186 End stage renal disease: Secondary | ICD-10-CM | POA: Diagnosis not present

## 2018-01-12 ENCOUNTER — Encounter (HOSPITAL_COMMUNITY): Payer: Self-pay | Admitting: *Deleted

## 2018-01-12 ENCOUNTER — Other Ambulatory Visit: Payer: Self-pay

## 2018-01-12 ENCOUNTER — Inpatient Hospital Stay (HOSPITAL_COMMUNITY)
Admission: EM | Admit: 2018-01-12 | Discharge: 2018-01-23 | DRG: 270 | Disposition: A | Discharge status: Home / Self Care | Payer: Medicare Other | Attending: Family Medicine | Admitting: Family Medicine

## 2018-01-12 DIAGNOSIS — T45515A Adverse effect of anticoagulants, initial encounter: Secondary | ICD-10-CM | POA: Diagnosis present

## 2018-01-12 DIAGNOSIS — D72829 Elevated white blood cell count, unspecified: Secondary | ICD-10-CM | POA: Diagnosis present

## 2018-01-12 DIAGNOSIS — E669 Obesity, unspecified: Secondary | ICD-10-CM | POA: Diagnosis present

## 2018-01-12 DIAGNOSIS — Z6834 Body mass index (BMI) 34.0-34.9, adult: Secondary | ICD-10-CM

## 2018-01-12 DIAGNOSIS — I132 Hypertensive heart and chronic kidney disease with heart failure and with stage 5 chronic kidney disease, or end stage renal disease: Secondary | ICD-10-CM | POA: Diagnosis present

## 2018-01-12 DIAGNOSIS — Z9049 Acquired absence of other specified parts of digestive tract: Secondary | ICD-10-CM

## 2018-01-12 DIAGNOSIS — K219 Gastro-esophageal reflux disease without esophagitis: Secondary | ICD-10-CM | POA: Diagnosis present

## 2018-01-12 DIAGNOSIS — R791 Abnormal coagulation profile: Secondary | ICD-10-CM | POA: Diagnosis present

## 2018-01-12 DIAGNOSIS — N280 Ischemia and infarction of kidney: Secondary | ICD-10-CM | POA: Diagnosis present

## 2018-01-12 DIAGNOSIS — I420 Dilated cardiomyopathy: Secondary | ICD-10-CM | POA: Diagnosis present

## 2018-01-12 DIAGNOSIS — Z841 Family history of disorders of kidney and ureter: Secondary | ICD-10-CM

## 2018-01-12 DIAGNOSIS — I5043 Acute on chronic combined systolic (congestive) and diastolic (congestive) heart failure: Secondary | ICD-10-CM | POA: Diagnosis present

## 2018-01-12 DIAGNOSIS — K4090 Unilateral inguinal hernia, without obstruction or gangrene, not specified as recurrent: Secondary | ICD-10-CM | POA: Diagnosis present

## 2018-01-12 DIAGNOSIS — R1084 Generalized abdominal pain: Secondary | ICD-10-CM | POA: Diagnosis not present

## 2018-01-12 DIAGNOSIS — Z8249 Family history of ischemic heart disease and other diseases of the circulatory system: Secondary | ICD-10-CM

## 2018-01-12 DIAGNOSIS — I4892 Unspecified atrial flutter: Secondary | ICD-10-CM | POA: Diagnosis present

## 2018-01-12 DIAGNOSIS — I509 Heart failure, unspecified: Secondary | ICD-10-CM

## 2018-01-12 DIAGNOSIS — Z905 Acquired absence of kidney: Secondary | ICD-10-CM

## 2018-01-12 DIAGNOSIS — Z992 Dependence on renal dialysis: Secondary | ICD-10-CM

## 2018-01-12 DIAGNOSIS — N281 Cyst of kidney, acquired: Secondary | ICD-10-CM

## 2018-01-12 DIAGNOSIS — E161 Other hypoglycemia: Secondary | ICD-10-CM | POA: Diagnosis not present

## 2018-01-12 DIAGNOSIS — Z8679 Personal history of other diseases of the circulatory system: Secondary | ICD-10-CM

## 2018-01-12 DIAGNOSIS — M109 Gout, unspecified: Secondary | ICD-10-CM | POA: Diagnosis present

## 2018-01-12 DIAGNOSIS — E875 Hyperkalemia: Secondary | ICD-10-CM | POA: Diagnosis present

## 2018-01-12 DIAGNOSIS — R52 Pain, unspecified: Secondary | ICD-10-CM | POA: Diagnosis not present

## 2018-01-12 DIAGNOSIS — K5730 Diverticulosis of large intestine without perforation or abscess without bleeding: Secondary | ICD-10-CM | POA: Diagnosis not present

## 2018-01-12 DIAGNOSIS — R310 Gross hematuria: Secondary | ICD-10-CM | POA: Diagnosis present

## 2018-01-12 DIAGNOSIS — N2889 Other specified disorders of kidney and ureter: Secondary | ICD-10-CM | POA: Diagnosis not present

## 2018-01-12 DIAGNOSIS — I9589 Other hypotension: Secondary | ICD-10-CM | POA: Diagnosis present

## 2018-01-12 DIAGNOSIS — Z85528 Personal history of other malignant neoplasm of kidney: Secondary | ICD-10-CM

## 2018-01-12 DIAGNOSIS — E8889 Other specified metabolic disorders: Secondary | ICD-10-CM | POA: Diagnosis present

## 2018-01-12 DIAGNOSIS — N186 End stage renal disease: Secondary | ICD-10-CM

## 2018-01-12 DIAGNOSIS — J9811 Atelectasis: Secondary | ICD-10-CM | POA: Diagnosis present

## 2018-01-12 DIAGNOSIS — D62 Acute posthemorrhagic anemia: Secondary | ICD-10-CM | POA: Diagnosis not present

## 2018-01-12 DIAGNOSIS — R58 Hemorrhage, not elsewhere classified: Secondary | ICD-10-CM

## 2018-01-12 DIAGNOSIS — H5461 Unqualified visual loss, right eye, normal vision left eye: Secondary | ICD-10-CM | POA: Diagnosis present

## 2018-01-12 DIAGNOSIS — E1122 Type 2 diabetes mellitus with diabetic chronic kidney disease: Secondary | ICD-10-CM | POA: Diagnosis present

## 2018-01-12 DIAGNOSIS — Z7901 Long term (current) use of anticoagulants: Secondary | ICD-10-CM

## 2018-01-12 DIAGNOSIS — I252 Old myocardial infarction: Secondary | ICD-10-CM

## 2018-01-12 DIAGNOSIS — R1032 Left lower quadrant pain: Secondary | ICD-10-CM | POA: Diagnosis not present

## 2018-01-12 DIAGNOSIS — R571 Hypovolemic shock: Secondary | ICD-10-CM | POA: Diagnosis not present

## 2018-01-12 DIAGNOSIS — R578 Other shock: Secondary | ICD-10-CM | POA: Diagnosis not present

## 2018-01-12 DIAGNOSIS — I2510 Atherosclerotic heart disease of native coronary artery without angina pectoris: Secondary | ICD-10-CM | POA: Diagnosis present

## 2018-01-12 DIAGNOSIS — N2581 Secondary hyperparathyroidism of renal origin: Secondary | ICD-10-CM | POA: Diagnosis present

## 2018-01-12 DIAGNOSIS — I5042 Chronic combined systolic (congestive) and diastolic (congestive) heart failure: Secondary | ICD-10-CM | POA: Diagnosis present

## 2018-01-12 DIAGNOSIS — I480 Paroxysmal atrial fibrillation: Secondary | ICD-10-CM | POA: Diagnosis present

## 2018-01-12 DIAGNOSIS — I959 Hypotension, unspecified: Secondary | ICD-10-CM | POA: Diagnosis present

## 2018-01-12 DIAGNOSIS — I110 Hypertensive heart disease with heart failure: Secondary | ICD-10-CM | POA: Diagnosis not present

## 2018-01-12 DIAGNOSIS — Z888 Allergy status to other drugs, medicaments and biological substances status: Secondary | ICD-10-CM

## 2018-01-12 DIAGNOSIS — N1330 Unspecified hydronephrosis: Secondary | ICD-10-CM | POA: Diagnosis present

## 2018-01-12 DIAGNOSIS — E11649 Type 2 diabetes mellitus with hypoglycemia without coma: Secondary | ICD-10-CM | POA: Diagnosis not present

## 2018-01-12 DIAGNOSIS — E162 Hypoglycemia, unspecified: Secondary | ICD-10-CM | POA: Diagnosis not present

## 2018-01-12 DIAGNOSIS — D631 Anemia in chronic kidney disease: Secondary | ICD-10-CM | POA: Diagnosis present

## 2018-01-12 DIAGNOSIS — Z8673 Personal history of transient ischemic attack (TIA), and cerebral infarction without residual deficits: Secondary | ICD-10-CM

## 2018-01-12 LAB — CBC
HCT: 41.8 % (ref 39.0–52.0)
Hemoglobin: 13.6 g/dL (ref 13.0–17.0)
MCH: 32.9 pg (ref 26.0–34.0)
MCHC: 32.5 g/dL (ref 30.0–36.0)
MCV: 101.2 fL — ABNORMAL HIGH (ref 78.0–100.0)
Platelets: 199 10*3/uL (ref 150–400)
RBC: 4.13 MIL/uL — ABNORMAL LOW (ref 4.22–5.81)
RDW: 15.5 % (ref 11.5–15.5)
WBC: 7 10*3/uL (ref 4.0–10.5)

## 2018-01-12 LAB — PROTIME-INR
INR: 4.59
Prothrombin Time: 43.1 seconds — ABNORMAL HIGH (ref 11.4–15.2)

## 2018-01-12 LAB — COMPREHENSIVE METABOLIC PANEL
ALT: 8 U/L (ref 0–44)
AST: 10 U/L — ABNORMAL LOW (ref 15–41)
Albumin: 3.1 g/dL — ABNORMAL LOW (ref 3.5–5.0)
Alkaline Phosphatase: 68 U/L (ref 38–126)
Anion gap: 14 (ref 5–15)
BUN: 39 mg/dL — ABNORMAL HIGH (ref 6–20)
CO2: 28 mmol/L (ref 22–32)
Calcium: 8.9 mg/dL (ref 8.9–10.3)
Chloride: 94 mmol/L — ABNORMAL LOW (ref 98–111)
Creatinine, Ser: 12.64 mg/dL — ABNORMAL HIGH (ref 0.61–1.24)
GFR calc Af Amer: 5 mL/min — ABNORMAL LOW (ref >60)
GFR calc non Af Amer: 4 mL/min — ABNORMAL LOW (ref >60)
Glucose, Bld: 104 mg/dL — ABNORMAL HIGH (ref 70–99)
Potassium: 4.7 mmol/L (ref 3.5–5.1)
Sodium: 136 mmol/L (ref 135–145)
Total Bilirubin: 0.9 mg/dL (ref 0.3–1.2)
Total Protein: 7.8 g/dL (ref 6.5–8.1)

## 2018-01-12 LAB — LIPASE, BLOOD: Lipase: 38 U/L (ref 11–51)

## 2018-01-12 MED ORDER — SODIUM CHLORIDE 0.9 % IV BOLUS
250.0000 mL | Freq: Once | INTRAVENOUS | Status: AC
  Administered 2018-01-12: 250 mL via INTRAVENOUS

## 2018-01-12 MED ORDER — HYDROMORPHONE HCL 1 MG/ML IJ SOLN
1.0000 mg | Freq: Once | INTRAMUSCULAR | Status: AC
  Administered 2018-01-12: 1 mg via INTRAVENOUS
  Filled 2018-01-12: qty 1

## 2018-01-12 MED ORDER — ONDANSETRON 4 MG PO TBDP
4.0000 mg | ORAL_TABLET | Freq: Once | ORAL | Status: AC
  Administered 2018-01-12: 4 mg via ORAL
  Filled 2018-01-12: qty 1

## 2018-01-12 MED ORDER — IOPAMIDOL (ISOVUE-300) INJECTION 61%
100.0000 mL | Freq: Once | INTRAVENOUS | Status: AC | PRN
  Administered 2018-01-13: 100 mL via INTRAVENOUS

## 2018-01-12 MED ORDER — OXYCODONE-ACETAMINOPHEN 5-325 MG PO TABS
1.0000 | ORAL_TABLET | ORAL | Status: DC | PRN
  Administered 2018-01-12: 1 via ORAL
  Filled 2018-01-12: qty 1

## 2018-01-12 MED ORDER — IOPAMIDOL (ISOVUE-300) INJECTION 61%
INTRAVENOUS | Status: AC
  Filled 2018-01-12: qty 100

## 2018-01-12 NOTE — ED Triage Notes (Addendum)
Pt is a MWF dialysis patient. Pt reports he has urinated in over 5 years. Today was squatting to pee and had a rush of bright red blood in his urine. Has had abd pain for the past couple of days that has progressed. C/o L flank around to abd. Also takes warfarin

## 2018-01-12 NOTE — ED Notes (Signed)
Ambulated to restroom  

## 2018-01-13 ENCOUNTER — Encounter (HOSPITAL_COMMUNITY): Payer: Self-pay | Admitting: Nephrology

## 2018-01-13 ENCOUNTER — Emergency Department (HOSPITAL_COMMUNITY): Payer: Medicare Other

## 2018-01-13 ENCOUNTER — Inpatient Hospital Stay (HOSPITAL_COMMUNITY): Payer: Medicare Other

## 2018-01-13 DIAGNOSIS — R791 Abnormal coagulation profile: Secondary | ICD-10-CM

## 2018-01-13 DIAGNOSIS — I420 Dilated cardiomyopathy: Secondary | ICD-10-CM | POA: Diagnosis not present

## 2018-01-13 DIAGNOSIS — Z8679 Personal history of other diseases of the circulatory system: Secondary | ICD-10-CM

## 2018-01-13 DIAGNOSIS — I120 Hypertensive chronic kidney disease with stage 5 chronic kidney disease or end stage renal disease: Secondary | ICD-10-CM | POA: Diagnosis not present

## 2018-01-13 DIAGNOSIS — E8889 Other specified metabolic disorders: Secondary | ICD-10-CM | POA: Diagnosis present

## 2018-01-13 DIAGNOSIS — I953 Hypotension of hemodialysis: Secondary | ICD-10-CM

## 2018-01-13 DIAGNOSIS — R58 Hemorrhage, not elsewhere classified: Secondary | ICD-10-CM | POA: Diagnosis not present

## 2018-01-13 DIAGNOSIS — I132 Hypertensive heart and chronic kidney disease with heart failure and with stage 5 chronic kidney disease, or end stage renal disease: Secondary | ICD-10-CM | POA: Diagnosis not present

## 2018-01-13 DIAGNOSIS — E669 Obesity, unspecified: Secondary | ICD-10-CM | POA: Diagnosis present

## 2018-01-13 DIAGNOSIS — R319 Hematuria, unspecified: Secondary | ICD-10-CM | POA: Diagnosis not present

## 2018-01-13 DIAGNOSIS — N1330 Unspecified hydronephrosis: Secondary | ICD-10-CM | POA: Diagnosis not present

## 2018-01-13 DIAGNOSIS — N2581 Secondary hyperparathyroidism of renal origin: Secondary | ICD-10-CM | POA: Diagnosis not present

## 2018-01-13 DIAGNOSIS — N186 End stage renal disease: Secondary | ICD-10-CM | POA: Diagnosis not present

## 2018-01-13 DIAGNOSIS — N2889 Other specified disorders of kidney and ureter: Secondary | ICD-10-CM | POA: Diagnosis not present

## 2018-01-13 DIAGNOSIS — I2510 Atherosclerotic heart disease of native coronary artery without angina pectoris: Secondary | ICD-10-CM | POA: Diagnosis not present

## 2018-01-13 DIAGNOSIS — I5043 Acute on chronic combined systolic (congestive) and diastolic (congestive) heart failure: Secondary | ICD-10-CM | POA: Diagnosis not present

## 2018-01-13 DIAGNOSIS — E875 Hyperkalemia: Secondary | ICD-10-CM | POA: Diagnosis present

## 2018-01-13 DIAGNOSIS — Z992 Dependence on renal dialysis: Secondary | ICD-10-CM | POA: Diagnosis not present

## 2018-01-13 DIAGNOSIS — R578 Other shock: Secondary | ICD-10-CM | POA: Diagnosis not present

## 2018-01-13 DIAGNOSIS — I509 Heart failure, unspecified: Secondary | ICD-10-CM | POA: Diagnosis not present

## 2018-01-13 DIAGNOSIS — R1032 Left lower quadrant pain: Secondary | ICD-10-CM | POA: Diagnosis not present

## 2018-01-13 DIAGNOSIS — D62 Acute posthemorrhagic anemia: Secondary | ICD-10-CM | POA: Diagnosis not present

## 2018-01-13 DIAGNOSIS — D631 Anemia in chronic kidney disease: Secondary | ICD-10-CM | POA: Diagnosis not present

## 2018-01-13 DIAGNOSIS — E119 Type 2 diabetes mellitus without complications: Secondary | ICD-10-CM | POA: Diagnosis not present

## 2018-01-13 DIAGNOSIS — I252 Old myocardial infarction: Secondary | ICD-10-CM | POA: Diagnosis not present

## 2018-01-13 DIAGNOSIS — E1122 Type 2 diabetes mellitus with diabetic chronic kidney disease: Secondary | ICD-10-CM | POA: Diagnosis not present

## 2018-01-13 DIAGNOSIS — D72829 Elevated white blood cell count, unspecified: Secondary | ICD-10-CM | POA: Diagnosis present

## 2018-01-13 DIAGNOSIS — I480 Paroxysmal atrial fibrillation: Secondary | ICD-10-CM | POA: Diagnosis present

## 2018-01-13 DIAGNOSIS — S37092A Other injury of left kidney, initial encounter: Secondary | ICD-10-CM | POA: Diagnosis not present

## 2018-01-13 DIAGNOSIS — I959 Hypotension, unspecified: Secondary | ICD-10-CM | POA: Diagnosis not present

## 2018-01-13 DIAGNOSIS — K5730 Diverticulosis of large intestine without perforation or abscess without bleeding: Secondary | ICD-10-CM | POA: Diagnosis not present

## 2018-01-13 DIAGNOSIS — H5461 Unqualified visual loss, right eye, normal vision left eye: Secondary | ICD-10-CM | POA: Diagnosis present

## 2018-01-13 DIAGNOSIS — R1030 Lower abdominal pain, unspecified: Secondary | ICD-10-CM | POA: Diagnosis not present

## 2018-01-13 DIAGNOSIS — J9811 Atelectasis: Secondary | ICD-10-CM | POA: Diagnosis not present

## 2018-01-13 DIAGNOSIS — I9589 Other hypotension: Secondary | ICD-10-CM | POA: Diagnosis present

## 2018-01-13 DIAGNOSIS — N280 Ischemia and infarction of kidney: Secondary | ICD-10-CM | POA: Diagnosis not present

## 2018-01-13 DIAGNOSIS — I4892 Unspecified atrial flutter: Secondary | ICD-10-CM | POA: Diagnosis present

## 2018-01-13 DIAGNOSIS — I482 Chronic atrial fibrillation: Secondary | ICD-10-CM | POA: Diagnosis not present

## 2018-01-13 DIAGNOSIS — R310 Gross hematuria: Secondary | ICD-10-CM | POA: Diagnosis not present

## 2018-01-13 DIAGNOSIS — R571 Hypovolemic shock: Secondary | ICD-10-CM | POA: Diagnosis not present

## 2018-01-13 HISTORY — PX: IR EMBO ART  VEN HEMORR LYMPH EXTRAV  INC GUIDE ROADMAPPING: IMG5450

## 2018-01-13 HISTORY — PX: IR RENAL SELECTIVE  UNI INC S&I MOD SED: IMG654

## 2018-01-13 HISTORY — PX: IR FLUORO GUIDE CV LINE RIGHT: IMG2283

## 2018-01-13 HISTORY — PX: IR US GUIDE VASC ACCESS RIGHT: IMG2390

## 2018-01-13 HISTORY — DX: Other specified disorders of kidney and ureter: N28.89

## 2018-01-13 LAB — CBC
HCT: 39.3 % (ref 39.0–52.0)
HCT: 30.7 % — ABNORMAL LOW (ref 39.0–52.0)
HCT: 36 % — ABNORMAL LOW (ref 39.0–52.0)
HCT: 35.3 % — ABNORMAL LOW (ref 39.0–52.0)
Hemoglobin: 12.5 g/dL — ABNORMAL LOW (ref 13.0–17.0)
Hemoglobin: 9.7 g/dL — ABNORMAL LOW (ref 13.0–17.0)
Hemoglobin: 11.6 g/dL — ABNORMAL LOW (ref 13.0–17.0)
Hemoglobin: 11.5 g/dL — ABNORMAL LOW (ref 13.0–17.0)
MCH: 32.3 pg (ref 26.0–34.0)
MCH: 32.9 pg (ref 26.0–34.0)
MCH: 31.7 pg (ref 26.0–34.0)
MCH: 31.6 pg (ref 26.0–34.0)
MCHC: 31.8 g/dL (ref 30.0–36.0)
MCHC: 31.6 g/dL (ref 30.0–36.0)
MCHC: 32.2 g/dL (ref 30.0–36.0)
MCHC: 32.6 g/dL (ref 30.0–36.0)
MCV: 101.6 fL — ABNORMAL HIGH (ref 78.0–100.0)
MCV: 104.1 fL — ABNORMAL HIGH (ref 78.0–100.0)
MCV: 98.4 fL (ref 78.0–100.0)
MCV: 97 fL (ref 78.0–100.0)
Platelets: 208 10*3/uL (ref 150–400)
Platelets: 244 10*3/uL (ref 150–400)
Platelets: 229 10*3/uL (ref 150–400)
Platelets: 258 10*3/uL (ref 150–400)
RBC: 3.87 MIL/uL — ABNORMAL LOW (ref 4.22–5.81)
RBC: 2.95 MIL/uL — ABNORMAL LOW (ref 4.22–5.81)
RBC: 3.66 MIL/uL — ABNORMAL LOW (ref 4.22–5.81)
RBC: 3.64 MIL/uL — ABNORMAL LOW (ref 4.22–5.81)
RDW: 15.5 % (ref 11.5–15.5)
RDW: 15.6 % — ABNORMAL HIGH (ref 11.5–15.5)
RDW: 17 % — ABNORMAL HIGH (ref 11.5–15.5)
RDW: 17.7 % — ABNORMAL HIGH (ref 11.5–15.5)
WBC: 7.8 10*3/uL (ref 4.0–10.5)
WBC: 11 10*3/uL — ABNORMAL HIGH (ref 4.0–10.5)
WBC: 22 10*3/uL — ABNORMAL HIGH (ref 4.0–10.5)
WBC: 17.6 10*3/uL — ABNORMAL HIGH (ref 4.0–10.5)

## 2018-01-13 LAB — RENAL FUNCTION PANEL
Albumin: 2.6 g/dL — ABNORMAL LOW (ref 3.5–5.0)
Anion gap: 20 — ABNORMAL HIGH (ref 5–15)
BUN: 49 mg/dL — ABNORMAL HIGH (ref 6–20)
CO2: 21 mmol/L — ABNORMAL LOW (ref 22–32)
Calcium: 8.3 mg/dL — ABNORMAL LOW (ref 8.9–10.3)
Chloride: 93 mmol/L — ABNORMAL LOW (ref 98–111)
Creatinine, Ser: 13.96 mg/dL — ABNORMAL HIGH (ref 0.61–1.24)
GFR calc Af Amer: 4 mL/min — ABNORMAL LOW (ref >60)
GFR calc non Af Amer: 3 mL/min — ABNORMAL LOW (ref >60)
Glucose, Bld: 228 mg/dL — ABNORMAL HIGH (ref 70–99)
Phosphorus: 7.3 mg/dL — ABNORMAL HIGH (ref 2.5–4.6)
Potassium: 5.9 mmol/L — ABNORMAL HIGH (ref 3.5–5.1)
Sodium: 134 mmol/L — ABNORMAL LOW (ref 135–145)

## 2018-01-13 LAB — PROTIME-INR
INR: 2.05
INR: 1.43
INR: 1.18
Prothrombin Time: 23 seconds — ABNORMAL HIGH (ref 11.4–15.2)
Prothrombin Time: 17.4 seconds — ABNORMAL HIGH (ref 11.4–15.2)
Prothrombin Time: 14.9 seconds (ref 11.4–15.2)

## 2018-01-13 LAB — PREPARE FRESH FROZEN PLASMA: Unit division: 0

## 2018-01-13 LAB — BPAM FFP
Blood Product Expiration Date: 201908242359
Blood Product Expiration Date: 201908242359
ISSUE DATE / TIME: 201908230926
ISSUE DATE / TIME: 201908230926
Unit Type and Rh: 6200
Unit Type and Rh: 6200

## 2018-01-13 LAB — TROPONIN I
Troponin I: 0.27 ng/mL (ref <0.03)
Troponin I: 0.21 ng/mL (ref <0.03)
Troponin I: 0.2 ng/mL (ref <0.03)

## 2018-01-13 LAB — I-STAT CHEM 8, ED
BUN: 44 mg/dL — ABNORMAL HIGH (ref 6–20)
Calcium, Ion: 0.98 mmol/L — ABNORMAL LOW (ref 1.15–1.40)
Chloride: 97 mmol/L — ABNORMAL LOW (ref 98–111)
Creatinine, Ser: 14 mg/dL — ABNORMAL HIGH (ref 0.61–1.24)
Glucose, Bld: 113 mg/dL — ABNORMAL HIGH (ref 70–99)
HCT: 44 % (ref 39.0–52.0)
Hemoglobin: 15 g/dL (ref 13.0–17.0)
Potassium: 5.4 mmol/L — ABNORMAL HIGH (ref 3.5–5.1)
Sodium: 131 mmol/L — ABNORMAL LOW (ref 135–145)
TCO2: 26 mmol/L (ref 22–32)

## 2018-01-13 LAB — PREPARE RBC (CROSSMATCH)

## 2018-01-13 LAB — GLUCOSE, CAPILLARY
Glucose-Capillary: 190 mg/dL — ABNORMAL HIGH (ref 70–99)
Glucose-Capillary: 159 mg/dL — ABNORMAL HIGH (ref 70–99)

## 2018-01-13 LAB — I-STAT TROPONIN, ED: Troponin i, poc: 0.04 ng/mL (ref 0.00–0.08)

## 2018-01-13 LAB — ABO/RH: ABO/RH(D): A POS

## 2018-01-13 LAB — MRSA PCR SCREENING: MRSA by PCR: NEGATIVE

## 2018-01-13 LAB — CBG MONITORING, ED: Glucose-Capillary: 94 mg/dL (ref 70–99)

## 2018-01-13 MED ORDER — SODIUM CHLORIDE 0.9% IV SOLUTION: Freq: Once | INTRAVENOUS | Status: DC

## 2018-01-13 MED ORDER — SODIUM CHLORIDE 0.9% FLUSH: 10.0000 mL | INTRAVENOUS | Status: DC | PRN

## 2018-01-13 MED ORDER — HYDROMORPHONE HCL 1 MG/ML IJ SOLN
0.5000 mg | Freq: Once | INTRAMUSCULAR | Status: AC
  Administered 2018-01-13: 0.25 mg via INTRAVENOUS

## 2018-01-13 MED ORDER — SODIUM CHLORIDE 0.9 % IV BOLUS
250.0000 mL | Freq: Once | INTRAVENOUS | Status: AC
  Administered 2018-01-13: 250 mL via INTRAVENOUS

## 2018-01-13 MED ORDER — HYDROMORPHONE HCL 1 MG/ML IJ SOLN
0.5000 mg | Freq: Once | INTRAMUSCULAR | Status: AC
  Administered 2018-01-13: 0.5 mg via INTRAVENOUS

## 2018-01-13 MED ORDER — HYDROMORPHONE HCL 1 MG/ML IJ SOLN
INTRAMUSCULAR | Status: AC
  Filled 2018-01-13: qty 1

## 2018-01-13 MED ORDER — MIDAZOLAM HCL 2 MG/2ML IJ SOLN
INTRAMUSCULAR | Status: AC | PRN
  Administered 2018-01-13: 0.5 mg via INTRAVENOUS

## 2018-01-13 MED ORDER — CHLORHEXIDINE GLUCONATE CLOTH 2 % EX PADS: 6.0000 | MEDICATED_PAD | Freq: Every day | CUTANEOUS | Status: DC

## 2018-01-13 MED ORDER — RENA-VITE PO TABS
1.0000 | ORAL_TABLET | Freq: Every day | ORAL | Status: DC
  Administered 2018-01-13 – 2018-01-22 (×10): 1 via ORAL
  Filled 2018-01-13 (×11): qty 1

## 2018-01-13 MED ORDER — MIDODRINE HCL 5 MG PO TABS: 5.0000 mg | ORAL_TABLET | ORAL | Status: DC

## 2018-01-13 MED ORDER — ONDANSETRON HCL 4 MG/2ML IJ SOLN
4.0000 mg | Freq: Once | INTRAMUSCULAR | Status: AC
  Administered 2018-01-13: 4 mg via INTRAVENOUS
  Filled 2018-01-13: qty 2

## 2018-01-13 MED ORDER — INSULIN ASPART 100 UNIT/ML ~~LOC~~ SOLN
0.0000 [IU] | SUBCUTANEOUS | Status: DC
  Administered 2018-01-13: 3 [IU] via SUBCUTANEOUS

## 2018-01-13 MED ORDER — ALLOPURINOL 300 MG PO TABS
150.0000 mg | ORAL_TABLET | ORAL | Status: DC
  Administered 2018-01-14 – 2018-01-22 (×5): 150 mg via ORAL
  Filled 2018-01-13: qty 1
  Filled 2018-01-13: qty 0.5
  Filled 2018-01-13 (×2): qty 1
  Filled 2018-01-13: qty 0.5

## 2018-01-13 MED ORDER — MIDAZOLAM HCL 2 MG/2ML IJ SOLN
INTRAMUSCULAR | Status: AC
  Filled 2018-01-13: qty 2

## 2018-01-13 MED ORDER — FENTANYL CITRATE (PF) 100 MCG/2ML IJ SOLN
25.0000 ug | INTRAMUSCULAR | Status: DC | PRN
  Administered 2018-01-13 (×2): 50 ug via INTRAVENOUS
  Filled 2018-01-13 (×2): qty 2

## 2018-01-13 MED ORDER — IOHEXOL 300 MG/ML  SOLN
300.0000 mL | Freq: Once | INTRAMUSCULAR | Status: AC | PRN
  Administered 2018-01-13: 70 mL via INTRA_ARTERIAL

## 2018-01-13 MED ORDER — PROTHROMBIN COMPLEX CONC HUMAN 500 UNITS IV KIT
2662.0000 [IU] | PACK | Status: AC
  Administered 2018-01-13: 2662 [IU] via INTRAVENOUS
  Filled 2018-01-13: qty 2662

## 2018-01-13 MED ORDER — SODIUM CHLORIDE 0.9% FLUSH
10.0000 mL | Freq: Two times a day (BID) | INTRAVENOUS | Status: DC
  Administered 2018-01-14 – 2018-01-22 (×11): 10 mL

## 2018-01-13 MED ORDER — PANTOPRAZOLE SODIUM 40 MG PO TBEC
40.0000 mg | DELAYED_RELEASE_TABLET | Freq: Two times a day (BID) | ORAL | Status: DC
  Administered 2018-01-13 – 2018-01-23 (×20): 40 mg via ORAL
  Filled 2018-01-13 (×20): qty 1

## 2018-01-13 MED ORDER — LIDOCAINE HCL 1 % IJ SOLN
INTRAMUSCULAR | Status: AC
  Filled 2018-01-13: qty 20

## 2018-01-13 MED ORDER — ONDANSETRON HCL 4 MG/2ML IJ SOLN
4.0000 mg | Freq: Four times a day (QID) | INTRAMUSCULAR | Status: DC | PRN
  Administered 2018-01-13 – 2018-01-14 (×4): 4 mg via INTRAVENOUS
  Filled 2018-01-13 (×4): qty 2

## 2018-01-13 MED ORDER — AMIODARONE HCL 200 MG PO TABS
100.0000 mg | ORAL_TABLET | Freq: Every day | ORAL | Status: DC
  Administered 2018-01-14 – 2018-01-23 (×10): 100 mg via ORAL
  Filled 2018-01-13 (×10): qty 1

## 2018-01-13 MED ORDER — SODIUM CHLORIDE 0.9 % IV SOLN
10.0000 mL/h | Freq: Once | INTRAVENOUS | Status: AC
  Administered 2018-01-13: 10 mL/h via INTRAVENOUS

## 2018-01-13 MED ORDER — FENTANYL CITRATE (PF) 100 MCG/2ML IJ SOLN
INTRAMUSCULAR | Status: AC
  Filled 2018-01-13: qty 2

## 2018-01-13 MED ORDER — VITAMIN K1 10 MG/ML IJ SOLN
10.0000 mg | Freq: Once | INTRAVENOUS | Status: AC
  Administered 2018-01-13: 10 mg via INTRAVENOUS
  Filled 2018-01-13: qty 1

## 2018-01-13 MED ORDER — LIDOCAINE HCL 1 % IJ SOLN
INTRAMUSCULAR | Status: AC | PRN
  Administered 2018-01-13: 5 mL

## 2018-01-13 MED ORDER — MIDODRINE HCL 5 MG PO TABS
10.0000 mg | ORAL_TABLET | ORAL | Status: DC
  Administered 2018-01-13: 10 mg via ORAL
  Filled 2018-01-13: qty 2

## 2018-01-13 MED ORDER — NOREPINEPHRINE 4 MG/250ML-% IV SOLN
0.0000 ug/min | INTRAVENOUS | Status: DC
  Administered 2018-01-13: 2 ug/min via INTRAVENOUS
  Administered 2018-01-14: 20 ug/min via INTRAVENOUS
  Administered 2018-01-14: 12 ug/min via INTRAVENOUS
  Administered 2018-01-14: 10 ug/min via INTRAVENOUS
  Administered 2018-01-14: 18 ug/min via INTRAVENOUS
  Administered 2018-01-14: 14 ug/min via INTRAVENOUS
  Filled 2018-01-13 (×5): qty 250

## 2018-01-13 MED ORDER — FENTANYL CITRATE (PF) 100 MCG/2ML IJ SOLN
25.0000 ug | INTRAMUSCULAR | Status: DC | PRN
  Administered 2018-01-13 (×2): 25 ug via INTRAVENOUS
  Administered 2018-01-14: 50 ug via INTRAVENOUS
  Administered 2018-01-14: 25 ug via INTRAVENOUS
  Administered 2018-01-14: 50 ug via INTRAVENOUS
  Filled 2018-01-13 (×6): qty 2

## 2018-01-13 MED ORDER — FENTANYL CITRATE (PF) 100 MCG/2ML IJ SOLN
50.0000 ug | Freq: Once | INTRAMUSCULAR | Status: AC
  Administered 2018-01-13: 50 ug via INTRAVENOUS

## 2018-01-13 MED ORDER — GELATIN ABSORBABLE 12-7 MM EX MISC
CUTANEOUS | Status: AC | PRN
  Administered 2018-01-13: 1 via TOPICAL

## 2018-01-13 MED ORDER — HYDROMORPHONE HCL 1 MG/ML IJ SOLN: 0.5000 mg | INTRAMUSCULAR | Status: DC | PRN

## 2018-01-13 MED ORDER — ACETAMINOPHEN 325 MG PO TABS
650.0000 mg | ORAL_TABLET | Freq: Four times a day (QID) | ORAL | Status: DC | PRN
  Administered 2018-01-13: 650 mg via ORAL
  Filled 2018-01-13: qty 2

## 2018-01-13 MED ORDER — HYDROMORPHONE HCL 1 MG/ML IJ SOLN
0.5000 mg | Freq: Once | INTRAMUSCULAR | Status: AC
  Administered 2018-01-13: 0.5 mg via INTRAVENOUS
  Filled 2018-01-13: qty 1

## 2018-01-13 MED ORDER — SODIUM CHLORIDE 0.9 % IV SOLN
INTRAVENOUS | Status: AC | PRN
  Administered 2018-01-13: 500 mL/h via INTRAVENOUS

## 2018-01-13 MED ORDER — MIDODRINE HCL 5 MG PO TABS
5.0000 mg | ORAL_TABLET | ORAL | Status: DC | PRN
  Filled 2018-01-13: qty 1

## 2018-01-13 MED ORDER — ACETAMINOPHEN 650 MG RE SUPP: 650.0000 mg | Freq: Four times a day (QID) | RECTAL | Status: DC | PRN

## 2018-01-13 MED ORDER — GELATIN ABSORBABLE 12-7 MM EX MISC
CUTANEOUS | Status: AC
  Filled 2018-01-13: qty 1

## 2018-01-13 MED ORDER — ONDANSETRON HCL 4 MG PO TABS: 4.0000 mg | ORAL_TABLET | Freq: Four times a day (QID) | ORAL | Status: DC | PRN

## 2018-01-13 MED ORDER — FERRIC CITRATE 1 GM 210 MG(FE) PO TABS
210.0000 mg | ORAL_TABLET | Freq: Three times a day (TID) | ORAL | Status: DC
  Administered 2018-01-13 – 2018-01-23 (×25): 210 mg via ORAL
  Filled 2018-01-13 (×28): qty 1

## 2018-01-13 NOTE — ED Notes (Signed)
Dr.Gardner paged, pt c/o severe pain, unrelieved with fentanyl

## 2018-01-13 NOTE — Progress Notes (Signed)
Patient having worsening back / flank pain despite 100 of fentanyl in past 45 mins.  HGB 12.6 this AM was 13.5 last evening on admission.  HR 289, BP 791R systolic.  Patient restless with colicky flank pain.  1) Trying dilaudid again in small doses to see if this helps. 2) Repeating CT to look for changes.

## 2018-01-13 NOTE — Progress Notes (Addendum)
Patient with diaphoresis that onset after first dose of dilaudid before CT scan, got worse after 2nd dose of dilaudid, symptoms onset BEFORE the FFP unit transfusion was started (just starting this now):  Istat HGB repeated, HGB actually reading 15 right now (13 a couple hours ago).  Vitals look good (BP actually looks on the high side for the patient at 886 systolic, patient often has to take midodrine).  No tachycardia.  No further hematuria.  Still having flank pain, and chest 'tightness' but this is very pleuritic and associated with taking deep breaths.  1) switch dilaudid to fentanyl in case this is a dilaudid rxn 2) will check trop just in case 3) will check repeat EKG - no obvious ischemic changes, certainly no STEMI on my read 4) No evidence of hemorrhagic shock at this time (see above).

## 2018-01-13 NOTE — ED Notes (Signed)
Pt ambulatory to restroom; denies dizziness at present. Urinal given to attempt sample

## 2018-01-13 NOTE — ED Notes (Signed)
Pt diaphoretic . cbg checked and placed on monitor.

## 2018-01-13 NOTE — Progress Notes (Signed)
PROGRESS NOTE  [name] GEX:[id number] DOB: May 17, 1961 DOA: 01/12/2018 PCP: Iona Beard, MD  HPI/Recap of past 24 hours:  Patient is in pain,  does not look comfortable, bp is dropping, heart rate is increasing Family at bedside  Assessment/Plan: Principal Problem:   Hemorrhage of left kidney Active Problems:   Systolic CHF, chronic ([place])   ESRD (end stage renal disease) on dialysis ([place])   Hypotension   History of atrial flutter  Spontaneous hemorrhage of left kidney in the setting of supratherapeutic INR -Patient denies recent history of trauma, he reported a few days of left flank pain and hematuria. -INR on presentation 4.59 -He received vitamin K 10 mg and 1 unit of FFP in the ED, INR is coming down, but his heart rate is increasing and blood pressures dropping, repeat CT scan showed "Extensive left perinephric hemorrhage with distortion of the renal parenchyma due to hemorrhage. Hemorrhage extends throughout the pararenal fascia with extension of the pararenal fascia and displacement of adjacent structures as noted. Fluid tracks from the left pararenal fascia inferiorly along the left pelvis to extend into a left inguinal hernia which earlier in the day contained only fat." -Additional IV access obtained in the ED, Kcentra ordered, I have also ordered 2 units PRBC.  Stat consult IR Case discussed with IR physician Dr. Barbie Banner.  Patient is brought down to IR immediately, during procedure patient blood pressure continued to drop, Levophed ordered.  Stat consult to critical care.  Patient needs to be admitted to critical care unit. -Case also discussed with urology Dr. Matilde Sprang who is going to See patient in consult   ESRD on HD MWF -Last HD on Wednesday -k on presentation is 5.4 -Case discussed with nephrology Dr. Sheran Fava who recommended repeat K at noon and likely patient will need to have dialysis this afternoon  H/o nephrectomy on the  right due to kidneys stone ( per  patient) and  Paroxysmal atrial flutter with prior history of stroke -He is currently in sinus rhythm on amiodarone -Has been on Coumadin for 2 years,  -Coumadin held  Code Status: full  Family Communication: patient and family  Disposition Plan: admit to ICU   Consultants:  Critical care  IR  Urology  Nephrology  Procedures:  Usual IR angiogram and possible embolization on 8/23  Antibiotics:  none   Objective: BP (!) 85/76   Pulse (!) 49   Temp 97.7 F (36.5 C) (Oral)   Resp (!) 31   Ht 6\' 1"  (1.854 m)   Wt 110.2 kg   SpO2 100%   BMI 32.05 kg/m   Intake/Output Summary (Last 24 hours) at 01/13/2018 0935 Last data filed at 01/13/2018 0102 Gross per 24 hour  Intake 747 ml  Output -  Net 747 ml   Filed Weights   01/13/18 0731  Weight: 110.2 kg    Exam: Patient is examined daily including today on 01/13/2018, exams remain the same as of yesterday except that has changed    General: Diaphoretic, does not look comfortable  Cardiovascular: Sinus tachycardia  Respiratory: Diminished at bases, no wheezing  Abdomen: Soft/ND/NT, positive BS, prior surgical scar to the right side of abdomen, well healed 1  Musculoskeletal: No Edema  Neuro: alert, oriented   Data Reviewed: Basic Metabolic Panel: Recent Labs  Lab 01/12/18 2159 01/13/18 0247  NA 136 131*  K 4.7 5.4*  CL 94* 97*  CO2 28  --   GLUCOSE 104* 113*  BUN 39* 44*  CREATININE  12.64* 14.00*  CALCIUM 8.9  --    Liver Function Tests: Recent Labs  Lab 01/12/18 2159  AST 10*  ALT 8  ALKPHOS 68  BILITOT 0.9  PROT 7.8  ALBUMIN 3.1*   Recent Labs  Lab 01/12/18 2159  LIPASE 38   No results for input(s): AMMONIA in the last 168 hours. CBC: Recent Labs  Lab 01/12/18 2159 01/13/18 0247 01/13/18 0526  WBC 7.0  --  7.8  HGB 13.6 15.0 12.5*  HCT 41.8 44.0 39.3  MCV 101.2*  --  101.6*  PLT 199  --  208   Cardiac Enzymes:   Recent Labs  Lab 01/13/18 0526  TROPONINI 0.27*    BNP (last 3 results) Recent Labs    07/18/17 0531  BNP 1,325.2*    ProBNP (last 3 results) No results for input(s): PROBNP in the last 8760 hours.  CBG: Recent Labs  Lab 01/13/18 0127  GLUCAP 94    No results found for this or any previous visit (from the past 240 hour(s)).   Studies: Ct Abdomen Pelvis W Contrast  Result Date: 01/13/2018 CLINICAL DATA:  Abdominal pain, diverticulitis suspected. Dialysis patient with episode of hematuria today, left abdominal pain. EXAM: CT ABDOMEN AND PELVIS WITH CONTRAST TECHNIQUE: Multidetector CT imaging of the abdomen and pelvis was performed using the standard protocol following bolus administration of intravenous contrast. CONTRAST:  133mL ISOVUE-300 IOPAMIDOL (ISOVUE-300) INJECTION 61% COMPARISON:  CT 02/22/2017 FINDINGS: Lower chest: Multi chamber cardiomegaly. Ground-glass and consolidative opacity in the dependent left lower lobe. No pleural fluid. Hepatobiliary: Small cyst in the left hepatic lobe, unchanged. No new hepatic lesion. Postcholecystectomy with small cystic duct remnant. No biliary dilatation. Pancreas: No ductal dilatation or inflammation. Spleen: Normal in size without focal abnormality. Splenule inferiorly. Adrenals/Urinary Tract: Normal adrenal glands. Post right nephrectomy without suspicious soft tissue density in the nephrectomy bed. There is an ovoid heterogeneous region in the upper left kidney measuring 3.5 x 4.5 cm that is new from prior exam, containing a punctate internal hyperdensity, axial image 26 series 3. Adjacent perinephric edema. Mild left hydroureteronephrosis containing high-density material. Otherwise left renal parenchymal atrophy and cystic changes of chronic renal disease. Urinary bladder completely decompressed. Stomach/Bowel: Colonic diverticulosis without diverticulitis. Small colonic stool burden. Normal appendix. No small bowel dilatation, obstruction or inflammation. Stomach is nondistended.  Vascular/Lymphatic: Aortic and branch atherosclerosis. No aneurysm. Multiple small retroperitoneal and upper abdominal lymph nodes not enlarged by size criteria. No bulky adenopathy. Reproductive: Prostate is unremarkable. Other: Fat containing umbilical hernia.  No ascites.  No free air. Musculoskeletal: Degenerative disc disease L4-L5. There are no acute or suspicious osseous abnormalities. IMPRESSION: 1. New 3.5 x 4.5 cm ovoid heterogeneous hyperdensity in the upper left kidney with surrounding stranding and small central focus of high-density material, concerning for acute bleed. Findings may be hematoma due to elevated INR versus underlying lesion such as renal cell carcinoma with hemorrhage. Additionally there is mild left hydroureteronephrosis with high-density material throughout the renal collecting system and ureter consistent with blood. 2. Colonic diverticulosis without diverticulitis. 3. Patchy opacity in the left lung base likely reactive atelectasis. 4.  Aortic Atherosclerosis (ICD10-I70.0). These results were called by telephone at the time of interpretation on 01/13/2018 at 12:44 am to Dr. Addison Lank , who verbally acknowledged these results. Electronically Signed   By: Jeb Levering M.D.   On: 01/13/2018 00:44   Ct Renal Stone Study  Result Date: 01/13/2018 CLINICAL DATA:  Left renal hemorrhage with increased pain EXAM: CT  ABDOMEN AND PELVIS WITHOUT CONTRAST TECHNIQUE: Multidetector CT imaging of the abdomen and pelvis was performed following the standard protocol without oral or IV contrast. COMPARISON:  January 13, 2018 study obtained earlier in the day FINDINGS: Lower chest: There is bibasilar atelectatic change. There are multiple foci coronary artery calcification. There is a small left pleural effusion. Hepatobiliary: There is an 8 mm probable cyst in the left lobe of the liver. No other focal liver lesions are evident. Gallbladder is absent. There is no biliary duct dilatation.  Pancreas: No pancreatic mass or inflammatory focus. The pancreas is displaced anteriorly due to large amount of hemorrhage within the left perinephric fascia. Spleen: No splenic lesions are identified. Spleen is displaced laterally due to extensive left perirenal hemorrhage. Adrenals/Urinary Tract: Adrenals appear unremarkable. There has been extensive progression of hemorrhage throughout the left kidney and perirenal region. The left kidney is edematous with extensive hemorrhage throughout the left kidney distorting renal architecture. There is extensive soft tissue stranding and increased attenuation fluid throughout the perinephric fascia causing expansion of the perinephric fascia and displacement of adjacent structures. There is mass effect on the adjacent left psoas muscle medially as well as displacement the pancreas anteriorly in the spleen laterally. Hemorrhage tracks throughout the renal parenchyma diffusely, best appreciated on sagittal and coronal images. There are several cystic areas within the left kidney as well as the site of original hematoma in the upper pole region, less well-defined than earlier in the day. The right kidney is absent with surgical clips in the right pararenal region. No mass lesions seen in the right retroperitoneum. Urinary bladder is midline and collapsed. No calculus is seen in the left kidney or ureter. Stomach/Bowel: There is no appreciable bowel wall or mesenteric thickening. Loops of bowel are displaced medially due to the large pararenal hemorrhage on the left. No bowel obstruction. No free air or portal venous air evident. Vascular/Lymphatic: There is aortoiliac atherosclerosis. No aneurysm evident. No adenopathy appreciable. Reproductive: Prostate and seminal vesicles are normal in size and contour. Other: Appendix appears normal. There is a ventral hernia containing only fat, stable. There is fat in each inguinal ring. Note that there is hemorrhage tracking from the  pararenal region along the left retroperitoneum into a small inguinal hernia on the left. No abscess evident in the abdomen pelvis. No ascites. Musculoskeletal: There is degenerative change in the lumbar spine with vacuum phenomenon at L4-5. No blastic or lytic bone lesions. No intramuscular lesions are evident. IMPRESSION: 1. Extensive left perinephric hemorrhage with distortion of the renal parenchyma due to hemorrhage. Hemorrhage extends throughout the pararenal fascia with extension of the pararenal fascia and displacement of adjacent structures as noted. Fluid tracks from the left pararenal fascia inferiorly along the left pelvis to extend into a left inguinal hernia which earlier in the day contained only fat. 2.  Right kidney absent.  No lesions seen in right pararenal fossa. 3. No bowel obstruction. No abscess in the abdomen or pelvis. Appendix appears unremarkable. 4.  There is aortoiliac atherosclerosis. 5.  Small ventral hernia containing only fat. 6.  Small left pleural effusion. Aortic Atherosclerosis (ICD10-I70.0). Critical Value/emergent results were called by telephone at the time of interpretation on 01/13/2018 at 7:52 am to emergency department physician covering for Dr. Alcario Drought, who verbally acknowledged these results. Electronically Signed   By: Lowella Grip III M.D.   On: 01/13/2018 07:52    Scheduled Meds: . sodium chloride   Intravenous Once  . sodium chloride   Intravenous  Once  . allopurinol  150 mg Oral QODAY  . amiodarone  100 mg Oral Daily  . fentaNYL      . ferric citrate  210 mg Oral TID WC  . lidocaine      . midazolam      . midodrine  5-10 mg Oral See admin instructions  . multivitamin  1 tablet Oral QHS  . pantoprazole  40 mg Oral BID    Continuous Infusions:   Total Critical Care time in examining the patient bedside, evaluating Lab work and other data, over half of the total time was spent in coordinating patient care on the floor or bedisde in talking to  patient/family members, communicating with nursing Staff on the floor and sub specialists critical care, IR, urology, nephrology , EDP to coordinate patients medical care and needs is 120 Minutes.  The condition which has caused critical injury/acute impairment of  vital organ system with a high probability of sudden clinically significant deterioration and can cause Potential Life threatening injury to this patient addressed today is spontaneous hemorrhaging from left kidney, hemodynamically unstable.  I have personally reviewed and interpreted on  01/13/2018 daily labs, tele strips, imagings as discussed above under date review session and assessment and plans.  I reviewed all nursing notes, pharmacy notes, consultant notes,  vitals, pertinent old records  I have discussed plan of care as described above with RN , patient and family on 01/13/2018   Florencia Reasons MD, PhD  [facility] Pager [phone]. If 7PM-7AM, please contact night-coverage at [email or web address], password [email or web address] 01/13/2018, 9:35 AM  LOS: 0 days

## 2018-01-13 NOTE — Consult Note (Addendum)
[name]  FWY:[id number] DOB: 04-27-1961 DOA: 01/12/2018 PCP: Iona Beard, MD    Reason for Consult / Chief Complaint:  Hemorrhagic shock  Consulting MD:  XU   HPI/Brief Narrative   This is a 57 year old aam w/ sig h/o ESRD (MWF HD), prior right nephrectomy, a flutter (on amio and warfarin), CHF and chronic hypotension (on midodrine). Usually anuric but went to the BR on 8/22 and voided BRB. DX eval in ER: large renal hematoma 3.5x5cm w/ mild hydro. Hgb 13.6 and INR was 4.59  on arrival. Admitted w/ left renal hemorrhage w/ significant associated flank pain. Received gentle IVFs, analgesia and Kcentra.  -8/23 While in IR undergoing embolization became progressively hypotensive and tachycardic. PCCM asked to consult emergently. Getting PRBCs x 2 w/ excellent hemodynamic response.   Assessment & Plan:  Hemorrhagic shock in setting of left renal hemorrhage (multiple sites bleeding, required embolization of entire renal blood flow to the left kidney emergently on 8/23) exacerbated by Coumadin induced coagulopathy -Cannot exclude underlying malignancy -Status post 2 units PRBC as well as Kcentra on 8/23 Plan Admit to the intensive care Serial CBC and coags Telemetry monitoring Norepinephrine for mean arterial pressure greater than 60 or systolic greater than 90 Urology consulted, may need further evaluation for possible malignancy down the road Holding warfarin PRN analgesia  Anemia of chronic disease.  Baseline hemoglobin 11.9.  Actually presented at 13.6.  Blood down to less than 10. Plan See above, holding anticoagulation. 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  He typically has dialysis on Monday Wednesday Friday.  EDW 113.5.   Plan Will alert nephrology of admission  Fluid and electrolyte imbalance: Mild hyperkalemia, Plan We will alert nephrology of admission.  He is due for dialysis today.  Diabetes type 2 Plan  Sliding scale insulin Hold oral agents    Best practice/Goals of care/disposition.   DVT prophylaxis: SCDs GI prophylaxis: ppi Diet: NPO Mobility:BR Code Status: full code  Family Communication: pending   Disposition / Summary of Today's Plan 01/13/18   Now s/p embolization of left kidney arterial blood flow. Hemodynamically stable s/p embolization and 2 units PRBC. Will transfer to ICU for post-op monitoring.   Consultants:  IR Urology Renal   Procedures: 8/23 IR   Significant Diagnostic Test:  CT abdomen pelvis 8/22: New 3.5 x 4.5 cm hypodensity in the left upper kidney with surrounding stranding and small central focus of high density material concerning for bleed underlying lesion not excluded.  Mild left hydroureteronephrosis.  Colonic diverticulosis without diverticulitis.  Mild left base atelectasis.   Micro Data:   Antimicrobials:     Subjective  Currently comfortable undergoing embolization  Objective    Examination: General: Awake alert oriented no focal deficits HENT: Normocephalic atraumatic no jugular venous distention Lungs: Clear to auscultation diminished bases Cardiovascular: Regular irregular tachycardic on telemetry atrial fibrillation noted Abdomen: Soft, tender left groin positive bowel sounds Extremities: Warm dry brisk capillary refill Neuro: Awake alert oriented GU: Not voiding currently typically an uric  Blood pressure (Abnormal) 81/56, pulse (Abnormal) 106, temperature 97.9 F (36.6 C), temperature source Oral,  resp. rate (Abnormal) 30, height 6\' 1"  (1.854 m), weight 110.2 kg, SpO2 100 %.        Intake/Output Summary (Last 24 hours) at 01/13/2018 1009 Last data filed at 01/13/2018 0955 Gross per 24 hour  Intake 1163.25 ml  Output no documentation  Net 1163.25 ml   Filed Weights   01/13/18 0731  Weight: 110.2 kg     Labs   CBC: Recent Labs  Lab 01/12/18 2159 01/13/18 0247 01/13/18 0526 01/13/18 0830  WBC 7.0  --  7.8 11.0*  HGB 13.6 15.0 12.5* 9.7*  HCT 41.8 44.0 39.3 30.7*  MCV 101.2*  --  101.6* 104.1*  PLT 199  --  208 376   Basic Metabolic Panel: Recent Labs  Lab 01/12/18 2159 01/13/18 0247  NA 136 131*  K 4.7 5.4*  CL 94* 97*  CO2 28  --   GLUCOSE 104* 113*  BUN 39* 44*  CREATININE 12.64* 14.00*  CALCIUM 8.9  --    GFR: Estimated Creatinine Clearance: 7.8 mL/min (A) (by C-G formula based on SCr of 14 mg/dL (H)). Recent Labs  Lab 01/12/18 2159 01/13/18 0526 01/13/18 0830  WBC 7.0 7.8 11.0*   Liver Function Tests: Recent Labs  Lab 01/12/18 2159  AST 10*  ALT 8  ALKPHOS 68  BILITOT 0.9  PROT 7.8  ALBUMIN 3.1*   Recent Labs  Lab 01/12/18 2159  LIPASE 38   No results for input(s): AMMONIA in the last 168 hours. ABG    Component Value Date/Time   PHART 7.631 (HH) 03/16/2017 0937   PCO2ART 26.2 (L) 03/16/2017 0937   PO2ART 81.0 (L) 03/16/2017 0937   HCO3 27.6 03/16/2017 0937   TCO2 26 01/13/2018 0247   ACIDBASEDEF 5.0 (H) 05/21/2011 0805   O2SAT 98.0 03/16/2017 0937    Coagulation Profile: Recent Labs  Lab 01/12/18 2159 01/13/18 0526 01/13/18 0830  INR 4.59* 2.05 1.43   Cardiac Enzymes: Recent Labs  Lab 01/13/18 0526  TROPONINI 0.27*   HbA1C: Hgb A1c MFr Bld  Date/Time Value Ref Range Status  01/13/2015 09:41 PM 5.4 4.8 - 5.6 % Final    Comment:    (NOTE)         Pre-diabetes: 5.7 - 6.4         Diabetes: >6.4         Glycemic control for adults with diabetes: <7.0   10/06/2012 04:10 AM 5.2 <5.7 % Final    Comment:     (NOTE)                                                                       According to the ADA Clinical Practice Recommendations for 2011, when HbA1c is used as a screening test:  >=6.5%   Diagnostic of Diabetes Mellitus           (if abnormal result is confirmed) 5.7-6.4%   Increased risk of developing Diabetes Mellitus References:Diagnosis and Classification of Diabetes Mellitus,Diabetes EGBT,5176,16(WVPXT 1):S62-S69 and Standards of Medical Care in         Diabetes - 2011,Diabetes Care,2011,34 (Suppl 1):S11-S61.   CBG: Recent Labs  Lab 01/13/18 0127  GLUCAP 94     Review of Systems:    Unable to provide, recently  received narcotics Past medical history   Past Medical History:  Diagnosis Date  . Atrial flutter ([place])   . 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 Cardiomyopathy   . CHF (congestive heart failure) ([place])   . Coronary artery disease    normal coronaries by 10/10/08 cath, Cardiac Cath 08-04-12 epic.Dr. Doylene Canard follows  . Diabetes mellitus    pt. states he's borderline diabetic., no longer taking med,- off med. since 2013  . Dialysis patient [facility])    Mon-Wed-Fri([place])- Left AV fistula  . Diverticulitis November 2016   reoccurred in December 2016  . ESRD (end stage renal disease) ([place])   . GERD (gastroesophageal reflux disease)   . Gout   . History of nephrectomy 07/04/2012   History of right nephrectomy in 2002 for renal cell carcinoma   . History of unilateral nephrectomy   . Hypertension   . Low iron   . Myocardial infarction [facility]) ?2006  . Renal cell carcinoma    dialysis- MWF- Dr. Mercy Moore follows.  . Renal insufficiency   . Shortness of breath 05/19/11   "at rest, lying down, w/exertion"  . Stroke [facility]) 02/2011   05/19/11 denies residual  . Umbilical hernia 56/38/75   unrepaired   Social History   Social History   Socioeconomic History  . Marital status: Married    Spouse name: Not on file  . Number  of children: Not on file  . Years of education: Not on file  . Highest education level: Not on file  Occupational History  . Not on file  Social Needs  . Financial resource strain: Not on file  . Food insecurity:    Worry: Not on file    Inability: Not on file  . Transportation needs:    Medical: Not on file    Non-medical: Not on file  Tobacco Use  . Smoking status: Never Smoker  . Smokeless tobacco: Never Used  Substance and Sexual Activity  . Alcohol use: No    Alcohol/week: 0.0 standard drinks  . Drug use: No    Types: Cocaine    Comment: Past hx-none in 20 yrs  . Sexual activity: Yes  Lifestyle  . Physical activity:    Days per week: Not on file    Minutes per session: Not on file  . Stress: Not on file  Relationships  . Social connections:    Talks on phone: Not on file    Gets together: Not on file    Attends religious service: Not on file    Active member of club or organization: Not on file    Attends meetings of clubs or organizations: Not on file    Relationship status: Not on file  . Intimate partner violence:    Fear of current or ex partner: Not on file    Emotionally abused: Not on file    Physically abused: Not on file    Forced sexual activity: Not on file  Other Topics Concern  . Not on file  Social History Narrative  . Not on file    Family history    Family History  Problem Relation Age of Onset  . Hypertension Mother   . Kidney disease Mother     Allergies Allergies  Allergen Reactions  . Ace Inhibitors Itching and Cough    Home meds  Prior to Admission medications   Medication Sig Start Date End Date Taking? Authorizing Provider  allopurinol (  ZYLOPRIM) 300 MG tablet Take 150 mg by mouth every other day.   Yes [provider]  amiodarone (PACERONE) 200 MG tablet Take 0.5 tablets (100 mg total) by mouth daily. 07/21/17  Yes Regalado, Belkys A, MD  ferric citrate (AURYXIA) 1 GM 210 MG(Fe) tablet Take 210 mg by mouth 3 (three)  times daily with meals.    Yes [provider]  lidocaine-prilocaine (EMLA) cream Apply 1 application topically See admin instructions. Apply topically to port access prior to dialysis - Monday, Wednesday, Friday 04/08/15  Yes [provider]  midodrine (PROAMATINE) 10 MG tablet Take 0.5 tablets (5 mg total) by mouth every Monday, Wednesday, and Friday with hemodialysis. Take on the morning of dialysis days Patient taking differently: Take 5-10 mg by mouth See admin instructions. Take one tablet (10 mg) by mouth before dialysis (Monday, Wednesday, Friday), take 1/2 tablet (5 mg) on non-dialysis days (Sunday, Tuesday, Thursday, Saturday) as needed for diastolic blood pressure <89. 03/18/17  Yes Rai, Ripudeep K, MD  multivitamin (RENA-VIT) TABS tablet Take 1 tablet by mouth at bedtime.   Yes [provider]  nitroGLYCERIN (NITROSTAT) 0.4 MG SL tablet Place 0.4 mg under the tongue every 5 (five) minutes as needed for chest pain.  05/20/15  Yes [provider]  pantoprazole (PROTONIX) 40 MG tablet Take 40 mg by mouth 2 (two) times daily.   Yes [provider]  warfarin (COUMADIN) 10 MG tablet Take 15 mg by mouth daily at 6 PM.   Yes [provider]     LOS: 0 days   Erick Colace ACNP-BC [place] Pager # [phone] OR # [phone] if no answer

## 2018-01-13 NOTE — H&P (Addendum)
History and Physical    [name] PIR:[id number] DOB: Jan 14, 1961 DOA: 01/12/2018  PCP: Iona Beard, MD  Patient coming from: Home  I have personally briefly reviewed patient's old medical records in [place]  Chief Complaint: Hematuria  HPI: [name] is a 57 y.o. male with medical history significant of ESRD dialysis on MWF, A.Flutter on amiodarone and coumadin, CHF, hypotension on midodrine.  Patient hasnt made urine in over 5 years he says.  Today however he went to bathroom and urinated BRB.  Has had abd pain for past couple of days that has progressed.  Nothing makes better or worse.   ED Course: INR 4.x.  CT abd pelvis shows L renal hemorrhage 3.5x4.5cm.  Mild left hydro with blood in collecting system.  HGB 13.6.  Hasnt urinated more blood since 9:30 PM (now nearly 1:30 AM)  EDP ordered 10mg  vit K and 1u FFP.   Review of Systems: As per HPI otherwise 10 point review of systems negative.   Past Medical History:  Diagnosis Date  . Atrial flutter ([place])   . 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 Cardiomyopathy   . CHF (congestive heart failure) ([place])   . Coronary artery disease    normal coronaries by 10/10/08 cath, Cardiac Cath 08-04-12 epic.Dr. Doylene Canard follows  . Diabetes mellitus    pt. states he's borderline diabetic., no longer taking med,- off med. since 2013  . Dialysis patient [facility])    Mon-Wed-Fri([place])- Left AV fistula  . Diverticulitis November 2016   reoccurred in December 2016  . ESRD (end stage renal disease) ([place])   . GERD (gastroesophageal reflux disease)   . Gout   . History of nephrectomy 07/04/2012   History of right nephrectomy in 2002 for renal cell carcinoma   . History of unilateral nephrectomy   . Hypertension   . Low iron   . Myocardial infarction [facility]) ?2006  . Renal cell carcinoma    dialysis- MWF- Dr. Mercy Moore follows.  . Renal insufficiency   . Shortness of breath 05/19/11   "at  rest, lying down, w/exertion"  . Stroke [facility]) 02/2011   05/19/11 denies residual  . Umbilical hernia 63/01/60   unrepaired    Past Surgical History:  Procedure Laterality Date  . AV FISTULA PLACEMENT  03/29/2011   Procedure: ARTERIOVENOUS (AV) FISTULA CREATION;  Surgeon: Hinda Lenis, MD;  Location: [place];  Service: Vascular;  Laterality: Left;  LEFT RADIOCEPHALIC , Arteriovenous FUXNATF([id number])  . CARDIAC CATHETERIZATION  05/21/11  . CARDIAC CATHETERIZATION N/A 03/16/2016   Procedure: Left Heart Cath and Coronary Angiography;  Surgeon: Dixie Dials, MD;  Location: [place] CV LAB;  Service: Cardiovascular;  Laterality: N/A;  . CHOLECYSTECTOMY N/A 02/12/2016   Procedure: LAPAROSCOPIC CHOLECYSTECTOMY WITH INTRAOPERATIVE CHOLANGIOGRAM;  Surgeon: Jackolyn Confer, MD;  Location: [place];  Service: General;  Laterality: N/A;  . COLONOSCOPY WITH PROPOFOL N/A 02/01/2017   Procedure: COLONOSCOPY WITH PROPOFOL;  Surgeon: Juanita Craver, MD;  Location: [facility] ENDOS[facility];  Service: Endoscopy;  Laterality: N/A;  . dialysis cath placed    . ESOPHAGOGASTRODUODENOSCOPY (EGD) WITH PROPOFOL N/A 12/02/2015   Procedure: ESOPHAGOGASTRODUODENOSCOPY (EGD) WITH PROPOFOL;  Surgeon: Juanita Craver, MD;  Location: [facility];  Service: Endoscopy;  Laterality: N/A;  . FINGER SURGERY     L pinkie finger- ORIF- /w remaining hardware - 1990's    . HERNIA REPAIR N/A 0/25/4270   Umbilical hernia repair  . INSERTION OF DIALYSIS CATHETER  N/A 01/27/2016   Procedure: INSERTION OF DIALYSIS CATHETER;  Surgeon: Waynetta Sandy, MD;  Location: [place];  Service: Vascular;  Laterality: N/A;  . INSERTION OF MESH N/A 07/04/2012   Procedure: INSERTION OF MESH;  Surgeon: Madilyn Hook, DO;  Location: [place];  Service: General;  Laterality: N/A;  . LAPAROSCOPIC LYSIS OF ADHESIONS N/A 02/12/2016   Procedure: LAPAROSCOPIC LYSIS OF ADHESIONS;  Surgeon: Jackolyn Confer, MD;  Location: [place];  Service: General;  Laterality: N/A;  . LEFT  AND RIGHT HEART CATHETERIZATION WITH CORONARY ANGIOGRAM N/A 08/04/2012   Procedure: LEFT AND RIGHT HEART CATHETERIZATION WITH CORONARY ANGIOGRAM;  Surgeon: Birdie Riddle, MD;  Location: [place] CATH LAB;  Service: Cardiovascular;  Laterality: N/A;  . NEPHRECTOMY  2000   right  . REVISON OF ARTERIOVENOUS FISTULA Left 06/05/2013   Procedure: REVISON OF LEFT RADIOCEPHALIC  ARTERIOVENOUS FISTULA;  Surgeon: Conrad Naranjito, MD;  Location: [place];  Service: Vascular;  Laterality: Left;  . REVISON OF ARTERIOVENOUS FISTULA Left 01/27/2016   Procedure: REVISION OF LEFT UPPER EXTREMITY ARTERIOVENOUS FISTULA;  Surgeon: Waynetta Sandy, MD;  Location: [place];  Service: Vascular;  Laterality: Left;  . REVISON OF ARTERIOVENOUS FISTULA Left 08/03/2016   Procedure: REVISON OF Left arm ARTERIOVENOUS FISTULA;  Surgeon: Elam Dutch, MD;  Location: [place];  Service: Vascular;  Laterality: Left;  . REVISON OF ARTERIOVENOUS FISTULA Left 05/06/2017   Procedure: REVISION PLICATION OF ARTERIOVENOUS FISTULA LEFT ARM;  Surgeon: Rosetta Posner, MD;  Location: [place];  Service: Vascular;  Laterality: Left;  . RIGHT HEART CATHETERIZATION N/A 05/21/2011   Procedure: RIGHT HEART CATH;  Surgeon: Birdie Riddle, MD;  Location: [facility] CATH LAB;  Service: Cardiovascular;  Laterality: N/A;  . smashed  1990's   "left pinky; have a plate in there"  . UMBILICAL HERNIA REPAIR N/A 07/04/2012   Procedure: LAPAROSCOPIC UMBILICAL HERNIA;  Surgeon: Madilyn Hook, DO;  Location: [place];  Service: General;  Laterality: N/A;  . US ECHOCARDIOGRAPHY  05/20/11     reports that he has never smoked. He has never used smokeless tobacco. He reports that he does not drink alcohol or use drugs.  Allergies  Allergen Reactions  . Ace Inhibitors Itching and Cough    Family History  Problem Relation Age of Onset  . Hypertension Mother   . Kidney disease Mother      Prior to Admission medications   Medication Sig Start Date End Date Taking? Authorizing  Provider  allopurinol (ZYLOPRIM) 300 MG tablet Take 150 mg by mouth every other day.   Yes [provider]  amiodarone (PACERONE) 200 MG tablet Take 0.5 tablets (100 mg total) by mouth daily. 07/21/17  Yes Regalado, Belkys A, MD  ferric citrate (AURYXIA) 1 GM 210 MG(Fe) tablet Take 210 mg by mouth 3 (three) times daily with meals.    Yes [provider]  lidocaine-prilocaine (EMLA) cream Apply 1 application topically See admin instructions. Apply topically to port access prior to dialysis - Monday, Wednesday, Friday 04/08/15  Yes [provider]  midodrine (PROAMATINE) 10 MG tablet Take 0.5 tablets (5 mg total) by mouth every Monday, Wednesday, and Friday with hemodialysis. Take on the morning of dialysis days Patient taking differently: Take 5-10 mg by mouth See admin instructions. Take one tablet (10 mg) by mouth before dialysis (Monday, Wednesday, Friday), take 1/2 tablet (5 mg) on non-dialysis days (Sunday, Tuesday, Thursday, Saturday) as needed for diastolic blood pressure <13. 03/18/17  Yes Rai, Ripudeep K,  MD  multivitamin (RENA-VIT) TABS tablet Take 1 tablet by mouth at bedtime.   Yes [provider]  nitroGLYCERIN (NITROSTAT) 0.4 MG SL tablet Place 0.4 mg under the tongue every 5 (five) minutes as needed for chest pain.  05/20/15  Yes [provider]  pantoprazole (PROTONIX) 40 MG tablet Take 40 mg by mouth 2 (two) times daily.   Yes [provider]  warfarin (COUMADIN) 10 MG tablet Take 15 mg by mouth daily at 6 PM.   Yes [provider]    Physical Exam: Vitals:   01/12/18 2145  BP: 133/84  Pulse: (!) 104  Resp: 18  Temp: 97.9 F (36.6 C)  SpO2: 99%    Constitutional: NAD, calm, comfortable Eyes: PERRL, lids and conjunctivae normal ENMT: Mucous membranes are moist. Posterior pharynx clear of any exudate or lesions.Normal dentition.  Neck: normal, supple, no masses, no thyromegaly Respiratory: clear to auscultation  bilaterally, no wheezing, no crackles. Normal respiratory effort. No accessory muscle use.  Cardiovascular: Regular rate and rhythm, no murmurs / rubs / gallops. No extremity edema. 2+ pedal pulses. No carotid bruits.  Abdomen: no tenderness, no masses palpated. No hepatosplenomegaly. Bowel sounds positive.  Musculoskeletal: no clubbing / cyanosis. No joint deformity upper and lower extremities. Good ROM, no contractures. Normal muscle tone.  Skin: no rashes, lesions, ulcers. No induration Neurologic: CN 2-12 grossly intact. Sensation intact, DTR normal. Strength 5/5 in all 4.  Psychiatric: Normal judgment and insight. Alert and oriented x 3. Normal mood.    Labs on Admission: I have personally reviewed following labs and imaging studies  CBC: Recent Labs  Lab 01/12/18 2159  WBC 7.0  HGB 13.6  HCT 41.8  MCV 101.2*  PLT 235   Basic Metabolic Panel: Recent Labs  Lab 01/12/18 2159  NA 136  K 4.7  CL 94*  CO2 28  GLUCOSE 104*  BUN 39*  CREATININE 12.64*  CALCIUM 8.9   GFR: CrCl cannot be calculated (Unknown ideal weight.). Liver Function Tests: Recent Labs  Lab 01/12/18 2159  AST 10*  ALT 8  ALKPHOS 68  BILITOT 0.9  PROT 7.8  ALBUMIN 3.1*   Recent Labs  Lab 01/12/18 2159  LIPASE 38   No results for input(s): AMMONIA in the last 168 hours. Coagulation Profile: Recent Labs  Lab 01/12/18 2159  INR 4.59*   Cardiac Enzymes: No results for input(s): CKTOTAL, CKMB, CKMBINDEX, TROPONINI in the last 168 hours. BNP (last 3 results) No results for input(s): PROBNP in the last 8760 hours. HbA1C: No results for input(s): HGBA1C in the last 72 hours. CBG: No results for input(s): GLUCAP in the last 168 hours. Lipid Profile: No results for input(s): CHOL, HDL, LDLCALC, TRIG, CHOLHDL, LDLDIRECT in the last 72 hours. Thyroid Function Tests: No results for input(s): TSH, T4TOTAL, FREET4, T3FREE, THYROIDAB in the last 72 hours. Anemia Panel: No results for input(s):  VITAMINB12, FOLATE, FERRITIN, TIBC, IRON, RETICCTPCT in the last 72 hours. Urine analysis:    Component Value Date/Time   COLORURINE YELLOW 05/19/2011 [place] 05/19/2011 1042   LABSPEC 1.013 05/19/2011 1042   PHURINE 6.5 05/19/2011 1042   GLUCOSEU NEGATIVE 05/19/2011 1042   HGBUR TRACE (A) 05/19/2011 1042   BILIRUBINUR NEGATIVE 05/19/2011 1042   KETONESUR NEGATIVE 05/19/2011 1042   PROTEINUR >300 (A) 05/19/2011 1042   UROBILINOGEN 0.2 05/19/2011 1042   NITRITE NEGATIVE 05/19/2011 1042   LEUKOCYTESUR NEGATIVE 05/19/2011 1042    Radiological Exams on Admission: Ct Abdomen Pelvis  W Contrast  Result Date: 01/13/2018 CLINICAL DATA:  Abdominal pain, diverticulitis suspected. Dialysis patient with episode of hematuria today, left abdominal pain. EXAM: CT ABDOMEN AND PELVIS WITH CONTRAST TECHNIQUE: Multidetector CT imaging of the abdomen and pelvis was performed using the standard protocol following bolus administration of intravenous contrast. CONTRAST:  111mL ISOVUE-300 IOPAMIDOL (ISOVUE-300) INJECTION 61% COMPARISON:  CT 02/22/2017 FINDINGS: Lower chest: Multi chamber cardiomegaly. Ground-glass and consolidative opacity in the dependent left lower lobe. No pleural fluid. Hepatobiliary: Small cyst in the left hepatic lobe, unchanged. No new hepatic lesion. Postcholecystectomy with small cystic duct remnant. No biliary dilatation. Pancreas: No ductal dilatation or inflammation. Spleen: Normal in size without focal abnormality. Splenule inferiorly. Adrenals/Urinary Tract: Normal adrenal glands. Post right nephrectomy without suspicious soft tissue density in the nephrectomy bed. There is an ovoid heterogeneous region in the upper left kidney measuring 3.5 x 4.5 cm that is new from prior exam, containing a punctate internal hyperdensity, axial image 26 series 3. Adjacent perinephric edema. Mild left hydroureteronephrosis containing high-density material. Otherwise left renal parenchymal  atrophy and cystic changes of chronic renal disease. Urinary bladder completely decompressed. Stomach/Bowel: Colonic diverticulosis without diverticulitis. Small colonic stool burden. Normal appendix. No small bowel dilatation, obstruction or inflammation. Stomach is nondistended. Vascular/Lymphatic: Aortic and branch atherosclerosis. No aneurysm. Multiple small retroperitoneal and upper abdominal lymph nodes not enlarged by size criteria. No bulky adenopathy. Reproductive: Prostate is unremarkable. Other: Fat containing umbilical hernia.  No ascites.  No free air. Musculoskeletal: Degenerative disc disease L4-L5. There are no acute or suspicious osseous abnormalities. IMPRESSION: 1. New 3.5 x 4.5 cm ovoid heterogeneous hyperdensity in the upper left kidney with surrounding stranding and small central focus of high-density material, concerning for acute bleed. Findings may be hematoma due to elevated INR versus underlying lesion such as renal cell carcinoma with hemorrhage. Additionally there is mild left hydroureteronephrosis with high-density material throughout the renal collecting system and ureter consistent with blood. 2. Colonic diverticulosis without diverticulitis. 3. Patchy opacity in the left lung base likely reactive atelectasis. 4.  Aortic Atherosclerosis (ICD10-I70.0). These results were called by telephone at the time of interpretation on 01/13/2018 at 12:44 am to Dr. Addison Lank , who verbally acknowledged these results. Electronically Signed   By: Jeb Levering M.D.   On: 01/13/2018 00:44    EKG: Independently reviewed.  Assessment/Plan Principal Problem:   Hemorrhage of left kidney Active Problems:   Systolic CHF, chronic (HCC)   ESRD (end stage renal disease) on dialysis (H[facility])   Hypotension   History of atrial flutter    1. Hemorrhage of L kidney - 1. Getting 10u vit K and 1u FFP ordered by EDP for reversal.  Will just stick with this for the moment (though 4u total FFP would  likely be needed for complete immediate reversal) since vitals and HGB stable, dont want to do too much volume if avoidable in this dialysis patient with CHF and no further hematuria. 2. Tele monitor 3. Repeat CBC in AM 4. If bleeding worsens then give additional units of FFP and call IR for embolization. 5. Dilaudid PRN pain 6. Repeat INR in AM 2. ESRD - call nephrology in AM for routine IP dialysis 3. Hypotension - continue midodrine 4. H/o A.Flutter - continue amiodarone, hold coumadin  DVT prophylaxis: SCDs Code Status: Full Family Communication: Family at bedside Disposition Plan: Home after admit Consults called: None Admission status: Admit to inpatient   Etta Quill DO [facility] Pager [phone] Only works nights!  If 7AM-7PM, please contact the primary day team physician taking care of patient  [email or web address] Password [email or web address]  01/13/2018, 1:17 AM

## 2018-01-13 NOTE — ED Notes (Signed)
Pt taken to CT.

## 2018-01-13 NOTE — Consult Note (Signed)
Urology Consult  Referring physician: Dr Dillard Cannon  Reason for referral: renal bleed unstable  Chief Complaint: renal bleed unstable   History of Present Illness: Male patient with ESRD ca,e in last night with left flank pain and gross hematuria; no urine output chronically until gross hematuria last 24 hours; INR abnormal on coumadin partially reversed last night; acute worsening of flank pain in ER; BP dropped to approx. 568 systolic from 127; Hb decreased one point; reversed and transfused and being sent to IR as I approached bedside;   Previous right nephrectomy for stones;   CTscan New 3.5 x 4.5 cm ovoid heterogeneous hyperdensity in the upper left kidney with surrounding stranding and small central focus of high-density material, concerning for acute bleed. Findings may be hematoma due to elevated INR versus underlying lesion such as renal cell carcinoma with hemorrhage. Additionally there is mild left hydroureteronephrosis with high-density material throughout the renal collecting system and ureter consistent with blood.  Second scan reviewed with progrression  Modifying factors: There are no other modifying factors  Associated signs and symptoms: There are no other associated signs and symptoms Aggravating and relieving factors: There are no other aggravating or relieving factors Severity: Moderate Duration: Persistent  Past Medical History:  Diagnosis Date  . Atrial flutter ([place])   . 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 Cardiomyopathy   . CHF (congestive heart failure) ([place])   . Coronary artery disease    normal coronaries by 10/10/08 cath, Cardiac Cath 08-04-12 epic.Dr. Doylene Canard follows  . Diabetes mellitus    pt. states he's borderline diabetic., no longer taking med,- off med. since 2013  . Dialysis patient [facility])    Mon-Wed-Fri([place])- Left AV fistula  . Diverticulitis November 2016   reoccurred in December 2016  . ESRD (end stage  renal disease) ([place])   . GERD (gastroesophageal reflux disease)   . Gout   . History of nephrectomy 07/04/2012   History of right nephrectomy in 2002 for renal cell carcinoma   . History of unilateral nephrectomy   . Hypertension   . Low iron   . Myocardial infarction [facility]) ?2006  . Renal cell carcinoma    dialysis- MWF- Dr. Mercy Moore follows.  . Renal insufficiency   . Shortness of breath 05/19/11   "at rest, lying down, w/exertion"  . Stroke [facility]) 02/2011   05/19/11 denies residual  . Umbilical hernia 51/70/01   unrepaired   Past Surgical History:  Procedure Laterality Date  . AV FISTULA PLACEMENT  03/29/2011   Procedure: ARTERIOVENOUS (AV) FISTULA CREATION;  Surgeon: Hinda Lenis, MD;  Location: [place];  Service: Vascular;  Laterality: Left;  LEFT RADIOCEPHALIC , Arteriovenous [id number])  . CARDIAC CATHETERIZATION  05/21/11  . CARDIAC CATHETERIZATION N/A 03/16/2016   Procedure: Left Heart Cath and Coronary Angiography;  Surgeon: Dixie Dials, MD;  Location: [place] CV LAB;  Service: Cardiovascular;  Laterality: N/A;  . CHOLECYSTECTOMY N/A 02/12/2016   Procedure: LAPAROSCOPIC CHOLECYSTECTOMY WITH INTRAOPERATIVE CHOLANGIOGRAM;  Surgeon: Jackolyn Confer, MD;  Location: [place];  Service: General;  Laterality: N/A;  . COLONOSCOPY WITH PROPOFOL N/A 02/01/2017   Procedure: COLONOSCOPY WITH PROPOFOL;  Surgeon: Juanita Craver, MD;  Location: [facility];  Service: Endoscopy;  Laterality: N/A;  . dialysis cath placed    . ESOPHAGOGASTRODUODENOSCOPY (EGD) WITH PROPOFOL N/A 12/02/2015   Procedure: ESOPHAGOGASTRODUODENOSCOPY (EGD) WITH PROPOFOL;  Surgeon: Juanita Craver, MD;  Location: [facility];  Service: Endoscopy;  Laterality: N/A;  .  FINGER SURGERY     L pinkie finger- ORIF- /w remaining hardware - 1990's    . HERNIA REPAIR N/A 5/36/1443   Umbilical hernia repair  . INSERTION OF DIALYSIS CATHETER N/A 01/27/2016   Procedure: INSERTION OF DIALYSIS CATHETER;  Surgeon: Waynetta Sandy, MD;  Location: [place];  Service: Vascular;  Laterality: N/A;  . INSERTION OF MESH N/A 07/04/2012   Procedure: INSERTION OF MESH;  Surgeon: Madilyn Hook, DO;  Location: [place];  Service: General;  Laterality: N/A;  . LAPAROSCOPIC LYSIS OF ADHESIONS N/A 02/12/2016   Procedure: LAPAROSCOPIC LYSIS OF ADHESIONS;  Surgeon: Jackolyn Confer, MD;  Location: [place];  Service: General;  Laterality: N/A;  . LEFT AND RIGHT HEART CATHETERIZATION WITH CORONARY ANGIOGRAM N/A 08/04/2012   Procedure: LEFT AND RIGHT HEART CATHETERIZATION WITH CORONARY ANGIOGRAM;  Surgeon: Birdie Riddle, MD;  Location: [place] CATH LAB;  Service: Cardiovascular;  Laterality: N/A;  . NEPHRECTOMY  2000   right  . REVISON OF ARTERIOVENOUS FISTULA Left 06/05/2013   Procedure: REVISON OF LEFT RADIOCEPHALIC  ARTERIOVENOUS FISTULA;  Surgeon: Conrad [place], MD;  Location: [place];  Service: Vascular;  Laterality: Left;  . REVISON OF ARTERIOVENOUS FISTULA Left 01/27/2016   Procedure: REVISION OF LEFT UPPER EXTREMITY ARTERIOVENOUS FISTULA;  Surgeon: Waynetta Sandy, MD;  Location: [place];  Service: Vascular;  Laterality: Left;  . REVISON OF ARTERIOVENOUS FISTULA Left 08/03/2016   Procedure: REVISON OF Left arm ARTERIOVENOUS FISTULA;  Surgeon: Elam Dutch, MD;  Location: [place];  Service: Vascular;  Laterality: Left;  . REVISON OF ARTERIOVENOUS FISTULA Left 05/06/2017   Procedure: REVISION PLICATION OF ARTERIOVENOUS FISTULA LEFT ARM;  Surgeon: Rosetta Posner, MD;  Location: [place];  Service: Vascular;  Laterality: Left;  . RIGHT HEART CATHETERIZATION N/A 05/21/2011   Procedure: RIGHT HEART CATH;  Surgeon: Birdie Riddle, MD;  Location: [facility] CATH LAB;  Service: Cardiovascular;  Laterality: N/A;  . smashed  1990's   "left pinky; have a plate in there"  . UMBILICAL HERNIA REPAIR N/A 07/04/2012   Procedure: LAPAROSCOPIC UMBILICAL HERNIA;  Surgeon: Madilyn Hook, DO;  Location: [place];  Service: General;  Laterality: N/A;  . US  ECHOCARDIOGRAPHY  05/20/11    Medications: I have reviewed the patient's current medications. Allergies:  Allergies  Allergen Reactions  . Ace Inhibitors Itching and Cough    Family History  Problem Relation Age of Onset  . Hypertension Mother   . Kidney disease Mother    Social History:  reports that he has never smoked. He has never used smokeless tobacco. He reports that he does not drink alcohol or use drugs.  ROS: All systems are reviewed and negative except as noted. Rest negative  Physical Exam:  Vital signs in last 24 hours: Temp:  [97.4 F (36.3 C)-97.9 F (36.6 C)] 97.6 F (36.4 C) (08/23 0343) Pulse Rate:  [49-113] 55 (08/23 0746) Resp:  [14-28] 14 (08/23 0845) BP: (97-163)/(65-112) 104/78 (08/23 0845) SpO2:  [91 %-100 %] 100 % (08/23 0845) Weight:  [110.2 kg] 110.2 kg (08/23 0731)  Cardiovascular: Skin warm; not flushed Respiratory: Breaths quiet; no shortness of breath Abdomen: No masses Neurological: Normal sensation to touch Musculoskeletal: Normal motor function arms and legs Lymphatics: No inguinal adenopathy Skin: No rashes Genitourinary:in distress with rt subcostal incision  Laboratory Data:  Results for orders placed or performed during the hospital encounter of 01/12/18 (from the past 72 hour(s))  Lipase, blood     Status: None  Collection Time: 01/12/18  9:59 PM  Result Value Ref Range   Lipase 38 11 - 51 U/L    Comment: Performed at [facility], [place] [address], [place], [place] [address]  Comprehensive metabolic panel     Status: Abnormal   Collection Time: 01/12/18  9:59 PM  Result Value Ref Range   Sodium 136 135 - 145 mmol/L   Potassium 4.7 3.5 - 5.1 mmol/L   Chloride 94 (L) 98 - 111 mmol/L   CO2 28 22 - 32 mmol/L   Glucose, Bld 104 (H) 70 - 99 mg/dL   BUN 39 (H) 6 - 20 mg/dL   Creatinine, Ser 12.64 (H) 0.61 - 1.24 mg/dL   Calcium 8.9 8.9 - 10.3 mg/dL   Total Protein 7.8 6.5 - 8.1 g/dL   Albumin 3.1 (L) 3.5 - 5.0 g/dL    AST 10 (L) 15 - 41 U/L   ALT 8 0 - 44 U/L   Alkaline Phosphatase 68 38 - 126 U/L   Total Bilirubin 0.9 0.3 - 1.2 mg/dL   GFR calc non Af Amer 4 (L) >60 mL/min   GFR calc Af Amer 5 (L) >60 mL/min    Comment: (NOTE) The eGFR has been calculated using the CKD EPI equation. This calculation has not been validated in all clinical situations. eGFR's persistently <60 mL/min signify possible Chronic Kidney Disease.    Anion gap 14 5 - 15    Comment: Performed at [facility] [address], [place], [place] [address]  CBC     Status: Abnormal   Collection Time: 01/12/18  9:59 PM  Result Value Ref Range   WBC 7.0 4.0 - 10.5 K/uL   RBC 4.13 (L) 4.22 - 5.81 MIL/uL   Hemoglobin 13.6 13.0 - 17.0 g/dL   HCT 41.8 39.0 - 52.0 %   MCV 101.2 (H) 78.0 - 100.0 fL   MCH 32.9 26.0 - 34.0 pg   MCHC 32.5 30.0 - 36.0 g/dL   RDW 15.5 11.5 - 15.5 %   Platelets 199 150 - 400 K/uL    Comment: Performed at [facility] [address], [place], [place] [address]  Protime-INR     Status: Abnormal   Collection Time: 01/12/18  9:59 PM  Result Value Ref Range   Prothrombin Time 43.1 (H) 11.4 - 15.2 seconds   INR 4.59 (HH)     Comment: REPEATED TO VERIFY CRITICAL RESULT CALLED TO, READ BACK BY AND VERIFIED WITHBartholome Bill RN [phone] SHORTT Performed at [facility], [place] [address] [place], [place] [address]   Prepare fresh frozen plasma     Status: None (Preliminary result)   Collection Time: 01/13/18 12:57 AM  Result Value Ref Range   Unit Number [id number]    Blood Component Type THAWED PLASMA    Unit division 00    Status of Unit ISSUED    Transfusion Status      OK TO TRANSFUSE Performed at [facility] [address], [place], [place] [address]   Type and screen [place]     Status: None (Preliminary result)   Collection Time: 01/13/18  1:21 AM  Result Value Ref Range   ABO/RH(D) A POS    Antibody Screen NEG    Sample Expiration 01/16/2018     Unit Number [id number]    Blood Component Type RED CELLS,LR    Unit division 00    Status of  Unit ISSUED    Transfusion Status OK TO TRANSFUSE    Crossmatch Result      Compatible Performed at [facility], [place] [address], [place], [place] [address]    Unit Number [id number]    Blood Component Type RED CELLS,LR    Unit division 00    Status of Unit ALLOCATED    Transfusion Status OK TO TRANSFUSE    Crossmatch Result Compatible   ABO/Rh     Status: None (Preliminary result)   Collection Time: 01/13/18  1:21 AM  Result Value Ref Range   ABO/RH(D)      A POS Performed at [facility], [place] [address] [place], [place] [address]   CBG monitoring, ED     Status: None   Collection Time: 01/13/18  1:27 AM  Result Value Ref Range   Glucose-Capillary 94 70 - 99 mg/dL  I-Stat Chem 8, ED     Status: Abnormal   Collection Time: 01/13/18  2:47 AM  Result Value Ref Range   Sodium 131 (L) 135 - 145 mmol/L   Potassium 5.4 (H) 3.5 - 5.1 mmol/L   Chloride 97 (L) 98 - 111 mmol/L   BUN 44 (H) 6 - 20 mg/dL   Creatinine, Ser 14.00 (H) 0.61 - 1.24 mg/dL   Glucose, Bld 113 (H) 70 - 99 mg/dL   Calcium, Ion 0.98 (L) 1.15 - 1.40 mmol/L   TCO2 26 22 - 32 mmol/L   Hemoglobin 15.0 13.0 - 17.0 g/dL   HCT 44.0 39.0 - 52.0 %  I-stat troponin, ED     Status: None   Collection Time: 01/13/18  3:05 AM  Result Value Ref Range   Troponin i, poc 0.04 0.00 - 0.08 ng/mL   Comment 3            Comment: Due to the release kinetics of cTnI, a negative result within the first hours of the onset of symptoms does not rule out myocardial infarction with certainty. If myocardial infarction is still suspected, repeat the test at appropriate intervals.   CBC     Status: Abnormal   Collection Time: 01/13/18  5:26 AM  Result Value Ref Range   WBC 7.8 4.0 - 10.5 K/uL   RBC 3.87 (L) 4.22 - 5.81 MIL/uL   Hemoglobin 12.5 (L) 13.0 - 17.0 g/dL   HCT 39.3 39.0 - 52.0 %   MCV 101.6 (H) 78.0 - 100.0  fL   MCH 32.3 26.0 - 34.0 pg   MCHC 31.8 30.0 - 36.0 g/dL   RDW 15.5 11.5 - 15.5 %   Platelets 208 150 - 400 K/uL    Comment: Performed at [facility], [place] [address], [place], [place] [address]  Protime-INR     Status: Abnormal   Collection Time: 01/13/18  5:26 AM  Result Value Ref Range   Prothrombin Time 23.0 (H) 11.4 - 15.2 seconds   INR 2.05     Comment: Performed at [facility] [address] [place], [place] [address]  Troponin I (q 6hr x 3)     Status: Abnormal   Collection Time: 01/13/18  5:26 AM  Result Value Ref Range   Troponin I 0.27 (HH) <0.03 ng/mL    Comment: CRITICAL RESULT CALLED TO, READ BACK BY AND VERIFIED WITH: B.OLDLAND,RN 01/13/18 [id number] DAVISB Performed at [facility], [place] [address] [place], [place] [address]   Prepare RBC     Status: None  Collection Time: 01/13/18  8:21 AM  Result Value Ref Range   Order Confirmation      ORDER PROCESSED BY BLOOD BANK Performed at [facility], [place] [address], [place], [place] [address]    No results found for this or any previous visit (from the past 240 hour(s)). Creatinine: Recent Labs    01/12/18 2159 01/13/18 0247  CREATININE 12.64* 14.00*    Xrays: See report/chart reviewed  Impression/Assessment:  Spontaneous left renal bleed ? Source and abnormal INR; risk factors for underlying malignancy and will need re-imaging months later  Plan:  Agree with IR will see later today Successful renal artery embolization Looks good with little pain Will follow long-term  Angelli Baruch A 01/13/2018, 9:00 AM

## 2018-01-13 NOTE — ED Notes (Signed)
Dr. Gardner at bedside 

## 2018-01-13 NOTE — ED Notes (Signed)
CT contacted for need of emergent scan

## 2018-01-13 NOTE — Progress Notes (Addendum)
Hemorrhage has very obviously progressed on CT.  Ordering [facility] now.  Spoke with IR who recommended fixing INR ASAP.

## 2018-01-13 NOTE — Consult Note (Addendum)
[facility] Renal Consultation Note    Indication for Consultation:  Management of ESRD/hemodialysis; anemia, hypertension/volume and secondary hyperparathyroidism PCP: Iona Beard   HPI: [name] is a 57 y.o. 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 HD MWF at [facility]. On HD since 05/2010.    PMH significant for DM, renal cell carcinoma s/p right nephrectomy 2002, CHF (EF 30-35%), nonobstructive CAD s/p cath 10.2017, atrial flutter on Coumadin since 2017, hx CVA, diverticulitis, right eye blindness.   He is admitted currently with hemorrhage of left renal hematoma. Presented to ED this am with gross hematuria and flank pain. Abd CT showed extensive left perinephric hemorrhage with distortion of the renal parenchyma due to hemorrhage. Got Kcentra, FFP in ED to reverse INR. Underwent successful coil embolization of the main left renal artery per IR.    Labs significant for K 5.4, Troponin 0.27, INR 1.43, Hgb 15>9.7. 2 units PRBCs given.  Hypotensive and tachycardic after procedure and admitted to ICU.   Seen in room with family at bedside. BP 82/63 HR 106 on monitor. Alert, talking, mild discomfort in bed.   Last dialysis Wed. 8/21. He completed full treatment and left 0.7kg below his dry weight. BPs usually 90s-100s/70s on HD. Takes midodrine prior to dialysis.    Past Medical History:  Diagnosis Date  . Atrial flutter ([place])   . 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 Cardiomyopathy   . CHF (congestive heart failure) ([place])   . Coronary artery disease    normal coronaries by 10/10/08 cath, Cardiac Cath 08-04-12 epic.Dr. Doylene Canard follows  . Diabetes mellitus    pt. states he's borderline diabetic., no longer taking med,- off med. since 2013  . Dialysis patient [facility])    Mon-Wed-Fri([place])- Left AV fistula  . Diverticulitis November 2016   reoccurred in December 2016  . ESRD (end stage renal disease) ([place])   .  GERD (gastroesophageal reflux disease)   . Gout   . History of nephrectomy 07/04/2012   History of right nephrectomy in 2002 for renal cell carcinoma   . History of unilateral nephrectomy   . Hypertension   . Low iron   . Myocardial infarction [facility]) ?2006  . Renal cell carcinoma    dialysis- MWF- Dr. Mercy Moore follows.  . Renal insufficiency   . Shortness of breath 05/19/11   "at rest, lying down, w/exertion"  . Stroke [facility]) 02/2011   05/19/11 denies residual  . Umbilical hernia 09/32/67   unrepaired   Past Surgical History:  Procedure Laterality Date  . AV FISTULA PLACEMENT  03/29/2011   Procedure: ARTERIOVENOUS (AV) FISTULA CREATION;  Surgeon: Hinda Lenis, MD;  Location: [place];  Service: Vascular;  Laterality: Left;  LEFT RADIOCEPHALIC , Arteriovenous [id number])  . CARDIAC CATHETERIZATION  05/21/11  . CARDIAC CATHETERIZATION N/A 03/16/2016   Procedure: Left Heart Cath and Coronary Angiography;  Surgeon: Dixie Dials, MD;  Location: [place] CV LAB;  Service: Cardiovascular;  Laterality: N/A;  . CHOLECYSTECTOMY N/A 02/12/2016   Procedure: LAPAROSCOPIC CHOLECYSTECTOMY WITH INTRAOPERATIVE CHOLANGIOGRAM;  Surgeon: Jackolyn Confer, MD;  Location: [place];  Service: General;  Laterality: N/A;  . COLONOSCOPY WITH PROPOFOL N/A 02/01/2017   Procedure: COLONOSCOPY WITH PROPOFOL;  Surgeon: Juanita Craver, MD;  Location: [facility];  Service: Endoscopy;  Laterality: N/A;  . dialysis cath placed    . ESOPHAGOGASTRODUODENOSCOPY (EGD) WITH PROPOFOL N/A 12/02/2015   Procedure: ESOPHAGOGASTRODUODENOSCOPY (EGD) WITH PROPOFOL;  Surgeon: Mar Daring  Collene Mares, MD;  Location: [facility];  Service: Endoscopy;  Laterality: N/A;  . FINGER SURGERY     L pinkie finger- ORIF- /w remaining hardware - 1990's    . HERNIA REPAIR N/A 9/81/1914   Umbilical hernia repair  . INSERTION OF DIALYSIS CATHETER N/A 01/27/2016   Procedure: INSERTION OF DIALYSIS CATHETER;  Surgeon: Waynetta Sandy, MD;  Location:  [place];  Service: Vascular;  Laterality: N/A;  . INSERTION OF MESH N/A 07/04/2012   Procedure: INSERTION OF MESH;  Surgeon: Madilyn Hook, DO;  Location: [place];  Service: General;  Laterality: N/A;  . IR EMBO ART  VEN HEMORR LYMPH EXTRAV  INC GUIDE ROADMAPPING  01/13/2018  . IR FLUORO GUIDE CV LINE RIGHT  01/13/2018  . IR RENAL SELECTIVE  UNI INC S&I MOD SED  01/13/2018  . IR US GUIDE VASC ACCESS RIGHT  01/13/2018  . LAPAROSCOPIC LYSIS OF ADHESIONS N/A 02/12/2016   Procedure: LAPAROSCOPIC LYSIS OF ADHESIONS;  Surgeon: Jackolyn Confer, MD;  Location: [place];  Service: General;  Laterality: N/A;  . LEFT AND RIGHT HEART CATHETERIZATION WITH CORONARY ANGIOGRAM N/A 08/04/2012   Procedure: LEFT AND RIGHT HEART CATHETERIZATION WITH CORONARY ANGIOGRAM;  Surgeon: Birdie Riddle, MD;  Location: [place] CATH LAB;  Service: Cardiovascular;  Laterality: N/A;  . NEPHRECTOMY  2000   right  . REVISON OF ARTERIOVENOUS FISTULA Left 06/05/2013   Procedure: REVISON OF LEFT RADIOCEPHALIC  ARTERIOVENOUS FISTULA;  Surgeon: Conrad [place], MD;  Location: [place];  Service: Vascular;  Laterality: Left;  . REVISON OF ARTERIOVENOUS FISTULA Left 01/27/2016   Procedure: REVISION OF LEFT UPPER EXTREMITY ARTERIOVENOUS FISTULA;  Surgeon: Waynetta Sandy, MD;  Location: [place];  Service: Vascular;  Laterality: Left;  . REVISON OF ARTERIOVENOUS FISTULA Left 08/03/2016   Procedure: REVISON OF Left arm ARTERIOVENOUS FISTULA;  Surgeon: Elam Dutch, MD;  Location: [place];  Service: Vascular;  Laterality: Left;  . REVISON OF ARTERIOVENOUS FISTULA Left 05/06/2017   Procedure: REVISION PLICATION OF ARTERIOVENOUS FISTULA LEFT ARM;  Surgeon: Rosetta Posner, MD;  Location: [place];  Service: Vascular;  Laterality: Left;  . RIGHT HEART CATHETERIZATION N/A 05/21/2011   Procedure: RIGHT HEART CATH;  Surgeon: Birdie Riddle, MD;  Location: [facility] CATH LAB;  Service: Cardiovascular;  Laterality: N/A;  . smashed  1990's   "left pinky; have a plate in there"   . UMBILICAL HERNIA REPAIR N/A 07/04/2012   Procedure: LAPAROSCOPIC UMBILICAL HERNIA;  Surgeon: Madilyn Hook, DO;  Location: [place];  Service: General;  Laterality: N/A;  . US ECHOCARDIOGRAPHY  05/20/11   Family History  Problem Relation Age of Onset  . Hypertension Mother   . Kidney disease Mother    Social History:  reports that he has never smoked. He has never used smokeless tobacco. He reports that he does not drink alcohol or use drugs. Allergies  Allergen Reactions  . Ace Inhibitors Itching and Cough   Prior to Admission medications   Medication Sig Start Date End Date Taking? Authorizing Provider  allopurinol (ZYLOPRIM) 300 MG tablet Take 150 mg by mouth every other day.   Yes [provider]  amiodarone (PACERONE) 200 MG tablet Take 0.5 tablets (100 mg total) by mouth daily. 07/21/17  Yes Regalado, Belkys A, MD  ferric citrate (AURYXIA) 1 GM 210 MG(Fe) tablet Take 210 mg by mouth 3 (three) times daily with meals.    Yes [provider]  lidocaine-prilocaine (EMLA) cream Apply 1 application topically See admin instructions.  Apply topically to port access prior to dialysis - Monday, Wednesday, Friday 04/08/15  Yes [provider]  midodrine (PROAMATINE) 10 MG tablet Take 0.5 tablets (5 mg total) by mouth every Monday, Wednesday, and Friday with hemodialysis. Take on the morning of dialysis days Patient taking differently: Take 5-10 mg by mouth See admin instructions. Take one tablet (10 mg) by mouth before dialysis (Monday, Wednesday, Friday), take 1/2 tablet (5 mg) on non-dialysis days (Sunday, Tuesday, Thursday, Saturday) as needed for diastolic blood pressure <28. 03/18/17  Yes Rai, Ripudeep K, MD  multivitamin (RENA-VIT) TABS tablet Take 1 tablet by mouth at bedtime.   Yes [provider]  nitroGLYCERIN (NITROSTAT) 0.4 MG SL tablet Place 0.4 mg under the tongue every 5 (five) minutes as needed for chest pain.  05/20/15  Yes [provider]   pantoprazole (PROTONIX) 40 MG tablet Take 40 mg by mouth 2 (two) times daily.   Yes [provider]  warfarin (COUMADIN) 10 MG tablet Take 15 mg by mouth daily at 6 PM.   Yes [provider]   Current Facility-Administered Medications  Medication Dose Route Frequency Provider Last Rate Last Dose  . 0.9 %  sodium chloride infusion (Manually program via Guardrails IV Fluids)   Intravenous Once Alcario Drought, Jared M, DO      . 0.9 %  sodium chloride infusion (Manually program via Guardrails IV Fluids)   Intravenous Once Florencia Reasons, MD      . acetaminophen (TYLENOL) tablet 650 mg  650 mg Oral Q6H PRN Etta Quill, DO       Or  . acetaminophen (TYLENOL) suppository 650 mg  650 mg Rectal Q6H PRN Etta Quill, DO      . [START ON 01/14/2018] allopurinol (ZYLOPRIM) tablet 150 mg  150 mg Oral Liz Beach M, DO      . amiodarone (PACERONE) tablet 100 mg  100 mg Oral Daily Jennette Kettle M, DO      . fentaNYL (SUBLIMAZE) injection 25-50 mcg  25-50 mcg Intravenous Q1H PRN Etta Quill, DO      . ferric citrate (AURYXIA) tablet 210 mg  210 mg Oral TID WC Etta Quill, DO   Stopped at 01/13/18 [phone]  . gelatin adsorbable (GELFOAM/SURGIFOAM) 12-7 MM sponge 12-7 mm           . lidocaine (XYLOCAINE) 1 % (with pres) injection           . midazolam (VERSED) 2 MG/2ML injection           . midodrine (PROAMATINE) tablet 10 mg  10 mg Oral Q M,W,F Alcario Drought, Jared M, DO      . midodrine (PROAMATINE) tablet 5 mg  5 mg Oral PRN Etta Quill, DO      . multivitamin (RENA-VIT) tablet 1 tablet  1 tablet Oral QHS Jennette Kettle M, DO      . norepinephrine (LEVOPHED) 4mg  in D5W 262mL premix infusion  0-40 mcg/min Intravenous Titrated Florencia Reasons, MD      . ondansetron [facility]) tablet 4 mg  4 mg Oral Q6H PRN Etta Quill, DO       Or  . ondansetron [facility]) injection 4 mg  4 mg Intravenous Q6H PRN Etta Quill, DO      . pantoprazole (PROTONIX) EC tablet 40 mg  40 mg Oral BID  Jennette Kettle M, DO        ROS: As per HPI otherwise negative.  Physical Exam: Vitals:  01/13/18 1030 01/13/18 1100 01/13/18 1120 01/13/18 1206  BP: (!) 80/59 116/73 (!) 77/45   Pulse: (!) 105     Resp: (!) 31 16 (!) 27   Temp:   (!) 97.5 F (36.4 C) 97.8 F (36.6 C)  TempSrc:   Oral Oral  SpO2:   100%   Weight:   113.8 kg   Height:   6\' 1"  (1.854 m)      General: WDWN male lying bed NAD  Head: NCAT sclera not icteric MMM Neck: Supple. No JVD No masses Lungs: CTA bilaterally without wheezes, rales, or rhonchi. Breathing is unlabored. Heart: RRR with S1 S2 Abdomen: soft NT + BS Lower extremities:without edema or ischemic changes, no open wounds  Neuro: A & O  X 3. Moves all extremities spontaneously. Psych:  Responds to questions appropriately with a normal affect. Dialysis Access: LUE AVF +bruit   Labs: Basic Metabolic Panel: Recent Labs  Lab 01/12/18 2159 01/13/18 0247 01/13/18 1047  NA 136 131* 134*  K 4.7 5.4* 5.9*  CL 94* 97* 93*  CO2 28  --  21*  GLUCOSE 104* 113* 228*  BUN 39* 44* 49*  CREATININE 12.64* 14.00* 13.96*  CALCIUM 8.9  --  8.3*  PHOS  --   --  7.3*   Liver Function Tests: Recent Labs  Lab 01/12/18 2159 01/13/18 1047  AST 10*  --   ALT 8  --   ALKPHOS 68  --   BILITOT 0.9  --   PROT 7.8  --   ALBUMIN 3.1* 2.6*   Recent Labs  Lab 01/12/18 2159  LIPASE 38   No results for input(s): AMMONIA in the last 168 hours. CBC: Recent Labs  Lab 01/12/18 2159  01/13/18 0526 01/13/18 0830 01/13/18 1154  WBC 7.0  --  7.8 11.0* 22.0*  HGB 13.6   < > 12.5* 9.7* 11.6*  HCT 41.8   < > 39.3 30.7* 36.0*  MCV 101.2*  --  101.6* 104.1* 98.4  PLT 199  --  208 244 229   < > = values in this interval not displayed.   Cardiac Enzymes: Recent Labs  Lab 01/13/18 0526 01/13/18 1156  TROPONINI 0.27* 0.21*   CBG: Recent Labs  Lab 01/13/18 0127 01/13/18 1204  GLUCAP 94 190*   Iron Studies: No results for input(s): IRON, TIBC, TRANSFERRIN,  FERRITIN in the last 72 hours. Studies/Results: Ct Abdomen Pelvis W Contrast  Result Date: 01/13/2018 CLINICAL DATA:  Abdominal pain, diverticulitis suspected. Dialysis patient with episode of hematuria today, left abdominal pain. EXAM: CT ABDOMEN AND PELVIS WITH CONTRAST TECHNIQUE: Multidetector CT imaging of the abdomen and pelvis was performed using the standard protocol following bolus administration of intravenous contrast. CONTRAST:  163mL ISOVUE-300 IOPAMIDOL (ISOVUE-300) INJECTION 61% COMPARISON:  CT 02/22/2017 FINDINGS: Lower chest: Multi chamber cardiomegaly. Ground-glass and consolidative opacity in the dependent left lower lobe. No pleural fluid. Hepatobiliary: Small cyst in the left hepatic lobe, unchanged. No new hepatic lesion. Postcholecystectomy with small cystic duct remnant. No biliary dilatation. Pancreas: No ductal dilatation or inflammation. Spleen: Normal in size without focal abnormality. Splenule inferiorly. Adrenals/Urinary Tract: Normal adrenal glands. Post right nephrectomy without suspicious soft tissue density in the nephrectomy bed. There is an ovoid heterogeneous region in the upper left kidney measuring 3.5 x 4.5 cm that is new from prior exam, containing a punctate internal hyperdensity, axial image 26 series 3. Adjacent perinephric edema. Mild left hydroureteronephrosis containing high-density material. Otherwise left renal parenchymal atrophy and cystic  changes of chronic renal disease. Urinary bladder completely decompressed. Stomach/Bowel: Colonic diverticulosis without diverticulitis. Small colonic stool burden. Normal appendix. No small bowel dilatation, obstruction or inflammation. Stomach is nondistended. Vascular/Lymphatic: Aortic and branch atherosclerosis. No aneurysm. Multiple small retroperitoneal and upper abdominal lymph nodes not enlarged by size criteria. No bulky adenopathy. Reproductive: Prostate is unremarkable. Other: Fat containing umbilical hernia.  No  ascites.  No free air. Musculoskeletal: Degenerative disc disease L4-L5. There are no acute or suspicious osseous abnormalities. IMPRESSION: 1. New 3.5 x 4.5 cm ovoid heterogeneous hyperdensity in the upper left kidney with surrounding stranding and small central focus of high-density material, concerning for acute bleed. Findings may be hematoma due to elevated INR versus underlying lesion such as renal cell carcinoma with hemorrhage. Additionally there is mild left hydroureteronephrosis with high-density material throughout the renal collecting system and ureter consistent with blood. 2. Colonic diverticulosis without diverticulitis. 3. Patchy opacity in the left lung base likely reactive atelectasis. 4.  Aortic Atherosclerosis (ICD10-I70.0). These results were called by telephone at the time of interpretation on 01/13/2018 at 12:44 am to Dr. Addison Lank , who verbally acknowledged these results. Electronically Signed   By: Jeb Levering M.D.   On: 01/13/2018 00:44   Ir Angiogram Renal Left Selective  Result Date: 01/13/2018 INDICATION: Hypovolemic shock.  Left perinephric hematoma. EXAM: IR RIGHT FLOURO GUIDE CV LINE; IR EMBO ART VEN HEMORR LYMPH EXTRAV INC GUIDE ROADMAPPING; IR RENAL SELECTIVE UNI INC S+I MODERATE SEDATION; IR ULTRASOUND GUIDANCE VASC ACCESS RIGHT MEDICATIONS: None. The antibiotic was administered within 1 hour of the procedure ANESTHESIA/SEDATION: Moderate (conscious) sedation was employed during this procedure. A total of Versed 0.5 mg and Fentanyl 0 mcg was administered intravenously. Moderate Sedation Time: 57 minutes. The patient's level of consciousness and vital signs were monitored continuously by radiology nursing throughout the procedure under my direct supervision. CONTRAST:  50mL OMNIPAQUE IOHEXOL 300 MG/ML  SOLN FLUOROSCOPY TIME:  Fluoroscopy Time: 12 minutes 30 seconds (620 mGy). COMPLICATIONS: None immediate. PROCEDURE: Informed consent was obtained from the patient  following explanation of the procedure, risks, benefits and alternatives. The patient understands, agrees and consents for the procedure. All questions were addressed. A time out was performed prior to the initiation of the procedure. Maximal barrier sterile technique utilized including caps, mask, sterile gowns, sterile gloves, large sterile drape, hand hygiene, and Betadine prep. The right groin was prepped and draped in a sterile fashion. 1% lidocaine was utilized for local anesthesia. Under sonographic guidance, a micropuncture needle was inserted into the right common femoral artery. Sonographic documentation was obtained and the common femoral artery was noted to be patent. A 1 8 wire was up sized to a Bentson. Five French sheath was inserted. A 5 French pigtail catheter was advanced into the abdominal aorta and AP arteriography was performed. The pigtail was exchanged for a five [place] sauce Omni catheter was advanced over the wire into the thoracic aorta. The reverse curve was formed. The catheter was retracted. It was advanced over a Bentson wire into the left renal artery. Angiography was performed. A Renegade microcatheter was advanced through the sauce Omni catheter and over a 016 fathom wire into the peripheral branches of the left renal artery towards the upper pole. Contrast was injected confirming position in in the peripheral left renal arterial tree. Coil embolization was performed. Coiled utilized were 6 mm x 10 cm interlock, 6 mm x 20 cm interlock, 6 mm x 20 cm interlock. Repeat angiography through the microcatheter was performed.  This demonstrated some residual flow through the coil pack. Gel-Foam slurry was then injected through the microcatheter until stasis of flow was achieved. Microcatheter was removed. The soft was retracted into the right common iliac artery and right femoral angiography was performed. The catheter was removed. The sheath was flushed and sewn in place. Under sonographic  guidance, a micropuncture needle was inserted into the left common femoral vein and removed over a 018 wire which was up sized to a 3 J. Sonographic documentation was obtained. The right common femoral vein was noted to be patent. A 7 French triple-lumen catheter was advanced over the 035 3 J wire. It was flushed and sewn in place. FINDINGS: Aortic arteriography demonstrates left renal artery anatomy. There is significant narrowing just beyond its takeoff. The right renal artery is not clearly visualized and most likely has been ligated. Selective arteriography of the left renal artery delineates anatomy and demonstrates active extravasation involving the upper pole of the left kidney in multiple locations. Subsequent imaging demonstrates selective injection of third order branches of the left renal artery. Serial imaging demonstrates deposition of coils within the left upper pole renal artery branches extending into the main left renal artery. Final arteriography demonstrates occlusion of the left renal artery. Femoral arteriography delineates right femoral artery anatomy and sheath insertion. Final imaging demonstrates a right common femoral vein triple-lumen central venous catheter with its tip in the right common iliac vein. IMPRESSION: Successful coil embolization of the main left renal artery with stasis of flow and resolution of active extravasation of bleeding from the upper pole of the left kidney. Successful right common femoral vein central venous triple-lumen catheter. Electronically Signed   By: Marybelle Killings M.D.   On: 01/13/2018 13:50   Ir Fluoro Guide Cv Line Right  Result Date: 01/13/2018 INDICATION: Hypovolemic shock.  Left perinephric hematoma. EXAM: IR RIGHT FLOURO GUIDE CV LINE; IR EMBO ART VEN HEMORR LYMPH EXTRAV INC GUIDE ROADMAPPING; IR RENAL SELECTIVE UNI INC S+I MODERATE SEDATION; IR ULTRASOUND GUIDANCE VASC ACCESS RIGHT MEDICATIONS: None. The antibiotic was administered within 1 hour of  the procedure ANESTHESIA/SEDATION: Moderate (conscious) sedation was employed during this procedure. A total of Versed 0.5 mg and Fentanyl 0 mcg was administered intravenously. Moderate Sedation Time: 57 minutes. The patient's level of consciousness and vital signs were monitored continuously by radiology nursing throughout the procedure under my direct supervision. CONTRAST:  93mL OMNIPAQUE IOHEXOL 300 MG/ML  SOLN FLUOROSCOPY TIME:  Fluoroscopy Time: 12 minutes 30 seconds (620 mGy). COMPLICATIONS: None immediate. PROCEDURE: Informed consent was obtained from the patient following explanation of the procedure, risks, benefits and alternatives. The patient understands, agrees and consents for the procedure. All questions were addressed. A time out was performed prior to the initiation of the procedure. Maximal barrier sterile technique utilized including caps, mask, sterile gowns, sterile gloves, large sterile drape, hand hygiene, and Betadine prep. The right groin was prepped and draped in a sterile fashion. 1% lidocaine was utilized for local anesthesia. Under sonographic guidance, a micropuncture needle was inserted into the right common femoral artery. Sonographic documentation was obtained and the common femoral artery was noted to be patent. A 1 8 wire was up sized to a Bentson. Five French sheath was inserted. A 5 French pigtail catheter was advanced into the abdominal aorta and AP arteriography was performed. The pigtail was exchanged for a five [place] sauce Omni catheter was advanced over the wire into the thoracic aorta. The reverse curve was formed. The catheter was retracted.  It was advanced over a Bentson wire into the left renal artery. Angiography was performed. A Renegade microcatheter was advanced through the sauce Omni catheter and over a 016 fathom wire into the peripheral branches of the left renal artery towards the upper pole. Contrast was injected confirming position in in the peripheral left  renal arterial tree. Coil embolization was performed. Coiled utilized were 6 mm x 10 cm interlock, 6 mm x 20 cm interlock, 6 mm x 20 cm interlock. Repeat angiography through the microcatheter was performed. This demonstrated some residual flow through the coil pack. Gel-Foam slurry was then injected through the microcatheter until stasis of flow was achieved. Microcatheter was removed. The soft was retracted into the right common iliac artery and right femoral angiography was performed. The catheter was removed. The sheath was flushed and sewn in place. Under sonographic guidance, a micropuncture needle was inserted into the left common femoral vein and removed over a 018 wire which was up sized to a 3 J. Sonographic documentation was obtained. The right common femoral vein was noted to be patent. A 7 French triple-lumen catheter was advanced over the 035 3 J wire. It was flushed and sewn in place. FINDINGS: Aortic arteriography demonstrates left renal artery anatomy. There is significant narrowing just beyond its takeoff. The right renal artery is not clearly visualized and most likely has been ligated. Selective arteriography of the left renal artery delineates anatomy and demonstrates active extravasation involving the upper pole of the left kidney in multiple locations. Subsequent imaging demonstrates selective injection of third order branches of the left renal artery. Serial imaging demonstrates deposition of coils within the left upper pole renal artery branches extending into the main left renal artery. Final arteriography demonstrates occlusion of the left renal artery. Femoral arteriography delineates right femoral artery anatomy and sheath insertion. Final imaging demonstrates a right common femoral vein triple-lumen central venous catheter with its tip in the right common iliac vein. IMPRESSION: Successful coil embolization of the main left renal artery with stasis of flow and resolution of active  extravasation of bleeding from the upper pole of the left kidney. Successful right common femoral vein central venous triple-lumen catheter. Electronically Signed   By: Marybelle Killings M.D.   On: 01/13/2018 13:50   Ir US Guide Vasc Access Right  Result Date: 01/13/2018 INDICATION: Hypovolemic shock.  Left perinephric hematoma. EXAM: IR RIGHT FLOURO GUIDE CV LINE; IR EMBO ART VEN HEMORR LYMPH EXTRAV INC GUIDE ROADMAPPING; IR RENAL SELECTIVE UNI INC S+I MODERATE SEDATION; IR ULTRASOUND GUIDANCE VASC ACCESS RIGHT MEDICATIONS: None. The antibiotic was administered within 1 hour of the procedure ANESTHESIA/SEDATION: Moderate (conscious) sedation was employed during this procedure. A total of Versed 0.5 mg and Fentanyl 0 mcg was administered intravenously. Moderate Sedation Time: 57 minutes. The patient's level of consciousness and vital signs were monitored continuously by radiology nursing throughout the procedure under my direct supervision. CONTRAST:  108mL OMNIPAQUE IOHEXOL 300 MG/ML  SOLN FLUOROSCOPY TIME:  Fluoroscopy Time: 12 minutes 30 seconds (620 mGy). COMPLICATIONS: None immediate. PROCEDURE: Informed consent was obtained from the patient following explanation of the procedure, risks, benefits and alternatives. The patient understands, agrees and consents for the procedure. All questions were addressed. A time out was performed prior to the initiation of the procedure. Maximal barrier sterile technique utilized including caps, mask, sterile gowns, sterile gloves, large sterile drape, hand hygiene, and Betadine prep. The right groin was prepped and draped in a sterile fashion. 1% lidocaine was utilized for local  anesthesia. Under sonographic guidance, a micropuncture needle was inserted into the right common femoral artery. Sonographic documentation was obtained and the common femoral artery was noted to be patent. A 1 8 wire was up sized to a Bentson. Five French sheath was inserted. A 5 French pigtail  catheter was advanced into the abdominal aorta and AP arteriography was performed. The pigtail was exchanged for a five [place] sauce Omni catheter was advanced over the wire into the thoracic aorta. The reverse curve was formed. The catheter was retracted. It was advanced over a Bentson wire into the left renal artery. Angiography was performed. A Renegade microcatheter was advanced through the sauce Omni catheter and over a 016 fathom wire into the peripheral branches of the left renal artery towards the upper pole. Contrast was injected confirming position in in the peripheral left renal arterial tree. Coil embolization was performed. Coiled utilized were 6 mm x 10 cm interlock, 6 mm x 20 cm interlock, 6 mm x 20 cm interlock. Repeat angiography through the microcatheter was performed. This demonstrated some residual flow through the coil pack. Gel-Foam slurry was then injected through the microcatheter until stasis of flow was achieved. Microcatheter was removed. The soft was retracted into the right common iliac artery and right femoral angiography was performed. The catheter was removed. The sheath was flushed and sewn in place. Under sonographic guidance, a micropuncture needle was inserted into the left common femoral vein and removed over a 018 wire which was up sized to a 3 J. Sonographic documentation was obtained. The right common femoral vein was noted to be patent. A 7 French triple-lumen catheter was advanced over the 035 3 J wire. It was flushed and sewn in place. FINDINGS: Aortic arteriography demonstrates left renal artery anatomy. There is significant narrowing just beyond its takeoff. The right renal artery is not clearly visualized and most likely has been ligated. Selective arteriography of the left renal artery delineates anatomy and demonstrates active extravasation involving the upper pole of the left kidney in multiple locations. Subsequent imaging demonstrates selective injection of third  order branches of the left renal artery. Serial imaging demonstrates deposition of coils within the left upper pole renal artery branches extending into the main left renal artery. Final arteriography demonstrates occlusion of the left renal artery. Femoral arteriography delineates right femoral artery anatomy and sheath insertion. Final imaging demonstrates a right common femoral vein triple-lumen central venous catheter with its tip in the right common iliac vein. IMPRESSION: Successful coil embolization of the main left renal artery with stasis of flow and resolution of active extravasation of bleeding from the upper pole of the left kidney. Successful right common femoral vein central venous triple-lumen catheter. Electronically Signed   By: Marybelle Killings M.D.   On: 01/13/2018 13:50   Ct Renal Stone Study  Result Date: 01/13/2018 CLINICAL DATA:  Left renal hemorrhage with increased pain EXAM: CT ABDOMEN AND PELVIS WITHOUT CONTRAST TECHNIQUE: Multidetector CT imaging of the abdomen and pelvis was performed following the standard protocol without oral or IV contrast. COMPARISON:  January 13, 2018 study obtained earlier in the day FINDINGS: Lower chest: There is bibasilar atelectatic change. There are multiple foci coronary artery calcification. There is a small left pleural effusion. Hepatobiliary: There is an 8 mm probable cyst in the left lobe of the liver. No other focal liver lesions are evident. Gallbladder is absent. There is no biliary duct dilatation. Pancreas: No pancreatic mass or inflammatory focus. The pancreas is displaced anteriorly due  to large amount of hemorrhage within the left perinephric fascia. Spleen: No splenic lesions are identified. Spleen is displaced laterally due to extensive left perirenal hemorrhage. Adrenals/Urinary Tract: Adrenals appear unremarkable. There has been extensive progression of hemorrhage throughout the left kidney and perirenal region. The left kidney is edematous  with extensive hemorrhage throughout the left kidney distorting renal architecture. There is extensive soft tissue stranding and increased attenuation fluid throughout the perinephric fascia causing expansion of the perinephric fascia and displacement of adjacent structures. There is mass effect on the adjacent left psoas muscle medially as well as displacement the pancreas anteriorly in the spleen laterally. Hemorrhage tracks throughout the renal parenchyma diffusely, best appreciated on sagittal and coronal images. There are several cystic areas within the left kidney as well as the site of original hematoma in the upper pole region, less well-defined than earlier in the day. The right kidney is absent with surgical clips in the right pararenal region. No mass lesions seen in the right retroperitoneum. Urinary bladder is midline and collapsed. No calculus is seen in the left kidney or ureter. Stomach/Bowel: There is no appreciable bowel wall or mesenteric thickening. Loops of bowel are displaced medially due to the large pararenal hemorrhage on the left. No bowel obstruction. No free air or portal venous air evident. Vascular/Lymphatic: There is aortoiliac atherosclerosis. No aneurysm evident. No adenopathy appreciable. Reproductive: Prostate and seminal vesicles are normal in size and contour. Other: Appendix appears normal. There is a ventral hernia containing only fat, stable. There is fat in each inguinal ring. Note that there is hemorrhage tracking from the pararenal region along the left retroperitoneum into a small inguinal hernia on the left. No abscess evident in the abdomen pelvis. No ascites. Musculoskeletal: There is degenerative change in the lumbar spine with vacuum phenomenon at L4-5. No blastic or lytic bone lesions. No intramuscular lesions are evident. IMPRESSION: 1. Extensive left perinephric hemorrhage with distortion of the renal parenchyma due to hemorrhage. Hemorrhage extends throughout the  pararenal fascia with extension of the pararenal fascia and displacement of adjacent structures as noted. Fluid tracks from the left pararenal fascia inferiorly along the left pelvis to extend into a left inguinal hernia which earlier in the day contained only fat. 2.  Right kidney absent.  No lesions seen in right pararenal fossa. 3. No bowel obstruction. No abscess in the abdomen or pelvis. Appendix appears unremarkable. 4.  There is aortoiliac atherosclerosis. 5.  Small ventral hernia containing only fat. 6.  Small left pleural effusion. Aortic Atherosclerosis (ICD10-I70.0). Critical Value/emergent results were called by telephone at the time of interpretation on 01/13/2018 at 7:52 am to emergency department physician covering for Dr. Alcario Drought, who verbally acknowledged these results. Electronically Signed   By: Lowella Grip III M.D.   On: 01/13/2018 07:52   Ir Embo Art  [place] Guide Roadmapping  Result Date: 01/13/2018 INDICATION: Hypovolemic shock.  Left perinephric hematoma. EXAM: IR RIGHT FLOURO GUIDE CV LINE; IR EMBO ART VEN HEMORR LYMPH EXTRAV INC GUIDE ROADMAPPING; IR RENAL SELECTIVE UNI INC S+I MODERATE SEDATION; IR ULTRASOUND GUIDANCE VASC ACCESS RIGHT MEDICATIONS: None. The antibiotic was administered within 1 hour of the procedure ANESTHESIA/SEDATION: Moderate (conscious) sedation was employed during this procedure. A total of Versed 0.5 mg and Fentanyl 0 mcg was administered intravenously. Moderate Sedation Time: 57 minutes. The patient's level of consciousness and vital signs were monitored continuously by radiology nursing throughout the procedure under my direct supervision. CONTRAST:  51mL OMNIPAQUE IOHEXOL  300 MG/ML  SOLN FLUOROSCOPY TIME:  Fluoroscopy Time: 12 minutes 30 seconds (620 mGy). COMPLICATIONS: None immediate. PROCEDURE: Informed consent was obtained from the patient following explanation of the procedure, risks, benefits and alternatives. The patient  understands, agrees and consents for the procedure. All questions were addressed. A time out was performed prior to the initiation of the procedure. Maximal barrier sterile technique utilized including caps, mask, sterile gowns, sterile gloves, large sterile drape, hand hygiene, and Betadine prep. The right groin was prepped and draped in a sterile fashion. 1% lidocaine was utilized for local anesthesia. Under sonographic guidance, a micropuncture needle was inserted into the right common femoral artery. Sonographic documentation was obtained and the common femoral artery was noted to be patent. A 1 8 wire was up sized to a Bentson. Five French sheath was inserted. A 5 French pigtail catheter was advanced into the abdominal aorta and AP arteriography was performed. The pigtail was exchanged for a five [place] sauce Omni catheter was advanced over the wire into the thoracic aorta. The reverse curve was formed. The catheter was retracted. It was advanced over a Bentson wire into the left renal artery. Angiography was performed. A Renegade microcatheter was advanced through the sauce Omni catheter and over a 016 fathom wire into the peripheral branches of the left renal artery towards the upper pole. Contrast was injected confirming position in in the peripheral left renal arterial tree. Coil embolization was performed. Coiled utilized were 6 mm x 10 cm interlock, 6 mm x 20 cm interlock, 6 mm x 20 cm interlock. Repeat angiography through the microcatheter was performed. This demonstrated some residual flow through the coil pack. Gel-Foam slurry was then injected through the microcatheter until stasis of flow was achieved. Microcatheter was removed. The soft was retracted into the right common iliac artery and right femoral angiography was performed. The catheter was removed. The sheath was flushed and sewn in place. Under sonographic guidance, a micropuncture needle was inserted into the left common femoral vein and  removed over a 018 wire which was up sized to a 3 J. Sonographic documentation was obtained. The right common femoral vein was noted to be patent. A 7 French triple-lumen catheter was advanced over the 035 3 J wire. It was flushed and sewn in place. FINDINGS: Aortic arteriography demonstrates left renal artery anatomy. There is significant narrowing just beyond its takeoff. The right renal artery is not clearly visualized and most likely has been ligated. Selective arteriography of the left renal artery delineates anatomy and demonstrates active extravasation involving the upper pole of the left kidney in multiple locations. Subsequent imaging demonstrates selective injection of third order branches of the left renal artery. Serial imaging demonstrates deposition of coils within the left upper pole renal artery branches extending into the main left renal artery. Final arteriography demonstrates occlusion of the left renal artery. Femoral arteriography delineates right femoral artery anatomy and sheath insertion. Final imaging demonstrates a right common femoral vein triple-lumen central venous catheter with its tip in the right common iliac vein. IMPRESSION: Successful coil embolization of the main left renal artery with stasis of flow and resolution of active extravasation of bleeding from the upper pole of the left kidney. Successful right common femoral vein central venous triple-lumen catheter. Electronically Signed   By: Marybelle Killings M.D.   On: 01/13/2018 13:50    Dialysis Orders:  [facility] MWF 4h 450/800 EDW 111.5kg 2/2.25 Profile 4 Na Linear L AVF Heparin 7000 bolus, 3000 mid treatment Hectorol  60mcg IV TIW Parsabiv 12.5mg  IV TIW  Assessment/Plan: 1. L renal hemorrhage/shock -s/p IR emobolization - On pressors per PCCM/  2. ESRD -  Usually MWF  HD. Mild hyperkalemia.  No plans for HD today. Allow pt to stabilize in ICU. Plan for gentle HD tomorrow as BP allows. Hold heparin for 1-2 weeks.    3. Hypotension/volume  - Chronic hypotension as outpatient on midodrine for BP support. Baseline BP's in the 90's at OP HD, on midodrine pre HD.   4. Anemia  - Hgb back up to 11.6 s/p 2 units PRBCs today. Continue to monitor  5. Metabolic bone disease -  Ca ok. Phos elevated. Resume binders when eating. Continue Hectorol.  6. Aflutter - on amiodarone. Holding Coumadin  7. Hx RCC s/p right nephrectomy 8. NICM EF 30-35%  9. DM   Lynnda Child PA-C [facility] Pager [phone] 01/13/2018, 2:04 PM   Pt seen, examined, agree w assess/plan as above with additions as indicated.  Kelly Splinter MD [employer] pager [phone]    cell [phone] 01/13/2018, 3:32 PM

## 2018-01-13 NOTE — ED Provider Notes (Addendum)
I received an emergent call from radiology regarding this patient.  Extensive hemorrhage from the kidney, concerning for active bleeding.  Went to evaluated bedside, discussed case with hospitalist.  Patient needs more AC reversal, Kcentra had already been ordered.  Care was discussed with IR.  Patient clinically looking worse, diaphoretic, soft blood pressure, tachycardic.  I assisted in obtaining more IV access as described below.  Was also called to the IR suite to assist in resuscitation.  After stabilization with blood products, care was handed over to the hospitalist/critical care team/IR team.  Emergency Ultrasound Study:   Angiocath insertion Performed by: Maudie Flakes  Consent: Verbal consent obtained. Risks and benefits: risks, benefits and alternatives were discussed Immediately prior to procedure the correct patient, procedure, equipment, support staff and site/side marked as needed.  Indication: difficult IV access Preparation: Patient was prepped and draped in the usual sterile fashion. Vein Location: The right brachial vein was visualized during assessment for potential access sites and was found to be patent/ easily compressed with linear ultrasound.  The needle was visualized with real-time ultrasound and guided into the vein. Gauge: 18  Normal blood return.  Patient tolerance: Patient tolerated the procedure well with no immediate complications.  Critical Care Documentation Critical care time provided by me (excluding procedures): 32 minutes  Condition necessitating critical care: Acute blood loss requiring transfusion, hypovolemic shock  Components of critical care management: reviewing of prior records, laboratory and imaging interpretation, frequent re-examination and reassessment of vital signs, administration of red blood cells, IV fluid resuscitation, discussion with consultants.   Maudie Flakes, MD 01/13/18 0900    Maudie Flakes, MD 01/13/18 [phone]

## 2018-01-13 NOTE — Consult Note (Signed)
Chief Complaint: Patient was seen in consultation today for renal arteriogram with possible embolization Chief Complaint  Patient presents with  . Hematuria   at the request of Dr Dillard Cannon  Supervising Physician: Marybelle Killings  Patient Status: [facility] - ED  History of Present Illness: [name] is a 57 y.o. male   Hx Rt nephrectomy 2002-- [facility] Onset hematuria this am ESRD since 2013-- has not made urine in over 5 yrs-- early am he noted bloody urine output Noted abd pain for few days Hx-- Aflutter- amiodarone and coumadin INR this am 4.59--2.05 after Vit K and FFP Kcentra in ED just now-- will recheck INR  Was stable in ED until now Noted drop in BP; diaphoresis  Re CT 7:30 am:  IMPRESSION: 1. Extensive left perinephric hemorrhage with distortion of the renal parenchyma due to hemorrhage. Hemorrhage extends throughout the pararenal fascia with extension of the pararenal fascia and displacement of adjacent structures as noted. Fluid tracks from the left pararenal fascia inferiorly along the left pelvis to extend into a left inguinal hernia which earlier in the day contained only fat.  Request for arteriogram with possible embolization Dr Barbie Banner has reviewed imaging and chart-- approves procedure Scheduled now for same   Past Medical History:  Diagnosis Date  . Atrial flutter ([place])   . 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 Cardiomyopathy   . CHF (congestive heart failure) ([place])   . Coronary artery disease    normal coronaries by 10/10/08 cath, Cardiac Cath 08-04-12 epic.Dr. Doylene Canard follows  . Diabetes mellitus    pt. states he's borderline diabetic., no longer taking med,- off med. since 2013  . Dialysis patient [facility])    Mon-Wed-Fri([employer])- Left AV fistula  . Diverticulitis November 2016   reoccurred in December 2016  . ESRD (end stage renal disease) ([place])   . GERD (gastroesophageal reflux disease)   . Gout   . History of  nephrectomy 07/04/2012   History of right nephrectomy in 2002 for renal cell carcinoma   . History of unilateral nephrectomy   . Hypertension   . Low iron   . Myocardial infarction [facility]) ?2006  . Renal cell carcinoma    dialysis- MWF- Dr. Mercy Moore follows.  . Renal insufficiency   . Shortness of breath 05/19/11   "at rest, lying down, w/exertion"  . Stroke [facility]) 02/2011   05/19/11 denies residual  . Umbilical hernia 67/12/45   unrepaired    Past Surgical History:  Procedure Laterality Date  . AV FISTULA PLACEMENT  03/29/2011   Procedure: ARTERIOVENOUS (AV) FISTULA CREATION;  Surgeon: Hinda Lenis, MD;  Location: [place];  Service: Vascular;  Laterality: Left;  LEFT RADIOCEPHALIC , Arteriovenous [id number])  . CARDIAC CATHETERIZATION  05/21/11  . CARDIAC CATHETERIZATION N/A 03/16/2016   Procedure: Left Heart Cath and Coronary Angiography;  Surgeon: Dixie Dials, MD;  Location: [place] CV LAB;  Service: Cardiovascular;  Laterality: N/A;  . CHOLECYSTECTOMY N/A 02/12/2016   Procedure: LAPAROSCOPIC CHOLECYSTECTOMY WITH INTRAOPERATIVE CHOLANGIOGRAM;  Surgeon: Jackolyn Confer, MD;  Location: [place];  Service: General;  Laterality: N/A;  . COLONOSCOPY WITH PROPOFOL N/A 02/01/2017   Procedure: COLONOSCOPY WITH PROPOFOL;  Surgeon: Juanita Craver, MD;  Location: [facility] ENDOSCOPY;  Service: Endoscopy;  Laterality: N/A;  . dialysis cath placed    . ESOPHAGOGASTRODUODENOSCOPY (EGD) WITH PROPOFOL N/A 12/02/2015   Procedure: ESOPHAGOGASTRODUODENOSCOPY (EGD) WITH PROPOFOL;  Surgeon: Juanita Craver, MD;  Location: [facility];  Service: Endoscopy;  Laterality: N/A;  . FINGER SURGERY     L pinkie finger- ORIF- /w remaining hardware - 1990's    . HERNIA REPAIR N/A 5/57/3220   Umbilical hernia repair  . INSERTION OF DIALYSIS CATHETER N/A 01/27/2016   Procedure: INSERTION OF DIALYSIS CATHETER;  Surgeon: Waynetta Sandy, MD;  Location: [place];  Service: Vascular;  Laterality: N/A;  . INSERTION OF  MESH N/A 07/04/2012   Procedure: INSERTION OF MESH;  Surgeon: Madilyn Hook, DO;  Location: [place];  Service: General;  Laterality: N/A;  . LAPAROSCOPIC LYSIS OF ADHESIONS N/A 02/12/2016   Procedure: LAPAROSCOPIC LYSIS OF ADHESIONS;  Surgeon: Jackolyn Confer, MD;  Location: [place];  Service: General;  Laterality: N/A;  . LEFT AND RIGHT HEART CATHETERIZATION WITH CORONARY ANGIOGRAM N/A 08/04/2012   Procedure: LEFT AND RIGHT HEART CATHETERIZATION WITH CORONARY ANGIOGRAM;  Surgeon: Birdie Riddle, MD;  Location: [place] CATH LAB;  Service: Cardiovascular;  Laterality: N/A;  . NEPHRECTOMY  2000   right  . REVISON OF ARTERIOVENOUS FISTULA Left 06/05/2013   Procedure: REVISON OF LEFT RADIOCEPHALIC  ARTERIOVENOUS FISTULA;  Surgeon: Conrad , MD;  Location: [place];  Service: Vascular;  Laterality: Left;  . REVISON OF ARTERIOVENOUS FISTULA Left 01/27/2016   Procedure: REVISION OF LEFT UPPER EXTREMITY ARTERIOVENOUS FISTULA;  Surgeon: Waynetta Sandy, MD;  Location: [place];  Service: Vascular;  Laterality: Left;  . REVISON OF ARTERIOVENOUS FISTULA Left 08/03/2016   Procedure: REVISON OF Left arm ARTERIOVENOUS FISTULA;  Surgeon: Elam Dutch, MD;  Location: [place];  Service: Vascular;  Laterality: Left;  . REVISON OF ARTERIOVENOUS FISTULA Left 05/06/2017   Procedure: REVISION PLICATION OF ARTERIOVENOUS FISTULA LEFT ARM;  Surgeon: Rosetta Posner, MD;  Location: [place];  Service: Vascular;  Laterality: Left;  . RIGHT HEART CATHETERIZATION N/A 05/21/2011   Procedure: RIGHT HEART CATH;  Surgeon: Birdie Riddle, MD;  Location: [facility] CATH LAB;  Service: Cardiovascular;  Laterality: N/A;  . smashed  1990's   "left pinky; have a plate in there"  . UMBILICAL HERNIA REPAIR N/A 07/04/2012   Procedure: LAPAROSCOPIC UMBILICAL HERNIA;  Surgeon: Madilyn Hook, DO;  Location: [place];  Service: General;  Laterality: N/A;  . US ECHOCARDIOGRAPHY  05/20/11    Allergies: Ace inhibitors  Medications: Prior to Admission  medications   Medication Sig Start Date End Date Taking? Authorizing Provider  allopurinol (ZYLOPRIM) 300 MG tablet Take 150 mg by mouth every other day.   Yes [provider]  amiodarone (PACERONE) 200 MG tablet Take 0.5 tablets (100 mg total) by mouth daily. 07/21/17  Yes Regalado, Belkys A, MD  ferric citrate (AURYXIA) 1 GM 210 MG(Fe) tablet Take 210 mg by mouth 3 (three) times daily with meals.    Yes [provider]  lidocaine-prilocaine (EMLA) cream Apply 1 application topically See admin instructions. Apply topically to port access prior to dialysis - Monday, Wednesday, Friday 04/08/15  Yes [provider]  midodrine (PROAMATINE) 10 MG tablet Take 0.5 tablets (5 mg total) by mouth every Monday, Wednesday, and Friday with hemodialysis. Take on the morning of dialysis days Patient taking differently: Take 5-10 mg by mouth See admin instructions. Take one tablet (10 mg) by mouth before dialysis (Monday, Wednesday, Friday), take 1/2 tablet (5 mg) on non-dialysis days (Sunday, Tuesday, Thursday, Saturday) as needed for diastolic blood pressure <25. 03/18/17  Yes Rai, Ripudeep K, MD  multivitamin (RENA-VIT) TABS tablet Take 1 tablet by mouth at bedtime.   Yes  [provider]  nitroGLYCERIN (NITROSTAT) 0.4 MG SL tablet Place 0.4 mg under the tongue every 5 (five) minutes as needed for chest pain.  05/20/15  Yes [provider]  pantoprazole (PROTONIX) 40 MG tablet Take 40 mg by mouth 2 (two) times daily.   Yes [provider]  warfarin (COUMADIN) 10 MG tablet Take 15 mg by mouth daily at 6 PM.   Yes [provider]     Family History  Problem Relation Age of Onset  . Hypertension Mother   . Kidney disease Mother     Social History   Socioeconomic History  . Marital status: Married    Spouse name: Not on file  . Number of children: Not on file  . Years of education: Not on file  . Highest education level: Not on file    Occupational History  . Not on file  Social Needs  . Financial resource strain: Not on file  . Food insecurity:    Worry: Not on file    Inability: Not on file  . Transportation needs:    Medical: Not on file    Non-medical: Not on file  Tobacco Use  . Smoking status: Never Smoker  . Smokeless tobacco: Never Used  Substance and Sexual Activity  . Alcohol use: No    Alcohol/week: 0.0 standard drinks  . Drug use: No    Types: Cocaine    Comment: Past hx-none in 20 yrs  . Sexual activity: Yes  Lifestyle  . Physical activity:    Days per week: Not on file    Minutes per session: Not on file  . Stress: Not on file  Relationships  . Social connections:    Talks on phone: Not on file    Gets together: Not on file    Attends religious service: Not on file    Active member of club or organization: Not on file    Attends meetings of clubs or organizations: Not on file    Relationship status: Not on file  Other Topics Concern  . Not on file  Social History Narrative  . Not on file    Review of Systems: A 12 point ROS discussed and pertinent positives are indicated in the HPI above.  All other systems are negative.  Review of Systems  Constitutional: Positive for activity change and diaphoresis.  Respiratory: Negative for cough and shortness of breath.   Cardiovascular: Negative for chest pain.  Gastrointestinal: Positive for abdominal pain.  Neurological: Positive for weakness.  Psychiatric/Behavioral: Positive for decreased concentration. Negative for behavioral problems and confusion.    Vital Signs: BP 99/88   Pulse (!) 55   Temp 97.6 F (36.4 C)   Resp 14   Ht 6\' 1"  (1.854 m)   Wt 242 lb 15.2 oz (110.2 kg)   SpO2 96%   BMI 32.05 kg/m   Physical Exam  Constitutional: He is oriented to person, place, and time.  Cardiovascular: Normal rate and regular rhythm.  Pulmonary/Chest: Effort normal and breath sounds normal.  Abdominal: Soft. There is tenderness.   Musculoskeletal: Normal range of motion.  Neurological: He is alert and oriented to person, place, and time.  Skin: Skin is warm and dry.  Psychiatric: He has a normal mood and affect. His behavior is normal.  Wife at bedside-- consented for pt  Nursing note and vitals reviewed.   Imaging: Ct Abdomen Pelvis W Contrast  Result Date: 01/13/2018 CLINICAL DATA:  Abdominal pain, diverticulitis suspected. Dialysis  patient with episode of hematuria today, left abdominal pain. EXAM: CT ABDOMEN AND PELVIS WITH CONTRAST TECHNIQUE: Multidetector CT imaging of the abdomen and pelvis was performed using the standard protocol following bolus administration of intravenous contrast. CONTRAST:  143mL ISOVUE-300 IOPAMIDOL (ISOVUE-300) INJECTION 61% COMPARISON:  CT 02/22/2017 FINDINGS: Lower chest: Multi chamber cardiomegaly. Ground-glass and consolidative opacity in the dependent left lower lobe. No pleural fluid. Hepatobiliary: Small cyst in the left hepatic lobe, unchanged. No new hepatic lesion. Postcholecystectomy with small cystic duct remnant. No biliary dilatation. Pancreas: No ductal dilatation or inflammation. Spleen: Normal in size without focal abnormality. Splenule inferiorly. Adrenals/Urinary Tract: Normal adrenal glands. Post right nephrectomy without suspicious soft tissue density in the nephrectomy bed. There is an ovoid heterogeneous region in the upper left kidney measuring 3.5 x 4.5 cm that is new from prior exam, containing a punctate internal hyperdensity, axial image 26 series 3. Adjacent perinephric edema. Mild left hydroureteronephrosis containing high-density material. Otherwise left renal parenchymal atrophy and cystic changes of chronic renal disease. Urinary bladder completely decompressed. Stomach/Bowel: Colonic diverticulosis without diverticulitis. Small colonic stool burden. Normal appendix. No small bowel dilatation, obstruction or inflammation. Stomach is nondistended.  Vascular/Lymphatic: Aortic and branch atherosclerosis. No aneurysm. Multiple small retroperitoneal and upper abdominal lymph nodes not enlarged by size criteria. No bulky adenopathy. Reproductive: Prostate is unremarkable. Other: Fat containing umbilical hernia.  No ascites.  No free air. Musculoskeletal: Degenerative disc disease L4-L5. There are no acute or suspicious osseous abnormalities. IMPRESSION: 1. New 3.5 x 4.5 cm ovoid heterogeneous hyperdensity in the upper left kidney with surrounding stranding and small central focus of high-density material, concerning for acute bleed. Findings may be hematoma due to elevated INR versus underlying lesion such as renal cell carcinoma with hemorrhage. Additionally there is mild left hydroureteronephrosis with high-density material throughout the renal collecting system and ureter consistent with blood. 2. Colonic diverticulosis without diverticulitis. 3. Patchy opacity in the left lung base likely reactive atelectasis. 4.  Aortic Atherosclerosis (ICD10-I70.0). These results were called by telephone at the time of interpretation on 01/13/2018 at 12:44 am to Dr. Addison Lank , who verbally acknowledged these results. Electronically Signed   By: Jeb Levering M.D.   On: 01/13/2018 00:44   Ct Renal Stone Study  Result Date: 01/13/2018 CLINICAL DATA:  Left renal hemorrhage with increased pain EXAM: CT ABDOMEN AND PELVIS WITHOUT CONTRAST TECHNIQUE: Multidetector CT imaging of the abdomen and pelvis was performed following the standard protocol without oral or IV contrast. COMPARISON:  January 13, 2018 study obtained earlier in the day FINDINGS: Lower chest: There is bibasilar atelectatic change. There are multiple foci coronary artery calcification. There is a small left pleural effusion. Hepatobiliary: There is an 8 mm probable cyst in the left lobe of the liver. No other focal liver lesions are evident. Gallbladder is absent. There is no biliary duct dilatation.  Pancreas: No pancreatic mass or inflammatory focus. The pancreas is displaced anteriorly due to large amount of hemorrhage within the left perinephric fascia. Spleen: No splenic lesions are identified. Spleen is displaced laterally due to extensive left perirenal hemorrhage. Adrenals/Urinary Tract: Adrenals appear unremarkable. There has been extensive progression of hemorrhage throughout the left kidney and perirenal region. The left kidney is edematous with extensive hemorrhage throughout the left kidney distorting renal architecture. There is extensive soft tissue stranding and increased attenuation fluid throughout the perinephric fascia causing expansion of the perinephric fascia and displacement of adjacent structures. There is mass effect on the adjacent left psoas muscle medially  as well as displacement the pancreas anteriorly in the spleen laterally. Hemorrhage tracks throughout the renal parenchyma diffusely, best appreciated on sagittal and coronal images. There are several cystic areas within the left kidney as well as the site of original hematoma in the upper pole region, less well-defined than earlier in the day. The right kidney is absent with surgical clips in the right pararenal region. No mass lesions seen in the right retroperitoneum. Urinary bladder is midline and collapsed. No calculus is seen in the left kidney or ureter. Stomach/Bowel: There is no appreciable bowel wall or mesenteric thickening. Loops of bowel are displaced medially due to the large pararenal hemorrhage on the left. No bowel obstruction. No free air or portal venous air evident. Vascular/Lymphatic: There is aortoiliac atherosclerosis. No aneurysm evident. No adenopathy appreciable. Reproductive: Prostate and seminal vesicles are normal in size and contour. Other: Appendix appears normal. There is a ventral hernia containing only fat, stable. There is fat in each inguinal ring. Note that there is hemorrhage tracking from the  pararenal region along the left retroperitoneum into a small inguinal hernia on the left. No abscess evident in the abdomen pelvis. No ascites. Musculoskeletal: There is degenerative change in the lumbar spine with vacuum phenomenon at L4-5. No blastic or lytic bone lesions. No intramuscular lesions are evident. IMPRESSION: 1. Extensive left perinephric hemorrhage with distortion of the renal parenchyma due to hemorrhage. Hemorrhage extends throughout the pararenal fascia with extension of the pararenal fascia and displacement of adjacent structures as noted. Fluid tracks from the left pararenal fascia inferiorly along the left pelvis to extend into a left inguinal hernia which earlier in the day contained only fat. 2.  Right kidney absent.  No lesions seen in right pararenal fossa. 3. No bowel obstruction. No abscess in the abdomen or pelvis. Appendix appears unremarkable. 4.  There is aortoiliac atherosclerosis. 5.  Small ventral hernia containing only fat. 6.  Small left pleural effusion. Aortic Atherosclerosis (ICD10-I70.0). Critical Value/emergent results were called by telephone at the time of interpretation on 01/13/2018 at 7:52 am to emergency department physician covering for Dr. Alcario Drought, who verbally acknowledged these results. Electronically Signed   By: Lowella Grip III M.D.   On: 01/13/2018 07:52    Labs:  CBC: Recent Labs    07/18/17 0531 07/20/17 0214 01/12/18 2159 01/13/18 0247 01/13/18 0526  WBC 4.8 3.8* 7.0  --  7.8  HGB 10.4* 10.0* 13.6 15.0 12.5*  HCT 32.8* 30.3* 41.8 44.0 39.3  PLT 185 180 199  --  208    COAGS: Recent Labs    03/16/17 0900 03/17/17 0318  07/20/17 0214 07/21/17 0430 01/12/18 2159 01/13/18 0526  INR 1.40 1.73   < > 1.89 1.38 4.59* 2.05  APTT 44* 52*  --   --   --   --   --    < > = values in this interval not displayed.    BMP: Recent Labs    07/19/17 0951 07/20/17 0214 07/21/17 0430 01/12/18 2159 01/13/18 0247  NA 135 134*  134* 137  136 131*  K 4.4 4.3  4.3 3.9 4.7 5.4*  CL 97* 96*  96* 93* 94* 97*  CO2 24 24  25 30 28   --   GLUCOSE 70 98  98 110* 104* 113*  BUN 35* 47*  47* 13 39* 44*  CALCIUM 9.4 9.2  9.2 9.1 8.9  --   CREATININE 10.77* 12.73*  12.65* 5.30* 12.64* 14.00*  GFRNONAA 5* 4*  4* 11* 4*  --   GFRAA 5* 4*  4* 13* 5*  --     LIVER FUNCTION TESTS: Recent Labs    02/22/17 0830 03/16/17 0900 03/17/17 0318 07/18/17 0531 07/20/17 0214 01/12/18 2159  BILITOT 0.5 0.5 0.6  --   --  0.9  AST 20 20 18   --   --  10*  ALT 15* <5* <5*  --   --  8  ALKPHOS 114 86 80  --   --  68  PROT 8.4* 8.2* 7.2  --   --  7.8  ALBUMIN 3.4* 3.3* 2.9* 3.2* 3.1* 3.1*    TUMOR MARKERS: No results for input(s): AFPTM, CEA, CA199, CHROMGRNA in the last 8760 hours.  Assessment and Plan:  Right nephrectomy 2002 ESRD since 2013 New onset hematuria this am Imaging revealing left renal hemorrhage Hypotensive; diaphoretic  Now for arteriogram with possible embolization Risks and benefits of renal arteriogram with embolizationwere discussed with the patient including, but not limited to bleeding, infection, vascular injury or contrast induced renal failure.  This interventional procedure involves the use of X-rays and because of the nature of the planned procedure, it is possible that we will have prolonged use of X-ray fluoroscopy.  Potential radiation risks to you include (but are not limited to) the following: - A slightly elevated risk for cancer  several years later in life. This risk is typically less than 0.5% percent. This risk is low in comparison to the normal incidence of human cancer, which is 33% for women and 50% for men according to the [employer]. - Radiation induced injury can include skin redness, resembling a rash, tissue breakdown / ulcers and hair loss (which can be temporary or permanent).   The likelihood of either of these occurring depends on the difficulty of the procedure and  whether you are sensitive to radiation due to previous procedures, disease, or genetic conditions.   IF your procedure requires a prolonged use of radiation, you will be notified and given written instructions for further action.  It is your responsibility to monitor the irradiated area for the 2 weeks following the procedure and to notify your physician if you are concerned that you have suffered a radiation induced injury.    All of the patient's and wife questions were answered, all are agreeable to proceed.  Consent signed and in chart.  Thank you for this interesting consult.  I greatly enjoyed meeting [name] and look forward to participating in their care.  A copy of this report was sent to the requesting provider on this date.  Electronically Signed: Lavonia Drafts, PA-C 01/13/2018, 8:59 AM   I spent a total of 40 Minutes    in face to face in clinical consultation, greater than 50% of which was counseling/coordinating care for renal arteriogram/embolization

## 2018-01-13 NOTE — ED Notes (Signed)
Pt given pain medication for abd pain, and continues to be diaphoretic.

## 2018-01-13 NOTE — Progress Notes (Addendum)
IV TEAM: Arrived to ED, pt already has 2 PIV sites established. Consult canceled, per ED RN.

## 2018-01-13 NOTE — ED Notes (Signed)
Pt having severe pain to L flank, diaphoretic at present, unable to get comfortable

## 2018-01-13 NOTE — ED Notes (Signed)
Pt now c/o increased sob, pt appears very uncomfortable. Dr.Gardner at bedside

## 2018-01-13 NOTE — Procedures (Signed)
L renal embolization EBL 0 Comp 0

## 2018-01-13 NOTE — ED Provider Notes (Signed)
[facility] EMERGENCY DEPARTMENT Provider Note  CSN: [id number] Arrival date & time: 01/12/18 2140  Chief Complaint(s) Hematuria  HPI [name] is a 57 y.o. male with extensive past medical history listed below including hypertension, diabetes, ESRD (on dialysis), atrial flutter on Coumadin, congestive heart failure who presents to the emergency department with hematuria.  Patient states that he has not voided in 5 years.  He has been having 3 to 4 days of left lower quadrant and left flank pain that has been gradually worsening since onset.  Reports that it feels similar to his prior diverticulitis.  Pain is exacerbated with movement and palpation of this area.  He reports that the pain became severe approximately 2 to 3 hours ago with the episode of hematuria.  He reports only one episode of hematuria.  Denies any continued bleeding.  Denies any recent fevers or infections.  Endorsing nausea without emesis.  He is also endorsing diarrhea.  Denies any other physical complaints.  HPI  Past Medical History Past Medical History:  Diagnosis Date  . Atrial flutter ([place])   . 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 Cardiomyopathy   . CHF (congestive heart failure) ([place])   . Coronary artery disease    normal coronaries by 10/10/08 cath, Cardiac Cath 08-04-12 epic.Dr. Doylene Canard follows  . Diabetes mellitus    pt. states he's borderline diabetic., no longer taking med,- off med. since 2013  . Dialysis patient [facility])    Mon-Wed-Fri([place])- Left AV fistula  . Diverticulitis November 2016   reoccurred in December 2016  . ESRD (end stage renal disease) ([place])   . GERD (gastroesophageal reflux disease)   . Gout   . History of nephrectomy 07/04/2012   History of right nephrectomy in 2002 for renal cell carcinoma   . History of unilateral nephrectomy   . Hypertension   . Low iron   . Myocardial infarction [facility]) ?2006  . Renal cell carcinoma    dialysis- MWF- Dr. Mercy Moore follows.  . Renal insufficiency   . Shortness of breath 05/19/11   "at rest, lying down, w/exertion"  . Stroke [facility]) 02/2011   05/19/11 denies residual  . Umbilical hernia 33/54/56   unrepaired   Patient Active Problem List   Diagnosis Date Noted  . History of atrial flutter 01/13/2018  . Hemorrhage of left kidney 01/13/2018  . Acute respiratory failure with hypoxemia ([place]) 07/17/2017  . 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  . Acute on chronic combined systolic and diastolic CHF, NYHA class 4 ([place]) 07/17/2017  . Unresponsiveness 03/16/2017  . Hypotension 03/16/2017  . Paroxysmal atrial flutter ([place]) 03/16/2017  . Chronic pain 03/16/2017  . Leg pain, bilateral 03/16/2017  . Bile leak, postoperative 03/15/2016  . Elevated troponin 03/15/2016  . Rib pain on right side   . Biliary dyskinesia 02/12/2016  . Atrial flutter with rapid ventricular response ([place]) 06/27/2015  . Diverticulitis 04/21/2015  . Acute diverticulitis 04/21/2015  . Diverticulitis of intestine without perforation or abscess without bleeding 04/21/2015  . Pulmonary edema 01/13/2015  . Right upper quadrant abdominal pain 01/13/2015  . Acute respiratory failure with hypoxia ([place]) 01/13/2015  . Diverticulitis large intestine w/o perforation or abscess w/o bleeding 12/18/2014  . Hepatic cyst 12/18/2014  . Aftercare following surgery of the circulatory system, [place] 06/22/2013  . ESRD (end stage renal disease) on dialysis ([place]) 11/28/2012  . Right sided weakness 10/05/2012  . History of nephrectomy 07/04/2012  .  End-stage renal disease on hemodialysis ([place]) 06/04/2011  . Systolic CHF, chronic ([place]) 05/19/2011    Class: Acute  . Hypertension, benign 05/19/2011    Class: Chronic  . DM II (diabetes mellitus, type II), controlled ([place]) 05/19/2011    Class: Chronic  . Gout, chronic 05/19/2011   Home Medication(s) Prior to Admission medications   Medication Sig Start Date End Date Taking?  Authorizing Provider  allopurinol (ZYLOPRIM) 300 MG tablet Take 150 mg by mouth every other day.   Yes [provider]  amiodarone (PACERONE) 200 MG tablet Take 0.5 tablets (100 mg total) by mouth daily. 07/21/17  Yes Regalado, Belkys A, MD  ferric citrate (AURYXIA) 1 GM 210 MG(Fe) tablet Take 210 mg by mouth 3 (three) times daily with meals.    Yes [provider]  lidocaine-prilocaine (EMLA) cream Apply 1 application topically See admin instructions. Apply topically to port access prior to dialysis - Monday, Wednesday, Friday 04/08/15  Yes [provider]  midodrine (PROAMATINE) 10 MG tablet Take 0.5 tablets (5 mg total) by mouth every Monday, Wednesday, and Friday with hemodialysis. Take on the morning of dialysis days Patient taking differently: Take 5-10 mg by mouth See admin instructions. Take one tablet (10 mg) by mouth before dialysis (Monday, Wednesday, Friday), take 1/2 tablet (5 mg) on non-dialysis days (Sunday, Tuesday, Thursday, Saturday) as needed for diastolic blood pressure <82. 03/18/17  Yes Rai, Ripudeep K, MD  multivitamin (RENA-VIT) TABS tablet Take 1 tablet by mouth at bedtime.   Yes [provider]  nitroGLYCERIN (NITROSTAT) 0.4 MG SL tablet Place 0.4 mg under the tongue every 5 (five) minutes as needed for chest pain.  05/20/15  Yes [provider]  pantoprazole (PROTONIX) 40 MG tablet Take 40 mg by mouth 2 (two) times daily.   Yes [provider]  warfarin (COUMADIN) 10 MG tablet Take 15 mg by mouth daily at 6 PM.   Yes [provider]                                                                                                                                    Past Surgical History Past Surgical History:  Procedure Laterality Date  . AV FISTULA PLACEMENT  03/29/2011   Procedure: ARTERIOVENOUS (AV) FISTULA CREATION;  Surgeon: Hinda Lenis, MD;  Location: [facility];  Service: Vascular;  Laterality: Left;  LEFT  RADIOCEPHALIC , Arteriovenous [id number])  . CARDIAC CATHETERIZATION  05/21/11  . CARDIAC CATHETERIZATION N/A 03/16/2016   Procedure: Left Heart Cath and Coronary Angiography;  Surgeon: Dixie Dials, MD;  Location: [place] CV LAB;  Service: Cardiovascular;  Laterality: N/A;  . CHOLECYSTECTOMY N/A 02/12/2016   Procedure: LAPAROSCOPIC CHOLECYSTECTOMY WITH INTRAOPERATIVE CHOLANGIOGRAM;  Surgeon: Jackolyn Confer, MD;  Location: [facility];  Service: General;  Laterality: N/A;  . COLONOSCOPY WITH PROPOFOL N/A 02/01/2017   Procedure: COLONOSCOPY WITH PROPOFOL;  Surgeon: Juanita Craver, MD;  Location: [facility];  Service: Endoscopy;  Laterality: N/A;  . dialysis cath placed    . ESOPHAGOGASTRODUODENOSCOPY (EGD) WITH PROPOFOL N/A 12/02/2015   Procedure: ESOPHAGOGASTRODUODENOSCOPY (EGD) WITH PROPOFOL;  Surgeon: Juanita Craver, MD;  Location: [facility];  Service: Endoscopy;  Laterality: N/A;  . FINGER SURGERY     L pinkie finger- ORIF- /w remaining hardware - 1990's    . HERNIA REPAIR N/A 0/93/2355   Umbilical hernia repair  . INSERTION OF DIALYSIS CATHETER N/A 01/27/2016   Procedure: INSERTION OF DIALYSIS CATHETER;  Surgeon: Waynetta Sandy, MD;  Location: [facility];  Service: Vascular;  Laterality: N/A;  . INSERTION OF MESH N/A 07/04/2012   Procedure: INSERTION OF MESH;  Surgeon: Madilyn Hook, DO;  Location: [facility];  Service: General;  Laterality: N/A;  . LAPAROSCOPIC LYSIS OF ADHESIONS N/A 02/12/2016   Procedure: LAPAROSCOPIC LYSIS OF ADHESIONS;  Surgeon: Jackolyn Confer, MD;  Location: [facility];  Service: General;  Laterality: N/A;  . LEFT AND RIGHT HEART CATHETERIZATION WITH CORONARY ANGIOGRAM N/A 08/04/2012   Procedure: LEFT AND RIGHT HEART CATHETERIZATION WITH CORONARY ANGIOGRAM;  Surgeon: Birdie Riddle, MD;  Location: [facility] CATH LAB;  Service: Cardiovascular;  Laterality: N/A;  . NEPHRECTOMY  2000   right  . REVISON OF ARTERIOVENOUS FISTULA Left 06/05/2013   Procedure: REVISON OF LEFT RADIOCEPHALIC   ARTERIOVENOUS FISTULA;  Surgeon: Conrad Quesada, MD;  Location: [facility];  Service: Vascular;  Laterality: Left;  . REVISON OF ARTERIOVENOUS FISTULA Left 01/27/2016   Procedure: REVISION OF LEFT UPPER EXTREMITY ARTERIOVENOUS FISTULA;  Surgeon: Waynetta Sandy, MD;  Location: [facility];  Service: Vascular;  Laterality: Left;  . REVISON OF ARTERIOVENOUS FISTULA Left 08/03/2016   Procedure: REVISON OF Left arm ARTERIOVENOUS FISTULA;  Surgeon: Elam Dutch, MD;  Location: [facility];  Service: Vascular;  Laterality: Left;  . REVISON OF ARTERIOVENOUS FISTULA Left 05/06/2017   Procedure: REVISION PLICATION OF ARTERIOVENOUS FISTULA LEFT ARM;  Surgeon: Rosetta Posner, MD;  Location: [facility];  Service: Vascular;  Laterality: Left;  . RIGHT HEART CATHETERIZATION N/A 05/21/2011   Procedure: RIGHT HEART CATH;  Surgeon: Birdie Riddle, MD;  Location: [facility] C[facility] LAB;  Service: Cardiovascular;  Laterality: N/A;  . smashed  1990's   "left pinky; have a plate in there"  . UMBILICAL HERNIA REPAIR N/A 07/04/2012   Procedure: LAPAROSCOPIC UMBILICAL HERNIA;  Surgeon: Madilyn Hook, DO;  Location: [facility];  Service: General;  Laterality: N/A;  . US ECHOCARDIOGRAPHY  05/20/11   Family History Family History  Problem Relation Age of Onset  . Hypertension Mother   . Kidney disease Mother     Social History Social History   Tobacco Use  . Smoking status: Never Smoker  . Smokeless tobacco: Never Used  Substance Use Topics  . Alcohol use: No    Alcohol/week: 0.0 standard drinks  . Drug use: No    Types: Cocaine    Comment: Past hx-none in 20 yrs   Allergies Ace inhibitors  Review of Systems Review of Systems All other systems are reviewed and are negative for acute change except as noted in the HPI  Physical Exam Vital Signs  I have reviewed the triage vital signs BP 119/74   Pulse 94   Temp 97.9 F (36.6 C)   Resp 18   SpO2 97%   Physical Exam  Constitutional: He is oriented to person, place, and time.  He appears well-developed and well-nourished. No distress.  HENT:  Head: Normocephalic and atraumatic.  Right Ear: External  ear normal.  Left Ear: External ear normal.  Nose: Nose normal.  Mouth/Throat: Mucous membranes are normal. No trismus in the jaw.  Eyes: Pupils are equal, round, and reactive to light. Conjunctivae and EOM are normal. Right eye exhibits no discharge. Left eye exhibits no discharge. No scleral icterus.  Neck: Normal range of motion and phonation normal. Neck supple.  Cardiovascular: Normal rate and regular rhythm. Exam reveals no gallop and no friction rub.  No murmur heard. Pulmonary/Chest: Effort normal and breath sounds normal. No stridor. No respiratory distress. He has no rales.  Abdominal: Soft. He exhibits no distension. There is tenderness in the left lower quadrant. There is guarding and CVA tenderness (left). There is no rigidity and no rebound.    Genitourinary: Penis normal. Uncircumcised.     Musculoskeletal: Normal range of motion. He exhibits no edema or tenderness.  Neurological: He is alert and oriented to person, place, and time.  Skin: Skin is warm and dry. No rash noted. He is not diaphoretic. No erythema.  Psychiatric: He has a normal mood and affect. His behavior is normal.  Vitals reviewed.   ED Results and Treatments Labs (all labs ordered are listed, but only abnormal results are displayed) Labs Reviewed  COMPREHENSIVE METABOLIC PANEL - Abnormal; Notable for the following components:      Result Value   Chloride 94 (*)    Glucose, Bld 104 (*)    BUN 39 (*)    Creatinine, Ser 12.64 (*)    Albumin 3.1 (*)    AST 10 (*)    GFR calc non Af Amer 4 (*)    GFR calc Af Amer 5 (*)    All other components within normal limits  CBC - Abnormal; Notable for the following components:   RBC 4.13 (*)    MCV 101.2 (*)    All other components within normal limits  PROTIME-INR - Abnormal; Notable for the following components:   Prothrombin  Time 43.1 (*)    INR 4.59 (*)    All other components within normal limits  LIPASE, BLOOD  URINALYSIS, ROUTINE W REFLEX MICROSCOPIC  CBC  PROTIME-INR  CBG MONITORING, ED  PREPARE FRESH FROZEN PLASMA  TYPE AND SCREEN                                                                                                                         EKG  EKG Interpretation  Date/Time:    Ventricular Rate:    PR Interval:    QRS Duration:   QT Interval:    QTC Calculation:   R Axis:     Text Interpretation:        Radiology Ct Abdomen Pelvis W Contrast  Result Date: 01/13/2018 CLINICAL DATA:  Abdominal pain, diverticulitis suspected. Dialysis patient with episode of hematuria today, left abdominal pain. EXAM: CT ABDOMEN AND PELVIS WITH CONTRAST TECHNIQUE: Multidetector CT imaging of the abdomen and pelvis was performed using the standard protocol following bolus administration  of intravenous contrast. CONTRAST:  170mL ISOVUE-300 IOPAMIDOL (ISOVUE-300) INJECTION 61% COMPARISON:  CT 02/22/2017 FINDINGS: Lower chest: Multi chamber cardiomegaly. Ground-glass and consolidative opacity in the dependent left lower lobe. No pleural fluid. Hepatobiliary: Small cyst in the left hepatic lobe, unchanged. No new hepatic lesion. Postcholecystectomy with small cystic duct remnant. No biliary dilatation. Pancreas: No ductal dilatation or inflammation. Spleen: Normal in size without focal abnormality. Splenule inferiorly. Adrenals/Urinary Tract: Normal adrenal glands. Post right nephrectomy without suspicious soft tissue density in the nephrectomy bed. There is an ovoid heterogeneous region in the upper left kidney measuring 3.5 x 4.5 cm that is new from prior exam, containing a punctate internal hyperdensity, axial image 26 series 3. Adjacent perinephric edema. Mild left hydroureteronephrosis containing high-density material. Otherwise left renal parenchymal atrophy and cystic changes of chronic renal disease. Urinary  bladder completely decompressed. Stomach/Bowel: Colonic diverticulosis without diverticulitis. Small colonic stool burden. Normal appendix. No small bowel dilatation, obstruction or inflammation. Stomach is nondistended. Vascular/Lymphatic: Aortic and branch atherosclerosis. No aneurysm. Multiple small retroperitoneal and upper abdominal lymph nodes not enlarged by size criteria. No bulky adenopathy. Reproductive: Prostate is unremarkable. Other: Fat containing umbilical hernia.  No ascites.  No free air. Musculoskeletal: Degenerative disc disease L4-L5. There are no acute or suspicious osseous abnormalities. IMPRESSION: 1. New 3.5 x 4.5 cm ovoid heterogeneous hyperdensity in the upper left kidney with surrounding stranding and small central focus of high-density material, concerning for acute bleed. Findings may be hematoma due to elevated INR versus underlying lesion such as renal cell carcinoma with hemorrhage. Additionally there is mild left hydroureteronephrosis with high-density material throughout the renal collecting system and ureter consistent with blood. 2. Colonic diverticulosis without diverticulitis. 3. Patchy opacity in the left lung base likely reactive atelectasis. 4.  Aortic Atherosclerosis (ICD10-I70.0). These results were called by telephone at the time of interpretation on 01/13/2018 at 12:44 am to Dr. Addison Lank , who verbally acknowledged these results. Electronically Signed   By: Jeb Levering M.D.   On: 01/13/2018 00:44   Pertinent labs & imaging results that were available during my care of the patient were reviewed by me and considered in my medical decision making (see chart for details).  Medications Ordered in ED Medications  oxyCODONE-acetaminophen (PERCOCET/ROXICET) 5-325 MG per tablet 1 tablet (1 tablet Oral Given 01/12/18 2153)  iopamidol (ISOVUE-300) 61 % injection (has no administration in time range)  phytonadione (VITAMIN K) 10 mg in dextrose 5 % 50 mL IVPB (10 mg  Intravenous New Bag/Given 01/13/18 0119)  0.9 %  sodium chloride infusion (has no administration in time range)  HYDROmorphone (DILAUDID) injection 0.5 mg (has no administration in time range)  allopurinol (ZYLOPRIM) tablet 150 mg (has no administration in time range)  amiodarone (PACERONE) tablet 100 mg (has no administration in time range)  ferric citrate (AURYXIA) tablet 210 mg (has no administration in time range)  midodrine (PROAMATINE) tablet 5-10 mg (has no administration in time range)  pantoprazole (PROTONIX) EC tablet 40 mg (has no administration in time range)  multivitamin (RENA-VIT) tablet 1 tablet (has no administration in time range)  acetaminophen (TYLENOL) tablet 650 mg (has no administration in time range)    Or  acetaminophen (TYLENOL) suppository 650 mg (has no administration in time range)  ondansetron (ZOFRAN) tablet 4 mg (has no administration in time range)    Or  ondansetron (ZOFRAN) injection 4 mg (has no administration in time range)  HYDROmorphone (DILAUDID) injection 0.5-1 mg (has no administration in time range)  ondansetron (  ZOFRAN-ODT) disintegrating tablet 4 mg (4 mg Oral Given 01/12/18 2153)  sodium chloride 0.9 % bolus 250 mL (250 mLs Intravenous New Bag/Given 01/12/18 2358)  HYDROmorphone (DILAUDID) injection 1 mg (1 mg Intravenous Given 01/12/18 2352)  iopamidol (ISOVUE-300) 61 % injection 100 mL (100 mLs Intravenous Contrast Given 01/13/18 0007)                                                                                                                                    Procedures Procedures  (including critical care time)  Medical Decision Making / ED Course I have reviewed the nursing notes for this encounter and the patient's prior records (if available in EHR or on provided paperwork).  Clinical Course as of Jan 14 135  Fri Jan 13, 2018  8372 CT notable for hemorrhagic mass in the left kidney.  There is active extrav and blood within the  urinary collecting system causing hydronephrosis. Patient given vitamin K and FFP.  Will consult interventional radiology to review the case for possible embolization. Will discuss case with medicine for admission and continued management.   [PC]  0104 Spoke with IR and they feel reversal and close monitoring should stop bleed. No need for emergent embolization at this time. They will follow along.    [PC]  0111 Hospitalist to admit for further management.   [PC]    Clinical Course User Index [PC] Cardama, Grayce Sessions, MD     Final Clinical Impression(s) / ED Diagnoses Final diagnoses:  Hemorrhage of cyst of native kidney  Supratherapeutic INR      This chart was dictated using voice recognition software.  Despite best efforts to proofread,  errors can occur which can change the documentation meaning.   Fatima Blank, MD 01/13/18 [phone]

## 2018-01-13 NOTE — Progress Notes (Signed)
500NS bolus going.  K Jaclyn Prime is running.  IR is at bedside discussing planned embolization.

## 2018-01-13 NOTE — Progress Notes (Signed)
Rt. 5 french femoral sheath removed at 1430.  VPAD and manual pressure used to achieve hemostasis at 1445.  Distal pulses intact. No acute complications. Pt tolerated well.  Site reviewed with Merrily Brittle. [place]

## 2018-01-13 NOTE — ED Notes (Signed)
Vit K infused and blood products initiated, Dr Leonette Monarch at bedside for evaluation and repeat labs ordered.

## 2018-01-13 NOTE — ED Notes (Signed)
Blood consent signed

## 2018-01-13 NOTE — ED Notes (Signed)
Patient transported to CT 

## 2018-01-13 NOTE — Sedation Documentation (Signed)
Patient becoming hypotensive and increasingly tachycardiac. MD Hoss aware. Fluid bolus started and rate of PRBC increased to 999.

## 2018-01-13 NOTE — ED Notes (Signed)
Nurse will draw labs. 

## 2018-01-14 ENCOUNTER — Inpatient Hospital Stay (HOSPITAL_COMMUNITY): Payer: Medicare Other

## 2018-01-14 DIAGNOSIS — R319 Hematuria, unspecified: Secondary | ICD-10-CM

## 2018-01-14 DIAGNOSIS — N186 End stage renal disease: Secondary | ICD-10-CM

## 2018-01-14 DIAGNOSIS — R578 Other shock: Secondary | ICD-10-CM

## 2018-01-14 DIAGNOSIS — Z992 Dependence on renal dialysis: Secondary | ICD-10-CM

## 2018-01-14 DIAGNOSIS — N2889 Other specified disorders of kidney and ureter: Secondary | ICD-10-CM

## 2018-01-14 LAB — BASIC METABOLIC PANEL
Anion gap: 14 (ref 5–15)
Anion gap: 16 — ABNORMAL HIGH (ref 5–15)
BUN: 63 mg/dL — ABNORMAL HIGH (ref 6–20)
BUN: 66 mg/dL — ABNORMAL HIGH (ref 6–20)
CO2: 26 mmol/L (ref 22–32)
CO2: 24 mmol/L (ref 22–32)
Calcium: 8.2 mg/dL — ABNORMAL LOW (ref 8.9–10.3)
Calcium: 8.5 mg/dL — ABNORMAL LOW (ref 8.9–10.3)
Chloride: 92 mmol/L — ABNORMAL LOW (ref 98–111)
Chloride: 92 mmol/L — ABNORMAL LOW (ref 98–111)
Creatinine, Ser: 14.46 mg/dL — ABNORMAL HIGH (ref 0.61–1.24)
Creatinine, Ser: 14.83 mg/dL — ABNORMAL HIGH (ref 0.61–1.24)
GFR calc Af Amer: 4 mL/min — ABNORMAL LOW (ref >60)
GFR calc Af Amer: 4 mL/min — ABNORMAL LOW (ref >60)
GFR calc non Af Amer: 3 mL/min — ABNORMAL LOW (ref >60)
GFR calc non Af Amer: 3 mL/min — ABNORMAL LOW (ref >60)
Glucose, Bld: 106 mg/dL — ABNORMAL HIGH (ref 70–99)
Glucose, Bld: 121 mg/dL — ABNORMAL HIGH (ref 70–99)
Potassium: 7.4 mmol/L (ref 3.5–5.1)
Potassium: 5.6 mmol/L — ABNORMAL HIGH (ref 3.5–5.1)
Sodium: 132 mmol/L — ABNORMAL LOW (ref 135–145)
Sodium: 132 mmol/L — ABNORMAL LOW (ref 135–145)

## 2018-01-14 LAB — GLUCOSE, CAPILLARY
Glucose-Capillary: 71 mg/dL (ref 70–99)
Glucose-Capillary: 77 mg/dL (ref 70–99)
Glucose-Capillary: 81 mg/dL (ref 70–99)
Glucose-Capillary: 93 mg/dL (ref 70–99)
Glucose-Capillary: 95 mg/dL (ref 70–99)
Glucose-Capillary: 96 mg/dL (ref 70–99)
Glucose-Capillary: 98 mg/dL (ref 70–99)

## 2018-01-14 LAB — PROCALCITONIN: Procalcitonin: 17.92 ng/mL

## 2018-01-14 LAB — MAGNESIUM: Magnesium: 2.5 mg/dL — ABNORMAL HIGH (ref 1.7–2.4)

## 2018-01-14 LAB — CBC
HCT: 31.3 % — ABNORMAL LOW (ref 39.0–52.0)
Hemoglobin: 10.3 g/dL — ABNORMAL LOW (ref 13.0–17.0)
MCH: 31.6 pg (ref 26.0–34.0)
MCHC: 32.9 g/dL (ref 30.0–36.0)
MCV: 96 fL (ref 78.0–100.0)
Platelets: 255 10*3/uL (ref 150–400)
RBC: 3.26 MIL/uL — ABNORMAL LOW (ref 4.22–5.81)
RDW: 16.8 % — ABNORMAL HIGH (ref 11.5–15.5)
WBC: 20.7 10*3/uL — ABNORMAL HIGH (ref 4.0–10.5)

## 2018-01-14 LAB — PHOSPHORUS: Phosphorus: 7.2 mg/dL — ABNORMAL HIGH (ref 2.5–4.6)

## 2018-01-14 LAB — BPAM FFP
Blood Product Expiration Date: 201908242359
ISSUE DATE / TIME: 201908230201
Unit Type and Rh: 6200

## 2018-01-14 LAB — PROTIME-INR
INR: 1.23
Prothrombin Time: 15.4 seconds — ABNORMAL HIGH (ref 11.4–15.2)

## 2018-01-14 LAB — PREPARE FRESH FROZEN PLASMA: Unit division: 0

## 2018-01-14 MED ORDER — CHLORHEXIDINE GLUCONATE CLOTH 2 % EX PADS
6.0000 | MEDICATED_PAD | Freq: Every day | CUTANEOUS | Status: DC
  Administered 2018-01-15 – 2018-01-20 (×4): 6 via TOPICAL

## 2018-01-14 MED ORDER — INSULIN ASPART 100 UNIT/ML ~~LOC~~ SOLN
10.0000 [IU] | Freq: Once | SUBCUTANEOUS | Status: AC
  Administered 2018-01-14: 10 [IU] via INTRAVENOUS

## 2018-01-14 MED ORDER — DEXTROSE 10 % IV SOLN
INTRAVENOUS | Status: AC
  Administered 2018-01-14: 08:00:00 via INTRAVENOUS

## 2018-01-14 MED ORDER — DEXTROSE 50 % IV SOLN
50.0000 mL | Freq: Once | INTRAVENOUS | Status: AC
  Administered 2018-01-14: 50 mL via INTRAVENOUS
  Filled 2018-01-14: qty 50

## 2018-01-14 MED ORDER — MIDODRINE HCL 5 MG PO TABS
10.0000 mg | ORAL_TABLET | Freq: Three times a day (TID) | ORAL | Status: DC
  Administered 2018-01-14 – 2018-01-23 (×24): 10 mg via ORAL
  Filled 2018-01-14 (×23): qty 2

## 2018-01-14 MED ORDER — SODIUM BICARBONATE 8.4 % IV SOLN
50.0000 meq | Freq: Once | INTRAVENOUS | Status: AC
  Administered 2018-01-14: 50 meq via INTRAVENOUS
  Filled 2018-01-14: qty 50

## 2018-01-14 MED ORDER — OXYCODONE HCL 5 MG PO TABS
5.0000 mg | ORAL_TABLET | Freq: Four times a day (QID) | ORAL | Status: DC | PRN
  Administered 2018-01-14 – 2018-01-22 (×9): 5 mg via ORAL
  Filled 2018-01-14 (×9): qty 1

## 2018-01-14 MED ORDER — SODIUM POLYSTYRENE SULFONATE 15 GM/60ML PO SUSP
15.0000 g | Freq: Four times a day (QID) | ORAL | Status: DC
  Filled 2018-01-14 (×2): qty 60

## 2018-01-14 MED ORDER — HYDROCODONE-ACETAMINOPHEN 7.5-325 MG PO TABS
1.0000 | ORAL_TABLET | Freq: Four times a day (QID) | ORAL | Status: DC | PRN
  Administered 2018-01-14 – 2018-01-22 (×11): 1 via ORAL
  Filled 2018-01-14 (×13): qty 1

## 2018-01-14 MED ORDER — DEXTROSE 10 % IV SOLN
INTRAVENOUS | Status: DC
  Administered 2018-01-14: 21:00:00 via INTRAVENOUS

## 2018-01-14 MED ORDER — SODIUM CHLORIDE 0.9 % IV SOLN
1.0000 g | Freq: Once | INTRAVENOUS | Status: AC
  Administered 2018-01-14: 1 g via INTRAVENOUS
  Filled 2018-01-14: qty 10

## 2018-01-14 MED ORDER — DOXERCALCIFEROL 4 MCG/2ML IV SOLN
2.0000 ug | INTRAVENOUS | Status: DC
  Administered 2018-01-16 – 2018-01-20 (×3): 2 ug via INTRAVENOUS
  Filled 2018-01-14 (×4): qty 2

## 2018-01-14 NOTE — Progress Notes (Signed)
Chart reviewed, patient remain in the ICU on pressor.

## 2018-01-14 NOTE — Progress Notes (Signed)
Subjective: Patient reports abdominal pain.  His hemoglobin was better today at 10.3.  He is continues on pressor without significant change in his blood pressure or heart rate.  He was dialyzed today.  He had one episode of gross hematuria today.  Objective: Vital signs in last 24 hours: Temp:  [97.7 F (36.5 C)-99.2 F (37.3 C)] 99.2 F (37.3 C) (08/24 1900) Pulse Rate:  [89-225] 107 (08/24 1900) Resp:  [19-33] 22 (08/24 1900) BP: (67-134)/(24-121) 109/78 (08/24 1900) SpO2:  [87 %-100 %] 99 % (08/24 1900) Weight:  [112.8 kg-113.9 kg] 113.9 kg (08/24 1747)  Intake/Output from previous day: 08/23 0701 - 08/24 0700 In: 2247 [I.V.:1371; Blood:416.3; IV Piggyback:459.7] Out: -  Intake/Output this shift: No intake/output data recorded.  Physical Exam:  No acute distress Alert and oriented Abdomen soft and nontender, bladder not distended on exam Extremity without calf swelling or pain  Lab Results: Recent Labs    01/13/18 1154 01/13/18 2022 01/14/18 0607  HGB 11.6* 11.5* 10.3*  HCT 36.0* 35.3* 31.3*   BMET Recent Labs    01/14/18 0431 01/14/18 0943  NA 132* 132*  K 7.4* 5.6*  CL 92* 92*  CO2 26 24  GLUCOSE 106* 121*  BUN 63* 66*  CREATININE 14.46* 14.83*  CALCIUM 8.2* 8.5*   Recent Labs    01/13/18 0830 01/13/18 1154 01/14/18 0431  INR 1.43 1.18 1.23   No results for input(s): LABURIN in the last 72 hours. Results for orders placed or performed during the hospital encounter of 01/12/18  MRSA PCR Screening     Status: None   Collection Time: 01/13/18 11:56 AM  Result Value Ref Range Status   MRSA by PCR NEGATIVE NEGATIVE Final    Comment:        The GeneXpert MRSA Assay (FDA approved for NASAL specimens only), is one component of a comprehensive MRSA colonization surveillance program. It is not intended to diagnose MRSA infection nor to guide or monitor treatment for MRSA infections. Performed at [facility], [place] [address]  [place], [place] [address]     Studies/Results: Ct Abdomen Pelvis W Contrast  Result Date: 01/13/2018 CLINICAL DATA:  Abdominal pain, diverticulitis suspected. Dialysis patient with episode of hematuria today, left abdominal pain. EXAM: CT ABDOMEN AND PELVIS WITH CONTRAST TECHNIQUE: Multidetector CT imaging of the abdomen and pelvis was performed using the standard protocol following bolus administration of intravenous contrast. CONTRAST:  168mL ISOVUE-300 IOPAMIDOL (ISOVUE-300) INJECTION 61% COMPARISON:  CT 02/22/2017 FINDINGS: Lower chest: Multi chamber cardiomegaly. Ground-glass and consolidative opacity in the dependent left lower lobe. No pleural fluid. Hepatobiliary: Small cyst in the left hepatic lobe, unchanged. No new hepatic lesion. Postcholecystectomy with small cystic duct remnant. No biliary dilatation. Pancreas: No ductal dilatation or inflammation. Spleen: Normal in size without focal abnormality. Splenule inferiorly. Adrenals/Urinary Tract: Normal adrenal glands. Post right nephrectomy without suspicious soft tissue density in the nephrectomy bed. There is an ovoid heterogeneous region in the upper left kidney measuring 3.5 x 4.5 cm that is new from prior exam, containing a punctate internal hyperdensity, axial image 26 series 3. Adjacent perinephric edema. Mild left hydroureteronephrosis containing high-density material. Otherwise left renal parenchymal atrophy and cystic changes of chronic renal disease. Urinary bladder completely decompressed. Stomach/Bowel: Colonic diverticulosis without diverticulitis. Small colonic stool burden. Normal appendix. No small bowel dilatation, obstruction or inflammation. Stomach is nondistended. Vascular/Lymphatic: Aortic and branch atherosclerosis. No aneurysm. Multiple small retroperitoneal and upper abdominal lymph nodes not enlarged by size criteria. No bulky  adenopathy. Reproductive: Prostate is unremarkable. Other: Fat containing umbilical hernia.  No  ascites.  No free air. Musculoskeletal: Degenerative disc disease L4-L5. There are no acute or suspicious osseous abnormalities. IMPRESSION: 1. New 3.5 x 4.5 cm ovoid heterogeneous hyperdensity in the upper left kidney with surrounding stranding and small central focus of high-density material, concerning for acute bleed. Findings may be hematoma due to elevated INR versus underlying lesion such as renal cell carcinoma with hemorrhage. Additionally there is mild left hydroureteronephrosis with high-density material throughout the renal collecting system and ureter consistent with blood. 2. Colonic diverticulosis without diverticulitis. 3. Patchy opacity in the left lung base likely reactive atelectasis. 4.  Aortic Atherosclerosis (ICD10-I70.0). These results were called by telephone at the time of interpretation on 01/13/2018 at 12:44 am to Dr. Addison Lank , who verbally acknowledged these results. Electronically Signed   By: Jeb Levering M.D.   On: 01/13/2018 00:44   Ir Angiogram Renal Left Selective  Result Date: 01/13/2018 INDICATION: Hypovolemic shock.  Left perinephric hematoma. EXAM: IR RIGHT FLOURO GUIDE CV LINE; IR EMBO ART VEN HEMORR LYMPH EXTRAV INC GUIDE ROADMAPPING; IR RENAL SELECTIVE UNI INC S+I MODERATE SEDATION; IR ULTRASOUND GUIDANCE VASC ACCESS RIGHT MEDICATIONS: None. The antibiotic was administered within 1 hour of the procedure ANESTHESIA/SEDATION: Moderate (conscious) sedation was employed during this procedure. A total of Versed 0.5 mg and Fentanyl 0 mcg was administered intravenously. Moderate Sedation Time: 57 minutes. The patient's level of consciousness and vital signs were monitored continuously by radiology nursing throughout the procedure under my direct supervision. CONTRAST:  90mL OMNIPAQUE IOHEXOL 300 MG/ML  SOLN FLUOROSCOPY TIME:  Fluoroscopy Time: 12 minutes 30 seconds (620 mGy). COMPLICATIONS: None immediate. PROCEDURE: Informed consent was obtained from the patient  following explanation of the procedure, risks, benefits and alternatives. The patient understands, agrees and consents for the procedure. All questions were addressed. A time out was performed prior to the initiation of the procedure. Maximal barrier sterile technique utilized including caps, mask, sterile gowns, sterile gloves, large sterile drape, hand hygiene, and Betadine prep. The right groin was prepped and draped in a sterile fashion. 1% lidocaine was utilized for local anesthesia. Under sonographic guidance, a micropuncture needle was inserted into the right common femoral artery. Sonographic documentation was obtained and the common femoral artery was noted to be patent. A 1 8 wire was up sized to a Bentson. Five French sheath was inserted. A 5 French pigtail catheter was advanced into the abdominal aorta and AP arteriography was performed. The pigtail was exchanged for a five [place] sauce Omni catheter was advanced over the wire into the thoracic aorta. The reverse curve was formed. The catheter was retracted. It was advanced over a Bentson wire into the left renal artery. Angiography was performed. A Renegade microcatheter was advanced through the sauce Omni catheter and over a 016 fathom wire into the peripheral branches of the left renal artery towards the upper pole. Contrast was injected confirming position in in the peripheral left renal arterial tree. Coil embolization was performed. Coiled utilized were 6 mm x 10 cm interlock, 6 mm x 20 cm interlock, 6 mm x 20 cm interlock. Repeat angiography through the microcatheter was performed. This demonstrated some residual flow through the coil pack. Gel-Foam slurry was then injected through the microcatheter until stasis of flow was achieved. Microcatheter was removed. The soft was retracted into the right common iliac artery and right femoral angiography was performed. The catheter was removed. The sheath was flushed and sewn  in place. Under sonographic  guidance, a micropuncture needle was inserted into the left common femoral vein and removed over a 018 wire which was up sized to a 3 J. Sonographic documentation was obtained. The right common femoral vein was noted to be patent. A 7 French triple-lumen catheter was advanced over the 035 3 J wire. It was flushed and sewn in place. FINDINGS: Aortic arteriography demonstrates left renal artery anatomy. There is significant narrowing just beyond its takeoff. The right renal artery is not clearly visualized and most likely has been ligated. Selective arteriography of the left renal artery delineates anatomy and demonstrates active extravasation involving the upper pole of the left kidney in multiple locations. Subsequent imaging demonstrates selective injection of third order branches of the left renal artery. Serial imaging demonstrates deposition of coils within the left upper pole renal artery branches extending into the main left renal artery. Final arteriography demonstrates occlusion of the left renal artery. Femoral arteriography delineates right femoral artery anatomy and sheath insertion. Final imaging demonstrates a right common femoral vein triple-lumen central venous catheter with its tip in the right common iliac vein. IMPRESSION: Successful coil embolization of the main left renal artery with stasis of flow and resolution of active extravasation of bleeding from the upper pole of the left kidney. Successful right common femoral vein central venous triple-lumen catheter. Electronically Signed   By: Marybelle Killings M.D.   On: 01/13/2018 13:50   Ir Fluoro Guide Cv Line Right  Result Date: 01/13/2018 INDICATION: Hypovolemic shock.  Left perinephric hematoma. EXAM: IR RIGHT FLOURO GUIDE CV LINE; IR EMBO ART VEN HEMORR LYMPH EXTRAV INC GUIDE ROADMAPPING; IR RENAL SELECTIVE UNI INC S+I MODERATE SEDATION; IR ULTRASOUND GUIDANCE VASC ACCESS RIGHT MEDICATIONS: None. The antibiotic was administered within 1 hour of  the procedure ANESTHESIA/SEDATION: Moderate (conscious) sedation was employed during this procedure. A total of Versed 0.5 mg and Fentanyl 0 mcg was administered intravenously. Moderate Sedation Time: 57 minutes. The patient's level of consciousness and vital signs were monitored continuously by radiology nursing throughout the procedure under my direct supervision. CONTRAST:  13mL OMNIPAQUE IOHEXOL 300 MG/ML  SOLN FLUOROSCOPY TIME:  Fluoroscopy Time: 12 minutes 30 seconds (620 mGy). COMPLICATIONS: None immediate. PROCEDURE: Informed consent was obtained from the patient following explanation of the procedure, risks, benefits and alternatives. The patient understands, agrees and consents for the procedure. All questions were addressed. A time out was performed prior to the initiation of the procedure. Maximal barrier sterile technique utilized including caps, mask, sterile gowns, sterile gloves, large sterile drape, hand hygiene, and Betadine prep. The right groin was prepped and draped in a sterile fashion. 1% lidocaine was utilized for local anesthesia. Under sonographic guidance, a micropuncture needle was inserted into the right common femoral artery. Sonographic documentation was obtained and the common femoral artery was noted to be patent. A 1 8 wire was up sized to a Bentson. Five French sheath was inserted. A 5 French pigtail catheter was advanced into the abdominal aorta and AP arteriography was performed. The pigtail was exchanged for a five [place] sauce Omni catheter was advanced over the wire into the thoracic aorta. The reverse curve was formed. The catheter was retracted. It was advanced over a Bentson wire into the left renal artery. Angiography was performed. A Renegade microcatheter was advanced through the sauce Omni catheter and over a 016 fathom wire into the peripheral branches of the left renal artery towards the upper pole. Contrast was injected confirming position in in the  peripheral left  renal arterial tree. Coil embolization was performed. Coiled utilized were 6 mm x 10 cm interlock, 6 mm x 20 cm interlock, 6 mm x 20 cm interlock. Repeat angiography through the microcatheter was performed. This demonstrated some residual flow through the coil pack. Gel-Foam slurry was then injected through the microcatheter until stasis of flow was achieved. Microcatheter was removed. The soft was retracted into the right common iliac artery and right femoral angiography was performed. The catheter was removed. The sheath was flushed and sewn in place. Under sonographic guidance, a micropuncture needle was inserted into the left common femoral vein and removed over a 018 wire which was up sized to a 3 J. Sonographic documentation was obtained. The right common femoral vein was noted to be patent. A 7 French triple-lumen catheter was advanced over the 035 3 J wire. It was flushed and sewn in place. FINDINGS: Aortic arteriography demonstrates left renal artery anatomy. There is significant narrowing just beyond its takeoff. The right renal artery is not clearly visualized and most likely has been ligated. Selective arteriography of the left renal artery delineates anatomy and demonstrates active extravasation involving the upper pole of the left kidney in multiple locations. Subsequent imaging demonstrates selective injection of third order branches of the left renal artery. Serial imaging demonstrates deposition of coils within the left upper pole renal artery branches extending into the main left renal artery. Final arteriography demonstrates occlusion of the left renal artery. Femoral arteriography delineates right femoral artery anatomy and sheath insertion. Final imaging demonstrates a right common femoral vein triple-lumen central venous catheter with its tip in the right common iliac vein. IMPRESSION: Successful coil embolization of the main left renal artery with stasis of flow and resolution of active  extravasation of bleeding from the upper pole of the left kidney. Successful right common femoral vein central venous triple-lumen catheter. Electronically Signed   By: Marybelle Killings M.D.   On: 01/13/2018 13:50   Ir US Guide Vasc Access Right  Result Date: 01/13/2018 INDICATION: Hypovolemic shock.  Left perinephric hematoma. EXAM: IR RIGHT FLOURO GUIDE CV LINE; IR EMBO ART VEN HEMORR LYMPH EXTRAV INC GUIDE ROADMAPPING; IR RENAL SELECTIVE UNI INC S+I MODERATE SEDATION; IR ULTRASOUND GUIDANCE VASC ACCESS RIGHT MEDICATIONS: None. The antibiotic was administered within 1 hour of the procedure ANESTHESIA/SEDATION: Moderate (conscious) sedation was employed during this procedure. A total of Versed 0.5 mg and Fentanyl 0 mcg was administered intravenously. Moderate Sedation Time: 57 minutes. The patient's level of consciousness and vital signs were monitored continuously by radiology nursing throughout the procedure under my direct supervision. CONTRAST:  16mL OMNIPAQUE IOHEXOL 300 MG/ML  SOLN FLUOROSCOPY TIME:  Fluoroscopy Time: 12 minutes 30 seconds (620 mGy). COMPLICATIONS: None immediate. PROCEDURE: Informed consent was obtained from the patient following explanation of the procedure, risks, benefits and alternatives. The patient understands, agrees and consents for the procedure. All questions were addressed. A time out was performed prior to the initiation of the procedure. Maximal barrier sterile technique utilized including caps, mask, sterile gowns, sterile gloves, large sterile drape, hand hygiene, and Betadine prep. The right groin was prepped and draped in a sterile fashion. 1% lidocaine was utilized for local anesthesia. Under sonographic guidance, a micropuncture needle was inserted into the right common femoral artery. Sonographic documentation was obtained and the common femoral artery was noted to be patent. A 1 8 wire was up sized to a Bentson. Five French sheath was inserted. A 5 French pigtail  catheter was advanced  into the abdominal aorta and AP arteriography was performed. The pigtail was exchanged for a five [place] sauce Omni catheter was advanced over the wire into the thoracic aorta. The reverse curve was formed. The catheter was retracted. It was advanced over a Bentson wire into the left renal artery. Angiography was performed. A Renegade microcatheter was advanced through the sauce Omni catheter and over a 016 fathom wire into the peripheral branches of the left renal artery towards the upper pole. Contrast was injected confirming position in in the peripheral left renal arterial tree. Coil embolization was performed. Coiled utilized were 6 mm x 10 cm interlock, 6 mm x 20 cm interlock, 6 mm x 20 cm interlock. Repeat angiography through the microcatheter was performed. This demonstrated some residual flow through the coil pack. Gel-Foam slurry was then injected through the microcatheter until stasis of flow was achieved. Microcatheter was removed. The soft was retracted into the right common iliac artery and right femoral angiography was performed. The catheter was removed. The sheath was flushed and sewn in place. Under sonographic guidance, a micropuncture needle was inserted into the left common femoral vein and removed over a 018 wire which was up sized to a 3 J. Sonographic documentation was obtained. The right common femoral vein was noted to be patent. A 7 French triple-lumen catheter was advanced over the 035 3 J wire. It was flushed and sewn in place. FINDINGS: Aortic arteriography demonstrates left renal artery anatomy. There is significant narrowing just beyond its takeoff. The right renal artery is not clearly visualized and most likely has been ligated. Selective arteriography of the left renal artery delineates anatomy and demonstrates active extravasation involving the upper pole of the left kidney in multiple locations. Subsequent imaging demonstrates selective injection of third  order branches of the left renal artery. Serial imaging demonstrates deposition of coils within the left upper pole renal artery branches extending into the main left renal artery. Final arteriography demonstrates occlusion of the left renal artery. Femoral arteriography delineates right femoral artery anatomy and sheath insertion. Final imaging demonstrates a right common femoral vein triple-lumen central venous catheter with its tip in the right common iliac vein. IMPRESSION: Successful coil embolization of the main left renal artery with stasis of flow and resolution of active extravasation of bleeding from the upper pole of the left kidney. Successful right common femoral vein central venous triple-lumen catheter. Electronically Signed   By: Marybelle Killings M.D.   On: 01/13/2018 13:50   Dg Chest Port 1 View  Result Date: 01/14/2018 CLINICAL DATA:  Congestive heart failure EXAM: PORTABLE CHEST 1 VIEW COMPARISON:  07/17/2017 FINDINGS: Chronic cardiomegaly. Abnormal density in the left lower chest probably secondary to a combination of pleural fluid and left lower lobe density. This could be simple atelectasis or pneumonia. The right chest is clear. No sign of pulmonary edema. IMPRESSION: Cardiomegaly. Left effusion. Abnormal left base pulmonary density which could be atelectasis and/or pneumonia. Electronically Signed   By: Nelson Chimes M.D.   On: 01/14/2018 13:00   Ct Renal Stone Study  Result Date: 01/13/2018 CLINICAL DATA:  Left renal hemorrhage with increased pain EXAM: CT ABDOMEN AND PELVIS WITHOUT CONTRAST TECHNIQUE: Multidetector CT imaging of the abdomen and pelvis was performed following the standard protocol without oral or IV contrast. COMPARISON:  January 13, 2018 study obtained earlier in the day FINDINGS: Lower chest: There is bibasilar atelectatic change. There are multiple foci coronary artery calcification. There is a small left pleural effusion. Hepatobiliary: There  is an 8 mm probable cyst  in the left lobe of the liver. No other focal liver lesions are evident. Gallbladder is absent. There is no biliary duct dilatation. Pancreas: No pancreatic mass or inflammatory focus. The pancreas is displaced anteriorly due to large amount of hemorrhage within the left perinephric fascia. Spleen: No splenic lesions are identified. Spleen is displaced laterally due to extensive left perirenal hemorrhage. Adrenals/Urinary Tract: Adrenals appear unremarkable. There has been extensive progression of hemorrhage throughout the left kidney and perirenal region. The left kidney is edematous with extensive hemorrhage throughout the left kidney distorting renal architecture. There is extensive soft tissue stranding and increased attenuation fluid throughout the perinephric fascia causing expansion of the perinephric fascia and displacement of adjacent structures. There is mass effect on the adjacent left psoas muscle medially as well as displacement the pancreas anteriorly in the spleen laterally. Hemorrhage tracks throughout the renal parenchyma diffusely, best appreciated on sagittal and coronal images. There are several cystic areas within the left kidney as well as the site of original hematoma in the upper pole region, less well-defined than earlier in the day. The right kidney is absent with surgical clips in the right pararenal region. No mass lesions seen in the right retroperitoneum. Urinary bladder is midline and collapsed. No calculus is seen in the left kidney or ureter. Stomach/Bowel: There is no appreciable bowel wall or mesenteric thickening. Loops of bowel are displaced medially due to the large pararenal hemorrhage on the left. No bowel obstruction. No free air or portal venous air evident. Vascular/Lymphatic: There is aortoiliac atherosclerosis. No aneurysm evident. No adenopathy appreciable. Reproductive: Prostate and seminal vesicles are normal in size and contour. Other: Appendix appears normal. There  is a ventral hernia containing only fat, stable. There is fat in each inguinal ring. Note that there is hemorrhage tracking from the pararenal region along the left retroperitoneum into a small inguinal hernia on the left. No abscess evident in the abdomen pelvis. No ascites. Musculoskeletal: There is degenerative change in the lumbar spine with vacuum phenomenon at L4-5. No blastic or lytic bone lesions. No intramuscular lesions are evident. IMPRESSION: 1. Extensive left perinephric hemorrhage with distortion of the renal parenchyma due to hemorrhage. Hemorrhage extends throughout the pararenal fascia with extension of the pararenal fascia and displacement of adjacent structures as noted. Fluid tracks from the left pararenal fascia inferiorly along the left pelvis to extend into a left inguinal hernia which earlier in the day contained only fat. 2.  Right kidney absent.  No lesions seen in right pararenal fossa. 3. No bowel obstruction. No abscess in the abdomen or pelvis. Appendix appears unremarkable. 4.  There is aortoiliac atherosclerosis. 5.  Small ventral hernia containing only fat. 6.  Small left pleural effusion. Aortic Atherosclerosis (ICD10-I70.0). Critical Value/emergent results were called by telephone at the time of interpretation on 01/13/2018 at 7:52 am to emergency department physician covering for Dr. Alcario Drought, who verbally acknowledged these results. Electronically Signed   By: Lowella Grip III M.D.   On: 01/13/2018 07:52   Ir Embo Art  Hazel Guide Roadmapping  Result Date: 01/13/2018 INDICATION: Hypovolemic shock.  Left perinephric hematoma. EXAM: IR RIGHT FLOURO GUIDE CV LINE; IR EMBO ART VEN HEMORR LYMPH EXTRAV INC GUIDE ROADMAPPING; IR RENAL SELECTIVE UNI INC S+I MODERATE SEDATION; IR ULTRASOUND GUIDANCE VASC ACCESS RIGHT MEDICATIONS: None. The antibiotic was administered within 1 hour of the procedure ANESTHESIA/SEDATION: Moderate (conscious) sedation was  employed during this procedure. A  total of Versed 0.5 mg and Fentanyl 0 mcg was administered intravenously. Moderate Sedation Time: 57 minutes. The patient's level of consciousness and vital signs were monitored continuously by radiology nursing throughout the procedure under my direct supervision. CONTRAST:  86mL OMNIPAQUE IOHEXOL 300 MG/ML  SOLN FLUOROSCOPY TIME:  Fluoroscopy Time: 12 minutes 30 seconds (620 mGy). COMPLICATIONS: None immediate. PROCEDURE: Informed consent was obtained from the patient following explanation of the procedure, risks, benefits and alternatives. The patient understands, agrees and consents for the procedure. All questions were addressed. A time out was performed prior to the initiation of the procedure. Maximal barrier sterile technique utilized including caps, mask, sterile gowns, sterile gloves, large sterile drape, hand hygiene, and Betadine prep. The right groin was prepped and draped in a sterile fashion. 1% lidocaine was utilized for local anesthesia. Under sonographic guidance, a micropuncture needle was inserted into the right common femoral artery. Sonographic documentation was obtained and the common femoral artery was noted to be patent. A 1 8 wire was up sized to a Bentson. Five French sheath was inserted. A 5 French pigtail catheter was advanced into the abdominal aorta and AP arteriography was performed. The pigtail was exchanged for a five [place] sauce Omni catheter was advanced over the wire into the thoracic aorta. The reverse curve was formed. The catheter was retracted. It was advanced over a Bentson wire into the left renal artery. Angiography was performed. A Renegade microcatheter was advanced through the sauce Omni catheter and over a 016 fathom wire into the peripheral branches of the left renal artery towards the upper pole. Contrast was injected confirming position in in the peripheral left renal arterial tree. Coil embolization was performed. Coiled utilized  were 6 mm x 10 cm interlock, 6 mm x 20 cm interlock, 6 mm x 20 cm interlock. Repeat angiography through the microcatheter was performed. This demonstrated some residual flow through the coil pack. Gel-Foam slurry was then injected through the microcatheter until stasis of flow was achieved. Microcatheter was removed. The soft was retracted into the right common iliac artery and right femoral angiography was performed. The catheter was removed. The sheath was flushed and sewn in place. Under sonographic guidance, a micropuncture needle was inserted into the left common femoral vein and removed over a 018 wire which was up sized to a 3 J. Sonographic documentation was obtained. The right common femoral vein was noted to be patent. A 7 French triple-lumen catheter was advanced over the 035 3 J wire. It was flushed and sewn in place. FINDINGS: Aortic arteriography demonstrates left renal artery anatomy. There is significant narrowing just beyond its takeoff. The right renal artery is not clearly visualized and most likely has been ligated. Selective arteriography of the left renal artery delineates anatomy and demonstrates active extravasation involving the upper pole of the left kidney in multiple locations. Subsequent imaging demonstrates selective injection of third order branches of the left renal artery. Serial imaging demonstrates deposition of coils within the left upper pole renal artery branches extending into the main left renal artery. Final arteriography demonstrates occlusion of the left renal artery. Femoral arteriography delineates right femoral artery anatomy and sheath insertion. Final imaging demonstrates a right common femoral vein triple-lumen central venous catheter with its tip in the right common iliac vein. IMPRESSION: Successful coil embolization of the main left renal artery with stasis of flow and resolution of active extravasation of bleeding from the upper pole of the left kidney. Successful  right common femoral vein central  venous triple-lumen catheter. Electronically Signed   By: Marybelle Killings M.D.   On: 01/13/2018 13:50    Assessment/Plan: Impression/Assessment:  Spontaneous left renal bleed ? Source and abnormal INR; risk factors for underlying malignancy and will need re-imaging months later. S/p embolization with IR 8/23 -   Plan:  -serial H/H - may need rescan if hgb drops.   Will follow.    LOS: 1 day   Festus Aloe 01/14/2018, 8:29 PM

## 2018-01-14 NOTE — Progress Notes (Signed)
Notified Katalina NP about critical lab, K+ 7.4. New orders received. Will continue to monitor.

## 2018-01-14 NOTE — Progress Notes (Signed)
Referring Physician(s): Dr Bernita Raisin  Supervising Physician: Marybelle Killings  Patient Status:  [facility] - In-pt  Chief Complaint:  Renal embolization in IR 8/23  Subjective:  On Pressors Head raised in bed Talking to me Coherent Complains of abd pain   Allergies: Ace inhibitors  Medications: Prior to Admission medications   Medication Sig Start Date End Date Taking? Authorizing Provider  allopurinol (ZYLOPRIM) 300 MG tablet Take 150 mg by mouth every other day.   Yes [provider]  amiodarone (PACERONE) 200 MG tablet Take 0.5 tablets (100 mg total) by mouth daily. 07/21/17  Yes Regalado, Belkys A, MD  ferric citrate (AURYXIA) 1 GM 210 MG(Fe) tablet Take 210 mg by mouth 3 (three) times daily with meals.    Yes [provider]  lidocaine-prilocaine (EMLA) cream Apply 1 application topically See admin instructions. Apply topically to port access prior to dialysis - Monday, Wednesday, Friday 04/08/15  Yes [provider]  midodrine (PROAMATINE) 10 MG tablet Take 0.5 tablets (5 mg total) by mouth every Monday, Wednesday, and Friday with hemodialysis. Take on the morning of dialysis days Patient taking differently: Take 5-10 mg by mouth See admin instructions. Take one tablet (10 mg) by mouth before dialysis (Monday, Wednesday, Friday), take 1/2 tablet (5 mg) on non-dialysis days (Sunday, Tuesday, Thursday, Saturday) as needed for diastolic blood pressure <43. 03/18/17  Yes Rai, Ripudeep K, MD  multivitamin (RENA-VIT) TABS tablet Take 1 tablet by mouth at bedtime.   Yes [provider]  nitroGLYCERIN (NITROSTAT) 0.4 MG SL tablet Place 0.4 mg under the tongue every 5 (five) minutes as needed for chest pain.  05/20/15  Yes [provider]  pantoprazole (PROTONIX) 40 MG tablet Take 40 mg by mouth 2 (two) times daily.   Yes [provider]  warfarin (COUMADIN) 10 MG tablet Take 15 mg by mouth daily at 6 PM.   Yes [provider]      Vital Signs: BP (!) 72/47   Pulse 98   Temp 97.7 F (36.5 C)   Resp (!) 23   Ht 6\' 1"  (1.854 m)   Wt 250 lb 14.1 oz (113.8 kg)   SpO2 96%   BMI 33.10 kg/m   Physical Exam  Abdominal: Soft. There is tenderness.  Neurological: He is alert.  Skin: Skin is warm.  Right groin site is clean and dry NT no bleeding Central line in place rt fem vein  Rt foot 2+ pulses  Vitals reviewed.   Imaging: Ct Abdomen Pelvis W Contrast  Result Date: 01/13/2018 CLINICAL DATA:  Abdominal pain, diverticulitis suspected. Dialysis patient with episode of hematuria today, left abdominal pain. EXAM: CT ABDOMEN AND PELVIS WITH CONTRAST TECHNIQUE: Multidetector CT imaging of the abdomen and pelvis was performed using the standard protocol following bolus administration of intravenous contrast. CONTRAST:  171mL ISOVUE-300 IOPAMIDOL (ISOVUE-300) INJECTION 61% COMPARISON:  CT 02/22/2017 FINDINGS: Lower chest: Multi chamber cardiomegaly. Ground-glass and consolidative opacity in the dependent left lower lobe. No pleural fluid. Hepatobiliary: Small cyst in the left hepatic lobe, unchanged. No new hepatic lesion. Postcholecystectomy with small cystic duct remnant. No biliary dilatation. Pancreas: No ductal dilatation or inflammation. Spleen: Normal in size without focal abnormality. Splenule inferiorly. Adrenals/Urinary Tract: Normal adrenal glands. Post right nephrectomy without suspicious soft tissue density in the nephrectomy bed. There is an ovoid heterogeneous region in the upper left kidney measuring 3.5 x 4.5 cm that is new from prior exam, containing a punctate internal hyperdensity, axial image 26  series 3. Adjacent perinephric edema. Mild left hydroureteronephrosis containing high-density material. Otherwise left renal parenchymal atrophy and cystic changes of chronic renal disease. Urinary bladder completely decompressed. Stomach/Bowel: Colonic diverticulosis without diverticulitis. Small colonic stool  burden. Normal appendix. No small bowel dilatation, obstruction or inflammation. Stomach is nondistended. Vascular/Lymphatic: Aortic and branch atherosclerosis. No aneurysm. Multiple small retroperitoneal and upper abdominal lymph nodes not enlarged by size criteria. No bulky adenopathy. Reproductive: Prostate is unremarkable. Other: Fat containing umbilical hernia.  No ascites.  No free air. Musculoskeletal: Degenerative disc disease L4-L5. There are no acute or suspicious osseous abnormalities. IMPRESSION: 1. New 3.5 x 4.5 cm ovoid heterogeneous hyperdensity in the upper left kidney with surrounding stranding and small central focus of high-density material, concerning for acute bleed. Findings may be hematoma due to elevated INR versus underlying lesion such as renal cell carcinoma with hemorrhage. Additionally there is mild left hydroureteronephrosis with high-density material throughout the renal collecting system and ureter consistent with blood. 2. Colonic diverticulosis without diverticulitis. 3. Patchy opacity in the left lung base likely reactive atelectasis. 4.  Aortic Atherosclerosis (ICD10-I70.0). These results were called by telephone at the time of interpretation on 01/13/2018 at 12:44 am to Dr. Addison Lank , who verbally acknowledged these results. Electronically Signed   By: Jeb Levering M.D.   On: 01/13/2018 00:44   Ir Angiogram Renal Left Selective  Result Date: 01/13/2018 INDICATION: Hypovolemic shock.  Left perinephric hematoma. EXAM: IR RIGHT FLOURO GUIDE CV LINE; IR EMBO ART VEN HEMORR LYMPH EXTRAV INC GUIDE ROADMAPPING; IR RENAL SELECTIVE UNI INC S+I MODERATE SEDATION; IR ULTRASOUND GUIDANCE VASC ACCESS RIGHT MEDICATIONS: None. The antibiotic was administered within 1 hour of the procedure ANESTHESIA/SEDATION: Moderate (conscious) sedation was employed during this procedure. A total of Versed 0.5 mg and Fentanyl 0 mcg was administered intravenously. Moderate Sedation Time: 57  minutes. The patient's level of consciousness and vital signs were monitored continuously by radiology nursing throughout the procedure under my direct supervision. CONTRAST:  66mL OMNIPAQUE IOHEXOL 300 MG/ML  SOLN FLUOROSCOPY TIME:  Fluoroscopy Time: 12 minutes 30 seconds (620 mGy). COMPLICATIONS: None immediate. PROCEDURE: Informed consent was obtained from the patient following explanation of the procedure, risks, benefits and alternatives. The patient understands, agrees and consents for the procedure. All questions were addressed. A time out was performed prior to the initiation of the procedure. Maximal barrier sterile technique utilized including caps, mask, sterile gowns, sterile gloves, large sterile drape, hand hygiene, and Betadine prep. The right groin was prepped and draped in a sterile fashion. 1% lidocaine was utilized for local anesthesia. Under sonographic guidance, a micropuncture needle was inserted into the right common femoral artery. Sonographic documentation was obtained and the common femoral artery was noted to be patent. A 1 8 wire was up sized to a Bentson. Five French sheath was inserted. A 5 French pigtail catheter was advanced into the abdominal aorta and AP arteriography was performed. The pigtail was exchanged for a five [place] sauce Omni catheter was advanced over the wire into the thoracic aorta. The reverse curve was formed. The catheter was retracted. It was advanced over a Bentson wire into the left renal artery. Angiography was performed. A Renegade microcatheter was advanced through the sauce Omni catheter and over a 016 fathom wire into the peripheral branches of the left renal artery towards the upper pole. Contrast was injected confirming position in in the peripheral left renal arterial tree. Coil embolization was performed. Coiled utilized were 6 mm x 10 cm interlock, 6  mm x 20 cm interlock, 6 mm x 20 cm interlock. Repeat angiography through the microcatheter was  performed. This demonstrated some residual flow through the coil pack. Gel-Foam slurry was then injected through the microcatheter until stasis of flow was achieved. Microcatheter was removed. The soft was retracted into the right common iliac artery and right femoral angiography was performed. The catheter was removed. The sheath was flushed and sewn in place. Under sonographic guidance, a micropuncture needle was inserted into the left common femoral vein and removed over a 018 wire which was up sized to a 3 J. Sonographic documentation was obtained. The right common femoral vein was noted to be patent. A 7 French triple-lumen catheter was advanced over the 035 3 J wire. It was flushed and sewn in place. FINDINGS: Aortic arteriography demonstrates left renal artery anatomy. There is significant narrowing just beyond its takeoff. The right renal artery is not clearly visualized and most likely has been ligated. Selective arteriography of the left renal artery delineates anatomy and demonstrates active extravasation involving the upper pole of the left kidney in multiple locations. Subsequent imaging demonstrates selective injection of third order branches of the left renal artery. Serial imaging demonstrates deposition of coils within the left upper pole renal artery branches extending into the main left renal artery. Final arteriography demonstrates occlusion of the left renal artery. Femoral arteriography delineates right femoral artery anatomy and sheath insertion. Final imaging demonstrates a right common femoral vein triple-lumen central venous catheter with its tip in the right common iliac vein. IMPRESSION: Successful coil embolization of the main left renal artery with stasis of flow and resolution of active extravasation of bleeding from the upper pole of the left kidney. Successful right common femoral vein central venous triple-lumen catheter. Electronically Signed   By: Marybelle Killings M.D.   On:  01/13/2018 13:50   Ir Fluoro Guide Cv Line Right  Result Date: 01/13/2018 INDICATION: Hypovolemic shock.  Left perinephric hematoma. EXAM: IR RIGHT FLOURO GUIDE CV LINE; IR EMBO ART VEN HEMORR LYMPH EXTRAV INC GUIDE ROADMAPPING; IR RENAL SELECTIVE UNI INC S+I MODERATE SEDATION; IR ULTRASOUND GUIDANCE VASC ACCESS RIGHT MEDICATIONS: None. The antibiotic was administered within 1 hour of the procedure ANESTHESIA/SEDATION: Moderate (conscious) sedation was employed during this procedure. A total of Versed 0.5 mg and Fentanyl 0 mcg was administered intravenously. Moderate Sedation Time: 57 minutes. The patient's level of consciousness and vital signs were monitored continuously by radiology nursing throughout the procedure under my direct supervision. CONTRAST:  62mL OMNIPAQUE IOHEXOL 300 MG/ML  SOLN FLUOROSCOPY TIME:  Fluoroscopy Time: 12 minutes 30 seconds (620 mGy). COMPLICATIONS: None immediate. PROCEDURE: Informed consent was obtained from the patient following explanation of the procedure, risks, benefits and alternatives. The patient understands, agrees and consents for the procedure. All questions were addressed. A time out was performed prior to the initiation of the procedure. Maximal barrier sterile technique utilized including caps, mask, sterile gowns, sterile gloves, large sterile drape, hand hygiene, and Betadine prep. The right groin was prepped and draped in a sterile fashion. 1% lidocaine was utilized for local anesthesia. Under sonographic guidance, a micropuncture needle was inserted into the right common femoral artery. Sonographic documentation was obtained and the common femoral artery was noted to be patent. A 1 8 wire was up sized to a Bentson. Five French sheath was inserted. A 5 French pigtail catheter was advanced into the abdominal aorta and AP arteriography was performed. The pigtail was exchanged for a five [place] sauce Omni catheter  was advanced over the wire into the thoracic aorta.  The reverse curve was formed. The catheter was retracted. It was advanced over a Bentson wire into the left renal artery. Angiography was performed. A Renegade microcatheter was advanced through the sauce Omni catheter and over a 016 fathom wire into the peripheral branches of the left renal artery towards the upper pole. Contrast was injected confirming position in in the peripheral left renal arterial tree. Coil embolization was performed. Coiled utilized were 6 mm x 10 cm interlock, 6 mm x 20 cm interlock, 6 mm x 20 cm interlock. Repeat angiography through the microcatheter was performed. This demonstrated some residual flow through the coil pack. Gel-Foam slurry was then injected through the microcatheter until stasis of flow was achieved. Microcatheter was removed. The soft was retracted into the right common iliac artery and right femoral angiography was performed. The catheter was removed. The sheath was flushed and sewn in place. Under sonographic guidance, a micropuncture needle was inserted into the left common femoral vein and removed over a 018 wire which was up sized to a 3 J. Sonographic documentation was obtained. The right common femoral vein was noted to be patent. A 7 French triple-lumen catheter was advanced over the 035 3 J wire. It was flushed and sewn in place. FINDINGS: Aortic arteriography demonstrates left renal artery anatomy. There is significant narrowing just beyond its takeoff. The right renal artery is not clearly visualized and most likely has been ligated. Selective arteriography of the left renal artery delineates anatomy and demonstrates active extravasation involving the upper pole of the left kidney in multiple locations. Subsequent imaging demonstrates selective injection of third order branches of the left renal artery. Serial imaging demonstrates deposition of coils within the left upper pole renal artery branches extending into the main left renal artery. Final arteriography  demonstrates occlusion of the left renal artery. Femoral arteriography delineates right femoral artery anatomy and sheath insertion. Final imaging demonstrates a right common femoral vein triple-lumen central venous catheter with its tip in the right common iliac vein. IMPRESSION: Successful coil embolization of the main left renal artery with stasis of flow and resolution of active extravasation of bleeding from the upper pole of the left kidney. Successful right common femoral vein central venous triple-lumen catheter. Electronically Signed   By: Marybelle Killings M.D.   On: 01/13/2018 13:50   Ir US Guide Vasc Access Right  Result Date: 01/13/2018 INDICATION: Hypovolemic shock.  Left perinephric hematoma. EXAM: IR RIGHT FLOURO GUIDE CV LINE; IR EMBO ART VEN HEMORR LYMPH EXTRAV INC GUIDE ROADMAPPING; IR RENAL SELECTIVE UNI INC S+I MODERATE SEDATION; IR ULTRASOUND GUIDANCE VASC ACCESS RIGHT MEDICATIONS: None. The antibiotic was administered within 1 hour of the procedure ANESTHESIA/SEDATION: Moderate (conscious) sedation was employed during this procedure. A total of Versed 0.5 mg and Fentanyl 0 mcg was administered intravenously. Moderate Sedation Time: 57 minutes. The patient's level of consciousness and vital signs were monitored continuously by radiology nursing throughout the procedure under my direct supervision. CONTRAST:  65mL OMNIPAQUE IOHEXOL 300 MG/ML  SOLN FLUOROSCOPY TIME:  Fluoroscopy Time: 12 minutes 30 seconds (620 mGy). COMPLICATIONS: None immediate. PROCEDURE: Informed consent was obtained from the patient following explanation of the procedure, risks, benefits and alternatives. The patient understands, agrees and consents for the procedure. All questions were addressed. A time out was performed prior to the initiation of the procedure. Maximal barrier sterile technique utilized including caps, mask, sterile gowns, sterile gloves, large sterile drape, hand hygiene, and Betadine  prep. The right groin  was prepped and draped in a sterile fashion. 1% lidocaine was utilized for local anesthesia. Under sonographic guidance, a micropuncture needle was inserted into the right common femoral artery. Sonographic documentation was obtained and the common femoral artery was noted to be patent. A 1 8 wire was up sized to a Bentson. Five French sheath was inserted. A 5 French pigtail catheter was advanced into the abdominal aorta and AP arteriography was performed. The pigtail was exchanged for a five [place] sauce Omni catheter was advanced over the wire into the thoracic aorta. The reverse curve was formed. The catheter was retracted. It was advanced over a Bentson wire into the left renal artery. Angiography was performed. A Renegade microcatheter was advanced through the sauce Omni catheter and over a 016 fathom wire into the peripheral branches of the left renal artery towards the upper pole. Contrast was injected confirming position in in the peripheral left renal arterial tree. Coil embolization was performed. Coiled utilized were 6 mm x 10 cm interlock, 6 mm x 20 cm interlock, 6 mm x 20 cm interlock. Repeat angiography through the microcatheter was performed. This demonstrated some residual flow through the coil pack. Gel-Foam slurry was then injected through the microcatheter until stasis of flow was achieved. Microcatheter was removed. The soft was retracted into the right common iliac artery and right femoral angiography was performed. The catheter was removed. The sheath was flushed and sewn in place. Under sonographic guidance, a micropuncture needle was inserted into the left common femoral vein and removed over a 018 wire which was up sized to a 3 J. Sonographic documentation was obtained. The right common femoral vein was noted to be patent. A 7 French triple-lumen catheter was advanced over the 035 3 J wire. It was flushed and sewn in place. FINDINGS: Aortic arteriography demonstrates left renal artery  anatomy. There is significant narrowing just beyond its takeoff. The right renal artery is not clearly visualized and most likely has been ligated. Selective arteriography of the left renal artery delineates anatomy and demonstrates active extravasation involving the upper pole of the left kidney in multiple locations. Subsequent imaging demonstrates selective injection of third order branches of the left renal artery. Serial imaging demonstrates deposition of coils within the left upper pole renal artery branches extending into the main left renal artery. Final arteriography demonstrates occlusion of the left renal artery. Femoral arteriography delineates right femoral artery anatomy and sheath insertion. Final imaging demonstrates a right common femoral vein triple-lumen central venous catheter with its tip in the right common iliac vein. IMPRESSION: Successful coil embolization of the main left renal artery with stasis of flow and resolution of active extravasation of bleeding from the upper pole of the left kidney. Successful right common femoral vein central venous triple-lumen catheter. Electronically Signed   By: Marybelle Killings M.D.   On: 01/13/2018 13:50   Ct Renal Stone Study  Result Date: 01/13/2018 CLINICAL DATA:  Left renal hemorrhage with increased pain EXAM: CT ABDOMEN AND PELVIS WITHOUT CONTRAST TECHNIQUE: Multidetector CT imaging of the abdomen and pelvis was performed following the standard protocol without oral or IV contrast. COMPARISON:  January 13, 2018 study obtained earlier in the day FINDINGS: Lower chest: There is bibasilar atelectatic change. There are multiple foci coronary artery calcification. There is a small left pleural effusion. Hepatobiliary: There is an 8 mm probable cyst in the left lobe of the liver. No other focal liver lesions are evident. Gallbladder is absent. There  is no biliary duct dilatation. Pancreas: No pancreatic mass or inflammatory focus. The pancreas is displaced  anteriorly due to large amount of hemorrhage within the left perinephric fascia. Spleen: No splenic lesions are identified. Spleen is displaced laterally due to extensive left perirenal hemorrhage. Adrenals/Urinary Tract: Adrenals appear unremarkable. There has been extensive progression of hemorrhage throughout the left kidney and perirenal region. The left kidney is edematous with extensive hemorrhage throughout the left kidney distorting renal architecture. There is extensive soft tissue stranding and increased attenuation fluid throughout the perinephric fascia causing expansion of the perinephric fascia and displacement of adjacent structures. There is mass effect on the adjacent left psoas muscle medially as well as displacement the pancreas anteriorly in the spleen laterally. Hemorrhage tracks throughout the renal parenchyma diffusely, best appreciated on sagittal and coronal images. There are several cystic areas within the left kidney as well as the site of original hematoma in the upper pole region, less well-defined than earlier in the day. The right kidney is absent with surgical clips in the right pararenal region. No mass lesions seen in the right retroperitoneum. Urinary bladder is midline and collapsed. No calculus is seen in the left kidney or ureter. Stomach/Bowel: There is no appreciable bowel wall or mesenteric thickening. Loops of bowel are displaced medially due to the large pararenal hemorrhage on the left. No bowel obstruction. No free air or portal venous air evident. Vascular/Lymphatic: There is aortoiliac atherosclerosis. No aneurysm evident. No adenopathy appreciable. Reproductive: Prostate and seminal vesicles are normal in size and contour. Other: Appendix appears normal. There is a ventral hernia containing only fat, stable. There is fat in each inguinal ring. Note that there is hemorrhage tracking from the pararenal region along the left retroperitoneum into a small inguinal hernia on  the left. No abscess evident in the abdomen pelvis. No ascites. Musculoskeletal: There is degenerative change in the lumbar spine with vacuum phenomenon at L4-5. No blastic or lytic bone lesions. No intramuscular lesions are evident. IMPRESSION: 1. Extensive left perinephric hemorrhage with distortion of the renal parenchyma due to hemorrhage. Hemorrhage extends throughout the pararenal fascia with extension of the pararenal fascia and displacement of adjacent structures as noted. Fluid tracks from the left pararenal fascia inferiorly along the left pelvis to extend into a left inguinal hernia which earlier in the day contained only fat. 2.  Right kidney absent.  No lesions seen in right pararenal fossa. 3. No bowel obstruction. No abscess in the abdomen or pelvis. Appendix appears unremarkable. 4.  There is aortoiliac atherosclerosis. 5.  Small ventral hernia containing only fat. 6.  Small left pleural effusion. Aortic Atherosclerosis (ICD10-I70.0). Critical Value/emergent results were called by telephone at the time of interpretation on 01/13/2018 at 7:52 am to emergency department physician covering for Dr. Alcario Drought, who verbally acknowledged these results. Electronically Signed   By: Lowella Grip III M.D.   On: 01/13/2018 07:52   Ir Embo Art  [place] Guide Roadmapping  Result Date: 01/13/2018 INDICATION: Hypovolemic shock.  Left perinephric hematoma. EXAM: IR RIGHT FLOURO GUIDE CV LINE; IR EMBO ART VEN HEMORR LYMPH EXTRAV INC GUIDE ROADMAPPING; IR RENAL SELECTIVE UNI INC S+I MODERATE SEDATION; IR ULTRASOUND GUIDANCE VASC ACCESS RIGHT MEDICATIONS: None. The antibiotic was administered within 1 hour of the procedure ANESTHESIA/SEDATION: Moderate (conscious) sedation was employed during this procedure. A total of Versed 0.5 mg and Fentanyl 0 mcg was administered intravenously. Moderate Sedation Time: 57 minutes. The patient's level of consciousness and vital signs  were monitored  continuously by radiology nursing throughout the procedure under my direct supervision. CONTRAST:  78mL OMNIPAQUE IOHEXOL 300 MG/ML  SOLN FLUOROSCOPY TIME:  Fluoroscopy Time: 12 minutes 30 seconds (620 mGy). COMPLICATIONS: None immediate. PROCEDURE: Informed consent was obtained from the patient following explanation of the procedure, risks, benefits and alternatives. The patient understands, agrees and consents for the procedure. All questions were addressed. A time out was performed prior to the initiation of the procedure. Maximal barrier sterile technique utilized including caps, mask, sterile gowns, sterile gloves, large sterile drape, hand hygiene, and Betadine prep. The right groin was prepped and draped in a sterile fashion. 1% lidocaine was utilized for local anesthesia. Under sonographic guidance, a micropuncture needle was inserted into the right common femoral artery. Sonographic documentation was obtained and the common femoral artery was noted to be patent. A 1 8 wire was up sized to a Bentson. Five French sheath was inserted. A 5 French pigtail catheter was advanced into the abdominal aorta and AP arteriography was performed. The pigtail was exchanged for a five [place] sauce Omni catheter was advanced over the wire into the thoracic aorta. The reverse curve was formed. The catheter was retracted. It was advanced over a Bentson wire into the left renal artery. Angiography was performed. A Renegade microcatheter was advanced through the sauce Omni catheter and over a 016 fathom wire into the peripheral branches of the left renal artery towards the upper pole. Contrast was injected confirming position in in the peripheral left renal arterial tree. Coil embolization was performed. Coiled utilized were 6 mm x 10 cm interlock, 6 mm x 20 cm interlock, 6 mm x 20 cm interlock. Repeat angiography through the microcatheter was performed. This demonstrated some residual flow through the coil pack. Gel-Foam slurry  was then injected through the microcatheter until stasis of flow was achieved. Microcatheter was removed. The soft was retracted into the right common iliac artery and right femoral angiography was performed. The catheter was removed. The sheath was flushed and sewn in place. Under sonographic guidance, a micropuncture needle was inserted into the left common femoral vein and removed over a 018 wire which was up sized to a 3 J. Sonographic documentation was obtained. The right common femoral vein was noted to be patent. A 7 French triple-lumen catheter was advanced over the 035 3 J wire. It was flushed and sewn in place. FINDINGS: Aortic arteriography demonstrates left renal artery anatomy. There is significant narrowing just beyond its takeoff. The right renal artery is not clearly visualized and most likely has been ligated. Selective arteriography of the left renal artery delineates anatomy and demonstrates active extravasation involving the upper pole of the left kidney in multiple locations. Subsequent imaging demonstrates selective injection of third order branches of the left renal artery. Serial imaging demonstrates deposition of coils within the left upper pole renal artery branches extending into the main left renal artery. Final arteriography demonstrates occlusion of the left renal artery. Femoral arteriography delineates right femoral artery anatomy and sheath insertion. Final imaging demonstrates a right common femoral vein triple-lumen central venous catheter with its tip in the right common iliac vein. IMPRESSION: Successful coil embolization of the main left renal artery with stasis of flow and resolution of active extravasation of bleeding from the upper pole of the left kidney. Successful right common femoral vein central venous triple-lumen catheter. Electronically Signed   By: Marybelle Killings M.D.   On: 01/13/2018 13:50    Labs:  CBC: Recent Labs  01/13/18 0830 01/13/18 1154  01/13/18 2022 01/14/18 0607  WBC 11.0* 22.0* 17.6* 20.7*  HGB 9.7* 11.6* 11.5* 10.3*  HCT 30.7* 36.0* 35.3* 31.3*  PLT 244 229 258 255    COAGS: Recent Labs    03/16/17 0900 03/17/17 0318  01/13/18 0526 01/13/18 0830 01/13/18 1154 01/14/18 0431  INR 1.40 1.73   < > 2.05 1.43 1.18 1.23  APTT 44* 52*  --   --   --   --   --    < > = values in this interval not displayed.    BMP: Recent Labs    07/21/17 0430 01/12/18 2159 01/13/18 0247 01/13/18 1047 01/14/18 0431  NA 137 136 131* 134* 132*  K 3.9 4.7 5.4* 5.9* 7.4*  CL 93* 94* 97* 93* 92*  CO2 30 28  --  21* 26  GLUCOSE 110* 104* 113* 228* 106*  BUN 13 39* 44* 49* 63*  CALCIUM 9.1 8.9  --  8.3* 8.2*  CREATININE 5.30* 12.64* 14.00* 13.96* 14.46*  GFRNONAA 11* 4*  --  3* 3*  GFRAA 13* 5*  --  4* 4*    LIVER FUNCTION TESTS: Recent Labs    02/22/17 0830 03/16/17 0900 03/17/17 0318 07/18/17 0531 07/20/17 0214 01/12/18 2159 01/13/18 1047  BILITOT 0.5 0.5 0.6  --   --  0.9  --   AST 20 20 18   --   --  10*  --   ALT 15* <5* <5*  --   --  8  --   ALKPHOS 114 86 80  --   --  68  --   PROT 8.4* 8.2* 7.2  --   --  7.8  --   ALBUMIN 3.4* 3.3* 2.9* 3.2* 3.1* 3.1* 2.6*    Assessment and Plan:  Right renal hemorrhage Renal embolization 8/23 in IR Plan per CCM  Electronically Signed: Denali Sharma A, PA-C 01/14/2018, 8:27 AM   I spent a total of 15 Minutes at the the patient's bedside AND on the patient's hospital floor or unit, greater than 50% of which was counseling/coordinating care for renal embolization

## 2018-01-14 NOTE — Progress Notes (Signed)
PULMONARY / CRITICAL CARE MEDICINE   Name: [name] MRN: [id number] DOB: [birth year]    ADMISSION DATE:  01/12/2018 CONSULTATION DATE:  01/13/18  REFERRING MD:  [name]  CHIEF COMPLAINT:  Hemorrhagic Shock   HISTORY OF PRESENT ILLNESS:   This is a 57 year old aam w/ sig h/o ESRD (MWF HD), prior right nephrectomy, a flutter (on amio and warfarin), CHF and chronic hypotension (on midodrine). Usually anuric but went to the BR on 8/22 and voided BRB. DX eval in ER: large renal hematoma 3.5x5cm w/ mild hydro. Hgb 13.6 and INR was 4.59  on arrival. Admitted w/ left renal hemorrhage w/ significant associated flank pain. Received gentle IVFs, analgesia and Kcentra.  -8/23 While in IR undergoing embolization became progressively hypotensive and tachycardic. PCCM asked to consult emergently. Getting PRBCs x 2 w/ excellent hemodynamic response  PAST MEDICAL HISTORY :  He  has a past medical history of Atrial flutter ([place]), Blind right eye, Cardiomyopathy, CHF (congestive heart failure) ([place]), Coronary artery disease, Diabetes mellitus, Dialysis patient [facility]), Diverticulitis (November 2016), ESRD (end stage renal disease) ([place]), GERD (gastroesophageal reflux disease), Gout, History of nephrectomy (07/04/2012), History of unilateral nephrectomy, Hypertension, Low iron, Myocardial infarction ([place]) (?2006), Renal cell carcinoma, Renal insufficiency, Shortness of breath (05/19/11), Stroke ([place]) (30/8657), and Umbilical hernia (84/69/62).  PAST SURGICAL HISTORY: He  has a past surgical history that includes Nephrectomy (2000); AV fistula placement (03/29/2011); smashed (1990's); US ECHOCARDIOGRAPHY (05/20/11); Finger surgery; Umbilical hernia repair (N/A, 07/04/2012); Insertion of mesh (N/A, 07/04/2012); Hernia repair (N/A, 07/04/2012); dialysis cath placed; Revison of arteriovenous fistula (Left, 06/05/2013); right heart catheterization (N/A, 05/21/2011); left and right heart catheterization with coronary angiogram (N/A,  08/04/2012); Esophagogastroduodenoscopy (egd) with propofol (N/A, 12/02/2015); Revison of arteriovenous fistula (Left, 01/27/2016); Insertion of dialysis catheter (N/A, 01/27/2016); Cholecystectomy (N/A, 02/12/2016); Laparoscopic lysis of adhesions (N/A, 02/12/2016); Cardiac catheterization (05/21/11); Cardiac catheterization (N/A, 03/16/2016); Revison of arteriovenous fistula (Left, 08/03/2016); Colonoscopy with propofol (N/A, 02/01/2017); Revison of arteriovenous fistula (Left, 05/06/2017); IR Fluoro Guide CV Line Right (01/13/2018); IR EMBO ART  VEN HEMORR LYMPH EXTRAV  INC GUIDE ROADMAPPING (01/13/2018); IR Angiogram Renal Left Selective (01/13/2018); and IR US Guide Vasc Access Right (01/13/2018).  Allergies  Allergen Reactions  . Ace Inhibitors Itching and Cough    No current facility-administered medications on file prior to encounter.    Current Outpatient Medications on File Prior to Encounter  Medication Sig  . allopurinol (ZYLOPRIM) 300 MG tablet Take 150 mg by mouth every other day.  Marland Kitchen amiodarone (PACERONE) 200 MG tablet Take 0.5 tablets (100 mg total) by mouth daily.  . ferric citrate (AURYXIA) 1 GM 210 MG(Fe) tablet Take 210 mg by mouth 3 (three) times daily with meals.   . lidocaine-prilocaine (EMLA) cream Apply 1 application topically See admin instructions. Apply topically to port access prior to dialysis - Monday, Wednesday, Friday  . midodrine (PROAMATINE) 10 MG tablet Take 0.5 tablets (5 mg total) by mouth every Monday, Wednesday, and Friday with hemodialysis. Take on the morning of dialysis days (Patient taking differently: Take 5-10 mg by mouth See admin instructions. Take one tablet (10 mg) by mouth before dialysis (Monday, Wednesday, Friday), take 1/2 tablet (5 mg) on non-dialysis days (Sunday, Tuesday, Thursday, Saturday) as needed for diastolic blood pressure <95.)  . multivitamin (RENA-VIT) TABS tablet Take 1 tablet by mouth at bedtime.  . nitroGLYCERIN (NITROSTAT) 0.4 MG SL tablet  Place 0.4 mg under the tongue every 5 (five) minutes as needed for chest pain.   Marland Kitchen  pantoprazole (PROTONIX) 40 MG tablet Take 40 mg by mouth 2 (two) times daily.  Marland Kitchen warfarin (COUMADIN) 10 MG tablet Take 15 mg by mouth daily at 6 PM.    FAMILY HISTORY:  His family history includes Hypertension in his mother; Kidney disease in his mother.  SOCIAL HISTORY: He  reports that he has never smoked. He has never used smokeless tobacco. He reports that he does not drink alcohol or use drugs.  SUBJECTIVE:  Hyperkalemic this am without significant EKG changes. Complaining of abdominal pain, given medication for nausea earlier.   VITAL SIGNS: BP (!) 107/25   Pulse 98   Temp 98 F (36.7 C) (Oral)   Resp (!) 24   Ht 6\' 1"  (1.854 m)   Wt 113.8 kg   SpO2 91%   BMI 33.10 kg/m    VENTILATOR SETTINGS:  On room air  INTAKE / OUTPUT: I/O last 3 completed shifts: In: 2744 [I.V.:1621; Blood:663.3; IV Piggyback:459.7] Out: -   PHYSICAL EXAMINATION: General:  NAD, obese, lying supine in bed Neuro:  A&Ox3, normal affect  HEENT:  [place]/AT,  Cardiovascular:  Rrr, no m/r/g, nml s1 s2 Lungs:  CTAB, no wheezing, rales, rhonchi   Abdomen:  +bs, NTTP, non-distended Musculoskeletal:  No pitting edema, + pedal pulses Skin:  C/d/i   LABS:  BMET Recent Labs  Lab 01/12/18 2159 01/13/18 0247 01/13/18 1047 01/14/18 0431  NA 136 131* 134* 132*  K 4.7 5.4* 5.9* 7.4*  CL 94* 97* 93* 92*  CO2 28  --  21* 26  BUN 39* 44* 49* 63*  CREATININE 12.64* 14.00* 13.96* 14.46*  GLUCOSE 104* 113* 228* 106*    Electrolytes Recent Labs  Lab 01/12/18 2159 01/13/18 1047 01/14/18 0431  CALCIUM 8.9 8.3* 8.2*  MG  --   --  2.5*  PHOS  --  7.3* 7.2*    CBC Recent Labs  Lab 01/13/18 1154 01/13/18 2022 01/14/18 0607  WBC 22.0* 17.6* 20.7*  HGB 11.6* 11.5* 10.3*  HCT 36.0* 35.3* 31.3*  PLT 229 258 255    Coag's Recent Labs  Lab 01/13/18 0830 01/13/18 1154 01/14/18 0431  INR 1.43 1.18 1.23     Sepsis Markers No results for input(s): LATICACIDVEN, PROCALCITON, O2SATVEN in the last 168 hours.  ABG No results for input(s): PHART, PCO2ART, PO2ART in the last 168 hours.  Liver Enzymes Recent Labs  Lab 01/12/18 2159 01/13/18 1047  AST 10*  --   ALT 8  --   ALKPHOS 68  --   BILITOT 0.9  --   ALBUMIN 3.1* 2.6*    Cardiac Enzymes Recent Labs  Lab 01/13/18 0526 01/13/18 1156 01/13/18 2022  TROPONINI 0.27* 0.21* 0.20*    Glucose Recent Labs  Lab 01/13/18 0127 01/13/18 1204 01/13/18 2030 01/13/18 2359 01/14/18 0311 01/14/18 0801  GLUCAP 94 190* 159* 98 95 71    Imaging Ir Angiogram Renal Left Selective  Result Date: 01/13/2018 INDICATION: Hypovolemic shock.  Left perinephric hematoma. EXAM: IR RIGHT FLOURO GUIDE CV LINE; IR EMBO ART VEN HEMORR LYMPH EXTRAV INC GUIDE ROADMAPPING; IR RENAL SELECTIVE UNI INC S+I MODERATE SEDATION; IR ULTRASOUND GUIDANCE VASC ACCESS RIGHT MEDICATIONS: None. The antibiotic was administered within 1 hour of the procedure ANESTHESIA/SEDATION: Moderate (conscious) sedation was employed during this procedure. A total of Versed 0.5 mg and Fentanyl 0 mcg was administered intravenously. Moderate Sedation Time: 57 minutes. The patient's level of consciousness and vital signs were monitored continuously by radiology nursing throughout the procedure under my direct  supervision. CONTRAST:  74mL OMNIPAQUE IOHEXOL 300 MG/ML  SOLN FLUOROSCOPY TIME:  Fluoroscopy Time: 12 minutes 30 seconds (620 mGy). COMPLICATIONS: None immediate. PROCEDURE: Informed consent was obtained from the patient following explanation of the procedure, risks, benefits and alternatives. The patient understands, agrees and consents for the procedure. All questions were addressed. A time out was performed prior to the initiation of the procedure. Maximal barrier sterile technique utilized including caps, mask, sterile gowns, sterile gloves, large sterile drape, hand hygiene, and  Betadine prep. The right groin was prepped and draped in a sterile fashion. 1% lidocaine was utilized for local anesthesia. Under sonographic guidance, a micropuncture needle was inserted into the right common femoral artery. Sonographic documentation was obtained and the common femoral artery was noted to be patent. A 1 8 wire was up sized to a Bentson. Five French sheath was inserted. A 5 French pigtail catheter was advanced into the abdominal aorta and AP arteriography was performed. The pigtail was exchanged for a five [place] sauce Omni catheter was advanced over the wire into the thoracic aorta. The reverse curve was formed. The catheter was retracted. It was advanced over a Bentson wire into the left renal artery. Angiography was performed. A Renegade microcatheter was advanced through the sauce Omni catheter and over a 016 fathom wire into the peripheral branches of the left renal artery towards the upper pole. Contrast was injected confirming position in in the peripheral left renal arterial tree. Coil embolization was performed. Coiled utilized were 6 mm x 10 cm interlock, 6 mm x 20 cm interlock, 6 mm x 20 cm interlock. Repeat angiography through the microcatheter was performed. This demonstrated some residual flow through the coil pack. Gel-Foam slurry was then injected through the microcatheter until stasis of flow was achieved. Microcatheter was removed. The soft was retracted into the right common iliac artery and right femoral angiography was performed. The catheter was removed. The sheath was flushed and sewn in place. Under sonographic guidance, a micropuncture needle was inserted into the left common femoral vein and removed over a 018 wire which was up sized to a 3 J. Sonographic documentation was obtained. The right common femoral vein was noted to be patent. A 7 French triple-lumen catheter was advanced over the 035 3 J wire. It was flushed and sewn in place. FINDINGS: Aortic arteriography  demonstrates left renal artery anatomy. There is significant narrowing just beyond its takeoff. The right renal artery is not clearly visualized and most likely has been ligated. Selective arteriography of the left renal artery delineates anatomy and demonstrates active extravasation involving the upper pole of the left kidney in multiple locations. Subsequent imaging demonstrates selective injection of third order branches of the left renal artery. Serial imaging demonstrates deposition of coils within the left upper pole renal artery branches extending into the main left renal artery. Final arteriography demonstrates occlusion of the left renal artery. Femoral arteriography delineates right femoral artery anatomy and sheath insertion. Final imaging demonstrates a right common femoral vein triple-lumen central venous catheter with its tip in the right common iliac vein. IMPRESSION: Successful coil embolization of the main left renal artery with stasis of flow and resolution of active extravasation of bleeding from the upper pole of the left kidney. Successful right common femoral vein central venous triple-lumen catheter. Electronically Signed   By: Marybelle Killings M.D.   On: 01/13/2018 13:50   Ir Fluoro Guide Cv Line Right  Result Date: 01/13/2018 INDICATION: Hypovolemic shock.  Left perinephric hematoma. EXAM:  IR RIGHT FLOURO GUIDE CV LINE; IR EMBO ART VEN HEMORR LYMPH EXTRAV INC GUIDE ROADMAPPING; IR RENAL SELECTIVE UNI INC S+I MODERATE SEDATION; IR ULTRASOUND GUIDANCE VASC ACCESS RIGHT MEDICATIONS: None. The antibiotic was administered within 1 hour of the procedure ANESTHESIA/SEDATION: Moderate (conscious) sedation was employed during this procedure. A total of Versed 0.5 mg and Fentanyl 0 mcg was administered intravenously. Moderate Sedation Time: 57 minutes. The patient's level of consciousness and vital signs were monitored continuously by radiology nursing throughout the procedure under my direct  supervision. CONTRAST:  17mL OMNIPAQUE IOHEXOL 300 MG/ML  SOLN FLUOROSCOPY TIME:  Fluoroscopy Time: 12 minutes 30 seconds (620 mGy). COMPLICATIONS: None immediate. PROCEDURE: Informed consent was obtained from the patient following explanation of the procedure, risks, benefits and alternatives. The patient understands, agrees and consents for the procedure. All questions were addressed. A time out was performed prior to the initiation of the procedure. Maximal barrier sterile technique utilized including caps, mask, sterile gowns, sterile gloves, large sterile drape, hand hygiene, and Betadine prep. The right groin was prepped and draped in a sterile fashion. 1% lidocaine was utilized for local anesthesia. Under sonographic guidance, a micropuncture needle was inserted into the right common femoral artery. Sonographic documentation was obtained and the common femoral artery was noted to be patent. A 1 8 wire was up sized to a Bentson. Five French sheath was inserted. A 5 French pigtail catheter was advanced into the abdominal aorta and AP arteriography was performed. The pigtail was exchanged for a five [place] sauce Omni catheter was advanced over the wire into the thoracic aorta. The reverse curve was formed. The catheter was retracted. It was advanced over a Bentson wire into the left renal artery. Angiography was performed. A Renegade microcatheter was advanced through the sauce Omni catheter and over a 016 fathom wire into the peripheral branches of the left renal artery towards the upper pole. Contrast was injected confirming position in in the peripheral left renal arterial tree. Coil embolization was performed. Coiled utilized were 6 mm x 10 cm interlock, 6 mm x 20 cm interlock, 6 mm x 20 cm interlock. Repeat angiography through the microcatheter was performed. This demonstrated some residual flow through the coil pack. Gel-Foam slurry was then injected through the microcatheter until stasis of flow was  achieved. Microcatheter was removed. The soft was retracted into the right common iliac artery and right femoral angiography was performed. The catheter was removed. The sheath was flushed and sewn in place. Under sonographic guidance, a micropuncture needle was inserted into the left common femoral vein and removed over a 018 wire which was up sized to a 3 J. Sonographic documentation was obtained. The right common femoral vein was noted to be patent. A 7 French triple-lumen catheter was advanced over the 035 3 J wire. It was flushed and sewn in place. FINDINGS: Aortic arteriography demonstrates left renal artery anatomy. There is significant narrowing just beyond its takeoff. The right renal artery is not clearly visualized and most likely has been ligated. Selective arteriography of the left renal artery delineates anatomy and demonstrates active extravasation involving the upper pole of the left kidney in multiple locations. Subsequent imaging demonstrates selective injection of third order branches of the left renal artery. Serial imaging demonstrates deposition of coils within the left upper pole renal artery branches extending into the main left renal artery. Final arteriography demonstrates occlusion of the left renal artery. Femoral arteriography delineates right femoral artery anatomy and sheath insertion. Final imaging demonstrates a  right common femoral vein triple-lumen central venous catheter with its tip in the right common iliac vein. IMPRESSION: Successful coil embolization of the main left renal artery with stasis of flow and resolution of active extravasation of bleeding from the upper pole of the left kidney. Successful right common femoral vein central venous triple-lumen catheter. Electronically Signed   By: Marybelle Killings M.D.   On: 01/13/2018 13:50   Ir US Guide Vasc Access Right  Result Date: 01/13/2018 INDICATION: Hypovolemic shock.  Left perinephric hematoma. EXAM: IR RIGHT FLOURO GUIDE  CV LINE; IR EMBO ART VEN HEMORR LYMPH EXTRAV INC GUIDE ROADMAPPING; IR RENAL SELECTIVE UNI INC S+I MODERATE SEDATION; IR ULTRASOUND GUIDANCE VASC ACCESS RIGHT MEDICATIONS: None. The antibiotic was administered within 1 hour of the procedure ANESTHESIA/SEDATION: Moderate (conscious) sedation was employed during this procedure. A total of Versed 0.5 mg and Fentanyl 0 mcg was administered intravenously. Moderate Sedation Time: 57 minutes. The patient's level of consciousness and vital signs were monitored continuously by radiology nursing throughout the procedure under my direct supervision. CONTRAST:  66mL OMNIPAQUE IOHEXOL 300 MG/ML  SOLN FLUOROSCOPY TIME:  Fluoroscopy Time: 12 minutes 30 seconds (620 mGy). COMPLICATIONS: None immediate. PROCEDURE: Informed consent was obtained from the patient following explanation of the procedure, risks, benefits and alternatives. The patient understands, agrees and consents for the procedure. All questions were addressed. A time out was performed prior to the initiation of the procedure. Maximal barrier sterile technique utilized including caps, mask, sterile gowns, sterile gloves, large sterile drape, hand hygiene, and Betadine prep. The right groin was prepped and draped in a sterile fashion. 1% lidocaine was utilized for local anesthesia. Under sonographic guidance, a micropuncture needle was inserted into the right common femoral artery. Sonographic documentation was obtained and the common femoral artery was noted to be patent. A 1 8 wire was up sized to a Bentson. Five French sheath was inserted. A 5 French pigtail catheter was advanced into the abdominal aorta and AP arteriography was performed. The pigtail was exchanged for a five [place] sauce Omni catheter was advanced over the wire into the thoracic aorta. The reverse curve was formed. The catheter was retracted. It was advanced over a Bentson wire into the left renal artery. Angiography was performed. A Renegade  microcatheter was advanced through the sauce Omni catheter and over a 016 fathom wire into the peripheral branches of the left renal artery towards the upper pole. Contrast was injected confirming position in in the peripheral left renal arterial tree. Coil embolization was performed. Coiled utilized were 6 mm x 10 cm interlock, 6 mm x 20 cm interlock, 6 mm x 20 cm interlock. Repeat angiography through the microcatheter was performed. This demonstrated some residual flow through the coil pack. Gel-Foam slurry was then injected through the microcatheter until stasis of flow was achieved. Microcatheter was removed. The soft was retracted into the right common iliac artery and right femoral angiography was performed. The catheter was removed. The sheath was flushed and sewn in place. Under sonographic guidance, a micropuncture needle was inserted into the left common femoral vein and removed over a 018 wire which was up sized to a 3 J. Sonographic documentation was obtained. The right common femoral vein was noted to be patent. A 7 French triple-lumen catheter was advanced over the 035 3 J wire. It was flushed and sewn in place. FINDINGS: Aortic arteriography demonstrates left renal artery anatomy. There is significant narrowing just beyond its takeoff. The right renal artery is not clearly  visualized and most likely has been ligated. Selective arteriography of the left renal artery delineates anatomy and demonstrates active extravasation involving the upper pole of the left kidney in multiple locations. Subsequent imaging demonstrates selective injection of third order branches of the left renal artery. Serial imaging demonstrates deposition of coils within the left upper pole renal artery branches extending into the main left renal artery. Final arteriography demonstrates occlusion of the left renal artery. Femoral arteriography delineates right femoral artery anatomy and sheath insertion. Final imaging demonstrates  a right common femoral vein triple-lumen central venous catheter with its tip in the right common iliac vein. IMPRESSION: Successful coil embolization of the main left renal artery with stasis of flow and resolution of active extravasation of bleeding from the upper pole of the left kidney. Successful right common femoral vein central venous triple-lumen catheter. Electronically Signed   By: Marybelle Killings M.D.   On: 01/13/2018 13:50   [place] Guide Roadmapping  Result Date: 01/13/2018 INDICATION: Hypovolemic shock.  Left perinephric hematoma. EXAM: IR RIGHT FLOURO GUIDE CV LINE; IR EMBO ART VEN HEMORR LYMPH EXTRAV INC GUIDE ROADMAPPING; IR RENAL SELECTIVE UNI INC S+I MODERATE SEDATION; IR ULTRASOUND GUIDANCE VASC ACCESS RIGHT MEDICATIONS: None. The antibiotic was administered within 1 hour of the procedure ANESTHESIA/SEDATION: Moderate (conscious) sedation was employed during this procedure. A total of Versed 0.5 mg and Fentanyl 0 mcg was administered intravenously. Moderate Sedation Time: 57 minutes. The patient's level of consciousness and vital signs were monitored continuously by radiology nursing throughout the procedure under my direct supervision. CONTRAST:  37mL OMNIPAQUE IOHEXOL 300 MG/ML  SOLN FLUOROSCOPY TIME:  Fluoroscopy Time: 12 minutes 30 seconds (620 mGy). COMPLICATIONS: None immediate. PROCEDURE: Informed consent was obtained from the patient following explanation of the procedure, risks, benefits and alternatives. The patient understands, agrees and consents for the procedure. All questions were addressed. A time out was performed prior to the initiation of the procedure. Maximal barrier sterile technique utilized including caps, mask, sterile gowns, sterile gloves, large sterile drape, hand hygiene, and Betadine prep. The right groin was prepped and draped in a sterile fashion. 1% lidocaine was utilized for local anesthesia. Under sonographic guidance, a  micropuncture needle was inserted into the right common femoral artery. Sonographic documentation was obtained and the common femoral artery was noted to be patent. A 1 8 wire was up sized to a Bentson. Five French sheath was inserted. A 5 French pigtail catheter was advanced into the abdominal aorta and AP arteriography was performed. The pigtail was exchanged for a five [place] sauce Omni catheter was advanced over the wire into the thoracic aorta. The reverse curve was formed. The catheter was retracted. It was advanced over a Bentson wire into the left renal artery. Angiography was performed. A Renegade microcatheter was advanced through the sauce Omni catheter and over a 016 fathom wire into the peripheral branches of the left renal artery towards the upper pole. Contrast was injected confirming position in in the peripheral left renal arterial tree. Coil embolization was performed. Coiled utilized were 6 mm x 10 cm interlock, 6 mm x 20 cm interlock, 6 mm x 20 cm interlock. Repeat angiography through the microcatheter was performed. This demonstrated some residual flow through the coil pack. Gel-Foam slurry was then injected through the microcatheter until stasis of flow was achieved. Microcatheter was removed. The soft was retracted into the right common iliac artery and right femoral angiography was performed. The catheter  was removed. The sheath was flushed and sewn in place. Under sonographic guidance, a micropuncture needle was inserted into the left common femoral vein and removed over a 018 wire which was up sized to a 3 J. Sonographic documentation was obtained. The right common femoral vein was noted to be patent. A 7 French triple-lumen catheter was advanced over the 035 3 J wire. It was flushed and sewn in place. FINDINGS: Aortic arteriography demonstrates left renal artery anatomy. There is significant narrowing just beyond its takeoff. The right renal artery is not clearly visualized and most likely  has been ligated. Selective arteriography of the left renal artery delineates anatomy and demonstrates active extravasation involving the upper pole of the left kidney in multiple locations. Subsequent imaging demonstrates selective injection of third order branches of the left renal artery. Serial imaging demonstrates deposition of coils within the left upper pole renal artery branches extending into the main left renal artery. Final arteriography demonstrates occlusion of the left renal artery. Femoral arteriography delineates right femoral artery anatomy and sheath insertion. Final imaging demonstrates a right common femoral vein triple-lumen central venous catheter with its tip in the right common iliac vein. IMPRESSION: Successful coil embolization of the main left renal artery with stasis of flow and resolution of active extravasation of bleeding from the upper pole of the left kidney. Successful right common femoral vein central venous triple-lumen catheter. Electronically Signed   By: Marybelle Killings M.D.   On: 01/13/2018 13:50     STUDIES:  CT abdomen pelvis 8/22: New 3.5 x 4.5 cm hypodensity in the left upper kidney with surrounding stranding and small central focus of high density material concerning for bleed underlying lesion not excluded.  Mild left hydroureteronephrosis.  Colonic diverticulosis without diverticulitis.  Mild left base atelectasis  CULTURES: none  ANTIBIOTICS: none  SIGNIFICANT EVENTS: 8/23 IR left renal artery embolization  AV Fistula left arm  LINES/TUBES: 8/23 - right femoral CVC   ASSESSMENT / PLAN:   Patient Active Problem List   Diagnosis Date Noted  . History of atrial flutter 01/13/2018  . Hemorrhage of left kidney 01/13/2018  . Acute respiratory failure with hypoxemia ([place]) 07/17/2017  . 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  . Acute on chronic combined systolic and diastolic CHF, NYHA class 4 ([place]) 07/17/2017  . Unresponsiveness 03/16/2017  . Hypotension  03/16/2017  . Paroxysmal atrial flutter ([place]) 03/16/2017  . Chronic pain 03/16/2017  . Leg pain, bilateral 03/16/2017  . Bile leak, postoperative 03/15/2016  . Elevated troponin 03/15/2016  . Rib pain on right side   . Biliary dyskinesia 02/12/2016  . Atrial flutter with rapid ventricular response ([place]) 06/27/2015  . Diverticulitis 04/21/2015  . Acute diverticulitis 04/21/2015  . Diverticulitis of intestine without perforation or abscess without bleeding 04/21/2015  . Pulmonary edema 01/13/2015  . Right upper quadrant abdominal pain 01/13/2015  . Acute respiratory failure with hypoxia ([place]) 01/13/2015  . Diverticulitis large intestine w/o perforation or abscess w/o bleeding 12/18/2014  . Hepatic cyst 12/18/2014  . Aftercare following surgery of the circulatory system, [place] 06/22/2013  . ESRD (end stage renal disease) on dialysis ([place]) 11/28/2012  . Right sided weakness 10/05/2012  . History of nephrectomy 07/04/2012  . End-stage renal disease on hemodialysis ([place]) 06/04/2011  . Systolic CHF, chronic ([place]) 05/19/2011    Class: Acute  . Hypertension, benign 05/19/2011    Class: Chronic  . DM II (diabetes mellitus, type II), controlled ([place]) 05/19/2011    Class: Chronic  . Gout, chronic  05/19/2011    Hemorrhagic shock in setting of left renal hemorrhage (multiple sites bleeding, required embolization of entire renal blood flow to the left kidney emergently on 8/23) exacerbated by Coumadin induced coagulopathy Cannot exclude underlying malignancy Status post 2 units PRBC as well as Kcentra on 8/23 Serial CBC and coags Norepi for SBP of 90 or MAP of 60 HD today per renal Hold anti-coagulation PRN pain control  Anemia of chronic disease.  Baseline hemoglobin 11.9.  Actually presented at 13.6.  Blood down to less than 10. See above, holding anticoagulation. 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  K+ 7.4. EDW 113.5.    Insulin/D5W - d510% for hypoglycemia  Albuterol  Nephro plans for HD today if BP stable  Diabetes type 2 Plan  Sliding scale insulin Hold oral agents  DVT: SCDs  FAMILY  - Updates: Patient updated bedside.  - Inter-disciplinary family meet or Palliative Care meeting due by:  8/  The patient is critically ill with multiple organ systems failure and requires high complexity decision making for assessment and support, frequent evaluation and titration of therapies, application of advanced monitoring technologies and extensive interpretation of multiple databases.   Critical Care Time devoted to patient care services described in this note is  32  Minutes. This time reflects time of care of this signee Dr Jennet Maduro. This critical care time does not reflect procedure time, or teaching time or supervisory time of PA/NP/Med student/Med Resident etc but could involve care discussion time.  Rush Farmer, M.D. [facility] Pulmonary/Critical Care Medicine. Pager: [phone]. After hours pager: [phone].

## 2018-01-14 NOTE — Progress Notes (Signed)
[facility] Progress Note  Subjective: no new c/o, L abd still hurting, Hb 10.3 this am.   7.4 this am, got IV Ca/ insulin/ glucose. Repeat is 5.6.  BP's soft on levo gtt.    Vitals:   01/14/18 1000 01/14/18 1015 01/14/18 1030 01/14/18 1100  BP: (!) 79/63 100/70 103/82 100/70  Pulse: (!) 103 (!) 101 (!) 103 95  Resp: (!) 23 (!) 23 (!) 24 (!) 26  Temp:      TempSrc:      SpO2: 98% 98% 95% 100%  Weight:      Height:        Inpatient medications: . allopurinol  150 mg Oral QODAY  . amiodarone  100 mg Oral Daily  . Chlorhexidine Gluconate Cloth  6 each Topical Daily  . Chlorhexidine Gluconate Cloth  6 each Topical Q0600  . ferric citrate  210 mg Oral TID WC  . insulin aspart  0-15 Units Subcutaneous Q4H  . midodrine  10 mg Oral TID WC  . multivitamin  1 tablet Oral QHS  . pantoprazole  40 mg Oral BID  . sodium chloride flush  10-40 mL Intracatheter Q12H   . dextrose 20 mL/hr at 01/14/18 1100  . norepinephrine (LEVOPHED) Adult infusion 20 mcg/min (01/14/18 1100)   acetaminophen **OR** acetaminophen, HYDROcodone-acetaminophen, ondansetron **OR** ondansetron (ZOFRAN) IV, sodium chloride flush  Iron/TIBC/Ferritin/ %Sat    Component Value Date/Time   IRON 40 (L) 05/22/2011 0600   TIBC 224 05/22/2011 0600   FERRITIN 422 (H) 05/22/2011 0600   IRONPCTSAT 18 (L) 05/22/2011 0600    Exam: General: WDWN male lying bed NAD  Head: NCAT sclera not icteric MMM Neck: Supple. No JVD No masses Lungs: CTA bilaterally without wheezes, rales, or rhonchi. Breathing is unlabored. Heart: RRR with S1 S2 Abdomen: soft NT + BS Lower extremities:without edema or ischemic changes, no open wounds  Neuro: A & O  X 3. Moves all extremities spontaneously. Psych:  Responds to questions appropriately with a normal affect. Dialysis Access: LUE AVF +bruit    Dialysis:  [place]  MWF  4h  111.5kg  2/2.25 bath  P4  LUE AVF  Hep 7000 +3000 midrun Hectorol 79mcg IV TIW Parsabiv 12.5mg  IV  TIW  Assessment: 1. Hyperkalemia - treated approp in ICU, plan HD this afternoon.  2. L renal hemorrhage - s/p L renal artery embolization by IR yest 8/23 3. Shock - remains on pressors, per CCM. Keep in mind that the patient is on midodrine as an outpatient for BP support. Baseline BP's are in the 90's w op hd   4. ESRD - MWF HD 5. Anemia of CKD/ abl - is sp 2 units prbc's yest, Hb 10.3 today 6. Metabolic bone disease -  Ca ok. Phos elevated. Resume binders when eating. Continue Hectorol.  7. Aflutter - on amiodarone. Holding Coumadin  8. Hx RCC s/p right nephrectomy 9. NICM EF 30-35%  10. DM   Plan - HD today at bedside   [place] pager [phone]   01/14/2018, 11:05 AM   Recent Labs  Lab 01/12/18 2159  01/13/18 1047 01/13/18 1154 01/14/18 0431 01/14/18 0943  NA 136   < > 134*  --  132* 132*  K 4.7   < > 5.9*  --  7.4* 5.6*  CL 94*   < > 93*  --  92* 92*  CO2 28  --  21*  --  26 24  GLUCOSE 104*   < >  228*  --  106* 121*  BUN 39*   < > 49*  --  63* 66*  CREATININE 12.64*   < > 13.96*  --  14.46* 14.83*  CALCIUM 8.9  --  8.3*  --  8.2* 8.5*  PHOS  --   --  7.3*  --  7.2*  --   ALBUMIN 3.1*  --  2.6*  --   --   --   INR 4.59*   < >  --  1.18 1.23  --    < > = values in this interval not displayed.   Recent Labs  Lab 01/12/18 2159  AST 10*  ALT 8  ALKPHOS 68  BILITOT 0.9  PROT 7.8   Recent Labs  Lab 01/13/18 2022 01/14/18 0607  WBC 17.6* 20.7*  HGB 11.5* 10.3*  HCT 35.3* 31.3*  MCV 97.0 96.0  PLT 258 255

## 2018-01-14 NOTE — Progress Notes (Signed)
PULMONARY / CRITICAL CARE MEDICINE   Name: [name] MRN: [id number] DOB: 16-Jun-1960    ADMISSION DATE:  01/12/2018 CONSULTATION DATE:  01/13/18  REFERRING MD:  Erlinda Hong  CHIEF COMPLAINT:  Hemorrhagic Shock   HISTORY OF PRESENT ILLNESS:   This is a 57 year old aam w/ sig h/o ESRD (MWF HD), prior right nephrectomy, a flutter (on amio and warfarin), CHF and chronic hypotension (on midodrine). Usually anuric but went to the BR on 8/22 and voided BRB. DX eval in ER: large renal hematoma 3.5x5cm w/ mild hydro. Hgb 13.6 and INR was 4.59  on arrival. Admitted w/ left renal hemorrhage w/ significant associated flank pain. Received gentle IVFs, analgesia and Kcentra.  -8/23 While in IR undergoing embolization became progressively hypotensive and tachycardic. PCCM asked to consult emergently. Getting PRBCs x 2 w/ excellent hemodynamic response  PAST MEDICAL HISTORY :  He  has a past medical history of Atrial flutter ([place]), Blind right eye, Cardiomyopathy, CHF (congestive heart failure) ([place]), Coronary artery disease, Diabetes mellitus, Dialysis patient [facility]), Diverticulitis (November 2016), ESRD (end stage renal disease) ([place]), GERD (gastroesophageal reflux disease), Gout, History of nephrectomy (07/04/2012), History of unilateral nephrectomy, Hypertension, Low iron, Myocardial infarction ([place]) (?2006), Renal cell carcinoma, Renal insufficiency, Shortness of breath (05/19/11), Stroke ([place]) (85/6314), and Umbilical hernia (97/02/63).  PAST SURGICAL HISTORY: He  has a past surgical history that includes Nephrectomy (2000); AV fistula placement (03/29/2011); smashed (1990's); US ECHOCARDIOGRAPHY (05/20/11); Finger surgery; Umbilical hernia repair (N/A, 07/04/2012); Insertion of mesh (N/A, 07/04/2012); Hernia repair (N/A, 07/04/2012); dialysis cath placed; Revison of arteriovenous fistula (Left, 06/05/2013); right heart catheterization (N/A, 05/21/2011); left and right heart catheterization with coronary angiogram (N/A,  08/04/2012); Esophagogastroduodenoscopy (egd) with propofol (N/A, 12/02/2015); Revison of arteriovenous fistula (Left, 01/27/2016); Insertion of dialysis catheter (N/A, 01/27/2016); Cholecystectomy (N/A, 02/12/2016); Laparoscopic lysis of adhesions (N/A, 02/12/2016); Cardiac catheterization (05/21/11); Cardiac catheterization (N/A, 03/16/2016); Revison of arteriovenous fistula (Left, 08/03/2016); Colonoscopy with propofol (N/A, 02/01/2017); Revison of arteriovenous fistula (Left, 05/06/2017); IR Fluoro Guide CV Line Right (01/13/2018); IR EMBO ART  VEN HEMORR LYMPH EXTRAV  INC GUIDE ROADMAPPING (01/13/2018); IR Angiogram Renal Left Selective (01/13/2018); and IR US Guide Vasc Access Right (01/13/2018).  Allergies  Allergen Reactions  . Ace Inhibitors Itching and Cough    No current facility-administered medications on file prior to encounter.    Current Outpatient Medications on File Prior to Encounter  Medication Sig  . allopurinol (ZYLOPRIM) 300 MG tablet Take 150 mg by mouth every other day.  Marland Kitchen amiodarone (PACERONE) 200 MG tablet Take 0.5 tablets (100 mg total) by mouth daily.  . ferric citrate (AURYXIA) 1 GM 210 MG(Fe) tablet Take 210 mg by mouth 3 (three) times daily with meals.   . lidocaine-prilocaine (EMLA) cream Apply 1 application topically See admin instructions. Apply topically to port access prior to dialysis - Monday, Wednesday, Friday  . midodrine (PROAMATINE) 10 MG tablet Take 0.5 tablets (5 mg total) by mouth every Monday, Wednesday, and Friday with hemodialysis. Take on the morning of dialysis days (Patient taking differently: Take 5-10 mg by mouth See admin instructions. Take one tablet (10 mg) by mouth before dialysis (Monday, Wednesday, Friday), take 1/2 tablet (5 mg) on non-dialysis days (Sunday, Tuesday, Thursday, Saturday) as needed for diastolic blood pressure <78.)  . multivitamin (RENA-VIT) TABS tablet Take 1 tablet by mouth at bedtime.  . nitroGLYCERIN (NITROSTAT) 0.4 MG SL tablet  Place 0.4 mg under the tongue every 5 (five) minutes as needed for chest pain.   Marland Kitchen  pantoprazole (PROTONIX) 40 MG tablet Take 40 mg by mouth 2 (two) times daily.  Marland Kitchen warfarin (COUMADIN) 10 MG tablet Take 15 mg by mouth daily at 6 PM.    FAMILY HISTORY:  His family history includes Hypertension in his mother; Kidney disease in his mother.  SOCIAL HISTORY: He  reports that he has never smoked. He has never used smokeless tobacco. He reports that he does not drink alcohol or use drugs.  SUBJECTIVE:  Hyperkalemic this am without significant EKG changes. Complaining of abdominal pain, given medication for nausea earlier.   VITAL SIGNS: BP (!) 77/59   Pulse (!) 109   Temp 97.7 F (36.5 C)   Resp (!) 24   Ht 6\' 1"  (1.854 m)   Wt 113.8 kg   SpO2 97%   BMI 33.10 kg/m    VENTILATOR SETTINGS:  On room air  INTAKE / OUTPUT: I/O last 3 completed shifts: In: 2744 [I.V.:1621; Blood:663.3; IV Piggyback:459.7] Out: -   PHYSICAL EXAMINATION: General:  NAD, obese, lying supine in bed Neuro:  A&Ox3, normal affect  HEENT:  [place]/AT,  Cardiovascular:  Rrr, no m/r/g, nml s1 s2 Lungs:  CTAB, no wheezing, rales, rhonchi   Abdomen:  +bs, NTTP, non-distended Musculoskeletal:  No pitting edema, + pedal pulses Skin:  C/d/i   LABS:  BMET Recent Labs  Lab 01/12/18 2159 01/13/18 0247 01/13/18 1047 01/14/18 0431  NA 136 131* 134* 132*  K 4.7 5.4* 5.9* 7.4*  CL 94* 97* 93* 92*  CO2 28  --  21* 26  BUN 39* 44* 49* 63*  CREATININE 12.64* 14.00* 13.96* 14.46*  GLUCOSE 104* 113* 228* 106*    Electrolytes Recent Labs  Lab 01/12/18 2159 01/13/18 1047 01/14/18 0431  CALCIUM 8.9 8.3* 8.2*  MG  --   --  2.5*  PHOS  --  7.3* 7.2*    CBC Recent Labs  Lab 01/13/18 1154 01/13/18 2022 01/14/18 0607  WBC 22.0* 17.6* 20.7*  HGB 11.6* 11.5* 10.3*  HCT 36.0* 35.3* 31.3*  PLT 229 258 255    Coag's Recent Labs  Lab 01/13/18 0830 01/13/18 1154 01/14/18 0431  INR 1.43 1.18 1.23     Sepsis Markers No results for input(s): LATICACIDVEN, PROCALCITON, O2SATVEN in the last 168 hours.  ABG No results for input(s): PHART, PCO2ART, PO2ART in the last 168 hours.  Liver Enzymes Recent Labs  Lab 01/12/18 2159 01/13/18 1047  AST 10*  --   ALT 8  --   ALKPHOS 68  --   BILITOT 0.9  --   ALBUMIN 3.1* 2.6*    Cardiac Enzymes Recent Labs  Lab 01/13/18 0526 01/13/18 1156 01/13/18 2022  TROPONINI 0.27* 0.21* 0.20*    Glucose Recent Labs  Lab 01/13/18 0127 01/13/18 1204 01/13/18 2030 01/13/18 2359 01/14/18 0311  GLUCAP 94 190* 159* 98 95    Imaging Ir Angiogram Renal Left Selective  Result Date: 01/13/2018 INDICATION: Hypovolemic shock.  Left perinephric hematoma. EXAM: IR RIGHT FLOURO GUIDE CV LINE; IR EMBO ART VEN HEMORR LYMPH EXTRAV INC GUIDE ROADMAPPING; IR RENAL SELECTIVE UNI INC S+I MODERATE SEDATION; IR ULTRASOUND GUIDANCE VASC ACCESS RIGHT MEDICATIONS: None. The antibiotic was administered within 1 hour of the procedure ANESTHESIA/SEDATION: Moderate (conscious) sedation was employed during this procedure. A total of Versed 0.5 mg and Fentanyl 0 mcg was administered intravenously. Moderate Sedation Time: 57 minutes. The patient's level of consciousness and vital signs were monitored continuously by radiology nursing throughout the procedure under my direct supervision. CONTRAST:  21mL OMNIPAQUE IOHEXOL 300 MG/ML  SOLN FLUOROSCOPY TIME:  Fluoroscopy Time: 12 minutes 30 seconds (620 mGy). COMPLICATIONS: None immediate. PROCEDURE: Informed consent was obtained from the patient following explanation of the procedure, risks, benefits and alternatives. The patient understands, agrees and consents for the procedure. All questions were addressed. A time out was performed prior to the initiation of the procedure. Maximal barrier sterile technique utilized including caps, mask, sterile gowns, sterile gloves, large sterile drape, hand hygiene, and Betadine prep. The  right groin was prepped and draped in a sterile fashion. 1% lidocaine was utilized for local anesthesia. Under sonographic guidance, a micropuncture needle was inserted into the right common femoral artery. Sonographic documentation was obtained and the common femoral artery was noted to be patent. A 1 8 wire was up sized to a Bentson. Five French sheath was inserted. A 5 French pigtail catheter was advanced into the abdominal aorta and AP arteriography was performed. The pigtail was exchanged for a five [place] sauce Omni catheter was advanced over the wire into the thoracic aorta. The reverse curve was formed. The catheter was retracted. It was advanced over a Bentson wire into the left renal artery. Angiography was performed. A Renegade microcatheter was advanced through the sauce Omni catheter and over a 016 fathom wire into the peripheral branches of the left renal artery towards the upper pole. Contrast was injected confirming position in in the peripheral left renal arterial tree. Coil embolization was performed. Coiled utilized were 6 mm x 10 cm interlock, 6 mm x 20 cm interlock, 6 mm x 20 cm interlock. Repeat angiography through the microcatheter was performed. This demonstrated some residual flow through the coil pack. Gel-Foam slurry was then injected through the microcatheter until stasis of flow was achieved. Microcatheter was removed. The soft was retracted into the right common iliac artery and right femoral angiography was performed. The catheter was removed. The sheath was flushed and sewn in place. Under sonographic guidance, a micropuncture needle was inserted into the left common femoral vein and removed over a 018 wire which was up sized to a 3 J. Sonographic documentation was obtained. The right common femoral vein was noted to be patent. A 7 French triple-lumen catheter was advanced over the 035 3 J wire. It was flushed and sewn in place. FINDINGS: Aortic arteriography demonstrates left renal  artery anatomy. There is significant narrowing just beyond its takeoff. The right renal artery is not clearly visualized and most likely has been ligated. Selective arteriography of the left renal artery delineates anatomy and demonstrates active extravasation involving the upper pole of the left kidney in multiple locations. Subsequent imaging demonstrates selective injection of third order branches of the left renal artery. Serial imaging demonstrates deposition of coils within the left upper pole renal artery branches extending into the main left renal artery. Final arteriography demonstrates occlusion of the left renal artery. Femoral arteriography delineates right femoral artery anatomy and sheath insertion. Final imaging demonstrates a right common femoral vein triple-lumen central venous catheter with its tip in the right common iliac vein. IMPRESSION: Successful coil embolization of the main left renal artery with stasis of flow and resolution of active extravasation of bleeding from the upper pole of the left kidney. Successful right common femoral vein central venous triple-lumen catheter. Electronically Signed   By: Marybelle Killings M.D.   On: 01/13/2018 13:50   Ir Fluoro Guide Cv Line Right  Result Date: 01/13/2018 INDICATION: Hypovolemic shock.  Left perinephric hematoma. EXAM: IR RIGHT FLOURO  GUIDE CV LINE; IR EMBO ART VEN HEMORR LYMPH EXTRAV INC GUIDE ROADMAPPING; IR RENAL SELECTIVE UNI INC S+I MODERATE SEDATION; IR ULTRASOUND GUIDANCE VASC ACCESS RIGHT MEDICATIONS: None. The antibiotic was administered within 1 hour of the procedure ANESTHESIA/SEDATION: Moderate (conscious) sedation was employed during this procedure. A total of Versed 0.5 mg and Fentanyl 0 mcg was administered intravenously. Moderate Sedation Time: 57 minutes. The patient's level of consciousness and vital signs were monitored continuously by radiology nursing throughout the procedure under my direct supervision. CONTRAST:  42mL  OMNIPAQUE IOHEXOL 300 MG/ML  SOLN FLUOROSCOPY TIME:  Fluoroscopy Time: 12 minutes 30 seconds (620 mGy). COMPLICATIONS: None immediate. PROCEDURE: Informed consent was obtained from the patient following explanation of the procedure, risks, benefits and alternatives. The patient understands, agrees and consents for the procedure. All questions were addressed. A time out was performed prior to the initiation of the procedure. Maximal barrier sterile technique utilized including caps, mask, sterile gowns, sterile gloves, large sterile drape, hand hygiene, and Betadine prep. The right groin was prepped and draped in a sterile fashion. 1% lidocaine was utilized for local anesthesia. Under sonographic guidance, a micropuncture needle was inserted into the right common femoral artery. Sonographic documentation was obtained and the common femoral artery was noted to be patent. A 1 8 wire was up sized to a Bentson. Five French sheath was inserted. A 5 French pigtail catheter was advanced into the abdominal aorta and AP arteriography was performed. The pigtail was exchanged for a five [place] sauce Omni catheter was advanced over the wire into the thoracic aorta. The reverse curve was formed. The catheter was retracted. It was advanced over a Bentson wire into the left renal artery. Angiography was performed. A Renegade microcatheter was advanced through the sauce Omni catheter and over a 016 fathom wire into the peripheral branches of the left renal artery towards the upper pole. Contrast was injected confirming position in in the peripheral left renal arterial tree. Coil embolization was performed. Coiled utilized were 6 mm x 10 cm interlock, 6 mm x 20 cm interlock, 6 mm x 20 cm interlock. Repeat angiography through the microcatheter was performed. This demonstrated some residual flow through the coil pack. Gel-Foam slurry was then injected through the microcatheter until stasis of flow was achieved. Microcatheter was  removed. The soft was retracted into the right common iliac artery and right femoral angiography was performed. The catheter was removed. The sheath was flushed and sewn in place. Under sonographic guidance, a micropuncture needle was inserted into the left common femoral vein and removed over a 018 wire which was up sized to a 3 J. Sonographic documentation was obtained. The right common femoral vein was noted to be patent. A 7 French triple-lumen catheter was advanced over the 035 3 J wire. It was flushed and sewn in place. FINDINGS: Aortic arteriography demonstrates left renal artery anatomy. There is significant narrowing just beyond its takeoff. The right renal artery is not clearly visualized and most likely has been ligated. Selective arteriography of the left renal artery delineates anatomy and demonstrates active extravasation involving the upper pole of the left kidney in multiple locations. Subsequent imaging demonstrates selective injection of third order branches of the left renal artery. Serial imaging demonstrates deposition of coils within the left upper pole renal artery branches extending into the main left renal artery. Final arteriography demonstrates occlusion of the left renal artery. Femoral arteriography delineates right femoral artery anatomy and sheath insertion. Final imaging demonstrates a right common femoral  vein triple-lumen central venous catheter with its tip in the right common iliac vein. IMPRESSION: Successful coil embolization of the main left renal artery with stasis of flow and resolution of active extravasation of bleeding from the upper pole of the left kidney. Successful right common femoral vein central venous triple-lumen catheter. Electronically Signed   By: Marybelle Killings M.D.   On: 01/13/2018 13:50   Ir US Guide Vasc Access Right  Result Date: 01/13/2018 INDICATION: Hypovolemic shock.  Left perinephric hematoma. EXAM: IR RIGHT FLOURO GUIDE CV LINE; IR EMBO ART VEN  HEMORR LYMPH EXTRAV INC GUIDE ROADMAPPING; IR RENAL SELECTIVE UNI INC S+I MODERATE SEDATION; IR ULTRASOUND GUIDANCE VASC ACCESS RIGHT MEDICATIONS: None. The antibiotic was administered within 1 hour of the procedure ANESTHESIA/SEDATION: Moderate (conscious) sedation was employed during this procedure. A total of Versed 0.5 mg and Fentanyl 0 mcg was administered intravenously. Moderate Sedation Time: 57 minutes. The patient's level of consciousness and vital signs were monitored continuously by radiology nursing throughout the procedure under my direct supervision. CONTRAST:  64mL OMNIPAQUE IOHEXOL 300 MG/ML  SOLN FLUOROSCOPY TIME:  Fluoroscopy Time: 12 minutes 30 seconds (620 mGy). COMPLICATIONS: None immediate. PROCEDURE: Informed consent was obtained from the patient following explanation of the procedure, risks, benefits and alternatives. The patient understands, agrees and consents for the procedure. All questions were addressed. A time out was performed prior to the initiation of the procedure. Maximal barrier sterile technique utilized including caps, mask, sterile gowns, sterile gloves, large sterile drape, hand hygiene, and Betadine prep. The right groin was prepped and draped in a sterile fashion. 1% lidocaine was utilized for local anesthesia. Under sonographic guidance, a micropuncture needle was inserted into the right common femoral artery. Sonographic documentation was obtained and the common femoral artery was noted to be patent. A 1 8 wire was up sized to a Bentson. Five French sheath was inserted. A 5 French pigtail catheter was advanced into the abdominal aorta and AP arteriography was performed. The pigtail was exchanged for a five [place] sauce Omni catheter was advanced over the wire into the thoracic aorta. The reverse curve was formed. The catheter was retracted. It was advanced over a Bentson wire into the left renal artery. Angiography was performed. A Renegade microcatheter was advanced  through the sauce Omni catheter and over a 016 fathom wire into the peripheral branches of the left renal artery towards the upper pole. Contrast was injected confirming position in in the peripheral left renal arterial tree. Coil embolization was performed. Coiled utilized were 6 mm x 10 cm interlock, 6 mm x 20 cm interlock, 6 mm x 20 cm interlock. Repeat angiography through the microcatheter was performed. This demonstrated some residual flow through the coil pack. Gel-Foam slurry was then injected through the microcatheter until stasis of flow was achieved. Microcatheter was removed. The soft was retracted into the right common iliac artery and right femoral angiography was performed. The catheter was removed. The sheath was flushed and sewn in place. Under sonographic guidance, a micropuncture needle was inserted into the left common femoral vein and removed over a 018 wire which was up sized to a 3 J. Sonographic documentation was obtained. The right common femoral vein was noted to be patent. A 7 French triple-lumen catheter was advanced over the 035 3 J wire. It was flushed and sewn in place. FINDINGS: Aortic arteriography demonstrates left renal artery anatomy. There is significant narrowing just beyond its takeoff. The right renal artery is not clearly visualized and most  likely has been ligated. Selective arteriography of the left renal artery delineates anatomy and demonstrates active extravasation involving the upper pole of the left kidney in multiple locations. Subsequent imaging demonstrates selective injection of third order branches of the left renal artery. Serial imaging demonstrates deposition of coils within the left upper pole renal artery branches extending into the main left renal artery. Final arteriography demonstrates occlusion of the left renal artery. Femoral arteriography delineates right femoral artery anatomy and sheath insertion. Final imaging demonstrates a right common femoral vein  triple-lumen central venous catheter with its tip in the right common iliac vein. IMPRESSION: Successful coil embolization of the main left renal artery with stasis of flow and resolution of active extravasation of bleeding from the upper pole of the left kidney. Successful right common femoral vein central venous triple-lumen catheter. Electronically Signed   By: Marybelle Killings M.D.   On: 01/13/2018 13:50   [place] Guide Roadmapping  Result Date: 01/13/2018 INDICATION: Hypovolemic shock.  Left perinephric hematoma. EXAM: IR RIGHT FLOURO GUIDE CV LINE; IR EMBO ART VEN HEMORR LYMPH EXTRAV INC GUIDE ROADMAPPING; IR RENAL SELECTIVE UNI INC S+I MODERATE SEDATION; IR ULTRASOUND GUIDANCE VASC ACCESS RIGHT MEDICATIONS: None. The antibiotic was administered within 1 hour of the procedure ANESTHESIA/SEDATION: Moderate (conscious) sedation was employed during this procedure. A total of Versed 0.5 mg and Fentanyl 0 mcg was administered intravenously. Moderate Sedation Time: 57 minutes. The patient's level of consciousness and vital signs were monitored continuously by radiology nursing throughout the procedure under my direct supervision. CONTRAST:  62mL OMNIPAQUE IOHEXOL 300 MG/ML  SOLN FLUOROSCOPY TIME:  Fluoroscopy Time: 12 minutes 30 seconds (620 mGy). COMPLICATIONS: None immediate. PROCEDURE: Informed consent was obtained from the patient following explanation of the procedure, risks, benefits and alternatives. The patient understands, agrees and consents for the procedure. All questions were addressed. A time out was performed prior to the initiation of the procedure. Maximal barrier sterile technique utilized including caps, mask, sterile gowns, sterile gloves, large sterile drape, hand hygiene, and Betadine prep. The right groin was prepped and draped in a sterile fashion. 1% lidocaine was utilized for local anesthesia. Under sonographic guidance, a micropuncture needle was inserted  into the right common femoral artery. Sonographic documentation was obtained and the common femoral artery was noted to be patent. A 1 8 wire was up sized to a Bentson. Five French sheath was inserted. A 5 French pigtail catheter was advanced into the abdominal aorta and AP arteriography was performed. The pigtail was exchanged for a five [place] sauce Omni catheter was advanced over the wire into the thoracic aorta. The reverse curve was formed. The catheter was retracted. It was advanced over a Bentson wire into the left renal artery. Angiography was performed. A Renegade microcatheter was advanced through the sauce Omni catheter and over a 016 fathom wire into the peripheral branches of the left renal artery towards the upper pole. Contrast was injected confirming position in in the peripheral left renal arterial tree. Coil embolization was performed. Coiled utilized were 6 mm x 10 cm interlock, 6 mm x 20 cm interlock, 6 mm x 20 cm interlock. Repeat angiography through the microcatheter was performed. This demonstrated some residual flow through the coil pack. Gel-Foam slurry was then injected through the microcatheter until stasis of flow was achieved. Microcatheter was removed. The soft was retracted into the right common iliac artery and right femoral angiography was performed. The catheter was removed. The  sheath was flushed and sewn in place. Under sonographic guidance, a micropuncture needle was inserted into the left common femoral vein and removed over a 018 wire which was up sized to a 3 J. Sonographic documentation was obtained. The right common femoral vein was noted to be patent. A 7 French triple-lumen catheter was advanced over the 035 3 J wire. It was flushed and sewn in place. FINDINGS: Aortic arteriography demonstrates left renal artery anatomy. There is significant narrowing just beyond its takeoff. The right renal artery is not clearly visualized and most likely has been ligated. Selective  arteriography of the left renal artery delineates anatomy and demonstrates active extravasation involving the upper pole of the left kidney in multiple locations. Subsequent imaging demonstrates selective injection of third order branches of the left renal artery. Serial imaging demonstrates deposition of coils within the left upper pole renal artery branches extending into the main left renal artery. Final arteriography demonstrates occlusion of the left renal artery. Femoral arteriography delineates right femoral artery anatomy and sheath insertion. Final imaging demonstrates a right common femoral vein triple-lumen central venous catheter with its tip in the right common iliac vein. IMPRESSION: Successful coil embolization of the main left renal artery with stasis of flow and resolution of active extravasation of bleeding from the upper pole of the left kidney. Successful right common femoral vein central venous triple-lumen catheter. Electronically Signed   By: Marybelle Killings M.D.   On: 01/13/2018 13:50     STUDIES:  CT abdomen pelvis 8/22: New 3.5 x 4.5 cm hypodensity in the left upper kidney with surrounding stranding and small central focus of high density material concerning for bleed underlying lesion not excluded.  Mild left hydroureteronephrosis.  Colonic diverticulosis without diverticulitis.  Mild left base atelectasis  CULTURES: none  ANTIBIOTICS: none  SIGNIFICANT EVENTS: 8/23 IR left renal artery embolization  AV Fistula left arm  LINES/TUBES: 8/23 - right femoral CVC   ASSESSMENT / PLAN:   Patient Active Problem List   Diagnosis Date Noted  . History of atrial flutter 01/13/2018  . Hemorrhage of left kidney 01/13/2018  . Acute respiratory failure with hypoxemia ([place]) 07/17/2017  . 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  . Acute on chronic combined systolic and diastolic CHF, NYHA class 4 ([place]) 07/17/2017  . Unresponsiveness 03/16/2017  . Hypotension 03/16/2017  . Paroxysmal atrial  flutter ([place]) 03/16/2017  . Chronic pain 03/16/2017  . Leg pain, bilateral 03/16/2017  . Bile leak, postoperative 03/15/2016  . Elevated troponin 03/15/2016  . Rib pain on right side   . Biliary dyskinesia 02/12/2016  . Atrial flutter with rapid ventricular response ([place]) 06/27/2015  . Diverticulitis 04/21/2015  . Acute diverticulitis 04/21/2015  . Diverticulitis of intestine without perforation or abscess without bleeding 04/21/2015  . Pulmonary edema 01/13/2015  . Right upper quadrant abdominal pain 01/13/2015  . Acute respiratory failure with hypoxia ([place]) 01/13/2015  . Diverticulitis large intestine w/o perforation or abscess w/o bleeding 12/18/2014  . Hepatic cyst 12/18/2014  . Aftercare following surgery of the circulatory system, [place] 06/22/2013  . ESRD (end stage renal disease) on dialysis ([place]) 11/28/2012  . Right sided weakness 10/05/2012  . History of nephrectomy 07/04/2012  . End-stage renal disease on hemodialysis ([place]) 06/04/2011  . Systolic CHF, chronic ([place]) 05/19/2011    Class: Acute  . Hypertension, benign 05/19/2011    Class: Chronic  . DM II (diabetes mellitus, type II), controlled ([place]) 05/19/2011    Class: Chronic  . Gout, chronic 05/19/2011  Hemorrhagic shock in setting of left renal hemorrhage (multiple sites bleeding, required embolization of entire renal blood flow to the left kidney emergently on 8/23) exacerbated by Coumadin induced coagulopathy Cannot exclude underlying malignancy Status post 2 units PRBC as well as Kcentra on 8/23 Serial CBC and coags Norepinephrine for mean arterial pressure greater than 60 or systolic greater than 90 Nephro consulted, plan for gentle HD today  Holding warfarin PRN analgesia  Anemia of chronic disease.  Baseline hemoglobin 11.9.  Actually presented at 13.6.  Blood down to less than 10. See above, holding anticoagulation. 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  K+ 7.4. EDW 113.5.     Calc gluconate given with f/u EKG ordered Insulin/D5W - d510% for hypoglycemia  albuterol, sodium bicarb amp  Kayexalate bid  nephro plans for HD today if BP stable  Diabetes type 2 Plan  Sliding scale insulin Hold oral agents   DVT: SCDs  FAMILY  - Updates:   - Inter-disciplinary family meet or Palliative Care meeting due by:  Tonette Bihari, DO 01/14/2018, 7:48 AM Pager: [phone]

## 2018-01-15 ENCOUNTER — Other Ambulatory Visit: Payer: Self-pay

## 2018-01-15 DIAGNOSIS — I482 Chronic atrial fibrillation: Secondary | ICD-10-CM

## 2018-01-15 LAB — CBC WITH DIFFERENTIAL/PLATELET
Abs Immature Granulocytes: 0.2 10*3/uL — ABNORMAL HIGH (ref 0.0–0.1)
Basophils Absolute: 0 10*3/uL (ref 0.0–0.1)
Basophils Relative: 0 %
Eosinophils Absolute: 0 10*3/uL (ref 0.0–0.7)
Eosinophils Relative: 0 %
HCT: 24.4 % — ABNORMAL LOW (ref 39.0–52.0)
Hemoglobin: 8 g/dL — ABNORMAL LOW (ref 13.0–17.0)
Immature Granulocytes: 1 %
Lymphocytes Relative: 6 %
Lymphs Abs: 1.2 10*3/uL (ref 0.7–4.0)
MCH: 32 pg (ref 26.0–34.0)
MCHC: 32.8 g/dL (ref 30.0–36.0)
MCV: 97.6 fL (ref 78.0–100.0)
Monocytes Absolute: 2.1 10*3/uL — ABNORMAL HIGH (ref 0.1–1.0)
Monocytes Relative: 10 %
Neutro Abs: 17.5 10*3/uL — ABNORMAL HIGH (ref 1.7–7.7)
Neutrophils Relative %: 83 %
Platelets: 217 10*3/uL (ref 150–400)
RBC: 2.5 MIL/uL — ABNORMAL LOW (ref 4.22–5.81)
RDW: 16.1 % — ABNORMAL HIGH (ref 11.5–15.5)
WBC: 21 10*3/uL — ABNORMAL HIGH (ref 4.0–10.5)

## 2018-01-15 LAB — HEMOGLOBIN AND HEMATOCRIT, BLOOD
HCT: 24.2 % — ABNORMAL LOW (ref 39.0–52.0)
Hemoglobin: 7.8 g/dL — ABNORMAL LOW (ref 13.0–17.0)

## 2018-01-15 LAB — COMPREHENSIVE METABOLIC PANEL
ALT: 25 U/L (ref 0–44)
AST: 20 U/L (ref 15–41)
Albumin: 2.4 g/dL — ABNORMAL LOW (ref 3.5–5.0)
Alkaline Phosphatase: 70 U/L (ref 38–126)
Anion gap: 13 (ref 5–15)
BUN: 34 mg/dL — ABNORMAL HIGH (ref 6–20)
CO2: 28 mmol/L (ref 22–32)
Calcium: 8.3 mg/dL — ABNORMAL LOW (ref 8.9–10.3)
Chloride: 94 mmol/L — ABNORMAL LOW (ref 98–111)
Creatinine, Ser: 10.03 mg/dL — ABNORMAL HIGH (ref 0.61–1.24)
GFR calc Af Amer: 6 mL/min — ABNORMAL LOW (ref >60)
GFR calc non Af Amer: 5 mL/min — ABNORMAL LOW (ref >60)
Glucose, Bld: 112 mg/dL — ABNORMAL HIGH (ref 70–99)
Potassium: 5.3 mmol/L — ABNORMAL HIGH (ref 3.5–5.1)
Sodium: 135 mmol/L (ref 135–145)
Total Bilirubin: 1.1 mg/dL (ref 0.3–1.2)
Total Protein: 6.3 g/dL — ABNORMAL LOW (ref 6.5–8.1)

## 2018-01-15 LAB — GLUCOSE, CAPILLARY
Glucose-Capillary: 106 mg/dL — ABNORMAL HIGH (ref 70–99)
Glucose-Capillary: 88 mg/dL (ref 70–99)
Glucose-Capillary: 104 mg/dL — ABNORMAL HIGH (ref 70–99)
Glucose-Capillary: 61 mg/dL — ABNORMAL LOW (ref 70–99)
Glucose-Capillary: 65 mg/dL — ABNORMAL LOW (ref 70–99)
Glucose-Capillary: 66 mg/dL — ABNORMAL LOW (ref 70–99)
Glucose-Capillary: 37 mg/dL — CL (ref 70–99)
Glucose-Capillary: 49 mg/dL — ABNORMAL LOW (ref 70–99)
Glucose-Capillary: 67 mg/dL — ABNORMAL LOW (ref 70–99)

## 2018-01-15 LAB — PROTIME-INR
INR: 1.35
Prothrombin Time: 16.5 seconds — ABNORMAL HIGH (ref 11.4–15.2)

## 2018-01-15 LAB — BASIC METABOLIC PANEL
Anion gap: 15 (ref 5–15)
BUN: 43 mg/dL — ABNORMAL HIGH (ref 6–20)
CO2: 25 mmol/L (ref 22–32)
Calcium: 8.6 mg/dL — ABNORMAL LOW (ref 8.9–10.3)
Chloride: 92 mmol/L — ABNORMAL LOW (ref 98–111)
Creatinine, Ser: 11.35 mg/dL — ABNORMAL HIGH (ref 0.61–1.24)
GFR calc Af Amer: 5 mL/min — ABNORMAL LOW (ref >60)
GFR calc non Af Amer: 4 mL/min — ABNORMAL LOW (ref >60)
Glucose, Bld: 78 mg/dL (ref 70–99)
Potassium: 5.5 mmol/L — ABNORMAL HIGH (ref 3.5–5.1)
Sodium: 132 mmol/L — ABNORMAL LOW (ref 135–145)

## 2018-01-15 LAB — PHOSPHORUS: Phosphorus: 5.8 mg/dL — ABNORMAL HIGH (ref 2.5–4.6)

## 2018-01-15 LAB — MAGNESIUM: Magnesium: 2.1 mg/dL (ref 1.7–2.4)

## 2018-01-15 LAB — PROCALCITONIN: Procalcitonin: 18.71 ng/mL

## 2018-01-15 LAB — HEPATITIS B SURFACE ANTIGEN: Hepatitis B Surface Ag: NEGATIVE

## 2018-01-15 MED ORDER — NOREPINEPHRINE 16 MG/250ML-% IV SOLN
0.0000 ug/min | INTRAVENOUS | Status: DC
  Administered 2018-01-15: 18 ug/min via INTRAVENOUS
  Filled 2018-01-15: qty 250

## 2018-01-15 MED ORDER — CHLORHEXIDINE GLUCONATE CLOTH 2 % EX PADS: 6.0000 | MEDICATED_PAD | Freq: Every day | CUTANEOUS | Status: DC

## 2018-01-15 MED ORDER — SODIUM CHLORIDE 0.9 % IV SOLN
INTRAVENOUS | Status: DC | PRN
  Administered 2018-01-15: 250 mL via INTRAVENOUS
  Administered 2018-01-20 – 2018-01-21 (×2): 100 mL via INTRAVENOUS
  Administered 2018-01-22: 250 mL via INTRAVENOUS

## 2018-01-15 MED ORDER — DEXTROSE 50 % IV SOLN
INTRAVENOUS | Status: AC
  Filled 2018-01-15: qty 50

## 2018-01-15 MED ORDER — SODIUM CHLORIDE 0.9 % IV SOLN
2.0000 g | Freq: Once | INTRAVENOUS | Status: AC
  Administered 2018-01-15: 2 g via INTRAVENOUS
  Filled 2018-01-15: qty 2

## 2018-01-15 MED ORDER — DEXTROSE-NACL 5-0.9 % IV SOLN
INTRAVENOUS | Status: DC
  Administered 2018-01-15: 09:00:00 via INTRAVENOUS

## 2018-01-15 MED ORDER — SODIUM CHLORIDE 0.9 % IV SOLN
1.0000 g | INTRAVENOUS | Status: AC
  Administered 2018-01-17 – 2018-01-22 (×7): 1 g via INTRAVENOUS
  Filled 2018-01-15 (×8): qty 1

## 2018-01-15 NOTE — Progress Notes (Signed)
Pharmacy Antibiotic Note  [name] is a 57 y.o. male admitted on 01/12/2018 with hemorrhagic shock 2/2 L renal hemorrhage. Patient with leukocytosis up to 21 and CXR with LLL consolidation. Pharmacy has been consulted for cefepime dosing. PMH s/f ESRD on HD, usually MWF. Last HD session 8/24.  Plan: Cefepime 2g IV x1, then 1g IV q24h F/u HD schedule and adjust dose as appropriate F/u clinical status, de-escalation, LOT  Height: 6\' 1"  (185.4 cm) Weight: 251 lb 1.7 oz (113.9 kg) IBW/kg (Calculated) : 79.9  Temp (24hrs), Avg:98.3 F (36.8 C), Min:97.7 F (36.5 C), Max:99.2 F (37.3 C)  Recent Labs  Lab 01/13/18 0247  01/13/18 0830 01/13/18 1047 01/13/18 1154 01/13/18 2022 01/14/18 0431 01/14/18 0607 01/14/18 0943 01/15/18 0400  WBC  --    < > 11.0*  --  22.0* 17.6*  --  20.7*  --  21.0*  CREATININE 14.00*  --   --  13.96*  --   --  14.46*  --  14.83* 10.03*   < > = values in this interval not displayed.    Estimated Creatinine Clearance: 11.1 mL/min (A) (by C-G formula based on SCr of 10.03 mg/dL (H)).    Allergies  Allergen Reactions  . Ace Inhibitors Itching and Cough   Antimicrobials this admission: Cefepime 8/25 >>   Microbiology results: 8/24 BCx: pending 8/23 MRSA PCR: negative  Thank you for allowing pharmacy to be a part of this patient's care.  Mila Merry Gerarda Fraction, PharmD PGY2 Infectious Diseases Pharmacy Resident Phone: [phone] 01/15/2018 9:33 AM

## 2018-01-15 NOTE — Progress Notes (Addendum)
PULMONARY / CRITICAL CARE MEDICINE   Name: [name] MRN: [id number] DOB: [birth year]    ADMISSION DATE:  01/12/2018 CONSULTATION DATE:  01/13/18  REFERRING MD:  Erlinda Hong  CHIEF COMPLAINT:  Hemorrhagic Shock   HISTORY OF PRESENT ILLNESS:   This is a 57 year old aam w/ sig h/o ESRD (MWF HD), prior right nephrectomy, a flutter (on amio and warfarin), CHF and chronic hypotension (on midodrine). Usually anuric but went to the BR on 8/22 and voided BRB. DX eval in ER: large renal hematoma 3.5x5cm w/ mild hydro. Hgb 13.6 and INR was 4.59  on arrival. Admitted w/ left renal hemorrhage w/ significant associated flank pain. Received gentle IVFs, analgesia and Kcentra.  -8/23 While in IR undergoing embolization became progressively hypotensive and tachycardic. PCCM asked to consult emergently. Getting PRBCs x 2 w/ excellent hemodynamic response  PAST MEDICAL HISTORY :  He  has a past medical history of Atrial flutter ([place]), Blind right eye, Cardiomyopathy, CHF (congestive heart failure) ([place]), Coronary artery disease, Diabetes mellitus, Dialysis patient [facility]), Diverticulitis (November 2016), ESRD (end stage renal disease) ([place]), GERD (gastroesophageal reflux disease), Gout, History of nephrectomy (07/04/2012), History of unilateral nephrectomy, Hypertension, Low iron, Myocardial infarction ([place]) (?2006), Renal cell carcinoma, Renal insufficiency, Shortness of breath (05/19/11), Stroke ([place]) (16/9678), and Umbilical hernia (93/81/01).  PAST SURGICAL HISTORY: He  has a past surgical history that includes Nephrectomy (2000); AV fistula placement (03/29/2011); smashed (1990's); US ECHOCARDIOGRAPHY (05/20/11); Finger surgery; Umbilical hernia repair (N/A, 07/04/2012); Insertion of mesh (N/A, 07/04/2012); Hernia repair (N/A, 07/04/2012); dialysis cath placed; Revison of arteriovenous fistula (Left, 06/05/2013); right heart catheterization (N/A, 05/21/2011); left and right heart catheterization with coronary angiogram (N/A,  08/04/2012); Esophagogastroduodenoscopy (egd) with propofol (N/A, 12/02/2015); Revison of arteriovenous fistula (Left, 01/27/2016); Insertion of dialysis catheter (N/A, 01/27/2016); Cholecystectomy (N/A, 02/12/2016); Laparoscopic lysis of adhesions (N/A, 02/12/2016); Cardiac catheterization (05/21/11); Cardiac catheterization (N/A, 03/16/2016); Revison of arteriovenous fistula (Left, 08/03/2016); Colonoscopy with propofol (N/A, 02/01/2017); Revison of arteriovenous fistula (Left, 05/06/2017); IR Fluoro Guide CV Line Right (01/13/2018); IR EMBO ART  VEN HEMORR LYMPH EXTRAV  INC GUIDE ROADMAPPING (01/13/2018); IR Angiogram Renal Left Selective (01/13/2018); and IR US Guide Vasc Access Right (01/13/2018).  Allergies  Allergen Reactions  . Ace Inhibitors Itching and Cough    No current facility-administered medications on file prior to encounter.    Current Outpatient Medications on File Prior to Encounter  Medication Sig  . allopurinol (ZYLOPRIM) 300 MG tablet Take 150 mg by mouth every other day.  Marland Kitchen amiodarone (PACERONE) 200 MG tablet Take 0.5 tablets (100 mg total) by mouth daily.  . ferric citrate (AURYXIA) 1 GM 210 MG(Fe) tablet Take 210 mg by mouth 3 (three) times daily with meals.   . lidocaine-prilocaine (EMLA) cream Apply 1 application topically See admin instructions. Apply topically to port access prior to dialysis - Monday, Wednesday, Friday  . midodrine (PROAMATINE) 10 MG tablet Take 0.5 tablets (5 mg total) by mouth every Monday, Wednesday, and Friday with hemodialysis. Take on the morning of dialysis days (Patient taking differently: Take 5-10 mg by mouth See admin instructions. Take one tablet (10 mg) by mouth before dialysis (Monday, Wednesday, Friday), take 1/2 tablet (5 mg) on non-dialysis days (Sunday, Tuesday, Thursday, Saturday) as needed for diastolic blood pressure <75.)  . multivitamin (RENA-VIT) TABS tablet Take 1 tablet by mouth at bedtime.  . nitroGLYCERIN (NITROSTAT) 0.4 MG SL tablet  Place 0.4 mg under the tongue every 5 (five) minutes as needed for chest pain.   Marland Kitchen  pantoprazole (PROTONIX) 40 MG tablet Take 40 mg by mouth 2 (two) times daily.  Marland Kitchen warfarin (COUMADIN) 10 MG tablet Take 15 mg by mouth daily at 6 PM.    FAMILY HISTORY:  His family history includes Hypertension in his mother; Kidney disease in his mother.  SOCIAL HISTORY: He  reports that he has never smoked. He has never used smokeless tobacco. He reports that he does not drink alcohol or use drugs.  SUBJECTIVE:  Continued pain even with increased pain medication. BM yesterday. No hematuria o/n. +nausea, denies vomiting since yesterday.   VITAL SIGNS: BP 118/67   Pulse 97   Temp 99.2 F (37.3 C) (Oral)   Resp 20   Ht 6\' 1"  (1.854 m)   Wt 113.9 kg   SpO2 93%   BMI 33.13 kg/m    VENTILATOR SETTINGS:  On room air  INTAKE / OUTPUT: I/O last 3 completed shifts: In: 2186.1 [P.O.:240; I.V.:1836.2; IV Piggyback:110] Out: -443 [Urine:100]  PHYSICAL EXAMINATION: [place]: supine in bed, mild distress, obese HEENT: [place]/AT, no scleral icterus Cardio: tachycardic, regular rhythm, no m/r/g Respiratory: CTAB, no wheezing rales rhonchi Abdominal: hypoactive BS, diffusely TTP, voluntary guarding, non-distended, soft, +rebound tenderness MSK: + pedal pulses, no edema Neuro: A&Ox3, cooperative Skin: c/d/i   LABS:  BMET Recent Labs  Lab 01/14/18 0431 01/14/18 0943 01/15/18 0400  NA 132* 132* 135  K 7.4* 5.6* 5.3*  CL 92* 92* 94*  CO2 26 24 28   BUN 63* 66* 34*  CREATININE 14.46* 14.83* 10.03*  GLUCOSE 106* 121* 112*    Electrolytes Recent Labs  Lab 01/13/18 1047 01/14/18 0431 01/14/18 0943 01/15/18 0400  CALCIUM 8.3* 8.2* 8.5* 8.3*  MG  --  2.5*  --  2.1  PHOS 7.3* 7.2*  --  5.8*    CBC Recent Labs  Lab 01/13/18 2022 01/14/18 0607 01/15/18 0400  WBC 17.6* 20.7* 21.0*  HGB 11.5* 10.3* 8.0*  HCT 35.3* 31.3* 24.4*  PLT 258 255 217    Coag's Recent Labs  Lab  01/13/18 1154 01/14/18 0431 01/15/18 0400  INR 1.18 1.23 1.35    Sepsis Markers Recent Labs  Lab 01/14/18 0943 01/15/18 0400  PROCALCITON 17.92 18.71    ABG No results for input(s): PHART, PCO2ART, PO2ART in the last 168 hours.  Liver Enzymes Recent Labs  Lab 01/12/18 2159 01/13/18 1047 01/15/18 0400  AST 10*  --  20  ALT 8  --  25  ALKPHOS 68  --  70  BILITOT 0.9  --  1.1  ALBUMIN 3.1* 2.6* 2.4*    Cardiac Enzymes Recent Labs  Lab 01/13/18 0526 01/13/18 1156 01/13/18 2022  TROPONINI 0.27* 0.21* 0.20*    Glucose Recent Labs  Lab 01/14/18 0801 01/14/18 1029 01/14/18 1626 01/14/18 1945 01/14/18 2354 01/15/18 0356  GLUCAP 71 81 96 77 93 106*    Imaging Dg Chest Port 1 View  Result Date: 01/14/2018 CLINICAL DATA:  Congestive heart failure EXAM: PORTABLE CHEST 1 VIEW COMPARISON:  07/17/2017 FINDINGS: Chronic cardiomegaly. Abnormal density in the left lower chest probably secondary to a combination of pleural fluid and left lower lobe density. This could be simple atelectasis or pneumonia. The right chest is clear. No sign of pulmonary edema. IMPRESSION: Cardiomegaly. Left effusion. Abnormal left base pulmonary density which could be atelectasis and/or pneumonia. Electronically Signed   By: Nelson Chimes M.D.   On: 01/14/2018 13:00    . dextrose 20 mL/hr at 01/15/18 0500  . norepinephrine (LEVOPHED) Adult infusion Stopped (  01/15/18 6314)     STUDIES:  CT abdomen pelvis 8/22: New 3.5 x 4.5 cm hypodensity in the left upper kidney with surrounding stranding and small central focus of high density material concerning for bleed underlying lesion not excluded.  Mild left hydroureteronephrosis.  Colonic diverticulosis without diverticulitis.  Mild left base atelectasis  CULTURES: Blood cx. 8/24:   ANTIBIOTICS: none  SIGNIFICANT EVENTS: 8/23 IR left renal artery embolization  AV Fistula left arm  LINES/TUBES: 8/23 - right femoral CVC   ASSESSMENT /  PLAN:   Patient Active Problem List   Diagnosis Date Noted  . History of atrial flutter 01/13/2018  . Hemorrhage of left kidney 01/13/2018  . Acute respiratory failure with hypoxemia ([place]) 07/17/2017  . 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  . Acute on chronic combined systolic and diastolic CHF, NYHA class 4 ([place]) 07/17/2017  . Unresponsiveness 03/16/2017  . Hypotension 03/16/2017  . Paroxysmal atrial flutter ([place]) 03/16/2017  . Chronic pain 03/16/2017  . Leg pain, bilateral 03/16/2017  . Bile leak, postoperative 03/15/2016  . Elevated troponin 03/15/2016  . Rib pain on right side   . Biliary dyskinesia 02/12/2016  . Atrial flutter with rapid ventricular response ([place]) 06/27/2015  . Diverticulitis 04/21/2015  . Acute diverticulitis 04/21/2015  . Diverticulitis of intestine without perforation or abscess without bleeding 04/21/2015  . Pulmonary edema 01/13/2015  . Right upper quadrant abdominal pain 01/13/2015  . Acute respiratory failure with hypoxia ([place]) 01/13/2015  . Diverticulitis large intestine w/o perforation or abscess w/o bleeding 12/18/2014  . Hepatic cyst 12/18/2014  . Aftercare following surgery of the circulatory system, [place] 06/22/2013  . ESRD (end stage renal disease) on dialysis ([place]) 11/28/2012  . Right sided weakness 10/05/2012  . History of nephrectomy 07/04/2012  . End-stage renal disease on hemodialysis ([place]) 06/04/2011  . Systolic CHF, chronic ([place]) 05/19/2011    Class: Acute  . Hypertension, benign 05/19/2011    Class: Chronic  . DM II (diabetes mellitus, type II), controlled ([place]) 05/19/2011    Class: Chronic  . Gout, chronic 05/19/2011    Hemorrhagic shock in setting of left renal hemorrhage (multiple sites bleeding, required embolization of entire renal blood flow to the left kidney emergently on 8/23) exacerbated by Coumadin induced coagulopathy Cannot exclude underlying malignancy. Status post 2 units PRBC as well as Kcentra on 8/23. HD yesterday. Cont. To  have abdominal pain and increasing leukocytosis.    Serial CBC and coags Norepi for SBP of 90 or MAP of 60 Hold anti-coagulation PRN pain control oxy & hydrocodone  Anemia of chronic disease.  Baseline hemoglobin 11.9.  Actually presented at 13.6.  Blood down to less than 10. Hb 8 today.   See above, holding anticoagulation. CBC and transfuse per ICU protocol  Leukocytosis s/p IR embolization for left renal hemorrhage: procedural vs. Infection. CXR with consolidation LLL.  Trend CBC Cefepime per pharm  Blood cx pending   History of atrial fibrillation. Holding anticoagulation given life-threatening bleed. Will need to discuss when to restart anti-coagulation for a-fib, hold for now.   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  K+ 7.4. EDW 113.5.  Improved with HD  Insulin/D5NS % for hypoglycemia & hyponatremia Albuterol  Diabetes type 2 Plan  Sliding scale insulin Hold oral agents D5NS 20cc/hr  DVT: SCDs IVF: D5NS 20cc/hr VTE: SCDs  FAMILY  - Updates: Patient updated bedside.  Dispo: Transfer to step down   [name], DO 01/15/2018, 8:38 AM Pager: [phone]

## 2018-01-15 NOTE — Progress Notes (Addendum)
[facility] Progress Note  Subjective: no new c/o.  Abd pain is getting better and BP's are coming up, improving. Hb down to 8.0 today. K+ 5.3.  Had HD yest but was not full Rx due to clotting the system twice.               Vitals:   01/15/18 1400 01/15/18 1430 01/15/18 1500 01/15/18 1530  BP: (!) 108/49 119/61 (!) 124/59 (!) 135/59  Pulse: 90 92 90 90  Resp: 18 18 16 18   Temp:      TempSrc:      SpO2: 90% 93% 93% 94%  Weight:      Height:        Inpatient medications: . allopurinol  150 mg Oral QODAY  . amiodarone  100 mg Oral Daily  . Chlorhexidine Gluconate Cloth  6 each Topical Daily  . Chlorhexidine Gluconate Cloth  6 each Topical Q0600  . [START ON 01/16/2018] doxercalciferol  2 mcg Intravenous Q M,W,F-HD  . ferric citrate  210 mg Oral TID WC  . midodrine  10 mg Oral TID WC  . multivitamin  1 tablet Oral QHS  . pantoprazole  40 mg Oral BID  . sodium chloride flush  10-40 mL Intracatheter Q12H   . sodium chloride 250 mL (01/15/18 1102)  . [START ON 01/16/2018] ceFEPime (MAXIPIME) IV    . dextrose 5 % and 0.9% NaCl 20 mL/hr at 01/15/18 1000  . norepinephrine (LEVOPHED) Adult infusion Stopped (01/15/18 0647)   sodium chloride, acetaminophen **OR** acetaminophen, HYDROcodone-acetaminophen, ondansetron **OR** ondansetron (ZOFRAN) IV, oxyCODONE, sodium chloride flush  Iron/TIBC/Ferritin/ %Sat    Component Value Date/Time   IRON 40 (L) 05/22/2011 0600   TIBC 224 05/22/2011 0600   FERRITIN 422 (H) 05/22/2011 0600   IRONPCTSAT 18 (L) 05/22/2011 0600    Exam: General: WDWN male lying bed NAD  Head: NCAT sclera not icteric MMM Neck: Supple. No JVD No masses Lungs: CTA bilaterally without wheezes, rales, or rhonchi. Breathing is unlabored. Heart: RRR with S1 S2 Abdomen: soft NT + BS Lower extremities:without edema or ischemic changes, no open wounds  Neuro: A & O  X 3. Moves all extremities spontaneously. Psych:  Responds to questions appropriately with  a normal affect. Dialysis Access: LUE AVF +bruit    Dialysis:  [facility]  MWF  4h  111.5kg  2/2.25 bath  P4  LUE AVF  Hep 7000 +3000 midrun Hectorol 72mcg IV TIW Parsabiv 12.5mg  IV TIW  Assessment: 1. L renal hemorrhage - s/p L renal artery embolization by IR 8/23. Abd pain improving. Hb down some today to 8.0.   2. Hypotension - much better, off of levophed now. BP's up. Gets midodrine at home 10 mg pre HD on MWF and also 5mg  prn on nonHD days if SBP < 85. Here pt is getting 1mg  tid, this may need to be reduced if BP's stay up this high.  3. Hyperkalemia - better but not resolved.  4. ESRD - MWF HD. HD tomorrow. Spoke w/ urology , recommended continue holding heparin until more stable (Hb and BP's).  5. Anemia of CKD/ abl - is sp 2 units prbc's on 8/23, Hb 8.0 today.  6. Metabolic bone disease -  Ca ok. Phos elevated. Resume binders when eating. Continue Hectorol.  7. Aflutter - on amiodarone. Coumadin on hold.  8. Hx RCC s/p right nephrectomy 9. NICM EF 30-35%  10. DM 2 stable  Plan - as above   Kelly Splinter MD  [facility] pager [phone]   01/15/2018, 3:49 PM   Recent Labs  Lab 01/13/18 1047  01/14/18 0431 01/14/18 0943 01/15/18 0400  NA 134*  --  132* 132* 135  K 5.9*  --  7.4* 5.6* 5.3*  CL 93*  --  92* 92* 94*  CO2 21*  --  26 24 28   GLUCOSE 228*  --  106* 121* 112*  BUN 49*  --  63* 66* 34*  CREATININE 13.96*  --  14.46* 14.83* 10.03*  CALCIUM 8.3*  --  8.2* 8.5* 8.3*  PHOS 7.3*  --  7.2*  --  5.8*  ALBUMIN 2.6*  --   --   --  2.4*  INR  --    < > 1.23  --  1.35   < > = values in this interval not displayed.   Recent Labs  Lab 01/12/18 2159 01/15/18 0400  AST 10* 20  ALT 8 25  ALKPHOS 68 70  BILITOT 0.9 1.1  PROT 7.8 6.3*   Recent Labs  Lab 01/14/18 0607 01/15/18 0400  WBC 20.7* 21.0*  NEUTROABS  --  17.5*  HGB 10.3* 8.0*  HCT 31.3* 24.4*  MCV 96.0 97.6  PLT 255 217

## 2018-01-15 NOTE — Plan of Care (Signed)
Pt states his pain has improved since adding additional PRN pain med.

## 2018-01-15 NOTE — Progress Notes (Signed)
  Subjective: Patient reports feeling better but still has abd/flank pain on left. Gross hematuria resolved. No further episode. He's had a BM.   Objective: Vital signs in last 24 hours: Temp:  [97.7 F (36.5 C)-99.2 F (37.3 C)] 98.8 F (37.1 C) (08/25 0716) Pulse Rate:  [90-120] 90 (08/25 1530) Resp:  [16-32] 18 (08/25 1530) BP: (51-163)/(22-89) 135/59 (08/25 1530) SpO2:  [90 %-100 %] 94 % (08/25 1530) Weight:  [113.9 kg] 113.9 kg (08/24 1747)  Intake/Output from previous day: 08/24 0701 - 08/25 0700 In: 1742.6 [P.O.:240; I.V.:1392.6; IV Piggyback:110] Out: -443 [Urine:100] Intake/Output this shift: Total I/O In: 258.5 [I.V.:158.5; IV Piggyback:100] Out: -   Physical Exam:  NAD Looks well, alert and oriented  Abd - soft, NT   Lab Results: Recent Labs    01/13/18 2022 01/14/18 0607 01/15/18 0400  HGB 11.5* 10.3* 8.0*  HCT 35.3* 31.3* 24.4*   BMET Recent Labs    01/14/18 0943 01/15/18 0400  NA 132* 135  K 5.6* 5.3*  CL 92* 94*  CO2 24 28  GLUCOSE 121* 112*  BUN 66* 34*  CREATININE 14.83* 10.03*  CALCIUM 8.5* 8.3*   Recent Labs    01/13/18 1154 01/14/18 0431 01/15/18 0400  INR 1.18 1.23 1.35   No results for input(s): LABURIN in the last 72 hours. Results for orders placed or performed during the hospital encounter of 01/12/18  MRSA PCR Screening     Status: None   Collection Time: 01/13/18 11:56 AM  Result Value Ref Range Status   MRSA by PCR NEGATIVE NEGATIVE Final    Comment:        The GeneXpert MRSA Assay (FDA approved for NASAL specimens only), is one component of a comprehensive MRSA colonization surveillance program. It is not intended to diagnose MRSA infection nor to guide or monitor treatment for MRSA infections. Performed at [facility], [place] [address] [place], [place] [address]     Studies/Results: Dg Chest Port 1 View  Result Date: 01/14/2018 CLINICAL DATA:  Congestive heart failure EXAM: PORTABLE CHEST 1 VIEW  COMPARISON:  07/17/2017 FINDINGS: Chronic cardiomegaly. Abnormal density in the left lower chest probably secondary to a combination of pleural fluid and left lower lobe density. This could be simple atelectasis or pneumonia. The right chest is clear. No sign of pulmonary edema. IMPRESSION: Cardiomegaly. Left effusion. Abnormal left base pulmonary density which could be atelectasis and/or pneumonia. Electronically Signed   By: Nelson Chimes M.D.   On: 01/14/2018 13:00    Impression/Assessment: Spontaneous left renal bleed ? Source and abnormal INR; risk factors for underlying malignancy and will need re-imaging months later. S/p embolization with IR 8/23 -   Plan: -serial H/H - may need rescan if hgb drops. Discussed with Dr. Nelda Marseille. Hgb dropped but pt pressors were weaned off and he's remained stable. Serial H/H.   Will follow.     LOS: 2 days   [name] 01/15/2018, 3:37 PM

## 2018-01-15 NOTE — Progress Notes (Signed)
PULMONARY / CRITICAL CARE MEDICINE   Name: [name] MRN: [id number] DOB: [birth year]    ADMISSION DATE:  01/12/2018 CONSULTATION DATE:  01/13/18  REFERRING MD:  Erlinda Hong  CHIEF COMPLAINT:  Hemorrhagic Shock   HISTORY OF PRESENT ILLNESS:   This is a 57 year old aam w/ sig h/o ESRD (MWF HD), prior right nephrectomy, a flutter (on amio and warfarin), CHF and chronic hypotension (on midodrine). Usually anuric but went to the BR on 8/22 and voided BRB. DX eval in ER: large renal hematoma 3.5x5cm w/ mild hydro. Hgb 13.6 and INR was 4.59  on arrival. Admitted w/ left renal hemorrhage w/ significant associated flank pain. Received gentle IVFs, analgesia and Kcentra.  -8/23 While in IR undergoing embolization became progressively hypotensive and tachycardic. PCCM asked to consult emergently. Getting PRBCs x 2 w/ excellent hemodynamic response  PAST MEDICAL HISTORY :  He  has a past medical history of Atrial flutter ([place]), Blind right eye, Cardiomyopathy, CHF (congestive heart failure) ([place]), Coronary artery disease, Diabetes mellitus, Dialysis patient [facility]), Diverticulitis (November 2016), ESRD (end stage renal disease) ([place]), GERD (gastroesophageal reflux disease), Gout, History of nephrectomy (07/04/2012), History of unilateral nephrectomy, Hypertension, Low iron, Myocardial infarction ([place]) (?2006), Renal cell carcinoma, Renal insufficiency, Shortness of breath (05/19/11), Stroke ([place]) (57/3220), and Umbilical hernia (25/42/70).  PAST SURGICAL HISTORY: He  has a past surgical history that includes Nephrectomy (2000); AV fistula placement (03/29/2011); smashed (1990's); US ECHOCARDIOGRAPHY (05/20/11); Finger surgery; Umbilical hernia repair (N/A, 07/04/2012); Insertion of mesh (N/A, 07/04/2012); Hernia repair (N/A, 07/04/2012); dialysis cath placed; Revison of arteriovenous fistula (Left, 06/05/2013); right heart catheterization (N/A, 05/21/2011); left and right heart catheterization with coronary angiogram (N/A,  08/04/2012); Esophagogastroduodenoscopy (egd) with propofol (N/A, 12/02/2015); Revison of arteriovenous fistula (Left, 01/27/2016); Insertion of dialysis catheter (N/A, 01/27/2016); Cholecystectomy (N/A, 02/12/2016); Laparoscopic lysis of adhesions (N/A, 02/12/2016); Cardiac catheterization (05/21/11); Cardiac catheterization (N/A, 03/16/2016); Revison of arteriovenous fistula (Left, 08/03/2016); Colonoscopy with propofol (N/A, 02/01/2017); Revison of arteriovenous fistula (Left, 05/06/2017); IR Fluoro Guide CV Line Right (01/13/2018); IR EMBO ART  VEN HEMORR LYMPH EXTRAV  INC GUIDE ROADMAPPING (01/13/2018); IR Angiogram Renal Left Selective (01/13/2018); and IR US Guide Vasc Access Right (01/13/2018).  Allergies  Allergen Reactions  . Ace Inhibitors Itching and Cough    No current facility-administered medications on file prior to encounter.    Current Outpatient Medications on File Prior to Encounter  Medication Sig  . allopurinol (ZYLOPRIM) 300 MG tablet Take 150 mg by mouth every other day.  Marland Kitchen amiodarone (PACERONE) 200 MG tablet Take 0.5 tablets (100 mg total) by mouth daily.  . ferric citrate (AURYXIA) 1 GM 210 MG(Fe) tablet Take 210 mg by mouth 3 (three) times daily with meals.   . lidocaine-prilocaine (EMLA) cream Apply 1 application topically See admin instructions. Apply topically to port access prior to dialysis - Monday, Wednesday, Friday  . midodrine (PROAMATINE) 10 MG tablet Take 0.5 tablets (5 mg total) by mouth every Monday, Wednesday, and Friday with hemodialysis. Take on the morning of dialysis days (Patient taking differently: Take 5-10 mg by mouth See admin instructions. Take one tablet (10 mg) by mouth before dialysis (Monday, Wednesday, Friday), take 1/2 tablet (5 mg) on non-dialysis days (Sunday, Tuesday, Thursday, Saturday) as needed for diastolic blood pressure <62.)  . multivitamin (RENA-VIT) TABS tablet Take 1 tablet by mouth at bedtime.  . nitroGLYCERIN (NITROSTAT) 0.4 MG SL tablet  Place 0.4 mg under the tongue every 5 (five) minutes as needed for chest pain.   Marland Kitchen  pantoprazole (PROTONIX) 40 MG tablet Take 40 mg by mouth 2 (two) times daily.  Marland Kitchen warfarin (COUMADIN) 10 MG tablet Take 15 mg by mouth daily at 6 PM.    FAMILY HISTORY:  His family history includes Hypertension in his mother; Kidney disease in his mother.  SOCIAL HISTORY: He  reports that he has never smoked. He has never used smokeless tobacco. He reports that he does not drink alcohol or use drugs.  SUBJECTIVE:  Continued pain even with increased pain medication. BM yesterday. No hematuria o/n. +nausea, denies vomiting since yesterday.   VITAL SIGNS: BP 107/65 (BP Location: Right Leg)   Pulse 99   Temp 98.8 F (37.1 C) (Oral)   Resp 20   Ht 6\' 1"  (1.854 m)   Wt 113.9 kg   SpO2 97%   BMI 33.13 kg/m    VENTILATOR SETTINGS:  On room air  INTAKE / OUTPUT: I/O last 3 completed shifts: In: 2234.5 [P.O.:240; I.V.:1884.6; IV Piggyback:110] Out: -443 [Urine:100]  PHYSICAL EXAMINATION: [place]: supine in bed, mild distress, obese HEENT: [place]/AT, no scleral icterus Cardio: RRR, Nl S1/S2 and -M/R/G Respiratory: CTA bilaterally Abdominal: Soft, NT, ND and hypoactive bowel sounds MSK: + pedal pulses, no edema Neuro: A&Ox3, cooperative Skin: c/d/i  LABS:  BMET Recent Labs  Lab 01/14/18 0431 01/14/18 0943 01/15/18 0400  NA 132* 132* 135  K 7.4* 5.6* 5.3*  CL 92* 92* 94*  CO2 26 24 28   BUN 63* 66* 34*  CREATININE 14.46* 14.83* 10.03*  GLUCOSE 106* 121* 112*   Electrolytes Recent Labs  Lab 01/13/18 1047 01/14/18 0431 01/14/18 0943 01/15/18 0400  CALCIUM 8.3* 8.2* 8.5* 8.3*  MG  --  2.5*  --  2.1  PHOS 7.3* 7.2*  --  5.8*   CBC Recent Labs  Lab 01/13/18 2022 01/14/18 0607 01/15/18 0400  WBC 17.6* 20.7* 21.0*  HGB 11.5* 10.3* 8.0*  HCT 35.3* 31.3* 24.4*  PLT 258 255 217   Coag's Recent Labs  Lab 01/13/18 1154 01/14/18 0431 01/15/18 0400  INR 1.18 1.23 1.35    Sepsis Markers Recent Labs  Lab 01/14/18 0943 01/15/18 0400  PROCALCITON 17.92 18.71   ABG No results for input(s): PHART, PCO2ART, PO2ART in the last 168 hours.  Liver Enzymes Recent Labs  Lab 01/12/18 2159 01/13/18 1047 01/15/18 0400  AST 10*  --  20  ALT 8  --  25  ALKPHOS 68  --  70  BILITOT 0.9  --  1.1  ALBUMIN 3.1* 2.6* 2.4*   Cardiac Enzymes Recent Labs  Lab 01/13/18 0526 01/13/18 1156 01/13/18 2022  TROPONINI 0.27* 0.21* 0.20*   Glucose Recent Labs  Lab 01/14/18 1029 01/14/18 1626 01/14/18 1945 01/14/18 2354 01/15/18 0356 01/15/18 0713  GLUCAP 81 96 77 93 106* 88   Imaging Dg Chest Port 1 View  Result Date: 01/14/2018 CLINICAL DATA:  Congestive heart failure EXAM: PORTABLE CHEST 1 VIEW COMPARISON:  07/17/2017 FINDINGS: Chronic cardiomegaly. Abnormal density in the left lower chest probably secondary to a combination of pleural fluid and left lower lobe density. This could be simple atelectasis or pneumonia. The right chest is clear. No sign of pulmonary edema. IMPRESSION: Cardiomegaly. Left effusion. Abnormal left base pulmonary density which could be atelectasis and/or pneumonia. Electronically Signed   By: Nelson Chimes M.D.   On: 01/14/2018 13:00   . [START ON 01/16/2018] ceFEPime (MAXIPIME) IV    . ceFEPime (MAXIPIME) IV    . dextrose 5 % and 0.9% NaCl 20 mL/hr  at 01/15/18 0927  . norepinephrine (LEVOPHED) Adult infusion Stopped (01/15/18 8921)   STUDIES:  CT abdomen pelvis 8/22: New 3.5 x 4.5 cm hypodensity in the left upper kidney with surrounding stranding and small central focus of high density material concerning for bleed underlying lesion not excluded.  Mild left hydroureteronephrosis.  Colonic diverticulosis without diverticulitis.  Mild left base atelectasis  CULTURES: Blood cx. 8/24:   ANTIBIOTICS: none  SIGNIFICANT EVENTS: 8/23 IR left renal artery embolization  AV Fistula left arm  LINES/TUBES: 8/23 - right femoral  CVC  ASSESSMENT / PLAN:   Patient Active Problem List   Diagnosis Date Noted  . History of atrial flutter 01/13/2018  . Hemorrhage of left kidney 01/13/2018  . Acute respiratory failure with hypoxemia ([place]) 07/17/2017  . 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  . Acute on chronic combined systolic and diastolic CHF, NYHA class 4 ([place]) 07/17/2017  . Unresponsiveness 03/16/2017  . Hypotension 03/16/2017  . Paroxysmal atrial flutter ([place]) 03/16/2017  . Chronic pain 03/16/2017  . Leg pain, bilateral 03/16/2017  . Bile leak, postoperative 03/15/2016  . Elevated troponin 03/15/2016  . Rib pain on right side   . Biliary dyskinesia 02/12/2016  . Atrial flutter with rapid ventricular response ([place]) 06/27/2015  . Diverticulitis 04/21/2015  . Acute diverticulitis 04/21/2015  . Diverticulitis of intestine without perforation or abscess without bleeding 04/21/2015  . Pulmonary edema 01/13/2015  . Right upper quadrant abdominal pain 01/13/2015  . Acute respiratory failure with hypoxia ([place]) 01/13/2015  . Diverticulitis large intestine w/o perforation or abscess w/o bleeding 12/18/2014  . Hepatic cyst 12/18/2014  . Aftercare following surgery of the circulatory system, [place] 06/22/2013  . ESRD (end stage renal disease) on dialysis ([place]) 11/28/2012  . Right sided weakness 10/05/2012  . History of nephrectomy 07/04/2012  . End-stage renal disease on hemodialysis ([place]) 06/04/2011  . Systolic CHF, chronic ([place]) 05/19/2011    Class: Acute  . Hypertension, benign 05/19/2011    Class: Chronic  . DM II (diabetes mellitus, type II), controlled ([place]) 05/19/2011    Class: Chronic  . Gout, chronic 05/19/2011    Hemorrhagic shock in setting of left renal hemorrhage (multiple sites bleeding, required embolization of entire renal blood flow to the left kidney emergently on 8/23) exacerbated by Coumadin induced coagulopathy Cannot exclude underlying malignancy. Status post 2 units PRBC as well as Kcentra on 8/23. HD  yesterday. Cont. To have abdominal pain and increasing leukocytosis.    Serial CBC Attempt to get off Norepi today Hold anti-coagulation until cleared Oxy and hydrocodone for pain control If able to get off pressors will transfer out this afternoon  Anemia of chronic disease.  Baseline hemoglobin 11.9.  Actually presented at 13.6.  Blood down to less than 10. Hb 8 today.   See above, holding anticoagulation. CBC and transfuse per ICU protocol  Leukocytosis s/p IR embolization for left renal hemorrhage: procedural vs. Infection. CXR with consolidation LLL.  Trend CBC Cefepime per pharmacy PCT and blood cultures  History of atrial fibrillation. Holding anticoagulation given life-threatening bleed. Will need to discuss when to restart anti-coagulation for a-fib, hold for now.   Continue amiodarone as ordered for rate control When hematuria stops will need to consider adding anticoagulation  Systolic and diastolic cardiomyopathy with ejection fraction of 30% Chronic hypotension on midodrine Scheduled midodrine  Mild Troponin bump in setting of bleeding Troponins improving, will not check more  Hyperkalemia secondary End-stage renal disease.  K+ 7.4. EDW 113.5.  HD per renal  Insulin/D5NS %  for hypoglycemia & hyponatremia Albuterol  Diabetes type 2 Plan  ISS Hold oral agents D5NS 20cc/hr  DVT: SCDs IVF: D5NS 20cc/hr VTE: SCDs  FAMILY  - Updates: Patient updated bedside.  Dispo: Transfer to step down if able to get off pressors today  The patient is critically ill with multiple organ systems failure and requires high complexity decision making for assessment and support, frequent evaluation and titration of therapies, application of advanced monitoring technologies and extensive interpretation of multiple databases.   Critical Care Time devoted to patient care services described in this note is  32  Minutes. This time reflects time of care of this signee Dr Jennet Maduro. This critical care time does not reflect procedure time, or teaching time or supervisory time of PA/NP/Med student/Med Resident etc but could involve care discussion time.  Rush Farmer, M.D. [facility] Pulmonary/Critical Care Medicine. Pager: [phone]. After hours pager: [phone].

## 2018-01-15 NOTE — Progress Notes (Signed)
Patient with multiple readings of hypoglycemia. Patient given apple juice, rechecked, given half amp d50, rechecked and reading 49 and 37 for CBGS. Very difficult capillary stick. Patient's femoral line is not drawing back blood, MD notified. Lab draw ordered. Patient states he does not feel unwell and knows how it feels to have low blood sugar. Will await blood draw results per MD.

## 2018-01-15 NOTE — Progress Notes (Signed)
Chart reviewed and case discussed with icu resident, Patient is stable, TRH will resume care on 8/26.

## 2018-01-15 NOTE — Progress Notes (Signed)
Patient voided on bedside commode, small amount of bright red blood dripping from penile area. RN who took him off bedside cammode estimated blood amount to be around 5-10cc's. Patients hemoglobin just checked which came back at 7.8. Vital signs stable at this time. Will continue to monitor.

## 2018-01-16 LAB — CBC
HCT: 21.6 % — ABNORMAL LOW (ref 39.0–52.0)
Hemoglobin: 7 g/dL — ABNORMAL LOW (ref 13.0–17.0)
MCH: 31.7 pg (ref 26.0–34.0)
MCHC: 32.4 g/dL (ref 30.0–36.0)
MCV: 97.7 fL (ref 78.0–100.0)
Platelets: 202 10*3/uL (ref 150–400)
RBC: 2.21 MIL/uL — ABNORMAL LOW (ref 4.22–5.81)
RDW: 15.9 % — ABNORMAL HIGH (ref 11.5–15.5)
WBC: 15.7 10*3/uL — ABNORMAL HIGH (ref 4.0–10.5)

## 2018-01-16 LAB — BASIC METABOLIC PANEL
Anion gap: 11 (ref 5–15)
BUN: 45 mg/dL — ABNORMAL HIGH (ref 6–20)
CO2: 28 mmol/L (ref 22–32)
Calcium: 8.3 mg/dL — ABNORMAL LOW (ref 8.9–10.3)
Chloride: 93 mmol/L — ABNORMAL LOW (ref 98–111)
Creatinine, Ser: 12.01 mg/dL — ABNORMAL HIGH (ref 0.61–1.24)
GFR calc Af Amer: 5 mL/min — ABNORMAL LOW (ref >60)
GFR calc non Af Amer: 4 mL/min — ABNORMAL LOW (ref >60)
Glucose, Bld: 88 mg/dL (ref 70–99)
Potassium: 5.5 mmol/L — ABNORMAL HIGH (ref 3.5–5.1)
Sodium: 132 mmol/L — ABNORMAL LOW (ref 135–145)

## 2018-01-16 LAB — PROTIME-INR
INR: 1.43
Prothrombin Time: 17.3 seconds — ABNORMAL HIGH (ref 11.4–15.2)

## 2018-01-16 LAB — GLUCOSE, CAPILLARY
Glucose-Capillary: 102 mg/dL — ABNORMAL HIGH (ref 70–99)
Glucose-Capillary: 47 mg/dL — ABNORMAL LOW (ref 70–99)
Glucose-Capillary: 64 mg/dL — ABNORMAL LOW (ref 70–99)
Glucose-Capillary: 67 mg/dL — ABNORMAL LOW (ref 70–99)
Glucose-Capillary: 70 mg/dL (ref 70–99)
Glucose-Capillary: 72 mg/dL (ref 70–99)
Glucose-Capillary: 77 mg/dL (ref 70–99)
Glucose-Capillary: 77 mg/dL (ref 70–99)
Glucose-Capillary: 81 mg/dL (ref 70–99)
Glucose-Capillary: 83 mg/dL (ref 70–99)

## 2018-01-16 LAB — PROCALCITONIN: Procalcitonin: 18.03 ng/mL

## 2018-01-16 LAB — HEMOGLOBIN AND HEMATOCRIT, BLOOD
HCT: ABNORMAL % (ref 39.0–52.0)
Hemoglobin: ABNORMAL g/dL (ref 13.0–17.0)

## 2018-01-16 MED ORDER — DOXERCALCIFEROL 4 MCG/2ML IV SOLN
INTRAVENOUS | Status: AC
  Filled 2018-01-16: qty 2

## 2018-01-16 MED ORDER — GLUCOSE 4 G PO CHEW
3.0000 | CHEWABLE_TABLET | Freq: Once | ORAL | Status: AC
  Administered 2018-01-16: 12 g via ORAL

## 2018-01-16 MED ORDER — GLUCOSE 4 G PO CHEW
CHEWABLE_TABLET | ORAL | Status: AC
  Filled 2018-01-16: qty 3

## 2018-01-16 NOTE — Progress Notes (Signed)
Pt returned from HD. Initial CBG 49. Pt given juice and 12gm glucose tabs. Reassessment of CBG after juice improving, rechecked after time for glucose tab absorption and CBG WNL. All reported to oncoming RN and will continue to monitor closely.  Upon arrival from HD, pt initially calm with no obvious alteration in mentation. Pt became increasingly more agitated, with verbalizations indicating decrease short term memory. [place] RN notified MD and updated to assessment.

## 2018-01-16 NOTE — Progress Notes (Signed)
PT Cancellation Note  Patient Details Name: Dewey Viens MRN: [id number] DOB: 05-Jan-1961   Cancelled Treatment:    Reason Eval/Treat Not Completed: (P) Medical issues which prohibited therapy Pt has a non-tunneled femoral line. Spoke with RN who will clarify if this will come out soon or if not if MD wants to order vitalgo lift tilt bed. Will follow.  Mirra Basilio B. Migdalia Dk PT, DPT Acute Rehabilitation  [phone] Pager [phone]  [place] 01/16/2018, 9:02 AM

## 2018-01-16 NOTE — Progress Notes (Signed)
Called elink, informed nurse Gretchen pt with cbg 70, d5ns 11ml/hr. Will feed pt prior to HD and inform HD staff

## 2018-01-16 NOTE — Progress Notes (Signed)
PROGRESS NOTE  [name] EXB:[id number] DOB: 12/18/1960 DOA: 01/12/2018 PCP: Iona Beard, MD  HPI/Recap of past 24 hours:  Had multiple episode of hypoglycemia episode last night, however, patient denies symptom, Very difficult capillary stick per RN), he is started on d5 infusion at 20cc/hr last night  He continues to have small amount of hematuria (small amount of bright red blood dripping from penile area,estimated blood amount to be around 5-10cc's.per RN)  hgb this am 7 bp stable on midodrine tid  He is  getting dialysis today, patient is seen in dialysis unit  Assessment/Plan: Principal Problem:   Hemorrhage of left kidney Active Problems:   Systolic CHF, chronic (HCC)   ESRD (end stage renal disease) on dialysis ([place])   Hypotension   History of atrial flutter  Hemorrhagic shock, Spontaneous hemorrhage of left kidney in the setting of supratherapeutic INR -H/o RCC s/p  nephrectomy on the  right  -INR 4.59 on presentation, coumadin held, he received vit k/ffp, kcentra and 2units of prbc on day of admission, he is s/p IR embolization for left renal hemorrhage -required embolization of entire renal blood flow to the left kidney emergently on 8/23, he required pressors for bp supports, he is transferred to ICU after IR embolization on day of admission,  -he is improving, off pressor on 8/25, bp stable on midodrine tid -he is  transferred back to hospitalist service on 8/26 -IR/urology consulted, in put appreciated  Blood loss anemia: -hgb baseline above 10 -S/p 2prbc on day of admission -hgb dropping, continue to have small amount of hematuria -case discussed with nephrology PA Mr Zeyfang, blood transfusion per nephrology  Leukocytosis From stress vs infection CXR with consolidation LLL.  procalcitonin 18 Blood culture no growth, mrsa screening negative He is started on cefepime per pharmacy since 8/25, continue for now  ESRD on HD MWF Hyperkalemia HD per  nephrology  Paroxysmal atrial flutter with prior history of stroke ( no residual per patient) -He is currently in sinus rhythm on amiodarone -Has been on Coumadin for 2 years, need to be cleared by urology before restarting back on coumadin. -Coumadin held  Chronic combined CHF Last lvef 30% Volume per dialysis  Mild Troponin bump in setting of bleeding Troponins mild and flat, denies chest pain, ekg no acute st/t changes, will not check more  Chronic hypotension, has been on midodrine prn prior to hospitalization Now on scheduled midodrine  Diet controlled diabetes, has been off diabetic meds since 2013 Has hypoglycemia, but asymptomatic  Right eye partial blindness   Code Status: full  Family Communication: patient   Disposition Plan: remain in stepdown   Consultants:  Critical care  IR  Urology  Nephrology  Procedures:  urgent IR angiogram and embolization on 8/23  Right femoral line placement on 8/23 by IR  prbc transfusion on 8/23  Dialysis   Antibiotics:  Cefepime from 8/25    Objective: BP 125/60   Pulse 90   Temp 98.9 F (37.2 C) (Oral)   Resp 16   Ht 6\' 1"  (1.854 m)   Wt 118 kg   SpO2 91%   BMI 34.32 kg/m   Intake/Output Summary (Last 24 hours) at 01/16/2018 0717 Last data filed at 01/16/2018 0400 Gross per 24 hour  Intake 518.46 ml  Output 10 ml  Net 508.46 ml   Filed Weights   01/14/18 1400 01/14/18 1747 01/16/18 0600  Weight: 112.8 kg 113.9 kg 118 kg    Exam: Patient is examined daily including  today on 01/16/2018, exams remain the same as of yesterday except that has changed    General:  NAD  Cardiovascular: RRR  Respiratory: CTABL  Abdomen: Soft/ND/NT, positive BS  Musculoskeletal: No Edema  Neuro: alert, oriented   Data Reviewed: Basic Metabolic Panel: Recent Labs  Lab 01/13/18 1047 01/14/18 0431 01/14/18 0943 01/15/18 0400 01/15/18 2017 01/16/18 0309  NA 134* 132* 132* 135 132* 132*  K 5.9*  7.4* 5.6* 5.3* 5.5* 5.5*  CL 93* 92* 92* 94* 92* 93*  CO2 21* 26 24 28 25 28   GLUCOSE 228* 106* 121* 112* 78 88  BUN 49* 63* 66* 34* 43* 45*  CREATININE 13.96* 14.46* 14.83* 10.03* 11.35* 12.01*  CALCIUM 8.3* 8.2* 8.5* 8.3* 8.6* 8.3*  MG  --  2.5*  --  2.1  --   --   PHOS 7.3* 7.2*  --  5.8*  --   --    Liver Function Tests: Recent Labs  Lab 01/12/18 2159 01/13/18 1047 01/15/18 0400  AST 10*  --  20  ALT 8  --  25  ALKPHOS 68  --  70  BILITOT 0.9  --  1.1  PROT 7.8  --  6.3*  ALBUMIN 3.1* 2.6* 2.4*   Recent Labs  Lab 01/12/18 2159  LIPASE 38   No results for input(s): AMMONIA in the last 168 hours. CBC: Recent Labs  Lab 01/13/18 1154 01/13/18 2022 01/14/18 0607 01/15/18 0400 01/15/18 1630 01/16/18 0309  WBC 22.0* 17.6* 20.7* 21.0*  --  15.7*  NEUTROABS  --   --   --  17.5*  --   --   HGB 11.6* 11.5* 10.3* 8.0* 7.8* 7.0*  HCT 36.0* 35.3* 31.3* 24.4* 24.2* 21.6*  MCV 98.4 97.0 96.0 97.6  --  97.7  PLT 229 258 255 217  --  202   Cardiac Enzymes:   Recent Labs  Lab 01/13/18 0526 01/13/18 1156 01/13/18 2022  TROPONINI 0.27* 0.21* 0.20*   BNP (last 3 results) Recent Labs    07/18/17 0531  BNP 1,325.2*    ProBNP (last 3 results) No results for input(s): PROBNP in the last 8760 hours.  CBG: Recent Labs  Lab 01/15/18 2017 01/16/18 0021 01/16/18 0054 01/16/18 0318 01/16/18 0641  GLUCAP 67* 67* 81 83 70    Recent Results (from the past 240 hour(s))  MRSA PCR Screening     Status: None   Collection Time: 01/13/18 11:56 AM  Result Value Ref Range Status   MRSA by PCR NEGATIVE NEGATIVE Final    Comment:        The GeneXpert MRSA Assay (FDA approved for NASAL specimens only), is one component of a comprehensive MRSA colonization surveillance program. It is not intended to diagnose MRSA infection nor to guide or monitor treatment for MRSA infections. Performed at [facility], [place] [address] [place], [place] [address]   Culture, blood  (routine x 2)     Status: None (Preliminary result)   Collection Time: 01/14/18 10:54 AM  Result Value Ref Range Status   Specimen Description BLOOD RIGHT HAND  Final   Special Requests   Final    BOTTLES DRAWN AEROBIC ONLY Blood Culture adequate volume   Culture   Final    NO GROWTH 1 DAY Performed at [facility], [place] [address] [place], [place] [address]    Report Status PENDING  Incomplete  Culture, blood (routine x 2)     Status: None (Preliminary result)  Collection Time: 01/14/18 11:03 AM  Result Value Ref Range Status   Specimen Description BLOOD RIGHT HAND  Final   Special Requests   Final    BOTTLES DRAWN AEROBIC ONLY Blood Culture results may not be optimal due to an inadequate volume of blood received in culture bottles   Culture   Final    NO GROWTH 1 DAY Performed at [facility], [place] [address] [place], [place] [address]    Report Status PENDING  Incomplete     Studies: No results found.  Scheduled Meds: . allopurinol  150 mg Oral QODAY  . amiodarone  100 mg Oral Daily  . Chlorhexidine Gluconate Cloth  6 each Topical Q0600  . doxercalciferol  2 mcg Intravenous Q M,W,F-HD  . ferric citrate  210 mg Oral TID WC  . midodrine  10 mg Oral TID WC  . multivitamin  1 tablet Oral QHS  . pantoprazole  40 mg Oral BID  . sodium chloride flush  10-40 mL Intracatheter Q12H    Continuous Infusions: . sodium chloride 250 mL (01/15/18 1102)  . ceFEPime (MAXIPIME) IV    . dextrose 5 % and 0.9% NaCl 20 mL/hr at 01/16/18 0400  . norepinephrine (LEVOPHED) Adult infusion Stopped (01/15/18 6546)     Time spent: 51mins, case discussed with nephrology I have personally reviewed and interpreted on  01/16/2018 daily labs, tele strips, imagings as discussed above under date review session and assessment and plans.  I reviewed all nursing notes, pharmacy notes, consultant notes,  vitals, pertinent old records  I have discussed plan of care as described above with  RN , patient  on 01/16/2018   Florencia Reasons MD, PhD  [facility] Pager [phone]. If 7PM-7AM, please contact night-coverage at [email or web address], password [facility] 01/16/2018, 7:17 AM  LOS: 3 days

## 2018-01-16 NOTE — Progress Notes (Signed)
Pt tx to HD,detailed report to RN earlier and confirmation of report at bs + emphasis on no heparin.

## 2018-01-16 NOTE — Progress Notes (Signed)
Reviewed chart: nephrology/urology/primary Off pressors and BP good Hb last 7 (not transfused) My concern with dialysis is heparin\ The longer we can hold off better Please call me at [phone] to discuss

## 2018-01-16 NOTE — Progress Notes (Signed)
OT Cancellation Note  Patient Details Name: [name] MRN: [id number] DOB: 05-Jul-1960   Cancelled Treatment:    Reason Eval/Treat Not Completed: Other (comment)Pt has a non-tunneled femoral line. Spoke with RN who will clarify if this will come out soon or if not if MD wants to order vitalgo lift tilt bed. Will follow.  [place] Register [phone] 01/16/2018, 8:52 AM

## 2018-01-16 NOTE — Progress Notes (Signed)
[facility] Progress Note  Subjective: no new c/o.  Abd pain is getting better but still present.  Still having a bit of dripping from tip of penis.  and BP's are coming up, improving. Hb down to 7.0 this AM, K stable at 5.5.               Vitals:   01/16/18 0400 01/16/18 0500 01/16/18 0600 01/16/18 0700  BP: 100/61 99/64 (!) 101/58 125/60  Pulse: 85 86 84 90  Resp: 16 13 15 16   Temp:      TempSrc:      SpO2: 94% 92% 93% 91%  Weight:   118 kg   Height:        Inpatient medications: . allopurinol  150 mg Oral QODAY  . amiodarone  100 mg Oral Daily  . Chlorhexidine Gluconate Cloth  6 each Topical Q0600  . doxercalciferol  2 mcg Intravenous Q M,W,F-HD  . ferric citrate  210 mg Oral TID WC  . midodrine  10 mg Oral TID WC  . multivitamin  1 tablet Oral QHS  . pantoprazole  40 mg Oral BID  . sodium chloride flush  10-40 mL Intracatheter Q12H   . sodium chloride 250 mL (01/15/18 1102)  . ceFEPime (MAXIPIME) IV    . dextrose 5 % and 0.9% NaCl 20 mL/hr at 01/16/18 0400  . norepinephrine (LEVOPHED) Adult infusion Stopped (01/15/18 0647)   sodium chloride, acetaminophen **OR** acetaminophen, HYDROcodone-acetaminophen, ondansetron **OR** ondansetron (ZOFRAN) IV, oxyCODONE, sodium chloride flush  Iron/TIBC/Ferritin/ %Sat    Component Value Date/Time   IRON 40 (L) 05/22/2011 0600   TIBC 224 05/22/2011 0600   FERRITIN 422 (H) 05/22/2011 0600   IRONPCTSAT 18 (L) 05/22/2011 0600    Exam: General: WDWN male lying bed NAD  Head: NCAT sclera not icteric MMM Neck: Supple. No JVD No masses Lungs: CTA bilaterally without wheezes, rales, or rhonchi. Breathing is unlabored. Heart: RRR with S1 S2 Abdomen: soft NT + BS Lower extremities: no edema Upper extremities: LUE AVF +bruit, mild hand edema Neuro: A & O  X 3. Moves all extremities spontaneously. Psych:  Responds to questions appropriately with a normal affect.   Dialysis:  [place]  MWF  4h  111.5kg  2/2.25 bath   P4  LUE AVF  Hep 7000 +3000 midrun Hectorol 89mcg IV TIW Parsabiv 12.5mg  IV TIW  Assessment: 1. L renal hemorrhage - s/p L renal artery embolization by IR 8/23. Abd pain improving. Hb down some today to 7.0.  Possibly dilutional.  2. Hypotension - much better, off of levophed now. BP's up. Gets midodrine at home 10 mg pre HD on MWF and also 5mg  prn on nonHD days if SBP < 85. Here pt is getting 1mg  tid, this may need to be reduced if BP's running higher; will not change for now in light of modest hypotension and upcoming HD today.  3. Hyperkalemia - stable at 5.5; in light of large hematoma will likely remain elevated for a while.  4. ESRD - MWF HD. HD today. Dr. Jonnie Finner spoke w/ urology , recommended continue holding heparin until more stable (Hb and BP's) - will hold today. 5. Anemia of CKD/ abl - is sp 2 units prbc's on 8/23, Hb 7.0 today.  Will administer ESA if no contraindications. 6. Metabolic bone disease -  Ca ok. Phos elevated. Resume binders when eating. Continue Hectorol.  No parsabiv here.  7. Aflutter - on amiodarone. Coumadin on hold.  8. Hx RCC  s/p right nephrectomy 9. NICM EF 30-35%  10. DM 2 stable  Plan - as above   Matthew Hick MD [facility] pager [phone]   01/16/2018, 7:35 AM   Recent Labs  Lab 01/13/18 1047  01/14/18 0431  01/15/18 0400 01/15/18 2017 01/16/18 0309  NA 134*  --  132*   < > 135 132* 132*  K 5.9*  --  7.4*   < > 5.3* 5.5* 5.5*  CL 93*  --  92*   < > 94* 92* 93*  CO2 21*  --  26   < > 28 25 28   GLUCOSE 228*  --  106*   < > 112* 78 88  BUN 49*  --  63*   < > 34* 43* 45*  CREATININE 13.96*  --  14.46*   < > 10.03* 11.35* 12.01*  CALCIUM 8.3*  --  8.2*   < > 8.3* 8.6* 8.3*  PHOS 7.3*  --  7.2*  --  5.8*  --   --   ALBUMIN 2.6*  --   --   --  2.4*  --   --   INR  --    < > 1.23  --  1.35  --  1.43   < > = values in this interval not displayed.   Recent Labs  Lab 01/12/18 2159 01/15/18 0400  AST 10* 20  ALT 8 25   ALKPHOS 68 70  BILITOT 0.9 1.1  PROT 7.8 6.3*   Recent Labs  Lab 01/15/18 0400 01/15/18 1630 01/16/18 0309  WBC 21.0*  --  15.7*  NEUTROABS 17.5*  --   --   HGB 8.0* 7.8* 7.0*  HCT 24.4* 24.2* 21.6*  MCV 97.6  --  97.7  PLT 217  --  202

## 2018-01-17 LAB — TYPE AND SCREEN
ABO/RH(D): A POS
Antibody Screen: NEGATIVE
Unit division: 0
Unit division: 0
Unit division: 0
Unit division: 0
Unit division: 0

## 2018-01-17 LAB — CBC
HCT: 19.6 % — ABNORMAL LOW (ref 39.0–52.0)
Hemoglobin: 6.3 g/dL — CL (ref 13.0–17.0)
MCH: 31.8 pg (ref 26.0–34.0)
MCHC: 32.1 g/dL (ref 30.0–36.0)
MCV: 99 fL (ref 78.0–100.0)
Platelets: 243 10*3/uL (ref 150–400)
RBC: 1.98 MIL/uL — ABNORMAL LOW (ref 4.22–5.81)
RDW: 15.8 % — ABNORMAL HIGH (ref 11.5–15.5)
WBC: 10 10*3/uL (ref 4.0–10.5)

## 2018-01-17 LAB — GLUCOSE, CAPILLARY
Glucose-Capillary: 119 mg/dL — ABNORMAL HIGH (ref 70–99)
Glucose-Capillary: 69 mg/dL — ABNORMAL LOW (ref 70–99)
Glucose-Capillary: 70 mg/dL (ref 70–99)
Glucose-Capillary: 79 mg/dL (ref 70–99)
Glucose-Capillary: 84 mg/dL (ref 70–99)
Glucose-Capillary: 85 mg/dL (ref 70–99)
Glucose-Capillary: 91 mg/dL (ref 70–99)
Glucose-Capillary: 92 mg/dL (ref 70–99)
Glucose-Capillary: 95 mg/dL (ref 70–99)

## 2018-01-17 LAB — PROTIME-INR
INR: 1.36
Prothrombin Time: 16.6 seconds — ABNORMAL HIGH (ref 11.4–15.2)

## 2018-01-17 LAB — BASIC METABOLIC PANEL
Anion gap: 10 (ref 5–15)
BUN: 28 mg/dL — ABNORMAL HIGH (ref 6–20)
CO2: 28 mmol/L (ref 22–32)
Calcium: 8.4 mg/dL — ABNORMAL LOW (ref 8.9–10.3)
Chloride: 98 mmol/L (ref 98–111)
Creatinine, Ser: 8.98 mg/dL — ABNORMAL HIGH (ref 0.61–1.24)
GFR calc Af Amer: 7 mL/min — ABNORMAL LOW (ref >60)
GFR calc non Af Amer: 6 mL/min — ABNORMAL LOW (ref >60)
Glucose, Bld: 83 mg/dL (ref 70–99)
Potassium: 5.1 mmol/L (ref 3.5–5.1)
Sodium: 136 mmol/L (ref 135–145)

## 2018-01-17 LAB — BPAM RBC
Blood Product Expiration Date: 201908272359
Blood Product Expiration Date: 201908282359
Blood Product Expiration Date: 201909082359
Blood Product Expiration Date: 201909152359
Blood Product Expiration Date: 201909152359
ISSUE DATE / TIME: 201908230836
ISSUE DATE / TIME: 201908230923
ISSUE DATE / TIME: 201908230924
ISSUE DATE / TIME: 201908230924
ISSUE DATE / TIME: 201908260942
Unit Type and Rh: 600
Unit Type and Rh: 600
Unit Type and Rh: 6200
Unit Type and Rh: 6200
Unit Type and Rh: 6200

## 2018-01-17 LAB — PREPARE RBC (CROSSMATCH)

## 2018-01-17 MED ORDER — DARBEPOETIN ALFA 60 MCG/0.3ML IJ SOSY
60.0000 ug | PREFILLED_SYRINGE | INTRAMUSCULAR | Status: DC
  Administered 2018-01-18: 60 ug via INTRAVENOUS
  Filled 2018-01-17: qty 0.3

## 2018-01-17 MED ORDER — DEXTROSE 10 % IV SOLN
INTRAVENOUS | Status: DC
  Administered 2018-01-17: 16:00:00 via INTRAVENOUS

## 2018-01-17 MED ORDER — DEXTROSE 50 % IV SOLN
INTRAVENOUS | Status: AC
  Administered 2018-01-17: 25 mL
  Filled 2018-01-17: qty 50

## 2018-01-17 MED ORDER — CHLORPROMAZINE HCL 10 MG PO TABS
10.0000 mg | ORAL_TABLET | Freq: Three times a day (TID) | ORAL | Status: DC | PRN
  Administered 2018-01-17 – 2018-01-21 (×3): 10 mg via ORAL
  Filled 2018-01-17 (×5): qty 1

## 2018-01-17 MED ORDER — MIRTAZAPINE 15 MG PO TABS
7.5000 mg | ORAL_TABLET | Freq: Every day | ORAL | Status: DC
  Administered 2018-01-17 – 2018-01-22 (×6): 7.5 mg via ORAL
  Filled 2018-01-17 (×7): qty 1

## 2018-01-17 MED ORDER — SODIUM CHLORIDE 0.9% IV SOLUTION
Freq: Once | INTRAVENOUS | Status: AC
  Administered 2018-01-17: 12:00:00 via INTRAVENOUS

## 2018-01-17 MED ORDER — ALTEPLASE 2 MG IJ SOLR
2.0000 mg | Freq: Once | INTRAMUSCULAR | Status: AC
  Administered 2018-01-18: 2 mg
  Filled 2018-01-17: qty 2

## 2018-01-17 NOTE — Progress Notes (Signed)
Occupational Therapy Evaluation Patient Details Name: [name] MRN: [id number] DOB: December 25, 1960 Today's Date: 01/17/2018    History of Present Illness 57 year old w/ sig h/o ESRD (MWF HD), prior right nephrectomy, a flutter (on amio and warfarin), CHF and chronic hypotension (on midodrine). Usually anuric but went to the BR on 8/22 and voided BRB. DX eval in ER: large renal hematoma 3.5x5cm w/ mild hydro. Hgb 13.6 and INR was 4.59  on arrival. Admitted w/ left renal hemorrhage w/ significant associated flank pain.8/23 While in IR undergoing embolization became progressively hypotensive and tachycardic.    Clinical Impression   Nsg received OK from MD to mobilize pt with femoral triple lumen. PTA, pt lived with wife and wasindependent with ADL and mobility. Pt currently requires min A for limited mobility and ADL. Feel pt will progress to be able to DC home with [place]. Will follow acutely to facilitate safe DC home.    Follow Up Recommendations  Home health OT;Supervision - Intermittent    Equipment Recommendations  3 in 1 bedside commode    Recommendations for Other Services PT consult     Precautions / Restrictions Precautions Precautions: Fall      Mobility Bed Mobility               General bed mobility comments: OOB in chair  Transfers Overall transfer level: Needs assistance   Transfers: Sit to/from Stand;Stand Pivot Transfers Sit to Stand: Min guard Stand pivot transfers: Min assist            Balance Overall balance assessment: Needs assistance   Sitting balance-Leahy Scale: Good       Standing balance-Leahy Scale: Fair                             ADL either performed or assessed with clinical judgement   ADL Overall ADL's : Needs assistance/impaired     Grooming: Set up;Sitting   Upper Body Bathing: Set up;Sitting   Lower Body Bathing: Minimal assistance;Sit to/from stand   Upper Body Dressing : Set up;Sitting   Lower Body  Dressing: Moderate assistance;Sit to/from stand   Toilet Transfer: Minimal assistance;BSC;Stand-pivot(3 steps to commode)   Toileting- Clothing Manipulation and Hygiene: Minimal assistance;Sit to/from stand       Functional mobility during ADLs: Minimal assistance General ADL Comments: lines limiting mobility at this time     Vision         Perception     Praxis      Pertinent Vitals/Pain Pain Assessment: No/denies pain     Hand Dominance Right   Extremity/Trunk Assessment Upper Extremity Assessment Upper Extremity Assessment: Overall WFL for tasks assessed(numbness R lateral palm)   Lower Extremity Assessment Lower Extremity Assessment: Defer to PT evaluation   Cervical / Trunk Assessment Cervical / Trunk Assessment: Normal   Communication Communication Communication: No difficulties   Cognition Arousal/Alertness: Awake/alert Behavior During Therapy: WFL for tasks assessed/performed Overall Cognitive Status: Within Functional Limits for tasks assessed                                     General Comments       Exercises     Shoulder Instructions      Home Living Family/patient expects to be discharged to:: Private residence Living Arrangements: Spouse/significant other Available Help at Discharge: Family;Available 24 hours/day Type of Home: House  Home Access: Stairs to enter Entrance Stairs-Number of Steps: 1 Entrance Stairs-Rails: Right Home Layout: One level     Bathroom Shower/Tub: Corporate investment banker: Standard Bathroom Accessibility: Yes How Accessible: Accessible via walker Home Equipment: Walker - 2 wheels;Crutches          Prior Functioning/Environment Level of Independence: Independent        Comments: drives himself to dialysis; enjoys mowing lawns        OT Problem List: Decreased strength;Decreased activity tolerance;Decreased knowledge of use of DME or AE;Cardiopulmonary status limiting  activity;Obesity      OT Treatment/Interventions: Self-care/ADL training;Therapeutic exercise;Energy conservation;DME and/or AE instruction;Therapeutic activities;Patient/family education;Balance training    OT Goals(Current goals can be found in the care plan section) Acute Rehab OT Goals Patient Stated Goal: to get stronger OT Goal Formulation: With patient Time For Goal Achievement: 01/31/18 Potential to Achieve Goals: Good  OT Frequency: Min 2X/week   Barriers to D/C:            Co-evaluation              AM-PAC PT "6 Clicks" Daily Activity     Outcome Measure Help from another person eating meals?: None Help from another person taking care of personal grooming?: None Help from another person toileting, which includes using toliet, bedpan, or urinal?: A Little Help from another person bathing (including washing, rinsing, drying)?: A Little Help from another person to put on and taking off regular upper body clothing?: None Help from another person to put on and taking off regular lower body clothing?: A Little 6 Click Score: 21   End of Session Equipment Utilized During Treatment: Gait belt Nurse Communication: Mobility status  Activity Tolerance: Patient tolerated treatment well Patient left: Other (comment)(on BSC; nsg aware)  OT Visit Diagnosis: Unsteadiness on feet (R26.81);Muscle weakness (generalized) (M62.81)                Time: 4481-8563 OT Time Calculation (min): 25 min Charges:  OT General Charges $OT Visit: 1 Visit OT Evaluation $OT Eval Moderate Complexity: 1 Mod OT Treatments $Self Care/Home Management : 8-22 mins  Maurie Boettcher, OT/L  OT Clinical Specialist [phone]   [facility] 01/17/2018, 5:23 PM

## 2018-01-17 NOTE — Progress Notes (Addendum)
Subjective:  Feels warm, mild abdominal discomfort ,2 loose stools  This am . HD  yest  Tolerated BUT time shortening Again ~~ 50 min with clotted system sec no hep HD /  hgb 7 . 6.3 this am .repoted scant hematuria  This am   Objective Vital signs in last 24 hours: Vitals:   01/16/18 2137 01/17/18 0140 01/17/18 0500 01/17/18 0800  BP: 114/60 98/62    Pulse: (!) 105  (!) 103   Resp: 19  (!) 23   Temp: 100 F (37.8 C) 99.3 F (37.4 C)  98.5 F (36.9 C)  TempSrc: Oral Oral  Oral  SpO2: 98%  100%   Weight:      Height:       Weight change: 0.4 kg  Physical Exam: General:  Obese WDWN male in bed  NAD  Heart: RRR  1/6 sem  No rub or gallop Lungs: CTA  Abdomen: BS pos . Soft , min tender epigatric.  Extremities: No pedal edeam / R hand mild swelling  Dialysis Access: Pos bruit  LUA AVF   OP Dialysis: [place]  MWF  4h  111.5kg  2/2.25 bath  P4  LUE AVF  Hep 7000 +3000 midrun Hectorol 49mcg IV TIW Parsabiv 12.5mg  IV TIW  Problem/Plan: 1. L renal hemorrhage - s/p L renal artery embolization by IR 8/23. Abd pain improving. Hb down today to 7.0 >6.3 . 2. Hypotension - better, Now off of levophed in 5west now.. Gets midodrine at home 10 mg pre HD on MWF and also 5mg  prn on nonHD days if SBP < 85. Here pt is getting 10 mg tid, this may need to be reduced if BP's running higher; will not change for now in light of modest hypotension . / BY  BED Wt 6 kg > edw  but not sob  /attempt  2.5  3l  l Uf on hd tomor  With albumin support /midodirne  3. Hyperkalemia - stable at 5.1; in light of large hematoma will likely remain elevated for a while.  4. ESRD -MWF HD. HD  on schedule . Dr. Jonnie Finner spoke w/ urology , recommended continue holding heparin until more stable (Hb and BP's) - will hold . 5. Anemia of CKD/ abl - is sp 2 units prbc's on 8/23, Hb 7.0 yest > 6.3 today . Aranesp give 60 mcg q wed hd start tomor . Check iron stores also  6. Low grade temp - BC  8/24 no growth / wbc 10 this am    7. Metabolic bone disease -Ca ok. Phos  7.3 >5.8 with . Resumed binders when eating. Continue Hectorol.  No parsabiv here.  8. Aflutter - on amiodarone. Coumadin on hold.  9. Hx RCC s/p right nephrectomy 01/13/18  10. NICM EF 30-35%    Ernest Haber, PA-C [facility] [phone] 01/17/2018,10:50 AM  LOS: 4 days   Labs: Basic Metabolic Panel: Recent Labs  Lab 01/13/18 1047 01/14/18 0431  01/15/18 0400 01/15/18 2017 01/16/18 0309 01/17/18 0758  NA 134* 132*   < > 135 132* 132* 136  K 5.9* 7.4*   < > 5.3* 5.5* 5.5* 5.1  CL 93* 92*   < > 94* 92* 93* 98  CO2 21* 26   < > 28 25 28 28   GLUCOSE 228* 106*   < > 112* 78 88 83  BUN 49* 63*   < > 34* 43* 45* 28*  CREATININE 13.96* 14.46*   < > 10.03*  11.35* 12.01* 8.98*  CALCIUM 8.3* 8.2*   < > 8.3* 8.6* 8.3* 8.4*  PHOS 7.3* 7.2*  --  5.8*  --   --   --    < > = values in this interval not displayed.   Liver Function Tests: Recent Labs  Lab 01/12/18 2159 01/13/18 1047 01/15/18 0400  AST 10*  --  20  ALT 8  --  25  ALKPHOS 68  --  70  BILITOT 0.9  --  1.1  PROT 7.8  --  6.3*  ALBUMIN 3.1* 2.6* 2.4*   Recent Labs  Lab 01/12/18 2159  LIPASE 38   No results for input(s): AMMONIA in the last 168 hours. CBC: Recent Labs  Lab 01/13/18 2022 01/14/18 0607 01/15/18 0400  01/16/18 0309 01/16/18 1925 01/17/18 0534  WBC 17.6* 20.7* 21.0*  --  15.7*  --  10.0  NEUTROABS  --   --  17.5*  --   --   --   --   HGB 11.5* 10.3* 8.0*   < > 7.0* ABNORMAL 6.3*  HCT 35.3* 31.3* 24.4*   < > 21.6* ABNORMAL 19.6*  MCV 97.0 96.0 97.6  --  97.7  --  99.0  PLT 258 255 217  --  202  --  243   < > = values in this interval not displayed.   Cardiac Enzymes: Recent Labs  Lab 01/13/18 0526 01/13/18 1156 01/13/18 2022  TROPONINI 0.27* 0.21* 0.20*   CBG: Recent Labs  Lab 01/17/18 0420 01/17/18 0438 01/17/18 0456 01/17/18 0757 01/17/18 0833  GLUCAP 70 69* 119* 91 85    Studies/Results: No results  found. Medications: . sodium chloride 250 mL (01/15/18 1102)  . ceFEPime (MAXIPIME) IV 1 g (01/17/18 0003)  . dextrose 5 % and 0.9% NaCl 20 mL/hr at 01/16/18 2100  . norepinephrine (LEVOPHED) Adult infusion Stopped (01/15/18 0647)   . sodium chloride   Intravenous Once  . allopurinol  150 mg Oral QODAY  . amiodarone  100 mg Oral Daily  . Chlorhexidine Gluconate Cloth  6 each Topical Q0600  . doxercalciferol  2 mcg Intravenous Q M,W,F-HD  . ferric citrate  210 mg Oral TID WC  . midodrine  10 mg Oral TID WC  . multivitamin  1 tablet Oral QHS  . pantoprazole  40 mg Oral BID  . sodium chloride flush  10-40 mL Intracatheter Q12H

## 2018-01-17 NOTE — Progress Notes (Signed)
Pt CBG 70 gave orange juice rechecked in 63mins drop down to 69 iv Dex50% 32mls give rechecked CBG 119  After 11mins  Will continue to monitor

## 2018-01-17 NOTE — Care Management Important Message (Signed)
Important Message  Patient Details  Name: [name] MRN: [id number] Date of Birth: 01-02-1961   Medicare Important Message Given:  Yes    Orbie Pyo 01/17/2018, 3:42 PM

## 2018-01-17 NOTE — Progress Notes (Signed)
PT Cancellation Note  Patient Details Name: Madoc Holquin MRN: [id number] DOB: 01-13-1961   Cancelled Treatment:    Reason Eval/Treat Not Completed: Medical issues which prohibited therapy; unable to mobilize with R femoral catheter.  Will attempt another day.   Reginia Naas 01/17/2018, 8:58 AM Magda Kiel, [place] 01/17/2018

## 2018-01-17 NOTE — Progress Notes (Signed)
CRITICAL VALUE ALERT  Critical Value:  Hgb 6.3  Date & Time Notied:  01/17/2018; 07:47  Provider Notified: Dr. Erlinda Hong  Orders Received/Actions taken:

## 2018-01-17 NOTE — Progress Notes (Signed)
PROGRESS NOTE  [name] GQQ:[id number] DOB: 12-18-1960 DOA: 01/12/2018 PCP: Iona Beard, MD  Brief Summary:  H/o PAF on coumadin, h/o ESRD on HD presented with Spont hemorrhage from left kidney with supertherapeutic INR, s/p vit K, ffp/kcentra Hemorrhagic shock, needing pressor, admitted to ICU, improving , off presser Critical care/IR/urology/nephrology consulted     HPI/Recap of past 24 hours:  Still has episodes of hypoglycemia episode while on d5, wife said he was confused this am during the hypoglycemia episode. Currently patient is awake and aaox3  He continues to c/o abdominal pain and left sided flank pain, he continues to have gross hematuria ( he report urine color is not lighter), he reports he feels like he need to pee frequently, he denies dysuria, no fever.  He hgb this am is 6.3  bp stable on midodrine tid  Family at bedside   Assessment/Plan: Principal Problem:   Hemorrhage of left kidney Active Problems:   Systolic CHF, chronic (HCC)   ESRD (end stage renal disease) on dialysis (HCC)   Hypotension   History of atrial flutter  Hemorrhagic shock, Spontaneous hemorrhage of left kidney in the setting of supratherapeutic INR -H/o RCC s/p  nephrectomy on the  right  -INR 4.59 on presentation, coumadin held, he received vit k/ffp, kcentra and 2units of prbc on day of admission, he is s/p IR embolization for left renal hemorrhage -required embolization of entire renal blood flow (left main renal artery)to the left kidney emergently on 8/23, he required pressors for bp supports, he is transferred to ICU after IR embolization on day of admission,  -he is improving, off pressor on 8/25, bp stable on midodrine tid -he is  transferred back to hospitalist service on 8/26 -hgb continue to drop, he continues to have gross hematuria, he continues to have ab and left flank pain -case discussed with IR on call Dr Annamaria Boots on 8/27 who reviewed imaging from 8/23 and states  left renal artery embolization on 8/23 was successful, no more IR intervention. Advise to discuss case with urology. -case discussed with urology Dr Matilde Sprang who recommend conservation management for now with prn blood transfusion and pain control, Dr Matilde Sprang will continue following patient. -he has poor IV access, will keep right femoral central line ( placed by IR on 8/23) in for now -IR/urology  input appreciated  Blood loss anemia with h/o anemia of chronic disease: -hgb baseline above 10, he is on iron based binder at home -He is on aranesp injection per nephrology -S/p 2prbc on day of admission on 8/23 -hgb dropping, hgb 6.3, continue to have small amount of hematuria, prbc x1 unit on 8/27,  -case discussed with nephrology Dr Johnney Ou, further blood transfusion during dialysis per nephrology Leeann Must patient needs urgent blood transfusion)  Leukocytosis From stress vs infection CXR with consolidation LLL.  procalcitonin 18 Blood culture no growth, mrsa screening negative He is started on cefepime per pharmacy since 8/25, wbc normalized on 8/27, continue cefepime to finish total of 7 days  ESRD on HD MWF -HD MWF at [facility]. On HD since 05/2010.  dialysis through left avf. -had Hyperkalemia k went up to 7.4 on 8/24, treated, k now normalized -HD  And blood transfusion per nephrology  Paroxysmal atrial flutter with prior history of stroke ( no residual per patient, but with some memory impairment at baseline) -He is currently in sinus rhythm on amiodarone -Has been on Coumadin for 2 years, need to be cleared by urology before restarting  back on anticoagulation, he does not wants to be back on coumadin if there is another agent we can choose.  -he reports last  INR in the clinic prior to hospitalization was 5.8 -Coumadin held  Chronic combined CHF Last lvef 30% Volume per dialysis  Mild Troponin bump in setting of bleeding Troponins mild and flat, denies  chest pain, ekg no acute st/t changes, will not check more  Chronic hypotension, has been on midodrine prn prior to hospitalization Now on scheduled midodrine  Diet controlled diabetes, he was on metformin briefly for two years, has been off diabetic meds since 2013 Has symptomatic hypoglycemia He reports feeling nauseous, poor appetite, ( not sure if iron based binder could be the cause of his gi symptom, he does not appear to have long standing diabetes make gastroparesis less likely), nephrology to decide on binder choice. No bowel obstruction on ct scan, he is having bm Has persistent hypoglycemia while on d5, change d5 to d10,  Trial dose of remeron qhs started on 8/27  Right eye partial blindness   Code Status: full  Family Communication: patient and family at bedside  Disposition Plan: remain in stepdown   Consultants:  Critical care  IR  Urology  Nephrology  Procedures:  urgent IR angiogram and embolization on 8/23  Right femoral line placement on 8/23 by IR  prbc transfusion on 8/23 and on 8/27  Dialysis MWF   Antibiotics:  Cefepime from 8/25    Objective: BP 98/62 (BP Location: Right Arm)   Pulse (!) 103   Temp 99.3 F (37.4 C) (Oral)   Resp (!) 23   Ht 6\' 1"  (1.854 m)   Wt 117.1 kg   SpO2 100%   BMI 34.06 kg/m   Intake/Output Summary (Last 24 hours) at 01/17/2018 0755 Last data filed at 01/17/2018 8366 Gross per 24 hour  Intake 1085.21 ml  Output 1619 ml  Net -533.79 ml   Filed Weights   01/16/18 0600 01/16/18 1400 01/16/18 1725  Weight: 118 kg 118.4 kg 117.1 kg    Exam: Patient is examined daily including today on 01/17/2018, exams remain the same as of yesterday except that has changed    General:  NAD  Cardiovascular: RRR  Respiratory: CTABL  Abdomen: mild diffuse tender left sided ab, no guarding, no rebound, Soft, positive BS  Musculoskeletal: No Edema  Neuro: alert, oriented , impaired memory, immediate recall  2/3 ( per family, patient has baseline memory issues)  Data Reviewed: Basic Metabolic Panel: Recent Labs  Lab 01/13/18 1047 01/14/18 0431 01/14/18 0943 01/15/18 0400 01/15/18 2017 01/16/18 0309  NA 134* 132* 132* 135 132* 132*  K 5.9* 7.4* 5.6* 5.3* 5.5* 5.5*  CL 93* 92* 92* 94* 92* 93*  CO2 21* 26 24 28 25 28   GLUCOSE 228* 106* 121* 112* 78 88  BUN 49* 63* 66* 34* 43* 45*  CREATININE 13.96* 14.46* 14.83* 10.03* 11.35* 12.01*  CALCIUM 8.3* 8.2* 8.5* 8.3* 8.6* 8.3*  MG  --  2.5*  --  2.1  --   --   PHOS 7.3* 7.2*  --  5.8*  --   --    Liver Function Tests: Recent Labs  Lab 01/12/18 2159 01/13/18 1047 01/15/18 0400  AST 10*  --  20  ALT 8  --  25  ALKPHOS 68  --  70  BILITOT 0.9  --  1.1  PROT 7.8  --  6.3*  ALBUMIN 3.1* 2.6* 2.4*   Recent Labs  Lab 01/12/18 2159  LIPASE 38   No results for input(s): AMMONIA in the last 168 hours. CBC: Recent Labs  Lab 01/13/18 1154 01/13/18 2022 01/14/18 0607 01/15/18 0400 01/15/18 1630 01/16/18 0309 01/16/18 1925  WBC 22.0* 17.6* 20.7* 21.0*  --  15.7*  --   NEUTROABS  --   --   --  17.5*  --   --   --   HGB 11.6* 11.5* 10.3* 8.0* 7.8* 7.0* ABNORMAL  HCT 36.0* 35.3* 31.3* 24.4* 24.2* 21.6* ABNORMAL  MCV 98.4 97.0 96.0 97.6  --  97.7  --   PLT 229 258 255 217  --  202  --    Cardiac Enzymes:   Recent Labs  Lab 01/13/18 0526 01/13/18 1156 01/13/18 2022  TROPONINI 0.27* 0.21* 0.20*   BNP (last 3 results) Recent Labs    07/18/17 0531  BNP 1,325.2*    ProBNP (last 3 results) No results for input(s): PROBNP in the last 8760 hours.  CBG: Recent Labs  Lab 01/16/18 1926 01/17/18 0010 01/17/18 0420 01/17/18 0438 01/17/18 0456  GLUCAP 102* 92 70 69* 119*    Recent Results (from the past 240 hour(s))  MRSA PCR Screening     Status: None   Collection Time: 01/13/18 11:56 AM  Result Value Ref Range Status   MRSA by PCR NEGATIVE NEGATIVE Final    Comment:        The GeneXpert MRSA Assay (FDA approved for  NASAL specimens only), is one component of a comprehensive MRSA colonization surveillance program. It is not intended to diagnose MRSA infection nor to guide or monitor treatment for MRSA infections. Performed at [facility], [place] [address], [place], [place] [address]   Culture, blood (routine x 2)     Status: None (Preliminary result)   Collection Time: 01/14/18 10:54 AM  Result Value Ref Range Status   Specimen Description BLOOD RIGHT HAND  Final   Special Requests   Final    BOTTLES DRAWN AEROBIC ONLY Blood Culture adequate volume   Culture   Final    NO GROWTH 2 DAYS Performed at [facility], [place] [address] [place], [place] [address]    Report Status PENDING  Incomplete  Culture, blood (routine x 2)     Status: None (Preliminary result)   Collection Time: 01/14/18 11:03 AM  Result Value Ref Range Status   Specimen Description BLOOD RIGHT HAND  Final   Special Requests   Final    BOTTLES DRAWN AEROBIC ONLY Blood Culture results may not be optimal due to an inadequate volume of blood received in culture bottles   Culture   Final    NO GROWTH 2 DAYS Performed at [facility], [place] [address], [place], [place] [address]    Report Status PENDING  Incomplete     Studies: No results found.  Scheduled Meds: . sodium chloride   Intravenous Once  . allopurinol  150 mg Oral QODAY  . amiodarone  100 mg Oral Daily  . Chlorhexidine Gluconate Cloth  6 each Topical Q0600  . doxercalciferol  2 mcg Intravenous Q M,W,F-HD  . ferric citrate  210 mg Oral TID WC  . midodrine  10 mg Oral TID WC  . multivitamin  1 tablet Oral QHS  . pantoprazole  40 mg Oral BID  . sodium chloride flush  10-40 mL Intracatheter Q12H    Continuous Infusions: . sodium chloride 250 mL (01/15/18 1102)  . ceFEPime (MAXIPIME)  IV 1 g (01/17/18 0003)  . dextrose 5 % and 0.9% NaCl 20 mL/hr at 01/16/18 2100  . norepinephrine (LEVOPHED) Adult infusion Stopped (01/15/18 3662)     Time  spent: 19mins, case discussed with nephrology, IR and urology I have personally reviewed and interpreted on  01/17/2018 daily labs, tele strips, imagings as discussed above under date review session and assessment and plans.  I reviewed all nursing notes, pharmacy notes, consultant notes,  vitals, pertinent old records  I have discussed plan of care as described above with RN , patient and family on 01/17/2018   Florencia Reasons MD, PhD  [facility] Pager [phone]. If 7PM-7AM, please contact night-coverage at [email or web address], password [facility] 01/17/2018, 7:55 AM  LOS: 4 days

## 2018-01-17 NOTE — Progress Notes (Signed)
OT Cancellation Note  Patient Details Name: [name] MRN: [id number] DOB: 1960-10-22   Cancelled Treatment:    Reason Eval/Treat Not Completed: Other (comment). Unable to mobilize due to R femoral catheter.   Ramond Dial, OT/L  OT Clinical Specialist [phone]  01/17/2018, 8:41 AM

## 2018-01-18 LAB — RENAL FUNCTION PANEL
Albumin: 2.1 g/dL — ABNORMAL LOW (ref 3.5–5.0)
Anion gap: 10 (ref 5–15)
BUN: 38 mg/dL — ABNORMAL HIGH (ref 6–20)
CO2: 25 mmol/L (ref 22–32)
Calcium: 8.7 mg/dL — ABNORMAL LOW (ref 8.9–10.3)
Chloride: 98 mmol/L (ref 98–111)
Creatinine, Ser: 11.47 mg/dL — ABNORMAL HIGH (ref 0.61–1.24)
GFR calc Af Amer: 5 mL/min — ABNORMAL LOW (ref >60)
GFR calc non Af Amer: 4 mL/min — ABNORMAL LOW (ref >60)
Glucose, Bld: 104 mg/dL — ABNORMAL HIGH (ref 70–99)
Phosphorus: 4.8 mg/dL — ABNORMAL HIGH (ref 2.5–4.6)
Potassium: 5 mmol/L (ref 3.5–5.1)
Sodium: 133 mmol/L — ABNORMAL LOW (ref 135–145)

## 2018-01-18 LAB — GLUCOSE, CAPILLARY
Glucose-Capillary: 102 mg/dL — ABNORMAL HIGH (ref 70–99)
Glucose-Capillary: 129 mg/dL — ABNORMAL HIGH (ref 70–99)
Glucose-Capillary: 62 mg/dL — ABNORMAL LOW (ref 70–99)
Glucose-Capillary: 67 mg/dL — ABNORMAL LOW (ref 70–99)
Glucose-Capillary: 72 mg/dL (ref 70–99)
Glucose-Capillary: 79 mg/dL (ref 70–99)
Glucose-Capillary: 80 mg/dL (ref 70–99)
Glucose-Capillary: 92 mg/dL (ref 70–99)

## 2018-01-18 LAB — CBC
HCT: 21.2 % — ABNORMAL LOW (ref 39.0–52.0)
HCT: 26.6 % — ABNORMAL LOW (ref 39.0–52.0)
Hemoglobin: 6.7 g/dL — CL (ref 13.0–17.0)
Hemoglobin: 8.9 g/dL — ABNORMAL LOW (ref 13.0–17.0)
MCH: 31.9 pg (ref 26.0–34.0)
MCH: 31.7 pg (ref 26.0–34.0)
MCHC: 31.6 g/dL (ref 30.0–36.0)
MCHC: 33.5 g/dL (ref 30.0–36.0)
MCV: 101 fL — ABNORMAL HIGH (ref 78.0–100.0)
MCV: 94.7 fL (ref 78.0–100.0)
Platelets: 241 10*3/uL (ref 150–400)
Platelets: 255 10*3/uL (ref 150–400)
RBC: 2.1 MIL/uL — ABNORMAL LOW (ref 4.22–5.81)
RBC: 2.81 MIL/uL — ABNORMAL LOW (ref 4.22–5.81)
RDW: 15.6 % — ABNORMAL HIGH (ref 11.5–15.5)
RDW: 16.9 % — ABNORMAL HIGH (ref 11.5–15.5)
WBC: 6.9 10*3/uL (ref 4.0–10.5)
WBC: 8.8 10*3/uL (ref 4.0–10.5)

## 2018-01-18 LAB — PREPARE RBC (CROSSMATCH)

## 2018-01-18 LAB — IRON AND TIBC
Iron: 28 ug/dL — ABNORMAL LOW (ref 45–182)
Saturation Ratios: 18 % (ref 17.9–39.5)
TIBC: 158 ug/dL — ABNORMAL LOW (ref 250–450)
UIBC: 130 ug/dL

## 2018-01-18 MED ORDER — MIDODRINE HCL 5 MG PO TABS
ORAL_TABLET | ORAL | Status: AC
  Filled 2018-01-18: qty 2

## 2018-01-18 MED ORDER — ALBUMIN HUMAN 25 % IV SOLN
25.0000 g | Freq: Once | INTRAVENOUS | Status: AC
  Administered 2018-01-18: 25 g via INTRAVENOUS

## 2018-01-18 MED ORDER — DIPHENHYDRAMINE HCL 25 MG PO CAPS
ORAL_CAPSULE | ORAL | Status: AC
  Filled 2018-01-18: qty 1

## 2018-01-18 MED ORDER — DOXERCALCIFEROL 4 MCG/2ML IV SOLN
INTRAVENOUS | Status: AC
  Filled 2018-01-18: qty 2

## 2018-01-18 MED ORDER — SODIUM CHLORIDE 0.9% IV SOLUTION
Freq: Once | INTRAVENOUS | Status: AC
  Administered 2018-01-18: 09:00:00 via INTRAVENOUS

## 2018-01-18 MED ORDER — DARBEPOETIN ALFA 60 MCG/0.3ML IJ SOSY
PREFILLED_SYRINGE | INTRAMUSCULAR | Status: AC
  Filled 2018-01-18: qty 0.3

## 2018-01-18 MED ORDER — DARBEPOETIN ALFA 60 MCG/0.3ML IJ SOSY: 60.0000 ug | PREFILLED_SYRINGE | INTRAMUSCULAR | Status: DC

## 2018-01-18 MED ORDER — DIPHENHYDRAMINE HCL 25 MG PO CAPS
25.0000 mg | ORAL_CAPSULE | Freq: Once | ORAL | Status: AC
  Administered 2018-01-18: 25 mg via ORAL

## 2018-01-18 MED ORDER — ACETAMINOPHEN 325 MG PO TABS
ORAL_TABLET | ORAL | Status: AC
  Filled 2018-01-18: qty 2

## 2018-01-18 MED ORDER — SIMETHICONE 80 MG PO CHEW
160.0000 mg | CHEWABLE_TABLET | Freq: Once | ORAL | Status: AC
  Administered 2018-01-18: 160 mg via ORAL
  Filled 2018-01-18: qty 2

## 2018-01-18 MED ORDER — ALBUMIN HUMAN 25 % IV SOLN
INTRAVENOUS | Status: AC
  Filled 2018-01-18: qty 100

## 2018-01-18 MED ORDER — ACETAMINOPHEN 325 MG PO TABS
650.0000 mg | ORAL_TABLET | Freq: Once | ORAL | Status: AC
  Administered 2018-01-18: 650 mg via ORAL

## 2018-01-18 NOTE — Procedures (Signed)
I was present at this dialysis session, have reviewed the session itself and made  appropriate changes.  Requested saline flush q77min to prevent filter clotting.   Jannifer Hick MD [facility] pager [phone]   01/18/2018, 1:10 PM

## 2018-01-18 NOTE — Progress Notes (Signed)
PT Cancellation Note  Patient Details Name: [name] MRN: [id number] DOB: 04-06-1961   Cancelled Treatment:    Reason Eval/Treat Not Completed: Patient at procedure or test/unavailable (HD)  Ellamae Sia, PT, DPT Acute Rehabilitation Services  Pager: [place] 01/18/2018, 2:59 PM

## 2018-01-18 NOTE — Progress Notes (Signed)
Gave orange juice CBG 102 will continue to monitor

## 2018-01-18 NOTE — Progress Notes (Signed)
PROGRESS NOTE  [name] TGG:[id number] DOB: September 12, 1960 DOA: 01/12/2018 PCP: Iona Beard, MD  Brief Summary:  H/o PAF on coumadin, h/o ESRD on HD presented with Spont hemorrhage from left kidney with supertherapeutic INR, s/p vit K, ffp/kcentra Hemorrhagic shock, needing pressor, admitted to ICU, improving , off presser Critical care/IR/urology/nephrology consulted      HPI/Recap of past 24 hours:  Patient is seen at hemodialysis unit, no chest pains no palpitations no abdominal pain no vomiting, hemoglobin down to 6.7, no new complaints  Assessment/Plan: Principal Problem:   Hemorrhage of left kidney Active Problems:   Systolic CHF, chronic (HCC)   ESRD (end stage renal disease) on dialysis ([place])   Hypotension   History of atrial flutter  Hemorrhagic shock, Spontaneous hemorrhage of left kidney in the setting of supratherapeutic INR-  -H/o RCC s/p  nephrectomy on the  right  -INR 4.59 on presentation, coumadin held, he received vit k/ffp, kcentra and 2units of prbc on day of admission, he is s/p IR embolization for left renal hemorrhage -required embolization of entire renal blood flow (left main renal artery)to the left kidney emergently on 8/23, he required pressors for bp supports, he is transferred to ICU after IR embolization on day of admission,  -he is improving, off pressor on 8/25, bp stable on midodrine tid -he is  transferred back to hospitalist service on 8/26 -hgb continue to drop, he continues to have gross hematuria, he continues to have ab and left flank pain -case discussed with IR on call Dr Annamaria Boots on 8/27 who reviewed imaging from 8/23 and states left renal artery embolization on 8/23 was successful, no more IR intervention. Advise to discuss case with urology. -case discussed with urology Dr Matilde Sprang who recommend conservation management for now with prn blood transfusion and pain control, Dr Matilde Sprang will continue following patient. -he has poor IV  access, will keep right femoral central line ( placed by IR on 8/23) in for now -IR/urology  input appreciated  Blood loss anemia with h/o anemia of chronic disease: -hgb baseline above 10, he is on iron based binder at home -He is on aranesp injection per nephrology -S/p 2prbc on day of admission on 8/23 -hgb dropping, hgb 6.7, continue to have small amount of hematuria, prbc x1 unit on 8/27, additional 2 units of packed cells on 01/18/2017, -case discussed with nephrology    Leukocytosis From stress vs infection CXR with consolidation LLL.  procalcitonin 18 Blood culture no growth, mrsa screening negative He is started on cefepime per pharmacy since 8/25, wbc normalized on 8/27, continue cefepime to finish total of 7 days  ESRD on HD MWF -HD MWF at [facility]. On HD since 05/2010.  dialysis through left avf. -had Hyperkalemia k went up to 7.4 on 8/24, treated, k now normalized -HD  And blood transfusion per nephrology, had HD on 01/18/2018 with transfusion of 2 units of packed cells  Paroxysmal atrial flutter with prior history of stroke ( no residual per patient, but with some memory impairment at baseline) -He is currently in sinus rhythm on amiodarone -Has been on Coumadin for 2 years, need to be cleared by urology before restarting back on anticoagulation, he does not wants to be back on coumadin if there is another agent we can choose.  -he reports last  INR in the clinic prior to hospitalization was 5.8 Continue to hold Coumadin  Chronic combined CHF Last lvef 30% Volume per dialysis  Mild Troponin bump in setting  of bleeding Troponins mild and flat, denies chest pain, ekg no acute st/t changes, will not check more  Chronic hypotension, has been on midodrine prn prior to hospitalization Now on scheduled midodrine  Diet controlled diabetes, he was on metformin briefly for two years, has been off diabetic meds since 2013 Has symptomatic  hypoglycemia   Anorexia- He reports feeling nauseous, poor appetite, ( not sure if iron based binder could be the cause of his gi symptom, he does not appear to have long standing diabetes make gastroparesis less likely), nephrology to decide on binder choice.  Trial dose of remeron qhs started on 8/27    Code Status: full  Family Communication: patient Disposition Plan:  TBD   Consultants:  Critical care  IR  Urology  Nephrology  Procedures:  urgent IR angiogram and embolization on 8/23  Right femoral line placement on 8/23 by IR  prbc transfusion on 8/23 and on 8/27  Dialysis MWF   Antibiotics:  Cefepime from 8/25    Objective: BP 108/62 (BP Location: Right Arm)   Pulse 92   Temp 98.2 F (36.8 C) (Oral)   Resp (!) 21   Ht 6\' 1"  (1.854 m)   Wt 118.2 kg   SpO2 100%   BMI 34.38 kg/m   Intake/Output Summary (Last 24 hours) at 01/18/2018 1742 Last data filed at 01/18/2018 1600 Gross per 24 hour  Intake 1148.29 ml  Output 2616 ml  Net -1467.71 ml   Filed Weights   01/16/18 1400 01/16/18 1725 01/18/18 1600  Weight: 118.4 kg 117.1 kg 118.2 kg    Exam: Patient is examined daily including today on 01/18/2018, exams remain the same as of yesterday except that has changed    General:  NAD  Eye-right eye vision loss  Cardiovascular: RRR  Respiratory: CTABL  Abdomen: mild diffuse tender left sided ab, no guarding, no rebound, Soft, positive BS  Musculoskeletal: No Edema ,left upper extremity exam reveals AV fistula with positive thrill and bruit, femoral line is clean dry and intact   neuro: alert, oriented , impaired memory, immediate recall 2/3 ( per family, patient has baseline memory issues)  Data Reviewed: Basic Metabolic Panel: Recent Labs  Lab 01/13/18 1047 01/14/18 0431  01/15/18 0400 01/15/18 2017 01/16/18 0309 01/17/18 0758 01/18/18 1238  NA 134* 132*   < > 135 132* 132* 136 133*  K 5.9* 7.4*   < > 5.3* 5.5* 5.5* 5.1 5.0   CL 93* 92*   < > 94* 92* 93* 98 98  CO2 21* 26   < > 28 25 28 28 25   GLUCOSE 228* 106*   < > 112* 78 88 83 104*  BUN 49* 63*   < > 34* 43* 45* 28* 38*  CREATININE 13.96* 14.46*   < > 10.03* 11.35* 12.01* 8.98* 11.47*  CALCIUM 8.3* 8.2*   < > 8.3* 8.6* 8.3* 8.4* 8.7*  MG  --  2.5*  --  2.1  --   --   --   --   PHOS 7.3* 7.2*  --  5.8*  --   --   --  4.8*   < > = values in this interval not displayed.   Liver Function Tests: Recent Labs  Lab 01/12/18 2159 01/13/18 1047 01/15/18 0400 01/18/18 1238  AST 10*  --  20  --   ALT 8  --  25  --   ALKPHOS 68  --  70  --   BILITOT 0.9  --  1.1  --   PROT 7.8  --  6.3*  --   ALBUMIN 3.1* 2.6* 2.4* 2.1*   Recent Labs  Lab 01/12/18 2159  LIPASE 38   No results for input(s): AMMONIA in the last 168 hours. CBC: Recent Labs  Lab 01/14/18 0607 01/15/18 0400 01/15/18 1630 01/16/18 0309 01/16/18 1925 01/17/18 0534 01/18/18 0524  WBC 20.7* 21.0*  --  15.7*  --  10.0 6.9  NEUTROABS  --  17.5*  --   --   --   --   --   HGB 10.3* 8.0* 7.8* 7.0* ABNORMAL 6.3* 6.7*  HCT 31.3* 24.4* 24.2* 21.6* ABNORMAL 19.6* 21.2*  MCV 96.0 97.6  --  97.7  --  99.0 101.0*  PLT 255 217  --  202  --  243 241   Cardiac Enzymes:   Recent Labs  Lab 01/13/18 0526 01/13/18 1156 01/13/18 2022  TROPONINI 0.27* 0.21* 0.20*   BNP (last 3 results) Recent Labs    07/18/17 0531  BNP 1,325.2*    ProBNP (last 3 results) No results for input(s): PROBNP in the last 8760 hours.  CBG: Recent Labs  Lab 01/18/18 0407 01/18/18 0823 01/18/18 0903 01/18/18 1006 01/18/18 1726  GLUCAP 92 62* 67* 129* 79    Recent Results (from the past 240 hour(s))  MRSA PCR Screening     Status: None   Collection Time: 01/13/18 11:56 AM  Result Value Ref Range Status   MRSA by PCR NEGATIVE NEGATIVE Final    Comment:        The GeneXpert MRSA Assay (FDA approved for NASAL specimens only), is one component of a comprehensive MRSA colonization surveillance program. It  is not intended to diagnose MRSA infection nor to guide or monitor treatment for MRSA infections. Performed at [facility], [place] [address], [place], [place] [address]   Culture, blood (routine x 2)     Status: None (Preliminary result)   Collection Time: 01/14/18 10:54 AM  Result Value Ref Range Status   Specimen Description BLOOD RIGHT HAND  Final   Special Requests   Final    BOTTLES DRAWN AEROBIC ONLY Blood Culture adequate volume   Culture   Final    NO GROWTH 4 DAYS Performed at [facility], [place] [address] [place], [place] [address]    Report Status PENDING  Incomplete  Culture, blood (routine x 2)     Status: None (Preliminary result)   Collection Time: 01/14/18 11:03 AM  Result Value Ref Range Status   Specimen Description BLOOD RIGHT HAND  Final   Special Requests   Final    BOTTLES DRAWN AEROBIC ONLY Blood Culture results may not be optimal due to an inadequate volume of blood received in culture bottles   Culture   Final    NO GROWTH 4 DAYS Performed at [facility], [place] [address], [place],  [address]    Report Status PENDING  Incomplete     Studies: No results found.  Scheduled Meds: . sodium chloride   Intravenous Once  . allopurinol  150 mg Oral QODAY  . amiodarone  100 mg Oral Daily  . Chlorhexidine Gluconate Cloth  6 each Topical Q0600  . darbepoetin (ARANESP) injection - DIALYSIS  60 mcg Intravenous Q Wed-HD  . doxercalciferol  2 mcg Intravenous Q M,W,F-HD  . ferric citrate  210 mg Oral TID WC  . midodrine  10 mg Oral TID WC  . mirtazapine  7.5 mg Oral QHS  . multivitamin  1 tablet Oral QHS  . pantoprazole  40 mg Oral BID  . sodium chloride flush  10-40 mL Intracatheter Q12H    Continuous Infusions: . sodium chloride 250 mL (01/15/18 1102)  . ceFEPime (MAXIPIME) IV 1 g (01/17/18 2159)  . dextrose 10 mL/hr at 01/17/18 1546    Roxan Hockey MD, PhD  [facility] Pager [phone]. If 7PM-7AM, please contact  night-coverage at [email or web address], password [facility] 01/18/2018, 5:42 PM  LOS: 5 days

## 2018-01-18 NOTE — Progress Notes (Addendum)
Subjective:   For hd today / cos tight abdominal area  , eating breakfast   Objective Vital signs in last 24 hours: Vitals:   01/17/18 2045 01/18/18 0013 01/18/18 0045 01/18/18 0408  BP: 110/79  90/60   Pulse: 94  83   Resp: (!) 21  16   Temp: 98.9 F (37.2 C) 98.8 F (37.1 C) 98.9 F (37.2 C) 98.3 F (36.8 C)  TempSrc: Oral Oral Oral Oral  SpO2: 100%  97%   Weight:      Height:       Weight change:   Physical Exam: General:  Obese WDWN male in bed  NAD  Heart: RRR  1/6 sem  No rub or gallop Lungs: CTA , non labored breathing  Abdomen: BS pos .  Obese, Soft  , diffusely  Tender   Extremities:trace  pedal edeam / R hand mild swelling  Dialysis Access: Pos bruit  LUA AVF   [facility]  4h 111.5kg 2/2.25 bath P4 LUE AVF Hep 7000 +3000 midrun Hectorol 25mcg IV TIW Parsabiv 12.5mg  IV TIW  Problem/Plan: 1. L renal hemorrhage - s/p L renal artery embolization by IR 8/23. Abd pain improving. Hb down today to7.0 >6.3 .> 1 unit PRBCs  > 6.7 this am  ( plan for 2 units transfusion on HD today  ) / ?? Needs another  CT to eval   Abd/ Kidney sp Hemorrhage  2. Hypotension -  Lowish bp 90 /60  This am  / now on midodrine at  10 mg  TID //  BY  BED Wt 01/16/18  is   6 kg > edw   /attempt  uf   on hd today   With albumin support /midodirne / uf profile 4  Low temp on hd / getting 2 u PRBCS  3. Hyperkalemia -stable at 5.1 yest / pre hd lab pend /in light of large hematoma will likely remain elevated for a while.  4. ESRD -MWF HD. HD on schedule .Dr. Jonnie Finner spoke w/ urology , recommended continue holding heparin until more stable (Hb and BP's)- will hold . 5. Anemia of CKD/ abl - is sp 2 units prbc's on 8/23, 8/26 =Hb7.0 > hgb 6.3  1 unit 8/27  and 6.7 this am  For 2 u prbcs on hd  today  .Aranesp give 60 mcg q wed hd start 01/18/18 . Check iron stores also  6. Low grade temp - BC  8/24 no growth / wbc 10 this am   7. Metabolic bone disease -Ca ok. Phos  7.3 >5.8  with . Resumed binders when eating. Continue Hectorol.No parsabiv here. fu phos  Lab  8. Aflutter - on amiodarone. Coumadin on hold.  9. Hx RCC s/p right nephrectomy 2002 10. NICM EF 30-35%   Ernest Haber, PA-C [facility] Beeper [phone] 01/18/2018,11:02 AM  LOS: 5 days   Labs: Basic Metabolic Panel: Recent Labs  Lab 01/13/18 1047 01/14/18 0431  01/15/18 0400 01/15/18 2017 01/16/18 0309 01/17/18 0758  NA 134* 132*   < > 135 132* 132* 136  K 5.9* 7.4*   < > 5.3* 5.5* 5.5* 5.1  CL 93* 92*   < > 94* 92* 93* 98  CO2 21* 26   < > 28 25 28 28   GLUCOSE 228* 106*   < > 112* 78 88 83  BUN 49* 63*   < > 34* 43* 45* 28*  CREATININE 13.96* 14.46*   < > 10.03* 11.35*  12.01* 8.98*  CALCIUM 8.3* 8.2*   < > 8.3* 8.6* 8.3* 8.4*  PHOS 7.3* 7.2*  --  5.8*  --   --   --    < > = values in this interval not displayed.   Liver Function Tests: Recent Labs  Lab 01/12/18 2159 01/13/18 1047 01/15/18 0400  AST 10*  --  20  ALT 8  --  25  ALKPHOS 68  --  70  BILITOT 0.9  --  1.1  PROT 7.8  --  6.3*  ALBUMIN 3.1* 2.6* 2.4*   Recent Labs  Lab 01/12/18 2159  LIPASE 38   No results for input(s): AMMONIA in the last 168 hours. CBC: Recent Labs  Lab 01/14/18 0607 01/15/18 0400  01/16/18 0309 01/16/18 1925 01/17/18 0534 01/18/18 0524  WBC 20.7* 21.0*  --  15.7*  --  10.0 6.9  NEUTROABS  --  17.5*  --   --   --   --   --   HGB 10.3* 8.0*   < > 7.0* ABNORMAL 6.3* 6.7*  HCT 31.3* 24.4*   < > 21.6* ABNORMAL 19.6* 21.2*  MCV 96.0 97.6  --  97.7  --  99.0 101.0*  PLT 255 217  --  202  --  243 241   < > = values in this interval not displayed.   Cardiac Enzymes: Recent Labs  Lab 01/13/18 0526 01/13/18 1156 01/13/18 2022  TROPONINI 0.27* 0.21* 0.20*   CBG: Recent Labs  Lab 01/18/18 0135 01/18/18 0407 01/18/18 0823 01/18/18 0903 01/18/18 1006  GLUCAP 102* 92 62* 67* 129*    Studies/Results: No results found. Medications: . sodium chloride 250 mL  (01/15/18 1102)  . ceFEPime (MAXIPIME) IV 1 g (01/17/18 2159)  . dextrose 10 mL/hr at 01/17/18 1546   . sodium chloride   Intravenous Once  . acetaminophen  650 mg Oral Once in dialysis  . allopurinol  150 mg Oral QODAY  . amiodarone  100 mg Oral Daily  . Chlorhexidine Gluconate Cloth  6 each Topical Q0600  . darbepoetin (ARANESP) injection - DIALYSIS  60 mcg Intravenous Q Wed-HD  . diphenhydrAMINE  25 mg Oral Once in dialysis  . doxercalciferol  2 mcg Intravenous Q M,W,F-HD  . ferric citrate  210 mg Oral TID WC  . midodrine  10 mg Oral TID WC  . mirtazapine  7.5 mg Oral QHS  . multivitamin  1 tablet Oral QHS  . pantoprazole  40 mg Oral BID  . sodium chloride flush  10-40 mL Intracatheter Q12H

## 2018-01-18 NOTE — Progress Notes (Signed)
IV TEAM: Received consult to draw labs from R femoral line. Waste obtained from brown port of triple lumen cath, unable to draw labs after waste. Scant amount of blood return from the other 2 lumen after several position changes. Unable to draw labs. Lowella Dandy, RN notified to contact phlebotomy for lab draw.

## 2018-01-18 NOTE — Progress Notes (Signed)
Patient not in room Dialysis today- one unit yesterday and today with dialysis NO pressors  Systolic 59-977 systolic

## 2018-01-18 NOTE — Progress Notes (Signed)
Review chart  Spoke to my partners as well.  Patient continuing to have low hemoglobin and was  Transfuse 2 units yesterday.  It appears a repeat x-ray has been ordered.  Urology recommends to continue to provide the patient's supportive care.  The patient should stay off all blood thinners.  It would be very rare for the bleeding not to normalize.  Surgery under these conditions would  Be associated with high morbidity and not recommended at this current stage.  I may speak to interventional Radiology recognizing their last opinion if needed.  Will continue to follow

## 2018-01-19 LAB — CULTURE, BLOOD (ROUTINE X 2)
Culture: NO GROWTH
Culture: NO GROWTH
Special Requests: ADEQUATE

## 2018-01-19 LAB — TYPE AND SCREEN
ABO/RH(D): A POS
Antibody Screen: NEGATIVE
Unit division: 0
Unit division: 0
Unit division: 0

## 2018-01-19 LAB — BPAM RBC
Blood Product Expiration Date: 201908312359
Blood Product Expiration Date: 201908312359
Blood Product Expiration Date: 201909012359
ISSUE DATE / TIME: 201908271209
ISSUE DATE / TIME: 201908281355
ISSUE DATE / TIME: 201908281355
Unit Type and Rh: 6200
Unit Type and Rh: 6200
Unit Type and Rh: 6200

## 2018-01-19 LAB — BASIC METABOLIC PANEL
Anion gap: 10 (ref 5–15)
BUN: 26 mg/dL — ABNORMAL HIGH (ref 6–20)
CO2: 27 mmol/L (ref 22–32)
Calcium: 8.6 mg/dL — ABNORMAL LOW (ref 8.9–10.3)
Chloride: 97 mmol/L — ABNORMAL LOW (ref 98–111)
Creatinine, Ser: 8.48 mg/dL — ABNORMAL HIGH (ref 0.61–1.24)
GFR calc Af Amer: 7 mL/min — ABNORMAL LOW (ref >60)
GFR calc non Af Amer: 6 mL/min — ABNORMAL LOW (ref >60)
Glucose, Bld: 114 mg/dL — ABNORMAL HIGH (ref 70–99)
Potassium: 4.3 mmol/L (ref 3.5–5.1)
Sodium: 134 mmol/L — ABNORMAL LOW (ref 135–145)

## 2018-01-19 LAB — GLUCOSE, CAPILLARY
Glucose-Capillary: 70 mg/dL (ref 70–99)
Glucose-Capillary: 73 mg/dL (ref 70–99)
Glucose-Capillary: 79 mg/dL (ref 70–99)
Glucose-Capillary: 83 mg/dL (ref 70–99)
Glucose-Capillary: 90 mg/dL (ref 70–99)
Glucose-Capillary: 93 mg/dL (ref 70–99)

## 2018-01-19 LAB — CBC
HCT: 24.4 % — ABNORMAL LOW (ref 39.0–52.0)
Hemoglobin: 7.9 g/dL — ABNORMAL LOW (ref 13.0–17.0)
MCH: 31.6 pg (ref 26.0–34.0)
MCHC: 32.4 g/dL (ref 30.0–36.0)
MCV: 97.6 fL (ref 78.0–100.0)
Platelets: 242 10*3/uL (ref 150–400)
RBC: 2.5 MIL/uL — ABNORMAL LOW (ref 4.22–5.81)
RDW: 17.2 % — ABNORMAL HIGH (ref 11.5–15.5)
WBC: 8.1 10*3/uL (ref 4.0–10.5)

## 2018-01-19 MED ORDER — MIDODRINE HCL 5 MG PO TABS
ORAL_TABLET | ORAL | Status: AC
  Administered 2018-01-20: 10 mg via ORAL
  Filled 2018-01-19: qty 2

## 2018-01-19 MED ORDER — CHLORHEXIDINE GLUCONATE CLOTH 2 % EX PADS
6.0000 | MEDICATED_PAD | Freq: Every day | CUTANEOUS | Status: DC
  Administered 2018-01-19: 6 via TOPICAL

## 2018-01-19 MED ORDER — ALBUMIN HUMAN 25 % IV SOLN
25.0000 g | Freq: Once | INTRAVENOUS | Status: AC
  Administered 2018-01-19: 25 g via INTRAVENOUS

## 2018-01-19 MED ORDER — ALBUMIN HUMAN 25 % IV SOLN
INTRAVENOUS | Status: AC
  Filled 2018-01-19: qty 100

## 2018-01-19 NOTE — Progress Notes (Signed)
Reviewed chart and spoke with nurse Hb pending tomorrow Spoke with pt and family Conservative management will follow

## 2018-01-19 NOTE — Progress Notes (Signed)
PT Cancellation Note  Patient Details Name: [name] MRN: [id number] DOB: June 28, 1960   Cancelled Treatment:    Reason Eval/Treat Not Completed: Medical issues which prohibited therapy; had HD extra session today, still with tunneled femoral catheter.  Mixed reply from staff about if able to get up with catheter.  Will attempt one more day to clarify with MD about OOB.     Reginia Naas 01/19/2018, 4:27 PM  Magda Kiel, [place] 01/19/2018

## 2018-01-19 NOTE — Progress Notes (Signed)
OT Cancellation Note    01/19/18 1000  OT Visit Information  Last OT Received On 01/19/18  Reason Eval/Treat Not Completed Patient at procedure or test/ unavailable (HD)  Maurie Boettcher, OT/L  OT Clinical Specialist [phone]

## 2018-01-19 NOTE — Progress Notes (Signed)
HD completed. Total UF 2.5L. Unable to meet goal due to hypotension and system clotting x1 despite midodrine/albumin administration and flushing q 30-45 minutes respectively. Patient currently without complaints. Report called to primary RN.

## 2018-01-19 NOTE — Progress Notes (Signed)
HD initiated via L AVF using 15g needles x2 without issue. Midodrine given as well. No heparin treatment. Patient currently in no distress. Denies pain. All vitals stable. UF goal 3L as ordered.

## 2018-01-19 NOTE — Progress Notes (Signed)
PROGRESS NOTE  [name] RUE:[id number] DOB: 06/01/1960 DOA: 01/12/2018 PCP: Iona Beard, MD  Brief Summary:  H/o PAF on coumadin, h/o ESRD on HD presented with Spont hemorrhage from left kidney with supertherapeutic INR, s/p vit K, ffp/kcentra Hemorrhagic shock, needing pressor, admitted to ICU, improving , off presser Critical care/IR/urology/nephrology consulted      HPI/Recap of past 24 hours: Sister at bedside, no fevers, no nausea or vomiting, denies any obvious bleeding, no chest pains hemoglobin trending down again  Assessment/Plan: Principal Problem:   Hemorrhage of left kidney Active Problems:   Systolic CHF, chronic (HCC)   ESRD (end stage renal disease) on dialysis (HCC)   Hypotension   History of atrial flutter  Hemorrhagic shock, Spontaneous hemorrhage of left kidney in the setting of supratherapeutic INR-  -H/o RCC s/p  nephrectomy on the  right  -INR 4.59 on presentation, coumadin held, he received vit k/ffp, kcentra and 2units of prbc on day of admission, he is s/p IR embolization for left renal hemorrhage -required embolization of entire renal blood flow (left main renal artery)to the left kidney emergently on 8/23, he required pressors for bp supports, he is transferred to ICU after IR embolization on day of admission,  -he is improving, off pressor on 8/25, bp stable on midodrine tid -he is  transferred back to hospitalist service on 8/26 -hgb continue to drop, he continues to have gross hematuria, he continues to have ab and left flank pain -case discussed with IR on call Dr Annamaria Boots on 8/27 who reviewed imaging from 8/23 and states left renal artery embolization on 8/23 was successful, no more IR intervention. Advise to discuss case with urology. -case discussed with urology Dr Matilde Sprang who recommend conservation management for now with prn blood transfusion and pain control, Dr Matilde Sprang will continue following patient. -he has poor IV access, will keep  right femoral central line ( placed by IR on 8/23) in for now -IR/urology  input appreciated   Blood loss anemia with h/o anemia of chronic disease: -hgb baseline above 10, he is on iron based binder at home, -He is on aranesp injection per nephrology -S/p 2prbc on day of admission on 8/23 -hgb dropping, hgb 6.7, continue to have small amount of hematuria, prbc x1 unit on 8/27, additional 2 units of packed cells on 01/18/2017, hemoglobin now down to 7.9 from 8.9 -case discussed with nephrology, watch for bleeding consider transfusion in a.m. with hemodialysis     Leukocytosis From stress vs infection CXR with consolidation LLL.  procalcitonin 18 Blood culture no growth, mrsa screening negative He is started on cefepime per pharmacy since 8/25, wbc normalized on 8/27, continue cefepime to finish total of 7 days  ESRD on HD MWF -HD MWF at [facility]. On HD since 05/2010.  dialysis through left avf.  Last HD 01/19/2018  Paroxysmal atrial flutter with prior history of stroke ( no residual per patient, but with some memory impairment at baseline) -He is currently in sinus rhythm on amiodarone -Has been on Coumadin for 2 years, need to be cleared by urology before restarting back on anticoagulation, he does not wants to be back on coumadin if there is another agent we can choose.  -he reports last  INR in the clinic prior to hospitalization was 5.8 Continue to hold Coumadin  HFrEF--- patient appears to have had acute on chronic chronic combined diastolic and systolic dysfunction CHF exacerbation with volume overload, last known EF 30%, currently addressing volume status with  hemodialysis   Chronic Hypotension, has been on midodrine prn prior to hospitalization Now on scheduled midodrine  Diet controlled diabetes, he was on metformin briefly for two years, has been off diabetic meds since 2013, avoid over aggressive diabetic control due to history of symptomatic hypoglycemia,      Anorexia-slightly improved, continue Remeron  nephrology to decide on binder choice.    Dispo--- unable to discharge due to dropping hemoglobin, patient may need further transfusion, may need repeat abdominal imaging to rule out further bleeding, patient also needs further hemodialysis to address volume status   Code Status: full  Family Communication: patient Disposition Plan:  TBD   Consultants:  Critical care  IR  Urology  Nephrology  Procedures:  urgent IR angiogram and embolization on 8/23  Right femoral line placement on 8/23 by IR  prbc transfusion on 8/23 and on 8/27  Dialysis MWF   Antibiotics:  Cefepime from 8/25    Objective: BP 127/67   Pulse 88   Temp 97.9 F (36.6 C) (Oral)   Resp 16   Ht 6\' 1"  (1.854 m)   Wt 113.3 kg   SpO2 99%   BMI 32.95 kg/m   Intake/Output Summary (Last 24 hours) at 01/19/2018 1826 Last data filed at 01/19/2018 1112 Gross per 24 hour  Intake -  Output 2513 ml  Net -2513 ml   Filed Weights   01/18/18 1600 01/19/18 0700 01/19/18 1112  Weight: 118.2 kg 116 kg 113.3 kg    Exam: Patient is examined daily including today on 01/19/2018, exams remain the same as of yesterday except that has changed    Physical Exam  Gen:- Awake Alert, in no acute distress HEENT:- [place].AT, No sclera icterus Neck-Supple Neck,No JVD,.  Lungs-diminished in bases, faint bibasilar crackles CV- S1, S2 normal Abd-  +ve B.Sounds, Abd Soft, No tenderness,    Extremity/Skin:- Left upper extremity exam reveals AV fistula with positive thrill and bruit, femoral line is clean dry and intac Psych-affect is appropriate, oriented x3, forgetful at times Neuro-no new focal deficits, no tremors   Data Reviewed: Basic Metabolic Panel: Recent Labs  Lab 01/13/18 1047 01/14/18 0431  01/15/18 0400 01/15/18 2017 01/16/18 0309 01/17/18 0758 01/18/18 1238 01/19/18 0700  NA 134* 132*   < > 135 132* 132* 136 133* 134*  K 5.9* 7.4*   <  > 5.3* 5.5* 5.5* 5.1 5.0 4.3  CL 93* 92*   < > 94* 92* 93* 98 98 97*  CO2 21* 26   < > 28 25 28 28 25 27   GLUCOSE 228* 106*   < > 112* 78 88 83 104* 114*  BUN 49* 63*   < > 34* 43* 45* 28* 38* 26*  CREATININE 13.96* 14.46*   < > 10.03* 11.35* 12.01* 8.98* 11.47* 8.48*  CALCIUM 8.3* 8.2*   < > 8.3* 8.6* 8.3* 8.4* 8.7* 8.6*  MG  --  2.5*  --  2.1  --   --   --   --   --   PHOS 7.3* 7.2*  --  5.8*  --   --   --  4.8*  --    < > = values in this interval not displayed.   Liver Function Tests: Recent Labs  Lab 01/12/18 2159 01/13/18 1047 01/15/18 0400 01/18/18 1238  AST 10*  --  20  --   ALT 8  --  25  --   ALKPHOS 68  --  70  --  BILITOT 0.9  --  1.1  --   PROT 7.8  --  6.3*  --   ALBUMIN 3.1* 2.6* 2.4* 2.1*   Recent Labs  Lab 01/12/18 2159  LIPASE 38   No results for input(s): AMMONIA in the last 168 hours. CBC: Recent Labs  Lab 01/15/18 0400  01/16/18 0309 01/16/18 1925 01/17/18 0534 01/18/18 0524 01/18/18 2014 01/19/18 0435  WBC 21.0*  --  15.7*  --  10.0 6.9 8.8 8.1  NEUTROABS 17.5*  --   --   --   --   --   --   --   HGB 8.0*   < > 7.0* ABNORMAL 6.3* 6.7* 8.9* 7.9*  HCT 24.4*   < > 21.6* ABNORMAL 19.6* 21.2* 26.6* 24.4*  MCV 97.6  --  97.7  --  99.0 101.0* 94.7 97.6  PLT 217  --  202  --  243 241 255 242   < > = values in this interval not displayed.   Cardiac Enzymes:   Recent Labs  Lab 01/13/18 0526 01/13/18 1156 01/13/18 2022  TROPONINI 0.27* 0.21* 0.20*   BNP (last 3 results) Recent Labs    07/18/17 0531  BNP 1,325.2*    ProBNP (last 3 results) No results for input(s): PROBNP in the last 8760 hours.  CBG: Recent Labs  Lab 01/18/18 2128 01/19/18 0026 01/19/18 0502 01/19/18 1241 01/19/18 1653  GLUCAP 80 83 79 90 73    Recent Results (from the past 240 hour(s))  MRSA PCR Screening     Status: None   Collection Time: 01/13/18 11:56 AM  Result Value Ref Range Status   MRSA by PCR NEGATIVE NEGATIVE Final    Comment:        The  GeneXpert MRSA Assay (FDA approved for NASAL specimens only), is one component of a comprehensive MRSA colonization surveillance program. It is not intended to diagnose MRSA infection nor to guide or monitor treatment for MRSA infections. Performed at [facility], [place] [address] [place], [place] [address]   Culture, blood (routine x 2)     Status: None   Collection Time: 01/14/18 10:54 AM  Result Value Ref Range Status   Specimen Description BLOOD RIGHT HAND  Final   Special Requests   Final    BOTTLES DRAWN AEROBIC ONLY Blood Culture adequate volume   Culture   Final    NO GROWTH 5 DAYS Performed at [facility], [place] [address] [place], [place] [address]    Report Status 01/19/2018 FINAL  Final  Culture, blood (routine x 2)     Status: None   Collection Time: 01/14/18 11:03 AM  Result Value Ref Range Status   Specimen Description BLOOD RIGHT HAND  Final   Special Requests   Final    BOTTLES DRAWN AEROBIC ONLY Blood Culture results may not be optimal due to an inadequate volume of blood received in culture bottles   Culture   Final    NO GROWTH 5 DAYS Performed at [facility], [place] [address] [place], [place] [address]    Report Status 01/19/2018 FINAL  Final     Studies: No results found.  Scheduled Meds: . allopurinol  150 mg Oral QODAY  . amiodarone  100 mg Oral Daily  . Chlorhexidine Gluconate Cloth  6 each Topical Q0600  . Chlorhexidine Gluconate Cloth  6 each Topical Q0600  . darbepoetin (ARANESP) injection - DIALYSIS  60 mcg Intravenous Q Wed-HD  .  doxercalciferol  2 mcg Intravenous Q M,W,F-HD  . ferric citrate  210 mg Oral TID WC  . midodrine  10 mg Oral TID WC  . mirtazapine  7.5 mg Oral QHS  . multivitamin  1 tablet Oral QHS  . pantoprazole  40 mg Oral BID  . sodium chloride flush  10-40 mL Intracatheter Q12H    Continuous Infusions: . sodium chloride 250 mL (01/15/18 1102)  . ceFEPime (MAXIPIME) IV 1 g (01/18/18 2235)  .  dextrose 10 mL/hr at 01/17/18 1546    Roxan Hockey MD, PhD  [facility] Pager [phone]. If 7PM-7AM, please contact night-coverage at [email or web address], password [facility] 01/19/2018, 6:26 PM  LOS: 6 days

## 2018-01-19 NOTE — Procedures (Signed)
I was present at this dialysis session, have reviewed the session itself and made  appropriate changes Jannifer Hick MD [place] pager [phone]   01/19/2018, 7:57 AM

## 2018-01-19 NOTE — Progress Notes (Signed)
Subjective:   HD currently; feeling a bit better.  No fevers, chills.  Did well with HD yesterday, extra treatment today for extra UF, then again tomorrow.   Objective Vital signs in last 24 hours: Vitals:   01/19/18 0509 01/19/18 0700 01/19/18 0704 01/19/18 0730  BP: (!) 120/94 122/76 118/70 101/70  Pulse: 93 94 92 89  Resp: 19 18 19 20   Temp: 98.3 F (36.8 C) (!) 97.4 F (36.3 C)    TempSrc: Oral Oral    SpO2: 100% 100%    Weight:  116 kg    Height:       Weight change:   Physical Exam: General:  Obese WDWN male in bed  NAD  Heart: RRR  1/6 sem  No rub or gallop Lungs: CTA , non labored breathing  Abdomen: BS pos .  Obese, Soft  , diffusely  Tender   Extremities:trace  pedal edeam / R hand mild swelling  Dialysis Access: Pos bruit  LUA AVF   [facility]  4h 111.5kg 2/2.25 bath P4 LUE AVF Hep 7000 +3000 midrun Hectorol 67mcg IV TIW Parsabiv 12.5mg  IV TIW  Problem/Plan: 1. L renal hemorrhage - s/p L renal artery embolization by IR 8/23. Abd pain improving tpdau. Hb up to 8.9 after 2u pRBC yesterday, into 7s this AM but question if this could be dilutional.  By symptoms of improving abd pain doesn't seem to be bleeding still.  CTM.   2. Hypotension -  On midodrine at  10 mg  TID, BP currently reasonable and did well with tx yesterday.  EDW 111.5 and pre wt today 116kg.   Profile 4 with HD today.  3. Hyperkalemia -stable at 5.0 yest /in light of large hematoma will likely remain elevated for a while.  4. ESRD -MWF HD. Extra treatment today for fluid removal .Dr. Jonnie Finner spoke w/ urology , recommended continue holding heparin until more stable (Hb and BP's)- will hold . 5. Anemia of CKD/ abl - is sp 2 units prbc's on 8/23, 8/26 =Hb7.0 > hgb 6.3  1 unit 8/27  and 6.7 this am >2 u prbcs on hd  Yesterday > 8.9 > 7.9 .Aranesp give 60 mcg q wed hd start 01/18/18 . Iron sat 18% but in light of hematoma and multiple pRBC will hold on IV iron.  6. Low grade temp -  resolved  7. Metabolic bone disease -Ca ok. Phos  7.3 >5.8>4.8. Resumed binders when eating. Continue Hectorol.No parsabiv here.  8. Aflutter - on amiodarone. Coumadin on hold.  9. Hx RCC s/p right nephrectomy 2002 10. NICM EF 30-35% working towards euvolemia  Jannifer Hick MD [facility] Beeper [phone] 01/19/2018,7:43 AM  LOS: 6 days   Labs: Basic Metabolic Panel: Recent Labs  Lab 01/14/18 0431  01/15/18 0400  01/16/18 0309 01/17/18 0758 01/18/18 1238  NA 132*   < > 135   < > 132* 136 133*  K 7.4*   < > 5.3*   < > 5.5* 5.1 5.0  CL 92*   < > 94*   < > 93* 98 98  CO2 26   < > 28   < > 28 28 25   GLUCOSE 106*   < > 112*   < > 88 83 104*  BUN 63*   < > 34*   < > 45* 28* 38*  CREATININE 14.46*   < > 10.03*   < > 12.01* 8.98* 11.47*  CALCIUM 8.2*   < >  8.3*   < > 8.3* 8.4* 8.7*  PHOS 7.2*  --  5.8*  --   --   --  4.8*   < > = values in this interval not displayed.   Liver Function Tests: Recent Labs  Lab 01/12/18 2159 01/13/18 1047 01/15/18 0400 01/18/18 1238  AST 10*  --  20  --   ALT 8  --  25  --   ALKPHOS 68  --  70  --   BILITOT 0.9  --  1.1  --   PROT 7.8  --  6.3*  --   ALBUMIN 3.1* 2.6* 2.4* 2.1*   Recent Labs  Lab 01/12/18 2159  LIPASE 38   No results for input(s): AMMONIA in the last 168 hours. CBC: Recent Labs  Lab 01/15/18 0400  01/16/18 0309  01/17/18 0534 01/18/18 0524 01/18/18 2014 01/19/18 0435  WBC 21.0*  --  15.7*  --  10.0 6.9 8.8 8.1  NEUTROABS 17.5*  --   --   --   --   --   --   --   HGB 8.0*   < > 7.0*   < > 6.3* 6.7* 8.9* 7.9*  HCT 24.4*   < > 21.6*   < > 19.6* 21.2* 26.6* 24.4*  MCV 97.6  --  97.7  --  99.0 101.0* 94.7 97.6  PLT 217  --  202  --  243 241 255 242   < > = values in this interval not displayed.   Cardiac Enzymes: Recent Labs  Lab 01/13/18 0526 01/13/18 1156 01/13/18 2022  TROPONINI 0.27* 0.21* 0.20*   CBG: Recent Labs  Lab 01/18/18 1006 01/18/18 1726 01/18/18 2128 01/19/18 0026  01/19/18 0502  GLUCAP 129* 79 80 83 79    Studies/Results: No results found. Medications: . sodium chloride 250 mL (01/15/18 1102)  . ceFEPime (MAXIPIME) IV 1 g (01/18/18 2235)  . dextrose 10 mL/hr at 01/17/18 1546   . allopurinol  150 mg Oral QODAY  . amiodarone  100 mg Oral Daily  . Chlorhexidine Gluconate Cloth  6 each Topical Q0600  . darbepoetin (ARANESP) injection - DIALYSIS  60 mcg Intravenous Q Wed-HD  . doxercalciferol  2 mcg Intravenous Q M,W,F-HD  . ferric citrate  210 mg Oral TID WC  . midodrine  10 mg Oral TID WC  . mirtazapine  7.5 mg Oral QHS  . multivitamin  1 tablet Oral QHS  . pantoprazole  40 mg Oral BID  . sodium chloride flush  10-40 mL Intracatheter Q12H

## 2018-01-20 LAB — RENAL FUNCTION PANEL
Albumin: 2.6 g/dL — ABNORMAL LOW (ref 3.5–5.0)
Anion gap: 11 (ref 5–15)
BUN: 22 mg/dL — ABNORMAL HIGH (ref 6–20)
CO2: 28 mmol/L (ref 22–32)
Calcium: 9.1 mg/dL (ref 8.9–10.3)
Chloride: 95 mmol/L — ABNORMAL LOW (ref 98–111)
Creatinine, Ser: 6.67 mg/dL — ABNORMAL HIGH (ref 0.61–1.24)
GFR calc Af Amer: 10 mL/min — ABNORMAL LOW (ref >60)
GFR calc non Af Amer: 8 mL/min — ABNORMAL LOW (ref >60)
Glucose, Bld: 120 mg/dL — ABNORMAL HIGH (ref 70–99)
Phosphorus: 3.4 mg/dL (ref 2.5–4.6)
Potassium: 4 mmol/L (ref 3.5–5.1)
Sodium: 134 mmol/L — ABNORMAL LOW (ref 135–145)

## 2018-01-20 LAB — CBC
HCT: 27.4 % — ABNORMAL LOW (ref 39.0–52.0)
Hemoglobin: 8.8 g/dL — ABNORMAL LOW (ref 13.0–17.0)
MCH: 31.4 pg (ref 26.0–34.0)
MCHC: 32.1 g/dL (ref 30.0–36.0)
MCV: 97.9 fL (ref 78.0–100.0)
Platelets: 266 10*3/uL (ref 150–400)
RBC: 2.8 MIL/uL — ABNORMAL LOW (ref 4.22–5.81)
RDW: 16.6 % — ABNORMAL HIGH (ref 11.5–15.5)
WBC: 9.9 10*3/uL (ref 4.0–10.5)

## 2018-01-20 LAB — GLUCOSE, CAPILLARY
Glucose-Capillary: 107 mg/dL — ABNORMAL HIGH (ref 70–99)
Glucose-Capillary: 66 mg/dL — ABNORMAL LOW (ref 70–99)
Glucose-Capillary: 74 mg/dL (ref 70–99)
Glucose-Capillary: 79 mg/dL (ref 70–99)
Glucose-Capillary: 82 mg/dL (ref 70–99)
Glucose-Capillary: 95 mg/dL (ref 70–99)

## 2018-01-20 MED ORDER — CHLORHEXIDINE GLUCONATE CLOTH 2 % EX PADS
6.0000 | MEDICATED_PAD | Freq: Every day | CUTANEOUS | Status: DC
  Administered 2018-01-20: 6 via TOPICAL

## 2018-01-20 MED ORDER — MIDODRINE HCL 5 MG PO TABS
ORAL_TABLET | ORAL | Status: AC
  Administered 2018-01-20: 10 mg via ORAL
  Filled 2018-01-20: qty 2

## 2018-01-20 MED ORDER — DOXERCALCIFEROL 4 MCG/2ML IV SOLN
INTRAVENOUS | Status: AC
  Administered 2018-01-20: 2 ug via INTRAVENOUS
  Filled 2018-01-20: qty 2

## 2018-01-20 MED ORDER — OXYBUTYNIN CHLORIDE 5 MG PO TABS
5.0000 mg | ORAL_TABLET | Freq: Three times a day (TID) | ORAL | Status: DC
  Administered 2018-01-20 – 2018-01-23 (×8): 5 mg via ORAL
  Filled 2018-01-20 (×8): qty 1

## 2018-01-20 NOTE — Progress Notes (Signed)
Reviewed hemoglobin today; very promising Will continue to follow with conservative therapy

## 2018-01-20 NOTE — Care Management Note (Signed)
Case Management Note  Patient Details  Name: Tywaun Hiltner MRN: [id number] Date of Birth: [birth year]  Subjective/Objective:             Admitted w hematuria.       Action/Plan:  Spoke w patient at bedisde. He states he in from home where he lives with his wife and two dogs.  He describes himself as independent, denies any burdens prior to admission with self care, transportation, or medications. Follows outpatient HD MWF.   PT OT evals pending. CM will continue to follow.   Expected Discharge Date:                  Expected Discharge Plan:     In-House Referral:     Discharge planning Services  CM Consult  Post Acute Care Choice:    Choice offered to:     DME Arranged:    DME Agency:     HH Arranged:    HH Agency:     Status of Service:  In process, will continue to follow  If discussed at Long Length of Stay Meetings, dates discussed:    Additional Comments:  Carles Collet, RN 01/20/2018, 3:47 PM

## 2018-01-20 NOTE — Progress Notes (Signed)
OT Cancellation    01/20/18 0900  OT Visit Information  Last OT Received On 01/20/18  Reason Eval/Treat Not Completed Patient at procedure or test/ unavailable (HD)  Maurie Boettcher, OT/L  OT Clinical Specialist [phone]

## 2018-01-20 NOTE — Progress Notes (Signed)
PROGRESS NOTE  [name] OAC:[id number] DOB: 12-Jun-1960 DOA: 01/12/2018 PCP: Iona Beard, MD  Brief Summary:  H/o PAF on coumadin, h/o ESRD on HD presented with Spont hemorrhage from left kidney with supertherapeutic INR, s/p vit K, ffp/kcentra Hemorrhagic shock, needing pressor, admitted to ICU, improving , off pressors Critical care/IR/urology/nephrology consulted,       HPI/Recap of past 24 hours: Tolerated hemodialysis on 8 3219, no fevers no chest pains  Assessment/Plan: Principal Problem:   Hemorrhage of left kidney Active Problems:   Systolic CHF, chronic (HCC)   ESRD (end stage renal disease) on dialysis (HCC)   Hypotension   History of atrial flutter  1)Hemorrhagic shock, Spontaneous hemorrhage of left kidney in the setting of supratherapeutic INR-  -H/o RCC s/p  nephrectomy on the  right in 2002 -INR 4.59 on presentation, coumadin held, he received vit k/ffp, kcentra and 2units of prbc on day of admission, he is s/p IR embolization for left renal hemorrhage -required embolization of entire renal blood flow (left main renal artery)to the left kidney emergently on 8/23, he required pressors for bp supports, he is transferred to ICU after IR embolization on day of admission,  -he is improving, off pressor on 8/25, bp stable on midodrine tid -he is  transferred back to hospitalist service on 8/26 -case discussed with IR on call Dr Annamaria Boots on 8/27 who reviewed imaging from 8/23 and states left renal artery embolization on 8/23 was successful, no more IR intervention. Advise to discuss case with urology. -case discussed with urology Dr Matilde Sprang who recommend conservation management for now with prn blood transfusion and pain control, Dr Matilde Sprang will continue following patient.  2)Blood loss anemia with h/o anemia of chronic disease: -hgb baseline above 10, he is on iron based binder at home, -He is on aranesp injection per nephrology -S/p 2prbc on day of admission on  8/23, prbc x1 unit on 8/27, additional 2 units of packed cells on 01/18/2017, hemoglobin now 8.8,      3)Leukocytosis- From stress vs infection, CXR with consolidation LLL.  procalcitonin 18, Blood culture no growth, mrsa screening negative He is started on cefepime per pharmacy since 8/25, wbc normalized on 8/27, continue cefepime to finish total of 7 days   4)ESRD on HD MWF- -remains volume overloaded, HD MWF at [facility]. On HD since 05/2010.  dialysis through left avf, had extra hemodialysis treatment on 01/19/2018 to address volume overload status, last HD 01/20/2018  5)Paroxysmal atrial flutter with prior history of stroke ( no residual per patient, but with some memory impairment at baseline)--- CHA2DS2- VASc score   is = 5 (CVA, HTN, CHF, DM)    Which is  equal to = 7.2  % annual. risk of stroke , -he reports last  INR in the clinic prior to hospitalization was 5.8, Continue to hold Coumadin, if anticoagulation needed may consider Eliquis. Has been on Coumadin for 2 years, need to be cleared by urology before restarting back on anticoagulation, he does not wants to be back on coumadin if there is another agent we can choose.  He is currently in sinus rhythm on amiodarone - 6)HFrEF/NICM--- patient appears to have had acute on chronic chronic combined diastolic and systolic dysfunction CHF exacerbation with volume overload, last known EF 30%, remains volume overloaded, currently addressing volume status with hemodialysis   7)Chronic Hypotension, has been on midodrine prn prior to hospitalization Now on scheduled midodrine  8)Diet controlled diabetes, he was on metformin briefly for  two years, has been off diabetic meds since 2013, avoid over aggressive diabetic control due to history of symptomatic hypoglycemia,   9)Anorexia-slightly improved, continue Remeron  nephrology to decide on binder choice.    Disposition/Need for in-Hospital Stay- patient unable to be discharged  at this time due to volume overload status requiring additional hemodialysis sessions, nephrology service plans possible hemodialysis with ultrafiltration on 01/21/2018, concerns about anemia and may require further transfusion, H&H noted today,   Code Status: full  Family Communication: patient Disposition Plan:  ??? Home with Home health   Consultants:  Critical care  IR  Urology  Nephrology  Procedures:  urgent IR angiogram and embolization on 8/23  Right femoral line placement on 8/23 by IR, removed 01/20/18  prbc transfusion on 8/23 and on 8/27  Dialysis MWF   Antibiotics:  Cefepime from 8/25    Objective: BP 113/65 (BP Location: Right Arm)   Pulse 79   Temp 97.6 F (36.4 C) (Oral)   Resp 16   Ht 6\' 1"  (1.854 m)   Wt 114.1 kg   SpO2 100%   BMI 33.19 kg/m   Intake/Output Summary (Last 24 hours) at 01/20/2018 1638 Last data filed at 01/20/2018 1120 Gross per 24 hour  Intake -  Output 3500 ml  Net -3500 ml   Filed Weights   01/19/18 1112 01/20/18 0710 01/20/18 1120  Weight: 113.3 kg 117.2 kg 114.1 kg    Exam: Patient is examined daily including today on 01/20/2018, exams remain the same as of yesterday except that has changed    Physical Exam  Gen:- Awake Alert, in no acute distress HEENT:- [place].AT, No sclera icterus Neck-Supple Neck, +ve JVD,.  Lungs-improving  air movement, bibasilar Rales noted CV- S1, S2 normal Abd-  +ve B.Sounds, Abd Soft, No tenderness,    Extremity/Skin:- Left upper extremity exam reveals AV fistula with positive thrill and bruit, right femoral line to be removed on 01/20/18 Psych-affect is appropriate, oriented x3, forgetful at times Neuro-no new focal deficits, no tremors   Data Reviewed: Basic Metabolic Panel: Recent Labs  Lab 01/14/18 0431  01/15/18 0400  01/16/18 0309 01/17/18 0758 01/18/18 1238 01/19/18 0700 01/20/18 0701  NA 132*   < > 135   < > 132* 136 133* 134* 134*  K 7.4*   < > 5.3*   < > 5.5*  5.1 5.0 4.3 4.0  CL 92*   < > 94*   < > 93* 98 98 97* 95*  CO2 26   < > 28   < > 28 28 25 27 28   GLUCOSE 106*   < > 112*   < > 88 83 104* 114* 120*  BUN 63*   < > 34*   < > 45* 28* 38* 26* 22*  CREATININE 14.46*   < > 10.03*   < > 12.01* 8.98* 11.47* 8.48* 6.67*  CALCIUM 8.2*   < > 8.3*   < > 8.3* 8.4* 8.7* 8.6* 9.1  MG 2.5*  --  2.1  --   --   --   --   --   --   PHOS 7.2*  --  5.8*  --   --   --  4.8*  --  3.4   < > = values in this interval not displayed.   Liver Function Tests: Recent Labs  Lab 01/15/18 0400 01/18/18 1238 01/20/18 0701  AST 20  --   --   ALT 25  --   --  ALKPHOS 70  --   --   BILITOT 1.1  --   --   PROT 6.3*  --   --   ALBUMIN 2.4* 2.1* 2.6*   No results for input(s): LIPASE, AMYLASE in the last 168 hours. No results for input(s): AMMONIA in the last 168 hours. CBC: Recent Labs  Lab 01/15/18 0400  01/17/18 0534 01/18/18 0524 01/18/18 2014 01/19/18 0435 01/20/18 0701  WBC 21.0*   < > 10.0 6.9 8.8 8.1 9.9  NEUTROABS 17.5*  --   --   --   --   --   --   HGB 8.0*   < > 6.3* 6.7* 8.9* 7.9* 8.8*  HCT 24.4*   < > 19.6* 21.2* 26.6* 24.4* 27.4*  MCV 97.6   < > 99.0 101.0* 94.7 97.6 97.9  PLT 217   < > 243 241 255 242 266   < > = values in this interval not displayed.   Cardiac Enzymes:   Recent Labs  Lab 01/13/18 2022  TROPONINI 0.20*   BNP (last 3 results) Recent Labs    07/18/17 0531  BNP 1,325.2*    ProBNP (last 3 results) No results for input(s): PROBNP in the last 8760 hours.  CBG: Recent Labs  Lab 01/19/18 2024 01/19/18 2156 01/20/18 0022 01/20/18 0440 01/20/18 1228  GLUCAP 70 93 82 79 95    Recent Results (from the past 240 hour(s))  MRSA PCR Screening     Status: None   Collection Time: 01/13/18 11:56 AM  Result Value Ref Range Status   MRSA by PCR NEGATIVE NEGATIVE Final    Comment:        The GeneXpert MRSA Assay (FDA approved for NASAL specimens only), is one component of a comprehensive MRSA  colonization surveillance program. It is not intended to diagnose MRSA infection nor to guide or monitor treatment for MRSA infections. Performed at [facility], [place] [address], [place], [place] [address]   Culture, blood (routine x 2)     Status: None   Collection Time: 01/14/18 10:54 AM  Result Value Ref Range Status   Specimen Description BLOOD RIGHT HAND  Final   Special Requests   Final    BOTTLES DRAWN AEROBIC ONLY Blood Culture adequate volume   Culture   Final    NO GROWTH 5 DAYS Performed at [facility], [place] [address], [place], [place] [address]    Report Status 01/19/2018 FINAL  Final  Culture, blood (routine x 2)     Status: None   Collection Time: 01/14/18 11:03 AM  Result Value Ref Range Status   Specimen Description BLOOD RIGHT HAND  Final   Special Requests   Final    BOTTLES DRAWN AEROBIC ONLY Blood Culture results may not be optimal due to an inadequate volume of blood received in culture bottles   Culture   Final    NO GROWTH 5 DAYS Performed at [facility], [place] [address], [place], [place] [address]    Report Status 01/19/2018 FINAL  Final     Studies: No results found.  Scheduled Meds: . allopurinol  150 mg Oral QODAY  . amiodarone  100 mg Oral Daily  . Chlorhexidine Gluconate Cloth  6 each Topical Q0600  . Chlorhexidine Gluconate Cloth  6 each Topical Q0600  . Chlorhexidine Gluconate Cloth  6 each Topical Q0600  . darbepoetin (ARANESP) injection - DIALYSIS  60 mcg Intravenous Q Wed-HD  . doxercalciferol  2 mcg Intravenous Q M,W,F-HD  . ferric citrate  210 mg Oral TID WC  . midodrine  10 mg Oral TID WC  . mirtazapine  7.5 mg Oral QHS  . multivitamin  1 tablet Oral QHS  . pantoprazole  40 mg Oral BID  . sodium chloride flush  10-40 mL Intracatheter Q12H    Continuous Infusions: . sodium chloride 250 mL (01/15/18 1102)  . ceFEPime (MAXIPIME) IV 1 g (01/19/18 2131)  . dextrose 10 mL/hr at 01/17/18 Wilder  MD, [facility]  If 7PM-7AM, please contact night-coverage at [email or web address], password [facility] 01/20/2018, 4:38 PM  LOS: 7 days

## 2018-01-20 NOTE — Progress Notes (Signed)
Subjective:   HD currently; feeling a bit better.  No fevers, chills.  Did extra treatment yesterday as well over EDW - only 2.5 UF due to hypotension.  No cramps.  Recommended dry UF today but when we switched it over he felt like he became cold and developed bladder spasm.  He's insistent that he would like to continue hemodialysis.   Objective Vital signs in last 24 hours: Vitals:   01/20/18 0719 01/20/18 0724 01/20/18 0730 01/20/18 0800  BP: 116/72 123/73 126/77 102/69  Pulse: 92 90 90 89  Resp: 20 20    Temp:      TempSrc:      SpO2:      Weight:      Height:       Weight change: -4.9 kg  Physical Exam: General:  Obese WDWN male in bed  NAD  Heart: RRR  1/6 sem  No rub or gallop Lungs: CTA , non labored breathing  Abdomen: BS pos .  Obese, Soft  , diffusely  Tender   Extremities:trace  pedal edeam / R hand mild swelling  Dialysis Access: Pos bruit  LUA AVF   OP [facility]  4h 111.5kg 2/2.25 bath P4 LUE AVF Hep 7000 +3000 midrun Hectorol 63mcg IV TIW Parsabiv 12.5mg  IV TIW  Problem/Plan: 1. L renal hemorrhage - s/p L renal artery embolization by IR 8/23. Urology following and saw him yesterday - conservative mgmt.  Encouraging that his Hb has stabilized.  2. Hypotension -  On midodrine at  10 mg  TID, BP currently reasonable but hypotension limited UF yesterday.  EDW 111.5 and pre wt today 117.2kg.   Tried dry UF but he's insistent that he wants hemodialysis - DFR set to 300.  Extra treatment again tomorrow.  3. Hyperkalemia -resovled 4. ESRD -MWF HD. HD today, try DUF again tomorrow (See above).Dr. Jonnie Finner spoke w/ urology , recommended continue holding heparin until more stable (Hb and BP's)- will hold for now. 5. Anemia of CKD/ABL - has rec'd multiple pRBC transfusions this admission.  Hb finally appears to have stabilized. Aranesp give 60 mcg q wed hd start 01/18/18 . Iron sat 18% but in light of hematoma and multiple pRBC will hold on IV iron.   6. Metabolic bone disease -Ca ok. Phos  7.3 >5.8>4.8. Resumed binders when eating. Continue Hectorol.No parsabiv here.  7. Aflutter - on amiodarone. Coumadin on hold.  8. Hx RCC s/p right nephrectomy 2002 9. NICM EF 30-35% working towards euvolemia  Jannifer Hick MD [place] [phone] 01/20/2018,8:28 AM  LOS: 7 days   Labs: Basic Metabolic Panel: Recent Labs  Lab 01/15/18 0400  01/18/18 1238 01/19/18 0700 01/20/18 0701  NA 135   < > 133* 134* 134*  K 5.3*   < > 5.0 4.3 4.0  CL 94*   < > 98 97* 95*  CO2 28   < > 25 27 28   GLUCOSE 112*   < > 104* 114* 120*  BUN 34*   < > 38* 26* 22*  CREATININE 10.03*   < > 11.47* 8.48* 6.67*  CALCIUM 8.3*   < > 8.7* 8.6* 9.1  PHOS 5.8*  --  4.8*  --  3.4   < > = values in this interval not displayed.   Liver Function Tests: Recent Labs  Lab 01/15/18 0400 01/18/18 1238 01/20/18 0701  AST 20  --   --   ALT 25  --   --   Arabella Merles  70  --   --   BILITOT 1.1  --   --   PROT 6.3*  --   --   ALBUMIN 2.4* 2.1* 2.6*   No results for input(s): LIPASE, AMYLASE in the last 168 hours. No results for input(s): AMMONIA in the last 168 hours. CBC: Recent Labs  Lab 01/15/18 0400  01/17/18 0534 01/18/18 0524 01/18/18 2014 01/19/18 0435 01/20/18 0701  WBC 21.0*   < > 10.0 6.9 8.8 8.1 9.9  NEUTROABS 17.5*  --   --   --   --   --   --   HGB 8.0*   < > 6.3* 6.7* 8.9* 7.9* 8.8*  HCT 24.4*   < > 19.6* 21.2* 26.6* 24.4* 27.4*  MCV 97.6   < > 99.0 101.0* 94.7 97.6 97.9  PLT 217   < > 243 241 255 242 266   < > = values in this interval not displayed.   Cardiac Enzymes: Recent Labs  Lab 01/13/18 1156 01/13/18 2022  TROPONINI 0.21* 0.20*   CBG: Recent Labs  Lab 01/19/18 1653 01/19/18 2024 01/19/18 2156 01/20/18 0022 01/20/18 0440  GLUCAP 73 70 93 82 79    Studies/Results: No results found. Medications: . sodium chloride 250 mL (01/15/18 1102)  . ceFEPime (MAXIPIME) IV 1 g (01/19/18 2131)  . dextrose  10 mL/hr at 01/17/18 1546   . doxercalciferol      . midodrine      . allopurinol  150 mg Oral QODAY  . amiodarone  100 mg Oral Daily  . Chlorhexidine Gluconate Cloth  6 each Topical Q0600  . Chlorhexidine Gluconate Cloth  6 each Topical Q0600  . darbepoetin (ARANESP) injection - DIALYSIS  60 mcg Intravenous Q Wed-HD  . doxercalciferol  2 mcg Intravenous Q M,W,F-HD  . ferric citrate  210 mg Oral TID WC  . midodrine  10 mg Oral TID WC  . mirtazapine  7.5 mg Oral QHS  . multivitamin  1 tablet Oral QHS  . pantoprazole  40 mg Oral BID  . sodium chloride flush  10-40 mL Intracatheter Q12H

## 2018-01-20 NOTE — Progress Notes (Signed)
PT Cancellation Note  Patient Details Name: Cobin Cadavid MRN: [id number] DOB: May 08, 1961   Cancelled Treatment:    Reason Eval/Treat Not Completed: Patient at procedure or test/unavailable At HD; will follow-up as time allows  Lanney Gins, PT, DPT 01/20/18 9:52 AM Pager: [phone]

## 2018-01-21 LAB — CBC WITH DIFFERENTIAL/PLATELET
Abs Immature Granulocytes: 0.5 10*3/uL — ABNORMAL HIGH (ref 0.0–0.1)
Basophils Absolute: 0.1 10*3/uL (ref 0.0–0.1)
Basophils Relative: 1 %
Eosinophils Absolute: 0.2 10*3/uL (ref 0.0–0.7)
Eosinophils Relative: 2 %
HCT: 26.9 % — ABNORMAL LOW (ref 39.0–52.0)
Hemoglobin: 8.5 g/dL — ABNORMAL LOW (ref 13.0–17.0)
Immature Granulocytes: 5 %
Lymphocytes Relative: 8 %
Lymphs Abs: 0.8 10*3/uL (ref 0.7–4.0)
MCH: 31.3 pg (ref 26.0–34.0)
MCHC: 31.6 g/dL (ref 30.0–36.0)
MCV: 98.9 fL (ref 78.0–100.0)
Monocytes Absolute: 1.2 10*3/uL — ABNORMAL HIGH (ref 0.1–1.0)
Monocytes Relative: 12 %
Neutro Abs: 7.2 10*3/uL (ref 1.7–7.7)
Neutrophils Relative %: 72 %
Platelets: 248 10*3/uL (ref 150–400)
RBC: 2.72 MIL/uL — ABNORMAL LOW (ref 4.22–5.81)
RDW: 16.1 % — ABNORMAL HIGH (ref 11.5–15.5)
WBC: 9.8 10*3/uL (ref 4.0–10.5)

## 2018-01-21 LAB — RENAL FUNCTION PANEL
Albumin: 2.5 g/dL — ABNORMAL LOW (ref 3.5–5.0)
Anion gap: 10 (ref 5–15)
BUN: 32 mg/dL — ABNORMAL HIGH (ref 6–20)
CO2: 26 mmol/L (ref 22–32)
Calcium: 9.5 mg/dL (ref 8.9–10.3)
Chloride: 95 mmol/L — ABNORMAL LOW (ref 98–111)
Creatinine, Ser: 7.46 mg/dL — ABNORMAL HIGH (ref 0.61–1.24)
GFR calc Af Amer: 9 mL/min — ABNORMAL LOW (ref >60)
GFR calc non Af Amer: 7 mL/min — ABNORMAL LOW (ref >60)
Glucose, Bld: 110 mg/dL — ABNORMAL HIGH (ref 70–99)
Phosphorus: 3.7 mg/dL (ref 2.5–4.6)
Potassium: 3.7 mmol/L (ref 3.5–5.1)
Sodium: 131 mmol/L — ABNORMAL LOW (ref 135–145)

## 2018-01-21 LAB — GLUCOSE, CAPILLARY
Glucose-Capillary: 100 mg/dL — ABNORMAL HIGH (ref 70–99)
Glucose-Capillary: 114 mg/dL — ABNORMAL HIGH (ref 70–99)
Glucose-Capillary: 123 mg/dL — ABNORMAL HIGH (ref 70–99)
Glucose-Capillary: 124 mg/dL — ABNORMAL HIGH (ref 70–99)
Glucose-Capillary: 85 mg/dL (ref 70–99)
Glucose-Capillary: 94 mg/dL (ref 70–99)

## 2018-01-21 MED ORDER — LIDOCAINE HCL (PF) 1 % IJ SOLN
5.0000 mL | INTRAMUSCULAR | Status: DC | PRN
  Filled 2018-01-21: qty 5

## 2018-01-21 MED ORDER — ALTEPLASE 2 MG IJ SOLR
2.0000 mg | Freq: Once | INTRAMUSCULAR | Status: DC | PRN
  Filled 2018-01-21: qty 2

## 2018-01-21 MED ORDER — SODIUM CHLORIDE 0.9 % IV SOLN: 100.0000 mL | INTRAVENOUS | Status: DC | PRN

## 2018-01-21 MED ORDER — HEPARIN SODIUM (PORCINE) 1000 UNIT/ML DIALYSIS
1000.0000 [IU] | INTRAMUSCULAR | Status: DC | PRN
  Filled 2018-01-21: qty 1

## 2018-01-21 MED ORDER — MIDODRINE HCL 5 MG PO TABS
ORAL_TABLET | ORAL | Status: AC
  Administered 2018-01-21: 10 mg via ORAL
  Filled 2018-01-21: qty 4

## 2018-01-21 MED ORDER — LIDOCAINE-PRILOCAINE 2.5-2.5 % EX CREA
1.0000 "application " | TOPICAL_CREAM | CUTANEOUS | Status: DC | PRN
  Filled 2018-01-21: qty 5

## 2018-01-21 MED ORDER — PENTAFLUOROPROP-TETRAFLUOROETH EX AERO: 1.0000 "application " | INHALATION_SPRAY | CUTANEOUS | Status: DC | PRN

## 2018-01-21 NOTE — Progress Notes (Signed)
PROGRESS NOTE  [name] XBD:[id number] DOB: 1960-09-28 DOA: 01/12/2018 PCP: Iona Beard, MD  Brief Summary:  H/o PAF on coumadin, h/o ESRD on HD presented with Spont hemorrhage from left kidney with supertherapeutic INR, s/p vit K, ffp/kcentra Hemorrhagic shock, needing pressor, admitted to ICU, improving , off pressors Critical care/IR/urology/nephrology consulted, despite extra hemodialysis sessions patient with remains above his EDW      HPI/Recap of past 24 hours:  despite extra hemodialysis sessions patient with remains above his EDW No fevers no new complaints  Assessment/Plan: Principal Problem:   Hemorrhage of left kidney Active Problems:   Systolic CHF, chronic ([place])   ESRD (end stage renal disease) on dialysis ([place])   Hypotension   History of atrial flutter  1)Hemorrhagic shock, Spontaneous hemorrhage of left kidney in the setting of supratherapeutic INR-  -H/o RCC s/p  nephrectomy on the  right in 2002 -INR 4.59 on presentation, coumadin held, he received vit k/ffp, kcentra and 2units of prbc on day of admission, he is s/p IR embolization for left renal hemorrhage -required embolization of entire renal blood flow (left main renal artery)to the left kidney emergently on 8/23, he required pressors for bp supports, he is transferred to ICU after IR embolization on day of admission,  -he is improving, off pressor on 8/25, bp stable on midodrine tid -he is  transferred back to hospitalist service on 8/26 -case discussed with IR on call Dr Annamaria Boots on 8/27 who reviewed imaging from 8/23 and states left renal artery embolization on 8/23 was successful, no more IR intervention. Advise to discuss case with urology. -case discussed with urology Dr Matilde Sprang who recommend conservation management for now with prn blood transfusion and pain control,  Small volume hematuria from time to time which according to urology notes from 01/21/2018 is actually expected  2)Blood loss anemia  with h/o anemia of chronic disease: -hgb baseline above 10, he is on iron based binder at home, -He is on aranesp injection per nephrology -S/p 2prbc on day of admission on 8/23, prbc x1 unit on 8/27, additional 2 units of packed cells on 01/18/2017, hemoglobin now 8.5,      3)Leukocytosis- From stress vs infection, CXR with consolidation LLL.  procalcitonin 18, Blood culture no growth, mrsa screening negative He is started on cefepime per pharmacy since 8/25, wbc normalized on 8/27, continue cefepime to finish total of 7 days   4)ESRD on HD MWF- -remains volume overloaded, HD MWF at [facility]. On HD since 05/2010.  dialysis through left avf, had extra hemodialysis treatment on 01/19/2018 to address volume overload status, last HD 01/21/2018.  despite extra hemodialysis sessions patient with remains above his EDW   5)Paroxysmal atrial flutter with prior history of stroke ( no residual per patient, but with some memory impairment at baseline)--- CHA2DS2- VASc score   is = 5 (CVA, HTN, CHF, DM)    Which is  equal to = 7.2  % annual. risk of stroke , -he reports last  INR in the clinic prior to hospitalization was 5.8, Continue to hold Coumadin, if anticoagulation needed may consider Eliquis. Has been on Coumadin for 2 years, need to be cleared by urology before restarting back on anticoagulation, he does not wants to be back on coumadin if there is another agent we can choose.  He is currently in sinus rhythm on amiodarone - 6)HFrEF/NICM--- patient appears to have had acute on chronic chronic combined diastolic and systolic dysfunction CHF exacerbation with volume overload,  last known EF 30%, remains volume overloaded, currently addressing volume status with hemodialysis   7)Chronic Hypotension, has been on midodrine prn prior to hospitalization Now on scheduled midodrine  8)Diet controlled diabetes, he was on metformin briefly for two years, has been off diabetic meds since 2013,  avoid over aggressive diabetic control due to history of symptomatic hypoglycemia,   9)Anorexia-slightly improved, continue Remeron  nephrology to decide on binder choice.    Disposition/Need for in-Hospital Stay- patient unable to be discharged at this time due to volume overload status requiring additional hemodialysis sessions,  despite extra hemodialysis sessions patient with remains above his EDW, nephrology service follow hemodialysis to address volume overload  Code Status: full  Family Communication: patient Disposition Plan:  ??? Home with Home health   Consultants:  Critical care  IR  Urology  Nephrology  Procedures:  urgent IR angiogram and embolization on 8/23  Right femoral line placement on 8/23 by IR, removed 01/20/18  prbc transfusion on 8/23 and on 8/27  Dialysis MWF   Antibiotics:  Cefepime from 8/25    Objective: BP 90/64 (BP Location: Right Arm)   Pulse 91   Temp 98.4 F (36.9 C) (Oral)   Resp (!) 24   Ht 6\' 1"  (1.854 m)   Wt 114.3 kg   SpO2 100%   BMI 33.25 kg/m   Intake/Output Summary (Last 24 hours) at 01/21/2018 1924 Last data filed at 01/21/2018 1215 Gross per 24 hour  Intake 312.81 ml  Output 3500 ml  Net -3187.19 ml   Filed Weights   01/20/18 0710 01/20/18 1120 01/21/18 0840  Weight: 117.2 kg 114.1 kg 114.3 kg    Exam: Patient is examined daily including today on 01/21/2018, exams remain the same as of yesterday except that has changed    Physical Exam  Gen:- Awake Alert, in no acute distress HEENT:- [place].AT, No sclera icterus Neck-Supple Neck, +ve JVD,.  Lungs-improving  air movement, bibasilar Rales noted CV- S1, S2 normal Abd-  +ve B.Sounds, Abd Soft, No tenderness,    Extremity/Skin:- Left upper extremity exam reveals AV fistula with positive thrill and bruit, right femoral line to be removed on 01/20/18, slightly improving lower extremity edema Psych-affect is appropriate, oriented x3, forgetful at  times Neuro-no new focal deficits, no tremors   Data Reviewed: Basic Metabolic Panel: Recent Labs  Lab 01/15/18 0400  01/17/18 0758 01/18/18 1238 01/19/18 0700 01/20/18 0701 01/21/18 0936  NA 135   < > 136 133* 134* 134* 131*  K 5.3*   < > 5.1 5.0 4.3 4.0 3.7  CL 94*   < > 98 98 97* 95* 95*  CO2 28   < > 28 25 27 28 26   GLUCOSE 112*   < > 83 104* 114* 120* 110*  BUN 34*   < > 28* 38* 26* 22* 32*  CREATININE 10.03*   < > 8.98* 11.47* 8.48* 6.67* 7.46*  CALCIUM 8.3*   < > 8.4* 8.7* 8.6* 9.1 9.5  MG 2.1  --   --   --   --   --   --   PHOS 5.8*  --   --  4.8*  --  3.4 3.7   < > = values in this interval not displayed.   Liver Function Tests: Recent Labs  Lab 01/15/18 0400 01/18/18 1238 01/20/18 0701 01/21/18 0936  AST 20  --   --   --   ALT 25  --   --   --  ALKPHOS 70  --   --   --   BILITOT 1.1  --   --   --   PROT 6.3*  --   --   --   ALBUMIN 2.4* 2.1* 2.6* 2.5*   No results for input(s): LIPASE, AMYLASE in the last 168 hours. No results for input(s): AMMONIA in the last 168 hours. CBC: Recent Labs  Lab 01/15/18 0400  01/18/18 0524 01/18/18 2014 01/19/18 0435 01/20/18 0701 01/21/18 0936  WBC 21.0*   < > 6.9 8.8 8.1 9.9 9.8  NEUTROABS 17.5*  --   --   --   --   --  7.2  HGB 8.0*   < > 6.7* 8.9* 7.9* 8.8* 8.5*  HCT 24.4*   < > 21.2* 26.6* 24.4* 27.4* 26.9*  MCV 97.6   < > 101.0* 94.7 97.6 97.9 98.9  PLT 217   < > 241 255 242 266 248   < > = values in this interval not displayed.   Cardiac Enzymes:   No results for input(s): CKTOTAL, CKMB, CKMBINDEX, TROPONINI in the last 168 hours. BNP (last 3 results) Recent Labs    07/18/17 0531  BNP 1,325.2*    ProBNP (last 3 results) No results for input(s): PROBNP in the last 8760 hours.  CBG: Recent Labs  Lab 01/21/18 0047 01/21/18 0421 01/21/18 0632 01/21/18 0819 01/21/18 1725  GLUCAP 114* 94 100* 124* 85    Recent Results (from the past 240 hour(s))  MRSA PCR Screening     Status: None    Collection Time: 01/13/18 11:56 AM  Result Value Ref Range Status   MRSA by PCR NEGATIVE NEGATIVE Final    Comment:        The GeneXpert MRSA Assay (FDA approved for NASAL specimens only), is one component of a comprehensive MRSA colonization surveillance program. It is not intended to diagnose MRSA infection nor to guide or monitor treatment for MRSA infections. Performed at [facility], [place] [address], [place], [place] [address]   Culture, blood (routine x 2)     Status: None   Collection Time: 01/14/18 10:54 AM  Result Value Ref Range Status   Specimen Description BLOOD RIGHT HAND  Final   Special Requests   Final    BOTTLES DRAWN AEROBIC ONLY Blood Culture adequate volume   Culture   Final    NO GROWTH 5 DAYS Performed at [facility], [place] [address] [place], [place] [address]    Report Status 01/19/2018 FINAL  Final  Culture, blood (routine x 2)     Status: None   Collection Time: 01/14/18 11:03 AM  Result Value Ref Range Status   Specimen Description BLOOD RIGHT HAND  Final   Special Requests   Final    BOTTLES DRAWN AEROBIC ONLY Blood Culture results may not be optimal due to an inadequate volume of blood received in culture bottles   Culture   Final    NO GROWTH 5 DAYS Performed at [facility], [place] [address] [place], [place] [address]    Report Status 01/19/2018 FINAL  Final     Studies: No results found.  Scheduled Meds: . allopurinol  150 mg Oral QODAY  . amiodarone  100 mg Oral Daily  . Chlorhexidine Gluconate Cloth  6 each Topical Q0600  . Chlorhexidine Gluconate Cloth  6 each Topical Q0600  . Chlorhexidine Gluconate Cloth  6 each Topical Q0600  . darbepoetin (ARANESP) injection - DIALYSIS  60 mcg Intravenous Q Wed-HD  . doxercalciferol  2 mcg Intravenous Q M,W,F-HD  . ferric citrate  210 mg Oral TID WC  . midodrine  10 mg Oral TID WC  . mirtazapine  7.5 mg Oral QHS  . multivitamin  1 tablet Oral QHS  . oxybutynin  5 mg Oral TID   . pantoprazole  40 mg Oral BID  . sodium chloride flush  10-40 mL Intracatheter Q12H    Continuous Infusions: . sodium chloride Stopped (01/21/18 0002)  . sodium chloride    . sodium chloride    . ceFEPime (MAXIPIME) IV Stopped (01/20/18 2347)  . dextrose 10 mL/hr at 01/17/18 [place] MD, [facility]  If 7PM-7AM, please contact night-coverage at [email or web address], password [facility] 01/21/2018, 7:24 PM  LOS: 8 days

## 2018-01-21 NOTE — Progress Notes (Signed)
Patient ID: [name], male   DOB: [birth year], 57 y.o.   MRN: [id number]    Assessment: Left renal hemorrhage: He remained stable now with no flank pain.  His hemoglobin has stabilized.  He is hemodynamically stable.  He will likely need reimaging once all of the blood has been absorbed to rule out underlying neoplasm as the cause of his spontaneous hemorrhage.  We discussed the fact that the small amount of blood per urethra is likely secondary to the bleeding that occurred and may persist for some time until any clot that may have collected in the bladder breaks down.   Plan:  No new urologic recommendations at this time.   Subjective: Patient reports no complaints of flank pain.  He does report having a small amount of blood per urethra.  He said he typically only urinates a few drops per year.  He is not having any suprapubic discomfort.  Objective: Vital signs in last 24 hours: Temp:  [97.6 F (36.4 C)-98.8 F (37.1 C)] 98.1 F (36.7 C) (08/31 0821) Pulse Rate:  [50-109] 93 (08/31 0402) Resp:  [16-27] 23 (08/31 0402) BP: (95-123)/(64-75) 102/75 (08/31 0402) SpO2:  [96 %-100 %] 98 % (08/31 0402) Weight:  [114.1 kg] 114.1 kg (08/30 1120)A  Intake/Output from previous day: 08/30 0701 - 08/31 0700 In: 312.8 [I.V.:12.8; IV Piggyback:300] Out: 3500  Intake/Output this shift: No intake/output data recorded.  Past Medical History:  Diagnosis Date  . Atrial flutter ([place])   . 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 Cardiomyopathy   . CHF (congestive heart failure) ([place])   . Coronary artery disease    normal coronaries by 10/10/08 cath, Cardiac Cath 08-04-12 epic.Dr. Doylene Canard follows  . Diabetes mellitus    pt. states he's borderline diabetic., no longer taking med,- off med. since 2013  . Dialysis patient [facility])    Mon-Wed-Fri([place])- Left AV fistula  . Diverticulitis November 2016   reoccurred in December 2016  . ESRD (end stage renal  disease) ([place])   . GERD (gastroesophageal reflux disease)   . Gout   . History of nephrectomy 07/04/2012   History of right nephrectomy in 2002 for renal cell carcinoma   . History of unilateral nephrectomy   . Hypertension   . Low iron   . Myocardial infarction [facility]) ?2006  . Renal cell carcinoma    dialysis- MWF- Dr. Mercy Moore follows.  . Renal insufficiency   . Shortness of breath 05/19/11   "at rest, lying down, w/exertion"  . Stroke [facility]) 02/2011   05/19/11 denies residual  . Umbilical hernia 32/99/24   unrepaired    Physical Exam:  General: Awake, alert and in no apparent distress Lungs: Normal respiratory effort, chest expands symmetrically.  Abdomen: Soft, non-tender & non-distended.  Lab Results: Recent Labs    01/18/18 2014 01/19/18 0435 01/20/18 0701  WBC 8.8 8.1 9.9  HGB 8.9* 7.9* 8.8*  HCT 26.6* 24.4* 27.4*   BMET Recent Labs    01/19/18 0700 01/20/18 0701  NA 134* 134*  K 4.3 4.0  CL 97* 95*  CO2 27 28  GLUCOSE 114* 120*  BUN 26* 22*  CREATININE 8.48* 6.67*  CALCIUM 8.6* 9.1   No results for input(s): LABURIN in the last 72 hours. Results for orders placed or performed during the hospital encounter of 01/12/18  MRSA PCR Screening     Status: None   Collection Time: 01/13/18 11:56 AM  Result Value  Ref Range Status   MRSA by PCR NEGATIVE NEGATIVE Final    Comment:        The GeneXpert MRSA Assay (FDA approved for NASAL specimens only), is one component of a comprehensive MRSA colonization surveillance program. It is not intended to diagnose MRSA infection nor to guide or monitor treatment for MRSA infections. Performed at [facility], [place] [address], [place], [place] [address]   Culture, blood (routine x 2)     Status: None   Collection Time: 01/14/18 10:54 AM  Result Value Ref Range Status   Specimen Description BLOOD RIGHT HAND  Final   Special Requests   Final    BOTTLES DRAWN AEROBIC ONLY Blood Culture adequate volume    Culture   Final    NO GROWTH 5 DAYS Performed at [facility], [place] [address], [place], [place] [address]    Report Status 01/19/2018 FINAL  Final  Culture, blood (routine x 2)     Status: None   Collection Time: 01/14/18 11:03 AM  Result Value Ref Range Status   Specimen Description BLOOD RIGHT HAND  Final   Special Requests   Final    BOTTLES DRAWN AEROBIC ONLY Blood Culture results may not be optimal due to an inadequate volume of blood received in culture bottles   Culture   Final    NO GROWTH 5 DAYS Performed at [facility], [place] [address], [place], [place] [address]    Report Status 01/19/2018 FINAL  Final    Studies/Results: No results found.    Bland Rudzinski C 01/21/2018, 8:58 AM

## 2018-01-21 NOTE — Evaluation (Signed)
Physical Therapy Evaluation Patient Details Name: [name] MRN: [id number] DOB: [birth year] Today's Date: 01/21/2018   History of Present Illness  57 year old w/ sig h/o ESRD (MWF HD), prior right nephrectomy, a flutter (on amio and warfarin), CHF and chronic hypotension (on midodrine). Usually anuric but went to the BR on 8/22 and voided BRB. DX eval in ER: large renal hematoma 3.5x5cm w/ mild hydro. Hgb 13.6 and INR was 4.59  on arrival. Admitted w/ left renal hemorrhage w/ significant associated flank pain.8/23 While in IR undergoing embolization became progressively hypotensive and tachycardic.   Clinical Impression  Patient presents with mild dependencies in gait and mobility due to generalized weakness and limited endurance.  Anticipate patient will progress steadily.  Patient will benefit from continued PT while in hospital to increase independence in gait and transfers.  Encouraged daily mobility with nursing.    Follow Up Recommendations No PT follow up    Equipment Recommendations  Rolling walker with 5" wheels    Recommendations for Other Services       Precautions / Restrictions Precautions Precautions: Fall Restrictions Weight Bearing Restrictions: (P) No      Mobility  Bed Mobility Overal bed mobility: Modified Independent                Transfers Overall transfer level: Modified independent   Transfers: Sit to/from Stand Sit to Stand: Modified independent (Device/Increase time)         General transfer comment: also transferred on/off toilet with modified indep  Ambulation/Gait Ambulation/Gait assistance: Supervision Gait Distance (Feet): 10 Feet(x2) Assistive device: Rolling walker (2 wheeled) Gait Pattern/deviations: WFL(Within Functional Limits)     General Gait Details: limited ambulation due to fatigue  Stairs            Wheelchair Mobility    Modified Rankin (Stroke Patients Only)       Balance Overall balance assessment:  Needs assistance Sitting-balance support: No upper extremity supported;Feet supported Sitting balance-Leahy Scale: Normal     Standing balance support: No upper extremity supported;During functional activity Standing balance-Leahy Scale: Fair                               Pertinent Vitals/Pain Pain Assessment: No/denies pain    Home Living Family/patient expects to be discharged to:: Private residence Living Arrangements: Spouse/significant other Available Help at Discharge: Family;Available 24 hours/day Type of Home: House Home Access: Stairs to enter Entrance Stairs-Rails: Right Entrance Stairs-Number of Steps: 1 Home Layout: One level Home Equipment: Walker - 2 wheels;Crutches      Prior Function Level of Independence: Independent         Comments: drives himself to dialysis; enjoys mowing lawns     Hand Dominance   Dominant Hand: Right    Extremity/Trunk Assessment   Upper Extremity Assessment Upper Extremity Assessment: Defer to OT evaluation    Lower Extremity Assessment Lower Extremity Assessment: Overall WFL for tasks assessed    Cervical / Trunk Assessment Cervical / Trunk Assessment: Normal  Communication   Communication: No difficulties  Cognition Arousal/Alertness: Awake/alert Behavior During Therapy: WFL for tasks assessed/performed Overall Cognitive Status: Within Functional Limits for tasks assessed                                        General Comments      Exercises  Assessment/Plan    PT Assessment Patient needs continued PT services  PT Problem List Decreased strength;Decreased activity tolerance;Decreased balance;Decreased mobility       PT Treatment Interventions DME instruction;Therapeutic activities;Gait training;Therapeutic exercise;Balance training;Functional mobility training;Patient/family education    PT Goals (Current goals can be found in the Care Plan section)  Acute Rehab PT  Goals Patient Stated Goal: to get stronger PT Goal Formulation: With patient Time For Goal Achievement: 01/28/18 Potential to Achieve Goals: Good    Frequency Min 3X/week   Barriers to discharge        Co-evaluation               AM-PAC PT "6 Clicks" Daily Activity  Outcome Measure Difficulty turning over in bed (including adjusting bedclothes, sheets and blankets)?: None Difficulty moving from lying on back to sitting on the side of the bed? : None Difficulty sitting down on and standing up from a chair with arms (e.g., wheelchair, bedside commode, etc,.)?: None Help needed moving to and from a bed to chair (including a wheelchair)?: A Little Help needed walking in hospital room?: A Little Help needed climbing 3-5 steps with a railing? : A Little 6 Click Score: 21    End of Session   Activity Tolerance: Patient tolerated treatment well Patient left: in bed;with call bell/phone within reach   PT Visit Diagnosis: Unsteadiness on feet (R26.81);Muscle weakness (generalized) (M62.81)    Time: 1610-9604 PT Time Calculation (min) (ACUTE ONLY): 21 min   Charges:   PT Evaluation $PT Eval Moderate Complexity: 1 Mod          01/21/2018 [place], PT Acute Rehabilitation Services Pager:  [phone] Office:  [phone]    Shanna Cisco 01/21/2018, 4:42 PM

## 2018-01-21 NOTE — Progress Notes (Signed)
Subjective:   HD currently; feeling a bit better.  UF 3.5L yesterday.  Still left ~3kg over EDW yesterday.   Objective Vital signs in last 24 hours: Vitals:   01/21/18 0002 01/21/18 0049 01/21/18 0402 01/21/18 0821  BP: 95/69  102/75   Pulse: (!) 50  93   Resp: (!) 22  (!) 23   Temp: 98.7 F (37.1 C) 98.4 F (36.9 C) 98.2 F (36.8 C) 98.1 F (36.7 C)  TempSrc: Oral Oral Oral   SpO2: 96%  98%   Weight:      Height:       Weight change: 3.9 kg  Physical Exam: General:  Obese WDWN male in bed  NAD  Heart: RRR  1/6 sem  No rub or gallop Lungs: CTA , non labored breathing  Abdomen: BS pos .  Obese, Soft  , diffusely  Tender   Extremities:trace  pedal edeam / R hand mild swelling  Dialysis Access: Pos bruit  LUA AVF   OP [facility]ialysis[facility]  4h 111.5kg 2/2.25 bath P4 LUE AVF Hep 7000 +3000 midrun Hectorol 44mcg IV TIW Parsabiv 12.5mg  IV TIW  Problem/Plan: 1. L renal hemorrhage - s/p L renal artery embolization by IR 8/23. Urology following - conservative mgmt.  Encouraging that his Hb has stabilized as of 2 days ago and clinically feeling improved.  2. Hypotension -  On midodrine at  10 mg  TID, BP currently reasonable but hypotension limited UF yesterday.  EDW 111.5 and post weight yesterday 114.1kg.   Extra treatment today.  3. Hyperkalemia -resovled 4. ESRD -MWF HD.  DUF today (See above).Still holding heparin with HD in light of recent sever bleed.  5. Anemia of CKD/ABL - has rec'd multiple pRBC transfusions this admission.  Hb finally appears to have stabilized. Aranesp give 60 mcg q wed hd start 01/18/18 . Iron sat 18% but in light of hematoma and multiple pRBC will hold on IV iron.  6. Metabolic bone disease -Ca ok. Phos  7.3 >5.8>4.8. Auryxia, last phos 3.4. Continue Hectorol.No parsabiv here.  7. Aflutter - on amiodarone. Coumadin on hold.  8. Hx RCC s/p right nephrectomy 2002 9. NICM EF 30-35% working towards euvolemia  Jannifer Hick MD [facility] 01/21/2018,8:43 AM  LOS: 8 days   Labs: Basic Metabolic Panel: Recent Labs  Lab 01/15/18 0400  01/18/18 1238 01/19/18 0700 01/20/18 0701  NA 135   < > 133* 134* 134*  K 5.3*   < > 5.0 4.3 4.0  CL 94*   < > 98 97* 95*  CO2 28   < > 25 27 28   GLUCOSE 112*   < > 104* 114* 120*  BUN 34*   < > 38* 26* 22*  CREATININE 10.03*   < > 11.47* 8.48* 6.67*  CALCIUM 8.3*   < > 8.7* 8.6* 9.1  PHOS 5.8*  --  4.8*  --  3.4   < > = values in this interval not displayed.   Liver Function Tests: Recent Labs  Lab 01/15/18 0400 01/18/18 1238 01/20/18 0701  AST 20  --   --   ALT 25  --   --   ALKPHOS 70  --   --   BILITOT 1.1  --   --   PROT 6.3*  --   --   ALBUMIN 2.4* 2.1* 2.6*   No results for input(s): LIPASE, AMYLASE in the last 168 hours. No results for input(s): AMMONIA in the last 168 hours.  CBC: Recent Labs  Lab 01/15/18 0400  01/17/18 0534 01/18/18 0524 01/18/18 2014 01/19/18 0435 01/20/18 0701  WBC 21.0*   < > 10.0 6.9 8.8 8.1 9.9  NEUTROABS 17.5*  --   --   --   --   --   --   HGB 8.0*   < > 6.3* 6.7* 8.9* 7.9* 8.8*  HCT 24.4*   < > 19.6* 21.2* 26.6* 24.4* 27.4*  MCV 97.6   < > 99.0 101.0* 94.7 97.6 97.9  PLT 217   < > 243 241 255 242 266   < > = values in this interval not displayed.   Cardiac Enzymes: No results for input(s): CKTOTAL, CKMB, CKMBINDEX, TROPONINI in the last 168 hours. CBG: Recent Labs  Lab 01/20/18 2025 01/21/18 0047 01/21/18 0421 01/21/18 0632 01/21/18 0819  GLUCAP 107* 114* 94 100* 124*    Studies/Results: No results found. Medications: . sodium chloride Stopped (01/21/18 0002)  . ceFEPime (MAXIPIME) IV Stopped (01/20/18 2347)  . dextrose 10 mL/hr at 01/17/18 1546   . allopurinol  150 mg Oral QODAY  . amiodarone  100 mg Oral Daily  . Chlorhexidine Gluconate Cloth  6 each Topical Q0600  . Chlorhexidine Gluconate Cloth  6 each Topical Q0600  . Chlorhexidine Gluconate Cloth  6 each Topical Q0600  . darbepoetin  (ARANESP) injection - DIALYSIS  60 mcg Intravenous Q Wed-HD  . doxercalciferol  2 mcg Intravenous Q M,W,F-HD  . ferric citrate  210 mg Oral TID WC  . midodrine  10 mg Oral TID WC  . mirtazapine  7.5 mg Oral QHS  . multivitamin  1 tablet Oral QHS  . oxybutynin  5 mg Oral TID  . pantoprazole  40 mg Oral BID  . sodium chloride flush  10-40 mL Intracatheter Q12H

## 2018-01-22 DIAGNOSIS — I120 Hypertensive chronic kidney disease with stage 5 chronic kidney disease or end stage renal disease: Secondary | ICD-10-CM | POA: Diagnosis not present

## 2018-01-22 DIAGNOSIS — N186 End stage renal disease: Secondary | ICD-10-CM | POA: Diagnosis not present

## 2018-01-22 DIAGNOSIS — Z992 Dependence on renal dialysis: Secondary | ICD-10-CM | POA: Diagnosis not present

## 2018-01-22 LAB — RENAL FUNCTION PANEL
Albumin: 2.7 g/dL — ABNORMAL LOW (ref 3.5–5.0)
Anion gap: 13 (ref 5–15)
BUN: 53 mg/dL — ABNORMAL HIGH (ref 6–20)
CO2: 23 mmol/L (ref 22–32)
Calcium: 10.4 mg/dL — ABNORMAL HIGH (ref 8.9–10.3)
Chloride: 96 mmol/L — ABNORMAL LOW (ref 98–111)
Creatinine, Ser: 10.56 mg/dL — ABNORMAL HIGH (ref 0.61–1.24)
GFR calc Af Amer: 6 mL/min — ABNORMAL LOW (ref >60)
GFR calc non Af Amer: 5 mL/min — ABNORMAL LOW (ref >60)
Glucose, Bld: 103 mg/dL — ABNORMAL HIGH (ref 70–99)
Phosphorus: 5 mg/dL — ABNORMAL HIGH (ref 2.5–4.6)
Potassium: 4.3 mmol/L (ref 3.5–5.1)
Sodium: 132 mmol/L — ABNORMAL LOW (ref 135–145)

## 2018-01-22 LAB — GLUCOSE, CAPILLARY
Glucose-Capillary: 128 mg/dL — ABNORMAL HIGH (ref 70–99)
Glucose-Capillary: 79 mg/dL (ref 70–99)
Glucose-Capillary: 84 mg/dL (ref 70–99)
Glucose-Capillary: 89 mg/dL (ref 70–99)
Glucose-Capillary: 90 mg/dL (ref 70–99)
Glucose-Capillary: 97 mg/dL (ref 70–99)

## 2018-01-22 LAB — HEMOGLOBIN AND HEMATOCRIT, BLOOD
HCT: 29.1 % — ABNORMAL LOW (ref 39.0–52.0)
Hemoglobin: 9.5 g/dL — ABNORMAL LOW (ref 13.0–17.0)

## 2018-01-22 MED ORDER — CHLORHEXIDINE GLUCONATE CLOTH 2 % EX PADS
6.0000 | MEDICATED_PAD | Freq: Every day | CUTANEOUS | Status: DC
  Administered 2018-01-22: 6 via TOPICAL

## 2018-01-22 NOTE — Progress Notes (Signed)
PROGRESS NOTE  [name] SKA:[id number] DOB: Nov 17, 1960 DOA: 01/12/2018 PCP: Iona Beard, MD  Brief Summary:  H/o PAF on coumadin, h/o ESRD on HD presented with Spont hemorrhage from left kidney with supertherapeutic INR, s/p vit K, ffp/kcentra Hemorrhagic shock, needing pressor, admitted to ICU, improving , off pressors Critical care/IR/urology/nephrology consulted,    transferred to ICU 01/13/2018 transferred back to hospitalist service on 01/16/2018    HPI/Recap of past 24 hours: Flank pain is better, no vomiting, no significant dizziness,  Assessment/Plan: Principal Problem:   Hemorrhage of left kidney Active Problems:   Systolic CHF, chronic ([place])   ESRD (end stage renal disease) on dialysis ([place])   Hypotension   History of atrial flutter  1)Hemorrhagic shock, Spontaneous hemorrhage of left kidney in the setting of supratherapeutic INR-  -H/o RCC s/p  nephrectomy on the  right in 2002 -INR 4.59 on presentation, coumadin held, he received vit k/ffp, kcentra and 2units of prbc on day of admission, he is s/p IR embolization for left renal hemorrhage -required embolization of entire renal blood flow (left main renal artery)to the left kidney emergently on 01/13/18 (s/p left IR renal artery embolization 01/13/18 for hemodynamically significant bleed from likely left renal cyst ), , he required pressors for bp supports, -he is i-he  -case discussed with IR on call Dr Annamaria Boots on 8/27 who reviewed imaging from 8/23 and states left renal artery embolization on 8/23 was successful, no more IR intervention. Advise to discuss case with urology. -case discussed with urology Dr Matilde Sprang who recommend conservation management for now with prn blood transfusion and pain control,  Small volume hematuria from time to time which according to urology notes from 01/21/2018 is actually expected  2)Blood Loss Anemia with H/o Anemia of chronic Disease: -hgb baseline above 10, he is on iron based binder  at home, -He is on aranesp injection per nephrology -S/p 2prbc on day of admission on 8/23, prbc x1 unit on 8/27, additional 2 units of packed cells on 01/18/2017, hemoglobin now trending up to 9.5,      3)Leukocytosis- From stress vs infection, CXR with consolidation LLL.  procalcitonin 18, Blood culture no growth, mrsa screening negative He is started on cefepime per pharmacy since 8/25, wbc normalized on 8/27, continue cefepime to finish total of 7 days (last dose 01/22/18)   4)ESRD on HD MWF- -hemodialysis is helping to address volume overload status, previous dry weight was 111.5 kg, patient will most likely need new EDW, HD MWF at [facility]. On HD since 05/2010.  dialysis through left avf, had extra hemodialysis treatment on 01/19/2018 to address volume overload status, last HD 01/21/2018.  Next HD 01/23/2018   5)Paroxysmal atrial flutter with prior history of stroke ( no residual per patient, but with some memory impairment at baseline)--- CHA2DS2- VASc score   is = 5 (CVA, HTN, CHF, DM)    Which is  equal to = 7.2  % annual. risk of stroke , -he reports last  INR in the clinic prior to hospitalization was 5.8, Continue to hold Coumadin, if anticoagulation needed may consider Eliquis. Has been on Coumadin for 2 years, need to be cleared by urology before restarting back on anticoagulation, he does not wants to be back on coumadin if there is another agent we can choose.  He is currently in sinus rhythm on amiodarone - 6)HFrEF/NICM--- patient appears to have had acute on chronic chronic combined diastolic and systolic dysfunction CHF exacerbation with volume overload, last  known EF 30%, remains volume overloaded, currently addressing volume status with hemodialysis   7)Chronic Hypotension, has been on midodrine prn prior to hospitalization Now on scheduled midodrine on HD days   8)Diet controlled diabetes, he was on metformin briefly for two years, has been off diabetic meds since  2013, avoid over aggressive diabetic control due to history of symptomatic hypoglycemia,   9)Anorexia-improving slowly, continue Remeron  nephrology to decide on binder choice.    Disposition/Need for in-Hospital Stay- patient unable to be discharged at this time due to volume overload status requiring additional hemodialysis sessions, next hemodialysis 01/23/2018,  nephrology service follow hemodialysis to address volume overload  Code Status: full  Family Communication: patient Disposition Plan:  ??? Home with Home health   Consultants:  Critical care  IR  Urology  Nephrology  Procedures:  urgent IR angiogram and embolization on 8/23  Right femoral line placement on 8/23 by IR, removed 01/20/18  prbc transfusion on 8/23 and on 8/27  Dialysis MWF   Antibiotics:  Cefepime from 8/25, last Tuesday 01/22/18    Objective: BP 98/68 (BP Location: Right Arm)   Pulse 95   Temp 98.2 F (36.8 C) (Oral)   Resp 16   Ht 6\' 1"  (1.854 m)   Wt 114.3 kg   SpO2 97%   BMI 33.25 kg/m   Intake/Output Summary (Last 24 hours) at 01/22/2018 1611 Last data filed at 01/22/2018 6063 Gross per 24 hour  Intake 120.08 ml  Output -  Net 120.08 ml   Filed Weights   01/20/18 0710 01/20/18 1120 01/21/18 0840  Weight: 117.2 kg 114.1 kg 114.3 kg    Exam: Patient is examined daily including today on 01/22/2018, exams remain the same as of yesterday except that has changed    Physical Exam  Gen:- Awake Alert, in no acute distress HEENT:- [place].AT, No sclera icterus Neck-Supple Neck, +ve JVD,.  Lungs-improving  air movement, faint bibasilar rales, no wheezing CV- S1, S2 normal Abd-  +ve B.Sounds, Abd Soft, No tenderness,    Extremity/Skin:- Left upper extremity exam reveals AV fistula with positive thrill and bruit, right femoral line to be removed on 01/20/18, slightly improving lower extremity edema Psych-affect is appropriate, oriented x3, forgetful at times Neuro-no new focal  deficits, no tremors   Data Reviewed: Basic Metabolic Panel: Recent Labs  Lab 01/18/18 1238 01/19/18 0700 01/20/18 0701 01/21/18 0936 01/22/18 0853  NA 133* 134* 134* 131* 132*  K 5.0 4.3 4.0 3.7 4.3  CL 98 97* 95* 95* 96*  CO2 25 27 28 26 23   GLUCOSE 104* 114* 120* 110* 103*  BUN 38* 26* 22* 32* 53*  CREATININE 11.47* 8.48* 6.67* 7.46* 10.56*  CALCIUM 8.7* 8.6* 9.1 9.5 10.4*  PHOS 4.8*  --  3.4 3.7 5.0*   Liver Function Tests: Recent Labs  Lab 01/18/18 1238 01/20/18 0701 01/21/18 0936 01/22/18 0853  ALBUMIN 2.1* 2.6* 2.5* 2.7*   No results for input(s): LIPASE, AMYLASE in the last 168 hours. No results for input(s): AMMONIA in the last 168 hours. CBC: Recent Labs  Lab 01/18/18 0524 01/18/18 2014 01/19/18 0435 01/20/18 0701 01/21/18 0936 01/22/18 0853  WBC 6.9 8.8 8.1 9.9 9.8  --   NEUTROABS  --   --   --   --  7.2  --   HGB 6.7* 8.9* 7.9* 8.8* 8.5* 9.5*  HCT 21.2* 26.6* 24.4* 27.4* 26.9* 29.1*  MCV 101.0* 94.7 97.6 97.9 98.9  --   PLT 241 255 242 266  248  --    Cardiac Enzymes:   No results for input(s): CKTOTAL, CKMB, CKMBINDEX, TROPONINI in the last 168 hours. BNP (last 3 results) Recent Labs    07/18/17 0531  BNP 1,325.2*    ProBNP (last 3 results) No results for input(s): PROBNP in the last 8760 hours.  CBG: Recent Labs  Lab 01/21/18 2002 01/22/18 0038 01/22/18 0429 01/22/18 0751 01/22/18 1139  GLUCAP 123* 97 79 84 128*    Recent Results (from the past 240 hour(s))  MRSA PCR Screening     Status: None   Collection Time: 01/13/18 11:56 AM  Result Value Ref Range Status   MRSA by PCR NEGATIVE NEGATIVE Final    Comment:        The GeneXpert MRSA Assay (FDA approved for NASAL specimens only), is one component of a comprehensive MRSA colonization surveillance program. It is not intended to diagnose MRSA infection nor to guide or monitor treatment for MRSA infections. Performed at [facility], [place] [address],  [place], [place] [address]   Culture, blood (routine x 2)     Status: None   Collection Time: 01/14/18 10:54 AM  Result Value Ref Range Status   Specimen Description BLOOD RIGHT HAND  Final   Special Requests   Final    BOTTLES DRAWN AEROBIC ONLY Blood Culture adequate volume   Culture   Final    NO GROWTH 5 DAYS Performed at [facility], [place] [address] [place], [place] [address]    Report Status 01/19/2018 FINAL  Final  Culture, blood (routine x 2)     Status: None   Collection Time: 01/14/18 11:03 AM  Result Value Ref Range Status   Specimen Description BLOOD RIGHT HAND  Final   Special Requests   Final    BOTTLES DRAWN AEROBIC ONLY Blood Culture results may not be optimal due to an inadequate volume of blood received in culture bottles   Culture   Final    NO GROWTH 5 DAYS Performed at [facility], [place] [address] [place], [place] [address]    Report Status 01/19/2018 FINAL  Final     Studies: No results found.  Scheduled Meds: . allopurinol  150 mg Oral QODAY  . amiodarone  100 mg Oral Daily  . Chlorhexidine Gluconate Cloth  6 each Topical Q0600  . darbepoetin (ARANESP) injection - DIALYSIS  60 mcg Intravenous Q Wed-HD  . doxercalciferol  2 mcg Intravenous Q M,W,F-HD  . ferric citrate  210 mg Oral TID WC  . midodrine  10 mg Oral TID WC  . mirtazapine  7.5 mg Oral QHS  . multivitamin  1 tablet Oral QHS  . oxybutynin  5 mg Oral TID  . pantoprazole  40 mg Oral BID  . sodium chloride flush  10-40 mL Intracatheter Q12H    Continuous Infusions: . sodium chloride Stopped (01/22/18 0041)  . sodium chloride    . sodium chloride    . ceFEPime (MAXIPIME) IV Stopped (01/21/18 2213)  . dextrose 10 mL/hr at 01/17/18 [place] MD, [facility]  If 7PM-7AM, please contact night-coverage at [email or web address], password [facility] 01/22/2018, 4:11 PM  LOS: 9 days

## 2018-01-22 NOTE — Progress Notes (Signed)
Subjective:   Feeling ok today - states feels bloated.  UF 3.5L yesterday.  Post weight 111.2kg per pt (not in chart).   Objective Vital signs in last 24 hours: Vitals:   01/21/18 2005 01/22/18 0042 01/22/18 0432 01/22/18 0751  BP: (!) 81/57 (!) 77/58 (!) 86/61   Pulse: 95 100 95 95  Resp: (!) 23 (!) 24 (!) 21 (!) 23  Temp:  98.6 F (37 C) 98.3 F (36.8 C) 98.1 F (36.7 C)  TempSrc:  Oral Oral   SpO2: 99% 95% 95% 100%  Weight:      Height:       Weight change: -2.9 kg  Physical Exam: General:  Obese WDWN male in bedside chair NAD  Heart: RRR  1/6 sem  No rub or gallop Lungs: CTA , non labored breathing  Abdomen: obese, nontender  Extremities: trace ankel edema  Dialysis Access: Pos bruit  LUA AVF   OP [facility]  4h 111.5kg 2/2.25 bath P4 LUE AVF Hep 7000 +3000 midrun Hectorol 66mcg IV TIW Parsabiv 12.5mg  IV TIW  Problem/Plan: 1. L renal hemorrhage - s/p L renal artery embolization by IR 8/23. Urology following - conservative mgmt.  Encouraging that his Hb has stabilized and clinically feeling improved.  2. Hypotension -  On midodrine at  10 mg  TID, BP has been tolerating HD with good amount of UF.  3. Hyperkalemia -resovled 4. ESRD -MWF HD.  HD tomorrow.  EDW 111.5 and post weight yesterday 111.2kg -- poor appetite, has lost body weight; challenge tomorrow - 3L tentatively.  Needs new EDW at DC. Still holding heparin with HD in light of recent severe bleed.   5. Anemia of CKD/ABL - has rec'd multiple pRBC transfusions this admission.  Hb finally appears to have stabilized. Aranesp give 60 mcg q wed hd started 01/18/18 . Iron sat 18% but in light of hematoma and multiple pRBC will hold on IV iron.  6. Metabolic bone disease -Ca ok. Phos  7.3 >5.8>4.8. Auryxia, last phos 3.7. Continue Hectorol.No parsabiv here.  7. Aflutter - on amiodarone. Coumadin on hold.  8. Hx RCC s/p right nephrectomy 2002 9. NICM EF 30-35% working towards euvolemia with UF at  HD  Jannifer Hick MD [facility] 01/22/2018,9:56 AM  LOS: 9 days   Labs: Basic Metabolic Panel: Recent Labs  Lab 01/18/18 1238 01/19/18 0700 01/20/18 0701 01/21/18 0936  NA 133* 134* 134* 131*  K 5.0 4.3 4.0 3.7  CL 98 97* 95* 95*  CO2 25 27 28 26   GLUCOSE 104* 114* 120* 110*  BUN 38* 26* 22* 32*  CREATININE 11.47* 8.48* 6.67* 7.46*  CALCIUM 8.7* 8.6* 9.1 9.5  PHOS 4.8*  --  3.4 3.7   Liver Function Tests: Recent Labs  Lab 01/18/18 1238 01/20/18 0701 01/21/18 0936  ALBUMIN 2.1* 2.6* 2.5*   No results for input(s): LIPASE, AMYLASE in the last 168 hours. No results for input(s): AMMONIA in the last 168 hours. CBC: Recent Labs  Lab 01/18/18 0524 01/18/18 2014 01/19/18 0435 01/20/18 0701 01/21/18 0936  WBC 6.9 8.8 8.1 9.9 9.8  NEUTROABS  --   --   --   --  7.2  HGB 6.7* 8.9* 7.9* 8.8* 8.5*  HCT 21.2* 26.6* 24.4* 27.4* 26.9*  MCV 101.0* 94.7 97.6 97.9 98.9  PLT 241 255 242 266 248   Cardiac Enzymes: No results for input(s): CKTOTAL, CKMB, CKMBINDEX, TROPONINI in the last 168 hours. CBG: Recent Labs  Lab 01/21/18 1725  01/21/18 2002 01/22/18 0038 01/22/18 0429 01/22/18 0751  GLUCAP 85 123* 97 79 84    Studies/Results: No results found. Medications: . sodium chloride Stopped (01/22/18 0041)  . sodium chloride    . sodium chloride    . ceFEPime (MAXIPIME) IV Stopped (01/21/18 2213)  . dextrose 10 mL/hr at 01/17/18 1546   . allopurinol  150 mg Oral QODAY  . amiodarone  100 mg Oral Daily  . darbepoetin (ARANESP) injection - DIALYSIS  60 mcg Intravenous Q Wed-HD  . doxercalciferol  2 mcg Intravenous Q M,W,F-HD  . ferric citrate  210 mg Oral TID WC  . midodrine  10 mg Oral TID WC  . mirtazapine  7.5 mg Oral QHS  . multivitamin  1 tablet Oral QHS  . oxybutynin  5 mg Oral TID  . pantoprazole  40 mg Oral BID  . sodium chloride flush  10-40 mL Intracatheter Q12H

## 2018-01-22 NOTE — Progress Notes (Signed)
   Subjective/Chief Complaint:  1- Large left Renal Bleed / Hematoma - s/p left IR renal artery embolization 01/13/18 for hemodynamically significant bleed from likely left renal cyst / due to ESRD changes.  Recent course:  8/30- hgb 8.8 8/31 - hgb 8.5 9/1 - hgb 9.5  2 - End Stage Renal Disease / Solitary Left Kidney -  Hemodialysis MWF.   Today "[name]" is stable. Mild left flank pain controlled on PRN's. Hgb remains acceptable.    Objective: Vital signs in last 24 hours: Temp:  [97.9 F (36.6 C)-98.6 F (37 C)] 98.1 F (36.7 C) (09/01 0751) Pulse Rate:  [61-100] 95 (09/01 0751) Resp:  [21-24] 23 (09/01 0751) BP: (77-92)/(45-64) 86/61 (09/01 0432) SpO2:  [95 %-100 %] 100 % (09/01 0751) Last BM Date: 01/21/18  Intake/Output from previous day: 08/31 0701 - 09/01 0700 In: 120.1 [I.V.:20.1; IV Piggyback:100] Out: 3500  Intake/Output this shift: No intake/output data recorded.  General appearance: alert, cooperative and reciving sponge bath during exam.  Eyes: conjunctivae/corneas clear. PERRL, EOM's intact. Fundi benign. Nose: Nares normal. Septum midline. Mucosa normal. No drainage or sinus tenderness. Throat: lips, mucosa, and tongue normal; teeth and gums normal Neck: supple, symmetrical, trachea midline Back: symmetric, no curvature. ROM normal. No CVA tenderness., no flanke eccymoses Resp: non-labored on room air.  GI: soft, non-tender; bowel sounds normal; no masses,  no organomegaly Extremities: extremities normal, atraumatic, no cyanosis or edema Pulses: 2+ and symmetric Lymph nodes: Cervical, supraclavicular, and axillary nodes normal. Neurologic: Grossly normal  Lab Results:  Recent Labs    01/20/18 0701 01/21/18 0936 01/22/18 0853  WBC 9.9 9.8  --   HGB 8.8* 8.5* 9.5*  HCT 27.4* 26.9* 29.1*  PLT 266 248  --    BMET Recent Labs    01/21/18 0936 01/22/18 0853  NA 131* 132*  K 3.7 4.3  CL 95* 96*  CO2 26 23  GLUCOSE 110* 103*  BUN 32* 53*   CREATININE 7.46* 10.56*  CALCIUM 9.5 10.4*   PT/INR No results for input(s): LABPROT, INR in the last 72 hours. ABG No results for input(s): PHART, HCO3 in the last 72 hours.  Invalid input(s): PCO2, PO2  Studies/Results: No results found.  Anti-infectives: Anti-infectives (From admission, onward)   Start     Dose/Rate Route Frequency Ordered Stop   01/16/18 2200  ceFEPIme (MAXIPIME) 1 g in sodium chloride 0.9 % 100 mL IVPB     1 g 200 mL/hr over 30 Minutes Intravenous Every 24 hours 01/15/18 0937 01/22/18 2359   01/15/18 0945  ceFEPIme (MAXIPIME) 2 g in sodium chloride 0.9 % 100 mL IVPB     2 g 200 mL/hr over 30 Minutes Intravenous  Once 01/15/18 2035 01/15/18 1134      Assessment/Plan:  1- Large left Renal Bleed / Hematoma - hgb now stabilizing s/p embolization. This is fortunate. At this point, no activity restrictions. Some low grade fever and flank pain is to be expected c/w post-embolization.   2 - End Stage Renal Disease / Solitary Left Kidney -  Appreciate nephrology comanagment in house. unfortunately his left renal embolization will likely affect his volume status some going forwards.  Will follow, please call with questions.   [facility], Colleen Donahoe 01/22/2018

## 2018-01-23 LAB — CBC
HCT: 26.2 % — ABNORMAL LOW (ref 39.0–52.0)
Hemoglobin: 8.6 g/dL — ABNORMAL LOW (ref 13.0–17.0)
MCH: 31.2 pg (ref 26.0–34.0)
MCHC: 32.8 g/dL (ref 30.0–36.0)
MCV: 94.9 fL (ref 78.0–100.0)
Platelets: 275 10*3/uL (ref 150–400)
RBC: 2.76 MIL/uL — ABNORMAL LOW (ref 4.22–5.81)
RDW: 15.3 % (ref 11.5–15.5)
WBC: 9.4 10*3/uL (ref 4.0–10.5)

## 2018-01-23 LAB — RENAL FUNCTION PANEL
Albumin: 2.5 g/dL — ABNORMAL LOW (ref 3.5–5.0)
Anion gap: 14 (ref 5–15)
BUN: 67 mg/dL — ABNORMAL HIGH (ref 6–20)
CO2: 22 mmol/L (ref 22–32)
Calcium: 10.1 mg/dL (ref 8.9–10.3)
Chloride: 96 mmol/L — ABNORMAL LOW (ref 98–111)
Creatinine, Ser: 12.6 mg/dL — ABNORMAL HIGH (ref 0.61–1.24)
GFR calc Af Amer: 5 mL/min — ABNORMAL LOW (ref >60)
GFR calc non Af Amer: 4 mL/min — ABNORMAL LOW (ref >60)
Glucose, Bld: 86 mg/dL (ref 70–99)
Phosphorus: 5.6 mg/dL — ABNORMAL HIGH (ref 2.5–4.6)
Potassium: 5.1 mmol/L (ref 3.5–5.1)
Sodium: 132 mmol/L — ABNORMAL LOW (ref 135–145)

## 2018-01-23 LAB — HEMOGLOBIN AND HEMATOCRIT, BLOOD
HCT: 26.7 % — ABNORMAL LOW (ref 39.0–52.0)
Hemoglobin: 8.8 g/dL — ABNORMAL LOW (ref 13.0–17.0)

## 2018-01-23 LAB — GLUCOSE, CAPILLARY
Glucose-Capillary: 108 mg/dL — ABNORMAL HIGH (ref 70–99)
Glucose-Capillary: 109 mg/dL — ABNORMAL HIGH (ref 70–99)
Glucose-Capillary: 72 mg/dL (ref 70–99)
Glucose-Capillary: 79 mg/dL (ref 70–99)
Glucose-Capillary: 95 mg/dL (ref 70–99)

## 2018-01-23 MED ORDER — SODIUM CHLORIDE 0.9 % IV SOLN: 100.0000 mL | INTRAVENOUS | Status: DC | PRN

## 2018-01-23 MED ORDER — PENTAFLUOROPROP-TETRAFLUOROETH EX AERO: 1.0000 "application " | INHALATION_SPRAY | CUTANEOUS | Status: DC | PRN

## 2018-01-23 MED ORDER — ALTEPLASE 2 MG IJ SOLR: 2.0000 mg | Freq: Once | INTRAMUSCULAR | Status: DC | PRN

## 2018-01-23 MED ORDER — MIRTAZAPINE 7.5 MG PO TABS: 7.5000 mg | ORAL_TABLET | Freq: Every day | ORAL | 2 refills | Status: DC

## 2018-01-23 MED ORDER — HEPARIN SODIUM (PORCINE) 1000 UNIT/ML DIALYSIS: 1000.0000 [IU] | INTRAMUSCULAR | Status: DC | PRN

## 2018-01-23 MED ORDER — MIDODRINE HCL 5 MG PO TABS
ORAL_TABLET | ORAL | Status: AC
  Administered 2018-01-23: 10 mg
  Filled 2018-01-23: qty 2

## 2018-01-23 MED ORDER — DOXERCALCIFEROL 4 MCG/2ML IV SOLN
INTRAVENOUS | Status: AC
  Administered 2018-01-23: 2 ug
  Filled 2018-01-23: qty 2

## 2018-01-23 MED ORDER — LIDOCAINE-PRILOCAINE 2.5-2.5 % EX CREA: 1.0000 "application " | TOPICAL_CREAM | CUTANEOUS | Status: DC | PRN

## 2018-01-23 MED ORDER — LIDOCAINE HCL (PF) 1 % IJ SOLN: 5.0000 mL | INTRAMUSCULAR | Status: DC | PRN

## 2018-01-23 MED ORDER — MIDODRINE HCL 10 MG PO TABS: 5.0000 mg | ORAL_TABLET | ORAL | 2 refills | Status: DC

## 2018-01-23 NOTE — Discharge Instructions (Signed)
1)Do not restart Coumadin/warfarin yet until seen by your cardiologist and urologist 2)Avoid ibuprofen/Advil/Aleve/Motrin/[place]y Powders/Naproxen/BC powders/Meloxicam/Diclofenac/Indomethacin and other Nonsteroidal anti-inflammatory medications as these will make you more likely to bleed and can cause stomach ulcers, can also cause Kidney problems.  3)You Need Repeat CBC/blood count and/kidney test no later than Monday, 01/30/2018 4) appointment with your urologist within the next 4 to 7 days 5) follow-up with a cardiologist in the next 1 to 2 weeks 6) continue outpatient hemodialysis on Mondays Wednesdays and Fridays

## 2018-01-23 NOTE — Progress Notes (Signed)
Occupational Therapy Treatment Patient Details Name: [name] MRN: [id number] DOB: 08-02-1960 Today's Date: 01/23/2018    History of present illness 57 year old w/ sig h/o ESRD (MWF HD), prior right nephrectomy, a flutter (on amio and warfarin), CHF and chronic hypotension (on midodrine). Usually anuric but went to the BR on 8/22 and voided BRB. DX eval in ER: large renal hematoma 3.5x5cm w/ mild hydro. Hgb 13.6 and INR was 4.59  on arrival. Admitted w/ left renal hemorrhage w/ significant associated flank pain.8/23 While in IR undergoing embolization became progressively hypotensive and tachycardic.    OT comments  Pt educated in energy conservation strategies and given handout. Pt demonstrated ability to don socks without assist. Performed standing grooming and toileting with supervision for safety. Limited session due to pt leaving for HD.Updated d/c disposition to home with intermittent assist. Pt is near his baseline.  Follow Up Recommendations  No OT follow up;Supervision - Intermittent    Equipment Recommendations  3 in 1 bedside commode    Recommendations for Other Services      Precautions / Restrictions Precautions Precautions: Fall       Mobility Bed Mobility Overal bed mobility: Modified Independent                Transfers   Equipment used: None                  Balance     Sitting balance-Leahy Scale: Normal       Standing balance-Leahy Scale: Fair                             ADL either performed or assessed with clinical judgement   ADL Overall ADL's : Needs assistance/impaired     Grooming: Oral care;Standing;Supervision/safety               Lower Body Dressing: Supervision/safety;Sit to/from stand Lower Body Dressing Details (indicate cue type and reason): socks Toilet Transfer: Supervision/safety;Ambulation           Functional mobility during ADLs: Supervision/safety General ADL Comments: pt educated in  energy conservation strategies and gave handout to reinforce     Vision       Perception     Praxis      Cognition Arousal/Alertness: Awake/alert Behavior During Therapy: WFL for tasks assessed/performed Overall Cognitive Status: Within Functional Limits for tasks assessed                                          Exercises     Shoulder Instructions       General Comments      Pertinent Vitals/ Pain       Pain Assessment: No/denies pain  Home Living                                          Prior Functioning/Environment              Frequency  Min 2X/week        Progress Toward Goals  OT Goals(current goals can now be found in the care plan section)  Progress towards OT goals: Progressing toward goals  Acute Rehab OT Goals Patient Stated Goal: to get stronger OT Goal Formulation: With patient  Time For Goal Achievement: 01/31/18 Potential to Achieve Goals: Good  Plan Discharge plan remains appropriate    Co-evaluation                 AM-PAC PT "6 Clicks" Daily Activity     Outcome Measure   Help from another person eating meals?: None Help from another person taking care of personal grooming?: A Little Help from another person toileting, which includes using toliet, bedpan, or urinal?: A Little Help from another person bathing (including washing, rinsing, drying)?: A Little Help from another person to put on and taking off regular upper body clothing?: None Help from another person to put on and taking off regular lower body clothing?: A Little 6 Click Score: 20    End of Session Equipment Utilized During Treatment: Gait belt  OT Visit Diagnosis: Unsteadiness on feet (R26.81);Muscle weakness (generalized) (M62.81)   Activity Tolerance Patient tolerated treatment well   Patient Left in bed(going to HD)   Nurse Communication Mobility status        Time: 0737-1062 OT Time Calculation (min): 10  min  Charges: OT General Charges $OT Visit: 1 Visit OT Treatments $Self Care/Home Management : 8-22 mins  01/23/2018 Nestor Lewandowsky, OTR/L Pager: [phone]  Werner Lean Haze [place] 01/23/2018, 2:44 PM

## 2018-01-23 NOTE — Discharge Summary (Signed)
[name], is a 57 y.o. male  DOB 1961/01/08  MRN [id number].  Admission date:  01/12/2018  Admitting Physician  Etta Quill, DO  Discharge Date:  01/23/2018   Primary MD  Iona Beard, MD  Recommendations for primary care physician for things to follow:   1)Do not restart Coumadin/warfarin yet until seen by her cardiologist and urologist 2)Avoid ibuprofen/Advil/Aleve/Motrin/Goody Powders/Naproxen/BC powders/Meloxicam/Diclofenac/Indomethacin and other Nonsteroidal anti-inflammatory medications as these will make you more likely to bleed and can cause stomach ulcers, can also cause Kidney problems.  3)You Need Repeat CBC/blood count and/kidney test no later than Monday, 01/30/2018 4) appointment with your urologist within the next 4 to 7 days 5) follow-up with a cardiologist in the next 1 to 2 weeks 6) continue outpatient hemodialysis on Mondays Wednesdays and Fridays   Admission Diagnosis  Bleeding [R58] Hemorrhage [R58] Hemorrhage of cyst of native kidney [N28.89, N28.1] Supratherapeutic INR [R79.1] Hemorrhage of left kidney [N28.89]   Discharge Diagnosis  Bleeding [R58] Hemorrhage [R58] Hemorrhage of cyst of native kidney [N28.89, N28.1] Supratherapeutic INR [R79.1] Hemorrhage of left kidney [N28.89]    Principal Problem:   Hemorrhage of left kidney Active Problems:   Systolic CHF, chronic (HCC)   ESRD (end stage renal disease) on dialysis ([place])   Hypotension   History of atrial flutter      Past Medical History:  Diagnosis Date  . Atrial flutter ([place])   . 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 Cardiomyopathy   . CHF (congestive heart failure) ([place])   . Coronary artery disease    normal coronaries by 10/10/08 cath, Cardiac Cath 08-04-12 epic.Dr. Doylene Canard follows  . Diabetes mellitus    pt. states he's borderline diabetic., no longer taking med,- off med. since  2013  . Dialysis patient [facility])    Mon-Wed-Fri([place])- Left AV fistula  . Diverticulitis November 2016   reoccurred in December 2016  . ESRD (end stage renal disease) ([place])   . GERD (gastroesophageal reflux disease)   . Gout   . History of nephrectomy 07/04/2012   History of right nephrectomy in 2002 for renal cell carcinoma   . History of unilateral nephrectomy   . Hypertension   . Low iron   . Myocardial infarction [facility]) ?2006  . Renal cell carcinoma    dialysis- MWF- Dr. Mercy Moore follows.  . Renal insufficiency   . Shortness of breath 05/19/11   "at rest, lying down, w/exertion"  . Stroke [facility]) 02/2011   05/19/11 denies residual  . Umbilical hernia 47/42/59   unrepaired    Past Surgical History:  Procedure Laterality Date  . AV FISTULA PLACEMENT  03/29/2011   Procedure: ARTERIOVENOUS (AV) FISTULA CREATION;  Surgeon: Hinda Lenis, MD;  Location: [place];  Service: Vascular;  Laterality: Left;  LEFT RADIOCEPHALIC , Arteriovenous DGLOVFI([id number])  . CARDIAC CATHETERIZATION  05/21/11  . CARDIAC CATHETERIZATION N/A 03/16/2016   Procedure: Left Heart Cath and Coronary Angiography;  Surgeon: Dixie Dials, MD;  Location:  [place] INVASIVE CV LAB;  Service: Cardiovascular;  Laterality: N/A;  . CHOLECYSTECTOMY N/A 02/12/2016   Procedure: LAPAROSCOPIC CHOLECYSTECTOMY WITH INTRAOPERATIVE CHOLANGIOGRAM;  Surgeon: Jackolyn Confer, MD;  Location: [place];  Service: General;  Laterality: N/A;  . COLONOSCOPY WITH PROPOFOL N/A 02/01/2017   Procedure: COLONOSCOPY WITH PROPOFOL;  Surgeon: Juanita Craver, MD;  Location: [facility] ENDOSCOPY;  Service: Endoscopy;  Laterality: N/A;  . dialysis cath placed    . ESOPHAGOGASTRODUODENOSCOPY (EGD) WITH PROPOFOL N/A 12/02/2015   Procedure: ESOPHAGOGASTRODUODENOSCOPY (EGD) WITH PROPOFOL;  Surgeon: Juanita Craver, MD;  Location: [facility] ENDOSCOPY;  Service: Endoscopy;  Laterality: N/A;  . FINGER SURGERY     L pinkie finger- ORIF- /w remaining hardware - 1990's      . HERNIA REPAIR N/A 8/56/3149   Umbilical hernia repair  . INSERTION OF DIALYSIS CATHETER N/A 01/27/2016   Procedure: INSERTION OF DIALYSIS CATHETER;  Surgeon: Waynetta Sandy, MD;  Location: [place];  Service: Vascular;  Laterality: N/A;  . INSERTION OF MESH N/A 07/04/2012   Procedure: INSERTION OF MESH;  Surgeon: Madilyn Hook, DO;  Location: [place];  Service: General;  Laterality: N/A;  . IR EMBO ART  VEN HEMORR LYMPH EXTRAV  INC GUIDE ROADMAPPING  01/13/2018  . IR FLUORO GUIDE CV LINE RIGHT  01/13/2018  . IR RENAL SELECTIVE  UNI INC S&I MOD SED  01/13/2018  . IR US GUIDE VASC ACCESS RIGHT  01/13/2018  . LAPAROSCOPIC LYSIS OF ADHESIONS N/A 02/12/2016   Procedure: LAPAROSCOPIC LYSIS OF ADHESIONS;  Surgeon: Jackolyn Confer, MD;  Location: [place];  Service: General;  Laterality: N/A;  . LEFT AND RIGHT HEART CATHETERIZATION WITH CORONARY ANGIOGRAM N/A 08/04/2012   Procedure: LEFT AND RIGHT HEART CATHETERIZATION WITH CORONARY ANGIOGRAM;  Surgeon: Birdie Riddle, MD;  Location: [place] CATH LAB;  Service: Cardiovascular;  Laterality: N/A;  . NEPHRECTOMY  2000   right  . REVISON OF ARTERIOVENOUS FISTULA Left 06/05/2013   Procedure: REVISON OF LEFT RADIOCEPHALIC  ARTERIOVENOUS FISTULA;  Surgeon: Conrad [place], MD;  Location: [place];  Service: Vascular;  Laterality: Left;  . REVISON OF ARTERIOVENOUS FISTULA Left 01/27/2016   Procedure: REVISION OF LEFT UPPER EXTREMITY ARTERIOVENOUS FISTULA;  Surgeon: Waynetta Sandy, MD;  Location: [place];  Service: Vascular;  Laterality: Left;  . REVISON OF ARTERIOVENOUS FISTULA Left 08/03/2016   Procedure: REVISON OF Left arm ARTERIOVENOUS FISTULA;  Surgeon: Elam Dutch, MD;  Location: [place];  Service: Vascular;  Laterality: Left;  . REVISON OF ARTERIOVENOUS FISTULA Left 05/06/2017   Procedure: REVISION PLICATION OF ARTERIOVENOUS FISTULA LEFT ARM;  Surgeon: Rosetta Posner, MD;  Location: [place];  Service: Vascular;  Laterality: Left;  . RIGHT HEART CATHETERIZATION  N/A 05/21/2011   Procedure: RIGHT HEART CATH;  Surgeon: Birdie Riddle, MD;  Location: [facility] CATH LAB;  Service: Cardiovascular;  Laterality: N/A;  . smashed  1990's   "left pinky; have a plate in there"  . UMBILICAL HERNIA REPAIR N/A 07/04/2012   Procedure: LAPAROSCOPIC UMBILICAL HERNIA;  Surgeon: Madilyn Hook, DO;  Location: [place];  Service: General;  Laterality: N/A;  . US ECHOCARDIOGRAPHY  05/20/11       HPI  from the history and physical done on the day of admission:    Chief Complaint: Hematuria  HPI: Erickson Yamashiro is a 57 y.o. male with medical history significant of ESRD dialysis on MWF, A.Flutter on amiodarone and coumadin, CHF, hypotension on midodrine.  Patient hasnt made urine in over 5 years he says.  Today  however he went to bathroom and urinated BRB.  Has had abd pain for past couple of days that has progressed.  Nothing makes better or worse.   ED Course: INR 4.x.  CT abd pelvis shows L renal hemorrhage 3.5x4.5cm.  Mild left hydro with blood in collecting system.  HGB 13.6.  Hasnt urinated more blood since 9:30 PM (now nearly 1:30 AM)  EDP ordered 10mg  vit K and 1u FFP.    Hospital Course:   Brief Summary:  H/o PAF on coumadin, h/o ESRD on HD presented with Spont hemorrhage from left kidney with supertherapeutic INR, s/p vit K, ffp/kcentra Hemorrhagic shock, needing pressor, admitted to ICU, improving , off pressors Critical care/IR/urology/nephrology consulted,    transferred to ICU 01/13/2018 transferred back to hospitalist service on 01/16/2018   Assessment/Plan: Principal Problem:   Hemorrhage of left kidney Active Problems:   Systolic CHF, chronic (HCC)   ESRD (end stage renal disease) on dialysis ([place])   Hypotension   History of atrial flutter  1)Hemorrhagic shock, Spontaneous hemorrhage of left kidney in the setting of supratherapeutic INR-  -H/o RCC s/p  nephrectomy on the right in 2002 -INR 4.59 on presentation, coumadin held, he received vit  k/ffp, kcentra and 2units of prbc on day of admission, he is s/p IR embolization for left renal hemorrhage -required embolization of entire renal blood flow (left main renal artery)to the left kidney emergently on 01/13/18 (s/p left IR renal artery embolization 01/13/18 for hemodynamically significant bleed from likely left renal cyst ), , he required pressors for bp supports, -he is i-he  -case discussed with IR on call Dr Annamaria Boots on 8/27 who reviewed imaging from 8/23 and states left renal artery embolization on 8/23 was successful, no more IR intervention. Advise to discuss case with urology. -case discussed with urology Dr Matilde Sprang who recommend conservation management for now with prn blood transfusion and pain control,  Small volume hematuria from time to time which according to urology notes from 01/21/2018 is actually expected  2)Blood Loss Anemia with H/o Anemia of chronic Disease: -hgb baseline above 10, he is on iron based binder at home, -He is on aranesp injection per nephrology -S/p 2prbc on day of admission on 8/23, prbc x1 unit on 8/27, additional 2 units of packed cells on 01/18/2017, hemoglobin now 8.6,      3)Leukocytosis- From stress vs infection, CXR with consolidation LLL.  procalcitonin 18, Blood culture no growth, mrsa screening negative He is started on cefepime per pharmacy since 8/25, wbc normalized on 8/27, continue cefepime to finish total of 7 days (last dose 01/22/18)   4)ESRD on HD MWF- -hemodialysis is helping to address volume overload status, previous dry weight was 111.5 kg, patient will most likely need new EDW, HD MWF at [facility]. On HD since 05/2010. dialysis through left avf, had extra hemodialysis treatment on 01/19/2018 to address volume overload status, last HD 01/21/2018.  last HD 01/23/2018, follow-up as outpatient for HD on Mondays Wednesdays and Fridays   5)Paroxysmal atrial flutter with prior history of stroke ( no residual per patient,  but with some memory impairment at baseline)---CHA2DS2- VASc score   is = 5 (CVA, HTN, CHF, DM)    Which is  equal to = 7.2  % annual. risk of stroke , -he reports last  INR in the clinic prior to hospitalization was 5.8, Continue to hold Coumadin, if anticoagulation needed may consider Eliquis. Has been on Coumadin for 2 years, need to be cleared  by urology before restarting back on anticoagulation, he does not wants to be back on coumadin if there is another agent we can choose.  He is currently in sinus rhythm on amiodarone - 6)HFrEF/NICM--- patient appears to have had acute on chronic chronic combined diastolic and systolic dysfunction CHF exacerbation with volume overload, last known EF 30%, improved volume status with hemodialysis,, currently addressing volume status with hemodialysis   7)Chronic Hypotension-improved BP overall, has been on midodrine prn prior to hospitalization, Now on scheduled midodrine on HD days   8)Diet controlled diabetes, he was on metformin briefly for two years, has been off diabetic meds since 2013, avoid over aggressive diabetic control due to history of symptomatic hypoglycemia,   9) anticoagulation- Continue to hold Coumadin, if anticoagulation needed may consider Eliquis. Has been on Coumadin for 2 years,   urology states that they would prefer the patient stay off anticoagulation for the next 3 to 5 days if possible, patient to follow-up with cardiology to discuss choice of anticoagulation.  Patient is reluctant to go back on Coumadin or any anticoagulants at this time, patient continues to have some hematuria but urologist states  that this is okay/expected and acceptable After further conversation patient would like to hold off on anticoagulation until he can talk to his cardiologist to make a final determination about restarting back on anticoagulation, he does not wants to be back on coumadin if there is another agent we can choose.  He is currently in sinus  rhythm on amiodarone  10)Anorexia-improving slowly, continue Remeron  nephrology to decide on binder choice.    Consultants:  Critical care  IR  Urology  Nephrology  Procedures:  urgentIR angiogram and embolizationon 8/23  Right femoral line placement on 8/23 by IR, removed 01/20/18  prbc transfusion on 8/23 and on 8/27  Dialysis MWF   Antibiotics:  Cefepime from 8/25, last Tuesday 01/22/18  Discharge Condition: stable  Follow UP   Consults obtained -Urology/cardiology//nephrology  Diet and Activity recommendation:  As advised  Discharge Instructions    Discharge Instructions    ([place]) Call MD:  Anytime you have any of the following symptoms: 1) 3 pound weight gain in 24 hours or 5 pounds in 1 week 2) shortness of breath, with or without a dry hacking cough 3) swelling in the hands, feet or stomach 4) if you have to sleep on extra pillows at night in order to breathe.   Complete by:  As directed    Call MD for:  difficulty breathing, headache or visual disturbances   Complete by:  As directed    Call MD for:  persistant dizziness or light-headedness   Complete by:  As directed    Call MD for:  persistant nausea and vomiting   Complete by:  As directed    Call MD for:  severe uncontrolled pain   Complete by:  As directed    Call MD for:  temperature >100.4   Complete by:  As directed    Diet - low sodium heart healthy   Complete by:  As directed    Discharge instructions   Complete by:  As directed    1)Do not restart Coumadin/warfarin yet until seen by her cardiologist and urologist 2)Avoid ibuprofen/Advil/Aleve/Motrin/Goody Powders/Naproxen/BC powders/Meloxicam/Diclofenac/Indomethacin and other Nonsteroidal anti-inflammatory medications as these will make you more likely to bleed and can cause stomach ulcers, can also cause Kidney problems.  3)You Need Repeat CBC/blood count and/kidney test no later than Monday, 01/30/2018 4) appointment  with your urologist within the next 4 to 7 days 5) follow-up with a cardiologist in the next 1 to 2 weeks 6) continue outpatient hemodialysis on Mondays Wednesdays and Fridays   Increase activity slowly   Complete by:  As directed         Discharge Medications     Allergies as of 01/23/2018      Reactions   Ace Inhibitors Itching, Cough      Medication List    STOP taking these medications   warfarin 10 MG tablet Commonly known as:  COUMADIN     TAKE these medications   allopurinol 300 MG tablet Commonly known as:  ZYLOPRIM Take 150 mg by mouth every other day.   amiodarone 200 MG tablet Commonly known as:  PACERONE Take 0.5 tablets (100 mg total) by mouth daily.   AURYXIA 1 GM 210 MG(Fe) tablet Generic drug:  ferric citrate Take 210 mg by mouth 3 (three) times daily with meals.   lidocaine-prilocaine cream Commonly known as:  EMLA Apply 1 application topically See admin instructions. Apply topically to port access prior to dialysis - Monday, Wednesday, Friday   midodrine 10 MG tablet Commonly known as:  PROAMATINE Take 0.5-1 tablets (5-10 mg total) by mouth See admin instructions. Take one tablet (10 mg) by mouth before dialysis (Monday, Wednesday, Friday), take 1/2 tablet (5 mg) on non-dialysis days (Sunday, Tuesday, Thursday, Saturday) as needed for diastolic blood pressure <75.   mirtazapine 7.5 MG tablet Commonly known as:  REMERON Take 1 tablet (7.5 mg total) by mouth at bedtime.   multivitamin Tabs tablet Take 1 tablet by mouth at bedtime.   nitroGLYCERIN 0.4 MG SL tablet Commonly known as:  NITROSTAT Place 0.4 mg under the tongue every 5 (five) minutes as needed for chest pain.   pantoprazole 40 MG tablet Commonly known as:  PROTONIX Take 40 mg by mouth 2 (two) times daily.       Major procedures and Radiology Reports - PLEASE review detailed and final reports for all details, in brief -    Ct Abdomen Pelvis W Contrast  Result Date:  01/13/2018 CLINICAL DATA:  Abdominal pain, diverticulitis suspected. Dialysis patient with episode of hematuria today, left abdominal pain. EXAM: CT ABDOMEN AND PELVIS WITH CONTRAST TECHNIQUE: Multidetector CT imaging of the abdomen and pelvis was performed using the standard protocol following bolus administration of intravenous contrast. CONTRAST:  122mL ISOVUE-300 IOPAMIDOL (ISOVUE-300) INJECTION 61% COMPARISON:  CT 02/22/2017 FINDINGS: Lower chest: Multi chamber cardiomegaly. Ground-glass and consolidative opacity in the dependent left lower lobe. No pleural fluid. Hepatobiliary: Small cyst in the left hepatic lobe, unchanged. No new hepatic lesion. Postcholecystectomy with small cystic duct remnant. No biliary dilatation. Pancreas: No ductal dilatation or inflammation. Spleen: Normal in size without focal abnormality. Splenule inferiorly. Adrenals/Urinary Tract: Normal adrenal glands. Post right nephrectomy without suspicious soft tissue density in the nephrectomy bed. There is an ovoid heterogeneous region in the upper left kidney measuring 3.5 x 4.5 cm that is new from prior exam, containing a punctate internal hyperdensity, axial image 26 series 3. Adjacent perinephric edema. Mild left hydroureteronephrosis containing high-density material. Otherwise left renal parenchymal atrophy and cystic changes of chronic renal disease. Urinary bladder completely decompressed. Stomach/Bowel: Colonic diverticulosis without diverticulitis. Small colonic stool burden. Normal appendix. No small bowel dilatation, obstruction or inflammation. Stomach is nondistended. Vascular/Lymphatic: Aortic and branch atherosclerosis. No aneurysm. Multiple small retroperitoneal and upper abdominal lymph nodes not enlarged by size criteria. No bulky adenopathy. Reproductive: Prostate is  unremarkable. Other: Fat containing umbilical hernia.  No ascites.  No free air. Musculoskeletal: Degenerative disc disease L4-L5. There are no acute or  suspicious osseous abnormalities. IMPRESSION: 1. New 3.5 x 4.5 cm ovoid heterogeneous hyperdensity in the upper left kidney with surrounding stranding and small central focus of high-density material, concerning for acute bleed. Findings may be hematoma due to elevated INR versus underlying lesion such as renal cell carcinoma with hemorrhage. Additionally there is mild left hydroureteronephrosis with high-density material throughout the renal collecting system and ureter consistent with blood. 2. Colonic diverticulosis without diverticulitis. 3. Patchy opacity in the left lung base likely reactive atelectasis. 4.  Aortic Atherosclerosis (ICD10-I70.0). These results were called by telephone at the time of interpretation on 01/13/2018 at 12:44 am to Dr. Addison Lank , who verbally acknowledged these results. Electronically Signed   By: Jeb Levering M.D.   On: 01/13/2018 00:44   Ir Angiogram Renal Left Selective  Result Date: 01/13/2018 INDICATION: Hypovolemic shock.  Left perinephric hematoma. EXAM: IR RIGHT FLOURO GUIDE CV LINE; IR EMBO ART VEN HEMORR LYMPH EXTRAV INC GUIDE ROADMAPPING; IR RENAL SELECTIVE UNI INC S+I MODERATE SEDATION; IR ULTRASOUND GUIDANCE VASC ACCESS RIGHT MEDICATIONS: None. The antibiotic was administered within 1 hour of the procedure ANESTHESIA/SEDATION: Moderate (conscious) sedation was employed during this procedure. A total of Versed 0.5 mg and Fentanyl 0 mcg was administered intravenously. Moderate Sedation Time: 57 minutes. The patient's level of consciousness and vital signs were monitored continuously by radiology nursing throughout the procedure under my direct supervision. CONTRAST:  66mL OMNIPAQUE IOHEXOL 300 MG/ML  SOLN FLUOROSCOPY TIME:  Fluoroscopy Time: 12 minutes 30 seconds (620 mGy). COMPLICATIONS: None immediate. PROCEDURE: Informed consent was obtained from the patient following explanation of the procedure, risks, benefits and alternatives. The patient understands,  agrees and consents for the procedure. All questions were addressed. A time out was performed prior to the initiation of the procedure. Maximal barrier sterile technique utilized including caps, mask, sterile gowns, sterile gloves, large sterile drape, hand hygiene, and Betadine prep. The right groin was prepped and draped in a sterile fashion. 1% lidocaine was utilized for local anesthesia. Under sonographic guidance, a micropuncture needle was inserted into the right common femoral artery. Sonographic documentation was obtained and the common femoral artery was noted to be patent. A 1 8 wire was up sized to a Bentson. Five French sheath was inserted. A 5 French pigtail catheter was advanced into the abdominal aorta and AP arteriography was performed. The pigtail was exchanged for a five [place] sauce Omni catheter was advanced over the wire into the thoracic aorta. The reverse curve was formed. The catheter was retracted. It was advanced over a Bentson wire into the left renal artery. Angiography was performed. A Renegade microcatheter was advanced through the sauce Omni catheter and over a 016 fathom wire into the peripheral branches of the left renal artery towards the upper pole. Contrast was injected confirming position in in the peripheral left renal arterial tree. Coil embolization was performed. Coiled utilized were 6 mm x 10 cm interlock, 6 mm x 20 cm interlock, 6 mm x 20 cm interlock. Repeat angiography through the microcatheter was performed. This demonstrated some residual flow through the coil pack. Gel-Foam slurry was then injected through the microcatheter until stasis of flow was achieved. Microcatheter was removed. The soft was retracted into the right common iliac artery and right femoral angiography was performed. The catheter was removed. The sheath was flushed and sewn in place. Under sonographic  guidance, a micropuncture needle was inserted into the left common femoral vein and removed over a  018 wire which was up sized to a 3 J. Sonographic documentation was obtained. The right common femoral vein was noted to be patent. A 7 French triple-lumen catheter was advanced over the 035 3 J wire. It was flushed and sewn in place. FINDINGS: Aortic arteriography demonstrates left renal artery anatomy. There is significant narrowing just beyond its takeoff. The right renal artery is not clearly visualized and most likely has been ligated. Selective arteriography of the left renal artery delineates anatomy and demonstrates active extravasation involving the upper pole of the left kidney in multiple locations. Subsequent imaging demonstrates selective injection of third order branches of the left renal artery. Serial imaging demonstrates deposition of coils within the left upper pole renal artery branches extending into the main left renal artery. Final arteriography demonstrates occlusion of the left renal artery. Femoral arteriography delineates right femoral artery anatomy and sheath insertion. Final imaging demonstrates a right common femoral vein triple-lumen central venous catheter with its tip in the right common iliac vein. IMPRESSION: Successful coil embolization of the main left renal artery with stasis of flow and resolution of active extravasation of bleeding from the upper pole of the left kidney. Successful right common femoral vein central venous triple-lumen catheter. Electronically Signed   By: Marybelle Killings M.D.   On: 01/13/2018 13:50   Ir Fluoro Guide Cv Line Right  Result Date: 01/13/2018 INDICATION: Hypovolemic shock.  Left perinephric hematoma. EXAM: IR RIGHT FLOURO GUIDE CV LINE; IR EMBO ART VEN HEMORR LYMPH EXTRAV INC GUIDE ROADMAPPING; IR RENAL SELECTIVE UNI INC S+I MODERATE SEDATION; IR ULTRASOUND GUIDANCE VASC ACCESS RIGHT MEDICATIONS: None. The antibiotic was administered within 1 hour of the procedure ANESTHESIA/SEDATION: Moderate (conscious) sedation was employed during this  procedure. A total of Versed 0.5 mg and Fentanyl 0 mcg was administered intravenously. Moderate Sedation Time: 57 minutes. The patient's level of consciousness and vital signs were monitored continuously by radiology nursing throughout the procedure under my direct supervision. CONTRAST:  53mL OMNIPAQUE IOHEXOL 300 MG/ML  SOLN FLUOROSCOPY TIME:  Fluoroscopy Time: 12 minutes 30 seconds (620 mGy). COMPLICATIONS: None immediate. PROCEDURE: Informed consent was obtained from the patient following explanation of the procedure, risks, benefits and alternatives. The patient understands, agrees and consents for the procedure. All questions were addressed. A time out was performed prior to the initiation of the procedure. Maximal barrier sterile technique utilized including caps, mask, sterile gowns, sterile gloves, large sterile drape, hand hygiene, and Betadine prep. The right groin was prepped and draped in a sterile fashion. 1% lidocaine was utilized for local anesthesia. Under sonographic guidance, a micropuncture needle was inserted into the right common femoral artery. Sonographic documentation was obtained and the common femoral artery was noted to be patent. A 1 8 wire was up sized to a Bentson. Five French sheath was inserted. A 5 French pigtail catheter was advanced into the abdominal aorta and AP arteriography was performed. The pigtail was exchanged for a five [place] sauce Omni catheter was advanced over the wire into the thoracic aorta. The reverse curve was formed. The catheter was retracted. It was advanced over a Bentson wire into the left renal artery. Angiography was performed. A Renegade microcatheter was advanced through the sauce Omni catheter and over a 016 fathom wire into the peripheral branches of the left renal artery towards the upper pole. Contrast was injected confirming position in in the peripheral left renal arterial  tree. Coil embolization was performed. Coiled utilized were 6 mm x 10 cm  interlock, 6 mm x 20 cm interlock, 6 mm x 20 cm interlock. Repeat angiography through the microcatheter was performed. This demonstrated some residual flow through the coil pack. Gel-Foam slurry was then injected through the microcatheter until stasis of flow was achieved. Microcatheter was removed. The soft was retracted into the right common iliac artery and right femoral angiography was performed. The catheter was removed. The sheath was flushed and sewn in place. Under sonographic guidance, a micropuncture needle was inserted into the left common femoral vein and removed over a 018 wire which was up sized to a 3 J. Sonographic documentation was obtained. The right common femoral vein was noted to be patent. A 7 French triple-lumen catheter was advanced over the 035 3 J wire. It was flushed and sewn in place. FINDINGS: Aortic arteriography demonstrates left renal artery anatomy. There is significant narrowing just beyond its takeoff. The right renal artery is not clearly visualized and most likely has been ligated. Selective arteriography of the left renal artery delineates anatomy and demonstrates active extravasation involving the upper pole of the left kidney in multiple locations. Subsequent imaging demonstrates selective injection of third order branches of the left renal artery. Serial imaging demonstrates deposition of coils within the left upper pole renal artery branches extending into the main left renal artery. Final arteriography demonstrates occlusion of the left renal artery. Femoral arteriography delineates right femoral artery anatomy and sheath insertion. Final imaging demonstrates a right common femoral vein triple-lumen central venous catheter with its tip in the right common iliac vein. IMPRESSION: Successful coil embolization of the main left renal artery with stasis of flow and resolution of active extravasation of bleeding from the upper pole of the left kidney. Successful right common  femoral vein central venous triple-lumen catheter. Electronically Signed   By: Marybelle Killings M.D.   On: 01/13/2018 13:50   Ir US Guide Vasc Access Right  Result Date: 01/13/2018 INDICATION: Hypovolemic shock.  Left perinephric hematoma. EXAM: IR RIGHT FLOURO GUIDE CV LINE; IR EMBO ART VEN HEMORR LYMPH EXTRAV INC GUIDE ROADMAPPING; IR RENAL SELECTIVE UNI INC S+I MODERATE SEDATION; IR ULTRASOUND GUIDANCE VASC ACCESS RIGHT MEDICATIONS: None. The antibiotic was administered within 1 hour of the procedure ANESTHESIA/SEDATION: Moderate (conscious) sedation was employed during this procedure. A total of Versed 0.5 mg and Fentanyl 0 mcg was administered intravenously. Moderate Sedation Time: 57 minutes. The patient's level of consciousness and vital signs were monitored continuously by radiology nursing throughout the procedure under my direct supervision. CONTRAST:  58mL OMNIPAQUE IOHEXOL 300 MG/ML  SOLN FLUOROSCOPY TIME:  Fluoroscopy Time: 12 minutes 30 seconds (620 mGy). COMPLICATIONS: None immediate. PROCEDURE: Informed consent was obtained from the patient following explanation of the procedure, risks, benefits and alternatives. The patient understands, agrees and consents for the procedure. All questions were addressed. A time out was performed prior to the initiation of the procedure. Maximal barrier sterile technique utilized including caps, mask, sterile gowns, sterile gloves, large sterile drape, hand hygiene, and Betadine prep. The right groin was prepped and draped in a sterile fashion. 1% lidocaine was utilized for local anesthesia. Under sonographic guidance, a micropuncture needle was inserted into the right common femoral artery. Sonographic documentation was obtained and the common femoral artery was noted to be patent. A 1 8 wire was up sized to a Bentson. Five French sheath was inserted. A 5 French pigtail catheter was advanced into the abdominal aorta and  AP arteriography was performed. The pigtail  was exchanged for a five [place] sauce Omni catheter was advanced over the wire into the thoracic aorta. The reverse curve was formed. The catheter was retracted. It was advanced over a Bentson wire into the left renal artery. Angiography was performed. A Renegade microcatheter was advanced through the sauce Omni catheter and over a 016 fathom wire into the peripheral branches of the left renal artery towards the upper pole. Contrast was injected confirming position in in the peripheral left renal arterial tree. Coil embolization was performed. Coiled utilized were 6 mm x 10 cm interlock, 6 mm x 20 cm interlock, 6 mm x 20 cm interlock. Repeat angiography through the microcatheter was performed. This demonstrated some residual flow through the coil pack. Gel-Foam slurry was then injected through the microcatheter until stasis of flow was achieved. Microcatheter was removed. The soft was retracted into the right common iliac artery and right femoral angiography was performed. The catheter was removed. The sheath was flushed and sewn in place. Under sonographic guidance, a micropuncture needle was inserted into the left common femoral vein and removed over a 018 wire which was up sized to a 3 J. Sonographic documentation was obtained. The right common femoral vein was noted to be patent. A 7 French triple-lumen catheter was advanced over the 035 3 J wire. It was flushed and sewn in place. FINDINGS: Aortic arteriography demonstrates left renal artery anatomy. There is significant narrowing just beyond its takeoff. The right renal artery is not clearly visualized and most likely has been ligated. Selective arteriography of the left renal artery delineates anatomy and demonstrates active extravasation involving the upper pole of the left kidney in multiple locations. Subsequent imaging demonstrates selective injection of third order branches of the left renal artery. Serial imaging demonstrates deposition of coils within  the left upper pole renal artery branches extending into the main left renal artery. Final arteriography demonstrates occlusion of the left renal artery. Femoral arteriography delineates right femoral artery anatomy and sheath insertion. Final imaging demonstrates a right common femoral vein triple-lumen central venous catheter with its tip in the right common iliac vein. IMPRESSION: Successful coil embolization of the main left renal artery with stasis of flow and resolution of active extravasation of bleeding from the upper pole of the left kidney. Successful right common femoral vein central venous triple-lumen catheter. Electronically Signed   By: Marybelle Killings M.D.   On: 01/13/2018 13:50   Dg Chest Port 1 View  Result Date: 01/14/2018 CLINICAL DATA:  Congestive heart failure EXAM: PORTABLE CHEST 1 VIEW COMPARISON:  07/17/2017 FINDINGS: Chronic cardiomegaly. Abnormal density in the left lower chest probably secondary to a combination of pleural fluid and left lower lobe density. This could be simple atelectasis or pneumonia. The right chest is clear. No sign of pulmonary edema. IMPRESSION: Cardiomegaly. Left effusion. Abnormal left base pulmonary density which could be atelectasis and/or pneumonia. Electronically Signed   By: Nelson Chimes M.D.   On: 01/14/2018 13:00   Ct Renal Stone Study  Result Date: 01/13/2018 CLINICAL DATA:  Left renal hemorrhage with increased pain EXAM: CT ABDOMEN AND PELVIS WITHOUT CONTRAST TECHNIQUE: Multidetector CT imaging of the abdomen and pelvis was performed following the standard protocol without oral or IV contrast. COMPARISON:  January 13, 2018 study obtained earlier in the day FINDINGS: Lower chest: There is bibasilar atelectatic change. There are multiple foci coronary artery calcification. There is a small left pleural effusion. Hepatobiliary: There is an 8 mm  probable cyst in the left lobe of the liver. No other focal liver lesions are evident. Gallbladder is absent.  There is no biliary duct dilatation. Pancreas: No pancreatic mass or inflammatory focus. The pancreas is displaced anteriorly due to large amount of hemorrhage within the left perinephric fascia. Spleen: No splenic lesions are identified. Spleen is displaced laterally due to extensive left perirenal hemorrhage. Adrenals/Urinary Tract: Adrenals appear unremarkable. There has been extensive progression of hemorrhage throughout the left kidney and perirenal region. The left kidney is edematous with extensive hemorrhage throughout the left kidney distorting renal architecture. There is extensive soft tissue stranding and increased attenuation fluid throughout the perinephric fascia causing expansion of the perinephric fascia and displacement of adjacent structures. There is mass effect on the adjacent left psoas muscle medially as well as displacement the pancreas anteriorly in the spleen laterally. Hemorrhage tracks throughout the renal parenchyma diffusely, best appreciated on sagittal and coronal images. There are several cystic areas within the left kidney as well as the site of original hematoma in the upper pole region, less well-defined than earlier in the day. The right kidney is absent with surgical clips in the right pararenal region. No mass lesions seen in the right retroperitoneum. Urinary bladder is midline and collapsed. No calculus is seen in the left kidney or ureter. Stomach/Bowel: There is no appreciable bowel wall or mesenteric thickening. Loops of bowel are displaced medially due to the large pararenal hemorrhage on the left. No bowel obstruction. No free air or portal venous air evident. Vascular/Lymphatic: There is aortoiliac atherosclerosis. No aneurysm evident. No adenopathy appreciable. Reproductive: Prostate and seminal vesicles are normal in size and contour. Other: Appendix appears normal. There is a ventral hernia containing only fat, stable. There is fat in each inguinal ring. Note that  there is hemorrhage tracking from the pararenal region along the left retroperitoneum into a small inguinal hernia on the left. No abscess evident in the abdomen pelvis. No ascites. Musculoskeletal: There is degenerative change in the lumbar spine with vacuum phenomenon at L4-5. No blastic or lytic bone lesions. No intramuscular lesions are evident. IMPRESSION: 1. Extensive left perinephric hemorrhage with distortion of the renal parenchyma due to hemorrhage. Hemorrhage extends throughout the pararenal fascia with extension of the pararenal fascia and displacement of adjacent structures as noted. Fluid tracks from the left pararenal fascia inferiorly along the left pelvis to extend into a left inguinal hernia which earlier in the day contained only fat. 2.  Right kidney absent.  No lesions seen in right pararenal fossa. 3. No bowel obstruction. No abscess in the abdomen or pelvis. Appendix appears unremarkable. 4.  There is aortoiliac atherosclerosis. 5.  Small ventral hernia containing only fat. 6.  Small left pleural effusion. Aortic Atherosclerosis (ICD10-I70.0). Critical Value/emergent results were called by telephone at the time of interpretation on 01/13/2018 at 7:52 am to emergency department physician covering for Dr. Alcario Drought, who verbally acknowledged these results. Electronically Signed   By: Lowella Grip III M.D.   On: 01/13/2018 07:52   Ir Embo Art  [place] Guide Roadmapping  Result Date: 01/13/2018 INDICATION: Hypovolemic shock.  Left perinephric hematoma. EXAM: IR RIGHT FLOURO GUIDE CV LINE; IR EMBO ART VEN HEMORR LYMPH EXTRAV INC GUIDE ROADMAPPING; IR RENAL SELECTIVE UNI INC S+I MODERATE SEDATION; IR ULTRASOUND GUIDANCE VASC ACCESS RIGHT MEDICATIONS: None. The antibiotic was administered within 1 hour of the procedure ANESTHESIA/SEDATION: Moderate (conscious) sedation was employed during this procedure. A total of Versed 0.5 mg  and Fentanyl 0 mcg was administered  intravenously. Moderate Sedation Time: 57 minutes. The patient's level of consciousness and vital signs were monitored continuously by radiology nursing throughout the procedure under my direct supervision. CONTRAST:  58mL OMNIPAQUE IOHEXOL 300 MG/ML  SOLN FLUOROSCOPY TIME:  Fluoroscopy Time: 12 minutes 30 seconds (620 mGy). COMPLICATIONS: None immediate. PROCEDURE: Informed consent was obtained from the patient following explanation of the procedure, risks, benefits and alternatives. The patient understands, agrees and consents for the procedure. All questions were addressed. A time out was performed prior to the initiation of the procedure. Maximal barrier sterile technique utilized including caps, mask, sterile gowns, sterile gloves, large sterile drape, hand hygiene, and Betadine prep. The right groin was prepped and draped in a sterile fashion. 1% lidocaine was utilized for local anesthesia. Under sonographic guidance, a micropuncture needle was inserted into the right common femoral artery. Sonographic documentation was obtained and the common femoral artery was noted to be patent. A 1 8 wire was up sized to a Bentson. Five French sheath was inserted. A 5 French pigtail catheter was advanced into the abdominal aorta and AP arteriography was performed. The pigtail was exchanged for a five [place] sauce Omni catheter was advanced over the wire into the thoracic aorta. The reverse curve was formed. The catheter was retracted. It was advanced over a Bentson wire into the left renal artery. Angiography was performed. A Renegade microcatheter was advanced through the sauce Omni catheter and over a 016 fathom wire into the peripheral branches of the left renal artery towards the upper pole. Contrast was injected confirming position in in the peripheral left renal arterial tree. Coil embolization was performed. Coiled utilized were 6 mm x 10 cm interlock, 6 mm x 20 cm interlock, 6 mm x 20 cm interlock. Repeat  angiography through the microcatheter was performed. This demonstrated some residual flow through the coil pack. Gel-Foam slurry was then injected through the microcatheter until stasis of flow was achieved. Microcatheter was removed. The soft was retracted into the right common iliac artery and right femoral angiography was performed. The catheter was removed. The sheath was flushed and sewn in place. Under sonographic guidance, a micropuncture needle was inserted into the left common femoral vein and removed over a 018 wire which was up sized to a 3 J. Sonographic documentation was obtained. The right common femoral vein was noted to be patent. A 7 French triple-lumen catheter was advanced over the 035 3 J wire. It was flushed and sewn in place. FINDINGS: Aortic arteriography demonstrates left renal artery anatomy. There is significant narrowing just beyond its takeoff. The right renal artery is not clearly visualized and most likely has been ligated. Selective arteriography of the left renal artery delineates anatomy and demonstrates active extravasation involving the upper pole of the left kidney in multiple locations. Subsequent imaging demonstrates selective injection of third order branches of the left renal artery. Serial imaging demonstrates deposition of coils within the left upper pole renal artery branches extending into the main left renal artery. Final arteriography demonstrates occlusion of the left renal artery. Femoral arteriography delineates right femoral artery anatomy and sheath insertion. Final imaging demonstrates a right common femoral vein triple-lumen central venous catheter with its tip in the right common iliac vein. IMPRESSION: Successful coil embolization of the main left renal artery with stasis of flow and resolution of active extravasation of bleeding from the upper pole of the left kidney. Successful right common femoral vein central venous triple-lumen catheter. Electronically  Signed   By: Marybelle Killings M.D.   On: 01/13/2018 13:50    Micro Results    Recent Results (from the past 240 hour(s))  Culture, blood (routine x 2)     Status: None   Collection Time: 01/14/18 10:54 AM  Result Value Ref Range Status   Specimen Description BLOOD RIGHT HAND  Final   Special Requests   Final    BOTTLES DRAWN AEROBIC ONLY Blood Culture adequate volume   Culture   Final    NO GROWTH 5 DAYS Performed at [facility], [address], [place], [place] [address]    Report Status 01/19/2018 FINAL  Final  Culture, blood (routine x 2)     Status: None   Collection Time: 01/14/18 11:03 AM  Result Value Ref Range Status   Specimen Description BLOOD RIGHT HAND  Final   Special Requests   Final    BOTTLES DRAWN AEROBIC ONLY Blood Culture results may not be optimal due to an inadequate volume of blood received in culture bottles   Culture   Final    NO GROWTH 5 DAYS Performed at [facility], [place] [address] [place], [place] [address]    Report Status 01/19/2018 FINAL  Final       Today   Subjective    [name] today has no complaints, eager to go home, worried about restarting Coumadin        Patient has been seen and examined prior to discharge   Objective   Blood pressure (!) 92/58, pulse 97, temperature 98 F (36.7 C), temperature source Oral, resp. rate 16, height 6\' 1"  (1.854 m), weight 111.7 kg, SpO2 100 %.   Intake/Output Summary (Last 24 hours) at 01/23/2018 1809 Last data filed at 01/23/2018 1649 Gross per 24 hour  Intake 363.37 ml  Output 2840 ml  Net -2476.63 ml    Exam  Gen:- Awake Alert, in no acute distress HEENT:- [place].AT, No sclera icterus Neck-Supple Neck, +ve JVD,.  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, right femoral line to be removed on 01/20/18, slightly  improving lower extremity edema Psych-affect is appropriate, oriented x3, forgetful at times Neuro-no new focal deficits, no tremors   Data Review   CBC w Diff:  Lab Results  Component Value Date   WBC 9.4 01/23/2018   HGB 8.6 (L) 01/23/2018   HCT 26.2 (L) 01/23/2018   PLT 275 01/23/2018   LYMPHOPCT 8 01/21/2018   MONOPCT 12 01/21/2018   EOSPCT 2 01/21/2018   BASOPCT 1 01/21/2018    CMP:  Lab Results  Component Value Date   NA 132 (L) 01/23/2018   K 5.1 01/23/2018   CL 96 (L) 01/23/2018   CO2 22 01/23/2018   BUN 67 (H) 01/23/2018   CREATININE 12.60 (H) 01/23/2018   PROT 6.3 (L) 01/15/2018   ALBUMIN 2.5 (L) 01/23/2018   BILITOT 1.1 01/15/2018   ALKPHOS 70 01/15/2018   AST 20 01/15/2018   ALT 25 01/15/2018  .   Total Discharge time is about 33 minutes  Roxan Hockey M.D on 01/23/2018 at 6:09 PM  Pager---[phone]  Go to [email or web address] - password [email or web address] for contact info  [facility] - Office  [phone]

## 2018-01-23 NOTE — Consult Note (Signed)
Referring Physician:  Peniel Hass is an 57 y.o. male.                       Chief Complaint: Warfarin use in patient with left renal hemorrhage  HPI: 57 year old male with PMH of paroxysmal atrial flutter, dilated cardiomyopathy, CAD, Type 2 DM, Obesity, ESRD, S/P right nephrectomy for renal cell CA had hemorrhagic shock from left renal cyst hemorrhage. He stabilized with embolectomy of left renal artery following 5-6 units of PRBC transfusion.   Past Medical History:  Diagnosis Date  . Atrial flutter ([place])   . 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 Cardiomyopathy   . CHF (congestive heart failure) ([place])   . Coronary artery disease    normal coronaries by 10/10/08 cath, Cardiac Cath 08-04-12 epic.Dr. Doylene Canard follows  . Diabetes mellitus    pt. states he's borderline diabetic., no longer taking med,- off med. since 2013  . Dialysis patient [facility])    Mon-Wed-Fri([place])- Left AV fistula  . Diverticulitis November 2016   reoccurred in December 2016  . ESRD (end stage renal disease) ([place])   . GERD (gastroesophageal reflux disease)   . Gout   . History of nephrectomy 07/04/2012   History of right nephrectomy in 2002 for renal cell carcinoma   . History of unilateral nephrectomy   . Hypertension   . Low iron   . Myocardial infarction [facility]) ?2006  . Renal cell carcinoma    dialysis- MWF- Dr. Mercy Moore follows.  . Renal insufficiency   . Shortness of breath 05/19/11   "at rest, lying down, w/exertion"  . Stroke [facility]) 02/2011   05/19/11 denies residual  . Umbilical hernia 94/85/46   unrepaired      Past Surgical History:  Procedure Laterality Date  . AV FISTULA PLACEMENT  03/29/2011   Procedure: ARTERIOVENOUS (AV) FISTULA CREATION;  Surgeon: Hinda Lenis, MD;  Location: [place];  Service: Vascular;  Laterality: Left;  LEFT RADIOCEPHALIC , Arteriovenous [id number])  . CARDIAC CATHETERIZATION  05/21/11  . CARDIAC CATHETERIZATION N/A  03/16/2016   Procedure: Left Heart Cath and Coronary Angiography;  Surgeon: Dixie Dials, MD;  Location: [place] CV LAB;  Service: Cardiovascular;  Laterality: N/A;  . CHOLECYSTECTOMY N/A 02/12/2016   Procedure: LAPAROSCOPIC CHOLECYSTECTOMY WITH INTRAOPERATIVE CHOLANGIOGRAM;  Surgeon: Jackolyn Confer, MD;  Location: [place];  Service: General;  Laterality: N/A;  . COLONOSCOPY WITH PROPOFOL N/A 02/01/2017   Procedure: COLONOSCOPY WITH PROPOFOL;  Surgeon: Juanita Craver, MD;  Location: [facility];  Service: Endoscopy;  Laterality: N/A;  . dialysis cath placed    . ESOPHAGOGASTRODUODENOSCOPY (EGD) WITH PROPOFOL N/A 12/02/2015   Procedure: ESOPHAGOGASTRODUODENOSCOPY (EGD) WITH PROPOFOL;  Surgeon: Juanita Craver, MD;  Location: [facility];  Service: Endoscopy;  Laterality: N/A;  . FINGER SURGERY     L pinkie finger- ORIF- /w remaining hardware - 1990's    . HERNIA REPAIR N/A 2/99/3716   Umbilical hernia repair  . INSERTION OF DIALYSIS CATHETER N/A 01/27/2016   Procedure: INSERTION OF DIALYSIS CATHETER;  Surgeon: Waynetta Sandy, MD;  Location: [place];  Service: Vascular;  Laterality: N/A;  . INSERTION OF MESH N/A 07/04/2012   Procedure: INSERTION OF MESH;  Surgeon: Madilyn Hook, DO;  Location: [place];  Service: General;  Laterality: N/A;  . IR EMBO ART  VEN HEMORR LYMPH EXTRAV  INC GUIDE ROADMAPPING  01/13/2018  . IR FLUORO GUIDE CV LINE RIGHT  01/13/2018  . IR RENAL SELECTIVE  UNI INC S&I MOD SED  01/13/2018  . IR US GUIDE VASC ACCESS RIGHT  01/13/2018  . LAPAROSCOPIC LYSIS OF ADHESIONS N/A 02/12/2016   Procedure: LAPAROSCOPIC LYSIS OF ADHESIONS;  Surgeon: Jackolyn Confer, MD;  Location: [place];  Service: General;  Laterality: N/A;  . LEFT AND RIGHT HEART CATHETERIZATION WITH CORONARY ANGIOGRAM N/A 08/04/2012   Procedure: LEFT AND RIGHT HEART CATHETERIZATION WITH CORONARY ANGIOGRAM;  Surgeon: Birdie Riddle, MD;  Location: [place] CATH LAB;  Service: Cardiovascular;  Laterality: N/A;  . NEPHRECTOMY  2000    right  . REVISON OF ARTERIOVENOUS FISTULA Left 06/05/2013   Procedure: REVISON OF LEFT RADIOCEPHALIC  ARTERIOVENOUS FISTULA;  Surgeon: Conrad Waseca, MD;  Location: [place];  Service: Vascular;  Laterality: Left;  . REVISON OF ARTERIOVENOUS FISTULA Left 01/27/2016   Procedure: REVISION OF LEFT UPPER EXTREMITY ARTERIOVENOUS FISTULA;  Surgeon: Waynetta Sandy, MD;  Location: [place];  Service: Vascular;  Laterality: Left;  . REVISON OF ARTERIOVENOUS FISTULA Left 08/03/2016   Procedure: REVISON OF Left arm ARTERIOVENOUS FISTULA;  Surgeon: Elam Dutch, MD;  Location: [place];  Service: Vascular;  Laterality: Left;  . REVISON OF ARTERIOVENOUS FISTULA Left 05/06/2017   Procedure: REVISION PLICATION OF ARTERIOVENOUS FISTULA LEFT ARM;  Surgeon: Rosetta Posner, MD;  Location: [place];  Service: Vascular;  Laterality: Left;  . RIGHT HEART CATHETERIZATION N/A 05/21/2011   Procedure: RIGHT HEART CATH;  Surgeon: Birdie Riddle, MD;  Location: [facility] CATH LAB;  Service: Cardiovascular;  Laterality: N/A;  . smashed  1990's   "left pinky; have a plate in there"  . UMBILICAL HERNIA REPAIR N/A 07/04/2012   Procedure: LAPAROSCOPIC UMBILICAL HERNIA;  Surgeon: Madilyn Hook, DO;  Location: [place];  Service: General;  Laterality: N/A;  . US ECHOCARDIOGRAPHY  05/20/11    Family History  Problem Relation Age of Onset  . Hypertension Mother   . Kidney disease Mother    Social History:  reports that he has never smoked. He has never used smokeless tobacco. He reports that he does not drink alcohol or use drugs.  Allergies:  Allergies  Allergen Reactions  . Ace Inhibitors Itching and Cough    No medications prior to admission.    Results for orders placed or performed during the hospital encounter of 01/12/18 (from the past 48 hour(s))  Glucose, capillary     Status: None   Collection Time: 01/22/18 12:38 AM  Result Value Ref Range   Glucose-Capillary 97 70 - 99 mg/dL  Glucose, capillary     Status: None    Collection Time: 01/22/18  4:29 AM  Result Value Ref Range   Glucose-Capillary 79 70 - 99 mg/dL  Glucose, capillary     Status: None   Collection Time: 01/22/18  7:51 AM  Result Value Ref Range   Glucose-Capillary 84 70 - 99 mg/dL   Comment 1 Document in Chart   Hemoglobin and hematocrit, blood     Status: Abnormal   Collection Time: 01/22/18  8:53 AM  Result Value Ref Range   Hemoglobin 9.5 (L) 13.0 - 17.0 g/dL   HCT 29.1 (L) 39.0 - 52.0 %    Comment: Performed at [facility], [address], [place], [place] [address]  Renal function panel     Status: Abnormal   Collection Time: 01/22/18  8:53 AM  Result Value Ref Range   Sodium 132 (L) 135 - 145 mmol/L   Potassium  4.3 3.5 - 5.1 mmol/L   Chloride 96 (L) 98 - 111 mmol/L   CO2 23 22 - 32 mmol/L   Glucose, Bld 103 (H) 70 - 99 mg/dL   BUN 53 (H) 6 - 20 mg/dL   Creatinine, Ser 10.56 (H) 0.61 - 1.24 mg/dL   Calcium 10.4 (H) 8.9 - 10.3 mg/dL   Phosphorus 5.0 (H) 2.5 - 4.6 mg/dL   Albumin 2.7 (L) 3.5 - 5.0 g/dL   GFR calc non Af Amer 5 (L) >60 mL/min   GFR calc Af Amer 6 (L) >60 mL/min    Comment: (NOTE) The eGFR has been calculated using the CKD EPI equation. This calculation has not been validated in all clinical situations. eGFR's persistently <60 mL/min signify possible Chronic Kidney Disease.    Anion gap 13 5 - 15    Comment: Performed at [facility] [address] [place], [place] [address]  Glucose, capillary     Status: Abnormal   Collection Time: 01/22/18 11:39 AM  Result Value Ref Range   Glucose-Capillary 128 (H) 70 - 99 mg/dL   Comment 1 Document in Chart   Glucose, capillary     Status: None   Collection Time: 01/22/18  4:17 PM  Result Value Ref Range   Glucose-Capillary 90 70 - 99 mg/dL  Glucose, capillary     Status: None   Collection Time: 01/22/18  8:27 PM  Result Value Ref Range   Glucose-Capillary 89 70 - 99 mg/dL  Glucose, capillary     Status: Abnormal   Collection Time: 01/23/18  12:17 AM  Result Value Ref Range   Glucose-Capillary 108 (H) 70 - 99 mg/dL  Glucose, capillary     Status: None   Collection Time: 01/23/18  4:14 AM  Result Value Ref Range   Glucose-Capillary 72 70 - 99 mg/dL  Hemoglobin and hematocrit, blood     Status: Abnormal   Collection Time: 01/23/18  6:29 AM  Result Value Ref Range   Hemoglobin 8.8 (L) 13.0 - 17.0 g/dL   HCT 26.7 (L) 39.0 - 52.0 %    Comment: Performed at [facility], [place]. [address], [place], [place] [address]  Renal function panel     Status: Abnormal   Collection Time: 01/23/18  6:29 AM  Result Value Ref Range   Sodium 132 (L) 135 - 145 mmol/L   Potassium 5.1 3.5 - 5.1 mmol/L   Chloride 96 (L) 98 - 111 mmol/L   CO2 22 22 - 32 mmol/L   Glucose, Bld 86 70 - 99 mg/dL   BUN 67 (H) 6 - 20 mg/dL   Creatinine, Ser 12.60 (H) 0.61 - 1.24 mg/dL   Calcium 10.1 8.9 - 10.3 mg/dL   Phosphorus 5.6 (H) 2.5 - 4.6 mg/dL   Albumin 2.5 (L) 3.5 - 5.0 g/dL   GFR calc non Af Amer 4 (L) >60 mL/min   GFR calc Af Amer 5 (L) >60 mL/min    Comment: (NOTE) The eGFR has been calculated using the CKD EPI equation. This calculation has not been validated in all clinical situations. eGFR's persistently <60 mL/min signify possible Chronic Kidney Disease.    Anion gap 14 5 - 15    Comment: Performed at [facility] [address] [place], [place] [address]  Glucose, capillary     Status: None   Collection Time: 01/23/18  7:57 AM  Result Value Ref Range   Glucose-Capillary 79 70 - 99 mg/dL  Glucose, capillary     Status: None   Collection Time: 01/23/18 12:17 PM  Result Value Ref Range   Glucose-Capillary 95 70 - 99 mg/dL  CBC     Status: Abnormal   Collection Time: 01/23/18  1:01 PM  Result Value Ref Range   WBC 9.4 4.0 - 10.5 K/uL   RBC 2.76 (L) 4.22 - 5.81 MIL/uL   Hemoglobin 8.6 (L) 13.0 - 17.0 g/dL   HCT 26.2 (L) 39.0 - 52.0 %   MCV 94.9 78.0 - 100.0 fL   MCH 31.2 26.0 - 34.0 pg   MCHC 32.8 30.0 - 36.0 g/dL   RDW 15.3  11.5 - 15.5 %   Platelets 275 150 - 400 K/uL    Comment: Performed at [facility], [place] [address], [place], [place] [address]  Glucose, capillary     Status: Abnormal   Collection Time: 01/23/18  3:55 PM  Result Value Ref Range   Glucose-Capillary 109 (H) 70 - 99 mg/dL   No results found.  Review Of Systems Constitutional: No fever, chills, weight loss or gain. Eyes: No vision change, wears glasses. No discharge or pain. Ears: No hearing loss, No tinnitus. Respiratory: No asthma, COPD, pneumonias. No shortness of breath. No hemoptysis. Cardiovascular: No chest pain, palpitation, leg edema. Gastrointestinal: No nausea, vomiting, diarrhea, constipation. No GI bleed. No hepatitis. Genitourinary: No dysuria, hematuria, kidney stone. No incontinance. Neurological: No headache, stroke, seizures.  Psychiatry: No psych facility admission for anxiety, depression, suicide. No detox. Skin: No rash. Musculoskeletal: No joint pain, fibromyalgia. No neck pain, back pain. Lymphadenopathy: No lymphadenopathy. Hematology: No anemia or easy bruising.   Blood pressure (!) 92/58, pulse 97, temperature 98 F (36.7 C), temperature source Oral, resp. rate 16, height '6\' 1"'$  (1.854 m), weight 111.7 kg, SpO2 100 %. Body mass index is 32.49 kg/m. General appearance: alert, cooperative, appears stated age and no distress Head: Normocephalic, atraumatic. Eyes: Brown/Blue/Hazel eyes, pink conjunctiva, corneas clear. PERRL, EOM's intact. Neck: No adenopathy, no carotid bruit, no JVD, supple, symmetrical, trachea midline and thyroid not enlarged. Resp: Clear to auscultation bilaterally. Cardio: Regular rate and rhythm, S1, S2 normal, II/VI systolic murmur, no click, rub or gallop GI: Soft, non-tender; bowel sounds normal; no organomegaly. Extremities: No edema, cyanosis or clubbing. Skin: Warm and dry.  Neurologic: Alert and oriented X 3, normal strength. Normal coordination and  gait.  Assessment/Plan Left renal hemorrhage CAD Dilated cardiomyopathy Type 2 DM ESRD Hypertension Obesity Anemia of acute bleed and chronic disease Paroxysmal atrial flutter  Hold warfarin till has stopped bleeding from left kidney. F/U 1 week to discuss need for anticoagulation  Birdie Riddle, MD  01/23/2018, 9:33 PM

## 2018-01-23 NOTE — Progress Notes (Signed)
Subjective:   No c/o.   Objective Vital signs in last 24 hours: Vitals:   01/23/18 1600 01/23/18 1630 01/23/18 1649 01/23/18 1734  BP: (!) 111/56 107/63 124/60 (!) 92/58  Pulse: 88 90 90 97  Resp:   20 16  Temp:   97.6 F (36.4 C) 98 F (36.7 C)  TempSrc:   Oral Oral  SpO2:    100%  Weight:   111.7 kg   Height:       Weight change:   Physical Exam: General:  Obese WDWN male in bedside chair NAD  Heart: RRR  1/6 sem  No rub or gallop Lungs: CTA , non labored breathing  Abdomen: obese, nontender  Extremities: trace ankel edema  Dialysis Access: Pos bruit  LUA AVF   OP [facility]ialysis[facility]  4h 111.5kg 2/2.25 bath P4 LUE AVF Hep 7000 +3000 midrun Hectorol 58mcg IV TIW Parsabiv 12.5mg  IV TIW  Problem/Plan: 1. L renal hemorrhage - s/p L renal artery embolization by IR 8/23. Urology following.  Pt for dc today.  Hb 8-9 range and stable.  2. Hypotension -  On midodrine at  10 mg  TID, BP has been tolerating 3. Hyperkalemia -resolved 4. ESRD -MWF HD.  Got him down to his usual dry wt today, no change in edw at dc.  Have been holding heparin with HD in light of recent severe bleed but urology told me today that it is OK to give heparin w/ dialysis if its needed.   5. Anemia of CKD/ABL - has rec'd multiple pRBC transfusions (x5) this admission.  Hb finally appears to have stabilized. Aranesp give 60 mcg q wed hd started 01/18/18 . Iron sat 18% but in light of hematoma and multiple pRBC will hold on IV iron.  6. Metabolic bone disease -Ca ok. Phos  7.3 >5.8>4.8. Auryxia, last phos 3.7. Continue Hectorol.No parsabiv here.  7. Aflutter - on amiodarone. Coumadin on hold, I spoke w/ Dr Doylene Canard who talked w/ the patient and he will be in charge of restarting coumadin in the OP setting.  He said he would like the hematuria to resolve and possibly do another CT scan first.  Cardiology will arragne this f/u.  8. Hx RCC s/p right nephrectomy 2002 9. NICM EF 30-35%  Kelly Splinter  MD [employer] pgr [phone]   01/23/2018, 5:55 PM        Labs: Basic Metabolic Panel: Recent Labs  Lab 01/21/18 0936 01/22/18 0853 01/23/18 0629  NA 131* 132* 132*  K 3.7 4.3 5.1  CL 95* 96* 96*  CO2 26 23 22   GLUCOSE 110* 103* 86  BUN 32* 53* 67*  CREATININE 7.46* 10.56* 12.60*  CALCIUM 9.5 10.4* 10.1  PHOS 3.7 5.0* 5.6*   Liver Function Tests: Recent Labs  Lab 01/21/18 0936 01/22/18 0853 01/23/18 0629  ALBUMIN 2.5* 2.7* 2.5*   No results for input(s): LIPASE, AMYLASE in the last 168 hours. No results for input(s): AMMONIA in the last 168 hours. CBC: Recent Labs  Lab 01/18/18 2014 01/19/18 0435 01/20/18 0701 01/21/18 0936 01/22/18 0853 01/23/18 0629 01/23/18 1301  WBC 8.8 8.1 9.9 9.8  --   --  9.4  NEUTROABS  --   --   --  7.2  --   --   --   HGB 8.9* 7.9* 8.8* 8.5* 9.5* 8.8* 8.6*  HCT 26.6* 24.4* 27.4* 26.9* 29.1* 26.7* 26.2*  MCV 94.7 97.6 97.9 98.9  --   --  94.9  PLT 255 242 266 248  --   --  275   Cardiac Enzymes: No results for input(s): CKTOTAL, CKMB, CKMBINDEX, TROPONINI in the last 168 hours. CBG: Recent Labs  Lab 01/23/18 0017 01/23/18 0414 01/23/18 0757 01/23/18 1217 01/23/18 1555  GLUCAP 108* 72 79 95 109*    Studies/Results: No results found. Medications: . sodium chloride Stopped (01/22/18 2202)  . dextrose 10 mL/hr at 01/17/18 1546   . allopurinol  150 mg Oral QODAY  . amiodarone  100 mg Oral Daily  . Chlorhexidine Gluconate Cloth  6 each Topical Q0600  . darbepoetin (ARANESP) injection - DIALYSIS  60 mcg Intravenous Q Wed-HD  . doxercalciferol  2 mcg Intravenous Q M,W,F-HD  . ferric citrate  210 mg Oral TID WC  . midodrine  10 mg Oral TID WC  . mirtazapine  7.5 mg Oral QHS  . multivitamin  1 tablet Oral QHS  . oxybutynin  5 mg Oral TID  . pantoprazole  40 mg Oral BID  . sodium chloride flush  10-40 mL Intracatheter Q12H

## 2018-01-23 NOTE — Progress Notes (Signed)
   Subjective/Chief Complaint:   1- Large left Renal Bleed / Hematoma - s/p left IR renal artery embolization 01/13/18 for hemodynamically significant bleed from likely left renal cyst / due to ESRD changes. On coumadin at baseline for AFib indication. Recent course:  8/30- hgb 8.8 8/31 - hgb 8.5 9/1 - hgb 9.5 9/2 - hgb 8.8  2 - End Stage Renal Disease / Solitary Left Kidney -  Hemodialysis MWF.   Today "[name]" is stable. Left flank pain remains managable. Hgb overall stable x last few days.    Objective: Vital signs in last 24 hours: Temp:  [97.4 F (36.3 C)-99.8 F (37.7 C)] 97.4 F (36.3 C) (09/02 0755) Pulse Rate:  [87-94] 91 (09/02 0924) Resp:  [15-23] 15 (09/02 0924) BP: (79-103)/(56-74) 95/58 (09/02 0924) SpO2:  [97 %-100 %] 98 % (09/02 0924) Last BM Date: 01/21/18  Intake/Output from previous day: 09/01 0701 - 09/02 0700 In: 13.4 [I.V.:13.4] Out: -  Intake/Output this shift: Total I/O In: 350 [P.O.:350] Out: -    General appearance: alert, cooperative, very pleasant.  Eyes: negative Nose: Nares normal. Septum midline. Mucosa normal. No drainage or sinus tenderness. Throat: lips, mucosa, and tongue normal; teeth and gums normal Neck: supple, symmetrical, trachea midline Back: symmetric, no curvature. ROM normal. Minimal left CVA tenderness., no flank eccymoses Resp: non-labored on room air.  GI: soft, non-tender; bowel sounds normal; no masses,  no organomegaly Extremities: extremities normal, atraumatic, no cyanosis or edema Pulses: 2+ and symmetric Lymph nodes: Cervical, supraclavicular, and axillary nodes normal. Neurologic: Grossly normal   Lab Results:  Recent Labs    01/21/18 0936 01/22/18 0853 01/23/18 0629  WBC 9.8  --   --   HGB 8.5* 9.5* 8.8*  HCT 26.9* 29.1* 26.7*  PLT 248  --   --    BMET Recent Labs    01/22/18 0853 01/23/18 0629  NA 132* 132*  K 4.3 5.1  CL 96* 96*  CO2 23 22  GLUCOSE 103* 86  BUN 53* 67*  CREATININE  10.56* 12.60*  CALCIUM 10.4* 10.1   PT/INR No results for input(s): LABPROT, INR in the last 72 hours. ABG No results for input(s): PHART, HCO3 in the last 72 hours.  Invalid input(s): PCO2, PO2  Studies/Results: No results found.  Anti-infectives: Anti-infectives (From admission, onward)   Start     Dose/Rate Route Frequency Ordered Stop   01/16/18 2200  ceFEPIme (MAXIPIME) 1 g in sodium chloride 0.9 % 100 mL IVPB     1 g 200 mL/hr over 30 Minutes Intravenous Every 24 hours 01/15/18 0937 01/22/18 2140   01/15/18 0945  ceFEPIme (MAXIPIME) 2 g in sodium chloride 0.9 % 100 mL IVPB     2 g 200 mL/hr over 30 Minutes Intravenous  Once 01/15/18 1157 01/15/18 1134      Assessment/Plan:   1- Large left Renal Bleed / Hematoma - hgb now stable s/p embolization. This is fortunate. At this point, no activity restrictions. Some low grade fever and flank pain is to be expected c/w post-embolization.   I am OK with restarting coumadin as per cardiology recs at any point, through would prefer another 3-5 days off if possible.   2 - End Stage Renal Disease / Solitary Left Kidney -  Appreciate nephrology comanagment in house. unfortunately his left renal embolization will likely affect his volume status some going forwards.   [facility], [name] 01/23/2018

## 2018-01-24 DIAGNOSIS — N2889 Other specified disorders of kidney and ureter: Secondary | ICD-10-CM | POA: Insufficient documentation

## 2018-01-24 DIAGNOSIS — N281 Cyst of kidney, acquired: Secondary | ICD-10-CM | POA: Insufficient documentation

## 2018-01-24 DIAGNOSIS — R791 Abnormal coagulation profile: Secondary | ICD-10-CM | POA: Insufficient documentation

## 2018-01-24 LAB — GLUCOSE, CAPILLARY: Glucose-Capillary: 101 mg/dL — ABNORMAL HIGH (ref 70–99)

## 2018-01-25 DIAGNOSIS — D631 Anemia in chronic kidney disease: Secondary | ICD-10-CM | POA: Diagnosis not present

## 2018-01-25 DIAGNOSIS — N2581 Secondary hyperparathyroidism of renal origin: Secondary | ICD-10-CM | POA: Diagnosis not present

## 2018-01-25 DIAGNOSIS — N186 End stage renal disease: Secondary | ICD-10-CM | POA: Diagnosis not present

## 2018-01-25 DIAGNOSIS — E1129 Type 2 diabetes mellitus with other diabetic kidney complication: Secondary | ICD-10-CM | POA: Diagnosis not present

## 2018-01-26 ENCOUNTER — Inpatient Hospital Stay (HOSPITAL_COMMUNITY)
Admission: EM | Admit: 2018-01-26 | Discharge: 2018-02-11 | DRG: 166 | Disposition: A | Discharge status: Home / Self Care | Payer: Medicare Other | Attending: Internal Medicine | Admitting: Internal Medicine

## 2018-01-26 ENCOUNTER — Emergency Department (HOSPITAL_COMMUNITY): Payer: Medicare Other

## 2018-01-26 ENCOUNTER — Encounter (HOSPITAL_COMMUNITY): Payer: Self-pay | Admitting: Emergency Medicine

## 2018-01-26 DIAGNOSIS — Z85528 Personal history of other malignant neoplasm of kidney: Secondary | ICD-10-CM

## 2018-01-26 DIAGNOSIS — R0602 Shortness of breath: Secondary | ICD-10-CM

## 2018-01-26 DIAGNOSIS — Z8673 Personal history of transient ischemic attack (TIA), and cerebral infarction without residual deficits: Secondary | ICD-10-CM

## 2018-01-26 DIAGNOSIS — D509 Iron deficiency anemia, unspecified: Secondary | ICD-10-CM | POA: Diagnosis not present

## 2018-01-26 DIAGNOSIS — J810 Acute pulmonary edema: Secondary | ICD-10-CM | POA: Diagnosis not present

## 2018-01-26 DIAGNOSIS — I482 Chronic atrial fibrillation, unspecified: Secondary | ICD-10-CM | POA: Diagnosis present

## 2018-01-26 DIAGNOSIS — M1A9XX0 Chronic gout, unspecified, without tophus (tophi): Secondary | ICD-10-CM | POA: Diagnosis not present

## 2018-01-26 DIAGNOSIS — D62 Acute posthemorrhagic anemia: Secondary | ICD-10-CM | POA: Diagnosis present

## 2018-01-26 DIAGNOSIS — I480 Paroxysmal atrial fibrillation: Secondary | ICD-10-CM | POA: Diagnosis present

## 2018-01-26 DIAGNOSIS — D649 Anemia, unspecified: Secondary | ICD-10-CM | POA: Diagnosis present

## 2018-01-26 DIAGNOSIS — Z905 Acquired absence of kidney: Secondary | ICD-10-CM

## 2018-01-26 DIAGNOSIS — D631 Anemia in chronic kidney disease: Secondary | ICD-10-CM | POA: Diagnosis present

## 2018-01-26 DIAGNOSIS — E440 Moderate protein-calorie malnutrition: Secondary | ICD-10-CM | POA: Diagnosis present

## 2018-01-26 DIAGNOSIS — K219 Gastro-esophageal reflux disease without esophagitis: Secondary | ICD-10-CM | POA: Diagnosis present

## 2018-01-26 DIAGNOSIS — I824Z2 Acute embolism and thrombosis of unspecified deep veins of left distal lower extremity: Secondary | ICD-10-CM | POA: Diagnosis present

## 2018-01-26 DIAGNOSIS — I959 Hypotension, unspecified: Secondary | ICD-10-CM | POA: Diagnosis not present

## 2018-01-26 DIAGNOSIS — J189 Pneumonia, unspecified organism: Secondary | ICD-10-CM | POA: Diagnosis not present

## 2018-01-26 DIAGNOSIS — N2581 Secondary hyperparathyroidism of renal origin: Secondary | ICD-10-CM | POA: Diagnosis not present

## 2018-01-26 DIAGNOSIS — E871 Hypo-osmolality and hyponatremia: Secondary | ICD-10-CM | POA: Diagnosis not present

## 2018-01-26 DIAGNOSIS — I132 Hypertensive heart and chronic kidney disease with heart failure and with stage 5 chronic kidney disease, or end stage renal disease: Secondary | ICD-10-CM | POA: Diagnosis not present

## 2018-01-26 DIAGNOSIS — F329 Major depressive disorder, single episode, unspecified: Secondary | ICD-10-CM | POA: Diagnosis not present

## 2018-01-26 DIAGNOSIS — G4733 Obstructive sleep apnea (adult) (pediatric): Secondary | ICD-10-CM | POA: Diagnosis present

## 2018-01-26 DIAGNOSIS — N186 End stage renal disease: Secondary | ICD-10-CM | POA: Diagnosis present

## 2018-01-26 DIAGNOSIS — I2699 Other pulmonary embolism without acute cor pulmonale: Secondary | ICD-10-CM | POA: Diagnosis not present

## 2018-01-26 DIAGNOSIS — I2510 Atherosclerotic heart disease of native coronary artery without angina pectoris: Secondary | ICD-10-CM | POA: Diagnosis present

## 2018-01-26 DIAGNOSIS — R4182 Altered mental status, unspecified: Secondary | ICD-10-CM | POA: Diagnosis not present

## 2018-01-26 DIAGNOSIS — I4892 Unspecified atrial flutter: Secondary | ICD-10-CM | POA: Diagnosis present

## 2018-01-26 DIAGNOSIS — Y95 Nosocomial condition: Secondary | ICD-10-CM | POA: Diagnosis present

## 2018-01-26 DIAGNOSIS — N2889 Other specified disorders of kidney and ureter: Secondary | ICD-10-CM | POA: Diagnosis not present

## 2018-01-26 DIAGNOSIS — R000 Tachycardia, unspecified: Secondary | ICD-10-CM | POA: Diagnosis not present

## 2018-01-26 DIAGNOSIS — Z6830 Body mass index (BMI) 30.0-30.9, adult: Secondary | ICD-10-CM | POA: Diagnosis not present

## 2018-01-26 DIAGNOSIS — F32A Depression, unspecified: Secondary | ICD-10-CM | POA: Diagnosis present

## 2018-01-26 DIAGNOSIS — K521 Toxic gastroenteritis and colitis: Secondary | ICD-10-CM | POA: Diagnosis not present

## 2018-01-26 DIAGNOSIS — I429 Cardiomyopathy, unspecified: Secondary | ICD-10-CM | POA: Diagnosis present

## 2018-01-26 DIAGNOSIS — R0902 Hypoxemia: Secondary | ICD-10-CM | POA: Diagnosis not present

## 2018-01-26 DIAGNOSIS — J9601 Acute respiratory failure with hypoxia: Secondary | ICD-10-CM | POA: Diagnosis present

## 2018-01-26 DIAGNOSIS — I5042 Chronic combined systolic (congestive) and diastolic (congestive) heart failure: Secondary | ICD-10-CM | POA: Diagnosis not present

## 2018-01-26 DIAGNOSIS — T454X5A Adverse effect of iron and its compounds, initial encounter: Secondary | ICD-10-CM | POA: Diagnosis not present

## 2018-01-26 DIAGNOSIS — E1122 Type 2 diabetes mellitus with diabetic chronic kidney disease: Secondary | ICD-10-CM | POA: Diagnosis present

## 2018-01-26 DIAGNOSIS — J181 Lobar pneumonia, unspecified organism: Secondary | ICD-10-CM | POA: Diagnosis not present

## 2018-01-26 DIAGNOSIS — E872 Acidosis: Secondary | ICD-10-CM | POA: Diagnosis not present

## 2018-01-26 DIAGNOSIS — Z992 Dependence on renal dialysis: Secondary | ICD-10-CM

## 2018-01-26 DIAGNOSIS — I25119 Atherosclerotic heart disease of native coronary artery with unspecified angina pectoris: Secondary | ICD-10-CM | POA: Diagnosis not present

## 2018-01-26 DIAGNOSIS — I82442 Acute embolism and thrombosis of left tibial vein: Secondary | ICD-10-CM | POA: Diagnosis present

## 2018-01-26 DIAGNOSIS — R251 Tremor, unspecified: Secondary | ICD-10-CM | POA: Diagnosis not present

## 2018-01-26 DIAGNOSIS — Z8249 Family history of ischemic heart disease and other diseases of the circulatory system: Secondary | ICD-10-CM

## 2018-01-26 DIAGNOSIS — E669 Obesity, unspecified: Secondary | ICD-10-CM | POA: Diagnosis present

## 2018-01-26 DIAGNOSIS — I252 Old myocardial infarction: Secondary | ICD-10-CM

## 2018-01-26 DIAGNOSIS — Z841 Family history of disorders of kidney and ureter: Secondary | ICD-10-CM

## 2018-01-26 DIAGNOSIS — J188 Other pneumonia, unspecified organism: Secondary | ICD-10-CM | POA: Diagnosis not present

## 2018-01-26 DIAGNOSIS — R319 Hematuria, unspecified: Secondary | ICD-10-CM | POA: Diagnosis present

## 2018-01-26 DIAGNOSIS — K7689 Other specified diseases of liver: Secondary | ICD-10-CM | POA: Diagnosis not present

## 2018-01-26 LAB — BASIC METABOLIC PANEL
Anion gap: 16 — ABNORMAL HIGH (ref 5–15)
BUN: 36 mg/dL — ABNORMAL HIGH (ref 6–20)
CO2: 27 mmol/L (ref 22–32)
Calcium: 10.4 mg/dL — ABNORMAL HIGH (ref 8.9–10.3)
Chloride: 92 mmol/L — ABNORMAL LOW (ref 98–111)
Creatinine, Ser: 8.91 mg/dL — ABNORMAL HIGH (ref 0.61–1.24)
GFR calc Af Amer: 7 mL/min — ABNORMAL LOW (ref >60)
GFR calc non Af Amer: 6 mL/min — ABNORMAL LOW (ref >60)
Glucose, Bld: 94 mg/dL (ref 70–99)
Potassium: 4.6 mmol/L (ref 3.5–5.1)
Sodium: 135 mmol/L (ref 135–145)

## 2018-01-26 LAB — CBC
HCT: 29.6 % — ABNORMAL LOW (ref 39.0–52.0)
Hemoglobin: 9.4 g/dL — ABNORMAL LOW (ref 13.0–17.0)
MCH: 30.9 pg (ref 26.0–34.0)
MCHC: 31.8 g/dL (ref 30.0–36.0)
MCV: 97.4 fL (ref 78.0–100.0)
Platelets: 311 10*3/uL (ref 150–400)
RBC: 3.04 MIL/uL — ABNORMAL LOW (ref 4.22–5.81)
RDW: 15.6 % — ABNORMAL HIGH (ref 11.5–15.5)
WBC: 11.7 10*3/uL — ABNORMAL HIGH (ref 4.0–10.5)

## 2018-01-26 LAB — I-STAT CG4 LACTIC ACID, ED: Lactic Acid, Venous: 2.75 mmol/L (ref 0.5–1.9)

## 2018-01-26 LAB — I-STAT TROPONIN, ED: Troponin i, poc: 0.03 ng/mL (ref 0.00–0.08)

## 2018-01-26 MED ORDER — RENA-VITE PO TABS
1.0000 | ORAL_TABLET | Freq: Every day | ORAL | Status: DC
  Administered 2018-01-27 – 2018-02-10 (×15): 1 via ORAL
  Filled 2018-01-26 (×15): qty 1

## 2018-01-26 MED ORDER — FERRIC CITRATE 1 GM 210 MG(FE) PO TABS
210.0000 mg | ORAL_TABLET | Freq: Three times a day (TID) | ORAL | Status: DC
  Administered 2018-01-27: 210 mg via ORAL
  Filled 2018-01-26 (×2): qty 1

## 2018-01-26 MED ORDER — NITROGLYCERIN 0.4 MG SL SUBL: 0.4000 mg | SUBLINGUAL_TABLET | SUBLINGUAL | Status: DC | PRN

## 2018-01-26 MED ORDER — ZOLPIDEM TARTRATE 5 MG PO TABS
5.0000 mg | ORAL_TABLET | Freq: Every evening | ORAL | Status: DC | PRN
  Administered 2018-01-26: 5 mg via ORAL
  Filled 2018-01-26: qty 1

## 2018-01-26 MED ORDER — ALLOPURINOL 300 MG PO TABS
150.0000 mg | ORAL_TABLET | ORAL | Status: DC
  Administered 2018-01-27 – 2018-02-10 (×9): 150 mg via ORAL
  Filled 2018-01-26 (×14): qty 1

## 2018-01-26 MED ORDER — LIDOCAINE-PRILOCAINE 2.5-2.5 % EX CREA: 1.0000 "application " | TOPICAL_CREAM | CUTANEOUS | Status: DC

## 2018-01-26 MED ORDER — SODIUM CHLORIDE 0.9 % IV SOLN
1.0000 g | INTRAVENOUS | Status: AC
  Administered 2018-01-27 – 2018-02-01 (×6): 1 g via INTRAVENOUS
  Filled 2018-01-26 (×8): qty 1

## 2018-01-26 MED ORDER — HYDRALAZINE HCL 20 MG/ML IJ SOLN: 5.0000 mg | INTRAMUSCULAR | Status: DC | PRN

## 2018-01-26 MED ORDER — VANCOMYCIN HCL 10 G IV SOLR
2000.0000 mg | Freq: Once | INTRAVENOUS | Status: AC
  Administered 2018-01-26: 2000 mg via INTRAVENOUS
  Filled 2018-01-26: qty 2000

## 2018-01-26 MED ORDER — MORPHINE SULFATE (PF) 2 MG/ML IV SOLN
1.0000 mg | INTRAVENOUS | Status: DC | PRN
  Administered 2018-01-26 – 2018-01-28 (×4): 1 mg via INTRAVENOUS
  Filled 2018-01-26 (×4): qty 1

## 2018-01-26 MED ORDER — SODIUM CHLORIDE 0.9 % IV BOLUS
500.0000 mL | Freq: Once | INTRAVENOUS | Status: AC
  Administered 2018-01-26: 500 mL via INTRAVENOUS

## 2018-01-26 MED ORDER — VANCOMYCIN HCL IN DEXTROSE 1-5 GM/200ML-% IV SOLN
1000.0000 mg | INTRAVENOUS | Status: DC
  Filled 2018-01-26: qty 200

## 2018-01-26 MED ORDER — ONDANSETRON HCL 4 MG/2ML IJ SOLN
4.0000 mg | Freq: Three times a day (TID) | INTRAMUSCULAR | Status: DC | PRN
  Administered 2018-01-29 – 2018-02-09 (×5): 4 mg via INTRAVENOUS
  Filled 2018-01-26 (×4): qty 2

## 2018-01-26 MED ORDER — OXYCODONE-ACETAMINOPHEN 5-325 MG PO TABS
1.0000 | ORAL_TABLET | ORAL | Status: DC | PRN
  Administered 2018-01-26 – 2018-01-30 (×14): 1 via ORAL
  Filled 2018-01-26 (×15): qty 1

## 2018-01-26 MED ORDER — AMIODARONE HCL 100 MG PO TABS
100.0000 mg | ORAL_TABLET | Freq: Every day | ORAL | Status: DC
  Administered 2018-01-27 – 2018-02-11 (×16): 100 mg via ORAL
  Filled 2018-01-26 (×16): qty 1

## 2018-01-26 MED ORDER — HYDROMORPHONE HCL 1 MG/ML IJ SOLN
1.0000 mg | Freq: Once | INTRAMUSCULAR | Status: AC
  Administered 2018-01-26: 1 mg via INTRAVENOUS
  Filled 2018-01-26: qty 1

## 2018-01-26 MED ORDER — ACETAMINOPHEN 325 MG PO TABS
650.0000 mg | ORAL_TABLET | Freq: Four times a day (QID) | ORAL | Status: DC | PRN
  Administered 2018-01-27 – 2018-02-09 (×6): 650 mg via ORAL
  Filled 2018-01-26 (×6): qty 2

## 2018-01-26 MED ORDER — CALCIUM ACETATE (PHOS BINDER) 667 MG PO CAPS
2001.0000 mg | ORAL_CAPSULE | Freq: Three times a day (TID) | ORAL | Status: DC
  Administered 2018-01-27: 2001 mg via ORAL
  Filled 2018-01-26: qty 3

## 2018-01-26 MED ORDER — MIDODRINE HCL 5 MG PO TABS
10.0000 mg | ORAL_TABLET | Freq: Three times a day (TID) | ORAL | Status: DC
  Filled 2018-01-26: qty 2

## 2018-01-26 MED ORDER — VANCOMYCIN HCL IN DEXTROSE 1-5 GM/200ML-% IV SOLN: 1000.0000 mg | Freq: Once | INTRAVENOUS | Status: DC

## 2018-01-26 MED ORDER — MIRTAZAPINE 15 MG PO TABS
7.5000 mg | ORAL_TABLET | Freq: Every day | ORAL | Status: DC
  Administered 2018-01-27 – 2018-02-10 (×15): 7.5 mg via ORAL
  Filled 2018-01-26 (×16): qty 1

## 2018-01-26 MED ORDER — IOPAMIDOL (ISOVUE-370) INJECTION 76%
INTRAVENOUS | Status: AC
  Filled 2018-01-26: qty 100

## 2018-01-26 MED ORDER — IOPAMIDOL (ISOVUE-370) INJECTION 76%
100.0000 mL | Freq: Once | INTRAVENOUS | Status: AC | PRN
  Administered 2018-01-26: 100 mL via INTRAVENOUS

## 2018-01-26 MED ORDER — ALBUTEROL SULFATE (2.5 MG/3ML) 0.083% IN NEBU
2.5000 mg | INHALATION_SOLUTION | RESPIRATORY_TRACT | Status: DC | PRN
  Administered 2018-01-26 – 2018-01-27 (×3): 2.5 mg via RESPIRATORY_TRACT
  Filled 2018-01-26 (×3): qty 3

## 2018-01-26 MED ORDER — PANTOPRAZOLE SODIUM 40 MG PO TBEC
40.0000 mg | DELAYED_RELEASE_TABLET | Freq: Two times a day (BID) | ORAL | Status: DC
  Administered 2018-01-27 – 2018-02-11 (×31): 40 mg via ORAL
  Filled 2018-01-26 (×31): qty 1

## 2018-01-26 MED ORDER — DM-GUAIFENESIN ER 30-600 MG PO TB12
1.0000 | ORAL_TABLET | Freq: Two times a day (BID) | ORAL | Status: DC | PRN
  Administered 2018-01-28 – 2018-01-29 (×4): 1 via ORAL
  Filled 2018-01-26 (×5): qty 1

## 2018-01-26 MED ORDER — SODIUM CHLORIDE 0.9 % IV SOLN
2.0000 g | Freq: Once | INTRAVENOUS | Status: AC
  Administered 2018-01-26: 2 g via INTRAVENOUS
  Filled 2018-01-26: qty 2

## 2018-01-26 NOTE — ED Notes (Signed)
IV team at bedside 

## 2018-01-26 NOTE — ED Notes (Signed)
Patient transported to CT 

## 2018-01-26 NOTE — ED Provider Notes (Addendum)
[facility] EMERGENCY DEPARTMENT Provider Note   CSN: [id number] Arrival date & time: 01/26/18  1737     History   Chief Complaint Chief Complaint  Patient presents with  . Shortness of Breath    HPI Scott Vanderveer is a 57 y.o. male.  Patient c/o pain to right mid back, right costal margin, right flank for past couple days. Pain constant, dull, severe, non radiating. Occurs at rest. Worse w breathing. +sob. +increased cough, non productive, but states hurts to cough. +subjective fever. No vomiting or diarrhea. Hx esrd/hd, last dialysis yesterday, normal. States makes no urine at baseline. Recent d/c from hospital 2 days ago w left renal cyst hemorrhage, and had IR procedure for same during hospitalization. No leg pain or swelling. Denies hx dvt or pe. Has been off anticoagulant therapy since recent hospitalization.   The history is provided by the patient and the spouse.  Shortness of Breath  Associated symptoms include a fever and cough. Pertinent negatives include no headaches, no sore throat, no neck pain, no chest pain, no vomiting, no abdominal pain and no rash.    Past Medical History:  Diagnosis Date  . Atrial flutter ([place])   . 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 Cardiomyopathy   . CHF (congestive heart failure) ([place])   . Coronary artery disease    normal coronaries by 10/10/08 cath, Cardiac Cath 08-04-12 epic.Dr. Doylene Canard follows  . Diabetes mellitus    pt. states he's borderline diabetic., no longer taking med,- off med. since 2013  . Dialysis patient [facility])    Mon-Wed-Fri([place])- Left AV fistula  . Diverticulitis November 2016   reoccurred in December 2016  . ESRD (end stage renal disease) ([place])   . GERD (gastroesophageal reflux disease)   . Gout   . History of nephrectomy 07/04/2012   History of right nephrectomy in 2002 for renal cell carcinoma   . History of unilateral nephrectomy   . Hypertension   . Low  iron   . Myocardial infarction [facility]) ?2006  . Renal cell carcinoma    dialysis- MWF- Dr. Mercy Moore follows.  . Renal insufficiency   . Shortness of breath 05/19/11   "at rest, lying down, w/exertion"  . Stroke [facility]) 02/2011   05/19/11 denies residual  . Umbilical hernia 16/96/78   unrepaired    Patient Active Problem List   Diagnosis Date Noted  . History of atrial flutter 01/13/2018  . Hemorrhage of left kidney 01/13/2018  . Acute respiratory failure with hypoxemia ([place]) 07/17/2017  . 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  . Acute on chronic combined systolic and diastolic CHF, NYHA class 4 ([place]) 07/17/2017  . Unresponsiveness 03/16/2017  . Hypotension 03/16/2017  . Paroxysmal atrial flutter ([place]) 03/16/2017  . Chronic pain 03/16/2017  . Leg pain, bilateral 03/16/2017  . Bile leak, postoperative 03/15/2016  . Elevated troponin 03/15/2016  . Rib pain on right side   . Biliary dyskinesia 02/12/2016  . Atrial flutter with rapid ventricular response ([place]) 06/27/2015  . Diverticulitis 04/21/2015  . Acute diverticulitis 04/21/2015  . Diverticulitis of intestine without perforation or abscess without bleeding 04/21/2015  . Pulmonary edema 01/13/2015  . Right upper quadrant abdominal pain 01/13/2015  . Acute respiratory failure with hypoxia ([place]) 01/13/2015  . Diverticulitis large intestine w/o perforation or abscess w/o bleeding 12/18/2014  . Hepatic cyst 12/18/2014  . Aftercare following surgery of the circulatory system, [place] 06/22/2013  . ESRD (end stage  renal disease) on dialysis ([place]) 11/28/2012  . Right sided weakness 10/05/2012  . History of nephrectomy 07/04/2012  . End-stage renal disease on hemodialysis ([place]) 06/04/2011  . Systolic CHF, chronic ([place]) 05/19/2011    Class: Acute  . Hypertension, benign 05/19/2011    Class: Chronic  . DM II (diabetes mellitus, type II), controlled ([place]) 05/19/2011    Class: Chronic  . Gout, chronic 05/19/2011    Past Surgical History:    Procedure Laterality Date  . AV FISTULA PLACEMENT  03/29/2011   Procedure: ARTERIOVENOUS (AV) FISTULA CREATION;  Surgeon: Hinda Lenis, MD;  Location: [facility];  Service: Vascular;  Laterality: Left;  LEFT RADIOCEPHALIC , Arteriovenous [id number])  . CARDIAC CATHETERIZATION  05/21/11  . CARDIAC CATHETERIZATION N/A 03/16/2016   Procedure: Left Heart Cath and Coronary Angiography;  Surgeon: Dixie Dials, MD;  Location: [facility] CV LAB;  Service: Cardiovascular;  Laterality: N/A;  . CHOLECYSTECTOMY N/A 02/12/2016   Procedure: LAPAROSCOPIC CHOLECYSTECTOMY WITH INTRAOPERATIVE CHOLANGIOGRAM;  Surgeon: Jackolyn Confer, MD;  Location: [facility];  Service: General;  Laterality: N/A;  . COLONOSCOPY WITH PROPOFOL N/A 02/01/2017   Procedure: COLONOSCOPY WITH PROPOFOL;  Surgeon: Juanita Craver, MD;  Location: [facility];  Service: Endoscopy;  Laterality: N/A;  . dialysis cath placed    . ESOPHAGOGASTRODUODENOSCOPY (EGD) WITH PROPOFOL N/A 12/02/2015   Procedure: ESOPHAGOGASTRODUODENOSCOPY (EGD) WITH PROPOFOL;  Surgeon: Juanita Craver, MD;  Location: [facility];  Service: Endoscopy;  Laterality: N/A;  . FINGER SURGERY     L pinkie finger- ORIF- /w remaining hardware - 1990's    . HERNIA REPAIR N/A 11/10/5091   Umbilical hernia repair  . INSERTION OF DIALYSIS CATHETER N/A 01/27/2016   Procedure: INSERTION OF DIALYSIS CATHETER;  Surgeon: Waynetta Sandy, MD;  Location: [facility];  Service: Vascular;  Laterality: N/A;  . INSERTION OF MESH N/A 07/04/2012   Procedure: INSERTION OF MESH;  Surgeon: Madilyn Hook, DO;  Location: [place];  Service: General;  Laterality: N/A;  . IR EMBO ART  VEN HEMORR LYMPH EXTRAV  INC GUIDE ROADMAPPING  01/13/2018  . IR FLUORO GUIDE CV LINE RIGHT  01/13/2018  . IR RENAL SELECTIVE  UNI INC S&I MOD SED  01/13/2018  . IR US GUIDE VASC ACCESS RIGHT  01/13/2018  . LAPAROSCOPIC LYSIS OF ADHESIONS N/A 02/12/2016   Procedure: LAPAROSCOPIC LYSIS OF ADHESIONS;  Surgeon: Jackolyn Confer, MD;   Location: [facility];  Service: General;  Laterality: N/A;  . LEFT AND RIGHT HEART CATHETERIZATION WITH CORONARY ANGIOGRAM N/A 08/04/2012   Procedure: LEFT AND RIGHT HEART CATHETERIZATION WITH CORONARY ANGIOGRAM;  Surgeon: Birdie Riddle, MD;  Location: [facility] CATH LAB;  Service: Cardiovascular;  Laterality: N/A;  . NEPHRECTOMY  2000   right  . REVISON OF ARTERIOVENOUS FISTULA Left 06/05/2013   Procedure: REVISON OF LEFT RADIOCEPHALIC  ARTERIOVENOUS FISTULA;  Surgeon: Conrad Cushing, MD;  Location: [facility];  Service: Vascular;  Laterality: Left;  . REVISON OF ARTERIOVENOUS FISTULA Left 01/27/2016   Procedure: REVISION OF LEFT UPPER EXTREMITY ARTERIOVENOUS FISTULA;  Surgeon: Waynetta Sandy, MD;  Location: [facility];  Service: Vascular;  Laterality: Left;  . REVISON OF ARTERIOVENOUS FISTULA Left 08/03/2016   Procedure: REVISON OF Left arm ARTERIOVENOUS FISTULA;  Surgeon: Elam Dutch, MD;  Location: [facility];  Service: Vascular;  Laterality: Left;  . REVISON OF ARTERIOVENOUS FISTULA Left 05/06/2017   Procedure: REVISION PLICATION OF ARTERIOVENOUS FISTULA LEFT ARM;  Surgeon: Rosetta Posner, MD;  Location: [facility];  Service: Vascular;  Laterality:  Left;  . RIGHT HEART CATHETERIZATION N/A 05/21/2011   Procedure: RIGHT HEART CATH;  Surgeon: Birdie Riddle, MD;  Location: [facility] CATH LAB;  Service: Cardiovascular;  Laterality: N/A;  . smashed  1990's   "left pinky; have a plate in there"  . UMBILICAL HERNIA REPAIR N/A 07/04/2012   Procedure: LAPAROSCOPIC UMBILICAL HERNIA;  Surgeon: Madilyn Hook, DO;  Location: [facility];  Service: General;  Laterality: N/A;  . US ECHOCARDIOGRAPHY  05/20/11        Home Medications    Prior to Admission medications   Medication Sig Start Date End Date Taking? Authorizing Provider  allopurinol (ZYLOPRIM) 300 MG tablet Take 150 mg by mouth every other day.    [provider]  amiodarone (PACERONE) 200 MG tablet Take 0.5 tablets (100 mg total) by mouth daily. 07/21/17   Regalado,  Belkys A, MD  ferric citrate (AURYXIA) 1 GM 210 MG(Fe) tablet Take 210 mg by mouth 3 (three) times daily with meals.     [provider]  lidocaine-prilocaine (EMLA) cream Apply 1 application topically See admin instructions. Apply topically to port access prior to dialysis - Monday, Wednesday, Friday 04/08/15   [provider]  midodrine (PROAMATINE) 10 MG tablet Take 0.5-1 tablets (5-10 mg total) by mouth See admin instructions. Take one tablet (10 mg) by mouth before dialysis (Monday, Wednesday, Friday), take 1/2 tablet (5 mg) on non-dialysis days (Sunday, Tuesday, Thursday, Saturday) as needed for diastolic blood pressure <74. 01/23/18   Denton Brick, Courage, MD  mirtazapine (REMERON) 7.5 MG tablet Take 1 tablet (7.5 mg total) by mouth at bedtime. 01/23/18   Roxan Hockey, MD  multivitamin (RENA-VIT) TABS tablet Take 1 tablet by mouth at bedtime.    [provider]  nitroGLYCERIN (NITROSTAT) 0.4 MG SL tablet Place 0.4 mg under the tongue every 5 (five) minutes as needed for chest pain.  05/20/15   [provider]  pantoprazole (PROTONIX) 40 MG tablet Take 40 mg by mouth 2 (two) times daily.    [provider]    Family History Family History  Problem Relation Age of Onset  . Hypertension Mother   . Kidney disease Mother     Social History Social History   Tobacco Use  . Smoking status: Never Smoker  . Smokeless tobacco: Never Used  Substance Use Topics  . Alcohol use: No    Alcohol/week: 0.0 standard drinks  . Drug use: No    Types: Cocaine    Comment: Past hx-none in 20 yrs     Allergies   Ace inhibitors   Review of Systems Review of Systems  Constitutional: Positive for fever.  HENT: Negative for sore throat.   Eyes: Negative for redness.  Respiratory: Positive for cough and shortness of breath.   Cardiovascular: Negative for chest pain.  Gastrointestinal: Negative for abdominal pain and vomiting.  Genitourinary: Positive for  flank pain.  Musculoskeletal: Positive for back pain. Negative for neck pain.  Skin: Negative for rash.  Neurological: Negative for headaches.  Hematological: Does not bruise/bleed easily.  Psychiatric/Behavioral: Negative for confusion.     Physical Exam Updated Vital Signs BP (!) 98/58 (BP Location: Right Arm)   Pulse (!) 107   Temp 99.2 F (37.3 C) (Oral)   Resp 20   Ht 1.854 m (6\' 1" )   Wt 113.4 kg   SpO2 97%   BMI 32.98 kg/m   Physical Exam  Constitutional: He is oriented to person, place, and time. He appears well-developed and well-nourished.  HENT:  Mouth/Throat: Oropharynx is clear and moist.  Eyes: Conjunctivae are normal.  Neck: Neck supple. No tracheal deviation present.  Cardiovascular: Normal rate, regular rhythm, normal heart sounds and intact distal pulses.  Pulmonary/Chest: Effort normal. No accessory muscle usage. No respiratory distress. He has rales. He exhibits no tenderness.  Lower lobe rales bil   Abdominal: Soft. Bowel sounds are normal. He exhibits no distension. There is no tenderness.  Genitourinary:  Genitourinary Comments: No cva tenderness  Musculoskeletal: He exhibits no edema.  TLS spine non tender, aligned.   Neurological: He is alert and oriented to person, place, and time.  Skin: Skin is warm and dry.  No shingles/rash in area of pain.   Psychiatric:  Anxious and uncomfortable appearing.   Nursing note and vitals reviewed.    ED Treatments / Results  Labs (all labs ordered are listed, but only abnormal results are displayed) Results for orders placed or performed during the hospital encounter of 02/54/27  Basic metabolic panel  Result Value Ref Range   Sodium 135 135 - 145 mmol/L   Potassium 4.6 3.5 - 5.1 mmol/L   Chloride 92 (L) 98 - 111 mmol/L   CO2 27 22 - 32 mmol/L   Glucose, Bld 94 70 - 99 mg/dL   BUN 36 (H) 6 - 20 mg/dL   Creatinine, Ser 8.91 (H) 0.61 - 1.24 mg/dL   Calcium 10.4 (H) 8.9 - 10.3 mg/dL   GFR calc non Af  Amer 6 (L) >60 mL/min   GFR calc Af Amer 7 (L) >60 mL/min   Anion gap 16 (H) 5 - 15  CBC  Result Value Ref Range   WBC 11.7 (H) 4.0 - 10.5 K/uL   RBC 3.04 (L) 4.22 - 5.81 MIL/uL   Hemoglobin 9.4 (L) 13.0 - 17.0 g/dL   HCT 29.6 (L) 39.0 - 52.0 %   MCV 97.4 78.0 - 100.0 fL   MCH 30.9 26.0 - 34.0 pg   MCHC 31.8 30.0 - 36.0 g/dL   RDW 15.6 (H) 11.5 - 15.5 %   Platelets 311 150 - 400 K/uL  I-stat troponin, ED  Result Value Ref Range   Troponin i, poc 0.03 0.00 - 0.08 ng/mL   Comment 3           Dg Chest 2 View  Result Date: 01/26/2018 CLINICAL DATA:  Shortness of breath for 1 day EXAM: CHEST - 2 VIEW COMPARISON:  January 14, 2018 FINDINGS: The heart size is enlarged. Mediastinal contour is normal. There is patchy consolidation of left lung base. There is mild deep diffuse increased pulmonary interstitium bilaterally. There is no pulmonary edema. IMPRESSION: Patchy consolidation of left lung base suspicious for pneumonia. Mild interstitial edema. Electronically Signed   By: Abelardo Diesel M.D.   On: 01/26/2018 18:38   Ct Abdomen Pelvis W Contrast  Result Date: 01/13/2018 CLINICAL DATA:  Abdominal pain, diverticulitis suspected. Dialysis patient with episode of hematuria today, left abdominal pain. EXAM: CT ABDOMEN AND PELVIS WITH CONTRAST TECHNIQUE: Multidetector CT imaging of the abdomen and pelvis was performed using the standard protocol following bolus administration of intravenous contrast. CONTRAST:  198mL ISOVUE-300 IOPAMIDOL (ISOVUE-300) INJECTION 61% COMPARISON:  CT 02/22/2017 FINDINGS: Lower chest: Multi chamber cardiomegaly. Ground-glass and consolidative opacity in the dependent left lower lobe. No pleural fluid. Hepatobiliary: Small cyst in the left hepatic lobe, unchanged. No new hepatic lesion. Postcholecystectomy with small cystic duct remnant. No biliary dilatation. Pancreas: No ductal dilatation or inflammation. Spleen: Normal in size without  focal abnormality. Splenule inferiorly.  Adrenals/Urinary Tract: Normal adrenal glands. Post right nephrectomy without suspicious soft tissue density in the nephrectomy bed. There is an ovoid heterogeneous region in the upper left kidney measuring 3.5 x 4.5 cm that is new from prior exam, containing a punctate internal hyperdensity, axial image 26 series 3. Adjacent perinephric edema. Mild left hydroureteronephrosis containing high-density material. Otherwise left renal parenchymal atrophy and cystic changes of chronic renal disease. Urinary bladder completely decompressed. Stomach/Bowel: Colonic diverticulosis without diverticulitis. Small colonic stool burden. Normal appendix. No small bowel dilatation, obstruction or inflammation. Stomach is nondistended. Vascular/Lymphatic: Aortic and branch atherosclerosis. No aneurysm. Multiple small retroperitoneal and upper abdominal lymph nodes not enlarged by size criteria. No bulky adenopathy. Reproductive: Prostate is unremarkable. Other: Fat containing umbilical hernia.  No ascites.  No free air. Musculoskeletal: Degenerative disc disease L4-L5. There are no acute or suspicious osseous abnormalities. IMPRESSION: 1. New 3.5 x 4.5 cm ovoid heterogeneous hyperdensity in the upper left kidney with surrounding stranding and small central focus of high-density material, concerning for acute bleed. Findings may be hematoma due to elevated INR versus underlying lesion such as renal cell carcinoma with hemorrhage. Additionally there is mild left hydroureteronephrosis with high-density material throughout the renal collecting system and ureter consistent with blood. 2. Colonic diverticulosis without diverticulitis. 3. Patchy opacity in the left lung base likely reactive atelectasis. 4.  Aortic Atherosclerosis (ICD10-I70.0). These results were called by telephone at the time of interpretation on 01/13/2018 at 12:44 am to Dr. Addison Lank , who verbally acknowledged these results. Electronically Signed   By: Jeb Levering M.D.   On: 01/13/2018 00:44   Ir Angiogram Renal Left Selective  Result Date: 01/13/2018 INDICATION: Hypovolemic shock.  Left perinephric hematoma. EXAM: IR RIGHT FLOURO GUIDE CV LINE; IR EMBO ART VEN HEMORR LYMPH EXTRAV INC GUIDE ROADMAPPING; IR RENAL SELECTIVE UNI INC S+I MODERATE SEDATION; IR ULTRASOUND GUIDANCE VASC ACCESS RIGHT MEDICATIONS: None. The antibiotic was administered within 1 hour of the procedure ANESTHESIA/SEDATION: Moderate (conscious) sedation was employed during this procedure. A total of Versed 0.5 mg and Fentanyl 0 mcg was administered intravenously. Moderate Sedation Time: 57 minutes. The patient's level of consciousness and vital signs were monitored continuously by radiology nursing throughout the procedure under my direct supervision. CONTRAST:  18mL OMNIPAQUE IOHEXOL 300 MG/ML  SOLN FLUOROSCOPY TIME:  Fluoroscopy Time: 12 minutes 30 seconds (620 mGy). COMPLICATIONS: None immediate. PROCEDURE: Informed consent was obtained from the patient following explanation of the procedure, risks, benefits and alternatives. The patient understands, agrees and consents for the procedure. All questions were addressed. A time out was performed prior to the initiation of the procedure. Maximal barrier sterile technique utilized including caps, mask, sterile gowns, sterile gloves, large sterile drape, hand hygiene, and Betadine prep. The right groin was prepped and draped in a sterile fashion. 1% lidocaine was utilized for local anesthesia. Under sonographic guidance, a micropuncture needle was inserted into the right common femoral artery. Sonographic documentation was obtained and the common femoral artery was noted to be patent. A 1 8 wire was up sized to a Bentson. Five French sheath was inserted. A 5 French pigtail catheter was advanced into the abdominal aorta and AP arteriography was performed. The pigtail was exchanged for a five [place] sauce Omni catheter was advanced over the wire  into the thoracic aorta. The reverse curve was formed. The catheter was retracted. It was advanced over a Bentson wire into the left renal artery. Angiography was performed. A Renegade microcatheter was  advanced through the sauce Omni catheter and over a 016 fathom wire into the peripheral branches of the left renal artery towards the upper pole. Contrast was injected confirming position in in the peripheral left renal arterial tree. Coil embolization was performed. Coiled utilized were 6 mm x 10 cm interlock, 6 mm x 20 cm interlock, 6 mm x 20 cm interlock. Repeat angiography through the microcatheter was performed. This demonstrated some residual flow through the coil pack. Gel-Foam slurry was then injected through the microcatheter until stasis of flow was achieved. Microcatheter was removed. The soft was retracted into the right common iliac artery and right femoral angiography was performed. The catheter was removed. The sheath was flushed and sewn in place. Under sonographic guidance, a micropuncture needle was inserted into the left common femoral vein and removed over a 018 wire which was up sized to a 3 J. Sonographic documentation was obtained. The right common femoral vein was noted to be patent. A 7 French triple-lumen catheter was advanced over the 035 3 J wire. It was flushed and sewn in place. FINDINGS: Aortic arteriography demonstrates left renal artery anatomy. There is significant narrowing just beyond its takeoff. The right renal artery is not clearly visualized and most likely has been ligated. Selective arteriography of the left renal artery delineates anatomy and demonstrates active extravasation involving the upper pole of the left kidney in multiple locations. Subsequent imaging demonstrates selective injection of third order branches of the left renal artery. Serial imaging demonstrates deposition of coils within the left upper pole renal artery branches extending into the main left renal  artery. Final arteriography demonstrates occlusion of the left renal artery. Femoral arteriography delineates right femoral artery anatomy and sheath insertion. Final imaging demonstrates a right common femoral vein triple-lumen central venous catheter with its tip in the right common iliac vein. IMPRESSION: Successful coil embolization of the main left renal artery with stasis of flow and resolution of active extravasation of bleeding from the upper pole of the left kidney. Successful right common femoral vein central venous triple-lumen catheter. Electronically Signed   By: Marybelle Killings M.D.   On: 01/13/2018 13:50   Ir Fluoro Guide Cv Line Right  Result Date: 01/13/2018 INDICATION: Hypovolemic shock.  Left perinephric hematoma. EXAM: IR RIGHT FLOURO GUIDE CV LINE; IR EMBO ART VEN HEMORR LYMPH EXTRAV INC GUIDE ROADMAPPING; IR RENAL SELECTIVE UNI INC S+I MODERATE SEDATION; IR ULTRASOUND GUIDANCE VASC ACCESS RIGHT MEDICATIONS: None. The antibiotic was administered within 1 hour of the procedure ANESTHESIA/SEDATION: Moderate (conscious) sedation was employed during this procedure. A total of Versed 0.5 mg and Fentanyl 0 mcg was administered intravenously. Moderate Sedation Time: 57 minutes. The patient's level of consciousness and vital signs were monitored continuously by radiology nursing throughout the procedure under my direct supervision. CONTRAST:  51mL OMNIPAQUE IOHEXOL 300 MG/ML  SOLN FLUOROSCOPY TIME:  Fluoroscopy Time: 12 minutes 30 seconds (620 mGy). COMPLICATIONS: None immediate. PROCEDURE: Informed consent was obtained from the patient following explanation of the procedure, risks, benefits and alternatives. The patient understands, agrees and consents for the procedure. All questions were addressed. A time out was performed prior to the initiation of the procedure. Maximal barrier sterile technique utilized including caps, mask, sterile gowns, sterile gloves, large sterile drape, hand hygiene, and  Betadine prep. The right groin was prepped and draped in a sterile fashion. 1% lidocaine was utilized for local anesthesia. Under sonographic guidance, a micropuncture needle was inserted into the right common femoral artery. Sonographic documentation was obtained  and the common femoral artery was noted to be patent. A 1 8 wire was up sized to a Bentson. Five French sheath was inserted. A 5 French pigtail catheter was advanced into the abdominal aorta and AP arteriography was performed. The pigtail was exchanged for a five [place] sauce Omni catheter was advanced over the wire into the thoracic aorta. The reverse curve was formed. The catheter was retracted. It was advanced over a Bentson wire into the left renal artery. Angiography was performed. A Renegade microcatheter was advanced through the sauce Omni catheter and over a 016 fathom wire into the peripheral branches of the left renal artery towards the upper pole. Contrast was injected confirming position in in the peripheral left renal arterial tree. Coil embolization was performed. Coiled utilized were 6 mm x 10 cm interlock, 6 mm x 20 cm interlock, 6 mm x 20 cm interlock. Repeat angiography through the microcatheter was performed. This demonstrated some residual flow through the coil pack. Gel-Foam slurry was then injected through the microcatheter until stasis of flow was achieved. Microcatheter was removed. The soft was retracted into the right common iliac artery and right femoral angiography was performed. The catheter was removed. The sheath was flushed and sewn in place. Under sonographic guidance, a micropuncture needle was inserted into the left common femoral vein and removed over a 018 wire which was up sized to a 3 J. Sonographic documentation was obtained. The right common femoral vein was noted to be patent. A 7 French triple-lumen catheter was advanced over the 035 3 J wire. It was flushed and sewn in place. FINDINGS: Aortic arteriography  demonstrates left renal artery anatomy. There is significant narrowing just beyond its takeoff. The right renal artery is not clearly visualized and most likely has been ligated. Selective arteriography of the left renal artery delineates anatomy and demonstrates active extravasation involving the upper pole of the left kidney in multiple locations. Subsequent imaging demonstrates selective injection of third order branches of the left renal artery. Serial imaging demonstrates deposition of coils within the left upper pole renal artery branches extending into the main left renal artery. Final arteriography demonstrates occlusion of the left renal artery. Femoral arteriography delineates right femoral artery anatomy and sheath insertion. Final imaging demonstrates a right common femoral vein triple-lumen central venous catheter with its tip in the right common iliac vein. IMPRESSION: Successful coil embolization of the main left renal artery with stasis of flow and resolution of active extravasation of bleeding from the upper pole of the left kidney. Successful right common femoral vein central venous triple-lumen catheter. Electronically Signed   By: Marybelle Killings M.D.   On: 01/13/2018 13:50   Ir US Guide Vasc Access Right  Result Date: 01/13/2018 INDICATION: Hypovolemic shock.  Left perinephric hematoma. EXAM: IR RIGHT FLOURO GUIDE CV LINE; IR EMBO ART VEN HEMORR LYMPH EXTRAV INC GUIDE ROADMAPPING; IR RENAL SELECTIVE UNI INC S+I MODERATE SEDATION; IR ULTRASOUND GUIDANCE VASC ACCESS RIGHT MEDICATIONS: None. The antibiotic was administered within 1 hour of the procedure ANESTHESIA/SEDATION: Moderate (conscious) sedation was employed during this procedure. A total of Versed 0.5 mg and Fentanyl 0 mcg was administered intravenously. Moderate Sedation Time: 57 minutes. The patient's level of consciousness and vital signs were monitored continuously by radiology nursing throughout the procedure under my direct  supervision. CONTRAST:  62mL OMNIPAQUE IOHEXOL 300 MG/ML  SOLN FLUOROSCOPY TIME:  Fluoroscopy Time: 12 minutes 30 seconds (620 mGy). COMPLICATIONS: None immediate. PROCEDURE: Informed consent was obtained from the patient following  explanation of the procedure, risks, benefits and alternatives. The patient understands, agrees and consents for the procedure. All questions were addressed. A time out was performed prior to the initiation of the procedure. Maximal barrier sterile technique utilized including caps, mask, sterile gowns, sterile gloves, large sterile drape, hand hygiene, and Betadine prep. The right groin was prepped and draped in a sterile fashion. 1% lidocaine was utilized for local anesthesia. Under sonographic guidance, a micropuncture needle was inserted into the right common femoral artery. Sonographic documentation was obtained and the common femoral artery was noted to be patent. A 1 8 wire was up sized to a Bentson. Five French sheath was inserted. A 5 French pigtail catheter was advanced into the abdominal aorta and AP arteriography was performed. The pigtail was exchanged for a five [place] sauce Omni catheter was advanced over the wire into the thoracic aorta. The reverse curve was formed. The catheter was retracted. It was advanced over a Bentson wire into the left renal artery. Angiography was performed. A Renegade microcatheter was advanced through the sauce Omni catheter and over a 016 fathom wire into the peripheral branches of the left renal artery towards the upper pole. Contrast was injected confirming position in in the peripheral left renal arterial tree. Coil embolization was performed. Coiled utilized were 6 mm x 10 cm interlock, 6 mm x 20 cm interlock, 6 mm x 20 cm interlock. Repeat angiography through the microcatheter was performed. This demonstrated some residual flow through the coil pack. Gel-Foam slurry was then injected through the microcatheter until stasis of flow was  achieved. Microcatheter was removed. The soft was retracted into the right common iliac artery and right femoral angiography was performed. The catheter was removed. The sheath was flushed and sewn in place. Under sonographic guidance, a micropuncture needle was inserted into the left common femoral vein and removed over a 018 wire which was up sized to a 3 J. Sonographic documentation was obtained. The right common femoral vein was noted to be patent. A 7 French triple-lumen catheter was advanced over the 035 3 J wire. It was flushed and sewn in place. FINDINGS: Aortic arteriography demonstrates left renal artery anatomy. There is significant narrowing just beyond its takeoff. The right renal artery is not clearly visualized and most likely has been ligated. Selective arteriography of the left renal artery delineates anatomy and demonstrates active extravasation involving the upper pole of the left kidney in multiple locations. Subsequent imaging demonstrates selective injection of third order branches of the left renal artery. Serial imaging demonstrates deposition of coils within the left upper pole renal artery branches extending into the main left renal artery. Final arteriography demonstrates occlusion of the left renal artery. Femoral arteriography delineates right femoral artery anatomy and sheath insertion. Final imaging demonstrates a right common femoral vein triple-lumen central venous catheter with its tip in the right common iliac vein. IMPRESSION: Successful coil embolization of the main left renal artery with stasis of flow and resolution of active extravasation of bleeding from the upper pole of the left kidney. Successful right common femoral vein central venous triple-lumen catheter. Electronically Signed   By: Marybelle Killings M.D.   On: 01/13/2018 13:50   Dg Chest Port 1 View  Result Date: 01/14/2018 CLINICAL DATA:  Congestive heart failure EXAM: PORTABLE CHEST 1 VIEW COMPARISON:  07/17/2017  FINDINGS: Chronic cardiomegaly. Abnormal density in the left lower chest probably secondary to a combination of pleural fluid and left lower lobe density. This could be simple atelectasis  or pneumonia. The right chest is clear. No sign of pulmonary edema. IMPRESSION: Cardiomegaly. Left effusion. Abnormal left base pulmonary density which could be atelectasis and/or pneumonia. Electronically Signed   By: Nelson Chimes M.D.   On: 01/14/2018 13:00   Ct Renal Stone Study  Result Date: 01/13/2018 CLINICAL DATA:  Left renal hemorrhage with increased pain EXAM: CT ABDOMEN AND PELVIS WITHOUT CONTRAST TECHNIQUE: Multidetector CT imaging of the abdomen and pelvis was performed following the standard protocol without oral or IV contrast. COMPARISON:  January 13, 2018 study obtained earlier in the day FINDINGS: Lower chest: There is bibasilar atelectatic change. There are multiple foci coronary artery calcification. There is a small left pleural effusion. Hepatobiliary: There is an 8 mm probable cyst in the left lobe of the liver. No other focal liver lesions are evident. Gallbladder is absent. There is no biliary duct dilatation. Pancreas: No pancreatic mass or inflammatory focus. The pancreas is displaced anteriorly due to large amount of hemorrhage within the left perinephric fascia. Spleen: No splenic lesions are identified. Spleen is displaced laterally due to extensive left perirenal hemorrhage. Adrenals/Urinary Tract: Adrenals appear unremarkable. There has been extensive progression of hemorrhage throughout the left kidney and perirenal region. The left kidney is edematous with extensive hemorrhage throughout the left kidney distorting renal architecture. There is extensive soft tissue stranding and increased attenuation fluid throughout the perinephric fascia causing expansion of the perinephric fascia and displacement of adjacent structures. There is mass effect on the adjacent left psoas muscle medially as well as  displacement the pancreas anteriorly in the spleen laterally. Hemorrhage tracks throughout the renal parenchyma diffusely, best appreciated on sagittal and coronal images. There are several cystic areas within the left kidney as well as the site of original hematoma in the upper pole region, less well-defined than earlier in the day. The right kidney is absent with surgical clips in the right pararenal region. No mass lesions seen in the right retroperitoneum. Urinary bladder is midline and collapsed. No calculus is seen in the left kidney or ureter. Stomach/Bowel: There is no appreciable bowel wall or mesenteric thickening. Loops of bowel are displaced medially due to the large pararenal hemorrhage on the left. No bowel obstruction. No free air or portal venous air evident. Vascular/Lymphatic: There is aortoiliac atherosclerosis. No aneurysm evident. No adenopathy appreciable. Reproductive: Prostate and seminal vesicles are normal in size and contour. Other: Appendix appears normal. There is a ventral hernia containing only fat, stable. There is fat in each inguinal ring. Note that there is hemorrhage tracking from the pararenal region along the left retroperitoneum into a small inguinal hernia on the left. No abscess evident in the abdomen pelvis. No ascites. Musculoskeletal: There is degenerative change in the lumbar spine with vacuum phenomenon at L4-5. No blastic or lytic bone lesions. No intramuscular lesions are evident. IMPRESSION: 1. Extensive left perinephric hemorrhage with distortion of the renal parenchyma due to hemorrhage. Hemorrhage extends throughout the pararenal fascia with extension of the pararenal fascia and displacement of adjacent structures as noted. Fluid tracks from the left pararenal fascia inferiorly along the left pelvis to extend into a left inguinal hernia which earlier in the day contained only fat. 2.  Right kidney absent.  No lesions seen in right pararenal fossa. 3. No bowel  obstruction. No abscess in the abdomen or pelvis. Appendix appears unremarkable. 4.  There is aortoiliac atherosclerosis. 5.  Small ventral hernia containing only fat. 6.  Small left pleural effusion. Aortic Atherosclerosis (ICD10-I70.0). Critical Value/emergent  results were called by telephone at the time of interpretation on 01/13/2018 at 7:52 am to emergency department physician covering for Dr. Alcario Drought, who verbally acknowledged these results. Electronically Signed   By: Lowella Grip III M.D.   On: 01/13/2018 07:52   Ir Embo Art  [place] Guide Roadmapping  Result Date: 01/13/2018 INDICATION: Hypovolemic shock.  Left perinephric hematoma. EXAM: IR RIGHT FLOURO GUIDE CV LINE; IR EMBO ART VEN HEMORR LYMPH EXTRAV INC GUIDE ROADMAPPING; IR RENAL SELECTIVE UNI INC S+I MODERATE SEDATION; IR ULTRASOUND GUIDANCE VASC ACCESS RIGHT MEDICATIONS: None. The antibiotic was administered within 1 hour of the procedure ANESTHESIA/SEDATION: Moderate (conscious) sedation was employed during this procedure. A total of Versed 0.5 mg and Fentanyl 0 mcg was administered intravenously. Moderate Sedation Time: 57 minutes. The patient's level of consciousness and vital signs were monitored continuously by radiology nursing throughout the procedure under my direct supervision. CONTRAST:  47mL OMNIPAQUE IOHEXOL 300 MG/ML  SOLN FLUOROSCOPY TIME:  Fluoroscopy Time: 12 minutes 30 seconds (620 mGy). COMPLICATIONS: None immediate. PROCEDURE: Informed consent was obtained from the patient following explanation of the procedure, risks, benefits and alternatives. The patient understands, agrees and consents for the procedure. All questions were addressed. A time out was performed prior to the initiation of the procedure. Maximal barrier sterile technique utilized including caps, mask, sterile gowns, sterile gloves, large sterile drape, hand hygiene, and Betadine prep. The right groin was prepped and draped in a sterile  fashion. 1% lidocaine was utilized for local anesthesia. Under sonographic guidance, a micropuncture needle was inserted into the right common femoral artery. Sonographic documentation was obtained and the common femoral artery was noted to be patent. A 1 8 wire was up sized to a Bentson. Five French sheath was inserted. A 5 French pigtail catheter was advanced into the abdominal aorta and AP arteriography was performed. The pigtail was exchanged for a five [place] sauce Omni catheter was advanced over the wire into the thoracic aorta. The reverse curve was formed. The catheter was retracted. It was advanced over a Bentson wire into the left renal artery. Angiography was performed. A Renegade microcatheter was advanced through the sauce Omni catheter and over a 016 fathom wire into the peripheral branches of the left renal artery towards the upper pole. Contrast was injected confirming position in in the peripheral left renal arterial tree. Coil embolization was performed. Coiled utilized were 6 mm x 10 cm interlock, 6 mm x 20 cm interlock, 6 mm x 20 cm interlock. Repeat angiography through the microcatheter was performed. This demonstrated some residual flow through the coil pack. Gel-Foam slurry was then injected through the microcatheter until stasis of flow was achieved. Microcatheter was removed. The soft was retracted into the right common iliac artery and right femoral angiography was performed. The catheter was removed. The sheath was flushed and sewn in place. Under sonographic guidance, a micropuncture needle was inserted into the left common femoral vein and removed over a 018 wire which was up sized to a 3 J. Sonographic documentation was obtained. The right common femoral vein was noted to be patent. A 7 French triple-lumen catheter was advanced over the 035 3 J wire. It was flushed and sewn in place. FINDINGS: Aortic arteriography demonstrates left renal artery anatomy. There is significant narrowing  just beyond its takeoff. The right renal artery is not clearly visualized and most likely has been ligated. Selective arteriography of the left renal artery delineates anatomy and demonstrates active extravasation  involving the upper pole of the left kidney in multiple locations. Subsequent imaging demonstrates selective injection of third order branches of the left renal artery. Serial imaging demonstrates deposition of coils within the left upper pole renal artery branches extending into the main left renal artery. Final arteriography demonstrates occlusion of the left renal artery. Femoral arteriography delineates right femoral artery anatomy and sheath insertion. Final imaging demonstrates a right common femoral vein triple-lumen central venous catheter with its tip in the right common iliac vein. IMPRESSION: Successful coil embolization of the main left renal artery with stasis of flow and resolution of active extravasation of bleeding from the upper pole of the left kidney. Successful right common femoral vein central venous triple-lumen catheter. Electronically Signed   By: Marybelle Killings M.D.   On: 01/13/2018 13:50    EKG EKG Interpretation  Date/Time:  Thursday January 26 2018 19:41:22 EDT Ventricular Rate:  104 PR Interval:    QRS Duration: 110 QT Interval:  359 QTC Calculation: 473 R Axis:   4 Text Interpretation:  Sinus tachycardia Left ventricular hypertrophy No significant change since last tracing Confirmed by Lajean Saver [phone]) on 01/26/2018 7:44:45 PM   Radiology Dg Chest 2 View  Result Date: 01/26/2018 CLINICAL DATA:  Shortness of breath for 1 day EXAM: CHEST - 2 VIEW COMPARISON:  January 14, 2018 FINDINGS: The heart size is enlarged. Mediastinal contour is normal. There is patchy consolidation of left lung base. There is mild deep diffuse increased pulmonary interstitium bilaterally. There is no pulmonary edema. IMPRESSION: Patchy consolidation of left lung base suspicious for  pneumonia. Mild interstitial edema. Electronically Signed   By: Abelardo Diesel M.D.   On: 01/26/2018 18:38    Procedures Procedures (including critical care time)  Medications Ordered in ED Medications  HYDROmorphone (DILAUDID) injection 1 mg (has no administration in time range)  ceFEPIme (MAXIPIME) 2 g in sodium chloride 0.9 % 100 mL IVPB (has no administration in time range)  vancomycin (VANCOCIN) 2,000 mg in sodium chloride 0.9 % 500 mL IVPB (has no administration in time range)     Initial Impression / Assessment and Plan / ED Course  I have reviewed the triage vital signs and the nursing notes.  Pertinent labs & imaging results that were available during my care of the patient were reviewed by me and considered in my medical decision making (see chart for details).  Iv ns. Labs. Cxr.   Left pna suggested on xray, however pain is predominantly on right.   Reviewed nursing notes and prior charts for additional history.   Dilaudid 1 mg iv for pain. Cultures. Iv abx.   Pain improved. Iv abx given .  Will consult hospitalists for admission.    Final Clinical Impressions(s) / ED Diagnoses   Final diagnoses:  None    ED Discharge Orders    None         Lajean Saver, MD 01/26/18 2023

## 2018-01-26 NOTE — H&P (Signed)
History and Physical    Zeplin Aleshire FWY:[id number] DOB: [birth year] DOA: 01/26/2018  Referring MD/NP/PA:   PCP: Iona Beard, MD   Patient coming from:  The patient is coming from home.  At baseline, pt is independent for most of ADL.   Chief Complaint: Shortness of breath  HPI: [name] is a 57 y.o. male with medical history significant of ESRD-HD (MWF), hypertension, stroke, GERD, gout, depression, atrial fibrillation not on anticoagulants, sCHF with EF of 30%, CAD, RCC (s/p of nephrectomy), right eye blindness, anemia, who presents with shortness of breath.  Patient was recently hospitalized from 8/22-9/2 mainly because of hemorrhagic shock secondary to spontaneous hemorrhage of left kidney in the setting of supratherapeutic INR. Pt underwent successful left IR renal artery embolization 01/13/18. Pt also had leukocytosis and infiltration on the left lower lobe by chest x-ray, patient was treated with cefepime. Pt states that he started having SOB today, which has been progressively getting worse.  He has mild pleuritic chest pain in the central chest, which is constant, sharp, nonradiating.  It is aggravated with deep breath.  Patient does not have cough.  He has fever and chills.  Patient states that he has right flank pain, which is constant, sharp, 10 out of 10 in severity, nonradiating.  He denies symptoms of UTI.  No hematuria.  Patient does not have nausea, vomiting, diarrhea, abdominal pain, symptoms of UTI.  No unilateral weakness. Pt did HD on [facility]. Pt can barely speaking full sentences, BiPAP was started.  Initially patient had a soft blood pressure 98/58, which dropped to SBP of 80s and has been persistently hypotensive with SBP 70-80s. Mental status normal. IVF of 500 cc and 200 cc of NS were given.  I asked nurse to have measured the blood pressure in other extremities, which showed blood pressure is at SBP 110s.  ED Course: pt was found to have WBC 11.7, lactic acid 2.75,  potassium 4.6, bicarbonate 27, creatinine 8.91, BUN 36, temperature 99.2, soft blood pressure, tachycardia, tachypnea, oxygen saturation 100% on BiPAP, chest x-ray showed patchy left basilar infiltration, mild interstitial edema.  ABG with pH 7.407, PCO2 43.4, PO2 82.7.  Patient is admitted to stepdown as inpatient. Discussed with PCCM, Dr. Lucile Shutters.  CT abdomen/pelvis showed: 1. Unchanged appearance of moderate to large LEFT renal and perinephric hematoma/hemorrhage descending into the pelvis. 2. Bibasilar atelectasis 3.  Aortic Atherosclerosis (ICD10-I70.0).  CTA of chest showed 1. Suboptimal study but probable RIGHT UPPER lobe pulmonary emboli. LOWER lung pulmonary arteries are not well evaluated due to motion. Bibasilar atelectasis 2. Cardiomegaly without CT evidence of RIGHT heart strain 3. Coronary artery disease and Aortic Atherosclerosis (ICD10-I70.0).  Review of Systems:   General: has subjective fevers, chills, no body weight gain, has poor appetite, has fatigue HEENT: no blurry vision, hearing changes or sore throat Respiratory:  has dyspnea, coughing, no wheezing CV: no chest pain, no palpitations GI: no nausea, vomiting, abdominal pain, diarrhea, constipation GU: no dysuria, burning on urination, increased urinary frequency, hematuria  Ext: no leg edema Neuro: no unilateral weakness, numbness, or tingling, no vision change or hearing loss Skin: no rash, no skin tear. MSK: No muscle spasm, no deformity, no limitation of range of movement in spin. Has right flank pain. Heme: No easy bruising.  Travel history: No recent long distant travel.  Allergy:  Allergies  Allergen Reactions  . Ace Inhibitors Itching and Cough    Past Medical History:  Diagnosis Date  . Atrial flutter ([place])   .  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 Cardiomyopathy   . CHF (congestive heart failure) ([place])   . Coronary artery disease    normal coronaries by 10/10/08 cath,  Cardiac Cath 08-04-12 epic.Dr. Doylene Canard follows  . Diabetes mellitus    pt. states he's borderline diabetic., no longer taking med,- off med. since 2013  . Dialysis patient [facility])    Mon-Wed-Fri([place])- Left AV fistula  . Diverticulitis November 2016   reoccurred in December 2016  . ESRD (end stage renal disease) ([place])   . GERD (gastroesophageal reflux disease)   . Gout   . History of nephrectomy 07/04/2012   History of right nephrectomy in 2002 for renal cell carcinoma   . History of unilateral nephrectomy   . Hypertension   . Low iron   . Myocardial infarction [facility]) ?2006  . Renal cell carcinoma    dialysis- MWF- Dr. Mercy Moore follows.  . Renal insufficiency   . Shortness of breath 05/19/11   "at rest, lying down, w/exertion"  . Stroke [facility]) 02/2011   05/19/11 denies residual  . Umbilical hernia 16/10/96   unrepaired    Past Surgical History:  Procedure Laterality Date  . AV FISTULA PLACEMENT  03/29/2011   Procedure: ARTERIOVENOUS (AV) FISTULA CREATION;  Surgeon: Hinda Lenis, MD;  Location: [place];  Service: Vascular;  Laterality: Left;  LEFT RADIOCEPHALIC , Arteriovenous [id number])  . CARDIAC CATHETERIZATION  05/21/11  . CARDIAC CATHETERIZATION N/A 03/16/2016   Procedure: Left Heart Cath and Coronary Angiography;  Surgeon: Dixie Dials, MD;  Location: [place] CV LAB;  Service: Cardiovascular;  Laterality: N/A;  . CHOLECYSTECTOMY N/A 02/12/2016   Procedure: LAPAROSCOPIC CHOLECYSTECTOMY WITH INTRAOPERATIVE CHOLANGIOGRAM;  Surgeon: Jackolyn Confer, MD;  Location: [place];  Service: General;  Laterality: N/A;  . COLONOSCOPY WITH PROPOFOL N/A 02/01/2017   Procedure: COLONOSCOPY WITH PROPOFOL;  Surgeon: Juanita Craver, MD;  Location: [facility] END[facility];  Service: Endoscopy;  Laterality: N/A;  . dialysis cath placed    . ESOPHAGOGASTRODUODENOSCOPY (EGD) WITH PROPOFOL N/A 12/02/2015   Procedure: ESOPHAGOGASTRODUODENOSCOPY (EGD) WITH PROPOFOL;  Surgeon: Juanita Craver,  MD;  Location: [facility];  Service: Endoscopy;  Laterality: N/A;  . FINGER SURGERY     L pinkie finger- ORIF- /w remaining hardware - 1990's    . HERNIA REPAIR N/A 7/82/9562   Umbilical hernia repair  . INSERTION OF DIALYSIS CATHETER N/A 01/27/2016   Procedure: INSERTION OF DIALYSIS CATHETER;  Surgeon: Waynetta Sandy, MD;  Location: [place];  Service: Vascular;  Laterality: N/A;  . INSERTION OF MESH N/A 07/04/2012   Procedure: INSERTION OF MESH;  Surgeon: Madilyn Hook, DO;  Location: [place];  Service: General;  Laterality: N/A;  . IR EMBO ART  VEN HEMORR LYMPH EXTRAV  INC GUIDE ROADMAPPING  01/13/2018  . IR FLUORO GUIDE CV LINE RIGHT  01/13/2018  . IR RENAL SELECTIVE  UNI INC S&I MOD SED  01/13/2018  . IR US GUIDE VASC ACCESS RIGHT  01/13/2018  . LAPAROSCOPIC LYSIS OF ADHESIONS N/A 02/12/2016   Procedure: LAPAROSCOPIC LYSIS OF ADHESIONS;  Surgeon: Jackolyn Confer, MD;  Location: [place];  Service: General;  Laterality: N/A;  . LEFT AND RIGHT HEART CATHETERIZATION WITH CORONARY ANGIOGRAM N/A 08/04/2012   Procedure: LEFT AND RIGHT HEART CATHETERIZATION WITH CORONARY ANGIOGRAM;  Surgeon: Birdie Riddle, MD;  Location: [place] CATH LAB;  Service: Cardiovascular;  Laterality: N/A;  . NEPHRECTOMY  2000   right  . REVISON OF ARTERIOVENOUS FISTULA  Left 06/05/2013   Procedure: REVISON OF LEFT RADIOCEPHALIC  ARTERIOVENOUS FISTULA;  Surgeon: Conrad , MD;  Location: [place];  Service: Vascular;  Laterality: Left;  . REVISON OF ARTERIOVENOUS FISTULA Left 01/27/2016   Procedure: REVISION OF LEFT UPPER EXTREMITY ARTERIOVENOUS FISTULA;  Surgeon: Waynetta Sandy, MD;  Location: [place];  Service: Vascular;  Laterality: Left;  . REVISON OF ARTERIOVENOUS FISTULA Left 08/03/2016   Procedure: REVISON OF Left arm ARTERIOVENOUS FISTULA;  Surgeon: Elam Dutch, MD;  Location: [place];  Service: Vascular;  Laterality: Left;  . REVISON OF ARTERIOVENOUS FISTULA Left 05/06/2017   Procedure: REVISION PLICATION OF  ARTERIOVENOUS FISTULA LEFT ARM;  Surgeon: Rosetta Posner, MD;  Location: [place];  Service: Vascular;  Laterality: Left;  . RIGHT HEART CATHETERIZATION N/A 05/21/2011   Procedure: RIGHT HEART CATH;  Surgeon: Birdie Riddle, MD;  Location: [facility] CATH LAB;  Service: Cardiovascular;  Laterality: N/A;  . smashed  1990's   "left pinky; have a plate in there"  . UMBILICAL HERNIA REPAIR N/A 07/04/2012   Procedure: LAPAROSCOPIC UMBILICAL HERNIA;  Surgeon: Madilyn Hook, DO;  Location: [place];  Service: General;  Laterality: N/A;  . US ECHOCARDIOGRAPHY  05/20/11    Social History:  reports that he has never smoked. He has never used smokeless tobacco. He reports that he does not drink alcohol or use drugs.  Family History:  Family History  Problem Relation Age of Onset  . Hypertension Mother   . Kidney disease Mother      Prior to Admission medications   Medication Sig Start Date End Date Taking? Authorizing Provider  allopurinol (ZYLOPRIM) 300 MG tablet Take 150 mg by mouth every other day.    [provider]  amiodarone (PACERONE) 200 MG tablet Take 0.5 tablets (100 mg total) by mouth daily. 07/21/17   Regalado, Belkys A, MD  ferric citrate (AURYXIA) 1 GM 210 MG(Fe) tablet Take 210 mg by mouth 3 (three) times daily with meals.     [provider]  lidocaine-prilocaine (EMLA) cream Apply 1 application topically See admin instructions. Apply topically to port access prior to dialysis - Monday, Wednesday, Friday 04/08/15   [provider]  midodrine (PROAMATINE) 10 MG tablet Take 0.5-1 tablets (5-10 mg total) by mouth See admin instructions. Take one tablet (10 mg) by mouth before dialysis (Monday, Wednesday, Friday), take 1/2 tablet (5 mg) on non-dialysis days (Sunday, Tuesday, Thursday, Saturday) as needed for diastolic blood pressure <84. 01/23/18   Denton Brick, Courage, MD  mirtazapine (REMERON) 7.5 MG tablet Take 1 tablet (7.5 mg total) by mouth at bedtime. 01/23/18   Roxan Hockey,  MD  multivitamin (RENA-VIT) TABS tablet Take 1 tablet by mouth at bedtime.    [provider]  nitroGLYCERIN (NITROSTAT) 0.4 MG SL tablet Place 0.4 mg under the tongue every 5 (five) minutes as needed for chest pain.  05/20/15   [provider]  pantoprazole (PROTONIX) 40 MG tablet Take 40 mg by mouth 2 (two) times daily.    [provider]    Physical Exam: Vitals:   01/27/18 0240 01/27/18 0300 01/27/18 0317 01/27/18 0438  BP:  126/75  118/71  Pulse: 95 94 94 89  Resp: (!) 37 (!) 29 19 20   Temp:    98.4 F (36.9 C)  TempSrc:    Axillary  SpO2: 100% 99% 98% 99%  Weight:      Height:       General: in acute respiratory distress HEENT:  Eyes: PERRL, EOMI, no scleral icterus.       ENT: No discharge from the ears and nose, no pharynx injection, no tonsillar enlargement.        Neck: No JVD, no bruit, no mass felt. Heme: No neck lymph node enlargement. Cardiac: S1/S2, RRR, No murmurs, No gallops or rubs. Respiratory:  No rales, wheezing, rhonchi or rubs. GI: Soft, nondistended, nontender, no rebound pain, no organomegaly, BS present. GU: No hematuria Ext: No pitting leg edema bilaterally. 2+DP/PT pulse bilaterally. Musculoskeletal: No joint deformities, No joint redness or warmth, no limitation of ROM in spin. Skin: No rashes.  Neuro: Alert, oriented X3, cranial nerves II-XII grossly intact, moves all extremities normally.  Labs on Admission: I have personally reviewed following labs and imaging studies  CBC: Recent Labs  Lab 01/20/18 0701 01/21/18 0936 01/22/18 0853 01/23/18 0629 01/23/18 1301 01/26/18 1812  WBC 9.9 9.8  --   --  9.4 11.7*  NEUTROABS  --  7.2  --   --   --   --   HGB 8.8* 8.5* 9.5* 8.8* 8.6* 9.4*  HCT 27.4* 26.9* 29.1* 26.7* 26.2* 29.6*  MCV 97.9 98.9  --   --  94.9 97.4  PLT 266 248  --   --  275 706   Basic Metabolic Panel: Recent Labs  Lab 01/20/18 0701 01/21/18 0936 01/22/18 0853 01/23/18 0629 01/26/18 1812    NA 134* 131* 132* 132* 135  K 4.0 3.7 4.3 5.1 4.6  CL 95* 95* 96* 96* 92*  CO2 28 26 23 22 27   GLUCOSE 120* 110* 103* 86 94  BUN 22* 32* 53* 67* 36*  CREATININE 6.67* 7.46* 10.56* 12.60* 8.91*  CALCIUM 9.1 9.5 10.4* 10.1 10.4*  PHOS 3.4 3.7 5.0* 5.6*  --    GFR: Estimated Creatinine Clearance: 12.6 mL/min (A) (by C-G formula based on SCr of 8.91 mg/dL (H)). Liver Function Tests: Recent Labs  Lab 01/20/18 0701 01/21/18 0936 01/22/18 0853 01/23/18 0629  ALBUMIN 2.6* 2.5* 2.7* 2.5*   No results for input(s): LIPASE, AMYLASE in the last 168 hours. No results for input(s): AMMONIA in the last 168 hours. Coagulation Profile: Recent Labs  Lab 01/27/18 0222  INR 1.22   Cardiac Enzymes: No results for input(s): CKTOTAL, CKMB, CKMBINDEX, TROPONINI in the last 168 hours. BNP (last 3 results) No results for input(s): PROBNP in the last 8760 hours. HbA1C: No results for input(s): HGBA1C in the last 72 hours. CBG: Recent Labs  Lab 01/23/18 0017 01/23/18 0414 01/23/18 0757 01/23/18 1217 01/23/18 1555  GLUCAP 108* 72 79 95 109*   Lipid Profile: No results for input(s): CHOL, HDL, LDLCALC, TRIG, CHOLHDL, LDLDIRECT in the last 72 hours. Thyroid Function Tests: No results for input(s): TSH, T4TOTAL, FREET4, T3FREE, THYROIDAB in the last 72 hours. Anemia Panel: No results for input(s): VITAMINB12, FOLATE, FERRITIN, TIBC, IRON, RETICCTPCT in the last 72 hours. Urine analysis:    Component Value Date/Time   COLORURINE YELLOW 05/19/2011 [place] 05/19/2011 1042   LABSPEC 1.013 05/19/2011 1042   PHURINE 6.5 05/19/2011 1042   GLUCOSEU NEGATIVE 05/19/2011 1042   HGBUR TRACE (A) 05/19/2011 1042   BILIRUBINUR NEGATIVE 05/19/2011 1042   KETONESUR NEGATIVE 05/19/2011 1042   PROTEINUR >300 (A) 05/19/2011 1042   UROBILINOGEN 0.2 05/19/2011 1042   NITRITE NEGATIVE 05/19/2011 1042   LEUKOCYTESUR NEGATIVE 05/19/2011 1042   Sepsis  Labs: @LABRCNTIP (procalcitonin:4,lacticidven:4) )No results found for this or any previous visit (from the past 240 hour(s)).  Radiological Exams on Admission: Dg Chest 2 View  Result Date: 01/26/2018 CLINICAL DATA:  Shortness of breath for 1 day EXAM: CHEST - 2 VIEW COMPARISON:  January 14, 2018 FINDINGS: The heart size is enlarged. Mediastinal contour is normal. There is patchy consolidation of left lung base. There is mild deep diffuse increased pulmonary interstitium bilaterally. There is no pulmonary edema. IMPRESSION: Patchy consolidation of left lung base suspicious for pneumonia. Mild interstitial edema. Electronically Signed   By: Abelardo Diesel M.D.   On: 01/26/2018 18:38   Ct Angio Chest Pe W/cm &/or Wo Cm  Result Date: 01/26/2018 CLINICAL DATA:  57 year old male with acute chest pain and shortness of breath. History of recent LEFT renal hemorrhage. EXAM: CT ANGIOGRAPHY CHEST WITH CONTRAST TECHNIQUE: Multidetector CT imaging of the chest was performed using the standard protocol during bolus administration of intravenous contrast. Multiplanar CT image reconstructions and MIPs were obtained to evaluate the vascular anatomy. CONTRAST:  117mL ISOVUE-370 IOPAMIDOL (ISOVUE-370) INJECTION 76% COMPARISON:  03/15/2016 chest CT FINDINGS: Cardiovascular: This is a technically suboptimal study due to extensive respiratory motion artifact and less than optimal contrast opacification of the pulmonary arteries. The study was to be repeated but patient had been sent to his hospital room where he became unstable. Probable filling defects/pulmonary emboli within 2 RIGHT UPPER lobe segments. The LOWER lobe pulmonary arteries are not well evaluated due to extensive motion artifact. Cardiomegaly noted without CT evidence of RIGHT heart strain. Coronary artery and aortic atherosclerotic calcifications identified without aortic aneurysm. No pericardial effusion. Mediastinum/Nodes: No enlarged mediastinal, hilar, or  axillary lymph nodes. Thyroid gland, trachea, and esophagus demonstrate no significant findings. Lungs/Pleura: Mild to moderate bibasilar atelectasis noted. No pleural effusion or pneumothorax. Upper Abdomen: LEFT renal/perinephric hemorrhage again noted. Musculoskeletal: No acute or suspicious bony abnormalities. Review of the MIP images confirms the above findings. IMPRESSION: 1. Suboptimal study but probable RIGHT UPPER lobe pulmonary emboli. LOWER lung pulmonary arteries are not well evaluated due to motion. Bibasilar atelectasis 2. Cardiomegaly without CT evidence of RIGHT heart strain 3. Coronary artery disease and Aortic Atherosclerosis (ICD10-I70.0). Critical Value/emergent results were called by telephone at the time of interpretation on 01/26/2018 at 10:57 pm to [facility], RN for this patient, who verbally acknowledged these results. Electronically Signed   By: Margarette Canada M.D.   On: 01/26/2018 22:59   Ct Abdomen Pelvis W Contrast  Result Date: 01/26/2018 CLINICAL DATA:  57 year old male with continued RIGHT flank and abdominal/pelvic pain. History of recent embolization of active LEFT renal hemorrhage. CTA chest will be dictated in a separate report as repeat imaging will be performed. EXAM: CT ABDOMEN AND PELVIS WITH CONTRAST TECHNIQUE: Multidetector CT imaging of the abdomen and pelvis was performed using the standard protocol following bolus administration of intravenous contrast. CONTRAST:  16mL ISOVUE-370 IOPAMIDOL (ISOVUE-370) INJECTION 76% COMPARISON:  01/13/2018 CT FINDINGS: Lower chest: Bibasilar atelectasis noted. Hepatobiliary: The liver is unremarkable except for a small LEFT hepatic cyst. The patient is status post cholecystectomy. No biliary dilatation. Pancreas: Unremarkable Spleen: Unremarkable Adrenals/Urinary Tract: Moderate to large LEFT renal and perinephric hematoma/hemorrhage extending into the pelvis is not significantly changed in appearance. Embolization coils in the region of the  LEFT renal artery noted. The patient is status post RIGHT nephrectomy. The adrenal glands are unremarkable.  The bladder is collapsed. Stomach/Bowel: Stomach is within normal limits. Appendix appears normal. No evidence of bowel wall thickening, distention, or inflammatory changes. Vascular/Lymphatic: Aortic atherosclerosis. No enlarged abdominal or pelvic lymph nodes. Reproductive: Prostate is unremarkable.  Other: No evidence of abscess, pneumoperitoneum or ascites. Musculoskeletal: No acute or suspicious bony abnormalities. IMPRESSION: 1. Unchanged appearance of moderate to large LEFT renal and perinephric hematoma/hemorrhage descending into the pelvis. 2. Bibasilar atelectasis 3.  Aortic Atherosclerosis (ICD10-I70.0). CTA of the chest will be dictated in a separate report. Electronically Signed   By: Margarette Canada M.D.   On: 01/26/2018 22:06     EKG: Independently reviewed.  Sinus rhythm, QTC 473, poor R wave progression, nonspecific T wave change.  Assessment/Plan Principal Problem:   Acute respiratory failure with hypoxia (HCC) Active Problems:   Chronic combined systolic (congestive) and diastolic (congestive) heart failure (HCC)   Gout, chronic   ESRD (end stage renal disease) on dialysis (HCC)   Hemorrhage of left kidney   HCAP (healthcare-associated pneumonia)   CAD (coronary artery disease)   Atrial fibrillation, chronic (HCC)   Depression   GERD (gastroesophageal reflux disease)   Pulmonary embolism (HCC)   Anemia   Acute respiratory failure with hypoxia ([place]): likely due to PE, but pt has mild fever and mild leukocytosis, cannot completely rule out HCAP  Pneumonia. Will start Abx now. Initially patient had a soft blood pressure 98/58, which dropped to SBP of 80s and has been persistently hypotensive with SBP 70-80s. Mental status normal. IVF of 500 cc and 200 cc of NS were given.  I asked nurse to have measured the blood pressure in other extremities, which showed blood pressure is  at SBP 110s.   - will admit to SDU as inpt - IV Vancomycin and cefepime-->may quickly d/c Abx if clinically more clear for not having PNA - Mucinex for cough  - prn Albuterol Nebs for SOB - Urine legionella and S. pneumococcal antigen - Follow up blood culture x2, sputum culture and respiratory virus panel - will get Procalcitonin and trend lactic acid level per sepsis protocol - IVF: 500 ccc and 200 cc of NS bolus  Pulmonary embolism: due to recent left renal hemorrhage, it is too risky to start heparin.  I discussed with PCCM, Dr. Lucile Shutters, who recommended to hold off heparin and place IVC filter tomorrow. -Please call IR in morning. -f/u LE venous doppler for evaluation of DVT  Chronic combined systolic (congestive) and diastolic (congestive) heart failure ([place]): 2D echo on 03/17/2017 showed EF 30-35% with grade 1 diastolic dysfunction.  Chest x-ray has no pulmonary edema.  Patient does not have leg edema.  Patient does not seem to have CHF exacerbation. -Volume management per renal HD  Gout: -continue home allopurinol  ESRD (end stage renal disease) on dialysis [facility]): potassium 4.6, bicarbonate 27, creatinine 8.91, BUN 36. Pt had HD on [place]. -please call renal for HD -midodrine for hypotension  Recent hx of Hemorrhage of left kidney: CT scan showed unchanged appearance of moderate to large LEFT renal and perinephric hematoma/hemorrhage descending into the pelvis. hgb stable. 8.6 on 01/23/18-->9/4 today. -no acute new issues   CAD (coronary artery disease):  -prn NTG  Atrial Fibrillation: CHA2DS2-VASc Score is 5, needs oral anticoagulation, but due recent kidney bleeding, cannot start AC. Heart rate is 90-100s -tele monitoring -amioderone  Depression: -continue home meds  GERD: -Protonix  Anemia: -Continue iron supplement  Inpatient status:  # Patient requires inpatient status due to high intensity of service, high risk for further deterioration and high frequency of  surveillance required.  I certify that at the point of admission it is my clinical judgment that the patient will require inpatient hospital care spanning beyond 2 midnights  from the point of admission.  . This patient has multiple chronic comorbidities including ESRD-HD (MWF), hypertension, stroke, GERD, gout, depression, atrial fibrillation not on anticoagulants, sCHF with EF of 30%, CAD, RCC (s/p of nephrectomy), right eye blindness, anemia . Now patient has presenting symptoms include SOB and CP . The worrisome physical exam findings include acute respiratory distress . The initial radiographic and laboratory data are worrisome because of patchy infiltration in the left lower base by CXR and PE by CAT . Current medical needs: please see my assessment and plan         DVT ppx: none (cannot use Heparin due to recent kidney bleeding; cannot use SCD since we need to rule out DVT) Code Status: Full code Family Communication: None at bed side.   Disposition Plan:  Anticipate discharge back to previous home environment Consults called:  Discussed with PCCM, Dr. Lucile Shutters. Admission status:   Inpatient/tele     Date of Service 01/27/2018    Ivor Costa [facility] Pager [phone]  If 7PM-7AM, please contact night-coverage [email or web address] Password [facility] 01/27/2018, 6:22 AM

## 2018-01-26 NOTE — Progress Notes (Signed)
RT assisted with moving patient from 5M07 to 4E21 on bipap without any difficulties.  Report given to RT covering that floor.

## 2018-01-26 NOTE — Progress Notes (Signed)
Called ER RN for report. Room ready.  

## 2018-01-26 NOTE — ED Notes (Signed)
EMERGENCY DEPARTMENT  US GUIDANCE EXAM Emergency Ultrasound:  US Guidance for Needle Guidance   INDICATIONS: Difficult vascular access Linear probe used in real-time to visualize location of needle entry through skin. Needle in the vein free from any walls.  Blood return noted and flushed with no difficulty.    PERFORMED BY: Myself IMAGES ARCHIVED?: no LIMITATIONS: Left arm restrictions VIEWS USED: Transverse INTERPRETATION: Needle visualized within vein    Tolerated well w/no complications.   Aaron Edelman RN  8:37 PM 01/26/18

## 2018-01-26 NOTE — ED Notes (Signed)
ED Provider at bedside. 

## 2018-01-26 NOTE — Progress Notes (Signed)
Report given to Matt

## 2018-01-26 NOTE — ED Triage Notes (Signed)
Pt states he feels tight in his chest and SOB. Pt reports lower back pain as well when he takes a deep breath.

## 2018-01-26 NOTE — Progress Notes (Signed)
Pharmacy Antibiotic Note  Braylan Faul is a 57 y.o. male admitted on 01/26/2018 with pneumonia.  Pharmacy has been consulted for vancomycin and cefepime dosing. Pt presents with SOB. He has a h/o of MWF HD and was recently discharged on 9/2. WBC 11.2.   Plan: -Start Cefepime 1 gm IV daily  -Vancomycin 2 gm IV once, then start vancomycin 1 gm IV qHD  -Monitor CBC, cultures and clinical progress -VT at SS   Height: 6\' 1"  (185.4 cm) Weight: 250 lb (113.4 kg) IBW/kg (Calculated) : 79.9  Temp (24hrs), Avg:99.2 F (37.3 C), Min:99.2 F (37.3 C), Max:99.2 F (37.3 C)  Recent Labs  Lab 01/20/18 0701 01/21/18 0936 01/22/18 0853 01/23/18 0629 01/23/18 1301 01/26/18 1812  WBC 9.9 9.8  --   --  9.4 11.7*  CREATININE 6.67* 7.46* 10.56* 12.60*  --  8.91*    Estimated Creatinine Clearance: 12.5 mL/min (A) (by C-G formula based on SCr of 8.91 mg/dL (H)).    Allergies  Allergen Reactions  . Ace Inhibitors Itching and Cough    Antimicrobials this admission: Vancomycin 9/5 >>  Cefepime 9/5 >>   Dose adjustments this admission: None  Microbiology results: 9/5 BCx:    Thank you for allowing pharmacy to be a part of this patient's care.  Albertina Parr, PharmD., BCPS Clinical Pharmacist Clinical phone for 01/26/18 until 11pm: [phone]

## 2018-01-26 NOTE — ED Provider Notes (Signed)
Patient placed in Quick Look pathway, seen and evaluated   Chief Complaint: right chest pain and right flank pain, recently discharged after L hemorrhage of kidney requiring AC reversal in ER.  HPI:   Pain in right anterior chest and right flank described as stabbing onset Monday (day he was discharged from hospital), acutely worsening today.  Worse with movement, talking, coughing, breathing in and palpation.    ROS: positive: SOB (due to pain with breathing in), urinary urgency (pt does not urinate x 5 years from HD, urinated blood at last admission), subjective fevers.   Physical Exam:   Gen: No distress  Neuro: Awake and Alert  Skin: Warm    Focused Exam: Looks uncomfortable. Speaking in 2-3 word sentences and taking shallow breaths (due to pain). Reproducible right anterior chest wall and R flank tenderness. +R CVAT. Lungs CTAB. Mild tachycardia, soft BP (h/o hypotension on midodrine).   Initiation of care has begun. The patient has been counseled on the process, plan, and necessity for staying for the completion/evaluation, and the remainder of the medical screening examination    Arlean Hopping 01/26/18 Chrystie Nose, MD 01/26/18 2248

## 2018-01-27 ENCOUNTER — Encounter (HOSPITAL_COMMUNITY): Payer: Self-pay | Admitting: Interventional Radiology

## 2018-01-27 ENCOUNTER — Inpatient Hospital Stay (HOSPITAL_COMMUNITY): Payer: Medicare Other

## 2018-01-27 ENCOUNTER — Other Ambulatory Visit: Payer: Self-pay

## 2018-01-27 DIAGNOSIS — D509 Iron deficiency anemia, unspecified: Secondary | ICD-10-CM

## 2018-01-27 DIAGNOSIS — I2699 Other pulmonary embolism without acute cor pulmonale: Secondary | ICD-10-CM

## 2018-01-27 DIAGNOSIS — I25119 Atherosclerotic heart disease of native coronary artery with unspecified angina pectoris: Secondary | ICD-10-CM

## 2018-01-27 DIAGNOSIS — D649 Anemia, unspecified: Secondary | ICD-10-CM | POA: Diagnosis present

## 2018-01-27 HISTORY — PX: IR IVC FILTER PLMT / S&I /IMG GUID/MOD SED: IMG701

## 2018-01-27 LAB — BLOOD GAS, ARTERIAL
Acid-Base Excess: 2.5 mmol/L — ABNORMAL HIGH (ref 0.0–2.0)
Acid-Base Excess: 4.1 mmol/L — ABNORMAL HIGH (ref 0.0–2.0)
Bicarbonate: 26.8 mmol/L (ref 20.0–28.0)
Bicarbonate: 27.9 mmol/L (ref 20.0–28.0)
Drawn by: 350431
Drawn by: 51831
FIO2: 28
O2 Content: 5 L/min
O2 Saturation: 96.4 %
O2 Saturation: 95.1 %
Patient temperature: 98.6
Patient temperature: 98.6
pCO2 arterial: 43.4 mmHg (ref 32.0–48.0)
pCO2 arterial: 40.3 mmHg (ref 32.0–48.0)
pH, Arterial: 7.407 (ref 7.350–7.450)
pH, Arterial: 7.455 — ABNORMAL HIGH (ref 7.350–7.450)
pO2, Arterial: 82.7 mmHg — ABNORMAL LOW (ref 83.0–108.0)
pO2, Arterial: 72 mmHg — ABNORMAL LOW (ref 83.0–108.0)

## 2018-01-27 LAB — CBC
HCT: 27.8 % — ABNORMAL LOW (ref 39.0–52.0)
Hemoglobin: 8.8 g/dL — ABNORMAL LOW (ref 13.0–17.0)
MCH: 31 pg (ref 26.0–34.0)
MCHC: 31.7 g/dL (ref 30.0–36.0)
MCV: 97.9 fL (ref 78.0–100.0)
Platelets: 305 10*3/uL (ref 150–400)
RBC: 2.84 MIL/uL — ABNORMAL LOW (ref 4.22–5.81)
RDW: 15.5 % (ref 11.5–15.5)
WBC: 12.1 10*3/uL — ABNORMAL HIGH (ref 4.0–10.5)

## 2018-01-27 LAB — PROTIME-INR
INR: 1.22
Prothrombin Time: 15.3 seconds — ABNORMAL HIGH (ref 11.4–15.2)

## 2018-01-27 LAB — HIV ANTIBODY (ROUTINE TESTING W REFLEX): HIV Screen 4th Generation wRfx: NONREACTIVE

## 2018-01-27 LAB — RENAL FUNCTION PANEL
Albumin: 2.6 g/dL — ABNORMAL LOW (ref 3.5–5.0)
Anion gap: 15 (ref 5–15)
BUN: 45 mg/dL — ABNORMAL HIGH (ref 6–20)
CO2: 25 mmol/L (ref 22–32)
Calcium: 9.5 mg/dL (ref 8.9–10.3)
Chloride: 92 mmol/L — ABNORMAL LOW (ref 98–111)
Creatinine, Ser: 9.1 mg/dL — ABNORMAL HIGH (ref 0.61–1.24)
GFR calc Af Amer: 7 mL/min — ABNORMAL LOW (ref >60)
GFR calc non Af Amer: 6 mL/min — ABNORMAL LOW (ref >60)
Glucose, Bld: 81 mg/dL (ref 70–99)
Phosphorus: 5.9 mg/dL — ABNORMAL HIGH (ref 2.5–4.6)
Potassium: 5.3 mmol/L — ABNORMAL HIGH (ref 3.5–5.1)
Sodium: 132 mmol/L — ABNORMAL LOW (ref 135–145)

## 2018-01-27 LAB — LACTIC ACID, PLASMA
Lactic Acid, Venous: 1.3 mmol/L (ref 0.5–1.9)
Lactic Acid, Venous: 0.9 mmol/L (ref 0.5–1.9)

## 2018-01-27 LAB — PROCALCITONIN: Procalcitonin: 4.02 ng/mL

## 2018-01-27 MED ORDER — ALTEPLASE 2 MG IJ SOLR: 2.0000 mg | Freq: Once | INTRAMUSCULAR | Status: DC | PRN

## 2018-01-27 MED ORDER — SODIUM CHLORIDE 0.9 % IV SOLN: 100.0000 mL | INTRAVENOUS | Status: DC | PRN

## 2018-01-27 MED ORDER — LIDOCAINE HCL 1 % IJ SOLN
INTRAMUSCULAR | Status: AC
  Filled 2018-01-27: qty 20

## 2018-01-27 MED ORDER — CHLORHEXIDINE GLUCONATE CLOTH 2 % EX PADS
6.0000 | MEDICATED_PAD | Freq: Every day | CUTANEOUS | Status: DC
  Administered 2018-01-30 – 2018-02-09 (×5): 6 via TOPICAL

## 2018-01-27 MED ORDER — LIDOCAINE HCL (PF) 1 % IJ SOLN: 5.0000 mL | INTRAMUSCULAR | Status: DC | PRN

## 2018-01-27 MED ORDER — SODIUM CHLORIDE 0.9 % IV BOLUS
200.0000 mL | Freq: Once | INTRAVENOUS | Status: AC
  Administered 2018-01-27: 200 mL via INTRAVENOUS

## 2018-01-27 MED ORDER — MORPHINE SULFATE (PF) 2 MG/ML IV SOLN
INTRAVENOUS | Status: AC
  Filled 2018-01-27: qty 1

## 2018-01-27 MED ORDER — MIDAZOLAM HCL 2 MG/2ML IJ SOLN
INTRAMUSCULAR | Status: AC
  Filled 2018-01-27: qty 2

## 2018-01-27 MED ORDER — PRO-STAT SUGAR FREE PO LIQD
30.0000 mL | Freq: Two times a day (BID) | ORAL | Status: DC
  Administered 2018-01-28 – 2018-02-10 (×14): 30 mL via ORAL
  Filled 2018-01-27 (×28): qty 30

## 2018-01-27 MED ORDER — IOPAMIDOL (ISOVUE-300) INJECTION 61%
INTRAVENOUS | Status: AC
  Administered 2018-01-27: 60 mL
  Filled 2018-01-27: qty 100

## 2018-01-27 MED ORDER — LIDOCAINE-PRILOCAINE 2.5-2.5 % EX CREA
1.0000 "application " | TOPICAL_CREAM | CUTANEOUS | Status: DC | PRN
  Filled 2018-01-27: qty 5

## 2018-01-27 MED ORDER — ALBUTEROL SULFATE (2.5 MG/3ML) 0.083% IN NEBU
INHALATION_SOLUTION | RESPIRATORY_TRACT | Status: AC
  Administered 2018-01-27: 2.5 mg via RESPIRATORY_TRACT
  Filled 2018-01-27: qty 3

## 2018-01-27 MED ORDER — FERRIC CITRATE 1 GM 210 MG(FE) PO TABS
630.0000 mg | ORAL_TABLET | Freq: Three times a day (TID) | ORAL | Status: DC
  Administered 2018-01-28 – 2018-02-04 (×20): 630 mg via ORAL
  Filled 2018-01-27 (×28): qty 3

## 2018-01-27 MED ORDER — MIDODRINE HCL 5 MG PO TABS
5.0000 mg | ORAL_TABLET | Freq: Three times a day (TID) | ORAL | Status: DC
  Administered 2018-01-27 – 2018-02-11 (×37): 5 mg via ORAL
  Filled 2018-01-27 (×39): qty 1

## 2018-01-27 MED ORDER — ORAL CARE MOUTH RINSE
15.0000 mL | Freq: Two times a day (BID) | OROMUCOSAL | Status: DC
  Administered 2018-01-28 – 2018-02-10 (×17): 15 mL via OROMUCOSAL

## 2018-01-27 MED ORDER — PENTAFLUOROPROP-TETRAFLUOROETH EX AERO: 1.0000 "application " | INHALATION_SPRAY | CUTANEOUS | Status: DC | PRN

## 2018-01-27 MED ORDER — FENTANYL CITRATE (PF) 100 MCG/2ML IJ SOLN
INTRAMUSCULAR | Status: AC | PRN
  Administered 2018-01-27: 50 ug via INTRAVENOUS

## 2018-01-27 MED ORDER — HEPARIN SODIUM (PORCINE) 1000 UNIT/ML DIALYSIS
1000.0000 [IU] | INTRAMUSCULAR | Status: DC | PRN
  Filled 2018-01-27: qty 1

## 2018-01-27 MED ORDER — CHLORHEXIDINE GLUCONATE 0.12 % MT SOLN
15.0000 mL | Freq: Two times a day (BID) | OROMUCOSAL | Status: DC
  Administered 2018-01-27 – 2018-02-10 (×25): 15 mL via OROMUCOSAL
  Filled 2018-01-27 (×30): qty 15

## 2018-01-27 MED ORDER — VANCOMYCIN HCL IN DEXTROSE 1-5 GM/200ML-% IV SOLN
1000.0000 mg | INTRAVENOUS | Status: AC
  Administered 2018-01-27 – 2018-02-01 (×2): 1000 mg via INTRAVENOUS
  Filled 2018-01-27 (×2): qty 200

## 2018-01-27 MED ORDER — FENTANYL CITRATE (PF) 100 MCG/2ML IJ SOLN
INTRAMUSCULAR | Status: AC
  Filled 2018-01-27: qty 2

## 2018-01-27 MED ORDER — WHITE PETROLATUM EX OINT
TOPICAL_OINTMENT | CUTANEOUS | Status: AC
  Administered 2018-01-27: 07:00:00
  Filled 2018-01-27: qty 28.35

## 2018-01-27 MED ORDER — MIDODRINE HCL 5 MG PO TABS
10.0000 mg | ORAL_TABLET | Freq: Three times a day (TID) | ORAL | Status: DC
  Administered 2018-01-27 (×3): 10 mg via ORAL
  Filled 2018-01-27 (×4): qty 2

## 2018-01-27 MED ORDER — LIDOCAINE HCL (PF) 1 % IJ SOLN
INTRAMUSCULAR | Status: AC | PRN
  Administered 2018-01-27: 5 mL

## 2018-01-27 MED ORDER — MORPHINE SULFATE (PF) 2 MG/ML IV SOLN
2.0000 mg | Freq: Once | INTRAVENOUS | Status: AC
  Administered 2018-01-27: 2 mg via INTRAVENOUS

## 2018-01-27 MED ORDER — MIDAZOLAM HCL 2 MG/2ML IJ SOLN
INTRAMUSCULAR | Status: AC | PRN
  Administered 2018-01-27: 1 mg via INTRAVENOUS

## 2018-01-27 NOTE — Consult Note (Signed)
Chief Complaint: Patient was seen in consultation today for retrievable inferior vena cava filter placement Chief Complaint  Patient presents with  . Shortness of Breath   at the request of Dr Georgiann Mohs  Supervising Physician: Marybelle Killings  Patient Status: [facility] - In-pt  History of Present Illness: [name] is a 57 y.o. male   Known to IR 8/23 Left Renal artery embolization in IR with Dr Barbie Banner  Spontaneous hemorrhage-- secondary supra therapeutic INR ESRD; HTN; CVA; Afib (no AC since renal bleed) DC from [facility] 01/23/18: stable  Came to ED yesterday with SOB CTA:  IMPRESSION: 1. Suboptimal study but probable RIGHT UPPER lobe pulmonary emboli. LOWER lung pulmonary arteries are not well evaluated due to motion. Bibasilar atelectasis 2. Cardiomegaly without CT evidence of RIGHT heart strain 3. Coronary artery disease and Aortic Atherosclerosis (ICD10-I70.0).  Cannot anticoagulate for PE secondary recent renal hemorrhage Request for Inferior vena cava filter placement Reviewed with Dr Barbie Banner -- approves procedure  Past Medical History:  Diagnosis Date  . Atrial flutter ([place])   . 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 Cardiomyopathy   . CHF (congestive heart failure) ([place])   . Coronary artery disease    normal coronaries by 10/10/08 cath, Cardiac Cath 08-04-12 epic.Dr. Doylene Canard follows  . Diabetes mellitus    pt. states he's borderline diabetic., no longer taking med,- off med. since 2013  . Dialysis patient [facility])    Mon-Wed-Fri([place])- Left AV fistula  . Diverticulitis November 2016   reoccurred in December 2016  . ESRD (end stage renal disease) ([place])   . GERD (gastroesophageal reflux disease)   . Gout   . History of nephrectomy 07/04/2012   History of right nephrectomy in 2002 for renal cell carcinoma   . History of unilateral nephrectomy   . Hypertension   . Low iron   . Myocardial infarction [facility]) ?2006  . Renal cell carcinoma    dialysis- MWF- Dr. Mercy Moore follows.  . Renal insufficiency   . Shortness of breath 05/19/11   "at rest, lying down, w/exertion"  . Stroke [facility]) 02/2011   05/19/11 denies residual  . Umbilical hernia 88/41/66   unrepaired    Past Surgical History:  Procedure Laterality Date  . AV FISTULA PLACEMENT  03/29/2011   Procedure: ARTERIOVENOUS (AV) FISTULA CREATION;  Surgeon: Hinda Lenis, MD;  Location: [place];  Service: Vascular;  Laterality: Left;  LEFT RADIOCEPHALIC , Arteriovenous [id number])  . CARDIAC CATHETERIZATION  05/21/11  . CARDIAC CATHETERIZATION N/A 03/16/2016   Procedure: Left Heart Cath and Coronary Angiography;  Surgeon: Dixie Dials, MD;  Location: [place] CV LAB;  Service: Cardiovascular;  Laterality: N/A;  . CHOLECYSTECTOMY N/A 02/12/2016   Procedure: LAPAROSCOPIC CHOLECYSTECTOMY WITH INTRAOPERATIVE CHOLANGIOGRAM;  Surgeon: Jackolyn Confer, MD;  Location: [place];  Service: General;  Laterality: N/A;  . COLONOSCOPY WITH PROPOFOL N/A 02/01/2017   Procedure: COLONOSCOPY WITH PROPOFOL;  Surgeon: Juanita Craver, MD;  Location: [facility] ENDOSCOPY;  Service: Endoscopy;  Laterality: N/A;  . dialysis cath placed    . ESOPHAGOGASTRODUODENOSCOPY (EGD) WITH PROPOFOL N/A 12/02/2015   Procedure: ESOPHAGOGASTRODUODENOSCOPY (EGD) WITH PROPOFOL;  Surgeon: Juanita Craver, MD;  Location: [facility];  Service: Endoscopy;  Laterality: N/A;  . FINGER SURGERY     L pinkie finger- ORIF- /w remaining hardware - 1990's    . HERNIA REPAIR N/A 3/55/7322   Umbilical hernia repair  . INSERTION OF DIALYSIS CATHETER N/A 01/27/2016   Procedure:  INSERTION OF DIALYSIS CATHETER;  Surgeon: Waynetta Sandy, MD;  Location: [place];  Service: Vascular;  Laterality: N/A;  . INSERTION OF MESH N/A 07/04/2012   Procedure: INSERTION OF MESH;  Surgeon: Madilyn Hook, DO;  Location: [place];  Service: General;  Laterality: N/A;  . IR EMBO ART  VEN HEMORR LYMPH EXTRAV  INC GUIDE ROADMAPPING  01/13/2018  . IR FLUORO  GUIDE CV LINE RIGHT  01/13/2018  . IR RENAL SELECTIVE  UNI INC S&I MOD SED  01/13/2018  . IR US GUIDE VASC ACCESS RIGHT  01/13/2018  . LAPAROSCOPIC LYSIS OF ADHESIONS N/A 02/12/2016   Procedure: LAPAROSCOPIC LYSIS OF ADHESIONS;  Surgeon: Jackolyn Confer, MD;  Location: [place];  Service: General;  Laterality: N/A;  . LEFT AND RIGHT HEART CATHETERIZATION WITH CORONARY ANGIOGRAM N/A 08/04/2012   Procedure: LEFT AND RIGHT HEART CATHETERIZATION WITH CORONARY ANGIOGRAM;  Surgeon: Birdie Riddle, MD;  Location: [place] CATH LAB;  Service: Cardiovascular;  Laterality: N/A;  . NEPHRECTOMY  2000   right  . REVISON OF ARTERIOVENOUS FISTULA Left 06/05/2013   Procedure: REVISON OF LEFT RADIOCEPHALIC  ARTERIOVENOUS FISTULA;  Surgeon: Conrad La Crosse, MD;  Location: [place];  Service: Vascular;  Laterality: Left;  . REVISON OF ARTERIOVENOUS FISTULA Left 01/27/2016   Procedure: REVISION OF LEFT UPPER EXTREMITY ARTERIOVENOUS FISTULA;  Surgeon: Waynetta Sandy, MD;  Location: [place];  Service: Vascular;  Laterality: Left;  . REVISON OF ARTERIOVENOUS FISTULA Left 08/03/2016   Procedure: REVISON OF Left arm ARTERIOVENOUS FISTULA;  Surgeon: Elam Dutch, MD;  Location: [place];  Service: Vascular;  Laterality: Left;  . REVISON OF ARTERIOVENOUS FISTULA Left 05/06/2017   Procedure: REVISION PLICATION OF ARTERIOVENOUS FISTULA LEFT ARM;  Surgeon: Rosetta Posner, MD;  Location: [place];  Service: Vascular;  Laterality: Left;  . RIGHT HEART CATHETERIZATION N/A 05/21/2011   Procedure: RIGHT HEART CATH;  Surgeon: Birdie Riddle, MD;  Location: [facility] CATH LAB;  Service: Cardiovascular;  Laterality: N/A;  . smashed  1990's   "left pinky; have a plate in there"  . UMBILICAL HERNIA REPAIR N/A 07/04/2012   Procedure: LAPAROSCOPIC UMBILICAL HERNIA;  Surgeon: Madilyn Hook, DO;  Location: [place];  Service: General;  Laterality: N/A;  . US ECHOCARDIOGRAPHY  05/20/11    Allergies: Ace inhibitors  Medications: Prior to Admission medications     Medication Sig Start Date End Date Taking? Authorizing Provider  allopurinol (ZYLOPRIM) 300 MG tablet Take 150 mg by mouth every other day.   Yes [provider]  amiodarone (PACERONE) 200 MG tablet Take 0.5 tablets (100 mg total) by mouth daily. 07/21/17  Yes Regalado, Belkys A, MD  calcium acetate (PHOSLO) 667 MG capsule Take 667-2,001 mg by mouth See admin instructions. Take 3 capsules with meals and 1 capsule with snacks. 01/24/18  Yes [provider]  ferric citrate (AURYXIA) 1 GM 210 MG(Fe) tablet Take 210 mg by mouth 3 (three) times daily with meals.    Yes [provider]  lidocaine-prilocaine (EMLA) cream Apply 1 application topically See admin instructions. Apply topically to port access prior to dialysis - Monday, Wednesday, Friday 04/08/15  Yes [provider]  midodrine (PROAMATINE) 10 MG tablet Take 0.5-1 tablets (5-10 mg total) by mouth See admin instructions. Take one tablet (10 mg) by mouth before dialysis (Monday, Wednesday, Friday), take 1/2 tablet (5 mg) on non-dialysis days (Sunday, Tuesday, Thursday, Saturday) as needed for diastolic blood pressure <62. 01/23/18  Yes Emokpae, Courage, MD  mirtazapine (REMERON)  7.5 MG tablet Take 1 tablet (7.5 mg total) by mouth at bedtime. 01/23/18  Yes Emokpae, Courage, MD  multivitamin (RENA-VIT) TABS tablet Take 1 tablet by mouth at bedtime.   Yes [provider]  nitroGLYCERIN (NITROSTAT) 0.4 MG SL tablet Place 0.4 mg under the tongue every 5 (five) minutes as needed for chest pain.  05/20/15  Yes [provider]  pantoprazole (PROTONIX) 40 MG tablet Take 40 mg by mouth 2 (two) times daily.   Yes [provider]     Family History  Problem Relation Age of Onset  . Hypertension Mother   . Kidney disease Mother     Social History   Socioeconomic History  . Marital status: Married    Spouse name: Not on file  . Number of children: Not on file  . Years of education: Not on file   . Highest education level: Not on file  Occupational History  . Not on file  Social Needs  . Financial resource strain: Not on file  . Food insecurity:    Worry: Not on file    Inability: Not on file  . Transportation needs:    Medical: Not on file    Non-medical: Not on file  Tobacco Use  . Smoking status: Never Smoker  . Smokeless tobacco: Never Used  Substance and Sexual Activity  . Alcohol use: No    Alcohol/week: 0.0 standard drinks  . Drug use: No    Types: Cocaine    Comment: Past hx-none in 20 yrs  . Sexual activity: Yes  Lifestyle  . Physical activity:    Days per week: Not on file    Minutes per session: Not on file  . Stress: Not on file  Relationships  . Social connections:    Talks on phone: Not on file    Gets together: Not on file    Attends religious service: Not on file    Active member of club or organization: Not on file    Attends meetings of clubs or organizations: Not on file    Relationship status: Not on file  Other Topics Concern  . Not on file  Social History Narrative  . Not on file     Review of Systems: A 12 point ROS discussed and pertinent positives are indicated in the HPI above.  All other systems are negative.  Review of Systems  Constitutional: Positive for activity change. Negative for fatigue and fever.  Respiratory: Positive for shortness of breath. Negative for wheezing.   Cardiovascular: Negative for chest pain.  Neurological: Positive for weakness.  Psychiatric/Behavioral: Positive for decreased concentration. Negative for behavioral problems.    Vital Signs: BP 134/72 (BP Location: Left Leg)   Pulse 86   Temp 98.4 F (36.9 C) (Axillary)   Resp (!) 21   Ht 6\' 1"  (1.854 m)   Wt 251 lb 15.8 oz (114.3 kg) Comment: Bed that pt transferred in. Unsure of how it was zeroed.  SpO2 99%   BMI 33.25 kg/m   Physical Exam  Cardiovascular: Normal rate and regular rhythm.  Pulmonary/Chest: Effort normal and breath sounds  normal.  Abdominal: Soft.  Musculoskeletal: Normal range of motion.  Neurological: He is alert.  Skin: Skin is warm and dry.  Psychiatric:  Consented with wife Rose via phone Sisters at bedside  Vitals reviewed.   Imaging: Dg Chest 2 View  Result Date: 01/26/2018 CLINICAL DATA:  Shortness of breath for 1 day EXAM: CHEST - 2 VIEW COMPARISON:  January 14, 2018 FINDINGS: The heart size is enlarged. Mediastinal contour is normal. There is patchy consolidation of left lung base. There is mild deep diffuse increased pulmonary interstitium bilaterally. There is no pulmonary edema. IMPRESSION: Patchy consolidation of left lung base suspicious for pneumonia. Mild interstitial edema. Electronically Signed   By: Abelardo Diesel M.D.   On: 01/26/2018 18:38   Ct Angio Chest Pe W/cm &/or Wo Cm  Result Date: 01/26/2018 CLINICAL DATA:  57 year old male with acute chest pain and shortness of breath. History of recent LEFT renal hemorrhage. EXAM: CT ANGIOGRAPHY CHEST WITH CONTRAST TECHNIQUE: Multidetector CT imaging of the chest was performed using the standard protocol during bolus administration of intravenous contrast. Multiplanar CT image reconstructions and MIPs were obtained to evaluate the vascular anatomy. CONTRAST:  120mL ISOVUE-370 IOPAMIDOL (ISOVUE-370) INJECTION 76% COMPARISON:  03/15/2016 chest CT FINDINGS: Cardiovascular: This is a technically suboptimal study due to extensive respiratory motion artifact and less than optimal contrast opacification of the pulmonary arteries. The study was to be repeated but patient had been sent to his hospital room where he became unstable. Probable filling defects/pulmonary emboli within 2 RIGHT UPPER lobe segments. The LOWER lobe pulmonary arteries are not well evaluated due to extensive motion artifact. Cardiomegaly noted without CT evidence of RIGHT heart strain. Coronary artery and aortic atherosclerotic calcifications identified without aortic aneurysm. No  pericardial effusion. Mediastinum/Nodes: No enlarged mediastinal, hilar, or axillary lymph nodes. Thyroid gland, trachea, and esophagus demonstrate no significant findings. Lungs/Pleura: Mild to moderate bibasilar atelectasis noted. No pleural effusion or pneumothorax. Upper Abdomen: LEFT renal/perinephric hemorrhage again noted. Musculoskeletal: No acute or suspicious bony abnormalities. Review of the MIP images confirms the above findings. IMPRESSION: 1. Suboptimal study but probable RIGHT UPPER lobe pulmonary emboli. LOWER lung pulmonary arteries are not well evaluated due to motion. Bibasilar atelectasis 2. Cardiomegaly without CT evidence of RIGHT heart strain 3. Coronary artery disease and Aortic Atherosclerosis (ICD10-I70.0). Critical Value/emergent results were called by telephone at the time of interpretation on 01/26/2018 at 10:57 pm to [facility], RN for this patient, who verbally acknowledged these results. Electronically Signed   By: Margarette Canada M.D.   On: 01/26/2018 22:59   Ct Abdomen Pelvis W Contrast  Result Date: 01/26/2018 CLINICAL DATA:  57 year old male with continued RIGHT flank and abdominal/pelvic pain. History of recent embolization of active LEFT renal hemorrhage. CTA chest will be dictated in a separate report as repeat imaging will be performed. EXAM: CT ABDOMEN AND PELVIS WITH CONTRAST TECHNIQUE: Multidetector CT imaging of the abdomen and pelvis was performed using the standard protocol following bolus administration of intravenous contrast. CONTRAST:  120mL ISOVUE-370 IOPAMIDOL (ISOVUE-370) INJECTION 76% COMPARISON:  01/13/2018 CT FINDINGS: Lower chest: Bibasilar atelectasis noted. Hepatobiliary: The liver is unremarkable except for a small LEFT hepatic cyst. The patient is status post cholecystectomy. No biliary dilatation. Pancreas: Unremarkable Spleen: Unremarkable Adrenals/Urinary Tract: Moderate to large LEFT renal and perinephric hematoma/hemorrhage extending into the pelvis is not  significantly changed in appearance. Embolization coils in the region of the LEFT renal artery noted. The patient is status post RIGHT nephrectomy. The adrenal glands are unremarkable.  The bladder is collapsed. Stomach/Bowel: Stomach is within normal limits. Appendix appears normal. No evidence of bowel wall thickening, distention, or inflammatory changes. Vascular/Lymphatic: Aortic atherosclerosis. No enlarged abdominal or pelvic lymph nodes. Reproductive: Prostate is unremarkable. Other: No evidence of abscess, pneumoperitoneum or ascites. Musculoskeletal: No acute or suspicious bony abnormalities. IMPRESSION: 1. Unchanged appearance of moderate to large LEFT renal and perinephric hematoma/hemorrhage  descending into the pelvis. 2. Bibasilar atelectasis 3.  Aortic Atherosclerosis (ICD10-I70.0). CTA of the chest will be dictated in a separate report. Electronically Signed   By: Margarette Canada M.D.   On: 01/26/2018 22:06   Ct Abdomen Pelvis W Contrast  Result Date: 01/13/2018 CLINICAL DATA:  Abdominal pain, diverticulitis suspected. Dialysis patient with episode of hematuria today, left abdominal pain. EXAM: CT ABDOMEN AND PELVIS WITH CONTRAST TECHNIQUE: Multidetector CT imaging of the abdomen and pelvis was performed using the standard protocol following bolus administration of intravenous contrast. CONTRAST:  18mL ISOVUE-300 IOPAMIDOL (ISOVUE-300) INJECTION 61% COMPARISON:  CT 02/22/2017 FINDINGS: Lower chest: Multi chamber cardiomegaly. Ground-glass and consolidative opacity in the dependent left lower lobe. No pleural fluid. Hepatobiliary: Small cyst in the left hepatic lobe, unchanged. No new hepatic lesion. Postcholecystectomy with small cystic duct remnant. No biliary dilatation. Pancreas: No ductal dilatation or inflammation. Spleen: Normal in size without focal abnormality. Splenule inferiorly. Adrenals/Urinary Tract: Normal adrenal glands. Post right nephrectomy without suspicious soft tissue density in  the nephrectomy bed. There is an ovoid heterogeneous region in the upper left kidney measuring 3.5 x 4.5 cm that is new from prior exam, containing a punctate internal hyperdensity, axial image 26 series 3. Adjacent perinephric edema. Mild left hydroureteronephrosis containing high-density material. Otherwise left renal parenchymal atrophy and cystic changes of chronic renal disease. Urinary bladder completely decompressed. Stomach/Bowel: Colonic diverticulosis without diverticulitis. Small colonic stool burden. Normal appendix. No small bowel dilatation, obstruction or inflammation. Stomach is nondistended. Vascular/Lymphatic: Aortic and branch atherosclerosis. No aneurysm. Multiple small retroperitoneal and upper abdominal lymph nodes not enlarged by size criteria. No bulky adenopathy. Reproductive: Prostate is unremarkable. Other: Fat containing umbilical hernia.  No ascites.  No free air. Musculoskeletal: Degenerative disc disease L4-L5. There are no acute or suspicious osseous abnormalities. IMPRESSION: 1. New 3.5 x 4.5 cm ovoid heterogeneous hyperdensity in the upper left kidney with surrounding stranding and small central focus of high-density material, concerning for acute bleed. Findings may be hematoma due to elevated INR versus underlying lesion such as renal cell carcinoma with hemorrhage. Additionally there is mild left hydroureteronephrosis with high-density material throughout the renal collecting system and ureter consistent with blood. 2. Colonic diverticulosis without diverticulitis. 3. Patchy opacity in the left lung base likely reactive atelectasis. 4.  Aortic Atherosclerosis (ICD10-I70.0). These results were called by telephone at the time of interpretation on 01/13/2018 at 12:44 am to Dr. Addison Lank , who verbally acknowledged these results. Electronically Signed   By: Jeb Levering M.D.   On: 01/13/2018 00:44   Ir Angiogram Renal Left Selective  Result Date: 01/13/2018 INDICATION:  Hypovolemic shock.  Left perinephric hematoma. EXAM: IR RIGHT FLOURO GUIDE CV LINE; IR EMBO ART VEN HEMORR LYMPH EXTRAV INC GUIDE ROADMAPPING; IR RENAL SELECTIVE UNI INC S+I MODERATE SEDATION; IR ULTRASOUND GUIDANCE VASC ACCESS RIGHT MEDICATIONS: None. The antibiotic was administered within 1 hour of the procedure ANESTHESIA/SEDATION: Moderate (conscious) sedation was employed during this procedure. A total of Versed 0.5 mg and Fentanyl 0 mcg was administered intravenously. Moderate Sedation Time: 57 minutes. The patient's level of consciousness and vital signs were monitored continuously by radiology nursing throughout the procedure under my direct supervision. CONTRAST:  86mL OMNIPAQUE IOHEXOL 300 MG/ML  SOLN FLUOROSCOPY TIME:  Fluoroscopy Time: 12 minutes 30 seconds (620 mGy). COMPLICATIONS: None immediate. PROCEDURE: Informed consent was obtained from the patient following explanation of the procedure, risks, benefits and alternatives. The patient understands, agrees and consents for the procedure. All questions were addressed. A  time out was performed prior to the initiation of the procedure. Maximal barrier sterile technique utilized including caps, mask, sterile gowns, sterile gloves, large sterile drape, hand hygiene, and Betadine prep. The right groin was prepped and draped in a sterile fashion. 1% lidocaine was utilized for local anesthesia. Under sonographic guidance, a micropuncture needle was inserted into the right common femoral artery. Sonographic documentation was obtained and the common femoral artery was noted to be patent. A 1 8 wire was up sized to a Bentson. Five French sheath was inserted. A 5 French pigtail catheter was advanced into the abdominal aorta and AP arteriography was performed. The pigtail was exchanged for a five [place] sauce Omni catheter was advanced over the wire into the thoracic aorta. The reverse curve was formed. The catheter was retracted. It was advanced over a Bentson  wire into the left renal artery. Angiography was performed. A Renegade microcatheter was advanced through the sauce Omni catheter and over a 016 fathom wire into the peripheral branches of the left renal artery towards the upper pole. Contrast was injected confirming position in in the peripheral left renal arterial tree. Coil embolization was performed. Coiled utilized were 6 mm x 10 cm interlock, 6 mm x 20 cm interlock, 6 mm x 20 cm interlock. Repeat angiography through the microcatheter was performed. This demonstrated some residual flow through the coil pack. Gel-Foam slurry was then injected through the microcatheter until stasis of flow was achieved. Microcatheter was removed. The soft was retracted into the right common iliac artery and right femoral angiography was performed. The catheter was removed. The sheath was flushed and sewn in place. Under sonographic guidance, a micropuncture needle was inserted into the left common femoral vein and removed over a 018 wire which was up sized to a 3 J. Sonographic documentation was obtained. The right common femoral vein was noted to be patent. A 7 French triple-lumen catheter was advanced over the 035 3 J wire. It was flushed and sewn in place. FINDINGS: Aortic arteriography demonstrates left renal artery anatomy. There is significant narrowing just beyond its takeoff. The right renal artery is not clearly visualized and most likely has been ligated. Selective arteriography of the left renal artery delineates anatomy and demonstrates active extravasation involving the upper pole of the left kidney in multiple locations. Subsequent imaging demonstrates selective injection of third order branches of the left renal artery. Serial imaging demonstrates deposition of coils within the left upper pole renal artery branches extending into the main left renal artery. Final arteriography demonstrates occlusion of the left renal artery. Femoral arteriography delineates right  femoral artery anatomy and sheath insertion. Final imaging demonstrates a right common femoral vein triple-lumen central venous catheter with its tip in the right common iliac vein. IMPRESSION: Successful coil embolization of the main left renal artery with stasis of flow and resolution of active extravasation of bleeding from the upper pole of the left kidney. Successful right common femoral vein central venous triple-lumen catheter. Electronically Signed   By: Marybelle Killings M.D.   On: 01/13/2018 13:50   Ir Fluoro Guide Cv Line Right  Result Date: 01/13/2018 INDICATION: Hypovolemic shock.  Left perinephric hematoma. EXAM: IR RIGHT FLOURO GUIDE CV LINE; IR EMBO ART VEN HEMORR LYMPH EXTRAV INC GUIDE ROADMAPPING; IR RENAL SELECTIVE UNI INC S+I MODERATE SEDATION; IR ULTRASOUND GUIDANCE VASC ACCESS RIGHT MEDICATIONS: None. The antibiotic was administered within 1 hour of the procedure ANESTHESIA/SEDATION: Moderate (conscious) sedation was employed during this procedure. A total of Versed  0.5 mg and Fentanyl 0 mcg was administered intravenously. Moderate Sedation Time: 57 minutes. The patient's level of consciousness and vital signs were monitored continuously by radiology nursing throughout the procedure under my direct supervision. CONTRAST:  68mL OMNIPAQUE IOHEXOL 300 MG/ML  SOLN FLUOROSCOPY TIME:  Fluoroscopy Time: 12 minutes 30 seconds (620 mGy). COMPLICATIONS: None immediate. PROCEDURE: Informed consent was obtained from the patient following explanation of the procedure, risks, benefits and alternatives. The patient understands, agrees and consents for the procedure. All questions were addressed. A time out was performed prior to the initiation of the procedure. Maximal barrier sterile technique utilized including caps, mask, sterile gowns, sterile gloves, large sterile drape, hand hygiene, and Betadine prep. The right groin was prepped and draped in a sterile fashion. 1% lidocaine was utilized for local  anesthesia. Under sonographic guidance, a micropuncture needle was inserted into the right common femoral artery. Sonographic documentation was obtained and the common femoral artery was noted to be patent. A 1 8 wire was up sized to a Bentson. Five French sheath was inserted. A 5 French pigtail catheter was advanced into the abdominal aorta and AP arteriography was performed. The pigtail was exchanged for a five [place] sauce Omni catheter was advanced over the wire into the thoracic aorta. The reverse curve was formed. The catheter was retracted. It was advanced over a Bentson wire into the left renal artery. Angiography was performed. A Renegade microcatheter was advanced through the sauce Omni catheter and over a 016 fathom wire into the peripheral branches of the left renal artery towards the upper pole. Contrast was injected confirming position in in the peripheral left renal arterial tree. Coil embolization was performed. Coiled utilized were 6 mm x 10 cm interlock, 6 mm x 20 cm interlock, 6 mm x 20 cm interlock. Repeat angiography through the microcatheter was performed. This demonstrated some residual flow through the coil pack. Gel-Foam slurry was then injected through the microcatheter until stasis of flow was achieved. Microcatheter was removed. The soft was retracted into the right common iliac artery and right femoral angiography was performed. The catheter was removed. The sheath was flushed and sewn in place. Under sonographic guidance, a micropuncture needle was inserted into the left common femoral vein and removed over a 018 wire which was up sized to a 3 J. Sonographic documentation was obtained. The right common femoral vein was noted to be patent. A 7 French triple-lumen catheter was advanced over the 035 3 J wire. It was flushed and sewn in place. FINDINGS: Aortic arteriography demonstrates left renal artery anatomy. There is significant narrowing just beyond its takeoff. The right renal artery  is not clearly visualized and most likely has been ligated. Selective arteriography of the left renal artery delineates anatomy and demonstrates active extravasation involving the upper pole of the left kidney in multiple locations. Subsequent imaging demonstrates selective injection of third order branches of the left renal artery. Serial imaging demonstrates deposition of coils within the left upper pole renal artery branches extending into the main left renal artery. Final arteriography demonstrates occlusion of the left renal artery. Femoral arteriography delineates right femoral artery anatomy and sheath insertion. Final imaging demonstrates a right common femoral vein triple-lumen central venous catheter with its tip in the right common iliac vein. IMPRESSION: Successful coil embolization of the main left renal artery with stasis of flow and resolution of active extravasation of bleeding from the upper pole of the left kidney. Successful right common femoral vein central venous triple-lumen catheter.  Electronically Signed   By: Marybelle Killings M.D.   On: 01/13/2018 13:50   Ir US Guide Vasc Access Right  Result Date: 01/13/2018 INDICATION: Hypovolemic shock.  Left perinephric hematoma. EXAM: IR RIGHT FLOURO GUIDE CV LINE; IR EMBO ART VEN HEMORR LYMPH EXTRAV INC GUIDE ROADMAPPING; IR RENAL SELECTIVE UNI INC S+I MODERATE SEDATION; IR ULTRASOUND GUIDANCE VASC ACCESS RIGHT MEDICATIONS: None. The antibiotic was administered within 1 hour of the procedure ANESTHESIA/SEDATION: Moderate (conscious) sedation was employed during this procedure. A total of Versed 0.5 mg and Fentanyl 0 mcg was administered intravenously. Moderate Sedation Time: 57 minutes. The patient's level of consciousness and vital signs were monitored continuously by radiology nursing throughout the procedure under my direct supervision. CONTRAST:  52mL OMNIPAQUE IOHEXOL 300 MG/ML  SOLN FLUOROSCOPY TIME:  Fluoroscopy Time: 12 minutes 30 seconds (620  mGy). COMPLICATIONS: None immediate. PROCEDURE: Informed consent was obtained from the patient following explanation of the procedure, risks, benefits and alternatives. The patient understands, agrees and consents for the procedure. All questions were addressed. A time out was performed prior to the initiation of the procedure. Maximal barrier sterile technique utilized including caps, mask, sterile gowns, sterile gloves, large sterile drape, hand hygiene, and Betadine prep. The right groin was prepped and draped in a sterile fashion. 1% lidocaine was utilized for local anesthesia. Under sonographic guidance, a micropuncture needle was inserted into the right common femoral artery. Sonographic documentation was obtained and the common femoral artery was noted to be patent. A 1 8 wire was up sized to a Bentson. Five French sheath was inserted. A 5 French pigtail catheter was advanced into the abdominal aorta and AP arteriography was performed. The pigtail was exchanged for a five [place] sauce Omni catheter was advanced over the wire into the thoracic aorta. The reverse curve was formed. The catheter was retracted. It was advanced over a Bentson wire into the left renal artery. Angiography was performed. A Renegade microcatheter was advanced through the sauce Omni catheter and over a 016 fathom wire into the peripheral branches of the left renal artery towards the upper pole. Contrast was injected confirming position in in the peripheral left renal arterial tree. Coil embolization was performed. Coiled utilized were 6 mm x 10 cm interlock, 6 mm x 20 cm interlock, 6 mm x 20 cm interlock. Repeat angiography through the microcatheter was performed. This demonstrated some residual flow through the coil pack. Gel-Foam slurry was then injected through the microcatheter until stasis of flow was achieved. Microcatheter was removed. The soft was retracted into the right common iliac artery and right femoral angiography was  performed. The catheter was removed. The sheath was flushed and sewn in place. Under sonographic guidance, a micropuncture needle was inserted into the left common femoral vein and removed over a 018 wire which was up sized to a 3 J. Sonographic documentation was obtained. The right common femoral vein was noted to be patent. A 7 French triple-lumen catheter was advanced over the 035 3 J wire. It was flushed and sewn in place. FINDINGS: Aortic arteriography demonstrates left renal artery anatomy. There is significant narrowing just beyond its takeoff. The right renal artery is not clearly visualized and most likely has been ligated. Selective arteriography of the left renal artery delineates anatomy and demonstrates active extravasation involving the upper pole of the left kidney in multiple locations. Subsequent imaging demonstrates selective injection of third order branches of the left renal artery. Serial imaging demonstrates deposition of coils within the left upper  pole renal artery branches extending into the main left renal artery. Final arteriography demonstrates occlusion of the left renal artery. Femoral arteriography delineates right femoral artery anatomy and sheath insertion. Final imaging demonstrates a right common femoral vein triple-lumen central venous catheter with its tip in the right common iliac vein. IMPRESSION: Successful coil embolization of the main left renal artery with stasis of flow and resolution of active extravasation of bleeding from the upper pole of the left kidney. Successful right common femoral vein central venous triple-lumen catheter. Electronically Signed   By: Marybelle Killings M.D.   On: 01/13/2018 13:50   Dg Chest Port 1 View  Result Date: 01/14/2018 CLINICAL DATA:  Congestive heart failure EXAM: PORTABLE CHEST 1 VIEW COMPARISON:  07/17/2017 FINDINGS: Chronic cardiomegaly. Abnormal density in the left lower chest probably secondary to a combination of pleural fluid and  left lower lobe density. This could be simple atelectasis or pneumonia. The right chest is clear. No sign of pulmonary edema. IMPRESSION: Cardiomegaly. Left effusion. Abnormal left base pulmonary density which could be atelectasis and/or pneumonia. Electronically Signed   By: Nelson Chimes M.D.   On: 01/14/2018 13:00   Ct Renal Stone Study  Result Date: 01/13/2018 CLINICAL DATA:  Left renal hemorrhage with increased pain EXAM: CT ABDOMEN AND PELVIS WITHOUT CONTRAST TECHNIQUE: Multidetector CT imaging of the abdomen and pelvis was performed following the standard protocol without oral or IV contrast. COMPARISON:  January 13, 2018 study obtained earlier in the day FINDINGS: Lower chest: There is bibasilar atelectatic change. There are multiple foci coronary artery calcification. There is a small left pleural effusion. Hepatobiliary: There is an 8 mm probable cyst in the left lobe of the liver. No other focal liver lesions are evident. Gallbladder is absent. There is no biliary duct dilatation. Pancreas: No pancreatic mass or inflammatory focus. The pancreas is displaced anteriorly due to large amount of hemorrhage within the left perinephric fascia. Spleen: No splenic lesions are identified. Spleen is displaced laterally due to extensive left perirenal hemorrhage. Adrenals/Urinary Tract: Adrenals appear unremarkable. There has been extensive progression of hemorrhage throughout the left kidney and perirenal region. The left kidney is edematous with extensive hemorrhage throughout the left kidney distorting renal architecture. There is extensive soft tissue stranding and increased attenuation fluid throughout the perinephric fascia causing expansion of the perinephric fascia and displacement of adjacent structures. There is mass effect on the adjacent left psoas muscle medially as well as displacement the pancreas anteriorly in the spleen laterally. Hemorrhage tracks throughout the renal parenchyma diffusely, best  appreciated on sagittal and coronal images. There are several cystic areas within the left kidney as well as the site of original hematoma in the upper pole region, less well-defined than earlier in the day. The right kidney is absent with surgical clips in the right pararenal region. No mass lesions seen in the right retroperitoneum. Urinary bladder is midline and collapsed. No calculus is seen in the left kidney or ureter. Stomach/Bowel: There is no appreciable bowel wall or mesenteric thickening. Loops of bowel are displaced medially due to the large pararenal hemorrhage on the left. No bowel obstruction. No free air or portal venous air evident. Vascular/Lymphatic: There is aortoiliac atherosclerosis. No aneurysm evident. No adenopathy appreciable. Reproductive: Prostate and seminal vesicles are normal in size and contour. Other: Appendix appears normal. There is a ventral hernia containing only fat, stable. There is fat in each inguinal ring. Note that there is hemorrhage tracking from the pararenal region along the left  retroperitoneum into a small inguinal hernia on the left. No abscess evident in the abdomen pelvis. No ascites. Musculoskeletal: There is degenerative change in the lumbar spine with vacuum phenomenon at L4-5. No blastic or lytic bone lesions. No intramuscular lesions are evident. IMPRESSION: 1. Extensive left perinephric hemorrhage with distortion of the renal parenchyma due to hemorrhage. Hemorrhage extends throughout the pararenal fascia with extension of the pararenal fascia and displacement of adjacent structures as noted. Fluid tracks from the left pararenal fascia inferiorly along the left pelvis to extend into a left inguinal hernia which earlier in the day contained only fat. 2.  Right kidney absent.  No lesions seen in right pararenal fossa. 3. No bowel obstruction. No abscess in the abdomen or pelvis. Appendix appears unremarkable. 4.  There is aortoiliac atherosclerosis. 5.  Small  ventral hernia containing only fat. 6.  Small left pleural effusion. Aortic Atherosclerosis (ICD10-I70.0). Critical Value/emergent results were called by telephone at the time of interpretation on 01/13/2018 at 7:52 am to emergency department physician covering for Dr. Alcario Drought, who verbally acknowledged these results. Electronically Signed   By: Lowella Grip III M.D.   On: 01/13/2018 07:52   Ir Embo Art  [place] Guide Roadmapping  Result Date: 01/13/2018 INDICATION: Hypovolemic shock.  Left perinephric hematoma. EXAM: IR RIGHT FLOURO GUIDE CV LINE; IR EMBO ART VEN HEMORR LYMPH EXTRAV INC GUIDE ROADMAPPING; IR RENAL SELECTIVE UNI INC S+I MODERATE SEDATION; IR ULTRASOUND GUIDANCE VASC ACCESS RIGHT MEDICATIONS: None. The antibiotic was administered within 1 hour of the procedure ANESTHESIA/SEDATION: Moderate (conscious) sedation was employed during this procedure. A total of Versed 0.5 mg and Fentanyl 0 mcg was administered intravenously. Moderate Sedation Time: 57 minutes. The patient's level of consciousness and vital signs were monitored continuously by radiology nursing throughout the procedure under my direct supervision. CONTRAST:  79mL OMNIPAQUE IOHEXOL 300 MG/ML  SOLN FLUOROSCOPY TIME:  Fluoroscopy Time: 12 minutes 30 seconds (620 mGy). COMPLICATIONS: None immediate. PROCEDURE: Informed consent was obtained from the patient following explanation of the procedure, risks, benefits and alternatives. The patient understands, agrees and consents for the procedure. All questions were addressed. A time out was performed prior to the initiation of the procedure. Maximal barrier sterile technique utilized including caps, mask, sterile gowns, sterile gloves, large sterile drape, hand hygiene, and Betadine prep. The right groin was prepped and draped in a sterile fashion. 1% lidocaine was utilized for local anesthesia. Under sonographic guidance, a micropuncture needle was inserted into the  right common femoral artery. Sonographic documentation was obtained and the common femoral artery was noted to be patent. A 1 8 wire was up sized to a Bentson. Five French sheath was inserted. A 5 French pigtail catheter was advanced into the abdominal aorta and AP arteriography was performed. The pigtail was exchanged for a five [place] sauce Omni catheter was advanced over the wire into the thoracic aorta. The reverse curve was formed. The catheter was retracted. It was advanced over a Bentson wire into the left renal artery. Angiography was performed. A Renegade microcatheter was advanced through the sauce Omni catheter and over a 016 fathom wire into the peripheral branches of the left renal artery towards the upper pole. Contrast was injected confirming position in in the peripheral left renal arterial tree. Coil embolization was performed. Coiled utilized were 6 mm x 10 cm interlock, 6 mm x 20 cm interlock, 6 mm x 20 cm interlock. Repeat angiography through the microcatheter was performed. This demonstrated some  residual flow through the coil pack. Gel-Foam slurry was then injected through the microcatheter until stasis of flow was achieved. Microcatheter was removed. The soft was retracted into the right common iliac artery and right femoral angiography was performed. The catheter was removed. The sheath was flushed and sewn in place. Under sonographic guidance, a micropuncture needle was inserted into the left common femoral vein and removed over a 018 wire which was up sized to a 3 J. Sonographic documentation was obtained. The right common femoral vein was noted to be patent. A 7 French triple-lumen catheter was advanced over the 035 3 J wire. It was flushed and sewn in place. FINDINGS: Aortic arteriography demonstrates left renal artery anatomy. There is significant narrowing just beyond its takeoff. The right renal artery is not clearly visualized and most likely has been ligated. Selective arteriography  of the left renal artery delineates anatomy and demonstrates active extravasation involving the upper pole of the left kidney in multiple locations. Subsequent imaging demonstrates selective injection of third order branches of the left renal artery. Serial imaging demonstrates deposition of coils within the left upper pole renal artery branches extending into the main left renal artery. Final arteriography demonstrates occlusion of the left renal artery. Femoral arteriography delineates right femoral artery anatomy and sheath insertion. Final imaging demonstrates a right common femoral vein triple-lumen central venous catheter with its tip in the right common iliac vein. IMPRESSION: Successful coil embolization of the main left renal artery with stasis of flow and resolution of active extravasation of bleeding from the upper pole of the left kidney. Successful right common femoral vein central venous triple-lumen catheter. Electronically Signed   By: Marybelle Killings M.D.   On: 01/13/2018 13:50    Labs:  CBC: Recent Labs    01/20/18 0701 01/21/18 0936 01/22/18 0853 01/23/18 0629 01/23/18 1301 01/26/18 1812  WBC 9.9 9.8  --   --  9.4 11.7*  HGB 8.8* 8.5* 9.5* 8.8* 8.6* 9.4*  HCT 27.4* 26.9* 29.1* 26.7* 26.2* 29.6*  PLT 266 248  --   --  275 311    COAGS: Recent Labs    03/16/17 0900 03/17/17 0318  01/15/18 0400 01/16/18 0309 01/17/18 0534 01/27/18 0222  INR 1.40 1.73   < > 1.35 1.43 1.36 1.22  APTT 44* 52*  --   --   --   --   --    < > = values in this interval not displayed.    BMP: Recent Labs    01/21/18 0936 01/22/18 0853 01/23/18 0629 01/26/18 1812  NA 131* 132* 132* 135  K 3.7 4.3 5.1 4.6  CL 95* 96* 96* 92*  CO2 26 23 22 27   GLUCOSE 110* 103* 86 94  BUN 32* 53* 67* 36*  CALCIUM 9.5 10.4* 10.1 10.4*  CREATININE 7.46* 10.56* 12.60* 8.91*  GFRNONAA 7* 5* 4* 6*  GFRAA 9* 6* 5* 7*    LIVER FUNCTION TESTS: Recent Labs    03/16/17 0900 03/17/17 0318   01/12/18 2159  01/15/18 0400  01/20/18 0701 01/21/18 0936 01/22/18 0853 01/23/18 0629  BILITOT 0.5 0.6  --  0.9  --  1.1  --   --   --   --   --   AST 20 18  --  10*  --  20  --   --   --   --   --   ALT <5* <5*  --  8  --  25  --   --   --   --   --  ALKPHOS 86 80  --  68  --  70  --   --   --   --   --   PROT 8.2* 7.2  --  7.8  --  6.3*  --   --   --   --   --   ALBUMIN 3.3* 2.9*   < > 3.1*   < > 2.4*   < > 2.6* 2.5* 2.7* 2.5*   < > = values in this interval not displayed.    TUMOR MARKERS: No results for input(s): AFPTM, CEA, CA199, CHROMGRNA in the last 8760 hours.  Assessment and Plan:  Recent left renal hemorrhage secondary supra therapeutic INR Embolization in IR 8/23 Now re admitted for SOB +PE Cannot anticoagulate with recent renal bleed Scheduled now for retrievable Inferior vena cava filter placement Risks and benefits discussed with the patient's wife  (via phone)and sisters( in room) including, but not limited to bleeding, infection, contrast induced renal failure, filter fracture or migration which can lead to emergency surgery or even death, strut penetration with damage or irritation to adjacent structures and caval thrombosis.  All of the wifes questions were answered, she is agreeable to proceed. Consent signed and in chart.    Thank you for this interesting consult.  I greatly enjoyed meeting [name] and look forward to participating in their care.  A copy of this report was sent to the requesting provider on this date.  Electronically Signed: Hilmer Aliberti A, PA-C 01/27/2018, 11:00 AM   I spent a total of 40 Minutes    in face to face in clinical consultation, greater than 50% of which was counseling/coordinating care for IVC filter -- retrievable

## 2018-01-27 NOTE — Significant Event (Addendum)
Rapid Response Event Note  Overview: Time Called: 2211 Arrival Time: 2215 Event Type: Respiratory  Initial Focused Assessment: Called by RN about patient needing BIPAP and needing to be transferred to PCU for BIPAP. Per RN, MD was already at the bedside and orders had been placed. When I arrived, patient was slightly drowsy (per wife, hasn't slept in days and hasn't had an appetite either), would awaken, follows commands and is oriented but appeared in mild respiratory distress. RR was in the upper 40s and with calming him down, his RR still remained in the mid 31s. ABG was already ordered and being drawn when I arrived. After ABG was collected, patient was placed on BIPAP and transferred to 4E21. Lung sounds diminished in the lower fields, L>R. Patient endorsed 5/10 pain from his chest with inspiration and also had lower flank/back pain in the left (spontaneous hemorrhage of the Left Kidney in the setting of SUPRA-therapeutic INR). + LLL infilrate on CXR. SBP > 110, MAP > 65, saturations on [place] were 100%, RR was in the 35-38 range, hands and feet were very cool to touch, + pulses, capillary refill was normal.   Prior to transfer, the staff informed that patient needed a CTA of the chest, I was trying to coordinate the scan with his transfer, upon called CT, CT staff informed that patient had the scan and there was movement/artifact at the very end of scan and that MD ordered another. Radiologist was able to read CT and report was published so I informed TRH MD of that. Patient was taken to 4E on BIPAP.   Patient was transferred to 4E21, RR was still in the mid 30s on BIPAP 10/5. I paged the MD about medication for pain relief. Orders received for Morphine, MD informed me that he would talk to PCCM and that I could update the wife as well.   MD called back and informed me that patient will not be a candidate for anticoagulant therapy d/t recent bleed, will need a IVC placed, likely tomorrow, and PCCM MD  wanted RT to place patient on AVAPS on the BIPAP.   I was in the process of updating the RN and family and I was called to away to a Medical Emergency, when I came back at 2345, wife had left. RTs were able to work on the BIPAP settings, RN had given patient Morphine 1 mg IV, and RR was better, in the mid 20s, Patient appeared more comfortable and work of breathing had improved, tidal volume on BIPAP had improved. SBP was in the 90s, MAP > 65, saturations were 100% on 30% 15/5 BIPAP, and HR low 100s.   Interventions: - ABG/BIPAP were already ordered - Transfer 4E21  Plan of Care: - Will monitor patient, respiratory status, and hemodynamics closely. - Call RRT if needed.  - At [place] Patient's SBP in the 70-80s, MAP low 60s. RN did page TRH MD, orders received for 200cc NS bolus and Midodrine 10 mg x 1 (patient has history of hypotension and is on Midodrine at home). RN was able to wean patient off BIPAP for about 90 minutes, BP improved. - At around 430, I went to get an update, patient was back on the BIPAP, BP remained normotensive.  - At [phone], I called for an update, patient was transitioned to [place] and patient's saturations maintained and RR had improved.  Event Summary: Name of Physician Notified: TRH MD already at the bedside when RR RN arrived.  Name of Consulting Physician  Notified: TRH MD called Warren Lacy PCCM MD at [place]  Outcome: Transferred to 4E21  Event End Time: [place], [place]

## 2018-01-27 NOTE — Progress Notes (Signed)
Placed pt on BiPAP at this time in dialysis due to increased WOB and low sats, Pt tolerating well at this time. RT to continue to monitor as needed

## 2018-01-27 NOTE — Progress Notes (Signed)
Bilateral lower extremity venous duplex completed. - Preliminary results. Right - There is no evidence of DVt or Baker's cyst. Left - Positive for an acute DVT noted in the peroneal and posterior tibial veins. There is no evidence of a Baker's cyst. [place] Fawne Hughley,RVS 01/27/2018 2:01 PM

## 2018-01-27 NOTE — Progress Notes (Signed)
@  0030 Paged Attending, Dr. Blaine Hamper, regarding pt's hypotension (80s/50s-60s, MAPs 60s). Orders received to administer 1 dose of Midodrine now and 24mL bolus.   @0135  Re-paged Dr. Blaine Hamper, regarding persistent hypotension 70s-80s/50s-60s, MAPs 60s) despite above interventions. Pt resting comfortably on 2L [place] with RR in mid-20s. Instructions received to take blood pressure in all available extremities and page back with results. BPs are as follows:  R (Upper) Arm 78/65 (MAP 70) R Wrist 117/82 (MAP 94) R Leg (Ankle) 132/79 (MAP 94) L Leg (Ankle) 139/86 (MAP 103)

## 2018-01-27 NOTE — Progress Notes (Addendum)
[facility]  PROGRESS NOTE  [name] IDP:[id number] DOB: August 26, 1960 DOA: 01/26/2018 PCP: Iona Beard, MD   Brief HPI:   57 year old male with a history of ESRD on hemodialysis Monday Wednesday Friday, hypertension, CVA, GERD, gout, atrial fibrillation not on anticoagulation, systolic CHF with EF 44%, CAD, status post left nephrectomy for spontaneous hemorrhage in the left kidney in the setting of supratherapeutic INR.  Came to hospital with worsening shortness of breath. CTA showed possibility of pulmonary embolism, pneumonia and also not excluded.  On IV antibiotics.  Chest x-ray also showed mild interstitial edema.    Subjective   This morning patient complains of shortness of breath, left flank pain.  Denies nausea vomiting   Assessment/Plan:     1. Acute respiratory failure with hypoxia-likely due to pulmonary embolism, cannot use anticoagulation due to recent renal hemorrhage.  Admitting physician discussed with PCCM last night who recommended IVC filter placement.  Patient also started on vancomycin and cefepime for pneumonia, procalcitonin elevated to 4.02, lactic acid 2.75.  2. Pulmonary embolism-seen on CTA chest, IVC filter will be placed as patient is a high risk for bleed due to recent left renal hemorrhage.  Will not start anticoagulation at this time.  This was discussed by admitting physician with PCCM Dr. Lucile Shutters last night.    3. Healthcare associated pneumonia-concern for pneumonia infectious process.  Significant elevation of procalcitonin 4.02, lactic acid 2.75.  Started on vancomycin and cefepime.  Will continue with antibiotics follow blood culture results.  4. Chronic combined systolic and diastolic CHF-chest x-ray showed mild edema, 2D echo from October 2018 showed EF 30 to 35% with grade 1 diastolic dysfunction.  I have consulted nephrology for dialysis today.  5. ESRD on hemodialysis-patient gets similar to Friday, consulted nephrology for dialysis today.   Continue midodrine for hypotension  6. Recent history of hemorrhage of left kidney-CT scan of the abdomen showed unchanged appearance of moderate to large left renal and perinephric hematoma/hemorrhage descending into the pelvis.  Hemoglobin is stable at 9.4  7. CAD-stable  8. Atrial fibrillation- CHA2DS2VASc score is 5, patient needs oral anticoagulation but due to recent renal hemorrhage will not be able to start anticoagulation.  Heart rate controlled.  Continue amiodarone.  Telemetry monitoring.  9. Anemia-continue iron supplementation  10. Depression-continue mirtazapine  11. GERD-continue Protonix twice daily     CBG: Recent Labs  Lab 01/23/18 0017 01/23/18 0414 01/23/18 0757 01/23/18 1217 01/23/18 1555  GLUCAP 108* 72 79 95 109*    CBC: Recent Labs  Lab 01/21/18 0936 01/22/18 0853 01/23/18 0629 01/23/18 1301 01/26/18 1812  WBC 9.8  --   --  9.4 11.7*  NEUTROABS 7.2  --   --   --   --   HGB 8.5* 9.5* 8.8* 8.6* 9.4*  HCT 26.9* 29.1* 26.7* 26.2* 29.6*  MCV 98.9  --   --  94.9 97.4  PLT 248  --   --  275 315    Basic Metabolic Panel: Recent Labs  Lab 01/21/18 0936 01/22/18 0853 01/23/18 0629 01/26/18 1812  NA 131* 132* 132* 135  K 3.7 4.3 5.1 4.6  CL 95* 96* 96* 92*  CO2 26 23 22 27   GLUCOSE 110* 103* 86 94  BUN 32* 53* 67* 36*  CREATININE 7.46* 10.56* 12.60* 8.91*  CALCIUM 9.5 10.4* 10.1 10.4*  PHOS 3.7 5.0* 5.6*  --      DVT prophylaxis: No DVT prophylaxis as venous duplex of lower extremities pending to rule out DVT,  cannot use heparin or Lovenox due to recent renal hemorrhage  Code Status: Full code  Family Communication: No family at bedside  Disposition Plan: likely home when medically ready for discharge   Consultants:  None  Procedures:  None   Antibiotics:   Anti-infectives (From admission, onward)   Start     Dose/Rate Route Frequency Ordered Stop   01/27/18 2000  ceFEPIme (MAXIPIME) 1 g in sodium chloride 0.9 % 100 mL  IVPB     1 g 200 mL/hr over 30 Minutes Intravenous Every 24 hours 01/26/18 1942     01/27/18 1200  vancomycin (VANCOCIN) IVPB 1000 mg/200 mL premix     1,000 mg 200 mL/hr over 60 Minutes Intravenous Every M-W-F (Hemodialysis) 01/26/18 1942     01/26/18 2000  vancomycin (VANCOCIN) 2,000 mg in sodium chloride 0.9 % 500 mL IVPB     2,000 mg 250 mL/hr over 120 Minutes Intravenous  Once 01/26/18 1931 01/26/18 2121   01/26/18 1930  vancomycin (VANCOCIN) IVPB 1000 mg/200 mL premix  Status:  Discontinued     1,000 mg 200 mL/hr over 60 Minutes Intravenous  Once 01/26/18 1923 01/26/18 1931   01/26/18 1930  ceFEPIme (MAXIPIME) 2 g in sodium chloride 0.9 % 100 mL IVPB     2 g 200 mL/hr over 30 Minutes Intravenous  Once 01/26/18 1923 01/26/18 2122       Objective   Vitals:   01/27/18 0300 01/27/18 0317 01/27/18 0438 01/27/18 0645  BP: 126/75  118/71 134/72  Pulse: 94 94 89 86  Resp: (!) 29 19 20  (!) 21  Temp:   98.4 F (36.9 C)   TempSrc:   Axillary   SpO2: 99% 98% 99% 99%  Weight:      Height:        Intake/Output Summary (Last 24 hours) at 01/27/2018 4268 Last data filed at 01/27/2018 0050 Gross per 24 hour  Intake 777.17 ml  Output -  Net 777.17 ml   Filed Weights   01/26/18 1758 01/26/18 2310  Weight: 113.4 kg 114.3 kg     Physical Examination:    General: Appears in no acute distress  Cardiovascular: S1-S2, regular, no murmurs auscultated  Respiratory: Scattered bilateral rhonchi  Abdomen: Soft, nontender, no organomegaly  Extremities: Trace edema of the lower extremities  Neurologic: Alert, oriented x3, no focal deficit noted     Data Reviewed: I have personally reviewed following labs and imaging studies   No results found for this or any previous visit (from the past 240 hour(s)).   Liver Function Tests: Recent Labs  Lab 01/21/18 0936 01/22/18 0853 01/23/18 0629  ALBUMIN 2.5* 2.7* 2.5*   No results for input(s): LIPASE, AMYLASE in the last 168  hours. No results for input(s): AMMONIA in the last 168 hours.  Cardiac Enzymes: No results for input(s): CKTOTAL, CKMB, CKMBINDEX, TROPONINI in the last 168 hours. BNP (last 3 results) Recent Labs    07/18/17 0531  BNP 1,325.2*    ProBNP (last 3 results) No results for input(s): PROBNP in the last 8760 hours.    Studies: Dg Chest 2 View  Result Date: 01/26/2018 CLINICAL DATA:  Shortness of breath for 1 day EXAM: CHEST - 2 VIEW COMPARISON:  January 14, 2018 FINDINGS: The heart size is enlarged. Mediastinal contour is normal. There is patchy consolidation of left lung base. There is mild deep diffuse increased pulmonary interstitium bilaterally. There is no pulmonary edema. IMPRESSION: Patchy consolidation of left lung base suspicious for pneumonia.  Mild interstitial edema. Electronically Signed   By: Abelardo Diesel M.D.   On: 01/26/2018 18:38   Ct Angio Chest Pe W/cm &/or Wo Cm  Result Date: 01/26/2018 CLINICAL DATA:  57 year old male with acute chest pain and shortness of breath. History of recent LEFT renal hemorrhage. EXAM: CT ANGIOGRAPHY CHEST WITH CONTRAST TECHNIQUE: Multidetector CT imaging of the chest was performed using the standard protocol during bolus administration of intravenous contrast. Multiplanar CT image reconstructions and MIPs were obtained to evaluate the vascular anatomy. CONTRAST:  172mL ISOVUE-370 IOPAMIDOL (ISOVUE-370) INJECTION 76% COMPARISON:  03/15/2016 chest CT FINDINGS: Cardiovascular: This is a technically suboptimal study due to extensive respiratory motion artifact and less than optimal contrast opacification of the pulmonary arteries. The study was to be repeated but patient had been sent to his hospital room where he became unstable. Probable filling defects/pulmonary emboli within 2 RIGHT UPPER lobe segments. The LOWER lobe pulmonary arteries are not well evaluated due to extensive motion artifact. Cardiomegaly noted without CT evidence of RIGHT heart strain.  Coronary artery and aortic atherosclerotic calcifications identified without aortic aneurysm. No pericardial effusion. Mediastinum/Nodes: No enlarged mediastinal, hilar, or axillary lymph nodes. Thyroid gland, trachea, and esophagus demonstrate no significant findings. Lungs/Pleura: Mild to moderate bibasilar atelectasis noted. No pleural effusion or pneumothorax. Upper Abdomen: LEFT renal/perinephric hemorrhage again noted. Musculoskeletal: No acute or suspicious bony abnormalities. Review of the MIP images confirms the above findings. IMPRESSION: 1. Suboptimal study but probable RIGHT UPPER lobe pulmonary emboli. LOWER lung pulmonary arteries are not well evaluated due to motion. Bibasilar atelectasis 2. Cardiomegaly without CT evidence of RIGHT heart strain 3. Coronary artery disease and Aortic Atherosclerosis (ICD10-I70.0). Critical Value/emergent results were called by telephone at the time of interpretation on 01/26/2018 at 10:57 pm to [facility], RN for this patient, who verbally acknowledged these results. Electronically Signed   By: Margarette Canada M.D.   On: 01/26/2018 22:59   Ct Abdomen Pelvis W Contrast  Result Date: 01/26/2018 CLINICAL DATA:  57 year old male with continued RIGHT flank and abdominal/pelvic pain. History of recent embolization of active LEFT renal hemorrhage. CTA chest will be dictated in a separate report as repeat imaging will be performed. EXAM: CT ABDOMEN AND PELVIS WITH CONTRAST TECHNIQUE: Multidetector CT imaging of the abdomen and pelvis was performed using the standard protocol following bolus administration of intravenous contrast. CONTRAST:  134mL ISOVUE-370 IOPAMIDOL (ISOVUE-370) INJECTION 76% COMPARISON:  01/13/2018 CT FINDINGS: Lower chest: Bibasilar atelectasis noted. Hepatobiliary: The liver is unremarkable except for a small LEFT hepatic cyst. The patient is status post cholecystectomy. No biliary dilatation. Pancreas: Unremarkable Spleen: Unremarkable Adrenals/Urinary Tract:  Moderate to large LEFT renal and perinephric hematoma/hemorrhage extending into the pelvis is not significantly changed in appearance. Embolization coils in the region of the LEFT renal artery noted. The patient is status post RIGHT nephrectomy. The adrenal glands are unremarkable.  The bladder is collapsed. Stomach/Bowel: Stomach is within normal limits. Appendix appears normal. No evidence of bowel wall thickening, distention, or inflammatory changes. Vascular/Lymphatic: Aortic atherosclerosis. No enlarged abdominal or pelvic lymph nodes. Reproductive: Prostate is unremarkable. Other: No evidence of abscess, pneumoperitoneum or ascites. Musculoskeletal: No acute or suspicious bony abnormalities. IMPRESSION: 1. Unchanged appearance of moderate to large LEFT renal and perinephric hematoma/hemorrhage descending into the pelvis. 2. Bibasilar atelectasis 3.  Aortic Atherosclerosis (ICD10-I70.0). CTA of the chest will be dictated in a separate report. Electronically Signed   By: Margarette Canada M.D.   On: 01/26/2018 22:06    Scheduled Meds: . allopurinol  150 mg Oral QODAY  . amiodarone  100 mg Oral Daily  . calcium acetate  2,001 mg Oral TID WC  . chlorhexidine  15 mL Mouth Rinse BID  . Chlorhexidine Gluconate Cloth  6 each Topical Q0600  . ferric citrate  210 mg Oral TID WC  . mouth rinse  15 mL Mouth Rinse q12n4p  . midodrine  10 mg Oral TID WC  . mirtazapine  7.5 mg Oral QHS  . multivitamin  1 tablet Oral QHS  . pantoprazole  40 mg Oral BID      Time spent: 25 min  [facility] Pager [phone]. If 7PM-7AM, please contact night-coverage at [email or web address], Office  [phone]  password [email or web address]  01/27/2018, 9:27 AM  LOS: 1 day

## 2018-01-27 NOTE — Progress Notes (Signed)
Transported patient back to his room placed on [place] 5L at this time. MD at bedside in dialysis, patient continues to refuse BIPAP at this time complaint of chest pain, RCP will continue to follow.

## 2018-01-27 NOTE — Significant Event (Signed)
Rapid Response Event Note  Overview: Called to HD for pt with increased WOB/pleurtic chest pain. Pt placed on bipap around 1940 d/t inc WOB.  Pt began c/o chest pain around 2100. Time Called: 2101 Arrival Time: 2105 Event Type: Respiratory, Cardiac  Initial Focused Assessment: Pt laying in bed, alert and oriented. HR-100s, BP-147/85, RR-50s, 91% on bipap.  Pt c/o R sided pleurtic chest pain 3/10.  Lungs diminished t/o. Pt will slow breathing down to 40s when prompted but RR increases back to 50s with no stimulation.   Pt demanding to take bipap off.  SpO2-90s, RR-50s off bipap.  PCXR/ABG done. Pt given 2 mg morphine for pain.  Pt says pain is better but rates it as 5/10.  Interventions: PCXR-L base atelectasis vs infiltrate ABG-7.45/40.3/72/27.9 2mg  morphine given PCCM to be consulted by Kennon Holter, NP Plan of Care (if not transferred): Await any orders after PCCM consulted. Continue to monitor pt.   Call RRT if further assistance needed. Event Summary: Name of Physician Notified: Kennon Holter, NP at 2129  Name of Consulting Physician Notified: [facility] MD called Warren Lacy PCCM MD at    Outcome: Stayed in room and stabalized  Event End Time: 2145  Dillard [place]

## 2018-01-27 NOTE — Procedures (Signed)
IVC filter placement EBL 0 Comp 0

## 2018-01-27 NOTE — Progress Notes (Addendum)
[facility] Progress Note Patient Name: [name] DOB: 1960-11-26 MRN: [id number]   Date of Service  01/27/2018  HPI/Events of Note  Acute blood loss anemia, hypovolemia from drainage of gastric secretions. Tachypnea and reduced tidal volumes on current BIPAP settings.  eICU Interventions  Leave on Hospitalist service for now, increase pressure support targeting a tidal volume of 550 mls        [place] U Ogan 01/27/2018, 1:17 AM

## 2018-01-27 NOTE — Progress Notes (Signed)
RT called to patient room due to patient needing a PRN albuterol treatment.  Upon arrival, patient noted to be using accessory muscles and in distress.  Gave patient PRN with only slight relief.  Placed patient back on bipap and is currently tolerating well.  Will continue to monitor.

## 2018-01-27 NOTE — Plan of Care (Signed)

## 2018-01-27 NOTE — Consult Note (Addendum)
[facility] Renal Consultation Note    Indication for Consultation:  Management of ESRD/hemodialysis, anemia, hypertension/volume, and secondary hyperparathyroidism. PCP:  HPI: [name] is a 57 y.o. male with ESRD, Hx R nephrectomy (d/t RCC), CHF (EF 30-35%), CAD, Hx CVA, Hx atrial flutter, and recent admit 8/23 - 01/23/18 with L renal hemorrhage s/p IR embolization and discontinuation of warfarin.   Discharged home on 9/2 this week. Says did ok for a few days. Went to his usual HD on Wed 9/4 without issues. On Thursday (9/5), he developed acute sharp R posterior pleuritic CP and SOB. Presented to the ED. EKG + sinus tachycardia. Temp 47F. Labs showed WBC 11.7, Hgb 9.4, K 4.6. CXR showed patchy L base consolidation concerning for pnuemonia. He was started on antibiotics. Overnight, he underwent CT angio of chest and abdomen which showed unchanged L renal hematoma, but NEW RUL pulmonary embolii. Around 10:15p, he developed worsened SOB requiring short-term use of Bi-pap. Per notes, he is unable to be anticoagulated d/t recent renal hemorrhage, will have IVC filter placed at some point.  Dialyzes on MWF at [facility] via AVF. Last dialyzed on 01/25/18, due today. At this time, he is still dyspneic, unable to really make it through a full sentence. No CP at rest, only with deep breathing. No N/V or diarrhea. No fever or chills.  Past Medical History:  Diagnosis Date  . Atrial flutter ([place])   . 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 Cardiomyopathy   . CHF (congestive heart failure) ([place])   . Coronary artery disease    normal coronaries by 10/10/08 cath, Cardiac Cath 08-04-12 epic.Dr. Doylene Canard follows  . Diabetes mellitus    pt. states he's borderline diabetic., no longer taking med,- off med. since 2013  . Dialysis patient [facility])    Mon-Wed-Fri([place])- Left AV fistula  . Diverticulitis November 2016   reoccurred in December 2016  . ESRD (end stage  renal disease) ([place])   . GERD (gastroesophageal reflux disease)   . Gout   . History of nephrectomy 07/04/2012   History of right nephrectomy in 2002 for renal cell carcinoma   . History of unilateral nephrectomy   . Hypertension   . Low iron   . Myocardial infarction [facility]) ?2006  . Renal cell carcinoma    dialysis- MWF- Dr. Mercy Moore follows.  . Renal insufficiency   . Shortness of breath 05/19/11   "at rest, lying down, w/exertion"  . Stroke [facility]) 02/2011   05/19/11 denies residual  . Umbilical hernia 62/69/48   unrepaired   Past Surgical History:  Procedure Laterality Date  . AV FISTULA PLACEMENT  03/29/2011   Procedure: ARTERIOVENOUS (AV) FISTULA CREATION;  Surgeon: Hinda Lenis, MD;  Location: [place];  Service: Vascular;  Laterality: Left;  LEFT RADIOCEPHALIC , Arteriovenous NIOEVOJ([id number])  . CARDIAC CATHETERIZATION  05/21/11  . CARDIAC CATHETERIZATION N/A 03/16/2016   Procedure: Left Heart Cath and Coronary Angiography;  Surgeon: Dixie Dials, MD;  Location: [place] CV LAB;  Service: Cardiovascular;  Laterality: N/A;  . CHOLECYSTECTOMY N/A 02/12/2016   Procedure: LAPAROSCOPIC CHOLECYSTECTOMY WITH INTRAOPERATIVE CHOLANGIOGRAM;  Surgeon: Jackolyn Confer, MD;  Location: [place];  Service: General;  Laterality: N/A;  . COLONOSCOPY WITH PROPOFOL N/A 02/01/2017   Procedure: COLONOSCOPY WITH PROPOFOL;  Surgeon: Juanita Craver, MD;  Location: [facility] ENDOSCOPY;  Service: Endoscopy;  Laterality: N/A;  . dialysis cath placed    . ESOPHAGOGASTRODUODENOSCOPY (EGD) WITH PROPOFOL N/A 12/02/2015  Procedure: ESOPHAGOGASTRODUODENOSCOPY (EGD) WITH PROPOFOL;  Surgeon: Juanita Craver, MD;  Location: [facility] END[facility];  Service: Endoscopy;  Laterality: N/A;  . FINGER SURGERY     L pinkie finger- ORIF- /w remaining hardware - 1990's    . HERNIA REPAIR N/A 2/45/8099   Umbilical hernia repair  . INSERTION OF DIALYSIS CATHETER N/A 01/27/2016   Procedure: INSERTION OF DIALYSIS CATHETER;  Surgeon: Waynetta Sandy, MD;  Location: [place];  Service: Vascular;  Laterality: N/A;  . INSERTION OF MESH N/A 07/04/2012   Procedure: INSERTION OF MESH;  Surgeon: Madilyn Hook, DO;  Location: [place];  Service: General;  Laterality: N/A;  . IR EMBO ART  VEN HEMORR LYMPH EXTRAV  INC GUIDE ROADMAPPING  01/13/2018  . IR FLUORO GUIDE CV LINE RIGHT  01/13/2018  . IR RENAL SELECTIVE  UNI INC S&I MOD SED  01/13/2018  . IR US GUIDE VASC ACCESS RIGHT  01/13/2018  . LAPAROSCOPIC LYSIS OF ADHESIONS N/A 02/12/2016   Procedure: LAPAROSCOPIC LYSIS OF ADHESIONS;  Surgeon: Jackolyn Confer, MD;  Location: [place];  Service: General;  Laterality: N/A;  . LEFT AND RIGHT HEART CATHETERIZATION WITH CORONARY ANGIOGRAM N/A 08/04/2012   Procedure: LEFT AND RIGHT HEART CATHETERIZATION WITH CORONARY ANGIOGRAM;  Surgeon: Birdie Riddle, MD;  Location: [place] CATH LAB;  Service: Cardiovascular;  Laterality: N/A;  . NEPHRECTOMY  2000   right  . REVISON OF ARTERIOVENOUS FISTULA Left 06/05/2013   Procedure: REVISON OF LEFT RADIOCEPHALIC  ARTERIOVENOUS FISTULA;  Surgeon: Conrad , MD;  Location: [place];  Service: Vascular;  Laterality: Left;  . REVISON OF ARTERIOVENOUS FISTULA Left 01/27/2016   Procedure: REVISION OF LEFT UPPER EXTREMITY ARTERIOVENOUS FISTULA;  Surgeon: Waynetta Sandy, MD;  Location: [place];  Service: Vascular;  Laterality: Left;  . REVISON OF ARTERIOVENOUS FISTULA Left 08/03/2016   Procedure: REVISON OF Left arm ARTERIOVENOUS FISTULA;  Surgeon: Elam Dutch, MD;  Location: [place];  Service: Vascular;  Laterality: Left;  . REVISON OF ARTERIOVENOUS FISTULA Left 05/06/2017   Procedure: REVISION PLICATION OF ARTERIOVENOUS FISTULA LEFT ARM;  Surgeon: Rosetta Posner, MD;  Location: [place];  Service: Vascular;  Laterality: Left;  . RIGHT HEART CATHETERIZATION N/A 05/21/2011   Procedure: RIGHT HEART CATH;  Surgeon: Birdie Riddle, MD;  Location: [facility] CATH LAB;  Service: Cardiovascular;  Laterality: N/A;  . smashed  1990's    "left pinky; have a plate in there"  . UMBILICAL HERNIA REPAIR N/A 07/04/2012   Procedure: LAPAROSCOPIC UMBILICAL HERNIA;  Surgeon: Madilyn Hook, DO;  Location: [place];  Service: General;  Laterality: N/A;  . US ECHOCARDIOGRAPHY  05/20/11   Family History  Problem Relation Age of Onset  . Hypertension Mother   . Kidney disease Mother    Social History:  reports that he has never smoked. He has never used smokeless tobacco. He reports that he does not drink alcohol or use drugs.  ROS: As per HPI otherwise negative.  Physical Exam: Vitals:   01/27/18 0300 01/27/18 0317 01/27/18 0438 01/27/18 0645  BP: 126/75  118/71 134/72  Pulse: 94 94 89 86  Resp: (!) 29 19 20  (!) 21  Temp:   98.4 F (36.9 C)   TempSrc:   Axillary   SpO2: 99% 98% 99% 99%  Weight:      Height:         General: Well developed, well nourished. Increased respiratory effort Head: Normocephalic, atraumatic, sclera non-icteric, mucus membranes are moist. Neck: Supple without lymphadenopathy/masses. JVD  not elevated. Lungs: Dull in B bases, pain with inspiratory so unable to get great exam. Heart: RRR with normal S1, S2. No murmurs, rubs, or gallops appreciated. Abdomen: Soft, mild tenderness with palpation. Musculoskeletal:  Strength and tone appear normal for age. Lower extremities: No LE edema or ischemic changes, no open wounds. Neuro: Alert and oriented X 3. Moves all extremities spontaneously. Psych:  Responds to questions appropriately with a normal affect. Dialysis Access: AVF + thrill  Allergies  Allergen Reactions  . Ace Inhibitors Itching and Cough   Prior to Admission medications   Medication Sig Start Date End Date Taking? Authorizing Provider  allopurinol (ZYLOPRIM) 300 MG tablet Take 150 mg by mouth every other day.   Yes [provider]  amiodarone (PACERONE) 200 MG tablet Take 0.5 tablets (100 mg total) by mouth daily. 07/21/17  Yes Regalado, Belkys A, MD  calcium acetate (PHOSLO) 667 MG  capsule Take 667-2,001 mg by mouth See admin instructions. Take 3 capsules with meals and 1 capsule with snacks. 01/24/18  Yes [provider]  ferric citrate (AURYXIA) 1 GM 210 MG(Fe) tablet Take 210 mg by mouth 3 (three) times daily with meals.    Yes [provider]  lidocaine-prilocaine (EMLA) cream Apply 1 application topically See admin instructions. Apply topically to port access prior to dialysis - Monday, Wednesday, Friday 04/08/15  Yes [provider]  midodrine (PROAMATINE) 10 MG tablet Take 0.5-1 tablets (5-10 mg total) by mouth See admin instructions. Take one tablet (10 mg) by mouth before dialysis (Monday, Wednesday, Friday), take 1/2 tablet (5 mg) on non-dialysis days (Sunday, Tuesday, Thursday, Saturday) as needed for diastolic blood pressure <40. 01/23/18  Yes Emokpae, Courage, MD  mirtazapine (REMERON) 7.5 MG tablet Take 1 tablet (7.5 mg total) by mouth at bedtime. 01/23/18  Yes Emokpae, Courage, MD  multivitamin (RENA-VIT) TABS tablet Take 1 tablet by mouth at bedtime.   Yes [provider]  nitroGLYCERIN (NITROSTAT) 0.4 MG SL tablet Place 0.4 mg under the tongue every 5 (five) minutes as needed for chest pain.  05/20/15  Yes [provider]  pantoprazole (PROTONIX) 40 MG tablet Take 40 mg by mouth 2 (two) times daily.   Yes [provider]   Current Facility-Administered Medications  Medication Dose Route Frequency Provider Last Rate Last Dose  . acetaminophen (TYLENOL) tablet 650 mg  650 mg Oral Q6H PRN Ivor Costa, MD   650 mg at 01/27/18 0238  . albuterol (PROVENTIL) (2.5 MG/3ML) 0.083% nebulizer solution 2.5 mg  2.5 mg Nebulization Q4H PRN Ivor Costa, MD   2.5 mg at 01/26/18 2159  . allopurinol (ZYLOPRIM) tablet 150 mg  150 mg Oral Henreitta Cea, MD   150 mg at 01/27/18 0953  . amiodarone (PACERONE) tablet 100 mg  100 mg Oral Daily Ivor Costa, MD   100 mg at 01/27/18 0955  . calcium acetate (PHOSLO) capsule 2,001 mg  2,001 mg  Oral TID WC Ivor Costa, MD   2,001 mg at 01/27/18 0954  . ceFEPIme (MAXIPIME) 1 g in sodium chloride 0.9 % 100 mL IVPB  1 g Intravenous Q24H Ivor Costa, MD      . chlorhexidine (PERIDEX) 0.12 % solution 15 mL  15 mL Mouth Rinse BID Ivor Costa, MD   15 mL at 01/27/18 0954  . Chlorhexidine Gluconate Cloth 2 % PADS 6 each  6 each Topical Q0600 Stovall, Kathryn R, PA-C      . dextromethorphan-guaiFENesin (MUCINEX DM) 30-600 MG per 12 hr  tablet 1 tablet  1 tablet Oral BID PRN Ivor Costa, MD      . ferric citrate (AURYXIA) tablet 210 mg  210 mg Oral TID WC Ivor Costa, MD   210 mg at 01/27/18 0954  . hydrALAZINE (APRESOLINE) injection 5 mg  5 mg Intravenous Q2H PRN Ivor Costa, MD      . MEDLINE mouth rinse  15 mL Mouth Rinse q12n4p Ivor Costa, MD      . midodrine (PROAMATINE) tablet 10 mg  10 mg Oral TID WC Ivor Costa, MD   10 mg at 01/27/18 0953  . mirtazapine (REMERON) tablet 7.5 mg  7.5 mg Oral QHS Ivor Costa, MD      . morphine 2 MG/ML injection 1 mg  1 mg Intravenous Q4H PRN Ivor Costa, MD   1 mg at 01/27/18 1000  . multivitamin (RENA-VIT) tablet 1 tablet  1 tablet Oral QHS Ivor Costa, MD      . nitroGLYCERIN (NITROSTAT) SL tablet 0.4 mg  0.4 mg Sublingual Q5 min PRN Ivor Costa, MD      . ondansetron [facility]) injection 4 mg  4 mg Intravenous Q8H PRN Ivor Costa, MD      . oxyCODONE-acetaminophen (PERCOCET/ROXICET) 5-325 MG per tablet 1 tablet  1 tablet Oral Q4H PRN Ivor Costa, MD   1 tablet at 01/27/18 [phone]  . pantoprazole (PROTONIX) EC tablet 40 mg  40 mg Oral BID Ivor Costa, MD   40 mg at 01/27/18 0955  . vancomycin (VANCOCIN) IVPB 1000 mg/200 mL premix  1,000 mg Intravenous Q M,W,F-HD Ivor Costa, MD      . zolpidem [facility]) tablet 5 mg  5 mg Oral QHS PRN Ivor Costa, MD   5 mg at 01/26/18 2200   Labs: Basic Metabolic Panel: Recent Labs  Lab 01/21/18 0936 01/22/18 0853 01/23/18 0629 01/26/18 1812  NA 131* 132* 132* 135  K 3.7 4.3 5.1 4.6  CL 95* 96* 96* 92*  CO2 26 23 22 27   GLUCOSE 110*  103* 86 94  BUN 32* 53* 67* 36*  CREATININE 7.46* 10.56* 12.60* 8.91*  CALCIUM 9.5 10.4* 10.1 10.4*  PHOS 3.7 5.0* 5.6*  --    Liver Function Tests: Recent Labs  Lab 01/21/18 0936 01/22/18 0853 01/23/18 0629  ALBUMIN 2.5* 2.7* 2.5*   CBC: Recent Labs  Lab 01/21/18 0936  01/23/18 0629 01/23/18 1301 01/26/18 1812  WBC 9.8  --   --  9.4 11.7*  NEUTROABS 7.2  --   --   --   --   HGB 8.5*   < > 8.8* 8.6* 9.4*  HCT 26.9*   < > 26.7* 26.2* 29.6*  MCV 98.9  --   --  94.9 97.4  PLT 248  --   --  275 311   < > = values in this interval not displayed.   CBG: Recent Labs  Lab 01/23/18 0017 01/23/18 0414 01/23/18 0757 01/23/18 1217 01/23/18 1555  GLUCAP 108* 72 79 95 109*   Studies/Results: Dg Chest 2 View  Result Date: 01/26/2018 CLINICAL DATA:  Shortness of breath for 1 day EXAM: CHEST - 2 VIEW COMPARISON:  January 14, 2018 FINDINGS: The heart size is enlarged. Mediastinal contour is normal. There is patchy consolidation of left lung base. There is mild deep diffuse increased pulmonary interstitium bilaterally. There is no pulmonary edema. IMPRESSION: Patchy consolidation of left lung base suspicious for pneumonia. Mild interstitial edema. Electronically Signed   By: Abelardo Diesel M.D.   On:  01/26/2018 18:38   Ct Angio Chest Pe W/cm &/or Wo Cm  Result Date: 01/26/2018 CLINICAL DATA:  57 year old male with acute chest pain and shortness of breath. History of recent LEFT renal hemorrhage. EXAM: CT ANGIOGRAPHY CHEST WITH CONTRAST TECHNIQUE: Multidetector CT imaging of the chest was performed using the standard protocol during bolus administration of intravenous contrast. Multiplanar CT image reconstructions and MIPs were obtained to evaluate the vascular anatomy. CONTRAST:  144mL ISOVUE-370 IOPAMIDOL (ISOVUE-370) INJECTION 76% COMPARISON:  03/15/2016 chest CT FINDINGS: Cardiovascular: This is a technically suboptimal study due to extensive respiratory motion artifact and less than  optimal contrast opacification of the pulmonary arteries. The study was to be repeated but patient had been sent to his hospital room where he became unstable. Probable filling defects/pulmonary emboli within 2 RIGHT UPPER lobe segments. The LOWER lobe pulmonary arteries are not well evaluated due to extensive motion artifact. Cardiomegaly noted without CT evidence of RIGHT heart strain. Coronary artery and aortic atherosclerotic calcifications identified without aortic aneurysm. No pericardial effusion. Mediastinum/Nodes: No enlarged mediastinal, hilar, or axillary lymph nodes. Thyroid gland, trachea, and esophagus demonstrate no significant findings. Lungs/Pleura: Mild to moderate bibasilar atelectasis noted. No pleural effusion or pneumothorax. Upper Abdomen: LEFT renal/perinephric hemorrhage again noted. Musculoskeletal: No acute or suspicious bony abnormalities. Review of the MIP images confirms the above findings. IMPRESSION: 1. Suboptimal study but probable RIGHT UPPER lobe pulmonary emboli. LOWER lung pulmonary arteries are not well evaluated due to motion. Bibasilar atelectasis 2. Cardiomegaly without CT evidence of RIGHT heart strain 3. Coronary artery disease and Aortic Atherosclerosis (ICD10-I70.0). Critical Value/emergent results were called by telephone at the time of interpretation on 01/26/2018 at 10:57 pm to [facility], RN for this patient, who verbally acknowledged these results. Electronically Signed   By: Margarette Canada M.D.   On: 01/26/2018 22:59   Ct Abdomen Pelvis W Contrast  Result Date: 01/26/2018 CLINICAL DATA:  57 year old male with continued RIGHT flank and abdominal/pelvic pain. History of recent embolization of active LEFT renal hemorrhage. CTA chest will be dictated in a separate report as repeat imaging will be performed. EXAM: CT ABDOMEN AND PELVIS WITH CONTRAST TECHNIQUE: Multidetector CT imaging of the abdomen and pelvis was performed using the standard protocol following bolus  administration of intravenous contrast. CONTRAST:  136mL ISOVUE-370 IOPAMIDOL (ISOVUE-370) INJECTION 76% COMPARISON:  01/13/2018 CT FINDINGS: Lower chest: Bibasilar atelectasis noted. Hepatobiliary: The liver is unremarkable except for a small LEFT hepatic cyst. The patient is status post cholecystectomy. No biliary dilatation. Pancreas: Unremarkable Spleen: Unremarkable Adrenals/Urinary Tract: Moderate to large LEFT renal and perinephric hematoma/hemorrhage extending into the pelvis is not significantly changed in appearance. Embolization coils in the region of the LEFT renal artery noted. The patient is status post RIGHT nephrectomy. The adrenal glands are unremarkable.  The bladder is collapsed. Stomach/Bowel: Stomach is within normal limits. Appendix appears normal. No evidence of bowel wall thickening, distention, or inflammatory changes. Vascular/Lymphatic: Aortic atherosclerosis. No enlarged abdominal or pelvic lymph nodes. Reproductive: Prostate is unremarkable. Other: No evidence of abscess, pneumoperitoneum or ascites. Musculoskeletal: No acute or suspicious bony abnormalities. IMPRESSION: 1. Unchanged appearance of moderate to large LEFT renal and perinephric hematoma/hemorrhage descending into the pelvis. 2. Bibasilar atelectasis 3.  Aortic Atherosclerosis (ICD10-I70.0). CTA of the chest will be dictated in a separate report. Electronically Signed   By: Margarette Canada M.D.   On: 01/26/2018 22:06   Home meds:  - amiodarone 100 qd/ midodrine 10 preHD tiw+ 5mg  qd prn/ sl NTG prn  - allopurinol 150  qd/ calc acetate ac/ ferric citrate ac/ MVI/ pantoprozole 40 bid  - mirtazapine 7.5mg  hs  Dialysis Orders:  MWF at [facility] 4hr, 450/800, EDW 111.5kg, 2K/2.25Ca, #4/linear Na, AVF, Hep 4500 + 1500 - Hectoral 44mcg IV q HD - Parsabiv 12.5mg  IV q HD - Mircera 48mcg q 2 weeks  Assessment/Plan: 1.  R Pulmonary Embolii: Unable to be anticoagulated d/t recent renal hematoma. For IVC filter soon. LE dopplers  pending. 2.  ESRD: Usually MWF schedule, for HD today. No heparin. 3.  Hypertension/volume: BP decent, +/- edema on chest CT. 3L UF as tolerated. 4.  Anemia: Hgb 9.4. Last given Aranesp on 8/26; not due yet. 5.  Metabolic bone disease: Ca and Phos high - d/c CaAcetate and ^ Auryxia. Will also hold off on Hectoral for now. 6.  Nutrition: Alb low, adding pro-stat supplements. 7.  L renal hemorrhage/hematoma: Stable on Abd CT. 8.  Hx a-flutter: On amiodarone. No anticoagulation for now. 9.  NICM (EF 30-35%)  Veneta Penton, PA-C 01/27/2018, 10:17 AM  [facility] Pager: [phone]  Pt seen, examined and agree w A/P as above.  Kelly Splinter MD [employer] pager [phone]   01/27/2018, 12:08 PM

## 2018-01-28 DIAGNOSIS — I2699 Other pulmonary embolism without acute cor pulmonale: Principal | ICD-10-CM

## 2018-01-28 DIAGNOSIS — J9601 Acute respiratory failure with hypoxia: Secondary | ICD-10-CM

## 2018-01-28 DIAGNOSIS — J810 Acute pulmonary edema: Secondary | ICD-10-CM

## 2018-01-28 DIAGNOSIS — R0902 Hypoxemia: Secondary | ICD-10-CM

## 2018-01-28 LAB — COMPREHENSIVE METABOLIC PANEL
ALT: 32 U/L (ref 0–44)
AST: 33 U/L (ref 15–41)
Albumin: 2.5 g/dL — ABNORMAL LOW (ref 3.5–5.0)
Alkaline Phosphatase: 90 U/L (ref 38–126)
Anion gap: 14 (ref 5–15)
BUN: 30 mg/dL — ABNORMAL HIGH (ref 6–20)
CO2: 27 mmol/L (ref 22–32)
Calcium: 9.5 mg/dL (ref 8.9–10.3)
Chloride: 92 mmol/L — ABNORMAL LOW (ref 98–111)
Creatinine, Ser: 7.59 mg/dL — ABNORMAL HIGH (ref 0.61–1.24)
GFR calc Af Amer: 8 mL/min — ABNORMAL LOW (ref >60)
GFR calc non Af Amer: 7 mL/min — ABNORMAL LOW (ref >60)
Glucose, Bld: 93 mg/dL (ref 70–99)
Potassium: 5 mmol/L (ref 3.5–5.1)
Sodium: 133 mmol/L — ABNORMAL LOW (ref 135–145)
Total Bilirubin: 1.1 mg/dL (ref 0.3–1.2)
Total Protein: 7.4 g/dL (ref 6.5–8.1)

## 2018-01-28 LAB — CBC
HCT: 26.8 % — ABNORMAL LOW (ref 39.0–52.0)
Hemoglobin: 8.3 g/dL — ABNORMAL LOW (ref 13.0–17.0)
MCH: 30.2 pg (ref 26.0–34.0)
MCHC: 31 g/dL (ref 30.0–36.0)
MCV: 97.5 fL (ref 78.0–100.0)
Platelets: 263 10*3/uL (ref 150–400)
RBC: 2.75 MIL/uL — ABNORMAL LOW (ref 4.22–5.81)
RDW: 15.8 % — ABNORMAL HIGH (ref 11.5–15.5)
WBC: 16.2 10*3/uL — ABNORMAL HIGH (ref 4.0–10.5)

## 2018-01-28 NOTE — Progress Notes (Addendum)
HD tx ended 1 hr early d/t several factors including the pt had already almost clotted off 1 system and was about to clot off second system, ended tx to keep from losing blood and pt had received a total of 1500 ml of fluid b/w prime/rinseback/flushes trying to keep system from clotting, pt had also had to be placed back on bipap d/t labored breathing and the RT had to come back 3 times w/in a few mins at pt's request as he kept stating he was still having trouble breathing. He then started to c/o cp but pleuritic pain rather than cardiac pain. RT called the RR nurse to the bedside, pt's HR slightly increased but his respiratory effort increased as well and RR increased rather than decreased on the bipap. tx stopped, UF goal not met, blood rinsed back, since tx stopped w/ 1 hr left I was not able to give his abx and that was reported to prim RN, report called to Roderic Ovens, RN. Dr Florene Glen was called and made aware of situation

## 2018-01-28 NOTE — Progress Notes (Signed)
[facility] Progress Note  Subjective: had 3 of  4 hr HD last night, taken off for severe pleuritic CP's and increased resp effort.  This am still severe R sided CP w/ splinting.  No prod cough or hemoptysis.  SpO2 is ok on nasal O2.  CXR was negative for edema.   Vitals:   01/27/18 2352 01/28/18 0100 01/28/18 0350 01/28/18 0757  BP: 112/70  117/63 118/70  Pulse: (!) 113  99 91  Resp: (!) 38  (!) 25 18  Temp: (!) 102.2 F (39 C) 98.3 F (36.8 C) 98.5 F (36.9 C) 98.5 F (36.9 C)  TempSrc: Oral Oral Oral Oral  SpO2: 96%  100% 100%  Weight:   113.2 kg   Height:        Inpatient medications: . allopurinol  150 mg Oral QODAY  . amiodarone  100 mg Oral Daily  . chlorhexidine  15 mL Mouth Rinse BID  . Chlorhexidine Gluconate Cloth  6 each Topical Q0600  . feeding supplement (PRO-STAT SUGAR FREE 64)  30 mL Oral BID  . ferric citrate  630 mg Oral TID WC  . mouth rinse  15 mL Mouth Rinse q12n4p  . midodrine  5 mg Oral TID WC  . mirtazapine  7.5 mg Oral QHS  . multivitamin  1 tablet Oral QHS  . pantoprazole  40 mg Oral BID   . sodium chloride    . sodium chloride    . ceFEPime (MAXIPIME) IV 1 g (01/27/18 2308)  . vancomycin 1,000 mg (01/27/18 2357)   sodium chloride, sodium chloride, acetaminophen, albuterol, alteplase, dextromethorphan-guaiFENesin, heparin, hydrALAZINE, lidocaine (PF), lidocaine-prilocaine, morphine injection, nitroGLYCERIN, ondansetron (ZOFRAN) IV, oxyCODONE-acetaminophen, pentafluoroprop-tetrafluoroeth, zolpidem  Iron/TIBC/Ferritin/ %Sat    Component Value Date/Time   IRON 28 (L) 01/18/2018 1238   TIBC 158 (L) 01/18/2018 1238   FERRITIN 422 (H) 05/22/2011 0600   IRONPCTSAT 18 01/18/2018 1238   Home meds:  - amiodarone 100 qd/ midodrine 10 preHD tiw+ 5mg  qd prn/ sl NTG prn  - allopurinol 150 qd/ calc acetate ac/ ferric citrate ac/ MVI/ pantoprozole 40 bid  - mirtazapine 7.5mg  hs   Exam: General: Well developed, well nourished.  Increased respiratory effort  Neck: JVD not elevated. Lungs: Dull in B bases, pain with inspiratory so unable to get great exam. Heart: RRR with normal S1, S2. Abdomen: Soft, mild tenderness with palpation. Lower extremities: No LE edema or ischemic changes Neuro: Alert and oriented X 3 Dialysis Access: AVF + thrill  Dialysis Orders:  MWF at [facility]  4h  111.5kg  2/2.25 bath  P4  AVF  Hep 4500+ 1560mid - Hectoral 73mcg IV q HD - Parsabiv 12.5mg  IV q HD - Mircera 62mcg q 2 weeks  Assessment/Plan: 1.  R Pulmonary Embolii: Unable to be anticoagulated d/t recent renal hematoma. IVC filter done 9/6 yesterday in IR.  LE dopplers +DVT. Still sig pleuritic CP's. Per primary team.  2.  ESRD: MWF HD.  Had HD last night. No vol overload, CXR clear. No further HD this weekend anticipated, plan HD Monday on schedule.  3.  Hypertension/volume: stable 4.  Anemia: Hgb 9.4. Last given Aranesp on 8/26; not due yet. 5.  Metabolic bone disease: Ca and Phos high - d/c CaAcetate and ^ Auryxia. Will also hold off on Hectoral for now. 6.  Nutrition: Alb low, adding pro-stat supplements. 7.  L renal hemorrhage/hematoma: Stable on Abd CT. 8.  Hx a-flutter: On amiodarone. No anticoagulation for now 9. NICM (EF 30-35%)  Kelly Splinter MD [employer] pager [phone]   01/28/2018, 9:01 AM   Recent Labs  Lab 01/23/18 0629  01/27/18 0222 01/27/18 1856 01/28/18 0256  NA 132*   < >  --  132* 133*  K 5.1   < >  --  5.3* 5.0  CL 96*   < >  --  92* 92*  CO2 22   < >  --  25 27  GLUCOSE 86   < >  --  81 93  BUN 67*   < >  --  45* 30*  CREATININE 12.60*   < >  --  9.10* 7.59*  CALCIUM 10.1   < >  --  9.5 9.5  PHOS 5.6*  --   --  5.9*  --   ALBUMIN 2.5*  --   --  2.6* 2.5*  INR  --   --  1.22  --   --    < > = values in this interval not displayed.   Recent Labs  Lab 01/28/18 0256  AST 33  ALT 32  ALKPHOS 90  BILITOT 1.1  PROT 7.4   Recent Labs  Lab 01/21/18 0936  01/27/18 1856  01/28/18 0256  WBC 9.8   < > 12.1* 16.2*  NEUTROABS 7.2  --   --   --   HGB 8.5*   < > 8.8* 8.3*  HCT 26.9*   < > 27.8* 26.8*  MCV 98.9   < > 97.9 97.5  PLT 248   < > 305 263   < > = values in this interval not displayed.

## 2018-01-28 NOTE — Progress Notes (Signed)
HD tx initiated via 15Gx2 w/o problem by [place], RN, pull/push/flush well w/o problem, VSS w/ increased bp, will cont to monitor while on HD tx

## 2018-01-28 NOTE — Progress Notes (Signed)
[facility]  PROGRESS NOTE  [name] KDT:[id number] DOB: 1960/10/07 DOA: 01/26/2018 PCP: Iona Beard, MD   Brief HPI:   57 year old male with a history of ESRD on hemodialysis Monday Wednesday Friday, hypertension, CVA, GERD, gout, atrial fibrillation not on anticoagulation, systolic CHF with EF 99%, CAD, status post left nephrectomy for spontaneous hemorrhage in the left kidney in the setting of supratherapeutic INR.  Came to hospital with worsening shortness of breath. CTA showed possibility of pulmonary embolism, pneumonia and also not excluded.  On IV antibiotics.  Chest x-ray also showed mild interstitial edema.    Subjective   Patient seen and examined, continues to be tachypneic.  Underwent hemodialysis for 4 hours last night.   Assessment/Plan:     1. Acute respiratory failure with hypoxia-likely due to pulmonary embolism, cannot use anticoagulation due to recent renal hemorrhage.  Admitting physician discussed with PCCM last night who recommended IVC filter placement.  Patient also started on vancomycin and cefepime for pneumonia, procalcitonin elevated to 4.02, lactic acid 2.75.  Will call pulmonary consultation for further recommendations as patient continues to be tachypneic  2. Pulmonary embolism-seen on CTA chest, IVC filter will be placed as patient is a high risk for bleed due to recent left renal hemorrhage.  Will not start anticoagulation at this time.  This was discussed by admitting physician with PCCM Dr. Lucile Shutters.  Will discuss with urology regarding timing of restarting anticoagulation  3. Healthcare associated pneumonia-concern for pneumonia infectious process.  Significant elevation of procalcitonin 4.02, lactic acid 2.75.  Started on vancomycin and cefepime.  Will continue with antibiotics follow blood culture results.  4. Chronic combined systolic and diastolic CHF-chest x-ray showed mild edema, 2D echo from October 2018 showed EF 30 to 35% with grade 1  diastolic dysfunction.  I have consulted nephrology for dialysis today.  5. ESRD on hemodialysis-patient gets similar to Friday, consulted nephrology for dialysis today.  Continue midodrine for hypotension  6. Recent history of hemorrhage of left kidney-CT scan of the abdomen showed unchanged appearance of moderate to large left renal and perinephric hematoma/hemorrhage descending into the pelvis.  Hemoglobin is stable at 8.8  7. CAD-stable  8. Atrial fibrillation- CHA2DS2VASc score is 5, patient needs oral anticoagulation but due to recent renal hemorrhage will not be able to start anticoagulation.  Heart rate controlled.  Continue amiodarone.  Telemetry monitoring.  9. Anemia-continue iron supplementation  10. Depression-continue mirtazapine  11. GERD-continue Protonix twice daily     CBG: Recent Labs  Lab 01/23/18 0017 01/23/18 0414 01/23/18 0757 01/23/18 1217 01/23/18 1555  GLUCAP 108* 72 79 95 109*    CBC: Recent Labs  Lab 01/23/18 0629 01/23/18 1301 01/26/18 1812 01/27/18 1856 01/28/18 0256  WBC  --  9.4 11.7* 12.1* 16.2*  HGB 8.8* 8.6* 9.4* 8.8* 8.3*  HCT 26.7* 26.2* 29.6* 27.8* 26.8*  MCV  --  94.9 97.4 97.9 97.5  PLT  --  275 311 305 833    Basic Metabolic Panel: Recent Labs  Lab 01/22/18 0853 01/23/18 0629 01/26/18 1812 01/27/18 1856 01/28/18 0256  NA 132* 132* 135 132* 133*  K 4.3 5.1 4.6 5.3* 5.0  CL 96* 96* 92* 92* 92*  CO2 23 22 27 25 27   GLUCOSE 103* 86 94 81 93  BUN 53* 67* 36* 45* 30*  CREATININE 10.56* 12.60* 8.91* 9.10* 7.59*  CALCIUM 10.4* 10.1 10.4* 9.5 9.5  PHOS 5.0* 5.6*  --  5.9*  --      DVT prophylaxis: No  DVT prophylaxis as venous duplex of lower extremities pending to rule out DVT, cannot use heparin or Lovenox due to recent renal hemorrhage  Code Status: Full code  Family Communication: No family at bedside  Disposition Plan: likely home when medically ready for  discharge   Consultants:  None  Procedures:  None   Antibiotics:   Anti-infectives (From admission, onward)   Start     Dose/Rate Route Frequency Ordered Stop   01/27/18 2000  ceFEPIme (MAXIPIME) 1 g in sodium chloride 0.9 % 100 mL IVPB     1 g 200 mL/hr over 30 Minutes Intravenous Every 24 hours 01/26/18 1942     01/27/18 2000  vancomycin (VANCOCIN) IVPB 1000 mg/200 mL premix     1,000 mg 200 mL/hr over 60 Minutes Intravenous Every M-W-F (Hemodialysis) 01/27/18 1707     01/27/18 1200  vancomycin (VANCOCIN) IVPB 1000 mg/200 mL premix  Status:  Discontinued     1,000 mg 200 mL/hr over 60 Minutes Intravenous Every M-W-F (Hemodialysis) 01/26/18 1942 01/27/18 1707   01/26/18 2000  vancomycin (VANCOCIN) 2,000 mg in sodium chloride 0.9 % 500 mL IVPB     2,000 mg 250 mL/hr over 120 Minutes Intravenous  Once 01/26/18 1931 01/26/18 2121   01/26/18 1930  vancomycin (VANCOCIN) IVPB 1000 mg/200 mL premix  Status:  Discontinued     1,000 mg 200 mL/hr over 60 Minutes Intravenous  Once 01/26/18 1923 01/26/18 1931   01/26/18 1930  ceFEPIme (MAXIPIME) 2 g in sodium chloride 0.9 % 100 mL IVPB     2 g 200 mL/hr over 30 Minutes Intravenous  Once 01/26/18 1923 01/26/18 2122       Objective   Vitals:   01/28/18 0350 01/28/18 0757 01/28/18 0800 01/28/18 1300  BP: 117/63 118/70 118/70 (!) 141/67  Pulse: 99 91  94  Resp: (!) 25 18 18  (!) 34  Temp: 98.5 F (36.9 C) 98.5 F (36.9 C)  98.1 F (36.7 C)  TempSrc: Oral Oral  Oral  SpO2: 100% 100% 100% 98%  Weight: 113.2 kg     Height:        Intake/Output Summary (Last 24 hours) at 01/28/2018 1439 Last data filed at 01/28/2018 1300 Gross per 24 hour  Intake 300 ml  Output 1633 ml  Net -1333 ml   Filed Weights   01/26/18 1758 01/26/18 2310 01/28/18 0350  Weight: 113.4 kg 114.3 kg 113.2 kg     Physical Examination:    General: Appears tachypneic, lethargic Cardiovascular: S1-S2, regular Respiratory: Decreased breath sounds  bilaterally Abdomen: Soft, nontender, no organomegaly Musculoskeletal: No edema of the lower extremities      Data Reviewed: I have personally reviewed following labs and imaging studies   Recent Results (from the past 240 hour(s))  Blood Culture (routine x 2)     Status: None (Preliminary result)   Collection Time: 01/26/18  8:45 PM  Result Value Ref Range Status   Specimen Description BLOOD RIGHT ANTECUBITAL  Final   Special Requests   Final    BOTTLES DRAWN AEROBIC AND ANAEROBIC Blood Culture adequate volume   Culture   Final    NO GROWTH 2 DAYS Performed at [facility], [place] [address], [place], [place] [address]    Report Status PENDING  Incomplete  Blood Culture (routine x 2)     Status: None (Preliminary result)   Collection Time: 01/26/18  8:45 PM  Result Value Ref Range Status   Specimen Description BLOOD RIGHT HAND  Final   Special Requests   Final    BOTTLES DRAWN AEROBIC ONLY Blood Culture results may not be optimal due to an inadequate volume of blood received in culture bottles   Culture   Final    NO GROWTH 2 DAYS Performed at [facility], [place] [address] [place], [place] [address]    Report Status PENDING  Incomplete     Liver Function Tests: Recent Labs  Lab 01/22/18 0853 01/23/18 0629 01/27/18 1856 01/28/18 0256  AST  --   --   --  33  ALT  --   --   --  32  ALKPHOS  --   --   --  90  BILITOT  --   --   --  1.1  PROT  --   --   --  7.4  ALBUMIN 2.7* 2.5* 2.6* 2.5*   No results for input(s): LIPASE, AMYLASE in the last 168 hours. No results for input(s): AMMONIA in the last 168 hours.  Cardiac Enzymes: No results for input(s): CKTOTAL, CKMB, CKMBINDEX, TROPONINI in the last 168 hours. BNP (last 3 results) Recent Labs    07/18/17 0531  BNP 1,325.2*    ProBNP (last 3 results) No results for input(s): PROBNP in the last 8760 hours.    Studies: Dg Chest 2 View  Result Date: 01/26/2018 CLINICAL DATA:  Shortness of breath  for 1 day EXAM: CHEST - 2 VIEW COMPARISON:  January 14, 2018 FINDINGS: The heart size is enlarged. Mediastinal contour is normal. There is patchy consolidation of left lung base. There is mild deep diffuse increased pulmonary interstitium bilaterally. There is no pulmonary edema. IMPRESSION: Patchy consolidation of left lung base suspicious for pneumonia. Mild interstitial edema. Electronically Signed   By: Abelardo Diesel M.D.   On: 01/26/2018 18:38   Ct Angio Chest Pe W/cm &/or Wo Cm  Result Date: 01/26/2018 CLINICAL DATA:  57 year old male with acute chest pain and shortness of breath. History of recent LEFT renal hemorrhage. EXAM: CT ANGIOGRAPHY CHEST WITH CONTRAST TECHNIQUE: Multidetector CT imaging of the chest was performed using the standard protocol during bolus administration of intravenous contrast. Multiplanar CT image reconstructions and MIPs were obtained to evaluate the vascular anatomy. CONTRAST:  144mL ISOVUE-370 IOPAMIDOL (ISOVUE-370) INJECTION 76% COMPARISON:  03/15/2016 chest CT FINDINGS: Cardiovascular: This is a technically suboptimal study due to extensive respiratory motion artifact and less than optimal contrast opacification of the pulmonary arteries. The study was to be repeated but patient had been sent to his hospital room where he became unstable. Probable filling defects/pulmonary emboli within 2 RIGHT UPPER lobe segments. The LOWER lobe pulmonary arteries are not well evaluated due to extensive motion artifact. Cardiomegaly noted without CT evidence of RIGHT heart strain. Coronary artery and aortic atherosclerotic calcifications identified without aortic aneurysm. No pericardial effusion. Mediastinum/Nodes: No enlarged mediastinal, hilar, or axillary lymph nodes. Thyroid gland, trachea, and esophagus demonstrate no significant findings. Lungs/Pleura: Mild to moderate bibasilar atelectasis noted. No pleural effusion or pneumothorax. Upper Abdomen: LEFT renal/perinephric hemorrhage  again noted. Musculoskeletal: No acute or suspicious bony abnormalities. Review of the MIP images confirms the above findings. IMPRESSION: 1. Suboptimal study but probable RIGHT UPPER lobe pulmonary emboli. LOWER lung pulmonary arteries are not well evaluated due to motion. Bibasilar atelectasis 2. Cardiomegaly without CT evidence of RIGHT heart strain 3. Coronary artery disease and Aortic Atherosclerosis (ICD10-I70.0). Critical Value/emergent results were called by telephone at the time of interpretation on 01/26/2018 at 10:57 pm to [place],  RN for this patient, who verbally acknowledged these results. Electronically Signed   By: Margarette Canada M.D.   On: 01/26/2018 22:59   Ct Abdomen Pelvis W Contrast  Result Date: 01/26/2018 CLINICAL DATA:  57 year old male with continued RIGHT flank and abdominal/pelvic pain. History of recent embolization of active LEFT renal hemorrhage. CTA chest will be dictated in a separate report as repeat imaging will be performed. EXAM: CT ABDOMEN AND PELVIS WITH CONTRAST TECHNIQUE: Multidetector CT imaging of the abdomen and pelvis was performed using the standard protocol following bolus administration of intravenous contrast. CONTRAST:  162mL ISOVUE-370 IOPAMIDOL (ISOVUE-370) INJECTION 76% COMPARISON:  01/13/2018 CT FINDINGS: Lower chest: Bibasilar atelectasis noted. Hepatobiliary: The liver is unremarkable except for a small LEFT hepatic cyst. The patient is status post cholecystectomy. No biliary dilatation. Pancreas: Unremarkable Spleen: Unremarkable Adrenals/Urinary Tract: Moderate to large LEFT renal and perinephric hematoma/hemorrhage extending into the pelvis is not significantly changed in appearance. Embolization coils in the region of the LEFT renal artery noted. The patient is status post RIGHT nephrectomy. The adrenal glands are unremarkable.  The bladder is collapsed. Stomach/Bowel: Stomach is within normal limits. Appendix appears normal. No evidence of bowel wall thickening,  distention, or inflammatory changes. Vascular/Lymphatic: Aortic atherosclerosis. No enlarged abdominal or pelvic lymph nodes. Reproductive: Prostate is unremarkable. Other: No evidence of abscess, pneumoperitoneum or ascites. Musculoskeletal: No acute or suspicious bony abnormalities. IMPRESSION: 1. Unchanged appearance of moderate to large LEFT renal and perinephric hematoma/hemorrhage descending into the pelvis. 2. Bibasilar atelectasis 3.  Aortic Atherosclerosis (ICD10-I70.0). CTA of the chest will be dictated in a separate report. Electronically Signed   By: Margarette Canada M.D.   On: 01/26/2018 22:06   Ir Ivc Filter Plmt / S&i /img Guid/mod Sed  Result Date: 01/27/2018 INDICATION: Pulmonary thromboembolism.  Hemorrhagic shock. EXAM: IVC FILTER,INFERIOR VENA CAVOGRAM MEDICATIONS: None. ANESTHESIA/SEDATION: Fentanyl 50 mcg IV; Versed 1 mg IV Moderate Sedation Time:  16 minutes The patient was continuously monitored during the procedure by the interventional radiology nurse under my direct supervision. FLUOROSCOPY TIME:  Fluoroscopy Time: 1 minutes  seconds (131 mGy). COMPLICATIONS: None immediate. PROCEDURE: Informed written consent was obtained from the patient after a thorough discussion of the procedural risks, benefits and alternatives. All questions were addressed. Maximal Sterile Barrier Technique was utilized including caps, mask, sterile gowns, sterile gloves, sterile drape, hand hygiene and skin antiseptic. A timeout was performed prior to the initiation of the procedure. The right neck was prepped with Betadine in a sterile fashion, and a sterile drape was applied covering the operative field. A sterile gown and sterile gloves were used for the procedure. The right jugular vein was noted to be patent initially with ultrasound. Under sonographic guidance, a micropuncture needle was inserted into the right jugular vein (Ultrasound image documentation was performed). It was removed over an 018 wire which  was up-sized to a [place]. The sheath was inserted over the wire and into the IVC. IVC venography was performed. The temporary filter was then deployed in the infrarenal IVC. The sheath was removed and hemostasis was achieved with direct pressure. FINDINGS: Venography demonstrates approximate renal vein inflow at L1. The patient is status post right nephrectomy and left renal embolization. There is no evidence of DVT or venous anomaly. The final image demonstrates an IVC filter with its tip positioned at the L1-2 disc level. IMPRESSION: Successful infrarenal IVC filter placement. This is a temporary filter. It can be removed or remain in place to become permanent. PLAN: This IVC filter  is potentially retrievable. The patient will be assessed for filter retrieval by Interventional Radiology in approximately 8-12 weeks. Further recommendations regarding filter retrieval, continued surveillance or declaration of device permanence, will be made at that time. Electronically Signed   By: Marybelle Killings M.D.   On: 01/27/2018 16:31   Dg Chest Port 1 View  Result Date: 01/27/2018 CLINICAL DATA:  Short of breath EXAM: PORTABLE CHEST 1 VIEW COMPARISON:  CT chest 01/26/2018, radiograph 01/26/2018, 01/14/2018 FINDINGS: Marked cardiomegaly. Elevated left diaphragm with patchy airspace disease at the left base. No pneumothorax. IMPRESSION: 1. Marked cardiomegaly. 2. Elevated left diaphragm with patchy atelectasis or infiltrate at the left base. Electronically Signed   By: Donavan Foil M.D.   On: 01/27/2018 22:01    Scheduled Meds: . allopurinol  150 mg Oral QODAY  . amiodarone  100 mg Oral Daily  . chlorhexidine  15 mL Mouth Rinse BID  . Chlorhexidine Gluconate Cloth  6 each Topical Q0600  . feeding supplement (PRO-STAT SUGAR FREE 64)  30 mL Oral BID  . ferric citrate  630 mg Oral TID WC  . mouth rinse  15 mL Mouth Rinse q12n4p  . midodrine  5 mg Oral TID WC  . mirtazapine  7.5 mg Oral QHS  . multivitamin  1 tablet  Oral QHS  . pantoprazole  40 mg Oral BID      Time spent: 25 min  [facility] Pager [phone]. If 7PM-7AM, please contact night-coverage at [email or web address], Office  [phone]  password [email or web address]  01/28/2018, 2:39 PM  LOS: 2 days

## 2018-01-28 NOTE — Consult Note (Signed)
[name]  MOQ:[id number] DOB: 08/13/1960 DOA: 01/26/2018 PCP: Iona Beard, MD    LOS: 2 days   Reason for Consult / Chief Complaint:  Tachypnea and pleuritic chest pain in setting of pulmonary emboli  Consulting MD and date:  [place]  HPI/Summary of hospital stay:  57 year old patient with a history of end-stage renal disease likely dialyzed on Monday Wednesday and Friday, hypertension, prior stroke, atrial fibrillation not on anti-coagulation and also systolic cardiomyopathy with ejection fraction 30%.  Recently discharged on 9/2 after hospitalization for spontaneous hemorrhage involving the left kidney in the setting of supratherapeutic INR with resultant hemorrhagic shock.  He underwent embolization successful currently by interventional radiologist on 8/23.  Course complicated by possible pneumonia which was treated by cefepime prior to discharge.  Presents to the emergency room on 9/5 with chief complaint of progressive shortness of breath, pleuritic type chest discomfort, and sharp right flank pain rating 10 out of 10.  He was placed on noninvasive positive pressure ventilation, initially found to be hypotensive therefore was treated with volume bolus. CT abdomen was obtained which showed moderate to large left renal and peri-nephritic hematoma which was unchanged.  CT chest was obtained that showed probable right upper lobe pulmonary emboli, further aging found left lower extremity DVT.  He was admitted to the hospital, therapeutic interventions included placement of IVC filter, supplemental oxygen, and supplemental BiPAP. 9/6: Rapid response called.  Patient with increased work of breathing, respiratory rate in the 50s, saturations 91% on BiPAP.  Chest x-ray obtained showing left lower lobe atelectasis.  Ended up having a completion of 3-hour dialysis.  0.6 L removed.  Oxygen weaned to 4 L from BiPAP.   8/7: Pulmonary asked to see to explain dyspnea and pleuritic chest  discomfort  Subjective:  Feels better, still has pleuritic chest discomfort  Objective   Blood pressure 118/70, pulse 91, temperature 98.5 F (36.9 C), temperature source Oral, resp. rate 18, height 6\' 1"  (1.854 m), weight 113.2 kg, SpO2 100 %.    FiO2 (%):  [30 %] 30 %   Intake/Output Summary (Last 24 hours) at 01/28/2018 1248 Last data filed at 01/27/2018 2130 Gross per 24 hour  Intake 0 ml  Output 1633 ml  Net -1633 ml   Filed Weights   01/26/18 1758 01/26/18 2310 01/28/18 0350  Weight: 113.4 kg 114.3 kg 113.2 kg    Examination: General: Chronically ill appearing male, NAD HENT: [place]/AT, PERRL, EOM-I and MMM Lungs: Bibasilar crackles Cardiovascular: RRR, Nl S1/S2 and -M/R/G Abdomen: Soft, NT, ND and +BS Extremities: -edema and -tenderness Neuro: Alert and interactive, moving all ext to command  Consults: date of consult/date signed off & final recs:  Nephrology consulted 9/5 for dialysis Pulmonary consulted 9/7 for tachypnea Interventional radiology consulted 9/6: For IVC filter placement.  Procedures: IVC filter placed 9/6  Significant Diagnostic Tests: CT chest and abdomen obtained on 9/5:Suboptimal study in regards to chest however did show right upper lobe pulmonary emboli.  The lower lung pulmonary arteries were not well evaluated.  There was coronary artery disease and aortic arterial sclerosis.  There was cardiomegaly without evidence of right heart strain.  CT abdomen and demonstrated unchanged appearance of moderate to large left renal and peri-nephritic hematoma/hemorrhage which descends down into the pelvis.  There was bibasilar atelectasis.  Micro Data: Blood culture times two 9/5: Antimicrobials:  Cefepime 9/5 Vancomycin 9/5 Resolved Hospital Problem list    Assessment & Plan:  Assessment and Plan:  57 year old  male with ESRD-HD who had a recent spontaneous hemorrhage in her left kidney who underwent embolization.  Patient presents to PCCM with  probable PE and tachypenea.  On exam, patient appears comfortable now with bibasilar crackles.  I reviewed CXR myself, pulmonary edema noted.  Dicussed with PCCM-NP.  Patient was placed on BiPAP last night for respiratory failure due to fluid overload.  Under went 4 hours of HD overnight and improved.  Now is on 4L [place] saturating 100%.    Acute hypoxemic respiratory failure: due to fluid overload  - HD per renal  - Strict I/O  - Minimize intake  Hypoxemia:  - Titrate O2 for sat of 88-92%  - May need an ambulatory desaturation study for home O2 prior to discharge  PE:  - IVC filter given renal hemorrhage  - Supplemental O2  - No anticoagulation until cleared by primary  Anticoagulation therapy for a-fib  - Will defer to primary.  Pleuritic CP due to PE:  - NSAID is ok with renal and primary for pain   Disposition / Summary of Today's Plan 01/28/18     Best Practice / Goals of Care / Disposition.   DVT prophylaxis: Not applicable GI prophylaxis: PPI Diet: Regular/renal Mobility: Advance Code Status: Full code Family Communication: Wife updated bedside  Labs   CBC: Recent Labs  Lab 01/23/18 0629 01/23/18 1301 01/26/18 1812 01/27/18 1856 01/28/18 0256  WBC  --  9.4 11.7* 12.1* 16.2*  HGB 8.8* 8.6* 9.4* 8.8* 8.3*  HCT 26.7* 26.2* 29.6* 27.8* 26.8*  MCV  --  94.9 97.4 97.9 97.5  PLT  --  275 311 305 416   Basic Metabolic Panel: Recent Labs  Lab 01/22/18 0853 01/23/18 0629 01/26/18 1812 01/27/18 1856 01/28/18 0256  NA 132* 132* 135 132* 133*  K 4.3 5.1 4.6 5.3* 5.0  CL 96* 96* 92* 92* 92*  CO2 23 22 27 25 27   GLUCOSE 103* 86 94 81 93  BUN 53* 67* 36* 45* 30*  CREATININE 10.56* 12.60* 8.91* 9.10* 7.59*  CALCIUM 10.4* 10.1 10.4* 9.5 9.5  PHOS 5.0* 5.6*  --  5.9*  --    GFR: Estimated Creatinine Clearance: 14.7 mL/min (A) (by C-G formula based on SCr of 7.59 mg/dL (H)). Recent Labs  Lab 01/23/18 1301 01/26/18 1812 01/26/18 2026 01/27/18 0222  01/27/18 0833 01/27/18 1856 01/28/18 0256  PROCALCITON  --   --   --  4.02  --   --   --   WBC 9.4 11.7*  --   --   --  12.1* 16.2*  LATICACIDVEN  --   --  2.75* 1.3 0.9  --   --    Liver Function Tests: Recent Labs  Lab 01/22/18 0853 01/23/18 0629 01/27/18 1856 01/28/18 0256  AST  --   --   --  33  ALT  --   --   --  32  ALKPHOS  --   --   --  90  BILITOT  --   --   --  1.1  PROT  --   --   --  7.4  ALBUMIN 2.7* 2.5* 2.6* 2.5*   No results for input(s): LIPASE, AMYLASE in the last 168 hours. No results for input(s): AMMONIA in the last 168 hours. ABG    Component Value Date/Time   PHART 7.455 (H) 01/27/2018 2130   PCO2ART 40.3 01/27/2018 2130   PO2ART 72.0 (L) 01/27/2018 2130   HCO3 27.9 01/27/2018 2130  TCO2 26 01/13/2018 0247   ACIDBASEDEF 5.0 (H) 05/21/2011 0805   O2SAT 95.1 01/27/2018 2130    Coagulation Profile: Recent Labs  Lab 01/27/18 0222  INR 1.22   Cardiac Enzymes: No results for input(s): CKTOTAL, CKMB, CKMBINDEX, TROPONINI in the last 168 hours. HbA1C: Hgb A1c MFr Bld  Date/Time Value Ref Range Status  01/13/2015 09:41 PM 5.4 4.8 - 5.6 % Final    Comment:    (NOTE)         Pre-diabetes: 5.7 - 6.4         Diabetes: >6.4         Glycemic control for adults with diabetes: <7.0   10/06/2012 04:10 AM 5.2 <5.7 % Final    Comment:    (NOTE)                                                                       According to the ADA Clinical Practice Recommendations for 2011, when HbA1c is used as a screening test:  >=6.5%   Diagnostic of Diabetes Mellitus           (if abnormal result is confirmed) 5.7-6.4%   Increased risk of developing Diabetes Mellitus References:Diagnosis and Classification of Diabetes Mellitus,Diabetes LNLG,9211,94(RDEYC 1):S62-S69 and Standards of Medical Care in         Diabetes - 2011,Diabetes XKGY,1856,31 (Suppl 1):S11-S61.   CBG: Recent Labs  Lab 01/23/18 0017 01/23/18 0414 01/23/18 0757 01/23/18 1217  01/23/18 1555  GLUCAP 108* 72 79 95 109*     Review of Systems:    Past medical history  He,  has a past medical history of Atrial flutter ([place]), Blind right eye, Cardiomyopathy, CHF (congestive heart failure) ([place]), Coronary artery disease, Diabetes mellitus, Dialysis patient [facility]), Diverticulitis (November 2016), ESRD (end stage renal disease) ([place]), GERD (gastroesophageal reflux disease), Gout, History of nephrectomy (07/04/2012), History of unilateral nephrectomy, Hypertension, Low iron, Myocardial infarction ([place]) (?2006), Renal cell carcinoma, Renal insufficiency, Shortness of breath (05/19/11), Stroke ([place]) (49/7026), and Umbilical hernia (37/85/88).   Surgical History    Past Surgical History:  Procedure Laterality Date  . AV FISTULA PLACEMENT  03/29/2011   Procedure: ARTERIOVENOUS (AV) FISTULA CREATION;  Surgeon: Hinda Lenis, MD;  Location: [place];  Service: Vascular;  Laterality: Left;  LEFT RADIOCEPHALIC , Arteriovenous [id number]([id number])  . CARDIAC CATHETERIZATION  05/21/11  . CARDIAC CATHETERIZATION N/A 03/16/2016   Procedure: Left Heart Cath and Coronary Angiography;  Surgeon: Dixie Dials, MD;  Location: [place] CV LAB;  Service: Cardiovascular;  Laterality: N/A;  . CHOLECYSTECTOMY N/A 02/12/2016   Procedure: LAPAROSCOPIC CHOLECYSTECTOMY WITH INTRAOPERATIVE CHOLANGIOGRAM;  Surgeon: Jackolyn Confer, MD;  Location: [place];  Service: General;  Laterality: N/A;  . COLONOSCOPY WITH PROPOFOL N/A 02/01/2017   Procedure: COLONOSCOPY WITH PROPOFOL;  Surgeon: Juanita Craver, MD;  Location: [facility];  Service: Endoscopy;  Laterality: N/A;  . dialysis cath placed    . ESOPHAGOGASTRODUODENOSCOPY (EGD) WITH PROPOFOL N/A 12/02/2015   Procedure: ESOPHAGOGASTRODUODENOSCOPY (EGD) WITH PROPOFOL;  Surgeon: Juanita Craver, MD;  Location: [facility];  Service: Endoscopy;  Laterality: N/A;  . FINGER SURGERY     L pinkie finger- ORIF- /w remaining hardware - 1990's    . HERNIA REPAIR N/A  7/67/2094   Umbilical  hernia repair  . INSERTION OF DIALYSIS CATHETER N/A 01/27/2016   Procedure: INSERTION OF DIALYSIS CATHETER;  Surgeon: Waynetta Sandy, MD;  Location: [place];  Service: Vascular;  Laterality: N/A;  . INSERTION OF MESH N/A 07/04/2012   Procedure: INSERTION OF MESH;  Surgeon: Madilyn Hook, DO;  Location: [place];  Service: General;  Laterality: N/A;  . IR EMBO ART  VEN HEMORR LYMPH EXTRAV  INC GUIDE ROADMAPPING  01/13/2018  . IR FLUORO GUIDE CV LINE RIGHT  01/13/2018  . IR IVC FILTER PLMT / S&I /IMG GUID/MOD SED  01/27/2018  . IR RENAL SELECTIVE  UNI INC S&I MOD SED  01/13/2018  . IR US GUIDE VASC ACCESS RIGHT  01/13/2018  . LAPAROSCOPIC LYSIS OF ADHESIONS N/A 02/12/2016   Procedure: LAPAROSCOPIC LYSIS OF ADHESIONS;  Surgeon: Jackolyn Confer, MD;  Location: [place];  Service: General;  Laterality: N/A;  . LEFT AND RIGHT HEART CATHETERIZATION WITH CORONARY ANGIOGRAM N/A 08/04/2012   Procedure: LEFT AND RIGHT HEART CATHETERIZATION WITH CORONARY ANGIOGRAM;  Surgeon: Birdie Riddle, MD;  Location: [place] CATH LAB;  Service: Cardiovascular;  Laterality: N/A;  . NEPHRECTOMY  2000   right  . REVISON OF ARTERIOVENOUS FISTULA Left 06/05/2013   Procedure: REVISON OF LEFT RADIOCEPHALIC  ARTERIOVENOUS FISTULA;  Surgeon: Conrad Spanaway, MD;  Location: [place];  Service: Vascular;  Laterality: Left;  . REVISON OF ARTERIOVENOUS FISTULA Left 01/27/2016   Procedure: REVISION OF LEFT UPPER EXTREMITY ARTERIOVENOUS FISTULA;  Surgeon: Waynetta Sandy, MD;  Location: [place];  Service: Vascular;  Laterality: Left;  . REVISON OF ARTERIOVENOUS FISTULA Left 08/03/2016   Procedure: REVISON OF Left arm ARTERIOVENOUS FISTULA;  Surgeon: Elam Dutch, MD;  Location: [place];  Service: Vascular;  Laterality: Left;  . REVISON OF ARTERIOVENOUS FISTULA Left 05/06/2017   Procedure: REVISION PLICATION OF ARTERIOVENOUS FISTULA LEFT ARM;  Surgeon: Rosetta Posner, MD;  Location: [place];  Service: Vascular;  Laterality:  Left;  . RIGHT HEART CATHETERIZATION N/A 05/21/2011   Procedure: RIGHT HEART CATH;  Surgeon: Birdie Riddle, MD;  Location: [facility] CATH LAB;  Service: Cardiovascular;  Laterality: N/A;  . smashed  1990's   "left pinky; have a plate in there"  . UMBILICAL HERNIA REPAIR N/A 07/04/2012   Procedure: LAPAROSCOPIC UMBILICAL HERNIA;  Surgeon: Madilyn Hook, DO;  Location: [place];  Service: General;  Laterality: N/A;  . US ECHOCARDIOGRAPHY  05/20/11     Social History   Social History   Socioeconomic History  . Marital status: Married    Spouse name: Not on file  . Number of children: Not on file  . Years of education: Not on file  . Highest education level: Not on file  Occupational History  . Not on file  Social Needs  . Financial resource strain: Not on file  . Food insecurity:    Worry: Not on file    Inability: Not on file  . Transportation needs:    Medical: Not on file    Non-medical: Not on file  Tobacco Use  . Smoking status: Never Smoker  . Smokeless tobacco: Never Used  Substance and Sexual Activity  . Alcohol use: No    Alcohol/week: 0.0 standard drinks  . Drug use: No    Types: Cocaine    Comment: Past hx-none in 20 yrs  . Sexual activity: Yes  Lifestyle  . Physical activity:    Days per week: Not on file    Minutes per session: Not on  file  . Stress: Not on file  Relationships  . Social connections:    Talks on phone: Not on file    Gets together: Not on file    Attends religious service: Not on file    Active member of club or organization: Not on file    Attends meetings of clubs or organizations: Not on file    Relationship status: Not on file  . Intimate partner violence:    Fear of current or ex partner: Not on file    Emotionally abused: Not on file    Physically abused: Not on file    Forced sexual activity: Not on file  Other Topics Concern  . Not on file  Social History Narrative  . Not on file  ,  reports that he has never smoked. He has never  used smokeless tobacco. He reports that he does not drink alcohol or use drugs.   Family history   His family history includes Hypertension in his mother; Kidney disease in his mother.   Allergies Allergies  Allergen Reactions  . Ace Inhibitors Itching and Cough    Home meds  Prior to Admission medications   Medication Sig Start Date End Date Taking? Authorizing Provider  allopurinol (ZYLOPRIM) 300 MG tablet Take 150 mg by mouth every other day.   Yes [provider]  amiodarone (PACERONE) 200 MG tablet Take 0.5 tablets (100 mg total) by mouth daily. 07/21/17  Yes Regalado, Belkys A, MD  calcium acetate (PHOSLO) 667 MG capsule Take 667-2,001 mg by mouth See admin instructions. Take 3 capsules with meals and 1 capsule with snacks. 01/24/18  Yes [provider]  ferric citrate (AURYXIA) 1 GM 210 MG(Fe) tablet Take 210 mg by mouth 3 (three) times daily with meals.    Yes [provider]  lidocaine-prilocaine (EMLA) cream Apply 1 application topically See admin instructions. Apply topically to port access prior to dialysis - Monday, Wednesday, Friday 04/08/15  Yes [provider]  midodrine (PROAMATINE) 10 MG tablet Take 0.5-1 tablets (5-10 mg total) by mouth See admin instructions. Take one tablet (10 mg) by mouth before dialysis (Monday, Wednesday, Friday), take 1/2 tablet (5 mg) on non-dialysis days (Sunday, Tuesday, Thursday, Saturday) as needed for diastolic blood pressure <92. 01/23/18  Yes Emokpae, Courage, MD  mirtazapine (REMERON) 7.5 MG tablet Take 1 tablet (7.5 mg total) by mouth at bedtime. 01/23/18  Yes Emokpae, Courage, MD  multivitamin (RENA-VIT) TABS tablet Take 1 tablet by mouth at bedtime.   Yes [provider]  nitroGLYCERIN (NITROSTAT) 0.4 MG SL tablet Place 0.4 mg under the tongue every 5 (five) minutes as needed for chest pain.  05/20/15  Yes [provider]  pantoprazole (PROTONIX) 40 MG tablet Take 40 mg by mouth 2 (two) times  daily.   Yes [provider]    PCCM will sign off, please call back if needed.  Patient seen and examined, agree with above note.  I dictated the care and orders written for this patient under my direction.  Rush Farmer, [place]

## 2018-01-29 LAB — GLUCOSE, CAPILLARY
Glucose-Capillary: 120 mg/dL — ABNORMAL HIGH (ref 70–99)
Glucose-Capillary: 104 mg/dL — ABNORMAL HIGH (ref 70–99)

## 2018-01-29 MED ORDER — ALPRAZOLAM 0.25 MG PO TABS
0.2500 mg | ORAL_TABLET | Freq: Once | ORAL | Status: AC
  Administered 2018-01-29: 0.25 mg via ORAL
  Filled 2018-01-29: qty 1

## 2018-01-29 NOTE — Progress Notes (Signed)
Patient declined NIV at this time, no distress noted RCP will continue to follow.

## 2018-01-29 NOTE — Progress Notes (Addendum)
[facility] Progress Note   Subjective:  Seen in room. Still tachypneic and breathing shallow d/t pleuritic chest/back pain. Today with nausea; has not eaten much since admit. Remains very ill. Did not tolerate HD well on Fri evening, due again tomorrow.    Objective Vitals:   01/29/18 0216 01/29/18 0459 01/29/18 0500 01/29/18 0744  BP:  120/81 120/81 (!) 157/64  Pulse:  99  100  Resp:  (!) 24 18 19   Temp:  98.7 F (37.1 C)  98.6 F (37 C)  TempSrc:  Oral  Oral  SpO2:  100% 98% 100%  Weight: 110.6 kg     Height:       Physical Exam General: Increased respirations, sitting upright in chair. On nasal O2 5L/min. Heart: Tachycardic, no murmur Lungs: Dull in B bases, unable to take deep breath for exam Extremities: No LE edema Dialysis Access: L AVF + thrill  Additional Objective Labs: Basic Metabolic Panel: Recent Labs  Lab 01/23/18 0629 01/26/18 1812 01/27/18 1856 01/28/18 0256  NA 132* 135 132* 133*  K 5.1 4.6 5.3* 5.0  CL 96* 92* 92* 92*  CO2 22 27 25 27   GLUCOSE 86 94 81 93  BUN 67* 36* 45* 30*  CREATININE 12.60* 8.91* 9.10* 7.59*  CALCIUM 10.1 10.4* 9.5 9.5  PHOS 5.6*  --  5.9*  --    Liver Function Tests: Recent Labs  Lab 01/23/18 0629 01/27/18 1856 01/28/18 0256  AST  --   --  33  ALT  --   --  32  ALKPHOS  --   --  90  BILITOT  --   --  1.1  PROT  --   --  7.4  ALBUMIN 2.5* 2.6* 2.5*   CBC: Recent Labs  Lab 01/23/18 1301 01/26/18 1812 01/27/18 1856 01/28/18 0256  WBC 9.4 11.7* 12.1* 16.2*  HGB 8.6* 9.4* 8.8* 8.3*  HCT 26.2* 29.6* 27.8* 26.8*  MCV 94.9 97.4 97.9 97.5  PLT 275 311 305 263   Blood Culture    Component Value Date/Time   SDES BLOOD RIGHT ANTECUBITAL 01/26/2018 2045   SDES BLOOD RIGHT HAND 01/26/2018 2045   SPECREQUEST  01/26/2018 2045    BOTTLES DRAWN AEROBIC AND ANAEROBIC Blood Culture adequate volume   SPECREQUEST  01/26/2018 2045    BOTTLES DRAWN AEROBIC ONLY Blood Culture results may not be optimal due  to an inadequate volume of blood received in culture bottles   CULT  01/26/2018 2045    NO GROWTH 3 DAYS Performed at [facility], [place] [address] [place], [place] [address]    CULT  01/26/2018 2045    NO GROWTH 3 DAYS Performed at [facility], [place] [address], [place], [place] [address]    REPTSTATUS PENDING 01/26/2018 2045   REPTSTATUS PENDING 01/26/2018 2045   Studies/Results: Ir Ivc Filter Plmt / S&i /img Guid/mod Sed  Result Date: 01/27/2018 INDICATION: Pulmonary thromboembolism.  Hemorrhagic shock. EXAM: IVC FILTER,INFERIOR VENA CAVOGRAM MEDICATIONS: None. ANESTHESIA/SEDATION: Fentanyl 50 mcg IV; Versed 1 mg IV Moderate Sedation Time:  16 minutes The patient was continuously monitored during the procedure by the interventional radiology nurse under my direct supervision. FLUOROSCOPY TIME:  Fluoroscopy Time: 1 minutes  seconds (131 mGy). COMPLICATIONS: None immediate. PROCEDURE: Informed written consent was obtained from the patient after a thorough discussion of the procedural risks, benefits and alternatives. All questions were addressed. Maximal Sterile Barrier Technique was utilized including caps, mask, sterile gowns, sterile gloves, sterile drape,  hand hygiene and skin antiseptic. A timeout was performed prior to the initiation of the procedure. The right neck was prepped with Betadine in a sterile fashion, and a sterile drape was applied covering the operative field. A sterile gown and sterile gloves were used for the procedure. The right jugular vein was noted to be patent initially with ultrasound. Under sonographic guidance, a micropuncture needle was inserted into the right jugular vein (Ultrasound image documentation was performed). It was removed over an 018 wire which was up-sized to a [place]. The sheath was inserted over the wire and into the IVC. IVC venography was performed. The temporary filter was then deployed in the infrarenal IVC. The sheath was removed and  hemostasis was achieved with direct pressure. FINDINGS: Venography demonstrates approximate renal vein inflow at L1. The patient is status post right nephrectomy and left renal embolization. There is no evidence of DVT or venous anomaly. The final image demonstrates an IVC filter with its tip positioned at the L1-2 disc level. IMPRESSION: Successful infrarenal IVC filter placement. This is a temporary filter. It can be removed or remain in place to become permanent. PLAN: This IVC filter is potentially retrievable. The patient will be assessed for filter retrieval by Interventional Radiology in approximately 8-12 weeks. Further recommendations regarding filter retrieval, continued surveillance or declaration of device permanence, will be made at that time. Electronically Signed   By: Marybelle Killings M.D.   On: 01/27/2018 16:31   Dg Chest Port 1 View  Result Date: 01/27/2018 CLINICAL DATA:  Short of breath EXAM: PORTABLE CHEST 1 VIEW COMPARISON:  CT chest 01/26/2018, radiograph 01/26/2018, 01/14/2018 FINDINGS: Marked cardiomegaly. Elevated left diaphragm with patchy airspace disease at the left base. No pneumothorax. IMPRESSION: 1. Marked cardiomegaly. 2. Elevated left diaphragm with patchy atelectasis or infiltrate at the left base. Electronically Signed   By: Donavan Foil M.D.   On: 01/27/2018 22:01   Medications: . sodium chloride    . sodium chloride    . ceFEPime (MAXIPIME) IV 1 g (01/28/18 2018)  . vancomycin 1,000 mg (01/27/18 2357)   . allopurinol  150 mg Oral QODAY  . amiodarone  100 mg Oral Daily  . chlorhexidine  15 mL Mouth Rinse BID  . Chlorhexidine Gluconate Cloth  6 each Topical Q0600  . feeding supplement (PRO-STAT SUGAR FREE 64)  30 mL Oral BID  . ferric citrate  630 mg Oral TID WC  . mouth rinse  15 mL Mouth Rinse q12n4p  . midodrine  5 mg Oral TID WC  . mirtazapine  7.5 mg Oral QHS  . multivitamin  1 tablet Oral QHS  . pantoprazole  40 mg Oral BID   Dialysis Orders: MWF at  [facility]  4h  111.5kg  2/2.25 bath  P4  AVF  Hep 4500+ 1598mid - Hectoral 10mcg IV q HD - Parsabiv 12.5mg  IV q HD - Mircera 53mcg q 2 weeks  Assessment/Plan: 1. R Pulmonary Embolii: Unable to be anticoagulated d/t recent renal hematoma. IVC filter placed 9/6 here; LLE DVT present. Still with increased respiratory effort and pleuritic CP. Refuses Bi-pap, remains on nasal O2 5L/min. Status still tenuous. Discussed situation w/ pt's wife Rose at pt's request.  2.  ?Pneumonia: ^ LA and pro-calcitonin. On Vanc/Cefepime to cover. WBC rising. 3. ESRD:MWF HD, next due 9/9. Did not tolerate well with last HD, will see how he does tomorrow. 4. Hypertension/volume:BP slightly high, last CXR 9/6 clear. At dry wt. Doubt has much extra vol, low UF goal  tomorrow 1-2L 5. Anemia:Hgb down to 8.3. Will give Aranesp tomorrow with HD. 6. Metabolic bone disease:Ca and Phos high on admit - d/c CaAcetate and ^ Auryxia. Holding off on Hectoral for now. 7. Nutrition:Alb low, added pro-stat supplements. Not really eating much. 8. L renal hemorrhage/hematoma (01/13/18): Stable on Abd CT. 9.  Hx a-flutter: On amiodarone. No anticoagulation for now 10.  NICM (EF 30-35%)  Veneta Penton, PA-C 01/29/2018, 10:56 AM  [facility] Pager: [phone]  Pt seen, examined and agree w A/P as above.  Kelly Splinter MD [employer] pager [phone]   01/29/2018, 1:29 PM

## 2018-01-29 NOTE — Progress Notes (Signed)
[facility]  PROGRESS NOTE  Miguelangel Korn TGG:[id number] DOB: May 11, 1961 DOA: 01/26/2018 PCP: Iona Beard, MD   Brief HPI:   57 year old male with a history of ESRD on hemodialysis Monday Wednesday Friday, hypertension, CVA, GERD, gout, atrial fibrillation not on anticoagulation, systolic CHF with EF 70%, CAD, status post left nephrectomy for spontaneous hemorrhage in the left kidney in the setting of supratherapeutic INR.  Came to hospital with worsening shortness of breath. CTA showed possibility of pulmonary embolism, pneumonia and also not excluded.  On IV antibiotics.  Chest x-ray also showed mild interstitial edema.    Subjective   Patient seen and examined, continues to be mildly tachypneic.  Does not want to be on Coumadin or any other anticoagulation.   Assessment/Plan:     1. Acute respiratory failure with hypoxia-likely due to pulmonary embolism, cannot use anticoagulation due to recent renal hemorrhage.  Admitting physician discussed with PCCM last night who recommended IVC filter placement.  Patient also started on vancomycin and cefepime for pneumonia, procalcitonin elevated to 4.02, lactic acid 2.75.  Pulmonology was consulted, no new recommendations.  Continue current management.    2. Pulmonary embolism-seen on CTA chest, IVC filter will be placed as patient is a high risk for bleed due to recent left renal hemorrhage.  Will not start anticoagulation at this time.  This was discussed by admitting physician with PCCM Dr. Lucile Shutters.  Will discuss with urology regarding timing of restarting anticoagulation.Discussed with patient regarding restarting Coumadin, he does not want to start Coumadin at this time.  3. Healthcare associated pneumonia-concern for pneumonia infectious process.  Significant elevation of procalcitonin 4.02, lactic acid 2.75.  Started on vancomycin and cefepime.  Will continue with antibiotics follow blood culture results.  4. Chronic combined systolic and  diastolic CHF-chest x-ray showed mild edema, 2D echo from October 2018 showed EF 30 to 35% with grade 1 diastolic dysfunction.  I have consulted nephrology for dialysis today.  5. ESRD on hemodialysis-patient gets similar to Friday, nephrology following , was dialysed yesterday.  Continue midodrine for hypotension  6. Recent history of hemorrhage of left kidney-CT scan of the abdomen showed unchanged appearance of moderate to large left renal and perinephric hematoma/hemorrhage descending into the pelvis.  Hemoglobin is stable at 8.8  7. CAD-stable  8. Atrial fibrillation- CHA2DS2VASc score is 5, patient needs oral anticoagulation but due to recent renal hemorrhage will not be able to start anticoagulation.  Heart rate controlled.  Continue amiodarone.  Telemetry monitoring.  9. Anemia-continue iron supplementation  10. Depression-continue mirtazapine  11. GERD-continue Protonix twice daily     CBG: Recent Labs  Lab 01/23/18 0017 01/23/18 0414 01/23/18 0757 01/23/18 1217 01/23/18 1555  GLUCAP 108* 72 79 95 109*    CBC: Recent Labs  Lab 01/23/18 0629 01/23/18 1301 01/26/18 1812 01/27/18 1856 01/28/18 0256  WBC  --  9.4 11.7* 12.1* 16.2*  HGB 8.8* 8.6* 9.4* 8.8* 8.3*  HCT 26.7* 26.2* 29.6* 27.8* 26.8*  MCV  --  94.9 97.4 97.9 97.5  PLT  --  275 311 305 350    Basic Metabolic Panel: Recent Labs  Lab 01/23/18 0629 01/26/18 1812 01/27/18 1856 01/28/18 0256  NA 132* 135 132* 133*  K 5.1 4.6 5.3* 5.0  CL 96* 92* 92* 92*  CO2 22 27 25 27   GLUCOSE 86 94 81 93  BUN 67* 36* 45* 30*  CREATININE 12.60* 8.91* 9.10* 7.59*  CALCIUM 10.1 10.4* 9.5 9.5  PHOS 5.6*  --  5.9*  --  DVT prophylaxis: No DVT prophylaxis as venous duplex of lower extremities pending to rule out DVT, cannot use heparin or Lovenox due to recent renal hemorrhage  Code Status: Full code  Family Communication: No family at bedside  Disposition Plan: likely home when medically ready for  discharge   Consultants:  None  Procedures:  None   Antibiotics:   Anti-infectives (From admission, onward)   Start     Dose/Rate Route Frequency Ordered Stop   01/27/18 2000  ceFEPIme (MAXIPIME) 1 g in sodium chloride 0.9 % 100 mL IVPB     1 g 200 mL/hr over 30 Minutes Intravenous Every 24 hours 01/26/18 1942     01/27/18 2000  vancomycin (VANCOCIN) IVPB 1000 mg/200 mL premix     1,000 mg 200 mL/hr over 60 Minutes Intravenous Every M-W-F (Hemodialysis) 01/27/18 1707     01/27/18 1200  vancomycin (VANCOCIN) IVPB 1000 mg/200 mL premix  Status:  Discontinued     1,000 mg 200 mL/hr over 60 Minutes Intravenous Every M-W-F (Hemodialysis) 01/26/18 1942 01/27/18 1707   01/26/18 2000  vancomycin (VANCOCIN) 2,000 mg in sodium chloride 0.9 % 500 mL IVPB     2,000 mg 250 mL/hr over 120 Minutes Intravenous  Once 01/26/18 1931 01/26/18 2121   01/26/18 1930  vancomycin (VANCOCIN) IVPB 1000 mg/200 mL premix  Status:  Discontinued     1,000 mg 200 mL/hr over 60 Minutes Intravenous  Once 01/26/18 1923 01/26/18 1931   01/26/18 1930  ceFEPIme (MAXIPIME) 2 g in sodium chloride 0.9 % 100 mL IVPB     2 g 200 mL/hr over 30 Minutes Intravenous  Once 01/26/18 1923 01/26/18 2122       Objective   Vitals:   01/29/18 0216 01/29/18 0459 01/29/18 0500 01/29/18 0744  BP:  120/81 120/81 (!) 157/64  Pulse:  99  100  Resp:  (!) 24 18 19   Temp:  98.7 F (37.1 C)  98.6 F (37 C)  TempSrc:  Oral  Oral  SpO2:  100% 98% 100%  Weight: 110.6 kg     Height:        Intake/Output Summary (Last 24 hours) at 01/29/2018 0934 Last data filed at 01/28/2018 2200 Gross per 24 hour  Intake 713.45 ml  Output -  Net 713.45 ml   Filed Weights   01/28/18 0350 01/29/18 0216  Weight: 113.2 kg 110.6 kg     Physical Examination:   Mouth: Oral mucosa is moist, no lesions on palate,  Neck: Supple, no deformities, masses, or tenderness Lungs: Normal respiratory effort, bibasilar crackles Heart: Regular rate  and rhythm, S1 and S2 normal, no murmurs, rubs auscultated Abdomen: BS normoactive,soft,nondistended,non-tender to palpation,no organomegaly Extremities: No pretibial edema, no erythema, no cyanosis, no clubbing Neuro : Alert and oriented to time, place and person, No focal deficits      Data Reviewed: I have personally reviewed following labs and imaging studies   Recent Results (from the past 240 hour(s))  Blood Culture (routine x 2)     Status: None (Preliminary result)   Collection Time: 01/26/18  8:45 PM  Result Value Ref Range Status   Specimen Description BLOOD RIGHT ANTECUBITAL  Final   Special Requests   Final    BOTTLES DRAWN AEROBIC AND ANAEROBIC Blood Culture adequate volume   Culture   Final    NO GROWTH 3 DAYS Performed at [facility], [address] [place], [place] [address]    Report Status PENDING  Incomplete  Blood Culture (routine x 2)     Status: None (Preliminary result)   Collection Time: 01/26/18  8:45 PM  Result Value Ref Range Status   Specimen Description BLOOD RIGHT HAND  Final   Special Requests   Final    BOTTLES DRAWN AEROBIC ONLY Blood Culture results may not be optimal due to an inadequate volume of blood received in culture bottles   Culture   Final    NO GROWTH 3 DAYS Performed at [facility], [place] [address] [place], [place] [address]    Report Status PENDING  Incomplete     Liver Function Tests: Recent Labs  Lab 01/23/18 0629 01/27/18 1856 01/28/18 0256  AST  --   --  33  ALT  --   --  32  ALKPHOS  --   --  90  BILITOT  --   --  1.1  PROT  --   --  7.4  ALBUMIN 2.5* 2.6* 2.5*   No results for input(s): LIPASE, AMYLASE in the last 168 hours. No results for input(s): AMMONIA in the last 168 hours.  Cardiac Enzymes: No results for input(s): CKTOTAL, CKMB, CKMBINDEX, TROPONINI in the last 168 hours. BNP (last 3 results) Recent Labs    07/18/17 0531  BNP 1,325.2*    ProBNP (last 3 results) No results for  input(s): PROBNP in the last 8760 hours.    Studies: Ir Ivc Filter Plmt / S&i /img Guid/mod Sed  Result Date: 01/27/2018 INDICATION: Pulmonary thromboembolism.  Hemorrhagic shock. EXAM: IVC FILTER,INFERIOR VENA CAVOGRAM MEDICATIONS: None. ANESTHESIA/SEDATION: Fentanyl 50 mcg IV; Versed 1 mg IV Moderate Sedation Time:  16 minutes The patient was continuously monitored during the procedure by the interventional radiology nurse under my direct supervision. FLUOROSCOPY TIME:  Fluoroscopy Time: 1 minutes  seconds (131 mGy). COMPLICATIONS: None immediate. PROCEDURE: Informed written consent was obtained from the patient after a thorough discussion of the procedural risks, benefits and alternatives. All questions were addressed. Maximal Sterile Barrier Technique was utilized including caps, mask, sterile gowns, sterile gloves, sterile drape, hand hygiene and skin antiseptic. A timeout was performed prior to the initiation of the procedure. The right neck was prepped with Betadine in a sterile fashion, and a sterile drape was applied covering the operative field. A sterile gown and sterile gloves were used for the procedure. The right jugular vein was noted to be patent initially with ultrasound. Under sonographic guidance, a micropuncture needle was inserted into the right jugular vein (Ultrasound image documentation was performed). It was removed over an 018 wire which was up-sized to a Plain. The sheath was inserted over the wire and into the IVC. IVC venography was performed. The temporary filter was then deployed in the infrarenal IVC. The sheath was removed and hemostasis was achieved with direct pressure. FINDINGS: Venography demonstrates approximate renal vein inflow at L1. The patient is status post right nephrectomy and left renal embolization. There is no evidence of DVT or venous anomaly. The final image demonstrates an IVC filter with its tip positioned at the L1-2 disc level. IMPRESSION: Successful  infrarenal IVC filter placement. This is a temporary filter. It can be removed or remain in place to become permanent. PLAN: This IVC filter is potentially retrievable. The patient will be assessed for filter retrieval by Interventional Radiology in approximately 8-12 weeks. Further recommendations regarding filter retrieval, continued surveillance or declaration of device permanence, will be made at that time. Electronically Signed   By: Rodena Goldmann.D.  On: 01/27/2018 16:31   Dg Chest Port 1 View  Result Date: 01/27/2018 CLINICAL DATA:  Short of breath EXAM: PORTABLE CHEST 1 VIEW COMPARISON:  CT chest 01/26/2018, radiograph 01/26/2018, 01/14/2018 FINDINGS: Marked cardiomegaly. Elevated left diaphragm with patchy airspace disease at the left base. No pneumothorax. IMPRESSION: 1. Marked cardiomegaly. 2. Elevated left diaphragm with patchy atelectasis or infiltrate at the left base. Electronically Signed   By: Donavan Foil M.D.   On: 01/27/2018 22:01    Scheduled Meds: . allopurinol  150 mg Oral QODAY  . amiodarone  100 mg Oral Daily  . chlorhexidine  15 mL Mouth Rinse BID  . Chlorhexidine Gluconate Cloth  6 each Topical Q0600  . feeding supplement (PRO-STAT SUGAR FREE 64)  30 mL Oral BID  . ferric citrate  630 mg Oral TID WC  . mouth rinse  15 mL Mouth Rinse q12n4p  . midodrine  5 mg Oral TID WC  . mirtazapine  7.5 mg Oral QHS  . multivitamin  1 tablet Oral QHS  . pantoprazole  40 mg Oral BID      Time spent: 25 min  [facility] Pager [phone]. If 7PM-7AM, please contact night-coverage at [email or web address], Office  [phone]  password [place]  01/29/2018, 9:34 AM  LOS: 3 days

## 2018-01-29 NOTE — Progress Notes (Signed)
Pt is still having elevated RR, Team nursing and wife talk to pt about trying the BIPAP tonight which he agrees to it. Respiratory and provider on call notified notified. We'll try an anti anxiety med before we put the patient on the BIPAP. We'll continue to monitor.

## 2018-01-30 LAB — RENAL FUNCTION PANEL
Albumin: 2.3 g/dL — ABNORMAL LOW (ref 3.5–5.0)
Albumin: 2.3 g/dL — ABNORMAL LOW (ref 3.5–5.0)
Anion gap: 17 — ABNORMAL HIGH (ref 5–15)
Anion gap: 15 (ref 5–15)
BUN: 77 mg/dL — ABNORMAL HIGH (ref 6–20)
BUN: 29 mg/dL — ABNORMAL HIGH (ref 6–20)
CO2: 25 mmol/L (ref 22–32)
CO2: 26 mmol/L (ref 22–32)
Calcium: 10.2 mg/dL (ref 8.9–10.3)
Calcium: 9.5 mg/dL (ref 8.9–10.3)
Chloride: 94 mmol/L — ABNORMAL LOW (ref 98–111)
Chloride: 94 mmol/L — ABNORMAL LOW (ref 98–111)
Creatinine, Ser: 13.13 mg/dL — ABNORMAL HIGH (ref 0.61–1.24)
Creatinine, Ser: 6.79 mg/dL — ABNORMAL HIGH (ref 0.61–1.24)
GFR calc Af Amer: 4 mL/min — ABNORMAL LOW (ref >60)
GFR calc Af Amer: 10 mL/min — ABNORMAL LOW (ref >60)
GFR calc non Af Amer: 4 mL/min — ABNORMAL LOW (ref >60)
GFR calc non Af Amer: 8 mL/min — ABNORMAL LOW (ref >60)
Glucose, Bld: 84 mg/dL (ref 70–99)
Glucose, Bld: 100 mg/dL — ABNORMAL HIGH (ref 70–99)
Phosphorus: 8.1 mg/dL — ABNORMAL HIGH (ref 2.5–4.6)
Phosphorus: 3.6 mg/dL (ref 2.5–4.6)
Potassium: 5.3 mmol/L — ABNORMAL HIGH (ref 3.5–5.1)
Potassium: 4.2 mmol/L (ref 3.5–5.1)
Sodium: 136 mmol/L (ref 135–145)
Sodium: 135 mmol/L (ref 135–145)

## 2018-01-30 LAB — BLOOD GAS, ARTERIAL
Acid-Base Excess: 5.8 mmol/L — ABNORMAL HIGH (ref 0.0–2.0)
Bicarbonate: 30 mmol/L — ABNORMAL HIGH (ref 20.0–28.0)
Drawn by: 511851
O2 Content: 4 L/min
O2 Saturation: 96.3 %
Patient temperature: 98.6
pCO2 arterial: 45.9 mmHg (ref 32.0–48.0)
pH, Arterial: 7.431 (ref 7.350–7.450)
pO2, Arterial: 80.8 mmHg — ABNORMAL LOW (ref 83.0–108.0)

## 2018-01-30 LAB — CBC
HCT: 25.6 % — ABNORMAL LOW (ref 39.0–52.0)
HCT: 27.9 % — ABNORMAL LOW (ref 39.0–52.0)
Hemoglobin: 8 g/dL — ABNORMAL LOW (ref 13.0–17.0)
Hemoglobin: 8.9 g/dL — ABNORMAL LOW (ref 13.0–17.0)
MCH: 30.7 pg (ref 26.0–34.0)
MCH: 30.9 pg (ref 26.0–34.0)
MCHC: 31.3 g/dL (ref 30.0–36.0)
MCHC: 31.9 g/dL (ref 30.0–36.0)
MCV: 98.1 fL (ref 78.0–100.0)
MCV: 96.9 fL (ref 78.0–100.0)
Platelets: 272 10*3/uL (ref 150–400)
Platelets: 257 10*3/uL (ref 150–400)
RBC: 2.61 MIL/uL — ABNORMAL LOW (ref 4.22–5.81)
RBC: 2.88 MIL/uL — ABNORMAL LOW (ref 4.22–5.81)
RDW: 15.9 % — ABNORMAL HIGH (ref 11.5–15.5)
RDW: 15.8 % — ABNORMAL HIGH (ref 11.5–15.5)
WBC: 14.7 10*3/uL — ABNORMAL HIGH (ref 4.0–10.5)
WBC: 15.1 10*3/uL — ABNORMAL HIGH (ref 4.0–10.5)

## 2018-01-30 LAB — GLUCOSE, CAPILLARY
Glucose-Capillary: 80 mg/dL (ref 70–99)
Glucose-Capillary: 91 mg/dL (ref 70–99)
Glucose-Capillary: 151 mg/dL — ABNORMAL HIGH (ref 70–99)

## 2018-01-30 MED ORDER — DARBEPOETIN ALFA 200 MCG/0.4ML IJ SOSY
PREFILLED_SYRINGE | INTRAMUSCULAR | Status: AC
  Filled 2018-01-30: qty 0.4

## 2018-01-30 MED ORDER — SODIUM CHLORIDE 0.9 % IV BOLUS
250.0000 mL | Freq: Once | INTRAVENOUS | Status: AC
  Administered 2018-01-30: 250 mL via INTRAVENOUS

## 2018-01-30 MED ORDER — MIDODRINE HCL 5 MG PO TABS
ORAL_TABLET | ORAL | Status: AC
  Filled 2018-01-30: qty 1

## 2018-01-30 MED ORDER — DARBEPOETIN ALFA 200 MCG/0.4ML IJ SOSY
200.0000 ug | PREFILLED_SYRINGE | INTRAMUSCULAR | Status: DC
  Administered 2018-01-30 – 2018-02-06 (×2): 200 ug via INTRAVENOUS
  Filled 2018-01-30: qty 0.4

## 2018-01-30 MED ORDER — ACETAMINOPHEN 650 MG RE SUPP
650.0000 mg | Freq: Three times a day (TID) | RECTAL | Status: DC | PRN
  Administered 2018-01-30: 650 mg via RECTAL
  Filled 2018-01-30: qty 1

## 2018-01-30 MED ORDER — HEPARIN (PORCINE) IN NACL 100-0.45 UNIT/ML-% IJ SOLN
1500.0000 [IU]/h | INTRAMUSCULAR | Status: DC
  Administered 2018-01-30: 1200 [IU]/h via INTRAVENOUS
  Administered 2018-01-31: 1400 [IU]/h via INTRAVENOUS
  Filled 2018-01-30 (×2): qty 250

## 2018-01-30 MED ORDER — VANCOMYCIN HCL IN DEXTROSE 1-5 GM/200ML-% IV SOLN
INTRAVENOUS | Status: AC
  Administered 2018-01-30: 1000 mg
  Filled 2018-01-30: qty 200

## 2018-01-30 NOTE — Progress Notes (Addendum)
Called by RN that patient developed tachypnea, fever 101, tachycardia also was confused.  Put on BiPAP.  Came to see patient, taken off BiPAP he is answering questions appropriately.  Asked him about anticoagulation and he still does not want to be on Coumadin.  I explained to him that he has life-threatening condition and he needs anticoagulations.  I doubt patient has no insight to understand the implications at this time.  On exam-decreased breath sounds bilaterally, mild tachypnea  I called and discussed with urologist on-call Dr. Audley Hose, who reviewed the chart and says it is okay to start anticoagulation.  And patient was discharged on 01/23/2018 was recommended to stop anticoagulation for next 7 days..  I called and discussed with patient's wife,.  She agrees with heparin.  And she understands that there is always a risk of bleed with any sort of anticoagulation.  Assessment   acute respiratory failure with hypoxia Pulmonary embolism  We will start patient on heparin per pharmacy consultation.  Critical care time spent 40 minutes

## 2018-01-30 NOTE — Progress Notes (Signed)
[facility]  PROGRESS NOTE  [name] QMV:[id number] DOB: 03-10-1961 DOA: 01/26/2018 PCP: Iona Beard, MD   Brief HPI:   57 year old male with a history of ESRD on hemodialysis Monday Wednesday Friday, hypertension, CVA, GERD, gout, atrial fibrillation not on anticoagulation, systolic CHF with EF 28%, CAD, status post left nephrectomy for spontaneous hemorrhage in the left kidney in the setting of supratherapeutic INR.  Came to hospital with worsening shortness of breath. CTA showed possibility of pulmonary embolism, pneumonia and also not excluded.  On IV antibiotics.  Chest x-ray also showed mild interstitial edema.    Subjective   Patient seen and examined, breathing has improved.  Seen during hemodialysis.   Assessment/Plan:     1. Acute respiratory failure with hypoxia-likely due to pulmonary embolism, cannot use anticoagulation due to recent renal hemorrhage.  Admitting physician discussed with PCCM last night who recommended IVC filter placement.  Patient also started on vancomycin and cefepime for pneumonia, procalcitonin elevated to 4.02, lactic acid 2.75.  Pulmonology was consulted, no new recommendations.  Continue current management.    2. Pulmonary embolism-seen on CTA chest, IVC filter will be placed as patient is a high risk for bleed due to recent left renal hemorrhage.  Will not start anticoagulation at this time.  This was discussed by admitting physician with PCCM Dr. Lucile Shutters.  Will discuss with urology regarding timing of restarting anticoagulation.Discussed with patient regarding restarting Coumadin, he does not want to start Coumadin at this time.  3. Healthcare associated pneumonia-concern for pneumonia infectious process.  Significant elevation of procalcitonin 4.02, lactic acid 2.75.  Started on vancomycin and cefepime.  Will continue with antibiotics follow blood culture results.  4. Chronic combined systolic and diastolic CHF-improving with hemodialysis chest  x-ray showed mild edema, 2D echo from October 2018 showed EF 30 to 35% with grade 1 diastolic dysfunction.   5. ESRD on hemodialysis-patient gets similar to Friday, nephrology following , was dialysed yesterday.  Continue midodrine for hypotension  6. Recent history of hemorrhage of left kidney-CT scan of the abdomen showed unchanged appearance of moderate to large left renal and perinephric hematoma/hemorrhage descending into the pelvis.  Hemoglobin is stable at 8.8  7. CAD-stable  8. Atrial fibrillation- CHA2DS2VASc score is 5, patient needs oral anticoagulation but due to recent renal hemorrhage will not be able to start anticoagulation.  Heart rate controlled.  Continue amiodarone.  Telemetry monitoring.  9. Anemia-continue iron supplementation  10. Depression-continue mirtazapine  11. GERD-continue Protonix twice daily     CBG: Recent Labs  Lab 01/23/18 1555 01/29/18 1104 01/29/18 2037 01/30/18 0756  GLUCAP 109* 120* 104* 80    CBC: Recent Labs  Lab 01/26/18 1812 01/27/18 1856 01/28/18 0256 01/30/18 0733  WBC 11.7* 12.1* 16.2* 14.7*  HGB 9.4* 8.8* 8.3* 8.0*  HCT 29.6* 27.8* 26.8* 25.6*  MCV 97.4 97.9 97.5 98.1  PLT 311 305 263 413    Basic Metabolic Panel: Recent Labs  Lab 01/26/18 1812 01/27/18 1856 01/28/18 0256 01/30/18 0733  NA 135 132* 133* 136  K 4.6 5.3* 5.0 5.3*  CL 92* 92* 92* 94*  CO2 27 25 27 25   GLUCOSE 94 81 93 84  BUN 36* 45* 30* 77*  CREATININE 8.91* 9.10* 7.59* 13.13*  CALCIUM 10.4* 9.5 9.5 10.2  PHOS  --  5.9*  --  8.1*     DVT prophylaxis: No DVT prophylaxis as venous duplex of lower extremities pending to rule out DVT, cannot use heparin or Lovenox due to recent  renal hemorrhage  Code Status: Full code  Family Communication: No family at bedside  Disposition Plan: likely home when medically ready for discharge   Consultants:  None  Procedures:  None   Antibiotics:   Anti-infectives (From admission, onward)    Start     Dose/Rate Route Frequency Ordered Stop   01/30/18 1029  vancomycin (VANCOCIN) 1-5 GM/200ML-% IVPB    Note to Pharmacy:  Almira Bar   : cabinet override      01/30/18 1029 01/30/18 1144   01/27/18 2000  ceFEPIme (MAXIPIME) 1 g in sodium chloride 0.9 % 100 mL IVPB     1 g 200 mL/hr over 30 Minutes Intravenous Every 24 hours 01/26/18 1942     01/27/18 2000  vancomycin (VANCOCIN) IVPB 1000 mg/200 mL premix     1,000 mg 200 mL/hr over 60 Minutes Intravenous Every M-W-F (Hemodialysis) 01/27/18 1707     01/27/18 1200  vancomycin (VANCOCIN) IVPB 1000 mg/200 mL premix  Status:  Discontinued     1,000 mg 200 mL/hr over 60 Minutes Intravenous Every M-W-F (Hemodialysis) 01/26/18 1942 01/27/18 1707   01/26/18 2000  vancomycin (VANCOCIN) 2,000 mg in sodium chloride 0.9 % 500 mL IVPB     2,000 mg 250 mL/hr over 120 Minutes Intravenous  Once 01/26/18 1931 01/26/18 2121   01/26/18 1930  vancomycin (VANCOCIN) IVPB 1000 mg/200 mL premix  Status:  Discontinued     1,000 mg 200 mL/hr over 60 Minutes Intravenous  Once 01/26/18 1923 01/26/18 1931   01/26/18 1930  ceFEPIme (MAXIPIME) 2 g in sodium chloride 0.9 % 100 mL IVPB     2 g 200 mL/hr over 30 Minutes Intravenous  Once 01/26/18 1923 01/26/18 2122       Objective   Vitals:   01/30/18 1114 01/30/18 1130 01/30/18 1200 01/30/18 1230  BP: 127/67 (!) 108/52 (!) 114/45 (!) 128/51  Pulse: 91 91 82 97  Resp:      Temp:      TempSrc:      SpO2:      Weight:      Height:        Intake/Output Summary (Last 24 hours) at 01/30/2018 1305 Last data filed at 01/30/2018 1254 Gross per 24 hour  Intake 250 ml  Output 2173 ml  Net -1923 ml   Filed Weights   01/29/18 0216 01/30/18 0539 01/30/18 0830  Weight: 110.6 kg 110.9 kg 110.7 kg     Physical Examination:   Mouth: Oral mucosa is moist, no lesions on palate,  Neck: Supple, no deformities, masses, or tenderness Lungs: Normal respiratory effort, bilateral clear to auscultation, no  crackles or wheezes.  Heart: Regular rate and rhythm, S1 and S2 normal, no murmurs, rubs auscultated Abdomen: BS normoactive,soft,nondistended,non-tender to palpation,no organomegaly Extremities: No pretibial edema, no erythema, no cyanosis, no clubbing Neuro : Alert and oriented to time, place and person, No focal deficits       Data Reviewed: I have personally reviewed following labs and imaging studies   Recent Results (from the past 240 hour(s))  Blood Culture (routine x 2)     Status: None (Preliminary result)   Collection Time: 01/26/18  8:45 PM  Result Value Ref Range Status   Specimen Description BLOOD RIGHT ANTECUBITAL  Final   Special Requests   Final    BOTTLES DRAWN AEROBIC AND ANAEROBIC Blood Culture adequate volume   Culture   Final    NO GROWTH 4 DAYS Performed at [facility],  [address], [place], [place] [address]    Report Status PENDING  Incomplete  Blood Culture (routine x 2)     Status: None (Preliminary result)   Collection Time: 01/26/18  8:45 PM  Result Value Ref Range Status   Specimen Description BLOOD RIGHT HAND  Final   Special Requests   Final    BOTTLES DRAWN AEROBIC ONLY Blood Culture results may not be optimal due to an inadequate volume of blood received in culture bottles   Culture   Final    NO GROWTH 4 DAYS Performed at [facility], [place] [address] [place], [place] [address]    Report Status PENDING  Incomplete     Liver Function Tests: Recent Labs  Lab 01/27/18 1856 01/28/18 0256 01/30/18 0733  AST  --  33  --   ALT  --  32  --   ALKPHOS  --  90  --   BILITOT  --  1.1  --   PROT  --  7.4  --   ALBUMIN 2.6* 2.5* 2.3*   No results for input(s): LIPASE, AMYLASE in the last 168 hours. No results for input(s): AMMONIA in the last 168 hours.  Cardiac Enzymes: No results for input(s): CKTOTAL, CKMB, CKMBINDEX, TROPONINI in the last 168 hours. BNP (last 3 results) Recent Labs    07/18/17 0531  BNP 1,325.2*     ProBNP (last 3 results) No results for input(s): PROBNP in the last 8760 hours.    Studies: No results found.  Scheduled Meds: . allopurinol  150 mg Oral QODAY  . amiodarone  100 mg Oral Daily  . chlorhexidine  15 mL Mouth Rinse BID  . Chlorhexidine Gluconate Cloth  6 each Topical Q0600  . Darbepoetin Alfa      . darbepoetin (ARANESP) injection - DIALYSIS  200 mcg Intravenous Q Mon-HD  . feeding supplement (PRO-STAT SUGAR FREE 64)  30 mL Oral BID  . ferric citrate  630 mg Oral TID WC  . mouth rinse  15 mL Mouth Rinse q12n4p  . midodrine      . midodrine  5 mg Oral TID WC  . mirtazapine  7.5 mg Oral QHS  . multivitamin  1 tablet Oral QHS  . pantoprazole  40 mg Oral BID      Time spent: 25 min  [facility] [place] [facility] Pager [phone]. If 7PM-7AM, please contact night-coverage at [email or web address], Office  [phone]  password [email or web address]  01/30/2018, 1:05 PM  LOS: 4 days

## 2018-01-30 NOTE — Progress Notes (Signed)
ANTICOAGULATION CONSULT NOTE - Initial Consult  Pharmacy Consult for heparin Indication: pulmonary embolus  Allergies  Allergen Reactions  . Ace Inhibitors Itching and Cough    Patient Measurements: Height: 6\' 1"  (185.4 cm) Weight: 238 lb 12.1 oz (108.3 kg) IBW/kg (Calculated) : 79.9 Heparin Dosing Weight: 102 kg   Vital Signs: Temp: 101.3 F (38.5 C) (09/09 1733) Temp Source: Axillary (09/09 1733) BP: 101/55 (09/09 1733) Pulse Rate: 116 (09/09 1733)  Labs: Recent Labs    01/28/18 0256 01/30/18 0733 01/30/18 1657  HGB 8.3* 8.0* 8.9*  HCT 26.8* 25.6* 27.9*  PLT 263 272 257  CREATININE 7.59* 13.13* 6.79*    Estimated Creatinine Clearance: 16.1 mL/min (A) (by C-G formula based on SCr of 6.79 mg/dL (H)).   Medical History: Past Medical History:  Diagnosis Date  . Atrial flutter ([place])   . 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 Cardiomyopathy   . CHF (congestive heart failure) ([place])   . Coronary artery disease    normal coronaries by 10/10/08 cath, Cardiac Cath 08-04-12 epic.Dr. Doylene Canard follows  . Diabetes mellitus    pt. states he's borderline diabetic., no longer taking med,- off med. since 2013  . Dialysis patient [facility])    Mon-Wed-Fri([place])- Left AV fistula  . Diverticulitis November 2016   reoccurred in December 2016  . ESRD (end stage renal disease) ([place])   . GERD (gastroesophageal reflux disease)   . Gout   . History of nephrectomy 07/04/2012   History of right nephrectomy in 2002 for renal cell carcinoma   . History of unilateral nephrectomy   . Hypertension   . Low iron   . Myocardial infarction [facility]) ?2006  . Renal cell carcinoma    dialysis- MWF- Dr. Mercy Moore follows.  . Renal insufficiency   . Shortness of breath 05/19/11   "at rest, lying down, w/exertion"  . Stroke [facility]) 02/2011   05/19/11 denies residual  . Umbilical hernia 38/75/64   unrepaired    Medications:  Scheduled:  . allopurinol  150 mg  Oral QODAY  . amiodarone  100 mg Oral Daily  . chlorhexidine  15 mL Mouth Rinse BID  . Chlorhexidine Gluconate Cloth  6 each Topical Q0600  . Darbepoetin Alfa      . darbepoetin (ARANESP) injection - DIALYSIS  200 mcg Intravenous Q Mon-HD  . feeding supplement (PRO-STAT SUGAR FREE 64)  30 mL Oral BID  . ferric citrate  630 mg Oral TID WC  . mouth rinse  15 mL Mouth Rinse q12n4p  . midodrine  5 mg Oral TID WC  . mirtazapine  7.5 mg Oral QHS  . multivitamin  1 tablet Oral QHS  . pantoprazole  40 mg Oral BID    Assessment: 3 yom who has known hx of ESRD (MWF), atrial fibrillation and is s/p L nephrectomy for spontaneous hemorrhage in L kidney (in setting of supratherapeutic INR on warfarin). Has not been on anticoagulation for previous listed reason.   CTA found probable R upper lobe PE without R heart strain. Patient continues to have SOB. Has IVC filter placed. Hgb 8.9, plt 257. No s/sx of bleeding currently. Discussed with MD and given high bleed risk will be conservative in heparin dosing with reduced goal and no bolus despite known PE.   Goal of Therapy:  Heparin level 0.3-0.5 units/ml Monitor platelets by anticoagulation protocol: Yes   Plan:  Start heparin infusion at 1200 units/hr Check anti-Xa level  in 8 hours and daily while on heparin Continue to monitor H&H and platelets  Doylene Canard, PharmD Clinical Pharmacist  Pager: [phone] Phone: [phone] 01/30/2018,6:59 PM

## 2018-01-30 NOTE — Progress Notes (Signed)
Pt refused to eat and drink. Came back from dialysis , Pt HR 120 BPL BP 136/99 , Blood glucose 91 mg/dl. Got report from RN Minna Merritts from dialysis, Pt has had muscle jerking and shivering. The symptom had continued at arrival to his room at unit 4E. On O2 NCL 4 LPM, Spo2 99-100 % RR 24. Will continue to monitor.  Nora Rooke. RN

## 2018-01-30 NOTE — Progress Notes (Signed)
Subjective: Interval History: has complaints stillSOB., threw up some phlegm.  Objective: Vital signs in last 24 hours: Temp:  [97.7 F (36.5 C)-98.3 F (36.8 C)] 98 F (36.7 C) (09/09 0830) Pulse Rate:  [86-98] 89 (09/09 0900) Resp:  [14-43] 14 (09/09 0830) BP: (112-148)/(60-82) 112/60 (09/09 0900) SpO2:  [96 %-100 %] 100 % (09/09 0830) FiO2 (%):  [50 %] 50 % (09/09 0830) Weight:  [110.7 kg-110.9 kg] 110.7 kg (09/09 0830) Weight change: 0.272 kg  Intake/Output from previous day: 09/08 0701 - 09/09 0700 In: 750 [P.O.:750] Out: -  Intake/Output this shift: No intake/output data recorded.  General appearance: alert, cooperative, moderately obese, pale and on BIPAP Resp: diminished breath sounds bilaterally Cardio: S1, S2 normal GI: obese, pos bs, liver down 5 cm Extremities: AVF LLA  Lab Results: Recent Labs    01/28/18 0256 01/30/18 0733  WBC 16.2* 14.7*  HGB 8.3* 8.0*  HCT 26.8* 25.6*  PLT 263 272   BMET:  Recent Labs    01/28/18 0256 01/30/18 0733  NA 133* 136  K 5.0 5.3*  CL 92* 94*  CO2 27 25  GLUCOSE 93 84  BUN 30* 77*  CREATININE 7.59* 13.13*  CALCIUM 9.5 10.2   No results for input(s): PTH in the last 72 hours. Iron Studies: No results for input(s): IRON, TIBC, TRANSFERRIN, FERRITIN in the last 72 hours.  Studies/Results: No results found.  I have reviewed the patient's current medications.  Assessment/Plan: 1 ESRD for HD, vol xs 2 Anemia bleed and needs ESA 3 Obesity 4 PE s/p filter 5 RP bleed 6 DM controlled P BIPAP, HD, esa, check Fe    LOS: 4 days   Jeneen Rinks [place] 01/30/2018,9:06 AM

## 2018-01-30 NOTE — Progress Notes (Signed)
PT has dialysis scheduled this AM. Report given to dialysis RN. Respiratory team also informed about transporting Pt with the BIPAP to the dialysis unit. Pt resting bed eyes closed, no sign of distress noted.

## 2018-01-30 NOTE — Procedures (Signed)
I was present at this session.  I have reviewed the session itself and made appropriate changes. HD via LLA AVF, bp 120s , on BIPAP.  Will lower vol and solute. Access press ok.  Jeneen Rinks Marcelline Temkin 9/9/20199:05 AM

## 2018-01-30 NOTE — Progress Notes (Signed)
Pt kept the BIPAP throughout the night, tolerated well. Pt had a large BM this am on the [facility] Pt tolerated getting out of bed fairly. CHG bath provided. VSS.

## 2018-01-30 NOTE — Progress Notes (Signed)
Pharmacy Antibiotic Note  Kevan Prouty is a 57 y.o. male admitted on 01/26/2018 with pneumonia.  Pharmacy has been consulted for vancomycin and cefepime dosing.   The patient is ESRD-MWF and is currently on schedule. The patient received HD on 9/6 (3hr BFR 350-400) and is currently receiving 9/9. Current doses remain appropriate at this time.   Plan: - Continue Cefepime 1 gm IV every 24 hours - Continue Vancomycin 1g IV with HD-MWF - Will continue to follow HD schedule/duration, culture results, LOT, and antibiotic de-escalation plans   Height: 6\' 1"  (185.4 cm) Weight: 244 lb 0.8 oz (110.7 kg) IBW/kg (Calculated) : 79.9  Temp (24hrs), Avg:98 F (36.7 C), Min:97.7 F (36.5 C), Max:98.3 F (36.8 C)  Recent Labs  Lab 01/23/18 1301 01/26/18 1812 01/26/18 2026 01/27/18 0222 01/27/18 0833 01/27/18 1856 01/28/18 0256 01/30/18 0733  WBC 9.4 11.7*  --   --   --  12.1* 16.2* 14.7*  CREATININE  --  8.91*  --   --   --  9.10* 7.59* 13.13*  LATICACIDVEN  --   --  2.75* 1.3 0.9  --   --   --     Estimated Creatinine Clearance: 8.4 mL/min (A) (by C-G formula based on SCr of 13.13 mg/dL (H)).    Allergies  Allergen Reactions  . Ace Inhibitors Itching and Cough    Antimicrobials this admission: Vanc 9/5>> - Doses: 2g (9/5 @ 2121), 1g (9/6 @ 2357) - HD 9/6 (3h, BFR 350-400, end 2130), 9/9 (receiving) Cefepime 9/5>>  Dose adjustments this admission: None  Microbiology results: 9/5 blood x 2 - ngtd 9/5 sputum -   Thank you for allowing pharmacy to be a part of this patient's care.  Alycia Rossetti, PharmD, BCPS Clinical Pharmacist Pager: [phone] Clinical phone for 01/30/2018 from 7a-3:30p: [phone] If after 3:30p, please call main pharmacy at: [phone] Please check AMION for all [place] numbers 01/30/2018 11:11 AM

## 2018-01-30 NOTE — Progress Notes (Signed)
Pt's HR 110-120 BPM, BP 101/55 mmHg , remains stable, tachypnea, RR 30-45 bpm, SPO2 98-100% . Temp 101.3 F , Pt has new onset confusion, forgetful and delayed response. Notified Dr Darrick Meigs, order received for 250 ml of 0.9% NSS IV  bolus.  Will continue to monitor.  Kennyth Lose, RN

## 2018-01-31 LAB — CBC
HCT: 23.4 % — ABNORMAL LOW (ref 39.0–52.0)
Hemoglobin: 7.2 g/dL — ABNORMAL LOW (ref 13.0–17.0)
MCH: 30.6 pg (ref 26.0–34.0)
MCHC: 30.8 g/dL (ref 30.0–36.0)
MCV: 99.6 fL (ref 78.0–100.0)
Platelets: 256 10*3/uL (ref 150–400)
RBC: 2.35 MIL/uL — ABNORMAL LOW (ref 4.22–5.81)
RDW: 16.1 % — ABNORMAL HIGH (ref 11.5–15.5)
WBC: 13.7 10*3/uL — ABNORMAL HIGH (ref 4.0–10.5)

## 2018-01-31 LAB — CULTURE, BLOOD (ROUTINE X 2)
Culture: NO GROWTH
Culture: NO GROWTH
Special Requests: ADEQUATE

## 2018-01-31 LAB — COMPREHENSIVE METABOLIC PANEL
ALT: 25 U/L (ref 0–44)
AST: 44 U/L — ABNORMAL HIGH (ref 15–41)
Albumin: 2.3 g/dL — ABNORMAL LOW (ref 3.5–5.0)
Alkaline Phosphatase: 93 U/L (ref 38–126)
Anion gap: 13 (ref 5–15)
BUN: 38 mg/dL — ABNORMAL HIGH (ref 6–20)
CO2: 27 mmol/L (ref 22–32)
Calcium: 9.8 mg/dL (ref 8.9–10.3)
Chloride: 96 mmol/L — ABNORMAL LOW (ref 98–111)
Creatinine, Ser: 8.22 mg/dL — ABNORMAL HIGH (ref 0.61–1.24)
GFR calc Af Amer: 8 mL/min — ABNORMAL LOW (ref >60)
GFR calc non Af Amer: 7 mL/min — ABNORMAL LOW (ref >60)
Glucose, Bld: 98 mg/dL (ref 70–99)
Potassium: 4.8 mmol/L (ref 3.5–5.1)
Sodium: 136 mmol/L (ref 135–145)
Total Bilirubin: 1.1 mg/dL (ref 0.3–1.2)
Total Protein: 8.2 g/dL — ABNORMAL HIGH (ref 6.5–8.1)

## 2018-01-31 LAB — GLUCOSE, CAPILLARY: Glucose-Capillary: 109 mg/dL — ABNORMAL HIGH (ref 70–99)

## 2018-01-31 LAB — HEPARIN LEVEL (UNFRACTIONATED)
Heparin Unfractionated: 0.18 IU/mL — ABNORMAL LOW (ref 0.30–0.70)
Heparin Unfractionated: 0.27 IU/mL — ABNORMAL LOW (ref 0.30–0.70)

## 2018-01-31 MED ORDER — CHLORHEXIDINE GLUCONATE CLOTH 2 % EX PADS: 6.0000 | MEDICATED_PAD | Freq: Every day | CUTANEOUS | Status: DC

## 2018-01-31 NOTE — Progress Notes (Signed)
Pt found to be very diaphoretic, with RR in 30-40. RT put him back on the BIPAP. Pt resting now in bed eyes closed with RR od 14, O2 sat at 100. HR 95. Will continue to monitor.

## 2018-01-31 NOTE — Progress Notes (Addendum)
[facility]  PROGRESS NOTE  [name] WIO:[id number] DOB: [birth year] DOA: 01/26/2018 PCP: Iona Beard, MD   Brief HPI:   57 year old male with a history of ESRD on hemodialysis Monday Wednesday Friday, hypertension, CVA, GERD, gout, atrial fibrillation not on anticoagulation, systolic CHF with EF 42%, CAD, status post left nephrectomy for spontaneous hemorrhage in the left kidney in the setting of supratherapeutic INR.  Came to hospital with worsening shortness of breath. CTA showed possibility of pulmonary embolism, pneumonia and also not excluded.  On IV antibiotics.  Chest x-ray also showed mild interstitial edema.  Patient continued on IV antibiotics, underwent hemodialysis.  Continues to have intermittent episodes of tachypnea and shortness of breath requiring BiPAP.  Started on IV heparin after discussion with urology.  Patient and wife both understand the risk of bleeding with IV heparin.    Subjective   Patient seen and examined, denies any complaints.  No chest pain or shortness of breath.  Started on IV heparin last night.   Assessment/Plan:     1. Acute respiratory failure with hypoxia-likely due to pulmonary embolism, initially was not started on anticoagulation due to recent renal hemorrhage.  Admitting physician discussed with PCCM last night who recommended IVC filter placement.  Patient also started on vancomycin and cefepime for pneumonia, procalcitonin elevated to 4.02, lactic acid 2.75.  Pulmonology was consulted, no new recommendations.  IV heparin was started yesterday after discussion with urology.  2. Pulmonary embolism-seen on CTA chest, IVC filter was placed initially as patient recently had renal hemorrhage.  Patient continued to have symptoms of tachypnea requiring BiPAP, I called and discussed with urology who recommended to start IV heparin.  Patient does not want to be on Coumadin so he will probably will need NOAC at the time of discharge   3. Healthcare  associated pneumonia-concern for pneumonia infectious process.  Significant elevation of procalcitonin 4.02, lactic acid 2.75.  Started on vancomycin and cefepime.  Blood cultures x2 are negative.  Patient has received 6 days of IV antibiotics.  Will discontinue IV antibiotics on 02/01/2018 completing 7 days of treatment.  4. Chronic combined systolic and diastolic CHF-improving with hemodialysis chest x-ray showed mild edema, 2D echo from October 2018 showed EF 30 to 35% with grade 1 diastolic dysfunction.   5. ESRD on hemodialysis-patient gets similar to Friday, nephrology following , was dialysed yesterday.  Continue midodrine for hypotension  6. Recent history of hemorrhage of left kidney-CT scan of the abdomen showed unchanged appearance of moderate to large left renal and perinephric hematoma/hemorrhage descending into the pelvis.  Hemoglobin is stable at 8.8.  Called and discussed with urologist on-call Dr. Basilio Cairo, who reviewed the notes from urologist from previous admission.  Patient started on IV heparin as per recommendation from urology.  At the time of discharge on 01/23/2018 patient was supposed to stop anticoagulation for 7 days and restart around 01/30/2018.  7. CAD-stable  8. Atrial fibrillation- CHA2DS2VASc score is 5, patient is currently on IV heparin, will need NOAC at the time of discharge, heart rate controlled.  Continue amiodarone.  Telemetry monitoring.  9. Anemia-continue iron supplementation  10. Depression-continue mirtazapine  11. GERD-continue Protonix twice daily     CBG: Recent Labs  Lab 01/29/18 2037 01/30/18 0756 01/30/18 1412 01/30/18 2239 01/31/18 0057  GLUCAP 104* 80 91 151* 109*    CBC: Recent Labs  Lab 01/27/18 1856 01/28/18 0256 01/30/18 0733 01/30/18 1657 01/31/18 0436  WBC 12.1* 16.2* 14.7* 15.1* 13.7*  HGB 8.8*  8.3* 8.0* 8.9* 7.2*  HCT 27.8* 26.8* 25.6* 27.9* 23.4*  MCV 97.9 97.5 98.1 96.9 99.6  PLT 305 263 272 257 256     Basic Metabolic Panel: Recent Labs  Lab 01/27/18 1856 01/28/18 0256 01/30/18 0733 01/30/18 1657 01/31/18 0436  NA 132* 133* 136 135 136  K 5.3* 5.0 5.3* 4.2 4.8  CL 92* 92* 94* 94* 96*  CO2 25 27 25 26 27   GLUCOSE 81 93 84 100* 98  BUN 45* 30* 77* 29* 38*  CREATININE 9.10* 7.59* 13.13* 6.79* 8.22*  CALCIUM 9.5 9.5 10.2 9.5 9.8  PHOS 5.9*  --  8.1* 3.6  --      DVT prophylaxis: No DVT prophylaxis as venous duplex of lower extremities pending to rule out DVT, cannot use heparin or Lovenox due to recent renal hemorrhage  Code Status: Full code  Family Communication: No family at bedside  Disposition Plan: likely home when medically ready for discharge   Consultants:  None  Procedures:  None   Antibiotics:   Anti-infectives (From admission, onward)   Start     Dose/Rate Route Frequency Ordered Stop   01/30/18 1029  vancomycin (VANCOCIN) 1-5 GM/200ML-% IVPB    Note to Pharmacy:  Almira Bar   : cabinet override      01/30/18 1029 01/30/18 1144   01/27/18 2000  ceFEPIme (MAXIPIME) 1 g in sodium chloride 0.9 % 100 mL IVPB     1 g 200 mL/hr over 30 Minutes Intravenous Every 24 hours 01/26/18 1942     01/27/18 2000  vancomycin (VANCOCIN) IVPB 1000 mg/200 mL premix     1,000 mg 200 mL/hr over 60 Minutes Intravenous Every M-W-F (Hemodialysis) 01/27/18 1707     01/27/18 1200  vancomycin (VANCOCIN) IVPB 1000 mg/200 mL premix  Status:  Discontinued     1,000 mg 200 mL/hr over 60 Minutes Intravenous Every M-W-F (Hemodialysis) 01/26/18 1942 01/27/18 1707   01/26/18 2000  vancomycin (VANCOCIN) 2,000 mg in sodium chloride 0.9 % 500 mL IVPB     2,000 mg 250 mL/hr over 120 Minutes Intravenous  Once 01/26/18 1931 01/26/18 2121   01/26/18 1930  vancomycin (VANCOCIN) IVPB 1000 mg/200 mL premix  Status:  Discontinued     1,000 mg 200 mL/hr over 60 Minutes Intravenous  Once 01/26/18 1923 01/26/18 1931   01/26/18 1930  ceFEPIme (MAXIPIME) 2 g in sodium chloride 0.9 % 100  mL IVPB     2 g 200 mL/hr over 30 Minutes Intravenous  Once 01/26/18 1923 01/26/18 2122       Objective   Vitals:   01/31/18 0013 01/31/18 0126 01/31/18 0456 01/31/18 0500  BP: (!) 105/58  110/68   Pulse: 88 95 85 87  Resp: 16 (!) 32 (!) 22 16  Temp: 97.9 F (36.6 C)  98.1 F (36.7 C)   TempSrc: Oral  Axillary   SpO2: 100% 99% 100% 100%  Weight:      Height:        Intake/Output Summary (Last 24 hours) at 01/31/2018 1011 Last data filed at 01/31/2018 0528 Gross per 24 hour  Intake 466.34 ml  Output 2173 ml  Net -1706.66 ml   Filed Weights   01/30/18 0539 01/30/18 0830 01/30/18 1254  Weight: 110.9 kg 110.7 kg 108.3 kg     Physical Examination:   Eyes: No icterus, extraocular muscles intact  Mouth: Oral mucosa is moist, no lesions on palate,  Neck: Supple, no deformities, masses, or tenderness Lungs: Normal respiratory  effort, decreased breath sounds bilaterally Heart: Regular rate and rhythm, S1 and S2 normal, no murmurs, rubs auscultated Abdomen: BS normoactive,soft,nondistended,non-tender to palpation,no organomegaly Extremities: No pretibial edema, no erythema, no cyanosis, no clubbing Neuro : Alert and oriented to time, place and person, No focal deficits        Data Reviewed: I have personally reviewed following labs and imaging studies   Recent Results (from the past 240 hour(s))  Blood Culture (routine x 2)     Status: None   Collection Time: 01/26/18  8:45 PM  Result Value Ref Range Status   Specimen Description BLOOD RIGHT ANTECUBITAL  Final   Special Requests   Final    BOTTLES DRAWN AEROBIC AND ANAEROBIC Blood Culture adequate volume   Culture   Final    NO GROWTH 5 DAYS Performed at [facility], [address] [place], [place] [address]    Report Status 01/31/2018 FINAL  Final  Blood Culture (routine x 2)     Status: None   Collection Time: 01/26/18  8:45 PM  Result Value Ref Range Status   Specimen Description BLOOD RIGHT HAND   Final   Special Requests   Final    BOTTLES DRAWN AEROBIC ONLY Blood Culture results may not be optimal due to an inadequate volume of blood received in culture bottles   Culture   Final    NO GROWTH 5 DAYS Performed at [facility], [address] [place], [place] [address]    Report Status 01/31/2018 FINAL  Final     Liver Function Tests: Recent Labs  Lab 01/27/18 1856 01/28/18 0256 01/30/18 0733 01/30/18 1657 01/31/18 0436  AST  --  33  --   --  44*  ALT  --  32  --   --  25  ALKPHOS  --  90  --   --  93  BILITOT  --  1.1  --   --  1.1  PROT  --  7.4  --   --  8.2*  ALBUMIN 2.6* 2.5* 2.3* 2.3* 2.3*   No results for input(s): LIPASE, AMYLASE in the last 168 hours. No results for input(s): AMMONIA in the last 168 hours.  Cardiac Enzymes: No results for input(s): CKTOTAL, CKMB, CKMBINDEX, TROPONINI in the last 168 hours. BNP (last 3 results) Recent Labs    07/18/17 0531  BNP 1,325.2*    ProBNP (last 3 results) No results for input(s): PROBNP in the last 8760 hours.    Studies: No results found.  Scheduled Meds: . allopurinol  150 mg Oral QODAY  . amiodarone  100 mg Oral Daily  . chlorhexidine  15 mL Mouth Rinse BID  . Chlorhexidine Gluconate Cloth  6 each Topical Q0600  . darbepoetin (ARANESP) injection - DIALYSIS  200 mcg Intravenous Q Mon-HD  . feeding supplement (PRO-STAT SUGAR FREE 64)  30 mL Oral BID  . ferric citrate  630 mg Oral TID WC  . mouth rinse  15 mL Mouth Rinse q12n4p  . midodrine  5 mg Oral TID WC  . mirtazapine  7.5 mg Oral QHS  . multivitamin  1 tablet Oral QHS  . pantoprazole  40 mg Oral BID      Time spent: 25 min  [facility] Pager [phone]. If 7PM-7AM, please contact night-coverage at [email or web address], Office  [phone]  password [email or web address]  01/31/2018, 10:11 AM  LOS: 5 days

## 2018-01-31 NOTE — Progress Notes (Signed)
ANTICOAGULATION CONSULT NOTE - Follow Up Consult  Pharmacy Consult for heparin Indication: pulmonary embolus  Labs: Recent Labs    01/30/18 0733 01/30/18 1657 01/31/18 0436  HGB 8.0* 8.9* 7.2*  HCT 25.6* 27.9* 23.4*  PLT 272 257 256  HEPARINUNFRC  --   --  0.18*  CREATININE 13.13* 6.79*  --     Assessment: 56yo male subtherapeutic on heparin with initial conservative dosing for PE in pt w/ high risk of bleeding; Hgb is down but RN notes no overt signs of bleeding.  Goal of Therapy:  Heparin level 0.3-0.5 units/ml   Plan:  Will increase heparin gtt by 2 units/kg/hr to 1400 units/hr and check level in 8 hours.    Wynona Neat, PharmD, BCPS  01/31/2018,5:27 AM

## 2018-01-31 NOTE — Progress Notes (Addendum)
ANTICOAGULATION CONSULT NOTE - Follow Up Consult  Pharmacy Consult for heparin Indication: pulmonary embolus  Labs: Recent Labs    01/30/18 0733 01/30/18 1657 01/31/18 0436 01/31/18 1327  HGB 8.0* 8.9* 7.2*  --   HCT 25.6* 27.9* 23.4*  --   PLT 272 257 256  --   HEPARINUNFRC  --   --  0.18* 0.27*  CREATININE 13.13* 6.79* 8.22*  --     Assessment: 57yo male continues on heparin for PE in pt w/ high risk of bleeding; Hgb is down but RN notes no overt signs of bleeding.  Goal of Therapy:  Heparin level 0.3-0.5 units/ml   Plan:  Increase heparin slightly to 1500 units / hr Daily heparin level, CBC    Thank you Anette Guarneri, PharmD [phone] 01/31/2018,1:58 PM

## 2018-01-31 NOTE — Progress Notes (Signed)
Subjective: Interval History: has no complaint,breathing, a ltttle easier. Objective: Vital signs in last 24 hours: Temp:  [97.9 F (36.6 C)-101.3 F (38.5 C)] 98.1 F (36.7 C) (09/10 0456) Pulse Rate:  [82-116] 87 (09/10 0500) Resp:  [16-39] 16 (09/10 0500) BP: (101-140)/(45-99) 110/68 (09/10 0456) SpO2:  [96 %-100 %] 100 % (09/10 0500) FiO2 (%):  [50 %] 50 % (09/09 1733) Weight:  [108.3 kg] 108.3 kg (09/09 1254) Weight change: -0.204 kg  Intake/Output from previous day: 09/09 0701 - 09/10 0700 In: 466.3 [P.O.:250; I.V.:116.3; IV Piggyback:100] Out: 2173  Intake/Output this shift: No intake/output data recorded.  General appearance: alert, cooperative, moderately obese and slowed mentation Resp: diminished breath sounds bilaterally Cardio: S1, S2 normal and systolic murmur: systolic ejection 2/6, crescendo and decrescendo at 2nd left intercostal space GI: obese, pos bs, soft Extremities: edema Tr and aVF L UA  Lab Results: Recent Labs    01/30/18 1657 01/31/18 0436  WBC 15.1* 13.7*  HGB 8.9* 7.2*  HCT 27.9* 23.4*  PLT 257 256   BMET:  Recent Labs    01/30/18 1657 01/31/18 0436  NA 135 136  K 4.2 4.8  CL 94* 96*  CO2 26 27  GLUCOSE 100* 98  BUN 29* 38*  CREATININE 6.79* 8.22*  CALCIUM 9.5 9.8   No results for input(s): PTH in the last 72 hours. Iron Studies: No results for input(s): IRON, TIBC, TRANSFERRIN, FERRITIN in the last 72 hours.  Studies/Results: No results found.  I have reviewed the patient's current medications.  Assessment/Plan: 1 ESRD for HD in am 2 PE on hep but Hb lower 3 RP bleed lower HB 4 Anemia on ESA 5 DM controlled 6 DVT 7 obesity P HD, follow Hb     LOS: 5 days   [name] 01/31/2018,10:18 AM

## 2018-01-31 NOTE — Care Management Important Message (Signed)
Important Message  Patient Details  Name: [name] MRN: [id number] Date of Birth: [birth year]   Medicare Important Message Given:  Yes    Barb Merino M[place]ton 01/31/2018, 4:25 PM

## 2018-02-01 ENCOUNTER — Inpatient Hospital Stay (HOSPITAL_COMMUNITY): Payer: Medicare Other

## 2018-02-01 ENCOUNTER — Encounter (HOSPITAL_COMMUNITY): Payer: Self-pay | Admitting: Radiology

## 2018-02-01 DIAGNOSIS — F329 Major depressive disorder, single episode, unspecified: Secondary | ICD-10-CM

## 2018-02-01 DIAGNOSIS — M1A9XX0 Chronic gout, unspecified, without tophus (tophi): Secondary | ICD-10-CM

## 2018-02-01 DIAGNOSIS — D631 Anemia in chronic kidney disease: Secondary | ICD-10-CM

## 2018-02-01 LAB — CBC
HCT: 25.4 % — ABNORMAL LOW (ref 39.0–52.0)
Hemoglobin: 7.9 g/dL — ABNORMAL LOW (ref 13.0–17.0)
MCH: 30.5 pg (ref 26.0–34.0)
MCHC: 31.1 g/dL (ref 30.0–36.0)
MCV: 98.1 fL (ref 78.0–100.0)
Platelets: 311 10*3/uL (ref 150–400)
RBC: 2.59 MIL/uL — ABNORMAL LOW (ref 4.22–5.81)
RDW: 16.2 % — ABNORMAL HIGH (ref 11.5–15.5)
WBC: 11.1 10*3/uL — ABNORMAL HIGH (ref 4.0–10.5)

## 2018-02-01 LAB — RENAL FUNCTION PANEL
Albumin: 2.1 g/dL — ABNORMAL LOW (ref 3.5–5.0)
Anion gap: 19 — ABNORMAL HIGH (ref 5–15)
BUN: 62 mg/dL — ABNORMAL HIGH (ref 6–20)
CO2: 23 mmol/L (ref 22–32)
Calcium: 10.6 mg/dL — ABNORMAL HIGH (ref 8.9–10.3)
Chloride: 93 mmol/L — ABNORMAL LOW (ref 98–111)
Creatinine, Ser: 10.79 mg/dL — ABNORMAL HIGH (ref 0.61–1.24)
GFR calc Af Amer: 5 mL/min — ABNORMAL LOW (ref >60)
GFR calc non Af Amer: 5 mL/min — ABNORMAL LOW (ref >60)
Glucose, Bld: 77 mg/dL (ref 70–99)
Phosphorus: 5.9 mg/dL — ABNORMAL HIGH (ref 2.5–4.6)
Potassium: 4.4 mmol/L (ref 3.5–5.1)
Sodium: 135 mmol/L (ref 135–145)

## 2018-02-01 LAB — GLUCOSE, CAPILLARY: Glucose-Capillary: 97 mg/dL (ref 70–99)

## 2018-02-01 LAB — HEPARIN LEVEL (UNFRACTIONATED)
Heparin Unfractionated: 0.17 IU/mL — ABNORMAL LOW (ref 0.30–0.70)
Heparin Unfractionated: 0.34 IU/mL (ref 0.30–0.70)

## 2018-02-01 MED ORDER — HEPARIN (PORCINE) IN NACL 100-0.45 UNIT/ML-% IJ SOLN
1730.0000 [IU]/h | INTRAMUSCULAR | Status: DC
  Administered 2018-02-01: 1750 [IU]/h via INTRAVENOUS
  Administered 2018-02-01: 1700 [IU]/h via INTRAVENOUS
  Administered 2018-02-02 – 2018-02-11 (×13): 1750 [IU]/h via INTRAVENOUS
  Filled 2018-02-01 (×17): qty 250

## 2018-02-01 MED ORDER — VANCOMYCIN HCL IN DEXTROSE 1-5 GM/200ML-% IV SOLN
INTRAVENOUS | Status: AC
  Filled 2018-02-01: qty 200

## 2018-02-01 NOTE — Procedures (Signed)
I was present at this session.  I have reviewed the session itself and made appropriate changes.  HD via LUA avf, access press ok . bp low 100s.  tol well.  Jeneen Rinks Zaya Kessenich 9/11/20199:15 AM

## 2018-02-01 NOTE — Progress Notes (Signed)
ANTICOAGULATION CONSULT NOTE - Follow Up Consult  Pharmacy Consult for heparin Indication: pulmonary embolus  Labs: Recent Labs    01/30/18 0733 01/30/18 1657 01/31/18 0436 01/31/18 1327 02/01/18 0337  HGB 8.0* 8.9* 7.2*  --  7.9*  HCT 25.6* 27.9* 23.4*  --  25.4*  PLT 272 257 256  --  311  HEPARINUNFRC  --   --  0.18* 0.27* 0.17*  CREATININE 13.13* 6.79* 8.22*  --   --     Assessment: 57yo male continues on heparin for PE in pt w/ high risk of bleeding; Hgb is 7.9, low but increased slightly from 7.2 yesterday.  No overt signs of bleeding.  Heparin level dropped to 0.17, subtherapeutic on heparin 1500 units/hr.  No interruption or complications with the heparin infusion and no bleeding noted per RN's report.   Goal of Therapy:  Heparin level 0.3-0.5 units/ml   Plan:  Increase heparin slightly to 1700 units / hr Check 6 hour heparin level.  Daily heparin level, CBC    Thank you Nicole Cella, RPh Clinical Pharmacist Please check AMION for all [facility] phone numbers After 10:00 PM, call [place] [phone] 02/01/2018,4:43 AM

## 2018-02-01 NOTE — Progress Notes (Signed)
Subjective: Interval History: has complaints abdm still sore..  Objective: Vital signs in last 24 hours: Temp:  [98.2 F (36.8 C)-98.6 F (37 C)] 98.6 F (37 C) (09/11 0840) Pulse Rate:  [85-95] 86 (09/11 0857) Resp:  [17-22] 17 (09/11 0857) BP: (100-134)/(54-71) 131/65 (09/11 0857) SpO2:  [96 %-100 %] 100 % (09/11 0857) Weight:  [109.5 kg-112.6 kg] 109.5 kg (09/11 0840) Weight change: 1.9 kg  Intake/Output from previous day: 09/10 0701 - 09/11 0700 In: 426.2 [I.V.:326.2; IV Piggyback:100] Out: -  Intake/Output this shift: No intake/output data recorded.  General appearance: alert, no distress, moderately obese and pale Resp: diminished breath sounds bilaterally Cardio: S1, S2 normal and systolic murmur: holosystolic 2/6, blowing at apex GI: pos bs, soft, liver down 5 cm, mild diffuse tenderness. Extremities: edema 1+  Lab Results: Recent Labs    01/31/18 0436 02/01/18 0337  WBC 13.7* 11.1*  HGB 7.2* 7.9*  HCT 23.4* 25.4*  PLT 256 311   BMET:  Recent Labs    01/31/18 0436 02/01/18 0733  NA 136 135  K 4.8 4.4  CL 96* 93*  CO2 27 23  GLUCOSE 98 77  BUN 38* 62*  CREATININE 8.22* 10.79*  CALCIUM 9.8 10.6*   No results for input(s): PTH in the last 72 hours. Iron Studies: No results for input(s): IRON, TIBC, TRANSFERRIN, FERRITIN in the last 72 hours.  Studies/Results: No results found.  I have reviewed the patient's current medications.  Assessment/Plan: 1 ESRD for HD. Some xs vol 2 DVT s/p filter 3 Obesity 4 PE On hep again 5 RP bleed 6 Anemia stable P HD, follow Hb, anticoag    LOS: 6 days   Jeneen Rinks Jennings Stirling 02/01/2018,9:10 AM

## 2018-02-01 NOTE — Progress Notes (Signed)
ANTICOAGULATION CONSULT NOTE - Follow Up Consult  Pharmacy Consult for heparin Indication: pulmonary embolus  Labs: Recent Labs    01/30/18 1657  01/31/18 0436 01/31/18 1327 02/01/18 0337 02/01/18 0733 02/01/18 1446  HGB 8.9*  --  7.2*  --  7.9*  --   --   HCT 27.9*  --  23.4*  --  25.4*  --   --   PLT 257  --  256  --  311  --   --   HEPARINUNFRC  --    < > 0.18* 0.27* 0.17*  --  0.34  CREATININE 6.79*  --  8.22*  --   --  10.79*  --    < > = values in this interval not displayed.    Assessment: 57yo male continues on heparin for PE in pt w/ high risk of bleeding; Hgb is 7.9, low but increased slightly from 7.2 yesterday.   Heparin level came back within therapeutic range at 0.34, on 1700 units/hr. No s/sx of bleeding per nursing. No infusion issues.    Goal of Therapy:  Heparin level 0.3-0.5 units/ml   Plan:  Increase heparin slightly to 1750 units / hr Check 6 hour heparin level.  Daily heparin level, CBC    Thank you Doylene Canard, PharmD Clinical Pharmacist  Pager: [phone] Phone: [phone] Please check AMION for all [place] phone numbers After 10:00 PM, call [place] [phone] 02/01/2018,4:33 PM

## 2018-02-01 NOTE — Progress Notes (Signed)
PROGRESS NOTE    [name]  NTI:[id number] DOB: 10/08/1960 DOA: 01/26/2018 PCP: Iona Beard, MD     Brief Narrative:  57 year old male with a history of ESRD on hemodialysis Monday Wednesday Friday, hypertension, CVA, GERD, gout, atrial fibrillation not on anticoagulation, systolic CHF with EF 86%, CAD, status post left nephrectomy for spontaneous hemorrhage in the left kidney in the setting of supratherapeutic INR.  Came to hospital with worsening shortness of breath. CTA showed possibility of pulmonary embolism, pneumonia and also not excluded.  On IV antibiotics.  Chest x-ray also showed mild interstitial edema.  Patient continued on IV antibiotics, underwent hemodialysis.  Continues to have intermittent episodes of tachypnea and shortness of breath requiring BiPAP.  Started on IV heparin after discussion with urology.   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  7-depression -continue remeron  8-anemia of chronic kidney disease: -continue IV epogen as per renal  rec's -continue also ferric citrate   9-GERD -continue PPI.  10-HCAP -no fever and normal WBC's -patient last dose of antibiotics is today  -Follow clinical response.   DVT prophylaxis: receiving heparin drip Code Status: Full  Family Communication: no family at bedside  Disposition Plan: remains inpatient, continue HD as per renal service rec's; QHS CPAP usage encourage. Continue heparin for now. PT to assess and provide recommendations, patient is weak and deconditioned   Consultants:   Nephrology   IR  Procedures:   See below for x-ray reports.   Antimicrobials:  Anti-infectives (From admission, onward)   Start     Dose/Rate Route Frequency Ordered Stop   02/01/18 1023  vancomycin (VANCOCIN) 1-5 GM/200ML-% IVPB    Note to Pharmacy:  Mellody Dance, [place]   : cabinet override      02/01/18 1023 02/01/18 2229   01/30/18 1029  vancomycin (VANCOCIN) 1-5 GM/200ML-% IVPB    Note to Pharmacy:  Almira Bar   : cabinet override      01/30/18 1029 01/30/18 1144   01/27/18 2000  ceFEPIme (MAXIPIME) 1 g in sodium chloride 0.9 % 100 mL IVPB     1 g 200 mL/hr over 30 Minutes Intravenous Every 24 hours 01/26/18 1942 02/01/18 2359   01/27/18 2000  vancomycin (VANCOCIN) IVPB 1000 mg/200 mL premix     1,000 mg 200 mL/hr over 60 Minutes Intravenous Every M-W-F (Hemodialysis) 01/27/18 1707 02/01/18 1252   01/27/18 1200  vancomycin (VANCOCIN) IVPB 1000 mg/200 mL premix  Status:  Discontinued     1,000 mg 200 mL/hr over 60 Minutes Intravenous Every M-W-F (Hemodialysis) 01/26/18 1942 01/27/18 1707   01/26/18 2000  vancomycin (VANCOCIN) 2,000 mg in sodium chloride 0.9 % 500 mL IVPB     2,000 mg 250 mL/hr over 120  Minutes Intravenous  Once 01/26/18 1931 01/26/18 2121   01/26/18 1930  vancomycin (VANCOCIN) IVPB 1000 mg/200 mL premix  Status:  Discontinued     1,000 mg 200 mL/hr over 60 Minutes Intravenous  Once 01/26/18 1923 01/26/18 1931   01/26/18 1930  ceFEPIme (MAXIPIME) 2 g in sodium  chloride 0.9 % 100 mL IVPB     2 g 200 mL/hr over 30 Minutes Intravenous  Once 01/26/18 1923 01/26/18 2122       Subjective: Slightly somnolent, no CP, no SOB, no nausea, no vomiting. Reports mild pain with deep inspiration.  Objective: Vitals:   02/01/18 1200 02/01/18 1230 02/01/18 1252 02/01/18 1303  BP: (!) 96/41 (!) 110/48 (!) 105/45 117/72  Pulse: 91 95 98 97  Resp: (!) 21 20 18    Temp:   98.1 F (36.7 C) 98.1 F (36.7 C)  TempSrc:   Oral Oral  SpO2: 100% 100% 100% 100%  Weight:   106.5 kg   Height:        Intake/Output Summary (Last 24 hours) at 02/01/2018 1504 Last data filed at 02/01/2018 1252 Gross per 24 hour  Intake 426.24 ml  Output 3000 ml  Net -2573.76 ml   Filed Weights   02/01/18 0422 02/01/18 0840 02/01/18 1252  Weight: 112.6 kg 109.5 kg 106.5 kg    Examination: General exam: Alert, awake, oriented x 3; slightly somnolent, but able to carry conversation and answer questions appropriately. Denies SOB, nausea and vomiting. Patient expressed mild chest discomfort with deep breaths. Respiratory system: fair air movement, no wheezing, no using accessory muscles. Decrease BS at bases bilaterally.  Cardiovascular system:RRR. Positive SEM, no rubs, no gallops. Gastrointestinal system: Abdomen is nondistended, soft and nontender. No organomegaly or masses felt. Normal bowel sounds heard. Central nervous system: Alert and oriented. No focal neurological deficits. Extremities: No cyanosis, no clubbing, trace edema bilaterally.  Skin: No rashes, no petechiae  Psychiatry: Judgement and insight appear normal. Mood & affect appropriate.    Data Reviewed: I have personally reviewed following labs and imaging studies  CBC: Recent Labs  Lab 01/28/18 0256 01/30/18 0733 01/30/18 1657 01/31/18 0436 02/01/18 0337  WBC 16.2* 14.7* 15.1* 13.7* 11.1*  HGB 8.3* 8.0* 8.9* 7.2* 7.9*  HCT 26.8* 25.6* 27.9* 23.4* 25.4*  MCV 97.5 98.1 96.9 99.6 98.1  PLT 263 272 257 256  983   Basic Metabolic Panel: Recent Labs  Lab 01/27/18 1856 01/28/18 0256 01/30/18 0733 01/30/18 1657 01/31/18 0436 02/01/18 0733  NA 132* 133* 136 135 136 135  K 5.3* 5.0 5.3* 4.2 4.8 4.4  CL 92* 92* 94* 94* 96* 93*  CO2 25 27 25 26 27 23   GLUCOSE 81 93 84 100* 98 77  BUN 45* 30* 77* 29* 38* 62*  CREATININE 9.10* 7.59* 13.13* 6.79* 8.22* 10.79*  CALCIUM 9.5 9.5 10.2 9.5 9.8 10.6*  PHOS 5.9*  --  8.1* 3.6  --  5.9*   Liver Function Tests: Recent Labs  Lab 01/28/18 0256 01/30/18 0733 01/30/18 1657 01/31/18 0436 02/01/18 0733  AST 33  --   --  44*  --   ALT 32  --   --  25  --   ALKPHOS 90  --   --  93  --   BILITOT 1.1  --   --  1.1  --   PROT 7.4  --   --  8.2*  --   ALBUMIN 2.5* 2.3* 2.3* 2.3* 2.1*   Coagulation Profile: Recent Labs  Lab  01/27/18 0222  INR 1.22   CBG: Recent Labs  Lab 01/29/18 2037 01/30/18 0756 01/30/18 1412 01/30/18 2239 01/31/18 0057  GLUCAP 104* 80 91 151* 109*    Urine analysis:    Component Value Date/Time   COLORURINE YELLOW 05/19/2011 1042   APPEARANCEUR CLEAR 05/19/2011 1042   LABSPEC 1.013 05/19/2011 1042   PHURINE 6.5 05/19/2011 1042   GLUCOSEU NEGATIVE 05/19/2011 1042   HGBUR TRACE (A) 05/19/2011 1042   BILIRUBINUR NEGATIVE 05/19/2011 1042   KETONESUR NEGATIVE 05/19/2011 1042   PROTEINUR >300 (A) 05/19/2011 1042   UROBILINOGEN 0.2 05/19/2011 1042   NITRITE NEGATIVE 05/19/2011 1042   LEUKOCYTESUR NEGATIVE 05/19/2011 1042    Recent Results (from the past 240 hour(s))  Blood Culture (routine x 2)     Status: None   Collection Time: 01/26/18  8:45 PM  Result Value Ref Range Status   Specimen Description BLOOD RIGHT ANTECUBITAL  Final   Special Requests   Final    BOTTLES DRAWN AEROBIC AND ANAEROBIC Blood Culture adequate volume   Culture   Final    NO GROWTH 5 DAYS Performed at [facility], [place] [address], [place], [place] [address]    Report Status 01/31/2018 FINAL  Final  Blood Culture (routine x 2)      Status: None   Collection Time: 01/26/18  8:45 PM  Result Value Ref Range Status   Specimen Description BLOOD RIGHT HAND  Final   Special Requests   Final    BOTTLES DRAWN AEROBIC ONLY Blood Culture results may not be optimal due to an inadequate volume of blood received in culture bottles   Culture   Final    NO GROWTH 5 DAYS Performed at [facility], [place] [address] [place], [place] [address]    Report Status 01/31/2018 FINAL  Final     Scheduled Meds: . allopurinol  150 mg Oral QODAY  . amiodarone  100 mg Oral Daily  . chlorhexidine  15 mL Mouth Rinse BID  . Chlorhexidine Gluconate Cloth  6 each Topical Q0600  . darbepoetin (ARANESP) injection - DIALYSIS  200 mcg Intravenous Q Mon-HD  . feeding supplement (PRO-STAT SUGAR FREE 64)  30 mL Oral BID  . ferric citrate  630 mg Oral TID WC  . mouth rinse  15 mL Mouth Rinse q12n4p  . midodrine  5 mg Oral TID WC  . mirtazapine  7.5 mg Oral QHS  . multivitamin  1 tablet Oral QHS  . pantoprazole  40 mg Oral BID   Continuous Infusions: . ceFEPime (MAXIPIME) IV Stopped (01/31/18 2224)  . heparin 1,700 Units/hr (02/01/18 0624)  . vancomycin       LOS: 6 days    Time spent: 30 minutes   Barton Dubois, MD [facility] Pager [phone]  If 7PM-7AM, please contact night-coverage [email or web address] Password [facility] 02/01/2018, 3:04 PM

## 2018-02-02 LAB — CBC
HCT: 27.9 % — ABNORMAL LOW (ref 39.0–52.0)
Hemoglobin: 8.5 g/dL — ABNORMAL LOW (ref 13.0–17.0)
MCH: 29.9 pg (ref 26.0–34.0)
MCHC: 30.5 g/dL (ref 30.0–36.0)
MCV: 98.2 fL (ref 78.0–100.0)
Platelets: 278 10*3/uL (ref 150–400)
RBC: 2.84 MIL/uL — ABNORMAL LOW (ref 4.22–5.81)
RDW: 16.3 % — ABNORMAL HIGH (ref 11.5–15.5)
WBC: 12.4 10*3/uL — ABNORMAL HIGH (ref 4.0–10.5)

## 2018-02-02 LAB — BLOOD GAS, ARTERIAL
Acid-Base Excess: 4.7 mmol/L — ABNORMAL HIGH (ref 0.0–2.0)
Bicarbonate: 28.6 mmol/L — ABNORMAL HIGH (ref 20.0–28.0)
Drawn by: 277551
O2 Content: 3.5 L/min
O2 Saturation: 97 %
Patient temperature: 98.6
pCO2 arterial: 41.3 mmHg (ref 32.0–48.0)
pH, Arterial: 7.454 — ABNORMAL HIGH (ref 7.350–7.450)
pO2, Arterial: 82.7 mmHg — ABNORMAL LOW (ref 83.0–108.0)

## 2018-02-02 LAB — HEPARIN LEVEL (UNFRACTIONATED): Heparin Unfractionated: 0.35 IU/mL (ref 0.30–0.70)

## 2018-02-02 MED ORDER — CINACALCET HCL 30 MG PO TABS
60.0000 mg | ORAL_TABLET | Freq: Every day | ORAL | Status: DC
  Administered 2018-02-02 – 2018-02-06 (×5): 60 mg via ORAL
  Filled 2018-02-02 (×5): qty 2

## 2018-02-02 NOTE — Progress Notes (Signed)
PROGRESS NOTE    [name]  TLX:[id number] DOB: [birth year] DOA: 01/26/2018 PCP: Iona Beard, MD     Brief Narrative:  57 year old male with a history of ESRD on hemodialysis Monday Wednesday Friday, hypertension, CVA, GERD, gout, atrial fibrillation not on anticoagulation, systolic CHF with EF 74%, CAD, status post left nephrectomy for spontaneous hemorrhage in the left kidney in the setting of supratherapeutic INR.  Came to hospital with worsening shortness of breath. CTA showed possibility of pulmonary embolism, pneumonia and also not excluded.  On IV antibiotics.  Chest x-ray also showed mild interstitial edema.  Patient continued on IV antibiotics, underwent hemodialysis.  Continues to have intermittent episodes of tachypnea and shortness of breath requiring BiPAP.  Started on IV heparin after discussion with urology.   Assessment & Plan: 1-acute resp failure with hypoxia: due to PE -continue weaning oxygen supplementation as tolerated -continue heparin drip, 48 hours after demonstration of stable Hgb will start coumadin transition; hopefully 9/13 -continue minimizing the use of sedatives agents -patient reported just mild chest discomfort with deep inspiration; otherwise in no acute distress.  -patient is status post IVC filter placement.  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 -encouraged and educated about importance of CPAP compliance -will  stop ambien and minimize the use of anxiolytics/sedatives meds as much as possible.  7-depression -continue remeron  8-anemia of chronic kidney disease: -continue IV epogen as per renal rec's -continue also ferric citrate  -Hgb 8.5 today  9-GERD -continue PPI.  10-HCAP -no fever and no cough currently. -patient has completed antibiotic therapy on 02/01/2018. -Follow clinical response.   DVT prophylaxis: receiving heparin drip Code Status: Full  Family Communication: no family at bedside  Disposition Plan: remains inpatient, continue HD as per renal service rec's; QHS CPAP usage encourage. Continue heparin for now. PT to assess and provide recommendations, patient is weak and deconditioned   Consultants:   Nephrology   IR  Procedures:   See below for x-ray reports.   Antimicrobials:  Anti-infectives (From admission, onward)   Start     Dose/Rate Route Frequency Ordered Stop   02/01/18 1023  vancomycin (VANCOCIN) 1-5 GM/200ML-% IVPB    Note to Pharmacy:  Mellody Dance, [place]   : cabinet override      02/01/18 1023 02/01/18 2229   01/30/18 1029  vancomycin (VANCOCIN) 1-5 GM/200ML-% IVPB    Note to Pharmacy:  Almira Bar   : cabinet override      01/30/18 1029 01/30/18 1144   01/27/18 2000  ceFEPIme (MAXIPIME) 1 g in sodium chloride 0.9 % 100 mL IVPB     1 g 200 mL/hr over 30 Minutes Intravenous Every 24 hours 01/26/18 1942 02/01/18 2121   01/27/18 2000  vancomycin (VANCOCIN) IVPB 1000 mg/200 mL premix     1,000 mg 200 mL/hr over 60 Minutes Intravenous Every M-W-F (Hemodialysis) 01/27/18 1707 02/01/18 1252   01/27/18 1200  vancomycin (VANCOCIN) IVPB 1000 mg/200 mL premix  Status:  Discontinued     1,000 mg 200 mL/hr over 60 Minutes Intravenous Every M-W-F (  Hemodialysis) 01/26/18 1942 01/27/18 1707   01/26/18 2000  vancomycin (VANCOCIN) 2,000 mg in sodium chloride 0.9 % 500 mL IVPB     2,000 mg 250 mL/hr over 120 Minutes Intravenous  Once 01/26/18 1931 01/26/18 2121    01/26/18 1930  vancomycin (VANCOCIN) IVPB 1000 mg/200 mL premix  Status:  Discontinued     1,000 mg 200 mL/hr over 60 Minutes Intravenous  Once 01/26/18 1923 01/26/18 1931   01/26/18 1930  ceFEPIme (MAXIPIME) 2 g in sodium chloride 0.9 % 100 mL IVPB     2 g 200 mL/hr over 30 Minutes Intravenous  Once 01/26/18 1923 01/26/18 2122       Subjective: More alert, denies chest pain, breathing continued to improve.  No nausea, no vomiting, no abdominal pain.  Objective: Vitals:   02/02/18 0612 02/02/18 0950 02/02/18 1236 02/02/18 1239  BP: (!) 106/53 (!) 123/101 (!) 159/138 107/60  Pulse: 88 98  91  Resp: 19 19  20   Temp:  98.8 F (37.1 C)  98.5 F (36.9 C)  TempSrc:  Oral Oral Oral  SpO2: 100% 100%  97%  Weight:      Height:        Intake/Output Summary (Last 24 hours) at 02/02/2018 1626 Last data filed at 02/02/2018 1300 Gross per 24 hour  Intake 645.96 ml  Output -  Net 645.96 ml   Filed Weights   02/01/18 0840 02/01/18 1252 02/02/18 0454  Weight: 109.5 kg 106.5 kg 109.9 kg    Examination: General exam: Alert, awake, oriented x 3; more alert today; denies chest pain, reports breathing is improving and is currently no having any nausea or vomiting. Respiratory system: Improved air movement bilaterally, no wheezing; no using accessory muscles.  Patient with decreased breath sounds at the bases.  Positive for scattered rhonchi.   Cardiovascular system:RRR. No rubs, no gallops, positive systolic ejection murmur.  Gastrointestinal system: Abdomen is nondistended, soft and nontender. No organomegaly or masses felt. Normal bowel sounds heard. Central nervous system: Alert and oriented. No focal neurological deficits. Extremities: Trace edema bilaterally, no cyanosis, no clubbing. Skin: No rashes, no petechiae. Psychiatry: Judgement and insight appear normal. Mood & affect appropriate.    Data Reviewed: I have personally reviewed following labs and imaging studies  CBC: Recent  Labs  Lab 01/30/18 0733 01/30/18 1657 01/31/18 0436 02/01/18 0337 02/02/18 0124  WBC 14.7* 15.1* 13.7* 11.1* 12.4*  HGB 8.0* 8.9* 7.2* 7.9* 8.5*  HCT 25.6* 27.9* 23.4* 25.4* 27.9*  MCV 98.1 96.9 99.6 98.1 98.2  PLT 272 257 256 311 254   Basic Metabolic Panel: Recent Labs  Lab 01/27/18 1856 01/28/18 0256 01/30/18 0733 01/30/18 1657 01/31/18 0436 02/01/18 0733  NA 132* 133* 136 135 136 135  K 5.3* 5.0 5.3* 4.2 4.8 4.4  CL 92* 92* 94* 94* 96* 93*  CO2 25 27 25 26 27 23   GLUCOSE 81 93 84 100* 98 77  BUN 45* 30* 77* 29* 38* 62*  CREATININE 9.10* 7.59* 13.13* 6.79* 8.22* 10.79*  CALCIUM 9.5 9.5 10.2 9.5 9.8 10.6*  PHOS 5.9*  --  8.1* 3.6  --  5.9*   Liver Function Tests: Recent Labs  Lab 01/28/18 0256 01/30/18 0733 01/30/18 1657 01/31/18 0436 02/01/18 0733  AST 33  --   --  44*  --   ALT 32  --   --  25  --   ALKPHOS 90  --   --  93  --   BILITOT 1.1  --   --  1.1  --   PROT 7.4  --   --  8.2*  --   ALBUMIN 2.5* 2.3* 2.3* 2.3* 2.1*   Coagulation Profile: Recent Labs  Lab 01/27/18 0222  INR 1.22   CBG: Recent Labs  Lab 01/30/18 0756 01/30/18 1412 01/30/18 2239 01/31/18 0057 02/01/18 2044  GLUCAP 80 91 151* 109* 97    Urine analysis:    Component Value Date/Time   COLORURINE YELLOW 05/19/2011 1042   APPEARANCEUR CLEAR 05/19/2011 1042   LABSPEC 1.013 05/19/2011 1042   PHURINE 6.5 05/19/2011 1042   GLUCOSEU NEGATIVE 05/19/2011 1042   HGBUR TRACE (A) 05/19/2011 1042   BILIRUBINUR NEGATIVE 05/19/2011 1042   KETONESUR NEGATIVE 05/19/2011 1042   PROTEINUR >300 (A) 05/19/2011 1042   UROBILINOGEN 0.2 05/19/2011 1042   NITRITE NEGATIVE 05/19/2011 1042   LEUKOCYTESUR NEGATIVE 05/19/2011 1042    Recent Results (from the past 240 hour(s))  Blood Culture (routine x 2)     Status: None   Collection Time: 01/26/18  8:45 PM  Result Value Ref Range Status   Specimen Description BLOOD RIGHT ANTECUBITAL  Final   Special Requests   Final    BOTTLES DRAWN  AEROBIC AND ANAEROBIC Blood Culture adequate volume   Culture   Final    NO GROWTH 5 DAYS Performed at [facility], [place] [address], [place], [place] [address]    Report Status 01/31/2018 FINAL  Final  Blood Culture (routine x 2)     Status: None   Collection Time: 01/26/18  8:45 PM  Result Value Ref Range Status   Specimen Description BLOOD RIGHT HAND  Final   Special Requests   Final    BOTTLES DRAWN AEROBIC ONLY Blood Culture results may not be optimal due to an inadequate volume of blood received in culture bottles   Culture   Final    NO GROWTH 5 DAYS Performed at [facility], [place] [address] [place], [place] [address]    Report Status 01/31/2018 FINAL  Final     Scheduled Meds: . allopurinol  150 mg Oral QODAY  . amiodarone  100 mg Oral Daily  . chlorhexidine  15 mL Mouth Rinse BID  . Chlorhexidine Gluconate Cloth  6 each Topical Q0600  . cinacalcet  60 mg Oral Q supper  . darbepoetin (ARANESP) injection - DIALYSIS  200 mcg Intravenous Q Mon-HD  . feeding supplement (PRO-STAT SUGAR FREE 64)  30 mL Oral BID  . ferric citrate  630 mg Oral TID WC  . mouth rinse  15 mL Mouth Rinse q12n4p  . midodrine  5 mg Oral TID WC  . mirtazapine  7.5 mg Oral QHS  . multivitamin  1 tablet Oral QHS  . pantoprazole  40 mg Oral BID   Continuous Infusions: . heparin 1,750 Units/hr (02/02/18 1321)     LOS: 7 days    Time spent: 30 minutes   Barton Dubois, MD [facility] Pager [phone]  If 7PM-7AM, please contact night-coverage [email or web address] Password [facility] 02/02/2018, 4:26 PM

## 2018-02-02 NOTE — Significant Event (Signed)
Rapid Response Event Note  Overview: Time Called: 2026 Arrival Time: 2028 Event Type: Neurologic - AMS  Initial Focused Assessment: Called by RN about patient having AMS. Per RN this is not an acute change but patient's wife says that since returning from HD on Monday, patient has not been the same. Per wife, patient has been confused, forgetful, delayed in responding, having tremor like movement as well, and weakness.   Upon my arrival, patient was alert, oriented x 2, perservates at times, he has generalized in all extremities, no facial asymmetry, no aphasia, no dysarthria, LSN unknown and NIH was 10. VSS. Blood sugar was normal. Heparin drip infusing. TR[facility] NP was paged. ABG from earlier in the evening was normal.  Interventions: - HEAD CT - no new acute hemorrhage or acute infarct.   Plan of Care: - Delirium Precautions - Minimize sedating medications - PT consult - encourage getting out of bed, mobilize patient as patient tolerates.   Event Summary: Name of Physician Notified: Schorr, NP at 2110  Outcome: Stayed in room and stabalized  Event End Time: [place], [place]

## 2018-02-02 NOTE — Progress Notes (Addendum)
[facility] Progress Note   Subjective:  Seen in room, breathing easier. Denies chest or abd pain. Still very slow in speech compared to baseline. Having BUE tremors as well. Head CT 9/11 negative.   Objective Vitals:   02/01/18 2356 02/02/18 0454 02/02/18 0612 02/02/18 0950  BP: 113/64 92/75 (!) 106/53 (!) 123/101  Pulse: 96 91 88 98  Resp: (!) 22 (!) 21 19 19   Temp: 98.5 F (36.9 C) 98.3 F (36.8 C)  98.8 F (37.1 C)  TempSrc: Oral Oral  Oral  SpO2: 100% 100% 100% 100%  Weight:  109.9 kg    Height:       Physical Exam General: Awake/alert but slow in speech. Mild ?tremor to B hands with movements. Heart: RRR; 2/6 systolic murmur Lungs: CTA anteriorly decreased bs Abdomen: soft, non-tender obese, pos bs, liver down 5 cm Extremities: Trace B LE edema Dialysis Access: AVF + bruit  Additional Objective Labs: Basic Metabolic Panel: Recent Labs  Lab 01/30/18 0733 01/30/18 1657 01/31/18 0436 02/01/18 0733  NA 136 135 136 135  K 5.3* 4.2 4.8 4.4  CL 94* 94* 96* 93*  CO2 25 26 27 23   GLUCOSE 84 100* 98 77  BUN 77* 29* 38* 62*  CREATININE 13.13* 6.79* 8.22* 10.79*  CALCIUM 10.2 9.5 9.8 10.6*  PHOS 8.1* 3.6  --  5.9*   Liver Function Tests: Recent Labs  Lab 01/28/18 0256  01/30/18 1657 01/31/18 0436 02/01/18 0733  AST 33  --   --  44*  --   ALT 32  --   --  25  --   ALKPHOS 90  --   --  93  --   BILITOT 1.1  --   --  1.1  --   PROT 7.4  --   --  8.2*  --   ALBUMIN 2.5*   < > 2.3* 2.3* 2.1*   < > = values in this interval not displayed.   CBC: Recent Labs  Lab 01/30/18 0733 01/30/18 1657 01/31/18 0436 02/01/18 0337 02/02/18 0124  WBC 14.7* 15.1* 13.7* 11.1* 12.4*  HGB 8.0* 8.9* 7.2* 7.9* 8.5*  HCT 25.6* 27.9* 23.4* 25.4* 27.9*  MCV 98.1 96.9 99.6 98.1 98.2  PLT 272 257 256 311 278   CBG: Recent Labs  Lab 01/30/18 0756 01/30/18 1412 01/30/18 2239 01/31/18 0057 02/01/18 2044  GLUCAP 80 91 151* 109* 97   Studies/Results: Ct  Head Wo Contrast  Result Date: 02/01/2018 CLINICAL DATA:  57 y/o M; altered mental status. History of stroke, hypertension, diabetes. EXAM: CT HEAD WITHOUT CONTRAST TECHNIQUE: Contiguous axial images were obtained from the base of the skull through the vertex without intravenous contrast. COMPARISON:  03/16/2017 CT head and MRI head. FINDINGS: Brain: No evidence of acute infarction, hemorrhage, hydrocephalus, extra-axial collection or mass lesion/mass effect. Stable mild chronic microvascular ischemic changes and volume loss of the brain. Stable small chronic infarct within right hemi pons and right thalamus. Vascular: Calcific atherosclerosis of carotid siphons. No hyperdense vessel identified. Skull: Normal. Negative for fracture or focal lesion. Sinuses/Orbits: No acute finding. Other: None. IMPRESSION: 1. No acute intracranial abnormality identified. 2. Stable chronic microvascular ischemic changes, volume loss of the brain, as well as small chronic infarcts within the right thalamus and pons. Electronically Signed   By: Kristine Garbe M.D.   On: 02/01/2018 22:15   Medications: . heparin 1,750 Units/hr (02/01/18 2141)   . allopurinol  150 mg Oral QODAY  . amiodarone  100  mg Oral Daily  . chlorhexidine  15 mL Mouth Rinse BID  . Chlorhexidine Gluconate Cloth  6 each Topical Q0600  . darbepoetin (ARANESP) injection - DIALYSIS  200 mcg Intravenous Q Mon-HD  . feeding supplement (PRO-STAT SUGAR FREE 64)  30 mL Oral BID  . ferric citrate  630 mg Oral TID WC  . mouth rinse  15 mL Mouth Rinse q12n4p  . midodrine  5 mg Oral TID WC  . mirtazapine  7.5 mg Oral QHS  . multivitamin  1 tablet Oral QHS  . pantoprazole  40 mg Oral BID   Dialysis Orders: MWF at [facility] 4hr, 450/800, EDW 111.5kg, 2K/2.25Ca, #4/linear Na, AVF, Hep 4500 + 1500 - Hectoral 36mcg IV q HD - Parsabiv 12.5mg  IV q HD - Mircera 77mcg q 2 weeks  Assessment/Plan: 1.  R Pulmonary Embolii: S/p IVC filter d/t PE & L DVT.  Started on IV heparin after cleared by urology (prior RP bleed) - now transitioning to warfarin. 2.  ESRD: Usually MWF schedule, next 9/13. 3.  Hypertension/volume: BP decent, now below EDW - continue to challenge as tolerated. 4.  Anemia: Hgb 8.5 - on Aranesp 233mcg q Monday. Follow. 5.  Metabolic bone disease: Ca and Phos high - d/c'd Phoslo and Hectoral on admit. On Auryxia as binder. Corr Ca 12.1 -- very high, wondering if this is the cause of his AMS/tremors. Will check PTH and start PO sensipar 60mg  QD today. Consider calcitonin if Ca not dropping quickly. Use low Ca bath 6.  Nutrition: Alb low, continue pro-stat supplements. 7.  L renal hemorrhage/hematoma: Stable on Abd CT. 8.  Hx a-flutter: On amiodarone and heparin drip. 9.  NICM (EF 30-35%)  Veneta Penton, PA-C 02/02/2018, 10:00 AM  [facility] Pager: [phone] I have seen and examined this patient and agree with the plan of care seen, examined,eval, changes made. Jeneen Rinks Koltin Wehmeyer 02/02/2018, 1:01 PM

## 2018-02-02 NOTE — Progress Notes (Signed)
VAST RN to pt's room to start new PIV. After further inquiring and investigation, noted patient's abx completed. Inquired with unit RN; no extra IV needed at this time. Advised to place new IVT consult in future if circumstances change.

## 2018-02-02 NOTE — Progress Notes (Signed)
ANTICOAGULATION CONSULT NOTE - Follow Up Consult  Pharmacy Consult for Heparin Indication: pulmonary embolus  Allergies  Allergen Reactions  . Ace Inhibitors Itching and Cough    Patient Measurements: Height: 6\' 1"  (185.4 cm) Weight: 242 lb 4.6 oz (109.9 kg) IBW/kg (Calculated) : 79.9 Heparin Dosing Weight: 102 kg  Vital Signs: Temp: 98.8 F (37.1 C) (09/12 0950) Temp Source: Oral (09/12 0950) BP: 123/101 (09/12 0950) Pulse Rate: 98 (09/12 0950)  Labs: Recent Labs    01/30/18 1657 01/31/18 0436  02/01/18 0337 02/01/18 0733 02/01/18 1446 02/02/18 0124  HGB 8.9* 7.2*  --  7.9*  --   --  8.5*  HCT 27.9* 23.4*  --  25.4*  --   --  27.9*  PLT 257 256  --  311  --   --  278  HEPARINUNFRC  --  0.18*   < > 0.17*  --  0.34 0.35  CREATININE 6.79* 8.22*  --   --  10.79*  --   --    < > = values in this interval not displayed.   Assessment:  58 yom who has known hx of ESRD (MWF), atrial fibrillation and is s/p L nephrectomy for spontaneous hemorrhage in L kidney (01/13/18) in setting of supratherapeutic INR on warfarin. Off anticoagulation since that time.   CTA 01/26/18 found probable R upper lobe PE without R heart strain. IVC filter placed 9/6.  Prior pharmacist discussed with MD and given high bleed risk will be conservative in heparin dosing with reduced goal and no bolus despite known PE.    Heparin level therapeutic (0.35) overnight on 1750 units/hr. Hgb low stable, no bleeding reported.   Goal of Therapy:  Heparin level 0.3-0.5 units/ml (lower end of therapeutic range) Monitor platelets by anticoagulation protocol: Yes   Plan:   Continue Heparin drip at 1750 units/hr  Daily heparin level and CBC.  Monitor for any s/sx bleeding.  Follow up oral anticoagulation plan.  Arty Baumgartner, [place] Pager: [phone] or phone: [phone] 02/02/2018,11:34 AM

## 2018-02-03 LAB — RENAL FUNCTION PANEL
Albumin: 2.2 g/dL — ABNORMAL LOW (ref 3.5–5.0)
Anion gap: 13 (ref 5–15)
BUN: 57 mg/dL — ABNORMAL HIGH (ref 6–20)
CO2: 25 mmol/L (ref 22–32)
Calcium: 10.1 mg/dL (ref 8.9–10.3)
Chloride: 96 mmol/L — ABNORMAL LOW (ref 98–111)
Creatinine, Ser: 8.84 mg/dL — ABNORMAL HIGH (ref 0.61–1.24)
GFR calc Af Amer: 7 mL/min — ABNORMAL LOW (ref >60)
GFR calc non Af Amer: 6 mL/min — ABNORMAL LOW (ref >60)
Glucose, Bld: 114 mg/dL — ABNORMAL HIGH (ref 70–99)
Phosphorus: 3.9 mg/dL (ref 2.5–4.6)
Potassium: 3.2 mmol/L — ABNORMAL LOW (ref 3.5–5.1)
Sodium: 134 mmol/L — ABNORMAL LOW (ref 135–145)

## 2018-02-03 LAB — CBC
HCT: 24.9 % — ABNORMAL LOW (ref 39.0–52.0)
HCT: 30.1 % — ABNORMAL LOW (ref 39.0–52.0)
Hemoglobin: 7.6 g/dL — ABNORMAL LOW (ref 13.0–17.0)
Hemoglobin: 9.3 g/dL — ABNORMAL LOW (ref 13.0–17.0)
MCH: 29.9 pg (ref 26.0–34.0)
MCH: 30.7 pg (ref 26.0–34.0)
MCHC: 30.5 g/dL (ref 30.0–36.0)
MCHC: 30.9 g/dL (ref 30.0–36.0)
MCV: 98 fL (ref 78.0–100.0)
MCV: 99.3 fL (ref 78.0–100.0)
Platelets: 313 10*3/uL (ref 150–400)
Platelets: 232 10*3/uL (ref 150–400)
RBC: 2.54 MIL/uL — ABNORMAL LOW (ref 4.22–5.81)
RBC: 3.03 MIL/uL — ABNORMAL LOW (ref 4.22–5.81)
RDW: 16.4 % — ABNORMAL HIGH (ref 11.5–15.5)
RDW: 16.8 % — ABNORMAL HIGH (ref 11.5–15.5)
WBC: 12.9 10*3/uL — ABNORMAL HIGH (ref 4.0–10.5)
WBC: 10.4 10*3/uL (ref 4.0–10.5)

## 2018-02-03 LAB — PARATHYROID HORMONE, INTACT (NO CA): PTH: 217 pg/mL — ABNORMAL HIGH (ref 15–65)

## 2018-02-03 LAB — HEPARIN LEVEL (UNFRACTIONATED): Heparin Unfractionated: 0.35 IU/mL (ref 0.30–0.70)

## 2018-02-03 MED ORDER — SODIUM CHLORIDE 0.9 % IV SOLN: 100.0000 mL | INTRAVENOUS | Status: DC | PRN

## 2018-02-03 MED ORDER — ONDANSETRON HCL 4 MG/2ML IJ SOLN
INTRAMUSCULAR | Status: AC
  Filled 2018-02-03: qty 2

## 2018-02-03 MED ORDER — ALBUTEROL SULFATE (2.5 MG/3ML) 0.083% IN NEBU: 2.5000 mg | INHALATION_SOLUTION | RESPIRATORY_TRACT | Status: DC | PRN

## 2018-02-03 MED ORDER — LIDOCAINE HCL (PF) 1 % IJ SOLN: 5.0000 mL | INTRAMUSCULAR | Status: DC | PRN

## 2018-02-03 MED ORDER — OXYCODONE-ACETAMINOPHEN 5-325 MG PO TABS
1.0000 | ORAL_TABLET | Freq: Four times a day (QID) | ORAL | Status: DC | PRN
  Administered 2018-02-06 – 2018-02-11 (×5): 1 via ORAL
  Filled 2018-02-03 (×6): qty 1

## 2018-02-03 MED ORDER — ALTEPLASE 2 MG IJ SOLR: 2.0000 mg | Freq: Once | INTRAMUSCULAR | Status: DC | PRN

## 2018-02-03 MED ORDER — LIDOCAINE-PRILOCAINE 2.5-2.5 % EX CREA: 1.0000 "application " | TOPICAL_CREAM | CUTANEOUS | Status: DC | PRN

## 2018-02-03 MED ORDER — PENTAFLUOROPROP-TETRAFLUOROETH EX AERO: 1.0000 "application " | INHALATION_SPRAY | CUTANEOUS | Status: DC | PRN

## 2018-02-03 MED ORDER — HEPARIN SODIUM (PORCINE) 1000 UNIT/ML DIALYSIS: 1000.0000 [IU] | INTRAMUSCULAR | Status: DC | PRN

## 2018-02-03 NOTE — Progress Notes (Signed)
ANTICOAGULATION CONSULT NOTE - Follow Up Consult  Pharmacy Consult for Heparin Indication: pulmonary embolus  Allergies  Allergen Reactions  . Ace Inhibitors Itching and Cough    Patient Measurements: Height: 6\' 1"  (185.4 cm) Weight: 238 lb 1.6 oz (108 kg) IBW/kg (Calculated) : 79.9 Heparin Dosing Weight: 102 kg  Vital Signs: Temp: 98 F (36.7 C) (09/13 0505) Temp Source: Oral (09/13 0505) BP: 126/68 (09/13 0505) Pulse Rate: 80 (09/13 0505)  Labs: Recent Labs    02/01/18 0337 02/01/18 0733 02/01/18 1446 02/02/18 0124 02/03/18 0421  HGB 7.9*  --   --  8.5* 7.6*  HCT 25.4*  --   --  27.9* 24.9*  PLT 311  --   --  278 313  HEPARINUNFRC 0.17*  --  0.34 0.35 0.35  CREATININE  --  10.79*  --   --   --    Assessment:  40 yom who has known hx of ESRD (MWF), atrial fibrillation and is s/p L nephrectomy for spontaneous hemorrhage in L kidney (01/13/18) in setting of supratherapeutic INR on warfarin. Off anticoagulation since that time.  CTA 01/26/18 found probable R upper lobe PE without R heart strain. IVC filter placed 9/6.  Prior pharmacist discussed with MD and given high bleed risk will be conservative in heparin dosing with reduced goal and no bolus despite known PE.   Heparin level therapeutic (0.35) this morning on 1750 units/hr. Hgb continues to drift lower to 7.6 today, plt 313. No s/sx of bleeding per nursing.    Goal of Therapy:  Heparin level 0.3-0.5 units/ml (lower end of therapeutic range) Monitor platelets by anticoagulation protocol: Yes   Plan:   Continue Heparin drip at 1750 units/hr  Daily heparin level and CBC.  Monitor for any s/sx bleeding.  Follow up oral anticoagulation plan.  Doylene Canard, PharmD Clinical Pharmacist  Pager: [phone] Phone: [phone] 02/03/2018,7:24 AM

## 2018-02-03 NOTE — Progress Notes (Signed)
Subjective: Interval History: has complaints weak.  Objective: Vital signs in last 24 hours: Temp:  [98 F (36.7 C)-99.1 F (37.3 C)] 98.4 F (36.9 C) (09/13 0734) Pulse Rate:  [74-98] 74 (09/13 0734) Resp:  [19-24] 20 (09/13 0734) BP: (107-159)/(60-138) 132/64 (09/13 0734) SpO2:  [93 %-100 %] 100 % (09/13 0734) Weight:  [449 kg] 108 kg (09/13 0505) Weight change: -1.499 kg  Intake/Output from previous day: 09/12 0701 - 09/13 0700 In: 323.5 [I.V.:323.5] Out: -  Intake/Output this shift: No intake/output data recorded.  General appearance: cooperative, no distress and morbidly obese Resp: diminished breath sounds bilaterally Cardio: S1, S2 normal and systolic murmur: systolic ejection 2/6, crescendo and decrescendo at 2nd left intercostal space GI: obese, pos bs, soft, liver down 5 cm Extremities: edema 1+ and AVF LFA  Lab Results: Recent Labs    02/02/18 0124 02/03/18 0421  WBC 12.4* 12.9*  HGB 8.5* 7.6*  HCT 27.9* 24.9*  PLT 278 313   BMET:  Recent Labs    02/01/18 0733  NA 135  K 4.4  CL 93*  CO2 23  GLUCOSE 77  BUN 62*  CREATININE 10.79*  CALCIUM 10.6*   Recent Labs    02/02/18 1029  PTH 217*   Iron Studies: No results for input(s): IRON, TIBC, TRANSFERRIN, FERRITIN in the last 72 hours.  Studies/Results: Ct Head Wo Contrast  Result Date: 02/01/2018 CLINICAL DATA:  57 y/o M; altered mental status. History of stroke, hypertension, diabetes. EXAM: CT HEAD WITHOUT CONTRAST TECHNIQUE: Contiguous axial images were obtained from the base of the skull through the vertex without intravenous contrast. COMPARISON:  03/16/2017 CT head and MRI head. FINDINGS: Brain: No evidence of acute infarction, hemorrhage, hydrocephalus, extra-axial collection or mass lesion/mass effect. Stable mild chronic microvascular ischemic changes and volume loss of the brain. Stable small chronic infarct within right hemi pons and right thalamus. Vascular: Calcific atherosclerosis of  carotid siphons. No hyperdense vessel identified. Skull: Normal. Negative for fracture or focal lesion. Sinuses/Orbits: No acute finding. Other: None. IMPRESSION: 1. No acute intracranial abnormality identified. 2. Stable chronic microvascular ischemic changes, volume loss of the brain, as well as small chronic infarcts within the right thalamus and pons. Electronically Signed   By: Kristine Garbe M.D.   On: 02/01/2018 22:15    I have reviewed the patient's current medications.  Assessment/Plan: 1 ESRD HD today 2 PE/DVT. Hb drifting down  3 Anemia esa/Fe 4 HPTH vi tD 5 Obesity 6 S/p nx 7 CO2 retention P HD, esa, follow hb, mobilize    LOS: 8 days   Jeneen Rinks Beva Remund 02/03/2018,9:25 AM

## 2018-02-03 NOTE — Progress Notes (Signed)
Patient is resting comfortably on 2L [place] with no respiratory distress noted. BIPAP is not needed at this time. RT will monitor as needed.

## 2018-02-03 NOTE — Progress Notes (Signed)
Pt transferred to room 5M04 from 4 [address] via bed.  Pt alert and oriented.  VSS.  Denies pain at this time. Pleasant.  O2 @ 2 lpm via [place].

## 2018-02-03 NOTE — Progress Notes (Signed)
Report called to [facility] on 72M, patient eating dinner at the moment.

## 2018-02-03 NOTE — Progress Notes (Signed)
[place] TEAM 1 - Stepdown/ICU TEAM  Lehman Whiteley  ZOX:[id number] DOB: 12/23/1960 DOA: 01/26/2018 PCP: Iona Beard, MD    Brief Narrative:  57 year old male with a history of ESRD on hemodialysis Monday Wednesday Friday, hypertension, CVA, GERD, gout, atrial fibrillation not on anticoagulation, systolic CHF with EF 81%, CAD, and status post left nephrectomy for spontaneous hemorrhage in the left kidney in the setting of supratherapeutic INR who came to hospital with worsening shortness of breath. CTA showed possibility of pulmonary embolism, but pneumonia was also not excluded.   Subjective: Pt is seen on HD. He c/o nausea w/ one episode of vomiting.  Denies hematemesis.  Some R sided pleuritic chest pain persists.  No abdom pain or LE pain.    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 - volume managed with HD  Chronic atrial fibrillation continue amiodarone - on heparin drip  Gout, chronic no acute flare   ESRD on M/W/F HD  HD as per Renal service   OSA: patient with increase somnolence encouraged and educated about importance of CPAP compliance - minimize the use of anxiolytics/sedatives   Depression continue remeron  Anemia of chronic kidney disease epogen and Fe as per Renal  GERD continue PPI  HCAP completed antibiotic therapy on 02/01/2018  DVT prophylaxis: IV heparin  Code Status: FULL CODE Family Communication: no family present at time of exam  Disposition Plan:   Consultants:  Nephrology  IR  Antimicrobials:  Anti-infectives (From admission, onward)   Start     Dose/Rate Route Frequency Ordered Stop   02/01/18 1023  vancomycin (VANCOCIN) 1-5 GM/200ML-% IVPB    Note to Pharmacy:  Mellody Dance, [place]   : cabinet override      02/01/18 1023 02/01/18 2229     01/30/18 1029  vancomycin (VANCOCIN) 1-5 GM/200ML-% IVPB    Note to Pharmacy:  Almira Bar   : cabinet override      01/30/18 1029 01/30/18 1144   01/27/18 2000  ceFEPIme (MAXIPIME) 1 g in sodium chloride 0.9 % 100 mL IVPB     1 g 200 mL/hr over 30 Minutes Intravenous Every 24 hours 01/26/18 1942 02/02/18 2214   01/27/18 2000  vancomycin (VANCOCIN) IVPB 1000 mg/200 mL premix     1,000 mg 200 mL/hr over 60 Minutes Intravenous Every M-W-F (Hemodialysis) 01/27/18 1707 02/01/18 1252   01/27/18 1200  vancomycin (VANCOCIN) IVPB 1000 mg/200 mL premix  Status:  Discontinued     1,000 mg 200 mL/hr over 60 Minutes Intravenous Every M-W-F (Hemodialysis) 01/26/18 1942 01/27/18 1707   01/26/18 2000  vancomycin (VANCOCIN) 2,000 mg in sodium chloride 0.9 % 500 mL IVPB     2,000 mg 250 mL/hr over 120 Minutes Intravenous  Once 01/26/18 1931 01/26/18 2121   01/26/18 1930  vancomycin (VANCOCIN) IVPB 1000 mg/200 mL premix  Status:  Discontinued     1,000 mg 200 mL/hr over 60 Minutes Intravenous  Once 01/26/18 1923 01/26/18 1931   01/26/18 1930  ceFEPIme (MAXIPIME) 2 g in sodium chloride 0.9 % 100 mL IVPB     2 g 200 mL/hr over 30 Minutes Intravenous  Once 01/26/18 1923 01/26/18 2122       Objective: Blood pressure 130/70, pulse 90, temperature 98.4 F (36.9 C), temperature source Oral, resp. rate 18,  height 6\' 1"  (1.854 m), weight 104.9 kg, SpO2 95 %.  Intake/Output Summary (Last 24 hours) at 02/03/2018 1739 Last data filed at 02/03/2018 1616 Gross per 24 hour  Intake 563.48 ml  Output 3200 ml  Net -2636.52 ml   Filed Weights   02/03/18 0505 02/03/18 1157 02/03/18 1616  Weight: 108 kg 108.2 kg 104.9 kg    Examination: General: No acute respiratory distress at rest in bed  Lungs: poor air movement in B bases  Cardiovascular: distant HS - regular rate - no M or rub  Abdomen: Nontender, nondistended, soft, bowel sounds positive, no rebound, no ascites, no appreciable mass Extremities:  trace edema bilateral lower extremities  CBC: Recent Labs  Lab 02/01/18 0337 02/02/18 0124 02/03/18 0421  WBC 11.1* 12.4* 12.9*  HGB 7.9* 8.5* 7.6*  HCT 25.4* 27.9* 24.9*  MCV 98.1 98.2 98.0  PLT 311 278 568   Basic Metabolic Panel: Recent Labs  Lab 01/30/18 1657 01/31/18 0436 02/01/18 0733 02/03/18 1225  NA 135 136 135 134*  K 4.2 4.8 4.4 3.2*  CL 94* 96* 93* 96*  CO2 26 27 23 25   GLUCOSE 100* 98 77 114*  BUN 29* 38* 62* 57*  CREATININE 6.79* 8.22* 10.79* 8.84*  CALCIUM 9.5 9.8 10.6* 10.1  PHOS 3.6  --  5.9* 3.9   GFR: Estimated Creatinine Clearance: 12.1 mL/min (A) (by C-G formula based on SCr of 8.84 mg/dL (H)).  Liver Function Tests: Recent Labs  Lab 01/28/18 0256  01/30/18 1657 01/31/18 0436 02/01/18 0733 02/03/18 1225  AST 33  --   --  44*  --   --   ALT 32  --   --  25  --   --   ALKPHOS 90  --   --  93  --   --   BILITOT 1.1  --   --  1.1  --   --   PROT 7.4  --   --  8.2*  --   --   ALBUMIN 2.5*   < > 2.3* 2.3* 2.1* 2.2*   < > = values in this interval not displayed.    HbA1C: Hgb A1c MFr Bld  Date/Time Value Ref Range Status  01/13/2015 09:41 PM 5.4 4.8 - 5.6 % Final    Comment:    (NOTE)         Pre-diabetes: 5.7 - 6.4         Diabetes: >6.4         Glycemic control for adults with diabetes: <7.0   10/06/2012 04:10 AM 5.2 <5.7 % Final    Comment:    (NOTE)                                                                       According to the ADA Clinical Practice Recommendations for 2011, when HbA1c is used as a screening test:  >=6.5%   Diagnostic of Diabetes Mellitus           (if abnormal result is confirmed) 5.7-6.4%   Increased risk of developing Diabetes Mellitus References:Diagnosis and Classification of Diabetes Mellitus,Diabetes LEXN,1700,17(CBSWH 1):S62-S69 and Standards of Medical Care in         Diabetes - 2011,Diabetes QPRF,1638,46 (Suppl 1):S11-S61.  CBG: Recent Labs  Lab 01/30/18 0756 01/30/18 1412  01/30/18 2239 01/31/18 0057 02/01/18 2044  GLUCAP 80 91 151* 109* 97    Recent Results (from the past 240 hour(s))  Blood Culture (routine x 2)     Status: None   Collection Time: 01/26/18  8:45 PM  Result Value Ref Range Status   Specimen Description BLOOD RIGHT ANTECUBITAL  Final   Special Requests   Final    BOTTLES DRAWN AEROBIC AND ANAEROBIC Blood Culture adequate volume   Culture   Final    NO GROWTH 5 DAYS Performed at [facility], [place] [address], [place], [place] [address]    Report Status 01/31/2018 FINAL  Final  Blood Culture (routine x 2)     Status: None   Collection Time: 01/26/18  8:45 PM  Result Value Ref Range Status   Specimen Description BLOOD RIGHT HAND  Final   Special Requests   Final    BOTTLES DRAWN AEROBIC ONLY Blood Culture results may not be optimal due to an inadequate volume of blood received in culture bottles   Culture   Final    NO GROWTH 5 DAYS Performed at [facility], [place] [address] [place], [place] [address]    Report Status 01/31/2018 FINAL  Final     Scheduled Meds: . allopurinol  150 mg Oral QODAY  . amiodarone  100 mg Oral Daily  . chlorhexidine  15 mL Mouth Rinse BID  . Chlorhexidine Gluconate Cloth  6 each Topical Q0600  . cinacalcet  60 mg Oral Q supper  . darbepoetin (ARANESP) injection - DIALYSIS  200 mcg Intravenous Q Mon-HD  . feeding supplement (PRO-STAT SUGAR FREE 64)  30 mL Oral BID  . ferric citrate  630 mg Oral TID WC  . mouth rinse  15 mL Mouth Rinse q12n4p  . midodrine  5 mg Oral TID WC  . mirtazapine  7.5 mg Oral QHS  . multivitamin  1 tablet Oral QHS  . pantoprazole  40 mg Oral BID   Continuous Infusions: . heparin 1,750 Units/hr (02/03/18 0426)     LOS: 8 days   Cherene Altes, MD [facility] Office  [phone] Pager - Text Page per Amion  If 7PM-7AM, please contact night-coverage per Amion 02/03/2018, 5:39 PM

## 2018-02-03 NOTE — Procedures (Signed)
I was present at this session.  I have reviewed the session itself and made appropriate changes.  HD via. LLA avf.  bp ok. Some vol xs . Access press ok.   Jeneen Rinks Tiffany Calmes 9/13/20191:12 PM

## 2018-02-04 LAB — CBC
HCT: 27.1 % — ABNORMAL LOW (ref 39.0–52.0)
Hemoglobin: 8.4 g/dL — ABNORMAL LOW (ref 13.0–17.0)
MCH: 30.4 pg (ref 26.0–34.0)
MCHC: 31 g/dL (ref 30.0–36.0)
MCV: 98.2 fL (ref 78.0–100.0)
Platelets: 255 10*3/uL (ref 150–400)
RBC: 2.76 MIL/uL — ABNORMAL LOW (ref 4.22–5.81)
RDW: 17 % — ABNORMAL HIGH (ref 11.5–15.5)
WBC: 10.9 10*3/uL — ABNORMAL HIGH (ref 4.0–10.5)

## 2018-02-04 LAB — COMPREHENSIVE METABOLIC PANEL
ALT: 24 U/L (ref 0–44)
AST: 33 U/L (ref 15–41)
Albumin: 2.4 g/dL — ABNORMAL LOW (ref 3.5–5.0)
Alkaline Phosphatase: 82 U/L (ref 38–126)
Anion gap: 12 (ref 5–15)
BUN: 40 mg/dL — ABNORMAL HIGH (ref 6–20)
CO2: 27 mmol/L (ref 22–32)
Calcium: 10.3 mg/dL (ref 8.9–10.3)
Chloride: 95 mmol/L — ABNORMAL LOW (ref 98–111)
Creatinine, Ser: 7.8 mg/dL — ABNORMAL HIGH (ref 0.61–1.24)
GFR calc Af Amer: 8 mL/min — ABNORMAL LOW (ref >60)
GFR calc non Af Amer: 7 mL/min — ABNORMAL LOW (ref >60)
Glucose, Bld: 109 mg/dL — ABNORMAL HIGH (ref 70–99)
Potassium: 3.4 mmol/L — ABNORMAL LOW (ref 3.5–5.1)
Sodium: 134 mmol/L — ABNORMAL LOW (ref 135–145)
Total Bilirubin: 1 mg/dL (ref 0.3–1.2)
Total Protein: 8.2 g/dL — ABNORMAL HIGH (ref 6.5–8.1)

## 2018-02-04 LAB — AMMONIA: Ammonia: 40 umol/L — ABNORMAL HIGH (ref 9–35)

## 2018-02-04 LAB — HEPARIN LEVEL (UNFRACTIONATED): Heparin Unfractionated: 0.47 IU/mL (ref 0.30–0.70)

## 2018-02-04 MED ORDER — NEPRO/CARBSTEADY PO LIQD
237.0000 mL | Freq: Three times a day (TID) | ORAL | Status: DC
  Administered 2018-02-04 – 2018-02-09 (×12): 237 mL via ORAL
  Filled 2018-02-04 (×19): qty 237

## 2018-02-04 NOTE — Progress Notes (Addendum)
[facility] Progress Note   Subjective:   Seen at bedside. Feeling better today.  Denies SOB.  Reports n/v and weakness post HD yesterday.   Objective Vitals:   02/03/18 1815 02/03/18 2056 02/04/18 0454 02/04/18 0814  BP: (!) 142/61 133/90 104/62 (!) 148/65  Pulse: 97 97 81 83  Resp: 19 18 16  (!) 22  Temp: 98.2 F (36.8 C) 99.1 F (37.3 C) 98.3 F (36.8 C) 98 F (36.7 C)  TempSrc: Oral Oral Oral Oral  SpO2: 100% 99% 98% 98%  Weight:      Height:       Physical Exam General:NAD, obese male Heart:RRR Lungs:CTAB, nml WOB, BS diminished Abdomen:soft, NTND Extremities:no LE edema Dialysis Access: LU AVF, +b/t   Filed Weights   02/03/18 0505 02/03/18 1157 02/03/18 1616  Weight: 108 kg 108.2 kg 104.9 kg    Intake/Output Summary (Last 24 hours) at 02/04/2018 0926 Last data filed at 02/04/2018 0900 Gross per 24 hour  Intake 1182.94 ml  Output 3200 ml  Net -2017.06 ml    Additional Objective Labs: Basic Metabolic Panel: Recent Labs  Lab 01/30/18 1657  02/01/18 0733 02/03/18 1225 02/04/18 0628  NA 135   < > 135 134* 134*  K 4.2   < > 4.4 3.2* 3.4*  CL 94*   < > 93* 96* 95*  CO2 26   < > 23 25 27   GLUCOSE 100*   < > 77 114* 109*  BUN 29*   < > 62* 57* 40*  CREATININE 6.79*   < > 10.79* 8.84* 7.80*  CALCIUM 9.5   < > 10.6* 10.1 10.3  PHOS 3.6  --  5.9* 3.9  --    < > = values in this interval not displayed.   Liver Function Tests: Recent Labs  Lab 01/31/18 0436 02/01/18 0733 02/03/18 1225 02/04/18 0628  AST 44*  --   --  33  ALT 25  --   --  24  ALKPHOS 93  --   --  82  BILITOT 1.1  --   --  1.0  PROT 8.2*  --   --  8.2*  ALBUMIN 2.3* 2.1* 2.2* 2.4*   No results for input(s): LIPASE, AMYLASE in the last 168 hours. CBC: Recent Labs  Lab 02/01/18 0337 02/02/18 0124 02/03/18 0421 02/03/18 1903 02/04/18 0628  WBC 11.1* 12.4* 12.9* 10.4 10.9*  HGB 7.9* 8.5* 7.6* 9.3* 8.4*  HCT 25.4* 27.9* 24.9* 30.1* 27.1*  MCV 98.1 98.2 98.0 99.3  98.2  PLT 311 278 313 232 255   Blood Culture    Component Value Date/Time   SDES BLOOD RIGHT ANTECUBITAL 01/26/2018 2045   SDES BLOOD RIGHT HAND 01/26/2018 2045   SPECREQUEST  01/26/2018 2045    BOTTLES DRAWN AEROBIC AND ANAEROBIC Blood Culture adequate volume   SPECREQUEST  01/26/2018 2045    BOTTLES DRAWN AEROBIC ONLY Blood Culture results may not be optimal due to an inadequate volume of blood received in culture bottles   CULT  01/26/2018 2045    NO GROWTH 5 DAYS Performed at [facility] [address], [place], [place] [address]    CULT  01/26/2018 2045    NO GROWTH 5 DAYS Performed at [facility], [place] [address] [place], [place] [address]    REPTSTATUS 01/31/2018 FINAL 01/26/2018 2045   REPTSTATUS 01/31/2018 FINAL 01/26/2018 2045   CBG: Recent Labs  Lab 01/30/18 0756 01/30/18 1412 01/30/18 2239 01/31/18 0057 02/01/18  2044  GLUCAP 80 91 151* 109* 97    Medications: . heparin 1,750 Units/hr (02/04/18 0900)   . allopurinol  150 mg Oral QODAY  . amiodarone  100 mg Oral Daily  . chlorhexidine  15 mL Mouth Rinse BID  . Chlorhexidine Gluconate Cloth  6 each Topical Q0600  . cinacalcet  60 mg Oral Q supper  . darbepoetin (ARANESP) injection - DIALYSIS  200 mcg Intravenous Q Mon-HD  . feeding supplement (NEPRO CARB STEADY)  237 mL Oral TID AC  . feeding supplement (PRO-STAT SUGAR FREE 64)  30 mL Oral BID  . ferric citrate  630 mg Oral TID WC  . mouth rinse  15 mL Mouth Rinse q12n4p  . midodrine  5 mg Oral TID WC  . mirtazapine  7.5 mg Oral QHS  . multivitamin  1 tablet Oral QHS  . pantoprazole  40 mg Oral BID    Dialysis Orders: MWF at [facility] 4hr, 450/800, EDW 111.5kg, 2K/2.25Ca, #4/linear Na, AVF, Hep 4500 + 1500 - Hectoral 36mcg IV q HD - Parsabiv 12.5mg  IV q HD - Mircera 92mcg q 2 weeks  Assessment/Plan: 1. R Pulmonary emboli - s/p IVC filter d/t PE & L DVT.  On heparin to transition for warfarin today per primary.  2. ESRD - MWF schedule.   Continue HD per regular schedule. Significantly under edw, will need to establish new EDW prior to d/c. 3. Anemia of CKD- Hgb 8.4. Receiving aranesp 248mcg qwk (Mon). Follow trends.  4. Secondary hyperparathyroidism - CCa 12.2>11.6. pth 217. VDRA d/c due to hypercalcemia.  Continue auryxia & sensipar.     5. HTN/volume - BP variable. On midodrine. Appears euvolemic on exam.  Establishing new EDW. 6. Nutrition - Alb 2.4. Renal diet w/ fluid restrictions, added Nepro. Cont prostat, renavite.  7. L renal hemorrhage/hematoma - stable per CT 9/11 8. Hx A flutter - on amio 9. NICM (EF 30-35%) 10 severe debill from prolonged illness, he is very depressed  Jen Mow, PA-C [facility] Pager: [phone] 02/04/2018,9:26 AM  LOS: 9 days  I have seen and examined this patient and agree with the plan of care seen, eval, examined , counseled. Jeneen Rinks Loraina Stauffer 02/04/2018, 11:14 AM

## 2018-02-04 NOTE — Progress Notes (Signed)
[facility] TEAM 1 - Stepdown/ICU TEAM  [name]  ZHG:[id number] DOB: [birth year] DOA: 01/26/2018 PCP: Iona Beard, MD    Brief Narrative:  57 year old male with a history of ESRD on hemodialysis Monday Wednesday Friday, hypertension, CVA, GERD, gout, atrial fibrillation not on anticoagulation, systolic CHF with EF 19%, CAD, and status post left nephrectomy for spontaneous hemorrhage in the left kidney in the setting of supratherapeutic INR who came to hospital with worsening shortness of breath.  CTA showed possibility of pulmonary embolism, but pneumonia was also not excluded.   Subjective: Affect is flat.  Pt appears depressed.  Reports some ongoing lateral pleuritic type chest pain.  Denies SSCP.  Poor appetite.  I had a lengthy discussion w/ the pt and his wife (who was on speaker phone) regarding resumption of coumadin.  He is worried about bleeding again, but has been educated on the risk of worsening or repeat PE leading to death, or CVA due to Afib.  He is not ready to make this decision today, but has agreed to give me an answer when I round tomorrow.    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 - volume managed with HD - well compensated at this time   Chronic atrial fibrillation continue amiodarone - on heparin drip - rate controlled   Gout, chronic no acute flare at this time   ESRD on M/W/F HD  HD as per Renal service   OSA encouraged and educated about importance of CPAP compliance - minimize the use of anxiolytics/sedatives   Depression continue remeron  Anemia of chronic kidney disease epogen and Fe as per Renal  GERD continue  PPI  HCAP completed antibiotic therapy on 02/01/2018  DVT prophylaxis: IV heparin  Code Status: FULL CODE Family Communication: spoke w/ wife via phone Disposition Plan: awaiting determination on resumption of warfarin   Consultants:  Nephrology  IR  Antimicrobials:  Anti-infectives (From admission, onward)   Start     Dose/Rate Route Frequency Ordered Stop   02/01/18 1023  vancomycin (VANCOCIN) 1-5 GM/200ML-% IVPB    Note to Pharmacy:  Mellody Dance, [place]   : cabinet override      02/01/18 1023 02/01/18 2229   01/30/18 1029  vancomycin (VANCOCIN) 1-5 GM/200ML-% IVPB    Note to Pharmacy:  Almira Bar   : cabinet override      01/30/18 1029 01/30/18 1144   01/27/18 2000  ceFEPIme (MAXIPIME) 1 g in sodium chloride 0.9 % 100 mL IVPB     1 g 200 mL/hr over 30 Minutes Intravenous Every 24 hours 01/26/18 1942 02/02/18 2214   01/27/18 2000  vancomycin (VANCOCIN) IVPB 1000 mg/200 mL premix     1,000 mg 200 mL/hr over 60 Minutes Intravenous Every M-W-F (Hemodialysis) 01/27/18 1707 02/01/18 1252   01/27/18 1200  vancomycin (VANCOCIN) IVPB 1000 mg/200 mL premix  Status:  Discontinued     1,000 mg 200 mL/hr over 60 Minutes Intravenous Every M-W-F (Hemodialysis) 01/26/18 1942 01/27/18 1707   01/26/18 2000  vancomycin (VANCOCIN) 2,000 mg in sodium chloride 0.9 % 500 mL IVPB     2,000 mg 250 mL/hr over  120 Minutes Intravenous  Once 01/26/18 1931 01/26/18 2121   01/26/18 1930  vancomycin (VANCOCIN) IVPB 1000 mg/200 mL premix  Status:  Discontinued     1,000 mg 200 mL/hr over 60 Minutes Intravenous  Once 01/26/18 1923 01/26/18 1931   01/26/18 1930  ceFEPIme (MAXIPIME) 2 g in sodium chloride 0.9 % 100 mL IVPB     2 g 200 mL/hr over 30 Minutes Intravenous  Once 01/26/18 1923 01/26/18 2122       Objective: Blood pressure (!) 148/65, pulse 83, temperature 98 F (36.7 C), temperature source Oral, resp. rate (!) 22, height 6\' 1"  (1.854 m), weight 104.9 kg, SpO2 98 %.  Intake/Output Summary  (Last 24 hours) at 02/04/2018 1330 Last data filed at 02/04/2018 0951 Gross per 24 hour  Intake 1422.94 ml  Output 3200 ml  Net -1777.06 ml   Filed Weights   02/03/18 0505 02/03/18 1157 02/03/18 1616  Weight: 108 kg 108.2 kg 104.9 kg    Examination: General: No acute respiratory distress Lungs: poor air movement in B bases - no wheezing  Cardiovascular: distant HS - regular rate  Abdomen: NT/ND, soft, bs+, no mass  Extremities: trace edema bilateral lower extremities w/o change   CBC: Recent Labs  Lab 02/03/18 0421 02/03/18 1903 02/04/18 0628  WBC 12.9* 10.4 10.9*  HGB 7.6* 9.3* 8.4*  HCT 24.9* 30.1* 27.1*  MCV 98.0 99.3 98.2  PLT 313 232 759   Basic Metabolic Panel: Recent Labs  Lab 01/30/18 1657  02/01/18 0733 02/03/18 1225 02/04/18 0628  NA 135   < > 135 134* 134*  K 4.2   < > 4.4 3.2* 3.4*  CL 94*   < > 93* 96* 95*  CO2 26   < > 23 25 27   GLUCOSE 100*   < > 77 114* 109*  BUN 29*   < > 62* 57* 40*  CREATININE 6.79*   < > 10.79* 8.84* 7.80*  CALCIUM 9.5   < > 10.6* 10.1 10.3  PHOS 3.6  --  5.9* 3.9  --    < > = values in this interval not displayed.   GFR: Estimated Creatinine Clearance: 13.8 mL/min (A) (by C-G formula based on SCr of 7.8 mg/dL (H)).  Liver Function Tests: Recent Labs  Lab 01/31/18 0436 02/01/18 0733 02/03/18 1225 02/04/18 0628  AST 44*  --   --  33  ALT 25  --   --  24  ALKPHOS 93  --   --  82  BILITOT 1.1  --   --  1.0  PROT 8.2*  --   --  8.2*  ALBUMIN 2.3* 2.1* 2.2* 2.4*    HbA1C: Hgb A1c MFr Bld  Date/Time Value Ref Range Status  01/13/2015 09:41 PM 5.4 4.8 - 5.6 % Final    Comment:    (NOTE)         Pre-diabetes: 5.7 - 6.4         Diabetes: >6.4         Glycemic control for adults with diabetes: <7.0   10/06/2012 04:10 AM 5.2 <5.7 % Final    Comment:    (NOTE)  According to the ADA Clinical Practice Recommendations for 2011, when HbA1c is used  as a screening test:  >=6.5%   Diagnostic of Diabetes Mellitus           (if abnormal result is confirmed) 5.7-6.4%   Increased risk of developing Diabetes Mellitus References:Diagnosis and Classification of Diabetes Mellitus,Diabetes [id number],9179,15(AVWPV 1):S62-S69 and Standards of Medical Care in         Diabetes - 2011,Diabetes XYIA,1655,37 (Suppl 1):S11-S61.    CBG: Recent Labs  Lab 01/30/18 0756 01/30/18 1412 01/30/18 2239 01/31/18 0057 02/01/18 2044  GLUCAP 80 91 151* 109* 97    Recent Results (from the past 240 hour(s))  Blood Culture (routine x 2)     Status: None   Collection Time: 01/26/18  8:45 PM  Result Value Ref Range Status   Specimen Description BLOOD RIGHT ANTECUBITAL  Final   Special Requests   Final    BOTTLES DRAWN AEROBIC AND ANAEROBIC Blood Culture adequate volume   Culture   Final    NO GROWTH 5 DAYS Performed at [facility], [place] [address], [place], [place] [address]    Report Status 01/31/2018 FINAL  Final  Blood Culture (routine x 2)     Status: None   Collection Time: 01/26/18  8:45 PM  Result Value Ref Range Status   Specimen Description BLOOD RIGHT HAND  Final   Special Requests   Final    BOTTLES DRAWN AEROBIC ONLY Blood Culture results may not be optimal due to an inadequate volume of blood received in culture bottles   Culture   Final    NO GROWTH 5 DAYS Performed at [facility], [place] [address], [place], [place] [address]    Report Status 01/31/2018 FINAL  Final     Scheduled Meds: . allopurinol  150 mg Oral QODAY  . amiodarone  100 mg Oral Daily  . chlorhexidine  15 mL Mouth Rinse BID  . Chlorhexidine Gluconate Cloth  6 each Topical Q0600  . cinacalcet  60 mg Oral Q supper  . darbepoetin (ARANESP) injection - DIALYSIS  200 mcg Intravenous Q Mon-HD  . feeding supplement (NEPRO CARB STEADY)  237 mL Oral TID AC  . feeding supplement (PRO-STAT SUGAR FREE 64)  30 mL Oral BID  . ferric citrate  630 mg Oral TID WC  . mouth  rinse  15 mL Mouth Rinse q12n4p  . midodrine  5 mg Oral TID WC  . mirtazapine  7.5 mg Oral QHS  . multivitamin  1 tablet Oral QHS  . pantoprazole  40 mg Oral BID   Continuous Infusions: . heparin 1,750 Units/hr (02/04/18 0900)     LOS: 9 days   Cherene Altes, MD [facility] Office  [phone] Pager - Text Page per Amion  If 7PM-7AM, please contact night-coverage per Amion 02/04/2018, 1:30 PM

## 2018-02-05 LAB — BASIC METABOLIC PANEL
Anion gap: 16 — ABNORMAL HIGH (ref 5–15)
BUN: 55 mg/dL — ABNORMAL HIGH (ref 6–20)
CO2: 23 mmol/L (ref 22–32)
Calcium: 11.3 mg/dL — ABNORMAL HIGH (ref 8.9–10.3)
Chloride: 94 mmol/L — ABNORMAL LOW (ref 98–111)
Creatinine, Ser: 10.16 mg/dL — ABNORMAL HIGH (ref 0.61–1.24)
GFR calc Af Amer: 6 mL/min — ABNORMAL LOW (ref >60)
GFR calc non Af Amer: 5 mL/min — ABNORMAL LOW (ref >60)
Glucose, Bld: 76 mg/dL (ref 70–99)
Potassium: 4.1 mmol/L (ref 3.5–5.1)
Sodium: 133 mmol/L — ABNORMAL LOW (ref 135–145)

## 2018-02-05 LAB — CBC
HCT: 28.8 % — ABNORMAL LOW (ref 39.0–52.0)
Hemoglobin: 8.8 g/dL — ABNORMAL LOW (ref 13.0–17.0)
MCH: 30 pg (ref 26.0–34.0)
MCHC: 30.6 g/dL (ref 30.0–36.0)
MCV: 98.3 fL (ref 78.0–100.0)
Platelets: 269 10*3/uL (ref 150–400)
RBC: 2.93 MIL/uL — ABNORMAL LOW (ref 4.22–5.81)
RDW: 17.2 % — ABNORMAL HIGH (ref 11.5–15.5)
WBC: 10.1 10*3/uL (ref 4.0–10.5)

## 2018-02-05 LAB — HEPARIN LEVEL (UNFRACTIONATED): Heparin Unfractionated: 0.42 IU/mL (ref 0.30–0.70)

## 2018-02-05 MED ORDER — WARFARIN SODIUM 10 MG PO TABS
10.0000 mg | ORAL_TABLET | Freq: Once | ORAL | Status: AC
  Administered 2018-02-05: 10 mg via ORAL
  Filled 2018-02-05: qty 1

## 2018-02-05 MED ORDER — CHLORHEXIDINE GLUCONATE CLOTH 2 % EX PADS: 6.0000 | MEDICATED_PAD | Freq: Every day | CUTANEOUS | Status: DC

## 2018-02-05 MED ORDER — BENZONATATE 100 MG PO CAPS: 100.0000 mg | ORAL_CAPSULE | Freq: Three times a day (TID) | ORAL | Status: DC | PRN

## 2018-02-05 MED ORDER — WARFARIN - PHARMACIST DOSING INPATIENT
Freq: Every day | Status: DC
  Administered 2018-02-06 – 2018-02-10 (×3)

## 2018-02-05 MED ORDER — SUCROFERRIC OXYHYDROXIDE 500 MG PO CHEW
1500.0000 mg | CHEWABLE_TABLET | Freq: Three times a day (TID) | ORAL | Status: DC
  Administered 2018-02-05 – 2018-02-07 (×5): 1500 mg via ORAL
  Administered 2018-02-07: 500 mg via ORAL
  Administered 2018-02-08 – 2018-02-11 (×7): 1500 mg via ORAL
  Filled 2018-02-05 (×20): qty 3

## 2018-02-05 NOTE — Progress Notes (Signed)
ANTICOAGULATION CONSULT NOTE - Follow Up Consult  Pharmacy Consult for Heparin and warfarin Indication: pulmonary embolus  Allergies  Allergen Reactions  . Ace Inhibitors Itching and Cough    Patient Measurements: Height: 6\' 1"  (185.4 cm) Weight: 231 lb 4.2 oz (104.9 kg) IBW/kg (Calculated) : 79.9 Heparin Dosing Weight: 102 kg  Vital Signs:    Labs: Recent Labs    02/03/18 0421 02/03/18 1225 02/03/18 1903 02/04/18 0628 02/05/18 0637  HGB 7.6*  --  9.3* 8.4* 8.8*  HCT 24.9*  --  30.1* 27.1* 28.8*  PLT 313  --  232 255 269  HEPARINUNFRC 0.35  --   --  0.47 0.42  CREATININE  --  8.84*  --  7.80* 10.16*   Assessment:  36 yom who has known hx of ESRD (MWF), atrial fibrillation and is s/p L nephrectomy for spontaneous hemorrhage in L kidney (01/13/18) in setting of supratherapeutic INR on warfarin. Off anticoagulation since that time.  CTA 01/26/18 found probable R upper lobe PE without R heart strain. IVC filter placed 9/6.  Prior pharmacist discussed with MD and given high bleed risk will be conservative in heparin dosing with reduced goal and no bolus despite known PE.   Heparin level therapeutic (0.42) this morning on 1750 units/hr. Hgb 8.8 today, plt 269. No s/sx of bleeding per nursing.   Starting warfarin 9/15 PM. Looks like patient may have been previously been therapeutic at 15 mg warfarin.  Goal of Therapy:  Heparin level 0.3-0.5 units/ml (lower end of therapeutic range) Monitor platelets by anticoagulation protocol: Yes   Plan:  Warfarin 10 mg tonight x 1 Continue Heparin drip at 1750 units/hr Daily heparin level, INR, and CBC. Monitor for any s/sx bleeding. Follow up oral anticoagulation plan.  Angus Seller, PharmD Pharmacy Resident Please check AMION for all [facility] phone numbers After 10:00 PM, call [place] [phone] 02/05/2018 3:58 PM

## 2018-02-05 NOTE — Progress Notes (Signed)
[name] TEAM 1 - Stepdown/ICU TEAM  [name]  QIO:[id number] DOB: 1960/11/23 DOA: 01/26/2018 PCP: Iona Beard, MD    Brief Narrative:  57 year old male with a history of ESRD on hemodialysis Monday Wednesday Friday, hypertension, CVA, GERD, gout, atrial fibrillation not on anticoagulation, systolic CHF with EF 32%, CAD, and status post left nephrectomy for spontaneous hemorrhage in the left kidney in the setting of supratherapeutic INR who came to hospital with worsening shortness of breath.  CTA showed possibility of pulmonary embolism, but pneumonia was also not excluded.   Subjective: Pt reports ongoing pleuritic R lateral chest wall pain.  He had a dry hacking cough last night. He denies sob, n/v, or abdom pain.  He tells me he has decided to try warfarin tx again.    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 - volume managed with HD - well compensated  Chronic atrial fibrillation continue amiodarone - on heparin drip - rate controlled - to resume warfarin today   Gout, chronic no acute flare at this time   ESRD on M/W/F HD  HD as per Renal service   OSA encouraged and educated about importance of CPAP compliance - minimize the use of anxiolytics/sedatives   Depression continue remeron - needs to increase activity   Anemia of chronic kidney disease epogen and Fe as per Renal  GERD continue PPI  HCAP completed antibiotic therapy on 02/01/2018 - no evidence of active infection presently   DVT prophylaxis: IV heparin >  warfarin Code Status: FULL CODE Family Communication: spoke w/ wife via phone Disposition Plan: resume warfarin - cont PT/OT - await therapeutic INR - assure no bleeding - probable d/c to SNF for rehab v/s CIR v/s home   Consultants:  Nephrology  IR  Antimicrobials:  No antibiotics since 9/11  Objective: Blood pressure 104/73, pulse 88, temperature 98.2 F (36.8 C), temperature source Oral, resp. rate 18, height 6\' 1"  (1.854 m), weight 104.9 kg, SpO2 100 %.  Intake/Output Summary (Last 24 hours) at 02/05/2018 1503 Last data filed at 02/05/2018 1400 Gross per 24 hour  Intake 965.32 ml  Output -  Net 965.32 ml   Filed Weights   02/03/18 0505 02/03/18 1157 02/03/18 1616  Weight: 108 kg 108.2 kg 104.9 kg    Examination: General: No acute respiratory distress - alert and conversant  Lungs: poor air movement in B bases w/o wheezing or focal crackles  Cardiovascular: distant HS - regular rate w/o M or rub  Abdomen: NT/ND, soft, bs+, no mass  Extremities: no signif edema B LE   CBC: Recent Labs  Lab 02/03/18 1903 02/04/18 0628 02/05/18 0637  WBC 10.4 10.9* 10.1  HGB 9.3* 8.4* 8.8*  HCT 30.1* 27.1* 28.8*  MCV 99.3 98.2 98.3  PLT 232 255 440   Basic Metabolic Panel: Recent Labs  Lab 01/30/18 1657  02/01/18 0733 02/03/18 1225 02/04/18 0628 02/05/18 0637  NA 135   < > 135 134* 134* 133*  K 4.2   < > 4.4  3.2* 3.4* 4.1  CL 94*   < > 93* 96* 95* 94*  CO2 26   < > 23 25 27 23   GLUCOSE 100*   < > 77 114* 109* 76  BUN 29*   < > 62* 57* 40* 55*  CREATININE 6.79*   < > 10.79* 8.84* 7.80* 10.16*  CALCIUM 9.5   < > 10.6* 10.1 10.3 11.3*  PHOS 3.6  --  5.9* 3.9  --   --    < > = values in this interval not displayed.   GFR: Estimated Creatinine Clearance: 10.6 mL/min (A) (by C-G formula based on SCr of 10.16 mg/dL (H)).  Liver Function Tests: Recent Labs  Lab 01/31/18 0436 02/01/18 0733 02/03/18 1225 02/04/18 0628  AST 44*  --   --  33  ALT 25  --   --  24    ALKPHOS 93  --   --  82  BILITOT 1.1  --   --  1.0  PROT 8.2*  --   --  8.2*  ALBUMIN 2.3* 2.1* 2.2* 2.4*    HbA1C: Hgb A1c MFr Bld  Date/Time Value Ref Range Status  01/13/2015 09:41 PM 5.4 4.8 - 5.6 % Final    Comment:    (NOTE)         Pre-diabetes: 5.7 - 6.4         Diabetes: >6.4         Glycemic control for adults with diabetes: <7.0   10/06/2012 04:10 AM 5.2 <5.7 % Final    Comment:    (NOTE)                                                                       According to the ADA Clinical Practice Recommendations for 2011, when HbA1c is used as a screening test:  >=6.5%   Diagnostic of Diabetes Mellitus           (if abnormal result is confirmed) 5.7-6.4%   Increased risk of developing Diabetes Mellitus References:Diagnosis and Classification of Diabetes Mellitus,Diabetes JJKK,9381,82(XHBZJ 1):S62-S69 and Standards of Medical Care in         Diabetes - 2011,Diabetes Care,2011,34 (Suppl 1):S11-S61.    CBG: Recent Labs  Lab 01/30/18 0756 01/30/18 1412 01/30/18 2239 01/31/18 0057 02/01/18 2044  GLUCAP 80 91 151* 109* 97    Recent Results (from the past 240 hour(s))  Blood Culture (routine x 2)     Status: None   Collection Time: 01/26/18  8:45 PM  Result Value Ref Range Status   Specimen Description BLOOD RIGHT ANTECUBITAL  Final   Special Requests   Final    BOTTLES DRAWN AEROBIC AND ANAEROBIC Blood Culture adequate volume   Culture   Final    NO GROWTH 5 DAYS Performed at [facility], [place] [address], [place], [place] [address]    Report Status 01/31/2018 FINAL  Final  Blood Culture (routine x 2)     Status: None   Collection Time: 01/26/18  8:45 PM  Result Value Ref Range Status   Specimen Description BLOOD RIGHT HAND  Final   Special Requests   Final    BOTTLES DRAWN AEROBIC ONLY Blood Culture results may not be optimal  due to an inadequate volume of blood received in culture bottles   Culture   Final    NO GROWTH 5 DAYS Performed at  [facility], [place] [address], [place], [place] [address]    Report Status 01/31/2018 FINAL  Final     Scheduled Meds: . allopurinol  150 mg Oral QODAY  . amiodarone  100 mg Oral Daily  . chlorhexidine  15 mL Mouth Rinse BID  . Chlorhexidine Gluconate Cloth  6 each Topical Q0600  . cinacalcet  60 mg Oral Q supper  . darbepoetin (ARANESP) injection - DIALYSIS  200 mcg Intravenous Q Mon-HD  . feeding supplement (NEPRO CARB STEADY)  237 mL Oral TID AC  . feeding supplement (PRO-STAT SUGAR FREE 64)  30 mL Oral BID  . mouth rinse  15 mL Mouth Rinse q12n4p  . midodrine  5 mg Oral TID WC  . mirtazapine  7.5 mg Oral QHS  . multivitamin  1 tablet Oral QHS  . pantoprazole  40 mg Oral BID  . sucroferric oxyhydroxide  1,500 mg Oral TID WC   Continuous Infusions: . heparin 1,750 Units/hr (02/04/18 2234)     LOS: 10 days   Cherene Altes, MD [facility] Office  [phone] Pager - Text Page per Shea Evans  If 7PM-7AM, please contact night-coverage per Amion 02/05/2018, 3:03 PM

## 2018-02-05 NOTE — Progress Notes (Addendum)
[facility] Progress Note   Subjective:   Seen at bedside.  Complains of coughing all night and worsened R sided pleuritic CP as a result this AM.  Also reports diarrhea, requesting change in binder.  States he has been drinking a lot because his mouth is dry.  Objective Vitals:   02/04/18 0454 02/04/18 0814 02/04/18 2004 02/05/18 0344  BP: 104/62 (!) 148/65 127/66 104/73  Pulse: 81 83 85 88  Resp: 16 (!) 22 18 18   Temp: 98.3 F (36.8 C) 98 F (36.7 C) 98.4 F (36.9 C) 98.2 F (36.8 C)  TempSrc: Oral Oral Oral Oral  SpO2: 98% 98% 100% 100%  Weight:      Height:       Physical Exam General:NAD, obese male sitting in bed Depressed Heart:RRR Gr 2/ m Lungs:breath sounds diminished,  not wearing O2 Abdomen:soft, NTND obese Extremities:no LE edema Dialysis Access: LU AVF +b/t   Filed Weights   02/03/18 0505 02/03/18 1157 02/03/18 1616  Weight: 108 kg 108.2 kg 104.9 kg    Intake/Output Summary (Last 24 hours) at 02/05/2018 0903 Last data filed at 02/05/2018 0600 Gross per 24 hour  Intake 1047.32 ml  Output -  Net 1047.32 ml    Additional Objective Labs: Basic Metabolic Panel: Recent Labs  Lab 01/30/18 1657  02/01/18 0733 02/03/18 1225 02/04/18 0628 02/05/18 0637  NA 135   < > 135 134* 134* 133*  K 4.2   < > 4.4 3.2* 3.4* 4.1  CL 94*   < > 93* 96* 95* 94*  CO2 26   < > 23 25 27 23   GLUCOSE 100*   < > 77 114* 109* 76  BUN 29*   < > 62* 57* 40* 55*  CREATININE 6.79*   < > 10.79* 8.84* 7.80* 10.16*  CALCIUM 9.5   < > 10.6* 10.1 10.3 11.3*  PHOS 3.6  --  5.9* 3.9  --   --    < > = values in this interval not displayed.   Liver Function Tests: Recent Labs  Lab 01/31/18 0436 02/01/18 0733 02/03/18 1225 02/04/18 0628  AST 44*  --   --  33  ALT 25  --   --  24  ALKPHOS 93  --   --  82  BILITOT 1.1  --   --  1.0  PROT 8.2*  --   --  8.2*  ALBUMIN 2.3* 2.1* 2.2* 2.4*    CBC: Recent Labs  Lab 02/02/18 0124 02/03/18 0421 02/03/18 1903  02/04/18 0628 02/05/18 0637  WBC 12.4* 12.9* 10.4 10.9* 10.1  HGB 8.5* 7.6* 9.3* 8.4* 8.8*  HCT 27.9* 24.9* 30.1* 27.1* 28.8*  MCV 98.2 98.0 99.3 98.2 98.3  PLT 278 313 232 255 269   CBG: Recent Labs  Lab 01/30/18 0756 01/30/18 1412 01/30/18 2239 01/31/18 0057 02/01/18 2044  GLUCAP 80 91 151* 109* 97    Medications: . heparin 1,750 Units/hr (02/04/18 2234)   . allopurinol  150 mg Oral QODAY  . amiodarone  100 mg Oral Daily  . chlorhexidine  15 mL Mouth Rinse BID  . Chlorhexidine Gluconate Cloth  6 each Topical Q0600  . cinacalcet  60 mg Oral Q supper  . darbepoetin (ARANESP) injection - DIALYSIS  200 mcg Intravenous Q Mon-HD  . feeding supplement (NEPRO CARB STEADY)  237 mL Oral TID AC  . feeding supplement (PRO-STAT SUGAR FREE 64)  30 mL Oral BID  . ferric citrate  630 mg  Oral TID WC  . mouth rinse  15 mL Mouth Rinse q12n4p  . midodrine  5 mg Oral TID WC  . mirtazapine  7.5 mg Oral QHS  . multivitamin  1 tablet Oral QHS  . pantoprazole  40 mg Oral BID    Dialysis Orders: MWF at [facility] 4hr, 450/800, EDW 111.5kg, 2K/2.25Ca, #4/linear Na, AVF, Hep 4500 + 1500 - Hectoral 8mcg IV q HD - Parsabiv 12.5mg  IV q HD - Mircera 69mcg q 2 weeks  Assessment/Plan: 1. R Pulmonary emboli - s/p IVC filter d/t PE & L DVT.  On heparin to transition for warfarin today per primary.  2. ESRD - MWF schedule.  K 4.1, Orders written for tomorrow per regular schedule.  3. Anemia of CKD- Hgb 8.4. Receiving aranesp 21mcg qwk (Mon). Follow trends.  4. Secondary hyperparathyroidism - CCa 12.2>11.6. pth 217. VDRA d/c due to hypercalcemia.  Continue sensipar.  D/c auryxia due to diarrhea. Change velphoro 3AC tid   5. HTN/volume - BP variable. On midodrine.  Significantly under edw, [name], continue to titrate down volume, will need to establish new EDW prior to d/c. 6. Nutrition - Alb 2.4. Renal diet w/ fluid restrictions, added Nepro. Cont prostat, renavite.  7. L renal hemorrhage/hematoma -  stable per CT 9/11 8. Hx A flutter - on amio 9. NICM (EF 30-35%) 10. severe debill from prolonged illness, he is very depressed  Matthew Mow, PA-C [facility] Pager: [phone] 02/05/2018,9:03 AM  LOS: 10 days  I have seen and examined this patient and agree with the plan of care seen, eval, examined, counseled patient, discussed with PA .  [name] 02/05/2018, 11:03 AM

## 2018-02-05 NOTE — Progress Notes (Signed)
ANTICOAGULATION CONSULT NOTE - Follow Up Consult  Pharmacy Consult for Heparin Indication: pulmonary embolus  Allergies  Allergen Reactions  . Ace Inhibitors Itching and Cough    Patient Measurements: Height: 6\' 1"  (185.4 cm) Weight: 231 lb 4.2 oz (104.9 kg) IBW/kg (Calculated) : 79.9 Heparin Dosing Weight: 102 kg  Vital Signs: Temp: 98.2 F (36.8 C) (09/15 0344) Temp Source: Oral (09/15 0344) BP: 104/73 (09/15 0344) Pulse Rate: 88 (09/15 0344)  Labs: Recent Labs    02/03/18 0421 02/03/18 1225 02/03/18 1903 02/04/18 0628 02/05/18 0637  HGB 7.6*  --  9.3* 8.4* 8.8*  HCT 24.9*  --  30.1* 27.1* 28.8*  PLT 313  --  232 255 269  HEPARINUNFRC 0.35  --   --  0.47 0.42  CREATININE  --  8.84*  --  7.80* 10.16*   Assessment:  44 yom who has known hx of ESRD (MWF), atrial fibrillation and is s/p L nephrectomy for spontaneous hemorrhage in L kidney (01/13/18) in setting of supratherapeutic INR on warfarin. Off anticoagulation since that time.  CTA 01/26/18 found probable R upper lobe PE without R heart strain. IVC filter placed 9/6.  Prior pharmacist discussed with MD and given high bleed risk will be conservative in heparin dosing with reduced goal and no bolus despite known PE.   Heparin level therapeutic (0.42) this morning on 1750 units/hr. Hgb 8.8 today, plt 269. No s/sx of bleeding per nursing.    Goal of Therapy:  Heparin level 0.3-0.5 units/ml (lower end of therapeutic range) Monitor platelets by anticoagulation protocol: Yes   Plan:   Continue Heparin drip at 1750 units/hr  Daily heparin level and CBC.  Monitor for any s/sx bleeding.  Follow up oral anticoagulation plan.  Angus Seller, PharmD Pharmacy Resident Please check AMION for all [place] phone numbers After 10:00 PM, call [place] [phone] 02/05/2018 2:43 PM

## 2018-02-06 LAB — RENAL FUNCTION PANEL
Albumin: 2.5 g/dL — ABNORMAL LOW (ref 3.5–5.0)
Anion gap: 15 (ref 5–15)
BUN: 71 mg/dL — ABNORMAL HIGH (ref 6–20)
CO2: 24 mmol/L (ref 22–32)
Calcium: 11.7 mg/dL — ABNORMAL HIGH (ref 8.9–10.3)
Chloride: 94 mmol/L — ABNORMAL LOW (ref 98–111)
Creatinine, Ser: 12.64 mg/dL — ABNORMAL HIGH (ref 0.61–1.24)
GFR calc Af Amer: 5 mL/min — ABNORMAL LOW (ref >60)
GFR calc non Af Amer: 4 mL/min — ABNORMAL LOW (ref >60)
Glucose, Bld: 77 mg/dL (ref 70–99)
Phosphorus: 4.8 mg/dL — ABNORMAL HIGH (ref 2.5–4.6)
Potassium: 4.3 mmol/L (ref 3.5–5.1)
Sodium: 133 mmol/L — ABNORMAL LOW (ref 135–145)

## 2018-02-06 LAB — CBC
HCT: 26 % — ABNORMAL LOW (ref 39.0–52.0)
Hemoglobin: 8.2 g/dL — ABNORMAL LOW (ref 13.0–17.0)
MCH: 30.6 pg (ref 26.0–34.0)
MCHC: 31.5 g/dL (ref 30.0–36.0)
MCV: 97 fL (ref 78.0–100.0)
Platelets: 275 10*3/uL (ref 150–400)
RBC: 2.68 MIL/uL — ABNORMAL LOW (ref 4.22–5.81)
RDW: 17.1 % — ABNORMAL HIGH (ref 11.5–15.5)
WBC: 9.3 10*3/uL (ref 4.0–10.5)

## 2018-02-06 LAB — PROTIME-INR
INR: 1.27
Prothrombin Time: 15.8 seconds — ABNORMAL HIGH (ref 11.4–15.2)

## 2018-02-06 LAB — HEPARIN LEVEL (UNFRACTIONATED): Heparin Unfractionated: 0.35 IU/mL (ref 0.30–0.70)

## 2018-02-06 MED ORDER — LIDOCAINE-PRILOCAINE 2.5-2.5 % EX CREA: 1.0000 "application " | TOPICAL_CREAM | CUTANEOUS | Status: DC | PRN

## 2018-02-06 MED ORDER — HEPARIN SODIUM (PORCINE) 1000 UNIT/ML DIALYSIS: 1000.0000 [IU] | INTRAMUSCULAR | Status: DC | PRN

## 2018-02-06 MED ORDER — PENTAFLUOROPROP-TETRAFLUOROETH EX AERO: 1.0000 "application " | INHALATION_SPRAY | CUTANEOUS | Status: DC | PRN

## 2018-02-06 MED ORDER — MIDODRINE HCL 5 MG PO TABS
ORAL_TABLET | ORAL | Status: AC
  Filled 2018-02-06: qty 1

## 2018-02-06 MED ORDER — WARFARIN SODIUM 10 MG PO TABS
10.0000 mg | ORAL_TABLET | Freq: Once | ORAL | Status: AC
  Administered 2018-02-06: 10 mg via ORAL
  Filled 2018-02-06: qty 1

## 2018-02-06 MED ORDER — DARBEPOETIN ALFA 200 MCG/0.4ML IJ SOSY
PREFILLED_SYRINGE | INTRAMUSCULAR | Status: AC
  Filled 2018-02-06: qty 0.4

## 2018-02-06 MED ORDER — ALTEPLASE 2 MG IJ SOLR: 2.0000 mg | Freq: Once | INTRAMUSCULAR | Status: DC | PRN

## 2018-02-06 MED ORDER — SODIUM CHLORIDE 0.9 % IV SOLN: 100.0000 mL | INTRAVENOUS | Status: DC | PRN

## 2018-02-06 MED ORDER — HEPARIN SODIUM (PORCINE) 1000 UNIT/ML DIALYSIS: 20.0000 [IU]/kg | INTRAMUSCULAR | Status: DC | PRN

## 2018-02-06 MED ORDER — LIDOCAINE HCL (PF) 1 % IJ SOLN: 5.0000 mL | INTRAMUSCULAR | Status: DC | PRN

## 2018-02-06 NOTE — Progress Notes (Signed)
OT Cancellation Note  Patient Details Name: [name] MRN: [id number] DOB: 1960/12/01   Cancelled Treatment:    Reason Eval/Treat Not Completed: Patient at procedure or test/ unavailable. Pt currently in hemodialysis.  [place], OTR/L Acute Rehab Services Pager [phone] Office [phone]

## 2018-02-06 NOTE — Progress Notes (Signed)
PROGRESS NOTE    [name]  KKX:[id number] DOB: January 07, 1961 DOA: 01/26/2018 PCP: Iona Beard, MD    Brief Narrative:  year-old male with a history of ESRD on hemodialysis Monday Wednesday Friday, hypertension, CVA, GERD, gout, atrial fibrillation not on anticoagulation, systolic CHF with EF 16%, CAD, and status post left nephrectomy for spontaneous hemorrhage in the left kidney in the setting of supratherapeutic INR who came to hospital with worsening shortness of breath.  CTA showed possibility of pulmonary embolism, but pneumonia was also not excluded.  Assessment & Plan:   Principal Problem:   Acute respiratory failure with hypoxia (HCC) Active Problems:   Chronic combined systolic (congestive) and diastolic (congestive) heart failure (HCC)   Gout, chronic   ESRD (end stage renal disease) on dialysis (HCC)   Hemorrhage of left kidney   HCAP (healthcare-associated pneumonia)   CAD (coronary artery disease)   Atrial fibrillation, chronic (HCC)   Depression   GERD (gastroesophageal reflux disease)   Pulmonary embolism (HCC)   Anemia   Acute respiratory failure with hypoxia secondary to pulmonary embolism.  S/p IVC filter placement, start IV heparin, and coumadin, INR still sub therapeutic.  Given recent life threatening hemorrhage , ? Keep the patient till INR  At goal and make sure no evidence of spontaneous bleeding.    H/o chronic systolic and diastolic heart failure:  LVEF is 30 to 35%, volume management as per HD. Pt denies any chest pain or sob.    Osa:  Use CPAP at night.    Anemia of chronic disease:  Resume epogen and Fe as per renal.  Hemoglobin around 8 and stable.    GERD:  Resume PPI.    Health care associated pneumonia  Completed antibiotics.    ESRD on HD  MWF , as per renal service.   GOUT:  No flare up.    Chronic atrial fibrillation: Rate controlled, on amiodarone and IV heparin and coumadin for anti coagulation.   Mild hyponatremia  from ESRD.   DVT prophylaxis: heparin and coumadin.  Code Status: full code.  Family Communication: none at bedside. Disposition Plan: pending clinical improvement and therapeutic INR.    Consultants:   Nephrology.   Procedures: None.   Antimicrobials: None.    Subjective: Occasional dry cough, no chest pain . Reports some loose stools.   Objective: Vitals:   02/04/18 2004 02/05/18 0344 02/05/18 1602 02/05/18 2047  BP: 127/66 104/73 (!) 93/59 127/66  Pulse: 85 88 86 85  Resp: 18 18 20 18   Temp: 98.4 F (36.9 C) 98.2 F (36.8 C) 97.9 F (36.6 C) 98.4 F (36.9 C)  TempSrc: Oral Oral Oral Oral  SpO2: 100% 100% 100% 100%  Weight:      Height:        Intake/Output Summary (Last 24 hours) at 02/06/2018 0904 Last data filed at 02/06/2018 0646 Gross per 24 hour  Intake 982.82 ml  Output 0 ml  Net 982.82 ml   Filed Weights   02/03/18 0505 02/03/18 1157 02/03/18 1616  Weight: 108 kg 108.2 kg 104.9 kg    Examination:  General exam: Appears calm and comfortable no tin distress on RA.  Respiratory system: Clear to auscultation. Respiratory effort normal. Cardiovascular system: S1 & S2 heard, RRR. No JVD, murmurs,  Gastrointestinal system: Abdomen is nondistended, soft and nontender. No organomegaly or masses felt. Normal bowel sounds heard. Central nervous system: Alert and oriented. No focal neurological deficits. Extremities: Symmetric 5 x 5 power. Skin: No rashes, lesions  or ulcers Psychiatry:  Mood & affect appropriate.     Data Reviewed: I have personally reviewed following labs and imaging studies  CBC: Recent Labs  Lab 02/03/18 0421 02/03/18 1903 02/04/18 0628 02/05/18 0637 02/06/18 0440  WBC 12.9* 10.4 10.9* 10.1 9.3  HGB 7.6* 9.3* 8.4* 8.8* 8.2*  HCT 24.9* 30.1* 27.1* 28.8* 26.0*  MCV 98.0 99.3 98.2 98.3 97.0  PLT 313 232 255 269 254   Basic Metabolic Panel: Recent Labs  Lab 01/30/18 1657 01/31/18 0436 02/01/18 0733 02/03/18 1225  02/04/18 0628 02/05/18 0637  NA 135 136 135 134* 134* 133*  K 4.2 4.8 4.4 3.2* 3.4* 4.1  CL 94* 96* 93* 96* 95* 94*  CO2 26 27 23 25 27 23   GLUCOSE 100* 98 77 114* 109* 76  BUN 29* 38* 62* 57* 40* 55*  CREATININE 6.79* 8.22* 10.79* 8.84* 7.80* 10.16*  CALCIUM 9.5 9.8 10.6* 10.1 10.3 11.3*  PHOS 3.6  --  5.9* 3.9  --   --    GFR: Estimated Creatinine Clearance: 10.6 mL/min (A) (by C-G formula based on SCr of 10.16 mg/dL (H)). Liver Function Tests: Recent Labs  Lab 01/30/18 1657 01/31/18 0436 02/01/18 0733 02/03/18 1225 02/04/18 0628  AST  --  44*  --   --  33  ALT  --  25  --   --  24  ALKPHOS  --  93  --   --  82  BILITOT  --  1.1  --   --  1.0  PROT  --  8.2*  --   --  8.2*  ALBUMIN 2.3* 2.3* 2.1* 2.2* 2.4*   No results for input(s): LIPASE, AMYLASE in the last 168 hours. Recent Labs  Lab 02/04/18 0628  AMMONIA 40*   Coagulation Profile: Recent Labs  Lab 02/06/18 0440  INR 1.27   Cardiac Enzymes: No results for input(s): CKTOTAL, CKMB, CKMBINDEX, TROPONINI in the last 168 hours. BNP (last 3 results) No results for input(s): PROBNP in the last 8760 hours. HbA1C: No results for input(s): HGBA1C in the last 72 hours. CBG: Recent Labs  Lab 01/30/18 1412 01/30/18 2239 01/31/18 0057 02/01/18 2044  GLUCAP 91 151* 109* 97   Lipid Profile: No results for input(s): CHOL, HDL, LDLCALC, TRIG, CHOLHDL, LDLDIRECT in the last 72 hours. Thyroid Function Tests: No results for input(s): TSH, T4TOTAL, FREET4, T3FREE, THYROIDAB in the last 72 hours. Anemia Panel: No results for input(s): VITAMINB12, FOLATE, FERRITIN, TIBC, IRON, RETICCTPCT in the last 72 hours. Sepsis Labs: No results for input(s): PROCALCITON, LATICACIDVEN in the last 168 hours.  No results found for this or any previous visit (from the past 240 hour(s)).       Radiology Studies: No results found.      Scheduled Meds: . allopurinol  150 mg Oral QODAY  . amiodarone  100 mg Oral Daily  .  chlorhexidine  15 mL Mouth Rinse BID  . Chlorhexidine Gluconate Cloth  6 each Topical Q0600  . cinacalcet  60 mg Oral Q supper  . darbepoetin (ARANESP) injection - DIALYSIS  200 mcg Intravenous Q Mon-HD  . feeding supplement (NEPRO CARB STEADY)  237 mL Oral TID AC  . feeding supplement (PRO-STAT SUGAR FREE 64)  30 mL Oral BID  . mouth rinse  15 mL Mouth Rinse q12n4p  . midodrine  5 mg Oral TID WC  . mirtazapine  7.5 mg Oral QHS  . multivitamin  1 tablet Oral QHS  . pantoprazole  40  mg Oral BID  . sucroferric oxyhydroxide  1,500 mg Oral TID WC  . warfarin  10 mg Oral ONCE-1800  . Warfarin - Pharmacist Dosing Inpatient   Does not apply q1800   Continuous Infusions: . sodium chloride    . sodium chloride    . heparin 1,750 Units/hr (02/06/18 0557)     LOS: 11 days    Time spent: 35 minutes.     Hosie Poisson, MD [facility] Pager [phone]   If 7PM-7AM, please contact night-coverage [email or web address] Password [email or web address] 02/06/2018, 9:04 AM

## 2018-02-06 NOTE — Progress Notes (Signed)
Pt c/o pain in buttocks. RN admin. Tylenol and asked if pt wanted to be turned on side to get off bottom. Pt refused to be turned, stated he hurts worse on sides.   Eleanora Neighbor, RN

## 2018-02-06 NOTE — Progress Notes (Addendum)
[facility] Progress Note   Subjective:  Seen on HD, 3L UF goal. Improving status overall, only c/o today is pleurtic R chest pain. No CP or dyspnea at rest. No N/V.  Objective Vitals:   02/06/18 0830 02/06/18 0833 02/06/18 0838 02/06/18 0900  BP: 113/81 115/78 108/79 96/67  Pulse: (!) 58 (!) 58 79 (!) 59  Resp: 16     Temp: 98.4 F (36.9 C)     TempSrc: Oral     SpO2: 96%     Weight: 107.5 kg     Height:       Physical Exam General: Well appearing man, NAD. Not requiring nasal oxygen at this time. Heart: RRR; no murmur Lungs: CTA with reduced air movement Extremities: No LE edema Dialysis Access: AVF + thrill  Additional Objective Labs: Basic Metabolic Panel: Recent Labs  Lab 01/30/18 1657  02/01/18 0733 02/03/18 1225 02/04/18 0628 02/05/18 0637  NA 135   < > 135 134* 134* 133*  K 4.2   < > 4.4 3.2* 3.4* 4.1  CL 94*   < > 93* 96* 95* 94*  CO2 26   < > 23 25 27 23   GLUCOSE 100*   < > 77 114* 109* 76  BUN 29*   < > 62* 57* 40* 55*  CREATININE 6.79*   < > 10.79* 8.84* 7.80* 10.16*  CALCIUM 9.5   < > 10.6* 10.1 10.3 11.3*  PHOS 3.6  --  5.9* 3.9  --   --    < > = values in this interval not displayed.   Liver Function Tests: Recent Labs  Lab 01/31/18 0436 02/01/18 0733 02/03/18 1225 02/04/18 0628  AST 44*  --   --  33  ALT 25  --   --  24  ALKPHOS 93  --   --  82  BILITOT 1.1  --   --  1.0  PROT 8.2*  --   --  8.2*  ALBUMIN 2.3* 2.1* 2.2* 2.4*   CBC: Recent Labs  Lab 02/03/18 0421 02/03/18 1903 02/04/18 0628 02/05/18 0637 02/06/18 0440  WBC 12.9* 10.4 10.9* 10.1 9.3  HGB 7.6* 9.3* 8.4* 8.8* 8.2*  HCT 24.9* 30.1* 27.1* 28.8* 26.0*  MCV 98.0 99.3 98.2 98.3 97.0  PLT 313 232 255 269 275   Medications: . sodium chloride    . sodium chloride    . heparin 1,750 Units/hr (02/06/18 0557)   . allopurinol  150 mg Oral QODAY  . amiodarone  100 mg Oral Daily  . chlorhexidine  15 mL Mouth Rinse BID  . Chlorhexidine Gluconate Cloth  6  each Topical Q0600  . cinacalcet  60 mg Oral Q supper  . darbepoetin (ARANESP) injection - DIALYSIS  200 mcg Intravenous Q Mon-HD  . feeding supplement (NEPRO CARB STEADY)  237 mL Oral TID AC  . feeding supplement (PRO-STAT SUGAR FREE 64)  30 mL Oral BID  . mouth rinse  15 mL Mouth Rinse q12n4p  . midodrine  5 mg Oral TID WC  . mirtazapine  7.5 mg Oral QHS  . multivitamin  1 tablet Oral QHS  . pantoprazole  40 mg Oral BID  . sucroferric oxyhydroxide  1,500 mg Oral TID WC  . warfarin  10 mg Oral ONCE-1800  . Warfarin - Pharmacist Dosing Inpatient   Does not apply q1800    Dialysis Orders: MWF at [facility] 4hr, 450/800, EDW 111.5kg, 2K/2.25Ca, #4/linear Na, AVF, Hep 4500 + 1500 - Hectoral 72mcg  IV q HD - Parsabiv 12.5mg  IV q HD - Mircera 49mcg q 2 weeks  Assessment/Plan: 1.R Pulmonary emboli: S/p IVC filter d/t PE &L DVT. On heparin drip & warfarin. 2. ESRD: Continue HD per MWF schedule, HD today. 3L UF goal. 3. Anemia of CKD: Hgb 8.2 - continue Aranesp 259mcg qwk (Mon). Follow trends. 4. Secondary hyperparathyroidism: Corr Ca remains high - possibly d/t immobility? Tremors and AMS resolved. PTH 217. Started on Sensipar 30mcg q HD. VDRA d/c due to hypercalcemia. Binders changed from [place] to [employer] [facility] [employer] d/t diarrhea. 5. HTN/volume -BP variable. On midodrine.  Significantly under edw, Eliane Decree, continue to titrate down volume, will need to establish new EDW prior to d/c. 6. Nutrition: Alb 2.4. Continue Nepro and Pro-stat supps. 7. L renal hemorrhage/hematoma - stable per CT 9/11 8. Hx A flutter - on amio 9. NICM (EF 30-35%) 10. Severe debility from prolonged illness +/- mild depression  Veneta Penton, PA-C 02/06/2018, 9:22 AM  [facility] Pager: [phone]  Pt seen, examined and agree w A/P as above.  Kelly Splinter MD [employer] pager [phone]   02/06/2018, 12:47 PM

## 2018-02-06 NOTE — Progress Notes (Signed)
PT Cancellation Note  Patient Details Name: [name] MRN: [id number] DOB: May 21, 1961   Cancelled Treatment:    Reason Eval/Treat Not Completed: Patient at procedure or test/unavailable   Currently in HD;  Will reattempt,   Roney Marion, PT  Acute Rehabilitation Services Pager [phone] Office [phone]    [place] 02/06/2018, 8:34 AM

## 2018-02-06 NOTE — Progress Notes (Signed)
ANTICOAGULATION CONSULT NOTE - Follow Up Consult  Pharmacy Consult for Heparin and Warfarin Indication: pulmonary embolus  Allergies  Allergen Reactions  . Ace Inhibitors Itching and Cough    Patient Measurements: Height: 6\' 1"  (185.4 cm) Weight: 231 lb 4.2 oz (104.9 kg) IBW/kg (Calculated) : 79.9 Heparin Dosing Weight: 102 kg  Vital Signs: Temp: 98.4 F (36.9 C) (09/15 2047) Temp Source: Oral (09/15 2047) BP: 127/66 (09/15 2047) Pulse Rate: 85 (09/15 2047)  Labs: Recent Labs    02/03/18 1225  02/04/18 0628 02/05/18 0637 02/06/18 0440  HGB  --    < > 8.4* 8.8* 8.2*  HCT  --    < > 27.1* 28.8* 26.0*  PLT  --    < > 255 269 275  LABPROT  --   --   --   --  15.8*  INR  --   --   --   --  1.27  HEPARINUNFRC  --   --  0.47 0.42 0.35  CREATININE 8.84*  --  7.80* 10.16*  --    < > = values in this interval not displayed.   Assessment:  59 yom who has known hx of ESRD (MWF), atrial fibrillation and is s/p L nephrectomy for spontaneous hemorrhage in L kidney (01/13/18) in setting of supratherapeutic INR on warfarin. Off anticoagulation since that time.  CTA 01/26/18 found probable R upper lobe PE without R heart strain. IVC filter placed 9/6.  Prior pharmacist discussed with MD and given high bleed risk will be conservative in heparin dosing with reduced goal and no bolus despite known PE. Warfarin resumed 9/15 PM.  Heparin level this morning remains therapeutic (HL 0.35 << 0.42, goal of 0.3-0.5). INR remains SUBtherapeutic as expected after just resuming (INR 1.27, goal of 2-3). Hgb 8.2 << 8.8, plts wnl. No bleeding noted at this time.    Goal of Therapy:  Heparin level 0.3-0.5 units/ml (lower end of therapeutic range) Monitor platelets by anticoagulation protocol: Yes   Plan:  Repeat Warfarin 10 mg tonight x 1 Continue Heparin drip at 1750 units/hr Daily heparin level, INR, and CBC. Monitor for any s/sx bleeding.  Thank you for allowing pharmacy to be a part of this  patient's care.  Alycia Rossetti, PharmD, BCPS Clinical Pharmacist Pager: [phone] Clinical phone for 02/06/2018 from 7a-3:30p: [phone] If after 3:30p, please call main pharmacy at: [phone] Please check AMION for all [place] numbers 02/06/2018 8:18 AM

## 2018-02-07 LAB — CBC
HCT: 27.7 % — ABNORMAL LOW (ref 39.0–52.0)
Hemoglobin: 8.6 g/dL — ABNORMAL LOW (ref 13.0–17.0)
MCH: 30.5 pg (ref 26.0–34.0)
MCHC: 31 g/dL (ref 30.0–36.0)
MCV: 98.2 fL (ref 78.0–100.0)
Platelets: 249 10*3/uL (ref 150–400)
RBC: 2.82 MIL/uL — ABNORMAL LOW (ref 4.22–5.81)
RDW: 17.3 % — ABNORMAL HIGH (ref 11.5–15.5)
WBC: 10 10*3/uL (ref 4.0–10.5)

## 2018-02-07 LAB — PROTIME-INR
INR: 1.34
Prothrombin Time: 16.5 seconds — ABNORMAL HIGH (ref 11.4–15.2)

## 2018-02-07 LAB — HEPARIN LEVEL (UNFRACTIONATED): Heparin Unfractionated: 0.44 IU/mL (ref 0.30–0.70)

## 2018-02-07 MED ORDER — WARFARIN SODIUM 2.5 MG PO TABS
12.5000 mg | ORAL_TABLET | Freq: Once | ORAL | Status: AC
  Administered 2018-02-07: 12.5 mg via ORAL
  Filled 2018-02-07: qty 1

## 2018-02-07 MED ORDER — LOPERAMIDE HCL 2 MG PO CAPS: 2.0000 mg | ORAL_CAPSULE | ORAL | Status: DC | PRN

## 2018-02-07 MED ORDER — CINACALCET HCL 30 MG PO TABS
90.0000 mg | ORAL_TABLET | Freq: Every day | ORAL | Status: DC
  Administered 2018-02-07 – 2018-02-10 (×4): 90 mg via ORAL
  Filled 2018-02-07 (×4): qty 3

## 2018-02-07 NOTE — Evaluation (Signed)
Physical Therapy Evaluation Patient Details Name: [name] MRN: [id number] DOB: [birth year] Today's Date: 02/07/2018   History of Present Illness  57 year old male with a history of ESRD on hemodialysis Monday Wednesday Friday, hypertension, CVA, GERD, gout, atrial fibrillation not on anticoagulation, systolic CHF with EF 81%, CAD, and status post left nephrectomy for spontaneous and life threatening hemorrhage in the left kidney in the setting of supratherapeutic INR who came to hospital with worsening shortness of breath; Found PE, IVC filter placed  Clinical Impression   Pt admitted with above diagnosis. Pt currently with functional limitations due to the deficits listed below (see PT Problem List). Independent Prior to admission; Presents with decr activity tolerance, mild unsteadiness on feet; Worht considering RW for steadiness with amb, especially after HD;  Pt will benefit from skilled PT to increase their independence and safety with mobility to allow discharge to the venue listed below.       Follow Up Recommendations No PT follow up    Equipment Recommendations  Rolling walker with 5" wheels    Recommendations for Other Services       Precautions / Restrictions Precautions Precaution Comments: slight fall risk Restrictions Weight Bearing Restrictions: No      Mobility  Bed Mobility Overal bed mobility: Modified Independent                Transfers Overall transfer level: Modified independent Equipment used: None Transfers: Sit to/from Stand Sit to Stand: Modified independent (Device/Increase time)         General transfer comment: No difficulty noted  Ambulation/Gait Ambulation/Gait assistance: Supervision Gait Distance (Feet): 85 Feet Assistive device: Rolling walker (2 wheeled);IV Pole       General Gait Details: Mild unsteadiness where he did depend on UE support with amb; more steady with RW  Stairs            Wheelchair Mobility     Modified Rankin (Stroke Patients Only)       Balance                                             Pertinent Vitals/Pain Pain Assessment: Faces Faces Pain Scale: Hurts a little bit Pain Location: buttocks Pain Descriptors / Indicators: (raw from loose stools recently)    Home Living Family/patient expects to be discharged to:: Private residence Living Arrangements: Spouse/significant other Available Help at Discharge: Family;Available 24 hours/day Type of Home: House Home Access: Stairs to enter Entrance Stairs-Rails: Right Entrance Stairs-Number of Steps: 2 Home Layout: One level Home Equipment: Walker - 2 wheels;Crutches      Prior Function Level of Independence: Independent         Comments: drives himself to dialysis; enjoys mowing lawns     Hand Dominance   Dominant Hand: Right    Extremity/Trunk Assessment   Upper Extremity Assessment Upper Extremity Assessment: Defer to OT evaluation    Lower Extremity Assessment Lower Extremity Assessment: Generalized weakness    Cervical / Trunk Assessment Cervical / Trunk Assessment: Normal  Communication   Communication: No difficulties  Cognition Arousal/Alertness: Awake/alert Behavior During Therapy: WFL for tasks assessed/performed Overall Cognitive Status: Within Functional Limits for tasks assessed  General Comments      Exercises     Assessment/Plan    PT Assessment Patient needs continued PT services  PT Problem List Decreased strength;Decreased activity tolerance;Decreased balance;Decreased mobility       PT Treatment Interventions DME instruction;Therapeutic activities;Gait training;Therapeutic exercise;Balance training;Functional mobility training;Patient/family education    PT Goals (Current goals can be found in the Care Plan section)  Acute Rehab PT Goals Patient Stated Goal: to get stronger PT Goal  Formulation: With patient Time For Goal Achievement: 02/21/18 Potential to Achieve Goals: Good    Frequency Min 3X/week   Barriers to discharge        Co-evaluation               AM-PAC PT "6 Clicks" Daily Activity  Outcome Measure Difficulty turning over in bed (including adjusting bedclothes, sheets and blankets)?: None Difficulty moving from lying on back to sitting on the side of the bed? : None Difficulty sitting down on and standing up from a chair with arms (e.g., wheelchair, bedside commode, etc,.)?: None Help needed moving to and from a bed to chair (including a wheelchair)?: A Little Help needed walking in hospital room?: A Little Help needed climbing 3-5 steps with a railing? : A Little 6 Click Score: 21    End of Session   Activity Tolerance: Patient tolerated treatment well Patient left: in chair;with call bell/phone within reach Nurse Communication: Mobility status PT Visit Diagnosis: Unsteadiness on feet (R26.81);Muscle weakness (generalized) (M62.81)    Time: 1937-9024 PT Time Calculation (min) (ACUTE ONLY): 25 min   Charges:   PT Evaluation $PT Eval Low Complexity: 1 Low PT Treatments $Gait Training: 8-22 mins        Roney Marion, PT  Acute Rehabilitation Services Pager [phone] Office [place] 02/07/2018, 1:51 PM

## 2018-02-07 NOTE — Progress Notes (Addendum)
[name] Progress Note   Subjective: Looks improved today. No CP currently, no dyspnea. Asking about working with PT.    Objective Vitals:   02/06/18 1257 02/06/18 2353 02/07/18 0516 02/07/18 0822  BP: 128/62 103/61 (!) 138/59 (!) 127/58  Pulse: 79 76 77 73  Resp: 18 20 18 18   Temp: 98.7 F (37.1 C) 98.4 F (36.9 C) 98.2 F (36.8 C) 98 F (36.7 C)  TempSrc: Oral Oral Oral Oral  SpO2: 100% 98% 99% 99%  Weight:      Height:       Physical Exam General: Well appearing man, NAD. Not requiring nasal oxygen at this time. Heart: RRR; no murmur Lungs: CTA with reduced air movement Extremities: No LE edema Dialysis Access: AVF + thrill  Additional Objective Labs: Basic Metabolic Panel: Recent Labs  Lab 02/01/18 0733 02/03/18 1225 02/04/18 0628 02/05/18 0637 02/06/18 0842  NA 135 134* 134* 133* 133*  K 4.4 3.2* 3.4* 4.1 4.3  CL 93* 96* 95* 94* 94*  CO2 23 25 27 23 24   GLUCOSE 77 114* 109* 76 77  BUN 62* 57* 40* 55* 71*  CREATININE 10.79* 8.84* 7.80* 10.16* 12.64*  CALCIUM 10.6* 10.1 10.3 11.3* 11.7*  PHOS 5.9* 3.9  --   --  4.8*   Liver Function Tests: Recent Labs  Lab 02/03/18 1225 02/04/18 0628 02/06/18 0842  AST  --  33  --   ALT  --  24  --   ALKPHOS  --  82  --   BILITOT  --  1.0  --   PROT  --  8.2*  --   ALBUMIN 2.2* 2.4* 2.5*   CBC: Recent Labs  Lab 02/03/18 1903 02/04/18 0628 02/05/18 0637 02/06/18 0440 02/07/18 0513  WBC 10.4 10.9* 10.1 9.3 10.0  HGB 9.3* 8.4* 8.8* 8.2* 8.6*  HCT 30.1* 27.1* 28.8* 26.0* 27.7*  MCV 99.3 98.2 98.3 97.0 98.2  PLT 232 255 269 275 249   Medications: . heparin 1,750 Units/hr (02/06/18 2225)   . allopurinol  150 mg Oral QODAY  . amiodarone  100 mg Oral Daily  . chlorhexidine  15 mL Mouth Rinse BID  . Chlorhexidine Gluconate Cloth  6 each Topical Q0600  . cinacalcet  60 mg Oral Q supper  . darbepoetin (ARANESP) injection - DIALYSIS  200 mcg Intravenous Q Mon-HD  . feeding supplement  (NEPRO CARB STEADY)  237 mL Oral TID AC  . feeding supplement (PRO-STAT SUGAR FREE 64)  30 mL Oral BID  . mouth rinse  15 mL Mouth Rinse q12n4p  . midodrine  5 mg Oral TID WC  . mirtazapine  7.5 mg Oral QHS  . multivitamin  1 tablet Oral QHS  . pantoprazole  40 mg Oral BID  . sucroferric oxyhydroxide  1,500 mg Oral TID WC  . Warfarin - Pharmacist Dosing Inpatient   Does not apply q1800    Dialysis Orders: MWF at [facility] 4hr, 450/800, EDW 111.5kg, 2K/2.25Ca, #4/linear Na, AVF, Hep 4500 + 1500 - Hectoral 14mcg IV q HD - Parsabiv 12.5mg  IV q HD - Mircera 49mcg q 2 weeks  Assessment/Plan: 1.R Pulmonary emboli: S/p IVC filter d/t PE &L DVT. On heparin drip & warfarin. 2. ESRD: Continue HD per MWF schedule, s/p 3L UF yesterday. 3. Anemia of CKD: Hgb 8.6 - continue Aranesp 245mcg qwk (Mon). Follow trends. 4. Secondary hyperparathyroidism: Corr Ca higher (12.9) - possibly d/t immobility? Tremors and AMS resolved. PTH 217. Started on Sensipar 60mg  q  HD - ^ dose to 90mg . VDRA d/c due to hypercalcemia. Binders changed from [place] to [facility] d/t diarrhea. If goes higher tomorrow, will give calcitonin. 5. HTN/volume -BP variable. On midodrine. Significantly under edw, Eliane Decree, continue to titrate down volume,will need to establish new EDW prior to d/c. 6. Nutrition: Alb 2.5. Continue Nepro and Pro-stat supps. 7. L renal hemorrhage/hematoma - stable per CT 9/11 8. Hx A flutter - on amiodarone 9. 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  Veneta Penton, PA-C 02/07/2018, 10:38 AM  [facility] Pager: [phone]  Pt seen, examined and agree w A/P as above.  Kelly Splinter MD [employer] pager [phone]   02/07/2018, 12:38 PM

## 2018-02-07 NOTE — Progress Notes (Signed)
ANTICOAGULATION CONSULT NOTE - Follow Up Consult  Pharmacy Consult for Heparin and Warfarin Indication: pulmonary embolus  Allergies  Allergen Reactions  . Ace Inhibitors Itching and Cough    Patient Measurements: Height: 6\' 1"  (185.4 cm) Weight: 231 lb 7.7 oz (105 kg) IBW/kg (Calculated) : 79.9 Heparin Dosing Weight: 102 kg  Vital Signs: Temp: 98 F (36.7 C) (09/17 0822) Temp Source: Oral (09/17 0822) BP: 127/58 (09/17 0822) Pulse Rate: 73 (09/17 0822)  Labs: Recent Labs    02/05/18 9024 02/06/18 0440 02/06/18 0842 02/07/18 0513  HGB 8.8* 8.2*  --  8.6*  HCT 28.8* 26.0*  --  27.7*  PLT 269 275  --  249  LABPROT  --  15.8*  --  16.5*  INR  --  1.27  --  1.34  HEPARINUNFRC 0.42 0.35  --  0.44  CREATININE 10.16*  --  12.64*  --    Assessment:  52 yom who has known hx of ESRD (MWF), atrial fibrillation and is s/p L nephrectomy for spontaneous hemorrhage in L kidney (01/13/18) in setting of supratherapeutic INR on warfarin. Off anticoagulation since that time.  CTA 01/26/18 found probable R upper lobe PE without R heart strain. IVC filter placed 9/6.  Prior pharmacist discussed with MD and given high bleed risk will be conservative in heparin dosing with reduced goal and no bolus despite known PE. Warfarin resumed 9/15 PM.  Heparin level this morning remains therapeutic (HL 0.44 << 0.35, goal of 0.3-0.5). INR remains SUBtherapeutic as expected after just resuming (INR 1.34 << 1.27, goal of 2-3). Hgb 8.6 << 8.2, plts wnl. No bleeding noted at this time.    Goal of Therapy:  Heparin level 0.3-0.5 units/ml (lower end of therapeutic range) Monitor platelets by anticoagulation protocol: Yes   Plan:  - Warfarin 12.5 mg x 1 dose at 1800 today - Continue Heparin drip at 1750 units/hr - Daily heparin level, INR, and CBC. - Monitor for any s/sx bleeding.  Thank you for allowing pharmacy to be a part of this patient's care.  Alycia Rossetti, PharmD, BCPS Clinical  Pharmacist Pager: [phone] Clinical phone for 02/07/2018 from 7a-3:30p: [phone] If after 3:30p, please call main pharmacy at: [phone] Please check AMION for all [place] numbers 02/07/2018 10:49 AM

## 2018-02-07 NOTE — Progress Notes (Signed)
PROGRESS NOTE    [name]  OFB:[id number] DOB: 1960-11-15 DOA: 01/26/2018 PCP: Iona Beard, MD    Brief Narrative:  year-old male with a history of ESRD on hemodialysis Monday Wednesday Friday, hypertension, CVA, GERD, gout, atrial fibrillation not on anticoagulation, systolic CHF with EF 78%, CAD, and status post left nephrectomy for spontaneous hemorrhage in the left kidney in the setting of supratherapeutic INR who came to hospital with worsening shortness of breath.  CTA showed possibility of pulmonary embolism, but pneumonia was also not excluded.  Assessment & Plan:   Principal Problem:   Acute respiratory failure with hypoxia (HCC) Active Problems:   Chronic combined systolic (congestive) and diastolic (congestive) heart failure (HCC)   Gout, chronic   ESRD (end stage renal disease) on dialysis (HCC)   Hemorrhage of left kidney   HCAP (healthcare-associated pneumonia)   CAD (coronary artery disease)   Atrial fibrillation, chronic (HCC)   Depression   GERD (gastroesophageal reflux disease)   Pulmonary embolism (HCC)   Anemia   Acute respiratory failure with hypoxia secondary to pulmonary embolism.  S/p IVC filter placement, start IV heparin, and coumadin, INR still sub therapeutic. Its 1.3. Given recent life threatening hemorrhage , ? Keep the patient till INR  At goal and make sure no evidence of spontaneous bleeding.    H/o chronic systolic and diastolic heart failure:  LVEF is 30 to 35%, volume management as per HD. Pt denies any chest pain or sob.    Osa:  Use CPAP at night.    Anemia of chronic disease:  Resume epogen and Fe as per renal.  Hemoglobin around 8 and stable.    GERD:  Resume PPI.    Health care associated pneumonia  Completed antibiotics. No sob or chest pain.    ESRD on HD  MWF , as per renal service.   GOUT:  No flare up.    Chronic atrial fibrillation: Rate controlled, on amiodarone and IV heparin and coumadin for anti  coagulation.   Mild hyponatremia from ESRD.    Pt reports loose stools from Midodrine.  Add imodium prn.   DVT prophylaxis: heparin and coumadin.  Code Status: full code.  Family Communication: none at bedside. Disposition Plan: pending clinical improvement and therapeutic INR.    Consultants:   Nephrology.   Procedures: None.   Antimicrobials: None.    Subjective: Reports loose stools from midodrine. No chest pain or sob.   Objective: Vitals:   02/06/18 2353 02/07/18 0516 02/07/18 0822 02/07/18 1654  BP: 103/61 (!) 138/59 (!) 127/58 (!) 127/58  Pulse: 76 77 73 74  Resp: 20 18 18 18   Temp: 98.4 F (36.9 C) 98.2 F (36.8 C) 98 F (36.7 C) 97.7 F (36.5 C)  TempSrc: Oral Oral Oral Oral  SpO2: 98% 99% 99% 100%  Weight:      Height:        Intake/Output Summary (Last 24 hours) at 02/07/2018 1853 Last data filed at 02/07/2018 1800 Gross per 24 hour  Intake 984.25 ml  Output -  Net 984.25 ml   Filed Weights   02/03/18 1616 02/06/18 0830 02/06/18 1233  Weight: 104.9 kg 107.5 kg 105 kg    Examination:  General exam:calm and comfortable, on RA.  Respiratory system: Clear to auscultation. Respiratory effort normal. No wheezing or rhonchi.  Cardiovascular system: S1 & S2 heard, RRR. No JVD, murmurs,  Gastrointestinal system: Abdomen is nondistended, soft and nontender. Bowel sounds heard.  Central nervous system: Alert and oriented.  Non focal.  Extremities: no pedal edema.  Skin: MASD over the sacrum.  Psychiatry:  Mood & affect appropriate.     Data Reviewed: I have personally reviewed following labs and imaging studies  CBC: Recent Labs  Lab 02/03/18 1903 02/04/18 0628 02/05/18 0637 02/06/18 0440 02/07/18 0513  WBC 10.4 10.9* 10.1 9.3 10.0  HGB 9.3* 8.4* 8.8* 8.2* 8.6*  HCT 30.1* 27.1* 28.8* 26.0* 27.7*  MCV 99.3 98.2 98.3 97.0 98.2  PLT 232 255 269 275 161   Basic Metabolic Panel: Recent Labs  Lab 02/01/18 0733 02/03/18 1225 02/04/18 0628  02/05/18 0637 02/06/18 0842  NA 135 134* 134* 133* 133*  K 4.4 3.2* 3.4* 4.1 4.3  CL 93* 96* 95* 94* 94*  CO2 23 25 27 23 24   GLUCOSE 77 114* 109* 76 77  BUN 62* 57* 40* 55* 71*  CREATININE 10.79* 8.84* 7.80* 10.16* 12.64*  CALCIUM 10.6* 10.1 10.3 11.3* 11.7*  PHOS 5.9* 3.9  --   --  4.8*   GFR: Estimated Creatinine Clearance: 8.5 mL/min (A) (by C-G formula based on SCr of 12.64 mg/dL (H)). Liver Function Tests: Recent Labs  Lab 02/01/18 0733 02/03/18 1225 02/04/18 0628 02/06/18 0842  AST  --   --  33  --   ALT  --   --  24  --   ALKPHOS  --   --  82  --   BILITOT  --   --  1.0  --   PROT  --   --  8.2*  --   ALBUMIN 2.1* 2.2* 2.4* 2.5*   No results for input(s): LIPASE, AMYLASE in the last 168 hours. Recent Labs  Lab 02/04/18 0628  AMMONIA 40*   Coagulation Profile: Recent Labs  Lab 02/06/18 0440 02/07/18 0513  INR 1.27 1.34   Cardiac Enzymes: No results for input(s): CKTOTAL, CKMB, CKMBINDEX, TROPONINI in the last 168 hours. BNP (last 3 results) No results for input(s): PROBNP in the last 8760 hours. HbA1C: No results for input(s): HGBA1C in the last 72 hours. CBG: Recent Labs  Lab 02/01/18 2044  GLUCAP 97   Lipid Profile: No results for input(s): CHOL, HDL, LDLCALC, TRIG, CHOLHDL, LDLDIRECT in the last 72 hours. Thyroid Function Tests: No results for input(s): TSH, T4TOTAL, FREET4, T3FREE, THYROIDAB in the last 72 hours. Anemia Panel: No results for input(s): VITAMINB12, FOLATE, FERRITIN, TIBC, IRON, RETICCTPCT in the last 72 hours. Sepsis Labs: No results for input(s): PROCALCITON, LATICACIDVEN in the last 168 hours.  No results found for this or any previous visit (from the past 240 hour(s)).       Radiology Studies: No results found.      Scheduled Meds: . allopurinol  150 mg Oral QODAY  . amiodarone  100 mg Oral Daily  . chlorhexidine  15 mL Mouth Rinse BID  . Chlorhexidine Gluconate Cloth  6 each Topical Q0600  . cinacalcet  90  mg Oral Q supper  . darbepoetin (ARANESP) injection - DIALYSIS  200 mcg Intravenous Q Mon-HD  . feeding supplement (NEPRO CARB STEADY)  237 mL Oral TID AC  . feeding supplement (PRO-STAT SUGAR FREE 64)  30 mL Oral BID  . mouth rinse  15 mL Mouth Rinse q12n4p  . midodrine  5 mg Oral TID WC  . mirtazapine  7.5 mg Oral QHS  . multivitamin  1 tablet Oral QHS  . pantoprazole  40 mg Oral BID  . sucroferric oxyhydroxide  1,500 mg Oral TID WC  .  Warfarin - Pharmacist Dosing Inpatient   Does not apply q1800   Continuous Infusions: . heparin 1,750 Units/hr (02/07/18 1313)     LOS: 12 days    Time spent: 35 minutes.     Hosie Poisson, MD [facility] Pager [phone]   If 7PM-7AM, please contact night-coverage [email or web address] Password [facility] 02/07/2018, 6:53 PM

## 2018-02-08 LAB — RENAL FUNCTION PANEL
Albumin: 2.6 g/dL — ABNORMAL LOW (ref 3.5–5.0)
Anion gap: 15 (ref 5–15)
BUN: 50 mg/dL — ABNORMAL HIGH (ref 6–20)
CO2: 26 mmol/L (ref 22–32)
Calcium: 11.1 mg/dL — ABNORMAL HIGH (ref 8.9–10.3)
Chloride: 90 mmol/L — ABNORMAL LOW (ref 98–111)
Creatinine, Ser: 11.38 mg/dL — ABNORMAL HIGH (ref 0.61–1.24)
GFR calc Af Amer: 5 mL/min — ABNORMAL LOW (ref >60)
GFR calc non Af Amer: 4 mL/min — ABNORMAL LOW (ref >60)
Glucose, Bld: 75 mg/dL (ref 70–99)
Phosphorus: 4.6 mg/dL (ref 2.5–4.6)
Potassium: 3.9 mmol/L (ref 3.5–5.1)
Sodium: 131 mmol/L — ABNORMAL LOW (ref 135–145)

## 2018-02-08 LAB — PROTIME-INR
INR: 1.48
Prothrombin Time: 17.8 seconds — ABNORMAL HIGH (ref 11.4–15.2)

## 2018-02-08 LAB — CBC
HCT: 28.7 % — ABNORMAL LOW (ref 39.0–52.0)
Hemoglobin: 9 g/dL — ABNORMAL LOW (ref 13.0–17.0)
MCH: 30.5 pg (ref 26.0–34.0)
MCHC: 31.4 g/dL (ref 30.0–36.0)
MCV: 97.3 fL (ref 78.0–100.0)
Platelets: 281 10*3/uL (ref 150–400)
RBC: 2.95 MIL/uL — ABNORMAL LOW (ref 4.22–5.81)
RDW: 17.2 % — ABNORMAL HIGH (ref 11.5–15.5)
WBC: 9.3 10*3/uL (ref 4.0–10.5)

## 2018-02-08 LAB — HEPARIN LEVEL (UNFRACTIONATED): Heparin Unfractionated: 0.39 IU/mL (ref 0.30–0.70)

## 2018-02-08 MED ORDER — ZOLPIDEM TARTRATE 5 MG PO TABS
5.0000 mg | ORAL_TABLET | Freq: Every evening | ORAL | Status: DC | PRN
  Administered 2018-02-09 – 2018-02-10 (×3): 5 mg via ORAL
  Filled 2018-02-08 (×3): qty 1

## 2018-02-08 MED ORDER — MIDODRINE HCL 5 MG PO TABS
ORAL_TABLET | ORAL | Status: AC
  Filled 2018-02-08: qty 1

## 2018-02-08 MED ORDER — WARFARIN SODIUM 2.5 MG PO TABS
12.5000 mg | ORAL_TABLET | Freq: Once | ORAL | Status: AC
  Administered 2018-02-08: 12.5 mg via ORAL
  Filled 2018-02-08: qty 1

## 2018-02-08 NOTE — Progress Notes (Signed)
ANTICOAGULATION CONSULT NOTE - Follow Up Consult  Pharmacy Consult for Heparin and Warfarin Indication: pulmonary embolus  Allergies  Allergen Reactions  . Ace Inhibitors Itching and Cough    Patient Measurements: Height: 6\' 1"  (185.4 cm) Weight: 234 lb 12.6 oz (106.5 kg) IBW/kg (Calculated) : 79.9 Heparin Dosing Weight: 102 kg  Vital Signs: Temp: 98.1 F (36.7 C) (09/18 0810) Temp Source: Oral (09/18 0810) BP: 109/74 (09/18 0830) Pulse Rate: 75 (09/18 0830)  Labs: Recent Labs    02/06/18 0440 02/06/18 0842 02/07/18 0513 02/08/18 0515  HGB 8.2*  --  8.6* 9.0*  HCT 26.0*  --  27.7* 28.7*  PLT 275  --  249 281  LABPROT 15.8*  --  16.5* 17.8*  INR 1.27  --  1.34 1.48  HEPARINUNFRC 0.35  --  0.44 0.39  CREATININE  --  12.64*  --   --    Assessment:  37 yom who has known hx of ESRD (MWF), atrial fibrillation and is s/p L nephrectomy for spontaneous hemorrhage in L kidney (01/13/18) in setting of supratherapeutic INR on warfarin. Off anticoagulation since that time.  CTA 01/26/18 found probable R upper lobe PE without R heart strain. IVC filter placed 9/6.  Prior pharmacist discussed with MD and given high bleed risk will be conservative in heparin dosing with reduced goal and no bolus despite known PE. Warfarin resumed 9/15 PM.  Heparin level this morning remains therapeutic (HL 0.39, goal of 0.3-0.5). INR remains SUBtherapeutic as expected after just resuming (INR 1.48<<1.34, goal of 2-3). Hgb 9 << 8.2, plts wnl. No bleeding noted at this time.    Goal of Therapy:  Heparin level 0.3-0.5 units/ml (lower end of therapeutic range) Monitor platelets by anticoagulation protocol: Yes   Plan:  - Warfarin 12.5 mg x 1 dose at 1800 today - Continue Heparin drip at 1750 units/hr - Daily heparin level, INR, and CBC. - Monitor for any s/sx bleeding.   [name], PharmD, [place] Pager: [phone] Please utilize Amion for appropriate phone  number to reach the unit pharmacist ([place])   02/08/2018 8:56 AM

## 2018-02-08 NOTE — Progress Notes (Addendum)
Ref: Iona Beard, MD   Subjective:  Patient seen per request by patient. He is s/p PE, s/p IVC retrievable filter and left lower leg DVT. His PT/INR is improving to 1.48. So far he does not have recurrence of left renal bleed. He is tolerating hemodialysis today. In past he has needed very high dose of warfarin to reach therapeutic INR levels of 2-3.  Objective:  Vital Signs in the last 24 hours: Temp:  [97.7 F (36.5 C)-98.4 F (36.9 C)] 98.1 F (36.7 C) (09/18 0810) Pulse Rate:  [74-85] 75 (09/18 0830) Resp:  [18] 18 (09/18 0810) BP: (109-146)/(58-77) 109/74 (09/18 0830) SpO2:  [99 %-100 %] 99 % (09/18 0610) Weight:  [106.5 kg] 106.5 kg (09/18 0810)  Physical Exam: BP Readings from Last 1 Encounters:  02/08/18 109/74     Wt Readings from Last 1 Encounters:  02/08/18 106.5 kg    Weight change:  Body mass index is 30.98 kg/m. HEENT: [place]/AT, Eyes-Brown, PERL, EOMI, Conjunctiva-Pale, Sclera-Non-icteric Neck: No JVD, No bruit, Trachea midline. Lungs:  Clear, Bilateral. Cardiac:  Regular rhythm, normal S1 and S2, no S3. II/VI systolic murmur. Abdomen:  Soft, non-tender. BS present. Extremities:  No edema present. No cyanosis. No clubbing. Left UE AVF.  CNS: AxOx3, Cranial nerves grossly intact, moves all 4 extremities.  Skin: Warm and dry.   Intake/Output from previous day: 09/17 0701 - 09/18 0700 In: 984.3 [P.O.:720; I.V.:264.3] Out: -     Lab Results: BMET    Component Value Date/Time   NA 133 (L) 02/06/2018 0842   NA 133 (L) 02/05/2018 0637   NA 134 (L) 02/04/2018 0628   K 4.3 02/06/2018 0842   K 4.1 02/05/2018 0637   K 3.4 (L) 02/04/2018 0628   CL 94 (L) 02/06/2018 0842   CL 94 (L) 02/05/2018 0637   CL 95 (L) 02/04/2018 0628   CO2 24 02/06/2018 0842   CO2 23 02/05/2018 0637   CO2 27 02/04/2018 0628   GLUCOSE 77 02/06/2018 0842   GLUCOSE 76 02/05/2018 0637   GLUCOSE 109 (H) 02/04/2018 0628   BUN 71 (H) 02/06/2018 0842   BUN 55 (H) 02/05/2018 0637   BUN  40 (H) 02/04/2018 0628   CREATININE 12.64 (H) 02/06/2018 0842   CREATININE 10.16 (H) 02/05/2018 0637   CREATININE 7.80 (H) 02/04/2018 0628   CALCIUM 11.7 (H) 02/06/2018 0842   CALCIUM 11.3 (H) 02/05/2018 0637   CALCIUM 10.3 02/04/2018 0628   GFRNONAA 4 (L) 02/06/2018 0842   GFRNONAA 5 (L) 02/05/2018 0637   GFRNONAA 7 (L) 02/04/2018 0628   GFRAA 5 (L) 02/06/2018 0842   GFRAA 6 (L) 02/05/2018 0637   GFRAA 8 (L) 02/04/2018 0628   CBC    Component Value Date/Time   WBC 9.3 02/08/2018 0515   RBC 2.95 (L) 02/08/2018 0515   HGB 9.0 (L) 02/08/2018 0515   HCT 28.7 (L) 02/08/2018 0515   PLT 281 02/08/2018 0515   MCV 97.3 02/08/2018 0515   MCH 30.5 02/08/2018 0515   MCHC 31.4 02/08/2018 0515   RDW 17.2 (H) 02/08/2018 0515   LYMPHSABS 0.8 01/21/2018 0936   MONOABS 1.2 (H) 01/21/2018 0936   EOSABS 0.2 01/21/2018 0936   BASOSABS 0.1 01/21/2018 0936   HEPATIC Function Panel Recent Labs    01/28/18 0256 01/31/18 0436 02/04/18 0628  PROT 7.4 8.2* 8.2*   HEMOGLOBIN A1C No components found for: HGA1C,  MPG CARDIAC ENZYMES Lab Results  Component Value Date   CKTOTAL 136 03/16/2017  CKMB 4.3 (H) 05/20/2011   TROPONINI 0.20 (HH) 01/13/2018   TROPONINI 0.21 (HH) 01/13/2018   TROPONINI 0.27 (HH) 01/13/2018   BNP No results for input(s): PROBNP in the last 8760 hours. TSH No results for input(s): TSH in the last 8760 hours. CHOLESTEROL No results for input(s): CHOL in the last 8760 hours.  Scheduled Meds: . allopurinol  150 mg Oral QODAY  . amiodarone  100 mg Oral Daily  . chlorhexidine  15 mL Mouth Rinse BID  . Chlorhexidine Gluconate Cloth  6 each Topical Q0600  . cinacalcet  90 mg Oral Q supper  . darbepoetin (ARANESP) injection - DIALYSIS  200 mcg Intravenous Q Mon-HD  . feeding supplement (NEPRO CARB STEADY)  237 mL Oral TID AC  . feeding supplement (PRO-STAT SUGAR FREE 64)  30 mL Oral BID  . mouth rinse  15 mL Mouth Rinse q12n4p  . midodrine  5 mg Oral TID WC  .  mirtazapine  7.5 mg Oral QHS  . multivitamin  1 tablet Oral QHS  . pantoprazole  40 mg Oral BID  . sucroferric oxyhydroxide  1,500 mg Oral TID WC  . warfarin  12.5 mg Oral ONCE-1800  . Warfarin - Pharmacist Dosing Inpatient   Does not apply q1800   Continuous Infusions: . heparin 1,750 Units/hr (02/08/18 0043)   PRN Meds:.acetaminophen, albuterol, benzonatate, dextromethorphan-guaiFENesin, hydrALAZINE, loperamide, nitroGLYCERIN, ondansetron (ZOFRAN) IV, oxyCODONE-acetaminophen  Assessment/Plan: Acute PE Left leg DVT S/P IVC filter ESRD Anemia of CKD Secondary hyperparathyroidism Hypertension S/P left renal hematoma Non ischemic cardiomyopathy H/O paroxysmal atrial flutter, CHA2DS2VASc score of 2  Continue medical treatment.   LOS: 13 days    Dixie Dials  MD  02/08/2018, 9:01 AM

## 2018-02-08 NOTE — Progress Notes (Signed)
[facility] paged the patient is requesting sleep medication. Arthor Captain LPN

## 2018-02-08 NOTE — Progress Notes (Signed)
OT Evaluation  - Late Entry  PTA, pt living at home with wife, who works during the day. Pt presents with decreased activity tolerance and will benefit from acute OT to educate pt on energy conservation and compensatory strategies for ADL and functional mobility to facilitate safe DC home.    02/07/18 1530  OT Visit Information  Last OT Received On 02/08/18  Assistance Needed +1  History of Present Illness 57 year old male with a history of ESRD on hemodialysis Monday Wednesday Friday, hypertension, CVA, GERD, gout, atrial fibrillation not on anticoagulation, systolic CHF with EF 59%, CAD, and status post left nephrectomy for spontaneous and life threatening hemorrhage in the left kidney in the setting of supratherapeutic INR who came to hospital with worsening shortness of breath; Found PE, IVC filter placed  Precautions  Precautions None  Home Living  Family/patient expects to be discharged to: Private residence  Living Arrangements Spouse/significant other  Available Help at Discharge Family;Available 24 hours/day  Type of [place] to enter  Entrance Stairs-Number of Steps 2  Entrance Stairs-Rails Right  Home Layout One level  Bathroom Shower/Tub Tub/shower unit;Curtain  Corporate treasurer Yes  How Accessible Accessible via walker  [place] - 2 wheels;Crutches  Prior Function  Level of Independence Independent  Comments drives himself to dialysis; enjoys mowing lawns; decline in activity level since last admission  Communication  Communication No difficulties  Pain Assessment  Pain Assessment No/denies pain  Cognition  Arousal/Alertness Awake/alert  Behavior During Therapy WFL for tasks assessed/performed  Overall Cognitive Status Within Functional Limits for tasks assessed  Upper Extremity Assessment  Upper Extremity Assessment Generalized weakness  Lower Extremity Assessment  Lower Extremity Assessment Defer  to PT evaluation  Cervical / Trunk Assessment  Cervical / Trunk Assessment Normal  ADL  Overall ADL's  Needs assistance/impaired  Functional mobility during ADLs Supervision/safety  General ADL Comments overall set up for ADL tasks; fatigues easily; currently stands for shower but states is exhausted afterwards; more fatigued on dialysis days; began education on energy conservation - handout provided; discussed possible use of rollator in commnity to reduce fatigue adn improve function  Bed Mobility  Overal bed mobility Modified Independent  Transfers  Overall transfer level Modified independent  Balance  Overall balance assessment No apparent balance deficits (not formally assessed) (prefers to hold IV pole for support)  OT - End of Session  Equipment Utilized During Treatment Gait belt  Activity Tolerance Patient tolerated treatment well  Patient left in bed;with call bell/phone within reach  Nurse Communication Mobility status  OT Assessment  OT Recommendation/Assessment Patient needs continued OT Services  OT Visit Diagnosis Unsteadiness on feet (R26.81);Muscle weakness (generalized) (M62.81)  OT Problem List Decreased strength;Decreased activity tolerance;Decreased knowledge of use of DME or AE;Cardiopulmonary status limiting activity;Obesity  OT Plan  OT Frequency (ACUTE ONLY) Min 2X/week  OT Treatment/Interventions (ACUTE ONLY) Self-care/ADL training;Therapeutic exercise;Energy conservation;DME and/or AE instruction;Therapeutic activities;Patient/family education  AM-PAC OT "6 Clicks" Daily Activity Outcome Measure  Help from another person eating meals? 4  Help from another person taking care of personal grooming? 4  Help from another person toileting, which includes using toliet, bedpan, or urinal? 4  Help from another person bathing (including washing, rinsing, drying)? 4  Help from another person to put on and taking off regular upper body clothing? 4  Help from another  person to put on and taking off regular lower body clothing? 4  6 Click Score 24  ADL G Code Conversion [place]  OT Recommendation  Follow Up Recommendations No OT follow up;Supervision - Intermittent  OT Equipment 3 in 1 bedside commode;Other (comment) (rollator)  Individuals Consulted  Consulted and Agree with Results and Recommendations Patient  Acute Rehab OT Goals  Patient Stated Goal to get stronger  OT Goal Formulation With patient  Time For Goal Achievement 02/22/18  Potential to Achieve Goals Good  OT Time Calculation  OT Start Time (ACUTE ONLY) 1507  OT Stop Time (ACUTE ONLY) 1530  OT Time Calculation (min) 23 min  OT General Charges  $OT Visit 1 Visit  Written Expression  Dominant Hand Right  Maurie Boettcher, OT/L   Acute OT Clinical Specialist [place] Pager [phone] Office [phone]

## 2018-02-08 NOTE — Progress Notes (Signed)
PROGRESS NOTE    [name]  PYP:[id number] DOB: 1960-09-30 DOA: 01/26/2018 PCP: Iona Beard, MD    Brief Narrative: 57 YO male with a history of ESRD on hemodialysis Monday Wednesday Friday, hypertension, CVA, GERD, gout, atrial fibrillation not on anticoagulation, systolic CHF with EF 24%, CAD, and status post left nephrectomy for spontaneous hemorrhage in the left kidney in the setting of supratherapeutic INR who came to hospital with worsening shortness of breath.  CTA showed possibility of pulmonary embolism, but pneumonia was also not excluded.  Assessment & PLAN  Principal Problem:   Acute respiratory failure with hypoxia (HCC) Active Problems:   Chronic combined systolic (congestive) and diastolic (congestive) heart failure (HCC)   Gout, chronic   ESRD (end stage renal disease) on dialysis (HCC)   Hemorrhage of left kidney   HCAP (healthcare-associated pneumonia)   CAD (coronary artery disease)   Atrial fibrillation, chronic (HCC)   Depression   GERD (gastroesophageal reflux disease)   Pulmonary embolism (HCC)   Anemia  Acute respiratory failure with hypoxia secondary to pulmonary embolism.  S/p IVC filter placement, start IV heparin, and coumadin, INR still sub therapeutic. Its 1.4 Given recent life threatening hemorrhage , ? Keep the patient till INR  At goal and make sure no evidence of spontaneous bleeding.    H/o chronic systolic and diastolic heart failure:  LVEF is 30 to 35%, volume management as per HD. Pt denies any chest pain or sob.    Osa:  Use CPAP at night.    Anemia of chronic disease:  Resume epogen and Fe as per renal.  Hemoglobin around 8 and stable.   GERD:  Resume PPI.    Health care associated pneumonia  Completed antibiotics. No sob or chest pain.    ESRD on HD  MWF , as per renal service.   GOUT:  No flare up.    Chronic atrial fibrillation: Rate controlled, on amiodarone and IV heparin and coumadin for anti  coagulation.   Mild hyponatremia from ESRD.    Pt reports loose stools from Midodrine.  Add imodium prn.   DVT prophylaxis: heparin and coumadin.  Code Status: full code.  Family Communication: none at bedside. Disposition Plan: pending clinical improvement and therapeutic INR.    Consultants:   Nephrology.   Procedures: None.   Antimicrobials: None.   Subjective: Patient resting in bed seen after dialysis denies any new complaints no nausea vomiting   Objective: Vitals:   02/08/18 1130 02/08/18 1200 02/08/18 1214 02/08/18 1325  BP: (!) 89/61 92/61 92/71  (!) 81/56  Pulse: 83 86 66 83  Resp:   18 18  Temp:   98.1 F (36.7 C) 98.1 F (36.7 C)  TempSrc:   Oral Oral  SpO2:   94% (!) 88%  Weight:   104.5 kg   Height:        Intake/Output Summary (Last 24 hours) at 02/08/2018 1418 Last data filed at 02/08/2018 1214 Gross per 24 hour  Intake 120 ml  Output 2246 ml  Net -2126 ml   Filed Weights   02/06/18 1233 02/08/18 0810 02/08/18 1214  Weight: 105 kg 106.5 kg 104.5 kg    Examination:  General exam: Appears calm and comfortable  Respiratory system: Clear to auscultation. Respiratory effort normal. Cardiovascular system: S1 & S2 heard, RRR. No JVD, murmurs, rubs, gallops or clicks. No pedal edema. Gastrointestinal system: Abdomen is nondistended, soft and nontender. No organomegaly or masses felt. Normal bowel sounds heard. Central nervous system: Alert  and oriented. No focal neurological deficits. Extremities: Symmetric 5 x 5 power. Skin: No rashes, lesions or ulcers Psychiatry: Judgement and insight appear normal. Mood & affect appropriate.     Data Reviewed: I have personally reviewed following labs and imaging studies  CBC: Recent Labs  Lab 02/04/18 0628 02/05/18 0637 02/06/18 0440 02/07/18 0513 02/08/18 0515  WBC 10.9* 10.1 9.3 10.0 9.3  HGB 8.4* 8.8* 8.2* 8.6* 9.0*  HCT 27.1* 28.8* 26.0* 27.7* 28.7*  MCV 98.2 98.3 97.0 98.2 97.3  PLT  255 269 275 249 366   Basic Metabolic Panel: Recent Labs  Lab 02/03/18 1225 02/04/18 0628 02/05/18 0637 02/06/18 0842 02/08/18 0810  NA 134* 134* 133* 133* 131*  K 3.2* 3.4* 4.1 4.3 3.9  CL 96* 95* 94* 94* 90*  CO2 25 27 23 24 26   GLUCOSE 114* 109* 76 77 75  BUN 57* 40* 55* 71* 50*  CREATININE 8.84* 7.80* 10.16* 12.64* 11.38*  CALCIUM 10.1 10.3 11.3* 11.7* 11.1*  PHOS 3.9  --   --  4.8* 4.6   GFR: Estimated Creatinine Clearance: 9.4 mL/min (A) (by C-G formula based on SCr of 11.38 mg/dL (H)). Liver Function Tests: Recent Labs  Lab 02/03/18 1225 02/04/18 0628 02/06/18 0842 02/08/18 0810  AST  --  33  --   --   ALT  --  24  --   --   ALKPHOS  --  82  --   --   BILITOT  --  1.0  --   --   PROT  --  8.2*  --   --   ALBUMIN 2.2* 2.4* 2.5* 2.6*   No results for input(s): LIPASE, AMYLASE in the last 168 hours. Recent Labs  Lab 02/04/18 0628  AMMONIA 40*   Coagulation Profile: Recent Labs  Lab 02/06/18 0440 02/07/18 0513 02/08/18 0515  INR 1.27 1.34 1.48   Cardiac Enzymes: No results for input(s): CKTOTAL, CKMB, CKMBINDEX, TROPONINI in the last 168 hours. BNP (last 3 results) No results for input(s): PROBNP in the last 8760 hours. HbA1C: No results for input(s): HGBA1C in the last 72 hours. CBG: Recent Labs  Lab 02/01/18 2044  GLUCAP 97   Lipid Profile: No results for input(s): CHOL, HDL, LDLCALC, TRIG, CHOLHDL, LDLDIRECT in the last 72 hours. Thyroid Function Tests: No results for input(s): TSH, T4TOTAL, FREET4, T3FREE, THYROIDAB in the last 72 hours. Anemia Panel: No results for input(s): VITAMINB12, FOLATE, FERRITIN, TIBC, IRON, RETICCTPCT in the last 72 hours. Sepsis Labs: No results for input(s): PROCALCITON, LATICACIDVEN in the last 168 hours.  No results found for this or any previous visit (from the past 240 hour(s)).       Radiology Studies: No results found.      Scheduled Meds: . allopurinol  150 mg Oral QODAY  . amiodarone  100  mg Oral Daily  . chlorhexidine  15 mL Mouth Rinse BID  . Chlorhexidine Gluconate Cloth  6 each Topical Q0600  . cinacalcet  90 mg Oral Q supper  . darbepoetin (ARANESP) injection - DIALYSIS  200 mcg Intravenous Q Mon-HD  . feeding supplement (NEPRO CARB STEADY)  237 mL Oral TID AC  . feeding supplement (PRO-STAT SUGAR FREE 64)  30 mL Oral BID  . mouth rinse  15 mL Mouth Rinse q12n4p  . midodrine      . midodrine  5 mg Oral TID WC  . mirtazapine  7.5 mg Oral QHS  . multivitamin  1 tablet Oral QHS  .  pantoprazole  40 mg Oral BID  . sucroferric oxyhydroxide  1,500 mg Oral TID WC  . warfarin  12.5 mg Oral ONCE-1800  . Warfarin - Pharmacist Dosing Inpatient   Does not apply q1800   Continuous Infusions: . heparin 1,750 Units/hr (02/08/18 0043)     LOS: 13 days     Georgette Shell, MD [facility]  If 7PM-7AM, please contact night-coverage [email or web address] Password [email or web address] 02/08/2018, 2:18 PM

## 2018-02-08 NOTE — Progress Notes (Signed)
[facility] Progress Note   Subjective:  Seen on HD. Says breathing was ok, but he slept terrible due to back pain. Also, reports still having low appetite. No CP today. On HD - 3L UF goal and tolerating so far, although BP is slightly low.  Objective Vitals:   02/08/18 0820 02/08/18 0830 02/08/18 0900 02/08/18 0930  BP: 109/75 109/74 98/71 (!) 98/51  Pulse: 76 75 83 82  Resp:      Temp:      TempSrc:      SpO2:      Weight:      Height:       Physical Exam General:Well appearing man, NAD. Heart:RRR; no murmur Lungs:CTA anteriorly Extremities:No LE edema Dialysis Access:AVF + thrill  Additional Objective Labs: Basic Metabolic Panel: Recent Labs  Lab 02/03/18 1225  02/05/18 0637 02/06/18 0842 02/08/18 0810  NA 134*   < > 133* 133* 131*  K 3.2*   < > 4.1 4.3 3.9  CL 96*   < > 94* 94* 90*  CO2 25   < > 23 24 26   GLUCOSE 114*   < > 76 77 75  BUN 57*   < > 55* 71* 50*  CREATININE 8.84*   < > 10.16* 12.64* 11.38*  CALCIUM 10.1   < > 11.3* 11.7* 11.1*  PHOS 3.9  --   --  4.8* 4.6   < > = values in this interval not displayed.   Liver Function Tests: Recent Labs  Lab 02/04/18 0628 02/06/18 0842 02/08/18 0810  AST 33  --   --   ALT 24  --   --   ALKPHOS 82  --   --   BILITOT 1.0  --   --   PROT 8.2*  --   --   ALBUMIN 2.4* 2.5* 2.6*   CBC: Recent Labs  Lab 02/04/18 0628 02/05/18 0637 02/06/18 0440 02/07/18 0513 02/08/18 0515  WBC 10.9* 10.1 9.3 10.0 9.3  HGB 8.4* 8.8* 8.2* 8.6* 9.0*  HCT 27.1* 28.8* 26.0* 27.7* 28.7*  MCV 98.2 98.3 97.0 98.2 97.3  PLT 255 269 275 249 281   Medications: . heparin 1,750 Units/hr (02/08/18 0043)   . allopurinol  150 mg Oral QODAY  . amiodarone  100 mg Oral Daily  . chlorhexidine  15 mL Mouth Rinse BID  . Chlorhexidine Gluconate Cloth  6 each Topical Q0600  . cinacalcet  90 mg Oral Q supper  . darbepoetin (ARANESP) injection - DIALYSIS  200 mcg Intravenous Q Mon-HD  . feeding supplement (NEPRO  CARB STEADY)  237 mL Oral TID AC  . feeding supplement (PRO-STAT SUGAR FREE 64)  30 mL Oral BID  . mouth rinse  15 mL Mouth Rinse q12n4p  . midodrine      . midodrine  5 mg Oral TID WC  . mirtazapine  7.5 mg Oral QHS  . multivitamin  1 tablet Oral QHS  . pantoprazole  40 mg Oral BID  . sucroferric oxyhydroxide  1,500 mg Oral TID WC  . warfarin  12.5 mg Oral ONCE-1800  . Warfarin - Pharmacist Dosing Inpatient   Does not apply q1800    Dialysis Orders: MWF at [facility] 4hr, 450/800, EDW 111.5kg, 2K/2.25Ca, #4/linear Na, AVF, Hep 4500 + 1500 - Hectoral 22mcg IV q HD - Parsabiv 12.5mg  IV q HD - Mircera 64mcg q 2 weeks  Assessment/Plan: 1.R Pulmonary emboli: S/p IVC filter d/t PE &L DVT. On heparin drip &warfarin. 2. ESRD:  Continue HD per MWF schedule, HD today with 3L UF goal. 3. Anemia of CKD: Hgb 9 - continue Aranesp 230mcg qwk (Mon). Follow trends. 4. Secondary hyperparathyroidism: Corr Ca high but a little lower today - possibly d/t immobility? Tremors and AMS resolved. [id number].Started on Sensipar 60mg  q HD - ^ dose to 90mg .VDRA d/c due to hypercalcemia.Binders changed from [place] to Velphorod/t diarrhea. 5. HTN/volume -BP variable. On midodrine. Significantly under edw, Eliane Decree, continue to titrate down volume,will need to establish new EDW prior to d/c. 6. Nutrition:Alb 2.6.Continue Nepro and Pro-stat supps. 7. L renal hemorrhage/hematoma - stable per CT 9/11 8. Hx A flutter - on amiodarone 9. NICM (EF 30-35%) 10.Severe debility from prolonged illness +/- mild depression: PT/OT on board.  Veneta Penton, PA-C 02/08/2018, 9:54 AM  [facility] Pager: [phone]

## 2018-02-09 LAB — CBC
HCT: 28.6 % — ABNORMAL LOW (ref 39.0–52.0)
Hemoglobin: 8.8 g/dL — ABNORMAL LOW (ref 13.0–17.0)
MCH: 30.6 pg (ref 26.0–34.0)
MCHC: 30.8 g/dL (ref 30.0–36.0)
MCV: 99.3 fL (ref 78.0–100.0)
Platelets: 237 10*3/uL (ref 150–400)
RBC: 2.88 MIL/uL — ABNORMAL LOW (ref 4.22–5.81)
RDW: 17.4 % — ABNORMAL HIGH (ref 11.5–15.5)
WBC: 8.3 10*3/uL (ref 4.0–10.5)

## 2018-02-09 LAB — HEPATITIS B SURFACE ANTIGEN: Hepatitis B Surface Ag: NEGATIVE

## 2018-02-09 LAB — PROTIME-INR
INR: 1.68
Prothrombin Time: 19.6 seconds — ABNORMAL HIGH (ref 11.4–15.2)

## 2018-02-09 LAB — HEPARIN LEVEL (UNFRACTIONATED): Heparin Unfractionated: 0.32 IU/mL (ref 0.30–0.70)

## 2018-02-09 MED ORDER — WARFARIN SODIUM 2.5 MG PO TABS
12.5000 mg | ORAL_TABLET | Freq: Once | ORAL | Status: AC
  Administered 2018-02-09: 12.5 mg via ORAL
  Filled 2018-02-09: qty 1

## 2018-02-09 NOTE — Progress Notes (Signed)
Initial Nutrition Assessment  DOCUMENTATION CODES:   Non-severe (moderate) malnutrition in context of chronic illness  INTERVENTION:   Nepro Shake po TID, each supplement provides 425 kcal and 19 grams protein  NUTRITION DIAGNOSIS:   Moderate Malnutrition related to chronic illness(ESRD on HD, CHF) as evidenced by moderate muscle depletion, energy intake < or equal to 75% for > or equal to 1 month.  GOAL:   Patient will meet greater than or equal to 90% of their needs  MONITOR:   PO intake, Supplement acceptance, Weight trends, Skin  REASON FOR ASSESSMENT:   LOS    ASSESSMENT:   57 yo admitted with acute respiratory failure due to PE s/p IVC filter. PMH includes ESRD on HD, CHF, HTN, renal cell carcinoma, GERD, CAD, diverticulitis   Recorded po 50-100% of meals. Pt ate 1 piece of [place] and 2 bites of rice at lunch today plus Nepro. Pt reports no appetite, altered taste. Pt drinking 2 Nepro per day at present. Reports he likes Nepro and drinks as outpatient but usually only 1-2 per week, reports too expensive. Pt indicates at home eating some soup and maybe 1/2 hamburger. Eat less on HD days, sometimes nothing at all.   Pt reports he takes his rena MVI in daily in the evening and sensipar with meal daily. Binder therapy changed to Vephoro from [place] due to diarrhea.   Pt acknowledges weight loss. EDW 111.5 kg, current wt 104.5 kg. 6% wt loss. Noted per MD, plan to adjust dry wt prior to discharge. Weight on admission 114.3 kg.   Net negative 4.3 L, last HD on 9/18 with net UF 2.3 L  Labs: sodium 131 Meds: remeron, Rena-Vit, velphoro, sensipar  NUTRITION - FOCUSED PHYSICAL EXAM:    Most Recent Value  Orbital Region  No depletion  Upper Arm Region  No depletion  Thoracic and Lumbar Region  No depletion  Buccal Region  No depletion  Temple Region  Moderate depletion  Clavicle Bone Region  Mild depletion  Clavicle and Acromion Bone Region  Mild depletion  Scapular  Bone Region  Mild depletion  Dorsal Hand  Mild depletion  Patellar Region  Mild depletion  Anterior Thigh Region  Moderate depletion  Posterior Calf Region  Mild depletion  Edema (RD Assessment)  None  Hair  Reviewed  Eyes  Reviewed  Mouth  Reviewed  Skin  Reviewed  Nails  Reviewed       Diet Order:   Diet Order            Diet renal with fluid restriction Fluid restriction: 1200 mL Fluid; Room service appropriate? Yes; Fluid consistency: Thin  Diet effective now              EDUCATION NEEDS:   Education needs have been addressed  Skin:  Skin Assessment: Skin Integrity Issues: Skin Integrity Issues:: Other (Comment) Other: MASD: buttocks  Last BM:  9/18  Height:   Ht Readings from Last 1 Encounters:  01/30/18 6\' 1"  (1.854 m)    Weight:   Wt Readings from Last 1 Encounters:  02/08/18 104.5 kg    Ideal Body Weight:  83.6 kg  BMI:  Body mass index is 30.4 kg/m.  Estimated Nutritional Needs:   Kcal:  2200-2400 kcals   Protein:  110-125 g  Fluid:  1000 mL plus UOP   [employer] MS, RD, LDN, CNSC [phone] Pager  [phone] Weekend/On-Call Pager

## 2018-02-09 NOTE — Progress Notes (Signed)
Ref: Iona Beard, MD   Subjective:  Small urethral bleed per patient. VS stable. INR-1.68.  Objective:  Vital Signs in the last 24 hours: Temp:  [97.9 F (36.6 C)-99.1 F (37.3 C)] 97.9 F (36.6 C) (09/19 0916) Pulse Rate:  [78-83] 83 (09/19 1327) Resp:  [18-20] 18 (09/19 0916) BP: (114-162)/(56-73) 157/56 (09/19 1327) SpO2:  [97 %-100 %] 100 % (09/19 1327) Weight:  [104.5 kg] 104.5 kg (09/18 2105)  Physical Exam: BP Readings from Last 1 Encounters:  02/09/18 (!) 157/56     Wt Readings from Last 1 Encounters:  02/08/18 104.5 kg    Weight change:  Body mass index is 30.4 kg/m. HEENT: [place]/AT, Eyes-Brown, PERL, EOMI, Conjunctiva-Pale, Sclera-Non-icteric Neck: No JVD, No bruit, Trachea midline. Lungs:  Clear, Bilateral. Cardiac:  Regular rhythm, normal S1 and S2, no S3. II/VI systolic murmur. Abdomen:  Soft, non-tender. BS present. Extremities:  No edema present. No cyanosis. No clubbing. LUE AVF. CNS: AxOx3, Cranial nerves grossly intact, moves all 4 extremities.  Skin: Warm and dry.   Intake/Output from previous day: 09/18 0701 - 09/19 0700 In: 660 [P.O.:660] Out: 2246     Lab Results: BMET    Component Value Date/Time   NA 131 (L) 02/08/2018 0810   NA 133 (L) 02/06/2018 0842   NA 133 (L) 02/05/2018 0637   K 3.9 02/08/2018 0810   K 4.3 02/06/2018 0842   K 4.1 02/05/2018 0637   CL 90 (L) 02/08/2018 0810   CL 94 (L) 02/06/2018 0842   CL 94 (L) 02/05/2018 0637   CO2 26 02/08/2018 0810   CO2 24 02/06/2018 0842   CO2 23 02/05/2018 0637   GLUCOSE 75 02/08/2018 0810   GLUCOSE 77 02/06/2018 0842   GLUCOSE 76 02/05/2018 0637   BUN 50 (H) 02/08/2018 0810   BUN 71 (H) 02/06/2018 0842   BUN 55 (H) 02/05/2018 0637   CREATININE 11.38 (H) 02/08/2018 0810   CREATININE 12.64 (H) 02/06/2018 0842   CREATININE 10.16 (H) 02/05/2018 0637   CALCIUM 11.1 (H) 02/08/2018 0810   CALCIUM 11.7 (H) 02/06/2018 0842   CALCIUM 11.3 (H) 02/05/2018 0637   GFRNONAA 4 (L) 02/08/2018  0810   GFRNONAA 4 (L) 02/06/2018 0842   GFRNONAA 5 (L) 02/05/2018 0637   GFRAA 5 (L) 02/08/2018 0810   GFRAA 5 (L) 02/06/2018 0842   GFRAA 6 (L) 02/05/2018 0637   CBC    Component Value Date/Time   WBC 8.3 02/09/2018 0631   RBC 2.88 (L) 02/09/2018 0631   HGB 8.8 (L) 02/09/2018 0631   HCT 28.6 (L) 02/09/2018 0631   PLT 237 02/09/2018 0631   MCV 99.3 02/09/2018 0631   MCH 30.6 02/09/2018 0631   MCHC 30.8 02/09/2018 0631   RDW 17.4 (H) 02/09/2018 0631   LYMPHSABS 0.8 01/21/2018 0936   MONOABS 1.2 (H) 01/21/2018 0936   EOSABS 0.2 01/21/2018 0936   BASOSABS 0.1 01/21/2018 0936   HEPATIC Function Panel Recent Labs    01/28/18 0256 01/31/18 0436 02/04/18 0628  PROT 7.4 8.2* 8.2*   HEMOGLOBIN A1C No components found for: HGA1C,  MPG CARDIAC ENZYMES Lab Results  Component Value Date   CKTOTAL 136 03/16/2017   CKMB 4.3 (H) 05/20/2011   TROPONINI 0.20 (HH) 01/13/2018   TROPONINI 0.21 (HH) 01/13/2018   TROPONINI 0.27 (HH) 01/13/2018   BNP No results for input(s): PROBNP in the last 8760 hours. TSH No results for input(s): TSH in the last 8760 hours. CHOLESTEROL No results for input(s): CHOL  in the last 8760 hours.  Scheduled Meds: . allopurinol  150 mg Oral QODAY  . amiodarone  100 mg Oral Daily  . chlorhexidine  15 mL Mouth Rinse BID  . Chlorhexidine Gluconate Cloth  6 each Topical Q0600  . cinacalcet  90 mg Oral Q supper  . darbepoetin (ARANESP) injection - DIALYSIS  200 mcg Intravenous Q Mon-HD  . feeding supplement (NEPRO CARB STEADY)  237 mL Oral TID AC  . feeding supplement (PRO-STAT SUGAR FREE 64)  30 mL Oral BID  . mouth rinse  15 mL Mouth Rinse q12n4p  . midodrine  5 mg Oral TID WC  . mirtazapine  7.5 mg Oral QHS  . multivitamin  1 tablet Oral QHS  . pantoprazole  40 mg Oral BID  . sucroferric oxyhydroxide  1,500 mg Oral TID WC  . warfarin  12.5 mg Oral ONCE-1800  . Warfarin - Pharmacist Dosing Inpatient   Does not apply q1800   Continuous  Infusions: . heparin 1,750 Units/hr (02/09/18 0618)   PRN Meds:.acetaminophen, albuterol, benzonatate, dextromethorphan-guaiFENesin, hydrALAZINE, loperamide, nitroGLYCERIN, ondansetron (ZOFRAN) IV, oxyCODONE-acetaminophen, zolpidem  Assessment/Plan: Acute PE Left leg DVT S/P IVC filter ESRD Anemia of CKD HTN Secondary hyperparathyroidism S/P left renal hematoma and hemorrhage Non-ischemic cardiomyopathy H/O Paroxysmal atrial flutter Hematuria  Continue medical treatment. Try to keep INR close to 2 to reduce chance of bleeding.   LOS: 14 days    Dixie Dials  MD  02/09/2018, 1:32 PM

## 2018-02-09 NOTE — Progress Notes (Addendum)
[facility] Progress Note   Subjective:  Seen in room, visiting with his sister. No new complaints; no CP/dyspnea.  Objective Vitals:   02/08/18 1325 02/08/18 2105 02/09/18 0441 02/09/18 0916  BP: (!) 81/56 129/65 114/64 (!) 162/73  Pulse: 83 83 78 82  Resp: 18 20 18 18   Temp: 98.1 F (36.7 C) 99.1 F (37.3 C) 98.3 F (36.8 C) 97.9 F (36.6 C)  TempSrc: Oral Oral Oral Oral  SpO2: (!) 88% 98% 97% 100%  Weight:  104.5 kg    Height:       Physical Exam General:Well appearing man, NAD. Heart:RRR; no murmur Lungs:CTA anteriorly Extremities:No LE edema Dialysis Access:AVF + thrill  Additional Objective Labs: Basic Metabolic Panel: Recent Labs  Lab 02/03/18 1225  02/05/18 0637 02/06/18 0842 02/08/18 0810  NA 134*   < > 133* 133* 131*  K 3.2*   < > 4.1 4.3 3.9  CL 96*   < > 94* 94* 90*  CO2 25   < > 23 24 26   GLUCOSE 114*   < > 76 77 75  BUN 57*   < > 55* 71* 50*  CREATININE 8.84*   < > 10.16* 12.64* 11.38*  CALCIUM 10.1   < > 11.3* 11.7* 11.1*  PHOS 3.9  --   --  4.8* 4.6   < > = values in this interval not displayed.   Liver Function Tests: Recent Labs  Lab 02/04/18 0628 02/06/18 0842 02/08/18 0810  AST 33  --   --   ALT 24  --   --   ALKPHOS 82  --   --   BILITOT 1.0  --   --   PROT 8.2*  --   --   ALBUMIN 2.4* 2.5* 2.6*   CBC: Recent Labs  Lab 02/05/18 0637 02/06/18 0440 02/07/18 0513 02/08/18 0515 02/09/18 0631  WBC 10.1 9.3 10.0 9.3 8.3  HGB 8.8* 8.2* 8.6* 9.0* 8.8*  HCT 28.8* 26.0* 27.7* 28.7* 28.6*  MCV 98.3 97.0 98.2 97.3 99.3  PLT 269 275 249 281 237   BMedications: . heparin 1,750 Units/hr (02/09/18 0618)   . allopurinol  150 mg Oral QODAY  . amiodarone  100 mg Oral Daily  . chlorhexidine  15 mL Mouth Rinse BID  . Chlorhexidine Gluconate Cloth  6 each Topical Q0600  . cinacalcet  90 mg Oral Q supper  . darbepoetin (ARANESP) injection - DIALYSIS  200 mcg Intravenous Q Mon-HD  . feeding supplement (NEPRO CARB  STEADY)  237 mL Oral TID AC  . feeding supplement (PRO-STAT SUGAR FREE 64)  30 mL Oral BID  . mouth rinse  15 mL Mouth Rinse q12n4p  . midodrine  5 mg Oral TID WC  . mirtazapine  7.5 mg Oral QHS  . multivitamin  1 tablet Oral QHS  . pantoprazole  40 mg Oral BID  . sucroferric oxyhydroxide  1,500 mg Oral TID WC  . warfarin  12.5 mg Oral ONCE-1800  . Warfarin - Pharmacist Dosing Inpatient   Does not apply q1800    Dialysis Orders: MWF at [facility] 4hr, 450/800, EDW 111.5kg, 2K/2.25Ca, #4/linear Na, AVF, Hep 4500 + 1500 - Hectoral 67mcg IV q HD - Parsabiv 12.5mg  IV q HD - Mircera 22mcg q 2 weeks  Assessment/Plan: 1.R Pulmonary emboli: S/p IVC filter d/t PE &L DVT. On heparin drip &warfarin; INR 1.68. 2. ESRD: Continue HD per MWF schedule, next 9/20. 3. Anemia of CKD: Hgb 8.8 - continue Aranesp 227mcg  qwk (Mon). Follow trends. 4. Secondary hyperparathyroidism: [place] but a little lower- possibly d/t immobility? Tremors and AMS resolved. [id number].Started on Sensipar 60mg  q HD- ^ dose to 90mg .VDRA d/c due to hypercalcemia.Binders changed from [place] to Velphorod/t diarrhea. 5. HTN/volume -BP variable. On midodrine. Significantly under edw, Eliane Decree, continue to titrate down volume,will need to establish new EDW prior to d/c. 6. Nutrition:Alb 2.6.Continue Nepro and Pro-stat supps. 7. L renal hemorrhage/hematoma - stable per CT 9/11 8. Hx A flutter - on amiodarone 9. NICM (EF 30-35%) 10.Severe debility from prolonged illness +/- mild depression: PT/OT on board.   Veneta Penton, PA-C 02/09/2018, 1:06 PM  [facility] Pager: [phone]  Pt seen, examined and agree w A/P as above.  Kelly Splinter MD [employer] pager [phone]   02/09/2018, 1:20 PM

## 2018-02-09 NOTE — Progress Notes (Signed)
PROGRESS NOTE    [name]  LAG:[id number] DOB: 12-Jan-1961 DOA: 01/26/2018 PCP: Iona Beard, MD    Brief Narrative:  year-old male with a history of ESRD on hemodialysis Monday Wednesday Friday, hypertension, CVA, GERD, gout, atrial fibrillation not on anticoagulation, systolic CHF with EF 12%, CAD, and status post left nephrectomy for spontaneous hemorrhage in the left kidney in the setting of supratherapeutic INR who came to hospital with worsening shortness of breath.  CTA showed possibility of pulmonary embolism.     Principal Problem:   Acute respiratory failure with hypoxia (HCC) Active Problems:   Chronic combined systolic (congestive) and diastolic (congestive) heart failure (HCC)   Gout, chronic   ESRD (end stage renal disease) on dialysis (HCC)   Hemorrhage of left kidney   HCAP (healthcare-associated pneumonia)   CAD (coronary artery disease)   Atrial fibrillation, chronic (HCC)   Depression   GERD (gastroesophageal reflux disease)   Pulmonary embolism (HCC)   Anemia   Acute respiratory failure with hypoxia secondary to pulmonary embolism.  S/p IVC filter placement, started IV heparin, and coumadin, INR still sub therapeutic. Its 1.68 Given recent life threatening hemorrhage ,  Keep the patient till INR  At goal and make sure no evidence of spontaneous bleeding.  He denies any chest pain or sob.    H/o chronic systolic and diastolic heart failure:  LVEF is 30 to 35%, volume management as per HD. Pt denies any chest pain or sob.    Osa:  Use CPAP at night.    Anemia of chronic disease:  Resume epogen and Fe as per renal.  Hemoglobin around 8 and stable.    GERD:  Resume PPI.    Health care associated pneumonia  Completed antibiotics. No sob or chest pain.    ESRD on HD  MWF , as per renal service.   GOUT:  No flare up.    Chronic atrial fibrillation: Rate controlled, on amiodarone and IV heparin and coumadin for anti coagulation.   Mild  hyponatremia from ESRD. Asymptomatic.   Hypercalcemia:  Management as per renal. He is asymptomatic.   Pt reports loose stools from Midodrine.  Add imodium prn. He reports the imodium helped.    DVT prophylaxis: heparin and coumadin.  Code Status: full code.  Family Communication: none at bedside. Disposition Plan: pending  therapeutic INR.    Consultants:   Nephrology.   Procedures: None.   Antimicrobials: None.    Subjective: No loose stools. No chest pain or sob.   Objective: Vitals:   02/08/18 1325 02/08/18 2105 02/09/18 0441 02/09/18 0916  BP: (!) 81/56 129/65 114/64 (!) 162/73  Pulse: 83 83 78 82  Resp: 18 20 18 18   Temp: 98.1 F (36.7 C) 99.1 F (37.3 C) 98.3 F (36.8 C) 97.9 F (36.6 C)  TempSrc: Oral Oral Oral Oral  SpO2: (!) 88% 98% 97% 100%  Weight:  104.5 kg    Height:        Intake/Output Summary (Last 24 hours) at 02/09/2018 1234 Last data filed at 02/09/2018 0900 Gross per 24 hour  Intake 960 ml  Output 2 ml  Net 958 ml   Filed Weights   02/08/18 0810 02/08/18 1214 02/08/18 2105  Weight: 106.5 kg 104.5 kg 104.5 kg    Examination:  General exam:calm and comfortable, on RA.  Respiratory system:air entry fair , no wheezing or rhonchi.  Cardiovascular system: S1 & S2 heard, RRR. No JVD, murmurs,  Gastrointestinal system: Abdomen is soft non  tender non distended bowel sounds good.  Central nervous system: Alert and oriented. Non focal.  Extremities: no pedal edema.  Skin: MASD over the sacrum.  Psychiatry:  Mood & affect appropriate.     Data Reviewed: I have personally reviewed following labs and imaging studies  CBC: Recent Labs  Lab 02/05/18 0637 02/06/18 0440 02/07/18 0513 02/08/18 0515 02/09/18 0631  WBC 10.1 9.3 10.0 9.3 8.3  HGB 8.8* 8.2* 8.6* 9.0* 8.8*  HCT 28.8* 26.0* 27.7* 28.7* 28.6*  MCV 98.3 97.0 98.2 97.3 99.3  PLT 269 275 249 281 546   Basic Metabolic Panel: Recent Labs  Lab 02/03/18 1225 02/04/18 0628  02/05/18 0637 02/06/18 0842 02/08/18 0810  NA 134* 134* 133* 133* 131*  K 3.2* 3.4* 4.1 4.3 3.9  CL 96* 95* 94* 94* 90*  CO2 25 27 23 24 26   GLUCOSE 114* 109* 76 77 75  BUN 57* 40* 55* 71* 50*  CREATININE 8.84* 7.80* 10.16* 12.64* 11.38*  CALCIUM 10.1 10.3 11.3* 11.7* 11.1*  PHOS 3.9  --   --  4.8* 4.6   GFR: Estimated Creatinine Clearance: 9.4 mL/min (A) (by C-G formula based on SCr of 11.38 mg/dL (H)). Liver Function Tests: Recent Labs  Lab 02/03/18 1225 02/04/18 0628 02/06/18 0842 02/08/18 0810  AST  --  33  --   --   ALT  --  24  --   --   ALKPHOS  --  82  --   --   BILITOT  --  1.0  --   --   PROT  --  8.2*  --   --   ALBUMIN 2.2* 2.4* 2.5* 2.6*   No results for input(s): LIPASE, AMYLASE in the last 168 hours. Recent Labs  Lab 02/04/18 0628  AMMONIA 40*   Coagulation Profile: Recent Labs  Lab 02/06/18 0440 02/07/18 0513 02/08/18 0515 02/09/18 0631  INR 1.27 1.34 1.48 1.68   Cardiac Enzymes: No results for input(s): CKTOTAL, CKMB, CKMBINDEX, TROPONINI in the last 168 hours. BNP (last 3 results) No results for input(s): PROBNP in the last 8760 hours. HbA1C: No results for input(s): HGBA1C in the last 72 hours. CBG: No results for input(s): GLUCAP in the last 168 hours. Lipid Profile: No results for input(s): CHOL, HDL, LDLCALC, TRIG, CHOLHDL, LDLDIRECT in the last 72 hours. Thyroid Function Tests: No results for input(s): TSH, T4TOTAL, FREET4, T3FREE, THYROIDAB in the last 72 hours. Anemia Panel: No results for input(s): VITAMINB12, FOLATE, FERRITIN, TIBC, IRON, RETICCTPCT in the last 72 hours. Sepsis Labs: No results for input(s): PROCALCITON, LATICACIDVEN in the last 168 hours.  No results found for this or any previous visit (from the past 240 hour(s)).       Radiology Studies: No results found.      Scheduled Meds: . allopurinol  150 mg Oral QODAY  . amiodarone  100 mg Oral Daily  . chlorhexidine  15 mL Mouth Rinse BID  .  Chlorhexidine Gluconate Cloth  6 each Topical Q0600  . cinacalcet  90 mg Oral Q supper  . darbepoetin (ARANESP) injection - DIALYSIS  200 mcg Intravenous Q Mon-HD  . feeding supplement (NEPRO CARB STEADY)  237 mL Oral TID AC  . feeding supplement (PRO-STAT SUGAR FREE 64)  30 mL Oral BID  . mouth rinse  15 mL Mouth Rinse q12n4p  . midodrine  5 mg Oral TID WC  . mirtazapine  7.5 mg Oral QHS  . multivitamin  1 tablet Oral QHS  .  pantoprazole  40 mg Oral BID  . sucroferric oxyhydroxide  1,500 mg Oral TID WC  . warfarin  12.5 mg Oral ONCE-1800  . Warfarin - Pharmacist Dosing Inpatient   Does not apply q1800   Continuous Infusions: . heparin 1,750 Units/hr (02/09/18 0618)     LOS: 14 days    Time spent: 25 minutes.     Hosie Poisson, MD [facility] Pager [phone]   If 7PM-7AM, please contact night-coverage [email or web address] Password [facility] 02/09/2018, 12:34 PM

## 2018-02-09 NOTE — Plan of Care (Signed)
  Problem: Skin Integrity: Goal: Risk for impaired skin integrity will decrease Outcome: Progressing   

## 2018-02-09 NOTE — Progress Notes (Signed)
ANTICOAGULATION CONSULT NOTE - Follow Up Consult  Pharmacy Consult for Heparin and Warfarin Indication: pulmonary embolus  Allergies  Allergen Reactions  . Ace Inhibitors Itching and Cough    Patient Measurements: Height: 6\' 1"  (185.4 cm) Weight: 230 lb 6.1 oz (104.5 kg) IBW/kg (Calculated) : 79.9 Heparin Dosing Weight: 102 kg  Vital Signs: Temp: 98.3 F (36.8 C) (09/19 0441) Temp Source: Oral (09/19 0441) BP: 114/64 (09/19 0441) Pulse Rate: 78 (09/19 0441)  Labs: Recent Labs    02/06/18 0842  02/07/18 0513 02/08/18 0515 02/08/18 0810 02/09/18 0630 02/09/18 0631  HGB  --    < > 8.6* 9.0*  --   --  8.8*  HCT  --   --  27.7* 28.7*  --   --  28.6*  PLT  --   --  249 281  --   --  237  LABPROT  --   --  16.5* 17.8*  --   --  19.6*  INR  --   --  1.34 1.48  --   --  1.68  HEPARINUNFRC  --   --  0.44 0.39  --  0.32  --   CREATININE 12.64*  --   --   --  11.38*  --   --    < > = values in this interval not displayed.   Assessment:  55 yom who has known hx of ESRD (MWF), atrial fibrillation and is s/p L nephrectomy for spontaneous hemorrhage in L kidney (01/13/18) in setting of supratherapeutic INR on warfarin. Off anticoagulation since that time.  CTA 01/26/18 found probable R upper lobe PE without R heart strain. IVC filter placed 9/6.  Prior pharmacist discussed with MD and given high bleed risk will be conservative in heparin dosing with reduced goal and no bolus despite known PE. Warfarin resumed 9/15 PM.  Heparin level this morning remains therapeutic (HL 0.32, goal of 0.3-0.5). INR remains SUBtherapeutic but is trending up steadily. (INR 1.68<<1.48<<1.34, goal of 2-3). Hgb 8.8 << 8.2, plts wnl. No bleeding noted at this time.    Due to the risks of overshooting INR goal, will not increase dose of warfarin. INR is trending up toward goal on current dose. Heparin drip is stable with HL 0.3-0.5. No changes today.   Goal of Therapy:  Heparin level 0.3-0.5 units/ml (lower  end of therapeutic range) Monitor platelets by anticoagulation protocol: Yes   Plan:  - Warfarin 12.5 mg x 1 dose at 1800 today - Continue Heparin drip at 1750 units/hr - Daily heparin level, INR, and CBC. - Monitor for any s/sx bleeding.   Lamyra Malcolm A. Levada Dy, PharmD, [place] Pager: [phone] Please utilize Amion for appropriate phone number to reach the unit pharmacist ([place])   02/09/2018 7:55 AM

## 2018-02-09 NOTE — Progress Notes (Signed)
Physical Therapy Treatment Patient Details Name: [name] MRN: [id number] DOB: 01-05-1961 Today's Date: 02/09/2018    History of Present Illness 57 year old male with a history of ESRD on hemodialysis Monday Wednesday Friday, hypertension, CVA, GERD, gout, atrial fibrillation not on anticoagulation, systolic CHF with EF 03%, CAD, and status post left nephrectomy for spontaneous and life threatening hemorrhage in the left kidney in the setting of supratherapeutic INR who came to hospital with worsening shortness of breath; Found PE, IVC filter placed    PT Comments    Session focused on continued progression of activity tolerance and functional mobility. Patient progressing towards physical therapy goals as evidenced by increased ambulation distance to 140 feet using walker and supervision. Recommending walker for long distance ambulation in order to increase stability, gait speed, and after dialysis days for fatigue. Will continue to follow and progress mobility.   Follow Up Recommendations  No PT follow up     Equipment Recommendations  Rolling walker with 5" wheels    Recommendations for Other Services       Precautions / Restrictions Precautions Precaution Comments: slight fall risk Restrictions Weight Bearing Restrictions: No    Mobility  Bed Mobility Overal bed mobility: Modified Independent                Transfers Overall transfer level: Modified independent Equipment used: None Transfers: Sit to/from Stand Sit to Stand: Modified independent (Device/Increase time)         General transfer comment: cues for hand placement  Ambulation/Gait Ambulation/Gait assistance: Supervision Gait Distance (Feet): 140 Feet Assistive device: Rolling walker (2 wheeled);IV Pole       General Gait Details: patient with increased steadiness and gait speed using walker   Stairs             Wheelchair Mobility    Modified Rankin (Stroke Patients Only)        Balance Overall balance assessment: No apparent balance deficits (not formally assessed)                                          Cognition Arousal/Alertness: Awake/alert Behavior During Therapy: WFL for tasks assessed/performed Overall Cognitive Status: Within Functional Limits for tasks assessed                                        Exercises      General Comments        Pertinent Vitals/Pain Pain Assessment: Faces Faces Pain Scale: Hurts a little bit Pain Location: right flank Pain Descriptors / Indicators: Discomfort;Burning Pain Intervention(s): Monitored during session;Limited activity within patient's tolerance    Home Living                      Prior Function            PT Goals (current goals can now be found in the care plan section) Acute Rehab PT Goals Patient Stated Goal: to get stronger PT Goal Formulation: With patient Time For Goal Achievement: 02/21/18 Potential to Achieve Goals: Good Progress towards PT goals: Progressing toward goals    Frequency    Min 3X/week      PT Plan Current plan remains appropriate    Co-evaluation  AM-PAC PT "6 Clicks" Daily Activity  Outcome Measure  Difficulty turning over in bed (including adjusting bedclothes, sheets and blankets)?: None Difficulty moving from lying on back to sitting on the side of the bed? : None Difficulty sitting down on and standing up from a chair with arms (e.g., wheelchair, bedside commode, etc,.)?: None Help needed moving to and from a bed to chair (including a wheelchair)?: A Little Help needed walking in hospital room?: A Little Help needed climbing 3-5 steps with a railing? : A Little 6 Click Score: 21    End of Session Equipment Utilized During Treatment: Gait belt Activity Tolerance: Patient tolerated treatment well Patient left: with call bell/phone within reach;in bed Nurse Communication: Mobility  status PT Visit Diagnosis: Unsteadiness on feet (R26.81);Muscle weakness (generalized) (M62.81)     Time: 9622-2979 PT Time Calculation (min) (ACUTE ONLY): 12 min  Charges:  $Therapeutic Activity: 8-22 mins                     Ellamae Sia, PT, DPT Acute Rehabilitation Services Pager [phone] Office [phone]    Willy Eddy 02/09/2018, 2:48 PM

## 2018-02-10 DIAGNOSIS — E440 Moderate protein-calorie malnutrition: Secondary | ICD-10-CM

## 2018-02-10 LAB — PROTIME-INR
INR: 1.8
Prothrombin Time: 20.7 seconds — ABNORMAL HIGH (ref 11.4–15.2)

## 2018-02-10 LAB — RENAL FUNCTION PANEL
Albumin: 2.6 g/dL — ABNORMAL LOW (ref 3.5–5.0)
Anion gap: 14 (ref 5–15)
BUN: 35 mg/dL — ABNORMAL HIGH (ref 6–20)
CO2: 26 mmol/L (ref 22–32)
Calcium: 10.6 mg/dL — ABNORMAL HIGH (ref 8.9–10.3)
Chloride: 92 mmol/L — ABNORMAL LOW (ref 98–111)
Creatinine, Ser: 9.62 mg/dL — ABNORMAL HIGH (ref 0.61–1.24)
GFR calc Af Amer: 6 mL/min — ABNORMAL LOW (ref >60)
GFR calc non Af Amer: 5 mL/min — ABNORMAL LOW (ref >60)
Glucose, Bld: 73 mg/dL (ref 70–99)
Phosphorus: 4.4 mg/dL (ref 2.5–4.6)
Potassium: 3.8 mmol/L (ref 3.5–5.1)
Sodium: 132 mmol/L — ABNORMAL LOW (ref 135–145)

## 2018-02-10 LAB — CBC
HCT: 29 % — ABNORMAL LOW (ref 39.0–52.0)
Hemoglobin: 9 g/dL — ABNORMAL LOW (ref 13.0–17.0)
MCH: 31 pg (ref 26.0–34.0)
MCHC: 31 g/dL (ref 30.0–36.0)
MCV: 100 fL (ref 78.0–100.0)
Platelets: 269 10*3/uL (ref 150–400)
RBC: 2.9 MIL/uL — ABNORMAL LOW (ref 4.22–5.81)
RDW: 17.8 % — ABNORMAL HIGH (ref 11.5–15.5)
WBC: 8.3 10*3/uL (ref 4.0–10.5)

## 2018-02-10 LAB — HEPARIN LEVEL (UNFRACTIONATED): Heparin Unfractionated: 0.4 IU/mL (ref 0.30–0.70)

## 2018-02-10 MED ORDER — NEPRO/CARBSTEADY PO LIQD
237.0000 mL | Freq: Three times a day (TID) | ORAL | Status: DC
  Administered 2018-02-10 – 2018-02-11 (×3): 237 mL via ORAL
  Filled 2018-02-10 (×5): qty 237

## 2018-02-10 MED ORDER — WARFARIN SODIUM 7.5 MG PO TABS
15.0000 mg | ORAL_TABLET | Freq: Once | ORAL | Status: AC
  Administered 2018-02-10: 15 mg via ORAL
  Filled 2018-02-10 (×2): qty 2

## 2018-02-10 NOTE — Progress Notes (Signed)
Ref: Iona Beard, MD   Subjective:  Small urethral bleed per patient. INR-1.80  Objective:  Vital Signs in the last 24 hours: Temp:  [97.6 F (36.4 C)-98.6 F (37 C)] 97.6 F (36.4 C) (09/20 1251) Pulse Rate:  [66-81] 75 (09/20 1251) Resp:  [16-20] 16 (09/20 1251) BP: (108-167)/(60-85) 141/63 (09/20 1251) SpO2:  [97 %-100 %] 98 % (09/20 1251) Weight:  [104.6 kg-107.1 kg] 104.6 kg (09/20 1251)  Physical Exam: BP Readings from Last 1 Encounters:  02/10/18 (!) 141/63     Wt Readings from Last 1 Encounters:  02/10/18 104.6 kg    Weight change:  Body mass index is 30.42 kg/m. HEENT: [place]/AT, Eyes-Brown, PERL, EOMI, Conjunctiva-Pale, Sclera-Non-icteric Neck: No JVD, No bruit, Trachea midline. Lungs:  Clear, Bilateral. Cardiac:  Regular rhythm, normal S1 and S2, no S3. II/VI systolic murmur. Abdomen:  Soft, non-tender. BS present. Extremities:  No edema present. No cyanosis. No clubbing. LUE AVF. CNS: AxOx3, Cranial nerves grossly intact, moves all 4 extremities.  Skin: Warm and dry.   Intake/Output from previous day: 09/19 0701 - 09/20 0700 In: 1280 [P.O.:1140; I.V.:140] Out: 2 [Urine:1; Stool:1]    Lab Results: BMET    Component Value Date/Time   NA 132 (L) 02/10/2018 0952   NA 131 (L) 02/08/2018 0810   NA 133 (L) 02/06/2018 0842   K 3.8 02/10/2018 0952   K 3.9 02/08/2018 0810   K 4.3 02/06/2018 0842   CL 92 (L) 02/10/2018 0952   CL 90 (L) 02/08/2018 0810   CL 94 (L) 02/06/2018 0842   CO2 26 02/10/2018 0952   CO2 26 02/08/2018 0810   CO2 24 02/06/2018 0842   GLUCOSE 73 02/10/2018 0952   GLUCOSE 75 02/08/2018 0810   GLUCOSE 77 02/06/2018 0842   BUN 35 (H) 02/10/2018 0952   BUN 50 (H) 02/08/2018 0810   BUN 71 (H) 02/06/2018 0842   CREATININE 9.62 (H) 02/10/2018 0952   CREATININE 11.38 (H) 02/08/2018 0810   CREATININE 12.64 (H) 02/06/2018 0842   CALCIUM 10.6 (H) 02/10/2018 0952   CALCIUM 11.1 (H) 02/08/2018 0810   CALCIUM 11.7 (H) 02/06/2018 0842    GFRNONAA 5 (L) 02/10/2018 0952   GFRNONAA 4 (L) 02/08/2018 0810   GFRNONAA 4 (L) 02/06/2018 0842   GFRAA 6 (L) 02/10/2018 0952   GFRAA 5 (L) 02/08/2018 0810   GFRAA 5 (L) 02/06/2018 0842   CBC    Component Value Date/Time   WBC 8.3 02/10/2018 0832   RBC 2.90 (L) 02/10/2018 0832   HGB 9.0 (L) 02/10/2018 0832   HCT 29.0 (L) 02/10/2018 0832   PLT 269 02/10/2018 0832   MCV 100.0 02/10/2018 0832   MCH 31.0 02/10/2018 0832   MCHC 31.0 02/10/2018 0832   RDW 17.8 (H) 02/10/2018 0832   LYMPHSABS 0.8 01/21/2018 0936   MONOABS 1.2 (H) 01/21/2018 0936   EOSABS 0.2 01/21/2018 0936   BASOSABS 0.1 01/21/2018 0936   HEPATIC Function Panel Recent Labs    01/28/18 0256 01/31/18 0436 02/04/18 0628  PROT 7.4 8.2* 8.2*   HEMOGLOBIN A1C No components found for: HGA1C,  MPG CARDIAC ENZYMES Lab Results  Component Value Date   CKTOTAL 136 03/16/2017   CKMB 4.3 (H) 05/20/2011   TROPONINI 0.20 (HH) 01/13/2018   TROPONINI 0.21 (HH) 01/13/2018   TROPONINI 0.27 (HH) 01/13/2018   BNP No results for input(s): PROBNP in the last 8760 hours. TSH No results for input(s): TSH in the last 8760 hours. CHOLESTEROL No results for input(s):  CHOL in the last 8760 hours.  Scheduled Meds: . allopurinol  150 mg Oral QODAY  . amiodarone  100 mg Oral Daily  . chlorhexidine  15 mL Mouth Rinse BID  . Chlorhexidine Gluconate Cloth  6 each Topical Q0600  . cinacalcet  90 mg Oral Q supper  . darbepoetin (ARANESP) injection - DIALYSIS  200 mcg Intravenous Q Mon-HD  . feeding supplement (NEPRO CARB STEADY)  237 mL Oral TID BM  . feeding supplement (PRO-STAT SUGAR FREE 64)  30 mL Oral BID  . mouth rinse  15 mL Mouth Rinse q12n4p  . midodrine  5 mg Oral TID WC  . mirtazapine  7.5 mg Oral QHS  . multivitamin  1 tablet Oral QHS  . pantoprazole  40 mg Oral BID  . sucroferric oxyhydroxide  1,500 mg Oral TID WC  . warfarin  15 mg Oral ONCE-1800  . Warfarin - Pharmacist Dosing Inpatient   Does not apply q1800    Continuous Infusions: . heparin 1,750 Units/hr (02/10/18 1205)   PRN Meds:.acetaminophen, albuterol, benzonatate, dextromethorphan-guaiFENesin, hydrALAZINE, loperamide, nitroGLYCERIN, ondansetron (ZOFRAN) IV, oxyCODONE-acetaminophen, zolpidem  Assessment/Plan: Acute PE Left leg DVT S/P IVC filter ESRD Anemia of CKD HTN Secondary hyperparathyroidism S/P left renal hematoma and hemorrhage Hematuria H/O Paroxysmal atrial flutter Non-ischemic cardiomyopathy  Patient has anxiety over anticoagulation and hematuria.   LOS: 15 days    Dixie Dials  MD  02/10/2018, 4:36 PM

## 2018-02-10 NOTE — Progress Notes (Signed)
   02/10/18 0900  OT Visit Information  Last OT Received On 02/10/18  Assistance Needed +1  Reason Eval/Treat Not Completed Patient at procedure or test/ unavailable  History of Present Illness 57 year old male with a history of ESRD on hemodialysis Monday Wednesday Friday, hypertension, CVA, GERD, gout, atrial fibrillation not on anticoagulation, systolic CHF with EF 62%, CAD, and status post left nephrectomy for spontaneous and life threatening hemorrhage in the left kidney in the setting of supratherapeutic INR who came to hospital with worsening shortness of breath; Found PE, IVC filter placed    Pt is at HD.  Tyrone Schimke, OT Acute Rehabilitation Services Pager: [phone] Office: [phone]

## 2018-02-10 NOTE — Progress Notes (Signed)
PROGRESS NOTE    [name]  ZDG:[id number] DOB: [birth year] DOA: 01/26/2018 PCP: Iona Beard, MD    Brief Narrative:  year-old male with a history of ESRD on hemodialysis Monday Wednesday Friday, hypertension, CVA, GERD, gout, atrial fibrillation not on anticoagulation, systolic CHF with EF 59%, CAD, and status post left nephrectomy for spontaneous hemorrhage in the left kidney in the setting of supratherapeutic INR who came to hospital with worsening shortness of breath.  CTA showed possibility of pulmonary embolism.     Principal Problem:   Acute respiratory failure with hypoxia (HCC) Active Problems:   Chronic combined systolic (congestive) and diastolic (congestive) heart failure (HCC)   Gout, chronic   ESRD (end stage renal disease) on dialysis (HCC)   Hemorrhage of left kidney   HCAP (healthcare-associated pneumonia)   CAD (coronary artery disease)   Atrial fibrillation, chronic (HCC)   Depression   GERD (gastroesophageal reflux disease)   Pulmonary embolism (HCC)   Anemia   Malnutrition of moderate degree   Acute respiratory failure with hypoxia secondary to pulmonary embolism.  S/p IVC filter placement, started IV heparin, and coumadin, INR still sub therapeutic. Its 1.8 today.  Given recent life threatening hemorrhage ,  Keep the patient till INR  At goal and make sure no evidence of spontaneous bleeding.  He denies any chest pain or sob.   Today patient reports occasional bleeding in the urine, with some clots. His H&H  Appears stable.    H/o chronic systolic and diastolic heart failure:  LVEF is 30 to 35%, volume management as per HD. Pt denies any chest pain or sob.    Osa:  Use CPAP at night.    Anemia of chronic disease:  Resume epogen and Fe as per renal.  Hemoglobin around 8 and stable.    GERD:  Resume PPI.    Health care associated pneumonia  Completed antibiotics. No sob or chest pain.    ESRD on HD  MWF , as per renal service.     GOUT:  No flare up.    Chronic atrial fibrillation: Rate controlled, on amiodarone and IV heparin and coumadin for anti coagulation.   Mild hyponatremia from ESRD. Asymptomatic.   Hypercalcemia:  Management as per renal. He is asymptomatic.   Pt reports loose stools from Midodrine.  Add imodium prn. He reports the imodium helped.    DVT prophylaxis: heparin and coumadin.  Code Status: full code.  Family Communication: none at bedside. Disposition Plan: pending  therapeutic INR.    Consultants:   Nephrology.   Procedures: None.   Antimicrobials: None.    Subjective: Diarrhea improved. He reports some hematuria.  No chest pain or sob.  Some pain in the right lower quadrant from the lovenox injection.   Objective: Vitals:   02/10/18 1100 02/10/18 1130 02/10/18 1145 02/10/18 1200  BP: 134/67 (!) 167/77 124/66 (!) 143/60  Pulse: 70 77 66 74  Resp:      Temp:      TempSrc:      SpO2:      Weight:      Height:        Intake/Output Summary (Last 24 hours) at 02/10/2018 1228 Last data filed at 02/10/2018 0600 Gross per 24 hour  Intake 980 ml  Output 0 ml  Net 980 ml   Filed Weights   02/08/18 1214 02/08/18 2105 02/10/18 0845  Weight: 104.5 kg 104.5 kg 107.1 kg    Examination:  General exam:calm and comfortable, on  RA. Not in any distress.  Respiratory system: air entry fair, no wheezing or rhonchi.  Cardiovascular system: S1 & S2 heard, RRR. No JVD, murmurs,  Gastrointestinal system: Abdomen is soft NT nd bs+ Central nervous system: Alert and oriented. Non focal.  Extremities: no pedal edema.  Skin: MASD over the sacrum.  Psychiatry:  Mood & affect appropriate.     Data Reviewed: I have personally reviewed following labs and imaging studies  CBC: Recent Labs  Lab 02/06/18 0440 02/07/18 0513 02/08/18 0515 02/09/18 0631 02/10/18 0832  WBC 9.3 10.0 9.3 8.3 8.3  HGB 8.2* 8.6* 9.0* 8.8* 9.0*  HCT 26.0* 27.7* 28.7* 28.6* 29.0*  MCV 97.0 98.2  97.3 99.3 100.0  PLT 275 249 281 237 341   Basic Metabolic Panel: Recent Labs  Lab 02/04/18 0628 02/05/18 0637 02/06/18 0842 02/08/18 0810 02/10/18 0952  NA 134* 133* 133* 131* 132*  K 3.4* 4.1 4.3 3.9 3.8  CL 95* 94* 94* 90* 92*  CO2 27 23 24 26 26   GLUCOSE 109* 76 77 75 73  BUN 40* 55* 71* 50* 35*  CREATININE 7.80* 10.16* 12.64* 11.38* 9.62*  CALCIUM 10.3 11.3* 11.7* 11.1* 10.6*  PHOS  --   --  4.8* 4.6 4.4   GFR: Estimated Creatinine Clearance: 11.3 mL/min (A) (by C-G formula based on SCr of 9.62 mg/dL (H)). Liver Function Tests: Recent Labs  Lab 02/04/18 0628 02/06/18 0842 02/08/18 0810 02/10/18 0952  AST 33  --   --   --   ALT 24  --   --   --   ALKPHOS 82  --   --   --   BILITOT 1.0  --   --   --   PROT 8.2*  --   --   --   ALBUMIN 2.4* 2.5* 2.6* 2.6*   No results for input(s): LIPASE, AMYLASE in the last 168 hours. Recent Labs  Lab 02/04/18 0628  AMMONIA 40*   Coagulation Profile: Recent Labs  Lab 02/06/18 0440 02/07/18 0513 02/08/18 0515 02/09/18 0631 02/10/18 0832  INR 1.27 1.34 1.48 1.68 1.80   Cardiac Enzymes: No results for input(s): CKTOTAL, CKMB, CKMBINDEX, TROPONINI in the last 168 hours. BNP (last 3 results) No results for input(s): PROBNP in the last 8760 hours. HbA1C: No results for input(s): HGBA1C in the last 72 hours. CBG: No results for input(s): GLUCAP in the last 168 hours. Lipid Profile: No results for input(s): CHOL, HDL, LDLCALC, TRIG, CHOLHDL, LDLDIRECT in the last 72 hours. Thyroid Function Tests: No results for input(s): TSH, T4TOTAL, FREET4, T3FREE, THYROIDAB in the last 72 hours. Anemia Panel: No results for input(s): VITAMINB12, FOLATE, FERRITIN, TIBC, IRON, RETICCTPCT in the last 72 hours. Sepsis Labs: No results for input(s): PROCALCITON, LATICACIDVEN in the last 168 hours.  No results found for this or any previous visit (from the past 240 hour(s)).       Radiology Studies: No results  found.      Scheduled Meds: . allopurinol  150 mg Oral QODAY  . amiodarone  100 mg Oral Daily  . chlorhexidine  15 mL Mouth Rinse BID  . Chlorhexidine Gluconate Cloth  6 each Topical Q0600  . cinacalcet  90 mg Oral Q supper  . darbepoetin (ARANESP) injection - DIALYSIS  200 mcg Intravenous Q Mon-HD  . feeding supplement (NEPRO CARB STEADY)  237 mL Oral TID BM  . feeding supplement (PRO-STAT SUGAR FREE 64)  30 mL Oral BID  . mouth rinse  15  mL Mouth Rinse q12n4p  . midodrine  5 mg Oral TID WC  . mirtazapine  7.5 mg Oral QHS  . multivitamin  1 tablet Oral QHS  . pantoprazole  40 mg Oral BID  . sucroferric oxyhydroxide  1,500 mg Oral TID WC  . warfarin  15 mg Oral ONCE-1800  . Warfarin - Pharmacist Dosing Inpatient   Does not apply q1800   Continuous Infusions: . heparin 1,750 Units/hr (02/10/18 1205)     LOS: 15 days    Time spent: 25 minutes.     Hosie Poisson, MD [facility] Pager [phone]   If 7PM-7AM, please contact night-coverage [email or web address] Password [facility] 02/10/2018, 12:28 PM

## 2018-02-10 NOTE — Progress Notes (Addendum)
[facility] Progress Note   Subjective:  Seen in HD unit - just getting started, 3L UF planned. Still with mild R lower chest pleuritic pain, no overt dyspnea. Did have small amount of blood present in urine yesterday and today - not worsening. INR pending today.   Objective Vitals:   02/09/18 1327 02/09/18 1706 02/10/18 0526 02/10/18 0814  BP: (!) 157/56 135/67 (!) 146/74 (!) 155/76  Pulse: 83 81 78 80  Resp:  18 20 18   Temp:  98 F (36.7 C) 98.6 F (37 C) 97.7 F (36.5 C)  TempSrc:  Oral Oral Oral  SpO2: 100% 99% 99% 100%  Weight:      Height:       Physical Exam General:Well appearing man, NAD. Heart:RRR; no murmur Lungs:CTAanteriorly Extremities:No LE edema Dialysis Access:AVF + thrill  Additional Objective Labs: Basic Metabolic Panel: Recent Labs  Lab 02/03/18 1225  02/05/18 0637 02/06/18 0842 02/08/18 0810  NA 134*   < > 133* 133* 131*  K 3.2*   < > 4.1 4.3 3.9  CL 96*   < > 94* 94* 90*  CO2 25   < > 23 24 26   GLUCOSE 114*   < > 76 77 75  BUN 57*   < > 55* 71* 50*  CREATININE 8.84*   < > 10.16* 12.64* 11.38*  CALCIUM 10.1   < > 11.3* 11.7* 11.1*  PHOS 3.9  --   --  4.8* 4.6   < > = values in this interval not displayed.   Liver Function Tests: Recent Labs  Lab 02/04/18 0628 02/06/18 0842 02/08/18 0810  AST 33  --   --   ALT 24  --   --   ALKPHOS 82  --   --   BILITOT 1.0  --   --   PROT 8.2*  --   --   ALBUMIN 2.4* 2.5* 2.6*   CBC: Recent Labs  Lab 02/05/18 0637 02/06/18 0440 02/07/18 0513 02/08/18 0515 02/09/18 0631  WBC 10.1 9.3 10.0 9.3 8.3  HGB 8.8* 8.2* 8.6* 9.0* 8.8*  HCT 28.8* 26.0* 27.7* 28.7* 28.6*  MCV 98.3 97.0 98.2 97.3 99.3  PLT 269 275 249 281 237   Blood Culture    Component Value Date/Time   SDES BLOOD RIGHT ANTECUBITAL 01/26/2018 2045   SDES BLOOD RIGHT HAND 01/26/2018 2045   SPECREQUEST  01/26/2018 2045    BOTTLES DRAWN AEROBIC AND ANAEROBIC Blood Culture adequate volume   SPECREQUEST   01/26/2018 2045    BOTTLES DRAWN AEROBIC ONLY Blood Culture results may not be optimal due to an inadequate volume of blood received in culture bottles   CULT  01/26/2018 2045    NO GROWTH 5 DAYS Performed at [facility], [place] [address], [place], [place] [address]    CULT  01/26/2018 2045    NO GROWTH 5 DAYS Performed at [facility], [place] [address] [place], [place] [address]    REPTSTATUS 01/31/2018 FINAL 01/26/2018 2045   REPTSTATUS 01/31/2018 FINAL 01/26/2018 2045   Medications: . heparin 1,750 Units/hr (02/09/18 0618)   . allopurinol  150 mg Oral QODAY  . amiodarone  100 mg Oral Daily  . chlorhexidine  15 mL Mouth Rinse BID  . Chlorhexidine Gluconate Cloth  6 each Topical Q0600  . cinacalcet  90 mg Oral Q supper  . darbepoetin (ARANESP) injection - DIALYSIS  200 mcg Intravenous Q Mon-HD  . feeding supplement (NEPRO CARB STEADY)  237 mL Oral TID AC  . feeding supplement (PRO-STAT SUGAR FREE 64)  30 mL Oral BID  . mouth rinse  15 mL Mouth Rinse q12n4p  . midodrine  5 mg Oral TID WC  . mirtazapine  7.5 mg Oral QHS  . multivitamin  1 tablet Oral QHS  . pantoprazole  40 mg Oral BID  . sucroferric oxyhydroxide  1,500 mg Oral TID WC  . Warfarin - Pharmacist Dosing Inpatient   Does not apply q1800    Dialysis Orders: MWF at [facility] 4hr, 450/800, EDW 111.5kg, 2K/2.25Ca, #4/linear Na, AVF, Hep 4500 + 1500 - Hectoral 2mcg IV q HD - Parsabiv 12.5mg  IV q HD - Mircera 31mcg q 2 weeks  Assessment/Plan: 1.R Pulmonary emboli: S/p IVC filter d/t PE &L DVT. On heparin drip &warfarin; INR 1.68. Per hospitalist, aiming for low side INR goal.  Reports minimal amount (drops) of bloody urine 1-2 x daily, this is not a sign of re-bleeding rather this is old blood from the renal bleed in August slowing clearing out of the GU tract.  2. ESRD: Continue HD per MWF schedule,HD today. 3. Anemia of CKD: Hgb8.8 - continue Aranesp 237mcg qwk (Mon). Follow trends. 4. Secondary  hyperparathyroidism: [place] but a little lower- possibly d/t immobility? Tremors and AMS resolved. [id number].Started on Sensipar 60mg , then ^d to 90mg .VDRA d/c due to hypercalcemia.Binders changed from [place] to [place]/t diarrhea. 5. HTN/volume -BP variable. On midodrine. Significantly under edw, Eliane Decree, continue to titrate down volume,will need to establish new EDW prior to d/c. 6. Nutrition:Alb 2.6.Continue Nepro and Pro-stat supps. 7. L renal hemorrhage/hematoma - stable per CT 9/11 8. Hx A flutter - on amiodarone 9. 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   Veneta Penton, PA-C 02/10/2018, 8:54 AM  [facility] Pager: [phone]  Pt seen, examined, agree w assess/plan as above with additions as indicated.  Kelly Splinter MD [employer] pager [phone]    cell [phone] 02/10/2018, 11:38 AM

## 2018-02-10 NOTE — Progress Notes (Signed)
Physical Therapy Treatment Patient Details Name: [name] MRN: [id number] DOB: 1961-01-02 Today's Date: 02/10/2018    History of Present Illness 57 year old male with a history of ESRD on hemodialysis Monday Wednesday Friday, hypertension, CVA, GERD, gout, atrial fibrillation not on anticoagulation, systolic CHF with EF 96%, CAD, and status post left nephrectomy for spontaneous and life threatening hemorrhage in the left kidney in the setting of supratherapeutic INR who came to hospital with worsening shortness of breath; Found PE, IVC filter placed    PT Comments    Patient continues to make gains in activity tolerance and functional mobility. Ambulating 240 feet with walker and supervision. Still continue to recommend walker use for longer ambulation distances to promote increased gait speed and stability.     Follow Up Recommendations  No PT follow up     Equipment Recommendations  Rolling walker with 5" wheels    Recommendations for Other Services       Precautions / Restrictions Precautions Precaution Comments: slight fall risk Restrictions Weight Bearing Restrictions: No    Mobility  Bed Mobility Overal bed mobility: Modified Independent                Transfers Overall transfer level: Modified independent Equipment used: None Transfers: Sit to/from Stand Sit to Stand: Modified independent (Device/Increase time)            Ambulation/Gait Ambulation/Gait assistance: Supervision Gait Distance (Feet): 240 Feet Assistive device: Rolling walker (2 wheeled) Gait Pattern/deviations: WFL(Within Functional Limits)     General Gait Details: Patient able to self cue for activity tolerance and pacing   Stairs             Wheelchair Mobility    Modified Rankin (Stroke Patients Only)       Balance Overall balance assessment: No apparent balance deficits (not formally assessed)                                           Cognition Arousal/Alertness: Awake/alert Behavior During Therapy: WFL for tasks assessed/performed Overall Cognitive Status: Within Functional Limits for tasks assessed                                        Exercises      General Comments        Pertinent Vitals/Pain Pain Assessment: Faces Faces Pain Scale: Hurts a little bit Pain Location: continued right chest discomfort with inhalation (pleuritic in nature); MD aware Pain Descriptors / Indicators: Discomfort;Burning Pain Intervention(s): Monitored during session    Home Living                      Prior Function            PT Goals (current goals can now be found in the care plan section) Acute Rehab PT Goals Patient Stated Goal: to get stronger PT Goal Formulation: With patient Time For Goal Achievement: 02/21/18 Potential to Achieve Goals: Good Progress towards PT goals: Progressing toward goals    Frequency    Min 3X/week      PT Plan Current plan remains appropriate    Co-evaluation              AM-PAC PT "6 Clicks" Daily Activity  Outcome Measure  Difficulty turning over in  bed (including adjusting bedclothes, sheets and blankets)?: None Difficulty moving from lying on back to sitting on the side of the bed? : None Difficulty sitting down on and standing up from a chair with arms (e.g., wheelchair, bedside commode, etc,.)?: None Help needed moving to and from a bed to chair (including a wheelchair)?: A Little Help needed walking in hospital room?: A Little Help needed climbing 3-5 steps with a railing? : A Little 6 Click Score: 21    End of Session Equipment Utilized During Treatment: Gait belt Activity Tolerance: Patient tolerated treatment well Patient left: with call bell/phone within reach;in bed Nurse Communication: Mobility status PT Visit Diagnosis: Unsteadiness on feet (R26.81);Muscle weakness (generalized) (M62.81)     Time: 8003-4917 PT Time  Calculation (min) (ACUTE ONLY): 16 min  Charges:  $Therapeutic Activity: 8-22 mins                     Ellamae Sia, PT, DPT Acute Rehabilitation Services Pager [phone] Office [phone]    Willy Eddy 02/10/2018, 3:59 PM

## 2018-02-10 NOTE — Progress Notes (Signed)
ANTICOAGULATION CONSULT NOTE - Follow Up Consult  Pharmacy Consult for Heparin and Warfarin Indication: pulmonary embolus  Allergies  Allergen Reactions  . Ace Inhibitors Itching and Cough    Patient Measurements: Height: 6\' 1"  (185.4 cm) Weight: 236 lb 1.8 oz (107.1 kg) IBW/kg (Calculated) : 79.9 Heparin Dosing Weight: 102 kg  Vital Signs: Temp: 97.6 F (36.4 C) (09/20 0845) Temp Source: Oral (09/20 0845) BP: 121/64 (09/20 1030) Pulse Rate: 70 (09/20 1030)  Labs: Recent Labs    02/08/18 0515 02/08/18 0810 02/09/18 0630 02/09/18 0631 02/10/18 0832  HGB 9.0*  --   --  8.8* 9.0*  HCT 28.7*  --   --  28.6* 29.0*  PLT 281  --   --  237 269  LABPROT 17.8*  --   --  19.6* 20.7*  INR 1.48  --   --  1.68 1.80  HEPARINUNFRC 0.39  --  0.32  --  0.40  CREATININE  --  11.38*  --   --   --    Assessment:  73 yom who has known hx of ESRD (MWF), atrial fibrillation and is s/p L nephrectomy for spontaneous hemorrhage in L kidney (01/13/18) in setting of supratherapeutic INR on warfarin. Off anticoagulation since that time.  CTA 01/26/18 found probable R upper lobe PE without R heart strain. IVC filter placed 9/6.  Prior pharmacist discussed with MD and given high bleed risk will be conservative in heparin dosing with reduced goal and no bolus despite known PE. Warfarin resumed 9/15 PM.  Heparin level this morning remains therapeutic (HL 0.4, goal of 0.3-0.5). INR remains SUBtherapeutic but is trending up nicely (INR 1.8 << 1.68, goal of 2-3). Hgb low but stable, plts wnl. No bleeding noted at this time. Will increase the warfarin dose slightly today.  Goal of Therapy:  Heparin level 0.3-0.5 units/ml (lower end of therapeutic range) Monitor platelets by anticoagulation protocol: Yes   Plan:  - Warfarin 15 mg x 1 dose at 1800 today - Continue Heparin drip at 1750 units/hr - Daily heparin level, INR, and CBC. - Monitor for any s/sx bleeding.  Thank you for allowing pharmacy to be a  part of this patient's care.  Alycia Rossetti, PharmD, BCPS Clinical Pharmacist Pager: [phone] Clinical phone for 02/10/2018 from 7a-3:30p: [phone] If after 3:30p, please call main pharmacy at: [phone] Please check AMION for all [place] numbers 02/10/2018 11:05 AM

## 2018-02-11 DIAGNOSIS — Z992 Dependence on renal dialysis: Secondary | ICD-10-CM

## 2018-02-11 DIAGNOSIS — J189 Pneumonia, unspecified organism: Secondary | ICD-10-CM

## 2018-02-11 DIAGNOSIS — N186 End stage renal disease: Secondary | ICD-10-CM

## 2018-02-11 DIAGNOSIS — I482 Chronic atrial fibrillation: Secondary | ICD-10-CM

## 2018-02-11 DIAGNOSIS — N2889 Other specified disorders of kidney and ureter: Secondary | ICD-10-CM

## 2018-02-11 LAB — HEPARIN LEVEL (UNFRACTIONATED): Heparin Unfractionated: 0.52 IU/mL (ref 0.30–0.70)

## 2018-02-11 LAB — CBC
HCT: 30.7 % — ABNORMAL LOW (ref 39.0–52.0)
Hemoglobin: 9.3 g/dL — ABNORMAL LOW (ref 13.0–17.0)
MCH: 30.6 pg (ref 26.0–34.0)
MCHC: 30.3 g/dL (ref 30.0–36.0)
MCV: 101 fL — ABNORMAL HIGH (ref 78.0–100.0)
Platelets: 242 10*3/uL (ref 150–400)
RBC: 3.04 MIL/uL — ABNORMAL LOW (ref 4.22–5.81)
RDW: 18.4 % — ABNORMAL HIGH (ref 11.5–15.5)
WBC: 8.6 10*3/uL (ref 4.0–10.5)

## 2018-02-11 LAB — PROTIME-INR
INR: 2.17
Prothrombin Time: 24 seconds — ABNORMAL HIGH (ref 11.4–15.2)

## 2018-02-11 MED ORDER — WARFARIN SODIUM 2.5 MG PO TABS: 12.5000 mg | ORAL_TABLET | Freq: Every day | ORAL | Status: DC

## 2018-02-11 MED ORDER — PRO-STAT SUGAR FREE PO LIQD: 30.0000 mL | Freq: Two times a day (BID) | ORAL | 0 refills | Status: DC

## 2018-02-11 MED ORDER — NEPRO/CARBSTEADY PO LIQD: 237.0000 mL | Freq: Three times a day (TID) | ORAL | 0 refills | Status: AC

## 2018-02-11 MED ORDER — WARFARIN SODIUM 7.5 MG PO TABS: 15.0000 mg | ORAL_TABLET | Freq: Once | ORAL | Status: DC

## 2018-02-11 MED ORDER — WARFARIN SODIUM 2.5 MG PO TABS: 12.5000 mg | ORAL_TABLET | Freq: Once | ORAL | Status: DC

## 2018-02-11 MED ORDER — SUCROFERRIC OXYHYDROXIDE 500 MG PO CHEW: 1500.0000 mg | CHEWABLE_TABLET | Freq: Three times a day (TID) | ORAL | 1 refills | Status: DC

## 2018-02-11 MED ORDER — CINACALCET HCL 30 MG PO TABS: 90.0000 mg | ORAL_TABLET | Freq: Every day | ORAL | 1 refills | Status: DC

## 2018-02-11 MED ORDER — DM-GUAIFENESIN ER 30-600 MG PO TB12: 1.0000 | ORAL_TABLET | Freq: Two times a day (BID) | ORAL | 0 refills | Status: DC | PRN

## 2018-02-11 MED ORDER — WARFARIN SODIUM 2.5 MG PO TABS: 12.5000 mg | ORAL_TABLET | Freq: Every day | ORAL | 1 refills | Status: DC

## 2018-02-11 MED ORDER — WARFARIN SODIUM 2.5 MG PO TABS: 12.5000 mg | ORAL_TABLET | Freq: Once | ORAL | 0 refills | Status: DC

## 2018-02-11 NOTE — Progress Notes (Signed)
[name] to be D/C'd Home per MD order.  Discussed prescriptions and follow up appointments with the patient. Prescriptions given to patient, medication list explained in detail. Pt verbalized understanding.  Allergies as of 02/11/2018      Reactions   Ace Inhibitors Itching, Cough      Medication List    TAKE these medications   allopurinol 300 MG tablet Commonly known as:  ZYLOPRIM Take 150 mg by mouth every other day.   amiodarone 200 MG tablet Commonly known as:  PACERONE Take 0.5 tablets (100 mg total) by mouth daily.   AURYXIA 1 GM 210 MG(Fe) tablet Generic drug:  ferric citrate Take 210 mg by mouth 3 (three) times daily with meals.   calcium acetate 667 MG capsule Commonly known as:  PHOSLO Take 667-2,001 mg by mouth See admin instructions. Take 3 capsules with meals and 1 capsule with snacks.   cinacalcet 30 MG tablet Commonly known as:  SENSIPAR Take 3 tablets (90 mg total) by mouth daily with supper.   dextromethorphan-guaiFENesin 30-600 MG 12hr tablet Commonly known as:  MUCINEX DM Take 1 tablet by mouth 2 (two) times daily as needed for cough.   feeding supplement (NEPRO CARB STEADY) Liqd Take 237 mLs by mouth 3 (three) times daily between meals.   feeding supplement (PRO-STAT SUGAR FREE 64) Liqd Take 30 mLs by mouth 2 (two) times daily.   lidocaine-prilocaine cream Commonly known as:  EMLA Apply 1 application topically See admin instructions. Apply topically to port access prior to dialysis - Monday, Wednesday, Friday   midodrine 10 MG tablet Commonly known as:  PROAMATINE Take 0.5-1 tablets (5-10 mg total) by mouth See admin instructions. Take one tablet (10 mg) by mouth before dialysis (Monday, Wednesday, Friday), take 1/2 tablet (5 mg) on non-dialysis days (Sunday, Tuesday, Thursday, Saturday) as needed for diastolic blood pressure <95.   mirtazapine 7.5 MG tablet Commonly known as:  REMERON Take 1 tablet (7.5 mg total) by mouth at bedtime.    multivitamin Tabs tablet Take 1 tablet by mouth at bedtime.   nitroGLYCERIN 0.4 MG SL tablet Commonly known as:  NITROSTAT Place 0.4 mg under the tongue every 5 (five) minutes as needed for chest pain.   pantoprazole 40 MG tablet Commonly known as:  PROTONIX Take 40 mg by mouth 2 (two) times daily.   sucroferric oxyhydroxide 500 MG chewable tablet Commonly known as:  VELPHORO Chew 3 tablets (1,500 mg total) by mouth 3 (three) times daily with meals.   warfarin 2.5 MG tablet Commonly known as:  COUMADIN Take 5 tablets (12.5 mg total) by mouth daily at 6 PM.            Durable Medical Equipment  (From admission, onward)         Start     Ordered   02/11/18 0750  For home use only DME Walker rolling  Once    Question:  Patient needs a walker to treat with the following condition  Answer:  Debility   02/11/18 0749          Vitals:   02/11/18 0905 02/11/18 1300  BP: 106/60   Pulse: 80   Resp: 18   Temp: 98.5 F (36.9 C)   SpO2: 100% 100%    Skin clean, dry and intact without evidence of skin break down, no evidence of skin tears noted. IV catheter discontinued intact. Site without signs and symptoms of complications. Dressing and pressure applied. Pt denies pain at this time.  No complaints noted.  An After Visit Summary was printed and given to the patient. Patient escorted via [place], and D/C home via private auto.   PT DID NOT WAIT FOR WALKER TO BE DELIVERED TO THE HOSPITAL. CASE MANAGER, DEBBIE CALLED [facility] AND THEY DID NOT RESPOND. I CALLED THEM AND LEFT A VOICEMAIL. NO CALL WAS RETURNED. PT DECIDED TO DC WITHOUT WALKER-ADVISED TO VISIT [facility] FOR WALKER.   [name], RN [facility] 21M Phone [phone]

## 2018-02-11 NOTE — Progress Notes (Addendum)
ANTICOAGULATION CONSULT NOTE - Follow Up Consult  Pharmacy Consult for Heparin and Warfarin Indication: pulmonary embolus  Allergies  Allergen Reactions  . Ace Inhibitors Itching and Cough    Patient Measurements: Height: 6\' 1"  (185.4 cm) Weight: 230 lb 9.6 oz (104.6 kg) IBW/kg (Calculated) : 79.9 Heparin Dosing Weight: 102 kg  Vital Signs: Temp: 98.4 F (36.9 C) (09/21 0447) Temp Source: Oral (09/21 0447) BP: 130/68 (09/21 0447) Pulse Rate: 74 (09/21 0447)  Labs: Recent Labs    02/08/18 0810 02/09/18 0630  02/09/18 0631 02/10/18 7793 02/10/18 0952 02/11/18 0558  HGB  --   --    < > 8.8* 9.0*  --  9.3*  HCT  --   --   --  28.6* 29.0*  --  30.7*  PLT  --   --   --  237 269  --  242  LABPROT  --   --   --  19.6* 20.7*  --  24.0*  INR  --   --   --  1.68 1.80  --  2.17  HEPARINUNFRC  --  0.32  --   --  0.40  --  0.52  CREATININE 11.38*  --   --   --   --  9.62*  --    < > = values in this interval not displayed.   Assessment: 15 yom who has known hx of ESRD (MWF), atrial fibrillation and is s/p L nephrectomy for spontaneous hemorrhage in L kidney (01/13/18) in setting of supratherapeutic INR on warfarin. Off anticoagulation since that time.  CTA 01/26/18 found probable R upper lobe PE without R heart strain. IVC filter placed 9/6.  Prior pharmacist discussed with MD and given high bleed risk will be conservative in heparin dosing with reduced goal and no bolus despite known PE. Warfarin resumed 9/15 PM.  9/21:  Heparin level this morning was SUPRAtherapeutic (HL 0.52, goal of 0.3-0.5).  INR is therapeutic this morning at 2.17, up from 1.8. (INR goal of 2-3).  Hgb low but stable, plts wnl.   Spoke with nurse no bleeding today.  Will continue with warfarin dosing today and stop heparin drip.  Warfarin Drug-Drug Interactions: Amodarone + Warfarin - enhanced anticoagulant effect of warfarin; monitor CLOSELY  Goal of Therapy:  INR 2-3 Monitor platelets by  anticoagulation protocol: Yes   Plan:  - Warfarin 12.5 mg x 1 dose at 1800 today - Discontinue Heparin drip - Daily INR, and CBC. - Monitor for any s/sx bleeding.   Thank you for allowing pharmacy to be a part of this patient's care.  Tamela Gammon, PharmD 02/11/2018 7:33 AM PGY-1 Pharmacy Resident Direct Phone for 9/21: [phone]  Please check [email or web address] for unit-specific pharmacist phone numbers

## 2018-02-11 NOTE — Progress Notes (Signed)
Ref: Iona Beard, MD   Subjective:  Feeling same. INR-2.17  Objective:  Vital Signs in the last 24 hours: Temp:  [97.6 F (36.4 C)-99.5 F (37.5 C)] 98.5 F (36.9 C) (09/21 0905) Pulse Rate:  [66-86] 80 (09/21 0905) Resp:  [16-18] 18 (09/21 0905) BP: (106-167)/(60-77) 106/60 (09/21 0905) SpO2:  [98 %-100 %] 100 % (09/21 0905) Weight:  [104.6 kg] 104.6 kg (09/20 1251)  Physical Exam: BP Readings from Last 1 Encounters:  02/11/18 106/60     Wt Readings from Last 1 Encounters:  02/10/18 104.6 kg    Weight change:  Body mass index is 30.42 kg/m. HEENT: [place]/AT, Eyes-Brown, PERL, EOMI, Conjunctiva-Pale, Sclera-Non-icteric Neck: No JVD, No bruit, Trachea midline. Lungs:  Clear, Bilateral. Cardiac:  Regular rhythm, normal S1 and S2, no S3. II/VI systolic murmur. Abdomen:  Soft, non-tender. BS present. Extremities:  No edema present. No cyanosis. No clubbing. LUE AVF. CNS: AxOx3, Cranial nerves grossly intact, moves all 4 extremities.  Skin: Warm and dry.   Intake/Output from previous day: 09/20 0701 - 09/21 0700 In: 0  Out: 2500     Lab Results: BMET    Component Value Date/Time   NA 132 (L) 02/10/2018 0952   NA 131 (L) 02/08/2018 0810   NA 133 (L) 02/06/2018 0842   K 3.8 02/10/2018 0952   K 3.9 02/08/2018 0810   K 4.3 02/06/2018 0842   CL 92 (L) 02/10/2018 0952   CL 90 (L) 02/08/2018 0810   CL 94 (L) 02/06/2018 0842   CO2 26 02/10/2018 0952   CO2 26 02/08/2018 0810   CO2 24 02/06/2018 0842   GLUCOSE 73 02/10/2018 0952   GLUCOSE 75 02/08/2018 0810   GLUCOSE 77 02/06/2018 0842   BUN 35 (H) 02/10/2018 0952   BUN 50 (H) 02/08/2018 0810   BUN 71 (H) 02/06/2018 0842   CREATININE 9.62 (H) 02/10/2018 0952   CREATININE 11.38 (H) 02/08/2018 0810   CREATININE 12.64 (H) 02/06/2018 0842   CALCIUM 10.6 (H) 02/10/2018 0952   CALCIUM 11.1 (H) 02/08/2018 0810   CALCIUM 11.7 (H) 02/06/2018 0842   GFRNONAA 5 (L) 02/10/2018 0952   GFRNONAA 4 (L) 02/08/2018 0810   GFRNONAA 4 (L) 02/06/2018 0842   GFRAA 6 (L) 02/10/2018 0952   GFRAA 5 (L) 02/08/2018 0810   GFRAA 5 (L) 02/06/2018 0842   CBC    Component Value Date/Time   WBC 8.6 02/11/2018 0558   RBC 3.04 (L) 02/11/2018 0558   HGB 9.3 (L) 02/11/2018 0558   HCT 30.7 (L) 02/11/2018 0558   PLT 242 02/11/2018 0558   MCV 101.0 (H) 02/11/2018 0558   MCH 30.6 02/11/2018 0558   MCHC 30.3 02/11/2018 0558   RDW 18.4 (H) 02/11/2018 0558   LYMPHSABS 0.8 01/21/2018 0936   MONOABS 1.2 (H) 01/21/2018 0936   EOSABS 0.2 01/21/2018 0936   BASOSABS 0.1 01/21/2018 0936   HEPATIC Function Panel Recent Labs    01/28/18 0256 01/31/18 0436 02/04/18 0628  PROT 7.4 8.2* 8.2*   HEMOGLOBIN A1C No components found for: HGA1C,  MPG CARDIAC ENZYMES Lab Results  Component Value Date   CKTOTAL 136 03/16/2017   CKMB 4.3 (H) 05/20/2011   TROPONINI 0.20 (HH) 01/13/2018   TROPONINI 0.21 (HH) 01/13/2018   TROPONINI 0.27 (HH) 01/13/2018   BNP No results for input(s): PROBNP in the last 8760 hours. TSH No results for input(s): TSH in the last 8760 hours. CHOLESTEROL No results for input(s): CHOL in the last 8760 hours.  Scheduled Meds: . allopurinol  150 mg Oral QODAY  . amiodarone  100 mg Oral Daily  . chlorhexidine  15 mL Mouth Rinse BID  . Chlorhexidine Gluconate Cloth  6 each Topical Q0600  . cinacalcet  90 mg Oral Q supper  . darbepoetin (ARANESP) injection - DIALYSIS  200 mcg Intravenous Q Mon-HD  . feeding supplement (NEPRO CARB STEADY)  237 mL Oral TID BM  . feeding supplement (PRO-STAT SUGAR FREE 64)  30 mL Oral BID  . mouth rinse  15 mL Mouth Rinse q12n4p  . midodrine  5 mg Oral TID WC  . mirtazapine  7.5 mg Oral QHS  . multivitamin  1 tablet Oral QHS  . pantoprazole  40 mg Oral BID  . sucroferric oxyhydroxide  1,500 mg Oral TID WC  . warfarin  15 mg Oral ONCE-1800  . Warfarin - Pharmacist Dosing Inpatient   Does not apply q1800   Continuous Infusions: PRN Meds:.acetaminophen, albuterol,  benzonatate, dextromethorphan-guaiFENesin, hydrALAZINE, loperamide, nitroGLYCERIN, ondansetron (ZOFRAN) IV, oxyCODONE-acetaminophen, zolpidem  Assessment/Plan: Acute PE Left leg DVT S/P IVC filter ESRD Anemia of CKD HTN Hyperparathyroidism S/P left renal hematoma and hemorrhage Hematuria Paroxysmal atrial flutter Non-ischemic cardiomyopathy Obesity  Agree with discontinuing heparin May discharge home from cardiology stand point. F/U in 4 days for PT/INR at warfarin dose of 12 to 15 mg. Daily and INR goal of 2-2.5.   LOS: 16 days    Dixie Dials  MD  02/11/2018, 10:00 AM

## 2018-02-11 NOTE — Progress Notes (Addendum)
[name] Progress Note   Subjective: Awake, alert oriented X 3. Hoping to go home today. Says he has walked in hallways, feel better. Denies SOB, says he has occasional "twinge" in RUQ of abdomen.     Objective Vitals:   02/10/18 1834 02/10/18 2108 02/11/18 0447 02/11/18 0905  BP: 118/75 126/69 130/68 106/60  Pulse:  86 74 80  Resp:  18 18 18   Temp:  99.5 F (37.5 C) 98.4 F (36.9 C) 98.5 F (36.9 C)  TempSrc:  Oral Oral Oral  SpO2:  100% 99% 100%  Weight:      Height:       Physical Exam General: WN,WD NAD Heart: S1,S2, RRR Lungs: CTAB A/P Abdomen: Active BS, NT, ND.  Extremities: no LE edema Dialysis Access: L AVF aneursymal + bruit   Additional Objective Labs: Basic Metabolic Panel: Recent Labs  Lab 02/06/18 0842 02/08/18 0810 02/10/18 0952  NA 133* 131* 132*  K 4.3 3.9 3.8  CL 94* 90* 92*  CO2 24 26 26   GLUCOSE 77 75 73  BUN 71* 50* 35*  CREATININE 12.64* 11.38* 9.62*  CALCIUM 11.7* 11.1* 10.6*  PHOS 4.8* 4.6 4.4   Liver Function Tests: Recent Labs  Lab 02/06/18 0842 02/08/18 0810 02/10/18 0952  ALBUMIN 2.5* 2.6* 2.6*   No results for input(s): LIPASE, AMYLASE in the last 168 hours. CBC: Recent Labs  Lab 02/07/18 0513 02/08/18 0515 02/09/18 0631 02/10/18 0832 02/11/18 0558  WBC 10.0 9.3 8.3 8.3 8.6  HGB 8.6* 9.0* 8.8* 9.0* 9.3*  HCT 27.7* 28.7* 28.6* 29.0* 30.7*  MCV 98.2 97.3 99.3 100.0 101.0*  PLT 249 281 237 269 242   Blood Culture    Component Value Date/Time   SDES BLOOD RIGHT ANTECUBITAL 01/26/2018 2045   SDES BLOOD RIGHT HAND 01/26/2018 2045   SPECREQUEST  01/26/2018 2045    BOTTLES DRAWN AEROBIC AND ANAEROBIC Blood Culture adequate volume   SPECREQUEST  01/26/2018 2045    BOTTLES DRAWN AEROBIC ONLY Blood Culture results may not be optimal due to an inadequate volume of blood received in culture bottles   CULT  01/26/2018 2045    NO GROWTH 5 DAYS Performed at [facility], [place] [address]  [place], [place] [address]    CULT  01/26/2018 2045    NO GROWTH 5 DAYS Performed at [facility], [place] [address], [place], [place] [address]    REPTSTATUS 01/31/2018 FINAL 01/26/2018 2045   REPTSTATUS 01/31/2018 FINAL 01/26/2018 2045    Cardiac Enzymes: No results for input(s): CKTOTAL, CKMB, CKMBINDEX, TROPONINI in the last 168 hours. CBG: No results for input(s): GLUCAP in the last 168 hours. Iron Studies: No results for input(s): IRON, TIBC, TRANSFERRIN, FERRITIN in the last 72 hours. @lablastinr3 @ Studies/Results: No results found. Medications:  . allopurinol  150 mg Oral QODAY  . amiodarone  100 mg Oral Daily  . chlorhexidine  15 mL Mouth Rinse BID  . Chlorhexidine Gluconate Cloth  6 each Topical Q0600  . cinacalcet  90 mg Oral Q supper  . darbepoetin (ARANESP) injection - DIALYSIS  200 mcg Intravenous Q Mon-HD  . feeding supplement (NEPRO CARB STEADY)  237 mL Oral TID BM  . feeding supplement (PRO-STAT SUGAR FREE 64)  30 mL Oral BID  . mouth rinse  15 mL Mouth Rinse q12n4p  . midodrine  5 mg Oral TID WC  . mirtazapine  7.5 mg Oral QHS  . multivitamin  1 tablet Oral QHS  .  pantoprazole  40 mg Oral BID  . sucroferric oxyhydroxide  1,500 mg Oral TID WC  . warfarin  15 mg Oral ONCE-1800  . Warfarin - Pharmacist Dosing Inpatient   Does not apply q1800     Dialysis Orders: MWF at [facility] 4hr, 450/800, EDW 111.5kg, 2K/2.25Ca, #4/linear Na, AVF, Hep 4500 + 1500 - Hectoral 73mcg IV q HD - Parsabiv 12.5mg  IV q HD - Mircera 22mcg q 2 weeks  Assessment/Plan: 1.R Pulmonary emboli: S/p IVC filter d/t PE &L DVT. Heparin DC'd, continue warfarin; INR 2.17 Per hospitalist, aiming for low side INR goal.  Reports minimal amount (drops) of bloody urine 1-2 x daily, this is not a sign of re-bleeding rather this is old blood from the renal bleed in August slowing clearing out of the GU tract.  2. ESRD: Continue HD per MWF schedule. Next HD 02/13/2018 3. Anemia of CKD: Hgb9.3-  continue Aranesp 245mcg qwk (Mon). Follow trends. 4. Secondary hyperparathyroidism: Ca 10.6 C Ca 11.7 Change to 2.0 Ca bath. Phos at goal.  Tremors and AMS resolved. [id number].Started on Sensipar 60mg , then ^d to 90mg .VDRA d/c due to hypercalcemia.Binders changed from [place] to Velphorod/t diarrhea.  5. HTN/volume -BP variable on low side. On midodrine. HD 09/20 Pre wt 107.1 Net UF 2.5 Post wt 104.6. Lower EDW on DC.  6. Nutrition:Alb 2.6.Continue Nepro and Pro-stat supps. 7. L renal hemorrhage/hematoma - stable per CT 9/11 8. Hx A flutter - on amiodarone 9. NICM (EF 30-35%) 10.Severe debility-  from prolonged illness +/- mild depression:PT/OT on board.  11. Disposition: Primary team concerned about potential for hemorrhage with anticoagulation. Will stay until INR therapeutic. Patient hoping to go home today.    Rita H. Brown NP-C 02/11/2018, 9:12 AM  [facility] [phone]  Pt seen, examined and agree w A/P as above.  Kelly Splinter MD [employer] pager [phone]   02/11/2018, 11:38 AM

## 2018-02-11 NOTE — Discharge Summary (Signed)
Discharge Summary  [name] PZW:[id number] DOB: [birth year]  PCP: Iona Beard, MD  Admit date: 01/26/2018 Discharge date: 02/11/2018   Time spent: < 25 minutes  Admitted From: home Disposition:  home  Recommendations for Outpatient Follow-up:  1. Follow up with PCP in 1 week 2.  Follow up with cardiology in 4 days for INR check, warfarin 12.5 mg daily until then 3.     Discharge Diagnoses:  Active Hospital Problems   Diagnosis Date Noted  . Acute respiratory failure with hypoxia ([place]) 01/13/2015  . Malnutrition of moderate degree 02/10/2018  . Anemia 01/27/2018  . HCAP (healthcare-associated pneumonia) 01/26/2018  . CAD (coronary artery disease) 01/26/2018  . Atrial fibrillation, chronic ([place]) 01/26/2018  . Depression 01/26/2018  . GERD (gastroesophageal reflux disease) 01/26/2018  . Pulmonary embolism ([place]) 01/26/2018  . Hemorrhage of left kidney 01/13/2018  . ESRD (end stage renal disease) on dialysis ([place]) 11/28/2012  . Gout, chronic 05/19/2011  . Chronic combined systolic (congestive) and diastolic (congestive) heart failure ([place]) 05/19/2011    Class: Acute    Resolved Hospital Problems  No resolved problems to display.    Discharge Condition: Stable  CODE STATUS: Full code  History of present illness:  [name] is a 57 y.o. year old male with medical history significant for ESRD on HD, HTN, stroke, GERD, gout, A. fib  recent admission for spontaneous hemorrhage of left kidney in the setting of supratherapeutic INR and hemorrhagic shock status post embolization by IR on 8/23 and discharged on 9/2 who presented on 01/26/2018 with worsening shortness of breath and was found to have acute right pulmonary embolism. Remaining hospital course addressed in problem based format below:   Hospital Course:   Acute hypoxic respiratory failure secondary to acute pulmonary embolism.  Status post IVC filter placement on 9/6.  Initially was not started on anticoagulation due  to recent renal hemorrhage, but continued to need BiPAp for persistent tachypnea and dyspnea. after discussion with urology patient was started on IV heparin and bridged to warfarin until achieved therapeutic INR and ensured no bleeding episodes and stable hemoglobin.  Normal oxygen saturation on ambulatory testing prior to discharge. Patient is not a DOAC candidate due to his ESRD, and need for easy rapid reversal due to his high risk of bleeding and recent life-threatening hemorrhage from previous hospital stay.  Cardiology was consulted who assisted with warfarin dosing.  Patient will follow-up with cardiology in 3 to 4 days after discharge for INR follow-up for titration of Coumadin  HCAP, elevated procalcitonin, elevated lactic acid, and CTA concerning for possible pneumonia.  Patient was empirically started on vancomycin and cefepime continued from a/5-9/11.  Chronic atrial fibrillation.  Continue amiodarone.  Previously warfarin was discontinued after recent admission for life-threatening spontaneous hemorrhage of left kidney in the setting of supratherapeutic INR.  Warfarin was reinitiated during his hospital stay due to acute PE and DVT with assistance from cardiology.  Patient will continue warfarin 12.5 mg on discharge with an INR follow-up with cardiology in 3 to 4 days after discharge.  Recent history of hemorrhage left kidney, discharged on 01/23/2018.  In the setting of supratherapeutic INR.  CT scan of abdomen this admission showed unchanged appearance and hemoglobin remained stable.  Discussed case with urology who recommended starting IV heparin during hospital stay as plan was for patient to restart anticoagulation 7 days after recent discharge.   Consultations:  Cardiology, pulmonology, urology  Procedures/Studies: IVC, 9/6 Venous duplex- L acute DVT, 9/6  Discharge Exam:  BP 106/60 (BP Location: Right Leg)   Pulse 80   Temp 98.5 F (36.9 C) (Oral)   Resp 18   Ht 6\' 1"  (1.854  m)   Wt 104.6 kg   SpO2 100%   BMI 30.42 kg/m   General: Lying in bed, no apparent distress Eyes: EOMI, anicteric ENT: Oral Mucosa clear and moist Cardiovascular: regular rate and rhythm, no murmurs, rubs or gallops, no edema, Respiratory: Normal respiratory effort, lungs clear to auscultation bilaterally Abdomen: soft, non-distended, non-tender, normal bowel sounds Skin: No Rash Neurologic: Grossly no focal neuro deficit.Mental status AAOx3, speech normal, Psychiatric:Appropriate affect, and mood   Discharge Instructions You were cared for by a hospitalist during your hospital stay. If you have any questions about your discharge medications or the care you received while you were in the hospital after you are discharged, you can call the unit and asked to speak with the hospitalist on call if the hospitalist that took care of you is not available. Once you are discharged, your primary care physician will handle any further medical issues. Please note that NO REFILLS for any discharge medications will be authorized once you are discharged, as it is imperative that you return to your primary care physician (or establish a relationship with a primary care physician if you do not have one) for your aftercare needs so that they can reassess your need for medications and monitor your lab values.   Allergies as of 02/11/2018      Reactions   Ace Inhibitors Itching, Cough      Medication List    TAKE these medications   allopurinol 300 MG tablet Commonly known as:  ZYLOPRIM Take 150 mg by mouth every other day.   amiodarone 200 MG tablet Commonly known as:  PACERONE Take 0.5 tablets (100 mg total) by mouth daily.   AURYXIA 1 GM 210 MG(Fe) tablet Generic drug:  ferric citrate Take 210 mg by mouth 3 (three) times daily with meals.   calcium acetate 667 MG capsule Commonly known as:  PHOSLO Take 667-2,001 mg by mouth See admin instructions. Take 3 capsules with meals and 1 capsule  with snacks.   cinacalcet 30 MG tablet Commonly known as:  SENSIPAR Take 3 tablets (90 mg total) by mouth daily with supper.   dextromethorphan-guaiFENesin 30-600 MG 12hr tablet Commonly known as:  MUCINEX DM Take 1 tablet by mouth 2 (two) times daily as needed for cough.   feeding supplement (NEPRO CARB STEADY) Liqd Take 237 mLs by mouth 3 (three) times daily between meals.   feeding supplement (PRO-STAT SUGAR FREE 64) Liqd Take 30 mLs by mouth 2 (two) times daily.   lidocaine-prilocaine cream Commonly known as:  EMLA Apply 1 application topically See admin instructions. Apply topically to port access prior to dialysis - Monday, Wednesday, Friday   midodrine 10 MG tablet Commonly known as:  PROAMATINE Take 0.5-1 tablets (5-10 mg total) by mouth See admin instructions. Take one tablet (10 mg) by mouth before dialysis (Monday, Wednesday, Friday), take 1/2 tablet (5 mg) on non-dialysis days (Sunday, Tuesday, Thursday, Saturday) as needed for diastolic blood pressure <50.   mirtazapine 7.5 MG tablet Commonly known as:  REMERON Take 1 tablet (7.5 mg total) by mouth at bedtime.   multivitamin Tabs tablet Take 1 tablet by mouth at bedtime.   nitroGLYCERIN 0.4 MG SL tablet Commonly known as:  NITROSTAT Place 0.4 mg under the tongue every 5 (five) minutes as needed for chest pain.   pantoprazole  40 MG tablet Commonly known as:  PROTONIX Take 40 mg by mouth 2 (two) times daily.   sucroferric oxyhydroxide 500 MG chewable tablet Commonly known as:  VELPHORO Chew 3 tablets (1,500 mg total) by mouth 3 (three) times daily with meals.   warfarin 2.5 MG tablet Commonly known as:  COUMADIN Take 5 tablets (12.5 mg total) by mouth daily at 6 PM.            Durable Medical Equipment  (From admission, onward)         Start     Ordered   02/11/18 0750  For home use only DME Walker rolling  Once    Question:  Patient needs a walker to treat with the following condition  Answer:   Debility   02/11/18 0749         Allergies  Allergen Reactions  . Ace Inhibitors Itching and Cough      The results of significant diagnostics from this hospitalization (including imaging, microbiology, ancillary and laboratory) are listed below for reference.    Significant Diagnostic Studies: Dg Chest 2 View  Result Date: 01/26/2018 CLINICAL DATA:  Shortness of breath for 1 day EXAM: CHEST - 2 VIEW COMPARISON:  January 14, 2018 FINDINGS: The heart size is enlarged. Mediastinal contour is normal. There is patchy consolidation of left lung base. There is mild deep diffuse increased pulmonary interstitium bilaterally. There is no pulmonary edema. IMPRESSION: Patchy consolidation of left lung base suspicious for pneumonia. Mild interstitial edema. Electronically Signed   By: Abelardo Diesel M.D.   On: 01/26/2018 18:38   Ct Head Wo Contrast  Result Date: 02/01/2018 CLINICAL DATA:  57 y/o M; altered mental status. History of stroke, hypertension, diabetes. EXAM: CT HEAD WITHOUT CONTRAST TECHNIQUE: Contiguous axial images were obtained from the base of the skull through the vertex without intravenous contrast. COMPARISON:  03/16/2017 CT head and MRI head. FINDINGS: Brain: No evidence of acute infarction, hemorrhage, hydrocephalus, extra-axial collection or mass lesion/mass effect. Stable mild chronic microvascular ischemic changes and volume loss of the brain. Stable small chronic infarct within right hemi pons and right thalamus. Vascular: Calcific atherosclerosis of carotid siphons. No hyperdense vessel identified. Skull: Normal. Negative for fracture or focal lesion. Sinuses/Orbits: No acute finding. Other: None. IMPRESSION: 1. No acute intracranial abnormality identified. 2. Stable chronic microvascular ischemic changes, volume loss of the brain, as well as small chronic infarcts within the right thalamus and pons. Electronically Signed   By: Kristine Garbe M.D.   On: 02/01/2018 22:15    Ct Angio Chest Pe W/cm &/or Wo Cm  Result Date: 01/26/2018 CLINICAL DATA:  57 year old male with acute chest pain and shortness of breath. History of recent LEFT renal hemorrhage. EXAM: CT ANGIOGRAPHY CHEST WITH CONTRAST TECHNIQUE: Multidetector CT imaging of the chest was performed using the standard protocol during bolus administration of intravenous contrast. Multiplanar CT image reconstructions and MIPs were obtained to evaluate the vascular anatomy. CONTRAST:  127mL ISOVUE-370 IOPAMIDOL (ISOVUE-370) INJECTION 76% COMPARISON:  03/15/2016 chest CT FINDINGS: Cardiovascular: This is a technically suboptimal study due to extensive respiratory motion artifact and less than optimal contrast opacification of the pulmonary arteries. The study was to be repeated but patient had been sent to his hospital room where he became unstable. Probable filling defects/pulmonary emboli within 2 RIGHT UPPER lobe segments. The LOWER lobe pulmonary arteries are not well evaluated due to extensive motion artifact. Cardiomegaly noted without CT evidence of RIGHT heart strain. Coronary artery and aortic atherosclerotic calcifications  identified without aortic aneurysm. No pericardial effusion. Mediastinum/Nodes: No enlarged mediastinal, hilar, or axillary lymph nodes. Thyroid gland, trachea, and esophagus demonstrate no significant findings. Lungs/Pleura: Mild to moderate bibasilar atelectasis noted. No pleural effusion or pneumothorax. Upper Abdomen: LEFT renal/perinephric hemorrhage again noted. Musculoskeletal: No acute or suspicious bony abnormalities. Review of the MIP images confirms the above findings. IMPRESSION: 1. Suboptimal study but probable RIGHT UPPER lobe pulmonary emboli. LOWER lung pulmonary arteries are not well evaluated due to motion. Bibasilar atelectasis 2. Cardiomegaly without CT evidence of RIGHT heart strain 3. Coronary artery disease and Aortic Atherosclerosis (ICD10-I70.0). Critical Value/emergent results  were called by telephone at the time of interpretation on 01/26/2018 at 10:57 pm to [facility], RN for this patient, who verbally acknowledged these results. Electronically Signed   By: Margarette Canada M.D.   On: 01/26/2018 22:59   Ct Abdomen Pelvis W Contrast  Result Date: 01/26/2018 CLINICAL DATA:  57 year old male with continued RIGHT flank and abdominal/pelvic pain. History of recent embolization of active LEFT renal hemorrhage. CTA chest will be dictated in a separate report as repeat imaging will be performed. EXAM: CT ABDOMEN AND PELVIS WITH CONTRAST TECHNIQUE: Multidetector CT imaging of the abdomen and pelvis was performed using the standard protocol following bolus administration of intravenous contrast. CONTRAST:  18mL ISOVUE-370 IOPAMIDOL (ISOVUE-370) INJECTION 76% COMPARISON:  01/13/2018 CT FINDINGS: Lower chest: Bibasilar atelectasis noted. Hepatobiliary: The liver is unremarkable except for a small LEFT hepatic cyst. The patient is status post cholecystectomy. No biliary dilatation. Pancreas: Unremarkable Spleen: Unremarkable Adrenals/Urinary Tract: Moderate to large LEFT renal and perinephric hematoma/hemorrhage extending into the pelvis is not significantly changed in appearance. Embolization coils in the region of the LEFT renal artery noted. The patient is status post RIGHT nephrectomy. The adrenal glands are unremarkable.  The bladder is collapsed. Stomach/Bowel: Stomach is within normal limits. Appendix appears normal. No evidence of bowel wall thickening, distention, or inflammatory changes. Vascular/Lymphatic: Aortic atherosclerosis. No enlarged abdominal or pelvic lymph nodes. Reproductive: Prostate is unremarkable. Other: No evidence of abscess, pneumoperitoneum or ascites. Musculoskeletal: No acute or suspicious bony abnormalities. IMPRESSION: 1. Unchanged appearance of moderate to large LEFT renal and perinephric hematoma/hemorrhage descending into the pelvis. 2. Bibasilar atelectasis 3.  Aortic  Atherosclerosis (ICD10-I70.0). CTA of the chest will be dictated in a separate report. Electronically Signed   By: Margarette Canada M.D.   On: 01/26/2018 22:06   Ct Abdomen Pelvis W Contrast  Result Date: 01/13/2018 CLINICAL DATA:  Abdominal pain, diverticulitis suspected. Dialysis patient with episode of hematuria today, left abdominal pain. EXAM: CT ABDOMEN AND PELVIS WITH CONTRAST TECHNIQUE: Multidetector CT imaging of the abdomen and pelvis was performed using the standard protocol following bolus administration of intravenous contrast. CONTRAST:  143mL ISOVUE-300 IOPAMIDOL (ISOVUE-300) INJECTION 61% COMPARISON:  CT 02/22/2017 FINDINGS: Lower chest: Multi chamber cardiomegaly. Ground-glass and consolidative opacity in the dependent left lower lobe. No pleural fluid. Hepatobiliary: Small cyst in the left hepatic lobe, unchanged. No new hepatic lesion. Postcholecystectomy with small cystic duct remnant. No biliary dilatation. Pancreas: No ductal dilatation or inflammation. Spleen: Normal in size without focal abnormality. Splenule inferiorly. Adrenals/Urinary Tract: Normal adrenal glands. Post right nephrectomy without suspicious soft tissue density in the nephrectomy bed. There is an ovoid heterogeneous region in the upper left kidney measuring 3.5 x 4.5 cm that is new from prior exam, containing a punctate internal hyperdensity, axial image 26 series 3. Adjacent perinephric edema. Mild left hydroureteronephrosis containing high-density material. Otherwise left renal parenchymal atrophy and cystic changes of  chronic renal disease. Urinary bladder completely decompressed. Stomach/Bowel: Colonic diverticulosis without diverticulitis. Small colonic stool burden. Normal appendix. No small bowel dilatation, obstruction or inflammation. Stomach is nondistended. Vascular/Lymphatic: Aortic and branch atherosclerosis. No aneurysm. Multiple small retroperitoneal and upper abdominal lymph nodes not enlarged by size criteria.  No bulky adenopathy. Reproductive: Prostate is unremarkable. Other: Fat containing umbilical hernia.  No ascites.  No free air. Musculoskeletal: Degenerative disc disease L4-L5. There are no acute or suspicious osseous abnormalities. IMPRESSION: 1. New 3.5 x 4.5 cm ovoid heterogeneous hyperdensity in the upper left kidney with surrounding stranding and small central focus of high-density material, concerning for acute bleed. Findings may be hematoma due to elevated INR versus underlying lesion such as renal cell carcinoma with hemorrhage. Additionally there is mild left hydroureteronephrosis with high-density material throughout the renal collecting system and ureter consistent with blood. 2. Colonic diverticulosis without diverticulitis. 3. Patchy opacity in the left lung base likely reactive atelectasis. 4.  Aortic Atherosclerosis (ICD10-I70.0). These results were called by telephone at the time of interpretation on 01/13/2018 at 12:44 am to Dr. Addison Lank , who verbally acknowledged these results. Electronically Signed   By: Jeb Levering M.D.   On: 01/13/2018 00:44   Ir Angiogram Renal Left Selective  Result Date: 01/13/2018 INDICATION: Hypovolemic shock.  Left perinephric hematoma. EXAM: IR RIGHT FLOURO GUIDE CV LINE; IR EMBO ART VEN HEMORR LYMPH EXTRAV INC GUIDE ROADMAPPING; IR RENAL SELECTIVE UNI INC S+I MODERATE SEDATION; IR ULTRASOUND GUIDANCE VASC ACCESS RIGHT MEDICATIONS: None. The antibiotic was administered within 1 hour of the procedure ANESTHESIA/SEDATION: Moderate (conscious) sedation was employed during this procedure. A total of Versed 0.5 mg and Fentanyl 0 mcg was administered intravenously. Moderate Sedation Time: 57 minutes. The patient's level of consciousness and vital signs were monitored continuously by radiology nursing throughout the procedure under my direct supervision. CONTRAST:  71mL OMNIPAQUE IOHEXOL 300 MG/ML  SOLN FLUOROSCOPY TIME:  Fluoroscopy Time: 12 minutes 30 seconds  (620 mGy). COMPLICATIONS: None immediate. PROCEDURE: Informed consent was obtained from the patient following explanation of the procedure, risks, benefits and alternatives. The patient understands, agrees and consents for the procedure. All questions were addressed. A time out was performed prior to the initiation of the procedure. Maximal barrier sterile technique utilized including caps, mask, sterile gowns, sterile gloves, large sterile drape, hand hygiene, and Betadine prep. The right groin was prepped and draped in a sterile fashion. 1% lidocaine was utilized for local anesthesia. Under sonographic guidance, a micropuncture needle was inserted into the right common femoral artery. Sonographic documentation was obtained and the common femoral artery was noted to be patent. A 1 8 wire was up sized to a Bentson. Five French sheath was inserted. A 5 French pigtail catheter was advanced into the abdominal aorta and AP arteriography was performed. The pigtail was exchanged for a five [place] sauce Omni catheter was advanced over the wire into the thoracic aorta. The reverse curve was formed. The catheter was retracted. It was advanced over a Bentson wire into the left renal artery. Angiography was performed. A Renegade microcatheter was advanced through the sauce Omni catheter and over a 016 fathom wire into the peripheral branches of the left renal artery towards the upper pole. Contrast was injected confirming position in in the peripheral left renal arterial tree. Coil embolization was performed. Coiled utilized were 6 mm x 10 cm interlock, 6 mm x 20 cm interlock, 6 mm x 20 cm interlock. Repeat angiography through the microcatheter was performed. This demonstrated  some residual flow through the coil pack. Gel-Foam slurry was then injected through the microcatheter until stasis of flow was achieved. Microcatheter was removed. The soft was retracted into the right common iliac artery and right femoral angiography  was performed. The catheter was removed. The sheath was flushed and sewn in place. Under sonographic guidance, a micropuncture needle was inserted into the left common femoral vein and removed over a 018 wire which was up sized to a 3 J. Sonographic documentation was obtained. The right common femoral vein was noted to be patent. A 7 French triple-lumen catheter was advanced over the 035 3 J wire. It was flushed and sewn in place. FINDINGS: Aortic arteriography demonstrates left renal artery anatomy. There is significant narrowing just beyond its takeoff. The right renal artery is not clearly visualized and most likely has been ligated. Selective arteriography of the left renal artery delineates anatomy and demonstrates active extravasation involving the upper pole of the left kidney in multiple locations. Subsequent imaging demonstrates selective injection of third order branches of the left renal artery. Serial imaging demonstrates deposition of coils within the left upper pole renal artery branches extending into the main left renal artery. Final arteriography demonstrates occlusion of the left renal artery. Femoral arteriography delineates right femoral artery anatomy and sheath insertion. Final imaging demonstrates a right common femoral vein triple-lumen central venous catheter with its tip in the right common iliac vein. IMPRESSION: Successful coil embolization of the main left renal artery with stasis of flow and resolution of active extravasation of bleeding from the upper pole of the left kidney. Successful right common femoral vein central venous triple-lumen catheter. Electronically Signed   By: Marybelle Killings M.D.   On: 01/13/2018 13:50   Ir Ivc Filter Plmt / S&i /img Guid/mod Sed  Result Date: 01/27/2018 INDICATION: Pulmonary thromboembolism.  Hemorrhagic shock. EXAM: IVC FILTER,INFERIOR VENA CAVOGRAM MEDICATIONS: None. ANESTHESIA/SEDATION: Fentanyl 50 mcg IV; Versed 1 mg IV Moderate Sedation Time:   16 minutes The patient was continuously monitored during the procedure by the interventional radiology nurse under my direct supervision. FLUOROSCOPY TIME:  Fluoroscopy Time: 1 minutes  seconds (131 mGy). COMPLICATIONS: None immediate. PROCEDURE: Informed written consent was obtained from the patient after a thorough discussion of the procedural risks, benefits and alternatives. All questions were addressed. Maximal Sterile Barrier Technique was utilized including caps, mask, sterile gowns, sterile gloves, sterile drape, hand hygiene and skin antiseptic. A timeout was performed prior to the initiation of the procedure. The right neck was prepped with Betadine in a sterile fashion, and a sterile drape was applied covering the operative field. A sterile gown and sterile gloves were used for the procedure. The right jugular vein was noted to be patent initially with ultrasound. Under sonographic guidance, a micropuncture needle was inserted into the right jugular vein (Ultrasound image documentation was performed). It was removed over an 018 wire which was up-sized to a [place]. The sheath was inserted over the wire and into the IVC. IVC venography was performed. The temporary filter was then deployed in the infrarenal IVC. The sheath was removed and hemostasis was achieved with direct pressure. FINDINGS: Venography demonstrates approximate renal vein inflow at L1. The patient is status post right nephrectomy and left renal embolization. There is no evidence of DVT or venous anomaly. The final image demonstrates an IVC filter with its tip positioned at the L1-2 disc level. IMPRESSION: Successful infrarenal IVC filter placement. This is a temporary filter. It can be removed or remain in  place to become permanent. PLAN: This IVC filter is potentially retrievable. The patient will be assessed for filter retrieval by Interventional Radiology in approximately 8-12 weeks. Further recommendations regarding filter retrieval,  continued surveillance or declaration of device permanence, will be made at that time. Electronically Signed   By: Marybelle Killings M.D.   On: 01/27/2018 16:31   Ir Fluoro Guide Cv Line Right  Result Date: 01/13/2018 INDICATION: Hypovolemic shock.  Left perinephric hematoma. EXAM: IR RIGHT FLOURO GUIDE CV LINE; IR EMBO ART VEN HEMORR LYMPH EXTRAV INC GUIDE ROADMAPPING; IR RENAL SELECTIVE UNI INC S+I MODERATE SEDATION; IR ULTRASOUND GUIDANCE VASC ACCESS RIGHT MEDICATIONS: None. The antibiotic was administered within 1 hour of the procedure ANESTHESIA/SEDATION: Moderate (conscious) sedation was employed during this procedure. A total of Versed 0.5 mg and Fentanyl 0 mcg was administered intravenously. Moderate Sedation Time: 57 minutes. The patient's level of consciousness and vital signs were monitored continuously by radiology nursing throughout the procedure under my direct supervision. CONTRAST:  83mL OMNIPAQUE IOHEXOL 300 MG/ML  SOLN FLUOROSCOPY TIME:  Fluoroscopy Time: 12 minutes 30 seconds (620 mGy). COMPLICATIONS: None immediate. PROCEDURE: Informed consent was obtained from the patient following explanation of the procedure, risks, benefits and alternatives. The patient understands, agrees and consents for the procedure. All questions were addressed. A time out was performed prior to the initiation of the procedure. Maximal barrier sterile technique utilized including caps, mask, sterile gowns, sterile gloves, large sterile drape, hand hygiene, and Betadine prep. The right groin was prepped and draped in a sterile fashion. 1% lidocaine was utilized for local anesthesia. Under sonographic guidance, a micropuncture needle was inserted into the right common femoral artery. Sonographic documentation was obtained and the common femoral artery was noted to be patent. A 1 8 wire was up sized to a Bentson. Five French sheath was inserted. A 5 French pigtail catheter was advanced into the abdominal aorta and AP  arteriography was performed. The pigtail was exchanged for a five [place] sauce Omni catheter was advanced over the wire into the thoracic aorta. The reverse curve was formed. The catheter was retracted. It was advanced over a Bentson wire into the left renal artery. Angiography was performed. A Renegade microcatheter was advanced through the sauce Omni catheter and over a 016 fathom wire into the peripheral branches of the left renal artery towards the upper pole. Contrast was injected confirming position in in the peripheral left renal arterial tree. Coil embolization was performed. Coiled utilized were 6 mm x 10 cm interlock, 6 mm x 20 cm interlock, 6 mm x 20 cm interlock. Repeat angiography through the microcatheter was performed. This demonstrated some residual flow through the coil pack. Gel-Foam slurry was then injected through the microcatheter until stasis of flow was achieved. Microcatheter was removed. The soft was retracted into the right common iliac artery and right femoral angiography was performed. The catheter was removed. The sheath was flushed and sewn in place. Under sonographic guidance, a micropuncture needle was inserted into the left common femoral vein and removed over a 018 wire which was up sized to a 3 J. Sonographic documentation was obtained. The right common femoral vein was noted to be patent. A 7 French triple-lumen catheter was advanced over the 035 3 J wire. It was flushed and sewn in place. FINDINGS: Aortic arteriography demonstrates left renal artery anatomy. There is significant narrowing just beyond its takeoff. The right renal artery is not clearly visualized and most likely has been ligated. Selective arteriography of the  left renal artery delineates anatomy and demonstrates active extravasation involving the upper pole of the left kidney in multiple locations. Subsequent imaging demonstrates selective injection of third order branches of the left renal artery. Serial imaging  demonstrates deposition of coils within the left upper pole renal artery branches extending into the main left renal artery. Final arteriography demonstrates occlusion of the left renal artery. Femoral arteriography delineates right femoral artery anatomy and sheath insertion. Final imaging demonstrates a right common femoral vein triple-lumen central venous catheter with its tip in the right common iliac vein. IMPRESSION: Successful coil embolization of the main left renal artery with stasis of flow and resolution of active extravasation of bleeding from the upper pole of the left kidney. Successful right common femoral vein central venous triple-lumen catheter. Electronically Signed   By: Marybelle Killings M.D.   On: 01/13/2018 13:50   Ir US Guide Vasc Access Right  Result Date: 01/13/2018 INDICATION: Hypovolemic shock.  Left perinephric hematoma. EXAM: IR RIGHT FLOURO GUIDE CV LINE; IR EMBO ART VEN HEMORR LYMPH EXTRAV INC GUIDE ROADMAPPING; IR RENAL SELECTIVE UNI INC S+I MODERATE SEDATION; IR ULTRASOUND GUIDANCE VASC ACCESS RIGHT MEDICATIONS: None. The antibiotic was administered within 1 hour of the procedure ANESTHESIA/SEDATION: Moderate (conscious) sedation was employed during this procedure. A total of Versed 0.5 mg and Fentanyl 0 mcg was administered intravenously. Moderate Sedation Time: 57 minutes. The patient's level of consciousness and vital signs were monitored continuously by radiology nursing throughout the procedure under my direct supervision. CONTRAST:  63mL OMNIPAQUE IOHEXOL 300 MG/ML  SOLN FLUOROSCOPY TIME:  Fluoroscopy Time: 12 minutes 30 seconds (620 mGy). COMPLICATIONS: None immediate. PROCEDURE: Informed consent was obtained from the patient following explanation of the procedure, risks, benefits and alternatives. The patient understands, agrees and consents for the procedure. All questions were addressed. A time out was performed prior to the initiation of the procedure. Maximal barrier  sterile technique utilized including caps, mask, sterile gowns, sterile gloves, large sterile drape, hand hygiene, and Betadine prep. The right groin was prepped and draped in a sterile fashion. 1% lidocaine was utilized for local anesthesia. Under sonographic guidance, a micropuncture needle was inserted into the right common femoral artery. Sonographic documentation was obtained and the common femoral artery was noted to be patent. A 1 8 wire was up sized to a Bentson. Five French sheath was inserted. A 5 French pigtail catheter was advanced into the abdominal aorta and AP arteriography was performed. The pigtail was exchanged for a five [place] sauce Omni catheter was advanced over the wire into the thoracic aorta. The reverse curve was formed. The catheter was retracted. It was advanced over a Bentson wire into the left renal artery. Angiography was performed. A Renegade microcatheter was advanced through the sauce Omni catheter and over a 016 fathom wire into the peripheral branches of the left renal artery towards the upper pole. Contrast was injected confirming position in in the peripheral left renal arterial tree. Coil embolization was performed. Coiled utilized were 6 mm x 10 cm interlock, 6 mm x 20 cm interlock, 6 mm x 20 cm interlock. Repeat angiography through the microcatheter was performed. This demonstrated some residual flow through the coil pack. Gel-Foam slurry was then injected through the microcatheter until stasis of flow was achieved. Microcatheter was removed. The soft was retracted into the right common iliac artery and right femoral angiography was performed. The catheter was removed. The sheath was flushed and sewn in place. Under sonographic guidance, a micropuncture needle was  inserted into the left common femoral vein and removed over a 018 wire which was up sized to a 3 J. Sonographic documentation was obtained. The right common femoral vein was noted to be patent. A 7 French  triple-lumen catheter was advanced over the 035 3 J wire. It was flushed and sewn in place. FINDINGS: Aortic arteriography demonstrates left renal artery anatomy. There is significant narrowing just beyond its takeoff. The right renal artery is not clearly visualized and most likely has been ligated. Selective arteriography of the left renal artery delineates anatomy and demonstrates active extravasation involving the upper pole of the left kidney in multiple locations. Subsequent imaging demonstrates selective injection of third order branches of the left renal artery. Serial imaging demonstrates deposition of coils within the left upper pole renal artery branches extending into the main left renal artery. Final arteriography demonstrates occlusion of the left renal artery. Femoral arteriography delineates right femoral artery anatomy and sheath insertion. Final imaging demonstrates a right common femoral vein triple-lumen central venous catheter with its tip in the right common iliac vein. IMPRESSION: Successful coil embolization of the main left renal artery with stasis of flow and resolution of active extravasation of bleeding from the upper pole of the left kidney. Successful right common femoral vein central venous triple-lumen catheter. Electronically Signed   By: Marybelle Killings M.D.   On: 01/13/2018 13:50   Dg Chest Port 1 View  Result Date: 01/27/2018 CLINICAL DATA:  Short of breath EXAM: PORTABLE CHEST 1 VIEW COMPARISON:  CT chest 01/26/2018, radiograph 01/26/2018, 01/14/2018 FINDINGS: Marked cardiomegaly. Elevated left diaphragm with patchy airspace disease at the left base. No pneumothorax. IMPRESSION: 1. Marked cardiomegaly. 2. Elevated left diaphragm with patchy atelectasis or infiltrate at the left base. Electronically Signed   By: Donavan Foil M.D.   On: 01/27/2018 22:01   Dg Chest Port 1 View  Result Date: 01/14/2018 CLINICAL DATA:  Congestive heart failure EXAM: PORTABLE CHEST 1 VIEW  COMPARISON:  07/17/2017 FINDINGS: Chronic cardiomegaly. Abnormal density in the left lower chest probably secondary to a combination of pleural fluid and left lower lobe density. This could be simple atelectasis or pneumonia. The right chest is clear. No sign of pulmonary edema. IMPRESSION: Cardiomegaly. Left effusion. Abnormal left base pulmonary density which could be atelectasis and/or pneumonia. Electronically Signed   By: Nelson Chimes M.D.   On: 01/14/2018 13:00   Ct Renal Stone Study  Result Date: 01/13/2018 CLINICAL DATA:  Left renal hemorrhage with increased pain EXAM: CT ABDOMEN AND PELVIS WITHOUT CONTRAST TECHNIQUE: Multidetector CT imaging of the abdomen and pelvis was performed following the standard protocol without oral or IV contrast. COMPARISON:  January 13, 2018 study obtained earlier in the day FINDINGS: Lower chest: There is bibasilar atelectatic change. There are multiple foci coronary artery calcification. There is a small left pleural effusion. Hepatobiliary: There is an 8 mm probable cyst in the left lobe of the liver. No other focal liver lesions are evident. Gallbladder is absent. There is no biliary duct dilatation. Pancreas: No pancreatic mass or inflammatory focus. The pancreas is displaced anteriorly due to large amount of hemorrhage within the left perinephric fascia. Spleen: No splenic lesions are identified. Spleen is displaced laterally due to extensive left perirenal hemorrhage. Adrenals/Urinary Tract: Adrenals appear unremarkable. There has been extensive progression of hemorrhage throughout the left kidney and perirenal region. The left kidney is edematous with extensive hemorrhage throughout the left kidney distorting renal architecture. There is extensive soft tissue stranding and increased  attenuation fluid throughout the perinephric fascia causing expansion of the perinephric fascia and displacement of adjacent structures. There is mass effect on the adjacent left psoas  muscle medially as well as displacement the pancreas anteriorly in the spleen laterally. Hemorrhage tracks throughout the renal parenchyma diffusely, best appreciated on sagittal and coronal images. There are several cystic areas within the left kidney as well as the site of original hematoma in the upper pole region, less well-defined than earlier in the day. The right kidney is absent with surgical clips in the right pararenal region. No mass lesions seen in the right retroperitoneum. Urinary bladder is midline and collapsed. No calculus is seen in the left kidney or ureter. Stomach/Bowel: There is no appreciable bowel wall or mesenteric thickening. Loops of bowel are displaced medially due to the large pararenal hemorrhage on the left. No bowel obstruction. No free air or portal venous air evident. Vascular/Lymphatic: There is aortoiliac atherosclerosis. No aneurysm evident. No adenopathy appreciable. Reproductive: Prostate and seminal vesicles are normal in size and contour. Other: Appendix appears normal. There is a ventral hernia containing only fat, stable. There is fat in each inguinal ring. Note that there is hemorrhage tracking from the pararenal region along the left retroperitoneum into a small inguinal hernia on the left. No abscess evident in the abdomen pelvis. No ascites. Musculoskeletal: There is degenerative change in the lumbar spine with vacuum phenomenon at L4-5. No blastic or lytic bone lesions. No intramuscular lesions are evident. IMPRESSION: 1. Extensive left perinephric hemorrhage with distortion of the renal parenchyma due to hemorrhage. Hemorrhage extends throughout the pararenal fascia with extension of the pararenal fascia and displacement of adjacent structures as noted. Fluid tracks from the left pararenal fascia inferiorly along the left pelvis to extend into a left inguinal hernia which earlier in the day contained only fat. 2.  Right kidney absent.  No lesions seen in right  pararenal fossa. 3. No bowel obstruction. No abscess in the abdomen or pelvis. Appendix appears unremarkable. 4.  There is aortoiliac atherosclerosis. 5.  Small ventral hernia containing only fat. 6.  Small left pleural effusion. Aortic Atherosclerosis (ICD10-I70.0). Critical Value/emergent results were called by telephone at the time of interpretation on 01/13/2018 at 7:52 am to emergency department physician covering for Dr. Alcario Drought, who verbally acknowledged these results. Electronically Signed   By: Lowella Grip III M.D.   On: 01/13/2018 07:52   Ir Embo Art  [place] Guide Roadmapping  Result Date: 01/13/2018 INDICATION: Hypovolemic shock.  Left perinephric hematoma. EXAM: IR RIGHT FLOURO GUIDE CV LINE; IR EMBO ART VEN HEMORR LYMPH EXTRAV INC GUIDE ROADMAPPING; IR RENAL SELECTIVE UNI INC S+I MODERATE SEDATION; IR ULTRASOUND GUIDANCE VASC ACCESS RIGHT MEDICATIONS: None. The antibiotic was administered within 1 hour of the procedure ANESTHESIA/SEDATION: Moderate (conscious) sedation was employed during this procedure. A total of Versed 0.5 mg and Fentanyl 0 mcg was administered intravenously. Moderate Sedation Time: 57 minutes. The patient's level of consciousness and vital signs were monitored continuously by radiology nursing throughout the procedure under my direct supervision. CONTRAST:  38mL OMNIPAQUE IOHEXOL 300 MG/ML  SOLN FLUOROSCOPY TIME:  Fluoroscopy Time: 12 minutes 30 seconds (620 mGy). COMPLICATIONS: None immediate. PROCEDURE: Informed consent was obtained from the patient following explanation of the procedure, risks, benefits and alternatives. The patient understands, agrees and consents for the procedure. All questions were addressed. A time out was performed prior to the initiation of the procedure. Maximal barrier sterile technique utilized including caps, mask,  sterile gowns, sterile gloves, large sterile drape, hand hygiene, and Betadine prep. The right groin was  prepped and draped in a sterile fashion. 1% lidocaine was utilized for local anesthesia. Under sonographic guidance, a micropuncture needle was inserted into the right common femoral artery. Sonographic documentation was obtained and the common femoral artery was noted to be patent. A 1 8 wire was up sized to a Bentson. Five French sheath was inserted. A 5 French pigtail catheter was advanced into the abdominal aorta and AP arteriography was performed. The pigtail was exchanged for a five [place] sauce Omni catheter was advanced over the wire into the thoracic aorta. The reverse curve was formed. The catheter was retracted. It was advanced over a Bentson wire into the left renal artery. Angiography was performed. A Renegade microcatheter was advanced through the sauce Omni catheter and over a 016 fathom wire into the peripheral branches of the left renal artery towards the upper pole. Contrast was injected confirming position in in the peripheral left renal arterial tree. Coil embolization was performed. Coiled utilized were 6 mm x 10 cm interlock, 6 mm x 20 cm interlock, 6 mm x 20 cm interlock. Repeat angiography through the microcatheter was performed. This demonstrated some residual flow through the coil pack. Gel-Foam slurry was then injected through the microcatheter until stasis of flow was achieved. Microcatheter was removed. The soft was retracted into the right common iliac artery and right femoral angiography was performed. The catheter was removed. The sheath was flushed and sewn in place. Under sonographic guidance, a micropuncture needle was inserted into the left common femoral vein and removed over a 018 wire which was up sized to a 3 J. Sonographic documentation was obtained. The right common femoral vein was noted to be patent. A 7 French triple-lumen catheter was advanced over the 035 3 J wire. It was flushed and sewn in place. FINDINGS: Aortic arteriography demonstrates left renal artery anatomy.  There is significant narrowing just beyond its takeoff. The right renal artery is not clearly visualized and most likely has been ligated. Selective arteriography of the left renal artery delineates anatomy and demonstrates active extravasation involving the upper pole of the left kidney in multiple locations. Subsequent imaging demonstrates selective injection of third order branches of the left renal artery. Serial imaging demonstrates deposition of coils within the left upper pole renal artery branches extending into the main left renal artery. Final arteriography demonstrates occlusion of the left renal artery. Femoral arteriography delineates right femoral artery anatomy and sheath insertion. Final imaging demonstrates a right common femoral vein triple-lumen central venous catheter with its tip in the right common iliac vein. IMPRESSION: Successful coil embolization of the main left renal artery with stasis of flow and resolution of active extravasation of bleeding from the upper pole of the left kidney. Successful right common femoral vein central venous triple-lumen catheter. Electronically Signed   By: Marybelle Killings M.D.   On: 01/13/2018 13:50    Microbiology: No results found for this or any previous visit (from the past 240 hour(s)).   Labs: Basic Metabolic Panel: Recent Labs  Lab 02/05/18 0637 02/06/18 0842 02/08/18 0810 02/10/18 0952  NA 133* 133* 131* 132*  K 4.1 4.3 3.9 3.8  CL 94* 94* 90* 92*  CO2 23 24 26 26   GLUCOSE 76 77 75 73  BUN 55* 71* 50* 35*  CREATININE 10.16* 12.64* 11.38* 9.62*  CALCIUM 11.3* 11.7* 11.1* 10.6*  PHOS  --  4.8* 4.6 4.4  Liver Function Tests: Recent Labs  Lab 02/06/18 0842 02/08/18 0810 02/10/18 0952  ALBUMIN 2.5* 2.6* 2.6*   No results for input(s): LIPASE, AMYLASE in the last 168 hours. No results for input(s): AMMONIA in the last 168 hours. CBC: Recent Labs  Lab 02/07/18 0513 02/08/18 0515 02/09/18 0631 02/10/18 0832 02/11/18 0558    WBC 10.0 9.3 8.3 8.3 8.6  HGB 8.6* 9.0* 8.8* 9.0* 9.3*  HCT 27.7* 28.7* 28.6* 29.0* 30.7*  MCV 98.2 97.3 99.3 100.0 101.0*  PLT 249 281 237 269 242   Cardiac Enzymes: No results for input(s): CKTOTAL, CKMB, CKMBINDEX, TROPONINI in the last 168 hours. BNP: BNP (last 3 results) Recent Labs    07/18/17 0531  BNP 1,325.2*    ProBNP (last 3 results) No results for input(s): PROBNP in the last 8760 hours.  CBG: No results for input(s): GLUCAP in the last 168 hours.     Signed:  Desiree Hane, MD [facility] 02/11/2018, 1:08 PM

## 2018-02-11 NOTE — Progress Notes (Signed)
Physical Therapy Treatment Patient Details Name: [name] MRN: [id number] DOB: 03-Feb-1961 Today's Date: 02/11/2018    History of Present Illness Pt is a 57 year old male with a history of ESRD on hemodialysis Monday Wednesday Friday, hypertension, CVA, GERD, gout, atrial fibrillation not on anticoagulation, systolic CHF with EF 77%, CAD, and status post left nephrectomy for spontaneous and life threatening hemorrhage in the left kidney in the setting of supratherapeutic INR who came to hospital with worsening shortness of breath; Found PE, IVC filter placed    PT Comments    Pt making steady progress with functional mobility. Ambulated in hallway without use of RW this session. Would still benefit from a RW upon d/c for pt to have on dialysis days or when he is feeling less steady. PT will continue to follow acutely to progress mobility as tolerated.     Follow Up Recommendations  No PT follow up     Equipment Recommendations  Rolling walker with 5" wheels    Recommendations for Other Services       Precautions / Restrictions Precautions Precaution Comments: slight fall risk Restrictions Weight Bearing Restrictions: No    Mobility  Bed Mobility Overal bed mobility: Modified Independent                Transfers Overall transfer level: Modified independent Equipment used: None                Ambulation/Gait Ambulation/Gait assistance: Supervision Gait Distance (Feet): 200 Feet Assistive device: None Gait Pattern/deviations: Decreased stride length;Step-through pattern   Gait velocity interpretation: 1.31 - 2.62 ft/sec, indicative of limited community ambulator General Gait Details: pt with slightly decreased pace, appropriately cautious and able to navigate around obstacles with no difficulty. One standing rest break throughout   Stairs             Wheelchair Mobility    Modified Rankin (Stroke Patients Only)       Balance Overall balance  assessment: No apparent balance deficits (not formally assessed)                                          Cognition Arousal/Alertness: Awake/alert Behavior During Therapy: WFL for tasks assessed/performed Overall Cognitive Status: Within Functional Limits for tasks assessed                                        Exercises      General Comments        Pertinent Vitals/Pain Pain Assessment: No/denies pain    Home Living                      Prior Function            PT Goals (current goals can now be found in the care plan section) Acute Rehab PT Goals PT Goal Formulation: With patient Time For Goal Achievement: 02/21/18 Potential to Achieve Goals: Good Progress towards PT goals: Progressing toward goals    Frequency    Min 3X/week      PT Plan Current plan remains appropriate    Co-evaluation              AM-PAC PT "6 Clicks" Daily Activity  Outcome Measure  Difficulty turning over in bed (including adjusting bedclothes, sheets and  blankets)?: None Difficulty moving from lying on back to sitting on the side of the bed? : None Difficulty sitting down on and standing up from a chair with arms (e.g., wheelchair, bedside commode, etc,.)?: None Help needed moving to and from a bed to chair (including a wheelchair)?: None Help needed walking in hospital room?: None Help needed climbing 3-5 steps with a railing? : A Little 6 Click Score: 23    End of Session Equipment Utilized During Treatment: Gait belt Activity Tolerance: Patient tolerated treatment well Patient left: with call bell/phone within reach;in bed Nurse Communication: Mobility status PT Visit Diagnosis: Other abnormalities of gait and mobility (R26.89)     Time: 2505-3976 PT Time Calculation (min) (ACUTE ONLY): 10 min  Charges:  $Gait Training: 8-22 mins                     Sherie Don, [place], DPT  Acute Rehabilitation Services Pager  [phone] Office [place] 02/11/2018, 1:46 PM

## 2018-02-13 DIAGNOSIS — E1129 Type 2 diabetes mellitus with other diabetic kidney complication: Secondary | ICD-10-CM | POA: Diagnosis not present

## 2018-02-13 DIAGNOSIS — N2581 Secondary hyperparathyroidism of renal origin: Secondary | ICD-10-CM | POA: Diagnosis not present

## 2018-02-13 DIAGNOSIS — N186 End stage renal disease: Secondary | ICD-10-CM | POA: Diagnosis not present

## 2018-02-13 DIAGNOSIS — D631 Anemia in chronic kidney disease: Secondary | ICD-10-CM | POA: Diagnosis not present

## 2018-02-14 DIAGNOSIS — N186 End stage renal disease: Secondary | ICD-10-CM | POA: Diagnosis not present

## 2018-02-14 DIAGNOSIS — I2510 Atherosclerotic heart disease of native coronary artery without angina pectoris: Secondary | ICD-10-CM | POA: Diagnosis not present

## 2018-02-14 DIAGNOSIS — I120 Hypertensive chronic kidney disease with stage 5 chronic kidney disease or end stage renal disease: Secondary | ICD-10-CM | POA: Diagnosis not present

## 2018-02-14 DIAGNOSIS — D62 Acute posthemorrhagic anemia: Secondary | ICD-10-CM | POA: Diagnosis not present

## 2018-02-14 DIAGNOSIS — I5022 Chronic systolic (congestive) heart failure: Secondary | ICD-10-CM | POA: Diagnosis not present

## 2018-02-15 DIAGNOSIS — N186 End stage renal disease: Secondary | ICD-10-CM | POA: Diagnosis not present

## 2018-02-15 DIAGNOSIS — N2581 Secondary hyperparathyroidism of renal origin: Secondary | ICD-10-CM | POA: Diagnosis not present

## 2018-02-15 DIAGNOSIS — D631 Anemia in chronic kidney disease: Secondary | ICD-10-CM | POA: Diagnosis not present

## 2018-02-15 DIAGNOSIS — E1129 Type 2 diabetes mellitus with other diabetic kidney complication: Secondary | ICD-10-CM | POA: Diagnosis not present

## 2018-02-15 DIAGNOSIS — D689 Coagulation defect, unspecified: Secondary | ICD-10-CM | POA: Diagnosis not present

## 2018-02-17 DIAGNOSIS — E1129 Type 2 diabetes mellitus with other diabetic kidney complication: Secondary | ICD-10-CM | POA: Diagnosis not present

## 2018-02-17 DIAGNOSIS — D631 Anemia in chronic kidney disease: Secondary | ICD-10-CM | POA: Diagnosis not present

## 2018-02-17 DIAGNOSIS — N186 End stage renal disease: Secondary | ICD-10-CM | POA: Diagnosis not present

## 2018-02-17 DIAGNOSIS — N2581 Secondary hyperparathyroidism of renal origin: Secondary | ICD-10-CM | POA: Diagnosis not present

## 2018-02-20 DIAGNOSIS — D631 Anemia in chronic kidney disease: Secondary | ICD-10-CM | POA: Diagnosis not present

## 2018-02-20 DIAGNOSIS — E1129 Type 2 diabetes mellitus with other diabetic kidney complication: Secondary | ICD-10-CM | POA: Diagnosis not present

## 2018-02-20 DIAGNOSIS — N186 End stage renal disease: Secondary | ICD-10-CM | POA: Diagnosis not present

## 2018-02-20 DIAGNOSIS — N2581 Secondary hyperparathyroidism of renal origin: Secondary | ICD-10-CM | POA: Diagnosis not present

## 2018-02-21 DIAGNOSIS — N186 End stage renal disease: Secondary | ICD-10-CM | POA: Diagnosis not present

## 2018-02-21 DIAGNOSIS — Z992 Dependence on renal dialysis: Secondary | ICD-10-CM | POA: Diagnosis not present

## 2018-02-21 DIAGNOSIS — Z7901 Long term (current) use of anticoagulants: Secondary | ICD-10-CM | POA: Diagnosis not present

## 2018-02-21 DIAGNOSIS — I5022 Chronic systolic (congestive) heart failure: Secondary | ICD-10-CM | POA: Diagnosis not present

## 2018-02-21 DIAGNOSIS — I120 Hypertensive chronic kidney disease with stage 5 chronic kidney disease or end stage renal disease: Secondary | ICD-10-CM | POA: Diagnosis not present

## 2018-02-21 DIAGNOSIS — I2510 Atherosclerotic heart disease of native coronary artery without angina pectoris: Secondary | ICD-10-CM | POA: Diagnosis not present

## 2018-02-22 DIAGNOSIS — N2581 Secondary hyperparathyroidism of renal origin: Secondary | ICD-10-CM | POA: Diagnosis not present

## 2018-02-22 DIAGNOSIS — D631 Anemia in chronic kidney disease: Secondary | ICD-10-CM | POA: Diagnosis not present

## 2018-02-22 DIAGNOSIS — N186 End stage renal disease: Secondary | ICD-10-CM | POA: Diagnosis not present

## 2018-02-22 DIAGNOSIS — D689 Coagulation defect, unspecified: Secondary | ICD-10-CM | POA: Diagnosis not present

## 2018-02-22 DIAGNOSIS — E1129 Type 2 diabetes mellitus with other diabetic kidney complication: Secondary | ICD-10-CM | POA: Diagnosis not present

## 2018-02-22 DIAGNOSIS — D509 Iron deficiency anemia, unspecified: Secondary | ICD-10-CM | POA: Diagnosis not present

## 2018-02-24 DIAGNOSIS — N186 End stage renal disease: Secondary | ICD-10-CM | POA: Diagnosis not present

## 2018-02-24 DIAGNOSIS — D509 Iron deficiency anemia, unspecified: Secondary | ICD-10-CM | POA: Diagnosis not present

## 2018-02-24 DIAGNOSIS — N2581 Secondary hyperparathyroidism of renal origin: Secondary | ICD-10-CM | POA: Diagnosis not present

## 2018-02-24 DIAGNOSIS — E1129 Type 2 diabetes mellitus with other diabetic kidney complication: Secondary | ICD-10-CM | POA: Diagnosis not present

## 2018-02-24 DIAGNOSIS — D631 Anemia in chronic kidney disease: Secondary | ICD-10-CM | POA: Diagnosis not present

## 2018-02-27 DIAGNOSIS — N2581 Secondary hyperparathyroidism of renal origin: Secondary | ICD-10-CM | POA: Diagnosis not present

## 2018-02-27 DIAGNOSIS — D509 Iron deficiency anemia, unspecified: Secondary | ICD-10-CM | POA: Diagnosis not present

## 2018-02-27 DIAGNOSIS — E1129 Type 2 diabetes mellitus with other diabetic kidney complication: Secondary | ICD-10-CM | POA: Diagnosis not present

## 2018-02-27 DIAGNOSIS — D631 Anemia in chronic kidney disease: Secondary | ICD-10-CM | POA: Diagnosis not present

## 2018-02-27 DIAGNOSIS — N186 End stage renal disease: Secondary | ICD-10-CM | POA: Diagnosis not present

## 2018-03-01 DIAGNOSIS — D631 Anemia in chronic kidney disease: Secondary | ICD-10-CM | POA: Diagnosis not present

## 2018-03-01 DIAGNOSIS — E1129 Type 2 diabetes mellitus with other diabetic kidney complication: Secondary | ICD-10-CM | POA: Diagnosis not present

## 2018-03-01 DIAGNOSIS — D509 Iron deficiency anemia, unspecified: Secondary | ICD-10-CM | POA: Diagnosis not present

## 2018-03-01 DIAGNOSIS — N186 End stage renal disease: Secondary | ICD-10-CM | POA: Diagnosis not present

## 2018-03-01 DIAGNOSIS — N2581 Secondary hyperparathyroidism of renal origin: Secondary | ICD-10-CM | POA: Diagnosis not present

## 2018-03-01 DIAGNOSIS — D689 Coagulation defect, unspecified: Secondary | ICD-10-CM | POA: Diagnosis not present

## 2018-03-03 DIAGNOSIS — E1129 Type 2 diabetes mellitus with other diabetic kidney complication: Secondary | ICD-10-CM | POA: Diagnosis not present

## 2018-03-03 DIAGNOSIS — N186 End stage renal disease: Secondary | ICD-10-CM | POA: Diagnosis not present

## 2018-03-03 DIAGNOSIS — D509 Iron deficiency anemia, unspecified: Secondary | ICD-10-CM | POA: Diagnosis not present

## 2018-03-03 DIAGNOSIS — D631 Anemia in chronic kidney disease: Secondary | ICD-10-CM | POA: Diagnosis not present

## 2018-03-03 DIAGNOSIS — N2581 Secondary hyperparathyroidism of renal origin: Secondary | ICD-10-CM | POA: Diagnosis not present

## 2018-03-06 DIAGNOSIS — E1129 Type 2 diabetes mellitus with other diabetic kidney complication: Secondary | ICD-10-CM | POA: Diagnosis not present

## 2018-03-06 DIAGNOSIS — N2581 Secondary hyperparathyroidism of renal origin: Secondary | ICD-10-CM | POA: Diagnosis not present

## 2018-03-06 DIAGNOSIS — N186 End stage renal disease: Secondary | ICD-10-CM | POA: Diagnosis not present

## 2018-03-06 DIAGNOSIS — D631 Anemia in chronic kidney disease: Secondary | ICD-10-CM | POA: Diagnosis not present

## 2018-03-06 DIAGNOSIS — D509 Iron deficiency anemia, unspecified: Secondary | ICD-10-CM | POA: Diagnosis not present

## 2018-03-08 DIAGNOSIS — N2581 Secondary hyperparathyroidism of renal origin: Secondary | ICD-10-CM | POA: Diagnosis not present

## 2018-03-08 DIAGNOSIS — D689 Coagulation defect, unspecified: Secondary | ICD-10-CM | POA: Diagnosis not present

## 2018-03-08 DIAGNOSIS — D631 Anemia in chronic kidney disease: Secondary | ICD-10-CM | POA: Diagnosis not present

## 2018-03-08 DIAGNOSIS — E1129 Type 2 diabetes mellitus with other diabetic kidney complication: Secondary | ICD-10-CM | POA: Diagnosis not present

## 2018-03-08 DIAGNOSIS — D509 Iron deficiency anemia, unspecified: Secondary | ICD-10-CM | POA: Diagnosis not present

## 2018-03-08 DIAGNOSIS — N186 End stage renal disease: Secondary | ICD-10-CM | POA: Diagnosis not present

## 2018-03-10 DIAGNOSIS — D631 Anemia in chronic kidney disease: Secondary | ICD-10-CM | POA: Diagnosis not present

## 2018-03-10 DIAGNOSIS — N186 End stage renal disease: Secondary | ICD-10-CM | POA: Diagnosis not present

## 2018-03-10 DIAGNOSIS — N2581 Secondary hyperparathyroidism of renal origin: Secondary | ICD-10-CM | POA: Diagnosis not present

## 2018-03-10 DIAGNOSIS — D509 Iron deficiency anemia, unspecified: Secondary | ICD-10-CM | POA: Diagnosis not present

## 2018-03-10 DIAGNOSIS — E1129 Type 2 diabetes mellitus with other diabetic kidney complication: Secondary | ICD-10-CM | POA: Diagnosis not present

## 2018-03-13 DIAGNOSIS — E1129 Type 2 diabetes mellitus with other diabetic kidney complication: Secondary | ICD-10-CM | POA: Diagnosis not present

## 2018-03-13 DIAGNOSIS — N186 End stage renal disease: Secondary | ICD-10-CM | POA: Diagnosis not present

## 2018-03-13 DIAGNOSIS — D509 Iron deficiency anemia, unspecified: Secondary | ICD-10-CM | POA: Diagnosis not present

## 2018-03-13 DIAGNOSIS — N2581 Secondary hyperparathyroidism of renal origin: Secondary | ICD-10-CM | POA: Diagnosis not present

## 2018-03-13 DIAGNOSIS — D631 Anemia in chronic kidney disease: Secondary | ICD-10-CM | POA: Diagnosis not present

## 2018-03-15 DIAGNOSIS — E1129 Type 2 diabetes mellitus with other diabetic kidney complication: Secondary | ICD-10-CM | POA: Diagnosis not present

## 2018-03-15 DIAGNOSIS — N2581 Secondary hyperparathyroidism of renal origin: Secondary | ICD-10-CM | POA: Diagnosis not present

## 2018-03-15 DIAGNOSIS — N186 End stage renal disease: Secondary | ICD-10-CM | POA: Diagnosis not present

## 2018-03-15 DIAGNOSIS — D509 Iron deficiency anemia, unspecified: Secondary | ICD-10-CM | POA: Diagnosis not present

## 2018-03-15 DIAGNOSIS — D689 Coagulation defect, unspecified: Secondary | ICD-10-CM | POA: Diagnosis not present

## 2018-03-15 DIAGNOSIS — D631 Anemia in chronic kidney disease: Secondary | ICD-10-CM | POA: Diagnosis not present

## 2018-03-17 DIAGNOSIS — N2581 Secondary hyperparathyroidism of renal origin: Secondary | ICD-10-CM | POA: Diagnosis not present

## 2018-03-17 DIAGNOSIS — D631 Anemia in chronic kidney disease: Secondary | ICD-10-CM | POA: Diagnosis not present

## 2018-03-17 DIAGNOSIS — N186 End stage renal disease: Secondary | ICD-10-CM | POA: Diagnosis not present

## 2018-03-17 DIAGNOSIS — D509 Iron deficiency anemia, unspecified: Secondary | ICD-10-CM | POA: Diagnosis not present

## 2018-03-17 DIAGNOSIS — E1129 Type 2 diabetes mellitus with other diabetic kidney complication: Secondary | ICD-10-CM | POA: Diagnosis not present

## 2018-03-20 DIAGNOSIS — D631 Anemia in chronic kidney disease: Secondary | ICD-10-CM | POA: Diagnosis not present

## 2018-03-20 DIAGNOSIS — D509 Iron deficiency anemia, unspecified: Secondary | ICD-10-CM | POA: Diagnosis not present

## 2018-03-20 DIAGNOSIS — N2581 Secondary hyperparathyroidism of renal origin: Secondary | ICD-10-CM | POA: Diagnosis not present

## 2018-03-20 DIAGNOSIS — E1129 Type 2 diabetes mellitus with other diabetic kidney complication: Secondary | ICD-10-CM | POA: Diagnosis not present

## 2018-03-20 DIAGNOSIS — N186 End stage renal disease: Secondary | ICD-10-CM | POA: Diagnosis not present

## 2018-03-22 DIAGNOSIS — D631 Anemia in chronic kidney disease: Secondary | ICD-10-CM | POA: Diagnosis not present

## 2018-03-22 DIAGNOSIS — E1129 Type 2 diabetes mellitus with other diabetic kidney complication: Secondary | ICD-10-CM | POA: Diagnosis not present

## 2018-03-22 DIAGNOSIS — N2581 Secondary hyperparathyroidism of renal origin: Secondary | ICD-10-CM | POA: Diagnosis not present

## 2018-03-22 DIAGNOSIS — N186 End stage renal disease: Secondary | ICD-10-CM | POA: Diagnosis not present

## 2018-03-22 DIAGNOSIS — D689 Coagulation defect, unspecified: Secondary | ICD-10-CM | POA: Diagnosis not present

## 2018-03-22 DIAGNOSIS — D509 Iron deficiency anemia, unspecified: Secondary | ICD-10-CM | POA: Diagnosis not present

## 2018-03-24 DIAGNOSIS — N186 End stage renal disease: Secondary | ICD-10-CM | POA: Diagnosis not present

## 2018-03-24 DIAGNOSIS — I120 Hypertensive chronic kidney disease with stage 5 chronic kidney disease or end stage renal disease: Secondary | ICD-10-CM | POA: Diagnosis not present

## 2018-03-24 DIAGNOSIS — E1129 Type 2 diabetes mellitus with other diabetic kidney complication: Secondary | ICD-10-CM | POA: Diagnosis not present

## 2018-03-24 DIAGNOSIS — Z992 Dependence on renal dialysis: Secondary | ICD-10-CM | POA: Diagnosis not present

## 2018-03-24 DIAGNOSIS — N2581 Secondary hyperparathyroidism of renal origin: Secondary | ICD-10-CM | POA: Diagnosis not present

## 2018-03-27 DIAGNOSIS — Z992 Dependence on renal dialysis: Secondary | ICD-10-CM | POA: Diagnosis not present

## 2018-03-27 DIAGNOSIS — E1129 Type 2 diabetes mellitus with other diabetic kidney complication: Secondary | ICD-10-CM | POA: Diagnosis not present

## 2018-03-27 DIAGNOSIS — N186 End stage renal disease: Secondary | ICD-10-CM | POA: Diagnosis not present

## 2018-03-27 DIAGNOSIS — N2581 Secondary hyperparathyroidism of renal origin: Secondary | ICD-10-CM | POA: Diagnosis not present

## 2018-03-28 DIAGNOSIS — I120 Hypertensive chronic kidney disease with stage 5 chronic kidney disease or end stage renal disease: Secondary | ICD-10-CM | POA: Diagnosis not present

## 2018-03-28 DIAGNOSIS — I2510 Atherosclerotic heart disease of native coronary artery without angina pectoris: Secondary | ICD-10-CM | POA: Diagnosis not present

## 2018-03-28 DIAGNOSIS — Z7901 Long term (current) use of anticoagulants: Secondary | ICD-10-CM | POA: Diagnosis not present

## 2018-03-28 DIAGNOSIS — N186 End stage renal disease: Secondary | ICD-10-CM | POA: Diagnosis not present

## 2018-03-28 DIAGNOSIS — I5022 Chronic systolic (congestive) heart failure: Secondary | ICD-10-CM | POA: Diagnosis not present

## 2018-03-29 DIAGNOSIS — N186 End stage renal disease: Secondary | ICD-10-CM | POA: Diagnosis not present

## 2018-03-29 DIAGNOSIS — N2581 Secondary hyperparathyroidism of renal origin: Secondary | ICD-10-CM | POA: Diagnosis not present

## 2018-03-29 DIAGNOSIS — E1129 Type 2 diabetes mellitus with other diabetic kidney complication: Secondary | ICD-10-CM | POA: Diagnosis not present

## 2018-03-29 DIAGNOSIS — D631 Anemia in chronic kidney disease: Secondary | ICD-10-CM | POA: Diagnosis not present

## 2018-03-29 DIAGNOSIS — D689 Coagulation defect, unspecified: Secondary | ICD-10-CM | POA: Diagnosis not present

## 2018-03-29 DIAGNOSIS — Z992 Dependence on renal dialysis: Secondary | ICD-10-CM | POA: Diagnosis not present

## 2018-03-30 ENCOUNTER — Ambulatory Visit: Payer: Self-pay | Admitting: Internal Medicine

## 2018-03-31 DIAGNOSIS — E1129 Type 2 diabetes mellitus with other diabetic kidney complication: Secondary | ICD-10-CM | POA: Diagnosis not present

## 2018-03-31 DIAGNOSIS — N186 End stage renal disease: Secondary | ICD-10-CM | POA: Diagnosis not present

## 2018-03-31 DIAGNOSIS — Z992 Dependence on renal dialysis: Secondary | ICD-10-CM | POA: Diagnosis not present

## 2018-03-31 DIAGNOSIS — N2581 Secondary hyperparathyroidism of renal origin: Secondary | ICD-10-CM | POA: Diagnosis not present

## 2018-04-03 DIAGNOSIS — N2581 Secondary hyperparathyroidism of renal origin: Secondary | ICD-10-CM | POA: Diagnosis not present

## 2018-04-03 DIAGNOSIS — Z992 Dependence on renal dialysis: Secondary | ICD-10-CM | POA: Diagnosis not present

## 2018-04-03 DIAGNOSIS — E1129 Type 2 diabetes mellitus with other diabetic kidney complication: Secondary | ICD-10-CM | POA: Diagnosis not present

## 2018-04-03 DIAGNOSIS — N186 End stage renal disease: Secondary | ICD-10-CM | POA: Diagnosis not present

## 2018-04-05 DIAGNOSIS — E1129 Type 2 diabetes mellitus with other diabetic kidney complication: Secondary | ICD-10-CM | POA: Diagnosis not present

## 2018-04-05 DIAGNOSIS — D689 Coagulation defect, unspecified: Secondary | ICD-10-CM | POA: Diagnosis not present

## 2018-04-05 DIAGNOSIS — N186 End stage renal disease: Secondary | ICD-10-CM | POA: Diagnosis not present

## 2018-04-05 DIAGNOSIS — D631 Anemia in chronic kidney disease: Secondary | ICD-10-CM | POA: Diagnosis not present

## 2018-04-05 DIAGNOSIS — N2581 Secondary hyperparathyroidism of renal origin: Secondary | ICD-10-CM | POA: Diagnosis not present

## 2018-04-05 DIAGNOSIS — Z992 Dependence on renal dialysis: Secondary | ICD-10-CM | POA: Diagnosis not present

## 2018-04-07 DIAGNOSIS — N186 End stage renal disease: Secondary | ICD-10-CM | POA: Diagnosis not present

## 2018-04-07 DIAGNOSIS — E1129 Type 2 diabetes mellitus with other diabetic kidney complication: Secondary | ICD-10-CM | POA: Diagnosis not present

## 2018-04-07 DIAGNOSIS — Z992 Dependence on renal dialysis: Secondary | ICD-10-CM | POA: Diagnosis not present

## 2018-04-07 DIAGNOSIS — N2581 Secondary hyperparathyroidism of renal origin: Secondary | ICD-10-CM | POA: Diagnosis not present

## 2018-04-10 DIAGNOSIS — Z992 Dependence on renal dialysis: Secondary | ICD-10-CM | POA: Diagnosis not present

## 2018-04-10 DIAGNOSIS — N186 End stage renal disease: Secondary | ICD-10-CM | POA: Diagnosis not present

## 2018-04-10 DIAGNOSIS — E1129 Type 2 diabetes mellitus with other diabetic kidney complication: Secondary | ICD-10-CM | POA: Diagnosis not present

## 2018-04-10 DIAGNOSIS — N2581 Secondary hyperparathyroidism of renal origin: Secondary | ICD-10-CM | POA: Diagnosis not present

## 2018-04-12 DIAGNOSIS — D631 Anemia in chronic kidney disease: Secondary | ICD-10-CM | POA: Diagnosis not present

## 2018-04-12 DIAGNOSIS — E1129 Type 2 diabetes mellitus with other diabetic kidney complication: Secondary | ICD-10-CM | POA: Diagnosis not present

## 2018-04-12 DIAGNOSIS — Z992 Dependence on renal dialysis: Secondary | ICD-10-CM | POA: Diagnosis not present

## 2018-04-12 DIAGNOSIS — D689 Coagulation defect, unspecified: Secondary | ICD-10-CM | POA: Diagnosis not present

## 2018-04-12 DIAGNOSIS — N186 End stage renal disease: Secondary | ICD-10-CM | POA: Diagnosis not present

## 2018-04-12 DIAGNOSIS — N2581 Secondary hyperparathyroidism of renal origin: Secondary | ICD-10-CM | POA: Diagnosis not present

## 2018-04-14 DIAGNOSIS — E1129 Type 2 diabetes mellitus with other diabetic kidney complication: Secondary | ICD-10-CM | POA: Diagnosis not present

## 2018-04-14 DIAGNOSIS — N186 End stage renal disease: Secondary | ICD-10-CM | POA: Diagnosis not present

## 2018-04-14 DIAGNOSIS — N2581 Secondary hyperparathyroidism of renal origin: Secondary | ICD-10-CM | POA: Diagnosis not present

## 2018-04-14 DIAGNOSIS — Z992 Dependence on renal dialysis: Secondary | ICD-10-CM | POA: Diagnosis not present

## 2018-04-16 DIAGNOSIS — Z992 Dependence on renal dialysis: Secondary | ICD-10-CM | POA: Diagnosis not present

## 2018-04-16 DIAGNOSIS — E1129 Type 2 diabetes mellitus with other diabetic kidney complication: Secondary | ICD-10-CM | POA: Diagnosis not present

## 2018-04-16 DIAGNOSIS — N186 End stage renal disease: Secondary | ICD-10-CM | POA: Diagnosis not present

## 2018-04-16 DIAGNOSIS — N2581 Secondary hyperparathyroidism of renal origin: Secondary | ICD-10-CM | POA: Diagnosis not present

## 2018-04-17 DIAGNOSIS — N186 End stage renal disease: Secondary | ICD-10-CM | POA: Diagnosis not present

## 2018-04-17 DIAGNOSIS — Z992 Dependence on renal dialysis: Secondary | ICD-10-CM | POA: Diagnosis not present

## 2018-04-17 DIAGNOSIS — I871 Compression of vein: Secondary | ICD-10-CM | POA: Diagnosis not present

## 2018-04-17 DIAGNOSIS — T82858A Stenosis of vascular prosthetic devices, implants and grafts, initial encounter: Secondary | ICD-10-CM | POA: Diagnosis not present

## 2018-04-18 DIAGNOSIS — E1129 Type 2 diabetes mellitus with other diabetic kidney complication: Secondary | ICD-10-CM | POA: Diagnosis not present

## 2018-04-18 DIAGNOSIS — N2581 Secondary hyperparathyroidism of renal origin: Secondary | ICD-10-CM | POA: Diagnosis not present

## 2018-04-18 DIAGNOSIS — Z992 Dependence on renal dialysis: Secondary | ICD-10-CM | POA: Diagnosis not present

## 2018-04-18 DIAGNOSIS — N186 End stage renal disease: Secondary | ICD-10-CM | POA: Diagnosis not present

## 2018-04-18 DIAGNOSIS — D689 Coagulation defect, unspecified: Secondary | ICD-10-CM | POA: Diagnosis not present

## 2018-04-18 DIAGNOSIS — D631 Anemia in chronic kidney disease: Secondary | ICD-10-CM | POA: Diagnosis not present

## 2018-04-21 DIAGNOSIS — N186 End stage renal disease: Secondary | ICD-10-CM | POA: Diagnosis not present

## 2018-04-21 DIAGNOSIS — N2581 Secondary hyperparathyroidism of renal origin: Secondary | ICD-10-CM | POA: Diagnosis not present

## 2018-04-21 DIAGNOSIS — Z992 Dependence on renal dialysis: Secondary | ICD-10-CM | POA: Diagnosis not present

## 2018-04-21 DIAGNOSIS — E1129 Type 2 diabetes mellitus with other diabetic kidney complication: Secondary | ICD-10-CM | POA: Diagnosis not present

## 2018-04-23 DIAGNOSIS — N186 End stage renal disease: Secondary | ICD-10-CM | POA: Diagnosis not present

## 2018-04-23 DIAGNOSIS — Z992 Dependence on renal dialysis: Secondary | ICD-10-CM | POA: Diagnosis not present

## 2018-04-23 DIAGNOSIS — I120 Hypertensive chronic kidney disease with stage 5 chronic kidney disease or end stage renal disease: Secondary | ICD-10-CM | POA: Diagnosis not present

## 2018-04-24 DIAGNOSIS — N186 End stage renal disease: Secondary | ICD-10-CM | POA: Diagnosis not present

## 2018-04-24 DIAGNOSIS — N2581 Secondary hyperparathyroidism of renal origin: Secondary | ICD-10-CM | POA: Diagnosis not present

## 2018-04-24 DIAGNOSIS — D631 Anemia in chronic kidney disease: Secondary | ICD-10-CM | POA: Diagnosis not present

## 2018-04-24 DIAGNOSIS — E1129 Type 2 diabetes mellitus with other diabetic kidney complication: Secondary | ICD-10-CM | POA: Diagnosis not present

## 2018-04-24 DIAGNOSIS — Z992 Dependence on renal dialysis: Secondary | ICD-10-CM | POA: Diagnosis not present

## 2018-04-26 DIAGNOSIS — N186 End stage renal disease: Secondary | ICD-10-CM | POA: Diagnosis not present

## 2018-04-26 DIAGNOSIS — D631 Anemia in chronic kidney disease: Secondary | ICD-10-CM | POA: Diagnosis not present

## 2018-04-26 DIAGNOSIS — N2581 Secondary hyperparathyroidism of renal origin: Secondary | ICD-10-CM | POA: Diagnosis not present

## 2018-04-26 DIAGNOSIS — Z992 Dependence on renal dialysis: Secondary | ICD-10-CM | POA: Diagnosis not present

## 2018-04-26 DIAGNOSIS — D689 Coagulation defect, unspecified: Secondary | ICD-10-CM | POA: Diagnosis not present

## 2018-04-26 DIAGNOSIS — E1129 Type 2 diabetes mellitus with other diabetic kidney complication: Secondary | ICD-10-CM | POA: Diagnosis not present

## 2018-04-28 DIAGNOSIS — D631 Anemia in chronic kidney disease: Secondary | ICD-10-CM | POA: Diagnosis not present

## 2018-04-28 DIAGNOSIS — N186 End stage renal disease: Secondary | ICD-10-CM | POA: Diagnosis not present

## 2018-04-28 DIAGNOSIS — Z992 Dependence on renal dialysis: Secondary | ICD-10-CM | POA: Diagnosis not present

## 2018-04-28 DIAGNOSIS — N2581 Secondary hyperparathyroidism of renal origin: Secondary | ICD-10-CM | POA: Diagnosis not present

## 2018-04-28 DIAGNOSIS — E1129 Type 2 diabetes mellitus with other diabetic kidney complication: Secondary | ICD-10-CM | POA: Diagnosis not present

## 2018-04-29 DIAGNOSIS — N186 End stage renal disease: Secondary | ICD-10-CM | POA: Diagnosis not present

## 2018-04-29 DIAGNOSIS — N2581 Secondary hyperparathyroidism of renal origin: Secondary | ICD-10-CM | POA: Diagnosis not present

## 2018-04-29 DIAGNOSIS — E8770 Fluid overload, unspecified: Secondary | ICD-10-CM | POA: Diagnosis not present

## 2018-05-01 DIAGNOSIS — E1129 Type 2 diabetes mellitus with other diabetic kidney complication: Secondary | ICD-10-CM | POA: Diagnosis not present

## 2018-05-01 DIAGNOSIS — Z992 Dependence on renal dialysis: Secondary | ICD-10-CM | POA: Diagnosis not present

## 2018-05-01 DIAGNOSIS — N186 End stage renal disease: Secondary | ICD-10-CM | POA: Diagnosis not present

## 2018-05-01 DIAGNOSIS — D631 Anemia in chronic kidney disease: Secondary | ICD-10-CM | POA: Diagnosis not present

## 2018-05-01 DIAGNOSIS — N2581 Secondary hyperparathyroidism of renal origin: Secondary | ICD-10-CM | POA: Diagnosis not present

## 2018-05-03 DIAGNOSIS — D631 Anemia in chronic kidney disease: Secondary | ICD-10-CM | POA: Diagnosis not present

## 2018-05-03 DIAGNOSIS — Z992 Dependence on renal dialysis: Secondary | ICD-10-CM | POA: Diagnosis not present

## 2018-05-03 DIAGNOSIS — E1129 Type 2 diabetes mellitus with other diabetic kidney complication: Secondary | ICD-10-CM | POA: Diagnosis not present

## 2018-05-03 DIAGNOSIS — N186 End stage renal disease: Secondary | ICD-10-CM | POA: Diagnosis not present

## 2018-05-03 DIAGNOSIS — D689 Coagulation defect, unspecified: Secondary | ICD-10-CM | POA: Diagnosis not present

## 2018-05-03 DIAGNOSIS — N2581 Secondary hyperparathyroidism of renal origin: Secondary | ICD-10-CM | POA: Diagnosis not present

## 2018-05-05 DIAGNOSIS — Z992 Dependence on renal dialysis: Secondary | ICD-10-CM | POA: Diagnosis not present

## 2018-05-05 DIAGNOSIS — E1129 Type 2 diabetes mellitus with other diabetic kidney complication: Secondary | ICD-10-CM | POA: Diagnosis not present

## 2018-05-05 DIAGNOSIS — D631 Anemia in chronic kidney disease: Secondary | ICD-10-CM | POA: Diagnosis not present

## 2018-05-05 DIAGNOSIS — N2581 Secondary hyperparathyroidism of renal origin: Secondary | ICD-10-CM | POA: Diagnosis not present

## 2018-05-05 DIAGNOSIS — N186 End stage renal disease: Secondary | ICD-10-CM | POA: Diagnosis not present

## 2018-05-08 DIAGNOSIS — Z992 Dependence on renal dialysis: Secondary | ICD-10-CM | POA: Diagnosis not present

## 2018-05-08 DIAGNOSIS — N2581 Secondary hyperparathyroidism of renal origin: Secondary | ICD-10-CM | POA: Diagnosis not present

## 2018-05-08 DIAGNOSIS — E1129 Type 2 diabetes mellitus with other diabetic kidney complication: Secondary | ICD-10-CM | POA: Diagnosis not present

## 2018-05-08 DIAGNOSIS — N186 End stage renal disease: Secondary | ICD-10-CM | POA: Diagnosis not present

## 2018-05-08 DIAGNOSIS — D631 Anemia in chronic kidney disease: Secondary | ICD-10-CM | POA: Diagnosis not present

## 2018-05-10 DIAGNOSIS — D631 Anemia in chronic kidney disease: Secondary | ICD-10-CM | POA: Diagnosis not present

## 2018-05-10 DIAGNOSIS — N2581 Secondary hyperparathyroidism of renal origin: Secondary | ICD-10-CM | POA: Diagnosis not present

## 2018-05-10 DIAGNOSIS — N186 End stage renal disease: Secondary | ICD-10-CM | POA: Diagnosis not present

## 2018-05-10 DIAGNOSIS — Z992 Dependence on renal dialysis: Secondary | ICD-10-CM | POA: Diagnosis not present

## 2018-05-10 DIAGNOSIS — E1129 Type 2 diabetes mellitus with other diabetic kidney complication: Secondary | ICD-10-CM | POA: Diagnosis not present

## 2018-05-10 DIAGNOSIS — D689 Coagulation defect, unspecified: Secondary | ICD-10-CM | POA: Diagnosis not present

## 2018-05-12 DIAGNOSIS — D631 Anemia in chronic kidney disease: Secondary | ICD-10-CM | POA: Diagnosis not present

## 2018-05-12 DIAGNOSIS — Z992 Dependence on renal dialysis: Secondary | ICD-10-CM | POA: Diagnosis not present

## 2018-05-12 DIAGNOSIS — N186 End stage renal disease: Secondary | ICD-10-CM | POA: Diagnosis not present

## 2018-05-12 DIAGNOSIS — E1129 Type 2 diabetes mellitus with other diabetic kidney complication: Secondary | ICD-10-CM | POA: Diagnosis not present

## 2018-05-12 DIAGNOSIS — N2581 Secondary hyperparathyroidism of renal origin: Secondary | ICD-10-CM | POA: Diagnosis not present

## 2018-05-14 DIAGNOSIS — E1129 Type 2 diabetes mellitus with other diabetic kidney complication: Secondary | ICD-10-CM | POA: Diagnosis not present

## 2018-05-14 DIAGNOSIS — Z992 Dependence on renal dialysis: Secondary | ICD-10-CM | POA: Diagnosis not present

## 2018-05-14 DIAGNOSIS — I4892 Unspecified atrial flutter: Secondary | ICD-10-CM | POA: Diagnosis not present

## 2018-05-14 DIAGNOSIS — D631 Anemia in chronic kidney disease: Secondary | ICD-10-CM | POA: Diagnosis not present

## 2018-05-14 DIAGNOSIS — N2581 Secondary hyperparathyroidism of renal origin: Secondary | ICD-10-CM | POA: Diagnosis not present

## 2018-05-14 DIAGNOSIS — N186 End stage renal disease: Secondary | ICD-10-CM | POA: Diagnosis not present

## 2018-05-16 DIAGNOSIS — E1129 Type 2 diabetes mellitus with other diabetic kidney complication: Secondary | ICD-10-CM | POA: Diagnosis not present

## 2018-05-16 DIAGNOSIS — D631 Anemia in chronic kidney disease: Secondary | ICD-10-CM | POA: Diagnosis not present

## 2018-05-16 DIAGNOSIS — N186 End stage renal disease: Secondary | ICD-10-CM | POA: Diagnosis not present

## 2018-05-16 DIAGNOSIS — Z992 Dependence on renal dialysis: Secondary | ICD-10-CM | POA: Diagnosis not present

## 2018-05-16 DIAGNOSIS — N2581 Secondary hyperparathyroidism of renal origin: Secondary | ICD-10-CM | POA: Diagnosis not present

## 2018-05-19 DIAGNOSIS — E1129 Type 2 diabetes mellitus with other diabetic kidney complication: Secondary | ICD-10-CM | POA: Diagnosis not present

## 2018-05-19 DIAGNOSIS — N2581 Secondary hyperparathyroidism of renal origin: Secondary | ICD-10-CM | POA: Diagnosis not present

## 2018-05-19 DIAGNOSIS — D631 Anemia in chronic kidney disease: Secondary | ICD-10-CM | POA: Diagnosis not present

## 2018-05-19 DIAGNOSIS — N186 End stage renal disease: Secondary | ICD-10-CM | POA: Diagnosis not present

## 2018-05-19 DIAGNOSIS — Z992 Dependence on renal dialysis: Secondary | ICD-10-CM | POA: Diagnosis not present

## 2018-05-21 DIAGNOSIS — Z992 Dependence on renal dialysis: Secondary | ICD-10-CM | POA: Diagnosis not present

## 2018-05-21 DIAGNOSIS — E1129 Type 2 diabetes mellitus with other diabetic kidney complication: Secondary | ICD-10-CM | POA: Diagnosis not present

## 2018-05-21 DIAGNOSIS — N186 End stage renal disease: Secondary | ICD-10-CM | POA: Diagnosis not present

## 2018-05-21 DIAGNOSIS — N2581 Secondary hyperparathyroidism of renal origin: Secondary | ICD-10-CM | POA: Diagnosis not present

## 2018-05-21 DIAGNOSIS — D631 Anemia in chronic kidney disease: Secondary | ICD-10-CM | POA: Diagnosis not present

## 2018-05-23 DIAGNOSIS — Z992 Dependence on renal dialysis: Secondary | ICD-10-CM | POA: Diagnosis not present

## 2018-05-23 DIAGNOSIS — N2581 Secondary hyperparathyroidism of renal origin: Secondary | ICD-10-CM | POA: Diagnosis not present

## 2018-05-23 DIAGNOSIS — D631 Anemia in chronic kidney disease: Secondary | ICD-10-CM | POA: Diagnosis not present

## 2018-05-23 DIAGNOSIS — D689 Coagulation defect, unspecified: Secondary | ICD-10-CM | POA: Diagnosis not present

## 2018-05-23 DIAGNOSIS — N186 End stage renal disease: Secondary | ICD-10-CM | POA: Diagnosis not present

## 2018-05-23 DIAGNOSIS — E1129 Type 2 diabetes mellitus with other diabetic kidney complication: Secondary | ICD-10-CM | POA: Diagnosis not present

## 2018-05-24 DIAGNOSIS — N186 End stage renal disease: Secondary | ICD-10-CM | POA: Diagnosis not present

## 2018-05-24 DIAGNOSIS — I120 Hypertensive chronic kidney disease with stage 5 chronic kidney disease or end stage renal disease: Secondary | ICD-10-CM | POA: Diagnosis not present

## 2018-05-24 DIAGNOSIS — Z992 Dependence on renal dialysis: Secondary | ICD-10-CM | POA: Diagnosis not present

## 2018-05-26 DIAGNOSIS — Z992 Dependence on renal dialysis: Secondary | ICD-10-CM | POA: Diagnosis not present

## 2018-05-26 DIAGNOSIS — N186 End stage renal disease: Secondary | ICD-10-CM | POA: Diagnosis not present

## 2018-05-26 DIAGNOSIS — N2581 Secondary hyperparathyroidism of renal origin: Secondary | ICD-10-CM | POA: Diagnosis not present

## 2018-05-26 DIAGNOSIS — E1129 Type 2 diabetes mellitus with other diabetic kidney complication: Secondary | ICD-10-CM | POA: Diagnosis not present

## 2018-05-26 DIAGNOSIS — D631 Anemia in chronic kidney disease: Secondary | ICD-10-CM | POA: Diagnosis not present

## 2018-05-29 DIAGNOSIS — D631 Anemia in chronic kidney disease: Secondary | ICD-10-CM | POA: Diagnosis not present

## 2018-05-29 DIAGNOSIS — E1129 Type 2 diabetes mellitus with other diabetic kidney complication: Secondary | ICD-10-CM | POA: Diagnosis not present

## 2018-05-29 DIAGNOSIS — Z992 Dependence on renal dialysis: Secondary | ICD-10-CM | POA: Diagnosis not present

## 2018-05-29 DIAGNOSIS — N186 End stage renal disease: Secondary | ICD-10-CM | POA: Diagnosis not present

## 2018-05-29 DIAGNOSIS — N2581 Secondary hyperparathyroidism of renal origin: Secondary | ICD-10-CM | POA: Diagnosis not present

## 2018-05-30 DIAGNOSIS — I5022 Chronic systolic (congestive) heart failure: Secondary | ICD-10-CM | POA: Diagnosis not present

## 2018-05-30 DIAGNOSIS — Z7901 Long term (current) use of anticoagulants: Secondary | ICD-10-CM | POA: Diagnosis not present

## 2018-05-30 DIAGNOSIS — I120 Hypertensive chronic kidney disease with stage 5 chronic kidney disease or end stage renal disease: Secondary | ICD-10-CM | POA: Diagnosis not present

## 2018-05-30 DIAGNOSIS — I2510 Atherosclerotic heart disease of native coronary artery without angina pectoris: Secondary | ICD-10-CM | POA: Diagnosis not present

## 2018-05-30 DIAGNOSIS — N186 End stage renal disease: Secondary | ICD-10-CM | POA: Diagnosis not present

## 2018-05-31 DIAGNOSIS — D689 Coagulation defect, unspecified: Secondary | ICD-10-CM | POA: Diagnosis not present

## 2018-05-31 DIAGNOSIS — Z992 Dependence on renal dialysis: Secondary | ICD-10-CM | POA: Diagnosis not present

## 2018-05-31 DIAGNOSIS — D631 Anemia in chronic kidney disease: Secondary | ICD-10-CM | POA: Diagnosis not present

## 2018-05-31 DIAGNOSIS — E1129 Type 2 diabetes mellitus with other diabetic kidney complication: Secondary | ICD-10-CM | POA: Diagnosis not present

## 2018-05-31 DIAGNOSIS — N2581 Secondary hyperparathyroidism of renal origin: Secondary | ICD-10-CM | POA: Diagnosis not present

## 2018-05-31 DIAGNOSIS — N186 End stage renal disease: Secondary | ICD-10-CM | POA: Diagnosis not present

## 2018-06-02 DIAGNOSIS — E1129 Type 2 diabetes mellitus with other diabetic kidney complication: Secondary | ICD-10-CM | POA: Diagnosis not present

## 2018-06-02 DIAGNOSIS — N186 End stage renal disease: Secondary | ICD-10-CM | POA: Diagnosis not present

## 2018-06-02 DIAGNOSIS — Z992 Dependence on renal dialysis: Secondary | ICD-10-CM | POA: Diagnosis not present

## 2018-06-02 DIAGNOSIS — D631 Anemia in chronic kidney disease: Secondary | ICD-10-CM | POA: Diagnosis not present

## 2018-06-02 DIAGNOSIS — N2581 Secondary hyperparathyroidism of renal origin: Secondary | ICD-10-CM | POA: Diagnosis not present

## 2018-06-05 DIAGNOSIS — E1129 Type 2 diabetes mellitus with other diabetic kidney complication: Secondary | ICD-10-CM | POA: Diagnosis not present

## 2018-06-05 DIAGNOSIS — D631 Anemia in chronic kidney disease: Secondary | ICD-10-CM | POA: Diagnosis not present

## 2018-06-05 DIAGNOSIS — N2581 Secondary hyperparathyroidism of renal origin: Secondary | ICD-10-CM | POA: Diagnosis not present

## 2018-06-05 DIAGNOSIS — Z992 Dependence on renal dialysis: Secondary | ICD-10-CM | POA: Diagnosis not present

## 2018-06-05 DIAGNOSIS — N186 End stage renal disease: Secondary | ICD-10-CM | POA: Diagnosis not present

## 2018-06-07 DIAGNOSIS — E1129 Type 2 diabetes mellitus with other diabetic kidney complication: Secondary | ICD-10-CM | POA: Diagnosis not present

## 2018-06-07 DIAGNOSIS — N2581 Secondary hyperparathyroidism of renal origin: Secondary | ICD-10-CM | POA: Diagnosis not present

## 2018-06-07 DIAGNOSIS — Z992 Dependence on renal dialysis: Secondary | ICD-10-CM | POA: Diagnosis not present

## 2018-06-07 DIAGNOSIS — D631 Anemia in chronic kidney disease: Secondary | ICD-10-CM | POA: Diagnosis not present

## 2018-06-07 DIAGNOSIS — N186 End stage renal disease: Secondary | ICD-10-CM | POA: Diagnosis not present

## 2018-06-07 DIAGNOSIS — D689 Coagulation defect, unspecified: Secondary | ICD-10-CM | POA: Diagnosis not present

## 2018-06-09 DIAGNOSIS — D631 Anemia in chronic kidney disease: Secondary | ICD-10-CM | POA: Diagnosis not present

## 2018-06-09 DIAGNOSIS — N2581 Secondary hyperparathyroidism of renal origin: Secondary | ICD-10-CM | POA: Diagnosis not present

## 2018-06-09 DIAGNOSIS — N186 End stage renal disease: Secondary | ICD-10-CM | POA: Diagnosis not present

## 2018-06-09 DIAGNOSIS — Z992 Dependence on renal dialysis: Secondary | ICD-10-CM | POA: Diagnosis not present

## 2018-06-09 DIAGNOSIS — E1129 Type 2 diabetes mellitus with other diabetic kidney complication: Secondary | ICD-10-CM | POA: Diagnosis not present

## 2018-06-12 DIAGNOSIS — E1129 Type 2 diabetes mellitus with other diabetic kidney complication: Secondary | ICD-10-CM | POA: Diagnosis not present

## 2018-06-12 DIAGNOSIS — N2581 Secondary hyperparathyroidism of renal origin: Secondary | ICD-10-CM | POA: Diagnosis not present

## 2018-06-12 DIAGNOSIS — D631 Anemia in chronic kidney disease: Secondary | ICD-10-CM | POA: Diagnosis not present

## 2018-06-12 DIAGNOSIS — N186 End stage renal disease: Secondary | ICD-10-CM | POA: Diagnosis not present

## 2018-06-12 DIAGNOSIS — Z992 Dependence on renal dialysis: Secondary | ICD-10-CM | POA: Diagnosis not present

## 2018-06-14 DIAGNOSIS — N2581 Secondary hyperparathyroidism of renal origin: Secondary | ICD-10-CM | POA: Diagnosis not present

## 2018-06-14 DIAGNOSIS — I4892 Unspecified atrial flutter: Secondary | ICD-10-CM | POA: Diagnosis not present

## 2018-06-14 DIAGNOSIS — D631 Anemia in chronic kidney disease: Secondary | ICD-10-CM | POA: Diagnosis not present

## 2018-06-14 DIAGNOSIS — E1129 Type 2 diabetes mellitus with other diabetic kidney complication: Secondary | ICD-10-CM | POA: Diagnosis not present

## 2018-06-14 DIAGNOSIS — Z992 Dependence on renal dialysis: Secondary | ICD-10-CM | POA: Diagnosis not present

## 2018-06-14 DIAGNOSIS — N186 End stage renal disease: Secondary | ICD-10-CM | POA: Diagnosis not present

## 2018-06-16 DIAGNOSIS — E1129 Type 2 diabetes mellitus with other diabetic kidney complication: Secondary | ICD-10-CM | POA: Diagnosis not present

## 2018-06-16 DIAGNOSIS — N2581 Secondary hyperparathyroidism of renal origin: Secondary | ICD-10-CM | POA: Diagnosis not present

## 2018-06-16 DIAGNOSIS — N186 End stage renal disease: Secondary | ICD-10-CM | POA: Diagnosis not present

## 2018-06-16 DIAGNOSIS — Z992 Dependence on renal dialysis: Secondary | ICD-10-CM | POA: Diagnosis not present

## 2018-06-16 DIAGNOSIS — D631 Anemia in chronic kidney disease: Secondary | ICD-10-CM | POA: Diagnosis not present

## 2018-06-19 DIAGNOSIS — E1129 Type 2 diabetes mellitus with other diabetic kidney complication: Secondary | ICD-10-CM | POA: Diagnosis not present

## 2018-06-19 DIAGNOSIS — N2581 Secondary hyperparathyroidism of renal origin: Secondary | ICD-10-CM | POA: Diagnosis not present

## 2018-06-19 DIAGNOSIS — D631 Anemia in chronic kidney disease: Secondary | ICD-10-CM | POA: Diagnosis not present

## 2018-06-19 DIAGNOSIS — N186 End stage renal disease: Secondary | ICD-10-CM | POA: Diagnosis not present

## 2018-06-19 DIAGNOSIS — Z992 Dependence on renal dialysis: Secondary | ICD-10-CM | POA: Diagnosis not present

## 2018-06-20 ENCOUNTER — Other Ambulatory Visit: Payer: Self-pay

## 2018-06-20 ENCOUNTER — Inpatient Hospital Stay (HOSPITAL_COMMUNITY)
Admission: AD | Admit: 2018-06-20 | Discharge: 2018-06-22 | DRG: 917 | Disposition: A | Discharge status: Home / Self Care | Payer: Medicare Other | Source: Ambulatory Visit | Attending: Cardiovascular Disease | Admitting: Cardiovascular Disease

## 2018-06-20 ENCOUNTER — Ambulatory Visit (INDEPENDENT_AMBULATORY_CARE_PROVIDER_SITE_OTHER): Payer: Medicare Other | Admitting: Internal Medicine

## 2018-06-20 ENCOUNTER — Encounter (HOSPITAL_BASED_OUTPATIENT_CLINIC_OR_DEPARTMENT_OTHER): Payer: Self-pay | Admitting: *Deleted

## 2018-06-20 ENCOUNTER — Emergency Department (HOSPITAL_BASED_OUTPATIENT_CLINIC_OR_DEPARTMENT_OTHER)
Admission: EM | Admit: 2018-06-20 | Discharge: 2018-06-20 | Disposition: A | Discharge status: Home / Self Care | Payer: Medicare Other | Source: Home / Self Care | Attending: Emergency Medicine | Admitting: Emergency Medicine

## 2018-06-20 ENCOUNTER — Encounter: Payer: Self-pay | Admitting: Internal Medicine

## 2018-06-20 ENCOUNTER — Encounter (HOSPITAL_COMMUNITY): Payer: Self-pay | Admitting: *Deleted

## 2018-06-20 VITALS — BP 100/64 | HR 99 | Temp 98.7°F | Ht 73.0 in | Wt 239.0 lb

## 2018-06-20 DIAGNOSIS — Z905 Acquired absence of kidney: Secondary | ICD-10-CM

## 2018-06-20 DIAGNOSIS — I5042 Chronic combined systolic (congestive) and diastolic (congestive) heart failure: Secondary | ICD-10-CM

## 2018-06-20 DIAGNOSIS — I4892 Unspecified atrial flutter: Secondary | ICD-10-CM | POA: Diagnosis present

## 2018-06-20 DIAGNOSIS — I252 Old myocardial infarction: Secondary | ICD-10-CM | POA: Insufficient documentation

## 2018-06-20 DIAGNOSIS — Z992 Dependence on renal dialysis: Secondary | ICD-10-CM

## 2018-06-20 DIAGNOSIS — E7800 Pure hypercholesterolemia, unspecified: Secondary | ICD-10-CM

## 2018-06-20 DIAGNOSIS — Z7901 Long term (current) use of anticoagulants: Secondary | ICD-10-CM

## 2018-06-20 DIAGNOSIS — D638 Anemia in other chronic diseases classified elsewhere: Secondary | ICD-10-CM | POA: Diagnosis not present

## 2018-06-20 DIAGNOSIS — D631 Anemia in chronic kidney disease: Secondary | ICD-10-CM | POA: Diagnosis present

## 2018-06-20 DIAGNOSIS — T82838A Hemorrhage of vascular prosthetic devices, implants and grafts, initial encounter: Secondary | ICD-10-CM | POA: Insufficient documentation

## 2018-06-20 DIAGNOSIS — M1A9XX0 Chronic gout, unspecified, without tophus (tophi): Secondary | ICD-10-CM | POA: Diagnosis present

## 2018-06-20 DIAGNOSIS — E875 Hyperkalemia: Secondary | ICD-10-CM | POA: Diagnosis present

## 2018-06-20 DIAGNOSIS — D509 Iron deficiency anemia, unspecified: Secondary | ICD-10-CM | POA: Diagnosis present

## 2018-06-20 DIAGNOSIS — R011 Cardiac murmur, unspecified: Secondary | ICD-10-CM | POA: Diagnosis present

## 2018-06-20 DIAGNOSIS — Z6831 Body mass index (BMI) 31.0-31.9, adult: Secondary | ICD-10-CM

## 2018-06-20 DIAGNOSIS — E119 Type 2 diabetes mellitus without complications: Secondary | ICD-10-CM

## 2018-06-20 DIAGNOSIS — N186 End stage renal disease: Secondary | ICD-10-CM | POA: Diagnosis present

## 2018-06-20 DIAGNOSIS — I120 Hypertensive chronic kidney disease with stage 5 chronic kidney disease or end stage renal disease: Secondary | ICD-10-CM

## 2018-06-20 DIAGNOSIS — R5383 Other fatigue: Secondary | ICD-10-CM | POA: Insufficient documentation

## 2018-06-20 DIAGNOSIS — D6832 Hemorrhagic disorder due to extrinsic circulating anticoagulants: Secondary | ICD-10-CM | POA: Diagnosis not present

## 2018-06-20 DIAGNOSIS — K219 Gastro-esophageal reflux disease without esophagitis: Secondary | ICD-10-CM | POA: Diagnosis present

## 2018-06-20 DIAGNOSIS — R791 Abnormal coagulation profile: Secondary | ICD-10-CM

## 2018-06-20 DIAGNOSIS — E1121 Type 2 diabetes mellitus with diabetic nephropathy: Secondary | ICD-10-CM

## 2018-06-20 DIAGNOSIS — K921 Melena: Secondary | ICD-10-CM | POA: Diagnosis present

## 2018-06-20 DIAGNOSIS — I132 Hypertensive heart and chronic kidney disease with heart failure and with stage 5 chronic kidney disease, or end stage renal disease: Secondary | ICD-10-CM

## 2018-06-20 DIAGNOSIS — Z8673 Personal history of transient ischemic attack (TIA), and cerebral infarction without residual deficits: Secondary | ICD-10-CM | POA: Insufficient documentation

## 2018-06-20 DIAGNOSIS — E861 Hypovolemia: Secondary | ICD-10-CM

## 2018-06-20 DIAGNOSIS — Z79899 Other long term (current) drug therapy: Secondary | ICD-10-CM

## 2018-06-20 DIAGNOSIS — I482 Chronic atrial fibrillation, unspecified: Secondary | ICD-10-CM

## 2018-06-20 DIAGNOSIS — K625 Hemorrhage of anus and rectum: Secondary | ICD-10-CM | POA: Diagnosis not present

## 2018-06-20 DIAGNOSIS — I2510 Atherosclerotic heart disease of native coronary artery without angina pectoris: Secondary | ICD-10-CM | POA: Diagnosis present

## 2018-06-20 DIAGNOSIS — H5461 Unqualified visual loss, right eye, normal vision left eye: Secondary | ICD-10-CM | POA: Diagnosis present

## 2018-06-20 DIAGNOSIS — K922 Gastrointestinal hemorrhage, unspecified: Secondary | ICD-10-CM | POA: Diagnosis present

## 2018-06-20 DIAGNOSIS — I420 Dilated cardiomyopathy: Secondary | ICD-10-CM | POA: Diagnosis not present

## 2018-06-20 DIAGNOSIS — E1122 Type 2 diabetes mellitus with diabetic chronic kidney disease: Secondary | ICD-10-CM | POA: Diagnosis present

## 2018-06-20 DIAGNOSIS — I959 Hypotension, unspecified: Secondary | ICD-10-CM | POA: Diagnosis present

## 2018-06-20 DIAGNOSIS — N2581 Secondary hyperparathyroidism of renal origin: Secondary | ICD-10-CM | POA: Diagnosis not present

## 2018-06-20 DIAGNOSIS — R58 Hemorrhage, not elsewhere classified: Secondary | ICD-10-CM

## 2018-06-20 DIAGNOSIS — Z9049 Acquired absence of other specified parts of digestive tract: Secondary | ICD-10-CM

## 2018-06-20 DIAGNOSIS — Z841 Family history of disorders of kidney and ureter: Secondary | ICD-10-CM

## 2018-06-20 DIAGNOSIS — Z8249 Family history of ischemic heart disease and other diseases of the circulatory system: Secondary | ICD-10-CM

## 2018-06-20 DIAGNOSIS — T45511A Poisoning by anticoagulants, accidental (unintentional), initial encounter: Principal | ICD-10-CM | POA: Diagnosis present

## 2018-06-20 DIAGNOSIS — Y69 Unspecified misadventure during surgical and medical care: Secondary | ICD-10-CM

## 2018-06-20 DIAGNOSIS — I509 Heart failure, unspecified: Secondary | ICD-10-CM | POA: Diagnosis present

## 2018-06-20 DIAGNOSIS — I9589 Other hypotension: Secondary | ICD-10-CM

## 2018-06-20 DIAGNOSIS — Z888 Allergy status to other drugs, medicaments and biological substances status: Secondary | ICD-10-CM

## 2018-06-20 DIAGNOSIS — Z85528 Personal history of other malignant neoplasm of kidney: Secondary | ICD-10-CM

## 2018-06-20 DIAGNOSIS — E669 Obesity, unspecified: Secondary | ICD-10-CM | POA: Diagnosis present

## 2018-06-20 LAB — PROTIME-INR
INR: 5.35
Prothrombin Time: 48.1 seconds — ABNORMAL HIGH (ref 11.4–15.2)

## 2018-06-20 LAB — CBC WITH DIFFERENTIAL/PLATELET
Abs Immature Granulocytes: 0.01 10*3/uL (ref 0.00–0.07)
Basophils Absolute: 0 10*3/uL (ref 0.0–0.1)
Basophils Relative: 1 %
Eosinophils Absolute: 0.3 10*3/uL (ref 0.0–0.5)
Eosinophils Relative: 5 %
HCT: 35.6 % — ABNORMAL LOW (ref 39.0–52.0)
Hemoglobin: 10.8 g/dL — ABNORMAL LOW (ref 13.0–17.0)
Immature Granulocytes: 0 %
Lymphocytes Relative: 22 %
Lymphs Abs: 1.2 10*3/uL (ref 0.7–4.0)
MCH: 30.2 pg (ref 26.0–34.0)
MCHC: 30.3 g/dL (ref 30.0–36.0)
MCV: 99.4 fL (ref 80.0–100.0)
Monocytes Absolute: 0.7 10*3/uL (ref 0.1–1.0)
Monocytes Relative: 13 %
Neutro Abs: 3.1 10*3/uL (ref 1.7–7.7)
Neutrophils Relative %: 59 %
Platelets: 199 10*3/uL (ref 150–400)
RBC: 3.58 MIL/uL — ABNORMAL LOW (ref 4.22–5.81)
RDW: 18.4 % — ABNORMAL HIGH (ref 11.5–15.5)
WBC: 5.4 10*3/uL (ref 4.0–10.5)
nRBC: 0 % (ref 0.0–0.2)

## 2018-06-20 LAB — BASIC METABOLIC PANEL
Anion gap: 14 (ref 5–15)
BUN: 44 mg/dL — ABNORMAL HIGH (ref 6–20)
CO2: 28 mmol/L (ref 22–32)
Calcium: 8.3 mg/dL — ABNORMAL LOW (ref 8.9–10.3)
Chloride: 95 mmol/L — ABNORMAL LOW (ref 98–111)
Creatinine, Ser: 10.61 mg/dL — ABNORMAL HIGH (ref 0.61–1.24)
GFR calc Af Amer: 6 mL/min — ABNORMAL LOW (ref >60)
GFR calc non Af Amer: 5 mL/min — ABNORMAL LOW (ref >60)
Glucose, Bld: 74 mg/dL (ref 70–99)
Potassium: 4.7 mmol/L (ref 3.5–5.1)
Sodium: 137 mmol/L (ref 135–145)

## 2018-06-20 LAB — APTT: aPTT: 87 seconds — ABNORMAL HIGH (ref 24–36)

## 2018-06-20 MED ORDER — ZOLPIDEM TARTRATE 5 MG PO TABS
5.0000 mg | ORAL_TABLET | Freq: Every evening | ORAL | Status: DC | PRN
  Administered 2018-06-20 – 2018-06-21 (×2): 5 mg via ORAL
  Filled 2018-06-20 (×2): qty 1

## 2018-06-20 MED ORDER — LIDOCAINE-PRILOCAINE 2.5-2.5 % EX CREA: 1.0000 "application " | TOPICAL_CREAM | CUTANEOUS | Status: DC | PRN

## 2018-06-20 MED ORDER — MIDODRINE HCL 5 MG PO TABS
10.0000 mg | ORAL_TABLET | ORAL | Status: DC
  Administered 2018-06-21: 10 mg via ORAL

## 2018-06-20 MED ORDER — PHYTONADIONE 5 MG PO TABS
2.5000 mg | ORAL_TABLET | Freq: Once | ORAL | Status: AC
  Administered 2018-06-20: 2.5 mg via ORAL
  Filled 2018-06-20: qty 1

## 2018-06-20 MED ORDER — AMIODARONE HCL 100 MG PO TABS
100.0000 mg | ORAL_TABLET | Freq: Every day | ORAL | Status: DC
  Administered 2018-06-21 – 2018-06-22 (×2): 100 mg via ORAL
  Filled 2018-06-20 (×2): qty 1

## 2018-06-20 MED ORDER — MIDODRINE HCL 5 MG PO TABS
5.0000 mg | ORAL_TABLET | ORAL | Status: DC
  Administered 2018-06-22: 5 mg via ORAL
  Filled 2018-06-20 (×2): qty 1

## 2018-06-20 NOTE — ED Notes (Signed)
Has had graft bleeding since last pm  dressing in place at present  Do bleeding through dressing

## 2018-06-20 NOTE — ED Triage Notes (Signed)
Bleeding from his dialysis fistula in his left arm since last night. Dressing is blood free at this time.

## 2018-06-20 NOTE — Progress Notes (Signed)
Subjective:     Patient ID: [name] , male    DOB: July 07, 1960 , 58 y.o.   MRN: [id number]   Chief Complaint  Patient presents with  . Establish Care    Pt wants to establish pcp  . Wound Check    pt states his dialysis site wont stop bleeding    HPI Pt is here to establish care with our practice. Used to see Dr Ellin Mayhew who only works 1 day a week and is hard to get in to see him. Has Hx of DM diet controlled, hypotension, Afib, end stage renal failure, GERD, chronic gout, hyperlipidemia.   Sees his cardiologist q 1-2 months, depending on the INR level.  Has dialysis M,W, F. Has labs done every Friday.  His glucose runs 90's. And he has not been on  Medications for a while due to having controlled glucose.  Has hypotension and his cardiologist placed him on Metadrine on days he has dialysis, but still struggles with low BP.   His L arm where he had the puncture for dialysis has been bleeding all night and EMP placed pressure dressing and still has not helped. Was told to go to ER by the dialysis clinic and he plans to go there from here.    Past Medical History:  Diagnosis Date  . Atrial flutter ([place])   . 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 Cardiomyopathy   . CHF (congestive heart failure) ([place])   . Coronary artery disease    normal coronaries by 10/10/08 cath, Cardiac Cath 08-04-12 epic.Dr. Doylene Canard follows  . Diabetes mellitus    pt. states he's borderline diabetic., no longer taking med,- off med. since 2013  . Dialysis patient [facility])    Mon-Wed-Fri([place])- Left AV fistula  . Diverticulitis November 2016   reoccurred in December 2016  . ESRD (end stage renal disease) ([place])   . GERD (gastroesophageal reflux disease)   . Gout   . History of nephrectomy 07/04/2012   History of right nephrectomy in 2002 for renal cell carcinoma   . History of unilateral nephrectomy   . Hypertension   . Low iron   . Myocardial infarction [facility])  ?2006  . Renal cell carcinoma    dialysis- MWF- Dr. Mercy Moore follows.  . Renal insufficiency   . Shortness of breath 05/19/11   "at rest, lying down, w/exertion"  . Stroke [facility]) 02/2011   05/19/11 denies residual  . Umbilical hernia 42/35/36   unrepaired     Family History  Problem Relation Age of Onset  . Hypertension Mother   . Kidney disease Mother      Current Outpatient Medications:  .  allopurinol (ZYLOPRIM) 300 MG tablet, Take 150 mg by mouth every other day., Disp: , Rfl:  .  Amino Acids-Protein Hydrolys (FEEDING SUPPLEMENT, PRO-STAT SUGAR FREE 64,) LIQD, Take 30 mLs by mouth 2 (two) times daily., Disp: 900 mL, Rfl: 0 .  amiodarone (PACERONE) 200 MG tablet, Take 0.5 tablets (100 mg total) by mouth daily., Disp: 30 tablet, Rfl: 1 .  calcium acetate (PHOSLO) 667 MG capsule, Take 667-2,001 mg by mouth See admin instructions. Take 3 capsules with meals and 1 capsule with snacks., Disp: , Rfl:  .  ferric citrate (AURYXIA) 1 GM 210 MG(Fe) tablet, Take 210 mg by mouth 3 (three) times daily with meals. , Disp: , Rfl:  .  isosorbide dinitrate (ISORDIL) 20 MG tablet, Take 20 mg  by mouth 2 (two) times daily., Disp: , Rfl:  .  lidocaine-prilocaine (EMLA) cream, Apply 1 application topically See admin instructions. Apply topically to port access prior to dialysis - Monday, Wednesday, Friday, Disp: , Rfl: 12 .  midodrine (PROAMATINE) 10 MG tablet, Take 0.5-1 tablets (5-10 mg total) by mouth See admin instructions. Take one tablet (10 mg) by mouth before dialysis (Monday, Wednesday, Friday), take 1/2 tablet (5 mg) on non-dialysis days (Sunday, Tuesday, Thursday, Saturday) as needed for diastolic blood pressure <93., Disp: 120 tablet, Rfl: 2 .  mirtazapine (REMERON) 7.5 MG tablet, Take 1 tablet (7.5 mg total) by mouth at bedtime., Disp: 30 tablet, Rfl: 2 .  multivitamin (RENA-VIT) TABS tablet, Take 1 tablet by mouth at bedtime., Disp: , Rfl:  .  nitroGLYCERIN (NITROSTAT) 0.4 MG SL tablet,  Place 0.4 mg under the tongue every 5 (five) minutes as needed for chest pain. , Disp: , Rfl: 0 .  pantoprazole (PROTONIX) 40 MG tablet, Take 40 mg by mouth 2 (two) times daily., Disp: , Rfl:  .  warfarin (COUMADIN) 2.5 MG tablet, Take 5 tablets (12.5 mg total) by mouth daily at 6 PM., Disp: 180 tablet, Rfl: 1   Allergies  Allergen Reactions  . Ace Inhibitors Itching and Cough     Review of Systems  Constitutional: Positive for fatigue. Negative for activity change and appetite change.  Eyes: Positive for visual disturbance.  Respiratory: Negative for chest tightness and shortness of breath.   Cardiovascular: Negative for chest pain, palpitations and leg swelling.       On occasion gets leg swelling.   Gastrointestinal: Negative for constipation and diarrhea.       Denies GED  Musculoskeletal:       Has a sore spot on his R shin off and on, and does not recall an injury.   Skin: Negative for rash.       Bleeding site on L arm.   Neurological: Positive for weakness. Negative for numbness.       Had numbness on R leg last and when he changed positions his sensation came back     Today's Vitals   06/20/18 1039  BP: 100/64  Pulse: 99  Temp: 98.7 F (37.1 C)  TempSrc: Oral  SpO2: 97%  Weight: 239 lb (108.4 kg)  Height: 6\' 1"  (1.854 m)  PainSc: 0-No pain   Body mass index is 31.53 kg/m.   Objective:  Physical Exam Constitutional:      General: He is not in acute distress.    Appearance: He is not toxic-appearing.     Comments: He looks tired and has a grayish skin color  HENT:     Head: Normocephalic.     Right Ear: External ear normal.     Left Ear: External ear normal.     Nose: Nose normal.  Eyes:     General: No scleral icterus.    Conjunctiva/sclera: Conjunctivae normal.  Neck:     Musculoskeletal: Neck supple. No muscular tenderness.     Vascular: No carotid bruit.  Cardiovascular:     Pulses: Normal pulses.     Heart sounds: No murmur.     Comments: Has  slightly irregular rhythm Pulmonary:     Effort: Pulmonary effort is normal.     Breath sounds: Normal breath sounds.  Musculoskeletal: Normal range of motion.     Comments: AV fistula on L forearm. Has a couple of vein bulges that are soft on his L upper arm  with a trill palpation. I believe this is related to his AV fistula. Is not hot or red. Has mild tenderness.   Lymphadenopathy:     Cervical: No cervical adenopathy.  Skin:    General: Skin is warm and dry.     Comments: Has slow persistent bleeding of a puncture site over his AV fistula. I placed antibiotic ointment and pressure dressing when he arrived here.   Neurological:     Mental Status: He is alert and oriented to person, place, and time.     Gait: Gait normal.  Psychiatric:        Mood and Affect: Mood normal.        Behavior: Behavior normal.        Thought Content: Thought content normal.        Judgment: Judgment normal.    Constitutional: She is oriented to person, place, and time. She appears well-developed and well-nourished. No distress.  HENT:  Head: Normocephalic and atraumatic.  Right Ear: External ear normal.  Left Ear: External ear normal.  Nose: Nose normal.  Eyes: Conjunctivae are normal. Right eye exhibits no discharge. Left eye exhibits no discharge. No scleral icterus.  Neck: Neck supple. No thyromegaly present.  No carotid bruits bilaterally  Cardiovascular: Normal rate and regular rhythm.  No murmur heard. Pulmonary/Chest: Effort normal and breath sounds normal. No respiratory distress.  Musculoskeletal: Normal range of motion. She exhibits no edema.  Lymphadenopathy:    She has no cervical adenopathy.  Neurological: She is alert and oriented to person, place, and time.  Skin: Skin is warm and dry. Capillary refill takes less than 2 seconds. No rash noted. She is not diaphoretic.  Psychiatric: She has a normal mood and affect. Her behavior is normal. Judgment and thought content normal.  Nursing  note reviewed. Assessment And Plan:    1. Controlled type 2 diabetes mellitus with diabetic nephropathy, without long-term current use of insulin ([place])- stable, no action.  - Ambulatory referral to Ophthalmology - Hemoglobin A1c - Lipid Profile  2. Pure hypercholesterolemia- unknown status - Lipid Profile - TSH - T3, free  3. Chronic atrial fibrillation- chromic. No action  4. End stage renal failure on dialysis [facility])- chronic. No action.   5. Hypotension due to hypovolemia- chronic. No action  6. Bleeding- L arm from dialysis site. I placed a pressure dressing with [place] and when he left, there was no bleeding through the dressing, but he felt this area was getting wet and cold. He will go to ER.   FU in 1 month for physical.      Aaryn Parrilla RODRIGUEZ-SOUTHWORTH, PA-C

## 2018-06-20 NOTE — Discharge Instructions (Signed)
Await a call from bed placement at home for your admission.  Please proceed to [facility] at that time.  If you develop increasing bleeding, significant lightheadedness, or any other concerning symptoms before that time, please go immediately to [facility] emergency department.

## 2018-06-20 NOTE — Patient Instructions (Addendum)
Great meeting you today, I will see you next month for your physical.

## 2018-06-20 NOTE — H&P (Signed)
Referring Physician:  [name] is an 58 y.o. male.                       Chief Complaint: Rectal bleed  HPI: 58 year old male with PMH of ESRD, Atrial flutter, Cardiomyopathy, CAD and type 2 DM took extra dose of 10 mg. Warfarin for 1-2 days. Yesterday he noticed blood mixed with stools. He had blood oozing from hemodialysis AV graft puncture site which is controlled with pressure dressing. He denies chest pain or dizziness bur feels weak. He has stable Hgb level.  Past Medical History:  Diagnosis Date  . Atrial flutter ([place])   . 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 Cardiomyopathy   . CHF (congestive heart failure) ([place])   . Coronary artery disease    normal coronaries by 10/10/08 cath, Cardiac Cath 08-04-12 epic.Dr. Doylene Canard follows  . Diabetes mellitus    pt. states he's borderline diabetic., no longer taking med,- off med. since 2013  . Dialysis patient [facility])    Mon-Wed-Fri([place])- Left AV fistula  . Diverticulitis November 2016   reoccurred in December 2016  . ESRD (end stage renal disease) ([place])   . GERD (gastroesophageal reflux disease)   . Gout   . History of nephrectomy 07/04/2012   History of right nephrectomy in 2002 for renal cell carcinoma   . History of unilateral nephrectomy   . Hypertension   . Low iron   . Myocardial infarction [facility]) ?2006  . Renal cell carcinoma    dialysis- MWF- Dr. Mercy Moore follows.  . Renal insufficiency   . Shortness of breath 05/19/11   "at rest, lying down, w/exertion"  . Stroke [facility]) 02/2011   05/19/11 denies residual  . Umbilical hernia 84/16/60   unrepaired      Past Surgical History:  Procedure Laterality Date  . AV FISTULA PLACEMENT  03/29/2011   Procedure: ARTERIOVENOUS (AV) FISTULA CREATION;  Surgeon: Hinda Lenis, MD;  Location: [place];  Service: Vascular;  Laterality: Left;  LEFT RADIOCEPHALIC , Arteriovenous [id number])  . CARDIAC CATHETERIZATION  05/21/11  . CARDIAC  CATHETERIZATION N/A 03/16/2016   Procedure: Left Heart Cath and Coronary Angiography;  Surgeon: Dixie Dials, MD;  Location: [place] CV LAB;  Service: Cardiovascular;  Laterality: N/A;  . CHOLECYSTECTOMY N/A 02/12/2016   Procedure: LAPAROSCOPIC CHOLECYSTECTOMY WITH INTRAOPERATIVE CHOLANGIOGRAM;  Surgeon: Jackolyn Confer, MD;  Location: [place];  Service: General;  Laterality: N/A;  . COLONOSCOPY WITH PROPOFOL N/A 02/01/2017   Procedure: COLONOSCOPY WITH PROPOFOL;  Surgeon: Juanita Craver, MD;  Location: [facility];  Service: Endoscopy;  Laterality: N/A;  . dialysis cath placed    . ESOPHAGOGASTRODUODENOSCOPY (EGD) WITH PROPOFOL N/A 12/02/2015   Procedure: ESOPHAGOGASTRODUODENOSCOPY (EGD) WITH PROPOFOL;  Surgeon: Juanita Craver, MD;  Location: [facility];  Service: Endoscopy;  Laterality: N/A;  . FINGER SURGERY     L pinkie finger- ORIF- /w remaining hardware - 1990's    . HERNIA REPAIR N/A 5/57/3220   Umbilical hernia repair  . INSERTION OF DIALYSIS CATHETER N/A 01/27/2016   Procedure: INSERTION OF DIALYSIS CATHETER;  Surgeon: Waynetta Sandy, MD;  Location: [place];  Service: Vascular;  Laterality: N/A;  . INSERTION OF MESH N/A 07/04/2012   Procedure: INSERTION OF MESH;  Surgeon: Madilyn Hook, DO;  Location: [place];  Service: General;  Laterality: N/A;  . IR EMBO ART  VEN HEMORR LYMPH EXTRAV  INC GUIDE ROADMAPPING  01/13/2018  .  IR FLUORO GUIDE CV LINE RIGHT  01/13/2018  . IR IVC FILTER PLMT / S&I /IMG GUID/MOD SED  01/27/2018  . IR RENAL SELECTIVE  UNI INC S&I MOD SED  01/13/2018  . IR US GUIDE VASC ACCESS RIGHT  01/13/2018  . LAPAROSCOPIC LYSIS OF ADHESIONS N/A 02/12/2016   Procedure: LAPAROSCOPIC LYSIS OF ADHESIONS;  Surgeon: Jackolyn Confer, MD;  Location: [place];  Service: General;  Laterality: N/A;  . LEFT AND RIGHT HEART CATHETERIZATION WITH CORONARY ANGIOGRAM N/A 08/04/2012   Procedure: LEFT AND RIGHT HEART CATHETERIZATION WITH CORONARY ANGIOGRAM;  Surgeon: Birdie Riddle, MD;  Location: [place]  CATH LAB;  Service: Cardiovascular;  Laterality: N/A;  . NEPHRECTOMY  2000   right  . REVISON OF ARTERIOVENOUS FISTULA Left 06/05/2013   Procedure: REVISON OF LEFT RADIOCEPHALIC  ARTERIOVENOUS FISTULA;  Surgeon: Conrad Firth, MD;  Location: [place];  Service: Vascular;  Laterality: Left;  . REVISON OF ARTERIOVENOUS FISTULA Left 01/27/2016   Procedure: REVISION OF LEFT UPPER EXTREMITY ARTERIOVENOUS FISTULA;  Surgeon: Waynetta Sandy, MD;  Location: [place];  Service: Vascular;  Laterality: Left;  . REVISON OF ARTERIOVENOUS FISTULA Left 08/03/2016   Procedure: REVISON OF Left arm ARTERIOVENOUS FISTULA;  Surgeon: Elam Dutch, MD;  Location: [place];  Service: Vascular;  Laterality: Left;  . REVISON OF ARTERIOVENOUS FISTULA Left 05/06/2017   Procedure: REVISION PLICATION OF ARTERIOVENOUS FISTULA LEFT ARM;  Surgeon: Rosetta Posner, MD;  Location: [place];  Service: Vascular;  Laterality: Left;  . RIGHT HEART CATHETERIZATION N/A 05/21/2011   Procedure: RIGHT HEART CATH;  Surgeon: Birdie Riddle, MD;  Location: [facility] CATH LAB;  Service: Cardiovascular;  Laterality: N/A;  . smashed  1990's   "left pinky; have a plate in there"  . UMBILICAL HERNIA REPAIR N/A 07/04/2012   Procedure: LAPAROSCOPIC UMBILICAL HERNIA;  Surgeon: Madilyn Hook, DO;  Location: [place];  Service: General;  Laterality: N/A;  . US ECHOCARDIOGRAPHY  05/20/11    Family History  Problem Relation Age of Onset  . Hypertension Mother   . Kidney disease Mother    Social History:  reports that he has never smoked. He has never used smokeless tobacco. He reports that he does not drink alcohol or use drugs.  Allergies:  Allergies  Allergen Reactions  . Ace Inhibitors Itching and Cough    Medications Prior to Admission  Medication Sig Dispense Refill  . allopurinol (ZYLOPRIM) 300 MG tablet Take 150 mg by mouth every other day.    . Amino Acids-Protein Hydrolys (FEEDING SUPPLEMENT, PRO-STAT SUGAR FREE 64,) LIQD Take 30 mLs by mouth 2  (two) times daily. 900 mL 0  . amiodarone (PACERONE) 200 MG tablet Take 0.5 tablets (100 mg total) by mouth daily. 30 tablet 1  . calcium acetate (PHOSLO) 667 MG capsule Take 667-2,001 mg by mouth See admin instructions. Take 3 capsules with meals and 1 capsule with snacks.    . ferric citrate (AURYXIA) 1 GM 210 MG(Fe) tablet Take 210 mg by mouth 3 (three) times daily with meals.     . isosorbide dinitrate (ISORDIL) 20 MG tablet Take 20 mg by mouth 2 (two) times daily.    Marland Kitchen lidocaine-prilocaine (EMLA) cream Apply 1 application topically See admin instructions. Apply topically to port access prior to dialysis - Monday, Wednesday, Friday  12  . midodrine (PROAMATINE) 10 MG tablet Take 0.5-1 tablets (5-10 mg total) by mouth See admin instructions. Take one tablet (10 mg) by mouth before dialysis (Monday, Wednesday, Friday),  take 1/2 tablet (5 mg) on non-dialysis days (Sunday, Tuesday, Thursday, Saturday) as needed for diastolic blood pressure <87. 120 tablet 2  . mirtazapine (REMERON) 7.5 MG tablet Take 1 tablet (7.5 mg total) by mouth at bedtime. 30 tablet 2  . multivitamin (RENA-VIT) TABS tablet Take 1 tablet by mouth at bedtime.    . nitroGLYCERIN (NITROSTAT) 0.4 MG SL tablet Place 0.4 mg under the tongue every 5 (five) minutes as needed for chest pain.   0  . pantoprazole (PROTONIX) 40 MG tablet Take 40 mg by mouth 2 (two) times daily.    Marland Kitchen warfarin (COUMADIN) 2.5 MG tablet Take 5 tablets (12.5 mg total) by mouth daily at 6 PM. 180 tablet 1    Results for orders placed or performed during the hospital encounter of 06/20/18 (from the past 48 hour(s))  Protime-INR     Status: Abnormal   Collection Time: 06/20/18  1:19 PM  Result Value Ref Range   Prothrombin Time 48.1 (H) 11.4 - 15.2 seconds    Comment: REPEATED TO VERIFY   INR 5.35 (HH)     Comment: REPEATED TO VERIFY CRITICAL RESULT CALLED TO, READ BACK BY AND VERIFIED WITH: AMY HARTLEY,RN AT 1347 06/20/2018 BY K BARR Performed at [facility], [address] [place], [place] [address]   APTT     Status: Abnormal   Collection Time: 06/20/18  1:19 PM  Result Value Ref Range   aPTT 87 (H) 24 - 36 seconds    Comment:        IF BASELINE aPTT IS ELEVATED, SUGGEST PATIENT RISK ASSESSMENT BE USED TO DETERMINE APPROPRIATE ANTICOAGULANT THERAPY. Performed at [facility], [address] [place], [place] [address]   Basic metabolic panel     Status: Abnormal   Collection Time: 06/20/18  2:27 PM  Result Value Ref Range   Sodium 137 135 - 145 mmol/L   Potassium 4.7 3.5 - 5.1 mmol/L   Chloride 95 (L) 98 - 111 mmol/L   CO2 28 22 - 32 mmol/L   Glucose, Bld 74 70 - 99 mg/dL   BUN 44 (H) 6 - 20 mg/dL   Creatinine, Ser 10.61 (H) 0.61 - 1.24 mg/dL   Calcium 8.3 (L) 8.9 - 10.3 mg/dL   GFR calc non Af Amer 5 (L) >60 mL/min   GFR calc Af Amer 6 (L) >60 mL/min   Anion gap 14 5 - 15    Comment: Performed at [facility], [address] [place], [place] [address]  CBC with Differential     Status: Abnormal   Collection Time: 06/20/18  2:27 PM  Result Value Ref Range   WBC 5.4 4.0 - 10.5 K/uL   RBC 3.58 (L) 4.22 - 5.81 MIL/uL   Hemoglobin 10.8 (L) 13.0 - 17.0 g/dL   HCT 35.6 (L) 39.0 - 52.0 %   MCV 99.4 80.0 - 100.0 fL   MCH 30.2 26.0 - 34.0 pg   MCHC 30.3 30.0 - 36.0 g/dL   RDW 18.4 (H) 11.5 - 15.5 %   Platelets 199 150 - 400 K/uL   nRBC 0.0 0.0 - 0.2 %   Neutrophils Relative % 59 %   Neutro Abs 3.1 1.7 - 7.7 K/uL   Lymphocytes Relative 22 %   Lymphs Abs 1.2 0.7 - 4.0 K/uL   Monocytes Relative 13 %   Monocytes Absolute 0.7 0.1 - 1.0 K/uL   Eosinophils Relative 5 %  Eosinophils Absolute 0.3 0.0 - 0.5 K/uL   Basophils Relative 1 %   Basophils Absolute 0.0 0.0 - 0.1 K/uL   Immature Granulocytes 0 %   Abs Immature Granulocytes 0.01 0.00 - 0.07 K/uL    Comment: Performed at [facility], [address] [place], [place] [address]   No results found.  Review Of  Systems Constitutional: No fever, chills, weight loss or gain. Eyes: No vision change, wears glasses. No discharge or pain. Ears: No hearing loss, No tinnitus. Respiratory: No asthma, COPD, pneumonias. No shortness of breath. No hemoptysis. Cardiovascular: Positive chest pain, palpitation, leg edema. Gastrointestinal: No nausea, vomiting, diarrhea, constipation. Positive GI bleed. No hepatitis. Genitourinary: No dysuria, hematuria, kidney stone. No incontinance. Neurological: No headache, stroke, seizures.  Psychiatry: No psych facility admission for anxiety, depression, suicide. No detox. Skin: No rash. Musculoskeletal: Positive joint pain, fibromyalgia. No neck pain, back pain. Lymphadenopathy: No lymphadenopathy. Hematology: Positive anemia or easy bruising.   Blood pressure 96/74, pulse (!) 107, temperature 98.5 F (36.9 C), temperature source Oral, resp. rate 18, height 6\' 1"  (1.854 m), weight 109.8 kg, SpO2 100 %. Body mass index is 31.94 kg/m. General appearance: alert, cooperative, appears stated age and no distress Head: Normocephalic, atraumatic. Eyes: Brown eyes, pale pink conjunctiva, corneas clear. PERRL, EOM's intact. Neck: No adenopathy, no carotid bruit, no JVD, supple, symmetrical, trachea midline and thyroid not enlarged. Resp: Clear to auscultation bilaterally. Cardio: Regular rate and rhythm, S1, S2 normal, II/VI systolic murmur, no click, rub or gallop GI: Soft, non-tender; bowel sounds normal; no organomegaly. Extremities: No edema, cyanosis or clubbing. Left forearm AV fistula with heavy dressing. Skin: Warm and dry.  Neurologic: Alert and oriented X 3, normal strength. Normal coordination and gait.  Assessment/Plan Acute rectal bleed, due to supra therapeutic INR Hypotension ESRD Type 2 DM Obesity Anemia of chronic disease Paroxysmal atrial flutter Dilated cardiomyopathy  Place in observation. Small dose vitamin K.  Birdie Riddle, MD  06/20/2018,  8:19 PM

## 2018-06-21 DIAGNOSIS — K219 Gastro-esophageal reflux disease without esophagitis: Secondary | ICD-10-CM | POA: Diagnosis present

## 2018-06-21 DIAGNOSIS — I959 Hypotension, unspecified: Secondary | ICD-10-CM | POA: Diagnosis present

## 2018-06-21 DIAGNOSIS — K922 Gastrointestinal hemorrhage, unspecified: Secondary | ICD-10-CM | POA: Diagnosis present

## 2018-06-21 DIAGNOSIS — I252 Old myocardial infarction: Secondary | ICD-10-CM | POA: Diagnosis not present

## 2018-06-21 DIAGNOSIS — K921 Melena: Secondary | ICD-10-CM | POA: Diagnosis present

## 2018-06-21 DIAGNOSIS — D509 Iron deficiency anemia, unspecified: Secondary | ICD-10-CM | POA: Diagnosis present

## 2018-06-21 DIAGNOSIS — I2510 Atherosclerotic heart disease of native coronary artery without angina pectoris: Secondary | ICD-10-CM | POA: Diagnosis present

## 2018-06-21 DIAGNOSIS — R011 Cardiac murmur, unspecified: Secondary | ICD-10-CM | POA: Diagnosis present

## 2018-06-21 DIAGNOSIS — H5461 Unqualified visual loss, right eye, normal vision left eye: Secondary | ICD-10-CM | POA: Diagnosis present

## 2018-06-21 DIAGNOSIS — Z6831 Body mass index (BMI) 31.0-31.9, adult: Secondary | ICD-10-CM | POA: Diagnosis not present

## 2018-06-21 DIAGNOSIS — Z905 Acquired absence of kidney: Secondary | ICD-10-CM | POA: Diagnosis not present

## 2018-06-21 DIAGNOSIS — D631 Anemia in chronic kidney disease: Secondary | ICD-10-CM | POA: Diagnosis not present

## 2018-06-21 DIAGNOSIS — D6832 Hemorrhagic disorder due to extrinsic circulating anticoagulants: Secondary | ICD-10-CM | POA: Diagnosis present

## 2018-06-21 DIAGNOSIS — E1122 Type 2 diabetes mellitus with diabetic chronic kidney disease: Secondary | ICD-10-CM | POA: Diagnosis present

## 2018-06-21 DIAGNOSIS — Z7901 Long term (current) use of anticoagulants: Secondary | ICD-10-CM | POA: Diagnosis not present

## 2018-06-21 DIAGNOSIS — Z992 Dependence on renal dialysis: Secondary | ICD-10-CM | POA: Diagnosis not present

## 2018-06-21 DIAGNOSIS — M1A9XX0 Chronic gout, unspecified, without tophus (tophi): Secondary | ICD-10-CM | POA: Diagnosis present

## 2018-06-21 DIAGNOSIS — N2581 Secondary hyperparathyroidism of renal origin: Secondary | ICD-10-CM | POA: Diagnosis present

## 2018-06-21 DIAGNOSIS — N186 End stage renal disease: Secondary | ICD-10-CM | POA: Diagnosis not present

## 2018-06-21 DIAGNOSIS — I420 Dilated cardiomyopathy: Secondary | ICD-10-CM | POA: Diagnosis present

## 2018-06-21 DIAGNOSIS — I120 Hypertensive chronic kidney disease with stage 5 chronic kidney disease or end stage renal disease: Secondary | ICD-10-CM | POA: Diagnosis not present

## 2018-06-21 DIAGNOSIS — E669 Obesity, unspecified: Secondary | ICD-10-CM | POA: Diagnosis present

## 2018-06-21 DIAGNOSIS — I4892 Unspecified atrial flutter: Secondary | ICD-10-CM | POA: Diagnosis present

## 2018-06-21 DIAGNOSIS — I132 Hypertensive heart and chronic kidney disease with heart failure and with stage 5 chronic kidney disease, or end stage renal disease: Secondary | ICD-10-CM | POA: Diagnosis present

## 2018-06-21 DIAGNOSIS — T45511A Poisoning by anticoagulants, accidental (unintentional), initial encounter: Secondary | ICD-10-CM | POA: Diagnosis present

## 2018-06-21 DIAGNOSIS — I509 Heart failure, unspecified: Secondary | ICD-10-CM | POA: Diagnosis present

## 2018-06-21 DIAGNOSIS — E875 Hyperkalemia: Secondary | ICD-10-CM | POA: Diagnosis present

## 2018-06-21 DIAGNOSIS — D638 Anemia in other chronic diseases classified elsewhere: Secondary | ICD-10-CM | POA: Diagnosis not present

## 2018-06-21 DIAGNOSIS — K625 Hemorrhage of anus and rectum: Secondary | ICD-10-CM | POA: Diagnosis not present

## 2018-06-21 HISTORY — DX: Gastrointestinal hemorrhage, unspecified: K92.2

## 2018-06-21 LAB — BASIC METABOLIC PANEL
Anion gap: 17 — ABNORMAL HIGH (ref 5–15)
BUN: 49 mg/dL — ABNORMAL HIGH (ref 6–20)
CO2: 23 mmol/L (ref 22–32)
Calcium: 8.4 mg/dL — ABNORMAL LOW (ref 8.9–10.3)
Chloride: 98 mmol/L (ref 98–111)
Creatinine, Ser: 12.04 mg/dL — ABNORMAL HIGH (ref 0.61–1.24)
GFR calc Af Amer: 5 mL/min — ABNORMAL LOW (ref >60)
GFR calc non Af Amer: 4 mL/min — ABNORMAL LOW (ref >60)
Glucose, Bld: 95 mg/dL (ref 70–99)
Potassium: 5.6 mmol/L — ABNORMAL HIGH (ref 3.5–5.1)
Sodium: 138 mmol/L (ref 135–145)

## 2018-06-21 LAB — CBC
HCT: 35.1 % — ABNORMAL LOW (ref 39.0–52.0)
Hemoglobin: 11.1 g/dL — ABNORMAL LOW (ref 13.0–17.0)
MCH: 31.3 pg (ref 26.0–34.0)
MCHC: 31.6 g/dL (ref 30.0–36.0)
MCV: 98.9 fL (ref 80.0–100.0)
Platelets: 173 10*3/uL (ref 150–400)
RBC: 3.55 MIL/uL — ABNORMAL LOW (ref 4.22–5.81)
RDW: 18.1 % — ABNORMAL HIGH (ref 11.5–15.5)
WBC: 4.9 10*3/uL (ref 4.0–10.5)
nRBC: 0 % (ref 0.0–0.2)

## 2018-06-21 LAB — MRSA PCR SCREENING: MRSA by PCR: NEGATIVE

## 2018-06-21 LAB — LIPID PANEL
Chol/HDL Ratio: 4.5 ratio (ref 0.0–5.0)
Cholesterol, Total: 213 mg/dL — ABNORMAL HIGH (ref 100–199)
HDL: 47 mg/dL (ref >39)
LDL Calculated: 123 mg/dL — ABNORMAL HIGH (ref 0–99)
Triglycerides: 214 mg/dL — ABNORMAL HIGH (ref 0–149)
VLDL Cholesterol Cal: 43 mg/dL — ABNORMAL HIGH (ref 5–40)

## 2018-06-21 LAB — GLUCOSE, CAPILLARY
Glucose-Capillary: 95 mg/dL (ref 70–99)
Glucose-Capillary: 78 mg/dL (ref 70–99)

## 2018-06-21 LAB — HEMOGLOBIN A1C
Est. average glucose Bld gHb Est-mCnc: 97 mg/dL
Hgb A1c MFr Bld: 5 % (ref 4.8–5.6)
Hgb A1c MFr Bld: 5.6 % (ref 4.8–5.6)
Mean Plasma Glucose: 114.02 mg/dL

## 2018-06-21 LAB — PROTIME-INR
INR: 5.2
Prothrombin Time: 47.1 seconds — ABNORMAL HIGH (ref 11.4–15.2)

## 2018-06-21 LAB — TSH: TSH: 1.27 u[IU]/mL (ref 0.450–4.500)

## 2018-06-21 LAB — T3, FREE: T3, Free: 2.8 pg/mL (ref 2.0–4.4)

## 2018-06-21 MED ORDER — RENA-VITE PO TABS
1.0000 | ORAL_TABLET | Freq: Every day | ORAL | Status: DC
  Administered 2018-06-21: 1 via ORAL
  Filled 2018-06-21: qty 1

## 2018-06-21 MED ORDER — CHLORHEXIDINE GLUCONATE CLOTH 2 % EX PADS: 6.0000 | MEDICATED_PAD | Freq: Every day | CUTANEOUS | Status: DC

## 2018-06-21 MED ORDER — LANTHANUM CARBONATE 500 MG PO CHEW
1000.0000 mg | CHEWABLE_TABLET | Freq: Three times a day (TID) | ORAL | Status: DC
  Administered 2018-06-21 – 2018-06-22 (×2): 1000 mg via ORAL
  Filled 2018-06-21 (×2): qty 2

## 2018-06-21 MED ORDER — DOXERCALCIFEROL 4 MCG/2ML IV SOLN
3.0000 ug | INTRAVENOUS | Status: DC
  Administered 2018-06-21: 3 ug via INTRAVENOUS
  Filled 2018-06-21: qty 2

## 2018-06-21 MED ORDER — DOXERCALCIFEROL 4 MCG/2ML IV SOLN
INTRAVENOUS | Status: AC
  Filled 2018-06-21: qty 2

## 2018-06-21 MED ORDER — FERRIC CITRATE 1 GM 210 MG(FE) PO TABS
420.0000 mg | ORAL_TABLET | Freq: Three times a day (TID) | ORAL | Status: DC
  Administered 2018-06-21 – 2018-06-22 (×2): 420 mg via ORAL
  Filled 2018-06-21 (×3): qty 2

## 2018-06-21 MED ORDER — PHYTONADIONE 5 MG PO TABS
10.0000 mg | ORAL_TABLET | Freq: Once | ORAL | Status: AC
  Administered 2018-06-21: 10 mg via ORAL
  Filled 2018-06-21: qty 2

## 2018-06-21 NOTE — ED Provider Notes (Signed)
[name] Provider Note   CSN: [id number] Arrival date & time: 06/20/18  1221     History   Chief Complaint Chief Complaint  Patient presents with  . Bleeding from Dialysis Fistula    HPI Mancil Pfenning is a 58 y.o. male with history of ESRD on dialysis MWF, CHF, CAD, atrial fibrillation anticoagulated on Coumadin who presents with bleeding from his dialysis fistula.  He finished dialysis around 10 or 11 AM yesterday and it was still oozing when he took the bandage off this morning.  He went to his doctor who sent him here.  Patient denies any pain in his chest or trouble breathing.  After probing questions, as patient did not initially answer, patient did have some blood in his stool over the weekend which has slowed down, however he did see some earlier today.  Patient is feeling fatigued, but denies any lightheadedness or dizziness.  HPI  Past Medical History:  Diagnosis Date  . Atrial flutter ([place])   . 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 Cardiomyopathy   . CHF (congestive heart failure) ([place])   . Coronary artery disease    normal coronaries by 10/10/08 cath, Cardiac Cath 08-04-12 epic.Dr. Doylene Canard follows  . Diabetes mellitus    pt. states he's borderline diabetic., no longer taking med,- off med. since 2013  . Dialysis patient [facility])    Mon-Wed-Fri([place])- Left AV fistula  . Diverticulitis November 2016   reoccurred in December 2016  . ESRD (end stage renal disease) ([place])   . GERD (gastroesophageal reflux disease)   . Gout   . History of nephrectomy 07/04/2012   History of right nephrectomy in 2002 for renal cell carcinoma   . History of unilateral nephrectomy   . Hypertension   . Low iron   . Myocardial infarction [facility]) ?2006  . Renal cell carcinoma    dialysis- MWF- Dr. Mercy Moore follows.  . Renal insufficiency   . Shortness of breath 05/19/11   "at rest, lying down, w/exertion"  . Stroke [facility])  02/2011   05/19/11 denies residual  . Umbilical hernia 12/23/21   unrepaired    Patient Active Problem List   Diagnosis Date Noted  . Supratherapeutic INR 06/20/2018  . Melena 06/20/2018  . Malnutrition of moderate degree 02/10/2018  . Anemia 01/27/2018  . HCAP (healthcare-associated pneumonia) 01/26/2018  . CAD (coronary artery disease) 01/26/2018  . Atrial fibrillation, chronic 01/26/2018  . Depression 01/26/2018  . GERD (gastroesophageal reflux disease) 01/26/2018  . Pulmonary embolism ([place]) 01/26/2018  . History of atrial flutter 01/13/2018  . Hemorrhage of left kidney 01/13/2018  . Acute respiratory failure with hypoxemia ([place]) 07/17/2017  . 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  . Acute on chronic combined systolic and diastolic CHF, NYHA class 4 ([place]) 07/17/2017  . Unresponsiveness 03/16/2017  . Hypotension 03/16/2017  . Paroxysmal atrial flutter ([place]) 03/16/2017  . Chronic pain 03/16/2017  . Leg pain, bilateral 03/16/2017  . Bile leak, postoperative 03/15/2016  . Elevated troponin 03/15/2016  . Rib pain on right side   . Biliary dyskinesia 02/12/2016  . Atrial flutter with rapid ventricular response ([place]) 06/27/2015  . Diverticulitis 04/21/2015  . Acute diverticulitis 04/21/2015  . Diverticulitis of intestine without perforation or abscess without bleeding 04/21/2015  . Pulmonary edema 01/13/2015  . Right upper quadrant abdominal pain 01/13/2015  . Acute respiratory failure with hypoxia ([place]) 01/13/2015  . Diverticulitis large intestine w/o perforation or abscess  w/o bleeding 12/18/2014  . Hepatic cyst 12/18/2014  . Aftercare following surgery of the circulatory system, [place] 06/22/2013  . ESRD (end stage renal disease) on dialysis ([place]) 11/28/2012  . Right sided weakness 10/05/2012  . History of nephrectomy 07/04/2012  . End-stage renal disease on hemodialysis ([place]) 06/04/2011  . Chronic combined systolic (congestive) and diastolic (congestive) heart failure ([place]) 05/19/2011     Class: Acute  . Hypertension, benign 05/19/2011    Class: Chronic  . DM II (diabetes mellitus, type II), controlled ([place]) 05/19/2011    Class: Chronic  . Gout, chronic 05/19/2011    Past Surgical History:  Procedure Laterality Date  . AV FISTULA PLACEMENT  03/29/2011   Procedure: ARTERIOVENOUS (AV) FISTULA CREATION;  Surgeon: Hinda Lenis, MD;  Location: [place];  Service: Vascular;  Laterality: Left;  LEFT RADIOCEPHALIC , Arteriovenous [id number])  . CARDIAC CATHETERIZATION  05/21/11  . CARDIAC CATHETERIZATION N/A 03/16/2016   Procedure: Left Heart Cath and Coronary Angiography;  Surgeon: Dixie Dials, MD;  Location: [place] CV LAB;  Service: Cardiovascular;  Laterality: N/A;  . CHOLECYSTECTOMY N/A 02/12/2016   Procedure: LAPAROSCOPIC CHOLECYSTECTOMY WITH INTRAOPERATIVE CHOLANGIOGRAM;  Surgeon: Jackolyn Confer, MD;  Location: [place];  Service: General;  Laterality: N/A;  . COLONOSCOPY WITH PROPOFOL N/A 02/01/2017   Procedure: COLONOSCOPY WITH PROPOFOL;  Surgeon: Juanita Craver, MD;  Location: [facility] ENDOS[facility];  Service: Endoscopy;  Laterality: N/A;  . dialysis cath placed    . ESOPHAGOGASTRODUODENOSCOPY (EGD) WITH PROPOFOL N/A 12/02/2015   Procedure: ESOPHAGOGASTRODUODENOSCOPY (EGD) WITH PROPOFOL;  Surgeon: Juanita Craver, MD;  Location: [facility];  Service: Endoscopy;  Laterality: N/A;  . FINGER SURGERY     L pinkie finger- ORIF- /w remaining hardware - 1990's    . HERNIA REPAIR N/A 8/46/9629   Umbilical hernia repair  . INSERTION OF DIALYSIS CATHETER N/A 01/27/2016   Procedure: INSERTION OF DIALYSIS CATHETER;  Surgeon: Waynetta Sandy, MD;  Location: [place];  Service: Vascular;  Laterality: N/A;  . INSERTION OF MESH N/A 07/04/2012   Procedure: INSERTION OF MESH;  Surgeon: Madilyn Hook, DO;  Location: [place];  Service: General;  Laterality: N/A;  . IR EMBO ART  VEN HEMORR LYMPH EXTRAV  INC GUIDE ROADMAPPING  01/13/2018  . IR FLUORO GUIDE CV LINE RIGHT  01/13/2018  . IR IVC  FILTER PLMT / S&I /IMG GUID/MOD SED  01/27/2018  . IR RENAL SELECTIVE  UNI INC S&I MOD SED  01/13/2018  . IR US GUIDE VASC ACCESS RIGHT  01/13/2018  . LAPAROSCOPIC LYSIS OF ADHESIONS N/A 02/12/2016   Procedure: LAPAROSCOPIC LYSIS OF ADHESIONS;  Surgeon: Jackolyn Confer, MD;  Location: [place];  Service: General;  Laterality: N/A;  . LEFT AND RIGHT HEART CATHETERIZATION WITH CORONARY ANGIOGRAM N/A 08/04/2012   Procedure: LEFT AND RIGHT HEART CATHETERIZATION WITH CORONARY ANGIOGRAM;  Surgeon: Birdie Riddle, MD;  Location: [place] CATH LAB;  Service: Cardiovascular;  Laterality: N/A;  . NEPHRECTOMY  2000   right  . REVISON OF ARTERIOVENOUS FISTULA Left 06/05/2013   Procedure: REVISON OF LEFT RADIOCEPHALIC  ARTERIOVENOUS FISTULA;  Surgeon: Conrad Bond, MD;  Location: [place];  Service: Vascular;  Laterality: Left;  . REVISON OF ARTERIOVENOUS FISTULA Left 01/27/2016   Procedure: REVISION OF LEFT UPPER EXTREMITY ARTERIOVENOUS FISTULA;  Surgeon: Waynetta Sandy, MD;  Location: [place];  Service: Vascular;  Laterality: Left;  . REVISON OF ARTERIOVENOUS FISTULA Left 08/03/2016   Procedure: REVISON OF Left arm ARTERIOVENOUS FISTULA;  Surgeon: Elam Dutch, MD;  [facility] OR;  Service: Vascular;  Laterality: Left;  . REVISON OF ARTERIOVENOUS FISTULA Left 05/06/2017   Procedure: REVISION PLICATION OF ARTERIOVENOUS FISTULA LEFT ARM;  Surgeon: Rosetta Posner, MD;  Location: [place];  Service: Vascular;  Laterality: Left;  . RIGHT HEART CATHETERIZATION N/A 05/21/2011   Procedure: RIGHT HEART CATH;  Surgeon: Birdie Riddle, MD;  Location: [facility] CATH LAB;  Service: Cardiovascular;  Laterality: N/A;  . smashed  1990's   "left pinky; have a plate in there"  . UMBILICAL HERNIA REPAIR N/A 07/04/2012   Procedure: LAPAROSCOPIC UMBILICAL HERNIA;  Surgeon: Madilyn Hook, DO;  Location: [employer];  Service: General;  Laterality: N/A;  . US ECHOCARDIOGRAPHY  05/20/11        Home Medications    Prior to Admission medications     Medication Sig Start Date End Date Taking? Authorizing Provider  allopurinol (ZYLOPRIM) 300 MG tablet Take 150 mg by mouth every other day.    [provider]  Amino Acids-Protein Hydrolys (FEEDING SUPPLEMENT, PRO-STAT SUGAR FREE 64,) LIQD Take 30 mLs by mouth 2 (two) times daily. 02/11/18   Desiree Hane, MD  amiodarone (PACERONE) 200 MG tablet Take 0.5 tablets (100 mg total) by mouth daily. 07/21/17   Regalado, Belkys A, MD  calcium acetate (PHOSLO) 667 MG capsule Take 667-2,001 mg by mouth See admin instructions. Take 3 capsules with meals and 1 capsule with snacks. 01/24/18   [provider]  ferric citrate (AURYXIA) 1 GM 210 MG(Fe) tablet Take 210 mg by mouth 3 (three) times daily with meals.     [provider]  isosorbide dinitrate (ISORDIL) 20 MG tablet Take 20 mg by mouth 2 (two) times daily.    [provider]  lidocaine-prilocaine (EMLA) cream Apply 1 application topically See admin instructions. Apply topically to port access prior to dialysis - Monday, Wednesday, Friday 04/08/15   [provider]  midodrine (PROAMATINE) 10 MG tablet Take 0.5-1 tablets (5-10 mg total) by mouth See admin instructions. Take one tablet (10 mg) by mouth before dialysis (Monday, Wednesday, Friday), take 1/2 tablet (5 mg) on non-dialysis days (Sunday, Tuesday, Thursday, Saturday) as needed for diastolic blood pressure <57. 01/23/18   Denton Brick, Courage, MD  mirtazapine (REMERON) 7.5 MG tablet Take 1 tablet (7.5 mg total) by mouth at bedtime. 01/23/18   Roxan Hockey, MD  multivitamin (RENA-VIT) TABS tablet Take 1 tablet by mouth at bedtime.    [provider]  nitroGLYCERIN (NITROSTAT) 0.4 MG SL tablet Place 0.4 mg under the tongue every 5 (five) minutes as needed for chest pain.  05/20/15   [provider]  pantoprazole (PROTONIX) 40 MG tablet Take 40 mg by mouth 2 (two) times daily.    [provider]  warfarin (COUMADIN) 2.5 MG tablet Take 5  tablets (12.5 mg total) by mouth daily at 6 PM. 02/11/18   Desiree Hane, MD    Family History Family History  Problem Relation Age of Onset  . Hypertension Mother   . Kidney disease Mother     Social History Social History   Tobacco Use  . Smoking status: Never Smoker  . Smokeless tobacco: Never Used  Substance Use Topics  . Alcohol use: No    Alcohol/week: 0.0 standard drinks  . Drug use: No    Types: Cocaine    Comment: Past hx-none in 20 yrs     Allergies   Ace inhibitors   Review of Systems Review of Systems  Constitutional: Positive  for fatigue. Negative for chills and fever.  HENT: Negative for facial swelling and sore throat.   Respiratory: Negative for shortness of breath.   Cardiovascular: Negative for chest pain.  Gastrointestinal: Positive for blood in stool. Negative for abdominal pain, nausea and vomiting.  Genitourinary: Negative for dysuria.  Musculoskeletal: Negative for back pain.  Skin: Positive for wound. Negative for rash.  Neurological: Negative for dizziness, light-headedness and headaches.  Psychiatric/Behavioral: The patient is not nervous/anxious.      Physical Exam Updated Vital Signs BP 100/70 (BP Location: Right Arm)   Pulse 94   Temp (!) 97.3 F (36.3 C) (Oral)   Resp 18   Ht 6\' 1"  (1.854 m)   Wt 108.4 kg   SpO2 100%   BMI 31.53 kg/m   Physical Exam Vitals signs and nursing note reviewed.  Constitutional:      General: He is not in acute distress.    Appearance: He is well-developed. He is not diaphoretic.  HENT:     Head: Normocephalic and atraumatic.     Mouth/Throat:     Pharynx: No oropharyngeal exudate.  Eyes:     General: No scleral icterus.       Right eye: No discharge.        Left eye: No discharge.     Conjunctiva/sclera: Conjunctivae normal.     Pupils: Pupils are equal, round, and reactive to light.  Neck:     Musculoskeletal: Normal range of motion and neck supple.     Thyroid: No thyromegaly.    Cardiovascular:     Rate and Rhythm: Normal rate and regular rhythm.     Heart sounds: Normal heart sounds. No murmur. No friction rub. No gallop.   Pulmonary:     Effort: Pulmonary effort is normal. No respiratory distress.     Breath sounds: Normal breath sounds. No stridor. No wheezing or rales.  Abdominal:     General: Bowel sounds are normal. There is no distension.     Palpations: Abdomen is soft.     Tenderness: There is no abdominal tenderness. There is no guarding or rebound.  Lymphadenopathy:     Cervical: No cervical adenopathy.  Skin:    General: Skin is warm and dry.     Coloration: Skin is not pale.     Findings: No rash.     Comments: Very slow bleed to the left dialysis fistula  Neurological:     Mental Status: He is alert.     Coordination: Coordination normal.      ED Treatments / Results  Labs (all labs ordered are listed, but only abnormal results are displayed) Labs Reviewed  PROTIME-INR - Abnormal; Notable for the following components:      Result Value   Prothrombin Time 48.1 (*)    INR 5.35 (*)    All other components within normal limits  APTT - Abnormal; Notable for the following components:   aPTT 87 (*)    All other components within normal limits  BASIC METABOLIC PANEL - Abnormal; Notable for the following components:   Chloride 95 (*)    BUN 44 (*)    Creatinine, Ser 10.61 (*)    Calcium 8.3 (*)    GFR calc non Af Amer 5 (*)    GFR calc Af Amer 6 (*)    All other components within normal limits  CBC WITH DIFFERENTIAL/PLATELET - Abnormal; Notable for the following components:   RBC 3.58 (*)    Hemoglobin 10.8 (*)  HCT 35.6 (*)    RDW 18.4 (*)    All other components within normal limits    EKG None  Radiology No results found.  Procedures Procedures (including critical care time)  Medications Ordered in ED Medications - No data to display   Initial Impression / Assessment and Plan / ED Course  I have reviewed the  triage vital signs and the nursing notes.  Pertinent labs & imaging results that were available during my care of the patient were reviewed by me and considered in my medical decision making (see chart for details).     Patient's bleeding controlled with wound seal powder and pressure dressing.  Patient found to have elevated Coumadin level at 5.35.  His hemoglobin is higher than normal at 10.8.  Patient's BMP is stable.  No need for emergent dialysis at this time.  Patient had called Dr. Doylene Canard, his cardiologist, to make him aware of the situation.  Dr. Doylene Canard called while I was in the room and patient discussed with him what was going on and Dr. Doylene Canard would like to admit him.  He arranged for direct admit.  Patient can go home and wait for a bed.  Patient is hemodynamically stable at this time and there is no active bleeding through bandage.  Patient advised to return or go directly to [facility] if symptoms worsening or bleeding increases.  Patient discharged with stable vitals and in satisfactory condition.  Final Clinical Impressions(s) / ED Diagnoses   Final diagnoses:  Bleeding due to dialysis catheter placement, initial encounter [facility])  Elevated INR    ED Discharge Orders    None       Frederica Kuster, PA-C 06/21/18 0040    Quintella Reichert, MD 06/21/18 1004

## 2018-06-21 NOTE — Progress Notes (Signed)
Mr. Howk is a MWF [facility] patient admitted for rectal bleeding and elevated INR.  Hgb stable.  K 5.6  Plan HD today with usual dialysis orders except holding heparin.  Please advise if OBS status changes and we will do full consult.  Amalia Hailey, PA-C [facility] [phone]

## 2018-06-21 NOTE — Procedures (Signed)
I was present at this dialysis session. I have reviewed the session itself and made appropriate changes.   LGIB, supratherapeutic iNR. K 5.6  2K bath. UF goal 4L.  BP stable.  Hb stable 11.1.    Filed Weights   06/20/18 1955  Weight: 109.8 kg    Recent Labs  Lab 06/21/18 0654  NA 138  K 5.6*  CL 98  CO2 23  GLUCOSE 95  BUN 49*  CREATININE 12.04*  CALCIUM 8.4*    Recent Labs  Lab 06/20/18 1427 06/21/18 0654  WBC 5.4 4.9  NEUTROABS 3.1  --   HGB 10.8* 11.1*  HCT 35.6* 35.1*  MCV 99.4 98.9  PLT 199 173    Scheduled Meds: . amiodarone  100 mg Oral Daily  . Chlorhexidine Gluconate Cloth  6 each Topical Q0600  . doxercalciferol  3 mcg Intravenous Q M,W,F-HD  . midodrine  10 mg Oral Q M,W,F-HD  . midodrine  5 mg Oral Q T,Th,S,Su   Continuous Infusions: PRN Meds:.lidocaine-prilocaine, zolpidem   Pearson Grippe  MD 06/21/2018, 12:52 PM

## 2018-06-21 NOTE — Consult Note (Addendum)
[facility] Renal Consultation Note    Indication for Consultation:  Management of ESRD/hemodialysis; anemia, hypertension/volume and secondary hyperparathyroidism PCP: Rpdrogiez-Southworth, Sunday Spillers, PA-C  HPI: [name] is a 58 y.o. male with ESRD on MWF HD at [facility], hx MD, CAD, atrial flutter on chronic coumadin, prior nephrectomy for RCC admitted by Dr. Doylene Canard for rectal bleeding and found to have supra therapeutic INR after having inadvertently taken the wrong dose of coumadin. He noted blood tinged stools and oozing from Monday dialysis puncture site of AVF. Hgb upon presentation was 10.8 1/28 and stable at 11.1 today. K is up to 5.6 which is not at all unusual for him plts are wnl.  INR was 5.2 and did not come down with a low dose of oral vitamin K.  He was redosed today with 10 mg for an INR of 5.2   He last dialyzed 1/27 with a post HD wt of 107.4 net UF 4.4   Past Medical History:  Diagnosis Date  . Atrial flutter ([place])   . 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 Cardiomyopathy   . CHF (congestive heart failure) ([place])   . Coronary artery disease    normal coronaries by 10/10/08 cath, Cardiac Cath 08-04-12 epic.Dr. Doylene Canard follows  . Diabetes mellitus    pt. states he's borderline diabetic., no longer taking med,- off med. since 2013  . Dialysis patient [facility])    Mon-Wed-Fri([place])- Left AV fistula  . Diverticulitis November 2016   reoccurred in December 2016  . ESRD (end stage renal disease) ([place])   . GERD (gastroesophageal reflux disease)   . Gout   . History of nephrectomy 07/04/2012   History of right nephrectomy in 2002 for renal cell carcinoma   . History of unilateral nephrectomy   . Hypertension   . Low iron   . Myocardial infarction [facility]) ?2006  . Renal cell carcinoma    dialysis- MWF- Dr. Mercy Moore follows.  . Renal insufficiency   . Shortness of breath 05/19/11   "at rest, lying down, w/exertion"  . Stroke  [facility]) 02/2011   05/19/11 denies residual  . Umbilical hernia 58/52/77   unrepaired   Past Surgical History:  Procedure Laterality Date  . AV FISTULA PLACEMENT  03/29/2011   Procedure: ARTERIOVENOUS (AV) FISTULA CREATION;  Surgeon: Hinda Lenis, MD;  Location: [place];  Service: Vascular;  Laterality: Left;  LEFT RADIOCEPHALIC , Arteriovenous OEUMPNT([id number])  . CARDIAC CATHETERIZATION  05/21/11  . CARDIAC CATHETERIZATION N/A 03/16/2016   Procedure: Left Heart Cath and Coronary Angiography;  Surgeon: Dixie Dials, MD;  Location: [place] CV LAB;  Service: Cardiovascular;  Laterality: N/A;  . CHOLECYSTECTOMY N/A 02/12/2016   Procedure: LAPAROSCOPIC CHOLECYSTECTOMY WITH INTRAOPERATIVE CHOLANGIOGRAM;  Surgeon: Jackolyn Confer, MD;  Location: [place];  Service: General;  Laterality: N/A;  . COLONOSCOPY WITH PROPOFOL N/A 02/01/2017   Procedure: COLONOSCOPY WITH PROPOFOL;  Surgeon: Juanita Craver, MD;  Location: [facility];  Service: Endoscopy;  Laterality: N/A;  . dialysis cath placed    . ESOPHAGOGASTRODUODENOSCOPY (EGD) WITH PROPOFOL N/A 12/02/2015   Procedure: ESOPHAGOGASTRODUODENOSCOPY (EGD) WITH PROPOFOL;  Surgeon: Juanita Craver, MD;  Location: [facility];  Service: Endoscopy;  Laterality: N/A;  . FINGER SURGERY     L pinkie finger- ORIF- /w remaining hardware - 1990's    . HERNIA REPAIR N/A 1/54/0086   Umbilical hernia repair  . INSERTION OF DIALYSIS CATHETER N/A 01/27/2016   Procedure: INSERTION OF DIALYSIS CATHETER;  Surgeon: Waynetta Sandy, MD;  Location: [place];  Service: Vascular;  Laterality: N/A;  . INSERTION OF MESH N/A 07/04/2012   Procedure: INSERTION OF MESH;  Surgeon: Madilyn Hook, DO;  Location: [place];  Service: General;  Laterality: N/A;  . IR EMBO ART  VEN HEMORR LYMPH EXTRAV  INC GUIDE ROADMAPPING  01/13/2018  . IR FLUORO GUIDE CV LINE RIGHT  01/13/2018  . IR IVC FILTER PLMT / S&I /IMG GUID/MOD SED  01/27/2018  . IR RENAL SELECTIVE  UNI INC S&I MOD SED  01/13/2018  .  IR US GUIDE VASC ACCESS RIGHT  01/13/2018  . LAPAROSCOPIC LYSIS OF ADHESIONS N/A 02/12/2016   Procedure: LAPAROSCOPIC LYSIS OF ADHESIONS;  Surgeon: Jackolyn Confer, MD;  Location: [place];  Service: General;  Laterality: N/A;  . LEFT AND RIGHT HEART CATHETERIZATION WITH CORONARY ANGIOGRAM N/A 08/04/2012   Procedure: LEFT AND RIGHT HEART CATHETERIZATION WITH CORONARY ANGIOGRAM;  Surgeon: Birdie Riddle, MD;  Location: [place] CATH LAB;  Service: Cardiovascular;  Laterality: N/A;  . NEPHRECTOMY  2000   right  . REVISON OF ARTERIOVENOUS FISTULA Left 06/05/2013   Procedure: REVISON OF LEFT RADIOCEPHALIC  ARTERIOVENOUS FISTULA;  Surgeon: Conrad [place]field, MD;  Location: [place];  Service: Vascular;  Laterality: Left;  . REVISON OF ARTERIOVENOUS FISTULA Left 01/27/2016   Procedure: REVISION OF LEFT UPPER EXTREMITY ARTERIOVENOUS FISTULA;  Surgeon: Waynetta Sandy, MD;  Location: [place];  Service: Vascular;  Laterality: Left;  . REVISON OF ARTERIOVENOUS FISTULA Left 08/03/2016   Procedure: REVISON OF Left arm ARTERIOVENOUS FISTULA;  Surgeon: Elam Dutch, MD;  Location: [place];  Service: Vascular;  Laterality: Left;  . REVISON OF ARTERIOVENOUS FISTULA Left 05/06/2017   Procedure: REVISION PLICATION OF ARTERIOVENOUS FISTULA LEFT ARM;  Surgeon: Rosetta Posner, MD;  Location: [place];  Service: Vascular;  Laterality: Left;  . RIGHT HEART CATHETERIZATION N/A 05/21/2011   Procedure: RIGHT HEART CATH;  Surgeon: Birdie Riddle, MD;  Location: [facility] CATH LAB;  Service: Cardiovascular;  Laterality: N/A;  . smashed  1990's   "left pinky; have a plate in there"  . UMBILICAL HERNIA REPAIR N/A 07/04/2012   Procedure: LAPAROSCOPIC UMBILICAL HERNIA;  Surgeon: Madilyn Hook, DO;  Location: [place];  Service: General;  Laterality: N/A;  . US ECHOCARDIOGRAPHY  05/20/11   Family History  Problem Relation Age of Onset  . Hypertension Mother   . Kidney disease Mother    Social History:  reports that he has never smoked. He has never  used smokeless tobacco. He reports that he does not drink alcohol or use drugs. Allergies  Allergen Reactions  . Ace Inhibitors Itching and Cough   Prior to Admission medications   Medication Sig Start Date End Date Taking? Authorizing Provider  allopurinol (ZYLOPRIM) 300 MG tablet Take 150 mg by mouth every other day.   Yes [provider]  Amino Acids-Protein Hydrolys (FEEDING SUPPLEMENT, PRO-STAT SUGAR FREE 64,) LIQD Take 30 mLs by mouth 2 (two) times daily. 02/11/18  Yes Oretha Milch D, MD  amiodarone (PACERONE) 200 MG tablet Take 0.5 tablets (100 mg total) by mouth daily. 07/21/17  Yes Regalado, Belkys A, MD  calcium acetate (PHOSLO) 667 MG capsule Take 667-2,001 mg by mouth See admin instructions. Take 3 capsules with meals and 1 capsule with snacks. 01/24/18  Yes [provider]  chlorhexidine (PERIDEX) 0.12 % solution 15 mLs by Mouth Rinse route daily as needed. Mouth rinse 05/29/18  Yes [provider]  ferric  citrate (AURYXIA) 1 GM 210 MG(Fe) tablet Take 210 mg by mouth 3 (three) times daily with meals.    Yes [provider]  isosorbide dinitrate (ISORDIL) 20 MG tablet Take 20 mg by mouth 2 (two) times daily.   Yes [provider]  lidocaine-prilocaine (EMLA) cream Apply 1 application topically See admin instructions. Apply topically to port access prior to dialysis - Monday, Wednesday, Friday 04/08/15  Yes [provider]  midodrine (PROAMATINE) 10 MG tablet Take 0.5-1 tablets (5-10 mg total) by mouth See admin instructions. Take one tablet (10 mg) by mouth before dialysis (Monday, Wednesday, Friday), take 1/2 tablet (5 mg) on non-dialysis days (Sunday, Tuesday, Thursday, Saturday) as needed for diastolic blood pressure <41. Patient taking differently: Take 10 mg by mouth See admin instructions. Take one tablet (10 mg) by mouth at 5AM on Dialysis days (M,W,F), 10mg  at 5:30AM on Dialysis days (M,W,F), and 10mg  at 6AM on Dialysis days  (M,W,F) 01/23/18  Yes Emokpae, Courage, MD  mirtazapine (REMERON) 7.5 MG tablet Take 1 tablet (7.5 mg total) by mouth at bedtime. 01/23/18  Yes Emokpae, Courage, MD  multivitamin (RENA-VIT) TABS tablet Take 1 tablet by mouth at bedtime.   Yes [provider]  nitroGLYCERIN (NITROSTAT) 0.4 MG SL tablet Place 0.4 mg under the tongue every 5 (five) minutes as needed for chest pain.  05/20/15  Yes [provider]  pantoprazole (PROTONIX) 40 MG tablet Take 40 mg by mouth 2 (two) times daily.   Yes [provider]  warfarin (COUMADIN) 2.5 MG tablet Take 5 tablets (12.5 mg total) by mouth daily at 6 PM. Patient taking differently: Take 10 mg by mouth daily at 6 PM.  02/11/18  Yes Oretha Milch D, MD  warfarin (COUMADIN) 10 MG tablet Take 25 mg by mouth daily at 6 PM.    [provider]   Current Facility-Administered Medications  Medication Dose Route Frequency Provider Last Rate Last Dose  . amiodarone (PACERONE) tablet 100 mg  100 mg Oral Daily Dixie Dials, MD   100 mg at 06/21/18 1021  . Chlorhexidine Gluconate Cloth 2 % PADS 6 each  6 each Topical Q0600 Alric Seton, PA-C      . doxercalciferol (HECTOROL) injection 3 mcg  3 mcg Intravenous Q M,W,F-HD Alric Seton, PA-C      . lidocaine-prilocaine (EMLA) cream 1 application  1 application Topical PRN Dixie Dials, MD      . midodrine (PROAMATINE) tablet 10 mg  10 mg Oral Q M,W,F-HD Dixie Dials, MD   10 mg at 06/21/18 1147  . midodrine (PROAMATINE) tablet 5 mg  5 mg Oral Q T,Th,S,Su Dixie Dials, MD      . zolpidem (AMBIEN) tablet 5 mg  5 mg Oral QHS PRN Dixie Dials, MD   5 mg at 06/20/18 2300   Labs: Basic Metabolic Panel: Recent Labs  Lab 06/20/18 1427 06/21/18 0654  NA 137 138  K 4.7 5.6*  CL 95* 98  CO2 28 23  GLUCOSE 74 95  BUN 44* 49*  CREATININE 10.61* 12.04*  CALCIUM 8.3* 8.4*   Liver Function Tests: No results for input(s): AST, ALT, ALKPHOS, BILITOT, PROT, ALBUMIN in the last 168  hours. No results for input(s): LIPASE, AMYLASE in the last 168 hours. No results for input(s): AMMONIA in the last 168 hours. CBC: Recent Labs  Lab 06/20/18 1427 06/21/18 0654  WBC 5.4 4.9  NEUTROABS 3.1  --   HGB 10.8* 11.1*  HCT 35.6* 35.1*  MCV  99.4 98.9  PLT 199 173   Cardiac Enzymes: No results for input(s): CKTOTAL, CKMB, CKMBINDEX, TROPONINI in the last 168 hours. CBG: Recent Labs  Lab 06/20/18 1957 06/21/18 0728  GLUCAP 95 78   ROS: As per HPI otherwise negative.  Physical Exam: Vitals:   06/21/18 1215 06/21/18 1230 06/21/18 1235 06/21/18 1300  BP: (!) 114/59 106/69 101/71 111/72  Pulse: 89 87 85 83  Resp: 17 19 18 19   Temp: 97.7 F (36.5 C)     TempSrc: Oral     SpO2: 100%     Weight: 110.4 kg     Height:         General: WDWN NAD Head: NCAT sclera not icteric MMM Neck: Supple.  Lungs: CTA bilaterally without wheezes, rales, or rhonchi. Breathing is unlabored. Heart: RRR with S1 S2.  Abdomen: obese soft NT + BS Lower extremities:without edema  Neuro: A & O  X 3. Moves all extremities spontaneously. Psych:  Responds to questions appropriately with a normal affect. Dialysis Access: left AVF  + bruit  Dialysis Orders:  [place]  MWF4 hr EDW 106 450/800 2 K 2 Ca profile 4 AVF heparin 2000 PArsabiv 12.5 hectorol 3 No ESA/Fe  Assessment/Plan: 1. Supratherapeutic INR with secondary rectal bleeding/access oozing 10 mg vit K given today- holding heparin with HD today- oozing of AVF had resolved this am - no more rectal bleeding 2. ESRD with hyperkalemia-  MWF HD today on schedule 3. Hypertension/volume  - prone to high IDWG - midodrine for BP support  4. Anemia  - Hgb stable - no ESA/Fe 5. Metabolic bone disease -  Continue hectorol - no parsabiv available in hospital- tells me he is on one fosrenol and 2 Aurixya ac 6. Nutrition - renal carb mod diet/vit 7. Aflutter - on amio - in NSR at [place] as above  Myriam Jacobson, PA-C [employer] [phone] 06/21/2018, 1:14 PM

## 2018-06-21 NOTE — Progress Notes (Signed)
Ref: Shelby Mattocks, PA-C   Subjective:  INR still high at 5.2, PTT is 47.1. Elevated potassium. VS stable. Hgb holding at 11.1. Appreciate renal consult.  Objective:  Vital Signs in the last 24 hours: Temp:  [97.7 F (36.5 C)-98.5 F (36.9 C)] 97.7 F (36.5 C) (01/29 0946) Pulse Rate:  [84-107] 84 (01/29 0946) Resp:  [18-20] 20 (01/29 0946) BP: (96-106)/(62-83) 105/62 (01/29 0946) SpO2:  [98 %-100 %] 98 % (01/29 0946) Weight:  [109.8 kg] 109.8 kg (01/28 1955)  Physical Exam: BP Readings from Last 1 Encounters:  06/21/18 105/62     Wt Readings from Last 1 Encounters:  06/20/18 109.8 kg    Weight change:  Body mass index is 31.94 kg/m. HEENT: [place]/AT, Eyes-Brown, Conjunctiva-Pink, Sclera-Non-icteric Neck: No JVD, No bruit, Trachea midline. Lungs:  Clear, Bilateral. Cardiac:  Regular rhythm, normal S1 and S2, no S3. II/VI systolic murmur. Abdomen:  Soft, non-tender. BS present. Extremities:  No edema present. No cyanosis. No clubbing. Left forearm AVF with dressing. CNS: AxOx3, Cranial nerves grossly intact, moves all 4 extremities.  Skin: Warm and dry.   Intake/Output from previous day: 01/28 0701 - 01/29 0700 In: 540 [P.O.:540] Out: 0     Lab Results: BMET    Component Value Date/Time   NA 138 06/21/2018 0654   NA 137 06/20/2018 1427   NA 132 (L) 02/10/2018 0952   K 5.6 (H) 06/21/2018 0654   K 4.7 06/20/2018 1427   K 3.8 02/10/2018 0952   CL 98 06/21/2018 0654   CL 95 (L) 06/20/2018 1427   CL 92 (L) 02/10/2018 0952   CO2 23 06/21/2018 0654   CO2 28 06/20/2018 1427   CO2 26 02/10/2018 0952   GLUCOSE 95 06/21/2018 0654   GLUCOSE 74 06/20/2018 1427   GLUCOSE 73 02/10/2018 0952   BUN 49 (H) 06/21/2018 0654   BUN 44 (H) 06/20/2018 1427   BUN 35 (H) 02/10/2018 0952   CREATININE 12.04 (H) 06/21/2018 0654   CREATININE 10.61 (H) 06/20/2018 1427   CREATININE 9.62 (H) 02/10/2018 0952   CALCIUM 8.4 (L) 06/21/2018 0654   CALCIUM 8.3 (L) 06/20/2018  1427   CALCIUM 10.6 (H) 02/10/2018 0952   GFRNONAA 4 (L) 06/21/2018 0654   GFRNONAA 5 (L) 06/20/2018 1427   GFRNONAA 5 (L) 02/10/2018 0952   GFRAA 5 (L) 06/21/2018 0654   GFRAA 6 (L) 06/20/2018 1427   GFRAA 6 (L) 02/10/2018 0952   CBC    Component Value Date/Time   WBC 4.9 06/21/2018 0654   RBC 3.55 (L) 06/21/2018 0654   HGB 11.1 (L) 06/21/2018 0654   HCT 35.1 (L) 06/21/2018 0654   PLT 173 06/21/2018 0654   MCV 98.9 06/21/2018 0654   MCH 31.3 06/21/2018 0654   MCHC 31.6 06/21/2018 0654   RDW 18.1 (H) 06/21/2018 0654   LYMPHSABS 1.2 06/20/2018 1427   MONOABS 0.7 06/20/2018 1427   EOSABS 0.3 06/20/2018 1427   BASOSABS 0.0 06/20/2018 1427   HEPATIC Function Panel Recent Labs    01/28/18 0256 01/31/18 0436 02/04/18 0628  PROT 7.4 8.2* 8.2*   HEMOGLOBIN A1C No components found for: HGA1C,  MPG CARDIAC ENZYMES Lab Results  Component Value Date   CKTOTAL 136 03/16/2017   CKMB 4.3 (H) 05/20/2011   TROPONINI 0.20 (HH) 01/13/2018   TROPONINI 0.21 (HH) 01/13/2018   TROPONINI 0.27 (HH) 01/13/2018   BNP No results for input(s): PROBNP in the last 8760 hours. TSH Recent Labs    06/20/18 1128  TSH 1.270   CHOLESTEROL Recent Labs    06/20/18 1128  CHOL 213*    Scheduled Meds: . amiodarone  100 mg Oral Daily  . Chlorhexidine Gluconate Cloth  6 each Topical Q0600  . doxercalciferol  3 mcg Intravenous Q M,W,F-HD  . midodrine  10 mg Oral Q M,W,F-HD  . midodrine  5 mg Oral Q T,Th,S,Su   Continuous Infusions: PRN Meds:.lidocaine-prilocaine, zolpidem  Assessment/Plan: Acute rectal bleed Supra therapeutic INR Hypotension ESRD Type 2 DM Anemia of chronic disease Paroxysmal atrial flutter Dilated cardiomyopathy  Additional vitamin K. GI consult if rectal bleed continues.   LOS: 0 days    Dixie Dials  MD  06/21/2018, 12:50 PM

## 2018-06-22 LAB — CBC
HCT: 33.9 % — ABNORMAL LOW (ref 39.0–52.0)
Hemoglobin: 10.6 g/dL — ABNORMAL LOW (ref 13.0–17.0)
MCH: 30 pg (ref 26.0–34.0)
MCHC: 31.3 g/dL (ref 30.0–36.0)
MCV: 96 fL (ref 80.0–100.0)
Platelets: 204 10*3/uL (ref 150–400)
RBC: 3.53 MIL/uL — ABNORMAL LOW (ref 4.22–5.81)
RDW: 17.5 % — ABNORMAL HIGH (ref 11.5–15.5)
WBC: 6.8 10*3/uL (ref 4.0–10.5)
nRBC: 0 % (ref 0.0–0.2)

## 2018-06-22 LAB — PROTIME-INR
INR: 1.44
Prothrombin Time: 17.4 seconds — ABNORMAL HIGH (ref 11.4–15.2)

## 2018-06-22 LAB — HEPATITIS B SURFACE ANTIGEN: Hepatitis B Surface Ag: NEGATIVE

## 2018-06-22 MED ORDER — DOXERCALCIFEROL 4 MCG/2ML IV SOLN: 3.0000 ug | INTRAVENOUS | Status: DC

## 2018-06-22 MED ORDER — WARFARIN SODIUM 10 MG PO TABS: 10.0000 mg | ORAL_TABLET | Freq: Every day | ORAL | Status: DC

## 2018-06-22 MED ORDER — CHLORHEXIDINE GLUCONATE CLOTH 2 % EX PADS: 6.0000 | MEDICATED_PAD | Freq: Every day | CUTANEOUS | Status: DC

## 2018-06-22 NOTE — Progress Notes (Addendum)
[facility] Progress Note   Dialysis Orders: [place]  MWF4 hr EDW 106 450/800 2 K 2 Ca profile 4 AVF heparin 2000 PArsabiv 12.5 hectorol 3 No ESA/Fe  Assessment/Plan: 1. Supratherapeutic INR with secondary rectal bleeding/access oozing 10 mg vit K given today- holding heparin with HD today- again slight oozing of AVF post HD - distal site only- no more rectal bleeding 2. ESRD with hyperkalemia-  MWF HD Friday if still here 3. BP /volume  - prone to high IDWG - midodrine for BP support - tolerates BP into 70s - didn't like being turned off for BP in 80s - net UF 3.1 Wed - post wt 107.2 only 1.2 above EDW 4. Anemia  - Hgb stable - no ESA/Fe- labs pending today 5. Metabolic bone disease -  Continue hectorol - no parsabiv available in hospital- tells me he is on one fosrenol and 2 Aurixya ac 6. Nutrition - changed from heart healthy to renal due to tendency to high K levels 7. Aflutter - on amio - in NSR at [place] as above    Myriam Jacobson, PA-C [place] [phone] 06/22/2018,9:24 AM  LOS: 1 day   Subjective:   Slight oozing from distal cannulation site post HD yesterday into this am.  Labs being drawn  Objective Vitals:   06/21/18 1600 06/21/18 1630 06/21/18 1640 06/21/18 1732  BP: (!) 105/32 (!) 79/35 (!) 89/62 96/77  Pulse: (!) 50 (!) 43 92 92  Resp:   18 20  Temp:   (!) 97.4 F (36.3 C) (!) 97.5 F (36.4 C)  TempSrc:   Oral Oral  SpO2:   100% 100%  Weight:   107.2 kg   Height:       Physical Exam General: NAD  Heart:RRR Lungs: grossly clear Abdomen: obese soft NT Extremities: no LE edema Dialysis Access:  Left lower AVF + bruit - scant blood on bandage   Additional Objective Labs: Basic Metabolic Panel: Recent Labs  Lab 06/20/18 1427 06/21/18 0654  NA 137 138  K 4.7 5.6*  CL 95* 98  CO2 28 23  GLUCOSE 74 95  BUN 44* 49*  CREATININE 10.61* 12.04*  CALCIUM 8.3* 8.4*   Liver Function Tests: No results for  input(s): AST, ALT, ALKPHOS, BILITOT, PROT, ALBUMIN in the last 168 hours. No results for input(s): LIPASE, AMYLASE in the last 168 hours. CBC: Recent Labs  Lab 06/20/18 1427 06/21/18 0654  WBC 5.4 4.9  NEUTROABS 3.1  --   HGB 10.8* 11.1*  HCT 35.6* 35.1*  MCV 99.4 98.9  PLT 199 173   Blood Culture    Component Value Date/Time   SDES BLOOD RIGHT ANTECUBITAL 01/26/2018 2045   SDES BLOOD RIGHT HAND 01/26/2018 2045   SPECREQUEST  01/26/2018 2045    BOTTLES DRAWN AEROBIC AND ANAEROBIC Blood Culture adequate volume   SPECREQUEST  01/26/2018 2045    BOTTLES DRAWN AEROBIC ONLY Blood Culture results may not be optimal due to an inadequate volume of blood received in culture bottles   CULT  01/26/2018 2045    NO GROWTH 5 DAYS Performed at [facility] [address], [place], [place] [address]    CULT  01/26/2018 2045    NO GROWTH 5 DAYS Performed at [facility], [place] [address], [place], [place] [address]    REPTSTATUS 01/31/2018 FINAL 01/26/2018 2045   REPTSTATUS 01/31/2018 FINAL 01/26/2018 2045    Cardiac Enzymes: No results for input(s): CKTOTAL, CKMB,  CKMBINDEX, TROPONINI in the last 168 hours. CBG: Recent Labs  Lab 06/20/18 1957 06/21/18 0728  GLUCAP 95 78   Iron Studies: No results for input(s): IRON, TIBC, TRANSFERRIN, FERRITIN in the last 72 hours. Lab Results  Component Value Date   INR 5.20 (HH) 06/21/2018   INR 5.35 (HH) 06/20/2018   INR 2.17 02/11/2018   Studies/Results: No results found. Medications:  . amiodarone  100 mg Oral Daily  . Chlorhexidine Gluconate Cloth  6 each Topical Q0600  . doxercalciferol  3 mcg Intravenous Q M,W,F-HD  . ferric citrate  420 mg Oral TID WC  . lanthanum  1,000 mg Oral TID WC  . midodrine  10 mg Oral Q M,W,F-HD  . midodrine  5 mg Oral Q T,Th,S,Su  . multivitamin  1 tablet Oral QHS

## 2018-06-22 NOTE — Discharge Summary (Signed)
Physician Discharge Summary  Patient ID: [name] MRN: [id number] DOB/AGE: December 28, 1960 58 y.o.  Admit date: 06/20/2018 Discharge date: 06/22/2018  Admission Diagnoses: Acute rectal bleed Supra-therapeutic PT/INR ESRD Type 2 DM Obesity Anemia of chronic disease Paroxysmal atrial flutter Dilated cardiomyopathy  Discharge Diagnoses:  Principal Problem:   Supratherapeutic INR Active Problems:   DM II (diabetes mellitus, type II), controlled ([place])   Melena   Gout, chronic   ESRD (end stage renal disease) on dialysis (HCC)    Hypotension   Anemia of chronic disease and iron deficiency   Obesity   Warfarin overdose, accidental/unintentional Paroxysmal atrial flutter, CHA2DS2VASc score of 3  Discharged Condition: fair  Hospital Course: 58 year old male with ESRD, atrial flutter, dilated cardiomyopathy, CAD, Obesity and type 2 DM took 25 mg. Of warfarin instead of 10 mg.  Daily for 5 days. He presented with blood oozing from AV graft dialysis puncture site as well as noticed blood mixed with stool. His INR was 5.35. With vitamin K 2.5 mg. It did not drop as much hence 10 mg. Dose was used with near normalization of PT. He had no additional bleeding.  He had renal consult and hemodialysis. His Hgb count remained unchanged. He was discharged home in stable condition and was advised to verify dose prior to taking warfarin. His wife is also going to assist him in drug compliance. He will see me in 1 week and see primary care in 1 month.  Mild anemia with Hgb of  Consults: cardiology and nephrology  Significant Diagnostic Studies: labs: Hgb 10.8 g. Hgb A1C of 5.6. BUN/Cr of 44/10.61. INR-on admission 5.35, 5.20 and on day of discharge 1.44.  Treatments: dialysis: Hemodialysis and vitamin K.  Discharge Exam: Blood pressure 96/77, pulse 92, temperature (!) 97.5 F (36.4 C), temperature source Oral, resp. rate 20, height 6\' 1"  (1.854 m), weight 107.2 kg, SpO2 100 %. General  appearance: alert, cooperative and appears stated age. Head: Normocephalic, atraumatic. Eyes: Brown eyes, pink conjunctiva, corneas clear. PERRL, EOM's intact.  Neck: No adenopathy, no carotid bruit, no JVD, supple, symmetrical, trachea midline and thyroid not enlarged. Resp: Clear to auscultation bilaterally. Cardio: Regular rate and rhythm, S1, S2 normal, II/VI systolic murmur, no click, rub or gallop. GI: Soft, non-tender; bowel sounds normal; no organomegaly. Extremities: No edema, cyanosis or clubbing. Left wrist AVF. Skin: Warm and dry.  Neurologic: Alert and oriented X 3, normal strength and tone. Normal coordination and gait.  Disposition: Discharge disposition: 01-Home or Self Care        Allergies as of 06/22/2018      Reactions   Ace Inhibitors Itching, Cough      Medication List    TAKE these medications   allopurinol 300 MG tablet Commonly known as:  ZYLOPRIM Take 150 mg by mouth every other day.   amiodarone 200 MG tablet Commonly known as:  PACERONE Take 0.5 tablets (100 mg total) by mouth daily.   AURYXIA 1 GM 210 MG(Fe) tablet Generic drug:  ferric citrate Take 210 mg by mouth 3 (three) times daily with meals.   calcium acetate 667 MG capsule Commonly known as:  PHOSLO Take 667-2,001 mg by mouth See admin instructions. Take 3 capsules with meals and 1 capsule with snacks.   chlorhexidine 0.12 % solution Commonly known as:  PERIDEX 15 mLs by Mouth Rinse route daily as needed. Mouth rinse   doxercalciferol 4 MCG/2ML injection Commonly known as:  HECTOROL Inject 1.5 mLs (3 mcg total) into the vein  every Monday, Wednesday, and Friday with hemodialysis. Start taking on:  June 23, 2018   feeding supplement (PRO-STAT SUGAR FREE 64) Liqd Take 30 mLs by mouth 2 (two) times daily.   isosorbide dinitrate 20 MG tablet Commonly known as:  ISORDIL Take 20 mg by mouth 2 (two) times daily.   lidocaine-prilocaine cream Commonly known as:  EMLA Apply 1  application topically See admin instructions. Apply topically to port access prior to dialysis - Monday, Wednesday, Friday   midodrine 10 MG tablet Commonly known as:  PROAMATINE Take 0.5-1 tablets (5-10 mg total) by mouth See admin instructions. Take one tablet (10 mg) by mouth before dialysis (Monday, Wednesday, Friday), take 1/2 tablet (5 mg) on non-dialysis days (Sunday, Tuesday, Thursday, Saturday) as needed for diastolic blood pressure <20. What changed:    how much to take  additional instructions   mirtazapine 7.5 MG tablet Commonly known as:  REMERON Take 1 tablet (7.5 mg total) by mouth at bedtime.   multivitamin Tabs tablet Take 1 tablet by mouth at bedtime.   nitroGLYCERIN 0.4 MG SL tablet Commonly known as:  NITROSTAT Place 0.4 mg under the tongue every 5 (five) minutes as needed for chest pain.   pantoprazole 40 MG tablet Commonly known as:  PROTONIX Take 40 mg by mouth 2 (two) times daily.   warfarin 10 MG tablet Commonly known as:  COUMADIN Take 1 tablet (10 mg total) by mouth daily at 6 PM. What changed:    how much to take  Another medication with the same name was removed. Continue taking this medication, and follow the directions you see here.      Follow-up Information    [facility] [place], [place]. Schedule an appointment as soon as possible for a visit in 1 month(s).   Specialties:  Internal Medicine, Emergency Medicine Contact information: [address] [place] [address] [phone]        Dixie Dials, MD. Schedule an appointment as soon as possible for a visit in 1 week(s).   Specialty:  Cardiology Contact information: [place] [address] [phone]           Signed: Birdie Riddle 06/22/2018, 9:54 AM

## 2018-06-22 NOTE — Progress Notes (Signed)
Patient Discharge: Disposition: Patient discharged to home. Education: Reviewed medications, prescriptions, follow-up appointments and discharge instructions, verbalized understanding. IV: Discontinued IV before discharge. Telemetry: N/A  Transportation: Patient escorted out of the unit in w/c. Belongings: Patient took all his belongings with him.

## 2018-06-23 DIAGNOSIS — N186 End stage renal disease: Secondary | ICD-10-CM | POA: Diagnosis not present

## 2018-06-23 DIAGNOSIS — E1129 Type 2 diabetes mellitus with other diabetic kidney complication: Secondary | ICD-10-CM | POA: Diagnosis not present

## 2018-06-23 DIAGNOSIS — Z992 Dependence on renal dialysis: Secondary | ICD-10-CM | POA: Diagnosis not present

## 2018-06-23 DIAGNOSIS — N2581 Secondary hyperparathyroidism of renal origin: Secondary | ICD-10-CM | POA: Diagnosis not present

## 2018-06-23 DIAGNOSIS — D631 Anemia in chronic kidney disease: Secondary | ICD-10-CM | POA: Diagnosis not present

## 2018-06-23 DIAGNOSIS — D689 Coagulation defect, unspecified: Secondary | ICD-10-CM | POA: Diagnosis not present

## 2018-06-24 DIAGNOSIS — N186 End stage renal disease: Secondary | ICD-10-CM | POA: Diagnosis not present

## 2018-06-24 DIAGNOSIS — Z992 Dependence on renal dialysis: Secondary | ICD-10-CM | POA: Diagnosis not present

## 2018-06-24 DIAGNOSIS — E8770 Fluid overload, unspecified: Secondary | ICD-10-CM | POA: Diagnosis not present

## 2018-06-24 DIAGNOSIS — D631 Anemia in chronic kidney disease: Secondary | ICD-10-CM | POA: Diagnosis not present

## 2018-06-24 DIAGNOSIS — E1129 Type 2 diabetes mellitus with other diabetic kidney complication: Secondary | ICD-10-CM | POA: Diagnosis not present

## 2018-06-24 DIAGNOSIS — N2581 Secondary hyperparathyroidism of renal origin: Secondary | ICD-10-CM | POA: Diagnosis not present

## 2018-06-24 DIAGNOSIS — I120 Hypertensive chronic kidney disease with stage 5 chronic kidney disease or end stage renal disease: Secondary | ICD-10-CM | POA: Diagnosis not present

## 2018-06-26 DIAGNOSIS — D631 Anemia in chronic kidney disease: Secondary | ICD-10-CM | POA: Diagnosis not present

## 2018-06-26 DIAGNOSIS — E8770 Fluid overload, unspecified: Secondary | ICD-10-CM | POA: Diagnosis not present

## 2018-06-26 DIAGNOSIS — N2581 Secondary hyperparathyroidism of renal origin: Secondary | ICD-10-CM | POA: Diagnosis not present

## 2018-06-26 DIAGNOSIS — E1129 Type 2 diabetes mellitus with other diabetic kidney complication: Secondary | ICD-10-CM | POA: Diagnosis not present

## 2018-06-26 DIAGNOSIS — N186 End stage renal disease: Secondary | ICD-10-CM | POA: Diagnosis not present

## 2018-06-28 DIAGNOSIS — E8770 Fluid overload, unspecified: Secondary | ICD-10-CM | POA: Diagnosis not present

## 2018-06-28 DIAGNOSIS — D689 Coagulation defect, unspecified: Secondary | ICD-10-CM | POA: Diagnosis not present

## 2018-06-28 DIAGNOSIS — N186 End stage renal disease: Secondary | ICD-10-CM | POA: Diagnosis not present

## 2018-06-28 DIAGNOSIS — N2581 Secondary hyperparathyroidism of renal origin: Secondary | ICD-10-CM | POA: Diagnosis not present

## 2018-06-28 DIAGNOSIS — D631 Anemia in chronic kidney disease: Secondary | ICD-10-CM | POA: Diagnosis not present

## 2018-06-28 DIAGNOSIS — E1129 Type 2 diabetes mellitus with other diabetic kidney complication: Secondary | ICD-10-CM | POA: Diagnosis not present

## 2018-06-30 DIAGNOSIS — D631 Anemia in chronic kidney disease: Secondary | ICD-10-CM | POA: Diagnosis not present

## 2018-06-30 DIAGNOSIS — N2581 Secondary hyperparathyroidism of renal origin: Secondary | ICD-10-CM | POA: Diagnosis not present

## 2018-06-30 DIAGNOSIS — E8770 Fluid overload, unspecified: Secondary | ICD-10-CM | POA: Diagnosis not present

## 2018-06-30 DIAGNOSIS — E1129 Type 2 diabetes mellitus with other diabetic kidney complication: Secondary | ICD-10-CM | POA: Diagnosis not present

## 2018-06-30 DIAGNOSIS — N186 End stage renal disease: Secondary | ICD-10-CM | POA: Diagnosis not present

## 2018-07-03 DIAGNOSIS — E1129 Type 2 diabetes mellitus with other diabetic kidney complication: Secondary | ICD-10-CM | POA: Diagnosis not present

## 2018-07-03 DIAGNOSIS — D631 Anemia in chronic kidney disease: Secondary | ICD-10-CM | POA: Diagnosis not present

## 2018-07-03 DIAGNOSIS — N186 End stage renal disease: Secondary | ICD-10-CM | POA: Diagnosis not present

## 2018-07-03 DIAGNOSIS — E8770 Fluid overload, unspecified: Secondary | ICD-10-CM | POA: Diagnosis not present

## 2018-07-03 DIAGNOSIS — N2581 Secondary hyperparathyroidism of renal origin: Secondary | ICD-10-CM | POA: Diagnosis not present

## 2018-07-05 ENCOUNTER — Encounter: Payer: Self-pay | Admitting: Internal Medicine

## 2018-07-05 ENCOUNTER — Ambulatory Visit (INDEPENDENT_AMBULATORY_CARE_PROVIDER_SITE_OTHER): Payer: Medicare Other | Admitting: Internal Medicine

## 2018-07-05 ENCOUNTER — Other Ambulatory Visit: Payer: Self-pay

## 2018-07-05 VITALS — BP 98/60 | HR 98 | Temp 97.9°F | Ht 67.6 in | Wt 237.4 lb

## 2018-07-05 DIAGNOSIS — D631 Anemia in chronic kidney disease: Secondary | ICD-10-CM | POA: Diagnosis not present

## 2018-07-05 DIAGNOSIS — Z1211 Encounter for screening for malignant neoplasm of colon: Secondary | ICD-10-CM

## 2018-07-05 DIAGNOSIS — Z86010 Personal history of colonic polyps: Secondary | ICD-10-CM | POA: Diagnosis not present

## 2018-07-05 DIAGNOSIS — N186 End stage renal disease: Secondary | ICD-10-CM | POA: Diagnosis not present

## 2018-07-05 DIAGNOSIS — D689 Coagulation defect, unspecified: Secondary | ICD-10-CM | POA: Diagnosis not present

## 2018-07-05 DIAGNOSIS — E8770 Fluid overload, unspecified: Secondary | ICD-10-CM | POA: Diagnosis not present

## 2018-07-05 DIAGNOSIS — K649 Unspecified hemorrhoids: Secondary | ICD-10-CM | POA: Diagnosis not present

## 2018-07-05 DIAGNOSIS — K625 Hemorrhage of anus and rectum: Secondary | ICD-10-CM

## 2018-07-05 DIAGNOSIS — E1129 Type 2 diabetes mellitus with other diabetic kidney complication: Secondary | ICD-10-CM | POA: Diagnosis not present

## 2018-07-05 DIAGNOSIS — N2581 Secondary hyperparathyroidism of renal origin: Secondary | ICD-10-CM | POA: Diagnosis not present

## 2018-07-05 LAB — POC HEMOCCULT BLD/STL (OFFICE/1-CARD/DIAGNOSTIC): Fecal Occult Blood, POC: NEGATIVE

## 2018-07-05 MED ORDER — HYDROCORTISONE ACETATE 25 MG RE SUPP: 25.0000 mg | Freq: Two times a day (BID) | RECTAL | 1 refills | Status: DC

## 2018-07-05 NOTE — Progress Notes (Signed)
Subjective:     Patient ID: [name] , male    DOB: 1961/04/05 , 58 y.o.   MRN: [id number]   Chief Complaint  Patient presents with  . Rectal Bleeding    sore/ never stopped/    HPI  Noticed blood after having a BM off and on x 1 week. Has never done this before.  Denies abdominal pain or fever. Denies history of hemorrhoids. His last colonoscopy 2019 and had polyps.   Past Medical History:  Diagnosis Date  . Atrial flutter ([place])   . 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 Cardiomyopathy   . CHF (congestive heart failure) ([place])   . Coronary artery disease    normal coronaries by 10/10/08 cath, Cardiac Cath 08-04-12 epic.Dr. Doylene Canard follows  . Diabetes mellitus    pt. states he's borderline diabetic., no longer taking med,- off med. since 2013  . Dialysis patient [facility])    Mon-Wed-Fri([place])- Left AV fistula  . Diverticulitis November 2016   reoccurred in December 2016  . ESRD (end stage renal disease) ([place])   . GERD (gastroesophageal reflux disease)   . Gout   . History of nephrectomy 07/04/2012   History of right nephrectomy in 2002 for renal cell carcinoma   . History of unilateral nephrectomy   . Hypertension   . Low iron   . Myocardial infarction [facility]) ?2006  . Renal cell carcinoma    dialysis- MWF- Dr. Mercy Moore follows.  . Renal insufficiency   . Shortness of breath 05/19/11   "at rest, lying down, w/exertion"  . Stroke [facility]) 02/2011   05/19/11 denies residual  . Umbilical hernia 34/19/37   unrepaired     Family History  Problem Relation Age of Onset  . Hypertension Mother   . Kidney disease Mother      Current Outpatient Medications:  .  allopurinol (ZYLOPRIM) 300 MG tablet, Take 150 mg by mouth every other day., Disp: , Rfl:  .  Amino Acids-Protein Hydrolys (FEEDING SUPPLEMENT, PRO-STAT SUGAR FREE 64,) LIQD, Take 30 mLs by mouth 2 (two) times daily., Disp: 900 mL, Rfl: 0 .  amiodarone (PACERONE) 200 MG tablet,  Take 0.5 tablets (100 mg total) by mouth daily., Disp: 30 tablet, Rfl: 1 .  calcium acetate (PHOSLO) 667 MG capsule, Take 667-2,001 mg by mouth See admin instructions. Take 3 capsules with meals and 1 capsule with snacks., Disp: , Rfl:  .  chlorhexidine (PERIDEX) 0.12 % solution, 15 mLs by Mouth Rinse route daily as needed. Mouth rinse, Disp: , Rfl:  .  doxercalciferol (HECTOROL) 4 MCG/2ML injection, Inject 1.5 mLs (3 mcg total) into the vein every Monday, Wednesday, and Friday with hemodialysis., Disp: , Rfl:  .  ferric citrate (AURYXIA) 1 GM 210 MG(Fe) tablet, Take 210 mg by mouth 3 (three) times daily with meals. , Disp: , Rfl:  .  isosorbide dinitrate (ISORDIL) 20 MG tablet, Take 20 mg by mouth 2 (two) times daily., Disp: , Rfl:  .  lidocaine-prilocaine (EMLA) cream, Apply 1 application topically See admin instructions. Apply topically to port access prior to dialysis - Monday, Wednesday, Friday, Disp: , Rfl: 12 .  midodrine (PROAMATINE) 10 MG tablet, Take 0.5-1 tablets (5-10 mg total) by mouth See admin instructions. Take one tablet (10 mg) by mouth before dialysis (Monday, Wednesday, Friday), take 1/2 tablet (5 mg) on non-dialysis days (Sunday, Tuesday, Thursday, Saturday) as needed for diastolic blood pressure <90. (Patient taking differently: Take  10 mg by mouth See admin instructions. Take one tablet (10 mg) by mouth at 5AM on Dialysis days (M,W,F), 10mg  at 5:30AM on Dialysis days (M,W,F), and 10mg  at 6AM on Dialysis days (M,W,F)), Disp: 120 tablet, Rfl: 2 .  mirtazapine (REMERON) 7.5 MG tablet, Take 1 tablet (7.5 mg total) by mouth at bedtime., Disp: 30 tablet, Rfl: 2 .  multivitamin (RENA-VIT) TABS tablet, Take 1 tablet by mouth at bedtime., Disp: , Rfl:  .  nitroGLYCERIN (NITROSTAT) 0.4 MG SL tablet, Place 0.4 mg under the tongue every 5 (five) minutes as needed for chest pain. , Disp: , Rfl: 0 .  pantoprazole (PROTONIX) 40 MG tablet, Take 40 mg by mouth 2 (two) times daily., Disp: , Rfl:  .   warfarin (COUMADIN) 10 MG tablet, Take 1 tablet (10 mg total) by mouth daily at 6 PM., Disp: , Rfl:    Allergies  Allergen Reactions  . Ace Inhibitors Itching and Cough     Review of Systems  Constitutional: Negative for chills, diaphoresis, fatigue and fever.  Gastrointestinal: Positive for anal bleeding, blood in stool and rectal pain. Negative for abdominal distention, abdominal pain, constipation, diarrhea, nausea and vomiting.  Skin: Negative for rash and wound.  Neurological: Negative for dizziness.     Today's Vitals   07/05/18 1512  BP: 98/60  Pulse: 98  Temp: 97.9 F (36.6 C)  TempSrc: Oral  SpO2: 95%  Weight: 237 lb 6.4 oz (107.7 kg)  Height: 5' 7.6" (1.717 m)   Body mass index is 36.52 kg/m.   Objective:  Physical Exam Vitals signs and nursing note reviewed.  Constitutional:      General: He is not in acute distress.    Appearance: Normal appearance. He is not toxic-appearing.  HENT:     Head: Normocephalic.     Right Ear: External ear normal.     Left Ear: External ear normal.     Nose: Nose normal.  Eyes:     General: Scleral icterus present.     Conjunctiva/sclera: Conjunctivae normal.  Neck:     Musculoskeletal: Neck supple.  Pulmonary:     Effort: Pulmonary effort is normal.  Genitourinary:    Rectum: Guaiac result negative.     Comments: Perianal area revealed a tender external soft mass around 12 o'clock 1/2 x 1/2 cm, which looks like an external hemorrhoid, but did not look thrombosed.  I could not do a full digital exam due to the pain.  Neurological:     Mental Status: He is alert.    Hemoccult with small amt of stool I had was neg.  Assessment And Plan:    1. Rectal bleeding- acute, but resolved today.  - POC Hemoccult Bld/Stl (1-Cd Office Dx) 2- hemorrhoid- I placed him on Anusol suppositories and advised him to do sitz baths and apply preparation H exernally. Fu next week at which time hopefully I can do a better exam and he is not as  tender.    Hayze Gazda [facility], PA-C

## 2018-07-05 NOTE — Patient Instructions (Addendum)
Do sitz bath's for 15 minutes 2-4 times a day, specially after a BM for 5- 7 days. You need to make sure you have soft stools, so take a stool softer like Colase or Miralax which are both over the counter.  Get preparation H cream and apply externally three times a day.  If you get worse, go to ER   Hemorrhoids Hemorrhoids are swollen veins that may develop:  In the butt (rectum). These are called internal hemorrhoids.  Around the opening of the butt (anus). These are called external hemorrhoids. Hemorrhoids can cause pain, itching, or bleeding. Most of the time, they do not cause serious problems. They usually get better with diet changes, lifestyle changes, and other home treatments. What are the causes? This condition may be caused by:  Having trouble pooping (constipation).  Pushing hard (straining) to poop.  Watery poop (diarrhea).  Pregnancy.  Being very overweight (obese).  Sitting for long periods of time.  Heavy lifting or other activity that causes you to strain.  Anal sex.  Riding a bike for a long period of time. What are the signs or symptoms? Symptoms of this condition include:  Pain.  Itching or soreness in the butt.  Bleeding from the butt.  Leaking poop.  Swelling in the area.  One or more lumps around the opening of your butt. How is this diagnosed? A doctor can often diagnose this condition by looking at the affected area. The doctor may also:  Do an exam that involves feeling the area with a gloved hand (digital rectal exam).  Examine the area inside your butt using a small tube (anoscope).  Order blood tests. This may be done if you have lost a lot of blood.  Have you get a test that involves looking inside the colon using a flexible tube with a camera on the end (sigmoidoscopy or colonoscopy). How is this treated? This condition can usually be treated at home. Your doctor may tell you to change what you eat, make lifestyle changes, or try  home treatments. If these do not help, procedures can be done to remove the hemorrhoids or make them smaller. These may involve:  Placing rubber bands at the base of the hemorrhoids to cut off their blood supply.  Injecting medicine into the hemorrhoids to shrink them.  Shining a type of light energy onto the hemorrhoids to cause them to fall off.  Doing surgery to remove the hemorrhoids or cut off their blood supply. Follow these instructions at home: Eating and drinking   Eat foods that have a lot of fiber in them. These include whole grains, beans, nuts, fruits, and vegetables.  Ask your doctor about taking products that have added fiber (fibersupplements).  Reduce the amount of fat in your diet. You can do this by: ? Eating low-fat dairy products. ? Eating less red meat. ? Avoiding processed foods.  Drink enough fluid to keep your pee (urine) pale yellow. Managing pain and swelling   Take a warm-water bath (sitz bath) for 20 minutes to ease pain. Do this 3-4 times a day. You may do this in a bathtub or using a portable sitz bath that fits over the toilet.  If told, put ice on the painful area. It may be helpful to use ice between your warm baths. ? Put ice in a plastic bag. ? Place a towel between your skin and the bag. ? Leave the ice on for 20 minutes, 2-3 times a day. General instructions  Take over-the-counter and prescription medicines only as told by your doctor. ? Medicated creams and medicines may be used as told.  Exercise often. Ask your doctor how much and what kind of exercise is best for you.  Go to the bathroom when you have the urge to poop. Do not wait.  Avoid pushing too hard when you poop.  Keep your butt dry and clean. Use wet toilet paper or moist towelettes after pooping.  Do not sit on the toilet for a long time.  Keep all follow-up visits as told by your doctor. This is important. Contact a doctor if you:  Have pain and swelling that do not  get better with treatment or medicine.  Have trouble pooping.  Cannot poop.  Have pain or swelling outside the area of the hemorrhoids. Get help right away if you have:  Bleeding that will not stop. Summary  Hemorrhoids are swollen veins in the butt or around the opening of the butt.  They can cause pain, itching, or bleeding.  Eat foods that have a lot of fiber in them. These include whole grains, beans, nuts, fruits, and vegetables.  Take a warm-water bath (sitz bath) for 20 minutes to ease pain. Do this 3-4 times a day. This information is not intended to replace advice given to you by your health care provider. Make sure you discuss any questions you have with your health care provider. Document Released: 02/17/2008 Document Revised: 09/29/2017 Document Reviewed: 09/29/2017 Elsevier Interactive Patient Education  2019 [employer].

## 2018-07-06 DIAGNOSIS — I871 Compression of vein: Secondary | ICD-10-CM | POA: Diagnosis not present

## 2018-07-06 DIAGNOSIS — N186 End stage renal disease: Secondary | ICD-10-CM | POA: Diagnosis not present

## 2018-07-06 DIAGNOSIS — Z992 Dependence on renal dialysis: Secondary | ICD-10-CM | POA: Diagnosis not present

## 2018-07-07 DIAGNOSIS — N186 End stage renal disease: Secondary | ICD-10-CM | POA: Diagnosis not present

## 2018-07-07 DIAGNOSIS — N2581 Secondary hyperparathyroidism of renal origin: Secondary | ICD-10-CM | POA: Diagnosis not present

## 2018-07-07 DIAGNOSIS — D631 Anemia in chronic kidney disease: Secondary | ICD-10-CM | POA: Diagnosis not present

## 2018-07-07 DIAGNOSIS — E8770 Fluid overload, unspecified: Secondary | ICD-10-CM | POA: Diagnosis not present

## 2018-07-07 DIAGNOSIS — E1129 Type 2 diabetes mellitus with other diabetic kidney complication: Secondary | ICD-10-CM | POA: Diagnosis not present

## 2018-07-08 DIAGNOSIS — N186 End stage renal disease: Secondary | ICD-10-CM | POA: Diagnosis not present

## 2018-07-08 DIAGNOSIS — E8770 Fluid overload, unspecified: Secondary | ICD-10-CM | POA: Diagnosis not present

## 2018-07-08 DIAGNOSIS — D631 Anemia in chronic kidney disease: Secondary | ICD-10-CM | POA: Diagnosis not present

## 2018-07-08 DIAGNOSIS — N2581 Secondary hyperparathyroidism of renal origin: Secondary | ICD-10-CM | POA: Diagnosis not present

## 2018-07-08 DIAGNOSIS — E1129 Type 2 diabetes mellitus with other diabetic kidney complication: Secondary | ICD-10-CM | POA: Diagnosis not present

## 2018-07-09 ENCOUNTER — Encounter (HOSPITAL_COMMUNITY): Payer: Self-pay | Admitting: *Deleted

## 2018-07-09 ENCOUNTER — Emergency Department (HOSPITAL_COMMUNITY): Payer: Medicare Other

## 2018-07-09 ENCOUNTER — Emergency Department (HOSPITAL_COMMUNITY)
Admission: EM | Admit: 2018-07-09 | Discharge: 2018-07-09 | Disposition: A | Discharge status: Home / Self Care | Payer: Medicare Other | Attending: Emergency Medicine | Admitting: Emergency Medicine

## 2018-07-09 DIAGNOSIS — E1122 Type 2 diabetes mellitus with diabetic chronic kidney disease: Secondary | ICD-10-CM | POA: Diagnosis not present

## 2018-07-09 DIAGNOSIS — Z992 Dependence on renal dialysis: Secondary | ICD-10-CM | POA: Diagnosis not present

## 2018-07-09 DIAGNOSIS — Z7901 Long term (current) use of anticoagulants: Secondary | ICD-10-CM | POA: Insufficient documentation

## 2018-07-09 DIAGNOSIS — I132 Hypertensive heart and chronic kidney disease with heart failure and with stage 5 chronic kidney disease, or end stage renal disease: Secondary | ICD-10-CM | POA: Diagnosis not present

## 2018-07-09 DIAGNOSIS — Z79899 Other long term (current) drug therapy: Secondary | ICD-10-CM | POA: Insufficient documentation

## 2018-07-09 DIAGNOSIS — K6289 Other specified diseases of anus and rectum: Secondary | ICD-10-CM | POA: Diagnosis not present

## 2018-07-09 DIAGNOSIS — K625 Hemorrhage of anus and rectum: Secondary | ICD-10-CM | POA: Insufficient documentation

## 2018-07-09 DIAGNOSIS — I2510 Atherosclerotic heart disease of native coronary artery without angina pectoris: Secondary | ICD-10-CM | POA: Diagnosis not present

## 2018-07-09 DIAGNOSIS — I5042 Chronic combined systolic (congestive) and diastolic (congestive) heart failure: Secondary | ICD-10-CM | POA: Insufficient documentation

## 2018-07-09 DIAGNOSIS — I959 Hypotension, unspecified: Secondary | ICD-10-CM | POA: Diagnosis not present

## 2018-07-09 DIAGNOSIS — N289 Disorder of kidney and ureter, unspecified: Secondary | ICD-10-CM | POA: Diagnosis not present

## 2018-07-09 DIAGNOSIS — N186 End stage renal disease: Secondary | ICD-10-CM | POA: Diagnosis not present

## 2018-07-09 LAB — CBC WITH DIFFERENTIAL/PLATELET
Abs Immature Granulocytes: 0.06 10*3/uL (ref 0.00–0.07)
Basophils Absolute: 0 10*3/uL (ref 0.0–0.1)
Basophils Relative: 1 %
Eosinophils Absolute: 0.3 10*3/uL (ref 0.0–0.5)
Eosinophils Relative: 4 %
HCT: 36.5 % — ABNORMAL LOW (ref 39.0–52.0)
Hemoglobin: 11.1 g/dL — ABNORMAL LOW (ref 13.0–17.0)
Immature Granulocytes: 1 %
Lymphocytes Relative: 21 %
Lymphs Abs: 1.5 10*3/uL (ref 0.7–4.0)
MCH: 30.4 pg (ref 26.0–34.0)
MCHC: 30.4 g/dL (ref 30.0–36.0)
MCV: 100 fL (ref 80.0–100.0)
Monocytes Absolute: 0.8 10*3/uL (ref 0.1–1.0)
Monocytes Relative: 12 %
Neutro Abs: 4.4 10*3/uL (ref 1.7–7.7)
Neutrophils Relative %: 61 %
Platelets: 220 10*3/uL (ref 150–400)
RBC: 3.65 MIL/uL — ABNORMAL LOW (ref 4.22–5.81)
RDW: 17.3 % — ABNORMAL HIGH (ref 11.5–15.5)
WBC: 7 10*3/uL (ref 4.0–10.5)
nRBC: 0.3 % — ABNORMAL HIGH (ref 0.0–0.2)

## 2018-07-09 LAB — BASIC METABOLIC PANEL
Anion gap: 18 — ABNORMAL HIGH (ref 5–15)
BUN: 32 mg/dL — ABNORMAL HIGH (ref 6–20)
CO2: 23 mmol/L (ref 22–32)
Calcium: 8.9 mg/dL (ref 8.9–10.3)
Chloride: 95 mmol/L — ABNORMAL LOW (ref 98–111)
Creatinine, Ser: 7.64 mg/dL — ABNORMAL HIGH (ref 0.61–1.24)
GFR calc Af Amer: 8 mL/min — ABNORMAL LOW (ref >60)
GFR calc non Af Amer: 7 mL/min — ABNORMAL LOW (ref >60)
Glucose, Bld: 84 mg/dL (ref 70–99)
Potassium: 4.4 mmol/L (ref 3.5–5.1)
Sodium: 136 mmol/L (ref 135–145)

## 2018-07-09 LAB — PROTIME-INR
INR: 2.23
Prothrombin Time: 24.4 seconds — ABNORMAL HIGH (ref 11.4–15.2)

## 2018-07-09 MED ORDER — LIDOCAINE HCL 4 % EX SOLN
Freq: Once | CUTANEOUS | Status: AC
  Administered 2018-07-09: 05:00:00 via TOPICAL
  Filled 2018-07-09: qty 50

## 2018-07-09 MED ORDER — ONDANSETRON HCL 4 MG/2ML IJ SOLN
4.0000 mg | Freq: Once | INTRAMUSCULAR | Status: AC
  Administered 2018-07-09: 4 mg via INTRAVENOUS
  Filled 2018-07-09: qty 2

## 2018-07-09 MED ORDER — LIDOCAINE (ANORECTAL) 5 % EX CREA: 1.0000 "application " | TOPICAL_CREAM | Freq: Four times a day (QID) | CUTANEOUS | 0 refills | Status: DC | PRN

## 2018-07-09 MED ORDER — IOHEXOL 300 MG/ML  SOLN
100.0000 mL | Freq: Once | INTRAMUSCULAR | Status: AC | PRN
  Administered 2018-07-09: 100 mL via INTRAVENOUS

## 2018-07-09 MED ORDER — OXYCODONE-ACETAMINOPHEN 5-325 MG PO TABS
1.0000 | ORAL_TABLET | Freq: Once | ORAL | Status: AC
  Administered 2018-07-09: 1 via ORAL
  Filled 2018-07-09: qty 1

## 2018-07-09 NOTE — ED Triage Notes (Signed)
Pt c/o hemorrhoids for the past week. Has noticed blood in the toilet at times. Bp noted to be hypotensive, pt reports this is baseline for him

## 2018-07-09 NOTE — ED Notes (Signed)
ED Provider at bedside. 

## 2018-07-09 NOTE — ED Notes (Signed)
Patient verbalized understanding of discharge instructions and denies any further needs or questions at this time. VS stable. Patient ambulatory with steady gait. Patient requested to walk to ED lobby for comfort (hurts to sit) - RN accompanied to ED entrance.

## 2018-07-09 NOTE — ED Provider Notes (Signed)
[facility]ERGENCY DEPARTMENT Provider Note   CSN: [id number] Arrival date & time: 07/09/18  0357     History   Chief Complaint Chief Complaint  Patient presents with  . Hemorrhoids    HPI [name] is a 58 y.o. male.  Patient presents to the emergency department for evaluation of rectal pain and rectal bleeding.  He has been experiencing pain and noticing small amounts of blood in the toilet after bowel movement and bright red blood on tissue when he wipes for several days.  He saw his primary doctor when this started, was given Anusol HC suppositories.  This has not helped.  He reports that the pain is worsening, now having trouble sitting down because of pain.  He has not had any constipation.  He denies melanotic stools.  He has not been experiencing diarrhea.  No nausea, vomiting, fever.     Past Medical History:  Diagnosis Date  . Atrial flutter ([place])   . 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 Cardiomyopathy   . CHF (congestive heart failure) ([place])   . Coronary artery disease    normal coronaries by 10/10/08 cath, Cardiac Cath 08-04-12 epic.Dr. Doylene Canard follows  . Diabetes mellitus    pt. states he's borderline diabetic., no longer taking med,- off med. since 2013  . Dialysis patient [facility])    Mon-Wed-Fri([place])- Left AV fistula  . Diverticulitis November 2016   reoccurred in December 2016  . ESRD (end stage renal disease) ([place])   . GERD (gastroesophageal reflux disease)   . Gout   . History of nephrectomy 07/04/2012   History of right nephrectomy in 2002 for renal cell carcinoma   . History of unilateral nephrectomy   . Hypertension   . Low iron   . Myocardial infarction [facility]) ?2006  . Renal cell carcinoma    dialysis- MWF- Dr. Mercy Moore follows.  . Renal insufficiency   . Shortness of breath 05/19/11   "at rest, lying down, w/exertion"  . Stroke [facility]) 02/2011   05/19/11 denies residual  . Umbilical  hernia 74/94/49   unrepaired    Patient Active Problem List   Diagnosis Date Noted  . Acute lower GI bleeding 06/21/2018  . Supratherapeutic INR 06/20/2018  . Melena 06/20/2018  . Malnutrition of moderate degree 02/10/2018  . Anemia 01/27/2018  . HCAP (healthcare-associated pneumonia) 01/26/2018  . CAD (coronary artery disease) 01/26/2018  . Atrial fibrillation, chronic 01/26/2018  . Depression 01/26/2018  . GERD (gastroesophageal reflux disease) 01/26/2018  . Pulmonary embolism ([place]) 01/26/2018  . History of atrial flutter 01/13/2018  . Hemorrhage of left kidney 01/13/2018  . Acute respiratory failure with hypoxemia ([place]) 07/17/2017  . 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  . Acute on chronic combined systolic and diastolic CHF, NYHA class 4 ([place]) 07/17/2017  . Unresponsiveness 03/16/2017  . Hypotension 03/16/2017  . Paroxysmal atrial flutter ([place]) 03/16/2017  . Chronic pain 03/16/2017  . Leg pain, bilateral 03/16/2017  . Bile leak, postoperative 03/15/2016  . Elevated troponin 03/15/2016  . Rib pain on right side   . Biliary dyskinesia 02/12/2016  . Atrial flutter with rapid ventricular response ([place]) 06/27/2015  . Diverticulitis 04/21/2015  . Acute diverticulitis 04/21/2015  . Diverticulitis of intestine without perforation or abscess without bleeding 04/21/2015  . Pulmonary edema 01/13/2015  . Right upper quadrant abdominal pain 01/13/2015  . Acute respiratory failure with hypoxia ([place]) 01/13/2015  . Diverticulitis large intestine w/o perforation or  abscess w/o bleeding 12/18/2014  . Hepatic cyst 12/18/2014  . Aftercare following surgery of the circulatory system, [place] 06/22/2013  . ESRD (end stage renal disease) on dialysis ([place]) 11/28/2012  . Right sided weakness 10/05/2012  . History of nephrectomy 07/04/2012  . End-stage renal disease on hemodialysis ([place]) 06/04/2011  . Chronic combined systolic (congestive) and diastolic (congestive) heart failure ([place]) 05/19/2011    Class:  Acute  . Hypertension, benign 05/19/2011    Class: Chronic  . DM II (diabetes mellitus, type II), controlled ([place]) 05/19/2011    Class: Chronic  . Gout, chronic 05/19/2011    Past Surgical History:  Procedure Laterality Date  . AV FISTULA PLACEMENT  03/29/2011   Procedure: ARTERIOVENOUS (AV) FISTULA CREATION;  Surgeon: Hinda Lenis, MD;  Location: [facility];  Service: Vascular;  Laterality: Left;  LEFT RADIOCEPHALIC , Arteriovenous JIRCVEL([id number])  . CARDIAC CATHETERIZATION  05/21/11  . CARDIAC CATHETERIZATION N/A 03/16/2016   Procedure: Left Heart Cath and Coronary Angiography;  Surgeon: Dixie Dials, MD;  Location: [facility] CV LAB;  Service: Cardiovascular;  Laterality: N/A;  . CHOLECYSTECTOMY N/A 02/12/2016   Procedure: LAPAROSCOPIC CHOLECYSTECTOMY WITH INTRAOPERATIVE CHOLANGIOGRAM;  Surgeon: Jackolyn Confer, MD;  Location: [facility];  Service: General;  Laterality: N/A;  . COLONOSCOPY WITH PROPOFOL N/A 02/01/2017   Procedure: COLONOSCOPY WITH PROPOFOL;  Surgeon: Juanita Craver, MD;  Location: [facility];  Service: Endoscopy;  Laterality: N/A;  . dialysis cath placed    . ESOPHAGOGASTRODUODENOSCOPY (EGD) WITH PROPOFOL N/A 12/02/2015   Procedure: ESOPHAGOGASTRODUODENOSCOPY (EGD) WITH PROPOFOL;  Surgeon: Juanita Craver, MD;  [facility];  Service: Endoscopy;  Laterality: N/A;  . FINGER SURGERY     L pinkie finger- ORIF- /w remaining hardware - 1990's    . HERNIA REPAIR N/A 7/51/0258   Umbilical hernia repair  . INSERTION OF DIALYSIS CATHETER N/A 01/27/2016   Procedure: INSERTION OF DIALYSIS CATHETER;  Surgeon: Waynetta Sandy, MD;  Location: [place];  Service: Vascular;  Laterality: N/A;  . INSERTION OF MESH N/A 07/04/2012   Procedure: INSERTION OF MESH;  Surgeon: Madilyn Hook, DO;  Location: [facility];  Service: General;  Laterality: N/A;  . IR EMBO ART  VEN HEMORR LYMPH EXTRAV  INC GUIDE ROADMAPPING  01/13/2018  . IR FLUORO GUIDE CV LINE RIGHT  01/13/2018  . IR IVC FILTER PLMT /  S&I /IMG GUID/MOD SED  01/27/2018  . IR RENAL SELECTIVE  UNI INC S&I MOD SED  01/13/2018  . IR US GUIDE VASC ACCESS RIGHT  01/13/2018  . LAPAROSCOPIC LYSIS OF ADHESIONS N/A 02/12/2016   Procedure: LAPAROSCOPIC LYSIS OF ADHESIONS;  Surgeon: Jackolyn Confer, MD;  Location: [facility];  Service: General;  Laterality: N/A;  . LEFT AND RIGHT HEART CATHETERIZATION WITH CORONARY ANGIOGRAM N/A 08/04/2012   Procedure: LEFT AND RIGHT HEART CATHETERIZATION WITH CORONARY ANGIOGRAM;  Surgeon: Birdie Riddle, MD;  Location: [facility] CATH LAB;  Service: Cardiovascular;  Laterality: N/A;  . NEPHRECTOMY  2000   right  . REVISON OF ARTERIOVENOUS FISTULA Left 06/05/2013   Procedure: REVISON OF LEFT RADIOCEPHALIC  ARTERIOVENOUS FISTULA;  Surgeon: Conrad [place], MD;  Location: [facility];  Service: Vascular;  Laterality: Left;  . REVISON OF ARTERIOVENOUS FISTULA Left 01/27/2016   Procedure: REVISION OF LEFT UPPER EXTREMITY ARTERIOVENOUS FISTULA;  Surgeon: Waynetta Sandy, MD;  Location: [facility];  Service: Vascular;  Laterality: Left;  . REVISON OF ARTERIOVENOUS FISTULA Left 08/03/2016   Procedure: REVISON OF Left arm ARTERIOVENOUS FISTULA;  Surgeon: Elam Dutch, MD;  Location: [facility];  Service: Vascular;  Laterality: Left;  . REVISON OF ARTERIOVENOUS FISTULA Left 05/06/2017   Procedure: REVISION PLICATION OF ARTERIOVENOUS FISTULA LEFT ARM;  Surgeon: Rosetta Posner, MD;  Location: [place];  Service: Vascular;  Laterality: Left;  . RIGHT HEART CATHETERIZATION N/A 05/21/2011   Procedure: RIGHT HEART CATH;  Surgeon: Birdie Riddle, MD;  Location: [facility] CATH LAB;  Service: Cardiovascular;  Laterality: N/A;  . smashed  1990's   "left pinky; have a plate in there"  . UMBILICAL HERNIA REPAIR N/A 07/04/2012   Procedure: LAPAROSCOPIC UMBILICAL HERNIA;  Surgeon: Madilyn Hook, DO;  Location: [facility];  Service: General;  Laterality: N/A;  . US ECHOCARDIOGRAPHY  05/20/11        Home Medications    Prior to Admission medications   Medication  Sig Start Date End Date Taking? Authorizing Provider  allopurinol (ZYLOPRIM) 300 MG tablet Take 150 mg by mouth every other day.    [provider]  Amino Acids-Protein Hydrolys (FEEDING SUPPLEMENT, PRO-STAT SUGAR FREE 64,) LIQD Take 30 mLs by mouth 2 (two) times daily. 02/11/18   Desiree Hane, MD  amiodarone (PACERONE) 200 MG tablet Take 0.5 tablets (100 mg total) by mouth daily. 07/21/17   Regalado, Belkys A, MD  calcium acetate (PHOSLO) 667 MG capsule Take 667-2,001 mg by mouth See admin instructions. Take 3 capsules with meals and 1 capsule with snacks. 01/24/18   [provider]  chlorhexidine (PERIDEX) 0.12 % solution 15 mLs by Mouth Rinse route daily as needed. Mouth rinse 05/29/18   [provider]  doxercalciferol (HECTOROL) 4 MCG/2ML injection Inject 1.5 mLs (3 mcg total) into the vein every Monday, Wednesday, and Friday with hemodialysis. 06/23/18   Dixie Dials, MD  ferric citrate (AURYXIA) 1 GM 210 MG(Fe) tablet Take 210 mg by mouth 3 (three) times daily with meals.     [provider]  hydrocortisone (ANUSOL-HC) 25 MG suppository Place 1 suppository (25 mg total) rectally every 12 (twelve) hours. 07/05/18 07/05/19  Rodriguez-Southworth, Sunday Spillers, PA-C  isosorbide dinitrate (ISORDIL) 20 MG tablet Take 20 mg by mouth 2 (two) times daily.    [provider]  lidocaine-prilocaine (EMLA) cream Apply 1 application topically See admin instructions. Apply topically to port access prior to dialysis - Monday, Wednesday, Friday 04/08/15   [provider]  midodrine (PROAMATINE) 10 MG tablet Take 0.5-1 tablets (5-10 mg total) by mouth See admin instructions. Take one tablet (10 mg) by mouth before dialysis (Monday, Wednesday, Friday), take 1/2 tablet (5 mg) on non-dialysis days (Sunday, Tuesday, Thursday, Saturday) as needed for diastolic blood pressure <50. Patient taking differently: Take 10 mg by mouth See admin instructions. Take one tablet (10 mg) by  mouth at 5AM on Dialysis days (M,W,F), 10mg  at 5:30AM on Dialysis days (M,W,F), and 10mg  at 6AM on Dialysis days (M,W,F) 01/23/18   Roxan Hockey, MD  mirtazapine (REMERON) 7.5 MG tablet Take 1 tablet (7.5 mg total) by mouth at bedtime. 01/23/18   Roxan Hockey, MD  multivitamin (RENA-VIT) TABS tablet Take 1 tablet by mouth at bedtime.    [provider]  nitroGLYCERIN (NITROSTAT) 0.4 MG SL tablet Place 0.4 mg under the tongue every 5 (five) minutes as needed for chest pain.  05/20/15   [provider]  pantoprazole (PROTONIX) 40 MG tablet Take 40 mg by mouth 2 (two) times daily.    [provider]  warfarin (COUMADIN) 10 MG tablet Take 1 tablet (10 mg total) by mouth daily  at 6 PM. 06/22/18   Dixie Dials, MD    Family History Family History  Problem Relation Age of Onset  . Hypertension Mother   . Kidney disease Mother     Social History Social History   Tobacco Use  . Smoking status: Never Smoker  . Smokeless tobacco: Never Used  Substance Use Topics  . Alcohol use: No    Alcohol/week: 0.0 standard drinks  . Drug use: No    Types: Cocaine    Comment: Past hx-none in 20 yrs     Allergies   Ace inhibitors   Review of Systems Review of Systems  Gastrointestinal: Positive for blood in stool and rectal pain.  All other systems reviewed and are negative.    Physical Exam Updated Vital Signs BP (!) 81/66 (BP Location: Right Arm)   Pulse (!) 112   Temp 97.7 F (36.5 C) (Oral)   Resp 18   SpO2 99%   Physical Exam Vitals signs and nursing note reviewed.  Constitutional:      General: He is not in acute distress.    Appearance: Normal appearance. He is well-developed.  HENT:     Head: Normocephalic and atraumatic.     Right Ear: Hearing normal.     Left Ear: Hearing normal.     Nose: Nose normal.  Eyes:     Conjunctiva/sclera: Conjunctivae normal.     Pupils: Pupils are equal, round, and reactive to light.  Neck:      Musculoskeletal: Normal range of motion and neck supple.  Cardiovascular:     Rate and Rhythm: Regular rhythm.     Heart sounds: S1 normal and S2 normal. No murmur. No friction rub. No gallop.   Pulmonary:     Effort: Pulmonary effort is normal. No respiratory distress.     Breath sounds: Normal breath sounds.  Chest:     Chest wall: No tenderness.  Abdominal:     General: Bowel sounds are normal.     Palpations: Abdomen is soft.     Tenderness: There is no abdominal tenderness. There is no guarding or rebound. Negative signs include Murphy's sign and McBurney's sign.     Hernia: No hernia is present.  Genitourinary:    Comments: Redundant tissue noted at 12:00 that is very tender to the touch, no thrombosed external hemorrhoid.  Digital rectal not possible secondary to pain. Musculoskeletal: Normal range of motion.  Skin:    General: Skin is warm and dry.     Findings: No rash.  Neurological:     Mental Status: He is alert and oriented to person, place, and time.     GCS: GCS eye subscore is 4. GCS verbal subscore is 5. GCS motor subscore is 6.     Cranial Nerves: No cranial nerve deficit.     Sensory: No sensory deficit.     Coordination: Coordination normal.  Psychiatric:        Speech: Speech normal.        Behavior: Behavior normal.        Thought Content: Thought content normal.      ED Treatments / Results  Labs (all labs ordered are listed, but only abnormal results are displayed) Labs Reviewed  CBC WITH DIFFERENTIAL/PLATELET - Abnormal; Notable for the following components:      Result Value   RBC 3.65 (*)    Hemoglobin 11.1 (*)    HCT 36.5 (*)    RDW 17.3 (*)    nRBC 0.3 (*)  All other components within normal limits  BASIC METABOLIC PANEL - Abnormal; Notable for the following components:   Chloride 95 (*)    BUN 32 (*)    Creatinine, Ser 7.64 (*)    GFR calc non Af Amer 7 (*)    GFR calc Af Amer 8 (*)    Anion gap 18 (*)    All other components within  normal limits  PROTIME-INR - Abnormal; Notable for the following components:   Prothrombin Time 24.4 (*)    All other components within normal limits    EKG None  Radiology No results found.  Procedures Procedures (including critical care time)  Medications Ordered in ED Medications  lidocaine (XYLOCAINE) 4 % external solution ( Topical Given 07/09/18 0449)  oxyCODONE-acetaminophen (PERCOCET/ROXICET) 5-325 MG per tablet 1 tablet (1 tablet Oral Given 07/09/18 0447)     Initial Impression / Assessment and Plan / ED Course  I have reviewed the triage vital signs and the nursing notes.  Pertinent labs & imaging results that were available during my care of the patient were reviewed by me and considered in my medical decision making (see chart for details).     Patient presents to the emergency department with rectal pain.  He was seen by his primary doctor and given Anusol HC suppositories for external hemorrhoid.  On examination he does have redundant tissue in the area that they were looking at as possible hemorrhoid, but it is not convincing that this is a hemorrhoid and certainly is not thrombosed.  He does have significant tenderness in this area but there does not appear to be anything that would suggest abscess.  Patient is hypotensive at arrival.  He reports that this is normal for him.  Reviewing records does reveal hypotension persistently and he is on milrinone for this.  Labs were obtained.  He does not have anemia and his INR is therapeutic.  Will obtain CT abdomen and pelvis to rule out proctitis, perirectal/pelvic abscess.  If negative, patient will be discharged with analgesia, topical lidocaine, increased stool softener and follow-up with general surgery.  Final Clinical Impressions(s) / ED Diagnoses   Final diagnoses:  Rectal pain  Bright red blood per rectum    ED Discharge Orders    None       Orpah Greek, MD 07/09/18 0630

## 2018-07-09 NOTE — ED Notes (Signed)
Patient returned from CT - states the contrast made him very hot and nauseated - vomited x 1, also c/o abdominal discomfort.

## 2018-07-09 NOTE — ED Provider Notes (Signed)
Received care from Dr. Betsey Holiday. 58yo male with history of ESRD, chronic hypotension, presents with rectal pain and rectal bleeding.  Hgb stable. VS at baseline. CT pending.  CT without acute abnormalities.  Discussed results. Suspect likely anal pathology, recommend surgery and GI follow up.    Matthew Morgan, MD 07/09/18 2204

## 2018-07-10 DIAGNOSIS — E1129 Type 2 diabetes mellitus with other diabetic kidney complication: Secondary | ICD-10-CM | POA: Diagnosis not present

## 2018-07-10 DIAGNOSIS — N2581 Secondary hyperparathyroidism of renal origin: Secondary | ICD-10-CM | POA: Diagnosis not present

## 2018-07-10 DIAGNOSIS — N186 End stage renal disease: Secondary | ICD-10-CM | POA: Diagnosis not present

## 2018-07-10 DIAGNOSIS — E8770 Fluid overload, unspecified: Secondary | ICD-10-CM | POA: Diagnosis not present

## 2018-07-10 DIAGNOSIS — D631 Anemia in chronic kidney disease: Secondary | ICD-10-CM | POA: Diagnosis not present

## 2018-07-11 ENCOUNTER — Other Ambulatory Visit: Payer: Self-pay

## 2018-07-11 ENCOUNTER — Ambulatory Visit (INDEPENDENT_AMBULATORY_CARE_PROVIDER_SITE_OTHER): Payer: Medicare Other | Admitting: Internal Medicine

## 2018-07-11 ENCOUNTER — Encounter: Payer: Self-pay | Admitting: Internal Medicine

## 2018-07-11 VITALS — BP 96/56 | HR 63 | Temp 97.7°F | Ht 67.0 in | Wt 238.8 lb

## 2018-07-11 DIAGNOSIS — Z992 Dependence on renal dialysis: Secondary | ICD-10-CM | POA: Diagnosis not present

## 2018-07-11 DIAGNOSIS — Z09 Encounter for follow-up examination after completed treatment for conditions other than malignant neoplasm: Secondary | ICD-10-CM

## 2018-07-11 DIAGNOSIS — I953 Hypotension of hemodialysis: Secondary | ICD-10-CM

## 2018-07-11 DIAGNOSIS — K602 Anal fissure, unspecified: Secondary | ICD-10-CM | POA: Diagnosis not present

## 2018-07-11 NOTE — Patient Instructions (Signed)
Hypotension As your heart beats, it forces blood through your body. This force is called blood pressure. If you have hypotension, you have low blood pressure. When your blood pressure is too low, you may not get enough blood to your brain or other parts of your body. This may cause you to feel weak, light-headed, have a fast heartbeat, or even pass out (faint). Low blood pressure may be harmless, or it may cause serious problems. What are the causes?  Blood loss.  Not enough water in the body (dehydration).  Heart problems.  Hormone problems.  Pregnancy.  A very bad infection.  Not having enough of certain nutrients.  Very bad allergic reactions.  Certain medicines. What increases the risk?  Age. The risk increases as you get older.  Conditions that affect the heart or the brain and spinal cord (central nervous system).  Taking certain medicines.  Being pregnant. What are the signs or symptoms?  Feeling: ? Weak. ? Light-headed. ? Dizzy. ? Tired (fatigued).  Blurred vision.  Fast heartbeat.  Passing out, in very bad cases. How is this treated?  Changing your diet. This may involve eating more salt (sodium) or drinking more water.  Taking medicines to raise your blood pressure.  Changing how much you take (the dosage) of some of your medicines.  Wearing compression stockings. These stockings help to prevent blood clots and reduce swelling in your legs. In some cases, you may need to go to the hospital for:  Fluid replacement. This means you will receive fluids through an IV tube.  Blood replacement. This means you will receive donated blood through an IV tube (transfusion).  Treating an infection or heart problems, if this applies.  Monitoring. You may need to be monitored while medicines that you are taking wear off. Follow these instructions at home: Eating and drinking   Drink enough fluids to keep your pee (urine) pale yellow.  Eat a healthy diet.  Follow instructions from your doctor about what you can eat or drink. A healthy diet includes: ? Fresh fruits and vegetables. ? Whole grains. ? Low-fat (lean) meats. ? Low-fat dairy products.  Eat extra salt only as told. Do not add extra salt to your diet unless your doctor tells you to.  Eat small meals often.  Avoid standing up quickly after you eat. Medicines  Take over-the-counter and prescription medicines only as told by your doctor. ? Follow instructions from your doctor about changing how much you take of your medicines, if this applies. ? Do not stop or change any of your medicines on your own. General instructions   Wear compression stockings as told by your doctor.  Get up slowly from lying down or sitting.  Avoid hot showers and a lot of heat as told by your doctor.  Return to your normal activities as told by your doctor. Ask what activities are safe for you.  Do not use any products that contain nicotine or tobacco, such as cigarettes, e-cigarettes, and chewing tobacco. If you need help quitting, ask your doctor.  Keep all follow-up visits as told by your doctor. This is important. Contact a doctor if:  You throw up (vomit).  You have watery poop (diarrhea).  You have a fever for more than 2-3 days.  You feel more thirsty than normal.  You feel weak and tired. Get help right away if:  You have chest pain.  You have a fast or uneven heartbeat.  You lose feeling (have numbness) in any   part of your body.  You cannot move your arms or your legs.  You have trouble talking.  You get sweaty or feel light-headed.  You pass out.  You have trouble breathing.  You have trouble staying awake.  You feel mixed up (confused). Summary  Hypotension is also called low blood pressure. It is when the force of blood pumping through your arteries is too weak.  Hypotension may be harmless, or it may cause serious problems.  Treatment may include changing  your diet and medicines, and wearing compression stockings.  In very bad cases, you may need to go to the hospital. This information is not intended to replace advice given to you by your health care provider. Make sure you discuss any questions you have with your health care provider. Document Released: 08/04/2009 Document Revised: 11/03/2017 Document Reviewed: 11/03/2017 Elsevier Interactive Patient Education  2019 [place], Adult  An anal fissure is a small tear or crack in the tissue around the opening of the butt (anus). Bleeding from the tear or crack usually stops on its own within a few minutes. The bleeding may happen every time you poop (have a bowel movement) until the tear or crack heals. What are the causes? This condition is usually caused by passing a large or hard poop (stool). Other causes include:  Trouble pooping (constipation).  Passing watery poop (diarrhea).  Inflammatory bowel disease (Crohn's disease or ulcerative colitis).  Childbirth.  Infections.  Anal sex. What are the signs or symptoms? Symptoms of this condition include:  Bleeding from the butt.  Small amounts of blood on your poop. The blood coats the outside of the poop. It is not mixed with the poop.  Small amounts of blood on the toilet paper or in the toilet after you poop.  Pain when passing poop.  Itching or irritation around the opening of the butt. How is this diagnosed? This condition may be diagnosed based on a physical exam. Your doctor may:  Check your butt. A tear can often be seen by checking the area with care.  Check your butt using a short tube (anoscope). The light in the tube will show any problems in your butt. How is this treated? Treatment for this condition may include:  Treating problems that make it hard for you to pass poop. You may be told to: ? Eat more fiber. ? Drink more fluid. ? Take fiber supplements. ? Take medicines that make poop  soft.  Taking sitz baths. This may help to heal the tear.  Using creams and ointments. If your condition gets worse, other treatments may be needed such as:  A shot near the tear or crack (botulinum injection).  Surgery to repair the tear or crack. Follow these instructions at home: Eating and drinking   Avoid bananas and dairy products. These foods can make it hard to poop.  Drink enough fluid to keep your pee (urine) pale yellow.  Eat foods that have a lot of fiber in them, such as: ? Beans. ? Whole grains. ? Fresh fruits. ? Fresh vegetables. General instructions   Take over-the-counter and prescription medicines only as told by your doctor.  Use creams or ointments only as told by your doctor.  Keep the butt area as clean and dry as you can.  Take a warm water bath (sitz bath) as told by your doctor. Do not use soap.  Keep all follow-up visits as told by your doctor. This is important. Contact a doctor  if:  You have more bleeding.  You have a fever.  You have watery poop that is mixed with blood.  You have pain.  Your problem gets worse, not better. Summary  An anal fissure is a small tear or crack in the skin around the opening of the butt (anus).  This condition is usually caused by passing a large or hard poop (stool).  Treatment includes treating the problems that make it hard for you to pass poop.  Follow your doctor's instructions about caring for your condition at home.  Keep all follow-up visits as told by your doctor. This is important. This information is not intended to replace advice given to you by your health care provider. Make sure you discuss any questions you have with your health care provider. Document Released: 01/06/2011 Document Revised: 10/20/2017 Document Reviewed: 10/20/2017 Elsevier Interactive Patient Education  2019 [employer].

## 2018-07-11 NOTE — Progress Notes (Signed)
Subjective:     Patient ID: [name] , male    DOB: [birth year] , 58 y.o.   MRN: [id number]   Chief Complaint  Patient presents with  . Rectal Bleeding    still bleeding/ dark red/     HPI  Pt is here for ER FU. States he ended up there due to unchanged rectal pain after  2 days of trying my recommendation with anusol supository, preparation H cream, sits baths and stool softner. Had a neg CT. Was diagnosed with fissure. Has been using lidocaine cream prescribed  for pain in the ER.  Has had mild bleeding off and on with passing stools.  Today he has been feeling a little more light headed than normal. Has not had dialysis since last week and next one is due tomorrow.    Past Medical History:  Diagnosis Date  . Atrial flutter ([place])   . 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 Cardiomyopathy   . CHF (congestive heart failure) ([place])   . Coronary artery disease    normal coronaries by 10/10/08 cath, Cardiac Cath 08-04-12 epic.Dr. Doylene Canard follows  . Diabetes mellitus    pt. states he's borderline diabetic., no longer taking med,- off med. since 2013  . Dialysis patient [facility])    Mon-Wed-Fri([place])- Left AV fistula  . Diverticulitis November 2016   reoccurred in December 2016  . ESRD (end stage renal disease) ([place])   . GERD (gastroesophageal reflux disease)   . Gout   . History of nephrectomy 07/04/2012   History of right nephrectomy in 2002 for renal cell carcinoma   . History of unilateral nephrectomy   . Hypertension   . Low iron   . Myocardial infarction [facility]) ?2006  . Renal cell carcinoma    dialysis- MWF- Dr. Mercy Moore follows.  . Renal insufficiency   . Shortness of breath 05/19/11   "at rest, lying down, w/exertion"  . Stroke [facility]) 02/2011   05/19/11 denies residual  . Umbilical hernia 76/73/41   unrepaired     Family History  Problem Relation Age of Onset  . Hypertension Mother   . Kidney disease Mother      Current  Outpatient Medications:  .  allopurinol (ZYLOPRIM) 300 MG tablet, Take 150 mg by mouth every other day., Disp: , Rfl:  .  Amino Acids-Protein Hydrolys (FEEDING SUPPLEMENT, PRO-STAT SUGAR FREE 64,) LIQD, Take 30 mLs by mouth 2 (two) times daily., Disp: 900 mL, Rfl: 0 .  amiodarone (PACERONE) 200 MG tablet, Take 0.5 tablets (100 mg total) by mouth daily., Disp: 30 tablet, Rfl: 1 .  calcium acetate (PHOSLO) 667 MG capsule, Take 667-2,001 mg by mouth See admin instructions. Take 3 capsules with meals and 1 capsule with snacks., Disp: , Rfl:  .  chlorhexidine (PERIDEX) 0.12 % solution, 15 mLs by Mouth Rinse route daily as needed. Mouth rinse, Disp: , Rfl:  .  doxercalciferol (HECTOROL) 4 MCG/2ML injection, Inject 1.5 mLs (3 mcg total) into the vein every Monday, Wednesday, and Friday with hemodialysis., Disp: , Rfl:  .  ferric citrate (AURYXIA) 1 GM 210 MG(Fe) tablet, Take 210 mg by mouth 3 (three) times daily with meals. , Disp: , Rfl:  .  hydrocortisone (ANUSOL-HC) 25 MG suppository, Place 1 suppository (25 mg total) rectally every 12 (twelve) hours., Disp: 12 suppository, Rfl: 1 .  isosorbide dinitrate (ISORDIL) 20 MG tablet, Take 20 mg by mouth 2 (two) times daily., Disp: ,  Rfl:  .  Lidocaine, Anorectal, (L-M-X 5) 5 % CREA, Apply 1 application topically 4 (four) times daily as needed (apply to area up to 6 times daily as needed)., Disp: 1 Tube, Rfl: 0 .  lidocaine-prilocaine (EMLA) cream, Apply 1 application topically See admin instructions. Apply topically to port access prior to dialysis - Monday, Wednesday, Friday, Disp: , Rfl: 12 .  midodrine (PROAMATINE) 10 MG tablet, Take 0.5-1 tablets (5-10 mg total) by mouth See admin instructions. Take one tablet (10 mg) by mouth before dialysis (Monday, Wednesday, Friday), take 1/2 tablet (5 mg) on non-dialysis days (Sunday, Tuesday, Thursday, Saturday) as needed for diastolic blood pressure <98. (Patient taking differently: Take 10 mg by mouth See admin  instructions. Take one tablet (10 mg) by mouth at 5AM on Dialysis days (M,W,F), 10mg  at 5:30AM on Dialysis days (M,W,F), and 10mg  at 6AM on Dialysis days (M,W,F)), Disp: 120 tablet, Rfl: 2 .  mirtazapine (REMERON) 7.5 MG tablet, Take 1 tablet (7.5 mg total) by mouth at bedtime., Disp: 30 tablet, Rfl: 2 .  multivitamin (RENA-VIT) TABS tablet, Take 1 tablet by mouth at bedtime., Disp: , Rfl:  .  nitroGLYCERIN (NITROSTAT) 0.4 MG SL tablet, Place 0.4 mg under the tongue every 5 (five) minutes as needed for chest pain. , Disp: , Rfl: 0 .  pantoprazole (PROTONIX) 40 MG tablet, Take 40 mg by mouth 2 (two) times daily., Disp: , Rfl:  .  warfarin (COUMADIN) 10 MG tablet, Take 1 tablet (10 mg total) by mouth daily at 6 PM., Disp: , Rfl:    Allergies  Allergen Reactions  . Ace Inhibitors Itching and Cough     Review of Systems  Constitutional: Negative for chills, diaphoresis and fever.  Respiratory: Negative for chest tightness and shortness of breath.   Cardiovascular: Negative for chest pain, palpitations and leg swelling.  Gastrointestinal: Positive for anal bleeding and blood in stool. Negative for abdominal distention, abdominal pain, constipation, diarrhea, nausea and vomiting.  Genitourinary: Negative for difficulty urinating.  Musculoskeletal: Negative for gait problem.  Skin: Negative for rash and wound.  Neurological: Positive for light-headedness. Negative for syncope and weakness.     Today's Vitals   07/11/18 1034  BP: (!) 96/56  Pulse: 63  Temp: 97.7 F (36.5 C)  TempSrc: Oral  SpO2: 97%  Weight: 238 lb 12.8 oz (108.3 kg)  Height: 5\' 7"  (1.702 m)   Body mass index is 37.4 kg/m.   Objective:  Physical Exam   Constitutional: She is oriented to person, place, and time. She appears well-developed and well-nourished. No distress.  HENT:  Head: Normocephalic and atraumatic.  Right Ear: External ear normal.  Left Ear: External ear normal.  Nose: Nose normal.  Eyes:  Conjunctivae are normal. Right eye exhibits no discharge. Left eye exhibits no discharge. No scleral icterus.  Neck: Neck supple. No thyromegaly present.  No carotid bruits bilaterally  Cardiovascular: RRR No murmur heard. Pulmonary/Chest: Effort normal and breath sounds normal. No respiratory distress.  Musculoskeletal: Normal range of motion.  Rectal- not done.   Assessment And Plan:  1. Rectal fissure- he is to continue recommendations I had him on and keep stools softs, and will see a general surgeon for FU  2. Hemodialysis-associated hypotension- I sent Dr Doylene Canard a message about his low BP, if he would recommends any adjustments of his dose.  I will check on pt tomorrow.  Fu as schedule for his chronic problems.    Arzella Rehmann RODRIGUEZ-SOUTHWORTH, PA-C

## 2018-07-12 ENCOUNTER — Telehealth: Payer: Self-pay

## 2018-07-12 ENCOUNTER — Telehealth: Payer: Self-pay | Admitting: Internal Medicine

## 2018-07-12 DIAGNOSIS — D689 Coagulation defect, unspecified: Secondary | ICD-10-CM | POA: Diagnosis not present

## 2018-07-12 DIAGNOSIS — N2581 Secondary hyperparathyroidism of renal origin: Secondary | ICD-10-CM | POA: Diagnosis not present

## 2018-07-12 DIAGNOSIS — E8770 Fluid overload, unspecified: Secondary | ICD-10-CM | POA: Diagnosis not present

## 2018-07-12 DIAGNOSIS — D631 Anemia in chronic kidney disease: Secondary | ICD-10-CM | POA: Diagnosis not present

## 2018-07-12 DIAGNOSIS — N186 End stage renal disease: Secondary | ICD-10-CM | POA: Diagnosis not present

## 2018-07-12 DIAGNOSIS — E1129 Type 2 diabetes mellitus with other diabetic kidney complication: Secondary | ICD-10-CM | POA: Diagnosis not present

## 2018-07-12 NOTE — Telephone Encounter (Signed)
Please call to check on how pt is doing regarding his dizziness. I sent a message to his heart Dr Wilburn Mylar and I have not heard back today.

## 2018-07-12 NOTE — Telephone Encounter (Signed)
Called to inform wife that referral had been put in for [place] surgery

## 2018-07-13 LAB — PROTIME-INR: INR: 1.5 — AB (ref 0.9–1.1)

## 2018-07-14 DIAGNOSIS — E1129 Type 2 diabetes mellitus with other diabetic kidney complication: Secondary | ICD-10-CM | POA: Diagnosis not present

## 2018-07-14 DIAGNOSIS — N2581 Secondary hyperparathyroidism of renal origin: Secondary | ICD-10-CM | POA: Diagnosis not present

## 2018-07-14 DIAGNOSIS — N186 End stage renal disease: Secondary | ICD-10-CM | POA: Diagnosis not present

## 2018-07-14 DIAGNOSIS — D631 Anemia in chronic kidney disease: Secondary | ICD-10-CM | POA: Diagnosis not present

## 2018-07-14 DIAGNOSIS — E8770 Fluid overload, unspecified: Secondary | ICD-10-CM | POA: Diagnosis not present

## 2018-07-15 DIAGNOSIS — D631 Anemia in chronic kidney disease: Secondary | ICD-10-CM | POA: Diagnosis not present

## 2018-07-15 DIAGNOSIS — E8770 Fluid overload, unspecified: Secondary | ICD-10-CM | POA: Diagnosis not present

## 2018-07-15 DIAGNOSIS — E1129 Type 2 diabetes mellitus with other diabetic kidney complication: Secondary | ICD-10-CM | POA: Diagnosis not present

## 2018-07-15 DIAGNOSIS — N2581 Secondary hyperparathyroidism of renal origin: Secondary | ICD-10-CM | POA: Diagnosis not present

## 2018-07-15 DIAGNOSIS — N186 End stage renal disease: Secondary | ICD-10-CM | POA: Diagnosis not present

## 2018-07-17 DIAGNOSIS — E8770 Fluid overload, unspecified: Secondary | ICD-10-CM | POA: Diagnosis not present

## 2018-07-17 DIAGNOSIS — N186 End stage renal disease: Secondary | ICD-10-CM | POA: Diagnosis not present

## 2018-07-17 DIAGNOSIS — D631 Anemia in chronic kidney disease: Secondary | ICD-10-CM | POA: Diagnosis not present

## 2018-07-17 DIAGNOSIS — N2581 Secondary hyperparathyroidism of renal origin: Secondary | ICD-10-CM | POA: Diagnosis not present

## 2018-07-17 DIAGNOSIS — E1129 Type 2 diabetes mellitus with other diabetic kidney complication: Secondary | ICD-10-CM | POA: Diagnosis not present

## 2018-07-18 ENCOUNTER — Ambulatory Visit (INDEPENDENT_AMBULATORY_CARE_PROVIDER_SITE_OTHER): Payer: Medicare Other | Admitting: Internal Medicine

## 2018-07-18 ENCOUNTER — Other Ambulatory Visit: Payer: Self-pay

## 2018-07-18 ENCOUNTER — Encounter: Payer: Self-pay | Admitting: Internal Medicine

## 2018-07-18 VITALS — BP 88/62 | HR 87 | Temp 98.3°F | Ht 65.8 in | Wt 236.0 lb

## 2018-07-18 DIAGNOSIS — Z9981 Dependence on supplemental oxygen: Secondary | ICD-10-CM

## 2018-07-18 DIAGNOSIS — Z1159 Encounter for screening for other viral diseases: Secondary | ICD-10-CM | POA: Diagnosis not present

## 2018-07-18 DIAGNOSIS — Z125 Encounter for screening for malignant neoplasm of prostate: Secondary | ICD-10-CM

## 2018-07-18 DIAGNOSIS — I5022 Chronic systolic (congestive) heart failure: Secondary | ICD-10-CM

## 2018-07-18 DIAGNOSIS — Z0001 Encounter for general adult medical examination with abnormal findings: Secondary | ICD-10-CM | POA: Diagnosis not present

## 2018-07-18 DIAGNOSIS — I951 Orthostatic hypotension: Secondary | ICD-10-CM

## 2018-07-18 MED ORDER — MIDODRINE HCL 10 MG PO TABS: ORAL_TABLET | ORAL | 0 refills | Status: DC

## 2018-07-18 NOTE — Progress Notes (Signed)
Subjective:     Patient ID: [name] , male    DOB: [birth year] , 58 y.o.   MRN: [id number]   Chief Complaint  Patient presents with  . Annual Exam    HPI  Pt is here for a physical. Rectal bleeding and pain is much  Better. Will see general surgeon next Tuesday. His BP has continued being low and his Nephrologist is aware of this. His Midodrine was increased but still gets light headed, but is not getting worse. Had his colonoscopy in the past year. Has not seen a general surgeon for anal fissure yet.   Past Medical History:  Diagnosis Date  . Atrial flutter ([place])   . 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 Cardiomyopathy   . CHF (congestive heart failure) ([place])   . Coronary artery disease    normal coronaries by 10/10/08 cath, Cardiac Cath 08-04-12 epic.Dr. Doylene Canard follows  . Diabetes mellitus    pt. states he's borderline diabetic., no longer taking med,- off med. since 2013  . Dialysis patient [facility])    Mon-Wed-Fri([place])- Left AV fistula  . Diverticulitis November 2016   reoccurred in December 2016  . ESRD (end stage renal disease) ([place])   . GERD (gastroesophageal reflux disease)   . Gout   . History of nephrectomy 07/04/2012   History of right nephrectomy in 2002 for renal cell carcinoma   . History of unilateral nephrectomy   . Hypertension   . Low iron   . Myocardial infarction [facility]) ?2006  . Renal cell carcinoma    dialysis- MWF- Dr. Mercy Moore follows.  . Renal insufficiency   . Shortness of breath 05/19/11   "at rest, lying down, w/exertion"  . Stroke [facility]) 02/2011   05/19/11 denies residual  . Umbilical hernia 78/24/23   unrepaired     Family History  Problem Relation Age of Onset  . Hypertension Mother   . Kidney disease Mother      Current Outpatient Medications:  .  allopurinol (ZYLOPRIM) 300 MG tablet, Take 150 mg by mouth every other day., Disp: , Rfl:  .  Amino Acids-Protein Hydrolys (FEEDING SUPPLEMENT,  PRO-STAT SUGAR FREE 64,) LIQD, Take 30 mLs by mouth 2 (two) times daily., Disp: 900 mL, Rfl: 0 .  amiodarone (PACERONE) 200 MG tablet, Take 0.5 tablets (100 mg total) by mouth daily., Disp: 30 tablet, Rfl: 1 .  calcium acetate (PHOSLO) 667 MG capsule, Take 667-2,001 mg by mouth See admin instructions. Take 3 capsules with meals and 1 capsule with snacks., Disp: , Rfl:  .  chlorhexidine (PERIDEX) 0.12 % solution, 15 mLs by Mouth Rinse route daily as needed. Mouth rinse, Disp: , Rfl:  .  doxercalciferol (HECTOROL) 4 MCG/2ML injection, Inject 1.5 mLs (3 mcg total) into the vein every Monday, Wednesday, and Friday with hemodialysis., Disp: , Rfl:  .  ferric citrate (AURYXIA) 1 GM 210 MG(Fe) tablet, Take 210 mg by mouth 3 (three) times daily with meals. , Disp: , Rfl:  .  hydrocortisone (ANUSOL-HC) 25 MG suppository, Place 1 suppository (25 mg total) rectally every 12 (twelve) hours., Disp: 12 suppository, Rfl: 1 .  isosorbide dinitrate (ISORDIL) 20 MG tablet, Take 20 mg by mouth 2 (two) times daily., Disp: , Rfl:  .  Lidocaine, Anorectal, (L-M-X 5) 5 % CREA, Apply 1 application topically 4 (four) times daily as needed (apply to area up to 6 times daily as needed)., Disp: 1 Tube, Rfl: 0 .  lidocaine-prilocaine (EMLA) cream, Apply 1 application topically See admin instructions. Apply topically to port access prior to dialysis - Monday, Wednesday, Friday, Disp: , Rfl: 12 .  midodrine (PROAMATINE) 10 MG tablet, Take 0.5-1 tablets (5-10 mg total) by mouth See admin instructions. Take one tablet (10 mg) by mouth before dialysis (Monday, Wednesday, Friday), take 1/2 tablet (5 mg) on non-dialysis days (Sunday, Tuesday, Thursday, Saturday) as needed for diastolic blood pressure <10. (Patient taking differently: Take 10 mg by mouth See admin instructions. Take one tablet (10 mg) by mouth at 5AM on Dialysis days (M,W,F), 10mg  at 5:30AM on Dialysis days (M,W,F), and 10mg  at 6AM on Dialysis days (M,W,F)), Disp: 120 tablet,  Rfl: 2 .  mirtazapine (REMERON) 7.5 MG tablet, Take 1 tablet (7.5 mg total) by mouth at bedtime., Disp: 30 tablet, Rfl: 2 .  multivitamin (RENA-VIT) TABS tablet, Take 1 tablet by mouth at bedtime., Disp: , Rfl:  .  nitroGLYCERIN (NITROSTAT) 0.4 MG SL tablet, Place 0.4 mg under the tongue every 5 (five) minutes as needed for chest pain. , Disp: , Rfl: 0 .  pantoprazole (PROTONIX) 40 MG tablet, Take 40 mg by mouth 2 (two) times daily., Disp: , Rfl:  .  warfarin (COUMADIN) 10 MG tablet, Take 1 tablet (10 mg total) by mouth daily at 6 PM., Disp: , Rfl:    Allergies  Allergen Reactions  . Ace Inhibitors Itching and Cough     Review of Systems  Constitutional: Negative.   HENT: Negative.   Eyes: Negative.   Respiratory: Positive for shortness of breath. Negative for cough.        SOB comes and goes and is related to low BP and if active  Cardiovascular: Negative for chest pain, palpitations and leg swelling.  Gastrointestinal: Positive for abdominal pain, anal bleeding, blood in stool and rectal pain. Negative for abdominal distention, constipation, diarrhea, nausea and vomiting.  Endocrine: Positive for polydipsia. Negative for cold intolerance, heat intolerance and polyphagia.  Genitourinary: Negative for penile pain, penile swelling, scrotal swelling and testicular pain.       He does not void  Musculoskeletal: Negative for arthralgias, back pain, joint swelling, myalgias, neck pain and neck stiffness.  Skin: Negative for rash and wound.  Allergic/Immunologic: Negative for environmental allergies and food allergies.  Neurological: Positive for dizziness, syncope, light-headedness and numbness. Negative for headaches.       Gets R foot numbness depending on sitting or laying position. Had syncope 4 weeks ago after dialysis, did not seek care. His friend found him sitting on a bench.   Hematological: Negative for adenopathy. Does not bruise/bleed easily.  Psychiatric/Behavioral: Negative for  sleep disturbance. The patient is not nervous/anxious.        No depression    EKG- sinus tach, abnormal( copy sent to his cardiologist) but when I compare it to his EKG from 01/26/18 there is minimal differences.   Today's Vitals   07/18/18 0853  BP: (!) 88/62  Pulse: 87  Temp: 98.3 F (36.8 C)  TempSrc: Oral  SpO2: 93%  Weight: 236 lb (107 kg)  Height: 5' 5.8" (1.671 m)   Body mass index is 38.32 kg/m.   Objective:  Physical Exam  BP (!) 88/62 (BP Location: Right Arm, Patient Position: Sitting, Cuff Size: Normal)   Pulse 87   Temp 98.3 F (36.8 C) (Oral)   Ht 5' 5.8" (1.671 m)   Wt 236 lb (107 kg)   SpO2 93%   BMI 38.32 kg/m  General Appearance:    Alert, cooperative, no distress, appears stated age. Gets light headed if he gets up from sitting position or bends over and gets back up, but takes his time to wait to pass.   Head:    Normocephalic, without obvious abnormality, atraumatic  Eyes:    PERRL, conjunctiva/corneas clear, EOM's intact, fundi    benign, both eyes       Ears:    Normal TM's and external ear canals, both ears  Nose:   Nares normal, septum midline, mucosa normal, no drainage   or sinus tenderness  Throat:   Lips, mucosa, and tongue normal; teeth and gums normal  Neck:   Supple, symmetrical, trachea midline, no adenopathy;       thyroid:  No enlargement/tenderness/nodules; no carotid   Bruit.  Back:     Symmetric, no curvature, ROM normal, no CVA tenderness  Lungs:     Clear to auscultation bilaterally, respirations unlabored  Chest wall:    No tenderness or deformity  Heart:    Regular rate and rhythm, S1 and S2 normal, no murmur, rub   or gallop  Abdomen:     Soft, non-tender, bowel sounds active all four quadrants,    no masses, no organomegaly. Has well healed scars.         Extremities:   Extremities normal, atraumatic, no cyanosis or edema  Pulses:   2+ and symmetric all extremities  Skin:   Skin color, texture, turgor normal, no rashes or  lesions  Lymph nodes:   Cervical, supraclavicular, and axillary nodes normal  Neurologic:   CNII-XII intact. Normal strength, sensation and reflexes      Throughout. Normal Rhomberg, finger to nose, tandem gait, heel and tip toe gait.    Assessment And Plan:  1. Screening for prostate cancer- screen.       PSA ordered and pt paid cash since his insurance will not cover this test.   2. Encounter for hepatitis C screening test for low risk patient-sreen. - Hepatitis C antibody 3- encounter for general exam- routine. FU 1 y.  4- Hypotension- chronic secondary to dialysis.   FU in 3 months  Kaegan Hettich RODRIGUEZ-SOUTHWORTH, PA-C

## 2018-07-18 NOTE — Patient Instructions (Signed)
Dr. Marylynn Pearson [facility] [address], [place], [place] [address] [phone]

## 2018-07-19 DIAGNOSIS — E8770 Fluid overload, unspecified: Secondary | ICD-10-CM | POA: Diagnosis not present

## 2018-07-19 DIAGNOSIS — N186 End stage renal disease: Secondary | ICD-10-CM | POA: Diagnosis not present

## 2018-07-19 DIAGNOSIS — E1129 Type 2 diabetes mellitus with other diabetic kidney complication: Secondary | ICD-10-CM | POA: Diagnosis not present

## 2018-07-19 DIAGNOSIS — N2581 Secondary hyperparathyroidism of renal origin: Secondary | ICD-10-CM | POA: Diagnosis not present

## 2018-07-19 DIAGNOSIS — D689 Coagulation defect, unspecified: Secondary | ICD-10-CM | POA: Diagnosis not present

## 2018-07-19 DIAGNOSIS — D631 Anemia in chronic kidney disease: Secondary | ICD-10-CM | POA: Diagnosis not present

## 2018-07-19 LAB — HEPATITIS C ANTIBODY: Hep C Virus Ab: <0.1 s/co ratio (ref 0.0–0.9)

## 2018-07-19 LAB — PSA: Prostate Specific Ag, Serum: 2.1 ng/mL (ref 0.0–4.0)

## 2018-07-20 LAB — PROTIME-INR: INR: 1.8 — AB (ref 0.9–1.1)

## 2018-07-21 DIAGNOSIS — N186 End stage renal disease: Secondary | ICD-10-CM | POA: Diagnosis not present

## 2018-07-21 DIAGNOSIS — N2581 Secondary hyperparathyroidism of renal origin: Secondary | ICD-10-CM | POA: Diagnosis not present

## 2018-07-21 DIAGNOSIS — D631 Anemia in chronic kidney disease: Secondary | ICD-10-CM | POA: Diagnosis not present

## 2018-07-21 DIAGNOSIS — E1129 Type 2 diabetes mellitus with other diabetic kidney complication: Secondary | ICD-10-CM | POA: Diagnosis not present

## 2018-07-21 DIAGNOSIS — E8770 Fluid overload, unspecified: Secondary | ICD-10-CM | POA: Diagnosis not present

## 2018-07-23 DIAGNOSIS — N186 End stage renal disease: Secondary | ICD-10-CM | POA: Diagnosis not present

## 2018-07-23 DIAGNOSIS — Z992 Dependence on renal dialysis: Secondary | ICD-10-CM | POA: Diagnosis not present

## 2018-07-23 DIAGNOSIS — I120 Hypertensive chronic kidney disease with stage 5 chronic kidney disease or end stage renal disease: Secondary | ICD-10-CM | POA: Diagnosis not present

## 2018-07-24 DIAGNOSIS — N2581 Secondary hyperparathyroidism of renal origin: Secondary | ICD-10-CM | POA: Diagnosis not present

## 2018-07-24 DIAGNOSIS — E1129 Type 2 diabetes mellitus with other diabetic kidney complication: Secondary | ICD-10-CM | POA: Diagnosis not present

## 2018-07-24 DIAGNOSIS — D631 Anemia in chronic kidney disease: Secondary | ICD-10-CM | POA: Diagnosis not present

## 2018-07-24 DIAGNOSIS — N186 End stage renal disease: Secondary | ICD-10-CM | POA: Diagnosis not present

## 2018-07-25 ENCOUNTER — Ambulatory Visit: Payer: Self-pay | Admitting: Surgery

## 2018-07-25 DIAGNOSIS — K580 Irritable bowel syndrome with diarrhea: Secondary | ICD-10-CM | POA: Diagnosis not present

## 2018-07-25 DIAGNOSIS — K603 Anal fistula: Secondary | ICD-10-CM | POA: Diagnosis not present

## 2018-07-25 NOTE — H&P (Signed)
[name] Documented: 07/25/2018 11:14 AM Location: [facility] Patient #: [id number] DOB: [birth year] Married / Language: English / Race: Black or African American Male  History of Present Illness Adin Hector MD; 07/25/2018 1:34 PM) The patient is a 58 year old male who presents with anal pain. Note for "Anal pain": ` ` ` Patient sent for surgical consultation at the request of Shelby Mattocks, PA  Chief Complaint: Anal pain and bleeding. Fissure versus hemorrhoids. ` `  Patient with many health issues as noted below. Has struggled with anal pain for quite some time. Patient has struggled with loose bowel movements since he's got on hemodialysis. Usually 1 or 2 bouts of every meal. Several times a day. Not a fiber supplement. Has not try to slow down. The pain was severe enough he went to the emergency room. Examination not classic for hemorrhoid or abscess. CT scan did not show proctitis no obvious abscess. Followed up with primary care office. There was concerned that perhaps he had a fissure. He's been trying suppositories and other things but still struggles with pain. Surgical consultation offered. We had him in the next week. He is rather frustrating uncomfortable. He comes today with his wife. Denies any mass falling out. Over constant discomfort. Usually worse around the time of bowel movements. He does feel some leaking or almost like some gas comes out or bubbling with bowel movements. No major leakage requiring a pad. He does not recall ever having an abscess down there. No prior hemorrhoidal banding's. He had a screening colonoscopy done in 2018 by Dr. Collene Mares. No proctitis. Biopsies underwhelming. One hyperplastic polyp removed. No major anorectal issues except for some mildly enlarged hemorrhoids to my view but not very concerning to the gastroenterologist. 10 year follow-up recommended.  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 Followed by [facility] - Dr. Marval Regal. Hemodialysis Monday Wednesday Friday through a left forearm AV fistula. History of acute on chronic heart failure. Ejection fraction in the 20s. Latest echo with ejection fraction 30-35 percent. Can walk half hour without difficulty. Followed by Dr. Doylene Canard. 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  No personal nor family history of GI/colon cancer, inflammatory bowel disease, irritable bowel syndrome, allergy such as Celiac Sprue, dietary/dairy problems, colitis, ulcers nor gastritis. No recent sick contacts/gastroenteritis. No travel outside the country. No changes in diet. No dysphagia to solids or liquids. No significant heartburn or reflux. No hematochezia, hematemesis, coffee ground emesis. No evidence of prior gastric/peptic ulceration.  (Review of systems as stated in this history (HPI) or in the review of systems. Otherwise all other 12 point ROS are negative) ` ` `   Allergies (Tanisha A. Owens Shark, Babb; 07/25/2018 11:15 AM) ACE Inhibitors Allergies Reconciled  Medication History (Tanisha A. Owens Shark, RMA; 07/25/2018 11:15 AM) TraMADol HCl (50MG  Tablet, Oral) Active. Allopurinol (300MG  Tablet, Oral) Active. Amiodarone HCl (200MG  Tablet, Oral) Active. AmLODIPine Besylate (5MG  Tablet, Oral) Active. Carvedilol (25MG  Tablet, Oral) Active. Clindamycin HCl (150MG  Capsule, Oral) Active. Isosorbide Dinitrate (20MG  Tablet, Oral) Active. Sensipar (90MG  Tablet, Oral) Active. Warfarin Sodium (6MG  Tablet, Oral) Active. Promethazine HCl (25MG  Tablet, Oral) Active. Pantoprazole Sodium (40MG  Tablet DR, Oral) Active. Ondansetron HCl (8MG  Tablet, Oral) Active. Ciprofloxacin HCl (500MG  Tablet, Oral) Active. Guaifenesin-Codeine (100-10MG /5ML Solution, Oral) Active. Azithromycin (250MG  Tablet, Oral)  Active. MetroNIDAZOLE (500MG  Tablet, Oral) Active. Medications Reconciled    Vitals (Tanisha A. Owens Shark  RMA; 07/25/2018 11:15 AM) 07/25/2018 11:15 AM Weight: 235.4 lb Height: 73in Body Surface Area: 2.31 m Body Mass Index: 31.06 kg/m  Temp.: 98.54F  BP: 112/74 (Sitting, Left Arm, Standard)      Physical Exam Adin Hector MD; 07/25/2018 1:37 PM)  General Mental Status-Alert. General Appearance-Not in acute distress, Not Sickly. Orientation-Oriented X3. Hydration-Well hydrated. Voice-Normal.  Integumentary Global Assessment Upon inspection and palpation of skin surfaces of the - Axillae: non-tender, no inflammation or ulceration, no drainage. and Distribution of scalp and body hair is normal. General Characteristics Temperature - normal warmth is noted.  Head and Neck Head-normocephalic, atraumatic with no lesions or palpable masses. Face Global Assessment - atraumatic, no absence of expression. Neck Global Assessment - no abnormal movements, no bruit auscultated on the right, no bruit auscultated on the left, no decreased range of motion, non-tender. Trachea-midline. Thyroid Gland Characteristics - non-tender.  Eye Eyeball - Left-Extraocular movements intact, No Nystagmus. Eyeball - Right-Extraocular movements intact, No Nystagmus. Cornea - Left-No Hazy. Cornea - Right-No Hazy. Sclera/Conjunctiva - Left-No scleral icterus, No Discharge. Sclera/Conjunctiva - Right-No scleral icterus, No Discharge. Pupil - Left-Direct reaction to light normal. Pupil - Right-Direct reaction to light normal.  ENMT Ears Pinna - Left - no drainage observed, no generalized tenderness observed. Right - no drainage observed, no generalized tenderness observed. Nose and Sinuses External Inspection of the Nose - no destructive lesion observed. Inspection of the nares - Left - quiet respiration. Right - quiet respiration. Mouth and Throat Lips -  Upper Lip - no fissures observed, no pallor noted. Lower Lip - no fissures observed, no pallor noted. Nasopharynx - no discharge present. Oral Cavity/Oropharynx - Tongue - no dryness observed. Oral Mucosa - no cyanosis observed. Hypopharynx - no evidence of airway distress observed.  Chest and Lung Exam Inspection Movements - Normal and Symmetrical. Accessory muscles - No use of accessory muscles in breathing. Palpation Palpation of the chest reveals - Non-tender. Auscultation Breath sounds - Normal and Clear.  Cardiovascular Auscultation Rhythm - Regular. Murmurs & Other Heart Sounds - Auscultation of the heart reveals - No Murmurs and No Systolic Clicks.  Abdomen Inspection Inspection of the abdomen reveals - No Visible peristalsis and No Abnormal pulsations. Umbilicus - No Bleeding, No Urine drainage. Palpation/Percussion Palpation and Percussion of the abdomen reveal - Soft, Non Tender, No Rebound tenderness, No Rigidity (guarding) and No Cutaneous hyperesthesia. Note: Abdomen obese but soft. Right subcostal incision consistent with prior nephrectomy 2002. Laparoscopic incisions. Soft. Nontender. Not distended. Small umbilical hernia at base of the umbilical stalk. CT scan notes a contains fat only in small as well. No incisional hernias. No guarding.  Male Genitourinary Sexual Maturity Tanner 5 - Adult hair pattern and Adult penile size and shape. Note: No inguinal hernias. Normal external genitalia. Epididymi, testes, and spermatic cords normal without any masses.  Rectal Note: Perianal moisture. Right posterior midline small opening 2 cm from the anal verge with some chronic yellow drainage. I can feel a subcutaneous cord heading towards the sphincters. Very suspicious for anal fistula.   Mildly increased sphincter tone. No major external hemorrhoids. No fissure. No external warts or external hemorrhoids. I held off on any additional rectal exam.  Peripheral  Vascular Upper Extremity Inspection - Left - No Cyanotic nailbeds, Not Ischemic. Right - No Cyanotic nailbeds, Not Ischemic.  Neurologic Neurologic evaluation reveals -normal attention span and ability to concentrate, able to name objects and repeat phrases. Appropriate fund of knowledge , normal sensation and normal coordination.  Mental Status Affect - not angry, not paranoid. Cranial Nerves-Normal Bilaterally. Gait-Normal.  Neuropsychiatric Mental status exam performed with findings of-able to articulate well with normal speech/language, rate, volume and coherence, thought content normal with ability to perform basic computations and apply abstract reasoning and no evidence of hallucinations, delusions, obsessions or homicidal/suicidal ideation.  Musculoskeletal Global Assessment Spine, Ribs and Pelvis - no instability, subluxation or laxity. Right Upper Extremity - no instability, subluxation or laxity.  Lymphatic Head & Neck  General Head & Neck Lymphatics: Bilateral - Description - No Localized lymphadenopathy. Axillary  General Axillary Region: Bilateral - Description - No Localized lymphadenopathy. Femoral & Inguinal  Generalized Femoral & Inguinal Lymphatics: Left - Description - No Localized lymphadenopathy. Right - Description - No Localized lymphadenopathy.    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 No strong evidence of anal fissure. No thrombosed hemorrhoid. I think he would benefit from examination under anesthesia. If superficial, plan fistulotomy. If intersphincteric, planned LIF T repair versus seton. Try to avoid multiple operations in this patient. Try and do this sooner.  Placed on oral Augmentin for 5 days to help clear up any possible irritation.  Warm soaks. Avoid suppositories.  Cardiac clearance. Make sure it safe to come off his warfarin  anticoagulation.  Most likely to do this at [facility] given his cardiac & ESRD issues. Do an off dialysis day. Therefore Tuesday/Thursday. Keep overnight given his other medical health issues and make sure that he does not have any perioperative concerns.  Current Plans Pt Education - [facility] Abscess/Fistula (AT): discussed with patient and provided information. The anatomy & physiology of the anorectal region was discussed. We discussed the pathophysiology of anorectal abscess and fistula. Differential diagnosis was discussed. Natural history progression was discussed. I stressed the importance of a bowel regimen to have daily soft bowel movements to minimize progression of disease.  The patient's condition is not adequately controlled. Non-operative treatment has not healed the fistula. Therefore, I recommended examination under anaesthesia to confirm the diagnosis and treat the fistula. I discussed techniques that may be required such as fistulotomy, ligation by LIFT technique, and/or seton placement. Benefits & alternatives discussed. I noted a good likelihood this will help address the problem, but sometimes repeat operations and prolonged healing times may occur. Risks such as bleeding, pain, recurrence, reoperation, incontinence, heart attack, death, and other risks were discussed.  Educational handouts further explaining the pathology, treatment options, and bowel regimen were given. The patient expressed understanding & wishes to proceed. We will work to coordinate surgery for a mutually convenient time.  I recommended obtaining preoperative cardiac clearance for recommendations on management of anticoagualtion perioperatively:  1. Timing of holding anticoagulation 2. Need for any bridge therapy (SQ enoxaparin, IV heparin, IV Aggrastat, etc) preop/postop. 3. Desired timing of resumption of anticoagulation.  In general from Dr. Johney Maine'  standpoint:  Aspirin is okay to continue perioperatively (81mg  or 325mg ) & does not need to be held  Hold warfarin 5 dayspreoperatively. Consider PT/INR level on arrival to short stay the day of surgery. No need to check PT/INR level on preop visit  Hold P2Y12 inibitors such as clopidrogel (Plavix) 4 dayspreoperatively  Hold direct thrombin / factor Xa inhibitors (Xerelto, Pradaxa, Eliquis, etc) 2 dayspreoperatively   Request clearance by cardiology to better assess operative risk & see if a reevaluation, further workup, etc is needed. Also recommendations on how medications such as for anticoagulation  and blood pressure should be managed/held/restarted after surgery.  Started Amoxicillin 875 MG Oral Tablet, 1 (one) Tablet two times daily, 5 days starting 07/25/2018, Ref. x1.  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 I strongly recommend he try and slow his bowel movements down. Get on a fiber supplement to thicken up his stools. Take antidiarrheal such as Pepto/Imodium. Get to less than 3 bowel movements a day. That will decrease stress around the perianal region and hopefully prevent any new or recurring problems in the future. No evidence of any coloproctitis or inflammatory bowel disease on endoscopy almost 2 years ago. Consider reevaluating gastroenterology if antidiarrheals and other issues do not improve, but doubtful that he will need anything more aggressive than that for now.  Current Plans Pt Education - [facility] IBS patient info: discussed with patient and provided information. Pt Education - [place] ([place])  ENCOUNTER FOR PREOPERATIVE EXAMINATION FOR GENERAL SURGICAL PROCEDURE (Z01.818)  Current Plans You are being scheduled for surgery- Our schedulers will call you.  You should hear from our office's scheduling department within 5 working days about  the location, date, and time of surgery. We try to make accommodations for patient's preferences in scheduling surgery, but sometimes the OR schedule or the surgeon's schedule prevents [place] from making those accommodations.  If you have not heard from our office [phone]) in 5 working days, call the office and ask for your surgeon's nurse.  If you have other questions about your diagnosis, plan, or surgery, call the office and ask for your surgeon's nurse.  Pt Education - [facility] Rectal Prep for Anorectal outpatient/office surgery: discussed with patient and provided information. Pt Education - [facility] Rectal Surgery HCI (Gaylon Melchor): discussed with patient and provided information. Pt Education - [place] (Quency Tober)

## 2018-07-25 NOTE — H&P (View-Only) (Signed)
[name] Documented: 07/25/2018 11:14 AM Location: [facility] Patient #: [id number] DOB: April 03, 1961 Married / Language: English / Race: Black or African American Male  History of Present Illness [name]; 07/25/2018 1:34 PM) The patient is a 58 year old male who presents with anal pain. Note for "Anal pain": ` ` ` Patient sent for surgical consultation at the request of Matthew Mattocks, PA  Chief Complaint: Anal pain and bleeding. Fissure versus hemorrhoids. ` `  Patient with many health issues as noted below. Has struggled with anal pain for quite some time. Patient has struggled with loose bowel movements since he's got on hemodialysis. Usually 1 or 2 bouts of every meal. Several times a day. Not a fiber supplement. Has not try to slow down. The pain was severe enough he went to the emergency room. Examination not classic for hemorrhoid or abscess. CT scan did not show proctitis no obvious abscess. Followed up with primary care office. There was concerned that perhaps he had a fissure. He's been trying suppositories and other things but still struggles with pain. Surgical consultation offered. We had him in the next week. He is rather frustrating uncomfortable. He comes today with his wife. Denies any mass falling out. Over constant discomfort. Usually worse around the time of bowel movements. He does feel some leaking or almost like some gas comes out or bubbling with bowel movements. No major leakage requiring a pad. He does not recall ever having an abscess down there. No prior hemorrhoidal banding's. He had a screening colonoscopy done in 2018 by Dr. Collene Stephenson. No proctitis. Biopsies underwhelming. One hyperplastic polyp removed. No major anorectal issues except for some mildly enlarged hemorrhoids to my view but not very concerning to the gastroenterologist. 10 year follow-up recommended.  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 Followed by [facility] - Dr. Marval Stephenson. Hemodialysis Monday Wednesday Friday through a left forearm AV fistula. History of acute on chronic heart failure. Ejection fraction in the 20s. Latest echo with ejection fraction 30-35 percent. Can walk half hour without difficulty. Followed by Dr. Doylene Stephenson. 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  No personal nor family history of GI/colon cancer, inflammatory bowel disease, irritable bowel syndrome, allergy such as Celiac Sprue, dietary/dairy problems, colitis, ulcers nor gastritis. No recent sick contacts/gastroenteritis. No travel outside the country. No changes in diet. No dysphagia to solids or liquids. No significant heartburn or reflux. No hematochezia, hematemesis, coffee ground emesis. No evidence of prior gastric/peptic ulceration.  (Review of systems as stated in this history (HPI) or in the review of systems. Otherwise all other 12 point ROS are negative) ` ` `   Allergies ([name], [place]; 07/25/2018 11:15 AM) ACE Inhibitors Allergies Reconciled  Medication History ([name], RMA; 07/25/2018 11:15 AM) TraMADol HCl (50MG  Tablet, Oral) Active. Allopurinol (300MG  Tablet, Oral) Active. Amiodarone HCl (200MG  Tablet, Oral) Active. AmLODIPine Besylate (5MG  Tablet, Oral) Active. Carvedilol (25MG  Tablet, Oral) Active. Clindamycin HCl (150MG  Capsule, Oral) Active. Isosorbide Dinitrate (20MG  Tablet, Oral) Active. Sensipar (90MG  Tablet, Oral) Active. Warfarin Sodium (6MG  Tablet, Oral) Active. Promethazine HCl (25MG  Tablet, Oral) Active. Pantoprazole Sodium (40MG  Tablet DR, Oral) Active. Ondansetron HCl (8MG  Tablet, Oral) Active. Ciprofloxacin HCl (500MG  Tablet, Oral) Active. Guaifenesin-Codeine (100-10MG /5ML Solution, Oral) Active. Azithromycin (250MG  Tablet, Oral)  Active. MetroNIDAZOLE (500MG  Tablet, Oral) Active. Medications Reconciled    Vitals ([name]  RMA; 07/25/2018 11:15 AM) 07/25/2018 11:15 AM Weight: 235.4 lb Height: 73in Body Surface Area: 2.31 m Body Mass Index: 31.06 kg/m  Temp.: 98.88F  BP: 112/74 (Sitting, Left Arm, Standard)      Physical Exam [name]; 07/25/2018 1:37 PM)  General Mental Status-Alert. General Appearance-Not in acute distress, Not Sickly. Orientation-Oriented X3. Hydration-Well hydrated. Voice-Normal.  Integumentary Global Assessment Upon inspection and palpation of skin surfaces of the - Axillae: non-tender, no inflammation or ulceration, no drainage. and Distribution of scalp and body hair is normal. General Characteristics Temperature - normal warmth is noted.  Head and Neck Head-normocephalic, atraumatic with no lesions or palpable masses. Face Global Assessment - atraumatic, no absence of expression. Neck Global Assessment - no abnormal movements, no bruit auscultated on the right, no bruit auscultated on the left, no decreased range of motion, non-tender. Trachea-midline. Thyroid Gland Characteristics - non-tender.  Eye Eyeball - Left-Extraocular movements intact, No Nystagmus. Eyeball - Right-Extraocular movements intact, No Nystagmus. Cornea - Left-No Hazy. Cornea - Right-No Hazy. Sclera/Conjunctiva - Left-No scleral icterus, No Discharge. Sclera/Conjunctiva - Right-No scleral icterus, No Discharge. Pupil - Left-Direct reaction to light normal. Pupil - Right-Direct reaction to light normal.  ENMT Ears Pinna - Left - no drainage observed, no generalized tenderness observed. Right - no drainage observed, no generalized tenderness observed. Nose and Sinuses External Inspection of the Nose - no destructive lesion observed. Inspection of the nares - Left - quiet respiration. Right - quiet respiration. Mouth and Throat Lips -  Upper Lip - no fissures observed, no pallor noted. Lower Lip - no fissures observed, no pallor noted. Nasopharynx - no discharge present. Oral Cavity/Oropharynx - Tongue - no dryness observed. Oral Mucosa - no cyanosis observed. Hypopharynx - no evidence of airway distress observed.  Chest and Lung Exam Inspection Movements - Normal and Symmetrical. Accessory muscles - No use of accessory muscles in breathing. Palpation Palpation of the chest reveals - Non-tender. Auscultation Breath sounds - Normal and Clear.  Cardiovascular Auscultation Rhythm - Regular. Murmurs & Other Heart Sounds - Auscultation of the heart reveals - No Murmurs and No Systolic Clicks.  Abdomen Inspection Inspection of the abdomen reveals - No Visible peristalsis and No Abnormal pulsations. Umbilicus - No Bleeding, No Urine drainage. Palpation/Percussion Palpation and Percussion of the abdomen reveal - Soft, Non Tender, No Rebound tenderness, No Rigidity (guarding) and No Cutaneous hyperesthesia. Note: Abdomen obese but soft. Right subcostal incision consistent with prior nephrectomy 2002. Laparoscopic incisions. Soft. Nontender. Not distended. Small umbilical hernia at base of the umbilical stalk. CT scan notes a contains fat only in small as well. No incisional hernias. No guarding.  Male Genitourinary Sexual Maturity Tanner 5 - Adult hair pattern and Adult penile size and shape. Note: No inguinal hernias. Normal external genitalia. Epididymi, testes, and spermatic cords normal without any masses.  Rectal Note: Perianal moisture. Right posterior midline small opening 2 cm from the anal verge with some chronic yellow drainage. I can feel a subcutaneous cord heading towards the sphincters. Very suspicious for anal fistula.   Mildly increased sphincter tone. No major external hemorrhoids. No fissure. No external warts or external hemorrhoids. I held off on any additional rectal exam.  Peripheral  Vascular Upper Extremity Inspection - Left - No Cyanotic nailbeds, Not Ischemic. Right - No Cyanotic nailbeds, Not Ischemic.  Neurologic Neurologic evaluation reveals -normal attention span and ability to concentrate, able to name objects and repeat phrases. Appropriate fund of knowledge , normal sensation and normal coordination.  Mental Status Affect - not angry, not paranoid. Cranial Nerves-Normal Bilaterally. Gait-Normal.  Neuropsychiatric Mental status exam performed with findings of-able to articulate well with normal speech/language, rate, volume and coherence, thought content normal with ability to perform basic computations and apply abstract reasoning and no evidence of hallucinations, delusions, obsessions or homicidal/suicidal ideation.  Musculoskeletal Global Assessment Spine, Ribs and Pelvis - no instability, subluxation or laxity. Right Upper Extremity - no instability, subluxation or laxity.  Lymphatic Head & Neck  General Head & Neck Lymphatics: Bilateral - Description - No Localized lymphadenopathy. Axillary  General Axillary Region: Bilateral - Description - No Localized lymphadenopathy. Femoral & Inguinal  Generalized Femoral & Inguinal Lymphatics: Left - Description - No Localized lymphadenopathy. Right - Description - No Localized lymphadenopathy.    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 No strong evidence of anal fissure. No thrombosed hemorrhoid. I think he would benefit from examination under anesthesia. If superficial, plan fistulotomy. If intersphincteric, planned LIF T repair versus seton. Try to avoid multiple operations in this patient. Try and do this sooner.  Placed on oral Augmentin for 5 days to help clear up any possible irritation.  Warm soaks. Avoid suppositories.  Cardiac clearance. Make sure it safe to come off his warfarin  anticoagulation.  Most likely to do this at [facility] given his cardiac & ESRD issues. Do an off dialysis day. Therefore Tuesday/Thursday. Keep overnight given his other medical health issues and make sure that he does not have any perioperative concerns.  Current Plans Pt Education - [facility] Abscess/Fistula (AT): discussed with patient and provided information. The anatomy & physiology of the anorectal region was discussed. We discussed the pathophysiology of anorectal abscess and fistula. Differential diagnosis was discussed. Natural history progression was discussed. I stressed the importance of a bowel regimen to have daily soft bowel movements to minimize progression of disease.  The patient's condition is not adequately controlled. Non-operative treatment has not healed the fistula. Therefore, I recommended examination under anaesthesia to confirm the diagnosis and treat the fistula. I discussed techniques that may be required such as fistulotomy, ligation by LIFT technique, and/or seton placement. Benefits & alternatives discussed. I noted a good likelihood this will help address the problem, but sometimes repeat operations and prolonged healing times may occur. Risks such as bleeding, pain, recurrence, reoperation, incontinence, heart attack, death, and other risks were discussed.  Educational handouts further explaining the pathology, treatment options, and bowel regimen were given. The patient expressed understanding & wishes to proceed. We will work to coordinate surgery for a mutually convenient time.  I recommended obtaining preoperative cardiac clearance for recommendations on management of anticoagualtion perioperatively:  1. Timing of holding anticoagulation 2. Need for any bridge therapy (SQ enoxaparin, IV heparin, IV Aggrastat, etc) preop/postop. 3. Desired timing of resumption of anticoagulation.  In general from Dr. Johney Maine'  standpoint:  Aspirin is okay to continue perioperatively (81mg  or 325mg ) & does not need to be held  Hold warfarin 5 dayspreoperatively. Consider PT/INR level on arrival to short stay the day of surgery. No need to check PT/INR level on preop visit  Hold P2Y12 inibitors such as clopidrogel (Plavix) 4 dayspreoperatively  Hold direct thrombin / factor Xa inhibitors (Xerelto, Pradaxa, Eliquis, etc) 2 dayspreoperatively   Request clearance by cardiology to better assess operative risk & see if a reevaluation, further workup, etc is needed. Also recommendations on how medications such as for anticoagulation  and blood pressure should be managed/held/restarted after surgery.  Started Amoxicillin 875 MG Oral Tablet, 1 (one) Tablet two times daily, 5 days starting 07/25/2018, Ref. x1.  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 I strongly recommend he try and slow his bowel movements down. Get on a fiber supplement to thicken up his stools. Take antidiarrheal such as Pepto/Imodium. Get to less than 3 bowel movements a day. That will decrease stress around the perianal region and hopefully prevent any new or recurring problems in the future. No evidence of any coloproctitis or inflammatory bowel disease on endoscopy almost 2 years ago. Consider reevaluating gastroenterology if antidiarrheals and other issues do not improve, but doubtful that he will need anything more aggressive than that for now.  Current Plans Pt Education - [facility] IBS patient info: discussed with patient and provided information. Pt Education - [place] (Noralee Dutko)  ENCOUNTER FOR PREOPERATIVE EXAMINATION FOR GENERAL SURGICAL PROCEDURE (Z01.818)  Current Plans You are being scheduled for surgery- Our schedulers will call you.  You should hear from our office's scheduling department within 5 working days about  the location, date, and time of surgery. We try to make accommodations for patient's preferences in scheduling surgery, but sometimes the OR schedule or the surgeon's schedule prevents [place] from making those accommodations.  If you have not heard from our office [phone]) in 5 working days, call the office and ask for your surgeon's nurse.  If you have other questions about your diagnosis, plan, or surgery, call the office and ask for your surgeon's nurse.  Pt Education - [facility] Rectal Prep for Anorectal outpatient/office surgery: discussed with patient and provided information. Pt Education - [facility] Rectal Surgery [facility] (Magdalynn Davilla): discussed with patient and provided information. Pt Education - [place] (Olney Monier)

## 2018-07-26 DIAGNOSIS — N2581 Secondary hyperparathyroidism of renal origin: Secondary | ICD-10-CM | POA: Diagnosis not present

## 2018-07-26 DIAGNOSIS — E1129 Type 2 diabetes mellitus with other diabetic kidney complication: Secondary | ICD-10-CM | POA: Diagnosis not present

## 2018-07-26 DIAGNOSIS — D631 Anemia in chronic kidney disease: Secondary | ICD-10-CM | POA: Diagnosis not present

## 2018-07-26 DIAGNOSIS — N186 End stage renal disease: Secondary | ICD-10-CM | POA: Diagnosis not present

## 2018-07-26 DIAGNOSIS — D689 Coagulation defect, unspecified: Secondary | ICD-10-CM | POA: Diagnosis not present

## 2018-07-26 NOTE — Addendum Note (Signed)
Addended by: Nicki Guadalajara on: 07/26/2018 08:40 AM   Modules accepted: Orders

## 2018-07-27 LAB — PROTIME-INR: INR: 1.7 — AB (ref 0.9–1.1)

## 2018-07-28 DIAGNOSIS — N186 End stage renal disease: Secondary | ICD-10-CM | POA: Diagnosis not present

## 2018-07-28 DIAGNOSIS — E1129 Type 2 diabetes mellitus with other diabetic kidney complication: Secondary | ICD-10-CM | POA: Diagnosis not present

## 2018-07-28 DIAGNOSIS — D631 Anemia in chronic kidney disease: Secondary | ICD-10-CM | POA: Diagnosis not present

## 2018-07-28 DIAGNOSIS — N2581 Secondary hyperparathyroidism of renal origin: Secondary | ICD-10-CM | POA: Diagnosis not present

## 2018-07-31 ENCOUNTER — Other Ambulatory Visit: Payer: Self-pay

## 2018-07-31 ENCOUNTER — Emergency Department (HOSPITAL_COMMUNITY): Payer: Medicare Other

## 2018-07-31 ENCOUNTER — Observation Stay (HOSPITAL_COMMUNITY)
Admission: EM | Admit: 2018-07-31 | Discharge: 2018-08-01 | Disposition: A | Discharge status: Home / Self Care | Payer: Medicare Other | Attending: Internal Medicine | Admitting: Internal Medicine

## 2018-07-31 ENCOUNTER — Encounter (HOSPITAL_COMMUNITY): Payer: Self-pay

## 2018-07-31 DIAGNOSIS — I5022 Chronic systolic (congestive) heart failure: Secondary | ICD-10-CM | POA: Diagnosis not present

## 2018-07-31 DIAGNOSIS — K604 Rectal fistula: Secondary | ICD-10-CM | POA: Diagnosis not present

## 2018-07-31 DIAGNOSIS — I5042 Chronic combined systolic (congestive) and diastolic (congestive) heart failure: Secondary | ICD-10-CM | POA: Insufficient documentation

## 2018-07-31 DIAGNOSIS — N186 End stage renal disease: Secondary | ICD-10-CM

## 2018-07-31 DIAGNOSIS — K439 Ventral hernia without obstruction or gangrene: Secondary | ICD-10-CM | POA: Diagnosis not present

## 2018-07-31 DIAGNOSIS — I2510 Atherosclerotic heart disease of native coronary artery without angina pectoris: Secondary | ICD-10-CM | POA: Diagnosis not present

## 2018-07-31 DIAGNOSIS — Z79899 Other long term (current) drug therapy: Secondary | ICD-10-CM | POA: Insufficient documentation

## 2018-07-31 DIAGNOSIS — I252 Old myocardial infarction: Secondary | ICD-10-CM | POA: Insufficient documentation

## 2018-07-31 DIAGNOSIS — N62 Hypertrophy of breast: Secondary | ICD-10-CM | POA: Insufficient documentation

## 2018-07-31 DIAGNOSIS — Z905 Acquired absence of kidney: Secondary | ICD-10-CM | POA: Diagnosis not present

## 2018-07-31 DIAGNOSIS — E1122 Type 2 diabetes mellitus with diabetic chronic kidney disease: Secondary | ICD-10-CM | POA: Insufficient documentation

## 2018-07-31 DIAGNOSIS — I10 Essential (primary) hypertension: Secondary | ICD-10-CM | POA: Diagnosis present

## 2018-07-31 DIAGNOSIS — I132 Hypertensive heart and chronic kidney disease with heart failure and with stage 5 chronic kidney disease, or end stage renal disease: Secondary | ICD-10-CM | POA: Diagnosis not present

## 2018-07-31 DIAGNOSIS — Z7901 Long term (current) use of anticoagulants: Secondary | ICD-10-CM | POA: Diagnosis not present

## 2018-07-31 DIAGNOSIS — K612 Anorectal abscess: Secondary | ICD-10-CM | POA: Insufficient documentation

## 2018-07-31 DIAGNOSIS — R0602 Shortness of breath: Secondary | ICD-10-CM | POA: Diagnosis not present

## 2018-07-31 DIAGNOSIS — R2681 Unsteadiness on feet: Secondary | ICD-10-CM | POA: Diagnosis not present

## 2018-07-31 DIAGNOSIS — Z86711 Personal history of pulmonary embolism: Secondary | ICD-10-CM | POA: Diagnosis not present

## 2018-07-31 DIAGNOSIS — I959 Hypotension, unspecified: Secondary | ICD-10-CM | POA: Diagnosis not present

## 2018-07-31 DIAGNOSIS — I4892 Unspecified atrial flutter: Secondary | ICD-10-CM | POA: Diagnosis not present

## 2018-07-31 DIAGNOSIS — Z8673 Personal history of transient ischemic attack (TIA), and cerebral infarction without residual deficits: Secondary | ICD-10-CM | POA: Insufficient documentation

## 2018-07-31 DIAGNOSIS — I6381 Other cerebral infarction due to occlusion or stenosis of small artery: Secondary | ICD-10-CM | POA: Diagnosis not present

## 2018-07-31 DIAGNOSIS — M1A9XX0 Chronic gout, unspecified, without tophus (tophi): Secondary | ICD-10-CM | POA: Diagnosis present

## 2018-07-31 DIAGNOSIS — I708 Atherosclerosis of other arteries: Secondary | ICD-10-CM | POA: Insufficient documentation

## 2018-07-31 DIAGNOSIS — R42 Dizziness and giddiness: Secondary | ICD-10-CM | POA: Diagnosis not present

## 2018-07-31 DIAGNOSIS — Z8679 Personal history of other diseases of the circulatory system: Secondary | ICD-10-CM

## 2018-07-31 DIAGNOSIS — Z992 Dependence on renal dialysis: Secondary | ICD-10-CM | POA: Diagnosis not present

## 2018-07-31 DIAGNOSIS — E119 Type 2 diabetes mellitus without complications: Secondary | ICD-10-CM

## 2018-07-31 LAB — CBC
HCT: 33.8 % — ABNORMAL LOW (ref 39.0–52.0)
Hemoglobin: 10.6 g/dL — ABNORMAL LOW (ref 13.0–17.0)
MCH: 31.4 pg (ref 26.0–34.0)
MCHC: 31.4 g/dL (ref 30.0–36.0)
MCV: 100 fL (ref 80.0–100.0)
Platelets: 180 10*3/uL (ref 150–400)
RBC: 3.38 MIL/uL — ABNORMAL LOW (ref 4.22–5.81)
RDW: 17.1 % — ABNORMAL HIGH (ref 11.5–15.5)
WBC: 5.7 10*3/uL (ref 4.0–10.5)
nRBC: 0 % (ref 0.0–0.2)

## 2018-07-31 LAB — BASIC METABOLIC PANEL
Anion gap: 16 — ABNORMAL HIGH (ref 5–15)
BUN: 54 mg/dL — ABNORMAL HIGH (ref 6–20)
CO2: 22 mmol/L (ref 22–32)
Calcium: 9 mg/dL (ref 8.9–10.3)
Chloride: 95 mmol/L — ABNORMAL LOW (ref 98–111)
Creatinine, Ser: 12.77 mg/dL — ABNORMAL HIGH (ref 0.61–1.24)
GFR calc Af Amer: 4 mL/min — ABNORMAL LOW (ref >60)
GFR calc non Af Amer: 4 mL/min — ABNORMAL LOW (ref >60)
Glucose, Bld: 86 mg/dL (ref 70–99)
Potassium: 5.7 mmol/L — ABNORMAL HIGH (ref 3.5–5.1)
Sodium: 133 mmol/L — ABNORMAL LOW (ref 135–145)

## 2018-07-31 LAB — GLUCOSE, CAPILLARY
Glucose-Capillary: 74 mg/dL (ref 70–99)
Glucose-Capillary: 89 mg/dL (ref 70–99)

## 2018-07-31 LAB — CBG MONITORING, ED: Glucose-Capillary: 86 mg/dL (ref 70–99)

## 2018-07-31 LAB — HEMOGLOBIN A1C
Hgb A1c MFr Bld: 5.1 % (ref 4.8–5.6)
Mean Plasma Glucose: 99.67 mg/dL

## 2018-07-31 LAB — POTASSIUM: Potassium: 5.1 mmol/L (ref 3.5–5.1)

## 2018-07-31 LAB — PROTIME-INR
INR: 1.9 — ABNORMAL HIGH (ref 0.8–1.2)
Prothrombin Time: 21.7 seconds — ABNORMAL HIGH (ref 11.4–15.2)

## 2018-07-31 MED ORDER — MIRTAZAPINE 15 MG PO TABS
7.5000 mg | ORAL_TABLET | Freq: Every day | ORAL | Status: DC
  Administered 2018-07-31: 7.5 mg via ORAL
  Filled 2018-07-31: qty 1

## 2018-07-31 MED ORDER — SODIUM CHLORIDE 0.9% FLUSH: 3.0000 mL | Freq: Once | INTRAVENOUS | Status: DC

## 2018-07-31 MED ORDER — LIDOCAINE HCL (PF) 1 % IJ SOLN: 5.0000 mL | INTRAMUSCULAR | Status: DC | PRN

## 2018-07-31 MED ORDER — AMIODARONE HCL 100 MG PO TABS
100.0000 mg | ORAL_TABLET | Freq: Every day | ORAL | Status: DC
  Administered 2018-07-31 – 2018-08-01 (×2): 100 mg via ORAL
  Filled 2018-07-31 (×2): qty 1

## 2018-07-31 MED ORDER — MECLIZINE HCL 25 MG PO TABS
25.0000 mg | ORAL_TABLET | Freq: Once | ORAL | Status: AC
  Administered 2018-07-31: 25 mg via ORAL
  Filled 2018-07-31: qty 1

## 2018-07-31 MED ORDER — MIDODRINE HCL 5 MG PO TABS: 10.0000 mg | ORAL_TABLET | ORAL | Status: DC

## 2018-07-31 MED ORDER — COLCHICINE 0.6 MG PO TABS
0.6000 mg | ORAL_TABLET | Freq: Once | ORAL | Status: AC
  Administered 2018-07-31: 0.6 mg via ORAL
  Filled 2018-07-31: qty 1

## 2018-07-31 MED ORDER — LOPERAMIDE HCL 2 MG PO CAPS
2.0000 mg | ORAL_CAPSULE | Freq: Once | ORAL | Status: AC
  Administered 2018-07-31: 2 mg via ORAL
  Filled 2018-07-31: qty 1

## 2018-07-31 MED ORDER — ACETAMINOPHEN 325 MG PO TABS: 650.0000 mg | ORAL_TABLET | Freq: Four times a day (QID) | ORAL | Status: DC | PRN

## 2018-07-31 MED ORDER — SODIUM CHLORIDE 0.9 % IV BOLUS
250.0000 mL | Freq: Once | INTRAVENOUS | Status: AC
  Administered 2018-07-31: 250 mL via INTRAVENOUS

## 2018-07-31 MED ORDER — ZOLPIDEM TARTRATE 5 MG PO TABS: 5.0000 mg | ORAL_TABLET | Freq: Every evening | ORAL | Status: DC | PRN

## 2018-07-31 MED ORDER — AMOXICILLIN 875 MG PO TABS: 875.0000 mg | ORAL_TABLET | Freq: Every day | ORAL | Status: DC

## 2018-07-31 MED ORDER — RENA-VITE PO TABS
1.0000 | ORAL_TABLET | Freq: Every day | ORAL | Status: DC
  Administered 2018-07-31: 1 via ORAL
  Filled 2018-07-31: qty 1

## 2018-07-31 MED ORDER — CALCIUM ACETATE (PHOS BINDER) 667 MG PO CAPS
2001.0000 mg | ORAL_CAPSULE | Freq: Three times a day (TID) | ORAL | Status: DC
  Filled 2018-07-31 (×2): qty 3

## 2018-07-31 MED ORDER — SODIUM CHLORIDE 0.9 % IV SOLN: 100.0000 mL | INTRAVENOUS | Status: DC | PRN

## 2018-07-31 MED ORDER — ALLOPURINOL 300 MG PO TABS
150.0000 mg | ORAL_TABLET | ORAL | Status: DC
  Administered 2018-08-01: 150 mg via ORAL
  Filled 2018-07-31: qty 1

## 2018-07-31 MED ORDER — AMOXICILLIN 500 MG PO CAPS
500.0000 mg | ORAL_CAPSULE | Freq: Once | ORAL | Status: AC
  Administered 2018-07-31: 500 mg via ORAL
  Filled 2018-07-31: qty 1

## 2018-07-31 MED ORDER — MIDODRINE HCL 5 MG PO TABS: 15.0000 mg | ORAL_TABLET | ORAL | Status: DC

## 2018-07-31 MED ORDER — HEPARIN SODIUM (PORCINE) 1000 UNIT/ML DIALYSIS
2000.0000 [IU] | Freq: Once | INTRAMUSCULAR | Status: DC
  Filled 2018-07-31: qty 2

## 2018-07-31 MED ORDER — HYDROCORTISONE ACETATE 25 MG RE SUPP: 25.0000 mg | Freq: Two times a day (BID) | RECTAL | Status: DC

## 2018-07-31 MED ORDER — LIDOCAINE (ANORECTAL) 5 % EX CREA: 1.0000 "application " | TOPICAL_CREAM | Freq: Four times a day (QID) | CUTANEOUS | Status: DC | PRN

## 2018-07-31 MED ORDER — DARBEPOETIN ALFA 60 MCG/0.3ML IJ SOSY
60.0000 ug | PREFILLED_SYRINGE | INTRAMUSCULAR | Status: DC
  Administered 2018-08-01: 60 ug via INTRAVENOUS
  Filled 2018-07-31: qty 0.3

## 2018-07-31 MED ORDER — WITCH HAZEL-GLYCERIN EX PADS
MEDICATED_PAD | CUTANEOUS | Status: DC | PRN
  Filled 2018-07-31: qty 100

## 2018-07-31 MED ORDER — PROMETHAZINE HCL 25 MG PO TABS: 12.5000 mg | ORAL_TABLET | Freq: Four times a day (QID) | ORAL | Status: DC | PRN

## 2018-07-31 MED ORDER — WARFARIN SODIUM 10 MG PO TABS: 10.0000 mg | ORAL_TABLET | Freq: Every day | ORAL | Status: DC

## 2018-07-31 MED ORDER — ATORVASTATIN CALCIUM 40 MG PO TABS
40.0000 mg | ORAL_TABLET | Freq: Every day | ORAL | Status: DC
  Administered 2018-07-31: 40 mg via ORAL
  Filled 2018-07-31: qty 1

## 2018-07-31 MED ORDER — NITROGLYCERIN 0.4 MG SL SUBL: 0.4000 mg | SUBLINGUAL_TABLET | SUBLINGUAL | Status: DC | PRN

## 2018-07-31 MED ORDER — ASPIRIN EC 81 MG PO TBEC
81.0000 mg | DELAYED_RELEASE_TABLET | Freq: Every day | ORAL | Status: DC
  Administered 2018-08-01: 81 mg via ORAL
  Filled 2018-07-31: qty 1

## 2018-07-31 MED ORDER — CALCIUM ACETATE (PHOS BINDER) 667 MG PO CAPS: 667.0000 mg | ORAL_CAPSULE | ORAL | Status: DC

## 2018-07-31 MED ORDER — WARFARIN - PHARMACIST DOSING INPATIENT: Freq: Every day | Status: DC

## 2018-07-31 MED ORDER — PANTOPRAZOLE SODIUM 40 MG PO TBEC
40.0000 mg | DELAYED_RELEASE_TABLET | Freq: Two times a day (BID) | ORAL | Status: DC
  Administered 2018-07-31 – 2018-08-01 (×2): 40 mg via ORAL
  Filled 2018-07-31 (×2): qty 1

## 2018-07-31 MED ORDER — PRO-STAT SUGAR FREE PO LIQD
30.0000 mL | Freq: Two times a day (BID) | ORAL | Status: DC
  Filled 2018-07-31 (×3): qty 30

## 2018-07-31 MED ORDER — FERRIC CITRATE 1 GM 210 MG(FE) PO TABS
210.0000 mg | ORAL_TABLET | Freq: Three times a day (TID) | ORAL | Status: DC
  Administered 2018-07-31 – 2018-08-01 (×2): 210 mg via ORAL
  Filled 2018-07-31 (×5): qty 1

## 2018-07-31 MED ORDER — CALCIUM ACETATE (PHOS BINDER) 667 MG PO CAPS
667.0000 mg | ORAL_CAPSULE | ORAL | Status: DC | PRN
  Filled 2018-07-31: qty 1

## 2018-07-31 MED ORDER — MECLIZINE HCL 25 MG PO TABS
25.0000 mg | ORAL_TABLET | Freq: Three times a day (TID) | ORAL | Status: DC
  Administered 2018-07-31 – 2018-08-01 (×3): 25 mg via ORAL
  Filled 2018-07-31 (×3): qty 1

## 2018-07-31 MED ORDER — WARFARIN SODIUM 10 MG PO TABS
10.0000 mg | ORAL_TABLET | Freq: Once | ORAL | Status: AC
  Administered 2018-07-31: 10 mg via ORAL
  Filled 2018-07-31 (×2): qty 1

## 2018-07-31 MED ORDER — PENTAFLUOROPROP-TETRAFLUOROETH EX AERO
1.0000 "application " | INHALATION_SPRAY | CUTANEOUS | Status: DC | PRN
  Filled 2018-07-31: qty 116

## 2018-07-31 MED ORDER — ACETAMINOPHEN 650 MG RE SUPP: 650.0000 mg | Freq: Four times a day (QID) | RECTAL | Status: DC | PRN

## 2018-07-31 MED ORDER — AMOXICILLIN 500 MG PO CAPS
1000.0000 mg | ORAL_CAPSULE | Freq: Every day | ORAL | Status: DC
  Administered 2018-07-31: 1000 mg via ORAL
  Filled 2018-07-31: qty 2

## 2018-07-31 MED ORDER — LIDOCAINE-PRILOCAINE 2.5-2.5 % EX CREA
1.0000 "application " | TOPICAL_CREAM | CUTANEOUS | Status: DC | PRN
  Filled 2018-07-31: qty 5

## 2018-07-31 MED ORDER — CHLORHEXIDINE GLUCONATE CLOTH 2 % EX PADS: 6.0000 | MEDICATED_PAD | Freq: Every day | CUTANEOUS | Status: DC

## 2018-07-31 MED ORDER — INSULIN ASPART 100 UNIT/ML ~~LOC~~ SOLN: 0.0000 [IU] | Freq: Three times a day (TID) | SUBCUTANEOUS | Status: DC

## 2018-07-31 MED ORDER — MIDODRINE HCL 5 MG PO TABS
15.0000 mg | ORAL_TABLET | Freq: Once | ORAL | Status: AC
  Administered 2018-08-01: 15 mg via ORAL
  Filled 2018-07-31: qty 3

## 2018-07-31 NOTE — ED Triage Notes (Signed)
Pt states that for the past several days he has been having dizziness, room spinning worse this AM, feeling SOB, dialysis pt.

## 2018-07-31 NOTE — Progress Notes (Signed)
Patient does not take calcium acetat d/t upset stomach

## 2018-07-31 NOTE — Progress Notes (Signed)
CSW consulted by PA as pt is needing to return back to [facility] for dialysis. CSW spoke with RN"s there and was informed that the yare able to take pt no later than 2pm today but pt would only be able to get 2 1/2/ hours of dialysis compared to his regular 4 hours. CSW updated MD and RN of this at this time and have provided RN With taxi to Dialysis center at this time. There appears to be no further CSW needs. CSW will sign off.      Virgie Dad. Clarity Ciszek, MSW, [facility] Clinical Social Worker [phone]

## 2018-07-31 NOTE — ED Notes (Signed)
Patient transported to MRI 

## 2018-07-31 NOTE — Consult Note (Addendum)
Reason for Consult: To manage dialysis and dialysis related needs Referring Physician: Dr. Achille Rich Pettigrew is an 58 y.o. male.   HPI: Pt is a 43M with a PMH significant for ESRD on HD, Aflutter/fib on Coumadin, DM, CAD, h/o CVA, chronic hypotension on midodrine who is now seen in consultation at the request of Dr. Andria Frames for evaluation and recommendations surrounding ESRD and provision of HD.  Pt was in his usual state of health until Friday after dialysis.  Started having dizziness- feels like the room is spinning, like he's on a "merry-go-round".  No associated weakness/ numbness/ tingling.  Worse when he turns his head to the L.  No tinnitus or hearing loss.  No h/o viral URI.  Was stumbling around, unable to walk, so pt's wife brought him to the ED today.  Of note, pt was noted to be hypotensive at home per wife Friday and Saturday, BP in the 86V systolic.  Normally takes midodrine.    On ROS, pt and wife also report epigastric discomfort, present for 1-2 weeks.  No f/c, n/v, CP, SOB.  On antibiotics for perianal fistula.  Has some L wrist pain.  Presented to dialysis today with BP 112/ 96 with HR 89 but was noted to be weak and stumbling around so was sent to ED.  He reported dark stools as well (but is on iron-containing binders).   On arrival to ED, pt was noted to have BP 98/ 73.  K 5.7 (hemolyzed) --> 5.1, CO2 22, BUN 54. Cr 12.77, Ca 9.0, WBC 5.6, Hgb 10.6, Hct 33.8, plts 180, INR 1.9.  EKG NSR without acute ischemic changes, CT head lacunar infarcts, MRI incomplete exam d/t pt intolerance of MRI.  CXR with low lung volumes.  In this setting we are asked to see.  Dialyzes at [facility]  MWF 4 hrs 450/800 EDW 106 HD Bath 2K 2.0 Ca, Dialyzer F180, Heparin 2000 u bolus. Access L FA AVF. hectorol 4 mcg TIW Mircera 50 mcg q 2 weeks (to start today) parsabiv 12.5 TIW  Past Medical History:  Diagnosis Date  . Atrial flutter ([place])   . Blind right eye    Hx: of partial blindness in right  eye  . Cardiomyopathy   . CHF (congestive heart failure) ([place])   . Coronary artery disease    normal coronaries by 10/10/08 cath, Cardiac Cath 08-04-12 epic.Dr. Doylene Canard follows  . Diabetes mellitus    pt. states he's borderline diabetic., no longer taking med,- off med. since 2013  . Dialysis patient [facility])    Mon-Wed-Fri([place])- Left AV fistula  . Diverticulitis November 2016   reoccurred in December 2016  . ESRD (end stage renal disease) ([place])   . GERD (gastroesophageal reflux disease)   . Gout   . History of nephrectomy 07/04/2012   History of right nephrectomy in 2002 for renal cell carcinoma   . History of unilateral nephrectomy   . Hypertension   . Low iron   . Myocardial infarction [facility]) ?2006  . Renal cell carcinoma    dialysis- MWF- Dr. Mercy Moore follows.  . Renal insufficiency   . Shortness of breath 05/19/11   "at rest, lying down, w/exertion"  . Stroke [facility]) 02/2011   05/19/11 denies residual  . Umbilical hernia 78/46/96   unrepaired    Past Surgical History:  Procedure Laterality Date  . AV FISTULA PLACEMENT  03/29/2011   Procedure: ARTERIOVENOUS (AV) FISTULA CREATION;  Surgeon: Hinda Lenis, MD;  Location: [facility]  OR;  Service: Vascular;  Laterality: Left;  LEFT RADIOCEPHALIC , Arteriovenous [id number])  . CARDIAC CATHETERIZATION  05/21/11  . CARDIAC CATHETERIZATION N/A 03/16/2016   Procedure: Left Heart Cath and Coronary Angiography;  Surgeon: Dixie Dials, MD;  Location: [place] CV LAB;  Service: Cardiovascular;  Laterality: N/A;  . CHOLECYSTECTOMY N/A 02/12/2016   Procedure: LAPAROSCOPIC CHOLECYSTECTOMY WITH INTRAOPERATIVE CHOLANGIOGRAM;  Surgeon: Jackolyn Confer, MD;  Location: [place];  Service: General;  Laterality: N/A;  . COLONOSCOPY WITH PROPOFOL N/A 02/01/2017   Procedure: COLONOSCOPY WITH PROPOFOL;  Surgeon: Juanita Craver, MD;  Location: [facility];  Service: Endoscopy;  Laterality: N/A;  . dialysis cath placed    .  ESOPHAGOGASTRODUODENOSCOPY (EGD) WITH PROPOFOL N/A 12/02/2015   Procedure: ESOPHAGOGASTRODUODENOSCOPY (EGD) WITH PROPOFOL;  Surgeon: Juanita Craver, MD;  Location: [facility];  Service: Endoscopy;  Laterality: N/A;  . FINGER SURGERY     L pinkie finger- ORIF- /w remaining hardware - 1990's    . HERNIA REPAIR N/A 5/00/9381   Umbilical hernia repair  . INSERTION OF DIALYSIS CATHETER N/A 01/27/2016   Procedure: INSERTION OF DIALYSIS CATHETER;  Surgeon: Waynetta Sandy, MD;  Location: [place];  Service: Vascular;  Laterality: N/A;  . INSERTION OF MESH N/A 07/04/2012   Procedure: INSERTION OF MESH;  Surgeon: Madilyn Hook, DO;  Location: [place];  Service: General;  Laterality: N/A;  . IR EMBO ART  VEN HEMORR LYMPH EXTRAV  INC GUIDE ROADMAPPING  01/13/2018  . IR FLUORO GUIDE CV LINE RIGHT  01/13/2018  . IR IVC FILTER PLMT / S&I /IMG GUID/MOD SED  01/27/2018  . IR RENAL SELECTIVE  UNI INC S&I MOD SED  01/13/2018  . IR US GUIDE VASC ACCESS RIGHT  01/13/2018  . LAPAROSCOPIC LYSIS OF ADHESIONS N/A 02/12/2016   Procedure: LAPAROSCOPIC LYSIS OF ADHESIONS;  Surgeon: Jackolyn Confer, MD;  Location: [place];  Service: General;  Laterality: N/A;  . LEFT AND RIGHT HEART CATHETERIZATION WITH CORONARY ANGIOGRAM N/A 08/04/2012   Procedure: LEFT AND RIGHT HEART CATHETERIZATION WITH CORONARY ANGIOGRAM;  Surgeon: Birdie Riddle, MD;  Location: [place] CATH LAB;  Service: Cardiovascular;  Laterality: N/A;  . NEPHRECTOMY  2000   right  . REVISON OF ARTERIOVENOUS FISTULA Left 06/05/2013   Procedure: REVISON OF LEFT RADIOCEPHALIC  ARTERIOVENOUS FISTULA;  Surgeon: Conrad Milford, MD;  Location: [place];  Service: Vascular;  Laterality: Left;  . REVISON OF ARTERIOVENOUS FISTULA Left 01/27/2016   Procedure: REVISION OF LEFT UPPER EXTREMITY ARTERIOVENOUS FISTULA;  Surgeon: Waynetta Sandy, MD;  Location: [place];  Service: Vascular;  Laterality: Left;  . REVISON OF ARTERIOVENOUS FISTULA Left 08/03/2016   Procedure: REVISON OF Left arm  ARTERIOVENOUS FISTULA;  Surgeon: Elam Dutch, MD;  Location: [place];  Service: Vascular;  Laterality: Left;  . REVISON OF ARTERIOVENOUS FISTULA Left 05/06/2017   Procedure: REVISION PLICATION OF ARTERIOVENOUS FISTULA LEFT ARM;  Surgeon: Rosetta Posner, MD;  Location: [place];  Service: Vascular;  Laterality: Left;  . RIGHT HEART CATHETERIZATION N/A 05/21/2011   Procedure: RIGHT HEART CATH;  Surgeon: Birdie Riddle, MD;  Location: [facility] CATH LAB;  Service: Cardiovascular;  Laterality: N/A;  . smashed  1990's   "left pinky; have a plate in there"  . UMBILICAL HERNIA REPAIR N/A 07/04/2012   Procedure: LAPAROSCOPIC UMBILICAL HERNIA;  Surgeon: Madilyn Hook, DO;  Location: [place];  Service: General;  Laterality: N/A;  . US ECHOCARDIOGRAPHY  05/20/11    Family History  Problem Relation Age of Onset  .  Hypertension Mother   . Kidney disease Mother     Social History:  reports that he has never smoked. He has never used smokeless tobacco. He reports that he does not drink alcohol or use drugs.  Allergies:  Allergies  Allergen Reactions  . Ace Inhibitors Itching and Cough    Medications:  Scheduled: . allopurinol  150 mg Oral QODAY  . amiodarone  100 mg Oral Daily  . [START ON 08/01/2018] amoxicillin  875 mg Oral Daily  . aspirin EC  81 mg Oral Daily  . atorvastatin  40 mg Oral q1800  . calcium acetate  667-2,001 mg Oral See admin instructions  . feeding supplement (PRO-STAT SUGAR FREE 64)  30 mL Oral BID  . ferric citrate  210 mg Oral TID WC  . insulin aspart  0-9 Units Subcutaneous TID WC  . meclizine  25 mg Oral TID  . midodrine  10-15 mg Oral See admin instructions  . mirtazapine  7.5 mg Oral QHS  . multivitamin  1 tablet Oral QHS  . pantoprazole  40 mg Oral BID  . warfarin  10 mg Oral ONCE-1800  . Warfarin - Pharmacist Dosing Inpatient   Does not apply q1800     Results for orders placed or performed during the hospital encounter of 07/31/18 (from the past 48 hour(s))  Basic  metabolic panel     Status: Abnormal   Collection Time: 07/31/18  7:05 AM  Result Value Ref Range   Sodium 133 (L) 135 - 145 mmol/L   Potassium 5.7 (H) 3.5 - 5.1 mmol/L    Comment: SLIGHT HEMOLYSIS   Chloride 95 (L) 98 - 111 mmol/L   CO2 22 22 - 32 mmol/L   Glucose, Bld 86 70 - 99 mg/dL   BUN 54 (H) 6 - 20 mg/dL   Creatinine, Ser 12.77 (H) 0.61 - 1.24 mg/dL   Calcium 9.0 8.9 - 10.3 mg/dL   GFR calc non Af Amer 4 (L) >60 mL/min   GFR calc Af Amer 4 (L) >60 mL/min   Anion gap 16 (H) 5 - 15    Comment: Performed at [facility], [address], [place], [place] [address]  CBC     Status: Abnormal   Collection Time: 07/31/18  7:05 AM  Result Value Ref Range   WBC 5.7 4.0 - 10.5 K/uL   RBC 3.38 (L) 4.22 - 5.81 MIL/uL   Hemoglobin 10.6 (L) 13.0 - 17.0 g/dL   HCT 33.8 (L) 39.0 - 52.0 %   MCV 100.0 80.0 - 100.0 fL   MCH 31.4 26.0 - 34.0 pg   MCHC 31.4 30.0 - 36.0 g/dL   RDW 17.1 (H) 11.5 - 15.5 %   Platelets 180 150 - 400 K/uL   nRBC 0.0 0.0 - 0.2 %    Comment: Performed at [facility], [place] [address] [place], [place] [address]  Protime-INR     Status: Abnormal   Collection Time: 07/31/18  7:05 AM  Result Value Ref Range   Prothrombin Time 21.7 (H) 11.4 - 15.2 seconds   INR 1.9 (H) 0.8 - 1.2    Comment: (NOTE) INR goal varies based on device and disease states. Performed at [facility], [place] [address] [place], [place] [address]   Potassium     Status: None   Collection Time: 07/31/18  8:48 AM  Result Value Ref Range   Potassium 5.1 3.5 - 5.1 mmol/L    Comment: Performed  at [facility], [place] [address], [place], [place] [address]    Dg Chest 2 View  Result Date: 07/31/2018 CLINICAL DATA:  Shortness of breath and dizziness EXAM: CHEST - 2 VIEW COMPARISON:  01/27/2018 FINDINGS: Low volume chest with interstitial crowding. There is no edema, consolidation, effusion, or pneumothorax. Chronic cardiomegaly. IMPRESSION: 1. No evidence of acute disease. 2.  Cardiomegaly. Electronically Signed   By: Monte Fantasia M.D.   On: 07/31/2018 06:48   Ct Head Wo Contrast  Result Date: 07/31/2018 CLINICAL DATA:  Episodic vertigo.  Renal failure EXAM: CT HEAD WITHOUT CONTRAST TECHNIQUE: Contiguous axial images were obtained from the base of the skull through the vertex without intravenous contrast. COMPARISON:  February 01, 2018 FINDINGS: Brain: The ventricles and sulci are within normal limits for age. There is no intracranial mass, hemorrhage, extra-axial fluid collection, or midline shift. There is mild small vessel disease in the centra semiovale bilaterally. There are occasional nonacute appearing lacunar type infarcts within each centrum semiovale. There is evidence of a prior infarct in the medial right upper pons, stable. No acute infarct is demonstrable on this study. Vascular: No hyperdense vessel. There is calcification in each carotid siphon and distal left vertebral artery. The basilar artery is somewhat dolichoectatic, stable. Skull: Bony calvarium appears intact. Sinuses/Orbits: There is mucosal thickening in several ethmoid air cells. Other visualized paranasal sinuses are clear. Orbits appear symmetric bilaterally. Other: There is mucosal thickening in several medial mastoid air cells bilaterally. The middle and inner ear regions appear symmetric bilaterally. IMPRESSION: Scattered supratentorial and infratentorial lacunar type infarcts. No acute infarct evident. There is mild periventricular small vessel disease. No mass or hemorrhage. Multiple foci of arterial vascular calcification noted. There is mucosal thickening in several ethmoid air cells. There is mucosal thickening in several medial mastoid air cells bilaterally. Electronically Signed   By: Lowella Grip III M.D.   On: 07/31/2018 07:13   Mr Brain Wo Contrast  Result Date: 07/31/2018 CLINICAL DATA:  Vertigo persistent, central EXAM: MRI HEAD WITHOUT CONTRAST TECHNIQUE: Multiplanar, multiecho  pulse sequences of the brain and surrounding structures were obtained without intravenous contrast. COMPARISON:  CT head 07/31/2018 FINDINGS: Incomplete exam. The patient refused to complete the exam. Sagittal T1 and axial and coronal diffusion sequences performed Negative for acute infarct. Ventricle size normal. Chronic infarcts in the pons bilaterally and in the white matter bilaterally. IMPRESSION: Incomplete exam.  Negative for acute infarct. Electronically Signed   By: Franchot Gallo M.D.   On: 07/31/2018 11:27    ROS: all other systems reviewed and are negative except as per HPI Blood pressure 102/68, pulse 84, temperature 97.8 F (36.6 C), temperature source Oral, resp. rate 18, SpO2 100 %. .  GEN NAD, sitting in bed HEENT  EOMI PERRL NECK no JVD PULM normal WOB, clear bilaterally CV RRR no m/r/g ABD soft, mildly tender in epigastrum, no r/g EXT: trace LE edema NEURO AAO x 3, nonfocal- reported dizziness when turning head to L but no observed nystagmus SKIN no rashes MSK tender L wrist without erythema, sl swollen (not near fistula) ACCESS: L FA AVF + T/B, areas of thinned skin (recent intervention)  Assessment/Plan: 1 Vertigo/ dizziness- sounds like BPPV vs cerebellar infarct but not able to get full MRI d/t intolerance.  Given h/o CVA, likely will need to complete- per primary.  May need vestibular rehab pending MRI results.  2 ESRD: MWF normally- will do 1st shift tomorrow d/t pt volume 3 Hypotension: gets midodrine 15  TID on HD days and 10 TID non-dialysis days 4. Anemia of ESRD: Hgb 10.5, will order ESA with HD tomorrow 5. Metabolic Bone Disease: on velphoro and auryxia- will do velphoro here and follow P (hopefully can get off one binder) 6.  Nutrition- renal diet 7.  Epigastric pain- per primary, ? If needs scope, h/o dark stools hard to interpret in setting of iron-containing binders, Hgb as OP largely stable 8.  L wrist pain- ? Gout- will do one dose of colchicine. 9.   Afib/flutter- on coumadin 10.  Dispo: pending  [name] 07/31/2018, 4:19 PM

## 2018-07-31 NOTE — ED Notes (Signed)
Patient transported to X-ray 

## 2018-07-31 NOTE — Progress Notes (Signed)
ANTICOAGULATION CONSULT NOTE - Initial Consult  Pharmacy Consult for warfarin Indication: atrial fibrillation  Allergies  Allergen Reactions  . Ace Inhibitors Itching and Cough   Vital Signs: Temp: 97.8 F (36.6 C) (03/09 0611) Temp Source: Oral (03/09 0611) BP: 102/68 (03/09 1530) Pulse Rate: 84 (03/09 1530)  Labs: Recent Labs    07/31/18 0705  HGB 10.6*  HCT 33.8*  PLT 180  LABPROT 21.7*  INR 1.9*  CREATININE 12.77*    Estimated Creatinine Clearance: 7.5 mL/min (A) (by C-G formula based on SCr of 12.77 mg/dL (H)).   Medical History: Past Medical History:  Diagnosis Date  . Atrial flutter ([place])   . 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 Cardiomyopathy   . CHF (congestive heart failure) ([place])   . Coronary artery disease    normal coronaries by 10/10/08 cath, Cardiac Cath 08-04-12 epic.Dr. Doylene Canard follows  . Diabetes mellitus    pt. states he's borderline diabetic., no longer taking med,- off med. since 2013  . Dialysis patient [facility])    Mon-Wed-Fri([place])- Left AV fistula  . Diverticulitis November 2016   reoccurred in December 2016  . ESRD (end stage renal disease) ([place])   . GERD (gastroesophageal reflux disease)   . Gout   . History of nephrectomy 07/04/2012   History of right nephrectomy in 2002 for renal cell carcinoma   . History of unilateral nephrectomy   . Hypertension   . Low iron   . Myocardial infarction [facility]) ?2006  . Renal cell carcinoma    dialysis- MWF- Dr. Mercy Moore follows.  . Renal insufficiency   . Shortness of breath 05/19/11   "at rest, lying down, w/exertion"  . Stroke [facility]) 02/2011   05/19/11 denies residual  . Umbilical hernia 82/99/37   unrepaired   Assessment: 75 yoM admitted 3/9 for vertigo. Patient on warfarin PTA for PAF. Pharmacy has been consulted for warfarin dosing. Patient last reports taking warfarin on 07/30/18. INR slighly subtherapeutic on admission at 1.9. CBC stable -  Hgb 10.6,  HCT 33.8 and pltc 180 on admission.      PTA dose: warfarin 10 mg PO daily  Goal of Therapy:  INR 2-3   Plan:  Give Warfarin 10 mg x 1 tonight Monitor daily INR and for s/sx of bleeding  Thank you for allowing pharmacy to be a part of this patient's care.  Leron Croak, PharmD PGY1 Pharmacy Resident Please check AMION for all [place] phone numbers  07/31/2018,4:10 PM

## 2018-07-31 NOTE — H&P (Signed)
History and Physical    DOA: 07/31/2018  PCP: Shelby Mattocks, PA-C  Patient coming from: home  Chief Complaint: vertigo  HPI: [name] is a 57 y.o. male with history h/o ESRD on hemodialysis MWF, paroxysmal atrial flutter on anticoagulation with Coumadin, renal cell carcinoma status post right-sided nephrectomy, left renal cyst with complication of rupture in September 2019, anorectal abscess/fistula being followed by general surgery and scheduled for repair on March 24, history of PE status post IVC filter and anticoagulation, systolic CHF with EF of 30 to 35% presents today with complaints of dizzy spells described as spinning sensation going on for 3 days.  Patient does have a history of orthostasis and chronic hypotension requiring midodrine (with baseline systolic blood pressure before dialysis around 70s to 80s and postdialysis dropping as low as systolic 90W in spite of taking Midodrin) but usually experiences it as a lightheaded feeling.  Patient states for the last 2 days he has been experiencing a right-to-left spinning sensation "like I am in a merry-go-round" not associated with headache, earache, tinnitus, nausea or vomiting.  He reports exacerbation of symptoms when he flexes his head or looks to the left side.  Patient states today he drove himself to dialysis in spite of feeling vertiginous although he was in a pretty bad shape by the time he got to the parking lot and severely ataxic walking into the dialysis center.  He denies falling but noted by staff to be severely symptomatic and referred here for further evaluation.  Patient systolic blood pressure has fluctuated between 95-110 during the ED stay which is better than his norm.  He was unable to lay flat for the entirety of MRI due to shortness of breath.  He sleeps in a chair at 45 degrees incline even at baseline. He denies any tingling or numbness or weakness in extremities.  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  Work-up in the ED revealed no significant lab abnormalities compared to baseline, INR is at 1.9, CT head reported several supratentorial lacunar infarcts and MRI of the head reported negative for acute infarct although it was an incomplete exam.   Review of Systems: As per HPI otherwise 10 point review of systems negative.    Past Medical History:  Diagnosis Date  . Atrial flutter ([place])   . 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 Cardiomyopathy   . CHF (congestive heart failure) ([place])   . Coronary artery disease    normal coronaries by 10/10/08 cath, Cardiac Cath 08-04-12 epic.Dr. Doylene Canard follows  . Diabetes mellitus    pt. states he's borderline diabetic., no longer taking med,- off med. since 2013  . Dialysis patient [facility])    Mon-Wed-Fri([place])- Left AV fistula  . Diverticulitis November 2016   reoccurred in December 2016  . ESRD (end stage renal disease) ([place])   . GERD (gastroesophageal reflux disease)   . Gout   . History of nephrectomy 07/04/2012   History of right nephrectomy in 2002 for renal cell carcinoma   . History of unilateral nephrectomy   . Hypertension   . Low iron   . Myocardial infarction [facility]) ?2006  . Renal cell carcinoma    dialysis- MWF- Dr. Mercy Moore follows.  . Renal insufficiency   . Shortness of breath 05/19/11   "at rest, lying down, w/exertion"  . Stroke [facility]) 02/2011   05/19/11 denies residual  . Umbilical hernia  05/19/11   unrepaired    Past Surgical History:  Procedure Laterality Date  . AV FISTULA PLACEMENT  03/29/2011   Procedure: ARTERIOVENOUS (AV) FISTULA CREATION;  Surgeon: Hinda Lenis, MD;  Location: [place];  Service: Vascular;  Laterality: Left;  LEFT RADIOCEPHALIC , Arteriovenous [id number])  . CARDIAC CATHETERIZATION  05/21/11  . CARDIAC CATHETERIZATION N/A 03/16/2016   Procedure: Left Heart Cath and Coronary Angiography;  Surgeon: Dixie Dials, MD;   Location: [place] CV LAB;  Service: Cardiovascular;  Laterality: N/A;  . CHOLECYSTECTOMY N/A 02/12/2016   Procedure: LAPAROSCOPIC CHOLECYSTECTOMY WITH INTRAOPERATIVE CHOLANGIOGRAM;  Surgeon: Jackolyn Confer, MD;  Location: [place];  Service: General;  Laterality: N/A;  . COLONOSCOPY WITH PROPOFOL N/A 02/01/2017   Procedure: COLONOSCOPY WITH PROPOFOL;  Surgeon: Juanita Craver, MD;  Location: [facility];  Service: Endoscopy;  Laterality: N/A;  . dialysis cath placed    . ESOPHAGOGASTRODUODENOSCOPY (EGD) WITH PROPOFOL N/A 12/02/2015   Procedure: ESOPHAGOGASTRODUODENOSCOPY (EGD) WITH PROPOFOL;  Surgeon: Juanita Craver, MD;  Location: [facility];  Service: Endoscopy;  Laterality: N/A;  . FINGER SURGERY     L pinkie finger- ORIF- /w remaining hardware - 1990's    . HERNIA REPAIR N/A 5/78/4696   Umbilical hernia repair  . INSERTION OF DIALYSIS CATHETER N/A 01/27/2016   Procedure: INSERTION OF DIALYSIS CATHETER;  Surgeon: Waynetta Sandy, MD;  Location: [place];  Service: Vascular;  Laterality: N/A;  . INSERTION OF MESH N/A 07/04/2012   Procedure: INSERTION OF MESH;  Surgeon: Madilyn Hook, DO;  Location: [place];  Service: General;  Laterality: N/A;  . IR EMBO ART  VEN HEMORR LYMPH EXTRAV  INC GUIDE ROADMAPPING  01/13/2018  . IR FLUORO GUIDE CV LINE RIGHT  01/13/2018  . IR IVC FILTER PLMT / S&I /IMG GUID/MOD SED  01/27/2018  . IR RENAL SELECTIVE  UNI INC S&I MOD SED  01/13/2018  . IR US GUIDE VASC ACCESS RIGHT  01/13/2018  . LAPAROSCOPIC LYSIS OF ADHESIONS N/A 02/12/2016   Procedure: LAPAROSCOPIC LYSIS OF ADHESIONS;  Surgeon: Jackolyn Confer, MD;  Location: [place];  Service: General;  Laterality: N/A;  . LEFT AND RIGHT HEART CATHETERIZATION WITH CORONARY ANGIOGRAM N/A 08/04/2012   Procedure: LEFT AND RIGHT HEART CATHETERIZATION WITH CORONARY ANGIOGRAM;  Surgeon: Birdie Riddle, MD;  Location: [place] CATH LAB;  Service: Cardiovascular;  Laterality: N/A;  . NEPHRECTOMY  2000   right  . REVISON OF ARTERIOVENOUS  FISTULA Left 06/05/2013   Procedure: REVISON OF LEFT RADIOCEPHALIC  ARTERIOVENOUS FISTULA;  Surgeon: Conrad Darwin, MD;  Location: [place];  Service: Vascular;  Laterality: Left;  . REVISON OF ARTERIOVENOUS FISTULA Left 01/27/2016   Procedure: REVISION OF LEFT UPPER EXTREMITY ARTERIOVENOUS FISTULA;  Surgeon: Waynetta Sandy, MD;  Location: [place];  Service: Vascular;  Laterality: Left;  . REVISON OF ARTERIOVENOUS FISTULA Left 08/03/2016   Procedure: REVISON OF Left arm ARTERIOVENOUS FISTULA;  Surgeon: Elam Dutch, MD;  Location: [place];  Service: Vascular;  Laterality: Left;  . REVISON OF ARTERIOVENOUS FISTULA Left 05/06/2017   Procedure: REVISION PLICATION OF ARTERIOVENOUS FISTULA LEFT ARM;  Surgeon: Rosetta Posner, MD;  Location: [place];  Service: Vascular;  Laterality: Left;  . RIGHT HEART CATHETERIZATION N/A 05/21/2011   Procedure: RIGHT HEART CATH;  Surgeon: Birdie Riddle, MD;  Location: [facility] CATH LAB;  Service: Cardiovascular;  Laterality: N/A;  . smashed  1990's   "left pinky; have a plate in there"  . UMBILICAL HERNIA REPAIR N/A 07/04/2012  Procedure: LAPAROSCOPIC UMBILICAL HERNIA;  Surgeon: Madilyn Hook, DO;  Location: [place];  Service: General;  Laterality: N/A;  . US ECHOCARDIOGRAPHY  05/20/11    Social history:  reports that he has never smoked. He has never used smokeless tobacco. He reports that he does not drink alcohol or use drugs.   Allergies  Allergen Reactions  . Ace Inhibitors Itching and Cough    Family History  Problem Relation Age of Onset  . Hypertension Mother   . Kidney disease Mother       Prior to Admission medications   Medication Sig Start Date End Date Taking? Authorizing Provider  allopurinol (ZYLOPRIM) 300 MG tablet Take 150 mg by mouth every other day.   Yes [provider]  Amino Acids-Protein Hydrolys (FEEDING SUPPLEMENT, PRO-STAT SUGAR FREE 64,) LIQD Take 30 mLs by mouth 2 (two) times daily. 02/11/18  Yes Oretha Milch D, MD    amiodarone (PACERONE) 200 MG tablet Take 0.5 tablets (100 mg total) by mouth daily. 07/21/17  Yes Regalado, Belkys A, MD  amoxicillin (AMOXIL) 875 MG tablet Take 875 mg by mouth daily. 07/30/18  Yes [provider]  calcium acetate (PHOSLO) 667 MG capsule Take 667-2,001 mg by mouth See admin instructions. Take 2001 mg  capsules with meals and 800 mg capsule with snacks. 01/24/18  Yes [provider]  chlorhexidine (PERIDEX) 0.12 % solution 15 mLs by Mouth Rinse route daily as needed. Mouth rinse 05/29/18  Yes [provider]  doxercalciferol (HECTOROL) 4 MCG/2ML injection Inject 1.5 mLs (3 mcg total) into the vein every Monday, Wednesday, and Friday with hemodialysis. 06/23/18  Yes Dixie Dials, MD  ferric citrate (AURYXIA) 1 GM 210 MG(Fe) tablet Take 210 mg by mouth 3 (three) times daily with meals.    Yes [provider]  isosorbide dinitrate (ISORDIL) 20 MG tablet Take 20 mg by mouth 2 (two) times daily.   Yes [provider]  lidocaine-prilocaine (EMLA) cream Apply 1 application topically See admin instructions. Apply topically to port access prior to dialysis - Monday, Wednesday, Friday 04/08/15  Yes [provider]  midodrine (PROAMATINE) 10 MG tablet 30 mg  on the days of dyalisis three times a week, and only takes 10 mg on off dyalisis days. Patient taking differently: Take 10-15 mg by mouth See admin instructions. 15 mg  on the days of dyalisis three times a week, and only takes 10 mg on off dyalisis days. 07/18/18  Yes Rodriguez-Southworth, Sunday Spillers, PA-C  mirtazapine (REMERON) 7.5 MG tablet Take 1 tablet (7.5 mg total) by mouth at bedtime. 01/23/18  Yes Emokpae, Courage, MD  multivitamin (RENA-VIT) TABS tablet Take 1 tablet by mouth at bedtime.   Yes [provider]  nitroGLYCERIN (NITROSTAT) 0.4 MG SL tablet Place 0.4 mg under the tongue every 5 (five) minutes as needed for chest pain.  05/20/15  Yes [provider]  pantoprazole  (PROTONIX) 40 MG tablet Take 40 mg by mouth 2 (two) times daily.   Yes [provider]  warfarin (COUMADIN) 10 MG tablet Take 1 tablet (10 mg total) by mouth daily at 6 PM. 06/22/18  Yes Dixie Dials, MD  hydrocortisone (ANUSOL-HC) 25 MG suppository Place 1 suppository (25 mg total) rectally every 12 (twelve) hours. Patient not taking: Reported on 07/31/2018 07/05/18 07/05/19  Rodriguez-Southworth, Sunday Spillers, PA-C  Lidocaine, Anorectal, (L-M-X 5) 5 % CREA Apply 1 application topically 4 (four) times daily as needed (apply to area up to 6 times daily as needed). 07/09/18  Gareth Morgan, MD    Physical Exam: Vitals:   07/31/18 1245 07/31/18 1300 07/31/18 1315 07/31/18 1330  BP: 111/68 103/78 103/67 102/72  Pulse: 82 82 82 82  Resp: (!) 21 18 (!) 21 (!) 21  Temp:      TempSrc:      SpO2: 100% 100% 100% 100%    Constitutional: NAD, calm, comfortable Eyes: PERRL, lids and conjunctivae normal.  Legally blind in right eye.  Mild left lateral nystagmus, no ptosis.  Extraocular movements intact in the left eye. ENMT: Mucous membranes are moist. Posterior pharynx clear of any exudate or lesions.Normal dentition.  Neck: normal, supple, no masses, no thyromegaly Respiratory: clear to auscultation bilaterally, no wheezing, no crackles. Normal respiratory effort. No accessory muscle use.  Cardiovascular: Regular rate and rhythm, no murmurs / rubs / gallops. No extremity edema. 2+ pedal pulses. No carotid bruits.  Abdomen: no tenderness, no masses palpated. No hepatosplenomegaly. Bowel sounds positive.  Musculoskeletal: no clubbing / cyanosis. No joint deformity upper and lower extremities. Good ROM, no contractures. Normal muscle tone.  Neurologic: CN 2-12 grossly intact. Sensation intact, DTR normal. Strength 5/5 in all 4.  Psychiatric: Normal judgment and insight. Alert and oriented x 3. Normal mood.  SKIN/catheters: no rashes, lesions, ulcers.  Status post AV fistula on left forearm.  Labs on  Admission: I have personally reviewed following labs and imaging studies  CBC: Recent Labs  Lab 07/31/18 0705  WBC 5.7  HGB 10.6*  HCT 33.8*  MCV 100.0  PLT 174   Basic Metabolic Panel: Recent Labs  Lab 07/31/18 0705 07/31/18 0848  NA 133*  --   K 5.7* 5.1  CL 95*  --   CO2 22  --   GLUCOSE 86  --   BUN 54*  --   CREATININE 12.77*  --   CALCIUM 9.0  --    GFR: Estimated Creatinine Clearance: 7.5 mL/min (A) (by C-G formula based on SCr of 12.77 mg/dL (H)). Liver Function Tests: No results for input(s): AST, ALT, ALKPHOS, BILITOT, PROT, ALBUMIN in the last 168 hours. No results for input(s): LIPASE, AMYLASE in the last 168 hours. No results for input(s): AMMONIA in the last 168 hours. Coagulation Profile: Recent Labs  Lab 07/31/18 0705  INR 1.9*   Cardiac Enzymes: No results for input(s): CKTOTAL, CKMB, CKMBINDEX, TROPONINI in the last 168 hours. BNP (last 3 results) No results for input(s): PROBNP in the last 8760 hours. HbA1C: No results for input(s): HGBA1C in the last 72 hours. CBG: No results for input(s): GLUCAP in the last 168 hours. Lipid Profile: No results for input(s): CHOL, HDL, LDLCALC, TRIG, CHOLHDL, LDLDIRECT in the last 72 hours. Thyroid Function Tests: No results for input(s): TSH, T4TOTAL, FREET4, T3FREE, THYROIDAB in the last 72 hours. Anemia Panel: No results for input(s): VITAMINB12, FOLATE, FERRITIN, TIBC, IRON, RETICCTPCT in the last 72 hours. Urine analysis:    Component Value Date/Time   COLORURINE YELLOW 05/19/2011 [place] 05/19/2011 1042   LABSPEC 1.013 05/19/2011 1042   PHURINE 6.5 05/19/2011 1042   GLUCOSEU NEGATIVE 05/19/2011 1042   HGBUR TRACE (A) 05/19/2011 1042   BILIRUBINUR NEGATIVE 05/19/2011 1042   KETONESUR NEGATIVE 05/19/2011 1042   PROTEINUR >300 (A) 05/19/2011 1042   UROBILINOGEN 0.2 05/19/2011 1042   NITRITE NEGATIVE 05/19/2011 1042   LEUKOCYTESUR NEGATIVE 05/19/2011 1042    Radiological  Exams on Admission: Dg Chest 2 View  Result Date: 07/31/2018 CLINICAL DATA:  Shortness of breath and dizziness  EXAM: CHEST - 2 VIEW COMPARISON:  01/27/2018 FINDINGS: Low volume chest with interstitial crowding. There is no edema, consolidation, effusion, or pneumothorax. Chronic cardiomegaly. IMPRESSION: 1. No evidence of acute disease. 2. Cardiomegaly. Electronically Signed   By: Monte Fantasia M.D.   On: 07/31/2018 06:48   Ct Head Wo Contrast  Result Date: 07/31/2018 CLINICAL DATA:  Episodic vertigo.  Renal failure EXAM: CT HEAD WITHOUT CONTRAST TECHNIQUE: Contiguous axial images were obtained from the base of the skull through the vertex without intravenous contrast. COMPARISON:  February 01, 2018 FINDINGS: Brain: The ventricles and sulci are within normal limits for age. There is no intracranial mass, hemorrhage, extra-axial fluid collection, or midline shift. There is mild small vessel disease in the centra semiovale bilaterally. There are occasional nonacute appearing lacunar type infarcts within each centrum semiovale. There is evidence of a prior infarct in the medial right upper pons, stable. No acute infarct is demonstrable on this study. Vascular: No hyperdense vessel. There is calcification in each carotid siphon and distal left vertebral artery. The basilar artery is somewhat dolichoectatic, stable. Skull: Bony calvarium appears intact. Sinuses/Orbits: There is mucosal thickening in several ethmoid air cells. Other visualized paranasal sinuses are clear. Orbits appear symmetric bilaterally. Other: There is mucosal thickening in several medial mastoid air cells bilaterally. The middle and inner ear regions appear symmetric bilaterally. IMPRESSION: Scattered supratentorial and infratentorial lacunar type infarcts. No acute infarct evident. There is mild periventricular small vessel disease. No mass or hemorrhage. Multiple foci of arterial vascular calcification noted. There is mucosal thickening in  several ethmoid air cells. There is mucosal thickening in several medial mastoid air cells bilaterally. Electronically Signed   By: Lowella Grip III M.D.   On: 07/31/2018 07:13   Mr Brain Wo Contrast  Result Date: 07/31/2018 CLINICAL DATA:  Vertigo persistent, central EXAM: MRI HEAD WITHOUT CONTRAST TECHNIQUE: Multiplanar, multiecho pulse sequences of the brain and surrounding structures were obtained without intravenous contrast. COMPARISON:  CT head 07/31/2018 FINDINGS: Incomplete exam. The patient refused to complete the exam. Sagittal T1 and axial and coronal diffusion sequences performed Negative for acute infarct. Ventricle size normal. Chronic infarcts in the pons bilaterally and in the white matter bilaterally. IMPRESSION: Incomplete exam.  Negative for acute infarct. Electronically Signed   By: Franchot Gallo M.D.   On: 07/31/2018 11:27    EKG: Independently reviewed.  Normal sinus rhythm with nonspecific lateral T wave abnormalities with LVH pattern.  QTc at 479 ms    Assessment and Plan:   1.  Vertigo: BPPV versus posterior stroke.  Unlikely to be Mnire's or vestibular neuritis in the absence of nausea, vomiting, headache or hearing loss.  Incomplete MRI exam as patient could not lay flat.  CT head shows multiple lacunar infarcts.  Will add baby aspirin to Coumadin for now.  Add statins.  Will request physical therapy evaluation for Dix-Hallpike maneuver.  If symptoms persist may need repeat MRI after dialysis when he possibly can lay flat.  Continue neurochecks and meclizine.  2.  Chronic hypotension: Resume Midodrin.  Patient's presenting symptoms do not appear to be typical orthostatic symptoms as he is describing spinning sensation.  Currently systolic blood pressure in the 100s while his baseline is around systolic 88C.  Hold nitrates for now.  Support stockings.  3.  End-stage renal disease: No significant leg edema and saturating well on room air.  Resume hemodialysis if  blood pressure tolerates, nephrology has been consulted.  Repeat potassium level at 5.1.  4.  Diabetes mellitus: Not on medications at home.  Resume diabetic diet, sliding scale insulin.  5.  Renal cell carcinoma/renal cyst: Status post right nephrectomy with chronic kidney disease/dialysis dependent.  6.  Anal fistula: Reports mild symptoms/leakage of fecal material when having bowel movements.  Following general surgery as outpatient and scheduled for repair on March 24 with instructions to hold anticoagulants 5 days prior.  7.  Paroxysmal atrial flutter: Currently in sinus rhythm.  Continue to monitor.  Resume home medications including anticoagulants.  8.  History of chronic systolic CHF: Last echo showed EF of 30 to 35%.  Resume home medications except nitrates..  9.  History of PE: Status post IVC filter.  On chronic anticoagulation.  10.  History of gout: Stable.  Resume home medications.  DVT prophylaxis: On chronic anticoagulation, INR 1.9  Code Status: Full code  Family Communication: Discussed with patient and wife bedside. Health care proxy would be his wife Consults called: Nephrology Admission status:  Patient admitted as observation as anticipated LOS less than 2 midnights    Guilford Shi MD [facility] Pager [phone]  If 7PM-7AM, please contact night-coverage [email or web address] Password [facility]  07/31/2018, 3:31 PM

## 2018-07-31 NOTE — ED Provider Notes (Signed)
[facility] EMERGENCY DEPARTMENT Provider Note   CSN: [id number] Arrival date & time: 07/31/18  0605    History   Chief Complaint Chief Complaint  Patient presents with  . Shortness of Breath    HPI [name] is a 58 y.o. male with a past medical history of CHF, Cardiomyopathy, CAD, DM, ESRD on HD MWF who went to dialysis this morning and was sent here for shortness of breath and hypotension.  He reports compliance with his medications and dialysis.  Has not missed any dialysis sessions.  He reports that since yesterday he has felt short of breath.  He reports that since yesterday he has been feeling dizzy and "like I just got off a merry-go-round."  He says that his symptoms worsen when he turns his head from side to side, however do not worsen when he sits up or lays down.  He denies any hearing changes.  He denies any recent trauma or striking his head.  He has been taking Augmentin for a peri-anal fistula.  He does not make any urine. He denies any headache, chest or abdominal pain.     HPI  Past Medical History:  Diagnosis Date  . Atrial flutter ([place])   . 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 Cardiomyopathy   . CHF (congestive heart failure) ([place])   . Coronary artery disease    normal coronaries by 10/10/08 cath, Cardiac Cath 08-04-12 epic.Dr. Doylene Canard follows  . Diabetes mellitus    pt. states he's borderline diabetic., no longer taking med,- off med. since 2013  . Dialysis patient [facility])    Mon-Wed-Fri([place])- Left AV fistula  . Diverticulitis November 2016   reoccurred in December 2016  . ESRD (end stage renal disease) ([place])   . GERD (gastroesophageal reflux disease)   . Gout   . History of nephrectomy 07/04/2012   History of right nephrectomy in 2002 for renal cell carcinoma   . History of unilateral nephrectomy   . Hypertension   . Low iron   . Myocardial infarction [facility]) ?2006  . Renal cell carcinoma    dialysis- MWF- Dr. Mercy Moore follows.  . Renal insufficiency   . Shortness of breath 05/19/11   "at rest, lying down, w/exertion"  . Stroke [facility]) 02/2011   05/19/11 denies residual  . Umbilical hernia 29/92/42   unrepaired    Patient Active Problem List   Diagnosis Date Noted  . Acute lower GI bleeding 06/21/2018  . Supratherapeutic INR 06/20/2018  . Melena 06/20/2018  . Malnutrition of moderate degree 02/10/2018  . Anemia 01/27/2018  . HCAP (healthcare-associated pneumonia) 01/26/2018  . CAD (coronary artery disease) 01/26/2018  . Atrial fibrillation, chronic 01/26/2018  . Depression 01/26/2018  . GERD (gastroesophageal reflux disease) 01/26/2018  . Pulmonary embolism ([place]) 01/26/2018  . History of atrial flutter 01/13/2018  . Hemorrhage of left kidney 01/13/2018  . Acute respiratory failure with hypoxemia ([place]) 07/17/2017  . 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  . Acute on chronic combined systolic and diastolic CHF, NYHA class 4 ([place]) 07/17/2017  . Unresponsiveness 03/16/2017  . Hypotension 03/16/2017  . Paroxysmal atrial flutter ([place]) 03/16/2017  . Chronic pain 03/16/2017  . Leg pain, bilateral 03/16/2017  . Bile leak, postoperative 03/15/2016  . Elevated troponin 03/15/2016  . Rib pain on right side   . Biliary dyskinesia 02/12/2016  . Atrial flutter with rapid ventricular response ([place]) 06/27/2015  . Diverticulitis 04/21/2015  . Acute diverticulitis  04/21/2015  . Diverticulitis of intestine without perforation or abscess without bleeding 04/21/2015  . Pulmonary edema 01/13/2015  . Right upper quadrant abdominal pain 01/13/2015  . Acute respiratory failure with hypoxia ([place]) 01/13/2015  . Diverticulitis large intestine w/o perforation or abscess w/o bleeding 12/18/2014  . Hepatic cyst 12/18/2014  . Aftercare following surgery of the circulatory system, [place] 06/22/2013  . ESRD (end stage renal disease) on dialysis ([place]) 11/28/2012  . Right sided weakness 10/05/2012  .  History of nephrectomy 07/04/2012  . End-stage renal disease on hemodialysis ([place]) 06/04/2011  . Chronic combined systolic (congestive) and diastolic (congestive) heart failure ([place]) 05/19/2011    Class: Acute  . Hypertension, benign 05/19/2011    Class: Chronic  . DM II (diabetes mellitus, type II), controlled ([place]) 05/19/2011    Class: Chronic  . Gout, chronic 05/19/2011    Past Surgical History:  Procedure Laterality Date  . AV FISTULA PLACEMENT  03/29/2011   Procedure: ARTERIOVENOUS (AV) FISTULA CREATION;  Surgeon: Hinda Lenis, MD;  Location: [place];  Service: Vascular;  Laterality: Left;  LEFT RADIOCEPHALIC , Arteriovenous [id number])  . CARDIAC CATHETERIZATION  05/21/11  . CARDIAC CATHETERIZATION N/A 03/16/2016   Procedure: Left Heart Cath and Coronary Angiography;  Surgeon: Dixie Dials, MD;  Location: [place] CV LAB;  Service: Cardiovascular;  Laterality: N/A;  . CHOLECYSTECTOMY N/A 02/12/2016   Procedure: LAPAROSCOPIC CHOLECYSTECTOMY WITH INTRAOPERATIVE CHOLANGIOGRAM;  Surgeon: Jackolyn Confer, MD;  Location: [place];  Service: General;  Laterality: N/A;  . COLONOSCOPY WITH PROPOFOL N/A 02/01/2017   Procedure: COLONOSCOPY WITH PROPOFOL;  Surgeon: Juanita Craver, MD;  Location: [facility];  Service: Endoscopy;  Laterality: N/A;  . dialysis cath placed    . ESOPHAGOGASTRODUODENOSCOPY (EGD) WITH PROPOFOL N/A 12/02/2015   Procedure: ESOPHAGOGASTRODUODENOSCOPY (EGD) WITH PROPOFOL;  Surgeon: Juanita Craver, MD;  Location: [facility];  Service: Endoscopy;  Laterality: N/A;  . FINGER SURGERY     L pinkie finger- ORIF- /w remaining hardware - 1990's    . HERNIA REPAIR N/A 9/56/2130   Umbilical hernia repair  . INSERTION OF DIALYSIS CATHETER N/A 01/27/2016   Procedure: INSERTION OF DIALYSIS CATHETER;  Surgeon: Waynetta Sandy, MD;  Location: [place];  Service: Vascular;  Laterality: N/A;  . INSERTION OF MESH N/A 07/04/2012   Procedure: INSERTION OF MESH;  Surgeon: Madilyn Hook, DO;  Location: [place];  Service: General;  Laterality: N/A;  . IR EMBO ART  VEN HEMORR LYMPH EXTRAV  INC GUIDE ROADMAPPING  01/13/2018  . IR FLUORO GUIDE CV LINE RIGHT  01/13/2018  . IR IVC FILTER PLMT / S&I /IMG GUID/MOD SED  01/27/2018  . IR RENAL SELECTIVE  UNI INC S&I MOD SED  01/13/2018  . IR US GUIDE VASC ACCESS RIGHT  01/13/2018  . LAPAROSCOPIC LYSIS OF ADHESIONS N/A 02/12/2016   Procedure: LAPAROSCOPIC LYSIS OF ADHESIONS;  Surgeon: Jackolyn Confer, MD;  Location: [place];  Service: General;  Laterality: N/A;  . LEFT AND RIGHT HEART CATHETERIZATION WITH CORONARY ANGIOGRAM N/A 08/04/2012   Procedure: LEFT AND RIGHT HEART CATHETERIZATION WITH CORONARY ANGIOGRAM;  Surgeon: Birdie Riddle, MD;  Location: [place] CATH LAB;  Service: Cardiovascular;  Laterality: N/A;  . NEPHRECTOMY  2000   right  . REVISON OF ARTERIOVENOUS FISTULA Left 06/05/2013   Procedure: REVISON OF LEFT RADIOCEPHALIC  ARTERIOVENOUS FISTULA;  Surgeon: Conrad Holland, MD;  Location: [facility];  Service: Vascular;  Laterality: Left;  . REVISON OF ARTERIOVENOUS FISTULA Left 01/27/2016   Procedure: REVISION OF LEFT  UPPER EXTREMITY ARTERIOVENOUS FISTULA;  Surgeon: Waynetta Sandy, MD;  Location: [place];  Service: Vascular;  Laterality: Left;  . REVISON OF ARTERIOVENOUS FISTULA Left 08/03/2016   Procedure: REVISON OF Left arm ARTERIOVENOUS FISTULA;  Surgeon: Elam Dutch, MD;  Location: [place];  Service: Vascular;  Laterality: Left;  . REVISON OF ARTERIOVENOUS FISTULA Left 05/06/2017   Procedure: REVISION PLICATION OF ARTERIOVENOUS FISTULA LEFT ARM;  Surgeon: Rosetta Posner, MD;  Location: [place];  Service: Vascular;  Laterality: Left;  . RIGHT HEART CATHETERIZATION N/A 05/21/2011   Procedure: RIGHT HEART CATH;  Surgeon: Birdie Riddle, MD;  Location: [facility] CATH LAB;  Service: Cardiovascular;  Laterality: N/A;  . smashed  1990's   "left pinky; have a plate in there"  . UMBILICAL HERNIA REPAIR N/A 07/04/2012   Procedure: LAPAROSCOPIC  UMBILICAL HERNIA;  Surgeon: Madilyn Hook, DO;  Location: [place];  Service: General;  Laterality: N/A;  . US ECHOCARDIOGRAPHY  05/20/11        Home Medications    Prior to Admission medications   Medication Sig Start Date End Date Taking? Authorizing Provider  allopurinol (ZYLOPRIM) 300 MG tablet Take 150 mg by mouth every other day.   Yes [provider]  Amino Acids-Protein Hydrolys (FEEDING SUPPLEMENT, PRO-STAT SUGAR FREE 64,) LIQD Take 30 mLs by mouth 2 (two) times daily. 02/11/18  Yes Oretha Milch D, MD  amiodarone (PACERONE) 200 MG tablet Take 0.5 tablets (100 mg total) by mouth daily. 07/21/17  Yes Regalado, Belkys A, MD  amoxicillin (AMOXIL) 875 MG tablet Take 875 mg by mouth daily. 07/30/18  Yes [provider]  calcium acetate (PHOSLO) 667 MG capsule Take 667-2,001 mg by mouth See admin instructions. Take 2001 mg  capsules with meals and 800 mg capsule with snacks. 01/24/18  Yes [provider]  chlorhexidine (PERIDEX) 0.12 % solution 15 mLs by Mouth Rinse route daily as needed. Mouth rinse 05/29/18  Yes [provider]  doxercalciferol (HECTOROL) 4 MCG/2ML injection Inject 1.5 mLs (3 mcg total) into the vein every Monday, Wednesday, and Friday with hemodialysis. 06/23/18  Yes Dixie Dials, MD  ferric citrate (AURYXIA) 1 GM 210 MG(Fe) tablet Take 210 mg by mouth 3 (three) times daily with meals.    Yes [provider]  isosorbide dinitrate (ISORDIL) 20 MG tablet Take 20 mg by mouth 2 (two) times daily.   Yes [provider]  lidocaine-prilocaine (EMLA) cream Apply 1 application topically See admin instructions. Apply topically to port access prior to dialysis - Monday, Wednesday, Friday 04/08/15  Yes [provider]  midodrine (PROAMATINE) 10 MG tablet 30 mg  on the days of dyalisis three times a week, and only takes 10 mg on off dyalisis days. Patient taking differently: Take 10-15 mg by mouth See admin instructions. 15 mg  on  the days of dyalisis three times a week, and only takes 10 mg on off dyalisis days. 07/18/18  Yes Rodriguez-Southworth, Sunday Spillers, PA-C  mirtazapine (REMERON) 7.5 MG tablet Take 1 tablet (7.5 mg total) by mouth at bedtime. 01/23/18  Yes Emokpae, Courage, MD  multivitamin (RENA-VIT) TABS tablet Take 1 tablet by mouth at bedtime.   Yes [provider]  nitroGLYCERIN (NITROSTAT) 0.4 MG SL tablet Place 0.4 mg under the tongue every 5 (five) minutes as needed for chest pain.  05/20/15  Yes [provider]  pantoprazole (PROTONIX) 40 MG tablet Take 40 mg by mouth 2 (two) times daily.   Yes [provider]  warfarin (COUMADIN) 10 MG tablet Take 1 tablet (10 mg total) by mouth daily at 6 PM. 06/22/18  Yes Dixie Dials, MD  hydrocortisone (ANUSOL-HC) 25 MG suppository Place 1 suppository (25 mg total) rectally every 12 (twelve) hours. Patient not taking: Reported on 07/31/2018 07/05/18 07/05/19  Rodriguez-Southworth, Sunday Spillers, PA-C  Lidocaine, Anorectal, (L-M-X 5) 5 % CREA Apply 1 application topically 4 (four) times daily as needed (apply to area up to 6 times daily as needed). 07/09/18   Gareth Morgan, MD    Family History Family History  Problem Relation Age of Onset  . Hypertension Mother   . Kidney disease Mother     Social History Social History   Tobacco Use  . Smoking status: Never Smoker  . Smokeless tobacco: Never Used  Substance Use Topics  . Alcohol use: No    Alcohol/week: 0.0 standard drinks  . Drug use: No    Types: Cocaine    Comment: Past hx-none in 20 yrs     Allergies   Ace inhibitors   Review of Systems Review of Systems  Constitutional: Positive for fatigue. Negative for chills and fever.  HENT: Negative for congestion.   Eyes:       Baseline vision, limited to light/dark and colors/shapes in right eye  Respiratory: Positive for shortness of breath. Negative for cough and chest tightness.   Cardiovascular: Negative for chest pain, palpitations  and leg swelling.  Gastrointestinal: Negative for abdominal pain and rectal pain.  Genitourinary: Negative for dysuria.  Musculoskeletal: Negative for back pain and neck pain.  Neurological: Positive for dizziness. Negative for syncope, speech difficulty and weakness.  Psychiatric/Behavioral: Negative for confusion. The patient is not nervous/anxious.   All other systems reviewed and are negative.    Physical Exam Updated Vital Signs BP 102/72   Pulse 82   Temp 97.8 F (36.6 C) (Oral)   Resp (!) 21   SpO2 100%   Physical Exam Vitals signs and nursing note reviewed.  Constitutional:      General: He is not in acute distress.    Appearance: He is well-developed.  HENT:     Head: Normocephalic and atraumatic.     Mouth/Throat:     Mouth: Mucous membranes are moist.  Eyes:     Conjunctiva/sclera: Conjunctivae normal.     Comments: Patient with full EOM.  Left eye round and reactive to light with out nystagmus.    Neck:     Musculoskeletal: Normal range of motion and neck supple.  Cardiovascular:     Rate and Rhythm: Normal rate and regular rhythm.     Pulses:          Radial pulses are 2+ on the right side and 2+ on the left side.       Dorsalis pedis pulses are 2+ on the right side and 2+ on the left side.     Heart sounds: No murmur.     Arteriovenous access: left arteriovenous access is present.    Comments: Palpable thrill in left arm fistula.  Pulmonary:     Effort: Pulmonary effort is normal. No accessory muscle usage or respiratory distress.     Breath sounds: Normal breath sounds. No decreased breath sounds, wheezing, rhonchi or rales.  Chest:     Chest wall: No mass, deformity or tenderness. There is no dullness to percussion.  Abdominal:     Palpations: Abdomen is soft. There is no mass.     Tenderness: There is no abdominal tenderness.  Musculoskeletal:     Left lower leg: No edema.  Skin:    General: Skin is warm and dry.  Neurological:     General: No  focal deficit present.     Mental Status: He is alert and oriented to person, place, and time.     Comments: Smile and facial movents are symmetrical.  Normal finger/nose/finger bilaterally.  5/5 strength in bilateral upper and lower extremities.   Psychiatric:        Mood and Affect: Mood normal.        Behavior: Behavior normal.      ED Treatments / Results  Labs (all labs ordered are listed, but only abnormal results are displayed) Labs Reviewed  BASIC METABOLIC PANEL - Abnormal; Notable for the following components:      Result Value   Sodium 133 (*)    Potassium 5.7 (*)    Chloride 95 (*)    BUN 54 (*)    Creatinine, Ser 12.77 (*)    GFR calc non Af Amer 4 (*)    GFR calc Af Amer 4 (*)    Anion gap 16 (*)    All other components within normal limits  CBC - Abnormal; Notable for the following components:   RBC 3.38 (*)    Hemoglobin 10.6 (*)    HCT 33.8 (*)    RDW 17.1 (*)    All other components within normal limits  PROTIME-INR - Abnormal; Notable for the following components:   Prothrombin Time 21.7 (*)    INR 1.9 (*)    All other components within normal limits  POTASSIUM  CBG MONITORING, ED    EKG EKG Interpretation  Date/Time:  Monday July 31 2018 06:10:59 EDT Ventricular Rate:  88 PR Interval:  172 QRS Duration: 114 QT Interval:  396 QTC Calculation: 479 R Axis:   -26 Text Interpretation:  Normal sinus rhythm Moderate voltage criteria for LVH, may be normal variant T wave abnormality, consider lateral ischemia Prolonged QT Abnormal ECG No significant change since last tracing Confirmed by Orpah Greek [phone]) on 07/31/2018 6:17:14 AM Also confirmed by Orpah Greek [phone]), editor Philomena Doheny [phone])  on 07/31/2018 8:08:57 AM   Radiology Dg Chest 2 View  Result Date: 07/31/2018 CLINICAL DATA:  Shortness of breath and dizziness EXAM: CHEST - 2 VIEW COMPARISON:  01/27/2018 FINDINGS: Low volume chest with interstitial crowding. There is  no edema, consolidation, effusion, or pneumothorax. Chronic cardiomegaly. IMPRESSION: 1. No evidence of acute disease. 2. Cardiomegaly. Electronically Signed   By: Monte Fantasia M.D.   On: 07/31/2018 06:48   Ct Head Wo Contrast  Result Date: 07/31/2018 CLINICAL DATA:  Episodic vertigo.  Renal failure EXAM: CT HEAD WITHOUT CONTRAST TECHNIQUE: Contiguous axial images were obtained from the base of the skull through the vertex without intravenous contrast. COMPARISON:  February 01, 2018 FINDINGS: Brain: The ventricles and sulci are within normal limits for age. There is no intracranial mass, hemorrhage, extra-axial fluid collection, or midline shift. There is mild small vessel disease in the centra semiovale bilaterally. There are occasional nonacute appearing lacunar type infarcts within each centrum semiovale. There is evidence of a prior infarct in the medial right upper pons, stable. No acute infarct is demonstrable on this study. Vascular: No hyperdense vessel. There is calcification in each carotid siphon and distal left vertebral artery. The basilar artery is somewhat dolichoectatic, stable. Skull: Bony calvarium appears intact. Sinuses/Orbits: There is mucosal thickening in several ethmoid air cells. Other visualized  paranasal sinuses are clear. Orbits appear symmetric bilaterally. Other: There is mucosal thickening in several medial mastoid air cells bilaterally. The middle and inner ear regions appear symmetric bilaterally. IMPRESSION: Scattered supratentorial and infratentorial lacunar type infarcts. No acute infarct evident. There is mild periventricular small vessel disease. No mass or hemorrhage. Multiple foci of arterial vascular calcification noted. There is mucosal thickening in several ethmoid air cells. There is mucosal thickening in several medial mastoid air cells bilaterally. Electronically Signed   By: Lowella Grip III M.D.   On: 07/31/2018 07:13   Mr Brain Wo Contrast  Result  Date: 07/31/2018 CLINICAL DATA:  Vertigo persistent, central EXAM: MRI HEAD WITHOUT CONTRAST TECHNIQUE: Multiplanar, multiecho pulse sequences of the brain and surrounding structures were obtained without intravenous contrast. COMPARISON:  CT head 07/31/2018 FINDINGS: Incomplete exam. The patient refused to complete the exam. Sagittal T1 and axial and coronal diffusion sequences performed Negative for acute infarct. Ventricle size normal. Chronic infarcts in the pons bilaterally and in the white matter bilaterally. IMPRESSION: Incomplete exam.  Negative for acute infarct. Electronically Signed   By: Franchot Gallo M.D.   On: 07/31/2018 11:27    Procedures Procedures (including critical care time)  Medications Ordered in ED Medications  meclizine (ANTIVERT) tablet 25 mg (25 mg Oral Given 07/31/18 0835)  sodium chloride 0.9 % bolus 250 mL (250 mLs Intravenous New Bag/Given 07/31/18 1206)  amoxicillin (AMOXIL) capsule 500 mg (500 mg Oral Given 07/31/18 1207)     Initial Impression / Assessment and Plan / ED Course  I have reviewed the triage vital signs and the nursing notes.  Pertinent labs & imaging results that were available during my care of the patient were reviewed by me and considered in my medical decision making (see chart for details).  Clinical Course as of Jul 30 1413  Mon Jul 31, 2018  0827 Called MRI about implanted devices, was told that once order is placed they will look through his chart and screen him for compatibility.  If able to have MRI will send for him, if not then they will call me back.    [EH]  1210 Asked case management to touch base with dialysis clinic to make sure he can get seen.    [EH]  1244 Attempted to ambulate patient, when sat him up he felt too dizzy to try and stand.    [EH]  1346 Spoke with hospitalist who will come see patient.    [EH]    Clinical Course User Index [EH] Lorin Glass, PA-C      Patient presents today for evaluation of  generally not feeling well including dizziness and shortness of breath.  He reports that he went to dialysis this morning when they thought that he was hypotension and sent him here.  Here his blood pressure is consistent with his baseline in the high 90s to low 170 systolic.  He is not in respiratory distress, does not have increased work of breathing.  He says that his primary concern is that he feels dizzy.  He describes this as "like a merry-go-round is spinning.  He denies any recent trauma.  Labs are obtained and reviewed, appear mostly consistent with his baseline.  Potassium is not significantly elevated on repeat draw, national was slightly high due to hemolysis.  Chest x-ray without evidence of marked interstitial edema, pneumonia, or other cause for his subjective shortness of breath.  CT head was obtained without acute abnormalities.  Based on his history, brain  MRI was ordered, patient was unable to tolerate the full scan, however with available scan no evidence of acute ischemia.  He was given meclizine, 25 mg, after which he initially said that he felt slightly better.  He was given 250 mL fluid bolus and allowed to eat and drink.  After this attempted to ambulate patient.  When I sent him on the edge of the bed he became too dizzy to stay sitting on the edge of the bed and had to lay back down.  Given this I do not feel that he is safe for discharge home as he cannot safely move around.  Hospitalist was consulted who agreed to come see patient.  This patient was seen as a shared visit with Dr. Maryan Rued.   Final Clinical Impressions(s) / ED Diagnoses   Final diagnoses:  Dizziness    ED Discharge Orders    None       Ollen Gross 07/31/18 1509    Blanchie Dessert, MD 08/01/18 [phone]

## 2018-07-31 NOTE — ED Notes (Signed)
ED TO INPATIENT HANDOFF REPORT  ED Nurse Name and Phone #: Joellen Jersey [phone]  S Name/Age/Gender [name] 58 y.o. male Room/Bed: 023C/023C  Code Status   Code Status: Full Code  Home/SNF/Other Home Patient oriented to: self, place, time and situation Is this baseline? Yes   Triage Complete: Triage complete  Chief Complaint SOB   Triage Note Pt states that for the past several days he has been having dizziness, room spinning worse this AM, feeling SOB, dialysis pt.    Allergies Allergies  Allergen Reactions  . Ace Inhibitors Itching and Cough    Level of Care/Admitting Diagnosis ED Disposition    ED Disposition Condition Comment   Admit  Hospital Area: [place] [[id number]]  Level of Care: Medical Telemetry [104]  I expect the patient will be discharged within 24 hours: No (not a candidate for 5C-Observation unit)  Diagnosis: Vertigo [308657]  Admitting Physician: Guilford Shi [[id number]]  Attending Physician: Guilford Shi [[id number]]  PT Class (Do Not Modify): Observation [104]  PT Acc Code (Do Not Modify): Observation [10022]       B Medical/Surgery History Past Medical History:  Diagnosis Date  . Atrial flutter ([place])   . 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 Cardiomyopathy   . CHF (congestive heart failure) ([place])   . Coronary artery disease    normal coronaries by 10/10/08 cath, Cardiac Cath 08-04-12 epic.Dr. Doylene Canard follows  . Diabetes mellitus    pt. states he's borderline diabetic., no longer taking med,- off med. since 2013  . Dialysis patient [facility])    Mon-Wed-Fri([place])- Left AV fistula  . Diverticulitis November 2016   reoccurred in December 2016  . ESRD (end stage renal disease) ([place])   . GERD (gastroesophageal reflux disease)   . Gout   . History of nephrectomy 07/04/2012   History of right nephrectomy in 2002 for renal cell carcinoma   . History of unilateral nephrectomy   .  Hypertension   . Low iron   . Myocardial infarction [facility]) ?2006  . Renal cell carcinoma    dialysis- MWF- Dr. Mercy Moore follows.  . Renal insufficiency   . Shortness of breath 05/19/11   "at rest, lying down, w/exertion"  . Stroke [facility]) 02/2011   05/19/11 denies residual  . Umbilical hernia 44/01/02   unrepaired   Past Surgical History:  Procedure Laterality Date  . AV FISTULA PLACEMENT  03/29/2011   Procedure: ARTERIOVENOUS (AV) FISTULA CREATION;  Surgeon: Hinda Lenis, MD;  Location: [place];  Service: Vascular;  Laterality: Left;  LEFT RADIOCEPHALIC , Arteriovenous VOZDGUY([id number])  . CARDIAC CATHETERIZATION  05/21/11  . CARDIAC CATHETERIZATION N/A 03/16/2016   Procedure: Left Heart Cath and Coronary Angiography;  Surgeon: Dixie Dials, MD;  Location: [place] CV [facility];  Service: Cardiovascular;  Laterality: N/A;  . CHOLECYSTECTOMY N/A 02/12/2016   Procedure: LAPAROSCOPIC CHOLECYSTECTOMY WITH INTRAOPERATIVE CHOLANGIOGRAM;  Surgeon: Jackolyn Confer, MD;  Location: [place];  Service: General;  Laterality: N/A;  . COLONOSCOPY WITH PROPOFOL N/A 02/01/2017   Procedure: COLONOSCOPY WITH PROPOFOL;  Surgeon: Juanita Craver, MD;  Location: [facility];  Service: Endoscopy;  Laterality: N/A;  . dialysis cath placed    . ESOPHAGOGASTRODUODENOSCOPY (EGD) WITH PROPOFOL N/A 12/02/2015   Procedure: ESOPHAGOGASTRODUODENOSCOPY (EGD) WITH PROPOFOL;  Surgeon: Juanita Craver, MD;  [facility]: [facility];  Service: Endoscopy;  Laterality: N/A;  . FINGER SURGERY     L pinkie finger- ORIF- /w remaining hardware - 1990's    .  HERNIA REPAIR N/A 11/17/9483   Umbilical hernia repair  . INSERTION OF DIALYSIS CATHETER N/A 01/27/2016   Procedure: INSERTION OF DIALYSIS CATHETER;  Surgeon: Waynetta Sandy, MD;  Location: [place];  Service: Vascular;  Laterality: N/A;  . INSERTION OF MESH N/A 07/04/2012   Procedure: INSERTION OF MESH;  Surgeon: Madilyn Hook, DO;  Location: [place];  Service: General;  Laterality:  N/A;  . IR EMBO ART  VEN HEMORR LYMPH EXTRAV  INC GUIDE ROADMAPPING  01/13/2018  . IR FLUORO GUIDE CV LINE RIGHT  01/13/2018  . IR IVC FILTER PLMT / S&I /IMG GUID/MOD SED  01/27/2018  . IR RENAL SELECTIVE  UNI INC S&I MOD SED  01/13/2018  . IR US GUIDE VASC ACCESS RIGHT  01/13/2018  . LAPAROSCOPIC LYSIS OF ADHESIONS N/A 02/12/2016   Procedure: LAPAROSCOPIC LYSIS OF ADHESIONS;  Surgeon: Jackolyn Confer, MD;  Location: [place];  Service: General;  Laterality: N/A;  . LEFT AND RIGHT HEART CATHETERIZATION WITH CORONARY ANGIOGRAM N/A 08/04/2012   Procedure: LEFT AND RIGHT HEART CATHETERIZATION WITH CORONARY ANGIOGRAM;  Surgeon: Birdie Riddle, MD;  Location: [place] CATH LAB;  Service: Cardiovascular;  Laterality: N/A;  . NEPHRECTOMY  2000   right  . REVISON OF ARTERIOVENOUS FISTULA Left 06/05/2013   Procedure: REVISON OF LEFT RADIOCEPHALIC  ARTERIOVENOUS FISTULA;  Surgeon: Conrad Nett Lake, MD;  Location: [place];  Service: Vascular;  Laterality: Left;  . REVISON OF ARTERIOVENOUS FISTULA Left 01/27/2016   Procedure: REVISION OF LEFT UPPER EXTREMITY ARTERIOVENOUS FISTULA;  Surgeon: Waynetta Sandy, MD;  Location: [place];  Service: Vascular;  Laterality: Left;  . REVISON OF ARTERIOVENOUS FISTULA Left 08/03/2016   Procedure: REVISON OF Left arm ARTERIOVENOUS FISTULA;  Surgeon: Elam Dutch, MD;  Location: [place];  Service: Vascular;  Laterality: Left;  . REVISON OF ARTERIOVENOUS FISTULA Left 05/06/2017   Procedure: REVISION PLICATION OF ARTERIOVENOUS FISTULA LEFT ARM;  Surgeon: Rosetta Posner, MD;  Location: [place];  Service: Vascular;  Laterality: Left;  . RIGHT HEART CATHETERIZATION N/A 05/21/2011   Procedure: RIGHT HEART CATH;  Surgeon: Birdie Riddle, MD;  Location: [facility] CATH LAB;  Service: Cardiovascular;  Laterality: N/A;  . smashed  1990's   "left pinky; have a plate in there"  . UMBILICAL HERNIA REPAIR N/A 07/04/2012   Procedure: LAPAROSCOPIC UMBILICAL HERNIA;  Surgeon: Madilyn Hook, DO;  Location: [place];   Service: General;  Laterality: N/A;  . US ECHOCARDIOGRAPHY  05/20/11     A IV Location/Drains/Wounds Patient Lines/Drains/Airways Status   Active Line/Drains/Airways    Name:   Placement date:   Placement time:   Site:   Days:   Peripheral IV 07/31/18 Right Antecubital   07/31/18    1205    Antecubital   less than 1   Fistula / Graft Left Arteriovenous fistula   -    -    -      Fistula / Graft Left Forearm Arteriovenous fistula   -    -    Forearm      Fistula / Graft Left Forearm Arteriovenous fistula   01/27/16    0820    Forearm   916   Fistula / Graft Left Forearm Arteriovenous fistula   08/03/16    0936    Forearm   727   Fistula / Graft Left Forearm Arteriovenous fistula   01/13/18    2057    Forearm   199   Incision (Closed) 02/12/16 Abdomen  02/12/16    1728     900   Incision (Closed) 08/03/16 Arm Left   08/03/16    0936     727   Incision (Closed) 05/06/17 Arm Left   05/06/17    1659     451   Incision - 5 Ports Abdomen Right;Lateral;Upper Right;Lateral;Lower Medial;Upper Umbilicus Left;Lateral   02/12/16    1729     900   Wound / Incision (Open or Dehisced) 06/27/15 Other (Comment) Arm Left;Lower AVF for HD. Dressing intact.    06/27/15    1530    Arm   1130          Intake/Output Last 24 hours  Intake/Output Summary (Last 24 hours) at 07/31/2018 1744 Last data filed at 07/31/2018 1300 Gross per 24 hour  Intake 250 ml  Output -  Net 250 ml    Labs/Imaging Results for orders placed or performed during the hospital encounter of 07/31/18 (from the past 48 hour(s))  Basic metabolic panel     Status: Abnormal   Collection Time: 07/31/18  7:05 AM  Result Value Ref Range   Sodium 133 (L) 135 - 145 mmol/L   Potassium 5.7 (H) 3.5 - 5.1 mmol/L    Comment: SLIGHT HEMOLYSIS   Chloride 95 (L) 98 - 111 mmol/L   CO2 22 22 - 32 mmol/L   Glucose, Bld 86 70 - 99 mg/dL   BUN 54 (H) 6 - 20 mg/dL   Creatinine, Ser 12.77 (H) 0.61 - 1.24 mg/dL   Calcium 9.0 8.9 - 10.3 mg/dL   GFR  calc non Af Amer 4 (L) >60 mL/min   GFR calc Af Amer 4 (L) >60 mL/min   Anion gap 16 (H) 5 - 15    Comment: Performed at [facility], [address], [place], [place] [address]  CBC     Status: Abnormal   Collection Time: 07/31/18  7:05 AM  Result Value Ref Range   WBC 5.7 4.0 - 10.5 K/uL   RBC 3.38 (L) 4.22 - 5.81 MIL/uL   Hemoglobin 10.6 (L) 13.0 - 17.0 g/dL   HCT 33.8 (L) 39.0 - 52.0 %   MCV 100.0 80.0 - 100.0 fL   MCH 31.4 26.0 - 34.0 pg   MCHC 31.4 30.0 - 36.0 g/dL   RDW 17.1 (H) 11.5 - 15.5 %   Platelets 180 150 - 400 K/uL   nRBC 0.0 0.0 - 0.2 %    Comment: Performed at [facility], [place] [address], [place], [place] [address]  Protime-INR     Status: Abnormal   Collection Time: 07/31/18  7:05 AM  Result Value Ref Range   Prothrombin Time 21.7 (H) 11.4 - 15.2 seconds   INR 1.9 (H) 0.8 - 1.2    Comment: (NOTE) INR goal varies based on device and disease states. Performed at [facility], [place] [address], [place], [place] [address]   Potassium     Status: None   Collection Time: 07/31/18  8:48 AM  Result Value Ref Range   Potassium 5.1 3.5 - 5.1 mmol/L    Comment: Performed at [facility] [address], [place], [place] [address]   Dg Chest 2 View  Result Date: 07/31/2018 CLINICAL DATA:  Shortness of breath and dizziness EXAM: CHEST - 2 VIEW COMPARISON:  01/27/2018 FINDINGS: Low volume chest with interstitial crowding. There is no edema, consolidation, effusion, or pneumothorax. Chronic cardiomegaly. IMPRESSION: 1. No evidence of  acute disease. 2. Cardiomegaly. Electronically Signed   By: Monte Fantasia M.D.   On: 07/31/2018 06:48   Ct Head Wo Contrast  Result Date: 07/31/2018 CLINICAL DATA:  Episodic vertigo.  Renal failure EXAM: CT HEAD WITHOUT CONTRAST TECHNIQUE: Contiguous axial images were obtained from the base of the skull through the vertex without intravenous contrast. COMPARISON:  February 01, 2018 FINDINGS: Brain: The ventricles and  sulci are within normal limits for age. There is no intracranial mass, hemorrhage, extra-axial fluid collection, or midline shift. There is mild small vessel disease in the centra semiovale bilaterally. There are occasional nonacute appearing lacunar type infarcts within each centrum semiovale. There is evidence of a prior infarct in the medial right upper pons, stable. No acute infarct is demonstrable on this study. Vascular: No hyperdense vessel. There is calcification in each carotid siphon and distal left vertebral artery. The basilar artery is somewhat dolichoectatic, stable. Skull: Bony calvarium appears intact. Sinuses/Orbits: There is mucosal thickening in several ethmoid air cells. Other visualized paranasal sinuses are clear. Orbits appear symmetric bilaterally. Other: There is mucosal thickening in several medial mastoid air cells bilaterally. The middle and inner ear regions appear symmetric bilaterally. IMPRESSION: Scattered supratentorial and infratentorial lacunar type infarcts. No acute infarct evident. There is mild periventricular small vessel disease. No mass or hemorrhage. Multiple foci of arterial vascular calcification noted. There is mucosal thickening in several ethmoid air cells. There is mucosal thickening in several medial mastoid air cells bilaterally. Electronically Signed   By: Lowella Grip III M.D.   On: 07/31/2018 07:13   Mr Brain Wo Contrast  Result Date: 07/31/2018 CLINICAL DATA:  Vertigo persistent, central EXAM: MRI HEAD WITHOUT CONTRAST TECHNIQUE: Multiplanar, multiecho pulse sequences of the brain and surrounding structures were obtained without intravenous contrast. COMPARISON:  CT head 07/31/2018 FINDINGS: Incomplete exam. The patient refused to complete the exam. Sagittal T1 and axial and coronal diffusion sequences performed Negative for acute infarct. Ventricle size normal. Chronic infarcts in the pons bilaterally and in the white matter bilaterally. IMPRESSION:  Incomplete exam.  Negative for acute infarct. Electronically Signed   By: Franchot Gallo M.D.   On: 07/31/2018 11:27    Pending Labs Unresulted Labs (From admission, onward)    Start     Ordered   08/01/18 4098  Basic metabolic panel  Tomorrow morning,   R     07/31/18 1529   08/01/18 0500  Protime-INR  Daily,   R     07/31/18 1634   07/31/18 1529  Hemoglobin A1c  Once,   R    Comments:  To assess prior glycemic control    07/31/18 1529   Signed and Held  Renal function panel  Once,   R     Signed and Held   Signed and Held  CBC  Once,   R     Signed and Held          Vitals/Pain Today's Vitals   07/31/18 1530 07/31/18 1600 07/31/18 1630 07/31/18 1700  BP: 102/68 110/78 (!) 104/52 102/78  Pulse: 84 84 83 82  Resp: 18 (!) 24 20 (!) 21  Temp:      TempSrc:      SpO2: 100% 100% 100% 99%  PainSc:        Isolation Precautions No active isolations  Medications Medications  allopurinol (ZYLOPRIM) tablet 150 mg (has no administration in time range)  amiodarone (PACERONE) tablet 100 mg (has no administration in time range)  nitroGLYCERIN (NITROSTAT) SL  tablet 0.4 mg (has no administration in time range)  mirtazapine (REMERON) tablet 7.5 mg (has no administration in time range)  ferric citrate (AURYXIA) tablet 210 mg (has no administration in time range)  pantoprazole (PROTONIX) EC tablet 40 mg (has no administration in time range)  feeding supplement (PRO-STAT SUGAR FREE 64) liquid 30 mL (has no administration in time range)  multivitamin (RENA-VIT) tablet 1 tablet (has no administration in time range)  Lidocaine (Anorectal) 5 % CREA 1 application (has no administration in time range)  acetaminophen (TYLENOL) tablet 650 mg (has no administration in time range)    Or  acetaminophen (TYLENOL) suppository 650 mg (has no administration in time range)  promethazine (PHENERGAN) tablet 12.5 mg (has no administration in time range)  aspirin EC tablet 81 mg (has no administration  in time range)  insulin aspart (novoLOG) injection 0-9 Units (has no administration in time range)  meclizine (ANTIVERT) tablet 25 mg (has no administration in time range)  atorvastatin (LIPITOR) tablet 40 mg (has no administration in time range)  Warfarin - Pharmacist Dosing Inpatient (has no administration in time range)  warfarin (COUMADIN) tablet 10 mg (has no administration in time range)  Chlorhexidine Gluconate Cloth 2 % PADS 6 each (has no administration in time range)  pentafluoroprop-tetrafluoroeth (GEBAUERS) aerosol 1 application (has no administration in time range)  lidocaine (PF) (XYLOCAINE) 1 % injection 5 mL (has no administration in time range)  lidocaine-prilocaine (EMLA) cream 1 application (has no administration in time range)  0.9 %  sodium chloride infusion (has no administration in time range)  0.9 %  sodium chloride infusion (has no administration in time range)  heparin injection 2,000 Units (has no administration in time range)  Darbepoetin Alfa (ARANESP) injection 60 mcg (has no administration in time range)  colchicine tablet 0.6 mg (has no administration in time range)  calcium acetate (PHOSLO) capsule 2,001 mg (has no administration in time range)  calcium acetate (PHOSLO) capsule 667 mg (has no administration in time range)  amoxicillin (AMOXIL) capsule 1,000 mg (has no administration in time range)  midodrine (PROAMATINE) tablet 15 mg (has no administration in time range)  midodrine (PROAMATINE) tablet 10 mg (has no administration in time range)  midodrine (PROAMATINE) tablet 15 mg (has no administration in time range)  meclizine (ANTIVERT) tablet 25 mg (25 mg Oral Given 07/31/18 0835)  sodium chloride 0.9 % bolus 250 mL (0 mLs Intravenous Stopped 07/31/18 1300)  amoxicillin (AMOXIL) capsule 500 mg (500 mg Oral Given 07/31/18 1207)    Mobility walks with person assist Low fall risk   Focused Assessments Renal Assessment Handoff:  Hemodialysis Schedule:  Hemodialysis Schedule: Monday/Wednesday/Friday Last Hemodialysis date and time: Friday   Restricted appendage: left arm     R Recommendations: See Admitting Provider Note  Report given to:   Additional Notes:

## 2018-08-01 ENCOUNTER — Ambulatory Visit: Payer: Self-pay | Admitting: Surgery

## 2018-08-01 DIAGNOSIS — H8112 Benign paroxysmal vertigo, left ear: Secondary | ICD-10-CM

## 2018-08-01 DIAGNOSIS — M1A9XX0 Chronic gout, unspecified, without tophus (tophi): Secondary | ICD-10-CM

## 2018-08-01 DIAGNOSIS — R42 Dizziness and giddiness: Secondary | ICD-10-CM | POA: Diagnosis not present

## 2018-08-01 DIAGNOSIS — N186 End stage renal disease: Secondary | ICD-10-CM | POA: Diagnosis not present

## 2018-08-01 DIAGNOSIS — E1122 Type 2 diabetes mellitus with diabetic chronic kidney disease: Secondary | ICD-10-CM

## 2018-08-01 DIAGNOSIS — Z992 Dependence on renal dialysis: Secondary | ICD-10-CM | POA: Diagnosis not present

## 2018-08-01 LAB — CBC
HCT: 27.8 % — ABNORMAL LOW (ref 39.0–52.0)
Hemoglobin: 8.8 g/dL — ABNORMAL LOW (ref 13.0–17.0)
MCH: 31.3 pg (ref 26.0–34.0)
MCHC: 31.7 g/dL (ref 30.0–36.0)
MCV: 98.9 fL (ref 80.0–100.0)
Platelets: 181 10*3/uL (ref 150–400)
RBC: 2.81 MIL/uL — ABNORMAL LOW (ref 4.22–5.81)
RDW: 17 % — ABNORMAL HIGH (ref 11.5–15.5)
WBC: 4.4 10*3/uL (ref 4.0–10.5)
nRBC: 0 % (ref 0.0–0.2)

## 2018-08-01 LAB — BASIC METABOLIC PANEL
Anion gap: 19 — ABNORMAL HIGH (ref 5–15)
BUN: 68 mg/dL — ABNORMAL HIGH (ref 6–20)
CO2: 18 mmol/L — ABNORMAL LOW (ref 22–32)
Calcium: 8.6 mg/dL — ABNORMAL LOW (ref 8.9–10.3)
Chloride: 96 mmol/L — ABNORMAL LOW (ref 98–111)
Creatinine, Ser: 14.87 mg/dL — ABNORMAL HIGH (ref 0.61–1.24)
GFR calc Af Amer: 4 mL/min — ABNORMAL LOW (ref >60)
GFR calc non Af Amer: 3 mL/min — ABNORMAL LOW (ref >60)
Glucose, Bld: 76 mg/dL (ref 70–99)
Potassium: 6.1 mmol/L — ABNORMAL HIGH (ref 3.5–5.1)
Sodium: 133 mmol/L — ABNORMAL LOW (ref 135–145)

## 2018-08-01 LAB — RENAL FUNCTION PANEL
Albumin: 2.9 g/dL — ABNORMAL LOW (ref 3.5–5.0)
Anion gap: 18 — ABNORMAL HIGH (ref 5–15)
BUN: 68 mg/dL — ABNORMAL HIGH (ref 6–20)
CO2: 21 mmol/L — ABNORMAL LOW (ref 22–32)
Calcium: 8.7 mg/dL — ABNORMAL LOW (ref 8.9–10.3)
Chloride: 94 mmol/L — ABNORMAL LOW (ref 98–111)
Creatinine, Ser: 15.04 mg/dL — ABNORMAL HIGH (ref 0.61–1.24)
GFR calc Af Amer: 4 mL/min — ABNORMAL LOW (ref >60)
GFR calc non Af Amer: 3 mL/min — ABNORMAL LOW (ref >60)
Glucose, Bld: 115 mg/dL — ABNORMAL HIGH (ref 70–99)
Phosphorus: 7.9 mg/dL — ABNORMAL HIGH (ref 2.5–4.6)
Potassium: 4.9 mmol/L (ref 3.5–5.1)
Sodium: 133 mmol/L — ABNORMAL LOW (ref 135–145)

## 2018-08-01 LAB — MRSA PCR SCREENING: MRSA by PCR: NEGATIVE

## 2018-08-01 LAB — GLUCOSE, CAPILLARY: Glucose-Capillary: 66 mg/dL — ABNORMAL LOW (ref 70–99)

## 2018-08-01 LAB — PROTIME-INR
INR: 2.1 — ABNORMAL HIGH (ref 0.8–1.2)
Prothrombin Time: 23.3 seconds — ABNORMAL HIGH (ref 11.4–15.2)

## 2018-08-01 MED ORDER — UNABLE TO FIND: 0 refills | Status: DC

## 2018-08-01 MED ORDER — HEPARIN SODIUM (PORCINE) 1000 UNIT/ML DIALYSIS: 1000.0000 [IU] | INTRAMUSCULAR | Status: DC | PRN

## 2018-08-01 MED ORDER — DARBEPOETIN ALFA 60 MCG/0.3ML IJ SOSY
PREFILLED_SYRINGE | INTRAMUSCULAR | Status: AC
  Filled 2018-08-01: qty 0.3

## 2018-08-01 MED ORDER — WARFARIN SODIUM 10 MG PO TABS
10.0000 mg | ORAL_TABLET | Freq: Once | ORAL | Status: DC
  Filled 2018-08-01: qty 1

## 2018-08-01 MED ORDER — MECLIZINE HCL 25 MG PO TABS: 25.0000 mg | ORAL_TABLET | Freq: Three times a day (TID) | ORAL | 0 refills | Status: DC | PRN

## 2018-08-01 MED ORDER — ACETAMINOPHEN 325 MG PO TABS: 650.0000 mg | ORAL_TABLET | Freq: Four times a day (QID) | ORAL | Status: DC | PRN

## 2018-08-01 MED ORDER — AMOXICILLIN 500 MG PO CAPS
500.0000 mg | ORAL_CAPSULE | ORAL | Status: DC
  Filled 2018-08-01: qty 1

## 2018-08-01 MED ORDER — ALTEPLASE 2 MG IJ SOLR: 2.0000 mg | Freq: Once | INTRAMUSCULAR | Status: DC | PRN

## 2018-08-01 NOTE — Evaluation (Addendum)
Physical Therapy Evaluation/Vestibular Assessment Patient Details Name: [name] MRN: [id number] DOB: 1960-06-18 Today's Date: 08/01/2018   History of Present Illness  58 y.o. male admitted on 07/31/18 for vertigo.  CT showed several supratentorial lacunar infarcts, MRI generally negative, but exam was not complete.  MD suspicious of BPPV.  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  Clinical Impression  Pt does show signs of BPPV, however, it was difficult for me to localize as there was rotational nystagmus bil in [place] and neither side was more/less symptomatic than the other side.  I was unable to give him x 1 gaze stability exercises or Nestor Lewandowsky exercises as he was nauseated at the end of our session.  PT will come back prior to d/c later today to review HEP.  He is appropriate for OP vestibular PT follow up at discharge.   PT to follow acutely for deficits listed below.      08/01/18 1342  Vital Signs (orthostatics)  BP (!) 84/62  BP Location Right Arm  BP Method Manual  Patient Position (if appropriate) Lying  Orthostatic Lying   BP- Lying (!) 84/62  Pulse- Lying 96  Orthostatic Sitting  BP- Sitting (!) 68/46  Pulse- Sitting 95  Orthostatic Standing at 0 minutes  BP- Standing at 0 minutes (!) 62/40  Pulse- Standing at 0 minutes 94  Pt unable to do 3 min standing BP due to lightheadedness.    Follow Up Recommendations Outpatient PT;Other (comment)(Vestibular rehab)    Equipment Recommendations  None recommended by PT    Recommendations for Other Services   NA    Precautions / Restrictions Precautions Precautions: Fall Precaution Comments: mildly unsteady per RN      Mobility  Bed Mobility Overal bed mobility: Modified Independent             General bed mobility comments: light use of rails, HOB elevated.  Per pt report he sleeps in a  recliner chair due to his CHF         Balance Overall balance assessment: Needs assistance Sitting-balance support: Feet supported;Bilateral upper extremity supported Sitting balance-Leahy Scale: Fair Sitting balance - Comments: some initial sway in sitting with reports of dizziness that seems to settle the longer he sits,   Standing balance support: No upper extremity supported Standing balance-Leahy Scale: Fair Standing balance comment: LEs supporte dy bed in standing.  Some (+) sway initially that dissipates the longer we stand.  Could not stand long enough to get a 3 min orthostatic BP as pt was too lightheaded (not dizzy).  He reports he sometimes gets lightheaded after HD.          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   Symptom Behavior  Subjective history of current problem sudden onset of dizziness when he stood up out of his chair at home.    Type of Dizziness  Spinning  Frequency of Dizziness with transitions  Duration of Dizziness short  Symptom Nature Positional;Motion provoked;Intermittent  Aggravating Factors Activity in general;Looking up to the ceiling;Sitting with head tilted back  Relieving Factors Lying supine;Head stationary;Rest  Progression of Symptoms No change since onset  History of similar episodes none  Oculomotor Exam  Oculomotor Alignment Abnormal  Spontaneous Absent  Gaze-induced  Absent  Smooth Pursuits Intact (but difficult to the left past midline)  Saccades Intact  Vestibulo-Ocular  Reflex  VOR 1 Head Only (x 1 viewing) significant difficulty and symptomatic with vertical head shaking more than horizontal   VOR Cancellation Normal  Comment skew test normal, HIT negative bil  Auditory  Comments grossly intact with finger rub test  Positional Testing  Dix-Hallpike Dix-Hallpike Right;Dix-Hallpike Left  Horizontal Canal Testing Horizontal Canal Right;Horizontal Canal Left;Horizontal Canal Right Intensity;Horizontal Canal Left Intensity  Dix-Hallpike Right  Dix-Hallpike Right Duration <45 seconds  Dix-Hallpike Right Symptoms Upbeat, right rotatory nystagmus  Dix-Hallpike Left  Dix-Hallpike Left Duration <45 seconds   Dix-Hallpike Left Symptoms Downbeat, left rotatory nystagmus  Horizontal Canal Right  Horizontal Canal Right Duration 0  Horizontal Canal Right Symptoms Normal  Horizontal Canal Left  Horizontal Canal Left Duration 0  Horizontal Canal Left Symptoms Normal   08/01/2018 Pt had no more intensity of symptoms on the right side vs left side dix hallpike.  He had more symptoms coming up out of the testing positions, but no associated nystagmus.  Because it is not clear which side the canal issue could be, I will give him Nestor Lewandowsky exercises for habituation and x 1 gaze stability exercises in sitting (both vertical and horizontal) for his intolerance of gaze stability testing.  I do recommend he follow up with an eye doctor given how long it has been since he has had an exam and his increased blurriness.  His baseline R eye deficits and his history of DM predispose him to gaze stability/progressive eye issues.                         Pertinent Vitals/Pain Pain Assessment: Faces Faces Pain Scale: Hurts little more Pain Location: head after vestibular testing Pain Descriptors / Indicators: Aching Pain Intervention(s): Limited activity within patient's tolerance;Monitored during session;Repositioned    Home Living Family/patient expects to be discharged  to:: Private residence Living Arrangements: Spouse/significant other Available Help at Discharge: Family;Available PRN/intermittently(wife works during the day) Type of Home: House Home Access: Stairs to enter   Technical brewer of Steps: 1 Home Layout: One level Home Equipment: Environmental consultant - 2 wheels;Crutches      Prior Function Level of Independence: Independent         Comments: drives himself to dialysis; enjoys mowing lawns     Hand Dominance   Dominant Hand: Right    Extremity/Trunk Assessment   Upper Extremity Assessment Upper Extremity Assessment: Overall WFL for tasks assessed    Lower Extremity Assessment Lower Extremity Assessment: Overall WFL for tasks assessed    Cervical / Trunk Assessment Cervical / Trunk Assessment: Normal  Communication   Communication: No difficulties  Cognition Arousal/Alertness: Awake/alert Behavior During Therapy: WFL for tasks assessed/performed Overall Cognitive Status: Within Functional Limits for tasks assessed                                        General Comments General comments (skin integrity, edema, etc.): Pt  legally blind in R eye, wears reading glasses.  Has not had his eyes checked in years, but has an appointment soon.         Assessment/Plan    PT Assessment Patient needs continued PT services  PT Problem List Decreased activity tolerance;Decreased balance;Decreased mobility;Decreased knowledge of use of DME       PT Treatment Interventions DME instruction;Gait training;Stair training;Functional mobility training;Therapeutic activities;Neuromuscular re-education;Balance training;Therapeutic exercise;Patient/family education    PT Goals (Current goals can be found in the Care Plan section)  Acute Rehab PT Goals Patient Stated Goal: to stop being dizzy so he can get back to doing his normal activities PT Goal Formulation: With patient Time For Goal Achievement: 08/15/18 Potential to  Achieve Goals: Good    Frequency Min 3X/week           AM-PAC PT "6 Clicks" Mobility  Outcome Measure Help needed turning from your back to your side while in a flat bed without using bedrails?: A Little Help needed moving from lying on your back to sitting on the side of a flat bed without using bedrails?: A Little Help needed moving to and from a bed to a chair (including a wheelchair)?: A Little Help needed standing up from a chair using your arms (e.g., wheelchair or bedside chair)?: None Help needed to walk in hospital room?: A Little Help needed climbing 3-5 steps with a railing? : A Little 6 Click Score: 19    End of Session   Activity Tolerance: Other (comment)(limited by lightheadedness/dizziness) Patient left: in bed;with call bell/phone within reach;with nursing/sitter in room Nurse Communication: Mobility status PT Visit Diagnosis: Unsteadiness on feet (R26.81);Dizziness and giddiness (R42)    Time: 0076-2263 PT Time Calculation (min) (ACUTE ONLY): 49 min   Charges:            Wells Guiles B. Jahziah Simonin, PT, DPT  Acute Rehabilitation #[phone] pager #[phone] office   PT Evaluation $PT Eval Moderate Complexity: 1 Mod PT Treatments $Therapeutic Activity: 23-37 mins   08/01/2018, 2:48 PM

## 2018-08-01 NOTE — Progress Notes (Signed)
[name] Progress Note   Assessment/ Plan:    Dialyzes at [facility]  MWF 4 hrs 450/800 EDW 106 HD Bath 2K 2.0 Ca, Dialyzer F180, Heparin 2000 u bolus. Access L FA AVF. hectorol 4 mcg TIW Mircera 50 mcg q 2 weeks (to start today) parsabiv 12.5 TIW  1 Vertigo/ dizziness- sounds like BPPV vs cerebellar infarct but not able to get full MRI d/t intolerance.  Given h/o CVA, likely will need to complete- per primary.  May need vestibular rehab pending MRI results.  Primary added baby ASA as well. Improved per pt.    2 ESRD: MWF normally- off schedule today and then back on MWF schedule 3 Hypotension: gets midodrine 15 TID on HD days and 10 TID non-dialysis days 4. 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 Metabolic Bone Disease: on velphoro and auryxia- on just [place] here and follow P (hopefully can get off one binder).   6.  Nutrition- renal diet 7.  Epigastric pain- per primary, ? If needs scope, h/o dark stools hard to interpret in setting of iron-containing binders, Hgb as OP largely stable 8.  L wrist pain- ? Gout- improved after colchicine dose 9.  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   10.  Afib/flutter- on coumadin 11.  Dispo: pending  Subjective:    Dizziness much better overnight.  L wrist pain improved after colchicine administration.  Feeling better.     Objective:   BP 116/64   Pulse 82   Temp 97.8 F (36.6 C) (Oral)   Resp (!) 21   Ht 5' 5.8" (1.671 m)   Wt 111.8 kg   SpO2 100%   BMI 40.02 kg/m   Physical Exam: Gen: NAD, sitting in bed CVS: RRR no m/r/g Resp: clear bilaterally Abd: obese, nontender NABS Ext: trace LE edema ACCESS: L AVF + T/B  Labs: BMET Recent Labs  Lab 07/31/18 0705 07/31/18 0848 08/01/18 0438 08/01/18 0755  NA 133*  --  133* 133*  K 5.7* 5.1 6.1* 4.9  CL 95*  --  96* 94*  CO2 22  --  18* 21*  GLUCOSE 86  --  76 115*  BUN 54*  --  68* 68*  CREATININE 12.77*  --  14.87* 15.04*   CALCIUM 9.0  --  8.6* 8.7*  PHOS  --   --   --  7.9*   CBC Recent Labs  Lab 07/31/18 0705 08/01/18 0755  WBC 5.7 4.4  HGB 10.6* 8.8*  HCT 33.8* 27.8*  MCV 100.0 98.9  PLT 180 181    @IMGRELPRIORS @ Medications:    . allopurinol  150 mg Oral QODAY  . amiodarone  100 mg Oral Daily  . amoxicillin  500 mg Oral Q24H  . aspirin EC  81 mg Oral Daily  . atorvastatin  40 mg Oral q1800  . calcium acetate  2,001 mg Oral TID WC  . Chlorhexidine Gluconate Cloth  6 each Topical Q0600  . darbepoetin (ARANESP) injection - DIALYSIS  60 mcg Intravenous Q Tue-HD  . feeding supplement (PRO-STAT SUGAR FREE 64)  30 mL Oral BID  . ferric citrate  210 mg Oral TID WC  . heparin  2,000 Units Dialysis Once in dialysis  . insulin aspart  0-9 Units Subcutaneous TID WC  . meclizine  25 mg Oral TID  . midodrine  10 mg Oral Once per day on Sun Tue Thu Sat  . [START ON 08/02/2018]  midodrine  15 mg Oral Q M,W,F-HD  . mirtazapine  7.5 mg Oral QHS  . multivitamin  1 tablet Oral QHS  . pantoprazole  40 mg Oral BID  . Warfarin - Pharmacist Dosing Inpatient   Does not apply [place], MD [facility] pgr [phone] 08/01/2018, 9:15 AM

## 2018-08-01 NOTE — Progress Notes (Addendum)
Physical Therapy Treatment Patient Details Name: [name] MRN: [id number] DOB: 1961/03/16 Today's Date: 08/01/2018    History of Present Illness 58 y.o. male admitted on 07/31/18 for vertigo.  CT showed several supratentorial lacunar infarcts, MRI generally negative, but exam was not complete.  MD suspicious of BPPV.  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    PT Comments    PT came back to review vestibular HEP with pt (x1 seated exercises vertical and horizontal) and [employer] exercises.  [employer] HEP given (printed/access code: [email or web address]) as well as 2-target "A"s.  Therapist and pt reviewed exercises so he could do them at home.  Pt is due to d/c home today, but if he doesn't therapist will review one round of all exercises again tomorrow and try to get pt walking down the hallway.  PT to follow acutely until d/c confirmed.    Follow Up Recommendations  Outpatient PT;Other (comment)(vestibular rehab)     Equipment Recommendations  None recommended by PT    Recommendations for Other Services   NA     Precautions / Restrictions Precautions Precautions: Fall Precaution Comments: mildly unsteady per RN    Mobility  Bed Mobility Overal bed mobility: Modified Independent             General bed mobility comments: light use of rails, HOB elevated.  Per pt report he sleeps in a recliner chair due to his CHF  Transfers Overall transfer level: Needs assistance Equipment used: None Transfers: Sit to/from Stand Sit to Stand: Supervision         General transfer comment: supervision for safety      Balance Overall balance assessment: Needs assistance Sitting-balance support: Feet supported;Bilateral upper extremity supported;No upper extremity supported Sitting balance-Leahy Scale: Fair Sitting balance - Comments: continues to have dizziness in sitting    Standing balance support: No upper extremity supported Standing balance-Leahy Scale: Fair Standing balance comment: close supervision standing EOB.                             Cognition Arousal/Alertness: Awake/alert Behavior During Therapy: WFL for tasks assessed/performed Overall Cognitive Status: Within Functional Limits for tasks assessed                                        Exercises Other Exercises Other Exercises: x1 seated exercises vertical and horizontal with goal time 60 seconds.  Handout given as well as target "A"s Other Exercises: Nestor Lewandowsky exercises given to preform 3 times per day, 3 sets.  We reviewed x 1 (not three because he just ate and is preparing for d/c home).      General Comments General comments (skin integrity, edema, etc.): Pt legally blind in R eye, wears reading glasses.  Has not had his eyes checked in years, but has an appointment soon.       Pertinent Vitals/Pain Pain Assessment: No/denies pain Faces Pain Scale: Hurts little more Pain Location: head after vestibular testing Pain Descriptors / Indicators: Aching Pain Intervention(s): Limited activity within patient's tolerance;Monitored during session;Repositioned    Home Living Family/patient expects to be discharged to:: Private residence Living Arrangements: Spouse/significant other Available Help at Discharge: Family;Available PRN/intermittently(wife works during the day) Type of Home:  House Home Access: Stairs to enter   Home Layout: One level Home Equipment: Environmental consultant - 2 wheels;Crutches      Prior Function Level of Independence: Independent      Comments: drives himself to dialysis; enjoys mowing lawns   PT Goals (current goals can now be found in the care plan section) Acute Rehab PT Goals Patient Stated Goal: to stop being dizzy so he can get back to doing his normal activities PT Goal Formulation: With patient Time For Goal Achievement:  08/15/18 Potential to Achieve Goals: Good Progress towards PT goals: Progressing toward goals    Frequency    Min 3X/week      PT Plan Current plan remains appropriate       AM-PAC PT "6 Clicks" Mobility   Outcome Measure  Help needed turning from your back to your side while in a flat bed without using bedrails?: A Little Help needed moving from lying on your back to sitting on the side of a flat bed without using bedrails?: A Little Help needed moving to and from a bed to a chair (including a wheelchair)?: A Little Help needed standing up from a chair using your arms (e.g., wheelchair or bedside chair)?: None Help needed to walk in hospital room?: A Little Help needed climbing 3-5 steps with a railing? : A Little 6 Click Score: 19    End of Session   Activity Tolerance: Other (comment)(limited by dizziness) Patient left: in bed;with call bell/phone within reach Nurse Communication: Mobility status PT Visit Diagnosis: Unsteadiness on feet (R26.81);Dizziness and giddiness (R42)     Time: 2563-8937 PT Time Calculation (min) (ACUTE ONLY): 20 min  Charges:  $Therapeutic Exercise: 8-22 mins $Therapeutic Activity: 23-37 mins                    [name], PT, DPT  Acute Rehabilitation [phone] pager #[phone] office   08/01/2018, 4:47 PM

## 2018-08-01 NOTE — Discharge Summary (Signed)
Physician Discharge Summary  [name] VFI:[id number] DOB: 10/13/1960 DOA: 07/31/2018  PCP: Shelby Mattocks, PA-C  Admit date: 07/31/2018 Discharge date: 08/01/2018  Admitted From: home Disposition:  home   Recommendations for Outpatient Follow-up:  1. outpt vestibular PT  Discharge Condition:  stable   CODE STATUS:  Full code   Diet recommendation:  Renal, heart healthy Consultations:  nephrology   Discharge Diagnoses:  Principal Problem:   Vertigo Active Problems:   Hypertension, benign   DM II (diabetes mellitus, type II), controlled ([place])   Gout, chronic   History of nephrectomy   ESRD (end stage renal disease) on dialysis ([place])   Paroxysmal atrial flutter ([place])   History of atrial flutter    Brief Summary: [name] is a 58 y.o. male with history h/o ESRD on hemodialysis MWF, paroxysmal atrial flutter on anticoagulation with Coumadin, renal cell carcinoma status post right-sided nephrectomy, left renal cyst with complication of rupture in September 2019, anorectal abscess/fistula being followed by general surgery and scheduled for repair on March 24, history of PE status post IVC filter and anticoagulation, systolic CHF with EF of 30 to 35% presents today with complaints of dizzy spells described as spinning sensation going on for 3 days.  In ED:  CT head> Scattered supratentorial and infratentorial lacunar type infarcts.  MRI>  Negative for acute infarct  Hospital Course:  Vertigo - CT head notes infarcts and MRI was incomplete but did not see and infarct- Dr Cheral Marker has reviewed the imaging and states that the lacunar infarct on the CT are old and there are no new infarcts on the MRI.  - likely BPPV- occurs when he turns his head to the left- PT has evaluated him and I have ordered outpt vestibular therapy - no trouble with ambulation  - continue Coumadin - no need to add any other medications  Anal fistula - cont Amoxicillan- he will f/u with general  surgery for repair on 08/15/18  ESRD - received HD today  All other medical problems remained stable   Discharge Exam: Vitals:   08/01/18 1347 08/01/18 1403  BP: (!) 68/46 (!) 80/56  Pulse:  95  Resp:  16  Temp:    SpO2:     Vitals:   08/01/18 1330 08/01/18 1342 08/01/18 1347 08/01/18 1403  BP: (!) 72/47 (!) 84/62 (!) 68/46 (!) 80/56  Pulse: 93   95  Resp:    16  Temp:      TempSrc:      SpO2: 100%     Weight:      Height:        General: Pt is alert, awake, not in acute distress Cardiovascular: RRR, S1/S2 +, no rubs, no gallops Respiratory: CTA bilaterally, no wheezing, no rhonchi Abdominal: Soft, NT, ND, bowel sounds + Extremities: no edema, no cyanosis   Discharge Instructions  Discharge Instructions    Increase activity slowly   Complete by:  As directed      Allergies as of 08/01/2018      Reactions   Ace Inhibitors Itching, Cough      Medication List    TAKE these medications   acetaminophen 325 MG tablet Commonly known as:  TYLENOL Take 2 tablets (650 mg total) by mouth every 6 (six) hours as needed for mild pain (or Fever >/= 101).   allopurinol 300 MG tablet Commonly known as:  ZYLOPRIM Take 150 mg by mouth every other day.   amiodarone 200 MG tablet Commonly known as:  PACERONE Take 0.5 tablets (100 mg total) by mouth daily.   amoxicillin 875 MG tablet Commonly known as:  AMOXIL Take 875 mg by mouth daily.   Auryxia 1 GM 210 MG(Fe) tablet Generic drug:  ferric citrate Take 210 mg by mouth 3 (three) times daily with meals.   calcium acetate 667 MG capsule Commonly known as:  PHOSLO Take 667-2,001 mg by mouth See admin instructions. Take 2001 mg  capsules with meals and 800 mg capsule with snacks.   chlorhexidine 0.12 % solution Commonly known as:  PERIDEX 15 mLs by Mouth Rinse route daily as needed. Mouth rinse   doxercalciferol 4 MCG/2ML injection Commonly known as:  HECTOROL Inject 1.5 mLs (3 mcg total) into the vein every  Monday, Wednesday, and Friday with hemodialysis.   feeding supplement (PRO-STAT SUGAR FREE 64) Liqd Take 30 mLs by mouth 2 (two) times daily.   hydrocortisone 25 MG suppository Commonly known as:  ANUSOL-HC Place 1 suppository (25 mg total) rectally every 12 (twelve) hours.   isosorbide dinitrate 20 MG tablet Commonly known as:  ISORDIL Take 20 mg by mouth 2 (two) times daily.   Lidocaine (Anorectal) 5 % Crea Commonly known as:  L-M-X 5 Apply 1 application topically 4 (four) times daily as needed (apply to area up to 6 times daily as needed).   lidocaine-prilocaine cream Commonly known as:  EMLA Apply 1 application topically See admin instructions. Apply topically to port access prior to dialysis - Monday, Wednesday, Friday   meclizine 25 MG tablet Commonly known as:  ANTIVERT Take 1 tablet (25 mg total) by mouth 3 (three) times daily as needed for dizziness.   midodrine 10 MG tablet Commonly known as:  PROAMATINE 30 mg  on the days of dyalisis three times a week, and only takes 10 mg on off dyalisis days. What changed:    how much to take  how to take this  when to take this  additional instructions   mirtazapine 7.5 MG tablet Commonly known as:  REMERON Take 1 tablet (7.5 mg total) by mouth at bedtime.   multivitamin Tabs tablet Take 1 tablet by mouth at bedtime.   nitroGLYCERIN 0.4 MG SL tablet Commonly known as:  NITROSTAT Place 0.4 mg under the tongue every 5 (five) minutes as needed for chest pain.   pantoprazole 40 MG tablet Commonly known as:  PROTONIX Take 40 mg by mouth 2 (two) times daily.   UNABLE TO FIND Out patient physical therapy- vestibular therapy for BPPV   warfarin 10 MG tablet Commonly known as:  COUMADIN Take 1 tablet (10 mg total) by mouth daily at 6 PM.       Allergies  Allergen Reactions  . Ace Inhibitors Itching and Cough     Procedures/Studies:    Dg Chest 2 View  Result Date: 07/31/2018 CLINICAL DATA:  Shortness of  breath and dizziness EXAM: CHEST - 2 VIEW COMPARISON:  01/27/2018 FINDINGS: Low volume chest with interstitial crowding. There is no edema, consolidation, effusion, or pneumothorax. Chronic cardiomegaly. IMPRESSION: 1. No evidence of acute disease. 2. Cardiomegaly. Electronically Signed   By: Monte Fantasia M.D.   On: 07/31/2018 06:48   Ct Head Wo Contrast  Result Date: 07/31/2018 CLINICAL DATA:  Episodic vertigo.  Renal failure EXAM: CT HEAD WITHOUT CONTRAST TECHNIQUE: Contiguous axial images were obtained from the base of the skull through the vertex without intravenous contrast. COMPARISON:  February 01, 2018 FINDINGS: Brain: The ventricles and sulci are within normal limits for  age. There is no intracranial mass, hemorrhage, extra-axial fluid collection, or midline shift. There is mild small vessel disease in the centra semiovale bilaterally. There are occasional nonacute appearing lacunar type infarcts within each centrum semiovale. There is evidence of a prior infarct in the medial right upper pons, stable. No acute infarct is demonstrable on this study. Vascular: No hyperdense vessel. There is calcification in each carotid siphon and distal left vertebral artery. The basilar artery is somewhat dolichoectatic, stable. Skull: Bony calvarium appears intact. Sinuses/Orbits: There is mucosal thickening in several ethmoid air cells. Other visualized paranasal sinuses are clear. Orbits appear symmetric bilaterally. Other: There is mucosal thickening in several medial mastoid air cells bilaterally. The middle and inner ear regions appear symmetric bilaterally. IMPRESSION: Scattered supratentorial and infratentorial lacunar type infarcts. No acute infarct evident. There is mild periventricular small vessel disease. No mass or hemorrhage. Multiple foci of arterial vascular calcification noted. There is mucosal thickening in several ethmoid air cells. There is mucosal thickening in several medial mastoid air cells  bilaterally. Electronically Signed   By: Lowella Grip III M.D.   On: 07/31/2018 07:13   Mr Brain Wo Contrast  Result Date: 07/31/2018 CLINICAL DATA:  Vertigo persistent, central EXAM: MRI HEAD WITHOUT CONTRAST TECHNIQUE: Multiplanar, multiecho pulse sequences of the brain and surrounding structures were obtained without intravenous contrast. COMPARISON:  CT head 07/31/2018 FINDINGS: Incomplete exam. The patient refused to complete the exam. Sagittal T1 and axial and coronal diffusion sequences performed Negative for acute infarct. Ventricle size normal. Chronic infarcts in the pons bilaterally and in the white matter bilaterally. IMPRESSION: Incomplete exam.  Negative for acute infarct. Electronically Signed   By: Franchot Gallo M.D.   On: 07/31/2018 11:27   Ct Abdomen Pelvis W Contrast  Result Date: 07/09/2018 CLINICAL DATA:  Blood in stool. EXAM: CT ABDOMEN AND PELVIS WITH CONTRAST TECHNIQUE: Multidetector CT imaging of the abdomen and pelvis was performed using the standard protocol following bolus administration of intravenous contrast. CONTRAST:  173mL OMNIPAQUE IOHEXOL 300 MG/ML  SOLN COMPARISON:  January 26, 2018 FINDINGS: Lower chest: There is mild scarring in the lateral right base with mild localized pleural thickening posterolaterally on the right. No lung base edema or consolidation is evident. There are foci of coronary artery calcification. There is mild gynecomastia bilaterally. Hepatobiliary: There is a 9 mm cyst in the left lobe of the liver. No other focal liver lesions are evident. The gallbladder is absent. There is no appreciable biliary duct dilatation. Pancreas: No pancreatic mass or inflammatory focus. Spleen: No splenic lesions are evident. Adrenals/Urinary Tract: Adrenals bilaterally appear normal. Patient is status post right nephrectomy with clips in the pararenal fossa on the right. No mass or inflammatory focus is noted in this area. The left kidney shows evidence of  scarring and a fluid containing area measuring 3.7 x 3.0 cm. There is very little recognizable renal parenchyma on the left. There are coils in the left renal artery. There is perinephric stranding on the left. There is no demonstrable renal or ureteral calculus. No hydronephrosis appreciable. Urinary bladder is collapsed. Stomach/Bowel: There is air in the rectum. There is no rectal wall thickening or perirectal stranding. Elsewhere, there is no appreciable bowel wall or mesenteric thickening. There is no evident bowel obstruction. No free air or portal venous air evident. Vascular/Lymphatic: There is aortic and iliac artery atherosclerosis. There is calcification at the origins of the celiac and superior mesenteric artery without evident hemodynamically significant obstructive disease. There is a filter  in the inferior vena cava. No adenopathy is evident in the abdomen or pelvis. Reproductive: Prostate and seminal vesicles appear normal in size and contour. No evident pelvic mass. Other: The appendix appears normal. There is no abscess or ascites in the abdomen or pelvis. There is a focal ventral hernia, fairly small, containing fat but no bowel. Musculoskeletal: There is degenerative change in the lower thoracic and lumbar spine regions. There is also degenerative type change in the pubic symphysis region. There are no blastic or lytic bone lesions. There is no intramuscular lesion. IMPRESSION: 1. Embolization coils are noted in the left renal artery region. There is very little recognizable left renal parenchyma. There is a cystic area measuring 3.7 x 3.0 cm with surrounding perinephric stranding on the left. No left renal hydronephrosis. No ureteral or renal calculi on the left. Urinary bladder collapsed. 2. Status post right nephrectomy. No lesion in the right pararenal fossa. 3. No rectal wall thickening or proctitis. No stranding in the soft tissues in this area or abscess. No bowel obstruction evident. A  cause for blood in stool has not been established with this study. This finding may warrant direct colonic visualization following bowel preparation. 4.  Focal ventral hernia containing fat but no bowel. 5. Aortoiliac atherosclerosis. Foci of coronary artery calcification noted. 6.  Mild gynecomastia. Electronically Signed   By: Lowella Grip III M.D.   On: 07/09/2018 08:02     The results of significant diagnostics from this hospitalization (including imaging, microbiology, ancillary and laboratory) are listed below for reference.     Microbiology: Recent Results (from the past 240 hour(s))  MRSA PCR Screening     Status: None   Collection Time: 07/31/18  7:25 PM  Result Value Ref Range Status   MRSA by PCR NEGATIVE NEGATIVE Final    Comment:        The GeneXpert MRSA Assay (FDA approved for NASAL specimens only), is one component of a comprehensive MRSA colonization surveillance program. It is not intended to diagnose MRSA infection nor to guide or monitor treatment for MRSA infections. Performed at [facility], [place] [address], [place], [place] [address]      Labs: BNP (last 3 results) No results for input(s): BNP in the last 8760 hours. Basic Metabolic Panel: Recent Labs  Lab 07/31/18 0705 07/31/18 0848 08/01/18 0438 08/01/18 0755  NA 133*  --  133* 133*  K 5.7* 5.1 6.1* 4.9  CL 95*  --  96* 94*  CO2 22  --  18* 21*  GLUCOSE 86  --  76 115*  BUN 54*  --  68* 68*  CREATININE 12.77*  --  14.87* 15.04*  CALCIUM 9.0  --  8.6* 8.7*  PHOS  --   --   --  7.9*   Liver Function Tests: Recent Labs  Lab 08/01/18 0755  ALBUMIN 2.9*   No results for input(s): LIPASE, AMYLASE in the last 168 hours. No results for input(s): AMMONIA in the last 168 hours. CBC: Recent Labs  Lab 07/31/18 0705 08/01/18 0755  WBC 5.7 4.4  HGB 10.6* 8.8*  HCT 33.8* 27.8*  MCV 100.0 98.9  PLT 180 181   Cardiac Enzymes: No results for input(s): CKTOTAL, CKMB, CKMBINDEX,  TROPONINI in the last 168 hours. BNP: Invalid input(s): POCBNP CBG: Recent Labs  Lab 07/31/18 1814 07/31/18 2121 07/31/18 2125 08/01/18 1307  GLUCAP 86 74 89 66*   D-Dimer No results for input(s): DDIMER in the last 72 hours.  Hgb A1c Recent Labs    07/31/18 1807  HGBA1C 5.1   Lipid Profile No results for input(s): CHOL, HDL, LDLCALC, TRIG, CHOLHDL, LDLDIRECT in the last 72 hours. Thyroid function studies No results for input(s): TSH, T4TOTAL, T3FREE, THYROIDAB in the last 72 hours.  Invalid input(s): FREET3 Anemia work up No results for input(s): VITAMINB12, FOLATE, FERRITIN, TIBC, IRON, RETICCTPCT in the last 72 hours. Urinalysis    Component Value Date/Time   COLORURINE YELLOW 05/19/2011 1042   APPEARANCEUR CLEAR 05/19/2011 1042   LABSPEC 1.013 05/19/2011 1042   PHURINE 6.5 05/19/2011 1042   GLUCOSEU NEGATIVE 05/19/2011 1042   HGBUR TRACE (A) 05/19/2011 1042   BILIRUBINUR NEGATIVE 05/19/2011 1042   KETONESUR NEGATIVE 05/19/2011 1042   PROTEINUR >300 (A) 05/19/2011 1042   UROBILINOGEN 0.2 05/19/2011 1042   NITRITE NEGATIVE 05/19/2011 1042   LEUKOCYTESUR NEGATIVE 05/19/2011 1042   Sepsis Labs Invalid input(s): PROCALCITONIN,  WBC,  LACTICIDVEN Microbiology Recent Results (from the past 240 hour(s))  MRSA PCR Screening     Status: None   Collection Time: 07/31/18  7:25 PM  Result Value Ref Range Status   MRSA by PCR NEGATIVE NEGATIVE Final    Comment:        The GeneXpert MRSA Assay (FDA approved for NASAL specimens only), is one component of a comprehensive MRSA colonization surveillance program. It is not intended to diagnose MRSA infection nor to guide or monitor treatment for MRSA infections. Performed at [facility], [place] [address], [place], [place] [address]      Time coordinating discharge in minutes: 28  SIGNED:   Debbe Odea, MD  [facility] 08/01/2018, 2:34 PM Pager   If 7PM-7AM, please contact  night-coverage [email or web address] Password [email or web address]

## 2018-08-01 NOTE — Progress Notes (Signed)
ANTICOAGULATION CONSULT NOTE - Initial Consult  Pharmacy Consult for warfarin Indication: atrial fibrillation  Allergies  Allergen Reactions  . Ace Inhibitors Itching and Cough   Vital Signs: Temp: 97.8 F (36.6 C) (03/10 0724) Temp Source: Oral (03/10 0724) BP: 82/49 (03/10 1000) Pulse Rate: 88 (03/10 1000)  Labs: Recent Labs    07/31/18 0705 08/01/18 0438 08/01/18 0755  HGB 10.6*  --  8.8*  HCT 33.8*  --  27.8*  PLT 180  --  181  LABPROT 21.7* 23.3*  --   INR 1.9* 2.1*  --   CREATININE 12.77* 14.87* 15.04*    Estimated Creatinine Clearance: 6.5 mL/min (A) (by C-G formula based on SCr of 15.04 mg/dL (H)).   Medical History: Past Medical History:  Diagnosis Date  . Atrial flutter ([place])   . 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 Cardiomyopathy   . CHF (congestive heart failure) ([place])   . Coronary artery disease    normal coronaries by 10/10/08 cath, Cardiac Cath 08-04-12 epic.Dr. Doylene Canard follows  . Diabetes mellitus    pt. states he's borderline diabetic., no longer taking med,- off med. since 2013  . Dialysis patient [facility])    Mon-Wed-Fri([place])- Left AV fistula  . Diverticulitis November 2016   reoccurred in December 2016  . ESRD (end stage renal disease) ([place])   . GERD (gastroesophageal reflux disease)   . Gout   . History of nephrectomy 07/04/2012   History of right nephrectomy in 2002 for renal cell carcinoma   . History of unilateral nephrectomy   . Hypertension   . Low iron   . Myocardial infarction [facility]) ?2006  . Renal cell carcinoma    dialysis- MWF- Dr. Mercy Moore follows.  . Renal insufficiency   . Shortness of breath 05/19/11   "at rest, lying down, w/exertion"  . Stroke [facility]) 02/2011   05/19/11 denies residual  . Umbilical hernia 40/98/11   unrepaired   Assessment: 77 yoM admitted 3/9 for vertigo. Patient on warfarin PTA for PAF. Pharmacy has been consulted for warfarin dosing. Patient last reports  taking warfarin on 07/30/18. INR slighly subtherapeutic on admission at 1.9. CBC stable -  Hgb 10.6, HCT 33.8 and pltc 180 on admission.   INR today 2.1     PTA dose: warfarin 10 mg PO daily  Goal of Therapy:  INR 2-3   Plan:  Give Warfarin 10 mg x 1 tonight Monitor daily INR and for s/sx of bleeding  Lateka Rady A. Levada Dy, PharmD, [place] Please utilize Amion for appropriate phone number to reach the unit pharmacist ([place])    08/01/2018,10:26 AM

## 2018-08-01 NOTE — Progress Notes (Signed)
CM talked to patient at the bedside, he is agreeable to Outpatient Physical Therapy- orders/ clinical information faxed to the [facility]; Aneta Mins [phone]

## 2018-08-01 NOTE — Procedures (Signed)
Patient seen and examined on Hemodialysis. BP 116/64   Pulse 82   Temp 97.8 F (36.6 C) (Oral)   Resp (!) 21   Ht 5' 5.8" (1.671 m)   Wt 111.8 kg   SpO2 100%   BMI 40.02 kg/m   QB 400 mL/ min via AVF, UF goal 3L.   Tolerating treatment without complaints at this time.  Has IDH for which he requires midodrine.   [name] MD [facility] pgr [phone] 9:22 AM

## 2018-08-01 NOTE — Progress Notes (Signed)
PT Cancellation Note  Patient Details Name: [name] MRN: [id number] DOB: [birth year]   Cancelled Treatment:    Reason Eval/Treat Not Completed: Patient at procedure or test/unavailable(Pt in HD.  Will check back as able.  )   Denice Paradise 08/01/2018, 8:31 AM  [place] Pager:  [phone]  Office:  [phone]

## 2018-08-02 DIAGNOSIS — E1129 Type 2 diabetes mellitus with other diabetic kidney complication: Secondary | ICD-10-CM | POA: Diagnosis not present

## 2018-08-02 DIAGNOSIS — N186 End stage renal disease: Secondary | ICD-10-CM | POA: Diagnosis not present

## 2018-08-02 DIAGNOSIS — D631 Anemia in chronic kidney disease: Secondary | ICD-10-CM | POA: Diagnosis not present

## 2018-08-02 DIAGNOSIS — N2581 Secondary hyperparathyroidism of renal origin: Secondary | ICD-10-CM | POA: Diagnosis not present

## 2018-08-04 DIAGNOSIS — D689 Coagulation defect, unspecified: Secondary | ICD-10-CM | POA: Diagnosis not present

## 2018-08-04 DIAGNOSIS — E1129 Type 2 diabetes mellitus with other diabetic kidney complication: Secondary | ICD-10-CM | POA: Diagnosis not present

## 2018-08-04 DIAGNOSIS — D631 Anemia in chronic kidney disease: Secondary | ICD-10-CM | POA: Diagnosis not present

## 2018-08-04 DIAGNOSIS — N2581 Secondary hyperparathyroidism of renal origin: Secondary | ICD-10-CM | POA: Diagnosis not present

## 2018-08-04 DIAGNOSIS — N186 End stage renal disease: Secondary | ICD-10-CM | POA: Diagnosis not present

## 2018-08-05 LAB — PROTIME-INR: INR: 2.2 — AB (ref 0.9–1.1)

## 2018-08-07 DIAGNOSIS — E1129 Type 2 diabetes mellitus with other diabetic kidney complication: Secondary | ICD-10-CM | POA: Diagnosis not present

## 2018-08-07 DIAGNOSIS — N186 End stage renal disease: Secondary | ICD-10-CM | POA: Diagnosis not present

## 2018-08-07 DIAGNOSIS — N2581 Secondary hyperparathyroidism of renal origin: Secondary | ICD-10-CM | POA: Diagnosis not present

## 2018-08-07 DIAGNOSIS — D631 Anemia in chronic kidney disease: Secondary | ICD-10-CM | POA: Diagnosis not present

## 2018-08-08 DIAGNOSIS — H2513 Age-related nuclear cataract, bilateral: Secondary | ICD-10-CM | POA: Diagnosis not present

## 2018-08-08 DIAGNOSIS — E119 Type 2 diabetes mellitus without complications: Secondary | ICD-10-CM | POA: Diagnosis not present

## 2018-08-09 DIAGNOSIS — D689 Coagulation defect, unspecified: Secondary | ICD-10-CM | POA: Diagnosis not present

## 2018-08-09 DIAGNOSIS — N186 End stage renal disease: Secondary | ICD-10-CM | POA: Diagnosis not present

## 2018-08-09 DIAGNOSIS — D631 Anemia in chronic kidney disease: Secondary | ICD-10-CM | POA: Diagnosis not present

## 2018-08-09 DIAGNOSIS — N2581 Secondary hyperparathyroidism of renal origin: Secondary | ICD-10-CM | POA: Diagnosis not present

## 2018-08-09 DIAGNOSIS — E1129 Type 2 diabetes mellitus with other diabetic kidney complication: Secondary | ICD-10-CM | POA: Diagnosis not present

## 2018-08-09 NOTE — Pre-Procedure Instructions (Addendum)
[name]  08/09/2018      [id number] [name], [name] [name] [address] Phone: [phone] Fax: [phone]    Your procedure is scheduled on Mar. 24  Report to [facility] Admitting Entrance A at 11:00 A.M.  Call this number if you have problems the morning of surgery:  [phone]   Remember:  Do not eat  after midnight.  You may drink clear liquids until 10:00 A. M. .  Clear liquids allowed are:                    Water, Juice (non-citric and without pulp), Carbonated beverages, Clear Tea, Black Coffee only, Plain Jell-O only and Gatorade    Take these medicines the morning of surgery with A SIP OF WATER :              ALLOPURINOL (ZYLOPRIM)             AMIODARONE (PACERONE)             AMOXICILLIN (AMOXIL)             ISOSORBIDE (ISORDIL)             MECLIZINE (ANTIVERT) IF NEEDED             MIDODRINE (PROAMATINE)             NITROGLYCERINE IF NEEDED             PANTOPRAZOLE (PROTONIX)                Stop warfarin(coumadin) 5 days prior to surgery             7 days prior to surgery STOP taking any Aspirin (unless otherwise instructed by your surgeon), Aleve, Naproxen, Ibuprofen, Motrin, Advil, Goody's, BC's, all herbal medications, fish oil, and all vitamins.                  How to Manage Your Diabetes Before and After Surgery  Why is it important to control my blood sugar before and after surgery? . Improving blood sugar levels before and after surgery helps healing and can limit problems. . A way of improving blood sugar control is eating a healthy diet by: o  Eating less sugar and carbohydrates o  Increasing activity/exercise o  Talking with your doctor about reaching your blood sugar goals . High blood sugars (greater than 180 mg/dL) can raise your risk of infections and slow your recovery, so you will need to focus on controlling your diabetes during the weeks before surgery. . Make sure  that the doctor who takes care of your diabetes knows about your planned surgery including the date and location.  How do I manage my blood sugar before surgery? . Check your blood sugar at least 4 times a day, starting 2 days before surgery, to make sure that the level is not too high or low. o Check your blood sugar the morning of your surgery when you wake up and every 2 hours until you get to the Short Stay unit. . If your blood sugar is less than 70 mg/dL, you will need to treat for low blood sugar: o Do not take insulin. o Treat a low blood sugar (less than 70 mg/dL) with  cup of clear juice (cranberry or apple), 4 glucose tablets, OR glucose gel. Recheck blood sugar in 15 minutes after treatment (to make sure it is  greater than 70 mg/dL). If your blood sugar is not greater than 70 mg/dL on recheck, call [phone] o  for further instructions. . Report your blood sugar to the short stay nurse when you get to Short Stay.  . If you are admitted to the hospital after surgery: o Your blood sugar will be checked by the staff and you will probably be given insulin after surgery (instead of oral diabetes medicines) to make sure you have good blood sugar levels. o The goal for blood sugar control after surgery is 80-180 mg/dL     WHAT DO I DO ABOUT MY DIABETES MEDICATION?   Marland Kitchen Do not take oral diabetes medicines (pills) the morning of surgery.  . THE NIGHT BEFORE SURGERY, take ___________ units of ___________insulin.       . THE MORNING OF SURGERY, take _____________ units of __________insulin.  . The day of surgery, do not take other diabetes injectables, including Byetta (exenatide), Bydureon (exenatide ER), Victoza (liraglutide), or Trulicity (dulaglutide).  . If your CBG is greater than 220 mg/dL, you may take  of your sliding scale (correction) dose of insulin.  Other Instructions:        Do not wear jewelry.  Do not wear lotions, powders, or perfumes, or deodorant.  Do  not shave 48 hours prior to surgery.  Men may shave face and neck.  Do not bring valuables to the hospital.  [facility] is not responsible for any belongings or valuables.  Contacts, dentures or bridgework may not be worn into surgery.  Leave your suitcase in the car.  After surgery it may be brought to your room.  For patients admitted to the hospital, discharge time will be determined by your treatment team.  Patients discharged the day of surgery will not be allowed to drive home.    Special instructions:  [name]- Preparing For Surgery  Before surgery, you can play an important role. Because skin is not sterile, your skin needs to be as free of germs as possible. You can reduce the number of germs on your skin by washing with CHG (chlorahexidine gluconate) Soap before surgery.  CHG is an antiseptic cleaner which kills germs and bonds with the skin to continue killing germs even after washing.    Oral Hygiene is also important to reduce your risk of infection.  Remember - BRUSH YOUR TEETH THE MORNING OF SURGERY WITH YOUR REGULAR TOOTHPASTE  Please do not use if you have an allergy to CHG or antibacterial soaps. If your skin becomes reddened/irritated stop using the CHG.  Do not shave (including legs and underarms) for at least 48 hours prior to first CHG shower. It is OK to shave your face.  Please follow these instructions carefully.   1. Shower the NIGHT BEFORE SURGERY and the MORNING OF SURGERY with CHG.   2. If you chose to wash your hair, wash your hair first as usual with your normal shampoo.  3. After you shampoo, rinse your hair and body thoroughly to remove the shampoo.  4. Use CHG as you would any other liquid soap. You can apply CHG directly to the skin and wash gently with a scrungie or a clean washcloth.   5. Apply the CHG Soap to your body ONLY FROM THE NECK DOWN.  Do not use on open wounds or open sores. Avoid contact with your eyes, ears, mouth and genitals  (private parts). Wash Face and genitals (private parts)  with your normal soap.  6. Wash  thoroughly, paying special attention to the area where your surgery will be performed.  7. Thoroughly rinse your body with warm water from the neck down.  8. DO NOT shower/wash with your normal soap after using and rinsing off the CHG Soap.  9. Pat yourself dry with a CLEAN TOWEL.  10. Wear CLEAN PAJAMAS to bed the night before surgery, wear comfortable clothes the morning of surgery  11. Place CLEAN SHEETS on your bed the night of your first shower and DO NOT SLEEP WITH PETS.    Day of Surgery:  Do not apply any deodorants/lotions.  Please wear clean clothes to the hospital/surgery center.   Remember to brush your teeth WITH YOUR REGULAR TOOTHPASTE.    Please read over the following fact sheets that you were given. Coughing and Deep Breathing and Surgical Site Infection Prevention

## 2018-08-10 ENCOUNTER — Other Ambulatory Visit: Payer: Self-pay

## 2018-08-10 ENCOUNTER — Encounter: Payer: Self-pay | Admitting: Internal Medicine

## 2018-08-10 ENCOUNTER — Encounter (HOSPITAL_COMMUNITY)
Admission: RE | Admit: 2018-08-10 | Discharge: 2018-08-10 | Disposition: A | Discharge status: Home / Self Care | Payer: Medicare Other | Source: Ambulatory Visit | Attending: Surgery | Admitting: Surgery

## 2018-08-10 ENCOUNTER — Encounter (HOSPITAL_COMMUNITY): Payer: Self-pay

## 2018-08-10 DIAGNOSIS — Z01812 Encounter for preprocedural laboratory examination: Secondary | ICD-10-CM | POA: Diagnosis not present

## 2018-08-10 LAB — GLUCOSE, CAPILLARY: Glucose-Capillary: 101 mg/dL — ABNORMAL HIGH (ref 70–99)

## 2018-08-10 NOTE — Progress Notes (Signed)
PCP - Dr. Jossie Ng Cardiologist - Dr. Doylene Canard  Chest x-ray - 07/31/18 EKG - 07/31/18 Stress Test -  ECHO - 10/18 Cardiac Cath - 10/17  Sleep Study - denies  Fasting Blood Sugar - patient states he does not know his FBS Checks Blood Sugar OQD  Blood Thinner Instructions: pt to hold warfarin 5 days prior to surgery Aspirin Instructions: Patient instructed to hold all Aspirin, NSAID's, herbal medications, fish oil and vitamins 7 days prior to surgery.   Anesthesia review: cardiac history  Patient denies shortness of breath, fever, cough and chest pain at PAT appointment   Patient verbalized understanding of instructions that were given to them at the PAT appointment. Patient was also instructed that they will need to review over the PAT instructions again at home before surgery.

## 2018-08-11 DIAGNOSIS — E1129 Type 2 diabetes mellitus with other diabetic kidney complication: Secondary | ICD-10-CM | POA: Diagnosis not present

## 2018-08-11 DIAGNOSIS — N2581 Secondary hyperparathyroidism of renal origin: Secondary | ICD-10-CM | POA: Diagnosis not present

## 2018-08-11 DIAGNOSIS — N186 End stage renal disease: Secondary | ICD-10-CM | POA: Diagnosis not present

## 2018-08-11 DIAGNOSIS — D631 Anemia in chronic kidney disease: Secondary | ICD-10-CM | POA: Diagnosis not present

## 2018-08-11 NOTE — Anesthesia Preprocedure Evaluation (Addendum)
Anesthesia Evaluation  Patient identified by MRN, date of birth, ID band Patient awake    Reviewed: Allergy & Precautions, NPO status , Patient's Chart, lab work & pertinent test results  Airway Mallampati: II  TM Distance: >3 FB Neck ROM: Full    Dental  (+) Teeth Intact, Dental Advisory Given   Pulmonary    breath sounds clear to auscultation       Cardiovascular hypertension, + CAD, + Past MI and +CHF   Rhythm:Regular Rate:Normal     Neuro/Psych Depression CVA, No Residual Symptoms    GI/Hepatic Neg liver ROS, GERD  Medicated,  Endo/Other  diabetes  Renal/GU ESRF and DialysisRenal disease     Musculoskeletal negative musculoskeletal ROS (+)   Abdominal Normal abdominal exam  (+)   Peds  Hematology   Anesthesia Other Findings   Reproductive/Obstetrics                            Lab Results  Component Value Date   WBC 4.4 08/01/2018   HGB 11.2 (L) 08/15/2018   HCT 33.0 (L) 08/15/2018   MCV 98.9 08/01/2018   PLT 181 08/01/2018   Lab Results  Component Value Date   CREATININE 15.04 (H) 08/01/2018   BUN 68 (H) 08/01/2018   NA 135 08/15/2018   K 3.8 08/15/2018   CL 94 (L) 08/01/2018   CO2 21 (L) 08/01/2018   Echo: - Left ventricle: The cavity size was mildly dilated. Wall   thickness was increased in a pattern of mild LVH. Systolic   function was moderately to severely reduced. The estimated   ejection fraction was in the range of 30% to 35%. Diffuse   hypokinesis. Doppler parameters are consistent with abnormal left   ventricular relaxation (grade 1 diastolic dysfunction). - Aortic valve: There was trivial regurgitation. - Aortic root: The aortic root was mildly dilated. - Mitral valve: There was mild regurgitation.  Anesthesia Physical Anesthesia Plan  ASA: IV  Anesthesia Plan: General   Post-op Pain Management:    Induction: Intravenous  PONV Risk Score and Plan: 3  and Ondansetron, Dexamethasone and Midazolam  Airway Management Planned: Oral ETT  Additional Equipment: None  Intra-op Plan:   Post-operative Plan: Extubation in OR  Informed Consent: I have reviewed the patients History and Physical, chart, labs and discussed the procedure including the risks, benefits and alternatives for the proposed anesthesia with the patient or authorized representative who has indicated his/her understanding and acceptance.     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 Last seen by Dr. Doylene Canard during admission 06/20/18 for supratherapeutic INR and rectal bleeding. No cardiac testing ordered at that time. Echo 2018 showed EF 30-35%, mild MR, grade 1dd. Cath 2017 showed Prox LAD lesion, 25% stenosed, otherwise minimal nonobstructive CAD.  Pt's previous EKGs reviewed show stable lateral t-wave inversions. )      Anesthesia Quick Evaluation

## 2018-08-14 DIAGNOSIS — E1129 Type 2 diabetes mellitus with other diabetic kidney complication: Secondary | ICD-10-CM | POA: Diagnosis not present

## 2018-08-14 DIAGNOSIS — D631 Anemia in chronic kidney disease: Secondary | ICD-10-CM | POA: Diagnosis not present

## 2018-08-14 DIAGNOSIS — N186 End stage renal disease: Secondary | ICD-10-CM | POA: Diagnosis not present

## 2018-08-14 DIAGNOSIS — N2581 Secondary hyperparathyroidism of renal origin: Secondary | ICD-10-CM | POA: Diagnosis not present

## 2018-08-14 MED ORDER — BUPIVACAINE LIPOSOME 1.3 % IJ SUSP
20.0000 mL | INTRAMUSCULAR | Status: AC
  Administered 2018-08-15: 20 mL
  Filled 2018-08-14: qty 20

## 2018-08-14 MED ORDER — SODIUM CHLORIDE 0.9 % IV SOLN
2.0000 g | INTRAVENOUS | Status: AC
  Administered 2018-08-15: 2 g via INTRAVENOUS
  Filled 2018-08-14: qty 20

## 2018-08-14 MED ORDER — METRONIDAZOLE IN NACL 5-0.79 MG/ML-% IV SOLN
500.0000 mg | INTRAVENOUS | Status: AC
  Administered 2018-08-15: 500 mg via INTRAVENOUS
  Filled 2018-08-14: qty 100

## 2018-08-15 ENCOUNTER — Ambulatory Visit (HOSPITAL_COMMUNITY): Payer: Medicare Other | Admitting: Physician Assistant

## 2018-08-15 ENCOUNTER — Other Ambulatory Visit: Payer: Self-pay

## 2018-08-15 ENCOUNTER — Observation Stay (HOSPITAL_COMMUNITY)
Admission: RE | Admit: 2018-08-15 | Discharge: 2018-08-16 | Disposition: A | Discharge status: Home / Self Care | Payer: Medicare Other | Attending: Surgery | Admitting: Surgery

## 2018-08-15 ENCOUNTER — Encounter (HOSPITAL_COMMUNITY): Payer: Self-pay

## 2018-08-15 ENCOUNTER — Encounter (HOSPITAL_COMMUNITY)
Admission: RE | Disposition: A | Discharge status: Home / Self Care | Payer: Self-pay | Source: Home / Self Care | Attending: Surgery

## 2018-08-15 ENCOUNTER — Ambulatory Visit (HOSPITAL_COMMUNITY): Payer: Medicare Other | Admitting: Anesthesiology

## 2018-08-15 DIAGNOSIS — K642 Third degree hemorrhoids: Secondary | ICD-10-CM | POA: Diagnosis not present

## 2018-08-15 DIAGNOSIS — K640 First degree hemorrhoids: Secondary | ICD-10-CM | POA: Diagnosis not present

## 2018-08-15 DIAGNOSIS — M109 Gout, unspecified: Secondary | ICD-10-CM | POA: Insufficient documentation

## 2018-08-15 DIAGNOSIS — Z7901 Long term (current) use of anticoagulants: Secondary | ICD-10-CM | POA: Insufficient documentation

## 2018-08-15 DIAGNOSIS — Z86711 Personal history of pulmonary embolism: Secondary | ICD-10-CM | POA: Insufficient documentation

## 2018-08-15 DIAGNOSIS — Z905 Acquired absence of kidney: Secondary | ICD-10-CM

## 2018-08-15 DIAGNOSIS — I482 Chronic atrial fibrillation, unspecified: Secondary | ICD-10-CM | POA: Diagnosis present

## 2018-08-15 DIAGNOSIS — Z79899 Other long term (current) drug therapy: Secondary | ICD-10-CM | POA: Insufficient documentation

## 2018-08-15 DIAGNOSIS — K604 Rectal fistula: Secondary | ICD-10-CM | POA: Diagnosis not present

## 2018-08-15 DIAGNOSIS — I480 Paroxysmal atrial fibrillation: Secondary | ICD-10-CM | POA: Diagnosis present

## 2018-08-15 DIAGNOSIS — E119 Type 2 diabetes mellitus without complications: Secondary | ICD-10-CM

## 2018-08-15 DIAGNOSIS — N186 End stage renal disease: Secondary | ICD-10-CM

## 2018-08-15 DIAGNOSIS — Z8673 Personal history of transient ischemic attack (TIA), and cerebral infarction without residual deficits: Secondary | ICD-10-CM | POA: Diagnosis not present

## 2018-08-15 DIAGNOSIS — Z992 Dependence on renal dialysis: Secondary | ICD-10-CM | POA: Diagnosis not present

## 2018-08-15 DIAGNOSIS — Z85528 Personal history of other malignant neoplasm of kidney: Secondary | ICD-10-CM | POA: Diagnosis not present

## 2018-08-15 DIAGNOSIS — K219 Gastro-esophageal reflux disease without esophagitis: Secondary | ICD-10-CM | POA: Diagnosis not present

## 2018-08-15 DIAGNOSIS — I2510 Atherosclerotic heart disease of native coronary artery without angina pectoris: Secondary | ICD-10-CM | POA: Diagnosis not present

## 2018-08-15 DIAGNOSIS — K580 Irritable bowel syndrome with diarrhea: Secondary | ICD-10-CM | POA: Insufficient documentation

## 2018-08-15 DIAGNOSIS — E1122 Type 2 diabetes mellitus with diabetic chronic kidney disease: Secondary | ICD-10-CM | POA: Diagnosis not present

## 2018-08-15 DIAGNOSIS — K641 Second degree hemorrhoids: Secondary | ICD-10-CM | POA: Diagnosis not present

## 2018-08-15 DIAGNOSIS — I110 Hypertensive heart disease with heart failure: Secondary | ICD-10-CM | POA: Diagnosis not present

## 2018-08-15 DIAGNOSIS — K603 Anal fistula, unspecified: Secondary | ICD-10-CM | POA: Diagnosis present

## 2018-08-15 DIAGNOSIS — K644 Residual hemorrhoidal skin tags: Secondary | ICD-10-CM | POA: Insufficient documentation

## 2018-08-15 DIAGNOSIS — I5042 Chronic combined systolic (congestive) and diastolic (congestive) heart failure: Secondary | ICD-10-CM | POA: Insufficient documentation

## 2018-08-15 DIAGNOSIS — I2699 Other pulmonary embolism without acute cor pulmonale: Secondary | ICD-10-CM | POA: Diagnosis present

## 2018-08-15 DIAGNOSIS — Z79891 Long term (current) use of opiate analgesic: Secondary | ICD-10-CM | POA: Diagnosis not present

## 2018-08-15 DIAGNOSIS — I132 Hypertensive heart and chronic kidney disease with heart failure and with stage 5 chronic kidney disease, or end stage renal disease: Secondary | ICD-10-CM | POA: Insufficient documentation

## 2018-08-15 DIAGNOSIS — M1A9XX0 Chronic gout, unspecified, without tophus (tophi): Secondary | ICD-10-CM | POA: Diagnosis present

## 2018-08-15 DIAGNOSIS — I252 Old myocardial infarction: Secondary | ICD-10-CM | POA: Insufficient documentation

## 2018-08-15 HISTORY — DX: Other postprocedural complications and disorders of digestive system: K91.89

## 2018-08-15 HISTORY — PX: ANAL FISTULOTOMY: SHX6423

## 2018-08-15 HISTORY — DX: Other specified diseases of biliary tract: K83.8

## 2018-08-15 HISTORY — PX: EVALUATION UNDER ANESTHESIA WITH HEMORRHOIDECTOMY: SHX5624

## 2018-08-15 HISTORY — DX: Diverticulitis of intestine, part unspecified, without perforation or abscess without bleeding: K57.92

## 2018-08-15 HISTORY — DX: Other specified diseases of gallbladder: K82.8

## 2018-08-15 HISTORY — DX: Acute respiratory failure with hypoxia: J96.01

## 2018-08-15 HISTORY — PX: FISTULOTOMY: SHX6413

## 2018-08-15 HISTORY — DX: Other specified disorders of kidney and ureter: N28.89

## 2018-08-15 LAB — CBC
HCT: 37.1 % — ABNORMAL LOW (ref 39.0–52.0)
Hemoglobin: 11.8 g/dL — ABNORMAL LOW (ref 13.0–17.0)
MCH: 32.6 pg (ref 26.0–34.0)
MCHC: 31.8 g/dL (ref 30.0–36.0)
MCV: 102.5 fL — ABNORMAL HIGH (ref 80.0–100.0)
Platelets: 160 10*3/uL (ref 150–400)
RBC: 3.62 MIL/uL — ABNORMAL LOW (ref 4.22–5.81)
RDW: 16.9 % — ABNORMAL HIGH (ref 11.5–15.5)
WBC: 7.6 10*3/uL (ref 4.0–10.5)
nRBC: 0 % (ref 0.0–0.2)

## 2018-08-15 LAB — POCT I-STAT 4, (NA,K, GLUC, HGB,HCT)
Glucose, Bld: 81 mg/dL (ref 70–99)
HCT: 33 % — ABNORMAL LOW (ref 39.0–52.0)
Hemoglobin: 11.2 g/dL — ABNORMAL LOW (ref 13.0–17.0)
Potassium: 3.8 mmol/L (ref 3.5–5.1)
Sodium: 135 mmol/L (ref 135–145)

## 2018-08-15 LAB — GLUCOSE, CAPILLARY
Glucose-Capillary: 91 mg/dL (ref 70–99)
Glucose-Capillary: 91 mg/dL (ref 70–99)
Glucose-Capillary: 122 mg/dL — ABNORMAL HIGH (ref 70–99)

## 2018-08-15 LAB — PROTIME-INR
INR: 1.3 — ABNORMAL HIGH (ref 0.8–1.2)
Prothrombin Time: 16.2 seconds — ABNORMAL HIGH (ref 11.4–15.2)

## 2018-08-15 LAB — CREATININE, SERUM
Creatinine, Ser: 10.38 mg/dL — ABNORMAL HIGH (ref 0.61–1.24)
GFR calc Af Amer: 6 mL/min — ABNORMAL LOW (ref >60)
GFR calc non Af Amer: 5 mL/min — ABNORMAL LOW (ref >60)

## 2018-08-15 SURGERY — FISTULOTOMY
Anesthesia: General | Site: Rectum

## 2018-08-15 MED ORDER — HYDROMORPHONE HCL 1 MG/ML IJ SOLN
0.5000 mg | INTRAMUSCULAR | Status: DC | PRN
  Administered 2018-08-15: 1 mg via INTRAVENOUS
  Filled 2018-08-15: qty 1

## 2018-08-15 MED ORDER — FENTANYL CITRATE (PF) 250 MCG/5ML IJ SOLN
INTRAMUSCULAR | Status: AC
  Filled 2018-08-15: qty 5

## 2018-08-15 MED ORDER — MIDODRINE HCL 5 MG PO TABS: 10.0000 mg | ORAL_TABLET | ORAL | Status: DC

## 2018-08-15 MED ORDER — METOPROLOL TARTRATE 5 MG/5ML IV SOLN: 5.0000 mg | Freq: Four times a day (QID) | INTRAVENOUS | Status: DC | PRN

## 2018-08-15 MED ORDER — LOPERAMIDE HCL 2 MG PO CAPS
2.0000 mg | ORAL_CAPSULE | Freq: Every day | ORAL | Status: DC
  Administered 2018-08-15: 2 mg via ORAL
  Filled 2018-08-15: qty 1

## 2018-08-15 MED ORDER — PROCHLORPERAZINE EDISYLATE 10 MG/2ML IJ SOLN: 5.0000 mg | Freq: Four times a day (QID) | INTRAMUSCULAR | Status: DC | PRN

## 2018-08-15 MED ORDER — HYDROMORPHONE HCL 1 MG/ML IJ SOLN: 0.2500 mg | INTRAMUSCULAR | Status: DC | PRN

## 2018-08-15 MED ORDER — DIPHENHYDRAMINE HCL 50 MG/ML IJ SOLN: 12.5000 mg | Freq: Four times a day (QID) | INTRAMUSCULAR | Status: DC | PRN

## 2018-08-15 MED ORDER — MIDAZOLAM HCL 2 MG/2ML IJ SOLN
INTRAMUSCULAR | Status: DC | PRN
  Administered 2018-08-15: 1 mg via INTRAVENOUS

## 2018-08-15 MED ORDER — ONDANSETRON HCL 4 MG/2ML IJ SOLN
INTRAMUSCULAR | Status: AC
  Filled 2018-08-15: qty 2

## 2018-08-15 MED ORDER — ISOSORBIDE DINITRATE 20 MG PO TABS
20.0000 mg | ORAL_TABLET | Freq: Two times a day (BID) | ORAL | Status: DC
  Administered 2018-08-16: 20 mg via ORAL
  Filled 2018-08-15 (×2): qty 1

## 2018-08-15 MED ORDER — FENTANYL CITRATE (PF) 250 MCG/5ML IJ SOLN
INTRAMUSCULAR | Status: DC | PRN
  Administered 2018-08-15 (×2): 50 ug via INTRAVENOUS

## 2018-08-15 MED ORDER — PHENYLEPHRINE HCL 10 MG/ML IJ SOLN
INTRAMUSCULAR | Status: DC | PRN
  Administered 2018-08-15 (×2): 120 ug via INTRAVENOUS

## 2018-08-15 MED ORDER — ACETAMINOPHEN 500 MG PO TABS
1000.0000 mg | ORAL_TABLET | ORAL | Status: AC
  Administered 2018-08-15: 1000 mg via ORAL
  Filled 2018-08-15: qty 2

## 2018-08-15 MED ORDER — ACETAMINOPHEN 160 MG/5ML PO SOLN: 325.0000 mg | Freq: Once | ORAL | Status: DC

## 2018-08-15 MED ORDER — OXYCODONE HCL 5 MG PO TABS
5.0000 mg | ORAL_TABLET | ORAL | Status: DC | PRN
  Administered 2018-08-15: 5 mg via ORAL
  Administered 2018-08-16 (×2): 10 mg via ORAL
  Filled 2018-08-15 (×2): qty 2
  Filled 2018-08-15: qty 1

## 2018-08-15 MED ORDER — MIRTAZAPINE 15 MG PO TABS
7.5000 mg | ORAL_TABLET | Freq: Every day | ORAL | Status: DC
  Administered 2018-08-15: 7.5 mg via ORAL
  Filled 2018-08-15: qty 1

## 2018-08-15 MED ORDER — FERRIC CITRATE 1 GM 210 MG(FE) PO TABS
210.0000 mg | ORAL_TABLET | Freq: Three times a day (TID) | ORAL | Status: DC
  Administered 2018-08-15: 210 mg via ORAL
  Filled 2018-08-15 (×4): qty 1

## 2018-08-15 MED ORDER — MAGIC MOUTHWASH
15.0000 mL | Freq: Four times a day (QID) | ORAL | Status: DC | PRN
  Filled 2018-08-15: qty 15

## 2018-08-15 MED ORDER — HYDROCORTISONE 2.5 % RE CREA: 1.0000 "application " | TOPICAL_CREAM | Freq: Four times a day (QID) | RECTAL | Status: DC | PRN

## 2018-08-15 MED ORDER — PANTOPRAZOLE SODIUM 40 MG PO TBEC
40.0000 mg | DELAYED_RELEASE_TABLET | Freq: Two times a day (BID) | ORAL | Status: DC
  Administered 2018-08-15 – 2018-08-16 (×2): 40 mg via ORAL
  Filled 2018-08-15 (×2): qty 1

## 2018-08-15 MED ORDER — ACETAMINOPHEN 10 MG/ML IV SOLN
1000.0000 mg | Freq: Once | INTRAVENOUS | Status: DC | PRN
  Administered 2018-08-15: 1000 mg via INTRAVENOUS

## 2018-08-15 MED ORDER — SODIUM CHLORIDE 0.9% FLUSH: 3.0000 mL | INTRAVENOUS | Status: DC | PRN

## 2018-08-15 MED ORDER — METHOCARBAMOL 750 MG PO TABS
750.0000 mg | ORAL_TABLET | Freq: Four times a day (QID) | ORAL | Status: DC | PRN
  Administered 2018-08-15 – 2018-08-16 (×2): 750 mg via ORAL
  Filled 2018-08-15 (×2): qty 1

## 2018-08-15 MED ORDER — SIMETHICONE 80 MG PO CHEW: 40.0000 mg | CHEWABLE_TABLET | Freq: Four times a day (QID) | ORAL | Status: DC | PRN

## 2018-08-15 MED ORDER — ACETAMINOPHEN 500 MG PO TABS
1000.0000 mg | ORAL_TABLET | Freq: Three times a day (TID) | ORAL | Status: DC
  Administered 2018-08-15 – 2018-08-16 (×2): 1000 mg via ORAL
  Filled 2018-08-15 (×3): qty 2

## 2018-08-15 MED ORDER — CEFAZOLIN SODIUM-DEXTROSE 2-4 GM/100ML-% IV SOLN: 2.0000 g | Freq: Three times a day (TID) | INTRAVENOUS | Status: DC

## 2018-08-15 MED ORDER — CALCIUM ACETATE (PHOS BINDER) 667 MG PO CAPS
2001.0000 mg | ORAL_CAPSULE | Freq: Three times a day (TID) | ORAL | Status: DC
  Administered 2018-08-15: 2001 mg via ORAL
  Filled 2018-08-15 (×2): qty 3

## 2018-08-15 MED ORDER — PHENYLEPHRINE 40 MCG/ML (10ML) SYRINGE FOR IV PUSH (FOR BLOOD PRESSURE SUPPORT)
PREFILLED_SYRINGE | INTRAVENOUS | Status: AC
  Filled 2018-08-15: qty 10

## 2018-08-15 MED ORDER — CHLORHEXIDINE GLUCONATE CLOTH 2 % EX PADS: 6.0000 | MEDICATED_PAD | Freq: Once | CUTANEOUS | Status: DC

## 2018-08-15 MED ORDER — MIDAZOLAM HCL 2 MG/2ML IJ SOLN
INTRAMUSCULAR | Status: AC
  Filled 2018-08-15: qty 2

## 2018-08-15 MED ORDER — DIBUCAINE 1 % RE OINT
TOPICAL_OINTMENT | RECTAL | Status: AC
  Filled 2018-08-15: qty 28

## 2018-08-15 MED ORDER — LACTATED RINGERS IV SOLN: INTRAVENOUS | Status: DC

## 2018-08-15 MED ORDER — HYDRALAZINE HCL 20 MG/ML IJ SOLN: 5.0000 mg | INTRAMUSCULAR | Status: DC | PRN

## 2018-08-15 MED ORDER — GABAPENTIN 300 MG PO CAPS
300.0000 mg | ORAL_CAPSULE | Freq: Two times a day (BID) | ORAL | Status: DC
  Administered 2018-08-15 – 2018-08-16 (×2): 300 mg via ORAL
  Filled 2018-08-15 (×2): qty 1

## 2018-08-15 MED ORDER — LIDOCAINE 2% (20 MG/ML) 5 ML SYRINGE
INTRAMUSCULAR | Status: DC | PRN
  Administered 2018-08-15: 40 mg via INTRAVENOUS

## 2018-08-15 MED ORDER — SUCCINYLCHOLINE CHLORIDE 200 MG/10ML IV SOSY
PREFILLED_SYRINGE | INTRAVENOUS | Status: AC
  Filled 2018-08-15: qty 10

## 2018-08-15 MED ORDER — BISACODYL 10 MG RE SUPP: 10.0000 mg | Freq: Every day | RECTAL | Status: DC | PRN

## 2018-08-15 MED ORDER — EPHEDRINE SULFATE 50 MG/ML IJ SOLN
INTRAMUSCULAR | Status: DC | PRN
  Administered 2018-08-15: 10 mg via INTRAVENOUS

## 2018-08-15 MED ORDER — SODIUM CHLORIDE 0.9 % IV SOLN
INTRAVENOUS | Status: DC | PRN
  Administered 2018-08-15: 50 ug/min via INTRAVENOUS

## 2018-08-15 MED ORDER — DIPHENHYDRAMINE HCL 12.5 MG/5ML PO ELIX: 12.5000 mg | ORAL_SOLUTION | Freq: Four times a day (QID) | ORAL | Status: DC | PRN

## 2018-08-15 MED ORDER — SUGAMMADEX SODIUM 200 MG/2ML IV SOLN
INTRAVENOUS | Status: DC | PRN
  Administered 2018-08-15: 217.4 mg via INTRAVENOUS

## 2018-08-15 MED ORDER — MIDODRINE HCL 5 MG PO TABS
15.0000 mg | ORAL_TABLET | ORAL | Status: DC
  Administered 2018-08-16: 15 mg via ORAL
  Filled 2018-08-15: qty 3

## 2018-08-15 MED ORDER — MEPERIDINE HCL 50 MG/ML IJ SOLN: 6.2500 mg | INTRAMUSCULAR | Status: DC | PRN

## 2018-08-15 MED ORDER — ALUM & MAG HYDROXIDE-SIMETH 200-200-20 MG/5ML PO SUSP: 30.0000 mL | Freq: Four times a day (QID) | ORAL | Status: DC | PRN

## 2018-08-15 MED ORDER — ALLOPURINOL 300 MG PO TABS
150.0000 mg | ORAL_TABLET | ORAL | Status: DC
  Administered 2018-08-15: 150 mg via ORAL
  Filled 2018-08-15: qty 1

## 2018-08-15 MED ORDER — RENA-VITE PO TABS
1.0000 | ORAL_TABLET | Freq: Every day | ORAL | Status: DC
  Administered 2018-08-15: 1 via ORAL
  Filled 2018-08-15: qty 1

## 2018-08-15 MED ORDER — ACETAMINOPHEN 10 MG/ML IV SOLN
INTRAVENOUS | Status: AC
  Filled 2018-08-15: qty 100

## 2018-08-15 MED ORDER — ROCURONIUM BROMIDE 50 MG/5ML IV SOSY
PREFILLED_SYRINGE | INTRAVENOUS | Status: DC | PRN
  Administered 2018-08-15: 50 mg via INTRAVENOUS

## 2018-08-15 MED ORDER — CALCIUM ACETATE (PHOS BINDER) 667 MG PO CAPS: 667.0000 mg | ORAL_CAPSULE | ORAL | Status: DC | PRN

## 2018-08-15 MED ORDER — DEXAMETHASONE SODIUM PHOSPHATE 10 MG/ML IJ SOLN
INTRAMUSCULAR | Status: DC | PRN
  Administered 2018-08-15: 5 mg via INTRAVENOUS

## 2018-08-15 MED ORDER — LIP MEDEX EX OINT
1.0000 "application " | TOPICAL_OINTMENT | Freq: Two times a day (BID) | CUTANEOUS | Status: DC
  Administered 2018-08-15 – 2018-08-16 (×2): 1 via TOPICAL
  Filled 2018-08-15: qty 7

## 2018-08-15 MED ORDER — SODIUM CHLORIDE 0.9% FLUSH
3.0000 mL | Freq: Two times a day (BID) | INTRAVENOUS | Status: DC
  Administered 2018-08-16: 3 mL via INTRAVENOUS

## 2018-08-15 MED ORDER — NITROGLYCERIN 0.4 MG SL SUBL: 0.4000 mg | SUBLINGUAL_TABLET | SUBLINGUAL | Status: DC | PRN

## 2018-08-15 MED ORDER — PRO-STAT SUGAR FREE PO LIQD
30.0000 mL | Freq: Two times a day (BID) | ORAL | Status: DC
  Filled 2018-08-15 (×2): qty 30

## 2018-08-15 MED ORDER — DEXAMETHASONE SODIUM PHOSPHATE 10 MG/ML IJ SOLN
INTRAMUSCULAR | Status: AC
  Filled 2018-08-15: qty 1

## 2018-08-15 MED ORDER — MENTHOL 3 MG MT LOZG: 1.0000 | LOZENGE | OROMUCOSAL | Status: DC | PRN

## 2018-08-15 MED ORDER — DOXERCALCIFEROL 4 MCG/2ML IV SOLN: 3.0000 ug | INTRAVENOUS | Status: DC

## 2018-08-15 MED ORDER — ROCURONIUM BROMIDE 50 MG/5ML IV SOSY
PREFILLED_SYRINGE | INTRAVENOUS | Status: AC
  Filled 2018-08-15: qty 5

## 2018-08-15 MED ORDER — LIDOCAINE 2% (20 MG/ML) 5 ML SYRINGE
INTRAMUSCULAR | Status: AC
  Filled 2018-08-15: qty 5

## 2018-08-15 MED ORDER — ACETAMINOPHEN 325 MG PO TABS: 325.0000 mg | ORAL_TABLET | Freq: Once | ORAL | Status: DC

## 2018-08-15 MED ORDER — BUPIVACAINE-EPINEPHRINE (PF) 0.25% -1:200000 IJ SOLN
INTRAMUSCULAR | Status: AC
  Filled 2018-08-15: qty 30

## 2018-08-15 MED ORDER — METHYLENE BLUE 0.5 % INJ SOLN
INTRAVENOUS | Status: AC
  Filled 2018-08-15: qty 10

## 2018-08-15 MED ORDER — PHENOL 1.4 % MT LIQD: 1.0000 | OROMUCOSAL | Status: DC | PRN

## 2018-08-15 MED ORDER — AMIODARONE HCL 200 MG PO TABS
100.0000 mg | ORAL_TABLET | Freq: Every day | ORAL | Status: DC
  Administered 2018-08-15 – 2018-08-16 (×2): 100 mg via ORAL
  Filled 2018-08-15 (×2): qty 1

## 2018-08-15 MED ORDER — PROPOFOL 10 MG/ML IV BOLUS
INTRAVENOUS | Status: DC | PRN
  Administered 2018-08-15: 100 mg via INTRAVENOUS

## 2018-08-15 MED ORDER — PROCHLORPERAZINE MALEATE 10 MG PO TABS
10.0000 mg | ORAL_TABLET | Freq: Four times a day (QID) | ORAL | Status: DC | PRN
  Filled 2018-08-15: qty 1

## 2018-08-15 MED ORDER — GABAPENTIN 300 MG PO CAPS
300.0000 mg | ORAL_CAPSULE | ORAL | Status: AC
  Administered 2018-08-15: 300 mg via ORAL
  Filled 2018-08-15: qty 1

## 2018-08-15 MED ORDER — DOXERCALCIFEROL 4 MCG/2ML IV SOLN
4.0000 ug | INTRAVENOUS | Status: DC
  Administered 2018-08-16: 4 ug via INTRAVENOUS
  Filled 2018-08-15: qty 2

## 2018-08-15 MED ORDER — HEPARIN SODIUM (PORCINE) 5000 UNIT/ML IJ SOLN
5000.0000 [IU] | Freq: Three times a day (TID) | INTRAMUSCULAR | Status: DC
  Administered 2018-08-15 – 2018-08-16 (×2): 5000 [IU] via SUBCUTANEOUS
  Filled 2018-08-15 (×2): qty 1

## 2018-08-15 MED ORDER — EPHEDRINE 5 MG/ML INJ
INTRAVENOUS | Status: AC
  Filled 2018-08-15: qty 10

## 2018-08-15 MED ORDER — CHLORHEXIDINE GLUCONATE CLOTH 2 % EX PADS
6.0000 | MEDICATED_PAD | Freq: Every day | CUTANEOUS | Status: DC
  Administered 2018-08-16: 6 via TOPICAL

## 2018-08-15 MED ORDER — LIDOCAINE-PRILOCAINE 2.5-2.5 % EX CREA: 1.0000 "application " | TOPICAL_CREAM | CUTANEOUS | Status: DC

## 2018-08-15 MED ORDER — CALCIUM ACETATE (PHOS BINDER) 667 MG PO CAPS: 667.0000 mg | ORAL_CAPSULE | ORAL | Status: DC

## 2018-08-15 MED ORDER — METRONIDAZOLE IN NACL 5-0.79 MG/ML-% IV SOLN
500.0000 mg | Freq: Three times a day (TID) | INTRAVENOUS | Status: AC
  Administered 2018-08-15: 500 mg via INTRAVENOUS
  Filled 2018-08-15: qty 100

## 2018-08-15 MED ORDER — BUPIVACAINE-EPINEPHRINE 0.25% -1:200000 IJ SOLN
INTRAMUSCULAR | Status: DC | PRN
  Administered 2018-08-15: 20 mL

## 2018-08-15 MED ORDER — MECLIZINE HCL 25 MG PO TABS
25.0000 mg | ORAL_TABLET | Freq: Three times a day (TID) | ORAL | Status: DC | PRN
  Filled 2018-08-15: qty 1

## 2018-08-15 MED ORDER — ONDANSETRON 4 MG PO TBDP: 4.0000 mg | ORAL_TABLET | Freq: Four times a day (QID) | ORAL | Status: DC | PRN

## 2018-08-15 MED ORDER — SODIUM CHLORIDE 0.9 % IV SOLN: 250.0000 mL | INTRAVENOUS | Status: DC | PRN

## 2018-08-15 MED ORDER — SODIUM CHLORIDE 0.9 % IV SOLN
INTRAVENOUS | Status: DC
  Administered 2018-08-15 (×2): via INTRAVENOUS

## 2018-08-15 MED ORDER — ONDANSETRON HCL 4 MG/2ML IJ SOLN
INTRAMUSCULAR | Status: DC | PRN
  Administered 2018-08-15: 4 mg via INTRAVENOUS

## 2018-08-15 MED ORDER — PROMETHAZINE HCL 25 MG/ML IJ SOLN: 6.2500 mg | INTRAMUSCULAR | Status: DC | PRN

## 2018-08-15 MED ORDER — ALBUMIN HUMAN 5 % IV SOLN
12.5000 g | Freq: Four times a day (QID) | INTRAVENOUS | Status: DC | PRN
  Filled 2018-08-15: qty 250

## 2018-08-15 MED ORDER — POLYETHYLENE GLYCOL 3350 17 G PO PACK: 17.0000 g | PACK | Freq: Every day | ORAL | Status: DC | PRN

## 2018-08-15 MED ORDER — HYDROCORTISONE 1 % EX CREA: 1.0000 "application " | TOPICAL_CREAM | Freq: Three times a day (TID) | CUTANEOUS | Status: DC | PRN

## 2018-08-15 MED ORDER — LOPERAMIDE HCL 2 MG PO CAPS: 2.0000 mg | ORAL_CAPSULE | Freq: Three times a day (TID) | ORAL | Status: DC | PRN

## 2018-08-15 MED ORDER — ONDANSETRON HCL 4 MG/2ML IJ SOLN: 4.0000 mg | Freq: Four times a day (QID) | INTRAMUSCULAR | Status: DC | PRN

## 2018-08-15 MED ORDER — HYALURONIDASE HUMAN 150 UNIT/ML IJ SOLN
INTRAMUSCULAR | Status: AC
  Filled 2018-08-15: qty 1

## 2018-08-15 MED ORDER — GUAIFENESIN-DM 100-10 MG/5ML PO SYRP: 10.0000 mL | ORAL_SOLUTION | ORAL | Status: DC | PRN

## 2018-08-15 MED ORDER — 0.9 % SODIUM CHLORIDE (POUR BTL) OPTIME
TOPICAL | Status: DC | PRN
  Administered 2018-08-15: 1000 mL

## 2018-08-15 MED ORDER — OXYCODONE HCL 5 MG PO TABS: 5.0000 mg | ORAL_TABLET | Freq: Four times a day (QID) | ORAL | 0 refills | Status: DC | PRN

## 2018-08-15 SURGICAL SUPPLY — 51 items
CANISTER SUCT 3000ML PPV (MISCELLANEOUS) ×3 IMPLANT
COVER BACK TABLE 60X90IN (DRAPES) ×3 IMPLANT
COVER SURGICAL LIGHT HANDLE (MISCELLANEOUS) ×3 IMPLANT
COVER WAND RF STERILE (DRAPES) ×3 IMPLANT
DECANTER SPIKE VIAL GLASS SM (MISCELLANEOUS) ×3 IMPLANT
DRAPE HALF SHEET 40X57 (DRAPES) ×3 IMPLANT
DRAPE LAPAROTOMY T 102X78X121 (DRAPES) ×3 IMPLANT
DRSG PAD ABDOMINAL 8X10 ST (GAUZE/BANDAGES/DRESSINGS) ×2 IMPLANT
ELECT CAUTERY BLADE 6.4 (BLADE) ×3 IMPLANT
ELECT REM PT RETURN 9FT ADLT (ELECTROSURGICAL) ×3
ELECTRODE REM PT RTRN 9FT ADLT (ELECTROSURGICAL) ×2 IMPLANT
GAUZE 4X4 16PLY RFD (DISPOSABLE) ×3 IMPLANT
GAUZE SPONGE 4X4 12PLY STRL (GAUZE/BANDAGES/DRESSINGS) ×3 IMPLANT
GLOVE BIO SURGEON STRL SZ8 (GLOVE) ×3 IMPLANT
GLOVE ECLIPSE 8.0 STRL XLNG CF (GLOVE) ×3 IMPLANT
GLOVE INDICATOR 8.0 STRL GRN (GLOVE) ×3 IMPLANT
GOWN STRL REUS W/ TWL LRG LVL3 (GOWN DISPOSABLE) ×4 IMPLANT
GOWN STRL REUS W/ TWL XL LVL3 (GOWN DISPOSABLE) ×2 IMPLANT
GOWN STRL REUS W/TWL LRG LVL3 (GOWN DISPOSABLE) ×6
GOWN STRL REUS W/TWL XL LVL3 (GOWN DISPOSABLE) ×3
KIT BASIN OR (CUSTOM PROCEDURE TRAY) ×3 IMPLANT
KIT TURNOVER KIT B (KITS) ×3 IMPLANT
NDL 18GX1X1/2 (RX/OR ONLY) (NEEDLE) ×1 IMPLANT
NDL BLUNT 16X1.5 OR ONLY (NEEDLE) IMPLANT
NDL HYPO 25GX1X1/2 BEV (NEEDLE) ×1 IMPLANT
NEEDLE 18GX1X1/2 (RX/OR ONLY) (NEEDLE) ×3 IMPLANT
NEEDLE 22X1 1/2 (OR ONLY) (NEEDLE) ×3 IMPLANT
NEEDLE BLUNT 16X1.5 OR ONLY (NEEDLE) IMPLANT
NEEDLE HYPO 25GX1X1/2 BEV (NEEDLE) ×3 IMPLANT
NS IRRIG 1000ML POUR BTL (IV SOLUTION) ×3 IMPLANT
PACK LITHOTOMY IV (CUSTOM PROCEDURE TRAY) ×3 IMPLANT
PACK SURGICAL SETUP 50X90 (CUSTOM PROCEDURE TRAY) ×3 IMPLANT
PAD ARMBOARD 7.5X6 YLW CONV (MISCELLANEOUS) ×6 IMPLANT
PENCIL BUTTON HOLSTER BLD 10FT (ELECTRODE) ×3 IMPLANT
SPECIMEN JAR SMALL (MISCELLANEOUS) ×3 IMPLANT
SPONGE SURGIFOAM ABS GEL 12-7 (HEMOSTASIS) IMPLANT
SURGILUBE 2OZ TUBE FLIPTOP (MISCELLANEOUS) ×3 IMPLANT
SUT CHROMIC 2 0 SH (SUTURE) ×3 IMPLANT
SUT CHROMIC 3 0 SH 27 (SUTURE) ×3 IMPLANT
SUT VIC AB 2-0 SH 27 (SUTURE)
SUT VIC AB 2-0 SH 27X BRD (SUTURE) IMPLANT
SUT VIC AB 2-0 UR6 27 (SUTURE) ×12 IMPLANT
SUT VIC AB 4-0 SH 18 (SUTURE) IMPLANT
SYR BULB IRRIGATION 50ML (SYRINGE) ×2 IMPLANT
SYR CONTROL 10ML LL (SYRINGE) ×3 IMPLANT
TOWEL NATURAL 10PK STERILE (DISPOSABLE) ×3 IMPLANT
TOWEL OR 17X24 6PK STRL BLUE (TOWEL DISPOSABLE) ×3 IMPLANT
TOWEL OR 17X26 10 PK STRL BLUE (TOWEL DISPOSABLE) ×3 IMPLANT
TRAY PROCTOSCOPIC FIBER OPTIC (SET/KITS/TRAYS/PACK) ×3 IMPLANT
TUBE CONNECTING 12X1/4 (SUCTIONS) ×3 IMPLANT
YANKAUER SUCT BULB TIP NO VENT (SUCTIONS) ×3 IMPLANT

## 2018-08-15 NOTE — Op Note (Signed)
08/15/2018  2:28 PM  PATIENT:  [name]  58 y.o. 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  See operative record  Delay start of Pharmacological VTE agent (>24hrs) due to surgical blood loss or risk of bleeding:  NO  DRAINS: NONE  SPECIMEN:   External hemorrhoid with anal fistulous tract  DISPOSITION OF SPECIMEN:  PATHOLOGY  COUNTS:  YES  PLAN OF CARE: Discharge home after PACU  PATIENT DISPOSITION:  PACU - hemodynamically stable.  INDICATION: Pleasant patient on hemodialysis status post nephrectomy and progressive renal failure.  Chronic atrial fibrillation on anticoagulation.  IBS with diarrhea.  Severe anal pain.  Suspicion for fissure.  Bleeding hemorrhoids with discomfort.  Severe pain with recurrent infection on chronic antibiotics.  I felt the patient would benefit from examination under anesthesia with possible fistula repair versus fistulotomy versus seton.  Possible hemorrhoidectomy or ligation.  Not able to be managed in the office despite an improved bowel regimen.  I recommended examination under anesthesia and surgical treatment:  The anatomy & physiology of the anorectal region was  discussed.  We discussed the pathophysiology of anorectal abscess and fistula.  Differential diagnosis was discussed.  Natural history progression was discussed.   I stressed the importance of a bowel regimen to have daily soft bowel movements to minimize progression of disease.     The patient's condition is not adequately controlled.  Non-operative treatment has not healed the fistula.  Therefore, I recommended examination under anaesthesia to confirm the diagnosis and treat the fistula.  I discussed techniques that may be required such as fistulotomy, ligation by LIFT technique, and/or seton placement.  Benefits & alternatives discussed.  I noted a good likelihood this will help address the problem, but sometimes repeat operations and prolonged healing times may occur.  Risks such as bleeding, pain, recurrence, reoperation, injury to other organs, need for repair of tissues / organs reoperation, incontinence, heart attack, death, and other risks were discussed.      Educational handouts further explaining the pathology, treatment options, and bowel regimen were given.  The patient expressed understanding & wishes to proceed.  We will work to coordinate surgery for a mutually convenient time.    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  Fistulotomy done and excision of surrounding external hemorrhoidal tissue.  Grade 2/3 internal hemorrhoids.  6 column hemorrhoidal ligation/pexy done.  DESCRIPTION:   Informed consent was confirmed. Patient underwent general anesthesia without difficulty. Patient was placed into prone positioning.  The perianal region was prepped and draped in sterile fashion. Surgical time-out confirmed our plan.  I did digital rectal examination and then transitioned over to anoscopy to get a sense of the anatomy.  Findings noted above.   I was able to place the fistulous probe through the external opening of the fistula.  Found an opening at the  anal verge distal to the crypts.  Superficial to the anal complex.  I confirmed that sphincter was proximal and deep to the entire fistulous tract.  Therefore decided to do fistulotomy.  Excised through the skin to open up.  Excised the chronically inflamed and irritated external hemorrhoid tissue on both sides to have a more open flat wound.  Hemostasis ensured.  No undrained abscess collection.  No other fistula noted.  Still with significant inflamed hemorrhoids with partial prolapse right posterior and left lateral piles.  I proceeded to do hemorrhoidal ligation and pexy.  I used a 2-0 Vicryl suture on a UR-6 needle in a figure-of-eight fashion 6 cm proximal to the anal verge.  I started at the right posterior hemorrhoidal pile close to the fistulotomy.   I then ran that stitch longitudinally more distally to close the hemorrhoidectomy wound to the anal verge over a Parks self retaining retractor & occasionally a large Hill-Furgeson retractor to avoid narrowing of the anal canal.  I then tied that stitch down to cause a hemorrhoidopexy.   I then did hemorrhoidal ligation and pexy at the other 4 columns.  At the completion of this, all 6 anorectal columns were ligated and pexied in the classic hexagonal fashion (right anterior/lateral/posterior, left anterior/lateral/posterior).  I closed the external part of the hemorrhoidectomy wounds with interrupted horizontal mattress 2-0 chromic suture, leaving the last 5 mm open to allow natural drainage.    I redid anoscopy & examination.  At completion of this, all hemorrhoids had been removed or reduced into the rectum.  There is no more prolapse.  3 x 3 cm perianal fistulotomy wound open and flat.  Hemostasis ensured.  Internal & external anatomy was more more normal.  Hemostasis was good.  Fluffed gauze was on-laid over the perianal region.  No packing done.  Patient is being extubated go to go to the recovery room.  I had discussed postop care in detail with  the patient in the preop holding area.  Instructions for post-operative recovery and prescriptions are written. I discussed operative findings, updated the patient's status, discussed probable steps to recovery, and gave postoperative recommendations to the patient's spouse.  Was able to reach her on her mobile phone.  [phone].  Recommendations were made.  Questions were answered.  She expressed understanding & appreciation.  Adin Hector, M.D., F.A.C.S. Gastrointestinal and Minimally Invasive Surgery [facility] [address] [place], [place] [address] [phone] Main / Paging

## 2018-08-15 NOTE — Anesthesia Procedure Notes (Signed)
Procedure Name: Intubation Date/Time: 08/15/2018 1:21 PM Performed by: Kathryne Hitch, CRNA Pre-anesthesia Checklist: Patient identified, Emergency Drugs available, Suction available, Patient being monitored and Timeout performed Patient Re-evaluated:Patient Re-evaluated prior to induction Oxygen Delivery Method: Circle system utilized Preoxygenation: Pre-oxygenation with 100% oxygen Induction Type: IV induction Laryngoscope Size: Miller and 2 Grade View: Grade I Tube type: Oral Tube size: 7.5 mm Number of attempts: 1 Airway Equipment and Method: Stylet and Oral airway Placement Confirmation: ETT inserted through vocal cords under direct vision,  positive ETCO2 and breath sounds checked- equal and bilateral Secured at: 23 cm Tube secured with: Tape Dental Injury: Teeth and Oropharynx as per pre-operative assessment

## 2018-08-15 NOTE — Anesthesia Postprocedure Evaluation (Signed)
Anesthesia Post Note  Patient: [name]  Procedure(s) Performed: SUPERFICIAL ANAL FISTULOTOMY (N/A Rectum) HEMORRHOID LIGATION/PEXY (N/A Rectum)     Patient location during evaluation: PACU Anesthesia Type: General Level of consciousness: awake and alert Pain management: pain level controlled Vital Signs Assessment: post-procedure vital signs reviewed and stable Respiratory status: spontaneous breathing, nonlabored ventilation, respiratory function stable and patient connected to nasal cannula oxygen Cardiovascular status: blood pressure returned to baseline and stable Postop Assessment: no apparent nausea or vomiting Anesthetic complications: no    Last Vitals:  Vitals:   08/15/18 1525 08/15/18 1605  BP:  99/67  Pulse:  77  Resp: 20 15  Temp: 36.6 C 36.4 C  SpO2: 99% 92%    Last Pain:  Vitals:   08/15/18 1605  TempSrc: Oral  PainSc:                  Effie Berkshire

## 2018-08-15 NOTE — Progress Notes (Signed)
Received pt from PACU. Patient very drowsy but arousable. Dressing on buttocks intact. Explained to patient use of the call bell. Will continue to monitor.

## 2018-08-15 NOTE — Discharge Instructions (Signed)
ANORECTAL SURGERY:  °POST OPERATIVE INSTRUCTIONS ° °###################################################################### ° °EAT °Start with a pureed / full liquid diet °After 24 hours, gradually transition to a high fiber diet.   ° °CONTROL PAIN °Control pain so you can tolerate bowel movements,  °walk, sleep, tolerate sneezing/coughing, and go up/down stairs. ° ° °HAVE A BOWEL MOVEMENT DAILY °Keep your bowels regular to avoid problems.   °Taking a fiber supplement every day to keep bowels soft.   °Try a laxative to override constipation. °Use an antidairrheal to slow down diarrhea.   °Call if not better after 2 tries ° °WALK °Walk an hour a day.  Control your pain to do that. °  °CALL IF YOU HAVE PROBLEMS/CONCERNS °Call if you are still struggling despite following these instructions. °Call if you have concerns not answered by these instructions ° °###################################################################### ° ° ° °1. Take your usually prescribed home medications unless otherwise directed. °2. DIET: Follow a light bland diet the first 24 hours after arrival home, such as soup, liquids, crackers, etc.  Be sure to include lots of fluids daily.  Avoid fast food or heavy meals as your are more likely to get nauseated.  Eat a low fat the next few days after surgery.   °3. PAIN CONTROL: °a. Pain is best controlled by a usual combination of three different methods TOGETHER: °i. Ice/Heat °ii. Over the counter pain medication °iii. Prescription pain medication °b. Expect swelling and discomfort in the anus/rectal area.  Warm water baths (30-60 minutes up to 6 times a day, especially after bowel meovements) will help. Use ice for the first few days to help decrease swelling and bruising, then switch to heat such as warm towels, sitz baths, warm baths, etc to help relax tight/sore spots and speed recovery.  Some people prefer to use ice alone, heat alone, alternating between ice & heat.  Experiment to what works  for you.   °c. It is helpful to take an over-the-counter pain medication continuously for the first few weeks.  Choose one of the following that works best for you: °i. Naproxen (Aleve, etc)  Two 220mg tabs twice a day °ii. Ibuprofen (Advil, etc) Three 200mg tabs four times a day (every meal & bedtime) °iii. Acetaminophen (Tylenol, etc) 500-650mg four times a day (every meal & bedtime) °d. A  prescription for pain medication (such as oxycodone, hydrocodone, etc) should be given to you upon discharge.  Take your pain medication as prescribed.  °i. If you are having problems/concerns with the prescription medicine (does not control pain, nausea, vomiting, rash, itching, etc), please call us [phone] to see if we need to switch you to a different pain medicine that will work better for you and/or control your side effect better. °ii. If you need a refill on your pain medication, please contact your pharmacy.  They will contact our office to request authorization. Prescriptions will not be filled after 5 pm or on week-ends.  If can take up to 48 hours for it to be filled & ready so avoid waiting until you are down to thel ast pill. °e. A topical cream (Dibucaine) or a prescription for a cream (such as diltiazem 2% gel) may be given to you.  Many people find relief with topical creams.  Some people find it burns too much.  Experiment.  If it helps, use it.  If it burns, don't using it. ° °Use a Sitz Bath 4-8 times a day for relief ° ° °Sitz Bath °A sitz bath   is a warm water bath taken in the sitting position that covers only the hips and buttocks. It may be used for either healing or hygiene purposes. Sitz baths are also used to relieve pain, itching, or muscle spasms. The water may contain medicine. Moist heat will help you heal and relax.  °HOME CARE INSTRUCTIONS  °Take 3 to 4 sitz baths a day. °1. Fill the bathtub half full with warm water. °2. Sit in the water and open the drain a little. °3. Turn on the warm  water to keep the tub half full. Keep the water running constantly. °4. Soak in the water for 15 to 20 minutes. °5. After the sitz bath, pat the affected area dry first. ° ° °4. KEEP YOUR BOWELS REGULAR °a. The goal is one soft bowel movement a day °b. Avoid getting constipated.  Between the surgery and the pain medications, it is common to experience some constipation.  Increasing fluid intake and taking a fiber supplement (such as Metamucil, Citrucel, FiberCon, MiraLax, etc) 2-3 times a day regularly will usually help prevent this problem from occurring.  A mild laxative (prune juice, Milk of Magnesia, MiraLax, etc) should be taken according to package directions if there are no bowel movements after 48 hours. °c. Watch out for diarrhea.  If you have many loose bowel movements, simplify your diet to bland foods & liquids for a few days.  Stop any stool softeners and decrease your fiber supplement.  Switching to mild anti-diarrheal medications (Kayopectate, Pepto Bismol) can help.  Can try an imodium/loperamide dose.  If this worsens or does not improve, please call us. ° °5. Wound Care ° °a. Remove your bandages with your first bowel movement, usually the day after surgery.  You may have packing if you had an abscess.  Let any packing or gauze fall come out.   °b. Wear an absorbent pad or soft cotton balls in your underwear as needed to catch any drainage and help keep the area  °c. Keep the area clean and dry.  Bathe / shower every day.  Keep the area clean by showering / bathing over the incision / wound.   It is okay to soak an open wound to help wash it.  Consider using a squeeze bottle filled with warm water to gently wash the anal area.  Wet wipes or showers / gentle washing after bowel movements is often less traumatic than regular toilet paper. °d. You will often notice bleeding with bowel movements.  This should slow down by the end of the first week of surgery.  Sitting on an ice pack can  help. °e. Expect some drainage.  This should slow down by the end of the first week of surgery, but you will have occasional bleeding or drainage up to a few months after surgery.  Wear an absorbent pad or soft cotton gauze in your underwear until the drainage stops. ° °6. ACTIVITIES as tolerated:   °a. You may resume regular (light) daily activities beginning the next day--such as daily self-care, walking, climbing stairs--gradually increasing activities as tolerated.  If you can walk 30 minutes without difficulty, it is safe to try more intense activity such as jogging, treadmill, bicycling, low-impact aerobics, swimming, etc. °b. Save the most intensive and strenuous activity for last such as sit-ups, heavy lifting, contact sports, etc  Refrain from any heavy lifting or straining until you are off narcotics for pain control.   °c. DO NOT PUSH THROUGH PAIN.  Let pain   be your guide: If it hurts to do something, don't do it.  Pain is your body warning you to avoid that activity for another week until the pain goes down. d. You may drive when you are no longer taking prescription pain medication, you can comfortably sit for long periods of time, and you can safely maneuver your car and apply brakes. e. Dennis Bast may have sexual intercourse when it is comfortable.  7. FOLLOW UP in our office a. Please call [facility] at [phone] to set up an appointment to see your surgeon in the office for a follow-up appointment approximately 2-3 weeks after your surgery. b. Make sure that you call for this appointment the day you arrive home to ensure a convenient appointment time.  8. IF YOU HAVE DISABILITY OR FAMILY LEAVE FORMS, BRING THEM TO THE OFFICE FOR PROCESSING.  DO NOT GIVE THEM TO YOUR DOCTOR.        WHEN TO CALL us [phone]: 1. Poor pain control 2. Reactions / problems with new medications (rash/itching, nausea, etc)  3. Fever over 101.5 F (38.5 C) 4. Inability to urinate 5. Nausea and/or  vomiting 6. Worsening swelling or bruising 7. Continued bleeding from incision. 8. Increased pain, redness, or drainage from the incision  The clinic staff is available to answer your questions during regular business hours (8:30am-5pm).  Please dont hesitate to call and ask to speak to one of our nurses for clinical concerns.   A surgeon from [facility] is always on call at the hospitals   If you have a medical emergency, go to the nearest emergency room or call 911.    [facility], [address], [address], [place], [place]  [id number] ? MAIN: [phone] ? TOLL FREE: [phone] ? FAX [phone] [email or web address]     Anal Fistula  An anal fistula is a hole that develops between the bowel and the skin near the anus. The anus allows stool (feces) to leave the body. The anus has many tiny glands that make lubricating fluid. Sometimes, these glands become plugged and infected. This can cause a fluid-filled pocket (abscess) to form. An anal fistula often occurs when an abscess becomes infected and then develops into a hole between the bowel and the skin. What are the causes? In most cases, an anal fistula is caused by a past or current buildup of pus around the anus (anal abscess). Other causes include:  A complication of surgery.  Injury to the rectum or the area around it.  Using high-energy beams (radiation) to treat the area around the rectum. What increases the risk? You are more likely to develop this condition if you have certain medical conditions or diseases, including:  Chronic inflammatory bowel disease, such as Crohn's disease or ulcerative colitis.  Colon cancer or rectal cancer.  Diverticular disease, such as diverticulitis.  A sexually transmitted infection, or STI, such as gonorrhea, chlamydia, or syphilis.  An infection that is caused by HIV. What are the signs or symptoms? Symptoms of this condition  include:  Throbbing or constant pain that may be worse while you are sitting.  Swelling or irritation around the anus.  Pus or blood from an opening near the anus.  Pain when passing stool.  Fever or chills. How is this diagnosed? This condition is diagnosed based on:  A physical exam. This may include: ? An exam to find the external opening of the fistula. ? An exam with a probe or  scope to help locate the internal opening of the fistula. ? An exam of the rectum with a gloved hand (digital rectal exam).  Imaging tests that use dye to find the exact location and path of the fistula. Tests may include: ? X-rays. ? Ultrasound. ? CT scan. ? MRI.  Other tests to find the cause of the anal fistula. How is this treated? This condition is most commonly treated with surgery. The type of surgery that is used will depend on where the fistula is located and how complex the fistula is. Surgery may include:  A fistulotomy. The whole fistula is opened up, and the contents are drained to promote healing.  Seton placement. A silk string (seton) is placed into the fistula during a fistulotomy. This helps to drain any infection and promote healing.  Advancement flap procedure. Tissue is removed from your rectum or the skin around the anus and attached to the opening of the fistula.  Bioprosthetic plug. A cone-shaped plug is made from your tissue and is used to block the opening of the fistula. Some anal fistulas do not require surgery. A nonsurgical treatment option involves injecting a fibrin glue to seal the fistula. You also may be prescribed an antibiotic medicine to treat any infection. Follow these instructions at home: Medicines  Take over-the-counter and prescription medicines only as told by your health care provider.  If you were prescribed an antibiotic medicine, take it as told by your health care provider. Do not stop taking the antibiotic even if you start to feel better.  Use  a stool softener or a laxative if told to do so by your health care provider. General instructions   Eat a high-fiber diet as told by your health care provider. This can help to prevent constipation.  Drink enough fluid to keep your urine pale yellow.  Take a warm sitz bath for 15-20 minutes, 3-4 times per day, or as told by your health care provider. Sitz baths can ease your pain and discomfort and help with healing.  Follow good hygiene to keep the anal area as clean and dry as possible. Use wet toilet paper or a moist towelette after each bowel movement.  Keep all follow-up visits as told by your health care provider. This is important. Contact a health care provider if you have:  Increased pain that is not controlled with medicines.  New redness or swelling around the anal area.  New fluid, blood, or pus coming from the anal area.  Tenderness or warmth around the anal area. Get help right away if you have:  A fever.  Severe pain.  Chills or diarrhea.  Severe problems urinating or having a bowel movement. Summary  An anal fistula is a hole that develops between the bowel and the skin near the anus.  This condition is most often caused by a buildup of pus around the anus (anal abscess). Other causes include a complication of surgery, an injury to the rectum, or the use of radiation to treat the rectal area.  This condition is most commonly treated with surgery.  Follow your health care provider's instructions about taking medicines, eating and drinking, or taking sitz baths.  Call your health care provider if you have more pain, swelling, or blood. Get help right away if you have fever, severe pain, or problems passing urine or stool. This information is not intended to replace advice given to you by your health care provider. Make sure you discuss any questions you have  with your health care provider. Document Released: 04/22/2008 Document Revised: 09/23/2017 Document  Reviewed: 09/23/2017 Elsevier Interactive Patient Education  2019 [employer].  ######################################################################  EAT Gradually transition to a high fiber diet with a fiber supplement over the next few weeks after discharge.  Start with a pureed / full liquid diet (see below)  WALK Walk an hour a day.  Control your pain to do that.    HAVE A BOWEL MOVEMENT DAILY Keep your bowels regular to avoid problems.  OK to try a laxative to override constipation.  OK to use an antidairrheal to slow down diarrhea.  Call if not better after 2 tries  CALL IF YOU HAVE PROBLEMS/CONCERNS Call if you are still struggling despite following these instructions. Call if you have concerns not answered by these instructions  ######################################################################   Irregular bowel habits such as constipation and diarrhea can lead to many problems over time.  Having one soft bowel movement a day is the most important way to prevent further problems.  The anorectal canal is designed to handle stretching and feces to safely manage our ability to get rid of solid waste (feces, poop, stool) out of our body.  BUT, hard constipated stools can act like ripping concrete bricks and diarrhea can be a burning fire to this very sensitive area of our body, causing inflamed hemorrhoids, anal fissures, increasing risk is perirectal abscesses, abdominal pain/bloating, an making irritable bowel worse.      The goal: ONE SOFT BOWEL MOVEMENT A DAY!  To have soft, regular bowel movements:   Drink plenty of fluids, consider 4-6 tall glasses of water a day.    Take plenty of fiber.  Fiber is the undigested part of plant food that passes into the colon, acting s natures broom to encourage bowel motility and movement.  Fiber can absorb and hold large amounts of water. This results in a larger, bulkier stool, which is soft and easier  to pass. Work gradually over several weeks up to 6 servings a day of fiber (25g a day even more if needed) in the form of: o Vegetables -- Root (potatoes, carrots, turnips), leafy green (lettuce, salad greens, celery, spinach), or cooked high residue (cabbage, broccoli, etc) o Fruit -- Fresh (unpeeled skin & pulp), Dried (prunes, apricots, cherries, etc ),  or stewed ( applesauce)  o Whole grain breads, pasta, etc (whole wheat)  o Bran cereals   Bulking Agents -- This type of water-retaining fiber generally is easily obtained each day by one of the following:  o Psyllium bran -- The psyllium plant is remarkable because its ground seeds can retain so much water. This product is available as Metamucil, Konsyl, Effersyllium, Per Diem Fiber, or the less expensive generic preparation in drug and health food stores. Although labeled a laxative, it really is not a laxative.  o Methylcellulose -- This is another fiber derived from wood which also retains water. It is available as Citrucel. o Polyethylene Glycol - and artificial fiber commonly called Miralax or Glycolax.  It is helpful for people with gassy or bloated feelings with regular fiber o Flax Seed - a less gassy fiber than psyllium  No reading or other relaxing activity while on the toilet. If bowel movements take longer than 5 minutes, you are too constipated  AVOID CONSTIPATION.  High fiber and water intake usually takes care of this.  Sometimes a laxative is needed to stimulate more frequent bowel movements, but  Laxatives are not a good long-term solution as it can wear the colon out.  They can help jump-start bowels if constipated, but should be relied on constantly without discussing with your doctor o Osmotics (Milk of Magnesia, Fleets phosphosoda, Magnesium citrate, MiraLax, GoLytely) are safer than  o Stimulants (Senokot, Castor Oil, Dulcolax, Ex Lax)    o Avoid taking laxatives for more than 7 days in a row.   IF SEVERELY  CONSTIPATED, try a Bowel Retraining Program: o Do not use laxatives.  o Eat a diet high in roughage, such as bran cereals and leafy vegetables.  o Drink six (6) ounces of prune or apricot juice each morning.  o Eat two (2) large servings of stewed fruit each day.  o Take one (1) heaping tablespoon of a psyllium-based bulking agent twice a day. Use sugar-free sweetener when possible to avoid excessive calories.  o Eat a normal breakfast.  o Set aside 15 minutes after breakfast to sit on the toilet, but do not strain to have a bowel movement.  o If you do not have a bowel movement by the third day, use an enema and repeat the above steps.   Controlling diarrhea o Switch to liquids and simpler foods for a few days to avoid stressing your intestines further. o Avoid dairy products (especially milk & ice cream) for a short time.  The intestines often can lose the ability to digest lactose when stressed. o Avoid foods that cause gassiness or bloating.  Typical foods include beans and other legumes, cabbage, broccoli, and dairy foods.  Every person has some sensitivity to other foods, so listen to our body and avoid those foods that trigger problems for you. o Adding fiber (Citrucel, Metamucil, psyllium, Miralax) gradually can help thicken stools by absorbing excess fluid and retrain the intestines to act more normally.  Slowly increase the dose over a few weeks.  Too much fiber too soon can backfire and cause cramping & bloating. o Probiotics (such as active yogurt, Align, etc) may help repopulate the intestines and colon with normal bacteria and calm down a sensitive digestive tract.  Most studies show it to be of mild help, though, and such products can be costly. o Medicines: - Bismuth subsalicylate (ex. Kayopectate, Pepto Bismol) every 30 minutes for up to 6 doses can help control diarrhea.  Avoid if pregnant. - Loperamide (Immodium) can slow down diarrhea.  Start with two tablets (4mg  total) first  and then try one tablet every 6 hours.  Avoid if you are having fevers or severe pain.  If you are not better or start feeling worse, stop all medicines and call your doctor for advice o Call your doctor if you are getting worse or not better.  Sometimes further testing (cultures, endoscopy, X-ray studies, bloodwork, etc) may be needed to help diagnose and treat the cause of the diarrhea.  TROUBLESHOOTING IRREGULAR BOWELS 1) Avoid extremes of bowel movements (no bad constipation/diarrhea) 2) Miralax 17gm mixed in 8oz. water or juice-daily. May use BID as needed.  3) Gas-x,Phazyme, etc. as needed for gas & bloating.  4) Soft,bland diet. No spicy,greasy,fried foods.  5) Prilosec over-the-counter as needed  6) May hold gluten/wheat products from diet to see if symptoms improve.  7)  May try probiotics (Align, Activa, etc) to help calm the bowels down 7) If symptoms become worse call back immediately.

## 2018-08-15 NOTE — Transfer of Care (Signed)
Immediate Anesthesia Transfer of Care Note  Patient: [name]  Procedure(s) Performed: SUPERFICIAL ANAL FISTULOTOMY (N/A Rectum) HEMORRHOID LIGATION/PEXY (N/A Rectum)  Patient Location: PACU  Anesthesia Type:General  Level of Consciousness: awake, alert , oriented and patient cooperative  Airway & Oxygen Therapy: Patient Spontanous Breathing and Patient connected to face mask oxygen  Post-op Assessment: Report given to RN and Post -op Vital signs reviewed and stable  Post vital signs: Reviewed and stable  Last Vitals:  Vitals Value Taken Time  BP 100/67 08/15/2018  2:41 PM  Temp    Pulse 86 08/15/2018  2:41 PM  Resp 25 08/15/2018  2:41 PM  SpO2 100 % 08/15/2018  2:41 PM  Vitals shown include unvalidated device data.  Last Pain:  Vitals:   08/15/18 1126  TempSrc:   PainSc: 8       Patients Stated Pain Goal: 2 (70/48/88 9169)  Complications: No apparent anesthesia complications

## 2018-08-15 NOTE — Interval H&P Note (Signed)
History and Physical Interval Note:  08/15/2018 12:31 PM  [name]  has presented today for surgery, with the diagnosis of PERIRECTAL FISTULA.  The various methods of treatment have been discussed with the patient and family. After consideration of risks, benefits and other options for treatment, the patient has consented to  Procedure(s): REPAIR OF PERIRECTAL FISTULA, POSSIBLE FISTULOTOMY, (N/A) ANORECTAL EXAM UNDER ANESTHESIA, POSSIBLE HEMORRHOIDECTOMY (N/A) POSSIBLE PLACEMENT OF SETON (N/A) as a surgical intervention.  The patient's history has been reviewed, patient examined, no change in status, stable for surgery.  I have reviewed the patient's chart and labs.  Questions were answered to the patient's satisfaction.    I have re-reviewed the the patient's records, history, medications, and allergies.  I have re-examined the patient.  I again discussed intraoperative plans and goals of post-operative recovery.  The patient agrees to proceed.  [name]  10-19-1960 [id number]  Patient Care Team: Shelby Mattocks, PA-C as PCP - General (Internal Medicine) Michael Boston, MD as Consulting Physician (General Surgery) Juanita Craver, MD as Consulting Physician (Gastroenterology) Dixie Dials, MD as Consulting Physician (Cardiology) Donato Heinz, MD as Consulting Physician (Nephrology)  Patient Active Problem List   Diagnosis Date Noted  . Vertigo 07/31/2018  . Acute lower GI bleeding 06/21/2018  . Supratherapeutic INR 06/20/2018  . Melena 06/20/2018  . Malnutrition of moderate degree 02/10/2018  . Anemia 01/27/2018  . HCAP (healthcare-associated pneumonia) 01/26/2018  . CAD (coronary artery disease) 01/26/2018  . Atrial fibrillation, chronic 01/26/2018  . Depression 01/26/2018  . GERD (gastroesophageal reflux disease) 01/26/2018  . Pulmonary embolism ([place]) 01/26/2018  . History of atrial flutter 01/13/2018  . Hemorrhage of left kidney 01/13/2018  . Acute  respiratory failure with hypoxemia ([place]) 07/17/2017  . 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  . Acute on chronic combined systolic and diastolic CHF, NYHA class 4 ([place]) 07/17/2017  . Unresponsiveness 03/16/2017  . Hypotension 03/16/2017  . Paroxysmal atrial flutter ([place]) 03/16/2017  . Chronic pain 03/16/2017  . Leg pain, bilateral 03/16/2017  . Bile leak, postoperative 03/15/2016  . Elevated troponin 03/15/2016  . Rib pain on right side   . Biliary dyskinesia 02/12/2016  . Atrial flutter with rapid ventricular response ([place]) 06/27/2015  . Diverticulitis 04/21/2015  . Acute diverticulitis 04/21/2015  . Diverticulitis of intestine without perforation or abscess without bleeding 04/21/2015  . Pulmonary edema 01/13/2015  . Right upper quadrant abdominal pain 01/13/2015  . Acute respiratory failure with hypoxia ([place]) 01/13/2015  . Diverticulitis large intestine w/o perforation or abscess w/o bleeding 12/18/2014  . Hepatic cyst 12/18/2014  . Aftercare following surgery of the circulatory system, [place] 06/22/2013  . ESRD (end stage renal disease) on dialysis ([place]) 11/28/2012  . Right sided weakness 10/05/2012  . History of nephrectomy 07/04/2012  . End-stage renal disease on hemodialysis ([place]) 06/04/2011  . Chronic combined systolic (congestive) and diastolic (congestive) heart failure ([place]) 05/19/2011    Class: Acute  . Hypertension, benign 05/19/2011    Class: Chronic  . DM II (diabetes mellitus, type II), controlled ([place]) 05/19/2011    Class: Chronic  . Gout, chronic 05/19/2011    Past Medical History:  Diagnosis Date  . Atrial flutter ([place])   . 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 Cardiomyopathy   . CHF (congestive heart failure) ([place])   . Coronary artery disease    normal coronaries by 10/10/08 cath, Cardiac Cath 08-04-12 epic.Dr. Doylene Canard follows  . Diabetes mellitus    pt. states  he's borderline diabetic., no longer taking med,- off med. since 2013  . Dialysis  patient [facility])    Mon-Wed-Fri([place])- Left AV fistula  . Diverticulitis November 2016   reoccurred in December 2016  . ESRD (end stage renal disease) ([place])   . GERD (gastroesophageal reflux disease)   . Gout   . History of nephrectomy 07/04/2012   History of right nephrectomy in 2002 for renal cell carcinoma   . History of unilateral nephrectomy   . Hypertension   . Low iron   . Myocardial infarction [facility]) ?2006  . Renal cell carcinoma    dialysis- MWF- Dr. Mercy Moore follows.  . Renal insufficiency   . Shortness of breath 05/19/11   "at rest, lying down, w/exertion"  . Stroke [facility]) 02/2011   05/19/11 denies residual  . Umbilical hernia 81/19/14   unrepaired    Past Surgical History:  Procedure Laterality Date  . AV FISTULA PLACEMENT  03/29/2011   Procedure: ARTERIOVENOUS (AV) FISTULA CREATION;  Surgeon: Hinda Lenis, MD;  Location: [place];  Service: Vascular;  Laterality: Left;  LEFT RADIOCEPHALIC , Arteriovenous [id number])  . CARDIAC CATHETERIZATION  05/21/11  . CARDIAC CATHETERIZATION N/A 03/16/2016   Procedure: Left Heart Cath and Coronary Angiography;  Surgeon: Dixie Dials, MD;  Location: [place] CV LAB;  Service: Cardiovascular;  Laterality: N/A;  . CHOLECYSTECTOMY N/A 02/12/2016   Procedure: LAPAROSCOPIC CHOLECYSTECTOMY WITH INTRAOPERATIVE CHOLANGIOGRAM;  Surgeon: Jackolyn Confer, MD;  Location: [place];  Service: General;  Laterality: N/A;  . COLONOSCOPY WITH PROPOFOL N/A 02/01/2017   Procedure: COLONOSCOPY WITH PROPOFOL;  Surgeon: Juanita Craver, MD;  Location: [facility] END[facility];  Service: Endoscopy;  Laterality: N/A;  . dialysis cath placed    . ESOPHAGOGASTRODUODENOSCOPY (EGD) WITH PROPOFOL N/A 12/02/2015   Procedure: ESOPHAGOGASTRODUODENOSCOPY (EGD) WITH PROPOFOL;  Surgeon: Juanita Craver, MD;  Location: [facility];  Service: Endoscopy;  Laterality: N/A;  . FINGER SURGERY     L pinkie finger- ORIF- /w remaining hardware - 1990's    . HERNIA REPAIR  N/A [id number]/[id number]   Umbilical hernia repair  . INSERTION OF DIALYSIS CATHETER N/A 01/27/2016   Procedure: INSERTION OF DIALYSIS CATHETER;  Surgeon: Waynetta Sandy, MD;  Location: [place];  Service: Vascular;  Laterality: N/A;  . INSERTION OF MESH N/A 07/04/2012   Procedure: INSERTION OF MESH;  Surgeon: Madilyn Hook, DO;  Location: [place];  Service: General;  Laterality: N/A;  . IR EMBO ART  VEN HEMORR LYMPH EXTRAV  INC GUIDE ROADMAPPING  01/13/2018  . IR FLUORO GUIDE CV LINE RIGHT  01/13/2018  . IR IVC FILTER PLMT / S&I /IMG GUID/MOD SED  01/27/2018  . IR RENAL SELECTIVE  UNI INC S&I MOD SED  01/13/2018  . IR US GUIDE VASC ACCESS RIGHT  01/13/2018  . KIDNEY CYST REMOVAL  2019  . LAPAROSCOPIC LYSIS OF ADHESIONS N/A 02/12/2016   Procedure: LAPAROSCOPIC LYSIS OF ADHESIONS;  Surgeon: Jackolyn Confer, MD;  Location: [place];  Service: General;  Laterality: N/A;  . LEFT AND RIGHT HEART CATHETERIZATION WITH CORONARY ANGIOGRAM N/A 08/04/2012   Procedure: LEFT AND RIGHT HEART CATHETERIZATION WITH CORONARY ANGIOGRAM;  Surgeon: Birdie Riddle, MD;  Location: [place] CATH LAB;  Service: Cardiovascular;  Laterality: N/A;  . NEPHRECTOMY  2000   right  . REVISON OF ARTERIOVENOUS FISTULA Left 06/05/2013   Procedure: REVISON OF LEFT RADIOCEPHALIC  ARTERIOVENOUS FISTULA;  Surgeon: Conrad [place], MD;  Location: [place];  Service: Vascular;  Laterality: Left;  . REVISON OF ARTERIOVENOUS  FISTULA Left 01/27/2016   Procedure: REVISION OF LEFT UPPER EXTREMITY ARTERIOVENOUS FISTULA;  Surgeon: Waynetta Sandy, MD;  Location: [place];  Service: Vascular;  Laterality: Left;  . REVISON OF ARTERIOVENOUS FISTULA Left 08/03/2016   Procedure: REVISON OF Left arm ARTERIOVENOUS FISTULA;  Surgeon: Elam Dutch, MD;  Location: [place];  Service: Vascular;  Laterality: Left;  . REVISON OF ARTERIOVENOUS FISTULA Left 05/06/2017   Procedure: REVISION PLICATION OF ARTERIOVENOUS FISTULA LEFT ARM;  Surgeon: Rosetta Posner, MD;  Location: [place];   Service: Vascular;  Laterality: Left;  . RIGHT HEART CATHETERIZATION N/A 05/21/2011   Procedure: RIGHT HEART CATH;  Surgeon: Birdie Riddle, MD;  Location: [facility] CATH LAB;  Service: Cardiovascular;  Laterality: N/A;  . smashed  1990's   "left pinky; have a plate in there"  . UMBILICAL HERNIA REPAIR N/A 07/04/2012   Procedure: LAPAROSCOPIC UMBILICAL HERNIA;  Surgeon: Madilyn Hook, DO;  Location: [place];  Service: General;  Laterality: N/A;  . US ECHOCARDIOGRAPHY  05/20/11    Social History   Socioeconomic History  . Marital status: Married    Spouse name: Not on file  . Number of children: Not on file  . Years of education: Not on file  . Highest education level: Not on file  Occupational History  . Not on file  Social Needs  . Financial resource strain: Not on file  . Food insecurity:    Worry: Not on file    Inability: Not on file  . Transportation needs:    Medical: Not on file    Non-medical: Not on file  Tobacco Use  . Smoking status: Never Smoker  . Smokeless tobacco: Never Used  Substance and Sexual Activity  . Alcohol use: No    Alcohol/week: 0.0 standard drinks  . Drug use: No    Types: Cocaine    Comment: Past hx-none in 20 yrs  . Sexual activity: Yes  Lifestyle  . Physical activity:    Days per week: Not on file    Minutes per session: Not on file  . Stress: Not on file  Relationships  . Social connections:    Talks on phone: Not on file    Gets together: Not on file    Attends religious service: Not on file    Active member of club or organization: Not on file    Attends meetings of clubs or organizations: Not on file    Relationship status: Not on file  . Intimate partner violence:    Fear of current or ex partner: Not on file    Emotionally abused: Not on file    Physically abused: Not on file    Forced sexual activity: Not on file  Other Topics Concern  . Not on file  Social History Narrative  . Not on file    Family History  Problem Relation  Age of Onset  . Hypertension Mother   . Kidney disease Mother     Medications Prior to Admission  Medication Sig Dispense Refill Last Dose  . allopurinol (ZYLOPRIM) 300 MG tablet Take 150 mg by mouth every other day.   Past Week at Unknown time  . Amino Acids-Protein Hydrolys (FEEDING SUPPLEMENT, PRO-STAT SUGAR FREE 64,) LIQD Take 30 mLs by mouth 2 (two) times daily. 900 mL 0 Past Week at Unknown time  . amiodarone (PACERONE) 200 MG tablet Take 0.5 tablets (100 mg total) by mouth daily. 30 tablet 1 Past Week at Unknown time  .  amoxicillin (AMOXIL) 875 MG tablet Take 875 mg by mouth daily.   08/15/2018 at 0700  . calcium acetate (PHOSLO) 667 MG capsule Take 667-2,001 mg by mouth See admin instructions. Take 2001 mg  capsules with meals and 800 mg capsule with snacks.   Past Week at Unknown time  . chlorhexidine (PERIDEX) 0.12 % solution 15 mLs by Mouth Rinse route daily as needed. Mouth rinse   Past Week at Unknown time  . doxercalciferol (HECTOROL) 4 MCG/2ML injection Inject 1.5 mLs (3 mcg total) into the vein every Monday, Wednesday, and Friday with hemodialysis.   Past Week at Unknown time  . ferric citrate (AURYXIA) 1 GM 210 MG(Fe) tablet Take 210 mg by mouth 3 (three) times daily with meals.    Past Week at Unknown time  . isosorbide dinitrate (ISORDIL) 20 MG tablet Take 20 mg by mouth 2 (two) times daily.   Past Week at Unknown time  . Lidocaine, Anorectal, (L-M-X 5) 5 % CREA Apply 1 application topically 4 (four) times daily as needed (apply to area up to 6 times daily as needed). 1 Tube 0 08/14/2018 at Unknown time  . lidocaine-prilocaine (EMLA) cream Apply 1 application topically See admin instructions. Apply topically to port access prior to dialysis - Monday, Wednesday, Friday  12 08/14/2018 at Unknown time  . meclizine (ANTIVERT) 25 MG tablet Take 1 tablet (25 mg total) by mouth 3 (three) times daily as needed for dizziness. 30 tablet 0 Past Week at Unknown time  . midodrine (PROAMATINE)  10 MG tablet 30 mg  on the days of dyalisis three times a week, and only takes 10 mg on off dyalisis days. (Patient taking differently: Take 10-15 mg by mouth See admin instructions. 15 mg  on the days of dyalisis three times a week, and only takes 10 mg on off dyalisis days.) 1 tablet 0 Past Week at Unknown time  . mirtazapine (REMERON) 7.5 MG tablet Take 1 tablet (7.5 mg total) by mouth at bedtime. 30 tablet 2 Past Week at Unknown time  . multivitamin (RENA-VIT) TABS tablet Take 1 tablet by mouth at bedtime.   Past Week at Unknown time  . pantoprazole (PROTONIX) 40 MG tablet Take 40 mg by mouth 2 (two) times daily.   Past Week at Unknown time  . warfarin (COUMADIN) 10 MG tablet Take 1 tablet (10 mg total) by mouth daily at 6 PM.   08/10/2018 at Unknown time  . acetaminophen (TYLENOL) 325 MG tablet Take 2 tablets (650 mg total) by mouth every 6 (six) hours as needed for mild pain (or Fever >/= 101).   Unknown at Unknown time  . hydrocortisone (ANUSOL-HC) 25 MG suppository Place 1 suppository (25 mg total) rectally every 12 (twelve) hours. (Patient not taking: Reported on 07/31/2018) 12 suppository 1 Not Taking at Unknown time  . nitroGLYCERIN (NITROSTAT) 0.4 MG SL tablet Place 0.4 mg under the tongue every 5 (five) minutes as needed for chest pain.   0 unknown at prn  . UNABLE TO FIND Out patient physical therapy- vestibular therapy for BPPV 1 each 0     Current Facility-Administered Medications  Medication Dose Route Frequency Provider Last Rate Last Dose  . 0.9 %  sodium chloride infusion   Intravenous Continuous Nolon Nations, MD 10 mL/hr at 08/15/18 1132    . bupivacaine liposome (EXPAREL) 1.3 % injection 266 mg  20 mL Infiltration To Rosemarie Beath, MD      . cefTRIAXone (ROCEPHIN) 2 g in sodium  chloride 0.9 % 100 mL IVPB  2 g Intravenous To Rosemarie Beath, MD       And  . metroNIDAZOLE (FLAGYL) IVPB 500 mg  500 mg Intravenous To Rosemarie Beath, MD      . Chlorhexidine  Gluconate Cloth 2 % PADS 6 each  6 each Topical Once Michael Boston, MD       And  . Chlorhexidine Gluconate Cloth 2 % PADS 6 each  6 each Topical Once Michael Boston, MD         Allergies  Allergen Reactions  . Ace Inhibitors Itching and Cough    BP (!) 80/53   Pulse 94   Temp 97.9 F (36.6 C) (Oral)   Resp 18   Ht 6' (1.829 m)   Wt 108.7 kg   SpO2 100%   BMI 32.51 kg/m   Labs: Results for orders placed or performed during the hospital encounter of 08/15/18 (from the past 48 hour(s))  Glucose, capillary     Status: None   Collection Time: 08/15/18 11:09 AM  Result Value Ref Range   Glucose-Capillary 91 70 - 99 mg/dL  I-STAT 4, (NA,K, GLUC, HGB,HCT)     Status: Abnormal   Collection Time: 08/15/18 12:01 PM  Result Value Ref Range   Sodium 135 135 - 145 mmol/L   Potassium 3.8 3.5 - 5.1 mmol/L   Glucose, Bld 81 70 - 99 mg/dL   HCT 33.0 (L) 39.0 - 52.0 %   Hemoglobin 11.2 (L) 13.0 - 17.0 g/dL    Imaging / Studies: Dg Chest 2 View  Result Date: 07/31/2018 CLINICAL DATA:  Shortness of breath and dizziness EXAM: CHEST - 2 VIEW COMPARISON:  01/27/2018 FINDINGS: Low volume chest with interstitial crowding. There is no edema, consolidation, effusion, or pneumothorax. Chronic cardiomegaly. IMPRESSION: 1. No evidence of acute disease. 2. Cardiomegaly. Electronically Signed   By: Monte Fantasia M.D.   On: 07/31/2018 06:48   Ct Head Wo Contrast  Result Date: 07/31/2018 CLINICAL DATA:  Episodic vertigo.  Renal failure EXAM: CT HEAD WITHOUT CONTRAST TECHNIQUE: Contiguous axial images were obtained from the base of the skull through the vertex without intravenous contrast. COMPARISON:  February 01, 2018 FINDINGS: Brain: The ventricles and sulci are within normal limits for age. There is no intracranial mass, hemorrhage, extra-axial fluid collection, or midline shift. There is mild small vessel disease in the centra semiovale bilaterally. There are occasional nonacute appearing lacunar type  infarcts within each centrum semiovale. There is evidence of a prior infarct in the medial right upper pons, stable. No acute infarct is demonstrable on this study. Vascular: No hyperdense vessel. There is calcification in each carotid siphon and distal left vertebral artery. The basilar artery is somewhat dolichoectatic, stable. Skull: Bony calvarium appears intact. Sinuses/Orbits: There is mucosal thickening in several ethmoid air cells. Other visualized paranasal sinuses are clear. Orbits appear symmetric bilaterally. Other: There is mucosal thickening in several medial mastoid air cells bilaterally. The middle and inner ear regions appear symmetric bilaterally. IMPRESSION: Scattered supratentorial and infratentorial lacunar type infarcts. No acute infarct evident. There is mild periventricular small vessel disease. No mass or hemorrhage. Multiple foci of arterial vascular calcification noted. There is mucosal thickening in several ethmoid air cells. There is mucosal thickening in several medial mastoid air cells bilaterally. Electronically Signed   By: Lowella Grip III M.D.   On: 07/31/2018 07:13   Mr Brain Wo Contrast  Result Date: 07/31/2018 CLINICAL DATA:  Vertigo persistent, central EXAM:  MRI HEAD WITHOUT CONTRAST TECHNIQUE: Multiplanar, multiecho pulse sequences of the brain and surrounding structures were obtained without intravenous contrast. COMPARISON:  CT head 07/31/2018 FINDINGS: Incomplete exam. The patient refused to complete the exam. Sagittal T1 and axial and coronal diffusion sequences performed Negative for acute infarct. Ventricle size normal. Chronic infarcts in the pons bilaterally and in the white matter bilaterally. IMPRESSION: Incomplete exam.  Negative for acute infarct. Electronically Signed   By: Franchot Gallo M.D.   On: 07/31/2018 11:27     .Adin Hector, M.D., F.A.C.S. Gastrointestinal and Minimally Invasive Surgery [facility] [address]  [place], [place] [address] [phone] Main / Paging  08/15/2018 12:32 PM    Adin Hector

## 2018-08-16 ENCOUNTER — Encounter (HOSPITAL_COMMUNITY): Payer: Self-pay | Admitting: Surgery

## 2018-08-16 DIAGNOSIS — K603 Anal fistula: Secondary | ICD-10-CM | POA: Diagnosis not present

## 2018-08-16 DIAGNOSIS — K580 Irritable bowel syndrome with diarrhea: Secondary | ICD-10-CM | POA: Diagnosis not present

## 2018-08-16 DIAGNOSIS — K644 Residual hemorrhoidal skin tags: Secondary | ICD-10-CM | POA: Diagnosis not present

## 2018-08-16 DIAGNOSIS — K640 First degree hemorrhoids: Secondary | ICD-10-CM | POA: Diagnosis not present

## 2018-08-16 DIAGNOSIS — K642 Third degree hemorrhoids: Secondary | ICD-10-CM | POA: Diagnosis not present

## 2018-08-16 DIAGNOSIS — I132 Hypertensive heart and chronic kidney disease with heart failure and with stage 5 chronic kidney disease, or end stage renal disease: Secondary | ICD-10-CM | POA: Diagnosis not present

## 2018-08-16 LAB — CBC
HCT: 31.2 % — ABNORMAL LOW (ref 39.0–52.0)
Hemoglobin: 10.2 g/dL — ABNORMAL LOW (ref 13.0–17.0)
MCH: 32.5 pg (ref 26.0–34.0)
MCHC: 32.7 g/dL (ref 30.0–36.0)
MCV: 99.4 fL (ref 80.0–100.0)
Platelets: 200 10*3/uL (ref 150–400)
RBC: 3.14 MIL/uL — ABNORMAL LOW (ref 4.22–5.81)
RDW: 16.6 % — ABNORMAL HIGH (ref 11.5–15.5)
WBC: 12.9 10*3/uL — ABNORMAL HIGH (ref 4.0–10.5)
nRBC: 0 % (ref 0.0–0.2)

## 2018-08-16 LAB — RENAL FUNCTION PANEL
Albumin: 3.3 g/dL — ABNORMAL LOW (ref 3.5–5.0)
Anion gap: 16 — ABNORMAL HIGH (ref 5–15)
BUN: 46 mg/dL — ABNORMAL HIGH (ref 6–20)
CO2: 23 mmol/L (ref 22–32)
Calcium: 9.5 mg/dL (ref 8.9–10.3)
Chloride: 94 mmol/L — ABNORMAL LOW (ref 98–111)
Creatinine, Ser: 11.78 mg/dL — ABNORMAL HIGH (ref 0.61–1.24)
GFR calc Af Amer: 5 mL/min — ABNORMAL LOW (ref >60)
GFR calc non Af Amer: 4 mL/min — ABNORMAL LOW (ref >60)
Glucose, Bld: 100 mg/dL — ABNORMAL HIGH (ref 70–99)
Phosphorus: 8.2 mg/dL — ABNORMAL HIGH (ref 2.5–4.6)
Potassium: 5 mmol/L (ref 3.5–5.1)
Sodium: 133 mmol/L — ABNORMAL LOW (ref 135–145)

## 2018-08-16 LAB — GLUCOSE, CAPILLARY
Glucose-Capillary: 91 mg/dL (ref 70–99)
Glucose-Capillary: 76 mg/dL (ref 70–99)

## 2018-08-16 MED ORDER — SODIUM CHLORIDE 0.9 % IV SOLN: 100.0000 mL | INTRAVENOUS | Status: DC | PRN

## 2018-08-16 MED ORDER — HEPARIN SODIUM (PORCINE) 1000 UNIT/ML DIALYSIS: 1000.0000 [IU] | INTRAMUSCULAR | Status: DC | PRN

## 2018-08-16 MED ORDER — PENTAFLUOROPROP-TETRAFLUOROETH EX AERO: 1.0000 "application " | INHALATION_SPRAY | CUTANEOUS | Status: DC | PRN

## 2018-08-16 MED ORDER — LIDOCAINE-PRILOCAINE 2.5-2.5 % EX CREA: 1.0000 "application " | TOPICAL_CREAM | CUTANEOUS | Status: DC | PRN

## 2018-08-16 MED ORDER — DOXERCALCIFEROL 4 MCG/2ML IV SOLN
INTRAVENOUS | Status: AC
  Administered 2018-08-16: 4 ug via INTRAVENOUS
  Filled 2018-08-16: qty 2

## 2018-08-16 MED ORDER — LIDOCAINE HCL (PF) 1 % IJ SOLN: 5.0000 mL | INTRAMUSCULAR | Status: DC | PRN

## 2018-08-16 NOTE — Progress Notes (Signed)
Spoke with Dr. Marval Regal who states patient is cleared to be discharged from nephrology standpoint. Patient discharged to home. Verbalizes understanding of all discharge instructions including incision care, discharge medications, and follow up MD visits. Patient's wife listened to d/c instructions via phone.

## 2018-08-16 NOTE — Discharge Summary (Signed)
Physician Discharge Summary    Patient ID: [name] MRN: [id number] DOB/AGE: 1961-04-13  58 y.o.  Patient Care Team: Rodriguez-Southworth, Sandrea Matte as PCP - General (Internal Medicine) Michael Boston, MD as Consulting Physician (General Surgery) Juanita Craver, MD as Consulting Physician (Gastroenterology) Dixie Dials, MD as Consulting Physician (Cardiology) Donato Heinz, MD as Consulting Physician (Nephrology)  Admit date: 08/15/2018  Discharge date: 08/16/2018  Hospital Stay = 0 days    Discharge Diagnoses:  Active Problems:   End-stage renal disease on hemodialysis (HCC)   Atrial fibrillation, chronic   DM II (diabetes mellitus, type II), controlled (HCC)   Gout, chronic   History of nephrectomy   GERD (gastroesophageal reflux disease)   Pulmonary embolism (HCC)   Fistula, anal   1 Day Post-Op  08/15/2018  POST-OPERATIVE DIAGNOSIS:   SUPERFICIAL ANAL FISTULA GRADE 2 & 3 INTERNAL HEMORRHOIDS EXTERNAL HEMORRHOID   PROCEDURE:   SUPERFICIAL ANAL FISTULOTOMY HEMORRHOID LIGATION/PEXY ANORECTAL EXAMINATION UNDER ANESTHESIA  SURGEON:  Adin Hector, MD  Consults: nephrology  Hospital Course:   Patient with renal disease on hemodialysis, chronic coagulation for chronic atrial fibrillation.  Severe anal pain refractory to antibiotics with bleeding.  Anal abscesses & fistula needing frequent antibiotics & worsening painThe patient underwent the surgery above.  Postoperatively, the patient gradually mobilized and advanced to a solid diet.  Pain and other symptoms were treated aggressively.    By the time of discharge, the patient was walking well the hallways, eating food, having flatus.  Pain was well-controlled on an oral medications.  Based on meeting discharge criteria and continuing to recover, I felt it was safe for the patient to be discharged from the hospital after hemodialysis & clearance by nephrology to further recover with close followup.  Postoperative recommendations were discussed in detail with patient, his wife (over the phone), and RN in room.  They are written as well.  Discharged Condition: good  Discharge Exam: Blood pressure 117/69, pulse 75, temperature 97.6 F (36.4 C), temperature source Oral, resp. rate 18, height 6' (1.829 m), weight 108.7 kg, SpO2 90 %.  General: Pt awake/alert/oriented x4 in No acute distress Eyes: PERRL, normal EOM.  Sclera clear.  No icterus Neuro: CN II-XII intact w/o focal sensory/motor deficits. Lymph: No head/neck/groin lymphadenopathy Psych:  No delerium/psychosis/paranoia HENT: Normocephalic, Mucus membranes moist.  No thrush Neck: Supple, No tracheal deviation Chest: No chest wall pain w good excursion CV:  Pulses intact.  Regular rhythm MS: Normal AROM mjr joints.  No obvious deformity.  LUE fistula Abdomen: Soft.  Nondistended.  Nontender.  No evidence of peritonitis.  No incarcerated hernias. Rectal - no active bleeding - held off on internal exam Ext:  SCDs BLE.  No mjr edema.  No cyanosis Skin: No petechiae / purpura   Disposition:   Follow-up Information    Michael Boston, MD. Schedule an appointment as soon as possible for a visit in 3 weeks.   Specialty:  General Surgery Why:  To follow up after your operation Contact information: [place] [address] [phone]           Discharge disposition: 01-Home or Self Care       Discharge Instructions    Call MD for:  hives   Complete by:  As directed    Call MD for:  persistant dizziness or light-headedness   Complete by:  As directed    Call MD for:  persistant nausea and vomiting   Complete by:  As  directed    Call MD for:  redness, tenderness, or signs of infection (pain, swelling, redness, odor or green/yellow discharge around incision site)   Complete by:  As directed    You will often notice bleeding with bowel movements.  Expect some yellow or tan drainage.  This can  occur for weeks, but should be mild by the end of the first week of surgery.  Wear an absorbent pad or soft cotton gauze in your underwear until the drainage stops   Call MD for:  severe uncontrolled pain   Complete by:  As directed    Diet - low sodium heart healthy   Complete by:  As directed    Discharge instructions   Complete by:  As directed    See Rectal Surgery instruction sheet   Discharge wound care:   Complete by:  As directed    -Allow any ribbon or fluffed gauze to fall out with the 1st bowel movement -Bathe / shower every day.  Just warm water.  Avoid salts/soaps.  Keep the area clean by showering / bathing over the incision / wound.   It is okay to soak an open wound to help wash it.   -Wet wipes or showers / gentle washing after bowel movements is often less traumatic than regular toilet paper.  Consider using a squeeze bottle of warm water to rinse the perianal region. -You will notice bleeding & yellow drainage with bowel movements.  This should slow down by the end of the first week of surgery, but expect some drainage for many weeks.  Wear an absorbent pad or soft cotton gauze in your underwear as needed to catch any drainage and help keep the area clean and dry.   Driving Restrictions   Complete by:  As directed    You may drive when you are no longer taking prescription pain medication, you can comfortably sit for long periods of time, and you can safely maneuver your car and apply brakes.   Increase activity slowly   Complete by:  As directed    Lifting restrictions   Complete by:  As directed    You may resume regular (light) daily activities beginning the next day-such as daily self-care, walking, climbing stairs-gradually increasing activities as tolerated.  If you can walk 30 minutes without difficulty, it is safe to try more intense activity such as jogging, treadmill, bicycling, low-impact aerobics, swimming, etc. Save the most intensive and strenuous activity for  last such as sit-ups, heavy lifting, contact sports, etc  Refrain from any heavy lifting or straining until you are off narcotics for pain control.  Remember: if it hurts to do it, don't do it: STOP   May shower / Bathe   Complete by:  As directed    Warm water sitz baths/tub soaks x 20-30 minutes, 4-8 times a day for comfort   May walk up steps   Complete by:  As directed    Sexual Activity Restrictions   Complete by:  As directed    You may have sexual intercourse when it is comfortable. If it hurts to do it, STOP.      Allergies as of 08/16/2018      Reactions   Ace Inhibitors Itching, Cough      Medication List    TAKE these medications   acetaminophen 325 MG tablet Commonly known as:  TYLENOL Take 2 tablets (650 mg total) by mouth every 6 (six) hours as needed for mild pain (or  Fever >/= 101).   allopurinol 300 MG tablet Commonly known as:  ZYLOPRIM Take 150 mg by mouth every other day.   amiodarone 200 MG tablet Commonly known as:  PACERONE Take 0.5 tablets (100 mg total) by mouth daily.   Auryxia 1 GM 210 MG(Fe) tablet Generic drug:  ferric citrate Take 210 mg by mouth 3 (three) times daily with meals.   calcium acetate 667 MG capsule Commonly known as:  PHOSLO Take 667-2,001 mg by mouth See admin instructions. Take 2001 mg  capsules with meals and 800 mg capsule with snacks.   chlorhexidine 0.12 % solution Commonly known as:  PERIDEX 15 mLs by Mouth Rinse route daily as needed. Mouth rinse   doxercalciferol 4 MCG/2ML injection Commonly known as:  HECTOROL Inject 1.5 mLs (3 mcg total) into the vein every Monday, Wednesday, and Friday with hemodialysis.   feeding supplement (PRO-STAT SUGAR FREE 64) Liqd Take 30 mLs by mouth 2 (two) times daily.   isosorbide dinitrate 20 MG tablet Commonly known as:  ISORDIL Take 20 mg by mouth 2 (two) times daily.   Lidocaine (Anorectal) 5 % Crea Commonly known as:  L-M-X 5 Apply 1 application topically 4 (four) times  daily as needed (apply to area up to 6 times daily as needed).   lidocaine-prilocaine cream Commonly known as:  EMLA Apply 1 application topically See admin instructions. Apply topically to port access prior to dialysis - Monday, Wednesday, Friday   meclizine 25 MG tablet Commonly known as:  ANTIVERT Take 1 tablet (25 mg total) by mouth 3 (three) times daily as needed for dizziness.   midodrine 10 MG tablet Commonly known as:  PROAMATINE 30 mg  on the days of dyalisis three times a week, and only takes 10 mg on off dyalisis days. What changed:    how much to take  how to take this  when to take this  additional instructions   mirtazapine 7.5 MG tablet Commonly known as:  REMERON Take 1 tablet (7.5 mg total) by mouth at bedtime.   multivitamin Tabs tablet Take 1 tablet by mouth at bedtime.   nitroGLYCERIN 0.4 MG SL tablet Commonly known as:  NITROSTAT Place 0.4 mg under the tongue every 5 (five) minutes as needed for chest pain.   oxyCODONE 5 MG immediate release tablet Commonly known as:  Oxy IR/ROXICODONE Take 1-2 tablets (5-10 mg total) by mouth every 6 (six) hours as needed for severe pain or breakthrough pain.   pantoprazole 40 MG tablet Commonly known as:  PROTONIX Take 40 mg by mouth 2 (two) times daily.   UNABLE TO FIND Out patient physical therapy- vestibular therapy for BPPV   warfarin 10 MG tablet Commonly known as:  COUMADIN Take 1 tablet (10 mg total) by mouth daily at 6 PM.            Discharge Care Instructions  (From admission, onward)         Start     Ordered   08/15/18 0000  Discharge wound care:    Comments:  -Allow any ribbon or fluffed gauze to fall out with the 1st bowel movement -Bathe / shower every day.  Just warm water.  Avoid salts/soaps.  Keep the area clean by showering / bathing over the incision / wound.   It is okay to soak an open wound to help wash it.   -Wet wipes or showers / gentle washing after bowel movements is  often less traumatic than regular toilet paper.  Consider using a squeeze bottle of warm water to rinse the perianal region. -You will notice bleeding & yellow drainage with bowel movements.  This should slow down by the end of the first week of surgery, but expect some drainage for many weeks.  Wear an absorbent pad or soft cotton gauze in your underwear as needed to catch any drainage and help keep the area clean and dry.   08/15/18 1249          Significant Diagnostic Studies:  Results for orders placed or performed during the hospital encounter of 08/15/18 (from the past 72 hour(s))  Glucose, capillary     Status: None   Collection Time: 08/15/18 11:09 AM  Result Value Ref Range   Glucose-Capillary 91 70 - 99 mg/dL  I-STAT 4, (NA,K, GLUC, HGB,HCT)     Status: Abnormal   Collection Time: 08/15/18 12:01 PM  Result Value Ref Range   Sodium 135 135 - 145 mmol/L   Potassium 3.8 3.5 - 5.1 mmol/L   Glucose, Bld 81 70 - 99 mg/dL   HCT 33.0 (L) 39.0 - 52.0 %   Hemoglobin 11.2 (L) 13.0 - 17.0 g/dL  PT- INR Day of Surgery     Status: Abnormal   Collection Time: 08/15/18 12:06 PM  Result Value Ref Range   Prothrombin Time 16.2 (H) 11.4 - 15.2 seconds   INR 1.3 (H) 0.8 - 1.2    Comment: (NOTE) INR goal varies based on device and disease states. Performed at [facility], [place] [address] [place], [place] [address]   Glucose, capillary     Status: None   Collection Time: 08/15/18  2:41 PM  Result Value Ref Range   Glucose-Capillary 91 70 - 99 mg/dL  CBC     Status: Abnormal   Collection Time: 08/15/18  4:27 PM  Result Value Ref Range   WBC 7.6 4.0 - 10.5 K/uL   RBC 3.62 (L) 4.22 - 5.81 MIL/uL   Hemoglobin 11.8 (L) 13.0 - 17.0 g/dL   HCT 37.1 (L) 39.0 - 52.0 %   MCV 102.5 (H) 80.0 - 100.0 fL   MCH 32.6 26.0 - 34.0 pg   MCHC 31.8 30.0 - 36.0 g/dL   RDW 16.9 (H) 11.5 - 15.5 %   Platelets 160 150 - 400 K/uL   nRBC 0.0 0.0 - 0.2 %    Comment: Performed at [facility], [place] [address], [place], [place] [address]  Creatinine, serum     Status: Abnormal   Collection Time: 08/15/18  4:27 PM  Result Value Ref Range   Creatinine, Ser 10.38 (H) 0.61 - 1.24 mg/dL   GFR calc non Af Amer 5 (L) >60 mL/min   GFR calc Af Amer 6 (L) >60 mL/min    Comment: Performed at [facility] [address] [place], [place] [address]  Glucose, capillary     Status: Abnormal   Collection Time: 08/15/18  9:09 PM  Result Value Ref Range   Glucose-Capillary 122 (H) 70 - 99 mg/dL  Glucose, capillary     Status: None   Collection Time: 08/16/18  7:32 AM  Result Value Ref Range   Glucose-Capillary 91 70 - 99 mg/dL    No results found.  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 Doylene Canard follows   Diabetes mellitus    pt. states he's borderline diabetic., no longer taking med,- off med. since 2013   Dialysis patient [facility])    Mon-Wed-Fri([place])- Left AV fistula   Diverticulitis November 2016   reoccurred in December 2016   Diverticulitis of intestine without perforation or abscess without bleeding 04/21/2015   ESRD (end stage renal disease) ([place])    GERD (gastroesophageal reflux disease)    Gout    Hemorrhage of left kidney 01/13/2018   History of nephrectomy 07/04/2012   History of right nephrectomy in 2002 for renal cell carcinoma    History of unilateral nephrectomy    Hypertension    Low iron    Myocardial infarction [facility]) ?2006   Renal cell carcinoma    dialysis- MWF- Dr. Mercy Moore follows.   Renal insufficiency    Shortness of breath 05/19/11   "at rest, lying  down, w/exertion"   Stroke [facility]) 02/2011   05/19/11 denies residual   Umbilical hernia 99/37/16   unrepaired    Past Surgical History:  Procedure Laterality Date   ANAL FISTULOTOMY  08/15/2018   AV FISTULA PLACEMENT  03/29/2011   Procedure: ARTERIOVENOUS (AV) FISTULA CREATION;  Surgeon: Hinda Lenis, MD;  Location: [place];  Service: Vascular;  Laterality: Left;  LEFT RADIOCEPHALIC , Arteriovenous [id number])   CARDIAC CATHETERIZATION  05/21/11   CARDIAC CATHETERIZATION N/A 03/16/2016   Procedure: Left Heart Cath and Coronary Angiography;  Surgeon: Dixie Dials, MD;  Location: [facility];  Service: Cardiovascular;  Laterality: N/A;   CHOLECYSTECTOMY N/A 02/12/2016   Procedure: LAPAROSCOPIC CHOLECYSTECTOMY WITH INTRAOPERATIVE CHOLANGIOGRAM;  Surgeon: Jackolyn Confer, MD;  Location: [place];  Service: General;  Laterality: N/A;   COLONOSCOPY WITH PROPOFOL N/A 02/01/2017   Procedure: COLONOSCOPY WITH PROPOFOL;  Surgeon: Juanita Craver, MD;  Location: [facility];  Service: Endoscopy;  Laterality: N/A;   dialysis cath placed     ESOPHAGOGASTRODUODENOSCOPY (EGD) WITH PROPOFOL N/A 12/02/2015   Procedure: ESOPHAGOGASTRODUODENOSCOPY (EGD) WITH PROPOFOL;  Surgeon: Juanita Craver, MD;  [facility];  Service: Endoscopy;  Laterality: N/A;   EVALUATION UNDER ANESTHESIA WITH HEMORRHOIDECTOMY N/A 08/15/2018   Procedure: HEMORRHOID LIGATION/PEXY;  Surgeon: Michael Boston, MD;  Location: [place];  Service: General;  Laterality: N/A;   FINGER SURGERY     L pinkie finger- ORIF- /w remaining hardware - 1990's     FISTULOTOMY N/A 08/15/2018   Procedure: SUPERFICIAL ANAL FISTULOTOMY;  Surgeon: Michael Boston, MD;  Location: [facility];  Service: General;  Laterality: N/A;   HEMORRHOID SURGERY     HERNIA REPAIR N/A 05/25/5850   Umbilical hernia repair   INSERTION OF DIALYSIS CATHETER N/A 01/27/2016   Procedure: INSERTION OF DIALYSIS CATHETER;  Surgeon: Waynetta Sandy, MD;   Location: [place];  Service: Vascular;  Laterality: N/A;   INSERTION OF MESH N/A 07/04/2012   Procedure: INSERTION OF MESH;  Surgeon: Madilyn Hook, DO;  Location: [facility];  Service: General;  Laterality: N/A;   IR EMBO ART  VEN HEMORR LYMPH EXTRAV  INC GUIDE ROADMAPPING  01/13/2018   IR FLUORO GUIDE CV LINE RIGHT  01/13/2018   IR IVC FILTER PLMT / S&I /IMG GUID/MOD SED  01/27/2018   IR RENAL SELECTIVE  UNI INC S&I MOD SED  01/13/2018  IR US GUIDE VASC ACCESS RIGHT  01/13/2018   KIDNEY CYST REMOVAL  2019   LAPAROSCOPIC LYSIS OF ADHESIONS N/A 02/12/2016   Procedure: LAPAROSCOPIC LYSIS OF ADHESIONS;  Surgeon: Jackolyn Confer, MD;  Location: [place];  Service: General;  Laterality: N/A;   LEFT AND RIGHT HEART CATHETERIZATION WITH CORONARY ANGIOGRAM N/A 08/04/2012   Procedure: LEFT AND RIGHT HEART CATHETERIZATION WITH CORONARY ANGIOGRAM;  Surgeon: Birdie Riddle, MD;  Location: [facility];  Service: Cardiovascular;  Laterality: N/A;   NEPHRECTOMY  2000   right   REVISON OF ARTERIOVENOUS FISTULA Left 06/05/2013   Procedure: REVISON OF LEFT RADIOCEPHALIC  ARTERIOVENOUS FISTULA;  Surgeon: Conrad Craig, MD;  Location: [place];  Service: Vascular;  Laterality: Left;   REVISON OF ARTERIOVENOUS FISTULA Left 01/27/2016   Procedure: REVISION OF LEFT UPPER EXTREMITY ARTERIOVENOUS FISTULA;  Surgeon: Waynetta Sandy, MD;  Location: [place];  Service: Vascular;  Laterality: Left;   REVISON OF ARTERIOVENOUS FISTULA Left 08/03/2016   Procedure: REVISON OF Left arm ARTERIOVENOUS FISTULA;  Surgeon: Elam Dutch, MD;  Location: [place];  Service: Vascular;  Laterality: Left;   REVISON OF ARTERIOVENOUS FISTULA Left 05/06/2017   Procedure: REVISION PLICATION OF ARTERIOVENOUS FISTULA LEFT ARM;  Surgeon: Rosetta Posner, MD;  Location: [facility];  Service: Vascular;  Laterality: Left;   RIGHT HEART CATHETERIZATION N/A 05/21/2011   Procedure: RIGHT HEART CATH;  Surgeon: Birdie Riddle, MD;  Location: [facility];   Service: Cardiovascular;  Laterality: N/A;   smashed  1990's   "left pinky; have a plate in there"   UMBILICAL HERNIA REPAIR N/A 07/04/2012   Procedure: LAPAROSCOPIC UMBILICAL HERNIA;  Surgeon: Madilyn Hook, DO;  Location: [facility];  Service: General;  Laterality: N/A;   US ECHOCARDIOGRAPHY  05/20/11    Social History   Socioeconomic History   Marital status: Married    Spouse name: Not on file   Number of children: Not on file   Years of education: Not on file   Highest education level: Not on file  Occupational History   Not on file  Social Needs   Financial resource strain: Not on file   Food insecurity:    Worry: Not on file    Inability: Not on file   Transportation needs:    Medical: Not on file    Non-medical: Not on file  Tobacco Use   Smoking status: Never Smoker   Smokeless tobacco: Never Used  Substance and Sexual Activity   Alcohol use: No    Alcohol/week: 0.0 standard drinks   Drug use: No    Types: Cocaine    Comment: Past hx-none in 20 yrs   Sexual activity: Yes  Lifestyle   Physical activity:    Days per week: Not on file    Minutes per session: Not on file   Stress: Not on file  Relationships   Social connections:    Talks on phone: Not on file    Gets together: Not on file    Attends religious service: Not on file    Active member of club or organization: Not on file    Attends meetings of clubs or organizations: Not on file    Relationship status: Not on file   Intimate partner violence:    Fear of current or ex partner: Not on file    Emotionally abused: Not on file    Physically abused: Not on file    Forced sexual activity: Not on file  Other Topics Concern   Not on file  Social History Narrative   Not on file    Family History  Problem Relation Age of Onset   Hypertension Mother    Kidney disease Mother     Current Facility-Administered Medications  Medication Dose Route Frequency Provider Last Rate Last  Dose   0.9 %  sodium chloride infusion   Intravenous Continuous Michael Boston, MD 10 mL/hr at 08/15/18 1132     0.9 %  sodium chloride infusion  250 mL Intravenous PRN Michael Boston, MD       acetaminophen (TYLENOL) tablet 1,000 mg  1,000 mg Oral Tor Netters, MD   1,000 mg at 08/16/18 0544   albumin human 5 % solution 12.5 g  12.5 g Intravenous Q6H PRN Michael Boston, MD       allopurinol (ZYLOPRIM) tablet 150 mg  150 mg Oral Carlis Abbott, MD   150 mg at 08/15/18 1741   alum & mag hydroxide-simeth (MAALOX/MYLANTA) 200-200-20 MG/5ML suspension 30 mL  30 mL Oral Q6H PRN Michael Boston, MD       amiodarone (PACERONE) tablet 100 mg  100 mg Oral Daily Michael Boston, MD   100 mg at 08/15/18 1741   bisacodyl (DULCOLAX) suppository 10 mg  10 mg Rectal Daily PRN Michael Boston, MD       calcium acetate (PHOSLO) capsule 2,001 mg  2,001 mg Oral TID WC Michael Boston, MD   2,001 mg at 08/15/18 1801   calcium acetate (PHOSLO) capsule 667 mg  667 mg Oral PRN Michael Boston, MD       Chlorhexidine Gluconate Cloth 2 % PADS 6 each  6 each Topical Q0600 Alric Seton, PA-C   6 each at 08/16/18 0545   diphenhydrAMINE (BENADRYL) 12.5 MG/5ML elixir 12.5 mg  12.5 mg Oral Q6H PRN Michael Boston, MD       Or   diphenhydrAMINE (BENADRYL) injection 12.5 mg  12.5 mg Intravenous Q6H PRN Michael Boston, MD       doxercalciferol (HECTOROL) injection 4 mcg  4 mcg Intravenous Q M,W,F-HD Alric Seton, PA-C       feeding supplement (PRO-STAT SUGAR FREE 64) liquid 30 mL  30 mL Oral BID Michael Boston, MD       ferric citrate (AURYXIA) tablet 210 mg  210 mg Oral TID WC Michael Boston, MD   210 mg at 08/15/18 1801   gabapentin (NEURONTIN) capsule 300 mg  300 mg Oral BID Michael Boston, MD   300 mg at 08/15/18 2110   guaiFENesin-dextromethorphan (ROBITUSSIN DM) 100-10 MG/5ML syrup 10 mL  10 mL Oral Q4H PRN Michael Boston, MD       heparin injection 5,000 Units  5,000 Units Subcutaneous Tor Netters, MD    5,000 Units at 08/16/18 0544   hydrALAZINE (APRESOLINE) injection 5-20 mg  5-20 mg Intravenous Q4H PRN Michael Boston, MD       hydrocortisone (ANUSOL-HC) 2.5 % rectal cream 1 application  1 application Topical QID PRN Michael Boston, MD       hydrocortisone cream 1 % 1 application  1 application Topical TID PRN Michael Boston, MD       HYDROmorphone (DILAUDID) injection 0.5-2 mg  0.5-2 mg Intravenous Q2H PRN Michael Boston, MD   1 mg at 08/15/18 2116   isosorbide dinitrate (ISORDIL) tablet 20 mg  20 mg Oral BID Michael Boston, MD   Stopped at 08/15/18 2112   lip balm (CARMEX) ointment 1 application  1 application Topical  BID Michael Boston, MD   1 application at 11/16/92 1744   loperamide (IMODIUM) capsule 2 mg  2 mg Oral Ardeen Fillers, MD   2 mg at 08/15/18 2116   loperamide (IMODIUM) capsule 2-4 mg  2-4 mg Oral Q8H PRN Michael Boston, MD       magic mouthwash  15 mL Oral QID PRN Michael Boston, MD       meclizine (ANTIVERT) tablet 25 mg  25 mg Oral TID PRN Michael Boston, MD       menthol-cetylpyridinium (CEPACOL) lozenge 3 mg  1 lozenge Oral PRN Michael Boston, MD       methocarbamol (ROBAXIN) tablet 750 mg  750 mg Oral Q6H PRN Michael Boston, MD   750 mg at 08/16/18 0203   metoprolol tartrate (LOPRESSOR) injection 5 mg  5 mg Intravenous Q6H PRN Michael Boston, MD       Derrill Memo ON 08/17/2018] midodrine (PROAMATINE) tablet 10 mg  10 mg Oral Once per day on Sun Tue Thu Sat Michael Boston, MD       midodrine (PROAMATINE) tablet 15 mg  15 mg Oral Once per day on Mon Wed Fri Vernita Tague, MD   15 mg at 08/16/18 0747   mirtazapine (REMERON) tablet 7.5 mg  7.5 mg Oral Ardeen Fillers, MD   7.5 mg at 08/15/18 2111   multivitamin (RENA-VIT) tablet 1 tablet  1 tablet Oral Ardeen Fillers, MD   1 tablet at 08/15/18 2112   nitroGLYCERIN (NITROSTAT) SL tablet 0.4 mg  0.4 mg Sublingual Q5 min PRN Michael Boston, MD       ondansetron (ZOFRAN-ODT) disintegrating tablet 4 mg  4 mg Oral Q6H PRN  Michael Boston, MD       Or   ondansetron [facility]) injection 4 mg  4 mg Intravenous Q6H PRN Michael Boston, MD       oxyCODONE (Oxy IR/ROXICODONE) immediate release tablet 5-10 mg  5-10 mg Oral Q4H PRN Michael Boston, MD   10 mg at 08/16/18 0203   pantoprazole (PROTONIX) EC tablet 40 mg  40 mg Oral BID Michael Boston, MD   40 mg at 08/15/18 2112   phenol (CHLORASEPTIC) mouth spray 1-2 spray  1-2 spray Mouth/Throat PRN Michael Boston, MD       polyethylene glycol (MIRALAX / GLYCOLAX) packet 17 g  17 g Oral Daily PRN Michael Boston, MD       prochlorperazine (COMPAZINE) tablet 10 mg  10 mg Oral Q6H PRN Michael Boston, MD       Or   prochlorperazine (COMPAZINE) injection 5-10 mg  5-10 mg Intravenous Q6H PRN Michael Boston, MD       simethicone (MYLICON) chewable tablet 40 mg  40 mg Oral Q6H PRN Michael Boston, MD       sodium chloride flush (NS) 0.9 % injection 3 mL  3 mL Intravenous Gorden Harms, MD       sodium chloride flush (NS) 0.9 % injection 3 mL  3 mL Intravenous PRN Michael Boston, MD         Allergies  Allergen Reactions   Ace Inhibitors Itching and Cough    Signed: Morton Peters, MD, FACS, MASCRS [facility]    [address] [place], [place] [address] [phone] Main / Paging [phone] Fax   08/16/2018, 8:06 AM

## 2018-08-17 LAB — HEPATITIS B SURFACE ANTIGEN: Hepatitis B Surface Ag: NEGATIVE

## 2018-08-18 DIAGNOSIS — N2581 Secondary hyperparathyroidism of renal origin: Secondary | ICD-10-CM | POA: Diagnosis not present

## 2018-08-18 DIAGNOSIS — E1129 Type 2 diabetes mellitus with other diabetic kidney complication: Secondary | ICD-10-CM | POA: Diagnosis not present

## 2018-08-18 DIAGNOSIS — N186 End stage renal disease: Secondary | ICD-10-CM | POA: Diagnosis not present

## 2018-08-18 DIAGNOSIS — D631 Anemia in chronic kidney disease: Secondary | ICD-10-CM | POA: Diagnosis not present

## 2018-08-18 DIAGNOSIS — I4892 Unspecified atrial flutter: Secondary | ICD-10-CM | POA: Diagnosis not present

## 2018-08-19 LAB — PROTIME-INR: INR: 1 (ref 0.9–1.1)

## 2018-08-21 DIAGNOSIS — E1129 Type 2 diabetes mellitus with other diabetic kidney complication: Secondary | ICD-10-CM | POA: Diagnosis not present

## 2018-08-21 DIAGNOSIS — D631 Anemia in chronic kidney disease: Secondary | ICD-10-CM | POA: Diagnosis not present

## 2018-08-21 DIAGNOSIS — N2581 Secondary hyperparathyroidism of renal origin: Secondary | ICD-10-CM | POA: Diagnosis not present

## 2018-08-21 DIAGNOSIS — N186 End stage renal disease: Secondary | ICD-10-CM | POA: Diagnosis not present

## 2018-08-23 DIAGNOSIS — E1129 Type 2 diabetes mellitus with other diabetic kidney complication: Secondary | ICD-10-CM | POA: Diagnosis not present

## 2018-08-23 DIAGNOSIS — N2581 Secondary hyperparathyroidism of renal origin: Secondary | ICD-10-CM | POA: Diagnosis not present

## 2018-08-23 DIAGNOSIS — N186 End stage renal disease: Secondary | ICD-10-CM | POA: Diagnosis not present

## 2018-08-23 DIAGNOSIS — D689 Coagulation defect, unspecified: Secondary | ICD-10-CM | POA: Diagnosis not present

## 2018-08-23 DIAGNOSIS — Z992 Dependence on renal dialysis: Secondary | ICD-10-CM | POA: Diagnosis not present

## 2018-08-23 DIAGNOSIS — I120 Hypertensive chronic kidney disease with stage 5 chronic kidney disease or end stage renal disease: Secondary | ICD-10-CM | POA: Diagnosis not present

## 2018-08-23 DIAGNOSIS — D631 Anemia in chronic kidney disease: Secondary | ICD-10-CM | POA: Diagnosis not present

## 2018-08-24 LAB — PROTIME-INR: INR: 1.4 — AB (ref 0.9–1.1)

## 2018-08-25 DIAGNOSIS — E1129 Type 2 diabetes mellitus with other diabetic kidney complication: Secondary | ICD-10-CM | POA: Diagnosis not present

## 2018-08-25 DIAGNOSIS — N186 End stage renal disease: Secondary | ICD-10-CM | POA: Diagnosis not present

## 2018-08-25 DIAGNOSIS — N2581 Secondary hyperparathyroidism of renal origin: Secondary | ICD-10-CM | POA: Diagnosis not present

## 2018-08-25 DIAGNOSIS — D631 Anemia in chronic kidney disease: Secondary | ICD-10-CM | POA: Diagnosis not present

## 2018-08-28 DIAGNOSIS — E1129 Type 2 diabetes mellitus with other diabetic kidney complication: Secondary | ICD-10-CM | POA: Diagnosis not present

## 2018-08-28 DIAGNOSIS — N2581 Secondary hyperparathyroidism of renal origin: Secondary | ICD-10-CM | POA: Diagnosis not present

## 2018-08-28 DIAGNOSIS — D631 Anemia in chronic kidney disease: Secondary | ICD-10-CM | POA: Diagnosis not present

## 2018-08-28 DIAGNOSIS — N186 End stage renal disease: Secondary | ICD-10-CM | POA: Diagnosis not present

## 2018-08-30 DIAGNOSIS — D631 Anemia in chronic kidney disease: Secondary | ICD-10-CM | POA: Diagnosis not present

## 2018-08-30 DIAGNOSIS — E1129 Type 2 diabetes mellitus with other diabetic kidney complication: Secondary | ICD-10-CM | POA: Diagnosis not present

## 2018-08-30 DIAGNOSIS — D689 Coagulation defect, unspecified: Secondary | ICD-10-CM | POA: Diagnosis not present

## 2018-08-30 DIAGNOSIS — N186 End stage renal disease: Secondary | ICD-10-CM | POA: Diagnosis not present

## 2018-08-30 DIAGNOSIS — N2581 Secondary hyperparathyroidism of renal origin: Secondary | ICD-10-CM | POA: Diagnosis not present

## 2018-08-31 LAB — PROTIME-INR: INR: 2.2 — AB (ref 0.9–1.1)

## 2018-09-01 DIAGNOSIS — E1129 Type 2 diabetes mellitus with other diabetic kidney complication: Secondary | ICD-10-CM | POA: Diagnosis not present

## 2018-09-01 DIAGNOSIS — N2581 Secondary hyperparathyroidism of renal origin: Secondary | ICD-10-CM | POA: Diagnosis not present

## 2018-09-01 DIAGNOSIS — N186 End stage renal disease: Secondary | ICD-10-CM | POA: Diagnosis not present

## 2018-09-01 DIAGNOSIS — D631 Anemia in chronic kidney disease: Secondary | ICD-10-CM | POA: Diagnosis not present

## 2018-09-04 DIAGNOSIS — E1129 Type 2 diabetes mellitus with other diabetic kidney complication: Secondary | ICD-10-CM | POA: Diagnosis not present

## 2018-09-04 DIAGNOSIS — D631 Anemia in chronic kidney disease: Secondary | ICD-10-CM | POA: Diagnosis not present

## 2018-09-04 DIAGNOSIS — N2581 Secondary hyperparathyroidism of renal origin: Secondary | ICD-10-CM | POA: Diagnosis not present

## 2018-09-04 DIAGNOSIS — N186 End stage renal disease: Secondary | ICD-10-CM | POA: Diagnosis not present

## 2018-09-06 DIAGNOSIS — N186 End stage renal disease: Secondary | ICD-10-CM | POA: Diagnosis not present

## 2018-09-06 DIAGNOSIS — E1129 Type 2 diabetes mellitus with other diabetic kidney complication: Secondary | ICD-10-CM | POA: Diagnosis not present

## 2018-09-06 DIAGNOSIS — D631 Anemia in chronic kidney disease: Secondary | ICD-10-CM | POA: Diagnosis not present

## 2018-09-06 DIAGNOSIS — D689 Coagulation defect, unspecified: Secondary | ICD-10-CM | POA: Diagnosis not present

## 2018-09-06 DIAGNOSIS — N2581 Secondary hyperparathyroidism of renal origin: Secondary | ICD-10-CM | POA: Diagnosis not present

## 2018-09-06 LAB — PROTIME-INR: Protime: 20.3 — AB (ref 10.0–13.8)

## 2018-09-06 LAB — POCT INR: INR: 1.8 — AB (ref 0.9–1.1)

## 2018-09-08 ENCOUNTER — Telehealth: Payer: Self-pay

## 2018-09-08 DIAGNOSIS — I5022 Chronic systolic (congestive) heart failure: Secondary | ICD-10-CM | POA: Diagnosis not present

## 2018-09-08 DIAGNOSIS — E1129 Type 2 diabetes mellitus with other diabetic kidney complication: Secondary | ICD-10-CM | POA: Diagnosis not present

## 2018-09-08 DIAGNOSIS — N2581 Secondary hyperparathyroidism of renal origin: Secondary | ICD-10-CM | POA: Diagnosis not present

## 2018-09-08 DIAGNOSIS — I2510 Atherosclerotic heart disease of native coronary artery without angina pectoris: Secondary | ICD-10-CM | POA: Diagnosis not present

## 2018-09-08 DIAGNOSIS — N186 End stage renal disease: Secondary | ICD-10-CM | POA: Diagnosis not present

## 2018-09-08 DIAGNOSIS — I120 Hypertensive chronic kidney disease with stage 5 chronic kidney disease or end stage renal disease: Secondary | ICD-10-CM | POA: Diagnosis not present

## 2018-09-08 DIAGNOSIS — D631 Anemia in chronic kidney disease: Secondary | ICD-10-CM | POA: Diagnosis not present

## 2018-09-08 DIAGNOSIS — Z7901 Long term (current) use of anticoagulants: Secondary | ICD-10-CM | POA: Diagnosis not present

## 2018-09-08 NOTE — Telephone Encounter (Signed)
Called to see who mananges pt pt-inr. It is managed by his heart dr

## 2018-09-11 ENCOUNTER — Telehealth: Payer: Self-pay

## 2018-09-11 DIAGNOSIS — E1129 Type 2 diabetes mellitus with other diabetic kidney complication: Secondary | ICD-10-CM | POA: Diagnosis not present

## 2018-09-11 DIAGNOSIS — N186 End stage renal disease: Secondary | ICD-10-CM | POA: Diagnosis not present

## 2018-09-11 DIAGNOSIS — D631 Anemia in chronic kidney disease: Secondary | ICD-10-CM | POA: Diagnosis not present

## 2018-09-11 DIAGNOSIS — N2581 Secondary hyperparathyroidism of renal origin: Secondary | ICD-10-CM | POA: Diagnosis not present

## 2018-09-11 NOTE — Telephone Encounter (Signed)
Attempted to contact pt to see why is taking Coumadin? I was unable to leave v/m due to it not being set up. YRL,RMA

## 2018-09-12 ENCOUNTER — Encounter: Payer: Self-pay | Admitting: Internal Medicine

## 2018-09-12 ENCOUNTER — Telehealth: Payer: Self-pay

## 2018-09-12 NOTE — Telephone Encounter (Signed)
Attempted to call pt twice unable to leave v/m on his number so I called wife and left her a v/m to have pt call us. I wanted to ask him why is taking coumadin per Dr.Sanders. YRL,RMA

## 2018-09-13 DIAGNOSIS — E1129 Type 2 diabetes mellitus with other diabetic kidney complication: Secondary | ICD-10-CM | POA: Diagnosis not present

## 2018-09-13 DIAGNOSIS — D631 Anemia in chronic kidney disease: Secondary | ICD-10-CM | POA: Diagnosis not present

## 2018-09-13 DIAGNOSIS — N2581 Secondary hyperparathyroidism of renal origin: Secondary | ICD-10-CM | POA: Diagnosis not present

## 2018-09-13 DIAGNOSIS — D689 Coagulation defect, unspecified: Secondary | ICD-10-CM | POA: Diagnosis not present

## 2018-09-13 DIAGNOSIS — N186 End stage renal disease: Secondary | ICD-10-CM | POA: Diagnosis not present

## 2018-09-14 ENCOUNTER — Telehealth: Payer: Self-pay

## 2018-09-14 LAB — PROTIME-INR: INR: 2 — AB (ref 0.9–1.1)

## 2018-09-14 NOTE — Telephone Encounter (Signed)
Patient stated he is taking the coumadin for his heart. He said his heart doctor put him on it he doesn't know how to spell doctors name nor the name of the practice he just said the practice is across from [place]. Lonia Mad

## 2018-09-15 DIAGNOSIS — E1129 Type 2 diabetes mellitus with other diabetic kidney complication: Secondary | ICD-10-CM | POA: Diagnosis not present

## 2018-09-15 DIAGNOSIS — N186 End stage renal disease: Secondary | ICD-10-CM | POA: Diagnosis not present

## 2018-09-15 DIAGNOSIS — N2581 Secondary hyperparathyroidism of renal origin: Secondary | ICD-10-CM | POA: Diagnosis not present

## 2018-09-15 DIAGNOSIS — D631 Anemia in chronic kidney disease: Secondary | ICD-10-CM | POA: Diagnosis not present

## 2018-09-18 DIAGNOSIS — E1129 Type 2 diabetes mellitus with other diabetic kidney complication: Secondary | ICD-10-CM | POA: Diagnosis not present

## 2018-09-18 DIAGNOSIS — N186 End stage renal disease: Secondary | ICD-10-CM | POA: Diagnosis not present

## 2018-09-18 DIAGNOSIS — D631 Anemia in chronic kidney disease: Secondary | ICD-10-CM | POA: Diagnosis not present

## 2018-09-18 DIAGNOSIS — N2581 Secondary hyperparathyroidism of renal origin: Secondary | ICD-10-CM | POA: Diagnosis not present

## 2018-09-20 DIAGNOSIS — E1129 Type 2 diabetes mellitus with other diabetic kidney complication: Secondary | ICD-10-CM | POA: Diagnosis not present

## 2018-09-20 DIAGNOSIS — N186 End stage renal disease: Secondary | ICD-10-CM | POA: Diagnosis not present

## 2018-09-20 DIAGNOSIS — D689 Coagulation defect, unspecified: Secondary | ICD-10-CM | POA: Diagnosis not present

## 2018-09-20 DIAGNOSIS — N2581 Secondary hyperparathyroidism of renal origin: Secondary | ICD-10-CM | POA: Diagnosis not present

## 2018-09-20 DIAGNOSIS — D631 Anemia in chronic kidney disease: Secondary | ICD-10-CM | POA: Diagnosis not present

## 2018-09-21 ENCOUNTER — Telehealth: Payer: Self-pay

## 2018-09-21 LAB — PROTIME-INR: INR: 2.2 — AB (ref 0.9–1.1)

## 2018-09-21 NOTE — Telephone Encounter (Signed)
I called pt to see if he is interested in having his INR check and he stated he gets it checked every Wednesday at dialysis I asked him to have them give [place] a call and let us know what it is each time it is checked. YRL,RMA

## 2018-09-22 DIAGNOSIS — I120 Hypertensive chronic kidney disease with stage 5 chronic kidney disease or end stage renal disease: Secondary | ICD-10-CM | POA: Diagnosis not present

## 2018-09-22 DIAGNOSIS — E1129 Type 2 diabetes mellitus with other diabetic kidney complication: Secondary | ICD-10-CM | POA: Diagnosis not present

## 2018-09-22 DIAGNOSIS — N2581 Secondary hyperparathyroidism of renal origin: Secondary | ICD-10-CM | POA: Diagnosis not present

## 2018-09-22 DIAGNOSIS — Z992 Dependence on renal dialysis: Secondary | ICD-10-CM | POA: Diagnosis not present

## 2018-09-22 DIAGNOSIS — D631 Anemia in chronic kidney disease: Secondary | ICD-10-CM | POA: Diagnosis not present

## 2018-09-22 DIAGNOSIS — N186 End stage renal disease: Secondary | ICD-10-CM | POA: Diagnosis not present

## 2018-09-25 DIAGNOSIS — D631 Anemia in chronic kidney disease: Secondary | ICD-10-CM | POA: Diagnosis not present

## 2018-09-25 DIAGNOSIS — Z992 Dependence on renal dialysis: Secondary | ICD-10-CM | POA: Diagnosis not present

## 2018-09-25 DIAGNOSIS — N2581 Secondary hyperparathyroidism of renal origin: Secondary | ICD-10-CM | POA: Diagnosis not present

## 2018-09-25 DIAGNOSIS — E1129 Type 2 diabetes mellitus with other diabetic kidney complication: Secondary | ICD-10-CM | POA: Diagnosis not present

## 2018-09-25 DIAGNOSIS — N186 End stage renal disease: Secondary | ICD-10-CM | POA: Diagnosis not present

## 2018-09-27 DIAGNOSIS — E1129 Type 2 diabetes mellitus with other diabetic kidney complication: Secondary | ICD-10-CM | POA: Diagnosis not present

## 2018-09-27 DIAGNOSIS — N186 End stage renal disease: Secondary | ICD-10-CM | POA: Diagnosis not present

## 2018-09-27 DIAGNOSIS — D631 Anemia in chronic kidney disease: Secondary | ICD-10-CM | POA: Diagnosis not present

## 2018-09-27 DIAGNOSIS — Z992 Dependence on renal dialysis: Secondary | ICD-10-CM | POA: Diagnosis not present

## 2018-09-27 DIAGNOSIS — N2581 Secondary hyperparathyroidism of renal origin: Secondary | ICD-10-CM | POA: Diagnosis not present

## 2018-09-27 DIAGNOSIS — D689 Coagulation defect, unspecified: Secondary | ICD-10-CM | POA: Diagnosis not present

## 2018-09-29 ENCOUNTER — Encounter: Payer: Self-pay | Admitting: Internal Medicine

## 2018-09-29 DIAGNOSIS — Z992 Dependence on renal dialysis: Secondary | ICD-10-CM | POA: Diagnosis not present

## 2018-09-29 DIAGNOSIS — N2581 Secondary hyperparathyroidism of renal origin: Secondary | ICD-10-CM | POA: Diagnosis not present

## 2018-09-29 DIAGNOSIS — N186 End stage renal disease: Secondary | ICD-10-CM | POA: Diagnosis not present

## 2018-09-29 DIAGNOSIS — E1129 Type 2 diabetes mellitus with other diabetic kidney complication: Secondary | ICD-10-CM | POA: Diagnosis not present

## 2018-09-29 DIAGNOSIS — D631 Anemia in chronic kidney disease: Secondary | ICD-10-CM | POA: Diagnosis not present

## 2018-10-02 DIAGNOSIS — Z992 Dependence on renal dialysis: Secondary | ICD-10-CM | POA: Diagnosis not present

## 2018-10-02 DIAGNOSIS — D631 Anemia in chronic kidney disease: Secondary | ICD-10-CM | POA: Diagnosis not present

## 2018-10-02 DIAGNOSIS — N186 End stage renal disease: Secondary | ICD-10-CM | POA: Diagnosis not present

## 2018-10-02 DIAGNOSIS — E1129 Type 2 diabetes mellitus with other diabetic kidney complication: Secondary | ICD-10-CM | POA: Diagnosis not present

## 2018-10-02 DIAGNOSIS — N2581 Secondary hyperparathyroidism of renal origin: Secondary | ICD-10-CM | POA: Diagnosis not present

## 2018-10-03 ENCOUNTER — Telehealth: Payer: Self-pay

## 2018-10-03 ENCOUNTER — Encounter: Payer: Self-pay | Admitting: Internal Medicine

## 2018-10-03 NOTE — Telephone Encounter (Signed)
The pt told that Dr. Baird Cancer wants to know if his cardiologist is following his coumadin and the pt said yes.  The pt said thaht his cardiologist told him to skip last Tuesday or Wednesday but that he missed taking his does on last Thursday.  The pt was asked to call back with the Dr.'s information or to send it in his MyChart.

## 2018-10-04 DIAGNOSIS — N186 End stage renal disease: Secondary | ICD-10-CM | POA: Diagnosis not present

## 2018-10-04 DIAGNOSIS — E1129 Type 2 diabetes mellitus with other diabetic kidney complication: Secondary | ICD-10-CM | POA: Diagnosis not present

## 2018-10-04 DIAGNOSIS — Z992 Dependence on renal dialysis: Secondary | ICD-10-CM | POA: Diagnosis not present

## 2018-10-04 DIAGNOSIS — N2581 Secondary hyperparathyroidism of renal origin: Secondary | ICD-10-CM | POA: Diagnosis not present

## 2018-10-04 DIAGNOSIS — D631 Anemia in chronic kidney disease: Secondary | ICD-10-CM | POA: Diagnosis not present

## 2018-10-04 DIAGNOSIS — D689 Coagulation defect, unspecified: Secondary | ICD-10-CM | POA: Diagnosis not present

## 2018-10-04 LAB — POCT INR: INR: 2.5 — AB (ref 0.9–1.1)

## 2018-10-05 ENCOUNTER — Encounter: Payer: Self-pay | Admitting: Internal Medicine

## 2018-10-06 DIAGNOSIS — Z992 Dependence on renal dialysis: Secondary | ICD-10-CM | POA: Diagnosis not present

## 2018-10-06 DIAGNOSIS — N186 End stage renal disease: Secondary | ICD-10-CM | POA: Diagnosis not present

## 2018-10-06 DIAGNOSIS — E1129 Type 2 diabetes mellitus with other diabetic kidney complication: Secondary | ICD-10-CM | POA: Diagnosis not present

## 2018-10-06 DIAGNOSIS — N2581 Secondary hyperparathyroidism of renal origin: Secondary | ICD-10-CM | POA: Diagnosis not present

## 2018-10-06 DIAGNOSIS — D631 Anemia in chronic kidney disease: Secondary | ICD-10-CM | POA: Diagnosis not present

## 2018-10-09 DIAGNOSIS — Z992 Dependence on renal dialysis: Secondary | ICD-10-CM | POA: Diagnosis not present

## 2018-10-09 DIAGNOSIS — E1129 Type 2 diabetes mellitus with other diabetic kidney complication: Secondary | ICD-10-CM | POA: Diagnosis not present

## 2018-10-09 DIAGNOSIS — N2581 Secondary hyperparathyroidism of renal origin: Secondary | ICD-10-CM | POA: Diagnosis not present

## 2018-10-09 DIAGNOSIS — N186 End stage renal disease: Secondary | ICD-10-CM | POA: Diagnosis not present

## 2018-10-09 DIAGNOSIS — D631 Anemia in chronic kidney disease: Secondary | ICD-10-CM | POA: Diagnosis not present

## 2018-10-11 ENCOUNTER — Encounter: Payer: Self-pay | Admitting: Internal Medicine

## 2018-10-11 DIAGNOSIS — Z992 Dependence on renal dialysis: Secondary | ICD-10-CM | POA: Diagnosis not present

## 2018-10-11 DIAGNOSIS — N186 End stage renal disease: Secondary | ICD-10-CM | POA: Diagnosis not present

## 2018-10-11 DIAGNOSIS — N2581 Secondary hyperparathyroidism of renal origin: Secondary | ICD-10-CM | POA: Diagnosis not present

## 2018-10-11 DIAGNOSIS — D631 Anemia in chronic kidney disease: Secondary | ICD-10-CM | POA: Diagnosis not present

## 2018-10-11 DIAGNOSIS — E1129 Type 2 diabetes mellitus with other diabetic kidney complication: Secondary | ICD-10-CM | POA: Diagnosis not present

## 2018-10-13 DIAGNOSIS — N186 End stage renal disease: Secondary | ICD-10-CM | POA: Diagnosis not present

## 2018-10-13 DIAGNOSIS — E1129 Type 2 diabetes mellitus with other diabetic kidney complication: Secondary | ICD-10-CM | POA: Diagnosis not present

## 2018-10-13 DIAGNOSIS — N2581 Secondary hyperparathyroidism of renal origin: Secondary | ICD-10-CM | POA: Diagnosis not present

## 2018-10-13 DIAGNOSIS — Z992 Dependence on renal dialysis: Secondary | ICD-10-CM | POA: Diagnosis not present

## 2018-10-13 DIAGNOSIS — D631 Anemia in chronic kidney disease: Secondary | ICD-10-CM | POA: Diagnosis not present

## 2018-10-16 DIAGNOSIS — N186 End stage renal disease: Secondary | ICD-10-CM | POA: Diagnosis not present

## 2018-10-16 DIAGNOSIS — N2581 Secondary hyperparathyroidism of renal origin: Secondary | ICD-10-CM | POA: Diagnosis not present

## 2018-10-16 DIAGNOSIS — E1129 Type 2 diabetes mellitus with other diabetic kidney complication: Secondary | ICD-10-CM | POA: Diagnosis not present

## 2018-10-16 DIAGNOSIS — D631 Anemia in chronic kidney disease: Secondary | ICD-10-CM | POA: Diagnosis not present

## 2018-10-16 DIAGNOSIS — Z992 Dependence on renal dialysis: Secondary | ICD-10-CM | POA: Diagnosis not present

## 2018-10-17 ENCOUNTER — Ambulatory Visit: Payer: Medicare Other | Admitting: Internal Medicine

## 2018-10-18 DIAGNOSIS — D631 Anemia in chronic kidney disease: Secondary | ICD-10-CM | POA: Diagnosis not present

## 2018-10-18 DIAGNOSIS — Z992 Dependence on renal dialysis: Secondary | ICD-10-CM | POA: Diagnosis not present

## 2018-10-18 DIAGNOSIS — N2581 Secondary hyperparathyroidism of renal origin: Secondary | ICD-10-CM | POA: Diagnosis not present

## 2018-10-18 DIAGNOSIS — N186 End stage renal disease: Secondary | ICD-10-CM | POA: Diagnosis not present

## 2018-10-18 DIAGNOSIS — E1129 Type 2 diabetes mellitus with other diabetic kidney complication: Secondary | ICD-10-CM | POA: Diagnosis not present

## 2018-10-20 DIAGNOSIS — D631 Anemia in chronic kidney disease: Secondary | ICD-10-CM | POA: Diagnosis not present

## 2018-10-20 DIAGNOSIS — Z992 Dependence on renal dialysis: Secondary | ICD-10-CM | POA: Diagnosis not present

## 2018-10-20 DIAGNOSIS — N186 End stage renal disease: Secondary | ICD-10-CM | POA: Diagnosis not present

## 2018-10-20 DIAGNOSIS — E1129 Type 2 diabetes mellitus with other diabetic kidney complication: Secondary | ICD-10-CM | POA: Diagnosis not present

## 2018-10-20 DIAGNOSIS — N2581 Secondary hyperparathyroidism of renal origin: Secondary | ICD-10-CM | POA: Diagnosis not present

## 2018-10-20 DIAGNOSIS — D689 Coagulation defect, unspecified: Secondary | ICD-10-CM | POA: Diagnosis not present

## 2018-10-21 LAB — PROTIME-INR: INR: 3.4 — AB (ref 0.9–1.1)

## 2018-10-23 DIAGNOSIS — N186 End stage renal disease: Secondary | ICD-10-CM | POA: Diagnosis not present

## 2018-10-23 DIAGNOSIS — D631 Anemia in chronic kidney disease: Secondary | ICD-10-CM | POA: Diagnosis not present

## 2018-10-23 DIAGNOSIS — Z992 Dependence on renal dialysis: Secondary | ICD-10-CM | POA: Diagnosis not present

## 2018-10-23 DIAGNOSIS — I120 Hypertensive chronic kidney disease with stage 5 chronic kidney disease or end stage renal disease: Secondary | ICD-10-CM | POA: Diagnosis not present

## 2018-10-23 DIAGNOSIS — E1129 Type 2 diabetes mellitus with other diabetic kidney complication: Secondary | ICD-10-CM | POA: Diagnosis not present

## 2018-10-23 DIAGNOSIS — N2581 Secondary hyperparathyroidism of renal origin: Secondary | ICD-10-CM | POA: Diagnosis not present

## 2018-10-25 DIAGNOSIS — Z992 Dependence on renal dialysis: Secondary | ICD-10-CM | POA: Diagnosis not present

## 2018-10-25 DIAGNOSIS — D689 Coagulation defect, unspecified: Secondary | ICD-10-CM | POA: Diagnosis not present

## 2018-10-25 DIAGNOSIS — D631 Anemia in chronic kidney disease: Secondary | ICD-10-CM | POA: Diagnosis not present

## 2018-10-25 DIAGNOSIS — N2581 Secondary hyperparathyroidism of renal origin: Secondary | ICD-10-CM | POA: Diagnosis not present

## 2018-10-25 DIAGNOSIS — E1129 Type 2 diabetes mellitus with other diabetic kidney complication: Secondary | ICD-10-CM | POA: Diagnosis not present

## 2018-10-25 DIAGNOSIS — N186 End stage renal disease: Secondary | ICD-10-CM | POA: Diagnosis not present

## 2018-10-27 DIAGNOSIS — N186 End stage renal disease: Secondary | ICD-10-CM | POA: Diagnosis not present

## 2018-10-27 DIAGNOSIS — E1129 Type 2 diabetes mellitus with other diabetic kidney complication: Secondary | ICD-10-CM | POA: Diagnosis not present

## 2018-10-27 DIAGNOSIS — D631 Anemia in chronic kidney disease: Secondary | ICD-10-CM | POA: Diagnosis not present

## 2018-10-27 DIAGNOSIS — Z992 Dependence on renal dialysis: Secondary | ICD-10-CM | POA: Diagnosis not present

## 2018-10-27 DIAGNOSIS — N2581 Secondary hyperparathyroidism of renal origin: Secondary | ICD-10-CM | POA: Diagnosis not present

## 2018-10-30 DIAGNOSIS — N2581 Secondary hyperparathyroidism of renal origin: Secondary | ICD-10-CM | POA: Diagnosis not present

## 2018-10-30 DIAGNOSIS — E1129 Type 2 diabetes mellitus with other diabetic kidney complication: Secondary | ICD-10-CM | POA: Diagnosis not present

## 2018-10-30 DIAGNOSIS — Z992 Dependence on renal dialysis: Secondary | ICD-10-CM | POA: Diagnosis not present

## 2018-10-30 DIAGNOSIS — N186 End stage renal disease: Secondary | ICD-10-CM | POA: Diagnosis not present

## 2018-10-30 DIAGNOSIS — D631 Anemia in chronic kidney disease: Secondary | ICD-10-CM | POA: Diagnosis not present

## 2018-11-01 DIAGNOSIS — N186 End stage renal disease: Secondary | ICD-10-CM | POA: Diagnosis not present

## 2018-11-01 DIAGNOSIS — E1129 Type 2 diabetes mellitus with other diabetic kidney complication: Secondary | ICD-10-CM | POA: Diagnosis not present

## 2018-11-01 DIAGNOSIS — Z992 Dependence on renal dialysis: Secondary | ICD-10-CM | POA: Diagnosis not present

## 2018-11-01 DIAGNOSIS — N2581 Secondary hyperparathyroidism of renal origin: Secondary | ICD-10-CM | POA: Diagnosis not present

## 2018-11-01 DIAGNOSIS — D631 Anemia in chronic kidney disease: Secondary | ICD-10-CM | POA: Diagnosis not present

## 2018-11-01 DIAGNOSIS — D689 Coagulation defect, unspecified: Secondary | ICD-10-CM | POA: Diagnosis not present

## 2018-11-03 DIAGNOSIS — N186 End stage renal disease: Secondary | ICD-10-CM | POA: Diagnosis not present

## 2018-11-03 DIAGNOSIS — E1129 Type 2 diabetes mellitus with other diabetic kidney complication: Secondary | ICD-10-CM | POA: Diagnosis not present

## 2018-11-03 DIAGNOSIS — Z992 Dependence on renal dialysis: Secondary | ICD-10-CM | POA: Diagnosis not present

## 2018-11-03 DIAGNOSIS — D631 Anemia in chronic kidney disease: Secondary | ICD-10-CM | POA: Diagnosis not present

## 2018-11-03 DIAGNOSIS — N2581 Secondary hyperparathyroidism of renal origin: Secondary | ICD-10-CM | POA: Diagnosis not present

## 2018-11-06 DIAGNOSIS — N2581 Secondary hyperparathyroidism of renal origin: Secondary | ICD-10-CM | POA: Diagnosis not present

## 2018-11-06 DIAGNOSIS — Z992 Dependence on renal dialysis: Secondary | ICD-10-CM | POA: Diagnosis not present

## 2018-11-06 DIAGNOSIS — N186 End stage renal disease: Secondary | ICD-10-CM | POA: Diagnosis not present

## 2018-11-06 DIAGNOSIS — E1129 Type 2 diabetes mellitus with other diabetic kidney complication: Secondary | ICD-10-CM | POA: Diagnosis not present

## 2018-11-06 DIAGNOSIS — D631 Anemia in chronic kidney disease: Secondary | ICD-10-CM | POA: Diagnosis not present

## 2018-11-07 ENCOUNTER — Telehealth: Payer: Self-pay

## 2018-11-07 NOTE — Telephone Encounter (Signed)
Returned call to to pt to let him know that the call he received was to inform him of his appt

## 2018-11-08 DIAGNOSIS — D689 Coagulation defect, unspecified: Secondary | ICD-10-CM | POA: Diagnosis not present

## 2018-11-08 DIAGNOSIS — D631 Anemia in chronic kidney disease: Secondary | ICD-10-CM | POA: Diagnosis not present

## 2018-11-08 DIAGNOSIS — E1129 Type 2 diabetes mellitus with other diabetic kidney complication: Secondary | ICD-10-CM | POA: Diagnosis not present

## 2018-11-08 DIAGNOSIS — N2581 Secondary hyperparathyroidism of renal origin: Secondary | ICD-10-CM | POA: Diagnosis not present

## 2018-11-08 DIAGNOSIS — N186 End stage renal disease: Secondary | ICD-10-CM | POA: Diagnosis not present

## 2018-11-08 DIAGNOSIS — Z992 Dependence on renal dialysis: Secondary | ICD-10-CM | POA: Diagnosis not present

## 2018-11-09 ENCOUNTER — Other Ambulatory Visit: Payer: Self-pay

## 2018-11-09 ENCOUNTER — Ambulatory Visit
Admission: RE | Admit: 2018-11-09 | Discharge: 2018-11-09 | Disposition: A | Discharge status: Home / Self Care | Payer: Medicare Other | Source: Ambulatory Visit | Attending: Internal Medicine | Admitting: Internal Medicine

## 2018-11-09 ENCOUNTER — Encounter: Payer: Self-pay | Admitting: Internal Medicine

## 2018-11-09 ENCOUNTER — Ambulatory Visit (INDEPENDENT_AMBULATORY_CARE_PROVIDER_SITE_OTHER): Payer: Medicare Other

## 2018-11-09 ENCOUNTER — Ambulatory Visit (INDEPENDENT_AMBULATORY_CARE_PROVIDER_SITE_OTHER): Payer: Medicare Other | Admitting: Internal Medicine

## 2018-11-09 VITALS — BP 90/70 | HR 102 | Temp 98.2°F | Ht 69.2 in | Wt 244.4 lb

## 2018-11-09 DIAGNOSIS — I10 Essential (primary) hypertension: Secondary | ICD-10-CM

## 2018-11-09 DIAGNOSIS — Z0000 Encounter for general adult medical examination without abnormal findings: Secondary | ICD-10-CM | POA: Diagnosis not present

## 2018-11-09 DIAGNOSIS — E1121 Type 2 diabetes mellitus with diabetic nephropathy: Secondary | ICD-10-CM | POA: Diagnosis not present

## 2018-11-09 DIAGNOSIS — I132 Hypertensive heart and chronic kidney disease with heart failure and with stage 5 chronic kidney disease, or end stage renal disease: Secondary | ICD-10-CM

## 2018-11-09 DIAGNOSIS — N186 End stage renal disease: Secondary | ICD-10-CM

## 2018-11-09 DIAGNOSIS — M25511 Pain in right shoulder: Secondary | ICD-10-CM | POA: Diagnosis not present

## 2018-11-09 DIAGNOSIS — E782 Mixed hyperlipidemia: Secondary | ICD-10-CM

## 2018-11-09 DIAGNOSIS — J9601 Acute respiratory failure with hypoxia: Secondary | ICD-10-CM

## 2018-11-09 DIAGNOSIS — Z992 Dependence on renal dialysis: Secondary | ICD-10-CM | POA: Diagnosis not present

## 2018-11-09 DIAGNOSIS — S4991XA Unspecified injury of right shoulder and upper arm, initial encounter: Secondary | ICD-10-CM | POA: Diagnosis not present

## 2018-11-09 DIAGNOSIS — R29898 Other symptoms and signs involving the musculoskeletal system: Secondary | ICD-10-CM | POA: Diagnosis not present

## 2018-11-09 DIAGNOSIS — E440 Moderate protein-calorie malnutrition: Secondary | ICD-10-CM

## 2018-11-09 LAB — POCT INR: INR: 1.6 — AB (ref 0.9–1.1)

## 2018-11-09 NOTE — Patient Instructions (Signed)
Shoulder Sprain  A shoulder sprain is a partial or complete tear in one of the tough, fiber-like tissues (ligaments) in the shoulder. The ligaments in the shoulder help to hold the shoulder in place. What are the causes? This condition may be caused by:  A fall.  A hit to the shoulder.  A twist of the arm. What increases the risk? You are more likely to develop this condition if you:  Play sports.  Have problems with balance or coordination. What are the signs or symptoms? Symptoms of this condition include:  Pain when moving the shoulder.  Limited ability to move the shoulder.  Swelling and tenderness on top of the shoulder.  Warmth in the shoulder.  A change in the shape of the shoulder.  Redness or bruising on the shoulder. How is this diagnosed? This condition is diagnosed with:  A physical exam. During the exam, you may be asked to do simple exercises with your shoulder.  Imaging tests such as X-rays, MRI, or a CT scan. These tests can show how severe the sprain is. How is this treated? This condition may be treated with:  Rest.  Pain medicine.  Ice.  A sling or brace. This is used to keep the arm still while the shoulder is healing.  Physical therapy or rehabilitation exercises. These help to improve the range of motion and strength of the shoulder.  Surgery (rare). Surgery may be needed if the sprain caused a joint to become unstable. Surgery may also be needed to reduce pain. Some people may develop ongoing shoulder pain or lose some range of motion in the shoulder. However, most people do not develop long-term problems. Follow these instructions at home:  If you have a sling or brace:  Wear the sling or brace as told by your health care provider. Remove it only as told by your health care provider.  Loosen the sling or brace if your fingers tingle, become numb, or turn cold and blue.  Keep the sling or brace clean.  If the sling or brace is not  waterproof: ? Do not let it get wet. ? Cover it with a watertight covering when you take a bath or shower. Activity  Rest your shoulder.  Move your arm only as much as told by your health care provider, but move your hand and fingers often to prevent stiffness and swelling.  Return to your normal activities as told by your health care provider. Ask your health care provider what activities are safe for you.  Ask your health care provider when it is safe for you to drive if you have a sling or brace on your shoulder.  If you were shown how to do any exercises, do them as told by your health care provider. General instructions  If directed, put ice on the affected area. ? Put ice in a plastic bag. ? Place a towel between your skin and the bag. ? Leave the ice on for 20 minutes, 2-3 times a day.  Take over-the-counter and prescription medicines only as told by your health care provider.  Do not use any products that contain nicotine or tobacco, such as cigarettes, e-cigarettes, and chewing tobacco. These can delay healing. If you need help quitting, ask your health care provider.  Keep all follow-up visits as told by your health care provider. This is important. Contact a health care provider if:  Your pain gets worse.  Your pain is not relieved with medicines.  You have increased  chewing tobacco. These can delay healing. If you need help quitting, ask your health care provider.   Keep all follow-up visits as told by your health care provider. This is important.  Contact a health care provider if:   Your pain gets worse.   Your pain is not relieved with medicines.   You have increased redness or swelling.  Get help right away if:   You have a fever.   You cannot move your arm or shoulder.   You develop severe numbness or tingling in your arm, hand, or fingers.   Your arm, hand, or fingers feel cold and turn blue, white, or gray.  Summary   A shoulder sprain is a partial or complete tear in one of the tough, fiber-like tissues (ligaments) in the shoulder.   This condition may be caused by a fall, a hit to the shoulder, or a twist of the arm.   Treatment usually includes rest, ice, and pain medicine as needed.   If you have a sling or brace, wear it as told by your health care  provider. Remove it only as told by your health care provider.  This information is not intended to replace advice given to you by your health care provider. Make sure you discuss any questions you have with your health care provider.  Document Released: 09/26/2008 Document Revised: 10/14/2017 Document Reviewed: 10/14/2017  Elsevier Interactive Patient Education  2019 [employer]

## 2018-11-09 NOTE — Progress Notes (Signed)
Subjective:     Patient ID: [name] , male    DOB: [birth year] , 58 y.o.   MRN: [id number]   Chief Complaint  Patient presents with  . Hypertension    HPI  Pt is here for HTN FU Has healed well from anal fisure. Dizziness is on occasion and mainly afte dialysis.  2- pt fell 3 weeks ago on his R hand as he was trying to catch himself from one of the hypotension episodes. He did not seek medical help. Afte the fall he was able to more his arm fine and took one advil. Since then is getting better and now sometimes feels some pain when he reaches up. He has been trying to make sure he does exercises to stretch it.  3- Dm FU- glucose runs from 90-105. Gets a couple of times of hypoglycemia per week and gets light headed and mild nausea on occasion, but sweating. They get to 60, but never below and is because he has not eaten.  Past Medical History:  Diagnosis Date  . Acute respiratory failure with hypoxia ([place]) 01/13/2015  . Atrial flutter ([place])   . Bile leak, postoperative 03/15/2016  . Biliary dyskinesia 02/12/2016  . 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 Cardiomyopathy   . CHF (congestive heart failure) ([place])   . Coronary artery disease    normal coronaries by 10/10/08 cath, Cardiac Cath 08-04-12 epic.Dr. Doylene Canard follows  . Diabetes mellitus    pt. states he's borderline diabetic., no longer taking med,- off med. since 2013  . Dialysis patient [facility])    Mon-Wed-Fri([place])- Left AV fistula  . Diverticulitis November 2016   reoccurred in December 2016  . Diverticulitis of intestine without perforation or abscess without bleeding 04/21/2015  . ESRD (end stage renal disease) ([place])   . GERD (gastroesophageal reflux disease)   . Gout   . Hemorrhage of left kidney 01/13/2018  . History of nephrectomy 07/04/2012   History of right nephrectomy in 2002 for renal cell carcinoma   . History of unilateral nephrectomy   . Hypertension   . Low iron   .  Myocardial infarction [facility]) ?2006  . Renal cell carcinoma    dialysis- MWF- Dr. Mercy Moore follows.  . Renal insufficiency   . Shortness of breath 05/19/11   "at rest, lying down, w/exertion"  . Stroke [facility]) 02/2011   05/19/11 denies residual  . Umbilical hernia 30/07/62   unrepaired     Family History  Problem Relation Age of Onset  . Hypertension Mother   . Kidney disease Mother      Current Outpatient Medications:  .  acetaminophen (TYLENOL) 325 MG tablet, Take 2 tablets (650 mg total) by mouth every 6 (six) hours as needed for mild pain (or Fever >/= 101)., Disp: , Rfl:  .  allopurinol (ZYLOPRIM) 300 MG tablet, Take 150 mg by mouth every other day., Disp: , Rfl:  .  Amino Acids-Protein Hydrolys (FEEDING SUPPLEMENT, PRO-STAT SUGAR FREE 64,) LIQD, Take 30 mLs by mouth 2 (two) times daily., Disp: 900 mL, Rfl: 0 .  amiodarone (PACERONE) 200 MG tablet, Take 0.5 tablets (100 mg total) by mouth daily., Disp: 30 tablet, Rfl: 1 .  calcium acetate (PHOSLO) 667 MG capsule, Take 667-2,001 mg by mouth See admin instructions. Take 2001 mg  capsules with meals and 800 mg capsule with snacks., Disp: , Rfl:  .  chlorhexidine (PERIDEX) 0.12 % solution, 15 mLs by  Mouth Rinse route daily as needed. Mouth rinse, Disp: , Rfl:  .  doxercalciferol (HECTOROL) 4 MCG/2ML injection, Inject 1.5 mLs (3 mcg total) into the vein every Monday, Wednesday, and Friday with hemodialysis., Disp: , Rfl:  .  ferric citrate (AURYXIA) 1 GM 210 MG(Fe) tablet, Take 210 mg by mouth 3 (three) times daily with meals. , Disp: , Rfl:  .  isosorbide dinitrate (ISORDIL) 20 MG tablet, Take 20 mg by mouth 2 (two) times daily., Disp: , Rfl:  .  Lidocaine, Anorectal, (L-M-X 5) 5 % CREA, Apply 1 application topically 4 (four) times daily as needed (apply to area up to 6 times daily as needed)., Disp: 1 Tube, Rfl: 0 .  lidocaine-prilocaine (EMLA) cream, Apply 1 application topically See admin instructions. Apply topically to port access  prior to dialysis - Monday, Wednesday, Friday, Disp: , Rfl: 12 .  meclizine (ANTIVERT) 25 MG tablet, Take 1 tablet (25 mg total) by mouth 3 (three) times daily as needed for dizziness., Disp: 30 tablet, Rfl: 0 .  midodrine (PROAMATINE) 10 MG tablet, 30 mg  on the days of dyalisis three times a week, and only takes 10 mg on off dyalisis days. (Patient taking differently: Take 10-15 mg by mouth See admin instructions. 15 mg  on the days of dyalisis three times a week, and only takes 10 mg on off dyalisis days.), Disp: 1 tablet, Rfl: 0 .  mirtazapine (REMERON) 7.5 MG tablet, Take 1 tablet (7.5 mg total) by mouth at bedtime., Disp: 30 tablet, Rfl: 2 .  multivitamin (RENA-VIT) TABS tablet, Take 1 tablet by mouth at bedtime., Disp: , Rfl:  .  nitroGLYCERIN (NITROSTAT) 0.4 MG SL tablet, Place 0.4 mg under the tongue every 5 (five) minutes as needed for chest pain. , Disp: , Rfl: 0 .  oxyCODONE (OXY IR/ROXICODONE) 5 MG immediate release tablet, Take 1-2 tablets (5-10 mg total) by mouth every 6 (six) hours as needed for severe pain or breakthrough pain. (Patient not taking: Reported on 11/09/2018), Disp: 30 tablet, Rfl: 0 .  pantoprazole (PROTONIX) 40 MG tablet, Take 40 mg by mouth 2 (two) times daily., Disp: , Rfl:  .  UNABLE TO FIND, Out patient physical therapy- vestibular therapy for BPPV, Disp: 1 each, Rfl: 0 .  warfarin (COUMADIN) 10 MG tablet, Take 1 tablet (10 mg total) by mouth daily at 6 PM., Disp: , Rfl:    Allergies  Allergen Reactions  . Ace Inhibitors Itching and Cough       Review of Systems  Constitutional: Negative for diaphoresis and unexpected weight change.  HENT: Negative for tinnitus.   Eyes: Negative for visual disturbance.  Respiratory: Negative for chest tightness and shortness of breath.  Cardiovascular: Negative for chest pain, palpitations and leg swelling.  Gastrointestinal: Negative for constipation, nausea. Has had a flair of his diverticulitis this week, but is better  today. Denies blood in stool.   Endocrine: Negative for polydipsia, polyphagia and polyuria.  Genitourinary: Negative for dysuria and frequency.  Skin: Negative for rash and wound.  Neurological: denies speech difficulty, weakness, numbness and headaches. Has intermittent dizziness from hypotension. Had dialysis yesterday. Does not feel dizzy today.  Today's Vitals   11/09/18 0906  BP: 90/70  Pulse: (!) 102  Temp: 98.2 F (36.8 C)  TempSrc: Oral  SpO2: 99%  Weight: 244 lb 6.4 oz (110.9 kg)  Height: 5' 9.2" (1.758 m)   Body mass index is 35.88 kg/m.   Objective:  Physical Exam   Constitutional:  She is oriented to person, place, and time. She appears well-developed and well-nourished. No distress.  HENT:  Head: Normocephalic and atraumatic.  Right Ear: External ear normal.  Left Ear: External ear normal.  Nose: Nose normal.  Eyes: Conjunctivae are normal. Right eye exhibits no discharge. Left eye exhibits no discharge. No scleral icterus.  Neck: Neck supple. No thyromegaly present.  No carotid bruits bilaterally  Cardiovascular: Normal rate and regular rhythm.  No murmur heard. Pulmonary/Chest: Effort normal and breath sounds normal. No respiratory distress.  Musculoskeletal: Normal range of motion, but has pain with extension of his R shoulder to >90 degrees laterally and forward. Seems pretty stiff when he tries to lift R shoulder laterally.  Has tenderness of R bicep tendon region.  He exhibits no edema. Foot exam- show no wounds, or decreased sensation. Has some callus on heels.  Lymphadenopathy:    She has no cervical adenopathy.  Neurological: She is alert and oriented to person, place, and time.  Skin: Skin is warm and dry. Capillary refill takes less than 2 seconds. No rash noted. She is not diaphoretic.  Psychiatric: he has a normal mood and affect. His behavior is normal. Judgment and thought content normal.  Nursing note reviewed.    Assessment And Plan:   1. Acute  pain of right shoulder- acute - DG Shoulder Right; Future - Ambulatory referral to Physical Therapy  2. Mixed hyperlipidemia- chronic - Lipid Profile  3. Controlled type 2 diabetes mellitus with diabetic nephropathy, without long-term current use of insulin ([place])- chronic. May continue same med - Hemoglobin A1c  4. Hypertension, benign- stable - CBC no Diff  5. Hypertensive heart and CKD, ESRD on dialysis, w CHF ([place])- chronic. Will continue dialysis 3x/week and nephrology apts as usual.     Matthew Journey RODRIGUEZ-SOUTHWORTH, PA-C    THE PATIENT IS ENCOURAGED TO PRACTICE SOCIAL DISTANCING DUE TO THE COVID-19 PANDEMIC.

## 2018-11-09 NOTE — Progress Notes (Signed)
Subjective:   [name] is a 58 y.o. male who presents for an Initial Medicare Annual Wellness Visit.  Review of Systems  n/a Cardiac Risk Factors include: diabetes mellitus;male gender;obesity (BMI >30kg/m2)    Objective:    Today's Vitals   11/09/18 0839 11/09/18 0846  BP: 90/70   Pulse: (!) 102   Temp: 98.2 F (36.8 C)   TempSrc: Oral   SpO2: 99%   Weight: 244 lb 6.4 oz (110.9 kg)   Height: 5' 9.2" (1.758 m)   PainSc:  5    Body mass index is 35.88 kg/m.  Advanced Directives 11/09/2018 08/16/2018 08/10/2018 07/31/2018 07/09/2018 06/20/2018 06/20/2018  Does Patient Have a Medical Advance Directive? No No No No No No No  Would patient like information on creating a medical advance directive? No - Patient declined No - Patient declined No - Patient declined No - Patient declined - No - Patient declined -  Pre-existing out of facility DNR order (yellow form or pink MOST form) - - - - - - -    Current Medications (verified) Outpatient Encounter Medications as of 11/09/2018  Medication Sig  . acetaminophen (TYLENOL) 325 MG tablet Take 2 tablets (650 mg total) by mouth every 6 (six) hours as needed for mild pain (or Fever >/= 101).  Marland Kitchen allopurinol (ZYLOPRIM) 300 MG tablet Take 150 mg by mouth every other day.  . Amino Acids-Protein Hydrolys (FEEDING SUPPLEMENT, PRO-STAT SUGAR FREE 64,) LIQD Take 30 mLs by mouth 2 (two) times daily.  Marland Kitchen amiodarone (PACERONE) 200 MG tablet Take 0.5 tablets (100 mg total) by mouth daily.  . calcium acetate (PHOSLO) 667 MG capsule Take 667-2,001 mg by mouth See admin instructions. Take 2001 mg  capsules with meals and 800 mg capsule with snacks.  . chlorhexidine (PERIDEX) 0.12 % solution 15 mLs by Mouth Rinse route daily as needed. Mouth rinse  . doxercalciferol (HECTOROL) 4 MCG/2ML injection Inject 1.5 mLs (3 mcg total) into the vein every Monday, Wednesday, and Friday with hemodialysis.  . ferric citrate (AURYXIA) 1 GM 210 MG(Fe) tablet Take 210 mg by  mouth 3 (three) times daily with meals.   . isosorbide dinitrate (ISORDIL) 20 MG tablet Take 20 mg by mouth 2 (two) times daily.  . Lidocaine, Anorectal, (L-M-X 5) 5 % CREA Apply 1 application topically 4 (four) times daily as needed (apply to area up to 6 times daily as needed).  Marland Kitchen lidocaine-prilocaine (EMLA) cream Apply 1 application topically See admin instructions. Apply topically to port access prior to dialysis - Monday, Wednesday, Friday  . meclizine (ANTIVERT) 25 MG tablet Take 1 tablet (25 mg total) by mouth 3 (three) times daily as needed for dizziness.  . midodrine (PROAMATINE) 10 MG tablet 30 mg  on the days of dyalisis three times a week, and only takes 10 mg on off dyalisis days. (Patient taking differently: Take 10-15 mg by mouth See admin instructions. 15 mg  on the days of dyalisis three times a week, and only takes 10 mg on off dyalisis days.)  . mirtazapine (REMERON) 7.5 MG tablet Take 1 tablet (7.5 mg total) by mouth at bedtime.  . multivitamin (RENA-VIT) TABS tablet Take 1 tablet by mouth at bedtime.  . nitroGLYCERIN (NITROSTAT) 0.4 MG SL tablet Place 0.4 mg under the tongue every 5 (five) minutes as needed for chest pain.   . pantoprazole (PROTONIX) 40 MG tablet Take 40 mg by mouth 2 (two) times daily.  Marland Kitchen warfarin (COUMADIN) 10 MG tablet Take  1 tablet (10 mg total) by mouth daily at 6 PM.  . UNABLE TO FIND Out patient physical therapy- vestibular therapy for BPPV  . [DISCONTINUED] oxyCODONE (OXY IR/ROXICODONE) 5 MG immediate release tablet Take 1-2 tablets (5-10 mg total) by mouth every 6 (six) hours as needed for severe pain or breakthrough pain. (Patient not taking: Reported on 11/09/2018)   No facility-administered encounter medications on file as of 11/09/2018.     Allergies (verified) Ace inhibitors   History: Past Medical History:  Diagnosis Date  . Acute respiratory failure with hypoxia ([place]) 01/13/2015  . Atrial flutter ([place])   . Bile leak, postoperative 03/15/2016   . Biliary dyskinesia 02/12/2016  . 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 Cardiomyopathy   . CHF (congestive heart failure) ([place])   . Coronary artery disease    normal coronaries by 10/10/08 cath, Cardiac Cath 08-04-12 epic.Dr. Doylene Canard follows  . Diabetes mellitus    pt. states he's borderline diabetic., no longer taking med,- off med. since 2013  . Dialysis patient [facility])    Mon-Wed-Fri([place])- Left AV fistula  . Diverticulitis November 2016   reoccurred in December 2016  . Diverticulitis of intestine without perforation or abscess without bleeding 04/21/2015  . ESRD (end stage renal disease) ([place])   . GERD (gastroesophageal reflux disease)   . Gout   . Hemorrhage of left kidney 01/13/2018  . History of nephrectomy 07/04/2012   History of right nephrectomy in 2002 for renal cell carcinoma   . History of unilateral nephrectomy   . Hypertension   . Low iron   . Myocardial infarction [facility]) ?2006  . Renal cell carcinoma    dialysis- MWF- Dr. Mercy Moore follows.  . Renal insufficiency   . Shortness of breath 05/19/11   "at rest, lying down, w/exertion"  . Stroke [facility]) 02/2011   05/19/11 denies residual  . Umbilical hernia 75/10/25   unrepaired   Past Surgical History:  Procedure Laterality Date  . ANAL FISTULOTOMY  08/15/2018  . AV FISTULA PLACEMENT  03/29/2011   Procedure: ARTERIOVENOUS (AV) FISTULA CREATION;  Surgeon: Hinda Lenis, MD;  Location: [place];  Service: Vascular;  Laterality: Left;  LEFT RADIOCEPHALIC , Arteriovenous ENIDPOE(42353)  . CARDIAC CATHETERIZATION  05/21/11  . CARDIAC CATHETERIZATION N/A 03/16/2016   Procedure: Left Heart Cath and Coronary Angiography;  Surgeon: Dixie Dials, MD;  Location: [place] CV LAB;  Service: Cardiovascular;  Laterality: N/A;  . CHOLECYSTECTOMY N/A 02/12/2016   Procedure: LAPAROSCOPIC CHOLECYSTECTOMY WITH INTRAOPERATIVE CHOLANGIOGRAM;  Surgeon: Jackolyn Confer, MD;  Location: [place];   Service: General;  Laterality: N/A;  . COLONOSCOPY WITH PROPOFOL N/A 02/01/2017   Procedure: COLONOSCOPY WITH PROPOFOL;  Surgeon: Juanita Craver, MD;  Location: [facility];  Service: Endoscopy;  Laterality: N/A;  . dialysis cath placed    . ESOPHAGOGASTRODUODENOSCOPY (EGD) WITH PROPOFOL N/A 12/02/2015   Procedure: ESOPHAGOGASTRODUODENOSCOPY (EGD) WITH PROPOFOL;  Surgeon: Juanita Craver, MD;  Location: [facility];  Service: Endoscopy;  Laterality: N/A;  . EVALUATION UNDER ANESTHESIA WITH HEMORRHOIDECTOMY N/A 08/15/2018   Procedure: HEMORRHOID LIGATION/PEXY;  Surgeon: Michael Boston, MD;  Location: [place];  Service: General;  Laterality: N/A;  . FINGER SURGERY     L pinkie finger- ORIF- /w remaining hardware - 1990's    . FISTULOTOMY N/A 08/15/2018   Procedure: SUPERFICIAL ANAL FISTULOTOMY;  Surgeon: Michael Boston, MD;  Location: [place];  Service: General;  Laterality: N/A;  . HEMORRHOID SURGERY    .  HERNIA REPAIR N/A 2/72/5366   Umbilical hernia repair  . INSERTION OF DIALYSIS CATHETER N/A 01/27/2016   Procedure: INSERTION OF DIALYSIS CATHETER;  Surgeon: Waynetta Sandy, MD;  Location: [place];  Service: Vascular;  Laterality: N/A;  . INSERTION OF MESH N/A 07/04/2012   Procedure: INSERTION OF MESH;  Surgeon: Madilyn Hook, DO;  Location: [place];  Service: General;  Laterality: N/A;  . IR EMBO ART  VEN HEMORR LYMPH EXTRAV  INC GUIDE ROADMAPPING  01/13/2018  . IR FLUORO GUIDE CV LINE RIGHT  01/13/2018  . IR IVC FILTER PLMT / S&I /IMG GUID/MOD SED  01/27/2018  . IR RENAL SELECTIVE  UNI INC S&I MOD SED  01/13/2018  . IR US GUIDE VASC ACCESS RIGHT  01/13/2018  . KIDNEY CYST REMOVAL  2019  . LAPAROSCOPIC LYSIS OF ADHESIONS N/A 02/12/2016   Procedure: LAPAROSCOPIC LYSIS OF ADHESIONS;  Surgeon: Jackolyn Confer, MD;  Location: [place];  Service: General;  Laterality: N/A;  . LEFT AND RIGHT HEART CATHETERIZATION WITH CORONARY ANGIOGRAM N/A 08/04/2012   Procedure: LEFT AND RIGHT HEART CATHETERIZATION WITH CORONARY  ANGIOGRAM;  Surgeon: Birdie Riddle, MD;  Location: [place] CATH LAB;  Service: Cardiovascular;  Laterality: N/A;  . NEPHRECTOMY  2000   right  . REVISON OF ARTERIOVENOUS FISTULA Left 06/05/2013   Procedure: REVISON OF LEFT RADIOCEPHALIC  ARTERIOVENOUS FISTULA;  Surgeon: Conrad Coal Hill, MD;  Location: [place];  Service: Vascular;  Laterality: Left;  . REVISON OF ARTERIOVENOUS FISTULA Left 01/27/2016   Procedure: REVISION OF LEFT UPPER EXTREMITY ARTERIOVENOUS FISTULA;  Surgeon: Waynetta Sandy, MD;  Location: [place];  Service: Vascular;  Laterality: Left;  . REVISON OF ARTERIOVENOUS FISTULA Left 08/03/2016   Procedure: REVISON OF Left arm ARTERIOVENOUS FISTULA;  Surgeon: Elam Dutch, MD;  Location: [place];  Service: Vascular;  Laterality: Left;  . REVISON OF ARTERIOVENOUS FISTULA Left 05/06/2017   Procedure: REVISION PLICATION OF ARTERIOVENOUS FISTULA LEFT ARM;  Surgeon: Rosetta Posner, MD;  Location: [place];  Service: Vascular;  Laterality: Left;  . RIGHT HEART CATHETERIZATION N/A 05/21/2011   Procedure: RIGHT HEART CATH;  Surgeon: Birdie Riddle, MD;  Location[facility] C[facility] LAB;  Service: Cardiovascular;  Laterality: N/A;  . smashed  1990's   "left pinky; have a plate in there"  . UMBILICAL HERNIA REPAIR N/A 07/04/2012   Procedure: LAPAROSCOPIC UMBILICAL HERNIA;  Surgeon: Madilyn Hook, DO;  Location: [place];  Service: General;  Laterality: N/A;  . US ECHOCARDIOGRAPHY  05/20/11   Family History  Problem Relation Age of Onset  . Hypertension Mother   . Kidney disease Mother    Social History   Socioeconomic History  . Marital status: Married    Spouse name: Not on file  . Number of children: Not on file  . Years of education: Not on file  . Highest education level: Not on file  Occupational History  . Occupation: disability  Social Needs  . Financial resource strain: Not hard at all  . Food insecurity    Worry: Never true    Inability: Never true  . Transportation needs    Medical: No     Non-medical: No  Tobacco Use  . Smoking status: Never Smoker  . Smokeless tobacco: Never Used  Substance and Sexual Activity  . Alcohol use: No    Alcohol/week: 0.0 standard drinks  . Drug use: No    Types: Cocaine    Comment: Past hx-none in 20 yrs  . Sexual activity:  Yes  Lifestyle  . Physical activity    Days per week: 3 days    Minutes per session: 150+ min  . Stress: Not at all  Relationships  . Social Herbalist on phone: Not on file    Gets together: Not on file    Attends religious service: Not on file    Active member of club or organization: Not on file    Attends meetings of clubs or organizations: Not on file    Relationship status: Not on file  Other Topics Concern  . Not on file  Social History Narrative  . Not on file   Tobacco Counseling Counseling given: Not Answered   Clinical Intake:  Pre-visit preparation completed: Yes  Pain : 0-10 Pain Score: 5  Pain Type: Acute pain Pain Location: Arm Pain Orientation: Right Pain Radiating Towards: shoulder with movement Pain Descriptors / Indicators: Sharp Pain Onset: 1 to 4 weeks ago Pain Frequency: Intermittent Pain Relieving Factors: advil helps Effect of Pain on Daily Activities: none  Pain Relieving Factors: advil helps  Nutritional Status: BMI > 30  Obese Nutritional Risks: None Diabetes: Yes CBG done?: No Did pt. bring in CBG monitor from home?: No  How often do you need to have someone help you when you read instructions, pamphlets, or other written materials from your doctor or pharmacy?: 1 - Never What is the last grade level you completed in school?: college  Interpreter Needed?: No  Information entered by :: NAllen LPN  Activities of Daily Living In your present state of health, do you have any difficulty performing the following activities: 11/09/2018 08/16/2018  Hearing? N -  Vision? N -  Difficulty concentrating or making decisions? N -  Walking or climbing stairs? Y  -  Comment due to chf -  Dressing or bathing? N -  Doing errands, shopping? N N  Preparing Food and eating ? N -  Using the Toilet? N -  In the past six months, have you accidently leaked urine? N -  Do you have problems with loss of bowel control? N -  Managing your Medications? N -  Managing your Finances? N -  Housekeeping or managing your Housekeeping? N -  Some recent data might be hidden     Immunizations and Health Maintenance  There is no immunization history on file for this patient. Health Maintenance Due  Topic Date Due  . OPHTHALMOLOGY EXAM  03/27/1971    Patient Care Team: Rodriguez-Southworth, Sunday Spillers, PA-C as PCP - General (Internal Medicine) Michael Boston, MD as Consulting Physician (General Surgery) Juanita Craver, MD as Consulting Physician (Gastroenterology) Dixie Dials, MD as Consulting Physician (Cardiology) Donato Heinz, MD as Consulting Physician (Nephrology)  Indicate any recent Medical Services you may have received from other than [facility] providers in the past year (date may be approximate).    Assessment:   This is a routine wellness examination for Shahid.  Hearing/Vision screen  Hearing Screening   125Hz  250Hz  500Hz  1000Hz  2000Hz  3000Hz  4000Hz  6000Hz  8000Hz   Right ear:           Left ear:           Vision Screening Comments: Annual eye exams  Dietary issues and exercise activities discussed: Current Exercise Habits: Home exercise routine, Type of exercise: walking, Time (Minutes): > 60, Frequency (Times/Week): 3, Weekly Exercise (Minutes/Week): 0  Goals    . Weight (lb) < 200 lb (90.7 kg)     Wants to lose 20  pounds      Depression Screen PHQ 2/9 Scores 11/09/2018 07/18/2018 07/11/2018 07/05/2018  PHQ - 2 Score 0 0 0 0  PHQ- 9 Score 1 - - -    Fall Risk Fall Risk  11/09/2018 07/18/2018 07/05/2018 06/20/2018  Falls in the past year? 1 0 0 1  Number falls in past yr: 0 - - 0  Comment bp drop and fell - - -  Injury with Fall? 0 - - 0   Risk for fall due to : History of fall(s);Medication side effect - - -  Follow up Education provided;Falls prevention discussed - - -    Is the patient's home free of loose throw rugs in walkways, pet beds, electrical cords, etc?   yes      Grab bars in the bathroom? no      Handrails on the stairs?   no      Adequate lighting?   yes  Timed Get Up and Go performed: n/a  Cognitive Function:     6CIT Screen 11/09/2018  What Year? 0 points  What month? 0 points  What time? 0 points  Count back from 20 0 points  Months in reverse 4 points  Repeat phrase 0 points  Total Score 4    Screening Tests Health Maintenance  Topic Date Due  . OPHTHALMOLOGY EXAM  03/27/1971  . URINE MICROALBUMIN  12/09/2018 (Originally 03/27/1971)  . PNEUMOCOCCAL POLYSACCHARIDE VACCINE AGE 48-64 HIGH RISK  11/09/2019 (Originally 03/27/1963)  . INFLUENZA VACCINE  12/23/2018  . HEMOGLOBIN A1C  01/31/2019  . FOOT EXAM  07/19/2019  . TETANUS/TDAP  08/11/2026  . COLONOSCOPY  02/02/2027  . Hepatitis C Screening  Completed  . HIV Screening  Completed    Qualifies for Shingles Vaccine? yes  Cancer Screenings: Lung: Low Dose CT Chest recommended if Age 61-80 years, 30 pack-year currently smoking OR have quit w/in 15years. Patient does not qualify. Colorectal: up to date  Additional Screenings:  Hepatitis C Screening: 07/18/2018      Plan:   Wants to lose 20 pounds. Declines vaccinations  I have personally reviewed and noted the following in the patient's chart:   . Medical and social history . Use of alcohol, tobacco or illicit drugs  . Current medications and supplements . Functional ability and status . Nutritional status . Physical activity . Advanced directives . List of other physicians . Hospitalizations, surgeries, and ER visits in previous 12 months . Vitals . Screenings to include cognitive, depression, and falls . Referrals and appointments  In addition, I have reviewed and discussed  with patient certain preventive protocols, quality metrics, and best practice recommendations. A written personalized care plan for preventive services as well as general preventive health recommendations were provided to patient.     Kellie Simmering, LPN   7/41/2878

## 2018-11-09 NOTE — Patient Instructions (Signed)
[name] , Thank you for taking time to come for your Medicare Wellness Visit. I appreciate your ongoing commitment to your health goals. Please review the following plan we discussed and let me know if I can assist you in the future.   Screening recommendations/referrals: Colonoscopy: 01/2017 Recommended yearly ophthalmology/optometry visit for glaucoma screening and checkup Recommended yearly dental visit for hygiene and checkup  Vaccinations: Influenza vaccine: declines Pneumococcal vaccine: declines Tdap vaccine: 07/2016 Shingles vaccine: discussed    Advanced directives: Advance directive discussed with you today. Even though you declined this today please call our office should you change your mind and we can give you the proper paperwork for you to fill out.   Conditions/risks identified: obesity  Next appointment: 02/15/2019 at [place] 40-64 Years, Male Preventive care refers to lifestyle choices and visits with your health care provider that can promote health and wellness. What does preventive care include?  A yearly physical exam. This is also called an annual well check.  Dental exams once or twice a year.  Routine eye exams. Ask your health care provider how often you should have your eyes checked.  Personal lifestyle choices, including:  Daily care of your teeth and gums.  Regular physical activity.  Eating a healthy diet.  Avoiding tobacco and drug use.  Limiting alcohol use.  Practicing safe sex.  Taking low-dose aspirin every day starting at age 43. What happens during an annual well check? The services and screenings done by your health care provider during your annual well check will depend on your age, overall health, lifestyle risk factors, and family history of disease. Counseling  Your health care provider may ask you questions about your:  Alcohol use.  Tobacco use.  Drug use.  Emotional well-being.  Home and relationship  well-being.  Sexual activity.  Eating habits.  Work and work Statistician. Screening  You may have the following tests or measurements:  Height, weight, and BMI.  Blood pressure.  Lipid and cholesterol levels. These may be checked every 5 years, or more frequently if you are over 49 years old.  Skin check.  Lung cancer screening. You may have this screening every year starting at age 62 if you have a 30-pack-year history of smoking and currently smoke or have quit within the past 15 years.  Fecal occult blood test (FOBT) of the stool. You may have this test every year starting at age 54.  Flexible sigmoidoscopy or colonoscopy. You may have a sigmoidoscopy every 5 years or a colonoscopy every 10 years starting at age 2.  Prostate cancer screening. Recommendations will vary depending on your family history and other risks.  Hepatitis C blood test.  Hepatitis B blood test.  Sexually transmitted disease (STD) testing.  Diabetes screening. This is done by checking your blood sugar (glucose) after you have not eaten for a while (fasting). You may have this done every 1-3 years. Discuss your test results, treatment options, and if necessary, the need for more tests with your health care provider. Vaccines  Your health care provider may recommend certain vaccines, such as:  Influenza vaccine. This is recommended every year.  Tetanus, diphtheria, and acellular pertussis (Tdap, Td) vaccine. You may need a Td booster every 10 years.  Zoster vaccine. You may need this after age 38.  Pneumococcal 13-valent conjugate (PCV13) vaccine. You may need this if you have certain conditions and have not been vaccinated.  Pneumococcal polysaccharide (PPSV23) vaccine. You may need one or two  doses if you smoke cigarettes or if you have certain conditions. Talk to your health care provider about which screenings and vaccines you need and how often you need them. This information is not intended  to replace advice given to you by your health care provider. Make sure you discuss any questions you have with your health care provider. Document Released: 06/06/2015 Document Revised: 01/28/2016 Document Reviewed: 03/11/2015 Elsevier Interactive Patient Education  2017 [place] Prevention in the Home Falls can cause injuries. They can happen to people of all ages. There are many things you can do to make your home safe and to help prevent falls. What can I do on the outside of my home?  Regularly fix the edges of walkways and driveways and fix any cracks.  Remove anything that might make you trip as you walk through a door, such as a raised step or threshold.  Trim any bushes or trees on the path to your home.  Use bright outdoor lighting.  Clear any walking paths of anything that might make someone trip, such as rocks or tools.  Regularly check to see if handrails are loose or broken. Make sure that both sides of any steps have handrails.  Any raised decks and porches should have guardrails on the edges.  Have any leaves, snow, or ice cleared regularly.  Use sand or salt on walking paths during winter.  Clean up any spills in your garage right away. This includes oil or grease spills. What can I do in the bathroom?  Use night lights.  Install grab bars by the toilet and in the tub and shower. Do not use towel bars as grab bars.  Use non-skid mats or decals in the tub or shower.  If you need to sit down in the shower, use a plastic, non-slip stool.  Keep the floor dry. Clean up any water that spills on the floor as soon as it happens.  Remove soap buildup in the tub or shower regularly.  Attach bath mats securely with double-sided non-slip rug tape.  Do not have throw rugs and other things on the floor that can make you trip. What can I do in the bedroom?  Use night lights.  Make sure that you have a light by your bed that is easy to reach.  Do not use  any sheets or blankets that are too big for your bed. They should not hang down onto the floor.  Have a firm chair that has side arms. You can use this for support while you get dressed.  Do not have throw rugs and other things on the floor that can make you trip. What can I do in the kitchen?  Clean up any spills right away.  Avoid walking on wet floors.  Keep items that you use a lot in easy-to-reach places.  If you need to reach something above you, use a strong step stool that has a grab bar.  Keep electrical cords out of the way.  Do not use floor polish or wax that makes floors slippery. If you must use wax, use non-skid floor wax.  Do not have throw rugs and other things on the floor that can make you trip. What can I do with my stairs?  Do not leave any items on the stairs.  Make sure that there are handrails on both sides of the stairs and use them. Fix handrails that are broken or loose. Make sure that handrails are as  long as the stairways.  Check any carpeting to make sure that it is firmly attached to the stairs. Fix any carpet that is loose or worn.  Avoid having throw rugs at the top or bottom of the stairs. If you do have throw rugs, attach them to the floor with carpet tape.  Make sure that you have a light switch at the top of the stairs and the bottom of the stairs. If you do not have them, ask someone to add them for you. What else can I do to help prevent falls?  Wear shoes that:  Do not have high heels.  Have rubber bottoms.  Are comfortable and fit you well.  Are closed at the toe. Do not wear sandals.  If you use a stepladder:  Make sure that it is fully opened. Do not climb a closed stepladder.  Make sure that both sides of the stepladder are locked into place.  Ask someone to hold it for you, if possible.  Clearly mark and make sure that you can see:  Any grab bars or handrails.  First and last steps.  Where the edge of each step is.   Use tools that help you move around (mobility aids) if they are needed. These include:  Canes.  Walkers.  Scooters.  Crutches.  Turn on the lights when you go into a dark area. Replace any light bulbs as soon as they burn out.  Set up your furniture so you have a clear path. Avoid moving your furniture around.  If any of your floors are uneven, fix them.  If there are any pets around you, be aware of where they are.  Review your medicines with your doctor. Some medicines can make you feel dizzy. This can increase your chance of falling. Ask your doctor what other things that you can do to help prevent falls. This information is not intended to replace advice given to you by your health care provider. Make sure you discuss any questions you have with your health care provider. Document Released: 03/06/2009 Document Revised: 10/16/2015 Document Reviewed: 06/14/2014 Elsevier Interactive Patient Education  2017 [employer].

## 2018-11-10 DIAGNOSIS — N2581 Secondary hyperparathyroidism of renal origin: Secondary | ICD-10-CM | POA: Diagnosis not present

## 2018-11-10 DIAGNOSIS — D631 Anemia in chronic kidney disease: Secondary | ICD-10-CM | POA: Diagnosis not present

## 2018-11-10 DIAGNOSIS — N186 End stage renal disease: Secondary | ICD-10-CM | POA: Diagnosis not present

## 2018-11-10 DIAGNOSIS — Z992 Dependence on renal dialysis: Secondary | ICD-10-CM | POA: Diagnosis not present

## 2018-11-10 DIAGNOSIS — E1129 Type 2 diabetes mellitus with other diabetic kidney complication: Secondary | ICD-10-CM | POA: Diagnosis not present

## 2018-11-10 LAB — HEMOGLOBIN A1C
Est. average glucose Bld gHb Est-mCnc: 105 mg/dL
Hgb A1c MFr Bld: 5.3 % (ref 4.8–5.6)

## 2018-11-10 LAB — CBC
Hematocrit: 35 % — ABNORMAL LOW (ref 37.5–51.0)
Hemoglobin: 12 g/dL — ABNORMAL LOW (ref 13.0–17.7)
MCH: 31.3 pg (ref 26.6–33.0)
MCHC: 34.3 g/dL (ref 31.5–35.7)
MCV: 91 fL (ref 79–97)
Platelets: 237 10*3/uL (ref 150–450)
RBC: 3.83 x10E6/uL — ABNORMAL LOW (ref 4.14–5.80)
RDW: 15.4 % (ref 11.6–15.4)
WBC: 5.4 10*3/uL (ref 3.4–10.8)

## 2018-11-10 LAB — LIPID PANEL
Chol/HDL Ratio: 5 ratio (ref 0.0–5.0)
Cholesterol, Total: 214 mg/dL — ABNORMAL HIGH (ref 100–199)
HDL: 43 mg/dL (ref >39)
LDL Calculated: 123 mg/dL — ABNORMAL HIGH (ref 0–99)
Triglycerides: 238 mg/dL — ABNORMAL HIGH (ref 0–149)
VLDL Cholesterol Cal: 48 mg/dL — ABNORMAL HIGH (ref 5–40)

## 2018-11-13 DIAGNOSIS — E1129 Type 2 diabetes mellitus with other diabetic kidney complication: Secondary | ICD-10-CM | POA: Diagnosis not present

## 2018-11-13 DIAGNOSIS — Z992 Dependence on renal dialysis: Secondary | ICD-10-CM | POA: Diagnosis not present

## 2018-11-13 DIAGNOSIS — N186 End stage renal disease: Secondary | ICD-10-CM | POA: Diagnosis not present

## 2018-11-13 DIAGNOSIS — D631 Anemia in chronic kidney disease: Secondary | ICD-10-CM | POA: Diagnosis not present

## 2018-11-13 DIAGNOSIS — N2581 Secondary hyperparathyroidism of renal origin: Secondary | ICD-10-CM | POA: Diagnosis not present

## 2018-11-15 DIAGNOSIS — D631 Anemia in chronic kidney disease: Secondary | ICD-10-CM | POA: Diagnosis not present

## 2018-11-15 DIAGNOSIS — E1129 Type 2 diabetes mellitus with other diabetic kidney complication: Secondary | ICD-10-CM | POA: Diagnosis not present

## 2018-11-15 DIAGNOSIS — N2581 Secondary hyperparathyroidism of renal origin: Secondary | ICD-10-CM | POA: Diagnosis not present

## 2018-11-15 DIAGNOSIS — Z992 Dependence on renal dialysis: Secondary | ICD-10-CM | POA: Diagnosis not present

## 2018-11-15 DIAGNOSIS — N186 End stage renal disease: Secondary | ICD-10-CM | POA: Diagnosis not present

## 2018-11-15 DIAGNOSIS — I4892 Unspecified atrial flutter: Secondary | ICD-10-CM | POA: Diagnosis not present

## 2018-11-16 ENCOUNTER — Encounter (HOSPITAL_COMMUNITY): Payer: Self-pay | Admitting: Emergency Medicine

## 2018-11-16 ENCOUNTER — Other Ambulatory Visit: Payer: Self-pay

## 2018-11-16 ENCOUNTER — Emergency Department (HOSPITAL_COMMUNITY): Payer: No Typology Code available for payment source

## 2018-11-16 ENCOUNTER — Emergency Department (HOSPITAL_COMMUNITY)
Admission: EM | Admit: 2018-11-16 | Discharge: 2018-11-16 | Disposition: A | Discharge status: Home / Self Care | Payer: No Typology Code available for payment source | Attending: Pediatric Critical Care Medicine | Admitting: Pediatric Critical Care Medicine

## 2018-11-16 DIAGNOSIS — I959 Hypotension, unspecified: Secondary | ICD-10-CM | POA: Diagnosis not present

## 2018-11-16 DIAGNOSIS — R10817 Generalized abdominal tenderness: Secondary | ICD-10-CM | POA: Insufficient documentation

## 2018-11-16 DIAGNOSIS — N186 End stage renal disease: Secondary | ICD-10-CM | POA: Diagnosis not present

## 2018-11-16 DIAGNOSIS — S3993XA Unspecified injury of pelvis, initial encounter: Secondary | ICD-10-CM | POA: Diagnosis not present

## 2018-11-16 DIAGNOSIS — I4892 Unspecified atrial flutter: Secondary | ICD-10-CM | POA: Insufficient documentation

## 2018-11-16 DIAGNOSIS — M25519 Pain in unspecified shoulder: Secondary | ICD-10-CM | POA: Diagnosis not present

## 2018-11-16 DIAGNOSIS — R000 Tachycardia, unspecified: Secondary | ICD-10-CM | POA: Diagnosis not present

## 2018-11-16 DIAGNOSIS — E1122 Type 2 diabetes mellitus with diabetic chronic kidney disease: Secondary | ICD-10-CM | POA: Diagnosis not present

## 2018-11-16 DIAGNOSIS — I132 Hypertensive heart and chronic kidney disease with heart failure and with stage 5 chronic kidney disease, or end stage renal disease: Secondary | ICD-10-CM | POA: Diagnosis not present

## 2018-11-16 DIAGNOSIS — M545 Low back pain, unspecified: Secondary | ICD-10-CM

## 2018-11-16 DIAGNOSIS — S3991XA Unspecified injury of abdomen, initial encounter: Secondary | ICD-10-CM | POA: Diagnosis not present

## 2018-11-16 DIAGNOSIS — Z79899 Other long term (current) drug therapy: Secondary | ICD-10-CM | POA: Diagnosis not present

## 2018-11-16 DIAGNOSIS — Z7901 Long term (current) use of anticoagulants: Secondary | ICD-10-CM | POA: Insufficient documentation

## 2018-11-16 DIAGNOSIS — Z992 Dependence on renal dialysis: Secondary | ICD-10-CM | POA: Diagnosis not present

## 2018-11-16 DIAGNOSIS — R102 Pelvic and perineal pain: Secondary | ICD-10-CM | POA: Diagnosis not present

## 2018-11-16 DIAGNOSIS — S199XXA Unspecified injury of neck, initial encounter: Secondary | ICD-10-CM | POA: Diagnosis not present

## 2018-11-16 DIAGNOSIS — M25512 Pain in left shoulder: Secondary | ICD-10-CM | POA: Diagnosis not present

## 2018-11-16 DIAGNOSIS — M549 Dorsalgia, unspecified: Secondary | ICD-10-CM | POA: Diagnosis present

## 2018-11-16 DIAGNOSIS — R079 Chest pain, unspecified: Secondary | ICD-10-CM | POA: Diagnosis not present

## 2018-11-16 DIAGNOSIS — S0990XA Unspecified injury of head, initial encounter: Secondary | ICD-10-CM | POA: Diagnosis not present

## 2018-11-16 DIAGNOSIS — R52 Pain, unspecified: Secondary | ICD-10-CM | POA: Diagnosis not present

## 2018-11-16 DIAGNOSIS — S299XXA Unspecified injury of thorax, initial encounter: Secondary | ICD-10-CM | POA: Diagnosis not present

## 2018-11-16 DIAGNOSIS — I509 Heart failure, unspecified: Secondary | ICD-10-CM | POA: Insufficient documentation

## 2018-11-16 DIAGNOSIS — R51 Headache: Secondary | ICD-10-CM | POA: Diagnosis not present

## 2018-11-16 DIAGNOSIS — S3992XA Unspecified injury of lower back, initial encounter: Secondary | ICD-10-CM | POA: Diagnosis not present

## 2018-11-16 LAB — TYPE AND SCREEN
ABO/RH(D): A POS
Antibody Screen: NEGATIVE

## 2018-11-16 LAB — COMPREHENSIVE METABOLIC PANEL
ALT: 16 U/L (ref 0–44)
AST: 14 U/L — ABNORMAL LOW (ref 15–41)
Albumin: 3.6 g/dL (ref 3.5–5.0)
Alkaline Phosphatase: 87 U/L (ref 38–126)
Anion gap: 19 — ABNORMAL HIGH (ref 5–15)
BUN: 33 mg/dL — ABNORMAL HIGH (ref 6–20)
CO2: 24 mmol/L (ref 22–32)
Calcium: 9 mg/dL (ref 8.9–10.3)
Chloride: 96 mmol/L — ABNORMAL LOW (ref 98–111)
Creatinine, Ser: 9.94 mg/dL — ABNORMAL HIGH (ref 0.61–1.24)
GFR calc Af Amer: 6 mL/min — ABNORMAL LOW (ref >60)
GFR calc non Af Amer: 5 mL/min — ABNORMAL LOW (ref >60)
Glucose, Bld: 83 mg/dL (ref 70–99)
Potassium: 4.2 mmol/L (ref 3.5–5.1)
Sodium: 139 mmol/L (ref 135–145)
Total Bilirubin: 0.8 mg/dL (ref 0.3–1.2)
Total Protein: 8.6 g/dL — ABNORMAL HIGH (ref 6.5–8.1)

## 2018-11-16 LAB — CBC WITH DIFFERENTIAL/PLATELET
Abs Immature Granulocytes: 0.04 10*3/uL (ref 0.00–0.07)
Basophils Absolute: 0 10*3/uL (ref 0.0–0.1)
Basophils Relative: 1 %
Eosinophils Absolute: 0.2 10*3/uL (ref 0.0–0.5)
Eosinophils Relative: 3 %
HCT: 38 % — ABNORMAL LOW (ref 39.0–52.0)
Hemoglobin: 12 g/dL — ABNORMAL LOW (ref 13.0–17.0)
Immature Granulocytes: 1 %
Lymphocytes Relative: 22 %
Lymphs Abs: 1.6 10*3/uL (ref 0.7–4.0)
MCH: 31.4 pg (ref 26.0–34.0)
MCHC: 31.6 g/dL (ref 30.0–36.0)
MCV: 99.5 fL (ref 80.0–100.0)
Monocytes Absolute: 0.7 10*3/uL (ref 0.1–1.0)
Monocytes Relative: 9 %
Neutro Abs: 4.7 10*3/uL (ref 1.7–7.7)
Neutrophils Relative %: 64 %
Platelets: 259 10*3/uL (ref 150–400)
RBC: 3.82 MIL/uL — ABNORMAL LOW (ref 4.22–5.81)
RDW: 17.1 % — ABNORMAL HIGH (ref 11.5–15.5)
WBC: 7.3 10*3/uL (ref 4.0–10.5)
nRBC: 0 % (ref 0.0–0.2)

## 2018-11-16 LAB — PROTIME-INR
INR: 3.8 — ABNORMAL HIGH (ref 0.8–1.2)
Prothrombin Time: 37 seconds — ABNORMAL HIGH (ref 11.4–15.2)

## 2018-11-16 MED ORDER — IOHEXOL 300 MG/ML  SOLN
100.0000 mL | Freq: Once | INTRAMUSCULAR | Status: AC | PRN
  Administered 2018-11-16: 100 mL via INTRAVENOUS

## 2018-11-16 MED ORDER — MIDODRINE HCL 5 MG PO TABS
10.0000 mg | ORAL_TABLET | Freq: Three times a day (TID) | ORAL | Status: DC
  Administered 2018-11-16: 10 mg via ORAL
  Filled 2018-11-16 (×3): qty 2

## 2018-11-16 MED ORDER — FENTANYL CITRATE (PF) 100 MCG/2ML IJ SOLN
25.0000 ug | Freq: Once | INTRAMUSCULAR | Status: AC
  Administered 2018-11-16: 25 ug via INTRAVENOUS
  Filled 2018-11-16: qty 2

## 2018-11-16 MED ORDER — OXYCODONE-ACETAMINOPHEN 5-325 MG PO TABS: 2.0000 | ORAL_TABLET | Freq: Four times a day (QID) | ORAL | 0 refills | Status: DC | PRN
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
## 2018-11-16 NOTE — ED Notes (Signed)
Pt was able to ambulate to the bathroom and back with no assistance.

## 2018-11-16 NOTE — ED Notes (Signed)
Pt given discharge instructions and follow up information. Pt given the opportunity to ask questions. Pt discharged from the ED without incident.
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
## 2018-11-17 DIAGNOSIS — D631 Anemia in chronic kidney disease: Secondary | ICD-10-CM | POA: Diagnosis not present

## 2018-11-17 DIAGNOSIS — E1129 Type 2 diabetes mellitus with other diabetic kidney complication: Secondary | ICD-10-CM | POA: Diagnosis not present

## 2018-11-17 DIAGNOSIS — Z992 Dependence on renal dialysis: Secondary | ICD-10-CM | POA: Diagnosis not present

## 2018-11-17 DIAGNOSIS — N2581 Secondary hyperparathyroidism of renal origin: Secondary | ICD-10-CM | POA: Diagnosis not present

## 2018-11-17 DIAGNOSIS — N186 End stage renal disease: Secondary | ICD-10-CM | POA: Diagnosis not present

## 2018-11-20 DIAGNOSIS — N2581 Secondary hyperparathyroidism of renal origin: Secondary | ICD-10-CM | POA: Diagnosis not present

## 2018-11-20 DIAGNOSIS — D631 Anemia in chronic kidney disease: Secondary | ICD-10-CM | POA: Diagnosis not present

## 2018-11-20 DIAGNOSIS — E1129 Type 2 diabetes mellitus with other diabetic kidney complication: Secondary | ICD-10-CM | POA: Diagnosis not present

## 2018-11-20 DIAGNOSIS — Z992 Dependence on renal dialysis: Secondary | ICD-10-CM | POA: Diagnosis not present

## 2018-11-20 DIAGNOSIS — N186 End stage renal disease: Secondary | ICD-10-CM | POA: Diagnosis not present

## 2018-11-21 DIAGNOSIS — R000 Tachycardia, unspecified: Secondary | ICD-10-CM | POA: Diagnosis not present

## 2018-11-21 DIAGNOSIS — N186 End stage renal disease: Secondary | ICD-10-CM | POA: Diagnosis not present

## 2018-11-21 DIAGNOSIS — I5022 Chronic systolic (congestive) heart failure: Secondary | ICD-10-CM | POA: Diagnosis not present

## 2018-11-21 DIAGNOSIS — I120 Hypertensive chronic kidney disease with stage 5 chronic kidney disease or end stage renal disease: Secondary | ICD-10-CM | POA: Diagnosis not present

## 2018-11-21 DIAGNOSIS — Z7901 Long term (current) use of anticoagulants: Secondary | ICD-10-CM | POA: Diagnosis not present

## 2018-11-21 DIAGNOSIS — I2510 Atherosclerotic heart disease of native coronary artery without angina pectoris: Secondary | ICD-10-CM | POA: Diagnosis not present

## 2018-11-22 ENCOUNTER — Observation Stay (HOSPITAL_COMMUNITY)
Admission: AD | Admit: 2018-11-22 | Discharge: 2018-11-23 | Disposition: A | Discharge status: Home / Self Care | Payer: Medicare Other | Source: Ambulatory Visit | Attending: Cardiovascular Disease | Admitting: Cardiovascular Disease

## 2018-11-22 ENCOUNTER — Observation Stay (HOSPITAL_COMMUNITY): Payer: Medicare Other

## 2018-11-22 DIAGNOSIS — R0602 Shortness of breath: Secondary | ICD-10-CM | POA: Diagnosis not present

## 2018-11-22 DIAGNOSIS — Z905 Acquired absence of kidney: Secondary | ICD-10-CM | POA: Insufficient documentation

## 2018-11-22 DIAGNOSIS — M109 Gout, unspecified: Secondary | ICD-10-CM | POA: Diagnosis not present

## 2018-11-22 DIAGNOSIS — I429 Cardiomyopathy, unspecified: Secondary | ICD-10-CM | POA: Diagnosis not present

## 2018-11-22 DIAGNOSIS — N2581 Secondary hyperparathyroidism of renal origin: Secondary | ICD-10-CM | POA: Diagnosis not present

## 2018-11-22 DIAGNOSIS — I5022 Chronic systolic (congestive) heart failure: Secondary | ICD-10-CM | POA: Diagnosis not present

## 2018-11-22 DIAGNOSIS — I959 Hypotension, unspecified: Secondary | ICD-10-CM | POA: Diagnosis not present

## 2018-11-22 DIAGNOSIS — I120 Hypertensive chronic kidney disease with stage 5 chronic kidney disease or end stage renal disease: Secondary | ICD-10-CM | POA: Diagnosis not present

## 2018-11-22 DIAGNOSIS — E860 Dehydration: Secondary | ICD-10-CM | POA: Insufficient documentation

## 2018-11-22 DIAGNOSIS — D638 Anemia in other chronic diseases classified elsewhere: Secondary | ICD-10-CM | POA: Diagnosis not present

## 2018-11-22 DIAGNOSIS — Z79899 Other long term (current) drug therapy: Secondary | ICD-10-CM | POA: Insufficient documentation

## 2018-11-22 DIAGNOSIS — E669 Obesity, unspecified: Secondary | ICD-10-CM | POA: Diagnosis not present

## 2018-11-22 DIAGNOSIS — Z6831 Body mass index (BMI) 31.0-31.9, adult: Secondary | ICD-10-CM | POA: Diagnosis not present

## 2018-11-22 DIAGNOSIS — D631 Anemia in chronic kidney disease: Secondary | ICD-10-CM | POA: Diagnosis not present

## 2018-11-22 DIAGNOSIS — Z992 Dependence on renal dialysis: Secondary | ICD-10-CM | POA: Diagnosis not present

## 2018-11-22 DIAGNOSIS — I509 Heart failure, unspecified: Secondary | ICD-10-CM | POA: Diagnosis not present

## 2018-11-22 DIAGNOSIS — I080 Rheumatic disorders of both mitral and aortic valves: Secondary | ICD-10-CM | POA: Diagnosis not present

## 2018-11-22 DIAGNOSIS — E119 Type 2 diabetes mellitus without complications: Secondary | ICD-10-CM

## 2018-11-22 DIAGNOSIS — Z8249 Family history of ischemic heart disease and other diseases of the circulatory system: Secondary | ICD-10-CM | POA: Insufficient documentation

## 2018-11-22 DIAGNOSIS — I4892 Unspecified atrial flutter: Secondary | ICD-10-CM | POA: Insufficient documentation

## 2018-11-22 DIAGNOSIS — N186 End stage renal disease: Secondary | ICD-10-CM

## 2018-11-22 DIAGNOSIS — E1122 Type 2 diabetes mellitus with diabetic chronic kidney disease: Secondary | ICD-10-CM | POA: Diagnosis not present

## 2018-11-22 DIAGNOSIS — Z7901 Long term (current) use of anticoagulants: Secondary | ICD-10-CM | POA: Insufficient documentation

## 2018-11-22 DIAGNOSIS — H5461 Unqualified visual loss, right eye, normal vision left eye: Secondary | ICD-10-CM | POA: Diagnosis not present

## 2018-11-22 DIAGNOSIS — I2510 Atherosclerotic heart disease of native coronary artery without angina pectoris: Secondary | ICD-10-CM | POA: Insufficient documentation

## 2018-11-22 DIAGNOSIS — Z888 Allergy status to other drugs, medicaments and biological substances status: Secondary | ICD-10-CM | POA: Insufficient documentation

## 2018-11-22 DIAGNOSIS — Z841 Family history of disorders of kidney and ureter: Secondary | ICD-10-CM | POA: Insufficient documentation

## 2018-11-22 DIAGNOSIS — M1A9XX0 Chronic gout, unspecified, without tophus (tophi): Secondary | ICD-10-CM | POA: Diagnosis present

## 2018-11-22 DIAGNOSIS — I252 Old myocardial infarction: Secondary | ICD-10-CM | POA: Insufficient documentation

## 2018-11-22 DIAGNOSIS — Z85528 Personal history of other malignant neoplasm of kidney: Secondary | ICD-10-CM | POA: Insufficient documentation

## 2018-11-22 DIAGNOSIS — D689 Coagulation defect, unspecified: Secondary | ICD-10-CM | POA: Diagnosis not present

## 2018-11-22 DIAGNOSIS — E1129 Type 2 diabetes mellitus with other diabetic kidney complication: Secondary | ICD-10-CM | POA: Diagnosis not present

## 2018-11-22 DIAGNOSIS — I340 Nonrheumatic mitral (valve) insufficiency: Secondary | ICD-10-CM | POA: Diagnosis not present

## 2018-11-22 DIAGNOSIS — Z20828 Contact with and (suspected) exposure to other viral communicable diseases: Secondary | ICD-10-CM | POA: Insufficient documentation

## 2018-11-22 DIAGNOSIS — I480 Paroxysmal atrial fibrillation: Secondary | ICD-10-CM | POA: Diagnosis not present

## 2018-11-22 DIAGNOSIS — I428 Other cardiomyopathies: Secondary | ICD-10-CM | POA: Diagnosis not present

## 2018-11-22 DIAGNOSIS — Z8673 Personal history of transient ischemic attack (TIA), and cerebral infarction without residual deficits: Secondary | ICD-10-CM | POA: Insufficient documentation

## 2018-11-22 DIAGNOSIS — I420 Dilated cardiomyopathy: Secondary | ICD-10-CM | POA: Diagnosis not present

## 2018-11-22 DIAGNOSIS — K219 Gastro-esophageal reflux disease without esophagitis: Secondary | ICD-10-CM | POA: Diagnosis not present

## 2018-11-22 DIAGNOSIS — I132 Hypertensive heart and chronic kidney disease with heart failure and with stage 5 chronic kidney disease, or end stage renal disease: Secondary | ICD-10-CM | POA: Diagnosis not present

## 2018-11-22 DIAGNOSIS — R42 Dizziness and giddiness: Secondary | ICD-10-CM

## 2018-11-22 HISTORY — DX: Dizziness and giddiness: R42

## 2018-11-22 LAB — COMPREHENSIVE METABOLIC PANEL
ALT: 15 U/L (ref 0–44)
AST: 15 U/L (ref 15–41)
Albumin: 3.8 g/dL (ref 3.5–5.0)
Alkaline Phosphatase: 94 U/L (ref 38–126)
Anion gap: 15 (ref 5–15)
BUN: 16 mg/dL (ref 6–20)
CO2: 28 mmol/L (ref 22–32)
Calcium: 8.8 mg/dL — ABNORMAL LOW (ref 8.9–10.3)
Chloride: 95 mmol/L — ABNORMAL LOW (ref 98–111)
Creatinine, Ser: 7.36 mg/dL — ABNORMAL HIGH (ref 0.61–1.24)
GFR calc Af Amer: 9 mL/min — ABNORMAL LOW (ref >60)
GFR calc non Af Amer: 8 mL/min — ABNORMAL LOW (ref >60)
Glucose, Bld: 89 mg/dL (ref 70–99)
Potassium: 4.3 mmol/L (ref 3.5–5.1)
Sodium: 138 mmol/L (ref 135–145)
Total Bilirubin: 0.8 mg/dL (ref 0.3–1.2)
Total Protein: 9.2 g/dL — ABNORMAL HIGH (ref 6.5–8.1)

## 2018-11-22 LAB — CBC WITH DIFFERENTIAL/PLATELET
Abs Immature Granulocytes: 0.07 10*3/uL (ref 0.00–0.07)
Basophils Absolute: 0.1 10*3/uL (ref 0.0–0.1)
Basophils Relative: 1 %
Eosinophils Absolute: 0.2 10*3/uL (ref 0.0–0.5)
Eosinophils Relative: 3 %
HCT: 39.5 % (ref 39.0–52.0)
Hemoglobin: 12.6 g/dL — ABNORMAL LOW (ref 13.0–17.0)
Immature Granulocytes: 1 %
Lymphocytes Relative: 23 %
Lymphs Abs: 1.5 10*3/uL (ref 0.7–4.0)
MCH: 31.3 pg (ref 26.0–34.0)
MCHC: 31.9 g/dL (ref 30.0–36.0)
MCV: 98.3 fL (ref 80.0–100.0)
Monocytes Absolute: 0.6 10*3/uL (ref 0.1–1.0)
Monocytes Relative: 9 %
Neutro Abs: 4.2 10*3/uL (ref 1.7–7.7)
Neutrophils Relative %: 63 %
Platelets: 233 10*3/uL (ref 150–400)
RBC: 4.02 MIL/uL — ABNORMAL LOW (ref 4.22–5.81)
RDW: 16.9 % — ABNORMAL HIGH (ref 11.5–15.5)
WBC: 6.6 10*3/uL (ref 4.0–10.5)
nRBC: 0 % (ref 0.0–0.2)

## 2018-11-22 LAB — PROTIME-INR: Protime: 26.6 — AB (ref 10.0–13.8)

## 2018-11-22 LAB — POCT INR: INR: 2.5 — AB (ref <=1.1)

## 2018-11-22 LAB — SARS CORONAVIRUS 2 BY RT PCR (HOSPITAL ORDER, PERFORMED IN ~~LOC~~ HOSPITAL LAB): SARS Coronavirus 2: NEGATIVE

## 2018-11-22 LAB — TROPONIN I (HIGH SENSITIVITY): Troponin I (High Sensitivity): 4 ng/L (ref <18)

## 2018-11-22 MED ORDER — CALCIUM ACETATE (PHOS BINDER) 667 MG PO CAPS: 667.0000 mg | ORAL_CAPSULE | ORAL | Status: DC

## 2018-11-22 MED ORDER — ALLOPURINOL 100 MG PO TABS: 100.0000 mg | ORAL_TABLET | Freq: Every day | ORAL | Status: DC

## 2018-11-22 MED ORDER — PANTOPRAZOLE SODIUM 40 MG PO TBEC
40.0000 mg | DELAYED_RELEASE_TABLET | Freq: Two times a day (BID) | ORAL | Status: DC
  Administered 2018-11-22 – 2018-11-23 (×2): 40 mg via ORAL
  Filled 2018-11-22 (×2): qty 1

## 2018-11-22 MED ORDER — ALLOPURINOL 100 MG PO TABS
100.0000 mg | ORAL_TABLET | Freq: Every day | ORAL | Status: DC
  Administered 2018-11-23: 100 mg via ORAL
  Filled 2018-11-22: qty 1

## 2018-11-22 MED ORDER — SODIUM CHLORIDE 0.9 % IV SOLN
INTRAVENOUS | Status: AC
  Administered 2018-11-22: 22:00:00 via INTRAVENOUS

## 2018-11-22 MED ORDER — CALCIUM ACETATE (PHOS BINDER) 667 MG PO CAPS
2001.0000 mg | ORAL_CAPSULE | Freq: Three times a day (TID) | ORAL | Status: DC
  Filled 2018-11-22 (×2): qty 3

## 2018-11-22 MED ORDER — RENA-VITE PO TABS
1.0000 | ORAL_TABLET | Freq: Every day | ORAL | Status: DC
  Administered 2018-11-22: 1 via ORAL
  Filled 2018-11-22: qty 1

## 2018-11-22 MED ORDER — AMIODARONE HCL 200 MG PO TABS
100.0000 mg | ORAL_TABLET | Freq: Every day | ORAL | Status: DC
  Administered 2018-11-23: 100 mg via ORAL
  Filled 2018-11-22: qty 1

## 2018-11-22 MED ORDER — MECLIZINE HCL 25 MG PO TABS: 25.0000 mg | ORAL_TABLET | Freq: Three times a day (TID) | ORAL | Status: DC | PRN

## 2018-11-22 NOTE — Progress Notes (Addendum)
[name] [id number] Admission Data: 11/22/2018 4:49 PM Attending Provider: Dixie Dials, MD  BRA:[id number], Sandrea Matte Consults/ Treatment Team:   [name] is a 58 y.o. male, awake, alert  & orientated  X 4, Blood pressure (!) 83/64, pulse (!) 105, temperature 97.6 F (36.4 C), temperature source Oral, resp. rate 20, height 6\' 1"  (1.854 m), weight 109.7 kg, SpO2 100 %., on RA, no c/o shortness of breath, no c/o chest pain, no distress noted.    Allergies:   Allergies  Allergen Reactions  . Ace Inhibitors Itching and Cough     Past Medical History:  Diagnosis Date  . Acute respiratory failure with hypoxia ([place]) 01/13/2015  . Atrial flutter ([place])   . Bile leak, postoperative 03/15/2016  . Biliary dyskinesia 02/12/2016  . 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 Cardiomyopathy   . CHF (congestive heart failure) ([place])   . Coronary artery disease    normal coronaries by 10/10/08 cath, Cardiac Cath 08-04-12 epic.Dr. Doylene Canard follows  . Diabetes mellitus    pt. states he's borderline diabetic., no longer taking med,- off med. since 2013  . Dialysis patient [facility])    Mon-Wed-Fri([place])- Left AV fistula  . Diverticulitis November 2016   reoccurred in December 2016  . Diverticulitis of intestine without perforation or abscess without bleeding 04/21/2015  . ESRD (end stage renal disease) ([place])   . GERD (gastroesophageal reflux disease)   . Gout   . Hemorrhage of left kidney 01/13/2018  . History of nephrectomy 07/04/2012   History of right nephrectomy in 2002 for renal cell carcinoma   . History of unilateral nephrectomy   . Hypertension   . Low iron   . Myocardial infarction [facility]) ?2006  . Renal cell carcinoma    dialysis- MWF- Dr. Mercy Moore follows.  . Renal insufficiency   . Shortness of breath 05/19/11   "at rest, lying down, w/exertion"  . Stroke [facility]) 02/2011   05/19/11 denies residual  . Umbilical hernia 58/59/29   unrepaired     Pt orientation to unit, room and routine. Information packet given to patient/family and safety video watched.  Admission INP armband ID verified with patient/family, and in place. SR up x 2, fall risk assessment complete with Patient and family verbalizing understanding of risks associated with falls. Pt verbalizes an understanding of how to use the call bell and to call for help before getting out of bed.  Skin, clean-dry- intact without evidence of bruising, or skin tears.    Dr. Doylene Canard office called; awaiting orders.    Will cont to monitor and assist as needed.  Hiram Comber, RN 11/22/2018 4:49 PM

## 2018-11-22 NOTE — Progress Notes (Signed)
Patient off unit to x-ray.  Hiram Comber, RN 11/22/2018 6:04 PM

## 2018-11-22 NOTE — Progress Notes (Signed)
Unable to place PIV line; IV team consult placed to obtain IV access. Will administer IVF when access established.    Hiram Comber, RN 11/22/2018 6:46 PM

## 2018-11-22 NOTE — H&P (Signed)
Referring Physician:  [name] is an 58 y.o. male.                       Chief Complaint: Dizziness  HPI: 58 years old male feels weak and dizzy. He denies rectal bleed. He has PMH of ESRD, atrial fibrillation, cardiomyopathy, CAD, obesity and type 2 DM. He denies chest pain but has some shortness of breath.   Past Medical History:  Diagnosis Date  . Acute respiratory failure with hypoxia ([place]) 01/13/2015  . Atrial flutter ([place])   . Bile leak, postoperative 03/15/2016  . Biliary dyskinesia 02/12/2016  . 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 Cardiomyopathy   . CHF (congestive heart failure) ([place])   . Coronary artery disease    normal coronaries by 10/10/08 cath, Cardiac Cath 08-04-12 epic.Dr. Doylene Canard follows  . Diabetes mellitus    pt. states he's borderline diabetic., no longer taking med,- off med. since 2013  . Dialysis patient [facility])    Mon-Wed-Fri([place])- Left AV fistula  . Diverticulitis November 2016   reoccurred in December 2016  . Diverticulitis of intestine without perforation or abscess without bleeding 04/21/2015  . ESRD (end stage renal disease) ([place])   . GERD (gastroesophageal reflux disease)   . Gout   . Hemorrhage of left kidney 01/13/2018  . History of nephrectomy 07/04/2012   History of right nephrectomy in 2002 for renal cell carcinoma   . History of unilateral nephrectomy   . Hypertension   . Low iron   . Myocardial infarction [facility]) ?2006  . Renal cell carcinoma    dialysis- MWF- Dr. Mercy Moore follows.  . Renal insufficiency   . Shortness of breath 05/19/11   "at rest, lying down, w/exertion"  . Stroke [facility]) 02/2011   05/19/11 denies residual  . Umbilical hernia 16/96/78   unrepaired      Past Surgical History:  Procedure Laterality Date  . ANAL FISTULOTOMY  08/15/2018  . AV FISTULA PLACEMENT  03/29/2011   Procedure: ARTERIOVENOUS (AV) FISTULA CREATION;  Surgeon: Hinda Lenis, MD;  Location: [place];   Service: Vascular;  Laterality: Left;  LEFT RADIOCEPHALIC , Arteriovenous [id number])  . CARDIAC CATHETERIZATION  05/21/11  . CARDIAC CATHETERIZATION N/A 03/16/2016   Procedure: Left Heart Cath and Coronary Angiography;  Surgeon: Dixie Dials, MD;  Location: [place] CV LAB;  Service: Cardiovascular;  Laterality: N/A;  . CHOLECYSTECTOMY N/A 02/12/2016   Procedure: LAPAROSCOPIC CHOLECYSTECTOMY WITH INTRAOPERATIVE CHOLANGIOGRAM;  Surgeon: Jackolyn Confer, MD;  Location: [place];  Service: General;  Laterality: N/A;  . COLONOSCOPY WITH PROPOFOL N/A 02/01/2017   Procedure: COLONOSCOPY WITH PROPOFOL;  Surgeon: Juanita Craver, MD;  Location: [facility];  Service: Endoscopy;  Laterality: N/A;  . dialysis cath placed    . ESOPHAGOGASTRODUODENOSCOPY (EGD) WITH PROPOFOL N/A 12/02/2015   Procedure: ESOPHAGOGASTRODUODENOSCOPY (EGD) WITH PROPOFOL;  Surgeon: Juanita Craver, MD;  Location: [facility];  Service: Endoscopy;  Laterality: N/A;  . EVALUATION UNDER ANESTHESIA WITH HEMORRHOIDECTOMY N/A 08/15/2018   Procedure: HEMORRHOID LIGATION/PEXY;  Surgeon: Michael Boston, MD;  Location: [place];  Service: General;  Laterality: N/A;  . FINGER SURGERY     L pinkie finger- ORIF- /w remaining hardware - 1990's    . FISTULOTOMY N/A 08/15/2018   Procedure: SUPERFICIAL ANAL FISTULOTOMY;  Surgeon: Michael Boston, MD;  Location: [place];  Service: General;  Laterality: N/A;  . HEMORRHOID SURGERY    . HERNIA REPAIR N/A 07/04/2012  Umbilical hernia repair  . INSERTION OF DIALYSIS CATHETER N/A 01/27/2016   Procedure: INSERTION OF DIALYSIS CATHETER;  Surgeon: Waynetta Sandy, MD;  Location: [place];  Service: Vascular;  Laterality: N/A;  . INSERTION OF MESH N/A 07/04/2012   Procedure: INSERTION OF MESH;  Surgeon: Madilyn Hook, DO;  Location: [place];  Service: General;  Laterality: N/A;  . IR EMBO ART  VEN HEMORR LYMPH EXTRAV  INC GUIDE ROADMAPPING  01/13/2018  . IR FLUORO GUIDE CV LINE RIGHT  01/13/2018  . IR IVC FILTER PLMT /  S&I /IMG GUID/MOD SED  01/27/2018  . IR RENAL SELECTIVE  UNI INC S&I MOD SED  01/13/2018  . IR US GUIDE VASC ACCESS RIGHT  01/13/2018  . KIDNEY CYST REMOVAL  2019  . LAPAROSCOPIC LYSIS OF ADHESIONS N/A 02/12/2016   Procedure: LAPAROSCOPIC LYSIS OF ADHESIONS;  Surgeon: Jackolyn Confer, MD;  Location: [place];  Service: General;  Laterality: N/A;  . LEFT AND RIGHT HEART CATHETERIZATION WITH CORONARY ANGIOGRAM N/A 08/04/2012   Procedure: LEFT AND RIGHT HEART CATHETERIZATION WITH CORONARY ANGIOGRAM;  Surgeon: Birdie Riddle, MD;  Location: [place] CATH LAB;  Service: Cardiovascular;  Laterality: N/A;  . NEPHRECTOMY  2000   right  . REVISON OF ARTERIOVENOUS FISTULA Left 06/05/2013   Procedure: REVISON OF LEFT RADIOCEPHALIC  ARTERIOVENOUS FISTULA;  Surgeon: Conrad [place], MD;  Location: [place];  Service: Vascular;  Laterality: Left;  . REVISON OF ARTERIOVENOUS FISTULA Left 01/27/2016   Procedure: REVISION OF LEFT UPPER EXTREMITY ARTERIOVENOUS FISTULA;  Surgeon: Waynetta Sandy, MD;  Location: [place];  Service: Vascular;  Laterality: Left;  . REVISON OF ARTERIOVENOUS FISTULA Left 08/03/2016   Procedure: REVISON OF Left arm ARTERIOVENOUS FISTULA;  Surgeon: Elam Dutch, MD;  Location: [place];  Service: Vascular;  Laterality: Left;  . REVISON OF ARTERIOVENOUS FISTULA Left 05/06/2017   Procedure: REVISION PLICATION OF ARTERIOVENOUS FISTULA LEFT ARM;  Surgeon: Rosetta Posner, MD;  Location: [place];  Service: Vascular;  Laterality: Left;  . RIGHT HEART CATHETERIZATION N/A 05/21/2011   Procedure: RIGHT HEART CATH;  Surgeon: Birdie Riddle, MD;  Location: [facility] CATH LAB;  Service: Cardiovascular;  Laterality: N/A;  . smashed  1990's   "left pinky; have a plate in there"  . UMBILICAL HERNIA REPAIR N/A 07/04/2012   Procedure: LAPAROSCOPIC UMBILICAL HERNIA;  Surgeon: Madilyn Hook, DO;  Location: [place];  Service: General;  Laterality: N/A;  . US ECHOCARDIOGRAPHY  05/20/11    Family History  Problem Relation Age of Onset   . Hypertension Mother   . Kidney disease Mother    Social History:  reports that he has never smoked. He has never used smokeless tobacco. He reports that he does not drink alcohol or use drugs.  Allergies:  Allergies  Allergen Reactions  . Ace Inhibitors Itching and Cough    Medications Prior to Admission  Medication Sig Dispense Refill  . acetaminophen (TYLENOL) 325 MG tablet Take 2 tablets (650 mg total) by mouth every 6 (six) hours as needed for mild pain (or Fever >/= 101).    Marland Kitchen amiodarone (PACERONE) 200 MG tablet Take 0.5 tablets (100 mg total) by mouth daily. 30 tablet 1  . calcium acetate (PHOSLO) 667 MG capsule Take 667-2,001 mg by mouth See admin instructions. Take 2001 mg capsules with meals and 667 mg capsule with snacks.    . chlorhexidine (PERIDEX) 0.12 % solution 15 mLs by Mouth Rinse route daily as needed.     Marland Kitchen  doxercalciferol (HECTOROL) 4 MCG/2ML injection Inject 1.5 mLs (3 mcg total) into the vein every Monday, Wednesday, and Friday with hemodialysis.    . ferric citrate (AURYXIA) 1 GM 210 MG(Fe) tablet Take 210 mg by mouth 3 (three) times daily with meals.     . isosorbide dinitrate (ISORDIL) 20 MG tablet Take 20 mg by mouth 2 (two) times daily.    . Lidocaine, Anorectal, (L-M-X 5) 5 % CREA Apply 1 application topically 4 (four) times daily as needed (apply to area up to 6 times daily as needed). 1 Tube 0  . lidocaine-prilocaine (EMLA) cream Apply 1 application topically See admin instructions. Apply topically to port access prior to dialysis - Monday, Wednesday, Friday  12  . meclizine (ANTIVERT) 25 MG tablet Take 1 tablet (25 mg total) by mouth 3 (three) times daily as needed for dizziness. 30 tablet 0  . midodrine (PROAMATINE) 10 MG tablet 30 mg  on the days of dyalisis three times a week, and only takes 10 mg on off dyalisis days. (Patient taking differently: Take 10-15 mg by mouth See admin instructions. 15 mg  on the days of dialysis three times a week, and only  takes 10 mg on off dialysis days.) 1 tablet 0  . mirtazapine (REMERON) 7.5 MG tablet Take 1 tablet (7.5 mg total) by mouth at bedtime. 30 tablet 2  . multivitamin (RENA-VIT) TABS tablet Take 1 tablet by mouth at bedtime.    . nitroGLYCERIN (NITROSTAT) 0.4 MG SL tablet Place 0.4 mg under the tongue every 5 (five) minutes as needed for chest pain.   0  . oxyCODONE-acetaminophen (PERCOCET/ROXICET) 5-325 MG tablet Take 2 tablets by mouth every 6 (six) hours as needed for severe pain. 15 tablet 0  . pantoprazole (PROTONIX) 40 MG tablet Take 40 mg by mouth 2 (two) times daily.    Marland Kitchen UNABLE TO FIND Out patient physical therapy- vestibular therapy for BPPV 1 each 0  . warfarin (COUMADIN) 10 MG tablet Take 1 tablet (10 mg total) by mouth daily at 6 PM.      No results found for this or any previous visit (from the past 48 hour(s)). No results found.  Review Of Systems Constitutional: No fever, chills, weight loss or gain. Eyes: No vision change, wears glasses. No discharge or pain. Ears: No hearing loss, No tinnitus. Respiratory: No asthma, COPD, pneumonias. No shortness of breath. No hemoptysis. Cardiovascular: Positive chest pain, palpitation, leg edema. Gastrointestinal: No nausea, vomiting, diarrhea, constipation. Positive GI bleed. No hepatitis. Genitourinary: No dysuria, hematuria, kidney stone. No incontinance. Neurological: No headache, stroke, seizures. Positive dizziness more so positional vertigo. Psychiatry: No psych facility admission for anxiety, depression, suicide. No detox. Skin: No rash. Musculoskeletal: Positive joint pain, fibromyalgia. No neck pain, back pain. Lymphadenopathy: No lymphadenopathy. Hematology: Positive anemia or easy bruising.   Blood pressure (!) 83/64, pulse (!) 105, temperature 97.6 F (36.4 C), temperature source Oral, resp. rate 20, height 6\' 1"  (1.854 m), weight 109.7 kg, SpO2 100 %. Body mass index is 31.91 kg/m. General appearance: alert, cooperative,  appears stated age and mild respiratory distress Head: Normocephalic, atraumatic. Eyes: Brown eyes, pale conjunctiva, corneas clear. PERRL, EOM's intact. Neck: No adenopathy, no carotid bruit, no JVD, supple, symmetrical, trachea midline and thyroid not enlarged. Resp: Clear to auscultation bilaterally. Cardio: Regular rate and rhythm, S1, S2 normal, II/VI systolic murmur, no click, rub or gallop GI: Soft, non-tender; bowel sounds normal; no organomegaly. Extremities: No edema, cyanosis or clubbing. Left forearm AV fistula.  Skin: Warm and dry.  Neurologic: Alert and oriented X 3, normal strength. Normal coordination and slow gait.  Assessment/Plan Dizziness Hypotension R/O GI bleed ESRD Type 2 DM Anemia of chronic disease Dilated cardiomyopathy  Place in observation. Blood work. EKG CXR. IV fluids.  Time spent: Review of old records, Lab, x-rays, EKG, other cardiac tests, examination, discussion with patient over 70 minutes.  Birdie Riddle, MD  11/22/2018, 5:20 PM

## 2018-11-23 ENCOUNTER — Observation Stay (HOSPITAL_COMMUNITY): Payer: Medicare Other

## 2018-11-23 DIAGNOSIS — I420 Dilated cardiomyopathy: Secondary | ICD-10-CM | POA: Diagnosis not present

## 2018-11-23 DIAGNOSIS — I428 Other cardiomyopathies: Secondary | ICD-10-CM | POA: Diagnosis not present

## 2018-11-23 DIAGNOSIS — N186 End stage renal disease: Secondary | ICD-10-CM | POA: Diagnosis not present

## 2018-11-23 DIAGNOSIS — I509 Heart failure, unspecified: Secondary | ICD-10-CM | POA: Diagnosis not present

## 2018-11-23 DIAGNOSIS — E860 Dehydration: Secondary | ICD-10-CM | POA: Diagnosis not present

## 2018-11-23 DIAGNOSIS — Z20828 Contact with and (suspected) exposure to other viral communicable diseases: Secondary | ICD-10-CM | POA: Diagnosis not present

## 2018-11-23 DIAGNOSIS — Z7901 Long term (current) use of anticoagulants: Secondary | ICD-10-CM | POA: Diagnosis not present

## 2018-11-23 DIAGNOSIS — I429 Cardiomyopathy, unspecified: Secondary | ICD-10-CM | POA: Diagnosis not present

## 2018-11-23 DIAGNOSIS — I340 Nonrheumatic mitral (valve) insufficiency: Secondary | ICD-10-CM | POA: Diagnosis not present

## 2018-11-23 DIAGNOSIS — I132 Hypertensive heart and chronic kidney disease with heart failure and with stage 5 chronic kidney disease, or end stage renal disease: Secondary | ICD-10-CM | POA: Diagnosis not present

## 2018-11-23 DIAGNOSIS — I080 Rheumatic disorders of both mitral and aortic valves: Secondary | ICD-10-CM | POA: Diagnosis not present

## 2018-11-23 DIAGNOSIS — I5022 Chronic systolic (congestive) heart failure: Secondary | ICD-10-CM | POA: Diagnosis not present

## 2018-11-23 LAB — BASIC METABOLIC PANEL
Anion gap: 13 (ref 5–15)
BUN: 23 mg/dL — ABNORMAL HIGH (ref 6–20)
CO2: 25 mmol/L (ref 22–32)
Calcium: 8.4 mg/dL — ABNORMAL LOW (ref 8.9–10.3)
Chloride: 99 mmol/L (ref 98–111)
Creatinine, Ser: 8.53 mg/dL — ABNORMAL HIGH (ref 0.61–1.24)
GFR calc Af Amer: 7 mL/min — ABNORMAL LOW (ref >60)
GFR calc non Af Amer: 6 mL/min — ABNORMAL LOW (ref >60)
Glucose, Bld: 90 mg/dL (ref 70–99)
Potassium: 4.1 mmol/L (ref 3.5–5.1)
Sodium: 137 mmol/L (ref 135–145)

## 2018-11-23 LAB — CBC
HCT: 34.6 % — ABNORMAL LOW (ref 39.0–52.0)
Hemoglobin: 11 g/dL — ABNORMAL LOW (ref 13.0–17.0)
MCH: 31.3 pg (ref 26.0–34.0)
MCHC: 31.8 g/dL (ref 30.0–36.0)
MCV: 98.3 fL (ref 80.0–100.0)
Platelets: 215 10*3/uL (ref 150–400)
RBC: 3.52 MIL/uL — ABNORMAL LOW (ref 4.22–5.81)
RDW: 16.7 % — ABNORMAL HIGH (ref 11.5–15.5)
WBC: 6.3 10*3/uL (ref 4.0–10.5)
nRBC: 0 % (ref 0.0–0.2)

## 2018-11-23 LAB — PROTIME-INR
INR: 1.9 — ABNORMAL HIGH (ref 0.8–1.2)
Prothrombin Time: 21.1 seconds — ABNORMAL HIGH (ref 11.4–15.2)

## 2018-11-23 MED ORDER — WARFARIN - PHYSICIAN DOSING INPATIENT: Freq: Every day | Status: DC

## 2018-11-23 MED ORDER — SODIUM CHLORIDE 0.9 % IV SOLN
INTRAVENOUS | Status: AC
  Administered 2018-11-23: 09:00:00 via INTRAVENOUS

## 2018-11-23 MED ORDER — WARFARIN SODIUM 7.5 MG PO TABS: 7.5000 mg | ORAL_TABLET | Freq: Every day | ORAL | Status: DC

## 2018-11-23 MED ORDER — CALCIUM ACETATE (PHOS BINDER) 667 MG PO CAPS: 667.0000 mg | ORAL_CAPSULE | ORAL | Status: AC

## 2018-11-23 MED ORDER — ALLOPURINOL 100 MG PO TABS: 100.0000 mg | ORAL_TABLET | Freq: Every day | ORAL | 3 refills | Status: DC

## 2018-11-23 MED ORDER — WARFARIN SODIUM 2.5 MG PO TABS: 7.5000 mg | ORAL_TABLET | Freq: Every day | ORAL | Status: DC

## 2018-11-23 NOTE — Progress Notes (Signed)
  Echocardiogram 2D Echocardiogram has been performed.  [name] 11/23/2018, 11:36 AM

## 2018-11-23 NOTE — Progress Notes (Signed)
Pt has been on the phone with family members.

## 2018-11-23 NOTE — Progress Notes (Signed)
Nsg Discharge Note  Admit Date:  11/22/2018 Discharge date: 11/23/2018   [name] to be D/C'd Home per MD order.  AVS completed.  Copy for chart, and copy for patient signed, and dated. Patient/caregiver able to verbalize understanding.  Discharge Medication: Allergies as of 11/23/2018      Reactions   Ace Inhibitors Itching, Cough      Medication List    STOP taking these medications   acetaminophen 325 MG tablet Commonly known as: TYLENOL   doxercalciferol 4 MCG/2ML injection Commonly known as: HECTOROL   isosorbide dinitrate 20 MG tablet Commonly known as: ISORDIL   mirtazapine 7.5 MG tablet Commonly known as: REMERON     TAKE these medications   allopurinol 100 MG tablet Commonly known as: ZYLOPRIM Take 1 tablet (100 mg total) by mouth daily. Start taking on: November 24, 2018   amiodarone 200 MG tablet Commonly known as: PACERONE Take 0.5 tablets (100 mg total) by mouth daily.   Auryxia 1 GM 210 MG(Fe) tablet Generic drug: ferric citrate Take 210 mg by mouth 3 (three) times daily with meals.   calcium acetate 667 MG capsule Commonly known as: PHOSLO Take 1-3 capsules (667-2,001 mg total) by mouth See admin instructions. Take 2001 mg capsules with meals three times daily and 667 mg capsule with snacks.   chlorhexidine 0.12 % solution Commonly known as: PERIDEX 15 mLs by Mouth Rinse route daily as needed (dryness).   Lidocaine (Anorectal) 5 % Crea Commonly known as: L-M-X 5 Apply 1 application topically 4 (four) times daily as needed (apply to area up to 6 times daily as needed). What changed:   when to take this  additional instructions   lidocaine-prilocaine cream Commonly known as: EMLA Apply 1 application topically See admin instructions. Apply topically to port access prior to dialysis - Monday, Wednesday, Friday   meclizine 25 MG tablet Commonly known as: ANTIVERT Take 1 tablet (25 mg total) by mouth 3 (three) times daily as needed for  dizziness.   midodrine 10 MG tablet Commonly known as: PROAMATINE 30 mg  on the days of dyalisis three times a week, and only takes 10 mg on off dyalisis days. What changed:   how much to take  how to take this  when to take this  additional instructions   multivitamin Tabs tablet Take 1 tablet by mouth at bedtime.   nitroGLYCERIN 0.4 MG SL tablet Commonly known as: NITROSTAT Place 0.4 mg under the tongue every 5 (five) minutes as needed for chest pain.   oxyCODONE-acetaminophen 5-325 MG tablet Commonly known as: PERCOCET/ROXICET Take 2 tablets by mouth every 6 (six) hours as needed for severe pain.   pantoprazole 40 MG tablet Commonly known as: PROTONIX Take 40 mg by mouth 2 (two) times daily.   UNABLE TO FIND Out patient physical therapy- vestibular therapy for BPPV   warfarin 2.5 MG tablet Commonly known as: COUMADIN Take 3 tablets (7.5 mg total) by mouth daily at 6 PM. What changed:   medication strength  how much to take       Discharge Assessment:  Skin clean, dry and intact without evidence of skin break down, no evidence of skin tears noted. IV catheter discontinued intact. Site without signs and symptoms of complications - no redness or edema noted at insertion site, patient denies c/o pain - only slight tenderness at site.  Dressing with slight pressure applied.  D/c Instructions-Education: Discharge instructions given to patient with verbalized understanding. D/c education completed with  patient including follow up instructions, medication list, d/c activities limitations if indicated, with other d/c instructions as indicated by MD - patient able to verbalize understanding, all questions fully answered. Patient instructed to return to ED, call 911, or call MD for any changes in condition.  Patient escorted via [place], and D/C home via private auto.  Hassan Rowan, RN 11/23/2018 4:32 PM

## 2018-11-23 NOTE — Discharge Summary (Signed)
Physician Discharge Summary  Patient ID: [name] MRN: [id number] DOB/AGE: 25-Jul-1960 58 y.o.  Admit date: 11/22/2018 Discharge date: 11/23/2018  Admission Diagnoses: Dizziness Hypotension R/O GI bleed ESRD Type 2 DM Anemia of chronic disease Dilated cardiomyopathy  Discharge Diagnoses:  Principal Problem:   Dizziness Active Problems:   Acute dehydration   DM II (diabetes mellitus, type II), diet controlled (HCC)   Gout, chronic   End-stage renal disease on hemodialysis (HCC)   Hypotension   Anemia of chronic disease   Dilated cardiomyopathy   Moderate MR  Discharged Condition: fair  Hospital Course: 58 years old black male felt wek and dizzy after working in yard. He has PMH of ESRD, paroxysmal atrial fibrillation, cardiomyopathy, CAD, obesity and type 2 DM. He was treated with 1 litre of fluids over 24 hours with improvement in blood pressure. His echocardiogram showed mild to moderate LV systolic and diastolic dysfunction with moderate MR. His CBC, Troponin I and electrolytes were unremarkable. BUN and Creatinine were chronically elevated. Chest x-ray was unremarkable. He was discharged home with follow up by me in 1 week and by primary care in 1 month.   Consults: cardiology  Significant Diagnostic Studies: labs: Near normal CBC, Troponin-I and electrolytes. Elevated BUN/Creatinine.  EKG: Sinus tachycardia  CXR: Unremarkable.  Echocardiogram: Mild LV systolic dysfunction, Dilated LA and RA with moderate MR.  Treatments: IV hydration and holding antihypertensive medications.  Discharge Exam: Blood pressure (!) 101/47, pulse 67, temperature 97.7 F (36.5 C), temperature source Oral, resp. rate 16, height 6\' 1"  (1.854 m), weight 111.6 kg, SpO2 95 %. General appearance: alert, cooperative and appears stated age. Head: Normocephalic, atraumatic. Eyes: Brown eyes, pink conjunctiva, corneas clear. PERRL, EOM's intact.  Neck: No adenopathy, no carotid bruit, no JVD,  supple, symmetrical, trachea midline and thyroid not enlarged. Resp: Clear to auscultation bilaterally. Cardio: Regular rate and rhythm, S1, S2 normal, II/VI systolic murmur, no click, rub or gallop. GI: Soft, non-tender; bowel sounds normal; no organomegaly. Extremities: No edema, cyanosis or clubbing. Left forearm AV fistula. Skin: Warm and dry.  Neurologic: Alert and oriented X 3, normal strength and tone. Normal coordination and slow gait.  Disposition: Discharge disposition: 01-Home or Self Care        Allergies as of 11/23/2018      Reactions   Ace Inhibitors Itching, Cough      Medication List    STOP taking these medications   acetaminophen 325 MG tablet Commonly known as: TYLENOL   doxercalciferol 4 MCG/2ML injection Commonly known as: HECTOROL   isosorbide dinitrate 20 MG tablet Commonly known as: ISORDIL   mirtazapine 7.5 MG tablet Commonly known as: REMERON     TAKE these medications   allopurinol 100 MG tablet Commonly known as: ZYLOPRIM Take 1 tablet (100 mg total) by mouth daily. Start taking on: November 24, 2018   amiodarone 200 MG tablet Commonly known as: PACERONE Take 0.5 tablets (100 mg total) by mouth daily.   Auryxia 1 GM 210 MG(Fe) tablet Generic drug: ferric citrate Take 210 mg by mouth 3 (three) times daily with meals.   calcium acetate 667 MG capsule Commonly known as: PHOSLO Take 1-3 capsules (667-2,001 mg total) by mouth See admin instructions. Take 2001 mg capsules with meals three times daily and 667 mg capsule with snacks.   chlorhexidine 0.12 % solution Commonly known as: PERIDEX 15 mLs by Mouth Rinse route daily as needed (dryness).   Lidocaine (Anorectal) 5 % Crea Commonly known as: L-M-X 5  Apply 1 application topically 4 (four) times daily as needed (apply to area up to 6 times daily as needed). What changed:   when to take this  additional instructions   lidocaine-prilocaine cream Commonly known as: EMLA Apply 1  application topically See admin instructions. Apply topically to port access prior to dialysis - Monday, Wednesday, Friday   meclizine 25 MG tablet Commonly known as: ANTIVERT Take 1 tablet (25 mg total) by mouth 3 (three) times daily as needed for dizziness.   midodrine 10 MG tablet Commonly known as: PROAMATINE 30 mg  on the days of dyalisis three times a week, and only takes 10 mg on off dyalisis days. What changed:   how much to take  how to take this  when to take this  additional instructions   multivitamin Tabs tablet Take 1 tablet by mouth at bedtime.   nitroGLYCERIN 0.4 MG SL tablet Commonly known as: NITROSTAT Place 0.4 mg under the tongue every 5 (five) minutes as needed for chest pain.   oxyCODONE-acetaminophen 5-325 MG tablet Commonly known as: PERCOCET/ROXICET Take 2 tablets by mouth every 6 (six) hours as needed for severe pain.   pantoprazole 40 MG tablet Commonly known as: PROTONIX Take 40 mg by mouth 2 (two) times daily.   UNABLE TO FIND Out patient physical therapy- vestibular therapy for BPPV   warfarin 2.5 MG tablet Commonly known as: COUMADIN Take 3 tablets (7.5 mg total) by mouth daily at 6 PM. What changed:   medication strength  how much to take      Follow-up Information    Rodrig[place], Sunday Spillers, PA-C. Schedule an appointment as soon as possible for a visit in 1 month(s).   Specialties: Internal Medicine, Emergency Medicine Contact information: [address] [place] [address] [phone]        Dixie Dials, MD. Schedule an appointment as soon as possible for a visit in 1 week(s).   Specialty: Cardiology Contact information: [place] [address] [phone]           Time spent: Review of old chart, current chart, lab, x-ray, cardiac tests and discussion with patient over 60 minutes.  Signed: Birdie Riddle 11/23/2018, 3:26 PM

## 2018-11-24 DIAGNOSIS — E1129 Type 2 diabetes mellitus with other diabetic kidney complication: Secondary | ICD-10-CM | POA: Diagnosis not present

## 2018-11-24 DIAGNOSIS — N186 End stage renal disease: Secondary | ICD-10-CM | POA: Diagnosis not present

## 2018-11-24 DIAGNOSIS — D631 Anemia in chronic kidney disease: Secondary | ICD-10-CM | POA: Diagnosis not present

## 2018-11-24 DIAGNOSIS — N2581 Secondary hyperparathyroidism of renal origin: Secondary | ICD-10-CM | POA: Diagnosis not present

## 2018-11-24 DIAGNOSIS — Z992 Dependence on renal dialysis: Secondary | ICD-10-CM | POA: Diagnosis not present

## 2018-11-27 DIAGNOSIS — Z992 Dependence on renal dialysis: Secondary | ICD-10-CM | POA: Diagnosis not present

## 2018-11-27 DIAGNOSIS — N2581 Secondary hyperparathyroidism of renal origin: Secondary | ICD-10-CM | POA: Diagnosis not present

## 2018-11-27 DIAGNOSIS — E1129 Type 2 diabetes mellitus with other diabetic kidney complication: Secondary | ICD-10-CM | POA: Diagnosis not present

## 2018-11-27 DIAGNOSIS — N186 End stage renal disease: Secondary | ICD-10-CM | POA: Diagnosis not present

## 2018-11-27 DIAGNOSIS — D631 Anemia in chronic kidney disease: Secondary | ICD-10-CM | POA: Diagnosis not present

## 2018-11-28 ENCOUNTER — Encounter: Payer: Self-pay | Admitting: Internal Medicine

## 2018-11-29 DIAGNOSIS — N186 End stage renal disease: Secondary | ICD-10-CM | POA: Diagnosis not present

## 2018-11-29 DIAGNOSIS — N2581 Secondary hyperparathyroidism of renal origin: Secondary | ICD-10-CM | POA: Diagnosis not present

## 2018-11-29 DIAGNOSIS — E1129 Type 2 diabetes mellitus with other diabetic kidney complication: Secondary | ICD-10-CM | POA: Diagnosis not present

## 2018-11-29 DIAGNOSIS — D631 Anemia in chronic kidney disease: Secondary | ICD-10-CM | POA: Diagnosis not present

## 2018-11-29 DIAGNOSIS — Z992 Dependence on renal dialysis: Secondary | ICD-10-CM | POA: Diagnosis not present

## 2018-12-01 DIAGNOSIS — N2581 Secondary hyperparathyroidism of renal origin: Secondary | ICD-10-CM | POA: Diagnosis not present

## 2018-12-01 DIAGNOSIS — D689 Coagulation defect, unspecified: Secondary | ICD-10-CM | POA: Diagnosis not present

## 2018-12-01 DIAGNOSIS — Z992 Dependence on renal dialysis: Secondary | ICD-10-CM | POA: Diagnosis not present

## 2018-12-01 DIAGNOSIS — D631 Anemia in chronic kidney disease: Secondary | ICD-10-CM | POA: Diagnosis not present

## 2018-12-01 DIAGNOSIS — E1129 Type 2 diabetes mellitus with other diabetic kidney complication: Secondary | ICD-10-CM | POA: Diagnosis not present

## 2018-12-01 DIAGNOSIS — N186 End stage renal disease: Secondary | ICD-10-CM | POA: Diagnosis not present

## 2018-12-04 DIAGNOSIS — D631 Anemia in chronic kidney disease: Secondary | ICD-10-CM | POA: Diagnosis not present

## 2018-12-04 DIAGNOSIS — N186 End stage renal disease: Secondary | ICD-10-CM | POA: Diagnosis not present

## 2018-12-04 DIAGNOSIS — E1129 Type 2 diabetes mellitus with other diabetic kidney complication: Secondary | ICD-10-CM | POA: Diagnosis not present

## 2018-12-04 DIAGNOSIS — Z992 Dependence on renal dialysis: Secondary | ICD-10-CM | POA: Diagnosis not present

## 2018-12-04 DIAGNOSIS — N2581 Secondary hyperparathyroidism of renal origin: Secondary | ICD-10-CM | POA: Diagnosis not present

## 2018-12-06 DIAGNOSIS — Z992 Dependence on renal dialysis: Secondary | ICD-10-CM | POA: Diagnosis not present

## 2018-12-06 DIAGNOSIS — N2581 Secondary hyperparathyroidism of renal origin: Secondary | ICD-10-CM | POA: Diagnosis not present

## 2018-12-06 DIAGNOSIS — D689 Coagulation defect, unspecified: Secondary | ICD-10-CM | POA: Diagnosis not present

## 2018-12-06 DIAGNOSIS — N186 End stage renal disease: Secondary | ICD-10-CM | POA: Diagnosis not present

## 2018-12-06 DIAGNOSIS — E1129 Type 2 diabetes mellitus with other diabetic kidney complication: Secondary | ICD-10-CM | POA: Diagnosis not present

## 2018-12-06 DIAGNOSIS — D631 Anemia in chronic kidney disease: Secondary | ICD-10-CM | POA: Diagnosis not present

## 2018-12-07 LAB — POCT INR: INR: 1.9 — AB (ref 0.9–1.1)

## 2018-12-08 DIAGNOSIS — N186 End stage renal disease: Secondary | ICD-10-CM | POA: Diagnosis not present

## 2018-12-08 DIAGNOSIS — D631 Anemia in chronic kidney disease: Secondary | ICD-10-CM | POA: Diagnosis not present

## 2018-12-08 DIAGNOSIS — N2581 Secondary hyperparathyroidism of renal origin: Secondary | ICD-10-CM | POA: Diagnosis not present

## 2018-12-08 DIAGNOSIS — E1129 Type 2 diabetes mellitus with other diabetic kidney complication: Secondary | ICD-10-CM | POA: Diagnosis not present

## 2018-12-08 DIAGNOSIS — Z992 Dependence on renal dialysis: Secondary | ICD-10-CM | POA: Diagnosis not present

## 2018-12-11 DIAGNOSIS — N186 End stage renal disease: Secondary | ICD-10-CM | POA: Diagnosis not present

## 2018-12-11 DIAGNOSIS — Z992 Dependence on renal dialysis: Secondary | ICD-10-CM | POA: Diagnosis not present

## 2018-12-11 DIAGNOSIS — E1129 Type 2 diabetes mellitus with other diabetic kidney complication: Secondary | ICD-10-CM | POA: Diagnosis not present

## 2018-12-11 DIAGNOSIS — D631 Anemia in chronic kidney disease: Secondary | ICD-10-CM | POA: Diagnosis not present

## 2018-12-11 DIAGNOSIS — N2581 Secondary hyperparathyroidism of renal origin: Secondary | ICD-10-CM | POA: Diagnosis not present

## 2018-12-13 DIAGNOSIS — Z992 Dependence on renal dialysis: Secondary | ICD-10-CM | POA: Diagnosis not present

## 2018-12-13 DIAGNOSIS — N186 End stage renal disease: Secondary | ICD-10-CM | POA: Diagnosis not present

## 2018-12-13 DIAGNOSIS — N2581 Secondary hyperparathyroidism of renal origin: Secondary | ICD-10-CM | POA: Diagnosis not present

## 2018-12-13 DIAGNOSIS — I4892 Unspecified atrial flutter: Secondary | ICD-10-CM | POA: Diagnosis not present

## 2018-12-13 DIAGNOSIS — E1129 Type 2 diabetes mellitus with other diabetic kidney complication: Secondary | ICD-10-CM | POA: Diagnosis not present

## 2018-12-13 DIAGNOSIS — D631 Anemia in chronic kidney disease: Secondary | ICD-10-CM | POA: Diagnosis not present

## 2018-12-14 ENCOUNTER — Telehealth: Payer: Self-pay | Admitting: Internal Medicine

## 2018-12-14 ENCOUNTER — Other Ambulatory Visit: Payer: Self-pay | Admitting: Internal Medicine

## 2018-12-14 DIAGNOSIS — M13811 Other specified arthritis, right shoulder: Secondary | ICD-10-CM

## 2018-12-14 LAB — POCT INR: INR: 1.8 — AB (ref 0.9–1.1)

## 2018-12-14 LAB — CBC AND DIFFERENTIAL
HCT: 32 — AB (ref 41–53)
Hemoglobin: 10.2 — AB (ref 13.5–17.5)
WBC: 3.3

## 2018-12-14 LAB — PROTIME-INR: Protime: 20.6 — AB (ref 10.0–13.8)

## 2018-12-14 NOTE — Telephone Encounter (Signed)
Pt called about his results of shoulder xray

## 2018-12-15 ENCOUNTER — Encounter: Payer: Self-pay | Admitting: Internal Medicine

## 2018-12-15 DIAGNOSIS — N186 End stage renal disease: Secondary | ICD-10-CM | POA: Diagnosis not present

## 2018-12-15 DIAGNOSIS — E1129 Type 2 diabetes mellitus with other diabetic kidney complication: Secondary | ICD-10-CM | POA: Diagnosis not present

## 2018-12-15 DIAGNOSIS — N2581 Secondary hyperparathyroidism of renal origin: Secondary | ICD-10-CM | POA: Diagnosis not present

## 2018-12-15 DIAGNOSIS — D631 Anemia in chronic kidney disease: Secondary | ICD-10-CM | POA: Diagnosis not present

## 2018-12-15 DIAGNOSIS — Z992 Dependence on renal dialysis: Secondary | ICD-10-CM | POA: Diagnosis not present

## 2018-12-18 ENCOUNTER — Encounter: Payer: Self-pay | Admitting: Internal Medicine

## 2018-12-18 DIAGNOSIS — E1129 Type 2 diabetes mellitus with other diabetic kidney complication: Secondary | ICD-10-CM | POA: Diagnosis not present

## 2018-12-18 DIAGNOSIS — N2581 Secondary hyperparathyroidism of renal origin: Secondary | ICD-10-CM | POA: Diagnosis not present

## 2018-12-18 DIAGNOSIS — Z992 Dependence on renal dialysis: Secondary | ICD-10-CM | POA: Diagnosis not present

## 2018-12-18 DIAGNOSIS — N186 End stage renal disease: Secondary | ICD-10-CM | POA: Diagnosis not present

## 2018-12-18 DIAGNOSIS — D631 Anemia in chronic kidney disease: Secondary | ICD-10-CM | POA: Diagnosis not present

## 2018-12-19 ENCOUNTER — Encounter: Payer: Self-pay | Admitting: Physician Assistant

## 2018-12-19 ENCOUNTER — Ambulatory Visit (INDEPENDENT_AMBULATORY_CARE_PROVIDER_SITE_OTHER): Payer: Medicare Other | Admitting: Orthopaedic Surgery

## 2018-12-19 DIAGNOSIS — M25511 Pain in right shoulder: Secondary | ICD-10-CM | POA: Diagnosis not present

## 2018-12-19 MED ORDER — METHYLPREDNISOLONE ACETATE 40 MG/ML IJ SUSP
40.0000 mg | INTRAMUSCULAR | Status: AC | PRN
  Administered 2018-12-19: 40 mg via INTRA_ARTICULAR

## 2018-12-19 MED ORDER — LIDOCAINE HCL 2 % IJ SOLN
2.0000 mL | INTRAMUSCULAR | Status: AC | PRN
  Administered 2018-12-19: 2 mL

## 2018-12-19 MED ORDER — BUPIVACAINE HCL 0.25 % IJ SOLN
2.0000 mL | INTRAMUSCULAR | Status: AC | PRN
  Administered 2018-12-19: 2 mL via INTRA_ARTICULAR

## 2018-12-19 NOTE — Progress Notes (Signed)
Office Visit Note   Patient: [name]           Date of Birth: Mar 25, 1961           MRN: [id number] Visit Date: 12/19/2018              Requested by: Shelby Mattocks, PA-C [address] [place],  [place] [address] PCP: Shelby Mattocks, PA-C   Assessment & Plan: Visit Diagnoses:  1. Acute pain of right shoulder     Plan: Impression is right shoulder rotator cuff tendinitis and subacromial bursitis.  I injected this with cortisone today.  He will give this at least 2 weeks, and at that point if he has not noticed any improvement he will call us back we will obtain an MRI to further assess his rotator cuff.  Otherwise, follow-up with [place] as needed.  Follow-Up Instructions: Return if symptoms worsen or fail to improve.   Orders:  Orders Placed This Encounter  Procedures  . Large Joint Inj: R subacromial bursa   No orders of the defined types were placed in this encounter.     Procedures: Large Joint Inj: R subacromial bursa on 12/19/2018 9:42 AM Indications: pain Details: 22 G needle Medications: 2 mL bupivacaine 0.25 %; 2 mL lidocaine 2 %; 40 mg methylPREDNISolone acetate 40 MG/ML Outcome: tolerated well, no immediate complications Patient was prepped and draped in the usual sterile fashion.       Clinical Data: No additional findings.   Subjective: Chief Complaint  Patient presents with  . Right Shoulder - Pain  . Left Shoulder - Pain    HPI patient is a pleasant 58 year old gentleman who presents our clinic today with right shoulder pain.  This began over a month ago after he fell landing on an outstretched hand.  He was initially seen by another provider where x-rays of the right shoulder were obtained.  These were negative for fracture or other acute findings.  He comes in today for further evaluation treatment recommendation.  The pain he has is to the entire shoulder.  He describes it as a constant pain worse with  forward flexion.  He has not tried any medications for this.  He does note numbness and tingling to both hands but this has been ongoing for several years.  No previous shoulder pathology.  Review of Systems as detailed in HPI.  All others reviewed and are negative.   Objective: Vital Signs: There were no vitals taken for this visit.  Physical Exam well-developed and well-nourished gentleman no acute distress.  Alert and oriented x3.  Ortho Exam examination of the right shoulder reveals 80% active range of motion.  He can internally rotate to L5.  Moderate tenderness over the [facility] joint.  Positive empty can and cross body abduction.  No focal weakness.  He is neurovascularly intact distally.  Specialty Comments:  No specialty comments available.  Imaging: No new imaging   PMFS History: Patient Active Problem List   Diagnosis Date Noted  . Dizziness 11/22/2018  . Hypertensive heart and CKD, ESRD on dialysis, w CHF ([place]) 11/09/2018  . 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 Vertigo 07/31/2018  . Acute lower GI bleeding 06/21/2018  . Supratherapeutic INR 06/20/2018  . Melena 06/20/2018  . Malnutrition of moderate degree 02/10/2018  . Anemia 01/27/2018  . HCAP (healthcare-associated pneumonia) 01/26/2018  . CAD (coronary artery disease) 01/26/2018  . Atrial fibrillation, chronic 01/26/2018  . Depression 01/26/2018  . GERD (gastroesophageal reflux disease) 01/26/2018  .  Pulmonary embolism ([place]) 01/26/2018  . History of atrial flutter 01/13/2018  . Acute respiratory failure with hypoxemia ([place]) 07/17/2017  . 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  . Acute on chronic combined systolic and diastolic CHF, NYHA class 4 ([place]) 07/17/2017  . Unresponsiveness 03/16/2017  . Hypotension 03/16/2017  . Paroxysmal atrial flutter ([place]) 03/16/2017  . Chronic pain 03/16/2017  . Leg pain, bilateral 03/16/2017  . Elevated troponin 03/15/2016  . Rib pain on right side   . Atrial flutter with rapid ventricular response  ([place]) 06/27/2015  . Pulmonary edema 01/13/2015  . Right upper quadrant abdominal pain 01/13/2015  . Diverticulitis large intestine w/o perforation or abscess w/o bleeding 12/18/2014  . Hepatic cyst 12/18/2014  . Aftercare following surgery of the circulatory system, [place] 06/22/2013  . Right sided weakness 10/05/2012  . History of nephrectomy 07/04/2012  . End-stage renal disease on hemodialysis ([place]) 06/04/2011  . Chronic combined systolic (congestive) and diastolic (congestive) heart failure ([place]) 05/19/2011    Class: Acute  . Hypertension, benign 05/19/2011    Class: Chronic  . DM II (diabetes mellitus, type II), controlled ([place]) 05/19/2011    Class: Chronic  . Gout, chronic 05/19/2011   Past Medical History:  Diagnosis Date  . Acute respiratory failure with hypoxia ([place]) 01/13/2015  . Atrial flutter ([place])   . Bile leak, postoperative 03/15/2016  . Biliary dyskinesia 02/12/2016  . 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 Cardiomyopathy   . CHF (congestive heart failure) ([place])   . Coronary artery disease    normal coronaries by 10/10/08 cath, Cardiac Cath 08-04-12 epic.Dr. Doylene Canard follows  . Diabetes mellitus    pt. states he's borderline diabetic., no longer taking med,- off med. since 2013  . Dialysis patient [facility])    Mon-Wed-Fri([place])- Left AV fistula  . Diverticulitis November 2016   reoccurred in December 2016  . Diverticulitis of intestine without perforation or abscess without bleeding 04/21/2015  . ESRD (end stage renal disease) ([place])   . GERD (gastroesophageal reflux disease)   . Gout   . Hemorrhage of left kidney 01/13/2018  . History of nephrectomy 07/04/2012   History of right nephrectomy in 2002 for renal cell carcinoma   . History of unilateral nephrectomy   . Hypertension   . Low iron   . Myocardial infarction [facility]) ?2006  . Renal cell carcinoma    dialysis- MWF- Dr. Mercy Moore follows.  . Renal insufficiency   . Shortness  of breath 05/19/11   "at rest, lying down, w/exertion"  . Stroke [facility]) 02/2011   05/19/11 denies residual  . Umbilical hernia 76/28/31   unrepaired    Family History  Problem Relation Age of Onset  . Hypertension Mother   . Kidney disease Mother     Past Surgical History:  Procedure Laterality Date  . ANAL FISTULOTOMY  08/15/2018  . AV FISTULA PLACEMENT  03/29/2011   Procedure: ARTERIOVENOUS (AV) FISTULA CREATION;  Surgeon: Hinda Lenis, MD;  Location: [place];  Service: Vascular;  Laterality: Left;  LEFT RADIOCEPHALIC , Arteriovenous [id number](73710)  . CARDIAC CATHETERIZATION  05/21/11  . CARDIAC CATHETERIZATION N/A 03/16/2016   Procedure: Left Heart Cath and Coronary Angiography;  Surgeon: Dixie Dials, MD;  Location: [place] CV LAB;  Service: Cardiovascular;  Laterality: N/A;  . CHOLECYSTECTOMY N/A 02/12/2016   Procedure: LAPAROSCOPIC CHOLECYSTECTOMY WITH INTRAOPERATIVE CHOLANGIOGRAM;  Surgeon: Jackolyn Confer, MD;  Location: [place];  Service: General;  Laterality: N/A;  . COLONOSCOPY  WITH PROPOFOL N/A 02/01/2017   Procedure: COLONOSCOPY WITH PROPOFOL;  Surgeon: Juanita Craver, MD;  Location: [facility];  Service: Endoscopy;  Laterality: N/A;  . dialysis cath placed    . ESOPHAGOGASTRODUODENOSCOPY (EGD) WITH PROPOFOL N/A 12/02/2015   Procedure: ESOPHAGOGASTRODUODENOSCOPY (EGD) WITH PROPOFOL;  Surgeon: Juanita Craver, MD;  Location: [facility];  Service: Endoscopy;  Laterality: N/A;  . EVALUATION UNDER ANESTHESIA WITH HEMORRHOIDECTOMY N/A 08/15/2018   Procedure: HEMORRHOID LIGATION/PEXY;  Surgeon: Michael Boston, MD;  Location: [place];  Service: General;  Laterality: N/A;  . FINGER SURGERY     L pinkie finger- ORIF- /w remaining hardware - 1990's    . FISTULOTOMY N/A 08/15/2018   Procedure: SUPERFICIAL ANAL FISTULOTOMY;  Surgeon: Michael Boston, MD;  Location: [place];  Service: General;  Laterality: N/A;  . HEMORRHOID SURGERY    . HERNIA REPAIR N/A 3/49/1791   Umbilical hernia repair   . INSERTION OF DIALYSIS CATHETER N/A 01/27/2016   Procedure: INSERTION OF DIALYSIS CATHETER;  Surgeon: Waynetta Sandy, MD;  Location: [place];  Service: Vascular;  Laterality: N/A;  . INSERTION OF MESH N/A 07/04/2012   Procedure: INSERTION OF MESH;  Surgeon: Madilyn Hook, DO;  Location: [place];  Service: General;  Laterality: N/A;  . IR EMBO ART  VEN HEMORR LYMPH EXTRAV  INC GUIDE ROADMAPPING  01/13/2018  . IR FLUORO GUIDE CV LINE RIGHT  01/13/2018  . IR IVC FILTER PLMT / S&I /IMG GUID/MOD SED  01/27/2018  . IR RENAL SELECTIVE  UNI INC S&I MOD SED  01/13/2018  . IR US GUIDE VASC ACCESS RIGHT  01/13/2018  . KIDNEY CYST REMOVAL  2019  . LAPAROSCOPIC LYSIS OF ADHESIONS N/A 02/12/2016   Procedure: LAPAROSCOPIC LYSIS OF ADHESIONS;  Surgeon: Jackolyn Confer, MD;  Location: [place];  Service: General;  Laterality: N/A;  . LEFT AND RIGHT HEART CATHETERIZATION WITH CORONARY ANGIOGRAM N/A 08/04/2012   Procedure: LEFT AND RIGHT HEART CATHETERIZATION WITH CORONARY ANGIOGRAM;  Surgeon: Birdie Riddle, MD;  Location: [place] CATH LAB;  Service: Cardiovascular;  Laterality: N/A;  . NEPHRECTOMY  2000   right  . REVISON OF ARTERIOVENOUS FISTULA Left 06/05/2013   Procedure: REVISON OF LEFT RADIOCEPHALIC  ARTERIOVENOUS FISTULA;  Surgeon: Conrad Decatur, MD;  Location: [place];  Service: Vascular;  Laterality: Left;  . REVISON OF ARTERIOVENOUS FISTULA Left 01/27/2016   Procedure: REVISION OF LEFT UPPER EXTREMITY ARTERIOVENOUS FISTULA;  Surgeon: Waynetta Sandy, MD;  Location: [place];  Service: Vascular;  Laterality: Left;  . REVISON OF ARTERIOVENOUS FISTULA Left 08/03/2016   Procedure: REVISON OF Left arm ARTERIOVENOUS FISTULA;  Surgeon: Elam Dutch, MD;  Location: [place];  Service: Vascular;  Laterality: Left;  . REVISON OF ARTERIOVENOUS FISTULA Left 05/06/2017   Procedure: REVISION PLICATION OF ARTERIOVENOUS FISTULA LEFT ARM;  Surgeon: Rosetta Posner, MD;  Location: [place];  Service: Vascular;  Laterality: Left;  .  RIGHT HEART CATHETERIZATION N/A 05/21/2011   Procedure: RIGHT HEART CATH;  Surgeon: Birdie Riddle, MD;  Location: [facility] CATH LAB;  Service: Cardiovascular;  Laterality: N/A;  . smashed  1990's   "left pinky; have a plate in there"  . UMBILICAL HERNIA REPAIR N/A 07/04/2012   Procedure: LAPAROSCOPIC UMBILICAL HERNIA;  Surgeon: Madilyn Hook, DO;  Location: [place];  Service: General;  Laterality: N/A;  . US ECHOCARDIOGRAPHY  05/20/11   Social History   Occupational History  . Occupation: disability  Tobacco Use  . Smoking status: Never Smoker  . Smokeless tobacco: Never  Used  Substance and Sexual Activity  . Alcohol use: No    Alcohol/week: 0.0 standard drinks  . Drug use: No    Types: Cocaine    Comment: Past hx-none in 20 yrs  . Sexual activity: Yes

## 2018-12-20 DIAGNOSIS — Z992 Dependence on renal dialysis: Secondary | ICD-10-CM | POA: Diagnosis not present

## 2018-12-20 DIAGNOSIS — N186 End stage renal disease: Secondary | ICD-10-CM | POA: Diagnosis not present

## 2018-12-20 DIAGNOSIS — D631 Anemia in chronic kidney disease: Secondary | ICD-10-CM | POA: Diagnosis not present

## 2018-12-20 DIAGNOSIS — N2581 Secondary hyperparathyroidism of renal origin: Secondary | ICD-10-CM | POA: Diagnosis not present

## 2018-12-20 DIAGNOSIS — D689 Coagulation defect, unspecified: Secondary | ICD-10-CM | POA: Diagnosis not present

## 2018-12-20 DIAGNOSIS — E1129 Type 2 diabetes mellitus with other diabetic kidney complication: Secondary | ICD-10-CM | POA: Diagnosis not present

## 2018-12-21 LAB — PROTIME-INR: INR: 2 — AB (ref 0.9–1.1)

## 2018-12-22 DIAGNOSIS — Z992 Dependence on renal dialysis: Secondary | ICD-10-CM | POA: Diagnosis not present

## 2018-12-22 DIAGNOSIS — N2581 Secondary hyperparathyroidism of renal origin: Secondary | ICD-10-CM | POA: Diagnosis not present

## 2018-12-22 DIAGNOSIS — E1129 Type 2 diabetes mellitus with other diabetic kidney complication: Secondary | ICD-10-CM | POA: Diagnosis not present

## 2018-12-22 DIAGNOSIS — D631 Anemia in chronic kidney disease: Secondary | ICD-10-CM | POA: Diagnosis not present

## 2018-12-22 DIAGNOSIS — N186 End stage renal disease: Secondary | ICD-10-CM | POA: Diagnosis not present

## 2018-12-22 LAB — ECHOCARDIOGRAM LIMITED
Height: 73 in
Weight: 3870.4 oz

## 2018-12-23 DIAGNOSIS — Z992 Dependence on renal dialysis: Secondary | ICD-10-CM | POA: Diagnosis not present

## 2018-12-23 DIAGNOSIS — N186 End stage renal disease: Secondary | ICD-10-CM | POA: Diagnosis not present

## 2018-12-23 DIAGNOSIS — I120 Hypertensive chronic kidney disease with stage 5 chronic kidney disease or end stage renal disease: Secondary | ICD-10-CM | POA: Diagnosis not present

## 2018-12-25 DIAGNOSIS — Z992 Dependence on renal dialysis: Secondary | ICD-10-CM | POA: Diagnosis not present

## 2018-12-25 DIAGNOSIS — N2581 Secondary hyperparathyroidism of renal origin: Secondary | ICD-10-CM | POA: Diagnosis not present

## 2018-12-25 DIAGNOSIS — N186 End stage renal disease: Secondary | ICD-10-CM | POA: Diagnosis not present

## 2018-12-25 DIAGNOSIS — D631 Anemia in chronic kidney disease: Secondary | ICD-10-CM | POA: Diagnosis not present

## 2018-12-27 DIAGNOSIS — Z992 Dependence on renal dialysis: Secondary | ICD-10-CM | POA: Diagnosis not present

## 2018-12-27 DIAGNOSIS — N2581 Secondary hyperparathyroidism of renal origin: Secondary | ICD-10-CM | POA: Diagnosis not present

## 2018-12-27 DIAGNOSIS — D689 Coagulation defect, unspecified: Secondary | ICD-10-CM | POA: Diagnosis not present

## 2018-12-27 DIAGNOSIS — D631 Anemia in chronic kidney disease: Secondary | ICD-10-CM | POA: Diagnosis not present

## 2018-12-27 DIAGNOSIS — N186 End stage renal disease: Secondary | ICD-10-CM | POA: Diagnosis not present

## 2018-12-28 LAB — PROTIME-INR: INR: 2.9 — AB (ref 0.9–1.1)

## 2018-12-29 DIAGNOSIS — D631 Anemia in chronic kidney disease: Secondary | ICD-10-CM | POA: Diagnosis not present

## 2018-12-29 DIAGNOSIS — N186 End stage renal disease: Secondary | ICD-10-CM | POA: Diagnosis not present

## 2018-12-29 DIAGNOSIS — Z992 Dependence on renal dialysis: Secondary | ICD-10-CM | POA: Diagnosis not present

## 2018-12-29 DIAGNOSIS — N2581 Secondary hyperparathyroidism of renal origin: Secondary | ICD-10-CM | POA: Diagnosis not present

## 2019-01-01 DIAGNOSIS — Z992 Dependence on renal dialysis: Secondary | ICD-10-CM | POA: Diagnosis not present

## 2019-01-01 DIAGNOSIS — D631 Anemia in chronic kidney disease: Secondary | ICD-10-CM | POA: Diagnosis not present

## 2019-01-01 DIAGNOSIS — N2581 Secondary hyperparathyroidism of renal origin: Secondary | ICD-10-CM | POA: Diagnosis not present

## 2019-01-01 DIAGNOSIS — N186 End stage renal disease: Secondary | ICD-10-CM | POA: Diagnosis not present

## 2019-01-03 DIAGNOSIS — N2581 Secondary hyperparathyroidism of renal origin: Secondary | ICD-10-CM | POA: Diagnosis not present

## 2019-01-03 DIAGNOSIS — D689 Coagulation defect, unspecified: Secondary | ICD-10-CM | POA: Diagnosis not present

## 2019-01-03 DIAGNOSIS — Z992 Dependence on renal dialysis: Secondary | ICD-10-CM | POA: Diagnosis not present

## 2019-01-03 DIAGNOSIS — D631 Anemia in chronic kidney disease: Secondary | ICD-10-CM | POA: Diagnosis not present

## 2019-01-03 DIAGNOSIS — N186 End stage renal disease: Secondary | ICD-10-CM | POA: Diagnosis not present

## 2019-01-05 DIAGNOSIS — N2581 Secondary hyperparathyroidism of renal origin: Secondary | ICD-10-CM | POA: Diagnosis not present

## 2019-01-05 DIAGNOSIS — Z992 Dependence on renal dialysis: Secondary | ICD-10-CM | POA: Diagnosis not present

## 2019-01-05 DIAGNOSIS — D631 Anemia in chronic kidney disease: Secondary | ICD-10-CM | POA: Diagnosis not present

## 2019-01-05 DIAGNOSIS — N186 End stage renal disease: Secondary | ICD-10-CM | POA: Diagnosis not present

## 2019-01-08 DIAGNOSIS — Z992 Dependence on renal dialysis: Secondary | ICD-10-CM | POA: Diagnosis not present

## 2019-01-08 DIAGNOSIS — N2581 Secondary hyperparathyroidism of renal origin: Secondary | ICD-10-CM | POA: Diagnosis not present

## 2019-01-08 DIAGNOSIS — D631 Anemia in chronic kidney disease: Secondary | ICD-10-CM | POA: Diagnosis not present

## 2019-01-08 DIAGNOSIS — N186 End stage renal disease: Secondary | ICD-10-CM | POA: Diagnosis not present

## 2019-01-10 DIAGNOSIS — N2581 Secondary hyperparathyroidism of renal origin: Secondary | ICD-10-CM | POA: Diagnosis not present

## 2019-01-10 DIAGNOSIS — D689 Coagulation defect, unspecified: Secondary | ICD-10-CM | POA: Diagnosis not present

## 2019-01-10 DIAGNOSIS — D631 Anemia in chronic kidney disease: Secondary | ICD-10-CM | POA: Diagnosis not present

## 2019-01-10 DIAGNOSIS — N186 End stage renal disease: Secondary | ICD-10-CM | POA: Diagnosis not present

## 2019-01-10 DIAGNOSIS — Z992 Dependence on renal dialysis: Secondary | ICD-10-CM | POA: Diagnosis not present

## 2019-01-12 DIAGNOSIS — N186 End stage renal disease: Secondary | ICD-10-CM | POA: Diagnosis not present

## 2019-01-12 DIAGNOSIS — D631 Anemia in chronic kidney disease: Secondary | ICD-10-CM | POA: Diagnosis not present

## 2019-01-12 DIAGNOSIS — N2581 Secondary hyperparathyroidism of renal origin: Secondary | ICD-10-CM | POA: Diagnosis not present

## 2019-01-12 DIAGNOSIS — Z992 Dependence on renal dialysis: Secondary | ICD-10-CM | POA: Diagnosis not present

## 2019-01-15 ENCOUNTER — Encounter: Payer: Self-pay | Admitting: Internal Medicine

## 2019-01-15 DIAGNOSIS — N186 End stage renal disease: Secondary | ICD-10-CM | POA: Diagnosis not present

## 2019-01-15 DIAGNOSIS — D631 Anemia in chronic kidney disease: Secondary | ICD-10-CM | POA: Diagnosis not present

## 2019-01-15 DIAGNOSIS — Z992 Dependence on renal dialysis: Secondary | ICD-10-CM | POA: Diagnosis not present

## 2019-01-15 DIAGNOSIS — N2581 Secondary hyperparathyroidism of renal origin: Secondary | ICD-10-CM | POA: Diagnosis not present

## 2019-01-17 DIAGNOSIS — N2581 Secondary hyperparathyroidism of renal origin: Secondary | ICD-10-CM | POA: Diagnosis not present

## 2019-01-17 DIAGNOSIS — N186 End stage renal disease: Secondary | ICD-10-CM | POA: Diagnosis not present

## 2019-01-17 DIAGNOSIS — D631 Anemia in chronic kidney disease: Secondary | ICD-10-CM | POA: Diagnosis not present

## 2019-01-17 DIAGNOSIS — D689 Coagulation defect, unspecified: Secondary | ICD-10-CM | POA: Diagnosis not present

## 2019-01-17 DIAGNOSIS — Z992 Dependence on renal dialysis: Secondary | ICD-10-CM | POA: Diagnosis not present

## 2019-01-18 ENCOUNTER — Other Ambulatory Visit: Payer: Self-pay | Admitting: Internal Medicine

## 2019-01-18 ENCOUNTER — Encounter (HOSPITAL_COMMUNITY): Payer: Self-pay | Admitting: *Deleted

## 2019-01-18 ENCOUNTER — Other Ambulatory Visit: Payer: Self-pay

## 2019-01-18 ENCOUNTER — Emergency Department (HOSPITAL_COMMUNITY): Payer: Medicare Other

## 2019-01-18 ENCOUNTER — Ambulatory Visit (INDEPENDENT_AMBULATORY_CARE_PROVIDER_SITE_OTHER): Payer: Medicare Other | Admitting: Internal Medicine

## 2019-01-18 ENCOUNTER — Observation Stay (HOSPITAL_COMMUNITY)
Admission: EM | Admit: 2019-01-18 | Discharge: 2019-01-19 | Disposition: A | Discharge status: Home / Self Care | Payer: Medicare Other | Attending: Family Medicine | Admitting: Family Medicine

## 2019-01-18 VITALS — HR 66 | Temp 98.1°F | Ht 73.0 in | Wt 244.0 lb

## 2019-01-18 DIAGNOSIS — E1122 Type 2 diabetes mellitus with diabetic chronic kidney disease: Secondary | ICD-10-CM | POA: Diagnosis not present

## 2019-01-18 DIAGNOSIS — J986 Disorders of diaphragm: Secondary | ICD-10-CM | POA: Diagnosis not present

## 2019-01-18 DIAGNOSIS — Z992 Dependence on renal dialysis: Secondary | ICD-10-CM | POA: Insufficient documentation

## 2019-01-18 DIAGNOSIS — I5043 Acute on chronic combined systolic (congestive) and diastolic (congestive) heart failure: Secondary | ICD-10-CM

## 2019-01-18 DIAGNOSIS — M109 Gout, unspecified: Secondary | ICD-10-CM | POA: Diagnosis not present

## 2019-01-18 DIAGNOSIS — R197 Diarrhea, unspecified: Secondary | ICD-10-CM

## 2019-01-18 DIAGNOSIS — H5440 Blindness, one eye, unspecified eye: Secondary | ICD-10-CM | POA: Diagnosis present

## 2019-01-18 DIAGNOSIS — K5792 Diverticulitis of intestine, part unspecified, without perforation or abscess without bleeding: Secondary | ICD-10-CM | POA: Insufficient documentation

## 2019-01-18 DIAGNOSIS — I5022 Chronic systolic (congestive) heart failure: Secondary | ICD-10-CM | POA: Diagnosis not present

## 2019-01-18 DIAGNOSIS — I2510 Atherosclerotic heart disease of native coronary artery without angina pectoris: Secondary | ICD-10-CM | POA: Insufficient documentation

## 2019-01-18 DIAGNOSIS — E440 Moderate protein-calorie malnutrition: Secondary | ICD-10-CM | POA: Diagnosis not present

## 2019-01-18 DIAGNOSIS — I4892 Unspecified atrial flutter: Secondary | ICD-10-CM | POA: Diagnosis not present

## 2019-01-18 DIAGNOSIS — Z905 Acquired absence of kidney: Secondary | ICD-10-CM | POA: Insufficient documentation

## 2019-01-18 DIAGNOSIS — I953 Hypotension of hemodialysis: Secondary | ICD-10-CM | POA: Diagnosis not present

## 2019-01-18 DIAGNOSIS — E860 Dehydration: Secondary | ICD-10-CM | POA: Diagnosis not present

## 2019-01-18 DIAGNOSIS — I252 Old myocardial infarction: Secondary | ICD-10-CM | POA: Insufficient documentation

## 2019-01-18 DIAGNOSIS — I509 Heart failure, unspecified: Secondary | ICD-10-CM

## 2019-01-18 DIAGNOSIS — Z8673 Personal history of transient ischemic attack (TIA), and cerebral infarction without residual deficits: Secondary | ICD-10-CM | POA: Insufficient documentation

## 2019-01-18 DIAGNOSIS — R42 Dizziness and giddiness: Secondary | ICD-10-CM | POA: Diagnosis not present

## 2019-01-18 DIAGNOSIS — E119 Type 2 diabetes mellitus without complications: Secondary | ICD-10-CM

## 2019-01-18 DIAGNOSIS — R7989 Other specified abnormal findings of blood chemistry: Secondary | ICD-10-CM | POA: Diagnosis not present

## 2019-01-18 DIAGNOSIS — Z85528 Personal history of other malignant neoplasm of kidney: Secondary | ICD-10-CM | POA: Insufficient documentation

## 2019-01-18 DIAGNOSIS — I132 Hypertensive heart and chronic kidney disease with heart failure and with stage 5 chronic kidney disease, or end stage renal disease: Secondary | ICD-10-CM

## 2019-01-18 DIAGNOSIS — T82898A Other specified complication of vascular prosthetic devices, implants and grafts, initial encounter: Secondary | ICD-10-CM | POA: Diagnosis not present

## 2019-01-18 DIAGNOSIS — I959 Hypotension, unspecified: Secondary | ICD-10-CM | POA: Diagnosis not present

## 2019-01-18 DIAGNOSIS — I9589 Other hypotension: Secondary | ICD-10-CM | POA: Diagnosis not present

## 2019-01-18 DIAGNOSIS — I5042 Chronic combined systolic (congestive) and diastolic (congestive) heart failure: Secondary | ICD-10-CM | POA: Diagnosis present

## 2019-01-18 DIAGNOSIS — K219 Gastro-esophageal reflux disease without esophagitis: Secondary | ICD-10-CM | POA: Diagnosis present

## 2019-01-18 DIAGNOSIS — I482 Chronic atrial fibrillation, unspecified: Secondary | ICD-10-CM | POA: Insufficient documentation

## 2019-01-18 DIAGNOSIS — Z79899 Other long term (current) drug therapy: Secondary | ICD-10-CM | POA: Diagnosis not present

## 2019-01-18 DIAGNOSIS — R1084 Generalized abdominal pain: Secondary | ICD-10-CM | POA: Diagnosis not present

## 2019-01-18 DIAGNOSIS — Z7901 Long term (current) use of anticoagulants: Secondary | ICD-10-CM | POA: Insufficient documentation

## 2019-01-18 DIAGNOSIS — I429 Cardiomyopathy, unspecified: Secondary | ICD-10-CM | POA: Diagnosis not present

## 2019-01-18 DIAGNOSIS — Z86711 Personal history of pulmonary embolism: Secondary | ICD-10-CM | POA: Insufficient documentation

## 2019-01-18 DIAGNOSIS — Z20828 Contact with and (suspected) exposure to other viral communicable diseases: Secondary | ICD-10-CM | POA: Diagnosis not present

## 2019-01-18 DIAGNOSIS — R0602 Shortness of breath: Secondary | ICD-10-CM | POA: Diagnosis not present

## 2019-01-18 DIAGNOSIS — N186 End stage renal disease: Secondary | ICD-10-CM | POA: Diagnosis not present

## 2019-01-18 DIAGNOSIS — R531 Weakness: Secondary | ICD-10-CM

## 2019-01-18 DIAGNOSIS — R9431 Abnormal electrocardiogram [ECG] [EKG]: Secondary | ICD-10-CM | POA: Diagnosis not present

## 2019-01-18 DIAGNOSIS — G8929 Other chronic pain: Secondary | ICD-10-CM | POA: Diagnosis not present

## 2019-01-18 DIAGNOSIS — H5461 Unqualified visual loss, right eye, normal vision left eye: Secondary | ICD-10-CM | POA: Diagnosis not present

## 2019-01-18 DIAGNOSIS — I517 Cardiomegaly: Secondary | ICD-10-CM | POA: Diagnosis not present

## 2019-01-18 DIAGNOSIS — Z209 Contact with and (suspected) exposure to unspecified communicable disease: Secondary | ICD-10-CM | POA: Diagnosis not present

## 2019-01-18 DIAGNOSIS — M1A9XX0 Chronic gout, unspecified, without tophus (tophi): Secondary | ICD-10-CM | POA: Diagnosis present

## 2019-01-18 DIAGNOSIS — R740 Nonspecific elevation of levels of transaminase and lactic acid dehydrogenase [LDH]: Secondary | ICD-10-CM | POA: Diagnosis not present

## 2019-01-18 LAB — BASIC METABOLIC PANEL
Anion gap: 17 — ABNORMAL HIGH (ref 5–15)
BUN: 34 mg/dL — ABNORMAL HIGH (ref 6–20)
CO2: 26 mmol/L (ref 22–32)
Calcium: 9.1 mg/dL (ref 8.9–10.3)
Chloride: 94 mmol/L — ABNORMAL LOW (ref 98–111)
Creatinine, Ser: 10.12 mg/dL — ABNORMAL HIGH (ref 0.61–1.24)
GFR calc Af Amer: 6 mL/min — ABNORMAL LOW (ref >60)
GFR calc non Af Amer: 5 mL/min — ABNORMAL LOW (ref >60)
Glucose, Bld: 84 mg/dL (ref 70–99)
Potassium: 4 mmol/L (ref 3.5–5.1)
Sodium: 137 mmol/L (ref 135–145)

## 2019-01-18 LAB — CBC WITH DIFFERENTIAL/PLATELET
Abs Immature Granulocytes: 0.07 10*3/uL (ref 0.00–0.07)
Basophils Absolute: 0 10*3/uL (ref 0.0–0.1)
Basophils Relative: 1 %
Eosinophils Absolute: 0.3 10*3/uL (ref 0.0–0.5)
Eosinophils Relative: 4 %
HCT: 35 % — ABNORMAL LOW (ref 39.0–52.0)
Hemoglobin: 11.2 g/dL — ABNORMAL LOW (ref 13.0–17.0)
Immature Granulocytes: 1 %
Lymphocytes Relative: 19 %
Lymphs Abs: 1.2 10*3/uL (ref 0.7–4.0)
MCH: 32 pg (ref 26.0–34.0)
MCHC: 32 g/dL (ref 30.0–36.0)
MCV: 100 fL (ref 80.0–100.0)
Monocytes Absolute: 0.7 10*3/uL (ref 0.1–1.0)
Monocytes Relative: 11 %
Neutro Abs: 4.2 10*3/uL (ref 1.7–7.7)
Neutrophils Relative %: 64 %
Platelets: 228 10*3/uL (ref 150–400)
RBC: 3.5 MIL/uL — ABNORMAL LOW (ref 4.22–5.81)
RDW: 16.8 % — ABNORMAL HIGH (ref 11.5–15.5)
WBC: 6.5 10*3/uL (ref 4.0–10.5)
nRBC: 0.5 % — ABNORMAL HIGH (ref 0.0–0.2)

## 2019-01-18 LAB — SARS CORONAVIRUS 2 BY RT PCR (HOSPITAL ORDER, PERFORMED IN ~~LOC~~ HOSPITAL LAB): SARS Coronavirus 2: NEGATIVE

## 2019-01-18 LAB — PROTIME-INR
INR: 1.8 — AB (ref 0.9–1.1)
INR: 2.4 — ABNORMAL HIGH (ref 0.8–1.2)
Prothrombin Time: 25.8 seconds — ABNORMAL HIGH (ref 11.4–15.2)

## 2019-01-18 LAB — GLUCOSE, CAPILLARY: Glucose-Capillary: 98 mg/dL (ref 70–99)

## 2019-01-18 LAB — PROCALCITONIN: Procalcitonin: 1.04 ng/mL

## 2019-01-18 LAB — LACTIC ACID, PLASMA
Lactic Acid, Venous: 2 mmol/L (ref 0.5–1.9)
Lactic Acid, Venous: 2.5 mmol/L (ref 0.5–1.9)

## 2019-01-18 MED ORDER — OXYCODONE HCL 5 MG PO TABS: 5.0000 mg | ORAL_TABLET | ORAL | Status: DC | PRN

## 2019-01-18 MED ORDER — CALCIUM ACETATE (PHOS BINDER) 667 MG PO CAPS: 667.0000 mg | ORAL_CAPSULE | ORAL | Status: DC

## 2019-01-18 MED ORDER — CHLORHEXIDINE GLUCONATE 0.12 % MT SOLN
15.0000 mL | Freq: Every day | OROMUCOSAL | Status: DC | PRN
  Filled 2019-01-18: qty 15

## 2019-01-18 MED ORDER — ACETAMINOPHEN 325 MG PO TABS: 650.0000 mg | ORAL_TABLET | Freq: Four times a day (QID) | ORAL | Status: DC | PRN

## 2019-01-18 MED ORDER — ONDANSETRON HCL 4 MG/2ML IJ SOLN: 4.0000 mg | Freq: Four times a day (QID) | INTRAMUSCULAR | Status: DC | PRN

## 2019-01-18 MED ORDER — LIDOCAINE-PRILOCAINE 2.5-2.5 % EX CREA: 1.0000 "application " | TOPICAL_CREAM | CUTANEOUS | Status: DC

## 2019-01-18 MED ORDER — LIDOCAINE (ANORECTAL) 5 % EX CREA: 1.0000 "application " | TOPICAL_CREAM | CUTANEOUS | Status: DC

## 2019-01-18 MED ORDER — HEPARIN SODIUM (PORCINE) 5000 UNIT/ML IJ SOLN
5000.0000 [IU] | Freq: Three times a day (TID) | INTRAMUSCULAR | Status: DC
  Administered 2019-01-18 – 2019-01-19 (×2): 5000 [IU] via SUBCUTANEOUS
  Filled 2019-01-18 (×2): qty 1

## 2019-01-18 MED ORDER — FERRIC CITRATE 1 GM 210 MG(FE) PO TABS
210.0000 mg | ORAL_TABLET | Freq: Three times a day (TID) | ORAL | Status: DC
  Administered 2019-01-19: 210 mg via ORAL
  Filled 2019-01-18: qty 1

## 2019-01-18 MED ORDER — RENA-VITE PO TABS
1.0000 | ORAL_TABLET | Freq: Every day | ORAL | Status: DC
  Administered 2019-01-18: 1 via ORAL
  Filled 2019-01-18: qty 1

## 2019-01-18 MED ORDER — TRAZODONE HCL 50 MG PO TABS
50.0000 mg | ORAL_TABLET | Freq: Every evening | ORAL | Status: DC | PRN
  Administered 2019-01-18: 50 mg via ORAL
  Filled 2019-01-18: qty 1

## 2019-01-18 MED ORDER — MECLIZINE HCL 25 MG PO TABS: 25.0000 mg | ORAL_TABLET | Freq: Three times a day (TID) | ORAL | Status: DC | PRN

## 2019-01-18 MED ORDER — AMIODARONE HCL 100 MG PO TABS
100.0000 mg | ORAL_TABLET | Freq: Every day | ORAL | Status: DC
  Administered 2019-01-19: 100 mg via ORAL
  Filled 2019-01-18: qty 1

## 2019-01-18 MED ORDER — ONDANSETRON HCL 4 MG PO TABS: 4.0000 mg | ORAL_TABLET | Freq: Four times a day (QID) | ORAL | Status: DC | PRN

## 2019-01-18 MED ORDER — ALLOPURINOL 100 MG PO TABS
100.0000 mg | ORAL_TABLET | Freq: Every day | ORAL | Status: DC
  Administered 2019-01-19: 100 mg via ORAL
  Filled 2019-01-18: qty 1

## 2019-01-18 MED ORDER — SODIUM CHLORIDE 0.9% FLUSH
3.0000 mL | Freq: Two times a day (BID) | INTRAVENOUS | Status: DC
  Administered 2019-01-18 – 2019-01-19 (×2): 3 mL via INTRAVENOUS

## 2019-01-18 MED ORDER — PANTOPRAZOLE SODIUM 40 MG PO TBEC
40.0000 mg | DELAYED_RELEASE_TABLET | Freq: Two times a day (BID) | ORAL | Status: DC
  Administered 2019-01-18 – 2019-01-19 (×2): 40 mg via ORAL
  Filled 2019-01-18 (×2): qty 1

## 2019-01-18 MED ORDER — CHLORHEXIDINE GLUCONATE CLOTH 2 % EX PADS
6.0000 | MEDICATED_PAD | Freq: Every day | CUTANEOUS | Status: DC
  Administered 2019-01-19: 6 via TOPICAL

## 2019-01-18 MED ORDER — SODIUM CHLORIDE 0.9 % IV BOLUS
500.0000 mL | Freq: Once | INTRAVENOUS | Status: AC
  Administered 2019-01-18: 500 mL via INTRAVENOUS

## 2019-01-18 MED ORDER — POLYETHYLENE GLYCOL 3350 17 G PO PACK: 17.0000 g | PACK | Freq: Every day | ORAL | Status: DC | PRN

## 2019-01-18 MED ORDER — DOXERCALCIFEROL 4 MCG/2ML IV SOLN
6.0000 ug | INTRAVENOUS | Status: DC
  Administered 2019-01-19: 6 ug via INTRAVENOUS
  Filled 2019-01-18: qty 4

## 2019-01-18 MED ORDER — MIDODRINE HCL 5 MG PO TABS
10.0000 mg | ORAL_TABLET | Freq: Three times a day (TID) | ORAL | Status: DC
  Administered 2019-01-18 – 2019-01-19 (×3): 10 mg via ORAL
  Filled 2019-01-18 (×2): qty 2

## 2019-01-18 MED ORDER — ACETAMINOPHEN 650 MG RE SUPP: 650.0000 mg | Freq: Four times a day (QID) | RECTAL | Status: DC | PRN

## 2019-01-18 MED ORDER — SODIUM CHLORIDE 0.9 % IV BOLUS
500.0000 mL | Freq: Once | INTRAVENOUS | Status: AC
  Administered 2019-01-18: 16:00:00 500 mL via INTRAVENOUS

## 2019-01-18 MED ORDER — CALCIUM ACETATE (PHOS BINDER) 667 MG PO CAPS
2001.0000 mg | ORAL_CAPSULE | Freq: Three times a day (TID) | ORAL | Status: DC
  Administered 2019-01-19: 2001 mg via ORAL
  Filled 2019-01-18: qty 3

## 2019-01-18 NOTE — ED Provider Notes (Signed)
[facility] EMERGENCY DEPARTMENT Provider Note   CSN: [id number] Arrival date & time: 01/18/19  1205     History   Chief Complaint Chief Complaint  Patient presents with  . Near Syncope    HPI [name] is a 58 y.o. male with history of atrial flutter, CHF, CAD, diabetes mellitus, ESRD on dialysis Monday Wednesday Friday, GERD, hypertension presenting for evaluation of acute onset, persistent generalized weakness, lightheadedness, and hypotension for 3 days.  He reports that baseline blood pressure is around 01-601 systolic over 09-32 diastolic.  He underwent dialysis yesterday but cut the treatment short by 30 minutes due to low blood pressures.  Reports that for the last 3 days every time he moves or attempts to ambulate he becomes lightheaded and feels as though he will lose consciousness.  This resolves with rest.  Also notes some dyspnea on exertion, no shortness of breath at rest.  He denies fever, headache, cough, chest pain, numbness or tingling of the extremities, abdominal pain.  He had one episode of nonbloody nonbilious emesis yesterday after eating a sloppy Joe sandwich; his wife had the same meal and did not have any nausea or vomiting.  He has had multiple episodes of nonbloody watery stools daily for the last 4 days. He has not tried anything for his symptoms.  He went to his PCP today who sent him to the ED for further evaluation of his hypotension and lightheadedness.  He does not make urine anymore.     The history is provided by the patient.    Past Medical History:  Diagnosis Date  . Acute respiratory failure with hypoxia ([place]) 01/13/2015  . Atrial flutter ([place])   . Bile leak, postoperative 03/15/2016  . Biliary dyskinesia 02/12/2016  . 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 Cardiomyopathy   . CHF (congestive heart failure) ([place])   . Coronary artery disease    normal coronaries by 10/10/08 cath, Cardiac Cath 08-04-12  epic.Dr. Doylene Canard follows  . Diabetes mellitus    pt. states he's borderline diabetic., no longer taking med,- off med. since 2013  . Dialysis patient [facility])    Mon-Wed-Fri([place])- Left AV fistula  . Diverticulitis November 2016   reoccurred in December 2016  . Diverticulitis of intestine without perforation or abscess without bleeding 04/21/2015  . ESRD (end stage renal disease) ([place])   . GERD (gastroesophageal reflux disease)   . Gout   . Hemorrhage of left kidney 01/13/2018  . History of nephrectomy 07/04/2012   History of right nephrectomy in 2002 for renal cell carcinoma   . History of unilateral nephrectomy   . Hypertension   . Low iron   . Myocardial infarction [facility]) ?2006  . Renal cell carcinoma    dialysis- MWF- Dr. Mercy Moore follows.  . Renal insufficiency   . Shortness of breath 05/19/11   "at rest, lying down, w/exertion"  . Stroke [facility]) 02/2011   05/19/11 denies residual  . Umbilical hernia 35/57/32   unrepaired    Patient Active Problem List   Diagnosis Date Noted  . Hypotension of hemodialysis 01/18/2019  . Dizziness 11/22/2018  . Hypertensive heart and CKD, ESRD on dialysis, w CHF ([place]) 11/09/2018  . 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 Vertigo 07/31/2018  . Acute lower GI bleeding 06/21/2018  . Supratherapeutic INR 06/20/2018  . Melena 06/20/2018  . Malnutrition of moderate degree 02/10/2018  . Anemia 01/27/2018  . HCAP (healthcare-associated pneumonia)  01/26/2018  . CAD (coronary artery disease) 01/26/2018  . Atrial fibrillation, chronic 01/26/2018  . Depression 01/26/2018  . GERD (gastroesophageal reflux disease) 01/26/2018  . Pulmonary embolism ([place]) 01/26/2018  . History of atrial flutter 01/13/2018  . Acute respiratory failure with hypoxemia ([place]) 07/17/2017  . 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  . Acute on chronic combined systolic and diastolic CHF, NYHA class 4 ([place]) 07/17/2017  . Unresponsiveness 03/16/2017  . Hypotension 03/16/2017  .  Paroxysmal atrial flutter ([place]) 03/16/2017  . Chronic pain 03/16/2017  . Leg pain, bilateral 03/16/2017  . Elevated troponin 03/15/2016  . Rib pain on right side   . Atrial flutter with rapid ventricular response ([place]) 06/27/2015  . Pulmonary edema 01/13/2015  . Right upper quadrant abdominal pain 01/13/2015  . Diverticulitis large intestine w/o perforation or abscess w/o bleeding 12/18/2014  . Hepatic cyst 12/18/2014  . Aftercare following surgery of the circulatory system, [place] 06/22/2013  . Right sided weakness 10/05/2012  . History of nephrectomy 07/04/2012  . End-stage renal disease on hemodialysis ([place]) 06/04/2011  . Chronic combined systolic (congestive) and diastolic (congestive) heart failure ([place]) 05/19/2011    Class: Acute  . Hypertension, benign 05/19/2011    Class: Chronic  . DM II (diabetes mellitus, type II), controlled ([place]) 05/19/2011    Class: Chronic  . Gout, chronic 05/19/2011    Past Surgical History:  Procedure Laterality Date  . ANAL FISTULOTOMY  08/15/2018  . AV FISTULA PLACEMENT  03/29/2011   Procedure: ARTERIOVENOUS (AV) FISTULA CREATION;  Surgeon: Hinda Lenis, MD;  Location: [facility];  Service: Vascular;  Laterality: Left;  LEFT RADIOCEPHALIC , Arteriovenous [id number])  . CARDIAC CATHETERIZATION  05/21/11  . CARDIAC CATHETERIZATION N/A 03/16/2016   Procedure: Left Heart Cath and Coronary Angiography;  Surgeon: Dixie Dials, MD;  Location: [facility] CV LAB;  Service: Cardiovascular;  Laterality: N/A;  . CHOLECYSTECTOMY N/A 02/12/2016   Procedure: LAPAROSCOPIC CHOLECYSTECTOMY WITH INTRAOPERATIVE CHOLANGIOGRAM;  Surgeon: Jackolyn Confer, MD;  Location: [facility];  Service: General;  Laterality: N/A;  . COLONOSCOPY WITH PROPOFOL N/A 02/01/2017   Procedure: COLONOSCOPY WITH PROPOFOL;  Surgeon: Juanita Craver, MD;  Location: [facility] ENDOS[facility];  Service: Endoscopy;  Laterality: N/A;  . dialysis cath placed    . ESOPHAGOGASTRODUODENOSCOPY (EGD) WITH PROPOFOL N/A  12/02/2015   Procedure: ESOPHAGOGASTRODUODENOSCOPY (EGD) WITH PROPOFOL;  Surgeon: Juanita Craver, MD;  Location: [facility];  Service: Endoscopy;  Laterality: N/A;  . EVALUATION UNDER ANESTHESIA WITH HEMORRHOIDECTOMY N/A 08/15/2018   Procedure: HEMORRHOID LIGATION/PEXY;  Surgeon: Michael Boston, MD;  Location: [facility];  Service: General;  Laterality: N/A;  . FINGER SURGERY     L pinkie finger- ORIF- /w remaining hardware - 1990's    . FISTULOTOMY N/A 08/15/2018   Procedure: SUPERFICIAL ANAL FISTULOTOMY;  Surgeon: Michael Boston, MD;  Location: [facility];  Service: General;  Laterality: N/A;  . HEMORRHOID SURGERY    . HERNIA REPAIR N/A 7/61/6073   Umbilical hernia repair  . INSERTION OF DIALYSIS CATHETER N/A 01/27/2016   Procedure: INSERTION OF DIALYSIS CATHETER;  Surgeon: Waynetta Sandy, MD;  Location: [facility];  Service: Vascular;  Laterality: N/A;  . INSERTION OF MESH N/A 07/04/2012   Procedure: INSERTION OF MESH;  Surgeon: Madilyn Hook, DO;  Location: [facility];  Service: General;  Laterality: N/A;  . IR EMBO ART  VEN HEMORR LYMPH EXTRAV  INC GUIDE ROADMAPPING  01/13/2018  . IR FLUORO GUIDE CV LINE RIGHT  01/13/2018  . IR IVC FILTER PLMT / S&I /IMG GUID/MOD SED  01/27/2018  . IR RENAL SELECTIVE  UNI INC S&I MOD SED  01/13/2018  . IR US GUIDE VASC ACCESS RIGHT  01/13/2018  . KIDNEY CYST REMOVAL  2019  . LAPAROSCOPIC LYSIS OF ADHESIONS N/A 02/12/2016   Procedure: LAPAROSCOPIC LYSIS OF ADHESIONS;  Surgeon: Jackolyn Confer, MD;  Location: [facility];  Service: General;  Laterality: N/A;  . LEFT AND RIGHT HEART CATHETERIZATION WITH CORONARY ANGIOGRAM N/A 08/04/2012   Procedure: LEFT AND RIGHT HEART CATHETERIZATION WITH CORONARY ANGIOGRAM;  Surgeon: Birdie Riddle, MD;  Location: [facility] CATH LAB;  Service: Cardiovascular;  Laterality: N/A;  . NEPHRECTOMY  2000   right  . REVISON OF ARTERIOVENOUS FISTULA Left 06/05/2013   Procedure: REVISON OF LEFT RADIOCEPHALIC  ARTERIOVENOUS FISTULA;  Surgeon: Conrad [place], MD;   Location: [facility];  Service: Vascular;  Laterality: Left;  . REVISON OF ARTERIOVENOUS FISTULA Left 01/27/2016   Procedure: REVISION OF LEFT UPPER EXTREMITY ARTERIOVENOUS FISTULA;  Surgeon: Waynetta Sandy, MD;  Location: [facility];  Service: Vascular;  Laterality: Left;  . REVISON OF ARTERIOVENOUS FISTULA Left 08/03/2016   Procedure: REVISON OF Left arm ARTERIOVENOUS FISTULA;  Surgeon: Elam Dutch, MD;  Location: [facility];  Service: Vascular;  Laterality: Left;  . REVISON OF ARTERIOVENOUS FISTULA Left 05/06/2017   Procedure: REVISION PLICATION OF ARTERIOVENOUS FISTULA LEFT ARM;  Surgeon: Rosetta Posner, MD;  Location: [facility];  Service: Vascular;  Laterality: Left;  . RIGHT HEART CATHETERIZATION N/A 05/21/2011   Procedure: RIGHT HEART CATH;  Surgeon: Birdie Riddle, MD;  Location: [facility] CATH LAB;  Service: Cardiovascular;  Laterality: N/A;  . smashed  1990's   "left pinky; have a plate in there"  . UMBILICAL HERNIA REPAIR N/A 07/04/2012   Procedure: LAPAROSCOPIC UMBILICAL HERNIA;  Surgeon: Madilyn Hook, DO;  Location: [facility];  Service: General;  Laterality: N/A;  . US ECHOCARDIOGRAPHY  05/20/11        Home Medications    Prior to Admission medications   Medication Sig Start Date End Date Taking? Authorizing Provider  allopurinol (ZYLOPRIM) 100 MG tablet Take 1 tablet (100 mg total) by mouth daily. 11/24/18  Yes Dixie Dials, MD  amiodarone (PACERONE) 200 MG tablet Take 0.5 tablets (100 mg total) by mouth daily. 07/21/17  Yes Regalado, Belkys A, MD  calcium acetate (PHOSLO) 667 MG capsule Take 1-3 capsules (667-2,001 mg total) by mouth See admin instructions. Take 2001 mg capsules with meals three times daily and 667 mg capsule with snacks. 11/23/18  Yes Dixie Dials, MD  chlorhexidine (PERIDEX) 0.12 % solution 15 mLs by Mouth Rinse route daily as needed (dryness).  05/29/18  Yes [provider]  ferric citrate (AURYXIA) 1 GM 210 MG(Fe) tablet Take 210 mg by mouth 3 (three) times daily with  meals.    Yes [provider]  Lidocaine, Anorectal, (L-M-X 5) 5 % CREA Apply 1 application topically 4 (four) times daily as needed (apply to area up to 6 times daily as needed). Patient taking differently: Apply 1 application topically See admin instructions. Apply up to six times daily as needed for discomfort 07/09/18  Yes Gareth Morgan, MD  lidocaine-prilocaine (EMLA) cream Apply 1 application topically See admin instructions. Apply topically to port access prior to dialysis - Monday, Wednesday, Friday 04/08/15  Yes [provider]  meclizine (ANTIVERT) 25 MG tablet Take 1 tablet (25 mg total) by mouth 3 (three) times daily as needed for dizziness. 08/01/18  Yes Debbe Odea, MD  midodrine (PROAMATINE) 10 MG tablet  30 mg  on the days of dyalisis three times a week, and only takes 10 mg on off dyalisis days. Patient taking differently: Take 10-30 mg by mouth See admin instructions. 30 mg  on the days of dialysis three times a week, and only takes 10 mg on off dialysis days. 07/18/18  Yes Rodriguez-Southworth, Sunday Spillers, PA-C  multivitamin (RENA-VIT) TABS tablet Take 1 tablet by mouth at bedtime.   Yes [provider]  pantoprazole (PROTONIX) 40 MG tablet Take 40 mg by mouth 2 (two) times daily.   Yes [provider]  warfarin (COUMADIN) 2.5 MG tablet Take 3 tablets (7.5 mg total) by mouth daily at 6 PM. 11/23/18  Yes Dixie Dials, MD  nitroGLYCERIN (NITROSTAT) 0.4 MG SL tablet Place 0.4 mg under the tongue every 5 (five) minutes as needed for chest pain.  05/20/15   [provider]  oxyCODONE-acetaminophen (PERCOCET/ROXICET) 5-325 MG tablet Take 2 tablets by mouth every 6 (six) hours as needed for severe pain. Patient not taking: Reported on 01/18/2019 11/16/18   Hayden Rasmussen, MD  UNABLE TO FIND Out patient physical therapy- vestibular therapy for BPPV 08/01/18   Debbe Odea, MD    Family History Family History  Problem Relation Age of Onset  .  Hypertension Mother   . Kidney disease Mother     Social History Social History   Tobacco Use  . Smoking status: Never Smoker  . Smokeless tobacco: Never Used  Substance Use Topics  . Alcohol use: No    Alcohol/week: 0.0 standard drinks  . Drug use: No    Types: Cocaine    Comment: Past hx-none in 20 yrs     Allergies   Ace inhibitors   Review of Systems Review of Systems  Constitutional: Positive for fatigue. Negative for chills and fever.  Respiratory: Positive for shortness of breath. Negative for cough.   Cardiovascular: Negative for chest pain.  Gastrointestinal: Positive for diarrhea, nausea and vomiting. Negative for abdominal pain.  Neurological: Positive for weakness (Generalized) and light-headedness. Negative for syncope and numbness.  All other systems reviewed and are negative.    Physical Exam Updated Vital Signs BP (!) 88/58   Pulse 79   Temp 98 F (36.7 C) (Oral)   Resp 13   Ht 6\' 1"  (1.854 m)   Wt 111.1 kg   SpO2 98%   BMI 32.32 kg/m   Physical Exam Vitals signs and nursing note reviewed.  Constitutional:      General: He is not in acute distress.    Appearance: He is well-developed.  HENT:     Head: Normocephalic and atraumatic.  Eyes:     General:        Right eye: No discharge.        Left eye: No discharge.     Conjunctiva/sclera: Conjunctivae normal.  Neck:     Vascular: No JVD.     Trachea: No tracheal deviation.  Cardiovascular:     Rate and Rhythm: Normal rate and regular rhythm.     Heart sounds: Murmur present.     Comments: AV fistula in the left upper extremity with 1.5 cm ulceration, actively pulsating Pulmonary:     Effort: Pulmonary effort is normal.     Comments: Globally diminished breath sounds, speaking in full sentences without difficulty. Abdominal:     General: Abdomen is protuberant. There is no distension.     Palpations: Abdomen is soft.     Tenderness: There is no abdominal tenderness.  There is no  guarding.  Musculoskeletal: Normal range of motion.        General: No swelling or tenderness.  Skin:    General: Skin is warm and dry.     Findings: No erythema.  Neurological:     General: No focal deficit present.     Mental Status: He is alert and oriented to person, place, and time.     Comments: Fluent speech, no facial droop.  Moves all extremities spontaneously without difficulty.  Psychiatric:        Behavior: Behavior normal.      ED Treatments / Results  Labs (all labs ordered are listed, but only abnormal results are displayed) Labs Reviewed  BASIC METABOLIC PANEL - Abnormal; Notable for the following components:      Result Value   Chloride 94 (*)    BUN 34 (*)    Creatinine, Ser 10.12 (*)    GFR calc non Af Amer 5 (*)    GFR calc Af Amer 6 (*)    Anion gap 17 (*)    All other components within normal limits  CBC WITH DIFFERENTIAL/PLATELET - Abnormal; Notable for the following components:   RBC 3.50 (*)    Hemoglobin 11.2 (*)    HCT 35.0 (*)    RDW 16.8 (*)    nRBC 0.5 (*)    All other components within normal limits  LACTIC ACID, PLASMA - Abnormal; Notable for the following components:   Lactic Acid, Venous 2.0 (*)    All other components within normal limits  LACTIC ACID, PLASMA - Abnormal; Notable for the following components:   Lactic Acid, Venous 2.5 (*)    All other components within normal limits  SARS CORONAVIRUS 2 (HOSPITAL ORDER, [place])  CULTURE, BLOOD (ROUTINE X 2)  CULTURE, BLOOD (ROUTINE X 2)  PROCALCITONIN    EKG None  Radiology Dg Chest Portable 1 View  Result Date: 01/18/2019 CLINICAL DATA:  Weakness, dizziness and hypotension. EXAM: PORTABLE CHEST 1 VIEW COMPARISON:  11/22/2018 and prior radiographs FINDINGS: Cardiomegaly and elevated LEFT hemidiaphragm again noted. There is no evidence of focal airspace disease, pulmonary edema, suspicious pulmonary nodule/mass, pleural effusion, or pneumothorax. No  acute bony abnormalities are identified. IMPRESSION: Cardiomegaly without evidence of acute cardiopulmonary disease. Electronically Signed   By: Margarette Canada M.D.   On: 01/18/2019 13:23    Procedures Procedures (including critical care time)  Medications Ordered in ED Medications  sodium chloride 0.9 % bolus 500 mL (0 mLs Intravenous Stopped 01/18/19 1439)  sodium chloride 0.9 % bolus 500 mL (500 mLs Intravenous New Bag/Given 01/18/19 1600)     Initial Impression / Assessment and Plan / ED Course  I have reviewed the triage vital signs and the nursing notes.  Pertinent labs & imaging results that were available during my care of the patient were reviewed by me and considered in my medical decision making (see chart for details).        Patient presenting for evaluation of hypotension with lightheadedness and dyspnea on exertion.  He is afebrile, persistently hypotensive in the ED.  His baseline blood pressures are typically soft but this is lower than usual and he is symptomatic with lightheadedness and dyspnea on exertion.  No focal neurologic deficits.  Lab work reviewed by me shows anemia improved from patient's baseline, chronic renal insufficiency.Initial lactate elevated at 2.0.  However, I have a low suspicion of acute infectious etiology/sepsis as the patient has a  reassuring chest x-ray with no evidence of effusion or infiltrate and does not currently produce urine as he has been on dialysis for 8 years.  Blood cultures obtained. I suspect that he is dehydrated secondary to dialysis and persistent diarrhea for 4 days; his blood pressure did improve somewhat after a small fluid bolus.  However given his ESRD, I do not want to aggressively rehydrate him.  COVID test is negative.  Spoke with Dr. Evangeline Gula with [facility] hospitalist service who agrees to assume care of patient and bring him into the hospital for further evaluation and management.  Final Clinical Impressions(s) / ED Diagnoses    Final diagnoses:  Hypotension, unspecified hypotension type  Elevated lactic acid level    ED Discharge Orders    None       Debroah Baller 01/18/19 1613    Davonna Belling, MD 01/19/19 [phone]

## 2019-01-18 NOTE — Progress Notes (Addendum)
Subjective:     Patient ID: [name] , male    DOB: 1961-01-03 , 58 y.o.   MRN: [id number]   Chief Complaint  Patient presents with  . Hypertension    HPI Pt is here due to being dizzy for 3 days and had dialysis yesterday and feels very weak as when he ends up having to go to ER. Denies CP or SOB. Had dialysis yesterday and his BP before it was 90/60. Had dialysis Monday as well but could not finish it due to cramping. Does not know what his BP was then.  Has diarrhea x 2 yesterday and once now while here. Thought it was something he ate yesterday.   Past Medical History:  Diagnosis Date  . Acute respiratory failure with hypoxia ([place]) 01/13/2015  . Atrial flutter ([place])   . Bile leak, postoperative 03/15/2016  . Biliary dyskinesia 02/12/2016  . 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 Cardiomyopathy   . CHF (congestive heart failure) ([place])   . Coronary artery disease    normal coronaries by 10/10/08 cath, Cardiac Cath 08-04-12 epic.Dr. Doylene Canard follows  . Diabetes mellitus    pt. states he's borderline diabetic., no longer taking med,- off med. since 2013  . Dialysis patient [facility])    Mon-Wed-Fri([place])- Left AV fistula  . Diverticulitis November 2016   reoccurred in December 2016  . Diverticulitis of intestine without perforation or abscess without bleeding 04/21/2015  . ESRD (end stage renal disease) ([place])   . GERD (gastroesophageal reflux disease)   . Gout   . Hemorrhage of left kidney 01/13/2018  . History of nephrectomy 07/04/2012   History of right nephrectomy in 2002 for renal cell carcinoma   . History of unilateral nephrectomy   . Hypertension   . Low iron   . Myocardial infarction [facility]) ?2006  . Renal cell carcinoma    dialysis- MWF- Dr. Mercy Moore follows.  . Renal insufficiency   . Shortness of breath 05/19/11   "at rest, lying down, w/exertion"  . Stroke [facility]) 02/2011   05/19/11 denies residual  . Umbilical hernia 18/84/16    unrepaired     Family History  Problem Relation Age of Onset  . Hypertension Mother   . Kidney disease Mother      Current Outpatient Medications:  .  allopurinol (ZYLOPRIM) 100 MG tablet, Take 1 tablet (100 mg total) by mouth daily., Disp: 30 tablet, Rfl: 3 .  amiodarone (PACERONE) 200 MG tablet, Take 0.5 tablets (100 mg total) by mouth daily., Disp: 30 tablet, Rfl: 1 .  calcium acetate (PHOSLO) 667 MG capsule, Take 1-3 capsules (667-2,001 mg total) by mouth See admin instructions. Take 2001 mg capsules with meals three times daily and 667 mg capsule with snacks., Disp:  , Rfl:  .  chlorhexidine (PERIDEX) 0.12 % solution, 15 mLs by Mouth Rinse route daily as needed (dryness). , Disp: , Rfl:  .  ferric citrate (AURYXIA) 1 GM 210 MG(Fe) tablet, Take 210 mg by mouth 3 (three) times daily with meals. , Disp: , Rfl:  .  Lidocaine, Anorectal, (L-M-X 5) 5 % CREA, Apply 1 application topically 4 (four) times daily as needed (apply to area up to 6 times daily as needed). (Patient taking differently: Apply 1 application topically See admin instructions. Apply up to six times daily as needed for discomfort), Disp: 1 Tube, Rfl: 0 .  lidocaine-prilocaine (EMLA) cream, Apply 1 application topically See admin  instructions. Apply topically to port access prior to dialysis - Monday, Wednesday, Friday, Disp: , Rfl: 12 .  meclizine (ANTIVERT) 25 MG tablet, Take 1 tablet (25 mg total) by mouth 3 (three) times daily as needed for dizziness., Disp: 30 tablet, Rfl: 0 .  midodrine (PROAMATINE) 10 MG tablet, 30 mg  on the days of dyalisis three times a week, and only takes 10 mg on off dyalisis days. (Patient taking differently: Take 10-15 mg by mouth See admin instructions. 15 mg  on the days of dialysis three times a week, and only takes 10 mg on off dialysis days.), Disp: 1 tablet, Rfl: 0 .  multivitamin (RENA-VIT) TABS tablet, Take 1 tablet by mouth at bedtime., Disp: , Rfl:  .  nitroGLYCERIN (NITROSTAT) 0.4 MG  SL tablet, Place 0.4 mg under the tongue every 5 (five) minutes as needed for chest pain. , Disp: , Rfl: 0 .  oxyCODONE-acetaminophen (PERCOCET/ROXICET) 5-325 MG tablet, Take 2 tablets by mouth every 6 (six) hours as needed for severe pain., Disp: 15 tablet, Rfl: 0 .  pantoprazole (PROTONIX) 40 MG tablet, Take 40 mg by mouth 2 (two) times daily., Disp: , Rfl:  .  UNABLE TO FIND, Out patient physical therapy- vestibular therapy for BPPV, Disp: 1 each, Rfl: 0 .  warfarin (COUMADIN) 2.5 MG tablet, Take 3 tablets (7.5 mg total) by mouth daily at 6 PM., Disp: , Rfl:    Allergies  Allergen Reactions  . Ace Inhibitors Itching and Cough     Review of Systems  No CP. SOB, blood in stools or syncope. Denies fever, chills or sweats. Had diarrhea x 2 yesterday and x 1 today. Denies blood in stool. Has been having waves of abdominal cramps on RLQ.  Today's Vitals   01/18/19 1109  Pulse: 66  Temp: 98.1 F (36.7 C)  TempSrc: Oral  Weight: 244 lb (110.7 kg)  Height: 6\' 1"  (1.854 m)   Body mass index is 32.19 kg/m.   Objective:  Physical Exam Vitals signs and nursing note reviewed.  Constitutional:      Appearance: He is ill-appearing. He is not diaphoretic.     Comments: Looks weak  HENT:     Right Ear: External ear normal.     Left Ear: External ear normal.     Nose: Nose normal.  Eyes:     General: No scleral icterus. Cardiovascular:     Rate and Rhythm: Tachycardia present.     Comments: Radial pulse hard to palpate to do palpable BP Pulmonary:     Effort: Pulmonary effort is normal.     Breath sounds: Normal breath sounds.  Abdominal:     General: There is no distension.     Palpations: There is no mass.     Tenderness: There is no abdominal tenderness. There is no guarding or rebound.  Skin:    General: Skin is warm.  Neurological:     Mental Status: He is oriented to person, place, and time.  Psychiatric:        Mood and Affect: Mood normal.        Behavior: Behavior  normal.     Assessment And Plan:    1. Diarrhea of presumed infectious origin- acute - Culture, Stool - Ova and parasite examination  2. Weakness- acute      EMS called and sent to ER for work up  3. Other specified hypotension- chronic, but worsening.     EMS called to be sent to ER  for work up.   4- Acute on chronic combined systolic and diastolic CHF  class 4- No action, will continue      FU with his cardiologist.   NOTE: EMS got palpable faint BP of 70  Khalis Hittle RODRIGUEZ-SOUTHWORTH, PA-C    THE PATIENT IS ENCOURAGED TO PRACTICE SOCIAL DISTANCING DUE TO THE COVID-19 PANDEMIC.

## 2019-01-18 NOTE — ED Triage Notes (Signed)
Patient presents to ed  Via [facility]  States he is coming from his doctors office  With dizziness and hypotension. , Patient states he is a M-W-F dialysis patient states ;he had to come off dialysis 30 mins early yest because his b/p  Was too low. Patient is currently alert oriented, states he is fine as long as he is laying still. C/o dizziness with movement. States he feels sob. C/o diarrhea  X 3 days states he always has diarrhea after dialysis however this is lasting longer than normal. States his systolic b/p usually runs in the low 90's and 80's.

## 2019-01-18 NOTE — Consult Note (Addendum)
[facility] Renal Consultation Note    Indication for Consultation:  Management of ESRD/hemodialysis, anemia, hypertension/volume, and secondary hyperparathyroidism. PCP:  HPI: [name] is a 58 y.o. male with ESRD, Type 2 DM, A-fib (on warfarin), Hx nephrectomy for RCCa, and Hx diverticulitis who is being admitted OBS status with weakness and hypotension.   He reports that has felt very weak and dizzy for nearly 1 week. No acute illness. Feels pre-syncopal with standing and movements, and occasionally has dyspnea. No CP or abdominal pain. Has chronic hypotension for which is prescribed midodrine 10mg  TID, but reports has only been taking once daily (this is not a new change). Has chronic intermittent diarrhea, perhaps has been more frequent this week. No fever or chills. No known COVID contacts.  In ED, found to be hypotensive with SBP ~70. No hypoxia - breathing fine on room air. Initial labs showed K 4, WBC 6.5, Hgb 11.2, but with a lactic acid of 2. CXR clear. COVID-19 negative. He was given 1L IVFs. Blood cultures drawn. Plan is to admit overnight to observe him.  He dialyzes on MWF schedule - will be due for dialysis tomorrow. Using L AVF as his access - has a pretty ugly looking scab on distal aneursym. Per notes, the HD clinic has been trying to arrange vascular surgery evaluation. He denies pain, bleeding or pus drainage at home.  Past Medical History:  Diagnosis Date  . Acute respiratory failure with hypoxia ([place]) 01/13/2015  . Atrial flutter ([place])   . Bile leak, postoperative 03/15/2016  . Biliary dyskinesia 02/12/2016  . 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 Cardiomyopathy   . CHF (congestive heart failure) ([place])   . Coronary artery disease    normal coronaries by 10/10/08 cath, Cardiac Cath 08-04-12 epic.Dr. Doylene Canard follows  . Diabetes mellitus    pt. states he's borderline diabetic., no longer taking med,- off med. since 2013  . Dialysis  patient [facility])    Mon-Wed-Fri([place])- Left AV fistula  . Diverticulitis November 2016   reoccurred in December 2016  . Diverticulitis of intestine without perforation or abscess without bleeding 04/21/2015  . ESRD (end stage renal disease) ([place])   . GERD (gastroesophageal reflux disease)   . Gout   . Hemorrhage of left kidney 01/13/2018  . History of nephrectomy 07/04/2012   History of right nephrectomy in 2002 for renal cell carcinoma   . History of unilateral nephrectomy   . Hypertension   . Low iron   . Myocardial infarction [facility]) ?2006  . Renal cell carcinoma    dialysis- MWF- Dr. Mercy Moore follows.  . Renal insufficiency   . Shortness of breath 05/19/11   "at rest, lying down, w/exertion"  . Stroke [facility]) 02/2011   05/19/11 denies residual  . Umbilical hernia 27/06/23   unrepaired   Past Surgical History:  Procedure Laterality Date  . ANAL FISTULOTOMY  08/15/2018  . AV FISTULA PLACEMENT  03/29/2011   Procedure: ARTERIOVENOUS (AV) FISTULA CREATION;  Surgeon: Hinda Lenis, MD;  Location: [place];  Service: Vascular;  Laterality: Left;  LEFT RADIOCEPHALIC , Arteriovenous [id number])  . CARDIAC CATHETERIZATION  05/21/11  . CARDIAC CATHETERIZATION N/A 03/16/2016   Procedure: Left Heart Cath and Coronary Angiography;  Surgeon: Dixie Dials, MD;  Location: [place] CV LAB;  Service: Cardiovascular;  Laterality: N/A;  . CHOLECYSTECTOMY N/A 02/12/2016   Procedure: LAPAROSCOPIC CHOLECYSTECTOMY WITH INTRAOPERATIVE CHOLANGIOGRAM;  Surgeon: Jackolyn Confer, MD;  [facility] OR;  Service: General;  Laterality: N/A;  . COLONOSCOPY WITH PROPOFOL N/A 02/01/2017   Procedure: COLONOSCOPY WITH PROPOFOL;  Surgeon: Juanita Craver, MD;  Location: [facility] ENDOSCOPY;  Service: Endoscopy;  Laterality: N/A;  . dialysis cath placed    . ESOPHAGOGASTRODUODENOSCOPY (EGD) WITH PROPOFOL N/A 12/02/2015   Procedure: ESOPHAGOGASTRODUODENOSCOPY (EGD) WITH PROPOFOL;  Surgeon: Juanita Craver, MD;   [facility];  Service: Endoscopy;  Laterality: N/A;  . EVALUATION UNDER ANESTHESIA WITH HEMORRHOIDECTOMY N/A 08/15/2018   Procedure: HEMORRHOID LIGATION/PEXY;  Surgeon: Michael Boston, MD;  Location: [place];  Service: General;  Laterality: N/A;  . FINGER SURGERY     L pinkie finger- ORIF- /w remaining hardware - 1990's    . FISTULOTOMY N/A 08/15/2018   Procedure: SUPERFICIAL ANAL FISTULOTOMY;  Surgeon: Michael Boston, MD;  Location: [place];  Service: General;  Laterality: N/A;  . HEMORRHOID SURGERY    . HERNIA REPAIR N/A 8/33/8250   Umbilical hernia repair  . INSERTION OF DIALYSIS CATHETER N/A 01/27/2016   Procedure: INSERTION OF DIALYSIS CATHETER;  Surgeon: Waynetta Sandy, MD;  Location: [place];  Service: Vascular;  Laterality: N/A;  . INSERTION OF MESH N/A 07/04/2012   Procedure: INSERTION OF MESH;  Surgeon: Madilyn Hook, DO;  Location: [place];  Service: General;  Laterality: N/A;  . IR EMBO ART  VEN HEMORR LYMPH EXTRAV  INC GUIDE ROADMAPPING  01/13/2018  . IR FLUORO GUIDE CV LINE RIGHT  01/13/2018  . IR IVC FILTER PLMT / S&I /IMG GUID/MOD SED  01/27/2018  . IR RENAL SELECTIVE  UNI INC S&I MOD SED  01/13/2018  . IR US GUIDE VASC ACCESS RIGHT  01/13/2018  . KIDNEY CYST REMOVAL  2019  . LAPAROSCOPIC LYSIS OF ADHESIONS N/A 02/12/2016   Procedure: LAPAROSCOPIC LYSIS OF ADHESIONS;  Surgeon: Jackolyn Confer, MD;  Location: [place];  Service: General;  Laterality: N/A;  . LEFT AND RIGHT HEART CATHETERIZATION WITH CORONARY ANGIOGRAM N/A 08/04/2012   Procedure: LEFT AND RIGHT HEART CATHETERIZATION WITH CORONARY ANGIOGRAM;  Surgeon: Birdie Riddle, MD;  Location: [place] CATH LAB;  Service: Cardiovascular;  Laterality: N/A;  . NEPHRECTOMY  2000   right  . REVISON OF ARTERIOVENOUS FISTULA Left 06/05/2013   Procedure: REVISON OF LEFT RADIOCEPHALIC  ARTERIOVENOUS FISTULA;  Surgeon: Conrad Macomb, MD;  Location: [place];  Service: Vascular;  Laterality: Left;  . REVISON OF ARTERIOVENOUS FISTULA Left 01/27/2016    Procedure: REVISION OF LEFT UPPER EXTREMITY ARTERIOVENOUS FISTULA;  Surgeon: Waynetta Sandy, MD;  Location: [place];  Service: Vascular;  Laterality: Left;  . REVISON OF ARTERIOVENOUS FISTULA Left 08/03/2016   Procedure: REVISON OF Left arm ARTERIOVENOUS FISTULA;  Surgeon: Elam Dutch, MD;  Location: [place];  Service: Vascular;  Laterality: Left;  . REVISON OF ARTERIOVENOUS FISTULA Left 05/06/2017   Procedure: REVISION PLICATION OF ARTERIOVENOUS FISTULA LEFT ARM;  Surgeon: Rosetta Posner, MD;  Location: [place];  Service: Vascular;  Laterality: Left;  . RIGHT HEART CATHETERIZATION N/A 05/21/2011   Procedure: RIGHT HEART CATH;  Surgeon: Birdie Riddle, MD;  Location: [facility] CATH LAB;  Service: Cardiovascular;  Laterality: N/A;  . smashed  1990's   "left pinky; have a plate in there"  . UMBILICAL HERNIA REPAIR N/A 07/04/2012   Procedure: LAPAROSCOPIC UMBILICAL HERNIA;  Surgeon: Madilyn Hook, DO;  Location: [place];  Service: General;  Laterality: N/A;  . US ECHOCARDIOGRAPHY  05/20/11   Family History  Problem Relation Age of Onset  . Hypertension Mother   .  Kidney disease Mother    Social History:  reports that he has never smoked. He has never used smokeless tobacco. He reports that he does not drink alcohol or use drugs.  ROS: As per HPI otherwise negative.  Physical Exam: Vitals:   01/18/19 1330 01/18/19 1400 01/18/19 1430 01/18/19 1515  BP: (!) 76/60 (!) 84/65 (!) 90/57 (!) 88/58  Pulse: 85   79  Resp: 19 15 14 13   Temp:      TempSrc:      SpO2: 98%   98%  Weight:      Height:         General: Well developed, well nourished, in no acute distress. On room air. Head: Normocephalic, atraumatic, sclera non-icteric, mucus membranes are moist. Neck: Supple without lymphadenopathy/masses. JVD not elevated. Lungs: Clear bilaterally to auscultation without wheezes, rales, or rhonchi. Breathing is unlabored. Heart: RRR with normal S1, S2. No murmurs, rubs, or gallops  appreciated. Abdomen: Soft, non-tender, non-distended with normoactive bowel sounds.  Musculoskeletal:  Strength and tone appear normal for age. Lower extremities: No edema or ischemic changes, no open wounds. Neuro: Alert and oriented X 3. Moves all extremities spontaneously. Psych:  Responds to questions appropriately with a normal affect. Dialysis Access: L forearm AVF + thrill, two aneurysmal areas, both with hypopigmentation. Distal aneurysm with > 1cm scabbed area - has been there > 1 mo per patient.  Allergies  Allergen Reactions  . Ace Inhibitors Itching and Cough   Prior to Admission medications   Medication Sig Start Date End Date Taking? Authorizing Provider  allopurinol (ZYLOPRIM) 100 MG tablet Take 1 tablet (100 mg total) by mouth daily. 11/24/18  Yes Dixie Dials, MD  amiodarone (PACERONE) 200 MG tablet Take 0.5 tablets (100 mg total) by mouth daily. 07/21/17  Yes Regalado, Belkys A, MD  calcium acetate (PHOSLO) 667 MG capsule Take 1-3 capsules (667-2,001 mg total) by mouth See admin instructions. Take 2001 mg capsules with meals three times daily and 667 mg capsule with snacks. 11/23/18  Yes Dixie Dials, MD  chlorhexidine (PERIDEX) 0.12 % solution 15 mLs by Mouth Rinse route daily as needed (dryness).  05/29/18  Yes [provider]  ferric citrate (AURYXIA) 1 GM 210 MG(Fe) tablet Take 210 mg by mouth 3 (three) times daily with meals.    Yes [provider]  Lidocaine, Anorectal, (L-M-X 5) 5 % CREA Apply 1 application topically 4 (four) times daily as needed (apply to area up to 6 times daily as needed). Patient taking differently: Apply 1 application topically See admin instructions. Apply up to six times daily as needed for discomfort 07/09/18  Yes Gareth Morgan, MD  lidocaine-prilocaine (EMLA) cream Apply 1 application topically See admin instructions. Apply topically to port access prior to dialysis - Monday, Wednesday, Friday 04/08/15  Yes [provider]  meclizine (ANTIVERT) 25 MG tablet Take 1 tablet (25 mg total) by mouth 3 (three) times daily as needed for dizziness. 08/01/18  Yes Debbe Odea, MD  midodrine (PROAMATINE) 10 MG tablet 30 mg  on the days of dyalisis three times a week, and only takes 10 mg on off dyalisis days. Patient taking differently: Take 10-30 mg by mouth See admin instructions. 30 mg  on the days of dialysis three times a week, and only takes 10 mg on off dialysis days. 07/18/18  Yes Rodriguez-Southworth, Sunday Spillers, PA-C  multivitamin (RENA-VIT) TABS tablet Take 1 tablet by mouth at bedtime.   Yes [provider]  pantoprazole (PROTONIX) 40  MG tablet Take 40 mg by mouth 2 (two) times daily.   Yes [provider]  warfarin (COUMADIN) 2.5 MG tablet Take 3 tablets (7.5 mg total) by mouth daily at 6 PM. 11/23/18  Yes Dixie Dials, MD  nitroGLYCERIN (NITROSTAT) 0.4 MG SL tablet Place 0.4 mg under the tongue every 5 (five) minutes as needed for chest pain.  05/20/15   [provider]  oxyCODONE-acetaminophen (PERCOCET/ROXICET) 5-325 MG tablet Take 2 tablets by mouth every 6 (six) hours as needed for severe pain. Patient not taking: Reported on 01/18/2019 11/16/18   Hayden Rasmussen, MD  UNABLE TO FIND Out patient physical therapy- vestibular therapy for BPPV 08/01/18   Debbe Odea, MD   No current facility-administered medications for this encounter.    Current Outpatient Medications  Medication Sig Dispense Refill  . allopurinol (ZYLOPRIM) 100 MG tablet Take 1 tablet (100 mg total) by mouth daily. 30 tablet 3  . amiodarone (PACERONE) 200 MG tablet Take 0.5 tablets (100 mg total) by mouth daily. 30 tablet 1  . calcium acetate (PHOSLO) 667 MG capsule Take 1-3 capsules (667-2,001 mg total) by mouth See admin instructions. Take 2001 mg capsules with meals three times daily and 667 mg capsule with snacks.    . chlorhexidine (PERIDEX) 0.12 % solution 15 mLs by Mouth Rinse route daily as needed (dryness).      . ferric citrate (AURYXIA) 1 GM 210 MG(Fe) tablet Take 210 mg by mouth 3 (three) times daily with meals.     . Lidocaine, Anorectal, (L-M-X 5) 5 % CREA Apply 1 application topically 4 (four) times daily as needed (apply to area up to 6 times daily as needed). (Patient taking differently: Apply 1 application topically See admin instructions. Apply up to six times daily as needed for discomfort) 1 Tube 0  . lidocaine-prilocaine (EMLA) cream Apply 1 application topically See admin instructions. Apply topically to port access prior to dialysis - Monday, Wednesday, Friday  12  . meclizine (ANTIVERT) 25 MG tablet Take 1 tablet (25 mg total) by mouth 3 (three) times daily as needed for dizziness. 30 tablet 0  . midodrine (PROAMATINE) 10 MG tablet 30 mg  on the days of dyalisis three times a week, and only takes 10 mg on off dyalisis days. (Patient taking differently: Take 10-30 mg by mouth See admin instructions. 30 mg  on the days of dialysis three times a week, and only takes 10 mg on off dialysis days.) 1 tablet 0  . multivitamin (RENA-VIT) TABS tablet Take 1 tablet by mouth at bedtime.    . pantoprazole (PROTONIX) 40 MG tablet Take 40 mg by mouth 2 (two) times daily.    Marland Kitchen warfarin (COUMADIN) 2.5 MG tablet Take 3 tablets (7.5 mg total) by mouth daily at 6 PM.    . nitroGLYCERIN (NITROSTAT) 0.4 MG SL tablet Place 0.4 mg under the tongue every 5 (five) minutes as needed for chest pain.   0  . oxyCODONE-acetaminophen (PERCOCET/ROXICET) 5-325 MG tablet Take 2 tablets by mouth every 6 (six) hours as needed for severe pain. (Patient not taking: Reported on 01/18/2019) 15 tablet 0  . UNABLE TO FIND Out patient physical therapy- vestibular therapy for BPPV 1 each 0   Labs: Basic Metabolic Panel: Recent Labs  Lab 01/18/19 1300  NA 137  K 4.0  CL 94*  CO2 26  GLUCOSE 84  BUN 34*  CREATININE 10.12*  CALCIUM 9.1   CBC: Recent Labs  Lab 01/18/19 1300  WBC  6.5  NEUTROABS 4.2  HGB 11.2*  HCT 35.0*   MCV 100.0  PLT 228   Studies/Results: Dg Chest Portable 1 View  Result Date: 01/18/2019 CLINICAL DATA:  Weakness, dizziness and hypotension. EXAM: PORTABLE CHEST 1 VIEW COMPARISON:  11/22/2018 and prior radiographs FINDINGS: Cardiomegaly and elevated LEFT hemidiaphragm again noted. There is no evidence of focal airspace disease, pulmonary edema, suspicious pulmonary nodule/mass, pleural effusion, or pneumothorax. No acute bony abnormalities are identified. IMPRESSION: Cardiomegaly without evidence of acute cardiopulmonary disease. Electronically Signed   By: Margarette Canada M.D.   On: 01/18/2019 13:23    Dialysis Orders:  MWF at [facility] 4hr, 450/800, EDW 110kg, 2K/2Ca, AVF, heparin 4000 bolus - Hectoral 37mcg IV q HD - Micr 35mcg IV q 2 weeks - Parsabiv 15mg  IV q HD  Assessment/Plan: 1.  Hypotension/dizziness: Acute on chronic hypotension - is prescribed midodrine TID - not taking as prescribed, although he is taking "same as always." Blood Cx drawn pending. Might want to consider echo as well. Given 1L IVFs , does appear a little dry - will plan to allow volume to come up a bit and raise EDW, will also plan to give the midodrine as prescribed and follow. 2. AVF scab/deterioration: Will consult VVS to assess. Not infected, but certainly worrisome. 3.  ESRD: Will continue HD per usual MWF schedule - next 8/28.  4.  Volume: Perhaps slightly volume depleted. CXR clear, no edema. Minimal to no UF with next HD. 5.  Anemia: Hgb > 11. No ESA needed for now. 6.  Metabolic bone disease: Ca ok, continue home binders and VDRA. Parsabiv not on formulary. 7.  Type 2 DM 8.  A-fib/flutter: On warfarin  Veneta Penton, PA-C 01/18/2019, 4:03 PM  [facility] Pager: [phone]  I have seen and examined this patient and agree with plan and assessment in the above note with renal recommendations/intervention highlighted.  He also reports having some N/V/D for the past 2 days and is being ruled  out for C. Diff and will need vascular surgery evaluation of ulceration of AVF.  Would cont with midodrine even on non-dialysis days.  Currently hemodynamically stable and able to walk around his room.   Broadus John A Kamare Caspers,MD 01/18/2019 7:22 PM

## 2019-01-18 NOTE — Plan of Care (Signed)
  Problem: Education: Goal: Knowledge of General Education information will improve Description Including pain rating scale, medication(s)/side effects and non-pharmacologic comfort measures Outcome: Progressing   

## 2019-01-18 NOTE — ED Notes (Signed)
ED TO INPATIENT HANDOFF REPORT  ED Nurse Name and Phone #: Annie Main [phone]  S Name/Age/Gender [name] 58 y.o. male Room/Bed: 026C/026C  Code Status   Code Status: Prior  Home/SNF/Other Home Patient oriented to: self, place, time and situation Is this baseline? Yes  Triage Complete: Triage complete  Chief Complaint Hypotension  Triage Note Patient presents to ed  [facility]ia [facility]  States he is coming from his doctors office  With dizziness and hypotension. , Patient states he is a M-W-F dialysis patient states ;he had to come off dialysis 30 mins early yest because his b/p  Was too low. Patient is currently alert oriented, states he is fine as long as he is laying still. C/o dizziness with movement. States he feels sob. C/o diarrhea  X 3 days states he always has diarrhea after dialysis however this is lasting longer than normal. States his systolic b/p usually runs in the low 90's and 80's.    Allergies Allergies  Allergen Reactions  . Ace Inhibitors Itching and Cough    Level of Care/Admitting Diagnosis ED Disposition    ED Disposition Condition [facility] Area: [place] [100100]  Level of Care: Telemetry Medical [104]  I expect the patient will be discharged within 24 hours: Yes  LOW acuity---Tx typically complete <24 hrs---ACUTE conditions typically can be evaluated <24 hours---LABS likely to return to acceptable levels <24 hours---IS near functional baseline---EXPECTED to return to current living arrangement---NOT newly hypoxic: Does not meet criteria for 5C-Observation unit  Covid Evaluation: Asymptomatic Screening Protocol (No Symptoms)  Diagnosis: Hypotension of hemodialysis [458.21.ICD-9-CM]  Admitting Physician: Lady Deutscher [[id number]]  Attending Physician: Lady Deutscher [[id number]]  PT Class (Do Not Modify): Observation [104]  PT Acc Code (Do Not Modify): Observation [10022]       B Medical/Surgery History Past Medical  History:  Diagnosis Date  . Acute respiratory failure with hypoxia ([place]) 01/13/2015  . Atrial flutter ([place])   . Bile leak, postoperative 03/15/2016  . Biliary dyskinesia 02/12/2016  . 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 Cardiomyopathy   . CHF (congestive heart failure) ([place])   . Coronary artery disease    normal coronaries by 10/10/08 cath, Cardiac Cath 08-04-12 epic.Dr. Doylene Canard follows  . Diabetes mellitus    pt. states he's borderline diabetic., no longer taking med,- off med. since 2013  . Dialysis patient [facility])    Mon-Wed-Fri([place])- Left AV fistula  . Diverticulitis November 2016   reoccurred in December 2016  . Diverticulitis of intestine without perforation or abscess without bleeding 04/21/2015  . ESRD (end stage renal disease) ([place])   . GERD (gastroesophageal reflux disease)   . Gout   . Hemorrhage of left kidney 01/13/2018  . History of nephrectomy 07/04/2012   History of right nephrectomy in 2002 for renal cell carcinoma   . History of unilateral nephrectomy   . Hypertension   . Low iron   . Myocardial infarction [facility]) ?2006  . Renal cell carcinoma    dialysis- MWF- Dr. Mercy Moore follows.  . Renal insufficiency   . Shortness of breath 05/19/11   "at rest, lying down, w/exertion"  . Stroke [facility]) 02/2011   05/19/11 denies residual  . Umbilical hernia 90/24/09   unrepaired   Past Surgical History:  Procedure Laterality Date  . ANAL FISTULOTOMY  08/15/2018  . AV FISTULA PLACEMENT  03/29/2011   Procedure: ARTERIOVENOUS (AV) FISTULA  CREATION;  Surgeon: Hinda Lenis, MD;  Location: [place];  Service: Vascular;  Laterality: Left;  LEFT RADIOCEPHALIC , Arteriovenous [id number])  . CARDIAC CATHETERIZATION  05/21/11  . CARDIAC CATHETERIZATION N/A 03/16/2016   Procedure: Left Heart Cath and Coronary Angiography;  Surgeon: Dixie Dials, MD;  Location: [place] CV LAB;  Service: Cardiovascular;  Laterality: N/A;  .  CHOLECYSTECTOMY N/A 02/12/2016   Procedure: LAPAROSCOPIC CHOLECYSTECTOMY WITH INTRAOPERATIVE CHOLANGIOGRAM;  Surgeon: Jackolyn Confer, MD;  Location: [place];  Service: General;  Laterality: N/A;  . COLONOSCOPY WITH PROPOFOL N/A 02/01/2017   Procedure: COLONOSCOPY WITH PROPOFOL;  Surgeon: Juanita Craver, MD;  Location: [facility];  Service: Endoscopy;  Laterality: N/A;  . dialysis cath placed    . ESOPHAGOGASTRODUODENOSCOPY (EGD) WITH PROPOFOL N/A 12/02/2015   Procedure: ESOPHAGOGASTRODUODENOSCOPY (EGD) WITH PROPOFOL;  Surgeon: Juanita Craver, MD;  Location: [facility];  Service: Endoscopy;  Laterality: N/A;  . EVALUATION UNDER ANESTHESIA WITH HEMORRHOIDECTOMY N/A 08/15/2018   Procedure: HEMORRHOID LIGATION/PEXY;  Surgeon: Michael Boston, MD;  Location: [place];  Service: General;  Laterality: N/A;  . FINGER SURGERY     L pinkie finger- ORIF- /w remaining hardware - 1990's    . FISTULOTOMY N/A 08/15/2018   Procedure: SUPERFICIAL ANAL FISTULOTOMY;  Surgeon: Michael Boston, MD;  Location: [place];  Service: General;  Laterality: N/A;  . HEMORRHOID SURGERY    . HERNIA REPAIR N/A 7/86/7544   Umbilical hernia repair  . INSERTION OF DIALYSIS CATHETER N/A 01/27/2016   Procedure: INSERTION OF DIALYSIS CATHETER;  Surgeon: Waynetta Sandy, MD;  Location: [place];  Service: Vascular;  Laterality: N/A;  . INSERTION OF MESH N/A 07/04/2012   Procedure: INSERTION OF MESH;  Surgeon: Madilyn Hook, DO;  Location: [place];  Service: General;  Laterality: N/A;  . IR EMBO ART  VEN HEMORR LYMPH EXTRAV  INC GUIDE ROADMAPPING  01/13/2018  . IR FLUORO GUIDE CV LINE RIGHT  01/13/2018  . IR IVC FILTER PLMT / S&I /IMG GUID/MOD SED  01/27/2018  . IR RENAL SELECTIVE  UNI INC S&I MOD SED  01/13/2018  . IR US GUIDE VASC ACCESS RIGHT  01/13/2018  . KIDNEY CYST REMOVAL  2019  . LAPAROSCOPIC LYSIS OF ADHESIONS N/A 02/12/2016   Procedure: LAPAROSCOPIC LYSIS OF ADHESIONS;  Surgeon: Jackolyn Confer, MD;  Location: [place];  Service: General;   Laterality: N/A;  . LEFT AND RIGHT HEART CATHETERIZATION WITH CORONARY ANGIOGRAM N/A 08/04/2012   Procedure: LEFT AND RIGHT HEART CATHETERIZATION WITH CORONARY ANGIOGRAM;  Surgeon: Birdie Riddle, MD;  Location: [place] CATH LAB;  Service: Cardiovascular;  Laterality: N/A;  . NEPHRECTOMY  2000   right  . REVISON OF ARTERIOVENOUS FISTULA Left 06/05/2013   Procedure: REVISON OF LEFT RADIOCEPHALIC  ARTERIOVENOUS FISTULA;  Surgeon: Conrad Duncan, MD;  Location: [place];  Service: Vascular;  Laterality: Left;  . REVISON OF ARTERIOVENOUS FISTULA Left 01/27/2016   Procedure: REVISION OF LEFT UPPER EXTREMITY ARTERIOVENOUS FISTULA;  Surgeon: Waynetta Sandy, MD;  Location: [place];  Service: Vascular;  Laterality: Left;  . REVISON OF ARTERIOVENOUS FISTULA Left 08/03/2016   Procedure: REVISON OF Left arm ARTERIOVENOUS FISTULA;  Surgeon: Elam Dutch, MD;  Location: [place];  Service: Vascular;  Laterality: Left;  . REVISON OF ARTERIOVENOUS FISTULA Left 05/06/2017   Procedure: REVISION PLICATION OF ARTERIOVENOUS FISTULA LEFT ARM;  Surgeon: Rosetta Posner, MD;  Location: [place];  Service: Vascular;  Laterality: Left;  . RIGHT HEART CATHETERIZATION N/A 05/21/2011   Procedure: RIGHT HEART  CATH;  Surgeon: Birdie Riddle, MD;  Location: [facility] CATH LAB;  Service: Cardiovascular;  Laterality: N/A;  . smashed  1990's   "left pinky; have a plate in there"  . UMBILICAL HERNIA REPAIR N/A 07/04/2012   Procedure: LAPAROSCOPIC UMBILICAL HERNIA;  Surgeon: Madilyn Hook, DO;  Location: [place];  Service: General;  Laterality: N/A;  . US ECHOCARDIOGRAPHY  05/20/11     A IV Location/Drains/Wounds Patient Lines/Drains/Airways Status   Active Line/Drains/Airways    Name:   Placement date:   Placement time:   Site:   Days:   Peripheral IV 11/22/18 Anterior;Right Forearm   11/22/18    2016    Forearm   57   Peripheral IV 01/18/19 Right Hand   01/18/19    1243    Hand   less than 1   Fistula / Graft Left Arteriovenous fistula   -     -    -      Fistula / Graft Left Forearm Arteriovenous fistula   -    -    Forearm      Fistula / Graft Left Forearm Arteriovenous fistula   01/27/16    0820    Forearm   1087   Fistula / Graft Left Forearm Arteriovenous fistula   08/03/16    0936    Forearm   898   Fistula / Graft Left Forearm Arteriovenous fistula   01/13/18    2057    Forearm   370   Incision (Closed) 02/12/16 Abdomen   02/12/16    1728     1071   Incision (Closed) 08/03/16 Arm Left   08/03/16    0936     898   Incision (Closed) 05/06/17 Arm Left   05/06/17    1659     622   Incision (Closed) 08/15/18 Perineum Other (Comment)   08/15/18    1356     156   Incision - 5 Ports Abdomen Right;Lateral;Upper Right;Lateral;Lower Medial;Upper Umbilicus Left;Lateral   02/12/16    1729     1071   Wound / Incision (Open or Dehisced) 06/27/15 Other (Comment) Arm Left;Lower AVF for HD. Dressing intact.    06/27/15    1530    Arm   1301          Intake/Output Last 24 hours No intake or output data in the 24 hours ending 01/18/19 1642  Labs/Imaging Results for orders placed or performed during the hospital encounter of 01/18/19 (from the past 48 hour(s))  Lactic acid, plasma     Status: Abnormal   Collection Time: 01/18/19 12:50 PM  Result Value Ref Range   Lactic Acid, Venous 2.0 (HH) 0.5 - 1.9 mmol/L    Comment: CRITICAL RESULT CALLED TO, READ BACK BY AND VERIFIED WITH: K.MOON RN 1332 01/18/2019 MCCORMICK K Performed at [facility], [place] [address] [place], [place] [address]   Basic metabolic panel     Status: Abnormal   Collection Time: 01/18/19  1:00 PM  Result Value Ref Range   Sodium 137 135 - 145 mmol/L   Potassium 4.0 3.5 - 5.1 mmol/L   Chloride 94 (L) 98 - 111 mmol/L   CO2 26 22 - 32 mmol/L   Glucose, Bld 84 70 - 99 mg/dL   BUN 34 (H) 6 - 20 mg/dL   Creatinine, Ser 10.12 (H) 0.61 - 1.24 mg/dL   Calcium 9.1 8.9 - 10.3 mg/dL   GFR calc non Af Wyvonnia Lora  5 (L) >60 mL/min   GFR calc Af Amer 6 (L) >60 mL/min   Anion  gap 17 (H) 5 - 15    Comment: Performed at [place] [address], [place], [place] [address]  CBC with Differential     Status: Abnormal   Collection Time: 01/18/19  1:00 PM  Result Value Ref Range   WBC 6.5 4.0 - 10.5 K/uL   RBC 3.50 (L) 4.22 - 5.81 MIL/uL   Hemoglobin 11.2 (L) 13.0 - 17.0 g/dL   HCT 35.0 (L) 39.0 - 52.0 %   MCV 100.0 80.0 - 100.0 fL   MCH 32.0 26.0 - 34.0 pg   MCHC 32.0 30.0 - 36.0 g/dL   RDW 16.8 (H) 11.5 - 15.5 %   Platelets 228 150 - 400 K/uL   nRBC 0.5 (H) 0.0 - 0.2 %   Neutrophils Relative % 64 %   Neutro Abs 4.2 1.7 - 7.7 K/uL   Lymphocytes Relative 19 %   Lymphs Abs 1.2 0.7 - 4.0 K/uL   Monocytes Relative 11 %   Monocytes Absolute 0.7 0.1 - 1.0 K/uL   Eosinophils Relative 4 %   Eosinophils Absolute 0.3 0.0 - 0.5 K/uL   Basophils Relative 1 %   Basophils Absolute 0.0 0.0 - 0.1 K/uL   Immature Granulocytes 1 %   Abs Immature Granulocytes 0.07 0.00 - 0.07 K/uL    Comment: Performed at [facility], [address] [place], [place] [address]  SARS Coronavirus 2 [facility] order, Performed in [facility] hospital lab) Nasopharyngeal Nasopharyngeal Swab     Status: None   Collection Time: 01/18/19  1:11 PM   Specimen: Nasopharyngeal Swab  Result Value Ref Range   SARS Coronavirus 2 NEGATIVE NEGATIVE    Comment: (NOTE) If result is NEGATIVE SARS-CoV-2 target nucleic acids are NOT DETECTED. The SARS-CoV-2 RNA is generally detectable in upper and lower  respiratory specimens during the acute phase of infection. The lowest  concentration of SARS-CoV-2 viral copies this assay can detect is 250  copies / mL. A negative result does not preclude SARS-CoV-2 infection  and should not be used as the sole basis for treatment or other  patient management decisions.  A negative result may occur with  improper specimen collection / handling, submission of specimen other  than nasopharyngeal swab, presence of viral mutation(s) within the  areas  targeted by this assay, and inadequate number of viral copies  (<250 copies / mL). A negative result must be combined with clinical  observations, patient history, and epidemiological information. If result is POSITIVE SARS-CoV-2 target nucleic acids are DETECTED. The SARS-CoV-2 RNA is generally detectable in upper and lower  respiratory specimens dur ing the acute phase of infection.  Positive  results are indicative of active infection with SARS-CoV-2.  Clinical  correlation with patient history and other diagnostic information is  necessary to determine patient infection status.  Positive results do  not rule out bacterial infection or co-infection with other viruses. If result is PRESUMPTIVE POSTIVE SARS-CoV-2 nucleic acids MAY BE PRESENT.   A presumptive positive result was obtained on the submitted specimen  and confirmed on repeat testing.  While 2019 novel coronavirus  (SARS-CoV-2) nucleic acids may be present in the submitted sample  additional confirmatory testing may be necessary for epidemiological  and / or clinical management purposes  to differentiate between  SARS-CoV-2 and other Sarbecovirus currently known to infect humans.  If clinically indicated additional testing with  an alternate test  methodology [phone]) is advised. The SARS-CoV-2 RNA is generally  detectable in upper and lower respiratory sp ecimens during the acute  phase of infection. The expected result is Negative. Fact Sheet for Patients:  StrictlyIdeas.no Fact Sheet for Healthcare Providers: BankingDealers.co.za This test is not yet approved or cleared by the [place] FDA and has been authorized for detection and/or diagnosis of SARS-CoV-2 by FDA under an Emergency Use Authorization (EUA).  This EUA will remain in effect (meaning this test can be used) for the duration of the COVID-19 declaration under Section 564(b)(1) of the Act, 21 U.S.C. section  360bbb-3(b)(1), unless the authorization is terminated or revoked sooner. Performed at [facility], [place] [address], [place], [place] [address]   Lactic acid, plasma     Status: Abnormal   Collection Time: 01/18/19  2:50 PM  Result Value Ref Range   Lactic Acid, Venous 2.5 (HH) 0.5 - 1.9 mmol/L    Comment: CRITICAL VALUE NOTED.  VALUE IS CONSISTENT WITH PREVIOUSLY REPORTED AND CALLED VALUE. Performed at [facility], [place] [address], [place], [place] [address]    Dg Chest Portable 1 View  Result Date: 01/18/2019 CLINICAL DATA:  Weakness, dizziness and hypotension. EXAM: PORTABLE CHEST 1 VIEW COMPARISON:  11/22/2018 and prior radiographs FINDINGS: Cardiomegaly and elevated LEFT hemidiaphragm again noted. There is no evidence of focal airspace disease, pulmonary edema, suspicious pulmonary nodule/mass, pleural effusion, or pneumothorax. No acute bony abnormalities are identified. IMPRESSION: Cardiomegaly without evidence of acute cardiopulmonary disease. Electronically Signed   By: Margarette Canada M.D.   On: 01/18/2019 13:23    Pending Labs Unresulted Labs (From admission, onward)    Start     Ordered   01/19/19 0500  Procalcitonin  Daily,   R     01/18/19 1515   01/18/19 1516  Procalcitonin - Baseline  ONCE - STAT,   STAT     01/18/19 1515   01/18/19 1351  Blood culture (routine x 2)  BLOOD CULTURE X 2,   STAT     01/18/19 1350   Signed and Held  Renal function panel  Once,   R     Signed and Held   Signed and Held  CBC  Once,   R     Signed and Held          Vitals/Pain Today's Vitals   01/18/19 1600 01/18/19 1615 01/18/19 1630 01/18/19 1642  BP: 94/60  (!) 92/54   Pulse: 81 83    Resp: (!) 23 (!) 21 14   Temp:      TempSrc:      SpO2: 100% 97%    Weight:      Height:      PainSc:    0-No pain    Isolation Precautions No active isolations  Medications Medications  Chlorhexidine Gluconate Cloth 2 % PADS 6 each (has no administration in time range)   midodrine (PROAMATINE) tablet 10 mg (has no administration in time range)  doxercalciferol (HECTOROL) injection 6 mcg (has no administration in time range)  sodium chloride 0.9 % bolus 500 mL (0 mLs Intravenous Stopped 01/18/19 1439)  sodium chloride 0.9 % bolus 500 mL (500 mLs Intravenous New Bag/Given 01/18/19 1600)    Mobility walks High fall risk   Focused Assessments Cardiac Assessment Handoff:    Lab Results  Component Value Date   CKTOTAL 136 03/16/2017   CKMB 4.3 (H) 05/20/2011   TROPONINI 0.20 (HH) 01/13/2018  Lab Results  Component Value Date   DDIMER 2.81 (H) 01/05/2015   Does the Patient currently have chest pain? No     R Recommendations: See Admitting Provider Note  Report given to:   Additional Notes:

## 2019-01-18 NOTE — Consult Note (Signed)
Referring Physician: Wynona Stephenson is an 58 y.o. male.                       Chief Complaint: Hypotensive  HPI: 58 years old male presented with dizziness and hypotension. He was hypotensive post dialysis yesterday and feels dizzy with movement. He had diarrhea x 3 days.  He denies GI bleed or hematuria.  He has PMH of CAD, type 2 DM, ESRD, S/P nephrectomy. right one in 2002 and left one in 12/2017 and stroke. He has ischemic cardiomyopathy with LV dysfunction and moderate MR. He has paroxysmal atrial fibrillation.   Past Medical History:  Diagnosis Date  . Acute respiratory failure with hypoxia ([place]) 01/13/2015  . Atrial flutter ([place])   . Bile leak, postoperative 03/15/2016  . Biliary dyskinesia 02/12/2016  . 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 Cardiomyopathy   . CHF (congestive heart failure) ([place])   . Coronary artery disease    normal coronaries by 10/10/08 cath, Cardiac Cath 08-04-12 epic.Dr. Doylene Canard follows  . Diabetes mellitus    pt. states he's borderline diabetic., no longer taking med,- off med. since 2013  . Dialysis patient [facility])    Mon-Wed-Fri([place])- Left AV fistula  . Diverticulitis November 2016   reoccurred in December 2016  . Diverticulitis of intestine without perforation or abscess without bleeding 04/21/2015  . ESRD (end stage renal disease) ([place])   . GERD (gastroesophageal reflux disease)   . Gout   . Hemorrhage of left kidney 01/13/2018  . History of nephrectomy 07/04/2012   History of right nephrectomy in 2002 for renal cell carcinoma   . History of unilateral nephrectomy   . Hypertension   . Low iron   . Myocardial infarction [facility]) ?2006  . Renal cell carcinoma    dialysis- MWF- Dr. Mercy Moore follows.  . Renal insufficiency   . Shortness of breath 05/19/11   "at rest, lying down, w/exertion"  . Stroke [facility]) 02/2011   05/19/11 denies residual  . Umbilical hernia 17/40/81   unrepaired      Past  Surgical History:  Procedure Laterality Date  . ANAL FISTULOTOMY  08/15/2018  . AV FISTULA PLACEMENT  03/29/2011   Procedure: ARTERIOVENOUS (AV) FISTULA CREATION;  Surgeon: Hinda Lenis, MD;  Location: [place];  Service: Vascular;  Laterality: Left;  LEFT RADIOCEPHALIC , Arteriovenous KGYJEHU([id number])  . CARDIAC CATHETERIZATION  05/21/11  . CARDIAC CATHETERIZATION N/A 03/16/2016   Procedure: Left Heart Cath and Coronary Angiography;  Surgeon: Dixie Dials, MD;  Location: [place] CV LAB;  Service: Cardiovascular;  Laterality: N/A;  . CHOLECYSTECTOMY N/A 02/12/2016   Procedure: LAPAROSCOPIC CHOLECYSTECTOMY WITH INTRAOPERATIVE CHOLANGIOGRAM;  Surgeon: Jackolyn Confer, MD;  Location: [place];  Service: General;  Laterality: N/A;  . COLONOSCOPY WITH PROPOFOL N/A 02/01/2017   Procedure: COLONOSCOPY WITH PROPOFOL;  Surgeon: Juanita Craver, MD;  Location[facility];  Service: Endoscopy;  Laterality: N/A;  . dialysis cath placed    . ESOPHAGOGASTRODUODENOSCOPY (EGD) WITH PROPOFOL N/A 12/02/2015   Procedure: ESOPHAGOGASTRODUODENOSCOPY (EGD) WITH PROPOFOL;  Surgeon: Juanita Craver, MD;  Location: [facility];  Service: Endoscopy;  Laterality: N/A;  . EVALUATION UNDER ANESTHESIA WITH HEMORRHOIDECTOMY N/A 08/15/2018   Procedure: HEMORRHOID LIGATION/PEXY;  Surgeon: Michael Boston, MD;  Location: [place];  Service: General;  Laterality: N/A;  . FINGER SURGERY     L pinkie finger- ORIF- /w remaining hardware - 1990's    .  FISTULOTOMY N/A 08/15/2018   Procedure: SUPERFICIAL ANAL FISTULOTOMY;  Surgeon: Michael Boston, MD;  Location: [place];  Service: General;  Laterality: N/A;  . HEMORRHOID SURGERY    . HERNIA REPAIR N/A 1/60/7371   Umbilical hernia repair  . INSERTION OF DIALYSIS CATHETER N/A 01/27/2016   Procedure: INSERTION OF DIALYSIS CATHETER;  Surgeon: Waynetta Sandy, MD;  Location: [place];  Service: Vascular;  Laterality: N/A;  . INSERTION OF MESH N/A 07/04/2012   Procedure: INSERTION OF MESH;   Surgeon: Madilyn Hook, DO;  Location: [place];  Service: General;  Laterality: N/A;  . IR EMBO ART  VEN HEMORR LYMPH EXTRAV  INC GUIDE ROADMAPPING  01/13/2018  . IR FLUORO GUIDE CV LINE RIGHT  01/13/2018  . IR IVC FILTER PLMT / S&I /IMG GUID/MOD SED  01/27/2018  . IR RENAL SELECTIVE  UNI INC S&I MOD SED  01/13/2018  . IR US GUIDE VASC ACCESS RIGHT  01/13/2018  . KIDNEY CYST REMOVAL  2019  . LAPAROSCOPIC LYSIS OF ADHESIONS N/A 02/12/2016   Procedure: LAPAROSCOPIC LYSIS OF ADHESIONS;  Surgeon: Jackolyn Confer, MD;  Location: [place];  Service: General;  Laterality: N/A;  . LEFT AND RIGHT HEART CATHETERIZATION WITH CORONARY ANGIOGRAM N/A 08/04/2012   Procedure: LEFT AND RIGHT HEART CATHETERIZATION WITH CORONARY ANGIOGRAM;  Surgeon: Birdie Riddle, MD;  Location: [place] CATH LAB;  Service: Cardiovascular;  Laterality: N/A;  . NEPHRECTOMY  2000   right  . REVISON OF ARTERIOVENOUS FISTULA Left 06/05/2013   Procedure: REVISON OF LEFT RADIOCEPHALIC  ARTERIOVENOUS FISTULA;  Surgeon: Conrad Bay, MD;  Location: [place];  Service: Vascular;  Laterality: Left;  . REVISON OF ARTERIOVENOUS FISTULA Left 01/27/2016   Procedure: REVISION OF LEFT UPPER EXTREMITY ARTERIOVENOUS FISTULA;  Surgeon: Waynetta Sandy, MD;  Location: [place];  Service: Vascular;  Laterality: Left;  . REVISON OF ARTERIOVENOUS FISTULA Left 08/03/2016   Procedure: REVISON OF Left arm ARTERIOVENOUS FISTULA;  Surgeon: Elam Dutch, MD;  Location: [place];  Service: Vascular;  Laterality: Left;  . REVISON OF ARTERIOVENOUS FISTULA Left 05/06/2017   Procedure: REVISION PLICATION OF ARTERIOVENOUS FISTULA LEFT ARM;  Surgeon: Rosetta Posner, MD;  Location: [place];  Service: Vascular;  Laterality: Left;  . RIGHT HEART CATHETERIZATION N/A 05/21/2011   Procedure: RIGHT HEART CATH;  Surgeon: Birdie Riddle, MD;  Location: [facility] CATH LAB;  Service: Cardiovascular;  Laterality: N/A;  . smashed  1990's   "left pinky; have a plate in there"  . UMBILICAL HERNIA REPAIR  N/A 07/04/2012   Procedure: LAPAROSCOPIC UMBILICAL HERNIA;  Surgeon: Madilyn Hook, DO;  Location: [place];  Service: General;  Laterality: N/A;  . US ECHOCARDIOGRAPHY  05/20/11    Family History  Problem Relation Age of Onset  . Hypertension Mother   . Kidney disease Mother    Social History:  reports that he has never smoked. He has never used smokeless tobacco. He reports that he does not drink alcohol or use drugs.  Allergies:  Allergies  Allergen Reactions  . Ace Inhibitors Itching and Cough    Medications Prior to Admission  Medication Sig Dispense Refill  . allopurinol (ZYLOPRIM) 100 MG tablet Take 1 tablet (100 mg total) by mouth daily. 30 tablet 3  . amiodarone (PACERONE) 200 MG tablet Take 0.5 tablets (100 mg total) by mouth daily. 30 tablet 1  . calcium acetate (PHOSLO) 667 MG capsule Take 1-3 capsules (667-2,001 mg total) by mouth See admin instructions. Take 2001 mg capsules with  meals three times daily and 667 mg capsule with snacks.    . chlorhexidine (PERIDEX) 0.12 % solution 15 mLs by Mouth Rinse route daily as needed (dryness).     . ferric citrate (AURYXIA) 1 GM 210 MG(Fe) tablet Take 210 mg by mouth 3 (three) times daily with meals.     . Lidocaine, Anorectal, (L-M-X 5) 5 % CREA Apply 1 application topically 4 (four) times daily as needed (apply to area up to 6 times daily as needed). (Patient taking differently: Apply 1 application topically See admin instructions. Apply up to six times daily as needed for discomfort) 1 Tube 0  . lidocaine-prilocaine (EMLA) cream Apply 1 application topically See admin instructions. Apply topically to port access prior to dialysis - Monday, Wednesday, Friday  12  . meclizine (ANTIVERT) 25 MG tablet Take 1 tablet (25 mg total) by mouth 3 (three) times daily as needed for dizziness. 30 tablet 0  . midodrine (PROAMATINE) 10 MG tablet 30 mg  on the days of dyalisis three times a week, and only takes 10 mg on off dyalisis days. (Patient taking  differently: Take 10-30 mg by mouth See admin instructions. 30 mg  on the days of dialysis three times a week, and only takes 10 mg on off dialysis days.) 1 tablet 0  . multivitamin (RENA-VIT) TABS tablet Take 1 tablet by mouth at bedtime.    . pantoprazole (PROTONIX) 40 MG tablet Take 40 mg by mouth 2 (two) times daily.    Marland Kitchen warfarin (COUMADIN) 2.5 MG tablet Take 3 tablets (7.5 mg total) by mouth daily at 6 PM.    . nitroGLYCERIN (NITROSTAT) 0.4 MG SL tablet Place 0.4 mg under the tongue every 5 (five) minutes as needed for chest pain.   0  . UNABLE TO FIND Out patient physical therapy- vestibular therapy for BPPV 1 each 0    Results for orders placed or performed during the hospital encounter of 01/18/19 (from the past 48 hour(s))  Lactic acid, plasma     Status: Abnormal   Collection Time: 01/18/19 12:50 PM  Result Value Ref Range   Lactic Acid, Venous 2.0 (HH) 0.5 - 1.9 mmol/L    Comment: CRITICAL RESULT CALLED TO, READ BACK BY AND VERIFIED WITH: K.MOON RN [id number] 01/18/2019 MCCORMICK K Performed at [facility], [place] [address], [place], [place] [address]   Basic metabolic panel     Status: Abnormal   Collection Time: 01/18/19  1:00 PM  Result Value Ref Range   Sodium 137 135 - 145 mmol/L   Potassium 4.0 3.5 - 5.1 mmol/L   Chloride 94 (L) 98 - 111 mmol/L   CO2 26 22 - 32 mmol/L   Glucose, Bld 84 70 - 99 mg/dL   BUN 34 (H) 6 - 20 mg/dL   Creatinine, Ser 10.12 (H) 0.61 - 1.24 mg/dL   Calcium 9.1 8.9 - 10.3 mg/dL   GFR calc non Af Amer 5 (L) >60 mL/min   GFR calc Af Amer 6 (L) >60 mL/min   Anion gap 17 (H) 5 - 15    Comment: Performed at [place] [address], [place], [place] [address]  CBC with Differential     Status: Abnormal   Collection Time: 01/18/19  1:00 PM  Result Value Ref Range   WBC 6.5 4.0 - 10.5 K/uL   RBC 3.50 (L) 4.22 - 5.81 MIL/uL   Hemoglobin 11.2 (L) 13.0 - 17.0 g/dL   HCT 35.0 (L)  39.0 - 52.0 %   MCV 100.0 80.0 - 100.0 fL   MCH 32.0 26.0 -  34.0 pg   MCHC 32.0 30.0 - 36.0 g/dL   RDW 16.8 (H) 11.5 - 15.5 %   Platelets 228 150 - 400 K/uL   nRBC 0.5 (H) 0.0 - 0.2 %   Neutrophils Relative % 64 %   Neutro Abs 4.2 1.7 - 7.7 K/uL   Lymphocytes Relative 19 %   Lymphs Abs 1.2 0.7 - 4.0 K/uL   Monocytes Relative 11 %   Monocytes Absolute 0.7 0.1 - 1.0 K/uL   Eosinophils Relative 4 %   Eosinophils Absolute 0.3 0.0 - 0.5 K/uL   Basophils Relative 1 %   Basophils Absolute 0.0 0.0 - 0.1 K/uL   Immature Granulocytes 1 %   Abs Immature Granulocytes 0.07 0.00 - 0.07 K/uL    Comment: Performed at [place] [address] [place], [place] [address]  Procalcitonin - Baseline     Status: None   Collection Time: 01/18/19  1:00 PM  Result Value Ref Range   Procalcitonin 1.04 ng/mL    Comment:        Interpretation: PCT > 0.5 ng/mL and <= 2 ng/mL: Systemic infection (sepsis) is possible, but other conditions are known to elevate PCT as well. (NOTE)       Sepsis PCT Algorithm           Lower Respiratory Tract                                      Infection PCT Algorithm    ----------------------------     ----------------------------         PCT < 0.25 ng/mL                PCT < 0.10 ng/mL         Strongly encourage             Strongly discourage   discontinuation of antibiotics    initiation of antibiotics    ----------------------------     -----------------------------       PCT 0.25 - 0.50 ng/mL            PCT 0.10 - 0.25 ng/mL               OR       >80% decrease in PCT            Discourage initiation of                                            antibiotics      Encourage discontinuation           of antibiotics    ----------------------------     -----------------------------         PCT >= 0.50 ng/mL              PCT 0.26 - 0.50 ng/mL                AND       <80% decrease in PCT             Encourage initiation of  antibiotics       Encourage continuation            of antibiotics    ----------------------------     -----------------------------        PCT >= 0.50 ng/mL                  PCT > 0.50 ng/mL               AND         increase in PCT                  Strongly encourage                                      initiation of antibiotics    Strongly encourage escalation           of antibiotics                                     -----------------------------                                           PCT <= 0.25 ng/mL                                                 OR                                        > 80% decrease in PCT                                     Discontinue / Do not initiate                                             antibiotics Performed at [facility], [address], [place], [place] [address]   SARS Coronavirus 2 [facility] order, Performed in [facility] hospital lab) Nasopharyngeal Nasopharyngeal Swab     Status: None   Collection Time: 01/18/19  1:11 PM   Specimen: Nasopharyngeal Swab  Result Value Ref Range   SARS Coronavirus 2 NEGATIVE NEGATIVE    Comment: (NOTE) If result is NEGATIVE SARS-CoV-2 target nucleic acids are NOT DETECTED. The SARS-CoV-2 RNA is generally detectable in upper and lower  respiratory specimens during the acute phase of infection. The lowest  concentration of SARS-CoV-2 viral copies this assay can detect is 250  copies / mL. A negative result does not preclude SARS-CoV-2 infection  and should not be used as the sole basis for treatment or other  patient management decisions.  A negative result may occur with  improper specimen collection / handling, submission of specimen other  than nasopharyngeal swab, presence of viral mutation(s) within the  areas targeted by this assay,  and inadequate number of viral copies  (<250 copies / mL). A negative result must be combined with clinical  observations, patient history, and epidemiological information. If result is POSITIVE SARS-CoV-2  target nucleic acids are DETECTED. The SARS-CoV-2 RNA is generally detectable in upper and lower  respiratory specimens dur ing the acute phase of infection.  Positive  results are indicative of active infection with SARS-CoV-2.  Clinical  correlation with patient history and other diagnostic information is  necessary to determine patient infection status.  Positive results do  not rule out bacterial infection or co-infection with other viruses. If result is PRESUMPTIVE POSTIVE SARS-CoV-2 nucleic acids MAY BE PRESENT.   A presumptive positive result was obtained on the submitted specimen  and confirmed on repeat testing.  While 2019 novel coronavirus  (SARS-CoV-2) nucleic acids may be present in the submitted sample  additional confirmatory testing may be necessary for epidemiological  and / or clinical management purposes  to differentiate between  SARS-CoV-2 and other Sarbecovirus currently known to infect humans.  If clinically indicated additional testing with an alternate test  methodology [phone]) is advised. The SARS-CoV-2 RNA is generally  detectable in upper and lower respiratory sp ecimens during the acute  phase of infection. The expected result is Negative. Fact Sheet for Patients:  StrictlyIdeas.no Fact Sheet for Healthcare Providers: BankingDealers.co.za This test is not yet approved or cleared by the [place] FDA and has been authorized for detection and/or diagnosis of SARS-CoV-2 by FDA under an Emergency Use Authorization (EUA).  This EUA will remain in effect (meaning this test can be used) for the duration of the COVID-19 declaration under Section 564(b)(1) of the Act, 21 U.S.C. section 360bbb-3(b)(1), unless the authorization is terminated or revoked sooner. Performed at [facility], [place] [address], [place], [place] [address]   Lactic acid, plasma     Status: Abnormal   Collection Time: 01/18/19  2:50  PM  Result Value Ref Range   Lactic Acid, Venous 2.5 (HH) 0.5 - 1.9 mmol/L    Comment: CRITICAL VALUE NOTED.  VALUE IS CONSISTENT WITH PREVIOUSLY REPORTED AND CALLED VALUE. Performed at [facility], [place] [address], [place], [place] [address]    Dg Chest Portable 1 View  Result Date: 01/18/2019 CLINICAL DATA:  Weakness, dizziness and hypotension. EXAM: PORTABLE CHEST 1 VIEW COMPARISON:  11/22/2018 and prior radiographs FINDINGS: Cardiomegaly and elevated LEFT hemidiaphragm again noted. There is no evidence of focal airspace disease, pulmonary edema, suspicious pulmonary nodule/mass, pleural effusion, or pneumothorax. No acute bony abnormalities are identified. IMPRESSION: Cardiomegaly without evidence of acute cardiopulmonary disease. Electronically Signed   By: Margarette Canada M.D.   On: 01/18/2019 13:23    Review Of Systems Constitutional: No fever, chills, weight loss or gain. Eyes: No vision change, wears glasses. No discharge or pain. Ears: No hearing loss, No tinnitus. Respiratory: No asthma, COPD, pneumonias. Positive shortness of breath. No hemoptysis. Cardiovascular: Positive chest pain, palpitation, no leg edema. Gastrointestinal: No nausea, vomiting, diarrhea, constipation. No GI bleed. No hepatitis. Genitourinary: No dysuria, hematuria, kidney stone. No incontinance. Neurological: No headache, stroke, seizures.  Psychiatry: No psych facility admission for anxiety, depression, suicide. No detox. Skin: No rash. Musculoskeletal: Positive joint pain, fibromyalgia. No neck pain, back pain. Lymphadenopathy: No lymphadenopathy. Hematology: Positive anemia or easy bruising.   Blood pressure (!) 92/54, pulse 83, temperature 98 F (36.7 C), temperature source Oral, resp. rate 14, height 6\' 1"  (1.854 m), weight 111.1 kg, SpO2 97 %. Body mass  index is 32.32 kg/m. General appearance: alert, cooperative, appears stated age and no distress Head: Normocephalic, atraumatic. Eyes: Brown  eyes, pink conjunctiva, corneas clear. PERRL, EOM's intact. Neck: No adenopathy, no carotid bruit, no JVD, supple, symmetrical, trachea midline and thyroid not enlarged. Resp: Clear to auscultation bilaterally. Cardio: Regular rate and rhythm, S1, S2 normal, III/VI systolic murmur, no click, rub or gallop GI: Soft, non-tender; bowel sounds normal; no organomegaly. Extremities: No edema, cyanosis or clubbing. Left forearm AV fistula. Skin: Warm and dry.  Neurologic: Alert and oriented X 3, normal strength. Normal coordination and gait.  Assessment/Plan Dizziness Hypotension ESRD Type 2 DM Anemia of chronic disease Dilated cardiomyopathy Moderate MR Obesity Paroxysmal atrial fibrillation  Agree with IV fluids as needed for hypotension. Renal consult appreciated. Hold warfarin till PT/INR result available.  Time spent: Review of old records, Lab, x-rays, EKG, other cardiac tests, examination, discussion with patient over 60 minutes.  Birdie Riddle, MD  01/18/2019, 5:10 PM

## 2019-01-18 NOTE — Consult Note (Signed)
Hospital Consult    Reason for Consult: Evaluate scab on left arm AV fistula Referring Physician: Neurology MRN #:  [id number]  History of Present Illness: This is a 58 y.o. male with multiple medical problems including end-stage renal disease, type 2 diabetes, A. fib on Coumadin that presented to the ED with generalized weakness and hypotension for 3 days.  Patient states his blood pressures been as low as the 08X systolic at home and he does take midodrine.  No fevers or chills.  No respiratory symptoms.  He was evaluated by nephrology and they noted a scab on his left AV fistula and asked vascular surgery to evaluate.  Patient states the scab has been there for at least a month or 2.  He has had no bleeding event.  He thinks it was placed 8 years ago or so.  Has been plicated multiple times for aneurysms by Dr. Lawana Chambers, Early , and Fields.  Past Medical History:  Diagnosis Date  . Acute respiratory failure with hypoxia ([place]) 01/13/2015  . Atrial flutter ([place])   . Bile leak, postoperative 03/15/2016  . Biliary dyskinesia 02/12/2016  . 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 Cardiomyopathy   . CHF (congestive heart failure) ([place])   . Coronary artery disease    normal coronaries by 10/10/08 cath, Cardiac Cath 08-04-12 epic.Dr. Doylene Canard follows  . Diabetes mellitus    pt. states he's borderline diabetic., no longer taking med,- off med. since 2013  . Dialysis patient [facility])    Mon-Wed-Fri([place])- Left AV fistula  . Diverticulitis November 2016   reoccurred in December 2016  . Diverticulitis of intestine without perforation or abscess without bleeding 04/21/2015  . ESRD (end stage renal disease) ([place])   . GERD (gastroesophageal reflux disease)   . Gout   . Hemorrhage of left kidney 01/13/2018  . History of nephrectomy 07/04/2012   History of right nephrectomy in 2002 for renal cell carcinoma   . History of unilateral nephrectomy   . Hypertension   .  Low iron   . Myocardial infarction [facility]) ?2006  . Renal cell carcinoma    dialysis- MWF- Dr. Mercy Moore follows.  . Renal insufficiency   . Shortness of breath 05/19/11   "at rest, lying down, w/exertion"  . Stroke [facility]) 02/2011   05/19/11 denies residual  . Umbilical hernia 44/81/85   unrepaired    Past Surgical History:  Procedure Laterality Date  . ANAL FISTULOTOMY  08/15/2018  . AV FISTULA PLACEMENT  03/29/2011   Procedure: ARTERIOVENOUS (AV) FISTULA CREATION;  Surgeon: Hinda Lenis, MD;  Location: [place];  Service: Vascular;  Laterality: Left;  LEFT RADIOCEPHALIC , Arteriovenous [id number])  . CARDIAC CATHETERIZATION  05/21/11  . CARDIAC CATHETERIZATION N/A 03/16/2016   Procedure: Left Heart Cath and Coronary Angiography;  Surgeon: Dixie Dials, MD;  Location: [place] CV LAB;  Service: Cardiovascular;  Laterality: N/A;  . CHOLECYSTECTOMY N/A 02/12/2016   Procedure: LAPAROSCOPIC CHOLECYSTECTOMY WITH INTRAOPERATIVE CHOLANGIOGRAM;  Surgeon: Jackolyn Confer, MD;  Location: [place];  Service: General;  Laterality: N/A;  . COLONOSCOPY WITH PROPOFOL N/A 02/01/2017   Procedure: COLONOSCOPY WITH PROPOFOL;  Surgeon: Juanita Craver, MD;  Location: [facility];  Service: Endoscopy;  Laterality: N/A;  . dialysis cath placed    . ESOPHAGOGASTRODUODENOSCOPY (EGD) WITH PROPOFOL N/A 12/02/2015   Procedure: ESOPHAGOGASTRODUODENOSCOPY (EGD) WITH PROPOFOL;  Surgeon: Juanita Craver, MD;  Location: [facility];  Service: Endoscopy;  Laterality: N/A;  .  EVALUATION UNDER ANESTHESIA WITH HEMORRHOIDECTOMY N/A 08/15/2018   Procedure: HEMORRHOID LIGATION/PEXY;  Surgeon: Michael Boston, MD;  Location: [place];  Service: General;  Laterality: N/A;  . FINGER SURGERY     L pinkie finger- ORIF- /w remaining hardware - 1990's    . FISTULOTOMY N/A 08/15/2018   Procedure: SUPERFICIAL ANAL FISTULOTOMY;  Surgeon: Michael Boston, MD;  Location: [place];  Service: General;  Laterality: N/A;  . HEMORRHOID SURGERY    .  HERNIA REPAIR N/A 9/32/3557   Umbilical hernia repair  . INSERTION OF DIALYSIS CATHETER N/A 01/27/2016   Procedure: INSERTION OF DIALYSIS CATHETER;  Surgeon: Waynetta Sandy, MD;  Location: [place];  Service: Vascular;  Laterality: N/A;  . INSERTION OF MESH N/A 07/04/2012   Procedure: INSERTION OF MESH;  Surgeon: Madilyn Hook, DO;  Location: [place];  Service: General;  Laterality: N/A;  . IR EMBO ART  VEN HEMORR LYMPH EXTRAV  INC GUIDE ROADMAPPING  01/13/2018  . IR FLUORO GUIDE CV LINE RIGHT  01/13/2018  . IR IVC FILTER PLMT / S&I /IMG GUID/MOD SED  01/27/2018  . IR RENAL SELECTIVE  UNI INC S&I MOD SED  01/13/2018  . IR US GUIDE VASC ACCESS RIGHT  01/13/2018  . KIDNEY CYST REMOVAL  2019  . LAPAROSCOPIC LYSIS OF ADHESIONS N/A 02/12/2016   Procedure: LAPAROSCOPIC LYSIS OF ADHESIONS;  Surgeon: Jackolyn Confer, MD;  Location: [place];  Service: General;  Laterality: N/A;  . LEFT AND RIGHT HEART CATHETERIZATION WITH CORONARY ANGIOGRAM N/A 08/04/2012   Procedure: LEFT AND RIGHT HEART CATHETERIZATION WITH CORONARY ANGIOGRAM;  Surgeon: Birdie Riddle, MD;  Location: [place] CATH LAB;  Service: Cardiovascular;  Laterality: N/A;  . NEPHRECTOMY  2000   right  . REVISON OF ARTERIOVENOUS FISTULA Left 06/05/2013   Procedure: REVISON OF LEFT RADIOCEPHALIC  ARTERIOVENOUS FISTULA;  Surgeon: Conrad Marseilles, MD;  Location: [place];  Service: Vascular;  Laterality: Left;  . REVISON OF ARTERIOVENOUS FISTULA Left 01/27/2016   Procedure: REVISION OF LEFT UPPER EXTREMITY ARTERIOVENOUS FISTULA;  Surgeon: Waynetta Sandy, MD;  Location: [place];  Service: Vascular;  Laterality: Left;  . REVISON OF ARTERIOVENOUS FISTULA Left 08/03/2016   Procedure: REVISON OF Left arm ARTERIOVENOUS FISTULA;  Surgeon: Elam Dutch, MD;  Location: [place];  Service: Vascular;  Laterality: Left;  . REVISON OF ARTERIOVENOUS FISTULA Left 05/06/2017   Procedure: REVISION PLICATION OF ARTERIOVENOUS FISTULA LEFT ARM;  Surgeon: Rosetta Posner, MD;   Location: [place];  Service: Vascular;  Laterality: Left;  . RIGHT HEART CATHETERIZATION N/A 05/21/2011   Procedure: RIGHT HEART CATH;  Surgeon: Birdie Riddle, MD;  Location: [facility] CATH LAB;  Service: Cardiovascular;  Laterality: N/A;  . smashed  1990's   "left pinky; have a plate in there"  . UMBILICAL HERNIA REPAIR N/A 07/04/2012   Procedure: LAPAROSCOPIC UMBILICAL HERNIA;  Surgeon: Madilyn Hook, DO;  Location: [place];  Service: General;  Laterality: N/A;  . US ECHOCARDIOGRAPHY  05/20/11    Allergies  Allergen Reactions  . Ace Inhibitors Itching and Cough    Prior to Admission medications   Medication Sig Start Date End Date Taking? Authorizing Provider  allopurinol (ZYLOPRIM) 100 MG tablet Take 1 tablet (100 mg total) by mouth daily. 11/24/18  Yes Dixie Dials, MD  amiodarone (PACERONE) 200 MG tablet Take 0.5 tablets (100 mg total) by mouth daily. 07/21/17  Yes Regalado, Belkys A, MD  calcium acetate (PHOSLO) 667 MG capsule Take 1-3 capsules (667-2,001 mg total) by mouth  See admin instructions. Take 2001 mg capsules with meals three times daily and 667 mg capsule with snacks. 11/23/18  Yes Dixie Dials, MD  chlorhexidine (PERIDEX) 0.12 % solution 15 mLs by Mouth Rinse route daily as needed (dryness).  05/29/18  Yes [provider]  ferric citrate (AURYXIA) 1 GM 210 MG(Fe) tablet Take 210 mg by mouth 3 (three) times daily with meals.    Yes [provider]  Lidocaine, Anorectal, (L-M-X 5) 5 % CREA Apply 1 application topically 4 (four) times daily as needed (apply to area up to 6 times daily as needed). Patient taking differently: Apply 1 application topically See admin instructions. Apply up to six times daily as needed for discomfort 07/09/18  Yes Gareth Morgan, MD  lidocaine-prilocaine (EMLA) cream Apply 1 application topically See admin instructions. Apply topically to port access prior to dialysis - Monday, Wednesday, Friday 04/08/15  Yes [provider]  meclizine  (ANTIVERT) 25 MG tablet Take 1 tablet (25 mg total) by mouth 3 (three) times daily as needed for dizziness. 08/01/18  Yes Debbe Odea, MD  midodrine (PROAMATINE) 10 MG tablet 30 mg  on the days of dyalisis three times a week, and only takes 10 mg on off dyalisis days. Patient taking differently: Take 10-30 mg by mouth See admin instructions. 30 mg  on the days of dialysis three times a week, and only takes 10 mg on off dialysis days. 07/18/18  Yes Rodriguez-Southworth, Sunday Spillers, PA-C  multivitamin (RENA-VIT) TABS tablet Take 1 tablet by mouth at bedtime.   Yes [provider]  pantoprazole (PROTONIX) 40 MG tablet Take 40 mg by mouth 2 (two) times daily.   Yes [provider]  warfarin (COUMADIN) 2.5 MG tablet Take 3 tablets (7.5 mg total) by mouth daily at 6 PM. 11/23/18  Yes Dixie Dials, MD  nitroGLYCERIN (NITROSTAT) 0.4 MG SL tablet Place 0.4 mg under the tongue every 5 (five) minutes as needed for chest pain.  05/20/15   [provider]  oxyCODONE-acetaminophen (PERCOCET/ROXICET) 5-325 MG tablet Take 2 tablets by mouth every 6 (six) hours as needed for severe pain. Patient not taking: Reported on 01/18/2019 11/16/18   Hayden Rasmussen, MD  UNABLE TO FIND Out patient physical therapy- vestibular therapy for BPPV 08/01/18   Debbe Odea, MD    Social History   Socioeconomic History  . Marital status: Married    Spouse name: Not on file  . Number of children: Not on file  . Years of education: Not on file  . Highest education level: Not on file  Occupational History  . Occupation: disability  Social Needs  . Financial resource strain: Not hard at all  . Food insecurity    Worry: Never true    Inability: Never true  . Transportation needs    Medical: No    Non-medical: No  Tobacco Use  . Smoking status: Never Smoker  . Smokeless tobacco: Never Used  Substance and Sexual Activity  . Alcohol use: No    Alcohol/week: 0.0 standard drinks  . Drug use: No     Types: Cocaine    Comment: Past hx-none in 20 yrs  . Sexual activity: Yes  Lifestyle  . Physical activity    Days per week: 3 days    Minutes per session: 150+ min  . Stress: Not at all  Relationships  . Social Herbalist on phone: Not on file    Gets together: Not on file  Attends religious service: Not on file    Active member of club or organization: Not on file    Attends meetings of clubs or organizations: Not on file    Relationship status: Not on file  . Intimate partner violence    Fear of current or ex partner: No    Emotionally abused: No    Physically abused: No    Forced sexual activity: No  Other Topics Concern  . Not on file  Social History Narrative  . Not on file     Family History  Problem Relation Age of Onset  . Hypertension Mother   . Kidney disease Mother     ROS: [x]  Positive   [ ]  Negative   [ ]  All sytems reviewed and are negative  Cardiovascular: []  chest pain/pressure []  palpitations []  SOB lying flat []  DOE []  pain in legs while walking []  pain in legs at rest []  pain in legs at night []  non-healing ulcers []  hx of DVT []  swelling in legs  Pulmonary: []  productive cough []  asthma/wheezing []  home O2  Neurologic: []  weakness in []  arms []  legs []  numbness in []  arms []  legs []  hx of CVA []  mini stroke [] difficulty speaking or slurred speech []  temporary loss of vision in one eye []  dizziness  Hematologic: []  hx of cancer []  bleeding problems []  problems with blood clotting easily  Endocrine:   []  diabetes []  thyroid disease  GI []  vomiting blood []  blood in stool  GU: []  CKD/renal failure []  HD--[]  M/W/F or []  T/T/S []  burning with urination []  blood in urine  Psychiatric: []  anxiety []  depression  Musculoskeletal: []  arthritis []  joint pain  Integumentary: []  rashes []  ulcers  Constitutional: []  fever []  chills   Physical Examination  Vitals:   01/18/19 1430 01/18/19 1515  BP: (!)  90/57 (!) 88/58  Pulse:  79  Resp: 14 13  Temp:    SpO2:  98%   Body mass index is 32.32 kg/m.  General:  WDWN in NAD Gait: Not observed HENT: WNL, normocephalic Pulmonary: normal non-labored breathing, without Rales, rhonchi,  wheezing Abdomen: soft, NT/ND, no masses Vascular Exam/Pulses: Left radial pulse 1+ Thrill in left arm AVF Two aneurysms as pictured below.  One with scab.  No active bleeding. Musculoskeletal: no muscle wasting or atrophy  Neurologic: A&O X 3; Appropriate Affect ; SENSATION: normal; MOTOR FUNCTION:  moving all extremities equally. Speech is fluent/normal      CBC    Component Value Date/Time   WBC 6.5 01/18/2019 1300   RBC 3.50 (L) 01/18/2019 1300   HGB 11.2 (L) 01/18/2019 1300   HGB 12.0 (L) 11/09/2018 0936   HCT 35.0 (L) 01/18/2019 1300   HCT 35.0 (L) 11/09/2018 0936   PLT 228 01/18/2019 1300   PLT 237 11/09/2018 0936   MCV 100.0 01/18/2019 1300   MCV 91 11/09/2018 0936   MCH 32.0 01/18/2019 1300   MCHC 32.0 01/18/2019 1300   RDW 16.8 (H) 01/18/2019 1300   RDW 15.4 11/09/2018 0936   LYMPHSABS 1.2 01/18/2019 1300   MONOABS 0.7 01/18/2019 1300   EOSABS 0.3 01/18/2019 1300   BASOSABS 0.0 01/18/2019 1300    BMET    Component Value Date/Time   NA 137 01/18/2019 1300   K 4.0 01/18/2019 1300   CL 94 (L) 01/18/2019 1300   CO2 26 01/18/2019 1300   GLUCOSE 84 01/18/2019 1300   BUN 34 (H) 01/18/2019 1300   CREATININE 10.12 (H) 01/18/2019  1300   CALCIUM 9.1 01/18/2019 1300   GFRNONAA 5 (L) 01/18/2019 1300   GFRAA 6 (L) 01/18/2019 1300    COAGS: Lab Results  Component Value Date   INR 2.9 (A) 12/27/2018   INR 2.0 (A) 12/20/2018   INR 1.8 (A) 12/13/2018   PROTIME 20.6 (A) 12/14/2018   PROTIME 26.6 (A) 11/22/2018   PROTIME 20.3 (A) 09/06/2018     Non-Invasive Vascular Imaging:    None   ASSESSMENT/PLAN: This is a 58 y.o. male with multiple medical problems including end-stage renal disease, type 2 diabetes, A. fib on Coumadin  that presented to the ED with generalized weakness and hypotension for 3 days.  Patient states his blood pressures been as low as the 60s at home and he does take midodrine.  He was evaluated by nephrology and they noted a scab on his left AV fistula   There is no OR availability tomorrow or Monday.  Given he has had no bleeding events, I think this can be arranged as an outpatient.  I will schedule with me on a Thursday which is a nondialysis day.  My office will reach out to him to schedule.  They should access his fistula way from the scab for now.  [name], MD [facility] Office: [phone] Pager: [phone]

## 2019-01-18 NOTE — H&P (Signed)
History and Physical    NAHOME BUBLITZ TDD:[id number] DOB: April 03, 1961 DOA: 01/18/2019  PCP: Shelby Mattocks, PA-C  Patient coming from: 37 office  I have personally briefly reviewed patient's old medical records in [place]  Chief Complaint: Dizziness and hypotension sent in via [facility]  HPI: [name] is a 58 y.o. 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  He has been having dizziness and hypotension worse since Monday.  He had to come off dialysis 30 minutes early yesterday because his blood pressure was too low.  He complains of dizziness with movement and feels a little short of breath.  He has had diarrhea since Monday.  He always has diarrhea but he is having more frequent episodes he is up to 3-4 bowel movements a day increased from his usual 1-2 bowel movements daily.  His systolic blood pressure usually runs in the low 90s and 80s but currently is in the 70s and he feels quite poorly.  Has not tried anything for his symptoms.  He states his dizziness resolves with rest.  He has some dyspnea on exertion but no associated shortness of breath at rest.  He has no fever, headache, cough, chest pain, numbness or tingling of the extremities or abdominal pain.  ED Course: In the ED lab work showed anemia improved from baseline, chronic renal insufficiency.  Initial lactate level was 2 but there is a low suspicion for acute infectious  etiology or sepsis.  Chest x-ray is reassuring with no evidence of effusion or infiltrate.  He does not produce urine and therefore urine studies were not performed.  Blood cultures were obtained.  He appeared to be somewhat dry and was given a 500 mL fluid bolus in the emergency department.  His COVID test was negative.  He was referred to me for further evaluation and management due to his dizziness.  Review of Systems: As per HPI otherwise all other systems reviewed and  negative.   Past Medical History:  Diagnosis Date   Acute respiratory failure with hypoxia ([place]) 01/13/2015   Atrial flutter (HCC)    Bile leak, postoperative 03/15/2016   Biliary dyskinesia 02/12/2016   Blind right eye    Hx: of partial blindness in right eye   Cardiomyopathy    CHF (congestive heart failure) ([place])    Coronary artery disease    normal coronaries by 10/10/08 cath, Cardiac Cath 08-04-12 epic.Dr. Doylene Canard follows   Diabetes mellitus    pt. states he's borderline diabetic., no longer taking med,- off med. since 2013   Dialysis patient [facility])    Mon-Wed-Fri([place])- Left AV fistula   Diverticulitis November 2016   reoccurred in December 2016   Diverticulitis of intestine without perforation or abscess without bleeding 04/21/2015   ESRD (end stage renal disease) ([place])    GERD (gastroesophageal reflux disease)    Gout    Hemorrhage of left kidney 01/13/2018   History of nephrectomy 07/04/2012   History of right nephrectomy in 2002 for renal cell carcinoma    History of  unilateral nephrectomy    Hypertension    Low iron    Myocardial infarction [facility]) ?2006   Renal cell carcinoma    dialysis- MWF- Dr. Mercy Moore follows.   Renal insufficiency    Shortness of breath 05/19/11   "at rest, lying down, w/exertion"   Stroke [facility]) 02/2011   05/19/11 denies residual   Umbilical hernia 14/97/02   unrepaired    Past Surgical History:  Procedure Laterality Date   ANAL  FISTULOTOMY  08/15/2018   AV FISTULA PLACEMENT  03/29/2011   Procedure: ARTERIOVENOUS (AV) FISTULA CREATION;  Surgeon: Hinda Lenis, MD;  Location: [place];  Service: Vascular;  Laterality: Left;  LEFT RADIOCEPHALIC , Arteriovenous OVZCHYI([id number])   CARDIAC CATHETERIZATION  05/21/11   CARDIAC CATHETERIZATION N/A 03/16/2016   Procedure: Left Heart Cath and Coronary Angiography;  Surgeon: Dixie Dials, MD;  Location: [place] CV LAB;  Service: Cardiovascular;  Laterality: N/A;   CHOLECYSTECTOMY N/A 02/12/2016   Procedure: LAPAROSCOPIC CHOLECYSTECTOMY WITH INTRAOPERATIVE CHOLANGIOGRAM;  Surgeon: Jackolyn Confer, MD;  Location: [place];  Service: General;  Laterality: N/A;   COLONOSCOPY WITH PROPOFOL N/A 02/01/2017   Procedure: COLONOSCOPY WITH PROPOFOL;  Surgeon: Juanita Craver, MD;  Location: [facility];  Service: Endoscopy;  Laterality: N/A;   dialysis cath placed     ESOPHAGOGASTRODUODENOSCOPY (EGD) WITH PROPOFOL N/A 12/02/2015   Procedure: ESOPHAGOGASTRODUODENOSCOPY (EGD) WITH PROPOFOL;  Surgeon: Juanita Craver, MD;  Location: [facility];  Service: Endoscopy;  Laterality: N/A;   EVALUATION UNDER ANESTHESIA WITH HEMORRHOIDECTOMY N/A 08/15/2018   Procedure: HEMORRHOID LIGATION/PEXY;  Surgeon: Michael Boston, MD;  Location: [place];  Service: General;  Laterality: N/A;   FINGER SURGERY     L pinkie finger- ORIF- /w remaining hardware - 1990's     FISTULOTOMY N/A 08/15/2018   Procedure: SUPERFICIAL ANAL FISTULOTOMY;  Surgeon: Michael Boston, MD;  Location: [place];  Service: General;  Laterality: N/A;   HEMORRHOID SURGERY     HERNIA REPAIR N/A 09/02/8784   Umbilical hernia repair   INSERTION OF DIALYSIS CATHETER N/A 01/27/2016   Procedure: INSERTION OF DIALYSIS CATHETER;  Surgeon: Waynetta Sandy, MD;  Location: [place];  Service: Vascular;  Laterality: N/A;   INSERTION OF MESH N/A 07/04/2012   Procedure: INSERTION OF MESH;  Surgeon: Madilyn Hook, DO;  Location: [place];  Service:  General;  Laterality: N/A;   IR EMBO ART  VEN HEMORR LYMPH EXTRAV  INC GUIDE ROADMAPPING  01/13/2018   IR FLUORO GUIDE CV LINE RIGHT  01/13/2018   IR IVC FILTER PLMT / S&I /IMG GUID/MOD SED  01/27/2018   IR RENAL SELECTIVE  UNI INC S&I MOD SED  01/13/2018   IR US GUIDE VASC ACCESS RIGHT  01/13/2018   KIDNEY CYST REMOVAL  2019   LAPAROSCOPIC LYSIS OF ADHESIONS N/A 02/12/2016   Procedure: LAPAROSCOPIC LYSIS OF ADHESIONS;  Surgeon: Jackolyn Confer, MD;  Location: [place];  Service: General;  Laterality: N/A;   LEFT AND RIGHT HEART CATHETERIZATION WITH CORONARY ANGIOGRAM N/A 08/04/2012   Procedure: LEFT AND RIGHT HEART CATHETERIZATION WITH CORONARY ANGIOGRAM;  Surgeon: Birdie Riddle, MD;  Location: [place] CATH LAB;  Service: Cardiovascular;  Laterality: N/A;   NEPHRECTOMY  2000   right   REVISON OF ARTERIOVENOUS FISTULA Left 06/05/2013   Procedure: REVISON OF LEFT RADIOCEPHALIC  ARTERIOVENOUS FISTULA;  Surgeon: Conrad Mingus, MD;  Location: [place];  Service: Vascular;  Laterality: Left;   REVISON OF ARTERIOVENOUS FISTULA Left 01/27/2016   Procedure: REVISION OF LEFT UPPER EXTREMITY ARTERIOVENOUS  FISTULA;  Surgeon: Waynetta Sandy, MD;  Location: [place];  Service: Vascular;  Laterality: Left;   REVISON OF ARTERIOVENOUS FISTULA Left 08/03/2016   Procedure: REVISON OF Left arm ARTERIOVENOUS FISTULA;  Surgeon: Elam Dutch, MD;  Location: [facility] OR;  Service: Vascular;  Laterality: Left;   REVISON OF ARTERIOVENOUS FISTULA Left 05/06/2017   Procedure: REVISION PLICATION OF ARTERIOVENOUS FISTULA LEFT ARM;  Surgeon: Rosetta Posner, MD;  Location: [place];  Service: Vascular;  Laterality: Left;   RIGHT HEART CATHETERIZATION N/A 05/21/2011   Procedure: RIGHT HEART CATH;  Surgeon: Birdie Riddle, MD;  Location: [place] CATH LAB;  Service: Cardiovascular;  Laterality: N/A;   smashed  1990's   "left pinky; have a plate in there"   UMBILICAL HERNIA REPAIR N/A 07/04/2012   Procedure: LAPAROSCOPIC UMBILICAL  HERNIA;  Surgeon: Madilyn Hook, DO;  Location: [place];  Service: General;  Laterality: N/A;   US ECHOCARDIOGRAPHY  05/20/11    Social History   Social History Narrative   Not on file     reports that he has never smoked. He has never used smokeless tobacco. He reports that he does not drink alcohol or use drugs.  Allergies  Allergen Reactions   Ace Inhibitors Itching and Cough    Family History  Problem Relation Age of Onset   Hypertension Mother    Kidney disease Mother      Prior to Admission medications   Medication Sig Start Date End Date Taking? Authorizing Provider  allopurinol (ZYLOPRIM) 100 MG tablet Take 1 tablet (100 mg total) by mouth daily. 11/24/18  Yes Dixie Dials, MD  amiodarone (PACERONE) 200 MG tablet Take 0.5 tablets (100 mg total) by mouth daily. 07/21/17  Yes Regalado, Belkys A, MD  calcium acetate (PHOSLO) 667 MG capsule Take 1-3 capsules (667-2,001 mg total) by mouth See admin instructions. Take 2001 mg capsules with meals three times daily and 667 mg capsule with snacks. 11/23/18  Yes Dixie Dials, MD  chlorhexidine (PERIDEX) 0.12 % solution 15 mLs by Mouth Rinse route daily as needed (dryness).  05/29/18  Yes [provider]  ferric citrate (AURYXIA) 1 GM 210 MG(Fe) tablet Take 210 mg by mouth 3 (three) times daily with meals.    Yes [provider]  Lidocaine, Anorectal, (L-M-X 5) 5 % CREA Apply 1 application topically 4 (four) times daily as needed (apply to area up to 6 times daily as needed). Patient taking differently: Apply 1 application topically See admin instructions. Apply up to six times daily as needed for discomfort 07/09/18  Yes Gareth Morgan, MD  lidocaine-prilocaine (EMLA) cream Apply 1 application topically See admin instructions. Apply topically to port access prior to dialysis - Monday, Wednesday, Friday 04/08/15  Yes [provider]  meclizine (ANTIVERT) 25 MG tablet Take 1 tablet (25 mg total) by mouth 3  (three) times daily as needed for dizziness. 08/01/18  Yes Debbe Odea, MD  midodrine (PROAMATINE) 10 MG tablet 30 mg  on the days of dyalisis three times a week, and only takes 10 mg on off dyalisis days. Patient taking differently: Take 10-30 mg by mouth See admin instructions. 30 mg  on the days of dialysis three times a week, and only takes 10 mg on off dialysis days. 07/18/18  Yes Rodriguez-Southworth, Sunday Spillers, PA-C  multivitamin (RENA-VIT) TABS tablet Take 1 tablet by mouth at bedtime.   Yes [provider]  pantoprazole (PROTONIX) 40 MG tablet Take 40 mg by mouth 2 (two) times daily.  Yes [provider]  warfarin (COUMADIN) 2.5 MG tablet Take 3 tablets (7.5 mg total) by mouth daily at 6 PM. 11/23/18  Yes Dixie Dials, MD  nitroGLYCERIN (NITROSTAT) 0.4 MG SL tablet Place 0.4 mg under the tongue every 5 (five) minutes as needed for chest pain.  05/20/15   [provider]  UNABLE TO FIND Out patient physical therapy- vestibular therapy for BPPV 08/01/18   Debbe Odea, MD    Physical Exam:  Constitutional: NAD, calm, comfortable, very polite and kind Vitals:   01/18/19 1600 01/18/19 1615 01/18/19 1630 01/18/19 1726  BP: 94/60  (!) 92/54 112/65  Pulse: 81 83  85  Resp: (!) 23 (!) 21 14 18   Temp:    97.8 F (36.6 C)  TempSrc:    Oral  SpO2: 100% 97%  100%  Weight:      Height:       Eyes: PERRL, lids and conjunctivae normal ENMT: Mucous membranes are moist. Posterior pharynx clear of any exudate or lesions.Normal dentition.  Neck: normal, supple, no masses, no thyromegaly Respiratory: clear to auscultation bilaterally, no wheezing, no crackles. Normal respiratory effort. No accessory muscle use.  Cardiovascular: Regular rate and rhythm, no murmurs / rubs / gallops. No extremity edema. 2+ pedal pulses. No carotid bruits.  Abdomen: Mild tenderness, no masses palpated. No hepatosplenomegaly. Bowel sounds positive.  Musculoskeletal: no clubbing / cyanosis. No  joint deformity upper and lower extremities. Good ROM, no contractures. Normal muscle tone.  Skin: no rashes, lesions, ulcers. No induration Neurologic: CN 2-12 grossly intact. Sensation intact, DTR normal. Strength 5/5 in all 4.  Psychiatric: Normal judgment and insight. Alert and oriented x 3. Normal mood.    Labs on Admission: I have personally reviewed following labs and imaging studies  CBC: Recent Labs  Lab 01/18/19 1300  WBC 6.5  NEUTROABS 4.2  HGB 11.2*  HCT 35.0*  MCV 100.0  PLT 893   Basic Metabolic Panel: Recent Labs  Lab 01/18/19 1300  NA 137  K 4.0  CL 94*  CO2 26  GLUCOSE 84  BUN 34*  CREATININE 10.12*  CALCIUM 9.1   Coagulation Profile: Recent Labs  Lab 01/18/19 1722  INR 2.4*   CBG: Recent Labs  Lab 01/18/19 1721  GLUCAP 98   Urine analysis:    Component Value Date/Time   COLORURINE YELLOW 05/19/2011 [place] 05/19/2011 1042   LABSPEC 1.013 05/19/2011 1042   PHURINE 6.5 05/19/2011 1042   GLUCOSEU NEGATIVE 05/19/2011 1042   HGBUR TRACE (A) 05/19/2011 1042   BILIRUBINUR NEGATIVE 05/19/2011 1042   KETONESUR NEGATIVE 05/19/2011 1042   PROTEINUR >300 (A) 05/19/2011 1042   UROBILINOGEN 0.2 05/19/2011 1042   NITRITE NEGATIVE 05/19/2011 1042   LEUKOCYTESUR NEGATIVE 05/19/2011 1042    Radiological Exams on Admission: Dg Chest Portable 1 View  Result Date: 01/18/2019 CLINICAL DATA:  Weakness, dizziness and hypotension. EXAM: PORTABLE CHEST 1 VIEW COMPARISON:  11/22/2018 and prior radiographs FINDINGS: Cardiomegaly and elevated LEFT hemidiaphragm again noted. There is no evidence of focal airspace disease, pulmonary edema, suspicious pulmonary nodule/mass, pleural effusion, or pneumothorax. No acute bony abnormalities are identified. IMPRESSION: Cardiomegaly without evidence of acute cardiopulmonary disease. Electronically Signed   By: Margarette Canada M.D.   On: 01/18/2019 13:23    EKG: Independently reviewed.  Sinus  rhythm Abnormal R-wave progression, late transition LVH with secondary repolarization abnormality Probable lateral infarct, age indeterminate Borderline prolonged QT interval No significant change when compared to prior  Assessment/Plan Principal  Problem:   Hypotension Active Problems:   Dizziness   Hypotension of hemodialysis   Chronic combined systolic (congestive) and diastolic (congestive) heart failure (HCC)   DM II (diabetes mellitus, type II), controlled (HCC)   End-stage renal disease on hemodialysis (HCC)   Paroxysmal atrial flutter (HCC)   Hypertensive heart and CKD, ESRD on dialysis, w CHF (HCC)   Gout, chronic   Chronic pain   Blind right eye   GERD (gastroesophageal reflux disease)   Malnutrition of moderate degree   1.  Hypotension and dizziness: Patient has a history of hypotension associated with hemodialysis.  If he may be a little bit dehydrated I am going to give him an additional 500 mL of fluid for a total of 1 L.  This time I am going to give it to him at 100 mL/h.  We have noticed an improvement in his blood pressure after the first bolus already.  Will monitor overnight hopefully this fluid will help him I believe he may have gotten dehydrated associated with the significant diarrhea he is having.  I am going to send his stool for C. difficile as well.  Going to hold his meclizine as I believe that his dizziness may be related to hypotension and not to a vestibular issue  2.  Chronic combined systolic and diastolic congestive heart failure: Patient has an apparent EF of approximately 35 to 40% in July 2020.  Will monitor very closely.  Will check daily weights.  Will avoid large amounts of fluid.  3.  Diabetes type 2: Currently blood sugars are fairly stable I suspect as a result of his renal failure.  He is not on any anti-diabetic medications at this point.  In fact the patient has been enjoying regular sugared soft drinks without any problems with his blood  sugars recently.  4.  End-stage renal disease on hemodialysis Monday Wednesday Friday: Noted Dr. Raquel Sarna has been notified patient will need dialysis tomorrow prior to discharge.  5.  Paroxysmal atrial flutter: Patient is anticoagulated with warfarin and this has been resumed.  6.  Chronic gout: Noted continue home medication management which is allopurinol.  7.  Blindness in right eye: Noted.  8.  Gastroesophageal reflux disease: Continue home medication management.  9.  Malnutrition of moderate degree: Noted.  Encourage healthy diet    DVT prophylaxis: Subcu heparin Code Status: Full code Family Communication: Patient stated he would discuss with his family. Disposition Plan: Hopefully home in a.m. Consults called: Nephrology, Dr. Carollee Leitz; cardiology Dr. Doylene Canard Admission status: Observation   Lady Deutscher MD FACP [facility] Pager [phone]  How to contact the [facility] Attending or Consulting provider [place] or covering provider during after hours [place], for this patient?  1. Check the care team in [facility] and look for a) attending/consulting [facility] provider listed and b) the [facility] team listed 2. Log into [email or web address] and use [place]'s universal password to access. If you do not have the password, please contact the hospital operator. 3. Locate the [facility] provider you are looking for under [facility] and page to a number that you can be directly reached. 4. If you still have difficulty reaching the provider, please page the [facility] (Director on Call) for the Hospitalists listed on amion for assistance.  If 7PM-7AM, please contact night-coverage [email or web address] Password [facility]  01/18/2019, 6:12 PM

## 2019-01-19 DIAGNOSIS — E1122 Type 2 diabetes mellitus with diabetic chronic kidney disease: Secondary | ICD-10-CM | POA: Diagnosis not present

## 2019-01-19 DIAGNOSIS — I5022 Chronic systolic (congestive) heart failure: Secondary | ICD-10-CM | POA: Diagnosis not present

## 2019-01-19 DIAGNOSIS — I5042 Chronic combined systolic (congestive) and diastolic (congestive) heart failure: Secondary | ICD-10-CM | POA: Diagnosis not present

## 2019-01-19 DIAGNOSIS — N186 End stage renal disease: Secondary | ICD-10-CM | POA: Diagnosis not present

## 2019-01-19 DIAGNOSIS — R42 Dizziness and giddiness: Secondary | ICD-10-CM

## 2019-01-19 DIAGNOSIS — Z7901 Long term (current) use of anticoagulants: Secondary | ICD-10-CM | POA: Diagnosis not present

## 2019-01-19 DIAGNOSIS — E860 Dehydration: Secondary | ICD-10-CM | POA: Diagnosis not present

## 2019-01-19 DIAGNOSIS — I959 Hypotension, unspecified: Secondary | ICD-10-CM

## 2019-01-19 DIAGNOSIS — I953 Hypotension of hemodialysis: Secondary | ICD-10-CM | POA: Diagnosis not present

## 2019-01-19 DIAGNOSIS — Z992 Dependence on renal dialysis: Secondary | ICD-10-CM

## 2019-01-19 DIAGNOSIS — H5440 Blindness, one eye, unspecified eye: Secondary | ICD-10-CM

## 2019-01-19 LAB — CBC
HCT: 30.3 % — ABNORMAL LOW (ref 39.0–52.0)
Hemoglobin: 9.7 g/dL — ABNORMAL LOW (ref 13.0–17.0)
MCH: 32.3 pg (ref 26.0–34.0)
MCHC: 32 g/dL (ref 30.0–36.0)
MCV: 101 fL — ABNORMAL HIGH (ref 80.0–100.0)
Platelets: 188 10*3/uL (ref 150–400)
RBC: 3 MIL/uL — ABNORMAL LOW (ref 4.22–5.81)
RDW: 16.7 % — ABNORMAL HIGH (ref 11.5–15.5)
WBC: 6.1 10*3/uL (ref 4.0–10.5)
nRBC: 0 % (ref 0.0–0.2)

## 2019-01-19 LAB — C DIFFICILE QUICK SCREEN W PCR REFLEX
C Diff antigen: NEGATIVE
C Diff interpretation: NOT DETECTED
C Diff toxin: NEGATIVE

## 2019-01-19 LAB — RENAL FUNCTION PANEL
Albumin: 3 g/dL — ABNORMAL LOW (ref 3.5–5.0)
Anion gap: 15 (ref 5–15)
BUN: 45 mg/dL — ABNORMAL HIGH (ref 6–20)
CO2: 26 mmol/L (ref 22–32)
Calcium: 8.9 mg/dL (ref 8.9–10.3)
Chloride: 96 mmol/L — ABNORMAL LOW (ref 98–111)
Creatinine, Ser: 12.44 mg/dL — ABNORMAL HIGH (ref 0.61–1.24)
GFR calc Af Amer: 5 mL/min — ABNORMAL LOW (ref >60)
GFR calc non Af Amer: 4 mL/min — ABNORMAL LOW (ref >60)
Glucose, Bld: 103 mg/dL — ABNORMAL HIGH (ref 70–99)
Phosphorus: 8.4 mg/dL — ABNORMAL HIGH (ref 2.5–4.6)
Potassium: 4.6 mmol/L (ref 3.5–5.1)
Sodium: 137 mmol/L (ref 135–145)

## 2019-01-19 LAB — PROCALCITONIN: Procalcitonin: 0.88 ng/mL

## 2019-01-19 LAB — PROTIME-INR
INR: 2.5 — ABNORMAL HIGH (ref 0.8–1.2)
Prothrombin Time: 26.4 seconds — ABNORMAL HIGH (ref 11.4–15.2)

## 2019-01-19 MED ORDER — MIDODRINE HCL 10 MG PO TABS: 10.0000 mg | ORAL_TABLET | Freq: Three times a day (TID) | ORAL | 2 refills | Status: AC

## 2019-01-19 MED ORDER — WARFARIN - PHARMACIST DOSING INPATIENT: Freq: Every day | Status: DC

## 2019-01-19 MED ORDER — BACID PO TABS: 2.0000 | ORAL_TABLET | Freq: Three times a day (TID) | ORAL | 0 refills | Status: AC

## 2019-01-19 MED ORDER — MIDODRINE HCL 5 MG PO TABS
ORAL_TABLET | ORAL | Status: AC
  Filled 2019-01-19: qty 2

## 2019-01-19 MED ORDER — LOPERAMIDE HCL 2 MG PO CAPS: 2.0000 mg | ORAL_CAPSULE | ORAL | 0 refills | Status: DC | PRN

## 2019-01-19 MED ORDER — WARFARIN SODIUM 7.5 MG PO TABS
7.5000 mg | ORAL_TABLET | Freq: Once | ORAL | Status: DC
  Filled 2019-01-19: qty 1

## 2019-01-19 MED ORDER — BACID PO TABS
2.0000 | ORAL_TABLET | Freq: Three times a day (TID) | ORAL | Status: DC
  Filled 2019-01-19 (×3): qty 2

## 2019-01-19 MED ORDER — DOXERCALCIFEROL 4 MCG/2ML IV SOLN
INTRAVENOUS | Status: AC
  Filled 2019-01-19: qty 4

## 2019-01-19 MED ORDER — LOPERAMIDE HCL 2 MG PO CAPS: 2.0000 mg | ORAL_CAPSULE | ORAL | Status: DC | PRN

## 2019-01-19 NOTE — Discharge Summary (Signed)
5         Physician Discharge Summary [facility]    Patient: [name]                   Admit date: 01/18/2019   DOB: [birth year]             Discharge date:01/19/2019/1:31 PM IRJ:[id number]                          PCP: Shelby Mattocks, PA-C  Disposition: HOME   Recommendations for Outpatient Follow-up:    Follow up: in 1 week  Discharge Condition: Stable   Code Status:   Code Status: Full Code  Diet recommendation: Renal diet   Discharge Diagnoses:    Principal Problem:   Hypotension Active Problems:   Chronic combined systolic (congestive) and diastolic (congestive) heart failure ([place])   DM II (diabetes mellitus, type II), controlled ([place])   Gout, chronic   End-stage renal disease on hemodialysis (HCC)   Paroxysmal atrial flutter (HCC)   Chronic pain   Blind right eye   GERD (gastroesophageal reflux disease)   Malnutrition of moderate degree   Hypertensive heart and CKD, ESRD on dialysis, w CHF ([place])   Dizziness   Hypotension of hemodialysis   History of Present Illness/ Hospital Course [name] Summary:   [name] is a 58 y.o. 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  He has been having dizziness and hypotension worse since Monday.  He had to come off dialysis 30 minutes early yesterday because his blood pressure was too low.  He complains of dizziness with movement and feels a little short of breath.  He has had  diarrhea since Monday.  He always has diarrhea but he is having more frequent episodes he is up to 3-4 bowel movements a day increased from his usual 1-2 bowel movements daily.  His systolic blood pressure usually runs in the low 90s and 80s but currently is in the 70s and he feels quite poorly.  Has not tried anything for his symptoms.  He states his dizziness resolves with rest.  He has some dyspnea on exertion but no associated shortness of breath at rest.  He has no fever, headache, cough, chest pain, numbness or tingling of the extremities or abdominal pain.  ED Course: In the ED lab work showed anemia improved from baseline, chronic renal insufficiency.  Initial lactate level was 2 but there is a low suspicion for acute infectious etiology or sepsis.  Chest x-ray is reassuring with no evidence of effusion or infiltrate.  He does not produce urine and therefore urine studies were not performed.  Blood cultures were obtained.  He appeared to be somewhat dry and was given a 500 mL fluid bolus in the emergency department.  His COVID test was negative.  He was referred to me for further evaluation and management due to his dizziness.  Hospital course 1.  Hypotension and dizziness: Patient has a history of hypotension associated with hemodialysis.  He received a  liter of normal saline, along with 500 mL bolus. Restarted on home medication of Midrin 10 mg p.o. 3 times daily, status post hemodialysis, blood pressures improved, 102/47, post hemodialysis today 92/62  -Blood pressure may have been affected by mild dehydration due to diarrhea.  Diarrhea -C. difficile was negative, patient started on diet, PRN Imodium, lactobacillus  Dizziness -Improved with improved blood pressure likely due to hypotension -Antivert was DC'd  2.  Chronic combined systolic and diastolic congestive heart failure: Patient has an apparent EF of approximately 35 to 40% in July 2020.  Will monitor very closely.  check daily  weights.  Will avoid large amounts of fluid. -Cardiology was consulted patient was seen and evaluated, continue current medication no changes were made.  3.  Diabetes type 2:   anti-diabetic medications at this point.  In fact the patient has been enjoying regular sugared soft drinks without any problems with his blood sugars recently. -Fasting blood sugars has been below 126.  4.  End-stage renal disease on hemodialysis Monday Wednesday Friday: Noted Dr. Raquel Sarna, was notified, status post hemodialysis today.  Tolerated well.  Blood pressure remained stable. -Vascular surgery was consulted for evaluation of the graft site no signs of infection or revision needed at this time.  Follow-up as an outpatient.  5.  Paroxysmal atrial flutter: Patient is anticoagulated with warfarin, resumed.  6.  Chronic gout: Noted continue home medication of allopurinol.  7.  Blindness in right eye: Noted.  8.  Gastroesophageal reflux disease: Continue home medication management.  9.  Malnutrition of moderate degree: Noted.  Encourage healthy diet    Code Status: Full code Family Communication: Patient stated he would discuss with his family. Disposition Plan: Hopefully home Consults called: Nephrology, Dr. Carollee Leitz; cardiology Dr. Doylene Canard Admission status: Observation    Discharge Instructions:   Discharge Instructions    Activity as tolerated - No restrictions   Complete by: As directed    Diet - low sodium heart healthy   Complete by: As directed    Discharge instructions   Complete by: As directed    Please follow-up with your primary nephrologist in 1 week.  Please follow-up with PCP and cardiologist in 1 to 2 weeks.  Medication needs to be adjusted titrated.   Increase activity slowly   Complete by: As directed        Medication List    STOP taking these medications   amiodarone 200 MG tablet Commonly known as: PACERONE   meclizine 25 MG tablet Commonly known as:  ANTIVERT     TAKE these medications   allopurinol 100 MG tablet Commonly known as: ZYLOPRIM Take 1 tablet (100 mg total) by mouth daily.   Auryxia 1 GM 210 MG(Fe) tablet Generic drug: ferric citrate Take 210 mg by mouth 3 (three) times daily with meals.   calcium acetate 667 MG capsule Commonly known as: PHOSLO Take 1-3 capsules (667-2,001 mg total) by mouth See admin instructions. Take 2001 mg capsules with meals three times daily and 667 mg capsule with snacks.   chlorhexidine 0.12 % solution Commonly known as: PERIDEX 15 mLs by Mouth Rinse route daily as needed (dryness).   lactobacillus acidophilus Tabs tablet Take 2 tablets by mouth 3 (three) times daily for 5 days.   Lidocaine (Anorectal) 5 % Crea Commonly known as: L-M-X 5 Apply 1 application topically 4 (four) times daily as needed (apply to area up to 6 times daily as needed). What changed:   when to take this  additional instructions   lidocaine-prilocaine cream Commonly known as: EMLA Apply 1 application topically See admin instructions. Apply topically to port access prior to dialysis - Monday, Wednesday, Friday   loperamide 2 MG capsule Commonly known as: IMODIUM Take 1 capsule (2 mg total) by mouth as needed for diarrhea or loose stools.   midodrine 10 MG tablet Commonly known as: PROAMATINE Take 1 tablet (10 mg total) by mouth 3 (three) times daily with meals. What changed:   how much to take  how to take this  when to take this  additional instructions   multivitamin Tabs tablet Take 1 tablet by mouth at bedtime.   nitroGLYCERIN 0.4 MG SL tablet Commonly known as: NITROSTAT Place 0.4 mg under the tongue every 5 (five) minutes as needed for chest pain.   pantoprazole 40 MG tablet Commonly known as: PROTONIX Take 40 mg by mouth 2 (two) times daily.   UNABLE TO FIND Out patient physical therapy- vestibular therapy for BPPV   warfarin 2.5 MG tablet Commonly known as: COUMADIN Take 3  tablets (7.5 mg total) by mouth daily at 6 PM.      Follow-up Information    Rodriguez-Southworth, Sunday Spillers, PA-C. Schedule an appointment as soon as possible for a visit in 2 week(s).   Specialties: Internal Medicine, Emergency Medicine Contact information: [address] [place] [address] [phone]        Dixie Dials, MD. Schedule an appointment as soon as possible for a visit in 1 week(s).   Specialty: Cardiology Contact information: [place] [address] [phone]          Allergies  Allergen Reactions   Ace Inhibitors Itching and Cough     Procedures /Studies:   Dg Chest Portable 1 View  Result Date: 01/18/2019 CLINICAL DATA:  Weakness, dizziness and hypotension. EXAM: PORTABLE CHEST 1 VIEW COMPARISON:  11/22/2018 and prior radiographs FINDINGS: Cardiomegaly and elevated LEFT hemidiaphragm again noted. There is no evidence of focal airspace disease, pulmonary edema, suspicious pulmonary nodule/mass, pleural effusion, or pneumothorax. No acute bony abnormalities are identified. IMPRESSION: Cardiomegaly without evidence of acute cardiopulmonary disease. Electronically Signed   By: Margarette Canada M.D.   On: 01/18/2019 13:23     Subjective:   Patient was seen and examined 01/19/2019, 1:31 PM Patient stable today. No acute distress.  No issues overnight Stable for discharge.  Discharge Exam:    Vitals:   01/19/19 1030 01/19/19 1100 01/19/19 1130 01/19/19 1248  BP: (!) 85/61 97/84 (!) 102/47 92/62  Pulse: 80 81 80 87  Resp: 19 18 17 18   Temp:   97.8 F (36.6 C) 98.1 F (36.7 C)  TempSrc:   Oral Oral  SpO2:   98% 100%  Weight:   112.4 kg   Height:        General: Pt lying comfortably in bed & appears in no obvious distress. Cardiovascular: S1 & S2 heard, RRR, S1/S2 +. No murmurs, rubs, gallops or clicks. No JVD or pedal edema. Respiratory: Clear to auscultation without wheezing, rhonchi or crackles. No increased  work of breathing. Abdominal:  Non-distended, non-tender & soft. No organomegaly or masses appreciated. Normal bowel sounds heard. CNS: Alert and oriented. No focal deficits. Extremities: no edema, no cyanosis    The results of significant diagnostics from this hospitalization (including imaging, microbiology, ancillary and laboratory) are listed below for reference.      Microbiology:   Recent Results (from the past 240 hour(s))  SARS Coronavirus 2 (  Hospital order, Performed in [facility] hospital lab) Nasopharyngeal Nasopharyngeal Swab     Status: None   Collection Time: 01/18/19  1:11 PM   Specimen: Nasopharyngeal Swab  Result Value Ref Range Status   SARS Coronavirus 2 NEGATIVE NEGATIVE Final    Comment: (NOTE) If result is NEGATIVE SARS-CoV-2 target nucleic acids are NOT DETECTED. The SARS-CoV-2 RNA is generally detectable in upper and lower  respiratory specimens during the acute phase of infection. The lowest  concentration of SARS-CoV-2 viral copies this assay can detect is 250  copies / mL. A negative result does not preclude SARS-CoV-2 infection  and should not be used as the sole basis for treatment or other  patient management decisions.  A negative result may occur with  improper specimen collection / handling, submission of specimen other  than nasopharyngeal swab, presence of viral mutation(s) within the  areas targeted by this assay, and inadequate number of viral copies  (<250 copies / mL). A negative result must be combined with clinical  observations, patient history, and epidemiological information. If result is POSITIVE SARS-CoV-2 target nucleic acids are DETECTED. The SARS-CoV-2 RNA is generally detectable in upper and lower  respiratory specimens dur ing the acute phase of infection.  Positive  results are indicative of active infection with SARS-CoV-2.  Clinical  correlation with patient history and other diagnostic information is  necessary to  determine patient infection status.  Positive results do  not rule out bacterial infection or co-infection with other viruses. If result is PRESUMPTIVE POSTIVE SARS-CoV-2 nucleic acids MAY BE PRESENT.   A presumptive positive result was obtained on the submitted specimen  and confirmed on repeat testing.  While 2019 novel coronavirus  (SARS-CoV-2) nucleic acids may be present in the submitted sample  additional confirmatory testing may be necessary for epidemiological  and / or clinical management purposes  to differentiate between  SARS-CoV-2 and other Sarbecovirus currently known to infect humans.  If clinically indicated additional testing with an alternate test  methodology [phone]) is advised. The SARS-CoV-2 RNA is generally  detectable in upper and lower respiratory sp ecimens during the acute  phase of infection. The expected result is Negative. Fact Sheet for Patients:  StrictlyIdeas.no Fact Sheet for Healthcare Providers: BankingDealers.co.za This test is not yet approved or cleared by the [place] FDA and has been authorized for detection and/or diagnosis of SARS-CoV-2 by FDA under an Emergency Use Authorization (EUA).  This EUA will remain in effect (meaning this test can be used) for the duration of the COVID-19 declaration under Section 564(b)(1) of the Act, 21 U.S.C. section 360bbb-3(b)(1), unless the authorization is terminated or revoked sooner. Performed at [facility], [place] [address], [place], [place] [address]   C difficile quick scan w PCR reflex     Status: None   Collection Time: 01/19/19 12:08 AM   Specimen: STOOL  Result Value Ref Range Status   C Diff antigen NEGATIVE NEGATIVE Final   C Diff toxin NEGATIVE NEGATIVE Final   C Diff interpretation No C. difficile detected.  Final    Comment: Performed at [facility], [place] [address] [place], [place] [address]     Labs:   CBC: Recent Labs    Lab 01/18/19 1300 01/19/19 0631  WBC 6.5 6.1  NEUTROABS 4.2  --   HGB 11.2* 9.7*  HCT 35.0* 30.3*  MCV 100.0 101.0*  PLT 228 638   Basic Metabolic Panel: Recent Labs  Lab 01/18/19 1300 01/19/19  0631  NA 137 137  K 4.0 4.6  CL 94* 96*  CO2 26 26  GLUCOSE 84 103*  BUN 34* 45*  CREATININE 10.12* 12.44*  CALCIUM 9.1 8.9  PHOS  --  8.4*   Liver Function Tests: Recent Labs  Lab 01/19/19 0631  ALBUMIN 3.0*   BNP (last 3 results) No results for input(s): BNP in the last 8760 hours. Cardiac Enzymes: No results for input(s): CKTOTAL, CKMB, CKMBINDEX, TROPONINI in the last 168 hours. CBG: Recent Labs  Lab 01/18/19 1721  GLUCAP 98   Hgb A1c No results for input(s): HGBA1C in the last 72 hours. Lipid Profile No results for input(s): CHOL, HDL, LDLCALC, TRIG, CHOLHDL, LDLDIRECT in the last 72 hours. Thyroid function studies No results for input(s): TSH, T4TOTAL, T3FREE, THYROIDAB in the last 72 hours.  Invalid input(s): FREET3 Anemia work up No results for input(s): VITAMINB12, FOLATE, FERRITIN, TIBC, IRON, RETICCTPCT in the last 72 hours. Urinalysis    Component Value Date/Time   COLORURINE YELLOW 05/19/2011 1042   APPEARANCEUR CLEAR 05/19/2011 1042   LABSPEC 1.013 05/19/2011 1042   PHURINE 6.5 05/19/2011 1042   GLUCOSEU NEGATIVE 05/19/2011 1042   HGBUR TRACE (A) 05/19/2011 1042   BILIRUBINUR NEGATIVE 05/19/2011 1042   KETONESUR NEGATIVE 05/19/2011 1042   PROTEINUR >300 (A) 05/19/2011 1042   UROBILINOGEN 0.2 05/19/2011 1042   NITRITE NEGATIVE 05/19/2011 1042   LEUKOCYTESUR NEGATIVE 05/19/2011 1042    Time coordinating discharge: Over 40 minutes  SIGNED: Deatra James, MD, FACP, FHM. [facility],  Pager [phone]  If 7PM-7AM, please contact night-coverage [email or web address] O[facility] 01/19/2019, 1:31 PM

## 2019-01-19 NOTE — Procedures (Signed)
I was present at this dialysis session. I have reviewed the session itself and made appropriate changes.   Seen on HD. 2K bath, UF goal 0.5L.  Pt reports improvement in dizziness.  BPs are stable.  VVS note reviewed, will have revision as outpt. C diff neg.    Filed Weights   01/18/19 1233 01/18/19 2141  Weight: 111.1 kg 111 kg    Recent Labs  Lab 01/19/19 0631  NA 137  K 4.6  CL 96*  CO2 26  GLUCOSE 103*  BUN 45*  CREATININE 12.44*  CALCIUM 8.9  PHOS 8.4*    Recent Labs  Lab 01/18/19 1300 01/19/19 0631  WBC 6.5 6.1  NEUTROABS 4.2  --   HGB 11.2* 9.7*  HCT 35.0* 30.3*  MCV 100.0 101.0*  PLT 228 188    Scheduled Meds: . allopurinol  100 mg Oral Daily  . amiodarone  100 mg Oral Daily  . calcium acetate  2,001 mg Oral TID WC  . calcium acetate  667 mg Oral With snacks  . Chlorhexidine Gluconate Cloth  6 each Topical Q0600  . doxercalciferol  6 mcg Intravenous Q M,W,F-HD  . ferric citrate  210 mg Oral TID WC  . heparin  5,000 Units Subcutaneous Q8H  . lactobacillus acidophilus  2 tablet Oral TID  . lidocaine-prilocaine  1 application Topical Q M,W,F-HD  . midodrine  10 mg Oral TID WC  . multivitamin  1 tablet Oral QHS  . pantoprazole  40 mg Oral BID  . sodium chloride flush  3 mL Intravenous Q12H   Continuous Infusions: PRN Meds:.acetaminophen **OR** acetaminophen, chlorhexidine, loperamide, ondansetron **OR** ondansetron (ZOFRAN) IV, oxyCODONE, polyethylene glycol, traZODone   Pearson Grippe  MD 01/19/2019, 8:35 AM

## 2019-01-19 NOTE — Progress Notes (Addendum)
ANTICOAGULATION CONSULT NOTE - Initial Consult  Pharmacy Consult for warfarin Indication: afib/h/o DVT  Allergies  Allergen Reactions  . Ace Inhibitors Itching and Cough    Patient Measurements: Height: 6\' 1"  (185.4 cm) Weight: 250 lb (113.4 kg) IBW/kg (Calculated) : 79.9  Vital Signs: Temp: 97.9 F (36.6 C) (08/28 0715) Temp Source: Oral (08/28 0715) BP: (P) 93/58 (08/28 0900) Pulse Rate: (P) 79 (08/28 0900)  Labs: Recent Labs    01/18/19 1300 01/18/19 1722 01/19/19 0631  HGB 11.2*  --  9.7*  HCT 35.0*  --  30.3*  PLT 228  --  188  LABPROT  --  25.8*  --   INR  --  2.4*  --   CREATININE 10.12*  --  12.44*    Estimated Creatinine Clearance: 8.9 mL/min (A) (by C-G formula based on SCr of 12.44 mg/dL (H)).   Medical History: Past Medical History:  Diagnosis Date  . Acute respiratory failure with hypoxia ([place]) 01/13/2015  . Atrial flutter ([place])   . Bile leak, postoperative 03/15/2016  . Biliary dyskinesia 02/12/2016  . 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 Cardiomyopathy   . CHF (congestive heart failure) ([place])   . Coronary artery disease    normal coronaries by 10/10/08 cath, Cardiac Cath 08-04-12 epic.Dr. Doylene Canard follows  . Diabetes mellitus    pt. states he's borderline diabetic., no longer taking med,- off med. since 2013  . Dialysis patient [facility])    Mon-Wed-Fri([place])- Left AV fistula  . Diverticulitis November 2016   reoccurred in December 2016  . Diverticulitis of intestine without perforation or abscess without bleeding 04/21/2015  . ESRD (end stage renal disease) ([place])   . GERD (gastroesophageal reflux disease)   . Gout   . Hemorrhage of left kidney 01/13/2018  . History of nephrectomy 07/04/2012   History of right nephrectomy in 2002 for renal cell carcinoma   . History of unilateral nephrectomy   . Hypertension   . Low iron   . Myocardial infarction [facility]) ?2006  . Renal cell carcinoma    dialysis- MWF- Dr.  Mercy Moore follows.  . Renal insufficiency   . Shortness of breath 05/19/11   "at rest, lying down, w/exertion"  . Stroke [facility]) 02/2011   05/19/11 denies residual  . Umbilical hernia 41/74/08   unrepaired    Assessment: 58yo male with ESRD on HD MWF and aflutter on warfarin. Admitted with dizziness and diarrhea. Patient's warfarin held on 8/27. INR 2.4. D/W Dr. Roger Shelter and will restart warfarin today. PTA dose 7.5mg  daily (Last INR on 8/5 2.9). INR 2.5 today  Goal of Therapy:  INR 2-3 Monitor platelets by anticoagulation protocol: Yes   Plan:  Warfarin 7.5mg  x 1 tonight Daily INR while inpatient.  Monitor for bleeding  Esty Ahuja A. Levada Dy, PharmD, BCPS, FNKF Clinical Pharmacist [place] Please utilize Amion for appropriate phone number to reach the unit pharmacist ([place])   01/19/2019,9:16 AM

## 2019-01-19 NOTE — Progress Notes (Signed)
DISCHARGE NOTE HOME [name] to be discharged Home per MD order. Discussed prescriptions and follow up appointments with the patient. Prescriptions given to patient; medication list explained in detail. Patient verbalized understanding.  Skin clean, dry and intact without evidence of skin break down, no evidence of skin tears noted. IV catheter discontinued intact. Site without signs and symptoms of complications. Dressing and pressure applied. Pt denies pain at the site currently. No complaints noted.  Patient free of lines, drains, and wounds.   An After Visit Summary (AVS) was printed and given to the patient. Patient escorted via wheelchair, and discharged home via private auto.  Aneta Mins BSN, RN3

## 2019-01-19 NOTE — Consult Note (Signed)
Ref: Shelby Mattocks, PA-C   Subjective:  Awake. Not dizzy. VS stable. Undergoing modified dialysis session.   Objective:  Vital Signs in the last 24 hours: Temp:  [97.8 F (36.6 C)-98.3 F (36.8 C)] 97.9 F (36.6 C) (08/28 0715) Pulse Rate:  [66-99] 79 (08/28 0900) Cardiac Rhythm: Normal sinus rhythm (08/28 0720) Resp:  [12-24] 16 (08/28 0900) BP: (70-112)/(45-76) 93/58 (08/28 0900) SpO2:  [96 %-100 %] 96 % (08/28 0715) Weight:  [110.7 kg-113.4 kg] 113.4 kg (08/28 0715)  Physical Exam: BP Readings from Last 1 Encounters:  01/19/19 (!) 93/58     Wt Readings from Last 1 Encounters:  01/19/19 113.4 kg    Weight change:  Body mass index is 32.98 kg/m. HEENT: [place]/AT, Eyes-Brown, PERL, EOMI, Conjunctiva-Pale pink, Sclera-Non-icteric Neck: No JVD, No bruit, Trachea midline. Lungs:  Clear, Bilateral. Cardiac:  Regular rhythm, normal S1 and S2, no S3. II/VI systolic murmur. Abdomen:  Soft, non-tender. BS present. Extremities:  No edema present. No cyanosis. No clubbing. Left forearm AVF. CNS: AxOx3, Cranial nerves grossly intact, moves all 4 extremities.  Skin: Warm and dry.   Intake/Output from previous day: 08/27 0701 - 08/28 0700 In: 240 [P.O.:240] Out: 0     Lab Results: BMET    Component Value Date/Time   NA 137 01/19/2019 0631   NA 137 01/18/2019 1300   NA 137 11/23/2018 0348   K 4.6 01/19/2019 0631   K 4.0 01/18/2019 1300   K 4.1 11/23/2018 0348   CL 96 (L) 01/19/2019 0631   CL 94 (L) 01/18/2019 1300   CL 99 11/23/2018 0348   CO2 26 01/19/2019 0631   CO2 26 01/18/2019 1300   CO2 25 11/23/2018 0348   GLUCOSE 103 (H) 01/19/2019 0631   GLUCOSE 84 01/18/2019 1300   GLUCOSE 90 11/23/2018 0348   BUN 45 (H) 01/19/2019 0631   BUN 34 (H) 01/18/2019 1300   BUN 23 (H) 11/23/2018 0348   CREATININE 12.44 (H) 01/19/2019 0631   CREATININE 10.12 (H) 01/18/2019 1300   CREATININE 8.53 (H) 11/23/2018 0348   CALCIUM 8.9 01/19/2019 0631   CALCIUM 9.1  01/18/2019 1300   CALCIUM 8.4 (L) 11/23/2018 0348   GFRNONAA 4 (L) 01/19/2019 0631   GFRNONAA 5 (L) 01/18/2019 1300   GFRNONAA 6 (L) 11/23/2018 0348   GFRAA 5 (L) 01/19/2019 0631   GFRAA 6 (L) 01/18/2019 1300   GFRAA 7 (L) 11/23/2018 0348   CBC    Component Value Date/Time   WBC 6.1 01/19/2019 0631   RBC 3.00 (L) 01/19/2019 0631   HGB 9.7 (L) 01/19/2019 0631   HGB 12.0 (L) 11/09/2018 0936   HCT 30.3 (L) 01/19/2019 0631   HCT 35.0 (L) 11/09/2018 0936   PLT 188 01/19/2019 0631   PLT 237 11/09/2018 0936   MCV 101.0 (H) 01/19/2019 0631   MCV 91 11/09/2018 0936   MCH 32.3 01/19/2019 0631   MCHC 32.0 01/19/2019 0631   RDW 16.7 (H) 01/19/2019 0631   RDW 15.4 11/09/2018 0936   LYMPHSABS 1.2 01/18/2019 1300   MONOABS 0.7 01/18/2019 1300   EOSABS 0.3 01/18/2019 1300   BASOSABS 0.0 01/18/2019 1300   HEPATIC Function Panel Recent Labs    02/04/18 0628 11/16/18 1435 11/22/18 1902  PROT 8.2* 8.6* 9.2*   HEMOGLOBIN A1C No components found for: HGA1C,  MPG CARDIAC ENZYMES Lab Results  Component Value Date   CKTOTAL 136 03/16/2017   CKMB 4.3 (H) 05/20/2011   TROPONINI 0.20 (HH) 01/13/2018   TROPONINI  0.21 (HH) 01/13/2018   TROPONINI 0.27 (HH) 01/13/2018   BNP No results for input(s): PROBNP in the last 8760 hours. TSH Recent Labs    06/20/18 1128  TSH 1.270   CHOLESTEROL Recent Labs    06/20/18 1128 11/09/18 0936  CHOL 213* 214*    Scheduled Meds: . allopurinol  100 mg Oral Daily  . amiodarone  100 mg Oral Daily  . calcium acetate  2,001 mg Oral TID WC  . calcium acetate  667 mg Oral With snacks  . Chlorhexidine Gluconate Cloth  6 each Topical Q0600  . doxercalciferol  6 mcg Intravenous Q M,W,F-HD  . ferric citrate  210 mg Oral TID WC  . lactobacillus acidophilus  2 tablet Oral TID  . lidocaine-prilocaine  1 application Topical Q M,W,F-HD  . midodrine  10 mg Oral TID WC  . multivitamin  1 tablet Oral QHS  . pantoprazole  40 mg Oral BID  . sodium chloride  flush  3 mL Intravenous Q12H  . warfarin  7.5 mg Oral ONCE-1800  . Warfarin - Pharmacist Dosing Inpatient   Does not apply q1800   Continuous Infusions: PRN Meds:.acetaminophen **OR** acetaminophen, chlorhexidine, loperamide, ondansetron **OR** ondansetron (ZOFRAN) IV, oxyCODONE, polyethylene glycol, traZODone  Assessment/Plan: Dizziness Hypotension Dehydration ESRD Type 2 DM Anemia of chronic disease Dilated cardiomyopathy Moderate MR Obesity Paroxysmal atrial fibrillation, CHA2DS2VASc score of 5  May discharge from cardiology and continue warfarin at current dose. F/U in 1 week.   LOS: 0 days   Time spent including chart review, lab review, examination, discussion with patient : 25  min   Dixie Dials  MD  01/19/2019, 10:26 AM

## 2019-01-22 ENCOUNTER — Other Ambulatory Visit: Payer: Self-pay | Admitting: *Deleted

## 2019-01-22 DIAGNOSIS — N186 End stage renal disease: Secondary | ICD-10-CM | POA: Diagnosis not present

## 2019-01-22 DIAGNOSIS — D631 Anemia in chronic kidney disease: Secondary | ICD-10-CM | POA: Diagnosis not present

## 2019-01-22 DIAGNOSIS — Z992 Dependence on renal dialysis: Secondary | ICD-10-CM | POA: Diagnosis not present

## 2019-01-22 DIAGNOSIS — N2581 Secondary hyperparathyroidism of renal origin: Secondary | ICD-10-CM | POA: Diagnosis not present

## 2019-01-23 ENCOUNTER — Ambulatory Visit: Payer: Medicare Other | Admitting: Vascular Surgery

## 2019-01-23 DIAGNOSIS — I120 Hypertensive chronic kidney disease with stage 5 chronic kidney disease or end stage renal disease: Secondary | ICD-10-CM | POA: Diagnosis not present

## 2019-01-23 DIAGNOSIS — Z992 Dependence on renal dialysis: Secondary | ICD-10-CM | POA: Diagnosis not present

## 2019-01-23 DIAGNOSIS — N186 End stage renal disease: Secondary | ICD-10-CM | POA: Diagnosis not present

## 2019-01-23 LAB — CULTURE, BLOOD (ROUTINE X 2)
Culture: NO GROWTH
Culture: NO GROWTH

## 2019-01-23 LAB — STOOL CULTURE

## 2019-01-24 DIAGNOSIS — I4892 Unspecified atrial flutter: Secondary | ICD-10-CM | POA: Diagnosis not present

## 2019-01-24 DIAGNOSIS — N2581 Secondary hyperparathyroidism of renal origin: Secondary | ICD-10-CM | POA: Diagnosis not present

## 2019-01-24 DIAGNOSIS — D509 Iron deficiency anemia, unspecified: Secondary | ICD-10-CM | POA: Diagnosis not present

## 2019-01-24 DIAGNOSIS — N186 End stage renal disease: Secondary | ICD-10-CM | POA: Diagnosis not present

## 2019-01-24 DIAGNOSIS — D631 Anemia in chronic kidney disease: Secondary | ICD-10-CM | POA: Diagnosis not present

## 2019-01-24 DIAGNOSIS — Z992 Dependence on renal dialysis: Secondary | ICD-10-CM | POA: Diagnosis not present

## 2019-01-26 DIAGNOSIS — D631 Anemia in chronic kidney disease: Secondary | ICD-10-CM | POA: Diagnosis not present

## 2019-01-26 DIAGNOSIS — N2581 Secondary hyperparathyroidism of renal origin: Secondary | ICD-10-CM | POA: Diagnosis not present

## 2019-01-26 DIAGNOSIS — Z992 Dependence on renal dialysis: Secondary | ICD-10-CM | POA: Diagnosis not present

## 2019-01-26 DIAGNOSIS — N186 End stage renal disease: Secondary | ICD-10-CM | POA: Diagnosis not present

## 2019-01-26 DIAGNOSIS — D509 Iron deficiency anemia, unspecified: Secondary | ICD-10-CM | POA: Diagnosis not present

## 2019-01-29 DIAGNOSIS — N186 End stage renal disease: Secondary | ICD-10-CM | POA: Diagnosis not present

## 2019-01-29 DIAGNOSIS — Z992 Dependence on renal dialysis: Secondary | ICD-10-CM | POA: Diagnosis not present

## 2019-01-29 DIAGNOSIS — N2581 Secondary hyperparathyroidism of renal origin: Secondary | ICD-10-CM | POA: Diagnosis not present

## 2019-01-29 DIAGNOSIS — D509 Iron deficiency anemia, unspecified: Secondary | ICD-10-CM | POA: Diagnosis not present

## 2019-01-29 DIAGNOSIS — D631 Anemia in chronic kidney disease: Secondary | ICD-10-CM | POA: Diagnosis not present

## 2019-01-30 ENCOUNTER — Encounter (HOSPITAL_COMMUNITY): Payer: Self-pay | Admitting: *Deleted

## 2019-01-30 ENCOUNTER — Other Ambulatory Visit: Payer: Self-pay

## 2019-01-30 ENCOUNTER — Other Ambulatory Visit (HOSPITAL_COMMUNITY)
Admission: RE | Admit: 2019-01-30 | Discharge: 2019-01-30 | Disposition: A | Discharge status: Home / Self Care | Payer: Medicare Other | Source: Ambulatory Visit | Attending: Vascular Surgery | Admitting: Vascular Surgery

## 2019-01-30 DIAGNOSIS — Z20828 Contact with and (suspected) exposure to other viral communicable diseases: Secondary | ICD-10-CM | POA: Diagnosis not present

## 2019-01-30 DIAGNOSIS — Z01812 Encounter for preprocedural laboratory examination: Secondary | ICD-10-CM | POA: Insufficient documentation

## 2019-01-30 LAB — OVA AND PARASITE EXAMINATION

## 2019-01-30 LAB — SPECIMEN STATUS REPORT

## 2019-01-30 LAB — SARS CORONAVIRUS 2 (TAT 6-24 HRS): SARS Coronavirus 2: NEGATIVE

## 2019-01-30 NOTE — Progress Notes (Signed)
Pt denies SOB and chest pain. Pt stated that he is under the care of Dr. Doylene Canard, Cardiology and Marga Hoots- [place], [place], PCP. Pt stated that last doe of Coumadin was 9/6/ 2020 as instructed. Pt made aware to stop taking vitamins, fish oil and herbal medications. Do not take any NSAIDs ie: Ibuprofen, Advil, Naproxen (Aleve), Motrin, BC and Goody Powder. Pt made aware to check CBG every 2 hours prior to arrival to hospital on DOS. Pt made aware to treat a BG < 70 with  4 ounces of apple juice, wait 15 minutes after intervention to recheck BG, if BG remains < 70, call Short Stay unit to speak with a nurse. Pt verbalized understanding of all pre-op instructions. PA, Anesthesiology, asked to review pt history.

## 2019-01-31 DIAGNOSIS — N186 End stage renal disease: Secondary | ICD-10-CM | POA: Diagnosis not present

## 2019-01-31 DIAGNOSIS — Z992 Dependence on renal dialysis: Secondary | ICD-10-CM | POA: Diagnosis not present

## 2019-01-31 DIAGNOSIS — D631 Anemia in chronic kidney disease: Secondary | ICD-10-CM | POA: Diagnosis not present

## 2019-01-31 DIAGNOSIS — N2581 Secondary hyperparathyroidism of renal origin: Secondary | ICD-10-CM | POA: Diagnosis not present

## 2019-01-31 DIAGNOSIS — D509 Iron deficiency anemia, unspecified: Secondary | ICD-10-CM | POA: Diagnosis not present

## 2019-01-31 NOTE — Anesthesia Preprocedure Evaluation (Addendum)
Anesthesia Evaluation    Airway Mallampati: III  TM Distance: >3 FB Neck ROM: Full    Dental no notable dental hx.    Pulmonary neg pulmonary ROS,    Pulmonary exam normal breath sounds clear to auscultation       Cardiovascular hypertension, + CAD and +CHF  Normal cardiovascular exam Rhythm:Regular Rate:Normal  Combined systolic and diastolic CHF, EF 02-72%  TTE 12/22/18:  1. The left ventricle has moderately reduced systolic function, with an ejection fraction of 35-40%. Left ventricular diffuse hypokinesis.  2. Moderate hypokinesis of the left ventricular, entire inferolateral wall.  3. Left atrial size was moderately dilated.  4. Right atrial size was mildly dilated.  5. The mitral valve is degenerative. Mild thickening of the mitral valve leaflet. There is mild mitral annular calcification present. Mitral valve regurgitation is moderate by color flow Doppler.  6. The aortic valve is tricuspid Mild thickening of the aortic valve Moderate calcification of the aortic valve. No stenosis of the aortic valve.  Cath 03/16/16:  Prox LAD lesion, 25 %stenosed.  There is moderate left ventricular systolic dysfunction.  LV end diastolic pressure is normal.  The left ventricular ejection fraction is 25-35% by visual estimate.  There is trivial (1+) mitral regurgitation.   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, No Residual Symptoms    GI/Hepatic GERD  ,Diverticulosis- last acute episode 2016   Endo/Other  diabetes, Well Controlled, Type 2"borderline DM" per patient, off meds since 2013  Renal/GU ESRFRenal diseaseESRD 2/2 RCC s/p nephrectomy, HD MWF  negative genitourinary   Musculoskeletal gout   Abdominal (+) + obese,   Peds  Hematology  (+) anemia ,   Anesthesia Other Findings   Reproductive/Obstetrics negative OB ROS                            Anesthesia  Physical Anesthesia Plan  ASA: III  Anesthesia Plan: Regional   Post-op Pain Management:  Regional for Post-op pain   Induction:   PONV Risk Score and Plan: 1 and Ondansetron and Dexamethasone  Airway Management Planned: Natural Airway and Nasal Cannula  Additional Equipment: None  Intra-op Plan:   Post-operative Plan:   Informed Consent: I have reviewed the patients History and Physical, chart, labs and discussed the procedure including the risks, benefits and alternatives for the proposed anesthesia with the patient or authorized representative who has indicated his/her understanding and acceptance.     Dental advisory given  Plan Discussed with:   Anesthesia Plan Comments: (Follows with Dr. Doylene Canard for paroxysmal atrial flutter on anticoagulation with warfarin, history of pulmonary embolism status post IVC filter, systolic congestive heart failure with an EF of 30 to 35%. He was recently admitted for dehydration and hypotension likely secondary to diarrhea. Improved with hydration.   TTE 12/22/18:  1. The left ventricle has moderately reduced systolic function, with an ejection fraction of 35-40%. Left ventricular diffuse hypokinesis.  2. Moderate hypokinesis of the left ventricular, entire inferolateral wall.  3. Left atrial size was moderately dilated.  4. Right atrial size was mildly dilated.  5. The mitral valve is degenerative. Mild thickening of the mitral valve leaflet. There is mild mitral annular calcification present. Mitral valve regurgitation is moderate by color flow Doppler.  6. The aortic valve is tricuspid Mild thickening of the aortic valve Moderate calcification of the aortic valve. No stenosis of the aortic valve.  Cath 03/16/16:  Prox LAD lesion, 25 %stenosed.  There is moderate left ventricular systolic dysfunction.  LV end diastolic pressure is normal.  The left ventricular ejection fraction is 25-35% by visual estimate.  There is trivial (1+)  mitral regurgitation.)       Anesthesia Quick Evaluation

## 2019-02-01 ENCOUNTER — Encounter (HOSPITAL_COMMUNITY)
Admission: RE | Disposition: A | Discharge status: Home / Self Care | Payer: Self-pay | Source: Home / Self Care | Attending: Vascular Surgery

## 2019-02-01 ENCOUNTER — Ambulatory Visit (HOSPITAL_COMMUNITY)
Admission: RE | Admit: 2019-02-01 | Discharge: 2019-02-01 | Disposition: A | Discharge status: Home / Self Care | Payer: Medicare Other | Attending: Vascular Surgery | Admitting: Vascular Surgery

## 2019-02-01 ENCOUNTER — Encounter: Payer: Self-pay | Admitting: Internal Medicine

## 2019-02-01 ENCOUNTER — Ambulatory Visit (HOSPITAL_COMMUNITY): Payer: Medicare Other | Admitting: Physician Assistant

## 2019-02-01 ENCOUNTER — Other Ambulatory Visit: Payer: Self-pay

## 2019-02-01 ENCOUNTER — Encounter (HOSPITAL_COMMUNITY): Payer: Self-pay

## 2019-02-01 DIAGNOSIS — Z992 Dependence on renal dialysis: Secondary | ICD-10-CM | POA: Diagnosis not present

## 2019-02-01 DIAGNOSIS — Z79899 Other long term (current) drug therapy: Secondary | ICD-10-CM | POA: Diagnosis not present

## 2019-02-01 DIAGNOSIS — I5042 Chronic combined systolic (congestive) and diastolic (congestive) heart failure: Secondary | ICD-10-CM | POA: Diagnosis not present

## 2019-02-01 DIAGNOSIS — N186 End stage renal disease: Secondary | ICD-10-CM | POA: Insufficient documentation

## 2019-02-01 DIAGNOSIS — I252 Old myocardial infarction: Secondary | ICD-10-CM | POA: Insufficient documentation

## 2019-02-01 DIAGNOSIS — I509 Heart failure, unspecified: Secondary | ICD-10-CM | POA: Diagnosis not present

## 2019-02-01 DIAGNOSIS — E1122 Type 2 diabetes mellitus with diabetic chronic kidney disease: Secondary | ICD-10-CM | POA: Insufficient documentation

## 2019-02-01 DIAGNOSIS — I4891 Unspecified atrial fibrillation: Secondary | ICD-10-CM | POA: Diagnosis not present

## 2019-02-01 DIAGNOSIS — T82898A Other specified complication of vascular prosthetic devices, implants and grafts, initial encounter: Secondary | ICD-10-CM | POA: Diagnosis not present

## 2019-02-01 DIAGNOSIS — L98499 Non-pressure chronic ulcer of skin of other sites with unspecified severity: Secondary | ICD-10-CM | POA: Diagnosis not present

## 2019-02-01 DIAGNOSIS — Z905 Acquired absence of kidney: Secondary | ICD-10-CM | POA: Diagnosis not present

## 2019-02-01 DIAGNOSIS — I2510 Atherosclerotic heart disease of native coronary artery without angina pectoris: Secondary | ICD-10-CM | POA: Insufficient documentation

## 2019-02-01 DIAGNOSIS — K219 Gastro-esophageal reflux disease without esophagitis: Secondary | ICD-10-CM | POA: Insufficient documentation

## 2019-02-01 DIAGNOSIS — H5461 Unqualified visual loss, right eye, normal vision left eye: Secondary | ICD-10-CM | POA: Insufficient documentation

## 2019-02-01 DIAGNOSIS — Z85528 Personal history of other malignant neoplasm of kidney: Secondary | ICD-10-CM | POA: Diagnosis not present

## 2019-02-01 DIAGNOSIS — Z8673 Personal history of transient ischemic attack (TIA), and cerebral infarction without residual deficits: Secondary | ICD-10-CM | POA: Insufficient documentation

## 2019-02-01 DIAGNOSIS — Y832 Surgical operation with anastomosis, bypass or graft as the cause of abnormal reaction of the patient, or of later complication, without mention of misadventure at the time of the procedure: Secondary | ICD-10-CM | POA: Diagnosis not present

## 2019-02-01 DIAGNOSIS — I429 Cardiomyopathy, unspecified: Secondary | ICD-10-CM | POA: Insufficient documentation

## 2019-02-01 DIAGNOSIS — Z7901 Long term (current) use of anticoagulants: Secondary | ICD-10-CM | POA: Insufficient documentation

## 2019-02-01 DIAGNOSIS — I132 Hypertensive heart and chronic kidney disease with heart failure and with stage 5 chronic kidney disease, or end stage renal disease: Secondary | ICD-10-CM | POA: Insufficient documentation

## 2019-02-01 DIAGNOSIS — M109 Gout, unspecified: Secondary | ICD-10-CM | POA: Diagnosis not present

## 2019-02-01 HISTORY — DX: Presence of spectacles and contact lenses: Z97.3

## 2019-02-01 HISTORY — PX: REVISON OF ARTERIOVENOUS FISTULA: SHX6074

## 2019-02-01 LAB — POCT I-STAT 4, (NA,K, GLUC, HGB,HCT)
Glucose, Bld: 81 mg/dL (ref 70–99)
HCT: 30 % — ABNORMAL LOW (ref 39.0–52.0)
Hemoglobin: 10.2 g/dL — ABNORMAL LOW (ref 13.0–17.0)
Potassium: 4.8 mmol/L (ref 3.5–5.1)
Sodium: 136 mmol/L (ref 135–145)

## 2019-02-01 LAB — GLUCOSE, CAPILLARY
Glucose-Capillary: 92 mg/dL (ref 70–99)
Glucose-Capillary: 76 mg/dL (ref 70–99)
Glucose-Capillary: 85 mg/dL (ref 70–99)

## 2019-02-01 SURGERY — REVISON OF ARTERIOVENOUS FISTULA
Anesthesia: Regional | Laterality: Left

## 2019-02-01 MED ORDER — PROPOFOL 500 MG/50ML IV EMUL
INTRAVENOUS | Status: DC | PRN
  Administered 2019-02-01: 30 ug/kg/min via INTRAVENOUS

## 2019-02-01 MED ORDER — SODIUM CHLORIDE 0.9 % IV SOLN
INTRAVENOUS | Status: DC
  Administered 2019-02-01: 11:00:00 via INTRAVENOUS

## 2019-02-01 MED ORDER — DEXTROSE 50 % IV SOLN
INTRAVENOUS | Status: DC | PRN
  Administered 2019-02-01: .5 via INTRAVENOUS

## 2019-02-01 MED ORDER — LIDOCAINE 2% (20 MG/ML) 5 ML SYRINGE
INTRAMUSCULAR | Status: AC
  Filled 2019-02-01: qty 5

## 2019-02-01 MED ORDER — HYDROMORPHONE HCL 1 MG/ML IJ SOLN: 0.2500 mg | INTRAMUSCULAR | Status: DC | PRN

## 2019-02-01 MED ORDER — LIDOCAINE-EPINEPHRINE (PF) 1 %-1:200000 IJ SOLN
INTRAMUSCULAR | Status: AC
  Filled 2019-02-01: qty 30

## 2019-02-01 MED ORDER — PHENYLEPHRINE 40 MCG/ML (10ML) SYRINGE FOR IV PUSH (FOR BLOOD PRESSURE SUPPORT)
PREFILLED_SYRINGE | INTRAVENOUS | Status: AC
  Filled 2019-02-01: qty 10

## 2019-02-01 MED ORDER — FENTANYL CITRATE (PF) 100 MCG/2ML IJ SOLN
INTRAMUSCULAR | Status: AC
  Administered 2019-02-01: 50 ug via INTRAVENOUS
  Filled 2019-02-01: qty 2

## 2019-02-01 MED ORDER — OXYCODONE HCL 5 MG/5ML PO SOLN: 5.0000 mg | Freq: Once | ORAL | Status: DC | PRN

## 2019-02-01 MED ORDER — HEPARIN SODIUM (PORCINE) 1000 UNIT/ML IJ SOLN
INTRAMUSCULAR | Status: DC | PRN
  Administered 2019-02-01: 5000 [IU] via INTRAVENOUS

## 2019-02-01 MED ORDER — PROPOFOL 1000 MG/100ML IV EMUL
INTRAVENOUS | Status: AC
  Filled 2019-02-01: qty 100

## 2019-02-01 MED ORDER — OXYCODONE-ACETAMINOPHEN 5-325 MG PO TABS: 1.0000 | ORAL_TABLET | Freq: Four times a day (QID) | ORAL | 0 refills | Status: DC | PRN

## 2019-02-01 MED ORDER — FENTANYL CITRATE (PF) 100 MCG/2ML IJ SOLN
50.0000 ug | Freq: Once | INTRAMUSCULAR | Status: AC
  Administered 2019-02-01: 11:00:00 50 ug via INTRAVENOUS

## 2019-02-01 MED ORDER — ROPIVACAINE HCL 5 MG/ML IJ SOLN
INTRAMUSCULAR | Status: DC | PRN
  Administered 2019-02-01: 20 mL via PERINEURAL

## 2019-02-01 MED ORDER — MIDAZOLAM HCL 2 MG/2ML IJ SOLN
INTRAMUSCULAR | Status: AC
  Administered 2019-02-01: 2 mg via INTRAVENOUS
  Filled 2019-02-01: qty 2

## 2019-02-01 MED ORDER — SODIUM CHLORIDE 0.9 % IV SOLN
INTRAVENOUS | Status: DC | PRN
  Administered 2019-02-01: 500 mL

## 2019-02-01 MED ORDER — PROTAMINE SULFATE 10 MG/ML IV SOLN
INTRAVENOUS | Status: AC
  Filled 2019-02-01: qty 5

## 2019-02-01 MED ORDER — MIDAZOLAM HCL 2 MG/2ML IJ SOLN
INTRAMUSCULAR | Status: AC
  Filled 2019-02-01: qty 2

## 2019-02-01 MED ORDER — DEXTROSE 50 % IV SOLN
INTRAVENOUS | Status: AC
  Filled 2019-02-01: qty 50

## 2019-02-01 MED ORDER — 0.9 % SODIUM CHLORIDE (POUR BTL) OPTIME
TOPICAL | Status: DC | PRN
  Administered 2019-02-01: 1000 mL

## 2019-02-01 MED ORDER — LIDOCAINE HCL (PF) 2 % IJ SOLN
INTRAMUSCULAR | Status: DC | PRN
  Administered 2019-02-01: 100 mg via PERINEURAL

## 2019-02-01 MED ORDER — SODIUM CHLORIDE 0.9 % IV SOLN
INTRAVENOUS | Status: AC
  Filled 2019-02-01: qty 1.2

## 2019-02-01 MED ORDER — FENTANYL CITRATE (PF) 250 MCG/5ML IJ SOLN
INTRAMUSCULAR | Status: DC | PRN
  Administered 2019-02-01 (×2): 25 ug via INTRAVENOUS

## 2019-02-01 MED ORDER — FENTANYL CITRATE (PF) 250 MCG/5ML IJ SOLN
INTRAMUSCULAR | Status: AC
  Filled 2019-02-01: qty 5

## 2019-02-01 MED ORDER — CEFAZOLIN SODIUM-DEXTROSE 2-4 GM/100ML-% IV SOLN
2.0000 g | INTRAVENOUS | Status: AC
  Administered 2019-02-01: 2 g via INTRAVENOUS

## 2019-02-01 MED ORDER — PROMETHAZINE HCL 25 MG/ML IJ SOLN: 6.2500 mg | INTRAMUSCULAR | Status: DC | PRN

## 2019-02-01 MED ORDER — SODIUM CHLORIDE 0.9 % IV SOLN: INTRAVENOUS | Status: DC

## 2019-02-01 MED ORDER — MIDAZOLAM HCL 2 MG/2ML IJ SOLN
2.0000 mg | Freq: Once | INTRAMUSCULAR | Status: AC
  Administered 2019-02-01: 11:00:00 2 mg via INTRAVENOUS

## 2019-02-01 MED ORDER — PHENYLEPHRINE 40 MCG/ML (10ML) SYRINGE FOR IV PUSH (FOR BLOOD PRESSURE SUPPORT)
PREFILLED_SYRINGE | INTRAVENOUS | Status: DC | PRN
  Administered 2019-02-01: 60 ug via INTRAVENOUS

## 2019-02-01 MED ORDER — PROTAMINE SULFATE 10 MG/ML IV SOLN
INTRAVENOUS | Status: DC | PRN
  Administered 2019-02-01 (×2): 10 mg via INTRAVENOUS

## 2019-02-01 MED ORDER — CEFAZOLIN SODIUM-DEXTROSE 2-4 GM/100ML-% IV SOLN
INTRAVENOUS | Status: AC
  Filled 2019-02-01: qty 100

## 2019-02-01 MED ORDER — LIDOCAINE 2% (20 MG/ML) 5 ML SYRINGE
INTRAMUSCULAR | Status: DC | PRN
  Administered 2019-02-01: 20 mg via INTRAVENOUS

## 2019-02-01 MED ORDER — OXYCODONE HCL 5 MG PO TABS: 5.0000 mg | ORAL_TABLET | Freq: Once | ORAL | Status: DC | PRN

## 2019-02-01 SURGICAL SUPPLY — 50 items
ADH SKN CLS APL DERMABOND .7 (GAUZE/BANDAGES/DRESSINGS) ×1
AGENT HMST SPONGE THK3/8 (HEMOSTASIS)
ARMBAND PINK RESTRICT EXTREMIT (MISCELLANEOUS) ×2 IMPLANT
BNDG CMPR 9X4 STRL LF SNTH (GAUZE/BANDAGES/DRESSINGS) ×1
BNDG ELASTIC 4X5.8 VLCR STR LF (GAUZE/BANDAGES/DRESSINGS) ×1 IMPLANT
BNDG ESMARK 4X9 LF (GAUZE/BANDAGES/DRESSINGS) ×1 IMPLANT
CANISTER SUCT 3000ML PPV (MISCELLANEOUS) ×2 IMPLANT
CLIP VESOCCLUDE MED 6/CT (CLIP) ×2 IMPLANT
CLIP VESOCCLUDE SM WIDE 6/CT (CLIP) ×2 IMPLANT
COVER PROBE W GEL 5X96 (DRAPES) IMPLANT
COVER WAND RF STERILE (DRAPES) ×2 IMPLANT
CUFF TOURN SGL QUICK 24 (TOURNIQUET CUFF) ×2
CUFF TRNQT CYL 24X4X16.5-23 (TOURNIQUET CUFF) IMPLANT
DECANTER SPIKE VIAL GLASS SM (MISCELLANEOUS) ×1 IMPLANT
DERMABOND ADVANCED (GAUZE/BANDAGES/DRESSINGS) ×1
DERMABOND ADVANCED .7 DNX12 (GAUZE/BANDAGES/DRESSINGS) ×1 IMPLANT
DRAPE ORTHO SPLIT 77X108 STRL (DRAPES) ×2
DRAPE SURG ORHT 6 SPLT 77X108 (DRAPES) IMPLANT
ELECT CAUTERY BLADE 6.4 (BLADE) ×1 IMPLANT
ELECT REM PT RETURN 9FT ADLT (ELECTROSURGICAL) ×2
ELECTRODE REM PT RTRN 9FT ADLT (ELECTROSURGICAL) ×1 IMPLANT
GLOVE BIO SURGEON STRL SZ7.5 (GLOVE) ×2 IMPLANT
GLOVE BIOGEL PI IND STRL 6.5 (GLOVE) IMPLANT
GLOVE BIOGEL PI IND STRL 8 (GLOVE) ×1 IMPLANT
GLOVE BIOGEL PI INDICATOR 6.5 (GLOVE) ×1
GLOVE BIOGEL PI INDICATOR 8 (GLOVE) ×1
GLOVE SURG SS PI 6.5 STRL IVOR (GLOVE) ×2 IMPLANT
GOWN STRL NON-REIN LRG LVL3 (GOWN DISPOSABLE) ×1 IMPLANT
GOWN STRL REUS W/ TWL LRG LVL3 (GOWN DISPOSABLE) ×2 IMPLANT
GOWN STRL REUS W/ TWL XL LVL3 (GOWN DISPOSABLE) ×2 IMPLANT
GOWN STRL REUS W/TWL LRG LVL3 (GOWN DISPOSABLE) ×6
GOWN STRL REUS W/TWL XL LVL3 (GOWN DISPOSABLE) ×2
HEMOSTAT SPONGE AVITENE ULTRA (HEMOSTASIS) IMPLANT
KIT BASIN OR (CUSTOM PROCEDURE TRAY) ×2 IMPLANT
KIT TURNOVER KIT B (KITS) ×2 IMPLANT
NS IRRIG 1000ML POUR BTL (IV SOLUTION) ×2 IMPLANT
PACK CV ACCESS (CUSTOM PROCEDURE TRAY) ×2 IMPLANT
PAD ARMBOARD 7.5X6 YLW CONV (MISCELLANEOUS) ×4 IMPLANT
PENCIL BUTTON HOLSTER BLD 10FT (ELECTRODE) ×1 IMPLANT
STAPLER VISISTAT 35W (STAPLE) IMPLANT
SUT MNCRL AB 4-0 PS2 18 (SUTURE) ×3 IMPLANT
SUT PROLENE 5 0 C 1 24 (SUTURE) ×2 IMPLANT
SUT PROLENE 6 0 BV (SUTURE) ×1 IMPLANT
SUT PROLENE 7 0 BV 1 (SUTURE) IMPLANT
SUT VIC AB 3-0 SH 27 (SUTURE) ×4
SUT VIC AB 3-0 SH 27X BRD (SUTURE) ×1 IMPLANT
TOWEL GREEN STERILE (TOWEL DISPOSABLE) ×2 IMPLANT
TUBE CONNECTING 12X1/4 (SUCTIONS) ×1 IMPLANT
UNDERPAD 30X30 (UNDERPADS AND DIAPERS) ×2 IMPLANT
WATER STERILE IRR 1000ML POUR (IV SOLUTION) ×2 IMPLANT

## 2019-02-01 NOTE — Discharge Instructions (Signed)
[facility]  Discharge Instructions  AV Fistula or Graft Surgery for Dialysis Access  Please refer to the following instructions for your post-procedure care. Your surgeon or physician assistant will discuss any changes with you.  Activity  You may drive the day following your surgery, if you are comfortable and no longer taking prescription pain medication. Resume full activity as the soreness in your incision resolves.  Bathing/Showering  You may shower after you go home. Keep your incision dry for 48 hours. Do not soak in a bathtub, hot tub, or swim until the incision heals completely. You may not shower if you have a hemodialysis catheter.  Incision Care  Clean your incision with mild soap and water after 48 hours. Pat the area dry with a clean towel. You do not need a bandage unless otherwise instructed. Do not apply any ointments or creams to your incision. You may have skin glue on your incision. Do not peel it off. It will come off on its own in about one week. Your arm may swell a bit after surgery. To reduce swelling use pillows to elevate your arm so it is above your heart. Your doctor will tell you if you need to lightly wrap your arm with an ACE bandage.  Diet  Resume your normal diet. There are not special food restrictions following this procedure. In order to heal from your surgery, it is CRITICAL to get adequate nutrition. Your body requires vitamins, minerals, and protein. Vegetables are the best source of vitamins and minerals. Vegetables also provide the perfect balance of protein. Processed food has little nutritional value, so try to avoid this.  Medications  Resume taking all of your medications. If your incision is causing pain, you may take over-the counter pain relievers such as acetaminophen (Tylenol). If you were prescribed a stronger pain medication, please be aware these medications can cause nausea and constipation. Prevent  nausea by taking the medication with a snack or meal. Avoid constipation by drinking plenty of fluids and eating foods with high amount of fiber, such as fruits, vegetables, and grains.  Do not take Tylenol if you are taking prescription pain medications.  Follow up Your surgeon may want to see you in the office following your access surgery. If so, this will be arranged at the time of your surgery.  Please call us immediately for any of the following conditions:  Increased pain, redness, drainage (pus) from your incision site Fever of 101 degrees or higher Severe or worsening pain at your incision site Hand pain or numbness.  Reduce your risk of vascular disease:  Stop smoking. If you would like help, call [facility] at 1-800-QUIT-NOW 843-075-5682) or [place] at [place] your cholesterol Maintain a desired weight Control your diabetes Keep your blood pressure down  Dialysis  It will take several weeks to several months for your new dialysis access to be ready for use. Your surgeon will determine when it is okay to use it. Your nephrologist will continue to direct your dialysis. You can continue to use your Permcath until your new access is ready for use.   02/01/2019 [name] [id number] Oct 21, 1960  Surgeon(s): [name], MD  Procedure(s): REVISION OF ARTERIOVENOUS FISTULA LEFT ARM   May stick graft on designated area only:  Do NOT stick over incision for 6 weeks. May stick above incision immediately. SEE DIAGRAM.   If you have any questions, please call the office at [phone].

## 2019-02-01 NOTE — Progress Notes (Signed)
Orthopedic Tech Progress Note Patient Details:  RUE TINNEL [birth year] [id number]  Ortho Devices Type of Ortho Device: Arm sling Ortho Device/Splint Location: left Ortho Device/Splint Interventions: Application   Post Interventions Patient Tolerated: Well Instructions Provided: Care of device   [place] 02/01/2019, 2:12 PM

## 2019-02-01 NOTE — Anesthesia Procedure Notes (Addendum)
Anesthesia Regional Block: Supraclavicular block   Pre-Anesthetic Checklist: ,, timeout performed, Correct Patient, Correct Site, Correct Laterality, Correct Procedure, Correct Position, site marked, Risks and benefits discussed,  Surgical consent,  Pre-op evaluation,  At surgeon's request and post-op pain management  Laterality: Left  Prep: Maximum Sterile Barrier Precautions used, chloraprep       Needles:  Injection technique: Single-shot  Needle Type: Echogenic Stimulator Needle     Needle Length: 9cm  Needle Gauge: 22     Additional Needles:   Procedures:,,,, ultrasound used (permanent image in chart),,,,  Narrative:  Start time: 02/01/2019 11:15 AM End time: 02/01/2019 11:22 AM Injection made incrementally with aspirations every 5 mL.  Performed by: Personally  Anesthesiologist: Pervis Hocking, DO  Additional Notes: Monitors applied. No increased pain on injection. No increased resistance to injection. Injection made in 5cc increments. Good needle visualization. Patient tolerated procedure well.

## 2019-02-01 NOTE — OR Nursing (Signed)
Swelling noted to arm after closing.  Dr Carlis Abbott was paged and came back to room.  Pt was reopened and bleeding was controlled.

## 2019-02-01 NOTE — Anesthesia Postprocedure Evaluation (Signed)
Anesthesia Post Note  Patient: [name]  Procedure(s) Performed: REVISION OF ARTERIOVENOUS FISTULA LEFT ARM (Left )     Anesthesia Post Evaluation  Last Vitals:  Vitals:   02/01/19 1328 02/01/19 1344  BP: (!) 156/89 (!) 154/88  Pulse: 73 71  Resp: 14 16  Temp:    SpO2: 99% 100%    Last Pain:  Vitals:   02/01/19 1344  TempSrc:   PainSc: 0-No pain                 Pervis Hocking

## 2019-02-01 NOTE — Anesthesia Procedure Notes (Signed)
Procedure Name: MAC Date/Time: 02/01/2019 11:54 AM Performed by: Mariea Clonts, CRNA Pre-anesthesia Checklist: Patient identified, Emergency Drugs available, Suction available, Patient being monitored and Timeout performed Patient Re-evaluated:Patient Re-evaluated prior to induction Oxygen Delivery Method: Nasal cannula

## 2019-02-01 NOTE — Op Note (Signed)
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  Revision of left arm radiocephalic AV fistula with resection of overlying ulceration and primary repair of fistula  Surgeon: Dr. [name], MD  Assistant: Leontine Locket, PA  Indications: Patient is a 58 year old male who was recently seen in the ED with a thinning segment of skin over his radiocephalic AV fistula in the left forearm with an overlying ulcer and scab and recent bleeding event.  He presents today for aneurysm revision and resection of ulcerated skin after risk and benefits were discussed.  Findings: Tourniquet was inflated on the upper arm after 5000 units of IV heparin was given and a elliptical incision was made over the ulcerated segment just distal to the anastomosis at the wrist.  The ulcer was completely excised.  The underlying AV fistula had to be repaired with two 5-0 Prolene's in interrupted fashion.  Ultimately wound was washed out and closed primarily.  I did not repair the second aneurysmal segment given I thought this would require a catheter and there would be no way to stick the fistula and patient wanted to avoid a catheter.  Anesthesia: Regional  Details: The patient was taken to the operating room after informed consent was obtained.  He was placed on the operating room table in supine position.  His left arm was prepped draped in usual sterile fashion.  Antibiotics were administered.  A prep timeout was performed identify patient procedure and site.  Ultimately I give the patient 5000 units of IV heparin.  I then used a Esmarch retractor and exsanguinated the left arm after placing a sterile cuff on the upper arm.  The cuff was then inflated 250 mmHg.  Ultimately made an elliptical incision over the ulcerated segment of thin skin just above the arterial anastomosis in the distal forearm directly over  the fistula.  I dissected down with Bovie cautery and the underlying fistula was partially mobilized.  I then used Metzenbaum scissors and transected the overlying ulcerated skin off the anterior wall the fistula.  There was one small hole here that was repaired with two 5-0 Prolene's in interrupted fashion.  I had enough skin to pull back together without much tension.  The wound was washed out.  Patient was given 20 mg of protamine.  I then closed the subcutaneous tissue with interrupted 3-0 Vicryl's.  The skin was run closed with 4 Monocryl's.  Dermabond was applied.  I did mark the mid to proximal forearm where the fistula should be safely accessed.  After I left the OR I was called back with concern for hematoma at the incision.  I reopened the incision and there was no overt bleeding, just oozing in the soft tissue.  This was controlled with bovie cautery.  There was no overt hematoma and appeared to be aneurysmal segment of fistula distal to our repair.  The wound was irrigated again.  I closed again with 3-0 vicryl, 4-0 monocryl, and dermabond in the skin.  A soft ace bandage was applied.  He had a good thrill at completion.  He tolerated procedure without any apparent complications and was taken to PACU in stable condition.  Complication: None  Condition: Stable  [name], MD [facility] Office: [phone] Pager: [place]

## 2019-02-01 NOTE — H&P (Signed)
History and Physical Interval Note:  02/01/2019 10:01 AM  [name]  has presented today for surgery, with the diagnosis of FISTULA ANEURYSM.  The various methods of treatment have been discussed with the patient and family. After consideration of risks, benefits and other options for treatment, the patient has consented to  Procedure(s): REVISION OF ARTERIOVENOUS FISTULA LEFT ARM (Left) as a surgical intervention.  The patient's history has been reviewed, patient examined, no change in status, stable for surgery.  I have reviewed the patient's chart and labs.  Questions were answered to the patient's satisfaction.    Left arm AVF revision with plication.  [place]  Reason for Consult: Evaluate scab on left arm AV fistula  Referring Physician: Neurology  MRN #: [id number]  History of Present Illness: This is a 58 y.o. male with multiple medical problems including end-stage renal disease, type 2 diabetes, A. fib on Coumadin that presented to the ED with generalized weakness and hypotension for 3 days. Patient states his blood pressures been as low as the 60Y systolic at home and he does take midodrine. No fevers or chills. No respiratory symptoms. He was evaluated by nephrology and they noted a scab on his left AV fistula and asked vascular surgery to evaluate. Patient states the scab has been there for at least a month or 2. He has had no bleeding event. He thinks it was placed 8 years ago or so. Has been plicated multiple times for aneurysms by Dr. Lawana Chambers, Early , and Fields.      Past Medical History:  Diagnosis Date  . Acute respiratory failure with hypoxia ([place]) 01/13/2015  . Atrial flutter ([place])   . Bile leak, postoperative 03/15/2016  . Biliary dyskinesia 02/12/2016  . 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 Cardiomyopathy   . CHF (congestive heart failure) ([place])   . Coronary artery disease    normal coronaries by 10/10/08 cath,  Cardiac Cath 08-04-12 epic.Dr. Doylene Canard follows  . Diabetes mellitus    pt. states he's borderline diabetic., no longer taking med,- off med. since 2013  . Dialysis patient [facility])    Mon-Wed-Fri([place])- Left AV fistula  . Diverticulitis November 2016   reoccurred in December 2016  . Diverticulitis of intestine without perforation or abscess without bleeding 04/21/2015  . ESRD (end stage renal disease) ([place])   . GERD (gastroesophageal reflux disease)   . Gout   . Hemorrhage of left kidney 01/13/2018  . History of nephrectomy 07/04/2012   History of right nephrectomy in 2002 for renal cell carcinoma   . History of unilateral nephrectomy   . Hypertension   . Low iron   . Myocardial infarction [facility]) ?2006  . Renal cell carcinoma    dialysis- MWF- Dr. Mercy Moore follows.  . Renal insufficiency   . Shortness of breath 05/19/11   "at rest, lying down, w/exertion"  . Stroke [facility]) 02/2011   05/19/11 denies residual  . Umbilical hernia 30/16/01   unrepaired        Past Surgical History:  Procedure Laterality Date  . ANAL FISTULOTOMY  08/15/2018  . AV FISTULA PLACEMENT  03/29/2011   Procedure: ARTERIOVENOUS (AV) FISTULA CREATION; Surgeon: Hinda Lenis, MD; Location: [place]; Service: Vascular; Laterality: Left; LEFT RADIOCEPHALIC , Arteriovenous [id number]([id number])  . CARDIAC CATHETERIZATION  05/21/11  . CARDIAC CATHETERIZATION N/A 03/16/2016   Procedure: Left Heart Cath and Coronary Angiography; Surgeon: Dixie Dials, MD;  Location: [place] CV LAB; Service: Cardiovascular; Laterality: N/A;  . CHOLECYSTECTOMY N/A 02/12/2016   Procedure: LAPAROSCOPIC CHOLECYSTECTOMY WITH INTRAOPERATIVE CHOLANGIOGRAM; Surgeon: Jackolyn Confer, MD; Location: [place]; Service: General; Laterality: N/A;  . COLONOSCOPY WITH PROPOFOL N/A 02/01/2017   Procedure: COLONOSCOPY WITH PROPOFOL; Surgeon: Juanita Craver, MD; Location: [facility]; Service: Endoscopy; Laterality: N/A;  . dialysis cath placed     . ESOPHAGOGASTRODUODENOSCOPY (EGD) WITH PROPOFOL N/A 12/02/2015   Procedure: ESOPHAGOGASTRODUODENOSCOPY (EGD) WITH PROPOFOL; Surgeon: Juanita Craver, MD; Location: [facility]; Service: Endoscopy; Laterality: N/A;  . EVALUATION UNDER ANESTHESIA WITH HEMORRHOIDECTOMY N/A 08/15/2018   Procedure: HEMORRHOID LIGATION/PEXY; Surgeon: Michael Boston, MD; Location: [place]; Service: General; Laterality: N/A;  . FINGER SURGERY     L pinkie finger- ORIF- /w remaining hardware - 1990's   . FISTULOTOMY N/A 08/15/2018   Procedure: SUPERFICIAL ANAL FISTULOTOMY; Surgeon: Michael Boston, MD; Location: [place]; Service: General; Laterality: N/A;  . HEMORRHOID SURGERY    . HERNIA REPAIR N/A 08/30/8117   Umbilical hernia repair  . INSERTION OF DIALYSIS CATHETER N/A 01/27/2016   Procedure: INSERTION OF DIALYSIS CATHETER; Surgeon: Waynetta Sandy, MD; Location: [place]; Service: Vascular; Laterality: N/A;  . INSERTION OF MESH N/A 07/04/2012   Procedure: INSERTION OF MESH; Surgeon: Madilyn Hook, DO; Location: [place]; Service: General; Laterality: N/A;  . IR EMBO ART VEN HEMORR LYMPH EXTRAV INC GUIDE ROADMAPPING  01/13/2018  . IR FLUORO GUIDE CV LINE RIGHT  01/13/2018  . IR IVC FILTER PLMT / S&I /IMG GUID/MOD SED  01/27/2018  . IR RENAL SELECTIVE UNI INC S&I MOD SED  01/13/2018  . IR US GUIDE VASC ACCESS RIGHT  01/13/2018  . KIDNEY CYST REMOVAL  2019  . LAPAROSCOPIC LYSIS OF ADHESIONS N/A 02/12/2016   Procedure: LAPAROSCOPIC LYSIS OF ADHESIONS; Surgeon: Jackolyn Confer, MD; Location: [place]; Service: General; Laterality: N/A;  . LEFT AND RIGHT HEART CATHETERIZATION WITH CORONARY ANGIOGRAM N/A 08/04/2012   Procedure: LEFT AND RIGHT HEART CATHETERIZATION WITH CORONARY ANGIOGRAM; Surgeon: Birdie Riddle, MD; Location: [place] CATH LAB; Service: Cardiovascular; Laterality: N/A;  . NEPHRECTOMY  2000   right  . REVISON OF ARTERIOVENOUS FISTULA Left 06/05/2013   Procedure: REVISON OF LEFT RADIOCEPHALIC ARTERIOVENOUS FISTULA; Surgeon: Conrad Renfrow, MD; Location: [place]; Service: Vascular; Laterality: Left;  . REVISON OF ARTERIOVENOUS FISTULA Left 01/27/2016   Procedure: REVISION OF LEFT UPPER EXTREMITY ARTERIOVENOUS FISTULA; Surgeon: Waynetta Sandy, MD; Location: [place]; Service: Vascular; Laterality: Left;  . REVISON OF ARTERIOVENOUS FISTULA Left 08/03/2016   Procedure: REVISON OF Left arm ARTERIOVENOUS FISTULA; Surgeon: Elam Dutch, MD; Location: [place]; Service: Vascular; Laterality: Left;  . REVISON OF ARTERIOVENOUS FISTULA Left 05/06/2017   Procedure: REVISION PLICATION OF ARTERIOVENOUS FISTULA LEFT ARM; Surgeon: Rosetta Posner, MD; Location: [place]; Service: Vascular; Laterality: Left;  . RIGHT HEART CATHETERIZATION N/A 05/21/2011   Procedure: RIGHT HEART CATH; Surgeon: Birdie Riddle, MD; Location: [facility] CATH LAB; Service: Cardiovascular; Laterality: N/A;  . smashed  1990's   "left pinky; have a plate in there"  . UMBILICAL HERNIA REPAIR N/A 07/04/2012   Procedure: LAPAROSCOPIC UMBILICAL HERNIA; Surgeon: Madilyn Hook, DO; Location: [place]; Service: General; Laterality: N/A;  . US ECHOCARDIOGRAPHY  05/20/11       Allergies  Allergen Reactions  . Ace Inhibitors Itching and Cough          Prior to Admission medications   Medication Sig Start Date End Date Taking? Authorizing Provider  allopurinol (ZYLOPRIM) 100 MG tablet Take 1 tablet (100  mg total) by mouth daily. 11/24/18  Yes Dixie Dials, MD  amiodarone (PACERONE) 200 MG tablet Take 0.5 tablets (100 mg total) by mouth daily. 07/21/17  Yes Regalado, [place] A, MD  calcium acetate (PHOSLO) 667 MG capsule Take 1-3 capsules (667-2,001 mg total) by mouth See admin instructions. Take 2001 mg capsules with meals three times daily and 667 mg capsule with snacks. 11/23/18  Yes Dixie Dials, MD  chlorhexidine (PERIDEX) 0.12 % solution 15 mLs by Mouth Rinse route daily as needed (dryness).  05/29/18  Yes [provider]  ferric citrate (AURYXIA) 1 GM 210 MG(Fe) tablet Take  210 mg by mouth 3 (three) times daily with meals.    Yes [provider]  Lidocaine, Anorectal, (L-M-X 5) 5 % CREA Apply 1 application topically 4 (four) times daily as needed (apply to area up to 6 times daily as needed).  Patient taking differently: Apply 1 application topically See admin instructions. Apply up to six times daily as needed for discomfort 07/09/18  Yes Gareth Morgan, MD  lidocaine-prilocaine (EMLA) cream Apply 1 application topically See admin instructions. Apply topically to port access prior to dialysis - Monday, Wednesday, Friday 04/08/15  Yes [provider]  meclizine (ANTIVERT) 25 MG tablet Take 1 tablet (25 mg total) by mouth 3 (three) times daily as needed for dizziness. 08/01/18  Yes Debbe Odea, MD  midodrine (PROAMATINE) 10 MG tablet 30 mg on the days of dyalisis three times a week, and only takes 10 mg on off dyalisis days.  Patient taking differently: Take 10-30 mg by mouth See admin instructions. 30 mg on the days of dialysis three times a week, and only takes 10 mg on off dialysis days. 07/18/18  Yes Rodriguez-Southworth, Sunday Spillers, PA-C  multivitamin (RENA-VIT) TABS tablet Take 1 tablet by mouth at bedtime.   Yes [provider]  pantoprazole (PROTONIX) 40 MG tablet Take 40 mg by mouth 2 (two) times daily.   Yes [provider]  warfarin (COUMADIN) 2.5 MG tablet Take 3 tablets (7.5 mg total) by mouth daily at 6 PM. 11/23/18  Yes Dixie Dials, MD  nitroGLYCERIN (NITROSTAT) 0.4 MG SL tablet Place 0.4 mg under the tongue every 5 (five) minutes as needed for chest pain.  05/20/15   [provider]  oxyCODONE-acetaminophen (PERCOCET/ROXICET) 5-325 MG tablet Take 2 tablets by mouth every 6 (six) hours as needed for severe pain.  Patient not taking: Reported on 01/18/2019 11/16/18   Hayden Rasmussen, MD  UNABLE TO FIND Out patient physical therapy- vestibular therapy for BPPV 08/01/18   Debbe Odea, MD   Social History         Socioeconomic History  . Marital status: Married    Spouse name: Not on file  . Number of children: Not on file  . Years of education: Not on file  . Highest education level: Not on file  Occupational History  . Occupation: disability  Social Needs  . Financial resource strain: Not hard at all  . Food insecurity    Worry: Never true    Inability: Never true  . Transportation needs    Medical: No    Non-medical: No  Tobacco Use  . Smoking status: Never Smoker  . Smokeless tobacco: Never Used  Substance and Sexual Activity  . Alcohol use: No    Alcohol/week: 0.0 standard drinks  . Drug use: No    Types: Cocaine    Comment: Past hx-none in 20 yrs  . Sexual activity: Yes  Lifestyle  . Physical activity    Days per week: 3 days    Minutes per session: 150+ min  . Stress: Not at all  Relationships  . Social Herbalist on phone: Not on file    Gets together: Not on file    Attends religious service: Not on file    Active member of club or organization: Not on file    Attends meetings of clubs or organizations: Not on file    Relationship status: Not on file  . Intimate partner violence    Fear of current or ex partner: No    Emotionally abused: No    Physically abused: No    Forced sexual activity: No  Other Topics Concern  . Not on file  Social History Narrative  . Not on file        Family History  Problem Relation Age of Onset  . Hypertension Mother   . Kidney disease Mother   ROS: [x]  Positive [ ]  Negative [ ]  All sytems reviewed and are negative  Cardiovascular:  []  chest pain/pressure  []  palpitations  []  SOB lying flat  []  DOE  []  pain in legs while walking  []  pain in legs at rest  []  pain in legs at night  []  non-healing ulcers  []  hx of DVT  []  swelling in legs  Pulmonary:  []  productive cough  []  asthma/wheezing  []  home O2  Neurologic:  []  weakness in []  arms []  legs  []  numbness in []  arms []  legs  []  hx of CVA []  mini stroke   [] difficulty speaking or slurred speech  []  temporary loss of vision in one eye  []  dizziness  Hematologic:  []  hx of cancer  []  bleeding problems  []  problems with blood clotting easily  Endocrine:  []  diabetes []  thyroid disease  GI  []  vomiting blood  []  blood in stool  GU:  []  CKD/renal failure []  HD--[]  M/W/F or []  T/T/S  []  burning with urination  []  blood in urine  Psychiatric:  []  anxiety  []  depression  Musculoskeletal:  []  arthritis  []  joint pain  Integumentary:  []  rashes []  ulcers  Constitutional:  []  fever []  chills  Physical Examination      Vitals:   01/18/19 1430 01/18/19 1515  BP: (!) 90/57 (!) 88/58  Pulse:  79  Resp: 14 13  Temp:    SpO2:  98%  Body mass index is 32.32 kg/m.  General: WDWN in NAD  Gait: Not observed  HENT: WNL, normocephalic  Pulmonary: normal non-labored breathing, without Rales, rhonchi, wheezing  Abdomen: soft, NT/ND, no masses  Vascular Exam/Pulses:  Left radial pulse 1+  Thrill in left arm AVF  Two aneurysms as pictured below. One with scab. No active bleeding.  Musculoskeletal: no muscle wasting or atrophy  Neurologic: A&O X 3; Appropriate Affect ; SENSATION: normal; MOTOR FUNCTION: moving all extremities equally. Speech is fluent/normal   CBC  Labs (Brief)  BMET  Labs (Brief)                                                                            COAGS:  Recent Labs                                                    Non-Invasive Vascular Imaging:  None  ASSESSMENT/PLAN: This is a 58 y.o. male with multiple medical problems including end-stage renal disease, type 2 diabetes, A. fib on Coumadin that presented to the ED with generalized weakness and hypotension for 3 days. Patient states his blood pressures been as low as the 60s at home and he does take  midodrine. He was evaluated by nephrology and they noted a scab on his left AV fistula  There is no OR availability tomorrow or Monday. Given he has had no bleeding events, I think this can be arranged as an outpatient. I will schedule with me on a Thursday which is a nondialysis day. My office will reach out to him to schedule. They should access his fistula way from the scab for now.  [name], MD  [facility]  Office: [phone]  Pager: [phone]

## 2019-02-01 NOTE — Transfer of Care (Signed)
Immediate Anesthesia Transfer of Care Note  Patient: [name]  Procedure(s) Performed: REVISION OF ARTERIOVENOUS FISTULA LEFT ARM (Left )  Patient Location: PACU  Anesthesia Type:MAC combined with regional for post-op pain  Level of Consciousness: awake, alert  and oriented  Airway & Oxygen Therapy: Patient Spontanous Breathing and Patient connected to nasal cannula oxygen  Post-op Assessment: Report given to RN and Post -op Vital signs reviewed and stable  Post vital signs: Reviewed and stable  Last Vitals:  Vitals Value Taken Time  BP 151/88 02/01/19 1314  Temp    Pulse    Resp 21 02/01/19 1316  SpO2    Vitals shown include unvalidated device data.  Last Pain:  Vitals:   02/01/19 1043  TempSrc:   PainSc: 0-No pain         Complications: No apparent anesthesia complications

## 2019-02-02 ENCOUNTER — Encounter (HOSPITAL_COMMUNITY): Payer: Self-pay | Admitting: Vascular Surgery

## 2019-02-02 DIAGNOSIS — N2581 Secondary hyperparathyroidism of renal origin: Secondary | ICD-10-CM | POA: Diagnosis not present

## 2019-02-02 DIAGNOSIS — D631 Anemia in chronic kidney disease: Secondary | ICD-10-CM | POA: Diagnosis not present

## 2019-02-02 DIAGNOSIS — D509 Iron deficiency anemia, unspecified: Secondary | ICD-10-CM | POA: Diagnosis not present

## 2019-02-02 DIAGNOSIS — N186 End stage renal disease: Secondary | ICD-10-CM | POA: Diagnosis not present

## 2019-02-02 DIAGNOSIS — Z992 Dependence on renal dialysis: Secondary | ICD-10-CM | POA: Diagnosis not present

## 2019-02-02 DIAGNOSIS — D689 Coagulation defect, unspecified: Secondary | ICD-10-CM | POA: Diagnosis not present

## 2019-02-05 DIAGNOSIS — D631 Anemia in chronic kidney disease: Secondary | ICD-10-CM | POA: Diagnosis not present

## 2019-02-05 DIAGNOSIS — N2581 Secondary hyperparathyroidism of renal origin: Secondary | ICD-10-CM | POA: Diagnosis not present

## 2019-02-05 DIAGNOSIS — N186 End stage renal disease: Secondary | ICD-10-CM | POA: Diagnosis not present

## 2019-02-05 DIAGNOSIS — D509 Iron deficiency anemia, unspecified: Secondary | ICD-10-CM | POA: Diagnosis not present

## 2019-02-05 DIAGNOSIS — Z992 Dependence on renal dialysis: Secondary | ICD-10-CM | POA: Diagnosis not present

## 2019-02-07 DIAGNOSIS — D631 Anemia in chronic kidney disease: Secondary | ICD-10-CM | POA: Diagnosis not present

## 2019-02-07 DIAGNOSIS — N186 End stage renal disease: Secondary | ICD-10-CM | POA: Diagnosis not present

## 2019-02-07 DIAGNOSIS — D689 Coagulation defect, unspecified: Secondary | ICD-10-CM | POA: Diagnosis not present

## 2019-02-07 DIAGNOSIS — D509 Iron deficiency anemia, unspecified: Secondary | ICD-10-CM | POA: Diagnosis not present

## 2019-02-07 DIAGNOSIS — N2581 Secondary hyperparathyroidism of renal origin: Secondary | ICD-10-CM | POA: Diagnosis not present

## 2019-02-07 DIAGNOSIS — Z992 Dependence on renal dialysis: Secondary | ICD-10-CM | POA: Diagnosis not present

## 2019-02-09 DIAGNOSIS — Z992 Dependence on renal dialysis: Secondary | ICD-10-CM | POA: Diagnosis not present

## 2019-02-09 DIAGNOSIS — D631 Anemia in chronic kidney disease: Secondary | ICD-10-CM | POA: Diagnosis not present

## 2019-02-09 DIAGNOSIS — N186 End stage renal disease: Secondary | ICD-10-CM | POA: Diagnosis not present

## 2019-02-09 DIAGNOSIS — D509 Iron deficiency anemia, unspecified: Secondary | ICD-10-CM | POA: Diagnosis not present

## 2019-02-09 DIAGNOSIS — N2581 Secondary hyperparathyroidism of renal origin: Secondary | ICD-10-CM | POA: Diagnosis not present

## 2019-02-12 DIAGNOSIS — Z992 Dependence on renal dialysis: Secondary | ICD-10-CM | POA: Diagnosis not present

## 2019-02-12 DIAGNOSIS — N186 End stage renal disease: Secondary | ICD-10-CM | POA: Diagnosis not present

## 2019-02-12 DIAGNOSIS — D509 Iron deficiency anemia, unspecified: Secondary | ICD-10-CM | POA: Diagnosis not present

## 2019-02-12 DIAGNOSIS — N2581 Secondary hyperparathyroidism of renal origin: Secondary | ICD-10-CM | POA: Diagnosis not present

## 2019-02-12 DIAGNOSIS — D631 Anemia in chronic kidney disease: Secondary | ICD-10-CM | POA: Diagnosis not present

## 2019-02-14 DIAGNOSIS — N2581 Secondary hyperparathyroidism of renal origin: Secondary | ICD-10-CM | POA: Diagnosis not present

## 2019-02-14 DIAGNOSIS — D631 Anemia in chronic kidney disease: Secondary | ICD-10-CM | POA: Diagnosis not present

## 2019-02-14 DIAGNOSIS — Z992 Dependence on renal dialysis: Secondary | ICD-10-CM | POA: Diagnosis not present

## 2019-02-14 DIAGNOSIS — D689 Coagulation defect, unspecified: Secondary | ICD-10-CM | POA: Diagnosis not present

## 2019-02-14 DIAGNOSIS — D509 Iron deficiency anemia, unspecified: Secondary | ICD-10-CM | POA: Diagnosis not present

## 2019-02-14 DIAGNOSIS — N186 End stage renal disease: Secondary | ICD-10-CM | POA: Diagnosis not present

## 2019-02-15 ENCOUNTER — Ambulatory Visit: Payer: Medicare Other | Admitting: Internal Medicine

## 2019-02-16 DIAGNOSIS — D631 Anemia in chronic kidney disease: Secondary | ICD-10-CM | POA: Diagnosis not present

## 2019-02-16 DIAGNOSIS — D509 Iron deficiency anemia, unspecified: Secondary | ICD-10-CM | POA: Diagnosis not present

## 2019-02-16 DIAGNOSIS — Z992 Dependence on renal dialysis: Secondary | ICD-10-CM | POA: Diagnosis not present

## 2019-02-16 DIAGNOSIS — N2581 Secondary hyperparathyroidism of renal origin: Secondary | ICD-10-CM | POA: Diagnosis not present

## 2019-02-16 DIAGNOSIS — N186 End stage renal disease: Secondary | ICD-10-CM | POA: Diagnosis not present

## 2019-02-19 DIAGNOSIS — D631 Anemia in chronic kidney disease: Secondary | ICD-10-CM | POA: Diagnosis not present

## 2019-02-19 DIAGNOSIS — N186 End stage renal disease: Secondary | ICD-10-CM | POA: Diagnosis not present

## 2019-02-19 DIAGNOSIS — D509 Iron deficiency anemia, unspecified: Secondary | ICD-10-CM | POA: Diagnosis not present

## 2019-02-19 DIAGNOSIS — Z992 Dependence on renal dialysis: Secondary | ICD-10-CM | POA: Diagnosis not present

## 2019-02-19 DIAGNOSIS — N2581 Secondary hyperparathyroidism of renal origin: Secondary | ICD-10-CM | POA: Diagnosis not present

## 2019-02-21 DIAGNOSIS — N2581 Secondary hyperparathyroidism of renal origin: Secondary | ICD-10-CM | POA: Diagnosis not present

## 2019-02-21 DIAGNOSIS — Z992 Dependence on renal dialysis: Secondary | ICD-10-CM | POA: Diagnosis not present

## 2019-02-21 DIAGNOSIS — D689 Coagulation defect, unspecified: Secondary | ICD-10-CM | POA: Diagnosis not present

## 2019-02-21 DIAGNOSIS — D509 Iron deficiency anemia, unspecified: Secondary | ICD-10-CM | POA: Diagnosis not present

## 2019-02-21 DIAGNOSIS — D631 Anemia in chronic kidney disease: Secondary | ICD-10-CM | POA: Diagnosis not present

## 2019-02-21 DIAGNOSIS — N186 End stage renal disease: Secondary | ICD-10-CM | POA: Diagnosis not present

## 2019-02-22 DIAGNOSIS — I120 Hypertensive chronic kidney disease with stage 5 chronic kidney disease or end stage renal disease: Secondary | ICD-10-CM | POA: Diagnosis not present

## 2019-02-22 DIAGNOSIS — N186 End stage renal disease: Secondary | ICD-10-CM | POA: Diagnosis not present

## 2019-02-22 DIAGNOSIS — Z992 Dependence on renal dialysis: Secondary | ICD-10-CM | POA: Diagnosis not present

## 2019-02-23 DIAGNOSIS — D509 Iron deficiency anemia, unspecified: Secondary | ICD-10-CM | POA: Diagnosis not present

## 2019-02-23 DIAGNOSIS — D631 Anemia in chronic kidney disease: Secondary | ICD-10-CM | POA: Diagnosis not present

## 2019-02-23 DIAGNOSIS — Z992 Dependence on renal dialysis: Secondary | ICD-10-CM | POA: Diagnosis not present

## 2019-02-23 DIAGNOSIS — N186 End stage renal disease: Secondary | ICD-10-CM | POA: Diagnosis not present

## 2019-02-23 DIAGNOSIS — N2581 Secondary hyperparathyroidism of renal origin: Secondary | ICD-10-CM | POA: Diagnosis not present

## 2019-02-26 DIAGNOSIS — N2581 Secondary hyperparathyroidism of renal origin: Secondary | ICD-10-CM | POA: Diagnosis not present

## 2019-02-26 DIAGNOSIS — D509 Iron deficiency anemia, unspecified: Secondary | ICD-10-CM | POA: Diagnosis not present

## 2019-02-26 DIAGNOSIS — N186 End stage renal disease: Secondary | ICD-10-CM | POA: Diagnosis not present

## 2019-02-26 DIAGNOSIS — Z992 Dependence on renal dialysis: Secondary | ICD-10-CM | POA: Diagnosis not present

## 2019-02-26 DIAGNOSIS — D631 Anemia in chronic kidney disease: Secondary | ICD-10-CM | POA: Diagnosis not present

## 2019-02-27 ENCOUNTER — Encounter: Payer: Self-pay | Admitting: Internal Medicine

## 2019-02-28 ENCOUNTER — Encounter: Payer: Self-pay | Admitting: Internal Medicine

## 2019-02-28 DIAGNOSIS — D689 Coagulation defect, unspecified: Secondary | ICD-10-CM | POA: Diagnosis not present

## 2019-02-28 DIAGNOSIS — D509 Iron deficiency anemia, unspecified: Secondary | ICD-10-CM | POA: Diagnosis not present

## 2019-02-28 DIAGNOSIS — D631 Anemia in chronic kidney disease: Secondary | ICD-10-CM | POA: Diagnosis not present

## 2019-02-28 DIAGNOSIS — Z992 Dependence on renal dialysis: Secondary | ICD-10-CM | POA: Diagnosis not present

## 2019-02-28 DIAGNOSIS — N2581 Secondary hyperparathyroidism of renal origin: Secondary | ICD-10-CM | POA: Diagnosis not present

## 2019-02-28 DIAGNOSIS — N186 End stage renal disease: Secondary | ICD-10-CM | POA: Diagnosis not present

## 2019-03-01 DIAGNOSIS — R1033 Periumbilical pain: Secondary | ICD-10-CM | POA: Diagnosis not present

## 2019-03-01 DIAGNOSIS — K5732 Diverticulitis of large intestine without perforation or abscess without bleeding: Secondary | ICD-10-CM | POA: Diagnosis not present

## 2019-03-01 DIAGNOSIS — K5730 Diverticulosis of large intestine without perforation or abscess without bleeding: Secondary | ICD-10-CM | POA: Diagnosis not present

## 2019-03-02 DIAGNOSIS — N2581 Secondary hyperparathyroidism of renal origin: Secondary | ICD-10-CM | POA: Diagnosis not present

## 2019-03-02 DIAGNOSIS — D631 Anemia in chronic kidney disease: Secondary | ICD-10-CM | POA: Diagnosis not present

## 2019-03-02 DIAGNOSIS — D509 Iron deficiency anemia, unspecified: Secondary | ICD-10-CM | POA: Diagnosis not present

## 2019-03-02 DIAGNOSIS — Z992 Dependence on renal dialysis: Secondary | ICD-10-CM | POA: Diagnosis not present

## 2019-03-02 DIAGNOSIS — N186 End stage renal disease: Secondary | ICD-10-CM | POA: Diagnosis not present

## 2019-03-05 DIAGNOSIS — Z992 Dependence on renal dialysis: Secondary | ICD-10-CM | POA: Diagnosis not present

## 2019-03-05 DIAGNOSIS — N186 End stage renal disease: Secondary | ICD-10-CM | POA: Diagnosis not present

## 2019-03-05 DIAGNOSIS — D509 Iron deficiency anemia, unspecified: Secondary | ICD-10-CM | POA: Diagnosis not present

## 2019-03-05 DIAGNOSIS — D631 Anemia in chronic kidney disease: Secondary | ICD-10-CM | POA: Diagnosis not present

## 2019-03-05 DIAGNOSIS — N2581 Secondary hyperparathyroidism of renal origin: Secondary | ICD-10-CM | POA: Diagnosis not present

## 2019-03-06 ENCOUNTER — Ambulatory Visit: Payer: Medicare Other | Admitting: Vascular Surgery

## 2019-03-06 ENCOUNTER — Other Ambulatory Visit: Payer: Self-pay

## 2019-03-06 ENCOUNTER — Ambulatory Visit (INDEPENDENT_AMBULATORY_CARE_PROVIDER_SITE_OTHER): Payer: Medicare Other | Admitting: Vascular Surgery

## 2019-03-06 ENCOUNTER — Encounter: Payer: Self-pay | Admitting: *Deleted

## 2019-03-06 ENCOUNTER — Encounter: Payer: Self-pay | Admitting: Family

## 2019-03-06 ENCOUNTER — Encounter: Payer: Self-pay | Admitting: Vascular Surgery

## 2019-03-06 ENCOUNTER — Other Ambulatory Visit: Payer: Self-pay | Admitting: *Deleted

## 2019-03-06 VITALS — BP 93/65 | HR 97 | Temp 98.6°F | Resp 18 | Ht 73.0 in | Wt 251.0 lb

## 2019-03-06 DIAGNOSIS — N186 End stage renal disease: Secondary | ICD-10-CM

## 2019-03-06 DIAGNOSIS — Z992 Dependence on renal dialysis: Secondary | ICD-10-CM

## 2019-03-06 NOTE — Progress Notes (Signed)
Patient name: [name] MRN: [id number] DOB: [birth year] Sex: male  REASON FOR VISIT: Postop check after left radiocephalic AV fistula revision with plication and ulcer repair  HPI: [name] is a 58 y.o. male with multiple medical problems that presents for postop check after revision of left radiocephalic AV fistula with resection of ulcer and repair of fistula on 02/01/2019.  This has healed without issue.  The fistula still has a good thrill.  He is currently dialyzing by them accessing the fistula more proximally in his forearm.  He does have a second ulcer that needs to be addressed but he did not want both of these addressed at the same time to avoid a catheter.  Past Medical History:  Diagnosis Date  . Acute respiratory failure with hypoxia ([place]) 01/13/2015  . Atrial flutter ([place])   . Bile leak, postoperative 03/15/2016  . Biliary dyskinesia 02/12/2016  . 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 Cardiomyopathy   . CHF (congestive heart failure) ([place])   . Coronary artery disease    normal coronaries by 10/10/08 cath, Cardiac Cath 08-04-12 epic.Dr. Doylene Canard follows  . Diabetes mellitus    pt. states he's borderline diabetic., no longer taking med,- off med. since 2013  . Dialysis patient [facility])    Mon-Wed-Fri([place])- Left AV fistula  . Diverticulitis November 2016   reoccurred in December 2016  . Diverticulitis of intestine without perforation or abscess without bleeding 04/21/2015  . ESRD (end stage renal disease) ([place])   . GERD (gastroesophageal reflux disease)   . Gout   . Hemorrhage of left kidney 01/13/2018  . History of nephrectomy 07/04/2012   History of right nephrectomy in 2002 for renal cell carcinoma   . History of unilateral nephrectomy   . Hypertension   . Low iron   . Myocardial infarction [facility]) ?2006  . Renal cell carcinoma    dialysis- MWF- Dr. Mercy Moore follows.  . Renal insufficiency   . Shortness of breath 05/19/11   "at rest, lying down, w/exertion"  . Stroke [facility]) 02/2011   05/19/11 denies residual  . Umbilical hernia 96/29/52   unrepaired  . Wears glasses     Past Surgical History:  Procedure Laterality Date  . ANAL FISTULOTOMY  08/15/2018  . AV FISTULA PLACEMENT  03/29/2011   Procedure: ARTERIOVENOUS (AV) FISTULA CREATION;  Surgeon: Hinda Lenis, MD;  Location: [place];  Service: Vascular;  Laterality: Left;  LEFT RADIOCEPHALIC , Arteriovenous [id number])  . CARDIAC CATHETERIZATION  05/21/11  . CARDIAC CATHETERIZATION N/A 03/16/2016   Procedure: Left Heart Cath and Coronary Angiography;  Surgeon: Dixie Dials, MD;  Location: [place] CV LAB;  Service: Cardiovascular;  Laterality: N/A;  . CHOLECYSTECTOMY N/A 02/12/2016   Procedure: LAPAROSCOPIC CHOLECYSTECTOMY WITH INTRAOPERATIVE CHOLANGIOGRAM;  Surgeon: Jackolyn Confer, MD;  Location: [place];  Service: General;  Laterality: N/A;  . COLONOSCOPY WITH PROPOFOL N/A 02/01/2017   Procedure: COLONOSCOPY WITH PROPOFOL;  Surgeon: Juanita Craver, MD;  [facility];  Service: Endoscopy;  Laterality: N/A;  . dialysis cath placed    . ESOPHAGOGASTRODUODENOSCOPY (EGD) WITH PROPOFOL N/A 12/02/2015   Procedure: ESOPHAGOGASTRODUODENOSCOPY (EGD) WITH PROPOFOL;  Surgeon: Juanita Craver, MD;  [facility];  Service: Endoscopy;  Laterality: N/A;  . EVALUATION UNDER ANESTHESIA WITH HEMORRHOIDECTOMY N/A 08/15/2018   Procedure: HEMORRHOID LIGATION/PEXY;  Surgeon: Michael Boston, MD;  Location: [place];  Service: General;  Laterality: N/A;  . FINGER SURGERY  L pinkie finger- ORIF- /w remaining hardware - 1990's    . FISTULOTOMY N/A 08/15/2018   Procedure: SUPERFICIAL ANAL FISTULOTOMY;  Surgeon: Michael Boston, MD;  Location: [place];  Service: General;  Laterality: N/A;  . HEMORRHOID SURGERY    . HERNIA REPAIR N/A 7/51/7001   Umbilical hernia repair  . INSERTION OF DIALYSIS CATHETER N/A 01/27/2016   Procedure: INSERTION OF DIALYSIS CATHETER;  Surgeon:  Waynetta Sandy, MD;  Location: [place];  Service: Vascular;  Laterality: N/A;  . INSERTION OF MESH N/A 07/04/2012   Procedure: INSERTION OF MESH;  Surgeon: Madilyn Hook, DO;  Location: [place];  Service: General;  Laterality: N/A;  . IR EMBO ART  VEN HEMORR LYMPH EXTRAV  INC GUIDE ROADMAPPING  01/13/2018  . IR FLUORO GUIDE CV LINE RIGHT  01/13/2018  . IR IVC FILTER PLMT / S&I /IMG GUID/MOD SED  01/27/2018  . IR RENAL SELECTIVE  UNI INC S&I MOD SED  01/13/2018  . IR US GUIDE VASC ACCESS RIGHT  01/13/2018  . KIDNEY CYST REMOVAL  2019  . LAPAROSCOPIC LYSIS OF ADHESIONS N/A 02/12/2016   Procedure: LAPAROSCOPIC LYSIS OF ADHESIONS;  Surgeon: Jackolyn Confer, MD;  Location: [place];  Service: General;  Laterality: N/A;  . LEFT AND RIGHT HEART CATHETERIZATION WITH CORONARY ANGIOGRAM N/A 08/04/2012   Procedure: LEFT AND RIGHT HEART CATHETERIZATION WITH CORONARY ANGIOGRAM;  Surgeon: Birdie Riddle, MD;  Location: [place] CATH LAB;  Service: Cardiovascular;  Laterality: N/A;  . NEPHRECTOMY  2000   right  . REVISON OF ARTERIOVENOUS FISTULA Left 06/05/2013   Procedure: REVISON OF LEFT RADIOCEPHALIC  ARTERIOVENOUS FISTULA;  Surgeon: Conrad Newburg, MD;  Location: [place];  Service: Vascular;  Laterality: Left;  . REVISON OF ARTERIOVENOUS FISTULA Left 01/27/2016   Procedure: REVISION OF LEFT UPPER EXTREMITY ARTERIOVENOUS FISTULA;  Surgeon: Waynetta Sandy, MD;  Location: [place];  Service: Vascular;  Laterality: Left;  . REVISON OF ARTERIOVENOUS FISTULA Left 08/03/2016   Procedure: REVISON OF Left arm ARTERIOVENOUS FISTULA;  Surgeon: Elam Dutch, MD;  Location: [place];  Service: Vascular;  Laterality: Left;  . REVISON OF ARTERIOVENOUS FISTULA Left 05/06/2017   Procedure: REVISION PLICATION OF ARTERIOVENOUS FISTULA LEFT ARM;  Surgeon: Rosetta Posner, MD;  Location: [place];  Service: Vascular;  Laterality: Left;  . REVISON OF ARTERIOVENOUS FISTULA Left 02/01/2019   Procedure: REVISION OF ARTERIOVENOUS FISTULA LEFT ARM;   Surgeon: [name], MD;  Location: [place];  Service: Vascular;  Laterality: Left;  . RIGHT HEART CATHETERIZATION N/A 05/21/2011   Procedure: RIGHT HEART CATH;  Surgeon: Birdie Riddle, MD;  Location: [facility] CATH LAB;  Service: Cardiovascular;  Laterality: N/A;  . smashed  1990's   "left pinky; have a plate in there"  . UMBILICAL HERNIA REPAIR N/A 07/04/2012   Procedure: LAPAROSCOPIC UMBILICAL HERNIA;  Surgeon: Madilyn Hook, DO;  Location: [place];  Service: General;  Laterality: N/A;  . US ECHOCARDIOGRAPHY  05/20/11    Family History  Problem Relation Age of Onset  . Hypertension Mother   . Kidney disease Mother     SOCIAL HISTORY: Social History   Tobacco Use  . Smoking status: Never Smoker  . Smokeless tobacco: Never Used  Substance Use Topics  . Alcohol use: No    Alcohol/week: 0.0 standard drinks    Allergies  Allergen Reactions  . Ace Inhibitors Itching, Cough and Other (See Comments)    Current Outpatient Medications  Medication Sig Dispense Refill  . allopurinol (  ZYLOPRIM) 100 MG tablet Take 1 tablet (100 mg total) by mouth daily. 30 tablet 3  . calcium acetate (PHOSLO) 667 MG capsule Take 1-3 capsules (667-2,001 mg total) by mouth See admin instructions. Take 2001 mg capsules with meals three times daily and 667 mg capsule with snacks.    . chlorhexidine (PERIDEX) 0.12 % solution 15 mLs by Mouth Rinse route daily as needed (dryness).     . ferric citrate (AURYXIA) 1 GM 210 MG(Fe) tablet Take 210 mg by mouth 3 (three) times daily with meals.     . Lidocaine, Anorectal, (L-M-X 5) 5 % CREA Apply 1 application topically 4 (four) times daily as needed (apply to area up to 6 times daily as needed). (Patient taking differently: Apply 1 application topically See admin instructions. Apply up to six times daily as needed for discomfort) 1 Tube 0  . lidocaine-prilocaine (EMLA) cream Apply 1 application topically See admin instructions. Apply topically to port access prior to  dialysis - Monday, Wednesday, Friday  12  . loperamide (IMODIUM) 2 MG capsule Take 1 capsule (2 mg total) by mouth as needed for diarrhea or loose stools. 30 capsule 0  . multivitamin (RENA-VIT) TABS tablet Take 1 tablet by mouth at bedtime.    . nitroGLYCERIN (NITROSTAT) 0.4 MG SL tablet Place 0.4 mg under the tongue every 5 (five) minutes as needed for chest pain.   0  . oxyCODONE-acetaminophen (PERCOCET) 5-325 MG tablet Take 1 tablet by mouth every 6 (six) hours as needed for severe pain. 8 tablet 0  . pantoprazole (PROTONIX) 40 MG tablet Take 40 mg by mouth 2 (two) times daily.    Marland Kitchen UNABLE TO FIND Out patient physical therapy- vestibular therapy for BPPV 1 each 0  . warfarin (COUMADIN) 2.5 MG tablet Take 3 tablets (7.5 mg total) by mouth daily at 6 PM.     No current facility-administered medications for this visit.     REVIEW OF SYSTEMS:  [X]  denotes positive finding, [ ]  denotes negative finding Cardiac  Comments:  Chest pain or chest pressure:    Shortness of breath upon exertion:    Short of breath when lying flat:    Irregular heart rhythm:        Vascular    Pain in calf, thigh, or hip brought on by ambulation:    Pain in feet at night that wakes you up from your sleep:     Blood clot in your veins:    Leg swelling:         Pulmonary    Oxygen at home:    Productive cough:     Wheezing:         Neurologic    Sudden weakness in arms or legs:     Sudden numbness in arms or legs:     Sudden onset of difficulty speaking or slurred speech:    Temporary loss of vision in one eye:     Problems with dizziness:         Gastrointestinal    Blood in stool:     Vomited blood:         Genitourinary    Burning when urinating:     Blood in urine:        Psychiatric    Major depression:         Hematologic    Bleeding problems:    Problems with blood clotting too easily:        Skin  Rashes or ulcers:        Constitutional    Fever or chills:      PHYSICAL  EXAM: Vitals:   03/06/19 1153  BP: 93/65  Pulse: 97  Resp: 18  Temp: 98.6 F (37 C)  TempSrc: Temporal  Weight: 251 lb (113.9 kg)  Height: 6\' 1"  (1.854 m)    GENERAL: The patient is a well-nourished male, in no acute distress. The vital signs are documented above. CARDIAC: There is a regular rate and rhythm.  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  This has healed without issue.  Fistula is working well and has a great thrill.  He has a second more proximal ulcer that needs to be repaired now - this is worse since the last time I saw it.  He wanted to avoid addressing both these the same time so we did not have to place a catheter.  We will arrange on a nondialysis day and he is currently dialyzing on Monday Wednesday Friday.   [name], MD [facility] Office: [phone] Pager: [phone]

## 2019-03-07 DIAGNOSIS — E1129 Type 2 diabetes mellitus with other diabetic kidney complication: Secondary | ICD-10-CM | POA: Diagnosis not present

## 2019-03-07 DIAGNOSIS — N2581 Secondary hyperparathyroidism of renal origin: Secondary | ICD-10-CM | POA: Diagnosis not present

## 2019-03-07 DIAGNOSIS — I4892 Unspecified atrial flutter: Secondary | ICD-10-CM | POA: Diagnosis not present

## 2019-03-07 DIAGNOSIS — Z992 Dependence on renal dialysis: Secondary | ICD-10-CM | POA: Diagnosis not present

## 2019-03-07 DIAGNOSIS — D509 Iron deficiency anemia, unspecified: Secondary | ICD-10-CM | POA: Diagnosis not present

## 2019-03-07 DIAGNOSIS — D631 Anemia in chronic kidney disease: Secondary | ICD-10-CM | POA: Diagnosis not present

## 2019-03-07 DIAGNOSIS — N186 End stage renal disease: Secondary | ICD-10-CM | POA: Diagnosis not present

## 2019-03-07 LAB — POCT INR: INR: 1.6 — AB (ref 0.9–1.1)

## 2019-03-07 LAB — PROTIME-INR: Protime: 19.1 — AB (ref 10.0–13.8)

## 2019-03-09 DIAGNOSIS — D631 Anemia in chronic kidney disease: Secondary | ICD-10-CM | POA: Diagnosis not present

## 2019-03-09 DIAGNOSIS — N186 End stage renal disease: Secondary | ICD-10-CM | POA: Diagnosis not present

## 2019-03-09 DIAGNOSIS — D509 Iron deficiency anemia, unspecified: Secondary | ICD-10-CM | POA: Diagnosis not present

## 2019-03-09 DIAGNOSIS — Z992 Dependence on renal dialysis: Secondary | ICD-10-CM | POA: Diagnosis not present

## 2019-03-09 DIAGNOSIS — N2581 Secondary hyperparathyroidism of renal origin: Secondary | ICD-10-CM | POA: Diagnosis not present

## 2019-03-12 DIAGNOSIS — N2581 Secondary hyperparathyroidism of renal origin: Secondary | ICD-10-CM | POA: Diagnosis not present

## 2019-03-12 DIAGNOSIS — D509 Iron deficiency anemia, unspecified: Secondary | ICD-10-CM | POA: Diagnosis not present

## 2019-03-12 DIAGNOSIS — N186 End stage renal disease: Secondary | ICD-10-CM | POA: Diagnosis not present

## 2019-03-12 DIAGNOSIS — Z992 Dependence on renal dialysis: Secondary | ICD-10-CM | POA: Diagnosis not present

## 2019-03-12 DIAGNOSIS — D631 Anemia in chronic kidney disease: Secondary | ICD-10-CM | POA: Diagnosis not present

## 2019-03-13 ENCOUNTER — Encounter: Payer: Self-pay | Admitting: Internal Medicine

## 2019-03-13 ENCOUNTER — Encounter (HOSPITAL_COMMUNITY): Payer: Self-pay | Admitting: *Deleted

## 2019-03-13 ENCOUNTER — Other Ambulatory Visit (HOSPITAL_COMMUNITY)
Admission: RE | Admit: 2019-03-13 | Discharge: 2019-03-13 | Disposition: A | Discharge status: Home / Self Care | Payer: Medicare Other | Source: Ambulatory Visit | Attending: Vascular Surgery | Admitting: Vascular Surgery

## 2019-03-13 DIAGNOSIS — Z01812 Encounter for preprocedural laboratory examination: Secondary | ICD-10-CM | POA: Diagnosis not present

## 2019-03-13 DIAGNOSIS — Z20828 Contact with and (suspected) exposure to other viral communicable diseases: Secondary | ICD-10-CM | POA: Insufficient documentation

## 2019-03-13 LAB — SARS CORONAVIRUS 2 (TAT 6-24 HRS): SARS Coronavirus 2: NEGATIVE

## 2019-03-13 NOTE — Progress Notes (Signed)
Denies chest pain or shob. Reports cardiologist is Dr. Doylene Canard. States compliance with quarantine. Educated on Environmental manager. Last dose of coumadin today.

## 2019-03-14 DIAGNOSIS — N2581 Secondary hyperparathyroidism of renal origin: Secondary | ICD-10-CM | POA: Diagnosis not present

## 2019-03-14 DIAGNOSIS — D631 Anemia in chronic kidney disease: Secondary | ICD-10-CM | POA: Diagnosis not present

## 2019-03-14 DIAGNOSIS — N186 End stage renal disease: Secondary | ICD-10-CM | POA: Diagnosis not present

## 2019-03-14 DIAGNOSIS — D689 Coagulation defect, unspecified: Secondary | ICD-10-CM | POA: Diagnosis not present

## 2019-03-14 DIAGNOSIS — D509 Iron deficiency anemia, unspecified: Secondary | ICD-10-CM | POA: Diagnosis not present

## 2019-03-14 DIAGNOSIS — Z992 Dependence on renal dialysis: Secondary | ICD-10-CM | POA: Diagnosis not present

## 2019-03-14 NOTE — Anesthesia Preprocedure Evaluation (Addendum)
Anesthesia Evaluation  Patient identified by MRN, date of birth, ID band Patient awake    Reviewed: Allergy & Precautions, NPO status , Patient's Chart, lab work & pertinent test results  Airway Mallampati: II  TM Distance: >3 FB Neck ROM: Full    Dental  (+) Dental Advisory Given   Pulmonary neg pulmonary ROS,    breath sounds clear to auscultation       Cardiovascular hypertension, + CAD   Rhythm:Regular Rate:Normal     Neuro/Psych CVA    GI/Hepatic Neg liver ROS, GERD  ,  Endo/Other  diabetes  Renal/GU ESRF and DialysisRenal disease     Musculoskeletal   Abdominal   Peds  Hematology  (+) anemia ,   Anesthesia Other Findings   Reproductive/Obstetrics                            Anesthesia Physical Anesthesia Plan  ASA: III  Anesthesia Plan: MAC   Post-op Pain Management:    Induction: Intravenous  PONV Risk Score and Plan: 1 and Propofol infusion, Ondansetron and Treatment may vary due to age or medical condition  Airway Management Planned: Natural Airway and Simple Face Mask  Additional Equipment:   Intra-op Plan:   Post-operative Plan:   Informed Consent: I have reviewed the patients History and Physical, chart, labs and discussed the procedure including the risks, benefits and alternatives for the proposed anesthesia with the patient or authorized representative who has indicated his/her understanding and acceptance.       Plan Discussed with:   Anesthesia Plan Comments: ( )       Anesthesia Quick Evaluation

## 2019-03-14 NOTE — Progress Notes (Signed)
Anesthesia Chart Review: SAME DAY WORK-UP   Case: [id number] Date/Time: 03/15/19 1045   Procedure: REVISION PLICATION OF ARTERIOVENOUS FISTULA LEFT ARM (Left )   Anesthesia type: Choice   Pre-op diagnosis: pseudoaneurysm left fore arm   Location: [facility] OR ROOM 11 / [place] OR   Surgeon: [name], MD      DISCUSSION: Patient is a 58 year old male scheduled for the above procedure. He is s/p revision of left arm radiocephalic AVF for ulceration on 02/01/19. The second ulceration of his AVF needs to be addressed now.    History includes ESRD (hemodialysis MWF), DM2, HTN, non-ischemic cardiomyopathy (diagnosed ~ 2010), chronic combined systolic and diastolic CHF, questionable history of MI '06, aflutter, CVA '12 (without residual), renal cell carcinoma (s/p right nephrectomy ~ 2002), left renal cyst rupture (2019), GERD, PE/DVT (probable RUL PE 01/26/18; LLE DVT 01/27/18).    Per [facility], last warfarin 03/11/19 for 03/14/19 surgery.     03/13/19 COVID-19 test negative.  He is for labs and anesthesiologist evaluation on the day of surgery.   PROVIDERS: [place], Sunday Spillers, PA-C is PCP Dixie Dials, MD is cardiologist. Currently, last records available are from 01/19/19 during patient's hospital admission for hypotension in setting of diarrhea and known hemodialysis patient. He was treated with IVF.   LABS: ISTAT and PT/INR labs on the day of surgery.    IMAGES: 1V CXR 01/18/19: FINDINGS: - Cardiomegaly and elevated LEFT hemidiaphragm again noted. - There is no evidence of focal airspace disease, pulmonary edema, suspicious pulmonary nodule/mass, pleural effusion, or pneumothorax. - No acute bony abnormalities are identified. IMPRESSION: Cardiomegaly without evidence of acute cardiopulmonary disease.   EKG: 01/18/19: Sinus rhythm Abnormal R-wave progression, late transition LVH with secondary repolarization abnormality Probable lateral infarct, age indeterminate Borderline  prolonged QT interval Confirmed by Nat Christen [phone]) on 01/19/2019 3:00:58 PM   CV: Echo 11/23/18: IMPRESSIONS  1. The left ventricle has moderately reduced systolic function, with an ejection fraction of 35-40%. Left ventricular diffuse hypokinesis.  2. Moderate hypokinesis of the left ventricular, entire inferolateral wall.  3. Left atrial size was moderately dilated.  4. Right atrial size was mildly dilated.  5. The mitral valve is degenerative. Mild thickening of the mitral valve leaflet. There is mild mitral annular calcification present. Mitral valve regurgitation is moderate by color flow Doppler.  6. The aortic valve is tricuspid Mild thickening of the aortic valve Moderate calcification of the aortic valve. No stenosis of the aortic valve. (Comparison 03/17/17: EF 30-35%, diffuse hypokinesis)   Cardiac cath 03/16/16:  Prox LAD lesion, 25 %stenosed.  There is moderate left ventricular systolic dysfunction.  LV end diastolic pressure is normal.  The left ventricular ejection fraction is 25-35% by visual estimate.  There is trivial (1+) mitral regurgitation.    30 day event monitor read in 10/2012 showed NSR, PAC's, occasional PVC's.   [place] 05/21/11:  1. Mild to moderate elevated right heart and wedge pressures.  2. Dilated cardiomyopathy with preserved cardiac output.  - Dr. Doylene Canard recommended resuming Lasix, renal consult, and avoid inotropic medications for now. His Coreg was ultimately reduced by 50%.    Carotid duplex 10/06/12:  No significant extracranial carotid artery stenosis demonstrated. Vertebrals are patent with antegrade flow.   Past Medical History:  Diagnosis Date  . Acute respiratory failure with hypoxia ([place]) 01/13/2015  . Atrial flutter ([place])   . Bile leak, postoperative 03/15/2016  . Biliary dyskinesia 02/12/2016  . Blind right eye    Hx: of partial  blindness in right eye  . Cardiomyopathy   . CHF (congestive heart failure) ([place])   . Coronary  artery disease    normal coronaries by 10/10/08 cath, Cardiac Cath 08-04-12 epic.Dr. Doylene Canard follows  . Diabetes mellitus    pt. states he's borderline diabetic., no longer taking med,- off med. since 2013  . Dialysis patient [facility])    Mon-Wed-Fri([place])- Left AV fistula  . Diverticulitis November 2016   reoccurred in December 2016  . Diverticulitis of intestine without perforation or abscess without bleeding 04/21/2015  . DVT (deep venous thrombosis) ([place])   . ESRD (end stage renal disease) ([place])   . GERD (gastroesophageal reflux disease)   . Gout   . Hemorrhage of left kidney 01/13/2018  . History of nephrectomy 07/04/2012   History of right nephrectomy in 2002 for renal cell carcinoma   . History of unilateral nephrectomy   . Hypertension   . Low iron   . Myocardial infarction [facility]) ?2006  . Renal cell carcinoma    dialysis- MWF- [address]- Dr. Mercy Moore follows.  . Renal insufficiency   . Shortness of breath 05/19/11   "at rest, lying down, w/exertion"  . Stroke [facility]) 02/2011   05/19/11 denies residual  . Umbilical hernia 46/27/03   unrepaired  . Wears glasses     Past Surgical History:  Procedure Laterality Date  . ANAL FISTULOTOMY  08/15/2018  . AV FISTULA PLACEMENT  03/29/2011   Procedure: ARTERIOVENOUS (AV) FISTULA CREATION;  Surgeon: Hinda Lenis, MD;  Location: [place];  Service: Vascular;  Laterality: Left;  LEFT RADIOCEPHALIC , Arteriovenous [id number])  . CARDIAC CATHETERIZATION  05/21/11  . CARDIAC CATHETERIZATION N/A 03/16/2016   Procedure: Left Heart Cath and Coronary Angiography;  Surgeon: Dixie Dials, MD;  Location: [place] CV LAB;  Service: Cardiovascular;  Laterality: N/A;  . CHOLECYSTECTOMY N/A 02/12/2016   Procedure: LAPAROSCOPIC CHOLECYSTECTOMY WITH INTRAOPERATIVE CHOLANGIOGRAM;  Surgeon: Jackolyn Confer, MD;  Location: [place];  Service: General;  Laterality: N/A;  . COLONOSCOPY WITH PROPOFOL N/A 02/01/2017   Procedure:  COLONOSCOPY WITH PROPOFOL;  Surgeon: Juanita Craver, MD;  [facility];  Service: Endoscopy;  Laterality: N/A;  . dialysis cath placed    . ESOPHAGOGASTRODUODENOSCOPY (EGD) WITH PROPOFOL N/A 12/02/2015   Procedure: ESOPHAGOGASTRODUODENOSCOPY (EGD) WITH PROPOFOL;  Surgeon: Juanita Craver, MD;  [facility];  Service: Endoscopy;  Laterality: N/A;  . EVALUATION UNDER ANESTHESIA WITH HEMORRHOIDECTOMY N/A 08/15/2018   Procedure: HEMORRHOID LIGATION/PEXY;  Surgeon: Michael Boston, MD;  Location: [place];  Service: General;  Laterality: N/A;  . FINGER SURGERY     L pinkie finger- ORIF- /w remaining hardware - 1990's    . FISTULOTOMY N/A 08/15/2018   Procedure: SUPERFICIAL ANAL FISTULOTOMY;  Surgeon: Michael Boston, MD;  Location: [place];  Service: General;  Laterality: N/A;  . HEMORRHOID SURGERY    . HERNIA REPAIR N/A 12/06/9676   Umbilical hernia repair  . INSERTION OF DIALYSIS CATHETER N/A 01/27/2016   Procedure: INSERTION OF DIALYSIS CATHETER;  Surgeon: Waynetta Sandy, MD;  Location: [place];  Service: Vascular;  Laterality: N/A;  . INSERTION OF MESH N/A 07/04/2012   Procedure: INSERTION OF MESH;  Surgeon: Madilyn Hook, DO;  Location: [place];  Service: General;  Laterality: N/A;  . IR EMBO ART  VEN HEMORR LYMPH EXTRAV  INC GUIDE ROADMAPPING  01/13/2018  . IR FLUORO GUIDE CV LINE RIGHT  01/13/2018  . IR IVC FILTER PLMT / S&I /IMG GUID/MOD SED  01/27/2018  .  IR RENAL SELECTIVE  UNI INC S&I MOD SED  01/13/2018  . IR US GUIDE VASC ACCESS RIGHT  01/13/2018  . KIDNEY CYST REMOVAL  2019  . LAPAROSCOPIC LYSIS OF ADHESIONS N/A 02/12/2016   Procedure: LAPAROSCOPIC LYSIS OF ADHESIONS;  Surgeon: Jackolyn Confer, MD;  Location: [place];  Service: General;  Laterality: N/A;  . LEFT AND RIGHT HEART CATHETERIZATION WITH CORONARY ANGIOGRAM N/A 08/04/2012   Procedure: LEFT AND RIGHT HEART CATHETERIZATION WITH CORONARY ANGIOGRAM;  Surgeon: Birdie Riddle, MD;  Location: [place] CATH LAB;  Service: Cardiovascular;   Laterality: N/A;  . NEPHRECTOMY  2000   right  . REVISON OF ARTERIOVENOUS FISTULA Left 06/05/2013   Procedure: REVISON OF LEFT RADIOCEPHALIC  ARTERIOVENOUS FISTULA;  Surgeon: Conrad Dahlonega, MD;  Location: [place];  Service: Vascular;  Laterality: Left;  . REVISON OF ARTERIOVENOUS FISTULA Left 01/27/2016   Procedure: REVISION OF LEFT UPPER EXTREMITY ARTERIOVENOUS FISTULA;  Surgeon: Waynetta Sandy, MD;  Location: [place];  Service: Vascular;  Laterality: Left;  . REVISON OF ARTERIOVENOUS FISTULA Left 08/03/2016   Procedure: REVISON OF Left arm ARTERIOVENOUS FISTULA;  Surgeon: Elam Dutch, MD;  Location: [place];  Service: Vascular;  Laterality: Left;  . REVISON OF ARTERIOVENOUS FISTULA Left 05/06/2017   Procedure: REVISION PLICATION OF ARTERIOVENOUS FISTULA LEFT ARM;  Surgeon: Rosetta Posner, MD;  Location: [place];  Service: Vascular;  Laterality: Left;  . REVISON OF ARTERIOVENOUS FISTULA Left 02/01/2019   Procedure: REVISION OF ARTERIOVENOUS FISTULA LEFT ARM;  Surgeon: [name], MD;  Location: [place];  Service: Vascular;  Laterality: Left;  . RIGHT HEART CATHETERIZATION N/A 05/21/2011   Procedure: RIGHT HEART CATH;  Surgeon: Birdie Riddle, MD;  Location: [facility] CATH LAB;  Service: Cardiovascular;  Laterality: N/A;  . smashed  1990's   "left pinky; have a plate in there"  . UMBILICAL HERNIA REPAIR N/A 07/04/2012   Procedure: LAPAROSCOPIC UMBILICAL HERNIA;  Surgeon: Madilyn Hook, DO;  Location: [place];  Service: General;  Laterality: N/A;  . US ECHOCARDIOGRAPHY  05/20/11    MEDICATIONS: No current facility-administered medications for this encounter.    Marland Kitchen allopurinol (ZYLOPRIM) 100 MG tablet  . calcium acetate (PHOSLO) 667 MG capsule  . chlorhexidine (PERIDEX) 0.12 % solution  . ferric citrate (AURYXIA) 1 GM 210 MG(Fe) tablet  . Lidocaine, Anorectal, (L-M-X 5) 5 % CREA  . lidocaine-prilocaine (EMLA) cream  . loperamide (IMODIUM) 2 MG capsule  . multivitamin (RENA-VIT) TABS tablet   . nitroGLYCERIN (NITROSTAT) 0.4 MG SL tablet  . oxyCODONE-acetaminophen (PERCOCET) 5-325 MG tablet  . pantoprazole (PROTONIX) 40 MG tablet  . warfarin (COUMADIN) 2.5 MG tablet  . UNABLE TO FIND     Myra Gianotti, PA-C Surgical Short Stay/Anesthesiology [facility] Phone [phone] [facility] Phone [phone] 03/14/2019 1:14 PM

## 2019-03-15 ENCOUNTER — Ambulatory Visit (HOSPITAL_COMMUNITY): Payer: Medicare Other | Admitting: Vascular Surgery

## 2019-03-15 ENCOUNTER — Other Ambulatory Visit: Payer: Self-pay

## 2019-03-15 ENCOUNTER — Encounter (HOSPITAL_COMMUNITY)
Admission: RE | Disposition: A | Discharge status: Home / Self Care | Payer: Self-pay | Source: Home / Self Care | Attending: Vascular Surgery

## 2019-03-15 ENCOUNTER — Ambulatory Visit (HOSPITAL_COMMUNITY)
Admission: RE | Admit: 2019-03-15 | Discharge: 2019-03-15 | Disposition: A | Discharge status: Home / Self Care | Payer: Medicare Other | Attending: Vascular Surgery | Admitting: Vascular Surgery

## 2019-03-15 ENCOUNTER — Encounter (HOSPITAL_COMMUNITY): Payer: Self-pay

## 2019-03-15 DIAGNOSIS — Z841 Family history of disorders of kidney and ureter: Secondary | ICD-10-CM | POA: Insufficient documentation

## 2019-03-15 DIAGNOSIS — Z992 Dependence on renal dialysis: Secondary | ICD-10-CM | POA: Diagnosis not present

## 2019-03-15 DIAGNOSIS — N186 End stage renal disease: Secondary | ICD-10-CM | POA: Diagnosis not present

## 2019-03-15 DIAGNOSIS — Z85528 Personal history of other malignant neoplasm of kidney: Secondary | ICD-10-CM | POA: Insufficient documentation

## 2019-03-15 DIAGNOSIS — I2510 Atherosclerotic heart disease of native coronary artery without angina pectoris: Secondary | ICD-10-CM | POA: Insufficient documentation

## 2019-03-15 DIAGNOSIS — H5461 Unqualified visual loss, right eye, normal vision left eye: Secondary | ICD-10-CM | POA: Diagnosis not present

## 2019-03-15 DIAGNOSIS — L98499 Non-pressure chronic ulcer of skin of other sites with unspecified severity: Secondary | ICD-10-CM | POA: Diagnosis not present

## 2019-03-15 DIAGNOSIS — I509 Heart failure, unspecified: Secondary | ICD-10-CM | POA: Diagnosis not present

## 2019-03-15 DIAGNOSIS — I252 Old myocardial infarction: Secondary | ICD-10-CM | POA: Diagnosis not present

## 2019-03-15 DIAGNOSIS — E1122 Type 2 diabetes mellitus with diabetic chronic kidney disease: Secondary | ICD-10-CM | POA: Diagnosis not present

## 2019-03-15 DIAGNOSIS — Z9889 Other specified postprocedural states: Secondary | ICD-10-CM | POA: Diagnosis not present

## 2019-03-15 DIAGNOSIS — T82898A Other specified complication of vascular prosthetic devices, implants and grafts, initial encounter: Secondary | ICD-10-CM | POA: Diagnosis not present

## 2019-03-15 DIAGNOSIS — Z8249 Family history of ischemic heart disease and other diseases of the circulatory system: Secondary | ICD-10-CM | POA: Insufficient documentation

## 2019-03-15 DIAGNOSIS — K219 Gastro-esophageal reflux disease without esophagitis: Secondary | ICD-10-CM | POA: Diagnosis not present

## 2019-03-15 DIAGNOSIS — Z86718 Personal history of other venous thrombosis and embolism: Secondary | ICD-10-CM | POA: Insufficient documentation

## 2019-03-15 DIAGNOSIS — I132 Hypertensive heart and chronic kidney disease with heart failure and with stage 5 chronic kidney disease, or end stage renal disease: Secondary | ICD-10-CM | POA: Insufficient documentation

## 2019-03-15 DIAGNOSIS — Z8673 Personal history of transient ischemic attack (TIA), and cerebral infarction without residual deficits: Secondary | ICD-10-CM | POA: Diagnosis not present

## 2019-03-15 DIAGNOSIS — I5042 Chronic combined systolic (congestive) and diastolic (congestive) heart failure: Secondary | ICD-10-CM | POA: Diagnosis not present

## 2019-03-15 DIAGNOSIS — Z905 Acquired absence of kidney: Secondary | ICD-10-CM | POA: Diagnosis not present

## 2019-03-15 DIAGNOSIS — M109 Gout, unspecified: Secondary | ICD-10-CM | POA: Diagnosis not present

## 2019-03-15 DIAGNOSIS — Z7901 Long term (current) use of anticoagulants: Secondary | ICD-10-CM | POA: Insufficient documentation

## 2019-03-15 HISTORY — DX: Acute embolism and thrombosis of unspecified deep veins of unspecified lower extremity: I82.409

## 2019-03-15 HISTORY — PX: REVISON OF ARTERIOVENOUS FISTULA: SHX6074

## 2019-03-15 LAB — POCT I-STAT, CHEM 8
BUN: 38 mg/dL — ABNORMAL HIGH (ref 6–20)
Calcium, Ion: 0.94 mmol/L — ABNORMAL LOW (ref 1.15–1.40)
Chloride: 99 mmol/L (ref 98–111)
Creatinine, Ser: 11.1 mg/dL — ABNORMAL HIGH (ref 0.61–1.24)
Glucose, Bld: 96 mg/dL (ref 70–99)
HCT: 35 % — ABNORMAL LOW (ref 39.0–52.0)
Hemoglobin: 11.9 g/dL — ABNORMAL LOW (ref 13.0–17.0)
Potassium: 4.5 mmol/L (ref 3.5–5.1)
Sodium: 136 mmol/L (ref 135–145)
TCO2: 27 mmol/L (ref 22–32)

## 2019-03-15 LAB — PROTIME-INR
INR: 1.6 — ABNORMAL HIGH (ref 0.8–1.2)
Prothrombin Time: 18.5 seconds — ABNORMAL HIGH (ref 11.4–15.2)
Protime: 26.2 — AB (ref 10.0–13.8)

## 2019-03-15 LAB — GLUCOSE, CAPILLARY
Glucose-Capillary: 94 mg/dL (ref 70–99)
Glucose-Capillary: 66 mg/dL — ABNORMAL LOW (ref 70–99)
Glucose-Capillary: 82 mg/dL (ref 70–99)

## 2019-03-15 LAB — APTT: aPTT: 48 seconds — ABNORMAL HIGH (ref 24–36)

## 2019-03-15 LAB — POCT INR: INR: 2.4 — AB (ref <=1.1)

## 2019-03-15 SURGERY — REVISON OF ARTERIOVENOUS FISTULA
Anesthesia: Monitor Anesthesia Care | Laterality: Left

## 2019-03-15 MED ORDER — ONDANSETRON HCL 4 MG/2ML IJ SOLN
INTRAMUSCULAR | Status: DC | PRN
  Administered 2019-03-15: 4 mg via INTRAVENOUS

## 2019-03-15 MED ORDER — FENTANYL CITRATE (PF) 250 MCG/5ML IJ SOLN
INTRAMUSCULAR | Status: DC | PRN
  Administered 2019-03-15 (×2): 50 ug via INTRAVENOUS
  Administered 2019-03-15 (×2): 25 ug via INTRAVENOUS

## 2019-03-15 MED ORDER — SODIUM CHLORIDE 0.9 % IV SOLN
INTRAVENOUS | Status: DC
  Administered 2019-03-15: 09:00:00 via INTRAVENOUS

## 2019-03-15 MED ORDER — SODIUM CHLORIDE 0.9 % IV SOLN
INTRAVENOUS | Status: DC | PRN
  Administered 2019-03-15: 500 mL

## 2019-03-15 MED ORDER — SODIUM CHLORIDE 0.9 % IV SOLN
INTRAVENOUS | Status: AC
  Filled 2019-03-15: qty 1.2

## 2019-03-15 MED ORDER — LIDOCAINE HCL 1 % IJ SOLN
INTRAMUSCULAR | Status: DC | PRN
  Administered 2019-03-15: 30 mL

## 2019-03-15 MED ORDER — LIDOCAINE 2% (20 MG/ML) 5 ML SYRINGE
INTRAMUSCULAR | Status: DC | PRN
  Administered 2019-03-15: 100 mg via INTRAVENOUS

## 2019-03-15 MED ORDER — HEPARIN SODIUM (PORCINE) 1000 UNIT/ML IJ SOLN
INTRAMUSCULAR | Status: DC | PRN
  Administered 2019-03-15: 3000 [IU] via INTRAVENOUS

## 2019-03-15 MED ORDER — MIDAZOLAM HCL 5 MG/5ML IJ SOLN
INTRAMUSCULAR | Status: DC | PRN
  Administered 2019-03-15: 2 mg via INTRAVENOUS

## 2019-03-15 MED ORDER — PROPOFOL 10 MG/ML IV BOLUS
INTRAVENOUS | Status: DC | PRN
  Administered 2019-03-15 (×4): 10 mg via INTRAVENOUS

## 2019-03-15 MED ORDER — SODIUM CHLORIDE 0.9 % IV SOLN
INTRAVENOUS | Status: DC | PRN
  Administered 2019-03-15: 40 ug/min via INTRAVENOUS

## 2019-03-15 MED ORDER — PROPOFOL 500 MG/50ML IV EMUL
INTRAVENOUS | Status: DC | PRN
  Administered 2019-03-15: 50 ug/kg/min via INTRAVENOUS

## 2019-03-15 MED ORDER — CEFAZOLIN SODIUM-DEXTROSE 2-4 GM/100ML-% IV SOLN
2.0000 g | INTRAVENOUS | Status: AC
  Administered 2019-03-15: 2 g via INTRAVENOUS

## 2019-03-15 MED ORDER — SODIUM CHLORIDE 0.9 % IV SOLN
INTRAVENOUS | Status: DC | PRN
  Administered 2019-03-15: 09:00:00 via INTRAVENOUS

## 2019-03-15 MED ORDER — OXYCODONE-ACETAMINOPHEN 5-325 MG PO TABS: 1.0000 | ORAL_TABLET | Freq: Four times a day (QID) | ORAL | 0 refills | Status: DC | PRN

## 2019-03-15 MED ORDER — 0.9 % SODIUM CHLORIDE (POUR BTL) OPTIME
TOPICAL | Status: DC | PRN
  Administered 2019-03-15: 1000 mL

## 2019-03-15 MED ORDER — LIDOCAINE HCL (PF) 1 % IJ SOLN
INTRAMUSCULAR | Status: AC
  Filled 2019-03-15: qty 30

## 2019-03-15 MED ORDER — MIDAZOLAM HCL 2 MG/2ML IJ SOLN
INTRAMUSCULAR | Status: AC
  Filled 2019-03-15: qty 2

## 2019-03-15 MED ORDER — CEFAZOLIN SODIUM-DEXTROSE 2-4 GM/100ML-% IV SOLN
INTRAVENOUS | Status: AC
  Filled 2019-03-15: qty 100

## 2019-03-15 MED ORDER — FENTANYL CITRATE (PF) 250 MCG/5ML IJ SOLN
INTRAMUSCULAR | Status: AC
  Filled 2019-03-15: qty 5

## 2019-03-15 SURGICAL SUPPLY — 36 items
ADH SKN CLS APL DERMABOND .7 (GAUZE/BANDAGES/DRESSINGS) ×1
AGENT HMST SPONGE THK3/8 (HEMOSTASIS)
ARMBAND PINK RESTRICT EXTREMIT (MISCELLANEOUS) ×2 IMPLANT
CANISTER SUCT 3000ML PPV (MISCELLANEOUS) ×2 IMPLANT
CLIP VESOCCLUDE MED 6/CT (CLIP) ×2 IMPLANT
CLIP VESOCCLUDE SM WIDE 6/CT (CLIP) ×2 IMPLANT
COVER PROBE W GEL 5X96 (DRAPES) IMPLANT
COVER WAND RF STERILE (DRAPES) ×2 IMPLANT
DECANTER SPIKE VIAL GLASS SM (MISCELLANEOUS) ×2 IMPLANT
DERMABOND ADVANCED (GAUZE/BANDAGES/DRESSINGS) ×1
DERMABOND ADVANCED .7 DNX12 (GAUZE/BANDAGES/DRESSINGS) ×1 IMPLANT
ELECT REM PT RETURN 9FT ADLT (ELECTROSURGICAL) ×2
ELECTRODE REM PT RTRN 9FT ADLT (ELECTROSURGICAL) ×1 IMPLANT
GLOVE BIO SURGEON STRL SZ7.5 (GLOVE) ×2 IMPLANT
GLOVE BIOGEL PI IND STRL 8 (GLOVE) ×1 IMPLANT
GLOVE BIOGEL PI INDICATOR 8 (GLOVE) ×1
GOWN STRL REUS W/ TWL LRG LVL3 (GOWN DISPOSABLE) ×2 IMPLANT
GOWN STRL REUS W/ TWL XL LVL3 (GOWN DISPOSABLE) ×2 IMPLANT
GOWN STRL REUS W/TWL LRG LVL3 (GOWN DISPOSABLE) ×4
GOWN STRL REUS W/TWL XL LVL3 (GOWN DISPOSABLE) ×4
HEMOSTAT SPONGE AVITENE ULTRA (HEMOSTASIS) IMPLANT
KIT BASIN OR (CUSTOM PROCEDURE TRAY) ×2 IMPLANT
KIT TURNOVER KIT B (KITS) ×2 IMPLANT
NS IRRIG 1000ML POUR BTL (IV SOLUTION) ×2 IMPLANT
PACK CV ACCESS (CUSTOM PROCEDURE TRAY) ×2 IMPLANT
PAD ARMBOARD 7.5X6 YLW CONV (MISCELLANEOUS) ×4 IMPLANT
STAPLER VISISTAT 35W (STAPLE) IMPLANT
SUT MNCRL AB 4-0 PS2 18 (SUTURE) ×2 IMPLANT
SUT PROLENE 5 0 C 1 24 (SUTURE) ×3 IMPLANT
SUT PROLENE 6 0 BV (SUTURE) IMPLANT
SUT PROLENE 7 0 BV 1 (SUTURE) IMPLANT
SUT VIC AB 3-0 SH 27 (SUTURE) ×2
SUT VIC AB 3-0 SH 27X BRD (SUTURE) ×1 IMPLANT
TOWEL GREEN STERILE (TOWEL DISPOSABLE) ×2 IMPLANT
UNDERPAD 30X30 (UNDERPADS AND DIAPERS) ×2 IMPLANT
WATER STERILE IRR 1000ML POUR (IV SOLUTION) ×2 IMPLANT

## 2019-03-15 NOTE — Discharge Instructions (Signed)
[facility]  Discharge Instructions  AV Fistula or Graft Surgery for Dialysis Access  Please refer to the following instructions for your post-procedure care. Your surgeon or physician assistant will discuss any changes with you.  Activity  You may drive the day following your surgery, if you are comfortable and no longer taking prescription pain medication. Resume full activity as the soreness in your incision resolves.  Bathing/Showering  You may shower after you go home. Keep your incision dry for 48 hours. Do not soak in a bathtub, hot tub, or swim until the incision heals completely. You may not shower if you have a hemodialysis catheter.  Incision Care  Clean your incision with mild soap and water after 48 hours. Pat the area dry with a clean towel. You do not need a bandage unless otherwise instructed. Do not apply any ointments or creams to your incision. You may have skin glue on your incision. Do not peel it off. It will come off on its own in about one week. Your arm may swell a bit after surgery. To reduce swelling use pillows to elevate your arm so it is above your heart. Your doctor will tell you if you need to lightly wrap your arm with an ACE bandage.  Diet  Resume your normal diet. There are not special food restrictions following this procedure. In order to heal from your surgery, it is CRITICAL to get adequate nutrition. Your body requires vitamins, minerals, and protein. Vegetables are the best source of vitamins and minerals. Vegetables also provide the perfect balance of protein. Processed food has little nutritional value, so try to avoid this.  Medications  Resume taking all of your medications. If your incision is causing pain, you may take over-the counter pain relievers such as acetaminophen (Tylenol). If you were prescribed a stronger pain medication, please be aware these medications can cause nausea and constipation. Prevent  nausea by taking the medication with a snack or meal. Avoid constipation by drinking plenty of fluids and eating foods with high amount of fiber, such as fruits, vegetables, and grains.  Do not take Tylenol if you are taking prescription pain medications.  Follow up Your surgeon may want to see you in the office following your access surgery. If so, this will be arranged at the time of your surgery.  Please call us immediately for any of the following conditions:  Increased pain, redness, drainage (pus) from your incision site Fever of 101 degrees or higher Severe or worsening pain at your incision site Hand pain or numbness.  Reduce your risk of vascular disease:  Stop smoking. If you would like help, call [facility] at 1-800-QUIT-NOW (442)596-0167) or [place] at [place] your cholesterol Maintain a desired weight Control your diabetes Keep your blood pressure down  Dialysis  It will take several weeks to several months for your new dialysis access to be ready for use. Your surgeon will determine when it is okay to use it. Your nephrologist will continue to direct your dialysis. You can continue to use your Permcath until your new access is ready for use.   03/15/2019 NAKSH RADI [id number] 24-Dec-1960  Surgeon(s): [name], MD  Procedure(s): REVISION PLICATION OF ARTERIOVENOUS FISTULA LEFT ARM   May stick graft on designated area only:  Do NOT stick over incision x 6 weeks. May stick above and below incision immediately. SEE DIAGRAM.   If you have any questions, please call the office  at [phone].

## 2019-03-15 NOTE — Op Note (Signed)
Date: March 15, 2019  Preoperative diagnosis: Ulcer over left radiocephalic AV fistula (mid forearm)  Postoperative diagnosis: Same  Procedure: 1.  Revision of left arm radiocephalic AV fistula with resection of overlying ulceration and plication  Surgeon: Dr. [name], MD  Assistant: Leontine Locket, PA  Indications: 58 year old male who recently underwent plication of left arm radiocephalic ulceration in the distal forearm.  He has a second lesion in the mid forearm and presents today for plication and resection of this.  We did not treat both lesions at the same time given patient wanted to avoid a catheter.  Findings: Resection of overlying ulcerated segment in the mid forearm over the radiocephalic fistula and underlying aneurysm was plicated with a 5-0 Prolene.  Anesthesia: Regional  Details: The patient was taken to the operating room after informed consent was obtained.  He was placed on the operating room table in supine position.  His left arm was prepped draped in usual sterile fashion.  Antibiotics were administered.  A prep timeout was performed to identify patient procedure and site.    Initially performed an elliptical incision over the ulcer in the mid forearm over the radiocephalic fistula.  Dissected down with Bovie cautery and ultimately the fistula circumferentially mobilized.  Patient was given 1% lidocaine without epinephrine throughout the case and he got about 30 mL.  Once the fistula was completely mobilized including the overlying ulcerated skin patient was given 3000 units IV heparin.  I used fistula clamp proximal and after heparin circulated for 2 minutes.  Use Metzenbaum scissors and the aneurysm was resected off the anterior wall including the ulcerated skin segment.  I then ran the fistula closed with a 5-0 Prolene.  Prior to completion I de-aired everything.  One patch stitch had to be placed with a 5-0 Prolene.  The wound was copiously  irrigated.  Hemostasis was achieved.  Subcutaneous tissue was closed with a 3-0 Vicryl.  Skin was closed with 4-0 Monocryl Dermabond was applied.  There was still a good thrill in the fistula.  Complication: None  Condition: Stable  [name], MD [facility] [place] Office: [phone] Pager: [place]

## 2019-03-15 NOTE — Transfer of Care (Signed)
Immediate Anesthesia Transfer of Care Note  Patient: [name]  Procedure(s) Performed: REVISION PLICATION OF ARTERIOVENOUS FISTULA LEFT ARM (Left )  Patient Location: PACU  Anesthesia Type:MAC  Level of Consciousness: awake, alert  and oriented  Airway & Oxygen Therapy: Patient Spontanous Breathing and Patient connected to face mask oxygen  Post-op Assessment: Report given to RN, Post -op Vital signs reviewed and stable and Patient moving all extremities X 4  Post vital signs: Reviewed and stable  Last Vitals:  Vitals Value Taken Time  BP    Temp    Pulse 50 03/15/19 1143  Resp 15 03/15/19 1144  SpO2 99 % 03/15/19 1143  Vitals shown include unvalidated device data.  Last Pain:  Vitals:   03/15/19 0853  TempSrc:   PainSc: 0-No pain      Patients Stated Pain Goal: 0 ([id number])  Complications: No apparent anesthesia complications

## 2019-03-15 NOTE — H&P (Signed)
History and Physical Interval Note:  03/15/2019 9:40 AM  [name]  has presented today for surgery, with the diagnosis of pseudoaneurysm left fore arm.  The various methods of treatment have been discussed with the patient and family. After consideration of risks, benefits and other options for treatment, the patient has consented to  Procedure(s): REVISION PLICATION OF ARTERIOVENOUS FISTULA LEFT ARM (Left) as a surgical intervention.  The patient's history has been reviewed, patient examined, no change in status, stable for surgery.  I have reviewed the patient's chart and labs.  Questions were answered to the patient's satisfaction.     [name]  Patient name: [name]            MRN: [id number]        DOB: 09/12/1960          Sex: male  REASON FOR VISIT: Postop check after left radiocephalic AV fistula revision with plication and ulcer repair  HPI: [name] is a 58 y.o. male with multiple medical problems that presents for postop check after revision of left radiocephalic AV fistula with resection of ulcer and repair of fistula on 02/01/2019.  This has healed without issue.  The fistula still has a good thrill.  He is currently dialyzing by them accessing the fistula more proximally in his forearm.  He does have a second ulcer that needs to be addressed but he did not want both of these addressed at the same time to avoid a catheter.      Past Medical History:  Diagnosis Date  . Acute respiratory failure with hypoxia ([place]) 01/13/2015  . Atrial flutter ([place])   . Bile leak, postoperative 03/15/2016  . Biliary dyskinesia 02/12/2016  . 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 Cardiomyopathy   . CHF (congestive heart failure) ([place])   . Coronary artery disease    normal coronaries by 10/10/08 cath, Cardiac Cath 08-04-12 epic.Dr. Doylene Canard follows  . Diabetes mellitus    pt. states he's borderline diabetic., no longer taking med,- off med.  since 2013  . Dialysis patient [facility])    Mon-Wed-Fri([place])- Left AV fistula  . Diverticulitis November 2016   reoccurred in December 2016  . Diverticulitis of intestine without perforation or abscess without bleeding 04/21/2015  . ESRD (end stage renal disease) ([place])   . GERD (gastroesophageal reflux disease)   . Gout   . Hemorrhage of left kidney 01/13/2018  . History of nephrectomy 07/04/2012   History of right nephrectomy in 2002 for renal cell carcinoma   . History of unilateral nephrectomy   . Hypertension   . Low iron   . Myocardial infarction [facility]) ?2006  . Renal cell carcinoma    dialysis- MWF- Dr. Mercy Moore follows.  . Renal insufficiency   . Shortness of breath 05/19/11   "at rest, lying down, w/exertion"  . Stroke [facility]) 02/2011   05/19/11 denies residual  . Umbilical hernia 54/27/06   unrepaired  . Wears glasses          Past Surgical History:  Procedure Laterality Date  . ANAL FISTULOTOMY  08/15/2018  . AV FISTULA PLACEMENT  03/29/2011   Procedure: ARTERIOVENOUS (AV) FISTULA CREATION;  Surgeon: Hinda Lenis, MD;  Location: [place];  Service: Vascular;  Laterality: Left;  LEFT RADIOCEPHALIC , Arteriovenous [id number])  . CARDIAC CATHETERIZATION  05/21/11  . CARDIAC CATHETERIZATION N/A 03/16/2016   Procedure: Left Heart Cath and Coronary  Angiography;  Surgeon: Dixie Dials, MD;  Location: [place] CV LAB;  Service: Cardiovascular;  Laterality: N/A;  . CHOLECYSTECTOMY N/A 02/12/2016   Procedure: LAPAROSCOPIC CHOLECYSTECTOMY WITH INTRAOPERATIVE CHOLANGIOGRAM;  Surgeon: Jackolyn Confer, MD;  Location: [place];  Service: General;  Laterality: N/A;  . COLONOSCOPY WITH PROPOFOL N/A 02/01/2017   Procedure: COLONOSCOPY WITH PROPOFOL;  Surgeon: Juanita Craver, MD;  Location: [facility] ENDOSCOPY;  Service: Endoscopy;  Laterality: N/A;  . dialysis cath placed    . ESOPHAGOGASTRODUODENOSCOPY (EGD) WITH PROPOFOL N/A 12/02/2015    Procedure: ESOPHAGOGASTRODUODENOSCOPY (EGD) WITH PROPOFOL;  Surgeon: Juanita Craver, MD;  Location: [facility];  Service: Endoscopy;  Laterality: N/A;  . EVALUATION UNDER ANESTHESIA WITH HEMORRHOIDECTOMY N/A 08/15/2018   Procedure: HEMORRHOID LIGATION/PEXY;  Surgeon: Michael Boston, MD;  Location: [place];  Service: General;  Laterality: N/A;  . FINGER SURGERY     L pinkie finger- ORIF- /w remaining hardware - 1990's    . FISTULOTOMY N/A 08/15/2018   Procedure: SUPERFICIAL ANAL FISTULOTOMY;  Surgeon: Michael Boston, MD;  Location: [place];  Service: General;  Laterality: N/A;  . HEMORRHOID SURGERY    . HERNIA REPAIR N/A 2/53/6644   Umbilical hernia repair  . INSERTION OF DIALYSIS CATHETER N/A 01/27/2016   Procedure: INSERTION OF DIALYSIS CATHETER;  Surgeon: Waynetta Sandy, MD;  Location: [place];  Service: Vascular;  Laterality: N/A;  . INSERTION OF MESH N/A 07/04/2012   Procedure: INSERTION OF MESH;  Surgeon: Madilyn Hook, DO;  Location: [place];  Service: General;  Laterality: N/A;  . IR EMBO ART  VEN HEMORR LYMPH EXTRAV  INC GUIDE ROADMAPPING  01/13/2018  . IR FLUORO GUIDE CV LINE RIGHT  01/13/2018  . IR IVC FILTER PLMT / S&I /IMG GUID/MOD SED  01/27/2018  . IR RENAL SELECTIVE  UNI INC S&I MOD SED  01/13/2018  . IR US GUIDE VASC ACCESS RIGHT  01/13/2018  . KIDNEY CYST REMOVAL  2019  . LAPAROSCOPIC LYSIS OF ADHESIONS N/A 02/12/2016   Procedure: LAPAROSCOPIC LYSIS OF ADHESIONS;  Surgeon: Jackolyn Confer, MD;  Location: [place];  Service: General;  Laterality: N/A;  . LEFT AND RIGHT HEART CATHETERIZATION WITH CORONARY ANGIOGRAM N/A 08/04/2012   Procedure: LEFT AND RIGHT HEART CATHETERIZATION WITH CORONARY ANGIOGRAM;  Surgeon: Birdie Riddle, MD;  Location: [place] CATH LAB;  Service: Cardiovascular;  Laterality: N/A;  . NEPHRECTOMY  2000   right  . REVISON OF ARTERIOVENOUS FISTULA Left 06/05/2013   Procedure: REVISON OF LEFT RADIOCEPHALIC  ARTERIOVENOUS FISTULA;  Surgeon: Conrad Koloa, MD;   Location: [place];  Service: Vascular;  Laterality: Left;  . REVISON OF ARTERIOVENOUS FISTULA Left 01/27/2016   Procedure: REVISION OF LEFT UPPER EXTREMITY ARTERIOVENOUS FISTULA;  Surgeon: Waynetta Sandy, MD;  Location: [place];  Service: Vascular;  Laterality: Left;  . REVISON OF ARTERIOVENOUS FISTULA Left 08/03/2016   Procedure: REVISON OF Left arm ARTERIOVENOUS FISTULA;  Surgeon: Elam Dutch, MD;  Location: [place];  Service: Vascular;  Laterality: Left;  . REVISON OF ARTERIOVENOUS FISTULA Left 05/06/2017   Procedure: REVISION PLICATION OF ARTERIOVENOUS FISTULA LEFT ARM;  Surgeon: Rosetta Posner, MD;  Location: [place];  Service: Vascular;  Laterality: Left;  . REVISON OF ARTERIOVENOUS FISTULA Left 02/01/2019   Procedure: REVISION OF ARTERIOVENOUS FISTULA LEFT ARM;  Surgeon: [name], MD;  Location: [place];  Service: Vascular;  Laterality: Left;  . RIGHT HEART CATHETERIZATION N/A 05/21/2011   Procedure: RIGHT HEART CATH;  Surgeon: Birdie Riddle, MD;  Location: [facility]  CATH LAB;  Service: Cardiovascular;  Laterality: N/A;  . smashed  1990's   "left pinky; have a plate in there"  . UMBILICAL HERNIA REPAIR N/A 07/04/2012   Procedure: LAPAROSCOPIC UMBILICAL HERNIA;  Surgeon: Madilyn Hook, DO;  Location: [place];  Service: General;  Laterality: N/A;  . US ECHOCARDIOGRAPHY  05/20/11         Family History  Problem Relation Age of Onset  . Hypertension Mother   . Kidney disease Mother     SOCIAL HISTORY: Social History   Tobacco Use  . Smoking status: Never Smoker  . Smokeless tobacco: Never Used  Substance Use Topics  . Alcohol use: No    Alcohol/week: 0.0 standard drinks        Allergies  Allergen Reactions  . Ace Inhibitors Itching, Cough and Other (See Comments)          Current Outpatient Medications  Medication Sig Dispense Refill  . allopurinol (ZYLOPRIM) 100 MG tablet Take 1 tablet (100 mg total) by mouth daily. 30 tablet 3  . calcium  acetate (PHOSLO) 667 MG capsule Take 1-3 capsules (667-2,001 mg total) by mouth See admin instructions. Take 2001 mg capsules with meals three times daily and 667 mg capsule with snacks.    . chlorhexidine (PERIDEX) 0.12 % solution 15 mLs by Mouth Rinse route daily as needed (dryness).     . ferric citrate (AURYXIA) 1 GM 210 MG(Fe) tablet Take 210 mg by mouth 3 (three) times daily with meals.     . Lidocaine, Anorectal, (L-M-X 5) 5 % CREA Apply 1 application topically 4 (four) times daily as needed (apply to area up to 6 times daily as needed). (Patient taking differently: Apply 1 application topically See admin instructions. Apply up to six times daily as needed for discomfort) 1 Tube 0  . lidocaine-prilocaine (EMLA) cream Apply 1 application topically See admin instructions. Apply topically to port access prior to dialysis - Monday, Wednesday, Friday  12  . loperamide (IMODIUM) 2 MG capsule Take 1 capsule (2 mg total) by mouth as needed for diarrhea or loose stools. 30 capsule 0  . multivitamin (RENA-VIT) TABS tablet Take 1 tablet by mouth at bedtime.    . nitroGLYCERIN (NITROSTAT) 0.4 MG SL tablet Place 0.4 mg under the tongue every 5 (five) minutes as needed for chest pain.   0  . oxyCODONE-acetaminophen (PERCOCET) 5-325 MG tablet Take 1 tablet by mouth every 6 (six) hours as needed for severe pain. 8 tablet 0  . pantoprazole (PROTONIX) 40 MG tablet Take 40 mg by mouth 2 (two) times daily.    Marland Kitchen UNABLE TO FIND Out patient physical therapy- vestibular therapy for BPPV 1 each 0  . warfarin (COUMADIN) 2.5 MG tablet Take 3 tablets (7.5 mg total) by mouth daily at 6 PM.     No current facility-administered medications for this visit.     REVIEW OF SYSTEMS:  [X]  denotes positive finding, [ ]  denotes negative finding Cardiac  Comments:  Chest pain or chest pressure:    Shortness of breath upon exertion:    Short of breath when lying flat:    Irregular heart rhythm:         Vascular    Pain in calf, thigh, or hip brought on by ambulation:    Pain in feet at night that wakes you up from your sleep:     Blood clot in your veins:    Leg swelling:  Pulmonary    Oxygen at home:    Productive cough:     Wheezing:         Neurologic    Sudden weakness in arms or legs:     Sudden numbness in arms or legs:     Sudden onset of difficulty speaking or slurred speech:    Temporary loss of vision in one eye:     Problems with dizziness:         Gastrointestinal    Blood in stool:     Vomited blood:         Genitourinary    Burning when urinating:     Blood in urine:        Psychiatric    Major depression:         Hematologic    Bleeding problems:    Problems with blood clotting too easily:        Skin    Rashes or ulcers:        Constitutional    Fever or chills:      PHYSICAL EXAM:    Vitals:   03/06/19 1153  BP: 93/65  Pulse: 97  Resp: 18  Temp: 98.6 F (37 C)  TempSrc: Temporal  Weight: 251 lb (113.9 kg)  Height: 6\' 1"  (1.854 m)    GENERAL: The patient is a well-nourished male, in no acute distress. The vital signs are documented above. CARDIAC: There is a regular rate and rhythm.  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  This has healed without issue.  Fistula is working well and has a great thrill.  He has a second more proximal ulcer that needs to be repaired now - this is worse since the last time I saw it.  He wanted to avoid addressing both these the same time so we did not have to place a catheter.  We will  arrange on a nondialysis day and he is currently dialyzing on Monday Wednesday Friday.   [name], MD [facility] [place] Office: [phone] Pager: [phone]

## 2019-03-15 NOTE — Anesthesia Procedure Notes (Signed)
Procedure Name: MAC Date/Time: 03/15/2019 10:30 AM Performed by: Harden Mo, CRNA Pre-anesthesia Checklist: Patient identified, Emergency Drugs available, Suction available and Patient being monitored Patient Re-evaluated:Patient Re-evaluated prior to induction Oxygen Delivery Method: Simple face mask Preoxygenation: Pre-oxygenation with 100% oxygen Induction Type: IV induction Placement Confirmation: positive ETCO2 and breath sounds checked- equal and bilateral Dental Injury: Teeth and Oropharynx as per pre-operative assessment

## 2019-03-16 ENCOUNTER — Encounter (HOSPITAL_COMMUNITY): Payer: Self-pay | Admitting: Vascular Surgery

## 2019-03-16 NOTE — Anesthesia Postprocedure Evaluation (Signed)
Anesthesia Post Note  Patient: [name]  Procedure(s) Performed: REVISION PLICATION OF ARTERIOVENOUS FISTULA LEFT ARM (Left )     Patient location during evaluation: PACU Anesthesia Type: MAC Level of consciousness: awake and alert Pain management: pain level controlled Vital Signs Assessment: post-procedure vital signs reviewed and stable Respiratory status: spontaneous breathing, nonlabored ventilation, respiratory function stable and patient connected to nasal cannula oxygen Cardiovascular status: stable and blood pressure returned to baseline Postop Assessment: no apparent nausea or vomiting Anesthetic complications: no    Last Vitals:  Vitals:   03/15/19 1214 03/15/19 1215  BP:    Pulse: 77 80  Resp: 16 18  Temp:  36.4 C  SpO2: 100% 100%    Last Pain:  Vitals:   03/15/19 1215  TempSrc:   PainSc: 0-No pain                 Tiajuana Amass

## 2019-03-17 DIAGNOSIS — N186 End stage renal disease: Secondary | ICD-10-CM | POA: Diagnosis not present

## 2019-03-17 DIAGNOSIS — D631 Anemia in chronic kidney disease: Secondary | ICD-10-CM | POA: Diagnosis not present

## 2019-03-17 DIAGNOSIS — D509 Iron deficiency anemia, unspecified: Secondary | ICD-10-CM | POA: Diagnosis not present

## 2019-03-17 DIAGNOSIS — Z992 Dependence on renal dialysis: Secondary | ICD-10-CM | POA: Diagnosis not present

## 2019-03-17 DIAGNOSIS — N2581 Secondary hyperparathyroidism of renal origin: Secondary | ICD-10-CM | POA: Diagnosis not present

## 2019-03-19 DIAGNOSIS — N186 End stage renal disease: Secondary | ICD-10-CM | POA: Diagnosis not present

## 2019-03-19 DIAGNOSIS — Z992 Dependence on renal dialysis: Secondary | ICD-10-CM | POA: Diagnosis not present

## 2019-03-19 DIAGNOSIS — D509 Iron deficiency anemia, unspecified: Secondary | ICD-10-CM | POA: Diagnosis not present

## 2019-03-19 DIAGNOSIS — N2581 Secondary hyperparathyroidism of renal origin: Secondary | ICD-10-CM | POA: Diagnosis not present

## 2019-03-19 DIAGNOSIS — D631 Anemia in chronic kidney disease: Secondary | ICD-10-CM | POA: Diagnosis not present

## 2019-03-21 ENCOUNTER — Encounter: Payer: Self-pay | Admitting: Internal Medicine

## 2019-03-21 DIAGNOSIS — N186 End stage renal disease: Secondary | ICD-10-CM | POA: Diagnosis not present

## 2019-03-21 DIAGNOSIS — D509 Iron deficiency anemia, unspecified: Secondary | ICD-10-CM | POA: Diagnosis not present

## 2019-03-21 DIAGNOSIS — N2581 Secondary hyperparathyroidism of renal origin: Secondary | ICD-10-CM | POA: Diagnosis not present

## 2019-03-21 DIAGNOSIS — D689 Coagulation defect, unspecified: Secondary | ICD-10-CM | POA: Diagnosis not present

## 2019-03-21 DIAGNOSIS — Z992 Dependence on renal dialysis: Secondary | ICD-10-CM | POA: Diagnosis not present

## 2019-03-21 DIAGNOSIS — D631 Anemia in chronic kidney disease: Secondary | ICD-10-CM | POA: Diagnosis not present

## 2019-03-23 DIAGNOSIS — Z992 Dependence on renal dialysis: Secondary | ICD-10-CM | POA: Diagnosis not present

## 2019-03-23 DIAGNOSIS — D509 Iron deficiency anemia, unspecified: Secondary | ICD-10-CM | POA: Diagnosis not present

## 2019-03-23 DIAGNOSIS — N2581 Secondary hyperparathyroidism of renal origin: Secondary | ICD-10-CM | POA: Diagnosis not present

## 2019-03-23 DIAGNOSIS — N186 End stage renal disease: Secondary | ICD-10-CM | POA: Diagnosis not present

## 2019-03-23 DIAGNOSIS — D631 Anemia in chronic kidney disease: Secondary | ICD-10-CM | POA: Diagnosis not present

## 2019-03-25 DIAGNOSIS — I120 Hypertensive chronic kidney disease with stage 5 chronic kidney disease or end stage renal disease: Secondary | ICD-10-CM | POA: Diagnosis not present

## 2019-03-25 DIAGNOSIS — N186 End stage renal disease: Secondary | ICD-10-CM | POA: Diagnosis not present

## 2019-03-25 DIAGNOSIS — Z992 Dependence on renal dialysis: Secondary | ICD-10-CM | POA: Diagnosis not present

## 2019-03-26 DIAGNOSIS — N186 End stage renal disease: Secondary | ICD-10-CM | POA: Diagnosis not present

## 2019-03-26 DIAGNOSIS — Z992 Dependence on renal dialysis: Secondary | ICD-10-CM | POA: Diagnosis not present

## 2019-03-26 DIAGNOSIS — D509 Iron deficiency anemia, unspecified: Secondary | ICD-10-CM | POA: Diagnosis not present

## 2019-03-26 DIAGNOSIS — N2581 Secondary hyperparathyroidism of renal origin: Secondary | ICD-10-CM | POA: Diagnosis not present

## 2019-03-26 DIAGNOSIS — D631 Anemia in chronic kidney disease: Secondary | ICD-10-CM | POA: Diagnosis not present

## 2019-03-27 ENCOUNTER — Ambulatory Visit (INDEPENDENT_AMBULATORY_CARE_PROVIDER_SITE_OTHER): Payer: Self-pay | Admitting: Vascular Surgery

## 2019-03-27 ENCOUNTER — Encounter: Payer: Self-pay | Admitting: Vascular Surgery

## 2019-03-27 ENCOUNTER — Encounter: Payer: Self-pay | Admitting: Family

## 2019-03-27 ENCOUNTER — Other Ambulatory Visit: Payer: Self-pay

## 2019-03-27 VITALS — BP 97/65 | HR 103 | Temp 98.1°F | Resp 16 | Ht 73.0 in | Wt 252.0 lb

## 2019-03-27 DIAGNOSIS — Z992 Dependence on renal dialysis: Secondary | ICD-10-CM

## 2019-03-27 DIAGNOSIS — I132 Hypertensive heart and chronic kidney disease with heart failure and with stage 5 chronic kidney disease, or end stage renal disease: Secondary | ICD-10-CM

## 2019-03-27 DIAGNOSIS — N186 End stage renal disease: Secondary | ICD-10-CM

## 2019-03-27 NOTE — Progress Notes (Signed)
Patient name: [name] MRN: [id number] DOB: 06-Sep-1960 Sex: male  REASON FOR VISIT: Postop check after left radiocephalic AV fistula revision with plication and ulcer repair  HPI: [name] is a 58 y.o. male with multiple medical problems that presents for postop check after revision of left radiocephalic AV fistula with resection of second ulcer and repair of fistula on 03/15/2019.  This has healed without issue.  The fistula still has a good thrill.  He is currently dialyzing by them accessing the fistula without issue.  He has a small scab over the first ulcer that we revised on 02/01/19.  States dialysis is sticking around this with his direction.  No bleeding around the scab.  Feels this has been present and stable since surgery.  Past Medical History:  Diagnosis Date  . Acute respiratory failure with hypoxia ([place]) 01/13/2015  . Atrial flutter ([place])   . Bile leak, postoperative 03/15/2016  . Biliary dyskinesia 02/12/2016  . 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 Cardiomyopathy   . CHF (congestive heart failure) ([place])   . Coronary artery disease    normal coronaries by 10/10/08 cath, Cardiac Cath 08-04-12 epic.Dr. Doylene Canard follows  . Diabetes mellitus    pt. states he's borderline diabetic., no longer taking med,- off med. since 2013  . Dialysis patient [facility])    Mon-Wed-Fri([place])- Left AV fistula  . Diverticulitis November 2016   reoccurred in December 2016  . Diverticulitis of intestine without perforation or abscess without bleeding 04/21/2015  . DVT (deep venous thrombosis) ([place])   . ESRD (end stage renal disease) ([place])   . GERD (gastroesophageal reflux disease)   . Gout   . Hemorrhage of left kidney 01/13/2018  . History of nephrectomy 07/04/2012   History of right nephrectomy in 2002 for renal cell carcinoma   . History of unilateral nephrectomy   . Hypertension   . Low iron   . Myocardial infarction [facility]) ?2006  . Renal cell  carcinoma    dialysis- MWF- [address]- Dr. Mercy Moore follows.  . Renal insufficiency   . Shortness of breath 05/19/11   "at rest, lying down, w/exertion"  . Stroke [facility]) 02/2011   05/19/11 denies residual  . Umbilical hernia 77/41/28   unrepaired  . Wears glasses     Past Surgical History:  Procedure Laterality Date  . ANAL FISTULOTOMY  08/15/2018  . AV FISTULA PLACEMENT  03/29/2011   Procedure: ARTERIOVENOUS (AV) FISTULA CREATION;  Surgeon: Hinda Lenis, MD;  Location: [place];  Service: Vascular;  Laterality: Left;  LEFT RADIOCEPHALIC , Arteriovenous NOMVEHM(09470)  . CARDIAC CATHETERIZATION  05/21/11  . CARDIAC CATHETERIZATION N/A 03/16/2016   Procedure: Left Heart Cath and Coronary Angiography;  Surgeon: Dixie Dials, MD;  Location: [place] CV LAB;  Service: Cardiovascular;  Laterality: N/A;  . CHOLECYSTECTOMY N/A 02/12/2016   Procedure: LAPAROSCOPIC CHOLECYSTECTOMY WITH INTRAOPERATIVE CHOLANGIOGRAM;  Surgeon: Jackolyn Confer, MD;  Location: [place];  Service: General;  Laterality: N/A;  . COLONOSCOPY WITH PROPOFOL N/A 02/01/2017   Procedure: COLONOSCOPY WITH PROPOFOL;  Surgeon: Juanita Craver, MD;  Location: [facility] END[facility];  Service: Endoscopy;  Laterality: N/A;  . dialysis cath placed    . ESOPHAGOGASTRODUODENOSCOPY (EGD) WITH PROPOFOL N/A 12/02/2015   Procedure: ESOPHAGOGASTRODUODENOSCOPY (EGD) WITH PROPOFOL;  Surgeon: Juanita Craver, MD;  Location: [facility];  Service: Endoscopy;  Laterality: N/A;  . EVALUATION UNDER ANESTHESIA WITH HEMORRHOIDECTOMY N/A 08/15/2018   Procedure: HEMORRHOID LIGATION/PEXY;  Surgeon: Michael Boston, MD;  Location: [place];  Service: General;  Laterality: N/A;  . FINGER SURGERY     L pinkie finger- ORIF- /w remaining hardware - 1990's    . FISTULOTOMY N/A 08/15/2018   Procedure: SUPERFICIAL ANAL FISTULOTOMY;  Surgeon: Michael Boston, MD;  Location: [place];  Service: General;  Laterality: N/A;  . HEMORRHOID SURGERY    . HERNIA REPAIR N/A 3/84/5364    Umbilical hernia repair  . INSERTION OF DIALYSIS CATHETER N/A 01/27/2016   Procedure: INSERTION OF DIALYSIS CATHETER;  Surgeon: Waynetta Sandy, MD;  Location: [place];  Service: Vascular;  Laterality: N/A;  . INSERTION OF MESH N/A 07/04/2012   Procedure: INSERTION OF MESH;  Surgeon: Madilyn Hook, DO;  Location: [place];  Service: General;  Laterality: N/A;  . IR EMBO ART  VEN HEMORR LYMPH EXTRAV  INC GUIDE ROADMAPPING  01/13/2018  . IR FLUORO GUIDE CV LINE RIGHT  01/13/2018  . IR IVC FILTER PLMT / S&I /IMG GUID/MOD SED  01/27/2018  . IR RENAL SELECTIVE  UNI INC S&I MOD SED  01/13/2018  . IR US GUIDE VASC ACCESS RIGHT  01/13/2018  . KIDNEY CYST REMOVAL  2019  . LAPAROSCOPIC LYSIS OF ADHESIONS N/A 02/12/2016   Procedure: LAPAROSCOPIC LYSIS OF ADHESIONS;  Surgeon: Jackolyn Confer, MD;  Location: [place];  Service: General;  Laterality: N/A;  . LEFT AND RIGHT HEART CATHETERIZATION WITH CORONARY ANGIOGRAM N/A 08/04/2012   Procedure: LEFT AND RIGHT HEART CATHETERIZATION WITH CORONARY ANGIOGRAM;  Surgeon: Birdie Riddle, MD;  Location: [place] CATH LAB;  Service: Cardiovascular;  Laterality: N/A;  . NEPHRECTOMY  2000   right  . REVISON OF ARTERIOVENOUS FISTULA Left 06/05/2013   Procedure: REVISON OF LEFT RADIOCEPHALIC  ARTERIOVENOUS FISTULA;  Surgeon: Conrad Nags [place], MD;  Location: [place];  Service: Vascular;  Laterality: Left;  . REVISON OF ARTERIOVENOUS FISTULA Left 01/27/2016   Procedure: REVISION OF LEFT UPPER EXTREMITY ARTERIOVENOUS FISTULA;  Surgeon: Waynetta Sandy, MD;  Location: [place];  Service: Vascular;  Laterality: Left;  . REVISON OF ARTERIOVENOUS FISTULA Left 08/03/2016   Procedure: REVISON OF Left arm ARTERIOVENOUS FISTULA;  Surgeon: Elam Dutch, MD;  Location: [place];  Service: Vascular;  Laterality: Left;  . REVISON OF ARTERIOVENOUS FISTULA Left 05/06/2017   Procedure: REVISION PLICATION OF ARTERIOVENOUS FISTULA LEFT ARM;  Surgeon: Rosetta Posner, MD;  Location: [place];  Service: Vascular;   Laterality: Left;  . REVISON OF ARTERIOVENOUS FISTULA Left 02/01/2019   Procedure: REVISION OF ARTERIOVENOUS FISTULA LEFT ARM;  Surgeon: [name], MD;  Location: [place];  Service: Vascular;  Laterality: Left;  . REVISON OF ARTERIOVENOUS FISTULA Left 03/15/2019   Procedure: REVISION PLICATION OF ARTERIOVENOUS FISTULA LEFT ARM;  Surgeon: [name], MD;  Location: [place];  Service: Vascular;  Laterality: Left;  . RIGHT HEART CATHETERIZATION N/A 05/21/2011   Procedure: RIGHT HEART CATH;  Surgeon: Birdie Riddle, MD;  Location: [facility] CATH LAB;  Service: Cardiovascular;  Laterality: N/A;  . smashed  1990's   "left pinky; have a plate in there"  . UMBILICAL HERNIA REPAIR N/A 07/04/2012   Procedure: LAPAROSCOPIC UMBILICAL HERNIA;  Surgeon: Madilyn Hook, DO;  Location: [place];  Service: General;  Laterality: N/A;  . US ECHOCARDIOGRAPHY  05/20/11    Family History  Problem Relation Age of Onset  . Hypertension Mother   . Kidney disease Mother     SOCIAL HISTORY: Social History   Tobacco Use  . Smoking  status: Never Smoker  . Smokeless tobacco: Never Used  Substance Use Topics  . Alcohol use: No    Alcohol/week: 0.0 standard drinks    Allergies  Allergen Reactions  . Ace Inhibitors Itching, Cough and Other (See Comments)    Current Outpatient Medications  Medication Sig Dispense Refill  . allopurinol (ZYLOPRIM) 100 MG tablet Take 1 tablet (100 mg total) by mouth daily. 30 tablet 3  . calcium acetate (PHOSLO) 667 MG capsule Take 1-3 capsules (667-2,001 mg total) by mouth See admin instructions. Take 2001 mg capsules with meals three times daily and 667 mg capsule with snacks.    . chlorhexidine (PERIDEX) 0.12 % solution 15 mLs by Mouth Rinse route daily as needed (dryness).     . ferric citrate (AURYXIA) 1 GM 210 MG(Fe) tablet Take 210 mg by mouth 3 (three) times daily with meals.     . Lidocaine, Anorectal, (L-M-X 5) 5 % CREA Apply 1 application topically 4 (four) times  daily as needed (apply to area up to 6 times daily as needed). (Patient taking differently: Apply 1 application topically See admin instructions. Apply up to six times daily as needed for discomfort) 1 Tube 0  . lidocaine-prilocaine (EMLA) cream Apply 1 application topically See admin instructions. Apply topically to port access prior to dialysis - Monday, Wednesday, Friday  12  . loperamide (IMODIUM) 2 MG capsule Take 1 capsule (2 mg total) by mouth as needed for diarrhea or loose stools. 30 capsule 0  . multivitamin (RENA-VIT) TABS tablet Take 1 tablet by mouth at bedtime.    . nitroGLYCERIN (NITROSTAT) 0.4 MG SL tablet Place 0.4 mg under the tongue every 5 (five) minutes as needed for chest pain.   0  . oxyCODONE-acetaminophen (PERCOCET) 5-325 MG tablet Take 1 tablet by mouth every 6 (six) hours as needed for severe pain. 8 tablet 0  . pantoprazole (PROTONIX) 40 MG tablet Take 40 mg by mouth 2 (two) times daily.    [place] Kitchen UNABLE TO FIND Out patient physical therapy- vestibular therapy for BPPV 1 each 0  . warfarin (COUMADIN) 2.5 MG tablet Take 3 tablets (7.5 mg total) by mouth daily at 6 PM.     No current facility-administered medications for this visit.     REVIEW OF SYSTEMS:  [X]  denotes positive finding, [ ]  denotes negative finding Cardiac  Comments:  Chest pain or chest pressure:    Shortness of breath upon exertion:    Short of breath when lying flat:    Irregular heart rhythm:        Vascular    Pain in calf, thigh, or hip brought on by ambulation:    Pain in feet at night that wakes you up from your sleep:     Blood clot in your veins:    Leg swelling:         Pulmonary    Oxygen at home:    Productive cough:     Wheezing:         Neurologic    Sudden weakness in arms or legs:     Sudden numbness in arms or legs:     Sudden onset of difficulty speaking or slurred speech:    Temporary loss of vision in one eye:     Problems with dizziness:         Gastrointestinal     Blood in stool:     Vomited blood:         Genitourinary  Burning when urinating:     Blood in urine:        Psychiatric    Major depression:         Hematologic    Bleeding problems:    Problems with blood clotting too easily:        Skin    Rashes or ulcers:        Constitutional    Fever or chills:      PHYSICAL EXAM: Vitals:   03/27/19 1127  BP: 97/65  Pulse: (!) 103  Resp: 16  Temp: 98.1 F (36.7 C)  TempSrc: Temporal  SpO2: 98%  Weight: 252 lb (114.3 kg)  Height: 6\' 1"  (1.854 m)    GENERAL: The patient is a well-nourished male, in no acute distress. The vital signs are documented above. CARDIAC: There is a regular rate and rhythm.  VASCULAR:  Left radiocephalic fistula good thrill Two plicated segments with incisions that are healing. Small scab over distal forearm segment from initial plication  DATA:   None  Assessment/Plan:  58 year old male presents for postop check after left radiocephalic AV fistula repair with aneurysm plication and resection of overlying ulcer x2 (first on 02/01/19 and second on 03/15/19).  This was done in two stages to avoid a catheter at his request. Fistula is working well and has a great thrill.  He has a small scab over the first plicated area on the distal forearm that we are watching.  Will have him follow-up again in one month for wound check.  They will continue to stick away from this area.     [name], MD [facility] Office: [phone] Pager: [phone]

## 2019-03-28 DIAGNOSIS — Z992 Dependence on renal dialysis: Secondary | ICD-10-CM | POA: Diagnosis not present

## 2019-03-28 DIAGNOSIS — N186 End stage renal disease: Secondary | ICD-10-CM | POA: Diagnosis not present

## 2019-03-28 DIAGNOSIS — N2581 Secondary hyperparathyroidism of renal origin: Secondary | ICD-10-CM | POA: Diagnosis not present

## 2019-03-28 DIAGNOSIS — D509 Iron deficiency anemia, unspecified: Secondary | ICD-10-CM | POA: Diagnosis not present

## 2019-03-28 DIAGNOSIS — D631 Anemia in chronic kidney disease: Secondary | ICD-10-CM | POA: Diagnosis not present

## 2019-03-30 DIAGNOSIS — N186 End stage renal disease: Secondary | ICD-10-CM | POA: Diagnosis not present

## 2019-03-30 DIAGNOSIS — Z992 Dependence on renal dialysis: Secondary | ICD-10-CM | POA: Diagnosis not present

## 2019-03-30 DIAGNOSIS — N2581 Secondary hyperparathyroidism of renal origin: Secondary | ICD-10-CM | POA: Diagnosis not present

## 2019-03-30 DIAGNOSIS — D509 Iron deficiency anemia, unspecified: Secondary | ICD-10-CM | POA: Diagnosis not present

## 2019-03-30 DIAGNOSIS — I4892 Unspecified atrial flutter: Secondary | ICD-10-CM | POA: Diagnosis not present

## 2019-03-30 DIAGNOSIS — D631 Anemia in chronic kidney disease: Secondary | ICD-10-CM | POA: Diagnosis not present

## 2019-04-02 DIAGNOSIS — D631 Anemia in chronic kidney disease: Secondary | ICD-10-CM | POA: Diagnosis not present

## 2019-04-02 DIAGNOSIS — Z992 Dependence on renal dialysis: Secondary | ICD-10-CM | POA: Diagnosis not present

## 2019-04-02 DIAGNOSIS — N2581 Secondary hyperparathyroidism of renal origin: Secondary | ICD-10-CM | POA: Diagnosis not present

## 2019-04-02 DIAGNOSIS — D509 Iron deficiency anemia, unspecified: Secondary | ICD-10-CM | POA: Diagnosis not present

## 2019-04-02 DIAGNOSIS — N186 End stage renal disease: Secondary | ICD-10-CM | POA: Diagnosis not present

## 2019-04-04 DIAGNOSIS — D509 Iron deficiency anemia, unspecified: Secondary | ICD-10-CM | POA: Diagnosis not present

## 2019-04-04 DIAGNOSIS — D631 Anemia in chronic kidney disease: Secondary | ICD-10-CM | POA: Diagnosis not present

## 2019-04-04 DIAGNOSIS — N2581 Secondary hyperparathyroidism of renal origin: Secondary | ICD-10-CM | POA: Diagnosis not present

## 2019-04-04 DIAGNOSIS — Z992 Dependence on renal dialysis: Secondary | ICD-10-CM | POA: Diagnosis not present

## 2019-04-04 DIAGNOSIS — N186 End stage renal disease: Secondary | ICD-10-CM | POA: Diagnosis not present

## 2019-04-04 DIAGNOSIS — D689 Coagulation defect, unspecified: Secondary | ICD-10-CM | POA: Diagnosis not present

## 2019-04-06 DIAGNOSIS — N186 End stage renal disease: Secondary | ICD-10-CM | POA: Diagnosis not present

## 2019-04-06 DIAGNOSIS — N2581 Secondary hyperparathyroidism of renal origin: Secondary | ICD-10-CM | POA: Diagnosis not present

## 2019-04-06 DIAGNOSIS — D631 Anemia in chronic kidney disease: Secondary | ICD-10-CM | POA: Diagnosis not present

## 2019-04-06 DIAGNOSIS — D509 Iron deficiency anemia, unspecified: Secondary | ICD-10-CM | POA: Diagnosis not present

## 2019-04-06 DIAGNOSIS — Z992 Dependence on renal dialysis: Secondary | ICD-10-CM | POA: Diagnosis not present

## 2019-04-07 IMAGING — CR CHEST - 2 VIEW
2 series · 2 of 2 positions shown · non-contrast
Comparison: 01/27/2018

CLINICAL DATA: Shortness of breath and dizziness

EXAM:
CHEST - 2 VIEW

[chest lat]
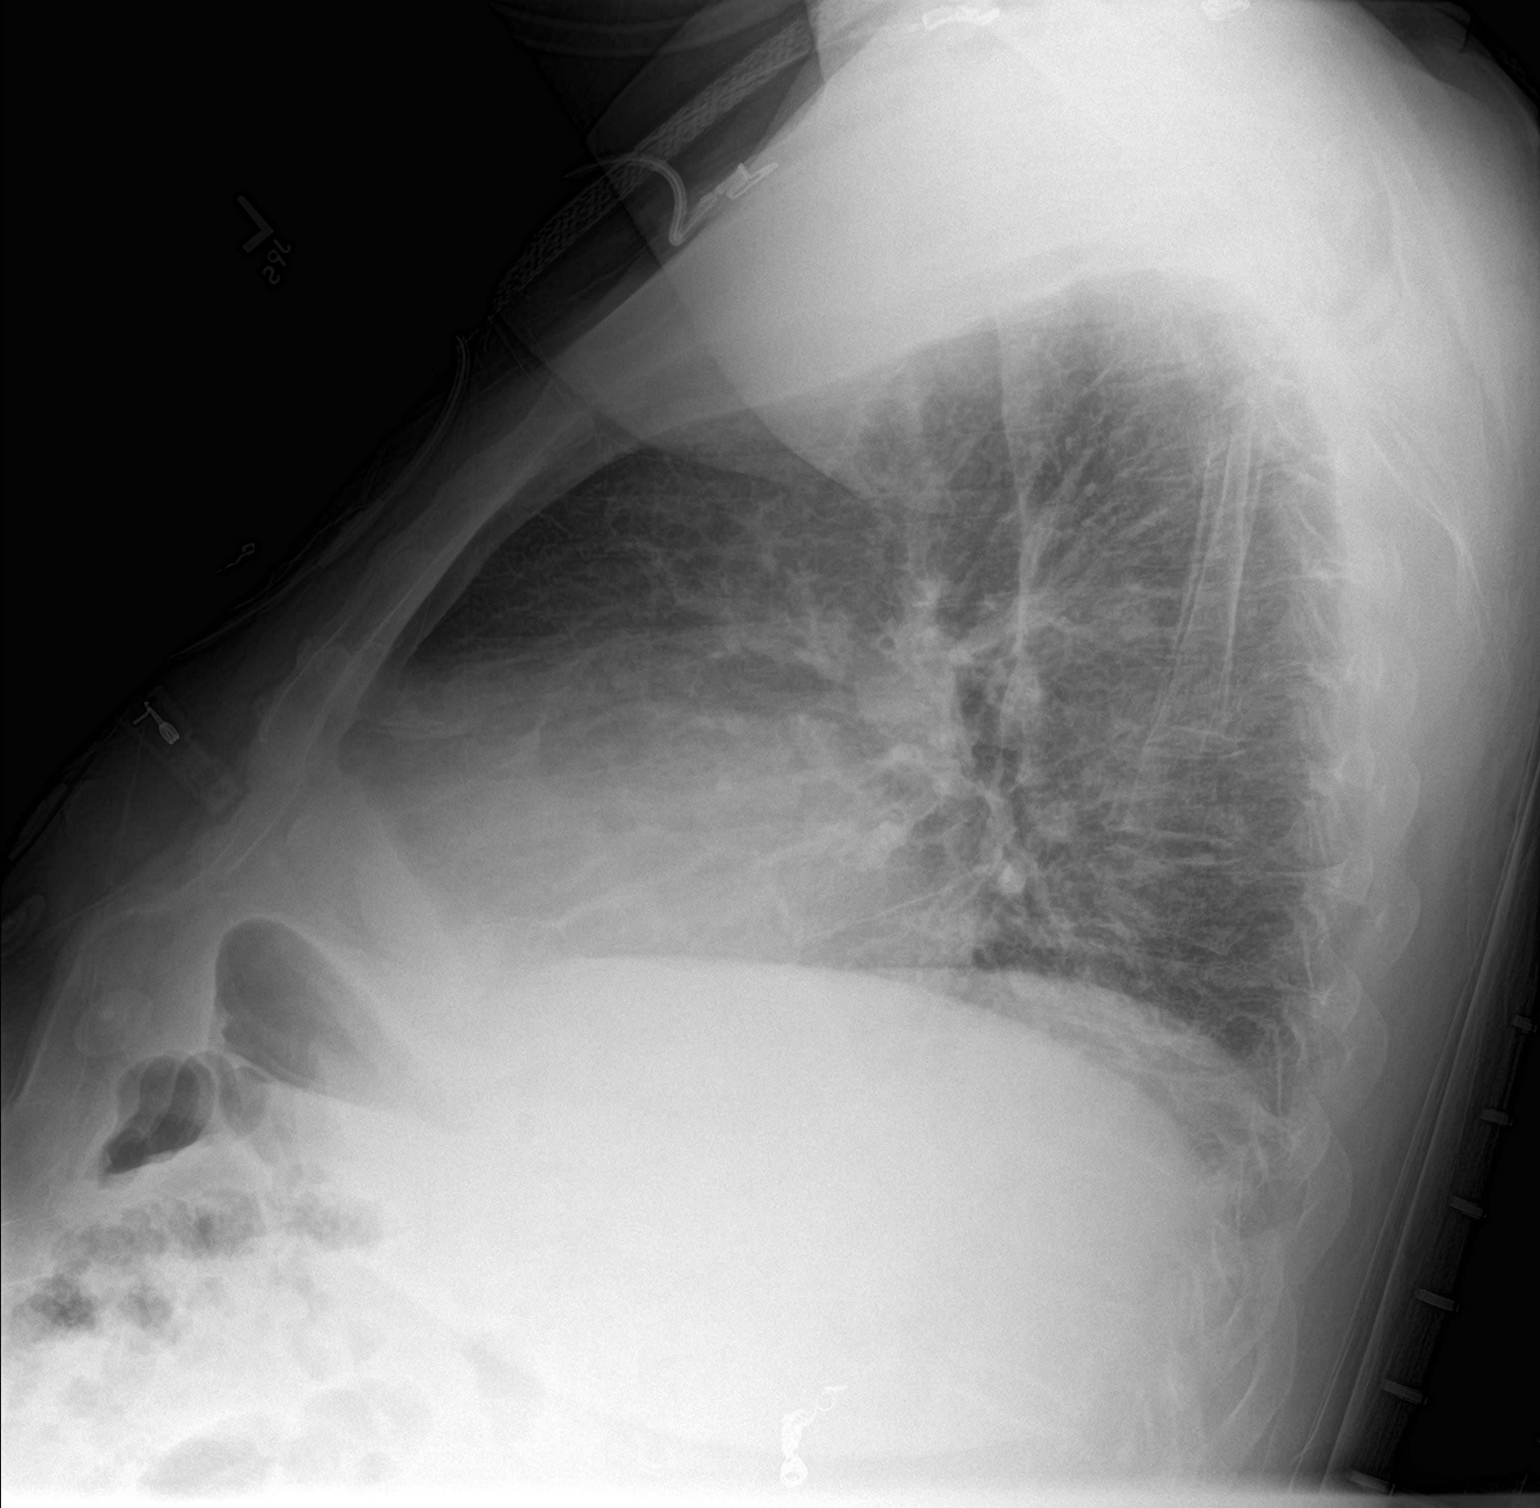

[chest ap]
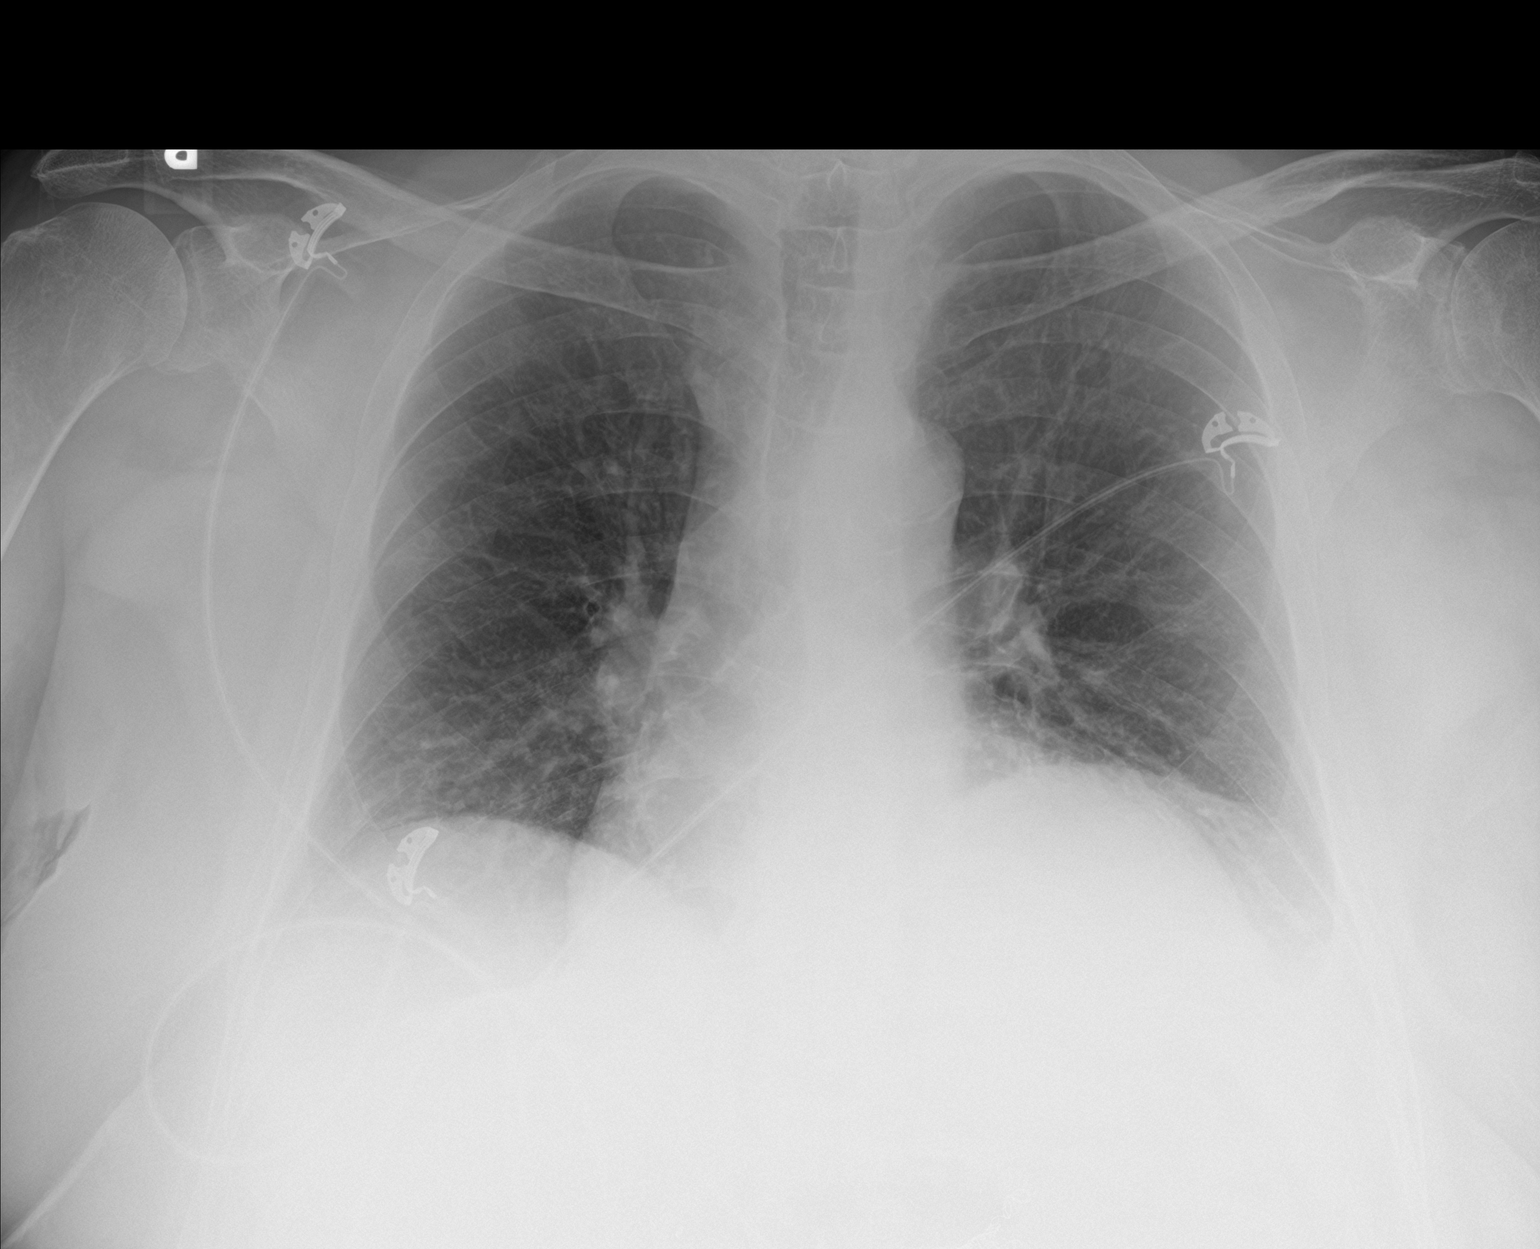

[2 of 2 positions shown; findings below may reference images not displayed]

FINDINGS: Low volume chest with interstitial crowding. There is no edema,
consolidation, effusion, or pneumothorax. Chronic cardiomegaly.
IMPRESSION: 1. No evidence of acute disease.
2. Cardiomegaly.

## 2019-04-07 IMAGING — CT CT HEAD WITHOUT CONTRAST
4 series · 15 of 47 positions shown, 17 images · non-contrast
Comparison: February 01, 2018

CLINICAL DATA: Episodic vertigo.  Renal failure

EXAM:
CT HEAD WITHOUT CONTRAST
TECHNIQUE: Contiguous axial images were obtained from the base of the skull
through the vertex without intravenous contrast.

[Series 3: head wo · axial · 0.45mm/px · z∈[-113,+2]mm · 7 of 31 slices shown, 9 images]
[im 4/31  brain]
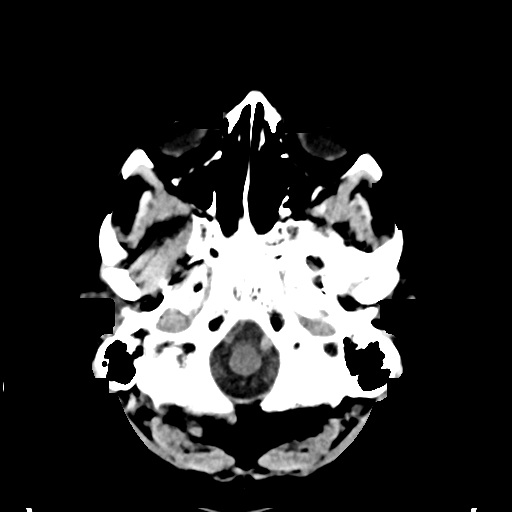
[im 4/31  bone]
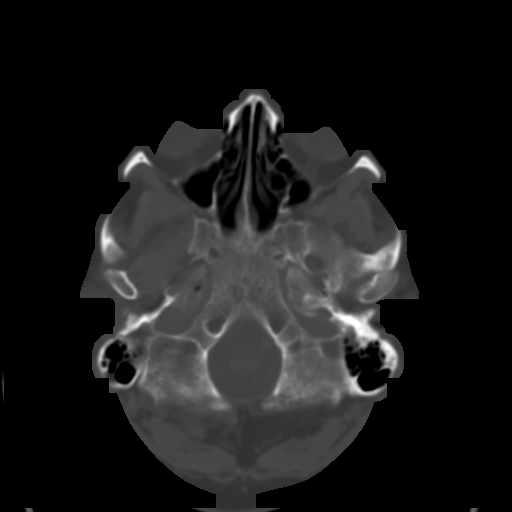
[im 8/31  brain]
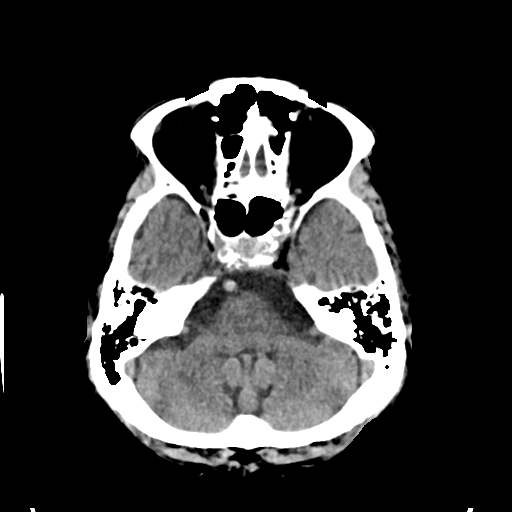
[im 12/31  brain]
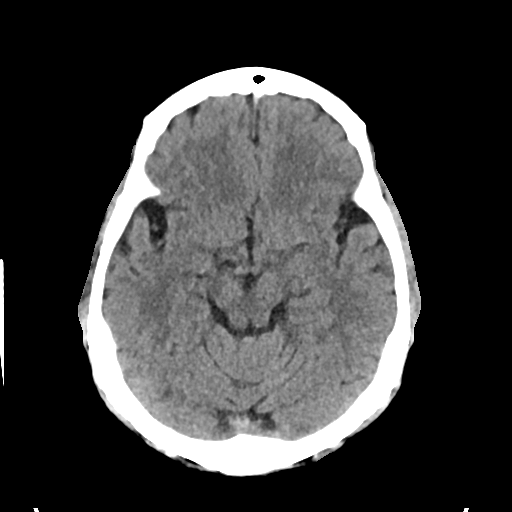
[im 16/31  brain]
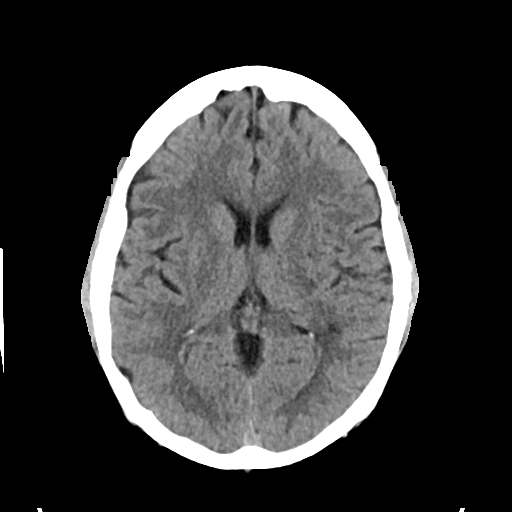
[im 19/31  brain]
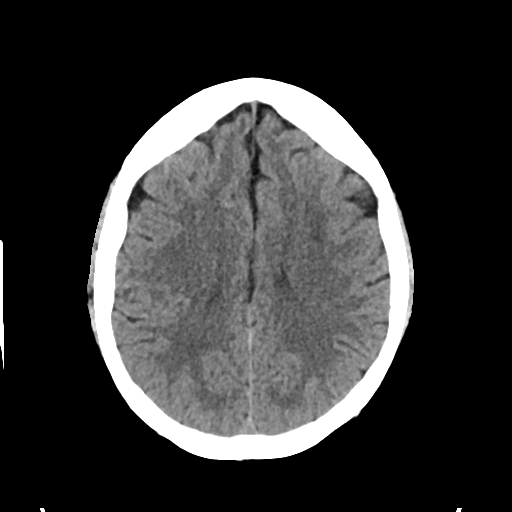
[im 19/31  bone]
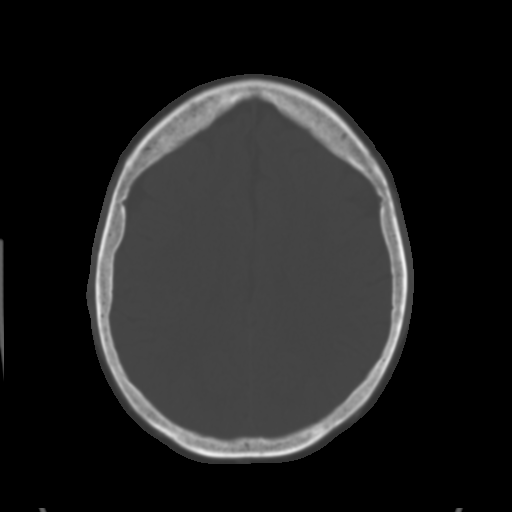
[im 23/31  brain]
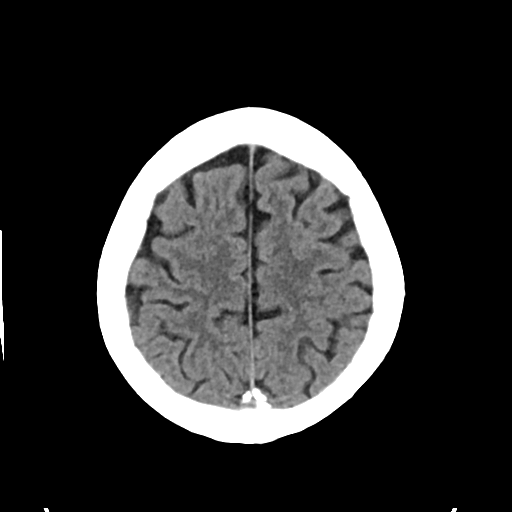
[im 27/31  brain]
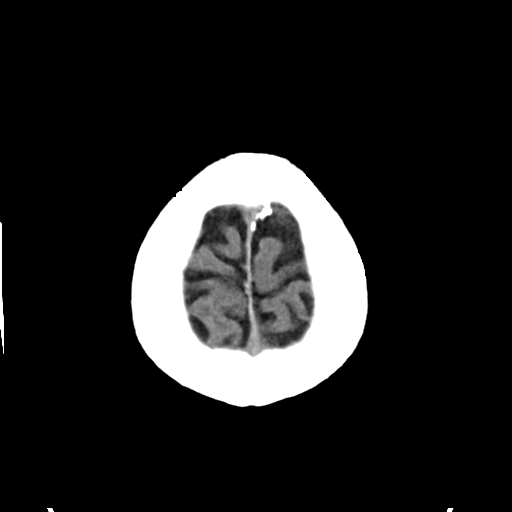

[Series 4: head bone · axial · 0.45mm/px · z∈[-114,-98]mm · 2 of 77 slices shown]
[im 8/77  bone]
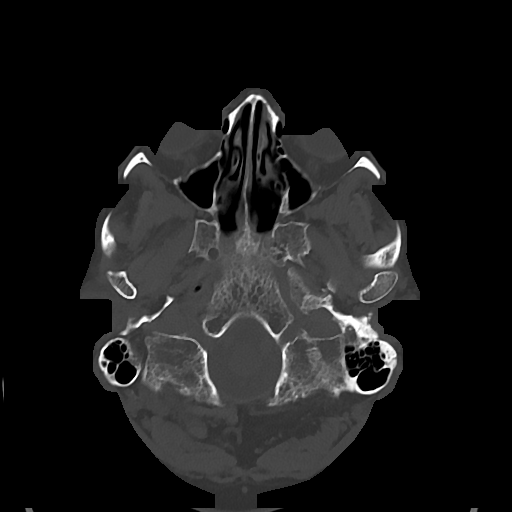
[im 16/77  bone]
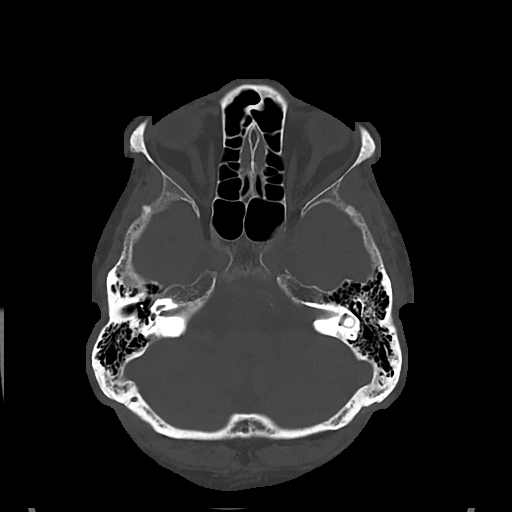

[Series 5: cor soft · coronal · 0.30mm/px · 3 of 67 slices shown]
[im 23/67  brain]
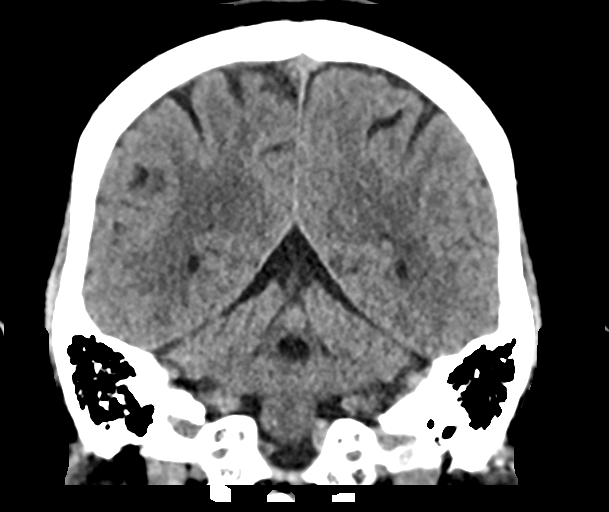
[im 30/67  brain]
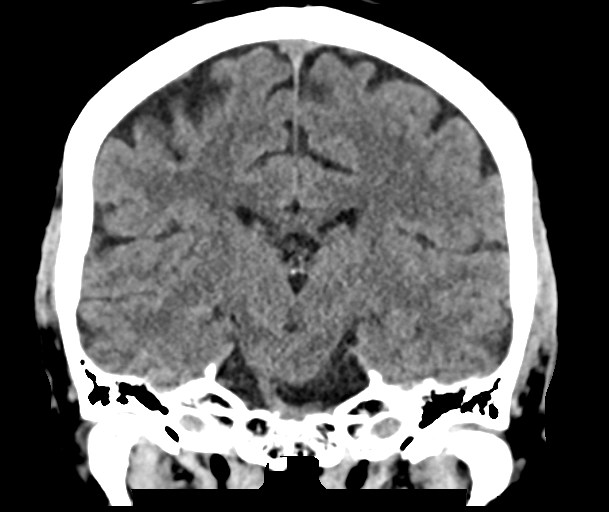
[im 37/67  brain]
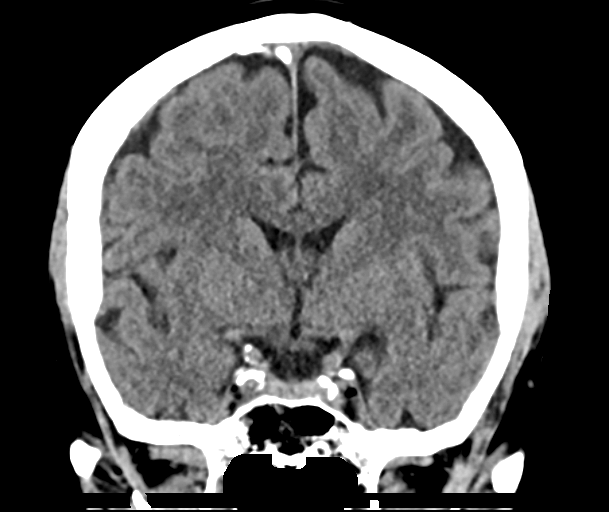

[Series 6: sag soft · sagittal · 0.30mm/px · 3 of 67 slices shown]
[im 23/67  brain]
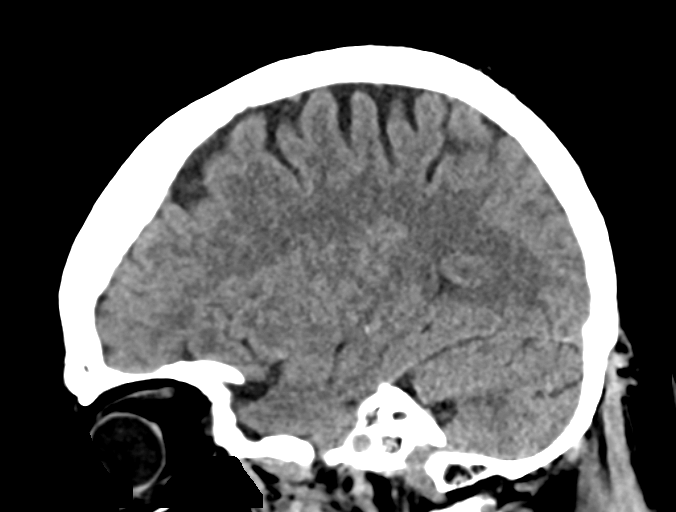
[im 34/67  brain]
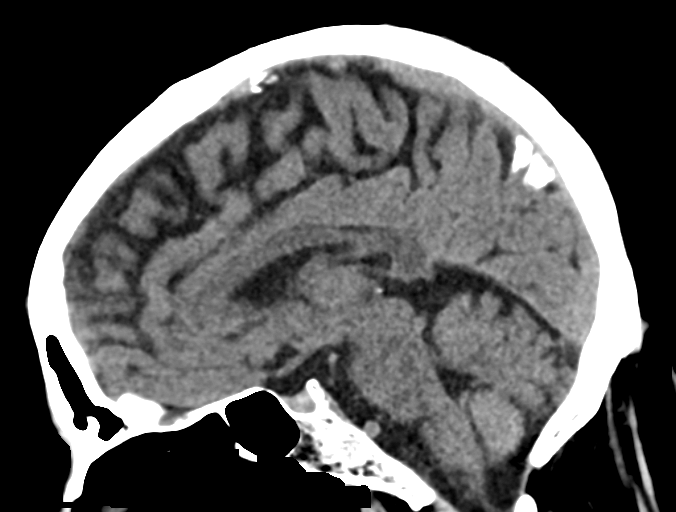
[im 45/67  brain]
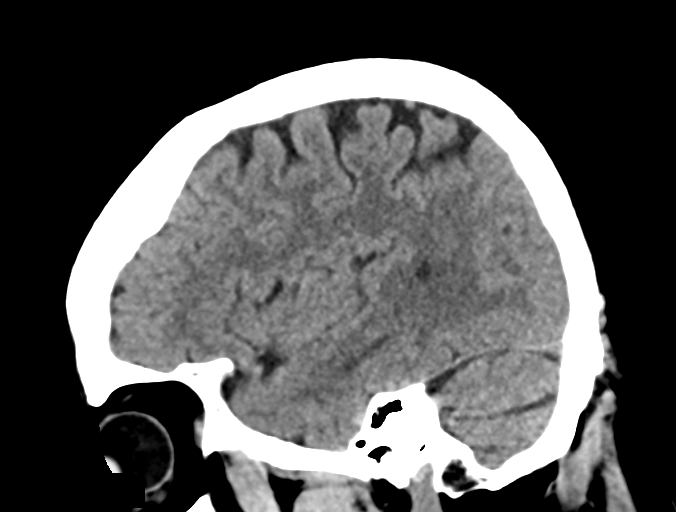

[15 of 47 positions shown; findings below may reference images not displayed]

FINDINGS: Brain: The ventricles and sulci are within normal limits for age.
There is no intracranial mass, hemorrhage, extra-axial fluid
collection, or midline shift. There is mild small vessel disease in
the centra semiovale bilaterally. There are occasional nonacute
appearing lacunar type infarcts within each centrum semiovale. There
is evidence of a prior infarct in the medial right upper pons,
stable. No acute infarct is demonstrable on this study.

Vascular: No hyperdense vessel. There is calcification in each
carotid siphon and distal left vertebral artery. The basilar artery
is somewhat dolichoectatic, stable.

Skull: Bony calvarium appears intact.

Sinuses/Orbits: There is mucosal thickening in several ethmoid air
cells. Other visualized paranasal sinuses are clear. Orbits appear
symmetric bilaterally.

Other: There is mucosal thickening in several medial mastoid air
cells bilaterally. The middle and inner ear regions appear symmetric
bilaterally.
IMPRESSION: Scattered supratentorial and infratentorial lacunar type infarcts.
No acute infarct evident. There is mild periventricular small vessel
disease. No mass or hemorrhage.

Multiple foci of arterial vascular calcification noted. There is
mucosal thickening in several ethmoid air cells. There is mucosal
thickening in several medial mastoid air cells bilaterally.

## 2019-04-09 ENCOUNTER — Telehealth: Payer: Self-pay

## 2019-04-09 DIAGNOSIS — N186 End stage renal disease: Secondary | ICD-10-CM | POA: Diagnosis not present

## 2019-04-09 DIAGNOSIS — D509 Iron deficiency anemia, unspecified: Secondary | ICD-10-CM | POA: Diagnosis not present

## 2019-04-09 DIAGNOSIS — Z992 Dependence on renal dialysis: Secondary | ICD-10-CM | POA: Diagnosis not present

## 2019-04-09 DIAGNOSIS — N2581 Secondary hyperparathyroidism of renal origin: Secondary | ICD-10-CM | POA: Diagnosis not present

## 2019-04-09 DIAGNOSIS — D631 Anemia in chronic kidney disease: Secondary | ICD-10-CM | POA: Diagnosis not present

## 2019-04-09 NOTE — Telephone Encounter (Signed)
Received PT INR results from [facility], called pt to see if his heart doctor is handling his PT INR no answer, VM not set up  Will fax results over to heart doctor

## 2019-04-10 ENCOUNTER — Telehealth: Payer: Self-pay

## 2019-04-10 NOTE — Telephone Encounter (Signed)
Spoke w/pt his cardiologist is the one who handles his PT INR

## 2019-04-11 DIAGNOSIS — N186 End stage renal disease: Secondary | ICD-10-CM | POA: Diagnosis not present

## 2019-04-11 DIAGNOSIS — N2581 Secondary hyperparathyroidism of renal origin: Secondary | ICD-10-CM | POA: Diagnosis not present

## 2019-04-11 DIAGNOSIS — D689 Coagulation defect, unspecified: Secondary | ICD-10-CM | POA: Diagnosis not present

## 2019-04-11 DIAGNOSIS — Z992 Dependence on renal dialysis: Secondary | ICD-10-CM | POA: Diagnosis not present

## 2019-04-11 DIAGNOSIS — D631 Anemia in chronic kidney disease: Secondary | ICD-10-CM | POA: Diagnosis not present

## 2019-04-11 DIAGNOSIS — D509 Iron deficiency anemia, unspecified: Secondary | ICD-10-CM | POA: Diagnosis not present

## 2019-04-13 DIAGNOSIS — N186 End stage renal disease: Secondary | ICD-10-CM | POA: Diagnosis not present

## 2019-04-13 DIAGNOSIS — D631 Anemia in chronic kidney disease: Secondary | ICD-10-CM | POA: Diagnosis not present

## 2019-04-13 DIAGNOSIS — Z992 Dependence on renal dialysis: Secondary | ICD-10-CM | POA: Diagnosis not present

## 2019-04-13 DIAGNOSIS — N2581 Secondary hyperparathyroidism of renal origin: Secondary | ICD-10-CM | POA: Diagnosis not present

## 2019-04-13 DIAGNOSIS — D509 Iron deficiency anemia, unspecified: Secondary | ICD-10-CM | POA: Diagnosis not present

## 2019-04-15 DIAGNOSIS — N186 End stage renal disease: Secondary | ICD-10-CM | POA: Diagnosis not present

## 2019-04-15 DIAGNOSIS — Z992 Dependence on renal dialysis: Secondary | ICD-10-CM | POA: Diagnosis not present

## 2019-04-15 DIAGNOSIS — N2581 Secondary hyperparathyroidism of renal origin: Secondary | ICD-10-CM | POA: Diagnosis not present

## 2019-04-15 DIAGNOSIS — D509 Iron deficiency anemia, unspecified: Secondary | ICD-10-CM | POA: Diagnosis not present

## 2019-04-15 DIAGNOSIS — D631 Anemia in chronic kidney disease: Secondary | ICD-10-CM | POA: Diagnosis not present

## 2019-04-17 DIAGNOSIS — D689 Coagulation defect, unspecified: Secondary | ICD-10-CM | POA: Diagnosis not present

## 2019-04-17 DIAGNOSIS — D509 Iron deficiency anemia, unspecified: Secondary | ICD-10-CM | POA: Diagnosis not present

## 2019-04-17 DIAGNOSIS — D631 Anemia in chronic kidney disease: Secondary | ICD-10-CM | POA: Diagnosis not present

## 2019-04-17 DIAGNOSIS — N186 End stage renal disease: Secondary | ICD-10-CM | POA: Diagnosis not present

## 2019-04-17 DIAGNOSIS — Z992 Dependence on renal dialysis: Secondary | ICD-10-CM | POA: Diagnosis not present

## 2019-04-17 DIAGNOSIS — N2581 Secondary hyperparathyroidism of renal origin: Secondary | ICD-10-CM | POA: Diagnosis not present

## 2019-04-18 ENCOUNTER — Encounter: Payer: Self-pay | Admitting: Internal Medicine

## 2019-04-20 DIAGNOSIS — Z992 Dependence on renal dialysis: Secondary | ICD-10-CM | POA: Diagnosis not present

## 2019-04-20 DIAGNOSIS — N2581 Secondary hyperparathyroidism of renal origin: Secondary | ICD-10-CM | POA: Diagnosis not present

## 2019-04-20 DIAGNOSIS — D509 Iron deficiency anemia, unspecified: Secondary | ICD-10-CM | POA: Diagnosis not present

## 2019-04-20 DIAGNOSIS — D631 Anemia in chronic kidney disease: Secondary | ICD-10-CM | POA: Diagnosis not present

## 2019-04-20 DIAGNOSIS — N186 End stage renal disease: Secondary | ICD-10-CM | POA: Diagnosis not present

## 2019-04-23 DIAGNOSIS — D631 Anemia in chronic kidney disease: Secondary | ICD-10-CM | POA: Diagnosis not present

## 2019-04-23 DIAGNOSIS — N2581 Secondary hyperparathyroidism of renal origin: Secondary | ICD-10-CM | POA: Diagnosis not present

## 2019-04-23 DIAGNOSIS — N186 End stage renal disease: Secondary | ICD-10-CM | POA: Diagnosis not present

## 2019-04-23 DIAGNOSIS — D509 Iron deficiency anemia, unspecified: Secondary | ICD-10-CM | POA: Diagnosis not present

## 2019-04-23 DIAGNOSIS — Z992 Dependence on renal dialysis: Secondary | ICD-10-CM | POA: Diagnosis not present

## 2019-04-25 DIAGNOSIS — I4892 Unspecified atrial flutter: Secondary | ICD-10-CM | POA: Diagnosis not present

## 2019-05-01 ENCOUNTER — Other Ambulatory Visit: Payer: Self-pay

## 2019-05-01 ENCOUNTER — Ambulatory Visit (INDEPENDENT_AMBULATORY_CARE_PROVIDER_SITE_OTHER): Payer: Self-pay | Admitting: Vascular Surgery

## 2019-05-01 ENCOUNTER — Encounter: Payer: Self-pay | Admitting: Vascular Surgery

## 2019-05-01 VITALS — BP 89/63 | HR 96 | Temp 97.2°F | Resp 16 | Ht 73.0 in | Wt 251.0 lb

## 2019-05-01 DIAGNOSIS — Z992 Dependence on renal dialysis: Secondary | ICD-10-CM

## 2019-05-01 DIAGNOSIS — N186 End stage renal disease: Secondary | ICD-10-CM

## 2019-05-01 NOTE — Progress Notes (Signed)
Patient name: [name] MRN: [id number] DOB: [birth year] Sex: male  REASON FOR VISIT: Postop wound check  HPI: [name] is a 58 y.o. male with multiple medical problems including end-stage renal disease that presents for postop check after revision of his left radiocephalic AV fistula with ulcer resection and plication on 95/18/8416.  This was actually the resection and plication of a second ulcer on his left forearm.  The surgery was done on 02/01/2019.  Ultimately this was done in stages because patient wanted to avoid a catheter.  He has recovered well from surgery.  The incisions have all healed.  States that the fistula is working fine and dialysis.  No immediate concerns.  Past Medical History:  Diagnosis Date  . Acute respiratory failure with hypoxia ([place]) 01/13/2015  . Atrial flutter ([place])   . Bile leak, postoperative 03/15/2016  . Biliary dyskinesia 02/12/2016  . 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 Cardiomyopathy   . CHF (congestive heart failure) ([place])   . Coronary artery disease    normal coronaries by 10/10/08 cath, Cardiac Cath 08-04-12 epic.Dr. Doylene Canard follows  . Diabetes mellitus    pt. states he's borderline diabetic., no longer taking med,- off med. since 2013  . Dialysis patient [facility])    Mon-Wed-Fri([place])- Left AV fistula  . Diverticulitis November 2016   reoccurred in December 2016  . Diverticulitis of intestine without perforation or abscess without bleeding 04/21/2015  . DVT (deep venous thrombosis) ([place])   . ESRD (end stage renal disease) ([place])   . GERD (gastroesophageal reflux disease)   . Gout   . Hemorrhage of left kidney 01/13/2018  . History of nephrectomy 07/04/2012   History of right nephrectomy in 2002 for renal cell carcinoma   . History of unilateral nephrectomy   . Hypertension   . Low iron   . Myocardial infarction [facility]) ?2006  . Renal cell carcinoma    dialysis- MWF- [address]- Dr. Mercy Moore  follows.  . Renal insufficiency   . Shortness of breath 05/19/11   "at rest, lying down, w/exertion"  . Stroke [facility]) 02/2011   05/19/11 denies residual  . Umbilical hernia 60/63/01   unrepaired  . Wears glasses     Past Surgical History:  Procedure Laterality Date  . ANAL FISTULOTOMY  08/15/2018  . AV FISTULA PLACEMENT  03/29/2011   Procedure: ARTERIOVENOUS (AV) FISTULA CREATION;  Surgeon: Hinda Lenis, MD;  Location: [place];  Service: Vascular;  Laterality: Left;  LEFT RADIOCEPHALIC , Arteriovenous [id number]([id number])  . CARDIAC CATHETERIZATION  05/21/11  . CARDIAC CATHETERIZATION N/A 03/16/2016   Procedure: Left Heart Cath and Coronary Angiography;  Surgeon: Dixie Dials, MD;  Location: [place] CV LAB;  Service: Cardiovascular;  Laterality: N/A;  . CHOLECYSTECTOMY N/A 02/12/2016   Procedure: LAPAROSCOPIC CHOLECYSTECTOMY WITH INTRAOPERATIVE CHOLANGIOGRAM;  Surgeon: Jackolyn Confer, MD;  Location: [place];  Service: General;  Laterality: N/A;  . COLONOSCOPY WITH PROPOFOL N/A 02/01/2017   Procedure: COLONOSCOPY WITH PROPOFOL;  Surgeon: Juanita Craver, MD;  Location: [facility];  Service: Endoscopy;  Laterality: N/A;  . dialysis cath placed    . ESOPHAGOGASTRODUODENOSCOPY (EGD) WITH PROPOFOL N/A 12/02/2015   Procedure: ESOPHAGOGASTRODUODENOSCOPY (EGD) WITH PROPOFOL;  Surgeon: Juanita Craver, MD;  Location: [facility];  Service: Endoscopy;  Laterality: N/A;  . EVALUATION UNDER ANESTHESIA WITH HEMORRHOIDECTOMY N/A 08/15/2018   Procedure: HEMORRHOID LIGATION/PEXY;  Surgeon: Michael Boston, MD;  Location: [place];  Service: General;  Laterality: N/A;  . FINGER SURGERY     L pinkie finger- ORIF- /w remaining hardware - 1990's    . FISTULOTOMY N/A 08/15/2018   Procedure: SUPERFICIAL ANAL FISTULOTOMY;  Surgeon: Michael Boston, MD;  Location: [place];  Service: General;  Laterality: N/A;  . HEMORRHOID SURGERY    . HERNIA REPAIR N/A 07/15/9796   Umbilical hernia repair  . INSERTION OF DIALYSIS CATHETER  N/A 01/27/2016   Procedure: INSERTION OF DIALYSIS CATHETER;  Surgeon: Waynetta Sandy, MD;  Location: [place];  Service: Vascular;  Laterality: N/A;  . INSERTION OF MESH N/A 07/04/2012   Procedure: INSERTION OF MESH;  Surgeon: Madilyn Hook, DO;  Location: [place];  Service: General;  Laterality: N/A;  . IR EMBO ART  VEN HEMORR LYMPH EXTRAV  INC GUIDE ROADMAPPING  01/13/2018  . IR FLUORO GUIDE CV LINE RIGHT  01/13/2018  . IR IVC FILTER PLMT / S&I /IMG GUID/MOD SED  01/27/2018  . IR RENAL SELECTIVE  UNI INC S&I MOD SED  01/13/2018  . IR US GUIDE VASC ACCESS RIGHT  01/13/2018  . KIDNEY CYST REMOVAL  2019  . LAPAROSCOPIC LYSIS OF ADHESIONS N/A 02/12/2016   Procedure: LAPAROSCOPIC LYSIS OF ADHESIONS;  Surgeon: Jackolyn Confer, MD;  Location: [place];  Service: General;  Laterality: N/A;  . LEFT AND RIGHT HEART CATHETERIZATION WITH CORONARY ANGIOGRAM N/A 08/04/2012   Procedure: LEFT AND RIGHT HEART CATHETERIZATION WITH CORONARY ANGIOGRAM;  Surgeon: Birdie Riddle, MD;  Location: [place] CATH LAB;  Service: Cardiovascular;  Laterality: N/A;  . NEPHRECTOMY  2000   right  . REVISON OF ARTERIOVENOUS FISTULA Left 06/05/2013   Procedure: REVISON OF LEFT RADIOCEPHALIC  ARTERIOVENOUS FISTULA;  Surgeon: Conrad [place], MD;  Location: [place];  Service: Vascular;  Laterality: Left;  . REVISON OF ARTERIOVENOUS FISTULA Left 01/27/2016   Procedure: REVISION OF LEFT UPPER EXTREMITY ARTERIOVENOUS FISTULA;  Surgeon: Waynetta Sandy, MD;  Location: [place];  Service: Vascular;  Laterality: Left;  . REVISON OF ARTERIOVENOUS FISTULA Left 08/03/2016   Procedure: REVISON OF Left arm ARTERIOVENOUS FISTULA;  Surgeon: Elam Dutch, MD;  Location: [place];  Service: Vascular;  Laterality: Left;  . REVISON OF ARTERIOVENOUS FISTULA Left 05/06/2017   Procedure: REVISION PLICATION OF ARTERIOVENOUS FISTULA LEFT ARM;  Surgeon: Rosetta Posner, MD;  Location: [place];  Service: Vascular;  Laterality: Left;  . REVISON OF ARTERIOVENOUS FISTULA  Left 02/01/2019   Procedure: REVISION OF ARTERIOVENOUS FISTULA LEFT ARM;  Surgeon: [name], MD;  Location: [place];  Service: Vascular;  Laterality: Left;  . REVISON OF ARTERIOVENOUS FISTULA Left 03/15/2019   Procedure: REVISION PLICATION OF ARTERIOVENOUS FISTULA LEFT ARM;  Surgeon: [name], MD;  Location: [place];  Service: Vascular;  Laterality: Left;  . RIGHT HEART CATHETERIZATION N/A 05/21/2011   Procedure: RIGHT HEART CATH;  Surgeon: Birdie Riddle, MD;  Location: [facility] CATH LAB;  Service: Cardiovascular;  Laterality: N/A;  . smashed  1990's   "left pinky; have a plate in there"  . UMBILICAL HERNIA REPAIR N/A 07/04/2012   Procedure: LAPAROSCOPIC UMBILICAL HERNIA;  Surgeon: Madilyn Hook, DO;  Location: [place];  Service: General;  Laterality: N/A;  . US ECHOCARDIOGRAPHY  05/20/11    Family History  Problem Relation Age of Onset  . Hypertension Mother   . Kidney disease Mother     SOCIAL HISTORY: Social History   Tobacco Use  . Smoking status: Never Smoker  . Smokeless tobacco: Never Used  Substance Use  Topics  . Alcohol use: No    Alcohol/week: 0.0 standard drinks    Allergies  Allergen Reactions  . Ace Inhibitors Itching, Cough and Other (See Comments)    Current Outpatient Medications  Medication Sig Dispense Refill  . allopurinol (ZYLOPRIM) 100 MG tablet Take 1 tablet (100 mg total) by mouth daily. 30 tablet 3  . calcium acetate (PHOSLO) 667 MG capsule Take 1-3 capsules (667-2,001 mg total) by mouth See admin instructions. Take 2001 mg capsules with meals three times daily and 667 mg capsule with snacks.    . chlorhexidine (PERIDEX) 0.12 % solution 15 mLs by Mouth Rinse route daily as needed (dryness).     . ferric citrate (AURYXIA) 1 GM 210 MG(Fe) tablet Take 210 mg by mouth 3 (three) times daily with meals.     . Lidocaine, Anorectal, (L-M-X 5) 5 % CREA Apply 1 application topically 4 (four) times daily as needed (apply to area up to 6 times daily as  needed). (Patient taking differently: Apply 1 application topically See admin instructions. Apply up to six times daily as needed for discomfort) 1 Tube 0  . lidocaine-prilocaine (EMLA) cream Apply 1 application topically See admin instructions. Apply topically to port access prior to dialysis - Monday, Wednesday, Friday  12  . loperamide (IMODIUM) 2 MG capsule Take 1 capsule (2 mg total) by mouth as needed for diarrhea or loose stools. 30 capsule 0  . multivitamin (RENA-VIT) TABS tablet Take 1 tablet by mouth at bedtime.    . nitroGLYCERIN (NITROSTAT) 0.4 MG SL tablet Place 0.4 mg under the tongue every 5 (five) minutes as needed for chest pain.   0  . pantoprazole (PROTONIX) 40 MG tablet Take 40 mg by mouth 2 (two) times daily.    Marland Kitchen UNABLE TO FIND Out patient physical therapy- vestibular therapy for BPPV 1 each 0  . warfarin (COUMADIN) 2.5 MG tablet Take 3 tablets (7.5 mg total) by mouth daily at 6 PM.    . oxyCODONE-acetaminophen (PERCOCET) 5-325 MG tablet Take 1 tablet by mouth every 6 (six) hours as needed for severe pain. (Patient not taking: Reported on 05/01/2019) 8 tablet 0   No current facility-administered medications for this visit.     REVIEW OF SYSTEMS:  [X]  denotes positive finding, [ ]  denotes negative finding Cardiac  Comments:  Chest pain or chest pressure:    Shortness of breath upon exertion:    Short of breath when lying flat:    Irregular heart rhythm:        Vascular    Pain in calf, thigh, or hip brought on by ambulation:    Pain in feet at night that wakes you up from your sleep:     Blood clot in your veins:    Leg swelling:         Pulmonary    Oxygen at home:    Productive cough:     Wheezing:         Neurologic    Sudden weakness in arms or legs:     Sudden numbness in arms or legs:     Sudden onset of difficulty speaking or slurred speech:    Temporary loss of vision in one eye:     Problems with dizziness:         Gastrointestinal    Blood in  stool:     Vomited blood:         Genitourinary    Burning when urinating:  Blood in urine:        Psychiatric    Major depression:         Hematologic    Bleeding problems:    Problems with blood clotting too easily:        Skin    Rashes or ulcers:        Constitutional    Fever or chills:      PHYSICAL EXAM: Vitals:   05/01/19 0859  BP: (!) 89/63  Pulse: 96  Resp: 16  Temp: (!) 97.2 F (36.2 C)  TempSrc: Temporal  SpO2: 99%  Weight: 251 lb (113.9 kg)  Height: 6\' 1"  (1.854 m)    GENERAL: The patient is a well-nourished male, in no acute distress. The vital signs are documented above. CARDIAC: There is a regular rate and rhythm.  VASCULAR:  Left radiocephalic AV fistula good thrill. Left forearm incisions well-healed.  DATA:   None  Assessment/Plan:  58 year old male status post left radiocephalic AV fistula revision with resection of ulcer and plication on 00/92/3300.  All of his incisions on the left forearm are now healed.  Fistula has a good thrill.  I discussed with him that I think the dialysis nurses can access the fistula anywhere along the forearm that is easily accessible.  He can follow-up with me as needed.   [name], MD [facility] Office: [phone] Pager: [phone]

## 2019-05-02 DIAGNOSIS — D689 Coagulation defect, unspecified: Secondary | ICD-10-CM | POA: Diagnosis not present

## 2019-05-07 ENCOUNTER — Encounter: Payer: Self-pay | Admitting: Internal Medicine

## 2019-05-09 LAB — POCT INR: INR: 2.4 — AB (ref 0.9–1.1)

## 2019-05-09 LAB — PROTIME-INR: Protime: 26.1 — AB (ref 10.0–13.8)

## 2019-05-10 ENCOUNTER — Telehealth: Payer: Self-pay

## 2019-05-10 NOTE — Telephone Encounter (Signed)
Patient's wife sent over a mychart message stating that the patient has been having dizziness and I called and spoke with the patient he stated after dialysis his blood pressure is low 90s/80s and he has mentioned it to them and they stated to give [place] a call I offered pt an appointment for this upcoming Monday he declined due to having to go to dialysis so I advised him to go to urgent care so he can be evaluated. YRL,RMA

## 2019-05-15 ENCOUNTER — Encounter: Payer: Self-pay | Admitting: Internal Medicine

## 2019-05-22 ENCOUNTER — Encounter: Payer: Self-pay | Admitting: Internal Medicine

## 2019-05-23 ENCOUNTER — Encounter: Payer: Self-pay | Admitting: Internal Medicine

## 2019-05-25 DIAGNOSIS — N186 End stage renal disease: Secondary | ICD-10-CM | POA: Diagnosis not present

## 2019-05-25 DIAGNOSIS — I120 Hypertensive chronic kidney disease with stage 5 chronic kidney disease or end stage renal disease: Secondary | ICD-10-CM | POA: Diagnosis not present

## 2019-05-25 DIAGNOSIS — Z992 Dependence on renal dialysis: Secondary | ICD-10-CM | POA: Diagnosis not present

## 2019-05-26 DIAGNOSIS — N2581 Secondary hyperparathyroidism of renal origin: Secondary | ICD-10-CM | POA: Diagnosis not present

## 2019-05-26 DIAGNOSIS — Z992 Dependence on renal dialysis: Secondary | ICD-10-CM | POA: Diagnosis not present

## 2019-05-26 DIAGNOSIS — D509 Iron deficiency anemia, unspecified: Secondary | ICD-10-CM | POA: Diagnosis not present

## 2019-05-26 DIAGNOSIS — D631 Anemia in chronic kidney disease: Secondary | ICD-10-CM | POA: Diagnosis not present

## 2019-05-26 DIAGNOSIS — N186 End stage renal disease: Secondary | ICD-10-CM | POA: Diagnosis not present

## 2019-05-28 DIAGNOSIS — D509 Iron deficiency anemia, unspecified: Secondary | ICD-10-CM | POA: Diagnosis not present

## 2019-05-28 DIAGNOSIS — N186 End stage renal disease: Secondary | ICD-10-CM | POA: Diagnosis not present

## 2019-05-28 DIAGNOSIS — D631 Anemia in chronic kidney disease: Secondary | ICD-10-CM | POA: Diagnosis not present

## 2019-05-28 DIAGNOSIS — N2581 Secondary hyperparathyroidism of renal origin: Secondary | ICD-10-CM | POA: Diagnosis not present

## 2019-05-28 DIAGNOSIS — Z992 Dependence on renal dialysis: Secondary | ICD-10-CM | POA: Diagnosis not present

## 2019-05-30 DIAGNOSIS — D509 Iron deficiency anemia, unspecified: Secondary | ICD-10-CM | POA: Diagnosis not present

## 2019-05-30 DIAGNOSIS — E1129 Type 2 diabetes mellitus with other diabetic kidney complication: Secondary | ICD-10-CM | POA: Diagnosis not present

## 2019-05-30 DIAGNOSIS — I4892 Unspecified atrial flutter: Secondary | ICD-10-CM | POA: Diagnosis not present

## 2019-05-30 DIAGNOSIS — N186 End stage renal disease: Secondary | ICD-10-CM | POA: Diagnosis not present

## 2019-05-30 DIAGNOSIS — Z992 Dependence on renal dialysis: Secondary | ICD-10-CM | POA: Diagnosis not present

## 2019-05-30 DIAGNOSIS — N2581 Secondary hyperparathyroidism of renal origin: Secondary | ICD-10-CM | POA: Diagnosis not present

## 2019-05-30 DIAGNOSIS — D631 Anemia in chronic kidney disease: Secondary | ICD-10-CM | POA: Diagnosis not present

## 2019-05-31 ENCOUNTER — Encounter: Payer: Self-pay | Admitting: Nurse Practitioner

## 2019-05-31 ENCOUNTER — Telehealth (INDEPENDENT_AMBULATORY_CARE_PROVIDER_SITE_OTHER): Payer: Medicare Other | Admitting: Nurse Practitioner

## 2019-05-31 ENCOUNTER — Other Ambulatory Visit: Payer: Self-pay

## 2019-05-31 ENCOUNTER — Encounter: Payer: Self-pay | Admitting: Cardiology

## 2019-05-31 ENCOUNTER — Ambulatory Visit (INDEPENDENT_AMBULATORY_CARE_PROVIDER_SITE_OTHER): Payer: Medicare Other | Admitting: Cardiology

## 2019-05-31 VITALS — BP 96/63 | Wt 252.0 lb

## 2019-05-31 VITALS — BP 98/62 | HR 111 | Temp 98.1°F | Resp 16 | Ht 73.0 in | Wt 252.6 lb

## 2019-05-31 DIAGNOSIS — I953 Hypotension of hemodialysis: Secondary | ICD-10-CM | POA: Diagnosis not present

## 2019-05-31 DIAGNOSIS — I428 Other cardiomyopathies: Secondary | ICD-10-CM

## 2019-05-31 DIAGNOSIS — I959 Hypotension, unspecified: Secondary | ICD-10-CM | POA: Diagnosis not present

## 2019-05-31 DIAGNOSIS — I480 Paroxysmal atrial fibrillation: Secondary | ICD-10-CM | POA: Diagnosis not present

## 2019-05-31 DIAGNOSIS — R5383 Other fatigue: Secondary | ICD-10-CM

## 2019-05-31 DIAGNOSIS — I5042 Chronic combined systolic (congestive) and diastolic (congestive) heart failure: Secondary | ICD-10-CM

## 2019-05-31 NOTE — Progress Notes (Signed)
Virtual Visit via Telephone   This visit type was conducted due to national recommendations for restrictions regarding the COVID-19 Pandemic (e.g. social distancing) in an effort to limit this patient's exposure and mitigate transmission in our community.  Due to his co-morbid illnesses, this patient is at least at moderate risk for complications without adequate follow up.  This format is felt to be most appropriate for this patient at this time.  All issues noted in this document were discussed and addressed.  A limited physical exam was performed with this format.    This visit type was conducted due to national recommendations for restrictions regarding the COVID-19 Pandemic (e.g. social distancing) in an effort to limit this patient's exposure and mitigate transmission in our community.  Patients identity confirmed using two different identifiers.  This format is felt to be most appropriate for this patient at this time.  All issues noted in this document were discussed and addressed.  No physical exam was performed (except for noted visual exam findings with Video Visits).    Date:  05/31/2019   ID:  [name], DOB Apr 11, 1961, MRN [id number]  Patient Location:  [employer] - spoke to [employer] location:   Equities trader Complaint:  Patient states had been more tired  History of Present Illness:    [name] is a 59 y.o. male who presents via video conferencing for a telehealth visit today.    The patient does not have symptoms concerning for COVID-19 infection (fever, chills, cough, or new shortness of breath).   He reports his blood pressure is low - he was seen by Cardiology today.  He is taking a medication to increase his blood pressure. He was [place] [id number].  He is going to Dr. Loletha Grayer - [facility] off [address]  Gastroesophageal Reflux He complains of coughing (no longer having cough per patient). He reports no abdominal pain. This is a chronic problem. The  problem occurs occasionally. Risk factors include obesity.     Past Medical History:  Diagnosis Date  . Acute lower GI bleeding 06/21/2018  . Acute respiratory failure with hypoxia ([place]) 01/13/2015  . Atrial flutter ([place])   . Bile leak, postoperative 03/15/2016  . Biliary dyskinesia 02/12/2016  . 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 Cardiomyopathy   . CHF (congestive heart failure) ([place])   . Coronary artery disease    normal coronaries by 10/10/08 cath, Cardiac Cath 08-04-12 epic.Dr. Doylene Canard follows  . Diabetes mellitus    pt. states he's borderline diabetic., no longer taking med,- off med. since 2013  . Dialysis patient [facility])    Mon-Wed-Fri([place])- Left AV fistula  . Diverticulitis November 2016   reoccurred in December 2016  . Diverticulitis of intestine without perforation or abscess without bleeding 04/21/2015  . DVT (deep venous thrombosis) ([place])   . ESRD (end stage renal disease) ([place])   . GERD (gastroesophageal reflux disease)   . Gout   . Hemorrhage of left kidney 01/13/2018  . History of nephrectomy 07/04/2012   History of right nephrectomy in 2002 for renal cell carcinoma   . History of unilateral nephrectomy   . Hypertension   . Low iron   . Myocardial infarction [facility]) ?2006  . Renal cell carcinoma    dialysis- MWF- [address]- Dr. Mercy  follows.  . Renal insufficiency   . Shortness of breath 05/19/11   "at rest, lying  down, w/exertion"  . Stroke [facility]) 02/2011   05/19/11 denies residual  . Umbilical hernia 86/76/19   unrepaired  . Wears glasses    Past Surgical History:  Procedure Laterality Date  . ANAL FISTULOTOMY  08/15/2018  . AV FISTULA PLACEMENT  03/29/2011   Procedure: ARTERIOVENOUS (AV) FISTULA CREATION;  Surgeon: Hinda Lenis, MD;  [place]  Service: Vascular;  Laterality: Left;  LEFT RADIOCEPHALIC , Arteriovenous [id number])  . CARDIAC CATHETERIZATION  05/21/11  . CARDIAC CATHETERIZATION  N/A 03/16/2016   Procedure: Left Heart Cath and Coronary Angiography;  Surgeon: Dixie Dials, MD;  Location: [place] CV LAB;  Service: Cardiovascular;  Laterality: N/A;  . CHOLECYSTECTOMY N/A 02/12/2016   Procedure: LAPAROSCOPIC CHOLECYSTECTOMY WITH INTRAOPERATIVE CHOLANGIOGRAM;  Surgeon: Jackolyn Confer, MD;  Location: [place];  Service: General;  Laterality: N/A;  . COLONOSCOPY WITH PROPOFOL N/A 02/01/2017   Procedure: COLONOSCOPY WITH PROPOFOL;  Surgeon: Juanita Craver, MD;  Location: [facility];  Service: Endoscopy;  Laterality: N/A;  . dialysis cath placed    . ESOPHAGOGASTRODUODENOSCOPY (EGD) WITH PROPOFOL N/A 12/02/2015   Procedure: ESOPHAGOGASTRODUODENOSCOPY (EGD) WITH PROPOFOL;  Surgeon: Juanita Craver, MD;  Location: [facility];  Service: Endoscopy;  Laterality: N/A;  . EVALUATION UNDER ANESTHESIA WITH HEMORRHOIDECTOMY N/A 08/15/2018   Procedure: HEMORRHOID LIGATION/PEXY;  Surgeon: Michael Boston, MD;  Location: [place];  Service: General;  Laterality: N/A;  . FINGER SURGERY     L pinkie finger- ORIF- /w remaining hardware - 1990's    . FISTULOTOMY N/A 08/15/2018   Procedure: SUPERFICIAL ANAL FISTULOTOMY;  Surgeon: Michael Boston, MD;  Location: [place];  Service: General;  Laterality: N/A;  . HEMORRHOID SURGERY    . HERNIA REPAIR N/A [id number]/[id number]   Umbilical hernia repair  . INSERTION OF DIALYSIS CATHETER N/A 01/27/2016   Procedure: INSERTION OF DIALYSIS CATHETER;  Surgeon: Waynetta Sandy, MD;  Location: [place];  Service: Vascular;  Laterality: N/A;  . INSERTION OF MESH N/A 07/04/2012   Procedure: INSERTION OF MESH;  Surgeon: Madilyn Hook, DO;  Location: [place];  Service: General;  Laterality: N/A;  . IR EMBO ART  VEN HEMORR LYMPH EXTRAV  INC GUIDE ROADMAPPING  01/13/2018  . IR FLUORO GUIDE CV LINE RIGHT  01/13/2018  . IR IVC FILTER PLMT / S&I /IMG GUID/MOD SED  01/27/2018  . IR RENAL SELECTIVE  UNI INC S&I MOD SED  01/13/2018  . IR US GUIDE VASC ACCESS RIGHT  01/13/2018  . KIDNEY CYST  REMOVAL  2019  . LAPAROSCOPIC LYSIS OF ADHESIONS N/A 02/12/2016   Procedure: LAPAROSCOPIC LYSIS OF ADHESIONS;  Surgeon: Jackolyn Confer, MD;  Location: [place];  Service: General;  Laterality: N/A;  . LEFT AND RIGHT HEART CATHETERIZATION WITH CORONARY ANGIOGRAM N/A 08/04/2012   Procedure: LEFT AND RIGHT HEART CATHETERIZATION WITH CORONARY ANGIOGRAM;  Surgeon: Birdie Riddle, MD;  Location: [place] CATH LAB;  Service: Cardiovascular;  Laterality: N/A;  . NEPHRECTOMY  2000   right  . REVISON OF ARTERIOVENOUS FISTULA Left 06/05/2013   Procedure: REVISON OF LEFT RADIOCEPHALIC  ARTERIOVENOUS FISTULA;  Surgeon: Conrad Melvindale, MD;  Location: [name];  Service: Vascular;  Laterality: Left;  . REVISON OF ARTERIOVENOUS FISTULA Left 01/27/2016   Procedure: REVISION OF LEFT UPPER EXTREMITY ARTERIOVENOUS FISTULA;  Surgeon: Waynetta Sandy, MD;  Location: [place];  Service: Vascular;  Laterality: Left;  . REVISON OF ARTERIOVENOUS FISTULA Left 08/03/2016   Procedure: REVISON OF Left arm ARTERIOVENOUS FISTULA;  Surgeon: Elam Dutch, MD;  Location: [facility]  OR;  Service: Vascular;  Laterality: Left;  . REVISON OF ARTERIOVENOUS FISTULA Left 05/06/2017   Procedure: REVISION PLICATION OF ARTERIOVENOUS FISTULA LEFT ARM;  Surgeon: Rosetta Posner, MD;  Location: [place];  Service: Vascular;  Laterality: Left;  . REVISON OF ARTERIOVENOUS FISTULA Left 02/01/2019   Procedure: REVISION OF ARTERIOVENOUS FISTULA LEFT ARM;  Surgeon: [name], MD;  Location: [place];  Service: Vascular;  Laterality: Left;  . REVISON OF ARTERIOVENOUS FISTULA Left 03/15/2019   Procedure: REVISION PLICATION OF ARTERIOVENOUS FISTULA LEFT ARM;  Surgeon: [name], MD;  Location: [place];  Service: Vascular;  Laterality: Left;  . RIGHT HEART CATHETERIZATION N/A 05/21/2011   Procedure: RIGHT HEART CATH;  Surgeon: Birdie Riddle, MD;  Location: [facility] C[facility] LAB;  Service: Cardiovascular;  Laterality: N/A;  . smashed  1990's   "left pinky; have a  plate in there"  . UMBILICAL HERNIA REPAIR N/A 07/04/2012   Procedure: LAPAROSCOPIC UMBILICAL HERNIA;  Surgeon: Madilyn Hook, DO;  Location: [place];  Service: General;  Laterality: N/A;  . US ECHOCARDIOGRAPHY  05/20/11     Current Meds  Medication Sig  . allopurinol (ZYLOPRIM) 100 MG tablet Take 1 tablet (100 mg total) by mouth daily.  Marland Kitchen amiodarone (PACERONE) 200 MG tablet Take by mouth.  . calcium acetate (PHOSLO) 667 MG capsule Take 1-3 capsules (667-2,001 mg total) by mouth See admin instructions. Take 2001 mg capsules with meals three times daily and 667 mg capsule with snacks.  . chlorhexidine (PERIDEX) 0.12 % solution 15 mLs by Mouth Rinse route daily as needed (dryness).   . ferric citrate (AURYXIA) 1 GM 210 MG(Fe) tablet Take 210 mg by mouth 3 (three) times daily with meals.   . Lidocaine, Anorectal, (L-M-X 5) 5 % CREA Apply 1 application topically 4 (four) times daily as needed (apply to area up to 6 times daily as needed). (Patient taking differently: Apply 1 application topically See admin instructions. Apply up to six times daily as needed for discomfort)  . lidocaine-prilocaine (EMLA) cream Apply 1 application topically See admin instructions. Apply topically to port access prior to dialysis - Monday, Wednesday, Friday  . loperamide (IMODIUM) 2 MG capsule Take 1 capsule (2 mg total) by mouth as needed for diarrhea or loose stools.  . midodrine (PROAMATINE) 10 MG tablet Take 10 mg by mouth 3 (three) times daily.  . multivitamin (RENA-VIT) TABS tablet Take 1 tablet by mouth at bedtime.  . nitroGLYCERIN (NITROSTAT) 0.4 MG SL tablet Place 0.4 mg under the tongue every 5 (five) minutes as needed for chest pain.   . Nutritional Supplements (FEEDING SUPPLEMENT, NEPRO CARB STEADY,) LIQD Take by mouth.  . oxyCODONE-acetaminophen (PERCOCET) 5-325 MG tablet Take 1 tablet by mouth every 6 (six) hours as needed for severe pain.  . pantoprazole (PROTONIX) 40 MG tablet Take 40 mg by mouth 2 (two)  times daily.  . sucroferric oxyhydroxide (VELPHORO) 500 MG chewable tablet Chew 1 tablet by mouth daily.  Marland Kitchen UNABLE TO FIND Out patient physical therapy- vestibular therapy for BPPV  . warfarin (COUMADIN) 2.5 MG tablet Take 3 tablets (7.5 mg total) by mouth daily at 6 PM.     Allergies:   Ace inhibitors   Social History   Tobacco Use  . Smoking status: Never Smoker  . Smokeless tobacco: Never Used  Substance Use Topics  . Alcohol use: No    Alcohol/week: 0.0 standard drinks  . Drug use: No    Types: Cocaine  Comment: Past hx-none in 20 yrs     Family Hx: The patient's family history includes Hypertension in his mother; Kidney disease in his mother.  ROS:   Please see the history of present illness.    Review of Systems  Constitutional: Negative.   Respiratory: Positive for cough (no longer having cough per patient).   Cardiovascular: Negative.   Gastrointestinal: Negative for abdominal pain.  Neurological: Negative for dizziness and headaches.  Psychiatric/Behavioral: Negative.     All other systems reviewed and are negative.   Labs/Other Tests and Data Reviewed:    Recent Labs: 06/20/2018: TSH 1.270 11/22/2018: ALT 15 01/19/2019: Platelets 188 03/15/2019: BUN 38; Creatinine, Ser 11.10; Hemoglobin 11.9; Potassium 4.5; Sodium 136   Recent Lipid Panel Lab Results  Component Value Date/Time   CHOL 214 (H) 11/09/2018 09:36 AM   TRIG 238 (H) 11/09/2018 09:36 AM   HDL 43 11/09/2018 09:36 AM   CHOLHDL 5.0 11/09/2018 09:36 AM   CHOLHDL 3.5 10/06/2012 04:10 AM   LDLCALC 123 (H) 11/09/2018 09:36 AM    Wt Readings from Last 3 Encounters:  05/31/19 252 lb (114.3 kg)  05/31/19 252 lb 9.6 oz (114.6 kg)  05/01/19 251 lb (113.9 kg)     Exam:    Vital Signs:  BP 96/63 (BP Location: Left Arm, Patient Position: Sitting, Cuff Size: Large)   Wt 252 lb (114.3 kg)   BMI 33.25 kg/m     Physical Exam  Constitutional: He is oriented to person, place, and time. No distress.    Pulmonary/Chest: No respiratory distress.  Neurological: He is oriented to person, place, and time.  Psychiatric: Mood, memory, affect and judgment normal.    ASSESSMENT & PLAN:    1. Other fatigue  This is likely related to his low blood pressure  2. Hypotension, unspecified hypotension type  He has already seen the cardiologist earlier today  I am not making any additional changes encouraged him to stay well hydrated.   COVID-19 Education: The signs and symptoms of COVID-19 were discussed with the patient and how to seek care for testing (follow up with PCP or arrange E-visit).  The importance of social distancing was discussed today.  Patient Risk:   After full review of this patients clinical status, I feel that they are at least moderate risk at this time.  Time:   Today, I have spent 7 minutes/ seconds with the patient with telehealth technology discussing above diagnoses.     Medication Adjustments/Labs and Tests Ordered: Current medicines are reviewed at length with the patient today.  Concerns regarding medicines are outlined above.   Tests Ordered: No orders of the defined types were placed in this encounter.   Medication Changes: No orders of the defined types were placed in this encounter.   Disposition:  Follow up prn  Signed, [name], FNP

## 2019-05-31 NOTE — Progress Notes (Signed)
Primary Physician/Referring:  Shelby Mattocks, PA-C  Patient ID: [name], male    DOB: [birth year], 59 y.o.   MRN: [id number]  No chief complaint on file.  HPI:    [name]  is a 59 y.o. 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  Now referred to be evaluated for dizziness and low BP. Previously followed Dixie Dials, MD. On questioning, patient still has active relationship with his cardiologist.  Patient has occasional dizziness but no syncope.  Major issue has been premature termination of dialysis due to low blood pressure.  He is tolerating all his medications well.  Accompanied by his daughter.   Past Medical History:  Diagnosis Date  . Acute respiratory failure with hypoxia ([place]) 01/13/2015  . Atrial flutter ([place])   . Bile leak, postoperative 03/15/2016  . Biliary dyskinesia 02/12/2016  . 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 Cardiomyopathy   . CHF (congestive heart failure) ([place])   . Coronary artery disease    normal coronaries by 10/10/08 cath, Cardiac Cath 08-04-12 epic.Dr. Doylene Canard follows  . Diabetes mellitus    pt. states he's borderline diabetic., no longer taking med,- off med. since 2013  . Dialysis patient [facility])    Mon-Wed-Fri([place])- Left AV fistula  . Diverticulitis November 2016   reoccurred in December 2016  . Diverticulitis of intestine without perforation or abscess without  bleeding 04/21/2015  . DVT (deep venous thrombosis) ([place])   . ESRD (end stage renal disease) ([place])   . GERD (gastroesophageal reflux disease)   . Gout   . Hemorrhage of left kidney 01/13/2018  . History of nephrectomy 07/04/2012   History of right nephrectomy in 2002 for renal cell carcinoma   . History of unilateral nephrectomy   . Hypertension   . Low iron   . Myocardial infarction [facility]) ?2006  . Renal cell carcinoma    dialysis- MWF- [address]- Dr. Mercy Moore follows.  . Renal insufficiency   . Shortness of breath 05/19/11   "at rest, lying down, w/exertion"  . Stroke [facility]) 02/2011   05/19/11 denies residual  . Umbilical hernia 19/75/88   unrepaired  . Wears glasses    Past Surgical History:  Procedure Laterality Date  . ANAL FISTULOTOMY  08/15/2018  . AV FISTULA PLACEMENT  03/29/2011   Procedure: ARTERIOVENOUS (AV) FISTULA CREATION;  Surgeon: Hinda Lenis, MD;  Location: [place];  Service: Vascular;  Laterality: Left;  LEFT RADIOCEPHALIC , Arteriovenous [id number])  . CARDIAC CATHETERIZATION  05/21/11  . CARDIAC CATHETERIZATION N/A 03/16/2016   Procedure: Left Heart Cath and Coronary Angiography;  Surgeon: Dixie Dials, MD;  Location: [place] CV LAB;  Service: Cardiovascular;  Laterality: N/A;  . CHOLECYSTECTOMY N/A 02/12/2016   Procedure: LAPAROSCOPIC CHOLECYSTECTOMY WITH INTRAOPERATIVE CHOLANGIOGRAM;  Surgeon: Jackolyn Confer, MD;  Location: [place];  Service: General;  Laterality: N/A;  . COLONOSCOPY WITH PROPOFOL N/A 02/01/2017   Procedure: COLONOSCOPY WITH PROPOFOL;  Surgeon: Juanita Craver, MD;  Location: [facility];  Service: Endoscopy;  Laterality: N/A;  . dialysis cath placed    .  ESOPHAGOGASTRODUODENOSCOPY (EGD) WITH PROPOFOL N/A 12/02/2015   Procedure: ESOPHAGOGASTRODUODENOSCOPY (EGD) WITH PROPOFOL;  Surgeon: Juanita Craver, MD;  Location: [facility];  Service: Endoscopy;  Laterality: N/A;  . EVALUATION UNDER ANESTHESIA WITH HEMORRHOIDECTOMY N/A 08/15/2018     Procedure: HEMORRHOID LIGATION/PEXY;  Surgeon: Michael Boston, MD;  Location: [place];  Service: General;  Laterality: N/A;  . FINGER SURGERY     L pinkie finger- ORIF- /w remaining hardware - 1990's    . FISTULOTOMY N/A 08/15/2018   Procedure: SUPERFICIAL ANAL FISTULOTOMY;  Surgeon: Michael Boston, MD;  Location: [place];  Service: General;  Laterality: N/A;  . HEMORRHOID SURGERY    . HERNIA REPAIR N/A [id number]/[id number]   Umbilical hernia repair  . INSERTION OF DIALYSIS CATHETER N/A 01/27/2016   Procedure: INSERTION OF DIALYSIS CATHETER;  Surgeon: Waynetta Sandy, MD;  Location: [place];  Service: Vascular;  Laterality: N/A;  . INSERTION OF MESH N/A 07/04/2012   Procedure: INSERTION OF MESH;  Surgeon: Madilyn Hook, DO;  Location: [place];  Service: General;  Laterality: N/A;  . IR EMBO ART  VEN HEMORR LYMPH EXTRAV  INC GUIDE ROADMAPPING  01/13/2018  . IR FLUORO GUIDE CV LINE RIGHT  01/13/2018  . IR IVC FILTER PLMT / S&I /IMG GUID/MOD SED  01/27/2018  . IR RENAL SELECTIVE  UNI INC S&I MOD SED  01/13/2018  . IR US GUIDE VASC ACCESS RIGHT  01/13/2018  . KIDNEY CYST REMOVAL  2019  . LAPAROSCOPIC LYSIS OF ADHESIONS N/A 02/12/2016   Procedure: LAPAROSCOPIC LYSIS OF ADHESIONS;  Surgeon: Jackolyn Confer, MD;  Location: [place];  Service: General;  Laterality: N/A;  . LEFT AND RIGHT HEART CATHETERIZATION WITH CORONARY ANGIOGRAM N/A 08/04/2012   Procedure: LEFT AND RIGHT HEART CATHETERIZATION WITH CORONARY ANGIOGRAM;  Surgeon: Birdie Riddle, MD;  Location: [place] CATH LAB;  Service: Cardiovascular;  Laterality: N/A;  . NEPHRECTOMY  2000   right  . REVISON OF ARTERIOVENOUS FISTULA Left 06/05/2013   Procedure: REVISON OF LEFT RADIOCEPHALIC  ARTERIOVENOUS FISTULA;  Surgeon: Conrad Austin, MD;  Location: [place];  Service: Vascular;  Laterality: Left;  . REVISON OF ARTERIOVENOUS FISTULA Left 01/27/2016   Procedure: REVISION OF LEFT UPPER EXTREMITY ARTERIOVENOUS FISTULA;  Surgeon: Waynetta Sandy, MD;  Location: [place];   Service: Vascular;  Laterality: Left;  . REVISON OF ARTERIOVENOUS FISTULA Left 08/03/2016   Procedure: REVISON OF Left arm ARTERIOVENOUS FISTULA;  Surgeon: Elam Dutch, MD;  Location: [place];  Service: Vascular;  Laterality: Left;  . REVISON OF ARTERIOVENOUS FISTULA Left 05/06/2017   Procedure: REVISION PLICATION OF ARTERIOVENOUS FISTULA LEFT ARM;  Surgeon: Rosetta Posner, MD;  Location: [place];  Service: Vascular;  Laterality: Left;  . REVISON OF ARTERIOVENOUS FISTULA Left 02/01/2019   Procedure: REVISION OF ARTERIOVENOUS FISTULA LEFT ARM;  Surgeon: [name], MD;  Location: [place];  Service: Vascular;  Laterality: Left;  . REVISON OF ARTERIOVENOUS FISTULA Left 03/15/2019   Procedure: REVISION PLICATION OF ARTERIOVENOUS FISTULA LEFT ARM;  Surgeon: [name], MD;  Location: [place];  Service: Vascular;  Laterality: Left;  . RIGHT HEART CATHETERIZATION N/A 05/21/2011   Procedure: RIGHT HEART CATH;  Surgeon: Birdie Riddle, MD;  Location: [facility] CATH LAB;  Service: Cardiovascular;  Laterality: N/A;  . smashed  1990's   "left pinky; have a plate in there"  . UMBILICAL HERNIA REPAIR N/A 07/04/2012   Procedure: LAPAROSCOPIC UMBILICAL HERNIA;  Surgeon: Madilyn Hook, DO;  Location: [place];  Service: General;  Laterality: N/A;  . US ECHOCARDIOGRAPHY  05/20/11   Social History   Tobacco Use  . Smoking status: Never Smoker  . Smokeless tobacco: Never Used  Substance Use Topics  . Alcohol use: No    Alcohol/week: 0.0 standard drinks     ROS  Review of Systems  Constitution: Negative for weight gain.  Cardiovascular: Negative for dyspnea on exertion, leg swelling and syncope.  Respiratory: Negative for hemoptysis.   Endocrine: Negative for cold intolerance.  Hematologic/Lymphatic: Does not bruise/bleed easily.  Gastrointestinal: Negative for hematochezia and melena.  Neurological: Positive for dizziness (occasional). Negative for headaches and light-headedness.   Objective    Blood pressure 98/62, pulse (!) 111, temperature 98.1 F (36.7 C), temperature source Temporal, resp. rate 16, height 6\' 1"  (1.854 m), weight 252 lb 9.6 oz (114.6 kg), SpO2 97 %.  Vitals with BMI 05/31/2019 05/01/2019 03/27/2019  Height 6\' 1"  6\' 1"  6\' 1"   Weight 252 lbs 10 oz 251 lbs 252 lbs  BMI 33.33 82.99 37.16  Systolic 98 89 97  Diastolic 62 63 65  Pulse 967 96 103     Physical Exam  Constitutional:  He is well-built and mildly obese in no acute distress.  Neck: No thyromegaly present.  Cardiovascular: Normal rate, regular rhythm and normal heart sounds. Exam reveals no gallop.  No murmur heard. No leg edema, no JVD.  left radiocephalic AV fistula present with bruit. No carotid bruit. Peripheral pulse feeble due to low BP.   Pulmonary/Chest: Effort normal and breath sounds normal.  Abdominal: Soft. Bowel sounds are normal.  Small pannus present  Musculoskeletal:     Cervical back: Neck supple.  Skin: Skin is warm and dry.   Laboratory examination:   Recent Labs    11/23/18 0348 01/18/19 1300 01/19/19 0631 02/01/19 1145 03/15/19 0900  NA 137 137 137 136 136  K 4.1 4.0 4.6 4.8 4.5  CL 99 94* 96*  --  99  CO2 25 26 26   --   --   GLUCOSE 90 84 103* 81 96  BUN 23* 34* 45*  --  38*  CREATININE 8.53* 10.12* 12.44*  --  11.10*  CALCIUM 8.4* 9.1 8.9  --   --   GFRNONAA 6* 5* 4*  --   --   GFRAA 7* 6* 5*  --   --    CrCl cannot be calculated (Patient's most recent lab result is older than the maximum 21 days allowed.).  CMP Latest Ref Rng & Units 03/15/2019 02/01/2019 01/19/2019  Glucose 70 - 99 mg/dL 96 81 103(H)  BUN 6 - 20 mg/dL 38(H) - 45(H)  Creatinine 0.61 - 1.24 mg/dL 11.10(H) - 12.44(H)  Sodium 135 - 145 mmol/L 136 136 137  Potassium 3.5 - 5.1 mmol/L 4.5 4.8 4.6  Chloride 98 - 111 mmol/L 99 - 96(L)  CO2 22 - 32 mmol/L - - 26  Calcium 8.9 - 10.3 mg/dL - - 8.9  Total Protein 6.5 - 8.1 g/dL - - -  Total Bilirubin 0.3 - 1.2 mg/dL - - -  Alkaline Phos 38 - 126 U/L -  - -  AST 15 - 41 U/L - - -  ALT 0 - 44 U/L - - -   CBC Latest Ref Rng & Units 03/15/2019 02/01/2019 01/19/2019  WBC 4.0 - 10.5 K/uL - - 6.1  Hemoglobin 13.0 - 17.0 g/dL 11.9(L) 10.2(L) 9.7(L)  Hematocrit 39.0 - 52.0 % 35.0(L) 30.0(L) 30.3(L)  Platelets 150 - 400 K/uL - -  188   Lipid Panel     Component Value Date/Time   CHOL 214 (H) 11/09/2018 0936   TRIG 238 (H) 11/09/2018 0936   HDL 43 11/09/2018 0936   CHOLHDL 5.0 11/09/2018 0936   CHOLHDL 3.5 10/06/2012 0410   VLDL 47 (H) 10/06/2012 0410   LDLCALC 123 (H) 11/09/2018 0936   HEMOGLOBIN A1C Lab Results  Component Value Date   HGBA1C 5.3 11/09/2018   MPG 99.67 07/31/2018   TSH Recent Labs    06/20/18 1128  TSH 1.270   Medications and allergies   Allergies  Allergen Reactions  . Ace Inhibitors Itching, Cough and Other (See Comments)     Current Outpatient Medications  Medication Instructions  . allopurinol (ZYLOPRIM) 100 mg, Oral, Daily  . amiodarone (PACERONE) 200 MG tablet Oral  . calcium acetate (PHOSLO) 667-2,001 mg, Oral, See admin instructions, Take 2001 mg capsules with meals three times daily and 667 mg capsule with snacks.  . chlorhexidine (PERIDEX) 0.12 % solution 15 mLs, Mouth Rinse, Daily PRN  . ferric citrate (AURYXIA) 210 mg, Oral, 3 times daily with meals  . Lidocaine, Anorectal, (L-M-X 5) 5 % CREA 1 application, Apply externally, 4 times daily PRN  . lidocaine-prilocaine (EMLA) cream 1 application, Topical, See admin instructions, Apply topically to port access prior to dialysis - Monday, Wednesday, Friday  . loperamide (IMODIUM) 2 mg, Oral, As needed  . midodrine (PROAMATINE) 10 mg, Oral, 3 times daily  . multivitamin (RENA-VIT) TABS tablet 1 tablet, Oral, Daily at bedtime  . nitroGLYCERIN (NITROSTAT) 0.4 mg, Sublingual, Every 5 min PRN  . Nutritional Supplements (FEEDING SUPPLEMENT, NEPRO CARB STEADY,) LIQD Oral  . oxyCODONE-acetaminophen (PERCOCET) 5-325 MG tablet 1 tablet, Oral, Every 6 hours PRN   . pantoprazole (PROTONIX) 40 mg, Oral, 2 times daily  . sucroferric oxyhydroxide (VELPHORO) 500 MG chewable tablet 1 tablet, Oral, Daily  . UNABLE TO FIND Out patient physical therapy- vestibular therapy for BPPV  . warfarin (COUMADIN) 7.5 mg, Oral, Daily-1800    Radiology:  No results found.  Cardiac Studies:   Coronary angiogram 03/16/2016:  Prox LAD lesion, 25 %stenosed.  There is moderate left ventricular systolic dysfunction.  LV end diastolic pressure is normal.  The left ventricular ejection fraction is 25-35% by visual estimate.  There is trivial (1+) mitral regurgitation.  Echocardiogram 11/23/2018:  1. The left ventricle has moderately reduced systolic function, with an ejection fraction of 35-40%. Left ventricular diffuse hypokinesis.  2. Moderate hypokinesis of the left ventricular, entire inferolateral wall.  3. Left atrial size was moderately dilated.  4. Right atrial size was mildly dilated.  5. The mitral valve is degenerative. Mild thickening of the mitral valve leaflet. There is mild mitral annular calcification present. Mitral valve regurgitation is moderate by color flow Doppler.  6. The aortic valve is tricuspid Mild thickening of the aortic valve Moderate calcification of the aortic valve. No stenosis of the aortic valve.  Assessment     ICD-10-CM   1. Hemodialysis-associated hypotension  I95.3   2. Chronic combined systolic and diastolic CHF (congestive heart failure) (HCC)  I50.42   3. Non-ischemic cardiomyopathy ([place])  I42.8   4. Paroxysmal atrial fibrillation (HCC)  I48.0    CHA2DS2-VASc Score is 3.  Yearly risk of stroke: 3.2% (HTN, Stroke).       EKG 01/18/2019: Sinus tachycardia at the rate of 100 bpm, left axis deviation, left plantar fascicular block.  LVH with repolarization abnormality, cannot exclude high lateral ischemia.    Recommendations:  No orders of the defined types were placed in this encounter.   [name]  is a 45 y.o. 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  He is presently on midodrine, may need to increase the dose to 15 mg.  Also would recommend challenging him with fludrocortisone although he does have a diagnosis of chronic systolic and diastolic heart failure, she has not had any acute decompensated heart failure.    He'll probably need repeat echocardiogram to reevaluate his LV systolic function.  Could potentially add digoxin to see whether his blood pressure may improve. Although he has low blood pressure, he is essentially asymptomatic with occasional dizziness, major issue has been premature termination of dialysis frequently. No suspicion for pericardial effusion to explain hypotension.   Weight loss was discussed extensively with the patient and his daughter, although patient has been following strict guidelines with regard to his diet, excessive calorie intake has been a major issue and he'll try to change this.   With regard to daytime somnolence and fatigue, consider sleep study.  He is presently on amiodarone for atrial fibrillation and warfarin for anticoagulation appropriately.  I did not make any changes to his medications I'll see him back on a p.r.n. basis as he has established relationship with Dr. Dixie Dials, MD  Adrian Prows, MD, [facility] 05/31/2019, 10:55  AM [facility]. [place] Office: [phone]  CC: Dixie Dials, MD (Cardiology); CC Shelby Mattocks, PA-C; [name], MD (Nephro)

## 2019-06-01 DIAGNOSIS — Z992 Dependence on renal dialysis: Secondary | ICD-10-CM | POA: Diagnosis not present

## 2019-06-01 DIAGNOSIS — D509 Iron deficiency anemia, unspecified: Secondary | ICD-10-CM | POA: Diagnosis not present

## 2019-06-01 DIAGNOSIS — N186 End stage renal disease: Secondary | ICD-10-CM | POA: Diagnosis not present

## 2019-06-01 DIAGNOSIS — N2581 Secondary hyperparathyroidism of renal origin: Secondary | ICD-10-CM | POA: Diagnosis not present

## 2019-06-01 DIAGNOSIS — D631 Anemia in chronic kidney disease: Secondary | ICD-10-CM | POA: Diagnosis not present

## 2019-06-01 LAB — PROTIME-INR: INR: 2.2 — AB (ref 0.9–1.1)

## 2019-06-04 DIAGNOSIS — D509 Iron deficiency anemia, unspecified: Secondary | ICD-10-CM | POA: Diagnosis not present

## 2019-06-04 DIAGNOSIS — D631 Anemia in chronic kidney disease: Secondary | ICD-10-CM | POA: Diagnosis not present

## 2019-06-04 DIAGNOSIS — N186 End stage renal disease: Secondary | ICD-10-CM | POA: Diagnosis not present

## 2019-06-04 DIAGNOSIS — Z992 Dependence on renal dialysis: Secondary | ICD-10-CM | POA: Diagnosis not present

## 2019-06-04 DIAGNOSIS — N2581 Secondary hyperparathyroidism of renal origin: Secondary | ICD-10-CM | POA: Diagnosis not present

## 2019-06-05 DIAGNOSIS — N2581 Secondary hyperparathyroidism of renal origin: Secondary | ICD-10-CM | POA: Diagnosis not present

## 2019-06-05 DIAGNOSIS — D509 Iron deficiency anemia, unspecified: Secondary | ICD-10-CM | POA: Diagnosis not present

## 2019-06-05 DIAGNOSIS — N186 End stage renal disease: Secondary | ICD-10-CM | POA: Diagnosis not present

## 2019-06-05 DIAGNOSIS — Z992 Dependence on renal dialysis: Secondary | ICD-10-CM | POA: Diagnosis not present

## 2019-06-05 DIAGNOSIS — D631 Anemia in chronic kidney disease: Secondary | ICD-10-CM | POA: Diagnosis not present

## 2019-06-08 ENCOUNTER — Encounter: Payer: Self-pay | Admitting: Nurse Practitioner

## 2019-06-09 DIAGNOSIS — Z992 Dependence on renal dialysis: Secondary | ICD-10-CM | POA: Diagnosis not present

## 2019-06-09 DIAGNOSIS — N186 End stage renal disease: Secondary | ICD-10-CM | POA: Diagnosis not present

## 2019-06-09 DIAGNOSIS — D509 Iron deficiency anemia, unspecified: Secondary | ICD-10-CM | POA: Diagnosis not present

## 2019-06-09 DIAGNOSIS — N2581 Secondary hyperparathyroidism of renal origin: Secondary | ICD-10-CM | POA: Diagnosis not present

## 2019-06-09 DIAGNOSIS — D631 Anemia in chronic kidney disease: Secondary | ICD-10-CM | POA: Diagnosis not present

## 2019-06-11 DIAGNOSIS — D689 Coagulation defect, unspecified: Secondary | ICD-10-CM | POA: Diagnosis not present

## 2019-06-11 DIAGNOSIS — N186 End stage renal disease: Secondary | ICD-10-CM | POA: Diagnosis not present

## 2019-06-11 DIAGNOSIS — N2581 Secondary hyperparathyroidism of renal origin: Secondary | ICD-10-CM | POA: Diagnosis not present

## 2019-06-11 DIAGNOSIS — D631 Anemia in chronic kidney disease: Secondary | ICD-10-CM | POA: Diagnosis not present

## 2019-06-11 DIAGNOSIS — Z992 Dependence on renal dialysis: Secondary | ICD-10-CM | POA: Diagnosis not present

## 2019-06-11 DIAGNOSIS — D509 Iron deficiency anemia, unspecified: Secondary | ICD-10-CM | POA: Diagnosis not present

## 2019-06-13 DIAGNOSIS — N2581 Secondary hyperparathyroidism of renal origin: Secondary | ICD-10-CM | POA: Diagnosis not present

## 2019-06-13 DIAGNOSIS — N186 End stage renal disease: Secondary | ICD-10-CM | POA: Diagnosis not present

## 2019-06-13 DIAGNOSIS — D631 Anemia in chronic kidney disease: Secondary | ICD-10-CM | POA: Diagnosis not present

## 2019-06-13 DIAGNOSIS — D509 Iron deficiency anemia, unspecified: Secondary | ICD-10-CM | POA: Diagnosis not present

## 2019-06-13 DIAGNOSIS — D689 Coagulation defect, unspecified: Secondary | ICD-10-CM | POA: Diagnosis not present

## 2019-06-13 DIAGNOSIS — Z992 Dependence on renal dialysis: Secondary | ICD-10-CM | POA: Diagnosis not present

## 2019-06-15 DIAGNOSIS — D509 Iron deficiency anemia, unspecified: Secondary | ICD-10-CM | POA: Diagnosis not present

## 2019-06-15 DIAGNOSIS — N186 End stage renal disease: Secondary | ICD-10-CM | POA: Diagnosis not present

## 2019-06-15 DIAGNOSIS — Z992 Dependence on renal dialysis: Secondary | ICD-10-CM | POA: Diagnosis not present

## 2019-06-15 DIAGNOSIS — N2581 Secondary hyperparathyroidism of renal origin: Secondary | ICD-10-CM | POA: Diagnosis not present

## 2019-06-15 DIAGNOSIS — D631 Anemia in chronic kidney disease: Secondary | ICD-10-CM | POA: Diagnosis not present

## 2019-06-18 DIAGNOSIS — N186 End stage renal disease: Secondary | ICD-10-CM | POA: Diagnosis not present

## 2019-06-18 DIAGNOSIS — Z992 Dependence on renal dialysis: Secondary | ICD-10-CM | POA: Diagnosis not present

## 2019-06-18 DIAGNOSIS — D509 Iron deficiency anemia, unspecified: Secondary | ICD-10-CM | POA: Diagnosis not present

## 2019-06-18 DIAGNOSIS — D631 Anemia in chronic kidney disease: Secondary | ICD-10-CM | POA: Diagnosis not present

## 2019-06-18 DIAGNOSIS — N2581 Secondary hyperparathyroidism of renal origin: Secondary | ICD-10-CM | POA: Diagnosis not present

## 2019-06-20 DIAGNOSIS — D631 Anemia in chronic kidney disease: Secondary | ICD-10-CM | POA: Diagnosis not present

## 2019-06-20 DIAGNOSIS — N2581 Secondary hyperparathyroidism of renal origin: Secondary | ICD-10-CM | POA: Diagnosis not present

## 2019-06-20 DIAGNOSIS — N186 End stage renal disease: Secondary | ICD-10-CM | POA: Diagnosis not present

## 2019-06-20 DIAGNOSIS — D509 Iron deficiency anemia, unspecified: Secondary | ICD-10-CM | POA: Diagnosis not present

## 2019-06-20 DIAGNOSIS — D689 Coagulation defect, unspecified: Secondary | ICD-10-CM | POA: Diagnosis not present

## 2019-06-20 DIAGNOSIS — Z992 Dependence on renal dialysis: Secondary | ICD-10-CM | POA: Diagnosis not present

## 2019-06-21 ENCOUNTER — Encounter: Payer: Self-pay | Admitting: Internal Medicine

## 2019-06-22 DIAGNOSIS — D509 Iron deficiency anemia, unspecified: Secondary | ICD-10-CM | POA: Diagnosis not present

## 2019-06-22 DIAGNOSIS — N186 End stage renal disease: Secondary | ICD-10-CM | POA: Diagnosis not present

## 2019-06-22 DIAGNOSIS — D631 Anemia in chronic kidney disease: Secondary | ICD-10-CM | POA: Diagnosis not present

## 2019-06-22 DIAGNOSIS — Z992 Dependence on renal dialysis: Secondary | ICD-10-CM | POA: Diagnosis not present

## 2019-06-22 DIAGNOSIS — N2581 Secondary hyperparathyroidism of renal origin: Secondary | ICD-10-CM | POA: Diagnosis not present

## 2019-06-25 DIAGNOSIS — N2581 Secondary hyperparathyroidism of renal origin: Secondary | ICD-10-CM | POA: Diagnosis not present

## 2019-06-25 DIAGNOSIS — Z992 Dependence on renal dialysis: Secondary | ICD-10-CM | POA: Diagnosis not present

## 2019-06-25 DIAGNOSIS — D509 Iron deficiency anemia, unspecified: Secondary | ICD-10-CM | POA: Diagnosis not present

## 2019-06-25 DIAGNOSIS — N186 End stage renal disease: Secondary | ICD-10-CM | POA: Diagnosis not present

## 2019-06-25 DIAGNOSIS — D631 Anemia in chronic kidney disease: Secondary | ICD-10-CM | POA: Diagnosis not present

## 2019-06-25 DIAGNOSIS — I120 Hypertensive chronic kidney disease with stage 5 chronic kidney disease or end stage renal disease: Secondary | ICD-10-CM | POA: Diagnosis not present

## 2019-06-27 DIAGNOSIS — N2581 Secondary hyperparathyroidism of renal origin: Secondary | ICD-10-CM | POA: Diagnosis not present

## 2019-06-27 DIAGNOSIS — I4892 Unspecified atrial flutter: Secondary | ICD-10-CM | POA: Diagnosis not present

## 2019-06-27 DIAGNOSIS — D631 Anemia in chronic kidney disease: Secondary | ICD-10-CM | POA: Diagnosis not present

## 2019-06-27 DIAGNOSIS — N186 End stage renal disease: Secondary | ICD-10-CM | POA: Diagnosis not present

## 2019-06-27 DIAGNOSIS — Z992 Dependence on renal dialysis: Secondary | ICD-10-CM | POA: Diagnosis not present

## 2019-06-27 DIAGNOSIS — D509 Iron deficiency anemia, unspecified: Secondary | ICD-10-CM | POA: Diagnosis not present

## 2019-06-28 LAB — PROTIME-INR: INR: 2.6 — AB (ref 0.9–1.1)

## 2019-06-29 DIAGNOSIS — N186 End stage renal disease: Secondary | ICD-10-CM | POA: Diagnosis not present

## 2019-06-29 DIAGNOSIS — D631 Anemia in chronic kidney disease: Secondary | ICD-10-CM | POA: Diagnosis not present

## 2019-06-29 DIAGNOSIS — N2581 Secondary hyperparathyroidism of renal origin: Secondary | ICD-10-CM | POA: Diagnosis not present

## 2019-06-29 DIAGNOSIS — D509 Iron deficiency anemia, unspecified: Secondary | ICD-10-CM | POA: Diagnosis not present

## 2019-06-29 DIAGNOSIS — Z992 Dependence on renal dialysis: Secondary | ICD-10-CM | POA: Diagnosis not present

## 2019-07-02 DIAGNOSIS — D631 Anemia in chronic kidney disease: Secondary | ICD-10-CM | POA: Diagnosis not present

## 2019-07-02 DIAGNOSIS — N2581 Secondary hyperparathyroidism of renal origin: Secondary | ICD-10-CM | POA: Diagnosis not present

## 2019-07-02 DIAGNOSIS — D509 Iron deficiency anemia, unspecified: Secondary | ICD-10-CM | POA: Diagnosis not present

## 2019-07-02 DIAGNOSIS — Z992 Dependence on renal dialysis: Secondary | ICD-10-CM | POA: Diagnosis not present

## 2019-07-02 DIAGNOSIS — N186 End stage renal disease: Secondary | ICD-10-CM | POA: Diagnosis not present

## 2019-07-04 DIAGNOSIS — N2581 Secondary hyperparathyroidism of renal origin: Secondary | ICD-10-CM | POA: Diagnosis not present

## 2019-07-04 DIAGNOSIS — N186 End stage renal disease: Secondary | ICD-10-CM | POA: Diagnosis not present

## 2019-07-04 DIAGNOSIS — D689 Coagulation defect, unspecified: Secondary | ICD-10-CM | POA: Diagnosis not present

## 2019-07-04 DIAGNOSIS — D631 Anemia in chronic kidney disease: Secondary | ICD-10-CM | POA: Diagnosis not present

## 2019-07-04 DIAGNOSIS — D509 Iron deficiency anemia, unspecified: Secondary | ICD-10-CM | POA: Diagnosis not present

## 2019-07-04 DIAGNOSIS — Z992 Dependence on renal dialysis: Secondary | ICD-10-CM | POA: Diagnosis not present

## 2019-07-05 LAB — PROTIME-INR: INR: 1.2 — AB (ref 0.9–1.1)

## 2019-07-06 DIAGNOSIS — D631 Anemia in chronic kidney disease: Secondary | ICD-10-CM | POA: Diagnosis not present

## 2019-07-06 DIAGNOSIS — D509 Iron deficiency anemia, unspecified: Secondary | ICD-10-CM | POA: Diagnosis not present

## 2019-07-06 DIAGNOSIS — N186 End stage renal disease: Secondary | ICD-10-CM | POA: Diagnosis not present

## 2019-07-06 DIAGNOSIS — N2581 Secondary hyperparathyroidism of renal origin: Secondary | ICD-10-CM | POA: Diagnosis not present

## 2019-07-06 DIAGNOSIS — Z992 Dependence on renal dialysis: Secondary | ICD-10-CM | POA: Diagnosis not present

## 2019-07-09 DIAGNOSIS — D509 Iron deficiency anemia, unspecified: Secondary | ICD-10-CM | POA: Diagnosis not present

## 2019-07-09 DIAGNOSIS — D631 Anemia in chronic kidney disease: Secondary | ICD-10-CM | POA: Diagnosis not present

## 2019-07-09 DIAGNOSIS — N186 End stage renal disease: Secondary | ICD-10-CM | POA: Diagnosis not present

## 2019-07-09 DIAGNOSIS — N2581 Secondary hyperparathyroidism of renal origin: Secondary | ICD-10-CM | POA: Diagnosis not present

## 2019-07-09 DIAGNOSIS — Z992 Dependence on renal dialysis: Secondary | ICD-10-CM | POA: Diagnosis not present

## 2019-07-11 DIAGNOSIS — D631 Anemia in chronic kidney disease: Secondary | ICD-10-CM | POA: Diagnosis not present

## 2019-07-11 DIAGNOSIS — N2581 Secondary hyperparathyroidism of renal origin: Secondary | ICD-10-CM | POA: Diagnosis not present

## 2019-07-11 DIAGNOSIS — N186 End stage renal disease: Secondary | ICD-10-CM | POA: Diagnosis not present

## 2019-07-11 DIAGNOSIS — D509 Iron deficiency anemia, unspecified: Secondary | ICD-10-CM | POA: Diagnosis not present

## 2019-07-11 DIAGNOSIS — D689 Coagulation defect, unspecified: Secondary | ICD-10-CM | POA: Diagnosis not present

## 2019-07-11 DIAGNOSIS — Z992 Dependence on renal dialysis: Secondary | ICD-10-CM | POA: Diagnosis not present

## 2019-07-11 LAB — PROTIME-INR: INR: 2.4 — AB (ref 0.9–1.1)

## 2019-07-13 DIAGNOSIS — Z992 Dependence on renal dialysis: Secondary | ICD-10-CM | POA: Diagnosis not present

## 2019-07-13 DIAGNOSIS — N2581 Secondary hyperparathyroidism of renal origin: Secondary | ICD-10-CM | POA: Diagnosis not present

## 2019-07-13 DIAGNOSIS — D509 Iron deficiency anemia, unspecified: Secondary | ICD-10-CM | POA: Diagnosis not present

## 2019-07-13 DIAGNOSIS — D631 Anemia in chronic kidney disease: Secondary | ICD-10-CM | POA: Diagnosis not present

## 2019-07-13 DIAGNOSIS — N186 End stage renal disease: Secondary | ICD-10-CM | POA: Diagnosis not present

## 2019-07-16 DIAGNOSIS — Z992 Dependence on renal dialysis: Secondary | ICD-10-CM | POA: Diagnosis not present

## 2019-07-16 DIAGNOSIS — D509 Iron deficiency anemia, unspecified: Secondary | ICD-10-CM | POA: Diagnosis not present

## 2019-07-16 DIAGNOSIS — N2581 Secondary hyperparathyroidism of renal origin: Secondary | ICD-10-CM | POA: Diagnosis not present

## 2019-07-16 DIAGNOSIS — N186 End stage renal disease: Secondary | ICD-10-CM | POA: Diagnosis not present

## 2019-07-16 DIAGNOSIS — D631 Anemia in chronic kidney disease: Secondary | ICD-10-CM | POA: Diagnosis not present

## 2019-07-18 DIAGNOSIS — N186 End stage renal disease: Secondary | ICD-10-CM | POA: Diagnosis not present

## 2019-07-18 DIAGNOSIS — D631 Anemia in chronic kidney disease: Secondary | ICD-10-CM | POA: Diagnosis not present

## 2019-07-18 DIAGNOSIS — N2581 Secondary hyperparathyroidism of renal origin: Secondary | ICD-10-CM | POA: Diagnosis not present

## 2019-07-18 DIAGNOSIS — D509 Iron deficiency anemia, unspecified: Secondary | ICD-10-CM | POA: Diagnosis not present

## 2019-07-18 DIAGNOSIS — D689 Coagulation defect, unspecified: Secondary | ICD-10-CM | POA: Diagnosis not present

## 2019-07-18 DIAGNOSIS — Z992 Dependence on renal dialysis: Secondary | ICD-10-CM | POA: Diagnosis not present

## 2019-07-19 ENCOUNTER — Encounter: Payer: Medicare Other | Admitting: Internal Medicine

## 2019-07-19 DIAGNOSIS — I5022 Chronic systolic (congestive) heart failure: Secondary | ICD-10-CM | POA: Diagnosis not present

## 2019-07-19 DIAGNOSIS — I120 Hypertensive chronic kidney disease with stage 5 chronic kidney disease or end stage renal disease: Secondary | ICD-10-CM | POA: Diagnosis not present

## 2019-07-19 DIAGNOSIS — I2510 Atherosclerotic heart disease of native coronary artery without angina pectoris: Secondary | ICD-10-CM | POA: Diagnosis not present

## 2019-07-19 DIAGNOSIS — Z7901 Long term (current) use of anticoagulants: Secondary | ICD-10-CM | POA: Diagnosis not present

## 2019-07-19 DIAGNOSIS — N186 End stage renal disease: Secondary | ICD-10-CM | POA: Diagnosis not present

## 2019-07-19 LAB — PROTIME-INR: INR: 1.4 — AB (ref 0.9–1.1)

## 2019-07-20 DIAGNOSIS — D631 Anemia in chronic kidney disease: Secondary | ICD-10-CM | POA: Diagnosis not present

## 2019-07-20 DIAGNOSIS — Z992 Dependence on renal dialysis: Secondary | ICD-10-CM | POA: Diagnosis not present

## 2019-07-20 DIAGNOSIS — N2581 Secondary hyperparathyroidism of renal origin: Secondary | ICD-10-CM | POA: Diagnosis not present

## 2019-07-20 DIAGNOSIS — N186 End stage renal disease: Secondary | ICD-10-CM | POA: Diagnosis not present

## 2019-07-20 DIAGNOSIS — D509 Iron deficiency anemia, unspecified: Secondary | ICD-10-CM | POA: Diagnosis not present

## 2019-07-23 DIAGNOSIS — Z992 Dependence on renal dialysis: Secondary | ICD-10-CM | POA: Diagnosis not present

## 2019-07-23 DIAGNOSIS — Z23 Encounter for immunization: Secondary | ICD-10-CM | POA: Diagnosis not present

## 2019-07-23 DIAGNOSIS — D631 Anemia in chronic kidney disease: Secondary | ICD-10-CM | POA: Diagnosis not present

## 2019-07-23 DIAGNOSIS — N186 End stage renal disease: Secondary | ICD-10-CM | POA: Diagnosis not present

## 2019-07-23 DIAGNOSIS — I120 Hypertensive chronic kidney disease with stage 5 chronic kidney disease or end stage renal disease: Secondary | ICD-10-CM | POA: Diagnosis not present

## 2019-07-23 DIAGNOSIS — N2581 Secondary hyperparathyroidism of renal origin: Secondary | ICD-10-CM | POA: Diagnosis not present

## 2019-07-23 DIAGNOSIS — D509 Iron deficiency anemia, unspecified: Secondary | ICD-10-CM | POA: Diagnosis not present

## 2019-07-25 DIAGNOSIS — I4892 Unspecified atrial flutter: Secondary | ICD-10-CM | POA: Diagnosis not present

## 2019-07-25 DIAGNOSIS — Z992 Dependence on renal dialysis: Secondary | ICD-10-CM | POA: Diagnosis not present

## 2019-07-25 DIAGNOSIS — D509 Iron deficiency anemia, unspecified: Secondary | ICD-10-CM | POA: Diagnosis not present

## 2019-07-25 DIAGNOSIS — N186 End stage renal disease: Secondary | ICD-10-CM | POA: Diagnosis not present

## 2019-07-25 DIAGNOSIS — N2581 Secondary hyperparathyroidism of renal origin: Secondary | ICD-10-CM | POA: Diagnosis not present

## 2019-07-25 DIAGNOSIS — D631 Anemia in chronic kidney disease: Secondary | ICD-10-CM | POA: Diagnosis not present

## 2019-07-26 ENCOUNTER — Encounter: Payer: Medicare Other | Admitting: Internal Medicine

## 2019-07-26 LAB — PROTIME-INR: INR: 1.4 — AB (ref 0.9–1.1)

## 2019-07-27 DIAGNOSIS — N2581 Secondary hyperparathyroidism of renal origin: Secondary | ICD-10-CM | POA: Diagnosis not present

## 2019-07-27 DIAGNOSIS — D509 Iron deficiency anemia, unspecified: Secondary | ICD-10-CM | POA: Diagnosis not present

## 2019-07-27 DIAGNOSIS — D631 Anemia in chronic kidney disease: Secondary | ICD-10-CM | POA: Diagnosis not present

## 2019-07-27 DIAGNOSIS — N186 End stage renal disease: Secondary | ICD-10-CM | POA: Diagnosis not present

## 2019-07-27 DIAGNOSIS — Z992 Dependence on renal dialysis: Secondary | ICD-10-CM | POA: Diagnosis not present

## 2019-07-30 DIAGNOSIS — D509 Iron deficiency anemia, unspecified: Secondary | ICD-10-CM | POA: Diagnosis not present

## 2019-07-30 DIAGNOSIS — Z992 Dependence on renal dialysis: Secondary | ICD-10-CM | POA: Diagnosis not present

## 2019-07-30 DIAGNOSIS — D631 Anemia in chronic kidney disease: Secondary | ICD-10-CM | POA: Diagnosis not present

## 2019-07-30 DIAGNOSIS — N186 End stage renal disease: Secondary | ICD-10-CM | POA: Diagnosis not present

## 2019-07-30 DIAGNOSIS — N2581 Secondary hyperparathyroidism of renal origin: Secondary | ICD-10-CM | POA: Diagnosis not present

## 2019-08-01 ENCOUNTER — Encounter: Payer: Self-pay | Admitting: Internal Medicine

## 2019-08-01 DIAGNOSIS — N186 End stage renal disease: Secondary | ICD-10-CM | POA: Diagnosis not present

## 2019-08-01 DIAGNOSIS — N2581 Secondary hyperparathyroidism of renal origin: Secondary | ICD-10-CM | POA: Diagnosis not present

## 2019-08-01 DIAGNOSIS — D631 Anemia in chronic kidney disease: Secondary | ICD-10-CM | POA: Diagnosis not present

## 2019-08-01 DIAGNOSIS — D509 Iron deficiency anemia, unspecified: Secondary | ICD-10-CM | POA: Diagnosis not present

## 2019-08-01 DIAGNOSIS — D689 Coagulation defect, unspecified: Secondary | ICD-10-CM | POA: Diagnosis not present

## 2019-08-01 DIAGNOSIS — Z992 Dependence on renal dialysis: Secondary | ICD-10-CM | POA: Diagnosis not present

## 2019-08-02 LAB — PROTIME-INR: INR: 2.2 — AB (ref 0.9–1.1)

## 2019-08-03 DIAGNOSIS — D509 Iron deficiency anemia, unspecified: Secondary | ICD-10-CM | POA: Diagnosis not present

## 2019-08-03 DIAGNOSIS — N186 End stage renal disease: Secondary | ICD-10-CM | POA: Diagnosis not present

## 2019-08-03 DIAGNOSIS — Z992 Dependence on renal dialysis: Secondary | ICD-10-CM | POA: Diagnosis not present

## 2019-08-03 DIAGNOSIS — D631 Anemia in chronic kidney disease: Secondary | ICD-10-CM | POA: Diagnosis not present

## 2019-08-03 DIAGNOSIS — N2581 Secondary hyperparathyroidism of renal origin: Secondary | ICD-10-CM | POA: Diagnosis not present

## 2019-08-06 DIAGNOSIS — N186 End stage renal disease: Secondary | ICD-10-CM | POA: Diagnosis not present

## 2019-08-06 DIAGNOSIS — D509 Iron deficiency anemia, unspecified: Secondary | ICD-10-CM | POA: Diagnosis not present

## 2019-08-06 DIAGNOSIS — Z992 Dependence on renal dialysis: Secondary | ICD-10-CM | POA: Diagnosis not present

## 2019-08-06 DIAGNOSIS — N2581 Secondary hyperparathyroidism of renal origin: Secondary | ICD-10-CM | POA: Diagnosis not present

## 2019-08-06 DIAGNOSIS — D631 Anemia in chronic kidney disease: Secondary | ICD-10-CM | POA: Diagnosis not present

## 2019-08-08 DIAGNOSIS — D631 Anemia in chronic kidney disease: Secondary | ICD-10-CM | POA: Diagnosis not present

## 2019-08-08 DIAGNOSIS — D509 Iron deficiency anemia, unspecified: Secondary | ICD-10-CM | POA: Diagnosis not present

## 2019-08-08 DIAGNOSIS — N186 End stage renal disease: Secondary | ICD-10-CM | POA: Diagnosis not present

## 2019-08-08 DIAGNOSIS — Z992 Dependence on renal dialysis: Secondary | ICD-10-CM | POA: Diagnosis not present

## 2019-08-08 DIAGNOSIS — N2581 Secondary hyperparathyroidism of renal origin: Secondary | ICD-10-CM | POA: Diagnosis not present

## 2019-08-08 DIAGNOSIS — D689 Coagulation defect, unspecified: Secondary | ICD-10-CM | POA: Diagnosis not present

## 2019-08-10 ENCOUNTER — Encounter: Payer: Self-pay | Admitting: Internal Medicine

## 2019-08-10 DIAGNOSIS — N186 End stage renal disease: Secondary | ICD-10-CM | POA: Diagnosis not present

## 2019-08-10 DIAGNOSIS — N2581 Secondary hyperparathyroidism of renal origin: Secondary | ICD-10-CM | POA: Diagnosis not present

## 2019-08-10 DIAGNOSIS — D509 Iron deficiency anemia, unspecified: Secondary | ICD-10-CM | POA: Diagnosis not present

## 2019-08-10 DIAGNOSIS — D631 Anemia in chronic kidney disease: Secondary | ICD-10-CM | POA: Diagnosis not present

## 2019-08-10 DIAGNOSIS — Z992 Dependence on renal dialysis: Secondary | ICD-10-CM | POA: Diagnosis not present

## 2019-08-13 DIAGNOSIS — N2581 Secondary hyperparathyroidism of renal origin: Secondary | ICD-10-CM | POA: Diagnosis not present

## 2019-08-13 DIAGNOSIS — N186 End stage renal disease: Secondary | ICD-10-CM | POA: Diagnosis not present

## 2019-08-13 DIAGNOSIS — D509 Iron deficiency anemia, unspecified: Secondary | ICD-10-CM | POA: Diagnosis not present

## 2019-08-13 DIAGNOSIS — Z992 Dependence on renal dialysis: Secondary | ICD-10-CM | POA: Diagnosis not present

## 2019-08-13 DIAGNOSIS — D631 Anemia in chronic kidney disease: Secondary | ICD-10-CM | POA: Diagnosis not present

## 2019-08-15 DIAGNOSIS — D509 Iron deficiency anemia, unspecified: Secondary | ICD-10-CM | POA: Diagnosis not present

## 2019-08-15 DIAGNOSIS — Z992 Dependence on renal dialysis: Secondary | ICD-10-CM | POA: Diagnosis not present

## 2019-08-15 DIAGNOSIS — D689 Coagulation defect, unspecified: Secondary | ICD-10-CM | POA: Diagnosis not present

## 2019-08-15 DIAGNOSIS — N2581 Secondary hyperparathyroidism of renal origin: Secondary | ICD-10-CM | POA: Diagnosis not present

## 2019-08-15 DIAGNOSIS — D631 Anemia in chronic kidney disease: Secondary | ICD-10-CM | POA: Diagnosis not present

## 2019-08-15 DIAGNOSIS — N186 End stage renal disease: Secondary | ICD-10-CM | POA: Diagnosis not present

## 2019-08-17 DIAGNOSIS — Z992 Dependence on renal dialysis: Secondary | ICD-10-CM | POA: Diagnosis not present

## 2019-08-17 DIAGNOSIS — N2581 Secondary hyperparathyroidism of renal origin: Secondary | ICD-10-CM | POA: Diagnosis not present

## 2019-08-17 DIAGNOSIS — D631 Anemia in chronic kidney disease: Secondary | ICD-10-CM | POA: Diagnosis not present

## 2019-08-17 DIAGNOSIS — D509 Iron deficiency anemia, unspecified: Secondary | ICD-10-CM | POA: Diagnosis not present

## 2019-08-17 DIAGNOSIS — N186 End stage renal disease: Secondary | ICD-10-CM | POA: Diagnosis not present

## 2019-08-20 DIAGNOSIS — D509 Iron deficiency anemia, unspecified: Secondary | ICD-10-CM | POA: Diagnosis not present

## 2019-08-20 DIAGNOSIS — N2581 Secondary hyperparathyroidism of renal origin: Secondary | ICD-10-CM | POA: Diagnosis not present

## 2019-08-20 DIAGNOSIS — N186 End stage renal disease: Secondary | ICD-10-CM | POA: Diagnosis not present

## 2019-08-20 DIAGNOSIS — Z992 Dependence on renal dialysis: Secondary | ICD-10-CM | POA: Diagnosis not present

## 2019-08-20 DIAGNOSIS — D631 Anemia in chronic kidney disease: Secondary | ICD-10-CM | POA: Diagnosis not present

## 2019-08-22 DIAGNOSIS — N2581 Secondary hyperparathyroidism of renal origin: Secondary | ICD-10-CM | POA: Diagnosis not present

## 2019-08-22 DIAGNOSIS — D509 Iron deficiency anemia, unspecified: Secondary | ICD-10-CM | POA: Diagnosis not present

## 2019-08-22 DIAGNOSIS — D689 Coagulation defect, unspecified: Secondary | ICD-10-CM | POA: Diagnosis not present

## 2019-08-22 DIAGNOSIS — D631 Anemia in chronic kidney disease: Secondary | ICD-10-CM | POA: Diagnosis not present

## 2019-08-22 DIAGNOSIS — N186 End stage renal disease: Secondary | ICD-10-CM | POA: Diagnosis not present

## 2019-08-22 DIAGNOSIS — Z992 Dependence on renal dialysis: Secondary | ICD-10-CM | POA: Diagnosis not present

## 2019-08-23 DIAGNOSIS — Z992 Dependence on renal dialysis: Secondary | ICD-10-CM | POA: Diagnosis not present

## 2019-08-23 DIAGNOSIS — N186 End stage renal disease: Secondary | ICD-10-CM | POA: Diagnosis not present

## 2019-08-23 DIAGNOSIS — I120 Hypertensive chronic kidney disease with stage 5 chronic kidney disease or end stage renal disease: Secondary | ICD-10-CM | POA: Diagnosis not present

## 2019-08-24 DIAGNOSIS — N2581 Secondary hyperparathyroidism of renal origin: Secondary | ICD-10-CM | POA: Diagnosis not present

## 2019-08-24 DIAGNOSIS — Z23 Encounter for immunization: Secondary | ICD-10-CM | POA: Diagnosis not present

## 2019-08-24 DIAGNOSIS — D631 Anemia in chronic kidney disease: Secondary | ICD-10-CM | POA: Diagnosis not present

## 2019-08-24 DIAGNOSIS — N186 End stage renal disease: Secondary | ICD-10-CM | POA: Diagnosis not present

## 2019-08-24 DIAGNOSIS — Z992 Dependence on renal dialysis: Secondary | ICD-10-CM | POA: Diagnosis not present

## 2019-08-24 DIAGNOSIS — D509 Iron deficiency anemia, unspecified: Secondary | ICD-10-CM | POA: Diagnosis not present

## 2019-08-27 DIAGNOSIS — Z992 Dependence on renal dialysis: Secondary | ICD-10-CM | POA: Diagnosis not present

## 2019-08-27 DIAGNOSIS — N186 End stage renal disease: Secondary | ICD-10-CM | POA: Diagnosis not present

## 2019-08-27 DIAGNOSIS — D509 Iron deficiency anemia, unspecified: Secondary | ICD-10-CM | POA: Diagnosis not present

## 2019-08-27 DIAGNOSIS — D631 Anemia in chronic kidney disease: Secondary | ICD-10-CM | POA: Diagnosis not present

## 2019-08-27 DIAGNOSIS — N2581 Secondary hyperparathyroidism of renal origin: Secondary | ICD-10-CM | POA: Diagnosis not present

## 2019-08-29 DIAGNOSIS — N186 End stage renal disease: Secondary | ICD-10-CM | POA: Diagnosis not present

## 2019-08-29 DIAGNOSIS — E1129 Type 2 diabetes mellitus with other diabetic kidney complication: Secondary | ICD-10-CM | POA: Diagnosis not present

## 2019-08-29 DIAGNOSIS — Z992 Dependence on renal dialysis: Secondary | ICD-10-CM | POA: Diagnosis not present

## 2019-08-29 DIAGNOSIS — D631 Anemia in chronic kidney disease: Secondary | ICD-10-CM | POA: Diagnosis not present

## 2019-08-29 DIAGNOSIS — I4892 Unspecified atrial flutter: Secondary | ICD-10-CM | POA: Diagnosis not present

## 2019-08-29 DIAGNOSIS — D509 Iron deficiency anemia, unspecified: Secondary | ICD-10-CM | POA: Diagnosis not present

## 2019-08-29 DIAGNOSIS — N2581 Secondary hyperparathyroidism of renal origin: Secondary | ICD-10-CM | POA: Diagnosis not present

## 2019-08-30 ENCOUNTER — Encounter: Payer: Self-pay | Admitting: Internal Medicine

## 2019-08-31 DIAGNOSIS — D631 Anemia in chronic kidney disease: Secondary | ICD-10-CM | POA: Diagnosis not present

## 2019-08-31 DIAGNOSIS — Z992 Dependence on renal dialysis: Secondary | ICD-10-CM | POA: Diagnosis not present

## 2019-08-31 DIAGNOSIS — D509 Iron deficiency anemia, unspecified: Secondary | ICD-10-CM | POA: Diagnosis not present

## 2019-08-31 DIAGNOSIS — N2581 Secondary hyperparathyroidism of renal origin: Secondary | ICD-10-CM | POA: Diagnosis not present

## 2019-08-31 DIAGNOSIS — N186 End stage renal disease: Secondary | ICD-10-CM | POA: Diagnosis not present

## 2019-09-03 DIAGNOSIS — D509 Iron deficiency anemia, unspecified: Secondary | ICD-10-CM | POA: Diagnosis not present

## 2019-09-03 DIAGNOSIS — Z992 Dependence on renal dialysis: Secondary | ICD-10-CM | POA: Diagnosis not present

## 2019-09-03 DIAGNOSIS — D631 Anemia in chronic kidney disease: Secondary | ICD-10-CM | POA: Diagnosis not present

## 2019-09-03 DIAGNOSIS — N186 End stage renal disease: Secondary | ICD-10-CM | POA: Diagnosis not present

## 2019-09-03 DIAGNOSIS — N2581 Secondary hyperparathyroidism of renal origin: Secondary | ICD-10-CM | POA: Diagnosis not present

## 2019-09-05 DIAGNOSIS — N2581 Secondary hyperparathyroidism of renal origin: Secondary | ICD-10-CM | POA: Diagnosis not present

## 2019-09-05 DIAGNOSIS — Z992 Dependence on renal dialysis: Secondary | ICD-10-CM | POA: Diagnosis not present

## 2019-09-05 DIAGNOSIS — D509 Iron deficiency anemia, unspecified: Secondary | ICD-10-CM | POA: Diagnosis not present

## 2019-09-05 DIAGNOSIS — N186 End stage renal disease: Secondary | ICD-10-CM | POA: Diagnosis not present

## 2019-09-05 DIAGNOSIS — D631 Anemia in chronic kidney disease: Secondary | ICD-10-CM | POA: Diagnosis not present

## 2019-09-05 DIAGNOSIS — D689 Coagulation defect, unspecified: Secondary | ICD-10-CM | POA: Diagnosis not present

## 2019-09-07 DIAGNOSIS — D631 Anemia in chronic kidney disease: Secondary | ICD-10-CM | POA: Diagnosis not present

## 2019-09-07 DIAGNOSIS — D509 Iron deficiency anemia, unspecified: Secondary | ICD-10-CM | POA: Diagnosis not present

## 2019-09-07 DIAGNOSIS — Z992 Dependence on renal dialysis: Secondary | ICD-10-CM | POA: Diagnosis not present

## 2019-09-07 DIAGNOSIS — N2581 Secondary hyperparathyroidism of renal origin: Secondary | ICD-10-CM | POA: Diagnosis not present

## 2019-09-07 DIAGNOSIS — N186 End stage renal disease: Secondary | ICD-10-CM | POA: Diagnosis not present

## 2019-09-10 DIAGNOSIS — N186 End stage renal disease: Secondary | ICD-10-CM | POA: Diagnosis not present

## 2019-09-10 DIAGNOSIS — N2581 Secondary hyperparathyroidism of renal origin: Secondary | ICD-10-CM | POA: Diagnosis not present

## 2019-09-10 DIAGNOSIS — D631 Anemia in chronic kidney disease: Secondary | ICD-10-CM | POA: Diagnosis not present

## 2019-09-10 DIAGNOSIS — Z992 Dependence on renal dialysis: Secondary | ICD-10-CM | POA: Diagnosis not present

## 2019-09-10 DIAGNOSIS — D509 Iron deficiency anemia, unspecified: Secondary | ICD-10-CM | POA: Diagnosis not present

## 2019-09-12 ENCOUNTER — Emergency Department (HOSPITAL_COMMUNITY): Payer: Medicare Other

## 2019-09-12 ENCOUNTER — Emergency Department (HOSPITAL_COMMUNITY)
Admission: EM | Admit: 2019-09-12 | Discharge: 2019-09-12 | Disposition: A | Discharge status: Home / Self Care | Payer: Medicare Other | Attending: Emergency Medicine | Admitting: Emergency Medicine

## 2019-09-12 ENCOUNTER — Other Ambulatory Visit: Payer: Self-pay

## 2019-09-12 ENCOUNTER — Encounter (HOSPITAL_COMMUNITY): Payer: Self-pay

## 2019-09-12 ENCOUNTER — Telehealth: Payer: Self-pay | Admitting: Nurse Practitioner

## 2019-09-12 DIAGNOSIS — R112 Nausea with vomiting, unspecified: Secondary | ICD-10-CM | POA: Diagnosis not present

## 2019-09-12 DIAGNOSIS — R05 Cough: Secondary | ICD-10-CM | POA: Insufficient documentation

## 2019-09-12 DIAGNOSIS — D631 Anemia in chronic kidney disease: Secondary | ICD-10-CM | POA: Diagnosis not present

## 2019-09-12 DIAGNOSIS — I132 Hypertensive heart and chronic kidney disease with heart failure and with stage 5 chronic kidney disease, or end stage renal disease: Secondary | ICD-10-CM | POA: Insufficient documentation

## 2019-09-12 DIAGNOSIS — Z7901 Long term (current) use of anticoagulants: Secondary | ICD-10-CM | POA: Insufficient documentation

## 2019-09-12 DIAGNOSIS — I5043 Acute on chronic combined systolic (congestive) and diastolic (congestive) heart failure: Secondary | ICD-10-CM | POA: Diagnosis not present

## 2019-09-12 DIAGNOSIS — R0602 Shortness of breath: Secondary | ICD-10-CM | POA: Diagnosis not present

## 2019-09-12 DIAGNOSIS — Z79899 Other long term (current) drug therapy: Secondary | ICD-10-CM | POA: Insufficient documentation

## 2019-09-12 DIAGNOSIS — Z992 Dependence on renal dialysis: Secondary | ICD-10-CM | POA: Diagnosis not present

## 2019-09-12 DIAGNOSIS — Z86718 Personal history of other venous thrombosis and embolism: Secondary | ICD-10-CM | POA: Insufficient documentation

## 2019-09-12 DIAGNOSIS — R1110 Vomiting, unspecified: Secondary | ICD-10-CM | POA: Diagnosis not present

## 2019-09-12 DIAGNOSIS — D509 Iron deficiency anemia, unspecified: Secondary | ICD-10-CM | POA: Diagnosis not present

## 2019-09-12 DIAGNOSIS — N186 End stage renal disease: Secondary | ICD-10-CM | POA: Insufficient documentation

## 2019-09-12 DIAGNOSIS — R059 Cough, unspecified: Secondary | ICD-10-CM

## 2019-09-12 DIAGNOSIS — D689 Coagulation defect, unspecified: Secondary | ICD-10-CM | POA: Diagnosis not present

## 2019-09-12 DIAGNOSIS — N2581 Secondary hyperparathyroidism of renal origin: Secondary | ICD-10-CM | POA: Diagnosis not present

## 2019-09-12 LAB — POCT INR
INR: 2.8 — AB (ref <=1.1)
INR: 2.8 — AB (ref <=1.1)

## 2019-09-12 LAB — PROTIME-INR: Protime: 29.1 — AB (ref 10.0–13.8)

## 2019-09-12 MED ORDER — ONDANSETRON 4 MG PO TBDP: 4.0000 mg | ORAL_TABLET | Freq: Three times a day (TID) | ORAL | 0 refills | Status: DC | PRN

## 2019-09-12 MED ORDER — FEXOFENADINE-PSEUDOEPHED ER 60-120 MG PO TB12: 1.0000 | ORAL_TABLET | Freq: Two times a day (BID) | ORAL | 0 refills | Status: DC

## 2019-09-12 MED ORDER — GUAIFENESIN 100 MG/5ML PO SYRP: 100.0000 mg | ORAL_SOLUTION | ORAL | 0 refills | Status: AC | PRN

## 2019-09-12 NOTE — ED Triage Notes (Signed)
Pt reports congestion, allergies and some SOB, denies increased swelling in extremities. Pt MWF dialysis pt, last treatment was this morning.

## 2019-09-12 NOTE — Discharge Instructions (Signed)
As discussed, your evaluation today has been largely reassuring.  But, it is important that you monitor your condition carefully, and do not hesitate to return to the ED if you develop new, or concerning changes in your condition. ? ?Otherwise, please follow-up with your physician for appropriate ongoing care. ? ?

## 2019-09-12 NOTE — ED Provider Notes (Signed)
[facility] EMERGENCY DEPARTMENT Provider Note   CSN: [id number] Arrival date & time: 09/12/19  1250     History Chief Complaint  Patient presents with  . Nasal Congestion  . Allergies    [name] is a 59 y.o. male.  HPI   Patient presents with concern for cough, vomiting. Patient has multiple medical issues including congestive heart failure, end-stage renal disease, on dialysis. He notes that over the past few weeks concurrent with allergy season has had worsening cough.  This is progressed in spite of using OTC antihistamine.  Last night, and today, he has had multiple episodes of posttussive emesis. There is no appreciable abdominal pain, no chest pain, but there is worsening generalized discomfort.  Patient was able to complete a dialysis session today. He denies fever, chills, other complaints. After this new development of emesis he presents for evaluation.  Past Medical History:  Diagnosis Date  . Acute lower GI bleeding 06/21/2018  . Acute respiratory failure with hypoxia ([place]) 01/13/2015  . Atrial flutter ([place])   . Bile leak, postoperative 03/15/2016  . Biliary dyskinesia 02/12/2016  . 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 Cardiomyopathy   . CHF (congestive heart failure) ([place])   . Coronary artery disease    normal coronaries by 10/10/08 cath, Cardiac Cath 08-04-12 epic.Dr. Doylene Canard follows  . Diabetes mellitus    pt. states he's borderline diabetic., no longer taking med,- off med. since 2013  . Dialysis patient [facility])    Mon-Wed-Fri([place])- Left AV fistula  . Diverticulitis November 2016   reoccurred in December 2016  . Diverticulitis of intestine without perforation or abscess without bleeding 04/21/2015  . DVT (deep venous thrombosis) ([place])   . ESRD (end stage renal disease) ([place])   . GERD (gastroesophageal reflux disease)   . Gout   . Hemorrhage of left kidney 01/13/2018  . History of nephrectomy  07/04/2012   History of right nephrectomy in 2002 for renal cell carcinoma   . History of unilateral nephrectomy   . Hypertension   . Low iron   . Myocardial infarction [facility]) ?2006  . Renal cell carcinoma    dialysis- MWF- [address]- Dr. Mercy Moore follows.  . Renal insufficiency   . Shortness of breath 05/19/11   "at rest, lying down, w/exertion"  . Stroke [facility]) 02/2011   05/19/11 denies residual  . Umbilical hernia 03/19/24   unrepaired  . Wears glasses     Patient Active Problem List   Diagnosis Date Noted  . Hypotension of hemodialysis 01/18/2019  . Dizziness 11/22/2018  . Hypertensive heart and CKD, ESRD on dialysis, w CHF ([place]) 11/09/2018  . 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 Supratherapeutic INR 06/20/2018  . Melena 06/20/2018  . Malnutrition of moderate degree 02/10/2018  . Anemia 01/27/2018  . Paroxysmal atrial fibrillation ([place]) 01/26/2018  . Depression 01/26/2018  . GERD (gastroesophageal reflux disease) 01/26/2018  . Pulmonary embolism ([place]) 01/26/2018  . 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  . Acute on chronic combined systolic and diastolic CHF, NYHA class 4 ([place]) 07/17/2017  . Hypotension 03/16/2017  . Paroxysmal atrial flutter ([place]) 03/16/2017  . Chronic pain 03/16/2017  . Leg pain, bilateral 03/16/2017  . Elevated troponin 03/15/2016  . Rib pain on right side   . Atrial flutter with rapid ventricular response ([place]) 06/27/2015  . Right upper quadrant abdominal pain 01/13/2015  . Diverticulitis large intestine w/o perforation or abscess w/o bleeding 12/18/2014  .  Hepatic cyst 12/18/2014  . Right sided weakness 10/05/2012  . History of nephrectomy 07/04/2012  . End-stage renal disease on hemodialysis ([place]) 06/04/2011  . Chronic combined systolic (congestive) and diastolic (congestive) heart failure ([place]) 05/19/2011    Class: Acute  . Hypertension, benign 05/19/2011    Class: Chronic  . DM II (diabetes mellitus, type II), controlled ([place]) 05/19/2011    Class:  Chronic  . Gout, chronic 05/19/2011    Past Surgical History:  Procedure Laterality Date  . ANAL FISTULOTOMY  08/15/2018  . AV FISTULA PLACEMENT  03/29/2011   Procedure: ARTERIOVENOUS (AV) FISTULA CREATION;  Surgeon: Hinda Lenis, MD;  Location: [facility];  Service: Vascular;  Laterality: Left;  LEFT RADIOCEPHALIC , Arteriovenous XAJOINO([id number])  . CARDIAC CATHETERIZATION  05/21/11  . CARDIAC CATHETERIZATION N/A 03/16/2016   Procedure: Left Heart Cath and Coronary Angiography;  Surgeon: Dixie Dials, MD;  Location: [facility] CV LAB;  Service: Cardiovascular;  Laterality: N/A;  . CHOLECYSTECTOMY N/A 02/12/2016   Procedure: LAPAROSCOPIC CHOLECYSTECTOMY WITH INTRAOPERATIVE CHOLANGIOGRAM;  Surgeon: Jackolyn Confer, MD;  Location: [facility];  Service: General;  Laterality: N/A;  . COLONOSCOPY WITH PROPOFOL N/A 02/01/2017   Procedure: COLONOSCOPY WITH PROPOFOL;  Surgeon: Juanita Craver, MD;  Location: [facility];  Service: Endoscopy;  Laterality: N/A;  . dialysis cath placed    . ESOPHAGOGASTRODUODENOSCOPY (EGD) WITH PROPOFOL N/A 12/02/2015   Procedure: ESOPHAGOGASTRODUODENOSCOPY (EGD) WITH PROPOFOL;  Surgeon: Juanita Craver, MD;  Location: [facility];  Service: Endoscopy;  Laterality: N/A;  . EVALUATION UNDER ANESTHESIA WITH HEMORRHOIDECTOMY N/A 08/15/2018   Procedure: HEMORRHOID LIGATION/PEXY;  Surgeon: Michael Boston, MD;  Location: [facility];  Service: General;  Laterality: N/A;  . FINGER SURGERY     L pinkie finger- ORIF- /w remaining hardware - 1990's    . FISTULOTOMY N/A 08/15/2018   Procedure: SUPERFICIAL ANAL FISTULOTOMY;  Surgeon: Michael Boston, MD;  Location: [facility];  Service: General;  Laterality: N/A;  . HEMORRHOID SURGERY    . HERNIA REPAIR N/A [id number]/7096   Umbilical hernia repair  . INSERTION OF DIALYSIS CATHETER N/A 01/27/2016   Procedure: INSERTION OF DIALYSIS CATHETER;  Surgeon: Waynetta Sandy, MD;  Location: [facility];  Service: Vascular;  Laterality: N/A;  . INSERTION OF MESH N/A  07/04/2012   Procedure: INSERTION OF MESH;  Surgeon: Madilyn Hook, DO;  Location: [facility];  Service: General;  Laterality: N/A;  . IR EMBO ART  VEN HEMORR LYMPH EXTRAV  INC GUIDE ROADMAPPING  01/13/2018  . IR FLUORO GUIDE CV LINE RIGHT  01/13/2018  . IR IVC FILTER PLMT / S&I /IMG GUID/MOD SED  01/27/2018  . IR RENAL SELECTIVE  UNI INC S&I MOD SED  01/13/2018  . IR US GUIDE VASC ACCESS RIGHT  01/13/2018  . KIDNEY CYST REMOVAL  2019  . LAPAROSCOPIC LYSIS OF ADHESIONS N/A 02/12/2016   Procedure: LAPAROSCOPIC LYSIS OF ADHESIONS;  Surgeon: Jackolyn Confer, MD;  Location: [facility];  Service: General;  Laterality: N/A;  . LEFT AND RIGHT HEART CATHETERIZATION WITH CORONARY ANGIOGRAM N/A 08/04/2012   Procedure: LEFT AND RIGHT HEART CATHETERIZATION WITH CORONARY ANGIOGRAM;  Surgeon: Birdie Riddle, MD;  Location: [facility] CATH LAB;  Service: Cardiovascular;  Laterality: N/A;  . NEPHRECTOMY  2000   right  . REVISON OF ARTERIOVENOUS FISTULA Left 06/05/2013   Procedure: REVISON OF LEFT RADIOCEPHALIC  ARTERIOVENOUS FISTULA;  Surgeon: Conrad Monterey, MD;  Location: [facility];  Service: Vascular;  Laterality: Left;  . REVISON OF ARTERIOVENOUS FISTULA Left 01/27/2016   Procedure:  REVISION OF LEFT UPPER EXTREMITY ARTERIOVENOUS FISTULA;  Surgeon: Waynetta Sandy, MD;  Location: [facility];  Service: Vascular;  Laterality: Left;  . REVISON OF ARTERIOVENOUS FISTULA Left 08/03/2016   Procedure: REVISON OF Left arm ARTERIOVENOUS FISTULA;  Surgeon: Elam Dutch, MD;  Location: [facility];  Service: Vascular;  Laterality: Left;  . REVISON OF ARTERIOVENOUS FISTULA Left 05/06/2017   Procedure: REVISION PLICATION OF ARTERIOVENOUS FISTULA LEFT ARM;  Surgeon: Rosetta Posner, MD;  Location: [facility];  Service: Vascular;  Laterality: Left;  . REVISON OF ARTERIOVENOUS FISTULA Left 02/01/2019   Procedure: REVISION OF ARTERIOVENOUS FISTULA LEFT ARM;  Surgeon: [name], MD;  Location: [facility];  Service: Vascular;  Laterality: Left;  . REVISON OF  ARTERIOVENOUS FISTULA Left 03/15/2019   Procedure: REVISION PLICATION OF ARTERIOVENOUS FISTULA LEFT ARM;  Surgeon: [name], MD;  Location: [facility];  Service: Vascular;  Laterality: Left;  . RIGHT HEART CATHETERIZATION N/A 05/21/2011   Procedure: RIGHT HEART CATH;  Surgeon: Birdie Riddle, MD;  Location: [facility] CATH LAB;  Service: Cardiovascular;  Laterality: N/A;  . smashed  1990's   "left pinky; have a plate in there"  . UMBILICAL HERNIA REPAIR N/A 07/04/2012   Procedure: LAPAROSCOPIC UMBILICAL HERNIA;  Surgeon: Madilyn Hook, DO;  Location: [facility];  Service: General;  Laterality: N/A;  . US ECHOCARDIOGRAPHY  05/20/11       Family History  Problem Relation Age of Onset  . Hypertension Mother   . Kidney disease Mother     Social History   Tobacco Use  . Smoking status: Never Smoker  . Smokeless tobacco: Never Used  Substance Use Topics  . Alcohol use: No    Alcohol/week: 0.0 standard drinks  . Drug use: No    Types: Cocaine    Comment: Past hx-none in 20 yrs    Home Medications Prior to Admission medications   Medication Sig Start Date End Date Taking? Authorizing Provider  allopurinol (ZYLOPRIM) 100 MG tablet Take 1 tablet (100 mg total) by mouth daily. 11/24/18  Yes Dixie Dials, MD  amiodarone (PACERONE) 200 MG tablet Take 200 mg by mouth daily.  07/05/15  Yes [provider]  calcium acetate (PHOSLO) 667 MG capsule Take 1-3 capsules (667-2,001 mg total) by mouth See admin instructions. Take 2001 mg capsules with meals three times daily and 667 mg capsule with snacks. Patient taking differently: Take 2,001 mg by mouth daily.  11/23/18  Yes Dixie Dials, MD  chlorhexidine (PERIDEX) 0.12 % solution 15 mLs by Mouth Rinse route in the morning and at bedtime.  05/29/18  Yes [provider]  cinacalcet (SENSIPAR) 30 MG tablet Take 30 mg by mouth daily.  09/03/19  Yes [provider]  ferric citrate (AURYXIA) 1 GM 210 MG(Fe) tablet Take 210 mg by mouth 3  (three) times daily with meals.    Yes [provider]  Lidocaine, Anorectal, (L-M-X 5) 5 % CREA Apply 1 application topically 4 (four) times daily as needed (apply to area up to 6 times daily as needed). Patient taking differently: Apply 1 application topically See admin instructions. Apply up to six times daily. 07/09/18  Yes Gareth Morgan, MD  lidocaine-prilocaine (EMLA) cream Apply 1 application topically See admin instructions. Apply topically to port access prior to dialysis - Monday, Wednesday, Friday. 04/08/15  Yes [provider]  loperamide (IMODIUM) 2 MG capsule Take 1 capsule (2 mg total) by mouth as needed for diarrhea or loose stools. 01/19/19  Yes Shahmehdi, Seyed A, MD  midodrine (PROAMATINE) 10 MG tablet Take 10 mg by mouth 3 (three) times daily.   Yes [provider]  multivitamin (RENA-VIT) TABS tablet Take 1 tablet by mouth at bedtime.   Yes [provider]  nitroGLYCERIN (NITROSTAT) 0.4 MG SL tablet Place 0.4 mg under the tongue every 5 (five) minutes as needed for chest pain.  05/20/15  Yes [provider]  Nutritional Supplements (FEEDING SUPPLEMENT, NEPRO CARB STEADY,) LIQD Take 237 mLs by mouth daily.  03/22/16  Yes [provider]  oxyCODONE-acetaminophen (PERCOCET) 5-325 MG tablet Take 1 tablet by mouth every 6 (six) hours as needed for severe pain. 03/15/19  Yes Rhyne, Samantha J, PA-C  pantoprazole (PROTONIX) 40 MG tablet Take 40 mg by mouth 2 (two) times daily.   Yes [provider]  sucroferric oxyhydroxide (VELPHORO) 500 MG chewable tablet Chew 500 mg by mouth daily.  10/02/18  Yes [provider]  warfarin (COUMADIN) 2.5 MG tablet Take 3 tablets (7.5 mg total) by mouth daily at 6 PM. Patient taking differently: Take 2.5 mg by mouth daily.  11/23/18  Yes Dixie Dials, MD  fexofenadine-pseudoephedrine (ALLEGRA-D) 60-120 MG 12 hr tablet Take 1 tablet by mouth every 12 (twelve) hours. 09/12/19   Carmin Muskrat, MD  guaifenesin (ROBITUSSIN) 100 MG/5ML syrup Take 5-10 mLs (100-200 mg total) by mouth every 4 (four) hours as needed for up to 7 days for cough. 09/12/19 09/19/19  Carmin Muskrat, MD  ondansetron (ZOFRAN ODT) 4 MG disintegrating tablet Take 1 tablet (4 mg total) by mouth every 8 (eight) hours as needed for nausea or vomiting. 09/12/19   Carmin Muskrat, MD  UNABLE TO FIND Out patient physical therapy- vestibular therapy for BPPV Patient not taking: Reported on 09/12/2019 08/01/18   Debbe Odea, MD    Allergies    Ace inhibitors  Review of Systems   Review of Systems  Constitutional:       Per HPI, otherwise negative  HENT:       Per HPI, otherwise negative  Respiratory:       Per HPI, otherwise negative  Cardiovascular:       Per HPI, otherwise negative  Gastrointestinal: Positive for nausea and vomiting. Negative for abdominal pain.  Endocrine:       Negative aside from HPI  Genitourinary:       Neg aside from HPI   Musculoskeletal:       Per HPI, otherwise negative  Skin: Negative.   Allergic/Immunologic: Positive for immunocompromised state.  Neurological: Negative for syncope.    Physical Exam Updated Vital Signs BP 117/75 (BP Location: Right Arm)   Pulse 85   Temp 97.9 F (36.6 C) (Oral)   Resp 15   SpO2 98%   Physical Exam Vitals and nursing note reviewed.  Constitutional:      General: He is not in acute distress.    Appearance: He is well-developed.  HENT:     Head: Normocephalic and atraumatic.  Eyes:     Conjunctiva/sclera: Conjunctivae normal.  Cardiovascular:     Rate and Rhythm: Normal rate and regular rhythm.  Pulmonary:     Effort: Pulmonary effort is normal. No respiratory distress.     Breath sounds: No stridor. No wheezing.  Abdominal:     General: There is no distension.  Skin:    General: Skin is warm and dry.  Neurological:     Mental Status: He is alert and oriented to person, place, and  time.     ED Results / Procedures  / Treatments   Labs (all labs ordered are listed, but only abnormal results are displayed) Labs Reviewed - No data to display  EKG EKG Interpretation  Date/Time:  Wednesday September 12 2019 12:54:39 EDT Ventricular Rate:  84 PR Interval:  166 QRS Duration: 96 QT Interval:  438 QTC Calculation: 517 R Axis:   -26 Text Interpretation: Normal sinus rhythm Moderate voltage criteria for LVH, may be normal variant ( R in aVL , Cornell product ) Septal infarct , age undetermined Prolonged QT Abnormal ECG Confirmed by Carmin Muskrat [phone]) on 09/12/2019 3:50:03 PM   Radiology DG Chest 2 View  Result Date: 09/12/2019 CLINICAL DATA:  Cough with nausea and vomiting EXAM: CHEST - 2 VIEW COMPARISON:  01/18/2019, 11/22/2018 FINDINGS: Mild cardiomegaly and chronic elevation left diaphragm. No acute consolidation or effusion. Aortic atherosclerosis. No pneumothorax. Vascular coils in the upper abdomen. IMPRESSION: No active cardiopulmonary disease. Mild cardiomegaly. Electronically Signed   By: Donavan Foil M.D.   On: 09/12/2019 17:17    Procedures Procedures (including critical care time)  Medications Ordered in ED Medications - No data to display  ED Course  I have reviewed the triage vital signs and the nursing notes.  Pertinent labs & imaging results that were available during my care of the patient were reviewed by me and considered in my medical decision making (see chart for details).    MDM Rules/Calculators/A&P                      5:56 PM 5:56 PM On repeat exam the patient is awake, alert, sitting upright.  He is now accompanied by his wife. We discussed all findings, and today's presentation. With no hemodynamic instability, no x-ray evidence of pneumonia, no EKG consistent with arrhythmia, no ischemia, and no abdominal pain, is little suspicion for bacteremia, sepsis, ACS, peritonitis, intra-abdominal infection, respectively. Some suspicion for cough related to seasonal  allergies with posttussive emesis, but again absent abdominal pain, no indication for advanced imaging of his abdomen.  Absent fever, no negation for labs either.  Notably, patient also completed dialysis today. Patient amenable to starting a new regimen of medication for cough, nausea, follow-up with primary care. Final Clinical Impression(s) / ED Diagnoses Final diagnoses:  Cough  Post-tussive emesis    Rx / DC Orders ED Discharge Orders         Ordered    ondansetron (ZOFRAN ODT) 4 MG disintegrating tablet  Every 8 hours PRN     09/12/19 1755    guaifenesin (ROBITUSSIN) 100 MG/5ML syrup  Every 4 hours PRN     09/12/19 1755    fexofenadine-pseudoephedrine (ALLEGRA-D) 60-120 MG 12 hr tablet  Every 12 hours     09/12/19 1755           Carmin Muskrat, MD 09/12/19 1757

## 2019-09-12 NOTE — Telephone Encounter (Signed)
Patient wife called and states he is coughing to the point of vomiting and that his back was hurting. Spoke with Doreene Burke and she advised the patient to go to the ER due to his health she is concern about that and go to the ER for further evaluation. Notified the patient wife and she said she will get him to go.

## 2019-09-14 DIAGNOSIS — N2581 Secondary hyperparathyroidism of renal origin: Secondary | ICD-10-CM | POA: Diagnosis not present

## 2019-09-14 DIAGNOSIS — D631 Anemia in chronic kidney disease: Secondary | ICD-10-CM | POA: Diagnosis not present

## 2019-09-14 DIAGNOSIS — D509 Iron deficiency anemia, unspecified: Secondary | ICD-10-CM | POA: Diagnosis not present

## 2019-09-14 DIAGNOSIS — Z992 Dependence on renal dialysis: Secondary | ICD-10-CM | POA: Diagnosis not present

## 2019-09-14 DIAGNOSIS — N186 End stage renal disease: Secondary | ICD-10-CM | POA: Diagnosis not present

## 2019-09-16 DIAGNOSIS — R05 Cough: Secondary | ICD-10-CM | POA: Diagnosis not present

## 2019-09-17 ENCOUNTER — Encounter: Payer: Self-pay | Admitting: Internal Medicine

## 2019-09-17 DIAGNOSIS — Z992 Dependence on renal dialysis: Secondary | ICD-10-CM | POA: Diagnosis not present

## 2019-09-17 DIAGNOSIS — N186 End stage renal disease: Secondary | ICD-10-CM | POA: Diagnosis not present

## 2019-09-17 DIAGNOSIS — D631 Anemia in chronic kidney disease: Secondary | ICD-10-CM | POA: Diagnosis not present

## 2019-09-17 DIAGNOSIS — D509 Iron deficiency anemia, unspecified: Secondary | ICD-10-CM | POA: Diagnosis not present

## 2019-09-17 DIAGNOSIS — N2581 Secondary hyperparathyroidism of renal origin: Secondary | ICD-10-CM | POA: Diagnosis not present

## 2019-09-18 ENCOUNTER — Other Ambulatory Visit: Payer: Self-pay

## 2019-09-18 ENCOUNTER — Encounter: Payer: Self-pay | Admitting: Nurse Practitioner

## 2019-09-18 ENCOUNTER — Telehealth (INDEPENDENT_AMBULATORY_CARE_PROVIDER_SITE_OTHER): Payer: Medicare Other | Admitting: Nurse Practitioner

## 2019-09-18 ENCOUNTER — Telehealth: Payer: Self-pay

## 2019-09-18 VITALS — Ht 73.0 in | Wt 252.0 lb

## 2019-09-18 DIAGNOSIS — J069 Acute upper respiratory infection, unspecified: Secondary | ICD-10-CM

## 2019-09-18 MED ORDER — ALBUTEROL SULFATE HFA 108 (90 BASE) MCG/ACT IN AERS: 2.0000 | INHALATION_SPRAY | Freq: Four times a day (QID) | RESPIRATORY_TRACT | 2 refills | Status: DC | PRN

## 2019-09-18 MED ORDER — HYDROCODONE-HOMATROPINE 5-1.5 MG/5ML PO SYRP: 5.0000 mL | ORAL_SOLUTION | Freq: Two times a day (BID) | ORAL | 0 refills | Status: DC | PRN

## 2019-09-18 MED ORDER — AZITHROMYCIN 250 MG PO TABS: ORAL_TABLET | ORAL | 0 refills | Status: AC

## 2019-09-18 NOTE — Telephone Encounter (Signed)
Pt understands that although there may be some limitations with this type of visit, we will take all precautions to reduce any security or privacy concerns.  Pt understands that this will be treated like an in office visit and we will file with pt's insurance, and there may be a patient responsible charge related to this service. Pt gives permission to proceed with MyChart Video Visit.

## 2019-09-18 NOTE — Progress Notes (Signed)
This visit occurred during the SARS-CoV-2 public health emergency.  Safety protocols were in place, including screening questions prior to the visit, additional usage of staff PPE, and extensive cleaning of exam room while observing appropriate contact time as indicated for disinfecting solutions.  Subjective:     Patient ID: [name] , male    DOB: March 04, 1961 , 59 y.o.   MRN: [id number]   Chief Complaint  Patient presents with  . Hospitalization Follow-up    HPI  He went to the ER on 09/12/2019 with cough, emesis.  Denies fever.  No other sick family members. He had been sick for about one week prior to going to the ER.   He is taking allegra-d, robitussin without relief. He also went to the minute clinic at [facility] on Sunday given doxycycline and prednisone. Does not use any inhalers. When he breaths in he will hear wheezing.      Past Medical History:  Diagnosis Date  . Acute lower GI bleeding 06/21/2018  . Acute respiratory failure with hypoxia ([place]) 01/13/2015  . Atrial flutter ([place])   . Bile leak, postoperative 03/15/2016  . Biliary dyskinesia 02/12/2016  . 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 Cardiomyopathy   . CHF (congestive heart failure) ([place])   . Coronary artery disease    normal coronaries by 10/10/08 cath, Cardiac Cath 08-04-12 epic.Dr. Doylene Canard follows  . Diabetes mellitus    pt. states he's borderline diabetic., no longer taking med,- off med. since 2013  . Dialysis patient [facility])    Mon-Wed-Fri([place])- Left AV fistula  . Diverticulitis November 2016   reoccurred in December 2016  . Diverticulitis of intestine without perforation or abscess without bleeding 04/21/2015  . DVT (deep venous thrombosis) ([place])   . ESRD (end stage renal disease) ([place])   . GERD (gastroesophageal reflux disease)   . Gout   . Hemorrhage of left kidney 01/13/2018  . History of nephrectomy 07/04/2012   History of right nephrectomy in 2002 for  renal cell carcinoma   . History of unilateral nephrectomy   . Hypertension   . Low iron   . Myocardial infarction [facility]) ?2006  . Renal cell carcinoma    dialysis- MWF- [address]- Dr. Mercy  follows.  . Renal insufficiency   . Shortness of breath 05/19/11   "at rest, lying down, w/exertion"  . Stroke [facility]) 02/2011   05/19/11 denies residual  . Umbilical hernia 38/18/29   unrepaired  . Wears glasses      Family History  Problem Relation Age of Onset  . Hypertension Mother   . Kidney disease Mother      Current Outpatient Medications:  .  allopurinol (ZYLOPRIM) 100 MG tablet, Take 1 tablet (100 mg total) by mouth daily., Disp: 30 tablet, Rfl: 3 .  amiodarone (PACERONE) 200 MG tablet, Take 200 mg by mouth daily. , Disp: , Rfl:  .  calcium acetate (PHOSLO) 667 MG capsule, Take 1-3 capsules (667-2,001 mg total) by mouth See admin instructions. Take 2001 mg capsules with meals three times daily and 667 mg capsule with snacks. (Patient taking differently: Take 2,001 mg by mouth daily. ), Disp:  , Rfl:  .  chlorhexidine (PERIDEX) 0.12 % solution, 15 mLs by Mouth Rinse route in the morning and at bedtime. , Disp: , Rfl:  .  cinacalcet (SENSIPAR) 30 MG tablet, Take 30 mg by mouth daily. , Disp: , Rfl:  .  ferric  citrate (AURYXIA) 1 GM 210 MG(Fe) tablet, Take 210 mg by mouth 3 (three) times daily with meals. , Disp: , Rfl:  .  fexofenadine-pseudoephedrine (ALLEGRA-D) 60-120 MG 12 hr tablet, Take 1 tablet by mouth every 12 (twelve) hours., Disp: 30 tablet, Rfl: 0 .  guaifenesin (ROBITUSSIN) 100 MG/5ML syrup, Take 5-10 mLs (100-200 mg total) by mouth every 4 (four) hours as needed for up to 7 days for cough., Disp: 60 mL, Rfl: 0 .  Lidocaine, Anorectal, (L-M-X 5) 5 % CREA, Apply 1 application topically 4 (four) times daily as needed (apply to area up to 6 times daily as needed). (Patient taking differently: Apply 1 application topically See admin instructions. Apply up to six times  daily.), Disp: 1 Tube, Rfl: 0 .  lidocaine-prilocaine (EMLA) cream, Apply 1 application topically See admin instructions. Apply topically to port access prior to dialysis - Monday, Wednesday, Friday., Disp: , Rfl: 12 .  loperamide (IMODIUM) 2 MG capsule, Take 1 capsule (2 mg total) by mouth as needed for diarrhea or loose stools., Disp: 30 capsule, Rfl: 0 .  midodrine (PROAMATINE) 10 MG tablet, Take 10 mg by mouth 3 (three) times daily., Disp: , Rfl:  .  multivitamin (RENA-VIT) TABS tablet, Take 1 tablet by mouth at bedtime., Disp: , Rfl:  .  nitroGLYCERIN (NITROSTAT) 0.4 MG SL tablet, Place 0.4 mg under the tongue every 5 (five) minutes as needed for chest pain. , Disp: , Rfl: 0 .  Nutritional Supplements (FEEDING SUPPLEMENT, NEPRO CARB STEADY,) LIQD, Take 237 mLs by mouth daily. , Disp: , Rfl:  .  ondansetron (ZOFRAN ODT) 4 MG disintegrating tablet, Take 1 tablet (4 mg total) by mouth every 8 (eight) hours as needed for nausea or vomiting., Disp: 20 tablet, Rfl: 0 .  oxyCODONE-acetaminophen (PERCOCET) 5-325 MG tablet, Take 1 tablet by mouth every 6 (six) hours as needed for severe pain., Disp: 8 tablet, Rfl: 0 .  pantoprazole (PROTONIX) 40 MG tablet, Take 40 mg by mouth 2 (two) times daily., Disp: , Rfl:  .  sucroferric oxyhydroxide (VELPHORO) 500 MG chewable tablet, Chew 500 mg by mouth daily. , Disp: , Rfl:  .  UNABLE TO FIND, Out patient physical therapy- vestibular therapy for BPPV, Disp: 1 each, Rfl: 0 .  warfarin (COUMADIN) 2.5 MG tablet, Take 3 tablets (7.5 mg total) by mouth daily at 6 PM. (Patient taking differently: Take 2.5 mg by mouth daily. ), Disp: , Rfl:    Allergies  Allergen Reactions  . Ace Inhibitors Itching, Cough and Other (See Comments)     Review of Systems  Constitutional: Negative for chills and fatigue.  Respiratory: Negative.  Negative for cough.   Cardiovascular: Negative.  Negative for chest pain, palpitations and leg swelling.  Neurological: Negative for  dizziness and headaches.  Psychiatric/Behavioral: Negative.      Today's Vitals   09/18/19 1139  Weight: 252 lb (114.3 kg)  Height: 6\' 1"  (1.854 m)   Body mass index is 33.25 kg/m.   Objective:  Physical Exam Constitutional:      General: He is not in acute distress.    Appearance: He is obese.  Cardiovascular:     Rate and Rhythm: Normal rate and regular rhythm.     Pulses: Normal pulses.     Heart sounds: Normal heart sounds.  Pulmonary:     Effort: No respiratory distress.  Skin:    Capillary Refill: Capillary refill takes less than 2 seconds.  Neurological:     General: No  focal deficit present.     Mental Status: He is alert and oriented to person, place, and time.  Psychiatric:        Mood and Affect: Mood normal.        Behavior: Behavior normal.        Thought Content: Thought content normal.        Judgment: Judgment normal.         Assessment And Plan:     1. Upper respiratory tract infection, unspecified type  Not improving, will send rx for albuterol inhaler and a zpack   cxr done in ER normal - albuterol (VENTOLIN HFA) 108 (90 Base) MCG/ACT inhaler; Inhale 2 puffs into the lungs every 6 (six) hours as needed for wheezing or shortness of breath.  Dispense: 18 g; Refill: 2 - azithromycin (ZITHROMAX) 250 MG tablet; Take 2 tablets (500 mg) on  Day 1,  followed by 1 tablet (250 mg) once daily on Days 2 through 5.  Dispense: 6 each; Refill: 0 - HYDROcodone-homatropine (HYDROMET) 5-1.5 MG/5ML syrup; Take 5 mLs by mouth 2 (two) times daily as needed.  Dispense: 120 mL; Refill: 0   [name], FNP    THE PATIENT IS ENCOURAGED TO PRACTICE SOCIAL DISTANCING DUE TO THE COVID-19 PANDEMIC.

## 2019-09-19 DIAGNOSIS — Z992 Dependence on renal dialysis: Secondary | ICD-10-CM | POA: Diagnosis not present

## 2019-09-19 DIAGNOSIS — D689 Coagulation defect, unspecified: Secondary | ICD-10-CM | POA: Diagnosis not present

## 2019-09-19 DIAGNOSIS — N186 End stage renal disease: Secondary | ICD-10-CM | POA: Diagnosis not present

## 2019-09-19 DIAGNOSIS — N2581 Secondary hyperparathyroidism of renal origin: Secondary | ICD-10-CM | POA: Diagnosis not present

## 2019-09-19 DIAGNOSIS — D509 Iron deficiency anemia, unspecified: Secondary | ICD-10-CM | POA: Diagnosis not present

## 2019-09-19 DIAGNOSIS — D631 Anemia in chronic kidney disease: Secondary | ICD-10-CM | POA: Diagnosis not present

## 2019-09-20 ENCOUNTER — Telehealth: Payer: Self-pay

## 2019-09-20 ENCOUNTER — Encounter: Payer: Self-pay | Admitting: Nurse Practitioner

## 2019-09-20 NOTE — Telephone Encounter (Signed)
Patient states he feels as if he is on edge. Spoke with NP Laurance Flatten she advised patient to cut his Coumadin in half until he finishes his antibiotics and to stop prednisone.

## 2019-09-21 DIAGNOSIS — N186 End stage renal disease: Secondary | ICD-10-CM | POA: Diagnosis not present

## 2019-09-21 DIAGNOSIS — D631 Anemia in chronic kidney disease: Secondary | ICD-10-CM | POA: Diagnosis not present

## 2019-09-21 DIAGNOSIS — Z992 Dependence on renal dialysis: Secondary | ICD-10-CM | POA: Diagnosis not present

## 2019-09-21 DIAGNOSIS — D509 Iron deficiency anemia, unspecified: Secondary | ICD-10-CM | POA: Diagnosis not present

## 2019-09-21 DIAGNOSIS — N2581 Secondary hyperparathyroidism of renal origin: Secondary | ICD-10-CM | POA: Diagnosis not present

## 2019-09-22 DIAGNOSIS — Z992 Dependence on renal dialysis: Secondary | ICD-10-CM | POA: Diagnosis not present

## 2019-09-22 DIAGNOSIS — N186 End stage renal disease: Secondary | ICD-10-CM | POA: Diagnosis not present

## 2019-09-22 DIAGNOSIS — I120 Hypertensive chronic kidney disease with stage 5 chronic kidney disease or end stage renal disease: Secondary | ICD-10-CM | POA: Diagnosis not present

## 2019-09-24 DIAGNOSIS — D509 Iron deficiency anemia, unspecified: Secondary | ICD-10-CM | POA: Diagnosis not present

## 2019-09-24 DIAGNOSIS — N186 End stage renal disease: Secondary | ICD-10-CM | POA: Diagnosis not present

## 2019-09-24 DIAGNOSIS — Z992 Dependence on renal dialysis: Secondary | ICD-10-CM | POA: Diagnosis not present

## 2019-09-24 DIAGNOSIS — N2581 Secondary hyperparathyroidism of renal origin: Secondary | ICD-10-CM | POA: Diagnosis not present

## 2019-09-24 DIAGNOSIS — D631 Anemia in chronic kidney disease: Secondary | ICD-10-CM | POA: Diagnosis not present

## 2019-09-26 DIAGNOSIS — D631 Anemia in chronic kidney disease: Secondary | ICD-10-CM | POA: Diagnosis not present

## 2019-09-26 DIAGNOSIS — N186 End stage renal disease: Secondary | ICD-10-CM | POA: Diagnosis not present

## 2019-09-26 DIAGNOSIS — D689 Coagulation defect, unspecified: Secondary | ICD-10-CM | POA: Diagnosis not present

## 2019-09-26 DIAGNOSIS — N2581 Secondary hyperparathyroidism of renal origin: Secondary | ICD-10-CM | POA: Diagnosis not present

## 2019-09-26 DIAGNOSIS — D509 Iron deficiency anemia, unspecified: Secondary | ICD-10-CM | POA: Diagnosis not present

## 2019-09-26 DIAGNOSIS — Z992 Dependence on renal dialysis: Secondary | ICD-10-CM | POA: Diagnosis not present

## 2019-09-28 DIAGNOSIS — Z992 Dependence on renal dialysis: Secondary | ICD-10-CM | POA: Diagnosis not present

## 2019-09-28 DIAGNOSIS — N186 End stage renal disease: Secondary | ICD-10-CM | POA: Diagnosis not present

## 2019-09-28 DIAGNOSIS — D631 Anemia in chronic kidney disease: Secondary | ICD-10-CM | POA: Diagnosis not present

## 2019-09-28 DIAGNOSIS — D509 Iron deficiency anemia, unspecified: Secondary | ICD-10-CM | POA: Diagnosis not present

## 2019-09-28 DIAGNOSIS — N2581 Secondary hyperparathyroidism of renal origin: Secondary | ICD-10-CM | POA: Diagnosis not present

## 2019-10-01 DIAGNOSIS — D509 Iron deficiency anemia, unspecified: Secondary | ICD-10-CM | POA: Diagnosis not present

## 2019-10-01 DIAGNOSIS — D631 Anemia in chronic kidney disease: Secondary | ICD-10-CM | POA: Diagnosis not present

## 2019-10-01 DIAGNOSIS — Z992 Dependence on renal dialysis: Secondary | ICD-10-CM | POA: Diagnosis not present

## 2019-10-01 DIAGNOSIS — N2581 Secondary hyperparathyroidism of renal origin: Secondary | ICD-10-CM | POA: Diagnosis not present

## 2019-10-01 DIAGNOSIS — N186 End stage renal disease: Secondary | ICD-10-CM | POA: Diagnosis not present

## 2019-10-03 DIAGNOSIS — N186 End stage renal disease: Secondary | ICD-10-CM | POA: Diagnosis not present

## 2019-10-03 DIAGNOSIS — Z992 Dependence on renal dialysis: Secondary | ICD-10-CM | POA: Diagnosis not present

## 2019-10-03 DIAGNOSIS — D509 Iron deficiency anemia, unspecified: Secondary | ICD-10-CM | POA: Diagnosis not present

## 2019-10-03 DIAGNOSIS — D631 Anemia in chronic kidney disease: Secondary | ICD-10-CM | POA: Diagnosis not present

## 2019-10-03 DIAGNOSIS — N2581 Secondary hyperparathyroidism of renal origin: Secondary | ICD-10-CM | POA: Diagnosis not present

## 2019-10-03 DIAGNOSIS — D689 Coagulation defect, unspecified: Secondary | ICD-10-CM | POA: Diagnosis not present

## 2019-10-04 DIAGNOSIS — N2581 Secondary hyperparathyroidism of renal origin: Secondary | ICD-10-CM | POA: Diagnosis not present

## 2019-10-04 DIAGNOSIS — N186 End stage renal disease: Secondary | ICD-10-CM | POA: Diagnosis not present

## 2019-10-04 DIAGNOSIS — Z992 Dependence on renal dialysis: Secondary | ICD-10-CM | POA: Diagnosis not present

## 2019-10-04 DIAGNOSIS — D509 Iron deficiency anemia, unspecified: Secondary | ICD-10-CM | POA: Diagnosis not present

## 2019-10-04 DIAGNOSIS — D631 Anemia in chronic kidney disease: Secondary | ICD-10-CM | POA: Diagnosis not present

## 2019-10-08 DIAGNOSIS — D631 Anemia in chronic kidney disease: Secondary | ICD-10-CM | POA: Diagnosis not present

## 2019-10-08 DIAGNOSIS — Z992 Dependence on renal dialysis: Secondary | ICD-10-CM | POA: Diagnosis not present

## 2019-10-08 DIAGNOSIS — N2581 Secondary hyperparathyroidism of renal origin: Secondary | ICD-10-CM | POA: Diagnosis not present

## 2019-10-08 DIAGNOSIS — N186 End stage renal disease: Secondary | ICD-10-CM | POA: Diagnosis not present

## 2019-10-08 DIAGNOSIS — D509 Iron deficiency anemia, unspecified: Secondary | ICD-10-CM | POA: Diagnosis not present

## 2019-10-10 DIAGNOSIS — D689 Coagulation defect, unspecified: Secondary | ICD-10-CM | POA: Diagnosis not present

## 2019-10-10 DIAGNOSIS — D509 Iron deficiency anemia, unspecified: Secondary | ICD-10-CM | POA: Diagnosis not present

## 2019-10-10 DIAGNOSIS — N186 End stage renal disease: Secondary | ICD-10-CM | POA: Diagnosis not present

## 2019-10-10 DIAGNOSIS — N2581 Secondary hyperparathyroidism of renal origin: Secondary | ICD-10-CM | POA: Diagnosis not present

## 2019-10-10 DIAGNOSIS — Z992 Dependence on renal dialysis: Secondary | ICD-10-CM | POA: Diagnosis not present

## 2019-10-10 DIAGNOSIS — D631 Anemia in chronic kidney disease: Secondary | ICD-10-CM | POA: Diagnosis not present

## 2019-10-12 DIAGNOSIS — D509 Iron deficiency anemia, unspecified: Secondary | ICD-10-CM | POA: Diagnosis not present

## 2019-10-12 DIAGNOSIS — D631 Anemia in chronic kidney disease: Secondary | ICD-10-CM | POA: Diagnosis not present

## 2019-10-12 DIAGNOSIS — N186 End stage renal disease: Secondary | ICD-10-CM | POA: Diagnosis not present

## 2019-10-12 DIAGNOSIS — N2581 Secondary hyperparathyroidism of renal origin: Secondary | ICD-10-CM | POA: Diagnosis not present

## 2019-10-12 DIAGNOSIS — Z992 Dependence on renal dialysis: Secondary | ICD-10-CM | POA: Diagnosis not present

## 2019-10-15 DIAGNOSIS — D631 Anemia in chronic kidney disease: Secondary | ICD-10-CM | POA: Diagnosis not present

## 2019-10-15 DIAGNOSIS — Z992 Dependence on renal dialysis: Secondary | ICD-10-CM | POA: Diagnosis not present

## 2019-10-15 DIAGNOSIS — N2581 Secondary hyperparathyroidism of renal origin: Secondary | ICD-10-CM | POA: Diagnosis not present

## 2019-10-15 DIAGNOSIS — N186 End stage renal disease: Secondary | ICD-10-CM | POA: Diagnosis not present

## 2019-10-15 DIAGNOSIS — D509 Iron deficiency anemia, unspecified: Secondary | ICD-10-CM | POA: Diagnosis not present

## 2019-10-16 DIAGNOSIS — I2510 Atherosclerotic heart disease of native coronary artery without angina pectoris: Secondary | ICD-10-CM | POA: Diagnosis not present

## 2019-10-16 DIAGNOSIS — Z7901 Long term (current) use of anticoagulants: Secondary | ICD-10-CM | POA: Diagnosis not present

## 2019-10-16 DIAGNOSIS — I5022 Chronic systolic (congestive) heart failure: Secondary | ICD-10-CM | POA: Diagnosis not present

## 2019-10-16 DIAGNOSIS — N186 End stage renal disease: Secondary | ICD-10-CM | POA: Diagnosis not present

## 2019-10-17 DIAGNOSIS — D509 Iron deficiency anemia, unspecified: Secondary | ICD-10-CM | POA: Diagnosis not present

## 2019-10-17 DIAGNOSIS — N2581 Secondary hyperparathyroidism of renal origin: Secondary | ICD-10-CM | POA: Diagnosis not present

## 2019-10-17 DIAGNOSIS — D689 Coagulation defect, unspecified: Secondary | ICD-10-CM | POA: Diagnosis not present

## 2019-10-17 DIAGNOSIS — Z992 Dependence on renal dialysis: Secondary | ICD-10-CM | POA: Diagnosis not present

## 2019-10-17 DIAGNOSIS — D631 Anemia in chronic kidney disease: Secondary | ICD-10-CM | POA: Diagnosis not present

## 2019-10-17 DIAGNOSIS — N186 End stage renal disease: Secondary | ICD-10-CM | POA: Diagnosis not present

## 2019-10-18 ENCOUNTER — Encounter: Payer: Self-pay | Admitting: Internal Medicine

## 2019-10-19 DIAGNOSIS — N2581 Secondary hyperparathyroidism of renal origin: Secondary | ICD-10-CM | POA: Diagnosis not present

## 2019-10-19 DIAGNOSIS — N186 End stage renal disease: Secondary | ICD-10-CM | POA: Diagnosis not present

## 2019-10-19 DIAGNOSIS — Z992 Dependence on renal dialysis: Secondary | ICD-10-CM | POA: Diagnosis not present

## 2019-10-19 DIAGNOSIS — D509 Iron deficiency anemia, unspecified: Secondary | ICD-10-CM | POA: Diagnosis not present

## 2019-10-19 DIAGNOSIS — D631 Anemia in chronic kidney disease: Secondary | ICD-10-CM | POA: Diagnosis not present

## 2019-10-22 DIAGNOSIS — N186 End stage renal disease: Secondary | ICD-10-CM | POA: Diagnosis not present

## 2019-10-22 DIAGNOSIS — Z992 Dependence on renal dialysis: Secondary | ICD-10-CM | POA: Diagnosis not present

## 2019-10-22 DIAGNOSIS — D631 Anemia in chronic kidney disease: Secondary | ICD-10-CM | POA: Diagnosis not present

## 2019-10-22 DIAGNOSIS — D509 Iron deficiency anemia, unspecified: Secondary | ICD-10-CM | POA: Diagnosis not present

## 2019-10-22 DIAGNOSIS — N2581 Secondary hyperparathyroidism of renal origin: Secondary | ICD-10-CM | POA: Diagnosis not present

## 2019-10-23 DIAGNOSIS — Z992 Dependence on renal dialysis: Secondary | ICD-10-CM | POA: Diagnosis not present

## 2019-10-23 DIAGNOSIS — N186 End stage renal disease: Secondary | ICD-10-CM | POA: Diagnosis not present

## 2019-10-23 DIAGNOSIS — I120 Hypertensive chronic kidney disease with stage 5 chronic kidney disease or end stage renal disease: Secondary | ICD-10-CM | POA: Diagnosis not present

## 2019-10-24 DIAGNOSIS — N186 End stage renal disease: Secondary | ICD-10-CM | POA: Diagnosis not present

## 2019-10-24 DIAGNOSIS — I4892 Unspecified atrial flutter: Secondary | ICD-10-CM | POA: Diagnosis not present

## 2019-10-24 DIAGNOSIS — Z992 Dependence on renal dialysis: Secondary | ICD-10-CM | POA: Diagnosis not present

## 2019-10-24 DIAGNOSIS — D631 Anemia in chronic kidney disease: Secondary | ICD-10-CM | POA: Diagnosis not present

## 2019-10-24 DIAGNOSIS — N2581 Secondary hyperparathyroidism of renal origin: Secondary | ICD-10-CM | POA: Diagnosis not present

## 2019-10-24 LAB — POCT INR: INR: 1.7 — AB (ref 0.9–1.1)

## 2019-10-26 DIAGNOSIS — N186 End stage renal disease: Secondary | ICD-10-CM | POA: Diagnosis not present

## 2019-10-26 DIAGNOSIS — N2581 Secondary hyperparathyroidism of renal origin: Secondary | ICD-10-CM | POA: Diagnosis not present

## 2019-10-26 DIAGNOSIS — Z992 Dependence on renal dialysis: Secondary | ICD-10-CM | POA: Diagnosis not present

## 2019-10-26 DIAGNOSIS — D631 Anemia in chronic kidney disease: Secondary | ICD-10-CM | POA: Diagnosis not present

## 2019-10-29 ENCOUNTER — Encounter: Payer: Self-pay | Admitting: Nurse Practitioner

## 2019-10-29 DIAGNOSIS — D631 Anemia in chronic kidney disease: Secondary | ICD-10-CM | POA: Diagnosis not present

## 2019-10-29 DIAGNOSIS — Z992 Dependence on renal dialysis: Secondary | ICD-10-CM | POA: Diagnosis not present

## 2019-10-29 DIAGNOSIS — N2581 Secondary hyperparathyroidism of renal origin: Secondary | ICD-10-CM | POA: Diagnosis not present

## 2019-10-29 DIAGNOSIS — N186 End stage renal disease: Secondary | ICD-10-CM | POA: Diagnosis not present

## 2019-10-31 ENCOUNTER — Encounter: Payer: Self-pay | Admitting: Nurse Practitioner

## 2019-10-31 ENCOUNTER — Other Ambulatory Visit: Payer: Self-pay

## 2019-10-31 ENCOUNTER — Telehealth (INDEPENDENT_AMBULATORY_CARE_PROVIDER_SITE_OTHER): Payer: Medicare Other | Admitting: Nurse Practitioner

## 2019-10-31 ENCOUNTER — Telehealth: Payer: Self-pay

## 2019-10-31 VITALS — BP 134/98 | Wt 252.0 lb

## 2019-10-31 DIAGNOSIS — N186 End stage renal disease: Secondary | ICD-10-CM | POA: Diagnosis not present

## 2019-10-31 DIAGNOSIS — N2581 Secondary hyperparathyroidism of renal origin: Secondary | ICD-10-CM | POA: Diagnosis not present

## 2019-10-31 DIAGNOSIS — R053 Chronic cough: Secondary | ICD-10-CM

## 2019-10-31 DIAGNOSIS — D631 Anemia in chronic kidney disease: Secondary | ICD-10-CM | POA: Diagnosis not present

## 2019-10-31 DIAGNOSIS — R05 Cough: Secondary | ICD-10-CM | POA: Diagnosis not present

## 2019-10-31 DIAGNOSIS — D689 Coagulation defect, unspecified: Secondary | ICD-10-CM | POA: Diagnosis not present

## 2019-10-31 DIAGNOSIS — Z992 Dependence on renal dialysis: Secondary | ICD-10-CM | POA: Diagnosis not present

## 2019-10-31 MED ORDER — PREDNISONE 10 MG (21) PO TBPK: ORAL_TABLET | ORAL | 0 refills | Status: DC

## 2019-10-31 MED ORDER — HYDROCODONE-HOMATROPINE 5-1.5 MG/5ML PO SYRP: 5.0000 mL | ORAL_SOLUTION | Freq: Two times a day (BID) | ORAL | 0 refills | Status: DC | PRN

## 2019-10-31 NOTE — Progress Notes (Signed)
Virtual Visit via Video   This visit type was conducted due to national recommendations for restrictions regarding the COVID-19 Pandemic (e.g. social distancing) in an effort to limit this patient's exposure and mitigate transmission in our community.  Due to his co-morbid illnesses, this patient is at least at moderate risk for complications without adequate follow up.  This format is felt to be most appropriate for this patient at this time.  All issues noted in this document were discussed and addressed.  A limited physical exam was performed with this format.    This visit type was conducted due to national recommendations for restrictions regarding the COVID-19 Pandemic (e.g. social distancing) in an effort to limit this patient's exposure and mitigate transmission in our community.  Patients identity confirmed using two different identifiers.  This format is felt to be most appropriate for this patient at this time.  All issues noted in this document were discussed and addressed.  No physical exam was performed (except for noted visual exam findings with Video Visits).    Date:  11/16/2019   ID:  PHINNEAS SHAKOOR, DOB 1960/10/29, MRN [id number]  Patient Location:  Home - spoke with [name]  Provider location:   Office    Chief Complaint:  Cough   History of Present Illness:    CLAUDE WALDMAN is a 59 y.o. male who presents via video conferencing for a telehealth visit today.    The patient does not have symptoms concerning for COVID-19 infection (fever, chills, cough, or new shortness of breath).   He will cough so much to the point of vomiting clear secretions.  Complains of chest "pinching", reports it makes him cough when he takes a deep breath.  He has his coumadin managed by Dr. Doylene Canard tells him to take an extra tablet (if under 2.5 take an extra dose).    Cough This is a recurrent problem. The current episode started 1 to 4 weeks ago (2-3 weeks ago). The cough is productive  of sputum. Pertinent negatives include no chest pain, fever, sweats or wheezing.     Past Medical History:  Diagnosis Date  . Acute lower GI bleeding 06/21/2018  . Acute respiratory failure with hypoxia ([place]) 01/13/2015  . Atrial flutter ([place])   . Bile leak, postoperative 03/15/2016  . Biliary dyskinesia 02/12/2016  . 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 Cardiomyopathy   . CHF (congestive heart failure) ([place])   . Coronary artery disease    normal coronaries by 10/10/08 cath, Cardiac Cath 08-04-12 epic.Dr. Doylene Canard follows  . Diabetes mellitus    pt. states he's borderline diabetic., no longer taking med,- off med. since 2013  . Dialysis patient [facility])    Mon-Wed-Fri([place])- Left AV fistula  . Diverticulitis November 2016   reoccurred in December 2016  . Diverticulitis of intestine without perforation or abscess without bleeding 04/21/2015  . DVT (deep venous thrombosis) ([place])   . ESRD (end stage renal disease) ([place])   . GERD (gastroesophageal reflux disease)   . Gout   . Hemorrhage of left kidney 01/13/2018  . History of nephrectomy 07/04/2012   History of right nephrectomy in 2002 for renal cell carcinoma   . History of unilateral nephrectomy   . Hypertension   . Low iron   . Myocardial infarction [facility]) ?2006  . Renal cell carcinoma    dialysis- MWF- [address]- Dr. Mercy  follows.  . Renal insufficiency   .  Shortness of breath 05/19/11   "at rest, lying down, w/exertion"  . Stroke [facility]) 02/2011   05/19/11 denies residual  . Umbilical hernia 63/01/60   unrepaired  . Wears glasses    Past Surgical History:  Procedure Laterality Date  . ANAL FISTULOTOMY  08/15/2018  . AV FISTULA PLACEMENT  03/29/2011   Procedure: ARTERIOVENOUS (AV) FISTULA CREATION;  Surgeon: Hinda Lenis, MD;  Location: [place];  Service: Vascular;  Laterality: Left;  LEFT RADIOCEPHALIC , Arteriovenous FUXNATF([id number])  . CARDIAC CATHETERIZATION  05/21/11  .  CARDIAC CATHETERIZATION N/A 03/16/2016   Procedure: Left Heart Cath and Coronary Angiography;  Surgeon: Dixie Dials, MD;  Location: [place] CV LAB;  Service: Cardiovascular;  Laterality: N/A;  . CHOLECYSTECTOMY N/A 02/12/2016   Procedure: LAPAROSCOPIC CHOLECYSTECTOMY WITH INTRAOPERATIVE CHOLANGIOGRAM;  Surgeon: Jackolyn Confer, MD;  Location: [place];  Service: General;  Laterality: N/A;  . COLONOSCOPY WITH PROPOFOL N/A 02/01/2017   Procedure: COLONOSCOPY WITH PROPOFOL;  Surgeon: Juanita Craver, MD;  Location: [facility] ENDOS[facility];  Service: Endoscopy;  Laterality: N/A;  . dialysis cath placed    . ESOPHAGOGASTRODUODENOSCOPY (EGD) WITH PROPOFOL N/A 12/02/2015   Procedure: ESOPHAGOGASTRODUODENOSCOPY (EGD) WITH PROPOFOL;  Surgeon: Juanita Craver, MD;  Location: [facility];  Service: Endoscopy;  Laterality: N/A;  . EVALUATION UNDER ANESTHESIA WITH HEMORRHOIDECTOMY N/A 08/15/2018   Procedure: HEMORRHOID LIGATION/PEXY;  Surgeon: Michael Boston, MD;  Location: [place];  Service: General;  Laterality: N/A;  . FINGER SURGERY     L pinkie finger- ORIF- /w remaining hardware - 1990's    . FISTULOTOMY N/A 08/15/2018   Procedure: SUPERFICIAL ANAL FISTULOTOMY;  Surgeon: Michael Boston, MD;  Location: [place];  Service: General;  Laterality: N/A;  . HEMORRHOID SURGERY    . HERNIA REPAIR N/A 0/25/4270   Umbilical hernia repair  . INSERTION OF DIALYSIS CATHETER N/A 01/27/2016   Procedure: INSERTION OF DIALYSIS CATHETER;  Surgeon: Waynetta Sandy, MD;  Location: [place];  Service: Vascular;  Laterality: N/A;  . INSERTION OF MESH N/A 07/04/2012   Procedure: INSERTION OF MESH;  Surgeon: Madilyn Hook, DO;  Location: [place];  Service: General;  Laterality: N/A;  . IR EMBO ART  VEN HEMORR LYMPH EXTRAV  INC GUIDE ROADMAPPING  01/13/2018  . IR FLUORO GUIDE CV LINE RIGHT  01/13/2018  . IR IVC FILTER PLMT / S&I /IMG GUID/MOD SED  01/27/2018  . IR RENAL SELECTIVE  UNI INC S&I MOD SED  01/13/2018  . IR US GUIDE VASC ACCESS RIGHT   01/13/2018  . KIDNEY CYST REMOVAL  2019  . LAPAROSCOPIC LYSIS OF ADHESIONS N/A 02/12/2016   Procedure: LAPAROSCOPIC LYSIS OF ADHESIONS;  Surgeon: Jackolyn Confer, MD;  Location: [place];  Service: General;  Laterality: N/A;  . LEFT AND RIGHT HEART CATHETERIZATION WITH CORONARY ANGIOGRAM N/A 08/04/2012   Procedure: LEFT AND RIGHT HEART CATHETERIZATION WITH CORONARY ANGIOGRAM;  Surgeon: Birdie Riddle, MD;  Location: [place] CATH LAB;  Service: Cardiovascular;  Laterality: N/A;  . NEPHRECTOMY  2000   right  . REVISON OF ARTERIOVENOUS FISTULA Left 06/05/2013   Procedure: REVISON OF LEFT RADIOCEPHALIC  ARTERIOVENOUS FISTULA;  Surgeon: Conrad [place], MD;  Location: [place];  Service: Vascular;  Laterality: Left;  . REVISON OF ARTERIOVENOUS FISTULA Left 01/27/2016   Procedure: REVISION OF LEFT UPPER EXTREMITY ARTERIOVENOUS FISTULA;  Surgeon: Waynetta Sandy, MD;  Location: [place];  Service: Vascular;  Laterality: Left;  . REVISON OF ARTERIOVENOUS FISTULA Left 08/03/2016   Procedure: REVISON OF Left arm ARTERIOVENOUS FISTULA;  Surgeon: Elam Dutch, MD;  Location: [place];  Service: Vascular;  Laterality: Left;  . REVISON OF ARTERIOVENOUS FISTULA Left 05/06/2017   Procedure: REVISION PLICATION OF ARTERIOVENOUS FISTULA LEFT ARM;  Surgeon: Rosetta Posner, MD;  Location: [place];  Service: Vascular;  Laterality: Left;  . REVISON OF ARTERIOVENOUS FISTULA Left 02/01/2019   Procedure: REVISION OF ARTERIOVENOUS FISTULA LEFT ARM;  Surgeon: [name], MD;  Location: [place];  Service: Vascular;  Laterality: Left;  . REVISON OF ARTERIOVENOUS FISTULA Left 03/15/2019   Procedure: REVISION PLICATION OF ARTERIOVENOUS FISTULA LEFT ARM;  Surgeon: [name], MD;  Location: [place];  Service: Vascular;  Laterality: Left;  . RIGHT HEART CATHETERIZATION N/A 05/21/2011   Procedure: RIGHT HEART CATH;  Surgeon: Birdie Riddle, MD;  Location: [facility] CATH LAB;  Service: Cardiovascular;  Laterality: N/A;  . smashed  1990's     "left pinky; have a plate in there"  . UMBILICAL HERNIA REPAIR N/A 07/04/2012   Procedure: LAPAROSCOPIC UMBILICAL HERNIA;  Surgeon: Madilyn Hook, DO;  Location: [place];  Service: General;  Laterality: N/A;  . US ECHOCARDIOGRAPHY  05/20/11     Current Meds  Medication Sig  . albuterol (VENTOLIN HFA) 108 (90 Base) MCG/ACT inhaler Inhale 2 puffs into the lungs every 6 (six) hours as needed for wheezing or shortness of breath.  . allopurinol (ZYLOPRIM) 100 MG tablet Take 1 tablet (100 mg total) by mouth daily.  Marland Kitchen amiodarone (PACERONE) 200 MG tablet Take 200 mg by mouth daily.   . calcium acetate (PHOSLO) 667 MG capsule Take 1-3 capsules (667-2,001 mg total) by mouth See admin instructions. Take 2001 mg capsules with meals three times daily and 667 mg capsule with snacks. (Patient taking differently: Take 2,001 mg by mouth daily. )  . chlorhexidine (PERIDEX) 0.12 % solution 15 mLs by Mouth Rinse route in the morning and at bedtime.   . cinacalcet (SENSIPAR) 30 MG tablet Take 30 mg by mouth daily.   . ferric citrate (AURYXIA) 1 GM 210 MG(Fe) tablet Take 210 mg by mouth 3 (three) times daily with meals.   . fexofenadine-pseudoephedrine (ALLEGRA-D) 60-120 MG 12 hr tablet Take 1 tablet by mouth every 12 (twelve) hours.  Marland Kitchen HYDROcodone-homatropine (HYDROMET) 5-1.5 MG/5ML syrup Take 5 mLs by mouth 2 (two) times daily as needed. (Patient not taking: Reported on 11/14/2019)  . Lidocaine, Anorectal, (L-M-X 5) 5 % CREA Apply 1 application topically 4 (four) times daily as needed (apply to area up to 6 times daily as needed). (Patient taking differently: Apply 1 application topically See admin instructions. Apply up to six times daily.)  . lidocaine-prilocaine (EMLA) cream Apply 1 application topically See admin instructions. Apply topically to port access prior to dialysis - Monday, Wednesday, Friday.  . midodrine (PROAMATINE) 10 MG tablet Take 10 mg by mouth 3 (three) times daily.  . multivitamin (RENA-VIT)  TABS tablet Take 1 tablet by mouth at bedtime.  . nitroGLYCERIN (NITROSTAT) 0.4 MG SL tablet Place 0.4 mg under the tongue every 5 (five) minutes as needed for chest pain.   . Nutritional Supplements (FEEDING SUPPLEMENT, NEPRO CARB STEADY,) LIQD Take 237 mLs by mouth daily.   . ondansetron (ZOFRAN ODT) 4 MG disintegrating tablet Take 1 tablet (4 mg total) by mouth every 8 (eight) hours as needed for nausea or vomiting.  . pantoprazole (PROTONIX) 40 MG tablet Take 40 mg by mouth 2 (two) times daily.  . sucroferric oxyhydroxide (VELPHORO) 500 MG chewable tablet Chew 500 mg  by mouth daily.   Marland Kitchen UNABLE TO FIND Out patient physical therapy- vestibular therapy for BPPV  . UNABLE TO FIND Med Name: Lahoma Crocker as needed  . warfarin (COUMADIN) 2.5 MG tablet Take 3 tablets (7.5 mg total) by mouth daily at 6 PM. (Patient taking differently: Take 2.5 mg by mouth daily. )  . [DISCONTINUED] ciprofloxacin (CIPRO) 500 MG tablet Take 500 mg by mouth 2 (two) times daily.  . [DISCONTINUED] HYDROcodone-homatropine (HYDROMET) 5-1.5 MG/5ML syrup Take 5 mLs by mouth 2 (two) times daily as needed.     Allergies:   Ace inhibitors   Social History   Tobacco Use  . Smoking status: Never Smoker  . Smokeless tobacco: Never Used  Vaping Use  . Vaping Use: Never used  Substance Use Topics  . Alcohol use: No    Alcohol/week: 0.0 standard drinks  . Drug use: No    Types: Cocaine    Comment: Past hx-none in 20 yrs     Family Hx: The patient's family history includes Hypertension in his mother; Kidney disease in his mother.  ROS:   Please see the history of present illness.    Review of Systems  Constitutional: Negative for fever and malaise/fatigue.  Respiratory: Positive for cough. Negative for sputum production (clear secretions ) and wheezing.        He will cough until he "vomits" clear sputum  Cardiovascular: Negative for chest pain.  Neurological: Negative for dizziness and tingling.   Psychiatric/Behavioral: Negative.     All other systems reviewed and are negative.   Labs/Other Tests and Data Reviewed:    Recent Labs: 01/19/2019: Platelets 188 03/15/2019: Hemoglobin 11.9 11/15/2019: ALT 12; BUN 33; Creatinine, Ser 10.06; Potassium 4.9; Sodium 138   Recent Lipid Panel Lab Results  Component Value Date/Time   CHOL 200 (H) 11/15/2019 10:12 AM   TRIG 199 (H) 11/15/2019 10:12 AM   HDL 53 11/15/2019 10:12 AM   CHOLHDL 3.8 11/15/2019 10:12 AM   CHOLHDL 3.5 10/06/2012 04:10 AM   LDLCALC 112 (H) 11/15/2019 10:12 AM    Wt Readings from Last 3 Encounters:  11/15/19 256 lb (116.1 kg)  11/15/19 256 lb 6.4 oz (116.3 kg)  10/31/19 251 lb 15.8 oz (114.3 kg)     Exam:    Vital Signs:  BP (!) 134/98 (BP Location: Left Arm, Patient Position: Sitting, Cuff Size: Large)   Wt 251 lb 15.8 oz (114.3 kg)   BMI 33.25 kg/m     Physical Exam  Constitutional: He is well-developed, well-nourished, and in no distress. No distress.  Pulmonary/Chest: Effort normal and breath sounds normal. No respiratory distress.  Psychiatric: Mood, memory, affect and judgment normal.    ASSESSMENT & PLAN:    1. Persistent cough  He has been treated with antibiotics and steroids previously will obtain a chest xray - DG Chest 2 View; Future - HYDROcodone-homatropine (HYDROMET) 5-1.5 MG/5ML syrup; Take 5 mLs by mouth 2 (two) times daily as needed. (Patient not taking: Reported on 11/14/2019)  Dispense: 120 mL; Refill: 0    COVID-19 Education: The signs and symptoms of COVID-19 were discussed with the patient and how to seek care for testing (follow up with PCP or arrange E-visit).  The importance of social distancing was discussed today.  Patient Risk:   After full review of this patients clinical status, I feel that they are at least moderate risk at this time.  Time:   Today, I have spent 11 minutes/ seconds with the patient with telehealth  technology discussing above diagnoses.      Medication Adjustments/Labs and Tests Ordered: Current medicines are reviewed at length with the patient today.  Concerns regarding medicines are outlined above.   Tests Ordered: Orders Placed This Encounter  Procedures  . DG Chest 2 View    Medication Changes: Meds ordered this encounter  Medications  . DISCONTD: predniSONE (STERAPRED UNI-PAK 21 TAB) 10 MG (21) TBPK tablet    Sig: Take as directed    Dispense:  21 tablet    Refill:  0  . HYDROcodone-homatropine (HYDROMET) 5-1.5 MG/5ML syrup    Sig: Take 5 mLs by mouth 2 (two) times daily as needed.    Dispense:  120 mL    Refill:  0    Disposition:  Follow up prn  Signed, [name], FNP

## 2019-10-31 NOTE — Telephone Encounter (Signed)
.   Pt understands that although there may be some limitations with this type of visit, we will take all precautions to reduce any security or privacy concerns.  Pt understands that this will be treated like an in office visit and we will file with pt's insurance, and there may be a patient responsible charge related to this service.   Verbal consent given for virtual visit

## 2019-11-01 ENCOUNTER — Ambulatory Visit
Admission: RE | Admit: 2019-11-01 | Discharge: 2019-11-01 | Disposition: A | Discharge status: Home / Self Care | Payer: Medicare Other | Source: Ambulatory Visit | Attending: Nurse Practitioner | Admitting: Nurse Practitioner

## 2019-11-01 ENCOUNTER — Other Ambulatory Visit: Payer: Self-pay

## 2019-11-01 DIAGNOSIS — R053 Chronic cough: Secondary | ICD-10-CM

## 2019-11-01 DIAGNOSIS — R05 Cough: Secondary | ICD-10-CM | POA: Diagnosis not present

## 2019-11-01 LAB — POCT INR: INR: 1.8 — AB (ref 0.9–1.1)

## 2019-11-02 DIAGNOSIS — N2581 Secondary hyperparathyroidism of renal origin: Secondary | ICD-10-CM | POA: Diagnosis not present

## 2019-11-02 DIAGNOSIS — Z992 Dependence on renal dialysis: Secondary | ICD-10-CM | POA: Diagnosis not present

## 2019-11-02 DIAGNOSIS — D631 Anemia in chronic kidney disease: Secondary | ICD-10-CM | POA: Diagnosis not present

## 2019-11-02 DIAGNOSIS — N186 End stage renal disease: Secondary | ICD-10-CM | POA: Diagnosis not present

## 2019-11-05 ENCOUNTER — Encounter: Payer: Self-pay | Admitting: Nurse Practitioner

## 2019-11-05 DIAGNOSIS — N2581 Secondary hyperparathyroidism of renal origin: Secondary | ICD-10-CM | POA: Diagnosis not present

## 2019-11-05 DIAGNOSIS — Z992 Dependence on renal dialysis: Secondary | ICD-10-CM | POA: Diagnosis not present

## 2019-11-05 DIAGNOSIS — D631 Anemia in chronic kidney disease: Secondary | ICD-10-CM | POA: Diagnosis not present

## 2019-11-05 DIAGNOSIS — N186 End stage renal disease: Secondary | ICD-10-CM | POA: Diagnosis not present

## 2019-11-07 DIAGNOSIS — D689 Coagulation defect, unspecified: Secondary | ICD-10-CM | POA: Diagnosis not present

## 2019-11-07 DIAGNOSIS — D631 Anemia in chronic kidney disease: Secondary | ICD-10-CM | POA: Diagnosis not present

## 2019-11-07 DIAGNOSIS — N2581 Secondary hyperparathyroidism of renal origin: Secondary | ICD-10-CM | POA: Diagnosis not present

## 2019-11-07 DIAGNOSIS — N186 End stage renal disease: Secondary | ICD-10-CM | POA: Diagnosis not present

## 2019-11-07 DIAGNOSIS — Z992 Dependence on renal dialysis: Secondary | ICD-10-CM | POA: Diagnosis not present

## 2019-11-08 ENCOUNTER — Ambulatory Visit: Payer: Medicare Other | Admitting: Internal Medicine

## 2019-11-09 DIAGNOSIS — N2581 Secondary hyperparathyroidism of renal origin: Secondary | ICD-10-CM | POA: Diagnosis not present

## 2019-11-09 DIAGNOSIS — Z992 Dependence on renal dialysis: Secondary | ICD-10-CM | POA: Diagnosis not present

## 2019-11-09 DIAGNOSIS — D631 Anemia in chronic kidney disease: Secondary | ICD-10-CM | POA: Diagnosis not present

## 2019-11-09 DIAGNOSIS — N186 End stage renal disease: Secondary | ICD-10-CM | POA: Diagnosis not present

## 2019-11-12 DIAGNOSIS — D631 Anemia in chronic kidney disease: Secondary | ICD-10-CM | POA: Diagnosis not present

## 2019-11-12 DIAGNOSIS — N186 End stage renal disease: Secondary | ICD-10-CM | POA: Diagnosis not present

## 2019-11-12 DIAGNOSIS — Z992 Dependence on renal dialysis: Secondary | ICD-10-CM | POA: Diagnosis not present

## 2019-11-12 DIAGNOSIS — N2581 Secondary hyperparathyroidism of renal origin: Secondary | ICD-10-CM | POA: Diagnosis not present

## 2019-11-13 ENCOUNTER — Encounter: Payer: Self-pay | Admitting: Nurse Practitioner

## 2019-11-14 DIAGNOSIS — D631 Anemia in chronic kidney disease: Secondary | ICD-10-CM | POA: Diagnosis not present

## 2019-11-14 DIAGNOSIS — N2581 Secondary hyperparathyroidism of renal origin: Secondary | ICD-10-CM | POA: Diagnosis not present

## 2019-11-14 DIAGNOSIS — D689 Coagulation defect, unspecified: Secondary | ICD-10-CM | POA: Diagnosis not present

## 2019-11-14 DIAGNOSIS — Z992 Dependence on renal dialysis: Secondary | ICD-10-CM | POA: Diagnosis not present

## 2019-11-14 DIAGNOSIS — N186 End stage renal disease: Secondary | ICD-10-CM | POA: Diagnosis not present

## 2019-11-15 ENCOUNTER — Encounter: Payer: Self-pay | Admitting: Nurse Practitioner

## 2019-11-15 ENCOUNTER — Other Ambulatory Visit: Payer: Self-pay

## 2019-11-15 ENCOUNTER — Ambulatory Visit (INDEPENDENT_AMBULATORY_CARE_PROVIDER_SITE_OTHER): Payer: Medicare Other | Admitting: Nurse Practitioner

## 2019-11-15 ENCOUNTER — Ambulatory Visit (INDEPENDENT_AMBULATORY_CARE_PROVIDER_SITE_OTHER): Payer: Medicare Other

## 2019-11-15 ENCOUNTER — Ambulatory Visit: Payer: Medicare Other | Admitting: Internal Medicine

## 2019-11-15 VITALS — BP 130/78 | HR 85 | Temp 97.5°F | Ht 67.8 in | Wt 256.0 lb

## 2019-11-15 VITALS — BP 130/78 | HR 85 | Temp 97.5°F | Ht 67.8 in | Wt 256.4 lb

## 2019-11-15 DIAGNOSIS — Z0000 Encounter for general adult medical examination without abnormal findings: Secondary | ICD-10-CM

## 2019-11-15 DIAGNOSIS — M79641 Pain in right hand: Secondary | ICD-10-CM

## 2019-11-15 DIAGNOSIS — R05 Cough: Secondary | ICD-10-CM

## 2019-11-15 DIAGNOSIS — E1121 Type 2 diabetes mellitus with diabetic nephropathy: Secondary | ICD-10-CM | POA: Diagnosis not present

## 2019-11-15 DIAGNOSIS — E782 Mixed hyperlipidemia: Secondary | ICD-10-CM | POA: Diagnosis not present

## 2019-11-15 DIAGNOSIS — I10 Essential (primary) hypertension: Secondary | ICD-10-CM

## 2019-11-15 DIAGNOSIS — R053 Chronic cough: Secondary | ICD-10-CM

## 2019-11-15 LAB — POCT INR: INR: 5.5 — AB (ref <=1.1)

## 2019-11-15 MED ORDER — IPRATROPIUM BROMIDE 0.06 % NA SOLN: 2.0000 | Freq: Four times a day (QID) | NASAL | 12 refills | Status: DC

## 2019-11-15 MED ORDER — DICLOFENAC SODIUM 1 % EX GEL: 2.0000 g | Freq: Four times a day (QID) | CUTANEOUS | 2 refills | Status: DC

## 2019-11-15 NOTE — Progress Notes (Signed)
This visit occurred during the SARS-CoV-2 public health emergency.  Safety protocols were in place, including screening questions prior to the visit, additional usage of staff PPE, and extensive cleaning of exam room while observing appropriate contact time as indicated for disinfecting solutions.  Subjective:     Patient ID: [name] , male    DOB: [birth year] , 59 y.o.   MRN: [id number]   Chief Complaint  Patient presents with  . Cough  . Hand Pain    patient stated his right hand has been throbbing on and off    HPI  Cough This is a recurrent problem. The current episode started more than 1 month ago. The problem has been gradually worsening. The problem occurs every few hours. The cough is non-productive. Pertinent negatives include no chest pain, chills, fever or nasal congestion. Exacerbated by: change in air temperature.  He has tried oral steroids and a beta-agonist inhaler for the symptoms. The treatment provided mild relief. There is no history of asthma or bronchitis.  Hand Pain  The incident occurred more than 1 week ago (couple of months). There was no injury mechanism. The pain is present in the right hand. Quality: sharp  Pertinent negatives include no chest pain. Exacerbated by: worse at night. Treatments tried: Vitamin B12  The treatment provided moderate relief.     Past Medical History:  Diagnosis Date  . Acute lower GI bleeding 06/21/2018  . Acute respiratory failure with hypoxia ([place]) 01/13/2015  . Atrial flutter ([place])   . Bile leak, postoperative 03/15/2016  . Biliary dyskinesia 02/12/2016  . 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 Cardiomyopathy   . CHF (congestive heart failure) ([place])   . Coronary artery disease    normal coronaries by 10/10/08 cath, Cardiac Cath 08-04-12 epic.Dr. Doylene Canard follows  . Diabetes mellitus    pt. states he's borderline diabetic., no longer taking med,- off med. since 2013  . Dialysis patient [facility])     Mon-Wed-Fri([place])- Left AV fistula  . Diverticulitis November 2016   reoccurred in December 2016  . Diverticulitis of intestine without perforation or abscess without bleeding 04/21/2015  . DVT (deep venous thrombosis) ([place])   . ESRD (end stage renal disease) ([place])   . GERD (gastroesophageal reflux disease)   . Gout   . Hemorrhage of left kidney 01/13/2018  . History of nephrectomy 07/04/2012   History of right nephrectomy in 2002 for renal cell carcinoma   . History of unilateral nephrectomy   . Hypertension   . Low iron   . Myocardial infarction [facility]) ?2006  . Renal cell carcinoma    dialysis- [facility]- [address]- Dr. Mercy  follows.  . Renal insufficiency   . Shortness of breath 05/19/11   "at rest, lying down, w/exertion"  . Stroke [facility]) 02/2011   05/19/11 denies residual  . Umbilical hernia 29/92/42   unrepaired  . Wears glasses      Family History  Problem Relation Age of Onset  . Hypertension Mother   . Kidney disease Mother      Current Outpatient Medications:  .  albuterol (VENTOLIN HFA) 108 (90 Base) MCG/ACT inhaler, Inhale 2 puffs into the lungs every 6 (six) hours as needed for wheezing or shortness of breath., Disp: 18 g, Rfl: 2 .  allopurinol (ZYLOPRIM) 100 MG tablet, Take 1 tablet (100 mg total) by mouth daily., Disp: 30 tablet, Rfl: 3 .  amiodarone (PACERONE) 200 MG tablet, Take  200 mg by mouth daily. , Disp: , Rfl:  .  calcium acetate (PHOSLO) 667 MG capsule, Take 1-3 capsules (667-2,001 mg total) by mouth See admin instructions. Take 2001 mg capsules with meals three times daily and 667 mg capsule with snacks. (Patient taking differently: Take 2,001 mg by mouth daily. ), Disp:  , Rfl:  .  chlorhexidine (PERIDEX) 0.12 % solution, 15 mLs by Mouth Rinse route in the morning and at bedtime. , Disp: , Rfl:  .  cinacalcet (SENSIPAR) 30 MG tablet, Take 30 mg by mouth daily. , Disp: , Rfl:  .  ciprofloxacin (CIPRO) 500 MG tablet, Take 500 mg by  mouth 2 (two) times daily., Disp: , Rfl:  .  ferric citrate (AURYXIA) 1 GM 210 MG(Fe) tablet, Take 210 mg by mouth 3 (three) times daily with meals. , Disp: , Rfl:  .  fexofenadine-pseudoephedrine (ALLEGRA-D) 60-120 MG 12 hr tablet, Take 1 tablet by mouth every 12 (twelve) hours., Disp: 30 tablet, Rfl: 0 .  Lidocaine, Anorectal, (L-M-X 5) 5 % CREA, Apply 1 application topically 4 (four) times daily as needed (apply to area up to 6 times daily as needed). (Patient taking differently: Apply 1 application topically See admin instructions. Apply up to six times daily.), Disp: 1 Tube, Rfl: 0 .  lidocaine-prilocaine (EMLA) cream, Apply 1 application topically See admin instructions. Apply topically to port access prior to dialysis - Monday, Wednesday, Friday., Disp: , Rfl: 12 .  midodrine (PROAMATINE) 10 MG tablet, Take 10 mg by mouth 3 (three) times daily., Disp: , Rfl:  .  multivitamin (RENA-VIT) TABS tablet, Take 1 tablet by mouth at bedtime., Disp: , Rfl:  .  nitroGLYCERIN (NITROSTAT) 0.4 MG SL tablet, Place 0.4 mg under the tongue every 5 (five) minutes as needed for chest pain. , Disp: , Rfl: 0 .  Nutritional Supplements (FEEDING SUPPLEMENT, NEPRO CARB STEADY,) LIQD, Take 237 mLs by mouth daily. , Disp: , Rfl:  .  ondansetron (ZOFRAN ODT) 4 MG disintegrating tablet, Take 1 tablet (4 mg total) by mouth every 8 (eight) hours as needed for nausea or vomiting., Disp: 20 tablet, Rfl: 0 .  pantoprazole (PROTONIX) 40 MG tablet, Take 40 mg by mouth 2 (two) times daily., Disp: , Rfl:  .  sucroferric oxyhydroxide (VELPHORO) 500 MG chewable tablet, Chew 500 mg by mouth daily. , Disp: , Rfl:  .  UNABLE TO FIND, Out patient physical therapy- vestibular therapy for BPPV, Disp: 1 each, Rfl: 0 .  UNABLE TO FIND, Med Name: ulceraflora as needed, Disp: , Rfl:  .  warfarin (COUMADIN) 2.5 MG tablet, Take 3 tablets (7.5 mg total) by mouth daily at 6 PM. (Patient taking differently: Take 2.5 mg by mouth daily. ), Disp: ,  Rfl:  .  HYDROcodone-homatropine (HYDROMET) 5-1.5 MG/5ML syrup, Take 5 mLs by mouth 2 (two) times daily as needed. (Patient not taking: Reported on 11/14/2019), Disp: 120 mL, Rfl: 0 .  predniSONE (STERAPRED UNI-PAK 21 TAB) 10 MG (21) TBPK tablet, Take as directed (Patient not taking: Reported on 11/14/2019), Disp: 21 tablet, Rfl: 0   Allergies  Allergen Reactions  . Ace Inhibitors Itching, Cough and Other (See Comments)     Review of Systems  Constitutional: Negative for chills and fever.  Respiratory: Positive for cough.   Cardiovascular: Negative for chest pain.     Today's Vitals   11/15/19 0845  BP: 130/78  Pulse: 85  Temp: (!) 97.5 F (36.4 C)  TempSrc: Oral  Weight: 256 lb  6.4 oz (116.3 kg)  Height: 5' 7.8" (1.722 m)  PainSc: 0-No pain   Body mass index is 39.22 kg/m.   Objective:  Physical Exam Vitals reviewed.  Constitutional:      General: He is not in acute distress.    Appearance: Normal appearance. He is obese.  Eyes:     Pupils: Pupils are equal, round, and reactive to light.  Cardiovascular:     Rate and Rhythm: Normal rate and regular rhythm.     Pulses: Normal pulses.     Heart sounds: Normal heart sounds. No murmur heard.   Pulmonary:     Effort: Pulmonary effort is normal. No respiratory distress.     Breath sounds: Normal breath sounds.  Musculoskeletal:        General: No swelling, tenderness or deformity. Normal range of motion.  Neurological:     General: No focal deficit present.     Mental Status: He is alert and oriented to person, place, and time.     Cranial Nerves: No cranial nerve deficit.  Psychiatric:        Mood and Affect: Mood normal.        Behavior: Behavior normal.        Thought Content: Thought content normal.        Judgment: Judgment normal.         Assessment And Plan:     1. Persistent cough  He has had a persistent cough, CXR negative for infection or acute issues, has also been treated with antibiotics and  steroids with temporary relief.  Sounds like this could be related to allergic cough related to environmental allergens  Will try the ipratropium nasal spray to see if is more effective. - ipratropium (ATROVENT) 0.06 % nasal spray; Place 2 sprays into both nostrils 4 (four) times daily.  Dispense: 15 mL; Refill: 12  2. Right hand pain  No abnormal swelling noted to hand  Will treat with diclofenac - diclofenac Sodium (VOLTAREN) 1 % GEL; Apply 2 g topically 4 (four) times daily.  Dispense: 100 g; Refill: 2  3. Hypertension, benign  Chronic, fair controlled  Continue with current medications  Continue with low salt diet  4. Controlled type 2 diabetes mellitus with diabetic nephropathy, without long-term current use of insulin (HCC)  Chronic, stable.   No current medications, diet controlled - CMP14+EGFR - Hemoglobin A1c  5. Mixed hyperlipidemia  Chronic, stable  Diet controlled  - Lipid panel - CMP14+EGFR   [name], FNP    THE PATIENT IS ENCOURAGED TO PRACTICE SOCIAL DISTANCING DUE TO THE COVID-19 PANDEMIC.

## 2019-11-15 NOTE — Progress Notes (Signed)
This visit occurred during the SARS-CoV-2 public health emergency.  Safety protocols were in place, including screening questions prior to the visit, additional usage of staff PPE, and extensive cleaning of exam room while observing appropriate contact time as indicated for disinfecting solutions.  Subjective:   [name] is a 59 y.o. male who presents for Medicare Annual/Subsequent preventive examination.  Review of Systems    n/a Cardiac Risk Factors include: advanced age (>52men, >8 women);diabetes mellitus;male gender;sedentary lifestyle     Objective:    Today's Vitals   11/15/19 0857 11/15/19 0910  BP: 130/78   Pulse: 85   Temp: (!) 97.5 F (36.4 C)   TempSrc: Oral   Weight: 256 lb (116.1 kg)   Height: 5' 7.8" (1.722 m)   PainSc:  10-Worst pain ever   Body mass index is 39.15 kg/m.  Advanced Directives 11/15/2019 03/15/2019 01/18/2019 11/16/2018 11/09/2018 08/16/2018 08/10/2018  Does Patient Have a Medical Advance Directive? No No No No No No No  Would patient like information on creating a medical advance directive? No - Patient declined No - Patient declined No - Patient declined No - Patient declined No - Patient declined No - Patient declined No - Patient declined  Pre-existing out of facility DNR order (yellow form or pink MOST form) - - - - - - -    Current Medications (verified) Outpatient Encounter Medications as of 11/15/2019  Medication Sig  . albuterol (VENTOLIN HFA) 108 (90 Base) MCG/ACT inhaler Inhale 2 puffs into the lungs every 6 (six) hours as needed for wheezing or shortness of breath.  . allopurinol (ZYLOPRIM) 100 MG tablet Take 1 tablet (100 mg total) by mouth daily.  Marland Kitchen amiodarone (PACERONE) 200 MG tablet Take 200 mg by mouth daily.   . calcium acetate (PHOSLO) 667 MG capsule Take 1-3 capsules (667-2,001 mg total) by mouth See admin instructions. Take 2001 mg capsules with meals three times daily and 667 mg capsule with snacks. (Patient taking  differently: Take 2,001 mg by mouth daily. )  . chlorhexidine (PERIDEX) 0.12 % solution 15 mLs by Mouth Rinse route in the morning and at bedtime.   . cinacalcet (SENSIPAR) 30 MG tablet Take 30 mg by mouth daily.   . ferric citrate (AURYXIA) 1 GM 210 MG(Fe) tablet Take 210 mg by mouth 3 (three) times daily with meals.   . fexofenadine-pseudoephedrine (ALLEGRA-D) 60-120 MG 12 hr tablet Take 1 tablet by mouth every 12 (twelve) hours.  Marland Kitchen HYDROcodone-homatropine (HYDROMET) 5-1.5 MG/5ML syrup Take 5 mLs by mouth 2 (two) times daily as needed. (Patient not taking: Reported on 11/14/2019)  . Lidocaine, Anorectal, (L-M-X 5) 5 % CREA Apply 1 application topically 4 (four) times daily as needed (apply to area up to 6 times daily as needed). (Patient taking differently: Apply 1 application topically See admin instructions. Apply up to six times daily.)  . lidocaine-prilocaine (EMLA) cream Apply 1 application topically See admin instructions. Apply topically to port access prior to dialysis - Monday, Wednesday, Friday.  . midodrine (PROAMATINE) 10 MG tablet Take 10 mg by mouth 3 (three) times daily.  . multivitamin (RENA-VIT) TABS tablet Take 1 tablet by mouth at bedtime.  . nitroGLYCERIN (NITROSTAT) 0.4 MG SL tablet Place 0.4 mg under the tongue every 5 (five) minutes as needed for chest pain.   . Nutritional Supplements (FEEDING SUPPLEMENT, NEPRO CARB STEADY,) LIQD Take 237 mLs by mouth daily.   . ondansetron (ZOFRAN ODT) 4 MG disintegrating tablet Take 1 tablet (4 mg total)  by mouth every 8 (eight) hours as needed for nausea or vomiting.  . pantoprazole (PROTONIX) 40 MG tablet Take 40 mg by mouth 2 (two) times daily.  . sucroferric oxyhydroxide (VELPHORO) 500 MG chewable tablet Chew 500 mg by mouth daily.   Marland Kitchen UNABLE TO FIND Out patient physical therapy- vestibular therapy for BPPV  . UNABLE TO FIND Med Name: Lahoma Crocker as needed  . warfarin (COUMADIN) 2.5 MG tablet Take 3 tablets (7.5 mg total) by mouth  daily at 6 PM. (Patient taking differently: Take 2.5 mg by mouth daily. )  . [DISCONTINUED] ciprofloxacin (CIPRO) 500 MG tablet Take 500 mg by mouth 2 (two) times daily.   No facility-administered encounter medications on file as of 11/15/2019.    Allergies (verified) Ace inhibitors   History: Past Medical History:  Diagnosis Date  . Acute lower GI bleeding 06/21/2018  . Acute respiratory failure with hypoxia ([place]) 01/13/2015  . Atrial flutter ([place])   . Bile leak, postoperative 03/15/2016  . Biliary dyskinesia 02/12/2016  . 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 Cardiomyopathy   . CHF (congestive heart failure) ([place])   . Coronary artery disease    normal coronaries by 10/10/08 cath, Cardiac Cath 08-04-12 epic.Dr. Doylene Canard follows  . Diabetes mellitus    pt. states he's borderline diabetic., no longer taking med,- off med. since 2013  . Dialysis patient [facility])    Mon-Wed-Fri([place])- Left AV fistula  . Diverticulitis November 2016   reoccurred in December 2016  . Diverticulitis of intestine without perforation or abscess without bleeding 04/21/2015  . DVT (deep venous thrombosis) ([place])   . ESRD (end stage renal disease) ([place])   . GERD (gastroesophageal reflux disease)   . Gout   . Hemorrhage of left kidney 01/13/2018  . History of nephrectomy 07/04/2012   History of right nephrectomy in 2002 for renal cell carcinoma   . History of unilateral nephrectomy   . Hypertension   . Low iron   . Myocardial infarction [facility]) ?2006  . Renal cell carcinoma    dialysis- MWF- [address]- Dr. Mercy Moore follows.  . Renal insufficiency   . Shortness of breath 05/19/11   "at rest, lying down, w/exertion"  . Stroke [facility]) 02/2011   05/19/11 denies residual  . Umbilical hernia 14/48/18   unrepaired  . Wears glasses    Past Surgical History:  Procedure Laterality Date  . ANAL FISTULOTOMY  08/15/2018  . AV FISTULA PLACEMENT  03/29/2011   Procedure:  ARTERIOVENOUS (AV) FISTULA CREATION;  Surgeon: Hinda Lenis, MD;  Location: [place];  Service: Vascular;  Laterality: Left;  LEFT RADIOCEPHALIC , Arteriovenous [id number])  . CARDIAC CATHETERIZATION  05/21/11  . CARDIAC CATHETERIZATION N/A 03/16/2016   Procedure: Left Heart Cath and Coronary Angiography;  Surgeon: Dixie Dials, MD;  Location: [place] CV LAB;  Service: Cardiovascular;  Laterality: N/A;  . CHOLECYSTECTOMY N/A 02/12/2016   Procedure: LAPAROSCOPIC CHOLECYSTECTOMY WITH INTRAOPERATIVE CHOLANGIOGRAM;  Surgeon: Jackolyn Confer, MD;  Location: [place];  Service: General;  Laterality: N/A;  . COLONOSCOPY WITH PROPOFOL N/A 02/01/2017   Procedure: COLONOSCOPY WITH PROPOFOL;  Surgeon: Juanita Craver, MD;  Location: [facility];  Service: Endoscopy;  Laterality: N/A;  . dialysis cath placed    . ESOPHAGOGASTRODUODENOSCOPY (EGD) WITH PROPOFOL N/A 12/02/2015   Procedure: ESOPHAGOGASTRODUODENOSCOPY (EGD) WITH PROPOFOL;  Surgeon: Juanita Craver, MD;  Location: [facility];  Service: Endoscopy;  Laterality: N/A;  . EVALUATION UNDER ANESTHESIA WITH HEMORRHOIDECTOMY N/A  08/15/2018   Procedure: HEMORRHOID LIGATION/PEXY;  Surgeon: Michael Boston, MD;  Location: [place];  Service: General;  Laterality: N/A;  . FINGER SURGERY     L pinkie finger- ORIF- /w remaining hardware - 1990's    . FISTULOTOMY N/A 08/15/2018   Procedure: SUPERFICIAL ANAL FISTULOTOMY;  Surgeon: Michael Boston, MD;  Location: [place];  Service: General;  Laterality: N/A;  . HEMORRHOID SURGERY    . HERNIA REPAIR N/A [id number]   Umbilical hernia repair  . INSERTION OF DIALYSIS CATHETER N/A 01/27/2016   Procedure: INSERTION OF DIALYSIS CATHETER;  Surgeon: Waynetta Sandy, MD;  Location: [place];  Service: Vascular;  Laterality: N/A;  . INSERTION OF MESH N/A 07/04/2012   Procedure: INSERTION OF MESH;  Surgeon: Madilyn Hook, DO;  Location: [place];  Service: General;  Laterality: N/A;  . IR EMBO ART  VEN HEMORR LYMPH EXTRAV  INC GUIDE  ROADMAPPING  01/13/2018  . IR FLUORO GUIDE CV LINE RIGHT  01/13/2018  . IR IVC FILTER PLMT / S&I /IMG GUID/MOD SED  01/27/2018  . IR RENAL SELECTIVE  UNI INC S&I MOD SED  01/13/2018  . IR US GUIDE VASC ACCESS RIGHT  01/13/2018  . KIDNEY CYST REMOVAL  2019  . LAPAROSCOPIC LYSIS OF ADHESIONS N/A 02/12/2016   Procedure: LAPAROSCOPIC LYSIS OF ADHESIONS;  Surgeon: Jackolyn Confer, MD;  Location: [place];  Service: General;  Laterality: N/A;  . LEFT AND RIGHT HEART CATHETERIZATION WITH CORONARY ANGIOGRAM N/A 08/04/2012   Procedure: LEFT AND RIGHT HEART CATHETERIZATION WITH CORONARY ANGIOGRAM;  Surgeon: Birdie Riddle, MD;  Location: [place] CATH LAB;  Service: Cardiovascular;  Laterality: N/A;  . NEPHRECTOMY  2000   right  . REVISON OF ARTERIOVENOUS FISTULA Left 06/05/2013   Procedure: REVISON OF LEFT RADIOCEPHALIC  ARTERIOVENOUS FISTULA;  Surgeon: Conrad [place], MD;  Location: [place];  Service: Vascular;  Laterality: Left;  . REVISON OF ARTERIOVENOUS FISTULA Left 01/27/2016   Procedure: REVISION OF LEFT UPPER EXTREMITY ARTERIOVENOUS FISTULA;  Surgeon: Waynetta Sandy, MD;  Location: [place];  Service: Vascular;  Laterality: Left;  . REVISON OF ARTERIOVENOUS FISTULA Left 08/03/2016   Procedure: REVISON OF Left arm ARTERIOVENOUS FISTULA;  Surgeon: Elam Dutch, MD;  Location: [place];  Service: Vascular;  Laterality: Left;  . REVISON OF ARTERIOVENOUS FISTULA Left 05/06/2017   Procedure: REVISION PLICATION OF ARTERIOVENOUS FISTULA LEFT ARM;  Surgeon: Rosetta Posner, MD;  Location: [place];  Service: Vascular;  Laterality: Left;  . REVISON OF ARTERIOVENOUS FISTULA Left 02/01/2019   Procedure: REVISION OF ARTERIOVENOUS FISTULA LEFT ARM;  Surgeon: [name], MD;  Location: [place];  Service: Vascular;  Laterality: Left;  . REVISON OF ARTERIOVENOUS FISTULA Left 03/15/2019   Procedure: REVISION PLICATION OF ARTERIOVENOUS FISTULA LEFT ARM;  Surgeon: [name], MD;  Location: [place];  Service: Vascular;   Laterality: Left;  . RIGHT HEART CATHETERIZATION N/A 05/21/2011   Procedure: RIGHT HEART CATH;  Surgeon: Birdie Riddle, MD;  Location: [facility] CATH LAB;  Service: Cardiovascular;  Laterality: N/A;  . smashed  1990's   "left pinky; have a plate in there"  . UMBILICAL HERNIA REPAIR N/A 07/04/2012   Procedure: LAPAROSCOPIC UMBILICAL HERNIA;  Surgeon: Madilyn Hook, DO;  Location: [place];  Service: General;  Laterality: N/A;  . US ECHOCARDIOGRAPHY  05/20/11   Family History  Problem Relation Age of Onset  . Hypertension Mother   . Kidney disease Mother    Social History   Socioeconomic History  .  Marital status: Married    Spouse name: Not on file  . Number of children: Not on file  . Years of education: Not on file  . Highest education level: Not on file  Occupational History  . Occupation: disability  Tobacco Use  . Smoking status: Never Smoker  . Smokeless tobacco: Never Used  Vaping Use  . Vaping Use: Never used  Substance and Sexual Activity  . Alcohol use: No    Alcohol/week: 0.0 standard drinks  . Drug use: No    Types: Cocaine    Comment: Past hx-none in 20 yrs  . Sexual activity: Yes  Other Topics Concern  . Not on file  Social History Narrative  . Not on file   Social Determinants of Health   Financial Resource Strain: Low Risk   . Difficulty of Paying Living Expenses: Not hard at all  Food Insecurity: No Food Insecurity  . Worried About Charity fundraiser in the Last Year: Never true  . Ran Out of Food in the Last Year: Never true  Transportation Needs: No Transportation Needs  . Lack of Transportation (Medical): No  . Lack of Transportation (Non-Medical): No  Physical Activity: Insufficiently Active  . Days of Exercise per Week: 2 days  . Minutes of Exercise per Session: 60 min  Stress: No Stress Concern Present  . Feeling of Stress : Not at all  Social Connections:   . Frequency of Communication with Friends and Family:   . Frequency of Social Gatherings  with Friends and Family:   . Attends Religious Services:   . Active Member of Clubs or Organizations:   . Attends Archivist Meetings:   Marland Kitchen Marital Status:     Tobacco Counseling Counseling given: Not Answered   Clinical Intake:  Pre-visit preparation completed: Yes  Pain : 0-10 Pain Score: 10-Worst pain ever Pain Type: Chronic pain Pain Location: Knee Pain Orientation: Right Pain Radiating Towards: none Pain Descriptors / Indicators: Aching, Burning Pain Onset: In the past 7 days (believes it's gout) Pain Frequency: Intermittent     Nutritional Status: BMI > 30  Obese Nutritional Risks: Nausea/ vomitting/ diarrhea (little vomitting with cough, loose stools normally) Diabetes: Yes CBG done?: No Did pt. bring in CBG monitor from home?: No  How often do you need to have someone help you when you read instructions, pamphlets, or other written materials from your doctor or pharmacy?: 1 - Never What is the last grade level you completed in school?: college  Diabetic? Yes Nutrition Risk Assessment:  Has the patient had any N/V/D within the last 2 months?  Yes  Does the patient have any non-healing wounds?  No  Has the patient had any unintentional weight loss or weight gain?  No   Diabetes:  Is the patient diabetic?  Yes  If diabetic, was a CBG obtained today?  No  Did the patient bring in their glucometer from home?  No  How often do you monitor your CBG's? Every 2-3 days.   Financial Strains and Diabetes Management:  Are you having any financial strains with the device, your supplies or your medication? No .  Does the patient want to be seen by Chronic Care Management for management of their diabetes?  No  Would the patient like to be referred to a Nutritionist or for Diabetic Management?  No   Diabetic Exams:  Diabetic Eye Exam: Completed 06/2019 Diabetic Foot Exam: Overdue, Pt has been advised about the importance in completing  this exam. Pt is  scheduled for diabetic foot exam on next dialysis day.   Interpreter Needed?: No  Information entered by :: NAllen LPN   Activities of Daily Living In your present state of health, do you have any difficulty performing the following activities: 11/15/2019 03/15/2019  Hearing? N N  Vision? Y Y  Comment blind in right eye patient is legally blind in right eye  Difficulty concentrating or making decisions? N N  Walking or climbing stairs? N Y  Comment - "sometimes"  Dressing or bathing? N -  Doing errands, shopping? N -  Preparing Food and eating ? N -  Using the Toilet? N -  In the past six months, have you accidently leaked urine? N -  Do you have problems with loss of bowel control? N -  Managing your Medications? N -  Managing your Finances? N -  Some recent data might be hidden    Patient Care Team: [name], FNP as PCP - General (General Practice) Michael Boston, MD as Consulting Physician (General Surgery) Juanita Craver, MD as Consulting Physician (Gastroenterology) Dixie Dials, MD as Consulting Physician (Cardiology) Donato Heinz, MD as Consulting Physician (Nephrology)  Indicate any recent Medical Services you may have received from other than [facility] providers in the past year (date may be approximate).     Assessment:   This is a routine wellness examination for [place].  Hearing/Vision screen  Hearing Screening   125Hz  250Hz  500Hz  1000Hz  2000Hz  3000Hz  4000Hz  6000Hz  8000Hz   Right ear:           Left ear:           Vision Screening Comments: Regular eye exams. [facility]  Dietary issues and exercise activities discussed: Current Exercise Habits: Home exercise routine, Type of exercise: walking, Time (Minutes): 60, Frequency (Times/Week): 2, Weekly Exercise (Minutes/Week): 120  Goals    . Patient Stated     11/15/2019, try to do better     . Weight (lb) < 200 lb (90.7 kg)     Wants to lose 20 pounds      Depression Screen PHQ 2/9 Scores  11/15/2019 05/31/2019 01/18/2019 11/09/2018 07/18/2018 07/11/2018 07/05/2018  PHQ - 2 Score 0 0 0 0 0 0 0  PHQ- 9 Score - - - 1 - - -    Fall Risk Fall Risk  11/15/2019 05/31/2019 01/18/2019 11/09/2018 07/18/2018  Falls in the past year? 0 0 0 1 0  Number falls in past yr: - - - 0 -  Comment - - - bp drop and fell -  Injury with Fall? - - - 0 -  Risk for fall due to : Medication side effect - - History of fall(s);Medication side effect -  Follow up Falls evaluation completed;Education provided;Falls prevention discussed - - Education provided;Falls prevention discussed -    Any stairs in or around the home? yes If so, are there any without handrails? yes Home free of loose throw rugs in walkways, pet beds, electrical cords, etc? Yes  Adequate lighting in your home to reduce risk of falls? Yes   ASSISTIVE DEVICES UTILIZED TO PREVENT FALLS:  Life alert? No  Use of a cane, walker or w/c? Yes  Grab bars in the bathroom? No  Shower chair or bench in shower? No  Elevated toilet seat or a handicapped toilet? Yes   TIMED UP AND GO:  Was the test performed? No .     Gait slow and steady with assistive device  Cognitive  Function:     6CIT Screen 11/15/2019 11/09/2018  What Year? 0 points 0 points  What month? 0 points 0 points  What time? 0 points 0 points  Count back from 20 0 points 0 points  Months in reverse 4 points 4 points  Repeat phrase 4 points 0 points  Total Score 8 4    Immunizations Immunization History  Administered Date(s) Administered  . PFIZER SARS-COV-2 Vaccination 08/03/2019, 08/29/2019    TDAP status: Up to date Flu Vaccine status: Declined, Education has been provided regarding the importance of this vaccine but patient still declined. Advised may receive this vaccine at local pharmacy or Health Dept. Aware to provide a copy of the vaccination record if obtained from local pharmacy or Health Dept. Verbalized acceptance and understanding. Pneumococcal vaccine status:  Declined,  Education has been provided regarding the importance of this vaccine but patient still declined. Advised may receive this vaccine at local pharmacy or Health Dept. Aware to provide a copy of the vaccination record if obtained from local pharmacy or Health Dept. Verbalized acceptance and understanding.  Covid-19 vaccine status: Completed vaccines  Qualifies for Shingles Vaccine? Yes   Zostavax completed No   Shingrix Completed?: No.    Education has been provided regarding the importance of this vaccine. Patient has been advised to call insurance company to determine out of pocket expense if they have not yet received this vaccine. Advised may also receive vaccine at local pharmacy or Health Dept. Verbalized acceptance and understanding.  Screening Tests Health Maintenance  Topic Date Due  . PNEUMOCOCCAL POLYSACCHARIDE VACCINE AGE 57-64 HIGH RISK  Never done  . OPHTHALMOLOGY EXAM  Never done  . URINE MICROALBUMIN  Never done  . HEMOGLOBIN A1C  05/11/2019  . FOOT EXAM  11/09/2019  . INFLUENZA VACCINE  12/23/2019  . TETANUS/TDAP  08/11/2026  . COLONOSCOPY  02/02/2027  . COVID-19 Vaccine  Completed  . Hepatitis C Screening  Completed  . HIV Screening  Completed    Health Maintenance  Health Maintenance Due  Topic Date Due  . PNEUMOCOCCAL POLYSACCHARIDE VACCINE AGE 57-64 HIGH RISK  Never done  . OPHTHALMOLOGY EXAM  Never done  . URINE MICROALBUMIN  Never done  . HEMOGLOBIN A1C  05/11/2019  . FOOT EXAM  11/09/2019    Colorectal cancer screening: Completed 02/01/2017. Repeat every 10 years  Lung Cancer Screening: (Low Dose CT Chest recommended if Age 29-80 years, 30 pack-year currently smoking OR have quit w/in 15years.) does not qualify.   Lung Cancer Screening Referral: no  Additional Screening:  Hepatitis C Screening: does qualify; Completed 07/18/2018  Vision Screening: Recommended annual ophthalmology exams for early detection of glaucoma and other disorders of the  eye. Is the patient up to date with their annual eye exam?  Yes  Who is the provider or what is the name of the office in which the patient attends annual eye exams? [facility] If pt is not established with a provider, would they like to be referred to a provider to establish care? No .   Dental Screening: Recommended annual dental exams for proper oral hygiene  Community Resource Referral / Chronic Care Management: CRR required this visit?  No   CCM required this visit?  No      Plan:     I have personally reviewed and noted the following in the patient's chart:   . Medical and social history . Use of alcohol, tobacco or illicit drugs  . Current medications and supplements .  Functional ability and status . Nutritional status . Physical activity . Advanced directives . List of other physicians . Hospitalizations, surgeries, and ER visits in previous 12 months . Vitals . Screenings to include cognitive, depression, and falls . Referrals and appointments  In addition, I have reviewed and discussed with patient certain preventive protocols, quality metrics, and best practice recommendations. A written personalized care plan for preventive services as well as general preventive health recommendations were provided to patient.     Kellie Simmering, LPN   07/23/5994   Nurse Notes: Patient wants to do better by exercising more and doing better with his diet.

## 2019-11-15 NOTE — Patient Instructions (Signed)
[name] , Thank you for taking time to come for your Medicare Wellness Visit. I appreciate your ongoing commitment to your health goals. Please review the following plan we discussed and let me know if I can assist you in the future.   Screening recommendations/referrals: Colonoscopy: completed 02/01/2017. Due 02/02/2027 Recommended yearly ophthalmology/optometry visit for glaucoma screening and checkup Recommended yearly dental visit for hygiene and checkup  Vaccinations: Influenza vaccine: decline Pneumococcal vaccine: decline Tdap vaccine: completed 08/10/2016, due 08/11/2026 Shingles vaccine: discussed   Covid-19: 08/29/2019, 08/03/2019  Advanced directives: Advance directive discussed with you today. Even though you declined this today please call our office should you change your mind and we can give you the proper paperwork for you to fill out.   Conditions/risks identified: obesity  Next appointment: Follow up in one year for your annual wellness visit 11/19/2020 at 8:30  Preventive Care 40-64 Years, Male Preventive care refers to lifestyle choices and visits with your health care provider that can promote health and wellness. What does preventive care include?  A yearly physical exam. This is also called an annual well check.  Dental exams once or twice a year.  Routine eye exams. Ask your health care provider how often you should have your eyes checked.  Personal lifestyle choices, including:  Daily care of your teeth and gums.  Regular physical activity.  Eating a healthy diet.  Avoiding tobacco and drug use.  Limiting alcohol use.  Practicing safe sex.  Taking low-dose aspirin every day starting at age 56. What happens during an annual well check? The services and screenings done by your health care provider during your annual well check will depend on your age, overall health, lifestyle risk factors, and family history of disease. Counseling  Your health care  provider may ask you questions about your:  Alcohol use.  Tobacco use.  Drug use.  Emotional well-being.  Home and relationship well-being.  Sexual activity.  Eating habits.  Work and work Statistician. Screening  You may have the following tests or measurements:  Height, weight, and BMI.  Blood pressure.  Lipid and cholesterol levels. These may be checked every 5 years, or more frequently if you are over 95 years old.  Skin check.  Lung cancer screening. You may have this screening every year starting at age 31 if you have a 30-pack-year history of smoking and currently smoke or have quit within the past 15 years.  Fecal occult blood test (FOBT) of the stool. You may have this test every year starting at age 20.  Flexible sigmoidoscopy or colonoscopy. You may have a sigmoidoscopy every 5 years or a colonoscopy every 10 years starting at age 44.  Prostate cancer screening. Recommendations will vary depending on your family history and other risks.  Hepatitis C blood test.  Hepatitis B blood test.  Sexually transmitted disease (STD) testing.  Diabetes screening. This is done by checking your blood sugar (glucose) after you have not eaten for a while (fasting). You may have this done every 1-3 years. Discuss your test results, treatment options, and if necessary, the need for more tests with your health care provider. Vaccines  Your health care provider may recommend certain vaccines, such as:  Influenza vaccine. This is recommended every year.  Tetanus, diphtheria, and acellular pertussis (Tdap, Td) vaccine. You may need a Td booster every 10 years.  Zoster vaccine. You may need this after age 11.  Pneumococcal 13-valent conjugate (PCV13) vaccine. You may need this if you  have certain conditions and have not been vaccinated.  Pneumococcal polysaccharide (PPSV23) vaccine. You may need one or two doses if you smoke cigarettes or if you have certain conditions. Talk  to your health care provider about which screenings and vaccines you need and how often you need them. This information is not intended to replace advice given to you by your health care provider. Make sure you discuss any questions you have with your health care provider. Document Released: 06/06/2015 Document Revised: 01/28/2016 Document Reviewed: 03/11/2015 Elsevier Interactive Patient Education  2017 [employer] Prevention in the Home Falls can cause injuries. They can happen to people of all ages. There are many things you can do to make your home safe and to help prevent falls. What can I do on the outside of my home?  Regularly fix the edges of walkways and driveways and fix any cracks.  Remove anything that might make you trip as you walk through a door, such as a raised step or threshold.  Trim any bushes or trees on the path to your home.  Use bright outdoor lighting.  Clear any walking paths of anything that might make someone trip, such as rocks or tools.  Regularly check to see if handrails are loose or broken. Make sure that both sides of any steps have handrails.  Any raised decks and porches should have guardrails on the edges.  Have any leaves, snow, or ice cleared regularly.  Use sand or salt on walking paths during winter.  Clean up any spills in your garage right away. This includes oil or grease spills. What can I do in the bathroom?  Use night lights.  Install grab bars by the toilet and in the tub and shower. Do not use towel bars as grab bars.  Use non-skid mats or decals in the tub or shower.  If you need to sit down in the shower, use a plastic, non-slip stool.  Keep the floor dry. Clean up any water that spills on the floor as soon as it happens.  Remove soap buildup in the tub or shower regularly.  Attach bath mats securely with double-sided non-slip rug tape.  Do not have throw rugs and other things on the floor that can make you  trip. What can I do in the bedroom?  Use night lights.  Make sure that you have a light by your bed that is easy to reach.  Do not use any sheets or blankets that are too big for your bed. They should not hang down onto the floor.  Have a firm chair that has side arms. You can use this for support while you get dressed.  Do not have throw rugs and other things on the floor that can make you trip. What can I do in the kitchen?  Clean up any spills right away.  Avoid walking on wet floors.  Keep items that you use a lot in easy-to-reach places.  If you need to reach something above you, use a strong step stool that has a grab bar.  Keep electrical cords out of the way.  Do not use floor polish or wax that makes floors slippery. If you must use wax, use non-skid floor wax.  Do not have throw rugs and other things on the floor that can make you trip. What can I do with my stairs?  Do not leave any items on the stairs.  Make sure that there are handrails on both sides  of the stairs and use them. Fix handrails that are broken or loose. Make sure that handrails are as long as the stairways.  Check any carpeting to make sure that it is firmly attached to the stairs. Fix any carpet that is loose or worn.  Avoid having throw rugs at the top or bottom of the stairs. If you do have throw rugs, attach them to the floor with carpet tape.  Make sure that you have a light switch at the top of the stairs and the bottom of the stairs. If you do not have them, ask someone to add them for you. What else can I do to help prevent falls?  Wear shoes that:  Do not have high heels.  Have rubber bottoms.  Are comfortable and fit you well.  Are closed at the toe. Do not wear sandals.  If you use a stepladder:  Make sure that it is fully opened. Do not climb a closed stepladder.  Make sure that both sides of the stepladder are locked into place.  Ask someone to hold it for you, if  possible.  Clearly mark and make sure that you can see:  Any grab bars or handrails.  First and last steps.  Where the edge of each step is.  Use tools that help you move around (mobility aids) if they are needed. These include:  Canes.  Walkers.  Scooters.  Crutches.  Turn on the lights when you go into a dark area. Replace any light bulbs as soon as they burn out.  Set up your furniture so you have a clear path. Avoid moving your furniture around.  If any of your floors are uneven, fix them.  If there are any pets around you, be aware of where they are.  Review your medicines with your doctor. Some medicines can make you feel dizzy. This can increase your chance of falling. Ask your doctor what other things that you can do to help prevent falls. This information is not intended to replace advice given to you by your health care provider. Make sure you discuss any questions you have with your health care provider. Document Released: 03/06/2009 Document Revised: 10/16/2015 Document Reviewed: 06/14/2014 Elsevier Interactive Patient Education  2017 [employer].

## 2019-11-16 DIAGNOSIS — Z992 Dependence on renal dialysis: Secondary | ICD-10-CM | POA: Diagnosis not present

## 2019-11-16 DIAGNOSIS — N2581 Secondary hyperparathyroidism of renal origin: Secondary | ICD-10-CM | POA: Diagnosis not present

## 2019-11-16 DIAGNOSIS — D631 Anemia in chronic kidney disease: Secondary | ICD-10-CM | POA: Diagnosis not present

## 2019-11-16 DIAGNOSIS — N186 End stage renal disease: Secondary | ICD-10-CM | POA: Diagnosis not present

## 2019-11-16 LAB — HEMOGLOBIN A1C
Est. average glucose Bld gHb Est-mCnc: 111 mg/dL
Hgb A1c MFr Bld: 5.5 % (ref 4.8–5.6)

## 2019-11-16 LAB — CMP14+EGFR
ALT: 12 IU/L (ref 0–44)
AST: 7 IU/L (ref 0–40)
Albumin/Globulin Ratio: 1.2 (ref 1.2–2.2)
Albumin: 4 g/dL (ref 3.8–4.9)
Alkaline Phosphatase: 135 IU/L — ABNORMAL HIGH (ref 48–121)
BUN/Creatinine Ratio: 3 — ABNORMAL LOW (ref 9–20)
BUN: 33 mg/dL — ABNORMAL HIGH (ref 6–24)
Bilirubin Total: 0.3 mg/dL (ref 0.0–1.2)
CO2: 27 mmol/L (ref 20–29)
Calcium: 9 mg/dL (ref 8.7–10.2)
Chloride: 95 mmol/L — ABNORMAL LOW (ref 96–106)
Creatinine, Ser: 10.06 mg/dL — ABNORMAL HIGH (ref 0.76–1.27)
GFR calc Af Amer: 6 mL/min/{1.73_m2} — ABNORMAL LOW (ref >59)
GFR calc non Af Amer: 5 mL/min/{1.73_m2} — ABNORMAL LOW (ref >59)
Globulin, Total: 3.3 g/dL (ref 1.5–4.5)
Glucose: 82 mg/dL (ref 65–99)
Potassium: 4.9 mmol/L (ref 3.5–5.2)
Sodium: 138 mmol/L (ref 134–144)
Total Protein: 7.3 g/dL (ref 6.0–8.5)

## 2019-11-16 LAB — LIPID PANEL
Chol/HDL Ratio: 3.8 ratio (ref 0.0–5.0)
Cholesterol, Total: 200 mg/dL — ABNORMAL HIGH (ref 100–199)
HDL: 53 mg/dL (ref >39)
LDL Chol Calc (NIH): 112 mg/dL — ABNORMAL HIGH (ref 0–99)
Triglycerides: 199 mg/dL — ABNORMAL HIGH (ref 0–149)
VLDL Cholesterol Cal: 35 mg/dL (ref 5–40)

## 2019-11-17 DIAGNOSIS — K5732 Diverticulitis of large intestine without perforation or abscess without bleeding: Secondary | ICD-10-CM | POA: Diagnosis not present

## 2019-11-17 DIAGNOSIS — R1012 Left upper quadrant pain: Secondary | ICD-10-CM | POA: Diagnosis not present

## 2019-11-18 DIAGNOSIS — K5732 Diverticulitis of large intestine without perforation or abscess without bleeding: Secondary | ICD-10-CM | POA: Diagnosis not present

## 2019-11-19 ENCOUNTER — Encounter: Payer: Self-pay | Admitting: Nurse Practitioner

## 2019-11-19 DIAGNOSIS — Z7901 Long term (current) use of anticoagulants: Secondary | ICD-10-CM | POA: Diagnosis not present

## 2019-11-19 DIAGNOSIS — Z992 Dependence on renal dialysis: Secondary | ICD-10-CM | POA: Diagnosis not present

## 2019-11-19 DIAGNOSIS — N2581 Secondary hyperparathyroidism of renal origin: Secondary | ICD-10-CM | POA: Diagnosis not present

## 2019-11-19 DIAGNOSIS — R791 Abnormal coagulation profile: Secondary | ICD-10-CM | POA: Diagnosis not present

## 2019-11-19 DIAGNOSIS — N186 End stage renal disease: Secondary | ICD-10-CM | POA: Diagnosis not present

## 2019-11-19 DIAGNOSIS — I5022 Chronic systolic (congestive) heart failure: Secondary | ICD-10-CM | POA: Diagnosis not present

## 2019-11-19 DIAGNOSIS — I2510 Atherosclerotic heart disease of native coronary artery without angina pectoris: Secondary | ICD-10-CM | POA: Diagnosis not present

## 2019-11-19 DIAGNOSIS — D631 Anemia in chronic kidney disease: Secondary | ICD-10-CM | POA: Diagnosis not present

## 2019-11-19 LAB — POCT INR
INR: 4.1 — AB (ref <=1.1)
INR: 4.4 — AB (ref 0.9–1.1)

## 2019-11-20 DIAGNOSIS — R1032 Left lower quadrant pain: Secondary | ICD-10-CM | POA: Diagnosis not present

## 2019-11-20 DIAGNOSIS — K219 Gastro-esophageal reflux disease without esophagitis: Secondary | ICD-10-CM | POA: Diagnosis not present

## 2019-11-20 DIAGNOSIS — K5732 Diverticulitis of large intestine without perforation or abscess without bleeding: Secondary | ICD-10-CM | POA: Diagnosis not present

## 2019-11-21 DIAGNOSIS — D689 Coagulation defect, unspecified: Secondary | ICD-10-CM | POA: Diagnosis not present

## 2019-11-21 DIAGNOSIS — Z992 Dependence on renal dialysis: Secondary | ICD-10-CM | POA: Diagnosis not present

## 2019-11-21 DIAGNOSIS — N186 End stage renal disease: Secondary | ICD-10-CM | POA: Diagnosis not present

## 2019-11-21 DIAGNOSIS — N2581 Secondary hyperparathyroidism of renal origin: Secondary | ICD-10-CM | POA: Diagnosis not present

## 2019-11-21 DIAGNOSIS — D631 Anemia in chronic kidney disease: Secondary | ICD-10-CM | POA: Diagnosis not present

## 2019-11-21 LAB — POCT INR: INR: 2.1 — AB (ref <=1.1)

## 2019-11-22 ENCOUNTER — Encounter: Payer: Self-pay | Admitting: Nurse Practitioner

## 2019-11-22 DIAGNOSIS — Z992 Dependence on renal dialysis: Secondary | ICD-10-CM | POA: Diagnosis not present

## 2019-11-22 DIAGNOSIS — I120 Hypertensive chronic kidney disease with stage 5 chronic kidney disease or end stage renal disease: Secondary | ICD-10-CM | POA: Diagnosis not present

## 2019-11-22 DIAGNOSIS — N186 End stage renal disease: Secondary | ICD-10-CM | POA: Diagnosis not present

## 2019-11-23 DIAGNOSIS — D631 Anemia in chronic kidney disease: Secondary | ICD-10-CM | POA: Diagnosis not present

## 2019-11-23 DIAGNOSIS — N2581 Secondary hyperparathyroidism of renal origin: Secondary | ICD-10-CM | POA: Diagnosis not present

## 2019-11-23 DIAGNOSIS — Z992 Dependence on renal dialysis: Secondary | ICD-10-CM | POA: Diagnosis not present

## 2019-11-23 DIAGNOSIS — N186 End stage renal disease: Secondary | ICD-10-CM | POA: Diagnosis not present

## 2019-11-26 DIAGNOSIS — N2581 Secondary hyperparathyroidism of renal origin: Secondary | ICD-10-CM | POA: Diagnosis not present

## 2019-11-26 DIAGNOSIS — D631 Anemia in chronic kidney disease: Secondary | ICD-10-CM | POA: Diagnosis not present

## 2019-11-26 DIAGNOSIS — N186 End stage renal disease: Secondary | ICD-10-CM | POA: Diagnosis not present

## 2019-11-26 DIAGNOSIS — Z992 Dependence on renal dialysis: Secondary | ICD-10-CM | POA: Diagnosis not present

## 2019-11-28 ENCOUNTER — Encounter: Payer: Self-pay | Admitting: Nurse Practitioner

## 2019-11-28 DIAGNOSIS — I4892 Unspecified atrial flutter: Secondary | ICD-10-CM | POA: Diagnosis not present

## 2019-11-28 DIAGNOSIS — E1129 Type 2 diabetes mellitus with other diabetic kidney complication: Secondary | ICD-10-CM | POA: Diagnosis not present

## 2019-11-28 DIAGNOSIS — N2581 Secondary hyperparathyroidism of renal origin: Secondary | ICD-10-CM | POA: Diagnosis not present

## 2019-11-28 DIAGNOSIS — Z992 Dependence on renal dialysis: Secondary | ICD-10-CM | POA: Diagnosis not present

## 2019-11-28 DIAGNOSIS — N186 End stage renal disease: Secondary | ICD-10-CM | POA: Diagnosis not present

## 2019-11-28 DIAGNOSIS — D631 Anemia in chronic kidney disease: Secondary | ICD-10-CM | POA: Diagnosis not present

## 2019-11-28 LAB — POCT INR: INR: 1.3 — AB (ref 0.9–1.1)

## 2019-11-29 ENCOUNTER — Encounter: Payer: Self-pay | Admitting: Nurse Practitioner

## 2019-11-30 DIAGNOSIS — D631 Anemia in chronic kidney disease: Secondary | ICD-10-CM | POA: Diagnosis not present

## 2019-11-30 DIAGNOSIS — N186 End stage renal disease: Secondary | ICD-10-CM | POA: Diagnosis not present

## 2019-11-30 DIAGNOSIS — N2581 Secondary hyperparathyroidism of renal origin: Secondary | ICD-10-CM | POA: Diagnosis not present

## 2019-11-30 DIAGNOSIS — Z992 Dependence on renal dialysis: Secondary | ICD-10-CM | POA: Diagnosis not present

## 2019-12-03 DIAGNOSIS — D631 Anemia in chronic kidney disease: Secondary | ICD-10-CM | POA: Diagnosis not present

## 2019-12-03 DIAGNOSIS — N2581 Secondary hyperparathyroidism of renal origin: Secondary | ICD-10-CM | POA: Diagnosis not present

## 2019-12-03 DIAGNOSIS — N186 End stage renal disease: Secondary | ICD-10-CM | POA: Diagnosis not present

## 2019-12-03 DIAGNOSIS — Z992 Dependence on renal dialysis: Secondary | ICD-10-CM | POA: Diagnosis not present

## 2019-12-05 ENCOUNTER — Encounter: Payer: Self-pay | Admitting: Nurse Practitioner

## 2019-12-05 DIAGNOSIS — D689 Coagulation defect, unspecified: Secondary | ICD-10-CM | POA: Diagnosis not present

## 2019-12-05 DIAGNOSIS — N2581 Secondary hyperparathyroidism of renal origin: Secondary | ICD-10-CM | POA: Diagnosis not present

## 2019-12-05 DIAGNOSIS — D631 Anemia in chronic kidney disease: Secondary | ICD-10-CM | POA: Diagnosis not present

## 2019-12-05 DIAGNOSIS — N186 End stage renal disease: Secondary | ICD-10-CM | POA: Diagnosis not present

## 2019-12-05 DIAGNOSIS — Z992 Dependence on renal dialysis: Secondary | ICD-10-CM | POA: Diagnosis not present

## 2019-12-05 LAB — POCT INR: INR: 2.3 — AB (ref 0.9–1.1)

## 2019-12-06 ENCOUNTER — Encounter: Payer: Self-pay | Admitting: Nurse Practitioner

## 2019-12-07 DIAGNOSIS — N186 End stage renal disease: Secondary | ICD-10-CM | POA: Diagnosis not present

## 2019-12-07 DIAGNOSIS — N2581 Secondary hyperparathyroidism of renal origin: Secondary | ICD-10-CM | POA: Diagnosis not present

## 2019-12-07 DIAGNOSIS — Z992 Dependence on renal dialysis: Secondary | ICD-10-CM | POA: Diagnosis not present

## 2019-12-07 DIAGNOSIS — D631 Anemia in chronic kidney disease: Secondary | ICD-10-CM | POA: Diagnosis not present

## 2019-12-10 DIAGNOSIS — D631 Anemia in chronic kidney disease: Secondary | ICD-10-CM | POA: Diagnosis not present

## 2019-12-10 DIAGNOSIS — Z992 Dependence on renal dialysis: Secondary | ICD-10-CM | POA: Diagnosis not present

## 2019-12-10 DIAGNOSIS — N186 End stage renal disease: Secondary | ICD-10-CM | POA: Diagnosis not present

## 2019-12-10 DIAGNOSIS — N2581 Secondary hyperparathyroidism of renal origin: Secondary | ICD-10-CM | POA: Diagnosis not present

## 2019-12-12 DIAGNOSIS — Z992 Dependence on renal dialysis: Secondary | ICD-10-CM | POA: Diagnosis not present

## 2019-12-12 DIAGNOSIS — D689 Coagulation defect, unspecified: Secondary | ICD-10-CM | POA: Diagnosis not present

## 2019-12-12 DIAGNOSIS — N2581 Secondary hyperparathyroidism of renal origin: Secondary | ICD-10-CM | POA: Diagnosis not present

## 2019-12-12 DIAGNOSIS — D631 Anemia in chronic kidney disease: Secondary | ICD-10-CM | POA: Diagnosis not present

## 2019-12-12 DIAGNOSIS — N186 End stage renal disease: Secondary | ICD-10-CM | POA: Diagnosis not present

## 2019-12-13 LAB — POCT INR: INR: 1.3 — AB (ref <=1.1)

## 2019-12-14 DIAGNOSIS — D631 Anemia in chronic kidney disease: Secondary | ICD-10-CM | POA: Diagnosis not present

## 2019-12-14 DIAGNOSIS — N2581 Secondary hyperparathyroidism of renal origin: Secondary | ICD-10-CM | POA: Diagnosis not present

## 2019-12-14 DIAGNOSIS — N186 End stage renal disease: Secondary | ICD-10-CM | POA: Diagnosis not present

## 2019-12-14 DIAGNOSIS — Z992 Dependence on renal dialysis: Secondary | ICD-10-CM | POA: Diagnosis not present

## 2019-12-17 ENCOUNTER — Encounter: Payer: Self-pay | Admitting: Nurse Practitioner

## 2019-12-17 DIAGNOSIS — N186 End stage renal disease: Secondary | ICD-10-CM | POA: Diagnosis not present

## 2019-12-17 DIAGNOSIS — Z992 Dependence on renal dialysis: Secondary | ICD-10-CM | POA: Diagnosis not present

## 2019-12-17 DIAGNOSIS — N2581 Secondary hyperparathyroidism of renal origin: Secondary | ICD-10-CM | POA: Diagnosis not present

## 2019-12-17 DIAGNOSIS — D631 Anemia in chronic kidney disease: Secondary | ICD-10-CM | POA: Diagnosis not present

## 2019-12-19 DIAGNOSIS — N186 End stage renal disease: Secondary | ICD-10-CM | POA: Diagnosis not present

## 2019-12-19 DIAGNOSIS — N2581 Secondary hyperparathyroidism of renal origin: Secondary | ICD-10-CM | POA: Diagnosis not present

## 2019-12-19 DIAGNOSIS — D689 Coagulation defect, unspecified: Secondary | ICD-10-CM | POA: Diagnosis not present

## 2019-12-19 DIAGNOSIS — Z992 Dependence on renal dialysis: Secondary | ICD-10-CM | POA: Diagnosis not present

## 2019-12-19 DIAGNOSIS — D631 Anemia in chronic kidney disease: Secondary | ICD-10-CM | POA: Diagnosis not present

## 2019-12-20 ENCOUNTER — Encounter: Payer: Self-pay | Admitting: Nurse Practitioner

## 2019-12-20 LAB — POCT INR: INR: 1.4 — AB (ref <=1.1)

## 2019-12-21 DIAGNOSIS — D631 Anemia in chronic kidney disease: Secondary | ICD-10-CM | POA: Diagnosis not present

## 2019-12-21 DIAGNOSIS — Z992 Dependence on renal dialysis: Secondary | ICD-10-CM | POA: Diagnosis not present

## 2019-12-21 DIAGNOSIS — N186 End stage renal disease: Secondary | ICD-10-CM | POA: Diagnosis not present

## 2019-12-21 DIAGNOSIS — N2581 Secondary hyperparathyroidism of renal origin: Secondary | ICD-10-CM | POA: Diagnosis not present

## 2019-12-23 DIAGNOSIS — N186 End stage renal disease: Secondary | ICD-10-CM | POA: Diagnosis not present

## 2019-12-23 DIAGNOSIS — Z992 Dependence on renal dialysis: Secondary | ICD-10-CM | POA: Diagnosis not present

## 2019-12-23 DIAGNOSIS — I120 Hypertensive chronic kidney disease with stage 5 chronic kidney disease or end stage renal disease: Secondary | ICD-10-CM | POA: Diagnosis not present

## 2019-12-24 DIAGNOSIS — D631 Anemia in chronic kidney disease: Secondary | ICD-10-CM | POA: Diagnosis not present

## 2019-12-24 DIAGNOSIS — N186 End stage renal disease: Secondary | ICD-10-CM | POA: Diagnosis not present

## 2019-12-24 DIAGNOSIS — Z992 Dependence on renal dialysis: Secondary | ICD-10-CM | POA: Diagnosis not present

## 2019-12-24 DIAGNOSIS — N2581 Secondary hyperparathyroidism of renal origin: Secondary | ICD-10-CM | POA: Diagnosis not present

## 2019-12-26 DIAGNOSIS — I4892 Unspecified atrial flutter: Secondary | ICD-10-CM | POA: Diagnosis not present

## 2019-12-26 DIAGNOSIS — N186 End stage renal disease: Secondary | ICD-10-CM | POA: Diagnosis not present

## 2019-12-26 DIAGNOSIS — Z992 Dependence on renal dialysis: Secondary | ICD-10-CM | POA: Diagnosis not present

## 2019-12-26 DIAGNOSIS — D631 Anemia in chronic kidney disease: Secondary | ICD-10-CM | POA: Diagnosis not present

## 2019-12-26 DIAGNOSIS — N2581 Secondary hyperparathyroidism of renal origin: Secondary | ICD-10-CM | POA: Diagnosis not present

## 2019-12-26 LAB — POCT INR: INR: 1.3 — AB (ref <=1.1)

## 2019-12-28 ENCOUNTER — Encounter: Payer: Self-pay | Admitting: Nurse Practitioner

## 2019-12-28 DIAGNOSIS — N2581 Secondary hyperparathyroidism of renal origin: Secondary | ICD-10-CM | POA: Diagnosis not present

## 2019-12-28 DIAGNOSIS — D631 Anemia in chronic kidney disease: Secondary | ICD-10-CM | POA: Diagnosis not present

## 2019-12-28 DIAGNOSIS — Z992 Dependence on renal dialysis: Secondary | ICD-10-CM | POA: Diagnosis not present

## 2019-12-28 DIAGNOSIS — N186 End stage renal disease: Secondary | ICD-10-CM | POA: Diagnosis not present

## 2019-12-31 DIAGNOSIS — Z992 Dependence on renal dialysis: Secondary | ICD-10-CM | POA: Diagnosis not present

## 2019-12-31 DIAGNOSIS — D631 Anemia in chronic kidney disease: Secondary | ICD-10-CM | POA: Diagnosis not present

## 2019-12-31 DIAGNOSIS — N186 End stage renal disease: Secondary | ICD-10-CM | POA: Diagnosis not present

## 2019-12-31 DIAGNOSIS — N2581 Secondary hyperparathyroidism of renal origin: Secondary | ICD-10-CM | POA: Diagnosis not present

## 2020-01-02 DIAGNOSIS — Z992 Dependence on renal dialysis: Secondary | ICD-10-CM | POA: Diagnosis not present

## 2020-01-02 DIAGNOSIS — N186 End stage renal disease: Secondary | ICD-10-CM | POA: Diagnosis not present

## 2020-01-02 DIAGNOSIS — N2581 Secondary hyperparathyroidism of renal origin: Secondary | ICD-10-CM | POA: Diagnosis not present

## 2020-01-02 DIAGNOSIS — D631 Anemia in chronic kidney disease: Secondary | ICD-10-CM | POA: Diagnosis not present

## 2020-01-04 DIAGNOSIS — N2581 Secondary hyperparathyroidism of renal origin: Secondary | ICD-10-CM | POA: Diagnosis not present

## 2020-01-04 DIAGNOSIS — N186 End stage renal disease: Secondary | ICD-10-CM | POA: Diagnosis not present

## 2020-01-04 DIAGNOSIS — D631 Anemia in chronic kidney disease: Secondary | ICD-10-CM | POA: Diagnosis not present

## 2020-01-04 DIAGNOSIS — Z992 Dependence on renal dialysis: Secondary | ICD-10-CM | POA: Diagnosis not present

## 2020-01-04 DIAGNOSIS — D689 Coagulation defect, unspecified: Secondary | ICD-10-CM | POA: Diagnosis not present

## 2020-01-04 LAB — POCT INR: INR: 2 — AB (ref <=1.1)

## 2020-01-07 DIAGNOSIS — N2581 Secondary hyperparathyroidism of renal origin: Secondary | ICD-10-CM | POA: Diagnosis not present

## 2020-01-07 DIAGNOSIS — N186 End stage renal disease: Secondary | ICD-10-CM | POA: Diagnosis not present

## 2020-01-07 DIAGNOSIS — Z992 Dependence on renal dialysis: Secondary | ICD-10-CM | POA: Diagnosis not present

## 2020-01-07 DIAGNOSIS — D631 Anemia in chronic kidney disease: Secondary | ICD-10-CM | POA: Diagnosis not present

## 2020-01-09 ENCOUNTER — Encounter: Payer: Self-pay | Admitting: Nurse Practitioner

## 2020-01-09 DIAGNOSIS — Z992 Dependence on renal dialysis: Secondary | ICD-10-CM | POA: Diagnosis not present

## 2020-01-09 DIAGNOSIS — N2581 Secondary hyperparathyroidism of renal origin: Secondary | ICD-10-CM | POA: Diagnosis not present

## 2020-01-09 DIAGNOSIS — D689 Coagulation defect, unspecified: Secondary | ICD-10-CM | POA: Diagnosis not present

## 2020-01-09 DIAGNOSIS — N186 End stage renal disease: Secondary | ICD-10-CM | POA: Diagnosis not present

## 2020-01-09 DIAGNOSIS — D631 Anemia in chronic kidney disease: Secondary | ICD-10-CM | POA: Diagnosis not present

## 2020-01-09 LAB — POCT INR: INR: 1.4 — AB (ref 0.9–1.1)

## 2020-01-11 DIAGNOSIS — D631 Anemia in chronic kidney disease: Secondary | ICD-10-CM | POA: Diagnosis not present

## 2020-01-11 DIAGNOSIS — N186 End stage renal disease: Secondary | ICD-10-CM | POA: Diagnosis not present

## 2020-01-11 DIAGNOSIS — N2581 Secondary hyperparathyroidism of renal origin: Secondary | ICD-10-CM | POA: Diagnosis not present

## 2020-01-11 DIAGNOSIS — Z992 Dependence on renal dialysis: Secondary | ICD-10-CM | POA: Diagnosis not present

## 2020-01-14 ENCOUNTER — Encounter: Payer: Self-pay | Admitting: Nurse Practitioner

## 2020-01-14 DIAGNOSIS — D631 Anemia in chronic kidney disease: Secondary | ICD-10-CM | POA: Diagnosis not present

## 2020-01-14 DIAGNOSIS — Z992 Dependence on renal dialysis: Secondary | ICD-10-CM | POA: Diagnosis not present

## 2020-01-14 DIAGNOSIS — N2581 Secondary hyperparathyroidism of renal origin: Secondary | ICD-10-CM | POA: Diagnosis not present

## 2020-01-14 DIAGNOSIS — N186 End stage renal disease: Secondary | ICD-10-CM | POA: Diagnosis not present

## 2020-01-16 DIAGNOSIS — D631 Anemia in chronic kidney disease: Secondary | ICD-10-CM | POA: Diagnosis not present

## 2020-01-16 DIAGNOSIS — Z992 Dependence on renal dialysis: Secondary | ICD-10-CM | POA: Diagnosis not present

## 2020-01-16 DIAGNOSIS — D689 Coagulation defect, unspecified: Secondary | ICD-10-CM | POA: Diagnosis not present

## 2020-01-16 DIAGNOSIS — N186 End stage renal disease: Secondary | ICD-10-CM | POA: Diagnosis not present

## 2020-01-16 DIAGNOSIS — N2581 Secondary hyperparathyroidism of renal origin: Secondary | ICD-10-CM | POA: Diagnosis not present

## 2020-01-17 LAB — POCT INR: INR: 2.1 — AB (ref 0.9–1.1)

## 2020-01-18 DIAGNOSIS — N2581 Secondary hyperparathyroidism of renal origin: Secondary | ICD-10-CM | POA: Diagnosis not present

## 2020-01-18 DIAGNOSIS — N186 End stage renal disease: Secondary | ICD-10-CM | POA: Diagnosis not present

## 2020-01-18 DIAGNOSIS — Z992 Dependence on renal dialysis: Secondary | ICD-10-CM | POA: Diagnosis not present

## 2020-01-18 DIAGNOSIS — D631 Anemia in chronic kidney disease: Secondary | ICD-10-CM | POA: Diagnosis not present

## 2020-01-21 ENCOUNTER — Encounter: Payer: Self-pay | Admitting: Nurse Practitioner

## 2020-01-21 DIAGNOSIS — D631 Anemia in chronic kidney disease: Secondary | ICD-10-CM | POA: Diagnosis not present

## 2020-01-21 DIAGNOSIS — Z992 Dependence on renal dialysis: Secondary | ICD-10-CM | POA: Diagnosis not present

## 2020-01-21 DIAGNOSIS — N2581 Secondary hyperparathyroidism of renal origin: Secondary | ICD-10-CM | POA: Diagnosis not present

## 2020-01-21 DIAGNOSIS — N186 End stage renal disease: Secondary | ICD-10-CM | POA: Diagnosis not present

## 2020-01-23 DIAGNOSIS — N186 End stage renal disease: Secondary | ICD-10-CM | POA: Diagnosis not present

## 2020-01-23 DIAGNOSIS — Z992 Dependence on renal dialysis: Secondary | ICD-10-CM | POA: Diagnosis not present

## 2020-01-23 DIAGNOSIS — N2581 Secondary hyperparathyroidism of renal origin: Secondary | ICD-10-CM | POA: Diagnosis not present

## 2020-01-23 DIAGNOSIS — I4892 Unspecified atrial flutter: Secondary | ICD-10-CM | POA: Diagnosis not present

## 2020-01-23 DIAGNOSIS — I120 Hypertensive chronic kidney disease with stage 5 chronic kidney disease or end stage renal disease: Secondary | ICD-10-CM | POA: Diagnosis not present

## 2020-01-24 DIAGNOSIS — I120 Hypertensive chronic kidney disease with stage 5 chronic kidney disease or end stage renal disease: Secondary | ICD-10-CM | POA: Diagnosis not present

## 2020-01-24 DIAGNOSIS — Z7901 Long term (current) use of anticoagulants: Secondary | ICD-10-CM | POA: Diagnosis not present

## 2020-01-24 DIAGNOSIS — N186 End stage renal disease: Secondary | ICD-10-CM | POA: Diagnosis not present

## 2020-01-24 DIAGNOSIS — I2510 Atherosclerotic heart disease of native coronary artery without angina pectoris: Secondary | ICD-10-CM | POA: Diagnosis not present

## 2020-01-24 DIAGNOSIS — I5022 Chronic systolic (congestive) heart failure: Secondary | ICD-10-CM | POA: Diagnosis not present

## 2020-01-25 DIAGNOSIS — N186 End stage renal disease: Secondary | ICD-10-CM | POA: Diagnosis not present

## 2020-01-25 DIAGNOSIS — N2581 Secondary hyperparathyroidism of renal origin: Secondary | ICD-10-CM | POA: Diagnosis not present

## 2020-01-25 DIAGNOSIS — Z992 Dependence on renal dialysis: Secondary | ICD-10-CM | POA: Diagnosis not present

## 2020-01-26 LAB — POCT INR: INR: 1.4 — AB (ref 0.9–1.1)

## 2020-01-28 DIAGNOSIS — Z992 Dependence on renal dialysis: Secondary | ICD-10-CM | POA: Diagnosis not present

## 2020-01-28 DIAGNOSIS — N186 End stage renal disease: Secondary | ICD-10-CM | POA: Diagnosis not present

## 2020-01-28 DIAGNOSIS — N2581 Secondary hyperparathyroidism of renal origin: Secondary | ICD-10-CM | POA: Diagnosis not present

## 2020-01-30 DIAGNOSIS — D689 Coagulation defect, unspecified: Secondary | ICD-10-CM | POA: Diagnosis not present

## 2020-01-30 DIAGNOSIS — Z992 Dependence on renal dialysis: Secondary | ICD-10-CM | POA: Diagnosis not present

## 2020-01-30 DIAGNOSIS — N186 End stage renal disease: Secondary | ICD-10-CM | POA: Diagnosis not present

## 2020-01-30 DIAGNOSIS — N2581 Secondary hyperparathyroidism of renal origin: Secondary | ICD-10-CM | POA: Diagnosis not present

## 2020-01-30 DIAGNOSIS — D631 Anemia in chronic kidney disease: Secondary | ICD-10-CM | POA: Diagnosis not present

## 2020-01-31 DIAGNOSIS — T782XXD Anaphylactic shock, unspecified, subsequent encounter: Secondary | ICD-10-CM | POA: Insufficient documentation

## 2020-01-31 DIAGNOSIS — T7840XD Allergy, unspecified, subsequent encounter: Secondary | ICD-10-CM | POA: Insufficient documentation

## 2020-01-31 HISTORY — DX: Anaphylactic shock, unspecified, subsequent encounter: T78.2XXD

## 2020-01-31 LAB — POCT INR: INR: 1.9 — AB (ref 0.9–1.1)

## 2020-02-01 DIAGNOSIS — N2581 Secondary hyperparathyroidism of renal origin: Secondary | ICD-10-CM | POA: Diagnosis not present

## 2020-02-01 DIAGNOSIS — Z992 Dependence on renal dialysis: Secondary | ICD-10-CM | POA: Diagnosis not present

## 2020-02-01 DIAGNOSIS — N186 End stage renal disease: Secondary | ICD-10-CM | POA: Diagnosis not present

## 2020-02-04 DIAGNOSIS — Z992 Dependence on renal dialysis: Secondary | ICD-10-CM | POA: Diagnosis not present

## 2020-02-04 DIAGNOSIS — N186 End stage renal disease: Secondary | ICD-10-CM | POA: Diagnosis not present

## 2020-02-04 DIAGNOSIS — N2581 Secondary hyperparathyroidism of renal origin: Secondary | ICD-10-CM | POA: Diagnosis not present

## 2020-02-05 ENCOUNTER — Encounter: Payer: Self-pay | Admitting: Nurse Practitioner

## 2020-02-06 ENCOUNTER — Other Ambulatory Visit: Payer: Self-pay

## 2020-02-06 ENCOUNTER — Ambulatory Visit (INDEPENDENT_AMBULATORY_CARE_PROVIDER_SITE_OTHER): Payer: Medicare Other | Admitting: Nurse Practitioner

## 2020-02-06 ENCOUNTER — Encounter: Payer: Self-pay | Admitting: Nurse Practitioner

## 2020-02-06 VITALS — BP 118/72 | HR 89 | Temp 97.5°F | Ht 67.8 in | Wt 251.6 lb

## 2020-02-06 DIAGNOSIS — D689 Coagulation defect, unspecified: Secondary | ICD-10-CM | POA: Diagnosis not present

## 2020-02-06 DIAGNOSIS — Z992 Dependence on renal dialysis: Secondary | ICD-10-CM | POA: Diagnosis not present

## 2020-02-06 DIAGNOSIS — N2581 Secondary hyperparathyroidism of renal origin: Secondary | ICD-10-CM | POA: Diagnosis not present

## 2020-02-06 DIAGNOSIS — N644 Mastodynia: Secondary | ICD-10-CM

## 2020-02-06 DIAGNOSIS — N186 End stage renal disease: Secondary | ICD-10-CM | POA: Diagnosis not present

## 2020-02-06 DIAGNOSIS — D631 Anemia in chronic kidney disease: Secondary | ICD-10-CM | POA: Diagnosis not present

## 2020-02-06 LAB — POCT INR: INR: 4.1 — AB (ref 0.9–1.1)

## 2020-02-06 MED ORDER — AMOXICILLIN-POT CLAVULANATE 250-125 MG PO TABS: 1.0000 | ORAL_TABLET | Freq: Three times a day (TID) | ORAL | 0 refills | Status: DC

## 2020-02-06 NOTE — Progress Notes (Signed)
I,Yamilka Roman [employer] as a Education administrator for [employer], FNP.,have documented all relevant documentation on the behalf of [name], FNP,as directed by  [name], FNP while in the presence of [name], [place]. This visit occurred during the SARS-CoV-2 public health emergency.  Safety protocols were in place, including screening questions prior to the visit, additional usage of staff PPE, and extensive cleaning of exam room while observing appropriate contact time as indicated for disinfecting solutions.  Subjective:     Patient ID: [name] , male    DOB: [birth year] , 59 y.o.   MRN: [id number]   Chief Complaint  Patient presents with  . Breast Pain    patient stated his left breast has been hurting for the past week. he stated it is a thorbbing aching pain     HPI  He is having left breast tenderness.  The pain started a week ago. No medications.  Denies discharge from the nipple.  Denies family history of breast history.  Denies fever or new chills.     Past Medical History:  Diagnosis Date  . Acute lower GI bleeding 06/21/2018  . Acute respiratory failure with hypoxia ([place]) 01/13/2015  . Atrial flutter ([place])   . Bile leak, postoperative 03/15/2016  . Biliary dyskinesia 02/12/2016  . 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 Cardiomyopathy   . CHF (congestive heart failure) ([place])   . Coronary artery disease    normal coronaries by 10/10/08 cath, Cardiac Cath 08-04-12 epic.Dr. Doylene Canard follows  . Diabetes mellitus    pt. states he's borderline diabetic., no longer taking med,- off med. since 2013  . Dialysis patient [facility])    Mon-Wed-Fri([place])- Left AV fistula  . Diverticulitis November 2016   reoccurred in December 2016  . Diverticulitis of intestine without perforation or abscess without bleeding 04/21/2015  . DVT (deep venous thrombosis) ([place])   . ESRD (end stage renal disease) ([place])   . GERD (gastroesophageal reflux disease)    . Gout   . Hemorrhage of left kidney 01/13/2018  . History of nephrectomy 07/04/2012   History of right nephrectomy in 2002 for renal cell carcinoma   . History of unilateral nephrectomy   . Hypertension   . Low iron   . Myocardial infarction [facility]) ?2006  . Renal cell carcinoma    dialysis- [facility]- [address]- Dr. Mercy Shon Indelicato follows.  . Renal insufficiency   . Shortness of breath 05/19/11   "at rest, lying down, w/exertion"  . Stroke [facility]) 02/2011   05/19/11 denies residual  . Umbilical hernia 62/37/62   unrepaired  . Wears glasses      Family History  Problem Relation Age of Onset  . Hypertension Mother   . Kidney disease Mother      Current Outpatient Medications:  .  albuterol (VENTOLIN HFA) 108 (90 Base) MCG/ACT inhaler, Inhale 2 puffs into the lungs every 6 (six) hours as needed for wheezing or shortness of breath., Disp: 18 g, Rfl: 2 .  allopurinol (ZYLOPRIM) 100 MG tablet, Take 1 tablet (100 mg total) by mouth daily., Disp: 30 tablet, Rfl: 3 .  amiodarone (PACERONE) 200 MG tablet, Take 200 mg by mouth daily. , Disp: , Rfl:  .  calcium acetate (PHOSLO) 667 MG capsule, Take 1-3 capsules (667-2,001 mg total) by mouth See admin instructions. Take 2001 mg capsules with meals three times daily and 667 mg capsule with snacks. (Patient taking differently: Take 2,001 mg  by mouth daily. ), Disp:  , Rfl:  .  chlorhexidine (PERIDEX) 0.12 % solution, 15 mLs by Mouth Rinse route in the morning and at bedtime. , Disp: , Rfl:  .  cinacalcet (SENSIPAR) 30 MG tablet, Take 30 mg by mouth daily. , Disp: , Rfl:  .  diclofenac Sodium (VOLTAREN) 1 % GEL, Apply 2 g topically 4 (four) times daily., Disp: 100 g, Rfl: 2 .  ferric citrate (AURYXIA) 1 GM 210 MG(Fe) tablet, Take 210 mg by mouth 3 (three) times daily with meals. , Disp: , Rfl:  .  fexofenadine-pseudoephedrine (ALLEGRA-D) 60-120 MG 12 hr tablet, Take 1 tablet by mouth every 12 (twelve) hours., Disp: 30 tablet, Rfl: 0 .  ipratropium  (ATROVENT) 0.06 % nasal spray, Place 2 sprays into both nostrils 4 (four) times daily., Disp: 15 mL, Rfl: 12 .  Lidocaine, Anorectal, (L-M-X 5) 5 % CREA, Apply 1 application topically 4 (four) times daily as needed (apply to area up to 6 times daily as needed). (Patient taking differently: Apply 1 application topically See admin instructions. Apply up to six times daily.), Disp: 1 Tube, Rfl: 0 .  lidocaine-prilocaine (EMLA) cream, Apply 1 application topically See admin instructions. Apply topically to port access prior to dialysis - Monday, Wednesday, Friday., Disp: , Rfl: 12 .  midodrine (PROAMATINE) 10 MG tablet, Take 10 mg by mouth 3 (three) times daily., Disp: , Rfl:  .  multivitamin (RENA-VIT) TABS tablet, Take 1 tablet by mouth at bedtime., Disp: , Rfl:  .  nitroGLYCERIN (NITROSTAT) 0.4 MG SL tablet, Place 0.4 mg under the tongue every 5 (five) minutes as needed for chest pain. , Disp: , Rfl: 0 .  Nutritional Supplements (FEEDING SUPPLEMENT, NEPRO CARB STEADY,) LIQD, Take 237 mLs by mouth daily. , Disp: , Rfl:  .  ondansetron (ZOFRAN ODT) 4 MG disintegrating tablet, Take 1 tablet (4 mg total) by mouth every 8 (eight) hours as needed for nausea or vomiting., Disp: 20 tablet, Rfl: 0 .  pantoprazole (PROTONIX) 40 MG tablet, Take 40 mg by mouth 2 (two) times daily., Disp: , Rfl:  .  sucroferric oxyhydroxide (VELPHORO) 500 MG chewable tablet, Chew 500 mg by mouth daily. , Disp: , Rfl:  .  UNABLE TO FIND, Out patient physical therapy- vestibular therapy for BPPV, Disp: 1 each, Rfl: 0 .  UNABLE TO FIND, Med Name: ulceraflora as needed, Disp: , Rfl:  .  warfarin (COUMADIN) 2.5 MG tablet, Take 3 tablets (7.5 mg total) by mouth daily at 6 PM. (Patient taking differently: Take 2.5 mg by mouth daily. ), Disp: , Rfl:  .  amoxicillin-clavulanate (AUGMENTIN) 250-125 MG tablet, Take 1 tablet by mouth 3 (three) times daily., Disp: 30 tablet, Rfl: 0 .  HYDROcodone-homatropine (HYDROMET) 5-1.5 MG/5ML syrup, Take 5  mLs by mouth 2 (two) times daily as needed. (Patient not taking: Reported on 11/14/2019), Disp: 120 mL, Rfl: 0   Allergies  Allergen Reactions  . Ace Inhibitors Itching, Cough and Other (See Comments)     Review of Systems   Today's Vitals   02/06/20 1213  BP: 118/72  Pulse: 89  Temp: (!) 97.5 F (36.4 C)  TempSrc: Oral  Weight: 251 lb 9.6 oz (114.1 kg)  Height: 5' 7.8" (1.722 m)  PainSc: 8    Body mass index is 38.48 kg/m.   Objective:  Physical Exam Constitutional:      General: He is not in acute distress.    Appearance: Normal appearance.  Cardiovascular:  Rate and Rhythm: Normal rate and regular rhythm.     Pulses: Normal pulses.     Heart sounds: Normal heart sounds. No murmur heard.   Pulmonary:     Effort: Pulmonary effort is normal. No respiratory distress.     Breath sounds: Normal breath sounds.  Chest:     Chest wall: Tenderness (also having tenderness at 11 o'clock position) present.     Breasts: Breasts are symmetrical.        Right: No tenderness.        Left: Mass (firm area above nipple) and tenderness (firm area noted above left breast ) present.  Skin:    Capillary Refill: Capillary refill takes less than 2 seconds.  Neurological:     General: No focal deficit present.     Mental Status: He is alert and oriented to person, place, and time.     Cranial Nerves: No cranial nerve deficit.  Psychiatric:        Mood and Affect: Mood normal.        Behavior: Behavior normal.        Thought Content: Thought content normal.        Judgment: Judgment normal.         Assessment And Plan:     1. Breast pain, left  Tenderness to left chest at 11 o'clock position also has firm area noted just above left nipple.  He does have his fistula on the left forearm as well.    He is aware to take his medications after his dialysis on his dialysis days.  Will schedule for mammogram/ultrasound of breast left side - amoxicillin-clavulanate (AUGMENTIN)  250-125 MG tablet; Take 1 tablet by mouth 3 (three) times daily.  Dispense: 30 tablet; Refill: 0 - MM Digital Diagnostic Unilat L; Future     Patient was given opportunity to ask questions. Patient verbalized understanding of the plan and was able to repeat key elements of the plan. All questions were answered to their satisfaction.   Teola Bradley, FNP, have reviewed all documentation for this visit. The documentation on 02/06/20 for the exam, diagnosis, procedures, and orders are all accurate and complete.   THE PATIENT IS ENCOURAGED TO PRACTICE SOCIAL DISTANCING DUE TO THE COVID-19 PANDEMIC.

## 2020-02-08 ENCOUNTER — Encounter: Payer: Self-pay | Admitting: Nurse Practitioner

## 2020-02-08 DIAGNOSIS — Z992 Dependence on renal dialysis: Secondary | ICD-10-CM | POA: Diagnosis not present

## 2020-02-08 DIAGNOSIS — N186 End stage renal disease: Secondary | ICD-10-CM | POA: Diagnosis not present

## 2020-02-08 DIAGNOSIS — N2581 Secondary hyperparathyroidism of renal origin: Secondary | ICD-10-CM | POA: Diagnosis not present

## 2020-02-11 ENCOUNTER — Telehealth: Payer: Self-pay

## 2020-02-11 DIAGNOSIS — N2581 Secondary hyperparathyroidism of renal origin: Secondary | ICD-10-CM | POA: Diagnosis not present

## 2020-02-11 DIAGNOSIS — Z992 Dependence on renal dialysis: Secondary | ICD-10-CM | POA: Diagnosis not present

## 2020-02-11 DIAGNOSIS — N186 End stage renal disease: Secondary | ICD-10-CM | POA: Diagnosis not present

## 2020-02-11 NOTE — Telephone Encounter (Signed)
I called patient to see if he has been in contact with Dr.Coladonato regarding is INR he stated he has not I advised him to go ahead and give him a call so they can adjust his med. Pt stated he would call them. YL,RMA

## 2020-02-12 ENCOUNTER — Other Ambulatory Visit: Payer: Self-pay | Admitting: Nurse Practitioner

## 2020-02-12 DIAGNOSIS — N644 Mastodynia: Secondary | ICD-10-CM

## 2020-02-13 DIAGNOSIS — N186 End stage renal disease: Secondary | ICD-10-CM | POA: Diagnosis not present

## 2020-02-13 DIAGNOSIS — D631 Anemia in chronic kidney disease: Secondary | ICD-10-CM | POA: Diagnosis not present

## 2020-02-13 DIAGNOSIS — Z992 Dependence on renal dialysis: Secondary | ICD-10-CM | POA: Diagnosis not present

## 2020-02-13 DIAGNOSIS — N2581 Secondary hyperparathyroidism of renal origin: Secondary | ICD-10-CM | POA: Diagnosis not present

## 2020-02-13 DIAGNOSIS — D689 Coagulation defect, unspecified: Secondary | ICD-10-CM | POA: Diagnosis not present

## 2020-02-13 LAB — POCT INR: INR: 2.2 — AB (ref 0.9–1.1)

## 2020-02-15 DIAGNOSIS — Z992 Dependence on renal dialysis: Secondary | ICD-10-CM | POA: Diagnosis not present

## 2020-02-15 DIAGNOSIS — N186 End stage renal disease: Secondary | ICD-10-CM | POA: Diagnosis not present

## 2020-02-15 DIAGNOSIS — N2581 Secondary hyperparathyroidism of renal origin: Secondary | ICD-10-CM | POA: Diagnosis not present

## 2020-02-18 DIAGNOSIS — N2581 Secondary hyperparathyroidism of renal origin: Secondary | ICD-10-CM | POA: Diagnosis not present

## 2020-02-18 DIAGNOSIS — N186 End stage renal disease: Secondary | ICD-10-CM | POA: Diagnosis not present

## 2020-02-18 DIAGNOSIS — Z992 Dependence on renal dialysis: Secondary | ICD-10-CM | POA: Diagnosis not present

## 2020-02-19 ENCOUNTER — Encounter: Payer: Self-pay | Admitting: Nurse Practitioner

## 2020-02-19 ENCOUNTER — Ambulatory Visit: Payer: Medicare Other | Admitting: Nurse Practitioner

## 2020-02-20 DIAGNOSIS — D631 Anemia in chronic kidney disease: Secondary | ICD-10-CM | POA: Diagnosis not present

## 2020-02-20 DIAGNOSIS — N2581 Secondary hyperparathyroidism of renal origin: Secondary | ICD-10-CM | POA: Diagnosis not present

## 2020-02-20 DIAGNOSIS — D689 Coagulation defect, unspecified: Secondary | ICD-10-CM | POA: Diagnosis not present

## 2020-02-20 DIAGNOSIS — Z992 Dependence on renal dialysis: Secondary | ICD-10-CM | POA: Diagnosis not present

## 2020-02-20 DIAGNOSIS — N186 End stage renal disease: Secondary | ICD-10-CM | POA: Diagnosis not present

## 2020-02-20 LAB — POCT INR: INR: 1.9 — AB (ref <=1.1)

## 2020-02-22 ENCOUNTER — Encounter: Payer: Self-pay | Admitting: Nurse Practitioner

## 2020-02-22 DIAGNOSIS — D509 Iron deficiency anemia, unspecified: Secondary | ICD-10-CM | POA: Diagnosis not present

## 2020-02-22 DIAGNOSIS — Z992 Dependence on renal dialysis: Secondary | ICD-10-CM | POA: Diagnosis not present

## 2020-02-22 DIAGNOSIS — I120 Hypertensive chronic kidney disease with stage 5 chronic kidney disease or end stage renal disease: Secondary | ICD-10-CM | POA: Diagnosis not present

## 2020-02-22 DIAGNOSIS — N186 End stage renal disease: Secondary | ICD-10-CM | POA: Diagnosis not present

## 2020-02-22 DIAGNOSIS — D631 Anemia in chronic kidney disease: Secondary | ICD-10-CM | POA: Diagnosis not present

## 2020-02-22 DIAGNOSIS — N2581 Secondary hyperparathyroidism of renal origin: Secondary | ICD-10-CM | POA: Diagnosis not present

## 2020-02-22 DIAGNOSIS — Z23 Encounter for immunization: Secondary | ICD-10-CM | POA: Diagnosis not present

## 2020-02-25 DIAGNOSIS — D631 Anemia in chronic kidney disease: Secondary | ICD-10-CM | POA: Diagnosis not present

## 2020-02-25 DIAGNOSIS — Z992 Dependence on renal dialysis: Secondary | ICD-10-CM | POA: Diagnosis not present

## 2020-02-25 DIAGNOSIS — D509 Iron deficiency anemia, unspecified: Secondary | ICD-10-CM | POA: Diagnosis not present

## 2020-02-25 DIAGNOSIS — N186 End stage renal disease: Secondary | ICD-10-CM | POA: Diagnosis not present

## 2020-02-25 DIAGNOSIS — N2581 Secondary hyperparathyroidism of renal origin: Secondary | ICD-10-CM | POA: Diagnosis not present

## 2020-02-26 ENCOUNTER — Other Ambulatory Visit: Payer: Self-pay

## 2020-02-26 ENCOUNTER — Ambulatory Visit: Payer: Medicare Other

## 2020-02-26 ENCOUNTER — Ambulatory Visit
Admission: RE | Admit: 2020-02-26 | Discharge: 2020-02-26 | Disposition: A | Discharge status: Home / Self Care | Payer: Medicare Other | Source: Ambulatory Visit | Attending: Nurse Practitioner | Admitting: Nurse Practitioner

## 2020-02-26 DIAGNOSIS — N186 End stage renal disease: Secondary | ICD-10-CM | POA: Diagnosis not present

## 2020-02-26 DIAGNOSIS — I120 Hypertensive chronic kidney disease with stage 5 chronic kidney disease or end stage renal disease: Secondary | ICD-10-CM | POA: Diagnosis not present

## 2020-02-26 DIAGNOSIS — N644 Mastodynia: Secondary | ICD-10-CM

## 2020-02-26 DIAGNOSIS — I2510 Atherosclerotic heart disease of native coronary artery without angina pectoris: Secondary | ICD-10-CM | POA: Diagnosis not present

## 2020-02-26 DIAGNOSIS — Z7901 Long term (current) use of anticoagulants: Secondary | ICD-10-CM | POA: Diagnosis not present

## 2020-02-26 DIAGNOSIS — I5022 Chronic systolic (congestive) heart failure: Secondary | ICD-10-CM | POA: Diagnosis not present

## 2020-02-26 DIAGNOSIS — N62 Hypertrophy of breast: Secondary | ICD-10-CM | POA: Diagnosis not present

## 2020-02-27 DIAGNOSIS — D631 Anemia in chronic kidney disease: Secondary | ICD-10-CM | POA: Diagnosis not present

## 2020-02-27 DIAGNOSIS — Z992 Dependence on renal dialysis: Secondary | ICD-10-CM | POA: Diagnosis not present

## 2020-02-27 DIAGNOSIS — I4892 Unspecified atrial flutter: Secondary | ICD-10-CM | POA: Diagnosis not present

## 2020-02-27 DIAGNOSIS — N186 End stage renal disease: Secondary | ICD-10-CM | POA: Diagnosis not present

## 2020-02-27 DIAGNOSIS — D509 Iron deficiency anemia, unspecified: Secondary | ICD-10-CM | POA: Diagnosis not present

## 2020-02-27 DIAGNOSIS — E1129 Type 2 diabetes mellitus with other diabetic kidney complication: Secondary | ICD-10-CM | POA: Diagnosis not present

## 2020-02-27 DIAGNOSIS — N2581 Secondary hyperparathyroidism of renal origin: Secondary | ICD-10-CM | POA: Diagnosis not present

## 2020-02-29 DIAGNOSIS — N186 End stage renal disease: Secondary | ICD-10-CM | POA: Diagnosis not present

## 2020-02-29 DIAGNOSIS — D509 Iron deficiency anemia, unspecified: Secondary | ICD-10-CM | POA: Diagnosis not present

## 2020-02-29 DIAGNOSIS — Z992 Dependence on renal dialysis: Secondary | ICD-10-CM | POA: Diagnosis not present

## 2020-02-29 DIAGNOSIS — N2581 Secondary hyperparathyroidism of renal origin: Secondary | ICD-10-CM | POA: Diagnosis not present

## 2020-02-29 DIAGNOSIS — D631 Anemia in chronic kidney disease: Secondary | ICD-10-CM | POA: Diagnosis not present

## 2020-03-03 DIAGNOSIS — N186 End stage renal disease: Secondary | ICD-10-CM | POA: Diagnosis not present

## 2020-03-03 DIAGNOSIS — N2581 Secondary hyperparathyroidism of renal origin: Secondary | ICD-10-CM | POA: Diagnosis not present

## 2020-03-03 DIAGNOSIS — D509 Iron deficiency anemia, unspecified: Secondary | ICD-10-CM | POA: Diagnosis not present

## 2020-03-03 DIAGNOSIS — Z992 Dependence on renal dialysis: Secondary | ICD-10-CM | POA: Diagnosis not present

## 2020-03-03 DIAGNOSIS — D631 Anemia in chronic kidney disease: Secondary | ICD-10-CM | POA: Diagnosis not present

## 2020-03-05 DIAGNOSIS — N186 End stage renal disease: Secondary | ICD-10-CM | POA: Diagnosis not present

## 2020-03-05 DIAGNOSIS — D689 Coagulation defect, unspecified: Secondary | ICD-10-CM | POA: Diagnosis not present

## 2020-03-05 DIAGNOSIS — Z992 Dependence on renal dialysis: Secondary | ICD-10-CM | POA: Diagnosis not present

## 2020-03-05 DIAGNOSIS — N2581 Secondary hyperparathyroidism of renal origin: Secondary | ICD-10-CM | POA: Diagnosis not present

## 2020-03-05 DIAGNOSIS — D509 Iron deficiency anemia, unspecified: Secondary | ICD-10-CM | POA: Diagnosis not present

## 2020-03-05 DIAGNOSIS — D631 Anemia in chronic kidney disease: Secondary | ICD-10-CM | POA: Diagnosis not present

## 2020-03-05 LAB — POCT INR: INR: 2.7 — AB (ref <=1.1)

## 2020-03-07 ENCOUNTER — Encounter: Payer: Self-pay | Admitting: Nurse Practitioner

## 2020-03-07 DIAGNOSIS — D509 Iron deficiency anemia, unspecified: Secondary | ICD-10-CM | POA: Diagnosis not present

## 2020-03-07 DIAGNOSIS — D631 Anemia in chronic kidney disease: Secondary | ICD-10-CM | POA: Diagnosis not present

## 2020-03-07 DIAGNOSIS — N2581 Secondary hyperparathyroidism of renal origin: Secondary | ICD-10-CM | POA: Diagnosis not present

## 2020-03-07 DIAGNOSIS — Z992 Dependence on renal dialysis: Secondary | ICD-10-CM | POA: Diagnosis not present

## 2020-03-07 DIAGNOSIS — N186 End stage renal disease: Secondary | ICD-10-CM | POA: Diagnosis not present

## 2020-03-10 DIAGNOSIS — N186 End stage renal disease: Secondary | ICD-10-CM | POA: Diagnosis not present

## 2020-03-10 DIAGNOSIS — D631 Anemia in chronic kidney disease: Secondary | ICD-10-CM | POA: Diagnosis not present

## 2020-03-10 DIAGNOSIS — N2581 Secondary hyperparathyroidism of renal origin: Secondary | ICD-10-CM | POA: Diagnosis not present

## 2020-03-10 DIAGNOSIS — Z992 Dependence on renal dialysis: Secondary | ICD-10-CM | POA: Diagnosis not present

## 2020-03-10 DIAGNOSIS — D509 Iron deficiency anemia, unspecified: Secondary | ICD-10-CM | POA: Diagnosis not present

## 2020-03-12 DIAGNOSIS — Z992 Dependence on renal dialysis: Secondary | ICD-10-CM | POA: Diagnosis not present

## 2020-03-12 DIAGNOSIS — N2581 Secondary hyperparathyroidism of renal origin: Secondary | ICD-10-CM | POA: Diagnosis not present

## 2020-03-12 DIAGNOSIS — N186 End stage renal disease: Secondary | ICD-10-CM | POA: Diagnosis not present

## 2020-03-12 DIAGNOSIS — D689 Coagulation defect, unspecified: Secondary | ICD-10-CM | POA: Diagnosis not present

## 2020-03-12 DIAGNOSIS — D509 Iron deficiency anemia, unspecified: Secondary | ICD-10-CM | POA: Diagnosis not present

## 2020-03-12 DIAGNOSIS — D631 Anemia in chronic kidney disease: Secondary | ICD-10-CM | POA: Diagnosis not present

## 2020-03-14 DIAGNOSIS — D631 Anemia in chronic kidney disease: Secondary | ICD-10-CM | POA: Diagnosis not present

## 2020-03-14 DIAGNOSIS — N2581 Secondary hyperparathyroidism of renal origin: Secondary | ICD-10-CM | POA: Diagnosis not present

## 2020-03-14 DIAGNOSIS — N186 End stage renal disease: Secondary | ICD-10-CM | POA: Diagnosis not present

## 2020-03-14 DIAGNOSIS — D509 Iron deficiency anemia, unspecified: Secondary | ICD-10-CM | POA: Diagnosis not present

## 2020-03-14 DIAGNOSIS — Z992 Dependence on renal dialysis: Secondary | ICD-10-CM | POA: Diagnosis not present

## 2020-03-17 DIAGNOSIS — N2581 Secondary hyperparathyroidism of renal origin: Secondary | ICD-10-CM | POA: Diagnosis not present

## 2020-03-17 DIAGNOSIS — D509 Iron deficiency anemia, unspecified: Secondary | ICD-10-CM | POA: Diagnosis not present

## 2020-03-17 DIAGNOSIS — N186 End stage renal disease: Secondary | ICD-10-CM | POA: Diagnosis not present

## 2020-03-17 DIAGNOSIS — Z992 Dependence on renal dialysis: Secondary | ICD-10-CM | POA: Diagnosis not present

## 2020-03-17 DIAGNOSIS — D631 Anemia in chronic kidney disease: Secondary | ICD-10-CM | POA: Diagnosis not present

## 2020-03-19 DIAGNOSIS — D689 Coagulation defect, unspecified: Secondary | ICD-10-CM | POA: Diagnosis not present

## 2020-03-19 DIAGNOSIS — D631 Anemia in chronic kidney disease: Secondary | ICD-10-CM | POA: Diagnosis not present

## 2020-03-19 DIAGNOSIS — Z992 Dependence on renal dialysis: Secondary | ICD-10-CM | POA: Diagnosis not present

## 2020-03-19 DIAGNOSIS — N2581 Secondary hyperparathyroidism of renal origin: Secondary | ICD-10-CM | POA: Diagnosis not present

## 2020-03-19 DIAGNOSIS — N186 End stage renal disease: Secondary | ICD-10-CM | POA: Diagnosis not present

## 2020-03-19 DIAGNOSIS — D509 Iron deficiency anemia, unspecified: Secondary | ICD-10-CM | POA: Diagnosis not present

## 2020-03-21 DIAGNOSIS — N2581 Secondary hyperparathyroidism of renal origin: Secondary | ICD-10-CM | POA: Diagnosis not present

## 2020-03-21 DIAGNOSIS — Z992 Dependence on renal dialysis: Secondary | ICD-10-CM | POA: Diagnosis not present

## 2020-03-21 DIAGNOSIS — D509 Iron deficiency anemia, unspecified: Secondary | ICD-10-CM | POA: Diagnosis not present

## 2020-03-21 DIAGNOSIS — N186 End stage renal disease: Secondary | ICD-10-CM | POA: Diagnosis not present

## 2020-03-21 DIAGNOSIS — D631 Anemia in chronic kidney disease: Secondary | ICD-10-CM | POA: Diagnosis not present

## 2020-03-24 DIAGNOSIS — Z992 Dependence on renal dialysis: Secondary | ICD-10-CM | POA: Diagnosis not present

## 2020-03-24 DIAGNOSIS — I120 Hypertensive chronic kidney disease with stage 5 chronic kidney disease or end stage renal disease: Secondary | ICD-10-CM | POA: Diagnosis not present

## 2020-03-24 DIAGNOSIS — N2581 Secondary hyperparathyroidism of renal origin: Secondary | ICD-10-CM | POA: Diagnosis not present

## 2020-03-24 DIAGNOSIS — D509 Iron deficiency anemia, unspecified: Secondary | ICD-10-CM | POA: Diagnosis not present

## 2020-03-24 DIAGNOSIS — D631 Anemia in chronic kidney disease: Secondary | ICD-10-CM | POA: Diagnosis not present

## 2020-03-24 DIAGNOSIS — N186 End stage renal disease: Secondary | ICD-10-CM | POA: Diagnosis not present

## 2020-03-25 DIAGNOSIS — I340 Nonrheumatic mitral (valve) insufficiency: Secondary | ICD-10-CM | POA: Diagnosis not present

## 2020-03-25 DIAGNOSIS — I5022 Chronic systolic (congestive) heart failure: Secondary | ICD-10-CM | POA: Diagnosis not present

## 2020-03-25 DIAGNOSIS — I361 Nonrheumatic tricuspid (valve) insufficiency: Secondary | ICD-10-CM | POA: Diagnosis not present

## 2020-03-25 DIAGNOSIS — I2510 Atherosclerotic heart disease of native coronary artery without angina pectoris: Secondary | ICD-10-CM | POA: Diagnosis not present

## 2020-03-26 DIAGNOSIS — N2581 Secondary hyperparathyroidism of renal origin: Secondary | ICD-10-CM | POA: Diagnosis not present

## 2020-03-26 DIAGNOSIS — D509 Iron deficiency anemia, unspecified: Secondary | ICD-10-CM | POA: Diagnosis not present

## 2020-03-26 DIAGNOSIS — Z992 Dependence on renal dialysis: Secondary | ICD-10-CM | POA: Diagnosis not present

## 2020-03-26 DIAGNOSIS — N186 End stage renal disease: Secondary | ICD-10-CM | POA: Diagnosis not present

## 2020-03-26 DIAGNOSIS — D631 Anemia in chronic kidney disease: Secondary | ICD-10-CM | POA: Diagnosis not present

## 2020-03-28 DIAGNOSIS — D509 Iron deficiency anemia, unspecified: Secondary | ICD-10-CM | POA: Diagnosis not present

## 2020-03-28 DIAGNOSIS — N186 End stage renal disease: Secondary | ICD-10-CM | POA: Diagnosis not present

## 2020-03-28 DIAGNOSIS — N2581 Secondary hyperparathyroidism of renal origin: Secondary | ICD-10-CM | POA: Diagnosis not present

## 2020-03-28 DIAGNOSIS — D631 Anemia in chronic kidney disease: Secondary | ICD-10-CM | POA: Diagnosis not present

## 2020-03-28 DIAGNOSIS — Z992 Dependence on renal dialysis: Secondary | ICD-10-CM | POA: Diagnosis not present

## 2020-03-31 DIAGNOSIS — Z992 Dependence on renal dialysis: Secondary | ICD-10-CM | POA: Diagnosis not present

## 2020-03-31 DIAGNOSIS — N186 End stage renal disease: Secondary | ICD-10-CM | POA: Diagnosis not present

## 2020-03-31 DIAGNOSIS — D631 Anemia in chronic kidney disease: Secondary | ICD-10-CM | POA: Diagnosis not present

## 2020-03-31 DIAGNOSIS — N2581 Secondary hyperparathyroidism of renal origin: Secondary | ICD-10-CM | POA: Diagnosis not present

## 2020-03-31 DIAGNOSIS — D509 Iron deficiency anemia, unspecified: Secondary | ICD-10-CM | POA: Diagnosis not present

## 2020-04-02 DIAGNOSIS — N2581 Secondary hyperparathyroidism of renal origin: Secondary | ICD-10-CM | POA: Diagnosis not present

## 2020-04-02 DIAGNOSIS — D631 Anemia in chronic kidney disease: Secondary | ICD-10-CM | POA: Diagnosis not present

## 2020-04-02 DIAGNOSIS — N186 End stage renal disease: Secondary | ICD-10-CM | POA: Diagnosis not present

## 2020-04-02 DIAGNOSIS — Z992 Dependence on renal dialysis: Secondary | ICD-10-CM | POA: Diagnosis not present

## 2020-04-02 DIAGNOSIS — D509 Iron deficiency anemia, unspecified: Secondary | ICD-10-CM | POA: Diagnosis not present

## 2020-04-02 DIAGNOSIS — D689 Coagulation defect, unspecified: Secondary | ICD-10-CM | POA: Diagnosis not present

## 2020-04-02 LAB — POCT INR: INR: 1.3 — AB (ref 0.9–1.1)

## 2020-04-04 DIAGNOSIS — N186 End stage renal disease: Secondary | ICD-10-CM | POA: Diagnosis not present

## 2020-04-04 DIAGNOSIS — N2581 Secondary hyperparathyroidism of renal origin: Secondary | ICD-10-CM | POA: Diagnosis not present

## 2020-04-04 DIAGNOSIS — D631 Anemia in chronic kidney disease: Secondary | ICD-10-CM | POA: Diagnosis not present

## 2020-04-04 DIAGNOSIS — D509 Iron deficiency anemia, unspecified: Secondary | ICD-10-CM | POA: Diagnosis not present

## 2020-04-04 DIAGNOSIS — Z992 Dependence on renal dialysis: Secondary | ICD-10-CM | POA: Diagnosis not present

## 2020-04-07 ENCOUNTER — Encounter: Payer: Self-pay | Admitting: Nurse Practitioner

## 2020-04-07 DIAGNOSIS — N2581 Secondary hyperparathyroidism of renal origin: Secondary | ICD-10-CM | POA: Diagnosis not present

## 2020-04-07 DIAGNOSIS — Z992 Dependence on renal dialysis: Secondary | ICD-10-CM | POA: Diagnosis not present

## 2020-04-07 DIAGNOSIS — D509 Iron deficiency anemia, unspecified: Secondary | ICD-10-CM | POA: Diagnosis not present

## 2020-04-07 DIAGNOSIS — N186 End stage renal disease: Secondary | ICD-10-CM | POA: Diagnosis not present

## 2020-04-07 DIAGNOSIS — D631 Anemia in chronic kidney disease: Secondary | ICD-10-CM | POA: Diagnosis not present

## 2020-04-09 DIAGNOSIS — D631 Anemia in chronic kidney disease: Secondary | ICD-10-CM | POA: Diagnosis not present

## 2020-04-09 DIAGNOSIS — D509 Iron deficiency anemia, unspecified: Secondary | ICD-10-CM | POA: Diagnosis not present

## 2020-04-09 DIAGNOSIS — Z992 Dependence on renal dialysis: Secondary | ICD-10-CM | POA: Diagnosis not present

## 2020-04-09 DIAGNOSIS — N186 End stage renal disease: Secondary | ICD-10-CM | POA: Diagnosis not present

## 2020-04-09 DIAGNOSIS — D689 Coagulation defect, unspecified: Secondary | ICD-10-CM | POA: Diagnosis not present

## 2020-04-09 DIAGNOSIS — N2581 Secondary hyperparathyroidism of renal origin: Secondary | ICD-10-CM | POA: Diagnosis not present

## 2020-04-10 LAB — POCT INR: INR: 1.8 — AB (ref <=1.1)

## 2020-04-11 DIAGNOSIS — D631 Anemia in chronic kidney disease: Secondary | ICD-10-CM | POA: Diagnosis not present

## 2020-04-11 DIAGNOSIS — N186 End stage renal disease: Secondary | ICD-10-CM | POA: Diagnosis not present

## 2020-04-11 DIAGNOSIS — Z992 Dependence on renal dialysis: Secondary | ICD-10-CM | POA: Diagnosis not present

## 2020-04-11 DIAGNOSIS — D509 Iron deficiency anemia, unspecified: Secondary | ICD-10-CM | POA: Diagnosis not present

## 2020-04-11 DIAGNOSIS — N2581 Secondary hyperparathyroidism of renal origin: Secondary | ICD-10-CM | POA: Diagnosis not present

## 2020-04-13 DIAGNOSIS — N186 End stage renal disease: Secondary | ICD-10-CM | POA: Diagnosis not present

## 2020-04-13 DIAGNOSIS — Z992 Dependence on renal dialysis: Secondary | ICD-10-CM | POA: Diagnosis not present

## 2020-04-13 DIAGNOSIS — D631 Anemia in chronic kidney disease: Secondary | ICD-10-CM | POA: Diagnosis not present

## 2020-04-13 DIAGNOSIS — D509 Iron deficiency anemia, unspecified: Secondary | ICD-10-CM | POA: Diagnosis not present

## 2020-04-13 DIAGNOSIS — N2581 Secondary hyperparathyroidism of renal origin: Secondary | ICD-10-CM | POA: Diagnosis not present

## 2020-04-14 ENCOUNTER — Encounter: Payer: Self-pay | Admitting: Nurse Practitioner

## 2020-04-15 DIAGNOSIS — Z992 Dependence on renal dialysis: Secondary | ICD-10-CM | POA: Diagnosis not present

## 2020-04-15 DIAGNOSIS — N186 End stage renal disease: Secondary | ICD-10-CM | POA: Diagnosis not present

## 2020-04-15 DIAGNOSIS — D689 Coagulation defect, unspecified: Secondary | ICD-10-CM | POA: Diagnosis not present

## 2020-04-15 DIAGNOSIS — D631 Anemia in chronic kidney disease: Secondary | ICD-10-CM | POA: Diagnosis not present

## 2020-04-15 DIAGNOSIS — N2581 Secondary hyperparathyroidism of renal origin: Secondary | ICD-10-CM | POA: Diagnosis not present

## 2020-04-15 DIAGNOSIS — D509 Iron deficiency anemia, unspecified: Secondary | ICD-10-CM | POA: Diagnosis not present

## 2020-04-15 LAB — POCT INR: INR: 1.4 — AB (ref 0.9–1.1)

## 2020-04-18 DIAGNOSIS — N186 End stage renal disease: Secondary | ICD-10-CM | POA: Diagnosis not present

## 2020-04-18 DIAGNOSIS — Z992 Dependence on renal dialysis: Secondary | ICD-10-CM | POA: Diagnosis not present

## 2020-04-18 DIAGNOSIS — D509 Iron deficiency anemia, unspecified: Secondary | ICD-10-CM | POA: Diagnosis not present

## 2020-04-18 DIAGNOSIS — N2581 Secondary hyperparathyroidism of renal origin: Secondary | ICD-10-CM | POA: Diagnosis not present

## 2020-04-18 DIAGNOSIS — D631 Anemia in chronic kidney disease: Secondary | ICD-10-CM | POA: Diagnosis not present

## 2020-04-21 DIAGNOSIS — N186 End stage renal disease: Secondary | ICD-10-CM | POA: Diagnosis not present

## 2020-04-21 DIAGNOSIS — N2581 Secondary hyperparathyroidism of renal origin: Secondary | ICD-10-CM | POA: Diagnosis not present

## 2020-04-21 DIAGNOSIS — Z992 Dependence on renal dialysis: Secondary | ICD-10-CM | POA: Diagnosis not present

## 2020-04-21 DIAGNOSIS — D509 Iron deficiency anemia, unspecified: Secondary | ICD-10-CM | POA: Diagnosis not present

## 2020-04-21 DIAGNOSIS — D631 Anemia in chronic kidney disease: Secondary | ICD-10-CM | POA: Diagnosis not present

## 2020-04-23 ENCOUNTER — Encounter: Payer: Self-pay | Admitting: Nurse Practitioner

## 2020-04-23 DIAGNOSIS — Z20822 Contact with and (suspected) exposure to covid-19: Secondary | ICD-10-CM | POA: Diagnosis not present

## 2020-04-23 DIAGNOSIS — Z992 Dependence on renal dialysis: Secondary | ICD-10-CM | POA: Diagnosis not present

## 2020-04-23 DIAGNOSIS — D509 Iron deficiency anemia, unspecified: Secondary | ICD-10-CM | POA: Diagnosis not present

## 2020-04-23 DIAGNOSIS — R0989 Other specified symptoms and signs involving the circulatory and respiratory systems: Secondary | ICD-10-CM | POA: Diagnosis not present

## 2020-04-23 DIAGNOSIS — R0789 Other chest pain: Secondary | ICD-10-CM | POA: Diagnosis not present

## 2020-04-23 DIAGNOSIS — R918 Other nonspecific abnormal finding of lung field: Secondary | ICD-10-CM | POA: Diagnosis not present

## 2020-04-23 DIAGNOSIS — I120 Hypertensive chronic kidney disease with stage 5 chronic kidney disease or end stage renal disease: Secondary | ICD-10-CM | POA: Diagnosis not present

## 2020-04-23 DIAGNOSIS — I517 Cardiomegaly: Secondary | ICD-10-CM | POA: Diagnosis not present

## 2020-04-23 DIAGNOSIS — N2581 Secondary hyperparathyroidism of renal origin: Secondary | ICD-10-CM | POA: Diagnosis not present

## 2020-04-23 DIAGNOSIS — J984 Other disorders of lung: Secondary | ICD-10-CM | POA: Diagnosis not present

## 2020-04-23 DIAGNOSIS — R000 Tachycardia, unspecified: Secondary | ICD-10-CM | POA: Diagnosis not present

## 2020-04-23 DIAGNOSIS — N186 End stage renal disease: Secondary | ICD-10-CM | POA: Diagnosis not present

## 2020-04-23 DIAGNOSIS — I4892 Unspecified atrial flutter: Secondary | ICD-10-CM | POA: Diagnosis not present

## 2020-04-23 DIAGNOSIS — D631 Anemia in chronic kidney disease: Secondary | ICD-10-CM | POA: Diagnosis not present

## 2020-04-23 LAB — POCT INR: INR: 3.1 — AB (ref <=1.1)

## 2020-04-24 ENCOUNTER — Other Ambulatory Visit: Payer: Self-pay

## 2020-04-24 ENCOUNTER — Encounter (HOSPITAL_COMMUNITY): Payer: Self-pay | Admitting: Emergency Medicine

## 2020-04-24 ENCOUNTER — Emergency Department (HOSPITAL_COMMUNITY): Payer: Medicare Other

## 2020-04-24 ENCOUNTER — Emergency Department (HOSPITAL_COMMUNITY)
Admission: EM | Admit: 2020-04-24 | Discharge: 2020-04-25 | Disposition: A | Discharge status: Home / Self Care | Payer: Medicare Other | Attending: Emergency Medicine | Admitting: Emergency Medicine

## 2020-04-24 DIAGNOSIS — I5043 Acute on chronic combined systolic (congestive) and diastolic (congestive) heart failure: Secondary | ICD-10-CM | POA: Diagnosis not present

## 2020-04-24 DIAGNOSIS — Z8553 Personal history of malignant neoplasm of renal pelvis: Secondary | ICD-10-CM | POA: Diagnosis not present

## 2020-04-24 DIAGNOSIS — R0600 Dyspnea, unspecified: Secondary | ICD-10-CM | POA: Insufficient documentation

## 2020-04-24 DIAGNOSIS — R062 Wheezing: Secondary | ICD-10-CM | POA: Diagnosis not present

## 2020-04-24 DIAGNOSIS — Z992 Dependence on renal dialysis: Secondary | ICD-10-CM | POA: Insufficient documentation

## 2020-04-24 DIAGNOSIS — R000 Tachycardia, unspecified: Secondary | ICD-10-CM | POA: Insufficient documentation

## 2020-04-24 DIAGNOSIS — Z7189 Other specified counseling: Secondary | ICD-10-CM

## 2020-04-24 DIAGNOSIS — R079 Chest pain, unspecified: Secondary | ICD-10-CM | POA: Diagnosis present

## 2020-04-24 DIAGNOSIS — R0602 Shortness of breath: Secondary | ICD-10-CM | POA: Insufficient documentation

## 2020-04-24 DIAGNOSIS — R1110 Vomiting, unspecified: Secondary | ICD-10-CM | POA: Insufficient documentation

## 2020-04-24 DIAGNOSIS — I509 Heart failure, unspecified: Secondary | ICD-10-CM

## 2020-04-24 DIAGNOSIS — R059 Cough, unspecified: Secondary | ICD-10-CM | POA: Insufficient documentation

## 2020-04-24 DIAGNOSIS — R600 Localized edema: Secondary | ICD-10-CM | POA: Diagnosis not present

## 2020-04-24 DIAGNOSIS — I132 Hypertensive heart and chronic kidney disease with heart failure and with stage 5 chronic kidney disease, or end stage renal disease: Secondary | ICD-10-CM | POA: Diagnosis not present

## 2020-04-24 DIAGNOSIS — E1122 Type 2 diabetes mellitus with diabetic chronic kidney disease: Secondary | ICD-10-CM | POA: Diagnosis not present

## 2020-04-24 DIAGNOSIS — R197 Diarrhea, unspecified: Secondary | ICD-10-CM | POA: Diagnosis not present

## 2020-04-24 DIAGNOSIS — J9811 Atelectasis: Secondary | ICD-10-CM | POA: Diagnosis not present

## 2020-04-24 DIAGNOSIS — R0789 Other chest pain: Secondary | ICD-10-CM | POA: Diagnosis not present

## 2020-04-24 DIAGNOSIS — I2510 Atherosclerotic heart disease of native coronary artery without angina pectoris: Secondary | ICD-10-CM | POA: Diagnosis not present

## 2020-04-24 DIAGNOSIS — J984 Other disorders of lung: Secondary | ICD-10-CM | POA: Diagnosis not present

## 2020-04-24 DIAGNOSIS — N186 End stage renal disease: Secondary | ICD-10-CM

## 2020-04-24 DIAGNOSIS — Z20822 Contact with and (suspected) exposure to covid-19: Secondary | ICD-10-CM | POA: Diagnosis not present

## 2020-04-24 DIAGNOSIS — Z7901 Long term (current) use of anticoagulants: Secondary | ICD-10-CM | POA: Diagnosis not present

## 2020-04-24 DIAGNOSIS — Z79899 Other long term (current) drug therapy: Secondary | ICD-10-CM | POA: Diagnosis not present

## 2020-04-24 DIAGNOSIS — R0989 Other specified symptoms and signs involving the circulatory and respiratory systems: Secondary | ICD-10-CM | POA: Diagnosis not present

## 2020-04-24 DIAGNOSIS — R069 Unspecified abnormalities of breathing: Secondary | ICD-10-CM | POA: Diagnosis not present

## 2020-04-24 DIAGNOSIS — R918 Other nonspecific abnormal finding of lung field: Secondary | ICD-10-CM | POA: Diagnosis not present

## 2020-04-24 DIAGNOSIS — I517 Cardiomegaly: Secondary | ICD-10-CM | POA: Diagnosis not present

## 2020-04-24 NOTE — ED Triage Notes (Signed)
Patient arrived with EMS from home reports exposure to Covid + brother last week , patient stated fatigue with generalized body aches , productive cough and chest congestion this week .

## 2020-04-25 DIAGNOSIS — I132 Hypertensive heart and chronic kidney disease with heart failure and with stage 5 chronic kidney disease, or end stage renal disease: Secondary | ICD-10-CM | POA: Diagnosis not present

## 2020-04-25 LAB — CBC
HCT: 30.3 % — ABNORMAL LOW (ref 39.0–52.0)
Hemoglobin: 10 g/dL — ABNORMAL LOW (ref 13.0–17.0)
MCH: 30.2 pg (ref 26.0–34.0)
MCHC: 33 g/dL (ref 30.0–36.0)
MCV: 91.5 fL (ref 80.0–100.0)
Platelets: 239 10*3/uL (ref 150–400)
RBC: 3.31 MIL/uL — ABNORMAL LOW (ref 4.22–5.81)
RDW: 16.9 % — ABNORMAL HIGH (ref 11.5–15.5)
WBC: 7.1 10*3/uL (ref 4.0–10.5)
nRBC: 0 % (ref 0.0–0.2)

## 2020-04-25 LAB — COMPREHENSIVE METABOLIC PANEL
ALT: 10 U/L (ref 0–44)
AST: 7 U/L — ABNORMAL LOW (ref 15–41)
Albumin: 3.5 g/dL (ref 3.5–5.0)
Alkaline Phosphatase: 135 U/L — ABNORMAL HIGH (ref 38–126)
Anion gap: 16 — ABNORMAL HIGH (ref 5–15)
BUN: 27 mg/dL — ABNORMAL HIGH (ref 6–20)
CO2: 27 mmol/L (ref 22–32)
Calcium: 8.9 mg/dL (ref 8.9–10.3)
Chloride: 92 mmol/L — ABNORMAL LOW (ref 98–111)
Creatinine, Ser: 9.9 mg/dL — ABNORMAL HIGH (ref 0.61–1.24)
GFR, Estimated: 6 mL/min — ABNORMAL LOW (ref >60)
Glucose, Bld: 95 mg/dL (ref 70–99)
Potassium: 4.7 mmol/L (ref 3.5–5.1)
Sodium: 135 mmol/L (ref 135–145)
Total Bilirubin: 1.2 mg/dL (ref 0.3–1.2)
Total Protein: 8 g/dL (ref 6.5–8.1)

## 2020-04-25 LAB — RENAL FUNCTION PANEL
Albumin: 3.4 g/dL — ABNORMAL LOW (ref 3.5–5.0)
Anion gap: 18 — ABNORMAL HIGH (ref 5–15)
BUN: 38 mg/dL — ABNORMAL HIGH (ref 6–20)
CO2: 25 mmol/L (ref 22–32)
Calcium: 8.7 mg/dL — ABNORMAL LOW (ref 8.9–10.3)
Chloride: 90 mmol/L — ABNORMAL LOW (ref 98–111)
Creatinine, Ser: 11.44 mg/dL — ABNORMAL HIGH (ref 0.61–1.24)
GFR, Estimated: 5 mL/min — ABNORMAL LOW (ref >60)
Glucose, Bld: 91 mg/dL (ref 70–99)
Phosphorus: 5.6 mg/dL — ABNORMAL HIGH (ref 2.5–4.6)
Potassium: 4.7 mmol/L (ref 3.5–5.1)
Sodium: 133 mmol/L — ABNORMAL LOW (ref 135–145)

## 2020-04-25 LAB — RESP PANEL BY RT-PCR (FLU A&B, COVID) ARPGX2
Influenza A by PCR: NEGATIVE
Influenza B by PCR: NEGATIVE
SARS Coronavirus 2 by RT PCR: NEGATIVE

## 2020-04-25 LAB — CBC WITH DIFFERENTIAL/PLATELET
Abs Immature Granulocytes: 0.02 10*3/uL (ref 0.00–0.07)
Basophils Absolute: 0 10*3/uL (ref 0.0–0.1)
Basophils Relative: 0 %
Eosinophils Absolute: 0 10*3/uL (ref 0.0–0.5)
Eosinophils Relative: 1 %
HCT: 31.9 % — ABNORMAL LOW (ref 39.0–52.0)
Hemoglobin: 10.4 g/dL — ABNORMAL LOW (ref 13.0–17.0)
Immature Granulocytes: 0 %
Lymphocytes Relative: 10 %
Lymphs Abs: 0.7 10*3/uL (ref 0.7–4.0)
MCH: 30.1 pg (ref 26.0–34.0)
MCHC: 32.6 g/dL (ref 30.0–36.0)
MCV: 92.5 fL (ref 80.0–100.0)
Monocytes Absolute: 0.9 10*3/uL (ref 0.1–1.0)
Monocytes Relative: 13 %
Neutro Abs: 5.1 10*3/uL (ref 1.7–7.7)
Neutrophils Relative %: 76 %
Platelets: 239 10*3/uL (ref 150–400)
RBC: 3.45 MIL/uL — ABNORMAL LOW (ref 4.22–5.81)
RDW: 16.9 % — ABNORMAL HIGH (ref 11.5–15.5)
WBC: 6.7 10*3/uL (ref 4.0–10.5)
nRBC: 0 % (ref 0.0–0.2)

## 2020-04-25 LAB — TROPONIN I (HIGH SENSITIVITY)
Troponin I (High Sensitivity): 8 ng/L (ref <18)
Troponin I (High Sensitivity): 9 ng/L (ref <18)

## 2020-04-25 LAB — BRAIN NATRIURETIC PEPTIDE: B Natriuretic Peptide: 2042.9 pg/mL — ABNORMAL HIGH (ref 0.0–100.0)

## 2020-04-25 MED ORDER — SODIUM CHLORIDE 0.9 % IV SOLN: 100.0000 mL | INTRAVENOUS | Status: DC | PRN

## 2020-04-25 MED ORDER — MIDODRINE HCL 5 MG PO TABS
ORAL_TABLET | ORAL | Status: AC
  Filled 2020-04-25: qty 2

## 2020-04-25 MED ORDER — MIDODRINE HCL 5 MG PO TABS
10.0000 mg | ORAL_TABLET | Freq: Once | ORAL | Status: AC
  Administered 2020-04-25: 10 mg via ORAL

## 2020-04-25 MED ORDER — CHLORHEXIDINE GLUCONATE CLOTH 2 % EX PADS: 6.0000 | MEDICATED_PAD | Freq: Every day | CUTANEOUS | Status: DC

## 2020-04-25 MED ORDER — GABAPENTIN 100 MG PO CAPS: 100.0000 mg | ORAL_CAPSULE | Freq: Three times a day (TID) | ORAL | 0 refills | Status: DC | PRN

## 2020-04-25 MED ORDER — LIDOCAINE HCL (PF) 1 % IJ SOLN: 5.0000 mL | INTRAMUSCULAR | Status: DC | PRN

## 2020-04-25 MED ORDER — PENTAFLUOROPROP-TETRAFLUOROETH EX AERO
1.0000 "application " | INHALATION_SPRAY | CUTANEOUS | Status: DC | PRN
  Filled 2020-04-25: qty 116

## 2020-04-25 MED ORDER — HEPARIN SODIUM (PORCINE) 1000 UNIT/ML DIALYSIS
4000.0000 [IU] | Freq: Once | INTRAMUSCULAR | Status: AC
  Administered 2020-04-25: 4000 [IU] via INTRAVENOUS_CENTRAL
  Filled 2020-04-25: qty 4

## 2020-04-25 MED ORDER — BENZONATATE 100 MG PO CAPS
200.0000 mg | ORAL_CAPSULE | Freq: Once | ORAL | Status: AC
  Administered 2020-04-25: 200 mg via ORAL
  Filled 2020-04-25: qty 2

## 2020-04-25 MED ORDER — HEPARIN SODIUM (PORCINE) 1000 UNIT/ML IJ SOLN
INTRAMUSCULAR | Status: AC
  Filled 2020-04-25: qty 4

## 2020-04-25 MED ORDER — LIDOCAINE-PRILOCAINE 2.5-2.5 % EX CREA
1.0000 "application " | TOPICAL_CREAM | CUTANEOUS | Status: DC | PRN
  Filled 2020-04-25: qty 5

## 2020-04-25 NOTE — ED Provider Notes (Signed)
7:48 PM Patient was transferred back downstairs from dialysis for reassessment for discharge.  I reassessed patient and his oxygen saturations are in the 90s on room air now.  He is feeling better but is still having some coughing.  He is requesting some he also was coughing.  I called nephrology and confirmed that he is stable for discharge home.  They agree.  They do feel he is safe to take gabapentin which the family was requesting a prescription for for this cough.  Nephrology feels this is safe so we will give a prescription for him.  Patient will be discharged for outpatient follow-up and continue outpatient dialysis.     Clinical Impression: 1. Acute congestive heart failure, unspecified heart failure type ([place])   2. ESRD on dialysis ([place])   3. Educated about COVID-19 virus infection     Disposition: Discharge  Condition: Good  I have discussed the results, Dx and Tx plan with the pt(& family if present). He/she/they expressed understanding and agree(s) with the plan. Discharge instructions discussed at great length. Strict return precautions discussed and pt &/or family have verbalized understanding of the instructions. No further questions at time of discharge.    New Prescriptions   GABAPENTIN (NEURONTIN) 100 MG CAPSULE    Take 1 capsule (100 mg total) by mouth 3 (three) times daily as needed (coughing uncontrollably).    Follow Up: [name], [place] [place] [id number] [phone]  Schedule an appointment as soon as possible for a visit    [place] [address] [id number] [place] [phone]  If symptoms worsen     Maxine Fredman, Gwenyth Allegra, MD 04/25/20 [phone]

## 2020-04-25 NOTE — ED Provider Notes (Addendum)
[facility] EMERGENCY DEPARTMENT Provider Note   CSN: [id number] Arrival date & time: 04/24/20  2302     History Chief Complaint  Patient presents with  . Covid Exposure / Body Aches w/ Cough    [name] is a 59 y.o. male with a past medical history of ESRD on dialysis MWF, CHF with an EF of 35 to 40% seen on echo 1 year ago, prior DVT/PE, prior stroke, hypertension presenting to the ED with a chief complaint of chest pain, shortness of breath and cough.  States that he had a recent Covid exposure.  He started developing symptoms about 2 days ago after his last dialysis session.  Reports dyspnea on exertion and intermittent chest pain that is worse with coughing.  Reports a dry cough, vomiting and diarrhea.  Denies any fevers.  He has been vaccinated against Covid.  Denies any hemoptysis.  He noticed for the past few months that he has had bilateral lower extremity edema that will improve with "getting some extra fluid off at dialysis, they've been having to do that lately."  States that he is not on a diuretic and does not make urine.  Denies any bloody stools, abdominal pain.  HPI     Past Medical History:  Diagnosis Date  . Acute lower GI bleeding 06/21/2018  . Acute respiratory failure with hypoxia ([place]) 01/13/2015  . Atrial flutter ([place])   . Bile leak, postoperative 03/15/2016  . Biliary dyskinesia 02/12/2016  . 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 Cardiomyopathy   . CHF (congestive heart failure) ([place])   . Coronary artery disease    normal coronaries by 10/10/08 cath, Cardiac Cath 08-04-12 epic.Dr. Doylene Canard follows  . Diabetes mellitus    pt. states he's borderline diabetic., no longer taking med,- off med. since 2013  . Dialysis patient [facility])    Mon-Wed-Fri([place])- Left AV fistula  . Diverticulitis November 2016   reoccurred in December 2016  . Diverticulitis of intestine without perforation or abscess without  bleeding 04/21/2015  . DVT (deep venous thrombosis) ([place])   . ESRD (end stage renal disease) ([place])   . GERD (gastroesophageal reflux disease)   . Gout   . Hemorrhage of left kidney 01/13/2018  . History of nephrectomy 07/04/2012   History of right nephrectomy in 2002 for renal cell carcinoma   . History of unilateral nephrectomy   . Hypertension   . Low iron   . Myocardial infarction [facility]) ?2006  . Renal cell carcinoma    dialysis- MWF- [address]- Dr. Mercy Moore follows.  . Renal insufficiency   . Shortness of breath 05/19/11   "at rest, lying down, w/exertion"  . Stroke [facility]) 02/2011   05/19/11 denies residual  . Umbilical hernia 70/96/28   unrepaired  . Wears glasses     Patient Active Problem List   Diagnosis Date Noted  . Hypotension of hemodialysis 01/18/2019  . Dizziness 11/22/2018  . Hypertensive heart and CKD, ESRD on dialysis, w CHF ([place]) 11/09/2018  . 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 Supratherapeutic INR 06/20/2018  . Melena 06/20/2018  . Malnutrition of moderate degree 02/10/2018  . Anemia 01/27/2018  . Paroxysmal atrial fibrillation ([place]) 01/26/2018  . Depression 01/26/2018  . GERD (gastroesophageal reflux disease) 01/26/2018  . Pulmonary embolism ([place]) 01/26/2018  . 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  . Acute on chronic combined systolic and diastolic CHF, NYHA class 4 ([place]) 07/17/2017  . Hypotension 03/16/2017  .  Paroxysmal atrial flutter ([place]) 03/16/2017  . Chronic pain 03/16/2017  . Leg pain, bilateral 03/16/2017  . Elevated troponin 03/15/2016  . Rib pain on right side   . Atrial flutter with rapid ventricular response ([place]) 06/27/2015  . Right upper quadrant abdominal pain 01/13/2015  . Diverticulitis large intestine w/o perforation or abscess w/o bleeding 12/18/2014  . Hepatic cyst 12/18/2014  . Right sided weakness 10/05/2012  . History of nephrectomy 07/04/2012  . End-stage renal disease on hemodialysis ([place]) 06/04/2011  . Chronic combined systolic  (congestive) and diastolic (congestive) heart failure ([place]) 05/19/2011    Class: Acute  . Hypertension, benign 05/19/2011    Class: Chronic  . DM II (diabetes mellitus, type II), controlled ([place]) 05/19/2011    Class: Chronic  . Gout, chronic 05/19/2011    Past Surgical History:  Procedure Laterality Date  . ANAL FISTULOTOMY  08/15/2018  . AV FISTULA PLACEMENT  03/29/2011   Procedure: ARTERIOVENOUS (AV) FISTULA CREATION;  Surgeon: Hinda Lenis, MD;  Location: [place];  Service: Vascular;  Laterality: Left;  LEFT RADIOCEPHALIC , Arteriovenous HQIONGE([id number])  . CARDIAC CATHETERIZATION  05/21/11  . CARDIAC CATHETERIZATION N/A 03/16/2016   Procedure: Left Heart Cath and Coronary Angiography;  Surgeon: Dixie Dials, MD;  Location: [place] CV LAB;  Service: Cardiovascular;  Laterality: N/A;  . CHOLECYSTECTOMY N/A 02/12/2016   Procedure: LAPAROSCOPIC CHOLECYSTECTOMY WITH INTRAOPERATIVE CHOLANGIOGRAM;  Surgeon: Jackolyn Confer, MD;  Location: [place];  Service: General;  Laterality: N/A;  . COLONOSCOPY WITH PROPOFOL N/A 02/01/2017   Procedure: COLONOSCOPY WITH PROPOFOL;  Surgeon: Juanita Craver, MD;  Location: [facility];  Service: Endoscopy;  Laterality: N/A;  . dialysis cath placed    . ESOPHAGOGASTRODUODENOSCOPY (EGD) WITH PROPOFOL N/A 12/02/2015   Procedure: ESOPHAGOGASTRODUODENOSCOPY (EGD) WITH PROPOFOL;  Surgeon: Juanita Craver, MD;  [facility];  Service: Endoscopy;  Laterality: N/A;  . EVALUATION UNDER ANESTHESIA WITH HEMORRHOIDECTOMY N/A 08/15/2018   Procedure: HEMORRHOID LIGATION/PEXY;  Surgeon: Michael Boston, MD;  Location: [place];  Service: General;  Laterality: N/A;  . FINGER SURGERY     L pinkie finger- ORIF- /w remaining hardware - 1990's    . FISTULOTOMY N/A 08/15/2018   Procedure: SUPERFICIAL ANAL FISTULOTOMY;  Surgeon: Michael Boston, MD;  Location: [place];  Service: General;  Laterality: N/A;  . HEMORRHOID SURGERY    . HERNIA REPAIR N/A 1/32/4401   Umbilical hernia  repair  . INSERTION OF DIALYSIS CATHETER N/A 01/27/2016   Procedure: INSERTION OF DIALYSIS CATHETER;  Surgeon: Waynetta Sandy, MD;  Location: [place];  Service: Vascular;  Laterality: N/A;  . INSERTION OF MESH N/A 07/04/2012   Procedure: INSERTION OF MESH;  Surgeon: Madilyn Hook, DO;  Location: [place];  Service: General;  Laterality: N/A;  . IR EMBO ART  VEN HEMORR LYMPH EXTRAV  INC GUIDE ROADMAPPING  01/13/2018  . IR FLUORO GUIDE CV LINE RIGHT  01/13/2018  . IR IVC FILTER PLMT / S&I /IMG GUID/MOD SED  01/27/2018  . IR RENAL SELECTIVE  UNI INC S&I MOD SED  01/13/2018  . IR US GUIDE VASC ACCESS RIGHT  01/13/2018  . KIDNEY CYST REMOVAL  2019  . LAPAROSCOPIC LYSIS OF ADHESIONS N/A 02/12/2016   Procedure: LAPAROSCOPIC LYSIS OF ADHESIONS;  Surgeon: Jackolyn Confer, MD;  Location: [place];  Service: General;  Laterality: N/A;  . LEFT AND RIGHT HEART CATHETERIZATION WITH CORONARY ANGIOGRAM N/A 08/04/2012   Procedure: LEFT AND RIGHT HEART CATHETERIZATION WITH CORONARY ANGIOGRAM;  Surgeon: Birdie Riddle, MD;  Location: [facility]  CATH LAB;  Service: Cardiovascular;  Laterality: N/A;  . NEPHRECTOMY  2000   right  . REVISON OF ARTERIOVENOUS FISTULA Left 06/05/2013   Procedure: REVISON OF LEFT RADIOCEPHALIC  ARTERIOVENOUS FISTULA;  Surgeon: Conrad Porterdale, MD;  Location: [place];  Service: Vascular;  Laterality: Left;  . REVISON OF ARTERIOVENOUS FISTULA Left 01/27/2016   Procedure: REVISION OF LEFT UPPER EXTREMITY ARTERIOVENOUS FISTULA;  Surgeon: Waynetta Sandy, MD;  Location: [place];  Service: Vascular;  Laterality: Left;  . REVISON OF ARTERIOVENOUS FISTULA Left 08/03/2016   Procedure: REVISON OF Left arm ARTERIOVENOUS FISTULA;  Surgeon: Elam Dutch, MD;  Location: [place];  Service: Vascular;  Laterality: Left;  . REVISON OF ARTERIOVENOUS FISTULA Left 05/06/2017   Procedure: REVISION PLICATION OF ARTERIOVENOUS FISTULA LEFT ARM;  Surgeon: Rosetta Posner, MD;  Location: [place];  Service: Vascular;  Laterality:  Left;  . REVISON OF ARTERIOVENOUS FISTULA Left 02/01/2019   Procedure: REVISION OF ARTERIOVENOUS FISTULA LEFT ARM;  Surgeon: [name], MD;  Location: [place];  Service: Vascular;  Laterality: Left;  . REVISON OF ARTERIOVENOUS FISTULA Left 03/15/2019   Procedure: REVISION PLICATION OF ARTERIOVENOUS FISTULA LEFT ARM;  Surgeon: [name], MD;  Location: [place];  Service: Vascular;  Laterality: Left;  . RIGHT HEART CATHETERIZATION N/A 05/21/2011   Procedure: RIGHT HEART CATH;  Surgeon: Birdie Riddle, MD;  Location: [facility] CATH LAB;  Service: Cardiovascular;  Laterality: N/A;  . smashed  1990's   "left pinky; have a plate in there"  . UMBILICAL HERNIA REPAIR N/A 07/04/2012   Procedure: LAPAROSCOPIC UMBILICAL HERNIA;  Surgeon: Madilyn Hook, DO;  Location: [place];  Service: General;  Laterality: N/A;  . US ECHOCARDIOGRAPHY  05/20/11       Family History  Problem Relation Age of Onset  . Hypertension Mother   . Kidney disease Mother     Social History   Tobacco Use  . Smoking status: Never Smoker  . Smokeless tobacco: Never Used  Vaping Use  . Vaping Use: Never used  Substance Use Topics  . Alcohol use: No    Alcohol/week: 0.0 standard drinks  . Drug use: No    Types: Cocaine    Comment: Past hx-none in 20 yrs    Home Medications Prior to Admission medications   Medication Sig Start Date End Date Taking? Authorizing Provider  albuterol (VENTOLIN HFA) 108 (90 Base) MCG/ACT inhaler Inhale 2 puffs into the lungs every 6 (six) hours as needed for wheezing or shortness of breath. 09/18/19  Yes [name], FNP  allopurinol (ZYLOPRIM) 100 MG tablet Take 1 tablet (100 mg total) by mouth daily. 11/24/18  Yes Dixie Dials, MD  amiodarone (PACERONE) 200 MG tablet Take 200 mg by mouth daily.  07/05/15  Yes [provider]  calcium acetate (PHOSLO) 667 MG capsule Take 1-3 capsules (667-2,001 mg total) by mouth See admin instructions. Take 2001 mg capsules with meals three  times daily and 667 mg capsule with snacks. Patient taking differently: Take 2,001 mg by mouth daily.  11/23/18  Yes Dixie Dials, MD  chlorhexidine (PERIDEX) 0.12 % solution 15 mLs by Mouth Rinse route in the morning and at bedtime.  05/29/18  Yes [provider]  cinacalcet (SENSIPAR) 30 MG tablet Take 30 mg by mouth daily.  09/03/19  Yes [provider]  diclofenac Sodium (VOLTAREN) 1 % GEL Apply 2 g topically 4 (four) times daily. 11/15/19  Yes [name], FNP  ferric citrate Lorin Picket) 1  GM 210 MG(Fe) tablet Take 210 mg by mouth 3 (three) times daily with meals.    Yes [provider]  fexofenadine-pseudoephedrine (ALLEGRA-D) 60-120 MG 12 hr tablet Take 1 tablet by mouth every 12 (twelve) hours. 09/12/19  Yes Carmin Muskrat, MD  ipratropium (ATROVENT) 0.06 % nasal spray Place 2 sprays into both nostrils 4 (four) times daily. 11/15/19  Yes [name], FNP  lidocaine-prilocaine (EMLA) cream Apply 1 application topically See admin instructions. Apply topically to port access prior to dialysis - Monday, Wednesday, Friday. 04/08/15  Yes [provider]  Methoxy PEG-Epoetin Beta (MIRCERA IJ) Mircera 12/03/19 04/06/21 Yes [provider]  midodrine (PROAMATINE) 10 MG tablet Take 10 mg by mouth 3 (three) times daily.   Yes [provider]  multivitamin (RENA-VIT) TABS tablet Take 1 tablet by mouth at bedtime.   Yes [provider]  nitroGLYCERIN (NITROSTAT) 0.4 MG SL tablet Place 0.4 mg under the tongue every 5 (five) minutes as needed for chest pain.  05/20/15  Yes [provider]  ondansetron (ZOFRAN ODT) 4 MG disintegrating tablet Take 1 tablet (4 mg total) by mouth every 8 (eight) hours as needed for nausea or vomiting. 09/12/19  Yes Carmin Muskrat, MD  pantoprazole (PROTONIX) 40 MG tablet Take 40 mg by mouth 2 (two) times daily.   Yes [provider]  sucroferric oxyhydroxide (VELPHORO) 500 MG chewable tablet Chew 500  mg by mouth daily.  10/02/18  Yes [provider]  warfarin (COUMADIN) 2.5 MG tablet Take 3 tablets (7.5 mg total) by mouth daily at 6 PM. Patient taking differently: Take 2.5 mg by mouth daily.  11/23/18  Yes Dixie Dials, MD  amoxicillin-clavulanate (AUGMENTIN) 250-125 MG tablet Take 1 tablet by mouth 3 (three) times daily. Patient not taking: Reported on 04/25/2020 02/06/20   [name], FNP  HYDROcodone-homatropine (HYDROMET) 5-1.5 MG/5ML syrup Take 5 mLs by mouth 2 (two) times daily as needed. Patient not taking: Reported on 11/14/2019 10/31/19   [name], FNP  Lidocaine, Anorectal, (L-M-X 5) 5 % CREA Apply 1 application topically 4 (four) times daily as needed (apply to area up to 6 times daily as needed). Patient not taking: Reported on 04/25/2020 07/09/18   Gareth Morgan, MD  Nutritional Supplements (FEEDING SUPPLEMENT, NEPRO CARB STEADY,) LIQD Take 237 mLs by mouth daily.  Patient not taking: Reported on 04/25/2020 03/22/16   [provider]  UNABLE TO FIND Out patient physical therapy- vestibular therapy for BPPV 08/01/18   Debbe Odea, MD    Allergies    Ace inhibitors  Review of Systems   Review of Systems  Constitutional: Negative for appetite change, chills and fever.  HENT: Negative for ear pain, rhinorrhea, sneezing and sore throat.   Eyes: Negative for photophobia and visual disturbance.  Respiratory: Positive for cough and shortness of breath. Negative for chest tightness and wheezing.   Cardiovascular: Positive for chest pain and leg swelling. Negative for palpitations.  Gastrointestinal: Positive for diarrhea, nausea and vomiting. Negative for abdominal pain, blood in stool and constipation.  Genitourinary: Negative for dysuria, hematuria and urgency.  Musculoskeletal: Negative for myalgias.  Skin: Negative for rash.  Neurological: Negative for dizziness, weakness and light-headedness.    Physical Exam Updated Vital Signs BP 116/77   Pulse 99    Temp 99.7 F (37.6 C) (Oral)   Resp (!) 28   Ht 6\' 1"  (1.854 m)   Wt 125 kg   SpO2 98%   BMI 36.36 kg/m   Physical Exam  Vitals and nursing note reviewed.  Constitutional:      General: He is not in acute distress.    Appearance: He is well-developed. He is obese.  HENT:     Head: Normocephalic and atraumatic.     Nose: Nose normal.  Eyes:     General: No scleral icterus.       Right eye: No discharge.        Left eye: No discharge.     Conjunctiva/sclera: Conjunctivae normal.  Cardiovascular:     Rate and Rhythm: Regular rhythm. Tachycardia present.     Heart sounds: Normal heart sounds. No murmur heard.  No friction rub. No gallop.   Pulmonary:     Effort: Pulmonary effort is normal. No respiratory distress.     Breath sounds: Normal breath sounds.  Abdominal:     General: Bowel sounds are normal. There is no distension.     Palpations: Abdomen is soft.     Tenderness: There is no abdominal tenderness. There is no guarding.  Musculoskeletal:        General: Normal range of motion.     Cervical back: Normal range of motion and neck supple.     Right lower leg: Edema present.     Left lower leg: Edema present.  Skin:    General: Skin is warm and dry.     Findings: No rash.  Neurological:     Mental Status: He is alert.     Motor: No abnormal muscle tone.     Coordination: Coordination normal.     ED Results / Procedures / Treatments   Labs (all labs ordered are listed, but only abnormal results are displayed) Labs Reviewed  CBC WITH DIFFERENTIAL/PLATELET - Abnormal; Notable for the following components:      Result Value   RBC 3.45 (*)    Hemoglobin 10.4 (*)    HCT 31.9 (*)    RDW 16.9 (*)    All other components within normal limits  COMPREHENSIVE METABOLIC PANEL - Abnormal; Notable for the following components:   Chloride 92 (*)    BUN 27 (*)    Creatinine, Ser 9.90 (*)    AST 7 (*)    Alkaline Phosphatase 135 (*)    GFR, Estimated 6 (*)    Anion  gap 16 (*)    All other components within normal limits  BRAIN NATRIURETIC PEPTIDE - Abnormal; Notable for the following components:   B Natriuretic Peptide 2,042.9 (*)    All other components within normal limits  RESP PANEL BY RT-PCR (FLU A&B, COVID) ARPGX2  TROPONIN I (HIGH SENSITIVITY)  TROPONIN I (HIGH SENSITIVITY)    EKG EKG Interpretation  Date/Time:  Friday April 25 2020 07:05:21 EST Ventricular Rate:  102 PR Interval:    QRS Duration: 113 QT Interval:  381 QTC Calculation: 497 R Axis:   -42 Text Interpretation: Sinus tachycardia Borderline IVCD with LAD Abnormal T, consider ischemia, lateral leads Confirmed by Dene Gentry [phone]) on 04/25/2020 7:22:02 AM   Radiology DG Chest Portable 1 View  Result Date: 04/24/2020 CLINICAL DATA:  Cough, COVID exposure EXAM: PORTABLE CHEST 1 VIEW COMPARISON:  11/01/2019 FINDINGS: The lungs are symmetrically well expanded. Minimal left basilar atelectasis. No pneumothorax or pleural effusion. Cardiac size is mildly enlarged and has progressively enlarged since prior examination. There is mild pulmonary vascular redistribution to the lung apices without overt pulmonary edema identified. No acute bone abnormality. IMPRESSION: Mild progressive cardiomegaly. Interval development of pulmonary vascular redistribution of  the lung apices suggesting mild volume overload or early cardiogenic failure. No overt pulmonary edema. Electronically Signed   By: Fidela Salisbury MD   On: 04/24/2020 23:36    Procedures Procedures (including critical care time)  Medications Ordered in ED Medications - No data to display  ED Course  I have reviewed the triage vital signs and the nursing notes.  Pertinent labs & imaging results that were available during my care of the patient were reviewed by me and considered in my medical decision making (see chart for details).    MDM Rules/Calculators/A&P                          [name] was evaluated in  Emergency Department on 04/25/20   for the symptoms described in the history of present illness. He/she was evaluated in the context of the global COVID-19 pandemic, which necessitated consideration that the patient might be at risk for infection with the SARS-CoV-2 virus that causes COVID-19. Institutional protocols and algorithms that pertain to the evaluation of patients at risk for COVID-19 are in a state of rapid change based on information released by regulatory bodies including the CDC and federal and state organizations. These policies and algorithms were followed during the patient's care in the ED.  59 year old male with past medical history of ESRD on dialysis MWF, CHF with an EF of 35 to 40% seen on echo a year ago, prior DVT/PE currently on Coumadin, prior stroke and hypertension presenting to the ED with a chief complaint of chest pain, shortness of breath and cough.  Recent Covid exposure.  2 days ago had his last dialysis session and started having the above-mentioned symptoms.  Chest pain is worse with coughing.  Reports bilateral lower extremity edema that has gradually worsened over the past few months and has required extra time in dialysis for fluid removal.  He is not on a diuretic and does not make urine.  Denies any abdominal pain or bloody stools.  On exam patient is overall well-appearing.  He is tachycardic and tachypneic but this improves with rest.  He does have some bilateral lower extremity edema that is symmetric.  No calf tenderness noted bilaterally.  Abdomen is soft, nontender nondistended.  He does not have any chest tenderness and states that it worsens when he coughs.  He was seen and evaluated at [facility] ED yesterday and was told that he had "a chest cold and they gave me something for my cough."  He denies any hemoptysis, fever, bloody stools.  Work-up here shows EKG sinus tachycardia, no STEMI.  Chest x-ray shows findings consistent with mild volume overload state.   No edema noted.  BNP elevated to over 2000 which is higher than his usual.  CMP and CBC unremarkable. Initial and delta troponin both negative. COVID test today and last night at outside ER is negative.  Flu test are negative as well. Suspect this could be due to an acute episode of CHF. Will have him complete his dialysis session today as regularly scheduled and have them pull more fluid off to help with his fluid overload state. May also have some other viral URI causing worsening of his symptoms.  We will have him follow-up with PCP and return for worsening symptoms.  7:52 AM Patient states he contacted his dialysis facility but they are unable to get him in for dialysis session today.  I will attempt to contact the renal navigator  to assist in scheduling his dialysis session as it is important that he get this done today. All imaging, if done today, including plain films, CT scans, and ultrasounds, independently reviewed by me, and interpretations confirmed via formal radiology reads.  8:34 AM Spoke to Terri Piedra, renal navigator regarding patient's need for dialysis today.  She will call the facility and try to arrange for this.  9:29 AM Patient will have his dialysis session completed in the hospital and then will return to the ER prior to discharge.  2:39 PM  Will sign out to oncoming provider. Likely discharge after dialysis to follow up outpatient with PCP.   Portions of this note were generated with [employer]. Dictation errors may occur despite best attempts at proofreading.  Final Clinical Impression(s) / ED Diagnoses Final diagnoses:  Acute congestive heart failure, unspecified heart failure type ([place])  ESRD on dialysis [facility])  Educated about COVID-19 virus infection    Rx / DC Orders ED Discharge Orders    None           Delia Heady, PA-C 04/25/20 1440    Valarie Merino, MD 04/27/20 [phone]

## 2020-04-25 NOTE — Progress Notes (Signed)
Renal Navigator received call from [place] who states patient is cleared for discharge from ED, but has missed his first shift HD appointment this morning. Navigator [facility] and spoke with Agricultural consultant, who states she unfortunately cannot accommodate him on second shift today, but can offer a 6:00am tomorrow to make up today's treatment. Per EDP, patient is symptomatic and needs HD today. Navigator contacted Nephrology team who will write orders for HD in acute unit today and then patient can return to ED for discharge if stable after HD.  Navigator informed EDP/[place] and updated [facility] that patient will not need appointment tomorrow and should be back to his regularly scheduled seat on Monday.  Alphonzo Cruise, [place] Renal Navigator [phone]

## 2020-04-25 NOTE — ED Notes (Signed)
Pt informs this nurse he is unable to get dialysis today at his facility because they are unable to take him today. This nurse informed EDP.

## 2020-04-25 NOTE — Discharge Instructions (Addendum)
Follow-up with your primary care provider. Your Covid test was negative today.  You may still have Covid and may need to be retested based on the time of exposure. Continue your other home medications including the cough medication prescribed to you yesterday. Return to the ER if you start to experience worsening chest pain, shortness of breath, vomiting or coughing up blood, lightheadedness. Continue your dialysis sessions as regularly scheduled.   After our discussion returning to the emergency department, the nephrology team feels you are safe for discharge home and agreed that it was safe for you to get a low-dose prescription for gabapentin to take up to 3 times a day for coughing.  They feel it is safe for you.  Please use this if you need but otherwise the fluid removal should help with the coughing and symptoms.  Please follow-up with your outpatient team.  If any symptoms change or worsen, return to the nearest emergency department.

## 2020-04-25 NOTE — ED Notes (Signed)
Pt ambulated well to the RR

## 2020-04-25 NOTE — Procedures (Signed)
Asked to see patient for HD.  Came to ED for SOB, was not stalbe for OP HD. In ED labs okay but CXR showed pulm edema, mild.  Pt not in distress. Has 1/3 rales bilat on exam, 1+pretib edema.  Plan is for ED HD upstairs and then will return to ED for reassessment prior to dc home. Pt says he can tolerate 5 L , will ^ UF goal.  Will give midodrine 10mg  as well bases on what he says he gets w/ OP HD.    I was present at this dialysis session, have reviewed the session itself and made  appropriate changes Kelly Splinter MD [place] pager [phone]   04/25/2020, 3:14 PM

## 2020-04-28 DIAGNOSIS — D631 Anemia in chronic kidney disease: Secondary | ICD-10-CM | POA: Diagnosis not present

## 2020-04-28 DIAGNOSIS — N186 End stage renal disease: Secondary | ICD-10-CM | POA: Diagnosis not present

## 2020-04-28 DIAGNOSIS — D509 Iron deficiency anemia, unspecified: Secondary | ICD-10-CM | POA: Diagnosis not present

## 2020-04-28 DIAGNOSIS — Z992 Dependence on renal dialysis: Secondary | ICD-10-CM | POA: Diagnosis not present

## 2020-04-28 DIAGNOSIS — N2581 Secondary hyperparathyroidism of renal origin: Secondary | ICD-10-CM | POA: Diagnosis not present

## 2020-04-30 DIAGNOSIS — N2581 Secondary hyperparathyroidism of renal origin: Secondary | ICD-10-CM | POA: Diagnosis not present

## 2020-04-30 DIAGNOSIS — D509 Iron deficiency anemia, unspecified: Secondary | ICD-10-CM | POA: Diagnosis not present

## 2020-04-30 DIAGNOSIS — D689 Coagulation defect, unspecified: Secondary | ICD-10-CM | POA: Diagnosis not present

## 2020-04-30 DIAGNOSIS — Z992 Dependence on renal dialysis: Secondary | ICD-10-CM | POA: Diagnosis not present

## 2020-04-30 DIAGNOSIS — D631 Anemia in chronic kidney disease: Secondary | ICD-10-CM | POA: Diagnosis not present

## 2020-04-30 DIAGNOSIS — N186 End stage renal disease: Secondary | ICD-10-CM | POA: Diagnosis not present

## 2020-05-02 DIAGNOSIS — N2581 Secondary hyperparathyroidism of renal origin: Secondary | ICD-10-CM | POA: Diagnosis not present

## 2020-05-02 DIAGNOSIS — N186 End stage renal disease: Secondary | ICD-10-CM | POA: Diagnosis not present

## 2020-05-02 DIAGNOSIS — D509 Iron deficiency anemia, unspecified: Secondary | ICD-10-CM | POA: Diagnosis not present

## 2020-05-02 DIAGNOSIS — D631 Anemia in chronic kidney disease: Secondary | ICD-10-CM | POA: Diagnosis not present

## 2020-05-02 DIAGNOSIS — Z992 Dependence on renal dialysis: Secondary | ICD-10-CM | POA: Diagnosis not present

## 2020-05-05 ENCOUNTER — Encounter: Payer: Self-pay | Admitting: Nurse Practitioner

## 2020-05-05 DIAGNOSIS — D631 Anemia in chronic kidney disease: Secondary | ICD-10-CM | POA: Diagnosis not present

## 2020-05-05 DIAGNOSIS — Z992 Dependence on renal dialysis: Secondary | ICD-10-CM | POA: Diagnosis not present

## 2020-05-05 DIAGNOSIS — D509 Iron deficiency anemia, unspecified: Secondary | ICD-10-CM | POA: Diagnosis not present

## 2020-05-05 DIAGNOSIS — N186 End stage renal disease: Secondary | ICD-10-CM | POA: Diagnosis not present

## 2020-05-05 DIAGNOSIS — N2581 Secondary hyperparathyroidism of renal origin: Secondary | ICD-10-CM | POA: Diagnosis not present

## 2020-05-07 DIAGNOSIS — D509 Iron deficiency anemia, unspecified: Secondary | ICD-10-CM | POA: Diagnosis not present

## 2020-05-07 DIAGNOSIS — N2581 Secondary hyperparathyroidism of renal origin: Secondary | ICD-10-CM | POA: Diagnosis not present

## 2020-05-07 DIAGNOSIS — D631 Anemia in chronic kidney disease: Secondary | ICD-10-CM | POA: Diagnosis not present

## 2020-05-07 DIAGNOSIS — Z992 Dependence on renal dialysis: Secondary | ICD-10-CM | POA: Diagnosis not present

## 2020-05-07 DIAGNOSIS — D689 Coagulation defect, unspecified: Secondary | ICD-10-CM | POA: Diagnosis not present

## 2020-05-07 DIAGNOSIS — N186 End stage renal disease: Secondary | ICD-10-CM | POA: Diagnosis not present

## 2020-05-07 LAB — POCT INR: INR: 1.5 — AB (ref 0.9–1.1)

## 2020-05-09 DIAGNOSIS — Z992 Dependence on renal dialysis: Secondary | ICD-10-CM | POA: Diagnosis not present

## 2020-05-09 DIAGNOSIS — N2581 Secondary hyperparathyroidism of renal origin: Secondary | ICD-10-CM | POA: Diagnosis not present

## 2020-05-09 DIAGNOSIS — D631 Anemia in chronic kidney disease: Secondary | ICD-10-CM | POA: Diagnosis not present

## 2020-05-09 DIAGNOSIS — D509 Iron deficiency anemia, unspecified: Secondary | ICD-10-CM | POA: Diagnosis not present

## 2020-05-09 DIAGNOSIS — N186 End stage renal disease: Secondary | ICD-10-CM | POA: Diagnosis not present

## 2020-05-12 ENCOUNTER — Encounter: Payer: Self-pay | Admitting: Nurse Practitioner

## 2020-05-12 DIAGNOSIS — D509 Iron deficiency anemia, unspecified: Secondary | ICD-10-CM | POA: Diagnosis not present

## 2020-05-12 DIAGNOSIS — Z992 Dependence on renal dialysis: Secondary | ICD-10-CM | POA: Diagnosis not present

## 2020-05-12 DIAGNOSIS — N2581 Secondary hyperparathyroidism of renal origin: Secondary | ICD-10-CM | POA: Diagnosis not present

## 2020-05-12 DIAGNOSIS — N186 End stage renal disease: Secondary | ICD-10-CM | POA: Diagnosis not present

## 2020-05-12 DIAGNOSIS — D631 Anemia in chronic kidney disease: Secondary | ICD-10-CM | POA: Diagnosis not present

## 2020-05-14 DIAGNOSIS — Z992 Dependence on renal dialysis: Secondary | ICD-10-CM | POA: Diagnosis not present

## 2020-05-14 DIAGNOSIS — N2581 Secondary hyperparathyroidism of renal origin: Secondary | ICD-10-CM | POA: Diagnosis not present

## 2020-05-14 DIAGNOSIS — D509 Iron deficiency anemia, unspecified: Secondary | ICD-10-CM | POA: Diagnosis not present

## 2020-05-14 DIAGNOSIS — N186 End stage renal disease: Secondary | ICD-10-CM | POA: Diagnosis not present

## 2020-05-14 DIAGNOSIS — D631 Anemia in chronic kidney disease: Secondary | ICD-10-CM | POA: Diagnosis not present

## 2020-05-16 DIAGNOSIS — D509 Iron deficiency anemia, unspecified: Secondary | ICD-10-CM | POA: Diagnosis not present

## 2020-05-16 DIAGNOSIS — N2581 Secondary hyperparathyroidism of renal origin: Secondary | ICD-10-CM | POA: Diagnosis not present

## 2020-05-16 DIAGNOSIS — N186 End stage renal disease: Secondary | ICD-10-CM | POA: Diagnosis not present

## 2020-05-16 DIAGNOSIS — D631 Anemia in chronic kidney disease: Secondary | ICD-10-CM | POA: Diagnosis not present

## 2020-05-16 DIAGNOSIS — Z992 Dependence on renal dialysis: Secondary | ICD-10-CM | POA: Diagnosis not present

## 2020-05-19 DIAGNOSIS — N186 End stage renal disease: Secondary | ICD-10-CM | POA: Diagnosis not present

## 2020-05-19 DIAGNOSIS — D509 Iron deficiency anemia, unspecified: Secondary | ICD-10-CM | POA: Diagnosis not present

## 2020-05-19 DIAGNOSIS — N2581 Secondary hyperparathyroidism of renal origin: Secondary | ICD-10-CM | POA: Diagnosis not present

## 2020-05-19 DIAGNOSIS — Z992 Dependence on renal dialysis: Secondary | ICD-10-CM | POA: Diagnosis not present

## 2020-05-19 DIAGNOSIS — D631 Anemia in chronic kidney disease: Secondary | ICD-10-CM | POA: Diagnosis not present

## 2020-05-20 DIAGNOSIS — I2510 Atherosclerotic heart disease of native coronary artery without angina pectoris: Secondary | ICD-10-CM | POA: Diagnosis not present

## 2020-05-20 DIAGNOSIS — Z7901 Long term (current) use of anticoagulants: Secondary | ICD-10-CM | POA: Diagnosis not present

## 2020-05-20 DIAGNOSIS — I5022 Chronic systolic (congestive) heart failure: Secondary | ICD-10-CM | POA: Diagnosis not present

## 2020-05-20 DIAGNOSIS — N186 End stage renal disease: Secondary | ICD-10-CM | POA: Diagnosis not present

## 2020-05-21 DIAGNOSIS — D689 Coagulation defect, unspecified: Secondary | ICD-10-CM | POA: Diagnosis not present

## 2020-05-21 DIAGNOSIS — D509 Iron deficiency anemia, unspecified: Secondary | ICD-10-CM | POA: Diagnosis not present

## 2020-05-21 DIAGNOSIS — Z992 Dependence on renal dialysis: Secondary | ICD-10-CM | POA: Diagnosis not present

## 2020-05-21 DIAGNOSIS — N186 End stage renal disease: Secondary | ICD-10-CM | POA: Diagnosis not present

## 2020-05-21 DIAGNOSIS — N2581 Secondary hyperparathyroidism of renal origin: Secondary | ICD-10-CM | POA: Diagnosis not present

## 2020-05-21 DIAGNOSIS — D631 Anemia in chronic kidney disease: Secondary | ICD-10-CM | POA: Diagnosis not present

## 2020-05-21 LAB — POCT INR: INR: 1.3 — AB (ref <=1.1)

## 2020-05-23 DIAGNOSIS — D631 Anemia in chronic kidney disease: Secondary | ICD-10-CM | POA: Diagnosis not present

## 2020-05-23 DIAGNOSIS — N186 End stage renal disease: Secondary | ICD-10-CM | POA: Diagnosis not present

## 2020-05-23 DIAGNOSIS — D509 Iron deficiency anemia, unspecified: Secondary | ICD-10-CM | POA: Diagnosis not present

## 2020-05-23 DIAGNOSIS — Z992 Dependence on renal dialysis: Secondary | ICD-10-CM | POA: Diagnosis not present

## 2020-05-23 DIAGNOSIS — N2581 Secondary hyperparathyroidism of renal origin: Secondary | ICD-10-CM | POA: Diagnosis not present

## 2020-05-24 DIAGNOSIS — Z992 Dependence on renal dialysis: Secondary | ICD-10-CM | POA: Diagnosis not present

## 2020-05-24 DIAGNOSIS — N186 End stage renal disease: Secondary | ICD-10-CM | POA: Diagnosis not present

## 2020-05-24 DIAGNOSIS — I120 Hypertensive chronic kidney disease with stage 5 chronic kidney disease or end stage renal disease: Secondary | ICD-10-CM | POA: Diagnosis not present

## 2020-05-26 ENCOUNTER — Encounter: Payer: Self-pay | Admitting: Nurse Practitioner

## 2020-05-26 DIAGNOSIS — N2581 Secondary hyperparathyroidism of renal origin: Secondary | ICD-10-CM | POA: Diagnosis not present

## 2020-05-26 DIAGNOSIS — Z992 Dependence on renal dialysis: Secondary | ICD-10-CM | POA: Diagnosis not present

## 2020-05-26 DIAGNOSIS — D509 Iron deficiency anemia, unspecified: Secondary | ICD-10-CM | POA: Diagnosis not present

## 2020-05-26 DIAGNOSIS — N186 End stage renal disease: Secondary | ICD-10-CM | POA: Diagnosis not present

## 2020-05-26 DIAGNOSIS — E1129 Type 2 diabetes mellitus with other diabetic kidney complication: Secondary | ICD-10-CM | POA: Diagnosis not present

## 2020-05-26 DIAGNOSIS — D631 Anemia in chronic kidney disease: Secondary | ICD-10-CM | POA: Diagnosis not present

## 2020-05-28 DIAGNOSIS — N2581 Secondary hyperparathyroidism of renal origin: Secondary | ICD-10-CM | POA: Diagnosis not present

## 2020-05-28 DIAGNOSIS — E1129 Type 2 diabetes mellitus with other diabetic kidney complication: Secondary | ICD-10-CM | POA: Diagnosis not present

## 2020-05-28 DIAGNOSIS — I4892 Unspecified atrial flutter: Secondary | ICD-10-CM | POA: Diagnosis not present

## 2020-05-28 DIAGNOSIS — Z992 Dependence on renal dialysis: Secondary | ICD-10-CM | POA: Diagnosis not present

## 2020-05-28 DIAGNOSIS — N186 End stage renal disease: Secondary | ICD-10-CM | POA: Diagnosis not present

## 2020-05-28 DIAGNOSIS — D509 Iron deficiency anemia, unspecified: Secondary | ICD-10-CM | POA: Diagnosis not present

## 2020-05-28 DIAGNOSIS — D631 Anemia in chronic kidney disease: Secondary | ICD-10-CM | POA: Diagnosis not present

## 2020-05-28 LAB — POCT INR: INR: 2.2 — AB (ref <=1.1)

## 2020-05-30 DIAGNOSIS — D631 Anemia in chronic kidney disease: Secondary | ICD-10-CM | POA: Diagnosis not present

## 2020-05-30 DIAGNOSIS — D509 Iron deficiency anemia, unspecified: Secondary | ICD-10-CM | POA: Diagnosis not present

## 2020-05-30 DIAGNOSIS — N186 End stage renal disease: Secondary | ICD-10-CM | POA: Diagnosis not present

## 2020-05-30 DIAGNOSIS — N2581 Secondary hyperparathyroidism of renal origin: Secondary | ICD-10-CM | POA: Diagnosis not present

## 2020-05-30 DIAGNOSIS — E1129 Type 2 diabetes mellitus with other diabetic kidney complication: Secondary | ICD-10-CM | POA: Diagnosis not present

## 2020-05-30 DIAGNOSIS — Z992 Dependence on renal dialysis: Secondary | ICD-10-CM | POA: Diagnosis not present

## 2020-06-02 ENCOUNTER — Encounter: Payer: Self-pay | Admitting: Nurse Practitioner

## 2020-06-02 DIAGNOSIS — N186 End stage renal disease: Secondary | ICD-10-CM | POA: Diagnosis not present

## 2020-06-02 DIAGNOSIS — E1129 Type 2 diabetes mellitus with other diabetic kidney complication: Secondary | ICD-10-CM | POA: Diagnosis not present

## 2020-06-02 DIAGNOSIS — D509 Iron deficiency anemia, unspecified: Secondary | ICD-10-CM | POA: Diagnosis not present

## 2020-06-02 DIAGNOSIS — D631 Anemia in chronic kidney disease: Secondary | ICD-10-CM | POA: Diagnosis not present

## 2020-06-02 DIAGNOSIS — N2581 Secondary hyperparathyroidism of renal origin: Secondary | ICD-10-CM | POA: Diagnosis not present

## 2020-06-02 DIAGNOSIS — Z992 Dependence on renal dialysis: Secondary | ICD-10-CM | POA: Diagnosis not present

## 2020-06-03 ENCOUNTER — Other Ambulatory Visit: Payer: Self-pay

## 2020-06-03 ENCOUNTER — Telehealth: Payer: Self-pay

## 2020-06-03 ENCOUNTER — Telehealth (INDEPENDENT_AMBULATORY_CARE_PROVIDER_SITE_OTHER): Payer: Medicare Other | Admitting: Nurse Practitioner

## 2020-06-03 VITALS — BP 94/60

## 2020-06-03 DIAGNOSIS — R053 Chronic cough: Secondary | ICD-10-CM

## 2020-06-03 MED ORDER — GABAPENTIN 100 MG PO CAPS: 100.0000 mg | ORAL_CAPSULE | Freq: Three times a day (TID) | ORAL | 1 refills | Status: DC | PRN

## 2020-06-03 MED ORDER — BREO ELLIPTA 100-25 MCG/INH IN AEPB: 1.0000 | INHALATION_SPRAY | Freq: Two times a day (BID) | RESPIRATORY_TRACT | 1 refills | Status: DC

## 2020-06-03 MED ORDER — HYDROCODONE-HOMATROPINE 5-1.5 MG/5ML PO SYRP: 5.0000 mL | ORAL_SOLUTION | Freq: Four times a day (QID) | ORAL | 0 refills | Status: DC | PRN

## 2020-06-03 NOTE — Progress Notes (Unsigned)
Virtual Visit via Mychart   This visit type was conducted due to national recommendations for restrictions regarding the COVID-19 Pandemic (e.g. social distancing) in an effort to limit this patient's exposure and mitigate transmission in our community.  Due to his co-morbid illnesses, this patient is at least at moderate risk for complications without adequate follow up.  This format is felt to be most appropriate for this patient at this time.  All issues noted in this document were discussed and addressed.  A limited physical exam was performed with this format.    This visit type was conducted due to national recommendations for restrictions regarding the COVID-19 Pandemic (e.g. social distancing) in an effort to limit this patient's exposure and mitigate transmission in our community.  Patients identity confirmed using two different identifiers.  This format is felt to be most appropriate for this patient at this time.  All issues noted in this document were discussed and addressed.  No physical exam was performed (except for noted visual exam findings with Video Visits).    Date:  06/04/2020   ID:  ERIQUE KASER, DOB 08/02/1960, MRN [id number]  Patient Location:  Home - spoke with [name]  Provider location:   Office    Chief Complaint:  Cough   History of Present Illness:    KALIEL BOLDS is a 60 y.o. male who presents via video conferencing for a telehealth visit today.    The patient does not have symptoms concerning for COVID-19 infection (fever, chills, cough, or new shortness of breath).   He has persistent cough that has been off and on for the last 6 months. He has been treated with antibiotics and steroids without relief.  He reports the cough keeps him up at night.    His daughter is present during the virtual visit.    Cough This is a chronic problem. The current episode started more than 1 month ago. The cough is non-productive. Associated symptoms include  wheezing. Pertinent negatives include no chest pain, ear congestion, nasal congestion or shortness of breath. The treatment provided no relief. There is no history of asthma.     Past Medical History:  Diagnosis Date  . Acute lower GI bleeding 06/21/2018  . Acute respiratory failure with hypoxia ([place]) 01/13/2015  . Atrial flutter ([place])   . Bile leak, postoperative 03/15/2016  . Biliary dyskinesia 02/12/2016  . 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 Cardiomyopathy   . CHF (congestive heart failure) ([place])   . Coronary artery disease    normal coronaries by 10/10/08 cath, Cardiac Cath 08-04-12 epic.Dr. Doylene Canard follows  . Diabetes mellitus    pt. states he's borderline diabetic., no longer taking med,- off med. since 2013  . Dialysis patient [facility])    Mon-Wed-Fri([place])- Left AV fistula  . Diverticulitis November 2016   reoccurred in December 2016  . Diverticulitis of intestine without perforation or abscess without bleeding 04/21/2015  . DVT (deep venous thrombosis) ([place])   . ESRD (end stage renal disease) ([place])   . GERD (gastroesophageal reflux disease)   . Gout   . Hemorrhage of left kidney 01/13/2018  . History of nephrectomy 07/04/2012   History of right nephrectomy in 2002 for renal cell carcinoma   . History of unilateral nephrectomy   . Hypertension   . Low iron   . Myocardial infarction [facility]) ?2006  . Renal cell carcinoma    dialysis- MWF- [address]- Dr. Mercy Moore follows.  . Renal insufficiency   . Shortness of breath 05/19/11   "at rest, lying down, w/exertion"  . Stroke [facility]) 02/2011   05/19/11 denies residual  . Umbilical hernia 43/32/95   unrepaired  . Wears glasses    Past Surgical History:  Procedure Laterality Date  . ANAL FISTULOTOMY  08/15/2018  . AV FISTULA PLACEMENT  03/29/2011   Procedure: ARTERIOVENOUS (AV) FISTULA CREATION;  Surgeon: Hinda Lenis, MD;  Location: [place];  Service: Vascular;  Laterality: Left;   LEFT RADIOCEPHALIC , Arteriovenous JOACZYS(06301)  . CARDIAC CATHETERIZATION  05/21/11  . CARDIAC CATHETERIZATION N/A 03/16/2016   Procedure: Left Heart Cath and Coronary Angiography;  Surgeon: Dixie Dials, MD;  Location: [place] CV LAB;  Service: Cardiovascular;  Laterality: N/A;  . CHOLECYSTECTOMY N/A 02/12/2016   Procedure: LAPAROSCOPIC CHOLECYSTECTOMY WITH INTRAOPERATIVE CHOLANGIOGRAM;  Surgeon: Jackolyn Confer, MD;  Location: [place];  Service: General;  Laterality: N/A;  . COLONOSCOPY WITH PROPOFOL N/A 02/01/2017   Procedure: COLONOSCOPY WITH PROPOFOL;  Surgeon: Juanita Craver, MD;  Location: [facility];  Service: Endoscopy;  Laterality: N/A;  . dialysis cath placed    . ESOPHAGOGASTRODUODENOSCOPY (EGD) WITH PROPOFOL N/A 12/02/2015   Procedure: ESOPHAGOGASTRODUODENOSCOPY (EGD) WITH PROPOFOL;  Surgeon: Juanita Craver, MD;  Location: [facility];  Service: Endoscopy;  Laterality: N/A;  . EVALUATION UNDER ANESTHESIA WITH HEMORRHOIDECTOMY N/A 08/15/2018   Procedure: HEMORRHOID LIGATION/PEXY;  Surgeon: Michael Boston, MD;  Location: [place];  Service: General;  Laterality: N/A;  . FINGER SURGERY     L pinkie finger- ORIF- /w remaining hardware - 1990's    . FISTULOTOMY N/A 08/15/2018   Procedure: SUPERFICIAL ANAL FISTULOTOMY;  Surgeon: Michael Boston, MD;  Location: [place];  Service: General;  Laterality: N/A;  . HEMORRHOID SURGERY    . HERNIA REPAIR N/A 10/23/930   Umbilical hernia repair  . INSERTION OF DIALYSIS CATHETER N/A 01/27/2016   Procedure: INSERTION OF DIALYSIS CATHETER;  Surgeon: Waynetta Sandy, MD;  Location: [place];  Service: Vascular;  Laterality: N/A;  . INSERTION OF MESH N/A 07/04/2012   Procedure: INSERTION OF MESH;  Surgeon: Madilyn Hook, DO;  Location: [place];  Service: General;  Laterality: N/A;  . IR EMBO ART  VEN HEMORR LYMPH EXTRAV  INC GUIDE ROADMAPPING  01/13/2018  . IR FLUORO GUIDE CV LINE RIGHT  01/13/2018  . IR IVC FILTER PLMT / S&I /IMG GUID/MOD SED  01/27/2018  . IR  RENAL SELECTIVE  UNI INC S&I MOD SED  01/13/2018  . IR US GUIDE VASC ACCESS RIGHT  01/13/2018  . KIDNEY CYST REMOVAL  2019  . LAPAROSCOPIC LYSIS OF ADHESIONS N/A 02/12/2016   Procedure: LAPAROSCOPIC LYSIS OF ADHESIONS;  Surgeon: Jackolyn Confer, MD;  Location: [place];  Service: General;  Laterality: N/A;  . LEFT AND RIGHT HEART CATHETERIZATION WITH CORONARY ANGIOGRAM N/A 08/04/2012   Procedure: LEFT AND RIGHT HEART CATHETERIZATION WITH CORONARY ANGIOGRAM;  Surgeon: Birdie Riddle, MD;  Location: [place] CATH LAB;  Service: Cardiovascular;  Laterality: N/A;  . NEPHRECTOMY  2000   right  . REVISON OF ARTERIOVENOUS FISTULA Left 06/05/2013   Procedure: REVISON OF LEFT RADIOCEPHALIC  ARTERIOVENOUS FISTULA;  Surgeon: Conrad Macksburg, MD;  Location: [place];  Service: Vascular;  Laterality: Left;  . REVISON OF ARTERIOVENOUS FISTULA Left 01/27/2016   Procedure: REVISION OF LEFT UPPER EXTREMITY ARTERIOVENOUS FISTULA;  Surgeon: Waynetta Sandy, MD;  Location: [place];  Service: Vascular;  Laterality: Left;  . REVISON OF ARTERIOVENOUS FISTULA  Left 08/03/2016   Procedure: REVISON OF Left arm ARTERIOVENOUS FISTULA;  Surgeon: Elam Dutch, MD;  Location: [place];  Service: Vascular;  Laterality: Left;  . REVISON OF ARTERIOVENOUS FISTULA Left 05/06/2017   Procedure: REVISION PLICATION OF ARTERIOVENOUS FISTULA LEFT ARM;  Surgeon: Rosetta Posner, MD;  Location: [place];  Service: Vascular;  Laterality: Left;  . REVISON OF ARTERIOVENOUS FISTULA Left 02/01/2019   Procedure: REVISION OF ARTERIOVENOUS FISTULA LEFT ARM;  Surgeon: [name], MD;  Location: [place];  Service: Vascular;  Laterality: Left;  . REVISON OF ARTERIOVENOUS FISTULA Left 03/15/2019   Procedure: REVISION PLICATION OF ARTERIOVENOUS FISTULA LEFT ARM;  Surgeon: [name], MD;  Location: [place];  Service: Vascular;  Laterality: Left;  . RIGHT HEART CATHETERIZATION N/A 05/21/2011   Procedure: RIGHT HEART CATH;  Surgeon: Birdie Riddle, MD;   Location: [facility] CATH LAB;  Service: Cardiovascular;  Laterality: N/A;  . smashed  1990's   "left pinky; have a plate in there"  . UMBILICAL HERNIA REPAIR N/A 07/04/2012   Procedure: LAPAROSCOPIC UMBILICAL HERNIA;  Surgeon: Madilyn Hook, DO;  Location: [place];  Service: General;  Laterality: N/A;  . US ECHOCARDIOGRAPHY  05/20/11     Current Meds  Medication Sig  . albuterol (VENTOLIN HFA) 108 (90 Base) MCG/ACT inhaler Inhale 2 puffs into the lungs every 6 (six) hours as needed for wheezing or shortness of breath.  . allopurinol (ZYLOPRIM) 100 MG tablet Take 1 tablet (100 mg total) by mouth daily.  Marland Kitchen amiodarone (PACERONE) 200 MG tablet Take 200 mg by mouth daily.   Marland Kitchen amoxicillin-clavulanate (AUGMENTIN) 250-125 MG tablet Take 1 tablet by mouth 3 (three) times daily.  . calcium acetate (PHOSLO) 667 MG capsule Take 1-3 capsules (667-2,001 mg total) by mouth See admin instructions. Take 2001 mg capsules with meals three times daily and 667 mg capsule with snacks. (Patient taking differently: Take 2,001 mg by mouth daily.)  . chlorhexidine (PERIDEX) 0.12 % solution 15 mLs by Mouth Rinse route in the morning and at bedtime.  . cinacalcet (SENSIPAR) 30 MG tablet Take 30 mg by mouth daily.   . diclofenac Sodium (VOLTAREN) 1 % GEL Apply 2 g topically 4 (four) times daily.  . ferric citrate (AURYXIA) 1 GM 210 MG(Fe) tablet Take 210 mg by mouth 3 (three) times daily with meals.   . fexofenadine-pseudoephedrine (ALLEGRA-D) 60-120 MG 12 hr tablet Take 1 tablet by mouth every 12 (twelve) hours.  . fluticasone furoate-vilanterol (BREO ELLIPTA) 100-25 MCG/INH AEPB Inhale 1 puff into the lungs 2 (two) times daily.  Marland Kitchen HYDROcodone-homatropine (HYDROMET) 5-1.5 MG/5ML syrup Take 5 mLs by mouth every 6 (six) hours as needed.  Marland Kitchen ipratropium (ATROVENT) 0.06 % nasal spray Place 2 sprays into both nostrils 4 (four) times daily.  . Lidocaine, Anorectal, (L-M-X 5) 5 % CREA Apply 1 application topically 4 (four) times daily as  needed (apply to area up to 6 times daily as needed).  Marland Kitchen lidocaine-prilocaine (EMLA) cream Apply 1 application topically See admin instructions. Apply topically to port access prior to dialysis - Monday, Wednesday, Friday.  . Methoxy PEG-Epoetin Beta (MIRCERA IJ) Mircera  . midodrine (PROAMATINE) 10 MG tablet Take 10 mg by mouth 3 (three) times daily.  . multivitamin (RENA-VIT) TABS tablet Take 1 tablet by mouth at bedtime.  . nitroGLYCERIN (NITROSTAT) 0.4 MG SL tablet Place 0.4 mg under the tongue every 5 (five) minutes as needed for chest pain.   . Nutritional Supplements (FEEDING SUPPLEMENT, NEPRO CARB  STEADY,) LIQD Take 237 mLs by mouth daily.  . ondansetron (ZOFRAN ODT) 4 MG disintegrating tablet Take 1 tablet (4 mg total) by mouth every 8 (eight) hours as needed for nausea or vomiting.  . pantoprazole (PROTONIX) 40 MG tablet Take 40 mg by mouth 2 (two) times daily.  . sucroferric oxyhydroxide (VELPHORO) 500 MG chewable tablet Chew 500 mg by mouth daily.   Marland Kitchen UNABLE TO FIND Out patient physical therapy- vestibular therapy for BPPV  . warfarin (COUMADIN) 2.5 MG tablet Take 3 tablets (7.5 mg total) by mouth daily at 6 PM. (Patient taking differently: Take 2.5 mg by mouth daily.)  . [DISCONTINUED] gabapentin (NEURONTIN) 100 MG capsule Take 1 capsule (100 mg total) by mouth 3 (three) times daily as needed (coughing uncontrollably).  . [DISCONTINUED] HYDROcodone-homatropine (HYDROMET) 5-1.5 MG/5ML syrup Take 5 mLs by mouth 2 (two) times daily as needed.     Allergies:   Ace inhibitors   Social History   Tobacco Use  . Smoking status: Never Smoker  . Smokeless tobacco: Never Used  Vaping Use  . Vaping Use: Never used  Substance Use Topics  . Alcohol use: No    Alcohol/week: 0.0 standard drinks  . Drug use: No    Types: Cocaine    Comment: Past hx-none in 20 yrs     Family Hx: The patient's family history includes Hypertension in his mother; Kidney disease in his mother.  ROS:    Please see the history of present illness.    Review of Systems  Constitutional: Negative.   Respiratory: Positive for cough and wheezing. Negative for sputum production and shortness of breath.   Cardiovascular: Negative.  Negative for chest pain.  Psychiatric/Behavioral: Negative.     All other systems reviewed and are negative.   Labs/Other Tests and Data Reviewed:    Recent Labs: 04/24/2020: ALT 10 04/25/2020: B Natriuretic Peptide 2,042.9; BUN 38; Creatinine, Ser 11.44; Hemoglobin 10.0; Platelets 239; Potassium 4.7; Sodium 133   Recent Lipid Panel Lab Results  Component Value Date/Time   CHOL 200 (H) 11/15/2019 10:12 AM   TRIG 199 (H) 11/15/2019 10:12 AM   HDL 53 11/15/2019 10:12 AM   CHOLHDL 3.8 11/15/2019 10:12 AM   CHOLHDL 3.5 10/06/2012 04:10 AM   LDLCALC 112 (H) 11/15/2019 10:12 AM    Wt Readings from Last 3 Encounters:  04/25/20 238 lb 1.6 oz (108 kg)  02/06/20 251 lb 9.6 oz (114.1 kg)  11/15/19 256 lb (116.1 kg)     Exam:    Vital Signs:  BP 94/60 (BP Location: Left Arm, Patient Position: Sitting, Cuff Size: Small)   SpO2 100%     Physical Exam Constitutional:      General: He is not in acute distress.    Appearance: Normal appearance.  Cardiovascular:     Rate and Rhythm: Normal rate and regular rhythm.  Pulmonary:     Effort: Pulmonary effort is normal. No respiratory distress.  Neurological:     Mental Status: He is alert.     Cranial Nerves: No cranial nerve deficit.  Psychiatric:        Mood and Affect: Mood normal.        Behavior: Behavior normal.        Thought Content: Thought content normal.        Judgment: Judgment normal.     ASSESSMENT & PLAN:    1. Persistent cough  Will try him on breo two times a day, his daughter will come and pick  up samples and will do a PA if necessary.  I feel like he may also benefit from a referral to pulmonology since this has been ongoing however his cough could also be related to CHF  I have  advised him to contact his cardiologist for a follow up since his last hospital visit.   The gabapentin was refilled as this helped with his persistent cough and hydromet given to use sparingly and to make sure he is not taking with the gabapentin - fluticasone furoate-vilanterol (BREO ELLIPTA) 100-25 MCG/INH AEPB; Inhale 1 puff into the lungs 2 (two) times daily.  Dispense: 60 each; Refill: 1 - gabapentin (NEURONTIN) 100 MG capsule; Take 1 capsule (100 mg total) by mouth 3 (three) times daily as needed (coughing uncontrollably).  Dispense: 90 capsule; Refill: 1 - HYDROcodone-homatropine (HYDROMET) 5-1.5 MG/5ML syrup; Take 5 mLs by mouth every 6 (six) hours as needed.  Dispense: 120 mL; Refill: 0   COVID-19 Education: The signs and symptoms of COVID-19 were discussed with the patient and how to seek care for testing (follow up with PCP or arrange E-visit).  The importance of social distancing was discussed today.  Patient Risk:   After full review of this patients clinical status, I feel that they are at least moderate risk at this time.  Time:   Today, I have spent 11 minutes/ seconds with the patient with telehealth technology discussing above diagnoses.     Medication Adjustments/Labs and Tests Ordered: Current medicines are reviewed at length with the patient today.  Concerns regarding medicines are outlined above.   Tests Ordered: No orders of the defined types were placed in this encounter.   Medication Changes: Meds ordered this encounter  Medications  . fluticasone furoate-vilanterol (BREO ELLIPTA) 100-25 MCG/INH AEPB    Sig: Inhale 1 puff into the lungs 2 (two) times daily.    Dispense:  60 each    Refill:  1  . gabapentin (NEURONTIN) 100 MG capsule    Sig: Take 1 capsule (100 mg total) by mouth 3 (three) times daily as needed (coughing uncontrollably).    Dispense:  90 capsule    Refill:  1  . HYDROcodone-homatropine (HYDROMET) 5-1.5 MG/5ML syrup    Sig: Take 5 mLs by  mouth every 6 (six) hours as needed.    Dispense:  120 mL    Refill:  0    Disposition:  Follow up prn  Signed, [name], FNP

## 2020-06-03 NOTE — Telephone Encounter (Signed)
The pt scheduled a virtual appt for his cough.

## 2020-06-03 NOTE — Telephone Encounter (Signed)
Pt's medication list was updated.

## 2020-06-04 ENCOUNTER — Other Ambulatory Visit: Payer: Self-pay

## 2020-06-04 ENCOUNTER — Encounter: Payer: Self-pay | Admitting: Nurse Practitioner

## 2020-06-04 ENCOUNTER — Inpatient Hospital Stay (HOSPITAL_COMMUNITY)
Admission: EM | Admit: 2020-06-04 | Discharge: 2020-06-09 | DRG: 871 | Disposition: A | Discharge status: Home / Self Care | Payer: Medicare Other | Attending: Internal Medicine | Admitting: Internal Medicine

## 2020-06-04 ENCOUNTER — Encounter (HOSPITAL_COMMUNITY): Payer: Self-pay | Admitting: Family Medicine

## 2020-06-04 ENCOUNTER — Emergency Department (HOSPITAL_COMMUNITY): Payer: Medicare Other

## 2020-06-04 ENCOUNTER — Encounter (HOSPITAL_COMMUNITY): Payer: Self-pay

## 2020-06-04 DIAGNOSIS — E119 Type 2 diabetes mellitus without complications: Secondary | ICD-10-CM

## 2020-06-04 DIAGNOSIS — I420 Dilated cardiomyopathy: Secondary | ICD-10-CM | POA: Diagnosis not present

## 2020-06-04 DIAGNOSIS — I483 Typical atrial flutter: Secondary | ICD-10-CM | POA: Diagnosis not present

## 2020-06-04 DIAGNOSIS — Z6832 Body mass index (BMI) 32.0-32.9, adult: Secondary | ICD-10-CM

## 2020-06-04 DIAGNOSIS — A419 Sepsis, unspecified organism: Principal | ICD-10-CM | POA: Diagnosis present

## 2020-06-04 DIAGNOSIS — I252 Old myocardial infarction: Secondary | ICD-10-CM

## 2020-06-04 DIAGNOSIS — N186 End stage renal disease: Secondary | ICD-10-CM | POA: Diagnosis not present

## 2020-06-04 DIAGNOSIS — E440 Moderate protein-calorie malnutrition: Secondary | ICD-10-CM | POA: Diagnosis present

## 2020-06-04 DIAGNOSIS — I9589 Other hypotension: Secondary | ICD-10-CM | POA: Diagnosis not present

## 2020-06-04 DIAGNOSIS — N2581 Secondary hyperparathyroidism of renal origin: Secondary | ICD-10-CM | POA: Diagnosis present

## 2020-06-04 DIAGNOSIS — E1129 Type 2 diabetes mellitus with other diabetic kidney complication: Secondary | ICD-10-CM | POA: Diagnosis not present

## 2020-06-04 DIAGNOSIS — R6520 Severe sepsis without septic shock: Secondary | ICD-10-CM | POA: Diagnosis present

## 2020-06-04 DIAGNOSIS — I132 Hypertensive heart and chronic kidney disease with heart failure and with stage 5 chronic kidney disease, or end stage renal disease: Secondary | ICD-10-CM | POA: Diagnosis present

## 2020-06-04 DIAGNOSIS — K219 Gastro-esophageal reflux disease without esophagitis: Secondary | ICD-10-CM | POA: Diagnosis present

## 2020-06-04 DIAGNOSIS — Z841 Family history of disorders of kidney and ureter: Secondary | ICD-10-CM

## 2020-06-04 DIAGNOSIS — E1122 Type 2 diabetes mellitus with diabetic chronic kidney disease: Secondary | ICD-10-CM | POA: Diagnosis present

## 2020-06-04 DIAGNOSIS — A084 Viral intestinal infection, unspecified: Secondary | ICD-10-CM | POA: Diagnosis present

## 2020-06-04 DIAGNOSIS — E860 Dehydration: Secondary | ICD-10-CM | POA: Diagnosis not present

## 2020-06-04 DIAGNOSIS — D689 Coagulation defect, unspecified: Secondary | ICD-10-CM | POA: Diagnosis not present

## 2020-06-04 DIAGNOSIS — E861 Hypovolemia: Secondary | ICD-10-CM | POA: Diagnosis present

## 2020-06-04 DIAGNOSIS — I5042 Chronic combined systolic (congestive) and diastolic (congestive) heart failure: Secondary | ICD-10-CM | POA: Diagnosis not present

## 2020-06-04 DIAGNOSIS — Z79899 Other long term (current) drug therapy: Secondary | ICD-10-CM

## 2020-06-04 DIAGNOSIS — I5023 Acute on chronic systolic (congestive) heart failure: Secondary | ICD-10-CM | POA: Diagnosis not present

## 2020-06-04 DIAGNOSIS — R059 Cough, unspecified: Secondary | ICD-10-CM | POA: Diagnosis not present

## 2020-06-04 DIAGNOSIS — I5022 Chronic systolic (congestive) heart failure: Secondary | ICD-10-CM | POA: Diagnosis present

## 2020-06-04 DIAGNOSIS — Z992 Dependence on renal dialysis: Secondary | ICD-10-CM

## 2020-06-04 DIAGNOSIS — D509 Iron deficiency anemia, unspecified: Secondary | ICD-10-CM | POA: Diagnosis not present

## 2020-06-04 DIAGNOSIS — Z8673 Personal history of transient ischemic attack (TIA), and cerebral infarction without residual deficits: Secondary | ICD-10-CM

## 2020-06-04 DIAGNOSIS — E669 Obesity, unspecified: Secondary | ICD-10-CM | POA: Diagnosis present

## 2020-06-04 DIAGNOSIS — I4891 Unspecified atrial fibrillation: Secondary | ICD-10-CM | POA: Diagnosis present

## 2020-06-04 DIAGNOSIS — I4892 Unspecified atrial flutter: Secondary | ICD-10-CM | POA: Diagnosis present

## 2020-06-04 DIAGNOSIS — I517 Cardiomegaly: Secondary | ICD-10-CM | POA: Diagnosis not present

## 2020-06-04 DIAGNOSIS — Z7901 Long term (current) use of anticoagulants: Secondary | ICD-10-CM | POA: Diagnosis not present

## 2020-06-04 DIAGNOSIS — D631 Anemia in chronic kidney disease: Secondary | ICD-10-CM | POA: Diagnosis not present

## 2020-06-04 DIAGNOSIS — Z20822 Contact with and (suspected) exposure to covid-19: Secondary | ICD-10-CM | POA: Diagnosis present

## 2020-06-04 DIAGNOSIS — Z905 Acquired absence of kidney: Secondary | ICD-10-CM | POA: Diagnosis not present

## 2020-06-04 DIAGNOSIS — I959 Hypotension, unspecified: Secondary | ICD-10-CM | POA: Diagnosis not present

## 2020-06-04 DIAGNOSIS — I2510 Atherosclerotic heart disease of native coronary artery without angina pectoris: Secondary | ICD-10-CM | POA: Diagnosis present

## 2020-06-04 DIAGNOSIS — Z85528 Personal history of other malignant neoplasm of kidney: Secondary | ICD-10-CM

## 2020-06-04 DIAGNOSIS — I480 Paroxysmal atrial fibrillation: Secondary | ICD-10-CM | POA: Diagnosis not present

## 2020-06-04 DIAGNOSIS — Z8249 Family history of ischemic heart disease and other diseases of the circulatory system: Secondary | ICD-10-CM

## 2020-06-04 DIAGNOSIS — G894 Chronic pain syndrome: Secondary | ICD-10-CM | POA: Diagnosis present

## 2020-06-04 DIAGNOSIS — Z7951 Long term (current) use of inhaled steroids: Secondary | ICD-10-CM

## 2020-06-04 DIAGNOSIS — Z888 Allergy status to other drugs, medicaments and biological substances status: Secondary | ICD-10-CM

## 2020-06-04 DIAGNOSIS — R21 Rash and other nonspecific skin eruption: Secondary | ICD-10-CM | POA: Diagnosis not present

## 2020-06-04 DIAGNOSIS — J9811 Atelectasis: Secondary | ICD-10-CM | POA: Diagnosis not present

## 2020-06-04 DIAGNOSIS — R197 Diarrhea, unspecified: Secondary | ICD-10-CM | POA: Diagnosis not present

## 2020-06-04 HISTORY — DX: Hypovolemia: I95.89

## 2020-06-04 HISTORY — DX: Hypovolemia: E86.1

## 2020-06-04 LAB — BASIC METABOLIC PANEL
Anion gap: 16 — ABNORMAL HIGH (ref 5–15)
BUN: 27 mg/dL — ABNORMAL HIGH (ref 6–20)
CO2: 28 mmol/L (ref 22–32)
Calcium: 9.5 mg/dL (ref 8.9–10.3)
Chloride: 91 mmol/L — ABNORMAL LOW (ref 98–111)
Creatinine, Ser: 6.38 mg/dL — ABNORMAL HIGH (ref 0.61–1.24)
GFR, Estimated: 9 mL/min — ABNORMAL LOW (ref >60)
Glucose, Bld: 88 mg/dL (ref 70–99)
Potassium: 4.5 mmol/L (ref 3.5–5.1)
Sodium: 135 mmol/L (ref 135–145)

## 2020-06-04 LAB — CBC
HCT: 39 % (ref 39.0–52.0)
Hemoglobin: 12.2 g/dL — ABNORMAL LOW (ref 13.0–17.0)
MCH: 30.2 pg (ref 26.0–34.0)
MCHC: 31.3 g/dL (ref 30.0–36.0)
MCV: 96.5 fL (ref 80.0–100.0)
Platelets: 223 10*3/uL (ref 150–400)
RBC: 4.04 MIL/uL — ABNORMAL LOW (ref 4.22–5.81)
RDW: 18.6 % — ABNORMAL HIGH (ref 11.5–15.5)
WBC: 7 10*3/uL (ref 4.0–10.5)
nRBC: 0 % (ref 0.0–0.2)

## 2020-06-04 LAB — RESP PANEL BY RT-PCR (FLU A&B, COVID) ARPGX2
Influenza A by PCR: NEGATIVE
Influenza B by PCR: NEGATIVE
SARS Coronavirus 2 by RT PCR: NEGATIVE

## 2020-06-04 LAB — POCT INR: INR: 2 — AB (ref <=1.1)

## 2020-06-04 LAB — MAGNESIUM: Magnesium: 2.3 mg/dL (ref 1.7–2.4)

## 2020-06-04 LAB — LACTIC ACID, PLASMA: Lactic Acid, Venous: 2.8 mmol/L (ref 0.5–1.9)

## 2020-06-04 MED ORDER — SODIUM CHLORIDE 0.9 % IV SOLN: 2.0000 g | INTRAVENOUS | Status: DC

## 2020-06-04 MED ORDER — FLUTICASONE FUROATE-VILANTEROL 100-25 MCG/INH IN AEPB
1.0000 | INHALATION_SPRAY | Freq: Two times a day (BID) | RESPIRATORY_TRACT | Status: DC
  Administered 2020-06-05 – 2020-06-09 (×9): 1 via RESPIRATORY_TRACT
  Filled 2020-06-04: qty 28

## 2020-06-04 MED ORDER — SODIUM CHLORIDE 0.9 % IV SOLN
2.0000 g | Freq: Once | INTRAVENOUS | Status: AC
  Administered 2020-06-04: 2 g via INTRAVENOUS
  Filled 2020-06-04: qty 2

## 2020-06-04 MED ORDER — ACETAMINOPHEN 325 MG PO TABS: 650.0000 mg | ORAL_TABLET | Freq: Four times a day (QID) | ORAL | Status: DC | PRN

## 2020-06-04 MED ORDER — SODIUM CHLORIDE 0.9 % IV BOLUS
500.0000 mL | Freq: Once | INTRAVENOUS | Status: AC
  Administered 2020-06-04: 500 mL via INTRAVENOUS

## 2020-06-04 MED ORDER — METRONIDAZOLE IN NACL 5-0.79 MG/ML-% IV SOLN
500.0000 mg | Freq: Three times a day (TID) | INTRAVENOUS | Status: DC
  Administered 2020-06-05 – 2020-06-08 (×10): 500 mg via INTRAVENOUS
  Filled 2020-06-04 (×11): qty 100

## 2020-06-04 MED ORDER — METRONIDAZOLE IN NACL 5-0.79 MG/ML-% IV SOLN
500.0000 mg | Freq: Once | INTRAVENOUS | Status: AC
  Administered 2020-06-04: 500 mg via INTRAVENOUS
  Filled 2020-06-04: qty 100

## 2020-06-04 MED ORDER — ONDANSETRON HCL 4 MG PO TABS: 4.0000 mg | ORAL_TABLET | Freq: Four times a day (QID) | ORAL | Status: DC | PRN

## 2020-06-04 MED ORDER — PANTOPRAZOLE SODIUM 40 MG PO TBEC
40.0000 mg | DELAYED_RELEASE_TABLET | Freq: Two times a day (BID) | ORAL | Status: DC
  Administered 2020-06-05 – 2020-06-09 (×10): 40 mg via ORAL
  Filled 2020-06-04 (×10): qty 1

## 2020-06-04 MED ORDER — LACTATED RINGERS IV SOLN: INTRAVENOUS | Status: DC

## 2020-06-04 MED ORDER — NEPRO/CARBSTEADY PO LIQD
237.0000 mL | Freq: Every day | ORAL | Status: DC
  Administered 2020-06-05 – 2020-06-08 (×4): 237 mL via ORAL
  Filled 2020-06-04: qty 237

## 2020-06-04 MED ORDER — ACETAMINOPHEN 650 MG RE SUPP: 650.0000 mg | Freq: Four times a day (QID) | RECTAL | Status: DC | PRN

## 2020-06-04 MED ORDER — CALCIUM ACETATE (PHOS BINDER) 667 MG PO CAPS
2001.0000 mg | ORAL_CAPSULE | Freq: Three times a day (TID) | ORAL | Status: DC
  Filled 2020-06-04 (×9): qty 3

## 2020-06-04 MED ORDER — VANCOMYCIN HCL IN DEXTROSE 1-5 GM/200ML-% IV SOLN: 1000.0000 mg | INTRAVENOUS | Status: DC

## 2020-06-04 MED ORDER — VANCOMYCIN HCL 10 G IV SOLR
2500.0000 mg | Freq: Once | INTRAVENOUS | Status: AC
  Administered 2020-06-04: 2500 mg via INTRAVENOUS
  Filled 2020-06-04: qty 2000

## 2020-06-04 MED ORDER — VANCOMYCIN HCL 2000 MG/400ML IV SOLN
2000.0000 mg | Freq: Once | INTRAVENOUS | Status: DC
  Filled 2020-06-04: qty 400

## 2020-06-04 MED ORDER — ALBUMIN HUMAN 25 % IV SOLN
25.0000 g | Freq: Once | INTRAVENOUS | Status: AC
  Administered 2020-06-05: 25 g via INTRAVENOUS
  Filled 2020-06-04 (×2): qty 100

## 2020-06-04 MED ORDER — CINACALCET HCL 30 MG PO TABS
180.0000 mg | ORAL_TABLET | Freq: Every day | ORAL | Status: DC
  Administered 2020-06-05 – 2020-06-09 (×5): 180 mg via ORAL
  Filled 2020-06-04 (×5): qty 6

## 2020-06-04 MED ORDER — MIDODRINE HCL 5 MG PO TABS
10.0000 mg | ORAL_TABLET | Freq: Three times a day (TID) | ORAL | Status: DC
  Administered 2020-06-05 – 2020-06-09 (×13): 10 mg via ORAL
  Filled 2020-06-04 (×13): qty 2

## 2020-06-04 MED ORDER — ONDANSETRON HCL 4 MG/2ML IJ SOLN
4.0000 mg | Freq: Four times a day (QID) | INTRAMUSCULAR | Status: DC | PRN
  Administered 2020-06-06 – 2020-06-08 (×2): 4 mg via INTRAVENOUS
  Filled 2020-06-04 (×2): qty 2

## 2020-06-04 MED ORDER — LACTATED RINGERS IV BOLUS (SEPSIS)
500.0000 mL | Freq: Once | INTRAVENOUS | Status: AC
  Administered 2020-06-04: 500 mL via INTRAVENOUS

## 2020-06-04 MED ORDER — DICLOFENAC SODIUM 1 % EX GEL
2.0000 g | Freq: Four times a day (QID) | CUTANEOUS | Status: DC
  Administered 2020-06-05 – 2020-06-08 (×5): 2 g via TOPICAL
  Filled 2020-06-04 (×2): qty 100

## 2020-06-04 MED ORDER — LACTATED RINGERS IV BOLUS
500.0000 mL | Freq: Once | INTRAVENOUS | Status: AC
  Administered 2020-06-04: 500 mL via INTRAVENOUS

## 2020-06-04 MED ORDER — VANCOMYCIN HCL IN DEXTROSE 1-5 GM/200ML-% IV SOLN: 1000.0000 mg | Freq: Once | INTRAVENOUS | Status: DC

## 2020-06-04 MED ORDER — SUCROFERRIC OXYHYDROXIDE 500 MG PO CHEW
1000.0000 mg | CHEWABLE_TABLET | Freq: Three times a day (TID) | ORAL | Status: DC
  Administered 2020-06-05 – 2020-06-09 (×7): 1000 mg via ORAL
  Filled 2020-06-04 (×15): qty 2

## 2020-06-04 MED ORDER — ALBUTEROL SULFATE HFA 108 (90 BASE) MCG/ACT IN AERS
2.0000 | INHALATION_SPRAY | Freq: Four times a day (QID) | RESPIRATORY_TRACT | Status: DC | PRN
  Administered 2020-06-09: 2 via RESPIRATORY_TRACT
  Filled 2020-06-04: qty 6.7

## 2020-06-04 MED ORDER — GABAPENTIN 100 MG PO CAPS
100.0000 mg | ORAL_CAPSULE | Freq: Three times a day (TID) | ORAL | Status: DC | PRN
Start: 2020-06-04 — End: 2020-06-09

## 2020-06-04 NOTE — ED Triage Notes (Signed)
Pt arrived via walk in, had dialysis today, states they noticed his HR was high, told to go to ED. Pt hypotensive on arrival. Denies any CP or SOB at this time.

## 2020-06-04 NOTE — H&P (Signed)
History and Physical    [name] WUX:[id number] DOB: 10/24/1960 DOA: 06/04/2020  PCP: Glendale Chard, MD   Patient coming from: Home   Chief Complaint: Fatigue, tachycardic and hypotensive at dialysis   HPI: [name] is a 60 y.o. male with medical history significant for ESRD on hemodialysis, chronic systolic CHF, chronic pain, atrial fibrillation on warfarin, and iron deficiency anemia, now presenting to the emergency department with fatigue.  Patient was at dialysis where he completed his treatment but was then noted to be tachycardic and hypotensive and sent to the emergency department for evaluation of this.  Patient reports a nonproductive cough for several months now despite treatment with antibiotics and steroids, reports testing negative for COVID recently, has received 3 doses of COVID-vaccine, and he reports nausea with nonbloody vomiting as well as diarrhea for roughly 2 weeks now.  The patient reports having 3 loose stools so far today.  There is some mild generalized abdominal discomfort associated with this that improves for a while after having a loose stool.  He denies any fevers, chills, lightheadedness, or chest pain.  Reports that he took his midodrine today.  ED Course: Upon arrival to the ED, patient is found to be afebrile, saturating well on room air, tachycardic to 253G, and with systolic blood pressure ranging from 66-82.  EKG features atrial flutter with rate 124.  Chest x-ray notable for cardiomegaly with vascular congestion.  Chemistry panel features a potassium of 4.5, bicarbonate 28, and BUN 27.  CBC with normal WBC and platelets, and hemoglobin 12.2.  Lactic acid elevated to 2.8.  Blood cultures were collected in the ED and the patient was given 1.5 L of LR, vancomycin, cefepime, and Flagyl.  COVID-19 PCR is pending.  Review of Systems:  All other systems reviewed and apart from HPI, are negative.  Past Medical History:  Diagnosis Date  . Acute lower GI  bleeding 06/21/2018  . Acute respiratory failure with hypoxia ([place]) 01/13/2015  . Atrial flutter ([place])   . Bile leak, postoperative 03/15/2016  . Biliary dyskinesia 02/12/2016  . 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 Cardiomyopathy   . CHF (congestive heart failure) ([place])   . Coronary artery disease    normal coronaries by 10/10/08 cath, Cardiac Cath 08-04-12 epic.Dr. Doylene Canard follows  . Diabetes mellitus    pt. states he's borderline diabetic., no longer taking med,- off med. since 2013  . Dialysis patient [facility])    Mon-Wed-Fri([place])- Left AV fistula  . Diverticulitis November 2016   reoccurred in December 2016  . Diverticulitis of intestine without perforation or abscess without bleeding 04/21/2015  . DVT (deep venous thrombosis) ([place])   . ESRD (end stage renal disease) ([place])   . GERD (gastroesophageal reflux disease)   . Gout   . Hemorrhage of left kidney 01/13/2018  . History of nephrectomy 07/04/2012   History of right nephrectomy in 2002 for renal cell carcinoma   . History of unilateral nephrectomy   . Hypertension   . Low iron   . Myocardial infarction [facility]) ?2006  . Renal cell carcinoma    dialysis- MWF- [address]- Dr. Mercy Moore follows.  . Renal insufficiency   . Shortness of breath 05/19/11   "at rest, lying down, w/exertion"  . Stroke [facility]) 02/2011   05/19/11 denies residual  . Umbilical hernia 64/40/34   unrepaired  . Wears glasses     Past Surgical History:  Procedure Laterality  Date  . ANAL FISTULOTOMY  08/15/2018  . AV FISTULA PLACEMENT  03/29/2011   Procedure: ARTERIOVENOUS (AV) FISTULA CREATION;  Surgeon: Hinda Lenis, MD;  Location: [place];  Service: Vascular;  Laterality: Left;  LEFT RADIOCEPHALIC , Arteriovenous CHENIDP([id number])  . CARDIAC CATHETERIZATION  05/21/11  . CARDIAC CATHETERIZATION N/A 03/16/2016   Procedure: Left Heart Cath and Coronary Angiography;  Surgeon: Dixie Dials, MD;  Location: [place]  CV LAB;  Service: Cardiovascular;  Laterality: N/A;  . CHOLECYSTECTOMY N/A 02/12/2016   Procedure: LAPAROSCOPIC CHOLECYSTECTOMY WITH INTRAOPERATIVE CHOLANGIOGRAM;  Surgeon: Jackolyn Confer, MD;  Location: [place];  Service: General;  Laterality: N/A;  . COLONOSCOPY WITH PROPOFOL N/A 02/01/2017   Procedure: COLONOSCOPY WITH PROPOFOL;  Surgeon: Juanita Craver, MD;  [facility]: [facility];  Service: Endoscopy;  Laterality: N/A;  . dialysis cath placed    . ESOPHAGOGASTRODUODENOSCOPY (EGD) WITH PROPOFOL N/A 12/02/2015   Procedure: ESOPHAGOGASTRODUODENOSCOPY (EGD) WITH PROPOFOL;  Surgeon: Juanita Craver, MD;  [facility];  Service: Endoscopy;  Laterality: N/A;  . EVALUATION UNDER ANESTHESIA WITH HEMORRHOIDECTOMY N/A 08/15/2018   Procedure: HEMORRHOID LIGATION/PEXY;  Surgeon: Michael Boston, MD;  Location: [place];  Service: General;  Laterality: N/A;  . FINGER SURGERY     L pinkie finger- ORIF- /w remaining hardware - 1990's    . FISTULOTOMY N/A 08/15/2018   Procedure: SUPERFICIAL ANAL FISTULOTOMY;  Surgeon: Michael Boston, MD;  Location: [place];  Service: General;  Laterality: N/A;  . HEMORRHOID SURGERY    . HERNIA REPAIR N/A 5/36/1443   Umbilical hernia repair  . INSERTION OF DIALYSIS CATHETER N/A 01/27/2016   Procedure: INSERTION OF DIALYSIS CATHETER;  Surgeon: Waynetta Sandy, MD;  Location: [place];  Service: Vascular;  Laterality: N/A;  . INSERTION OF MESH N/A 07/04/2012   Procedure: INSERTION OF MESH;  Surgeon: Madilyn Hook, DO;  Location: [place];  Service: General;  Laterality: N/A;  . IR EMBO ART  VEN HEMORR LYMPH EXTRAV  INC GUIDE ROADMAPPING  01/13/2018  . IR FLUORO GUIDE CV LINE RIGHT  01/13/2018  . IR IVC FILTER PLMT / S&I /IMG GUID/MOD SED  01/27/2018  . IR RENAL SELECTIVE  UNI INC S&I MOD SED  01/13/2018  . IR US GUIDE VASC ACCESS RIGHT  01/13/2018  . KIDNEY CYST REMOVAL  2019  . LAPAROSCOPIC LYSIS OF ADHESIONS N/A 02/12/2016   Procedure: LAPAROSCOPIC LYSIS OF ADHESIONS;  Surgeon: Jackolyn Confer, MD;  Location: [place];  Service: General;  Laterality: N/A;  . LEFT AND RIGHT HEART CATHETERIZATION WITH CORONARY ANGIOGRAM N/A 08/04/2012   Procedure: LEFT AND RIGHT HEART CATHETERIZATION WITH CORONARY ANGIOGRAM;  Surgeon: Birdie Riddle, MD;  Location: [place] CATH LAB;  Service: Cardiovascular;  Laterality: N/A;  . NEPHRECTOMY  2000   right  . REVISON OF ARTERIOVENOUS FISTULA Left 06/05/2013   Procedure: REVISON OF LEFT RADIOCEPHALIC  ARTERIOVENOUS FISTULA;  Surgeon: Conrad Andrews, MD;  Location: [place];  Service: Vascular;  Laterality: Left;  . REVISON OF ARTERIOVENOUS FISTULA Left 01/27/2016   Procedure: REVISION OF LEFT UPPER EXTREMITY ARTERIOVENOUS FISTULA;  Surgeon: Waynetta Sandy, MD;  Location: [place];  Service: Vascular;  Laterality: Left;  . REVISON OF ARTERIOVENOUS FISTULA Left 08/03/2016   Procedure: REVISON OF Left arm ARTERIOVENOUS FISTULA;  Surgeon: Elam Dutch, MD;  Location: [place];  Service: Vascular;  Laterality: Left;  . REVISON OF ARTERIOVENOUS FISTULA Left 05/06/2017   Procedure: REVISION PLICATION OF ARTERIOVENOUS FISTULA LEFT ARM;  Surgeon: Rosetta Posner, MD;  [facility];  Service: Vascular;  Laterality: Left;  . REVISON OF ARTERIOVENOUS FISTULA Left 02/01/2019   Procedure: REVISION OF ARTERIOVENOUS FISTULA LEFT ARM;  Surgeon: [name], MD;  Location: [place];  Service: Vascular;  Laterality: Left;  . REVISON OF ARTERIOVENOUS FISTULA Left 03/15/2019   Procedure: REVISION PLICATION OF ARTERIOVENOUS FISTULA LEFT ARM;  Surgeon: [name], MD;  Location: [place];  Service: Vascular;  Laterality: Left;  . RIGHT HEART CATHETERIZATION N/A 05/21/2011   Procedure: RIGHT HEART CATH;  Surgeon: Birdie Riddle, MD;  Location: [facility] CATH LAB;  Service: Cardiovascular;  Laterality: N/A;  . smashed  1990's   "left pinky; have a plate in there"  . UMBILICAL HERNIA REPAIR N/A 07/04/2012   Procedure: LAPAROSCOPIC UMBILICAL HERNIA;  Surgeon: Madilyn Hook,  DO;  Location: [place];  Service: General;  Laterality: N/A;  . US ECHOCARDIOGRAPHY  05/20/11    Social History:   reports that he has never smoked. He has never used smokeless tobacco. He reports that he does not drink alcohol and does not use drugs.  Allergies  Allergen Reactions  . Ace Inhibitors Itching, Cough and Other (See Comments)    Family History  Problem Relation Age of Onset  . Hypertension Mother   . Kidney disease Mother      Prior to Admission medications   Medication Sig Start Date End Date Taking? Authorizing Provider  albuterol (VENTOLIN HFA) 108 (90 Base) MCG/ACT inhaler Inhale 2 puffs into the lungs every 6 (six) hours as needed for wheezing or shortness of breath. 09/18/19  Yes [name], FNP  calcium acetate (PHOSLO) 667 MG capsule Take 1-3 capsules (667-2,001 mg total) by mouth See admin instructions. Take 2001 mg capsules with meals three times daily and 667 mg capsule with snacks. Patient taking differently: Take 2,001 mg by mouth daily. 11/23/18  Yes Dixie Dials, MD  chlorhexidine (PERIDEX) 0.12 % solution 15 mLs by Mouth Rinse route 2 (two) times daily. 05/29/18  Yes [provider]  cinacalcet (SENSIPAR) 90 MG tablet Take 180 mg by mouth daily.   Yes [provider]  diclofenac Sodium (VOLTAREN) 1 % GEL Apply 2 g topically 4 (four) times daily. 11/15/19  Yes [name], FNP  ferric citrate (AURYXIA) 1 GM 210 MG(Fe) tablet Take 630 mg by mouth 3 (three) times daily with meals.   Yes [provider]  gabapentin (NEURONTIN) 100 MG capsule Take 1 capsule (100 mg total) by mouth 3 (three) times daily as needed (coughing uncontrollably). 06/03/20  Yes [name], FNP  HYDROcodone-homatropine (HYDROMET) 5-1.5 MG/5ML syrup Take 5 mLs by mouth every 6 (six) hours as needed. Patient taking differently: Take 5 mLs by mouth every 6 (six) hours as needed for cough. 06/03/20  Yes [name], FNP  ipratropium (ATROVENT) 0.06 % nasal spray  Place 2 sprays into both nostrils 4 (four) times daily. Patient taking differently: Place 2 sprays into both nostrils 4 (four) times daily as needed for rhinitis. 11/15/19  Yes [name], FNP  lidocaine-prilocaine (EMLA) cream Apply 1 application topically See admin instructions. Apply topically to port access prior to dialysis - Monday, Wednesday, Friday. 04/08/15  Yes [provider]  midodrine (PROAMATINE) 10 MG tablet Take 10 mg by mouth 3 (three) times daily.   Yes [provider]  multivitamin (RENA-VIT) TABS tablet Take 1 tablet by mouth at bedtime.   Yes [provider]  nitroGLYCERIN (NITROSTAT) 0.4 MG SL tablet Place 0.4 mg under the tongue every  5 (five) minutes as needed for chest pain.  05/20/15  Yes [provider]  Nutritional Supplements (FEEDING SUPPLEMENT, NEPRO CARB STEADY,) LIQD Take 237 mLs by mouth daily. 03/22/16  Yes [provider]  ondansetron (ZOFRAN ODT) 4 MG disintegrating tablet Take 1 tablet (4 mg total) by mouth every 8 (eight) hours as needed for nausea or vomiting. 09/12/19  Yes Carmin Muskrat, MD  oxyCODONE-acetaminophen (PERCOCET/ROXICET) 5-325 MG tablet Take 1 tablet by mouth every 4 (four) hours as needed for severe pain.   Yes [provider]  pantoprazole (PROTONIX) 40 MG tablet Take 40 mg by mouth 2 (two) times daily.   Yes [provider]  sucroferric oxyhydroxide (VELPHORO) 500 MG chewable tablet Chew 1,000 mg by mouth See admin instructions. Takes with each meal 10/02/18  Yes [provider]  warfarin (COUMADIN) 7.5 MG tablet Take 7.5 mg by mouth daily.   Yes [provider]  allopurinol (ZYLOPRIM) 100 MG tablet Take 1 tablet (100 mg total) by mouth daily. Patient not taking: Reported on 06/04/2020 11/24/18   Dixie Dials, MD  amoxicillin-clavulanate (AUGMENTIN) 250-125 MG tablet Take 1 tablet by mouth 3 (three) times daily. Patient not taking: Reported on 06/04/2020 02/06/20    [name], FNP  fexofenadine-pseudoephedrine (ALLEGRA-D) 60-120 MG 12 hr tablet Take 1 tablet by mouth every 12 (twelve) hours. Patient not taking: Reported on 06/04/2020 09/12/19   Carmin Muskrat, MD  fluticasone furoate-vilanterol (BREO ELLIPTA) 100-25 MCG/INH AEPB Inhale 1 puff into the lungs 2 (two) times daily. Patient taking differently: Inhale 1 puff into the lungs 2 (two) times daily as needed (wheezing). 06/03/20   [name], FNP  Lidocaine, Anorectal, (L-M-X 5) 5 % CREA Apply 1 application topically 4 (four) times daily as needed (apply to area up to 6 times daily as needed). Patient not taking: Reported on 06/04/2020 07/09/18   Gareth Morgan, MD  UNABLE TO FIND Out patient physical therapy- vestibular therapy for BPPV 08/01/18   Debbe Odea, MD  warfarin (COUMADIN) 2.5 MG tablet Take 3 tablets (7.5 mg total) by mouth daily at 6 PM. Patient not taking: Reported on 06/04/2020 11/23/18   Dixie Dials, MD    Physical Exam: Vitals:   06/04/20 1841 06/04/20 1900 06/04/20 1944 06/04/20 2000  BP: (!) 66/45 (!) 74/48 (!) 81/60 (!) 82/48  Pulse: (!) 128 (!) 114 (!) 125 (!) 122  Resp: 19 12 (!) 24 19  Temp:      TempSrc:      SpO2: 94% 97% 100% 100%    Constitutional: NAD, calm  Eyes: PERTLA, lids and conjunctivae normal ENMT: Mucous membranes are moist. Posterior pharynx clear of any exudate or lesions.   Neck: normal, supple, no masses, no thyromegaly Respiratory:  no wheezing, no crackles. No accessory muscle use.  Cardiovascular: S1 & S2 heard, regular rate and rhythm. No extremity edema.   Abdomen: Soft, no distension, generally tender without rebound pain or guarding. Bowel sounds active.  Musculoskeletal: no clubbing / cyanosis. No joint deformity upper and lower extremities.   Skin: no significant rashes, lesions, ulcers. Warm, dry, well-perfused. Neurologic: CN 2-12 grossly intact. Sensation intact. Moving all extremities.  Psychiatric: Alert and oriented to person,  place, and situation. Very pleasant and cooperative.    Labs and Imaging on Admission: I have personally reviewed following labs and imaging studies  CBC: Recent Labs  Lab 06/04/20 1741  WBC 7.0  HGB 12.2*  HCT 39.0  MCV 96.5  PLT 371   Basic Metabolic Panel:  Recent Labs  Lab 06/04/20 1739 06/04/20 1741  NA  --  135  K  --  4.5  CL  --  91*  CO2  --  28  GLUCOSE  --  88  BUN  --  27*  CREATININE  --  6.38*  CALCIUM  --  9.5  MG 2.3  --    GFR: CrCl cannot be calculated (Unknown ideal weight.). Liver Function Tests: No results for input(s): AST, ALT, ALKPHOS, BILITOT, PROT, ALBUMIN in the last 168 hours. No results for input(s): LIPASE, AMYLASE in the last 168 hours. No results for input(s): AMMONIA in the last 168 hours. Coagulation Profile: No results for input(s): INR, PROTIME in the last 168 hours. Cardiac Enzymes: No results for input(s): CKTOTAL, CKMB, CKMBINDEX, TROPONINI in the last 168 hours. BNP (last 3 results) No results for input(s): PROBNP in the last 8760 hours. HbA1C: No results for input(s): HGBA1C in the last 72 hours. CBG: No results for input(s): GLUCAP in the last 168 hours. Lipid Profile: No results for input(s): CHOL, HDL, LDLCALC, TRIG, CHOLHDL, LDLDIRECT in the last 72 hours. Thyroid Function Tests: No results for input(s): TSH, T4TOTAL, FREET4, T3FREE, THYROIDAB in the last 72 hours. Anemia Panel: No results for input(s): VITAMINB12, FOLATE, FERRITIN, TIBC, IRON, RETICCTPCT in the last 72 hours. Urine analysis:    Component Value Date/Time   COLORURINE YELLOW 05/19/2011 [place] 05/19/2011 1042   LABSPEC 1.013 05/19/2011 1042   PHURINE 6.5 05/19/2011 1042   GLUCOSEU NEGATIVE 05/19/2011 1042   HGBUR TRACE (A) 05/19/2011 1042   BILIRUBINUR NEGATIVE 05/19/2011 1042   KETONESUR NEGATIVE 05/19/2011 1042   PROTEINUR >300 (A) 05/19/2011 1042   UROBILINOGEN 0.2 05/19/2011 1042   NITRITE NEGATIVE 05/19/2011 1042    LEUKOCYTESUR NEGATIVE 05/19/2011 1042   Sepsis Labs: @LABRCNTIP (procalcitonin:4,lacticidven:4) ) Recent Results (from the past 240 hour(s))  Resp Panel by RT-PCR (Flu A&B, Covid) Nasopharyngeal Swab     Status: None   Collection Time: 06/04/20  7:39 PM   Specimen: Nasopharyngeal Swab; Nasopharyngeal(NP) swabs in vial transport medium  Result Value Ref Range Status   SARS Coronavirus 2 by RT PCR NEGATIVE NEGATIVE Final    Comment: (NOTE) SARS-CoV-2 target nucleic acids are NOT DETECTED.  The SARS-CoV-2 RNA is generally detectable in upper respiratory specimens during the acute phase of infection. The lowest concentration of SARS-CoV-2 viral copies this assay can detect is 138 copies/mL. A negative result does not preclude SARS-Cov-2 infection and should not be used as the sole basis for treatment or other patient management decisions. A negative result may occur with  improper specimen collection/handling, submission of specimen other than nasopharyngeal swab, presence of viral mutation(s) within the areas targeted by this assay, and inadequate number of viral copies(<138 copies/mL). A negative result must be combined with clinical observations, patient history, and epidemiological information. The expected result is Negative.  Fact Sheet for Patients:  EntrepreneurPulse.com.au  Fact Sheet for Healthcare Providers:  IncredibleEmployment.be  This test is no t yet approved or cleared by the [place] FDA and  has been authorized for detection and/or diagnosis of SARS-CoV-2 by FDA under an Emergency Use Authorization (EUA). This EUA will remain  in effect (meaning this test can be used) for the duration of the COVID-19 declaration under Section 564(b)(1) of the Act, 21 U.S.C.section 360bbb-3(b)(1), unless the authorization is terminated  or revoked sooner.       Influenza A by PCR NEGATIVE NEGATIVE Final   Influenza B by  PCR NEGATIVE  NEGATIVE Final    Comment: (NOTE) The Xpert Xpress SARS-CoV-2/FLU/RSV plus assay is intended as an aid in the diagnosis of influenza from Nasopharyngeal swab specimens and should not be used as a sole basis for treatment. Nasal washings and aspirates are unacceptable for Xpert Xpress SARS-CoV-2/FLU/RSV testing.  Fact Sheet for Patients: EntrepreneurPulse.com.au  Fact Sheet for Healthcare Providers: IncredibleEmployment.be  This test is not yet approved or cleared by the [place] FDA and has been authorized for detection and/or diagnosis of SARS-CoV-2 by FDA under an Emergency Use Authorization (EUA). This EUA will remain in effect (meaning this test can be used) for the duration of the COVID-19 declaration under Section 564(b)(1) of the Act, 21 U.S.C. section 360bbb-3(b)(1), unless the authorization is terminated or revoked.  Performed at [facility], [place] [address], [place], [place] [address]      Radiological Exams on Admission: DG Chest Port 1 View  Result Date: 06/04/2020 CLINICAL DATA:  60 year old male with cough. EXAM: PORTABLE CHEST 1 VIEW COMPARISON:  Chest radiograph dated 04/24/2020. FINDINGS: Minimal bibasilar atelectasis. No focal consolidation, pleural effusion or pneumothorax. There is mild cardiomegaly with mild vascular congestion. Atherosclerotic calcification of the aorta. No acute osseous pathology. IMPRESSION: Cardiomegaly with mild vascular congestion. No focal consolidation. Electronically Signed   By: Anner Crete M.D.   On: 06/04/2020 18:44    EKG: Independently reviewed. Atrial flutter with RVR, rate 124.    Assessment/Plan   1. Hypotension  - Presents from dialysis (completed treatment) with tachycardia and hypotension  - No fevers or leukocytosis and no evidence for infection on CXR  - H&H stable and no apparent bleeding  - Likely due to hypovolemia in setting of recent N/V/D  -  Improved with IVF in ED, will need to be judicious with fluids given ESRD and CHF  - Give albumin now, continue midodrine, check procalcitonin and d/c antiobitics if low    2. Diarrhea  - Patient reports 2 weeks of loose stools with mild abd discomfort and some N/V  - He has been on antibiotics recently for URI  - Check C diff, monitor volume status and electrolytes    3. Chronic systolic CHF  - EF was 18-29% in 2020  - Appears compensated  - Volume managed with HD, unable to tolerate beta-blocker or ACE/ARB currently due to hypotension   4. ESRD - Completed HD 06/04/20  - Continue binders, judicious with fluid, renally-dose medications    5. Atrial fibrillation with RVR  - Rate 120s on admission, improving with IVF  - Check INR and continue warfarin with pharmacy assistance   6. Chronic pain  - No pain complaints on admission  - Hold opiates until BP improves     DVT prophylaxis: warfarin  Code Status: Full  Family Communication: Discussed with patient  Disposition Plan:  Patient is from: home  Anticipated d/c is to: TBD Anticipated d/c date is: 06/07/20 Patient currently: Pending improvement in hypotension, rule-out sepsis  Consults called: None  Admission status: Inpatient     Vianne Bulls, MD [facility]  06/04/2020, 9:14 PM

## 2020-06-04 NOTE — Progress Notes (Signed)
Pharmacy Antibiotic Note  [name] is a 60 y.o. male admitted on 06/04/2020 with sepsis.  Pharmacy has been consulted for vancomycin & cefepime dosing. Wt 04/25/2020 = 108 kg.  WBC WNL, ESRD on HD MWF. Had HD today.  LA 2.8, AF  Plan: Cefepime 2 gm IV after each HD qMWF Vancomycin 2500 mg gm loading dose then vancomycin 1 gm after each HD on MWF for target level 15-25 mcg/ml Flagyl 500 mg IV q8h F/u HD sessions/tolerance F/u WBC, temp, culture data Vancomycin levels as needed    Temp (24hrs), Avg:98 F (36.7 C), Min:98 F (36.7 C), Max:98 F (36.7 C)  Recent Labs  Lab 06/04/20 1741  WBC 7.0  CREATININE 6.38*  LATICACIDVEN 2.8*    CrCl cannot be calculated (Unknown ideal weight.).    Allergies  Allergen Reactions  . Ace Inhibitors Itching, Cough and Other (See Comments)    Antimicrobials this admission: 1/12 vanc>> 1/23 cefepime>> Dose adjustments this admission:  Microbiology results: 1/12 covid/flu neg 1/12 BCx2:  sent  Thank you for allowing pharmacy to be a part of this patient's care.  Eudelia Bunch, Pharm.D 06/04/2020 9:29 PM

## 2020-06-04 NOTE — ED Notes (Signed)
Patient ambulatory to restroom with steady gait. Endorses minimal dizziness.

## 2020-06-04 NOTE — ED Provider Notes (Signed)
[facility] DEPT Provider Note   CSN: [id number] Arrival date & time: 06/04/20  1705     History No chief complaint on file.   [name] is a 60 y.o. male.  He has a history of coronary disease CHF and a flutter, end-stage renal disease and was dialyzed today.  Reportedly after dialysis he noticed that his heart rate was elevated and his blood pressure was low.  He denies any chest pain or shortness of breath.  He says he had a little cold for a couple of days with dry cough and sore throat.  No known fevers.  He is COVID vaccinated and boosted.  No sick contacts.  Does not make any urine.  The history is provided by the patient.  Cough Cough characteristics:  Non-productive Sputum characteristics:  Nondescript Severity:  Moderate Onset quality:  Gradual Timing:  Intermittent Progression:  Unchanged Chronicity:  Recurrent Smoker: no   Context: upper respiratory infection   Relieved by:  None tried Worsened by:  Nothing Ineffective treatments:  None tried Associated symptoms: sore throat   Associated symptoms: no chest pain, no chills, no fever, no headaches, no myalgias, no rash, no rhinorrhea, no shortness of breath and no wheezing        Past Medical History:  Diagnosis Date   Acute lower GI bleeding 06/21/2018   Acute respiratory failure with hypoxia ([place]) 01/13/2015   Atrial flutter (HCC)    Bile leak, postoperative 03/15/2016   Biliary dyskinesia 02/12/2016   Blind right eye    Hx: of partial blindness in right eye   Cardiomyopathy    CHF (congestive heart failure) ([place])    Coronary artery disease    normal coronaries by 10/10/08 cath, Cardiac Cath 08-04-12 epic.Dr. Doylene Canard follows   Diabetes mellitus    pt. states he's borderline diabetic., no longer taking med,- off med. since 2013   Dialysis patient [facility])    Mon-Wed-Fri([place])- Left AV fistula   Diverticulitis November 2016   reoccurred in December  2016   Diverticulitis of intestine without perforation or abscess without bleeding 04/21/2015   DVT (deep venous thrombosis) (HCC)    ESRD (end stage renal disease) ([place])    GERD (gastroesophageal reflux disease)    Gout    Hemorrhage of left kidney 01/13/2018   History of nephrectomy 07/04/2012   History of right nephrectomy in 2002 for renal cell carcinoma    History of unilateral nephrectomy    Hypertension    Low iron    Myocardial infarction [facility]) ?2006   Renal cell carcinoma    dialysis- MWF- [address]- Dr. Mercy Moore follows.   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   Supratherapeutic INR 06/20/2018   Melena 06/20/2018   Malnutrition of moderate degree 02/10/2018   Anemia 01/27/2018   Paroxysmal atrial fibrillation ([place]) 01/26/2018   Depression 01/26/2018   GERD (gastroesophageal reflux disease) 01/26/2018   Pulmonary embolism ([place]) 01/26/2018   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   Acute on chronic combined systolic and diastolic CHF, NYHA class 4 ([place]) 07/17/2017  Hypotension 03/16/2017   Paroxysmal atrial flutter (HCC) 03/16/2017   Chronic pain 03/16/2017   Leg pain, bilateral 03/16/2017   Elevated troponin 03/15/2016   Rib pain on right side    Atrial flutter with rapid ventricular response ([place]) 06/27/2015   Right upper quadrant abdominal pain 01/13/2015   Diverticulitis large intestine w/o perforation or abscess w/o bleeding 12/18/2014   Hepatic cyst 12/18/2014   Right sided weakness 10/05/2012   History of nephrectomy 07/04/2012   End-stage renal  disease on hemodialysis ([place]) 06/04/2011   Chronic combined systolic (congestive) and diastolic (congestive) heart failure ([place]) 05/19/2011    Class: Acute   Hypertension, benign 05/19/2011    Class: Chronic   DM II (diabetes mellitus, type II), controlled ([place]) 05/19/2011    Class: Chronic   Gout, chronic 05/19/2011    Past Surgical History:  Procedure Laterality Date   ANAL FISTULOTOMY  08/15/2018   AV FISTULA PLACEMENT  03/29/2011   Procedure: ARTERIOVENOUS (AV) FISTULA CREATION;  Surgeon: Hinda Lenis, MD;  Location: [place];  Service: Vascular;  Laterality: Left;  LEFT RADIOCEPHALIC , Arteriovenous OIZTIWP([id number])   CARDIAC CATHETERIZATION  05/21/11   CARDIAC CATHETERIZATION N/A 03/16/2016   Procedure: Left Heart Cath and Coronary Angiography;  Surgeon: Dixie Dials, MD;  Location: [place] CV LAB;  Service: Cardiovascular;  Laterality: N/A;   CHOLECYSTECTOMY N/A 02/12/2016   Procedure: LAPAROSCOPIC CHOLECYSTECTOMY WITH INTRAOPERATIVE CHOLANGIOGRAM;  Surgeon: Jackolyn Confer, MD;  Location: [place];  Service: General;  Laterality: N/A;   COLONOSCOPY WITH PROPOFOL N/A 02/01/2017   Procedure: COLONOSCOPY WITH PROPOFOL;  Surgeon: Juanita Craver, MD;  Location: [facility];  Service: Endoscopy;  Laterality: N/A;   dialysis cath placed     ESOPHAGOGASTRODUODENOSCOPY (EGD) WITH PROPOFOL N/A 12/02/2015   Procedure: ESOPHAGOGASTRODUODENOSCOPY (EGD) WITH PROPOFOL;  Surgeon: Juanita Craver, MD;  Location: [facility];  Service: Endoscopy;  Laterality: N/A;   EVALUATION UNDER ANESTHESIA WITH HEMORRHOIDECTOMY N/A 08/15/2018   Procedure: HEMORRHOID LIGATION/PEXY;  Surgeon:  Boston, MD;  Location: [place];  Service: General;  Laterality: N/A;   FINGER SURGERY     L pinkie finger- ORIF- /w remaining hardware - 1990's     FISTULOTOMY N/A 08/15/2018   Procedure: SUPERFICIAL ANAL FISTULOTOMY;  Surgeon:  Boston, MD;  Location: [place];  Service: General;  Laterality: N/A;    HEMORRHOID SURGERY     HERNIA REPAIR N/A 3/38/2505   Umbilical hernia repair   INSERTION OF DIALYSIS CATHETER N/A 01/27/2016   Procedure: INSERTION OF DIALYSIS CATHETER;  Surgeon: Waynetta Sandy, MD;  Location: [place];  Service: Vascular;  Laterality: N/A;   INSERTION OF MESH N/A 07/04/2012   Procedure: INSERTION OF MESH;  Surgeon: Madilyn Hook, DO;  Location: [place];  Service: General;  Laterality: N/A;   IR EMBO ART  VEN HEMORR LYMPH EXTRAV  INC GUIDE ROADMAPPING  01/13/2018   IR FLUORO GUIDE CV LINE RIGHT  01/13/2018   IR IVC FILTER PLMT / S&I /IMG GUID/MOD SED  01/27/2018   IR RENAL SELECTIVE  UNI INC S&I MOD SED  01/13/2018   IR US GUIDE VASC ACCESS RIGHT  01/13/2018   KIDNEY CYST REMOVAL  2019   LAPAROSCOPIC LYSIS OF ADHESIONS N/A 02/12/2016   Procedure: LAPAROSCOPIC LYSIS OF ADHESIONS;  Surgeon: Jackolyn Confer, MD;  Location: [place];  Service: General;  Laterality: N/A;   LEFT AND RIGHT HEART CATHETERIZATION WITH CORONARY ANGIOGRAM N/A 08/04/2012   Procedure: LEFT AND RIGHT HEART CATHETERIZATION WITH CORONARY ANGIOGRAM;  Surgeon: Birdie Riddle,  MD;  Location: [place] [facility] CATH LAB;  Service: Cardiovascular;  Laterality: N/A;   NEPHRECTOMY  2000   right   REVISON OF ARTERIOVENOUS FISTULA Left 06/05/2013   Procedure: REVISON OF LEFT RADIOCEPHALIC  ARTERIOVENOUS FISTULA;  Surgeon: Conrad Alice Acres, MD;  Location: [place];  Service: Vascular;  Laterality: Left;   REVISON OF ARTERIOVENOUS FISTULA Left 01/27/2016   Procedure: REVISION OF LEFT UPPER EXTREMITY ARTERIOVENOUS FISTULA;  Surgeon: Waynetta Sandy, MD;  Location: [place];  Service: Vascular;  Laterality: Left;   REVISON OF ARTERIOVENOUS FISTULA Left 08/03/2016   Procedure: REVISON OF Left arm ARTERIOVENOUS FISTULA;  Surgeon: Elam Dutch, MD;  Location: [place];  Service: Vascular;  Laterality: Left;   REVISON OF ARTERIOVENOUS FISTULA Left 05/06/2017   Procedure: REVISION PLICATION OF ARTERIOVENOUS FISTULA LEFT ARM;  Surgeon:  Rosetta Posner, MD;  Location: [facility];  Service: Vascular;  Laterality: Left;   REVISON OF ARTERIOVENOUS FISTULA Left 02/01/2019   Procedure: REVISION OF ARTERIOVENOUS FISTULA LEFT ARM;  Surgeon: [name], MD;  Location: [place];  Service: Vascular;  Laterality: Left;   REVISON OF ARTERIOVENOUS FISTULA Left 03/15/2019   Procedure: REVISION PLICATION OF ARTERIOVENOUS FISTULA LEFT ARM;  Surgeon: [name], MD;  Location: [facility];  Service: Vascular;  Laterality: Left;   RIGHT HEART CATHETERIZATION N/A 05/21/2011   Procedure: RIGHT HEART CATH;  Surgeon: Birdie Riddle, MD;  Location: [facility] CATH LAB;  Service: Cardiovascular;  Laterality: N/A;   smashed  1990's   "left pinky; have a plate in there"   UMBILICAL HERNIA REPAIR N/A 07/04/2012   Procedure: LAPAROSCOPIC UMBILICAL HERNIA;  Surgeon: Madilyn Hook, DO;  Location: [facility];  Service: General;  Laterality: N/A;   US ECHOCARDIOGRAPHY  05/20/11       Family History  Problem Relation Age of Onset   Hypertension Mother    Kidney disease Mother     Social History   Tobacco Use   Smoking status: Never Smoker   Smokeless tobacco: Never Used  Vaping Use   Vaping Use: Never used  Substance Use Topics   Alcohol use: No    Alcohol/week: 0.0 standard drinks   Drug use: No    Types: Cocaine    Comment: Past hx-none in 20 yrs    Home Medications Prior to Admission medications   Medication Sig Start Date End Date Taking? Authorizing Provider  albuterol (VENTOLIN HFA) 108 (90 Base) MCG/ACT inhaler Inhale 2 puffs into the lungs every 6 (six) hours as needed for wheezing or shortness of breath. 09/18/19   [name], FNP  allopurinol (ZYLOPRIM) 100 MG tablet Take 1 tablet (100 mg total) by mouth daily. 11/24/18   Dixie Dials, MD  amiodarone (PACERONE) 200 MG tablet Take 200 mg by mouth daily.  07/05/15   [provider]  amoxicillin-clavulanate (AUGMENTIN) 250-125 MG tablet Take 1 tablet by mouth 3 (three)  times daily. 02/06/20   [name], FNP  calcium acetate (PHOSLO) 667 MG capsule Take 1-3 capsules (667-2,001 mg total) by mouth See admin instructions. Take 2001 mg capsules with meals three times daily and 667 mg capsule with snacks. Patient taking differently: Take 2,001 mg by mouth daily. 11/23/18   Dixie Dials, MD  chlorhexidine (PERIDEX) 0.12 % solution 15 mLs by Mouth Rinse route in the morning and at bedtime. 05/29/18   [provider]  cinacalcet (SENSIPAR) 30 MG tablet Take 30 mg by mouth daily.  09/03/19   [provider]  diclofenac Sodium (  VOLTAREN) 1 % GEL Apply 2 g topically 4 (four) times daily. 11/15/19   [name], FNP  ferric citrate (AURYXIA) 1 GM 210 MG(Fe) tablet Take 210 mg by mouth 3 (three) times daily with meals.     [provider]  fexofenadine-pseudoephedrine (ALLEGRA-D) 60-120 MG 12 hr tablet Take 1 tablet by mouth every 12 (twelve) hours. 09/12/19   Carmin Muskrat, MD  fluticasone furoate-vilanterol (BREO ELLIPTA) 100-25 MCG/INH AEPB Inhale 1 puff into the lungs 2 (two) times daily. 06/03/20   [name], FNP  gabapentin (NEURONTIN) 100 MG capsule Take 1 capsule (100 mg total) by mouth 3 (three) times daily as needed (coughing uncontrollably). 06/03/20   [name], FNP  HYDROcodone-homatropine (HYDROMET) 5-1.5 MG/5ML syrup Take 5 mLs by mouth every 6 (six) hours as needed. 06/03/20   [name], FNP  ipratropium (ATROVENT) 0.06 % nasal spray Place 2 sprays into both nostrils 4 (four) times daily. 11/15/19   [name], FNP  Lidocaine, Anorectal, (L-M-X 5) 5 % CREA Apply 1 application topically 4 (four) times daily as needed (apply to area up to 6 times daily as needed). 07/09/18   Gareth Morgan, MD  lidocaine-prilocaine (EMLA) cream Apply 1 application topically See admin instructions. Apply topically to port access prior to dialysis - Monday, Wednesday, Friday. 04/08/15   [provider]  Methoxy PEG-Epoetin Beta (MIRCERA  IJ) Mircera 12/03/19 04/06/21  [provider]  midodrine (PROAMATINE) 10 MG tablet Take 10 mg by mouth 3 (three) times daily.    [provider]  multivitamin (RENA-VIT) TABS tablet Take 1 tablet by mouth at bedtime.    [provider]  nitroGLYCERIN (NITROSTAT) 0.4 MG SL tablet Place 0.4 mg under the tongue every 5 (five) minutes as needed for chest pain.  05/20/15   [provider]  Nutritional Supplements (FEEDING SUPPLEMENT, NEPRO CARB STEADY,) LIQD Take 237 mLs by mouth daily. 03/22/16   [provider]  ondansetron (ZOFRAN ODT) 4 MG disintegrating tablet Take 1 tablet (4 mg total) by mouth every 8 (eight) hours as needed for nausea or vomiting. 09/12/19   Carmin Muskrat, MD  pantoprazole (PROTONIX) 40 MG tablet Take 40 mg by mouth 2 (two) times daily.    [provider]  sucroferric oxyhydroxide (VELPHORO) 500 MG chewable tablet Chew 500 mg by mouth daily.  10/02/18   [provider]  UNABLE TO FIND Out patient physical therapy- vestibular therapy for BPPV 08/01/18   Debbe Odea, MD  warfarin (COUMADIN) 2.5 MG tablet Take 3 tablets (7.5 mg total) by mouth daily at 6 PM. Patient taking differently: Take 2.5 mg by mouth daily. 11/23/18   Dixie Dials, MD    Allergies    Ace inhibitors  Review of Systems   Review of Systems  Constitutional: Negative for chills and fever.  HENT: Positive for sore throat. Negative for rhinorrhea.   Eyes: Negative for visual disturbance.  Respiratory: Positive for cough. Negative for shortness of breath and wheezing.   Cardiovascular: Negative for chest pain.  Gastrointestinal: Negative for abdominal pain.  Genitourinary: Negative for hematuria.  Musculoskeletal: Negative for myalgias.  Skin: Negative for rash.  Neurological: Negative for headaches.    Physical Exam Updated Vital Signs BP (!) 69/58 (BP Location: Left Arm)    Pulse (!) 123    Temp 98 F (36.7 C) (Oral)    Resp 16    SpO2  96%   Physical Exam Vitals and nursing note reviewed.  Constitutional:  Appearance: Normal appearance. He is well-developed and well-nourished.  HENT:     Head: Normocephalic and atraumatic.  Eyes:     Conjunctiva/sclera: Conjunctivae normal.  Cardiovascular:     Rate and Rhythm: Regular rhythm. Tachycardia present.     Heart sounds: No murmur heard.   Pulmonary:     Effort: Pulmonary effort is normal. No respiratory distress.     Breath sounds: Normal breath sounds.  Abdominal:     Palpations: Abdomen is soft.     Tenderness: There is no abdominal tenderness.  Musculoskeletal:        General: No edema.     Cervical back: Neck supple.     Right lower leg: No edema.     Left lower leg: No edema.     Comments: Fistula left forearm with positive thrill  Skin:    General: Skin is warm and dry.  Neurological:     General: No focal deficit present.     Mental Status: He is alert.  Psychiatric:        Mood and Affect: Mood and affect normal.     ED Results / Procedures / Treatments   Labs (all labs ordered are listed, but only abnormal results are displayed) Labs Reviewed  CBC - Abnormal; Notable for the following components:      Result Value   RBC 4.04 (*)    Hemoglobin 12.2 (*)    RDW 18.6 (*)    All other components within normal limits  BASIC METABOLIC PANEL - Abnormal; Notable for the following components:   Chloride 91 (*)    BUN 27 (*)    Creatinine, Ser 6.38 (*)    GFR, Estimated 9 (*)    Anion gap 16 (*)    All other components within normal limits  LACTIC ACID, PLASMA - Abnormal; Notable for the following components:   Lactic Acid, Venous 2.8 (*)    All other components within normal limits  LACTIC ACID, PLASMA - Abnormal; Notable for the following components:   Lactic Acid, Venous 2.3 (*)    All other components within normal limits  APTT - Abnormal; Notable for the following components:   aPTT 53 (*)    All other components within normal limits   BASIC METABOLIC PANEL - Abnormal; Notable for the following components:   Sodium 134 (*)    Chloride 94 (*)    BUN 29 (*)    Creatinine, Ser 7.28 (*)    GFR, Estimated 8 (*)    All other components within normal limits  CBC - Abnormal; Notable for the following components:   RBC 3.44 (*)    Hemoglobin 10.3 (*)    HCT 33.1 (*)    RDW 18.8 (*)    All other components within normal limits  HEPATIC FUNCTION PANEL - Abnormal; Notable for the following components:   Albumin 3.3 (*)    AST 10 (*)    All other components within normal limits  PROTIME-INR - Abnormal; Notable for the following components:   Prothrombin Time 20.3 (*)    INR 1.8 (*)    All other components within normal limits  RESP PANEL BY RT-PCR (FLU A&B, COVID) ARPGX2  CULTURE, BLOOD (ROUTINE X 2)  CULTURE, BLOOD (ROUTINE X 2)  C DIFFICILE QUICK SCREEN W PCR REFLEX  GASTROINTESTINAL PANEL BY PCR, STOOL (REPLACES STOOL CULTURE)  MAGNESIUM  PROCALCITONIN  HIV ANTIBODY (ROUTINE TESTING W REFLEX)  TSH  LIPID PANEL    EKG EKG Interpretation  Date/Time:  Wednesday June 04 2020 18:10:55 EST Ventricular Rate:  124 PR Interval:    QRS Duration: 113 QT Interval:  401 QTC Calculation: 576 R Axis:   93 Text Interpretation: Atrial flutter/fibrillation Abnormal R-wave progression, late transition Inferior infarct, age indeterminate Prolonged QT interval increased rate and ST/T wave changes from prior 12/21 Confirmed by Aletta Edouard [phone]) on 06/04/2020 6:21:21 PM   Radiology DG Chest Port 1 View  Result Date: 06/04/2020 CLINICAL DATA:  60 year old male with cough. EXAM: PORTABLE CHEST 1 VIEW COMPARISON:  Chest radiograph dated 04/24/2020. FINDINGS: Minimal bibasilar atelectasis. No focal consolidation, pleural effusion or pneumothorax. There is mild cardiomegaly with mild vascular congestion. Atherosclerotic calcification of the aorta. No acute osseous pathology. IMPRESSION: Cardiomegaly with mild vascular  congestion. No focal consolidation. Electronically Signed   By: Anner Crete M.D.   On: 06/04/2020 18:44    Procedures .Critical Care Performed by: Hayden Rasmussen, MD Authorized by: Hayden Rasmussen, MD   Critical care provider statement:    Critical care time (minutes):  45   Critical care time was exclusive of:  Separately billable procedures and treating other patients   Critical care was necessary to treat or prevent imminent or life-threatening deterioration of the following conditions:  Circulatory failure   Critical care was time spent personally by me on the following activities:  Discussions with consultants, evaluation of patient's response to treatment, examination of patient, ordering and performing treatments and interventions, ordering and review of laboratory studies, ordering and review of radiographic studies, pulse oximetry, re-evaluation of patient's condition, obtaining history from patient or surrogate, review of old charts and development of treatment plan with patient or surrogate   (including critical care time)  Medications Ordered in ED Medications  acetaminophen (TYLENOL) tablet 650 mg (has no administration in time range)    Or  acetaminophen (TYLENOL) suppository 650 mg (has no administration in time range)  ondansetron (ZOFRAN) tablet 4 mg (has no administration in time range)    Or  ondansetron (ZOFRAN) injection 4 mg (has no administration in time range)  metroNIDAZOLE (FLAGYL) IVPB 500 mg (500 mg Intravenous New Bag/Given 06/05/20 0415)  vancomycin (VANCOCIN) IVPB 1000 mg/200 mL premix (has no administration in time range)  ceFEPIme (MAXIPIME) 2 g in sodium chloride 0.9 % 100 mL IVPB (has no administration in time range)  midodrine (PROAMATINE) tablet 10 mg (10 mg Oral Given 06/05/20 0804)  cinacalcet (SENSIPAR) tablet 180 mg (has no administration in time range)  calcium acetate (PHOSLO) capsule 2,001 mg (2,001 mg Oral Not Given 06/05/20 0805)   pantoprazole (PROTONIX) EC tablet 40 mg (40 mg Oral Given 06/05/20 0806)  gabapentin (NEURONTIN) capsule 100 mg (has no administration in time range)  feeding supplement (NEPRO CARB STEADY) liquid 237 mL (237 mLs Oral New Bag/Given 06/05/20 0813)  albuterol (VENTOLIN HFA) 108 (90 Base) MCG/ACT inhaler 2 puff (has no administration in time range)  fluticasone furoate-vilanterol (BREO ELLIPTA) 100-25 MCG/INH 1 puff (1 puff Inhalation Given 06/05/20 0817)  diclofenac Sodium (VOLTAREN) 1 % topical gel 2 g (2 g Topical Given 06/05/20 0813)  sucroferric oxyhydroxide (VELPHORO) chewable tablet 1,000 mg (1,000 mg Oral Given 06/05/20 0805)  Warfarin - Pharmacist Dosing Inpatient (has no administration in time range)  calcium acetate (PHOSLO) capsule 667 mg (has no administration in time range)  sucroferric oxyhydroxide (VELPHORO) chewable tablet 500 mg (has no administration in time range)  guaiFENesin (MUCINEX) 12 hr tablet 600 mg (600 mg Oral Given 06/05/20 0948)  warfarin (COUMADIN)  tablet 3 mg (has no administration in time range)  loperamide (IMODIUM) capsule 2 mg (has no administration in time range)  benzonatate (TESSALON) capsule 200 mg (has no administration in time range)  digoxin (LANOXIN) 0.25 MG/ML injection 0.25 mg (has no administration in time range)  amiodarone (PACERONE) tablet 200 mg (has no administration in time range)  sodium chloride 0.9 % bolus 500 mL (0 mLs Intravenous Stopped 06/04/20 1935)  sodium chloride 0.9 % bolus 500 mL (0 mLs Intravenous Stopped 06/04/20 1844)  lactated ringers bolus 500 mL (0 mLs Intravenous Stopped 06/04/20 2015)  ceFEPIme (MAXIPIME) 2 g in sodium chloride 0.9 % 100 mL IVPB ( Intravenous Stopped 06/04/20 2033)  metroNIDAZOLE (FLAGYL) IVPB 500 mg (0 mg Intravenous Stopped 06/04/20 2138)  lactated ringers bolus 500 mL (0 mLs Intravenous Stopped 06/04/20 2149)  albumin human 25 % solution 25 g (0 g Intravenous Stopped 06/05/20 0131)  vancomycin (VANCOCIN) 2,500 mg  in sodium chloride 0.9 % 500 mL IVPB ( Intravenous Stopped 06/05/20 0059)  warfarin (COUMADIN) tablet 7.5 mg (7.5 mg Oral Given 06/05/20 0416)  lactated ringers bolus 1,000 mL (1,000 mLs Intravenous New Bag/Given 06/05/20 0827)    ED Course  I have reviewed the triage vital signs and the nursing notes.  Pertinent labs & imaging results that were available during my care of the patient were reviewed by me and considered in my medical decision making (see chart for details).  Clinical Course as of 06/05/20 1100  Wed Jun 04, 2020  1910 Lactate coming back elevated at 2.8. No obvious infectious source. I have ordered blood cultures and IV antibiotics. We will proceed with judicious fluid boluses. [MB]  2031 Patient remains awake and alert.  Blood pressures in the 80s.  Heart rate still 1 10-1 20 likely A. fib flutter.  He is agreeable to admission.  Hospitalist has been paged. [MB]  2051 Discussed with Dr. Myna Hidalgo [facility] hospitalist who will evaluate the patient for admission. [MB]    Clinical Course User Index [MB] Hayden Rasmussen, MD   MDM Rules/Calculators/A&P                         This patient complains of tachycardia after dialysis; this involves an extensive number of treatment Options and is a complaint that carries with it a high risk of complications and Morbidity. The differential includes sepsis, shock, hypovolemia, arrhythmia, metabolic disturbance, anemia, GI bleed, infection  I ordered, reviewed and interpreted labs, which included CBC with normal white count, hemoglobin low but baseline, chemistries consistent with end-stage renal disease, lactic acid elevated, blood cultures pending, COVID testing negative I ordered medication IV fluids for patient's hypotension with some improvement I ordered imaging studies which included chest x-ray and I independently    visualized and interpreted imaging which showed cardiomegaly and mild vascular congestion Previous records obtained and  reviewed in epic, patiently recently in the ED for CHF requiring dialysis. I consulted [facility] hospitalist Dr. Myna Hidalgo and discussed lab and imaging findings  Critical Interventions: Management for patient's hypotension and A. fib with RVR with IV fluids  After the interventions stated above, I reevaluated the patient and found patient to be minimally symptomatic although still tachycardic and hypotensive.  With elevated lactate have escalated and started on IV antibiotics for possible sepsis.  Will need to be admitted to the hospital for continued management.  CHA2DS2/VAS Stroke Risk Points  Current as of 15 minutes ago     4 >=  2 Points: High Risk  1 - 1.99 Points: Medium Risk  0 Points: Low Risk    Last Change: N/A      Details    This score determines the patient's risk of having a stroke if the  patient has atrial fibrillation.       Points Metrics  1 Has Congestive Heart Failure:  Yes    Current as of 15 minutes ago  1 Has Vascular Disease:  Yes    Current as of 15 minutes ago  1 Has Hypertension:  Yes    Current as of 15 minutes ago  0 Age:  11    Current as of 15 minutes ago  1 Has Diabetes:  Yes    Current as of 15 minutes ago  0 Had Stroke:  No  Had TIA:  No  Had Thromboembolism:  No    Current as of 15 minutes ago  0 Male:  No    Current as of 15 minutes ago           Final Clinical Impression(s) / ED Diagnoses Final diagnoses:  Hypotension, unspecified hypotension type  Atrial flutter with rapid ventricular response (HCC)  Sepsis, due to unspecified organism, unspecified whether acute organ dysfunction present [facility])    Rx / DC Orders ED Discharge Orders    None       Hayden Rasmussen, MD 06/05/20 1108

## 2020-06-04 NOTE — Progress Notes (Addendum)
A consult was received from an ED physician for vancomycin & cefepime per pharmacy dosing.  The patient's profile has been reviewed for ht/wt/allergies/indication/available labs.   A one time order has been placed for vancomycin 2500 mg, cefepime 2 gm, flagyl 500 mg.    Further antibiotics/pharmacy consults should be ordered by admitting physician if indicated.                       Thank you, Eudelia Bunch, Pharm.D 06/04/2020 7:27 PM

## 2020-06-04 NOTE — ED Notes (Signed)
Dr Melina Copa aware of BP and map 71/39 (51), he will evaluate pt.

## 2020-06-04 NOTE — Progress Notes (Addendum)
ANTICOAGULATION CONSULT NOTE - Initial Consult  Pharmacy Consult for warfarin Indication: atrial fibrillation  Allergies  Allergen Reactions   Ace Inhibitors Itching, Cough and Other (See Comments)    Patient Measurements:    Vital Signs: Temp: 98 F (36.7 C) (01/12 1724) Temp Source: Oral (01/12 1724) BP: 82/48 (01/12 2000) Pulse Rate: 122 (01/12 2000)  Labs: Recent Labs    06/04/20 1741  HGB 12.2*  HCT 39.0  PLT 223  CREATININE 6.38*    CrCl cannot be calculated (Unknown ideal weight.).   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 Doylene Canard follows   Diabetes mellitus    pt. states he's borderline diabetic., no longer taking med,- off med. since 2013   Dialysis patient [facility])    Mon-Wed-Fri([place])- Left AV fistula   Diverticulitis November 2016   reoccurred in December 2016   Diverticulitis of intestine without perforation or abscess without bleeding 04/21/2015   DVT (deep venous thrombosis) (HCC)    ESRD (end stage renal disease) ([place])    GERD (gastroesophageal reflux disease)    Gout    Hemorrhage of left kidney 01/13/2018   History of nephrectomy 07/04/2012   History of right nephrectomy in 2002 for renal cell carcinoma    History of unilateral nephrectomy    Hypertension    Low iron    Myocardial infarction [facility]) ?2006   Renal cell carcinoma    dialysis- [facility]- [address]- Dr. Mercy Moore follows.   Renal insufficiency    Shortness of breath 05/19/11   "at rest, lying down, w/exertion"   Stroke [facility])  02/2011   05/19/11 denies residual   Umbilical hernia 37/62/83   unrepaired   Wears glasses     Medications: Warfarin 7.5 mg PO daily PTA Last dose: 06/03/20 PTA  Assessment: Pt is a [name] presenting with hypotension. Pt is taking warfarin PTA for atrial fibrillation.   Today, 06/04/20  CBC: Hgb (12.2) slightly low; Plt WNL  SCr 6.38 - pt with ESRD on HD  INR 1.8  DDI: Pt started on flagyl, significant DDI with warfarin resulting in enhanced effects of warfarin  Goal of Therapy:  INR 2-3 Monitor platelets by anticoagulation protocol: Yes   Plan:  Warfarin 7.5mg  po x 1 Daily INR  Dolly Rias RPh 06/05/2020, 1:32 AM

## 2020-06-05 ENCOUNTER — Encounter (HOSPITAL_COMMUNITY): Payer: Self-pay | Admitting: Family Medicine

## 2020-06-05 ENCOUNTER — Inpatient Hospital Stay (HOSPITAL_COMMUNITY): Payer: Medicare Other

## 2020-06-05 LAB — GASTROINTESTINAL PANEL BY PCR, STOOL (REPLACES STOOL CULTURE)

## 2020-06-05 LAB — C DIFFICILE QUICK SCREEN W PCR REFLEX
C Diff antigen: NEGATIVE
C Diff interpretation: NOT DETECTED
C Diff toxin: NEGATIVE

## 2020-06-05 LAB — HEPATIC FUNCTION PANEL
ALT: 9 U/L (ref 0–44)
AST: 10 U/L — ABNORMAL LOW (ref 15–41)
Albumin: 3.3 g/dL — ABNORMAL LOW (ref 3.5–5.0)
Alkaline Phosphatase: 104 U/L (ref 38–126)
Bilirubin, Direct: <0.1 mg/dL (ref 0.0–0.2)
Total Bilirubin: 0.8 mg/dL (ref 0.3–1.2)
Total Protein: 7.9 g/dL (ref 6.5–8.1)

## 2020-06-05 LAB — CBC
HCT: 33.1 % — ABNORMAL LOW (ref 39.0–52.0)
Hemoglobin: 10.3 g/dL — ABNORMAL LOW (ref 13.0–17.0)
MCH: 29.9 pg (ref 26.0–34.0)
MCHC: 31.1 g/dL (ref 30.0–36.0)
MCV: 96.2 fL (ref 80.0–100.0)
Platelets: 173 10*3/uL (ref 150–400)
RBC: 3.44 MIL/uL — ABNORMAL LOW (ref 4.22–5.81)
RDW: 18.8 % — ABNORMAL HIGH (ref 11.5–15.5)
WBC: 6.4 10*3/uL (ref 4.0–10.5)
nRBC: 0 % (ref 0.0–0.2)

## 2020-06-05 LAB — BASIC METABOLIC PANEL
Anion gap: 14 (ref 5–15)
BUN: 29 mg/dL — ABNORMAL HIGH (ref 6–20)
CO2: 26 mmol/L (ref 22–32)
Calcium: 9.3 mg/dL (ref 8.9–10.3)
Chloride: 94 mmol/L — ABNORMAL LOW (ref 98–111)
Creatinine, Ser: 7.28 mg/dL — ABNORMAL HIGH (ref 0.61–1.24)
GFR, Estimated: 8 mL/min — ABNORMAL LOW (ref >60)
Glucose, Bld: 90 mg/dL (ref 70–99)
Potassium: 4.2 mmol/L (ref 3.5–5.1)
Sodium: 134 mmol/L — ABNORMAL LOW (ref 135–145)

## 2020-06-05 LAB — TSH: TSH: 1.591 u[IU]/mL (ref 0.350–4.500)

## 2020-06-05 LAB — APTT: aPTT: 53 seconds — ABNORMAL HIGH (ref 24–36)

## 2020-06-05 LAB — LIPID PANEL
Cholesterol: 115 mg/dL (ref 0–200)
HDL: 42 mg/dL (ref >40)
LDL Cholesterol: 52 mg/dL (ref 0–99)
Total CHOL/HDL Ratio: 2.7 RATIO
Triglycerides: 105 mg/dL (ref <150)
VLDL: 21 mg/dL (ref 0–40)

## 2020-06-05 LAB — LACTIC ACID, PLASMA: Lactic Acid, Venous: 2.3 mmol/L (ref 0.5–1.9)

## 2020-06-05 LAB — PROTIME-INR
INR: 1.8 — ABNORMAL HIGH (ref 0.8–1.2)
Prothrombin Time: 20.3 seconds — ABNORMAL HIGH (ref 11.4–15.2)

## 2020-06-05 LAB — HIV ANTIBODY (ROUTINE TESTING W REFLEX): HIV Screen 4th Generation wRfx: NONREACTIVE

## 2020-06-05 LAB — PROCALCITONIN: Procalcitonin: 0.88 ng/mL

## 2020-06-05 MED ORDER — GUAIFENESIN ER 600 MG PO TB12
600.0000 mg | ORAL_TABLET | Freq: Two times a day (BID) | ORAL | Status: DC
  Administered 2020-06-05 – 2020-06-09 (×9): 600 mg via ORAL
  Filled 2020-06-05 (×10): qty 1

## 2020-06-05 MED ORDER — WARFARIN SODIUM 7.5 MG PO TABS
7.5000 mg | ORAL_TABLET | Freq: Once | ORAL | Status: AC
  Administered 2020-06-05: 7.5 mg via ORAL
  Filled 2020-06-05 (×2): qty 1

## 2020-06-05 MED ORDER — BENZONATATE 100 MG PO CAPS
200.0000 mg | ORAL_CAPSULE | Freq: Three times a day (TID) | ORAL | Status: DC
Start: 2020-06-05 — End: 2020-06-09
  Administered 2020-06-05 – 2020-06-09 (×13): 200 mg via ORAL
  Filled 2020-06-05 (×13): qty 2

## 2020-06-05 MED ORDER — LOPERAMIDE HCL 2 MG PO CAPS
2.0000 mg | ORAL_CAPSULE | Freq: Four times a day (QID) | ORAL | Status: AC
  Administered 2020-06-05 – 2020-06-07 (×8): 2 mg via ORAL
  Filled 2020-06-05 (×8): qty 1

## 2020-06-05 MED ORDER — LACTATED RINGERS IV BOLUS
1000.0000 mL | Freq: Once | INTRAVENOUS | Status: AC
  Administered 2020-06-05: 1000 mL via INTRAVENOUS

## 2020-06-05 MED ORDER — SUCROFERRIC OXYHYDROXIDE 500 MG PO CHEW
500.0000 mg | CHEWABLE_TABLET | ORAL | Status: DC | PRN
  Filled 2020-06-05: qty 1

## 2020-06-05 MED ORDER — WARFARIN SODIUM 3 MG PO TABS
3.0000 mg | ORAL_TABLET | Freq: Once | ORAL | Status: AC
  Administered 2020-06-05: 3 mg via ORAL
  Filled 2020-06-05: qty 1

## 2020-06-05 MED ORDER — CALCIUM ACETATE (PHOS BINDER) 667 MG PO CAPS: 667.0000 mg | ORAL_CAPSULE | ORAL | Status: DC | PRN

## 2020-06-05 MED ORDER — DIGOXIN 0.25 MG/ML IJ SOLN
0.2500 mg | Freq: Every day | INTRAMUSCULAR | Status: DC
  Administered 2020-06-05: 0.25 mg via INTRAVENOUS
  Filled 2020-06-05: qty 2

## 2020-06-05 MED ORDER — WARFARIN - PHARMACIST DOSING INPATIENT: Freq: Every day | Status: DC

## 2020-06-05 MED ORDER — AMIODARONE HCL 200 MG PO TABS
200.0000 mg | ORAL_TABLET | Freq: Two times a day (BID) | ORAL | Status: DC
  Administered 2020-06-05 – 2020-06-09 (×8): 200 mg via ORAL
  Filled 2020-06-05 (×9): qty 1

## 2020-06-05 NOTE — Progress Notes (Signed)
  Echocardiogram 2D Echocardiogram has been performed.  [name] 06/05/2020, 2:38 PM

## 2020-06-05 NOTE — Consult Note (Signed)
Referring Physician: Terrilee Croak, MD   [name] is an 60 y.o. male.                       Chief Complaint: Atrial flutter/fibrillation  HPI: 60 years old black male with dilated cardiomyopathy, ESRD with dialysis on M-W-F, Anemia of blood loss, paroxysmal atrial flutter, type 2 DM and Obesity has 2 weeks h/o diarrhea, low blood pressure, dizziness and palpitations. His EKG shows atrial flutter with RVR. His INR is 1.8 on Warfarin. He has poor oral intake for last few days. He denies chest pain, fever or cough. Echocardiogram is pending. CXR shows cardiomegaly with mild vascular congestion.  Past Medical History:  Diagnosis Date  . Acute lower GI bleeding 06/21/2018  . Acute respiratory failure with hypoxia ([place]) 01/13/2015  . Atrial flutter ([place])   . Bile leak, postoperative 03/15/2016  . Biliary dyskinesia 02/12/2016  . 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 Cardiomyopathy   . CHF (congestive heart failure) ([place])   . Coronary artery disease    normal coronaries by 10/10/08 cath, Cardiac Cath 08-04-12 epic.Dr. Doylene Canard follows  . Diabetes mellitus    pt. states he's borderline diabetic., no longer taking med,- off med. since 2013  . Dialysis patient [facility])    Mon-Wed-Fri([place])- Left AV fistula  . Diverticulitis November 2016   reoccurred in December 2016  . Diverticulitis of intestine without perforation or abscess without bleeding 04/21/2015  . DVT (deep venous thrombosis) ([place])   . ESRD (end stage renal disease) ([place])   . GERD (gastroesophageal reflux disease)   . Gout   . Hemorrhage of left kidney 01/13/2018  . History of nephrectomy 07/04/2012   History of right nephrectomy in 2002 for renal cell carcinoma   . History of unilateral nephrectomy   . Hypertension   . Low iron   . Myocardial infarction [facility]) ?2006  . Renal cell carcinoma    dialysis- MWF- [address]- Dr. Mercy Moore follows.  . Renal insufficiency   . Shortness of  breath 05/19/11   "at rest, lying down, w/exertion"  . Stroke [facility]) 02/2011   05/19/11 denies residual  . Umbilical hernia 64/15/83   unrepaired  . Wears glasses       Past Surgical History:  Procedure Laterality Date  . ANAL FISTULOTOMY  08/15/2018  . AV FISTULA PLACEMENT  03/29/2011   Procedure: ARTERIOVENOUS (AV) FISTULA CREATION;  Surgeon: Hinda Lenis, MD;  Location: [place];  Service: Vascular;  Laterality: Left;  LEFT RADIOCEPHALIC , Arteriovenous [id number])  . CARDIAC CATHETERIZATION  05/21/11  . CARDIAC CATHETERIZATION N/A 03/16/2016   Procedure: Left Heart Cath and Coronary Angiography;  Surgeon: [place]ie Dials, MD;  Location: [place] CV LAB;  Service: Cardiovascular;  Laterality: N/A;  . CHOLECYSTECTOMY N/A 02/12/2016   Procedure: LAPAROSCOPIC CHOLECYSTECTOMY WITH INTRAOPERATIVE CHOLANGIOGRAM;  Surgeon: Jackolyn Confer, MD;  Location: [place];  Service: General;  Laterality: N/A;  . COLONOSCOPY WITH PROPOFOL N/A 02/01/2017   Procedure: COLONOSCOPY WITH PROPOFOL;  Surgeon: Juanita Craver, MD;  [facility] ENDOSCOPY;  Service: Endoscopy;  Laterality: N/A;  . dialysis cath placed    . ESOPHAGOGASTRODUODENOSCOPY (EGD) WITH PROPOFOL N/A 12/02/2015   Procedure: ESOPHAGOGASTRODUODENOSCOPY (EGD) WITH PROPOFOL;  Surgeon: Juanita Craver, MD;  [facility];  Service: Endoscopy;  Laterality: N/A;  . EVALUATION UNDER ANESTHESIA WITH HEMORRHOIDECTOMY N/A 08/15/2018   Procedure: HEMORRHOID LIGATION/PEXY;  Surgeon: Michael Boston, MD;  [facility] OR;  Service: General;  Laterality: N/A;  . FINGER SURGERY     L pinkie finger- ORIF- /w remaining hardware - 1990's    . FISTULOTOMY N/A 08/15/2018   Procedure: SUPERFICIAL ANAL FISTULOTOMY;  Surgeon: Michael Boston, MD;  Location: [place];  Service: General;  Laterality: N/A;  . HEMORRHOID SURGERY    . HERNIA REPAIR N/A 7/56/4332   Umbilical hernia repair  . INSERTION OF DIALYSIS CATHETER N/A 01/27/2016   Procedure: INSERTION OF DIALYSIS  CATHETER;  Surgeon: Waynetta Sandy, MD;  Location: [place];  Service: Vascular;  Laterality: N/A;  . INSERTION OF MESH N/A 07/04/2012   Procedure: INSERTION OF MESH;  Surgeon: Madilyn Hook, DO;  Location: [place];  Service: General;  Laterality: N/A;  . IR EMBO ART  VEN HEMORR LYMPH EXTRAV  INC GUIDE ROADMAPPING  01/13/2018  . IR FLUORO GUIDE CV LINE RIGHT  01/13/2018  . IR IVC FILTER PLMT / S&I /IMG GUID/MOD SED  01/27/2018  . IR RENAL SELECTIVE  UNI INC S&I MOD SED  01/13/2018  . IR US GUIDE VASC ACCESS RIGHT  01/13/2018  . KIDNEY CYST REMOVAL  2019  . LAPAROSCOPIC LYSIS OF ADHESIONS N/A 02/12/2016   Procedure: LAPAROSCOPIC LYSIS OF ADHESIONS;  Surgeon: Jackolyn Confer, MD;  Location: [place];  Service: General;  Laterality: N/A;  . LEFT AND RIGHT HEART CATHETERIZATION WITH CORONARY ANGIOGRAM N/A 08/04/2012   Procedure: LEFT AND RIGHT HEART CATHETERIZATION WITH CORONARY ANGIOGRAM;  Surgeon: Birdie Riddle, MD;  Location: [place] CATH LAB;  Service: Cardiovascular;  Laterality: N/A;  . NEPHRECTOMY  2000   right  . REVISON OF ARTERIOVENOUS FISTULA Left 06/05/2013   Procedure: REVISON OF LEFT RADIOCEPHALIC  ARTERIOVENOUS FISTULA;  Surgeon: Conrad Sperry, MD;  Location: [place];  Service: Vascular;  Laterality: Left;  . REVISON OF ARTERIOVENOUS FISTULA Left 01/27/2016   Procedure: REVISION OF LEFT UPPER EXTREMITY ARTERIOVENOUS FISTULA;  Surgeon: Waynetta Sandy, MD;  Location: [place];  Service: Vascular;  Laterality: Left;  . REVISON OF ARTERIOVENOUS FISTULA Left 08/03/2016   Procedure: REVISON OF Left arm ARTERIOVENOUS FISTULA;  Surgeon: Elam Dutch, MD;  Location: [place];  Service: Vascular;  Laterality: Left;  . REVISON OF ARTERIOVENOUS FISTULA Left 05/06/2017   Procedure: REVISION PLICATION OF ARTERIOVENOUS FISTULA LEFT ARM;  Surgeon: Rosetta Posner, MD;  Location: [place];  Service: Vascular;  Laterality: Left;  . REVISON OF ARTERIOVENOUS FISTULA Left 02/01/2019   Procedure: REVISION OF  ARTERIOVENOUS FISTULA LEFT ARM;  Surgeon: [name], MD;  Location: [place];  Service: Vascular;  Laterality: Left;  . REVISON OF ARTERIOVENOUS FISTULA Left 03/15/2019   Procedure: REVISION PLICATION OF ARTERIOVENOUS FISTULA LEFT ARM;  Surgeon: [name], MD;  Location: [place];  Service: Vascular;  Laterality: Left;  . RIGHT HEART CATHETERIZATION N/A 05/21/2011   Procedure: RIGHT HEART CATH;  Surgeon: Birdie Riddle, MD;  Location: [facility] CATH LAB;  Service: Cardiovascular;  Laterality: N/A;  . smashed  1990's   "left pinky; have a plate in there"  . UMBILICAL HERNIA REPAIR N/A 07/04/2012   Procedure: LAPAROSCOPIC UMBILICAL HERNIA;  Surgeon: Madilyn Hook, DO;  Location: [place];  Service: General;  Laterality: N/A;  . US ECHOCARDIOGRAPHY  05/20/11    Family History  Problem Relation Age of Onset  . Hypertension Mother   . Kidney disease Mother    Social History:  reports that he has never smoked. He has never used smokeless tobacco. He reports that  he does not drink alcohol and does not use drugs.  Allergies:  Allergies  Allergen Reactions  . Ace Inhibitors Itching, Cough and Other (See Comments)    Medications Prior to Admission  Medication Sig Dispense Refill  . albuterol (VENTOLIN HFA) 108 (90 Base) MCG/ACT inhaler Inhale 2 puffs into the lungs every 6 (six) hours as needed for wheezing or shortness of breath. 18 g 2  . calcium acetate (PHOSLO) 667 MG capsule Take 1-3 capsules (667-2,001 mg total) by mouth See admin instructions. Take 2001 mg capsules with meals three times daily and 667 mg capsule with snacks. (Patient taking differently: Take 2,001 mg by mouth daily.)    . chlorhexidine (PERIDEX) 0.12 % solution 15 mLs by Mouth Rinse route 2 (two) times daily.    . cinacalcet (SENSIPAR) 90 MG tablet Take 180 mg by mouth daily.    . diclofenac Sodium (VOLTAREN) 1 % GEL Apply 2 g topically 4 (four) times daily. 100 g 2  . ferric citrate (AURYXIA) 1 GM 210 MG(Fe) tablet  Take 630 mg by mouth 3 (three) times daily with meals.    . fluticasone furoate-vilanterol (BREO ELLIPTA) 100-25 MCG/INH AEPB Inhale 1 puff into the lungs 2 (two) times daily. (Patient taking differently: Inhale 1 puff into the lungs 2 (two) times daily as needed (wheezing).) 60 each 1  . gabapentin (NEURONTIN) 100 MG capsule Take 1 capsule (100 mg total) by mouth 3 (three) times daily as needed (coughing uncontrollably). 90 capsule 1  . HYDROcodone-homatropine (HYDROMET) 5-1.5 MG/5ML syrup Take 5 mLs by mouth every 6 (six) hours as needed. (Patient taking differently: Take 5 mLs by mouth every 6 (six) hours as needed for cough.) 120 mL 0  . ipratropium (ATROVENT) 0.06 % nasal spray Place 2 sprays into both nostrils 4 (four) times daily. (Patient taking differently: Place 2 sprays into both nostrils 4 (four) times daily as needed for rhinitis.) 15 mL 12  . lidocaine-prilocaine (EMLA) cream Apply 1 application topically See admin instructions. Apply topically to port access prior to dialysis - Monday, Wednesday, Friday.  12  . midodrine (PROAMATINE) 10 MG tablet Take 10 mg by mouth 3 (three) times daily.    . multivitamin (RENA-VIT) TABS tablet Take 1 tablet by mouth at bedtime.    . nitroGLYCERIN (NITROSTAT) 0.4 MG SL tablet Place 0.4 mg under the tongue every 5 (five) minutes as needed for chest pain.   0  . Nutritional Supplements (FEEDING SUPPLEMENT, NEPRO CARB STEADY,) LIQD Take 237 mLs by mouth daily.    . ondansetron (ZOFRAN ODT) 4 MG disintegrating tablet Take 1 tablet (4 mg total) by mouth every 8 (eight) hours as needed for nausea or vomiting. 20 tablet 0  . oxyCODONE-acetaminophen (PERCOCET/ROXICET) 5-325 MG tablet Take 1 tablet by mouth every 4 (four) hours as needed for severe pain.    . pantoprazole (PROTONIX) 40 MG tablet Take 40 mg by mouth 2 (two) times daily.    . sucroferric oxyhydroxide (VELPHORO) 500 MG chewable tablet Chew 500-1,000 mg by mouth See admin instructions. Takes 2  tablets with each meal and 1 tablet with snacks.    . warfarin (COUMADIN) 7.5 MG tablet Take 7.5 mg by mouth daily.    Marland Kitchen allopurinol (ZYLOPRIM) 100 MG tablet Take 1 tablet (100 mg total) by mouth daily. (Patient not taking: No sig reported) 30 tablet 3  . UNABLE TO FIND Out patient physical therapy- vestibular therapy for BPPV 1 each 0    Results for orders placed or  performed during the hospital encounter of 06/04/20 (from the past 48 hour(s))  Protime-INR     Status: Abnormal   Collection Time: 06/04/20  5:30 PM  Result Value Ref Range   Prothrombin Time 20.3 (H) 11.4 - 15.2 seconds   INR 1.8 (H) 0.8 - 1.2    Comment: (NOTE) INR goal varies based on device and disease states. Performed at [facility], [place] [address], [place], [place] [address]   Magnesium     Status: None   Collection Time: 06/04/20  5:39 PM  Result Value Ref Range   Magnesium 2.3 1.7 - 2.4 mg/dL    Comment: Performed at [facility], [place] [address], [place], [place] [address]  CBC     Status: Abnormal   Collection Time: 06/04/20  5:41 PM  Result Value Ref Range   WBC 7.0 4.0 - 10.5 K/uL   RBC 4.04 (L) 4.22 - 5.81 MIL/uL   Hemoglobin 12.2 (L) 13.0 - 17.0 g/dL   HCT 39.0 39.0 - 52.0 %   MCV 96.5 80.0 - 100.0 fL   MCH 30.2 26.0 - 34.0 pg   MCHC 31.3 30.0 - 36.0 g/dL   RDW 18.6 (H) 11.5 - 15.5 %   Platelets 223 150 - 400 K/uL   nRBC 0.0 0.0 - 0.2 %    Comment: Performed at [facility], [place] [address] [place], [place] [address]  Basic metabolic panel     Status: Abnormal   Collection Time: 06/04/20  5:41 PM  Result Value Ref Range   Sodium 135 135 - 145 mmol/L   Potassium 4.5 3.5 - 5.1 mmol/L   Chloride 91 (L) 98 - 111 mmol/L   CO2 28 22 - 32 mmol/L   Glucose, Bld 88 70 - 99 mg/dL    Comment: Glucose reference range applies only to samples taken after fasting for at least 8 hours.   BUN 27 (H) 6 - 20 mg/dL   Creatinine, Ser 6.38 (H) 0.61 - 1.24  mg/dL   Calcium 9.5 8.9 - 10.3 mg/dL   GFR, Estimated 9 (L) >60 mL/min    Comment: (NOTE) Calculated using the CKD-EPI Creatinine Equation (2021)    Anion gap 16 (H) 5 - 15    Comment: Performed at [facility], [place] [address], [place], [place] [address]  Lactic acid, plasma     Status: Abnormal   Collection Time: 06/04/20  5:41 PM  Result Value Ref Range   Lactic Acid, Venous 2.8 (HH) 0.5 - 1.9 mmol/L    Comment: CRITICAL RESULT CALLED TO, READ BACK BY AND VERIFIED WITH: SIMPSON,C. RN @1905  ON 01.12.2022 BY COHEN,K Performed at [facility], [place] [address], [place], [place] [address]   Resp Panel by RT-PCR (Flu A&B, Covid) Nasopharyngeal Swab     Status: None   Collection Time: 06/04/20  7:39 PM   Specimen: Nasopharyngeal Swab; Nasopharyngeal(NP) swabs in vial transport medium  Result Value Ref Range   SARS Coronavirus 2 by RT PCR NEGATIVE NEGATIVE    Comment: (NOTE) SARS-CoV-2 target nucleic acids are NOT DETECTED.  The SARS-CoV-2 RNA is generally detectable in upper respiratory specimens during the acute phase of infection. The lowest concentration of SARS-CoV-2 viral copies this assay can detect is 138 copies/mL. A negative result does not preclude SARS-Cov-2 infection and should not be used as the sole basis for treatment or other patient management decisions. A negative result may occur with  improper specimen  collection/handling, submission of specimen other than nasopharyngeal swab, presence of viral mutation(s) within the areas targeted by this assay, and inadequate number of viral copies(<138 copies/mL). A negative result must be combined with clinical observations, patient history, and epidemiological information. The expected result is Negative.  Fact Sheet for Patients:  EntrepreneurPulse.com.au  Fact Sheet for Healthcare Providers:  IncredibleEmployment.be  This test is no t yet approved  or cleared by the [place] FDA and  has been authorized for detection and/or diagnosis of SARS-CoV-2 by FDA under an Emergency Use Authorization (EUA). This EUA will remain  in effect (meaning this test can be used) for the duration of the COVID-19 declaration under Section 564(b)(1) of the Act, 21 U.S.C.section 360bbb-3(b)(1), unless the authorization is terminated  or revoked sooner.       Influenza A by PCR NEGATIVE NEGATIVE   Influenza B by PCR NEGATIVE NEGATIVE    Comment: (NOTE) The Xpert Xpress SARS-CoV-2/FLU/RSV plus assay is intended as an aid in the diagnosis of influenza from Nasopharyngeal swab specimens and should not be used as a sole basis for treatment. Nasal washings and aspirates are unacceptable for Xpert Xpress SARS-CoV-2/FLU/RSV testing.  Fact Sheet for Patients: EntrepreneurPulse.com.au  Fact Sheet for Healthcare Providers: IncredibleEmployment.be  This test is not yet approved or cleared by the [place] FDA and has been authorized for detection and/or diagnosis of SARS-CoV-2 by FDA under an Emergency Use Authorization (EUA). This EUA will remain in effect (meaning this test can be used) for the duration of the COVID-19 declaration under Section 564(b)(1) of the Act, 21 U.S.C. section 360bbb-3(b)(1), unless the authorization is terminated or revoked.  Performed at [facility], [place] [address] [place], [place] [address]   Culture, blood (routine x 2)     Status: None (Preliminary result)   Collection Time: 06/04/20  7:39 PM   Specimen: BLOOD RIGHT FOREARM  Result Value Ref Range   Specimen Description      BLOOD RIGHT FOREARM Performed at [place] [address], [place], [place] [address]    Special Requests      BOTTLES DRAWN AEROBIC AND ANAEROBIC Blood Culture adequate volume Performed at [place] [address] [place], [place]  [address]    Culture      NO GROWTH < 12 HOURS Performed at [place] [address] [place], [place] [address]    Report Status PENDING   Culture, blood (routine x 2)     Status: None (Preliminary result)   Collection Time: 06/04/20  7:39 PM   Specimen: BLOOD RIGHT HAND  Result Value Ref Range   Specimen Description      BLOOD RIGHT HAND Performed at [place] [address], [place], [place] [address]    Special Requests      BOTTLES DRAWN AEROBIC AND ANAEROBIC Blood Culture results may not be optimal due to an inadequate volume of blood received in culture bottles Performed at [facility], [place] [address] [place], [place] [address]    Culture      NO GROWTH < 12 HOURS Performed at [place] [address] [place], [place] [address]    Report Status PENDING   Lactic acid, plasma     Status: Abnormal   Collection Time: 06/05/20  1:14 AM  Result Value Ref Range   Lactic Acid, Venous 2.3 (HH) 0.5 - 1.9 mmol/L    Comment: CRITICAL VALUE NOTED.  VALUE IS CONSISTENT WITH PREVIOUSLY REPORTED AND CALLED VALUE. Performed at [facility], [place] [address] [place], [place] [address]   APTT     Status: Abnormal   Collection Time: 06/05/20  1:14 AM  Result Value Ref Range   aPTT 53 (H) 24 - 36 seconds    Comment:        IF BASELINE aPTT IS ELEVATED, SUGGEST PATIENT RISK ASSESSMENT BE USED TO DETERMINE APPROPRIATE ANTICOAGULANT THERAPY. Performed at [facility], [place] [address], [place], [place] [address]   Hepatic function panel     Status: Abnormal   Collection Time: 06/05/20  1:14 AM  Result Value Ref Range   Total Protein 7.9 6.5 - 8.1 g/dL   Albumin 3.3 (L) 3.5 - 5.0 g/dL   AST 10 (L) 15 - 41 U/L   ALT 9 0 - 44 U/L   Alkaline Phosphatase 104 38 - 126 U/L   Total Bilirubin 0.8 0.3 - 1.2 mg/dL   Bilirubin, Direct <0.1 0.0 - 0.2 mg/dL   Indirect Bilirubin NOT CALCULATED 0.3 -  0.9 mg/dL    Comment: Performed at [facility], [place] [address], [place], [place] [address]  Procalcitonin - Baseline     Status: None   Collection Time: 06/05/20  1:14 AM  Result Value Ref Range   Procalcitonin 0.88 ng/mL    Comment:        Interpretation: PCT > 0.5 ng/mL and <= 2 ng/mL: Systemic infection (sepsis) is possible, but other conditions are known to elevate PCT as well. (NOTE)       Sepsis PCT Algorithm           Lower Respiratory Tract                                      Infection PCT Algorithm    ----------------------------     ----------------------------         PCT < 0.25 ng/mL                PCT < 0.10 ng/mL          Strongly encourage             Strongly discourage   discontinuation of antibiotics    initiation of antibiotics    ----------------------------     -----------------------------       PCT 0.25 - 0.50 ng/mL            PCT 0.10 - 0.25 ng/mL               OR       >80% decrease in PCT            Discourage initiation of                                            antibiotics      Encourage discontinuation           of antibiotics    ----------------------------     -----------------------------         PCT >= 0.50 ng/mL              PCT 0.26 - 0.50 ng/mL  AND       <80% decrease in PCT             Encourage initiation of                                             antibiotics       Encourage continuation           of antibiotics    ----------------------------     -----------------------------        PCT >= 0.50 ng/mL                  PCT > 0.50 ng/mL               AND         increase in PCT                  Strongly encourage                                      initiation of antibiotics    Strongly encourage escalation           of antibiotics                                     -----------------------------                                           PCT <= 0.25 ng/mL                                                  OR                                        > 80% decrease in PCT                                      Discontinue / Do not initiate                                             antibiotics  Performed at [place] [address], [place], [place] [address]   Basic metabolic panel     Status: Abnormal   Collection Time: 06/05/20  6:13 AM  Result Value Ref Range   Sodium 134 (L) 135 - 145 mmol/L   Potassium 4.2 3.5 - 5.1 mmol/L   Chloride 94 (L) 98 - 111 mmol/L   CO2 26 22 - 32 mmol/L   Glucose, Bld 90 70 - 99 mg/dL    Comment: Glucose reference range applies only to samples taken after fasting for at least 8 hours.  BUN 29 (H) 6 - 20 mg/dL   Creatinine, Ser 7.28 (H) 0.61 - 1.24 mg/dL   Calcium 9.3 8.9 - 10.3 mg/dL   GFR, Estimated 8 (L) >60 mL/min    Comment: (NOTE) Calculated using the CKD-EPI Creatinine Equation (2021)    Anion gap 14 5 - 15    Comment: Performed at [place] [address] [place], [place] [address]  CBC     Status: Abnormal   Collection Time: 06/05/20  6:13 AM  Result Value Ref Range   WBC 6.4 4.0 - 10.5 K/uL   RBC 3.44 (L) 4.22 - 5.81 MIL/uL   Hemoglobin 10.3 (L) 13.0 - 17.0 g/dL   HCT 33.1 (L) 39.0 - 52.0 %   MCV 96.2 80.0 - 100.0 fL   MCH 29.9 26.0 - 34.0 pg   MCHC 31.1 30.0 - 36.0 g/dL   RDW 18.8 (H) 11.5 - 15.5 %   Platelets 173 150 - 400 K/uL   nRBC 0.0 0.0 - 0.2 %    Comment: Performed at [facility], [place] [address] [place], [place] [address]   DG Chest Port 1 View  Result Date: 06/04/2020 CLINICAL DATA:  60 year old male with cough. EXAM: PORTABLE CHEST 1 VIEW COMPARISON:  Chest radiograph dated 04/24/2020. FINDINGS: Minimal bibasilar atelectasis. No focal consolidation, pleural effusion or pneumothorax. There is mild cardiomegaly with mild vascular congestion. Atherosclerotic calcification of the aorta. No acute osseous pathology. IMPRESSION: Cardiomegaly with mild vascular congestion. No focal  consolidation. Electronically Signed   By: Anner Crete M.D.   On: 06/04/2020 18:44    Review Of Systems Constitutional: No fever, chills, Chronic weight gain. Eyes: No vision change, wears glasses. No discharge or pain. Ears: No hearing loss, No tinnitus. Respiratory: No asthma, COPD, pneumonias. Positive shortness of breath. No hemoptysis. Cardiovascular: Positive chest pain, palpitation, leg edema. Gastrointestinal: Positive nausea, vomiting, diarrhea, no constipation. No GI bleed. No hepatitis. Genitourinary: No dysuria, hematuria, kidney stone. No incontinance. He has ESRD. Neurological: No headache, stroke, seizures.  Psychiatry: No psych facility admission for anxiety, depression, suicide. No detox. Skin: No rash. Musculoskeletal: Positive joint pain, fibromyalgia. No neck pain, back pain. Lymphadenopathy: No lymphadenopathy. Hematology: H/O anemia, no easy bruising.   Blood pressure 97/72, pulse (!) 123, temperature 97.7 F (36.5 C), temperature source Oral, resp. rate 20, height 6\' 1"  (1.854 m), weight 109.3 kg, SpO2 97 %. Body mass index is 31.79 kg/m. General appearance: alert, cooperative, appears stated age and no distress Head: Normocephalic, atraumatic. Eyes: Brown eyes, pink conjunctiva, corneas clear. PERRL, EOM's intact. Neck: No adenopathy, no carotid bruit, no JVD, supple, symmetrical, trachea midline and thyroid not enlarged. Resp: Clearing to auscultation bilaterally. Cardio: Irregular rate and rhythm, S1, S2 normal, II/VI systolic murmur, no click, rub or gallop GI: Soft, non-tender; bowel sounds normal; no organomegaly. Extremities: No edema, cyanosis or clubbing. Skin: Warm and dry.  Neurologic: Alert and oriented X 3, normal strength. Normal coordination and slow gait.  Assessment/Plan Atrial flutter/fibrilation with RVR Dilated cardiomyopathy Acute on chronic systolic left heart failure, HFrEF ESRD Obesity Type 2 DM Long term anticoagulation  use. Dehydration Dizziness  Agree with gentle hydration. Use digoxin due to CHF and atrial fibrillation with low BP. Add amiodarone for heart rate control. Check echocardiogram. Check TSH and Lipid panel. Awaiting C. Difficile quick screen   Time spent: Review of old records, Lab, x-rays, EKG, other cardiac tests, examination, discussion with patient/Nurse over 70 minutes.  Birdie Riddle, MD  06/05/2020, 10:37 AM

## 2020-06-05 NOTE — ED Notes (Signed)
[facility] at bedside to transport patient. 

## 2020-06-05 NOTE — Consult Note (Addendum)
[facility] Renal Consultation Note    Indication for Consultation:  Management of ESRD/hemodialysis; anemia, hypertension/volume and secondary hyperparathyroidism  IRW:[id number], Bailey Mech, MD  HPI: [name] is a 60 y.o. male with ESRD on HD MWF at [facility].  Past medical history significant for DMT2, HLD, A flutter/fib on coumadin, dilated CM, hypotension on midodrine and Hx nephrectomy 2/2 RCC.  Of note patient is complaint with prescribed dialysis regimen, completed his last treatment yesterday leaving 0.4kg under his dry weight.    Patient sent to the ED following dialysis due to tachycardia and hypotension.  Reports diarrhea for last several weeks after completing antibiotics for URI.  Admits to decreased appetite, vomiting, cough and occasional dizziness.  Reports weight loss due to recent symptoms and SOB prior to dialysis yesterday.  Requested to have his weight challenged 0.5kg due to shortness of breath states they would not agree due to hypotension.  Denies fever, chills, nausea, CP, orthopnea and dizziness.   Pertinent findings since admission include hypotension, tachycardia, COVID negative, C diff negative, CXR with mild vascular congestion, and lactic acid 2.3. Patient has been admitted for further evaluation and management.   Past Medical History:  Diagnosis Date  . Acute lower GI bleeding 06/21/2018  . Acute respiratory failure with hypoxia ([place]) 01/13/2015  . Atrial flutter ([place])   . Bile leak, postoperative 03/15/2016  . Biliary dyskinesia 02/12/2016  . 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 Cardiomyopathy   . CHF (congestive heart failure) ([place])   . Coronary artery disease    normal coronaries by 10/10/08 cath, Cardiac Cath 08-04-12 epic.Dr. Doylene Canard follows  . Diabetes mellitus    pt. states he's borderline diabetic., no longer taking med,- off med. since 2013  . Dialysis patient [facility])    Mon-Wed-Fri([place])- Left AV  fistula  . Diverticulitis November 2016   reoccurred in December 2016  . Diverticulitis of intestine without perforation or abscess without bleeding 04/21/2015  . DVT (deep venous thrombosis) ([place])   . ESRD (end stage renal disease) ([place])   . GERD (gastroesophageal reflux disease)   . Gout   . Hemorrhage of left kidney 01/13/2018  . History of nephrectomy 07/04/2012   History of right nephrectomy in 2002 for renal cell carcinoma   . History of unilateral nephrectomy   . Hypertension   . Low iron   . Myocardial infarction [facility]) ?2006  . Renal cell carcinoma    dialysis- MWF- [address]- Dr. Mercy Moore follows.  . Renal insufficiency   . Shortness of breath 05/19/11   "at rest, lying down, w/exertion"  . Stroke [facility]) 02/2011   05/19/11 denies residual  . Umbilical hernia 86/76/19   unrepaired  . Wears glasses    Past Surgical History:  Procedure Laterality Date  . ANAL FISTULOTOMY  08/15/2018  . AV FISTULA PLACEMENT  03/29/2011   Procedure: ARTERIOVENOUS (AV) FISTULA CREATION;  Surgeon: Hinda Lenis, MD;  Location: [place];  Service: Vascular;  Laterality: Left;  LEFT RADIOCEPHALIC , Arteriovenous [id number])  . CARDIAC CATHETERIZATION  05/21/11  . CARDIAC CATHETERIZATION N/A 03/16/2016   Procedure: Left Heart Cath and Coronary Angiography;  Surgeon: Dixie Dials, MD;  Location: [place] CV LAB;  Service: Cardiovascular;  Laterality: N/A;  . CHOLECYSTECTOMY N/A 02/12/2016   Procedure: LAPAROSCOPIC CHOLECYSTECTOMY WITH INTRAOPERATIVE CHOLANGIOGRAM;  Surgeon: Jackolyn Confer, MD;  Location: [place];  Service: General;  Laterality: N/A;  . COLONOSCOPY WITH PROPOFOL N/A 02/01/2017  Procedure: COLONOSCOPY WITH PROPOFOL;  Surgeon: Juanita Craver, MD;  Location: [facility];  Service: Endoscopy;  Laterality: N/A;  . dialysis cath placed    . ESOPHAGOGASTRODUODENOSCOPY (EGD) WITH PROPOFOL N/A 12/02/2015   Procedure: ESOPHAGOGASTRODUODENOSCOPY (EGD) WITH PROPOFOL;  Surgeon: Juanita Craver, MD;  [facility];  Service: Endoscopy;  Laterality: N/A;  . EVALUATION UNDER ANESTHESIA WITH HEMORRHOIDECTOMY N/A 08/15/2018   Procedure: HEMORRHOID LIGATION/PEXY;  Surgeon: Michael Boston, MD;  Location: [place];  Service: General;  Laterality: N/A;  . FINGER SURGERY     L pinkie finger- ORIF- /w remaining hardware - 1990's    . FISTULOTOMY N/A 08/15/2018   Procedure: SUPERFICIAL ANAL FISTULOTOMY;  Surgeon: Michael Boston, MD;  Location: [place];  Service: General;  Laterality: N/A;  . HEMORRHOID SURGERY    . HERNIA REPAIR N/A 5/80/9983   Umbilical hernia repair  . INSERTION OF DIALYSIS CATHETER N/A 01/27/2016   Procedure: INSERTION OF DIALYSIS CATHETER;  Surgeon: Waynetta Sandy, MD;  Location: [place];  Service: Vascular;  Laterality: N/A;  . INSERTION OF MESH N/A 07/04/2012   Procedure: INSERTION OF MESH;  Surgeon: Madilyn Hook, DO;  Location: [place];  Service: General;  Laterality: N/A;  . IR EMBO ART  VEN HEMORR LYMPH EXTRAV  INC GUIDE ROADMAPPING  01/13/2018  . IR FLUORO GUIDE CV LINE RIGHT  01/13/2018  . IR IVC FILTER PLMT / S&I /IMG GUID/MOD SED  01/27/2018  . IR RENAL SELECTIVE  UNI INC S&I MOD SED  01/13/2018  . IR US GUIDE VASC ACCESS RIGHT  01/13/2018  . KIDNEY CYST REMOVAL  2019  . LAPAROSCOPIC LYSIS OF ADHESIONS N/A 02/12/2016   Procedure: LAPAROSCOPIC LYSIS OF ADHESIONS;  Surgeon: Jackolyn Confer, MD;  Location: [place];  Service: General;  Laterality: N/A;  . LEFT AND RIGHT HEART CATHETERIZATION WITH CORONARY ANGIOGRAM N/A 08/04/2012   Procedure: LEFT AND RIGHT HEART CATHETERIZATION WITH CORONARY ANGIOGRAM;  Surgeon: Birdie Riddle, MD;  Location: [place] CATH LAB;  Service: Cardiovascular;  Laterality: N/A;  . NEPHRECTOMY  2000   right  . REVISON OF ARTERIOVENOUS FISTULA Left 06/05/2013   Procedure: REVISON OF LEFT RADIOCEPHALIC  ARTERIOVENOUS FISTULA;  Surgeon: Conrad Scottdale, MD;  Location: [place];  Service: Vascular;  Laterality: Left;  . REVISON OF ARTERIOVENOUS FISTULA  Left 01/27/2016   Procedure: REVISION OF LEFT UPPER EXTREMITY ARTERIOVENOUS FISTULA;  Surgeon: Waynetta Sandy, MD;  Location: [place];  Service: Vascular;  Laterality: Left;  . REVISON OF ARTERIOVENOUS FISTULA Left 08/03/2016   Procedure: REVISON OF Left arm ARTERIOVENOUS FISTULA;  Surgeon: Elam Dutch, MD;  Location: [place];  Service: Vascular;  Laterality: Left;  . REVISON OF ARTERIOVENOUS FISTULA Left 05/06/2017   Procedure: REVISION PLICATION OF ARTERIOVENOUS FISTULA LEFT ARM;  Surgeon: Rosetta Posner, MD;  Location: [place];  Service: Vascular;  Laterality: Left;  . REVISON OF ARTERIOVENOUS FISTULA Left 02/01/2019   Procedure: REVISION OF ARTERIOVENOUS FISTULA LEFT ARM;  Surgeon: [name], MD;  Location: [place];  Service: Vascular;  Laterality: Left;  . REVISON OF ARTERIOVENOUS FISTULA Left 03/15/2019   Procedure: REVISION PLICATION OF ARTERIOVENOUS FISTULA LEFT ARM;  Surgeon: [name], MD;  Location: [place];  Service: Vascular;  Laterality: Left;  . RIGHT HEART CATHETERIZATION N/A 05/21/2011   Procedure: RIGHT HEART CATH;  Surgeon: Birdie Riddle, MD;  Location: [facility] CATH LAB;  Service: Cardiovascular;  Laterality: N/A;  . smashed  1990's   "left pinky; have a plate in  there"  . UMBILICAL HERNIA REPAIR N/A 07/04/2012   Procedure: LAPAROSCOPIC UMBILICAL HERNIA;  Surgeon: Madilyn Hook, DO;  Location: [place];  Service: General;  Laterality: N/A;  . US ECHOCARDIOGRAPHY  05/20/11   Family History  Problem Relation Age of Onset  . Hypertension Mother   . Kidney disease Mother    Social History:  reports that he has never smoked. He has never used smokeless tobacco. He reports that he does not drink alcohol and does not use drugs. Allergies  Allergen Reactions  . Ace Inhibitors Itching, Cough and Other (See Comments)   Prior to Admission medications   Medication Sig Start Date End Date Taking? Authorizing Provider  albuterol (VENTOLIN HFA) 108 (90 Base) MCG/ACT  inhaler Inhale 2 puffs into the lungs every 6 (six) hours as needed for wheezing or shortness of breath. 09/18/19  Yes [name], FNP  calcium acetate (PHOSLO) 667 MG capsule Take 1-3 capsules (667-2,001 mg total) by mouth See admin instructions. Take 2001 mg capsules with meals three times daily and 667 mg capsule with snacks. Patient taking differently: Take 2,001 mg by mouth daily. 11/23/18  Yes Dixie Dials, MD  chlorhexidine (PERIDEX) 0.12 % solution 15 mLs by Mouth Rinse route 2 (two) times daily. 05/29/18  Yes [provider]  cinacalcet (SENSIPAR) 90 MG tablet Take 180 mg by mouth daily.   Yes [provider]  diclofenac Sodium (VOLTAREN) 1 % GEL Apply 2 g topically 4 (four) times daily. 11/15/19  Yes [name], FNP  ferric citrate (AURYXIA) 1 GM 210 MG(Fe) tablet Take 630 mg by mouth 3 (three) times daily with meals.   Yes [provider]  fluticasone furoate-vilanterol (BREO ELLIPTA) 100-25 MCG/INH AEPB Inhale 1 puff into the lungs 2 (two) times daily. Patient taking differently: Inhale 1 puff into the lungs 2 (two) times daily as needed (wheezing). 06/03/20  Yes [name], FNP  gabapentin (NEURONTIN) 100 MG capsule Take 1 capsule (100 mg total) by mouth 3 (three) times daily as needed (coughing uncontrollably). 06/03/20  Yes [name], FNP  HYDROcodone-homatropine (HYDROMET) 5-1.5 MG/5ML syrup Take 5 mLs by mouth every 6 (six) hours as needed. Patient taking differently: Take 5 mLs by mouth every 6 (six) hours as needed for cough. 06/03/20  Yes [name], FNP  ipratropium (ATROVENT) 0.06 % nasal spray Place 2 sprays into both nostrils 4 (four) times daily. Patient taking differently: Place 2 sprays into both nostrils 4 (four) times daily as needed for rhinitis. 11/15/19  Yes [name], FNP  lidocaine-prilocaine (EMLA) cream Apply 1 application topically See admin instructions. Apply topically to port access prior to dialysis - Monday, Wednesday,  Friday. 04/08/15  Yes [provider]  midodrine (PROAMATINE) 10 MG tablet Take 10 mg by mouth 3 (three) times daily.   Yes [provider]  multivitamin (RENA-VIT) TABS tablet Take 1 tablet by mouth at bedtime.   Yes [provider]  nitroGLYCERIN (NITROSTAT) 0.4 MG SL tablet Place 0.4 mg under the tongue every 5 (five) minutes as needed for chest pain.  05/20/15  Yes [provider]  Nutritional Supplements (FEEDING SUPPLEMENT, NEPRO CARB STEADY,) LIQD Take 237 mLs by mouth daily. 03/22/16  Yes [provider]  ondansetron (ZOFRAN ODT) 4 MG disintegrating tablet Take 1 tablet (4 mg total) by mouth every 8 (eight) hours as needed for nausea or vomiting. 09/12/19  Yes Carmin Muskrat, MD  oxyCODONE-acetaminophen (PERCOCET/ROXICET) 5-325 MG tablet Take 1 tablet by mouth every 4 (four) hours as  needed for severe pain.   Yes [provider]  pantoprazole (PROTONIX) 40 MG tablet Take 40 mg by mouth 2 (two) times daily.   Yes [provider]  sucroferric oxyhydroxide (VELPHORO) 500 MG chewable tablet Chew 500-1,000 mg by mouth See admin instructions. Takes 2 tablets with each meal and 1 tablet with snacks. 10/02/18  Yes [provider]  warfarin (COUMADIN) 7.5 MG tablet Take 7.5 mg by mouth daily.   Yes [provider]  allopurinol (ZYLOPRIM) 100 MG tablet Take 1 tablet (100 mg total) by mouth daily. Patient not taking: No sig reported 11/24/18   Dixie Dials, MD  UNABLE TO FIND Out patient physical therapy- vestibular therapy for BPPV 08/01/18   Debbe Odea, MD   Current Facility-Administered Medications  Medication Dose Route Frequency Provider Last Rate Last Admin  . acetaminophen (TYLENOL) tablet 650 mg  650 mg Oral Q6H PRN Opyd, Ilene Qua, MD       Or  . acetaminophen (TYLENOL) suppository 650 mg  650 mg Rectal Q6H PRN Opyd, Ilene Qua, MD      . albuterol (VENTOLIN HFA) 108 (90 Base) MCG/ACT inhaler 2 puff  2 puff  Inhalation Q6H PRN Opyd, Ilene Qua, MD      . amiodarone (PACERONE) tablet 200 mg  200 mg Oral BID Dixie Dials, MD   200 mg at 06/05/20 1135  . benzonatate (TESSALON) capsule 200 mg  200 mg Oral TID Terrilee Croak, MD   200 mg at 06/05/20 1135  . calcium acetate (PHOSLO) capsule 2,001 mg  2,001 mg Oral TID WC Opyd, Ilene Qua, MD      . calcium acetate (PHOSLO) capsule 667 mg  667 mg Oral PRN Opyd, Ilene Qua, MD      . Derrill Memo ON 06/06/2020] ceFEPIme (MAXIPIME) 2 g in sodium chloride 0.9 % 100 mL IVPB  2 g Intravenous Q M,W,F-HD Eudelia Bunch, RPH      . cinacalcet (SENSIPAR) tablet 180 mg  180 mg Oral Daily Opyd, Ilene Qua, MD   180 mg at 06/05/20 1134  . diclofenac Sodium (VOLTAREN) 1 % topical gel 2 g  2 g Topical QID Opyd, Ilene Qua, MD   2 g at 06/05/20 0813  . digoxin (LANOXIN) 0.25 MG/ML injection 0.25 mg  0.25 mg Intravenous Daily Dixie Dials, MD   0.25 mg at 06/05/20 1126  . feeding supplement (NEPRO CARB STEADY) liquid 237 mL  237 mL Oral Daily Opyd, Ilene Qua, MD   237 mL at 06/05/20 0813  . fluticasone furoate-vilanterol (BREO ELLIPTA) 100-25 MCG/INH 1 puff  1 puff Inhalation BID Opyd, Ilene Qua, MD   1 puff at 06/05/20 0817  . gabapentin (NEURONTIN) capsule 100 mg  100 mg Oral TID PRN Opyd, Ilene Qua, MD      . guaiFENesin (MUCINEX) 12 hr tablet 600 mg  600 mg Oral BID Terrilee Croak, MD   600 mg at 06/05/20 0948  . loperamide (IMODIUM) capsule 2 mg  2 mg Oral Q6H Dahal, Binaya, MD   2 mg at 06/05/20 1135  . metroNIDAZOLE (FLAGYL) IVPB 500 mg  500 mg Intravenous Q8H Opyd, Ilene Qua, MD 100 mL/hr at 06/05/20 1139 500 mg at 06/05/20 1139  . midodrine (PROAMATINE) tablet 10 mg  10 mg Oral TID WC Opyd, Ilene Qua, MD   10 mg at 06/05/20 1135  . ondansetron (ZOFRAN) tablet 4 mg  4 mg Oral Q6H PRN Opyd, Ilene Qua, MD       Or  .  ondansetron (ZOFRAN) injection 4 mg  4 mg Intravenous Q6H PRN Opyd, Ilene Qua, MD      . pantoprazole (PROTONIX) EC tablet 40 mg  40 mg Oral BID Opyd, Ilene Qua,  MD   40 mg at 06/05/20 0806  . sucroferric oxyhydroxide (VELPHORO) chewable tablet 1,000 mg  1,000 mg Oral TID WC Opyd, Ilene Qua, MD   1,000 mg at 06/05/20 1134  . sucroferric oxyhydroxide (VELPHORO) chewable tablet 500 mg  500 mg Oral PRN Opyd, Ilene Qua, MD      . Derrill Memo ON 06/06/2020] vancomycin (VANCOCIN) IVPB 1000 mg/200 mL premix  1,000 mg Intravenous Q M,W,F-HD Leodis Sias T, RPH      . warfarin (COUMADIN) tablet 3 mg  3 mg Oral ONCE-1600 Don[place]e Jude, [facility]      . Warfarin - Pharmacist Dosing Inpatient   Does not apply q1600 Angela Adam [facility]       Labs: Basic Metabolic Panel: Recent Labs  Lab 06/04/20 1741 06/05/20 0613  NA 135 134*  K 4.5 4.2  CL 91* 94*  CO2 28 26  GLUCOSE 88 90  BUN 27* 29*  CREATININE 6.38* 7.28*  CALCIUM 9.5 9.3   Liver Function Tests: Recent Labs  Lab 06/05/20 0114  AST 10*  ALT 9  ALKPHOS 104  BILITOT 0.8  PROT 7.9  ALBUMIN 3.3*   CBC: Recent Labs  Lab 06/04/20 1741 06/05/20 0613  WBC 7.0 6.4  HGB 12.2* 10.3*  HCT 39.0 33.1*  MCV 96.5 96.2  PLT 223 173   Studies/Results: DG Chest Port 1 View  Result Date: 06/04/2020 CLINICAL DATA:  60 year old male with cough. EXAM: PORTABLE CHEST 1 VIEW COMPARISON:  Chest radiograph dated 04/24/2020. FINDINGS: Minimal bibasilar atelectasis. No focal consolidation, pleural effusion or pneumothorax. There is mild cardiomegaly with mild vascular congestion. Atherosclerotic calcification of the aorta. No acute osseous pathology. IMPRESSION: Cardiomegaly with mild vascular congestion. No focal consolidation. Electronically Signed   By: Anner Crete M.D.   On: 06/04/2020 18:44    ROS: All others negative except those listed in HPI.   Physical Exam: Vitals:   06/05/20 0817 06/05/20 0900 06/05/20 1000 06/05/20 1140  BP:  (!) 88/76 97/72 96/66   Pulse:  (!) 124 (!) 123 (!) 122  Resp:  19 20 19   Temp:    98 F (36.7 C)  TempSrc:    Oral  SpO2: 96% 98% 97% 96%  Weight:      Height:          General: well appearing male in NAD Head: NCAT sclera not icteric MMM Neck: Supple. No lymphadenopathy Lungs: CTA bilaterally. No wheeze, rales or rhonchi. Breathing is unlabored. Heart: +tachycardia, irregular rhythm. No murmur, rubs or gallops.  Abdomen: soft, nontender, +BS, no guarding, no rebound tenderness Lower extremities:no edema, ischemic changes, or open wounds  Neuro: AAOx3. Moves all extremities spontaneously. Psych:  Responds to questions appropriately with a normal affect. Dialysis Access: LU AVF +b/t  Dialysis Orders:  MWF - [facility]  4hrs, BFR 450, DFR 800,  EDW 107kg, 2K/ 2Ca Profile 2  Access: LU AVF Heparin 4000 bolus venofer 50mg  qwk Mircera  51mcg q2wks - last 06/02/20 Hectorol 71mcg IV qHD    Assessment/Plan: 1.  Hypotension - on midodrine chronically. ECHO pending, TSH normal. Given 2L IV fluids.  wkup ongoing. 2. A fib - using digoxin/amiodarone per cardio 3. Diarrhea - x2wks. C diff negative. ?viral gastroenteritis  4.  ESRD -  On HD MWF.  Orders written for HD tomorrow per regular schedule. K 4.2. 5. Volume  - patient feels like he has lost weight and has been SOB but volume removal complicated by hypotension.  Does not appear grossly volume overloaded. CXR showed mild vascular congestion.  Continue midodrine 10mg  TID w/extra dose pre HD. UF as tolerated.  Get standing weights.  6.  Anemia of CKD - Hgb 10.3 - ESA recently dosed.  7.  Secondary Hyperparathyroidism -  Ca in goal. Will check phos. Continue VDRA, binders 8.  Nutrition - Renal diet w/fluid restrictions. 9. DMT2 - per primary  Jen Mow, PA-C [facility] 06/05/2020, 3:40 PM   Pt seen, examined and agree w A/P as above.  Kelly Splinter  MD 06/05/2020, 4:42 PM

## 2020-06-05 NOTE — Progress Notes (Signed)
   06/05/20 0815  Assess: MEWS Score  BP (!) 84/60  Pulse Rate (!) 124  ECG Heart Rate (!) 124  SpO2 97 %  O2 Device Room Air  Assess: MEWS Score  MEWS Temp 0  MEWS Systolic 1  MEWS Pulse 2  MEWS RR 1  MEWS LOC 0  MEWS Score 4  MEWS Score Color Red  Assess: if the MEWS score is Yellow or Red  Were vital signs taken at a resting state? Yes  Focused Assessment No change from prior assessment  Early Detection of Sepsis Score *See Row Information* High  MEWS guidelines implemented *See Row Information* No, previously red, continue vital signs every 4 hours  Treat  MEWS Interventions Administered scheduled meds/treatments  Pain Scale 0-10  Pain Score 0  Take Vital Signs  Increase Vital Sign Frequency  Red: Q 1hr X 4 then Q 4hr X 4, if remains red, continue Q 4hrs  Escalate  MEWS: Escalate Red: discuss with charge nurse/RN and provider, consider discussing with RRT  Notify: Charge Nurse/RN  Name of Charge Nurse/RN Notified Ronalee Belts RN  Date Charge Nurse/RN Notified 06/05/20  Time Charge Nurse/RN Notified 0830  Notify: Provider  Provider Name/Title DR Dahal  Date Provider Notified 06/05/20  Time Provider Notified 0800  Notification Type Call  Notification Reason Change in status  Response See new orders  Date of Provider Response 06/05/20  Time of Provider Response 0800  Notify: Rapid Response  Name of Rapid Response RN Notified N/A  Document  Patient Outcome Not stable and remains on department  Progress note created (see row info) Yes

## 2020-06-05 NOTE — Progress Notes (Signed)
ANTICOAGULATION CONSULT NOTE - Initial Consult  Pharmacy Consult for warfarin Indication: atrial fibrillation  Allergies  Allergen Reactions  . Ace Inhibitors Itching, Cough and Other (See Comments)    Patient Measurements: Height: 6\' 1"  (185.4 cm) Weight: 109.3 kg (240 lb 15.4 oz) IBW/kg (Calculated) : 79.9  Vital Signs: Temp: 97.7 F (36.5 C) (01/13 0600) Temp Source: Oral (01/13 0600) BP: 84/60 (01/13 0815) Pulse Rate: 124 (01/13 0815)  Labs: Recent Labs    06/04/20 1730 06/04/20 1741 06/05/20 0114 06/05/20 0613  HGB  --  12.2*  --  10.3*  HCT  --  39.0  --  33.1*  PLT  --  223  --  173  APTT  --   --  53*  --   LABPROT 20.3*  --   --   --   INR 1.8*  --   --   --   CREATININE  --  6.38*  --  7.28*    Estimated Creatinine Clearance: 14.5 mL/min (A) (by C-G formula based on SCr of 7.28 mg/dL (H)).   Medical History: Past Medical History:  Diagnosis Date  . Acute lower GI bleeding 06/21/2018  . Acute respiratory failure with hypoxia ([place]) 01/13/2015  . Atrial flutter ([place])   . Bile leak, postoperative 03/15/2016  . Biliary dyskinesia 02/12/2016  . 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 Cardiomyopathy   . CHF (congestive heart failure) ([place])   . Coronary artery disease    normal coronaries by 10/10/08 cath, Cardiac Cath 08-04-12 epic.Dr. Doylene Canard follows  . Diabetes mellitus    pt. states he's borderline diabetic., no longer taking med,- off med. since 2013  . Dialysis patient [facility])    Mon-Wed-Fri([place])- Left AV fistula  . Diverticulitis November 2016   reoccurred in December 2016  . Diverticulitis of intestine without perforation or abscess without bleeding 04/21/2015  . DVT (deep venous thrombosis) ([place])   . ESRD (end stage renal disease) ([place])   . GERD (gastroesophageal reflux disease)   . Gout   . Hemorrhage of left kidney 01/13/2018  . History of nephrectomy 07/04/2012   History of right nephrectomy in 2002 for  renal cell carcinoma   . History of unilateral nephrectomy   . Hypertension   . Low iron   . Myocardial infarction [facility]) ?2006  . Renal cell carcinoma    dialysis- MWF- [address]- Dr. Mercy Moore follows.  . Renal insufficiency   . Shortness of breath 05/19/11   "at rest, lying down, w/exertion"  . Stroke [facility]) 02/2011   05/19/11 denies residual  . Umbilical hernia 11/57/26   unrepaired  . Wears glasses     Medications: Warfarin 7.5 mg PO daily PTA Last dose: 06/03/20 PTA  Assessment: Pt is a 49 yoM presenting with hypotension and sepsis.  Pharmacy consulted for warfarin dosing, which patient was on PTA for atrial fibrillation. Most of the INRs in EHR are subtherapeutic.   Not significant drug interaction with metronidazole. INR 1.8 on admit. Received 7.5mg  warfarin today at 4am so will give much decreased dose.  Warfarin PTA dose 7.5mg  daily    Goal of Therapy:  INR 2-3 Monitor platelets by anticoagulation protocol: Yes   Plan:  Additional Warfarin 3mg  x1 today Monitor daily INR, CBC/plt Monitor for signs/symptoms of bleeding  F/u metronidazole duration and interaction   Benetta Spar, PharmD, BCPS, BCCP Clinical Pharmacist  Please check AMION for all [place] phone numbers After  10:00 PM, call [place] [phone]

## 2020-06-05 NOTE — Progress Notes (Signed)
PROGRESS NOTE  [name]  DOB: 11-28-1960  PCP: Glendale Chard, MD FGH:[id number]  DOA: 06/04/2020  LOS: 1 day   Chief complaint: Fatigue  Brief narrative: [name] is a 60 y.o. male with PMH significant for ESRD-HD-MWF, DM 2, HTN, HLD, chronic systolic CHF, atrial fibrillation on warfarin, and iron deficiency anemia, chronic pain.  On 1/12, patient was at dialysis where he completed his treatment but was then noted to be tachycardic and hypotensive and sent to the emergency department for evaluation of this.   Patient reports a nonproductive cough for several months now despite treatment with antibiotics and steroids, reports testing negative for COVID recently, has received 3 doses of COVID-vaccine.  Also reportsnausea with nonbloody vomiting as well as diarrhea for roughly 2 weeks now.  There is some mild generalized abdominal discomfort associated with this that improves for a while after having a loose stool.   In the ED, patient was afebrile, tachycardic to 678L, and with systolic blood pressure was low ranging from 66-82.  EKG showed atrial flutter with rate 124.   Chest x-ray showed cardiomegaly with vascular congestion.   Chemistry panel showed a potassium of 4.5, bicarbonate 28, and BUN 27.   CBC showed normal WBC and platelets, and hemoglobin 12.2.  Lactic acid elevated to 2.8.   Patient was started on sepsis pathway.  Blood culture collected.  IV fluid given.  Broad-spectrum antibiotic coverage given with IV vancomycin, cefepime, and Flagyl.  Admitted to hospitalist service  Subjective: Patient was seen and examined this morning.  Patient clinically does not look sick as he looks in the chart.  On chart review, I noted that patient has remained tachycardic to more than 120 consistently overnight, blood pressure seems to be running in 70s and 80s overnight. Labs this morning with lactic acid remaining elevated at 2.3. He is alert, awake, oriented x3.  Propped up.  Not  feeling dizzy or altered.  Taking breakfast. Stephenson rate runs more than 120 in the monitor.  Assessment/Plan: Severe sepsis with hypotension -Presented with diarrhea, tachycardia, hypotension, lactic acidosis.   -He reports ongoing diarrhea and vomiting for last 2 to 3 weeks.  Probably viral gastroenteritis causing severe dehydration leading to hypotension, lactic acidosis. -But he is also immunocompromised due to ESRD status.  I would agree treating him as severe sepsis at this time. -Is currently on IV cefepime and IV vancomycin. -I do not think patient has gotten enough IV fluid so far.  I will give him a liter of bolus this morning.  If continues to remain hypotensive intake adequate, need further escalation of care for potential septic shock.  -May need critical care consultation if blood pressure does not improve. Recent Labs  Lab 06/04/20 1741 06/05/20 0114 06/05/20 0613  WBC 7.0  --  6.4  LATICACIDVEN 2.8* 2.3*  --   PROCALCITON  --  0.88  --    Nausea, vomiting, diarrhea  -Patient reports 2 weeks of loose stools with mild abd discomfort and some N/V  -He has been on antibiotics recently for URI  -C. difficile and GI pathogen panel ordered.   -Start Imodium scheduled for next 2 days and Zofran as needed.  Chronic cough -Ongoing dry cough for last 2 to 3 weeks -Chest x-ray on admission showed mild vascular congestion. -We will start the patient on Tessalon Perles scheduled. -Not in respiratory distress except for when he has severe bouts of cough.  Chronic systolic CHF  Chronic hypotension -EF was 35-40%  in 2020  -CHF appears compensated.  Last echo from July 2020 with EF 35 to 40%.  Obtain repeat echo. -Patient states that his blood pressure lately has been running low because of which his CHF meds were held.  He is also on midodrine.  I would continue midodrine 10 mg 3 times daily at this time.  Continue to monitor blood pressure.  ESRD-HD-MWF -Completed HD 06/04/20.   -Continue binders, judicious with fluid, renally-dose medications   -We will involve nephrology in house. No need of urgent dialysis today.  Atrial fibrillation with RVR  -Rate 120s on admission, secondary to sepsis/dehydration. -May benefit from amiodarone/digoxin.  Patient's cardiologist Dr. Doylene Canard informed. -check INR and continue warfarin with pharmacy assistance   Chronic pain  -No pain complaints on admission  -Hold opiates until BP improves    Mobility: Encourage ambulation Code Status:   Code Status: Full Code  Nutritional status: Body mass index is 31.79 kg/m.     Diet Order            Diet renal with fluid restriction Fluid restriction: 1200 mL Fluid; Room service appropriate? Yes; Fluid consistency: Thin  Diet effective now                 DVT prophylaxis: Place and maintain sequential compression device Start: 06/05/20 1005 warfarin (COUMADIN) tablet 3 mg    Antimicrobials:  IV cefepime/vancomycin Fluid: Bolus of LR 1 L this morning Consultants: Cardiologist, nephrology Family Communication:  Not at bedside  Status is: Inpatient  Remains inpatient appropriate because:Hemodynamically unstable   Dispo: The patient is from: Home              Anticipated d/c is to: Home              Anticipated d/c date is: > 3 days              Patient currently is not medically stable to d/c.       Infusions:  . [START ON 06/06/2020] ceFEPime (MAXIPIME) IV    . metronidazole 500 mg (06/05/20 0415)  . [START ON 06/06/2020] vancomycin      Scheduled Meds: . benzonatate  200 mg Oral TID  . calcium acetate  2,001 mg Oral TID WC  . cinacalcet  180 mg Oral Daily  . diclofenac Sodium  2 g Topical QID  . feeding supplement (NEPRO CARB STEADY)  237 mL Oral Daily  . fluticasone furoate-vilanterol  1 puff Inhalation BID  . guaiFENesin  600 mg Oral BID  . loperamide  2 mg Oral Q6H  . midodrine  10 mg Oral TID WC  . pantoprazole  40 mg Oral BID  . sucroferric  oxyhydroxide  1,000 mg Oral TID WC  . warfarin  3 mg Oral ONCE-1600  . Warfarin - Pharmacist Dosing Inpatient   Does not apply q1600    Antimicrobials: Anti-infectives (From admission, onward)   Start     Dose/Rate Route Frequency Ordered Stop   06/06/20 1200  vancomycin (VANCOCIN) IVPB 1000 mg/200 mL premix        1,000 mg 200 mL/hr over 60 Minutes Intravenous Every M-W-F (Hemodialysis) 06/04/20 2132     06/06/20 1200  ceFEPIme (MAXIPIME) 2 g in sodium chloride 0.9 % 100 mL IVPB        2 g 200 mL/hr over 30 Minutes Intravenous Every M-W-F (Hemodialysis) 06/04/20 2132     06/05/20 0400  metroNIDAZOLE (FLAGYL) IVPB 500 mg  500 mg 100 mL/hr over 60 Minutes Intravenous Every 8 hours 06/04/20 2112     06/04/20 2230  vancomycin (VANCOCIN) 2,500 mg in sodium chloride 0.9 % 500 mL IVPB        2,500 mg 250 mL/hr over 120 Minutes Intravenous  Once 06/04/20 2125 06/05/20 0059   06/04/20 1945  vancomycin (VANCOREADY) IVPB 2000 mg/400 mL  Status:  Discontinued        2,000 mg 200 mL/hr over 120 Minutes Intravenous  Once 06/04/20 1914 06/04/20 2125   06/04/20 1915  ceFEPIme (MAXIPIME) 2 g in sodium chloride 0.9 % 100 mL IVPB        2 g 200 mL/hr over 30 Minutes Intravenous  Once 06/04/20 1910 06/04/20 2033   06/04/20 1915  metroNIDAZOLE (FLAGYL) IVPB 500 mg        500 mg 100 mL/hr over 60 Minutes Intravenous  Once 06/04/20 1910 06/04/20 2138   06/04/20 1915  vancomycin (VANCOCIN) IVPB 1000 mg/200 mL premix  Status:  Discontinued        1,000 mg 200 mL/hr over 60 Minutes Intravenous  Once 06/04/20 1910 06/04/20 1914      PRN meds: acetaminophen **OR** acetaminophen, albuterol, calcium acetate, gabapentin, ondansetron **OR** ondansetron (ZOFRAN) IV, sucroferric oxyhydroxide   Objective: Vitals:   06/05/20 0900 06/05/20 1000  BP: (!) 88/76 97/72  Pulse: (!) 124 (!) 123  Resp: 19 20  Temp:    SpO2: 98% 97%    Intake/Output Summary (Last 24 hours) at 06/05/2020 1004 Last data  filed at 06/05/2020 0600 Gross per 24 hour  Intake 1545.88 ml  Output --  Net 1545.88 ml   Filed Weights   06/05/20 0300  Weight: 109.3 kg   Weight change:  Body mass index is 31.79 kg/m.   Physical Exam: General exam: Pleasant, middle-aged African-American male.  Not in distress Skin: No rashes, lesions or ulcers. HEENT: Atraumatic, normocephalic, no obvious bleeding Lungs: Clear to auscultation bilaterally but coughs violently on deep breathing CVS: Irregular, tachycardic, no murmur GI/Abd soft, nontender, nondistended, bowel sound present CNS: Alert, awake, noted x3 Psychiatry: Mood appropriate Extremities: No pedal edema, no calf tenderness  Data Review: I have personally reviewed the laboratory data and studies available.  Recent Labs  Lab 06/04/20 1741 06/05/20 0613  WBC 7.0 6.4  HGB 12.2* 10.3*  HCT 39.0 33.1*  MCV 96.5 96.2  PLT 223 173   Recent Labs  Lab 06/04/20 1739 06/04/20 1741 06/05/20 0613  NA  --  135 134*  K  --  4.5 4.2  CL  --  91* 94*  CO2  --  28 26  GLUCOSE  --  88 90  BUN  --  27* 29*  CREATININE  --  6.38* 7.28*  CALCIUM  --  9.5 9.3  MG 2.3  --   --     F/u labs ordered  Signed, Terrilee Croak, MD [facility] 06/05/2020

## 2020-06-05 NOTE — Plan of Care (Signed)
  Problem: Clinical Measurements: Goal: Will remain free from infection Outcome: Progressing   Problem: Coping: Goal: Level of anxiety will decrease Outcome: Progressing   Problem: Safety: Goal: Ability to remain free from injury will improve Outcome: Progressing   

## 2020-06-05 NOTE — Plan of Care (Signed)
  Problem: Safety: Goal: Ability to remain free from injury will improve Outcome: Progressing   Problem: Activity: Goal: Capacity to carry out activities will improve Outcome: Progressing   

## 2020-06-05 NOTE — Progress Notes (Signed)
Initial Nutrition Assessment  DOCUMENTATION CODES:   Non-severe (moderate) malnutrition in context of chronic illness  INTERVENTION:   Nepro Shake po TID, each supplement provides 425 kcal and 19 grams protein  Renal MVI daily  NUTRITION DIAGNOSIS:   Moderate Malnutrition related to chronic illness (ESRD, CHF) as evidenced by moderate muscle depletion,mild muscle depletion,energy intake < 75% for > or equal to 1 month.  GOAL:   Patient will meet greater than or equal to 90% of their needs  MONITOR:   PO intake,Supplement acceptance,Labs,Weight trends,I & O's  REASON FOR ASSESSMENT:   Malnutrition Screening Tool    ASSESSMENT:   60 y.o. male with PMH of ESRD on HD (MWF), T2DM, HTN, HLD, CHF, Afib on warfarin, iron deficiency anemia, and chronic pain. Patient presented with diarrhea x 2 weeks, tachycardia, hypotension, and lactic acidosis. Admitted for severe sepsis with hypotension.  Spoke with pt at bedside. He reports his appetite has been poor for about 2-3 weeks. He reports when he tries to eat anything he will vomit shortly after consumption. Pt reports that prior the episodes of vomiting and diarrhea, his appetite was okay. He reports on non-HD days, he would consume 2 meals a day, usually breakfast and dinner. He reports that on days he went to HD, he would often eat nothing, except for one Nepro shake. He denies drinking Nepro at home. Per chart review, pt consuming 15-25% of documented meal records. Breakfast and lunch tray were noted at bedside with only bites and sips consumed.   Pt reports he has experienced a 15 lb weight loss within the past 3 weeks. Per chart review, pt weight has remained stable over the past year until a decrease from September to December. Weight on 02/06/20 was 114.1 kg. Weight on 04/25/20 was 108 kg. This decrease indicates a 5% loss in 3 months, which is insignificant for time frame. Pt reports his UBW is around 250 lbs but states it has been a  few months since he was last at this weight. Per nephrology, his EDW is 107 kg. Pt current weight this admission is 109.3 kg (241 lbs).   Net positive 2 L since admission. Last HD was on 1/12.   Labs reviewed: Chloride 94  Medications reviewed and include: Sensipar, Imodium, Protonix, warfarin, Velphoro, Phoslo, IV Flagyl   NUTRITION - FOCUSED PHYSICAL EXAM:  Flowsheet Row Most Recent Value  Orbital Region No depletion  Upper Arm Region No depletion  Thoracic and Lumbar Region No depletion  Buccal Region Mild depletion  Temple Region Moderate depletion  Clavicle Bone Region Mild depletion  Clavicle and Acromion Bone Region Mild depletion  Scapular Bone Region Unable to assess  Dorsal Hand Mild depletion  Patellar Region Moderate depletion  Anterior Thigh Region Mild depletion  Posterior Calf Region Mild depletion  Edema (RD Assessment) None  Hair Reviewed  Eyes Reviewed  Mouth Reviewed  Skin Reviewed  Nails Reviewed       Diet Order:   Diet Order            Diet renal with fluid restriction Fluid restriction: 1200 mL Fluid; Room service appropriate? Yes; Fluid consistency: Thin  Diet effective now                 EDUCATION NEEDS:   No education needs have been identified at this time  Skin:  Skin Assessment: Reviewed RN Assessment  Last BM:  06/05/20  Height:   Ht Readings from Last 1 Encounters:  06/05/20 6\' 1"  (  1.854 m)    Weight:   Wt Readings from Last 1 Encounters:  06/05/20 109.3 kg     BMI:  Body mass index is 31.79 kg/m.  Estimated Nutritional Needs:   Kcal:  2446-2863 kcals  Protein:  115-125 grams  Fluid:  1000 mL + UOP    Ronnald Nian, Dietetic Intern Pager: [phone] If unavailable: [phone]

## 2020-06-06 LAB — RENAL FUNCTION PANEL
Albumin: 3.1 g/dL — ABNORMAL LOW (ref 3.5–5.0)
Anion gap: 16 — ABNORMAL HIGH (ref 5–15)
BUN: 45 mg/dL — ABNORMAL HIGH (ref 6–20)
CO2: 23 mmol/L (ref 22–32)
Calcium: 9.1 mg/dL (ref 8.9–10.3)
Chloride: 95 mmol/L — ABNORMAL LOW (ref 98–111)
Creatinine, Ser: 10.32 mg/dL — ABNORMAL HIGH (ref 0.61–1.24)
GFR, Estimated: 5 mL/min — ABNORMAL LOW (ref >60)
Glucose, Bld: 107 mg/dL — ABNORMAL HIGH (ref 70–99)
Phosphorus: 6.6 mg/dL — ABNORMAL HIGH (ref 2.5–4.6)
Potassium: 4 mmol/L (ref 3.5–5.1)
Sodium: 134 mmol/L — ABNORMAL LOW (ref 135–145)

## 2020-06-06 LAB — CBC
HCT: 32.7 % — ABNORMAL LOW (ref 39.0–52.0)
HCT: 28.3 % — ABNORMAL LOW (ref 39.0–52.0)
Hemoglobin: 10.2 g/dL — ABNORMAL LOW (ref 13.0–17.0)
Hemoglobin: 9.3 g/dL — ABNORMAL LOW (ref 13.0–17.0)
MCH: 29.7 pg (ref 26.0–34.0)
MCH: 31 pg (ref 26.0–34.0)
MCHC: 31.2 g/dL (ref 30.0–36.0)
MCHC: 32.9 g/dL (ref 30.0–36.0)
MCV: 95.1 fL (ref 80.0–100.0)
MCV: 94.3 fL (ref 80.0–100.0)
Platelets: 184 10*3/uL (ref 150–400)
Platelets: 181 10*3/uL (ref 150–400)
RBC: 3.44 MIL/uL — ABNORMAL LOW (ref 4.22–5.81)
RBC: 3 MIL/uL — ABNORMAL LOW (ref 4.22–5.81)
RDW: 18.7 % — ABNORMAL HIGH (ref 11.5–15.5)
RDW: 18.9 % — ABNORMAL HIGH (ref 11.5–15.5)
WBC: 5.8 10*3/uL (ref 4.0–10.5)
WBC: 6.2 10*3/uL (ref 4.0–10.5)
nRBC: 0 % (ref 0.0–0.2)
nRBC: 0.3 % — ABNORMAL HIGH (ref 0.0–0.2)

## 2020-06-06 LAB — BASIC METABOLIC PANEL
Anion gap: 16 — ABNORMAL HIGH (ref 5–15)
BUN: 38 mg/dL — ABNORMAL HIGH (ref 6–20)
CO2: 23 mmol/L (ref 22–32)
Calcium: 9 mg/dL (ref 8.9–10.3)
Chloride: 94 mmol/L — ABNORMAL LOW (ref 98–111)
Creatinine, Ser: 9.61 mg/dL — ABNORMAL HIGH (ref 0.61–1.24)
GFR, Estimated: 6 mL/min — ABNORMAL LOW (ref >60)
Glucose, Bld: 92 mg/dL (ref 70–99)
Potassium: 3.9 mmol/L (ref 3.5–5.1)
Sodium: 133 mmol/L — ABNORMAL LOW (ref 135–145)

## 2020-06-06 LAB — ECHOCARDIOGRAM COMPLETE
Area-P 1/2: 5.97 cm2
Height: 73 in
P 1/2 time: 318 msec
S' Lateral: 6.1 cm
Single Plane A4C EF: 29.3 %
Weight: 3855.4 oz

## 2020-06-06 LAB — LACTIC ACID, PLASMA: Lactic Acid, Venous: 2.3 mmol/L (ref 0.5–1.9)

## 2020-06-06 LAB — PROTIME-INR
INR: 2.6 — ABNORMAL HIGH (ref 0.8–1.2)
Prothrombin Time: 26.9 seconds — ABNORMAL HIGH (ref 11.4–15.2)

## 2020-06-06 MED ORDER — METOPROLOL SUCCINATE ER 25 MG PO TB24
25.0000 mg | ORAL_TABLET | Freq: Every day | ORAL | Status: DC
  Administered 2020-06-06 – 2020-06-09 (×2): 25 mg via ORAL
  Filled 2020-06-06 (×4): qty 1

## 2020-06-06 MED ORDER — LIDOCAINE HCL (PF) 1 % IJ SOLN: 5.0000 mL | INTRAMUSCULAR | Status: DC | PRN

## 2020-06-06 MED ORDER — LACTATED RINGERS IV SOLN: INTRAVENOUS | Status: DC

## 2020-06-06 MED ORDER — SODIUM CHLORIDE 0.9 % IV SOLN: 100.0000 mL | INTRAVENOUS | Status: DC | PRN

## 2020-06-06 MED ORDER — MIDODRINE HCL 5 MG PO TABS
10.0000 mg | ORAL_TABLET | ORAL | Status: DC | PRN
Start: 2020-06-06 — End: 2020-06-09
  Administered 2020-06-06: 10 mg via ORAL
  Filled 2020-06-06: qty 2

## 2020-06-06 MED ORDER — ALTEPLASE 2 MG IJ SOLR: 2.0000 mg | Freq: Once | INTRAMUSCULAR | Status: DC | PRN

## 2020-06-06 MED ORDER — WARFARIN SODIUM 2 MG PO TABS
4.0000 mg | ORAL_TABLET | Freq: Once | ORAL | Status: AC
  Administered 2020-06-06: 4 mg via ORAL
  Filled 2020-06-06: qty 2

## 2020-06-06 MED ORDER — PENTAFLUOROPROP-TETRAFLUOROETH EX AERO: 1.0000 "application " | INHALATION_SPRAY | CUTANEOUS | Status: DC | PRN

## 2020-06-06 MED ORDER — HEPARIN SODIUM (PORCINE) 1000 UNIT/ML DIALYSIS
4000.0000 [IU] | INTRAMUSCULAR | Status: DC | PRN
  Administered 2020-06-06: 4000 [IU] via INTRAVENOUS_CENTRAL

## 2020-06-06 MED ORDER — SODIUM CHLORIDE 0.9 % IV SOLN
2.0000 g | INTRAVENOUS | Status: DC
  Administered 2020-06-06 – 2020-06-08 (×3): 2 g via INTRAVENOUS
  Filled 2020-06-06 (×3): qty 20

## 2020-06-06 MED ORDER — ALBUMIN HUMAN 25 % IV SOLN: 25.0000 g | Freq: Once | INTRAVENOUS | Status: DC

## 2020-06-06 MED ORDER — HEPARIN SODIUM (PORCINE) 1000 UNIT/ML IJ SOLN
INTRAMUSCULAR | Status: AC
  Filled 2020-06-06: qty 4

## 2020-06-06 MED ORDER — HEPARIN SODIUM (PORCINE) 1000 UNIT/ML DIALYSIS: 1000.0000 [IU] | INTRAMUSCULAR | Status: DC | PRN

## 2020-06-06 MED ORDER — DIGOXIN 125 MCG PO TABS
0.1250 mg | ORAL_TABLET | ORAL | Status: DC
  Administered 2020-06-06 – 2020-06-09 (×3): 0.125 mg via ORAL
  Filled 2020-06-06 (×4): qty 1

## 2020-06-06 MED ORDER — LIDOCAINE-PRILOCAINE 2.5-2.5 % EX CREA: 1.0000 "application " | TOPICAL_CREAM | CUTANEOUS | Status: DC | PRN

## 2020-06-06 MED ORDER — HEPARIN SODIUM (PORCINE) 1000 UNIT/ML IJ SOLN: 4000.0000 [IU] | Freq: Once | INTRAMUSCULAR | Status: DC

## 2020-06-06 MED ORDER — CHLORHEXIDINE GLUCONATE CLOTH 2 % EX PADS
6.0000 | MEDICATED_PAD | Freq: Every day | CUTANEOUS | Status: DC
  Administered 2020-06-06 – 2020-06-08 (×2): 6 via TOPICAL

## 2020-06-06 NOTE — Progress Notes (Signed)
Patient arrived back from HD. Patient is alert and oriented x4 and expresses that he is hungry but no pain. Order patients dinner and administered all scheduled medications that were due when the patient was in HD.

## 2020-06-06 NOTE — Progress Notes (Signed)
[name] for warfarin Indication: atrial fibrillation  Allergies  Allergen Reactions  . Ace Inhibitors Itching, Cough and Other (See Comments)    Patient Measurements: Height: 6\' 1"  (185.4 cm) Weight: 111.1 kg (244 lb 14.9 oz) IBW/kg (Calculated) : 79.9  Vital Signs: Temp: 98.4 F (36.9 C) (01/13 2144) Temp Source: Oral (01/13 2144) BP: 97/76 (01/13 2144) Pulse Rate: 120 (01/13 2144)  Labs: Recent Labs    06/04/20 1730 06/04/20 1741 06/04/20 1741 06/05/20 0114 06/05/20 0613 06/06/20 0553  HGB  --  12.2*   < >  --  10.3* 10.2*  HCT  --  39.0  --   --  33.1* 32.7*  PLT  --  223  --   --  173 184  APTT  --   --   --  53*  --   --   LABPROT 20.3*  --   --   --   --  26.9*  INR 1.8*  --   --   --   --  2.6*  CREATININE  --  6.38*  --   --  7.28* 9.61*   < > = values in this interval not displayed.    Estimated Creatinine Clearance: 11.1 mL/min (A) (by C-G formula based on SCr of 9.61 mg/dL (H)).   Medical History: Past Medical History:  Diagnosis Date  . Acute lower GI bleeding 06/21/2018  . Acute respiratory failure with hypoxia ([place]) 01/13/2015  . Atrial flutter ([place])   . Bile leak, postoperative 03/15/2016  . Biliary dyskinesia 02/12/2016  . 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 Cardiomyopathy   . CHF (congestive heart failure) ([place])   . Coronary artery disease    normal coronaries by 10/10/08 cath, Cardiac Cath 08-04-12 epic.Dr. Doylene Canard follows  . Diabetes mellitus    pt. states he's borderline diabetic., no longer taking med,- off med. since 2013  . Dialysis patient [facility])    Mon-Wed-Fri([place])- Left AV fistula  . Diverticulitis November 2016   reoccurred in December 2016  . Diverticulitis of intestine without perforation or abscess without bleeding 04/21/2015  . DVT (deep venous thrombosis) ([place])   . ESRD (end stage renal disease) ([place])   . GERD (gastroesophageal reflux disease)    . Gout   . Hemorrhage of left kidney 01/13/2018  . History of nephrectomy 07/04/2012   History of right nephrectomy in 2002 for renal cell carcinoma   . History of unilateral nephrectomy   . Hypertension   . Low iron   . Myocardial infarction [facility]) ?2006  . Renal cell carcinoma    dialysis- MWF- [address]- Dr. Mercy Moore follows.  . Renal insufficiency   . Shortness of breath 05/19/11   "at rest, lying down, w/exertion"  . Stroke [facility]) 02/2011   05/19/11 denies residual  . Umbilical hernia 09/81/19   unrepaired  . Wears glasses     Medications: Warfarin 7.5 mg PO daily PTA Last dose: 06/03/20 PTA  Assessment: Pt is a 12 yoM presenting with hypotension and sepsis.  Pharmacy consulted for warfarin dosing, which patient was on PTA for atrial fibrillation. Most of the INRs in EHR are subtherapeutic.   Not significant drug interaction with metronidazole, anticipate delayed impact on INR with amiodarone. INR 1.8 on admit- now up to 2.6 today, within goal range. Oral intake documented at 25% yesterday.  Warfarin PTA dose 7.5mg  daily    Goal of Therapy:  INR 2-3 Monitor platelets by anticoagulation protocol: Yes   Plan:  Warfarin 4 mg tonight  Monitor daily INR, CBC/plt Monitor for signs/symptoms of bleeding  F/u metronidazole duration and interaction   Antonietta Jewel, PharmD, [place] Pharmacist  Phone: [phone] 06/06/2020 8:08 AM  Please check AMION for all [place] phone numbers After 10:00 PM, call [place] [phone]

## 2020-06-06 NOTE — Progress Notes (Signed)
PROGRESS NOTE  [name]  DOB: 1960/11/03  PCP: Glendale Chard, MD LEX:[id number]  DOA: 06/04/2020  LOS: 2 days   Chief complaint: Fatigue  Brief narrative: [name] is a 60 y.o. male with PMH significant for ESRD-HD-MWF, DM 2, HTN, HLD, chronic systolic CHF, atrial fibrillation on warfarin, and iron deficiency anemia, chronic pain.  On 1/12, patient was at dialysis where he completed his treatment but was then noted to be tachycardic and hypotensive and sent to the emergency department for evaluation of this.   Patient reports a nonproductive cough for several months now despite treatment with antibiotics and steroids, reports testing negative for COVID recently, has received 3 doses of COVID-vaccine.  Also reportsnausea with nonbloody vomiting as well as diarrhea for roughly 2 weeks now.  There is some mild generalized abdominal discomfort associated with this that improves for a while after having a loose stool.   In the ED, patient was afebrile, tachycardic to 449Q, and with systolic blood pressure was low ranging from 66-82.  EKG showed atrial flutter with rate 124.   Chest x-ray showed cardiomegaly with vascular congestion.   C diff negative, GI pathogen negative. COVID negative.   Subjective: Patient reports that he still has diarrhea but denies abdominal pain. His BP is soft but has improved. His lactic acid was still above 2, receiving gentle IV fluid.   Assessment/Plan: Severe sepsis with hypotension -Presented with diarrhea, tachycardia, hypotension, lactic acidosis.   -He reports ongoing diarrhea and vomiting for last 2 to 3 weeks.  Probably viral gastroenteritis causing severe dehydration leading to hypotension, lactic acidosis. -But he is also immunocompromised due to ESRD status.  I would agree treating him as severe sepsis at this time. -Is currently on IV cefepime and IV vancomycin. -I do not think patient has gotten enough IV fluid so far.  I will give him a  liter of bolus this morning.  If continues to remain hypotensive intake adequate, need further escalation of care for potential septic shock.  -May need critical care consultation if blood pressure does not improve. 1/14 Blood pressure has improved. Lactic acid remains elevated. Blood cultures with no growth so far.  Deescalate Abx to Rocephin and flagyl and monitor. Continue midodrine. Continue gentle IV fluids.   Nausea, vomiting  Resolved.   Diarrhea  -Patient reports 2 weeks of loose stools with mild abd discomfort and some N/V  -He has been on antibiotics recently for URI  -C. difficile and GI pathogen panel ordered.   -Start Imodium scheduled for next 2 days and Zofran as needed. 1/14 C diff and GI pathogen negative. Denies abdominal pain. Continue imodium.   Chronic cough -Ongoing dry cough for last 2 to 3 weeks -Chest x-ray on admission showed mild vascular congestion. -We will start the patient on Tessalon Perles scheduled. -Not in respiratory distress except for when he has severe bouts of cough.  Chronic systolic CHF  Chronic hypotension -EF was 35-40% in 2020  -CHF appears compensated.  Last echo from July 2020 with EF 35 to 40%.  Obtain repeat echo. -Patient states that his blood pressure lately has been running low because of which his CHF meds were held.  He is also on midodrine.  I would continue midodrine 10 mg 3 times daily at this time.  Continue to monitor blood pressure. 1/14 Having HD today per Nephrology. No dyspnea.   ESRD-HD-MWF -Completed HD 06/04/20.  -Continue binders, judicious with fluid, renally-dose medications   -We will involve  nephrology in house. No need of urgent dialysis today. 1/14 Had HD today.   Atrial fibrillation with RVR  -Rate 120s on admission, secondary to sepsis/dehydration. -May benefit from amiodarone/digoxin.  Patient's cardiologist Dr. Doylene Canard informed. -check INR and continue warfarin with pharmacy assistance .  1/14 HR  improved, in the low 100s now. Will continue telemetry.   Chronic pain  -No pain complaints on admission  -Hold opiates until BP improves    Mobility: Encourage ambulation Code Status:   Code Status: Full Code  Nutritional status: Body mass index is 32.31 kg/m. Nutrition Problem: Moderate Malnutrition Etiology: chronic illness (ESRD, CHF) Signs/Symptoms: moderate muscle depletion,mild muscle depletion,energy intake < 75% for > or equal to 1 month Diet Order            Diet renal with fluid restriction Fluid restriction: 1200 mL Fluid; Room service appropriate? Yes; Fluid consistency: Thin  Diet effective now                 DVT prophylaxis: Place and maintain sequential compression device Start: 06/05/20 1005 warfarin (COUMADIN) tablet 4 mg    Antimicrobials:  IV cefepime/vancomycin Fluid: Bolus of LR 1 L this morning Consultants: Cardiologist, nephrology Family Communication:  Not at bedside  Status is: Inpatient  Remains inpatient appropriate because:Hemodynamically unstable   Dispo: The patient is from: Home              Anticipated d/c is to: Home              Anticipated d/c date is: > 3 days              Patient currently is not medically stable to d/c.       Infusions:  . sodium chloride    . sodium chloride    . albumin human    . cefTRIAXone (ROCEPHIN)  IV 2 g (06/06/20 0844)  . metronidazole 500 mg (06/06/20 1200)    Scheduled Meds: . amiodarone  200 mg Oral BID  . benzonatate  200 mg Oral TID  . calcium acetate  2,001 mg Oral TID WC  . Chlorhexidine Gluconate Cloth  6 each Topical Q0600  . cinacalcet  180 mg Oral Daily  . diclofenac Sodium  2 g Topical QID  . digoxin  0.25 mg Intravenous Daily  . feeding supplement (NEPRO CARB STEADY)  237 mL Oral Daily  . fluticasone furoate-vilanterol  1 puff Inhalation BID  . guaiFENesin  600 mg Oral BID  . heparin sodium (porcine)      . heparin sodium (porcine)  4,000 Units Intravenous Once  .  loperamide  2 mg Oral Q6H  . metoprolol succinate  25 mg Oral Daily  . midodrine  10 mg Oral TID WC  . pantoprazole  40 mg Oral BID  . sucroferric oxyhydroxide  1,000 mg Oral TID WC  . warfarin  4 mg Oral ONCE-1600  . Warfarin - Pharmacist Dosing Inpatient   Does not apply q1600    Antimicrobials: Anti-infectives (From admission, onward)   Start     Dose/Rate Route Frequency Ordered Stop   06/06/20 1200  vancomycin (VANCOCIN) IVPB 1000 mg/200 mL premix  Status:  Discontinued        1,000 mg 200 mL/hr over 60 Minutes Intravenous Every M-W-F (Hemodialysis) 06/04/20 2132 06/06/20 0821   06/06/20 1200  ceFEPIme (MAXIPIME) 2 g in sodium chloride 0.9 % 100 mL IVPB  Status:  Discontinued        2 g 200  mL/hr over 30 Minutes Intravenous Every M-W-F (Hemodialysis) 06/04/20 2132 06/06/20 0821   06/06/20 0915  cefTRIAXone (ROCEPHIN) 2 g in sodium chloride 0.9 % 100 mL IVPB        2 g 200 mL/hr over 30 Minutes Intravenous Every 24 hours 06/06/20 0821     06/05/20 0400  metroNIDAZOLE (FLAGYL) IVPB 500 mg        500 mg 100 mL/hr over 60 Minutes Intravenous Every 8 hours 06/04/20 2112     06/04/20 2230  vancomycin (VANCOCIN) 2,500 mg in sodium chloride 0.9 % 500 mL IVPB        2,500 mg 250 mL/hr over 120 Minutes Intravenous  Once 06/04/20 2125 06/05/20 0059   06/04/20 1945  vancomycin (VANCOREADY) IVPB 2000 mg/400 mL  Status:  Discontinued        2,000 mg 200 mL/hr over 120 Minutes Intravenous  Once 06/04/20 1914 06/04/20 2125   06/04/20 1915  ceFEPIme (MAXIPIME) 2 g in sodium chloride 0.9 % 100 mL IVPB        2 g 200 mL/hr over 30 Minutes Intravenous  Once 06/04/20 1910 06/04/20 2033   06/04/20 1915  metroNIDAZOLE (FLAGYL) IVPB 500 mg        500 mg 100 mL/hr over 60 Minutes Intravenous  Once 06/04/20 1910 06/04/20 2138   06/04/20 1915  vancomycin (VANCOCIN) IVPB 1000 mg/200 mL premix  Status:  Discontinued        1,000 mg 200 mL/hr over 60 Minutes Intravenous  Once 06/04/20 1910 06/04/20 1914       PRN meds: sodium chloride, sodium chloride, acetaminophen **OR** acetaminophen, albuterol, alteplase, calcium acetate, gabapentin, heparin, [START ON 06/07/2020] heparin, lidocaine (PF), lidocaine-prilocaine, midodrine, ondansetron **OR** ondansetron (ZOFRAN) IV, pentafluoroprop-tetrafluoroeth, sucroferric oxyhydroxide   Objective: Vitals:   06/06/20 1615 06/06/20 1630  BP: 98/77 98/72  Pulse: 85 (!) 113  Resp: 18 18  Temp:    SpO2: 99% 100%    Intake/Output Summary (Last 24 hours) at 06/06/2020 1723 Last data filed at 06/06/2020 1300 Gross per 24 hour  Intake 720 ml  Output 1 ml  Net 719 ml   Filed Weights   06/05/20 0300 06/06/20 0517  Weight: 109.3 kg 111.1 kg   Weight change: 1.8 kg Body mass index is 32.31 kg/m.   Physical Exam: General exam: Pleasant, sitting up in bed, in NAD.  Skin: No rashes, lesions or ulcers. HEENT: Atraumatic, normocephalic Lungs: No cough, normal rate CVS: Irregular, tachycardic GI/Abd soft, nontender, nondistended CNS: Alert, awake, noted x3 Psychiatry: Mood appropriate Extremities: No pedal edema, no calf tenderness  Data Review: I have personally reviewed the laboratory data and studies available.  Recent Labs  Lab 06/04/20 1741 06/05/20 0613 06/06/20 0553 06/06/20 1500  WBC 7.0 6.4 5.8 6.2  HGB 12.2* 10.3* 10.2* 9.3*  HCT 39.0 33.1* 32.7* 28.3*  MCV 96.5 96.2 95.1 94.3  PLT 223 173 184 181   Recent Labs  Lab 06/04/20 1739 06/04/20 1741 06/05/20 0613 06/06/20 0553 06/06/20 1501  NA  --  135 134* 133* 134*  K  --  4.5 4.2 3.9 4.0  CL  --  91* 94* 94* 95*  CO2  --  28 26 23 23   GLUCOSE  --  88 90 92 107*  BUN  --  27* 29* 38* 45*  CREATININE  --  6.38* 7.28* 9.61* 10.32*  CALCIUM  --  9.5 9.3 9.0 9.1  MG 2.3  --   --   --   --  PHOS  --   --   --   --  6.6*    F/u labs ordered  Signed, Blain Pais, MD [facility] 06/06/2020

## 2020-06-06 NOTE — Progress Notes (Signed)
Lab called with a critical lactic acid of 2.3 Paged MD to notify.

## 2020-06-06 NOTE — Progress Notes (Addendum)
[facility] Progress Note   Subjective:   Patient seen and examined at bedside.  Continues to have vomiting, diarrhea and weakness.  Reports he weighed earlier and is now 4kg over his dry weight.  Denies CP, SOB and orthopnea.   Objective Vitals:   06/05/20 2144 06/06/20 0517 06/06/20 0838 06/06/20 0845  BP: 97/76  97/80   Pulse: (!) 120  (!) 109   Resp: 20  16   Temp: 98.4 F (36.9 C)  (!) 97.5 F (36.4 C)   TempSrc: Oral  Oral   SpO2: 99%  99% 99%  Weight:  111.1 kg    Height:       Physical Exam General:WDWN male in NAD Heart:+tachycardia, regular rhythm Lungs: mostly CTAB, breath sounds diminished Abdomen:soft, NTND Extremities:no LE edema Dialysis Access: LU AVF +b/t   Filed Weights   06/05/20 0300 06/06/20 0517  Weight: 109.3 kg 111.1 kg    Intake/Output Summary (Last 24 hours) at 06/06/2020 1155 Last data filed at 06/06/2020 1044 Gross per 24 hour  Intake 480 ml  Output 1 ml  Net 479 ml    Additional Objective Labs: Basic Metabolic Panel: Recent Labs  Lab 06/04/20 1741 06/05/20 0613 06/06/20 0553  NA 135 134* 133*  K 4.5 4.2 3.9  CL 91* 94* 94*  CO2 28 26 23   GLUCOSE 88 90 92  BUN 27* 29* 38*  CREATININE 6.38* 7.28* 9.61*  CALCIUM 9.5 9.3 9.0   Liver Function Tests: Recent Labs  Lab 06/05/20 0114  AST 10*  ALT 9  ALKPHOS 104  BILITOT 0.8  PROT 7.9  ALBUMIN 3.3*   CBC: Recent Labs  Lab 06/04/20 1741 06/05/20 0613 06/06/20 0553  WBC 7.0 6.4 5.8  HGB 12.2* 10.3* 10.2*  HCT 39.0 33.1* 32.7*  MCV 96.5 96.2 95.1  PLT 223 173 184   Medications: . sodium chloride    . sodium chloride    . cefTRIAXone (ROCEPHIN)  IV 2 g (06/06/20 0844)  . metronidazole 500 mg (06/06/20 0545)   . amiodarone  200 mg Oral BID  . benzonatate  200 mg Oral TID  . calcium acetate  2,001 mg Oral TID WC  . Chlorhexidine Gluconate Cloth  6 each Topical Q0600  . cinacalcet  180 mg Oral Daily  . diclofenac Sodium  2 g Topical QID  . digoxin   0.25 mg Intravenous Daily  . feeding supplement (NEPRO CARB STEADY)  237 mL Oral Daily  . fluticasone furoate-vilanterol  1 puff Inhalation BID  . guaiFENesin  600 mg Oral BID  . loperamide  2 mg Oral Q6H  . metoprolol succinate  25 mg Oral Daily  . midodrine  10 mg Oral TID WC  . pantoprazole  40 mg Oral BID  . sucroferric oxyhydroxide  1,000 mg Oral TID WC  . warfarin  4 mg Oral ONCE-1600  . Warfarin - Pharmacist Dosing Inpatient   Does not apply q1600    Dialysis Orders: MWF - [facility]  4hrs, BFR 450, DFR 800,  EDW 107kg, 2K/ 2Ca Profile 2  Access: LU AVF Heparin 4000 bolus venofer 50mg  qwk Mircera  69mcg q2wks - last 06/02/20 Hectorol 6mcg IV qHD    Assessment/Plan: 1.  Hypotension - on midodrine chronically. Has been given>3L IV fluids with little improvement. Chronic issue.  Stopped lactated ringers continuous IV, would not keep giving IV fluids.  Already 4L over dry weight. ECHO w/EF 25-30%. TSH normal. wkup ongoing. Pt has low BP's at baseline, <  100.  2. A fib - using digoxin/amiodarone per cardio 3. Diarrhea - x2wks. C diff negative. ?viral gastroenteritis  4.  ESRD -  On HD MWF.  Orders written for HD today per regular schedule. K 4.2. 5. Volume  - patient feels like he has lost weight and has been SOB but volume removal complicated by hypotension.  Does not appear grossly volume overloaded although is now 4kg over his dry weight. CXR showed mild vascular congestion.  Continue midodrine 10mg  TID w/extra dose pre HD. Plan for UF 3L.  Get standing weights. Use albumin prn for hypotension. 6.  Anemia of CKD - Hgb 10.3 - ESA recently dosed.  7.  Secondary Hyperparathyroidism -  Ca in goal. Will check phos. Continue VDRA, binders 8.  Nutrition - Renal diet w/fluid restrictions. 9. DMT2 - per primary    Jen Mow, PA-C [facility] 06/06/2020,11:55 AM  LOS: 2 days   Pt seen, examined and agree w assess/plan as above with additions as indicated.   [facility] 06/06/2020, 1:23 PM

## 2020-06-06 NOTE — Consult Note (Signed)
Ref: Glendale Chard, MD   Subjective:  Awake. HR in 100-120/min Tolerated hemodialysis without drop in BP and had 2.5 liter pulled. Discussed with wife about poor LV systolic function, massive LA and RA size and atrial flutter.  Objective:  Vital Signs in the last 24 hours: Temp:  [97.5 F (36.4 C)-98.4 F (36.9 C)] 97.9 F (36.6 C) (01/14 1336) Pulse Rate:  [83-120] 113 (01/14 1630) Cardiac Rhythm: Atrial flutter;Bundle branch block (01/14 0705) Resp:  [16-20] 18 (01/14 1630) BP: (89-112)/(71-84) 98/72 (01/14 1630) SpO2:  [99 %-100 %] 100 % (01/14 1630) Weight:  [111.1 kg] 111.1 kg (01/14 0517)  Physical Exam: BP Readings from Last 1 Encounters:  06/06/20 98/72     Wt Readings from Last 1 Encounters:  06/06/20 111.1 kg    Weight change: 1.8 kg Body mass index is 32.31 kg/m. HEENT: [place]/AT, Eyes-Brown, PERL, EOMI, Conjunctiva-Pale pink, Sclera-Non-icteric Neck: + JVD, No bruit, Trachea midline. Lungs:  Clear, Bilateral. Cardiac: Irregular rhythm, normal S1 and S2, no S3. III/VI systolic murmur. Abdomen:  Soft, non-tender. BS present. Extremities:  No edema present. No cyanosis. No clubbing. CNS: AxOx3, Cranial nerves grossly intact, moves all 4 extremities.  Skin: Warm and dry.   Intake/Output from previous day: 01/13 0701 - 01/14 0700 In: 840 [P.O.:840] Out: -     Lab Results: BMET    Component Value Date/Time   NA 134 (L) 06/06/2020 1501   NA 133 (L) 06/06/2020 0553   NA 134 (L) 06/05/2020 0613   NA 138 11/15/2019 1012   K 4.0 06/06/2020 1501   K 3.9 06/06/2020 0553   K 4.2 06/05/2020 0613   CL 95 (L) 06/06/2020 1501   CL 94 (L) 06/06/2020 0553   CL 94 (L) 06/05/2020 0613   CO2 23 06/06/2020 1501   CO2 23 06/06/2020 0553   CO2 26 06/05/2020 0613   GLUCOSE 107 (H) 06/06/2020 1501   GLUCOSE 92 06/06/2020 0553   GLUCOSE 90 06/05/2020 0613   BUN 45 (H) 06/06/2020 1501   BUN 38 (H) 06/06/2020 0553   BUN 29 (H) 06/05/2020 0613   BUN 33 (H) 11/15/2019  1012   CREATININE 10.32 (H) 06/06/2020 1501   CREATININE 9.61 (H) 06/06/2020 0553   CREATININE 7.28 (H) 06/05/2020 0613   CALCIUM 9.1 06/06/2020 1501   CALCIUM 9.0 06/06/2020 0553   CALCIUM 9.3 06/05/2020 0613   GFRNONAA 5 (L) 06/06/2020 1501   GFRNONAA 6 (L) 06/06/2020 0553   GFRNONAA 8 (L) 06/05/2020 0613   GFRAA 6 (L) 11/15/2019 1012   GFRAA 5 (L) 01/19/2019 0631   GFRAA 6 (L) 01/18/2019 1300   CBC    Component Value Date/Time   WBC 6.2 06/06/2020 1500   RBC 3.00 (L) 06/06/2020 1500   HGB 9.3 (L) 06/06/2020 1500   HGB 12.0 (L) 11/09/2018 0936   HCT 28.3 (L) 06/06/2020 1500   HCT 35.0 (L) 11/09/2018 0936   PLT 181 06/06/2020 1500   PLT 237 11/09/2018 0936   MCV 94.3 06/06/2020 1500   MCV 91 11/09/2018 0936   MCH 31.0 06/06/2020 1500   MCHC 32.9 06/06/2020 1500   RDW 18.9 (H) 06/06/2020 1500   RDW 15.4 11/09/2018 0936   LYMPHSABS 0.7 04/24/2020 2320   MONOABS 0.9 04/24/2020 2320   EOSABS 0.0 04/24/2020 2320   BASOSABS 0.0 04/24/2020 2320   HEPATIC Function Panel Recent Labs    11/15/19 1012 04/24/20 2320 06/05/20 0114  PROT 7.3 8.0 7.9   HEMOGLOBIN A1C No components found  for: HGA1C,  MPG CARDIAC ENZYMES Lab Results  Component Value Date   CKTOTAL 136 03/16/2017   CKMB 4.3 (H) 05/20/2011   TROPONINI 0.20 (HH) 01/13/2018   TROPONINI 0.21 (HH) 01/13/2018   TROPONINI 0.27 (HH) 01/13/2018   BNP No results for input(s): PROBNP in the last 8760 hours. TSH Recent Labs    06/05/20 1111  TSH 1.591   CHOLESTEROL Recent Labs    11/15/19 1012 06/05/20 1111  CHOL 200* 115    Scheduled Meds: . amiodarone  200 mg Oral BID  . benzonatate  200 mg Oral TID  . calcium acetate  2,001 mg Oral TID WC  . Chlorhexidine Gluconate Cloth  6 each Topical Q0600  . cinacalcet  180 mg Oral Daily  . diclofenac Sodium  2 g Topical QID  . digoxin  0.25 mg Intravenous Daily  . feeding supplement (NEPRO CARB STEADY)  237 mL Oral Daily  . fluticasone furoate-vilanterol  1  puff Inhalation BID  . guaiFENesin  600 mg Oral BID  . heparin sodium (porcine)      . heparin sodium (porcine)  4,000 Units Intravenous Once  . loperamide  2 mg Oral Q6H  . metoprolol succinate  25 mg Oral Daily  . midodrine  10 mg Oral TID WC  . pantoprazole  40 mg Oral BID  . sucroferric oxyhydroxide  1,000 mg Oral TID WC  . warfarin  4 mg Oral ONCE-1600  . Warfarin - Pharmacist Dosing Inpatient   Does not apply q1600   Continuous Infusions: . sodium chloride    . sodium chloride    . albumin human    . cefTRIAXone (ROCEPHIN)  IV 2 g (06/06/20 0844)  . metronidazole 500 mg (06/06/20 1200)   PRN Meds:.sodium chloride, sodium chloride, acetaminophen **OR** acetaminophen, albuterol, alteplase, calcium acetate, gabapentin, heparin, [START ON 06/07/2020] heparin, lidocaine (PF), lidocaine-prilocaine, midodrine, ondansetron **OR** ondansetron (ZOFRAN) IV, pentafluoroprop-tetrafluoroeth, sucroferric oxyhydroxide  Assessment/Plan: Atrial flutter with controlled ventricular rate Dilated cardiomyopathy Acute on chronic systolic left heart failure ESRD Obesity Type 2 DM Dehydration, stable Dizziness Long term anticoagulation use.  Continue loading amiodarone. Change lanoxin to 0.125 mg. PO on M-W-F. INR goal is 1.8 to 2.2(2.0) as he bleeds easily over INR 2.5.   LOS: 2 days   Time spent including chart review, lab review, examination, discussion with patient/Nurse/Family/Pharmacy : 30 min   Dixie Dials  MD  06/06/2020, 5:41 PM

## 2020-06-06 NOTE — ED Notes (Addendum)
Slippers given to pts sister Arthuro Canelo as they were left here when pt was transferred to [facility]. Opened chart at sisters request to find slippers

## 2020-06-07 LAB — BASIC METABOLIC PANEL
Anion gap: 15 (ref 5–15)
BUN: 23 mg/dL — ABNORMAL HIGH (ref 6–20)
CO2: 25 mmol/L (ref 22–32)
Calcium: 8.6 mg/dL — ABNORMAL LOW (ref 8.9–10.3)
Chloride: 95 mmol/L — ABNORMAL LOW (ref 98–111)
Creatinine, Ser: 7.15 mg/dL — ABNORMAL HIGH (ref 0.61–1.24)
GFR, Estimated: 8 mL/min — ABNORMAL LOW (ref >60)
Glucose, Bld: 87 mg/dL (ref 70–99)
Potassium: 4.3 mmol/L (ref 3.5–5.1)
Sodium: 135 mmol/L (ref 135–145)

## 2020-06-07 LAB — CBC
HCT: 32.5 % — ABNORMAL LOW (ref 39.0–52.0)
Hemoglobin: 10.1 g/dL — ABNORMAL LOW (ref 13.0–17.0)
MCH: 30 pg (ref 26.0–34.0)
MCHC: 31.1 g/dL (ref 30.0–36.0)
MCV: 96.4 fL (ref 80.0–100.0)
Platelets: 194 10*3/uL (ref 150–400)
RBC: 3.37 MIL/uL — ABNORMAL LOW (ref 4.22–5.81)
RDW: 19.1 % — ABNORMAL HIGH (ref 11.5–15.5)
WBC: 5.4 10*3/uL (ref 4.0–10.5)
nRBC: 0.7 % — ABNORMAL HIGH (ref 0.0–0.2)

## 2020-06-07 LAB — PROTIME-INR
INR: 2.6 — ABNORMAL HIGH (ref 0.8–1.2)
Prothrombin Time: 27 seconds — ABNORMAL HIGH (ref 11.4–15.2)

## 2020-06-07 MED ORDER — WARFARIN SODIUM 3 MG PO TABS
3.0000 mg | ORAL_TABLET | Freq: Once | ORAL | Status: AC
  Administered 2020-06-07: 3 mg via ORAL
  Filled 2020-06-07: qty 1

## 2020-06-07 NOTE — Progress Notes (Signed)
   06/07/20 0725  Assess: MEWS Score  Temp (!) 97.4 F (36.3 C) (patient just drunk juice)  BP (!) 78/59  ECG Heart Rate 89  Resp 17  Level of Consciousness Alert  SpO2 100 %  O2 Device Room Air  Assess: MEWS Score  MEWS Temp 0  MEWS Systolic 2  MEWS Pulse 0  MEWS RR 0  MEWS LOC 0  MEWS Score 2  MEWS Score Color Yellow  Assess: if the MEWS score is Yellow or Red  Were vital signs taken at a resting state? Yes  Focused Assessment No change from prior assessment  Early Detection of Sepsis Score *See Row Information* Low  MEWS guidelines implemented *See Row Information* No, previously yellow, continue vital signs every 4 hours  Treat  MEWS Interventions Administered scheduled meds/treatments  Pain Scale 0-10  Pain Score 0  Document  Progress note created (see row info) Yes   Patient with Yellow MEWS for low BP no change to prior MEWS. Medication provided for BP. RN will continue to monitor.

## 2020-06-07 NOTE — Progress Notes (Signed)
[facility] Progress Note   Subjective:   Patient seen in room, diarrhea improving some. No new c/o. -2.5 L off w/ HD yesterday.   Objective Vitals:   06/07/20 0943 06/07/20 0957 06/07/20 1119 06/07/20 1240  BP:  (!) 83/63 92/65   Pulse: 92  88   Resp: 18 18 19    Temp:   97.6 F (36.4 C)   TempSrc:   Oral   SpO2:  100%    Weight:    109.7 kg  Height:       Physical Exam General:WDWN male in NAD Heart:+tachycardia, regular rhythm Lungs: mostly CTAB, breath sounds diminished Abdomen:soft, NTND Extremities:no LE edema Dialysis Access: LU AVF +b/t   Filed Weights   06/06/20 0517 06/07/20 0355 06/07/20 1240  Weight: 111.1 kg 109 kg 109.7 kg    Intake/Output Summary (Last 24 hours) at 06/07/2020 1505 Last data filed at 06/07/2020 0355 Gross per 24 hour  Intake 516.92 ml  Output 2506 ml  Net -1989.08 ml    Additional Objective Labs: Basic Metabolic Panel: Recent Labs  Lab 06/06/20 0553 06/06/20 1501 06/07/20 0357  NA 133* 134* 135  K 3.9 4.0 4.3  CL 94* 95* 95*  CO2 23 23 25   GLUCOSE 92 107* 87  BUN 38* 45* 23*  CREATININE 9.61* 10.32* 7.15*  CALCIUM 9.0 9.1 8.6*  PHOS  --  6.6*  --    Liver Function Tests: Recent Labs  Lab 06/05/20 0114 06/06/20 1501  AST 10*  --   ALT 9  --   ALKPHOS 104  --   BILITOT 0.8  --   PROT 7.9  --   ALBUMIN 3.3* 3.1*   CBC: Recent Labs  Lab 06/04/20 1741 06/05/20 0613 06/06/20 0553 06/06/20 1500 06/07/20 0357  WBC 7.0 6.4 5.8 6.2 5.4  HGB 12.2* 10.3* 10.2* 9.3* 10.1*  HCT 39.0 33.1* 32.7* 28.3* 32.5*  MCV 96.5 96.2 95.1 94.3 96.4  PLT 223 173 184 181 194   Medications: . albumin human    . cefTRIAXone (ROCEPHIN)  IV 2 g (06/07/20 1000)  . metronidazole 500 mg (06/07/20 1253)   . amiodarone  200 mg Oral BID  . benzonatate  200 mg Oral TID  . calcium acetate  2,001 mg Oral TID WC  . Chlorhexidine Gluconate Cloth  6 each Topical Q0600  . cinacalcet  180 mg Oral Daily  . diclofenac Sodium  2  g Topical QID  . digoxin  0.125 mg Oral Q M,W,F  . feeding supplement (NEPRO CARB STEADY)  237 mL Oral Daily  . fluticasone furoate-vilanterol  1 puff Inhalation BID  . guaiFENesin  600 mg Oral BID  . heparin sodium (porcine)  4,000 Units Intravenous Once  . metoprolol succinate  25 mg Oral Daily  . midodrine  10 mg Oral TID WC  . pantoprazole  40 mg Oral BID  . sucroferric oxyhydroxide  1,000 mg Oral TID WC  . warfarin  3 mg Oral ONCE-1600  . Warfarin - Pharmacist Dosing Inpatient   Does not apply q1600    [facility] HD:  [facility]   4h  450/800  107kg  2/2 bath P2  LUA AVF  Hep 4000 venofer 50mg  qwk Mircera  60mcg q2wks - last 06/02/20 Hectorol 68mcg IV qHD    Assessment/Plan: 1.  Hypotension - on midodrine chronically. Has been given>3L IV fluids with little improvement. Chronic issue.  Stopped lactated ringers continuous IV, would not keep giving IV fluids.  Already 4L  over dry weight. ECHO w/EF 25-30%. TSH normal. wkup ongoing. Usual BP's per patient are in the low 100's.  2. A fib - using digoxin/amiodarone per cardio 3. Diarrhea - x2wks. C diff negative. ?viral gastroenteritis  4.  ESRD -  On HD MWF. HD yest. Next HD tomorrow. Labs look good.  5. Volume  - up 2 kg today, next HD monday 6.  Anemia of CKD - Hgb 10.3 - ESA recently dosed.  7.  Secondary Hyperparathyroidism -  Ca in goal. Will check phos. Continue VDRA, binders 8.  Nutrition - Renal diet w/fluid restrictions. 9. DMT2 - per primary   Kelly Splinter, MD 06/07/2020, 3:07 PM

## 2020-06-07 NOTE — Plan of Care (Signed)
  Problem: Activity: Goal: Risk for activity intolerance will decrease Outcome: Progressing   Problem: Safety: Goal: Ability to remain free from injury will improve Outcome: Progressing   Problem: Activity: Goal: Capacity to carry out activities will improve Outcome: Progressing   

## 2020-06-07 NOTE — Progress Notes (Signed)
[place] for warfarin Indication: atrial fibrillation  Allergies  Allergen Reactions  . Ace Inhibitors Itching, Cough and Other (See Comments)    Patient Measurements: Height: 6\' 1"  (185.4 cm) Weight: 109 kg (240 lb 6.4 oz) IBW/kg (Calculated) : 79.9  Vital Signs: Temp: 97.4 F (36.3 C) (01/15 0725) Temp Source: Oral (01/15 0725) BP: 80/65 (01/15 0800) Pulse Rate: 92 (01/15 0943)  Labs: Recent Labs    06/04/20 1730 06/04/20 1741 06/05/20 0114 06/05/20 7096 06/06/20 0553 06/06/20 1500 06/06/20 1501 06/07/20 0357  HGB  --    < >  --    < > 10.2* 9.3*  --  10.1*  HCT  --    < >  --    < > 32.7* 28.3*  --  32.5*  PLT  --    < >  --    < > 184 181  --  194  APTT  --   --  53*  --   --   --   --   --   LABPROT 20.3*  --   --   --  26.9*  --   --  27.0*  INR 1.8*  --   --   --  2.6*  --   --  2.6*  CREATININE  --    < >  --    < > 9.61*  --  10.32* 7.15*   < > = values in this interval not displayed.    Estimated Creatinine Clearance: 14.8 mL/min (A) (by C-G formula based on SCr of 7.15 mg/dL (H)).   Medical History: Past Medical History:  Diagnosis Date  . Acute lower GI bleeding 06/21/2018  . Acute respiratory failure with hypoxia ([place]) 01/13/2015  . Atrial flutter ([place])   . Bile leak, postoperative 03/15/2016  . Biliary dyskinesia 02/12/2016  . 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 Cardiomyopathy   . CHF (congestive heart failure) ([place])   . Coronary artery disease    normal coronaries by 10/10/08 cath, Cardiac Cath 08-04-12 epic.Dr. Doylene Canard follows  . Diabetes mellitus    pt. states he's borderline diabetic., no longer taking med,- off med. since 2013  . Dialysis patient [facility])    Mon-Wed-Fri([place])- Left AV fistula  . Diverticulitis November 2016   reoccurred in December 2016  . Diverticulitis of intestine without perforation or abscess without bleeding 04/21/2015  . DVT (deep venous  thrombosis) ([place])   . ESRD (end stage renal disease) ([place])   . GERD (gastroesophageal reflux disease)   . Gout   . Hemorrhage of left kidney 01/13/2018  . History of nephrectomy 07/04/2012   History of right nephrectomy in 2002 for renal cell carcinoma   . History of unilateral nephrectomy   . Hypertension   . Low iron   . Myocardial infarction [facility]) ?2006  . Renal cell carcinoma    dialysis- MWF- [address]- Dr. Mercy Moore follows.  . Renal insufficiency   . Shortness of breath 05/19/11   "at rest, lying down, w/exertion"  . Stroke [facility]) 02/2011   05/19/11 denies residual  . Umbilical hernia 28/36/62   unrepaired  . Wears glasses     Medications: Warfarin 7.5 mg PO daily PTA Last dose: 06/03/20 PTA  Assessment: Pt is a 74 yoM presenting with hypotension and sepsis.  Pharmacy consulted for warfarin dosing, which patient was on PTA for atrial fibrillation. Most of the INRs in  EHR are subtherapeutic. Warfarin PTA dose 7.5mg  daily.  Not significant drug interaction with metronidazole, anticipate delayed impact on INR with amiodarone. INR 1.8 on admit- up to 2.6.   INR remains 2.6 today - technically above goal. Only 10% PO intake charted. Hgb stable ~9-10. Will reduce dose slightly to meet new INR goal but also remain therapeutic.    Goal of Therapy:  INR goal per cardiology 1.8-2.2 (bleeds easily with INR >2.5) Monitor platelets by anticoagulation protocol: Yes   Plan:  Reduce dose to warfarin 3 mg x1 tonight  Monitor daily INR, CBC/plt Monitor for signs/symptoms of bleeding  F/u metronidazole duration and interaction    Mercy Riding, PharmD PGY1 [place] Resident Please refer to [facility] for unit-specific pharmacist

## 2020-06-07 NOTE — ED Notes (Signed)
Pt family member came to [facility] looking for the pts gray sweater. Pt requesting staff open the pts chart to see if any staff wrote a note about where the belongings would be. This RN accessed chart to look for any notes that would indicate where the sweater would be. Pts belongings not found in department. Family states they understand and thanked [place] for looking.

## 2020-06-07 NOTE — Progress Notes (Addendum)
PROGRESS NOTE  [name]  DOB: [birth year]  PCP: Glendale Chard, MD YIR:[id number]  DOA: 06/04/2020  LOS: 3 days   Chief complaint: Fatigue  Brief narrative: [name] is a 60 y.o. male with PMH significant for ESRD-HD-MWF, DM 2, HTN, HLD, chronic systolic CHF, atrial fibrillation on warfarin, and iron deficiency anemia, chronic pain.  On 1/12, patient was at dialysis where he completed his treatment but was then noted to be tachycardic and hypotensive and sent to the emergency department for evaluation of this.   Patient reports a nonproductive cough for several months now despite treatment with antibiotics and steroids, reports testing negative for COVID recently, has received 3 doses of COVID-vaccine.  Also reportsnausea with nonbloody vomiting as well as diarrhea for roughly 2 weeks now.  There is some mild generalized abdominal discomfort associated with this that improves for a while after having a loose stool.   In the ED, patient was afebrile, tachycardic to 500X, and with systolic blood pressure was low ranging from 66-82.  EKG showed atrial flutter with rate 124.   Chest x-ray showed cardiomegaly with vascular congestion.   C diff negative, GI pathogen negative. COVID negative.   Subjective: 3 loose-soft stools so far today - npo pain, no bleeding. Feeling better  Assessment/Plan: Severe sepsis with hypotension -Presented with diarrhea, tachycardia, hypotension, lactic acidosis.   -He reports ongoing diarrhea and vomiting for last 2 to 3 weeks.  Probably viral gastroenteritis causing severe dehydration leading to hypotension, lactic acidosis. -But he is also immunocompromised due to ESRD status.  I would agree treating him as severe sepsis at this time. -Is currently on IV cefepime and IV vancomycin. -I do not think patient has gotten enough IV fluid so far.  I will give him a liter of bolus this morning.  If continues to remain hypotensive intake adequate, need further  escalation of care for potential septic shock.  -May need critical care consultation if blood pressure does not improve. 1/14 Blood pressure has improved. Lactic acid remains elevated. Blood cultures with no growth so far.  Deescalate Abx to Rocephin and flagyl and monitor. Continue midodrine. Continue gentle IV fluids.  1/15 No longer on IV fluids due to increase in dry weight, fluid now being removed with HD. Will recheck lactic acid. \ 1/16 much better stop Abx as Cxs neg  Nausea, vomiting  Resolved.   Diarrhea  -Patient reports 2 weeks of loose stools with mild abd discomfort and some N/V  -He has been on antibiotics recently for URI  -C. difficile and GI pathogen panel ordered.   -Start Imodium scheduled for next 2 days and Zofran as needed. 1/14 C diff and GI pathogen negative. Denies abdominal pain. Continue imodium.  1/15 Improving. Continue imodium.  1/16 better - etiology unclear - ? Viral with prolonged sxic phase.also ? If dig side effect - dose was reduced - will check level also  Chronic cough -Ongoing dry cough for last 2 to 3 weeks -Chest x-ray on admission showed mild vascular congestion. -We will start the patient on Tessalon Perles scheduled. -Not in respiratory distress except for when he has severe bouts of cough.  Chronic systolic CHF  Chronic hypotension -EF was 35-40% in 2020  -CHF appears compensated.  Last echo from July 2020 with EF 35 to 40%.  Obtain repeat echo. -Patient states that his blood pressure lately has been running low because of which his CHF meds were held.  He is also on midodrine.  I would continue midodrine 10 mg 3 times daily at this time.  Continue to monitor blood pressure. 1/14 Having HD today per Nephrology. No dyspnea.  1/15 Received IV fluids but with no dyspnea. Will continue HD.   ESRD-HD-MWF -Completed HD 06/04/20.  -Continue binders, judicious with fluid, renally-dose medications   -We will involve nephrology in house. No need  of urgent dialysis today. 1/14 Had HD today.  1/15 HD per Nephrology.  1/16 anticipate HD again tomorrow - ? Home after that  Atrial fibrillation with RVR  -Rate 120s on admission, secondary to sepsis/dehydration. -May benefit from amiodarone/digoxin.  Patient's cardiologist Dr. Doylene Canard informed. -check INR and continue warfarin with pharmacy assistance .  1/14 HR improved, in the low 100s now. Will continue telemetry.  1/15 INR goal is 1.8-2.5, appreciate pharmacy assistance for warfarin management. Unclear if he will tolerated metoprolol due to low BP. Remains on midodrine.  1/16  Lab Results  Component Value Date   INR 2.7 (H) 06/08/2020   INR 2.6 (H) 06/07/2020   INR 2.6 (H) 06/06/2020   PROTIME 29.1 (A) 09/12/2019   PROTIME 26.1 (A) 05/09/2019   PROTIME 26.2 (A) 03/15/2019   pharnacy managing slightly above goal - hopefully in line and home tomorrow unless INR way off  Chronic pain  -No pain complaints on admission  -Hold opiates until BP improves    Mobility: Encourage ambulation Code Status:   Code Status: Full Code  Nutritional status: Body mass index is 31.91 kg/m. Nutrition Problem: Moderate Malnutrition Etiology: chronic illness (ESRD, CHF) Signs/Symptoms: moderate muscle depletion,mild muscle depletion,energy intake < 75% for > or equal to 1 month Diet Order            Diet renal with fluid restriction Fluid restriction: 1200 mL Fluid; Room service appropriate? Yes; Fluid consistency: Thin  Diet effective now                 DVT prophylaxis:   warfarin tx for Afib Antimicrobials:  IV cefepime/vancomycin d/c 1/16 Fluid: Bolus of LR 1 L this morning Consultants: Cardiologist, nephrology Family Communication:  Not at bedside  Status is: Inpatient  Remains inpatient appropriate because:Hemodynamically unstable   Dispo: The patient is from: Home              Anticipated d/c is to: Home              Anticipated d/c date is: 1-2 days               Patient currently is not medically stable to d/c.       Infusions:  . albumin human    . cefTRIAXone (ROCEPHIN)  IV Stopped (06/07/20 1038)  . metronidazole Stopped (06/07/20 1355)    Scheduled Meds: . amiodarone  200 mg Oral BID  . benzonatate  200 mg Oral TID  . calcium acetate  2,001 mg Oral TID WC  . Chlorhexidine Gluconate Cloth  6 each Topical Q0600  . cinacalcet  180 mg Oral Daily  . diclofenac Sodium  2 g Topical QID  . digoxin  0.125 mg Oral Q M,W,F  . feeding supplement (NEPRO CARB STEADY)  237 mL Oral Daily  . fluticasone furoate-vilanterol  1 puff Inhalation BID  . guaiFENesin  600 mg Oral BID  . heparin sodium (porcine)  4,000 Units Intravenous Once  . metoprolol succinate  25 mg Oral Daily  . midodrine  10 mg Oral TID WC  . pantoprazole  40 mg Oral BID  .  sucroferric oxyhydroxide  1,000 mg Oral TID WC  . Warfarin - Pharmacist Dosing Inpatient   Does not apply q1600    Antimicrobials: Anti-infectives (From admission, onward)   Start     Dose/Rate Route Frequency Ordered Stop   06/06/20 1200  vancomycin (VANCOCIN) IVPB 1000 mg/200 mL premix  Status:  Discontinued        1,000 mg 200 mL/hr over 60 Minutes Intravenous Every M-W-F (Hemodialysis) 06/04/20 2132 06/06/20 0821   06/06/20 1200  ceFEPIme (MAXIPIME) 2 g in sodium chloride 0.9 % 100 mL IVPB  Status:  Discontinued        2 g 200 mL/hr over 30 Minutes Intravenous Every M-W-F (Hemodialysis) 06/04/20 2132 06/06/20 0821   06/06/20 0915  cefTRIAXone (ROCEPHIN) 2 g in sodium chloride 0.9 % 100 mL IVPB        2 g 200 mL/hr over 30 Minutes Intravenous Every 24 hours 06/06/20 0821     06/05/20 0400  metroNIDAZOLE (FLAGYL) IVPB 500 mg        500 mg 100 mL/hr over 60 Minutes Intravenous Every 8 hours 06/04/20 2112     06/04/20 2230  vancomycin (VANCOCIN) 2,500 mg in sodium chloride 0.9 % 500 mL IVPB        2,500 mg 250 mL/hr over 120 Minutes Intravenous  Once 06/04/20 2125 06/05/20 0059   06/04/20 1945   vancomycin (VANCOREADY) IVPB 2000 mg/400 mL  Status:  Discontinued        2,000 mg 200 mL/hr over 120 Minutes Intravenous  Once 06/04/20 1914 06/04/20 2125   06/04/20 1915  ceFEPIme (MAXIPIME) 2 g in sodium chloride 0.9 % 100 mL IVPB        2 g 200 mL/hr over 30 Minutes Intravenous  Once 06/04/20 1910 06/04/20 2033   06/04/20 1915  metroNIDAZOLE (FLAGYL) IVPB 500 mg        500 mg 100 mL/hr over 60 Minutes Intravenous  Once 06/04/20 1910 06/04/20 2138   06/04/20 1915  vancomycin (VANCOCIN) IVPB 1000 mg/200 mL premix  Status:  Discontinued        1,000 mg 200 mL/hr over 60 Minutes Intravenous  Once 06/04/20 1910 06/04/20 1914      PRN meds: acetaminophen **OR** acetaminophen, albuterol, calcium acetate, gabapentin, midodrine, ondansetron **OR** ondansetron (ZOFRAN) IV, sucroferric oxyhydroxide   Objective: Vitals:   06/07/20 0957 06/07/20 1119  BP: (!) 83/63 92/65  Pulse:  88  Resp: 18 19  Temp:  97.6 F (36.4 C)  SpO2: 100%     Intake/Output Summary (Last 24 hours) at 06/07/2020 1845 Last data filed at 06/07/2020 1739 Gross per 24 hour  Intake 761.76 ml  Output --  Net 761.76 ml   Filed Weights   06/06/20 0517 06/07/20 0355 06/07/20 1240  Weight: 111.1 kg 109 kg 109.7 kg   Weight change: -2.055 kg Body mass index is 31.91 kg/m.   Physical Exam: General exam: Pleasant, sitting up in bed, in NAD.  Skin: No rashes, lesions or ulcers. HEENT: Atraumatic, normocephalic Lungs: No cough, normal rate CVS: Irregular, tachycardic GI/Abd soft, nontender, nondistended CNS: Alert, awake, noted x3 Psychiatry: Mood appropriate Extremities: No pedal edema, no calf tenderness  Data Review: I have personally reviewed the laboratory data and studies available.  Recent Labs  Lab 06/04/20 1741 06/05/20 0613 06/06/20 0553 06/06/20 1500 06/07/20 0357  WBC 7.0 6.4 5.8 6.2 5.4  HGB 12.2* 10.3* 10.2* 9.3* 10.1*  HCT 39.0 33.1* 32.7* 28.3* 32.5*  MCV 96.5 96.2 95.1 94.3 96.4  PLT  223 173 184 181 194   Recent Labs  Lab 06/04/20 1739 06/04/20 1741 06/05/20 0613 06/06/20 0553 06/06/20 1501 06/07/20 0357  NA  --  135 134* 133* 134* 135  K  --  4.5 4.2 3.9 4.0 4.3  CL  --  91* 94* 94* 95* 95*  CO2  --  28 26 23 23 25   GLUCOSE  --  88 90 92 107* 87  BUN  --  27* 29* 38* 45* 23*  CREATININE  --  6.38* 7.28* 9.61* 10.32* 7.15*  CALCIUM  --  9.5 9.3 9.0 9.1 8.6*  MG 2.3  --   --   --   --   --   PHOS  --   --   --   --  6.6*  --     F/u labs ordered  Gatha Mayer, MD, [facility]  06/08/2020 1:15 PM

## 2020-06-07 NOTE — Progress Notes (Signed)
Subjective:  Patient denies any chest pain or shortness of breath complains of feeling weak and tired.  Remains in A. fib/flutter with moderate ventricular response heart rate in 90s to 100.  Blood pressure and heart rate improved  Objective:  Vital Signs in the last 24 hours: Temp:  [97.4 F (36.3 C)-98.1 F (36.7 C)] 97.6 F (36.4 C) (01/15 1119) Pulse Rate:  [83-119] 88 (01/15 1119) Resp:  [16-23] 19 (01/15 1119) BP: (77-112)/(59-84) 92/65 (01/15 1119) SpO2:  [95 %-100 %] 100 % (01/15 0957) Weight:  [161 kg] 109 kg (01/15 0355)  Intake/Output from previous day: 01/14 0701 - 01/15 0700 In: 996.9 [P.O.:840; IV Piggyback:156.9] Out: 2507 [Stool:1] Intake/Output from this shift: No intake/output data recorded.  Physical Exam: Neck: no adenopathy, no carotid bruit, no JVD and supple, symmetrical, trachea midline Lungs: clear to auscultation bilaterally Heart: irregularly irregular rhythm, S1, S2 normal and 2/6 systolic murmur noted Abdomen: soft, non-tender; bowel sounds normal; no masses,  no organomegaly Extremities: extremities normal, atraumatic, no cyanosis or edema  Lab Results: Recent Labs    06/06/20 1500 06/07/20 0357  WBC 6.2 5.4  HGB 9.3* 10.1*  PLT 181 194   Recent Labs    06/06/20 1501 06/07/20 0357  NA 134* 135  K 4.0 4.3  CL 95* 95*  CO2 23 25  GLUCOSE 107* 87  BUN 45* 23*  CREATININE 10.32* 7.15*   No results for input(s): TROPONINI in the last 72 hours.  Invalid input(s): CK, MB Hepatic Function Panel Recent Labs    06/05/20 0114 06/06/20 1501  PROT 7.9  --   ALBUMIN 3.3* 3.1*  AST 10*  --   ALT 9  --   ALKPHOS 104  --   BILITOT 0.8  --   BILIDIR <0.1  --   IBILI NOT CALCULATED  --    Recent Labs    06/05/20 1111  CHOL 115   No results for input(s): PROTIME in the last 72 hours.  Imaging: Imaging results have been reviewed and ECHOCARDIOGRAM COMPLETE  Result Date: 06/06/2020    ECHOCARDIOGRAM REPORT   Patient Name:   [name]  [place] Date of Exam: 06/05/2020 Medical Rec #:  [id number]     Height:       73.0 in Accession #:    [id number]    Weight:       241.0 lb Date of Birth:  1960/06/30     BSA:          2.329 m Patient Age:    60 years      BP:           96/66 mmHg Patient Gender: M             HR:           122 bpm. Exam Location:  Inpatient Procedure: 2D Echo, Cardiac Doppler and Color Doppler Indications:     Abnormal EKG  History:         Patient has prior history of Echocardiogram examinations, most                  recent 11/23/2018. CHF, CAD and Previous Myocardial Infarction,                  Arrythmias:Atrial Flutter; Risk Factors:Diabetes. ESRD, anemia.  Sonographer:     Dustin Flock RDCS Referring Phys:  [id number] [place] Diagnosing Phys: Dixie Dials MD IMPRESSIONS  1. Left ventricular ejection fraction, by estimation, is 25 to  30%. The left ventricle has severely decreased function. The left ventricle demonstrates global hypokinesis. The left ventricular internal cavity size was moderately dilated. There is mild concentric left ventricular hypertrophy. Left ventricular diastolic parameters are indeterminate.  2. Right ventricular systolic function is mildly reduced. The right ventricular size is normal. There is mildly elevated pulmonary artery systolic pressure.  3. Left atrial size was severely dilated.  4. Right atrial size was moderately dilated.  5. The mitral valve is degenerative. Severe mitral valve regurgitation. No evidence of mitral stenosis.  6. Tricuspid valve regurgitation is moderate.  7. The aortic valve is tricuspid. There is moderate calcification of the aortic valve. There is mild thickening of the aortic valve. Aortic valve regurgitation is mild. Mild to moderate aortic valve sclerosis/calcification is present, without any evidence of aortic stenosis.  8. The inferior vena cava is dilated in size with <50% respiratory variability, suggesting right atrial pressure of 15 mmHg. FINDINGS  Left Ventricle:  Left ventricular ejection fraction, by estimation, is 25 to 30%. The left ventricle has severely decreased function. The left ventricle demonstrates global hypokinesis. The left ventricular internal cavity size was moderately dilated. There is mild concentric left ventricular hypertrophy. Left ventricular diastolic parameters are indeterminate. Right Ventricle: The right ventricular size is normal. No increase in right ventricular wall thickness. Right ventricular systolic function is mildly reduced. There is mildly elevated pulmonary artery systolic pressure. The tricuspid regurgitant velocity  is 3.00 m/s, and with an assumed right atrial pressure of 8 mmHg, the estimated right ventricular systolic pressure is 78.9 mmHg. Left Atrium: Left atrial size was severely dilated. Right Atrium: Right atrial size was moderately dilated. Pericardium: There is no evidence of pericardial effusion. Mitral Valve: The mitral valve is degenerative in appearance. There is mild thickening of the mitral valve leaflet(s). There is mild calcification of the mitral valve leaflet(s). Mild mitral annular calcification. Severe mitral valve regurgitation. No evidence of mitral valve stenosis. Tricuspid Valve: The tricuspid valve is normal in structure. Tricuspid valve regurgitation is moderate. Aortic Valve: The aortic valve is tricuspid. There is moderate calcification of the aortic valve. There is mild thickening of the aortic valve. There is mild aortic valve annular calcification. Aortic valve regurgitation is mild. Aortic regurgitation PHT  measures 318 msec. Mild to moderate aortic valve sclerosis/calcification is present, without any evidence of aortic stenosis. Pulmonic Valve: The pulmonic valve was normal in structure. Pulmonic valve regurgitation is not visualized. Aorta: The aortic root is normal in size and structure. There is minimal (Grade I) atheroma plaque involving the aortic root and ascending aorta. Venous: The inferior  vena cava is dilated in size with less than 50% respiratory variability, suggesting right atrial pressure of 15 mmHg. IAS/Shunts: The interatrial septum was not well visualized.  LEFT VENTRICLE PLAX 2D LVIDd:         7.00 cm      Diastology LVIDs:         6.10 cm      LV e' medial:    3.37 cm/s LV PW:         1.40 cm      LV E/e' medial:  28.8 LV IVS:        1.30 cm      LV e' lateral:   8.05 cm/s LVOT diam:     2.70 cm      LV E/e' lateral: 12.0 LV SV:         62 LV SV Index:   27 LVOT Area:  5.73 cm  LV Volumes (MOD) LV vol d, MOD A4C: 290.0 ml LV vol s, MOD A4C: 205.0 ml LV SV MOD A4C:     290.0 ml RIGHT VENTRICLE RV Basal diam:  3.70 cm RV S prime:     11.20 cm/s TAPSE (M-mode): 2.7 cm LEFT ATRIUM              Index       RIGHT ATRIUM           Index LA diam:        6.60 cm  2.83 cm/m  RA Area:     24.80 cm LA Vol (A2C):   122.0 ml 52.38 ml/m RA Volume:   82.80 ml  35.55 ml/m LA Vol (A4C):   155.0 ml 66.55 ml/m LA Biplane Vol: 144.0 ml 61.83 ml/m  AORTIC VALVE LVOT Vmax:   73.90 cm/s LVOT Vmean:  49.500 cm/s LVOT VTI:    0.109 m AI PHT:      318 msec  AORTA Ao Root diam: 3.80 cm MITRAL VALVE               TRICUSPID VALVE MV Area (PHT): 5.97 cm    TR Peak grad:   36.0 mmHg MV Decel Time: 127 msec    TR Vmax:        300.00 cm/s MV E velocity: 97.00 cm/s MV A velocity: 43.20 cm/s  SHUNTS MV E/A ratio:  2.25        Systemic VTI:  0.11 m                            Systemic Diam: 2.70 cm Dixie Dials MD Electronically signed by Dixie Dials MD Signature Date/Time: 06/06/2020/9:45:24 AM    Final     Cardiac Studies:  Assessment/Plan:  Atrial fibrillation/flutter with controlled moderate ventricular response Nonischemic dilated cardiomyopathy Acute on chronic systolic left heart failure ESRD Anemia of chronic disease Obesity Type 2 DM Dehydration, stable Plan Continue present management Keep INR around 2.  LOS: 3 days    Charolette Forward 06/07/2020, 11:23 AM

## 2020-06-08 LAB — CBC
HCT: 32.6 % — ABNORMAL LOW (ref 39.0–52.0)
Hemoglobin: 10.7 g/dL — ABNORMAL LOW (ref 13.0–17.0)
MCH: 31.3 pg (ref 26.0–34.0)
MCHC: 32.8 g/dL (ref 30.0–36.0)
MCV: 95.3 fL (ref 80.0–100.0)
Platelets: 196 10*3/uL (ref 150–400)
RBC: 3.42 MIL/uL — ABNORMAL LOW (ref 4.22–5.81)
RDW: 19.2 % — ABNORMAL HIGH (ref 11.5–15.5)
WBC: 6.6 10*3/uL (ref 4.0–10.5)
nRBC: 0.8 % — ABNORMAL HIGH (ref 0.0–0.2)

## 2020-06-08 LAB — LACTIC ACID, PLASMA: Lactic Acid, Venous: 2.6 mmol/L (ref 0.5–1.9)

## 2020-06-08 LAB — PROTIME-INR
INR: 2.7 — ABNORMAL HIGH (ref 0.8–1.2)
Prothrombin Time: 27.9 seconds — ABNORMAL HIGH (ref 11.4–15.2)

## 2020-06-08 MED ORDER — MELATONIN 3 MG PO TABS
3.0000 mg | ORAL_TABLET | Freq: Every day | ORAL | Status: DC
  Administered 2020-06-08 (×2): 3 mg via ORAL
  Filled 2020-06-08 (×2): qty 1

## 2020-06-08 NOTE — Progress Notes (Signed)
Subjective:  Patient denies any chest pain or shortness of breath states overall feels better remains in A. fib flutter with moderate ventricular response.  Overall feels better.  Tolerated hemodialysis yesterday.  Objective:  Vital Signs in the last 24 hours: Temp:  [97.8 F (36.6 C)-98 F (36.7 C)] 97.8 F (36.6 C) (01/16 0948) Pulse Rate:  [93-100] 93 (01/16 0956) Resp:  [13-22] 18 (01/16 0948) BP: (86-102)/(59-80) 102/71 (01/16 0948) SpO2:  [100 %] 100 % (01/16 0948) Weight:  [109.7 kg-110 kg] 110 kg (01/16 1025)  Intake/Output from previous day: 01/15 0701 - 01/16 0700 In: 1700 [P.O.:1200; IV Piggyback:500] Out: -  Intake/Output from this shift: No intake/output data recorded.  Physical Exam: Neck: no adenopathy, no carotid bruit, no JVD and supple, symmetrical, trachea midline Lungs: clear to auscultation bilaterally Heart: irregularly irregular rhythm, S1, S2 normal and 2/6 systolic murmur noted Abdomen: soft, non-tender; bowel sounds normal; no masses,  no organomegaly Extremities: extremities normal, atraumatic, no cyanosis or edema  Lab Results: Recent Labs    06/07/20 0357 06/08/20 0304  WBC 5.4 6.6  HGB 10.1* 10.7*  PLT 194 196   Recent Labs    06/06/20 1501 06/07/20 0357  NA 134* 135  K 4.0 4.3  CL 95* 95*  CO2 23 25  GLUCOSE 107* 87  BUN 45* 23*  CREATININE 10.32* 7.15*   No results for input(s): TROPONINI in the last 72 hours.  Invalid input(s): CK, MB Hepatic Function Panel Recent Labs    06/06/20 1501  ALBUMIN 3.1*   No results for input(s): CHOL in the last 72 hours. No results for input(s): PROTIME in the last 72 hours.  Imaging: Imaging results have been reviewed and No results found.  Cardiac Studies:  Assessment/Plan:  Atrial fibrillation/flutter with  moderate ventricular response Nonischemic dilated cardiomyopathy Acute on chronic systolic left heart failure ESRD on hemodialysis Anemia of chronic disease Obesity Type 2  DM Dehydration, stable Plan Continue present management Will reduce amiodarone dose in few days as outpatient Follow-up with Dr. Doylene Canard in 1 week if discharged  LOS: 4 days    Charolette Forward 06/08/2020, 11:58 AM

## 2020-06-08 NOTE — Progress Notes (Signed)
[place] for warfarin Indication: atrial fibrillation  Allergies  Allergen Reactions  . Ace Inhibitors Itching, Cough and Other (See Comments)    Patient Measurements: Height: 6\' 1"  (185.4 cm) Weight: 109.9 kg (242 lb 4.6 oz) IBW/kg (Calculated) : 79.9  Vital Signs: Temp: 98 F (36.7 C) (01/16 0531) Temp Source: Oral (01/16 0531) BP: 94/65 (01/16 0100) Pulse Rate: 97 (01/16 0531)  Labs: Recent Labs    06/06/20 0553 06/06/20 1500 06/06/20 1501 06/07/20 0357 06/08/20 0304  HGB 10.2* 9.3*  --  10.1* 10.7*  HCT 32.7* 28.3*  --  32.5* 32.6*  PLT 184 181  --  194 196  LABPROT 26.9*  --   --  27.0* 27.9*  INR 2.6*  --   --  2.6* 2.7*  CREATININE 9.61*  --  10.32* 7.15*  --     Estimated Creatinine Clearance: 14.8 mL/min (A) (by C-G formula based on SCr of 7.15 mg/dL (H)).   Medical History: Past Medical History:  Diagnosis Date  . Acute lower GI bleeding 06/21/2018  . Acute respiratory failure with hypoxia ([place]) 01/13/2015  . Atrial flutter ([place])   . Bile leak, postoperative 03/15/2016  . Biliary dyskinesia 02/12/2016  . 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 Cardiomyopathy   . CHF (congestive heart failure) ([place])   . Coronary artery disease    normal coronaries by 10/10/08 cath, Cardiac Cath 08-04-12 epic.Dr. Doylene Canard follows  . Diabetes mellitus    pt. states he's borderline diabetic., no longer taking med,- off med. since 2013  . Dialysis patient [facility])    Mon-Wed-Fri([place])- Left AV fistula  . Diverticulitis November 2016   reoccurred in December 2016  . Diverticulitis of intestine without perforation or abscess without bleeding 04/21/2015  . DVT (deep venous thrombosis) ([place])   . ESRD (end stage renal disease) ([place])   . GERD (gastroesophageal reflux disease)   . Gout   . Hemorrhage of left kidney 01/13/2018  . History of nephrectomy 07/04/2012   History of right nephrectomy in 2002 for  renal cell carcinoma   . History of unilateral nephrectomy   . Hypertension   . Low iron   . Myocardial infarction [facility]) ?2006  . Renal cell carcinoma    dialysis- MWF- [address]- Dr. Mercy Moore follows.  . Renal insufficiency   . Shortness of breath 05/19/11   "at rest, lying down, w/exertion"  . Stroke [facility]) 02/2011   05/19/11 denies residual  . Umbilical hernia 93/81/82   unrepaired  . Wears glasses     Medications: Warfarin 7.5 mg PO daily PTA Last dose: 06/03/20 PTA  Assessment: Pt is a 77 yoM presenting with hypotension and sepsis.  Pharmacy consulted for warfarin dosing, which patient was on PTA for atrial fibrillation. Most of the INRs in EHR are subtherapeutic. Warfarin PTA dose 7.5mg  daily.  INR up slightly to 2.7. CBC is stable and diarrhea is improving. Since INR continues to trend up will hold the dose today. Consider a small dose of warfarin tomorrow so INR does not become subtherapeutic.    Goal of Therapy:  INR goal 1.8-2.2 (bleeds easily with INR >2.5) Monitor platelets by anticoagulation protocol: Yes   Plan:  Hold warfarin tonight  Monitor daily INR, CBC/plt Monitor for signs/symptoms of bleeding  F/u metronidazole duration and interaction    Mercy Riding, PharmD PGY1 Acute Care Pharmacy Resident Please refer to [facility] for unit-specific pharmacist

## 2020-06-08 NOTE — Progress Notes (Signed)
Pt does not c/o any pain but did have an episode of emesis. Zofran was provided Pt's appetite and oral intake is fair. Pt's needs are met and safety measures are maintained. Pt will have HD tomorrow

## 2020-06-08 NOTE — Progress Notes (Signed)
[facility] Progress Note   Subjective:  Seen in room, BP's better, diarrhea improving.   Objective Vitals:   06/08/20 0858 06/08/20 0948 06/08/20 0956 06/08/20 1025  BP:  102/71    Pulse: 94 94 93   Resp: 20 18    Temp:  97.8 F (36.6 C)    TempSrc:  Oral    SpO2:  100%    Weight:    110 kg  Height:       Physical Exam General:WDWN male in NAD Heart:+tachycardia, regular rhythm Lungs: mostly CTAB, breath sounds diminished Abdomen:soft, NTND Extremities:no LE edema Dialysis Access: LU AVF +b/t   Filed Weights   06/07/20 1240 06/08/20 0100 06/08/20 1025  Weight: 109.7 kg 109.9 kg 110 kg    Intake/Output Summary (Last 24 hours) at 06/08/2020 1606 Last data filed at 06/08/2020 1300 Gross per 24 hour  Intake 1220 ml  Output --  Net 1220 ml    Additional Objective Labs: Basic Metabolic Panel: Recent Labs  Lab 06/06/20 0553 06/06/20 1501 06/07/20 0357  NA 133* 134* 135  K 3.9 4.0 4.3  CL 94* 95* 95*  CO2 23 23 25   GLUCOSE 92 107* 87  BUN 38* 45* 23*  CREATININE 9.61* 10.32* 7.15*  CALCIUM 9.0 9.1 8.6*  PHOS  --  6.6*  --    Liver Function Tests: Recent Labs  Lab 06/05/20 0114 06/06/20 1501  AST 10*  --   ALT 9  --   ALKPHOS 104  --   BILITOT 0.8  --   PROT 7.9  --   ALBUMIN 3.3* 3.1*   CBC: Recent Labs  Lab 06/05/20 0613 06/06/20 0553 06/06/20 1500 06/07/20 0357 06/08/20 0304  WBC 6.4 5.8 6.2 5.4 6.6  HGB 10.3* 10.2* 9.3* 10.1* 10.7*  HCT 33.1* 32.7* 28.3* 32.5* 32.6*  MCV 96.2 95.1 94.3 96.4 95.3  PLT 173 184 181 194 196   Medications: . albumin human     . amiodarone  200 mg Oral BID  . benzonatate  200 mg Oral TID  . calcium acetate  2,001 mg Oral TID WC  . Chlorhexidine Gluconate Cloth  6 each Topical Q0600  . cinacalcet  180 mg Oral Daily  . diclofenac Sodium  2 g Topical QID  . digoxin  0.125 mg Oral Q M,W,F  . feeding supplement (NEPRO CARB STEADY)  237 mL Oral Daily  . fluticasone furoate-vilanterol  1 puff  Inhalation BID  . guaiFENesin  600 mg Oral BID  . heparin sodium (porcine)  4,000 Units Intravenous Once  . melatonin  3 mg Oral QHS  . metoprolol succinate  25 mg Oral Daily  . midodrine  10 mg Oral TID WC  . pantoprazole  40 mg Oral BID  . sucroferric oxyhydroxide  1,000 mg Oral TID WC  . Warfarin - Pharmacist Dosing Inpatient   Does not apply q1600    OP HD:  [facility]   4h  450/800  107kg  2/2 bath P2  LUA AVF  Hep 4000 venofer 50mg  qwk Mircera  58mcg q2wks - last 06/02/20 Hectorol 25mcg IV qHD    Assessment/Plan: 1.  Hypotension - on midodrine chronically. ECHO w/EF 25-30%. TSH normal. wkup ongoing. Usual BP's per patient are in the low 100's.   2. A fib - using digoxin/amiodarone per cardio 3. Diarrhea - x2wks. C diff negative. ?viral gastroenteritis . Better.  4.  ESRD -  On HD MWF. Next HD tomorrow 5. Volume  - up  2- 3 kg , UF 2 L w/ HD tomorrow 6.  Anemia of CKD - Hgb 10.3 - ESA recently dosed.  7.  Secondary Hyperparathyroidism -  Ca in goal. Will check phos. Continue VDRA, binders 8.  Nutrition - Renal diet w/fluid restrictions. 9. DMT2 - per primary 10. Dispo - ok for dc from renal standpoint   Kelly Splinter, MD 06/08/2020, 4:06 PM

## 2020-06-09 LAB — CBC
HCT: 33.5 % — ABNORMAL LOW (ref 39.0–52.0)
Hemoglobin: 10.7 g/dL — ABNORMAL LOW (ref 13.0–17.0)
MCH: 30.2 pg (ref 26.0–34.0)
MCHC: 31.9 g/dL (ref 30.0–36.0)
MCV: 94.6 fL (ref 80.0–100.0)
Platelets: 190 10*3/uL (ref 150–400)
RBC: 3.54 MIL/uL — ABNORMAL LOW (ref 4.22–5.81)
RDW: 19.1 % — ABNORMAL HIGH (ref 11.5–15.5)
WBC: 6.4 10*3/uL (ref 4.0–10.5)
nRBC: 0.6 % — ABNORMAL HIGH (ref 0.0–0.2)

## 2020-06-09 LAB — CULTURE, BLOOD (ROUTINE X 2)
Culture: NO GROWTH
Culture: NO GROWTH
Special Requests: ADEQUATE

## 2020-06-09 LAB — PROTIME-INR
INR: 2.7 — ABNORMAL HIGH (ref 0.8–1.2)
Prothrombin Time: 27.9 seconds — ABNORMAL HIGH (ref 11.4–15.2)

## 2020-06-09 LAB — DIGOXIN LEVEL: Digoxin Level: 1 ng/mL (ref 0.8–2.0)

## 2020-06-09 MED ORDER — BENZONATATE 200 MG PO CAPS: 200.0000 mg | ORAL_CAPSULE | Freq: Three times a day (TID) | ORAL | 0 refills | Status: DC | PRN

## 2020-06-09 MED ORDER — HEPARIN SODIUM (PORCINE) 1000 UNIT/ML IJ SOLN
INTRAMUSCULAR | Status: AC
  Filled 2020-06-09: qty 4

## 2020-06-09 MED ORDER — MIDODRINE HCL 10 MG PO TABS: 10.0000 mg | ORAL_TABLET | Freq: Three times a day (TID) | ORAL | 3 refills | Status: AC

## 2020-06-09 MED ORDER — ONDANSETRON 4 MG PO TBDP: 4.0000 mg | ORAL_TABLET | Freq: Three times a day (TID) | ORAL | 0 refills | Status: DC | PRN

## 2020-06-09 MED ORDER — GUAIFENESIN ER 600 MG PO TB12: 600.0000 mg | ORAL_TABLET | Freq: Two times a day (BID) | ORAL | 0 refills | Status: DC

## 2020-06-09 MED ORDER — DIGOXIN 125 MCG PO TABS: 0.1250 mg | ORAL_TABLET | ORAL | 1 refills | Status: AC

## 2020-06-09 MED ORDER — AMIODARONE HCL 200 MG PO TABS: 200.0000 mg | ORAL_TABLET | Freq: Two times a day (BID) | ORAL | 1 refills | Status: DC

## 2020-06-09 MED ORDER — LOPERAMIDE HCL 2 MG PO TABS: 2.0000 mg | ORAL_TABLET | Freq: Four times a day (QID) | ORAL | 0 refills | Status: AC | PRN

## 2020-06-09 MED ORDER — METOPROLOL SUCCINATE ER 25 MG PO TB24: 25.0000 mg | ORAL_TABLET | Freq: Every day | ORAL | 3 refills | Status: DC

## 2020-06-09 NOTE — Discharge Instructions (Signed)

## 2020-06-09 NOTE — Discharge Summary (Addendum)
Physician Discharge Summary   Patient ID: [name] MRN: [id number] DOB/AGE: February 23, 1961 60 y.o.  Admit date: 06/04/2020 Discharge date: 06/09/2020  Primary Care Physician:  Glendale Chard, MD   Recommendations for Outpatient Follow-up:  1. Follow up with PCP in 1-2 weeks 2. Patient placed on amiodarone 200 mg twice daily for 7 days, plan to taper down at cardiology follow-up as outpatient  Home Health: None  Equipment/Devices:   Discharge Condition: stable  CODE STATUS: FULL  Diet recommendation: Heart healthy diet   Discharge Diagnoses:    Viral gastroenteritis, almost resolved . Severe sepsis, hypotension, hypovolemia, POA . Chronic systolic congestive heart failure ([facility]) . Atrial flutter with rapid ventricular response (H[facility]) ESRD on hemodialysis MWF Chronic systolic CHF, EF 35 to 29% Chronic pain syndrome  Consults:   Cardiology, Dr. Doylene Canard  Nephrology   Allergies:   Allergies  Allergen Reactions  . Ace Inhibitors Itching, Cough and Other (See Comments)     DISCHARGE MEDICATIONS: Allergies as of 06/09/2020      Reactions   Ace Inhibitors Itching, Cough, Other (See Comments)      Medication List    STOP taking these medications   allopurinol 100 MG tablet Commonly known as: ZYLOPRIM     TAKE these medications   albuterol 108 (90 Base) MCG/ACT inhaler Commonly known as: VENTOLIN HFA Inhale 2 puffs into the lungs every 6 (six) hours as needed for wheezing or shortness of breath.   amiodarone 200 MG tablet Commonly known as: PACERONE Take 1 tablet (200 mg total) by mouth 2 (two) times daily.   benzonatate 200 MG capsule Commonly known as: TESSALON Take 1 capsule (200 mg total) by mouth 3 (three) times daily as needed for cough.   Breo Ellipta 100-25 MCG/INH Aepb Generic drug: fluticasone furoate-vilanterol Inhale 1 puff into the lungs 2 (two) times daily. What changed:   when to take this  reasons to take this   calcium acetate 667 MG  capsule Commonly known as: PHOSLO Take 1-3 capsules (667-2,001 mg total) by mouth See admin instructions. Take 2001 mg capsules with meals three times daily and 667 mg capsule with snacks. What changed:   how much to take  when to take this  additional instructions   chlorhexidine 0.12 % solution Commonly known as: PERIDEX 15 mLs by Mouth Rinse route 2 (two) times daily.   cinacalcet 90 MG tablet Commonly known as: SENSIPAR Take 180 mg by mouth daily.   diclofenac Sodium 1 % Gel Commonly known as: VOLTAREN Apply 2 g topically 4 (four) times daily.   digoxin 0.125 MG tablet Commonly known as: LANOXIN Take 1 tablet (0.125 mg total) by mouth every Monday, Wednesday, and Friday.   feeding supplement (NEPRO CARB STEADY) Liqd Take 237 mLs by mouth daily.   ferric citrate 1 GM 210 MG(Fe) tablet Commonly known as: AURYXIA Take 630 mg by mouth 3 (three) times daily with meals.   gabapentin 100 MG capsule Commonly known as: Neurontin Take 1 capsule (100 mg total) by mouth 3 (three) times daily as needed (coughing uncontrollably).   guaiFENesin 600 MG 12 hr tablet Commonly known as: MUCINEX Take 1 tablet (600 mg total) by mouth 2 (two) times daily.   HYDROcodone-homatropine 5-1.5 MG/5ML syrup Commonly known as: Hydromet Take 5 mLs by mouth every 6 (six) hours as needed. What changed: reasons to take this   ipratropium 0.06 % nasal spray Commonly known as: ATROVENT Place 2 sprays into both nostrils 4 (four) times daily. What  changed:   when to take this  reasons to take this   lidocaine-prilocaine cream Commonly known as: EMLA Apply 1 application topically See admin instructions. Apply topically to port access prior to dialysis - Monday, Wednesday, Friday.   loperamide 2 MG tablet Commonly known as: IMODIUM A-D Take 1 tablet (2 mg total) by mouth 4 (four) times daily as needed for diarrhea or loose stools. Also available OTC.   metoprolol succinate 25 MG 24 hr  tablet Commonly known as: TOPROL-XL Take 1 tablet (25 mg total) by mouth daily.   midodrine 10 MG tablet Commonly known as: PROAMATINE Take 1 tablet (10 mg total) by mouth 3 (three) times daily.   multivitamin Tabs tablet Take 1 tablet by mouth at bedtime.   nitroGLYCERIN 0.4 MG SL tablet Commonly known as: NITROSTAT Place 0.4 mg under the tongue every 5 (five) minutes as needed for chest pain.   ondansetron 4 MG disintegrating tablet Commonly known as: Zofran ODT Take 1 tablet (4 mg total) by mouth every 8 (eight) hours as needed for nausea or vomiting.   oxyCODONE-acetaminophen 5-325 MG tablet Commonly known as: PERCOCET/ROXICET Take 1 tablet by mouth every 4 (four) hours as needed for severe pain.   pantoprazole 40 MG tablet Commonly known as: PROTONIX Take 40 mg by mouth 2 (two) times daily.   UNABLE TO FIND Out patient physical therapy- vestibular therapy for BPPV   Velphoro 500 MG chewable tablet Generic drug: sucroferric oxyhydroxide Chew 500-1,000 mg by mouth See admin instructions. Takes 2 tablets with each meal and 1 tablet with snacks.   warfarin 7.5 MG tablet Commonly known as: COUMADIN Take 7.5 mg by mouth daily.        Brief H and P: For complete details please refer to admission H and P, but in brief *[name] is a 60 y.o. male with PMH significant for ESRD-HD-MWF, DM 2, HTN, HLD, chronic systolic CHF, atrial fibrillation on warfarin, and iron deficiency anemia, chronic pain.  On 1/12, patient was at dialysis where he completed his treatment but was then noted to be tachycardic and hypotensive and sent to the emergency department for evaluation of this.  Patient reports a nonproductive cough for several months now despite treatment with antibiotics and steroids, reports testing negative for COVID recently, has received 3 doses of COVID-vaccine.  Also reportsnausea with nonbloody vomiting as well as diarrhea for roughly 2 weeks now. There is some mild  generalized abdominal discomfort associated with this that improves for a while after having a loose stool.   In the ED, patient was afebrile, tachycardic to 540G, and with systolic blood pressure was low ranging from 66-82.  EKG showed atrial flutter with rate 124.  Chest x-ray showed cardiomegaly with vascular congestion.  C diff negative, GI pathogen negative. COVID negative.   Hospital Course:   Severe sepsis with hypotension, POA -Presented with diarrhea, tachycardia, hypotension, lactic acidosis, on admission.  He reported.    ongoing diarrhea and vomiting for last 2 to 3 weeks.  Probably viral gastroenteritis causing severe dehydration leading to hypotension, lactic acidosis. He is also immunocompromised due to ESRD status.  Hence he was treated as severe sepsis on admission, placed on IV vancomycin and cefepime.  He was placed on IV fluid hydration. On 1/15, patient started improving, IV fluids were discontinued, antibiotics were de-escalated Cultures have remained negative, likely viral gastroenteritis, antibiotics discontinued Nausea vomiting has resolved, tolerating diet   Diarrhealikely due to viral gastroenteritis. -Patient reports 2 weeks  of loose stools with mild abd discomfort and some N/V -He has been on antibiotics recently for URI, C. difficile PCR, GI pathogen panel negative -Currently no fever chills or abdominal pain.  Continue Imodium as needed  Chronic cough -Ongoing dry cough for last 2 to 3 weeks -Chest x-ray on admission showed mild vascular congestion.  Continue Tessalon Perles, Mucinex   Chronic systolic CHF  Chronic hypotension -EF was 35-40% in 2020 -CHF appears compensated.  Last echo from July 2020 with EF 35 to 40%.  Obtain repeat echo. -Patient states that his blood pressure lately has been running low because of which his CHF meds were held.  He is also on midodrine.  I would continue midodrine 10 mg 3 times daily at this time.  Continue to  monitor blood pressure. Continue midodrine Currently having hemodialysis per his schedule, no acute issues   ESRD-HD-MWF -Continue binders, judicious with fluid, renally-dose medications Patient received hemodialysis and prior to discharge per his schedule,   Atrial fibrillation with RVR -Rate 120s on admission, secondary to sepsis/dehydration. -Cardiology was consulted, patient was placed on amiodarone drip and digoxin.  Heart rate has been now controlled. -Continue amiodarone 200 mg twice a day and outpatient taper at cardiology follow-up plan. Continue warfarin, INR therapeutic 2.7 at the time of discharge   Chronic pain -No pain complaints on admission   Day of Discharge S: Doing well, states the diarrhea has significantly improved, no nausea vomiting, abdominal pain.  Tolerating diet.  Wants to go home.  Currently on HD.  BP 107/80   Pulse (!) 58   Temp 98.1 F (36.7 C) (Oral)   Resp (!) 21   Ht 6\' 1"  (1.854 m)   Wt 110.9 kg   SpO2 96%   BMI 32.26 kg/m   Physical Exam: General: Alert and awake oriented x3 not in any acute distress. HEENT: anicteric sclera, pupils reactive to light and accommodation CVS: S1-S2 clear no murmur rubs or gallops Chest: clear to auscultation bilaterally, no wheezing rales or rhonchi Abdomen: soft nontender, nondistended, normal bowel sounds Extremities: no cyanosis, clubbing or edema noted bilaterally Neuro: Cranial nerves II-XII intact, no focal neurological deficits    Get Medicines reviewed and adjusted: Please take all your medications with you for your next visit with your Primary MD  Please request your Primary MD to go over all hospital tests and procedure/radiological results at the follow up. Please ask your Primary MD to get all Hospital records sent to his/her office.  If you experience worsening of your admission symptoms, develop shortness of breath, life threatening emergency, suicidal or homicidal thoughts you  must seek medical attention immediately by calling 911 or calling your MD immediately  if symptoms less severe.  You must read complete instructions/literature along with all the possible adverse reactions/side effects for all the Medicines you take and that have been prescribed to you. Take any new Medicines after you have completely understood and accept all the possible adverse reactions/side effects.   Do not drive when taking pain medications.   Do not take more than prescribed Pain, Sleep and Anxiety Medications  Special Instructions: If you have smoked or chewed Tobacco  in the last 2 yrs please stop smoking, stop any regular Alcohol  and or any Recreational drug use.  Wear Seat belts while driving.  Please note  You were cared for by a hospitalist during your hospital stay. Once you are discharged, your primary care physician will handle any further medical issues. Please  note that NO REFILLS for any discharge medications will be authorized once you are discharged, as it is imperative that you return to your primary care physician (or establish a relationship with a primary care physician if you do not have one) for your aftercare needs so that they can reassess your need for medications and monitor your lab values.   The results of significant diagnostics from this hospitalization (including imaging, microbiology, ancillary and laboratory) are listed below for reference.      Procedures/Studies:  DG Chest Port 1 View  Result Date: 06/04/2020 CLINICAL DATA:  59 year old male with cough. EXAM: PORTABLE CHEST 1 VIEW COMPARISON:  Chest radiograph dated 04/24/2020. FINDINGS: Minimal bibasilar atelectasis. No focal consolidation, pleural effusion or pneumothorax. There is mild cardiomegaly with mild vascular congestion. Atherosclerotic calcification of the aorta. No acute osseous pathology. IMPRESSION: Cardiomegaly with mild vascular congestion. No focal consolidation. Electronically  Signed   By: Anner Crete M.D.   On: 06/04/2020 18:44   ECHOCARDIOGRAM COMPLETE  Result Date: 06/06/2020    ECHOCARDIOGRAM REPORT   Patient Name:   PAT SIRES Vargo Date of Exam: 06/05/2020 Medical Rec #:  [id number]     Height:       73.0 in Accession #:    [id number]    Weight:       241.0 lb Date of Birth:  [birth year]     BSA:          2.329 m Patient Age:    67 years      BP:           96/66 mmHg Patient Gender: M             HR:           122 bpm. Exam Location:  Inpatient Procedure: 2D Echo, Cardiac Doppler and Color Doppler Indications:     Abnormal EKG  History:         Patient has prior history of Echocardiogram examinations, most                  recent 11/23/2018. CHF, CAD and Previous Myocardial Infarction,                  Arrythmias:Atrial Flutter; Risk Factors:Diabetes. ESRD, anemia.  Sonographer:     Dustin Flock RDCS Referring Phys:  [id number] [place] Diagnosing Phys: Dixie Dials MD IMPRESSIONS  1. Left ventricular ejection fraction, by estimation, is 25 to 30%. The left ventricle has severely decreased function. The left ventricle demonstrates global hypokinesis. The left ventricular internal cavity size was moderately dilated. There is mild concentric left ventricular hypertrophy. Left ventricular diastolic parameters are indeterminate.  2. Right ventricular systolic function is mildly reduced. The right ventricular size is normal. There is mildly elevated pulmonary artery systolic pressure.  3. Left atrial size was severely dilated.  4. Right atrial size was moderately dilated.  5. The mitral valve is degenerative. Severe mitral valve regurgitation. No evidence of mitral stenosis.  6. Tricuspid valve regurgitation is moderate.  7. The aortic valve is tricuspid. There is moderate calcification of the aortic valve. There is mild thickening of the aortic valve. Aortic valve regurgitation is mild. Mild to moderate aortic valve sclerosis/calcification is present, without any evidence of  aortic stenosis.  8. The inferior vena cava is dilated in size with <50% respiratory variability, suggesting right atrial pressure of 15 mmHg. FINDINGS  Left Ventricle: Left ventricular ejection fraction, by estimation, is 25 to 30%. The left ventricle has  severely decreased function. The left ventricle demonstrates global hypokinesis. The left ventricular internal cavity size was moderately dilated. There is mild concentric left ventricular hypertrophy. Left ventricular diastolic parameters are indeterminate. Right Ventricle: The right ventricular size is normal. No increase in right ventricular wall thickness. Right ventricular systolic function is mildly reduced. There is mildly elevated pulmonary artery systolic pressure. The tricuspid regurgitant velocity  is 3.00 m/s, and with an assumed right atrial pressure of 8 mmHg, the estimated right ventricular systolic pressure is 20.3 mmHg. Left Atrium: Left atrial size was severely dilated. Right Atrium: Right atrial size was moderately dilated. Pericardium: There is no evidence of pericardial effusion. Mitral Valve: The mitral valve is degenerative in appearance. There is mild thickening of the mitral valve leaflet(s). There is mild calcification of the mitral valve leaflet(s). Mild mitral annular calcification. Severe mitral valve regurgitation. No evidence of mitral valve stenosis. Tricuspid Valve: The tricuspid valve is normal in structure. Tricuspid valve regurgitation is moderate. Aortic Valve: The aortic valve is tricuspid. There is moderate calcification of the aortic valve. There is mild thickening of the aortic valve. There is mild aortic valve annular calcification. Aortic valve regurgitation is mild. Aortic regurgitation PHT  measures 318 msec. Mild to moderate aortic valve sclerosis/calcification is present, without any evidence of aortic stenosis. Pulmonic Valve: The pulmonic valve was normal in structure. Pulmonic valve regurgitation is not visualized.  Aorta: The aortic root is normal in size and structure. There is minimal (Grade I) atheroma plaque involving the aortic root and ascending aorta. Venous: The inferior vena cava is dilated in size with less than 50% respiratory variability, suggesting right atrial pressure of 15 mmHg. IAS/Shunts: The interatrial septum was not well visualized.  LEFT VENTRICLE PLAX 2D LVIDd:         7.00 cm      Diastology LVIDs:         6.10 cm      LV e' medial:    3.37 cm/s LV PW:         1.40 cm      LV E/e' medial:  28.8 LV IVS:        1.30 cm      LV e' lateral:   8.05 cm/s LVOT diam:     2.70 cm      LV E/e' lateral: 12.0 LV SV:         62 LV SV Index:   27 LVOT Area:     5.73 cm  LV Volumes (MOD) LV vol d, MOD A4C: 290.0 ml LV vol s, MOD A4C: 205.0 ml LV SV MOD A4C:     290.0 ml RIGHT VENTRICLE RV Basal diam:  3.70 cm RV S prime:     11.20 cm/s TAPSE (M-mode): 2.7 cm LEFT ATRIUM              Index       RIGHT ATRIUM           Index LA diam:        6.60 cm  2.83 cm/m  RA Area:     24.80 cm LA Vol (A2C):   122.0 ml 52.38 ml/m RA Volume:   82.80 ml  35.55 ml/m LA Vol (A4C):   155.0 ml 66.55 ml/m LA Biplane Vol: 144.0 ml 61.83 ml/m  AORTIC VALVE LVOT Vmax:   73.90 cm/s LVOT Vmean:  49.500 cm/s LVOT VTI:    0.109 m AI PHT:      318 msec  AORTA Ao  Root diam: 3.80 cm MITRAL VALVE               TRICUSPID VALVE MV Area (PHT): 5.97 cm    TR Peak grad:   36.0 mmHg MV Decel Time: 127 msec    TR Vmax:        300.00 cm/s MV E velocity: 97.00 cm/s MV A velocity: 43.20 cm/s  SHUNTS MV E/A ratio:  2.25        Systemic VTI:  0.11 m                            Systemic Diam: 2.70 cm Dixie Dials MD Electronically signed by Dixie Dials MD Signature Date/Time: 06/06/2020/9:45:24 AM    Final        LAB RESULTS: Basic Metabolic Panel: Recent Labs  Lab 06/04/20 1739 06/04/20 1741 06/06/20 1501 06/07/20 0357  NA  --    < > 134* 135  K  --    < > 4.0 4.3  CL  --    < > 95* 95*  CO2  --    < > 23 25  GLUCOSE  --    < > 107* 87   BUN  --    < > 45* 23*  CREATININE  --    < > 10.32* 7.15*  CALCIUM  --    < > 9.1 8.6*  MG 2.3  --   --   --   PHOS  --   --  6.6*  --    < > = values in this interval not displayed.   Liver Function Tests: Recent Labs  Lab 06/05/20 0114 06/06/20 1501  AST 10*  --   ALT 9  --   ALKPHOS 104  --   BILITOT 0.8  --   PROT 7.9  --   ALBUMIN 3.3* 3.1*   No results for input(s): LIPASE, AMYLASE in the last 168 hours. No results for input(s): AMMONIA in the last 168 hours. CBC: Recent Labs  Lab 06/08/20 0304 06/09/20 0255  WBC 6.6 6.4  HGB 10.7* 10.7*  HCT 32.6* 33.5*  MCV 95.3 94.6  PLT 196 190   Cardiac Enzymes: No results for input(s): CKTOTAL, CKMB, CKMBINDEX, TROPONINI in the last 168 hours. BNP: Invalid input(s): POCBNP CBG: No results for input(s): GLUCAP in the last 168 hours.     Disposition and Follow-up: Discharge Instructions    Diet - low sodium heart healthy   Complete by: As directed    Increase activity slowly   Complete by: As directed        DISPOSITION: Home after HD   [place]    Glendale Chard, MD. Schedule an appointment as soon as possible for a visit in 2 week(s).   Specialty: Internal Medicine Contact information: [address] [place] [address] [phone]        Dixie Dials, MD. Schedule an appointment as soon as possible for a visit in 1 week(s).   Specialty: Cardiology Contact information: [place] [address] [phone]                Time coordinating discharge:  35 minutes  Signed:   Estill Cotta M.D. [facility] 06/09/2020, 11:35 AM

## 2020-06-09 NOTE — Care Management Important Message (Signed)
Important Message  Patient Details  Name: [name] MRN: [id number] Date of Birth: 04-Apr-1961   Medicare Important Message Given:  Yes - Important Message mailed due to current National Emergency  Verbal consent obtained due to current National Emergency  Relationship to patient: Self Contact Name: Adebayo Ensminger Call Date: 06/09/20  Time: 1412 Phone: [phone] Outcome: No Answer/Busy Important Message mailed to: Patient address on file    Delorse Lek 06/09/2020, 2:12 PM

## 2020-06-09 NOTE — Progress Notes (Signed)
Subjective:  Starting his hd  tx , , no cos ,"diarrhea almost resolved, ready to Go?"  Objective Vital signs in last 24 hours: Vitals:   06/08/20 1817 06/08/20 2048 06/09/20 0020 06/09/20 0844  BP: 113/83 108/81 103/77 134/79  Pulse:  94 92   Resp:  18 17 18   Temp:   98.7 F (37.1 C) 98.1 F (36.7 C)  TempSrc:  Oral Oral Oral  SpO2:  98% 97% 97%  Weight:   110.1 kg 110.9 kg  Height:       Weight change: 0.3 kg  Physical Exam General:Alert ,WDWN male NAD Heart:= Ireg ireg +tachycardia 101 on telem,  Lungs:Anter. CTAB, nolabored  Abdomen: Bs + ,soft, NTND Extremities:no LE edema Dialysis Access: LU AVF + bruit   OP HD:  [facility]   4h  450/800  107kg  2/2 bath P2  LUA AVF  Hep 4000 venofer 50mg  qwk Mircera61mcg q2wks - last1/10/22 Hectorol97mcg IV qHD   Problem/Plan: 1. Hypotension- on midodrine chronically. ECHO w/EF 25-30%. TSH normal. wkup ongoing. Usual BP's per patient are in the low 100's.   2. A fib- using digoxin/amiodarone per cardio 3. Diarrhea- x2wks. C diff negative. ?viral gastroenteritis . "almost resolved".  4. ESRD- On HD MWF. on schedule  5. Volume- up 2- 3 kg need stand wt post  , UF 2 L w/ HD tomorrow 6. Anemiaof CKD- Hgb 10.7 - ESA recently dosed. 7. Secondary Hyperparathyroidism -Ca in goal.Phos 6.6, Continue VDRA, binders 8. Nutrition- Renal diet w/fluid restrictions. 9. DMT2- per primary 10. Dispo - ok for dc from renal standpoint  Ernest Haber, PA-C [facility] [phone] 06/09/2020,9:12 AM  LOS: 5 days   Labs: Basic Metabolic Panel: Recent Labs  Lab 06/06/20 0553 06/06/20 1501 06/07/20 0357  NA 133* 134* 135  K 3.9 4.0 4.3  CL 94* 95* 95*  CO2 23 23 25   GLUCOSE 92 107* 87  BUN 38* 45* 23*  CREATININE 9.61* 10.32* 7.15*  CALCIUM 9.0 9.1 8.6*  PHOS  --  6.6*  --    Liver Function Tests: Recent Labs  Lab 06/05/20 0114 06/06/20 1501  AST 10*  --   ALT 9  --   ALKPHOS 104  --    BILITOT 0.8  --   PROT 7.9  --   ALBUMIN 3.3* 3.1*   No results for input(s): LIPASE, AMYLASE in the last 168 hours. No results for input(s): AMMONIA in the last 168 hours. CBC: Recent Labs  Lab 06/06/20 0553 06/06/20 1500 06/07/20 0357 06/08/20 0304 06/09/20 0255  WBC 5.8 6.2 5.4 6.6 6.4  HGB 10.2* 9.3* 10.1* 10.7* 10.7*  HCT 32.7* 28.3* 32.5* 32.6* 33.5*  MCV 95.1 94.3 96.4 95.3 94.6  PLT 184 181 194 196 190   Cardiac Enzymes: No results for input(s): CKTOTAL, CKMB, CKMBINDEX, TROPONINI in the last 168 hours. CBG: No results for input(s): GLUCAP in the last 168 hours.  Studies/Results: No results found. Medications: . albumin human     . amiodarone  200 mg Oral BID  . benzonatate  200 mg Oral TID  . calcium acetate  2,001 mg Oral TID WC  . Chlorhexidine Gluconate Cloth  6 each Topical Q0600  . cinacalcet  180 mg Oral Daily  . diclofenac Sodium  2 g Topical QID  . digoxin  0.125 mg Oral Q M,W,F  . feeding supplement (NEPRO CARB STEADY)  237 mL Oral Daily  . fluticasone furoate-vilanterol  1 puff Inhalation BID  .  guaiFENesin  600 mg Oral BID  . heparin sodium (porcine)      . heparin sodium (porcine)  4,000 Units Intravenous Once  . melatonin  3 mg Oral QHS  . metoprolol succinate  25 mg Oral Daily  . midodrine  10 mg Oral TID WC  . pantoprazole  40 mg Oral BID  . sucroferric oxyhydroxide  1,000 mg Oral TID WC  . Warfarin - Pharmacist Dosing Inpatient   Does not apply [id number]

## 2020-06-09 NOTE — Plan of Care (Signed)
DISCHARGE NOTE HOME [name] to be discharged homeper MD order. Discussed prescriptions and follow up appointments with the patient. Medication list explained in detail. Patient verbalized understanding.  Skin clean, dry and intact without evidence of skin break down, no evidence of skin tears noted. IV catheter discontinued intact. Site without signs and symptoms of complications. Dressing and pressure applied. Pt denies pain at the site currently. No complaints noted.  Patient free of lines, drains, and wounds.   An After Visit Summary (AVS) was printed and given to the patient. Patient escorted via wheelchair, and discharged home via private auto.  [name], RN

## 2020-06-09 NOTE — Consult Note (Signed)
Ref: Matthew Chard, MD   Subjective:  Feeling better. HR coming down and decreasing shortness of breath. Able to tolerate PO feedings. Dig level is therapeutic. INR is near therapeutic and tolerating so far.  Objective:  Vital Signs in the last 24 hours: Temp:  [98 F (36.7 C)-98.7 F (37.1 C)] 98.1 F (36.7 C) (01/17 0844) Pulse Rate:  [58-95] 58 (01/17 1000) Cardiac Rhythm: Atrial fibrillation (01/17 0700) Resp:  [17-22] 21 (01/17 1000) BP: (87-148)/(72-128) 107/80 (01/17 1000) SpO2:  [95 %-99 %] 96 % (01/17 1000) Weight:  [110.1 kg-110.9 kg] 110.9 kg (01/17 0844)  Physical Exam: BP Readings from Last 1 Encounters:  06/09/20 107/80     Wt Readings from Last 1 Encounters:  06/09/20 110.9 kg    Weight change: 0.3 kg Body mass index is 32.26 kg/m. HEENT: [place]/AT, Eyes-Brown, Conjunctiva-Pink, Sclera-Non-icteric Neck: No JVD, No bruit, Trachea midline. Lungs:  Clear, Bilateral. Cardiac:  Irregular rhythm, normal S1 and S2, no S3. II/VI systolic murmur. Abdomen:  Soft, non-tender. BS present. Extremities:  No edema present. No cyanosis. No clubbing. LU AVF. CNS: AxOx3, Cranial nerves grossly intact, moves all 4 extremities.  Skin: Warm and dry.   Intake/Output from previous day: 01/16 0701 - 01/17 0700 In: 360 [P.O.:360] Out: 1 [Emesis/NG output:1]    Lab Results: BMET    Component Value Date/Time   NA 135 06/07/2020 0357   NA 134 (L) 06/06/2020 1501   NA 133 (L) 06/06/2020 0553   NA 138 11/15/2019 1012   K 4.3 06/07/2020 0357   K 4.0 06/06/2020 1501   K 3.9 06/06/2020 0553   CL 95 (L) 06/07/2020 0357   CL 95 (L) 06/06/2020 1501   CL 94 (L) 06/06/2020 0553   CO2 25 06/07/2020 0357   CO2 23 06/06/2020 1501   CO2 23 06/06/2020 0553   GLUCOSE 87 06/07/2020 0357   GLUCOSE 107 (H) 06/06/2020 1501   GLUCOSE 92 06/06/2020 0553   BUN 23 (H) 06/07/2020 0357   BUN 45 (H) 06/06/2020 1501   BUN 38 (H) 06/06/2020 0553   BUN 33 (H) 11/15/2019 1012   CREATININE 7.15  (H) 06/07/2020 0357   CREATININE 10.32 (H) 06/06/2020 1501   CREATININE 9.61 (H) 06/06/2020 0553   CALCIUM 8.6 (L) 06/07/2020 0357   CALCIUM 9.1 06/06/2020 1501   CALCIUM 9.0 06/06/2020 0553   GFRNONAA 8 (L) 06/07/2020 0357   GFRNONAA 5 (L) 06/06/2020 1501   GFRNONAA 6 (L) 06/06/2020 0553   GFRAA 6 (L) 11/15/2019 1012   GFRAA 5 (L) 01/19/2019 0631   GFRAA 6 (L) 01/18/2019 1300   CBC    Component Value Date/Time   WBC 6.4 06/09/2020 0255   RBC 3.54 (L) 06/09/2020 0255   HGB 10.7 (L) 06/09/2020 0255   HGB 12.0 (L) 11/09/2018 0936   HCT 33.5 (L) 06/09/2020 0255   HCT 35.0 (L) 11/09/2018 0936   PLT 190 06/09/2020 0255   PLT 237 11/09/2018 0936   MCV 94.6 06/09/2020 0255   MCV 91 11/09/2018 0936   MCH 30.2 06/09/2020 0255   MCHC 31.9 06/09/2020 0255   RDW 19.1 (H) 06/09/2020 0255   RDW 15.4 11/09/2018 0936   LYMPHSABS 0.7 04/24/2020 2320   MONOABS 0.9 04/24/2020 2320   EOSABS 0.0 04/24/2020 2320   BASOSABS 0.0 04/24/2020 2320   HEPATIC Function Panel Recent Labs    11/15/19 1012 04/24/20 2320 06/05/20 0114  PROT 7.3 8.0 7.9   HEMOGLOBIN A1C No components found for: HGA1C,  MPG  CARDIAC ENZYMES Lab Results  Component Value Date   CKTOTAL 136 03/16/2017   CKMB 4.3 (H) 05/20/2011   TROPONINI 0.20 (HH) 01/13/2018   TROPONINI 0.21 (HH) 01/13/2018   TROPONINI 0.27 (HH) 01/13/2018   BNP No results for input(s): PROBNP in the last 8760 hours. TSH Recent Labs    06/05/20 1111  TSH 1.591   CHOLESTEROL Recent Labs    11/15/19 1012 06/05/20 1111  CHOL 200* 115    Scheduled Meds: . amiodarone  200 mg Oral BID  . benzonatate  200 mg Oral TID  . calcium acetate  2,001 mg Oral TID WC  . Chlorhexidine Gluconate Cloth  6 each Topical Q0600  . cinacalcet  180 mg Oral Daily  . diclofenac Sodium  2 g Topical QID  . digoxin  0.125 mg Oral Q M,W,F  . feeding supplement (NEPRO CARB STEADY)  237 mL Oral Daily  . fluticasone furoate-vilanterol  1 puff Inhalation BID  .  guaiFENesin  600 mg Oral BID  . heparin sodium (porcine)      . heparin sodium (porcine)  4,000 Units Intravenous Once  . melatonin  3 mg Oral QHS  . metoprolol succinate  25 mg Oral Daily  . midodrine  10 mg Oral TID WC  . pantoprazole  40 mg Oral BID  . sucroferric oxyhydroxide  1,000 mg Oral TID WC  . Warfarin - Pharmacist Dosing Inpatient   Does not apply q1600   Continuous Infusions: . albumin human     PRN Meds:.acetaminophen **OR** acetaminophen, albuterol, calcium acetate, gabapentin, midodrine, ondansetron **OR** ondansetron (ZOFRAN) IV, sucroferric oxyhydroxide  Assessment/Plan: Atrial flutter/fib with controlled ventricular response Dilated cardiomyopathy Acute on chronic systolic left heart failure, HFrEF ESRD on hemodialysis Obesity Type 2 DM Dizziness, improved Dehydration, improved Long term anticoagulation use  Continue current treatment. F/U in 1 week.   LOS: 5 days   Time spent including chart review, lab review, examination, discussion with patient :  min   Dixie Dials  MD  06/09/2020, 11:10 AM

## 2020-06-10 ENCOUNTER — Telehealth: Payer: Self-pay

## 2020-06-10 ENCOUNTER — Telehealth (INDEPENDENT_AMBULATORY_CARE_PROVIDER_SITE_OTHER): Payer: Medicare Other | Admitting: Nurse Practitioner

## 2020-06-10 DIAGNOSIS — I4892 Unspecified atrial flutter: Secondary | ICD-10-CM

## 2020-06-10 DIAGNOSIS — A084 Viral intestinal infection, unspecified: Secondary | ICD-10-CM | POA: Diagnosis not present

## 2020-06-10 DIAGNOSIS — A09 Infectious gastroenteritis and colitis, unspecified: Secondary | ICD-10-CM | POA: Insufficient documentation

## 2020-06-10 DIAGNOSIS — I9589 Other hypotension: Secondary | ICD-10-CM | POA: Diagnosis not present

## 2020-06-10 DIAGNOSIS — E861 Hypovolemia: Secondary | ICD-10-CM | POA: Diagnosis not present

## 2020-06-10 NOTE — Telephone Encounter (Signed)
Transition Care Management Follow-up Telephone Call  Date of discharge and from where: [facility] 06/09/20  How have you been since you were released from the hospital? He has been feeling ok   Any questions or concerns? none  Items Reviewed:  Did the pt receive and understand the discharge instructions provided? yes  Medications obtained and verified? yes  Any new allergies since your discharge? no  Dietary orders reviewed? none  Do you have support at home? yes  Other (ie: DME, Home Health, etc) none  Functional Questionnaire: (I = Independent and D = Dependent)  Bathing/Dressing- I   Meal Prep- I  Eating- I  Maintaining continence- I  Transferring/Ambulation-I  Managing Meds- I   Follow up appointments reviewed:    PCP Hospital f/u appt confirmed?  scheduled to see [name] DNP,FNP-BC 06/10/20 at 2:30pm  [facility] f/u appt confirmed? no  Are transportation arrangements needed?no  If their condition worsens, is the pt aware to call  their PCP or go to the ED? yes  Was the patient provided with contact information for the PCP's office or ED? yes  Was the pt encouraged to call back with questions or concerns? yes

## 2020-06-10 NOTE — Progress Notes (Signed)
Virtual Visit via MyChart    This visit type was conducted due to national recommendations for restrictions regarding the COVID-19 Pandemic (e.g. social distancing) in an effort to limit this patient's exposure and mitigate transmission in our community.  Due to his co-morbid illnesses, this patient is at least at moderate risk for complications without adequate follow up.  This format is felt to be most appropriate for this patient at this time.  All issues noted in this document were discussed and addressed.  A limited physical exam was performed with this format.    This visit type was conducted due to national recommendations for restrictions regarding the COVID-19 Pandemic (e.g. social distancing) in an effort to limit this patient's exposure and mitigate transmission in our community.  Patients identity confirmed using two different identifiers.  This format is felt to be most appropriate for this patient at this time.  All issues noted in this document were discussed and addressed.  No physical exam was performed (except for noted visual exam findings with Video Visits).    Date:  06/13/2020   ID:  [name], DOB 07/30/1960, MRN [id number]  Patient Location:  Home - spoke with [name]  Provider location:   Office    Chief Complaint:  hypotension  History of Present Illness:    [name] is a 60 y.o. male who presents via video conferencing for a telehealth visit today.    The patient does not have symptoms concerning for COVID-19 infection (fever, chills, cough, or new shortness of breath).   Telephone visit for hospital admission on 1/12 - 1/17, after he left dialysis his blood pressure dropped he was advised to go to the cardiologist and he was sent by the cardiologist to [facility] to get him started on meds, then transferred to [facility]  He was given IV fluids.  He had been having diarrhea for 2-3 weeks.  After dialysis he would go straight to the bathroom  with diarrhea. He had been having vomiting as well.  Since being home this is improving.  His stools are more solid.  He is not eating greens due to being on blood thinners.   He is on new medication called amniodarone, so far doing well. He is to follow up with cardiology in 2 weeks. His next dialysis treatment is tomorrow.      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 Doylene Canard follows   Diabetes mellitus    pt. states he's borderline diabetic., no longer taking med,- off med. since 2013   Dialysis patient [facility])    Mon-Wed-Fri([place])- Left AV fistula   Diverticulitis November 2016   reoccurred in December 2016   Diverticulitis of intestine without perforation or abscess without bleeding 04/21/2015   DVT (deep venous thrombosis) (HCC)    ESRD (end stage renal disease) ([place])    GERD (gastroesophageal reflux disease)    Gout    Hemorrhage of left kidney 01/13/2018   History of nephrectomy 07/04/2012   History of right nephrectomy in 2002  for renal cell carcinoma    History of unilateral nephrectomy    Hypertension    Low iron    Myocardial infarction [facility] ?2006   Renal cell carcinoma    dialysis- [facility]- [address]- Dr. Mercy  follows.   Renal insufficiency    Shortness of breath 05/19/11   "at rest, lying down, w/exertion"   Stroke [facility]) 02/2011   05/19/11 denies residual   Umbilical hernia 29/79/89   unrepaired   Wears glasses    Past Surgical History:  Procedure Laterality Date   ANAL FISTULOTOMY  08/15/2018   AV FISTULA PLACEMENT   03/29/2011   Procedure: ARTERIOVENOUS (AV) FISTULA CREATION;  Surgeon: Hinda Lenis, MD;  Location: [place];  Service: Vascular;  Laterality: Left;  LEFT RADIOCEPHALIC , Arteriovenous [id number])   CARDIAC CATHETERIZATION  05/21/11   CARDIAC CATHETERIZATION N/A 03/16/2016   Procedure: Left Heart Cath and Coronary Angiography;  Surgeon: Dixie Dials, MD;  Location: [place] CV LAB;  Service: Cardiovascular;  Laterality: N/A;   CHOLECYSTECTOMY N/A 02/12/2016   Procedure: LAPAROSCOPIC CHOLECYSTECTOMY WITH INTRAOPERATIVE CHOLANGIOGRAM;  Surgeon: Jackolyn Confer, MD;  Location: [place];  Service: General;  Laterality: N/A;   COLONOSCOPY WITH PROPOFOL N/A 02/01/2017   Procedure: COLONOSCOPY WITH PROPOFOL;  Surgeon: Juanita Craver, MD;  Location: [facility];  Service: Endoscopy;  Laterality: N/A;   dialysis cath placed     ESOPHAGOGASTRODUODENOSCOPY (EGD) WITH PROPOFOL N/A 12/02/2015   Procedure: ESOPHAGOGASTRODUODENOSCOPY (EGD) WITH PROPOFOL;  Surgeon: Juanita Craver, MD;  [facility];  Service: Endoscopy;  Laterality: N/A;   EVALUATION UNDER ANESTHESIA WITH HEMORRHOIDECTOMY N/A 08/15/2018   Procedure: HEMORRHOID LIGATION/PEXY;  Surgeon: Michael Boston, MD;  Location: [place];  Service: General;  Laterality: N/A;   FINGER SURGERY     L pinkie finger- ORIF- /w remaining hardware - 1990's     FISTULOTOMY N/A 08/15/2018   Procedure: SUPERFICIAL ANAL FISTULOTOMY;  Surgeon: Michael Boston, MD;  Location: [place];  Service: General;  Laterality: N/A;   HEMORRHOID SURGERY     HERNIA REPAIR N/A 4/81/8563   Umbilical hernia repair   INSERTION OF DIALYSIS CATHETER N/A 01/27/2016   Procedure: INSERTION OF DIALYSIS CATHETER;  Surgeon: Waynetta Sandy, MD;  Location: [place];  Service: Vascular;  Laterality: N/A;   INSERTION OF MESH N/A 07/04/2012   Procedure: INSERTION OF MESH;  Surgeon: Madilyn Hook, DO;  Location: [place];  Service: General;  Laterality: N/A;   IR EMBO ART  VEN HEMORR  LYMPH EXTRAV  INC GUIDE ROADMAPPING  01/13/2018   IR FLUORO GUIDE CV LINE RIGHT  01/13/2018   IR IVC FILTER PLMT / S&I /IMG GUID/MOD SED  01/27/2018   IR RENAL SELECTIVE  UNI INC S&I MOD SED  01/13/2018   IR US GUIDE VASC ACCESS RIGHT  01/13/2018   KIDNEY CYST REMOVAL  2019   LAPAROSCOPIC LYSIS OF ADHESIONS N/A 02/12/2016   Procedure: LAPAROSCOPIC LYSIS OF ADHESIONS;  Surgeon: Jackolyn Confer, MD;  Location: [place];  Service: General;  Laterality: N/A;   LEFT AND RIGHT HEART CATHETERIZATION WITH CORONARY ANGIOGRAM N/A 08/04/2012   Procedure: LEFT AND RIGHT HEART CATHETERIZATION WITH CORONARY ANGIOGRAM;  Surgeon: Birdie Riddle, MD;  Location: [place] CATH LAB;  Service: Cardiovascular;  Laterality: N/A;   NEPHRECTOMY  2000   right   REVISON OF ARTERIOVENOUS FISTULA Left 06/05/2013   Procedure: REVISON OF LEFT RADIOCEPHALIC  ARTERIOVENOUS FISTULA;  Surgeon: Conrad Kelayres, MD;  Location: [place];  Service: Vascular;  Laterality: Left;  REVISON OF ARTERIOVENOUS FISTULA Left 01/27/2016   Procedure: REVISION OF LEFT UPPER EXTREMITY ARTERIOVENOUS FISTULA;  Surgeon: Waynetta Sandy, MD;  Location: [place];  Service: Vascular;  Laterality: Left;   REVISON OF ARTERIOVENOUS FISTULA Left 08/03/2016   Procedure: REVISON OF Left arm ARTERIOVENOUS FISTULA;  Surgeon: Elam Dutch, MD;  Location: [place];  Service: Vascular;  Laterality: Left;   REVISON OF ARTERIOVENOUS FISTULA Left 05/06/2017   Procedure: REVISION PLICATION OF ARTERIOVENOUS FISTULA LEFT ARM;  Surgeon: Rosetta Posner, MD;  Location: [place];  Service: Vascular;  Laterality: Left;   REVISON OF ARTERIOVENOUS FISTULA Left 02/01/2019   Procedure: REVISION OF ARTERIOVENOUS FISTULA LEFT ARM;  Surgeon: [name], MD;  Location: [place];  Service: Vascular;  Laterality: Left;   REVISON OF ARTERIOVENOUS FISTULA Left 03/15/2019   Procedure: REVISION PLICATION OF ARTERIOVENOUS FISTULA LEFT ARM;  Surgeon: [name], MD;  Location: [place];  Service: Vascular;  Laterality: Left;   RIGHT HEART CATHETERIZATION N/A 05/21/2011   Procedure: RIGHT HEART CATH;  Surgeon: Birdie Riddle, MD;  Location: [place] CATH LAB;  Service: Cardiovascular;  Laterality: N/A;   smashed  1990's   "left pinky; have a plate in there"   UMBILICAL HERNIA REPAIR N/A 07/04/2012   Procedure: LAPAROSCOPIC UMBILICAL HERNIA;  Surgeon: Madilyn Hook, DO;  Location: [place];  Service: General;  Laterality: N/A;   US ECHOCARDIOGRAPHY  05/20/11     No outpatient medications have been marked as taking for the 06/10/20 encounter (Telemedicine) with [name], FNP.     Allergies:   Ace inhibitors   Social History   Tobacco Use   Smoking status: Never Smoker   Smokeless tobacco: Never Used  Vaping Use   Vaping Use: Never used  Substance Use Topics   Alcohol use: No    Alcohol/week: 0.0 standard drinks   Drug use: No    Types: Cocaine    Comment: Past hx-none in 20 yrs     Family Hx: The patient's family history includes Hypertension in his mother; Kidney disease in his mother.  ROS:   Please see the history of present illness.    Review of Systems  Constitutional: Negative.   Respiratory: Negative.   Cardiovascular: Negative.   Genitourinary:       He is having concerns about his sex drive  Neurological: Negative.   Psychiatric/Behavioral: Negative.     All other systems reviewed and are negative.   Labs/Other Tests and Data Reviewed:    Recent Labs: 04/25/2020: B Natriuretic Peptide 2,042.9 06/04/2020: Magnesium 2.3 06/05/2020: ALT 9; TSH 1.591 06/07/2020: BUN 23; Creatinine, Ser 7.15; Potassium 4.3; Sodium 135 06/09/2020: Hemoglobin 10.7; Platelets 190   Recent Lipid Panel Lab Results  Component Value Date/Time   CHOL 115 06/05/2020 11:11 AM   CHOL 200 (H) 11/15/2019 10:12 AM   TRIG 105 06/05/2020 11:11 AM   HDL 42 06/05/2020 11:11 AM   HDL 53 11/15/2019 10:12 AM   CHOLHDL 2.7 06/05/2020 11:11 AM   LDLCALC 52 06/05/2020  11:11 AM   LDLCALC 112 (H) 11/15/2019 10:12 AM    Wt Readings from Last 3 Encounters:  06/09/20 239 lb 3.2 oz (108.5 kg)  04/25/20 238 lb 1.6 oz (108 kg)  02/06/20 251 lb 9.6 oz (114.1 kg)     Exam:    Vital Signs:  There were no vitals taken for this visit.    Physical Exam Constitutional:      General: He is not in  acute distress. Pulmonary:     Effort: Pulmonary effort is normal. No respiratory distress.  Neurological:     Mental Status: He is alert and oriented to person, place, and time.  Psychiatric:        Mood and Affect: Mood normal.        Thought Content: Thought content normal.        Judgment: Judgment normal.     ASSESSMENT & PLAN:    1. Viral gastroenteritis  This is resolving may have caused his hypotension due to the diarrhea TCM Performed. A member of the clinical team spoke with the patient upon dischare. Discharge summary was reviewed in full detail during the visit. Meds reconciled and compared to discharge meds. Medication list is updated and reviewed with the patient.  Greater than 50% face to face time was spent in counseling an coordination of care.  All questions were answered to the satisfaction of the patient.    2. Hypotension due to hypovolemia He was given IV fluids and is feeling better  3. Atrial flutter with rapid ventricular response ([place]) While in the hospital he had new atrial fibrillation, he is to follow up with his Cardiologist He is already on coumadin   COVID-19 Education: The signs and symptoms of COVID-19 were discussed with the patient and how to seek care for testing (follow up with PCP or arrange E-visit).  The importance of social distancing was discussed today.  Patient Risk:   After full review of this patients clinical status, I feel that they are at least moderate risk at this time.  Time:   Today, I have spent 11 minutes/ seconds with the patient with telehealth (telephone) technology discussing above diagnoses.      Medication Adjustments/Labs and Tests Ordered: Current medicines are reviewed at length with the patient today.  Concerns regarding medicines are outlined above.   Tests Ordered: No orders of the defined types were placed in this encounter.   Medication Changes: No orders of the defined types were placed in this encounter.   Disposition:  Follow up prn  Signed, [name], FNP

## 2020-06-11 DIAGNOSIS — E1129 Type 2 diabetes mellitus with other diabetic kidney complication: Secondary | ICD-10-CM | POA: Diagnosis not present

## 2020-06-11 DIAGNOSIS — D689 Coagulation defect, unspecified: Secondary | ICD-10-CM | POA: Diagnosis not present

## 2020-06-11 DIAGNOSIS — D509 Iron deficiency anemia, unspecified: Secondary | ICD-10-CM | POA: Diagnosis not present

## 2020-06-11 DIAGNOSIS — Z992 Dependence on renal dialysis: Secondary | ICD-10-CM | POA: Diagnosis not present

## 2020-06-11 DIAGNOSIS — D631 Anemia in chronic kidney disease: Secondary | ICD-10-CM | POA: Diagnosis not present

## 2020-06-11 DIAGNOSIS — N2581 Secondary hyperparathyroidism of renal origin: Secondary | ICD-10-CM | POA: Diagnosis not present

## 2020-06-11 DIAGNOSIS — N186 End stage renal disease: Secondary | ICD-10-CM | POA: Diagnosis not present

## 2020-06-12 ENCOUNTER — Encounter: Payer: Self-pay | Admitting: Nurse Practitioner

## 2020-06-13 ENCOUNTER — Encounter: Payer: Self-pay | Admitting: Nurse Practitioner

## 2020-06-13 DIAGNOSIS — E1129 Type 2 diabetes mellitus with other diabetic kidney complication: Secondary | ICD-10-CM | POA: Diagnosis not present

## 2020-06-13 DIAGNOSIS — N2581 Secondary hyperparathyroidism of renal origin: Secondary | ICD-10-CM | POA: Diagnosis not present

## 2020-06-13 DIAGNOSIS — D509 Iron deficiency anemia, unspecified: Secondary | ICD-10-CM | POA: Diagnosis not present

## 2020-06-13 DIAGNOSIS — N186 End stage renal disease: Secondary | ICD-10-CM | POA: Diagnosis not present

## 2020-06-13 DIAGNOSIS — D631 Anemia in chronic kidney disease: Secondary | ICD-10-CM | POA: Diagnosis not present

## 2020-06-13 DIAGNOSIS — Z992 Dependence on renal dialysis: Secondary | ICD-10-CM | POA: Diagnosis not present

## 2020-06-16 DIAGNOSIS — D631 Anemia in chronic kidney disease: Secondary | ICD-10-CM | POA: Diagnosis not present

## 2020-06-16 DIAGNOSIS — D509 Iron deficiency anemia, unspecified: Secondary | ICD-10-CM | POA: Diagnosis not present

## 2020-06-16 DIAGNOSIS — N186 End stage renal disease: Secondary | ICD-10-CM | POA: Diagnosis not present

## 2020-06-16 DIAGNOSIS — N2581 Secondary hyperparathyroidism of renal origin: Secondary | ICD-10-CM | POA: Diagnosis not present

## 2020-06-16 DIAGNOSIS — Z992 Dependence on renal dialysis: Secondary | ICD-10-CM | POA: Diagnosis not present

## 2020-06-16 DIAGNOSIS — E1129 Type 2 diabetes mellitus with other diabetic kidney complication: Secondary | ICD-10-CM | POA: Diagnosis not present

## 2020-06-18 DIAGNOSIS — D509 Iron deficiency anemia, unspecified: Secondary | ICD-10-CM | POA: Diagnosis not present

## 2020-06-18 DIAGNOSIS — E1129 Type 2 diabetes mellitus with other diabetic kidney complication: Secondary | ICD-10-CM | POA: Diagnosis not present

## 2020-06-18 DIAGNOSIS — D689 Coagulation defect, unspecified: Secondary | ICD-10-CM | POA: Diagnosis not present

## 2020-06-18 DIAGNOSIS — D631 Anemia in chronic kidney disease: Secondary | ICD-10-CM | POA: Diagnosis not present

## 2020-06-18 DIAGNOSIS — N186 End stage renal disease: Secondary | ICD-10-CM | POA: Diagnosis not present

## 2020-06-18 DIAGNOSIS — Z992 Dependence on renal dialysis: Secondary | ICD-10-CM | POA: Diagnosis not present

## 2020-06-18 DIAGNOSIS — N2581 Secondary hyperparathyroidism of renal origin: Secondary | ICD-10-CM | POA: Diagnosis not present

## 2020-06-20 DIAGNOSIS — D631 Anemia in chronic kidney disease: Secondary | ICD-10-CM | POA: Diagnosis not present

## 2020-06-20 DIAGNOSIS — N2581 Secondary hyperparathyroidism of renal origin: Secondary | ICD-10-CM | POA: Diagnosis not present

## 2020-06-20 DIAGNOSIS — E1129 Type 2 diabetes mellitus with other diabetic kidney complication: Secondary | ICD-10-CM | POA: Diagnosis not present

## 2020-06-20 DIAGNOSIS — N186 End stage renal disease: Secondary | ICD-10-CM | POA: Diagnosis not present

## 2020-06-20 DIAGNOSIS — Z992 Dependence on renal dialysis: Secondary | ICD-10-CM | POA: Diagnosis not present

## 2020-06-20 DIAGNOSIS — D509 Iron deficiency anemia, unspecified: Secondary | ICD-10-CM | POA: Diagnosis not present

## 2020-06-23 DIAGNOSIS — N2581 Secondary hyperparathyroidism of renal origin: Secondary | ICD-10-CM | POA: Diagnosis not present

## 2020-06-23 DIAGNOSIS — N186 End stage renal disease: Secondary | ICD-10-CM | POA: Diagnosis not present

## 2020-06-23 DIAGNOSIS — E1129 Type 2 diabetes mellitus with other diabetic kidney complication: Secondary | ICD-10-CM | POA: Diagnosis not present

## 2020-06-23 DIAGNOSIS — Z992 Dependence on renal dialysis: Secondary | ICD-10-CM | POA: Diagnosis not present

## 2020-06-23 DIAGNOSIS — D509 Iron deficiency anemia, unspecified: Secondary | ICD-10-CM | POA: Diagnosis not present

## 2020-06-23 DIAGNOSIS — D631 Anemia in chronic kidney disease: Secondary | ICD-10-CM | POA: Diagnosis not present

## 2020-06-24 DIAGNOSIS — I120 Hypertensive chronic kidney disease with stage 5 chronic kidney disease or end stage renal disease: Secondary | ICD-10-CM | POA: Diagnosis not present

## 2020-06-24 DIAGNOSIS — Z992 Dependence on renal dialysis: Secondary | ICD-10-CM | POA: Diagnosis not present

## 2020-06-24 DIAGNOSIS — N186 End stage renal disease: Secondary | ICD-10-CM | POA: Diagnosis not present

## 2020-06-25 DIAGNOSIS — D631 Anemia in chronic kidney disease: Secondary | ICD-10-CM | POA: Diagnosis not present

## 2020-06-25 DIAGNOSIS — N186 End stage renal disease: Secondary | ICD-10-CM | POA: Diagnosis not present

## 2020-06-25 DIAGNOSIS — Z992 Dependence on renal dialysis: Secondary | ICD-10-CM | POA: Diagnosis not present

## 2020-06-25 DIAGNOSIS — I4892 Unspecified atrial flutter: Secondary | ICD-10-CM | POA: Diagnosis not present

## 2020-06-25 DIAGNOSIS — N2581 Secondary hyperparathyroidism of renal origin: Secondary | ICD-10-CM | POA: Diagnosis not present

## 2020-06-25 LAB — POCT INR: INR: 1.4 — AB (ref <=1.1)

## 2020-06-27 DIAGNOSIS — Z992 Dependence on renal dialysis: Secondary | ICD-10-CM | POA: Diagnosis not present

## 2020-06-27 DIAGNOSIS — N2581 Secondary hyperparathyroidism of renal origin: Secondary | ICD-10-CM | POA: Diagnosis not present

## 2020-06-27 DIAGNOSIS — D631 Anemia in chronic kidney disease: Secondary | ICD-10-CM | POA: Diagnosis not present

## 2020-06-27 DIAGNOSIS — N186 End stage renal disease: Secondary | ICD-10-CM | POA: Diagnosis not present

## 2020-06-30 ENCOUNTER — Encounter: Payer: Self-pay | Admitting: Nurse Practitioner

## 2020-06-30 DIAGNOSIS — N186 End stage renal disease: Secondary | ICD-10-CM | POA: Diagnosis not present

## 2020-06-30 DIAGNOSIS — Z992 Dependence on renal dialysis: Secondary | ICD-10-CM | POA: Diagnosis not present

## 2020-06-30 DIAGNOSIS — D631 Anemia in chronic kidney disease: Secondary | ICD-10-CM | POA: Diagnosis not present

## 2020-06-30 DIAGNOSIS — N2581 Secondary hyperparathyroidism of renal origin: Secondary | ICD-10-CM | POA: Diagnosis not present

## 2020-07-01 ENCOUNTER — Ambulatory Visit: Payer: Medicare Other | Admitting: Nurse Practitioner

## 2020-07-02 DIAGNOSIS — N186 End stage renal disease: Secondary | ICD-10-CM | POA: Diagnosis not present

## 2020-07-02 DIAGNOSIS — D631 Anemia in chronic kidney disease: Secondary | ICD-10-CM | POA: Diagnosis not present

## 2020-07-02 DIAGNOSIS — Z992 Dependence on renal dialysis: Secondary | ICD-10-CM | POA: Diagnosis not present

## 2020-07-02 DIAGNOSIS — N2581 Secondary hyperparathyroidism of renal origin: Secondary | ICD-10-CM | POA: Diagnosis not present

## 2020-07-04 DIAGNOSIS — N186 End stage renal disease: Secondary | ICD-10-CM | POA: Diagnosis not present

## 2020-07-04 DIAGNOSIS — N2581 Secondary hyperparathyroidism of renal origin: Secondary | ICD-10-CM | POA: Diagnosis not present

## 2020-07-04 DIAGNOSIS — Z992 Dependence on renal dialysis: Secondary | ICD-10-CM | POA: Diagnosis not present

## 2020-07-04 DIAGNOSIS — D631 Anemia in chronic kidney disease: Secondary | ICD-10-CM | POA: Diagnosis not present

## 2020-07-07 DIAGNOSIS — D689 Coagulation defect, unspecified: Secondary | ICD-10-CM | POA: Diagnosis not present

## 2020-07-07 DIAGNOSIS — D631 Anemia in chronic kidney disease: Secondary | ICD-10-CM | POA: Diagnosis not present

## 2020-07-07 DIAGNOSIS — N2581 Secondary hyperparathyroidism of renal origin: Secondary | ICD-10-CM | POA: Diagnosis not present

## 2020-07-07 DIAGNOSIS — Z992 Dependence on renal dialysis: Secondary | ICD-10-CM | POA: Diagnosis not present

## 2020-07-07 DIAGNOSIS — N186 End stage renal disease: Secondary | ICD-10-CM | POA: Diagnosis not present

## 2020-07-07 LAB — POCT INR: INR: 1.9 — AB (ref <=1.1)

## 2020-07-08 ENCOUNTER — Encounter: Payer: Self-pay | Admitting: Nurse Practitioner

## 2020-07-08 DIAGNOSIS — I5023 Acute on chronic systolic (congestive) heart failure: Secondary | ICD-10-CM | POA: Diagnosis not present

## 2020-07-08 DIAGNOSIS — I2510 Atherosclerotic heart disease of native coronary artery without angina pectoris: Secondary | ICD-10-CM | POA: Diagnosis not present

## 2020-07-08 DIAGNOSIS — N186 End stage renal disease: Secondary | ICD-10-CM | POA: Diagnosis not present

## 2020-07-08 DIAGNOSIS — E860 Dehydration: Secondary | ICD-10-CM | POA: Diagnosis not present

## 2020-07-09 DIAGNOSIS — D689 Coagulation defect, unspecified: Secondary | ICD-10-CM | POA: Diagnosis not present

## 2020-07-09 DIAGNOSIS — Z992 Dependence on renal dialysis: Secondary | ICD-10-CM | POA: Diagnosis not present

## 2020-07-09 DIAGNOSIS — N186 End stage renal disease: Secondary | ICD-10-CM | POA: Diagnosis not present

## 2020-07-09 DIAGNOSIS — N2581 Secondary hyperparathyroidism of renal origin: Secondary | ICD-10-CM | POA: Diagnosis not present

## 2020-07-09 DIAGNOSIS — D631 Anemia in chronic kidney disease: Secondary | ICD-10-CM | POA: Diagnosis not present

## 2020-07-09 LAB — POCT INR: INR: 2.1 — AB (ref <=1.1)

## 2020-07-10 DIAGNOSIS — I2510 Atherosclerotic heart disease of native coronary artery without angina pectoris: Secondary | ICD-10-CM | POA: Diagnosis not present

## 2020-07-10 DIAGNOSIS — N186 End stage renal disease: Secondary | ICD-10-CM | POA: Diagnosis not present

## 2020-07-10 DIAGNOSIS — E860 Dehydration: Secondary | ICD-10-CM | POA: Diagnosis not present

## 2020-07-10 DIAGNOSIS — I5023 Acute on chronic systolic (congestive) heart failure: Secondary | ICD-10-CM | POA: Diagnosis not present

## 2020-07-11 DIAGNOSIS — D631 Anemia in chronic kidney disease: Secondary | ICD-10-CM | POA: Diagnosis not present

## 2020-07-11 DIAGNOSIS — N2581 Secondary hyperparathyroidism of renal origin: Secondary | ICD-10-CM | POA: Diagnosis not present

## 2020-07-11 DIAGNOSIS — N186 End stage renal disease: Secondary | ICD-10-CM | POA: Diagnosis not present

## 2020-07-11 DIAGNOSIS — Z992 Dependence on renal dialysis: Secondary | ICD-10-CM | POA: Diagnosis not present

## 2020-07-14 ENCOUNTER — Encounter: Payer: Self-pay | Admitting: Nurse Practitioner

## 2020-07-14 DIAGNOSIS — D631 Anemia in chronic kidney disease: Secondary | ICD-10-CM | POA: Diagnosis not present

## 2020-07-14 DIAGNOSIS — N186 End stage renal disease: Secondary | ICD-10-CM | POA: Diagnosis not present

## 2020-07-14 DIAGNOSIS — Z992 Dependence on renal dialysis: Secondary | ICD-10-CM | POA: Diagnosis not present

## 2020-07-14 DIAGNOSIS — N2581 Secondary hyperparathyroidism of renal origin: Secondary | ICD-10-CM | POA: Diagnosis not present

## 2020-07-16 DIAGNOSIS — Z992 Dependence on renal dialysis: Secondary | ICD-10-CM | POA: Diagnosis not present

## 2020-07-16 DIAGNOSIS — D689 Coagulation defect, unspecified: Secondary | ICD-10-CM | POA: Diagnosis not present

## 2020-07-16 DIAGNOSIS — N186 End stage renal disease: Secondary | ICD-10-CM | POA: Diagnosis not present

## 2020-07-16 DIAGNOSIS — D631 Anemia in chronic kidney disease: Secondary | ICD-10-CM | POA: Diagnosis not present

## 2020-07-16 DIAGNOSIS — N2581 Secondary hyperparathyroidism of renal origin: Secondary | ICD-10-CM | POA: Diagnosis not present

## 2020-07-18 DIAGNOSIS — Z992 Dependence on renal dialysis: Secondary | ICD-10-CM | POA: Diagnosis not present

## 2020-07-18 DIAGNOSIS — N2581 Secondary hyperparathyroidism of renal origin: Secondary | ICD-10-CM | POA: Diagnosis not present

## 2020-07-18 DIAGNOSIS — N186 End stage renal disease: Secondary | ICD-10-CM | POA: Diagnosis not present

## 2020-07-18 DIAGNOSIS — D631 Anemia in chronic kidney disease: Secondary | ICD-10-CM | POA: Diagnosis not present

## 2020-07-21 DIAGNOSIS — Z992 Dependence on renal dialysis: Secondary | ICD-10-CM | POA: Diagnosis not present

## 2020-07-21 DIAGNOSIS — D631 Anemia in chronic kidney disease: Secondary | ICD-10-CM | POA: Diagnosis not present

## 2020-07-21 DIAGNOSIS — N186 End stage renal disease: Secondary | ICD-10-CM | POA: Diagnosis not present

## 2020-07-21 DIAGNOSIS — N2581 Secondary hyperparathyroidism of renal origin: Secondary | ICD-10-CM | POA: Diagnosis not present

## 2020-07-22 DIAGNOSIS — N186 End stage renal disease: Secondary | ICD-10-CM | POA: Diagnosis not present

## 2020-07-22 DIAGNOSIS — I120 Hypertensive chronic kidney disease with stage 5 chronic kidney disease or end stage renal disease: Secondary | ICD-10-CM | POA: Diagnosis not present

## 2020-07-22 DIAGNOSIS — Z992 Dependence on renal dialysis: Secondary | ICD-10-CM | POA: Diagnosis not present

## 2020-07-23 DIAGNOSIS — E1129 Type 2 diabetes mellitus with other diabetic kidney complication: Secondary | ICD-10-CM | POA: Diagnosis not present

## 2020-07-23 DIAGNOSIS — N2581 Secondary hyperparathyroidism of renal origin: Secondary | ICD-10-CM | POA: Diagnosis not present

## 2020-07-23 DIAGNOSIS — D509 Iron deficiency anemia, unspecified: Secondary | ICD-10-CM | POA: Diagnosis not present

## 2020-07-23 DIAGNOSIS — N186 End stage renal disease: Secondary | ICD-10-CM | POA: Diagnosis not present

## 2020-07-23 DIAGNOSIS — D631 Anemia in chronic kidney disease: Secondary | ICD-10-CM | POA: Diagnosis not present

## 2020-07-23 DIAGNOSIS — Z992 Dependence on renal dialysis: Secondary | ICD-10-CM | POA: Diagnosis not present

## 2020-07-23 DIAGNOSIS — I4892 Unspecified atrial flutter: Secondary | ICD-10-CM | POA: Diagnosis not present

## 2020-07-23 LAB — POCT INR: INR: 3 — AB (ref <=1.1)

## 2020-07-24 ENCOUNTER — Encounter: Payer: Self-pay | Admitting: Nurse Practitioner

## 2020-07-25 ENCOUNTER — Other Ambulatory Visit: Payer: Self-pay | Admitting: Nurse Practitioner

## 2020-07-25 DIAGNOSIS — N2581 Secondary hyperparathyroidism of renal origin: Secondary | ICD-10-CM | POA: Diagnosis not present

## 2020-07-25 DIAGNOSIS — D631 Anemia in chronic kidney disease: Secondary | ICD-10-CM | POA: Diagnosis not present

## 2020-07-25 DIAGNOSIS — R053 Chronic cough: Secondary | ICD-10-CM

## 2020-07-25 DIAGNOSIS — N186 End stage renal disease: Secondary | ICD-10-CM | POA: Diagnosis not present

## 2020-07-25 DIAGNOSIS — Z992 Dependence on renal dialysis: Secondary | ICD-10-CM | POA: Diagnosis not present

## 2020-07-25 DIAGNOSIS — D509 Iron deficiency anemia, unspecified: Secondary | ICD-10-CM | POA: Diagnosis not present

## 2020-07-28 DIAGNOSIS — N186 End stage renal disease: Secondary | ICD-10-CM | POA: Diagnosis not present

## 2020-07-28 DIAGNOSIS — N2581 Secondary hyperparathyroidism of renal origin: Secondary | ICD-10-CM | POA: Diagnosis not present

## 2020-07-28 DIAGNOSIS — D509 Iron deficiency anemia, unspecified: Secondary | ICD-10-CM | POA: Diagnosis not present

## 2020-07-28 DIAGNOSIS — D631 Anemia in chronic kidney disease: Secondary | ICD-10-CM | POA: Diagnosis not present

## 2020-07-28 DIAGNOSIS — Z992 Dependence on renal dialysis: Secondary | ICD-10-CM | POA: Diagnosis not present

## 2020-07-30 DIAGNOSIS — Z992 Dependence on renal dialysis: Secondary | ICD-10-CM | POA: Diagnosis not present

## 2020-07-30 DIAGNOSIS — N2581 Secondary hyperparathyroidism of renal origin: Secondary | ICD-10-CM | POA: Diagnosis not present

## 2020-07-30 DIAGNOSIS — N186 End stage renal disease: Secondary | ICD-10-CM | POA: Diagnosis not present

## 2020-07-30 DIAGNOSIS — D631 Anemia in chronic kidney disease: Secondary | ICD-10-CM | POA: Diagnosis not present

## 2020-07-30 DIAGNOSIS — D509 Iron deficiency anemia, unspecified: Secondary | ICD-10-CM | POA: Diagnosis not present

## 2020-07-30 DIAGNOSIS — D689 Coagulation defect, unspecified: Secondary | ICD-10-CM | POA: Diagnosis not present

## 2020-07-30 LAB — POCT INR: INR: 1.3 — AB (ref <=1.1)

## 2020-08-01 DIAGNOSIS — Z992 Dependence on renal dialysis: Secondary | ICD-10-CM | POA: Diagnosis not present

## 2020-08-01 DIAGNOSIS — D509 Iron deficiency anemia, unspecified: Secondary | ICD-10-CM | POA: Diagnosis not present

## 2020-08-01 DIAGNOSIS — N186 End stage renal disease: Secondary | ICD-10-CM | POA: Diagnosis not present

## 2020-08-01 DIAGNOSIS — D631 Anemia in chronic kidney disease: Secondary | ICD-10-CM | POA: Diagnosis not present

## 2020-08-01 DIAGNOSIS — N2581 Secondary hyperparathyroidism of renal origin: Secondary | ICD-10-CM | POA: Diagnosis not present

## 2020-08-04 DIAGNOSIS — Z992 Dependence on renal dialysis: Secondary | ICD-10-CM | POA: Diagnosis not present

## 2020-08-04 DIAGNOSIS — D631 Anemia in chronic kidney disease: Secondary | ICD-10-CM | POA: Diagnosis not present

## 2020-08-04 DIAGNOSIS — N2581 Secondary hyperparathyroidism of renal origin: Secondary | ICD-10-CM | POA: Diagnosis not present

## 2020-08-04 DIAGNOSIS — N186 End stage renal disease: Secondary | ICD-10-CM | POA: Diagnosis not present

## 2020-08-04 DIAGNOSIS — D509 Iron deficiency anemia, unspecified: Secondary | ICD-10-CM | POA: Diagnosis not present

## 2020-08-06 DIAGNOSIS — N186 End stage renal disease: Secondary | ICD-10-CM | POA: Diagnosis not present

## 2020-08-06 DIAGNOSIS — N2581 Secondary hyperparathyroidism of renal origin: Secondary | ICD-10-CM | POA: Diagnosis not present

## 2020-08-06 DIAGNOSIS — D631 Anemia in chronic kidney disease: Secondary | ICD-10-CM | POA: Diagnosis not present

## 2020-08-06 DIAGNOSIS — D509 Iron deficiency anemia, unspecified: Secondary | ICD-10-CM | POA: Diagnosis not present

## 2020-08-06 DIAGNOSIS — D689 Coagulation defect, unspecified: Secondary | ICD-10-CM | POA: Diagnosis not present

## 2020-08-06 DIAGNOSIS — Z992 Dependence on renal dialysis: Secondary | ICD-10-CM | POA: Diagnosis not present

## 2020-08-06 LAB — POCT INR: INR: 2.3 — AB (ref <=1.1)

## 2020-08-07 ENCOUNTER — Encounter: Payer: Self-pay | Admitting: Nurse Practitioner

## 2020-08-07 DIAGNOSIS — I5023 Acute on chronic systolic (congestive) heart failure: Secondary | ICD-10-CM | POA: Diagnosis not present

## 2020-08-07 DIAGNOSIS — I2510 Atherosclerotic heart disease of native coronary artery without angina pectoris: Secondary | ICD-10-CM | POA: Diagnosis not present

## 2020-08-07 DIAGNOSIS — N186 End stage renal disease: Secondary | ICD-10-CM | POA: Diagnosis not present

## 2020-08-07 DIAGNOSIS — Z7901 Long term (current) use of anticoagulants: Secondary | ICD-10-CM | POA: Diagnosis not present

## 2020-08-08 ENCOUNTER — Encounter: Payer: Self-pay | Admitting: Nurse Practitioner

## 2020-08-08 DIAGNOSIS — Z992 Dependence on renal dialysis: Secondary | ICD-10-CM | POA: Diagnosis not present

## 2020-08-08 DIAGNOSIS — D509 Iron deficiency anemia, unspecified: Secondary | ICD-10-CM | POA: Diagnosis not present

## 2020-08-08 DIAGNOSIS — N2581 Secondary hyperparathyroidism of renal origin: Secondary | ICD-10-CM | POA: Diagnosis not present

## 2020-08-08 DIAGNOSIS — D631 Anemia in chronic kidney disease: Secondary | ICD-10-CM | POA: Diagnosis not present

## 2020-08-08 DIAGNOSIS — N186 End stage renal disease: Secondary | ICD-10-CM | POA: Diagnosis not present

## 2020-08-11 DIAGNOSIS — N186 End stage renal disease: Secondary | ICD-10-CM | POA: Diagnosis not present

## 2020-08-11 DIAGNOSIS — N2581 Secondary hyperparathyroidism of renal origin: Secondary | ICD-10-CM | POA: Diagnosis not present

## 2020-08-11 DIAGNOSIS — Z992 Dependence on renal dialysis: Secondary | ICD-10-CM | POA: Diagnosis not present

## 2020-08-11 DIAGNOSIS — D509 Iron deficiency anemia, unspecified: Secondary | ICD-10-CM | POA: Diagnosis not present

## 2020-08-11 DIAGNOSIS — D631 Anemia in chronic kidney disease: Secondary | ICD-10-CM | POA: Diagnosis not present

## 2020-08-13 DIAGNOSIS — D631 Anemia in chronic kidney disease: Secondary | ICD-10-CM | POA: Diagnosis not present

## 2020-08-13 DIAGNOSIS — Z992 Dependence on renal dialysis: Secondary | ICD-10-CM | POA: Diagnosis not present

## 2020-08-13 DIAGNOSIS — D689 Coagulation defect, unspecified: Secondary | ICD-10-CM | POA: Diagnosis not present

## 2020-08-13 DIAGNOSIS — N2581 Secondary hyperparathyroidism of renal origin: Secondary | ICD-10-CM | POA: Diagnosis not present

## 2020-08-13 DIAGNOSIS — D509 Iron deficiency anemia, unspecified: Secondary | ICD-10-CM | POA: Diagnosis not present

## 2020-08-13 DIAGNOSIS — N186 End stage renal disease: Secondary | ICD-10-CM | POA: Diagnosis not present

## 2020-08-13 LAB — POCT INR: INR: 2 — AB (ref 0.9–1.1)

## 2020-08-13 LAB — COMPREHENSIVE METABOLIC PANEL: Calcium: 10.3 (ref 8.7–10.7)

## 2020-08-13 LAB — CBC AND DIFFERENTIAL: Hemoglobin: 10.9 — AB (ref 13.5–17.5)

## 2020-08-13 LAB — PROTIME-INR: Protime: 21.9 — AB (ref 10.0–13.8)

## 2020-08-15 DIAGNOSIS — D631 Anemia in chronic kidney disease: Secondary | ICD-10-CM | POA: Diagnosis not present

## 2020-08-15 DIAGNOSIS — Z992 Dependence on renal dialysis: Secondary | ICD-10-CM | POA: Diagnosis not present

## 2020-08-15 DIAGNOSIS — N186 End stage renal disease: Secondary | ICD-10-CM | POA: Diagnosis not present

## 2020-08-15 DIAGNOSIS — D509 Iron deficiency anemia, unspecified: Secondary | ICD-10-CM | POA: Diagnosis not present

## 2020-08-15 DIAGNOSIS — N2581 Secondary hyperparathyroidism of renal origin: Secondary | ICD-10-CM | POA: Diagnosis not present

## 2020-08-18 DIAGNOSIS — Z992 Dependence on renal dialysis: Secondary | ICD-10-CM | POA: Diagnosis not present

## 2020-08-18 DIAGNOSIS — N186 End stage renal disease: Secondary | ICD-10-CM | POA: Diagnosis not present

## 2020-08-18 DIAGNOSIS — D509 Iron deficiency anemia, unspecified: Secondary | ICD-10-CM | POA: Diagnosis not present

## 2020-08-18 DIAGNOSIS — N2581 Secondary hyperparathyroidism of renal origin: Secondary | ICD-10-CM | POA: Diagnosis not present

## 2020-08-18 DIAGNOSIS — D631 Anemia in chronic kidney disease: Secondary | ICD-10-CM | POA: Diagnosis not present

## 2020-08-19 ENCOUNTER — Encounter: Payer: Self-pay | Admitting: Internal Medicine

## 2020-08-20 DIAGNOSIS — D631 Anemia in chronic kidney disease: Secondary | ICD-10-CM | POA: Diagnosis not present

## 2020-08-20 DIAGNOSIS — N2581 Secondary hyperparathyroidism of renal origin: Secondary | ICD-10-CM | POA: Diagnosis not present

## 2020-08-20 DIAGNOSIS — N186 End stage renal disease: Secondary | ICD-10-CM | POA: Diagnosis not present

## 2020-08-20 DIAGNOSIS — Z992 Dependence on renal dialysis: Secondary | ICD-10-CM | POA: Diagnosis not present

## 2020-08-20 DIAGNOSIS — D689 Coagulation defect, unspecified: Secondary | ICD-10-CM | POA: Diagnosis not present

## 2020-08-20 DIAGNOSIS — D509 Iron deficiency anemia, unspecified: Secondary | ICD-10-CM | POA: Diagnosis not present

## 2020-08-20 LAB — PROTIME-INR: Protime: 23.4 — AB (ref 10.0–13.8)

## 2020-08-20 LAB — POCT INR: INR: 2.2 — AB (ref <=1.1)

## 2020-08-22 DIAGNOSIS — N2581 Secondary hyperparathyroidism of renal origin: Secondary | ICD-10-CM | POA: Diagnosis not present

## 2020-08-22 DIAGNOSIS — I120 Hypertensive chronic kidney disease with stage 5 chronic kidney disease or end stage renal disease: Secondary | ICD-10-CM | POA: Diagnosis not present

## 2020-08-22 DIAGNOSIS — D509 Iron deficiency anemia, unspecified: Secondary | ICD-10-CM | POA: Diagnosis not present

## 2020-08-22 DIAGNOSIS — Z992 Dependence on renal dialysis: Secondary | ICD-10-CM | POA: Diagnosis not present

## 2020-08-22 DIAGNOSIS — N186 End stage renal disease: Secondary | ICD-10-CM | POA: Diagnosis not present

## 2020-08-22 DIAGNOSIS — D631 Anemia in chronic kidney disease: Secondary | ICD-10-CM | POA: Diagnosis not present

## 2020-08-22 DIAGNOSIS — E213 Hyperparathyroidism, unspecified: Secondary | ICD-10-CM | POA: Diagnosis not present

## 2020-08-25 ENCOUNTER — Telehealth: Payer: Self-pay

## 2020-08-25 ENCOUNTER — Encounter: Payer: Self-pay | Admitting: Nurse Practitioner

## 2020-08-25 DIAGNOSIS — D631 Anemia in chronic kidney disease: Secondary | ICD-10-CM | POA: Diagnosis not present

## 2020-08-25 DIAGNOSIS — N186 End stage renal disease: Secondary | ICD-10-CM | POA: Diagnosis not present

## 2020-08-25 DIAGNOSIS — D509 Iron deficiency anemia, unspecified: Secondary | ICD-10-CM | POA: Diagnosis not present

## 2020-08-25 DIAGNOSIS — Z992 Dependence on renal dialysis: Secondary | ICD-10-CM | POA: Diagnosis not present

## 2020-08-25 DIAGNOSIS — E213 Hyperparathyroidism, unspecified: Secondary | ICD-10-CM | POA: Diagnosis not present

## 2020-08-25 DIAGNOSIS — N2581 Secondary hyperparathyroidism of renal origin: Secondary | ICD-10-CM | POA: Diagnosis not present

## 2020-08-27 DIAGNOSIS — I4892 Unspecified atrial flutter: Secondary | ICD-10-CM | POA: Diagnosis not present

## 2020-08-27 DIAGNOSIS — N186 End stage renal disease: Secondary | ICD-10-CM | POA: Diagnosis not present

## 2020-08-27 DIAGNOSIS — E213 Hyperparathyroidism, unspecified: Secondary | ICD-10-CM | POA: Diagnosis not present

## 2020-08-27 DIAGNOSIS — Z992 Dependence on renal dialysis: Secondary | ICD-10-CM | POA: Diagnosis not present

## 2020-08-27 DIAGNOSIS — E1129 Type 2 diabetes mellitus with other diabetic kidney complication: Secondary | ICD-10-CM | POA: Diagnosis not present

## 2020-08-27 DIAGNOSIS — N2581 Secondary hyperparathyroidism of renal origin: Secondary | ICD-10-CM | POA: Diagnosis not present

## 2020-08-27 DIAGNOSIS — D509 Iron deficiency anemia, unspecified: Secondary | ICD-10-CM | POA: Diagnosis not present

## 2020-08-27 DIAGNOSIS — D631 Anemia in chronic kidney disease: Secondary | ICD-10-CM | POA: Diagnosis not present

## 2020-08-27 LAB — POCT INR: INR: 2.4 — AB (ref 0.9–1.1)

## 2020-08-29 DIAGNOSIS — D509 Iron deficiency anemia, unspecified: Secondary | ICD-10-CM | POA: Diagnosis not present

## 2020-08-29 DIAGNOSIS — Z992 Dependence on renal dialysis: Secondary | ICD-10-CM | POA: Diagnosis not present

## 2020-08-29 DIAGNOSIS — E213 Hyperparathyroidism, unspecified: Secondary | ICD-10-CM | POA: Diagnosis not present

## 2020-08-29 DIAGNOSIS — N186 End stage renal disease: Secondary | ICD-10-CM | POA: Diagnosis not present

## 2020-08-29 DIAGNOSIS — N2581 Secondary hyperparathyroidism of renal origin: Secondary | ICD-10-CM | POA: Diagnosis not present

## 2020-08-29 DIAGNOSIS — D631 Anemia in chronic kidney disease: Secondary | ICD-10-CM | POA: Diagnosis not present

## 2020-09-01 ENCOUNTER — Encounter: Payer: Self-pay | Admitting: Nurse Practitioner

## 2020-09-01 DIAGNOSIS — N2581 Secondary hyperparathyroidism of renal origin: Secondary | ICD-10-CM | POA: Diagnosis not present

## 2020-09-01 DIAGNOSIS — D509 Iron deficiency anemia, unspecified: Secondary | ICD-10-CM | POA: Diagnosis not present

## 2020-09-01 DIAGNOSIS — Z992 Dependence on renal dialysis: Secondary | ICD-10-CM | POA: Diagnosis not present

## 2020-09-01 DIAGNOSIS — E213 Hyperparathyroidism, unspecified: Secondary | ICD-10-CM | POA: Diagnosis not present

## 2020-09-01 DIAGNOSIS — N186 End stage renal disease: Secondary | ICD-10-CM | POA: Diagnosis not present

## 2020-09-01 DIAGNOSIS — D631 Anemia in chronic kidney disease: Secondary | ICD-10-CM | POA: Diagnosis not present

## 2020-09-03 DIAGNOSIS — N186 End stage renal disease: Secondary | ICD-10-CM | POA: Diagnosis not present

## 2020-09-03 DIAGNOSIS — D631 Anemia in chronic kidney disease: Secondary | ICD-10-CM | POA: Diagnosis not present

## 2020-09-03 DIAGNOSIS — D509 Iron deficiency anemia, unspecified: Secondary | ICD-10-CM | POA: Diagnosis not present

## 2020-09-03 DIAGNOSIS — N2581 Secondary hyperparathyroidism of renal origin: Secondary | ICD-10-CM | POA: Diagnosis not present

## 2020-09-03 DIAGNOSIS — Z992 Dependence on renal dialysis: Secondary | ICD-10-CM | POA: Diagnosis not present

## 2020-09-03 DIAGNOSIS — D689 Coagulation defect, unspecified: Secondary | ICD-10-CM | POA: Diagnosis not present

## 2020-09-03 DIAGNOSIS — E213 Hyperparathyroidism, unspecified: Secondary | ICD-10-CM | POA: Diagnosis not present

## 2020-09-03 LAB — POCT INR: INR: 2.1 — AB (ref <=1.1)

## 2020-09-05 DIAGNOSIS — D631 Anemia in chronic kidney disease: Secondary | ICD-10-CM | POA: Diagnosis not present

## 2020-09-05 DIAGNOSIS — N2581 Secondary hyperparathyroidism of renal origin: Secondary | ICD-10-CM | POA: Diagnosis not present

## 2020-09-05 DIAGNOSIS — D509 Iron deficiency anemia, unspecified: Secondary | ICD-10-CM | POA: Diagnosis not present

## 2020-09-05 DIAGNOSIS — Z992 Dependence on renal dialysis: Secondary | ICD-10-CM | POA: Diagnosis not present

## 2020-09-05 DIAGNOSIS — N186 End stage renal disease: Secondary | ICD-10-CM | POA: Diagnosis not present

## 2020-09-05 DIAGNOSIS — E213 Hyperparathyroidism, unspecified: Secondary | ICD-10-CM | POA: Diagnosis not present

## 2020-09-08 ENCOUNTER — Encounter: Payer: Self-pay | Admitting: Nurse Practitioner

## 2020-09-08 DIAGNOSIS — D509 Iron deficiency anemia, unspecified: Secondary | ICD-10-CM | POA: Diagnosis not present

## 2020-09-08 DIAGNOSIS — Z992 Dependence on renal dialysis: Secondary | ICD-10-CM | POA: Diagnosis not present

## 2020-09-08 DIAGNOSIS — N186 End stage renal disease: Secondary | ICD-10-CM | POA: Diagnosis not present

## 2020-09-08 DIAGNOSIS — E213 Hyperparathyroidism, unspecified: Secondary | ICD-10-CM | POA: Diagnosis not present

## 2020-09-08 DIAGNOSIS — D631 Anemia in chronic kidney disease: Secondary | ICD-10-CM | POA: Diagnosis not present

## 2020-09-08 DIAGNOSIS — N2581 Secondary hyperparathyroidism of renal origin: Secondary | ICD-10-CM | POA: Diagnosis not present

## 2020-09-10 DIAGNOSIS — Z992 Dependence on renal dialysis: Secondary | ICD-10-CM | POA: Diagnosis not present

## 2020-09-10 DIAGNOSIS — D509 Iron deficiency anemia, unspecified: Secondary | ICD-10-CM | POA: Diagnosis not present

## 2020-09-10 DIAGNOSIS — N186 End stage renal disease: Secondary | ICD-10-CM | POA: Diagnosis not present

## 2020-09-10 DIAGNOSIS — E213 Hyperparathyroidism, unspecified: Secondary | ICD-10-CM | POA: Diagnosis not present

## 2020-09-10 DIAGNOSIS — D689 Coagulation defect, unspecified: Secondary | ICD-10-CM | POA: Diagnosis not present

## 2020-09-10 DIAGNOSIS — N2581 Secondary hyperparathyroidism of renal origin: Secondary | ICD-10-CM | POA: Diagnosis not present

## 2020-09-10 DIAGNOSIS — D631 Anemia in chronic kidney disease: Secondary | ICD-10-CM | POA: Diagnosis not present

## 2020-09-10 LAB — POCT INR: INR: 2.3 — AB (ref <=1.1)

## 2020-09-11 DIAGNOSIS — D509 Iron deficiency anemia, unspecified: Secondary | ICD-10-CM | POA: Diagnosis not present

## 2020-09-11 DIAGNOSIS — N186 End stage renal disease: Secondary | ICD-10-CM | POA: Diagnosis not present

## 2020-09-11 DIAGNOSIS — Z992 Dependence on renal dialysis: Secondary | ICD-10-CM | POA: Diagnosis not present

## 2020-09-11 DIAGNOSIS — D631 Anemia in chronic kidney disease: Secondary | ICD-10-CM | POA: Diagnosis not present

## 2020-09-11 DIAGNOSIS — N2581 Secondary hyperparathyroidism of renal origin: Secondary | ICD-10-CM | POA: Diagnosis not present

## 2020-09-11 DIAGNOSIS — E213 Hyperparathyroidism, unspecified: Secondary | ICD-10-CM | POA: Diagnosis not present

## 2020-09-15 ENCOUNTER — Encounter: Payer: Self-pay | Admitting: Nurse Practitioner

## 2020-09-15 DIAGNOSIS — D631 Anemia in chronic kidney disease: Secondary | ICD-10-CM | POA: Diagnosis not present

## 2020-09-15 DIAGNOSIS — Z992 Dependence on renal dialysis: Secondary | ICD-10-CM | POA: Diagnosis not present

## 2020-09-15 DIAGNOSIS — N2581 Secondary hyperparathyroidism of renal origin: Secondary | ICD-10-CM | POA: Diagnosis not present

## 2020-09-15 DIAGNOSIS — E213 Hyperparathyroidism, unspecified: Secondary | ICD-10-CM | POA: Diagnosis not present

## 2020-09-15 DIAGNOSIS — N186 End stage renal disease: Secondary | ICD-10-CM | POA: Diagnosis not present

## 2020-09-15 DIAGNOSIS — D509 Iron deficiency anemia, unspecified: Secondary | ICD-10-CM | POA: Diagnosis not present

## 2020-09-17 DIAGNOSIS — D631 Anemia in chronic kidney disease: Secondary | ICD-10-CM | POA: Diagnosis not present

## 2020-09-17 DIAGNOSIS — D689 Coagulation defect, unspecified: Secondary | ICD-10-CM | POA: Diagnosis not present

## 2020-09-17 DIAGNOSIS — N2581 Secondary hyperparathyroidism of renal origin: Secondary | ICD-10-CM | POA: Diagnosis not present

## 2020-09-17 DIAGNOSIS — E213 Hyperparathyroidism, unspecified: Secondary | ICD-10-CM | POA: Diagnosis not present

## 2020-09-17 DIAGNOSIS — N186 End stage renal disease: Secondary | ICD-10-CM | POA: Diagnosis not present

## 2020-09-17 DIAGNOSIS — D509 Iron deficiency anemia, unspecified: Secondary | ICD-10-CM | POA: Diagnosis not present

## 2020-09-17 DIAGNOSIS — Z992 Dependence on renal dialysis: Secondary | ICD-10-CM | POA: Diagnosis not present

## 2020-09-17 LAB — POCT INR: INR: 1.4 — AB (ref <=1.1)

## 2020-09-19 DIAGNOSIS — N186 End stage renal disease: Secondary | ICD-10-CM | POA: Diagnosis not present

## 2020-09-19 DIAGNOSIS — D509 Iron deficiency anemia, unspecified: Secondary | ICD-10-CM | POA: Diagnosis not present

## 2020-09-19 DIAGNOSIS — N2581 Secondary hyperparathyroidism of renal origin: Secondary | ICD-10-CM | POA: Diagnosis not present

## 2020-09-19 DIAGNOSIS — Z992 Dependence on renal dialysis: Secondary | ICD-10-CM | POA: Diagnosis not present

## 2020-09-19 DIAGNOSIS — E213 Hyperparathyroidism, unspecified: Secondary | ICD-10-CM | POA: Diagnosis not present

## 2020-09-19 DIAGNOSIS — D631 Anemia in chronic kidney disease: Secondary | ICD-10-CM | POA: Diagnosis not present

## 2020-09-21 DIAGNOSIS — Z992 Dependence on renal dialysis: Secondary | ICD-10-CM | POA: Diagnosis not present

## 2020-09-21 DIAGNOSIS — N186 End stage renal disease: Secondary | ICD-10-CM | POA: Diagnosis not present

## 2020-09-21 DIAGNOSIS — I120 Hypertensive chronic kidney disease with stage 5 chronic kidney disease or end stage renal disease: Secondary | ICD-10-CM | POA: Diagnosis not present

## 2020-09-22 DIAGNOSIS — E213 Hyperparathyroidism, unspecified: Secondary | ICD-10-CM | POA: Diagnosis not present

## 2020-09-22 DIAGNOSIS — Z992 Dependence on renal dialysis: Secondary | ICD-10-CM | POA: Diagnosis not present

## 2020-09-22 DIAGNOSIS — D631 Anemia in chronic kidney disease: Secondary | ICD-10-CM | POA: Diagnosis not present

## 2020-09-22 DIAGNOSIS — N186 End stage renal disease: Secondary | ICD-10-CM | POA: Diagnosis not present

## 2020-09-22 DIAGNOSIS — N2581 Secondary hyperparathyroidism of renal origin: Secondary | ICD-10-CM | POA: Diagnosis not present

## 2020-09-23 ENCOUNTER — Encounter: Payer: Self-pay | Admitting: Nurse Practitioner

## 2020-09-24 DIAGNOSIS — D631 Anemia in chronic kidney disease: Secondary | ICD-10-CM | POA: Diagnosis not present

## 2020-09-24 DIAGNOSIS — N186 End stage renal disease: Secondary | ICD-10-CM | POA: Diagnosis not present

## 2020-09-24 DIAGNOSIS — E213 Hyperparathyroidism, unspecified: Secondary | ICD-10-CM | POA: Diagnosis not present

## 2020-09-24 DIAGNOSIS — N2581 Secondary hyperparathyroidism of renal origin: Secondary | ICD-10-CM | POA: Diagnosis not present

## 2020-09-24 DIAGNOSIS — Z992 Dependence on renal dialysis: Secondary | ICD-10-CM | POA: Diagnosis not present

## 2020-09-24 DIAGNOSIS — I4892 Unspecified atrial flutter: Secondary | ICD-10-CM | POA: Diagnosis not present

## 2020-09-25 ENCOUNTER — Other Ambulatory Visit: Payer: Self-pay

## 2020-09-25 ENCOUNTER — Encounter: Payer: Self-pay | Admitting: Nurse Practitioner

## 2020-09-25 ENCOUNTER — Ambulatory Visit (INDEPENDENT_AMBULATORY_CARE_PROVIDER_SITE_OTHER): Payer: Medicare Other | Admitting: Nurse Practitioner

## 2020-09-25 VITALS — BP 112/68 | HR 104 | Temp 98.4°F | Ht 73.0 in | Wt 236.0 lb

## 2020-09-25 DIAGNOSIS — E1121 Type 2 diabetes mellitus with diabetic nephropathy: Secondary | ICD-10-CM | POA: Diagnosis not present

## 2020-09-25 DIAGNOSIS — E782 Mixed hyperlipidemia: Secondary | ICD-10-CM | POA: Diagnosis not present

## 2020-09-25 DIAGNOSIS — K602 Anal fissure, unspecified: Secondary | ICD-10-CM

## 2020-09-25 NOTE — Progress Notes (Signed)
I,Yamilka Roman [employer] as a Education administrator for [employer], FNP.,have documented all relevant documentation on the behalf of [name], FNP,as directed by  [name], FNP while in the presence of [name], [place]. This visit occurred during the SARS-CoV-2 public health emergency.  Safety protocols were in place, including screening questions prior to the visit, additional usage of staff PPE, and extensive cleaning of exam room while observing appropriate contact time as indicated for disinfecting solutions.  Subjective:     Patient ID: [name] , male    DOB: Feb 03, 1961 , 60 y.o.   MRN: [id number]   Chief Complaint  Patient presents with  . Sore    Patient stated he has a sore on his butt. He stated it looks like a paper cut    HPI  Patient presents today for a sore on his butt. He stated it looks like a paper cut. He stated the pain comes and goes. Has been present for one week. He has been using a barrier cream, has not changed. Just started using the barrier cream last night and before that was using neosporin. In the past he has had it but improved on its own. He has had diarrhea. After he comes from dialysis he has to have a bowel movement and will sometimes be diarrhea. He has been diagnosed with diverticulosis. Has seen a GI provider in the past.  He has been using a doughnut to sit on.     Past Medical History:  Diagnosis Date  . Acute lower GI bleeding 06/21/2018  . Acute respiratory failure with hypoxia ([place]) 01/13/2015  . Atrial flutter ([place])   . Bile leak, postoperative 03/15/2016  . Biliary dyskinesia 02/12/2016  . 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 Cardiomyopathy   . CHF (congestive heart failure) ([place])   . Coronary artery disease    normal coronaries by 10/10/08 cath, Cardiac Cath 08-04-12 epic.Dr. Doylene Canard follows  . Diabetes mellitus    pt. states he's borderline diabetic., no longer taking med,- off med. since 2013  . Dialysis patient  [facility])    Mon-Wed-Fri([place])- Left AV fistula  . Diverticulitis November 2016   reoccurred in December 2016  . Diverticulitis of intestine without perforation or abscess without bleeding 04/21/2015  . DVT (deep venous thrombosis) ([place])   . ESRD (end stage renal disease) ([place])   . GERD (gastroesophageal reflux disease)   . Gout   . Hemorrhage of left kidney 01/13/2018  . History of nephrectomy 07/04/2012   History of right nephrectomy in 2002 for renal cell carcinoma   . History of unilateral nephrectomy   . Hypertension   . Low iron   . Myocardial infarction [facility]) ?2006  . Renal cell carcinoma    dialysis- MWF- [address]- Dr. Mercy Siobahn Worsley follows.  . Renal insufficiency   . Shortness of breath 05/19/11   "at rest, lying down, w/exertion"  . Stroke [facility]) 02/2011   05/19/11 denies residual  . Umbilical hernia 05/39/76   unrepaired  . Wears glasses      Family History  Problem Relation Age of Onset  . Hypertension Mother   . Kidney disease Mother      Current Outpatient Medications:  .  albuterol (VENTOLIN HFA) 108 (90 Base) MCG/ACT inhaler, Inhale 2 puffs into the lungs every 6 (six) hours as needed for wheezing or shortness of breath., Disp: 18 g, Rfl: 2 .  amiodarone (PACERONE) 200 MG tablet,  Take 1 tablet (200 mg total) by mouth 2 (two) times daily., Disp: 60 tablet, Rfl: 1 .  calcium acetate (PHOSLO) 667 MG capsule, Take 1-3 capsules (667-2,001 mg total) by mouth See admin instructions. Take 2001 mg capsules with meals three times daily and 667 mg capsule with snacks. (Patient taking differently: Take 2,001 mg by mouth daily.), Disp:  , Rfl:  .  chlorhexidine (PERIDEX) 0.12 % solution, 15 mLs by Mouth Rinse route 2 (two) times daily., Disp: , Rfl:  .  cinacalcet (SENSIPAR) 90 MG tablet, Take 180 mg by mouth daily., Disp: , Rfl:  .  diclofenac Sodium (VOLTAREN) 1 % GEL, Apply 2 g topically 4 (four) times daily., Disp: 100 g, Rfl: 2 .  digoxin (LANOXIN) 0.125  MG tablet, Take 1 tablet (0.125 mg total) by mouth every Monday, Wednesday, and Friday., Disp: 30 tablet, Rfl: 1 .  ferric citrate (AURYXIA) 1 GM 210 MG(Fe) tablet, Take 630 mg by mouth 3 (three) times daily with meals., Disp: , Rfl:  .  fluticasone furoate-vilanterol (BREO ELLIPTA) 100-25 MCG/INH AEPB, Inhale 1 puff into the lungs 2 (two) times daily as needed (wheezing)., Disp: 1 each, Rfl: 1 .  gabapentin (NEURONTIN) 100 MG capsule, Take 1 capsule (100 mg total) by mouth 3 (three) times daily as needed (coughing uncontrollably)., Disp: 90 capsule, Rfl: 1 .  ipratropium (ATROVENT) 0.06 % nasal spray, Place 2 sprays into both nostrils 4 (four) times daily. (Patient taking differently: Place 2 sprays into both nostrils 4 (four) times daily as needed for rhinitis.), Disp: 15 mL, Rfl: 12 .  lidocaine-prilocaine (EMLA) cream, Apply 1 application topically See admin instructions. Apply topically to port access prior to dialysis - Monday, Wednesday, Friday., Disp: , Rfl: 12 .  loperamide (IMODIUM A-D) 2 MG tablet, Take 1 tablet (2 mg total) by mouth 4 (four) times daily as needed for diarrhea or loose stools. Also available OTC., Disp: 30 tablet, Rfl: 0 .  metoprolol succinate (TOPROL-XL) 25 MG 24 hr tablet, Take 1 tablet (25 mg total) by mouth daily., Disp: 30 tablet, Rfl: 3 .  midodrine (PROAMATINE) 10 MG tablet, Take 1 tablet (10 mg total) by mouth 3 (three) times daily., Disp: 90 tablet, Rfl: 3 .  multivitamin (RENA-VIT) TABS tablet, Take 1 tablet by mouth at bedtime., Disp: , Rfl:  .  nitroGLYCERIN (NITROSTAT) 0.4 MG SL tablet, Place 0.4 mg under the tongue every 5 (five) minutes as needed for chest pain. , Disp: , Rfl: 0 .  Nutritional Supplements (FEEDING SUPPLEMENT, NEPRO CARB STEADY,) LIQD, Take 237 mLs by mouth daily., Disp: , Rfl:  .  ondansetron (ZOFRAN ODT) 4 MG disintegrating tablet, Take 1 tablet (4 mg total) by mouth every 8 (eight) hours as needed for nausea or vomiting., Disp: 20 tablet,  Rfl: 0 .  oxyCODONE-acetaminophen (PERCOCET/ROXICET) 5-325 MG tablet, Take 1 tablet by mouth every 4 (four) hours as needed for severe pain., Disp: , Rfl:  .  pantoprazole (PROTONIX) 40 MG tablet, Take 40 mg by mouth 2 (two) times daily., Disp: , Rfl:  .  sucroferric oxyhydroxide (VELPHORO) 500 MG chewable tablet, Chew 500-1,000 mg by mouth See admin instructions. Takes 2 tablets with each meal and 1 tablet with snacks., Disp: , Rfl:  .  warfarin (COUMADIN) 7.5 MG tablet, Take 7.5 mg by mouth daily., Disp: , Rfl:  .  UNABLE TO FIND, Out patient physical therapy- vestibular therapy for BPPV (Patient not taking: Reported on 09/25/2020), Disp: 1 each, Rfl: 0   Allergies  Allergen Reactions  . Ace Inhibitors Itching, Cough and Other (See Comments)     Review of Systems  Constitutional: Negative.   Respiratory: Negative.  Negative for shortness of breath and wheezing.   Cardiovascular: Negative.  Negative for chest pain, palpitations and leg swelling.  Gastrointestinal: Positive for diarrhea (reports is loose and has not changed ). Negative for abdominal pain, nausea and vomiting.  Neurological: Negative for dizziness and headaches.  Psychiatric/Behavioral: Negative.      Today's Vitals   09/25/20 1539  BP: 112/68  Pulse: (!) 104  Temp: 98.4 F (36.9 C)  TempSrc: Oral  Weight: 236 lb (107 kg)  Height: 6\' 1"  (1.854 m)  PainSc: 0-No pain   Body mass index is 31.14 kg/m.   Objective:  Physical Exam Vitals reviewed.  Constitutional:      General: He is not in acute distress.    Appearance: Normal appearance.  Neurological:     General: No focal deficit present.     Mental Status: He is alert and oriented to person, place, and time.     Cranial Nerves: No cranial nerve deficit.     Motor: No weakness.  Psychiatric:        Mood and Affect: Mood normal.        Behavior: Behavior normal.        Thought Content: Thought content normal.        Judgment: Judgment normal.          Assessment And Plan:     1. Anal fissure  Slight break in skin at top of anal area may be due to sitting for long periods.   Apply a barrier cream to area  Will consider a padding  2. Mixed hyperlipidemia Chronic, stable Continue with limiting intake of fried and fatty foods - Lipid panel  3. Controlled type 2 diabetes mellitus with diabetic nephropathy, without long-term current use of insulin (HCC)  Chronic, stable  Continue with diet controlled - Hemoglobin A1c    [phone] - [place]     Patient was given opportunity to ask questions. Patient verbalized understanding of the plan and was able to repeat key elements of the plan. All questions were answered to their satisfaction.  [name], FNP   I, [name], FNP, have reviewed all documentation for this visit. The documentation on 10/26/20 for the exam, diagnosis, procedures, and orders are all accurate and complete.   IF YOU HAVE BEEN REFERRED TO A SPECIALIST, IT MAY TAKE 1-2 WEEKS TO SCHEDULE/PROCESS THE REFERRAL. IF YOU HAVE NOT HEARD FROM US/SPECIALIST IN TWO WEEKS, PLEASE GIVE [place] A CALL AT [phone].   THE PATIENT IS ENCOURAGED TO PRACTICE SOCIAL DISTANCING DUE TO THE COVID-19 PANDEMIC.

## 2020-09-26 DIAGNOSIS — N2581 Secondary hyperparathyroidism of renal origin: Secondary | ICD-10-CM | POA: Diagnosis not present

## 2020-09-26 DIAGNOSIS — Z992 Dependence on renal dialysis: Secondary | ICD-10-CM | POA: Diagnosis not present

## 2020-09-26 DIAGNOSIS — E213 Hyperparathyroidism, unspecified: Secondary | ICD-10-CM | POA: Diagnosis not present

## 2020-09-26 DIAGNOSIS — D631 Anemia in chronic kidney disease: Secondary | ICD-10-CM | POA: Diagnosis not present

## 2020-09-26 DIAGNOSIS — N186 End stage renal disease: Secondary | ICD-10-CM | POA: Diagnosis not present

## 2020-09-26 LAB — LIPID PANEL
Chol/HDL Ratio: 3.2 ratio (ref 0.0–5.0)
Cholesterol, Total: 181 mg/dL (ref 100–199)
HDL: 57 mg/dL (ref >39)
LDL Chol Calc (NIH): 99 mg/dL (ref 0–99)
Triglycerides: 141 mg/dL (ref 0–149)
VLDL Cholesterol Cal: 25 mg/dL (ref 5–40)

## 2020-09-26 LAB — HEMOGLOBIN A1C
Est. average glucose Bld gHb Est-mCnc: 120 mg/dL
Hgb A1c MFr Bld: 5.8 % — ABNORMAL HIGH (ref 4.8–5.6)

## 2020-09-29 DIAGNOSIS — D631 Anemia in chronic kidney disease: Secondary | ICD-10-CM | POA: Diagnosis not present

## 2020-09-29 DIAGNOSIS — E213 Hyperparathyroidism, unspecified: Secondary | ICD-10-CM | POA: Diagnosis not present

## 2020-09-29 DIAGNOSIS — N186 End stage renal disease: Secondary | ICD-10-CM | POA: Diagnosis not present

## 2020-09-29 DIAGNOSIS — N2581 Secondary hyperparathyroidism of renal origin: Secondary | ICD-10-CM | POA: Diagnosis not present

## 2020-09-29 DIAGNOSIS — Z992 Dependence on renal dialysis: Secondary | ICD-10-CM | POA: Diagnosis not present

## 2020-10-01 DIAGNOSIS — E213 Hyperparathyroidism, unspecified: Secondary | ICD-10-CM | POA: Diagnosis not present

## 2020-10-01 DIAGNOSIS — D689 Coagulation defect, unspecified: Secondary | ICD-10-CM | POA: Diagnosis not present

## 2020-10-01 DIAGNOSIS — N186 End stage renal disease: Secondary | ICD-10-CM | POA: Diagnosis not present

## 2020-10-01 DIAGNOSIS — D631 Anemia in chronic kidney disease: Secondary | ICD-10-CM | POA: Diagnosis not present

## 2020-10-01 DIAGNOSIS — Z992 Dependence on renal dialysis: Secondary | ICD-10-CM | POA: Diagnosis not present

## 2020-10-01 DIAGNOSIS — N2581 Secondary hyperparathyroidism of renal origin: Secondary | ICD-10-CM | POA: Diagnosis not present

## 2020-10-01 LAB — POCT INR: INR: 1.8 — AB (ref <=1.1)

## 2020-10-03 DIAGNOSIS — D631 Anemia in chronic kidney disease: Secondary | ICD-10-CM | POA: Diagnosis not present

## 2020-10-03 DIAGNOSIS — E213 Hyperparathyroidism, unspecified: Secondary | ICD-10-CM | POA: Diagnosis not present

## 2020-10-03 DIAGNOSIS — N2581 Secondary hyperparathyroidism of renal origin: Secondary | ICD-10-CM | POA: Diagnosis not present

## 2020-10-03 DIAGNOSIS — Z992 Dependence on renal dialysis: Secondary | ICD-10-CM | POA: Diagnosis not present

## 2020-10-03 DIAGNOSIS — N186 End stage renal disease: Secondary | ICD-10-CM | POA: Diagnosis not present

## 2020-10-06 ENCOUNTER — Encounter: Payer: Self-pay | Admitting: Nurse Practitioner

## 2020-10-06 DIAGNOSIS — N186 End stage renal disease: Secondary | ICD-10-CM | POA: Diagnosis not present

## 2020-10-06 DIAGNOSIS — N2581 Secondary hyperparathyroidism of renal origin: Secondary | ICD-10-CM | POA: Diagnosis not present

## 2020-10-06 DIAGNOSIS — D631 Anemia in chronic kidney disease: Secondary | ICD-10-CM | POA: Diagnosis not present

## 2020-10-06 DIAGNOSIS — Z992 Dependence on renal dialysis: Secondary | ICD-10-CM | POA: Diagnosis not present

## 2020-10-06 DIAGNOSIS — E213 Hyperparathyroidism, unspecified: Secondary | ICD-10-CM | POA: Diagnosis not present

## 2020-10-08 DIAGNOSIS — E213 Hyperparathyroidism, unspecified: Secondary | ICD-10-CM | POA: Diagnosis not present

## 2020-10-08 DIAGNOSIS — Z992 Dependence on renal dialysis: Secondary | ICD-10-CM | POA: Diagnosis not present

## 2020-10-08 DIAGNOSIS — D689 Coagulation defect, unspecified: Secondary | ICD-10-CM | POA: Diagnosis not present

## 2020-10-08 DIAGNOSIS — N186 End stage renal disease: Secondary | ICD-10-CM | POA: Diagnosis not present

## 2020-10-08 DIAGNOSIS — D631 Anemia in chronic kidney disease: Secondary | ICD-10-CM | POA: Diagnosis not present

## 2020-10-08 DIAGNOSIS — N2581 Secondary hyperparathyroidism of renal origin: Secondary | ICD-10-CM | POA: Diagnosis not present

## 2020-10-10 DIAGNOSIS — N2581 Secondary hyperparathyroidism of renal origin: Secondary | ICD-10-CM | POA: Diagnosis not present

## 2020-10-10 DIAGNOSIS — Z992 Dependence on renal dialysis: Secondary | ICD-10-CM | POA: Diagnosis not present

## 2020-10-10 DIAGNOSIS — N186 End stage renal disease: Secondary | ICD-10-CM | POA: Diagnosis not present

## 2020-10-10 DIAGNOSIS — E213 Hyperparathyroidism, unspecified: Secondary | ICD-10-CM | POA: Diagnosis not present

## 2020-10-10 DIAGNOSIS — D631 Anemia in chronic kidney disease: Secondary | ICD-10-CM | POA: Diagnosis not present

## 2020-10-13 DIAGNOSIS — N2581 Secondary hyperparathyroidism of renal origin: Secondary | ICD-10-CM | POA: Diagnosis not present

## 2020-10-13 DIAGNOSIS — E213 Hyperparathyroidism, unspecified: Secondary | ICD-10-CM | POA: Diagnosis not present

## 2020-10-13 DIAGNOSIS — D631 Anemia in chronic kidney disease: Secondary | ICD-10-CM | POA: Diagnosis not present

## 2020-10-13 DIAGNOSIS — N186 End stage renal disease: Secondary | ICD-10-CM | POA: Diagnosis not present

## 2020-10-13 DIAGNOSIS — Z992 Dependence on renal dialysis: Secondary | ICD-10-CM | POA: Diagnosis not present

## 2020-10-15 DIAGNOSIS — D689 Coagulation defect, unspecified: Secondary | ICD-10-CM | POA: Diagnosis not present

## 2020-10-15 DIAGNOSIS — N2581 Secondary hyperparathyroidism of renal origin: Secondary | ICD-10-CM | POA: Diagnosis not present

## 2020-10-15 DIAGNOSIS — N186 End stage renal disease: Secondary | ICD-10-CM | POA: Diagnosis not present

## 2020-10-15 DIAGNOSIS — Z992 Dependence on renal dialysis: Secondary | ICD-10-CM | POA: Diagnosis not present

## 2020-10-15 DIAGNOSIS — E213 Hyperparathyroidism, unspecified: Secondary | ICD-10-CM | POA: Diagnosis not present

## 2020-10-15 DIAGNOSIS — D631 Anemia in chronic kidney disease: Secondary | ICD-10-CM | POA: Diagnosis not present

## 2020-10-17 DIAGNOSIS — Z992 Dependence on renal dialysis: Secondary | ICD-10-CM | POA: Diagnosis not present

## 2020-10-17 DIAGNOSIS — N2581 Secondary hyperparathyroidism of renal origin: Secondary | ICD-10-CM | POA: Diagnosis not present

## 2020-10-17 DIAGNOSIS — D631 Anemia in chronic kidney disease: Secondary | ICD-10-CM | POA: Diagnosis not present

## 2020-10-17 DIAGNOSIS — E213 Hyperparathyroidism, unspecified: Secondary | ICD-10-CM | POA: Diagnosis not present

## 2020-10-17 DIAGNOSIS — N186 End stage renal disease: Secondary | ICD-10-CM | POA: Diagnosis not present

## 2020-10-20 DIAGNOSIS — E213 Hyperparathyroidism, unspecified: Secondary | ICD-10-CM | POA: Diagnosis not present

## 2020-10-20 DIAGNOSIS — Z992 Dependence on renal dialysis: Secondary | ICD-10-CM | POA: Diagnosis not present

## 2020-10-20 DIAGNOSIS — N186 End stage renal disease: Secondary | ICD-10-CM | POA: Diagnosis not present

## 2020-10-20 DIAGNOSIS — D631 Anemia in chronic kidney disease: Secondary | ICD-10-CM | POA: Diagnosis not present

## 2020-10-20 DIAGNOSIS — N2581 Secondary hyperparathyroidism of renal origin: Secondary | ICD-10-CM | POA: Diagnosis not present

## 2020-10-22 DIAGNOSIS — I120 Hypertensive chronic kidney disease with stage 5 chronic kidney disease or end stage renal disease: Secondary | ICD-10-CM | POA: Diagnosis not present

## 2020-10-22 DIAGNOSIS — I4892 Unspecified atrial flutter: Secondary | ICD-10-CM | POA: Diagnosis not present

## 2020-10-22 DIAGNOSIS — E213 Hyperparathyroidism, unspecified: Secondary | ICD-10-CM | POA: Diagnosis not present

## 2020-10-22 DIAGNOSIS — D631 Anemia in chronic kidney disease: Secondary | ICD-10-CM | POA: Diagnosis not present

## 2020-10-22 DIAGNOSIS — Z992 Dependence on renal dialysis: Secondary | ICD-10-CM | POA: Diagnosis not present

## 2020-10-22 DIAGNOSIS — N2581 Secondary hyperparathyroidism of renal origin: Secondary | ICD-10-CM | POA: Diagnosis not present

## 2020-10-22 DIAGNOSIS — N186 End stage renal disease: Secondary | ICD-10-CM | POA: Diagnosis not present

## 2020-10-22 LAB — POCT INR: INR: 2.1 — AB (ref <=1.1)

## 2020-10-24 DIAGNOSIS — Z992 Dependence on renal dialysis: Secondary | ICD-10-CM | POA: Diagnosis not present

## 2020-10-24 DIAGNOSIS — D631 Anemia in chronic kidney disease: Secondary | ICD-10-CM | POA: Diagnosis not present

## 2020-10-24 DIAGNOSIS — E213 Hyperparathyroidism, unspecified: Secondary | ICD-10-CM | POA: Diagnosis not present

## 2020-10-24 DIAGNOSIS — N2581 Secondary hyperparathyroidism of renal origin: Secondary | ICD-10-CM | POA: Diagnosis not present

## 2020-10-24 DIAGNOSIS — N186 End stage renal disease: Secondary | ICD-10-CM | POA: Diagnosis not present

## 2020-10-26 ENCOUNTER — Encounter: Payer: Self-pay | Admitting: Nurse Practitioner

## 2020-10-26 MED ORDER — DUODERM CGF EXTRA THIN EX MISC: 1.0000 | Freq: Every day | CUTANEOUS | 2 refills | Status: AC | PRN

## 2020-10-27 ENCOUNTER — Encounter: Payer: Self-pay | Admitting: Nurse Practitioner

## 2020-10-27 DIAGNOSIS — N186 End stage renal disease: Secondary | ICD-10-CM | POA: Diagnosis not present

## 2020-10-27 DIAGNOSIS — Z992 Dependence on renal dialysis: Secondary | ICD-10-CM | POA: Diagnosis not present

## 2020-10-27 DIAGNOSIS — D631 Anemia in chronic kidney disease: Secondary | ICD-10-CM | POA: Diagnosis not present

## 2020-10-27 DIAGNOSIS — E213 Hyperparathyroidism, unspecified: Secondary | ICD-10-CM | POA: Diagnosis not present

## 2020-10-27 DIAGNOSIS — N2581 Secondary hyperparathyroidism of renal origin: Secondary | ICD-10-CM | POA: Diagnosis not present

## 2020-10-29 DIAGNOSIS — E213 Hyperparathyroidism, unspecified: Secondary | ICD-10-CM | POA: Diagnosis not present

## 2020-10-29 DIAGNOSIS — D689 Coagulation defect, unspecified: Secondary | ICD-10-CM | POA: Diagnosis not present

## 2020-10-29 DIAGNOSIS — N186 End stage renal disease: Secondary | ICD-10-CM | POA: Diagnosis not present

## 2020-10-29 DIAGNOSIS — Z992 Dependence on renal dialysis: Secondary | ICD-10-CM | POA: Diagnosis not present

## 2020-10-29 DIAGNOSIS — D631 Anemia in chronic kidney disease: Secondary | ICD-10-CM | POA: Diagnosis not present

## 2020-10-29 DIAGNOSIS — N2581 Secondary hyperparathyroidism of renal origin: Secondary | ICD-10-CM | POA: Diagnosis not present

## 2020-10-31 DIAGNOSIS — Z992 Dependence on renal dialysis: Secondary | ICD-10-CM | POA: Diagnosis not present

## 2020-10-31 DIAGNOSIS — D631 Anemia in chronic kidney disease: Secondary | ICD-10-CM | POA: Diagnosis not present

## 2020-10-31 DIAGNOSIS — E213 Hyperparathyroidism, unspecified: Secondary | ICD-10-CM | POA: Diagnosis not present

## 2020-10-31 DIAGNOSIS — N186 End stage renal disease: Secondary | ICD-10-CM | POA: Diagnosis not present

## 2020-10-31 DIAGNOSIS — N2581 Secondary hyperparathyroidism of renal origin: Secondary | ICD-10-CM | POA: Diagnosis not present

## 2020-11-03 DIAGNOSIS — N2581 Secondary hyperparathyroidism of renal origin: Secondary | ICD-10-CM | POA: Diagnosis not present

## 2020-11-03 DIAGNOSIS — E213 Hyperparathyroidism, unspecified: Secondary | ICD-10-CM | POA: Diagnosis not present

## 2020-11-03 DIAGNOSIS — Z992 Dependence on renal dialysis: Secondary | ICD-10-CM | POA: Diagnosis not present

## 2020-11-03 DIAGNOSIS — D631 Anemia in chronic kidney disease: Secondary | ICD-10-CM | POA: Diagnosis not present

## 2020-11-03 DIAGNOSIS — N186 End stage renal disease: Secondary | ICD-10-CM | POA: Diagnosis not present

## 2020-11-04 DIAGNOSIS — M19019 Primary osteoarthritis, unspecified shoulder: Secondary | ICD-10-CM | POA: Diagnosis not present

## 2020-11-04 DIAGNOSIS — M542 Cervicalgia: Secondary | ICD-10-CM | POA: Diagnosis not present

## 2020-11-04 DIAGNOSIS — M47892 Other spondylosis, cervical region: Secondary | ICD-10-CM | POA: Diagnosis not present

## 2020-11-04 DIAGNOSIS — M25512 Pain in left shoulder: Secondary | ICD-10-CM | POA: Diagnosis not present

## 2020-11-04 DIAGNOSIS — M25511 Pain in right shoulder: Secondary | ICD-10-CM | POA: Diagnosis not present

## 2020-11-05 DIAGNOSIS — Z992 Dependence on renal dialysis: Secondary | ICD-10-CM | POA: Diagnosis not present

## 2020-11-05 DIAGNOSIS — D631 Anemia in chronic kidney disease: Secondary | ICD-10-CM | POA: Diagnosis not present

## 2020-11-05 DIAGNOSIS — N186 End stage renal disease: Secondary | ICD-10-CM | POA: Diagnosis not present

## 2020-11-05 DIAGNOSIS — N2581 Secondary hyperparathyroidism of renal origin: Secondary | ICD-10-CM | POA: Diagnosis not present

## 2020-11-05 DIAGNOSIS — D689 Coagulation defect, unspecified: Secondary | ICD-10-CM | POA: Diagnosis not present

## 2020-11-05 DIAGNOSIS — E213 Hyperparathyroidism, unspecified: Secondary | ICD-10-CM | POA: Diagnosis not present

## 2020-11-07 DIAGNOSIS — N2581 Secondary hyperparathyroidism of renal origin: Secondary | ICD-10-CM | POA: Diagnosis not present

## 2020-11-07 DIAGNOSIS — Z992 Dependence on renal dialysis: Secondary | ICD-10-CM | POA: Diagnosis not present

## 2020-11-07 DIAGNOSIS — D631 Anemia in chronic kidney disease: Secondary | ICD-10-CM | POA: Diagnosis not present

## 2020-11-07 DIAGNOSIS — E213 Hyperparathyroidism, unspecified: Secondary | ICD-10-CM | POA: Diagnosis not present

## 2020-11-07 DIAGNOSIS — N186 End stage renal disease: Secondary | ICD-10-CM | POA: Diagnosis not present

## 2020-11-10 DIAGNOSIS — E213 Hyperparathyroidism, unspecified: Secondary | ICD-10-CM | POA: Diagnosis not present

## 2020-11-10 DIAGNOSIS — N186 End stage renal disease: Secondary | ICD-10-CM | POA: Diagnosis not present

## 2020-11-10 DIAGNOSIS — N2581 Secondary hyperparathyroidism of renal origin: Secondary | ICD-10-CM | POA: Diagnosis not present

## 2020-11-10 DIAGNOSIS — D631 Anemia in chronic kidney disease: Secondary | ICD-10-CM | POA: Diagnosis not present

## 2020-11-10 DIAGNOSIS — Z992 Dependence on renal dialysis: Secondary | ICD-10-CM | POA: Diagnosis not present

## 2020-11-11 DIAGNOSIS — N2581 Secondary hyperparathyroidism of renal origin: Secondary | ICD-10-CM | POA: Diagnosis not present

## 2020-11-11 DIAGNOSIS — Z992 Dependence on renal dialysis: Secondary | ICD-10-CM | POA: Diagnosis not present

## 2020-11-11 DIAGNOSIS — E213 Hyperparathyroidism, unspecified: Secondary | ICD-10-CM | POA: Diagnosis not present

## 2020-11-11 DIAGNOSIS — D631 Anemia in chronic kidney disease: Secondary | ICD-10-CM | POA: Diagnosis not present

## 2020-11-11 DIAGNOSIS — N186 End stage renal disease: Secondary | ICD-10-CM | POA: Diagnosis not present

## 2020-11-12 DIAGNOSIS — N2581 Secondary hyperparathyroidism of renal origin: Secondary | ICD-10-CM | POA: Diagnosis not present

## 2020-11-12 DIAGNOSIS — Z992 Dependence on renal dialysis: Secondary | ICD-10-CM | POA: Diagnosis not present

## 2020-11-12 DIAGNOSIS — E213 Hyperparathyroidism, unspecified: Secondary | ICD-10-CM | POA: Diagnosis not present

## 2020-11-12 DIAGNOSIS — N186 End stage renal disease: Secondary | ICD-10-CM | POA: Diagnosis not present

## 2020-11-12 DIAGNOSIS — D631 Anemia in chronic kidney disease: Secondary | ICD-10-CM | POA: Diagnosis not present

## 2020-11-14 DIAGNOSIS — N2581 Secondary hyperparathyroidism of renal origin: Secondary | ICD-10-CM | POA: Diagnosis not present

## 2020-11-14 DIAGNOSIS — D631 Anemia in chronic kidney disease: Secondary | ICD-10-CM | POA: Diagnosis not present

## 2020-11-14 DIAGNOSIS — N186 End stage renal disease: Secondary | ICD-10-CM | POA: Diagnosis not present

## 2020-11-14 DIAGNOSIS — E213 Hyperparathyroidism, unspecified: Secondary | ICD-10-CM | POA: Diagnosis not present

## 2020-11-14 DIAGNOSIS — D689 Coagulation defect, unspecified: Secondary | ICD-10-CM | POA: Diagnosis not present

## 2020-11-14 DIAGNOSIS — Z992 Dependence on renal dialysis: Secondary | ICD-10-CM | POA: Diagnosis not present

## 2020-11-14 LAB — POCT INR: INR: 2.7 — AB (ref <=1.1)

## 2020-11-17 DIAGNOSIS — N186 End stage renal disease: Secondary | ICD-10-CM | POA: Diagnosis not present

## 2020-11-17 DIAGNOSIS — D631 Anemia in chronic kidney disease: Secondary | ICD-10-CM | POA: Diagnosis not present

## 2020-11-17 DIAGNOSIS — N2581 Secondary hyperparathyroidism of renal origin: Secondary | ICD-10-CM | POA: Diagnosis not present

## 2020-11-17 DIAGNOSIS — Z992 Dependence on renal dialysis: Secondary | ICD-10-CM | POA: Diagnosis not present

## 2020-11-17 DIAGNOSIS — E213 Hyperparathyroidism, unspecified: Secondary | ICD-10-CM | POA: Diagnosis not present

## 2020-11-19 ENCOUNTER — Telehealth: Payer: Self-pay | Admitting: *Deleted

## 2020-11-19 ENCOUNTER — Encounter: Payer: Self-pay | Admitting: Nurse Practitioner

## 2020-11-19 ENCOUNTER — Ambulatory Visit: Payer: Medicare Other | Admitting: Nurse Practitioner

## 2020-11-19 DIAGNOSIS — N186 End stage renal disease: Secondary | ICD-10-CM | POA: Diagnosis not present

## 2020-11-19 DIAGNOSIS — D689 Coagulation defect, unspecified: Secondary | ICD-10-CM | POA: Diagnosis not present

## 2020-11-19 DIAGNOSIS — E213 Hyperparathyroidism, unspecified: Secondary | ICD-10-CM | POA: Diagnosis not present

## 2020-11-19 DIAGNOSIS — N2581 Secondary hyperparathyroidism of renal origin: Secondary | ICD-10-CM | POA: Diagnosis not present

## 2020-11-19 DIAGNOSIS — D631 Anemia in chronic kidney disease: Secondary | ICD-10-CM | POA: Diagnosis not present

## 2020-11-19 DIAGNOSIS — Z992 Dependence on renal dialysis: Secondary | ICD-10-CM | POA: Diagnosis not present

## 2020-11-19 LAB — POCT INR: INR: 1.6 — AB (ref <=1.1)

## 2020-11-19 NOTE — Chronic Care Management (AMB) (Signed)
  Chronic Care Management   Outreach Note  11/19/2020 Name: DELONTE MUSICH MRN: [id number] DOB: 08/03/1960  KAREEM CATHEY is a 60 y.o. year old male who is a primary care patient of [name], [place]. I reached out to [name] by phone today in response to a referral sent by Mr. Rhydian Baldi Caldwell's PCP, [name], FNP.      An unsuccessful telephone outreach was attempted today. The patient was referred to the case management team for assistance with care management and care coordination.   Follow Up Plan: The care management team will reach out to the patient again over the next 7 days.  If patient returns call to provider office, please advise to call [place] at [phone].  [place] Management

## 2020-11-20 DIAGNOSIS — I120 Hypertensive chronic kidney disease with stage 5 chronic kidney disease or end stage renal disease: Secondary | ICD-10-CM | POA: Diagnosis not present

## 2020-11-20 DIAGNOSIS — M19019 Primary osteoarthritis, unspecified shoulder: Secondary | ICD-10-CM | POA: Diagnosis not present

## 2020-11-20 DIAGNOSIS — M47892 Other spondylosis, cervical region: Secondary | ICD-10-CM | POA: Diagnosis not present

## 2020-11-20 DIAGNOSIS — M25511 Pain in right shoulder: Secondary | ICD-10-CM | POA: Diagnosis not present

## 2020-11-20 DIAGNOSIS — M25512 Pain in left shoulder: Secondary | ICD-10-CM | POA: Diagnosis not present

## 2020-11-20 DIAGNOSIS — Z992 Dependence on renal dialysis: Secondary | ICD-10-CM | POA: Diagnosis not present

## 2020-11-20 DIAGNOSIS — N186 End stage renal disease: Secondary | ICD-10-CM | POA: Diagnosis not present

## 2020-11-21 ENCOUNTER — Encounter: Payer: Self-pay | Admitting: Nurse Practitioner

## 2020-11-21 DIAGNOSIS — N186 End stage renal disease: Secondary | ICD-10-CM | POA: Diagnosis not present

## 2020-11-21 DIAGNOSIS — Z992 Dependence on renal dialysis: Secondary | ICD-10-CM | POA: Diagnosis not present

## 2020-11-21 DIAGNOSIS — I120 Hypertensive chronic kidney disease with stage 5 chronic kidney disease or end stage renal disease: Secondary | ICD-10-CM | POA: Diagnosis not present

## 2020-11-21 DIAGNOSIS — D631 Anemia in chronic kidney disease: Secondary | ICD-10-CM | POA: Diagnosis not present

## 2020-11-21 DIAGNOSIS — N2581 Secondary hyperparathyroidism of renal origin: Secondary | ICD-10-CM | POA: Diagnosis not present

## 2020-11-21 DIAGNOSIS — Z20822 Contact with and (suspected) exposure to covid-19: Secondary | ICD-10-CM | POA: Diagnosis not present

## 2020-11-24 DIAGNOSIS — D631 Anemia in chronic kidney disease: Secondary | ICD-10-CM | POA: Diagnosis not present

## 2020-11-24 DIAGNOSIS — N2581 Secondary hyperparathyroidism of renal origin: Secondary | ICD-10-CM | POA: Diagnosis not present

## 2020-11-24 DIAGNOSIS — Z992 Dependence on renal dialysis: Secondary | ICD-10-CM | POA: Diagnosis not present

## 2020-11-24 DIAGNOSIS — N186 End stage renal disease: Secondary | ICD-10-CM | POA: Diagnosis not present

## 2020-11-24 DIAGNOSIS — Z20822 Contact with and (suspected) exposure to covid-19: Secondary | ICD-10-CM | POA: Diagnosis not present

## 2020-11-26 DIAGNOSIS — D631 Anemia in chronic kidney disease: Secondary | ICD-10-CM | POA: Diagnosis not present

## 2020-11-26 DIAGNOSIS — I4892 Unspecified atrial flutter: Secondary | ICD-10-CM | POA: Diagnosis not present

## 2020-11-26 DIAGNOSIS — N2581 Secondary hyperparathyroidism of renal origin: Secondary | ICD-10-CM | POA: Diagnosis not present

## 2020-11-26 DIAGNOSIS — Z992 Dependence on renal dialysis: Secondary | ICD-10-CM | POA: Diagnosis not present

## 2020-11-26 DIAGNOSIS — E1129 Type 2 diabetes mellitus with other diabetic kidney complication: Secondary | ICD-10-CM | POA: Diagnosis not present

## 2020-11-26 DIAGNOSIS — N186 End stage renal disease: Secondary | ICD-10-CM | POA: Diagnosis not present

## 2020-11-26 NOTE — Chronic Care Management (AMB) (Signed)
  Chronic Care Management   Note  11/26/2020 Name: [name] MRN: [id number] DOB: 08-23-1960  [name] is a 60 y.o. year old male who is a primary care patient of [name], [place]. I reached out to [name] by phone today in response to a referral sent by [name]'s PCP, [name], FNP.      [name] was given information about Chronic Care Management services today including:  CCM service includes personalized support from designated clinical staff supervised by his physician, including individualized plan of care and coordination with other care providers 24/7 contact phone numbers for assistance for urgent and routine care needs. Service will only be billed when office clinical staff spend 20 minutes or more in a month to coordinate care. Only one practitioner may furnish and bill the service in a calendar month. The patient may stop CCM services at any time (effective at the end of the month) by phone call to the office staff. The patient will be responsible for cost sharing (co-pay) of up to 20% of the service fee (after annual deductible is met).  Patient agreed to services and verbal consent obtained.   Follow up plan: Telephone appointment with care management team member scheduled for: 01/01/2021  [place] Management

## 2020-11-27 ENCOUNTER — Ambulatory Visit (INDEPENDENT_AMBULATORY_CARE_PROVIDER_SITE_OTHER): Payer: Medicare Other | Admitting: Nurse Practitioner

## 2020-11-27 ENCOUNTER — Other Ambulatory Visit: Payer: Self-pay

## 2020-11-27 VITALS — BP 108/62 | HR 94 | Temp 98.2°F | Ht 73.0 in | Wt 238.4 lb

## 2020-11-27 DIAGNOSIS — I132 Hypertensive heart and chronic kidney disease with heart failure and with stage 5 chronic kidney disease, or end stage renal disease: Secondary | ICD-10-CM | POA: Diagnosis not present

## 2020-11-27 DIAGNOSIS — N186 End stage renal disease: Secondary | ICD-10-CM

## 2020-11-27 DIAGNOSIS — E1122 Type 2 diabetes mellitus with diabetic chronic kidney disease: Secondary | ICD-10-CM

## 2020-11-27 DIAGNOSIS — E782 Mixed hyperlipidemia: Secondary | ICD-10-CM | POA: Diagnosis not present

## 2020-11-27 DIAGNOSIS — Z992 Dependence on renal dialysis: Secondary | ICD-10-CM | POA: Diagnosis not present

## 2020-11-27 DIAGNOSIS — E1121 Type 2 diabetes mellitus with diabetic nephropathy: Secondary | ICD-10-CM | POA: Diagnosis not present

## 2020-11-27 LAB — HEMOGLOBIN A1C
Est. average glucose Bld gHb Est-mCnc: 105 mg/dL
Hgb A1c MFr Bld: 5.3 % (ref 4.8–5.6)

## 2020-11-27 NOTE — Progress Notes (Signed)
I,Katawbba Wiggins,acting as a Education administrator for [employer], NP.,have documented all relevant documentation on the behalf of [employer], NP,as directed by  Bary Castilla, NP while in the presence of Bary Castilla, NP.  This visit occurred during the SARS-CoV-2 public health emergency.  Safety protocols were in place, including screening questions prior to the visit, additional usage of staff PPE, and extensive cleaning of exam room while observing appropriate contact time as indicated for disinfecting solutions.  Subjective:     Patient ID: [name] , male    DOB: 10/08/1960 , 60 y.o.   MRN: [id number]   Chief Complaint  Patient presents with   Diabetes   Hyperlipidemia    HPI  The patient is here today for a follow-up on diabetes and blood pressure. He gets dialysis M,W,F and gets blood work their every Wed. He is doing well. He is watching what he eats. He had a Hgb A1c 2 months ago. He gets blood work done at dialysis. He doenst work.  His blood sugar at home runs around 103-106. He is trying to be healthy and exercise as much as he can. He has no other concerns today.   Diabetes Pertinent negatives for hypoglycemia include no dizziness or headaches. Pertinent negatives for diabetes include no chest pain, no fatigue, no polydipsia, no polyphagia, no polyuria and no weakness.  Hyperlipidemia Pertinent negatives include no chest pain, myalgias or shortness of breath.    Past Medical History:  Diagnosis Date   Acute lower GI bleeding 06/21/2018   Acute respiratory failure with hypoxia (HCC) 01/13/2015   Atrial flutter (HCC)    Bile leak, postoperative 03/15/2016   Biliary dyskinesia 02/12/2016   Blind right eye    Hx: of partial blindness in right eye   Cardiomyopathy    CHF (congestive heart failure) ([place])    Coronary artery disease    normal coronaries by 10/10/08 cath, Cardiac Cath 08-04-12 epic.Dr. Doylene Canard follows   Diabetes mellitus    pt. states he's  borderline diabetic., no longer taking med,- off med. since 2013   Dialysis patient [facility])    Mon-Wed-Fri([place])- Left AV fistula   Diverticulitis November 2016   reoccurred in December 2016   Diverticulitis of intestine without perforation or abscess without bleeding 04/21/2015   DVT (deep venous thrombosis) (HCC)    ESRD (end stage renal disease) ([place])    GERD (gastroesophageal reflux disease)    Gout    Hemorrhage of left kidney 01/13/2018   History of nephrectomy 07/04/2012   History of right nephrectomy in 2002 for renal cell carcinoma    History of unilateral nephrectomy    Hypertension    Low iron    Myocardial infarction [facility]) ?2006   Renal cell carcinoma    dialysis- MWF- [address]- Dr. Mercy Moore follows.   Renal insufficiency    Shortness of breath 05/19/11   "at rest, lying down, w/exertion"   Stroke [facility]) 02/2011   05/19/11 denies residual   Umbilical hernia 03/50/09   unrepaired   Wears glasses      Family History  Problem Relation Age of Onset   Hypertension Mother    Kidney disease Mother      Current Outpatient Medications:    albuterol (VENTOLIN HFA) 108 (90 Base) MCG/ACT inhaler, Inhale 2 puffs into the lungs every 6 (six) hours as needed for wheezing or shortness of breath., Disp: 18 g, Rfl: 2   amiodarone (PACERONE) 200 MG tablet, Take 1 tablet (200 mg  total) by mouth 2 (two) times daily., Disp: 60 tablet, Rfl: 1   calcium acetate (PHOSLO) 667 MG capsule, Take 1-3 capsules (667-2,001 mg total) by mouth See admin instructions. Take 2001 mg capsules with meals three times daily and 667 mg capsule with snacks. (Patient taking differently: Take 2,001 mg by mouth daily.), Disp:  , Rfl:    chlorhexidine (PERIDEX) 0.12 % solution, 15 mLs by Mouth Rinse route 2 (two) times daily., Disp: , Rfl:    cinacalcet (SENSIPAR) 90 MG tablet, Take 180 mg by mouth daily., Disp: , Rfl:    Control Gel Formula Dressing (DUODERM CGF EXTRA THIN) MISC, Apply 1  each topically daily as needed., Disp: 10 each, Rfl: 2   diclofenac Sodium (VOLTAREN) 1 % GEL, Apply 2 g topically 4 (four) times daily., Disp: 100 g, Rfl: 2   digoxin (LANOXIN) 0.125 MG tablet, Take 1 tablet (0.125 mg total) by mouth every Monday, Wednesday, and Friday., Disp: 30 tablet, Rfl: 1   ferric citrate (AURYXIA) 1 GM 210 MG(Fe) tablet, Take 630 mg by mouth 3 (three) times daily with meals., Disp: , Rfl:    fluticasone furoate-vilanterol (BREO ELLIPTA) 100-25 MCG/INH AEPB, Inhale 1 puff into the lungs 2 (two) times daily as needed (wheezing)., Disp: 1 each, Rfl: 1   gabapentin (NEURONTIN) 100 MG capsule, Take 1 capsule (100 mg total) by mouth 3 (three) times daily as needed (coughing uncontrollably)., Disp: 90 capsule, Rfl: 1   ipratropium (ATROVENT) 0.06 % nasal spray, Place 2 sprays into both nostrils 4 (four) times daily. (Patient taking differently: Place 2 sprays into both nostrils 4 (four) times daily as needed for rhinitis.), Disp: 15 mL, Rfl: 12   lidocaine-prilocaine (EMLA) cream, Apply 1 application topically See admin instructions. Apply topically to port access prior to dialysis - Monday, Wednesday, Friday., Disp: , Rfl: 12   loperamide (IMODIUM A-D) 2 MG tablet, Take 1 tablet (2 mg total) by mouth 4 (four) times daily as needed for diarrhea or loose stools. Also available OTC., Disp: 30 tablet, Rfl: 0   metoprolol succinate (TOPROL-XL) 25 MG 24 hr tablet, Take 1 tablet (25 mg total) by mouth daily., Disp: 30 tablet, Rfl: 3   midodrine (PROAMATINE) 10 MG tablet, Take 1 tablet (10 mg total) by mouth 3 (three) times daily., Disp: 90 tablet, Rfl: 3   multivitamin (RENA-VIT) TABS tablet, Take 1 tablet by mouth at bedtime., Disp: , Rfl:    nitroGLYCERIN (NITROSTAT) 0.4 MG SL tablet, Place 0.4 mg under the tongue every 5 (five) minutes as needed for chest pain. , Disp: , Rfl: 0   Nutritional Supplements (FEEDING SUPPLEMENT, NEPRO CARB STEADY,) LIQD, Take 237 mLs by mouth daily., Disp: ,  Rfl:    ondansetron (ZOFRAN ODT) 4 MG disintegrating tablet, Take 1 tablet (4 mg total) by mouth every 8 (eight) hours as needed for nausea or vomiting., Disp: 20 tablet, Rfl: 0   oxyCODONE-acetaminophen (PERCOCET/ROXICET) 5-325 MG tablet, Take 1 tablet by mouth every 4 (four) hours as needed for severe pain., Disp: , Rfl:    pantoprazole (PROTONIX) 40 MG tablet, Take 40 mg by mouth 2 (two) times daily., Disp: , Rfl:    sucroferric oxyhydroxide (VELPHORO) 500 MG chewable tablet, Chew 500-1,000 mg by mouth See admin instructions. Takes 2 tablets with each meal and 1 tablet with snacks., Disp: , Rfl:    UNABLE TO FIND, Out patient physical therapy- vestibular therapy for BPPV (Patient not taking: Reported on 09/25/2020), Disp: 1 each, Rfl: 0  warfarin (COUMADIN) 7.5 MG tablet, Take 7.5 mg by mouth daily., Disp: , Rfl:    Allergies  Allergen Reactions   Ace Inhibitors Itching, Cough and Other (See Comments)     Review of Systems  Constitutional: Negative.  Negative for fatigue and fever.  HENT:  Negative for congestion, sinus pressure and sinus pain.   Respiratory: Negative.  Negative for shortness of breath and wheezing.   Cardiovascular: Negative.  Negative for chest pain and palpitations.  Gastrointestinal: Negative.   Endocrine: Negative for polydipsia, polyphagia and polyuria.  Musculoskeletal:  Negative for arthralgias and myalgias.  Neurological:  Negative for dizziness, weakness and headaches.  Psychiatric/Behavioral: Negative.    All other systems reviewed and are negative.   Today's Vitals   11/27/20 0840  BP: 108/62  Pulse: 94  Temp: 98.2 F (36.8 C)  TempSrc: Oral  Weight: 238 lb 6.4 oz (108.1 kg)  Height: 6\' 1"  (1.854 m)   Body mass index is 31.45 kg/m.  Wt Readings from Last 3 Encounters:  11/27/20 238 lb 6.4 oz (108.1 kg)  09/25/20 236 lb (107 kg)  06/09/20 239 lb 3.2 oz (108.5 kg)    BP Readings from Last 3 Encounters:  11/27/20 108/62  09/25/20 112/68   06/09/20 (!) 108/59    Objective:  Physical Exam Constitutional:      Appearance: Normal appearance.  HENT:     Head: Normocephalic and atraumatic.  Cardiovascular:     Rate and Rhythm: Normal rate and regular rhythm.     Pulses: Normal pulses.     Heart sounds: Normal heart sounds. No murmur heard.    Comments: Fistula for dialysis  Pulmonary:     Effort: Pulmonary effort is normal. No respiratory distress.     Breath sounds: Normal breath sounds. No wheezing.  Musculoskeletal:        General: Normal range of motion.  Skin:    General: Skin is warm and dry.     Capillary Refill: Capillary refill takes less than 2 seconds.  Neurological:     Mental Status: He is alert.        Assessment And Plan:     1. Dependence on renal dialysis [facility]) He gets dialysis every M,W,F  -gets regular lab work there every Wed.  -Follows up with nephrologist.   2. Controlled type 2 diabetes mellitus with diabetic nephropathy, without long-term current use of insulin ([place]) -The patient is on dialysis M,W,F -He is diet controlled for his diabetes.  -He gets regular lab work done at dialysis. -Will check his Hgb A1c today. --Discussed with patient the importance of glycemic control and long term complications from uncontrolled diabetes. Discussed with the patient the importance of compliance with home glucose monitoring, diet which includes decrease amount of sugary drinks and foods. Importance of exercise was also discussed with the patient. Importance of eye exams, self foot care and compliance to office visits was also discussed with the patient.  - Hemoglobin A1c  3. Mixed hyperlipidemia  -diet controlled.  --Chronic, stable  -Encouraged to continue with a low fat diet avoiding fast food and fried food.    Follow up: if symptoms persist or do not get better.   Staying healthy and adopting a healthy lifestyle for your overall health is important. You should eat 7 or more servings of  fruits and vegetables per day. You should drink plenty of water to keep yourself hydrated and your kidneys healthy. This includes about 65-80+ fluid ounces of water. Limit your  intake of animal fats especially for elevated cholesterol. Avoid highly processed food and limit your salt intake if you have hypertension. Avoid foods high in saturated/Trans fats. Along with a healthy diet it is also very important to maintain time for yourself to maintain a healthy mental health with low stress levels. You should get atleast 150 min of moderate intensity exercise weekly for a healthy heart. Along with eating right and exercising, aim for at least 7-9 hours of sleep daily.  Eat more whole grains which includes barley, wheat berries, oats, brown rice and whole wheat pasta. Use healthy plant oils which include olive, soy, corn, sunflower and peanut. Limit your caffeine and sugary drinks. Limit your intake of fast foods. Limit milk and dairy products to one or two daily servings.    Patient was given opportunity to ask questions. Patient verbalized understanding of the plan and was able to repeat key elements of the plan. All questions were answered to their satisfaction.  Raman Sharon Rubis, DNP   I, Raman Arnett Galindez have reviewed all documentation for this visit. The documentation on 11/27/20 for the exam, diagnosis, procedures, and orders are all accurate and complete.    IF YOU HAVE BEEN REFERRED TO A SPECIALIST, IT MAY TAKE 1-2 WEEKS TO SCHEDULE/PROCESS THE REFERRAL. IF YOU HAVE NOT HEARD FROM US/SPECIALIST IN TWO WEEKS, PLEASE GIVE [place] A CALL AT [phone].   THE PATIENT IS ENCOURAGED TO PRACTICE SOCIAL DISTANCING DUE TO THE COVID-19 PANDEMIC.

## 2020-11-28 DIAGNOSIS — N2581 Secondary hyperparathyroidism of renal origin: Secondary | ICD-10-CM | POA: Diagnosis not present

## 2020-11-28 DIAGNOSIS — N186 End stage renal disease: Secondary | ICD-10-CM | POA: Diagnosis not present

## 2020-11-28 DIAGNOSIS — D631 Anemia in chronic kidney disease: Secondary | ICD-10-CM | POA: Diagnosis not present

## 2020-11-28 DIAGNOSIS — Z992 Dependence on renal dialysis: Secondary | ICD-10-CM | POA: Diagnosis not present

## 2020-12-01 DIAGNOSIS — N186 End stage renal disease: Secondary | ICD-10-CM | POA: Diagnosis not present

## 2020-12-01 DIAGNOSIS — D631 Anemia in chronic kidney disease: Secondary | ICD-10-CM | POA: Diagnosis not present

## 2020-12-01 DIAGNOSIS — Z992 Dependence on renal dialysis: Secondary | ICD-10-CM | POA: Diagnosis not present

## 2020-12-01 DIAGNOSIS — N2581 Secondary hyperparathyroidism of renal origin: Secondary | ICD-10-CM | POA: Diagnosis not present

## 2020-12-02 DIAGNOSIS — M19019 Primary osteoarthritis, unspecified shoulder: Secondary | ICD-10-CM | POA: Diagnosis not present

## 2020-12-03 DIAGNOSIS — N2581 Secondary hyperparathyroidism of renal origin: Secondary | ICD-10-CM | POA: Diagnosis not present

## 2020-12-03 DIAGNOSIS — N186 End stage renal disease: Secondary | ICD-10-CM | POA: Diagnosis not present

## 2020-12-03 DIAGNOSIS — Z992 Dependence on renal dialysis: Secondary | ICD-10-CM | POA: Diagnosis not present

## 2020-12-03 DIAGNOSIS — D689 Coagulation defect, unspecified: Secondary | ICD-10-CM | POA: Diagnosis not present

## 2020-12-03 DIAGNOSIS — D631 Anemia in chronic kidney disease: Secondary | ICD-10-CM | POA: Diagnosis not present

## 2020-12-04 ENCOUNTER — Ambulatory Visit: Payer: Medicare Other | Admitting: Nurse Practitioner

## 2020-12-04 ENCOUNTER — Telehealth: Payer: Self-pay

## 2020-12-04 NOTE — Telephone Encounter (Signed)
This nurse called patient in regards to missed AWV. He stated that he thought it was rescheduled to November. We rescheduled for 12/25/2020.

## 2020-12-05 DIAGNOSIS — Z992 Dependence on renal dialysis: Secondary | ICD-10-CM | POA: Diagnosis not present

## 2020-12-05 DIAGNOSIS — D631 Anemia in chronic kidney disease: Secondary | ICD-10-CM | POA: Diagnosis not present

## 2020-12-05 DIAGNOSIS — N186 End stage renal disease: Secondary | ICD-10-CM | POA: Diagnosis not present

## 2020-12-05 DIAGNOSIS — N2581 Secondary hyperparathyroidism of renal origin: Secondary | ICD-10-CM | POA: Diagnosis not present

## 2020-12-08 DIAGNOSIS — D631 Anemia in chronic kidney disease: Secondary | ICD-10-CM | POA: Diagnosis not present

## 2020-12-08 DIAGNOSIS — Z992 Dependence on renal dialysis: Secondary | ICD-10-CM | POA: Diagnosis not present

## 2020-12-08 DIAGNOSIS — N2581 Secondary hyperparathyroidism of renal origin: Secondary | ICD-10-CM | POA: Diagnosis not present

## 2020-12-08 DIAGNOSIS — N186 End stage renal disease: Secondary | ICD-10-CM | POA: Diagnosis not present

## 2020-12-10 ENCOUNTER — Other Ambulatory Visit: Payer: Self-pay

## 2020-12-10 ENCOUNTER — Other Ambulatory Visit: Payer: Self-pay | Admitting: Nurse Practitioner

## 2020-12-10 DIAGNOSIS — R053 Chronic cough: Secondary | ICD-10-CM

## 2020-12-10 DIAGNOSIS — N2581 Secondary hyperparathyroidism of renal origin: Secondary | ICD-10-CM | POA: Diagnosis not present

## 2020-12-10 DIAGNOSIS — N186 End stage renal disease: Secondary | ICD-10-CM | POA: Diagnosis not present

## 2020-12-10 DIAGNOSIS — D631 Anemia in chronic kidney disease: Secondary | ICD-10-CM | POA: Diagnosis not present

## 2020-12-10 DIAGNOSIS — D689 Coagulation defect, unspecified: Secondary | ICD-10-CM | POA: Diagnosis not present

## 2020-12-10 DIAGNOSIS — Z992 Dependence on renal dialysis: Secondary | ICD-10-CM | POA: Diagnosis not present

## 2020-12-10 MED ORDER — HYDROCODONE BIT-HOMATROP MBR 5-1.5 MG/5ML PO SOLN: 5.0000 mL | Freq: Four times a day (QID) | ORAL | 0 refills | Status: DC | PRN

## 2020-12-11 DIAGNOSIS — M47892 Other spondylosis, cervical region: Secondary | ICD-10-CM | POA: Diagnosis not present

## 2020-12-11 DIAGNOSIS — M19019 Primary osteoarthritis, unspecified shoulder: Secondary | ICD-10-CM | POA: Diagnosis not present

## 2020-12-12 DIAGNOSIS — N2581 Secondary hyperparathyroidism of renal origin: Secondary | ICD-10-CM | POA: Diagnosis not present

## 2020-12-12 DIAGNOSIS — N186 End stage renal disease: Secondary | ICD-10-CM | POA: Diagnosis not present

## 2020-12-12 DIAGNOSIS — Z992 Dependence on renal dialysis: Secondary | ICD-10-CM | POA: Diagnosis not present

## 2020-12-12 DIAGNOSIS — D631 Anemia in chronic kidney disease: Secondary | ICD-10-CM | POA: Diagnosis not present

## 2020-12-15 DIAGNOSIS — D631 Anemia in chronic kidney disease: Secondary | ICD-10-CM | POA: Diagnosis not present

## 2020-12-15 DIAGNOSIS — N186 End stage renal disease: Secondary | ICD-10-CM | POA: Diagnosis not present

## 2020-12-15 DIAGNOSIS — Z992 Dependence on renal dialysis: Secondary | ICD-10-CM | POA: Diagnosis not present

## 2020-12-15 DIAGNOSIS — N2581 Secondary hyperparathyroidism of renal origin: Secondary | ICD-10-CM | POA: Diagnosis not present

## 2020-12-17 ENCOUNTER — Other Ambulatory Visit: Payer: Self-pay

## 2020-12-17 DIAGNOSIS — Z992 Dependence on renal dialysis: Secondary | ICD-10-CM | POA: Diagnosis not present

## 2020-12-17 DIAGNOSIS — N186 End stage renal disease: Secondary | ICD-10-CM | POA: Diagnosis not present

## 2020-12-17 DIAGNOSIS — N2581 Secondary hyperparathyroidism of renal origin: Secondary | ICD-10-CM | POA: Diagnosis not present

## 2020-12-17 DIAGNOSIS — R053 Chronic cough: Secondary | ICD-10-CM

## 2020-12-17 DIAGNOSIS — D689 Coagulation defect, unspecified: Secondary | ICD-10-CM | POA: Diagnosis not present

## 2020-12-17 DIAGNOSIS — D631 Anemia in chronic kidney disease: Secondary | ICD-10-CM | POA: Diagnosis not present

## 2020-12-17 MED ORDER — GABAPENTIN 100 MG PO CAPS: 100.0000 mg | ORAL_CAPSULE | Freq: Three times a day (TID) | ORAL | 1 refills | Status: DC | PRN

## 2020-12-19 DIAGNOSIS — N2581 Secondary hyperparathyroidism of renal origin: Secondary | ICD-10-CM | POA: Diagnosis not present

## 2020-12-19 DIAGNOSIS — N186 End stage renal disease: Secondary | ICD-10-CM | POA: Diagnosis not present

## 2020-12-19 DIAGNOSIS — Z992 Dependence on renal dialysis: Secondary | ICD-10-CM | POA: Diagnosis not present

## 2020-12-19 DIAGNOSIS — D631 Anemia in chronic kidney disease: Secondary | ICD-10-CM | POA: Diagnosis not present

## 2020-12-21 DIAGNOSIS — N186 End stage renal disease: Secondary | ICD-10-CM | POA: Diagnosis not present

## 2020-12-21 DIAGNOSIS — I120 Hypertensive chronic kidney disease with stage 5 chronic kidney disease or end stage renal disease: Secondary | ICD-10-CM | POA: Diagnosis not present

## 2020-12-21 DIAGNOSIS — Z992 Dependence on renal dialysis: Secondary | ICD-10-CM | POA: Diagnosis not present

## 2020-12-22 DIAGNOSIS — I120 Hypertensive chronic kidney disease with stage 5 chronic kidney disease or end stage renal disease: Secondary | ICD-10-CM | POA: Diagnosis not present

## 2020-12-22 DIAGNOSIS — N186 End stage renal disease: Secondary | ICD-10-CM | POA: Diagnosis not present

## 2020-12-22 DIAGNOSIS — N2581 Secondary hyperparathyroidism of renal origin: Secondary | ICD-10-CM | POA: Diagnosis not present

## 2020-12-22 DIAGNOSIS — Z992 Dependence on renal dialysis: Secondary | ICD-10-CM | POA: Diagnosis not present

## 2020-12-22 DIAGNOSIS — D631 Anemia in chronic kidney disease: Secondary | ICD-10-CM | POA: Diagnosis not present

## 2020-12-23 ENCOUNTER — Telehealth: Payer: Self-pay

## 2020-12-23 NOTE — Telephone Encounter (Signed)
The pt states he was returning a call, the pt was told that it was an appt reminder for his appt this Thursday at 9:30 for a medicare annual wellness visit.  The pt said he would make it to his appt.

## 2020-12-24 DIAGNOSIS — N186 End stage renal disease: Secondary | ICD-10-CM | POA: Diagnosis not present

## 2020-12-24 DIAGNOSIS — N2581 Secondary hyperparathyroidism of renal origin: Secondary | ICD-10-CM | POA: Diagnosis not present

## 2020-12-24 DIAGNOSIS — Z992 Dependence on renal dialysis: Secondary | ICD-10-CM | POA: Diagnosis not present

## 2020-12-24 DIAGNOSIS — D631 Anemia in chronic kidney disease: Secondary | ICD-10-CM | POA: Diagnosis not present

## 2020-12-24 DIAGNOSIS — I4892 Unspecified atrial flutter: Secondary | ICD-10-CM | POA: Diagnosis not present

## 2020-12-25 ENCOUNTER — Other Ambulatory Visit: Payer: Self-pay

## 2020-12-25 ENCOUNTER — Ambulatory Visit (INDEPENDENT_AMBULATORY_CARE_PROVIDER_SITE_OTHER): Payer: Medicare Other

## 2020-12-25 VITALS — BP 108/64 | HR 103 | Temp 98.6°F | Ht 73.0 in | Wt 237.4 lb

## 2020-12-25 DIAGNOSIS — Z0000 Encounter for general adult medical examination without abnormal findings: Secondary | ICD-10-CM | POA: Diagnosis not present

## 2020-12-25 NOTE — Progress Notes (Signed)
This visit occurred during the SARS-CoV-2 public health emergency.  Safety protocols were in place, including screening questions prior to the visit, additional usage of staff PPE, and extensive cleaning of exam room while observing appropriate contact time as indicated for disinfecting solutions.  Subjective:   [name] is a 60 y.o. male who presents for Medicare Annual/Subsequent preventive examination.  Review of Systems     Cardiac Risk Factors include: advanced age (>32men, >43 women);diabetes mellitus;hypertension;male gender;sedentary lifestyle     Objective:    Today's Vitals   12/25/20 0916 12/25/20 0922  BP: 108/64   Pulse: (!) 103   Temp: 98.6 F (37 C)   TempSrc: Oral   SpO2: 98%   Weight: 237 lb 6.4 oz (107.7 kg)   Height: 6\' 1"  (1.854 m)   PainSc:  10-Worst pain ever   Body mass index is 31.32 kg/m.  Advanced Directives 12/25/2020 06/05/2020 04/24/2020 11/15/2019 03/15/2019 01/18/2019 11/16/2018  Does Patient Have a Medical Advance Directive? No No No No No No No  Would patient like information on creating a medical advance directive? No - Patient declined No - Patient declined No - Patient declined No - Patient declined No - Patient declined No - Patient declined No - Patient declined  Pre-existing out of facility DNR order (yellow form or pink MOST form) - - - - - - -    Current Medications (verified) Outpatient Encounter Medications as of 12/25/2020  Medication Sig   albuterol (VENTOLIN HFA) 108 (90 Base) MCG/ACT inhaler Inhale 2 puffs into the lungs every 6 (six) hours as needed for wheezing or shortness of breath.   amiodarone (PACERONE) 200 MG tablet Take 1 tablet (200 mg total) by mouth 2 (two) times daily.   calcium acetate (PHOSLO) 667 MG capsule Take 1-3 capsules (667-2,001 mg total) by mouth See admin instructions. Take 2001 mg capsules with meals three times daily and 667 mg capsule with snacks. (Patient taking differently: Take 2,001 mg by mouth daily.)    chlorhexidine (PERIDEX) 0.12 % solution 15 mLs by Mouth Rinse route 2 (two) times daily.   cinacalcet (SENSIPAR) 90 MG tablet Take 180 mg by mouth daily.   Control Gel Formula Dressing (DUODERM CGF EXTRA THIN) MISC Apply 1 each topically daily as needed.   diclofenac Sodium (VOLTAREN) 1 % GEL Apply 2 g topically 4 (four) times daily.   digoxin (LANOXIN) 0.125 MG tablet Take 1 tablet (0.125 mg total) by mouth every Monday, Wednesday, and Friday.   ferric citrate (AURYXIA) 1 GM 210 MG(Fe) tablet Take 630 mg by mouth 3 (three) times daily with meals.   fluticasone furoate-vilanterol (BREO ELLIPTA) 100-25 MCG/INH AEPB Inhale 1 puff into the lungs 2 (two) times daily as needed (wheezing).   gabapentin (NEURONTIN) 100 MG capsule Take 1 capsule (100 mg total) by mouth 3 (three) times daily as needed (coughing uncontrollably).   HYDROcodone bit-homatropine (HYDROMET) 5-1.5 MG/5ML syrup Take 5 mLs by mouth every 6 (six) hours as needed for cough.   ipratropium (ATROVENT) 0.06 % nasal spray Place 2 sprays into both nostrils 4 (four) times daily. (Patient taking differently: Place 2 sprays into both nostrils 4 (four) times daily as needed for rhinitis.)   lidocaine-prilocaine (EMLA) cream Apply 1 application topically See admin instructions. Apply topically to port access prior to dialysis - Monday, Wednesday, Friday.   loperamide (IMODIUM A-D) 2 MG tablet Take 1 tablet (2 mg total) by mouth 4 (four) times daily as needed for diarrhea or loose stools. Also  available OTC.   metoprolol succinate (TOPROL-XL) 25 MG 24 hr tablet Take 1 tablet (25 mg total) by mouth daily.   midodrine (PROAMATINE) 10 MG tablet Take 1 tablet (10 mg total) by mouth 3 (three) times daily.   multivitamin (RENA-VIT) TABS tablet Take 1 tablet by mouth at bedtime.   nitroGLYCERIN (NITROSTAT) 0.4 MG SL tablet Place 0.4 mg under the tongue every 5 (five) minutes as needed for chest pain.    Nutritional Supplements (FEEDING SUPPLEMENT,  NEPRO CARB STEADY,) LIQD Take 237 mLs by mouth daily.   ondansetron (ZOFRAN ODT) 4 MG disintegrating tablet Take 1 tablet (4 mg total) by mouth every 8 (eight) hours as needed for nausea or vomiting.   oxyCODONE-acetaminophen (PERCOCET/ROXICET) 5-325 MG tablet Take 1 tablet by mouth every 4 (four) hours as needed for severe pain.   pantoprazole (PROTONIX) 40 MG tablet Take 40 mg by mouth 2 (two) times daily.   sucroferric oxyhydroxide (VELPHORO) 500 MG chewable tablet Chew 500-1,000 mg by mouth See admin instructions. Takes 2 tablets with each meal and 1 tablet with snacks.   warfarin (COUMADIN) 7.5 MG tablet Take 7.5 mg by mouth daily.   UNABLE TO FIND Out patient physical therapy- vestibular therapy for BPPV (Patient not taking: No sig reported)   No facility-administered encounter medications on file as of 12/25/2020.    Allergies (verified) Ace inhibitors   History: Past Medical History:  Diagnosis Date   Acute lower GI bleeding 06/21/2018   Acute respiratory failure with hypoxia (HCC) 01/13/2015   Atrial flutter (HCC)    Bile leak, postoperative 03/15/2016   Biliary dyskinesia 02/12/2016   Blind right eye    Hx: of partial blindness in right eye   Cardiomyopathy    CHF (congestive heart failure) ([place])    Coronary artery disease    normal coronaries by 10/10/08 cath, Cardiac Cath 08-04-12 epic.Dr. Doylene Canard follows   Diabetes mellitus    pt. states he's borderline diabetic., no longer taking med,- off med. since 2013   Dialysis patient [facility])    Mon-Wed-Fri([place])- Left AV fistula   Diverticulitis November 2016   reoccurred in December 2016   Diverticulitis of intestine without perforation or abscess without bleeding 04/21/2015   DVT (deep venous thrombosis) (HCC)    ESRD (end stage renal disease) ([place])    GERD (gastroesophageal reflux disease)    Gout    Hemorrhage of left kidney 01/13/2018   History of nephrectomy 07/04/2012   History of right nephrectomy in 2002  for renal cell carcinoma    History of unilateral nephrectomy    Hypertension    Low iron    Myocardial infarction [facility]) ?2006   Renal cell carcinoma    dialysis- [facility]- [address]- Dr. Mercy Moore follows.   Renal insufficiency    Shortness of breath 05/19/11   "at rest, lying down, w/exertion"   Stroke [facility]) 02/2011   05/19/11 denies residual   Umbilical hernia 52/84/13   unrepaired   Wears glasses    Past Surgical History:  Procedure Laterality Date   ANAL FISTULOTOMY  08/15/2018   AV FISTULA PLACEMENT  03/29/2011   Procedure: ARTERIOVENOUS (AV) FISTULA CREATION;  Surgeon: Hinda Lenis, MD;  Location: [place];  Service: Vascular;  Laterality: Left;  LEFT RADIOCEPHALIC , Arteriovenous [id number])   CARDIAC CATHETERIZATION  05/21/11   CARDIAC CATHETERIZATION N/A 03/16/2016   Procedure: Left Heart Cath and Coronary Angiography;  Surgeon: Dixie Dials, MD;  Location: [place] CV LAB;  Service: Cardiovascular;  Laterality: N/A;   CHOLECYSTECTOMY N/A 02/12/2016   Procedure: LAPAROSCOPIC CHOLECYSTECTOMY WITH INTRAOPERATIVE CHOLANGIOGRAM;  Surgeon: Jackolyn Confer, MD;  Location: [place];  Service: General;  Laterality: N/A;   COLONOSCOPY WITH PROPOFOL N/A 02/01/2017   Procedure: COLONOSCOPY WITH PROPOFOL;  Surgeon: Juanita Craver, MD;  Location: [facility];  Service: Endoscopy;  Laterality: N/A;   dialysis cath placed     ESOPHAGOGASTRODUODENOSCOPY (EGD) WITH PROPOFOL N/A 12/02/2015   Procedure: ESOPHAGOGASTRODUODENOSCOPY (EGD) WITH PROPOFOL;  Surgeon: Juanita Craver, MD;  [facility];  Service: Endoscopy;  Laterality: N/A;   EVALUATION UNDER ANESTHESIA WITH HEMORRHOIDECTOMY N/A 08/15/2018   Procedure: HEMORRHOID LIGATION/PEXY;  Surgeon: Michael Boston, MD;  Location: [place];  Service: General;  Laterality: N/A;   FINGER SURGERY     L pinkie finger- ORIF- /w remaining hardware - 1990's     FISTULOTOMY N/A 08/15/2018   Procedure: SUPERFICIAL ANAL FISTULOTOMY;  Surgeon: Michael Boston, MD;  Location: [place];  Service: General;  Laterality: N/A;   HEMORRHOID SURGERY     HERNIA REPAIR N/A 0/01/3817   Umbilical hernia repair   INSERTION OF DIALYSIS CATHETER N/A 01/27/2016   Procedure: INSERTION OF DIALYSIS CATHETER;  Surgeon: Waynetta Sandy, MD;  Location: [place];  Service: Vascular;  Laterality: N/A;   INSERTION OF MESH N/A 07/04/2012   Procedure: INSERTION OF MESH;  Surgeon: Madilyn Hook, DO;  Location: [place];  Service: General;  Laterality: N/A;   IR EMBO ART  VEN HEMORR LYMPH EXTRAV  INC GUIDE ROADMAPPING  01/13/2018   IR FLUORO GUIDE CV LINE RIGHT  01/13/2018   IR IVC FILTER PLMT / S&I /IMG GUID/MOD SED  01/27/2018   IR RENAL SELECTIVE  UNI INC S&I MOD SED  01/13/2018   IR US GUIDE VASC ACCESS RIGHT  01/13/2018   KIDNEY CYST REMOVAL  2019   LAPAROSCOPIC LYSIS OF ADHESIONS N/A 02/12/2016   Procedure: LAPAROSCOPIC LYSIS OF ADHESIONS;  Surgeon: Jackolyn Confer, MD;  Location: [place];  Service: General;  Laterality: N/A;   LEFT AND RIGHT HEART CATHETERIZATION WITH CORONARY ANGIOGRAM N/A 08/04/2012   Procedure: LEFT AND RIGHT HEART CATHETERIZATION WITH CORONARY ANGIOGRAM;  Surgeon: Birdie Riddle, MD;  Location: [place] CATH LAB;  Service: Cardiovascular;  Laterality: N/A;   NEPHRECTOMY  2000   right   REVISON OF ARTERIOVENOUS FISTULA Left 06/05/2013   Procedure: REVISON OF LEFT RADIOCEPHALIC  ARTERIOVENOUS FISTULA;  Surgeon: Conrad [place], MD;  Location: [place];  Service: Vascular;  Laterality: Left;   REVISON OF ARTERIOVENOUS FISTULA Left 01/27/2016   Procedure: REVISION OF LEFT UPPER EXTREMITY ARTERIOVENOUS FISTULA;  Surgeon: Waynetta Sandy, MD;  Location: [place];  Service: Vascular;  Laterality: Left;   REVISON OF ARTERIOVENOUS FISTULA Left 08/03/2016   Procedure: REVISON OF Left arm ARTERIOVENOUS FISTULA;  Surgeon: Elam Dutch, MD;  Location: [place];  Service: Vascular;  Laterality: Left;   REVISON OF ARTERIOVENOUS FISTULA Left 05/06/2017   Procedure: REVISION  PLICATION OF ARTERIOVENOUS FISTULA LEFT ARM;  Surgeon: Rosetta Posner, MD;  Location: [place];  Service: Vascular;  Laterality: Left;   REVISON OF ARTERIOVENOUS FISTULA Left 02/01/2019   Procedure: REVISION OF ARTERIOVENOUS FISTULA LEFT ARM;  Surgeon: [name], MD;  Location: [place];  Service: Vascular;  Laterality: Left;   REVISON OF ARTERIOVENOUS FISTULA Left 03/15/2019   Procedure: REVISION PLICATION OF ARTERIOVENOUS FISTULA LEFT ARM;  Surgeon: [name], MD;  Location: [place];  Service: Vascular;  Laterality: Left;   RIGHT HEART  CATHETERIZATION N/A 05/21/2011   Procedure: RIGHT HEART CATH;  Surgeon: Birdie Riddle, MD;  Location: [facility] C[facility];  Service: Cardiovascular;  Laterality: N/A;   smashed  1990's   "left pinky; have a plate in there"   UMBILICAL HERNIA REPAIR N/A 07/04/2012   Procedure: LAPAROSCOPIC UMBILICAL HERNIA;  Surgeon: Madilyn Hook, DO;  Location: [place];  Service: General;  Laterality: N/A;   US ECHOCARDIOGRAPHY  05/20/11   Family History  Problem Relation Age of Onset   Hypertension Mother    Kidney disease Mother    Social History   Socioeconomic History   Marital status: Married    Spouse name: Not on file   Number of children: Not on file   Years of education: Not on file   Highest education level: Not on file  Occupational History   Occupation: disability  Tobacco Use   Smoking status: Never   Smokeless tobacco: Never  Vaping Use   Vaping Use: Never used  Substance and Sexual Activity   Alcohol use: No    Alcohol/week: 0.0 standard drinks   Drug use: Yes    Types: Oxycodone    Comment: Past hx-none in 20 yrs   Sexual activity: Yes  Other Topics Concern   Not on file  Social History Narrative   Not on file   Social Determinants of Health   Financial Resource Strain: Low Risk    Difficulty of Paying Living Expenses: Not hard at all  Food Insecurity: No Food Insecurity   Worried About Charity fundraiser in the Last Year: Never  true   [place] in the Last Year: Never true  Transportation Needs: No Transportation Needs   Lack of Transportation (Medical): No   Lack of Transportation (Non-Medical): No  Physical Activity: Inactive   Days of Exercise per Week: 0 days   Minutes of Exercise per Session: 0 min  Stress: Stress Concern Present   Feeling of Stress : To some extent  Social Connections: Not on file    Tobacco Counseling Counseling given: Not Answered   Clinical Intake:  Pre-visit preparation completed: Yes  Pain : 0-10 Pain Score: 10-Worst pain ever Pain Type: Chronic pain Pain Location: Shoulder Pain Orientation: Right, Left Pain Descriptors / Indicators: Sharp Pain Onset: More than a month ago Pain Frequency: Constant     Nutritional Status: BMI > 30  Obese Nutritional Risks: None Diabetes: Yes  How often do you need to have someone help you when you read instructions, pamphlets, or other written materials from your doctor or pharmacy?: 1 - Never What is the last grade level you completed in school?: college  Diabetic? Yes Nutrition Risk Assessment:  Has the patient had any N/V/D within the last 2 months?  No  Does the patient have any non-healing wounds?  No  Has the patient had any unintentional weight loss or weight gain?  No   Diabetes:  Is the patient diabetic?  Yes  If diabetic, was a CBG obtained today?  No  Did the patient bring in their glucometer from home?  No  How often do you monitor your CBG's? Every other day.   Financial Strains and Diabetes Management:  Are you having any financial strains with the device, your supplies or your medication? No .  Does the patient want to be seen by Chronic Care Management for management of their diabetes?  No  Would the patient like to be referred to a Nutritionist or for Diabetic  Management?  No   Diabetic Exams:  Diabetic Eye Exam: Overdue for diabetic eye exam. Pt has been advised about the importance in completing  this exam. Patient advised to call and schedule an eye exam. Diabetic Foot Exam: Completed 09/25/2020      Information entered by :: NAllen LPN   Activities of Daily Living In your present state of health, do you have any difficulty performing the following activities: 12/25/2020 06/05/2020  Hearing? N N  Vision? Y N  Comment legally blind right eye -  Difficulty concentrating or making decisions? N N  Walking or climbing stairs? Y Y  Dressing or bathing? Y N  Doing errands, shopping? N N  Preparing Food and eating ? N -  Using the Toilet? N -  In the past six months, have you accidently leaked urine? N -  Do you have problems with loss of bowel control? N -  Managing your Medications? N -  Managing your Finances? N -  Housekeeping or managing your Housekeeping? N -  Some recent data might be hidden    Patient Care Team: [name], FNP as PCP - General (General Practice) Michael Boston, MD as Consulting Physician (General Surgery) Juanita Craver, MD as Consulting Physician (Gastroenterology) Dixie Dials, MD as Consulting Physician (Cardiology) Donato Heinz, MD as Consulting Physician (Nephrology) Mayford Knife, [facility] (Pharmacist)  Indicate any recent Medical Services you may have received from other than [facility] providers in the past year (date may be approximate).     Assessment:   This is a routine wellness examination for Christophr.  Hearing/Vision screen No results found.  Dietary issues and exercise activities discussed: Current Exercise Habits: The patient does not participate in regular exercise at present   Goals Addressed             This Visit's Progress    Patient Stated       12/25/2020, no goals       Depression Screen PHQ 2/9 Scores 12/25/2020 11/27/2020 11/15/2019 05/31/2019 01/18/2019 11/09/2018 07/18/2018  PHQ - 2 Score 0 0 0 0 0 0 0  PHQ- 9 Score - - - - - 1 -    Fall Risk Fall Risk  12/25/2020 11/15/2019 05/31/2019 01/18/2019 11/09/2018  Falls in the  past year? 0 0 0 0 1  Number falls in past yr: - - - - 0  Comment - - - - bp drop and fell  Injury with Fall? - - - - 0  Risk for fall due to : Medication side effect;Impaired vision Medication side effect - - History of fall(s);Medication side effect  Follow up Falls evaluation completed;Education provided;Falls prevention discussed Falls evaluation completed;Education provided;Falls prevention discussed - - Education provided;Falls prevention discussed    FALL RISK PREVENTION PERTAINING TO THE HOME:  Any stairs in or around the home? Yes  If so, are there any without handrails? No  Home free of loose throw rugs in walkways, pet beds, electrical cords, etc? Yes  Adequate lighting in your home to reduce risk of falls? Yes   ASSISTIVE DEVICES UTILIZED TO PREVENT FALLS:  Life alert? No  Use of a cane, walker or w/c? No  Grab bars in the bathroom? No  Shower chair or bench in shower? No  Elevated toilet seat or a handicapped toilet? Yes   TIMED UP AND GO:  Was the test performed? No .    Gait slow and steady without use of assistive device  Cognitive Function:  6CIT Screen 12/25/2020 11/15/2019 11/09/2018  What Year? 0 points 0 points 0 points  What month? 0 points 0 points 0 points  What time? 0 points 0 points 0 points  Count back from 20 2 points 0 points 0 points  Months in reverse 4 points 4 points 4 points  Repeat phrase 4 points 4 points 0 points  Total Score 10 8 4     Immunizations Immunization History  Administered Date(s) Administered   PFIZER(Purple Top)SARS-COV-2 Vaccination 08/03/2019, 08/29/2019, 02/22/2020    TDAP status: Up to date  Flu Vaccine status: Declined, Education has been provided regarding the importance of this vaccine but patient still declined. Advised may receive this vaccine at local pharmacy or Health Dept. Aware to provide a copy of the vaccination record if obtained from local pharmacy or Health Dept. Verbalized acceptance and  understanding.  Pneumococcal vaccine status: Declined,  Education has been provided regarding the importance of this vaccine but patient still declined. Advised may receive this vaccine at local pharmacy or Health Dept. Aware to provide a copy of the vaccination record if obtained from local pharmacy or Health Dept. Verbalized acceptance and understanding.   Covid-19 vaccine status: Completed vaccines  Qualifies for Shingles Vaccine? Yes   Zostavax completed No   Shingrix Completed?: No.    Education has been provided regarding the importance of this vaccine. Patient has been advised to call insurance company to determine out of pocket expense if they have not yet received this vaccine. Advised may also receive vaccine at local pharmacy or Health Dept. Verbalized acceptance and understanding.  Screening Tests Health Maintenance  Topic Date Due   OPHTHALMOLOGY EXAM  Never done   COVID-19 Vaccine (4 - Booster for Pfizer series) 06/24/2020   INFLUENZA VACCINE  12/22/2020   Zoster Vaccines- Shingrix (1 of 2) 02/27/2021 (Originally 03/27/1980)   PNEUMOCOCCAL POLYSACCHARIDE VACCINE AGE 69-64 HIGH RISK  09/25/2021 (Originally 03/27/1963)   Pneumococcal Vaccine 78-16 Years old (1 - PCV) 11/27/2021 (Originally [birth year]/27/1969)   HEMOGLOBIN A1C  05/30/2021   FOOT EXAM  09/25/2021   TETANUS/TDAP  08/11/2026   COLONOSCOPY (Pts 45-27yrs Insurance coverage will need to be confirmed)  02/02/2027   Hepatitis C Screening  Completed   HIV Screening  Completed   HPV VACCINES  Aged Out   URINE MICROALBUMIN  Discontinued    Health Maintenance  Health Maintenance Due  Topic Date Due   OPHTHALMOLOGY EXAM  Never done   COVID-19 Vaccine (4 - Booster for [place] series) 06/24/2020   INFLUENZA VACCINE  12/22/2020    Colorectal cancer screening: Type of screening: Colonoscopy. Completed 02/01/2017. Repeat every 10 years  Lung Cancer Screening: (Low Dose CT Chest recommended if Age 66-80 years, 30 pack-year currently  smoking OR have quit w/in 15years.) does not qualify.   Lung Cancer Screening Referral: no  Additional Screening:  Hepatitis C Screening: does qualify; Completed 07/18/2018  Vision Screening: Recommended annual ophthalmology exams for early detection of glaucoma and other disorders of the eye. Is the patient up to date with their annual eye exam?  No  Who is the provider or what is the name of the office in which the patient attends annual eye exams? [facility] If pt is not established with a provider, would they like to be referred to a provider to establish care? No .   Dental Screening: Recommended annual dental exams for proper oral hygiene  Community Resource Referral / Chronic Care Management: CRR required this visit?  No   CCM required  this visit?  No      Plan:     I have personally reviewed and noted the following in the patient's chart:   Medical and social history Use of alcohol, tobacco or illicit drugs  Current medications and supplements including opioid prescriptions. Patient is currently taking opioid prescriptions. Information provided to patient regarding non-opioid alternatives. Patient advised to discuss non-opioid treatment plan with their provider. Functional ability and status Nutritional status Physical activity Advanced directives List of other physicians Hospitalizations, surgeries, and ER visits in previous 12 months Vitals Screenings to include cognitive, depression, and falls Referrals and appointments  In addition, I have reviewed and discussed with patient certain preventive protocols, quality metrics, and best practice recommendations. A written personalized care plan for preventive services as well as general preventive health recommendations were provided to patient.     Kellie Simmering, LPN   07/02/2079   Nurse Notes:

## 2020-12-25 NOTE — Patient Instructions (Signed)
[name] , Thank you for taking time to come for your Medicare Wellness Visit. I appreciate your ongoing commitment to your health goals. Please review the following plan we discussed and let me know if I can assist you in the future.   Screening recommendations/referrals: Colonoscopy: completed 02/01/2017 Recommended yearly ophthalmology/optometry visit for glaucoma screening and checkup Recommended yearly dental visit for hygiene and checkup  Vaccinations: Influenza vaccine: decline Pneumococcal vaccine: decline Tdap vaccine: completed 08/10/2016, due 08/11/2026 Shingles vaccine: decline   Covid-19:  02/22/2020, 08/29/2019, 08/03/2019  Advanced directives: Advance directive discussed with you today. Even though you declined this today please call our office should you change your mind and we can give you the proper paperwork for you to fill out.  Conditions/risks identified: none  Next appointment: Follow up in one year for your annual wellness visit   Preventive Care 40-64 Years, Male Preventive care refers to lifestyle choices and visits with your health care provider that can promote health and wellness. What does preventive care include? A yearly physical exam. This is also called an annual well check. Dental exams once or twice a year. Routine eye exams. Ask your health care provider how often you should have your eyes checked. Personal lifestyle choices, including: Daily care of your teeth and gums. Regular physical activity. Eating a healthy diet. Avoiding tobacco and drug use. Limiting alcohol use. Practicing safe sex. Taking low-dose aspirin every day starting at age 37. What happens during an annual well check? The services and screenings done by your health care provider during your annual well check will depend on your age, overall health, lifestyle risk factors, and family history of disease. Counseling  Your health care provider may ask you questions about  your: Alcohol use. Tobacco use. Drug use. Emotional well-being. Home and relationship well-being. Sexual activity. Eating habits. Work and work Statistician. Screening  You may have the following tests or measurements: Height, weight, and BMI. Blood pressure. Lipid and cholesterol levels. These may be checked every 5 years, or more frequently if you are over 79 years old. Skin check. Lung cancer screening. You may have this screening every year starting at age 67 if you have a 30-pack-year history of smoking and currently smoke or have quit within the past 15 years. Fecal occult blood test (FOBT) of the stool. You may have this test every year starting at age 18. Flexible sigmoidoscopy or colonoscopy. You may have a sigmoidoscopy every 5 years or a colonoscopy every 10 years starting at age 42. Prostate cancer screening. Recommendations will vary depending on your family history and other risks. Hepatitis C blood test. Hepatitis B blood test. Sexually transmitted disease (STD) testing. Diabetes screening. This is done by checking your blood sugar (glucose) after you have not eaten for a while (fasting). You may have this done every 1-3 years. Discuss your test results, treatment options, and if necessary, the need for more tests with your health care provider. Vaccines  Your health care provider may recommend certain vaccines, such as: Influenza vaccine. This is recommended every year. Tetanus, diphtheria, and acellular pertussis (Tdap, Td) vaccine. You may need a Td booster every 10 years. Zoster vaccine. You may need this after age 53. Pneumococcal 13-valent conjugate (PCV13) vaccine. You may need this if you have certain conditions and have not been vaccinated. Pneumococcal polysaccharide (PPSV23) vaccine. You may need one or two doses if you smoke cigarettes or if you have certain conditions. Talk to your health care provider about which screenings  and vaccines you need and how  often you need them. This information is not intended to replace advice given to you by your health care provider. Make sure you discuss any questions you have with your health care provider. Document Released: 06/06/2015 Document Revised: 01/28/2016 Document Reviewed: 03/11/2015 Elsevier Interactive Patient Education  2017 [place] Prevention in the Home Falls can cause injuries. They can happen to people of all ages. There are many things you can do to make your home safe and to help prevent falls. What can I do on the outside of my home? Regularly fix the edges of walkways and driveways and fix any cracks. Remove anything that might make you trip as you walk through a door, such as a raised step or threshold. Trim any bushes or trees on the path to your home. Use bright outdoor lighting. Clear any walking paths of anything that might make someone trip, such as rocks or tools. Regularly check to see if handrails are loose or broken. Make sure that both sides of any steps have handrails. Any raised decks and porches should have guardrails on the edges. Have any leaves, snow, or ice cleared regularly. Use sand or salt on walking paths during winter. Clean up any spills in your garage right away. This includes oil or grease spills. What can I do in the bathroom? Use night lights. Install grab bars by the toilet and in the tub and shower. Do not use towel bars as grab bars. Use non-skid mats or decals in the tub or shower. If you need to sit down in the shower, use a plastic, non-slip stool. Keep the floor dry. Clean up any water that spills on the floor as soon as it happens. Remove soap buildup in the tub or shower regularly. Attach bath mats securely with double-sided non-slip rug tape. Do not have throw rugs and other things on the floor that can make you trip. What can I do in the bedroom? Use night lights. Make sure that you have a light by your bed that is easy to  reach. Do not use any sheets or blankets that are too big for your bed. They should not hang down onto the floor. Have a firm chair that has side arms. You can use this for support while you get dressed. Do not have throw rugs and other things on the floor that can make you trip. What can I do in the kitchen? Clean up any spills right away. Avoid walking on wet floors. Keep items that you use a lot in easy-to-reach places. If you need to reach something above you, use a strong step stool that has a grab bar. Keep electrical cords out of the way. Do not use floor polish or wax that makes floors slippery. If you must use wax, use non-skid floor wax. Do not have throw rugs and other things on the floor that can make you trip. What can I do with my stairs? Do not leave any items on the stairs. Make sure that there are handrails on both sides of the stairs and use them. Fix handrails that are broken or loose. Make sure that handrails are as long as the stairways. Check any carpeting to make sure that it is firmly attached to the stairs. Fix any carpet that is loose or worn. Avoid having throw rugs at the top or bottom of the stairs. If you do have throw rugs, attach them to the floor with carpet tape.  Make sure that you have a light switch at the top of the stairs and the bottom of the stairs. If you do not have them, ask someone to add them for you. What else can I do to help prevent falls? Wear shoes that: Do not have high heels. Have rubber bottoms. Are comfortable and fit you well. Are closed at the toe. Do not wear sandals. If you use a stepladder: Make sure that it is fully opened. Do not climb a closed stepladder. Make sure that both sides of the stepladder are locked into place. Ask someone to hold it for you, if possible. Clearly mark and make sure that you can see: Any grab bars or handrails. First and last steps. Where the edge of each step is. Use tools that help you move  around (mobility aids) if they are needed. These include: Canes. Walkers. Scooters. Crutches. Turn on the lights when you go into a dark area. Replace any light bulbs as soon as they burn out. Set up your furniture so you have a clear path. Avoid moving your furniture around. If any of your floors are uneven, fix them. If there are any pets around you, be aware of where they are. Review your medicines with your doctor. Some medicines can make you feel dizzy. This can increase your chance of falling. Ask your doctor what other things that you can do to help prevent falls. This information is not intended to replace advice given to you by your health care provider. Make sure you discuss any questions you have with your health care provider. Document Released: 03/06/2009 Document Revised: 10/16/2015 Document Reviewed: 06/14/2014 Elsevier Interactive Patient Education  2017 [employer].

## 2020-12-26 DIAGNOSIS — N186 End stage renal disease: Secondary | ICD-10-CM | POA: Diagnosis not present

## 2020-12-26 DIAGNOSIS — D631 Anemia in chronic kidney disease: Secondary | ICD-10-CM | POA: Diagnosis not present

## 2020-12-26 DIAGNOSIS — Z992 Dependence on renal dialysis: Secondary | ICD-10-CM | POA: Diagnosis not present

## 2020-12-26 DIAGNOSIS — N2581 Secondary hyperparathyroidism of renal origin: Secondary | ICD-10-CM | POA: Diagnosis not present

## 2020-12-29 DIAGNOSIS — N186 End stage renal disease: Secondary | ICD-10-CM | POA: Diagnosis not present

## 2020-12-29 DIAGNOSIS — D631 Anemia in chronic kidney disease: Secondary | ICD-10-CM | POA: Diagnosis not present

## 2020-12-29 DIAGNOSIS — Z992 Dependence on renal dialysis: Secondary | ICD-10-CM | POA: Diagnosis not present

## 2020-12-29 DIAGNOSIS — N2581 Secondary hyperparathyroidism of renal origin: Secondary | ICD-10-CM | POA: Diagnosis not present

## 2020-12-31 DIAGNOSIS — N186 End stage renal disease: Secondary | ICD-10-CM | POA: Diagnosis not present

## 2020-12-31 DIAGNOSIS — N2581 Secondary hyperparathyroidism of renal origin: Secondary | ICD-10-CM | POA: Diagnosis not present

## 2020-12-31 DIAGNOSIS — D689 Coagulation defect, unspecified: Secondary | ICD-10-CM | POA: Diagnosis not present

## 2020-12-31 DIAGNOSIS — Z992 Dependence on renal dialysis: Secondary | ICD-10-CM | POA: Diagnosis not present

## 2020-12-31 DIAGNOSIS — D631 Anemia in chronic kidney disease: Secondary | ICD-10-CM | POA: Diagnosis not present

## 2021-01-01 ENCOUNTER — Telehealth: Payer: Medicare Other

## 2021-01-02 DIAGNOSIS — N186 End stage renal disease: Secondary | ICD-10-CM | POA: Diagnosis not present

## 2021-01-02 DIAGNOSIS — Z992 Dependence on renal dialysis: Secondary | ICD-10-CM | POA: Diagnosis not present

## 2021-01-02 DIAGNOSIS — D631 Anemia in chronic kidney disease: Secondary | ICD-10-CM | POA: Diagnosis not present

## 2021-01-02 DIAGNOSIS — N2581 Secondary hyperparathyroidism of renal origin: Secondary | ICD-10-CM | POA: Diagnosis not present

## 2021-01-05 DIAGNOSIS — D631 Anemia in chronic kidney disease: Secondary | ICD-10-CM | POA: Diagnosis not present

## 2021-01-05 DIAGNOSIS — Z992 Dependence on renal dialysis: Secondary | ICD-10-CM | POA: Diagnosis not present

## 2021-01-05 DIAGNOSIS — N186 End stage renal disease: Secondary | ICD-10-CM | POA: Diagnosis not present

## 2021-01-05 DIAGNOSIS — N2581 Secondary hyperparathyroidism of renal origin: Secondary | ICD-10-CM | POA: Diagnosis not present

## 2021-01-06 ENCOUNTER — Other Ambulatory Visit: Payer: Self-pay

## 2021-01-06 ENCOUNTER — Ambulatory Visit (INDEPENDENT_AMBULATORY_CARE_PROVIDER_SITE_OTHER): Payer: Medicare Other | Admitting: Podiatry

## 2021-01-06 ENCOUNTER — Encounter: Payer: Self-pay | Admitting: Podiatry

## 2021-01-06 ENCOUNTER — Encounter (HOSPITAL_COMMUNITY): Payer: Self-pay | Admitting: *Deleted

## 2021-01-06 DIAGNOSIS — M19019 Primary osteoarthritis, unspecified shoulder: Secondary | ICD-10-CM | POA: Diagnosis not present

## 2021-01-06 DIAGNOSIS — N186 End stage renal disease: Secondary | ICD-10-CM

## 2021-01-06 DIAGNOSIS — Z7901 Long term (current) use of anticoagulants: Secondary | ICD-10-CM

## 2021-01-06 DIAGNOSIS — E119 Type 2 diabetes mellitus without complications: Secondary | ICD-10-CM | POA: Diagnosis not present

## 2021-01-06 DIAGNOSIS — B351 Tinea unguium: Secondary | ICD-10-CM | POA: Diagnosis not present

## 2021-01-06 DIAGNOSIS — M79674 Pain in right toe(s): Secondary | ICD-10-CM | POA: Diagnosis not present

## 2021-01-06 DIAGNOSIS — M79675 Pain in left toe(s): Secondary | ICD-10-CM

## 2021-01-07 DIAGNOSIS — N2581 Secondary hyperparathyroidism of renal origin: Secondary | ICD-10-CM | POA: Diagnosis not present

## 2021-01-07 DIAGNOSIS — Z992 Dependence on renal dialysis: Secondary | ICD-10-CM | POA: Diagnosis not present

## 2021-01-07 DIAGNOSIS — N186 End stage renal disease: Secondary | ICD-10-CM | POA: Diagnosis not present

## 2021-01-07 DIAGNOSIS — D631 Anemia in chronic kidney disease: Secondary | ICD-10-CM | POA: Diagnosis not present

## 2021-01-07 DIAGNOSIS — D689 Coagulation defect, unspecified: Secondary | ICD-10-CM | POA: Diagnosis not present

## 2021-01-07 NOTE — Progress Notes (Signed)
  Subjective:  Patient ID: [name], male    DOB: 18-Apr-1961,  MRN: [id number]  Chief Complaint  Patient presents with   Nail Problem      (np) right foot pinky toe discolored    60 y.o. male presents with the above complaint. History confirmed with patient.  All other toes are thickened elongated and has difficulty cutting them.  He has type 2 diabetes he is no longer on medication and is diet controlled.  This is improved after hemodialysis.  Objective:  Physical Exam: warm, good capillary refill, no trophic changes or ulcerative lesions, normal DP and PT pulses, and normal sensory exam. Left Foot: dystrophic yellowed discolored nail plates with subungual debris Right Foot: dystrophic yellowed discolored nail plates with subungual debris  Assessment:   1. Pain due to onychomycosis of toenails of both feet   2. Diet-controlled type 2 diabetes mellitus ([place])   3. Chronic anticoagulation   4. End stage renal disease ([place])      Plan:  Patient was evaluated and treated and all questions answered.  Patient educated on diabetes. Discussed proper diabetic foot care and discussed risks and complications of disease. Educated patient in depth on reasons to return to the office immediately should he/she discover anything concerning or new on the feet. All questions answered. Discussed proper shoes as well.   Discussed the etiology and treatment options for the condition in detail with the patient. Educated patient on the topical and oral treatment options for mycotic nails. Recommended debridement of the nails today. Sharp and mechanical debridement performed of all painful and mycotic nails today. Nails debrided in length and thickness using a nail nipper to level of comfort. Discussed treatment options including appropriate shoe gear. Follow up as needed for painful nails.    Return in about 3 months (around 04/08/2021) for at risk diabetic foot care.

## 2021-01-08 DIAGNOSIS — N186 End stage renal disease: Secondary | ICD-10-CM | POA: Diagnosis not present

## 2021-01-08 DIAGNOSIS — I2510 Atherosclerotic heart disease of native coronary artery without angina pectoris: Secondary | ICD-10-CM | POA: Diagnosis not present

## 2021-01-08 DIAGNOSIS — I5022 Chronic systolic (congestive) heart failure: Secondary | ICD-10-CM | POA: Diagnosis not present

## 2021-01-08 DIAGNOSIS — I340 Nonrheumatic mitral (valve) insufficiency: Secondary | ICD-10-CM | POA: Diagnosis not present

## 2021-01-08 DIAGNOSIS — I120 Hypertensive chronic kidney disease with stage 5 chronic kidney disease or end stage renal disease: Secondary | ICD-10-CM | POA: Diagnosis not present

## 2021-01-08 DIAGNOSIS — Z7901 Long term (current) use of anticoagulants: Secondary | ICD-10-CM | POA: Diagnosis not present

## 2021-01-09 DIAGNOSIS — N2581 Secondary hyperparathyroidism of renal origin: Secondary | ICD-10-CM | POA: Diagnosis not present

## 2021-01-09 DIAGNOSIS — Z992 Dependence on renal dialysis: Secondary | ICD-10-CM | POA: Diagnosis not present

## 2021-01-09 DIAGNOSIS — N186 End stage renal disease: Secondary | ICD-10-CM | POA: Diagnosis not present

## 2021-01-09 DIAGNOSIS — D631 Anemia in chronic kidney disease: Secondary | ICD-10-CM | POA: Diagnosis not present

## 2021-01-12 DIAGNOSIS — D631 Anemia in chronic kidney disease: Secondary | ICD-10-CM | POA: Diagnosis not present

## 2021-01-12 DIAGNOSIS — Z992 Dependence on renal dialysis: Secondary | ICD-10-CM | POA: Diagnosis not present

## 2021-01-12 DIAGNOSIS — N186 End stage renal disease: Secondary | ICD-10-CM | POA: Diagnosis not present

## 2021-01-12 DIAGNOSIS — N2581 Secondary hyperparathyroidism of renal origin: Secondary | ICD-10-CM | POA: Diagnosis not present

## 2021-01-14 DIAGNOSIS — D689 Coagulation defect, unspecified: Secondary | ICD-10-CM | POA: Diagnosis not present

## 2021-01-14 DIAGNOSIS — D631 Anemia in chronic kidney disease: Secondary | ICD-10-CM | POA: Diagnosis not present

## 2021-01-14 DIAGNOSIS — Z992 Dependence on renal dialysis: Secondary | ICD-10-CM | POA: Diagnosis not present

## 2021-01-14 DIAGNOSIS — N2581 Secondary hyperparathyroidism of renal origin: Secondary | ICD-10-CM | POA: Diagnosis not present

## 2021-01-14 DIAGNOSIS — N186 End stage renal disease: Secondary | ICD-10-CM | POA: Diagnosis not present

## 2021-01-16 DIAGNOSIS — Z992 Dependence on renal dialysis: Secondary | ICD-10-CM | POA: Diagnosis not present

## 2021-01-16 DIAGNOSIS — N2581 Secondary hyperparathyroidism of renal origin: Secondary | ICD-10-CM | POA: Diagnosis not present

## 2021-01-16 DIAGNOSIS — D631 Anemia in chronic kidney disease: Secondary | ICD-10-CM | POA: Diagnosis not present

## 2021-01-16 DIAGNOSIS — N186 End stage renal disease: Secondary | ICD-10-CM | POA: Diagnosis not present

## 2021-01-19 DIAGNOSIS — D631 Anemia in chronic kidney disease: Secondary | ICD-10-CM | POA: Diagnosis not present

## 2021-01-19 DIAGNOSIS — N2581 Secondary hyperparathyroidism of renal origin: Secondary | ICD-10-CM | POA: Diagnosis not present

## 2021-01-19 DIAGNOSIS — N186 End stage renal disease: Secondary | ICD-10-CM | POA: Diagnosis not present

## 2021-01-19 DIAGNOSIS — Z992 Dependence on renal dialysis: Secondary | ICD-10-CM | POA: Diagnosis not present

## 2021-01-20 DIAGNOSIS — M19019 Primary osteoarthritis, unspecified shoulder: Secondary | ICD-10-CM | POA: Diagnosis not present

## 2021-01-21 DIAGNOSIS — Z992 Dependence on renal dialysis: Secondary | ICD-10-CM | POA: Diagnosis not present

## 2021-01-21 DIAGNOSIS — D689 Coagulation defect, unspecified: Secondary | ICD-10-CM | POA: Diagnosis not present

## 2021-01-21 DIAGNOSIS — N186 End stage renal disease: Secondary | ICD-10-CM | POA: Diagnosis not present

## 2021-01-21 DIAGNOSIS — N2581 Secondary hyperparathyroidism of renal origin: Secondary | ICD-10-CM | POA: Diagnosis not present

## 2021-01-21 DIAGNOSIS — D631 Anemia in chronic kidney disease: Secondary | ICD-10-CM | POA: Diagnosis not present

## 2021-01-22 DIAGNOSIS — Z992 Dependence on renal dialysis: Secondary | ICD-10-CM | POA: Diagnosis not present

## 2021-01-22 DIAGNOSIS — N186 End stage renal disease: Secondary | ICD-10-CM | POA: Diagnosis not present

## 2021-01-22 DIAGNOSIS — I120 Hypertensive chronic kidney disease with stage 5 chronic kidney disease or end stage renal disease: Secondary | ICD-10-CM | POA: Diagnosis not present

## 2021-01-23 DIAGNOSIS — K5730 Diverticulosis of large intestine without perforation or abscess without bleeding: Secondary | ICD-10-CM | POA: Diagnosis not present

## 2021-01-23 DIAGNOSIS — D631 Anemia in chronic kidney disease: Secondary | ICD-10-CM | POA: Diagnosis not present

## 2021-01-23 DIAGNOSIS — N2581 Secondary hyperparathyroidism of renal origin: Secondary | ICD-10-CM | POA: Diagnosis not present

## 2021-01-23 DIAGNOSIS — R918 Other nonspecific abnormal finding of lung field: Secondary | ICD-10-CM | POA: Diagnosis not present

## 2021-01-23 DIAGNOSIS — D509 Iron deficiency anemia, unspecified: Secondary | ICD-10-CM | POA: Diagnosis not present

## 2021-01-23 DIAGNOSIS — N186 End stage renal disease: Secondary | ICD-10-CM | POA: Diagnosis not present

## 2021-01-23 DIAGNOSIS — Z20822 Contact with and (suspected) exposure to covid-19: Secondary | ICD-10-CM | POA: Diagnosis not present

## 2021-01-23 DIAGNOSIS — I959 Hypotension, unspecified: Secondary | ICD-10-CM | POA: Diagnosis not present

## 2021-01-23 DIAGNOSIS — N261 Atrophy of kidney (terminal): Secondary | ICD-10-CM | POA: Diagnosis not present

## 2021-01-23 DIAGNOSIS — K5732 Diverticulitis of large intestine without perforation or abscess without bleeding: Secondary | ICD-10-CM | POA: Diagnosis not present

## 2021-01-23 DIAGNOSIS — Z992 Dependence on renal dialysis: Secondary | ICD-10-CM | POA: Diagnosis not present

## 2021-01-23 DIAGNOSIS — K6389 Other specified diseases of intestine: Secondary | ICD-10-CM | POA: Diagnosis not present

## 2021-01-23 DIAGNOSIS — I517 Cardiomegaly: Secondary | ICD-10-CM | POA: Diagnosis not present

## 2021-01-23 DIAGNOSIS — R1110 Vomiting, unspecified: Secondary | ICD-10-CM | POA: Diagnosis not present

## 2021-01-23 DIAGNOSIS — R1031 Right lower quadrant pain: Secondary | ICD-10-CM | POA: Diagnosis not present

## 2021-01-24 DIAGNOSIS — N186 End stage renal disease: Secondary | ICD-10-CM | POA: Diagnosis not present

## 2021-01-24 DIAGNOSIS — D631 Anemia in chronic kidney disease: Secondary | ICD-10-CM | POA: Diagnosis not present

## 2021-01-24 DIAGNOSIS — N2581 Secondary hyperparathyroidism of renal origin: Secondary | ICD-10-CM | POA: Diagnosis not present

## 2021-01-24 DIAGNOSIS — D509 Iron deficiency anemia, unspecified: Secondary | ICD-10-CM | POA: Diagnosis not present

## 2021-01-24 DIAGNOSIS — Z992 Dependence on renal dialysis: Secondary | ICD-10-CM | POA: Diagnosis not present

## 2021-01-26 DIAGNOSIS — D631 Anemia in chronic kidney disease: Secondary | ICD-10-CM | POA: Diagnosis not present

## 2021-01-26 DIAGNOSIS — D509 Iron deficiency anemia, unspecified: Secondary | ICD-10-CM | POA: Diagnosis not present

## 2021-01-26 DIAGNOSIS — Z992 Dependence on renal dialysis: Secondary | ICD-10-CM | POA: Diagnosis not present

## 2021-01-26 DIAGNOSIS — N186 End stage renal disease: Secondary | ICD-10-CM | POA: Diagnosis not present

## 2021-01-26 DIAGNOSIS — N2581 Secondary hyperparathyroidism of renal origin: Secondary | ICD-10-CM | POA: Diagnosis not present

## 2021-01-27 ENCOUNTER — Telehealth: Payer: Self-pay

## 2021-01-27 NOTE — Telephone Encounter (Signed)
The pt was notified that his application for his handicap placcard is ready for pickup,

## 2021-01-27 NOTE — Telephone Encounter (Signed)
Transition Care Management Follow-up Telephone Call Date of discharge and from where: 01/23/21 [facility] How have you been since you were released from the hospital? ok Any questions or concerns? No  Items Reviewed: Did the pt receive and understand the discharge instructions provided? Yes  Medications obtained and verified? Yes  Other? No  Any new allergies since your discharge? No  Dietary orders reviewed? No Do you have support at home? Yes   Home Care and Equipment/Supplies: Were home health services ordered? no If so, what is the name of the agency? na  Has the agency set up a time to come to the patient's home? not applicable Were any new equipment or medical supplies ordered?  No What is the name of the medical supply agency? na Were you able to get the supplies/equipment? not applicable Do you have any questions related to the use of the equipment or supplies? No  Functional Questionnaire: (I = Independent and D = Dependent) ADLs: i  Bathing/Dressing- d  Meal Prep- d  Eating- i  Maintaining continence- i  Transferring/Ambulation- i  Managing Meds- i  Follow up appointments reviewed:  PCP Hospital f/u appt confirmed? Yes  Scheduled to see [facility] on 02/03/21  Specialist Hospital f/u appt confirmed? Yes  Scheduled to see Dr Collene Mares. Are transportation arrangements needed? No  If their condition worsens, is the pt aware to call PCP or go to the Emergency Dept.? Yes Was the patient provided with contact information for the PCP's office or ED? Yes Was to pt encouraged to call back with questions or concerns? Yes

## 2021-01-27 NOTE — Chronic Care Management (AMB) (Signed)
Chronic Care Management Pharmacy Assistant   Name: [name]  MRN: [id number] DOB: May 07, 1961   Reason for Encounter: Chart review for CPP visit on 01-29-2021   Conditions to be addressed/monitored: CHF, HTN, DMII, and ESRD   Recent office visits:  12-25-2020 Kellie Simmering, LPN. Medicare wellness.  11-27-2020 Bary Castilla, NP. Follow up  09-25-2020 [name], FNP. A1C= 5.8. START Duoderm gel dressing apply 1 daily as needed. STOP Tessalon, Musinex and Hydromet.  Recent consult visits:  01-06-2021 Criselda Peaches, DPM (Podiatry). Debridement of nails. Follow up in 3 months.  11-19-2020 [name], MD (Nephrology). Dialysis  11-17-2020 [name], MD (Nephrology). Dialysis  11-14-2020 [name], MD (Nephrology). Dialysis  11-12-2020 [name], MD (Nephrology). Dialysis  11-11-2020 [name], MD (Nephrology). Dialysis  11-10-2020 [name], MD (Nephrology). Dialysis  11-07-2020 [name], MD (Nephrology). Dialysis  11-05-2020 [name], MD (Nephrology). Dialysis  11-03-2020 [name], MD (Nephrology). Dialysis  10-31-2020 [name], MD (Nephrology). Dialysis  10-29-2020 [name], MD (Nephrology). Dialysis  10-27-2020 [name], MD (Nephrology). Dialysis  10-24-2020 [name], MD (Nephrology). Dialysis  10-22-2020 Claudia Desanctis, MD (Nephrology). Follow up  10-20-2020 [name], MD (Nephrology). Dialysis  10-17-2020 [name], MD (Nephrology). Dialysis  10-15-2020 [name], MD (Nephrology). Dialysis  10-13-2020 [name], MD (Nephrology). Dialysis  10-10-2020 [name], MD (Nephrology). Dialysis  10-08-2020 [name], MD (Nephrology). Dialysis  10-06-2020 [name], MD (Nephrology). Dialysis  10-03-2020 [name], MD (Nephrology). Dialysis  10-01-2020 [name], MD (Nephrology).  Dialysis  09-29-2020 [name], MD (Nephrology). Dialysis  09-26-2020 [name], MD (Nephrology). Dialysis  09-25-2020 [name], MD (Nephrology). Dialysis  09-24-2020 [name], MD (Nephrology). Dialysis  09-22-2020 [name], MD (Nephrology). Dialysis  09-21-2020 [name], MD (Nephrology). Dialysis  09-19-2020 [name], MD (Nephrology). Dialysis  09-17-2020 [name], MD (Nephrology). Dialysis  09-15-2020 [name], MD (Nephrology). Dialysis  09-11-2020 [name], MD (Nephrology). Dialysis  09-10-2020 [name], MD (Nephrology). Dialysis  09-08-2020 [name], MD (Nephrology). Dialysis  09-05-2020 [name], MD (Nephrology). Dialysis  09-03-2020 [name], MD (Nephrology). Dialysis  09-01-2020 [name], MD (Nephrology). Dialysis  08-29-2020 [name], MD (Nephrology). Dialysis  08-27-2020 [name], MD (Nephrology). Dialysis  08-25-2020 [name], MD (Nephrology). Dialysis  08-22-2020 Donato Heinz, MD (Nephrology). Follow up  08-20-2020 [name], MD (Nephrology). Dialysis  08-18-2020 [name], MD (Nephrology). Dialysis  08-15-2020 [name], MD (Nephrology). Dialysis  08-13-2020 [name], MD (Nephrology). Dialysis  08-11-2020 [name], MD (Nephrology). Dialysis  08-08-2020 [name], MD (Nephrology). Dialysis  08-07-2020 Dixie Dials, MD (Nephrology). Follow up.  08-06-2020 [name], MD (Nephrology). Dialysis  08-04-2020 [name], MD (Nephrology). Dialysis  08-01-2020 [name], MD (Nephrology). Dialysis  07-30-2020 [name], MD (Nephrology). Dialysis  07-28-2020 [name], MD (Nephrology). Dialysis  Hospital visits:  None in previous 6 months  Medications: Outpatient Encounter Medications as of 01/27/2021  Medication Sig    albuterol (VENTOLIN HFA) 108 (90 Base) MCG/ACT inhaler Inhale 2 puffs into the lungs every 6 (six) hours as needed for wheezing or shortness of breath.   amiodarone (PACERONE) 200 MG tablet Take 1 tablet (200 mg total) by mouth 2 (two) times daily.   calcium acetate (PHOSLO) 667 MG capsule Take 1-3 capsules (667-2,001 mg total) by mouth See admin instructions. Take 2001 mg capsules with meals three times daily and 667 mg capsule with snacks. (Patient taking differently: Take 2,001 mg by mouth daily.)   chlorhexidine (PERIDEX) 0.12 %  solution 15 mLs by Mouth Rinse route 2 (two) times daily.   cinacalcet (SENSIPAR) 90 MG tablet Take 180 mg by mouth daily. (Patient not taking: Reported on 01/06/2021)   Control Gel Formula Dressing (DUODERM CGF EXTRA THIN) MISC Apply 1 each topically daily as needed.   diclofenac Sodium (VOLTAREN) 1 % GEL Apply 2 g topically 4 (four) times daily.   digoxin (LANOXIN) 0.125 MG tablet Take 1 tablet (0.125 mg total) by mouth every Monday, Wednesday, and Friday.   ferric citrate (AURYXIA) 1 GM 210 MG(Fe) tablet Take 630 mg by mouth 3 (three) times daily with meals.   fluticasone furoate-vilanterol (BREO ELLIPTA) 100-25 MCG/INH AEPB Inhale 1 puff into the lungs 2 (two) times daily as needed (wheezing).   gabapentin (NEURONTIN) 100 MG capsule Take 1 capsule (100 mg total) by mouth 3 (three) times daily as needed (coughing uncontrollably).   HYDROcodone bit-homatropine (HYDROMET) 5-1.5 MG/5ML syrup Take 5 mLs by mouth every 6 (six) hours as needed for cough.   ipratropium (ATROVENT) 0.06 % nasal spray Place 2 sprays into both nostrils 4 (four) times daily. (Patient taking differently: Place 2 sprays into both nostrils 4 (four) times daily as needed for rhinitis.)   lidocaine-prilocaine (EMLA) cream Apply 1 application topically See admin instructions. Apply topically to port access prior to dialysis - Monday, Wednesday, Friday.   loperamide (IMODIUM A-D) 2 MG tablet Take 1 tablet  (2 mg total) by mouth 4 (four) times daily as needed for diarrhea or loose stools. Also available OTC.   metoprolol succinate (TOPROL-XL) 25 MG 24 hr tablet Take 1 tablet (25 mg total) by mouth daily.   metoprolol tartrate (LOPRESSOR) 25 MG tablet Take 25 mg by mouth 2 (two) times daily.   midodrine (PROAMATINE) 10 MG tablet Take 1 tablet (10 mg total) by mouth 3 (three) times daily.   multivitamin (RENA-VIT) TABS tablet Take 1 tablet by mouth at bedtime.   nitroGLYCERIN (NITROSTAT) 0.4 MG SL tablet Place 0.4 mg under the tongue every 5 (five) minutes as needed for chest pain.    Nutritional Supplements (FEEDING SUPPLEMENT, NEPRO CARB STEADY,) LIQD Take 237 mLs by mouth daily.   ondansetron (ZOFRAN ODT) 4 MG disintegrating tablet Take 1 tablet (4 mg total) by mouth every 8 (eight) hours as needed for nausea or vomiting.   oxyCODONE-acetaminophen (PERCOCET/ROXICET) 5-325 MG tablet Take 1 tablet by mouth every 4 (four) hours as needed for severe pain.   pantoprazole (PROTONIX) 40 MG tablet Take 40 mg by mouth 2 (two) times daily.   sucroferric oxyhydroxide (VELPHORO) 500 MG chewable tablet Chew 500-1,000 mg by mouth See admin instructions. Takes 2 tablets with each meal and 1 tablet with snacks.   traMADol (ULTRAM) 50 MG tablet Take by mouth.   UNABLE TO FIND Out patient physical therapy- vestibular therapy for BPPV (Patient not taking: No sig reported)   warfarin (COUMADIN) 7.5 MG tablet Take 7.5 mg by mouth daily.   No facility-administered encounter medications on file as of 01/27/2021.   Have you seen any other providers since your last visit? Patient stated on 01-23-2021 he went to [facility] for 5 hours for diverticulitis and was prescribed zofran and amoxicillin. Patient stated on 12-20-2020 to [facility] for follow up on steriod shot in left shoulder few weeks prior.    Any changes in your medications or health? Patient stated no  Any side effects from any medications?  Patient stated no  Do you have an symptoms or problems not managed  by your medications? Patient stated no  Any concerns about your health right now? Patient stated he feels his health isn't getting better.  Has your provider asked that you check blood pressure, blood sugar, or follow special diet at home? Patient states he checks blood sugar 1-2 times weekly and checks blood pressure every other day.  Do you get any type of exercise on a regular basis? Patient states he walks most days out of the week.  Can you think of a goal you would like to reach for your health? Patient states he would like to be healthy.  Do you have any problems getting your medications? Patient stated no  Is there anything that you would like to discuss during the appointment? Patient stated getting healthier  Please bring medications and supplements to appointment   Care Gaps: Yearly Ophthalmology exam overdue Covid booster overdue Last medicare wellness 12-25-2020  Star Rating Drugs: None  Jeannette How [facility] Clinical Pharmacist Assistant [phone]

## 2021-01-28 DIAGNOSIS — Z992 Dependence on renal dialysis: Secondary | ICD-10-CM | POA: Diagnosis not present

## 2021-01-28 DIAGNOSIS — I4892 Unspecified atrial flutter: Secondary | ICD-10-CM | POA: Diagnosis not present

## 2021-01-28 DIAGNOSIS — N186 End stage renal disease: Secondary | ICD-10-CM | POA: Diagnosis not present

## 2021-01-28 DIAGNOSIS — N2581 Secondary hyperparathyroidism of renal origin: Secondary | ICD-10-CM | POA: Diagnosis not present

## 2021-01-28 DIAGNOSIS — D509 Iron deficiency anemia, unspecified: Secondary | ICD-10-CM | POA: Diagnosis not present

## 2021-01-28 DIAGNOSIS — D631 Anemia in chronic kidney disease: Secondary | ICD-10-CM | POA: Diagnosis not present

## 2021-01-29 ENCOUNTER — Ambulatory Visit (INDEPENDENT_AMBULATORY_CARE_PROVIDER_SITE_OTHER): Payer: Medicare Other

## 2021-01-29 DIAGNOSIS — I10 Essential (primary) hypertension: Secondary | ICD-10-CM

## 2021-01-29 DIAGNOSIS — K219 Gastro-esophageal reflux disease without esophagitis: Secondary | ICD-10-CM

## 2021-01-29 NOTE — Progress Notes (Signed)
Chronic Care Management Pharmacy Note  02/11/2021 Name:  [name] MRN:  [id number] DOB:  02-Apr-1961  Summary: Patient reports taking a lot of medications and having to keep up with them can be challenging.   Recommendations/Changes made from today's visit: Recommended pill packaging system for patient to keep up with medication and increase adherence.   Plan: Patient to start using packaging pharmacy. Patient to consider COVID -19 vaccination booster.    Subjective: [name] is an 60 y.o. year old male who is a primary patient of [name], FNP.  The CCM team was consulted for assistance with disease management and care coordination needs.    Engaged with patient by telephone for initial visit in response to provider referral for pharmacy case management and/or care coordination services. Patient has 8 brothers and sisters. He went to [employer] and played basketball where he was averaging 34 points a game. He has two girls, and one grand baby one on the way. He dated his wife for 33 years and has been married for 10 years. He [place] [employer] His grand daughter competes in cheer leading and they go from city to city.   Consent to Services:  The patient was given the following information about Chronic Care Management services today, agreed to services, and gave verbal consent: 1. CCM service includes personalized support from designated clinical staff supervised by the primary care provider, including individualized plan of care and coordination with other care providers 2. 24/7 contact phone numbers for assistance for urgent and routine care needs. 3. Service will only be billed when office clinical staff spend 20 minutes or more in a month to coordinate care. 4. Only one practitioner may furnish and bill the service in a calendar month. 5.The patient may stop CCM services at any time (effective at the end of the month) by phone call to the office  staff. 6. The patient will be responsible for cost sharing (co-pay) of up to 20% of the service fee (after annual deductible is met). Patient agreed to services and consent obtained.  Patient Care Team: [name], FNP as PCP - General (General Practice) Karie Soda, MD as Consulting Physician (General Surgery) Charna Elizabeth, MD as Consulting Physician (Gastroenterology) Orpah Cobb, MD as Consulting Physician (Cardiology) Terrial Rhodes, MD as Consulting Physician (Nephrology) Harlan Stains, [facility] (Pharmacist)  Recent office visits:  12-25-2020 Barb Merino, LPN. Medicare wellness.   11-27-2020 Charlesetta Ivory, NP. Follow up   09-25-2020 [name], FNP. A1C= 5.8. START Duoderm gel dressing apply 1 daily as needed. STOP Tessalon, Musinex and Hydromet.   Recent consult visits:  01-06-2021 Edwin Cap, DPM (Podiatry). Debridement of nails. Follow up in 3 months.   11-19-2020 Bufford Buttner, MD (Nephrology). Dialysis   11-17-2020 Bufford Buttner, MD (Nephrology). Dialysis   11-14-2020 Bufford Buttner, MD (Nephrology). Dialysis   11-12-2020 Bufford Buttner, MD (Nephrology). Dialysis   11-11-2020 Bufford Buttner, MD (Nephrology). Dialysis   11-10-2020 Bufford Buttner, MD (Nephrology). Dialysis   11-07-2020 Bufford Buttner, MD (Nephrology). Dialysis   11-05-2020 Bufford Buttner, MD (Nephrology). Dialysis   11-03-2020 Bufford Buttner, MD (Nephrology). Dialysis   10-31-2020 Bufford Buttner, MD (Nephrology). Dialysis   10-29-2020 Bufford Buttner, MD (Nephrology). Dialysis   10-27-2020 Bufford Buttner, MD (Nephrology). Dialysis   10-24-2020 Bufford Buttner, MD (Nephrology). Dialysis   10-22-2020 Estanislado Emms, MD (Nephrology). Follow up   10-20-2020 Bufford Buttner, MD (Nephrology). Dialysis   10-17-2020 Bufford Buttner, MD (Nephrology). Dialysis  10-15-2020 [name], MD (Nephrology). Dialysis   10-13-2020 [name], MD (Nephrology). Dialysis   10-10-2020 [name], MD (Nephrology). Dialysis   10-08-2020 [name], MD (Nephrology). Dialysis   10-06-2020 [name], MD (Nephrology). Dialysis   10-03-2020 [name], MD (Nephrology). Dialysis   10-01-2020 [name], MD (Nephrology). Dialysis   09-29-2020 [name], MD (Nephrology). Dialysis   09-26-2020 [name], MD (Nephrology). Dialysis   09-25-2020 [name], MD (Nephrology). Dialysis   09-24-2020 [name], MD (Nephrology). Dialysis   09-22-2020 [name], MD (Nephrology). Dialysis   09-21-2020 [name], MD (Nephrology). Dialysis   09-19-2020 [name], MD (Nephrology). Dialysis   09-17-2020 [name], MD (Nephrology). Dialysis   09-15-2020 [name], MD (Nephrology). Dialysis   09-11-2020 [name], MD (Nephrology). Dialysis   09-10-2020 [name], MD (Nephrology). Dialysis   09-08-2020 [name], MD (Nephrology). Dialysis   09-05-2020 [name], MD (Nephrology). Dialysis   09-03-2020 [name], MD (Nephrology). Dialysis   09-01-2020 [name], MD (Nephrology). Dialysis   08-29-2020 [name], MD (Nephrology). Dialysis   08-27-2020 [name], MD (Nephrology). Dialysis   08-25-2020 [name], MD (Nephrology). Dialysis   08-22-2020 Donato Heinz, MD (Nephrology). Follow up   08-20-2020 [name], MD (Nephrology). Dialysis   08-18-2020 [name], MD (Nephrology). Dialysis   08-15-2020 [name], MD (Nephrology). Dialysis   08-13-2020 [name], MD (Nephrology). Dialysis   08-11-2020 [name], MD (Nephrology). Dialysis   08-08-2020 [name], MD (Nephrology). Dialysis   08-07-2020 Dixie Dials, MD (Nephrology). Follow up.   08-06-2020 [name], MD (Nephrology). Dialysis   08-04-2020 [name], MD (Nephrology). Dialysis   08-01-2020 [name], MD (Nephrology). Dialysis   07-30-2020 [name], MD (Nephrology). Dialysis   07-28-2020 [name], MD (Nephrology). Dialysis  Recent ED Visit: 01/23/2021 Acute Diverticulitis  Objective:  Lab Results  Component Value Date   CREATININE 7.15 (H) 06/07/2020   BUN 23 (H) 06/07/2020   GFRNONAA 8 (L) 06/07/2020   GFRAA 6 (L) 11/15/2019   NA 135 06/07/2020   K 4.3 06/07/2020   CALCIUM 10.3 08/13/2020   CO2 25 06/07/2020   GLUCOSE 87 06/07/2020    Lab Results  Component Value Date/Time   HGBA1C 5.3 11/27/2020 08:56 AM   HGBA1C 5.8 (H) 09/25/2020 04:35 PM    Last diabetic Eye exam: No results found for: HMDIABEYEEXA  Last diabetic Foot exam: No results found for: HMDIABFOOTEX   Lab Results  Component Value Date   CHOL 181 09/25/2020   HDL 57 09/25/2020   LDLCALC 99 09/25/2020   TRIG 141 09/25/2020   CHOLHDL 3.2 09/25/2020    Hepatic Function Latest Ref Rng & Units 06/06/2020 06/05/2020 04/25/2020  Total Protein 6.5 - 8.1 g/dL - 7.9 -  Albumin 3.5 - 5.0 g/dL 3.1(L) 3.3(L) 3.4(L)  AST 15 - 41 U/L - 10(L) -  ALT 0 - 44 U/L - 9 -  Alk Phosphatase 38 - 126 U/L - 104 -  Total Bilirubin 0.3 - 1.2 mg/dL - 0.8 -  Bilirubin, Direct 0.0 - 0.2 mg/dL - <0.1 -    Lab Results  Component Value Date/Time   TSH 1.591 06/05/2020 11:11 AM   TSH 1.270 06/20/2018 11:28 AM   TSH 0.768 06/27/2015 04:19 PM   TSH 2.034 05/19/2011 05:46 PM    CBC Latest Ref Rng & Units 08/13/2020 06/09/2020 06/08/2020  WBC 4.0 - 10.5 K/uL - 6.4 6.6  Hemoglobin 13.5 - 17.5 10.9(A) 10.7(L) 10.7(L)  Hematocrit 39.0 -  52.0 % - 33.5(L) 32.6(L)  Platelets 150 - 400 K/uL - 190 196    No results found for: VD25OH  Clinical ASCVD: Yes  The ASCVD Risk score (Arnett DK, et al., 2019) failed to calculate for the following reasons:   The patient has a prior MI or stroke diagnosis    Depression screen [facility] 2/9 12/25/2020 11/27/2020  11/15/2019  Decreased Interest 0 0 0  Down, Depressed, Hopeless 0 0 0  PHQ - 2 Score 0 0 0  Altered sleeping - - -  Tired, decreased energy - - -  Change in appetite - - -  Feeling bad or failure about yourself  - - -  Trouble concentrating - - -  Moving slowly or fidgety/restless - - -  Suicidal thoughts - - -  PHQ-9 Score - - -  Some recent data might be hidden      Social History   Tobacco Use  Smoking Status Never  Smokeless Tobacco Never   BP Readings from Last 3 Encounters:  12/25/20 108/64  11/27/20 108/62  09/25/20 112/68   Pulse Readings from Last 3 Encounters:  12/25/20 (!) 103  11/27/20 94  09/25/20 (!) 104   Wt Readings from Last 3 Encounters:  12/25/20 237 lb 6.4 oz (107.7 kg)  11/27/20 238 lb 6.4 oz (108.1 kg)  09/25/20 236 lb (107 kg)   BMI Readings from Last 3 Encounters:  12/25/20 31.32 kg/m  11/27/20 31.45 kg/m  09/25/20 31.14 kg/m    Assessment/Interventions: Review of patient past medical history, allergies, medications, health status, including review of consultants reports, laboratory and other test data, was performed as part of comprehensive evaluation and provision of chronic care management services.   SDOH:  (Social Determinants of Health) assessments and interventions performed: Yes  SDOH Screenings   Alcohol Screen: Not on file  Depression (PHQ2-9): Low Risk    PHQ-2 Score: 0  Financial Resource Strain: Low Risk    Difficulty of Paying Living Expenses: Not hard at all  Food Insecurity: No Food Insecurity   Worried About Charity fundraiser in the Last Year: Never true   Ran Out of Food in the Last Year: Never true  Housing: Not on file  Physical Activity: Inactive   Days of Exercise per Week: 0 days   Minutes of Exercise per Session: 0 min  Social Connections: Not on file  Stress: Stress Concern Present   Feeling of Stress : To some extent  Tobacco Use: Low Risk    Smoking Tobacco Use: Never   Smokeless Tobacco Use:  Never  Transportation Needs: No Transportation Needs   Lack of Transportation (Medical): No   Lack of Transportation (Non-Medical): No    CCM Care Plan  Allergies  Allergen Reactions   Ace Inhibitors Itching, Cough and Other (See Comments)    Medications Reviewed Today     Reviewed by Mayford Knife, [facility] (Pharmacist) on 01/29/21 at 0926  Med List Status: <None>   Medication Order Taking? Sig Documenting Provider Last Dose Status Informant  albuterol (VENTOLIN HFA) 108 (90 Base) MCG/ACT inhaler [id number] Yes Inhale 2 puffs into the lungs every 6 (six) hours as needed for wheezing or shortness of breath. [name], FNP Taking Active Multiple Informants  amiodarone (PACERONE) 200 MG tablet [id number] Yes Take 1 tablet (200 mg total) by mouth 2 (two) times daily. Rai, Vernelle Emerald, MD Taking Active   calcium acetate (PHOSLO) 667 MG capsule [id number] Yes Take 1-3 capsules (667-2,001 mg total)  by mouth See admin instructions. Take 2001 mg capsules with meals three times daily and 667 mg capsule with snacks.  Patient taking differently: Take 2,001 mg by mouth daily.   Dixie Dials, MD Taking Active   chlorhexidine (PERIDEX) 0.12 % solution [id number] Yes 15 mLs by Mouth Rinse route 2 (two) times daily. [provider] Taking Active Multiple Informants  cinacalcet (SENSIPAR) 90 MG tablet [id number] Yes Take 180 mg by mouth daily. [provider] Taking Active   Control Gel Formula Dressing (DUODERM CGF EXTRA THIN) [place] [id number] Yes Apply 1 each topically daily as needed. [name], FNP Taking Active   diclofenac Sodium (VOLTAREN) 1 % GEL [id number] Yes Apply 2 g topically 4 (four) times daily. [name], FNP Taking Active Multiple Informants  digoxin (LANOXIN) 0.125 MG tablet [id number] Yes Take 1 tablet (0.125 mg total) by mouth every Monday, Wednesday, and Friday. Rai, Vernelle Emerald, MD Taking Active   ferric citrate (AURYXIA) 1 GM 210 MG(Fe) tablet [id number] Yes Take  630 mg by mouth 3 (three) times daily with meals. [provider] Taking Active Multiple Informants  fluticasone furoate-vilanterol (BREO ELLIPTA) 100-25 MCG/INH AEPB [id number] Yes Inhale 1 puff into the lungs 2 (two) times daily as needed (wheezing). [name], FNP Taking Active   gabapentin (NEURONTIN) 100 MG capsule [id number] Yes Take 1 capsule (100 mg total) by mouth 3 (three) times daily as needed (coughing uncontrollably). Bary Castilla, NP Taking Active   HYDROcodone bit-homatropine (HYDROMET) 5-1.5 MG/5ML syrup [id number] Yes Take 5 mLs by mouth every 6 (six) hours as needed for cough. [name], FNP Taking Active   ipratropium (ATROVENT) 0.06 % nasal spray [id number] Yes Place 2 sprays into both nostrils 4 (four) times daily.  Patient taking differently: Place 2 sprays into both nostrils 4 (four) times daily as needed for rhinitis.   [name], FNP Taking Active   lidocaine-prilocaine (EMLA) cream [id number] Yes Apply 1 application topically See admin instructions. Apply topically to port access prior to dialysis - Monday, Wednesday, Friday. [provider] Taking Active Multiple Informants           Med Note Orvan Seen, Sharlette Dense   Fri Jun 27, 2015  4:02 PM)    loperamide (IMODIUM A-D) 2 MG tablet [id number] Yes Take 1 tablet (2 mg total) by mouth 4 (four) times daily as needed for diarrhea or loose stools. Also available OTC. Rai, Vernelle Emerald, MD Taking Active   metoprolol succinate (TOPROL-XL) 25 MG 24 hr tablet [id number] Yes Take 1 tablet (25 mg total) by mouth daily. Rai, Vernelle Emerald, MD Taking Active   metoprolol tartrate (LOPRESSOR) 25 MG tablet [id number] No Take 25 mg by mouth 2 (two) times daily.  Patient not taking: Reported on 01/29/2021   [provider] Not Taking Active   midodrine (PROAMATINE) 10 MG tablet [id number] Yes Take 1 tablet (10 mg total) by mouth 3 (three) times daily. Rai, Vernelle Emerald, MD Taking Active   multivitamin (RENA-VIT) TABS  tablet [id number] Yes Take 1 tablet by mouth at bedtime. [provider] Taking Active Multiple Informants           Med Note Lenor Derrick Jul 31, 2018  8:39 AM)    nitroGLYCERIN (NITROSTAT) 0.4 MG SL tablet [id number] Yes Place 0.4 mg under the tongue every 5 (five) minutes as needed for chest pain.  [provider] Taking Active Multiple Informants           Med Note (RAYLE, MELISSA B  Fri Mar 09, 2019 12:47 PM)    Nutritional Supplements (FEEDING Megan Salon, NEPRO CARB STEADY,) LIQD [id number] Yes Take 237 mLs by mouth daily. [provider] Taking Active Multiple Informants  ondansetron (ZOFRAN ODT) 4 MG disintegrating tablet [id number] Yes Take 1 tablet (4 mg total) by mouth every 8 (eight) hours as needed for nausea or vomiting. Rai, Vernelle Emerald, MD Taking Active   oxyCODONE-acetaminophen (PERCOCET/ROXICET) 5-325 MG tablet [id number] Yes Take 1 tablet by mouth every 4 (four) hours as needed for severe pain. [provider] Taking Active Multiple Informants  pantoprazole (PROTONIX) 40 MG tablet [id number] Yes Take 40 mg by mouth 2 (two) times daily. [provider] Taking Active Multiple Informants  sucroferric oxyhydroxide (VELPHORO) 500 MG chewable tablet [id number] Yes Chew 500-1,000 mg by mouth See admin instructions. Takes 2 tablets with each meal and 1 tablet with snacks. [provider] Taking Active Multiple Informants  traMADol (ULTRAM) 50 MG tablet [id number] No Take by mouth.  Patient not taking: Reported on 01/29/2021   [provider] Not Taking Active   UNABLE TO FIND [id number]  Out patient physical therapy- vestibular therapy for [facility] Debbe Odea, MD  Active   warfarin (COUMADIN) 7.5 MG tablet [id number] Yes Take 7.5 mg by mouth daily. [provider] Taking Active Multiple Informants           Med Note Pricilla Holm Jan 29, 2021  9:26 AM) Taking 1 and 1/2 tablets once per day at 6 PM. Does INR  drawing at dialysis and give results to the heart doctor on Friday, once per week.   Med List Note Payton Doughty, CPhT 06/27/15 1553): Dialysis Monday, Wednesday, Friday, [facility], [place], [place]            Patient Active Problem List   Diagnosis Date Noted   Infectious gastroenteritis and colitis, unspecified 06/10/2020   Hypotension due to hypovolemia 06/04/2020   Allergy, unspecified, subsequent encounter 01/31/2020   Anaphylactic shock, unspecified, subsequent encounter 01/31/2020   Hypotension of hemodialysis 01/18/2019   Dizziness 11/22/2018   Hypertensive heart and CKD, ESRD on dialysis, w CHF ([place]) 11/09/2018   Fistula, anal 08/15/2018   Supratherapeutic INR 06/20/2018   Melena 06/20/2018   Malnutrition of moderate degree 02/10/2018   Anemia 01/27/2018   Paroxysmal atrial fibrillation ([place]) 01/26/2018   Depression 01/26/2018   GERD (gastroesophageal reflux disease) 01/26/2018   Pulmonary embolism ([place]) 01/26/2018   Abnormal coagulation profile 01/24/2018   Cyst of kidney, acquired 01/24/2018   Other specified disorders of kidney and ureter 01/24/2018   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   Acute on chronic combined systolic and diastolic CHF, NYHA class 4 ([place]) 07/17/2017   Dependence on renal dialysis ([place]) 07/15/2017   Hypotension 03/16/2017   Paroxysmal atrial flutter ([place]) 03/16/2017   Chronic pain 03/16/2017   Leg pain, bilateral 03/16/2017   Fluid overload, unspecified 05/26/2016   Elevated troponin 03/15/2016   Rib pain on right side    Encounter for removal of sutures 01/13/2016   Atrial flutter with rapid ventricular response ([place]) 06/27/2015   Right upper quadrant abdominal pain 01/13/2015   Diverticulitis large intestine w/o perforation or abscess w/o bleeding 12/18/2014   Hepatic cyst 12/18/2014   Diarrhea, unspecified 09/05/2014   Fever, unspecified 09/05/2014   Pruritus, unspecified 09/05/2014   Shortness of breath 09/05/2014    Hyperparathyroidism, unspecified ([place]) 85/27/7824   Complication of vascular dialysis catheter 05/16/2013   Cerebral infarction due to unspecified occlusion or stenosis  of unspecified cerebral artery ([place]) 10/09/2012   Muscle weakness (generalized) 10/09/2012   Right sided weakness 10/05/2012   Disorder of phosphorus metabolism, unspecified 09/20/2012   Hypercalcemia 09/20/2012   Coagulation defect, unspecified ([place]) 07/08/2012   History of nephrectomy 19/14/7829   Umbilical hernia with obstruction, without gangrene 07/04/2012   Other specified complication of vascular prosthetic devices, implants and grafts, subsequent encounter 03/07/2012   Other cardiomyopathies ([place]) 02/14/2012   Body mass index (BMI) 38.0-38.9, adult 12/08/2011   End-stage renal disease on hemodialysis ([place]) 06/04/2011   Acquired absence of kidney 06/03/2011   Alcohol abuse, uncomplicated 56/21/3086   Allergy status to other drugs, medicaments and biological substances 06/03/2011   Anemia in chronic kidney disease 06/03/2011   Atherosclerotic heart disease of native coronary artery without angina pectoris 06/03/2011   Hyperlipidemia, unspecified 06/03/2011   Hypertensive chronic kidney disease with stage 5 chronic kidney disease or end stage renal disease ([place]) 06/03/2011   Iron deficiency anemia, unspecified 06/03/2011   Ischemic cardiomyopathy 06/03/2011   Old myocardial infarction 06/03/2011   Personal history of other malignant neoplasm of kidney 06/03/2011   Personal history of transient ischemic attack (TIA), and cerebral infarction without residual deficits 06/03/2011   Secondary hyperparathyroidism of renal origin ([place]) 06/03/2011   Chronic combined systolic (congestive) and diastolic (congestive) heart failure ([place]) 05/19/2011    Class: Acute   Hypertension, benign 05/19/2011    Class: Chronic   DM II (diabetes mellitus, type II), controlled ([place]) 05/19/2011    Class: Chronic   Gout, chronic 05/19/2011     Immunization History  Administered Date(s) Administered   PFIZER(Purple Top)SARS-COV-2 Vaccination 08/03/2019, 08/29/2019, 02/22/2020    Conditions to be addressed/monitored:  Hypertension and GERD  Care Plan : [place]  Updates made by Mayford Knife, [place] since 02/11/2021 12:00 AM     Problem: HTN, GERD      Long-Range Goal: Disease Management   Note:   Current Barriers:  Unable to independently monitor therapeutic efficacy  Pharmacist Clinical Goal(s):  Patient will achieve adherence to monitoring guidelines and medication adherence to achieve therapeutic efficacy through collaboration with PharmD and provider.   Interventions: 1:1 collaboration with [name], FNP regarding development and update of comprehensive plan of care as evidenced by provider attestation and co-signature Inter-disciplinary care team collaboration (see longitudinal plan of care) Comprehensive medication review performed; medication list updated in electronic medical record  Hypertension (BP goal <130/80) -Controlled -Current treatment: Metoprolol Succinate 25 mg tablet once per day  Amiodarone 200 mg tablet twice per day Digoxin 0.125 mg tablet on Monday, Wednesday and Friday Midodrine 10 mg tablet three times daily Patient is taking to keep BP at normal range  -Medications previously tried: Carvedilol 25 mg tablet, Hydralazine 50 mg tablet, Isosorbide Dinitrate, Valsartn 320 mg  -Current home readings: 90/50-60 -Per cardiologist it is okay for his BP to be around 90-100/60-70  -Current dietary habits: patient reports that he eats sometimes, other days he does not want to eat anything, he tries to eat a lot of baked food. -Current exercise habits: walking every other day, sometimes he tries to walk every  other day, he walks for at least 10 minutes and get some fresh air.  -Denies hypotensive/hypertensive symptoms -Educated on Daily salt intake goal < 2300 mg; Symptoms of  hypotension and importance of maintaining adequate hydration; -Counseled to monitor BP at home everyday, document, and provide log at future appointments -Recommended to continue current medication  GERD (Goal: reduce signs and  symptoms of acid reflux) -Controlled -Current treatment  Pantoprazole 20 mg tablet twice per day    -Patient reports having a flare up of diverticulitis -Recommended to continue current medication   Health Maintenance -Vaccine gaps: COVID-19 Vaccine Booster, Influenza vaccine  Patient Goals/Self-Care Activities Patient will:  - take medications as prescribed  Follow Up Plan: The patient has been provided with contact information for the care management team and has been advised to call with any health related questions or concerns.       Medication Assistance: None required.  Patient affirms current coverage meets needs.  Compliance/Adherence/Medication fill history: Care Gaps: COVID-19 Booster 4th dose Influenza Vaccine Opthamology Exam   Star-Rating Drugs: None noted   Patient's preferred pharmacy is:  [id number] [address], [place] [address] Phone: [phone] Fax: [phone]  Uses pill box? Yes Pt endorses 90% compliance  We discussed: Verbal consent obtained for [facility] enhanced pharmacy services (medication synchronization, adherence packaging, delivery coordination). A medication sync plan was created to allow patient to get all medications delivered once every 30 to 90 days per patient preference. Patient understands they have freedom to choose pharmacy and clinical pharmacist will coordinate care between all prescribers and [facility].   Patient decided to: Utilize [facility] for medication synchronization, packaging and delivery  Care Plan and Follow Up Patient Decision:  Patient agrees to Care Plan and Follow-up.  Plan: The patient has been  provided with contact information for the care management team and has been advised to call with any health related questions or concerns.   [name], PharmD Clinical Pharmacist [facility] [phone]

## 2021-01-30 DIAGNOSIS — N2581 Secondary hyperparathyroidism of renal origin: Secondary | ICD-10-CM | POA: Diagnosis not present

## 2021-01-30 DIAGNOSIS — D631 Anemia in chronic kidney disease: Secondary | ICD-10-CM | POA: Diagnosis not present

## 2021-01-30 DIAGNOSIS — N186 End stage renal disease: Secondary | ICD-10-CM | POA: Diagnosis not present

## 2021-01-30 DIAGNOSIS — Z992 Dependence on renal dialysis: Secondary | ICD-10-CM | POA: Diagnosis not present

## 2021-01-30 DIAGNOSIS — D509 Iron deficiency anemia, unspecified: Secondary | ICD-10-CM | POA: Diagnosis not present

## 2021-02-02 DIAGNOSIS — Z992 Dependence on renal dialysis: Secondary | ICD-10-CM | POA: Diagnosis not present

## 2021-02-02 DIAGNOSIS — D631 Anemia in chronic kidney disease: Secondary | ICD-10-CM | POA: Diagnosis not present

## 2021-02-02 DIAGNOSIS — D509 Iron deficiency anemia, unspecified: Secondary | ICD-10-CM | POA: Diagnosis not present

## 2021-02-02 DIAGNOSIS — N186 End stage renal disease: Secondary | ICD-10-CM | POA: Diagnosis not present

## 2021-02-02 DIAGNOSIS — N2581 Secondary hyperparathyroidism of renal origin: Secondary | ICD-10-CM | POA: Diagnosis not present

## 2021-02-03 DIAGNOSIS — E669 Obesity, unspecified: Secondary | ICD-10-CM | POA: Diagnosis not present

## 2021-02-03 DIAGNOSIS — K760 Fatty (change of) liver, not elsewhere classified: Secondary | ICD-10-CM | POA: Diagnosis not present

## 2021-02-03 DIAGNOSIS — K5732 Diverticulitis of large intestine without perforation or abscess without bleeding: Secondary | ICD-10-CM | POA: Diagnosis not present

## 2021-02-03 DIAGNOSIS — R933 Abnormal findings on diagnostic imaging of other parts of digestive tract: Secondary | ICD-10-CM | POA: Diagnosis not present

## 2021-02-03 DIAGNOSIS — K219 Gastro-esophageal reflux disease without esophagitis: Secondary | ICD-10-CM | POA: Diagnosis not present

## 2021-02-04 DIAGNOSIS — D631 Anemia in chronic kidney disease: Secondary | ICD-10-CM | POA: Diagnosis not present

## 2021-02-04 DIAGNOSIS — D509 Iron deficiency anemia, unspecified: Secondary | ICD-10-CM | POA: Diagnosis not present

## 2021-02-04 DIAGNOSIS — N186 End stage renal disease: Secondary | ICD-10-CM | POA: Diagnosis not present

## 2021-02-04 DIAGNOSIS — Z992 Dependence on renal dialysis: Secondary | ICD-10-CM | POA: Diagnosis not present

## 2021-02-04 DIAGNOSIS — N2581 Secondary hyperparathyroidism of renal origin: Secondary | ICD-10-CM | POA: Diagnosis not present

## 2021-02-05 ENCOUNTER — Ambulatory Visit: Payer: Medicare Other | Admitting: Nurse Practitioner

## 2021-02-06 ENCOUNTER — Telehealth: Payer: Self-pay

## 2021-02-06 DIAGNOSIS — N186 End stage renal disease: Secondary | ICD-10-CM | POA: Diagnosis not present

## 2021-02-06 DIAGNOSIS — D631 Anemia in chronic kidney disease: Secondary | ICD-10-CM | POA: Diagnosis not present

## 2021-02-06 DIAGNOSIS — D509 Iron deficiency anemia, unspecified: Secondary | ICD-10-CM | POA: Diagnosis not present

## 2021-02-06 DIAGNOSIS — N2581 Secondary hyperparathyroidism of renal origin: Secondary | ICD-10-CM | POA: Diagnosis not present

## 2021-02-06 DIAGNOSIS — Z992 Dependence on renal dialysis: Secondary | ICD-10-CM | POA: Diagnosis not present

## 2021-02-06 NOTE — Chronic Care Management (AMB) (Signed)
Chronic Care Management Pharmacy Assistant   Name: [name]  MRN: [id number] DOB: [birth year]  Reason for Encounter:    02/06/2021- Spoke with patient regarding [facility] services and apologized for not calling sooner, patient aware we will need to review medications, patient would like a call back next week Tuesday.  02/10/2021- Before calling patient to review medications, called [facility] to see what prescription plan patient uses due to having Medicare only, per technician patient's medication prescription plan is with [facility]. Called patient to review medications and discuss cost option with [facility] vs [facility], no answer, voicemail not set up. Will try patient again .   Medications: Outpatient Encounter Medications as of 02/06/2021  Medication Sig Note   albuterol (VENTOLIN HFA) 108 (90 Base) MCG/ACT inhaler Inhale 2 puffs into the lungs every 6 (six) hours as needed for wheezing or shortness of breath.    amiodarone (PACERONE) 200 MG tablet Take 1 tablet (200 mg total) by mouth 2 (two) times daily.    calcium acetate (PHOSLO) 667 MG capsule Take 1-3 capsules (667-2,001 mg total) by mouth See admin instructions. Take 2001 mg capsules with meals three times daily and 667 mg capsule with snacks. (Patient taking differently: Take 2,001 mg by mouth daily.)    chlorhexidine (PERIDEX) 0.12 % solution 15 mLs by Mouth Rinse route 2 (two) times daily.    cinacalcet (SENSIPAR) 90 MG tablet Take 180 mg by mouth daily.    Control Gel Formula Dressing (DUODERM CGF EXTRA THIN) MISC Apply 1 each topically daily as needed.    diclofenac Sodium (VOLTAREN) 1 % GEL Apply 2 g topically 4 (four) times daily.    digoxin (LANOXIN) 0.125 MG tablet Take 1 tablet (0.125 mg total) by mouth every Monday, Wednesday, and Friday.    ferric citrate (AURYXIA) 1 GM 210 MG(Fe) tablet Take 630 mg by mouth 3 (three) times daily with meals.    fluticasone furoate-vilanterol (BREO ELLIPTA) 100-25 MCG/INH  AEPB Inhale 1 puff into the lungs 2 (two) times daily as needed (wheezing).    gabapentin (NEURONTIN) 100 MG capsule Take 1 capsule (100 mg total) by mouth 3 (three) times daily as needed (coughing uncontrollably).    HYDROcodone bit-homatropine (HYDROMET) 5-1.5 MG/5ML syrup Take 5 mLs by mouth every 6 (six) hours as needed for cough.    ipratropium (ATROVENT) 0.06 % nasal spray Place 2 sprays into both nostrils 4 (four) times daily. (Patient taking differently: Place 2 sprays into both nostrils 4 (four) times daily as needed for rhinitis.)    Lactobacillus (ACIDOPHILUS/BIFIDUS PO) Take 1 capsule by mouth daily. Written by Dr. Collene Mares to take once per day.    lidocaine-prilocaine (EMLA) cream Apply 1 application topically See admin instructions. Apply topically to port access prior to dialysis - Monday, Wednesday, Friday.    loperamide (IMODIUM A-D) 2 MG tablet Take 1 tablet (2 mg total) by mouth 4 (four) times daily as needed for diarrhea or loose stools. Also available OTC.    metoprolol succinate (TOPROL-XL) 25 MG 24 hr tablet Take 1 tablet (25 mg total) by mouth daily.    metoprolol tartrate (LOPRESSOR) 25 MG tablet Take 25 mg by mouth 2 (two) times daily. (Patient not taking: Reported on 01/29/2021)    midodrine (PROAMATINE) 10 MG tablet Take 1 tablet (10 mg total) by mouth 3 (three) times daily.    multivitamin (RENA-VIT) TABS tablet Take 1 tablet by mouth at bedtime.    nitroGLYCERIN (NITROSTAT) 0.4 MG SL tablet Place 0.4  mg under the tongue every 5 (five) minutes as needed for chest pain.     Nutritional Supplements (FEEDING SUPPLEMENT, NEPRO CARB STEADY,) LIQD Take 237 mLs by mouth daily.    ondansetron (ZOFRAN ODT) 4 MG disintegrating tablet Take 1 tablet (4 mg total) by mouth every 8 (eight) hours as needed for nausea or vomiting.    oxyCODONE-acetaminophen (PERCOCET/ROXICET) 5-325 MG tablet Take 1 tablet by mouth every 4 (four) hours as needed for severe pain.    pantoprazole (PROTONIX) 40 MG  tablet Take 40 mg by mouth 2 (two) times daily.    sucroferric oxyhydroxide (VELPHORO) 500 MG chewable tablet Chew 500-1,000 mg by mouth See admin instructions. Takes 2 tablets with each meal and 1 tablet with snacks.    traMADol (ULTRAM) 50 MG tablet Take by mouth. (Patient not taking: Reported on 01/29/2021)    UNABLE TO FIND Out patient physical therapy- vestibular therapy for BPPV    warfarin (COUMADIN) 7.5 MG tablet Take 7.5 mg by mouth daily. 01/29/2021: Taking 1 and 1/2 tablets once per day at 6 PM. Does INR drawing at dialysis and give results to the heart doctor on Friday, once per week.    No facility-administered encounter medications on file as of 02/06/2021.   Pattricia Boss, [place] Pharmacist Assistant [phone]

## 2021-02-09 DIAGNOSIS — D509 Iron deficiency anemia, unspecified: Secondary | ICD-10-CM | POA: Diagnosis not present

## 2021-02-09 DIAGNOSIS — Z992 Dependence on renal dialysis: Secondary | ICD-10-CM | POA: Diagnosis not present

## 2021-02-09 DIAGNOSIS — N2581 Secondary hyperparathyroidism of renal origin: Secondary | ICD-10-CM | POA: Diagnosis not present

## 2021-02-09 DIAGNOSIS — D631 Anemia in chronic kidney disease: Secondary | ICD-10-CM | POA: Diagnosis not present

## 2021-02-09 DIAGNOSIS — N186 End stage renal disease: Secondary | ICD-10-CM | POA: Diagnosis not present

## 2021-02-11 DIAGNOSIS — Z992 Dependence on renal dialysis: Secondary | ICD-10-CM | POA: Diagnosis not present

## 2021-02-11 DIAGNOSIS — N2581 Secondary hyperparathyroidism of renal origin: Secondary | ICD-10-CM | POA: Diagnosis not present

## 2021-02-11 DIAGNOSIS — D631 Anemia in chronic kidney disease: Secondary | ICD-10-CM | POA: Diagnosis not present

## 2021-02-11 DIAGNOSIS — D509 Iron deficiency anemia, unspecified: Secondary | ICD-10-CM | POA: Diagnosis not present

## 2021-02-11 DIAGNOSIS — N186 End stage renal disease: Secondary | ICD-10-CM | POA: Diagnosis not present

## 2021-02-11 NOTE — Patient Instructions (Signed)
Visit Information It was great speaking with you today!  Please let me know if you have any questions about our visit.   Goals Addressed             This Visit's Progress    Manage My Medicine       Timeframe:  Long-Range Goal Priority:  High Start Date:                             Expected End Date:                       Follow Up Date 03/10/2021    - call for medicine refill 2 or 3 days before it runs out - call if I am sick and can't take my medicine - keep a list of all the medicines I take; vitamins and herbals too - learn to read medicine labels - use a pillbox to sort medicine - use an alarm clock or phone to remind me to take my medicine    Why is this important?   These steps will help you keep on track with your medicines.   Notes:  Please call with any questions about your medications.         There are no care plans to display for this patient.    [name] was given information about Chronic Care Management services today including:  CCM service includes personalized support from designated clinical staff supervised by his physician, including individualized plan of care and coordination with other care providers 24/7 contact phone numbers for assistance for urgent and routine care needs. Standard insurance, coinsurance, copays and deductibles apply for chronic care management only during months in which we provide at least 20 minutes of these services. Most insurances cover these services at 100%, however patients may be responsible for any copay, coinsurance and/or deductible if applicable. This service may help you avoid the need for more expensive face-to-face services. Only one practitioner may furnish and bill the service in a calendar month. The patient may stop CCM services at any time (effective at the end of the month) by phone call to the office staff.  Patient agreed to services and verbal consent obtained.   The patient verbalized  understanding of instructions, educational materials, and care plan provided today and agreed to receive a mailed copy of patient instructions, educational materials, and care plan.   [name], PharmD Clinical Pharmacist [facility] [phone]

## 2021-02-13 DIAGNOSIS — N2581 Secondary hyperparathyroidism of renal origin: Secondary | ICD-10-CM | POA: Diagnosis not present

## 2021-02-13 DIAGNOSIS — D631 Anemia in chronic kidney disease: Secondary | ICD-10-CM | POA: Diagnosis not present

## 2021-02-13 DIAGNOSIS — D509 Iron deficiency anemia, unspecified: Secondary | ICD-10-CM | POA: Diagnosis not present

## 2021-02-13 DIAGNOSIS — D689 Coagulation defect, unspecified: Secondary | ICD-10-CM | POA: Diagnosis not present

## 2021-02-13 DIAGNOSIS — N186 End stage renal disease: Secondary | ICD-10-CM | POA: Diagnosis not present

## 2021-02-13 DIAGNOSIS — Z992 Dependence on renal dialysis: Secondary | ICD-10-CM | POA: Diagnosis not present

## 2021-02-16 DIAGNOSIS — N186 End stage renal disease: Secondary | ICD-10-CM | POA: Diagnosis not present

## 2021-02-16 DIAGNOSIS — Z992 Dependence on renal dialysis: Secondary | ICD-10-CM | POA: Diagnosis not present

## 2021-02-16 DIAGNOSIS — D631 Anemia in chronic kidney disease: Secondary | ICD-10-CM | POA: Diagnosis not present

## 2021-02-16 DIAGNOSIS — N2581 Secondary hyperparathyroidism of renal origin: Secondary | ICD-10-CM | POA: Diagnosis not present

## 2021-02-16 DIAGNOSIS — D509 Iron deficiency anemia, unspecified: Secondary | ICD-10-CM | POA: Diagnosis not present

## 2021-02-17 DIAGNOSIS — M19019 Primary osteoarthritis, unspecified shoulder: Secondary | ICD-10-CM | POA: Diagnosis not present

## 2021-02-18 DIAGNOSIS — N2581 Secondary hyperparathyroidism of renal origin: Secondary | ICD-10-CM | POA: Diagnosis not present

## 2021-02-18 DIAGNOSIS — D689 Coagulation defect, unspecified: Secondary | ICD-10-CM | POA: Diagnosis not present

## 2021-02-18 DIAGNOSIS — N186 End stage renal disease: Secondary | ICD-10-CM | POA: Diagnosis not present

## 2021-02-18 DIAGNOSIS — D631 Anemia in chronic kidney disease: Secondary | ICD-10-CM | POA: Diagnosis not present

## 2021-02-18 DIAGNOSIS — Z992 Dependence on renal dialysis: Secondary | ICD-10-CM | POA: Diagnosis not present

## 2021-02-18 DIAGNOSIS — D509 Iron deficiency anemia, unspecified: Secondary | ICD-10-CM | POA: Diagnosis not present

## 2021-02-20 DIAGNOSIS — N186 End stage renal disease: Secondary | ICD-10-CM | POA: Diagnosis not present

## 2021-02-20 DIAGNOSIS — D509 Iron deficiency anemia, unspecified: Secondary | ICD-10-CM | POA: Diagnosis not present

## 2021-02-20 DIAGNOSIS — I10 Essential (primary) hypertension: Secondary | ICD-10-CM | POA: Diagnosis not present

## 2021-02-20 DIAGNOSIS — D631 Anemia in chronic kidney disease: Secondary | ICD-10-CM | POA: Diagnosis not present

## 2021-02-20 DIAGNOSIS — Z992 Dependence on renal dialysis: Secondary | ICD-10-CM | POA: Diagnosis not present

## 2021-02-20 DIAGNOSIS — N2581 Secondary hyperparathyroidism of renal origin: Secondary | ICD-10-CM | POA: Diagnosis not present

## 2021-02-21 DIAGNOSIS — Z992 Dependence on renal dialysis: Secondary | ICD-10-CM | POA: Diagnosis not present

## 2021-02-21 DIAGNOSIS — N186 End stage renal disease: Secondary | ICD-10-CM | POA: Diagnosis not present

## 2021-02-21 DIAGNOSIS — I120 Hypertensive chronic kidney disease with stage 5 chronic kidney disease or end stage renal disease: Secondary | ICD-10-CM | POA: Diagnosis not present

## 2021-02-23 DIAGNOSIS — D631 Anemia in chronic kidney disease: Secondary | ICD-10-CM | POA: Diagnosis not present

## 2021-02-23 DIAGNOSIS — N186 End stage renal disease: Secondary | ICD-10-CM | POA: Diagnosis not present

## 2021-02-23 DIAGNOSIS — Z992 Dependence on renal dialysis: Secondary | ICD-10-CM | POA: Diagnosis not present

## 2021-02-23 DIAGNOSIS — N2581 Secondary hyperparathyroidism of renal origin: Secondary | ICD-10-CM | POA: Diagnosis not present

## 2021-02-23 DIAGNOSIS — D509 Iron deficiency anemia, unspecified: Secondary | ICD-10-CM | POA: Diagnosis not present

## 2021-02-25 DIAGNOSIS — E1129 Type 2 diabetes mellitus with other diabetic kidney complication: Secondary | ICD-10-CM | POA: Diagnosis not present

## 2021-02-25 DIAGNOSIS — D509 Iron deficiency anemia, unspecified: Secondary | ICD-10-CM | POA: Diagnosis not present

## 2021-02-25 DIAGNOSIS — N186 End stage renal disease: Secondary | ICD-10-CM | POA: Diagnosis not present

## 2021-02-25 DIAGNOSIS — N2581 Secondary hyperparathyroidism of renal origin: Secondary | ICD-10-CM | POA: Diagnosis not present

## 2021-02-25 DIAGNOSIS — D631 Anemia in chronic kidney disease: Secondary | ICD-10-CM | POA: Diagnosis not present

## 2021-02-25 DIAGNOSIS — Z992 Dependence on renal dialysis: Secondary | ICD-10-CM | POA: Diagnosis not present

## 2021-02-25 DIAGNOSIS — I4892 Unspecified atrial flutter: Secondary | ICD-10-CM | POA: Diagnosis not present

## 2021-02-27 ENCOUNTER — Telehealth: Payer: Self-pay

## 2021-02-27 DIAGNOSIS — N2581 Secondary hyperparathyroidism of renal origin: Secondary | ICD-10-CM | POA: Diagnosis not present

## 2021-02-27 DIAGNOSIS — Z992 Dependence on renal dialysis: Secondary | ICD-10-CM | POA: Diagnosis not present

## 2021-02-27 DIAGNOSIS — D509 Iron deficiency anemia, unspecified: Secondary | ICD-10-CM | POA: Diagnosis not present

## 2021-02-27 DIAGNOSIS — N186 End stage renal disease: Secondary | ICD-10-CM | POA: Diagnosis not present

## 2021-02-27 DIAGNOSIS — D631 Anemia in chronic kidney disease: Secondary | ICD-10-CM | POA: Diagnosis not present

## 2021-02-27 NOTE — Chronic Care Management (AMB) (Addendum)
Chronic Care Management Pharmacy Assistant   Name: [name]  MRN: [id number] DOB: 1961-05-09  Reason for Encounter: Medication Review/ Coordination of Enhanced Pharmacy Services   02/27/2021- [facility] onboard form filled in with all medication. Active medications listed: Digoxin 125 mg- 1 tablet Monday, Wednesday and Friday last filled 01/08/21 for 90 DS Gabapentin 100 mg- 1 tablet three times a day last filled 12/17/20 for 30 DS Metoprolol Tartrate 25 mg- 1 tablet twice daily last filled 01/08/21 for 90 DS Pantoprazole 40 mg- 1 tablet daily last filled 01/08/21 for 90 DS Warfarin Sodium 7.5 mg- 1 tablet every evening last filled 01/31/21 for 90 DS  Called patient to review medication list in chart and coordinate delivery with [facility] for [facility] packaging service, no answer, voicemail box not set up, not able to leave a message, will retry.  Patient has a follow up visit with [name] 03/10/2021.   03/18/2021- Patient no showed follow up appointment with [name], CPP. Awaiting return call from patient to see if he would like to continue in pharmacy process.   Medications: Outpatient Encounter Medications as of 02/27/2021  Medication Sig Note   albuterol (VENTOLIN HFA) 108 (90 Base) MCG/ACT inhaler Inhale 2 puffs into the lungs every 6 (six) hours as needed for wheezing or shortness of breath.    amiodarone (PACERONE) 200 MG tablet Take 1 tablet (200 mg total) by mouth 2 (two) times daily.    calcium acetate (PHOSLO) 667 MG capsule Take 1-3 capsules (667-2,001 mg total) by mouth See admin instructions. Take 2001 mg capsules with meals three times daily and 667 mg capsule with snacks. (Patient taking differently: Take 2,001 mg by mouth daily.)    chlorhexidine (PERIDEX) 0.12 % solution 15 mLs by Mouth Rinse route 2 (two) times daily.    cinacalcet (SENSIPAR) 90 MG tablet Take 180 mg by mouth daily.    Control Gel Formula Dressing (DUODERM CGF EXTRA THIN) MISC  Apply 1 each topically daily as needed.    diclofenac Sodium (VOLTAREN) 1 % GEL Apply 2 g topically 4 (four) times daily.    digoxin (LANOXIN) 0.125 MG tablet Take 1 tablet (0.125 mg total) by mouth every Monday, Wednesday, and Friday.    ferric citrate (AURYXIA) 1 GM 210 MG(Fe) tablet Take 630 mg by mouth 3 (three) times daily with meals.    fluticasone furoate-vilanterol (BREO ELLIPTA) 100-25 MCG/INH AEPB Inhale 1 puff into the lungs 2 (two) times daily as needed (wheezing).    gabapentin (NEURONTIN) 100 MG capsule Take 1 capsule (100 mg total) by mouth 3 (three) times daily as needed (coughing uncontrollably).    HYDROcodone bit-homatropine (HYDROMET) 5-1.5 MG/5ML syrup Take 5 mLs by mouth every 6 (six) hours as needed for cough.    ipratropium (ATROVENT) 0.06 % nasal spray Place 2 sprays into both nostrils 4 (four) times daily. (Patient taking differently: Place 2 sprays into both nostrils 4 (four) times daily as needed for rhinitis.)    Lactobacillus (ACIDOPHILUS/BIFIDUS PO) Take 1 capsule by mouth daily. Written by Dr. Collene Mares to take once per day.    lidocaine-prilocaine (EMLA) cream Apply 1 application topically See admin instructions. Apply topically to port access prior to dialysis - Monday, Wednesday, Friday.    loperamide (IMODIUM A-D) 2 MG tablet Take 1 tablet (2 mg total) by mouth 4 (four) times daily as needed for diarrhea or loose stools. Also available OTC.    metoprolol succinate (TOPROL-XL) 25 MG 24 hr tablet Take 1 tablet (  25 mg total) by mouth daily.    metoprolol tartrate (LOPRESSOR) 25 MG tablet Take 25 mg by mouth 2 (two) times daily. (Patient not taking: Reported on 01/29/2021)    midodrine (PROAMATINE) 10 MG tablet Take 1 tablet (10 mg total) by mouth 3 (three) times daily.    multivitamin (RENA-VIT) TABS tablet Take 1 tablet by mouth at bedtime.    nitroGLYCERIN (NITROSTAT) 0.4 MG SL tablet Place 0.4 mg under the tongue every 5 (five) minutes as needed for chest pain.      Nutritional Supplements (FEEDING SUPPLEMENT, NEPRO CARB STEADY,) LIQD Take 237 mLs by mouth daily.    ondansetron (ZOFRAN ODT) 4 MG disintegrating tablet Take 1 tablet (4 mg total) by mouth every 8 (eight) hours as needed for nausea or vomiting.    oxyCODONE-acetaminophen (PERCOCET/ROXICET) 5-325 MG tablet Take 1 tablet by mouth every 4 (four) hours as needed for severe pain.    pantoprazole (PROTONIX) 40 MG tablet Take 40 mg by mouth 2 (two) times daily.    sucroferric oxyhydroxide (VELPHORO) 500 MG chewable tablet Chew 500-1,000 mg by mouth See admin instructions. Takes 2 tablets with each meal and 1 tablet with snacks.    traMADol (ULTRAM) 50 MG tablet Take by mouth. (Patient not taking: Reported on 01/29/2021)    UNABLE TO FIND Out patient physical therapy- vestibular therapy for BPPV    warfarin (COUMADIN) 7.5 MG tablet Take 7.5 mg by mouth daily. 01/29/2021: Taking 1 and 1/2 tablets once per day at 6 PM. Does INR drawing at dialysis and give results to the heart doctor on Friday, once per week.    No facility-administered encounter medications on file as of 02/27/2021.   Pattricia Boss, [place] Pharmacist Assistant [phone]

## 2021-03-02 DIAGNOSIS — N186 End stage renal disease: Secondary | ICD-10-CM | POA: Diagnosis not present

## 2021-03-02 DIAGNOSIS — Z992 Dependence on renal dialysis: Secondary | ICD-10-CM | POA: Diagnosis not present

## 2021-03-02 DIAGNOSIS — D631 Anemia in chronic kidney disease: Secondary | ICD-10-CM | POA: Diagnosis not present

## 2021-03-02 DIAGNOSIS — N2581 Secondary hyperparathyroidism of renal origin: Secondary | ICD-10-CM | POA: Diagnosis not present

## 2021-03-02 DIAGNOSIS — D509 Iron deficiency anemia, unspecified: Secondary | ICD-10-CM | POA: Diagnosis not present

## 2021-03-04 DIAGNOSIS — N186 End stage renal disease: Secondary | ICD-10-CM | POA: Diagnosis not present

## 2021-03-04 DIAGNOSIS — N2581 Secondary hyperparathyroidism of renal origin: Secondary | ICD-10-CM | POA: Diagnosis not present

## 2021-03-04 DIAGNOSIS — D509 Iron deficiency anemia, unspecified: Secondary | ICD-10-CM | POA: Diagnosis not present

## 2021-03-04 DIAGNOSIS — Z992 Dependence on renal dialysis: Secondary | ICD-10-CM | POA: Diagnosis not present

## 2021-03-04 DIAGNOSIS — D631 Anemia in chronic kidney disease: Secondary | ICD-10-CM | POA: Diagnosis not present

## 2021-03-05 DIAGNOSIS — M25511 Pain in right shoulder: Secondary | ICD-10-CM | POA: Diagnosis not present

## 2021-03-05 DIAGNOSIS — Z992 Dependence on renal dialysis: Secondary | ICD-10-CM | POA: Diagnosis not present

## 2021-03-05 DIAGNOSIS — Z6834 Body mass index (BMI) 34.0-34.9, adult: Secondary | ICD-10-CM | POA: Diagnosis not present

## 2021-03-05 DIAGNOSIS — M5030 Other cervical disc degeneration, unspecified cervical region: Secondary | ICD-10-CM | POA: Diagnosis not present

## 2021-03-05 DIAGNOSIS — M25512 Pain in left shoulder: Secondary | ICD-10-CM | POA: Diagnosis not present

## 2021-03-06 DIAGNOSIS — Z992 Dependence on renal dialysis: Secondary | ICD-10-CM | POA: Diagnosis not present

## 2021-03-06 DIAGNOSIS — D631 Anemia in chronic kidney disease: Secondary | ICD-10-CM | POA: Diagnosis not present

## 2021-03-06 DIAGNOSIS — N186 End stage renal disease: Secondary | ICD-10-CM | POA: Diagnosis not present

## 2021-03-06 DIAGNOSIS — D509 Iron deficiency anemia, unspecified: Secondary | ICD-10-CM | POA: Diagnosis not present

## 2021-03-06 DIAGNOSIS — N2581 Secondary hyperparathyroidism of renal origin: Secondary | ICD-10-CM | POA: Diagnosis not present

## 2021-03-09 ENCOUNTER — Telehealth: Payer: Self-pay

## 2021-03-09 DIAGNOSIS — D631 Anemia in chronic kidney disease: Secondary | ICD-10-CM | POA: Diagnosis not present

## 2021-03-09 DIAGNOSIS — N186 End stage renal disease: Secondary | ICD-10-CM | POA: Diagnosis not present

## 2021-03-09 DIAGNOSIS — D509 Iron deficiency anemia, unspecified: Secondary | ICD-10-CM | POA: Diagnosis not present

## 2021-03-09 DIAGNOSIS — N2581 Secondary hyperparathyroidism of renal origin: Secondary | ICD-10-CM | POA: Diagnosis not present

## 2021-03-09 DIAGNOSIS — Z992 Dependence on renal dialysis: Secondary | ICD-10-CM | POA: Diagnosis not present

## 2021-03-09 NOTE — Chronic Care Management (AMB) (Signed)
03/09/2021-  APPOINTMENT REMINDER   Called [name], No answer, unable to leave a message, voicemail box not set up. Patient has an appointment on 03/10/2021 at 10:00 am via telephone visit with [name], Pharm D.   Pattricia Boss, [place] Pharmacist Assistant [phone]

## 2021-03-10 ENCOUNTER — Telehealth: Payer: Medicare Other

## 2021-03-10 NOTE — Progress Notes (Deleted)
Chronic Care Management Pharmacy Note  03/10/2021 Name:  [name] MRN:  [id number] DOB:  1961-05-11  Summary: ***  Recommendations/Changes made from today's visit: ***  Plan: ***   Subjective: [name] is an 60 y.o. year old male who is a primary patient of [name], [place].  The CCM team was consulted for assistance with disease management and care coordination needs.    {CCMTELEPHONEFACETOFACE:21091510} for {CCMINITIALFOLLOWUPCHOICE:21091511} in response to provider referral for pharmacy case management and/or care coordination services.   Consent to Services:  {CCMCONSENTOPTIONS:25074}  Patient Care Team: [name], FNP as PCP - General (General Practice) Michael Boston, MD as Consulting Physician (General Surgery) Juanita Craver, MD as Consulting Physician (Gastroenterology) Dixie Dials, MD as Consulting Physician (Cardiology) Donato Heinz, MD as Consulting Physician (Nephrology) Mayford Knife, [facility] (Pharmacist)  Recent office visits: ***  Recent consult visits: [facility] visits: {Hospital DC Yes/No:25215}   Objective:  Lab Results  Component Value Date   CREATININE 7.15 (H) 06/07/2020   BUN 23 (H) 06/07/2020   GFRNONAA 8 (L) 06/07/2020   GFRAA 6 (L) 11/15/2019   NA 135 06/07/2020   K 4.3 06/07/2020   CALCIUM 10.3 08/13/2020   CO2 25 06/07/2020   GLUCOSE 87 06/07/2020    Lab Results  Component Value Date/Time   HGBA1C 5.3 11/27/2020 08:56 AM   HGBA1C 5.8 (H) 09/25/2020 04:35 PM    Last diabetic Eye exam: No results found for: HMDIABEYEEXA  Last diabetic Foot exam: No results found for: HMDIABFOOTEX   Lab Results  Component Value Date   CHOL 181 09/25/2020   HDL 57 09/25/2020   LDLCALC 99 09/25/2020   TRIG 141 09/25/2020   CHOLHDL 3.2 09/25/2020    Hepatic Function Latest Ref Rng & Units 06/06/2020 06/05/2020 04/25/2020  Total Protein 6.5 - 8.1 g/dL - 7.9 -  Albumin 3.5 - 5.0 g/dL 3.1(L) 3.3(L) 3.4(L)  AST 15 -  41 U/L - 10(L) -  ALT 0 - 44 U/L - 9 -  Alk Phosphatase 38 - 126 U/L - 104 -  Total Bilirubin 0.3 - 1.2 mg/dL - 0.8 -  Bilirubin, Direct 0.0 - 0.2 mg/dL - <0.1 -    Lab Results  Component Value Date/Time   TSH 1.591 06/05/2020 11:11 AM   TSH 1.270 06/20/2018 11:28 AM   TSH 0.768 06/27/2015 04:19 PM   TSH 2.034 05/19/2011 05:46 PM    CBC Latest Ref Rng & Units 08/13/2020 06/09/2020 06/08/2020  WBC 4.0 - 10.5 K/uL - 6.4 6.6  Hemoglobin 13.5 - 17.5 10.9(A) 10.7(L) 10.7(L)  Hematocrit 39.0 - 52.0 % - 33.5(L) 32.6(L)  Platelets 150 - 400 K/uL - 190 196    No results found for: VD25OH  Clinical ASCVD: {YES/NO:21197} The ASCVD Risk score (Arnett DK, et al., 2019) failed to calculate for the following reasons:   The patient has a prior MI or stroke diagnosis    Depression screen [facility] 2/9 12/25/2020 11/27/2020 11/15/2019  Decreased Interest 0 0 0  Down, Depressed, Hopeless 0 0 0  PHQ - 2 Score 0 0 0  Altered sleeping - - -  Tired, decreased energy - - -  Change in appetite - - -  Feeling bad or failure about yourself  - - -  Trouble concentrating - - -  Moving slowly or fidgety/restless - - -  Suicidal thoughts - - -  PHQ-9 Score - - -  Some recent data might be hidden     ***Other: (  CHADS2VASc if Afib, MMRC or CAT for COPD, ACT, DEXA)  Social History   Tobacco Use  Smoking Status Never  Smokeless Tobacco Never   BP Readings from Last 3 Encounters:  12/25/20 108/64  11/27/20 108/62  09/25/20 112/68   Pulse Readings from Last 3 Encounters:  12/25/20 (!) 103  11/27/20 94  09/25/20 (!) 104   Wt Readings from Last 3 Encounters:  12/25/20 237 lb 6.4 oz (107.7 kg)  11/27/20 238 lb 6.4 oz (108.1 kg)  09/25/20 236 lb (107 kg)   BMI Readings from Last 3 Encounters:  12/25/20 31.32 kg/m  11/27/20 31.45 kg/m  09/25/20 31.14 kg/m    Assessment/Interventions: Review of patient past medical history, allergies, medications, health status, including review of consultants  reports, laboratory and other test data, was performed as part of comprehensive evaluation and provision of chronic care management services.   SDOH:  (Social Determinants of Health) assessments and interventions performed: {yes/no:20286}  SDOH Screenings   Alcohol Screen: Not on file  Depression (PHQ2-9): Low Risk    PHQ-2 Score: 0  Financial Resource Strain: Low Risk    Difficulty of Paying Living Expenses: Not hard at all  Food Insecurity: No Food Insecurity   Worried About Charity fundraiser in the Last Year: Never true   Ran Out of Food in the Last Year: Never true  Housing: Not on file  Physical Activity: Inactive   Days of Exercise per Week: 0 days   Minutes of Exercise per Session: 0 min  Social Connections: Not on file  Stress: Stress Concern Present   Feeling of Stress : To some extent  Tobacco Use: Low Risk    Smoking Tobacco Use: Never   Smokeless Tobacco Use: Never  Transportation Needs: No Transportation Needs   Lack of Transportation (Medical): No   Lack of Transportation (Non-Medical): No    CCM Care Plan  Allergies  Allergen Reactions   Ace Inhibitors Itching, Cough and Other (See Comments)    Medications Reviewed Today     Reviewed by Mayford Knife, [facility] (Pharmacist) on 01/29/21 at 0926  Med List Status: <None>   Medication Order Taking? Sig Documenting Provider Last Dose Status Informant  albuterol (VENTOLIN HFA) 108 (90 Base) MCG/ACT inhaler [id number] Yes Inhale 2 puffs into the lungs every 6 (six) hours as needed for wheezing or shortness of breath. [name], FNP Taking Active Multiple Informants  amiodarone (PACERONE) 200 MG tablet [id number] Yes Take 1 tablet (200 mg total) by mouth 2 (two) times daily. Rai, Vernelle Emerald, MD Taking Active   calcium acetate (PHOSLO) 667 MG capsule [id number] Yes Take 1-3 capsules (667-2,001 mg total) by mouth See admin instructions. Take 2001 mg capsules with meals three times daily and 667 mg capsule with snacks.   Patient taking differently: Take 2,001 mg by mouth daily.   Dixie Dials, MD Taking Active   chlorhexidine (PERIDEX) 0.12 % solution [id number] Yes 15 mLs by Mouth Rinse route 2 (two) times daily. [provider] Taking Active Multiple Informants  cinacalcet (SENSIPAR) 90 MG tablet [id number] Yes Take 180 mg by mouth daily. [provider] Taking Active   Control Gel Formula Dressing (DUODERM CGF EXTRA THIN) [place] [id number] Yes Apply 1 each topically daily as needed. [name], FNP Taking Active   diclofenac Sodium (VOLTAREN) 1 % GEL [id number] Yes Apply 2 g topically 4 (four) times daily. [name], FNP Taking Active Multiple Informants  digoxin (LANOXIN) 0.125 MG tablet [id number] Yes Take 1 tablet (  0.125 mg total) by mouth every Monday, Wednesday, and Friday. Rai, Vernelle Emerald, MD Taking Active   ferric citrate (AURYXIA) 1 GM 210 MG(Fe) tablet [id number] Yes Take 630 mg by mouth 3 (three) times daily with meals. [provider] Taking Active Multiple Informants  fluticasone furoate-vilanterol (BREO ELLIPTA) 100-25 MCG/INH AEPB [id number] Yes Inhale 1 puff into the lungs 2 (two) times daily as needed (wheezing). [name], FNP Taking Active   gabapentin (NEURONTIN) 100 MG capsule [id number] Yes Take 1 capsule (100 mg total) by mouth 3 (three) times daily as needed (coughing uncontrollably). Bary Castilla, NP Taking Active   HYDROcodone bit-homatropine (HYDROMET) 5-1.5 MG/5ML syrup [id number] Yes Take 5 mLs by mouth every 6 (six) hours as needed for cough. [name], FNP Taking Active   ipratropium (ATROVENT) 0.06 % nasal spray [id number] Yes Place 2 sprays into both nostrils 4 (four) times daily.  Patient taking differently: Place 2 sprays into both nostrils 4 (four) times daily as needed for rhinitis.   [name], FNP Taking Active   lidocaine-prilocaine (EMLA) cream [id number] Yes Apply 1 application topically See admin instructions. Apply topically to port  access prior to dialysis - Monday, Wednesday, Friday. [provider] Taking Active Multiple Informants           Med Note Orvan Seen, Sharlette Dense   Fri Jun 27, 2015  4:02 PM)    loperamide (IMODIUM A-D) 2 MG tablet [id number] Yes Take 1 tablet (2 mg total) by mouth 4 (four) times daily as needed for diarrhea or loose stools. Also available OTC. Rai, Vernelle Emerald, MD Taking Active   metoprolol succinate (TOPROL-XL) 25 MG 24 hr tablet [id number] Yes Take 1 tablet (25 mg total) by mouth daily. Rai, Vernelle Emerald, MD Taking Active   metoprolol tartrate (LOPRESSOR) 25 MG tablet [id number] No Take 25 mg by mouth 2 (two) times daily.  Patient not taking: Reported on 01/29/2021   [provider] Not Taking Active   midodrine (PROAMATINE) 10 MG tablet [id number] Yes Take 1 tablet (10 mg total) by mouth 3 (three) times daily. Rai, Vernelle Emerald, MD Taking Active   multivitamin (RENA-VIT) TABS tablet [id number] Yes Take 1 tablet by mouth at bedtime. [provider] Taking Active Multiple Informants           Med Note Lenor Derrick Jul 31, 2018  8:39 AM)    nitroGLYCERIN (NITROSTAT) 0.4 MG SL tablet [id number] Yes Place 0.4 mg under the tongue every 5 (five) minutes as needed for chest pain.  [provider] Taking Active Multiple Informants           Med Note Eustace Moore   Fri Mar 09, 2019 12:47 PM)    Nutritional Supplements (FEEDING SUPPLEMENT, NEPRO CARB STEADY,) LIQD [id number] Yes Take 237 mLs by mouth daily. [provider] Taking Active Multiple Informants  ondansetron (ZOFRAN ODT) 4 MG disintegrating tablet [id number] Yes Take 1 tablet (4 mg total) by mouth every 8 (eight) hours as needed for nausea or vomiting. Rai, Vernelle Emerald, MD Taking Active   oxyCODONE-acetaminophen (PERCOCET/ROXICET) 5-325 MG tablet [id number] Yes Take 1 tablet by mouth every 4 (four) hours as needed for severe pain. [provider] Taking Active Multiple Informants  pantoprazole  (PROTONIX) 40 MG tablet [id number] Yes Take 40 mg by mouth 2 (two) times daily. [provider] Taking Active Multiple Informants  sucroferric oxyhydroxide (VELPHORO) 500 MG chewable tablet [id number] Yes Chew 500-1,000 mg by mouth See admin instructions. Takes 2 tablets  with each meal and 1 tablet with snacks. [provider] Taking Active Multiple Informants  traMADol (ULTRAM) 50 MG tablet [id number] No Take by mouth.  Patient not taking: Reported on 01/29/2021   [provider] Not Taking Active   UNABLE TO FIND [id number]  Out patient physical therapy- vestibular therapy for [facility] Debbe Odea, MD  Active   warfarin (COUMADIN) 7.5 MG tablet [id number] Yes Take 7.5 mg by mouth daily. [provider] Taking Active Multiple Informants           Med Note Pricilla Holm Jan 29, 2021  9:26 AM) Taking 1 and 1/2 tablets once per day at 6 PM. Does INR drawing at dialysis and give results to the heart doctor on Friday, once per week.   Med List Note Payton Doughty, CPhT 06/27/15 1553): Dialysis Monday, Wednesday, Friday, [address], [place], [place]            Patient Active Problem List   Diagnosis Date Noted   Infectious gastroenteritis and colitis, unspecified 06/10/2020   Hypotension due to hypovolemia 06/04/2020   Allergy, unspecified, subsequent encounter 01/31/2020   Anaphylactic shock, unspecified, subsequent encounter 01/31/2020   Hypotension of hemodialysis 01/18/2019   Dizziness 11/22/2018   Hypertensive heart and CKD, ESRD on dialysis, w CHF ([place]) 11/09/2018   Fistula, anal 08/15/2018   Supratherapeutic INR 06/20/2018   Melena 06/20/2018   Malnutrition of moderate degree 02/10/2018   Anemia 01/27/2018   Paroxysmal atrial fibrillation ([place]) 01/26/2018   Depression 01/26/2018   GERD (gastroesophageal reflux disease) 01/26/2018   Pulmonary embolism ([place]) 01/26/2018   Abnormal coagulation profile 01/24/2018   Cyst of kidney, acquired  01/24/2018   Other specified disorders of kidney and ureter 01/24/2018   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   Acute on chronic combined systolic and diastolic CHF, NYHA class 4 ([place]) 07/17/2017   Dependence on renal dialysis ([place]) 07/15/2017   Hypotension 03/16/2017   Paroxysmal atrial flutter ([place]) 03/16/2017   Chronic pain 03/16/2017   Leg pain, bilateral 03/16/2017   Fluid overload, unspecified 05/26/2016   Elevated troponin 03/15/2016   Rib pain on right side    Encounter for removal of sutures 01/13/2016   Atrial flutter with rapid ventricular response ([place]) 06/27/2015   Right upper quadrant abdominal pain 01/13/2015   Diverticulitis large intestine w/o perforation or abscess w/o bleeding 12/18/2014   Hepatic cyst 12/18/2014   Diarrhea, unspecified 09/05/2014   Fever, unspecified 09/05/2014   Pruritus, unspecified 09/05/2014   Shortness of breath 09/05/2014   Hyperparathyroidism, unspecified ([place]) 72/62/0355   Complication of vascular dialysis catheter 05/16/2013   Cerebral infarction due to unspecified occlusion or stenosis of unspecified cerebral artery ([place]) 10/09/2012   Muscle weakness (generalized) 10/09/2012   Right sided weakness 10/05/2012   Disorder of phosphorus metabolism, unspecified 09/20/2012   Hypercalcemia 09/20/2012   Coagulation defect, unspecified ([place]) 07/08/2012   History of nephrectomy 97/41/6384   Umbilical hernia with obstruction, without gangrene 07/04/2012   Other specified complication of vascular prosthetic devices, implants and grafts, subsequent encounter 03/07/2012   Other cardiomyopathies ([place]) 02/14/2012   Body mass index (BMI) 38.0-38.9, adult 12/08/2011   End-stage renal disease on hemodialysis ([place]) 06/04/2011   Acquired absence of kidney 06/03/2011   Alcohol abuse, uncomplicated 53/64/6803   Allergy status to other drugs, medicaments and biological substances 06/03/2011   Anemia in chronic kidney disease 06/03/2011   Atherosclerotic heart  disease of native coronary artery without angina pectoris 06/03/2011   Hyperlipidemia, unspecified  06/03/2011   Hypertensive chronic kidney disease with stage 5 chronic kidney disease or end stage renal disease ([place]) 06/03/2011   Iron deficiency anemia, unspecified 06/03/2011   Ischemic cardiomyopathy 06/03/2011   Old myocardial infarction 06/03/2011   Personal history of other malignant neoplasm of kidney 06/03/2011   Personal history of transient ischemic attack (TIA), and cerebral infarction without residual deficits 06/03/2011   Secondary hyperparathyroidism of renal origin ([place]) 06/03/2011   Chronic combined systolic (congestive) and diastolic (congestive) heart failure ([place]) 05/19/2011    Class: Acute   Hypertension, benign 05/19/2011    Class: Chronic   DM II (diabetes mellitus, type II), controlled ([place]) 05/19/2011    Class: Chronic   Gout, chronic 05/19/2011    Immunization History  Administered Date(s) Administered   PFIZER(Purple Top)SARS-COV-2 Vaccination 08/03/2019, 08/29/2019, 02/22/2020    Conditions to be addressed/monitored:  {USCCMDZASSESSMENTOPTIONS:23563}  There are no care plans that you recently modified to display for this patient.    Medication Assistance: {MEDASSISTANCEINFO:25044}  Compliance/Adherence/Medication fill history: Care Gaps: ***  Star-Rating Drugs: ***  Patient's preferred pharmacy is:  [id number] [address], [place] [address] Phone: [phone] Fax: [phone]  Uses pill box? {Yes or If no, why not?:20788} Pt endorses ***% compliance  We discussed: {Pharmacy options:24294} Patient decided to: {US Pharmacy Plan:23885}  Care Plan and Follow Up Patient Decision:  {FOLLOWUP:24991}  Plan: {CM FOLLOW UP YPZX:80638}  ***    Current Barriers:  {pharmacybarriers:24917}  Pharmacist Clinical Goal(s):  Patient will {PHARMACYGOALCHOICES:24921} through  collaboration with PharmD and provider.   Interventions: 1:1 collaboration with [name], FNP regarding development and update of comprehensive plan of care as evidenced by provider attestation and co-signature Inter-disciplinary care team collaboration (see longitudinal plan of care) Comprehensive medication review performed; medication list updated in electronic medical record  {CCM PHARMD DISEASE STATES:25130}  Patient Goals/Self-Care Activities Patient will:  - {pharmacypatientgoals:24919}  Follow Up Plan: {CM FOLLOW UP QUHK:88301}

## 2021-03-11 DIAGNOSIS — D631 Anemia in chronic kidney disease: Secondary | ICD-10-CM | POA: Diagnosis not present

## 2021-03-11 DIAGNOSIS — D509 Iron deficiency anemia, unspecified: Secondary | ICD-10-CM | POA: Diagnosis not present

## 2021-03-11 DIAGNOSIS — N186 End stage renal disease: Secondary | ICD-10-CM | POA: Diagnosis not present

## 2021-03-11 DIAGNOSIS — D689 Coagulation defect, unspecified: Secondary | ICD-10-CM | POA: Diagnosis not present

## 2021-03-11 DIAGNOSIS — Z992 Dependence on renal dialysis: Secondary | ICD-10-CM | POA: Diagnosis not present

## 2021-03-11 DIAGNOSIS — N2581 Secondary hyperparathyroidism of renal origin: Secondary | ICD-10-CM | POA: Diagnosis not present

## 2021-03-13 DIAGNOSIS — D631 Anemia in chronic kidney disease: Secondary | ICD-10-CM | POA: Diagnosis not present

## 2021-03-13 DIAGNOSIS — Z992 Dependence on renal dialysis: Secondary | ICD-10-CM | POA: Diagnosis not present

## 2021-03-13 DIAGNOSIS — D509 Iron deficiency anemia, unspecified: Secondary | ICD-10-CM | POA: Diagnosis not present

## 2021-03-13 DIAGNOSIS — N2581 Secondary hyperparathyroidism of renal origin: Secondary | ICD-10-CM | POA: Diagnosis not present

## 2021-03-13 DIAGNOSIS — N186 End stage renal disease: Secondary | ICD-10-CM | POA: Diagnosis not present

## 2021-03-16 DIAGNOSIS — Z992 Dependence on renal dialysis: Secondary | ICD-10-CM | POA: Diagnosis not present

## 2021-03-16 DIAGNOSIS — D631 Anemia in chronic kidney disease: Secondary | ICD-10-CM | POA: Diagnosis not present

## 2021-03-16 DIAGNOSIS — N2581 Secondary hyperparathyroidism of renal origin: Secondary | ICD-10-CM | POA: Diagnosis not present

## 2021-03-16 DIAGNOSIS — N186 End stage renal disease: Secondary | ICD-10-CM | POA: Diagnosis not present

## 2021-03-16 DIAGNOSIS — D509 Iron deficiency anemia, unspecified: Secondary | ICD-10-CM | POA: Diagnosis not present

## 2021-03-17 ENCOUNTER — Encounter (HOSPITAL_COMMUNITY): Payer: Self-pay | Admitting: *Deleted

## 2021-03-18 DIAGNOSIS — Z992 Dependence on renal dialysis: Secondary | ICD-10-CM | POA: Diagnosis not present

## 2021-03-18 DIAGNOSIS — D689 Coagulation defect, unspecified: Secondary | ICD-10-CM | POA: Diagnosis not present

## 2021-03-18 DIAGNOSIS — D509 Iron deficiency anemia, unspecified: Secondary | ICD-10-CM | POA: Diagnosis not present

## 2021-03-18 DIAGNOSIS — D631 Anemia in chronic kidney disease: Secondary | ICD-10-CM | POA: Diagnosis not present

## 2021-03-18 DIAGNOSIS — N2581 Secondary hyperparathyroidism of renal origin: Secondary | ICD-10-CM | POA: Diagnosis not present

## 2021-03-18 DIAGNOSIS — N186 End stage renal disease: Secondary | ICD-10-CM | POA: Diagnosis not present

## 2021-03-20 DIAGNOSIS — N2581 Secondary hyperparathyroidism of renal origin: Secondary | ICD-10-CM | POA: Diagnosis not present

## 2021-03-20 DIAGNOSIS — D631 Anemia in chronic kidney disease: Secondary | ICD-10-CM | POA: Diagnosis not present

## 2021-03-20 DIAGNOSIS — N186 End stage renal disease: Secondary | ICD-10-CM | POA: Diagnosis not present

## 2021-03-20 DIAGNOSIS — D509 Iron deficiency anemia, unspecified: Secondary | ICD-10-CM | POA: Diagnosis not present

## 2021-03-20 DIAGNOSIS — Z992 Dependence on renal dialysis: Secondary | ICD-10-CM | POA: Diagnosis not present

## 2021-03-23 DIAGNOSIS — Z992 Dependence on renal dialysis: Secondary | ICD-10-CM | POA: Diagnosis not present

## 2021-03-23 DIAGNOSIS — D509 Iron deficiency anemia, unspecified: Secondary | ICD-10-CM | POA: Diagnosis not present

## 2021-03-23 DIAGNOSIS — D631 Anemia in chronic kidney disease: Secondary | ICD-10-CM | POA: Diagnosis not present

## 2021-03-23 DIAGNOSIS — N186 End stage renal disease: Secondary | ICD-10-CM | POA: Diagnosis not present

## 2021-03-23 DIAGNOSIS — N2581 Secondary hyperparathyroidism of renal origin: Secondary | ICD-10-CM | POA: Diagnosis not present

## 2021-03-24 ENCOUNTER — Encounter: Payer: Medicare Other | Admitting: Nurse Practitioner

## 2021-03-24 DIAGNOSIS — N186 End stage renal disease: Secondary | ICD-10-CM | POA: Diagnosis not present

## 2021-03-24 DIAGNOSIS — Z992 Dependence on renal dialysis: Secondary | ICD-10-CM | POA: Diagnosis not present

## 2021-03-24 DIAGNOSIS — I120 Hypertensive chronic kidney disease with stage 5 chronic kidney disease or end stage renal disease: Secondary | ICD-10-CM | POA: Diagnosis not present

## 2021-03-24 NOTE — Progress Notes (Signed)
Patient not seen.

## 2021-03-25 DIAGNOSIS — N2581 Secondary hyperparathyroidism of renal origin: Secondary | ICD-10-CM | POA: Diagnosis not present

## 2021-03-25 DIAGNOSIS — Z992 Dependence on renal dialysis: Secondary | ICD-10-CM | POA: Diagnosis not present

## 2021-03-25 DIAGNOSIS — N186 End stage renal disease: Secondary | ICD-10-CM | POA: Diagnosis not present

## 2021-03-25 DIAGNOSIS — D631 Anemia in chronic kidney disease: Secondary | ICD-10-CM | POA: Diagnosis not present

## 2021-03-27 DIAGNOSIS — N2581 Secondary hyperparathyroidism of renal origin: Secondary | ICD-10-CM | POA: Diagnosis not present

## 2021-03-27 DIAGNOSIS — D631 Anemia in chronic kidney disease: Secondary | ICD-10-CM | POA: Diagnosis not present

## 2021-03-27 DIAGNOSIS — N186 End stage renal disease: Secondary | ICD-10-CM | POA: Diagnosis not present

## 2021-03-27 DIAGNOSIS — Z992 Dependence on renal dialysis: Secondary | ICD-10-CM | POA: Diagnosis not present

## 2021-03-30 DIAGNOSIS — D631 Anemia in chronic kidney disease: Secondary | ICD-10-CM | POA: Diagnosis not present

## 2021-03-30 DIAGNOSIS — N186 End stage renal disease: Secondary | ICD-10-CM | POA: Diagnosis not present

## 2021-03-30 DIAGNOSIS — Z992 Dependence on renal dialysis: Secondary | ICD-10-CM | POA: Diagnosis not present

## 2021-03-30 DIAGNOSIS — N2581 Secondary hyperparathyroidism of renal origin: Secondary | ICD-10-CM | POA: Diagnosis not present

## 2021-03-30 DIAGNOSIS — Z20828 Contact with and (suspected) exposure to other viral communicable diseases: Secondary | ICD-10-CM | POA: Diagnosis not present

## 2021-04-01 DIAGNOSIS — Z992 Dependence on renal dialysis: Secondary | ICD-10-CM | POA: Diagnosis not present

## 2021-04-01 DIAGNOSIS — N186 End stage renal disease: Secondary | ICD-10-CM | POA: Diagnosis not present

## 2021-04-01 DIAGNOSIS — D631 Anemia in chronic kidney disease: Secondary | ICD-10-CM | POA: Diagnosis not present

## 2021-04-01 DIAGNOSIS — N2581 Secondary hyperparathyroidism of renal origin: Secondary | ICD-10-CM | POA: Diagnosis not present

## 2021-04-01 DIAGNOSIS — D689 Coagulation defect, unspecified: Secondary | ICD-10-CM | POA: Diagnosis not present

## 2021-04-02 ENCOUNTER — Other Ambulatory Visit: Payer: Self-pay | Admitting: Nurse Practitioner

## 2021-04-02 DIAGNOSIS — R053 Chronic cough: Secondary | ICD-10-CM

## 2021-04-02 MED ORDER — GABAPENTIN 100 MG PO CAPS: 100.0000 mg | ORAL_CAPSULE | Freq: Three times a day (TID) | ORAL | 1 refills | Status: DC | PRN

## 2021-04-03 DIAGNOSIS — N2581 Secondary hyperparathyroidism of renal origin: Secondary | ICD-10-CM | POA: Diagnosis not present

## 2021-04-03 DIAGNOSIS — N186 End stage renal disease: Secondary | ICD-10-CM | POA: Diagnosis not present

## 2021-04-03 DIAGNOSIS — Z992 Dependence on renal dialysis: Secondary | ICD-10-CM | POA: Diagnosis not present

## 2021-04-03 DIAGNOSIS — Z23 Encounter for immunization: Secondary | ICD-10-CM | POA: Diagnosis not present

## 2021-04-03 DIAGNOSIS — D631 Anemia in chronic kidney disease: Secondary | ICD-10-CM | POA: Diagnosis not present

## 2021-04-04 DIAGNOSIS — S8002XA Contusion of left knee, initial encounter: Secondary | ICD-10-CM | POA: Diagnosis not present

## 2021-04-04 DIAGNOSIS — S8992XA Unspecified injury of left lower leg, initial encounter: Secondary | ICD-10-CM | POA: Diagnosis not present

## 2021-04-04 DIAGNOSIS — M19012 Primary osteoarthritis, left shoulder: Secondary | ICD-10-CM | POA: Diagnosis not present

## 2021-04-04 DIAGNOSIS — G8911 Acute pain due to trauma: Secondary | ICD-10-CM | POA: Diagnosis not present

## 2021-04-04 DIAGNOSIS — M5030 Other cervical disc degeneration, unspecified cervical region: Secondary | ICD-10-CM | POA: Diagnosis not present

## 2021-04-04 DIAGNOSIS — M47812 Spondylosis without myelopathy or radiculopathy, cervical region: Secondary | ICD-10-CM | POA: Diagnosis not present

## 2021-04-04 DIAGNOSIS — S40012A Contusion of left shoulder, initial encounter: Secondary | ICD-10-CM | POA: Diagnosis not present

## 2021-04-06 DIAGNOSIS — N186 End stage renal disease: Secondary | ICD-10-CM | POA: Diagnosis not present

## 2021-04-06 DIAGNOSIS — D631 Anemia in chronic kidney disease: Secondary | ICD-10-CM | POA: Diagnosis not present

## 2021-04-06 DIAGNOSIS — Z992 Dependence on renal dialysis: Secondary | ICD-10-CM | POA: Diagnosis not present

## 2021-04-06 DIAGNOSIS — N2581 Secondary hyperparathyroidism of renal origin: Secondary | ICD-10-CM | POA: Diagnosis not present

## 2021-04-07 ENCOUNTER — Telehealth: Payer: Self-pay

## 2021-04-07 DIAGNOSIS — Z20822 Contact with and (suspected) exposure to covid-19: Secondary | ICD-10-CM | POA: Diagnosis not present

## 2021-04-07 NOTE — Telephone Encounter (Signed)
Patient's wife gave the office a call requesting that we give the pt a call.   I called pt and was unable to leave a voicemail. The phone rang and then the machine came on and said the phone is unable to receive any calls at this time. YL,RMA

## 2021-04-08 ENCOUNTER — Emergency Department (HOSPITAL_COMMUNITY): Payer: Medicare Other

## 2021-04-08 ENCOUNTER — Encounter (HOSPITAL_COMMUNITY): Payer: Self-pay | Admitting: *Deleted

## 2021-04-08 ENCOUNTER — Emergency Department (HOSPITAL_COMMUNITY)
Admission: EM | Admit: 2021-04-08 | Discharge: 2021-04-08 | Disposition: A | Discharge status: Home / Self Care | Payer: Medicare Other | Attending: Emergency Medicine | Admitting: Emergency Medicine

## 2021-04-08 ENCOUNTER — Other Ambulatory Visit: Payer: Self-pay

## 2021-04-08 DIAGNOSIS — I5043 Acute on chronic combined systolic (congestive) and diastolic (congestive) heart failure: Secondary | ICD-10-CM | POA: Diagnosis not present

## 2021-04-08 DIAGNOSIS — Z7901 Long term (current) use of anticoagulants: Secondary | ICD-10-CM | POA: Diagnosis not present

## 2021-04-08 DIAGNOSIS — I132 Hypertensive heart and chronic kidney disease with heart failure and with stage 5 chronic kidney disease, or end stage renal disease: Secondary | ICD-10-CM | POA: Insufficient documentation

## 2021-04-08 DIAGNOSIS — I480 Paroxysmal atrial fibrillation: Secondary | ICD-10-CM | POA: Insufficient documentation

## 2021-04-08 DIAGNOSIS — N2581 Secondary hyperparathyroidism of renal origin: Secondary | ICD-10-CM | POA: Diagnosis not present

## 2021-04-08 DIAGNOSIS — R531 Weakness: Secondary | ICD-10-CM | POA: Diagnosis not present

## 2021-04-08 DIAGNOSIS — R059 Cough, unspecified: Secondary | ICD-10-CM | POA: Insufficient documentation

## 2021-04-08 DIAGNOSIS — E1122 Type 2 diabetes mellitus with diabetic chronic kidney disease: Secondary | ICD-10-CM | POA: Diagnosis not present

## 2021-04-08 DIAGNOSIS — D689 Coagulation defect, unspecified: Secondary | ICD-10-CM | POA: Diagnosis not present

## 2021-04-08 DIAGNOSIS — I2510 Atherosclerotic heart disease of native coronary artery without angina pectoris: Secondary | ICD-10-CM | POA: Diagnosis not present

## 2021-04-08 DIAGNOSIS — Z992 Dependence on renal dialysis: Secondary | ICD-10-CM | POA: Insufficient documentation

## 2021-04-08 DIAGNOSIS — Z85520 Personal history of malignant carcinoid tumor of kidney: Secondary | ICD-10-CM | POA: Insufficient documentation

## 2021-04-08 DIAGNOSIS — R519 Headache, unspecified: Secondary | ICD-10-CM | POA: Insufficient documentation

## 2021-04-08 DIAGNOSIS — R42 Dizziness and giddiness: Secondary | ICD-10-CM | POA: Diagnosis not present

## 2021-04-08 DIAGNOSIS — Z79899 Other long term (current) drug therapy: Secondary | ICD-10-CM | POA: Diagnosis not present

## 2021-04-08 DIAGNOSIS — N186 End stage renal disease: Secondary | ICD-10-CM | POA: Insufficient documentation

## 2021-04-08 DIAGNOSIS — Z20822 Contact with and (suspected) exposure to covid-19: Secondary | ICD-10-CM | POA: Diagnosis not present

## 2021-04-08 DIAGNOSIS — D631 Anemia in chronic kidney disease: Secondary | ICD-10-CM | POA: Diagnosis not present

## 2021-04-08 LAB — CBC WITH DIFFERENTIAL/PLATELET
Abs Immature Granulocytes: 0.04 10*3/uL (ref 0.00–0.07)
Basophils Absolute: 0 10*3/uL (ref 0.0–0.1)
Basophils Relative: 1 %
Eosinophils Absolute: 0.2 10*3/uL (ref 0.0–0.5)
Eosinophils Relative: 3 %
HCT: 33.4 % — ABNORMAL LOW (ref 39.0–52.0)
Hemoglobin: 10.7 g/dL — ABNORMAL LOW (ref 13.0–17.0)
Immature Granulocytes: 1 %
Lymphocytes Relative: 19 %
Lymphs Abs: 1.4 10*3/uL (ref 0.7–4.0)
MCH: 31.1 pg (ref 26.0–34.0)
MCHC: 32 g/dL (ref 30.0–36.0)
MCV: 97.1 fL (ref 80.0–100.0)
Monocytes Absolute: 0.7 10*3/uL (ref 0.1–1.0)
Monocytes Relative: 10 %
Neutro Abs: 4.8 10*3/uL (ref 1.7–7.7)
Neutrophils Relative %: 66 %
Platelets: 201 10*3/uL (ref 150–400)
RBC: 3.44 MIL/uL — ABNORMAL LOW (ref 4.22–5.81)
RDW: 14.8 % (ref 11.5–15.5)
WBC: 7.2 10*3/uL (ref 4.0–10.5)
nRBC: 0 % (ref 0.0–0.2)

## 2021-04-08 LAB — COMPREHENSIVE METABOLIC PANEL
ALT: 13 U/L (ref 0–44)
AST: 12 U/L — ABNORMAL LOW (ref 15–41)
Albumin: 3.5 g/dL (ref 3.5–5.0)
Alkaline Phosphatase: 92 U/L (ref 38–126)
Anion gap: 12 (ref 5–15)
BUN: 17 mg/dL (ref 6–20)
CO2: 30 mmol/L (ref 22–32)
Calcium: 9.2 mg/dL (ref 8.9–10.3)
Chloride: 97 mmol/L — ABNORMAL LOW (ref 98–111)
Creatinine, Ser: 5.79 mg/dL — ABNORMAL HIGH (ref 0.61–1.24)
GFR, Estimated: 11 mL/min — ABNORMAL LOW (ref >60)
Glucose, Bld: 91 mg/dL (ref 70–99)
Potassium: 3.7 mmol/L (ref 3.5–5.1)
Sodium: 139 mmol/L (ref 135–145)
Total Bilirubin: 0.4 mg/dL (ref 0.3–1.2)
Total Protein: 8.5 g/dL — ABNORMAL HIGH (ref 6.5–8.1)

## 2021-04-08 LAB — MAGNESIUM: Magnesium: 2.2 mg/dL (ref 1.7–2.4)

## 2021-04-08 LAB — RESP PANEL BY RT-PCR (FLU A&B, COVID) ARPGX2
Influenza A by PCR: NEGATIVE
Influenza B by PCR: NEGATIVE
SARS Coronavirus 2 by RT PCR: NEGATIVE

## 2021-04-08 MED ORDER — MECLIZINE HCL 25 MG PO TABS
50.0000 mg | ORAL_TABLET | Freq: Once | ORAL | Status: AC
  Administered 2021-04-08: 50 mg via ORAL
  Filled 2021-04-08: qty 2

## 2021-04-08 MED ORDER — MECLIZINE HCL 25 MG PO TABS: 25.0000 mg | ORAL_TABLET | Freq: Three times a day (TID) | ORAL | 0 refills | Status: DC | PRN

## 2021-04-08 NOTE — ED Notes (Signed)
Patient transported to MRI 
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
## 2021-04-08 NOTE — Discharge Instructions (Signed)
You have been seen and discharged from the emergency department.  Your blood work was normal for you.  Your head CT and MRI of the brain showed no acute changes or findings of stroke.  I believe her symptoms are most likely related to vertigo.  Take meclizine as prescribed.  Stay well-hydrated.  Follow-up with your primary provider for reevaluation and further care. Take home medications as prescribed. If you have any worsening symptoms or further concerns for your health please return to an emergency department for further evaluation.

## 2021-04-08 NOTE — ED Triage Notes (Signed)
Dialysis pt, received full treatment pta. Was sent here due to weakness x 2 days with mild cough and headache. Reports falling yesterday, unsure if he hit his head but has spasms to both arms, difficult gripping objects bilateral, unsteady gait.

## 2021-04-08 NOTE — ED Provider Notes (Signed)
Emergency Medicine Provider Triage Evaluation Note  [name] , a 60 y.o. male  was evaluated in triage.  Pt complains of generalized weakness for the past few days.  Weakness associated with mild cough and headache.  Patient admits to falling yesterday.  He is unsure whether or not he hit his head.  Patient states he fell due to generalized weakness.  No LOC. ESRD patient on dialysis Monday/Wednesday/Friday.  Patient had dialysis prior to arrival.  Patient sent to the ED due to generalized weakness.  Patient denies unilateral weakness.  No speech changes or visual changes. Patient is currently on Warfarin  Review of Systems  Positive: weakness Negative:   Physical Exam  BP 117/81 (BP Location: Right Arm)   Pulse 83   Temp (!) 97.5 F (36.4 C) (Oral)   Resp 18   SpO2 100%  Gen:   Awake, no distress   Resp:  Normal effort  MSK:   Moves extremities without difficulty  Other:  Normal speech, equal grip strength bilaterally however appears difficult to grip bilaterally  Medical Decision Making  Medically screening exam initiated at 10:45 AM.  Appropriate orders placed.  [name] was informed that the remainder of the evaluation will be completed by another provider, this initial triage assessment does not replace that evaluation, and the importance of remaining in the ED until their evaluation is complete.  Routine labs CT head/cervical spine due to fall EKG COVID/influenza test Informed charge need for room due to fall on blood thinners; however patient unsure whether or not he hit his head. No focal deficits on exam.   Discussed with Dr. Dina Rich who agrees with assessment and plan.    Suzy Bouchard, PA-C 04/08/21 1050    Lorelle Gibbs, DO 04/08/21 1411

## 2021-04-10 DIAGNOSIS — N186 End stage renal disease: Secondary | ICD-10-CM | POA: Diagnosis not present

## 2021-04-10 DIAGNOSIS — Z992 Dependence on renal dialysis: Secondary | ICD-10-CM | POA: Diagnosis not present

## 2021-04-10 DIAGNOSIS — D631 Anemia in chronic kidney disease: Secondary | ICD-10-CM | POA: Diagnosis not present

## 2021-04-10 DIAGNOSIS — N2581 Secondary hyperparathyroidism of renal origin: Secondary | ICD-10-CM | POA: Diagnosis not present

## 2021-04-13 DIAGNOSIS — N186 End stage renal disease: Secondary | ICD-10-CM | POA: Diagnosis not present

## 2021-04-13 DIAGNOSIS — D631 Anemia in chronic kidney disease: Secondary | ICD-10-CM | POA: Diagnosis not present

## 2021-04-13 DIAGNOSIS — N2581 Secondary hyperparathyroidism of renal origin: Secondary | ICD-10-CM | POA: Diagnosis not present

## 2021-04-13 DIAGNOSIS — Z992 Dependence on renal dialysis: Secondary | ICD-10-CM | POA: Diagnosis not present

## 2021-04-14 ENCOUNTER — Encounter: Payer: Self-pay | Admitting: Podiatry

## 2021-04-14 ENCOUNTER — Other Ambulatory Visit: Payer: Self-pay

## 2021-04-14 ENCOUNTER — Ambulatory Visit (INDEPENDENT_AMBULATORY_CARE_PROVIDER_SITE_OTHER): Payer: Medicare Other | Admitting: Podiatry

## 2021-04-14 DIAGNOSIS — M25511 Pain in right shoulder: Secondary | ICD-10-CM | POA: Diagnosis not present

## 2021-04-14 DIAGNOSIS — N186 End stage renal disease: Secondary | ICD-10-CM

## 2021-04-14 DIAGNOSIS — Z7901 Long term (current) use of anticoagulants: Secondary | ICD-10-CM

## 2021-04-14 DIAGNOSIS — M79674 Pain in right toe(s): Secondary | ICD-10-CM

## 2021-04-14 DIAGNOSIS — B351 Tinea unguium: Secondary | ICD-10-CM

## 2021-04-14 DIAGNOSIS — M79675 Pain in left toe(s): Secondary | ICD-10-CM | POA: Diagnosis not present

## 2021-04-14 DIAGNOSIS — M25512 Pain in left shoulder: Secondary | ICD-10-CM | POA: Diagnosis not present

## 2021-04-14 DIAGNOSIS — E119 Type 2 diabetes mellitus without complications: Secondary | ICD-10-CM | POA: Diagnosis not present

## 2021-04-14 NOTE — Progress Notes (Signed)
This patient returns to my office for at risk foot care.  This patient requires this care by a professional since this patient will be at risk due to having DM, chronic anticoagulation and ESRD.  This patient is unable to cut nails himself since the patient cannot reach his nails.These nails are painful walking and wearing shoes.  This patient presents for at risk foot care today.  General Appearance  Alert, conversant and in no acute stress.  Vascular  Dorsalis pedis and posterior tibial  pulses are palpable  bilaterally.  Capillary return is within normal limits  bilaterally. Temperature is within normal limits  bilaterally.  Neurologic  Senn-Weinstein monofilament wire test within normal limits  bilaterally. Muscle power within normal limits bilaterally.  Nails Thick disfigured discolored nails with subungual debris  from hallux to fifth toes bilaterally. No evidence of bacterial infection or drainage bilaterally.  Orthopedic  No limitations of motion  feet .  No crepitus or effusions noted.  No bony pathology or digital deformities noted.  Skin  normotropic skin with no porokeratosis noted bilaterally.  No signs of infections or ulcers noted.     Onychomycosis  Pain in right toes  Pain in left toes  Consent was obtained for treatment procedures.   Mechanical debridement of nails 1-5  bilaterally performed with a nail nipper.  Filed with dremel without incident.    Return office visit     3 months                 Told patient to return for periodic foot care and evaluation due to potential at risk complications.   Aneli Zara DPM   

## 2021-04-15 DIAGNOSIS — D631 Anemia in chronic kidney disease: Secondary | ICD-10-CM | POA: Diagnosis not present

## 2021-04-15 DIAGNOSIS — N2581 Secondary hyperparathyroidism of renal origin: Secondary | ICD-10-CM | POA: Diagnosis not present

## 2021-04-15 DIAGNOSIS — D689 Coagulation defect, unspecified: Secondary | ICD-10-CM | POA: Diagnosis not present

## 2021-04-15 DIAGNOSIS — N186 End stage renal disease: Secondary | ICD-10-CM | POA: Diagnosis not present

## 2021-04-15 DIAGNOSIS — Z992 Dependence on renal dialysis: Secondary | ICD-10-CM | POA: Diagnosis not present

## 2021-04-18 DIAGNOSIS — N186 End stage renal disease: Secondary | ICD-10-CM | POA: Diagnosis not present

## 2021-04-18 DIAGNOSIS — Z992 Dependence on renal dialysis: Secondary | ICD-10-CM | POA: Diagnosis not present

## 2021-04-18 DIAGNOSIS — D631 Anemia in chronic kidney disease: Secondary | ICD-10-CM | POA: Diagnosis not present

## 2021-04-18 DIAGNOSIS — N2581 Secondary hyperparathyroidism of renal origin: Secondary | ICD-10-CM | POA: Diagnosis not present

## 2021-04-20 DIAGNOSIS — N2581 Secondary hyperparathyroidism of renal origin: Secondary | ICD-10-CM | POA: Diagnosis not present

## 2021-04-20 DIAGNOSIS — D631 Anemia in chronic kidney disease: Secondary | ICD-10-CM | POA: Diagnosis not present

## 2021-04-20 DIAGNOSIS — Z992 Dependence on renal dialysis: Secondary | ICD-10-CM | POA: Diagnosis not present

## 2021-04-20 DIAGNOSIS — N186 End stage renal disease: Secondary | ICD-10-CM | POA: Diagnosis not present

## 2021-04-22 DIAGNOSIS — D689 Coagulation defect, unspecified: Secondary | ICD-10-CM | POA: Diagnosis not present

## 2021-04-22 DIAGNOSIS — N186 End stage renal disease: Secondary | ICD-10-CM | POA: Diagnosis not present

## 2021-04-22 DIAGNOSIS — D631 Anemia in chronic kidney disease: Secondary | ICD-10-CM | POA: Diagnosis not present

## 2021-04-22 DIAGNOSIS — N2581 Secondary hyperparathyroidism of renal origin: Secondary | ICD-10-CM | POA: Diagnosis not present

## 2021-04-22 DIAGNOSIS — Z992 Dependence on renal dialysis: Secondary | ICD-10-CM | POA: Diagnosis not present

## 2021-04-23 DIAGNOSIS — N186 End stage renal disease: Secondary | ICD-10-CM | POA: Diagnosis not present

## 2021-04-23 DIAGNOSIS — Z992 Dependence on renal dialysis: Secondary | ICD-10-CM | POA: Diagnosis not present

## 2021-04-23 DIAGNOSIS — I120 Hypertensive chronic kidney disease with stage 5 chronic kidney disease or end stage renal disease: Secondary | ICD-10-CM | POA: Diagnosis not present

## 2021-04-24 DIAGNOSIS — N2581 Secondary hyperparathyroidism of renal origin: Secondary | ICD-10-CM | POA: Diagnosis not present

## 2021-04-24 DIAGNOSIS — Z992 Dependence on renal dialysis: Secondary | ICD-10-CM | POA: Diagnosis not present

## 2021-04-24 DIAGNOSIS — N186 End stage renal disease: Secondary | ICD-10-CM | POA: Diagnosis not present

## 2021-04-24 DIAGNOSIS — D631 Anemia in chronic kidney disease: Secondary | ICD-10-CM | POA: Diagnosis not present

## 2021-04-27 DIAGNOSIS — Z992 Dependence on renal dialysis: Secondary | ICD-10-CM | POA: Diagnosis not present

## 2021-04-27 DIAGNOSIS — N186 End stage renal disease: Secondary | ICD-10-CM | POA: Diagnosis not present

## 2021-04-27 DIAGNOSIS — D631 Anemia in chronic kidney disease: Secondary | ICD-10-CM | POA: Diagnosis not present

## 2021-04-27 DIAGNOSIS — N2581 Secondary hyperparathyroidism of renal origin: Secondary | ICD-10-CM | POA: Diagnosis not present

## 2021-04-29 DIAGNOSIS — N186 End stage renal disease: Secondary | ICD-10-CM | POA: Diagnosis not present

## 2021-04-29 DIAGNOSIS — N2581 Secondary hyperparathyroidism of renal origin: Secondary | ICD-10-CM | POA: Diagnosis not present

## 2021-04-29 DIAGNOSIS — D631 Anemia in chronic kidney disease: Secondary | ICD-10-CM | POA: Diagnosis not present

## 2021-04-29 DIAGNOSIS — I4892 Unspecified atrial flutter: Secondary | ICD-10-CM | POA: Diagnosis not present

## 2021-04-29 DIAGNOSIS — Z992 Dependence on renal dialysis: Secondary | ICD-10-CM | POA: Diagnosis not present

## 2021-04-30 DIAGNOSIS — M7541 Impingement syndrome of right shoulder: Secondary | ICD-10-CM | POA: Diagnosis not present

## 2021-04-30 DIAGNOSIS — M7542 Impingement syndrome of left shoulder: Secondary | ICD-10-CM | POA: Diagnosis not present

## 2021-05-01 DIAGNOSIS — D631 Anemia in chronic kidney disease: Secondary | ICD-10-CM | POA: Diagnosis not present

## 2021-05-01 DIAGNOSIS — Z992 Dependence on renal dialysis: Secondary | ICD-10-CM | POA: Diagnosis not present

## 2021-05-01 DIAGNOSIS — N2581 Secondary hyperparathyroidism of renal origin: Secondary | ICD-10-CM | POA: Diagnosis not present

## 2021-05-01 DIAGNOSIS — N186 End stage renal disease: Secondary | ICD-10-CM | POA: Diagnosis not present

## 2021-05-04 DIAGNOSIS — N186 End stage renal disease: Secondary | ICD-10-CM | POA: Diagnosis not present

## 2021-05-04 DIAGNOSIS — Z992 Dependence on renal dialysis: Secondary | ICD-10-CM | POA: Diagnosis not present

## 2021-05-04 DIAGNOSIS — D631 Anemia in chronic kidney disease: Secondary | ICD-10-CM | POA: Diagnosis not present

## 2021-05-04 DIAGNOSIS — N2581 Secondary hyperparathyroidism of renal origin: Secondary | ICD-10-CM | POA: Diagnosis not present

## 2021-05-05 ENCOUNTER — Other Ambulatory Visit: Payer: Self-pay

## 2021-05-05 ENCOUNTER — Ambulatory Visit (INDEPENDENT_AMBULATORY_CARE_PROVIDER_SITE_OTHER): Payer: Medicare Other | Admitting: Pulmonary Disease

## 2021-05-05 ENCOUNTER — Encounter: Payer: Self-pay | Admitting: Pulmonary Disease

## 2021-05-05 VITALS — BP 128/70 | HR 79 | Temp 98.1°F | Ht 75.0 in | Wt 243.0 lb

## 2021-05-05 DIAGNOSIS — J45991 Cough variant asthma: Secondary | ICD-10-CM | POA: Diagnosis not present

## 2021-05-05 DIAGNOSIS — R053 Chronic cough: Secondary | ICD-10-CM | POA: Diagnosis not present

## 2021-05-05 DIAGNOSIS — R0609 Other forms of dyspnea: Secondary | ICD-10-CM | POA: Diagnosis not present

## 2021-05-05 MED ORDER — FLUTICASONE FUROATE-VILANTEROL 100-25 MCG/ACT IN AEPB: 1.0000 | INHALATION_SPRAY | Freq: Every day | RESPIRATORY_TRACT | 6 refills | Status: DC
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
## 2021-05-05 NOTE — Patient Instructions (Signed)
Nice to meet you  Recommend using Breo 1 puff every day.  Rinse your mouth out after every use.  Your cough and symptoms seem consistent to me with cough variant asthma.  The Breo should help reduce the frequency of the cough.  Please call me if I can help in any way or if symptoms worsen.  Return to clinic in 6 months or sooner as needed with Dr. Silas Flood

## 2021-05-06 DIAGNOSIS — D689 Coagulation defect, unspecified: Secondary | ICD-10-CM | POA: Diagnosis not present

## 2021-05-06 DIAGNOSIS — N186 End stage renal disease: Secondary | ICD-10-CM | POA: Diagnosis not present

## 2021-05-06 DIAGNOSIS — D631 Anemia in chronic kidney disease: Secondary | ICD-10-CM | POA: Diagnosis not present

## 2021-05-06 DIAGNOSIS — N2581 Secondary hyperparathyroidism of renal origin: Secondary | ICD-10-CM | POA: Diagnosis not present

## 2021-05-06 DIAGNOSIS — Z992 Dependence on renal dialysis: Secondary | ICD-10-CM | POA: Diagnosis not present

## 2021-05-08 DIAGNOSIS — D631 Anemia in chronic kidney disease: Secondary | ICD-10-CM | POA: Diagnosis not present

## 2021-05-08 DIAGNOSIS — N2581 Secondary hyperparathyroidism of renal origin: Secondary | ICD-10-CM | POA: Diagnosis not present

## 2021-05-08 DIAGNOSIS — N186 End stage renal disease: Secondary | ICD-10-CM | POA: Diagnosis not present

## 2021-05-08 DIAGNOSIS — Z992 Dependence on renal dialysis: Secondary | ICD-10-CM | POA: Diagnosis not present

## 2021-05-11 DIAGNOSIS — Z992 Dependence on renal dialysis: Secondary | ICD-10-CM | POA: Diagnosis not present

## 2021-05-11 DIAGNOSIS — D631 Anemia in chronic kidney disease: Secondary | ICD-10-CM | POA: Diagnosis not present

## 2021-05-11 DIAGNOSIS — N2581 Secondary hyperparathyroidism of renal origin: Secondary | ICD-10-CM | POA: Diagnosis not present

## 2021-05-11 DIAGNOSIS — N186 End stage renal disease: Secondary | ICD-10-CM | POA: Diagnosis not present

## 2021-05-12 ENCOUNTER — Encounter: Payer: Self-pay | Admitting: Nurse Practitioner

## 2021-05-13 DIAGNOSIS — D631 Anemia in chronic kidney disease: Secondary | ICD-10-CM | POA: Diagnosis not present

## 2021-05-13 DIAGNOSIS — N186 End stage renal disease: Secondary | ICD-10-CM | POA: Diagnosis not present

## 2021-05-13 DIAGNOSIS — D689 Coagulation defect, unspecified: Secondary | ICD-10-CM | POA: Diagnosis not present

## 2021-05-13 DIAGNOSIS — Z992 Dependence on renal dialysis: Secondary | ICD-10-CM | POA: Diagnosis not present

## 2021-05-13 DIAGNOSIS — N2581 Secondary hyperparathyroidism of renal origin: Secondary | ICD-10-CM | POA: Diagnosis not present

## 2021-05-15 DIAGNOSIS — N186 End stage renal disease: Secondary | ICD-10-CM | POA: Diagnosis not present

## 2021-05-15 DIAGNOSIS — Z992 Dependence on renal dialysis: Secondary | ICD-10-CM | POA: Diagnosis not present

## 2021-05-15 DIAGNOSIS — D631 Anemia in chronic kidney disease: Secondary | ICD-10-CM | POA: Diagnosis not present

## 2021-05-15 DIAGNOSIS — N2581 Secondary hyperparathyroidism of renal origin: Secondary | ICD-10-CM | POA: Diagnosis not present

## 2021-05-18 DIAGNOSIS — D631 Anemia in chronic kidney disease: Secondary | ICD-10-CM | POA: Diagnosis not present

## 2021-05-18 DIAGNOSIS — Z992 Dependence on renal dialysis: Secondary | ICD-10-CM | POA: Diagnosis not present

## 2021-05-18 DIAGNOSIS — N186 End stage renal disease: Secondary | ICD-10-CM | POA: Diagnosis not present

## 2021-05-18 DIAGNOSIS — N2581 Secondary hyperparathyroidism of renal origin: Secondary | ICD-10-CM | POA: Diagnosis not present

## 2021-05-19 DIAGNOSIS — M25512 Pain in left shoulder: Secondary | ICD-10-CM | POA: Diagnosis not present

## 2021-05-19 DIAGNOSIS — M25511 Pain in right shoulder: Secondary | ICD-10-CM | POA: Diagnosis not present

## 2021-05-20 DIAGNOSIS — D631 Anemia in chronic kidney disease: Secondary | ICD-10-CM | POA: Diagnosis not present

## 2021-05-20 DIAGNOSIS — N186 End stage renal disease: Secondary | ICD-10-CM | POA: Diagnosis not present

## 2021-05-20 DIAGNOSIS — N2581 Secondary hyperparathyroidism of renal origin: Secondary | ICD-10-CM | POA: Diagnosis not present

## 2021-05-20 DIAGNOSIS — Z992 Dependence on renal dialysis: Secondary | ICD-10-CM | POA: Diagnosis not present

## 2021-05-20 DIAGNOSIS — D689 Coagulation defect, unspecified: Secondary | ICD-10-CM | POA: Diagnosis not present

## 2021-05-22 DIAGNOSIS — N2581 Secondary hyperparathyroidism of renal origin: Secondary | ICD-10-CM | POA: Diagnosis not present

## 2021-05-22 DIAGNOSIS — D631 Anemia in chronic kidney disease: Secondary | ICD-10-CM | POA: Diagnosis not present

## 2021-05-22 DIAGNOSIS — N186 End stage renal disease: Secondary | ICD-10-CM | POA: Diagnosis not present

## 2021-05-22 DIAGNOSIS — Z992 Dependence on renal dialysis: Secondary | ICD-10-CM | POA: Diagnosis not present

## 2021-05-24 DIAGNOSIS — N186 End stage renal disease: Secondary | ICD-10-CM | POA: Diagnosis not present

## 2021-05-24 DIAGNOSIS — Z992 Dependence on renal dialysis: Secondary | ICD-10-CM | POA: Diagnosis not present

## 2021-05-24 DIAGNOSIS — I120 Hypertensive chronic kidney disease with stage 5 chronic kidney disease or end stage renal disease: Secondary | ICD-10-CM | POA: Diagnosis not present

## 2021-05-25 DIAGNOSIS — D631 Anemia in chronic kidney disease: Secondary | ICD-10-CM | POA: Diagnosis not present

## 2021-05-25 DIAGNOSIS — D509 Iron deficiency anemia, unspecified: Secondary | ICD-10-CM | POA: Diagnosis not present

## 2021-05-25 DIAGNOSIS — Z992 Dependence on renal dialysis: Secondary | ICD-10-CM | POA: Diagnosis not present

## 2021-05-25 DIAGNOSIS — N186 End stage renal disease: Secondary | ICD-10-CM | POA: Diagnosis not present

## 2021-05-25 DIAGNOSIS — N2581 Secondary hyperparathyroidism of renal origin: Secondary | ICD-10-CM | POA: Diagnosis not present

## 2021-05-26 ENCOUNTER — Telehealth: Payer: Self-pay

## 2021-05-26 NOTE — Telephone Encounter (Signed)
The pt was notified to contact his pulmonologist to let them know that he needed a refill on his cough syrup.

## 2021-05-27 ENCOUNTER — Telehealth: Payer: Self-pay | Admitting: Pulmonary Disease

## 2021-05-27 DIAGNOSIS — D631 Anemia in chronic kidney disease: Secondary | ICD-10-CM | POA: Diagnosis not present

## 2021-05-27 DIAGNOSIS — Z992 Dependence on renal dialysis: Secondary | ICD-10-CM | POA: Diagnosis not present

## 2021-05-27 DIAGNOSIS — Z20822 Contact with and (suspected) exposure to covid-19: Secondary | ICD-10-CM | POA: Diagnosis not present

## 2021-05-27 DIAGNOSIS — N2581 Secondary hyperparathyroidism of renal origin: Secondary | ICD-10-CM | POA: Diagnosis not present

## 2021-05-27 DIAGNOSIS — N186 End stage renal disease: Secondary | ICD-10-CM | POA: Diagnosis not present

## 2021-05-27 DIAGNOSIS — D509 Iron deficiency anemia, unspecified: Secondary | ICD-10-CM | POA: Diagnosis not present

## 2021-05-27 DIAGNOSIS — D689 Coagulation defect, unspecified: Secondary | ICD-10-CM | POA: Diagnosis not present

## 2021-05-28 DIAGNOSIS — M7541 Impingement syndrome of right shoulder: Secondary | ICD-10-CM | POA: Diagnosis not present

## 2021-05-28 NOTE — Telephone Encounter (Signed)
ATC patient- unable to leave vm due to mailbox being full.   

## 2021-05-29 DIAGNOSIS — Z992 Dependence on renal dialysis: Secondary | ICD-10-CM | POA: Diagnosis not present

## 2021-05-29 DIAGNOSIS — N2581 Secondary hyperparathyroidism of renal origin: Secondary | ICD-10-CM | POA: Diagnosis not present

## 2021-05-29 DIAGNOSIS — D631 Anemia in chronic kidney disease: Secondary | ICD-10-CM | POA: Diagnosis not present

## 2021-05-29 DIAGNOSIS — N186 End stage renal disease: Secondary | ICD-10-CM | POA: Diagnosis not present

## 2021-05-29 DIAGNOSIS — D509 Iron deficiency anemia, unspecified: Secondary | ICD-10-CM | POA: Diagnosis not present

## 2021-06-01 DIAGNOSIS — D631 Anemia in chronic kidney disease: Secondary | ICD-10-CM | POA: Diagnosis not present

## 2021-06-01 DIAGNOSIS — I4892 Unspecified atrial flutter: Secondary | ICD-10-CM | POA: Diagnosis not present

## 2021-06-01 DIAGNOSIS — E1129 Type 2 diabetes mellitus with other diabetic kidney complication: Secondary | ICD-10-CM | POA: Diagnosis not present

## 2021-06-01 DIAGNOSIS — Z992 Dependence on renal dialysis: Secondary | ICD-10-CM | POA: Diagnosis not present

## 2021-06-01 DIAGNOSIS — N2581 Secondary hyperparathyroidism of renal origin: Secondary | ICD-10-CM | POA: Diagnosis not present

## 2021-06-01 DIAGNOSIS — D509 Iron deficiency anemia, unspecified: Secondary | ICD-10-CM | POA: Diagnosis not present

## 2021-06-01 DIAGNOSIS — N186 End stage renal disease: Secondary | ICD-10-CM | POA: Diagnosis not present

## 2021-06-03 DIAGNOSIS — Z992 Dependence on renal dialysis: Secondary | ICD-10-CM | POA: Diagnosis not present

## 2021-06-03 DIAGNOSIS — N186 End stage renal disease: Secondary | ICD-10-CM | POA: Diagnosis not present

## 2021-06-03 DIAGNOSIS — D631 Anemia in chronic kidney disease: Secondary | ICD-10-CM | POA: Diagnosis not present

## 2021-06-03 DIAGNOSIS — N2581 Secondary hyperparathyroidism of renal origin: Secondary | ICD-10-CM | POA: Diagnosis not present

## 2021-06-03 DIAGNOSIS — D509 Iron deficiency anemia, unspecified: Secondary | ICD-10-CM | POA: Diagnosis not present

## 2021-06-05 DIAGNOSIS — D631 Anemia in chronic kidney disease: Secondary | ICD-10-CM | POA: Diagnosis not present

## 2021-06-05 DIAGNOSIS — N2581 Secondary hyperparathyroidism of renal origin: Secondary | ICD-10-CM | POA: Diagnosis not present

## 2021-06-05 DIAGNOSIS — N186 End stage renal disease: Secondary | ICD-10-CM | POA: Diagnosis not present

## 2021-06-05 DIAGNOSIS — D509 Iron deficiency anemia, unspecified: Secondary | ICD-10-CM | POA: Diagnosis not present

## 2021-06-05 DIAGNOSIS — Z992 Dependence on renal dialysis: Secondary | ICD-10-CM | POA: Diagnosis not present

## 2021-06-08 ENCOUNTER — Telehealth: Payer: Self-pay

## 2021-06-08 DIAGNOSIS — I5022 Chronic systolic (congestive) heart failure: Secondary | ICD-10-CM | POA: Diagnosis not present

## 2021-06-08 DIAGNOSIS — N2581 Secondary hyperparathyroidism of renal origin: Secondary | ICD-10-CM | POA: Diagnosis not present

## 2021-06-08 DIAGNOSIS — D631 Anemia in chronic kidney disease: Secondary | ICD-10-CM | POA: Diagnosis not present

## 2021-06-08 DIAGNOSIS — N186 End stage renal disease: Secondary | ICD-10-CM | POA: Diagnosis not present

## 2021-06-08 DIAGNOSIS — D689 Coagulation defect, unspecified: Secondary | ICD-10-CM | POA: Diagnosis not present

## 2021-06-08 DIAGNOSIS — D509 Iron deficiency anemia, unspecified: Secondary | ICD-10-CM | POA: Diagnosis not present

## 2021-06-08 DIAGNOSIS — Z992 Dependence on renal dialysis: Secondary | ICD-10-CM | POA: Diagnosis not present

## 2021-06-08 DIAGNOSIS — R072 Precordial pain: Secondary | ICD-10-CM | POA: Diagnosis not present

## 2021-06-08 DIAGNOSIS — I2510 Atherosclerotic heart disease of native coronary artery without angina pectoris: Secondary | ICD-10-CM | POA: Diagnosis not present

## 2021-06-08 NOTE — Progress Notes (Signed)
Chronic Care Management Pharmacy Assistant   Name: [name]  MRN: [id number] DOB: 02/17/1961  Reason for Encounter: Disease State/Hypertension Adherence Call    Recent office visits: None    Recent consult visits:   05/05/2021 Larey Days MD [facility] Pulmonary)   04/14/2021 Gardiner Barefoot [facility] visits:  Medication Reconciliation was completed by comparing discharge summary, patients EMR and Pharmacy list, and upon discussion with patient.   [facility]  on 04/08/2021 due to Dizziness and Weakness. Discharge date was 04/08/2021.    New?Medications Started at [facility] Discharge:?Meclizine 25 mg oral 3 times daily prn started 04/08/2021 due to Dizziness and Weakness  Medication Changes at Hospital Discharge: None  Medications Discontinued at Hospital Discharge: None  Medications that remain the same after Hospital Discharge:??  -All other medications will remain the same.    Medications: Outpatient Encounter Medications as of 06/08/2021  Medication Sig Note   albuterol (VENTOLIN HFA) 108 (90 Base) MCG/ACT inhaler Inhale 2 puffs into the lungs every 6 (six) hours as needed for wheezing or shortness of breath.    amiodarone (PACERONE) 200 MG tablet Take 1 tablet (200 mg total) by mouth 2 (two) times daily.    calcium acetate (PHOSLO) 667 MG capsule Take 1-3 capsules (667-2,001 mg total) by mouth See admin instructions. Take 2001 mg capsules with meals three times daily and 667 mg capsule with snacks. (Patient taking differently: Take 2,001 mg by mouth daily.)    chlorhexidine (PERIDEX) 0.12 % solution 15 mLs by Mouth Rinse route 2 (two) times daily.    cinacalcet (SENSIPAR) 90 MG tablet Take 180 mg by mouth daily.    Control Gel Formula Dressing (DUODERM CGF EXTRA THIN) MISC Apply 1 each topically daily as needed.    diclofenac Sodium (VOLTAREN) 1 % GEL Apply 2 g topically 4 (four) times daily.    digoxin (LANOXIN) 0.125 MG  tablet Take 1 tablet (0.125 mg total) by mouth every Monday, Wednesday, and Friday.    ferric citrate (AURYXIA) 1 GM 210 MG(Fe) tablet Take 630 mg by mouth 3 (three) times daily with meals.    fluticasone furoate-vilanterol (BREO ELLIPTA) 100-25 MCG/ACT AEPB Inhale 1 puff into the lungs daily.    gabapentin (NEURONTIN) 100 MG capsule Take 1 capsule (100 mg total) by mouth 3 (three) times daily as needed (coughing uncontrollably).    HYDROcodone bit-homatropine (HYDROMET) 5-1.5 MG/5ML syrup Take 5 mLs by mouth every 6 (six) hours as needed for cough.    ipratropium (ATROVENT) 0.06 % nasal spray Place 2 sprays into both nostrils 4 (four) times daily. (Patient taking differently: Place 2 sprays into both nostrils 4 (four) times daily as needed for rhinitis.)    Lactobacillus (ACIDOPHILUS/BIFIDUS PO) Take 1 capsule by mouth daily. Written by Dr. Collene Mares to take once per day.    lidocaine-prilocaine (EMLA) cream Apply 1 application topically See admin instructions. Apply topically to port access prior to dialysis - Monday, Wednesday, Friday.    loperamide (IMODIUM A-D) 2 MG tablet Take 1 tablet (2 mg total) by mouth 4 (four) times daily as needed for diarrhea or loose stools. Also available OTC.    meclizine (ANTIVERT) 25 MG tablet Take 1 tablet (25 mg total) by mouth 3 (three) times daily as needed for dizziness.    metoprolol succinate (TOPROL-XL) 25 MG 24 hr tablet Take 1 tablet (25 mg total) by mouth daily.    metoprolol tartrate (LOPRESSOR) 25 MG tablet Take 25 mg  by mouth 2 (two) times daily.    midodrine (PROAMATINE) 10 MG tablet Take 1 tablet (10 mg total) by mouth 3 (three) times daily.    multivitamin (RENA-VIT) TABS tablet Take 1 tablet by mouth at bedtime.    nitroGLYCERIN (NITROSTAT) 0.4 MG SL tablet Place 0.4 mg under the tongue every 5 (five) minutes as needed for chest pain.     Nutritional Supplements (FEEDING SUPPLEMENT, NEPRO CARB STEADY,) LIQD Take 237 mLs by mouth daily.    ondansetron  (ZOFRAN ODT) 4 MG disintegrating tablet Take 1 tablet (4 mg total) by mouth every 8 (eight) hours as needed for nausea or vomiting.    oxyCODONE-acetaminophen (PERCOCET/ROXICET) 5-325 MG tablet Take 1 tablet by mouth every 4 (four) hours as needed for severe pain.    pantoprazole (PROTONIX) 40 MG tablet Take 40 mg by mouth 2 (two) times daily.    sucroferric oxyhydroxide (VELPHORO) 500 MG chewable tablet Chew 500-1,000 mg by mouth See admin instructions. Takes 2 tablets with each meal and 1 tablet with snacks.    traMADol (ULTRAM) 50 MG tablet Take by mouth.    UNABLE TO FIND Out patient physical therapy- vestibular therapy for BPPV    warfarin (COUMADIN) 7.5 MG tablet Take 7.5 mg by mouth daily. 01/29/2021: Taking 1 and 1/2 tablets once per day at 6 PM. Does INR drawing at dialysis and give results to the heart doctor on Friday, once per week.    No facility-administered encounter medications on file as of 06/08/2021.    Recent Office Vitals: BP Readings from Last 3 Encounters:  05/05/21 128/70  04/08/21 114/80  12/25/20 108/64   Pulse Readings from Last 3 Encounters:  05/05/21 79  04/08/21 84  12/25/20 (!) 103    Wt Readings from Last 3 Encounters:  05/05/21 243 lb (110.2 kg)  12/25/20 237 lb 6.4 oz (107.7 kg)  11/27/20 238 lb 6.4 oz (108.1 kg)     Kidney Function Lab Results  Component Value Date/Time   CREATININE 5.79 (H) 04/08/2021 10:53 AM   CREATININE 7.15 (H) 06/07/2020 03:57 AM   GFRNONAA 11 (L) 04/08/2021 10:53 AM   GFRAA 6 (L) 11/15/2019 10:12 AM    BMP Latest Ref Rng & Units 04/08/2021 08/13/2020 06/07/2020  Glucose 70 - 99 mg/dL 91 - 87  BUN 6 - 20 mg/dL 17 - 23(H)  Creatinine 0.61 - 1.24 mg/dL 5.79(H) - 7.15(H)  BUN/Creat Ratio 9 - 20 - - -  Sodium 135 - 145 mmol/L 139 - 135  Potassium 3.5 - 5.1 mmol/L 3.7 - 4.3  Chloride 98 - 111 mmol/L 97(L) - 95(L)  CO2 22 - 32 mmol/L 30 - 25  Calcium 8.9 - 10.3 mg/dL 9.2 10.3 8.6(L)   Called patient left message  06/08/2021 Called patient left message 06/09/2021 Called patient left message 06/10/2021  Care Gaps: Last annual wellness visit- 12/25/2020   Star Rating Drugs:  Medication:  Last Fill: Day Supply   None Noted   [place] Pharmacist Assistant [phone]

## 2021-06-10 DIAGNOSIS — N186 End stage renal disease: Secondary | ICD-10-CM | POA: Diagnosis not present

## 2021-06-10 DIAGNOSIS — D509 Iron deficiency anemia, unspecified: Secondary | ICD-10-CM | POA: Diagnosis not present

## 2021-06-10 DIAGNOSIS — N2581 Secondary hyperparathyroidism of renal origin: Secondary | ICD-10-CM | POA: Diagnosis not present

## 2021-06-10 DIAGNOSIS — Z992 Dependence on renal dialysis: Secondary | ICD-10-CM | POA: Diagnosis not present

## 2021-06-10 DIAGNOSIS — D631 Anemia in chronic kidney disease: Secondary | ICD-10-CM | POA: Diagnosis not present

## 2021-06-12 DIAGNOSIS — D509 Iron deficiency anemia, unspecified: Secondary | ICD-10-CM | POA: Diagnosis not present

## 2021-06-12 DIAGNOSIS — N186 End stage renal disease: Secondary | ICD-10-CM | POA: Diagnosis not present

## 2021-06-12 DIAGNOSIS — D631 Anemia in chronic kidney disease: Secondary | ICD-10-CM | POA: Diagnosis not present

## 2021-06-12 DIAGNOSIS — Z992 Dependence on renal dialysis: Secondary | ICD-10-CM | POA: Diagnosis not present

## 2021-06-12 DIAGNOSIS — N2581 Secondary hyperparathyroidism of renal origin: Secondary | ICD-10-CM | POA: Diagnosis not present

## 2021-06-15 DIAGNOSIS — D509 Iron deficiency anemia, unspecified: Secondary | ICD-10-CM | POA: Diagnosis not present

## 2021-06-15 DIAGNOSIS — Z992 Dependence on renal dialysis: Secondary | ICD-10-CM | POA: Diagnosis not present

## 2021-06-15 DIAGNOSIS — D631 Anemia in chronic kidney disease: Secondary | ICD-10-CM | POA: Diagnosis not present

## 2021-06-15 DIAGNOSIS — D689 Coagulation defect, unspecified: Secondary | ICD-10-CM | POA: Diagnosis not present

## 2021-06-15 DIAGNOSIS — N2581 Secondary hyperparathyroidism of renal origin: Secondary | ICD-10-CM | POA: Diagnosis not present

## 2021-06-15 DIAGNOSIS — N186 End stage renal disease: Secondary | ICD-10-CM | POA: Diagnosis not present

## 2021-06-17 DIAGNOSIS — N2581 Secondary hyperparathyroidism of renal origin: Secondary | ICD-10-CM | POA: Diagnosis not present

## 2021-06-17 DIAGNOSIS — N186 End stage renal disease: Secondary | ICD-10-CM | POA: Diagnosis not present

## 2021-06-17 DIAGNOSIS — Z992 Dependence on renal dialysis: Secondary | ICD-10-CM | POA: Diagnosis not present

## 2021-06-17 DIAGNOSIS — D631 Anemia in chronic kidney disease: Secondary | ICD-10-CM | POA: Diagnosis not present

## 2021-06-17 DIAGNOSIS — D509 Iron deficiency anemia, unspecified: Secondary | ICD-10-CM | POA: Diagnosis not present

## 2021-06-19 DIAGNOSIS — N2581 Secondary hyperparathyroidism of renal origin: Secondary | ICD-10-CM | POA: Diagnosis not present

## 2021-06-19 DIAGNOSIS — N186 End stage renal disease: Secondary | ICD-10-CM | POA: Diagnosis not present

## 2021-06-19 DIAGNOSIS — D509 Iron deficiency anemia, unspecified: Secondary | ICD-10-CM | POA: Diagnosis not present

## 2021-06-19 DIAGNOSIS — D631 Anemia in chronic kidney disease: Secondary | ICD-10-CM | POA: Diagnosis not present

## 2021-06-19 DIAGNOSIS — Z992 Dependence on renal dialysis: Secondary | ICD-10-CM | POA: Diagnosis not present

## 2021-06-22 DIAGNOSIS — Z992 Dependence on renal dialysis: Secondary | ICD-10-CM | POA: Diagnosis not present

## 2021-06-22 DIAGNOSIS — D631 Anemia in chronic kidney disease: Secondary | ICD-10-CM | POA: Diagnosis not present

## 2021-06-22 DIAGNOSIS — D509 Iron deficiency anemia, unspecified: Secondary | ICD-10-CM | POA: Diagnosis not present

## 2021-06-22 DIAGNOSIS — D689 Coagulation defect, unspecified: Secondary | ICD-10-CM | POA: Diagnosis not present

## 2021-06-22 DIAGNOSIS — N186 End stage renal disease: Secondary | ICD-10-CM | POA: Diagnosis not present

## 2021-06-22 DIAGNOSIS — N2581 Secondary hyperparathyroidism of renal origin: Secondary | ICD-10-CM | POA: Diagnosis not present

## 2021-06-24 DIAGNOSIS — N186 End stage renal disease: Secondary | ICD-10-CM | POA: Diagnosis not present

## 2021-06-24 DIAGNOSIS — I120 Hypertensive chronic kidney disease with stage 5 chronic kidney disease or end stage renal disease: Secondary | ICD-10-CM | POA: Diagnosis not present

## 2021-06-24 DIAGNOSIS — D631 Anemia in chronic kidney disease: Secondary | ICD-10-CM | POA: Diagnosis not present

## 2021-06-24 DIAGNOSIS — Z992 Dependence on renal dialysis: Secondary | ICD-10-CM | POA: Diagnosis not present

## 2021-06-24 DIAGNOSIS — E1129 Type 2 diabetes mellitus with other diabetic kidney complication: Secondary | ICD-10-CM | POA: Diagnosis not present

## 2021-06-24 DIAGNOSIS — Z20822 Contact with and (suspected) exposure to covid-19: Secondary | ICD-10-CM | POA: Diagnosis not present

## 2021-06-24 DIAGNOSIS — N2581 Secondary hyperparathyroidism of renal origin: Secondary | ICD-10-CM | POA: Diagnosis not present

## 2021-06-24 DIAGNOSIS — D509 Iron deficiency anemia, unspecified: Secondary | ICD-10-CM | POA: Diagnosis not present

## 2021-06-26 DIAGNOSIS — Z20822 Contact with and (suspected) exposure to covid-19: Secondary | ICD-10-CM | POA: Diagnosis not present

## 2021-06-26 DIAGNOSIS — Z992 Dependence on renal dialysis: Secondary | ICD-10-CM | POA: Diagnosis not present

## 2021-06-26 DIAGNOSIS — D631 Anemia in chronic kidney disease: Secondary | ICD-10-CM | POA: Diagnosis not present

## 2021-06-26 DIAGNOSIS — N2581 Secondary hyperparathyroidism of renal origin: Secondary | ICD-10-CM | POA: Diagnosis not present

## 2021-06-26 DIAGNOSIS — D509 Iron deficiency anemia, unspecified: Secondary | ICD-10-CM | POA: Diagnosis not present

## 2021-06-26 DIAGNOSIS — N186 End stage renal disease: Secondary | ICD-10-CM | POA: Diagnosis not present

## 2021-06-29 DIAGNOSIS — Z992 Dependence on renal dialysis: Secondary | ICD-10-CM | POA: Diagnosis not present

## 2021-06-29 DIAGNOSIS — N186 End stage renal disease: Secondary | ICD-10-CM | POA: Diagnosis not present

## 2021-06-29 DIAGNOSIS — D631 Anemia in chronic kidney disease: Secondary | ICD-10-CM | POA: Diagnosis not present

## 2021-06-29 DIAGNOSIS — D509 Iron deficiency anemia, unspecified: Secondary | ICD-10-CM | POA: Diagnosis not present

## 2021-06-29 DIAGNOSIS — D689 Coagulation defect, unspecified: Secondary | ICD-10-CM | POA: Diagnosis not present

## 2021-06-29 DIAGNOSIS — N2581 Secondary hyperparathyroidism of renal origin: Secondary | ICD-10-CM | POA: Diagnosis not present

## 2021-06-30 DIAGNOSIS — M25511 Pain in right shoulder: Secondary | ICD-10-CM | POA: Diagnosis not present

## 2021-06-30 DIAGNOSIS — M25512 Pain in left shoulder: Secondary | ICD-10-CM | POA: Diagnosis not present

## 2021-07-01 DIAGNOSIS — Z992 Dependence on renal dialysis: Secondary | ICD-10-CM | POA: Diagnosis not present

## 2021-07-01 DIAGNOSIS — D631 Anemia in chronic kidney disease: Secondary | ICD-10-CM | POA: Diagnosis not present

## 2021-07-01 DIAGNOSIS — D509 Iron deficiency anemia, unspecified: Secondary | ICD-10-CM | POA: Diagnosis not present

## 2021-07-01 DIAGNOSIS — N2581 Secondary hyperparathyroidism of renal origin: Secondary | ICD-10-CM | POA: Diagnosis not present

## 2021-07-01 DIAGNOSIS — N186 End stage renal disease: Secondary | ICD-10-CM | POA: Diagnosis not present

## 2021-07-03 DIAGNOSIS — D509 Iron deficiency anemia, unspecified: Secondary | ICD-10-CM | POA: Diagnosis not present

## 2021-07-03 DIAGNOSIS — Z992 Dependence on renal dialysis: Secondary | ICD-10-CM | POA: Diagnosis not present

## 2021-07-03 DIAGNOSIS — N186 End stage renal disease: Secondary | ICD-10-CM | POA: Diagnosis not present

## 2021-07-03 DIAGNOSIS — N2581 Secondary hyperparathyroidism of renal origin: Secondary | ICD-10-CM | POA: Diagnosis not present

## 2021-07-03 DIAGNOSIS — D631 Anemia in chronic kidney disease: Secondary | ICD-10-CM | POA: Diagnosis not present

## 2021-07-06 DIAGNOSIS — D509 Iron deficiency anemia, unspecified: Secondary | ICD-10-CM | POA: Diagnosis not present

## 2021-07-06 DIAGNOSIS — N2581 Secondary hyperparathyroidism of renal origin: Secondary | ICD-10-CM | POA: Diagnosis not present

## 2021-07-06 DIAGNOSIS — Z992 Dependence on renal dialysis: Secondary | ICD-10-CM | POA: Diagnosis not present

## 2021-07-06 DIAGNOSIS — N186 End stage renal disease: Secondary | ICD-10-CM | POA: Diagnosis not present

## 2021-07-06 DIAGNOSIS — D631 Anemia in chronic kidney disease: Secondary | ICD-10-CM | POA: Diagnosis not present

## 2021-07-06 DIAGNOSIS — D689 Coagulation defect, unspecified: Secondary | ICD-10-CM | POA: Diagnosis not present

## 2021-07-07 ENCOUNTER — Encounter: Payer: Self-pay | Admitting: Nurse Practitioner

## 2021-07-07 ENCOUNTER — Other Ambulatory Visit: Payer: Self-pay

## 2021-07-07 ENCOUNTER — Ambulatory Visit (INDEPENDENT_AMBULATORY_CARE_PROVIDER_SITE_OTHER): Payer: Medicare Other | Admitting: Nurse Practitioner

## 2021-07-07 VITALS — BP 102/72 | HR 85 | Temp 98.2°F | Ht 75.0 in | Wt 243.0 lb

## 2021-07-07 DIAGNOSIS — I10 Essential (primary) hypertension: Secondary | ICD-10-CM

## 2021-07-07 DIAGNOSIS — E1122 Type 2 diabetes mellitus with diabetic chronic kidney disease: Secondary | ICD-10-CM | POA: Diagnosis not present

## 2021-07-07 DIAGNOSIS — Z6830 Body mass index (BMI) 30.0-30.9, adult: Secondary | ICD-10-CM | POA: Diagnosis not present

## 2021-07-07 DIAGNOSIS — G8929 Other chronic pain: Secondary | ICD-10-CM

## 2021-07-07 DIAGNOSIS — E6609 Other obesity due to excess calories: Secondary | ICD-10-CM

## 2021-07-07 DIAGNOSIS — Z992 Dependence on renal dialysis: Secondary | ICD-10-CM | POA: Diagnosis not present

## 2021-07-07 DIAGNOSIS — I120 Hypertensive chronic kidney disease with stage 5 chronic kidney disease or end stage renal disease: Secondary | ICD-10-CM | POA: Diagnosis not present

## 2021-07-07 DIAGNOSIS — N186 End stage renal disease: Secondary | ICD-10-CM

## 2021-07-07 DIAGNOSIS — M25512 Pain in left shoulder: Secondary | ICD-10-CM | POA: Diagnosis not present

## 2021-07-07 DIAGNOSIS — I132 Hypertensive heart and chronic kidney disease with heart failure and with stage 5 chronic kidney disease, or end stage renal disease: Secondary | ICD-10-CM

## 2021-07-07 DIAGNOSIS — M25511 Pain in right shoulder: Secondary | ICD-10-CM | POA: Diagnosis not present

## 2021-07-07 DIAGNOSIS — E782 Mixed hyperlipidemia: Secondary | ICD-10-CM

## 2021-07-07 DIAGNOSIS — Z79899 Other long term (current) drug therapy: Secondary | ICD-10-CM

## 2021-07-07 DIAGNOSIS — I25119 Atherosclerotic heart disease of native coronary artery with unspecified angina pectoris: Secondary | ICD-10-CM

## 2021-07-07 NOTE — Patient Instructions (Signed)
Type 2 Diabetes Mellitus, Diagnosis, Adult °Type 2 diabetes (type 2 diabetes mellitus) is a long-term (chronic) disease. It may happen when there is one or both of these problems: °The pancreas does not make enough insulin. °The body does not react in a normal way to insulin that it makes. °Insulin lets sugars go into cells in your body. If you have type 2 diabetes, sugars cannot get into your cells. Sugars build up in the blood. This causes high blood sugar. °What are the causes? °The exact cause of this condition is not known. °What increases the risk? °Having type 2 diabetes in your family. °Being overweight or very overweight. °Not being active. °Your body not reacting in a normal way to the insulin it makes. °Having higher than normal blood sugar over time. °Having a type of diabetes when you were pregnant. °Having a condition that causes small fluid-filled sacs on your ovaries. °What are the signs or symptoms? °At first, you may have no symptoms. You will get symptoms slowly. They may include: °More thirst than normal. °More hunger than normal. °Needing to pee more than normal. °Losing weight without trying. °Feeling tired. °Feeling weak. °Seeing things blurry. °Dark patches on your skin. °How is this treated? °This condition may be treated by a diabetes expert. You may need to: °Follow an eating plan made by a food expert (dietitian). °Get regular exercise. °Find ways to deal with stress. °Check blood sugar as often as told. °Take medicines. °Your doctor will set treatment goals for you. Your blood sugar should be at these levels: °Before meals: 80-130 mg/dL (4.4-7.2 mmol/L). °After meals: below 180 mg/dL (10 mmol/L). °Over the last 2-3 months: less than 7%. °Follow these instructions at home: °Medicines °Take your diabetes medicines or insulin every day. °Take medicines as told to help you prevent other problems caused by this condition. You may need: °Aspirin. °Medicine to lower cholesterol. °Medicine to  control blood pressure. °Questions to ask your doctor °Should I meet with a diabetes educator? °What medicines do I need, and when should I take them? °What will I need to treat my condition at home? °When should I check my blood sugar? °Where can I find a support group? °Who can I call if I have questions? °When is my next doctor visit? °General instructions °Take over-the-counter and prescription medicines only as told by your doctor. °Keep all follow-up visits. °Where to find more information °For help and guidance and more information about diabetes, please go to: °American Diabetes Association (ADA): www.diabetes.org °American Association of Diabetes Care and Education Specialists (ADCES): www.diabeteseducator.org °International Diabetes Federation (IDF): www.idf.org °Contact a doctor if: °Your blood sugar is at or above 240 mg/dL (13.3 mmol/L) for 2 days in a row. °You have been sick for 2 days or more, and you are not getting better. °You have had a fever for 2 days or more, and you are not getting better. °You have any of these problems for more than 6 hours: °You cannot eat or drink. °You feel like you may vomit. °You vomit. °You have watery poop (diarrhea). °Get help right away if: °Your blood sugar is lower than 54 mg/dL (3 mmol/L). °You feel mixed up (confused). °You have trouble thinking clearly. °You have trouble breathing. °You have medium or large ketone levels in your pee. °These symptoms may be an emergency. Get help right away. Call your local emergency services (911 in the [place] °Do not wait to see if the symptoms will go away. °Do not drive yourself   to the hospital. °Summary °Type 2 diabetes is a long-term disease. Your pancreas may not make enough insulin, or your body may not react in a normal way to insulin that it makes. °This condition is treated with an eating plan, lifestyle changes, and medicines. °Your doctor will set treatment goals for you. These will help you keep your blood sugar  in a healthy range. °Keep all follow-up visits. °This information is not intended to replace advice given to you by your health care provider. Make sure you discuss any questions you have with your health care provider. °Document Revised: 08/04/2020 Document Reviewed: 08/04/2020 °Elsevier Patient Education © 2022 Elsevier Inc. ° °

## 2021-07-07 NOTE — Progress Notes (Signed)
I,Victoria T Hamilton,acting as a Education administrator for [name], FNP.,have documented all relevant documentation on the behalf of [name], FNP,as directed by  [name], FNP while in the presence of [name], [place].   This visit occurred during the SARS-CoV-2 public health emergency.  Safety protocols were in place, including screening questions prior to the visit, additional usage of staff PPE, and extensive cleaning of exam room while observing appropriate contact time as indicated for disinfecting solutions.  Subjective:     Patient ID: [name] , male    DOB: March 24, 1961 , 61 y.o.   MRN: [id number]   Chief Complaint  Patient presents with   Diabetes    HPI  Pt presents today for DM. Continues dialysis three days a week MWF. Dialysis takes his appetite away.   He would like to know his blood type. Had blood transfusion in 2019 was A positive He had 2 injections to his shoulder 1 week ago, has helped a little.     Past Medical History:  Diagnosis Date   Acute lower GI bleeding 06/21/2018   Acute respiratory failure with hypoxia (HCC) 01/13/2015   Atrial flutter (HCC)    Bile leak, postoperative 03/15/2016   Biliary dyskinesia 02/12/2016   Blind right eye    Hx: of partial blindness in right eye   Cardiomyopathy    CHF (congestive heart failure) ([place])    Coronary artery disease    normal coronaries by 10/10/08 cath, Cardiac Cath 08-04-12 epic.Dr. Doylene Canard follows   Diabetes mellitus    pt. states he's borderline diabetic., no longer taking med,- off med. since 2013   Dialysis patient [facility])    Mon-Wed-Fri([place])- Left AV fistula   Diverticulitis November 2016   reoccurred in December 2016   Diverticulitis of intestine without perforation or abscess without bleeding 04/21/2015   DVT (deep venous thrombosis) (HCC)    ESRD (end stage renal disease) ([place])    GERD (gastroesophageal reflux disease)    Gout    Hemorrhage of left kidney 01/13/2018   History of  nephrectomy 07/04/2012   History of right nephrectomy in 2002 for renal cell carcinoma    History of unilateral nephrectomy    Hypertension    Low iron    Myocardial infarction [facility]) ?2006   Renal cell carcinoma    dialysis- MWF- [address]- Dr. Mercy Kammie Scioli follows.   Renal insufficiency    Shortness of breath 05/19/11   "at rest, lying down, w/exertion"   Stroke [facility]) 02/2011   05/19/11 denies residual   Umbilical hernia 19/14/78   unrepaired   Wears glasses      Family History  Problem Relation Age of Onset   Hypertension Mother    Kidney disease Mother      Current Outpatient Medications:    albuterol (VENTOLIN HFA) 108 (90 Base) MCG/ACT inhaler, Inhale 2 puffs into the lungs every 6 (six) hours as needed for wheezing or shortness of breath., Disp: 18 g, Rfl: 2   amiodarone (PACERONE) 200 MG tablet, Take 1 tablet (200 mg total) by mouth 2 (two) times daily., Disp: 60 tablet, Rfl: 1   calcium acetate (PHOSLO) 667 MG capsule, Take 1-3 capsules (667-2,001 mg total) by mouth See admin instructions. Take 2001 mg capsules with meals three times daily and 667 mg capsule with snacks. (Patient taking differently: Take 2,001 mg by mouth daily.), Disp:  , Rfl:    chlorhexidine (PERIDEX) 0.12 % solution, 15 mLs by Mouth Rinse route 2 (two)  times daily., Disp: , Rfl:    cinacalcet (SENSIPAR) 90 MG tablet, Take 180 mg by mouth daily., Disp: , Rfl:    Control Gel Formula Dressing (DUODERM CGF EXTRA THIN) MISC, Apply 1 each topically daily as needed., Disp: 10 each, Rfl: 2   diclofenac Sodium (VOLTAREN) 1 % GEL, Apply 2 g topically 4 (four) times daily., Disp: 100 g, Rfl: 2   digoxin (LANOXIN) 0.125 MG tablet, Take 1 tablet (0.125 mg total) by mouth every Monday, Wednesday, and Friday., Disp: 30 tablet, Rfl: 1   ferric citrate (AURYXIA) 1 GM 210 MG(Fe) tablet, Take 630 mg by mouth 3 (three) times daily with meals., Disp: , Rfl:    fluticasone furoate-vilanterol (BREO ELLIPTA) 100-25 MCG/ACT  AEPB, Inhale 1 puff into the lungs daily., Disp: 60 each, Rfl: 6   gabapentin (NEURONTIN) 100 MG capsule, Take 1 capsule (100 mg total) by mouth 3 (three) times daily as needed (coughing uncontrollably)., Disp: 90 capsule, Rfl: 1   HYDROcodone bit-homatropine (HYDROMET) 5-1.5 MG/5ML syrup, Take 5 mLs by mouth every 6 (six) hours as needed for cough., Disp: 120 mL, Rfl: 0   ipratropium (ATROVENT) 0.06 % nasal spray, Place 2 sprays into both nostrils 4 (four) times daily. (Patient taking differently: Place 2 sprays into both nostrils 4 (four) times daily as needed for rhinitis.), Disp: 15 mL, Rfl: 12   isosorbide dinitrate (ISORDIL) 10 MG tablet, Take 10 mg by mouth 2 (two) times daily., Disp: , Rfl:    Lactobacillus (ACIDOPHILUS/BIFIDUS PO), Take 1 capsule by mouth daily. Written by Dr. Collene Mares to take once per day., Disp: , Rfl:    lidocaine-prilocaine (EMLA) cream, Apply 1 application topically See admin instructions. Apply topically to port access prior to dialysis - Monday, Wednesday, Friday., Disp: , Rfl: 12   loperamide (IMODIUM A-D) 2 MG tablet, Take 1 tablet (2 mg total) by mouth 4 (four) times daily as needed for diarrhea or loose stools. Also available OTC., Disp: 30 tablet, Rfl: 0   meclizine (ANTIVERT) 25 MG tablet, Take 1 tablet (25 mg total) by mouth 3 (three) times daily as needed for dizziness., Disp: 30 tablet, Rfl: 0   metoprolol succinate (TOPROL-XL) 25 MG 24 hr tablet, Take 1 tablet (25 mg total) by mouth daily., Disp: 30 tablet, Rfl: 3   metoprolol tartrate (LOPRESSOR) 25 MG tablet, Take 25 mg by mouth 2 (two) times daily., Disp: , Rfl:    midodrine (PROAMATINE) 10 MG tablet, Take 1 tablet (10 mg total) by mouth 3 (three) times daily., Disp: 90 tablet, Rfl: 3   multivitamin (RENA-VIT) TABS tablet, Take 1 tablet by mouth at bedtime., Disp: , Rfl:    nitroGLYCERIN (NITROSTAT) 0.4 MG SL tablet, Place 0.4 mg under the tongue every 5 (five) minutes as needed for chest pain. , Disp: , Rfl:  0   Nutritional Supplements (FEEDING SUPPLEMENT, NEPRO CARB STEADY,) LIQD, Take 237 mLs by mouth daily., Disp: , Rfl:    ondansetron (ZOFRAN ODT) 4 MG disintegrating tablet, Take 1 tablet (4 mg total) by mouth every 8 (eight) hours as needed for nausea or vomiting., Disp: 20 tablet, Rfl: 0   oxyCODONE-acetaminophen (PERCOCET/ROXICET) 5-325 MG tablet, Take 1 tablet by mouth every 4 (four) hours as needed for severe pain., Disp: , Rfl:    pantoprazole (PROTONIX) 40 MG tablet, Take 40 mg by mouth 2 (two) times daily., Disp: , Rfl:    sucroferric oxyhydroxide (VELPHORO) 500 MG chewable tablet, Chew 500-1,000 mg by mouth See admin instructions. Takes 2  tablets with each meal and 1 tablet with snacks., Disp: , Rfl:    traMADol (ULTRAM) 50 MG tablet, Take by mouth., Disp: , Rfl:    UNABLE TO FIND, Out patient physical therapy- vestibular therapy for BPPV, Disp: 1 each, Rfl: 0   warfarin (COUMADIN) 7.5 MG tablet, Take 7.5 mg by mouth daily., Disp: , Rfl:    Allergies  Allergen Reactions   Ace Inhibitors Itching, Cough and Other (See Comments)     Review of Systems  Constitutional: Negative.   HENT: Negative.    Cardiovascular: Negative.  Negative for chest pain, palpitations and leg swelling.  Endocrine: Negative.   Musculoskeletal:  Positive for arthralgias (bilateral shoulder pain due to bone on bone - he is taking pain medicine he is not a candidate for surgery).  Skin: Negative.   Allergic/Immunologic: Negative.   Neurological: Negative.   Psychiatric/Behavioral: Negative.      Today's Vitals   07/07/21 1550  BP: 102/72  Pulse: 85  Temp: 98.2 F (36.8 C)  SpO2: 97%  Weight: 243 lb (110.2 kg)  Height: 6\' 3"  (1.905 m)   Body mass index is 30.37 kg/m.  Wt Readings from Last 3 Encounters:  07/07/21 243 lb (110.2 kg)  05/05/21 243 lb (110.2 kg)  12/25/20 237 lb 6.4 oz (107.7 kg)    Objective:  Physical Exam Vitals reviewed.  Constitutional:      Appearance: Normal appearance. He  is obese.  Cardiovascular:     Rate and Rhythm: Normal rate and regular rhythm.     Pulses: Normal pulses.     Heart sounds: Normal heart sounds. No murmur heard. Pulmonary:     Effort: No respiratory distress.     Breath sounds: Normal breath sounds. No wheezing.  Musculoskeletal:        General: No swelling or tenderness.  Skin:    General: Skin is warm and dry.     Capillary Refill: Capillary refill takes less than 2 seconds.  Neurological:     General: No focal deficit present.     Mental Status: He is alert and oriented to person, place, and time.     Cranial Nerves: No cranial nerve deficit.     Motor: No weakness.  Psychiatric:        Mood and Affect: Mood normal.        Behavior: Behavior normal.        Thought Content: Thought content normal.        Judgment: Judgment normal.        Assessment And Plan:     1. Type 2 diabetes mellitus with end-stage renal disease ([place]) Comments: HgbA1c has been stable, no current medications - Hemoglobin A1c  2. Hypertension, benign Comments: Well controlled, continue current medications  3. Chronic pain of both shoulders Comments: Continue follow up with Orthopedics  4. Mixed hyperlipidemia Comments: No current medications, encouraged to eat a low fat diet - Lipid panel  5. Dependence on renal dialysis [facility]) Comments: Doing well with dialysis  6. Hypertensive heart and CKD, ESRD on dialysis, w CHF ([place])  7. Atherosclerosis of native coronary artery of native heart with angina pectoris ([place]) Comments: Followed by Cardiology  8. Long-term current use of high risk medication other than anticoagulant Comments: Continue follow up with Cardiology for INR checks  9. Class 1 obesity due to excess calories without serious comorbidity with body mass index (BMI) of 30.0 to 30.9 in adult He is encouraged to strive for BMI less than  30 to decrease cardiac risk. Advised to aim for at least 150 minutes of exercise per week.     Patient was given opportunity to ask questions. Patient verbalized understanding of the plan and was able to repeat key elements of the plan. All questions were answered to their satisfaction.  [name], FNP    I, [name], FNP, have reviewed all documentation for this visit. The documentation on 07/07/21 for the exam, diagnosis, procedures, and orders are all accurate and complete.   IF YOU HAVE BEEN REFERRED TO A SPECIALIST, IT MAY TAKE 1-2 WEEKS TO SCHEDULE/PROCESS THE REFERRAL. IF YOU HAVE NOT HEARD FROM US/SPECIALIST IN TWO WEEKS, PLEASE GIVE [place] A CALL AT [phone].   THE PATIENT IS ENCOURAGED TO PRACTICE SOCIAL DISTANCING DUE TO THE COVID-19 PANDEMIC.

## 2021-07-08 ENCOUNTER — Telehealth: Payer: Self-pay

## 2021-07-08 DIAGNOSIS — Z992 Dependence on renal dialysis: Secondary | ICD-10-CM | POA: Diagnosis not present

## 2021-07-08 DIAGNOSIS — D631 Anemia in chronic kidney disease: Secondary | ICD-10-CM | POA: Diagnosis not present

## 2021-07-08 DIAGNOSIS — D509 Iron deficiency anemia, unspecified: Secondary | ICD-10-CM | POA: Diagnosis not present

## 2021-07-08 DIAGNOSIS — N186 End stage renal disease: Secondary | ICD-10-CM | POA: Diagnosis not present

## 2021-07-08 DIAGNOSIS — N2581 Secondary hyperparathyroidism of renal origin: Secondary | ICD-10-CM | POA: Diagnosis not present

## 2021-07-08 LAB — HEMOGLOBIN A1C
Est. average glucose Bld gHb Est-mCnc: 105 mg/dL
Hgb A1c MFr Bld: 5.3 % (ref 4.8–5.6)

## 2021-07-08 LAB — LIPID PANEL
Chol/HDL Ratio: 3.4 ratio (ref 0.0–5.0)
Cholesterol, Total: 182 mg/dL (ref 100–199)
HDL: 53 mg/dL (ref >39)
LDL Chol Calc (NIH): 90 mg/dL (ref 0–99)
Triglycerides: 231 mg/dL — ABNORMAL HIGH (ref 0–149)
VLDL Cholesterol Cal: 39 mg/dL (ref 5–40)

## 2021-07-08 NOTE — Telephone Encounter (Signed)
I returned the pt;s call because he had a question. I was unable to leave a message when I called the pt back.

## 2021-07-10 DIAGNOSIS — D509 Iron deficiency anemia, unspecified: Secondary | ICD-10-CM | POA: Diagnosis not present

## 2021-07-10 DIAGNOSIS — N186 End stage renal disease: Secondary | ICD-10-CM | POA: Diagnosis not present

## 2021-07-10 DIAGNOSIS — D631 Anemia in chronic kidney disease: Secondary | ICD-10-CM | POA: Diagnosis not present

## 2021-07-10 DIAGNOSIS — Z992 Dependence on renal dialysis: Secondary | ICD-10-CM | POA: Diagnosis not present

## 2021-07-10 DIAGNOSIS — N2581 Secondary hyperparathyroidism of renal origin: Secondary | ICD-10-CM | POA: Diagnosis not present

## 2021-07-13 DIAGNOSIS — Z992 Dependence on renal dialysis: Secondary | ICD-10-CM | POA: Diagnosis not present

## 2021-07-13 DIAGNOSIS — N2581 Secondary hyperparathyroidism of renal origin: Secondary | ICD-10-CM | POA: Diagnosis not present

## 2021-07-13 DIAGNOSIS — D689 Coagulation defect, unspecified: Secondary | ICD-10-CM | POA: Diagnosis not present

## 2021-07-13 DIAGNOSIS — D509 Iron deficiency anemia, unspecified: Secondary | ICD-10-CM | POA: Diagnosis not present

## 2021-07-13 DIAGNOSIS — N186 End stage renal disease: Secondary | ICD-10-CM | POA: Diagnosis not present

## 2021-07-13 DIAGNOSIS — D631 Anemia in chronic kidney disease: Secondary | ICD-10-CM | POA: Diagnosis not present

## 2021-07-15 DIAGNOSIS — N186 End stage renal disease: Secondary | ICD-10-CM | POA: Diagnosis not present

## 2021-07-15 DIAGNOSIS — Z992 Dependence on renal dialysis: Secondary | ICD-10-CM | POA: Diagnosis not present

## 2021-07-15 DIAGNOSIS — D631 Anemia in chronic kidney disease: Secondary | ICD-10-CM | POA: Diagnosis not present

## 2021-07-15 DIAGNOSIS — N2581 Secondary hyperparathyroidism of renal origin: Secondary | ICD-10-CM | POA: Diagnosis not present

## 2021-07-15 DIAGNOSIS — D509 Iron deficiency anemia, unspecified: Secondary | ICD-10-CM | POA: Diagnosis not present

## 2021-07-16 NOTE — Telephone Encounter (Signed)
ATC pt. Message is over a month old. Pt is due for follow up in June. Pt informed to call if needed. Will close encounter.

## 2021-07-17 DIAGNOSIS — N2581 Secondary hyperparathyroidism of renal origin: Secondary | ICD-10-CM | POA: Diagnosis not present

## 2021-07-17 DIAGNOSIS — N186 End stage renal disease: Secondary | ICD-10-CM | POA: Diagnosis not present

## 2021-07-17 DIAGNOSIS — D631 Anemia in chronic kidney disease: Secondary | ICD-10-CM | POA: Diagnosis not present

## 2021-07-17 DIAGNOSIS — D509 Iron deficiency anemia, unspecified: Secondary | ICD-10-CM | POA: Diagnosis not present

## 2021-07-17 DIAGNOSIS — Z992 Dependence on renal dialysis: Secondary | ICD-10-CM | POA: Diagnosis not present

## 2021-07-20 DIAGNOSIS — D631 Anemia in chronic kidney disease: Secondary | ICD-10-CM | POA: Diagnosis not present

## 2021-07-20 DIAGNOSIS — D689 Coagulation defect, unspecified: Secondary | ICD-10-CM | POA: Diagnosis not present

## 2021-07-20 DIAGNOSIS — N186 End stage renal disease: Secondary | ICD-10-CM | POA: Diagnosis not present

## 2021-07-20 DIAGNOSIS — D509 Iron deficiency anemia, unspecified: Secondary | ICD-10-CM | POA: Diagnosis not present

## 2021-07-20 DIAGNOSIS — Z992 Dependence on renal dialysis: Secondary | ICD-10-CM | POA: Diagnosis not present

## 2021-07-20 DIAGNOSIS — N2581 Secondary hyperparathyroidism of renal origin: Secondary | ICD-10-CM | POA: Diagnosis not present

## 2021-07-21 ENCOUNTER — Ambulatory Visit: Payer: Medicare Other | Admitting: Podiatry

## 2021-07-21 ENCOUNTER — Ambulatory Visit (INDEPENDENT_AMBULATORY_CARE_PROVIDER_SITE_OTHER): Payer: Medicare Other | Admitting: Podiatrist

## 2021-07-21 ENCOUNTER — Other Ambulatory Visit: Payer: Self-pay

## 2021-07-21 ENCOUNTER — Encounter: Payer: Self-pay | Admitting: Podiatrist

## 2021-07-21 DIAGNOSIS — M79674 Pain in right toe(s): Secondary | ICD-10-CM

## 2021-07-21 DIAGNOSIS — Z7901 Long term (current) use of anticoagulants: Secondary | ICD-10-CM | POA: Diagnosis not present

## 2021-07-21 DIAGNOSIS — B351 Tinea unguium: Secondary | ICD-10-CM

## 2021-07-21 DIAGNOSIS — M79675 Pain in left toe(s): Secondary | ICD-10-CM | POA: Diagnosis not present

## 2021-07-21 NOTE — Progress Notes (Signed)
This patient returns to my office for at risk foot care.  This patient requires this care by a professional since this patient will be at risk due to having DM, chronic anticoagulation via Coumadin 7.5mg  and ESRD.  This patient is unable to cut nails himself since the patient cannot reach his nails.These nails are painful walking and wearing shoes.  This patient presents for at risk foot care today.   General Appearance  Alert, conversant and in no acute stress.   Vascular  Dorsalis pedis and posterior tibial  pulses are palpable  bilaterally.  Capillary return is within normal limits  bilaterally. Temperature is within normal limits  bilaterally.   Neurologic  Senn-Weinstein monofilament wire test within normal limits  bilaterally. Muscle power within normal limits bilaterally.   Nails Thick disfigured discolored nails with subungual debris  from hallux to fifth toes bilaterally. No evidence of bacterial infection or drainage bilaterally.   Orthopedic  No limitations of motion  feet .  No crepitus or effusions noted.  No bony pathology or digital deformities noted.   Skin  normotropic skin with no porokeratosis noted bilaterally.  No signs of infections or ulcers noted.      Onychomycosis  Pain in right toes  Pain in left toes   Consent was obtained for treatment procedures.   Mechanical debridement of nails 1-5  bilaterally performed with a nail nipper.  Filed with dremel without incident.      Return office visit     3 months                 Told patient to return for periodic foot care and evaluation due to potential at risk complications.

## 2021-07-22 DIAGNOSIS — D509 Iron deficiency anemia, unspecified: Secondary | ICD-10-CM | POA: Diagnosis not present

## 2021-07-22 DIAGNOSIS — N2581 Secondary hyperparathyroidism of renal origin: Secondary | ICD-10-CM | POA: Diagnosis not present

## 2021-07-22 DIAGNOSIS — N186 End stage renal disease: Secondary | ICD-10-CM | POA: Diagnosis not present

## 2021-07-22 DIAGNOSIS — D631 Anemia in chronic kidney disease: Secondary | ICD-10-CM | POA: Diagnosis not present

## 2021-07-22 DIAGNOSIS — Z992 Dependence on renal dialysis: Secondary | ICD-10-CM | POA: Diagnosis not present

## 2021-07-22 DIAGNOSIS — I120 Hypertensive chronic kidney disease with stage 5 chronic kidney disease or end stage renal disease: Secondary | ICD-10-CM | POA: Diagnosis not present

## 2021-07-24 DIAGNOSIS — D509 Iron deficiency anemia, unspecified: Secondary | ICD-10-CM | POA: Diagnosis not present

## 2021-07-24 DIAGNOSIS — Z992 Dependence on renal dialysis: Secondary | ICD-10-CM | POA: Diagnosis not present

## 2021-07-24 DIAGNOSIS — D631 Anemia in chronic kidney disease: Secondary | ICD-10-CM | POA: Diagnosis not present

## 2021-07-24 DIAGNOSIS — N186 End stage renal disease: Secondary | ICD-10-CM | POA: Diagnosis not present

## 2021-07-24 DIAGNOSIS — N2581 Secondary hyperparathyroidism of renal origin: Secondary | ICD-10-CM | POA: Diagnosis not present

## 2021-07-27 DIAGNOSIS — N186 End stage renal disease: Secondary | ICD-10-CM | POA: Diagnosis not present

## 2021-07-27 DIAGNOSIS — Z992 Dependence on renal dialysis: Secondary | ICD-10-CM | POA: Diagnosis not present

## 2021-07-27 DIAGNOSIS — D689 Coagulation defect, unspecified: Secondary | ICD-10-CM | POA: Diagnosis not present

## 2021-07-27 DIAGNOSIS — N2581 Secondary hyperparathyroidism of renal origin: Secondary | ICD-10-CM | POA: Diagnosis not present

## 2021-07-27 DIAGNOSIS — D509 Iron deficiency anemia, unspecified: Secondary | ICD-10-CM | POA: Diagnosis not present

## 2021-07-27 DIAGNOSIS — D631 Anemia in chronic kidney disease: Secondary | ICD-10-CM | POA: Diagnosis not present

## 2021-07-28 ENCOUNTER — Telehealth: Payer: Self-pay | Admitting: Pulmonary Disease

## 2021-07-28 NOTE — Telephone Encounter (Signed)
Hey I noticed that this patient wanted his Hydromet refilled but you never file dit in the first place. I believe it was another doctor that he sees. Do you want me to call him and tell him you aren't refilling this medication or do you want to fill this medication ? ?Please advise Dr Silas Flood  ?

## 2021-07-28 NOTE — Telephone Encounter (Signed)
Would advise he contact PCP who prescribed this in past. Is he using his Breo every day? Thanks!

## 2021-07-29 DIAGNOSIS — D509 Iron deficiency anemia, unspecified: Secondary | ICD-10-CM | POA: Diagnosis not present

## 2021-07-29 DIAGNOSIS — N186 End stage renal disease: Secondary | ICD-10-CM | POA: Diagnosis not present

## 2021-07-29 DIAGNOSIS — D631 Anemia in chronic kidney disease: Secondary | ICD-10-CM | POA: Diagnosis not present

## 2021-07-29 DIAGNOSIS — N2581 Secondary hyperparathyroidism of renal origin: Secondary | ICD-10-CM | POA: Diagnosis not present

## 2021-07-29 DIAGNOSIS — Z992 Dependence on renal dialysis: Secondary | ICD-10-CM | POA: Diagnosis not present

## 2021-07-29 NOTE — Telephone Encounter (Signed)
Attempted to call patient but no voicemail picked up. Will try to call patient again ? ?

## 2021-07-31 DIAGNOSIS — Z992 Dependence on renal dialysis: Secondary | ICD-10-CM | POA: Diagnosis not present

## 2021-07-31 DIAGNOSIS — D509 Iron deficiency anemia, unspecified: Secondary | ICD-10-CM | POA: Diagnosis not present

## 2021-07-31 DIAGNOSIS — N186 End stage renal disease: Secondary | ICD-10-CM | POA: Diagnosis not present

## 2021-07-31 DIAGNOSIS — N2581 Secondary hyperparathyroidism of renal origin: Secondary | ICD-10-CM | POA: Diagnosis not present

## 2021-07-31 DIAGNOSIS — D631 Anemia in chronic kidney disease: Secondary | ICD-10-CM | POA: Diagnosis not present

## 2021-08-03 DIAGNOSIS — D631 Anemia in chronic kidney disease: Secondary | ICD-10-CM | POA: Diagnosis not present

## 2021-08-03 DIAGNOSIS — N2581 Secondary hyperparathyroidism of renal origin: Secondary | ICD-10-CM | POA: Diagnosis not present

## 2021-08-03 DIAGNOSIS — D689 Coagulation defect, unspecified: Secondary | ICD-10-CM | POA: Diagnosis not present

## 2021-08-03 DIAGNOSIS — Z992 Dependence on renal dialysis: Secondary | ICD-10-CM | POA: Diagnosis not present

## 2021-08-03 DIAGNOSIS — D509 Iron deficiency anemia, unspecified: Secondary | ICD-10-CM | POA: Diagnosis not present

## 2021-08-03 DIAGNOSIS — N186 End stage renal disease: Secondary | ICD-10-CM | POA: Diagnosis not present

## 2021-08-05 DIAGNOSIS — N2581 Secondary hyperparathyroidism of renal origin: Secondary | ICD-10-CM | POA: Diagnosis not present

## 2021-08-05 DIAGNOSIS — N186 End stage renal disease: Secondary | ICD-10-CM | POA: Diagnosis not present

## 2021-08-05 DIAGNOSIS — D631 Anemia in chronic kidney disease: Secondary | ICD-10-CM | POA: Diagnosis not present

## 2021-08-05 DIAGNOSIS — D509 Iron deficiency anemia, unspecified: Secondary | ICD-10-CM | POA: Diagnosis not present

## 2021-08-05 DIAGNOSIS — Z992 Dependence on renal dialysis: Secondary | ICD-10-CM | POA: Diagnosis not present

## 2021-08-07 DIAGNOSIS — D631 Anemia in chronic kidney disease: Secondary | ICD-10-CM | POA: Diagnosis not present

## 2021-08-07 DIAGNOSIS — D509 Iron deficiency anemia, unspecified: Secondary | ICD-10-CM | POA: Diagnosis not present

## 2021-08-07 DIAGNOSIS — Z992 Dependence on renal dialysis: Secondary | ICD-10-CM | POA: Diagnosis not present

## 2021-08-07 DIAGNOSIS — N186 End stage renal disease: Secondary | ICD-10-CM | POA: Diagnosis not present

## 2021-08-07 DIAGNOSIS — N2581 Secondary hyperparathyroidism of renal origin: Secondary | ICD-10-CM | POA: Diagnosis not present

## 2021-08-10 DIAGNOSIS — D509 Iron deficiency anemia, unspecified: Secondary | ICD-10-CM | POA: Diagnosis not present

## 2021-08-10 DIAGNOSIS — N2581 Secondary hyperparathyroidism of renal origin: Secondary | ICD-10-CM | POA: Diagnosis not present

## 2021-08-10 DIAGNOSIS — Z992 Dependence on renal dialysis: Secondary | ICD-10-CM | POA: Diagnosis not present

## 2021-08-10 DIAGNOSIS — D631 Anemia in chronic kidney disease: Secondary | ICD-10-CM | POA: Diagnosis not present

## 2021-08-10 DIAGNOSIS — N186 End stage renal disease: Secondary | ICD-10-CM | POA: Diagnosis not present

## 2021-08-12 DIAGNOSIS — Z992 Dependence on renal dialysis: Secondary | ICD-10-CM | POA: Diagnosis not present

## 2021-08-12 DIAGNOSIS — D509 Iron deficiency anemia, unspecified: Secondary | ICD-10-CM | POA: Diagnosis not present

## 2021-08-12 DIAGNOSIS — N186 End stage renal disease: Secondary | ICD-10-CM | POA: Diagnosis not present

## 2021-08-12 DIAGNOSIS — N2581 Secondary hyperparathyroidism of renal origin: Secondary | ICD-10-CM | POA: Diagnosis not present

## 2021-08-12 DIAGNOSIS — D631 Anemia in chronic kidney disease: Secondary | ICD-10-CM | POA: Diagnosis not present

## 2021-08-14 DIAGNOSIS — D509 Iron deficiency anemia, unspecified: Secondary | ICD-10-CM | POA: Diagnosis not present

## 2021-08-14 DIAGNOSIS — N2581 Secondary hyperparathyroidism of renal origin: Secondary | ICD-10-CM | POA: Diagnosis not present

## 2021-08-14 DIAGNOSIS — D631 Anemia in chronic kidney disease: Secondary | ICD-10-CM | POA: Diagnosis not present

## 2021-08-14 DIAGNOSIS — Z992 Dependence on renal dialysis: Secondary | ICD-10-CM | POA: Diagnosis not present

## 2021-08-14 DIAGNOSIS — N186 End stage renal disease: Secondary | ICD-10-CM | POA: Diagnosis not present

## 2021-08-17 DIAGNOSIS — N186 End stage renal disease: Secondary | ICD-10-CM | POA: Diagnosis not present

## 2021-08-17 DIAGNOSIS — D509 Iron deficiency anemia, unspecified: Secondary | ICD-10-CM | POA: Diagnosis not present

## 2021-08-17 DIAGNOSIS — D689 Coagulation defect, unspecified: Secondary | ICD-10-CM | POA: Diagnosis not present

## 2021-08-17 DIAGNOSIS — N2581 Secondary hyperparathyroidism of renal origin: Secondary | ICD-10-CM | POA: Diagnosis not present

## 2021-08-17 DIAGNOSIS — Z992 Dependence on renal dialysis: Secondary | ICD-10-CM | POA: Diagnosis not present

## 2021-08-17 DIAGNOSIS — D631 Anemia in chronic kidney disease: Secondary | ICD-10-CM | POA: Diagnosis not present

## 2021-08-19 DIAGNOSIS — D631 Anemia in chronic kidney disease: Secondary | ICD-10-CM | POA: Diagnosis not present

## 2021-08-19 DIAGNOSIS — D509 Iron deficiency anemia, unspecified: Secondary | ICD-10-CM | POA: Diagnosis not present

## 2021-08-19 DIAGNOSIS — N2581 Secondary hyperparathyroidism of renal origin: Secondary | ICD-10-CM | POA: Diagnosis not present

## 2021-08-19 DIAGNOSIS — Z992 Dependence on renal dialysis: Secondary | ICD-10-CM | POA: Diagnosis not present

## 2021-08-19 DIAGNOSIS — N186 End stage renal disease: Secondary | ICD-10-CM | POA: Diagnosis not present

## 2021-08-21 DIAGNOSIS — N2581 Secondary hyperparathyroidism of renal origin: Secondary | ICD-10-CM | POA: Diagnosis not present

## 2021-08-21 DIAGNOSIS — Z992 Dependence on renal dialysis: Secondary | ICD-10-CM | POA: Diagnosis not present

## 2021-08-21 DIAGNOSIS — D509 Iron deficiency anemia, unspecified: Secondary | ICD-10-CM | POA: Diagnosis not present

## 2021-08-21 DIAGNOSIS — N186 End stage renal disease: Secondary | ICD-10-CM | POA: Diagnosis not present

## 2021-08-21 DIAGNOSIS — D631 Anemia in chronic kidney disease: Secondary | ICD-10-CM | POA: Diagnosis not present

## 2021-08-22 DIAGNOSIS — Z992 Dependence on renal dialysis: Secondary | ICD-10-CM | POA: Diagnosis not present

## 2021-08-22 DIAGNOSIS — I120 Hypertensive chronic kidney disease with stage 5 chronic kidney disease or end stage renal disease: Secondary | ICD-10-CM | POA: Diagnosis not present

## 2021-08-22 DIAGNOSIS — N186 End stage renal disease: Secondary | ICD-10-CM | POA: Diagnosis not present

## 2021-08-24 DIAGNOSIS — N186 End stage renal disease: Secondary | ICD-10-CM | POA: Diagnosis not present

## 2021-08-24 DIAGNOSIS — D631 Anemia in chronic kidney disease: Secondary | ICD-10-CM | POA: Diagnosis not present

## 2021-08-24 DIAGNOSIS — Z992 Dependence on renal dialysis: Secondary | ICD-10-CM | POA: Diagnosis not present

## 2021-08-24 DIAGNOSIS — D689 Coagulation defect, unspecified: Secondary | ICD-10-CM | POA: Diagnosis not present

## 2021-08-24 DIAGNOSIS — D509 Iron deficiency anemia, unspecified: Secondary | ICD-10-CM | POA: Diagnosis not present

## 2021-08-24 DIAGNOSIS — N2581 Secondary hyperparathyroidism of renal origin: Secondary | ICD-10-CM | POA: Diagnosis not present

## 2021-08-25 DIAGNOSIS — M25512 Pain in left shoulder: Secondary | ICD-10-CM | POA: Diagnosis not present

## 2021-08-26 DIAGNOSIS — N186 End stage renal disease: Secondary | ICD-10-CM | POA: Diagnosis not present

## 2021-08-26 DIAGNOSIS — D631 Anemia in chronic kidney disease: Secondary | ICD-10-CM | POA: Diagnosis not present

## 2021-08-26 DIAGNOSIS — N2581 Secondary hyperparathyroidism of renal origin: Secondary | ICD-10-CM | POA: Diagnosis not present

## 2021-08-26 DIAGNOSIS — D509 Iron deficiency anemia, unspecified: Secondary | ICD-10-CM | POA: Diagnosis not present

## 2021-08-26 DIAGNOSIS — Z992 Dependence on renal dialysis: Secondary | ICD-10-CM | POA: Diagnosis not present

## 2021-08-28 DIAGNOSIS — N2581 Secondary hyperparathyroidism of renal origin: Secondary | ICD-10-CM | POA: Diagnosis not present

## 2021-08-28 DIAGNOSIS — N186 End stage renal disease: Secondary | ICD-10-CM | POA: Diagnosis not present

## 2021-08-28 DIAGNOSIS — D509 Iron deficiency anemia, unspecified: Secondary | ICD-10-CM | POA: Diagnosis not present

## 2021-08-28 DIAGNOSIS — D631 Anemia in chronic kidney disease: Secondary | ICD-10-CM | POA: Diagnosis not present

## 2021-08-28 DIAGNOSIS — Z992 Dependence on renal dialysis: Secondary | ICD-10-CM | POA: Diagnosis not present

## 2021-08-31 DIAGNOSIS — E1129 Type 2 diabetes mellitus with other diabetic kidney complication: Secondary | ICD-10-CM | POA: Diagnosis not present

## 2021-08-31 DIAGNOSIS — N2581 Secondary hyperparathyroidism of renal origin: Secondary | ICD-10-CM | POA: Diagnosis not present

## 2021-08-31 DIAGNOSIS — N186 End stage renal disease: Secondary | ICD-10-CM | POA: Diagnosis not present

## 2021-08-31 DIAGNOSIS — D509 Iron deficiency anemia, unspecified: Secondary | ICD-10-CM | POA: Diagnosis not present

## 2021-08-31 DIAGNOSIS — D689 Coagulation defect, unspecified: Secondary | ICD-10-CM | POA: Diagnosis not present

## 2021-08-31 DIAGNOSIS — D631 Anemia in chronic kidney disease: Secondary | ICD-10-CM | POA: Diagnosis not present

## 2021-08-31 DIAGNOSIS — Z992 Dependence on renal dialysis: Secondary | ICD-10-CM | POA: Diagnosis not present

## 2021-09-01 NOTE — Telephone Encounter (Signed)
Attempted to call pt but when seemed like line was going to connect, the line got disconnected. Tried to call back but line went to a busy tone. Unable to reach. Due to multiple attempts trying to reach pt and unable to do so, per protocol encounter will be closed. ?

## 2021-09-02 DIAGNOSIS — N186 End stage renal disease: Secondary | ICD-10-CM | POA: Diagnosis not present

## 2021-09-02 DIAGNOSIS — D509 Iron deficiency anemia, unspecified: Secondary | ICD-10-CM | POA: Diagnosis not present

## 2021-09-02 DIAGNOSIS — D631 Anemia in chronic kidney disease: Secondary | ICD-10-CM | POA: Diagnosis not present

## 2021-09-02 DIAGNOSIS — Z992 Dependence on renal dialysis: Secondary | ICD-10-CM | POA: Diagnosis not present

## 2021-09-02 DIAGNOSIS — N2581 Secondary hyperparathyroidism of renal origin: Secondary | ICD-10-CM | POA: Diagnosis not present

## 2021-09-04 DIAGNOSIS — D509 Iron deficiency anemia, unspecified: Secondary | ICD-10-CM | POA: Diagnosis not present

## 2021-09-04 DIAGNOSIS — N2581 Secondary hyperparathyroidism of renal origin: Secondary | ICD-10-CM | POA: Diagnosis not present

## 2021-09-04 DIAGNOSIS — D631 Anemia in chronic kidney disease: Secondary | ICD-10-CM | POA: Diagnosis not present

## 2021-09-04 DIAGNOSIS — N186 End stage renal disease: Secondary | ICD-10-CM | POA: Diagnosis not present

## 2021-09-04 DIAGNOSIS — Z992 Dependence on renal dialysis: Secondary | ICD-10-CM | POA: Diagnosis not present

## 2021-09-07 DIAGNOSIS — Z20822 Contact with and (suspected) exposure to covid-19: Secondary | ICD-10-CM | POA: Diagnosis not present

## 2021-09-07 DIAGNOSIS — N2581 Secondary hyperparathyroidism of renal origin: Secondary | ICD-10-CM | POA: Diagnosis not present

## 2021-09-07 DIAGNOSIS — Z992 Dependence on renal dialysis: Secondary | ICD-10-CM | POA: Diagnosis not present

## 2021-09-07 DIAGNOSIS — D509 Iron deficiency anemia, unspecified: Secondary | ICD-10-CM | POA: Diagnosis not present

## 2021-09-07 DIAGNOSIS — N186 End stage renal disease: Secondary | ICD-10-CM | POA: Diagnosis not present

## 2021-09-07 DIAGNOSIS — D631 Anemia in chronic kidney disease: Secondary | ICD-10-CM | POA: Diagnosis not present

## 2021-09-09 DIAGNOSIS — Z992 Dependence on renal dialysis: Secondary | ICD-10-CM | POA: Diagnosis not present

## 2021-09-09 DIAGNOSIS — N186 End stage renal disease: Secondary | ICD-10-CM | POA: Diagnosis not present

## 2021-09-09 DIAGNOSIS — D689 Coagulation defect, unspecified: Secondary | ICD-10-CM | POA: Diagnosis not present

## 2021-09-09 DIAGNOSIS — N2581 Secondary hyperparathyroidism of renal origin: Secondary | ICD-10-CM | POA: Diagnosis not present

## 2021-09-09 DIAGNOSIS — D631 Anemia in chronic kidney disease: Secondary | ICD-10-CM | POA: Diagnosis not present

## 2021-09-09 DIAGNOSIS — D509 Iron deficiency anemia, unspecified: Secondary | ICD-10-CM | POA: Diagnosis not present

## 2021-09-11 DIAGNOSIS — N2581 Secondary hyperparathyroidism of renal origin: Secondary | ICD-10-CM | POA: Diagnosis not present

## 2021-09-11 DIAGNOSIS — N186 End stage renal disease: Secondary | ICD-10-CM | POA: Diagnosis not present

## 2021-09-11 DIAGNOSIS — D509 Iron deficiency anemia, unspecified: Secondary | ICD-10-CM | POA: Diagnosis not present

## 2021-09-11 DIAGNOSIS — D631 Anemia in chronic kidney disease: Secondary | ICD-10-CM | POA: Diagnosis not present

## 2021-09-11 DIAGNOSIS — Z992 Dependence on renal dialysis: Secondary | ICD-10-CM | POA: Diagnosis not present

## 2021-09-14 DIAGNOSIS — D631 Anemia in chronic kidney disease: Secondary | ICD-10-CM | POA: Diagnosis not present

## 2021-09-14 DIAGNOSIS — D509 Iron deficiency anemia, unspecified: Secondary | ICD-10-CM | POA: Diagnosis not present

## 2021-09-14 DIAGNOSIS — Z992 Dependence on renal dialysis: Secondary | ICD-10-CM | POA: Diagnosis not present

## 2021-09-14 DIAGNOSIS — N186 End stage renal disease: Secondary | ICD-10-CM | POA: Diagnosis not present

## 2021-09-14 DIAGNOSIS — N2581 Secondary hyperparathyroidism of renal origin: Secondary | ICD-10-CM | POA: Diagnosis not present

## 2021-09-15 DIAGNOSIS — M25812 Other specified joint disorders, left shoulder: Secondary | ICD-10-CM | POA: Diagnosis not present

## 2021-09-15 DIAGNOSIS — M12812 Other specific arthropathies, not elsewhere classified, left shoulder: Secondary | ICD-10-CM | POA: Insufficient documentation

## 2021-09-15 DIAGNOSIS — M25512 Pain in left shoulder: Secondary | ICD-10-CM | POA: Diagnosis not present

## 2021-09-16 DIAGNOSIS — D509 Iron deficiency anemia, unspecified: Secondary | ICD-10-CM | POA: Diagnosis not present

## 2021-09-16 DIAGNOSIS — D689 Coagulation defect, unspecified: Secondary | ICD-10-CM | POA: Diagnosis not present

## 2021-09-16 DIAGNOSIS — N186 End stage renal disease: Secondary | ICD-10-CM | POA: Diagnosis not present

## 2021-09-16 DIAGNOSIS — Z992 Dependence on renal dialysis: Secondary | ICD-10-CM | POA: Diagnosis not present

## 2021-09-16 DIAGNOSIS — D631 Anemia in chronic kidney disease: Secondary | ICD-10-CM | POA: Diagnosis not present

## 2021-09-16 DIAGNOSIS — N2581 Secondary hyperparathyroidism of renal origin: Secondary | ICD-10-CM | POA: Diagnosis not present

## 2021-09-17 DIAGNOSIS — Z20822 Contact with and (suspected) exposure to covid-19: Secondary | ICD-10-CM | POA: Diagnosis not present

## 2021-09-18 DIAGNOSIS — D509 Iron deficiency anemia, unspecified: Secondary | ICD-10-CM | POA: Diagnosis not present

## 2021-09-18 DIAGNOSIS — Z992 Dependence on renal dialysis: Secondary | ICD-10-CM | POA: Diagnosis not present

## 2021-09-18 DIAGNOSIS — N186 End stage renal disease: Secondary | ICD-10-CM | POA: Diagnosis not present

## 2021-09-18 DIAGNOSIS — N2581 Secondary hyperparathyroidism of renal origin: Secondary | ICD-10-CM | POA: Diagnosis not present

## 2021-09-18 DIAGNOSIS — D631 Anemia in chronic kidney disease: Secondary | ICD-10-CM | POA: Diagnosis not present

## 2021-09-21 DIAGNOSIS — N2581 Secondary hyperparathyroidism of renal origin: Secondary | ICD-10-CM | POA: Diagnosis not present

## 2021-09-21 DIAGNOSIS — D509 Iron deficiency anemia, unspecified: Secondary | ICD-10-CM | POA: Diagnosis not present

## 2021-09-21 DIAGNOSIS — Z992 Dependence on renal dialysis: Secondary | ICD-10-CM | POA: Diagnosis not present

## 2021-09-21 DIAGNOSIS — I120 Hypertensive chronic kidney disease with stage 5 chronic kidney disease or end stage renal disease: Secondary | ICD-10-CM | POA: Diagnosis not present

## 2021-09-21 DIAGNOSIS — N186 End stage renal disease: Secondary | ICD-10-CM | POA: Diagnosis not present

## 2021-09-21 DIAGNOSIS — D689 Coagulation defect, unspecified: Secondary | ICD-10-CM | POA: Diagnosis not present

## 2021-09-21 DIAGNOSIS — D631 Anemia in chronic kidney disease: Secondary | ICD-10-CM | POA: Diagnosis not present

## 2021-09-23 DIAGNOSIS — N186 End stage renal disease: Secondary | ICD-10-CM | POA: Diagnosis not present

## 2021-09-23 DIAGNOSIS — D631 Anemia in chronic kidney disease: Secondary | ICD-10-CM | POA: Diagnosis not present

## 2021-09-23 DIAGNOSIS — N2581 Secondary hyperparathyroidism of renal origin: Secondary | ICD-10-CM | POA: Diagnosis not present

## 2021-09-23 DIAGNOSIS — D509 Iron deficiency anemia, unspecified: Secondary | ICD-10-CM | POA: Diagnosis not present

## 2021-09-23 DIAGNOSIS — Z992 Dependence on renal dialysis: Secondary | ICD-10-CM | POA: Diagnosis not present

## 2021-09-24 DIAGNOSIS — N2581 Secondary hyperparathyroidism of renal origin: Secondary | ICD-10-CM | POA: Diagnosis not present

## 2021-09-24 DIAGNOSIS — D509 Iron deficiency anemia, unspecified: Secondary | ICD-10-CM | POA: Diagnosis not present

## 2021-09-24 DIAGNOSIS — N186 End stage renal disease: Secondary | ICD-10-CM | POA: Diagnosis not present

## 2021-09-24 DIAGNOSIS — D631 Anemia in chronic kidney disease: Secondary | ICD-10-CM | POA: Diagnosis not present

## 2021-09-24 DIAGNOSIS — Z992 Dependence on renal dialysis: Secondary | ICD-10-CM | POA: Diagnosis not present

## 2021-09-28 DIAGNOSIS — N2581 Secondary hyperparathyroidism of renal origin: Secondary | ICD-10-CM | POA: Diagnosis not present

## 2021-09-28 DIAGNOSIS — N186 End stage renal disease: Secondary | ICD-10-CM | POA: Diagnosis not present

## 2021-09-28 DIAGNOSIS — Z992 Dependence on renal dialysis: Secondary | ICD-10-CM | POA: Diagnosis not present

## 2021-09-28 DIAGNOSIS — D509 Iron deficiency anemia, unspecified: Secondary | ICD-10-CM | POA: Diagnosis not present

## 2021-09-28 DIAGNOSIS — D631 Anemia in chronic kidney disease: Secondary | ICD-10-CM | POA: Diagnosis not present

## 2021-09-28 DIAGNOSIS — D689 Coagulation defect, unspecified: Secondary | ICD-10-CM | POA: Diagnosis not present

## 2021-09-30 DIAGNOSIS — D631 Anemia in chronic kidney disease: Secondary | ICD-10-CM | POA: Diagnosis not present

## 2021-09-30 DIAGNOSIS — N186 End stage renal disease: Secondary | ICD-10-CM | POA: Diagnosis not present

## 2021-09-30 DIAGNOSIS — Z992 Dependence on renal dialysis: Secondary | ICD-10-CM | POA: Diagnosis not present

## 2021-09-30 DIAGNOSIS — N2581 Secondary hyperparathyroidism of renal origin: Secondary | ICD-10-CM | POA: Diagnosis not present

## 2021-09-30 DIAGNOSIS — D509 Iron deficiency anemia, unspecified: Secondary | ICD-10-CM | POA: Diagnosis not present

## 2021-10-02 DIAGNOSIS — Z992 Dependence on renal dialysis: Secondary | ICD-10-CM | POA: Diagnosis not present

## 2021-10-02 DIAGNOSIS — N2581 Secondary hyperparathyroidism of renal origin: Secondary | ICD-10-CM | POA: Diagnosis not present

## 2021-10-02 DIAGNOSIS — D631 Anemia in chronic kidney disease: Secondary | ICD-10-CM | POA: Diagnosis not present

## 2021-10-02 DIAGNOSIS — D509 Iron deficiency anemia, unspecified: Secondary | ICD-10-CM | POA: Diagnosis not present

## 2021-10-02 DIAGNOSIS — N186 End stage renal disease: Secondary | ICD-10-CM | POA: Diagnosis not present

## 2021-10-05 DIAGNOSIS — Z992 Dependence on renal dialysis: Secondary | ICD-10-CM | POA: Diagnosis not present

## 2021-10-05 DIAGNOSIS — D631 Anemia in chronic kidney disease: Secondary | ICD-10-CM | POA: Diagnosis not present

## 2021-10-05 DIAGNOSIS — N186 End stage renal disease: Secondary | ICD-10-CM | POA: Diagnosis not present

## 2021-10-05 DIAGNOSIS — N2581 Secondary hyperparathyroidism of renal origin: Secondary | ICD-10-CM | POA: Diagnosis not present

## 2021-10-05 DIAGNOSIS — D509 Iron deficiency anemia, unspecified: Secondary | ICD-10-CM | POA: Diagnosis not present

## 2021-10-07 DIAGNOSIS — N186 End stage renal disease: Secondary | ICD-10-CM | POA: Diagnosis not present

## 2021-10-07 DIAGNOSIS — D689 Coagulation defect, unspecified: Secondary | ICD-10-CM | POA: Diagnosis not present

## 2021-10-07 DIAGNOSIS — D631 Anemia in chronic kidney disease: Secondary | ICD-10-CM | POA: Diagnosis not present

## 2021-10-07 DIAGNOSIS — D509 Iron deficiency anemia, unspecified: Secondary | ICD-10-CM | POA: Diagnosis not present

## 2021-10-07 DIAGNOSIS — N2581 Secondary hyperparathyroidism of renal origin: Secondary | ICD-10-CM | POA: Diagnosis not present

## 2021-10-07 DIAGNOSIS — Z992 Dependence on renal dialysis: Secondary | ICD-10-CM | POA: Diagnosis not present

## 2021-10-09 DIAGNOSIS — D631 Anemia in chronic kidney disease: Secondary | ICD-10-CM | POA: Diagnosis not present

## 2021-10-09 DIAGNOSIS — Z992 Dependence on renal dialysis: Secondary | ICD-10-CM | POA: Diagnosis not present

## 2021-10-09 DIAGNOSIS — N186 End stage renal disease: Secondary | ICD-10-CM | POA: Diagnosis not present

## 2021-10-09 DIAGNOSIS — N2581 Secondary hyperparathyroidism of renal origin: Secondary | ICD-10-CM | POA: Diagnosis not present

## 2021-10-09 DIAGNOSIS — D509 Iron deficiency anemia, unspecified: Secondary | ICD-10-CM | POA: Diagnosis not present

## 2021-10-12 DIAGNOSIS — N186 End stage renal disease: Secondary | ICD-10-CM | POA: Diagnosis not present

## 2021-10-12 DIAGNOSIS — N2581 Secondary hyperparathyroidism of renal origin: Secondary | ICD-10-CM | POA: Diagnosis not present

## 2021-10-12 DIAGNOSIS — D631 Anemia in chronic kidney disease: Secondary | ICD-10-CM | POA: Diagnosis not present

## 2021-10-12 DIAGNOSIS — D509 Iron deficiency anemia, unspecified: Secondary | ICD-10-CM | POA: Diagnosis not present

## 2021-10-12 DIAGNOSIS — Z992 Dependence on renal dialysis: Secondary | ICD-10-CM | POA: Diagnosis not present

## 2021-10-12 DIAGNOSIS — D689 Coagulation defect, unspecified: Secondary | ICD-10-CM | POA: Diagnosis not present

## 2021-10-13 ENCOUNTER — Other Ambulatory Visit: Payer: Self-pay

## 2021-10-13 MED ORDER — ONETOUCH DELICA PLUS LANCET33G MISC: 3 refills | Status: AC

## 2021-10-13 MED ORDER — GLUCOSE BLOOD VI STRP: ORAL_STRIP | 12 refills | Status: AC

## 2021-10-14 DIAGNOSIS — N2581 Secondary hyperparathyroidism of renal origin: Secondary | ICD-10-CM | POA: Diagnosis not present

## 2021-10-14 DIAGNOSIS — D631 Anemia in chronic kidney disease: Secondary | ICD-10-CM | POA: Diagnosis not present

## 2021-10-14 DIAGNOSIS — D509 Iron deficiency anemia, unspecified: Secondary | ICD-10-CM | POA: Diagnosis not present

## 2021-10-14 DIAGNOSIS — Z992 Dependence on renal dialysis: Secondary | ICD-10-CM | POA: Diagnosis not present

## 2021-10-14 DIAGNOSIS — N186 End stage renal disease: Secondary | ICD-10-CM | POA: Diagnosis not present

## 2021-10-16 ENCOUNTER — Other Ambulatory Visit: Payer: Self-pay

## 2021-10-16 ENCOUNTER — Telehealth: Payer: Self-pay

## 2021-10-16 DIAGNOSIS — N186 End stage renal disease: Secondary | ICD-10-CM | POA: Diagnosis not present

## 2021-10-16 DIAGNOSIS — D509 Iron deficiency anemia, unspecified: Secondary | ICD-10-CM | POA: Diagnosis not present

## 2021-10-16 DIAGNOSIS — N2581 Secondary hyperparathyroidism of renal origin: Secondary | ICD-10-CM | POA: Diagnosis not present

## 2021-10-16 DIAGNOSIS — Z992 Dependence on renal dialysis: Secondary | ICD-10-CM | POA: Diagnosis not present

## 2021-10-16 DIAGNOSIS — D631 Anemia in chronic kidney disease: Secondary | ICD-10-CM | POA: Diagnosis not present

## 2021-10-16 NOTE — Chronic Care Management (AMB) (Unsigned)
Chronic Care Management Pharmacy Assistant   Name: [name]  MRN: [id number] DOB: [birth year]  Reason for Encounter: Disease State/ Hypertension  Recent office visits:  07-07-2021 [name], [place]. Trig= 231. No changes.  Recent consult visits:  09-15-2021 Geralynn Rile (Orthopedic surgery). Unable to view encounter.  08-25-2021 Johnn Hai (Orthopedic surgery). Unable to view encounter.  07-21-2021 Bronson Ing, DPM (Podiatry).  Mechanical debridement of nails 1-5  bilaterally performed with a nail nipper.  Filed with dremel without incident.   06-30-2021 Johnn Hai (Orthopedic surgery). Unable to view encounter.  05-28-2021 Deno Lunger ([place]). Unable to view encounter  05-19-2021 Melina Schools D (Orthopedic surgery). Unable to view encounter.  05-05-2021 Hunsucker, Bonna Gains, MD (Pulmonary). CHANGE breo ellipta 1 puff 2 times daily PRN TO 1 puff daily.   Hospital visits:  None in previous 6 months  Medications: Outpatient Encounter Medications as of 10/16/2021  Medication Sig Note   albuterol (VENTOLIN HFA) 108 (90 Base) MCG/ACT inhaler Inhale 2 puffs into the lungs every 6 (six) hours as needed for wheezing or shortness of breath.    amiodarone (PACERONE) 200 MG tablet Take 1 tablet (200 mg total) by mouth 2 (two) times daily.    calcium acetate (PHOSLO) 667 MG capsule Take 1-3 capsules (667-2,001 mg total) by mouth See admin instructions. Take 2001 mg capsules with meals three times daily and 667 mg capsule with snacks. (Patient taking differently: Take 2,001 mg by mouth daily.)    chlorhexidine (PERIDEX) 0.12 % solution 15 mLs by Mouth Rinse route 2 (two) times daily.    cinacalcet (SENSIPAR) 90 MG tablet Take 180 mg by mouth daily.    Control Gel Formula Dressing (DUODERM CGF EXTRA THIN) MISC Apply 1 each topically daily as needed.    diclofenac Sodium (VOLTAREN) 1 % GEL Apply 2 g topically 4 (four) times daily.    digoxin (LANOXIN)  0.125 MG tablet Take 1 tablet (0.125 mg total) by mouth every Monday, Wednesday, and Friday.    ferric citrate (AURYXIA) 1 GM 210 MG(Fe) tablet Take 630 mg by mouth 3 (three) times daily with meals.    fluticasone furoate-vilanterol (BREO ELLIPTA) 100-25 MCG/ACT AEPB Inhale 1 puff into the lungs daily.    gabapentin (NEURONTIN) 100 MG capsule Take 1 capsule (100 mg total) by mouth 3 (three) times daily as needed (coughing uncontrollably).    glucose blood test strip Use to check blood sugar 3 times a day. Dx code e11.65    HYDROcodone bit-homatropine (HYDROMET) 5-1.5 MG/5ML syrup Take 5 mLs by mouth every 6 (six) hours as needed for cough.    ipratropium (ATROVENT) 0.06 % nasal spray Place 2 sprays into both nostrils 4 (four) times daily. (Patient taking differently: Place 2 sprays into both nostrils 4 (four) times daily as needed for rhinitis.)    isosorbide dinitrate (ISORDIL) 10 MG tablet Take 10 mg by mouth 2 (two) times daily.    Lactobacillus (ACIDOPHILUS/BIFIDUS PO) Take 1 capsule by mouth daily. Written by Dr. Collene Mares to take once per day.    Lancets (ONETOUCH DELICA PLUS ZWCHEN27P) MISC Use to check blood sugar 3 times a day. Dx code e11.65    lidocaine-prilocaine (EMLA) cream Apply 1 application topically See admin instructions. Apply topically to port access prior to dialysis - Monday, Wednesday, Friday.    loperamide (IMODIUM A-D) 2 MG tablet Take 1 tablet (2 mg total) by mouth 4 (four) times daily as needed for diarrhea or loose stools.  Also available OTC.    meclizine (ANTIVERT) 25 MG tablet Take 1 tablet (25 mg total) by mouth 3 (three) times daily as needed for dizziness.    metoprolol succinate (TOPROL-XL) 25 MG 24 hr tablet Take 1 tablet (25 mg total) by mouth daily.    metoprolol tartrate (LOPRESSOR) 25 MG tablet Take 25 mg by mouth 2 (two) times daily.    midodrine (PROAMATINE) 10 MG tablet Take 1 tablet (10 mg total) by mouth 3 (three) times daily.    multivitamin (RENA-VIT) TABS  tablet Take 1 tablet by mouth at bedtime.    nitroGLYCERIN (NITROSTAT) 0.4 MG SL tablet Place 0.4 mg under the tongue every 5 (five) minutes as needed for chest pain.     Nutritional Supplements (FEEDING SUPPLEMENT, NEPRO CARB STEADY,) LIQD Take 237 mLs by mouth daily.    ondansetron (ZOFRAN ODT) 4 MG disintegrating tablet Take 1 tablet (4 mg total) by mouth every 8 (eight) hours as needed for nausea or vomiting.    oxyCODONE-acetaminophen (PERCOCET/ROXICET) 5-325 MG tablet Take 1 tablet by mouth every 4 (four) hours as needed for severe pain.    pantoprazole (PROTONIX) 40 MG tablet Take 40 mg by mouth 2 (two) times daily.    sucroferric oxyhydroxide (VELPHORO) 500 MG chewable tablet Chew 500-1,000 mg by mouth See admin instructions. Takes 2 tablets with each meal and 1 tablet with snacks.    traMADol (ULTRAM) 50 MG tablet Take by mouth.    UNABLE TO FIND Out patient physical therapy- vestibular therapy for BPPV    warfarin (COUMADIN) 7.5 MG tablet Take 7.5 mg by mouth daily. 01/29/2021: Taking 1 and 1/2 tablets once per day at 6 PM. Does INR drawing at dialysis and give results to the heart doctor on Friday, once per week.    No facility-administered encounter medications on file as of 10/16/2021.  Reviewed chart prior to disease state call. Spoke with patient regarding BP  Recent Office Vitals: BP Readings from Last 3 Encounters:  07/07/21 102/72  05/05/21 128/70  04/08/21 114/80   Pulse Readings from Last 3 Encounters:  07/07/21 85  05/05/21 79  04/08/21 84    Wt Readings from Last 3 Encounters:  07/07/21 243 lb (110.2 kg)  05/05/21 243 lb (110.2 kg)  12/25/20 237 lb 6.4 oz (107.7 kg)     Kidney Function Lab Results  Component Value Date/Time   CREATININE 5.79 (H) 04/08/2021 10:53 AM   CREATININE 7.15 (H) 06/07/2020 03:57 AM   GFRNONAA 11 (L) 04/08/2021 10:53 AM   GFRAA 6 (L) 11/15/2019 10:12 AM       Latest Ref Rng & Units 04/08/2021   10:53 AM 08/13/2020   12:00 AM  06/07/2020    3:57 AM  BMP  Glucose 70 - 99 mg/dL 91    87    BUN 6 - 20 mg/dL 17    23    Creatinine 0.61 - 1.24 mg/dL 5.79    7.15    Sodium 135 - 145 mmol/L 139    135    Potassium 3.5 - 5.1 mmol/L 3.7    4.3    Chloride 98 - 111 mmol/L 97    95    CO2 22 - 32 mmol/L 30    25    Calcium 8.9 - 10.3 mg/dL 9.2   10.3      8.6       This result is from an external source.    Current antihypertensive regimen:  Metoprolol 25 mg  daily Amiodarone 200 mg tablet twice daily  How often are you checking your Blood Pressure? {CHL HP BP Monitoring [phone]} Current home BP readings: *** What recent interventions/DTPs have been made by any provider to improve Blood Pressure control since last CPP Visit:  Educated on Daily salt intake goal < 2300 mg; Symptoms of hypotension and importance of maintaining adequate hydration; -Counseled to monitor BP at home everyday, document, and provide log at future appointments -Recommended to continue current medication  Any recent hospitalizations or ED visits since last visit with CPP? Yes  What diet changes have been made to improve Blood Pressure Control?  *** What exercise is being done to improve your Blood Pressure Control?  ***  Adherence Review: Is the patient currently on ACE/ARB medication? {yes/no:20286} Does the patient have >5 day gap between last estimated fill dates? {yes/no:20286}  10-16-2021: 1st attempt. No VM  Care Gaps: Yearly ophthalmology overdue Shingrix overdue Yearly foot exam  overdue  Star Rating Drugs: None  [place] Clinical Pharmacist Assistant [phone]

## 2021-10-19 DIAGNOSIS — D631 Anemia in chronic kidney disease: Secondary | ICD-10-CM | POA: Diagnosis not present

## 2021-10-19 DIAGNOSIS — N186 End stage renal disease: Secondary | ICD-10-CM | POA: Diagnosis not present

## 2021-10-19 DIAGNOSIS — N2581 Secondary hyperparathyroidism of renal origin: Secondary | ICD-10-CM | POA: Diagnosis not present

## 2021-10-19 DIAGNOSIS — D689 Coagulation defect, unspecified: Secondary | ICD-10-CM | POA: Diagnosis not present

## 2021-10-19 DIAGNOSIS — D509 Iron deficiency anemia, unspecified: Secondary | ICD-10-CM | POA: Diagnosis not present

## 2021-10-19 DIAGNOSIS — Z992 Dependence on renal dialysis: Secondary | ICD-10-CM | POA: Diagnosis not present

## 2021-10-20 ENCOUNTER — Encounter: Payer: Self-pay | Admitting: Podiatry

## 2021-10-20 ENCOUNTER — Ambulatory Visit (INDEPENDENT_AMBULATORY_CARE_PROVIDER_SITE_OTHER): Payer: Medicare Other | Admitting: Podiatry

## 2021-10-20 DIAGNOSIS — M79674 Pain in right toe(s): Secondary | ICD-10-CM

## 2021-10-20 DIAGNOSIS — N186 End stage renal disease: Secondary | ICD-10-CM | POA: Diagnosis not present

## 2021-10-20 DIAGNOSIS — R072 Precordial pain: Secondary | ICD-10-CM | POA: Diagnosis not present

## 2021-10-20 DIAGNOSIS — I5022 Chronic systolic (congestive) heart failure: Secondary | ICD-10-CM | POA: Diagnosis not present

## 2021-10-20 DIAGNOSIS — M79675 Pain in left toe(s): Secondary | ICD-10-CM

## 2021-10-20 DIAGNOSIS — Z7901 Long term (current) use of anticoagulants: Secondary | ICD-10-CM | POA: Diagnosis not present

## 2021-10-20 DIAGNOSIS — E119 Type 2 diabetes mellitus without complications: Secondary | ICD-10-CM

## 2021-10-20 DIAGNOSIS — B351 Tinea unguium: Secondary | ICD-10-CM | POA: Diagnosis not present

## 2021-10-20 DIAGNOSIS — I2510 Atherosclerotic heart disease of native coronary artery without angina pectoris: Secondary | ICD-10-CM | POA: Diagnosis not present

## 2021-10-20 NOTE — Progress Notes (Signed)
This patient returns to my office for at risk foot care.  This patient requires this care by a professional since this patient will be at risk due to having DM, chronic anticoagulation and ESRD.  This patient is unable to cut nails himself since the patient cannot reach his nails.These nails are painful walking and wearing shoes.  This patient presents for at risk foot care today.  General Appearance  Alert, conversant and in no acute stress.  Vascular  Dorsalis pedis and posterior tibial  pulses are palpable  bilaterally.  Capillary return is within normal limits  bilaterally. Temperature is within normal limits  bilaterally.  Neurologic  Senn-Weinstein monofilament wire test within normal limits  bilaterally. Muscle power within normal limits bilaterally.  Nails Thick disfigured discolored nails with subungual debris  from hallux to fifth toes bilaterally. No evidence of bacterial infection or drainage bilaterally.  Orthopedic  No limitations of motion  feet .  No crepitus or effusions noted.  No bony pathology or digital deformities noted.  Skin  normotropic skin with no porokeratosis noted bilaterally.  No signs of infections or ulcers noted.     Onychomycosis  Pain in right toes  Pain in left toes  Consent was obtained for treatment procedures.   Mechanical debridement of nails 1-5  bilaterally performed with a nail nipper.  Filed with dremel without incident.    Return office visit     3 months                 Told patient to return for periodic foot care and evaluation due to potential at risk complications.   Mattalyn Anderegg DPM   

## 2021-10-21 DIAGNOSIS — D509 Iron deficiency anemia, unspecified: Secondary | ICD-10-CM | POA: Diagnosis not present

## 2021-10-21 DIAGNOSIS — Z992 Dependence on renal dialysis: Secondary | ICD-10-CM | POA: Diagnosis not present

## 2021-10-21 DIAGNOSIS — D631 Anemia in chronic kidney disease: Secondary | ICD-10-CM | POA: Diagnosis not present

## 2021-10-21 DIAGNOSIS — N2581 Secondary hyperparathyroidism of renal origin: Secondary | ICD-10-CM | POA: Diagnosis not present

## 2021-10-21 DIAGNOSIS — N186 End stage renal disease: Secondary | ICD-10-CM | POA: Diagnosis not present

## 2021-10-22 DIAGNOSIS — I120 Hypertensive chronic kidney disease with stage 5 chronic kidney disease or end stage renal disease: Secondary | ICD-10-CM | POA: Diagnosis not present

## 2021-10-22 DIAGNOSIS — Z992 Dependence on renal dialysis: Secondary | ICD-10-CM | POA: Diagnosis not present

## 2021-10-22 DIAGNOSIS — N186 End stage renal disease: Secondary | ICD-10-CM | POA: Diagnosis not present

## 2021-10-23 DIAGNOSIS — D631 Anemia in chronic kidney disease: Secondary | ICD-10-CM | POA: Diagnosis not present

## 2021-10-23 DIAGNOSIS — Z992 Dependence on renal dialysis: Secondary | ICD-10-CM | POA: Diagnosis not present

## 2021-10-23 DIAGNOSIS — N2581 Secondary hyperparathyroidism of renal origin: Secondary | ICD-10-CM | POA: Diagnosis not present

## 2021-10-23 DIAGNOSIS — D509 Iron deficiency anemia, unspecified: Secondary | ICD-10-CM | POA: Diagnosis not present

## 2021-10-23 DIAGNOSIS — N186 End stage renal disease: Secondary | ICD-10-CM | POA: Diagnosis not present

## 2021-10-26 DIAGNOSIS — Z992 Dependence on renal dialysis: Secondary | ICD-10-CM | POA: Diagnosis not present

## 2021-10-26 DIAGNOSIS — D631 Anemia in chronic kidney disease: Secondary | ICD-10-CM | POA: Diagnosis not present

## 2021-10-26 DIAGNOSIS — N2581 Secondary hyperparathyroidism of renal origin: Secondary | ICD-10-CM | POA: Diagnosis not present

## 2021-10-26 DIAGNOSIS — D689 Coagulation defect, unspecified: Secondary | ICD-10-CM | POA: Diagnosis not present

## 2021-10-26 DIAGNOSIS — N186 End stage renal disease: Secondary | ICD-10-CM | POA: Diagnosis not present

## 2021-10-26 DIAGNOSIS — D509 Iron deficiency anemia, unspecified: Secondary | ICD-10-CM | POA: Diagnosis not present

## 2021-10-28 DIAGNOSIS — Z992 Dependence on renal dialysis: Secondary | ICD-10-CM | POA: Diagnosis not present

## 2021-10-28 DIAGNOSIS — D631 Anemia in chronic kidney disease: Secondary | ICD-10-CM | POA: Diagnosis not present

## 2021-10-28 DIAGNOSIS — N2581 Secondary hyperparathyroidism of renal origin: Secondary | ICD-10-CM | POA: Diagnosis not present

## 2021-10-28 DIAGNOSIS — D509 Iron deficiency anemia, unspecified: Secondary | ICD-10-CM | POA: Diagnosis not present

## 2021-10-28 DIAGNOSIS — N186 End stage renal disease: Secondary | ICD-10-CM | POA: Diagnosis not present

## 2021-10-30 DIAGNOSIS — D631 Anemia in chronic kidney disease: Secondary | ICD-10-CM | POA: Diagnosis not present

## 2021-10-30 DIAGNOSIS — Z992 Dependence on renal dialysis: Secondary | ICD-10-CM | POA: Diagnosis not present

## 2021-10-30 DIAGNOSIS — D509 Iron deficiency anemia, unspecified: Secondary | ICD-10-CM | POA: Diagnosis not present

## 2021-10-30 DIAGNOSIS — N2581 Secondary hyperparathyroidism of renal origin: Secondary | ICD-10-CM | POA: Diagnosis not present

## 2021-10-30 DIAGNOSIS — N186 End stage renal disease: Secondary | ICD-10-CM | POA: Diagnosis not present

## 2021-11-02 DIAGNOSIS — D689 Coagulation defect, unspecified: Secondary | ICD-10-CM | POA: Diagnosis not present

## 2021-11-02 DIAGNOSIS — N2581 Secondary hyperparathyroidism of renal origin: Secondary | ICD-10-CM | POA: Diagnosis not present

## 2021-11-02 DIAGNOSIS — D509 Iron deficiency anemia, unspecified: Secondary | ICD-10-CM | POA: Diagnosis not present

## 2021-11-02 DIAGNOSIS — N186 End stage renal disease: Secondary | ICD-10-CM | POA: Diagnosis not present

## 2021-11-02 DIAGNOSIS — Z992 Dependence on renal dialysis: Secondary | ICD-10-CM | POA: Diagnosis not present

## 2021-11-02 DIAGNOSIS — D631 Anemia in chronic kidney disease: Secondary | ICD-10-CM | POA: Diagnosis not present

## 2021-11-04 DIAGNOSIS — N2581 Secondary hyperparathyroidism of renal origin: Secondary | ICD-10-CM | POA: Diagnosis not present

## 2021-11-04 DIAGNOSIS — D509 Iron deficiency anemia, unspecified: Secondary | ICD-10-CM | POA: Diagnosis not present

## 2021-11-04 DIAGNOSIS — D631 Anemia in chronic kidney disease: Secondary | ICD-10-CM | POA: Diagnosis not present

## 2021-11-04 DIAGNOSIS — N186 End stage renal disease: Secondary | ICD-10-CM | POA: Diagnosis not present

## 2021-11-04 DIAGNOSIS — Z992 Dependence on renal dialysis: Secondary | ICD-10-CM | POA: Diagnosis not present

## 2021-11-06 DIAGNOSIS — D631 Anemia in chronic kidney disease: Secondary | ICD-10-CM | POA: Diagnosis not present

## 2021-11-06 DIAGNOSIS — D509 Iron deficiency anemia, unspecified: Secondary | ICD-10-CM | POA: Diagnosis not present

## 2021-11-06 DIAGNOSIS — Z992 Dependence on renal dialysis: Secondary | ICD-10-CM | POA: Diagnosis not present

## 2021-11-06 DIAGNOSIS — N186 End stage renal disease: Secondary | ICD-10-CM | POA: Diagnosis not present

## 2021-11-06 DIAGNOSIS — N2581 Secondary hyperparathyroidism of renal origin: Secondary | ICD-10-CM | POA: Diagnosis not present

## 2021-11-09 DIAGNOSIS — Z992 Dependence on renal dialysis: Secondary | ICD-10-CM | POA: Diagnosis not present

## 2021-11-09 DIAGNOSIS — D509 Iron deficiency anemia, unspecified: Secondary | ICD-10-CM | POA: Diagnosis not present

## 2021-11-09 DIAGNOSIS — N186 End stage renal disease: Secondary | ICD-10-CM | POA: Diagnosis not present

## 2021-11-09 DIAGNOSIS — D689 Coagulation defect, unspecified: Secondary | ICD-10-CM | POA: Diagnosis not present

## 2021-11-09 DIAGNOSIS — D631 Anemia in chronic kidney disease: Secondary | ICD-10-CM | POA: Diagnosis not present

## 2021-11-09 DIAGNOSIS — N2581 Secondary hyperparathyroidism of renal origin: Secondary | ICD-10-CM | POA: Diagnosis not present

## 2021-11-10 ENCOUNTER — Encounter: Payer: Self-pay | Admitting: Nurse Practitioner

## 2021-11-10 ENCOUNTER — Ambulatory Visit (INDEPENDENT_AMBULATORY_CARE_PROVIDER_SITE_OTHER): Payer: Medicare Other | Admitting: Nurse Practitioner

## 2021-11-10 VITALS — BP 106/64 | HR 93 | Temp 98.0°F | Ht 75.0 in | Wt 232.6 lb

## 2021-11-10 DIAGNOSIS — Z992 Dependence on renal dialysis: Secondary | ICD-10-CM

## 2021-11-10 DIAGNOSIS — E1122 Type 2 diabetes mellitus with diabetic chronic kidney disease: Secondary | ICD-10-CM

## 2021-11-10 DIAGNOSIS — G8929 Other chronic pain: Secondary | ICD-10-CM

## 2021-11-10 DIAGNOSIS — R200 Anesthesia of skin: Secondary | ICD-10-CM | POA: Diagnosis not present

## 2021-11-10 DIAGNOSIS — I132 Hypertensive heart and chronic kidney disease with heart failure and with stage 5 chronic kidney disease, or end stage renal disease: Secondary | ICD-10-CM

## 2021-11-10 DIAGNOSIS — R21 Rash and other nonspecific skin eruption: Secondary | ICD-10-CM

## 2021-11-10 DIAGNOSIS — Z23 Encounter for immunization: Secondary | ICD-10-CM

## 2021-11-10 DIAGNOSIS — E782 Mixed hyperlipidemia: Secondary | ICD-10-CM | POA: Diagnosis not present

## 2021-11-10 DIAGNOSIS — M25511 Pain in right shoulder: Secondary | ICD-10-CM | POA: Diagnosis not present

## 2021-11-10 DIAGNOSIS — N186 End stage renal disease: Secondary | ICD-10-CM

## 2021-11-10 DIAGNOSIS — E559 Vitamin D deficiency, unspecified: Secondary | ICD-10-CM

## 2021-11-10 DIAGNOSIS — M25512 Pain in left shoulder: Secondary | ICD-10-CM | POA: Diagnosis not present

## 2021-11-10 DIAGNOSIS — R202 Paresthesia of skin: Secondary | ICD-10-CM

## 2021-11-10 DIAGNOSIS — Z993 Dependence on wheelchair: Secondary | ICD-10-CM

## 2021-11-10 MED ORDER — GABAPENTIN 300 MG PO CAPS: 300.0000 mg | ORAL_CAPSULE | Freq: Three times a day (TID) | ORAL | 1 refills | Status: DC

## 2021-11-10 MED ORDER — OXYCODONE-ACETAMINOPHEN 5-325 MG PO TABS: 1.0000 | ORAL_TABLET | ORAL | 0 refills | Status: DC | PRN

## 2021-11-10 NOTE — Patient Instructions (Signed)
Diabetes Mellitus and Exercise ?Exercising regularly is important for overall health, especially for people who have diabetes mellitus. Exercising is not only about losing weight. It has many other health benefits, such as increasing muscle strength and bone density and reducing body fat and stress. This leads to improved fitness, flexibility, and endurance, all of which result in better overall health. ?What are the benefits of exercise if I have diabetes? ?Exercise has many benefits for people with diabetes. They include: ?Helping to lower and control blood sugar (glucose). ?Helping the body to respond better to the hormone insulin by improving insulin sensitivity. ?Reducing how much insulin the body needs. ?Lowering the risk for heart disease by: ?Lowering "bad" cholesterol and triglyceride levels. ?Increasing "good" cholesterol levels. ?Lowering blood pressure. ?Lowering blood glucose levels. ?What is my activity plan? ?Your health care provider or certified diabetes educator can help you make a plan for the type and frequency of exercise that works for you. This is called your activity plan. Be sure to: ?Get at least 150 minutes of medium-intensity or high-intensity exercise each week. Exercises may include brisk walking, biking, or water aerobics. ?Do stretching and strengthening exercises, such as yoga or weight lifting, at least 2 times a week. ?Spread out your activity over at least 3 days of the week. ?Get some form of physical activity each day. ?Do not go more than 2 days in a row without some kind of physical activity. ?Avoid being inactive for more than 90 minutes at a time. Take frequent breaks to walk or stretch. ?Choose exercises or activities that you enjoy. Set realistic goals. ?Start slowly and gradually increase your exercise intensity over time. ?How do I manage my diabetes during exercise? ? ?Monitor your blood glucose ?Check your blood glucose before and after exercising. If your blood  glucose is: ?240 mg/dL (13.3 mmol/L) or higher before you exercise, check your urine for ketones. These are chemicals created by the liver. If you have ketones in your urine, do not exercise until your blood glucose returns to normal. ?100 mg/dL (5.6 mmol/L) or lower, eat a snack containing 15-20 grams of carbohydrate. Check your blood glucose 15 minutes after the snack to make sure that your glucose level is above 100 mg/dL (5.6 mmol/L) before you start your exercise. ?Know the symptoms of low blood glucose (hypoglycemia) and how to treat it. Your risk for hypoglycemia increases during and after exercise. ?Follow these tips and your health care provider's instructions ?Keep a carbohydrate snack that is fast-acting for use before, during, and after exercise to help prevent or treat hypoglycemia. ?Avoid injecting insulin into areas of the body that are going to be exercised. For example, avoid injecting insulin into: ?Your arms, when you are about to play tennis. ?Your legs, when you are about to go jogging. ?Keep records of your exercise habits. Doing this can help you and your health care provider adjust your diabetes management plan as needed. Write down: ?Food that you eat before and after you exercise. ?Blood glucose levels before and after you exercise. ?The type and amount of exercise you have done. ?Work with your health care provider when you start a new exercise or activity. He or she may need to: ?Make sure that the activity is safe for you. ?Adjust your insulin, other medicines, and food that you eat. ?Drink plenty of water while you exercise. This prevents loss of water (dehydration) and problems caused by a lot of heat in the body (heat stroke). ?Where to find more   information ?American Diabetes Association: www.diabetes.org ?Summary ?Exercising regularly is important for overall health, especially for people who have diabetes mellitus. ?Exercising has many health benefits. It increases muscle strength  and bone density and reduces body fat and stress. It also lowers and controls blood glucose. ?Your health care provider or certified diabetes educator can help you make an activity plan for the type and frequency of exercise that works for you. ?Work with your health care provider to make sure any new activity is safe for you. Also work with your health care provider to adjust your insulin, other medicines, and the food you eat. ?This information is not intended to replace advice given to you by your health care provider. Make sure you discuss any questions you have with your health care provider. ?Document Revised: 02/05/2019 Document Reviewed: 02/05/2019 ?Elsevier Patient Education ? 2023 [employer] ? ?

## 2021-11-10 NOTE — Progress Notes (Signed)
I,[name],acting as a Education administrator for [employer], FNP.,have documented all relevant documentation on the behalf of Matthew Brine, FNP,as directed by  Matthew Brine, FNP while in the presence of [name], [place].  This visit occurred during the SARS-CoV-2 public health emergency.  Safety protocols were in place, including screening questions prior to the visit, additional usage of staff PPE, and extensive cleaning of exam room while observing appropriate contact time as indicated for disinfecting solutions.  Subjective:     Patient ID: [name] , male    DOB: 12-15-1960 , 61 y.o.   MRN: [id number]   Chief Complaint  Patient presents with   Diabetes    Patient is here today for a diabetes follow up.     HPI  Patient is here for a diabetes follow up. Continues with dialysis MWF. He is complaining of bilateral shoulder pain and has been seen by [facility] - he has a tear to the rotator cuff and bone on bone both shoulders he is not a candidate for surgery due to his heart being too weak. Last taken at the beginning of June. Has been more than 3 months with the shoulder pain and being seen with [facility].  He had been going to [facility] until he went to [facility] and that is when they said it is not anything else they can do. [facility] is a patient at [facility] and back. He has been having injections to his shoulders from [facility] next due in July.  His knees are more weak and balance is off. He gets winded when walking long distances.   Diabetes He presents for his follow-up diabetic visit. He has type 2 diabetes mellitus. There are no hypoglycemic associated symptoms. There are no diabetic associated symptoms. There are no hypoglycemic complications. There are no diabetic complications. Risk factors for coronary artery disease include obesity, sedentary lifestyle, male sex and diabetes mellitus. (He will spot check his blood sugar was 102 last time  he checked. )     Past Medical History:  Diagnosis Date   Acute lower GI bleeding 06/21/2018   Acute respiratory failure with hypoxia (HCC) 01/13/2015   Atrial flutter (HCC)    Bile leak, postoperative 03/15/2016   Biliary dyskinesia 02/12/2016   Blind right eye    Hx: of partial blindness in right eye   Cardiomyopathy    CHF (congestive heart failure) ([place])    Coronary artery disease    normal coronaries by 10/10/08 cath, Cardiac Cath 08-04-12 epic.Dr. Doylene Canard follows   Diabetes mellitus    pt. states he's borderline diabetic., no longer taking med,- off med. since 2013   Dialysis patient [facility])    Mon-Wed-Fri([place])- Left AV fistula   Diverticulitis November 2016   reoccurred in December 2016   Diverticulitis of intestine without perforation or abscess without bleeding 04/21/2015   DVT (deep venous thrombosis) (HCC)    ESRD (end stage renal disease) ([place])    GERD (gastroesophageal reflux disease)    Gout    Hemorrhage of left kidney 01/13/2018   History of nephrectomy 07/04/2012   History of right nephrectomy in 2002 for renal cell carcinoma    History of unilateral nephrectomy    Hypertension    Low iron    Myocardial infarction [facility]) ?2006   Renal cell carcinoma    dialysis- MWF- [address]- Dr. Mercy Ranya Fiddler follows.   Renal insufficiency    Shortness of breath 05/19/11   "  at rest, lying down, w/exertion"   Stroke [facility]) 02/2011   05/19/11 denies residual   Umbilical hernia 53/74/82   unrepaired   Wears glasses      Family History  Problem Relation Age of Onset   Hypertension Mother    Kidney disease Mother      Current Outpatient Medications:    albuterol (VENTOLIN HFA) 108 (90 Base) MCG/ACT inhaler, Inhale 2 puffs into the lungs every 6 (six) hours as needed for wheezing or shortness of breath., Disp: 18 g, Rfl: 2   calcium acetate (PHOSLO) 667 MG capsule, Take 1-3 capsules (667-2,001 mg total) by mouth See admin instructions. Take 2001 mg  capsules with meals three times daily and 667 mg capsule with snacks. (Patient taking differently: Take 2,001 mg by mouth daily.), Disp:  , Rfl:    chlorhexidine (PERIDEX) 0.12 % solution, 15 mLs by Mouth Rinse route 2 (two) times daily., Disp: , Rfl:    cinacalcet (SENSIPAR) 90 MG tablet, Take 180 mg by mouth daily., Disp: , Rfl:    Control Gel Formula Dressing (DUODERM CGF EXTRA THIN) MISC, Apply 1 each topically daily as needed., Disp: 10 each, Rfl: 2   diclofenac Sodium (VOLTAREN) 1 % GEL, Apply 2 g topically 4 (four) times daily., Disp: 100 g, Rfl: 2   digoxin (LANOXIN) 0.125 MG tablet, Take 1 tablet (0.125 mg total) by mouth every Monday, Wednesday, and Friday., Disp: 30 tablet, Rfl: 1   ferric citrate (AURYXIA) 1 GM 210 MG(Fe) tablet, Take 630 mg by mouth 3 (three) times daily with meals., Disp: , Rfl:    fluticasone furoate-vilanterol (BREO ELLIPTA) 100-25 MCG/ACT AEPB, Inhale 1 puff into the lungs daily., Disp: 60 each, Rfl: 6   glucose blood test strip, Use to check blood sugar 3 times a day. Dx code e11.65, Disp: 100 each, Rfl: 12   HYDROcodone bit-homatropine (HYDROMET) 5-1.5 MG/5ML syrup, Take 5 mLs by mouth every 6 (six) hours as needed for cough., Disp: 120 mL, Rfl: 0   ipratropium (ATROVENT) 0.06 % nasal spray, Place 2 sprays into both nostrils 4 (four) times daily. (Patient taking differently: Place 2 sprays into both nostrils 4 (four) times daily as needed for rhinitis.), Disp: 15 mL, Rfl: 12   isosorbide dinitrate (ISORDIL) 10 MG tablet, Take 10 mg by mouth 2 (two) times daily., Disp: , Rfl:    Lactobacillus (ACIDOPHILUS/BIFIDUS PO), Take 1 capsule by mouth daily. Written by Dr. Collene Mares to take once per day., Disp: , Rfl:    Lancets (ONETOUCH DELICA PLUS LMBEML54G) MISC, Use to check blood sugar 3 times a day. Dx code e11.65, Disp: 100 each, Rfl: 3   lidocaine-prilocaine (EMLA) cream, Apply 1 application topically See admin instructions. Apply topically to port access prior to dialysis  - Monday, Wednesday, Friday., Disp: , Rfl: 12   loperamide (IMODIUM A-D) 2 MG tablet, Take 1 tablet (2 mg total) by mouth 4 (four) times daily as needed for diarrhea or loose stools. Also available OTC., Disp: 30 tablet, Rfl: 0   meclizine (ANTIVERT) 25 MG tablet, Take 1 tablet (25 mg total) by mouth 3 (three) times daily as needed for dizziness., Disp: 30 tablet, Rfl: 0   metoprolol succinate (TOPROL-XL) 25 MG 24 hr tablet, Take 1 tablet (25 mg total) by mouth daily., Disp: 30 tablet, Rfl: 3   metoprolol tartrate (LOPRESSOR) 25 MG tablet, Take 25 mg by mouth 2 (two) times daily., Disp: , Rfl:    midodrine (PROAMATINE) 10 MG tablet, Take 1 tablet (  10 mg total) by mouth 3 (three) times daily., Disp: 90 tablet, Rfl: 3   multivitamin (RENA-VIT) TABS tablet, Take 1 tablet by mouth at bedtime., Disp: , Rfl:    nitroGLYCERIN (NITROSTAT) 0.4 MG SL tablet, Place 0.4 mg under the tongue every 5 (five) minutes as needed for chest pain. , Disp: , Rfl: 0   Nutritional Supplements (FEEDING SUPPLEMENT, NEPRO CARB STEADY,) LIQD, Take 237 mLs by mouth daily., Disp: , Rfl:    ondansetron (ZOFRAN ODT) 4 MG disintegrating tablet, Take 1 tablet (4 mg total) by mouth every 8 (eight) hours as needed for nausea or vomiting., Disp: 20 tablet, Rfl: 0   pantoprazole (PROTONIX) 40 MG tablet, Take 40 mg by mouth 2 (two) times daily., Disp: , Rfl:    sucroferric oxyhydroxide (VELPHORO) 500 MG chewable tablet, Chew 500-1,000 mg by mouth See admin instructions. Takes 2 tablets with each meal and 1 tablet with snacks., Disp: , Rfl:    UNABLE TO FIND, Out patient physical therapy- vestibular therapy for BPPV, Disp: 1 each, Rfl: 0   warfarin (COUMADIN) 7.5 MG tablet, Take 7.5 mg by mouth daily., Disp: , Rfl:    gabapentin (NEURONTIN) 300 MG capsule, Take 1 capsule (300 mg total) by mouth 3 (three) times daily., Disp: 180 capsule, Rfl: 1   oxyCODONE-acetaminophen (PERCOCET/ROXICET) 5-325 MG tablet, Take 1 tablet by mouth every 4 (four)  hours as needed for severe pain., Disp: 30 tablet, Rfl: 0   Allergies  Allergen Reactions   Ace Inhibitors Itching, Cough and Other (See Comments)     Review of Systems  Constitutional: Negative.   Respiratory: Negative.    Cardiovascular: Negative.   Gastrointestinal: Negative.   Neurological: Negative.      Today's Vitals   11/10/21 0934  BP: 106/64  Pulse: 93  Temp: 98 F (36.7 C)  TempSrc: Oral  Weight: 232 lb 9.6 oz (105.5 kg)  Height: $Remove'6\' 3"'sfTvKBI$  (1.905 m)   Wt Readings from Last 3 Encounters:  11/10/21 232 lb 9.6 oz (105.5 kg)  07/07/21 243 lb (110.2 kg)  05/05/21 243 lb (110.2 kg)   Objective:  Physical Exam Vitals reviewed.  Constitutional:      General: He is not in acute distress.    Appearance: Normal appearance.  Cardiovascular:     Rate and Rhythm: Normal rate and regular rhythm.     Pulses: Normal pulses.     Heart sounds: Normal heart sounds. No murmur heard. Pulmonary:     Effort: No respiratory distress.     Breath sounds: Normal breath sounds. No wheezing.  Musculoskeletal:        General: Tenderness (left shoulder and posterior deltoid) present. No swelling or deformity.     Comments: Range of motion is limited  Skin:    General: Skin is warm and dry.     Capillary Refill: Capillary refill takes less than 2 seconds.  Neurological:     General: No focal deficit present.     Mental Status: He is alert and oriented to person, place, and time.     Cranial Nerves: No cranial nerve deficit.     Motor: No weakness.  Psychiatric:        Mood and Affect: Mood normal.        Behavior: Behavior normal.        Thought Content: Thought content normal.        Judgment: Judgment normal.         Assessment And Plan:  1. Type 2 diabetes mellitus with end-stage renal disease ([place]) Comments: Well controlled and HgbA1c is normal range, no current medications. Diabetic foot exam done, no abnormal findings - BMP8+eGFR  2. Hypertensive heart and CKD,  ESRD on dialysis, w CHF ([place]) Comments: Blood pressure is well controlled, continue dialysis as directed by Nephrology - BMP8+eGFR  3. Mixed hyperlipidemia Comments: Cholesterol levels are stable. Continue statin  4. Wheelchair dependent Comments: Having more difficulty with ambulation. Recommended to use a wheelchair for long distances and when increased weakness  5. End-stage renal disease on hemodialysis ([place]) Comments: Continue MWF   6. Chronic pain of both shoulders Comments: Will provide pain medication until he is established with pain management. Has been followed at [facility].  - oxyCODONE-acetaminophen (PERCOCET/ROXICET) 5-325 MG tablet; Take 1 tablet by mouth every 4 (four) hours as needed for severe pain.  Dispense: 30 tablet; Refill: 0  7. Rash and nonspecific skin eruption Comments: Slightly scaly to bilateral shins, advised to exfoliate to see if improves  8. Numbness and tingling Comments: Will check metabolic cause. May be related to his previous history of uncontrolled diabetes, increased gabapentin to 300 mg three times a day - Vitamin B12 - TSH - VITAMIN D 25 Hydroxy (Vit-D Deficiency, Fractures)  9. Vitamin D deficiency - VITAMIN D 25 Hydroxy (Vit-D Deficiency, Fractures)  10. Encounter for immunization Comments: TransRx done and signed, shingrix #1 given in office - Varicella-zoster vaccine IM (Shingrix)     Patient was given opportunity to ask questions. Patient verbalized understanding of the plan and was able to repeat key elements of the plan. All questions were answered to their satisfaction.  Matthew Brine, FNP   I, Matthew Brine, FNP, have reviewed all documentation for this visit. The documentation on 11/10/21 for the exam, diagnosis, procedures, and orders are all accurate and complete.   IF YOU HAVE BEEN REFERRED TO A SPECIALIST, IT MAY TAKE 1-2 WEEKS TO SCHEDULE/PROCESS THE REFERRAL. IF YOU HAVE NOT HEARD FROM  US/SPECIALIST IN TWO WEEKS, PLEASE GIVE [place] A CALL AT [phone] X [phone].   THE PATIENT IS ENCOURAGED TO PRACTICE SOCIAL DISTANCING DUE TO THE COVID-19 PANDEMIC.

## 2021-11-11 DIAGNOSIS — N2581 Secondary hyperparathyroidism of renal origin: Secondary | ICD-10-CM | POA: Diagnosis not present

## 2021-11-11 DIAGNOSIS — N186 End stage renal disease: Secondary | ICD-10-CM | POA: Diagnosis not present

## 2021-11-11 DIAGNOSIS — D631 Anemia in chronic kidney disease: Secondary | ICD-10-CM | POA: Diagnosis not present

## 2021-11-11 DIAGNOSIS — Z992 Dependence on renal dialysis: Secondary | ICD-10-CM | POA: Diagnosis not present

## 2021-11-11 DIAGNOSIS — D509 Iron deficiency anemia, unspecified: Secondary | ICD-10-CM | POA: Diagnosis not present

## 2021-11-11 LAB — VITAMIN B12: Vitamin B-12: 911 pg/mL (ref 232–1245)

## 2021-11-11 LAB — BMP8+EGFR
BUN/Creatinine Ratio: 3 — ABNORMAL LOW (ref 9–20)
BUN: 29 mg/dL — ABNORMAL HIGH (ref 6–24)
CO2: 24 mmol/L (ref 20–29)
Calcium: 10 mg/dL (ref 8.7–10.2)
Chloride: 94 mmol/L — ABNORMAL LOW (ref 96–106)
Creatinine, Ser: 8.53 mg/dL — ABNORMAL HIGH (ref 0.76–1.27)
Glucose: 79 mg/dL (ref 70–99)
Potassium: 4.4 mmol/L (ref 3.5–5.2)
Sodium: 138 mmol/L (ref 134–144)
eGFR: 7 mL/min/{1.73_m2} — ABNORMAL LOW (ref >59)

## 2021-11-11 LAB — TSH: TSH: 1.56 u[IU]/mL (ref 0.450–4.500)

## 2021-11-11 LAB — VITAMIN D 25 HYDROXY (VIT D DEFICIENCY, FRACTURES): Vit D, 25-Hydroxy: 36.5 ng/mL (ref 30.0–100.0)

## 2021-11-13 DIAGNOSIS — N2581 Secondary hyperparathyroidism of renal origin: Secondary | ICD-10-CM | POA: Diagnosis not present

## 2021-11-13 DIAGNOSIS — Z992 Dependence on renal dialysis: Secondary | ICD-10-CM | POA: Diagnosis not present

## 2021-11-13 DIAGNOSIS — D509 Iron deficiency anemia, unspecified: Secondary | ICD-10-CM | POA: Diagnosis not present

## 2021-11-13 DIAGNOSIS — N186 End stage renal disease: Secondary | ICD-10-CM | POA: Diagnosis not present

## 2021-11-13 DIAGNOSIS — D631 Anemia in chronic kidney disease: Secondary | ICD-10-CM | POA: Diagnosis not present

## 2021-11-14 ENCOUNTER — Other Ambulatory Visit: Payer: Self-pay | Admitting: Nurse Practitioner

## 2021-11-14 DIAGNOSIS — R053 Chronic cough: Secondary | ICD-10-CM

## 2021-11-16 DIAGNOSIS — D689 Coagulation defect, unspecified: Secondary | ICD-10-CM | POA: Diagnosis not present

## 2021-11-16 DIAGNOSIS — Z992 Dependence on renal dialysis: Secondary | ICD-10-CM | POA: Diagnosis not present

## 2021-11-16 DIAGNOSIS — D631 Anemia in chronic kidney disease: Secondary | ICD-10-CM | POA: Diagnosis not present

## 2021-11-16 DIAGNOSIS — N2581 Secondary hyperparathyroidism of renal origin: Secondary | ICD-10-CM | POA: Diagnosis not present

## 2021-11-16 DIAGNOSIS — N186 End stage renal disease: Secondary | ICD-10-CM | POA: Diagnosis not present

## 2021-11-16 DIAGNOSIS — D509 Iron deficiency anemia, unspecified: Secondary | ICD-10-CM | POA: Diagnosis not present

## 2021-11-18 DIAGNOSIS — Z992 Dependence on renal dialysis: Secondary | ICD-10-CM | POA: Diagnosis not present

## 2021-11-18 DIAGNOSIS — N186 End stage renal disease: Secondary | ICD-10-CM | POA: Diagnosis not present

## 2021-11-18 DIAGNOSIS — D631 Anemia in chronic kidney disease: Secondary | ICD-10-CM | POA: Diagnosis not present

## 2021-11-18 DIAGNOSIS — D509 Iron deficiency anemia, unspecified: Secondary | ICD-10-CM | POA: Diagnosis not present

## 2021-11-18 DIAGNOSIS — N2581 Secondary hyperparathyroidism of renal origin: Secondary | ICD-10-CM | POA: Diagnosis not present

## 2021-11-20 DIAGNOSIS — D509 Iron deficiency anemia, unspecified: Secondary | ICD-10-CM | POA: Diagnosis not present

## 2021-11-20 DIAGNOSIS — N2581 Secondary hyperparathyroidism of renal origin: Secondary | ICD-10-CM | POA: Diagnosis not present

## 2021-11-20 DIAGNOSIS — Z992 Dependence on renal dialysis: Secondary | ICD-10-CM | POA: Diagnosis not present

## 2021-11-20 DIAGNOSIS — D631 Anemia in chronic kidney disease: Secondary | ICD-10-CM | POA: Diagnosis not present

## 2021-11-20 DIAGNOSIS — N186 End stage renal disease: Secondary | ICD-10-CM | POA: Diagnosis not present

## 2021-11-21 DIAGNOSIS — Z992 Dependence on renal dialysis: Secondary | ICD-10-CM | POA: Diagnosis not present

## 2021-11-21 DIAGNOSIS — N186 End stage renal disease: Secondary | ICD-10-CM | POA: Diagnosis not present

## 2021-11-21 DIAGNOSIS — I120 Hypertensive chronic kidney disease with stage 5 chronic kidney disease or end stage renal disease: Secondary | ICD-10-CM | POA: Diagnosis not present

## 2021-11-23 ENCOUNTER — Telehealth: Payer: Self-pay

## 2021-11-23 DIAGNOSIS — D509 Iron deficiency anemia, unspecified: Secondary | ICD-10-CM | POA: Diagnosis not present

## 2021-11-23 DIAGNOSIS — N186 End stage renal disease: Secondary | ICD-10-CM | POA: Diagnosis not present

## 2021-11-23 DIAGNOSIS — Z992 Dependence on renal dialysis: Secondary | ICD-10-CM | POA: Diagnosis not present

## 2021-11-23 DIAGNOSIS — N2581 Secondary hyperparathyroidism of renal origin: Secondary | ICD-10-CM | POA: Diagnosis not present

## 2021-11-23 DIAGNOSIS — D631 Anemia in chronic kidney disease: Secondary | ICD-10-CM | POA: Diagnosis not present

## 2021-11-23 NOTE — Chronic Care Management (AMB) (Signed)
11-23-2021: Unable to leave VM to reschedule 12-11-2021 appointment with [name] due to provider being out. Appointment canceled and will reach out to patient again next week.  [place] Pharmacist Assistant [phone]

## 2021-11-25 DIAGNOSIS — D631 Anemia in chronic kidney disease: Secondary | ICD-10-CM | POA: Diagnosis not present

## 2021-11-25 DIAGNOSIS — D509 Iron deficiency anemia, unspecified: Secondary | ICD-10-CM | POA: Diagnosis not present

## 2021-11-25 DIAGNOSIS — Z992 Dependence on renal dialysis: Secondary | ICD-10-CM | POA: Diagnosis not present

## 2021-11-25 DIAGNOSIS — N186 End stage renal disease: Secondary | ICD-10-CM | POA: Diagnosis not present

## 2021-11-25 DIAGNOSIS — N2581 Secondary hyperparathyroidism of renal origin: Secondary | ICD-10-CM | POA: Diagnosis not present

## 2021-11-26 DIAGNOSIS — M25561 Pain in right knee: Secondary | ICD-10-CM | POA: Diagnosis not present

## 2021-11-26 DIAGNOSIS — M170 Bilateral primary osteoarthritis of knee: Secondary | ICD-10-CM | POA: Diagnosis not present

## 2021-11-26 DIAGNOSIS — M25562 Pain in left knee: Secondary | ICD-10-CM | POA: Diagnosis not present

## 2021-11-27 DIAGNOSIS — N2581 Secondary hyperparathyroidism of renal origin: Secondary | ICD-10-CM | POA: Diagnosis not present

## 2021-11-27 DIAGNOSIS — N186 End stage renal disease: Secondary | ICD-10-CM | POA: Diagnosis not present

## 2021-11-27 DIAGNOSIS — D509 Iron deficiency anemia, unspecified: Secondary | ICD-10-CM | POA: Diagnosis not present

## 2021-11-27 DIAGNOSIS — D631 Anemia in chronic kidney disease: Secondary | ICD-10-CM | POA: Diagnosis not present

## 2021-11-27 DIAGNOSIS — Z992 Dependence on renal dialysis: Secondary | ICD-10-CM | POA: Diagnosis not present

## 2021-11-30 DIAGNOSIS — D631 Anemia in chronic kidney disease: Secondary | ICD-10-CM | POA: Diagnosis not present

## 2021-11-30 DIAGNOSIS — D689 Coagulation defect, unspecified: Secondary | ICD-10-CM | POA: Diagnosis not present

## 2021-11-30 DIAGNOSIS — N2581 Secondary hyperparathyroidism of renal origin: Secondary | ICD-10-CM | POA: Diagnosis not present

## 2021-11-30 DIAGNOSIS — Z992 Dependence on renal dialysis: Secondary | ICD-10-CM | POA: Diagnosis not present

## 2021-11-30 DIAGNOSIS — D509 Iron deficiency anemia, unspecified: Secondary | ICD-10-CM | POA: Diagnosis not present

## 2021-11-30 DIAGNOSIS — E1129 Type 2 diabetes mellitus with other diabetic kidney complication: Secondary | ICD-10-CM | POA: Diagnosis not present

## 2021-11-30 DIAGNOSIS — N186 End stage renal disease: Secondary | ICD-10-CM | POA: Diagnosis not present

## 2021-12-02 DIAGNOSIS — N186 End stage renal disease: Secondary | ICD-10-CM | POA: Diagnosis not present

## 2021-12-02 DIAGNOSIS — D509 Iron deficiency anemia, unspecified: Secondary | ICD-10-CM | POA: Diagnosis not present

## 2021-12-02 DIAGNOSIS — Z992 Dependence on renal dialysis: Secondary | ICD-10-CM | POA: Diagnosis not present

## 2021-12-02 DIAGNOSIS — D631 Anemia in chronic kidney disease: Secondary | ICD-10-CM | POA: Diagnosis not present

## 2021-12-02 DIAGNOSIS — N2581 Secondary hyperparathyroidism of renal origin: Secondary | ICD-10-CM | POA: Diagnosis not present

## 2021-12-04 DIAGNOSIS — N186 End stage renal disease: Secondary | ICD-10-CM | POA: Diagnosis not present

## 2021-12-04 DIAGNOSIS — D631 Anemia in chronic kidney disease: Secondary | ICD-10-CM | POA: Diagnosis not present

## 2021-12-04 DIAGNOSIS — D509 Iron deficiency anemia, unspecified: Secondary | ICD-10-CM | POA: Diagnosis not present

## 2021-12-04 DIAGNOSIS — Z992 Dependence on renal dialysis: Secondary | ICD-10-CM | POA: Diagnosis not present

## 2021-12-04 DIAGNOSIS — N2581 Secondary hyperparathyroidism of renal origin: Secondary | ICD-10-CM | POA: Diagnosis not present

## 2021-12-07 DIAGNOSIS — N2581 Secondary hyperparathyroidism of renal origin: Secondary | ICD-10-CM | POA: Diagnosis not present

## 2021-12-07 DIAGNOSIS — D689 Coagulation defect, unspecified: Secondary | ICD-10-CM | POA: Diagnosis not present

## 2021-12-07 DIAGNOSIS — N186 End stage renal disease: Secondary | ICD-10-CM | POA: Diagnosis not present

## 2021-12-07 DIAGNOSIS — D509 Iron deficiency anemia, unspecified: Secondary | ICD-10-CM | POA: Diagnosis not present

## 2021-12-07 DIAGNOSIS — Z992 Dependence on renal dialysis: Secondary | ICD-10-CM | POA: Diagnosis not present

## 2021-12-07 DIAGNOSIS — D631 Anemia in chronic kidney disease: Secondary | ICD-10-CM | POA: Diagnosis not present

## 2021-12-09 DIAGNOSIS — D631 Anemia in chronic kidney disease: Secondary | ICD-10-CM | POA: Diagnosis not present

## 2021-12-09 DIAGNOSIS — N186 End stage renal disease: Secondary | ICD-10-CM | POA: Diagnosis not present

## 2021-12-09 DIAGNOSIS — N2581 Secondary hyperparathyroidism of renal origin: Secondary | ICD-10-CM | POA: Diagnosis not present

## 2021-12-09 DIAGNOSIS — Z992 Dependence on renal dialysis: Secondary | ICD-10-CM | POA: Diagnosis not present

## 2021-12-09 DIAGNOSIS — D509 Iron deficiency anemia, unspecified: Secondary | ICD-10-CM | POA: Diagnosis not present

## 2021-12-11 ENCOUNTER — Telehealth: Payer: BC Managed Care – PPO

## 2021-12-11 DIAGNOSIS — D631 Anemia in chronic kidney disease: Secondary | ICD-10-CM | POA: Diagnosis not present

## 2021-12-11 DIAGNOSIS — N2581 Secondary hyperparathyroidism of renal origin: Secondary | ICD-10-CM | POA: Diagnosis not present

## 2021-12-11 DIAGNOSIS — Z992 Dependence on renal dialysis: Secondary | ICD-10-CM | POA: Diagnosis not present

## 2021-12-11 DIAGNOSIS — D509 Iron deficiency anemia, unspecified: Secondary | ICD-10-CM | POA: Diagnosis not present

## 2021-12-11 DIAGNOSIS — N186 End stage renal disease: Secondary | ICD-10-CM | POA: Diagnosis not present

## 2021-12-14 DIAGNOSIS — D509 Iron deficiency anemia, unspecified: Secondary | ICD-10-CM | POA: Diagnosis not present

## 2021-12-14 DIAGNOSIS — Z992 Dependence on renal dialysis: Secondary | ICD-10-CM | POA: Diagnosis not present

## 2021-12-14 DIAGNOSIS — N2581 Secondary hyperparathyroidism of renal origin: Secondary | ICD-10-CM | POA: Diagnosis not present

## 2021-12-14 DIAGNOSIS — D631 Anemia in chronic kidney disease: Secondary | ICD-10-CM | POA: Diagnosis not present

## 2021-12-14 DIAGNOSIS — D689 Coagulation defect, unspecified: Secondary | ICD-10-CM | POA: Diagnosis not present

## 2021-12-14 DIAGNOSIS — N186 End stage renal disease: Secondary | ICD-10-CM | POA: Diagnosis not present

## 2021-12-15 DIAGNOSIS — M25812 Other specified joint disorders, left shoulder: Secondary | ICD-10-CM | POA: Diagnosis not present

## 2021-12-15 DIAGNOSIS — M25811 Other specified joint disorders, right shoulder: Secondary | ICD-10-CM | POA: Diagnosis not present

## 2021-12-15 DIAGNOSIS — M12811 Other specific arthropathies, not elsewhere classified, right shoulder: Secondary | ICD-10-CM | POA: Insufficient documentation

## 2021-12-16 DIAGNOSIS — N186 End stage renal disease: Secondary | ICD-10-CM | POA: Diagnosis not present

## 2021-12-16 DIAGNOSIS — D509 Iron deficiency anemia, unspecified: Secondary | ICD-10-CM | POA: Diagnosis not present

## 2021-12-16 DIAGNOSIS — D631 Anemia in chronic kidney disease: Secondary | ICD-10-CM | POA: Diagnosis not present

## 2021-12-16 DIAGNOSIS — Z992 Dependence on renal dialysis: Secondary | ICD-10-CM | POA: Diagnosis not present

## 2021-12-16 DIAGNOSIS — N2581 Secondary hyperparathyroidism of renal origin: Secondary | ICD-10-CM | POA: Diagnosis not present

## 2021-12-18 DIAGNOSIS — N2581 Secondary hyperparathyroidism of renal origin: Secondary | ICD-10-CM | POA: Diagnosis not present

## 2021-12-18 DIAGNOSIS — N186 End stage renal disease: Secondary | ICD-10-CM | POA: Diagnosis not present

## 2021-12-18 DIAGNOSIS — Z992 Dependence on renal dialysis: Secondary | ICD-10-CM | POA: Diagnosis not present

## 2021-12-18 DIAGNOSIS — D631 Anemia in chronic kidney disease: Secondary | ICD-10-CM | POA: Diagnosis not present

## 2021-12-18 DIAGNOSIS — D509 Iron deficiency anemia, unspecified: Secondary | ICD-10-CM | POA: Diagnosis not present

## 2021-12-21 DIAGNOSIS — N186 End stage renal disease: Secondary | ICD-10-CM | POA: Diagnosis not present

## 2021-12-21 DIAGNOSIS — Z992 Dependence on renal dialysis: Secondary | ICD-10-CM | POA: Diagnosis not present

## 2021-12-21 DIAGNOSIS — D509 Iron deficiency anemia, unspecified: Secondary | ICD-10-CM | POA: Diagnosis not present

## 2021-12-21 DIAGNOSIS — D631 Anemia in chronic kidney disease: Secondary | ICD-10-CM | POA: Diagnosis not present

## 2021-12-21 DIAGNOSIS — N2581 Secondary hyperparathyroidism of renal origin: Secondary | ICD-10-CM | POA: Diagnosis not present

## 2021-12-22 DIAGNOSIS — I120 Hypertensive chronic kidney disease with stage 5 chronic kidney disease or end stage renal disease: Secondary | ICD-10-CM | POA: Diagnosis not present

## 2021-12-22 DIAGNOSIS — N186 End stage renal disease: Secondary | ICD-10-CM | POA: Diagnosis not present

## 2021-12-22 DIAGNOSIS — Z992 Dependence on renal dialysis: Secondary | ICD-10-CM | POA: Diagnosis not present

## 2021-12-23 DIAGNOSIS — N2581 Secondary hyperparathyroidism of renal origin: Secondary | ICD-10-CM | POA: Diagnosis not present

## 2021-12-23 DIAGNOSIS — Z992 Dependence on renal dialysis: Secondary | ICD-10-CM | POA: Diagnosis not present

## 2021-12-23 DIAGNOSIS — D509 Iron deficiency anemia, unspecified: Secondary | ICD-10-CM | POA: Diagnosis not present

## 2021-12-23 DIAGNOSIS — N186 End stage renal disease: Secondary | ICD-10-CM | POA: Diagnosis not present

## 2021-12-23 DIAGNOSIS — D631 Anemia in chronic kidney disease: Secondary | ICD-10-CM | POA: Diagnosis not present

## 2021-12-25 DIAGNOSIS — N2581 Secondary hyperparathyroidism of renal origin: Secondary | ICD-10-CM | POA: Diagnosis not present

## 2021-12-25 DIAGNOSIS — D631 Anemia in chronic kidney disease: Secondary | ICD-10-CM | POA: Diagnosis not present

## 2021-12-25 DIAGNOSIS — D509 Iron deficiency anemia, unspecified: Secondary | ICD-10-CM | POA: Diagnosis not present

## 2021-12-25 DIAGNOSIS — Z992 Dependence on renal dialysis: Secondary | ICD-10-CM | POA: Diagnosis not present

## 2021-12-25 DIAGNOSIS — N186 End stage renal disease: Secondary | ICD-10-CM | POA: Diagnosis not present

## 2021-12-28 DIAGNOSIS — N2581 Secondary hyperparathyroidism of renal origin: Secondary | ICD-10-CM | POA: Diagnosis not present

## 2021-12-28 DIAGNOSIS — N186 End stage renal disease: Secondary | ICD-10-CM | POA: Diagnosis not present

## 2021-12-28 DIAGNOSIS — D509 Iron deficiency anemia, unspecified: Secondary | ICD-10-CM | POA: Diagnosis not present

## 2021-12-28 DIAGNOSIS — D631 Anemia in chronic kidney disease: Secondary | ICD-10-CM | POA: Diagnosis not present

## 2021-12-28 DIAGNOSIS — Z992 Dependence on renal dialysis: Secondary | ICD-10-CM | POA: Diagnosis not present

## 2021-12-28 DIAGNOSIS — D689 Coagulation defect, unspecified: Secondary | ICD-10-CM | POA: Diagnosis not present

## 2021-12-30 DIAGNOSIS — D631 Anemia in chronic kidney disease: Secondary | ICD-10-CM | POA: Diagnosis not present

## 2021-12-30 DIAGNOSIS — D509 Iron deficiency anemia, unspecified: Secondary | ICD-10-CM | POA: Diagnosis not present

## 2021-12-30 DIAGNOSIS — N2581 Secondary hyperparathyroidism of renal origin: Secondary | ICD-10-CM | POA: Diagnosis not present

## 2021-12-30 DIAGNOSIS — N186 End stage renal disease: Secondary | ICD-10-CM | POA: Diagnosis not present

## 2021-12-30 DIAGNOSIS — Z992 Dependence on renal dialysis: Secondary | ICD-10-CM | POA: Diagnosis not present

## 2022-01-01 DIAGNOSIS — N2581 Secondary hyperparathyroidism of renal origin: Secondary | ICD-10-CM | POA: Diagnosis not present

## 2022-01-01 DIAGNOSIS — N186 End stage renal disease: Secondary | ICD-10-CM | POA: Diagnosis not present

## 2022-01-01 DIAGNOSIS — D631 Anemia in chronic kidney disease: Secondary | ICD-10-CM | POA: Diagnosis not present

## 2022-01-01 DIAGNOSIS — D509 Iron deficiency anemia, unspecified: Secondary | ICD-10-CM | POA: Diagnosis not present

## 2022-01-01 DIAGNOSIS — Z992 Dependence on renal dialysis: Secondary | ICD-10-CM | POA: Diagnosis not present

## 2022-01-04 DIAGNOSIS — D509 Iron deficiency anemia, unspecified: Secondary | ICD-10-CM | POA: Diagnosis not present

## 2022-01-04 DIAGNOSIS — N186 End stage renal disease: Secondary | ICD-10-CM | POA: Diagnosis not present

## 2022-01-04 DIAGNOSIS — N2581 Secondary hyperparathyroidism of renal origin: Secondary | ICD-10-CM | POA: Diagnosis not present

## 2022-01-04 DIAGNOSIS — D631 Anemia in chronic kidney disease: Secondary | ICD-10-CM | POA: Diagnosis not present

## 2022-01-04 DIAGNOSIS — Z992 Dependence on renal dialysis: Secondary | ICD-10-CM | POA: Diagnosis not present

## 2022-01-06 DIAGNOSIS — Z992 Dependence on renal dialysis: Secondary | ICD-10-CM | POA: Diagnosis not present

## 2022-01-06 DIAGNOSIS — N186 End stage renal disease: Secondary | ICD-10-CM | POA: Diagnosis not present

## 2022-01-06 DIAGNOSIS — D509 Iron deficiency anemia, unspecified: Secondary | ICD-10-CM | POA: Diagnosis not present

## 2022-01-06 DIAGNOSIS — D631 Anemia in chronic kidney disease: Secondary | ICD-10-CM | POA: Diagnosis not present

## 2022-01-06 DIAGNOSIS — N2581 Secondary hyperparathyroidism of renal origin: Secondary | ICD-10-CM | POA: Diagnosis not present

## 2022-01-07 ENCOUNTER — Ambulatory Visit (INDEPENDENT_AMBULATORY_CARE_PROVIDER_SITE_OTHER): Payer: Medicare Other

## 2022-01-07 VITALS — BP 110/62 | HR 95 | Temp 97.8°F | Ht 68.0 in | Wt 240.6 lb

## 2022-01-07 DIAGNOSIS — Z0000 Encounter for general adult medical examination without abnormal findings: Secondary | ICD-10-CM | POA: Diagnosis not present

## 2022-01-07 NOTE — Progress Notes (Signed)
Subjective:   [name] is a 61 y.o. male who presents for Medicare Annual/Subsequent preventive examination.  Review of Systems     Cardiac Risk Factors include: advanced age (>65mn, >>34women);diabetes mellitus;hypertension;obesity (BMI >30kg/m2)     Objective:    Today's Vitals   01/07/22 1105 01/07/22 1111  BP: 110/62   Pulse: 95   Temp: 97.8 F (36.6 C)   TempSrc: Oral   SpO2: 99%   Weight: 240 lb 9.6 oz (109.1 kg)   Height: '5\' 8"'$  (1.727 m)   PainSc:  10-Worst pain ever   Body mass index is 36.58 kg/m.     01/07/2022   11:21 AM 12/25/2020    9:29 AM 06/05/2020    8:33 AM 04/24/2020   11:23 PM 11/15/2019    9:17 AM 03/15/2019    8:49 AM 01/18/2019    8:48 PM  Advanced Directives  Does Patient Have a Medical Advance Directive? No No No No No No   Would patient like information on creating a medical advance directive? No - Patient declined No - Patient declined No - Patient declined No - Patient declined No - Patient declined No - Patient declined No - Patient declined    Current Medications (verified) Outpatient Encounter Medications as of 01/07/2022  Medication Sig   albuterol (VENTOLIN HFA) 108 (90 Base) MCG/ACT inhaler Inhale 2 puffs into the lungs every 6 (six) hours as needed for wheezing or shortness of breath.   calcium acetate (PHOSLO) 667 MG capsule Take 1-3 capsules (667-2,001 mg total) by mouth See admin instructions. Take 2001 mg capsules with meals three times daily and 667 mg capsule with snacks. (Patient taking differently: Take 2,001 mg by mouth daily.)   chlorhexidine (PERIDEX) 0.12 % solution 15 mLs by Mouth Rinse route 2 (two) times daily.   cinacalcet (SENSIPAR) 90 MG tablet Take 180 mg by mouth daily.   diclofenac Sodium (VOLTAREN) 1 % GEL Apply 2 g topically 4 (four) times daily.   digoxin (LANOXIN) 0.125 MG tablet Take 1 tablet (0.125 mg total) by mouth every Monday, Wednesday, and Friday.   ferric citrate (AURYXIA) 1 GM 210 MG(Fe) tablet  Take 630 mg by mouth 3 (three) times daily with meals.   fluticasone furoate-vilanterol (BREO ELLIPTA) 100-25 MCG/ACT AEPB Inhale 1 puff into the lungs daily.   gabapentin (NEURONTIN) 300 MG capsule Take 1 capsule (300 mg total) by mouth 3 (three) times daily.   glucose blood test strip Use to check blood sugar 3 times a day. Dx code e11.65   ipratropium (ATROVENT) 0.06 % nasal spray Place 2 sprays into both nostrils 4 (four) times daily. (Patient taking differently: Place 2 sprays into both nostrils 4 (four) times daily as needed for rhinitis.)   isosorbide dinitrate (ISORDIL) 10 MG tablet Take 10 mg by mouth 2 (two) times daily.   Lactobacillus (ACIDOPHILUS/BIFIDUS PO) Take 1 capsule by mouth daily. Written by Dr. MCollene Maresto take once per day.   Lancets (ONETOUCH DELICA PLUS [id number] MISC Use to check blood sugar 3 times a day. Dx code e11.65   lidocaine-prilocaine (EMLA) cream Apply 1 application topically See admin instructions. Apply topically to port access prior to dialysis - Monday, Wednesday, Friday.   loperamide (IMODIUM A-D) 2 MG tablet Take 1 tablet (2 mg total) by mouth 4 (four) times daily as needed for diarrhea or loose stools. Also available OTC.   meclizine (ANTIVERT) 25 MG tablet Take 1 tablet (25 mg total) by mouth 3 (three) times  daily as needed for dizziness.   midodrine (PROAMATINE) 10 MG tablet Take 1 tablet (10 mg total) by mouth 3 (three) times daily.   multivitamin (RENA-VIT) TABS tablet Take 1 tablet by mouth at bedtime.   nitroGLYCERIN (NITROSTAT) 0.4 MG SL tablet Place 0.4 mg under the tongue every 5 (five) minutes as needed for chest pain.    Nutritional Supplements (FEEDING SUPPLEMENT, NEPRO CARB STEADY,) LIQD Take 237 mLs by mouth daily.   ondansetron (ZOFRAN ODT) 4 MG disintegrating tablet Take 1 tablet (4 mg total) by mouth every 8 (eight) hours as needed for nausea or vomiting.   oxyCODONE-acetaminophen (PERCOCET/ROXICET) 5-325 MG tablet Take 1 tablet by mouth every  4 (four) hours as needed for severe pain.   pantoprazole (PROTONIX) 40 MG tablet Take 40 mg by mouth 2 (two) times daily.   sucroferric oxyhydroxide (VELPHORO) 500 MG chewable tablet Chew 500-1,000 mg by mouth See admin instructions. Takes 2 tablets with each meal and 1 tablet with snacks.   UNABLE TO FIND Out patient physical therapy- vestibular therapy for BPPV   warfarin (COUMADIN) 7.5 MG tablet Take 7.5 mg by mouth daily.   Control Gel Formula Dressing (DUODERM CGF EXTRA THIN) MISC Apply 1 each topically daily as needed. (Patient not taking: Reported on 01/07/2022)   HYDROcodone bit-homatropine (HYDROMET) 5-1.5 MG/5ML syrup Take 5 mLs by mouth every 6 (six) hours as needed for cough. (Patient not taking: Reported on 01/07/2022)   metoprolol succinate (TOPROL-XL) 25 MG 24 hr tablet Take 1 tablet (25 mg total) by mouth daily. (Patient not taking: Reported on 01/07/2022)   metoprolol tartrate (LOPRESSOR) 25 MG tablet Take 25 mg by mouth 2 (two) times daily. (Patient not taking: Reported on 01/07/2022)   No facility-administered encounter medications on file as of 01/07/2022.    Allergies (verified) Ace inhibitors   History: Past Medical History:  Diagnosis Date   Acute lower GI bleeding 06/21/2018   Acute respiratory failure with hypoxia (HCC) 01/13/2015   Atrial flutter (HCC)    Bile leak, postoperative 03/15/2016   Biliary dyskinesia 02/12/2016   Blind right eye    Hx: of partial blindness in right eye   Cardiomyopathy    CHF (congestive heart failure) ([place])    Coronary artery disease    normal coronaries by 10/10/08 cath, Cardiac Cath 08-04-12 epic.Dr. Doylene Canard follows   Diabetes mellitus    pt. states he's borderline diabetic., no longer taking med,- off med. since 2013   Dialysis patient [facility])    Mon-Wed-Fri([place])- Left AV fistula   Diverticulitis November 2016   reoccurred in December 2016   Diverticulitis of intestine without perforation or abscess without bleeding  04/21/2015   DVT (deep venous thrombosis) (HCC)    ESRD (end stage renal disease) ([place])    GERD (gastroesophageal reflux disease)    Gout    Hemorrhage of left kidney 01/13/2018   History of nephrectomy 07/04/2012   History of right nephrectomy in 2002 for renal cell carcinoma    History of unilateral nephrectomy    Hypertension    Low iron    Myocardial infarction [facility]) ?2006   Renal cell carcinoma    dialysis- [facility]- [address]- Dr. Mercy Moore follows.   Renal insufficiency    Shortness of breath 05/19/11   "at rest, lying down, w/exertion"   Stroke [facility]) 02/2011   05/19/11 denies residual   Umbilical hernia 75/64/33   unrepaired   Wears glasses    Past Surgical History:  Procedure Laterality Date  ANAL FISTULOTOMY  08/15/2018   AV FISTULA PLACEMENT  03/29/2011   Procedure: ARTERIOVENOUS (AV) FISTULA CREATION;  Surgeon: Hinda Lenis, MD;  Location: [place];  Service: Vascular;  Laterality: Left;  LEFT RADIOCEPHALIC , Arteriovenous [id number])   CARDIAC CATHETERIZATION  05/21/11   CARDIAC CATHETERIZATION N/A 03/16/2016   Procedure: Left Heart Cath and Coronary Angiography;  Surgeon: Dixie Dials, MD;  Location: [place] CV LAB;  Service: Cardiovascular;  Laterality: N/A;   CHOLECYSTECTOMY N/A 02/12/2016   Procedure: LAPAROSCOPIC CHOLECYSTECTOMY WITH INTRAOPERATIVE CHOLANGIOGRAM;  Surgeon: Jackolyn Confer, MD;  Location: [place];  Service: General;  Laterality: N/A;   COLONOSCOPY WITH PROPOFOL N/A 02/01/2017   Procedure: COLONOSCOPY WITH PROPOFOL;  Surgeon: Juanita Craver, MD;  Location: [facility];  Service: Endoscopy;  Laterality: N/A;   dialysis cath placed     ESOPHAGOGASTRODUODENOSCOPY (EGD) WITH PROPOFOL N/A 12/02/2015   Procedure: ESOPHAGOGASTRODUODENOSCOPY (EGD) WITH PROPOFOL;  Surgeon: Juanita Craver, MD;  Location: [facility];  Service: Endoscopy;  Laterality: N/A;   EVALUATION UNDER ANESTHESIA WITH HEMORRHOIDECTOMY N/A 08/15/2018   Procedure: HEMORRHOID  LIGATION/PEXY;  Surgeon: Michael Boston, MD;  Location: [place];  Service: General;  Laterality: N/A;   FINGER SURGERY     L pinkie finger- ORIF- /w remaining hardware - 1990's     FISTULOTOMY N/A 08/15/2018   Procedure: SUPERFICIAL ANAL FISTULOTOMY;  Surgeon: Michael Boston, MD;  Location: [place];  Service: General;  Laterality: N/A;   HEMORRHOID SURGERY     HERNIA REPAIR N/A 3/00/9233   Umbilical hernia repair   INSERTION OF DIALYSIS CATHETER N/A 01/27/2016   Procedure: INSERTION OF DIALYSIS CATHETER;  Surgeon: Waynetta Sandy, MD;  Location: [place];  Service: Vascular;  Laterality: N/A;   INSERTION OF MESH N/A 07/04/2012   Procedure: INSERTION OF MESH;  Surgeon: Madilyn Hook, DO;  Location: [place];  Service: General;  Laterality: N/A;   IR EMBO ART  VEN HEMORR LYMPH EXTRAV  INC GUIDE ROADMAPPING  01/13/2018   IR FLUORO GUIDE CV LINE RIGHT  01/13/2018   IR IVC FILTER PLMT / S&I /IMG GUID/MOD SED  01/27/2018   IR RENAL SELECTIVE  UNI INC S&I MOD SED  01/13/2018   IR US GUIDE VASC ACCESS RIGHT  01/13/2018   KIDNEY CYST REMOVAL  2019   LAPAROSCOPIC LYSIS OF ADHESIONS N/A 02/12/2016   Procedure: LAPAROSCOPIC LYSIS OF ADHESIONS;  Surgeon: Jackolyn Confer, MD;  Location: [place];  Service: General;  Laterality: N/A;   LEFT AND RIGHT HEART CATHETERIZATION WITH CORONARY ANGIOGRAM N/A 08/04/2012   Procedure: LEFT AND RIGHT HEART CATHETERIZATION WITH CORONARY ANGIOGRAM;  Surgeon: Birdie Riddle, MD;  Location: [place] CATH LAB;  Service: Cardiovascular;  Laterality: N/A;   NEPHRECTOMY  2000   right   REVISON OF ARTERIOVENOUS FISTULA Left 06/05/2013   Procedure: REVISON OF LEFT RADIOCEPHALIC  ARTERIOVENOUS FISTULA;  Surgeon: Conrad Weleetka, MD;  Location: [place];  Service: Vascular;  Laterality: Left;   REVISON OF ARTERIOVENOUS FISTULA Left 01/27/2016   Procedure: REVISION OF LEFT UPPER EXTREMITY ARTERIOVENOUS FISTULA;  Surgeon: Waynetta Sandy, MD;  Location: [place];  Service: Vascular;  Laterality: Left;    REVISON OF ARTERIOVENOUS FISTULA Left 08/03/2016   Procedure: REVISON OF Left arm ARTERIOVENOUS FISTULA;  Surgeon: Elam Dutch, MD;  Location: [place];  Service: Vascular;  Laterality: Left;   REVISON OF ARTERIOVENOUS FISTULA Left 05/06/2017   Procedure: REVISION PLICATION OF ARTERIOVENOUS FISTULA LEFT ARM;  Surgeon: Rosetta Posner, MD;  Location: [place];  Service: Vascular;  Laterality: Left;   REVISON OF ARTERIOVENOUS FISTULA Left 02/01/2019   Procedure: REVISION OF ARTERIOVENOUS FISTULA LEFT ARM;  Surgeon: [name], MD;  Location: [place];  Service: Vascular;  Laterality: Left;   REVISON OF ARTERIOVENOUS FISTULA Left 03/15/2019   Procedure: REVISION PLICATION OF ARTERIOVENOUS FISTULA LEFT ARM;  Surgeon: [name], MD;  Location: [place];  Service: Vascular;  Laterality: Left;   RIGHT HEART CATHETERIZATION N/A 05/21/2011   Procedure: RIGHT HEART CATH;  Surgeon: Birdie Riddle, MD;  Location: [place] CATH LAB;  Service: Cardiovascular;  Laterality: N/A;   smashed  1990's   "left pinky; have a plate in there"   UMBILICAL HERNIA REPAIR N/A 07/04/2012   Procedure: LAPAROSCOPIC UMBILICAL HERNIA;  Surgeon: Madilyn Hook, DO;  Location: [place];  Service: General;  Laterality: N/A;   US ECHOCARDIOGRAPHY  05/20/11   Family History  Problem Relation Age of Onset   Hypertension Mother    Kidney disease Mother    Social History   Socioeconomic History   Marital status: Married    Spouse name: Not on file   Number of children: Not on file   Years of education: Not on file   Highest education level: Not on file  Occupational History   Occupation: disability  Tobacco Use   Smoking status: Never   Smokeless tobacco: Never  Vaping Use   Vaping Use: Never used  Substance and Sexual Activity   Alcohol use: No    Alcohol/week: 0.0 standard drinks of alcohol   Drug use: Yes    Types: Oxycodone    Comment: Past hx-none in 20 yrs   Sexual activity: Yes  Other Topics Concern   Not on  file  Social History Narrative   Not on file   Social Determinants of Health   Financial Resource Strain: Low Risk  (01/07/2022)   Overall Financial Resource Strain (CARDIA)    Difficulty of Paying Living Expenses: Not hard at all  Food Insecurity: No Food Insecurity (01/07/2022)   Hunger Vital Sign    Worried About Running Out of Food in the Last Year: Never true    [place] in the Last Year: Never true  Transportation Needs: No Transportation Needs (01/07/2022)   PRAPARE - Hydrologist (Medical): No    Lack of Transportation (Non-Medical): No  Physical Activity: Inactive (01/07/2022)   Exercise Vital Sign    Days of Exercise per Week: 0 days    Minutes of Exercise per Session: 0 min  Stress: No Stress Concern Present (01/07/2022)   [place]    Feeling of Stress : Not at all  Social Connections: Not on file    Tobacco Counseling Counseling given: Not Answered   Clinical Intake:  Pre-visit preparation completed: Yes  Pain : 0-10 Pain Score: 10-Worst pain ever Pain Type: Chronic pain Pain Location: Shoulder (knees) Pain Orientation: Left, Right Pain Descriptors / Indicators: Throbbing Pain Onset: More than a month ago Pain Frequency: Constant     Nutritional Status: BMI > 30  Obese Nutritional Risks: Nausea/ vomitting/ diarrhea (diarrhea on dialysis) Diabetes: Yes  How often do you need to have someone help you when you read instructions, pamphlets, or other written materials from your doctor or pharmacy?: 1 - Never  Diabetic? Yes Nutrition Risk Assessment:  Has the patient had any N/V/D within the last 2 months?  Yes  Does the patient  have any non-healing wounds?  No  Has the patient had any unintentional weight loss or weight gain?   Goes up and down  Diabetes:  Is the patient diabetic?  Yes  If diabetic, was a CBG obtained today?  No  Did the patient bring  in their glucometer from home?  No  How often do you monitor your CBG's? Twice weekly.   Financial Strains and Diabetes Management:  Are you having any financial strains with the device, your supplies or your medication? No .  Does the patient want to be seen by Chronic Care Management for management of their diabetes?  No  Would the patient like to be referred to a Nutritionist or for Diabetic Management?  No   Diabetic Exams:  Diabetic Eye Exam: Overdue for diabetic eye exam. Pt has been advised about the importance in completing this exam. Patient advised to call and schedule an eye exam. Diabetic Foot Exam: Completed 11/10/2021   Interpreter Needed?: No  Information entered by :: NAllen LPN   Activities of Daily Living    01/07/2022   11:23 AM  In your present state of health, do you have any difficulty performing the following activities:  Hearing? 0  Vision? 1  Comment blind right eye  Difficulty concentrating or making decisions? 1  Walking or climbing stairs? 1  Dressing or bathing? 1  Doing errands, shopping? 1  Preparing Food and eating ? Y  Using the Toilet? N  In the past six months, have you accidently leaked urine? N  Do you have problems with loss of bowel control? N  Managing your Medications? Y  Managing your Finances? N  Housekeeping or managing your Housekeeping? Y    Patient Care Team: [name], FNP as PCP - General (General Practice) Michael Boston, MD as Consulting Physician (General Surgery) Juanita Craver, MD as Consulting Physician (Gastroenterology) Dixie Dials, MD as Consulting Physician (Cardiology) Donato Heinz, MD as Consulting Physician (Nephrology) Mayford Knife, [facility] (Pharmacist)  Indicate any recent Medical Services you may have received from other than [facility] providers in the past year (date may be approximate).     Assessment:   This is a routine wellness examination for [place].  Hearing/Vision screen Vision Screening  - Comments:: Regular eye exams, [facility]  Dietary issues and exercise activities discussed: Current Exercise Habits: The patient does not participate in regular exercise at present   Goals Addressed             This Visit's Progress    Patient Stated       01/07/2022, wants to get better with knees and shoulders       Depression Screen    01/07/2022   11:23 AM 12/25/2020    9:30 AM 11/27/2020    8:41 AM 11/15/2019    9:18 AM 05/31/2019    2:17 PM 01/18/2019   11:09 AM 11/09/2018    8:53 AM  PHQ 2/9 Scores  PHQ - 2 Score 0 0 0 0 0 0 0  PHQ- 9 Score       1    Fall Risk    01/07/2022   11:22 AM 12/25/2020    9:29 AM 11/15/2019    9:17 AM 05/31/2019    2:17 PM 01/18/2019   11:08 AM  Fall Risk   Falls in the past year? 1 0 0 0 0  Comment legs gave out      Number falls in past yr: 1  Injury with Fall? 0      Risk for fall due to : Impaired balance/gait;Medication side effect Medication side effect;Impaired vision Medication side effect    Follow up Falls evaluation completed;Education provided;Falls prevention discussed Falls evaluation completed;Education provided;Falls prevention discussed Falls evaluation completed;Education provided;Falls prevention discussed      FALL RISK PREVENTION PERTAINING TO THE HOME:  Any stairs in or around the home? No  If so, are there any without handrails?  ramp Home free of loose throw rugs in walkways, pet beds, electrical cords, etc? Yes  Adequate lighting in your home to reduce risk of falls? Yes   ASSISTIVE DEVICES UTILIZED TO PREVENT FALLS:  Life alert? No  Use of a cane, walker or w/c? Yes  Grab bars in the bathroom? No  Shower chair or bench in shower? No  Elevated toilet seat or a handicapped toilet? Yes   TIMED UP AND GO:  Was the test performed? No .    Gait slow and steady with assistive device  Cognitive Function:        01/07/2022   11:27 AM 12/25/2020    9:31 AM 11/15/2019    9:19 AM 11/09/2018    8:56 AM   6CIT Screen  What Year? 0 points 0 points 0 points 0 points  What month? 0 points 0 points 0 points 0 points  What time? 0 points 0 points 0 points 0 points  Count back from 20 0 points 2 points 0 points 0 points  Months in reverse 0 points 4 points 4 points 4 points  Repeat phrase 10 points 4 points 4 points 0 points  Total Score 10 points 10 points 8 points 4 points    Immunizations Immunization History  Administered Date(s) Administered   PFIZER(Purple Top)SARS-COV-2 Vaccination 08/03/2019, 08/29/2019, 02/22/2020   Pfizer Covid-19 Vaccine Bivalent Booster 77yr & up 04/03/2021   Zoster Recombinat (Shingrix) 11/10/2021    TDAP status: Up to date  Flu Vaccine status: Declined, Education has been provided regarding the importance of this vaccine but patient still declined. Advised may receive this vaccine at local pharmacy or Health Dept. Aware to provide a copy of the vaccination record if obtained from local pharmacy or Health Dept. Verbalized acceptance and understanding.  Pneumococcal vaccine status: Up to date  Covid-19 vaccine status: Completed vaccines  Qualifies for Shingles Vaccine? Yes   Zostavax completed No   Shingrix Completed?: needs second dose  Screening Tests Health Maintenance  Topic Date Due   OPHTHALMOLOGY EXAM  Never done   COVID-19 Vaccine (5 - Pfizer risk series) 05/29/2021   Zoster Vaccines- Shingrix (2 of 2) 01/05/2022   INFLUENZA VACCINE  12/22/2021   HEMOGLOBIN A1C  01/04/2022   FOOT EXAM  11/11/2022   TETANUS/TDAP  08/11/2026   COLONOSCOPY (Pts 45-420yrInsurance coverage will need to be confirmed)  02/02/2027   Hepatitis C Screening  Completed   HIV Screening  Completed   HPV VACCINES  Aged Out   URINE MICROALBUMIN  Discontinued    Health Maintenance  Health Maintenance Due  Topic Date Due   OPHTHALMOLOGY EXAM  Never done   COVID-19 Vaccine (5 - Pfizer risk series) 05/29/2021   Zoster Vaccines- Shingrix (2 of 2) 01/05/2022    INFLUENZA VACCINE  12/22/2021   HEMOGLOBIN A1C  01/04/2022    Colorectal cancer screening: Type of screening: Colonoscopy. Completed 02/01/2017. Repeat every 10 years  Lung Cancer Screening: (Low Dose CT Chest recommended if Age 59-80ears, 30 pack-year currently smoking OR  have quit w/in 15years.) does not qualify.   Lung Cancer Screening Referral: no  Additional Screening:  Hepatitis C Screening: does qualify; Completed 07/18/2018  Vision Screening: Recommended annual ophthalmology exams for early detection of glaucoma and other disorders of the eye. Is the patient up to date with their annual eye exam?  No  Who is the provider or what is the name of the office in which the patient attends annual eye exams? [facility] If pt is not established with a provider, would they like to be referred to a provider to establish care? No .   Dental Screening: Recommended annual dental exams for proper oral hygiene  Community Resource Referral / Chronic Care Management: CRR required this visit?  No   CCM required this visit?  No      Plan:     I have personally reviewed and noted the following in the patient's chart:   Medical and social history Use of alcohol, tobacco or illicit drugs  Current medications and supplements including opioid prescriptions. Patient is currently taking opioid prescriptions. Information provided to patient regarding non-opioid alternatives. Patient advised to discuss non-opioid treatment plan with their provider. Functional ability and status Nutritional status Physical activity Advanced directives List of other physicians Hospitalizations, surgeries, and ER visits in previous 12 months Vitals Screenings to include cognitive, depression, and falls Referrals and appointments  In addition, I have reviewed and discussed with patient certain preventive protocols, quality metrics, and best practice recommendations. A written personalized care plan for  preventive services as well as general preventive health recommendations were provided to patient.     Kellie Simmering, LPN   [id number]/56/[id number]   Nurse Notes: Wants to know if there is anything better than immodium for diarrhea that he can use. He has constant diarrhea daily. Message sent to PCP inquiring.

## 2022-01-07 NOTE — Patient Instructions (Signed)
[name] , Thank you for taking time to come for your Medicare Wellness Visit. I appreciate your ongoing commitment to your health goals. Please review the following plan we discussed and let me know if I can assist you in the future.   Screening recommendations/referrals: Colonoscopy: completed 02/01/2017, due 02/02/2027 Recommended yearly ophthalmology/optometry visit for glaucoma screening and checkup Recommended yearly dental visit for hygiene and checkup  Vaccinations: Influenza vaccine: decline Pneumococcal vaccine: n/a Tdap vaccine: completed 08/10/2016, due 08/11/2026 Shingles vaccine: needs second dose   Covid-19:  06/03/2020, 02/22/2020, 08/29/2019, 08/03/2019  Advanced directives: Advance directive discussed with you today. Even though you declined this today please call our office should you change your mind and we can give you the proper paperwork for you to fill out.  Conditions/risks identified: none  Next appointment: Follow up in one year for your annual wellness visit   Preventive Care 40-64 Years, Male Preventive care refers to lifestyle choices and visits with your health care provider that can promote health and wellness. What does preventive care include? A yearly physical exam. This is also called an annual well check. Dental exams once or twice a year. Routine eye exams. Ask your health care provider how often you should have your eyes checked. Personal lifestyle choices, including: Daily care of your teeth and gums. Regular physical activity. Eating a healthy diet. Avoiding tobacco and drug use. Limiting alcohol use. Practicing safe sex. Taking low-dose aspirin every day starting at age 33. What happens during an annual well check? The services and screenings done by your health care provider during your annual well check will depend on your age, overall health, lifestyle risk factors, and family history of disease. Counseling  Your health care provider may ask  you questions about your: Alcohol use. Tobacco use. Drug use. Emotional well-being. Home and relationship well-being. Sexual activity. Eating habits. Work and work Statistician. Screening  You may have the following tests or measurements: Height, weight, and BMI. Blood pressure. Lipid and cholesterol levels. These may be checked every 5 years, or more frequently if you are over 9 years old. Skin check. Lung cancer screening. You may have this screening every year starting at age 22 if you have a 30-pack-year history of smoking and currently smoke or have quit within the past 15 years. Fecal occult blood test (FOBT) of the stool. You may have this test every year starting at age 83. Flexible sigmoidoscopy or colonoscopy. You may have a sigmoidoscopy every 5 years or a colonoscopy every 10 years starting at age 44. Prostate cancer screening. Recommendations will vary depending on your family history and other risks. Hepatitis C blood test. Hepatitis B blood test. Sexually transmitted disease (STD) testing. Diabetes screening. This is done by checking your blood sugar (glucose) after you have not eaten for a while (fasting). You may have this done every 1-3 years. Discuss your test results, treatment options, and if necessary, the need for more tests with your health care provider. Vaccines  Your health care provider may recommend certain vaccines, such as: Influenza vaccine. This is recommended every year. Tetanus, diphtheria, and acellular pertussis (Tdap, Td) vaccine. You may need a Td booster every 10 years. Zoster vaccine. You may need this after age 22. Pneumococcal 13-valent conjugate (PCV13) vaccine. You may need this if you have certain conditions and have not been vaccinated. Pneumococcal polysaccharide (PPSV23) vaccine. You may need one or two doses if you smoke cigarettes or if you have certain conditions. Talk to your health  care provider about which screenings and vaccines  you need and how often you need them. This information is not intended to replace advice given to you by your health care provider. Make sure you discuss any questions you have with your health care provider. Document Released: 06/06/2015 Document Revised: 01/28/2016 Document Reviewed: 03/11/2015 Elsevier Interactive Patient Education  2017 [employer] Prevention in the Home Falls can cause injuries. They can happen to people of all ages. There are many things you can do to make your home safe and to help prevent falls. What can I do on the outside of my home? Regularly fix the edges of walkways and driveways and fix any cracks. Remove anything that might make you trip as you walk through a door, such as a raised step or threshold. Trim any bushes or trees on the path to your home. Use bright outdoor lighting. Clear any walking paths of anything that might make someone trip, such as rocks or tools. Regularly check to see if handrails are loose or broken. Make sure that both sides of any steps have handrails. Any raised decks and porches should have guardrails on the edges. Have any leaves, snow, or ice cleared regularly. Use sand or salt on walking paths during winter. Clean up any spills in your garage right away. This includes oil or grease spills. What can I do in the bathroom? Use night lights. Install grab bars by the toilet and in the tub and shower. Do not use towel bars as grab bars. Use non-skid mats or decals in the tub or shower. If you need to sit down in the shower, use a plastic, non-slip stool. Keep the floor dry. Clean up any water that spills on the floor as soon as it happens. Remove soap buildup in the tub or shower regularly. Attach bath mats securely with double-sided non-slip rug tape. Do not have throw rugs and other things on the floor that can make you trip. What can I do in the bedroom? Use night lights. Make sure that you have a light by your bed that  is easy to reach. Do not use any sheets or blankets that are too big for your bed. They should not hang down onto the floor. Have a firm chair that has side arms. You can use this for support while you get dressed. Do not have throw rugs and other things on the floor that can make you trip. What can I do in the kitchen? Clean up any spills right away. Avoid walking on wet floors. Keep items that you use a lot in easy-to-reach places. If you need to reach something above you, use a strong step stool that has a grab bar. Keep electrical cords out of the way. Do not use floor polish or wax that makes floors slippery. If you must use wax, use non-skid floor wax. Do not have throw rugs and other things on the floor that can make you trip. What can I do with my stairs? Do not leave any items on the stairs. Make sure that there are handrails on both sides of the stairs and use them. Fix handrails that are broken or loose. Make sure that handrails are as long as the stairways. Check any carpeting to make sure that it is firmly attached to the stairs. Fix any carpet that is loose or worn. Avoid having throw rugs at the top or bottom of the stairs. If you do have throw rugs, attach them to  the floor with carpet tape. Make sure that you have a light switch at the top of the stairs and the bottom of the stairs. If you do not have them, ask someone to add them for you. What else can I do to help prevent falls? Wear shoes that: Do not have high heels. Have rubber bottoms. Are comfortable and fit you well. Are closed at the toe. Do not wear sandals. If you use a stepladder: Make sure that it is fully opened. Do not climb a closed stepladder. Make sure that both sides of the stepladder are locked into place. Ask someone to hold it for you, if possible. Clearly mark and make sure that you can see: Any grab bars or handrails. First and last steps. Where the edge of each step is. Use tools that help you  move around (mobility aids) if they are needed. These include: Canes. Walkers. Scooters. Crutches. Turn on the lights when you go into a dark area. Replace any light bulbs as soon as they burn out. Set up your furniture so you have a clear path. Avoid moving your furniture around. If any of your floors are uneven, fix them. If there are any pets around you, be aware of where they are. Review your medicines with your doctor. Some medicines can make you feel dizzy. This can increase your chance of falling. Ask your doctor what other things that you can do to help prevent falls. This information is not intended to replace advice given to you by your health care provider. Make sure you discuss any questions you have with your health care provider. Document Released: 03/06/2009 Document Revised: 10/16/2015 Document Reviewed: 06/14/2014 Elsevier Interactive Patient Education  2017 [employer].

## 2022-01-08 DIAGNOSIS — Z992 Dependence on renal dialysis: Secondary | ICD-10-CM | POA: Diagnosis not present

## 2022-01-08 DIAGNOSIS — N2581 Secondary hyperparathyroidism of renal origin: Secondary | ICD-10-CM | POA: Diagnosis not present

## 2022-01-08 DIAGNOSIS — D509 Iron deficiency anemia, unspecified: Secondary | ICD-10-CM | POA: Diagnosis not present

## 2022-01-08 DIAGNOSIS — D631 Anemia in chronic kidney disease: Secondary | ICD-10-CM | POA: Diagnosis not present

## 2022-01-08 DIAGNOSIS — N186 End stage renal disease: Secondary | ICD-10-CM | POA: Diagnosis not present

## 2022-01-09 ENCOUNTER — Other Ambulatory Visit: Payer: Self-pay | Admitting: Nurse Practitioner

## 2022-01-09 DIAGNOSIS — R053 Chronic cough: Secondary | ICD-10-CM

## 2022-01-11 DIAGNOSIS — N2581 Secondary hyperparathyroidism of renal origin: Secondary | ICD-10-CM | POA: Diagnosis not present

## 2022-01-11 DIAGNOSIS — D509 Iron deficiency anemia, unspecified: Secondary | ICD-10-CM | POA: Diagnosis not present

## 2022-01-11 DIAGNOSIS — Z992 Dependence on renal dialysis: Secondary | ICD-10-CM | POA: Diagnosis not present

## 2022-01-11 DIAGNOSIS — D631 Anemia in chronic kidney disease: Secondary | ICD-10-CM | POA: Diagnosis not present

## 2022-01-11 DIAGNOSIS — N186 End stage renal disease: Secondary | ICD-10-CM | POA: Diagnosis not present

## 2022-01-12 ENCOUNTER — Encounter (HOSPITAL_COMMUNITY): Payer: Self-pay | Admitting: *Deleted

## 2022-01-13 DIAGNOSIS — Z992 Dependence on renal dialysis: Secondary | ICD-10-CM | POA: Diagnosis not present

## 2022-01-13 DIAGNOSIS — D631 Anemia in chronic kidney disease: Secondary | ICD-10-CM | POA: Diagnosis not present

## 2022-01-13 DIAGNOSIS — D509 Iron deficiency anemia, unspecified: Secondary | ICD-10-CM | POA: Diagnosis not present

## 2022-01-13 DIAGNOSIS — N186 End stage renal disease: Secondary | ICD-10-CM | POA: Diagnosis not present

## 2022-01-13 DIAGNOSIS — N2581 Secondary hyperparathyroidism of renal origin: Secondary | ICD-10-CM | POA: Diagnosis not present

## 2022-01-15 DIAGNOSIS — N2581 Secondary hyperparathyroidism of renal origin: Secondary | ICD-10-CM | POA: Diagnosis not present

## 2022-01-15 DIAGNOSIS — D509 Iron deficiency anemia, unspecified: Secondary | ICD-10-CM | POA: Diagnosis not present

## 2022-01-15 DIAGNOSIS — D631 Anemia in chronic kidney disease: Secondary | ICD-10-CM | POA: Diagnosis not present

## 2022-01-15 DIAGNOSIS — N186 End stage renal disease: Secondary | ICD-10-CM | POA: Diagnosis not present

## 2022-01-15 DIAGNOSIS — Z992 Dependence on renal dialysis: Secondary | ICD-10-CM | POA: Diagnosis not present

## 2022-01-18 DIAGNOSIS — N2581 Secondary hyperparathyroidism of renal origin: Secondary | ICD-10-CM | POA: Diagnosis not present

## 2022-01-18 DIAGNOSIS — D631 Anemia in chronic kidney disease: Secondary | ICD-10-CM | POA: Diagnosis not present

## 2022-01-18 DIAGNOSIS — D509 Iron deficiency anemia, unspecified: Secondary | ICD-10-CM | POA: Diagnosis not present

## 2022-01-18 DIAGNOSIS — Z992 Dependence on renal dialysis: Secondary | ICD-10-CM | POA: Diagnosis not present

## 2022-01-18 DIAGNOSIS — N186 End stage renal disease: Secondary | ICD-10-CM | POA: Diagnosis not present

## 2022-01-20 DIAGNOSIS — N2581 Secondary hyperparathyroidism of renal origin: Secondary | ICD-10-CM | POA: Diagnosis not present

## 2022-01-20 DIAGNOSIS — D631 Anemia in chronic kidney disease: Secondary | ICD-10-CM | POA: Diagnosis not present

## 2022-01-20 DIAGNOSIS — N186 End stage renal disease: Secondary | ICD-10-CM | POA: Diagnosis not present

## 2022-01-20 DIAGNOSIS — Z992 Dependence on renal dialysis: Secondary | ICD-10-CM | POA: Diagnosis not present

## 2022-01-20 DIAGNOSIS — D509 Iron deficiency anemia, unspecified: Secondary | ICD-10-CM | POA: Diagnosis not present

## 2022-01-22 DIAGNOSIS — N186 End stage renal disease: Secondary | ICD-10-CM | POA: Diagnosis not present

## 2022-01-22 DIAGNOSIS — N2581 Secondary hyperparathyroidism of renal origin: Secondary | ICD-10-CM | POA: Diagnosis not present

## 2022-01-22 DIAGNOSIS — D631 Anemia in chronic kidney disease: Secondary | ICD-10-CM | POA: Diagnosis not present

## 2022-01-22 DIAGNOSIS — D509 Iron deficiency anemia, unspecified: Secondary | ICD-10-CM | POA: Diagnosis not present

## 2022-01-22 DIAGNOSIS — I120 Hypertensive chronic kidney disease with stage 5 chronic kidney disease or end stage renal disease: Secondary | ICD-10-CM | POA: Diagnosis not present

## 2022-01-22 DIAGNOSIS — Z992 Dependence on renal dialysis: Secondary | ICD-10-CM | POA: Diagnosis not present

## 2022-01-25 DIAGNOSIS — Z992 Dependence on renal dialysis: Secondary | ICD-10-CM | POA: Diagnosis not present

## 2022-01-25 DIAGNOSIS — D689 Coagulation defect, unspecified: Secondary | ICD-10-CM | POA: Diagnosis not present

## 2022-01-25 DIAGNOSIS — D631 Anemia in chronic kidney disease: Secondary | ICD-10-CM | POA: Diagnosis not present

## 2022-01-25 DIAGNOSIS — D509 Iron deficiency anemia, unspecified: Secondary | ICD-10-CM | POA: Diagnosis not present

## 2022-01-25 DIAGNOSIS — N186 End stage renal disease: Secondary | ICD-10-CM | POA: Diagnosis not present

## 2022-01-25 DIAGNOSIS — N2581 Secondary hyperparathyroidism of renal origin: Secondary | ICD-10-CM | POA: Diagnosis not present

## 2022-01-26 ENCOUNTER — Encounter: Payer: Self-pay | Admitting: Podiatry

## 2022-01-26 ENCOUNTER — Ambulatory Visit (INDEPENDENT_AMBULATORY_CARE_PROVIDER_SITE_OTHER): Payer: Medicare Other | Admitting: Podiatry

## 2022-01-26 DIAGNOSIS — Z7901 Long term (current) use of anticoagulants: Secondary | ICD-10-CM

## 2022-01-26 DIAGNOSIS — M79675 Pain in left toe(s): Secondary | ICD-10-CM

## 2022-01-26 DIAGNOSIS — M79674 Pain in right toe(s): Secondary | ICD-10-CM

## 2022-01-26 DIAGNOSIS — B351 Tinea unguium: Secondary | ICD-10-CM | POA: Diagnosis not present

## 2022-01-26 DIAGNOSIS — E119 Type 2 diabetes mellitus without complications: Secondary | ICD-10-CM

## 2022-01-26 DIAGNOSIS — N186 End stage renal disease: Secondary | ICD-10-CM

## 2022-01-26 NOTE — Progress Notes (Signed)
This patient returns to my office for at risk foot care.  This patient requires this care by a professional since this patient will be at risk due to having DM, chronic anticoagulation and ESRD.  This patient is unable to cut nails himself since the patient cannot reach his nails.These nails are painful walking and wearing shoes.  This patient presents for at risk foot care today.  General Appearance  Alert, conversant and in no acute stress.  Vascular  Dorsalis pedis and posterior tibial  pulses are palpable  bilaterally.  Capillary return is within normal limits  bilaterally. Temperature is within normal limits  bilaterally.  Neurologic  Senn-Weinstein monofilament wire test within normal limits  bilaterally. Muscle power within normal limits bilaterally.  Nails Thick disfigured discolored nails with subungual debris  from hallux to fifth toes bilaterally. No evidence of bacterial infection or drainage bilaterally.  Orthopedic  No limitations of motion  feet .  No crepitus or effusions noted.  No bony pathology or digital deformities noted.  Skin  normotropic skin with no porokeratosis noted bilaterally.  No signs of infections or ulcers noted.     Onychomycosis  Pain in right toes  Pain in left toes  Consent was obtained for treatment procedures.   Mechanical debridement of nails 1-5  bilaterally performed with a nail nipper.  Filed with dremel without incident.    Return office visit     3 months                 Told patient to return for periodic foot care and evaluation due to potential at risk complications.   Gardiner Barefoot DPM

## 2022-01-27 DIAGNOSIS — D631 Anemia in chronic kidney disease: Secondary | ICD-10-CM | POA: Diagnosis not present

## 2022-01-27 DIAGNOSIS — Z992 Dependence on renal dialysis: Secondary | ICD-10-CM | POA: Diagnosis not present

## 2022-01-27 DIAGNOSIS — M25812 Other specified joint disorders, left shoulder: Secondary | ICD-10-CM | POA: Diagnosis not present

## 2022-01-27 DIAGNOSIS — N186 End stage renal disease: Secondary | ICD-10-CM | POA: Diagnosis not present

## 2022-01-27 DIAGNOSIS — D509 Iron deficiency anemia, unspecified: Secondary | ICD-10-CM | POA: Diagnosis not present

## 2022-01-27 DIAGNOSIS — M25811 Other specified joint disorders, right shoulder: Secondary | ICD-10-CM | POA: Diagnosis not present

## 2022-01-27 DIAGNOSIS — N2581 Secondary hyperparathyroidism of renal origin: Secondary | ICD-10-CM | POA: Diagnosis not present

## 2022-01-28 DIAGNOSIS — D631 Anemia in chronic kidney disease: Secondary | ICD-10-CM | POA: Diagnosis not present

## 2022-01-28 DIAGNOSIS — D509 Iron deficiency anemia, unspecified: Secondary | ICD-10-CM | POA: Diagnosis not present

## 2022-01-28 DIAGNOSIS — N2581 Secondary hyperparathyroidism of renal origin: Secondary | ICD-10-CM | POA: Diagnosis not present

## 2022-01-28 DIAGNOSIS — Z992 Dependence on renal dialysis: Secondary | ICD-10-CM | POA: Diagnosis not present

## 2022-01-28 DIAGNOSIS — N186 End stage renal disease: Secondary | ICD-10-CM | POA: Diagnosis not present

## 2022-02-01 DIAGNOSIS — D509 Iron deficiency anemia, unspecified: Secondary | ICD-10-CM | POA: Diagnosis not present

## 2022-02-01 DIAGNOSIS — N2581 Secondary hyperparathyroidism of renal origin: Secondary | ICD-10-CM | POA: Diagnosis not present

## 2022-02-01 DIAGNOSIS — D631 Anemia in chronic kidney disease: Secondary | ICD-10-CM | POA: Diagnosis not present

## 2022-02-01 DIAGNOSIS — N186 End stage renal disease: Secondary | ICD-10-CM | POA: Diagnosis not present

## 2022-02-01 DIAGNOSIS — Z992 Dependence on renal dialysis: Secondary | ICD-10-CM | POA: Diagnosis not present

## 2022-02-03 DIAGNOSIS — N186 End stage renal disease: Secondary | ICD-10-CM | POA: Diagnosis not present

## 2022-02-03 DIAGNOSIS — N2581 Secondary hyperparathyroidism of renal origin: Secondary | ICD-10-CM | POA: Diagnosis not present

## 2022-02-03 DIAGNOSIS — Z992 Dependence on renal dialysis: Secondary | ICD-10-CM | POA: Diagnosis not present

## 2022-02-03 DIAGNOSIS — D509 Iron deficiency anemia, unspecified: Secondary | ICD-10-CM | POA: Diagnosis not present

## 2022-02-03 DIAGNOSIS — D631 Anemia in chronic kidney disease: Secondary | ICD-10-CM | POA: Diagnosis not present

## 2022-02-05 DIAGNOSIS — N2581 Secondary hyperparathyroidism of renal origin: Secondary | ICD-10-CM | POA: Diagnosis not present

## 2022-02-05 DIAGNOSIS — Z992 Dependence on renal dialysis: Secondary | ICD-10-CM | POA: Diagnosis not present

## 2022-02-05 DIAGNOSIS — D631 Anemia in chronic kidney disease: Secondary | ICD-10-CM | POA: Diagnosis not present

## 2022-02-05 DIAGNOSIS — N186 End stage renal disease: Secondary | ICD-10-CM | POA: Diagnosis not present

## 2022-02-05 DIAGNOSIS — D689 Coagulation defect, unspecified: Secondary | ICD-10-CM | POA: Diagnosis not present

## 2022-02-05 DIAGNOSIS — D509 Iron deficiency anemia, unspecified: Secondary | ICD-10-CM | POA: Diagnosis not present

## 2022-02-08 DIAGNOSIS — D509 Iron deficiency anemia, unspecified: Secondary | ICD-10-CM | POA: Diagnosis not present

## 2022-02-08 DIAGNOSIS — Z992 Dependence on renal dialysis: Secondary | ICD-10-CM | POA: Diagnosis not present

## 2022-02-08 DIAGNOSIS — N186 End stage renal disease: Secondary | ICD-10-CM | POA: Diagnosis not present

## 2022-02-08 DIAGNOSIS — N2581 Secondary hyperparathyroidism of renal origin: Secondary | ICD-10-CM | POA: Diagnosis not present

## 2022-02-08 DIAGNOSIS — D631 Anemia in chronic kidney disease: Secondary | ICD-10-CM | POA: Diagnosis not present

## 2022-02-10 DIAGNOSIS — N186 End stage renal disease: Secondary | ICD-10-CM | POA: Diagnosis not present

## 2022-02-10 DIAGNOSIS — D509 Iron deficiency anemia, unspecified: Secondary | ICD-10-CM | POA: Diagnosis not present

## 2022-02-10 DIAGNOSIS — D631 Anemia in chronic kidney disease: Secondary | ICD-10-CM | POA: Diagnosis not present

## 2022-02-10 DIAGNOSIS — Z992 Dependence on renal dialysis: Secondary | ICD-10-CM | POA: Diagnosis not present

## 2022-02-10 DIAGNOSIS — N2581 Secondary hyperparathyroidism of renal origin: Secondary | ICD-10-CM | POA: Diagnosis not present

## 2022-02-12 DIAGNOSIS — N186 End stage renal disease: Secondary | ICD-10-CM | POA: Diagnosis not present

## 2022-02-12 DIAGNOSIS — D509 Iron deficiency anemia, unspecified: Secondary | ICD-10-CM | POA: Diagnosis not present

## 2022-02-12 DIAGNOSIS — N2581 Secondary hyperparathyroidism of renal origin: Secondary | ICD-10-CM | POA: Diagnosis not present

## 2022-02-12 DIAGNOSIS — D631 Anemia in chronic kidney disease: Secondary | ICD-10-CM | POA: Diagnosis not present

## 2022-02-12 DIAGNOSIS — Z992 Dependence on renal dialysis: Secondary | ICD-10-CM | POA: Diagnosis not present

## 2022-02-15 ENCOUNTER — Other Ambulatory Visit: Payer: Self-pay | Admitting: Nurse Practitioner

## 2022-02-15 DIAGNOSIS — R053 Chronic cough: Secondary | ICD-10-CM

## 2022-02-15 DIAGNOSIS — D631 Anemia in chronic kidney disease: Secondary | ICD-10-CM | POA: Diagnosis not present

## 2022-02-15 DIAGNOSIS — D509 Iron deficiency anemia, unspecified: Secondary | ICD-10-CM | POA: Diagnosis not present

## 2022-02-15 DIAGNOSIS — Z992 Dependence on renal dialysis: Secondary | ICD-10-CM | POA: Diagnosis not present

## 2022-02-15 DIAGNOSIS — N186 End stage renal disease: Secondary | ICD-10-CM | POA: Diagnosis not present

## 2022-02-15 DIAGNOSIS — N2581 Secondary hyperparathyroidism of renal origin: Secondary | ICD-10-CM | POA: Diagnosis not present

## 2022-02-17 DIAGNOSIS — N186 End stage renal disease: Secondary | ICD-10-CM | POA: Diagnosis not present

## 2022-02-17 DIAGNOSIS — Z992 Dependence on renal dialysis: Secondary | ICD-10-CM | POA: Diagnosis not present

## 2022-02-17 DIAGNOSIS — D631 Anemia in chronic kidney disease: Secondary | ICD-10-CM | POA: Diagnosis not present

## 2022-02-17 DIAGNOSIS — N2581 Secondary hyperparathyroidism of renal origin: Secondary | ICD-10-CM | POA: Diagnosis not present

## 2022-02-17 DIAGNOSIS — D509 Iron deficiency anemia, unspecified: Secondary | ICD-10-CM | POA: Diagnosis not present

## 2022-02-19 DIAGNOSIS — D509 Iron deficiency anemia, unspecified: Secondary | ICD-10-CM | POA: Diagnosis not present

## 2022-02-19 DIAGNOSIS — N2581 Secondary hyperparathyroidism of renal origin: Secondary | ICD-10-CM | POA: Diagnosis not present

## 2022-02-19 DIAGNOSIS — Z992 Dependence on renal dialysis: Secondary | ICD-10-CM | POA: Diagnosis not present

## 2022-02-19 DIAGNOSIS — N186 End stage renal disease: Secondary | ICD-10-CM | POA: Diagnosis not present

## 2022-02-19 DIAGNOSIS — D631 Anemia in chronic kidney disease: Secondary | ICD-10-CM | POA: Diagnosis not present

## 2022-02-21 DIAGNOSIS — Z992 Dependence on renal dialysis: Secondary | ICD-10-CM | POA: Diagnosis not present

## 2022-02-21 DIAGNOSIS — I120 Hypertensive chronic kidney disease with stage 5 chronic kidney disease or end stage renal disease: Secondary | ICD-10-CM | POA: Diagnosis not present

## 2022-02-21 DIAGNOSIS — N186 End stage renal disease: Secondary | ICD-10-CM | POA: Diagnosis not present

## 2022-02-22 DIAGNOSIS — Z992 Dependence on renal dialysis: Secondary | ICD-10-CM | POA: Diagnosis not present

## 2022-02-22 DIAGNOSIS — D509 Iron deficiency anemia, unspecified: Secondary | ICD-10-CM | POA: Diagnosis not present

## 2022-02-22 DIAGNOSIS — N2581 Secondary hyperparathyroidism of renal origin: Secondary | ICD-10-CM | POA: Diagnosis not present

## 2022-02-22 DIAGNOSIS — N186 End stage renal disease: Secondary | ICD-10-CM | POA: Diagnosis not present

## 2022-02-22 DIAGNOSIS — D631 Anemia in chronic kidney disease: Secondary | ICD-10-CM | POA: Diagnosis not present

## 2022-02-23 ENCOUNTER — Other Ambulatory Visit (HOSPITAL_COMMUNITY): Payer: Self-pay | Admitting: Cardiovascular Disease

## 2022-02-23 DIAGNOSIS — I2510 Atherosclerotic heart disease of native coronary artery without angina pectoris: Secondary | ICD-10-CM | POA: Diagnosis not present

## 2022-02-23 DIAGNOSIS — N186 End stage renal disease: Secondary | ICD-10-CM | POA: Diagnosis not present

## 2022-02-23 DIAGNOSIS — I5022 Chronic systolic (congestive) heart failure: Secondary | ICD-10-CM | POA: Diagnosis not present

## 2022-02-23 DIAGNOSIS — I5023 Acute on chronic systolic (congestive) heart failure: Secondary | ICD-10-CM

## 2022-02-23 DIAGNOSIS — M1711 Unilateral primary osteoarthritis, right knee: Secondary | ICD-10-CM | POA: Diagnosis not present

## 2022-02-23 DIAGNOSIS — R072 Precordial pain: Secondary | ICD-10-CM | POA: Diagnosis not present

## 2022-02-24 ENCOUNTER — Ambulatory Visit (HOSPITAL_COMMUNITY)
Admission: RE | Admit: 2022-02-24 | Discharge: 2022-02-24 | Disposition: A | Discharge status: Home / Self Care | Payer: Medicare Other | Source: Ambulatory Visit | Attending: Cardiovascular Disease | Admitting: Cardiovascular Disease

## 2022-02-24 DIAGNOSIS — N186 End stage renal disease: Secondary | ICD-10-CM | POA: Diagnosis not present

## 2022-02-24 DIAGNOSIS — D631 Anemia in chronic kidney disease: Secondary | ICD-10-CM | POA: Diagnosis not present

## 2022-02-24 DIAGNOSIS — R0602 Shortness of breath: Secondary | ICD-10-CM | POA: Diagnosis not present

## 2022-02-24 DIAGNOSIS — N2581 Secondary hyperparathyroidism of renal origin: Secondary | ICD-10-CM | POA: Diagnosis not present

## 2022-02-24 DIAGNOSIS — I340 Nonrheumatic mitral (valve) insufficiency: Secondary | ICD-10-CM | POA: Diagnosis not present

## 2022-02-24 DIAGNOSIS — I5023 Acute on chronic systolic (congestive) heart failure: Secondary | ICD-10-CM | POA: Diagnosis not present

## 2022-02-24 DIAGNOSIS — Z992 Dependence on renal dialysis: Secondary | ICD-10-CM | POA: Diagnosis not present

## 2022-02-24 DIAGNOSIS — D509 Iron deficiency anemia, unspecified: Secondary | ICD-10-CM | POA: Diagnosis not present

## 2022-02-24 LAB — ECHOCARDIOGRAM COMPLETE
AR max vel: 1.96 cm2
AV Area VTI: 2.38 cm2
AV Area mean vel: 2.28 cm2
AV Mean grad: 7 mmHg
AV Peak grad: 13.4 mmHg
Ao pk vel: 1.83 m/s
Area-P 1/2: 4.31 cm2
Calc EF: 35.1 %
MV M vel: 3.85 m/s
MV Peak grad: 59.3 mmHg
S' Lateral: 4.9 cm
Single Plane A2C EF: 34.4 %
Single Plane A4C EF: 36.5 %

## 2022-02-26 DIAGNOSIS — N2581 Secondary hyperparathyroidism of renal origin: Secondary | ICD-10-CM | POA: Diagnosis not present

## 2022-02-26 DIAGNOSIS — N186 End stage renal disease: Secondary | ICD-10-CM | POA: Diagnosis not present

## 2022-02-26 DIAGNOSIS — D631 Anemia in chronic kidney disease: Secondary | ICD-10-CM | POA: Diagnosis not present

## 2022-02-26 DIAGNOSIS — Z992 Dependence on renal dialysis: Secondary | ICD-10-CM | POA: Diagnosis not present

## 2022-02-26 DIAGNOSIS — D509 Iron deficiency anemia, unspecified: Secondary | ICD-10-CM | POA: Diagnosis not present

## 2022-03-01 DIAGNOSIS — N186 End stage renal disease: Secondary | ICD-10-CM | POA: Diagnosis not present

## 2022-03-01 DIAGNOSIS — N2581 Secondary hyperparathyroidism of renal origin: Secondary | ICD-10-CM | POA: Diagnosis not present

## 2022-03-01 DIAGNOSIS — D689 Coagulation defect, unspecified: Secondary | ICD-10-CM | POA: Diagnosis not present

## 2022-03-01 DIAGNOSIS — E1129 Type 2 diabetes mellitus with other diabetic kidney complication: Secondary | ICD-10-CM | POA: Diagnosis not present

## 2022-03-01 DIAGNOSIS — D631 Anemia in chronic kidney disease: Secondary | ICD-10-CM | POA: Diagnosis not present

## 2022-03-01 DIAGNOSIS — Z992 Dependence on renal dialysis: Secondary | ICD-10-CM | POA: Diagnosis not present

## 2022-03-01 DIAGNOSIS — D509 Iron deficiency anemia, unspecified: Secondary | ICD-10-CM | POA: Diagnosis not present

## 2022-03-03 DIAGNOSIS — D631 Anemia in chronic kidney disease: Secondary | ICD-10-CM | POA: Diagnosis not present

## 2022-03-03 DIAGNOSIS — Z992 Dependence on renal dialysis: Secondary | ICD-10-CM | POA: Diagnosis not present

## 2022-03-03 DIAGNOSIS — D509 Iron deficiency anemia, unspecified: Secondary | ICD-10-CM | POA: Diagnosis not present

## 2022-03-03 DIAGNOSIS — N186 End stage renal disease: Secondary | ICD-10-CM | POA: Diagnosis not present

## 2022-03-03 DIAGNOSIS — N2581 Secondary hyperparathyroidism of renal origin: Secondary | ICD-10-CM | POA: Diagnosis not present

## 2022-03-05 DIAGNOSIS — D631 Anemia in chronic kidney disease: Secondary | ICD-10-CM | POA: Diagnosis not present

## 2022-03-05 DIAGNOSIS — Z992 Dependence on renal dialysis: Secondary | ICD-10-CM | POA: Diagnosis not present

## 2022-03-05 DIAGNOSIS — D509 Iron deficiency anemia, unspecified: Secondary | ICD-10-CM | POA: Diagnosis not present

## 2022-03-05 DIAGNOSIS — N186 End stage renal disease: Secondary | ICD-10-CM | POA: Diagnosis not present

## 2022-03-05 DIAGNOSIS — N2581 Secondary hyperparathyroidism of renal origin: Secondary | ICD-10-CM | POA: Diagnosis not present

## 2022-03-08 DIAGNOSIS — N2581 Secondary hyperparathyroidism of renal origin: Secondary | ICD-10-CM | POA: Diagnosis not present

## 2022-03-08 DIAGNOSIS — D631 Anemia in chronic kidney disease: Secondary | ICD-10-CM | POA: Diagnosis not present

## 2022-03-08 DIAGNOSIS — D689 Coagulation defect, unspecified: Secondary | ICD-10-CM | POA: Diagnosis not present

## 2022-03-08 DIAGNOSIS — Z992 Dependence on renal dialysis: Secondary | ICD-10-CM | POA: Diagnosis not present

## 2022-03-08 DIAGNOSIS — N186 End stage renal disease: Secondary | ICD-10-CM | POA: Diagnosis not present

## 2022-03-08 DIAGNOSIS — D509 Iron deficiency anemia, unspecified: Secondary | ICD-10-CM | POA: Diagnosis not present

## 2022-03-10 DIAGNOSIS — N186 End stage renal disease: Secondary | ICD-10-CM | POA: Diagnosis not present

## 2022-03-10 DIAGNOSIS — D509 Iron deficiency anemia, unspecified: Secondary | ICD-10-CM | POA: Diagnosis not present

## 2022-03-10 DIAGNOSIS — D631 Anemia in chronic kidney disease: Secondary | ICD-10-CM | POA: Diagnosis not present

## 2022-03-10 DIAGNOSIS — Z992 Dependence on renal dialysis: Secondary | ICD-10-CM | POA: Diagnosis not present

## 2022-03-10 DIAGNOSIS — N2581 Secondary hyperparathyroidism of renal origin: Secondary | ICD-10-CM | POA: Diagnosis not present

## 2022-03-12 DIAGNOSIS — N2581 Secondary hyperparathyroidism of renal origin: Secondary | ICD-10-CM | POA: Diagnosis not present

## 2022-03-12 DIAGNOSIS — Z992 Dependence on renal dialysis: Secondary | ICD-10-CM | POA: Diagnosis not present

## 2022-03-12 DIAGNOSIS — D631 Anemia in chronic kidney disease: Secondary | ICD-10-CM | POA: Diagnosis not present

## 2022-03-12 DIAGNOSIS — N186 End stage renal disease: Secondary | ICD-10-CM | POA: Diagnosis not present

## 2022-03-12 DIAGNOSIS — D509 Iron deficiency anemia, unspecified: Secondary | ICD-10-CM | POA: Diagnosis not present

## 2022-03-15 DIAGNOSIS — N186 End stage renal disease: Secondary | ICD-10-CM | POA: Diagnosis not present

## 2022-03-15 DIAGNOSIS — Z992 Dependence on renal dialysis: Secondary | ICD-10-CM | POA: Diagnosis not present

## 2022-03-15 DIAGNOSIS — D631 Anemia in chronic kidney disease: Secondary | ICD-10-CM | POA: Diagnosis not present

## 2022-03-15 DIAGNOSIS — D509 Iron deficiency anemia, unspecified: Secondary | ICD-10-CM | POA: Diagnosis not present

## 2022-03-15 DIAGNOSIS — N2581 Secondary hyperparathyroidism of renal origin: Secondary | ICD-10-CM | POA: Diagnosis not present

## 2022-03-16 ENCOUNTER — Encounter: Payer: Self-pay | Admitting: Nurse Practitioner

## 2022-03-16 ENCOUNTER — Ambulatory Visit (INDEPENDENT_AMBULATORY_CARE_PROVIDER_SITE_OTHER): Payer: Medicare Other | Admitting: Nurse Practitioner

## 2022-03-16 VITALS — BP 124/78 | HR 98 | Temp 98.1°F | Ht 68.0 in | Wt 237.8 lb

## 2022-03-16 DIAGNOSIS — Z6836 Body mass index (BMI) 36.0-36.9, adult: Secondary | ICD-10-CM | POA: Diagnosis not present

## 2022-03-16 DIAGNOSIS — Z23 Encounter for immunization: Secondary | ICD-10-CM

## 2022-03-16 DIAGNOSIS — E6609 Other obesity due to excess calories: Secondary | ICD-10-CM

## 2022-03-16 DIAGNOSIS — Z992 Dependence on renal dialysis: Secondary | ICD-10-CM

## 2022-03-16 DIAGNOSIS — E559 Vitamin D deficiency, unspecified: Secondary | ICD-10-CM | POA: Diagnosis not present

## 2022-03-16 DIAGNOSIS — M25511 Pain in right shoulder: Secondary | ICD-10-CM

## 2022-03-16 DIAGNOSIS — N186 End stage renal disease: Secondary | ICD-10-CM

## 2022-03-16 DIAGNOSIS — M25811 Other specified joint disorders, right shoulder: Secondary | ICD-10-CM | POA: Diagnosis not present

## 2022-03-16 DIAGNOSIS — M25512 Pain in left shoulder: Secondary | ICD-10-CM

## 2022-03-16 DIAGNOSIS — I132 Hypertensive heart and chronic kidney disease with heart failure and with stage 5 chronic kidney disease, or end stage renal disease: Secondary | ICD-10-CM | POA: Diagnosis not present

## 2022-03-16 DIAGNOSIS — E782 Mixed hyperlipidemia: Secondary | ICD-10-CM

## 2022-03-16 DIAGNOSIS — M170 Bilateral primary osteoarthritis of knee: Secondary | ICD-10-CM | POA: Diagnosis not present

## 2022-03-16 DIAGNOSIS — Z2821 Immunization not carried out because of patient refusal: Secondary | ICD-10-CM

## 2022-03-16 DIAGNOSIS — E66812 Obesity, class 2: Secondary | ICD-10-CM

## 2022-03-16 DIAGNOSIS — Z993 Dependence on wheelchair: Secondary | ICD-10-CM

## 2022-03-16 DIAGNOSIS — G8929 Other chronic pain: Secondary | ICD-10-CM

## 2022-03-16 DIAGNOSIS — Z79899 Other long term (current) drug therapy: Secondary | ICD-10-CM | POA: Diagnosis not present

## 2022-03-16 DIAGNOSIS — M25812 Other specified joint disorders, left shoulder: Secondary | ICD-10-CM | POA: Diagnosis not present

## 2022-03-16 DIAGNOSIS — E1122 Type 2 diabetes mellitus with diabetic chronic kidney disease: Secondary | ICD-10-CM

## 2022-03-16 MED ORDER — ZOSTER VAC RECOMB ADJUVANTED 50 MCG/0.5ML IM SUSR: 0.5000 mL | Freq: Once | INTRAMUSCULAR | 0 refills | Status: AC

## 2022-03-16 NOTE — Patient Instructions (Signed)

## 2022-03-16 NOTE — Progress Notes (Signed)
I,Victoria T Hamilton,acting as a Education administrator for [name], FNP.,have documented all relevant documentation on the behalf of [name], FNP,as directed by  [name], FNP while in the presence of [name], [place].    Subjective:     Patient ID: [name] , male    DOB: Apr 17, 1961 , 61 y.o.   MRN: [id number]   Chief Complaint  Patient presents with   Diabetes    HPI  Patient is here for a diabetes follow up. Continues with dialysis MWF. His knees are more weak and balance is off. He last time he had physical therapy. He has bone on bone to his knees and receiving cortisone injections. He is having more trouble with ambulating. He has bone on bone to his shoulders. He has bilateral rotator cuff tears. He is unable to wheel himself in a self propel wheelchair. He is able to stand but not for long no more than 5 minutes. He had difficulty with getting on the scale here in the office.  His wife is having to assist with dressing and bathing. He had an echocardiogram done to see if he can have surgery on one arm or another. They are looking at the left shoulder with the dialysis. He had a near fall when he slid down to the couch. He is unable to maneuver the stairs. Has not had any falls. When sitting in the chair it takes him a while to get up out of the chair. Unable to lift by his arms and she will have to use his pants to pull him up. They are in the process of getting Medicaid. Uses walker inside the home.   He gets winded when walking long distances. Just had an Echocardiogram.   Patient reports he would like a prescription for new wheelchair. He is having trouble with using his arms while using wheelchair.  Patient not covered for shingles in office, denied.   Diabetes He presents for his follow-up diabetic visit. He has type 2 diabetes mellitus. There are no hypoglycemic associated symptoms. There are no diabetic associated symptoms. There are no hypoglycemic complications. There  are no diabetic complications. Risk factors for coronary artery disease include obesity, sedentary lifestyle, male sex and diabetes mellitus. (He will spot check his blood sugar was 102 last time he checked. )     Past Medical History:  Diagnosis Date   Acute lower GI bleeding 06/21/2018   Acute respiratory failure with hypoxia (HCC) 01/13/2015   Atrial flutter (HCC)    Bile leak, postoperative 03/15/2016   Biliary dyskinesia 02/12/2016   Blind right eye    Hx: of partial blindness in right eye   Cardiomyopathy    CHF (congestive heart failure) ([place])    Coronary artery disease    normal coronaries by 10/10/08 cath, Cardiac Cath 08-04-12 epic.Dr. Doylene Canard follows   Diabetes mellitus    pt. states he's borderline diabetic., no longer taking med,- off med. since 2013   Dialysis patient [facility])    Mon-Wed-Fri([place])- Left AV fistula   Diverticulitis November 2016   reoccurred in December 2016   Diverticulitis of intestine without perforation or abscess without bleeding 04/21/2015   DVT (deep venous thrombosis) (HCC)    ESRD (end stage renal disease) ([place])    GERD (gastroesophageal reflux disease)    Gout    Hemorrhage of left kidney 01/13/2018   History of nephrectomy 07/04/2012   History of right nephrectomy in 2002 for renal cell carcinoma  History of unilateral nephrectomy    Hypertension    Low iron    Myocardial infarction [facility]) ?2006   Renal cell carcinoma    dialysis- MWF- [address]- Dr. Mercy  follows.   Renal insufficiency    Shortness of breath 05/19/11   "at rest, lying down, w/exertion"   Stroke [facility]) 02/2011   05/19/11 denies residual   Umbilical hernia 94/17/40   unrepaired   Wears glasses      Family History  Problem Relation Age of Onset   Hypertension Mother    Kidney disease Mother      Current Outpatient Medications:    albuterol (VENTOLIN HFA) 108 (90 Base) MCG/ACT inhaler, Inhale 2 puffs into the lungs every 6 (six) hours as  needed for wheezing or shortness of breath., Disp: 18 g, Rfl: 2   calcium acetate (PHOSLO) 667 MG capsule, Take 1-3 capsules (667-2,001 mg total) by mouth See admin instructions. Take 2001 mg capsules with meals three times daily and 667 mg capsule with snacks. (Patient taking differently: Take 2,001 mg by mouth daily.), Disp:  , Rfl:    chlorhexidine (PERIDEX) 0.12 % solution, 15 mLs by Mouth Rinse route 2 (two) times daily., Disp: , Rfl:    cinacalcet (SENSIPAR) 90 MG tablet, Take 180 mg by mouth daily., Disp: , Rfl:    Control Gel Formula Dressing (DUODERM CGF EXTRA THIN) MISC, Apply 1 each topically daily as needed., Disp: 10 each, Rfl: 2   diclofenac Sodium (VOLTAREN) 1 % GEL, Apply 2 g topically 4 (four) times daily., Disp: 100 g, Rfl: 2   digoxin (LANOXIN) 0.125 MG tablet, Take 1 tablet (0.125 mg total) by mouth every Monday, Wednesday, and Friday., Disp: 30 tablet, Rfl: 1   ferric citrate (AURYXIA) 1 GM 210 MG(Fe) tablet, Take 630 mg by mouth 3 (three) times daily with meals., Disp: , Rfl:    fluticasone furoate-vilanterol (BREO ELLIPTA) 100-25 MCG/ACT AEPB, Inhale 1 puff into the lungs daily., Disp: 60 each, Rfl: 6   gabapentin (NEURONTIN) 300 MG capsule, Take 1 capsule (300 mg total) by mouth 3 (three) times daily., Disp: 180 capsule, Rfl: 1   glucose blood test strip, Use to check blood sugar 3 times a day. Dx code e11.65, Disp: 100 each, Rfl: 12   ipratropium (ATROVENT) 0.06 % nasal spray, Place 2 sprays into both nostrils 4 (four) times daily. (Patient taking differently: Place 2 sprays into both nostrils 4 (four) times daily as needed for rhinitis.), Disp: 15 mL, Rfl: 12   isosorbide dinitrate (ISORDIL) 10 MG tablet, Take 10 mg by mouth 2 (two) times daily., Disp: , Rfl:    Lactobacillus (ACIDOPHILUS/BIFIDUS PO), Take 1 capsule by mouth daily. Written by Dr. Collene Mares to take once per day., Disp: , Rfl:    Lancets (ONETOUCH DELICA PLUS CXKGYJ85U) MISC, Use to check blood sugar 3 times a day.  Dx code e11.65, Disp: 100 each, Rfl: 3   lidocaine-prilocaine (EMLA) cream, Apply 1 application topically See admin instructions. Apply topically to port access prior to dialysis - Monday, Wednesday, Friday., Disp: , Rfl: 12   loperamide (IMODIUM A-D) 2 MG tablet, Take 1 tablet (2 mg total) by mouth 4 (four) times daily as needed for diarrhea or loose stools. Also available OTC., Disp: 30 tablet, Rfl: 0   meclizine (ANTIVERT) 25 MG tablet, Take 1 tablet (25 mg total) by mouth 3 (three) times daily as needed for dizziness., Disp: 30 tablet, Rfl: 0   metoprolol succinate (TOPROL-XL) 25 MG 24 hr  tablet, Take 1 tablet (25 mg total) by mouth daily., Disp: 30 tablet, Rfl: 3   midodrine (PROAMATINE) 10 MG tablet, Take 1 tablet (10 mg total) by mouth 3 (three) times daily., Disp: 90 tablet, Rfl: 3   multivitamin (RENA-VIT) TABS tablet, Take 1 tablet by mouth at bedtime., Disp: , Rfl:    nitroGLYCERIN (NITROSTAT) 0.4 MG SL tablet, Place 0.4 mg under the tongue every 5 (five) minutes as needed for chest pain. , Disp: , Rfl: 0   Nutritional Supplements (FEEDING SUPPLEMENT, NEPRO CARB STEADY,) LIQD, Take 237 mLs by mouth daily., Disp: , Rfl:    ondansetron (ZOFRAN ODT) 4 MG disintegrating tablet, Take 1 tablet (4 mg total) by mouth every 8 (eight) hours as needed for nausea or vomiting., Disp: 20 tablet, Rfl: 0   oxyCODONE-acetaminophen (PERCOCET/ROXICET) 5-325 MG tablet, Take 1 tablet by mouth every 4 (four) hours as needed for severe pain., Disp: 30 tablet, Rfl: 0   pantoprazole (PROTONIX) 40 MG tablet, Take 40 mg by mouth 2 (two) times daily., Disp: , Rfl:    sucroferric oxyhydroxide (VELPHORO) 500 MG chewable tablet, Chew 500-1,000 mg by mouth See admin instructions. Takes 2 tablets with each meal and 1 tablet with snacks., Disp: , Rfl:    UNABLE TO FIND, Out patient physical therapy- vestibular therapy for BPPV, Disp: 1 each, Rfl: 0   warfarin (COUMADIN) 7.5 MG tablet, Take 7.5 mg by mouth daily., Disp: ,  Rfl:    HYDROcodone bit-homatropine (HYDROMET) 5-1.5 MG/5ML syrup, Take 5 mLs by mouth every 6 (six) hours as needed for cough. (Patient not taking: Reported on 03/16/2022), Disp: 120 mL, Rfl: 0   metoprolol tartrate (LOPRESSOR) 25 MG tablet, Take 25 mg by mouth 2 (two) times daily. (Patient not taking: Reported on 03/16/2022), Disp: , Rfl:    Allergies  Allergen Reactions   Ace Inhibitors Itching, Cough and Other (See Comments)     Review of Systems  Constitutional: Negative.   HENT: Negative.    Respiratory: Negative.    Gastrointestinal: Negative.   Musculoskeletal: Negative.   Allergic/Immunologic: Negative.   Neurological: Negative.   Hematological: Negative.      Today's Vitals   03/16/22 0940  BP: 124/78  Pulse: 98  Temp: 98.1 F (36.7 C)  SpO2: 98%  Weight: 237 lb 12.8 oz (107.9 kg)  Height: 5' 8" (1.727 m)  PainSc: 0-No pain   Body mass index is 36.16 kg/m.  Wt Readings from Last 3 Encounters:  03/16/22 237 lb 12.8 oz (107.9 kg)  01/07/22 240 lb 9.6 oz (109.1 kg)  11/10/21 232 lb 9.6 oz (105.5 kg)    Objective:  Physical Exam Vitals reviewed.  Constitutional:      General: He is not in acute distress.    Appearance: Normal appearance. He is obese.  Cardiovascular:     Rate and Rhythm: Normal rate and regular rhythm.     Pulses: Normal pulses.     Heart sounds: Normal heart sounds. No murmur heard. Pulmonary:     Effort: Pulmonary effort is normal. No respiratory distress.     Breath sounds: Normal breath sounds. No wheezing.  Musculoskeletal:        General: Tenderness (left shoulder and posterior deltoid) present. No swelling or deformity.     Comments: Bilateral shoulders are limited with the left worse than right. Weakness bilateral lower extremities.   Skin:    General: Skin is warm and dry.     Capillary Refill: Capillary refill takes less than   2 seconds.  Neurological:     General: No focal deficit present.     Mental Status: He is alert and  oriented to person, place, and time.     Cranial Nerves: No cranial nerve deficit.     Motor: No weakness.  Psychiatric:        Mood and Affect: Mood normal.        Behavior: Behavior normal.        Thought Content: Thought content normal.        Judgment: Judgment normal.         Assessment And Plan:     1. Type 2 diabetes mellitus with end-stage renal disease ([place]) Comments: Stable, Continue current medications - CMP14+EGFR - Hemoglobin A1c  2. Hypertensive heart and CKD, ESRD on dialysis, w CHF ([place]) Comments: Blood pressure is well controlled, continue current medications. - CMP14+EGFR  3. Mixed hyperlipidemia Comments: Cholesterol levels are stable, continue statin, tolerating well. - CMP14+EGFR - Lipid panel  4. Wheelchair dependent Comments: He is having more difficulty getting around and is becoming winded with walking. He would do better with a motorized wheelchair due to weakness and dyspnea. His quality of life will be improved with a motorized wheelchair  5. Chronic pain of both shoulders Comments: Pain with weakness and limited range of motion. Unable to maneuver a standard wheelchair  6. End-stage renal disease on hemodialysis ([place]) Comments: Continue dialysis 3 days a week  7. Vitamin D deficiency Will check vitamin D level and supplement as needed.    Also encouraged to spend 15 minutes in the sun daily.   8. Influenza vaccination declined Patient declined influenza vaccination at this time. Patient is aware that influenza vaccine prevents illness in 70% of healthy people, and reduces hospitalizations to 30-70% in elderly. This vaccine is recommended annually. Education has been provided regarding the importance of this vaccine but patient still declined. Advised may receive this vaccine at local pharmacy or Health Dept.or vaccine clinic. Aware to provide a copy of the vaccination record if obtained from local pharmacy or Health Dept.  Pt is willing to accept  risk associated with refusing vaccination.  9. Need for shingles vaccine TransRx done and declined, Rx sent to pharmacy - Zoster Vaccine Adjuvanted [facility] injection; Inject 0.5 mLs into the muscle once for 1 dose.  Dispense: 0.5 mL; Refill: 0  10. Class 2 obesity due to excess calories without serious comorbidity with body mass index (BMI) of 36.0 to 36.9 in adult He is encouraged to strive for BMI less than 30 to decrease cardiac risk. Advised to aim for at least 150 minutes of exercise per week.   11. Other long term (current) drug therapy - CBC    Patient was given opportunity to ask questions. Patient verbalized understanding of the plan and was able to repeat key elements of the plan. All questions were answered to their satisfaction.  [name], FNP   I, [name], FNP, have reviewed all documentation for this visit. The documentation on 03/16/22 for the exam, diagnosis, procedures, and orders are all accurate and complete.   IF YOU HAVE BEEN REFERRED TO A SPECIALIST, IT MAY TAKE 1-2 WEEKS TO SCHEDULE/PROCESS THE REFERRAL. IF YOU HAVE NOT HEARD FROM US/SPECIALIST IN TWO WEEKS, PLEASE GIVE [place] A CALL AT [phone].   THE PATIENT IS ENCOURAGED TO PRACTICE SOCIAL DISTANCING DUE TO THE COVID-19 PANDEMIC.

## 2022-03-17 DIAGNOSIS — D631 Anemia in chronic kidney disease: Secondary | ICD-10-CM | POA: Diagnosis not present

## 2022-03-17 DIAGNOSIS — Z992 Dependence on renal dialysis: Secondary | ICD-10-CM | POA: Diagnosis not present

## 2022-03-17 DIAGNOSIS — N186 End stage renal disease: Secondary | ICD-10-CM | POA: Diagnosis not present

## 2022-03-17 DIAGNOSIS — D509 Iron deficiency anemia, unspecified: Secondary | ICD-10-CM | POA: Diagnosis not present

## 2022-03-17 DIAGNOSIS — N2581 Secondary hyperparathyroidism of renal origin: Secondary | ICD-10-CM | POA: Diagnosis not present

## 2022-03-17 LAB — CMP14+EGFR
ALT: 10 IU/L (ref 0–44)
AST: 11 IU/L (ref 0–40)
Albumin/Globulin Ratio: 1.1 — ABNORMAL LOW (ref 1.2–2.2)
Albumin: 4.2 g/dL (ref 3.8–4.9)
Alkaline Phosphatase: 176 IU/L — ABNORMAL HIGH (ref 44–121)
BUN/Creatinine Ratio: 4 — ABNORMAL LOW (ref 9–20)
BUN: 39 mg/dL — ABNORMAL HIGH (ref 6–24)
Bilirubin Total: 0.3 mg/dL (ref 0.0–1.2)
CO2: 23 mmol/L (ref 20–29)
Calcium: 8.7 mg/dL (ref 8.7–10.2)
Chloride: 92 mmol/L — ABNORMAL LOW (ref 96–106)
Creatinine, Ser: 8.98 mg/dL — ABNORMAL HIGH (ref 0.76–1.27)
Globulin, Total: 3.7 g/dL (ref 1.5–4.5)
Glucose: 100 mg/dL — ABNORMAL HIGH (ref 70–99)
Potassium: 4.4 mmol/L (ref 3.5–5.2)
Sodium: 139 mmol/L (ref 134–144)
Total Protein: 7.9 g/dL (ref 6.0–8.5)
eGFR: 6 mL/min/{1.73_m2} — ABNORMAL LOW (ref >59)

## 2022-03-17 LAB — HEMOGLOBIN A1C
Est. average glucose Bld gHb Est-mCnc: 105 mg/dL
Hgb A1c MFr Bld: 5.3 % (ref 4.8–5.6)

## 2022-03-17 LAB — LIPID PANEL
Chol/HDL Ratio: 3.3 ratio (ref 0.0–5.0)
Cholesterol, Total: 197 mg/dL (ref 100–199)
HDL: 59 mg/dL (ref >39)
LDL Chol Calc (NIH): 114 mg/dL — ABNORMAL HIGH (ref 0–99)
Triglycerides: 135 mg/dL (ref 0–149)
VLDL Cholesterol Cal: 24 mg/dL (ref 5–40)

## 2022-03-17 LAB — CBC
Hematocrit: 35.7 % — ABNORMAL LOW (ref 37.5–51.0)
Hemoglobin: 11.3 g/dL — ABNORMAL LOW (ref 13.0–17.7)
MCH: 30 pg (ref 26.6–33.0)
MCHC: 31.7 g/dL (ref 31.5–35.7)
MCV: 95 fL (ref 79–97)
Platelets: 285 10*3/uL (ref 150–450)
RBC: 3.77 x10E6/uL — ABNORMAL LOW (ref 4.14–5.80)
RDW: 13.4 % (ref 11.6–15.4)
WBC: 6.8 10*3/uL (ref 3.4–10.8)

## 2022-03-19 DIAGNOSIS — N2581 Secondary hyperparathyroidism of renal origin: Secondary | ICD-10-CM | POA: Diagnosis not present

## 2022-03-19 DIAGNOSIS — D509 Iron deficiency anemia, unspecified: Secondary | ICD-10-CM | POA: Diagnosis not present

## 2022-03-19 DIAGNOSIS — D631 Anemia in chronic kidney disease: Secondary | ICD-10-CM | POA: Diagnosis not present

## 2022-03-19 DIAGNOSIS — N186 End stage renal disease: Secondary | ICD-10-CM | POA: Diagnosis not present

## 2022-03-19 DIAGNOSIS — Z992 Dependence on renal dialysis: Secondary | ICD-10-CM | POA: Diagnosis not present

## 2022-03-21 DIAGNOSIS — M25511 Pain in right shoulder: Secondary | ICD-10-CM | POA: Diagnosis not present

## 2022-03-22 DIAGNOSIS — D631 Anemia in chronic kidney disease: Secondary | ICD-10-CM | POA: Diagnosis not present

## 2022-03-22 DIAGNOSIS — N2581 Secondary hyperparathyroidism of renal origin: Secondary | ICD-10-CM | POA: Diagnosis not present

## 2022-03-22 DIAGNOSIS — D509 Iron deficiency anemia, unspecified: Secondary | ICD-10-CM | POA: Diagnosis not present

## 2022-03-22 DIAGNOSIS — Z992 Dependence on renal dialysis: Secondary | ICD-10-CM | POA: Diagnosis not present

## 2022-03-22 DIAGNOSIS — N186 End stage renal disease: Secondary | ICD-10-CM | POA: Diagnosis not present

## 2022-03-24 DIAGNOSIS — I120 Hypertensive chronic kidney disease with stage 5 chronic kidney disease or end stage renal disease: Secondary | ICD-10-CM | POA: Diagnosis not present

## 2022-03-24 DIAGNOSIS — N2581 Secondary hyperparathyroidism of renal origin: Secondary | ICD-10-CM | POA: Diagnosis not present

## 2022-03-24 DIAGNOSIS — D509 Iron deficiency anemia, unspecified: Secondary | ICD-10-CM | POA: Diagnosis not present

## 2022-03-24 DIAGNOSIS — N186 End stage renal disease: Secondary | ICD-10-CM | POA: Diagnosis not present

## 2022-03-24 DIAGNOSIS — Z992 Dependence on renal dialysis: Secondary | ICD-10-CM | POA: Diagnosis not present

## 2022-03-24 DIAGNOSIS — D631 Anemia in chronic kidney disease: Secondary | ICD-10-CM | POA: Diagnosis not present

## 2022-03-25 DIAGNOSIS — E669 Obesity, unspecified: Secondary | ICD-10-CM | POA: Diagnosis not present

## 2022-03-25 DIAGNOSIS — K760 Fatty (change of) liver, not elsewhere classified: Secondary | ICD-10-CM | POA: Diagnosis not present

## 2022-03-25 DIAGNOSIS — K5730 Diverticulosis of large intestine without perforation or abscess without bleeding: Secondary | ICD-10-CM | POA: Diagnosis not present

## 2022-03-25 DIAGNOSIS — K9089 Other intestinal malabsorption: Secondary | ICD-10-CM | POA: Diagnosis not present

## 2022-03-26 DIAGNOSIS — D631 Anemia in chronic kidney disease: Secondary | ICD-10-CM | POA: Diagnosis not present

## 2022-03-26 DIAGNOSIS — D509 Iron deficiency anemia, unspecified: Secondary | ICD-10-CM | POA: Diagnosis not present

## 2022-03-26 DIAGNOSIS — Z992 Dependence on renal dialysis: Secondary | ICD-10-CM | POA: Diagnosis not present

## 2022-03-26 DIAGNOSIS — N186 End stage renal disease: Secondary | ICD-10-CM | POA: Diagnosis not present

## 2022-03-26 DIAGNOSIS — N2581 Secondary hyperparathyroidism of renal origin: Secondary | ICD-10-CM | POA: Diagnosis not present

## 2022-03-28 ENCOUNTER — Encounter: Payer: Self-pay | Admitting: Nurse Practitioner

## 2022-03-29 DIAGNOSIS — N186 End stage renal disease: Secondary | ICD-10-CM | POA: Diagnosis not present

## 2022-03-29 DIAGNOSIS — D509 Iron deficiency anemia, unspecified: Secondary | ICD-10-CM | POA: Diagnosis not present

## 2022-03-29 DIAGNOSIS — D631 Anemia in chronic kidney disease: Secondary | ICD-10-CM | POA: Diagnosis not present

## 2022-03-29 DIAGNOSIS — N2581 Secondary hyperparathyroidism of renal origin: Secondary | ICD-10-CM | POA: Diagnosis not present

## 2022-03-29 DIAGNOSIS — Z992 Dependence on renal dialysis: Secondary | ICD-10-CM | POA: Diagnosis not present

## 2022-03-31 DIAGNOSIS — D631 Anemia in chronic kidney disease: Secondary | ICD-10-CM | POA: Diagnosis not present

## 2022-03-31 DIAGNOSIS — D509 Iron deficiency anemia, unspecified: Secondary | ICD-10-CM | POA: Diagnosis not present

## 2022-03-31 DIAGNOSIS — N186 End stage renal disease: Secondary | ICD-10-CM | POA: Diagnosis not present

## 2022-03-31 DIAGNOSIS — Z992 Dependence on renal dialysis: Secondary | ICD-10-CM | POA: Diagnosis not present

## 2022-03-31 DIAGNOSIS — N2581 Secondary hyperparathyroidism of renal origin: Secondary | ICD-10-CM | POA: Diagnosis not present

## 2022-04-02 DIAGNOSIS — N186 End stage renal disease: Secondary | ICD-10-CM | POA: Diagnosis not present

## 2022-04-02 DIAGNOSIS — N2581 Secondary hyperparathyroidism of renal origin: Secondary | ICD-10-CM | POA: Diagnosis not present

## 2022-04-02 DIAGNOSIS — Z992 Dependence on renal dialysis: Secondary | ICD-10-CM | POA: Diagnosis not present

## 2022-04-02 DIAGNOSIS — D509 Iron deficiency anemia, unspecified: Secondary | ICD-10-CM | POA: Diagnosis not present

## 2022-04-02 DIAGNOSIS — D631 Anemia in chronic kidney disease: Secondary | ICD-10-CM | POA: Diagnosis not present

## 2022-04-05 DIAGNOSIS — N186 End stage renal disease: Secondary | ICD-10-CM | POA: Diagnosis not present

## 2022-04-05 DIAGNOSIS — D509 Iron deficiency anemia, unspecified: Secondary | ICD-10-CM | POA: Diagnosis not present

## 2022-04-05 DIAGNOSIS — Z992 Dependence on renal dialysis: Secondary | ICD-10-CM | POA: Diagnosis not present

## 2022-04-05 DIAGNOSIS — D631 Anemia in chronic kidney disease: Secondary | ICD-10-CM | POA: Diagnosis not present

## 2022-04-05 DIAGNOSIS — N2581 Secondary hyperparathyroidism of renal origin: Secondary | ICD-10-CM | POA: Diagnosis not present

## 2022-04-06 DIAGNOSIS — I5022 Chronic systolic (congestive) heart failure: Secondary | ICD-10-CM | POA: Diagnosis not present

## 2022-04-06 DIAGNOSIS — R072 Precordial pain: Secondary | ICD-10-CM | POA: Diagnosis not present

## 2022-04-06 DIAGNOSIS — N186 End stage renal disease: Secondary | ICD-10-CM | POA: Diagnosis not present

## 2022-04-06 DIAGNOSIS — I2510 Atherosclerotic heart disease of native coronary artery without angina pectoris: Secondary | ICD-10-CM | POA: Diagnosis not present

## 2022-04-07 DIAGNOSIS — N2581 Secondary hyperparathyroidism of renal origin: Secondary | ICD-10-CM | POA: Diagnosis not present

## 2022-04-07 DIAGNOSIS — Z992 Dependence on renal dialysis: Secondary | ICD-10-CM | POA: Diagnosis not present

## 2022-04-07 DIAGNOSIS — D509 Iron deficiency anemia, unspecified: Secondary | ICD-10-CM | POA: Diagnosis not present

## 2022-04-07 DIAGNOSIS — D631 Anemia in chronic kidney disease: Secondary | ICD-10-CM | POA: Diagnosis not present

## 2022-04-07 DIAGNOSIS — N186 End stage renal disease: Secondary | ICD-10-CM | POA: Diagnosis not present

## 2022-04-09 DIAGNOSIS — Z992 Dependence on renal dialysis: Secondary | ICD-10-CM | POA: Diagnosis not present

## 2022-04-09 DIAGNOSIS — D631 Anemia in chronic kidney disease: Secondary | ICD-10-CM | POA: Diagnosis not present

## 2022-04-09 DIAGNOSIS — D509 Iron deficiency anemia, unspecified: Secondary | ICD-10-CM | POA: Diagnosis not present

## 2022-04-09 DIAGNOSIS — N186 End stage renal disease: Secondary | ICD-10-CM | POA: Diagnosis not present

## 2022-04-09 DIAGNOSIS — N2581 Secondary hyperparathyroidism of renal origin: Secondary | ICD-10-CM | POA: Diagnosis not present

## 2022-04-11 DIAGNOSIS — Z992 Dependence on renal dialysis: Secondary | ICD-10-CM | POA: Diagnosis not present

## 2022-04-11 DIAGNOSIS — D631 Anemia in chronic kidney disease: Secondary | ICD-10-CM | POA: Diagnosis not present

## 2022-04-11 DIAGNOSIS — N186 End stage renal disease: Secondary | ICD-10-CM | POA: Diagnosis not present

## 2022-04-11 DIAGNOSIS — N2581 Secondary hyperparathyroidism of renal origin: Secondary | ICD-10-CM | POA: Diagnosis not present

## 2022-04-11 DIAGNOSIS — D509 Iron deficiency anemia, unspecified: Secondary | ICD-10-CM | POA: Diagnosis not present

## 2022-04-13 DIAGNOSIS — N2581 Secondary hyperparathyroidism of renal origin: Secondary | ICD-10-CM | POA: Diagnosis not present

## 2022-04-13 DIAGNOSIS — D509 Iron deficiency anemia, unspecified: Secondary | ICD-10-CM | POA: Diagnosis not present

## 2022-04-13 DIAGNOSIS — D631 Anemia in chronic kidney disease: Secondary | ICD-10-CM | POA: Diagnosis not present

## 2022-04-13 DIAGNOSIS — N186 End stage renal disease: Secondary | ICD-10-CM | POA: Diagnosis not present

## 2022-04-13 DIAGNOSIS — Z992 Dependence on renal dialysis: Secondary | ICD-10-CM | POA: Diagnosis not present

## 2022-04-16 DIAGNOSIS — N2581 Secondary hyperparathyroidism of renal origin: Secondary | ICD-10-CM | POA: Diagnosis not present

## 2022-04-16 DIAGNOSIS — Z992 Dependence on renal dialysis: Secondary | ICD-10-CM | POA: Diagnosis not present

## 2022-04-16 DIAGNOSIS — D509 Iron deficiency anemia, unspecified: Secondary | ICD-10-CM | POA: Diagnosis not present

## 2022-04-16 DIAGNOSIS — D631 Anemia in chronic kidney disease: Secondary | ICD-10-CM | POA: Diagnosis not present

## 2022-04-16 DIAGNOSIS — N186 End stage renal disease: Secondary | ICD-10-CM | POA: Diagnosis not present

## 2022-04-19 DIAGNOSIS — D509 Iron deficiency anemia, unspecified: Secondary | ICD-10-CM | POA: Diagnosis not present

## 2022-04-19 DIAGNOSIS — Z992 Dependence on renal dialysis: Secondary | ICD-10-CM | POA: Diagnosis not present

## 2022-04-19 DIAGNOSIS — D631 Anemia in chronic kidney disease: Secondary | ICD-10-CM | POA: Diagnosis not present

## 2022-04-19 DIAGNOSIS — N2581 Secondary hyperparathyroidism of renal origin: Secondary | ICD-10-CM | POA: Diagnosis not present

## 2022-04-19 DIAGNOSIS — N186 End stage renal disease: Secondary | ICD-10-CM | POA: Diagnosis not present

## 2022-04-21 DIAGNOSIS — I2510 Atherosclerotic heart disease of native coronary artery without angina pectoris: Secondary | ICD-10-CM | POA: Diagnosis not present

## 2022-04-21 DIAGNOSIS — Z992 Dependence on renal dialysis: Secondary | ICD-10-CM | POA: Diagnosis not present

## 2022-04-21 DIAGNOSIS — I5022 Chronic systolic (congestive) heart failure: Secondary | ICD-10-CM | POA: Diagnosis not present

## 2022-04-21 DIAGNOSIS — D509 Iron deficiency anemia, unspecified: Secondary | ICD-10-CM | POA: Diagnosis not present

## 2022-04-21 DIAGNOSIS — D631 Anemia in chronic kidney disease: Secondary | ICD-10-CM | POA: Diagnosis not present

## 2022-04-21 DIAGNOSIS — N2581 Secondary hyperparathyroidism of renal origin: Secondary | ICD-10-CM | POA: Diagnosis not present

## 2022-04-21 DIAGNOSIS — R072 Precordial pain: Secondary | ICD-10-CM | POA: Diagnosis not present

## 2022-04-21 DIAGNOSIS — N186 End stage renal disease: Secondary | ICD-10-CM | POA: Diagnosis not present

## 2022-04-23 DIAGNOSIS — N2581 Secondary hyperparathyroidism of renal origin: Secondary | ICD-10-CM | POA: Diagnosis not present

## 2022-04-23 DIAGNOSIS — D631 Anemia in chronic kidney disease: Secondary | ICD-10-CM | POA: Diagnosis not present

## 2022-04-23 DIAGNOSIS — N186 End stage renal disease: Secondary | ICD-10-CM | POA: Diagnosis not present

## 2022-04-23 DIAGNOSIS — I120 Hypertensive chronic kidney disease with stage 5 chronic kidney disease or end stage renal disease: Secondary | ICD-10-CM | POA: Diagnosis not present

## 2022-04-23 DIAGNOSIS — D509 Iron deficiency anemia, unspecified: Secondary | ICD-10-CM | POA: Diagnosis not present

## 2022-04-23 DIAGNOSIS — Z992 Dependence on renal dialysis: Secondary | ICD-10-CM | POA: Diagnosis not present

## 2022-04-26 DIAGNOSIS — D509 Iron deficiency anemia, unspecified: Secondary | ICD-10-CM | POA: Diagnosis not present

## 2022-04-26 DIAGNOSIS — Z992 Dependence on renal dialysis: Secondary | ICD-10-CM | POA: Diagnosis not present

## 2022-04-26 DIAGNOSIS — D631 Anemia in chronic kidney disease: Secondary | ICD-10-CM | POA: Diagnosis not present

## 2022-04-26 DIAGNOSIS — N186 End stage renal disease: Secondary | ICD-10-CM | POA: Diagnosis not present

## 2022-04-26 DIAGNOSIS — N2581 Secondary hyperparathyroidism of renal origin: Secondary | ICD-10-CM | POA: Diagnosis not present

## 2022-04-27 ENCOUNTER — Ambulatory Visit (INDEPENDENT_AMBULATORY_CARE_PROVIDER_SITE_OTHER): Payer: Medicare Other | Admitting: Podiatry

## 2022-04-27 ENCOUNTER — Encounter: Payer: Self-pay | Admitting: Podiatry

## 2022-04-27 VITALS — BP 112/72

## 2022-04-27 DIAGNOSIS — M79675 Pain in left toe(s): Secondary | ICD-10-CM

## 2022-04-27 DIAGNOSIS — N186 End stage renal disease: Secondary | ICD-10-CM

## 2022-04-27 DIAGNOSIS — B351 Tinea unguium: Secondary | ICD-10-CM | POA: Diagnosis not present

## 2022-04-27 DIAGNOSIS — E119 Type 2 diabetes mellitus without complications: Secondary | ICD-10-CM | POA: Diagnosis not present

## 2022-04-27 DIAGNOSIS — Z7901 Long term (current) use of anticoagulants: Secondary | ICD-10-CM

## 2022-04-27 DIAGNOSIS — M79674 Pain in right toe(s): Secondary | ICD-10-CM | POA: Diagnosis not present

## 2022-04-27 NOTE — Progress Notes (Signed)
This patient returns to my office for at risk foot care.  This patient requires this care by a professional since this patient will be at risk due to having DM, chronic anticoagulation and ESRD.  This patient is unable to cut nails himself since the patient cannot reach his nails.These nails are painful walking and wearing shoes.  This patient presents for at risk foot care today.  General Appearance  Alert, conversant and in no acute stress.  Vascular  Dorsalis pedis and posterior tibial  pulses are palpable  bilaterally.  Capillary return is within normal limits  bilaterally. Temperature is within normal limits  bilaterally.  Neurologic  Senn-Weinstein monofilament wire test within normal limits  bilaterally. Muscle power within normal limits bilaterally.  Nails Thick disfigured discolored nails with subungual debris  from hallux to fifth toes bilaterally. No evidence of bacterial infection or drainage bilaterally.  Orthopedic  No limitations of motion  feet .  No crepitus or effusions noted.  No bony pathology or digital deformities noted.  Skin  normotropic skin with no porokeratosis noted bilaterally.  No signs of infections or ulcers noted.     Onychomycosis  Pain in right toes  Pain in left toes  Consent was obtained for treatment procedures.   Mechanical debridement of nails 1-5  bilaterally performed with a nail nipper.  Filed with dremel without incident.    Return office visit     4   months                 Told patient to return for periodic foot care and evaluation due to potential at risk complications.   Gardiner Barefoot DPM

## 2022-04-28 DIAGNOSIS — D631 Anemia in chronic kidney disease: Secondary | ICD-10-CM | POA: Diagnosis not present

## 2022-04-28 DIAGNOSIS — N2581 Secondary hyperparathyroidism of renal origin: Secondary | ICD-10-CM | POA: Diagnosis not present

## 2022-04-28 DIAGNOSIS — N186 End stage renal disease: Secondary | ICD-10-CM | POA: Diagnosis not present

## 2022-04-28 DIAGNOSIS — I69354 Hemiplegia and hemiparesis following cerebral infarction affecting left non-dominant side: Secondary | ICD-10-CM | POA: Diagnosis not present

## 2022-04-28 DIAGNOSIS — Z992 Dependence on renal dialysis: Secondary | ICD-10-CM | POA: Diagnosis not present

## 2022-04-28 DIAGNOSIS — G8929 Other chronic pain: Secondary | ICD-10-CM | POA: Diagnosis not present

## 2022-04-28 DIAGNOSIS — D509 Iron deficiency anemia, unspecified: Secondary | ICD-10-CM | POA: Diagnosis not present

## 2022-04-28 DIAGNOSIS — E1122 Type 2 diabetes mellitus with diabetic chronic kidney disease: Secondary | ICD-10-CM | POA: Diagnosis not present

## 2022-04-30 DIAGNOSIS — Z992 Dependence on renal dialysis: Secondary | ICD-10-CM | POA: Diagnosis not present

## 2022-04-30 DIAGNOSIS — N186 End stage renal disease: Secondary | ICD-10-CM | POA: Diagnosis not present

## 2022-04-30 DIAGNOSIS — D509 Iron deficiency anemia, unspecified: Secondary | ICD-10-CM | POA: Diagnosis not present

## 2022-04-30 DIAGNOSIS — N2581 Secondary hyperparathyroidism of renal origin: Secondary | ICD-10-CM | POA: Diagnosis not present

## 2022-04-30 DIAGNOSIS — D631 Anemia in chronic kidney disease: Secondary | ICD-10-CM | POA: Diagnosis not present

## 2022-05-03 DIAGNOSIS — Z992 Dependence on renal dialysis: Secondary | ICD-10-CM | POA: Diagnosis not present

## 2022-05-03 DIAGNOSIS — N2581 Secondary hyperparathyroidism of renal origin: Secondary | ICD-10-CM | POA: Diagnosis not present

## 2022-05-03 DIAGNOSIS — D509 Iron deficiency anemia, unspecified: Secondary | ICD-10-CM | POA: Diagnosis not present

## 2022-05-03 DIAGNOSIS — D631 Anemia in chronic kidney disease: Secondary | ICD-10-CM | POA: Diagnosis not present

## 2022-05-03 DIAGNOSIS — N186 End stage renal disease: Secondary | ICD-10-CM | POA: Diagnosis not present

## 2022-05-05 DIAGNOSIS — N2581 Secondary hyperparathyroidism of renal origin: Secondary | ICD-10-CM | POA: Diagnosis not present

## 2022-05-05 DIAGNOSIS — D509 Iron deficiency anemia, unspecified: Secondary | ICD-10-CM | POA: Diagnosis not present

## 2022-05-05 DIAGNOSIS — Z992 Dependence on renal dialysis: Secondary | ICD-10-CM | POA: Diagnosis not present

## 2022-05-05 DIAGNOSIS — N186 End stage renal disease: Secondary | ICD-10-CM | POA: Diagnosis not present

## 2022-05-05 DIAGNOSIS — D631 Anemia in chronic kidney disease: Secondary | ICD-10-CM | POA: Diagnosis not present

## 2022-05-07 DIAGNOSIS — D509 Iron deficiency anemia, unspecified: Secondary | ICD-10-CM | POA: Diagnosis not present

## 2022-05-07 DIAGNOSIS — N186 End stage renal disease: Secondary | ICD-10-CM | POA: Diagnosis not present

## 2022-05-07 DIAGNOSIS — D631 Anemia in chronic kidney disease: Secondary | ICD-10-CM | POA: Diagnosis not present

## 2022-05-07 DIAGNOSIS — N2581 Secondary hyperparathyroidism of renal origin: Secondary | ICD-10-CM | POA: Diagnosis not present

## 2022-05-07 DIAGNOSIS — Z992 Dependence on renal dialysis: Secondary | ICD-10-CM | POA: Diagnosis not present

## 2022-05-10 ENCOUNTER — Encounter: Payer: Self-pay | Admitting: Nurse Practitioner

## 2022-05-10 DIAGNOSIS — D631 Anemia in chronic kidney disease: Secondary | ICD-10-CM | POA: Diagnosis not present

## 2022-05-10 DIAGNOSIS — D509 Iron deficiency anemia, unspecified: Secondary | ICD-10-CM | POA: Diagnosis not present

## 2022-05-10 DIAGNOSIS — Z992 Dependence on renal dialysis: Secondary | ICD-10-CM | POA: Diagnosis not present

## 2022-05-10 DIAGNOSIS — N2581 Secondary hyperparathyroidism of renal origin: Secondary | ICD-10-CM | POA: Diagnosis not present

## 2022-05-10 DIAGNOSIS — N186 End stage renal disease: Secondary | ICD-10-CM | POA: Diagnosis not present

## 2022-05-11 DIAGNOSIS — I2510 Atherosclerotic heart disease of native coronary artery without angina pectoris: Secondary | ICD-10-CM | POA: Diagnosis not present

## 2022-05-11 DIAGNOSIS — N186 End stage renal disease: Secondary | ICD-10-CM | POA: Diagnosis not present

## 2022-05-11 DIAGNOSIS — R072 Precordial pain: Secondary | ICD-10-CM | POA: Diagnosis not present

## 2022-05-11 DIAGNOSIS — I5022 Chronic systolic (congestive) heart failure: Secondary | ICD-10-CM | POA: Diagnosis not present

## 2022-05-12 DIAGNOSIS — D509 Iron deficiency anemia, unspecified: Secondary | ICD-10-CM | POA: Diagnosis not present

## 2022-05-12 DIAGNOSIS — D631 Anemia in chronic kidney disease: Secondary | ICD-10-CM | POA: Diagnosis not present

## 2022-05-12 DIAGNOSIS — N2581 Secondary hyperparathyroidism of renal origin: Secondary | ICD-10-CM | POA: Diagnosis not present

## 2022-05-12 DIAGNOSIS — Z992 Dependence on renal dialysis: Secondary | ICD-10-CM | POA: Diagnosis not present

## 2022-05-12 DIAGNOSIS — N186 End stage renal disease: Secondary | ICD-10-CM | POA: Diagnosis not present

## 2022-05-14 DIAGNOSIS — D631 Anemia in chronic kidney disease: Secondary | ICD-10-CM | POA: Diagnosis not present

## 2022-05-14 DIAGNOSIS — Z992 Dependence on renal dialysis: Secondary | ICD-10-CM | POA: Diagnosis not present

## 2022-05-14 DIAGNOSIS — N186 End stage renal disease: Secondary | ICD-10-CM | POA: Diagnosis not present

## 2022-05-14 DIAGNOSIS — D509 Iron deficiency anemia, unspecified: Secondary | ICD-10-CM | POA: Diagnosis not present

## 2022-05-14 DIAGNOSIS — N2581 Secondary hyperparathyroidism of renal origin: Secondary | ICD-10-CM | POA: Diagnosis not present

## 2022-05-16 DIAGNOSIS — N2581 Secondary hyperparathyroidism of renal origin: Secondary | ICD-10-CM | POA: Diagnosis not present

## 2022-05-16 DIAGNOSIS — D509 Iron deficiency anemia, unspecified: Secondary | ICD-10-CM | POA: Diagnosis not present

## 2022-05-16 DIAGNOSIS — Z992 Dependence on renal dialysis: Secondary | ICD-10-CM | POA: Diagnosis not present

## 2022-05-16 DIAGNOSIS — D631 Anemia in chronic kidney disease: Secondary | ICD-10-CM | POA: Diagnosis not present

## 2022-05-16 DIAGNOSIS — N186 End stage renal disease: Secondary | ICD-10-CM | POA: Diagnosis not present

## 2022-05-19 DIAGNOSIS — D631 Anemia in chronic kidney disease: Secondary | ICD-10-CM | POA: Diagnosis not present

## 2022-05-19 DIAGNOSIS — D509 Iron deficiency anemia, unspecified: Secondary | ICD-10-CM | POA: Diagnosis not present

## 2022-05-19 DIAGNOSIS — Z992 Dependence on renal dialysis: Secondary | ICD-10-CM | POA: Diagnosis not present

## 2022-05-19 DIAGNOSIS — N186 End stage renal disease: Secondary | ICD-10-CM | POA: Diagnosis not present

## 2022-05-19 DIAGNOSIS — N2581 Secondary hyperparathyroidism of renal origin: Secondary | ICD-10-CM | POA: Diagnosis not present

## 2022-05-21 ENCOUNTER — Other Ambulatory Visit: Payer: Self-pay | Admitting: Nurse Practitioner

## 2022-05-21 DIAGNOSIS — D509 Iron deficiency anemia, unspecified: Secondary | ICD-10-CM | POA: Diagnosis not present

## 2022-05-21 DIAGNOSIS — R053 Chronic cough: Secondary | ICD-10-CM

## 2022-05-21 DIAGNOSIS — N186 End stage renal disease: Secondary | ICD-10-CM | POA: Diagnosis not present

## 2022-05-21 DIAGNOSIS — D631 Anemia in chronic kidney disease: Secondary | ICD-10-CM | POA: Diagnosis not present

## 2022-05-21 DIAGNOSIS — N2581 Secondary hyperparathyroidism of renal origin: Secondary | ICD-10-CM | POA: Diagnosis not present

## 2022-05-21 DIAGNOSIS — Z992 Dependence on renal dialysis: Secondary | ICD-10-CM | POA: Diagnosis not present

## 2022-05-23 DIAGNOSIS — Z992 Dependence on renal dialysis: Secondary | ICD-10-CM | POA: Diagnosis not present

## 2022-05-23 DIAGNOSIS — N186 End stage renal disease: Secondary | ICD-10-CM | POA: Diagnosis not present

## 2022-05-23 DIAGNOSIS — D631 Anemia in chronic kidney disease: Secondary | ICD-10-CM | POA: Diagnosis not present

## 2022-05-23 DIAGNOSIS — D509 Iron deficiency anemia, unspecified: Secondary | ICD-10-CM | POA: Diagnosis not present

## 2022-05-23 DIAGNOSIS — N2581 Secondary hyperparathyroidism of renal origin: Secondary | ICD-10-CM | POA: Diagnosis not present

## 2022-05-24 ENCOUNTER — Encounter (HOSPITAL_COMMUNITY): Payer: Self-pay | Admitting: Emergency Medicine

## 2022-05-24 ENCOUNTER — Emergency Department (HOSPITAL_COMMUNITY)
Admission: EM | Admit: 2022-05-24 | Discharge: 2022-05-25 | Disposition: A | Discharge status: Home / Self Care | Payer: Medicare Other | Attending: Emergency Medicine | Admitting: Emergency Medicine

## 2022-05-24 ENCOUNTER — Emergency Department (HOSPITAL_COMMUNITY): Payer: Medicare Other

## 2022-05-24 ENCOUNTER — Encounter (HOSPITAL_COMMUNITY): Payer: Self-pay | Admitting: *Deleted

## 2022-05-24 ENCOUNTER — Other Ambulatory Visit: Payer: Self-pay

## 2022-05-24 DIAGNOSIS — R1032 Left lower quadrant pain: Secondary | ICD-10-CM | POA: Insufficient documentation

## 2022-05-24 DIAGNOSIS — Z79899 Other long term (current) drug therapy: Secondary | ICD-10-CM | POA: Insufficient documentation

## 2022-05-24 DIAGNOSIS — E1122 Type 2 diabetes mellitus with diabetic chronic kidney disease: Secondary | ICD-10-CM | POA: Diagnosis not present

## 2022-05-24 DIAGNOSIS — K5730 Diverticulosis of large intestine without perforation or abscess without bleeding: Secondary | ICD-10-CM | POA: Diagnosis not present

## 2022-05-24 DIAGNOSIS — R079 Chest pain, unspecified: Secondary | ICD-10-CM | POA: Diagnosis not present

## 2022-05-24 DIAGNOSIS — Z1152 Encounter for screening for COVID-19: Secondary | ICD-10-CM | POA: Insufficient documentation

## 2022-05-24 DIAGNOSIS — I132 Hypertensive heart and chronic kidney disease with heart failure and with stage 5 chronic kidney disease, or end stage renal disease: Secondary | ICD-10-CM | POA: Diagnosis not present

## 2022-05-24 DIAGNOSIS — I509 Heart failure, unspecified: Secondary | ICD-10-CM | POA: Insufficient documentation

## 2022-05-24 DIAGNOSIS — N261 Atrophy of kidney (terminal): Secondary | ICD-10-CM | POA: Diagnosis not present

## 2022-05-24 DIAGNOSIS — R0602 Shortness of breath: Secondary | ICD-10-CM | POA: Insufficient documentation

## 2022-05-24 DIAGNOSIS — R0789 Other chest pain: Secondary | ICD-10-CM | POA: Diagnosis not present

## 2022-05-24 DIAGNOSIS — I517 Cardiomegaly: Secondary | ICD-10-CM | POA: Diagnosis not present

## 2022-05-24 DIAGNOSIS — I700 Atherosclerosis of aorta: Secondary | ICD-10-CM | POA: Diagnosis not present

## 2022-05-24 DIAGNOSIS — K5792 Diverticulitis of intestine, part unspecified, without perforation or abscess without bleeding: Secondary | ICD-10-CM

## 2022-05-24 DIAGNOSIS — R5383 Other fatigue: Secondary | ICD-10-CM | POA: Insufficient documentation

## 2022-05-24 DIAGNOSIS — Z992 Dependence on renal dialysis: Secondary | ICD-10-CM | POA: Insufficient documentation

## 2022-05-24 DIAGNOSIS — Z7901 Long term (current) use of anticoagulants: Secondary | ICD-10-CM | POA: Diagnosis not present

## 2022-05-24 DIAGNOSIS — K6389 Other specified diseases of intestine: Secondary | ICD-10-CM | POA: Diagnosis not present

## 2022-05-24 DIAGNOSIS — Z9049 Acquired absence of other specified parts of digestive tract: Secondary | ICD-10-CM | POA: Diagnosis not present

## 2022-05-24 DIAGNOSIS — R069 Unspecified abnormalities of breathing: Secondary | ICD-10-CM | POA: Diagnosis not present

## 2022-05-24 DIAGNOSIS — N186 End stage renal disease: Secondary | ICD-10-CM | POA: Insufficient documentation

## 2022-05-24 DIAGNOSIS — I120 Hypertensive chronic kidney disease with stage 5 chronic kidney disease or end stage renal disease: Secondary | ICD-10-CM | POA: Diagnosis not present

## 2022-05-24 DIAGNOSIS — R1012 Left upper quadrant pain: Secondary | ICD-10-CM | POA: Insufficient documentation

## 2022-05-24 DIAGNOSIS — I2510 Atherosclerotic heart disease of native coronary artery without angina pectoris: Secondary | ICD-10-CM | POA: Diagnosis not present

## 2022-05-24 DIAGNOSIS — K429 Umbilical hernia without obstruction or gangrene: Secondary | ICD-10-CM | POA: Diagnosis not present

## 2022-05-24 DIAGNOSIS — R531 Weakness: Secondary | ICD-10-CM | POA: Diagnosis not present

## 2022-05-24 DIAGNOSIS — J90 Pleural effusion, not elsewhere classified: Secondary | ICD-10-CM | POA: Diagnosis not present

## 2022-05-24 LAB — PROTIME-INR
INR: 9.6 (ref 0.8–1.2)
Prothrombin Time: 76.3 seconds — ABNORMAL HIGH (ref 11.4–15.2)

## 2022-05-24 LAB — LACTIC ACID, PLASMA: Lactic Acid, Venous: 1.1 mmol/L (ref 0.5–1.9)

## 2022-05-24 LAB — APTT: aPTT: 141 seconds — ABNORMAL HIGH (ref 24–36)

## 2022-05-24 MED ORDER — IOHEXOL 350 MG/ML SOLN
75.0000 mL | Freq: Once | INTRAVENOUS | Status: AC | PRN
  Administered 2022-05-24: 75 mL via INTRAVENOUS

## 2022-05-24 NOTE — ED Notes (Signed)
Patient transported to CT 

## 2022-05-24 NOTE — ED Provider Notes (Signed)
[facility] EMERGENCY DEPARTMENT Provider Note   CSN: [id number] Arrival date & time: 05/24/22  2232     History  Chief Complaint  Patient presents with  . Chest Pain  . Fatigue    [name] is a 62 y.o. male.  Patient is a 62 year old male with extensive past medical history including end-stage renal disease on hemodialysis, hypertension, type 2 diabetes, CHF, atrial flutter.  Patient presenting today with complaints of weakness and sluggishness for the past several days.  He has had several episodes of vomiting and chest pain.  He feels somewhat short of breath, but denies fevers, chills, or cough.  Patient goes for dialysis Monday Wednesday and Friday, but was dialyzed yesterday (Sunday) due to the holiday.  Only complaints otherwise are of pain to the left side of the abdomen and chest.  There are no aggravating or alleviating factors.  Patient found to be hypotensive upon arrival.  He tells me he normally runs a low blood pressure.  The history is provided by the patient.       Home Medications Prior to Admission medications   Medication Sig Start Date End Date Taking? Authorizing Provider  albuterol (VENTOLIN HFA) 108 (90 Base) MCG/ACT inhaler Inhale 2 puffs into the lungs every 6 (six) hours as needed for wheezing or shortness of breath. 09/18/19   [name], FNP  calcium acetate (PHOSLO) 667 MG capsule Take 1-3 capsules (667-2,001 mg total) by mouth See admin instructions. Take 2001 mg capsules with meals three times daily and 667 mg capsule with snacks. Patient taking differently: Take 2,001 mg by mouth daily. 11/23/18   Dixie Dials, MD  chlorhexidine (PERIDEX) 0.12 % solution 15 mLs by Mouth Rinse route 2 (two) times daily. 05/29/18   [provider]  cinacalcet (SENSIPAR) 90 MG tablet Take 180 mg by mouth daily.    [provider]  Control Gel Formula Dressing (DUODERM CGF EXTRA THIN) MISC Apply 1 each topically daily as needed.  10/26/20   [name], FNP  diclofenac Sodium (VOLTAREN) 1 % GEL Apply 2 g topically 4 (four) times daily. 11/15/19   [name], FNP  digoxin (LANOXIN) 0.125 MG tablet Take 1 tablet (0.125 mg total) by mouth every Monday, Wednesday, and Friday. 06/09/20   Rai, Vernelle Emerald, MD  ferric citrate (AURYXIA) 1 GM 210 MG(Fe) tablet Take 630 mg by mouth 3 (three) times daily with meals.    [provider]  fluticasone furoate-vilanterol (BREO ELLIPTA) 100-25 MCG/ACT AEPB Inhale 1 puff into the lungs daily. 05/05/21   Hunsucker, Bonna Gains, MD  gabapentin (NEURONTIN) 300 MG capsule Take 1 capsule (300 mg total) by mouth 3 (three) times daily. 11/10/21   [name], FNP  glucose blood test strip Use to check blood sugar 3 times a day. Dx code e11.65 10/13/21   [name], FNP  HYDROcodone bit-homatropine (HYDROMET) 5-1.5 MG/5ML syrup Take 5 mLs by mouth every 6 (six) hours as needed for cough. Patient not taking: Reported on 03/16/2022 12/10/20   [name], FNP  ipratropium (ATROVENT) 0.06 % nasal spray Place 2 sprays into both nostrils 4 (four) times daily. Patient taking differently: Place 2 sprays into both nostrils 4 (four) times daily as needed for rhinitis. 11/15/19   [name], FNP  isosorbide dinitrate (ISORDIL) 10 MG tablet Take 10 mg by mouth 2 (two) times daily. 07/07/21   [provider]  Lactobacillus (ACIDOPHILUS/BIFIDUS PO) Take 1 capsule by mouth daily. Written by Dr. Collene Mares to  take once per day.    [provider]  Lancets [facility] DELICA PLUS [id number]) MISC Use to check blood sugar 3 times a day. Dx code e11.65 10/13/21   [name], FNP  lidocaine-prilocaine (EMLA) cream Apply 1 application topically See admin instructions. Apply topically to port access prior to dialysis - Monday, Wednesday, Friday. 04/08/15   [provider]  loperamide (IMODIUM A-D) 2 MG tablet Take 1 tablet (2 mg total) by mouth 4 (four) times daily as needed for diarrhea or  loose stools. Also available OTC. 06/09/20   Rai, Vernelle Emerald, MD  meclizine (ANTIVERT) 25 MG tablet Take 1 tablet (25 mg total) by mouth 3 (three) times daily as needed for dizziness. 04/08/21   Horton, Alvin Critchley, DO  metoprolol succinate (TOPROL-XL) 25 MG 24 hr tablet Take 1 tablet (25 mg total) by mouth daily. 06/09/20   Rai, Vernelle Emerald, MD  metoprolol tartrate (LOPRESSOR) 25 MG tablet Take 25 mg by mouth 2 (two) times daily. Patient not taking: Reported on 03/16/2022 10/03/20   [provider]  midodrine (PROAMATINE) 10 MG tablet Take 1 tablet (10 mg total) by mouth 3 (three) times daily. 06/09/20   Rai, Vernelle Emerald, MD  multivitamin (RENA-VIT) TABS tablet Take 1 tablet by mouth at bedtime.    [provider]  nitroGLYCERIN (NITROSTAT) 0.4 MG SL tablet Place 0.4 mg under the tongue every 5 (five) minutes as needed for chest pain.  05/20/15   [provider]  Nutritional Supplements (FEEDING SUPPLEMENT, NEPRO CARB STEADY,) LIQD Take 237 mLs by mouth daily. 03/22/16   [provider]  ondansetron (ZOFRAN ODT) 4 MG disintegrating tablet Take 1 tablet (4 mg total) by mouth every 8 (eight) hours as needed for nausea or vomiting. 06/09/20   Rai, Vernelle Emerald, MD  oxyCODONE-acetaminophen (PERCOCET/ROXICET) 5-325 MG tablet Take 1 tablet by mouth every 4 (four) hours as needed for severe pain. 11/10/21   [name], FNP  pantoprazole (PROTONIX) 40 MG tablet Take 40 mg by mouth 2 (two) times daily.    [provider]  sucroferric oxyhydroxide (VELPHORO) 500 MG chewable tablet Chew 500-1,000 mg by mouth See admin instructions. Takes 2 tablets with each meal and 1 tablet with snacks. 10/02/18   [provider]  UNABLE TO FIND Out patient physical therapy- vestibular therapy for BPPV 08/01/18   Debbe Odea, MD  warfarin (COUMADIN) 7.5 MG tablet Take 7.5 mg by mouth daily.    [provider]      Allergies    Ace inhibitors    Review of Systems    Review of Systems  All other systems reviewed and are negative.   Physical Exam Updated Vital Signs BP (!) 68/56   Pulse 83   Temp 97.8 F (36.6 C) (Oral)   Resp 16   Ht '5\' 8"'$  (1.727 m)   Wt 107.9 kg   SpO2 100%   BMI 36.17 kg/m  Physical Exam Vitals and nursing note reviewed.  Constitutional:      General: He is not in acute distress.    Appearance: He is well-developed. He is not diaphoretic.  HENT:     Head: Normocephalic and atraumatic.  Cardiovascular:     Rate and Rhythm: Normal rate and regular rhythm.     Heart sounds: No murmur heard.    No friction rub.  Pulmonary:     Effort: Pulmonary effort is normal. No respiratory distress.     Breath sounds: Normal breath sounds. No wheezing  or rales.  Abdominal:     General: Bowel sounds are normal. There is no distension.     Palpations: Abdomen is soft.     Tenderness: There is abdominal tenderness. There is no guarding or rebound.     Comments: There is tenderness to palpation of the left upper and left lower quadrant.  Musculoskeletal:        General: Normal range of motion.     Cervical back: Normal range of motion and neck supple.  Skin:    General: Skin is warm and dry.  Neurological:     Mental Status: He is alert and oriented to person, place, and time.     Coordination: Coordination normal.    ED Results / Procedures / Treatments   Labs (all labs ordered are listed, but only abnormal results are displayed) Labs Reviewed  CULTURE, BLOOD (ROUTINE X 2)  CULTURE, BLOOD (ROUTINE X 2)  URINE CULTURE  RESP PANEL BY RT-PCR (RSV, FLU A&B, COVID)  [id number]  LACTIC ACID, PLASMA  LACTIC ACID, PLASMA  COMPREHENSIVE METABOLIC PANEL  CBC WITH DIFFERENTIAL/PLATELET  PROTIME-INR  APTT  URINALYSIS, ROUTINE W REFLEX MICROSCOPIC  BRAIN NATRIURETIC PEPTIDE  TROPONIN I (HIGH SENSITIVITY)    EKG EKG Interpretation  Date/Time:  Monday May 24 2022 22:53:17 EST Ventricular Rate:  82 PR Interval:  170 QRS  Duration: 126 QT Interval:  425 QTC Calculation: 497 R Axis:   -26 Text Interpretation: Sinus rhythm LVH with IVCD and secondary repol abnrm Borderline prolonged QT interval Confirmed by Veryl Speak [id number]) on 05/24/2022 11:00:19 PM  Radiology DG Chest Port 1 View  Result Date: 05/24/2022 CLINICAL DATA:  Questionable sepsis - evaluate for abnormality EXAM: PORTABLE CHEST 1 VIEW COMPARISON:  Chest x-ray 04/08/2021 FINDINGS: The heart and mediastinal contours are unchanged. Aortic calcification. No focal consolidation. No pulmonary edema. Small left pleural effusion. No pneumothorax. No acute osseous abnormality. IMPRESSION: 1. Small left pleural effusion. Query empyema given flat silhouette. Consider CT chest with intravenous contrast if clinically indicated. 2. Cardiomegaly with underlying pericardial effusion not excluded. 3.  Aortic Atherosclerosis (ICD10-I70.0). Electronically Signed   By: Iven Finn M.D.   On: 05/24/2022 23:12    Procedures Procedures  {Document cardiac monitor, telemetry assessment procedure when appropriate:1}  Medications Ordered in ED Medications - No data to display  ED Course/ Medical Decision Making/ A&P                           Medical Decision Making Amount and/or Complexity of Data Reviewed Radiology: ordered.   ***  {Document critical care time when appropriate:1} {Document review of labs and clinical decision tools ie heart score, Chads2Vasc2 etc:1}  {Document your independent review of radiology images, and any outside records:1} {Document your discussion with family members, caretakers, and with consultants:1} {Document social determinants of health affecting pt's care:1} {Document your decision making why or why not admission, treatments were needed:1} Final Clinical Impression(s) / ED Diagnoses Final diagnoses:  None    Rx / DC Orders ED Discharge Orders     None

## 2022-05-24 NOTE — ED Notes (Signed)
Pt's family at bedside. Family stated pt's SBP is typically 90s or less especially after dialysis. EDP notified.

## 2022-05-24 NOTE — ED Provider Triage Note (Signed)
Emergency Medicine Provider Triage Evaluation Note  [name] , a 62 y.o. male  was evaluated in triage.  Pt complains of lethargy for the last several days, vomiting x 3 days and chest pain this evening.  Also endorses difficulty breathing, received home O2 when receiving dialysis.  Last session yesterday, has not missed a session.  Informed by ED tech that patient's systolic blood pressures are in the 60s in triage, patient lethargic, difficulty answering questions, increased work of breathing.  Review of Systems  Positive:  Negative:   Unable to obtain due to acuity of presentation on arrival. Physical Exam  There were no vitals taken for this visit. Gen:   Awake, ill-appearing Resp:  Normal effort, tachypneic MSK:   Moves extremities without difficulty  Other:  RRR no M/R/G.  Medical Decision Making  Medically screening exam initiated at 10:41 PM.  Appropriate orders placed.  [name] was informed that the remainder of the evaluation will be completed by another provider, this initial triage assessment does not replace that evaluation, and the importance of remaining in the ED until their evaluation is complete.  Hypotensive with systolic blood pressures in the 60s.  Charge RN Janett Billow made aware, patient transported emergently to trauma A.  This chart was dictated using voice recognition software, Dragon. Despite the best efforts of this provider to proofread and correct errors, errors may still occur which can change documentation meaning.    Emeline Darling, PA-C 05/24/22 2256

## 2022-05-24 NOTE — ED Triage Notes (Signed)
Per EMS, pt has left sided non radiating chest pain.  Pt did not take prescribed nitro.  BP 99/64 w/ EMS, wife states he runs low.  Pt states he "has been lethargic for several days."  Also c/o SOB and nausea and he did have some emesis yesterday.  Lung sounds diminished in Left lower quad.  90% RA (no home O2 except when receiving dialysis)  Dialysis usually on MWF however due to holidays had a full treatment yesterday.    112/88 HR 82 SR 99% on 2L 16RR CBG 160

## 2022-05-25 ENCOUNTER — Telehealth: Payer: Self-pay

## 2022-05-25 LAB — RESP PANEL BY RT-PCR (RSV, FLU A&B, COVID)  RVPGX2
Influenza A by PCR: NEGATIVE
Influenza B by PCR: NEGATIVE
Resp Syncytial Virus by PCR: NEGATIVE
SARS Coronavirus 2 by RT PCR: NEGATIVE

## 2022-05-25 LAB — COMPREHENSIVE METABOLIC PANEL
ALT: 5 U/L (ref 0–44)
ALT: 8 U/L (ref 0–44)
AST: 7 U/L — ABNORMAL LOW (ref 15–41)
AST: 9 U/L — ABNORMAL LOW (ref 15–41)
Albumin: 2.8 g/dL — ABNORMAL LOW (ref 3.5–5.0)
Albumin: <1.5 g/dL — ABNORMAL LOW (ref 3.5–5.0)
Alkaline Phosphatase: 48 U/L (ref 38–126)
Alkaline Phosphatase: 98 U/L (ref 38–126)
Anion gap: 16 — ABNORMAL HIGH (ref 5–15)
Anion gap: 8 (ref 5–15)
BUN: 21 mg/dL — ABNORMAL HIGH (ref 6–20)
BUN: 33 mg/dL — ABNORMAL HIGH (ref 6–20)
CO2: 12 mmol/L — ABNORMAL LOW (ref 22–32)
CO2: 24 mmol/L (ref 22–32)
Calcium: 10 mg/dL (ref 8.9–10.3)
Calcium: 4.8 mg/dL — CL (ref 8.9–10.3)
Chloride: 122 mmol/L — ABNORMAL HIGH (ref 98–111)
Chloride: 94 mmol/L — ABNORMAL LOW (ref 98–111)
Creatinine, Ser: 4.29 mg/dL — ABNORMAL HIGH (ref 0.61–1.24)
Creatinine, Ser: 8.92 mg/dL — ABNORMAL HIGH (ref 0.61–1.24)
GFR, Estimated: 15 mL/min — ABNORMAL LOW (ref >60)
GFR, Estimated: 6 mL/min — ABNORMAL LOW (ref >60)
Glucose, Bld: 52 mg/dL — ABNORMAL LOW (ref 70–99)
Glucose, Bld: 83 mg/dL (ref 70–99)
Potassium: 4.2 mmol/L (ref 3.5–5.1)
Potassium: <2 mmol/L — CL (ref 3.5–5.1)
Sodium: 134 mmol/L — ABNORMAL LOW (ref 135–145)
Sodium: 142 mmol/L (ref 135–145)
Total Bilirubin: 0.4 mg/dL (ref 0.3–1.2)
Total Bilirubin: 0.9 mg/dL (ref 0.3–1.2)
Total Protein: 3.2 g/dL — ABNORMAL LOW (ref 6.5–8.1)
Total Protein: 6.5 g/dL (ref 6.5–8.1)

## 2022-05-25 LAB — CBC WITH DIFFERENTIAL/PLATELET
Abs Immature Granulocytes: 0.03 10*3/uL (ref 0.00–0.07)
Abs Immature Granulocytes: 0.05 10*3/uL (ref 0.00–0.07)
Basophils Absolute: 0 10*3/uL (ref 0.0–0.1)
Basophils Absolute: 0 10*3/uL (ref 0.0–0.1)
Basophils Relative: 0 %
Basophils Relative: 0 %
Eosinophils Absolute: 0 10*3/uL (ref 0.0–0.5)
Eosinophils Absolute: 0.1 10*3/uL (ref 0.0–0.5)
Eosinophils Relative: 1 %
Eosinophils Relative: 1 %
HCT: 19.4 % — ABNORMAL LOW (ref 39.0–52.0)
HCT: 33.4 % — ABNORMAL LOW (ref 39.0–52.0)
Hemoglobin: 10.1 g/dL — ABNORMAL LOW (ref 13.0–17.0)
Hemoglobin: 5.5 g/dL — CL (ref 13.0–17.0)
Immature Granulocytes: 1 %
Immature Granulocytes: 1 %
Lymphocytes Relative: 11 %
Lymphocytes Relative: 13 %
Lymphs Abs: 0.7 10*3/uL (ref 0.7–4.0)
Lymphs Abs: 1.1 10*3/uL (ref 0.7–4.0)
MCH: 31.6 pg (ref 26.0–34.0)
MCH: 32 pg (ref 26.0–34.0)
MCHC: 28.4 g/dL — ABNORMAL LOW (ref 30.0–36.0)
MCHC: 30.2 g/dL (ref 30.0–36.0)
MCV: 105.7 fL — ABNORMAL HIGH (ref 80.0–100.0)
MCV: 111.5 fL — ABNORMAL HIGH (ref 80.0–100.0)
Monocytes Absolute: 0.5 10*3/uL (ref 0.1–1.0)
Monocytes Absolute: 0.9 10*3/uL (ref 0.1–1.0)
Monocytes Relative: 10 %
Monocytes Relative: 9 %
Neutro Abs: 4.4 10*3/uL (ref 1.7–7.7)
Neutro Abs: 7.4 10*3/uL (ref 1.7–7.7)
Neutrophils Relative %: 76 %
Neutrophils Relative %: 77 %
Platelets: 111 10*3/uL — ABNORMAL LOW (ref 150–400)
Platelets: 188 10*3/uL (ref 150–400)
RBC: 1.74 MIL/uL — ABNORMAL LOW (ref 4.22–5.81)
RBC: 3.16 MIL/uL — ABNORMAL LOW (ref 4.22–5.81)
RDW: 15.1 % (ref 11.5–15.5)
RDW: 15.2 % (ref 11.5–15.5)
WBC: 5.7 10*3/uL (ref 4.0–10.5)
WBC: 9.6 10*3/uL (ref 4.0–10.5)
nRBC: 0 % (ref 0.0–0.2)
nRBC: 0 % (ref 0.0–0.2)

## 2022-05-25 LAB — DIGOXIN LEVEL: Digoxin Level: <0.2 ng/mL — ABNORMAL LOW (ref 0.8–2.0)

## 2022-05-25 LAB — PROTIME-INR
INR: 5.4 (ref 0.8–1.2)
Prothrombin Time: 49.2 seconds — ABNORMAL HIGH (ref 11.4–15.2)

## 2022-05-25 LAB — BRAIN NATRIURETIC PEPTIDE: B Natriuretic Peptide: 86 pg/mL (ref 0.0–100.0)

## 2022-05-25 LAB — TROPONIN I (HIGH SENSITIVITY)
Troponin I (High Sensitivity): 3 ng/L (ref <18)
Troponin I (High Sensitivity): 6 ng/L (ref <18)

## 2022-05-25 MED ORDER — CIPROFLOXACIN HCL 500 MG PO TABS: 500.0000 mg | ORAL_TABLET | Freq: Two times a day (BID) | ORAL | 0 refills | Status: DC

## 2022-05-25 MED ORDER — METRONIDAZOLE 500 MG PO TABS: 500.0000 mg | ORAL_TABLET | Freq: Three times a day (TID) | ORAL | 0 refills | Status: DC

## 2022-05-25 MED ORDER — HYDROCODONE-ACETAMINOPHEN 5-325 MG PO TABS: 1.0000 | ORAL_TABLET | Freq: Four times a day (QID) | ORAL | 0 refills | Status: DC | PRN

## 2022-05-25 MED ORDER — METRONIDAZOLE 500 MG PO TABS
500.0000 mg | ORAL_TABLET | Freq: Once | ORAL | Status: AC
  Administered 2022-05-25: 500 mg via ORAL
  Filled 2022-05-25: qty 1

## 2022-05-25 MED ORDER — HYDROCODONE-ACETAMINOPHEN 5-325 MG PO TABS
2.0000 | ORAL_TABLET | Freq: Once | ORAL | Status: AC
  Administered 2022-05-25: 2 via ORAL
  Filled 2022-05-25: qty 2

## 2022-05-25 MED ORDER — CIPROFLOXACIN HCL 500 MG PO TABS
500.0000 mg | ORAL_TABLET | Freq: Once | ORAL | Status: AC
  Administered 2022-05-25: 500 mg via ORAL
  Filled 2022-05-25: qty 1

## 2022-05-25 NOTE — Discharge Instructions (Signed)
Begin taking Cipro and Flagyl as prescribed.  Begin taking hydrocodone as prescribed as needed for pain.  Follow-up with primary doctor if not improving in the next week, and return to the ER if you develop worsening pain, high fevers, bloody stools, or for other new and concerning symptoms.

## 2022-05-25 NOTE — ED Notes (Signed)
EDP [place] notified of critical labs.

## 2022-05-25 NOTE — Telephone Encounter (Signed)
Transition Care Management Follow-up Telephone Call Date of discharge and from where: pt wife states he is okay.  How have you been since you were released from the hospital? 05/25/2021 [place] Any questions or concerns? No  Items Reviewed: Did the pt receive and understand the discharge instructions provided? Yes  Medications obtained and verified? Yes  Other? Yes  Any new allergies since your discharge? No  Dietary orders reviewed? Yes Do you have support at home? Yes   Home Care and Equipment/Supplies: Were home health services ordered? no If so, what is the name of the agency? N/a  Has the agency set up a time to come to the patient's home? no Were any new equipment or medical supplies ordered?  No What is the name of the medical supply agency? N/a Were you able to get the supplies/equipment? no Do you have any questions related to the use of the equipment or supplies? No  Functional Questionnaire: (I = Independent and D = Dependent) ADLs: d  Bathing/Dressing- d  Meal Prep- d  Eating- d  Maintaining continence- d  Transferring/Ambulation- d  Managing Meds- d  Follow up appointments reviewed:  PCP Hospital f/u appt confirmed? Yes  Scheduled to see janece moore  on n/a @ n/a. [facility] f/u appt confirmed? No  Scheduled to see n/a on n/a @ n/a. Are transportation arrangements needed? No  If their condition worsens, is the pt aware to call PCP or go to the Emergency Dept.? Yes Was the patient provided with contact information for the PCP's office or ED? Yes Was to pt encouraged to call back with questions or concerns? Yes

## 2022-05-25 NOTE — ED Notes (Signed)
Patient verbalizes understanding of d/c instructions. Opportunities for questions and answers were provided. Pt d/c from ED and wheeled to lobby with family.  

## 2022-05-27 DIAGNOSIS — M170 Bilateral primary osteoarthritis of knee: Secondary | ICD-10-CM | POA: Diagnosis not present

## 2022-05-28 DIAGNOSIS — I480 Paroxysmal atrial fibrillation: Secondary | ICD-10-CM | POA: Diagnosis not present

## 2022-05-28 DIAGNOSIS — Z7901 Long term (current) use of anticoagulants: Secondary | ICD-10-CM | POA: Diagnosis not present

## 2022-05-30 LAB — CULTURE, BLOOD (ROUTINE X 2)
Culture: NO GROWTH
Culture: NO GROWTH

## 2022-05-31 DIAGNOSIS — I2510 Atherosclerotic heart disease of native coronary artery without angina pectoris: Secondary | ICD-10-CM | POA: Diagnosis not present

## 2022-05-31 DIAGNOSIS — R072 Precordial pain: Secondary | ICD-10-CM | POA: Diagnosis not present

## 2022-05-31 DIAGNOSIS — N186 End stage renal disease: Secondary | ICD-10-CM | POA: Diagnosis not present

## 2022-05-31 DIAGNOSIS — I5022 Chronic systolic (congestive) heart failure: Secondary | ICD-10-CM | POA: Diagnosis not present

## 2022-05-31 DIAGNOSIS — E1129 Type 2 diabetes mellitus with other diabetic kidney complication: Secondary | ICD-10-CM | POA: Diagnosis not present

## 2022-06-25 DIAGNOSIS — E213 Hyperparathyroidism, unspecified: Secondary | ICD-10-CM | POA: Diagnosis not present

## 2022-06-25 DIAGNOSIS — L299 Pruritus, unspecified: Secondary | ICD-10-CM | POA: Diagnosis not present

## 2022-06-25 DIAGNOSIS — D631 Anemia in chronic kidney disease: Secondary | ICD-10-CM | POA: Diagnosis not present

## 2022-06-25 DIAGNOSIS — D509 Iron deficiency anemia, unspecified: Secondary | ICD-10-CM | POA: Diagnosis not present

## 2022-06-25 DIAGNOSIS — N186 End stage renal disease: Secondary | ICD-10-CM | POA: Diagnosis not present

## 2022-06-25 DIAGNOSIS — D689 Coagulation defect, unspecified: Secondary | ICD-10-CM | POA: Diagnosis not present

## 2022-06-25 DIAGNOSIS — N2581 Secondary hyperparathyroidism of renal origin: Secondary | ICD-10-CM | POA: Diagnosis not present

## 2022-06-25 DIAGNOSIS — Z992 Dependence on renal dialysis: Secondary | ICD-10-CM | POA: Diagnosis not present

## 2022-06-28 DIAGNOSIS — N2581 Secondary hyperparathyroidism of renal origin: Secondary | ICD-10-CM | POA: Diagnosis not present

## 2022-06-28 DIAGNOSIS — Z992 Dependence on renal dialysis: Secondary | ICD-10-CM | POA: Diagnosis not present

## 2022-06-28 DIAGNOSIS — N186 End stage renal disease: Secondary | ICD-10-CM | POA: Diagnosis not present

## 2022-06-28 DIAGNOSIS — D689 Coagulation defect, unspecified: Secondary | ICD-10-CM | POA: Diagnosis not present

## 2022-06-28 DIAGNOSIS — D631 Anemia in chronic kidney disease: Secondary | ICD-10-CM | POA: Diagnosis not present

## 2022-06-28 DIAGNOSIS — E213 Hyperparathyroidism, unspecified: Secondary | ICD-10-CM | POA: Diagnosis not present

## 2022-06-28 DIAGNOSIS — D509 Iron deficiency anemia, unspecified: Secondary | ICD-10-CM | POA: Diagnosis not present

## 2022-06-28 DIAGNOSIS — L299 Pruritus, unspecified: Secondary | ICD-10-CM | POA: Diagnosis not present

## 2022-06-29 ENCOUNTER — Encounter: Payer: Self-pay | Admitting: Nurse Practitioner

## 2022-06-29 DIAGNOSIS — I5022 Chronic systolic (congestive) heart failure: Secondary | ICD-10-CM | POA: Diagnosis not present

## 2022-06-29 DIAGNOSIS — K5730 Diverticulosis of large intestine without perforation or abscess without bleeding: Secondary | ICD-10-CM | POA: Insufficient documentation

## 2022-06-29 DIAGNOSIS — R072 Precordial pain: Secondary | ICD-10-CM | POA: Diagnosis not present

## 2022-06-29 DIAGNOSIS — K909 Intestinal malabsorption, unspecified: Secondary | ICD-10-CM | POA: Insufficient documentation

## 2022-06-29 DIAGNOSIS — N186 End stage renal disease: Secondary | ICD-10-CM | POA: Diagnosis not present

## 2022-06-29 DIAGNOSIS — I2510 Atherosclerotic heart disease of native coronary artery without angina pectoris: Secondary | ICD-10-CM | POA: Diagnosis not present

## 2022-06-30 ENCOUNTER — Encounter (HOSPITAL_COMMUNITY): Payer: Self-pay | Admitting: *Deleted

## 2022-06-30 DIAGNOSIS — E213 Hyperparathyroidism, unspecified: Secondary | ICD-10-CM | POA: Diagnosis not present

## 2022-06-30 DIAGNOSIS — N2581 Secondary hyperparathyroidism of renal origin: Secondary | ICD-10-CM | POA: Diagnosis not present

## 2022-06-30 DIAGNOSIS — N186 End stage renal disease: Secondary | ICD-10-CM | POA: Diagnosis not present

## 2022-06-30 DIAGNOSIS — Z992 Dependence on renal dialysis: Secondary | ICD-10-CM | POA: Diagnosis not present

## 2022-06-30 DIAGNOSIS — L299 Pruritus, unspecified: Secondary | ICD-10-CM | POA: Diagnosis not present

## 2022-06-30 DIAGNOSIS — D509 Iron deficiency anemia, unspecified: Secondary | ICD-10-CM | POA: Diagnosis not present

## 2022-06-30 DIAGNOSIS — D689 Coagulation defect, unspecified: Secondary | ICD-10-CM | POA: Diagnosis not present

## 2022-06-30 DIAGNOSIS — D631 Anemia in chronic kidney disease: Secondary | ICD-10-CM | POA: Diagnosis not present

## 2022-07-01 ENCOUNTER — Encounter: Payer: Self-pay | Admitting: Nurse Practitioner

## 2022-07-01 ENCOUNTER — Encounter (HOSPITAL_COMMUNITY): Payer: Self-pay | Admitting: *Deleted

## 2022-07-01 ENCOUNTER — Ambulatory Visit (INDEPENDENT_AMBULATORY_CARE_PROVIDER_SITE_OTHER): Payer: Medicare HMO | Admitting: Nurse Practitioner

## 2022-07-01 VITALS — BP 122/68 | HR 117 | Temp 98.1°F | Ht 68.0 in | Wt 233.0 lb

## 2022-07-01 DIAGNOSIS — G8929 Other chronic pain: Secondary | ICD-10-CM | POA: Diagnosis not present

## 2022-07-01 DIAGNOSIS — M25512 Pain in left shoulder: Secondary | ICD-10-CM

## 2022-07-01 DIAGNOSIS — K5732 Diverticulitis of large intestine without perforation or abscess without bleeding: Secondary | ICD-10-CM

## 2022-07-01 DIAGNOSIS — N186 End stage renal disease: Secondary | ICD-10-CM

## 2022-07-01 DIAGNOSIS — I132 Hypertensive heart and chronic kidney disease with heart failure and with stage 5 chronic kidney disease, or end stage renal disease: Secondary | ICD-10-CM

## 2022-07-01 DIAGNOSIS — Z992 Dependence on renal dialysis: Secondary | ICD-10-CM | POA: Diagnosis not present

## 2022-07-01 DIAGNOSIS — M25511 Pain in right shoulder: Secondary | ICD-10-CM | POA: Diagnosis not present

## 2022-07-01 DIAGNOSIS — E1122 Type 2 diabetes mellitus with diabetic chronic kidney disease: Secondary | ICD-10-CM | POA: Diagnosis not present

## 2022-07-01 MED ORDER — ACIDOPHILUS/BIFIDUS PO WAFR: 1.0000 | WAFER | Freq: Every day | ORAL | 1 refills | Status: DC

## 2022-07-01 NOTE — Progress Notes (Signed)
I,Sheena H Holbrook,acting as a Education administrator for [name], FNP.,have documented all relevant documentation on the behalf of [name], FNP,as directed by  [name], FNP while in the presence of [name], [place].    Subjective:     Patient ID: [name] , male    DOB: [birth year] , 62 y.o.   MRN: [id number]   Chief Complaint  Patient presents with   Follow-up   Abdominal Pain    Left side, finished atbx    HPI  Patient presents today for follow up.  Patient reports continued abd pain, left side. Patient did finish recent atbx for diverticulitis. Patient does still have daily diarrhea, denies nausea/emesis. He is established with Dr. Collene Mares. They have taken over the counter medications for diarrhea. He does not have his gallbladder.      Past Medical History:  Diagnosis Date   Acute lower GI bleeding 06/21/2018   Acute respiratory failure with hypoxia (HCC) 01/13/2015   Atrial flutter (HCC)    Bile leak, postoperative 03/15/2016   Biliary dyskinesia 02/12/2016   Blind right eye    Hx: of partial blindness in right eye   Cardiomyopathy    CHF (congestive heart failure) ([place])    Coronary artery disease    normal coronaries by 10/10/08 cath, Cardiac Cath 08-04-12 epic.Dr. Doylene Canard follows   Diabetes mellitus    pt. states he's borderline diabetic., no longer taking med,- off med. since 2013   Dialysis patient [facility])    Mon-Wed-Fri([place])- Left AV fistula   Diverticulitis 03/2015   reoccurred in December 2016   Diverticulitis of intestine without perforation or abscess without bleeding 04/21/2015   Dizziness 11/22/2018   DVT (deep venous thrombosis) (HCC)    ESRD (end stage renal disease) ([place])    GERD (gastroesophageal reflux disease)    Gout    Hemorrhage of left kidney 01/13/2018   History of nephrectomy 07/04/2012   History of right nephrectomy in 2002 for renal cell carcinoma    History of unilateral nephrectomy    Hypertension    Hypotension  due to hypovolemia 06/04/2020   Low iron    Myocardial infarction [facility]) ?2006   Renal cell carcinoma    dialysis- MWF- [address]- Dr. Mercy Sumiko Ceasar follows.   Renal insufficiency    Shortness of breath 05/19/2011   "at rest, lying down, w/exertion"   Stroke [facility]) 02/2011   05/19/11 denies residual   Umbilical hernia [id number]   unrepaired   Wears glasses      Family History  Problem Relation Age of Onset   Hypertension Mother    Kidney disease Mother      Current Outpatient Medications:    albuterol (VENTOLIN HFA) 108 (90 Base) MCG/ACT inhaler, Inhale 2 puffs into the lungs every 6 (six) hours as needed for wheezing or shortness of breath., Disp: 18 g, Rfl: 2   calcium acetate (PHOSLO) 667 MG capsule, Take 1-3 capsules (667-2,001 mg total) by mouth See admin instructions. Take 2001 mg capsules with meals three times daily and 667 mg capsule with snacks. (Patient taking differently: Take 2,001 mg by mouth daily.), Disp:  , Rfl:    chlorhexidine (PERIDEX) 0.12 % solution, 15 mLs by Mouth Rinse route 2 (two) times daily., Disp: , Rfl:    cinacalcet (SENSIPAR) 90 MG tablet, Take 180 mg by mouth daily., Disp: , Rfl:    Control Gel Formula Dressing (DUODERM CGF EXTRA THIN) MISC, Apply 1 each topically daily as needed., Disp: 10 each,  Rfl: 2   diclofenac Sodium (VOLTAREN) 1 % GEL, Apply 2 g topically 4 (four) times daily., Disp: 100 g, Rfl: 2   digoxin (LANOXIN) 0.125 MG tablet, Take 1 tablet (0.125 mg total) by mouth every Monday, Wednesday, and Friday., Disp: 30 tablet, Rfl: 1   ferric citrate (AURYXIA) 1 GM 210 MG(Fe) tablet, Take 630 mg by mouth 3 (three) times daily with meals., Disp: , Rfl:    fluticasone furoate-vilanterol (BREO ELLIPTA) 100-25 MCG/ACT AEPB, Inhale 1 puff into the lungs daily., Disp: 60 each, Rfl: 6   gabapentin (NEURONTIN) 300 MG capsule, Take 1 capsule (300 mg total) by mouth 3 (three) times daily., Disp: 180 capsule, Rfl: 1   glucose blood test strip, Use to  check blood sugar 3 times a day. Dx code e11.65, Disp: 100 each, Rfl: 12   HYDROcodone-acetaminophen (NORCO) 10-325 MG tablet, Take 0.5 tablets by mouth 2 (two) times daily as needed for moderate pain., Disp: , Rfl:    ipratropium (ATROVENT) 0.06 % nasal spray, Place 2 sprays into both nostrils 4 (four) times daily. (Patient taking differently: Place 2 sprays into both nostrils 4 (four) times daily as needed for rhinitis.), Disp: 15 mL, Rfl: 12   isosorbide dinitrate (ISORDIL) 10 MG tablet, Take 10 mg by mouth 2 (two) times daily., Disp: , Rfl:    Lancets (ONETOUCH DELICA PLUS 123XX123) MISC, Use to check blood sugar 3 times a day. Dx code e11.65, Disp: 100 each, Rfl: 3   lidocaine-prilocaine (EMLA) cream, Apply 1 application topically See admin instructions. Apply topically to port access prior to dialysis - Monday, Wednesday, Friday., Disp: , Rfl: 12   loperamide (IMODIUM A-D) 2 MG tablet, Take 1 tablet (2 mg total) by mouth 4 (four) times daily as needed for diarrhea or loose stools. Also available OTC., Disp: 30 tablet, Rfl: 0   meclizine (ANTIVERT) 25 MG tablet, Take 1 tablet (25 mg total) by mouth 3 (three) times daily as needed for dizziness., Disp: 30 tablet, Rfl: 0   metoprolol succinate (TOPROL-XL) 25 MG 24 hr tablet, Take 1 tablet (25 mg total) by mouth daily., Disp: 30 tablet, Rfl: 3   metoprolol tartrate (LOPRESSOR) 25 MG tablet, Take 25 mg by mouth 2 (two) times daily., Disp: , Rfl:    midodrine (PROAMATINE) 10 MG tablet, Take 1 tablet (10 mg total) by mouth 3 (three) times daily., Disp: 90 tablet, Rfl: 3   multivitamin (RENA-VIT) TABS tablet, Take 1 tablet by mouth at bedtime., Disp: , Rfl:    nitroGLYCERIN (NITROSTAT) 0.4 MG SL tablet, Place 0.4 mg under the tongue every 5 (five) minutes as needed for chest pain. , Disp: , Rfl: 0   Nutritional Supplements (FEEDING SUPPLEMENT, NEPRO CARB STEADY,) LIQD, Take 237 mLs by mouth daily., Disp: , Rfl:    ondansetron (ZOFRAN ODT) 4 MG  disintegrating tablet, Take 1 tablet (4 mg total) by mouth every 8 (eight) hours as needed for nausea or vomiting., Disp: 20 tablet, Rfl: 0   oxyCODONE-acetaminophen (PERCOCET/ROXICET) 5-325 MG tablet, Take 1 tablet by mouth every 4 (four) hours as needed for severe pain., Disp: 30 tablet, Rfl: 0   pantoprazole (PROTONIX) 40 MG tablet, Take 40 mg by mouth 2 (two) times daily., Disp: , Rfl:    sucroferric oxyhydroxide (VELPHORO) 500 MG chewable tablet, Chew 500-1,000 mg by mouth See admin instructions. Takes 2 tablets with each meal and 1 tablet with snacks., Disp: , Rfl:    UNABLE TO FIND, Out patient physical therapy- vestibular therapy  for BPPV, Disp: 1 each, Rfl: 0   warfarin (COUMADIN) 7.5 MG tablet, Take 7.5 mg by mouth daily., Disp: , Rfl:    Etelcalcetide HCl (PARSABIV IV), Etelcalcetide (Parsabiv), Disp: , Rfl:    Lactobacillus (ACIDOPHILUS/BIFIDUS) WAFR, Take 1 Wafer by mouth daily., Disp: 90 Wafer, Rfl: 1   molnupiravir EUA (LAGEVRIO) 200 mg CAPS capsule, Take 4 capsules (800 mg total) by mouth 2 (two) times daily for 5 days., Disp: 40 capsule, Rfl: 0   rifaximin (XIFAXAN) 550 MG TABS tablet, Take 1 tablet (550 mg total) by mouth 3 (three) times daily., Disp: 42 tablet, Rfl: 0   Allergies  Allergen Reactions   Ace Inhibitors Itching, Cough and Other (See Comments)     Review of Systems  Constitutional:  Positive for appetite change.  HENT: Negative.    Respiratory: Negative.    Cardiovascular: Negative.   Gastrointestinal:  Positive for abdominal pain and diarrhea.  Musculoskeletal:  Positive for arthralgias (bilateral shoulder pain and decreased range of motion) and gait problem (unable to walk long distances).       Bilateral shoulder pain  Allergic/Immunologic: Negative.   Hematological: Negative.   All other systems reviewed and are negative.    Today's Vitals   07/01/22 1011  BP: 122/68  Pulse: (!) 117  Temp: 98.1 F (36.7 C)  TempSrc: Oral  SpO2: 98%  Weight: 233  lb (105.7 kg)  Height: '5\' 8"'$  (1.727 m)   Body mass index is 35.43 kg/m.   Objective:  Physical Exam Vitals reviewed.  Constitutional:      General: He is not in acute distress.    Appearance: Normal appearance. He is obese.  Cardiovascular:     Rate and Rhythm: Normal rate and regular rhythm.     Pulses: Normal pulses.     Heart sounds: Normal heart sounds. No murmur heard. Pulmonary:     Effort: Pulmonary effort is normal. No respiratory distress.     Breath sounds: Normal breath sounds. No wheezing.  Skin:    General: Skin is warm and dry.     Capillary Refill: Capillary refill takes less than 2 seconds.  Neurological:     General: No focal deficit present.     Mental Status: He is alert and oriented to person, place, and time.     Cranial Nerves: No cranial nerve deficit.     Motor: No weakness.  Psychiatric:        Mood and Affect: Mood normal.        Behavior: Behavior normal.        Thought Content: Thought content normal.        Judgment: Judgment normal.         Assessment And Plan:     1. Diverticulitis large intestine w/o perforation or abscess w/o bleeding Comments: He is advised to follow up with Dr. Collene Mares  2. Hypertensive heart and CKD, ESRD on dialysis, w CHF ([place]) Comments: Blood pressure is well controlled. Continue current medications and f/u with Cardiology and Nephrology - AMB Referral to [place] (ACO Patients)  3. Type 2 diabetes mellitus with end-stage renal disease ([place]) Comments: Controlled type 2 diabetes and continue with dialysis. - AMB Referral to [place] (ACO Patients)  4. Chronic pain of both shoulders Comments: Continue follow up with Orthopedics - AMB Referral to [place] (ACO Patients)     Patient was given opportunity to ask questions. Patient verbalized understanding of the plan and was able to  repeat key elements of the plan. All questions were answered to their  satisfaction.  [name], FNP   I, [name], FNP, have reviewed all documentation for this visit. The documentation on 07/01/22 for the exam, diagnosis, procedures, and orders are all accurate and complete.   IF YOU HAVE BEEN REFERRED TO A SPECIALIST, IT MAY TAKE 1-2 WEEKS TO SCHEDULE/PROCESS THE REFERRAL. IF YOU HAVE NOT HEARD FROM US/SPECIALIST IN TWO WEEKS, PLEASE GIVE [place] A CALL AT [phone] X [phone].   THE PATIENT IS ENCOURAGED TO PRACTICE SOCIAL DISTANCING DUE TO THE COVID-19 PANDEMIC.

## 2022-07-02 ENCOUNTER — Telehealth: Payer: Self-pay | Admitting: *Deleted

## 2022-07-02 DIAGNOSIS — L299 Pruritus, unspecified: Secondary | ICD-10-CM | POA: Diagnosis not present

## 2022-07-02 DIAGNOSIS — D509 Iron deficiency anemia, unspecified: Secondary | ICD-10-CM | POA: Diagnosis not present

## 2022-07-02 DIAGNOSIS — N2581 Secondary hyperparathyroidism of renal origin: Secondary | ICD-10-CM | POA: Diagnosis not present

## 2022-07-02 DIAGNOSIS — D689 Coagulation defect, unspecified: Secondary | ICD-10-CM | POA: Diagnosis not present

## 2022-07-02 DIAGNOSIS — Z992 Dependence on renal dialysis: Secondary | ICD-10-CM | POA: Diagnosis not present

## 2022-07-02 DIAGNOSIS — N186 End stage renal disease: Secondary | ICD-10-CM | POA: Diagnosis not present

## 2022-07-02 DIAGNOSIS — E213 Hyperparathyroidism, unspecified: Secondary | ICD-10-CM | POA: Diagnosis not present

## 2022-07-02 DIAGNOSIS — D631 Anemia in chronic kidney disease: Secondary | ICD-10-CM | POA: Diagnosis not present

## 2022-07-02 NOTE — Progress Notes (Unsigned)
  Care Coordination  Outreach Note  07/02/2022 Name: [name] MRN: [id number] DOB: 1960-08-10   Care Coordination Outreach Attempts: An unsuccessful telephone outreach was attempted today.   Follow Up Plan:  Additional outreach attempts will be made.   Encounter Outcome:  No Answer  [place]  Direct Dial: [phone]

## 2022-07-05 DIAGNOSIS — E213 Hyperparathyroidism, unspecified: Secondary | ICD-10-CM | POA: Diagnosis not present

## 2022-07-05 DIAGNOSIS — D509 Iron deficiency anemia, unspecified: Secondary | ICD-10-CM | POA: Diagnosis not present

## 2022-07-05 DIAGNOSIS — D689 Coagulation defect, unspecified: Secondary | ICD-10-CM | POA: Diagnosis not present

## 2022-07-05 DIAGNOSIS — N2581 Secondary hyperparathyroidism of renal origin: Secondary | ICD-10-CM | POA: Diagnosis not present

## 2022-07-05 DIAGNOSIS — N186 End stage renal disease: Secondary | ICD-10-CM | POA: Diagnosis not present

## 2022-07-05 DIAGNOSIS — D631 Anemia in chronic kidney disease: Secondary | ICD-10-CM | POA: Diagnosis not present

## 2022-07-05 DIAGNOSIS — L299 Pruritus, unspecified: Secondary | ICD-10-CM | POA: Diagnosis not present

## 2022-07-05 DIAGNOSIS — Z992 Dependence on renal dialysis: Secondary | ICD-10-CM | POA: Diagnosis not present

## 2022-07-05 NOTE — Progress Notes (Unsigned)
  Care Coordination  Outreach Note  07/05/2022 Name: JAVONNE DORKO MRN: [id number] DOB: 1960-12-06   Care Coordination Outreach Attempts: A second unsuccessful outreach was attempted today to offer the patient with information about available care coordination services as a benefit of their health plan.     Follow Up Plan:  Additional outreach attempts will be made to offer the patient care coordination information and services.   Encounter Outcome:  No Answer  [place]  Direct Dial: [phone]

## 2022-07-06 ENCOUNTER — Encounter (HOSPITAL_COMMUNITY): Payer: Self-pay | Admitting: *Deleted

## 2022-07-06 DIAGNOSIS — M25812 Other specified joint disorders, left shoulder: Secondary | ICD-10-CM | POA: Diagnosis not present

## 2022-07-06 DIAGNOSIS — M25811 Other specified joint disorders, right shoulder: Secondary | ICD-10-CM | POA: Diagnosis not present

## 2022-07-06 NOTE — Progress Notes (Signed)
  Care Coordination   Note   07/06/2022 Name: [name] MRN: [id number] DOB: Mar 14, 1961  [name] is a 62 y.o. year old male who sees [name], [place] for primary care. I reached out to [name] by phone today in reference to a referral.  Follow up plan:  Telephone appointment with care coordination team member scheduled for:  07/13/22  Encounter Outcome:  Pt. Scheduled  [place]  Direct Dial: [phone]

## 2022-07-07 DIAGNOSIS — L299 Pruritus, unspecified: Secondary | ICD-10-CM | POA: Diagnosis not present

## 2022-07-07 DIAGNOSIS — Z992 Dependence on renal dialysis: Secondary | ICD-10-CM | POA: Diagnosis not present

## 2022-07-07 DIAGNOSIS — E213 Hyperparathyroidism, unspecified: Secondary | ICD-10-CM | POA: Diagnosis not present

## 2022-07-07 DIAGNOSIS — D631 Anemia in chronic kidney disease: Secondary | ICD-10-CM | POA: Diagnosis not present

## 2022-07-07 DIAGNOSIS — N186 End stage renal disease: Secondary | ICD-10-CM | POA: Diagnosis not present

## 2022-07-07 DIAGNOSIS — D509 Iron deficiency anemia, unspecified: Secondary | ICD-10-CM | POA: Diagnosis not present

## 2022-07-07 DIAGNOSIS — N2581 Secondary hyperparathyroidism of renal origin: Secondary | ICD-10-CM | POA: Diagnosis not present

## 2022-07-07 DIAGNOSIS — D689 Coagulation defect, unspecified: Secondary | ICD-10-CM | POA: Diagnosis not present

## 2022-07-09 DIAGNOSIS — D509 Iron deficiency anemia, unspecified: Secondary | ICD-10-CM | POA: Diagnosis not present

## 2022-07-09 DIAGNOSIS — E213 Hyperparathyroidism, unspecified: Secondary | ICD-10-CM | POA: Diagnosis not present

## 2022-07-09 DIAGNOSIS — Z992 Dependence on renal dialysis: Secondary | ICD-10-CM | POA: Diagnosis not present

## 2022-07-09 DIAGNOSIS — N186 End stage renal disease: Secondary | ICD-10-CM | POA: Diagnosis not present

## 2022-07-09 DIAGNOSIS — D689 Coagulation defect, unspecified: Secondary | ICD-10-CM | POA: Diagnosis not present

## 2022-07-09 DIAGNOSIS — D631 Anemia in chronic kidney disease: Secondary | ICD-10-CM | POA: Diagnosis not present

## 2022-07-09 DIAGNOSIS — N2581 Secondary hyperparathyroidism of renal origin: Secondary | ICD-10-CM | POA: Diagnosis not present

## 2022-07-09 DIAGNOSIS — L299 Pruritus, unspecified: Secondary | ICD-10-CM | POA: Diagnosis not present

## 2022-07-12 DIAGNOSIS — D509 Iron deficiency anemia, unspecified: Secondary | ICD-10-CM | POA: Diagnosis not present

## 2022-07-12 DIAGNOSIS — D631 Anemia in chronic kidney disease: Secondary | ICD-10-CM | POA: Diagnosis not present

## 2022-07-12 DIAGNOSIS — Z992 Dependence on renal dialysis: Secondary | ICD-10-CM | POA: Diagnosis not present

## 2022-07-12 DIAGNOSIS — L299 Pruritus, unspecified: Secondary | ICD-10-CM | POA: Diagnosis not present

## 2022-07-12 DIAGNOSIS — N2581 Secondary hyperparathyroidism of renal origin: Secondary | ICD-10-CM | POA: Diagnosis not present

## 2022-07-12 DIAGNOSIS — N186 End stage renal disease: Secondary | ICD-10-CM | POA: Diagnosis not present

## 2022-07-12 DIAGNOSIS — E213 Hyperparathyroidism, unspecified: Secondary | ICD-10-CM | POA: Diagnosis not present

## 2022-07-12 DIAGNOSIS — D689 Coagulation defect, unspecified: Secondary | ICD-10-CM | POA: Diagnosis not present

## 2022-07-13 ENCOUNTER — Ambulatory Visit: Payer: Self-pay

## 2022-07-13 NOTE — Patient Instructions (Signed)
Visit Information  Thank you for taking time to visit with me today. Please don't hesitate to contact me if I can be of assistance to you.   Following are the goals we discussed today:   Goals Addressed             This Visit's Progress    COMPLETED: Care Coordination Activities       Care Coordination Interventions: Education provided to both patient and spouse re: [employer] health plan benefits that offer transportation as well as over the counter products and extra benefits card Discussed patient will be switching to [facility] effective March 1 Advised to utilized OTC and extra benefits card now under [place] will also offer transportation and OTC benefits; spouse to follow up with [facility] as needed Determined patient is in need of a ramp to help safely leave the home, patient does own his home Referral placed via [place] to [employer] Provided patients spouse with contact number to [employer] should she need to follow up          If you are experiencing a [place] or [place] or need someone to talk to, please call 911  Patient verbalizes understanding of instructions and care plan provided today and agrees to view in [place]. Active MyChart status and patient understanding of how to access instructions and care plan via MyChart confirmed with patient.     No further follow up required: Please contact your primary care provider as needed.  Daneen Schick, BSW, CDP Social Worker, Certified Dementia Practitioner [place] [facility]  Care Coordination [phone]

## 2022-07-13 NOTE — Patient Outreach (Signed)
  Care Coordination   Initial Visit Note   07/13/2022 Name: [name] MRN: [id number] DOB: [birth year]  [name] is a 62 y.o. year old male who sees [name], [place] for primary care. I spoke with  [name] by phone today.  What matters to the patients health and wellness today?  Resources for transportation and a ramp   Goals Addressed             This Visit's Progress    COMPLETED: Care Coordination Activities       Care Coordination Interventions: Education provided to both patient and spouse re: [employer] health plan benefits that offer transportation as well as over the counter products and extra benefits card Discussed patient will be switching to [facility] effective March 1 Advised to utilized OTC and extra benefits card now under [place] will also offer transportation and OTC benefits; spouse to follow up with [facility] as needed Determined patient is in need of a ramp to help safely leave the home, patient does own his home Referral placed via [place] to [employer] Provided patients spouse with contact number to [employer] should she need to follow up         SDOH assessments and interventions completed:  Yes  SDOH Interventions Today    Flowsheet Row Most Recent Value  SDOH Interventions   Food Insecurity Interventions Intervention Not Indicated  Housing Interventions Other (Comment)  [CHS - ramp]  Transportation Interventions Payor Benefit  Utilities Interventions Intervention Not Indicated        Care Coordination Interventions:  Yes, provided   Interventions Today    Flowsheet Row Most Recent Value  Chronic Disease   Chronic disease during today's visit Hypertension (HTN), Congestive Heart Failure (CHF), Chronic Kidney Disease/End Stage Renal Disease (ESRD), Diabetes  General Interventions   General Interventions Discussed/Reviewed General Interventions Discussed, Sport and exercise psychologist Provided Provided [employer], Provided Education  Provided Verbal Education On [employer], Insurance Plans        Follow up plan: No further intervention required. Patient is not eligible for ongoing care coordination support at this time.    Encounter Outcome:  Pt. Visit Completed   Daneen Schick, BSW, CDP Social Worker, Certified Dementia Practitioner [place] [facility]  Care Coordination [phone]

## 2022-07-14 DIAGNOSIS — L299 Pruritus, unspecified: Secondary | ICD-10-CM | POA: Diagnosis not present

## 2022-07-14 DIAGNOSIS — E213 Hyperparathyroidism, unspecified: Secondary | ICD-10-CM | POA: Diagnosis not present

## 2022-07-14 DIAGNOSIS — N2581 Secondary hyperparathyroidism of renal origin: Secondary | ICD-10-CM | POA: Diagnosis not present

## 2022-07-14 DIAGNOSIS — D509 Iron deficiency anemia, unspecified: Secondary | ICD-10-CM | POA: Diagnosis not present

## 2022-07-14 DIAGNOSIS — D631 Anemia in chronic kidney disease: Secondary | ICD-10-CM | POA: Diagnosis not present

## 2022-07-14 DIAGNOSIS — D689 Coagulation defect, unspecified: Secondary | ICD-10-CM | POA: Diagnosis not present

## 2022-07-14 DIAGNOSIS — Z992 Dependence on renal dialysis: Secondary | ICD-10-CM | POA: Diagnosis not present

## 2022-07-14 DIAGNOSIS — N186 End stage renal disease: Secondary | ICD-10-CM | POA: Diagnosis not present

## 2022-07-15 ENCOUNTER — Telehealth (INDEPENDENT_AMBULATORY_CARE_PROVIDER_SITE_OTHER): Payer: Medicare HMO | Admitting: Nurse Practitioner

## 2022-07-15 ENCOUNTER — Telehealth: Payer: Self-pay

## 2022-07-15 DIAGNOSIS — U071 COVID-19: Secondary | ICD-10-CM | POA: Diagnosis not present

## 2022-07-15 DIAGNOSIS — M12811 Other specific arthropathies, not elsewhere classified, right shoulder: Secondary | ICD-10-CM | POA: Diagnosis not present

## 2022-07-15 DIAGNOSIS — K591 Functional diarrhea: Secondary | ICD-10-CM

## 2022-07-15 MED ORDER — MOLNUPIRAVIR EUA 200MG CAPSULE: 4.0000 | ORAL_CAPSULE | Freq: Two times a day (BID) | ORAL | 0 refills | Status: AC

## 2022-07-15 MED ORDER — RIFAXIMIN 550 MG PO TABS: 550.0000 mg | ORAL_TABLET | Freq: Three times a day (TID) | ORAL | 0 refills | Status: DC

## 2022-07-15 NOTE — Telephone Encounter (Signed)
Patient's wife called to report patient has cough, headache, congestion, for the past 2 days. He tested positive for COVID last night. Patient has been taking Coricidin and Tylenol. They are wanting to know if you have any other suggestions or if can take one of the antivirals for COVID.   Please advise, thank you!

## 2022-07-15 NOTE — Progress Notes (Signed)
Virtual Visit via MyChart   This visit type was conducted due to national recommendations for restrictions regarding the COVID-19 Pandemic (e.g. social distancing) in an effort to limit this patient's exposure and mitigate transmission in our community.  Due to his co-morbid illnesses, this patient is at least at moderate risk for complications without adequate follow up.  This format is felt to be most appropriate for this patient at this time.  All issues noted in this document were discussed and addressed.  A limited physical exam was performed with this format.    This visit type was conducted due to national recommendations for restrictions regarding the COVID-19 Pandemic (e.g. social distancing) in an effort to limit this patient's exposure and mitigate transmission in our community.  Patients identity confirmed using two different identifiers.  This format is felt to be most appropriate for this patient at this time.  All issues noted in this document were discussed and addressed.  No physical exam was performed (except for noted visual exam findings with Video Visits).    Date:  07/26/2022   ID:  [name], DOB 08-Sep-1960, MRN [id number]  Patient Location:  Home - spoke with [name] and wife [name]  Provider location:   Office    Chief Complaint:  positive covid  History of Present Illness:    [name] is a 62 y.o. male who presents via video conferencing for a telehealth visit today.    The patient does have symptoms concerning for COVID-19 infection (fever, chills, cough, or new shortness of breath).   Patient presents today for COVID positive test at home yesterday. Patient's wife reports he has had cough, congestion and headache for 2 days; denies shob/wheeze, fever/chills or other symptoms. He has only been going to dialysis. They do wear their mask. Denies fever. Denies chest pain. Continues to have his sense of taste and smell. He has taken Tylenol,  Coricidan and Mucinex.    He does not have a date for his right shoulder. He has been cleared by Cardiology.   Patient was seen for diarrhea earlier in the month and family is requesting Xifaxin.      Past Medical History:  Diagnosis Date   Acute lower GI bleeding 06/21/2018   Acute respiratory failure with hypoxia (HCC) 01/13/2015   Atrial flutter (HCC)    Bile leak, postoperative 03/15/2016   Biliary dyskinesia 02/12/2016   Blind right eye    Hx: of partial blindness in right eye   Cardiomyopathy    CHF (congestive heart failure) ([place])    Coronary artery disease    normal coronaries by 10/10/08 cath, Cardiac Cath 08-04-12 epic.Dr. Doylene Canard follows   Diabetes mellitus    pt. states he's borderline diabetic., no longer taking med,- off med. since 2013   Dialysis patient [facility])    Mon-Wed-Fri([place])- Left AV fistula   Diverticulitis 03/2015   reoccurred in December 2016   Diverticulitis of intestine without perforation or abscess without bleeding 04/21/2015   Dizziness 11/22/2018   DVT (deep venous thrombosis) (HCC)    ESRD (end stage renal disease) ([place])    GERD (gastroesophageal reflux disease)    Gout    Hemorrhage of left kidney 01/13/2018   History of nephrectomy 07/04/2012   History of right nephrectomy in 2002 for renal cell carcinoma    History of unilateral nephrectomy    Hypertension    Hypotension due to hypovolemia 06/04/2020   Low iron    Myocardial  infarction [facility]) ?2006   Renal cell carcinoma    dialysis- MWF- [address]- Dr. Mercy  follows.   Renal insufficiency    Shortness of breath 05/19/2011   "at rest, lying down, w/exertion"   Stroke [facility]) 02/2011   05/19/11 denies residual   Umbilical hernia [id number]   unrepaired   Wears glasses    Past Surgical History:  Procedure Laterality Date   ANAL FISTULOTOMY  08/15/2018   AV FISTULA PLACEMENT  03/29/2011   Procedure: ARTERIOVENOUS (AV) FISTULA CREATION;  Surgeon: Hinda Lenis, MD;  Location: [place];  Service: Vascular;  Laterality: Left;  LEFT RADIOCEPHALIC , Arteriovenous [id number])   CARDIAC CATHETERIZATION  05/21/11   CARDIAC CATHETERIZATION N/A 03/16/2016   Procedure: Left Heart Cath and Coronary Angiography;  Surgeon: Dixie Dials, MD;  Location: [place] CV LAB;  Service: Cardiovascular;  Laterality: N/A;   CHOLECYSTECTOMY N/A 02/12/2016   Procedure: LAPAROSCOPIC CHOLECYSTECTOMY WITH INTRAOPERATIVE CHOLANGIOGRAM;  Surgeon: Jackolyn Confer, MD;  Location: [place];  Service: General;  Laterality: N/A;   COLONOSCOPY WITH PROPOFOL N/A 02/01/2017   Procedure: COLONOSCOPY WITH PROPOFOL;  Surgeon: Juanita Craver, MD;  Location: [facility];  Service: Endoscopy;  Laterality: N/A;   dialysis cath placed     ESOPHAGOGASTRODUODENOSCOPY (EGD) WITH PROPOFOL N/A 12/02/2015   Procedure: ESOPHAGOGASTRODUODENOSCOPY (EGD) WITH PROPOFOL;  Surgeon: Juanita Craver, MD;  Location: [facility];  Service: Endoscopy;  Laterality: N/A;   EVALUATION UNDER ANESTHESIA WITH HEMORRHOIDECTOMY N/A 08/15/2018   Procedure: HEMORRHOID LIGATION/PEXY;  Surgeon: Michael Boston, MD;  Location: [place];  Service: General;  Laterality: N/A;   FINGER SURGERY     L pinkie finger- ORIF- /w remaining hardware - 1990's     FISTULOTOMY N/A 08/15/2018   Procedure: SUPERFICIAL ANAL FISTULOTOMY;  Surgeon: Michael Boston, MD;  Location: [place];  Service: General;  Laterality: N/A;   HEMORRHOID SURGERY     HERNIA REPAIR N/A [id number]   Umbilical hernia repair   INSERTION OF DIALYSIS CATHETER N/A 01/27/2016   Procedure: INSERTION OF DIALYSIS CATHETER;  Surgeon: Waynetta Sandy, MD;  Location: [place];  Service: Vascular;  Laterality: N/A;   INSERTION OF MESH N/A 07/04/2012   Procedure: INSERTION OF MESH;  Surgeon: Madilyn Hook, DO;  Location: [place];  Service: General;  Laterality: N/A;   IR EMBO ART  VEN HEMORR LYMPH EXTRAV  INC GUIDE ROADMAPPING  01/13/2018   IR FLUORO GUIDE CV LINE RIGHT  01/13/2018    IR IVC FILTER PLMT / S&I /IMG GUID/MOD SED  01/27/2018   IR RENAL SELECTIVE  UNI INC S&I MOD SED  01/13/2018   IR US GUIDE VASC ACCESS RIGHT  01/13/2018   KIDNEY CYST REMOVAL  2019   LAPAROSCOPIC LYSIS OF ADHESIONS N/A 02/12/2016   Procedure: LAPAROSCOPIC LYSIS OF ADHESIONS;  Surgeon: Jackolyn Confer, MD;  Location: [place];  Service: General;  Laterality: N/A;   LEFT AND RIGHT HEART CATHETERIZATION WITH CORONARY ANGIOGRAM N/A 08/04/2012   Procedure: LEFT AND RIGHT HEART CATHETERIZATION WITH CORONARY ANGIOGRAM;  Surgeon: Birdie Riddle, MD;  Location: [place] CATH LAB;  Service: Cardiovascular;  Laterality: N/A;   NEPHRECTOMY  2000   right   REVISON OF ARTERIOVENOUS FISTULA Left 06/05/2013   Procedure: REVISON OF LEFT RADIOCEPHALIC  ARTERIOVENOUS FISTULA;  Surgeon: Conrad Porcupine, MD;  Location: [place];  Service: Vascular;  Laterality: Left;   REVISON OF ARTERIOVENOUS FISTULA Left 01/27/2016   Procedure: REVISION OF LEFT UPPER EXTREMITY ARTERIOVENOUS FISTULA;  Surgeon: Waynetta Sandy, MD;  Location:  [facility] OR;  Service: Vascular;  Laterality: Left;   REVISON OF ARTERIOVENOUS FISTULA Left 08/03/2016   Procedure: REVISON OF Left arm ARTERIOVENOUS FISTULA;  Surgeon: Elam Dutch, MD;  Location: [facility] OR;  Service: Vascular;  Laterality: Left;   REVISON OF ARTERIOVENOUS FISTULA Left 05/06/2017   Procedure: REVISION PLICATION OF ARTERIOVENOUS FISTULA LEFT ARM;  Surgeon: Rosetta Posner, MD;  Location: [place];  Service: Vascular;  Laterality: Left;   REVISON OF ARTERIOVENOUS FISTULA Left 02/01/2019   Procedure: REVISION OF ARTERIOVENOUS FISTULA LEFT ARM;  Surgeon: [name], MD;  Location: [place];  Service: Vascular;  Laterality: Left;   REVISON OF ARTERIOVENOUS FISTULA Left 03/15/2019   Procedure: REVISION PLICATION OF ARTERIOVENOUS FISTULA LEFT ARM;  Surgeon: [name], MD;  Location: [place];  Service: Vascular;  Laterality: Left;   RIGHT HEART CATHETERIZATION N/A 05/21/2011   Procedure:  RIGHT HEART CATH;  Surgeon: Birdie Riddle, MD;  Location: [place] CATH LAB;  Service: Cardiovascular;  Laterality: N/A;   smashed  1990's   "left pinky; have a plate in there"   UMBILICAL HERNIA REPAIR N/A 07/04/2012   Procedure: LAPAROSCOPIC UMBILICAL HERNIA;  Surgeon: Madilyn Hook, DO;  Location: [place];  Service: General;  Laterality: N/A;   US ECHOCARDIOGRAPHY  05/20/11     Current Meds  Medication Sig   albuterol (VENTOLIN HFA) 108 (90 Base) MCG/ACT inhaler Inhale 2 puffs into the lungs every 6 (six) hours as needed for wheezing or shortness of breath.   calcium acetate (PHOSLO) 667 MG capsule Take 1-3 capsules (667-2,001 mg total) by mouth See admin instructions. Take 2001 mg capsules with meals three times daily and 667 mg capsule with snacks. (Patient taking differently: Take 2,001 mg by mouth daily.)   chlorhexidine (PERIDEX) 0.12 % solution 15 mLs by Mouth Rinse route 2 (two) times daily.   cinacalcet (SENSIPAR) 90 MG tablet Take 180 mg by mouth daily.   Control Gel Formula Dressing (DUODERM CGF EXTRA THIN) MISC Apply 1 each topically daily as needed.   diclofenac Sodium (VOLTAREN) 1 % GEL Apply 2 g topically 4 (four) times daily.   digoxin (LANOXIN) 0.125 MG tablet Take 1 tablet (0.125 mg total) by mouth every Monday, Wednesday, and Friday.   Etelcalcetide HCl (PARSABIV IV) Etelcalcetide (Parsabiv)   ferric citrate (AURYXIA) 1 GM 210 MG(Fe) tablet Take 630 mg by mouth 3 (three) times daily with meals.   fluticasone furoate-vilanterol (BREO ELLIPTA) 100-25 MCG/ACT AEPB Inhale 1 puff into the lungs daily.   gabapentin (NEURONTIN) 300 MG capsule Take 1 capsule (300 mg total) by mouth 3 (three) times daily.   glucose blood test strip Use to check blood sugar 3 times a day. Dx code e11.65   HYDROcodone-acetaminophen (NORCO) 10-325 MG tablet Take 0.5 tablets by mouth 2 (two) times daily as needed for moderate pain.   ipratropium (ATROVENT) 0.06 % nasal spray Place 2 sprays into both nostrils 4  (four) times daily. (Patient taking differently: Place 2 sprays into both nostrils 4 (four) times daily as needed for rhinitis.)   isosorbide dinitrate (ISORDIL) 10 MG tablet Take 10 mg by mouth 2 (two) times daily.   Lactobacillus (ACIDOPHILUS/BIFIDUS) WAFR Take 1 Wafer by mouth daily.   Lancets (ONETOUCH DELICA PLUS 123XX123) MISC Use to check blood sugar 3 times a day. Dx code e11.65   lidocaine-prilocaine (EMLA) cream Apply 1 application topically See admin instructions. Apply topically to port access prior to dialysis - Monday, Wednesday, Friday.   loperamide (IMODIUM  A-D) 2 MG tablet Take 1 tablet (2 mg total) by mouth 4 (four) times daily as needed for diarrhea or loose stools. Also available OTC.   meclizine (ANTIVERT) 25 MG tablet Take 1 tablet (25 mg total) by mouth 3 (three) times daily as needed for dizziness.   metoprolol succinate (TOPROL-XL) 25 MG 24 hr tablet Take 1 tablet (25 mg total) by mouth daily.   metoprolol tartrate (LOPRESSOR) 25 MG tablet Take 25 mg by mouth 2 (two) times daily.   midodrine (PROAMATINE) 10 MG tablet Take 1 tablet (10 mg total) by mouth 3 (three) times daily.   [EXPIRED] molnupiravir EUA (LAGEVRIO) 200 mg CAPS capsule Take 4 capsules (800 mg total) by mouth 2 (two) times daily for 5 days.   multivitamin (RENA-VIT) TABS tablet Take 1 tablet by mouth at bedtime.   nitroGLYCERIN (NITROSTAT) 0.4 MG SL tablet Place 0.4 mg under the tongue every 5 (five) minutes as needed for chest pain.    Nutritional Supplements (FEEDING SUPPLEMENT, NEPRO CARB STEADY,) LIQD Take 237 mLs by mouth daily.   ondansetron (ZOFRAN ODT) 4 MG disintegrating tablet Take 1 tablet (4 mg total) by mouth every 8 (eight) hours as needed for nausea or vomiting.   oxyCODONE-acetaminophen (PERCOCET/ROXICET) 5-325 MG tablet Take 1 tablet by mouth every 4 (four) hours as needed for severe pain.   pantoprazole (PROTONIX) 40 MG tablet Take 40 mg by mouth 2 (two) times daily.   rifaximin (XIFAXAN)  550 MG TABS tablet Take 1 tablet (550 mg total) by mouth 3 (three) times daily.   sucroferric oxyhydroxide (VELPHORO) 500 MG chewable tablet Chew 500-1,000 mg by mouth See admin instructions. Takes 2 tablets with each meal and 1 tablet with snacks.   UNABLE TO FIND Out patient physical therapy- vestibular therapy for BPPV   warfarin (COUMADIN) 7.5 MG tablet Take 7.5 mg by mouth daily.   [DISCONTINUED] ciprofloxacin (CIPRO) 500 MG tablet Take 1 tablet (500 mg total) by mouth 2 (two) times daily. One po bid x 7 days   [DISCONTINUED] metroNIDAZOLE (FLAGYL) 500 MG tablet Take 1 tablet (500 mg total) by mouth 3 (three) times daily. One po tid x 7 days     Allergies:   Ace inhibitors   Social History   Tobacco Use   Smoking status: Never   Smokeless tobacco: Never  Vaping Use   Vaping Use: Never used  Substance Use Topics   Alcohol use: No    Alcohol/week: 0.0 standard drinks of alcohol   Drug use: Yes    Types: Oxycodone    Comment: Past hx-none in 20 yrs     Family Hx: The patient's family history includes Hypertension in his mother; Kidney disease in his mother.  ROS:   Please see the history of present illness.    Review of Systems  Constitutional: Negative.   Respiratory:  Positive for cough and sputum production. Negative for shortness of breath.   Cardiovascular: Negative.   Gastrointestinal:  Positive for diarrhea.  Neurological: Negative.   Psychiatric/Behavioral: Negative.      All other systems reviewed and are negative.   Labs/Other Tests and Data Reviewed:    Recent Labs: 11/10/2021: TSH 1.560 05/24/2022: B Natriuretic Peptide 86.0 05/25/2022: ALT 8; BUN 33; Creatinine, Ser 8.92; Hemoglobin 10.1; Platelets 188; Potassium 4.2; Sodium 134   Recent Lipid Panel Lab Results  Component Value Date/Time   CHOL 197 03/16/2022 10:18 AM   TRIG 135 03/16/2022 10:18 AM   HDL 59 03/16/2022 10:18 AM  CHOLHDL 3.3 03/16/2022 10:18 AM   CHOLHDL 2.7 06/05/2020 11:11 AM    LDLCALC 114 (H) 03/16/2022 10:18 AM    Wt Readings from Last 3 Encounters:  07/01/22 233 lb (105.7 kg)  05/24/22 237 lb 14 oz (107.9 kg)  03/16/22 237 lb 12.8 oz (107.9 kg)     Exam:    Vital Signs:  There were no vitals taken for this visit.    Physical Exam Vitals reviewed.  Constitutional:      General: He is not in acute distress.    Appearance: Normal appearance. He is obese.  Pulmonary:     Effort: Pulmonary effort is normal. No respiratory distress.  Neurological:     General: No focal deficit present.     Mental Status: He is alert and oriented to person, place, and time. Mental status is at baseline.     Cranial Nerves: No cranial nerve deficit.  Psychiatric:        Mood and Affect: Mood and affect normal.        Behavior: Behavior normal.        Thought Content: Thought content normal.        Cognition and Memory: Memory normal.        Judgment: Judgment normal.     ASSESSMENT & PLAN:     COVID-19 - Will treat with Lagevrio due ESRD. Advised patient to take Vitamin C, D, Zinc.  Keep yourself hydrated with a lot of water and rest. Take Delsym for cough and Mucinex as need. Take Tylenol or pain reliever every 4-6 hours as needed for pain/fever/body ache. If you have elevated blood pressure, you can take OTC Corcidin. You can also take OTC oscillococcinum to help with your symptoms.  Educated patient if symptoms get worse or if she experiences any SOB, chest pain or pain in her legs to seek immediate emergency care. Continue to monitor your oxygen levels. Call us if you have any questions. Quarantine for 5 days if tested positive and no symptoms or 10 days if tested positive and have symptoms. Wear a mask around other people.  - Plan: molnupiravir EUA (LAGEVRIO) 200 mg CAPS capsule  Rotator cuff arthropathy of right shoulder - He is to have shoulder surgery, he is cleared medically. Reports having been cleared with Cardiology  Functional diarrhea - Will try to get him  Xifaxin to see if this helps. Spoke with wife to confirm this is something he is interested in since received info from a different source - Plan: rifaximin (XIFAXAN) 550 MG TABS tablet   COVID-19 Education: The signs and symptoms of COVID-19 were discussed with the patient and how to seek care for testing (follow up with PCP or arrange E-visit).  The importance of social distancing was discussed today.  Patient Risk:   After full review of this patients clinical status, I feel that they are at least moderate risk at this time.  Time:   Today, I have spent 11 minutes/ seconds with the patient with telehealth technology discussing above diagnoses.     Medication Adjustments/Labs and Tests Ordered: Current medicines are reviewed at length with the patient today.  Concerns regarding medicines are outlined above.   Tests Ordered: No orders of the defined types were placed in this encounter.   Medication Changes: Meds ordered this encounter  Medications   molnupiravir EUA (LAGEVRIO) 200 mg CAPS capsule    Sig: Take 4 capsules (800 mg total) by mouth 2 (two) times daily for 5 days.  Dispense:  40 capsule    Refill:  0   rifaximin (XIFAXAN) 550 MG TABS tablet    Sig: Take 1 tablet (550 mg total) by mouth 3 (three) times daily.    Dispense:  42 tablet    Refill:  0    Disposition:  Follow up prn  Signed, [name], FNP

## 2022-07-16 DIAGNOSIS — N186 End stage renal disease: Secondary | ICD-10-CM | POA: Diagnosis not present

## 2022-07-16 DIAGNOSIS — L299 Pruritus, unspecified: Secondary | ICD-10-CM | POA: Diagnosis not present

## 2022-07-16 DIAGNOSIS — E213 Hyperparathyroidism, unspecified: Secondary | ICD-10-CM | POA: Diagnosis not present

## 2022-07-16 DIAGNOSIS — D631 Anemia in chronic kidney disease: Secondary | ICD-10-CM | POA: Diagnosis not present

## 2022-07-16 DIAGNOSIS — N2581 Secondary hyperparathyroidism of renal origin: Secondary | ICD-10-CM | POA: Diagnosis not present

## 2022-07-16 DIAGNOSIS — D689 Coagulation defect, unspecified: Secondary | ICD-10-CM | POA: Diagnosis not present

## 2022-07-16 DIAGNOSIS — Z992 Dependence on renal dialysis: Secondary | ICD-10-CM | POA: Diagnosis not present

## 2022-07-16 DIAGNOSIS — D509 Iron deficiency anemia, unspecified: Secondary | ICD-10-CM | POA: Diagnosis not present

## 2022-07-19 DIAGNOSIS — N186 End stage renal disease: Secondary | ICD-10-CM | POA: Diagnosis not present

## 2022-07-19 DIAGNOSIS — N2581 Secondary hyperparathyroidism of renal origin: Secondary | ICD-10-CM | POA: Diagnosis not present

## 2022-07-19 DIAGNOSIS — E213 Hyperparathyroidism, unspecified: Secondary | ICD-10-CM | POA: Diagnosis not present

## 2022-07-19 DIAGNOSIS — D509 Iron deficiency anemia, unspecified: Secondary | ICD-10-CM | POA: Diagnosis not present

## 2022-07-19 DIAGNOSIS — D689 Coagulation defect, unspecified: Secondary | ICD-10-CM | POA: Diagnosis not present

## 2022-07-19 DIAGNOSIS — L299 Pruritus, unspecified: Secondary | ICD-10-CM | POA: Diagnosis not present

## 2022-07-19 DIAGNOSIS — D631 Anemia in chronic kidney disease: Secondary | ICD-10-CM | POA: Diagnosis not present

## 2022-07-19 DIAGNOSIS — Z992 Dependence on renal dialysis: Secondary | ICD-10-CM | POA: Diagnosis not present

## 2022-07-21 DIAGNOSIS — D631 Anemia in chronic kidney disease: Secondary | ICD-10-CM | POA: Diagnosis not present

## 2022-07-21 DIAGNOSIS — E213 Hyperparathyroidism, unspecified: Secondary | ICD-10-CM | POA: Diagnosis not present

## 2022-07-21 DIAGNOSIS — N2581 Secondary hyperparathyroidism of renal origin: Secondary | ICD-10-CM | POA: Diagnosis not present

## 2022-07-21 DIAGNOSIS — D509 Iron deficiency anemia, unspecified: Secondary | ICD-10-CM | POA: Diagnosis not present

## 2022-07-21 DIAGNOSIS — N186 End stage renal disease: Secondary | ICD-10-CM | POA: Diagnosis not present

## 2022-07-21 DIAGNOSIS — D689 Coagulation defect, unspecified: Secondary | ICD-10-CM | POA: Diagnosis not present

## 2022-07-21 DIAGNOSIS — Z992 Dependence on renal dialysis: Secondary | ICD-10-CM | POA: Diagnosis not present

## 2022-07-21 DIAGNOSIS — L299 Pruritus, unspecified: Secondary | ICD-10-CM | POA: Diagnosis not present

## 2022-07-22 DIAGNOSIS — I120 Hypertensive chronic kidney disease with stage 5 chronic kidney disease or end stage renal disease: Secondary | ICD-10-CM | POA: Diagnosis not present

## 2022-07-22 DIAGNOSIS — Z992 Dependence on renal dialysis: Secondary | ICD-10-CM | POA: Diagnosis not present

## 2022-07-22 DIAGNOSIS — N186 End stage renal disease: Secondary | ICD-10-CM | POA: Diagnosis not present

## 2022-07-23 DIAGNOSIS — E213 Hyperparathyroidism, unspecified: Secondary | ICD-10-CM | POA: Diagnosis not present

## 2022-07-23 DIAGNOSIS — Z992 Dependence on renal dialysis: Secondary | ICD-10-CM | POA: Diagnosis not present

## 2022-07-23 DIAGNOSIS — N186 End stage renal disease: Secondary | ICD-10-CM | POA: Diagnosis not present

## 2022-07-23 DIAGNOSIS — N2581 Secondary hyperparathyroidism of renal origin: Secondary | ICD-10-CM | POA: Diagnosis not present

## 2022-07-26 ENCOUNTER — Encounter: Payer: Self-pay | Admitting: Nurse Practitioner

## 2022-07-26 DIAGNOSIS — N2581 Secondary hyperparathyroidism of renal origin: Secondary | ICD-10-CM | POA: Diagnosis not present

## 2022-07-26 DIAGNOSIS — Z992 Dependence on renal dialysis: Secondary | ICD-10-CM | POA: Diagnosis not present

## 2022-07-26 DIAGNOSIS — N186 End stage renal disease: Secondary | ICD-10-CM | POA: Diagnosis not present

## 2022-07-26 DIAGNOSIS — E213 Hyperparathyroidism, unspecified: Secondary | ICD-10-CM | POA: Diagnosis not present

## 2022-07-28 DIAGNOSIS — N186 End stage renal disease: Secondary | ICD-10-CM | POA: Diagnosis not present

## 2022-07-28 DIAGNOSIS — Z992 Dependence on renal dialysis: Secondary | ICD-10-CM | POA: Diagnosis not present

## 2022-07-28 DIAGNOSIS — N2581 Secondary hyperparathyroidism of renal origin: Secondary | ICD-10-CM | POA: Diagnosis not present

## 2022-07-28 DIAGNOSIS — E213 Hyperparathyroidism, unspecified: Secondary | ICD-10-CM | POA: Diagnosis not present

## 2022-07-30 DIAGNOSIS — N186 End stage renal disease: Secondary | ICD-10-CM | POA: Diagnosis not present

## 2022-07-30 DIAGNOSIS — E213 Hyperparathyroidism, unspecified: Secondary | ICD-10-CM | POA: Diagnosis not present

## 2022-07-30 DIAGNOSIS — Z992 Dependence on renal dialysis: Secondary | ICD-10-CM | POA: Diagnosis not present

## 2022-07-30 DIAGNOSIS — N2581 Secondary hyperparathyroidism of renal origin: Secondary | ICD-10-CM | POA: Diagnosis not present

## 2022-07-30 NOTE — Telephone Encounter (Signed)
Patient seen by video visit

## 2022-08-02 DIAGNOSIS — Z992 Dependence on renal dialysis: Secondary | ICD-10-CM | POA: Diagnosis not present

## 2022-08-02 DIAGNOSIS — N186 End stage renal disease: Secondary | ICD-10-CM | POA: Diagnosis not present

## 2022-08-02 DIAGNOSIS — N2581 Secondary hyperparathyroidism of renal origin: Secondary | ICD-10-CM | POA: Diagnosis not present

## 2022-08-02 DIAGNOSIS — E213 Hyperparathyroidism, unspecified: Secondary | ICD-10-CM | POA: Diagnosis not present

## 2022-08-04 DIAGNOSIS — N186 End stage renal disease: Secondary | ICD-10-CM | POA: Diagnosis not present

## 2022-08-04 DIAGNOSIS — N2581 Secondary hyperparathyroidism of renal origin: Secondary | ICD-10-CM | POA: Diagnosis not present

## 2022-08-04 DIAGNOSIS — Z992 Dependence on renal dialysis: Secondary | ICD-10-CM | POA: Diagnosis not present

## 2022-08-04 DIAGNOSIS — E213 Hyperparathyroidism, unspecified: Secondary | ICD-10-CM | POA: Diagnosis not present

## 2022-08-06 DIAGNOSIS — N186 End stage renal disease: Secondary | ICD-10-CM | POA: Diagnosis not present

## 2022-08-06 DIAGNOSIS — E213 Hyperparathyroidism, unspecified: Secondary | ICD-10-CM | POA: Diagnosis not present

## 2022-08-06 DIAGNOSIS — Z992 Dependence on renal dialysis: Secondary | ICD-10-CM | POA: Diagnosis not present

## 2022-08-06 DIAGNOSIS — N2581 Secondary hyperparathyroidism of renal origin: Secondary | ICD-10-CM | POA: Diagnosis not present

## 2022-08-09 DIAGNOSIS — N2581 Secondary hyperparathyroidism of renal origin: Secondary | ICD-10-CM | POA: Diagnosis not present

## 2022-08-09 DIAGNOSIS — Z992 Dependence on renal dialysis: Secondary | ICD-10-CM | POA: Diagnosis not present

## 2022-08-09 DIAGNOSIS — E213 Hyperparathyroidism, unspecified: Secondary | ICD-10-CM | POA: Diagnosis not present

## 2022-08-09 DIAGNOSIS — N186 End stage renal disease: Secondary | ICD-10-CM | POA: Diagnosis not present

## 2022-08-10 ENCOUNTER — Encounter: Payer: Self-pay | Admitting: Podiatry

## 2022-08-10 ENCOUNTER — Ambulatory Visit (INDEPENDENT_AMBULATORY_CARE_PROVIDER_SITE_OTHER): Payer: Medicare HMO | Admitting: Podiatry

## 2022-08-10 ENCOUNTER — Encounter (HOSPITAL_COMMUNITY): Payer: Self-pay | Admitting: *Deleted

## 2022-08-10 DIAGNOSIS — M79674 Pain in right toe(s): Secondary | ICD-10-CM

## 2022-08-10 DIAGNOSIS — B351 Tinea unguium: Secondary | ICD-10-CM | POA: Diagnosis not present

## 2022-08-10 DIAGNOSIS — M79675 Pain in left toe(s): Secondary | ICD-10-CM

## 2022-08-10 DIAGNOSIS — E119 Type 2 diabetes mellitus without complications: Secondary | ICD-10-CM

## 2022-08-10 NOTE — Progress Notes (Signed)
This patient returns to my office for at risk foot care.  This patient requires this care by a professional since this patient will be at risk due to having DM, chronic anticoagulation and ESRD.  This patient is unable to cut nails himself since the patient cannot reach his nails.These nails are painful walking and wearing shoes.  This patient presents for at risk foot care today.  General Appearance  Alert, conversant and in no acute stress.  Vascular  Dorsalis pedis and posterior tibial  pulses are palpable  bilaterally.  Capillary return is within normal limits  bilaterally. Temperature is within normal limits  bilaterally.  Neurologic  Senn-Weinstein monofilament wire test within normal limits  bilaterally. Muscle power within normal limits bilaterally.  Nails Thick disfigured discolored nails with subungual debris  from hallux to fifth toes bilaterally. No evidence of bacterial infection or drainage bilaterally.  Orthopedic  No limitations of motion  feet .  No crepitus or effusions noted.  No bony pathology or digital deformities noted.  Skin  normotropic skin with no porokeratosis noted bilaterally.  No signs of infections or ulcers noted.     Onychomycosis  Pain in right toes  Pain in left toes  Consent was obtained for treatment procedures.   Mechanical debridement of nails 1-5  bilaterally performed with a nail nipper.  Filed with dremel without incident.    Return office visit     3 months                 Told patient to return for periodic foot care and evaluation due to potential at risk complications.   Cumi Sanagustin DPM   

## 2022-08-11 DIAGNOSIS — N2581 Secondary hyperparathyroidism of renal origin: Secondary | ICD-10-CM | POA: Diagnosis not present

## 2022-08-11 DIAGNOSIS — E213 Hyperparathyroidism, unspecified: Secondary | ICD-10-CM | POA: Diagnosis not present

## 2022-08-11 DIAGNOSIS — Z992 Dependence on renal dialysis: Secondary | ICD-10-CM | POA: Diagnosis not present

## 2022-08-11 DIAGNOSIS — N186 End stage renal disease: Secondary | ICD-10-CM | POA: Diagnosis not present

## 2022-08-13 DIAGNOSIS — N2581 Secondary hyperparathyroidism of renal origin: Secondary | ICD-10-CM | POA: Diagnosis not present

## 2022-08-13 DIAGNOSIS — N186 End stage renal disease: Secondary | ICD-10-CM | POA: Diagnosis not present

## 2022-08-13 DIAGNOSIS — E213 Hyperparathyroidism, unspecified: Secondary | ICD-10-CM | POA: Diagnosis not present

## 2022-08-13 DIAGNOSIS — Z992 Dependence on renal dialysis: Secondary | ICD-10-CM | POA: Diagnosis not present

## 2022-08-16 DIAGNOSIS — N2581 Secondary hyperparathyroidism of renal origin: Secondary | ICD-10-CM | POA: Diagnosis not present

## 2022-08-16 DIAGNOSIS — E213 Hyperparathyroidism, unspecified: Secondary | ICD-10-CM | POA: Diagnosis not present

## 2022-08-16 DIAGNOSIS — N186 End stage renal disease: Secondary | ICD-10-CM | POA: Diagnosis not present

## 2022-08-16 DIAGNOSIS — Z992 Dependence on renal dialysis: Secondary | ICD-10-CM | POA: Diagnosis not present

## 2022-08-18 DIAGNOSIS — E213 Hyperparathyroidism, unspecified: Secondary | ICD-10-CM | POA: Diagnosis not present

## 2022-08-18 DIAGNOSIS — Z992 Dependence on renal dialysis: Secondary | ICD-10-CM | POA: Diagnosis not present

## 2022-08-18 DIAGNOSIS — N186 End stage renal disease: Secondary | ICD-10-CM | POA: Diagnosis not present

## 2022-08-18 DIAGNOSIS — N2581 Secondary hyperparathyroidism of renal origin: Secondary | ICD-10-CM | POA: Diagnosis not present

## 2022-08-19 ENCOUNTER — Encounter (HOSPITAL_COMMUNITY): Payer: Self-pay | Admitting: *Deleted

## 2022-08-19 ENCOUNTER — Other Ambulatory Visit: Payer: Self-pay | Admitting: Orthopedic Surgery

## 2022-08-19 DIAGNOSIS — M25811 Other specified joint disorders, right shoulder: Secondary | ICD-10-CM | POA: Diagnosis not present

## 2022-08-19 DIAGNOSIS — M25812 Other specified joint disorders, left shoulder: Secondary | ICD-10-CM | POA: Diagnosis not present

## 2022-08-20 ENCOUNTER — Encounter (HOSPITAL_COMMUNITY): Payer: Self-pay | Admitting: *Deleted

## 2022-08-20 DIAGNOSIS — N2581 Secondary hyperparathyroidism of renal origin: Secondary | ICD-10-CM | POA: Diagnosis not present

## 2022-08-20 DIAGNOSIS — N186 End stage renal disease: Secondary | ICD-10-CM | POA: Diagnosis not present

## 2022-08-20 DIAGNOSIS — Z992 Dependence on renal dialysis: Secondary | ICD-10-CM | POA: Diagnosis not present

## 2022-08-20 DIAGNOSIS — E213 Hyperparathyroidism, unspecified: Secondary | ICD-10-CM | POA: Diagnosis not present

## 2022-08-22 DIAGNOSIS — I120 Hypertensive chronic kidney disease with stage 5 chronic kidney disease or end stage renal disease: Secondary | ICD-10-CM | POA: Diagnosis not present

## 2022-08-22 DIAGNOSIS — Z992 Dependence on renal dialysis: Secondary | ICD-10-CM | POA: Diagnosis not present

## 2022-08-22 DIAGNOSIS — N186 End stage renal disease: Secondary | ICD-10-CM | POA: Diagnosis not present

## 2022-08-23 DIAGNOSIS — Z992 Dependence on renal dialysis: Secondary | ICD-10-CM | POA: Diagnosis not present

## 2022-08-23 DIAGNOSIS — N186 End stage renal disease: Secondary | ICD-10-CM | POA: Diagnosis not present

## 2022-08-23 DIAGNOSIS — N2581 Secondary hyperparathyroidism of renal origin: Secondary | ICD-10-CM | POA: Diagnosis not present

## 2022-08-23 DIAGNOSIS — E213 Hyperparathyroidism, unspecified: Secondary | ICD-10-CM | POA: Diagnosis not present

## 2022-08-25 DIAGNOSIS — E213 Hyperparathyroidism, unspecified: Secondary | ICD-10-CM | POA: Diagnosis not present

## 2022-08-25 DIAGNOSIS — N186 End stage renal disease: Secondary | ICD-10-CM | POA: Diagnosis not present

## 2022-08-25 DIAGNOSIS — Z992 Dependence on renal dialysis: Secondary | ICD-10-CM | POA: Diagnosis not present

## 2022-08-25 DIAGNOSIS — N2581 Secondary hyperparathyroidism of renal origin: Secondary | ICD-10-CM | POA: Diagnosis not present

## 2022-08-27 ENCOUNTER — Encounter (HOSPITAL_COMMUNITY): Payer: Self-pay | Admitting: *Deleted

## 2022-08-27 ENCOUNTER — Ambulatory Visit
Admission: RE | Admit: 2022-08-27 | Discharge: 2022-08-27 | Disposition: A | Discharge status: Home / Self Care | Payer: Medicare HMO | Source: Ambulatory Visit | Attending: Orthopedic Surgery | Admitting: Orthopedic Surgery

## 2022-08-27 DIAGNOSIS — M25811 Other specified joint disorders, right shoulder: Secondary | ICD-10-CM

## 2022-08-27 DIAGNOSIS — N2581 Secondary hyperparathyroidism of renal origin: Secondary | ICD-10-CM | POA: Diagnosis not present

## 2022-08-27 DIAGNOSIS — Z992 Dependence on renal dialysis: Secondary | ICD-10-CM | POA: Diagnosis not present

## 2022-08-27 DIAGNOSIS — N186 End stage renal disease: Secondary | ICD-10-CM | POA: Diagnosis not present

## 2022-08-27 DIAGNOSIS — M25511 Pain in right shoulder: Secondary | ICD-10-CM | POA: Diagnosis not present

## 2022-08-27 DIAGNOSIS — E213 Hyperparathyroidism, unspecified: Secondary | ICD-10-CM | POA: Diagnosis not present

## 2022-08-30 DIAGNOSIS — E213 Hyperparathyroidism, unspecified: Secondary | ICD-10-CM | POA: Diagnosis not present

## 2022-08-30 DIAGNOSIS — N186 End stage renal disease: Secondary | ICD-10-CM | POA: Diagnosis not present

## 2022-08-30 DIAGNOSIS — Z992 Dependence on renal dialysis: Secondary | ICD-10-CM | POA: Diagnosis not present

## 2022-08-30 DIAGNOSIS — N2581 Secondary hyperparathyroidism of renal origin: Secondary | ICD-10-CM | POA: Diagnosis not present

## 2022-08-31 ENCOUNTER — Ambulatory Visit: Payer: Medicare Other | Admitting: Podiatry

## 2022-09-01 DIAGNOSIS — N2581 Secondary hyperparathyroidism of renal origin: Secondary | ICD-10-CM | POA: Diagnosis not present

## 2022-09-01 DIAGNOSIS — N186 End stage renal disease: Secondary | ICD-10-CM | POA: Diagnosis not present

## 2022-09-01 DIAGNOSIS — E213 Hyperparathyroidism, unspecified: Secondary | ICD-10-CM | POA: Diagnosis not present

## 2022-09-01 DIAGNOSIS — Z992 Dependence on renal dialysis: Secondary | ICD-10-CM | POA: Diagnosis not present

## 2022-09-02 DIAGNOSIS — M19011 Primary osteoarthritis, right shoulder: Secondary | ICD-10-CM | POA: Diagnosis not present

## 2022-09-03 DIAGNOSIS — N186 End stage renal disease: Secondary | ICD-10-CM | POA: Diagnosis not present

## 2022-09-03 DIAGNOSIS — E213 Hyperparathyroidism, unspecified: Secondary | ICD-10-CM | POA: Diagnosis not present

## 2022-09-03 DIAGNOSIS — Z992 Dependence on renal dialysis: Secondary | ICD-10-CM | POA: Diagnosis not present

## 2022-09-03 DIAGNOSIS — N2581 Secondary hyperparathyroidism of renal origin: Secondary | ICD-10-CM | POA: Diagnosis not present

## 2022-09-06 DIAGNOSIS — Z992 Dependence on renal dialysis: Secondary | ICD-10-CM | POA: Diagnosis not present

## 2022-09-06 DIAGNOSIS — N2581 Secondary hyperparathyroidism of renal origin: Secondary | ICD-10-CM | POA: Diagnosis not present

## 2022-09-06 DIAGNOSIS — E213 Hyperparathyroidism, unspecified: Secondary | ICD-10-CM | POA: Diagnosis not present

## 2022-09-06 DIAGNOSIS — N186 End stage renal disease: Secondary | ICD-10-CM | POA: Diagnosis not present

## 2022-09-08 DIAGNOSIS — E213 Hyperparathyroidism, unspecified: Secondary | ICD-10-CM | POA: Diagnosis not present

## 2022-09-08 DIAGNOSIS — N186 End stage renal disease: Secondary | ICD-10-CM | POA: Diagnosis not present

## 2022-09-08 DIAGNOSIS — Z992 Dependence on renal dialysis: Secondary | ICD-10-CM | POA: Diagnosis not present

## 2022-09-08 DIAGNOSIS — N2581 Secondary hyperparathyroidism of renal origin: Secondary | ICD-10-CM | POA: Diagnosis not present

## 2022-09-10 DIAGNOSIS — N186 End stage renal disease: Secondary | ICD-10-CM | POA: Diagnosis not present

## 2022-09-10 DIAGNOSIS — N2581 Secondary hyperparathyroidism of renal origin: Secondary | ICD-10-CM | POA: Diagnosis not present

## 2022-09-10 DIAGNOSIS — E213 Hyperparathyroidism, unspecified: Secondary | ICD-10-CM | POA: Diagnosis not present

## 2022-09-10 DIAGNOSIS — Z992 Dependence on renal dialysis: Secondary | ICD-10-CM | POA: Diagnosis not present

## 2022-09-11 ENCOUNTER — Other Ambulatory Visit: Payer: Self-pay

## 2022-09-11 ENCOUNTER — Emergency Department (HOSPITAL_BASED_OUTPATIENT_CLINIC_OR_DEPARTMENT_OTHER)
Admission: EM | Admit: 2022-09-11 | Discharge: 2022-09-11 | Disposition: A | Discharge status: Home / Self Care | Payer: Medicare HMO | Attending: Emergency Medicine | Admitting: Emergency Medicine

## 2022-09-11 ENCOUNTER — Emergency Department (HOSPITAL_BASED_OUTPATIENT_CLINIC_OR_DEPARTMENT_OTHER): Payer: Medicare HMO | Admitting: Radiology

## 2022-09-11 ENCOUNTER — Encounter (HOSPITAL_COMMUNITY): Payer: Self-pay | Admitting: *Deleted

## 2022-09-11 ENCOUNTER — Encounter (HOSPITAL_BASED_OUTPATIENT_CLINIC_OR_DEPARTMENT_OTHER): Payer: Self-pay | Admitting: Emergency Medicine

## 2022-09-11 DIAGNOSIS — E1122 Type 2 diabetes mellitus with diabetic chronic kidney disease: Secondary | ICD-10-CM | POA: Insufficient documentation

## 2022-09-11 DIAGNOSIS — M1611 Unilateral primary osteoarthritis, right hip: Secondary | ICD-10-CM | POA: Diagnosis not present

## 2022-09-11 DIAGNOSIS — M1612 Unilateral primary osteoarthritis, left hip: Secondary | ICD-10-CM | POA: Insufficient documentation

## 2022-09-11 DIAGNOSIS — N186 End stage renal disease: Secondary | ICD-10-CM | POA: Insufficient documentation

## 2022-09-11 DIAGNOSIS — Z7901 Long term (current) use of anticoagulants: Secondary | ICD-10-CM | POA: Diagnosis not present

## 2022-09-11 DIAGNOSIS — Z992 Dependence on renal dialysis: Secondary | ICD-10-CM | POA: Insufficient documentation

## 2022-09-11 DIAGNOSIS — M85852 Other specified disorders of bone density and structure, left thigh: Secondary | ICD-10-CM | POA: Diagnosis not present

## 2022-09-11 DIAGNOSIS — M85851 Other specified disorders of bone density and structure, right thigh: Secondary | ICD-10-CM

## 2022-09-11 DIAGNOSIS — M1610 Unilateral primary osteoarthritis, unspecified hip: Secondary | ICD-10-CM

## 2022-09-11 DIAGNOSIS — M25552 Pain in left hip: Secondary | ICD-10-CM | POA: Diagnosis present

## 2022-09-11 DIAGNOSIS — M13859 Other specified arthritis, unspecified hip: Secondary | ICD-10-CM | POA: Diagnosis not present

## 2022-09-11 MED ORDER — LIDOCAINE 5 % EX PTCH
1.0000 | MEDICATED_PATCH | CUTANEOUS | Status: DC
  Administered 2022-09-11: 1 via TRANSDERMAL
  Filled 2022-09-11: qty 1

## 2022-09-11 MED ORDER — ACETAMINOPHEN 325 MG PO TABS
650.0000 mg | ORAL_TABLET | Freq: Once | ORAL | Status: AC
  Administered 2022-09-11: 650 mg via ORAL
  Filled 2022-09-11: qty 2

## 2022-09-11 MED ORDER — LIDOCAINE 5 % EX PTCH: 1.0000 | MEDICATED_PATCH | CUTANEOUS | 0 refills | Status: DC

## 2022-09-11 NOTE — Discharge Instructions (Addendum)
Please make an appointment with your orthopedic specialist in regards to recent ER visit and symptoms.  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  The rest of your physical exam was reassuring and you may take Tylenol every 6 hours as needed for pain.  I will also prescribe you lidocaine patches for topical relief.  If symptoms change or worsen please return to the ER.

## 2022-09-11 NOTE — ED Triage Notes (Signed)
Pt arrives to ED with c/o left upper leg/hip pain that started today while pt was getting off the commode.

## 2022-09-11 NOTE — ED Provider Notes (Cosign Needed Addendum)
[place] EMERGENCY DEPARTMENT AT [facility] Provider Note   CSN: [id number] Arrival date & time: 09/11/22  1635     History  Chief Complaint  Patient presents with   Leg Pain    [name] is a 62 y.o. male history of CVA on warfarin, A-fib, type 2 diabetes, osteoarthritis, and stage renal disease on hemodialysis presented with left hip pain that began today.  Patient was on the commode when after having a bowel movement he tried stand up but had difficulty and had a sharp pain in his left hip.  Patient states since then pain has been present when he tries to move his left hip and states he heard a pop and grinding sensation.  Patient has not tried any pain meds.  Patient notes that he has severe osteoarthritis in bilateral knees.  Patient endorses sensation distally and denies any recent illness or skin color changes.  Patient states he has been able to walk since then but endorses left hip pain when he does walk.  Patient denied chest pain, shortness of breath, abdominal pain, dysuria, fever/chills, denied urinary/bowel incontinence, saddle anesthesia, recent trauma  Home Medications Prior to Admission medications   Medication Sig Start Date End Date Taking? Authorizing Provider  lidocaine (LIDODERM) 5 % Place 1 patch onto the skin daily. Remove & Discard patch within 12 hours or as directed by MD 09/11/22  Yes Inetha Maret, Beverly Gust, PA-C  albuterol (VENTOLIN HFA) 108 (90 Base) MCG/ACT inhaler Inhale 2 puffs into the lungs every 6 (six) hours as needed for wheezing or shortness of breath. 09/18/19   [name], FNP  calcium acetate (PHOSLO) 667 MG capsule Take 1-3 capsules (667-2,001 mg total) by mouth See admin instructions. Take 2001 mg capsules with meals three times daily and 667 mg capsule with snacks. 11/23/18   Orpah Cobb, MD  chlorhexidine (PERIDEX) 0.12 % solution 15 mLs by Mouth Rinse route 2 (two) times daily. 05/29/18   [provider]  cinacalcet (SENSIPAR)  90 MG tablet Take 180 mg by mouth daily.    [provider]  Control Gel Formula Dressing (DUODERM CGF EXTRA THIN) MISC Apply 1 each topically daily as needed. 10/26/20   [name], FNP  diclofenac Sodium (VOLTAREN) 1 % GEL Apply 2 g topically 4 (four) times daily. Patient not taking: Reported on 09/10/2022 11/15/19   [name], FNP  digoxin (LANOXIN) 0.125 MG tablet Take 1 tablet (0.125 mg total) by mouth every Monday, Wednesday, and Friday. 06/09/20   Cathren Harsh, MD  Etelcalcetide HCl (PARSABIV IV) Etelcalcetide Brigitte Pulse) 07/05/22 06/15/23  [provider]  gabapentin (NEURONTIN) 300 MG capsule Take 1 capsule (300 mg total) by mouth 3 (three) times daily. Patient taking differently: Take 300 mg by mouth at bedtime. 11/10/21   [name], FNP  glucose blood test strip Use to check blood sugar 3 times a day. Dx code e11.65 10/13/21   [name], FNP  HYDROcodone-acetaminophen (NORCO) 10-325 MG tablet Take 0.5 tablets by mouth 2 (two) times daily as needed for moderate pain. 06/17/22   [provider]  ipratropium (ATROVENT) 0.06 % nasal spray Place 2 sprays into both nostrils 4 (four) times daily. Patient not taking: Reported on 09/10/2022 11/15/19   [name], FNP  isosorbide dinitrate (ISORDIL) 10 MG tablet Take 10 mg by mouth 2 (two) times daily. 07/07/21   [provider]  Lactobacillus (ACIDOPHILUS/BIFIDUS) WAFR Take 1 Wafer by mouth daily. Patient not taking: Reported on 09/10/2022 07/01/22  [name], FNP  Lancets [facility] DELICA PLUS [place]) MISC Use to check blood sugar 3 times a day. Dx code e11.65 10/13/21   [name], FNP  loperamide (IMODIUM A-D) 2 MG tablet Take 1 tablet (2 mg total) by mouth 4 (four) times daily as needed for diarrhea or loose stools. Also available OTC. 06/09/20   Rai, Delene Ruffini, MD  meclizine (ANTIVERT) 25 MG tablet Take 1 tablet (25 mg total) by mouth 3 (three) times daily as needed for dizziness. 04/08/21    Horton, Clabe Seal, DO  metoprolol succinate (TOPROL-XL) 25 MG 24 hr tablet Take 1 tablet (25 mg total) by mouth daily. 06/09/20   Rai, Delene Ruffini, MD  metoprolol tartrate (LOPRESSOR) 25 MG tablet Take 25 mg by mouth 2 (two) times daily. 10/03/20   [provider]  midodrine (PROAMATINE) 10 MG tablet Take 1 tablet (10 mg total) by mouth 3 (three) times daily. 06/09/20   Rai, Delene Ruffini, MD  multivitamin (RENA-VIT) TABS tablet Take 1 tablet by mouth at bedtime.    [provider]  nitroGLYCERIN (NITROSTAT) 0.4 MG SL tablet Place 0.4 mg under the tongue every 5 (five) minutes as needed for chest pain.  05/20/15   [provider]  Nutritional Supplements (FEEDING SUPPLEMENT, NEPRO CARB STEADY,) LIQD Take 237 mLs by mouth daily. 03/22/16   [provider]  ondansetron (ZOFRAN ODT) 4 MG disintegrating tablet Take 1 tablet (4 mg total) by mouth every 8 (eight) hours as needed for nausea or vomiting. 06/09/20   Rai, Delene Ruffini, MD  oxyCODONE-acetaminophen (PERCOCET/ROXICET) 5-325 MG tablet Take 1 tablet by mouth every 4 (four) hours as needed for severe pain. 11/10/21   [name], FNP  pantoprazole (PROTONIX) 40 MG tablet Take 40 mg by mouth 2 (two) times daily.    [provider]  rifaximin (XIFAXAN) 550 MG TABS tablet Take 1 tablet (550 mg total) by mouth 3 (three) times daily. 07/15/22   [name], FNP  sucroferric oxyhydroxide (VELPHORO) 500 MG chewable tablet Chew 500-1,000 mg by mouth See admin instructions. Takes 2 tablets with each meal and 1 tablet with snacks. 10/02/18   [provider]  UNABLE TO FIND Out patient physical therapy- vestibular therapy for BPPV 08/01/18   Calvert Cantor, MD  warfarin (COUMADIN) 7.5 MG tablet Take 7.5 mg by mouth daily.    [provider]      Allergies    Ace inhibitors    Review of Systems   Review of Systems See HPI Physical Exam Updated Vital Signs BP (!) 99/57 (BP Location: Right Arm)   Pulse  100   Temp 98.4 F (36.9 C)   Resp 20   Ht  (1.854 m)   Wt 111.1 kg   SpO2 99%   BMI 32.32 kg/m  Physical Exam Constitutional:      General: He is not in acute distress. Cardiovascular:     Comments: 2+ bilateral dorsalis pedal pulses with regular rate Abdominal:     General: There is no distension.     Palpations: Abdomen is soft. There is no mass.     Tenderness: There is no abdominal tenderness. There is no right CVA tenderness, left CVA tenderness, guarding or rebound.  Musculoskeletal:     Right lower leg: No edema.     Left lower leg: No edema.     Comments: Pelvis: Stable when palpated, no step-off/crepitus/abdomen is palpated No midline tenderness or step-off/crepitus/abnormalities palpated No paralumbar muscle tenderness to palpation  4 out of 5 bilateral hip flexion  Skin:    General: Skin is warm and dry.     Capillary Refill: Capillary refill takes less than 2 seconds.     Comments: No overlying skin color changes  Neurological:     Mental Status: He is alert.     Comments: Sensation intact distally in all 4 limbs  Psychiatric:        Mood and Affect: Mood normal.     ED Results / Procedures / Treatments   Labs (all labs ordered are listed, but only abnormal results are displayed) Labs Reviewed - No data to display  EKG None  Radiology DG Hip Unilat W or Wo Pelvis 2-3 Views Left  Result Date: 09/11/2022 CLINICAL DATA:  Pain EXAM: DG HIP (WITH OR WITHOUT PELVIS) 3V LEFT COMPARISON:  None Available. FINDINGS: Osteopenia. Moderate concentric joint space loss of the hips. No fracture or dislocation. Scattered vascular calcifications. IMPRESSION: Degenerative changes of the hips with concentric joint space loss. Osteopenia. Electronically Signed   By: Karen Kays M.D.   On: 09/11/2022 17:26    Procedures Procedures    Medications Ordered in ED Medications  acetaminophen (TYLENOL) tablet 650 mg (has no administration in time range)  lidocaine  (LIDODERM) 5 % 1 patch (has no administration in time range)    ED Course/ Medical Decision Making/ A&P                             Medical Decision Making Amount and/or Complexity of Data Reviewed Radiology: ordered.  Risk OTC drugs. Prescription drug management.   [name] 62 y.o. presented today for left hip pain. Working DDx that I considered at this time includes, but not limited to, osteoarthritis/osteopenia , pelvic fracture, neurovascular compromise, ischemic limb, AAA, UTI, nephrolithiasis/pyelonephritis, muscle strain.  R/o DDx: pelvic fracture, neurovascular compromise, ischemic limb, AAA, UTI, nephrolithiasis/pyelonephritis, muscle strain: These are considered less likely due to history of present illness and physical exam findings  Review of prior external notes: 05/24/2022 ED  Unique Tests and My Interpretation:  Pelvis x-ray: No acute fractures, osteoarthritis noted along with osteopenia  Discussion with Independent Historian:  Family  Discussion of Management of Tests: None  Risk: Medium: prescription drug management  Risk Stratification Score: None  Plan: Patient presented for left hip pain. On exam patient was in no acute distress and stable vitals.  Patient was able to range his left hip equally with his right hip and had good pulses motor and sensation distally.  When palpating the pelvis pelvis was stable and did not have any fall/crepitus/abnormalities.  The rest of the patient's exam was unremarkable.  Patient states he has been walking since he experiences pain this morning and endorses pain when he walks and that he feels a popping and grinding sensation.  X-ray showed osteoarthritis in his pelvis along with osteopenia which would explain patient's symptoms.  Patient also has history of osteoarthritis in bilateral knees.  Patient was given Tylenol in the ED and Lidoderm for topical relief and given prescription for Lidoderm.  I strongly encouraged  patient to follow-up with his orthopedic specialist regarding recent symptoms and ER visit as he may need a hip replacement or may need to be monitored long-term for his hip pain.  Patient and family member verbalized agreement to this plan.  Patient was given return precautions. Patient stable for discharge at this time.  Patient verbalized understanding of plan.  Final Clinical Impression(s) / ED Diagnoses Final diagnoses:  Osteoarthritis of pelvic region  Osteopenia of both hips    Rx / DC Orders ED Discharge Orders          Ordered    lidocaine (LIDODERM) 5 %  Every 24 hours        09/11/22 1807              Remi Deter 09/11/22 1818    Long, Arlyss Repress, MD 09/12/22 1126

## 2022-09-13 ENCOUNTER — Telehealth: Payer: Self-pay

## 2022-09-13 DIAGNOSIS — N186 End stage renal disease: Secondary | ICD-10-CM | POA: Diagnosis not present

## 2022-09-13 DIAGNOSIS — N2581 Secondary hyperparathyroidism of renal origin: Secondary | ICD-10-CM | POA: Diagnosis not present

## 2022-09-13 DIAGNOSIS — Z992 Dependence on renal dialysis: Secondary | ICD-10-CM | POA: Diagnosis not present

## 2022-09-13 DIAGNOSIS — E213 Hyperparathyroidism, unspecified: Secondary | ICD-10-CM | POA: Diagnosis not present

## 2022-09-13 NOTE — Progress Notes (Signed)
Surgical Instructions    Your procedure is scheduled on Monday April 29.  Report to [facility] Main Entrance "A" at 1030 A.M., then check in with the Admitting office.  Call this number if you have problems the morning of surgery:  [phone]   If you have any questions prior to your surgery date call [phone]: Open Monday-Friday 8am-4pm If you experience any cold or flu symptoms such as cough, fever, chills, shortness of breath, etc. between now and your scheduled surgery, please notify us at the above number     Remember:  Do not eat after midnight the night before your surgery  You may drink clear liquids until 9:30am the morning of your surgery.   Clear liquids allowed are: Water, Non-Citrus Juices (without pulp), Carbonated Beverages, Clear Tea, Black Coffee ONLY (NO MILK, CREAM OR POWDERED CREAMER of any kind), and Gatorade Patient Instructions  The night before surgery:  No food after midnight. ONLY clear liquids after midnight  The day of surgery (if you have diabetes): Drink ONE (1) 12 oz G2 given to you in your pre admission testing appointment by 9:30am the morning of surgery. Drink in one sitting. Do not sip.  This drink was given to you during your hospital  pre-op appointment visit.  Nothing else to drink after completing the  12 oz bottle of G2.         If you have questions, please contact your surgeon's office.    Take these medicines the morning of surgery with A SIP OF WATER:  albuterol (VENTOLIN HFA) 108 (90 Base) MCG/ACT inhaler if needed digoxin (LANOXIN)  isosorbide dinitrate (ISORDIL)  meclizine (ANTIVERT) if needed metoprolol tartrate (LOPRESSOR)  midodrine (PROAMATINE)  nitroGLYCERIN (NITROSTAT) if needed ondansetron (ZOFRAN ODT) if needed oxyCODONE-acetaminophen (PERCOCET/ROXICET) if needed pantoprazole (PROTONIX)  rifaximin (XIFAXAN)   As of today, STOP taking any Aspirin (unless otherwise instructed by your surgeon) Aleve, Naproxen,  Ibuprofen, Motrin, Advil, Goody's, BC's, all herbal medications, fish oil, and all vitamins.  Follow your physician's instructions on when to stop coumadin prior to surgery.    HOW TO MANAGE YOUR DIABETES BEFORE AND AFTER SURGERY  Why is it important to control my blood sugar before and after surgery? Improving blood sugar levels before and after surgery helps healing and can limit problems. A way of improving blood sugar control is eating a healthy diet by:  Eating less sugar and carbohydrates  Increasing activity/exercise  Talking with your doctor about reaching your blood sugar goals High blood sugars (greater than 180 mg/dL) can raise your risk of infections and slow your recovery, so you will need to focus on controlling your diabetes during the weeks before surgery. Make sure that the doctor who takes care of your diabetes knows about your planned surgery including the date and location.  How do I manage my blood sugar before surgery? Check your blood sugar at least 4 times a day, starting 2 days before surgery, to make sure that the level is not too high or low.  Check your blood sugar the morning of your surgery when you wake up and every 2 hours until you get to the Short Stay unit.  If your blood sugar is less than 70 mg/dL, you will need to treat for low blood sugar: Do not take insulin. Treat a low blood sugar (less than 70 mg/dL) with  cup of clear juice (cranberry or apple), 4 glucose tablets, OR glucose gel. Recheck blood sugar in 15 minutes after treatment (to  make sure it is greater than 70 mg/dL). If your blood sugar is not greater than 70 mg/dL on recheck, call [phone] for further instructions. Report your blood sugar to the short stay nurse when you get to Short Stay.  If you are admitted to the hospital after surgery: Your blood sugar will be checked by the staff and you will probably be given insulin after surgery (instead of oral diabetes medicines) to make  sure you have good blood sugar levels. The goal for blood sugar control after surgery is 80-180 mg/dL.          Do not wear jewelry or makeup. Do not wear lotions, powders, perfumes/cologne or deodorant. Do not shave 48 hours prior to surgery.  Men may shave face and neck. Do not bring valuables to the hospital. Do not wear nail polish, gel polish, artificial nails, or any other type of covering on natural nails (fingers and toes) If you have artificial nails or gel coating that need to be removed by a nail salon, please have this removed prior to surgery. Artificial nails or gel coating may interfere with anesthesia's ability to adequately monitor your vital signs.  [facility] is not responsible for any belongings or valuables.    Do NOT Smoke (Tobacco/Vaping)  24 hours prior to your procedure  If you use a CPAP at night, you may bring your mask for your overnight stay.   Contacts, glasses, hearing aids, dentures or partials may not be worn into surgery, please bring cases for these belongings   For patients admitted to the hospital, discharge time will be determined by your treatment team.   Patients discharged the day of surgery will not be allowed to drive home, and someone needs to stay with them for 24 hours.   SURGICAL WAITING ROOM VISITATION Patients having surgery or a procedure may have no more than 2 support people in the waiting area - these visitors may rotate.   Children under the age of 103 must have an adult with them who is not the patient. If the patient needs to stay at the hospital during part of their recovery, the visitor guidelines for inpatient rooms apply. Pre-op nurse will coordinate an appropriate time for 1 support person to accompany patient in pre-op.  This support person may not rotate.   Please refer to [email or web address] for the visitor guidelines for Inpatients (after your surgery is over and you are in a  regular room).    Special instructions:    Oral Hygiene is also important to reduce your risk of infection.  Remember - BRUSH YOUR TEETH THE MORNING OF SURGERY WITH YOUR REGULAR TOOTHPASTE   [place]- Preparing For Surgery  Before surgery, you can play an important role. Because skin is not sterile, your skin needs to be as free of germs as possible. You can reduce the number of germs on your skin by washing with CHG (chlorahexidine gluconate) Soap before surgery.  CHG is an antiseptic cleaner which kills germs and bonds with the skin to continue killing germs even after washing.     Please do not use if you have an allergy to CHG or antibacterial soaps. If your skin becomes reddened/irritated stop using the CHG.  Do not shave (including legs and underarms) for at least 48 hours prior to first CHG shower. It is OK to shave your face.  Please follow these instructions carefully.  [place]- Preparing for Total Shoulder Arthroplasty   Before surgery, you can play an important role. Because skin is not sterile, your skin needs to be as free of germs as possible. You can reduce the number of germs on your skin by using the following products. Benzoyl Peroxide Gel Reduces the number of germs present on the skin Applied twice a day to shoulder area starting two days before surgery   Chlorhexidine Gluconate (CHG) Soap An antiseptic cleaner that kills germs and bonds with the skin to continue killing germs even after washing Used for showering the night before surgery and morning of surgery   Oral Hygiene is also important to reduce your risk of infection.                                    Remember - BRUSH YOUR TEETH THE MORNING OF SURGERY WITH YOUR REGULAR TOOTHPASTE  ==================================================================  Please follow these instructions carefully:  BENZOYL PEROXIDE 5% GEL  Please do not use if you have an allergy to  benzoyl peroxide.   If your skin becomes reddened/irritated stop using the benzoyl peroxide.  Starting two days before surgery, apply as follows: Apply benzoyl peroxide in the morning and at night. Apply after taking a shower. If you are not taking a shower clean entire shoulder front, back, and side along with the armpit with a clean wet washcloth.  Place a quarter-sized dollop on your shoulder and rub in thoroughly, making sure to cover the front, back, and side of your shoulder, along with the armpit.   2 days before ____ AM   ____ PM              1 day before ____ AM   ____ PM                             Do this twice a day for two days.  (Last application is the night before surgery, AFTER using the CHG soap as described below).  Do NOT apply benzoyl peroxide gel on the day of surgery.  CHLORHEXIDINE GLUCONATE (CHG) SOAP  Please do not use if you have an allergy to CHG or antibacterial soaps. If your skin becomes reddened/irritated stop using the CHG.   Do not shave (including legs and underarms) for at least 48 hours prior to first CHG shower. It is OK to shave your face.  Starting the night before surgery, use CHG soap as follows:  Shower the NIGHT BEFORE SURGERY and MORNING OF SURGERY with CHG.  If you choose to wash your hair, wash your hair first as usual with your normal shampoo.  After shampooing, rinse your hair and body thoroughly to remove the shampoo.  Use CHG as you would any other liquid soap.  You can apply CHG directly to the skin and wash gently with a scrungie or a clean washcloth.  Apply the CHG soap to your body ONLY FROM THE NECK DOWN.  Do not use on open wounds or open sores.  Avoid contact with your eyes, ears, mouth, and genitals (private parts).  Wash face and genitals (private parts) with your normal soap.  Wash thoroughly, paying special attention to the area where your surgery will be performed.  Thoroughly rinse your body with warm water from the  neck down.  DO NOT shower/wash with your normal soap after using  and rinsing off the CHG soap.   Pat yourself dry with a CLEAN TOWEL.    Apply benzoyl peroxide.   Wear CLEAN PAJAMAS to bed the night before surgery; wear comfortable clothes the morning of surgery.  Place CLEAN SHEETS on your bed the night of your first shower and DO NOT SLEEP WITH PETS.  Day of Surgery: Shower as above Do not apply any deodorants/lotions.  Please wear clean clothes to the hospital/surgery center.   Remember to brush your teeth WITH YOUR REGULAR TOOTHPASTE.       Shower the NIGHT BEFORE SURGERY and the MORNING OF SURGERY with CHG Soap.   If you chose to wash your hair, wash your hair first as usual with your normal shampoo. After you shampoo, rinse your hair and body thoroughly to remove the shampoo.  Then [employer] and genitals (private parts) with your normal soap and rinse thoroughly to remove soap.  After that Use CHG Soap as you would any other liquid soap. You can apply CHG directly to the skin and wash gently with a scrungie or a clean washcloth.   Apply the CHG Soap to your body ONLY FROM THE NECK DOWN.  Do not use on open wounds or open sores. Avoid contact with your eyes, ears, mouth and genitals (private parts). Wash Face and genitals (private parts)  with your normal soap.   Wash thoroughly, paying special attention to the area where your surgery will be performed.  Thoroughly rinse your body with warm water from the neck down.  DO NOT shower/wash with your normal soap after using and rinsing off the CHG Soap.  Pat yourself dry with a CLEAN TOWEL.  Wear CLEAN PAJAMAS to bed the night before surgery  Place CLEAN SHEETS on your bed the night before your surgery  DO NOT SLEEP WITH PETS.   Day of Surgery:  Take a shower with CHG soap. Wear Clean/Comfortable clothing the morning of surgery Do not apply any deodorants/lotions.   Remember to brush your teeth WITH YOUR REGULAR  TOOTHPASTE.    If you received a COVID test during your pre-op visit, it is requested that you wear a mask when out in public, stay away from anyone that may not be feeling well, and notify your surgeon if you develop symptoms. If you have been in contact with anyone that has tested positive in the last 10 days, please notify your surgeon.    Please read over the following fact sheets that you were given.

## 2022-09-13 NOTE — Telephone Encounter (Signed)
Patient's wife called to inquire about getting a lift for the patient. She states he is having significant hip pain and is unable to transfer himself.   Please advise, thank you!

## 2022-09-13 NOTE — Patient Outreach (Signed)
  Care Coordination    09/13/2022 Name: [name] MRN: [id number] DOB: [birth year]  [name] is a 62 y.o. year old male who sees Matthew Felts, FNP for primary care. I  collaborated with patients primary care providers office to offer feedback on resource needs related to obtaining a lift in the home. SW has previously referred the patient to [employer] for assistance with a ramp. Advised providers office patient could try to see if [facility] will assist with needed modifications, number provided to providers office. Advised a list may have to be ordered through insurance.    SDOH assessments and interventions completed:  No     Care Coordination Interventions:  Yes, provided   Interventions Today    Flowsheet Row Most Recent Value  Chronic Disease   Chronic disease during today's visit Hypertension (HTN), Congestive Heart Failure (CHF), Diabetes  General Interventions   General Interventions Discussed/Reviewed [facility], Communication with  Communication with PCP/Specialists        Follow up plan: No further intervention required. The patients providers office will provide education on resource options available to the patient.    Encounter Outcome:  Pt. Visit Completed   [name], BSW, CDP Social Worker, Certified Dementia Practitioner [facility]  Care Coordination [phone]

## 2022-09-13 NOTE — Telephone Encounter (Signed)
From [facility]: No problem! A lift will most likely need to be ordered through DME and they will be responsible for the copay. [employer] is who I referred them to and they can help with home modifications if he qualifies. It is a long process because they are a small agency. They can also try contacting [facility] for help. Their number is [phone]

## 2022-09-14 ENCOUNTER — Encounter (HOSPITAL_COMMUNITY): Payer: Self-pay

## 2022-09-14 ENCOUNTER — Other Ambulatory Visit: Payer: Self-pay

## 2022-09-14 ENCOUNTER — Encounter (HOSPITAL_COMMUNITY)
Admission: RE | Admit: 2022-09-14 | Discharge: 2022-09-14 | Disposition: A | Discharge status: Home / Self Care | Payer: Medicare HMO | Source: Ambulatory Visit | Attending: Orthopedic Surgery | Admitting: Orthopedic Surgery

## 2022-09-14 VITALS — BP 114/66 | HR 99 | Temp 98.3°F | Resp 20 | Ht 73.0 in | Wt 243.0 lb

## 2022-09-14 DIAGNOSIS — Z8673 Personal history of transient ischemic attack (TIA), and cerebral infarction without residual deficits: Secondary | ICD-10-CM | POA: Diagnosis not present

## 2022-09-14 DIAGNOSIS — I4892 Unspecified atrial flutter: Secondary | ICD-10-CM | POA: Diagnosis not present

## 2022-09-14 DIAGNOSIS — I5022 Chronic systolic (congestive) heart failure: Secondary | ICD-10-CM | POA: Insufficient documentation

## 2022-09-14 DIAGNOSIS — I340 Nonrheumatic mitral (valve) insufficiency: Secondary | ICD-10-CM | POA: Diagnosis not present

## 2022-09-14 DIAGNOSIS — Z01812 Encounter for preprocedural laboratory examination: Secondary | ICD-10-CM | POA: Diagnosis not present

## 2022-09-14 DIAGNOSIS — I132 Hypertensive heart and chronic kidney disease with heart failure and with stage 5 chronic kidney disease, or end stage renal disease: Secondary | ICD-10-CM | POA: Insufficient documentation

## 2022-09-14 DIAGNOSIS — Z7901 Long term (current) use of anticoagulants: Secondary | ICD-10-CM | POA: Diagnosis not present

## 2022-09-14 DIAGNOSIS — I2510 Atherosclerotic heart disease of native coronary artery without angina pectoris: Secondary | ICD-10-CM | POA: Diagnosis not present

## 2022-09-14 DIAGNOSIS — Z86718 Personal history of other venous thrombosis and embolism: Secondary | ICD-10-CM | POA: Insufficient documentation

## 2022-09-14 DIAGNOSIS — N186 End stage renal disease: Secondary | ICD-10-CM | POA: Insufficient documentation

## 2022-09-14 DIAGNOSIS — Z01818 Encounter for other preprocedural examination: Secondary | ICD-10-CM

## 2022-09-14 HISTORY — DX: Unspecified osteoarthritis, unspecified site: M19.90

## 2022-09-14 LAB — CBC
HCT: 35.9 % — ABNORMAL LOW (ref 39.0–52.0)
Hemoglobin: 11.5 g/dL — ABNORMAL LOW (ref 13.0–17.0)
MCH: 30.3 pg (ref 26.0–34.0)
MCHC: 32 g/dL (ref 30.0–36.0)
MCV: 94.5 fL (ref 80.0–100.0)
Platelets: 267 10*3/uL (ref 150–400)
RBC: 3.8 MIL/uL — ABNORMAL LOW (ref 4.22–5.81)
RDW: 15.2 % (ref 11.5–15.5)
WBC: 6.5 10*3/uL (ref 4.0–10.5)
nRBC: 0 % (ref 0.0–0.2)

## 2022-09-14 LAB — COMPREHENSIVE METABOLIC PANEL
ALT: 13 U/L (ref 0–44)
AST: 14 U/L — ABNORMAL LOW (ref 15–41)
Albumin: 3.1 g/dL — ABNORMAL LOW (ref 3.5–5.0)
Alkaline Phosphatase: 147 U/L — ABNORMAL HIGH (ref 38–126)
Anion gap: 17 — ABNORMAL HIGH (ref 5–15)
BUN: 20 mg/dL (ref 6–20)
CO2: 27 mmol/L (ref 22–32)
Calcium: 9.4 mg/dL (ref 8.9–10.3)
Chloride: 93 mmol/L — ABNORMAL LOW (ref 98–111)
Creatinine, Ser: 7.55 mg/dL — ABNORMAL HIGH (ref 0.61–1.24)
GFR, Estimated: 8 mL/min — ABNORMAL LOW (ref >60)
Glucose, Bld: 97 mg/dL (ref 70–99)
Potassium: 3.5 mmol/L (ref 3.5–5.1)
Sodium: 137 mmol/L (ref 135–145)
Total Bilirubin: 0.5 mg/dL (ref 0.3–1.2)
Total Protein: 8.2 g/dL — ABNORMAL HIGH (ref 6.5–8.1)

## 2022-09-14 LAB — SURGICAL PCR SCREEN
MRSA, PCR: NEGATIVE
Staphylococcus aureus: NEGATIVE

## 2022-09-14 LAB — GLUCOSE, CAPILLARY: Glucose-Capillary: 98 mg/dL (ref 70–99)

## 2022-09-14 NOTE — Progress Notes (Signed)
PCP - [name] FNP Cardiologist - Orpah Cobb MD  PPM/ICD - denies Device Orders -  Rep Notified -   Chest x-ray -  EKG - 05/25/22 Stress Test - 01/16/15 ECHO - 02/24/22 Cardiac Cath - 03/16/16  Sleep Study - denies CPAP - no  Fasting Blood Sugar - 100 Checks Blood Sugar 2xweek  Last dose of GLP1 agonist-  na GLP1 instructions:   Blood Thinner Instructions:LD of Coumadin 4/22 per Dr. Roseanne Kaufman instructions Aspirin Instructions:na  ERAS Protcol -clears until 0930 PRE-SURGERY Ensure or G2- G2  COVID TEST- na   Anesthesia review: yes-cardiac history-MI,CHF  Patient denies shortness of breath, fever, cough and chest pain at PAT appointment   All instructions explained to the patient, with a verbal understanding of the material. Patient agrees to go over the instructions while at home for a better understanding. Patient also instructed to wear a mask when out in public prior to surgery. The opportunity to ask questions was provided.

## 2022-09-15 DIAGNOSIS — N2581 Secondary hyperparathyroidism of renal origin: Secondary | ICD-10-CM | POA: Diagnosis not present

## 2022-09-15 DIAGNOSIS — Z992 Dependence on renal dialysis: Secondary | ICD-10-CM | POA: Diagnosis not present

## 2022-09-15 DIAGNOSIS — E213 Hyperparathyroidism, unspecified: Secondary | ICD-10-CM | POA: Diagnosis not present

## 2022-09-15 DIAGNOSIS — N186 End stage renal disease: Secondary | ICD-10-CM | POA: Diagnosis not present

## 2022-09-17 DIAGNOSIS — Z992 Dependence on renal dialysis: Secondary | ICD-10-CM | POA: Diagnosis not present

## 2022-09-17 DIAGNOSIS — N186 End stage renal disease: Secondary | ICD-10-CM | POA: Diagnosis not present

## 2022-09-17 NOTE — Anesthesia Preprocedure Evaluation (Addendum)
Anesthesia Evaluation  Patient identified by MRN, date of birth, ID band Patient awake    Reviewed: Allergy & Precautions, H&P , NPO status , Patient's Chart, lab work & pertinent test results  Airway Mallampati: II  TM Distance: >3 FB Neck ROM: Full    Dental no notable dental hx.    Pulmonary neg pulmonary ROS   Pulmonary exam normal breath sounds clear to auscultation       Cardiovascular hypertension, + CAD, + Past MI and +CHF  + dysrhythmias Atrial Fibrillation  Rhythm:Regular Rate:Normal  Left Ventricle: Left ventricular ejection fraction, by estimation, is 35  to 40%. The left ventricle has moderately decreased function. The left  ventricle demonstrates global hypokinesis. The left ventricular internal  cavity size was mildly dilated.  There is borderline concentric left ventricular hypertrophy. Left  ventricular diastolic parameters are consistent with Grade I diastolic  dysfunction (impaired relaxation).   Right Ventricle: The right ventricular size is mildly enlarged. No  increase in right ventricular wall thickness. Right ventricular systolic  function is low normal.   Left Atrium: Left atrial size was moderately dilated.   Right Atrium: Right atrial size was mildly dilated.   Pericardium: There is no evidence of pericardial effusion.   Mitral Valve: The mitral valve is normal in structure. There is mild  thickening of the mitral valve leaflet(s). There is moderate calcification  of the posterior mitral valve leaflet(s). Normal mobility of the mitral  valve leaflets. Moderate mitral  annular calcification. Mild to moderate mitral valve regurgitation.   Tricuspid Valve: The tricuspid valve is normal in structure. Tricuspid  valve regurgitation is mild.   Aortic Valve: The aortic valve is tricuspid. There is moderate  calcification of the aortic valve. There is mild thickening of the aortic  valve. There is  mild aortic valve annular calcification. Aortic valve  regurgitation is trivial. Aortic valve  sclerosis/calcification is present, without any evidence of aortic  stenosis. Aortic valve mean gradient measures 7.0 mmHg. Aortic valve peak  gradient measures 13.4 mmHg. Aortic valve area, by VTI measures 2.38 cm.      Neuro/Psych CVA  negative psych ROS   GI/Hepatic Neg liver ROS,GERD  ,,  Endo/Other  diabetes    Renal/GU DialysisRenal disease  negative genitourinary   Musculoskeletal negative musculoskeletal ROS (+)    Abdominal   Peds negative pediatric ROS (+)  Hematology negative hematology ROS (+)   Anesthesia Other Findings   Reproductive/Obstetrics negative OB ROS                             Anesthesia Physical Anesthesia Plan  ASA: 4  Anesthesia Plan: General   Post-op Pain Management: Regional block*   Induction: Intravenous  PONV Risk Score and Plan: 2 and Ondansetron, Dexamethasone and Treatment may vary due to age or medical condition  Airway Management Planned: Oral ETT  Additional Equipment:   Intra-op Plan:   Post-operative Plan: Extubation in OR  Informed Consent: I have reviewed the patients History and Physical, chart, labs and discussed the procedure including the risks, benefits and alternatives for the proposed anesthesia with the patient or authorized representative who has indicated his/her understanding and acceptance.     Dental advisory given  Plan Discussed with: CRNA and Surgeon  Anesthesia Plan Comments: (PAT note by Antionette Poles, PA-C: Follows with cardiologist Dr. Algie Coffer for history of nonobstructive CAD by cath 2017, HFrEF (EF 35 to 40% by echo 02/2022), mild  to moderate mitral regurgitation, CVA 2012, DVT 2019, A-flutter on Coumadin, hypertension on midodrine.  On clearance dated 09/06/2022, Dr. Algie Coffer cleared the patient moderate risk stating he was optimized from a cardiac standpoint.    Patient  reports last dose Coumadin 09/13/22 per Dr. Roseanne Kaufman instructions.  ESRD on HD Monday Wednesday Friday.  History of partial blindness.  CMP and CBC from 09/11/2022 reviewed, creatinine elevated consistent with history of ESRD, mild anemia with hemoglobin 11.5, otherwise unremarkable.  Pt will need DOS labs due to ESRD.  EKG 05/24/2022: Sinus rhythm.  Rate 82.  LVH with IVCD and secondary repolarization abnormalities.  Borderline prolonged QT interval.  TTE 02/24/2022: 1. Left ventricular ejection fraction, by estimation, is 35 to 40%. The  left ventricle has moderately decreased function. The left ventricle  demonstrates global hypokinesis. The left ventricular internal cavity size  was mildly dilated. Left ventricular  diastolic parameters are consistent with Grade I diastolic dysfunction  (impaired relaxation).  2. Right ventricular systolic function is low normal. The right  ventricular size is mildly enlarged.  3. Left atrial size was moderately dilated.  4. Right atrial size was mildly dilated.  5. The mitral valve is normal in structure. Mild to moderate mitral valve  regurgitation. Moderate mitral annular calcification.  6. The aortic valve is tricuspid. There is moderate calcification of the  aortic valve. There is mild thickening of the aortic valve. Aortic valve  regurgitation is trivial. Aortic valve sclerosis/calcification is present,  without any evidence of aortic  stenosis.  7. There is mild (Grade II) atheroma plaque involving the aortic root and  ascending aorta.  8. The inferior vena cava is normal in size with greater than 50%  respiratory variability, suggesting right atrial pressure of 3 mmHg.   Cath 03/16/2016: ? Prox LAD lesion, 25 %stenosed. ? There is moderate left ventricular systolic dysfunction. ? LV end diastolic pressure is normal. ? The left ventricular ejection fraction is 25-35% by visual estimate. ? There is trivial (1+) mitral  regurgitation.  )        Anesthesia Quick Evaluation

## 2022-09-17 NOTE — Progress Notes (Signed)
Anesthesia Chart Review:  Follows with cardiologist Dr. Algie Coffer for history of nonobstructive CAD by cath 2017, HFrEF (EF 35 to 40% by echo 02/2022), mild to moderate mitral regurgitation, CVA 2012, DVT 2019, A-flutter on Coumadin, hypertension on midodrine.  On clearance dated 09/06/2022, Dr. Algie Coffer cleared the patient moderate risk stating he was optimized from a cardiac standpoint.    Patient reports last dose Coumadin 09/13/22 per Dr. Roseanne Kaufman instructions.  ESRD on HD Monday Wednesday Friday.  History of partial blindness.  CMP and CBC from 09/11/2022 reviewed, creatinine elevated consistent with history of ESRD, mild anemia with hemoglobin 11.5, otherwise unremarkable.  Pt will need DOS labs due to ESRD.  EKG 05/24/2022: Sinus rhythm.  Rate 82.  LVH with IVCD and secondary repolarization abnormalities.  Borderline prolonged QT interval.  TTE 02/24/2022: 1. Left ventricular ejection fraction, by estimation, is 35 to 40%. The  left ventricle has moderately decreased function. The left ventricle  demonstrates global hypokinesis. The left ventricular internal cavity size  was mildly dilated. Left ventricular  diastolic parameters are consistent with Grade I diastolic dysfunction  (impaired relaxation).   2. Right ventricular systolic function is low normal. The right  ventricular size is mildly enlarged.   3. Left atrial size was moderately dilated.   4. Right atrial size was mildly dilated.   5. The mitral valve is normal in structure. Mild to moderate mitral valve  regurgitation. Moderate mitral annular calcification.   6. The aortic valve is tricuspid. There is moderate calcification of the  aortic valve. There is mild thickening of the aortic valve. Aortic valve  regurgitation is trivial. Aortic valve sclerosis/calcification is present,  without any evidence of aortic  stenosis.   7. There is mild (Grade II) atheroma plaque involving the aortic root and  ascending aorta.   8.  The inferior vena cava is normal in size with greater than 50%  respiratory variability, suggesting right atrial pressure of 3 mmHg.   Cath 03/16/2016: Prox LAD lesion, 25 %stenosed. There is moderate left ventricular systolic dysfunction. LV end diastolic pressure is normal. The left ventricular ejection fraction is 25-35% by visual estimate. There is trivial (1+) mitral regurgitation.    Zannie Cove [facility]/Anesthesiology Phone [phone] 09/17/2022 11:52 AM

## 2022-09-20 ENCOUNTER — Other Ambulatory Visit: Payer: Self-pay

## 2022-09-20 ENCOUNTER — Inpatient Hospital Stay (HOSPITAL_COMMUNITY): Payer: Medicare HMO

## 2022-09-20 ENCOUNTER — Ambulatory Visit (HOSPITAL_COMMUNITY): Payer: Medicare HMO | Admitting: Certified Registered Nurse Anesthetist

## 2022-09-20 ENCOUNTER — Encounter (HOSPITAL_COMMUNITY): Payer: Self-pay | Admitting: Orthopedic Surgery

## 2022-09-20 ENCOUNTER — Encounter (HOSPITAL_COMMUNITY)
Admission: AD | Disposition: A | Discharge status: Home / Self Care | Payer: Self-pay | Source: Ambulatory Visit | Attending: Orthopedic Surgery

## 2022-09-20 ENCOUNTER — Ambulatory Visit (HOSPITAL_COMMUNITY): Payer: Medicare HMO | Admitting: Physician Assistant

## 2022-09-20 ENCOUNTER — Observation Stay (HOSPITAL_COMMUNITY)
Admission: AD | Admit: 2022-09-20 | Discharge: 2022-09-21 | Disposition: A | Discharge status: Home / Self Care | Payer: Medicare HMO | Source: Ambulatory Visit | Attending: Orthopedic Surgery | Admitting: Orthopedic Surgery

## 2022-09-20 DIAGNOSIS — I2510 Atherosclerotic heart disease of native coronary artery without angina pectoris: Secondary | ICD-10-CM | POA: Insufficient documentation

## 2022-09-20 DIAGNOSIS — Z96611 Presence of right artificial shoulder joint: Principal | ICD-10-CM | POA: Diagnosis present

## 2022-09-20 DIAGNOSIS — M12811 Other specific arthropathies, not elsewhere classified, right shoulder: Secondary | ICD-10-CM | POA: Diagnosis not present

## 2022-09-20 DIAGNOSIS — Z79899 Other long term (current) drug therapy: Secondary | ICD-10-CM | POA: Insufficient documentation

## 2022-09-20 DIAGNOSIS — I252 Old myocardial infarction: Secondary | ICD-10-CM

## 2022-09-20 DIAGNOSIS — N250 Renal osteodystrophy: Secondary | ICD-10-CM | POA: Diagnosis not present

## 2022-09-20 DIAGNOSIS — Z01818 Encounter for other preprocedural examination: Secondary | ICD-10-CM

## 2022-09-20 DIAGNOSIS — I4891 Unspecified atrial fibrillation: Secondary | ICD-10-CM | POA: Diagnosis not present

## 2022-09-20 DIAGNOSIS — M19011 Primary osteoarthritis, right shoulder: Secondary | ICD-10-CM | POA: Diagnosis not present

## 2022-09-20 DIAGNOSIS — Z86718 Personal history of other venous thrombosis and embolism: Secondary | ICD-10-CM | POA: Insufficient documentation

## 2022-09-20 DIAGNOSIS — E1122 Type 2 diabetes mellitus with diabetic chronic kidney disease: Secondary | ICD-10-CM | POA: Insufficient documentation

## 2022-09-20 DIAGNOSIS — I132 Hypertensive heart and chronic kidney disease with heart failure and with stage 5 chronic kidney disease, or end stage renal disease: Secondary | ICD-10-CM | POA: Insufficient documentation

## 2022-09-20 DIAGNOSIS — I110 Hypertensive heart disease with heart failure: Secondary | ICD-10-CM

## 2022-09-20 DIAGNOSIS — Z7901 Long term (current) use of anticoagulants: Secondary | ICD-10-CM | POA: Insufficient documentation

## 2022-09-20 DIAGNOSIS — I509 Heart failure, unspecified: Secondary | ICD-10-CM | POA: Diagnosis not present

## 2022-09-20 DIAGNOSIS — Z8673 Personal history of transient ischemic attack (TIA), and cerebral infarction without residual deficits: Secondary | ICD-10-CM | POA: Insufficient documentation

## 2022-09-20 DIAGNOSIS — Z471 Aftercare following joint replacement surgery: Secondary | ICD-10-CM | POA: Diagnosis not present

## 2022-09-20 DIAGNOSIS — G8918 Other acute postprocedural pain: Secondary | ICD-10-CM | POA: Diagnosis not present

## 2022-09-20 DIAGNOSIS — Z992 Dependence on renal dialysis: Secondary | ICD-10-CM | POA: Diagnosis not present

## 2022-09-20 DIAGNOSIS — M75101 Unspecified rotator cuff tear or rupture of right shoulder, not specified as traumatic: Secondary | ICD-10-CM | POA: Diagnosis not present

## 2022-09-20 DIAGNOSIS — N186 End stage renal disease: Secondary | ICD-10-CM | POA: Diagnosis not present

## 2022-09-20 DIAGNOSIS — D631 Anemia in chronic kidney disease: Secondary | ICD-10-CM | POA: Diagnosis not present

## 2022-09-20 HISTORY — PX: REVERSE SHOULDER ARTHROPLASTY: SHX5054

## 2022-09-20 LAB — PROTIME-INR
INR: 1.5 — ABNORMAL HIGH (ref 0.8–1.2)
Prothrombin Time: 17.9 seconds — ABNORMAL HIGH (ref 11.4–15.2)

## 2022-09-20 LAB — GLUCOSE, CAPILLARY
Glucose-Capillary: 83 mg/dL (ref 70–99)
Glucose-Capillary: 87 mg/dL (ref 70–99)
Glucose-Capillary: 89 mg/dL (ref 70–99)
Glucose-Capillary: 100 mg/dL — ABNORMAL HIGH (ref 70–99)

## 2022-09-20 LAB — POCT I-STAT, CHEM 8
BUN: 43 mg/dL — ABNORMAL HIGH (ref 6–20)
Calcium, Ion: 1.11 mmol/L — ABNORMAL LOW (ref 1.15–1.40)
Chloride: 98 mmol/L (ref 98–111)
Creatinine, Ser: 12.1 mg/dL — ABNORMAL HIGH (ref 0.61–1.24)
Glucose, Bld: 85 mg/dL (ref 70–99)
HCT: 34 % — ABNORMAL LOW (ref 39.0–52.0)
Hemoglobin: 11.6 g/dL — ABNORMAL LOW (ref 13.0–17.0)
Potassium: 4.2 mmol/L (ref 3.5–5.1)
Sodium: 134 mmol/L — ABNORMAL LOW (ref 135–145)
TCO2: 27 mmol/L (ref 22–32)

## 2022-09-20 SURGERY — ARTHROPLASTY, SHOULDER, TOTAL, REVERSE
Anesthesia: General | Site: Shoulder | Laterality: Right

## 2022-09-20 MED ORDER — PHENOL 1.4 % MT LIQD: 1.0000 | OROMUCOSAL | Status: DC | PRN

## 2022-09-20 MED ORDER — ISOSORBIDE DINITRATE 10 MG PO TABS
10.0000 mg | ORAL_TABLET | Freq: Two times a day (BID) | ORAL | Status: DC
  Administered 2022-09-20 – 2022-09-21 (×2): 10 mg via ORAL
  Filled 2022-09-20 (×3): qty 1

## 2022-09-20 MED ORDER — ALBUTEROL SULFATE (2.5 MG/3ML) 0.083% IN NEBU: 3.0000 mL | INHALATION_SOLUTION | Freq: Four times a day (QID) | RESPIRATORY_TRACT | Status: DC | PRN

## 2022-09-20 MED ORDER — FENTANYL CITRATE (PF) 250 MCG/5ML IJ SOLN
INTRAMUSCULAR | Status: AC
  Filled 2022-09-20: qty 5

## 2022-09-20 MED ORDER — CHLORHEXIDINE GLUCONATE 0.12 % MT SOLN
15.0000 mL | Freq: Once | OROMUCOSAL | Status: AC
  Administered 2022-09-20: 15 mL via OROMUCOSAL
  Filled 2022-09-20: qty 15

## 2022-09-20 MED ORDER — ONDANSETRON HCL 4 MG/2ML IJ SOLN
INTRAMUSCULAR | Status: AC
  Filled 2022-09-20: qty 2

## 2022-09-20 MED ORDER — SUGAMMADEX SODIUM 200 MG/2ML IV SOLN
INTRAVENOUS | Status: DC | PRN
  Administered 2022-09-20: 250 mg via INTRAVENOUS

## 2022-09-20 MED ORDER — SODIUM CHLORIDE 0.9 % IV SOLN: INTRAVENOUS | Status: DC

## 2022-09-20 MED ORDER — VASOPRESSIN 20 UNIT/ML IV SOLN
INTRAVENOUS | Status: AC
  Filled 2022-09-20: qty 1

## 2022-09-20 MED ORDER — PANTOPRAZOLE SODIUM 40 MG PO TBEC
40.0000 mg | DELAYED_RELEASE_TABLET | Freq: Two times a day (BID) | ORAL | Status: DC
  Administered 2022-09-20 – 2022-09-21 (×2): 40 mg via ORAL
  Filled 2022-09-20 (×2): qty 1

## 2022-09-20 MED ORDER — OXYCODONE HCL 5 MG PO TABS: 5.0000 mg | ORAL_TABLET | Freq: Once | ORAL | Status: DC | PRN

## 2022-09-20 MED ORDER — PHENYLEPHRINE HCL-NACL 20-0.9 MG/250ML-% IV SOLN
INTRAVENOUS | Status: DC | PRN
  Administered 2022-09-20: 25 ug/min via INTRAVENOUS

## 2022-09-20 MED ORDER — ACETAMINOPHEN 325 MG PO TABS: 325.0000 mg | ORAL_TABLET | Freq: Four times a day (QID) | ORAL | Status: DC | PRN

## 2022-09-20 MED ORDER — ACETAMINOPHEN 500 MG PO TABS
1000.0000 mg | ORAL_TABLET | Freq: Four times a day (QID) | ORAL | Status: DC
  Administered 2022-09-20 – 2022-09-21 (×3): 1000 mg via ORAL
  Filled 2022-09-20 (×3): qty 2

## 2022-09-20 MED ORDER — WARFARIN - PHYSICIAN DOSING INPATIENT: Freq: Every day | Status: DC

## 2022-09-20 MED ORDER — FENTANYL CITRATE (PF) 100 MCG/2ML IJ SOLN: 25.0000 ug | INTRAMUSCULAR | Status: DC | PRN

## 2022-09-20 MED ORDER — OXYCODONE HCL 5 MG PO TABS
10.0000 mg | ORAL_TABLET | ORAL | Status: DC | PRN
  Administered 2022-09-20 – 2022-09-21 (×3): 15 mg via ORAL
  Filled 2022-09-20 (×3): qty 3

## 2022-09-20 MED ORDER — EPHEDRINE SULFATE-NACL 50-0.9 MG/10ML-% IV SOSY
PREFILLED_SYRINGE | INTRAVENOUS | Status: DC | PRN
  Administered 2022-09-20 (×2): 10 mg via INTRAVENOUS

## 2022-09-20 MED ORDER — ONDANSETRON HCL 4 MG/2ML IJ SOLN: 4.0000 mg | Freq: Four times a day (QID) | INTRAMUSCULAR | Status: DC | PRN

## 2022-09-20 MED ORDER — METOPROLOL SUCCINATE ER 25 MG PO TB24
ORAL_TABLET | ORAL | Status: AC
  Filled 2022-09-20: qty 1

## 2022-09-20 MED ORDER — VASOPRESSIN 20 UNIT/ML IV SOLN
INTRAVENOUS | Status: DC | PRN
  Administered 2022-09-20 (×2): 1 [IU] via INTRAVENOUS

## 2022-09-20 MED ORDER — DIGOXIN 125 MCG PO TABS: 0.1250 mg | ORAL_TABLET | ORAL | Status: DC

## 2022-09-20 MED ORDER — METOCLOPRAMIDE HCL 5 MG PO TABS: 5.0000 mg | ORAL_TABLET | Freq: Three times a day (TID) | ORAL | Status: DC | PRN

## 2022-09-20 MED ORDER — METOPROLOL SUCCINATE ER 25 MG PO TB24
25.0000 mg | ORAL_TABLET | Freq: Every day | ORAL | Status: DC
  Administered 2022-09-21: 25 mg via ORAL
  Filled 2022-09-20: qty 1

## 2022-09-20 MED ORDER — PROPOFOL 10 MG/ML IV BOLUS
INTRAVENOUS | Status: DC | PRN
  Administered 2022-09-20: 100 mg via INTRAVENOUS

## 2022-09-20 MED ORDER — DOXERCALCIFEROL 4 MCG/2ML IV SOLN: 1.0000 ug | INTRAVENOUS | Status: DC

## 2022-09-20 MED ORDER — NITROGLYCERIN 0.4 MG SL SUBL: 0.4000 mg | SUBLINGUAL_TABLET | SUBLINGUAL | Status: DC | PRN

## 2022-09-20 MED ORDER — TRANEXAMIC ACID-NACL 1000-0.7 MG/100ML-% IV SOLN
1000.0000 mg | Freq: Once | INTRAVENOUS | Status: AC
  Administered 2022-09-20: 1000 mg via INTRAVENOUS
  Filled 2022-09-20: qty 100

## 2022-09-20 MED ORDER — ALBUMIN HUMAN 5 % IV SOLN: INTRAVENOUS | Status: DC | PRN

## 2022-09-20 MED ORDER — HYDROMORPHONE HCL 1 MG/ML IJ SOLN
0.5000 mg | INTRAMUSCULAR | Status: DC | PRN
  Administered 2022-09-20: 1 mg via INTRAVENOUS
  Filled 2022-09-20: qty 1

## 2022-09-20 MED ORDER — ROCURONIUM BROMIDE 10 MG/ML (PF) SYRINGE
PREFILLED_SYRINGE | INTRAVENOUS | Status: DC | PRN
  Administered 2022-09-20: 60 mg via INTRAVENOUS

## 2022-09-20 MED ORDER — METOPROLOL SUCCINATE ER 25 MG PO TB24
25.0000 mg | ORAL_TABLET | Freq: Once | ORAL | Status: AC
  Administered 2022-09-20: 25 mg via ORAL

## 2022-09-20 MED ORDER — METOCLOPRAMIDE HCL 5 MG/ML IJ SOLN: 5.0000 mg | Freq: Three times a day (TID) | INTRAMUSCULAR | Status: DC | PRN

## 2022-09-20 MED ORDER — IPRATROPIUM BROMIDE 0.06 % NA SOLN: 2.0000 | Freq: Four times a day (QID) | NASAL | Status: DC

## 2022-09-20 MED ORDER — DEXAMETHASONE SODIUM PHOSPHATE 10 MG/ML IJ SOLN
INTRAMUSCULAR | Status: DC | PRN
  Administered 2022-09-20: 5 mg via INTRAVENOUS

## 2022-09-20 MED ORDER — CEFAZOLIN SODIUM-DEXTROSE 1-4 GM/50ML-% IV SOLN
1.0000 g | INTRAVENOUS | Status: AC
  Administered 2022-09-21: 1 g via INTRAVENOUS
  Filled 2022-09-20: qty 50

## 2022-09-20 MED ORDER — LIDOCAINE 2% (20 MG/ML) 5 ML SYRINGE
INTRAMUSCULAR | Status: AC
  Filled 2022-09-20: qty 5

## 2022-09-20 MED ORDER — ONDANSETRON HCL 4 MG/2ML IJ SOLN
INTRAMUSCULAR | Status: DC | PRN
  Administered 2022-09-20: 4 mg via INTRAVENOUS

## 2022-09-20 MED ORDER — LIDOCAINE 2% (20 MG/ML) 5 ML SYRINGE
INTRAMUSCULAR | Status: DC | PRN
  Administered 2022-09-20: 100 mg via INTRAVENOUS

## 2022-09-20 MED ORDER — MECLIZINE HCL 25 MG PO TABS: 25.0000 mg | ORAL_TABLET | Freq: Three times a day (TID) | ORAL | Status: DC | PRN

## 2022-09-20 MED ORDER — MIDODRINE HCL 5 MG PO TABS
10.0000 mg | ORAL_TABLET | Freq: Three times a day (TID) | ORAL | Status: DC
  Administered 2022-09-21: 10 mg via ORAL
  Filled 2022-09-20: qty 2

## 2022-09-20 MED ORDER — 0.9 % SODIUM CHLORIDE (POUR BTL) OPTIME
TOPICAL | Status: DC | PRN
  Administered 2022-09-20: 1000 mL

## 2022-09-20 MED ORDER — LACTATED RINGERS IV SOLN: INTRAVENOUS | Status: DC

## 2022-09-20 MED ORDER — INSULIN ASPART 100 UNIT/ML IJ SOLN: 0.0000 [IU] | INTRAMUSCULAR | Status: DC | PRN

## 2022-09-20 MED ORDER — CEFAZOLIN SODIUM-DEXTROSE 2-4 GM/100ML-% IV SOLN
2.0000 g | INTRAVENOUS | Status: AC
  Administered 2022-09-20: 2 g via INTRAVENOUS
  Filled 2022-09-20: qty 100

## 2022-09-20 MED ORDER — OXYCODONE HCL 5 MG PO TABS: 5.0000 mg | ORAL_TABLET | ORAL | Status: DC | PRN

## 2022-09-20 MED ORDER — PROPOFOL 10 MG/ML IV BOLUS
INTRAVENOUS | Status: AC
  Filled 2022-09-20: qty 20

## 2022-09-20 MED ORDER — GABAPENTIN 300 MG PO CAPS
300.0000 mg | ORAL_CAPSULE | Freq: Every day | ORAL | Status: DC
  Administered 2022-09-20: 300 mg via ORAL
  Filled 2022-09-20: qty 1

## 2022-09-20 MED ORDER — MIDAZOLAM HCL 2 MG/2ML IJ SOLN
INTRAMUSCULAR | Status: AC
  Administered 2022-09-20: 1 mg via INTRAVENOUS
  Filled 2022-09-20: qty 2

## 2022-09-20 MED ORDER — RIFAXIMIN 550 MG PO TABS
550.0000 mg | ORAL_TABLET | Freq: Three times a day (TID) | ORAL | Status: DC
  Administered 2022-09-20 – 2022-09-21 (×2): 550 mg via ORAL
  Filled 2022-09-20 (×5): qty 1

## 2022-09-20 MED ORDER — MENTHOL 3 MG MT LOZG: 1.0000 | LOZENGE | OROMUCOSAL | Status: DC | PRN

## 2022-09-20 MED ORDER — VANCOMYCIN HCL 1 G IV SOLR
INTRAVENOUS | Status: DC | PRN
  Administered 2022-09-20: 1000 mg via TOPICAL

## 2022-09-20 MED ORDER — CHLORHEXIDINE GLUCONATE CLOTH 2 % EX PADS
6.0000 | MEDICATED_PAD | Freq: Every day | CUTANEOUS | Status: DC
  Administered 2022-09-21: 6 via TOPICAL

## 2022-09-20 MED ORDER — ORAL CARE MOUTH RINSE: 15.0000 mL | Freq: Once | OROMUCOSAL | Status: AC

## 2022-09-20 MED ORDER — FENTANYL CITRATE (PF) 250 MCG/5ML IJ SOLN
INTRAMUSCULAR | Status: DC | PRN
  Administered 2022-09-20: 50 ug via INTRAVENOUS

## 2022-09-20 MED ORDER — MIDAZOLAM HCL 2 MG/2ML IJ SOLN: 1.0000 mg | Freq: Once | INTRAMUSCULAR | Status: AC

## 2022-09-20 MED ORDER — FENTANYL CITRATE (PF) 100 MCG/2ML IJ SOLN: 50.0000 ug | Freq: Once | INTRAMUSCULAR | Status: DC

## 2022-09-20 MED ORDER — PHENYLEPHRINE 80 MCG/ML (10ML) SYRINGE FOR IV PUSH (FOR BLOOD PRESSURE SUPPORT)
PREFILLED_SYRINGE | INTRAVENOUS | Status: DC | PRN
  Administered 2022-09-20: 80 ug via INTRAVENOUS
  Administered 2022-09-20 (×2): 160 ug via INTRAVENOUS
  Administered 2022-09-20: 80 ug via INTRAVENOUS
  Administered 2022-09-20: 160 ug via INTRAVENOUS
  Administered 2022-09-20: 80 ug via INTRAVENOUS

## 2022-09-20 MED ORDER — TRANEXAMIC ACID-NACL 1000-0.7 MG/100ML-% IV SOLN
1000.0000 mg | INTRAVENOUS | Status: AC
  Administered 2022-09-20: 1000 mg via INTRAVENOUS
  Filled 2022-09-20: qty 100

## 2022-09-20 MED ORDER — ONDANSETRON HCL 4 MG/2ML IJ SOLN: 4.0000 mg | Freq: Once | INTRAMUSCULAR | Status: DC | PRN

## 2022-09-20 MED ORDER — MIDODRINE HCL 5 MG PO TABS: 10.0000 mg | ORAL_TABLET | Freq: Three times a day (TID) | ORAL | Status: DC

## 2022-09-20 MED ORDER — OXYCODONE HCL 5 MG/5ML PO SOLN: 5.0000 mg | Freq: Once | ORAL | Status: DC | PRN

## 2022-09-20 MED ORDER — FENTANYL CITRATE (PF) 100 MCG/2ML IJ SOLN
INTRAMUSCULAR | Status: AC
  Administered 2022-09-20: 50 ug
  Filled 2022-09-20: qty 2

## 2022-09-20 MED ORDER — DOCUSATE SODIUM 100 MG PO CAPS
100.0000 mg | ORAL_CAPSULE | Freq: Two times a day (BID) | ORAL | Status: DC
  Administered 2022-09-20 – 2022-09-21 (×2): 100 mg via ORAL
  Filled 2022-09-20 (×2): qty 1

## 2022-09-20 MED ORDER — METOPROLOL TARTRATE 25 MG PO TABS: 25.0000 mg | ORAL_TABLET | Freq: Two times a day (BID) | ORAL | Status: DC

## 2022-09-20 MED ORDER — VANCOMYCIN HCL 1000 MG IV SOLR
INTRAVENOUS | Status: AC
  Filled 2022-09-20: qty 20

## 2022-09-20 MED ORDER — SUCROFERRIC OXYHYDROXIDE 500 MG PO CHEW
1000.0000 mg | CHEWABLE_TABLET | Freq: Three times a day (TID) | ORAL | Status: DC
  Administered 2022-09-21: 1000 mg via ORAL
  Filled 2022-09-20: qty 2

## 2022-09-20 MED ORDER — DEXAMETHASONE SODIUM PHOSPHATE 10 MG/ML IJ SOLN
INTRAMUSCULAR | Status: AC
  Filled 2022-09-20: qty 1

## 2022-09-20 MED ORDER — BUPIVACAINE LIPOSOME 1.3 % IJ SUSP
INTRAMUSCULAR | Status: DC | PRN
  Administered 2022-09-20: 10 mL via PERINEURAL

## 2022-09-20 MED ORDER — ROCURONIUM BROMIDE 10 MG/ML (PF) SYRINGE
PREFILLED_SYRINGE | INTRAVENOUS | Status: AC
  Filled 2022-09-20: qty 10

## 2022-09-20 MED ORDER — ONDANSETRON HCL 4 MG PO TABS: 4.0000 mg | ORAL_TABLET | Freq: Four times a day (QID) | ORAL | Status: DC | PRN

## 2022-09-20 MED ORDER — BUPIVACAINE HCL (PF) 0.5 % IJ SOLN
INTRAMUSCULAR | Status: DC | PRN
  Administered 2022-09-20: 15 mL via PERINEURAL

## 2022-09-20 MED ORDER — CINACALCET HCL 30 MG PO TABS
180.0000 mg | ORAL_TABLET | Freq: Every day | ORAL | Status: DC
  Administered 2022-09-21: 180 mg via ORAL
  Filled 2022-09-20: qty 6

## 2022-09-20 MED ORDER — WARFARIN SODIUM 10 MG PO TABS
11.0000 mg | ORAL_TABLET | Freq: Every day | ORAL | Status: DC
  Administered 2022-09-20: 11 mg via ORAL
  Filled 2022-09-20 (×2): qty 1

## 2022-09-20 SURGICAL SUPPLY — 76 items
AID PSTN UNV HD RSTRNT DISP (MISCELLANEOUS) ×1
BIT DRILL 2.7 W/STOP DISP (BIT) IMPLANT
BIT DRILL 5/64X5 DISP (BIT) ×1 IMPLANT
BIT DRILL TWIST 2.7 (BIT) IMPLANT
BLADE SAG 18X100X1.27 (BLADE) ×1 IMPLANT
BRNG HUM +3 36 RVRS SHLDR (Shoulder) ×1 IMPLANT
BSPLAT GLND LRG AUG TPR ADPR (Joint) ×1 IMPLANT
COMP REV AUG LG W/TAPER/GLENOI (Joint) ×1 IMPLANT
COMPONENT RV AUG LG W/TAPR/GLN (Joint) IMPLANT
COVER SURGICAL LIGHT HANDLE (MISCELLANEOUS) ×1 IMPLANT
DRAPE IMP U-DRAPE 54X76 (DRAPES) ×2 IMPLANT
DRAPE INCISE IOBAN 66X45 STRL (DRAPES) ×1 IMPLANT
DRAPE ORTHO SPLIT 77X108 STRL (DRAPES) ×2
DRAPE SURG 17X23 STRL (DRAPES) ×1 IMPLANT
DRAPE SURG ORHT 6 SPLT 77X108 (DRAPES) ×2 IMPLANT
DRAPE U-SHAPE 47X51 STRL (DRAPES) ×1 IMPLANT
DRESSING AQUACEL AG SP 3.5X6 (GAUZE/BANDAGES/DRESSINGS) ×1 IMPLANT
DRSG AQUACEL AG ADV 3.5X10 (GAUZE/BANDAGES/DRESSINGS) ×1 IMPLANT
DRSG AQUACEL AG SP 3.5X6 (GAUZE/BANDAGES/DRESSINGS)
DURAPREP 26ML APPLICATOR (WOUND CARE) ×1 IMPLANT
ELECT BLADE 4.0 EZ CLEAN MEGAD (MISCELLANEOUS) ×1
ELECT REM PT RETURN 9FT ADLT (ELECTROSURGICAL) ×1
ELECTRODE BLDE 4.0 EZ CLN MEGD (MISCELLANEOUS) ×1 IMPLANT
ELECTRODE REM PT RTRN 9FT ADLT (ELECTROSURGICAL) ×1 IMPLANT
FACESHIELD WRAPAROUND (MASK) ×1 IMPLANT
FACESHIELD WRAPAROUND OR TEAM (MASK) ×1 IMPLANT
GLENOID SPHERE 36MM CVD +3 (Orthopedic Implant) IMPLANT
GLOVE BIO SURGEON STRL SZ7.5 (GLOVE) ×2 IMPLANT
GLOVE BIOGEL PI IND STRL 8 (GLOVE) ×2 IMPLANT
GOWN STRL REUS W/ TWL LRG LVL3 (GOWN DISPOSABLE) ×1 IMPLANT
GOWN STRL REUS W/ TWL XL LVL3 (GOWN DISPOSABLE) ×2 IMPLANT
GOWN STRL REUS W/TWL LRG LVL3 (GOWN DISPOSABLE) ×1
GOWN STRL REUS W/TWL XL LVL3 (GOWN DISPOSABLE) ×2
KIT BASIN OR (CUSTOM PROCEDURE TRAY) ×1 IMPLANT
KIT TURNOVER KIT B (KITS) ×1 IMPLANT
MANIFOLD NEPTUNE II (INSTRUMENTS) ×1 IMPLANT
NDL 1/2 CIR MAYO (NEEDLE) ×1 IMPLANT
NDL HYPO 25GX1X1/2 BEV (NEEDLE) ×1 IMPLANT
NEEDLE 1/2 CIR MAYO (NEEDLE) ×1 IMPLANT
NEEDLE HYPO 25GX1X1/2 BEV (NEEDLE) ×1 IMPLANT
NS IRRIG 1000ML POUR BTL (IV SOLUTION) ×1 IMPLANT
PACK SHOULDER (CUSTOM PROCEDURE TRAY) ×1 IMPLANT
PAD ARMBOARD 7.5X6 YLW CONV (MISCELLANEOUS) ×2 IMPLANT
PIN THREADED REVERSE (PIN) IMPLANT
REAMER GUIDE BUSHING SURG DISP (MISCELLANEOUS) IMPLANT
REAMER GUIDE W/SCREW AUG (MISCELLANEOUS) IMPLANT
RESTRAINT HEAD UNIVERSAL NS (MISCELLANEOUS) ×1 IMPLANT
SCREW BONE LOCKING 4.75X35X3.5 (Screw) IMPLANT
SCREW BONE STRL 6.5MMX30MM (Screw) IMPLANT
SCREW LOCKING 4.75MMX15MM (Screw) IMPLANT
SCREW NON-LOCK 4.75X35X3.5 (Screw) IMPLANT
SLING ARM IMMOBILIZER LRG (SOFTGOODS) IMPLANT
SLING ARM IMMOBILIZER MED (SOFTGOODS) IMPLANT
SPONGE T-LAP 18X18 ~~LOC~~+RFID (SPONGE) IMPLANT
SPONGE T-LAP 4X18 ~~LOC~~+RFID (SPONGE) ×1 IMPLANT
STEM HUM IDENT STD SHLD 13 (Stem) IMPLANT
STRIP CLOSURE SKIN 1/2X4 (GAUZE/BANDAGES/DRESSINGS) ×1 IMPLANT
SUCTION FRAZIER HANDLE 10FR (MISCELLANEOUS) ×1
SUCTION TUBE FRAZIER 10FR DISP (MISCELLANEOUS) ×1 IMPLANT
SUT FIBERWIRE #2 38 T-5 BLUE (SUTURE) ×1
SUT MNCRL AB 3-0 PS2 18 (SUTURE) ×1 IMPLANT
SUT VIC AB 0 CT1 27 (SUTURE) ×1
SUT VIC AB 0 CT1 27XBRD ANBCTR (SUTURE) IMPLANT
SUT VIC AB 1 CT1 27 (SUTURE)
SUT VIC AB 1 CT1 27XBRD ANBCTR (SUTURE) IMPLANT
SUT VIC AB 2-0 CT1 27 (SUTURE) ×1
SUT VIC AB 2-0 CT1 TAPERPNT 27 (SUTURE) ×1 IMPLANT
SUTURE FIBERWR #2 38 T-5 BLUE (SUTURE) ×1 IMPLANT
SYR CONTROL 10ML LL (SYRINGE) ×1 IMPLANT
TOWEL GREEN STERILE (TOWEL DISPOSABLE) ×1 IMPLANT
TOWEL GREEN STERILE FF (TOWEL DISPOSABLE) ×1 IMPLANT
TOWER CARTRIDGE SMART MIX (DISPOSABLE) IMPLANT
TRAY HUM REV SHOULDER 36 +3 (Shoulder) IMPLANT
TRAY HUMERAL NEUTRAL EXT 6 (Shoulder) IMPLANT
WATER STERILE IRR 1000ML POUR (IV SOLUTION) ×1 IMPLANT
YANKAUER SUCT BULB TIP NO VENT (SUCTIONS) ×1 IMPLANT

## 2022-09-20 NOTE — Progress Notes (Signed)
PHARMACY NOTE:  ANTIMICROBIAL RENAL DOSAGE ADJUSTMENT  Current antimicrobial regimen includes a mismatch between antimicrobial dosage and estimated renal function.  As per policy approved by the Pharmacy & Therapeutics and Medical Executive Committees, the antimicrobial dosage will be adjusted accordingly.  Current antimicrobial dosage:  Cefazolin 1gm Iv q6h x 3  Indication: surgical prophylaxis  Renal Function:  Estimated Creatinine Clearance: 8.6 mL/min (A) (by C-G formula based on SCr of 12.1 mg/dL (H)). [x]      On intermittent HD, scheduled: []      On CRRT    Antimicrobial dosage has been changed to:  Cefazolin 1gm Iv q24h x 1  Additional comments:   Tawonda Legaspi A. Jeanella Craze, PharmD, BCPS, FNKF Clinical Pharmacist [place] Please utilize Amion for appropriate phone number to reach the unit pharmacist [facility])  09/20/2022 4:26 PM

## 2022-09-20 NOTE — Op Note (Signed)
09/20/2022  2:31 PM  PATIENT:  [name]    PRE-OPERATIVE DIAGNOSIS:  Right shoulder rotator cuff arthropathy  POST-OPERATIVE DIAGNOSIS:  Same  PROCEDURE: 1.  Right REVERSE SHOULDER ARTHROPLASTY 2.  Right shoulder open transfer of long head of biceps tendon to pectoralis major tendon.  SURGEON:  Yolonda Kida, MD  ASSISTANT: Dion Saucier, PA-C  Assistant attestation:  PA McClung present for the entire procedure.  ANESTHESIA:   General with interscalene block with Exparel  ESTIMATED BLOOD LOSS: 200 cc  PREOPERATIVE INDICATIONS:  [name] is a  62 y.o. male with a diagnosis of Right shoulder rotator cuff arthropathy who failed conservative measures and elected for surgical management.    The risks benefits and alternatives were discussed with the patient preoperatively including but not limited to the risks of infection, bleeding, nerve injury, cardiopulmonary complications, the need for revision surgery, dislocation, brachial plexus palsy, incomplete relief of pain, among others, and the patient was willing to proceed.  OPERATIVE IMPLANTS:  Zimmer identity size 13 stem with a -6 mm extended neutral humeral tray and a +3 thickness comprehensive polyethylene liner  On the glenoid side a large augment oriented at the 11:30 position on the 25 mm baseplate with a size 30 central compression screw and 4 peripheral locking screws.  36 mm +3 lateralized glenosphere   OPERATIVE FINDINGS:  Advanced degenerative changes of the glenohumeral joint with cystic changes throughout the glenoid as well as on the humeral head.  Significant synovitis consistent with renal osteodystrophy as well as likely inflammatory arthropathy.  Supraspinatus was in fact intact but very thin and contracted as well as the infraspinatus.  Teres minor was a normal appearance as well as the subscapularis.  Long head biceps tendon was intact with significant tenosynovitis in the groove.  Otherwise no  infectious signs or concerns.  OPERATIVE PROCEDURE: The patient was brought to the operating room and placed in the supine position. General anesthesia was administered. IV antibiotics were given. A Foley was not placed. Time out was performed. The upper extremity was prepped and draped in usual sterile fashion. The patient was in a beachchair position. Deltopectoral approach was carried out. The biceps was tenotomized and transferred to the pectoralis tendon with #2 Fiberwire. The subscapularis was released off of the bone.   I then performed circumferential releases of the humerus, and then dislocated the head, and then reamed with the reamer to the above named size.  I then applied the jig, and cut the humeral head in 30 of retroversion, and then turned my attention to the glenoid.  Deep retractors were placed, and I resected the labrum, and then placed a guidepin into the center position on the glenoid, with slight inferior inclination. I then reamed over the guidepin, and this created a small metaphyseal cancellus blush inferiorly, removing just the cartilage to the subchondral bone superiorly.  We confirmed that there was good fit and fill with a size large augment oriented at the 11:30 position.  The base plate was selected and impacted place, and then I secured it centrally with a nonlocking screw, and I had excellent purchase both inferiorly and superiorly. I placed a short locking screws on anterior and posterior aspects.  I then turned my attention to the glenosphere, and impacted this into place, placing slight inferior offset (set on A).   The glenoid sphere was completely seated, and had engagement of the [facility] taper. I then turned my attention back to the humerus.  I  sequentially broached, and then trialed, and was found to restore soft tissue tension, and it had 2 finger tightness. Therefore the above named components were selected. The shoulder felt stable throughout functional  motion.   I then impacted the real prosthesis into place, as well as the real humeral tray, and reduced the shoulder. The shoulder had excellent motion, and was stable, and I irrigated the wounds copiously.    I then used these to repair the subscapularis. This came down to bone.  I then irrigated the shoulder copiously once more, placed 1 g of vancomycin powder into the wound, repaired the deltopectoral interval with #2 FiberWire followed by subcutaneous Vicryl, then monocryl for the skin,  with Steri-Strips and sterile gauze for the skin. The patient was awakened and returned back in stable and satisfactory condition. There no complications and they tolerated the procedure well.  All counts were correct x2.  Disposition:  [name] will be admitted postoperatively to the orthopedics floor.  He will be under my service but with nephrology consulted for hemodialysis.  Hopeful to receive dialysis tomorrow, and then potentially discharge home and then continue with outpatient hemodialysis.  Otherwise okay to use the right upper extremity immediately for activities of daily living.  He will be maintaining sling while sleeping and when he is not performing ADLs.

## 2022-09-20 NOTE — Consult Note (Signed)
[facility] Renal Consultation Note    Indication for Consultation:  Management of ESRD/hemodialysis, anemia, hypertension/volume, and secondary hyperparathyroidism.  HPI: [name] is a 62 y.o. male with PMH including ESRD on dialysis, CHF, DM, DVT, R nephrectomy, HTN, who was admitted for elective right reverse shoulder arthroplasty for right shoulder rotator cuff arthroplasty. Surgery was completed today, patient seen in the PACU. Nephrology was consulted for management of ESRD during admission. Patient attends dialysis at [facility] on MWF schedule, last dialysis was Friday. Labs notable for K+ 4.2, BUN 43, Cr 12.1, Hgb 11.6. RN reports he did require some BP support intraoperatively but Bps have been variable.  VS otherwise stable.  Pt is still somnolent post op but able to answer yes/no questions. Confirms he is still taking midodrine 10mg  TID. He denies SOB, CP, dizziness, abdominal pain, nausea, vomiting. Reports he is cold. No other concerns at this time.    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 Algie Coffer follows   Diabetes mellitus    pt. states he's borderline diabetic., no longer taking med,- off med. since 2013   Dialysis patient [facility])    Mon-Wed-Fri([facility])- Left AV fistula   Diverticulitis 03/2015   reoccurred in December 2016   Diverticulitis of intestine without perforation or abscess without bleeding 04/21/2015   Dizziness 11/22/2018   DVT (deep venous thrombosis) (HCC)    ESRD (end stage renal disease) (HCC)     GERD (gastroesophageal reflux disease)    Gout    Hemorrhage of left kidney 01/13/2018   History of nephrectomy 07/04/2012   History of right nephrectomy in 2002 for renal cell carcinoma    History of unilateral nephrectomy    Hypertension    Hypotension due to hypovolemia 06/04/2020   Low iron    Myocardial infarction [facility]) ?2006   Renal cell carcinoma    dialysis- MWF- [address]- Dr. Briant Cedar follows.   Renal insufficiency    Shortness of breath 05/19/2011   "at rest, lying down, w/exertion"   Stroke [facility]) 02/2011   05/19/11 denies residual   Umbilical hernia 05/19/2011   unrepaired   Wears glasses    Past Surgical History:  Procedure Laterality Date   ANAL FISTULOTOMY  08/15/2018   AV FISTULA PLACEMENT  03/29/2011   Procedure: ARTERIOVENOUS (AV) FISTULA CREATION;  Surgeon: Nilda Simmer, MD;  Location: [facility] OR;  Service: Vascular;  Laterality: Left;  LEFT RADIOCEPHALIC , Arteriovenous [phone])   CARDIAC CATHETERIZATION  05/21/11   CARDIAC CATHETERIZATION N/A 03/16/2016   Procedure: Left Heart Cath and Coronary Angiography;  Surgeon: Orpah Cobb, MD;  Location: [facility] INVASIVE CV LAB;  Service: Cardiovascular;  Laterality: N/A;   CHOLECYSTECTOMY N/A 02/12/2016   Procedure: LAPAROSCOPIC CHOLECYSTECTOMY WITH INTRAOPERATIVE CHOLANGIOGRAM;  Surgeon: Avel Peace, MD;  Location: [facility] OR;  Service: General;  Laterality: N/A;   COLONOSCOPY WITH PROPOFOL N/A 02/01/2017   Procedure: COLONOSCOPY WITH PROPOFOL;  Surgeon: Charna Elizabeth, MD;  Location: [facility] ENDOSCOPY;  Service: Endoscopy;  Laterality: N/A;   dialysis  cath placed     ESOPHAGOGASTRODUODENOSCOPY (EGD) WITH PROPOFOL N/A 12/02/2015   Procedure: ESOPHAGOGASTRODUODENOSCOPY (EGD) WITH PROPOFOL;  Surgeon: Charna Elizabeth, MD;  Location: [facility] ENDOSCOPY;  Service: Endoscopy;  Laterality: N/A;   EVALUATION UNDER ANESTHESIA WITH HEMORRHOIDECTOMY N/A 08/15/2018   Procedure: HEMORRHOID LIGATION/PEXY;  Surgeon: Karie Soda, MD;   Location: [facility] OR;  Service: General;  Laterality: N/A;   FINGER SURGERY     L pinkie finger- ORIF- /w remaining hardware - 1990's     FISTULOTOMY N/A 08/15/2018   Procedure: SUPERFICIAL ANAL FISTULOTOMY;  Surgeon: Karie Soda, MD;  Location: [facility] OR;  Service: General;  Laterality: N/A;   HEMORRHOID SURGERY     HERNIA REPAIR N/A 07/04/2012   Umbilical hernia repair   INSERTION OF DIALYSIS CATHETER N/A 01/27/2016   Procedure: INSERTION OF DIALYSIS CATHETER;  Surgeon: Maeola Harman, MD;  Location: [facility] OR;  Service: Vascular;  Laterality: N/A;   INSERTION OF MESH N/A 07/04/2012   Procedure: INSERTION OF MESH;  Surgeon: Lodema Pilot, DO;  Location: [facility] OR;  Service: General;  Laterality: N/A;   IR EMBO ART  VEN HEMORR LYMPH EXTRAV  INC GUIDE ROADMAPPING  01/13/2018   IR FLUORO GUIDE CV LINE RIGHT  01/13/2018   IR IVC FILTER PLMT / S&I /IMG GUID/MOD SED  01/27/2018   IR RENAL SELECTIVE  UNI INC S&I MOD SED  01/13/2018   IR US GUIDE VASC ACCESS RIGHT  01/13/2018   KIDNEY CYST REMOVAL  2019   LAPAROSCOPIC LYSIS OF ADHESIONS N/A 02/12/2016   Procedure: LAPAROSCOPIC LYSIS OF ADHESIONS;  Surgeon: Avel Peace, MD;  Location: [facility] OR;  Service: General;  Laterality: N/A;   LEFT AND RIGHT HEART CATHETERIZATION WITH CORONARY ANGIOGRAM N/A 08/04/2012   Procedure: LEFT AND RIGHT HEART CATHETERIZATION WITH CORONARY ANGIOGRAM;  Surgeon: Ricki Rodriguez, MD;  Location: [facility] CATH LAB;  Service: Cardiovascular;  Laterality: N/A;   NEPHRECTOMY  2000   right   REVISON OF ARTERIOVENOUS FISTULA Left 06/05/2013   Procedure: REVISON OF LEFT RADIOCEPHALIC  ARTERIOVENOUS FISTULA;  Surgeon: Fransisco Hertz, MD;  Location: [facility] OR;  Service: Vascular;  Laterality: Left;   REVISON OF ARTERIOVENOUS FISTULA Left 01/27/2016   Procedure: REVISION OF LEFT UPPER EXTREMITY ARTERIOVENOUS FISTULA;  Surgeon: Maeola Harman, MD;  Location: [facility] OR;  Service: Vascular;  Laterality: Left;   REVISON OF ARTERIOVENOUS FISTULA Left 08/03/2016    Procedure: REVISON OF Left arm ARTERIOVENOUS FISTULA;  Surgeon: Sherren Kerns, MD;  Location: [facility] OR;  Service: Vascular;  Laterality: Left;   REVISON OF ARTERIOVENOUS FISTULA Left 05/06/2017   Procedure: REVISION PLICATION OF ARTERIOVENOUS FISTULA LEFT ARM;  Surgeon: Larina Earthly, MD;  Location: [facility] OR;  Service: Vascular;  Laterality: Left;   REVISON OF ARTERIOVENOUS FISTULA Left 02/01/2019   Procedure: REVISION OF ARTERIOVENOUS FISTULA LEFT ARM;  Surgeon: Cephus Shelling, MD;  Location: [facility] OR;  Service: Vascular;  Laterality: Left;   REVISON OF ARTERIOVENOUS FISTULA Left 03/15/2019   Procedure: REVISION PLICATION OF ARTERIOVENOUS FISTULA LEFT ARM;  Surgeon: Cephus Shelling, MD;  Location: [facility] OR;  Service: Vascular;  Laterality: Left;   RIGHT HEART CATHETERIZATION N/A 05/21/2011   Procedure: RIGHT HEART CATH;  Surgeon: Ricki Rodriguez, MD;  [facility]: [facility] CATH LAB;  Service: Cardiovascular;  Laterality: N/A;   smashed  1990's   "left pinky; have a plate in there"   UMBILICAL HERNIA REPAIR N/A 07/04/2012   Procedure: LAPAROSCOPIC UMBILICAL HERNIA;  Surgeon: Lodema Pilot, DO;  Location: [facility]  OR;  Service: General;  Laterality: N/A;   US ECHOCARDIOGRAPHY  05/20/11   Family History  Problem Relation Age of Onset   Hypertension Mother    Kidney disease Mother    Social History:  reports that he has never smoked. He has never used smokeless tobacco. He reports current drug use. Drug: Oxycodone. He reports that he does not drink alcohol.  ROS: As per HPI otherwise negative.  Physical Exam: Vitals:   09/20/22 1225 09/20/22 1230 09/20/22 1235 09/20/22 1240  BP:  (!) 210/85 (!) 186/151 (!) 145/90  Pulse: 90 90 88 84  Resp: (!) 22 18 18 18   Temp:      TempSrc:      SpO2: 100% 100% 99% 100%  Weight:      Height:         General: WDWN male in NAD Head: Normocephalic, atraumatic, sclera non-icteric, mucus membranes are moist. Lungs: Clear bilaterally to auscultation without  wheezes, rales, or rhonchi. Breathing is unlabored on O2 via [place] Heart: RRR with normal S1, S2. No murmurs, rubs, or gallops appreciated. Abdomen: Soft, non-tender, non-distended with normoactive bowel sounds. No rebound/guarding. No obvious abdominal masses. Musculoskeletal:  Strength and tone appear normal for age. R shoulder with bandage, arm in sling Lower extremities: No edema b/l lower extremities Neuro: Somnolent but opens eyes to voice, answers questions appropriately Dialysis Access: LUE AVF + t/b  Allergies  Allergen Reactions   Ace Inhibitors Itching, Cough and Other (See Comments)   Prior to Admission medications   Medication Sig Start Date End Date Taking? Authorizing Provider  calcium acetate (PHOSLO) 667 MG capsule Take 1-3 capsules (667-2,001 mg total) by mouth See admin instructions. Take 2001 mg capsules with meals three times daily and 667 mg capsule with snacks. 11/23/18  Yes Orpah Cobb, MD  chlorhexidine (PERIDEX) 0.12 % solution 15 mLs by Mouth Rinse route 2 (two) times daily. 05/29/18  Yes [provider]  cinacalcet (SENSIPAR) 90 MG tablet Take 180 mg by mouth daily.   Yes [provider]  digoxin (LANOXIN) 0.125 MG tablet Take 1 tablet (0.125 mg total) by mouth every Monday, Wednesday, and Friday. 06/09/20  Yes Rai, Delene Ruffini, MD  Etelcalcetide HCl (PARSABIV IV) Etelcalcetide Brigitte Pulse) 07/05/22 06/15/23 Yes [provider]  gabapentin (NEURONTIN) 300 MG capsule Take 1 capsule (300 mg total) by mouth 3 (three) times daily. Patient taking differently: Take 300 mg by mouth at bedtime. 11/10/21  Yes [name], FNP  glucose blood test strip Use to check blood sugar 3 times a day. Dx code e11.65 10/13/21  Yes [name], FNP  HYDROcodone-acetaminophen [facility]) 10-325 MG tablet Take 0.5 tablets by mouth 2 (two) times daily as needed for moderate pain. 06/17/22  Yes [provider]  isosorbide dinitrate (ISORDIL) 10 MG tablet Take 10 mg by  mouth 2 (two) times daily. 07/07/21  Yes [provider]  lidocaine (LIDODERM) 5 % Place 1 patch onto the skin daily. Remove & Discard patch within 12 hours or as directed by MD 09/11/22  Yes Schuman, Beverly Gust, PA-C  loperamide (IMODIUM A-D) 2 MG tablet Take 1 tablet (2 mg total) by mouth 4 (four) times daily as needed for diarrhea or loose stools. Also available OTC. 06/09/20  Yes Rai, Ripudeep K, MD  metoprolol succinate (TOPROL-XL) 25 MG 24 hr tablet Take 1 tablet (25 mg total) by mouth daily. 06/09/20  Yes Rai, Ripudeep K, MD  metoprolol tartrate (LOPRESSOR) 25 MG tablet Take 25 mg by mouth  2 (two) times daily. 10/03/20  Yes [provider]  midodrine (PROAMATINE) 10 MG tablet Take 1 tablet (10 mg total) by mouth 3 (three) times daily. 06/09/20  Yes Rai, Ripudeep K, MD  multivitamin (RENA-VIT) TABS tablet Take 1 tablet by mouth at bedtime.   Yes [provider]  pantoprazole (PROTONIX) 40 MG tablet Take 40 mg by mouth 2 (two) times daily.   Yes [provider]  rifaximin (XIFAXAN) 550 MG TABS tablet Take 1 tablet (550 mg total) by mouth 3 (three) times daily. 07/15/22  Yes [name], FNP  sucroferric oxyhydroxide (VELPHORO) 500 MG chewable tablet Chew 500-1,000 mg by mouth See admin instructions. Takes 2 tablets with each meal and 1 tablet with snacks. 10/02/18  Yes [provider]  warfarin (COUMADIN) 7.5 MG tablet Take 7.5 mg by mouth daily.   Yes [provider]  albuterol (VENTOLIN HFA) 108 (90 Base) MCG/ACT inhaler Inhale 2 puffs into the lungs every 6 (six) hours as needed for wheezing or shortness of breath. 09/18/19   [name], FNP  Control Gel Formula Dressing (DUODERM CGF EXTRA THIN) MISC Apply 1 each topically daily as needed. 10/26/20   [name], FNP  diclofenac Sodium (VOLTAREN) 1 % GEL Apply 2 g topically 4 (four) times daily. Patient not taking: Reported on 09/10/2022 11/15/19   [name], FNP  ipratropium (ATROVENT) 0.06 %  nasal spray Place 2 sprays into both nostrils 4 (four) times daily. Patient not taking: Reported on 09/10/2022 11/15/19   [name], FNP  Lactobacillus (ACIDOPHILUS/BIFIDUS) WAFR Take 1 Wafer by mouth daily. Patient not taking: Reported on 09/10/2022 07/01/22   [name], FNP  Lancets [facility] DELICA PLUS [place]) MISC Use to check blood sugar 3 times a day. Dx code e11.65 10/13/21   [name], FNP  meclizine (ANTIVERT) 25 MG tablet Take 1 tablet (25 mg total) by mouth 3 (three) times daily as needed for dizziness. 04/08/21   Horton, Clabe Seal, DO  nitroGLYCERIN (NITROSTAT) 0.4 MG SL tablet Place 0.4 mg under the tongue every 5 (five) minutes as needed for chest pain.  05/20/15   [provider]  Nutritional Supplements (FEEDING SUPPLEMENT, NEPRO CARB STEADY,) LIQD Take 237 mLs by mouth daily. 03/22/16   [provider]  ondansetron (ZOFRAN ODT) 4 MG disintegrating tablet Take 1 tablet (4 mg total) by mouth every 8 (eight) hours as needed for nausea or vomiting. 06/09/20   Rai, Delene Ruffini, MD  oxyCODONE-acetaminophen (PERCOCET/ROXICET) 5-325 MG tablet Take 1 tablet by mouth every 4 (four) hours as needed for severe pain. 11/10/21   [name], FNP  UNABLE TO FIND Out patient physical therapy- vestibular therapy for BPPV 08/01/18   Calvert Cantor, MD   Current Facility-Administered Medications  Medication Dose Route Frequency Provider Last Rate Last Admin   0.9 %  sodium chloride infusion   Intravenous Continuous Eilene Ghazi, MD 375 mL/hr at 09/20/22 1446 Rate Change at 09/20/22 1446   [START ON 09/21/2022] Chlorhexidine Gluconate Cloth 2 % PADS 6 each  6 each Topical Q0600 Oretha Milch, PA-C       fentaNYL (SUBLIMAZE) injection 50 mcg  50 mcg Intravenous Once Eilene Ghazi, MD       insulin aspart (novoLOG) injection 0-7 Units  0-7 Units Subcutaneous Q2H PRN Eilene Ghazi, MD       metoprolol succinate (TOPROL-XL) 25 MG 24 hr tablet            Facility-Administered  Medications Ordered in Other Encounters  Medication Dose Route Frequency Provider Last Rate Last Admin   albumin human 5 % solution   Intravenous Continuous PRN Annabelle Harman A, CRNA   Stopped at 09/20/22 1420   bupivacaine liposome (EXPAREL) 1.3 % injection   Peri-NEURAL Anesthesia Intra-op Eilene Ghazi, MD   10 mL at 09/20/22 1225   bupivacaine(PF) (MARCAINE) 0.5 % injection   Peri-NEURAL Anesthesia Intra-op Eilene Ghazi, MD   15 mL at 09/20/22 1225   dexamethasone (DECADRON) injection   Intravenous Anesthesia Intra-op Annabelle Harman A, CRNA   5 mg at 09/20/22 1312   ePHEDrine sulfate (PF) 5mg /mL syringe   Intravenous Anesthesia Intra-op Annabelle Harman A, CRNA   10 mg at 09/20/22 1334   fentaNYL citrate (PF) (SUBLIMAZE) injection   Intravenous Anesthesia Intra-op Annabelle Harman A, CRNA   50 mcg at 09/20/22 1252   lidocaine 2% (20 mg/mL) 5 mL syringe   Intravenous Anesthesia Intra-op Annabelle Harman A, CRNA   100 mg at 09/20/22 1259   ondansetron (ZOFRAN) injection   Intravenous Anesthesia Intra-op Annabelle Harman A, CRNA   4 mg at 09/20/22 1434   phenylephrine (NEO-SYNEPHRINE) 20mg /NS premix infusion   Intravenous Continuous PRN Waynard Edwards, CRNA   Stopped at 09/20/22 1439   PHENYLephrine 80 mcg/ml in normal saline Adult IV Push Syringe (For Blood Pressure Support)   Intravenous Anesthesia Intra-op Annabelle Harman A, CRNA   160 mcg at 09/20/22 1334   propofol (DIPRIVAN) 10 mg/mL bolus/IV push   Intravenous Anesthesia Intra-op Annabelle Harman A, CRNA   100 mg at 09/20/22 1259   rocuronium bromide 10 mg/mL (PF) syringe   Intravenous Anesthesia Intra-op Annabelle Harman A, CRNA   60 mg at 09/20/22 1300   sugammadex sodium (BRIDION) injection   Intravenous Anesthesia Intra-op Annabelle Harman A, CRNA   250 mg at 09/20/22 1436   vasopressin (PITRESSIN) 20 UNIT/ML injection   Intravenous Anesthesia Intra-op Waynard Edwards, CRNA   1 Units at 09/20/22 1343   Labs: Basic Metabolic Panel: Recent  Labs  Lab 09/14/22 1200 09/20/22 1132  NA 137 134*  K 3.5 4.2  CL 93* 98  CO2 27  --   GLUCOSE 97 85  BUN 20 43*  CREATININE 7.55* 12.10*  CALCIUM 9.4  --    Liver Function Tests: Recent Labs  Lab 09/14/22 1200  AST 14*  ALT 13  ALKPHOS 147*  BILITOT 0.5  PROT 8.2*  ALBUMIN 3.1*   No results for input(s): "LIPASE", "AMYLASE" in the last 168 hours. No results for input(s): "AMMONIA" in the last 168 hours. CBC: Recent Labs  Lab 09/14/22 1200 09/20/22 1132  WBC 6.5  --   HGB 11.5* 11.6*  HCT 35.9* 34.0*  MCV 94.5  --   PLT 267  --    CBG: Recent Labs  Lab 09/14/22 1101 09/20/22 1036 09/20/22 1242  GLUCAP 98 83 87    Outpatient Dialysis Orders:  Center: [facility] on MWF. 180NRe BFR 450 DFR 600 EDW 106.5kg 2K 2.5Ca AVF 15g Heparin 4000 unit bolus Mircera IV q 2 weeks- last dose 09/03/22 Hectorol IV q HD Parsabiv IV q HD Velphoro 500mg  3 tabs PO TID  Assessment/Plan:  S/p Right total shoulder reverse arthoplasty on 09/20/22: Per ortho  ESRD:  Dialysis on MWF schedule, will plan for next dialysis this evening. Holding heparin today (post op)  Hypertension/volume: BP moderately elevated, UF goal 2.5L today as tolerated. Takes midodrine 10mg  TID and amiodarone for rate control.   Anemia: Hgb  11.6. No ESA indicated at this time, follow trend post op.   Metabolic bone disease: Will check calcium and phos levels. Continue hectorol and phos binders. Brigitte Pulse is not on formulary, resume at d/c. Sensipar is on home med list but is not currently taking- do not need to order here.   Nutrition:  Will need renal diet/fluid restrictions once tolerating PO A.flutter: On amiodarone and warfarin  Rogers Blocker, PA-C 09/20/2022, 2:47 PM  [facility] Pager: [phone]

## 2022-09-20 NOTE — Brief Op Note (Signed)
09/20/2022  2:26 PM  PATIENT:  [name]  62 y.o. male  PRE-OPERATIVE DIAGNOSIS:  Right shoulder rotator cuff arthropathy  POST-OPERATIVE DIAGNOSIS:  Right shoulder rotator cuff arthropathy  PROCEDURE:  Procedure(s) with comments: REVERSE SHOULDER ARTHROPLASTY (Right) - 120  SURGEON:  Surgeon(s) and Role:    * Aundria Rud, Noah Delaine, MD - Primary  PHYSICIAN ASSISTANT: Dion Saucier, PA-C   ANESTHESIA:   regional and general  EBL:  200 mL   BLOOD ADMINISTERED:none  DRAINS: none   LOCAL MEDICATIONS USED:  MARCAINE     SPECIMEN:  No Specimen  DISPOSITION OF SPECIMEN:  N/A  COUNTS:  YES  TOURNIQUET:  * No tourniquets in log *  DICTATION: .Note written in EPIC  PLAN OF CARE: Admit to inpatient   PATIENT DISPOSITION:  PACU - hemodynamically stable.   Delay start of Pharmacological VTE agent (>24hrs) due to surgical blood loss or risk of bleeding: not applicable

## 2022-09-20 NOTE — Anesthesia Procedure Notes (Signed)
Procedure Name: Intubation Date/Time: 09/20/2022 1:04 PM  Performed by: Waynard Edwards, CRNAPre-anesthesia Checklist: Patient identified, Emergency Drugs available, Suction available and Patient being monitored Patient Re-evaluated:Patient Re-evaluated prior to induction Oxygen Delivery Method: Circle system utilized Preoxygenation: Pre-oxygenation with 100% oxygen Induction Type: IV induction Ventilation: Mask ventilation without difficulty Laryngoscope Size: Miller and 3 Grade View: Grade I Tube type: Oral Tube size: 7.5 mm Number of attempts: 1 Airway Equipment and Method: Stylet and Oral airway Placement Confirmation: ETT inserted through vocal cords under direct vision, positive ETCO2 and breath sounds checked- equal and bilateral Secured at: 24 cm Tube secured with: Tape Dental Injury: Teeth and Oropharynx as per pre-operative assessment

## 2022-09-20 NOTE — Anesthesia Procedure Notes (Signed)
Anesthesia Regional Block: Interscalene brachial plexus block   Pre-Anesthetic Checklist: , timeout performed,  Correct Patient, Correct Site, Correct Laterality,  Correct Procedure, Correct Position, site marked,  Risks and benefits discussed,  Surgical consent,  Pre-op evaluation,  At surgeon's request and post-op pain management  Laterality: Right  Prep: chloraprep       Needles:  Injection technique: Single-shot  Needle Type: Echogenic Needle     Needle Length: 9cm      Additional Needles:   Procedures:,,,, ultrasound used (permanent image in chart),,     Nerve Stimulator or Paresthesia:  Response: 0.5 mA  Additional Responses:   Narrative:  Start time: 09/20/2022 12:17 PM End time: 09/20/2022 12:27 PM Injection made incrementally with aspirations every 5 mL.  Performed by: Personally  Anesthesiologist: Eilene Ghazi, MD  Additional Notes: Patient tolerated the procedure well without complications

## 2022-09-20 NOTE — H&P (Signed)
ORTHOPAEDIC CONSULTATION  REQUESTING PHYSICIAN: Yolonda Kida, MD  PCP:  [name], FNP  Chief Complaint: Right shoulder end-stage arthritis  HPI: [name] is a 62 y.o. male who complains of right shoulder pain and weakness.  Here today after failing exhaustive conservative treatment for reverse arthroplasty due to significant glenoid deformity.  No new complaints at this time.  Ready to proceed today.  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 Algie Coffer follows   Diabetes mellitus    pt. states he's borderline diabetic., no longer taking med,- off med. since 2013   Dialysis patient [facility])    Mon-Wed-Fri([facility])- Left AV fistula   Diverticulitis 03/2015   reoccurred in December 2016   Diverticulitis of intestine without perforation or abscess without bleeding 04/21/2015   Dizziness 11/22/2018   DVT (deep venous thrombosis) (HCC)    ESRD (end stage renal disease) (HCC)    GERD (gastroesophageal reflux disease)    Gout    Hemorrhage of left kidney 01/13/2018   History of nephrectomy 07/04/2012   History of right nephrectomy in 2002 for renal cell carcinoma    History of unilateral nephrectomy    Hypertension    Hypotension due to hypovolemia 06/04/2020   Low iron    Myocardial infarction [facility]) ?2006   Renal cell carcinoma    dialysis- M[facility]- [address]- Dr. Briant Cedar follows.   Renal insufficiency    Shortness of breath 05/19/2011   "at rest, lying down, w/exertion"   Stroke [facility]) 02/2011   05/19/11 denies residual   Umbilical  hernia 05/19/2011   unrepaired   Wears glasses    Past Surgical History:  Procedure Laterality Date   ANAL FISTULOTOMY  08/15/2018   AV FISTULA PLACEMENT  03/29/2011   Procedure: ARTERIOVENOUS (AV) FISTULA CREATION;  Surgeon: Nilda Simmer, MD;  [facility]: [facility] OR;  Service: Vascular;  Laterality: Left;  LEFT RADIOCEPHALIC , Arteriovenous [phone])   CARDIAC CATHETERIZATION  05/21/11   CARDIAC CATHETERIZATION N/A 03/16/2016   Procedure: Left Heart Cath and Coronary Angiography;  Surgeon: Orpah Cobb, MD;  [facility]: [facility] INVASIVE CV LAB;  Service: Cardiovascular;  Laterality: N/A;   CHOLECYSTECTOMY N/A 02/12/2016   Procedure: LAPAROSCOPIC CHOLECYSTECTOMY WITH INTRAOPERATIVE CHOLANGIOGRAM;  Surgeon: Avel Peace, MD;  Location: [facility] OR;  Service: General;  Laterality: N/A;   COLONOSCOPY WITH PROPOFOL N/A 02/01/2017   Procedure: COLONOSCOPY WITH PROPOFOL;  Surgeon: Charna Elizabeth, MD;  Location: [facility] ENDOSCOPY;  Service: Endoscopy;  Laterality: N/A;   dialysis cath placed     ESOPHAGOGASTRODUODENOSCOPY (EGD) WITH PROPOFOL N/A 12/02/2015   Procedure: ESOPHAGOGASTRODUODENOSCOPY (EGD) WITH PROPOFOL;  Surgeon: Charna Elizabeth, MD;  Location: [facility] ENDOSCOPY;  Service: Endoscopy;  Laterality: N/A;   EVALUATION UNDER ANESTHESIA WITH HEMORRHOIDECTOMY N/A 08/15/2018   Procedure: HEMORRHOID LIGATION/PEXY;  Surgeon: Karie Soda, MD;  Location: [facility] OR;  Service: General;  Laterality: N/A;   FINGER SURGERY     L pinkie finger- ORIF- /w remaining hardware - 1990's     FISTULOTOMY N/A 08/15/2018   Procedure: SUPERFICIAL ANAL FISTULOTOMY;  Surgeon: Karie Soda, MD;  Location: [facility] OR;  Service: General;  Laterality: N/A;   HEMORRHOID SURGERY     HERNIA REPAIR N/A 07/04/2012   Umbilical hernia repair   INSERTION OF DIALYSIS CATHETER N/A 01/27/2016   Procedure: INSERTION OF DIALYSIS CATHETER;  Surgeon: Maeola Harman, MD;  Location: [facility] OR;  Service: Vascular;  Laterality: N/A;   INSERTION OF MESH N/A  07/04/2012   Procedure: INSERTION OF MESH;  Surgeon: Lodema Pilot, DO;  Location: [facility] OR;  Service: General;  Laterality: N/A;   IR EMBO ART  VEN HEMORR LYMPH EXTRAV  INC GUIDE ROADMAPPING  01/13/2018   IR FLUORO GUIDE CV LINE RIGHT  01/13/2018   IR IVC FILTER PLMT / S&I /IMG GUID/MOD SED  01/27/2018   IR RENAL SELECTIVE  UNI INC S&I MOD SED  01/13/2018   IR US GUIDE VASC ACCESS RIGHT  01/13/2018   KIDNEY CYST REMOVAL  2019   LAPAROSCOPIC LYSIS OF ADHESIONS N/A 02/12/2016   Procedure: LAPAROSCOPIC LYSIS OF ADHESIONS;  Surgeon: Avel Peace, MD;  Location: [facility] OR;  Service: General;  Laterality: N/A;   LEFT AND RIGHT HEART CATHETERIZATION WITH CORONARY ANGIOGRAM N/A 08/04/2012   Procedure: LEFT AND RIGHT HEART CATHETERIZATION WITH CORONARY ANGIOGRAM;  Surgeon: Ricki Rodriguez, MD;  [facility]: [facility] CATH LAB;  Service: Cardiovascular;  Laterality: N/A;   NEPHRECTOMY  2000   right   REVISON OF ARTERIOVENOUS FISTULA Left 06/05/2013   Procedure: REVISON OF LEFT RADIOCEPHALIC  ARTERIOVENOUS FISTULA;  Surgeon: Fransisco Hertz, MD;  Location: [facility] OR;  Service: Vascular;  Laterality: Left;   REVISON OF ARTERIOVENOUS FISTULA Left 01/27/2016   Procedure: REVISION OF LEFT UPPER EXTREMITY ARTERIOVENOUS FISTULA;  Surgeon: Maeola Harman, MD;  Location: [facility] OR;  Service: Vascular;  Laterality: Left;   REVISON OF ARTERIOVENOUS FISTULA Left 08/03/2016   Procedure: REVISON OF Left arm ARTERIOVENOUS FISTULA;  Surgeon: Sherren Kerns, MD;  Location: [facility] OR;  Service: Vascular;  Laterality: Left;   REVISON OF ARTERIOVENOUS FISTULA Left 05/06/2017   Procedure: REVISION PLICATION OF ARTERIOVENOUS FISTULA LEFT ARM;  Surgeon: Larina Earthly, MD;  [facility] OR;  Service: Vascular;  Laterality: Left;   REVISON OF ARTERIOVENOUS FISTULA Left 02/01/2019   Procedure: REVISION OF ARTERIOVENOUS FISTULA LEFT ARM;  Surgeon: Cephus Shelling, MD;  Location[facility] OR;  Service: Vascular;  Laterality: Left;   REVISON OF ARTERIOVENOUS  FISTULA Left 03/15/2019   Procedure: REVISION PLICATION OF ARTERIOVENOUS FISTULA LEFT ARM;  Surgeon: Cephus Shelling, MD;  Location: [facility] OR;  Service: Vascular;  Laterality: Left;   RIGHT HEART CATHETERIZATION N/A 05/21/2011   Procedure: RIGHT HEART CATH;  Surgeon: Ricki Rodriguez, MD;  [facility]: [facility] CATH LAB;  Service: Cardiovascular;  Laterality: N/A;   smashed  1990's   "left pinky; have a plate in there"   UMBILICAL HERNIA REPAIR N/A 07/04/2012   Procedure: LAPAROSCOPIC UMBILICAL HERNIA;  Surgeon: Lodema Pilot, DO;  [facility]: [facility] OR;  Service: General;  Laterality: N/A;   US ECHOCARDIOGRAPHY  05/20/11   Social History   Socioeconomic History   Marital status: Married    Spouse name: Not on file   Number of children: Not on file   Years of education: Not on file   Highest education level: Not on file  Occupational History   Occupation: disability  Tobacco Use   Smoking status: Never   Smokeless tobacco: Never  Vaping Use   Vaping Use: Never used  Substance and Sexual Activity  Alcohol use: No    Alcohol/week: 0.0 standard drinks of alcohol   Drug use: Yes    Types: Oxycodone    Comment: Past hx-none in 20 yrs   Sexual activity: Yes  Other Topics Concern   Not on file  Social History Narrative   Not on file   Social Determinants of Health   Financial Resource Strain: Low Risk  (01/07/2022)   Overall Financial Resource Strain (CARDIA)    Difficulty of Paying Living Expenses: Not hard at all  Food Insecurity: No Food Insecurity (07/13/2022)   Hunger Vital Sign    Worried About Running Out of Food in the Last Year: Never true    Ran Out of Food in the Last Year: Never true  Transportation Needs: Unmet Transportation Needs (07/13/2022)   PRAPARE - Administrator, Civil Service (Medical): Yes    Lack of Transportation (Non-Medical): No  Physical Activity: Inactive (01/07/2022)   Exercise Vital Sign    Days of Exercise per Week: 0 days    Minutes of Exercise  per Session: 0 min  Stress: No Stress Concern Present (01/07/2022)   [employer] of Occupational Health - Occupational Stress Questionnaire    Feeling of Stress : Not at all  Social Connections: Not on file   Family History  Problem Relation Age of Onset   Hypertension Mother    Kidney disease Mother    Allergies  Allergen Reactions   Ace Inhibitors Itching, Cough and Other (See Comments)   Prior to Admission medications   Medication Sig Start Date End Date Taking? Authorizing Provider  calcium acetate (PHOSLO) 667 MG capsule Take 1-3 capsules (667-2,001 mg total) by mouth See admin instructions. Take 2001 mg capsules with meals three times daily and 667 mg capsule with snacks. 11/23/18  Yes Orpah Cobb, MD  chlorhexidine (PERIDEX) 0.12 % solution 15 mLs by Mouth Rinse route 2 (two) times daily. 05/29/18  Yes [provider]  cinacalcet (SENSIPAR) 90 MG tablet Take 180 mg by mouth daily.   Yes [provider]  digoxin (LANOXIN) 0.125 MG tablet Take 1 tablet (0.125 mg total) by mouth every Monday, Wednesday, and Friday. 06/09/20  Yes Rai, Delene Ruffini, MD  Etelcalcetide HCl (PARSABIV IV) Etelcalcetide Brigitte Pulse) 07/05/22 06/15/23 Yes [provider]  gabapentin (NEURONTIN) 300 MG capsule Take 1 capsule (300 mg total) by mouth 3 (three) times daily. Patient taking differently: Take 300 mg by mouth at bedtime. 11/10/21  Yes [name], FNP  glucose blood test strip Use to check blood sugar 3 times a day. Dx code e11.65 10/13/21  Yes [name], FNP  HYDROcodone-acetaminophen [facility]) 10-325 MG tablet Take 0.5 tablets by mouth 2 (two) times daily as needed for moderate pain. 06/17/22  Yes [provider]  isosorbide dinitrate (ISORDIL) 10 MG tablet Take 10 mg by mouth 2 (two) times daily. 07/07/21  Yes [provider]  lidocaine (LIDODERM) 5 % Place 1 patch onto the skin daily. Remove & Discard patch within 12 hours or as directed by MD 09/11/22  Yes  Schuman, Beverly Gust, PA-C  loperamide (IMODIUM A-D) 2 MG tablet Take 1 tablet (2 mg total) by mouth 4 (four) times daily as needed for diarrhea or loose stools. Also available OTC. 06/09/20  Yes Rai, Ripudeep K, MD  metoprolol succinate (TOPROL-XL) 25 MG 24 hr tablet Take 1 tablet (25 mg total) by mouth daily. 06/09/20  Yes Rai, Ripudeep K, MD  metoprolol tartrate (LOPRESSOR) 25 MG tablet Take 25  mg by mouth 2 (two) times daily. 10/03/20  Yes [provider]  midodrine (PROAMATINE) 10 MG tablet Take 1 tablet (10 mg total) by mouth 3 (three) times daily. 06/09/20  Yes Rai, Ripudeep K, MD  multivitamin (RENA-VIT) TABS tablet Take 1 tablet by mouth at bedtime.   Yes [provider]  pantoprazole (PROTONIX) 40 MG tablet Take 40 mg by mouth 2 (two) times daily.   Yes [provider]  rifaximin (XIFAXAN) 550 MG TABS tablet Take 1 tablet (550 mg total) by mouth 3 (three) times daily. 07/15/22  Yes [name], FNP  sucroferric oxyhydroxide (VELPHORO) 500 MG chewable tablet Chew 500-1,000 mg by mouth See admin instructions. Takes 2 tablets with each meal and 1 tablet with snacks. 10/02/18  Yes [provider]  warfarin (COUMADIN) 7.5 MG tablet Take 7.5 mg by mouth daily.   Yes [provider]  albuterol (VENTOLIN HFA) 108 (90 Base) MCG/ACT inhaler Inhale 2 puffs into the lungs every 6 (six) hours as needed for wheezing or shortness of breath. 09/18/19   [name], FNP  Control Gel Formula Dressing (DUODERM CGF EXTRA THIN) MISC Apply 1 each topically daily as needed. 10/26/20   [name], FNP  diclofenac Sodium (VOLTAREN) 1 % GEL Apply 2 g topically 4 (four) times daily. Patient not taking: Reported on 09/10/2022 11/15/19   [name], FNP  ipratropium (ATROVENT) 0.06 % nasal spray Place 2 sprays into both nostrils 4 (four) times daily. Patient not taking: Reported on 09/10/2022 11/15/19   [name], FNP  Lactobacillus (ACIDOPHILUS/BIFIDUS) WAFR Take 1 Wafer by  mouth daily. Patient not taking: Reported on 09/10/2022 07/01/22   [name], FNP  Lancets [facility] DELICA PLUS [place]) MISC Use to check blood sugar 3 times a day. Dx code e11.65 10/13/21   [name], FNP  meclizine (ANTIVERT) 25 MG tablet Take 1 tablet (25 mg total) by mouth 3 (three) times daily as needed for dizziness. 04/08/21   Horton, Clabe Seal, DO  nitroGLYCERIN (NITROSTAT) 0.4 MG SL tablet Place 0.4 mg under the tongue every 5 (five) minutes as needed for chest pain.  05/20/15   [provider]  Nutritional Supplements (FEEDING SUPPLEMENT, NEPRO CARB STEADY,) LIQD Take 237 mLs by mouth daily. 03/22/16   [provider]  ondansetron (ZOFRAN ODT) 4 MG disintegrating tablet Take 1 tablet (4 mg total) by mouth every 8 (eight) hours as needed for nausea or vomiting. 06/09/20   Rai, Delene Ruffini, MD  oxyCODONE-acetaminophen (PERCOCET/ROXICET) 5-325 MG tablet Take 1 tablet by mouth every 4 (four) hours as needed for severe pain. 11/10/21   [name], FNP  UNABLE TO FIND Out patient physical therapy- vestibular therapy for BPPV 08/01/18   Calvert Cantor, MD   No results found.  Positive ROS: All other systems have been reviewed and were otherwise negative with the exception of those mentioned in the HPI and as above.  Physical Exam: General: Alert, no acute distress Cardiovascular: No pedal edema Respiratory: No cyanosis, no use of accessory musculature GI: No organomegaly, abdomen is soft and non-tender Skin: No lesions in the area of chief complaint Neurologic: Sensation intact distally Psychiatric: Patient is competent for consent with normal mood and affect Lymphatic: No axillary or cervical lymphadenopathy  MUSCULOSKELETAL: Right upper extremity is warm and well-perfused.  No open wounds or lesions.  Neurovascular intact distally.  Assessment: Right shoulder osteoarthritis  Plan: Plan to proceed today with reverse arthroplasty for definitive treatment.  We  again reviewed the risk  benefits of the procedure.  This include but not limited to bleeding, infection, damage to surrounding nerves and vessels, stiffness, dislocation, fracture, persistent pain and weakness.  Need for revision surgery as well as the risk of anesthesia.  He has provided informed consent.   Plan to admit postoperatively for routine postsurgical care.  He will also need hemodialysis while admitted.    Yolonda Kida, MD Cell [phone]    09/20/2022 12:15 PM

## 2022-09-20 NOTE — Anesthesia Procedure Notes (Signed)
Anesthesia Procedure Image    

## 2022-09-20 NOTE — Progress Notes (Signed)
Spoke with Dr. Okey Dupre regarding IV site. OK to use foot for IV.

## 2022-09-20 NOTE — Anesthesia Postprocedure Evaluation (Signed)
Anesthesia Post Note  Patient: [name]  Procedure(s) Performed: REVERSE SHOULDER ARTHROPLASTY (Right: Shoulder)     Patient location during evaluation: PACU Anesthesia Type: General Level of consciousness: awake and alert Pain management: pain level controlled Vital Signs Assessment: post-procedure vital signs reviewed and stable Respiratory status: spontaneous breathing, nonlabored ventilation, respiratory function stable and patient connected to nasal cannula oxygen Cardiovascular status: blood pressure returned to baseline and stable Postop Assessment: no apparent nausea or vomiting Anesthetic complications: no  No notable events documented.  Last Vitals:  Vitals:   09/20/22 1451 09/20/22 1500  BP: (!) 171/71 (!) 209/63  Pulse: 86 86  Resp: 14 (!) 23  Temp: (!) 35.9 C   SpO2: (!) 87% 98%    Last Pain:  Vitals:   09/20/22 1451  TempSrc:   PainSc: Asleep                 Alayziah Tangeman S

## 2022-09-20 NOTE — Transfer of Care (Signed)
Immediate Anesthesia Transfer of Care Note  Patient: [name]  Procedure(s) Performed: REVERSE SHOULDER ARTHROPLASTY (Right: Shoulder)  Patient Location: PACU  Anesthesia Type:GA combined with regional for post-op pain  Level of Consciousness: drowsy and patient cooperative  Airway & Oxygen Therapy: Patient Spontanous Breathing and Patient connected to nasal cannula oxygen  Post-op Assessment: Report given to RN and Post -op Vital signs reviewed and stable  Post vital signs: Reviewed and stable  Last Vitals:  Vitals Value Taken Time  BP 171/71 09/20/22 1451  Temp    Pulse 87 09/20/22 1453  Resp 23 09/20/22 1453  SpO2 92 % 09/20/22 1453  Vitals shown include unvalidated device data.  Last Pain:  Vitals:   09/20/22 1153  TempSrc:   PainSc: 10-Worst pain ever      Patients Stated Pain Goal: 2 (09/20/22 1153)  Complications: No notable events documented.

## 2022-09-21 ENCOUNTER — Encounter (HOSPITAL_COMMUNITY): Payer: Self-pay | Admitting: Orthopedic Surgery

## 2022-09-21 DIAGNOSIS — Z96611 Presence of right artificial shoulder joint: Secondary | ICD-10-CM | POA: Diagnosis not present

## 2022-09-21 DIAGNOSIS — Z86718 Personal history of other venous thrombosis and embolism: Secondary | ICD-10-CM | POA: Diagnosis not present

## 2022-09-21 DIAGNOSIS — Z8673 Personal history of transient ischemic attack (TIA), and cerebral infarction without residual deficits: Secondary | ICD-10-CM | POA: Diagnosis not present

## 2022-09-21 DIAGNOSIS — I509 Heart failure, unspecified: Secondary | ICD-10-CM | POA: Diagnosis not present

## 2022-09-21 DIAGNOSIS — I2510 Atherosclerotic heart disease of native coronary artery without angina pectoris: Secondary | ICD-10-CM | POA: Diagnosis not present

## 2022-09-21 DIAGNOSIS — E1122 Type 2 diabetes mellitus with diabetic chronic kidney disease: Secondary | ICD-10-CM | POA: Diagnosis not present

## 2022-09-21 DIAGNOSIS — N250 Renal osteodystrophy: Secondary | ICD-10-CM | POA: Diagnosis not present

## 2022-09-21 DIAGNOSIS — D631 Anemia in chronic kidney disease: Secondary | ICD-10-CM | POA: Diagnosis not present

## 2022-09-21 DIAGNOSIS — I120 Hypertensive chronic kidney disease with stage 5 chronic kidney disease or end stage renal disease: Secondary | ICD-10-CM | POA: Diagnosis not present

## 2022-09-21 DIAGNOSIS — M12811 Other specific arthropathies, not elsewhere classified, right shoulder: Secondary | ICD-10-CM | POA: Diagnosis not present

## 2022-09-21 DIAGNOSIS — I132 Hypertensive heart and chronic kidney disease with heart failure and with stage 5 chronic kidney disease, or end stage renal disease: Secondary | ICD-10-CM | POA: Diagnosis not present

## 2022-09-21 DIAGNOSIS — N186 End stage renal disease: Secondary | ICD-10-CM | POA: Diagnosis not present

## 2022-09-21 DIAGNOSIS — Z992 Dependence on renal dialysis: Secondary | ICD-10-CM | POA: Diagnosis not present

## 2022-09-21 LAB — CBC
HCT: 30.1 % — ABNORMAL LOW (ref 39.0–52.0)
Hemoglobin: 9.6 g/dL — ABNORMAL LOW (ref 13.0–17.0)
MCH: 30.2 pg (ref 26.0–34.0)
MCHC: 31.9 g/dL (ref 30.0–36.0)
MCV: 94.7 fL (ref 80.0–100.0)
Platelets: 306 10*3/uL (ref 150–400)
RBC: 3.18 MIL/uL — ABNORMAL LOW (ref 4.22–5.81)
RDW: 15.4 % (ref 11.5–15.5)
WBC: 9 10*3/uL (ref 4.0–10.5)
nRBC: 0 % (ref 0.0–0.2)

## 2022-09-21 LAB — HEPATITIS B SURFACE ANTIGEN: Hepatitis B Surface Ag: NONREACTIVE

## 2022-09-21 LAB — RENAL FUNCTION PANEL
Albumin: 2.8 g/dL — ABNORMAL LOW (ref 3.5–5.0)
Anion gap: 16 — ABNORMAL HIGH (ref 5–15)
BUN: 53 mg/dL — ABNORMAL HIGH (ref 6–20)
CO2: 22 mmol/L (ref 22–32)
Calcium: 8.7 mg/dL — ABNORMAL LOW (ref 8.9–10.3)
Chloride: 95 mmol/L — ABNORMAL LOW (ref 98–111)
Creatinine, Ser: 10.54 mg/dL — ABNORMAL HIGH (ref 0.61–1.24)
GFR, Estimated: 5 mL/min — ABNORMAL LOW (ref >60)
Glucose, Bld: 145 mg/dL — ABNORMAL HIGH (ref 70–99)
Phosphorus: 6.2 mg/dL — ABNORMAL HIGH (ref 2.5–4.6)
Potassium: 4.7 mmol/L (ref 3.5–5.1)
Sodium: 133 mmol/L — ABNORMAL LOW (ref 135–145)

## 2022-09-21 LAB — GLUCOSE, CAPILLARY
Glucose-Capillary: 110 mg/dL — ABNORMAL HIGH (ref 70–99)
Glucose-Capillary: 111 mg/dL — ABNORMAL HIGH (ref 70–99)

## 2022-09-21 LAB — PROTIME-INR
INR: 1.3 — ABNORMAL HIGH (ref 0.8–1.2)
Prothrombin Time: 16.1 seconds — ABNORMAL HIGH (ref 11.4–15.2)

## 2022-09-21 MED ORDER — LIDOCAINE-PRILOCAINE 2.5-2.5 % EX CREA: 1.0000 | TOPICAL_CREAM | CUTANEOUS | Status: DC | PRN

## 2022-09-21 MED ORDER — LOPERAMIDE HCL 2 MG PO CAPS: 2.0000 mg | ORAL_CAPSULE | ORAL | Status: DC | PRN

## 2022-09-21 MED ORDER — HYDROCODONE-ACETAMINOPHEN 7.5-325 MG PO TABS: 1.0000 | ORAL_TABLET | Freq: Four times a day (QID) | ORAL | 0 refills | Status: DC | PRN

## 2022-09-21 MED ORDER — ALTEPLASE 2 MG IJ SOLR: 2.0000 mg | Freq: Once | INTRAMUSCULAR | Status: DC | PRN

## 2022-09-21 MED ORDER — ANTICOAGULANT SODIUM CITRATE 4% (200MG/5ML) IV SOLN: 5.0000 mL | Status: DC | PRN

## 2022-09-21 MED ORDER — CHLORHEXIDINE GLUCONATE CLOTH 2 % EX PADS: 6.0000 | MEDICATED_PAD | Freq: Every day | CUTANEOUS | Status: DC

## 2022-09-21 MED ORDER — PENTAFLUOROPROP-TETRAFLUOROETH EX AERO: 1.0000 | INHALATION_SPRAY | CUTANEOUS | Status: DC | PRN

## 2022-09-21 MED ORDER — DIPHENHYDRAMINE HCL 25 MG PO CAPS
25.0000 mg | ORAL_CAPSULE | Freq: Three times a day (TID) | ORAL | Status: DC | PRN
  Administered 2022-09-21: 25 mg via ORAL

## 2022-09-21 MED ORDER — DARBEPOETIN ALFA 40 MCG/0.4ML IJ SOSY: 40.0000 ug | PREFILLED_SYRINGE | INTRAMUSCULAR | Status: DC

## 2022-09-21 MED ORDER — HEPARIN SODIUM (PORCINE) 1000 UNIT/ML DIALYSIS: 1000.0000 [IU] | INTRAMUSCULAR | Status: DC | PRN

## 2022-09-21 MED ORDER — LIDOCAINE HCL (PF) 1 % IJ SOLN: 5.0000 mL | INTRAMUSCULAR | Status: DC | PRN

## 2022-09-21 NOTE — Care Management Obs Status (Signed)
MEDICARE OBSERVATION STATUS NOTIFICATION   Patient Details  Name: [name] MRN: [id number] Date of Birth: [birth year]   Medicare Observation Status Notification Given:  Yes    Lawerance Sabal, RN 09/21/2022, 10:57 AM

## 2022-09-21 NOTE — Care Management CC44 (Signed)
Condition Code 44 Documentation Completed  Patient Details  Name: BEAUFORD LANDO MRN: [id number] Date of Birth: 1960-11-01   Condition Code 44 given:  Yes Patient signature on Condition Code 44 notice:  Yes Documentation of 2 MD's agreement:  Yes Code 44 added to claim:  Yes    Lawerance Sabal, RN 09/21/2022, 10:57 AM

## 2022-09-21 NOTE — Plan of Care (Signed)
[facility] Patient Discharge Orders- [facility] CLINIC: [facility]  Patient's name: [name] Admit/DC Dates: 09/20/2022 - 09/21/22  Discharge Diagnoses: Right shoulder arthoplasty   Aranesp: Given: No   Date and amount of last dose: N/A  Last Hgb: 9.6 PRBC's Given: No Date/# of units: No ESA dose for discharge: none, see below IV Iron dose at discharge: none  Heparin change: hold for 1 week post op then resume same dose  EDW Change: No New EDW:   [place] Change: No  Access intervention/Change: No Details:  Hectorol/Calcitriol change: No  Discharge Labs: Calcium: 8.7 Phosphorus: 6.2 Albumin: 2.8 K+: 4.7  IV Antibiotics: No  Details:  On Coumadin?: Yes Last INR: 1.5 Not managed by [facility]   OTHER/APPTS/LAB ORDERS: Check hgb next HD, may need to start ESA post op   D/C Meds to be reconciled by nurse after every discharge.  Completed By: Rogers Blocker, PA-C 09/21/2022, 12:07 PM  [facility] Pager: [phone]  Reviewed by: MD:______ RN_______

## 2022-09-21 NOTE — Progress Notes (Signed)
   Subjective:  [name] is a 62 y.o. male, 1 Day Post-Op   s/p Procedure(s): REVERSE SHOULDER ARTHROPLASTY   Patient reports pain as moderate.  Currently in dialysis, block wearing off.  Reports arm pain is throbbing.  Denies shortness of breath.  Objective:   VITALS:   Vitals:   09/21/22 1047 09/21/22 1117 09/21/22 1145 09/21/22 1147  BP: (!) 158/85 (!) 150/94 (!) 183/133 (!) 150/85  Pulse: 88 89 90 90  Resp: 13 14 14 13   Temp:   97.9 F (36.6 C)   TempSrc:   Oral   SpO2: 93% 93% 92% 91%  Weight:    107.2 kg  Height:      Laying in hospital bed, no acute distress  RUE:  , Intact Aquacel to the right upper extremity, able to move elbow, wrist, and digits.  Neurovascularly intact distally.  Bilateral calves soft, supple, no tenderness.  Lab Results  Component Value Date   WBC 9.0 09/21/2022   HGB 9.6 (L) 09/21/2022   HCT 30.1 (L) 09/21/2022   MCV 94.7 09/21/2022   PLT 306 09/21/2022   BMET    Component Value Date/Time   NA 133 (L) 09/21/2022 0657   NA 139 03/16/2022 1018   K 4.7 09/21/2022 0657   CL 95 (L) 09/21/2022 0657   CO2 22 09/21/2022 0657   GLUCOSE 145 (H) 09/21/2022 0657   BUN 53 (H) 09/21/2022 0657   BUN 39 (H) 03/16/2022 1018   CREATININE 10.54 (H) 09/21/2022 0657   CALCIUM 8.7 (L) 09/21/2022 0657   EGFR 6 (L) 03/16/2022 1018   GFRNONAA 5 (L) 09/21/2022 0657     Assessment/Plan: 1 Day Post-Op   Principal Problem:   History of reverse total replacement of right shoulder joint   Advance diet Up with therapy  Patient returning to the floor shortly, OT is planned to do an  eval and as long as he can get up from the bedside safely could be good to discharge home.  Okay to use a walker for ambulation. Sling while sleeping, ok to remove if using walker, resting while awake.  BP has been variable, looks to be more stable as of late though elevated.   Dispo: Hopeful discharge today,  Weightbearing Status: Nonweightbearing right upper  extremity DVT Prophylaxis: Heparin   Arbie Cookey 09/21/2022, 12:20 PM  Dion Saucier PA-C  Physician Assistant with Dr. Rebekah [place]

## 2022-09-21 NOTE — Discharge Instructions (Signed)
Orthopedic discharge instructions:  Weightbearing: No lifting more than 1 pound with the right arm.  Should be in your sling when out of the house, sleeping.  If resting on the couch while awake can remove the sling.  You can also remove the sling or slide it back if you are to use a walker for ambulation.  Bandage: The dressing is waterproof, you can take a normal shower but no submerging the bandage underwater.  Pat dry when done. We Will remove the bandage at your postoperative appointment in 2 weeks.  Pain control: Hydrocodone has been sent to your pharmacy for pain control.  Ice the shoulder 30 mins on, 30 minutes off  Motion/activity: You should move your elbow, wrist, and fingers multiple times a day, you can remove the sling for this.,  Wrist, and fingers multiple times a day, you can remove the sling for this.  You can use the arm for daily activities such as dressing, bathing, walking with a walker.  Follow-up and concerns: Call the office at [phone] to make your 2-week postoperative appointment if you do not have one with Dion Saucier PA or Dr. Aundria Rud.  Call the office for any refills, concerns.

## 2022-09-21 NOTE — Discharge Summary (Signed)
Patient ID: [name] MRN: [id number] DOB/AGE: December 16, 1960 62 y.o.  Admit date: 09/20/2022 Discharge date: 09/21/2022  Primary Diagnosis: Right shoulder osteoarthritis Admission Diagnoses: Status post right reverse total shoulder arthroplasty Past Medical History:  Diagnosis Date   Acute lower GI bleeding 06/21/2018   Acute respiratory failure with hypoxia (HCC) 01/13/2015   Arthritis    Atrial flutter (HCC)    Bile leak, postoperative 03/15/2016   Biliary dyskinesia 02/12/2016   Blind right eye    Hx: of partial blindness in right eye   Cardiomyopathy    CHF (congestive heart failure) (HCC)    Coronary artery disease    normal coronaries by 10/10/08 cath, Cardiac Cath 08-04-12 epic.Dr. Algie Coffer follows   Diabetes mellitus    pt. states he's borderline diabetic., no longer taking med,- off med. since 2013   Dialysis patient [facility])    Mon-Wed-Fri([facility])- Left AV fistula   Diverticulitis 03/2015   reoccurred in December 2016   Diverticulitis of intestine without perforation or abscess without bleeding 04/21/2015   Dizziness 11/22/2018   DVT (deep venous thrombosis) (HCC)    ESRD (end stage renal disease) (HCC)    GERD (gastroesophageal reflux disease)    Gout    Hemorrhage of left kidney 01/13/2018   History of nephrectomy 07/04/2012   History of right nephrectomy in 2002 for renal cell carcinoma    History of unilateral nephrectomy    Hypertension    Hypotension due to hypovolemia 06/04/2020   Low iron    Myocardial infarction [facility]) ?2006   Renal cell carcinoma    dialysis- [facility] [address]- Dr. Briant Cedar follows.   Renal insufficiency    Shortness of breath 05/19/2011   "at rest, lying down, w/exertion"   Stroke [facility]) 02/2011   05/19/11 denies residual   Umbilical hernia 05/19/2011   unrepaired   Wears glasses    Discharge Diagnoses:   Principal Problem:   History of reverse total replacement of right shoulder joint  Estimated body mass  index is 31.18 kg/m as calculated from the following:   Height as of this encounter: 6\' 1"  (1.854 m).   Weight as of this encounter: 107.2 kg.  Procedure:  Procedure(s) (LRB): REVERSE SHOULDER ARTHROPLASTY (Right)   Consults: nephrology  HPI: [name] is a 62 year old male who was treated for multiple years for right shoulder pain and weakness with conservative treatment.  After proper clearances were obtained patient elected to proceed with a right reverse total shoulder arthroplasty.  He presented to the hospital on 09/20/2022 for his procedure.  He was admitted overnight for monitoring and dialysis.  Laboratory Data: Admission on 09/20/2022, Discharged on 09/21/2022  Component Date Value Ref Range Status   Glucose-Capillary 09/20/2022 83  70 - 99 mg/dL Final   Glucose reference range applies only to samples taken after fasting for at least 8 hours.   Comment 1 09/20/2022 Notify RN   Final   Comment 2 09/20/2022 Document in Chart   Final   Prothrombin Time 09/20/2022 17.9 (H)  11.4 - 15.2 seconds Final   INR 09/20/2022 1.5 (H)  0.8 - 1.2 Final   Comment: (NOTE) INR goal varies based on device and disease states. Performed at [facility], [address], [place], [place] [address]    Sodium 09/20/2022 134 (L)  135 - 145 mmol/L Final   Potassium 09/20/2022 4.2  3.5 - 5.1 mmol/L Final   Chloride 09/20/2022 98  98 - 111 mmol/L Final  BUN 09/20/2022 43 (H)  6 - 20 mg/dL Final   Creatinine, Ser 09/20/2022 12.10 (H)  0.61 - 1.24 mg/dL Final   Glucose, Bld 16/02/9603 85  70 - 99 mg/dL Final   Glucose reference range applies only to samples taken after fasting for at least 8 hours.   Calcium, Ion 09/20/2022 1.11 (L)  1.15 - 1.40 mmol/L Final   TCO2 09/20/2022 27  22 - 32 mmol/L Final   Hemoglobin 09/20/2022 11.6 (L)  13.0 - 17.0 g/dL Final   HCT 54/01/8118 34.0 (L)  39.0 - 52.0 % Final   Glucose-Capillary 09/20/2022 87  70 - 99 mg/dL Final   Glucose reference range applies  only to samples taken after fasting for at least 8 hours.   Hep B S AB Quant (Post) 09/21/2022 <3.5 (L)  Immunity>9.9 mIU/mL Final   Comment: (NOTE)  Status of Immunity                     Anti-HBs Level  ------------------                     -------------- Inconsistent with Immunity                   0.0 - 9.9 Consistent with Immunity                          >9.9 Performed At: [facility] [address] [place], [place] [id number] Jolene Schimke MD ZH:[id number]    Glucose-Capillary 09/20/2022 89  70 - 99 mg/dL Final   Glucose reference range applies only to samples taken after fasting for at least 8 hours.   Comment 1 09/20/2022 Notify RN   Final   Comment 2 09/20/2022 Document in Chart   Final   Hepatitis B Surface Ag 09/21/2022 NON REACTIVE  NON REACTIVE Final   Performed at [facility], [address], [place], [place] [address]   Glucose-Capillary 09/20/2022 100 (H)  70 - 99 mg/dL Final   Glucose reference range applies only to samples taken after fasting for at least 8 hours.   Sodium 09/21/2022 133 (L)  135 - 145 mmol/L Final   Potassium 09/21/2022 4.7  3.5 - 5.1 mmol/L Final   Chloride 09/21/2022 95 (L)  98 - 111 mmol/L Final   CO2 09/21/2022 22  22 - 32 mmol/L Final   Glucose, Bld 09/21/2022 145 (H)  70 - 99 mg/dL Final   Glucose reference range applies only to samples taken after fasting for at least 8 hours.   BUN 09/21/2022 53 (H)  6 - 20 mg/dL Final   Creatinine, Ser 09/21/2022 10.54 (H)  0.61 - 1.24 mg/dL Final   Calcium 41/32/4401 8.7 (L)  8.9 - 10.3 mg/dL Final   Phosphorus 02/72/5366 6.2 (H)  2.5 - 4.6 mg/dL Final   Albumin 44/07/4740 2.8 (L)  3.5 - 5.0 g/dL Final   GFR, Estimated 09/21/2022 5 (L)  >60 mL/min Final   Comment: (NOTE) Calculated using the CKD-EPI Creatinine Equation (2021)    Anion gap 09/21/2022 16 (H)  5 - 15 Final   Performed at [facility], [address], [place], [place] [address]   WBC 09/21/2022 9.0  4.0 - 10.5 K/uL  Final   RBC 09/21/2022 3.18 (L)  4.22 - 5.81 MIL/uL Final   Hemoglobin 09/21/2022 9.6 (L)  13.0 - 17.0 g/dL Final   HCT 87/56/4332 30.1 (  L)  39.0 - 52.0 % Final   MCV 09/21/2022 94.7  80.0 - 100.0 fL Final   MCH 09/21/2022 30.2  26.0 - 34.0 pg Final   MCHC 09/21/2022 31.9  30.0 - 36.0 g/dL Final   RDW 40/98/1191 15.4  11.5 - 15.5 % Final   Platelets 09/21/2022 306  150 - 400 K/uL Final   nRBC 09/21/2022 0.0  0.0 - 0.2 % Final   Performed at [facility], [address], [place], [place] [address]   Glucose-Capillary 09/21/2022 110 (H)  70 - 99 mg/dL Final   Glucose reference range applies only to samples taken after fasting for at least 8 hours.   Prothrombin Time 09/21/2022 16.1 (H)  11.4 - 15.2 seconds Final   INR 09/21/2022 1.3 (H)  0.8 - 1.2 Final   Comment: (NOTE) INR goal varies based on device and disease states. Performed at [facility], [address] [place], [place] [address]    Glucose-Capillary 09/21/2022 111 (H)  70 - 99 mg/dL Final   Glucose reference range applies only to samples taken after fasting for at least 8 hours.  Hospital Outpatient Visit on 09/14/2022  Component Date Value Ref Range Status   Glucose-Capillary 09/14/2022 98  70 - 99 mg/dL Final   Glucose reference range applies only to samples taken after fasting for at least 8 hours.   Sodium 09/14/2022 137  135 - 145 mmol/L Final   Potassium 09/14/2022 3.5  3.5 - 5.1 mmol/L Final   Chloride 09/14/2022 93 (L)  98 - 111 mmol/L Final   CO2 09/14/2022 27  22 - 32 mmol/L Final   Glucose, Bld 09/14/2022 97  70 - 99 mg/dL Final   Glucose reference range applies only to samples taken after fasting for at least 8 hours.   BUN 09/14/2022 20  6 - 20 mg/dL Final   Creatinine, Ser 09/14/2022 7.55 (H)  0.61 - 1.24 mg/dL Final   Calcium 08/65/7846 9.4  8.9 - 10.3 mg/dL Final   Total Protein 96/29/5284 8.2 (H)  6.5 - 8.1 g/dL Final   Albumin 13/24/4010 3.1 (L)  3.5 - 5.0 g/dL Final   AST 27/25/3664 14 (L)   15 - 41 U/L Final   ALT 09/14/2022 13  0 - 44 U/L Final   Alkaline Phosphatase 09/14/2022 147 (H)  38 - 126 U/L Final   Total Bilirubin 09/14/2022 0.5  0.3 - 1.2 mg/dL Final   GFR, Estimated 09/14/2022 8 (L)  >60 mL/min Final   Comment: (NOTE) Calculated using the CKD-EPI Creatinine Equation (2021)    Anion gap 09/14/2022 17 (H)  5 - 15 Final   Performed at [facility], [address], [place], [place] [address]   WBC 09/14/2022 6.5  4.0 - 10.5 K/uL Final   RBC 09/14/2022 3.80 (L)  4.22 - 5.81 MIL/uL Final   Hemoglobin 09/14/2022 11.5 (L)  13.0 - 17.0 g/dL Final   HCT 42/59/5638 35.9 (L)  39.0 - 52.0 % Final   MCV 09/14/2022 94.5  80.0 - 100.0 fL Final   MCH 09/14/2022 30.3  26.0 - 34.0 pg Final   MCHC 09/14/2022 32.0  30.0 - 36.0 g/dL Final   RDW 75/64/3329 15.2  11.5 - 15.5 % Final   Platelets 09/14/2022 267  150 - 400 K/uL Final   nRBC 09/14/2022 0.0  0.0 - 0.2 % Final   Performed at [facility], [address] [place], [place] [address]  MRSA, PCR 09/14/2022 NEGATIVE  NEGATIVE Final   Staphylococcus aureus 09/14/2022 NEGATIVE  NEGATIVE Final   Comment: (NOTE) The Xpert SA Assay (FDA approved for NASAL specimens in patients 18 years of age and older), is one component of a comprehensive surveillance program. It is not intended to diagnose infection nor to guide or monitor treatment. Performed at [facility], [address], [place], [place] [address]      X-Rays:DG Shoulder Right Port  Result Date: 09/20/2022 CLINICAL DATA:  [id number] History of reverse total replacement of right shoulder joint [id number] EXAM: RIGHT SHOULDER - 1 VIEW COMPARISON:  CT 08/27/2022 FINDINGS: Postsurgical changes of reverse right total shoulder arthroplasty. Normal alignment. No evidence of loosening or periprosthetic fracture. Expected soft tissue changes. IMPRESSION: Postsurgical changes of reverse right total shoulder arthroplasty. No evidence of immediate hardware complication.  Normal alignment. Electronically Signed   By: Caprice Renshaw M.D.   On: 09/20/2022 16:16   DG Hip Unilat W or Wo Pelvis 2-3 Views Left  Result Date: 09/11/2022 CLINICAL DATA:  Pain EXAM: DG HIP (WITH OR WITHOUT PELVIS) 3V LEFT COMPARISON:  None Available. FINDINGS: Osteopenia. Moderate concentric joint space loss of the hips. No fracture or dislocation. Scattered vascular calcifications. IMPRESSION: Degenerative changes of the hips with concentric joint space loss. Osteopenia. Electronically Signed   By: Karen Kays M.D.   On: 09/11/2022 17:26   CT SHOULDER RIGHT WO CONTRAST  Result Date: 08/31/2022 CLINICAL DATA:  Right shoulder pain.  Pre-surgical evaluation. EXAM: CT OF THE UPPER RIGHT EXTREMITY WITHOUT CONTRAST TECHNIQUE: Multidetector CT imaging of the right shoulder was performed according to the standard [employer] protocol. RADIATION DOSE REDUCTION: This exam was performed according to the departmental dose-optimization program which includes automated exposure control, adjustment of the mA and/or kV according to patient size and/or use of iterative reconstruction technique. COMPARISON:  Shoulder radiograph 11/09/2018 and 01/05/2004. Chest CT 05/24/2022. FINDINGS: Bones/Joint/Cartilage There is chronic, progressive advanced arthropathy at the right glenohumeral joint with diffuse joint space narrowing and erosive changes of the humeral head and glenoid. Findings may relate to the patient's end-stage renal disease, crystalline or inflammatory arthropathy. No evidence of acute fracture, dislocation or humeral head osteonecrosis. Only a small joint effusion is evident. There is a possible loose body anteriorly. Advanced spondylosis noted in the cervical spine. Chronic spurring along the superior aspect of the distal clavicle. Ligaments Suboptimally assessed by CT. Muscles and Tendons Advanced fatty atrophy of the supraspinatus and infraspinatus muscles, likely due to chronic rotator cuff tears. There is  calcification at the musculotendinous junction of the supraspinatus. Soft tissues A small to moderate amount of fluid is present in the subacromial-subdeltoid bursa. No other focal periarticular fluid collections are identified. Atherosclerosis of the aorta and great vessels noted. The visualized right lung apex is clear. IMPRESSION: 1. Progressive advanced arthropathy at the right glenohumeral joint with erosive changes of the humeral head and glenoid. Findings may relate to the patient's end-stage renal disease, crystalline or inflammatory arthropathy. 2. Advanced fatty atrophy of the supraspinatus and infraspinatus muscles, likely due to chronic rotator cuff tears. 3. Small to moderate amount of fluid in the subacromial-subdeltoid bursa. 4.  Aortic Atherosclerosis (ICD10-I70.0). Electronically Signed   By: Carey Bullocks M.D.   On: 08/31/2022 10:06    EKG: Orders placed or performed during the hospital encounter of 05/24/22   ED EKG 12-Lead   ED EKG 12-Lead   EKG 12-Lead   EKG 12-Lead   EKG     Hospital  Course: [name] is a 83 y.o. who was admitted to Hospital. They were brought to the operating room on 09/20/2022 and underwent Procedure(s): REVERSE SHOULDER ARTHROPLASTY.  Patient tolerated the procedure well and was later transferred to the recovery room and then to the orthopaedic floor for postoperative care.  They were given PO and IV analgesics for pain control following their surgery.  They were given 24 hours of postoperative antibiotics of  Anti-infectives (From admission, onward)    Start     Dose/Rate Route Frequency Ordered Stop   09/21/22 1300  ceFAZolin (ANCEF) IVPB 1 g/50 mL premix        1 g 100 mL/hr over 30 Minutes Intravenous Every 24 hours 09/20/22 1611 09/21/22 1427   09/20/22 1700  rifaximin (XIFAXAN) tablet 550 mg  Status:  Discontinued        550 mg Oral 3 times daily 09/20/22 1611 09/21/22 2207   09/20/22 1416  vancomycin (VANCOCIN) powder  Status:  Discontinued           As needed 09/20/22 1419 09/20/22 1447   09/20/22 1045  ceFAZolin (ANCEF) IVPB 2g/100 mL premix        2 g 200 mL/hr over 30 Minutes Intravenous On call to O.R. 09/20/22 1042 09/20/22 1322      and started on DVT prophylaxis in the form of  heparin .   OT was ordered for total joint protocol.   Patient had an unevenftul night on the evening of surgery.  They started to get up OOB with therapy on day one. Nephrology was consulted, he underwent dialysis on 09/21/22. The patient had progressed with therapy and meeting their goals.    Patient was seen in rounds and was ready to go home.   Diet: Renal diet Activity:NWB RUE  Follow-up:in 2 weeks Disposition - Home Discharged Condition: good   Discharge Instructions     Call MD / Call 911   Complete by: As directed    If you experience chest pain or shortness of breath, CALL 911 and be transported to the hospital emergency room.  If you develope a fever above 101 F, pus (white drainage) or increased drainage or redness at the wound, or calf pain, call your surgeon's office.   Constipation Prevention   Complete by: As directed    Drink plenty of fluids.  Prune juice may be helpful.  You may use a stool softener, such as Colace (over the counter) 100 mg twice a day.  Use MiraLax (over the counter) for constipation as needed.   Diet - low sodium heart healthy   Complete by: As directed    Increase activity slowly as tolerated   Complete by: As directed    Post-operative opioid taper instructions:   Complete by: As directed    POST-OPERATIVE OPIOID TAPER INSTRUCTIONS: It is important to wean off of your opioid medication as soon as possible. If you do not need pain medication after your surgery it is ok to stop day one. Opioids include: Codeine, Hydrocodone(Norco, Vicodin), Oxycodone(Percocet, oxycontin) and hydromorphone amongst others.  Long term and even short term use of opiods can cause: Increased pain  response Dependence Constipation Depression Respiratory depression And more.  Withdrawal symptoms can include Flu like symptoms Nausea, vomiting And more Techniques to manage these symptoms Hydrate well Eat regular healthy meals Stay active Use relaxation techniques(deep breathing, meditating, yoga) Do Not substitute Alcohol to help with tapering If you have been on opioids for less than two  weeks and do not have pain than it is ok to stop all together.  Plan to wean off of opioids This plan should start within one week post op of your joint replacement. Maintain the same interval or time between taking each dose and first decrease the dose.  Cut the total daily intake of opioids by one tablet each day Next start to increase the time between doses. The last dose that should be eliminated is the evening dose.         Allergies as of 09/21/2022       Reactions   Ace Inhibitors Itching, Cough, Other (See Comments)        Medication List     STOP taking these medications    oxyCODONE-acetaminophen 5-325 MG tablet Commonly known as: PERCOCET/ROXICET       TAKE these medications    Acidophilus/Bifidus Wafr Take 1 Wafer by mouth daily.   albuterol 108 (90 Base) MCG/ACT inhaler Commonly known as: VENTOLIN HFA Inhale 2 puffs into the lungs every 6 (six) hours as needed for wheezing or shortness of breath.   calcium acetate 667 MG capsule Commonly known as: PHOSLO Take 1-3 capsules (667-2,001 mg total) by mouth See admin instructions. Take 2001 mg capsules with meals three times daily and 667 mg capsule with snacks.   chlorhexidine 0.12 % solution Commonly known as: PERIDEX 15 mLs by Mouth Rinse route 2 (two) times daily.   cinacalcet 90 MG tablet Commonly known as: SENSIPAR Take 180 mg by mouth daily.   diclofenac Sodium 1 % Gel Commonly known as: VOLTAREN Apply 2 g topically 4 (four) times daily.   digoxin 0.125 MG tablet Commonly known as:  LANOXIN Take 1 tablet (0.125 mg total) by mouth every Monday, Wednesday, and Friday.   DuoDERM CGF Extra Thin Misc Apply 1 each topically daily as needed.   feeding supplement (NEPRO CARB STEADY) Liqd Take 237 mLs by mouth daily.   gabapentin 300 MG capsule Commonly known as: Neurontin Take 1 capsule (300 mg total) by mouth 3 (three) times daily. What changed: when to take this   glucose blood test strip Use to check blood sugar 3 times a day. Dx code e11.65   HYDROcodone-acetaminophen 10-325 MG tablet Commonly known as: NORCO Take 0.5 tablets by mouth 2 (two) times daily as needed for moderate pain. What changed: Another medication with the same name was added. Make sure you understand how and when to take each.   HYDROcodone-acetaminophen 7.5-325 MG tablet Commonly known as: NORCO Take 1 tablet by mouth every 6 (six) hours as needed for moderate pain. What changed: You were already taking a medication with the same name, and this prescription was added. Make sure you understand how and when to take each.   ipratropium 0.06 % nasal spray Commonly known as: ATROVENT Place 2 sprays into both nostrils 4 (four) times daily.   isosorbide dinitrate 10 MG tablet Commonly known as: ISORDIL Take 10 mg by mouth 2 (two) times daily.   lidocaine 5 % Commonly known as: Lidoderm Place 1 patch onto the skin daily. Remove & Discard patch within 12 hours or as directed by MD   loperamide 2 MG tablet Commonly known as: IMODIUM A-D Take 1 tablet (2 mg total) by mouth 4 (four) times daily as needed for diarrhea or loose stools. Also available OTC.   meclizine 25 MG tablet Commonly known as: ANTIVERT Take 1 tablet (25 mg total) by mouth 3 (three) times daily as needed for  dizziness.   metoprolol succinate 25 MG 24 hr tablet Commonly known as: TOPROL-XL Take 1 tablet (25 mg total) by mouth daily.   metoprolol tartrate 25 MG tablet Commonly known as: LOPRESSOR Take 25 mg by mouth 2  (two) times daily.   midodrine 10 MG tablet Commonly known as: PROAMATINE Take 1 tablet (10 mg total) by mouth 3 (three) times daily.   multivitamin Tabs tablet Take 1 tablet by mouth at bedtime.   nitroGLYCERIN 0.4 MG SL tablet Commonly known as: NITROSTAT Place 0.4 mg under the tongue every 5 (five) minutes as needed for chest pain.   ondansetron 4 MG disintegrating tablet Commonly known as: Zofran ODT Take 1 tablet (4 mg total) by mouth every 8 (eight) hours as needed for nausea or vomiting.   OneTouch Delica Plus Lancet33G Misc Use to check blood sugar 3 times a day. Dx code e11.65   pantoprazole 40 MG tablet Commonly known as: PROTONIX Take 40 mg by mouth 2 (two) times daily.   PARSABIV IV Etelcalcetide (Parsabiv)   rifaximin 550 MG Tabs tablet Commonly known as: Xifaxan Take 1 tablet (550 mg total) by mouth 3 (three) times daily.   UNABLE TO FIND Out patient physical therapy- vestibular therapy for BPPV   Velphoro 500 MG chewable tablet Generic drug: sucroferric oxyhydroxide Chew 500-1,000 mg by mouth See admin instructions. Takes 2 tablets with each meal and 1 tablet with snacks.   warfarin 7.5 MG tablet Commonly known as: COUMADIN Take 7.5 mg by mouth daily.        Follow-up Information     Yolonda Kida, MD. Call in 2 week(s).   Specialty: Orthopedic Surgery Why: Call to make 2 week post op appointment. Contact information: [address] [place] [address] [phone]                 Signed: Dion Saucier PA-C  Orthopaedic Surgery 09/22/2022, 8:40 AM

## 2022-09-21 NOTE — Progress Notes (Signed)
D/C order noted. Contacted [facility] to advise clinic of pt's d/c today and that pt should resume care tomorrow.   [name] Renal Navigator [phone] 

## 2022-09-21 NOTE — Evaluation (Signed)
Occupational Therapy Evaluation Patient Details Name: [name] MRN: [id number] DOB: 06-04-1960 Today's Date: 09/21/2022   History of Present Illness [name] is a  63 y.o. male s/p elective R reverse total shoulder arthoplasty.   Clinical Impression   Pt s/p above mentioned surgery. Pt is at near PLOF, unable to perform WB on RUE. Pt lives at home with wife who helps with dressing/bathing, and sit to stand, has all DME at home needed to remain safe and functional with family. Pt daughter and grandson will be staying with him to assist as needed, Pt plans to DC today to home and go to OPOT for follow up. Pt instructed on AROM and movement precautions, given handout, instructed on sling use/wear, family educated on how to assist with donning/doffing sling. Pt cleared from acute OT.     Recommendations for follow up therapy are one component of a multi-disciplinary discharge planning process, led by the attending physician.  Recommendations may be updated based on patient status, additional functional criteria and insurance authorization.   Assistance Recommended at Discharge Intermittent Supervision/Assistance  Patient can return home with the following A little help with walking and/or transfers;A lot of help with bathing/dressing/bathroom;Assistance with cooking/housework;Assist for transportation;Help with stairs or ramp for entrance    Functional Status Assessment  Patient has had a recent decline in their functional status and demonstrates the ability to make significant improvements in function in a reasonable and predictable amount of time.  Equipment Recommendations  None recommended by OT    Recommendations for Other Services       Precautions / Restrictions Precautions Precautions: Fall Restrictions Weight Bearing Restrictions: Yes RUE Weight Bearing: Non weight bearing Other Position/Activity Restrictions: able to perform AROM, shoulder 0-90, no ABD, ER 0-30       Mobility Bed Mobility Overal bed mobility: Needs Assistance Bed Mobility: Supine to Sit, Sit to Supine     Supine to sit: Min assist Sit to supine: Min assist   General bed mobility comments: min A for getting out of bed to sit up, min A for assisting BLEs from sit to supine    Transfers Overall transfer level: Needs assistance Equipment used: Rolling walker (2 wheels) Transfers: Sit to/from Stand, Bed to chair/wheelchair/BSC Sit to Stand: Min assist     Step pivot transfers: Min guard     General transfer comment: min A to perform sit to stand, min guard for amulation/transfer      Balance Overall balance assessment: Needs assistance Sitting-balance support: No upper extremity supported, Feet supported Sitting balance-Leahy Scale: Good     Standing balance support: During functional activity Standing balance-Leahy Scale: Fair Standing balance comment: Pt able to stand and perform ADLs at sink level with LUE, can use cane with LUE as needed with min guard                           ADL either performed or assessed with clinical judgement   ADL Overall ADL's : Needs assistance/impaired Eating/Feeding: Set up   Grooming: Moderate assistance   Upper Body Bathing: Moderate assistance   Lower Body Bathing: Maximal assistance   Upper Body Dressing : Moderate assistance   Lower Body Dressing: Maximal assistance   Toilet Transfer: Minimal assistance;Rolling walker (2 wheels)   Toileting- Clothing Manipulation and Hygiene: Moderate assistance   Tub/ Shower Transfer: Minimal assistance   Functional mobility during ADLs: Supervision/safety General ADL Comments: Pt requires assistance with UB/LB dressing  due to recent R shoulder surgery, L shoudler pain/stiffness. Pt unable to reach B feet to perform LB dressing, mind A to assist to stand, min guard with mobility     Vision Baseline Vision/History: 1 Wears glasses (blind in R eye) Ability to See in  Adequate Light: 1 Impaired Patient Visual Report: No change from baseline       Perception     Praxis      Pertinent Vitals/Pain Pain Assessment Pain Assessment: 0-10 Pain Score: 8  Pain Location: R shoulder Pain Descriptors / Indicators: Aching, Constant, Sharp Pain Intervention(s): Monitored during session     Hand Dominance Right   Extremity/Trunk Assessment Upper Extremity Assessment Upper Extremity Assessment: Generalized weakness;RUE deficits/detail;LUE deficits/detail RUE Deficits / Details: recent reverse total shoulder RUE: Unable to fully assess due to pain;Unable to fully assess due to immobilization RUE Sensation: decreased light touch RUE Coordination: decreased gross motor LUE Deficits / Details: torn RTC, limited flexion/ABD, pain with AROM. LUE: Unable to fully assess due to pain LUE Sensation: WNL LUE Coordination: decreased gross motor   Lower Extremity Assessment Lower Extremity Assessment: Defer to PT evaluation       Communication Communication Communication: No difficulties   Cognition Arousal/Alertness: Awake/alert Behavior During Therapy: WFL for tasks assessed/performed Overall Cognitive Status: Within Functional Limits for tasks assessed                                       General Comments       Exercises     Shoulder Instructions      Home Living Family/patient expects to be discharged to:: Private residence Living Arrangements: Spouse/significant other Available Help at Discharge: Family Type of Home: House Home Access: Ramped entrance     Home Layout: One level     Bathroom Shower/Tub: Chief Strategy Officer: Standard     Home Equipment: Agricultural consultant (2 wheels);Shower seat;Grab bars - toilet;Grab bars - tub/shower;Wheelchair - manual   Additional Comments: Pt lives at home with wife, sister will be staying to assist as needed.      Prior Functioning/Environment Prior Level of Function  : Needs assist       Physical Assist : ADLs (physical);Mobility (physical)     Mobility Comments: uses RW ADLs Comments: wife and grandson helps with all dressing and bathing. Pt has difficulty dressing due to B shoulder pain/stiffness        OT Problem List: Decreased strength;Decreased range of motion;Decreased activity tolerance;Impaired UE functional use;Pain      OT Treatment/Interventions:      OT Goals(Current goals can be found in the care plan section) Acute Rehab OT Goals Patient Stated Goal: to return home and improve with BUE function. OT Goal Formulation: With patient Time For Goal Achievement: 09/28/22 Potential to Achieve Goals: Good  OT Frequency:      Co-evaluation              AM-PAC OT "6 Clicks" Daily Activity     Outcome Measure Help from another person eating meals?: A Little Help from another person taking care of personal grooming?: A Lot Help from another person toileting, which includes using toliet, bedpan, or urinal?: A Lot Help from another person bathing (including washing, rinsing, drying)?: A Lot Help from another person to put on and taking off regular upper body clothing?: A Lot Help from another person to put on and  taking off regular lower body clothing?: A Lot 6 Click Score: 13   End of Session Equipment Utilized During Treatment: Gait belt;Rolling walker (2 wheels) Nurse Communication: Mobility status  Activity Tolerance: Patient tolerated treatment well Patient left: in bed;with call bell/phone within reach;with family/visitor present  OT Visit Diagnosis: Muscle weakness (generalized) (M62.81);Pain Pain - Right/Left: Right Pain - part of body: Shoulder                Time: 1610-9604 OT Time Calculation (min): 33 min Charges:  OT General Charges $OT Visit: 1 Visit OT Evaluation $OT Eval Low Complexity: 1 Low OT Treatments $Self Care/Home Management : 8-22 mins  [address] OTR/L   Alexis Goodell 09/21/2022, 2:58  PM

## 2022-09-21 NOTE — Progress Notes (Signed)
[facility] Progress Note   Subjective:   Seen on HD, off schedule today (rolled over from yesterday). BP elevated but variable with leg cuff, tends to drop with HD. 3.5L over EDW by bed weights. He has dry mouth and has been drinking more water, agrees to increasing UF goal to compensate. Denies SOB, CP, palpitations, dizziness. Shoulder is sore.   Objective Vitals:   09/21/22 0634 09/21/22 0646 09/21/22 0717 09/21/22 0747  BP: (!) 179/92 (!) 196/89 (!) 188/82 (!) 148/85  Pulse: 91 87 89 90  Resp: 15 18 14 15   Temp: 97.6 F (36.4 C)     TempSrc: Oral     SpO2: 93% 95% 94% 92%  Weight: 110 kg     Height:       Physical Exam General: Alert male in NAD Heart: RRR, no murmurs, rubs or gallops Lungs: CTA bilaterally, on RA Abdomen: Soft, non-distended, +BS Extremities: No edema b/l lower extremities Dialysis Access:  AVF accessed  Additional Objective Labs: Basic Metabolic Panel: Recent Labs  Lab 09/14/22 1200 09/20/22 1132  NA 137 134*  K 3.5 4.2  CL 93* 98  CO2 27  --   GLUCOSE 97 85  BUN 20 43*  CREATININE 7.55* 12.10*  CALCIUM 9.4  --    Liver Function Tests: Recent Labs  Lab 09/14/22 1200  AST 14*  ALT 13  ALKPHOS 147*  BILITOT 0.5  PROT 8.2*  ALBUMIN 3.1*   No results for input(s): "LIPASE", "AMYLASE" in the last 168 hours. CBC: Recent Labs  Lab 09/14/22 1200 09/20/22 1132 09/21/22 0718  WBC 6.5  --  9.0  HGB 11.5* 11.6* 9.6*  HCT 35.9* 34.0* 30.1*  MCV 94.5  --  94.7  PLT 267  --  306   Blood Culture    Component Value Date/Time   SDES BLOOD RIGHT ANTECUBITAL 05/24/2022 2251   SPECREQUEST  05/24/2022 2251    BOTTLES DRAWN AEROBIC AND ANAEROBIC Blood Culture results may not be optimal due to an inadequate volume of blood received in culture bottles   CULT  05/24/2022 2251    NO GROWTH 6 DAYS Performed at [facility], [address], [place], [place] [address]    REPTSTATUS 05/30/2022 FINAL 05/24/2022 2251     Cardiac Enzymes: No results for input(s): "CKTOTAL", "CKMB", "CKMBINDEX", "TROPONINI" in the last 168 hours. CBG: Recent Labs  Lab 09/14/22 1101 09/20/22 1036 09/20/22 1242 09/20/22 1451 09/20/22 1615  GLUCAP 98 83 87 89 100*   Iron Studies: No results for input(s): "IRON", "TIBC", "TRANSFERRIN", "FERRITIN" in the last 72 hours. @lablastinr3 @ Studies/Results: DG Shoulder Right Port  Result Date: 09/20/2022 CLINICAL DATA:  [id number] History of reverse total replacement of right shoulder joint [id number] EXAM: RIGHT SHOULDER - 1 VIEW COMPARISON:  CT 08/27/2022 FINDINGS: Postsurgical changes of reverse right total shoulder arthroplasty. Normal alignment. No evidence of loosening or periprosthetic fracture. Expected soft tissue changes. IMPRESSION: Postsurgical changes of reverse right total shoulder arthroplasty. No evidence of immediate hardware complication. Normal alignment. Electronically Signed   By: Caprice Renshaw M.D.   On: 09/20/2022 16:16   Medications:  anticoagulant sodium citrate      ceFAZolin (ANCEF) IV      acetaminophen  1,000 mg Oral Q6H   Chlorhexidine Gluconate Cloth  6 each Topical Q0600   cinacalcet  180 mg Oral Daily   digoxin  0.125 mg Oral Q M,W,F   docusate sodium  100 mg Oral BID   [START ON 09/22/2022]  doxercalciferol  1 mcg Intravenous Q M,W,F-HD   fentaNYL (SUBLIMAZE) injection  50 mcg Intravenous Once   gabapentin  300 mg Oral QHS   isosorbide dinitrate  10 mg Oral BID   metoprolol succinate  25 mg Oral Daily   midodrine  10 mg Oral TID WC   pantoprazole  40 mg Oral BID   rifaximin  550 mg Oral TID   sucroferric oxyhydroxide  1,000 mg Oral TID WC   warfarin  11 mg Oral q1600   Warfarin - Physician Dosing Inpatient   Does not apply q1600    Outpatient Dialysis Orders:  Center: [facility] on MWF. 180NRe BFR 450 DFR 600 EDW 106.5kg 2K 2.5Ca AVF 15g Heparin 4000 unit bolus Mircera IV q 2 weeks- last dose 09/03/22 Hectorol IV q HD Parsabiv IV  q HD Velphoro 500mg  3 tabs PO TID  Assessment/Plan:  S/p Right total shoulder reverse arthoplasty on 09/20/22: Per ortho  ESRD:  Dialysis on MWF schedule, off schedule today, resume MWF schedule tomorrow.  Hypertension/volume: BP elevated but tends to drop with HD, UF goal 2.5L today as tolerated. Takes midodrine 10mg  TID and amiodarone for rate control.   Anemia: Hgb 9.6 post op. Not on ESA outpatient but post op Hgb decline, will start aranesp.    Metabolic bone disease: Will check calcium and phos levels. Continue hectorol and phos binders. Brigitte Pulse is not on formulary, resume at d/c. Sensipar is on home med list but is not currently taking- do not need to order here.   Nutrition:  Will need renal diet/fluid restrictions once tolerating PO A.flutter: On amiodarone and warfarin  Rogers Blocker, PA-C 09/21/2022, 8:12 AM  [facility] Pager: [phone]

## 2022-09-21 NOTE — Progress Notes (Signed)
Patient IV removed by myself, discharge packet given to patient and all belongings gathered by patients brother. Patient was taken off unit in wheelchair. All questions answered at this time, pt verbalized understanding.

## 2022-09-22 ENCOUNTER — Telehealth: Payer: Self-pay

## 2022-09-22 DIAGNOSIS — Z992 Dependence on renal dialysis: Secondary | ICD-10-CM | POA: Diagnosis not present

## 2022-09-22 DIAGNOSIS — N186 End stage renal disease: Secondary | ICD-10-CM | POA: Diagnosis not present

## 2022-09-22 DIAGNOSIS — N2581 Secondary hyperparathyroidism of renal origin: Secondary | ICD-10-CM | POA: Diagnosis not present

## 2022-09-22 DIAGNOSIS — E213 Hyperparathyroidism, unspecified: Secondary | ICD-10-CM | POA: Diagnosis not present

## 2022-09-22 LAB — HEPATITIS B SURFACE ANTIBODY, QUANTITATIVE: Hep B S AB Quant (Post): <3.5 m[IU]/mL — ABNORMAL LOW (ref >9.9)

## 2022-09-22 NOTE — Telephone Encounter (Signed)
Transition Care Management Follow-up Telephone Call Date of discharge and from where:  09/20/22 How have you been since you were released from the hospital? He is doing okay Any questions or concerns? Yes he needs something to help him with using the bathroom.  Items Reviewed: Did the pt receive and understand the discharge instructions provided? Yes  Medications obtained and verified? Yes  Other? No  Any new allergies since your discharge? No  Dietary orders reviewed? No Do you have support at home? Yes   Home Care and Equipment/Supplies: Were home health services ordered? yes If so, what is the name of the agency? He isnt sure yet  Has the agency set up a time to come to the patient's home? no Were any new equipment or medical supplies ordered?  No What is the name of the medical supply agency? N/a Were you able to get the supplies/equipment? no Do you have any questions related to the use of the equipment or supplies? No  Functional Questionnaire: (I = Independent and D = Dependent) ADLs: D  Bathing/Dressing- D  Meal Prep- D  Eating- D  Maintaining continence- I  Transferring/Ambulation- D  Managing Meds- D  Follow up appointments reviewed:  PCP Hospital f/u appt confirmed? Yes  Scheduled to see [name] on n/a @ n/a. Specialist Hospital f/u appt confirmed? No  Scheduled to see n/a on n/a @ n/a. Are transportation arrangements needed? No  If their condition worsens, is the pt aware to call PCP or go to the Emergency Dept.? Yes Was the patient provided with contact information for the PCP's office or ED? Yes Was to pt encouraged to call back with questions or concerns? Yes

## 2022-09-24 DIAGNOSIS — E213 Hyperparathyroidism, unspecified: Secondary | ICD-10-CM | POA: Diagnosis not present

## 2022-09-24 DIAGNOSIS — Z992 Dependence on renal dialysis: Secondary | ICD-10-CM | POA: Diagnosis not present

## 2022-09-24 DIAGNOSIS — N186 End stage renal disease: Secondary | ICD-10-CM | POA: Diagnosis not present

## 2022-09-24 DIAGNOSIS — N2581 Secondary hyperparathyroidism of renal origin: Secondary | ICD-10-CM | POA: Diagnosis not present

## 2022-09-27 DIAGNOSIS — Z992 Dependence on renal dialysis: Secondary | ICD-10-CM | POA: Diagnosis not present

## 2022-09-27 DIAGNOSIS — N2581 Secondary hyperparathyroidism of renal origin: Secondary | ICD-10-CM | POA: Diagnosis not present

## 2022-09-27 DIAGNOSIS — N186 End stage renal disease: Secondary | ICD-10-CM | POA: Diagnosis not present

## 2022-09-27 DIAGNOSIS — E213 Hyperparathyroidism, unspecified: Secondary | ICD-10-CM | POA: Diagnosis not present

## 2022-09-28 DIAGNOSIS — I5022 Chronic systolic (congestive) heart failure: Secondary | ICD-10-CM | POA: Diagnosis not present

## 2022-09-28 DIAGNOSIS — N186 End stage renal disease: Secondary | ICD-10-CM | POA: Diagnosis not present

## 2022-09-28 DIAGNOSIS — I2510 Atherosclerotic heart disease of native coronary artery without angina pectoris: Secondary | ICD-10-CM | POA: Diagnosis not present

## 2022-09-28 DIAGNOSIS — R072 Precordial pain: Secondary | ICD-10-CM | POA: Diagnosis not present

## 2022-09-29 DIAGNOSIS — E213 Hyperparathyroidism, unspecified: Secondary | ICD-10-CM | POA: Diagnosis not present

## 2022-09-29 DIAGNOSIS — Z992 Dependence on renal dialysis: Secondary | ICD-10-CM | POA: Diagnosis not present

## 2022-09-29 DIAGNOSIS — N186 End stage renal disease: Secondary | ICD-10-CM | POA: Diagnosis not present

## 2022-09-29 DIAGNOSIS — N2581 Secondary hyperparathyroidism of renal origin: Secondary | ICD-10-CM | POA: Diagnosis not present

## 2022-10-01 DIAGNOSIS — Z992 Dependence on renal dialysis: Secondary | ICD-10-CM | POA: Diagnosis not present

## 2022-10-01 DIAGNOSIS — N186 End stage renal disease: Secondary | ICD-10-CM | POA: Diagnosis not present

## 2022-10-01 DIAGNOSIS — N2581 Secondary hyperparathyroidism of renal origin: Secondary | ICD-10-CM | POA: Diagnosis not present

## 2022-10-04 DIAGNOSIS — Z992 Dependence on renal dialysis: Secondary | ICD-10-CM | POA: Diagnosis not present

## 2022-10-04 DIAGNOSIS — N2581 Secondary hyperparathyroidism of renal origin: Secondary | ICD-10-CM | POA: Diagnosis not present

## 2022-10-04 DIAGNOSIS — E213 Hyperparathyroidism, unspecified: Secondary | ICD-10-CM | POA: Diagnosis not present

## 2022-10-04 DIAGNOSIS — N186 End stage renal disease: Secondary | ICD-10-CM | POA: Diagnosis not present

## 2022-10-06 DIAGNOSIS — N186 End stage renal disease: Secondary | ICD-10-CM | POA: Diagnosis not present

## 2022-10-06 DIAGNOSIS — E213 Hyperparathyroidism, unspecified: Secondary | ICD-10-CM | POA: Diagnosis not present

## 2022-10-06 DIAGNOSIS — Z992 Dependence on renal dialysis: Secondary | ICD-10-CM | POA: Diagnosis not present

## 2022-10-06 DIAGNOSIS — N2581 Secondary hyperparathyroidism of renal origin: Secondary | ICD-10-CM | POA: Diagnosis not present

## 2022-10-07 DIAGNOSIS — Z4789 Encounter for other orthopedic aftercare: Secondary | ICD-10-CM | POA: Diagnosis not present

## 2022-10-07 DIAGNOSIS — M170 Bilateral primary osteoarthritis of knee: Secondary | ICD-10-CM | POA: Diagnosis not present

## 2022-10-08 DIAGNOSIS — N2581 Secondary hyperparathyroidism of renal origin: Secondary | ICD-10-CM | POA: Diagnosis not present

## 2022-10-08 DIAGNOSIS — N186 End stage renal disease: Secondary | ICD-10-CM | POA: Diagnosis not present

## 2022-10-08 DIAGNOSIS — Z992 Dependence on renal dialysis: Secondary | ICD-10-CM | POA: Diagnosis not present

## 2022-10-08 DIAGNOSIS — E213 Hyperparathyroidism, unspecified: Secondary | ICD-10-CM | POA: Diagnosis not present

## 2022-10-11 DIAGNOSIS — E213 Hyperparathyroidism, unspecified: Secondary | ICD-10-CM | POA: Diagnosis not present

## 2022-10-11 DIAGNOSIS — N2581 Secondary hyperparathyroidism of renal origin: Secondary | ICD-10-CM | POA: Diagnosis not present

## 2022-10-11 DIAGNOSIS — N186 End stage renal disease: Secondary | ICD-10-CM | POA: Diagnosis not present

## 2022-10-11 DIAGNOSIS — Z992 Dependence on renal dialysis: Secondary | ICD-10-CM | POA: Diagnosis not present

## 2022-10-13 DIAGNOSIS — N186 End stage renal disease: Secondary | ICD-10-CM | POA: Diagnosis not present

## 2022-10-13 DIAGNOSIS — N2581 Secondary hyperparathyroidism of renal origin: Secondary | ICD-10-CM | POA: Diagnosis not present

## 2022-10-13 DIAGNOSIS — E213 Hyperparathyroidism, unspecified: Secondary | ICD-10-CM | POA: Diagnosis not present

## 2022-10-13 DIAGNOSIS — Z992 Dependence on renal dialysis: Secondary | ICD-10-CM | POA: Diagnosis not present

## 2022-10-15 DIAGNOSIS — Z992 Dependence on renal dialysis: Secondary | ICD-10-CM | POA: Diagnosis not present

## 2022-10-15 DIAGNOSIS — E213 Hyperparathyroidism, unspecified: Secondary | ICD-10-CM | POA: Diagnosis not present

## 2022-10-15 DIAGNOSIS — N2581 Secondary hyperparathyroidism of renal origin: Secondary | ICD-10-CM | POA: Diagnosis not present

## 2022-10-15 DIAGNOSIS — N186 End stage renal disease: Secondary | ICD-10-CM | POA: Diagnosis not present

## 2022-10-18 DIAGNOSIS — E213 Hyperparathyroidism, unspecified: Secondary | ICD-10-CM | POA: Diagnosis not present

## 2022-10-18 DIAGNOSIS — N186 End stage renal disease: Secondary | ICD-10-CM | POA: Diagnosis not present

## 2022-10-18 DIAGNOSIS — N2581 Secondary hyperparathyroidism of renal origin: Secondary | ICD-10-CM | POA: Diagnosis not present

## 2022-10-18 DIAGNOSIS — Z992 Dependence on renal dialysis: Secondary | ICD-10-CM | POA: Diagnosis not present

## 2022-10-20 DIAGNOSIS — Z992 Dependence on renal dialysis: Secondary | ICD-10-CM | POA: Diagnosis not present

## 2022-10-20 DIAGNOSIS — E213 Hyperparathyroidism, unspecified: Secondary | ICD-10-CM | POA: Diagnosis not present

## 2022-10-20 DIAGNOSIS — N186 End stage renal disease: Secondary | ICD-10-CM | POA: Diagnosis not present

## 2022-10-20 DIAGNOSIS — N2581 Secondary hyperparathyroidism of renal origin: Secondary | ICD-10-CM | POA: Diagnosis not present

## 2022-10-21 DIAGNOSIS — Z992 Dependence on renal dialysis: Secondary | ICD-10-CM | POA: Diagnosis not present

## 2022-10-21 DIAGNOSIS — N2581 Secondary hyperparathyroidism of renal origin: Secondary | ICD-10-CM | POA: Diagnosis not present

## 2022-10-21 DIAGNOSIS — N186 End stage renal disease: Secondary | ICD-10-CM | POA: Diagnosis not present

## 2022-10-21 DIAGNOSIS — E213 Hyperparathyroidism, unspecified: Secondary | ICD-10-CM | POA: Diagnosis not present

## 2022-10-22 DIAGNOSIS — I120 Hypertensive chronic kidney disease with stage 5 chronic kidney disease or end stage renal disease: Secondary | ICD-10-CM | POA: Diagnosis not present

## 2022-10-22 DIAGNOSIS — N186 End stage renal disease: Secondary | ICD-10-CM | POA: Diagnosis not present

## 2022-10-22 DIAGNOSIS — Z992 Dependence on renal dialysis: Secondary | ICD-10-CM | POA: Diagnosis not present

## 2022-10-25 DIAGNOSIS — N186 End stage renal disease: Secondary | ICD-10-CM | POA: Diagnosis not present

## 2022-10-25 DIAGNOSIS — Z992 Dependence on renal dialysis: Secondary | ICD-10-CM | POA: Diagnosis not present

## 2022-10-27 DIAGNOSIS — Z992 Dependence on renal dialysis: Secondary | ICD-10-CM | POA: Diagnosis not present

## 2022-10-27 DIAGNOSIS — E213 Hyperparathyroidism, unspecified: Secondary | ICD-10-CM | POA: Diagnosis not present

## 2022-10-27 DIAGNOSIS — N2581 Secondary hyperparathyroidism of renal origin: Secondary | ICD-10-CM | POA: Diagnosis not present

## 2022-10-27 DIAGNOSIS — N186 End stage renal disease: Secondary | ICD-10-CM | POA: Diagnosis not present

## 2022-10-29 DIAGNOSIS — N2581 Secondary hyperparathyroidism of renal origin: Secondary | ICD-10-CM | POA: Diagnosis not present

## 2022-10-29 DIAGNOSIS — E213 Hyperparathyroidism, unspecified: Secondary | ICD-10-CM | POA: Diagnosis not present

## 2022-10-29 DIAGNOSIS — N186 End stage renal disease: Secondary | ICD-10-CM | POA: Diagnosis not present

## 2022-10-29 DIAGNOSIS — Z992 Dependence on renal dialysis: Secondary | ICD-10-CM | POA: Diagnosis not present

## 2022-11-01 DIAGNOSIS — E213 Hyperparathyroidism, unspecified: Secondary | ICD-10-CM | POA: Diagnosis not present

## 2022-11-01 DIAGNOSIS — N186 End stage renal disease: Secondary | ICD-10-CM | POA: Diagnosis not present

## 2022-11-01 DIAGNOSIS — Z992 Dependence on renal dialysis: Secondary | ICD-10-CM | POA: Diagnosis not present

## 2022-11-01 DIAGNOSIS — N2581 Secondary hyperparathyroidism of renal origin: Secondary | ICD-10-CM | POA: Diagnosis not present

## 2022-11-02 ENCOUNTER — Ambulatory Visit: Payer: Medicare HMO | Admitting: Nurse Practitioner

## 2022-11-02 DIAGNOSIS — M170 Bilateral primary osteoarthritis of knee: Secondary | ICD-10-CM | POA: Diagnosis not present

## 2022-11-02 DIAGNOSIS — Z4789 Encounter for other orthopedic aftercare: Secondary | ICD-10-CM | POA: Diagnosis not present

## 2022-11-03 DIAGNOSIS — N186 End stage renal disease: Secondary | ICD-10-CM | POA: Diagnosis not present

## 2022-11-03 DIAGNOSIS — N2581 Secondary hyperparathyroidism of renal origin: Secondary | ICD-10-CM | POA: Diagnosis not present

## 2022-11-03 DIAGNOSIS — Z992 Dependence on renal dialysis: Secondary | ICD-10-CM | POA: Diagnosis not present

## 2022-11-03 DIAGNOSIS — E213 Hyperparathyroidism, unspecified: Secondary | ICD-10-CM | POA: Diagnosis not present

## 2022-11-05 DIAGNOSIS — N186 End stage renal disease: Secondary | ICD-10-CM | POA: Diagnosis not present

## 2022-11-05 DIAGNOSIS — E213 Hyperparathyroidism, unspecified: Secondary | ICD-10-CM | POA: Diagnosis not present

## 2022-11-05 DIAGNOSIS — N2581 Secondary hyperparathyroidism of renal origin: Secondary | ICD-10-CM | POA: Diagnosis not present

## 2022-11-05 DIAGNOSIS — Z992 Dependence on renal dialysis: Secondary | ICD-10-CM | POA: Diagnosis not present

## 2022-11-08 ENCOUNTER — Ambulatory Visit: Payer: Medicare HMO | Admitting: Nurse Practitioner

## 2022-11-08 DIAGNOSIS — E213 Hyperparathyroidism, unspecified: Secondary | ICD-10-CM | POA: Diagnosis not present

## 2022-11-08 DIAGNOSIS — Z992 Dependence on renal dialysis: Secondary | ICD-10-CM | POA: Diagnosis not present

## 2022-11-08 DIAGNOSIS — N2581 Secondary hyperparathyroidism of renal origin: Secondary | ICD-10-CM | POA: Diagnosis not present

## 2022-11-08 DIAGNOSIS — N186 End stage renal disease: Secondary | ICD-10-CM | POA: Diagnosis not present

## 2022-11-08 NOTE — Progress Notes (Deleted)
[name],acting as a Neurosurgeon for Matthew Felts, FNP.,have documented all relevant documentation on the behalf of Matthew Felts, FNP,as directed by  Matthew Felts, FNP while in the presence of Matthew Felts, FNP.  Subjective:  Patient ID: [name] , male    DOB: [birth year] , 62 y.o.   MRN: [id number]  No chief complaint on file.   HPI  Patient presents today for bp, dm, and chol patient reports compliance with medications and has no other concerns today. Patient denies any chest pain, SOB, and headaches.     Past Medical History:  Diagnosis Date  . Acute lower GI bleeding 06/21/2018  . Acute respiratory failure with hypoxia (HCC) 01/13/2015  . Arthritis   . Atrial flutter (HCC)   . Bile leak, postoperative 03/15/2016  . Biliary dyskinesia 02/12/2016  . 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 Cardiomyopathy   . CHF (congestive heart failure) (HCC)   . Coronary artery disease    normal coronaries by 10/10/08 cath, Cardiac Cath 08-04-12 epic.Dr. Algie Coffer follows  . Diabetes mellitus    pt. states he's borderline diabetic., no longer taking med,- off med. since 2013  . Dialysis patient [facility])    Mon-Wed-Fri([facility])- Left AV fistula  . Diverticulitis 03/2015   reoccurred in December 2016  . Diverticulitis of intestine without perforation or abscess without bleeding 04/21/2015  . Dizziness 11/22/2018  . DVT (deep venous thrombosis) (HCC)   . ESRD (end stage renal disease) (HCC)   . GERD (gastroesophageal reflux disease)   . Gout   . Hemorrhage of left kidney 01/13/2018  . History of nephrectomy 07/04/2012   History of right nephrectomy in 2002 for renal cell carcinoma   . History of unilateral nephrectomy   . Hypertension   . Hypotension due to hypovolemia 06/04/2020  . Low iron   . Myocardial infarction [facility]) ?2006  . Renal cell carcinoma    dialysis- MWF- [address]- Dr. Briant Cedar follows.  . Renal insufficiency   . Shortness of  breath 05/19/2011   "at rest, lying down, w/exertion"  . Stroke [facility]) 02/2011   05/19/11 denies residual  . Umbilical hernia 05/19/2011   unrepaired  . Wears glasses      Family History  Problem Relation Age of Onset  . Hypertension Mother   . Kidney disease Mother      Current Outpatient Medications:  .  albuterol (VENTOLIN HFA) 108 (90 Base) MCG/ACT inhaler, Inhale 2 puffs into the lungs every 6 (six) hours as needed for wheezing or shortness of breath., Disp: 18 g, Rfl: 2 .  calcium acetate (PHOSLO) 667 MG capsule, Take 1-3 capsules (667-2,001 mg total) by mouth See admin instructions. Take 2001 mg capsules with meals three times daily and 667 mg capsule with snacks., Disp:  , Rfl:  .  chlorhexidine (PERIDEX) 0.12 % solution, 15 mLs by Mouth Rinse route 2 (two) times daily., Disp: , Rfl:  .  cinacalcet (SENSIPAR) 90 MG tablet, Take 180 mg by mouth daily., Disp: , Rfl:  .  Control Gel Formula Dressing (DUODERM CGF EXTRA THIN) MISC, Apply 1 each topically daily as needed., Disp: 10 each, Rfl: 2 .  diclofenac Sodium (VOLTAREN) 1 % GEL, Apply 2 g topically 4 (four) times daily. (Patient not taking: Reported on 09/10/2022), Disp: 100 g, Rfl: 2 .  digoxin (LANOXIN) 0.125 MG tablet, Take 1 tablet (0.125 mg total) by mouth every Monday, Wednesday, and Friday., Disp: 30  tablet, Rfl: 1 .  Etelcalcetide HCl (PARSABIV IV), Etelcalcetide (Parsabiv), Disp: , Rfl:  .  gabapentin (NEURONTIN) 300 MG capsule, Take 1 capsule (300 mg total) by mouth 3 (three) times daily. (Patient taking differently: Take 300 mg by mouth at bedtime.), Disp: 180 capsule, Rfl: 1 .  glucose blood test strip, Use to check blood sugar 3 times a day. Dx code e11.65, Disp: 100 each, Rfl: 12 .  HYDROcodone-acetaminophen (NORCO) 10-325 MG tablet, Take 0.5 tablets by mouth 2 (two) times daily as needed for moderate pain., Disp: , Rfl:  .  HYDROcodone-acetaminophen (NORCO) 7.5-325 MG tablet, Take 1 tablet by mouth every 6 (six) hours  as needed for moderate pain., Disp: 28 tablet, Rfl: 0 .  ipratropium (ATROVENT) 0.06 % nasal spray, Place 2 sprays into both nostrils 4 (four) times daily. (Patient not taking: Reported on 09/10/2022), Disp: 15 mL, Rfl: 12 .  isosorbide dinitrate (ISORDIL) 10 MG tablet, Take 10 mg by mouth 2 (two) times daily., Disp: , Rfl:  .  Lactobacillus (ACIDOPHILUS/BIFIDUS) WAFR, Take 1 Wafer by mouth daily. (Patient not taking: Reported on 09/10/2022), Disp: 90 Wafer, Rfl: 1 .  Lancets (ONETOUCH DELICA PLUS LANCET33G) MISC, Use to check blood sugar 3 times a day. Dx code e11.65, Disp: 100 each, Rfl: 3 .  lidocaine (LIDODERM) 5 %, Place 1 patch onto the skin daily. Remove & Discard patch within 12 hours or as directed by MD, Disp: 30 patch, Rfl: 0 .  loperamide (IMODIUM A-D) 2 MG tablet, Take 1 tablet (2 mg total) by mouth 4 (four) times daily as needed for diarrhea or loose stools. Also available OTC., Disp: 30 tablet, Rfl: 0 .  meclizine (ANTIVERT) 25 MG tablet, Take 1 tablet (25 mg total) by mouth 3 (three) times daily as needed for dizziness., Disp: 30 tablet, Rfl: 0 .  metoprolol succinate (TOPROL-XL) 25 MG 24 hr tablet, Take 1 tablet (25 mg total) by mouth daily., Disp: 30 tablet, Rfl: 3 .  metoprolol tartrate (LOPRESSOR) 25 MG tablet, Take 25 mg by mouth 2 (two) times daily., Disp: , Rfl:  .  midodrine (PROAMATINE) 10 MG tablet, Take 1 tablet (10 mg total) by mouth 3 (three) times daily., Disp: 90 tablet, Rfl: 3 .  multivitamin (RENA-VIT) TABS tablet, Take 1 tablet by mouth at bedtime., Disp: , Rfl:  .  nitroGLYCERIN (NITROSTAT) 0.4 MG SL tablet, Place 0.4 mg under the tongue every 5 (five) minutes as needed for chest pain. , Disp: , Rfl: 0 .  Nutritional Supplements (FEEDING SUPPLEMENT, NEPRO CARB STEADY,) LIQD, Take 237 mLs by mouth daily., Disp: , Rfl:  .  ondansetron (ZOFRAN ODT) 4 MG disintegrating tablet, Take 1 tablet (4 mg total) by mouth every 8 (eight) hours as needed for nausea or vomiting., Disp:  20 tablet, Rfl: 0 .  pantoprazole (PROTONIX) 40 MG tablet, Take 40 mg by mouth 2 (two) times daily., Disp: , Rfl:  .  rifaximin (XIFAXAN) 550 MG TABS tablet, Take 1 tablet (550 mg total) by mouth 3 (three) times daily., Disp: 42 tablet, Rfl: 0 .  sucroferric oxyhydroxide (VELPHORO) 500 MG chewable tablet, Chew 500-1,000 mg by mouth See admin instructions. Takes 2 tablets with each meal and 1 tablet with snacks., Disp: , Rfl:  .  UNABLE TO FIND, Out patient physical therapy- vestibular therapy for BPPV, Disp: 1 each, Rfl: 0 .  warfarin (COUMADIN) 7.5 MG tablet, Take 7.5 mg by mouth daily., Disp: , Rfl:    Allergies  Allergen Reactions  .  Ace Inhibitors Itching, Cough and Other (See Comments)     Review of Systems  Constitutional: Negative.   HENT: Negative.    Eyes: Negative.   Respiratory: Negative.    Cardiovascular: Negative.   Gastrointestinal: Negative.     There were no vitals filed for this visit. There is no height or weight on file to calculate BMI.  Wt Readings from Last 3 Encounters:  09/21/22 236 lb 5.3 oz (107.2 kg)  09/14/22 243 lb (110.2 kg)  09/11/22 245 lb (111.1 kg)    The ASCVD Risk score (Arnett DK, et al., 2019) failed to calculate for the following reasons:   The patient has a prior MI or stroke diagnosis  Objective:  Physical Exam      Assessment And Plan:  1. Mixed hyperlipidemia  2. Type 2 diabetes mellitus with end-stage renal disease (HCC)  3. Hypertension, benign    No follow-ups on file.  Patient was given opportunity to ask questions. Patient verbalized understanding of the plan and was able to repeat key elements of the plan. All questions were answered to their satisfaction.  Matthew Felts, FNP  I, Matthew Felts, FNP, have reviewed all documentation for this visit. The documentation on 11/08/22 for the exam, diagnosis, procedures, and orders are all accurate and complete.   IF YOU HAVE BEEN REFERRED TO A SPECIALIST, IT MAY TAKE 1-2 WEEKS TO  SCHEDULE/PROCESS THE REFERRAL. IF YOU HAVE NOT HEARD FROM US/SPECIALIST IN TWO WEEKS, PLEASE GIVE [place] A CALL AT [phone].

## 2022-11-09 DIAGNOSIS — M170 Bilateral primary osteoarthritis of knee: Secondary | ICD-10-CM | POA: Diagnosis not present

## 2022-11-10 DIAGNOSIS — Z992 Dependence on renal dialysis: Secondary | ICD-10-CM | POA: Diagnosis not present

## 2022-11-10 DIAGNOSIS — E213 Hyperparathyroidism, unspecified: Secondary | ICD-10-CM | POA: Diagnosis not present

## 2022-11-10 DIAGNOSIS — N186 End stage renal disease: Secondary | ICD-10-CM | POA: Diagnosis not present

## 2022-11-10 DIAGNOSIS — N2581 Secondary hyperparathyroidism of renal origin: Secondary | ICD-10-CM | POA: Diagnosis not present

## 2022-11-12 DIAGNOSIS — E213 Hyperparathyroidism, unspecified: Secondary | ICD-10-CM | POA: Diagnosis not present

## 2022-11-12 DIAGNOSIS — Z992 Dependence on renal dialysis: Secondary | ICD-10-CM | POA: Diagnosis not present

## 2022-11-12 DIAGNOSIS — N186 End stage renal disease: Secondary | ICD-10-CM | POA: Diagnosis not present

## 2022-11-12 DIAGNOSIS — N2581 Secondary hyperparathyroidism of renal origin: Secondary | ICD-10-CM | POA: Diagnosis not present

## 2022-11-15 DIAGNOSIS — Z992 Dependence on renal dialysis: Secondary | ICD-10-CM | POA: Diagnosis not present

## 2022-11-15 DIAGNOSIS — E213 Hyperparathyroidism, unspecified: Secondary | ICD-10-CM | POA: Diagnosis not present

## 2022-11-15 DIAGNOSIS — N186 End stage renal disease: Secondary | ICD-10-CM | POA: Diagnosis not present

## 2022-11-15 DIAGNOSIS — N2581 Secondary hyperparathyroidism of renal origin: Secondary | ICD-10-CM | POA: Diagnosis not present

## 2022-11-16 ENCOUNTER — Ambulatory Visit: Payer: Medicare HMO | Admitting: Podiatry

## 2022-11-16 DIAGNOSIS — M170 Bilateral primary osteoarthritis of knee: Secondary | ICD-10-CM | POA: Diagnosis not present

## 2022-11-17 DIAGNOSIS — N186 End stage renal disease: Secondary | ICD-10-CM | POA: Diagnosis not present

## 2022-11-17 DIAGNOSIS — N2581 Secondary hyperparathyroidism of renal origin: Secondary | ICD-10-CM | POA: Diagnosis not present

## 2022-11-17 DIAGNOSIS — E213 Hyperparathyroidism, unspecified: Secondary | ICD-10-CM | POA: Diagnosis not present

## 2022-11-17 DIAGNOSIS — Z992 Dependence on renal dialysis: Secondary | ICD-10-CM | POA: Diagnosis not present

## 2022-11-19 DIAGNOSIS — E213 Hyperparathyroidism, unspecified: Secondary | ICD-10-CM | POA: Diagnosis not present

## 2022-11-19 DIAGNOSIS — Z992 Dependence on renal dialysis: Secondary | ICD-10-CM | POA: Diagnosis not present

## 2022-11-19 DIAGNOSIS — N2581 Secondary hyperparathyroidism of renal origin: Secondary | ICD-10-CM | POA: Diagnosis not present

## 2022-11-19 DIAGNOSIS — N186 End stage renal disease: Secondary | ICD-10-CM | POA: Diagnosis not present

## 2022-11-21 DIAGNOSIS — N186 End stage renal disease: Secondary | ICD-10-CM | POA: Diagnosis not present

## 2022-11-21 DIAGNOSIS — I120 Hypertensive chronic kidney disease with stage 5 chronic kidney disease or end stage renal disease: Secondary | ICD-10-CM | POA: Diagnosis not present

## 2022-11-21 DIAGNOSIS — Z992 Dependence on renal dialysis: Secondary | ICD-10-CM | POA: Diagnosis not present

## 2022-11-22 DIAGNOSIS — E213 Hyperparathyroidism, unspecified: Secondary | ICD-10-CM | POA: Diagnosis not present

## 2022-11-22 DIAGNOSIS — N186 End stage renal disease: Secondary | ICD-10-CM | POA: Diagnosis not present

## 2022-11-22 DIAGNOSIS — Z992 Dependence on renal dialysis: Secondary | ICD-10-CM | POA: Diagnosis not present

## 2022-11-22 DIAGNOSIS — N2581 Secondary hyperparathyroidism of renal origin: Secondary | ICD-10-CM | POA: Diagnosis not present

## 2022-11-24 DIAGNOSIS — E213 Hyperparathyroidism, unspecified: Secondary | ICD-10-CM | POA: Diagnosis not present

## 2022-11-24 DIAGNOSIS — N2581 Secondary hyperparathyroidism of renal origin: Secondary | ICD-10-CM | POA: Diagnosis not present

## 2022-11-24 DIAGNOSIS — Z992 Dependence on renal dialysis: Secondary | ICD-10-CM | POA: Diagnosis not present

## 2022-11-24 DIAGNOSIS — N186 End stage renal disease: Secondary | ICD-10-CM | POA: Diagnosis not present

## 2022-11-25 DIAGNOSIS — N186 End stage renal disease: Secondary | ICD-10-CM | POA: Diagnosis not present

## 2022-11-25 DIAGNOSIS — N2581 Secondary hyperparathyroidism of renal origin: Secondary | ICD-10-CM | POA: Diagnosis not present

## 2022-11-25 DIAGNOSIS — Z992 Dependence on renal dialysis: Secondary | ICD-10-CM | POA: Diagnosis not present

## 2022-11-25 DIAGNOSIS — E213 Hyperparathyroidism, unspecified: Secondary | ICD-10-CM | POA: Diagnosis not present

## 2022-11-29 DIAGNOSIS — Z992 Dependence on renal dialysis: Secondary | ICD-10-CM | POA: Diagnosis not present

## 2022-11-29 DIAGNOSIS — E213 Hyperparathyroidism, unspecified: Secondary | ICD-10-CM | POA: Diagnosis not present

## 2022-11-29 DIAGNOSIS — N2581 Secondary hyperparathyroidism of renal origin: Secondary | ICD-10-CM | POA: Diagnosis not present

## 2022-11-29 DIAGNOSIS — N186 End stage renal disease: Secondary | ICD-10-CM | POA: Diagnosis not present

## 2022-12-01 DIAGNOSIS — N2581 Secondary hyperparathyroidism of renal origin: Secondary | ICD-10-CM | POA: Diagnosis not present

## 2022-12-01 DIAGNOSIS — E213 Hyperparathyroidism, unspecified: Secondary | ICD-10-CM | POA: Diagnosis not present

## 2022-12-01 DIAGNOSIS — N186 End stage renal disease: Secondary | ICD-10-CM | POA: Diagnosis not present

## 2022-12-01 DIAGNOSIS — Z992 Dependence on renal dialysis: Secondary | ICD-10-CM | POA: Diagnosis not present

## 2022-12-03 DIAGNOSIS — E213 Hyperparathyroidism, unspecified: Secondary | ICD-10-CM | POA: Diagnosis not present

## 2022-12-03 DIAGNOSIS — Z992 Dependence on renal dialysis: Secondary | ICD-10-CM | POA: Diagnosis not present

## 2022-12-03 DIAGNOSIS — N186 End stage renal disease: Secondary | ICD-10-CM | POA: Diagnosis not present

## 2022-12-03 DIAGNOSIS — N2581 Secondary hyperparathyroidism of renal origin: Secondary | ICD-10-CM | POA: Diagnosis not present

## 2022-12-06 DIAGNOSIS — E213 Hyperparathyroidism, unspecified: Secondary | ICD-10-CM | POA: Diagnosis not present

## 2022-12-06 DIAGNOSIS — N186 End stage renal disease: Secondary | ICD-10-CM | POA: Diagnosis not present

## 2022-12-06 DIAGNOSIS — Z992 Dependence on renal dialysis: Secondary | ICD-10-CM | POA: Diagnosis not present

## 2022-12-06 DIAGNOSIS — N2581 Secondary hyperparathyroidism of renal origin: Secondary | ICD-10-CM | POA: Diagnosis not present

## 2022-12-07 DIAGNOSIS — M19012 Primary osteoarthritis, left shoulder: Secondary | ICD-10-CM | POA: Diagnosis not present

## 2022-12-07 DIAGNOSIS — R072 Precordial pain: Secondary | ICD-10-CM | POA: Diagnosis not present

## 2022-12-07 DIAGNOSIS — I2510 Atherosclerotic heart disease of native coronary artery without angina pectoris: Secondary | ICD-10-CM | POA: Diagnosis not present

## 2022-12-07 DIAGNOSIS — N186 End stage renal disease: Secondary | ICD-10-CM | POA: Diagnosis not present

## 2022-12-07 DIAGNOSIS — I5022 Chronic systolic (congestive) heart failure: Secondary | ICD-10-CM | POA: Diagnosis not present

## 2022-12-07 DIAGNOSIS — Z4789 Encounter for other orthopedic aftercare: Secondary | ICD-10-CM | POA: Diagnosis not present

## 2022-12-08 ENCOUNTER — Other Ambulatory Visit: Payer: Self-pay | Admitting: Orthopedic Surgery

## 2022-12-08 DIAGNOSIS — E213 Hyperparathyroidism, unspecified: Secondary | ICD-10-CM | POA: Diagnosis not present

## 2022-12-08 DIAGNOSIS — Z4789 Encounter for other orthopedic aftercare: Secondary | ICD-10-CM

## 2022-12-08 DIAGNOSIS — Z992 Dependence on renal dialysis: Secondary | ICD-10-CM | POA: Diagnosis not present

## 2022-12-08 DIAGNOSIS — N186 End stage renal disease: Secondary | ICD-10-CM | POA: Diagnosis not present

## 2022-12-08 DIAGNOSIS — N2581 Secondary hyperparathyroidism of renal origin: Secondary | ICD-10-CM | POA: Diagnosis not present

## 2022-12-10 DIAGNOSIS — E213 Hyperparathyroidism, unspecified: Secondary | ICD-10-CM | POA: Diagnosis not present

## 2022-12-10 DIAGNOSIS — Z992 Dependence on renal dialysis: Secondary | ICD-10-CM | POA: Diagnosis not present

## 2022-12-10 DIAGNOSIS — N186 End stage renal disease: Secondary | ICD-10-CM | POA: Diagnosis not present

## 2022-12-10 DIAGNOSIS — N2581 Secondary hyperparathyroidism of renal origin: Secondary | ICD-10-CM | POA: Diagnosis not present

## 2022-12-13 DIAGNOSIS — N186 End stage renal disease: Secondary | ICD-10-CM | POA: Diagnosis not present

## 2022-12-13 DIAGNOSIS — E213 Hyperparathyroidism, unspecified: Secondary | ICD-10-CM | POA: Diagnosis not present

## 2022-12-13 DIAGNOSIS — Z992 Dependence on renal dialysis: Secondary | ICD-10-CM | POA: Diagnosis not present

## 2022-12-13 DIAGNOSIS — N2581 Secondary hyperparathyroidism of renal origin: Secondary | ICD-10-CM | POA: Diagnosis not present

## 2022-12-14 DIAGNOSIS — R072 Precordial pain: Secondary | ICD-10-CM | POA: Diagnosis not present

## 2022-12-14 DIAGNOSIS — I5022 Chronic systolic (congestive) heart failure: Secondary | ICD-10-CM | POA: Diagnosis not present

## 2022-12-14 DIAGNOSIS — N186 End stage renal disease: Secondary | ICD-10-CM | POA: Diagnosis not present

## 2022-12-14 DIAGNOSIS — I2510 Atherosclerotic heart disease of native coronary artery without angina pectoris: Secondary | ICD-10-CM | POA: Diagnosis not present

## 2022-12-15 DIAGNOSIS — N186 End stage renal disease: Secondary | ICD-10-CM | POA: Diagnosis not present

## 2022-12-15 DIAGNOSIS — Z992 Dependence on renal dialysis: Secondary | ICD-10-CM | POA: Diagnosis not present

## 2022-12-15 DIAGNOSIS — N2581 Secondary hyperparathyroidism of renal origin: Secondary | ICD-10-CM | POA: Diagnosis not present

## 2022-12-15 DIAGNOSIS — E213 Hyperparathyroidism, unspecified: Secondary | ICD-10-CM | POA: Diagnosis not present

## 2022-12-17 DIAGNOSIS — Z992 Dependence on renal dialysis: Secondary | ICD-10-CM | POA: Diagnosis not present

## 2022-12-17 DIAGNOSIS — E213 Hyperparathyroidism, unspecified: Secondary | ICD-10-CM | POA: Diagnosis not present

## 2022-12-17 DIAGNOSIS — N186 End stage renal disease: Secondary | ICD-10-CM | POA: Diagnosis not present

## 2022-12-17 DIAGNOSIS — N2581 Secondary hyperparathyroidism of renal origin: Secondary | ICD-10-CM | POA: Diagnosis not present

## 2022-12-20 DIAGNOSIS — Z992 Dependence on renal dialysis: Secondary | ICD-10-CM | POA: Diagnosis not present

## 2022-12-20 DIAGNOSIS — N2581 Secondary hyperparathyroidism of renal origin: Secondary | ICD-10-CM | POA: Diagnosis not present

## 2022-12-20 DIAGNOSIS — E213 Hyperparathyroidism, unspecified: Secondary | ICD-10-CM | POA: Diagnosis not present

## 2022-12-20 DIAGNOSIS — N186 End stage renal disease: Secondary | ICD-10-CM | POA: Diagnosis not present

## 2022-12-21 ENCOUNTER — Other Ambulatory Visit: Payer: Self-pay | Admitting: Nurse Practitioner

## 2022-12-22 DIAGNOSIS — Z992 Dependence on renal dialysis: Secondary | ICD-10-CM | POA: Diagnosis not present

## 2022-12-22 DIAGNOSIS — N186 End stage renal disease: Secondary | ICD-10-CM | POA: Diagnosis not present

## 2022-12-22 DIAGNOSIS — E213 Hyperparathyroidism, unspecified: Secondary | ICD-10-CM | POA: Diagnosis not present

## 2022-12-22 DIAGNOSIS — I120 Hypertensive chronic kidney disease with stage 5 chronic kidney disease or end stage renal disease: Secondary | ICD-10-CM | POA: Diagnosis not present

## 2022-12-22 DIAGNOSIS — N2581 Secondary hyperparathyroidism of renal origin: Secondary | ICD-10-CM | POA: Diagnosis not present

## 2022-12-24 DIAGNOSIS — N2581 Secondary hyperparathyroidism of renal origin: Secondary | ICD-10-CM | POA: Diagnosis not present

## 2022-12-24 DIAGNOSIS — N186 End stage renal disease: Secondary | ICD-10-CM | POA: Diagnosis not present

## 2022-12-24 DIAGNOSIS — E213 Hyperparathyroidism, unspecified: Secondary | ICD-10-CM | POA: Diagnosis not present

## 2022-12-24 DIAGNOSIS — Z992 Dependence on renal dialysis: Secondary | ICD-10-CM | POA: Diagnosis not present

## 2022-12-27 DIAGNOSIS — N2581 Secondary hyperparathyroidism of renal origin: Secondary | ICD-10-CM | POA: Diagnosis not present

## 2022-12-27 DIAGNOSIS — N186 End stage renal disease: Secondary | ICD-10-CM | POA: Diagnosis not present

## 2022-12-27 DIAGNOSIS — E213 Hyperparathyroidism, unspecified: Secondary | ICD-10-CM | POA: Diagnosis not present

## 2022-12-27 DIAGNOSIS — Z992 Dependence on renal dialysis: Secondary | ICD-10-CM | POA: Diagnosis not present

## 2022-12-28 DIAGNOSIS — R072 Precordial pain: Secondary | ICD-10-CM | POA: Diagnosis not present

## 2022-12-28 DIAGNOSIS — N186 End stage renal disease: Secondary | ICD-10-CM | POA: Diagnosis not present

## 2022-12-28 DIAGNOSIS — I2510 Atherosclerotic heart disease of native coronary artery without angina pectoris: Secondary | ICD-10-CM | POA: Diagnosis not present

## 2022-12-28 DIAGNOSIS — I5022 Chronic systolic (congestive) heart failure: Secondary | ICD-10-CM | POA: Diagnosis not present

## 2022-12-29 DIAGNOSIS — E213 Hyperparathyroidism, unspecified: Secondary | ICD-10-CM | POA: Diagnosis not present

## 2022-12-29 DIAGNOSIS — Z992 Dependence on renal dialysis: Secondary | ICD-10-CM | POA: Diagnosis not present

## 2022-12-29 DIAGNOSIS — N2581 Secondary hyperparathyroidism of renal origin: Secondary | ICD-10-CM | POA: Diagnosis not present

## 2022-12-29 DIAGNOSIS — N186 End stage renal disease: Secondary | ICD-10-CM | POA: Diagnosis not present

## 2022-12-30 ENCOUNTER — Ambulatory Visit
Admission: RE | Admit: 2022-12-30 | Discharge: 2022-12-30 | Disposition: A | Discharge status: Home / Self Care | Payer: Medicare HMO | Source: Ambulatory Visit | Attending: Orthopedic Surgery | Admitting: Orthopedic Surgery

## 2022-12-30 DIAGNOSIS — M19012 Primary osteoarthritis, left shoulder: Secondary | ICD-10-CM | POA: Diagnosis not present

## 2022-12-30 DIAGNOSIS — Z4789 Encounter for other orthopedic aftercare: Secondary | ICD-10-CM

## 2022-12-30 DIAGNOSIS — M25512 Pain in left shoulder: Secondary | ICD-10-CM | POA: Diagnosis not present

## 2022-12-31 DIAGNOSIS — N186 End stage renal disease: Secondary | ICD-10-CM | POA: Diagnosis not present

## 2022-12-31 DIAGNOSIS — N2581 Secondary hyperparathyroidism of renal origin: Secondary | ICD-10-CM | POA: Diagnosis not present

## 2022-12-31 DIAGNOSIS — Z992 Dependence on renal dialysis: Secondary | ICD-10-CM | POA: Diagnosis not present

## 2022-12-31 DIAGNOSIS — E213 Hyperparathyroidism, unspecified: Secondary | ICD-10-CM | POA: Diagnosis not present

## 2023-01-03 DIAGNOSIS — N2581 Secondary hyperparathyroidism of renal origin: Secondary | ICD-10-CM | POA: Diagnosis not present

## 2023-01-03 DIAGNOSIS — Z992 Dependence on renal dialysis: Secondary | ICD-10-CM | POA: Diagnosis not present

## 2023-01-03 DIAGNOSIS — E213 Hyperparathyroidism, unspecified: Secondary | ICD-10-CM | POA: Diagnosis not present

## 2023-01-03 DIAGNOSIS — N186 End stage renal disease: Secondary | ICD-10-CM | POA: Diagnosis not present

## 2023-01-04 ENCOUNTER — Ambulatory Visit (INDEPENDENT_AMBULATORY_CARE_PROVIDER_SITE_OTHER): Payer: Medicare HMO | Admitting: Podiatry

## 2023-01-04 ENCOUNTER — Encounter: Payer: Self-pay | Admitting: Podiatry

## 2023-01-04 DIAGNOSIS — B351 Tinea unguium: Secondary | ICD-10-CM

## 2023-01-04 DIAGNOSIS — M79675 Pain in left toe(s): Secondary | ICD-10-CM

## 2023-01-04 DIAGNOSIS — M79674 Pain in right toe(s): Secondary | ICD-10-CM | POA: Diagnosis not present

## 2023-01-04 DIAGNOSIS — E119 Type 2 diabetes mellitus without complications: Secondary | ICD-10-CM

## 2023-01-04 NOTE — Progress Notes (Signed)
This patient returns to my office for at risk foot care.  This patient requires this care by a professional since this patient will be at risk due to having DM, chronic anticoagulation and ESRD.  This patient is unable to cut nails himself since the patient cannot reach his nails.These nails are painful walking and wearing shoes.  This patient presents for at risk foot care today.  General Appearance  Alert, conversant and in no acute stress.  Vascular  Dorsalis pedis and posterior tibial  pulses are palpable  bilaterally.  Capillary return is within normal limits  bilaterally. Temperature is within normal limits  bilaterally.  Neurologic  Senn-Weinstein monofilament wire test within normal limits  bilaterally. Muscle power within normal limits bilaterally.  Nails Thick disfigured discolored nails with subungual debris  from hallux to fifth toes bilaterally. No evidence of bacterial infection or drainage bilaterally.  Orthopedic  No limitations of motion  feet .  No crepitus or effusions noted.  No bony pathology or digital deformities noted.  Skin  normotropic skin with no porokeratosis noted bilaterally.  No signs of infections or ulcers noted.     Onychomycosis  Pain in right toes  Pain in left toes  Consent was obtained for treatment procedures.   Mechanical debridement of nails 1-5  bilaterally performed with a nail nipper.  Filed with dremel without incident.    Return office visit     3 months                 Told patient to return for periodic foot care and evaluation due to potential at risk complications.   Gardiner Barefoot DPM

## 2023-01-05 DIAGNOSIS — N186 End stage renal disease: Secondary | ICD-10-CM | POA: Diagnosis not present

## 2023-01-05 DIAGNOSIS — E213 Hyperparathyroidism, unspecified: Secondary | ICD-10-CM | POA: Diagnosis not present

## 2023-01-05 DIAGNOSIS — N2581 Secondary hyperparathyroidism of renal origin: Secondary | ICD-10-CM | POA: Diagnosis not present

## 2023-01-05 DIAGNOSIS — Z992 Dependence on renal dialysis: Secondary | ICD-10-CM | POA: Diagnosis not present

## 2023-01-07 DIAGNOSIS — E213 Hyperparathyroidism, unspecified: Secondary | ICD-10-CM | POA: Diagnosis not present

## 2023-01-07 DIAGNOSIS — N186 End stage renal disease: Secondary | ICD-10-CM | POA: Diagnosis not present

## 2023-01-07 DIAGNOSIS — N2581 Secondary hyperparathyroidism of renal origin: Secondary | ICD-10-CM | POA: Diagnosis not present

## 2023-01-07 DIAGNOSIS — Z992 Dependence on renal dialysis: Secondary | ICD-10-CM | POA: Diagnosis not present

## 2023-01-10 DIAGNOSIS — Z992 Dependence on renal dialysis: Secondary | ICD-10-CM | POA: Diagnosis not present

## 2023-01-10 DIAGNOSIS — N186 End stage renal disease: Secondary | ICD-10-CM | POA: Diagnosis not present

## 2023-01-10 DIAGNOSIS — N2581 Secondary hyperparathyroidism of renal origin: Secondary | ICD-10-CM | POA: Diagnosis not present

## 2023-01-10 DIAGNOSIS — E213 Hyperparathyroidism, unspecified: Secondary | ICD-10-CM | POA: Diagnosis not present

## 2023-01-12 DIAGNOSIS — Z992 Dependence on renal dialysis: Secondary | ICD-10-CM | POA: Diagnosis not present

## 2023-01-12 DIAGNOSIS — N186 End stage renal disease: Secondary | ICD-10-CM | POA: Diagnosis not present

## 2023-01-12 DIAGNOSIS — E213 Hyperparathyroidism, unspecified: Secondary | ICD-10-CM | POA: Diagnosis not present

## 2023-01-12 DIAGNOSIS — N2581 Secondary hyperparathyroidism of renal origin: Secondary | ICD-10-CM | POA: Diagnosis not present

## 2023-01-14 DIAGNOSIS — N186 End stage renal disease: Secondary | ICD-10-CM | POA: Diagnosis not present

## 2023-01-14 DIAGNOSIS — Z992 Dependence on renal dialysis: Secondary | ICD-10-CM | POA: Diagnosis not present

## 2023-01-14 DIAGNOSIS — N2581 Secondary hyperparathyroidism of renal origin: Secondary | ICD-10-CM | POA: Diagnosis not present

## 2023-01-14 DIAGNOSIS — E213 Hyperparathyroidism, unspecified: Secondary | ICD-10-CM | POA: Diagnosis not present

## 2023-01-17 DIAGNOSIS — E213 Hyperparathyroidism, unspecified: Secondary | ICD-10-CM | POA: Diagnosis not present

## 2023-01-17 DIAGNOSIS — Z992 Dependence on renal dialysis: Secondary | ICD-10-CM | POA: Diagnosis not present

## 2023-01-17 DIAGNOSIS — N186 End stage renal disease: Secondary | ICD-10-CM | POA: Diagnosis not present

## 2023-01-17 DIAGNOSIS — N2581 Secondary hyperparathyroidism of renal origin: Secondary | ICD-10-CM | POA: Diagnosis not present

## 2023-01-19 ENCOUNTER — Ambulatory Visit: Payer: Medicare HMO

## 2023-01-19 VITALS — BP 110/60 | HR 93 | Temp 97.4°F | Ht 68.0 in | Wt 232.6 lb

## 2023-01-19 DIAGNOSIS — Z0000 Encounter for general adult medical examination without abnormal findings: Secondary | ICD-10-CM | POA: Diagnosis not present

## 2023-01-19 DIAGNOSIS — N2581 Secondary hyperparathyroidism of renal origin: Secondary | ICD-10-CM | POA: Diagnosis not present

## 2023-01-19 DIAGNOSIS — E119 Type 2 diabetes mellitus without complications: Secondary | ICD-10-CM | POA: Diagnosis not present

## 2023-01-19 DIAGNOSIS — Z992 Dependence on renal dialysis: Secondary | ICD-10-CM | POA: Diagnosis not present

## 2023-01-19 DIAGNOSIS — N186 End stage renal disease: Secondary | ICD-10-CM | POA: Diagnosis not present

## 2023-01-19 DIAGNOSIS — E213 Hyperparathyroidism, unspecified: Secondary | ICD-10-CM | POA: Diagnosis not present

## 2023-01-19 DIAGNOSIS — I5040 Unspecified combined systolic (congestive) and diastolic (congestive) heart failure: Secondary | ICD-10-CM | POA: Diagnosis not present

## 2023-01-19 NOTE — Progress Notes (Signed)
Subjective:   [name] is a 62 y.o. male who presents for Medicare Annual/Subsequent preventive examination.  Visit Complete: In person    Review of Systems     Cardiac Risk Factors include: advanced age (>23men, >69 women);diabetes mellitus;hypertension;male gender     Objective:    Today's Vitals   01/19/23 1104  BP: 110/60  Pulse: 93  Temp: (!) 97.4 F (36.3 C)  TempSrc: Oral  SpO2: 99%  Weight: 232 lb 9.6 oz (105.5 kg)  Height: 5\' 8"  (1.727 m)   Body mass index is 35.37 kg/m.     01/19/2023   11:15 AM 09/20/2022    4:00 PM 09/14/2022   11:23 AM 09/11/2022    5:02 PM 01/07/2022   11:21 AM 12/25/2020    9:29 AM 06/05/2020    8:33 AM  Advanced Directives  Does Patient Have a Medical Advance Directive? No No No No No No No  Would patient like information on creating a medical advance directive?  No - Patient declined No - Patient declined  No - Patient declined No - Patient declined No - Patient declined    Current Medications (verified) Outpatient Encounter Medications as of 01/19/2023  Medication Sig   albuterol (VENTOLIN HFA) 108 (90 Base) MCG/ACT inhaler Inhale 2 puffs into the lungs every 6 (six) hours as needed for wheezing or shortness of breath.   calcium acetate (PHOSLO) 667 MG capsule Take 1-3 capsules (667-2,001 mg total) by mouth See admin instructions. Take 2001 mg capsules with meals three times daily and 667 mg capsule with snacks.   chlorhexidine (PERIDEX) 0.12 % solution 15 mLs by Mouth Rinse route 2 (two) times daily.   cinacalcet (SENSIPAR) 90 MG tablet Take 180 mg by mouth daily.   Control Gel Formula Dressing (DUODERM CGF EXTRA THIN) MISC Apply 1 each topically daily as needed.   diclofenac Sodium (VOLTAREN) 1 % GEL Apply 2 g topically 4 (four) times daily.   digoxin (LANOXIN) 0.125 MG tablet Take 1 tablet (0.125 mg total) by mouth every Monday, Wednesday, and Friday.   gabapentin (NEURONTIN) 300 MG capsule Take 1 capsule (300 mg total) by  mouth at bedtime.   glucose blood test strip Use to check blood sugar 3 times a day. Dx code e11.65   HYDROcodone-acetaminophen (NORCO) 10-325 MG tablet Take 0.5 tablets by mouth 2 (two) times daily as needed for moderate pain.   ipratropium (ATROVENT) 0.06 % nasal spray Place 2 sprays into both nostrils 4 (four) times daily.   isosorbide dinitrate (ISORDIL) 10 MG tablet Take 10 mg by mouth 2 (two) times daily.   Lactobacillus (ACIDOPHILUS/BIFIDUS) WAFR Take 1 Wafer by mouth daily.   Lancets (ONETOUCH DELICA PLUS LANCET33G) MISC Use to check blood sugar 3 times a day. Dx code e11.65   lidocaine (LIDODERM) 5 % Place 1 patch onto the skin daily. Remove & Discard patch within 12 hours or as directed by MD   loperamide (IMODIUM A-D) 2 MG tablet Take 1 tablet (2 mg total) by mouth 4 (four) times daily as needed for diarrhea or loose stools. Also available OTC.   meclizine (ANTIVERT) 25 MG tablet Take 1 tablet (25 mg total) by mouth 3 (three) times daily as needed for dizziness.   metoprolol succinate (TOPROL-XL) 25 MG 24 hr tablet Take 1 tablet (25 mg total) by mouth daily.   midodrine (PROAMATINE) 10 MG tablet Take 1 tablet (10 mg total) by mouth 3 (three) times daily.   multivitamin (RENA-VIT) TABS tablet  Take 1 tablet by mouth at bedtime.   nitroGLYCERIN (NITROSTAT) 0.4 MG SL tablet Place 0.4 mg under the tongue every 5 (five) minutes as needed for chest pain.    Nutritional Supplements (FEEDING SUPPLEMENT, NEPRO CARB STEADY,) LIQD Take 237 mLs by mouth daily.   ondansetron (ZOFRAN ODT) 4 MG disintegrating tablet Take 1 tablet (4 mg total) by mouth every 8 (eight) hours as needed for nausea or vomiting.   pantoprazole (PROTONIX) 40 MG tablet Take 40 mg by mouth 2 (two) times daily.   rifaximin (XIFAXAN) 550 MG TABS tablet Take 1 tablet (550 mg total) by mouth 3 (three) times daily.   sucroferric oxyhydroxide (VELPHORO) 500 MG chewable tablet Chew 500-1,000 mg by mouth See admin instructions. Takes 2  tablets with each meal and 1 tablet with snacks.   UNABLE TO FIND Out patient physical therapy- vestibular therapy for BPPV   warfarin (COUMADIN) 7.5 MG tablet Take 7.5 mg by mouth daily.   Etelcalcetide HCl (PARSABIV IV) Etelcalcetide Brigitte Pulse) (Patient not taking: Reported on 01/19/2023)   HYDROcodone-acetaminophen (NORCO) 7.5-325 MG tablet Take 1 tablet by mouth every 6 (six) hours as needed for moderate pain. (Patient not taking: Reported on 01/19/2023)   metoprolol tartrate (LOPRESSOR) 25 MG tablet Take 25 mg by mouth 2 (two) times daily. (Patient not taking: Reported on 01/19/2023)   No facility-administered encounter medications on file as of 01/19/2023.    Allergies (verified) Ace inhibitors   History: Past Medical History:  Diagnosis Date   Acute lower GI bleeding 06/21/2018   Acute respiratory failure with hypoxia (HCC) 01/13/2015   Arthritis    Atrial flutter (HCC)    Bile leak, postoperative 03/15/2016   Biliary dyskinesia 02/12/2016   Blind right eye    Hx: of partial blindness in right eye   Cardiomyopathy    CHF (congestive heart failure) (HCC)    Coronary artery disease    normal coronaries by 10/10/08 cath, Cardiac Cath 08-04-12 epic.Dr. Algie Coffer follows   Diabetes mellitus    pt. states he's borderline diabetic., no longer taking med,- off med. since 2013   Dialysis patient [facility])    Mon-Wed-Fri([facility])- Left AV fistula   Diverticulitis 03/2015   reoccurred in December 2016   Diverticulitis of intestine without perforation or abscess without bleeding 04/21/2015   Dizziness 11/22/2018   DVT (deep venous thrombosis) (HCC)    ESRD (end stage renal disease) (HCC)    GERD (gastroesophageal reflux disease)    Gout    Hemorrhage of left kidney 01/13/2018   History of nephrectomy 07/04/2012   History of right nephrectomy in 2002 for renal cell carcinoma    History of unilateral nephrectomy    Hypertension    Hypotension due to hypovolemia 06/04/2020    Low iron    Myocardial infarction [facility]) ?2006   Renal cell carcinoma    dialysis- MWF- [address]- Dr. Briant Cedar follows.   Renal insufficiency    Shortness of breath 05/19/2011   "at rest, lying down, w/exertion"   Stroke [facility]) 02/2011   05/19/11 denies residual   Umbilical hernia 05/19/2011   unrepaired   Wears glasses    Past Surgical History:  Procedure Laterality Date   ANAL FISTULOTOMY  08/15/2018   AV FISTULA PLACEMENT  03/29/2011   Procedure: ARTERIOVENOUS (AV) FISTULA CREATION;  Surgeon: Nilda Simmer, MD;  Location: [facility] OR;  Service: Vascular;  Laterality: Left;  LEFT RADIOCEPHALIC , Arteriovenous [phone])   CARDIAC CATHETERIZATION  05/21/11   CARDIAC CATHETERIZATION  N/A 03/16/2016   Procedure: Left Heart Cath and Coronary Angiography;  Surgeon: Orpah Cobb, MD;  Location: [facility] INVASIVE CV LAB;  Service: Cardiovascular;  Laterality: N/A;   CHOLECYSTECTOMY N/A 02/12/2016   Procedure: LAPAROSCOPIC CHOLECYSTECTOMY WITH INTRAOPERATIVE CHOLANGIOGRAM;  Surgeon: Avel Peace, MD;  Location: [facility] OR;  Service: General;  Laterality: N/A;   COLONOSCOPY WITH PROPOFOL N/A 02/01/2017   Procedure: COLONOSCOPY WITH PROPOFOL;  Surgeon: Charna Elizabeth, MD;  Location: [facility] ENDOSCOPY;  Service: Endoscopy;  Laterality: N/A;   dialysis cath placed     ESOPHAGOGASTRODUODENOSCOPY (EGD) WITH PROPOFOL N/A 12/02/2015   Procedure: ESOPHAGOGASTRODUODENOSCOPY (EGD) WITH PROPOFOL;  Surgeon: Charna Elizabeth, MD;  Location: [facility] ENDOSCOPY;  Service: Endoscopy;  Laterality: N/A;   EVALUATION UNDER ANESTHESIA WITH HEMORRHOIDECTOMY N/A 08/15/2018   Procedure: HEMORRHOID LIGATION/PEXY;  Surgeon: Karie Soda, MD;  Location: [facility] OR;  Service: General;  Laterality: N/A;   FINGER SURGERY     L pinkie finger- ORIF- /w remaining hardware - 1990's     FISTULOTOMY N/A 08/15/2018   Procedure: SUPERFICIAL ANAL FISTULOTOMY;  Surgeon: Karie Soda, MD;  Location: [facility] OR;  Service: General;  Laterality: N/A;   HEMORRHOID  SURGERY     HERNIA REPAIR N/A 07/04/2012   Umbilical hernia repair   INSERTION OF DIALYSIS CATHETER N/A 01/27/2016   Procedure: INSERTION OF DIALYSIS CATHETER;  Surgeon: Maeola Harman, MD;  Location: [facility] OR;  Service: Vascular;  Laterality: N/A;   INSERTION OF MESH N/A 07/04/2012   Procedure: INSERTION OF MESH;  Surgeon: Lodema Pilot, DO;  Location: [facility] OR;  Service: General;  Laterality: N/A;   IR EMBO ART  VEN HEMORR LYMPH EXTRAV  INC GUIDE ROADMAPPING  01/13/2018   IR FLUORO GUIDE CV LINE RIGHT  01/13/2018   IR IVC FILTER PLMT / S&I /IMG GUID/MOD SED  01/27/2018   IR RENAL SELECTIVE  UNI INC S&I MOD SED  01/13/2018   IR US GUIDE VASC ACCESS RIGHT  01/13/2018   KIDNEY CYST REMOVAL  2019   LAPAROSCOPIC LYSIS OF ADHESIONS N/A 02/12/2016   Procedure: LAPAROSCOPIC LYSIS OF ADHESIONS;  Surgeon: Avel Peace, MD;  Location: [facility] OR;  Service: General;  Laterality: N/A;   LEFT AND RIGHT HEART CATHETERIZATION WITH CORONARY ANGIOGRAM N/A 08/04/2012   Procedure: LEFT AND RIGHT HEART CATHETERIZATION WITH CORONARY ANGIOGRAM;  Surgeon: Ricki Rodriguez, MD;  Location: [facility] CATH LAB;  Service: Cardiovascular;  Laterality: N/A;   NEPHRECTOMY  2000   right   REVERSE SHOULDER ARTHROPLASTY Right 09/20/2022   Procedure: REVERSE SHOULDER ARTHROPLASTY;  Surgeon: Yolonda Kida, MD;  Location: [facility] OR;  Service: Orthopedics;  Laterality: Right;  120   REVISON OF ARTERIOVENOUS FISTULA Left 06/05/2013   Procedure: REVISON OF LEFT RADIOCEPHALIC  ARTERIOVENOUS FISTULA;  Surgeon: Fransisco Hertz, MD;  Location: [facility] OR;  Service: Vascular;  Laterality: Left;   REVISON OF ARTERIOVENOUS FISTULA Left 01/27/2016   Procedure: REVISION OF LEFT UPPER EXTREMITY ARTERIOVENOUS FISTULA;  Surgeon: Maeola Harman, MD;  Location: [facility] OR;  Service: Vascular;  Laterality: Left;   REVISON OF ARTERIOVENOUS FISTULA Left 08/03/2016   Procedure: REVISON OF Left arm ARTERIOVENOUS FISTULA;  Surgeon: Sherren Kerns, MD;  Location: [facility] OR;   Service: Vascular;  Laterality: Left;   REVISON OF ARTERIOVENOUS FISTULA Left 05/06/2017   Procedure: REVISION PLICATION OF ARTERIOVENOUS FISTULA LEFT ARM;  Surgeon: Larina Earthly, MD;  [facility];  Service: Vascular;  Laterality: Left;   REVISON OF ARTERIOVENOUS FISTULA Left 02/01/2019   Procedure: REVISION OF ARTERIOVENOUS  FISTULA LEFT ARM;  Surgeon: Cephus Shelling, MD;  Location: [facility] OR;  Service: Vascular;  Laterality: Left;   REVISON OF ARTERIOVENOUS FISTULA Left 03/15/2019   Procedure: REVISION PLICATION OF ARTERIOVENOUS FISTULA LEFT ARM;  Surgeon: Cephus Shelling, MD;  [facility] OR;  Service: Vascular;  Laterality: Left;   RIGHT HEART CATHETERIZATION N/A 05/21/2011   Procedure: RIGHT HEART CATH;  Surgeon: Ricki Rodriguez, MD;  [facility] CATH LAB;  Service: Cardiovascular;  Laterality: N/A;   smashed  1990's   "left pinky; have a plate in there"   UMBILICAL HERNIA REPAIR N/A 07/04/2012   Procedure: LAPAROSCOPIC UMBILICAL HERNIA;  Surgeon: Lodema Pilot, DO;  Location: [facility] OR;  Service: General;  Laterality: N/A;   US ECHOCARDIOGRAPHY  05/20/11   Family History  Problem Relation Age of Onset   Hypertension Mother    Kidney disease Mother    Social History   Socioeconomic History   Marital status: Married    Spouse name: Not on file   Number of children: Not on file   Years of education: Not on file   Highest education level: Not on file  Occupational History   Occupation: disability  Tobacco Use   Smoking status: Never   Smokeless tobacco: Never  Vaping Use   Vaping status: Never Used  Substance and Sexual Activity   Alcohol use: No    Alcohol/week: 0.0 standard drinks of alcohol   Drug use: Yes    Types: Hydrocodone    Comment: Past hx-none in 20 yrs   Sexual activity: Yes  Other Topics Concern   Not on file  Social History Narrative   Not on file   Social Determinants of Health   Financial Resource Strain: Low Risk  (01/19/2023)   Overall  Financial Resource Strain (CARDIA)    Difficulty of Paying Living Expenses: Not hard at all  Food Insecurity: No Food Insecurity (01/19/2023)   Hunger Vital Sign    Worried About Running Out of Food in the Last Year: Never true    Ran Out of Food in the Last Year: Never true  Transportation Needs: No Transportation Needs (01/19/2023)   PRAPARE - Administrator, Civil Service (Medical): No    Lack of Transportation (Non-Medical): No  Physical Activity: Inactive (01/19/2023)   Exercise Vital Sign    Days of Exercise per Week: 0 days    Minutes of Exercise per Session: 0 min  Stress: No Stress Concern Present (01/19/2023)   [employer] of Occupational Health - Occupational Stress Questionnaire    Feeling of Stress : Not at all  Social Connections: Moderately Integrated (01/19/2023)   Social Connection and Isolation Panel [NHANES]    Frequency of Communication with Friends and Family: More than three times a week    Frequency of Social Gatherings with Friends and Family: Once a week    Attends Religious Services: More than 4 times per year    Active Member of [employer] or Organizations: No    Attends Engineer, structural: Never    Marital Status: Married    Tobacco Counseling Counseling given: Not Answered   Clinical Intake:  Pre-visit preparation completed: Yes  Pain : No/denies pain     Nutritional Status: BMI > 30  Obese Nutritional Risks: Nausea/ vomitting/ diarrhea (comes and goes due to dialysis) Diabetes: Yes CBG done?: No Did pt. bring in CBG monitor from home?: No  How often do you need to have someone help you when  you read instructions, pamphlets, or other written materials from your doctor or pharmacy?: 1 - Never  Interpreter Needed?: No  Information entered by :: NAllen LPN   Activities of Daily Living    01/19/2023   11:06 AM 09/20/2022    4:00 PM  In your present state of health, do you have any difficulty performing the following  activities:  Hearing? 0 0  Vision? 1 1  Comment blind in right eye   Difficulty concentrating or making decisions? 0 0  Walking or climbing stairs? 1 1  Dressing or bathing? 1 1  Doing errands, shopping? 1 1  Preparing Food and eating ? Y   Using the Toilet? Y   In the past six months, have you accidently leaked urine? N   Do you have problems with loss of bowel control? N   Managing your Medications? N   Managing your Finances? N   Housekeeping or managing your Housekeeping? Y     Patient Care Team: [name], FNP as PCP - General (General Practice) Karie Soda, MD as Consulting Physician (General Surgery) Charna Elizabeth, MD as Consulting Physician (Gastroenterology) Orpah Cobb, MD as Consulting Physician (Cardiology) Terrial Rhodes, MD as Consulting Physician (Nephrology) Harlan Stains, [facility] (Inactive) (Pharmacist)  Indicate any recent Medical Services you may have received from other than [facility] providers in the past year (date may be approximate).     Assessment:   This is a routine wellness examination for Kimar.  Hearing/Vision screen Hearing Screening - Comments:: Denies hearing issues Vision Screening - Comments:: Regular eye exams, [facility]  Dietary issues and exercise activities discussed:     Goals Addressed             This Visit's Progress    Patient Stated       01/19/2023, wants to walk more       Depression Screen    01/19/2023   11:16 AM 07/01/2022   10:09 AM 01/07/2022   11:23 AM 12/25/2020    9:30 AM 11/27/2020    8:41 AM 11/15/2019    9:18 AM 05/31/2019    2:17 PM  PHQ 2/9 Scores  PHQ - 2 Score 0 0 0 0 0 0 0  PHQ- 9 Score 3          Fall Risk    01/19/2023   11:16 AM 07/01/2022   10:09 AM 03/16/2022    9:35 AM 01/07/2022   11:22 AM 12/25/2020    9:29 AM  Fall Risk   Falls in the past year? 0 0 0 1 0  Comment    legs gave out   Number falls in past yr: 0  0 1   Injury with Fall? 0  0 0   Risk for fall due to : Medication  side effect;Impaired vision;Impaired mobility;Impaired balance/gait  No Fall Risks Impaired balance/gait;Medication side effect Medication side effect;Impaired vision  Follow up Falls prevention discussed;Falls evaluation completed  Falls evaluation completed Falls evaluation completed;Education provided;Falls prevention discussed Falls evaluation completed;Education provided;Falls prevention discussed    MEDICARE RISK AT HOME: Medicare Risk at Home Any stairs in or around the home?: Yes (ramp) If so, are there any without handrails?: No Home free of loose throw rugs in walkways, pet beds, electrical cords, etc?: Yes Adequate lighting in your home to reduce risk of falls?: Yes Life alert?: No Use of a cane, walker or w/c?: Yes Grab bars in the bathroom?: Yes Shower chair or bench in shower?: No Elevated toilet  seat or a handicapped toilet?: Yes  TIMED UP AND GO:  Was the test performed?  No    Cognitive Function:        01/19/2023   11:17 AM 01/07/2022   11:27 AM 12/25/2020    9:31 AM 11/15/2019    9:19 AM 11/09/2018    8:56 AM  6CIT Screen  What Year? 0 points 0 points 0 points 0 points 0 points  What month? 0 points 0 points 0 points 0 points 0 points  What time? 0 points 0 points 0 points 0 points 0 points  Count back from 20 0 points 0 points 2 points 0 points 0 points  Months in reverse 4 points 0 points 4 points 4 points 4 points  Repeat phrase 4 points 10 points 4 points 4 points 0 points  Total Score 8 points 10 points 10 points 8 points 4 points    Immunizations Immunization History  Administered Date(s) Administered   PFIZER(Purple Top)SARS-COV-2 Vaccination 08/03/2019, 08/29/2019, 02/22/2020   Pfizer Covid-19 Vaccine Bivalent Booster 50yrs & up 04/03/2021   Zoster Recombinant(Shingrix) 11/10/2021    TDAP status: Up to date  Flu Vaccine status: Declined, Education has been provided regarding the importance of this vaccine but patient still declined. Advised may  receive this vaccine at local pharmacy or Health Dept. Aware to provide a copy of the vaccination record if obtained from local pharmacy or Health Dept. Verbalized acceptance and understanding.  Pneumococcal vaccine status: Up to date  Covid-19 vaccine status: Information provided on how to obtain vaccines.   Qualifies for Shingles Vaccine? Yes   Zostavax completed No   Shingrix Completed?: needs second dose  Screening Tests Health Maintenance  Topic Date Due   OPHTHALMOLOGY EXAM  Never done   DTaP/Tdap/Td (1 - Tdap) Never done   Zoster Vaccines- Shingrix (2 of 2) 01/05/2022   COVID-19 Vaccine (5 - 2023-24 season) 01/22/2022   HEMOGLOBIN A1C  09/15/2022   FOOT EXAM  11/11/2022   INFLUENZA VACCINE  08/22/2023 (Originally 12/23/2022)   Medicare Annual Wellness (AWV)  01/19/2024   Colonoscopy  02/02/2027   Hepatitis C Screening  Completed   HIV Screening  Completed   HPV VACCINES  Aged Out    Health Maintenance  Health Maintenance Due  Topic Date Due   OPHTHALMOLOGY EXAM  Never done   DTaP/Tdap/Td (1 - Tdap) Never done   Zoster Vaccines- Shingrix (2 of 2) 01/05/2022   COVID-19 Vaccine (5 - 2023-24 season) 01/22/2022   HEMOGLOBIN A1C  09/15/2022   FOOT EXAM  11/11/2022    Colorectal cancer screening: Type of screening: Colonoscopy. Completed 02/01/2017. Repeat every 7 years  Lung Cancer Screening: (Low Dose CT Chest recommended if Age 18-80 years, 20 pack-year currently smoking OR have quit w/in 15years.) does not qualify.   Lung Cancer Screening Referral: no  Additional Screening:  Hepatitis C Screening: does qualify; Completed 07/18/2018  Vision Screening: Recommended annual ophthalmology exams for early detection of glaucoma and other disorders of the eye. Is the patient up to date with their annual eye exam?  Yes  Who is the provider or what is the name of the office in which the patient attends annual eye exams? [facility] If pt is not established with a provider,  would they like to be referred to a provider to establish care? No .   Dental Screening: Recommended annual dental exams for proper oral hygiene  Diabetic Foot Exam: Diabetic Foot Exam: Completed 11/10/2021  Community Resource Referral /  Chronic Care Management: CRR required this visit?  No   CCM required this visit?  No     Plan:     I have personally reviewed and noted the following in the patient's chart:   Medical and social history Use of alcohol, tobacco or illicit drugs  Current medications and supplements including opioid prescriptions. Patient is currently taking opioid prescriptions. Information provided to patient regarding non-opioid alternatives. Patient advised to discuss non-opioid treatment plan with their provider. Functional ability and status Nutritional status Physical activity Advanced directives List of other physicians Hospitalizations, surgeries, and ER visits in previous 12 months Vitals Screenings to include cognitive, depression, and falls Referrals and appointments  In addition, I have reviewed and discussed with patient certain preventive protocols, quality metrics, and best practice recommendations. A written personalized care plan for preventive services as well as general preventive health recommendations were provided to patient.     Barb Merino, LPN   [id number]   After Visit Summary: in person  Nurse Notes: none

## 2023-01-19 NOTE — Patient Instructions (Signed)
[name] , Thank you for taking time to come for your Medicare Wellness Visit. I appreciate your ongoing commitment to your health goals. Please review the following plan we discussed and let me know if I can assist you in the future.   Referrals/Orders/Follow-Ups/Clinician Recommendations: none  This is a list of the screening recommended for you and due dates:  Health Maintenance  Topic Date Due   Eye exam for diabetics  Never done   DTaP/Tdap/Td vaccine (1 - Tdap) Never done   Zoster (Shingles) Vaccine (2 of 2) 01/05/2022   COVID-19 Vaccine (5 - 2023-24 season) 01/22/2022   Hemoglobin A1C  09/15/2022   Complete foot exam   11/11/2022   Flu Shot  08/22/2023*   Medicare Annual Wellness Visit  01/19/2024   Colon Cancer Screening  02/02/2027   Hepatitis C Screening  Completed   HIV Screening  Completed   HPV Vaccine  Aged Out  *Topic was postponed. The date shown is not the original due date.    Advanced directives: (Declined) Advance directive discussed with you today. Even though you declined this today, please call our office should you change your mind, and we can give you the proper paperwork for you to fill out.  Next Medicare Annual Wellness Visit scheduled for next year: No, office will schedule appointment Managing Pain Without Opioids Opioids are strong medicines used to treat moderate to severe pain. For some people, especially those who have long-term (chronic) pain, opioids may not be the best choice for pain management due to: Side effects like nausea, constipation, and sleepiness. The risk of addiction (opioid use disorder). The longer you take opioids, the greater your risk of addiction. Pain that lasts for more than 3 months is called chronic pain. Managing chronic pain usually requires more than one approach and is often provided by a team of health care providers working together (multidisciplinary approach). Pain management may be done at a pain management center or  pain clinic. How to manage pain without the use of opioids Use non-opioid medicines Non-opioid medicines for pain may include: Over-the-counter or prescription non-steroidal anti-inflammatory drugs (NSAIDs). These may be the first medicines used for pain. They work well for muscle and bone pain, and they reduce swelling. Acetaminophen. This over-the-counter medicine may work well for milder pain but not swelling. Antidepressants. These may be used to treat chronic pain. A certain type of antidepressant (tricyclics) is often used. These medicines are given in lower doses for pain than when used for depression. Anticonvulsants. These are usually used to treat seizures but may also reduce nerve (neuropathic) pain. Muscle relaxants. These relieve pain caused by sudden muscle tightening (spasms). You may also use a pain medicine that is applied to the skin as a patch, cream, or gel (topical analgesic), such as a numbing medicine. These may cause fewer side effects than medicines taken by mouth. Do certain therapies as directed Some therapies can help with pain management. They include: Physical therapy. You will do exercises to gain strength and flexibility. A physical therapist may teach you exercises to move and stretch parts of your body that are weak, stiff, or painful. You can learn these exercises at physical therapy visits and practice them at home. Physical therapy may also involve: Massage. Heat wraps or applying heat or cold to affected areas. Electrical signals that interrupt pain signals (transcutaneous electrical nerve stimulation, TENS). Weak lasers that reduce pain and swelling (low-level laser therapy). Signals from your body that help you learn to  regulate pain (biofeedback). Occupational therapy. This helps you to learn ways to function at home and work with less pain. Recreational therapy. This involves trying new activities or hobbies, such as a physical activity or drawing. Mental  health therapy, including: Cognitive behavioral therapy (CBT). This helps you learn coping skills for dealing with pain. Acceptance and commitment therapy (ACT) to change the way you think and react to pain. Relaxation therapies, including muscle relaxation exercises and mindfulness-based stress reduction. Pain management counseling. This may be individual, family, or group counseling.  Receive medical treatments Medical treatments for pain management include: Nerve block injections. These may include a pain blocker and anti-inflammatory medicines. You may have injections: Near the spine to relieve chronic back or neck pain. Into joints to relieve back or joint pain. Into nerve areas that supply a painful area to relieve body pain. Into muscles (trigger point injections) to relieve some painful muscle conditions. A medical device placed near your spine to help block pain signals and relieve nerve pain or chronic back pain (spinal cord stimulation device). Acupuncture. Follow these instructions at home Medicines Take over-the-counter and prescription medicines only as told by your health care provider. If you are taking pain medicine, ask your health care providers about possible side effects to watch out for. Do not drive or use heavy machinery while taking prescription opioid pain medicine. Lifestyle  Do not use drugs or alcohol to reduce pain. If you drink alcohol, limit how much you have to: 0-1 drink a day for women who are not pregnant. 0-2 drinks a day for men. Know how much alcohol is in a drink. In the [place] one drink equals one 12 oz bottle of beer (355 mL), one 5 oz glass of wine (148 mL), or one 1 oz glass of hard liquor (44 mL). Do not use any products that contain nicotine or tobacco. These products include cigarettes, chewing tobacco, and vaping devices, such as e-cigarettes. If you need help quitting, ask your health care provider. Eat a healthy diet and maintain a healthy  weight. Poor diet and excess weight may make pain worse. Eat foods that are high in fiber. These include fresh fruits and vegetables, whole grains, and beans. Limit foods that are high in fat and processed sugars, such as fried and sweet foods. Exercise regularly. Exercise lowers stress and may help relieve pain. Ask your health care provider what activities and exercises are safe for you. If your health care provider approves, join an exercise class that combines movement and stress reduction. Examples include yoga and tai chi. Get enough sleep. Lack of sleep may make pain worse. Lower stress as much as possible. Practice stress reduction techniques as told by your therapist. General instructions Work with all your pain management providers to find the treatments that work best for you. You are an important member of your pain management team. There are many things you can do to reduce pain on your own. Consider joining an online or in-person support group for people who have chronic pain. Keep all follow-up visits. This is important. Where to find more information You can find more information about managing pain without opioids from: American Academy of Pain Medicine: painmed.org Institute for Chronic Pain: instituteforchronicpain.org American Chronic Pain Association: theacpa.org Contact a health care provider if: You have side effects from pain medicine. Your pain gets worse or does not get better with treatments or home therapy. You are struggling with anxiety or depression. Summary Many types of pain can be  managed without opioids. Chronic pain may respond better to pain management without opioids. Pain is best managed when you and a team of health care providers work together. Pain management without opioids may include non-opioid medicines, medical treatments, physical therapy, mental health therapy, and lifestyle changes. Tell your health care providers if your pain gets worse or  is not being managed well enough. This information is not intended to replace advice given to you by your health care provider. Make sure you discuss any questions you have with your health care provider. Document Revised: 08/20/2020 Document Reviewed: 08/20/2020 Elsevier Patient Education  2024 Elsevier Inc.   Insert preventive care Attachment reference

## 2023-01-21 DIAGNOSIS — Z992 Dependence on renal dialysis: Secondary | ICD-10-CM | POA: Diagnosis not present

## 2023-01-21 DIAGNOSIS — N186 End stage renal disease: Secondary | ICD-10-CM | POA: Diagnosis not present

## 2023-01-21 DIAGNOSIS — N2581 Secondary hyperparathyroidism of renal origin: Secondary | ICD-10-CM | POA: Diagnosis not present

## 2023-01-21 DIAGNOSIS — E213 Hyperparathyroidism, unspecified: Secondary | ICD-10-CM | POA: Diagnosis not present

## 2023-01-22 DIAGNOSIS — N186 End stage renal disease: Secondary | ICD-10-CM | POA: Diagnosis not present

## 2023-01-22 DIAGNOSIS — Z992 Dependence on renal dialysis: Secondary | ICD-10-CM | POA: Diagnosis not present

## 2023-01-22 DIAGNOSIS — I120 Hypertensive chronic kidney disease with stage 5 chronic kidney disease or end stage renal disease: Secondary | ICD-10-CM | POA: Diagnosis not present

## 2023-01-24 DIAGNOSIS — N186 End stage renal disease: Secondary | ICD-10-CM | POA: Diagnosis not present

## 2023-01-24 DIAGNOSIS — Z992 Dependence on renal dialysis: Secondary | ICD-10-CM | POA: Diagnosis not present

## 2023-01-24 DIAGNOSIS — E213 Hyperparathyroidism, unspecified: Secondary | ICD-10-CM | POA: Diagnosis not present

## 2023-01-24 DIAGNOSIS — N2581 Secondary hyperparathyroidism of renal origin: Secondary | ICD-10-CM | POA: Diagnosis not present

## 2023-01-26 DIAGNOSIS — N186 End stage renal disease: Secondary | ICD-10-CM | POA: Diagnosis not present

## 2023-01-26 DIAGNOSIS — Z992 Dependence on renal dialysis: Secondary | ICD-10-CM | POA: Diagnosis not present

## 2023-01-26 DIAGNOSIS — E213 Hyperparathyroidism, unspecified: Secondary | ICD-10-CM | POA: Diagnosis not present

## 2023-01-26 DIAGNOSIS — N2581 Secondary hyperparathyroidism of renal origin: Secondary | ICD-10-CM | POA: Diagnosis not present

## 2023-01-27 DIAGNOSIS — I480 Paroxysmal atrial fibrillation: Secondary | ICD-10-CM | POA: Diagnosis not present

## 2023-01-27 DIAGNOSIS — Z7901 Long term (current) use of anticoagulants: Secondary | ICD-10-CM | POA: Diagnosis not present

## 2023-01-28 DIAGNOSIS — N186 End stage renal disease: Secondary | ICD-10-CM | POA: Diagnosis not present

## 2023-01-28 DIAGNOSIS — Z992 Dependence on renal dialysis: Secondary | ICD-10-CM | POA: Diagnosis not present

## 2023-01-28 DIAGNOSIS — N2581 Secondary hyperparathyroidism of renal origin: Secondary | ICD-10-CM | POA: Diagnosis not present

## 2023-01-28 DIAGNOSIS — E213 Hyperparathyroidism, unspecified: Secondary | ICD-10-CM | POA: Diagnosis not present

## 2023-01-31 DIAGNOSIS — N2581 Secondary hyperparathyroidism of renal origin: Secondary | ICD-10-CM | POA: Diagnosis not present

## 2023-01-31 DIAGNOSIS — E213 Hyperparathyroidism, unspecified: Secondary | ICD-10-CM | POA: Diagnosis not present

## 2023-01-31 DIAGNOSIS — N186 End stage renal disease: Secondary | ICD-10-CM | POA: Diagnosis not present

## 2023-01-31 DIAGNOSIS — Z992 Dependence on renal dialysis: Secondary | ICD-10-CM | POA: Diagnosis not present

## 2023-02-02 DIAGNOSIS — Z992 Dependence on renal dialysis: Secondary | ICD-10-CM | POA: Diagnosis not present

## 2023-02-02 DIAGNOSIS — N186 End stage renal disease: Secondary | ICD-10-CM | POA: Diagnosis not present

## 2023-02-02 DIAGNOSIS — N2581 Secondary hyperparathyroidism of renal origin: Secondary | ICD-10-CM | POA: Diagnosis not present

## 2023-02-02 DIAGNOSIS — E213 Hyperparathyroidism, unspecified: Secondary | ICD-10-CM | POA: Diagnosis not present

## 2023-02-04 DIAGNOSIS — Z992 Dependence on renal dialysis: Secondary | ICD-10-CM | POA: Diagnosis not present

## 2023-02-04 DIAGNOSIS — N2581 Secondary hyperparathyroidism of renal origin: Secondary | ICD-10-CM | POA: Diagnosis not present

## 2023-02-04 DIAGNOSIS — E213 Hyperparathyroidism, unspecified: Secondary | ICD-10-CM | POA: Diagnosis not present

## 2023-02-04 DIAGNOSIS — N186 End stage renal disease: Secondary | ICD-10-CM | POA: Diagnosis not present

## 2023-02-07 DIAGNOSIS — Z992 Dependence on renal dialysis: Secondary | ICD-10-CM | POA: Diagnosis not present

## 2023-02-07 DIAGNOSIS — N186 End stage renal disease: Secondary | ICD-10-CM | POA: Diagnosis not present

## 2023-02-07 DIAGNOSIS — N2581 Secondary hyperparathyroidism of renal origin: Secondary | ICD-10-CM | POA: Diagnosis not present

## 2023-02-07 DIAGNOSIS — E213 Hyperparathyroidism, unspecified: Secondary | ICD-10-CM | POA: Diagnosis not present

## 2023-02-09 DIAGNOSIS — Z992 Dependence on renal dialysis: Secondary | ICD-10-CM | POA: Diagnosis not present

## 2023-02-09 DIAGNOSIS — N186 End stage renal disease: Secondary | ICD-10-CM | POA: Diagnosis not present

## 2023-02-09 DIAGNOSIS — N2581 Secondary hyperparathyroidism of renal origin: Secondary | ICD-10-CM | POA: Diagnosis not present

## 2023-02-09 DIAGNOSIS — E213 Hyperparathyroidism, unspecified: Secondary | ICD-10-CM | POA: Diagnosis not present

## 2023-02-10 DIAGNOSIS — M19012 Primary osteoarthritis, left shoulder: Secondary | ICD-10-CM | POA: Diagnosis not present

## 2023-02-10 DIAGNOSIS — Z96611 Presence of right artificial shoulder joint: Secondary | ICD-10-CM | POA: Diagnosis not present

## 2023-02-11 DIAGNOSIS — E213 Hyperparathyroidism, unspecified: Secondary | ICD-10-CM | POA: Diagnosis not present

## 2023-02-11 DIAGNOSIS — Z992 Dependence on renal dialysis: Secondary | ICD-10-CM | POA: Diagnosis not present

## 2023-02-11 DIAGNOSIS — N186 End stage renal disease: Secondary | ICD-10-CM | POA: Diagnosis not present

## 2023-02-11 DIAGNOSIS — N2581 Secondary hyperparathyroidism of renal origin: Secondary | ICD-10-CM | POA: Diagnosis not present

## 2023-02-14 DIAGNOSIS — N186 End stage renal disease: Secondary | ICD-10-CM | POA: Diagnosis not present

## 2023-02-14 DIAGNOSIS — Z992 Dependence on renal dialysis: Secondary | ICD-10-CM | POA: Diagnosis not present

## 2023-02-14 DIAGNOSIS — N2581 Secondary hyperparathyroidism of renal origin: Secondary | ICD-10-CM | POA: Diagnosis not present

## 2023-02-14 DIAGNOSIS — E213 Hyperparathyroidism, unspecified: Secondary | ICD-10-CM | POA: Diagnosis not present

## 2023-02-15 ENCOUNTER — Encounter (HOSPITAL_COMMUNITY): Payer: Self-pay | Admitting: *Deleted

## 2023-02-16 DIAGNOSIS — N186 End stage renal disease: Secondary | ICD-10-CM | POA: Diagnosis not present

## 2023-02-16 DIAGNOSIS — E213 Hyperparathyroidism, unspecified: Secondary | ICD-10-CM | POA: Diagnosis not present

## 2023-02-16 DIAGNOSIS — N2581 Secondary hyperparathyroidism of renal origin: Secondary | ICD-10-CM | POA: Diagnosis not present

## 2023-02-16 DIAGNOSIS — Z992 Dependence on renal dialysis: Secondary | ICD-10-CM | POA: Diagnosis not present

## 2023-02-18 DIAGNOSIS — E213 Hyperparathyroidism, unspecified: Secondary | ICD-10-CM | POA: Diagnosis not present

## 2023-02-18 DIAGNOSIS — N2581 Secondary hyperparathyroidism of renal origin: Secondary | ICD-10-CM | POA: Diagnosis not present

## 2023-02-18 DIAGNOSIS — N186 End stage renal disease: Secondary | ICD-10-CM | POA: Diagnosis not present

## 2023-02-18 DIAGNOSIS — Z992 Dependence on renal dialysis: Secondary | ICD-10-CM | POA: Diagnosis not present

## 2023-02-21 DIAGNOSIS — E213 Hyperparathyroidism, unspecified: Secondary | ICD-10-CM | POA: Diagnosis not present

## 2023-02-21 DIAGNOSIS — N186 End stage renal disease: Secondary | ICD-10-CM | POA: Diagnosis not present

## 2023-02-21 DIAGNOSIS — I120 Hypertensive chronic kidney disease with stage 5 chronic kidney disease or end stage renal disease: Secondary | ICD-10-CM | POA: Diagnosis not present

## 2023-02-21 DIAGNOSIS — N2581 Secondary hyperparathyroidism of renal origin: Secondary | ICD-10-CM | POA: Diagnosis not present

## 2023-02-21 DIAGNOSIS — Z992 Dependence on renal dialysis: Secondary | ICD-10-CM | POA: Diagnosis not present

## 2023-02-23 DIAGNOSIS — N186 End stage renal disease: Secondary | ICD-10-CM | POA: Diagnosis not present

## 2023-02-23 DIAGNOSIS — N2581 Secondary hyperparathyroidism of renal origin: Secondary | ICD-10-CM | POA: Diagnosis not present

## 2023-02-23 DIAGNOSIS — Z992 Dependence on renal dialysis: Secondary | ICD-10-CM | POA: Diagnosis not present

## 2023-02-24 ENCOUNTER — Encounter (HOSPITAL_COMMUNITY): Payer: Self-pay | Admitting: *Deleted

## 2023-02-25 DIAGNOSIS — Z992 Dependence on renal dialysis: Secondary | ICD-10-CM | POA: Diagnosis not present

## 2023-02-25 DIAGNOSIS — N186 End stage renal disease: Secondary | ICD-10-CM | POA: Diagnosis not present

## 2023-02-25 DIAGNOSIS — N2581 Secondary hyperparathyroidism of renal origin: Secondary | ICD-10-CM | POA: Diagnosis not present

## 2023-02-28 DIAGNOSIS — N186 End stage renal disease: Secondary | ICD-10-CM | POA: Diagnosis not present

## 2023-02-28 DIAGNOSIS — Z992 Dependence on renal dialysis: Secondary | ICD-10-CM | POA: Diagnosis not present

## 2023-02-28 DIAGNOSIS — N2581 Secondary hyperparathyroidism of renal origin: Secondary | ICD-10-CM | POA: Diagnosis not present

## 2023-03-02 DIAGNOSIS — N2581 Secondary hyperparathyroidism of renal origin: Secondary | ICD-10-CM | POA: Diagnosis not present

## 2023-03-02 DIAGNOSIS — N186 End stage renal disease: Secondary | ICD-10-CM | POA: Diagnosis not present

## 2023-03-02 DIAGNOSIS — Z992 Dependence on renal dialysis: Secondary | ICD-10-CM | POA: Diagnosis not present

## 2023-03-03 ENCOUNTER — Encounter: Payer: Self-pay | Admitting: Nurse Practitioner

## 2023-03-03 ENCOUNTER — Ambulatory Visit: Payer: Medicare PPO | Admitting: Nurse Practitioner

## 2023-03-03 ENCOUNTER — Encounter (HOSPITAL_COMMUNITY): Payer: Self-pay | Admitting: *Deleted

## 2023-03-03 VITALS — BP 120/60 | HR 73 | Temp 97.7°F | Ht 68.0 in | Wt 235.0 lb

## 2023-03-03 DIAGNOSIS — E782 Mixed hyperlipidemia: Secondary | ICD-10-CM | POA: Diagnosis not present

## 2023-03-03 DIAGNOSIS — I700 Atherosclerosis of aorta: Secondary | ICD-10-CM | POA: Diagnosis not present

## 2023-03-03 DIAGNOSIS — E1122 Type 2 diabetes mellitus with diabetic chronic kidney disease: Secondary | ICD-10-CM | POA: Diagnosis not present

## 2023-03-03 DIAGNOSIS — Z8673 Personal history of transient ischemic attack (TIA), and cerebral infarction without residual deficits: Secondary | ICD-10-CM

## 2023-03-03 DIAGNOSIS — Z01818 Encounter for other preprocedural examination: Secondary | ICD-10-CM

## 2023-03-03 DIAGNOSIS — Z992 Dependence on renal dialysis: Secondary | ICD-10-CM | POA: Diagnosis not present

## 2023-03-03 DIAGNOSIS — I132 Hypertensive heart and chronic kidney disease with heart failure and with stage 5 chronic kidney disease, or end stage renal disease: Secondary | ICD-10-CM | POA: Diagnosis not present

## 2023-03-03 DIAGNOSIS — N186 End stage renal disease: Secondary | ICD-10-CM

## 2023-03-03 DIAGNOSIS — M25512 Pain in left shoulder: Secondary | ICD-10-CM | POA: Diagnosis not present

## 2023-03-03 DIAGNOSIS — G8929 Other chronic pain: Secondary | ICD-10-CM | POA: Diagnosis not present

## 2023-03-03 DIAGNOSIS — I509 Heart failure, unspecified: Secondary | ICD-10-CM | POA: Diagnosis not present

## 2023-03-03 DIAGNOSIS — Z2821 Immunization not carried out because of patient refusal: Secondary | ICD-10-CM

## 2023-03-03 NOTE — Progress Notes (Signed)
[name], CMA,acting as a Neurosurgeon for Matthew Felts, FNP.,have documented all relevant documentation on the behalf of Matthew Felts, FNP,as directed by  Matthew Felts, FNP while in the presence of Matthew Felts, FNP.  Subjective:  Patient ID: [name] , male    DOB: [birth year] , 62 y.o.   MRN: [id number]  Chief Complaint  Patient presents with   Pre-op Exam    HPI  Patient presents today for a pre-op evaluation, Patient reports compliance with medication. Patient denies any chest pain, SOB, or headaches. Patient has no concerns today. Patient is having left shoulder surgery.  He has been cleared by Cardiology per patient. Continues dialysis on MWF. Doing well with that. Does not need any refills  BP Readings from Last 3 Encounters: 03/03/23 : 120/60 01/19/23 : 110/60 09/21/22 : (!) 175/102       Past Medical History:  Diagnosis Date   Acute lower GI bleeding 06/21/2018   Acute respiratory failure with hypoxia (HCC) 01/13/2015   Arthritis    Atrial flutter (HCC)    Bile leak, postoperative 03/15/2016   Biliary dyskinesia 02/12/2016   Blind right eye    Hx: of partial blindness in right eye   Cardiomyopathy    CHF (congestive heart failure) (HCC)    Coronary artery disease    normal coronaries by 10/10/08 cath, Cardiac Cath 08-04-12 epic.Dr. Algie Coffer follows   Diabetes mellitus    pt. states he's borderline diabetic., no longer taking med,- off med. since 2013   Dialysis patient [facility])    Mon-Wed-Fri([facility])- Left AV fistula   Diverticulitis 03/2015   reoccurred in December 2016   Diverticulitis of intestine without perforation or abscess without bleeding 04/21/2015   Dizziness 11/22/2018   DVT (deep venous thrombosis) (HCC)    ESRD (end stage renal disease) (HCC)    GERD (gastroesophageal reflux disease)    Gout    Hemorrhage of left kidney 01/13/2018   History of nephrectomy 07/04/2012   History of right nephrectomy in 2002 for renal cell carcinoma     History of unilateral nephrectomy    Hypertension    Hypotension due to hypovolemia 06/04/2020   Low iron    Myocardial infarction [facility]) ?2006   Renal cell carcinoma    dialysis- MWF- [address]- Dr. Briant Cedar follows.   Renal insufficiency    Shortness of breath 05/19/2011   "at rest, lying down, w/exertion"   Stroke [facility]) 02/2011   05/19/11 denies residual   Umbilical hernia 05/19/2011   unrepaired   Wears glasses      Family History  Problem Relation Age of Onset   Hypertension Mother    Kidney disease Mother      Current Outpatient Medications:    albuterol (VENTOLIN HFA) 108 (90 Base) MCG/ACT inhaler, Inhale 2 puffs into the lungs every 6 (six) hours as needed for wheezing or shortness of breath., Disp: 18 g, Rfl: 2   calcium acetate (PHOSLO) 667 MG capsule, Take 1-3 capsules (667-2,001 mg total) by mouth See admin instructions. Take 2001 mg capsules with meals three times daily and 667 mg capsule with snacks., Disp:  , Rfl:    chlorhexidine (PERIDEX) 0.12 % solution, 15 mLs by Mouth Rinse route 2 (two) times daily., Disp: , Rfl:    Control Gel Formula Dressing (DUODERM CGF EXTRA THIN) MISC, Apply 1 each topically daily as needed., Disp: 10 each, Rfl: 2   digoxin (LANOXIN) 0.125 MG tablet, Take 1 tablet (0.125 mg total) by  mouth every Monday, Wednesday, and Friday., Disp: 30 tablet, Rfl: 1   gabapentin (NEURONTIN) 300 MG capsule, Take 1 capsule (300 mg total) by mouth at bedtime., Disp: 90 capsule, Rfl: 3   glucose blood test strip, Use to check blood sugar 3 times a day. Dx code e11.65, Disp: 100 each, Rfl: 12   HYDROcodone-acetaminophen (NORCO) 10-325 MG tablet, Take 0.5 tablets by mouth 2 (two) times daily as needed for moderate pain., Disp: , Rfl:    isosorbide dinitrate (ISORDIL) 10 MG tablet, Take 10 mg by mouth 2 (two) times daily., Disp: , Rfl:    Lactobacillus (ACIDOPHILUS/BIFIDUS) WAFR, Take 1 Wafer by mouth daily., Disp: 90 Wafer, Rfl: 1   Lancets (ONETOUCH  DELICA PLUS LANCET33G) MISC, Use to check blood sugar 3 times a day. Dx code e11.65, Disp: 100 each, Rfl: 3   lidocaine (LIDODERM) 5 %, Place 1 patch onto the skin daily. Remove & Discard patch within 12 hours or as directed by MD, Disp: 30 patch, Rfl: 0   loperamide (IMODIUM A-D) 2 MG tablet, Take 1 tablet (2 mg total) by mouth 4 (four) times daily as needed for diarrhea or loose stools. Also available OTC., Disp: 30 tablet, Rfl: 0   meclizine (ANTIVERT) 25 MG tablet, Take 1 tablet (25 mg total) by mouth 3 (three) times daily as needed for dizziness., Disp: 30 tablet, Rfl: 0   metoprolol succinate (TOPROL-XL) 25 MG 24 hr tablet, Take 1 tablet (25 mg total) by mouth daily., Disp: 30 tablet, Rfl: 3   midodrine (PROAMATINE) 10 MG tablet, Take 1 tablet (10 mg total) by mouth 3 (three) times daily., Disp: 90 tablet, Rfl: 3   multivitamin (RENA-VIT) TABS tablet, Take 1 tablet by mouth at bedtime., Disp: , Rfl:    nitroGLYCERIN (NITROSTAT) 0.4 MG SL tablet, Place 0.4 mg under the tongue every 5 (five) minutes as needed for chest pain. , Disp: , Rfl: 0   Nutritional Supplements (FEEDING SUPPLEMENT, NEPRO CARB STEADY,) LIQD, Take 237 mLs by mouth daily., Disp: , Rfl:    ondansetron (ZOFRAN ODT) 4 MG disintegrating tablet, Take 1 tablet (4 mg total) by mouth every 8 (eight) hours as needed for nausea or vomiting., Disp: 20 tablet, Rfl: 0   pantoprazole (PROTONIX) 40 MG tablet, Take 40 mg by mouth 2 (two) times daily., Disp: , Rfl:    sucroferric oxyhydroxide (VELPHORO) 500 MG chewable tablet, Chew 500-1,000 mg by mouth See admin instructions. Takes 2 tablets with each meal and 1 tablet with snacks., Disp: , Rfl:    UNABLE TO FIND, Out patient physical therapy- vestibular therapy for BPPV, Disp: 1 each, Rfl: 0   warfarin (COUMADIN) 7.5 MG tablet, Take 7.5 mg by mouth daily., Disp: , Rfl:    Etelcalcetide HCl (PARSABIV IV), , Disp: , Rfl:    HYDROcodone-acetaminophen (NORCO) 7.5-325 MG tablet, Take 1 tablet by  mouth every 6 (six) hours as needed for moderate pain., Disp: 28 tablet, Rfl: 0   Allergies  Allergen Reactions   Ace Inhibitors Itching, Cough and Other (See Comments)     Review of Systems  Constitutional: Negative.   HENT: Negative.    Eyes: Negative.   Respiratory: Negative.    Cardiovascular: Negative.   Gastrointestinal: Negative.   Neurological: Negative.   Psychiatric/Behavioral: Negative.       Today's Vitals   03/03/23 1214  BP: 120/60  Pulse: 73  Temp: 97.7 F (36.5 C)  TempSrc: Oral  Weight: 235 lb (106.6 kg)  Height: 5\' 8"  (  1.727 m)  PainSc: 10-Worst pain ever  PainLoc: Shoulder   Body mass index is 35.73 kg/m.  Wt Readings from Last 3 Encounters:  03/10/23 209 lb 11.2 oz (95.1 kg)  03/03/23 235 lb (106.6 kg)  01/19/23 232 lb 9.6 oz (105.5 kg)      Objective:  Physical Exam Vitals reviewed.  Constitutional:      General: He is not in acute distress.    Appearance: Normal appearance. He is obese.  Cardiovascular:     Rate and Rhythm: Normal rate and regular rhythm.     Pulses: Normal pulses.     Heart sounds: Normal heart sounds. No murmur heard. Pulmonary:     Effort: Pulmonary effort is normal. No respiratory distress.     Breath sounds: Normal breath sounds. No wheezing.  Skin:    General: Skin is warm and dry.     Capillary Refill: Capillary refill takes less than 2 seconds.  Neurological:     General: No focal deficit present.     Mental Status: He is alert and oriented to person, place, and time.     Cranial Nerves: No cranial nerve deficit.     Motor: No weakness.  Psychiatric:        Mood and Affect: Mood normal.        Behavior: Behavior normal.        Thought Content: Thought content normal.        Judgment: Judgment normal.         Assessment And Plan:  Pre-op evaluation Assessment & Plan: Pending his lab results will be cleared medically with moderate risk. EKG done with Nonspecific QRS widening.  -T-abnormality -Lateral  ischemia. HR 97. He will need to hold his coumadin most likely but needs to get that instruction from Cardiology    Orders: -     EKG 12-Lead -     CBC with Differential/Platelet -     Hemoglobin A1c -     Basic metabolic panel -     Protime-INR  Type 2 diabetes mellitus with end-stage renal disease (HCC) Assessment & Plan: Hgba1c has improved and has been controlled. No current medications.   Orders: -     Hemoglobin A1c -     Ambulatory referral to Ophthalmology  Hypertensive heart and CKD, ESRD on dialysis, w CHF (HCC) Assessment & Plan: Blood pressure is well controlled, continue current medications  Orders: -     EKG 12-Lead -     Basic metabolic panel  Mixed hyperlipidemia Assessment & Plan: Cholesterol levels have been stable, continue statin. Tolerating well.   Orders: -     Lipid panel  Herpes zoster vaccination declined Assessment & Plan: Declines shingrix, educated on disease process and is aware if he changes his mind to notify office    Tetanus, diphtheria, and acellular pertussis (Tdap) vaccination declined  Atherosclerosis of aorta (HCC) Assessment & Plan: Continue statin   History of CVA (cerebrovascular accident)  Chronic left shoulder pain Assessment & Plan: Awaiting surgery once cleared.      Return for controlled DM check 4 months.  Patient was given opportunity to ask questions. Patient verbalized understanding of the plan and was able to repeat key elements of the plan. All questions were answered to their satisfaction.    Jeanell Sparrow, FNP, have reviewed all documentation for this visit. The documentation on 03/03/23 for the exam, diagnosis, procedures, and orders are all accurate and complete.   IF YOU HAVE BEEN  REFERRED TO A SPECIALIST, IT MAY TAKE 1-2 WEEKS TO SCHEDULE/PROCESS THE REFERRAL. IF YOU HAVE NOT HEARD FROM US/SPECIALIST IN TWO WEEKS, PLEASE GIVE [place] A CALL AT [phone].

## 2023-03-04 DIAGNOSIS — N186 End stage renal disease: Secondary | ICD-10-CM | POA: Diagnosis not present

## 2023-03-04 DIAGNOSIS — N2581 Secondary hyperparathyroidism of renal origin: Secondary | ICD-10-CM | POA: Diagnosis not present

## 2023-03-04 DIAGNOSIS — Z992 Dependence on renal dialysis: Secondary | ICD-10-CM | POA: Diagnosis not present

## 2023-03-04 LAB — BASIC METABOLIC PANEL
BUN/Creatinine Ratio: 3 — ABNORMAL LOW (ref 9–20)
BUN: 19 mg/dL (ref 6–24)
CO2: 23 mmol/L (ref 20–29)
Calcium: 9.7 mg/dL (ref 8.7–10.2)
Chloride: 92 mmol/L — ABNORMAL LOW (ref 96–106)
Creatinine, Ser: 7.12 mg/dL — ABNORMAL HIGH (ref 0.76–1.27)
Glucose: 82 mg/dL (ref 70–99)
Potassium: 5 mmol/L (ref 3.5–5.2)
Sodium: 137 mmol/L (ref 134–144)
eGFR: 8 mL/min/{1.73_m2} — ABNORMAL LOW (ref >59)

## 2023-03-04 LAB — CBC WITH DIFFERENTIAL/PLATELET
Basophils Absolute: 0.1 10*3/uL (ref 0.0–0.2)
Basos: 1 %
EOS (ABSOLUTE): 0.2 10*3/uL (ref 0.0–0.4)
Eos: 3 %
Hematocrit: 36.1 % — ABNORMAL LOW (ref 37.5–51.0)
Hemoglobin: 11.8 g/dL — ABNORMAL LOW (ref 13.0–17.7)
Immature Grans (Abs): 0 10*3/uL (ref 0.0–0.1)
Immature Granulocytes: 1 %
Lymphocytes Absolute: 1.8 10*3/uL (ref 0.7–3.1)
Lymphs: 29 %
MCH: 29.8 pg (ref 26.6–33.0)
MCHC: 32.7 g/dL (ref 31.5–35.7)
MCV: 91 fL (ref 79–97)
Monocytes Absolute: 0.5 10*3/uL (ref 0.1–0.9)
Monocytes: 8 %
Neutrophils Absolute: 3.8 10*3/uL (ref 1.4–7.0)
Neutrophils: 58 %
Platelets: 252 10*3/uL (ref 150–450)
RBC: 3.96 x10E6/uL — ABNORMAL LOW (ref 4.14–5.80)
RDW: 16 % — ABNORMAL HIGH (ref 11.6–15.4)
WBC: 6.4 10*3/uL (ref 3.4–10.8)

## 2023-03-04 LAB — HEMOGLOBIN A1C
Est. average glucose Bld gHb Est-mCnc: 114 mg/dL
Hgb A1c MFr Bld: 5.6 % (ref 4.8–5.6)

## 2023-03-04 LAB — LIPID PANEL
Chol/HDL Ratio: 3.9 {ratio} (ref 0.0–5.0)
Cholesterol, Total: 204 mg/dL — ABNORMAL HIGH (ref 100–199)
HDL: 52 mg/dL (ref >39)
LDL Chol Calc (NIH): 126 mg/dL — ABNORMAL HIGH (ref 0–99)
Triglycerides: 144 mg/dL (ref 0–149)
VLDL Cholesterol Cal: 26 mg/dL (ref 5–40)

## 2023-03-04 LAB — PROTIME-INR
INR: 2.4 — ABNORMAL HIGH (ref 0.9–1.2)
Prothrombin Time: 24.8 s — ABNORMAL HIGH (ref 9.1–12.0)

## 2023-03-05 DIAGNOSIS — Z7901 Long term (current) use of anticoagulants: Secondary | ICD-10-CM | POA: Diagnosis not present

## 2023-03-05 DIAGNOSIS — I480 Paroxysmal atrial fibrillation: Secondary | ICD-10-CM | POA: Diagnosis not present

## 2023-03-07 DIAGNOSIS — N2581 Secondary hyperparathyroidism of renal origin: Secondary | ICD-10-CM | POA: Diagnosis not present

## 2023-03-07 DIAGNOSIS — Z992 Dependence on renal dialysis: Secondary | ICD-10-CM | POA: Diagnosis not present

## 2023-03-07 DIAGNOSIS — N186 End stage renal disease: Secondary | ICD-10-CM | POA: Diagnosis not present

## 2023-03-09 DIAGNOSIS — N2581 Secondary hyperparathyroidism of renal origin: Secondary | ICD-10-CM | POA: Diagnosis not present

## 2023-03-09 DIAGNOSIS — N186 End stage renal disease: Secondary | ICD-10-CM | POA: Diagnosis not present

## 2023-03-09 DIAGNOSIS — Z992 Dependence on renal dialysis: Secondary | ICD-10-CM | POA: Diagnosis not present

## 2023-03-09 NOTE — Pre-Procedure Instructions (Addendum)
Surgical Instructions   Your procedure is scheduled on March 18, 2023. Report to [facility] Main Entrance "A" at 12:30 P.M., then check in with the Admitting office. Any questions or running late day of surgery: call [phone]  Questions prior to your surgery date: call [phone], Monday-Friday, 8am-4pm. If you experience any cold or flu symptoms such as cough, fever, chills, shortness of breath, etc. between now and your scheduled surgery, please notify us at the above number.     Remember:  Do not eat after midnight the night before your surgery  You may drink clear liquids until 11:30 AM the morning of your surgery.   Clear liquids allowed are: Water, Non-Citrus Juices (without pulp), Carbonated Beverages, Clear Tea, Black Coffee Only (NO MILK, CREAM OR POWDERED CREAMER of any kind), and Gatorade.  Patient Instructions  The night before surgery:  No food after midnight. ONLY clear liquids after midnight  The day of surgery (if you have diabetes): Drink ONE (1) 12 oz G2 given to you in your pre admission testing appointment by 11:30 AM the morning of surgery. Drink in one sitting. Do not sip.  This drink was given to you during your hospital  pre-op appointment visit.  Nothing else to drink after completing the  12 oz bottle of G2.         If you have questions, please contact your surgeon's office.     Take these medicines the morning of surgery with A SIP OF WATER: cinacalcet (SENSIPAR)  digoxin (LANOXIN)  ipratropium (ATROVENT) nasal spray  isosorbide dinitrate (ISORDIL)  metoprolol succinate (TOPROL-XL)  midodrine (PROAMATINE)  pantoprazole (PROTONIX)    May take these medicines IF NEEDED: albuterol (VENTOLIN HFA) inhaler  HYDROcodone-acetaminophen (NORCO)  meclizine (ANTIVERT)  nitroGLYCERIN (NITROSTAT) - please call [phone] if dose taken prior to surgery ondansetron (ZOFRAN ODT)  loperamide (IMODIUM A-D) - do not take more than one dose on day of  surgery    Follow your surgeon's/prescriber's instructions on when to stop warfarin (COUMADIN).  If no instructions were given by your surgeon then you will need to call the office to get those instructions.     One week prior to surgery, STOP taking any Aspirin (unless otherwise instructed by your surgeon) Aleve, Naproxen, Ibuprofen, Motrin, Advil, Goody's, BC's, all herbal medications, fish oil, and non-prescription vitamins. This includes your medication: diclofenac Sodium (VOLTAREN) GEL                      Do NOT Smoke (Tobacco/Vaping) for 24 hours prior to your procedure.  If you use a CPAP at night, you may bring your mask/headgear for your overnight stay.   You will be asked to remove any contacts, glasses, piercing's, hearing aid's, dentures/partials prior to surgery. Please bring cases for these items if needed.    Patients discharged the day of surgery will not be allowed to drive home, and someone needs to stay with them for 24 hours.  SURGICAL WAITING ROOM VISITATION Patients may have no more than 2 support people in the waiting area - these visitors may rotate.   Pre-op nurse will coordinate an appropriate time for 1 ADULT support person, who may not rotate, to accompany patient in pre-op.  Children under the age of 43 must have an adult with them who is not the patient and must remain in the main waiting area with an adult.  If the patient needs to stay at the hospital during part of their recovery, the  visitor guidelines for inpatient rooms apply.  Please refer to the [facility] website for the visitor guidelines for any additional information.   If you received a COVID test during your pre-op visit  it is requested that you wear a mask when out in public, stay away from anyone that may not be feeling well and notify your surgeon if you develop symptoms. If you have been in contact with anyone that has tested positive in the last 10 days please notify you surgeon.       Pre-operative 5 CHG Bathing Instructions   You can play a key role in reducing the risk of infection after surgery. Your skin needs to be as free of germs as possible. You can reduce the number of germs on your skin by washing with CHG (chlorhexidine gluconate) soap before surgery. CHG is an antiseptic soap that kills germs and continues to kill germs even after washing.   DO NOT use if you have an allergy to chlorhexidine/CHG or antibacterial soaps. If your skin becomes reddened or irritated, stop using the CHG and notify one of our RNs at [phone].   Please shower with the CHG soap starting 4 days before surgery using the following schedule:     Please keep in mind the following:  DO NOT shave, including legs and underarms, starting the day of your first shower.   You may shave your face at any point before/day of surgery.  Place clean sheets on your bed the day you start using CHG soap. Use a clean washcloth (not used since being washed) for each shower. DO NOT sleep with pets once you start using the CHG.   CHG Shower Instructions:  Wash your face and private area with normal soap. If you choose to wash your hair, wash first with your normal shampoo.  After you use shampoo/soap, rinse your hair and body thoroughly to remove shampoo/soap residue.  Turn the water OFF and apply about 3 tablespoons (45 ml) of CHG soap to a CLEAN washcloth.  Apply CHG soap ONLY FROM YOUR NECK DOWN TO YOUR TOES (washing for 3-5 minutes)  DO NOT use CHG soap on face, private areas, open wounds, or sores.  Pay special attention to the area where your surgery is being performed.  If you are having back surgery, having someone wash your back for you may be helpful. Wait 2 minutes after CHG soap is applied, then you may rinse off the CHG soap.  Pat dry with a clean towel  Put on clean clothes/pajamas   If you choose to wear lotion, please use ONLY the CHG-compatible lotions on the back of this  paper.   Additional instructions for the day of surgery: DO NOT APPLY any lotions, deodorants, cologne, or perfumes.   Do not bring valuables to the hospital. [facility] is not responsible for any belongings/valuables. Do not wear nail polish, gel polish, artificial nails, or any other type of covering on natural nails (fingers and toes) Do not wear jewelry or makeup Put on clean/comfortable clothes.  Please brush your teeth.  Ask your nurse before applying any prescription medications to the skin.     CHG Compatible Lotions   Aveeno Moisturizing lotion  Cetaphil Moisturizing Cream  Cetaphil Moisturizing Lotion  Clairol Herbal Essence Moisturizing Lotion, Dry Skin  Clairol Herbal Essence Moisturizing Lotion, Extra Dry Skin  Clairol Herbal Essence Moisturizing Lotion, Normal Skin  Curel Age Defying Therapeutic Moisturizing Lotion with Alpha Hydroxy  Curel Extreme Care Body Lotion  Curel Soothing Hands Moisturizing Hand Lotion  Curel Therapeutic Moisturizing Cream, Fragrance-Free  Curel Therapeutic Moisturizing Lotion, Fragrance-Free  Curel Therapeutic Moisturizing Lotion, Original Formula  Eucerin Daily Replenishing Lotion  Eucerin Dry Skin Therapy Plus Alpha Hydroxy Crme  Eucerin Dry Skin Therapy Plus Alpha Hydroxy Lotion  Eucerin Original Crme  Eucerin Original Lotion  Eucerin Plus Crme Eucerin Plus Lotion  Eucerin TriLipid Replenishing Lotion  Keri Anti-Bacterial Hand Lotion  Keri Deep Conditioning Original Lotion Dry Skin Formula Softly Scented  Keri Deep Conditioning Original Lotion, Fragrance Free Sensitive Skin Formula  Keri Lotion Fast Absorbing Fragrance Free Sensitive Skin Formula  Keri Lotion Fast Absorbing Softly Scented Dry Skin Formula  Keri Original Lotion  Keri Skin Renewal Lotion Keri Silky Smooth Lotion  Keri Silky Smooth Sensitive Skin Lotion  Nivea Body Creamy Conditioning Oil  Nivea Body Extra Enriched Lotion  Nivea Body Original Lotion  Nivea  Body Sheer Moisturizing Lotion Nivea Crme  Nivea Skin Firming Lotion  NutraDerm 30 Skin Lotion  NutraDerm Skin Lotion  NutraDerm Therapeutic Skin Cream  NutraDerm Therapeutic Skin Lotion  ProShield Protective Hand Cream  Provon moisturizing lotion   [place]- Preparing for Total Shoulder Arthroplasty  Before surgery, you can play an important role. Because skin is not sterile, your skin needs to be as free of germs as possible. You can reduce the number of germs on your skin by using the following products.   Benzoyl Peroxide Gel  o Reduces the number of germs present on the skin  o Applied twice a day to shoulder area starting two days before surgery   Chlorhexidine Gluconate (CHG) Soap (instructions listed above on how to wash with CHG Soap)  o An antiseptic cleaner that kills germs and bonds with the skin to continue killing germs even after washing  o Used for showering the night before surgery and morning of surgery   ==================================================================  Please follow these instructions carefully:  BENZOYL PEROXIDE 5% GEL  Please do not use if you have an allergy to benzoyl peroxide. If your skin becomes reddened/irritated stop using the benzoyl peroxide.  Starting two days before surgery, apply as follows:  1. Apply benzoyl peroxide in the morning and at night. Apply after taking a shower. If you are not taking a shower clean entire shoulder front, back, and side along with the armpit with a clean wet washcloth.  2. Place a quarter-sized dollop on your SHOULDER and rub in thoroughly, making sure to cover the front, back, and side of your shoulder, along with the armpit.   2 Days prior to Surgery First Dose on _____________ Morning Second Dose on ______________ Night  Day Before Surgery First Dose on ______________ Morning Night before surgery wash (entire body except face and private areas) with CHG Soap THEN Second Dose on  ____________ Night   Morning of Surgery  wash BODY AGAIN with CHG Soap   4. Do NOT apply benzoyl peroxide gel on the day of surgery     Please read over the following fact sheets that you were given.

## 2023-03-10 ENCOUNTER — Encounter (HOSPITAL_COMMUNITY): Payer: Self-pay

## 2023-03-10 ENCOUNTER — Other Ambulatory Visit: Payer: Self-pay

## 2023-03-10 ENCOUNTER — Encounter (HOSPITAL_COMMUNITY)
Admission: RE | Admit: 2023-03-10 | Discharge: 2023-03-10 | Disposition: A | Discharge status: Home / Self Care | Payer: Medicare PPO | Source: Ambulatory Visit | Attending: Orthopedic Surgery | Admitting: Orthopedic Surgery

## 2023-03-10 VITALS — BP 116/69 | HR 93 | Temp 98.3°F | Resp 18 | Ht 68.0 in | Wt 209.7 lb

## 2023-03-10 DIAGNOSIS — Z01812 Encounter for preprocedural laboratory examination: Secondary | ICD-10-CM | POA: Diagnosis not present

## 2023-03-10 DIAGNOSIS — Z96611 Presence of right artificial shoulder joint: Secondary | ICD-10-CM | POA: Insufficient documentation

## 2023-03-10 DIAGNOSIS — Z01818 Encounter for other preprocedural examination: Secondary | ICD-10-CM

## 2023-03-10 DIAGNOSIS — I5020 Unspecified systolic (congestive) heart failure: Secondary | ICD-10-CM | POA: Diagnosis not present

## 2023-03-10 DIAGNOSIS — I340 Nonrheumatic mitral (valve) insufficiency: Secondary | ICD-10-CM | POA: Diagnosis not present

## 2023-03-10 LAB — GLUCOSE, CAPILLARY: Glucose-Capillary: 88 mg/dL (ref 70–99)

## 2023-03-10 LAB — SURGICAL PCR SCREEN
MRSA, PCR: NEGATIVE
Staphylococcus aureus: POSITIVE — AB

## 2023-03-10 NOTE — Pre-Procedure Instructions (Signed)
Surgical Instructions   Your procedure is scheduled on March 18, 2023. Report to [facility] Main Entrance "A" at 12:30 P.M., then check in with the Admitting office. Any questions or running late day of surgery: call [phone]  Questions prior to your surgery date: call [phone], Monday-Friday, 8am-4pm. If you experience any cold or flu symptoms such as cough, fever, chills, shortness of breath, etc. between now and your scheduled surgery, please notify us at the above number.     Remember:  Do not eat after midnight the night before your surgery  You may drink clear liquids until 11:30 AM the morning of your surgery.   Clear liquids allowed are: Water, Non-Citrus Juices (without pulp), Carbonated Beverages, Clear Tea, Black Coffee Only (NO MILK, CREAM OR POWDERED CREAMER of any kind), and Gatorade.  Patient Instructions  The night before surgery:  No food after midnight. ONLY clear liquids after midnight  The day of surgery (if you have diabetes): Drink ONE (1) 12 oz G2 given to you in your pre admission testing appointment by 11:30 AM the morning of surgery. Drink in one sitting. Do not sip.  This drink was given to you during your hospital  pre-op appointment visit.  Nothing else to drink after completing the  12 oz bottle of G2.         If you have questions, please contact your surgeon's office.     Take these medicines the morning of surgery with A SIP OF WATER: cinacalcet (SENSIPAR)  digoxin (LANOXIN)  ipratropium (ATROVENT) nasal spray  isosorbide dinitrate (ISORDIL)  metoprolol succinate (TOPROL-XL)  midodrine (PROAMATINE)  pantoprazole (PROTONIX)    May take these medicines IF NEEDED: albuterol (VENTOLIN HFA) inhaler  HYDROcodone-acetaminophen (NORCO)  meclizine (ANTIVERT)  nitroGLYCERIN (NITROSTAT) - please call [phone] if dose taken prior to surgery ondansetron (ZOFRAN ODT)  loperamide (IMODIUM A-D) - do not take more than one dose on day of  surgery    Follow your surgeon's/prescriber's instructions on when to stop warfarin (COUMADIN).  If no instructions were given by your surgeon then you will need to call the office to get those instructions.     One week prior to surgery, STOP taking any Aspirin (unless otherwise instructed by your surgeon) Aleve, Naproxen, Ibuprofen, Motrin, Advil, Goody's, BC's, all herbal medications, fish oil, and non-prescription vitamins. This includes your medication: diclofenac Sodium (VOLTAREN) GEL                      Do NOT Smoke (Tobacco/Vaping) for 24 hours prior to your procedure.  If you use a CPAP at night, you may bring your mask/headgear for your overnight stay.   You will be asked to remove any contacts, glasses, piercing's, hearing aid's, dentures/partials prior to surgery. Please bring cases for these items if needed.    Patients discharged the day of surgery will not be allowed to drive home, and someone needs to stay with them for 24 hours.  SURGICAL WAITING ROOM VISITATION Patients may have no more than 2 support people in the waiting area - these visitors may rotate.   Pre-op nurse will coordinate an appropriate time for 1 ADULT support person, who may not rotate, to accompany patient in pre-op.  Children under the age of 43 must have an adult with them who is not the patient and must remain in the main waiting area with an adult.  If the patient needs to stay at the hospital during part of their recovery, the  visitor guidelines for inpatient rooms apply.  Please refer to the [facility] website for the visitor guidelines for any additional information.   If you received a COVID test during your pre-op visit  it is requested that you wear a mask when out in public, stay away from anyone that may not be feeling well and notify your surgeon if you develop symptoms. If you have been in contact with anyone that has tested positive in the last 10 days please notify you surgeon.       Pre-operative 5 CHG Bathing Instructions   You can play a key role in reducing the risk of infection after surgery. Your skin needs to be as free of germs as possible. You can reduce the number of germs on your skin by washing with CHG (chlorhexidine gluconate) soap before surgery. CHG is an antiseptic soap that kills germs and continues to kill germs even after washing.   DO NOT use if you have an allergy to chlorhexidine/CHG or antibacterial soaps. If your skin becomes reddened or irritated, stop using the CHG and notify one of our RNs at [phone].   Please shower with the CHG soap starting 4 days before surgery using the following schedule:     Please keep in mind the following:  DO NOT shave, including legs and underarms, starting the day of your first shower.   You may shave your face at any point before/day of surgery.  Place clean sheets on your bed the day you start using CHG soap. Use a clean washcloth (not used since being washed) for each shower. DO NOT sleep with pets once you start using the CHG.   CHG Shower Instructions:  Wash your face and private area with normal soap. If you choose to wash your hair, wash first with your normal shampoo.  After you use shampoo/soap, rinse your hair and body thoroughly to remove shampoo/soap residue.  Turn the water OFF and apply about 3 tablespoons (45 ml) of CHG soap to a CLEAN washcloth.  Apply CHG soap ONLY FROM YOUR NECK DOWN TO YOUR TOES (washing for 3-5 minutes)  DO NOT use CHG soap on face, private areas, open wounds, or sores.  Pay special attention to the area where your surgery is being performed.  If you are having back surgery, having someone wash your back for you may be helpful. Wait 2 minutes after CHG soap is applied, then you may rinse off the CHG soap.  Pat dry with a clean towel  Put on clean clothes/pajamas   If you choose to wear lotion, please use ONLY the CHG-compatible lotions on the back of this  paper.   Additional instructions for the day of surgery: DO NOT APPLY any lotions, deodorants, cologne, or perfumes.   Do not bring valuables to the hospital. [facility] is not responsible for any belongings/valuables. Do not wear nail polish, gel polish, artificial nails, or any other type of covering on natural nails (fingers and toes) Do not wear jewelry or makeup Put on clean/comfortable clothes.  Please brush your teeth.  Ask your nurse before applying any prescription medications to the skin.     CHG Compatible Lotions   Aveeno Moisturizing lotion  Cetaphil Moisturizing Cream  Cetaphil Moisturizing Lotion  Clairol Herbal Essence Moisturizing Lotion, Dry Skin  Clairol Herbal Essence Moisturizing Lotion, Extra Dry Skin  Clairol Herbal Essence Moisturizing Lotion, Normal Skin  Curel Age Defying Therapeutic Moisturizing Lotion with Alpha Hydroxy  Curel Extreme Care Body Lotion  Curel Soothing Hands Moisturizing Hand Lotion  Curel Therapeutic Moisturizing Cream, Fragrance-Free  Curel Therapeutic Moisturizing Lotion, Fragrance-Free  Curel Therapeutic Moisturizing Lotion, Original Formula  Eucerin Daily Replenishing Lotion  Eucerin Dry Skin Therapy Plus Alpha Hydroxy Crme  Eucerin Dry Skin Therapy Plus Alpha Hydroxy Lotion  Eucerin Original Crme  Eucerin Original Lotion  Eucerin Plus Crme Eucerin Plus Lotion  Eucerin TriLipid Replenishing Lotion  Keri Anti-Bacterial Hand Lotion  Keri Deep Conditioning Original Lotion Dry Skin Formula Softly Scented  Keri Deep Conditioning Original Lotion, Fragrance Free Sensitive Skin Formula  Keri Lotion Fast Absorbing Fragrance Free Sensitive Skin Formula  Keri Lotion Fast Absorbing Softly Scented Dry Skin Formula  Keri Original Lotion  Keri Skin Renewal Lotion Keri Silky Smooth Lotion  Keri Silky Smooth Sensitive Skin Lotion  Nivea Body Creamy Conditioning Oil  Nivea Body Extra Enriched Lotion  Nivea Body Original Lotion  Nivea  Body Sheer Moisturizing Lotion Nivea Crme  Nivea Skin Firming Lotion  NutraDerm 30 Skin Lotion  NutraDerm Skin Lotion  NutraDerm Therapeutic Skin Cream  NutraDerm Therapeutic Skin Lotion  ProShield Protective Hand Cream  Provon moisturizing lotion   [place]- Preparing for Total Shoulder Arthroplasty  Before surgery, you can play an important role. Because skin is not sterile, your skin needs to be as free of germs as possible. You can reduce the number of germs on your skin by using the following products.   Benzoyl Peroxide Gel  o Reduces the number of germs present on the skin  o Applied twice a day to shoulder area starting two days before surgery   Chlorhexidine Gluconate (CHG) Soap (instructions listed above on how to wash with CHG Soap)  o An antiseptic cleaner that kills germs and bonds with the skin to continue killing germs even after washing  o Used for showering the night before surgery and morning of surgery   ==================================================================  Please follow these instructions carefully:  BENZOYL PEROXIDE 5% GEL  Please do not use if you have an allergy to benzoyl peroxide. If your skin becomes reddened/irritated stop using the benzoyl peroxide.  Starting two days before surgery, apply as follows:  1. Apply benzoyl peroxide in the morning and at night. Apply after taking a shower. If you are not taking a shower clean entire shoulder front, back, and side along with the armpit with a clean wet washcloth.  2. Place a quarter-sized dollop on your SHOULDER and rub in thoroughly, making sure to cover the front, back, and side of your shoulder, along with the armpit.   2 Days prior to Surgery First Dose on _____________ Morning Second Dose on ______________ Night  Day Before Surgery First Dose on ______________ Morning Night before surgery wash (entire body except face and private areas) with CHG Soap THEN Second Dose on  ____________ Night   Morning of Surgery  wash BODY AGAIN with CHG Soap   4. Do NOT apply benzoyl peroxide gel on the day of surgery     Please read over the following fact sheets that you were given.

## 2023-03-10 NOTE — Progress Notes (Addendum)
PCP - [name] Cardiologist - Dr. Algie Coffer  PPM/ICD - denies Device Orders - n/a Rep Notified - n/a  Chest x-ray - 05-24-22 EKG - 03-03-23 Stress Test - 01-16-15 ECHO - 02-24-22 Cardiac Cath - 03-16-16  Sleep Study - denies CPAP - n/a  Fasting Blood Sugar -  Between 90-100 per patient Checks Blood Sugar  every other day   Blood Thinner Instructions: warfarin (COUMADIN) Last dose 03-13-23 per patient  Aspirin Instructions: n/a  ERAS Protcol - PRE-SURGERY  G2-   COVID TEST- n/a   Anesthesia review:  Yes Hx, MI, stroke, CHF, DM, CAD, ESRD Dialysis Monday , Wednesday, Friday  Patient denies shortness of breath, fever, cough and chest pain at PAT appointment   All instructions explained to the patient, with a verbal understanding of the material. Patient agrees to go over the instructions while at home for a better understanding. Patient also instructed to self quarantine after being tested for COVID-19. The opportunity to ask questions was provided.

## 2023-03-11 DIAGNOSIS — N2581 Secondary hyperparathyroidism of renal origin: Secondary | ICD-10-CM | POA: Diagnosis not present

## 2023-03-11 DIAGNOSIS — Z992 Dependence on renal dialysis: Secondary | ICD-10-CM | POA: Diagnosis not present

## 2023-03-11 DIAGNOSIS — N186 End stage renal disease: Secondary | ICD-10-CM | POA: Diagnosis not present

## 2023-03-11 NOTE — Anesthesia Preprocedure Evaluation (Addendum)
Anesthesia Evaluation  Patient identified by MRN, date of birth, ID band Patient awake    Reviewed: Allergy & Precautions, H&P , NPO status , Patient's Chart, lab work & pertinent test results  Airway Mallampati: II  TM Distance: >3 FB Neck ROM: Full    Dental   Pulmonary neg pulmonary ROS   Pulmonary exam normal        Cardiovascular hypertension, + CAD, + Past MI and +CHF  + Valvular Problems/Murmurs MR  Rhythm:Regular Rate:Normal   1. Left ventricular ejection fraction, by estimation, is 35 to 40%. The  left ventricle has moderately decreased function. The left ventricle  demonstrates global hypokinesis. The left ventricular internal cavity size  was mildly dilated. Left ventricular  diastolic parameters are consistent with Grade I diastolic dysfunction  (impaired relaxation).   2. Right ventricular systolic function is low normal. The right  ventricular size is mildly enlarged.   3. Left atrial size was moderately dilated.   4. Right atrial size was mildly dilated.   5. The mitral valve is normal in structure. Mild to moderate mitral valve  regurgitation. Moderate mitral annular calcification.   6. The aortic valve is tricuspid. There is moderate calcification of the  aortic valve. There is mild thickening of the aortic valve. Aortic valve  regurgitation is trivial. Aortic valve sclerosis/calcification is present,  without any evidence of aortic  stenosis.   7. There is mild (Grade II) atheroma plaque involving the aortic root and  ascending aorta.   8. The inferior vena cava is normal in size with greater than 50%  respiratory variability, suggesting right atrial pressure of 3 mmHg.     Neuro/Psych CVA, No Residual Symptoms  negative psych ROS   GI/Hepatic Neg liver ROS,GERD  ,,  Endo/Other  diabetes    Renal/GU DialysisRenal disease  negative genitourinary   Musculoskeletal negative musculoskeletal ROS (+)     Abdominal   Peds negative pediatric ROS (+)  Hematology negative hematology ROS (+)   Anesthesia Other Findings   Reproductive/Obstetrics negative OB ROS                              Anesthesia Physical Anesthesia Plan  ASA: 4  Anesthesia Plan: General   Post-op Pain Management: Regional block*   Induction: Intravenous  PONV Risk Score and Plan: 2 and Ondansetron, Dexamethasone and Treatment may vary due to age or medical condition  Airway Management Planned: Oral ETT  Additional Equipment:   Intra-op Plan:   Post-operative Plan: Extubation in OR  Informed Consent: I have reviewed the patients History and Physical, chart, labs and discussed the procedure including the risks, benefits and alternatives for the proposed anesthesia with the patient or authorized representative who has indicated his/her understanding and acceptance.     Dental advisory given  Plan Discussed with: CRNA and Surgeon  Anesthesia Plan Comments: (Etomidate for induction   PAT note by Antionette Poles, PA-C: Follows with cardiologist Dr. Algie Coffer for history of nonobstructive CAD by cath 2017, HFrEF (EF 35 to 40% by echo 02/2022), mild to moderate mitral regurgitation, CVA 2012, DVT 2019, A-flutter on Coumadin and digoxin, hypotension on midodrine.  Patient underwent right reverse shoulder arthroplasty 09/20/2022 without complication.  He was cleared at that time by Dr. Algie Coffer stating the patient was moderate risk and optimized from cardiac standpoint.  Denies any changes since that time, states that Dr. Algie Coffer is aware of upcoming surgery as instructed him  to take his last dose of Coumadin 03/13/2023.   Patient was also seen and cleared by his PCP Marianne Sofia, FNP on 03/03/2023.   ESRD on HD Monday Wednesday Friday.   History of partial blindness.   CMP and CBC from 03/03/2023 reviewed, creatinine elevated consistent with 3 of ESRD, stable chronic anemia with hemoglobin  0.8, otherwise unremarkable.   Pt will need DOS labs due to ESRD.   EKG 03/03/2023: Sinus rhythm.  Rate 97.  Voltage criteria for LVH.  Does not appear significantly changed from prior.   TTE 02/24/2022: 1. Left ventricular ejection fraction, by estimation, is 35 to 40%. The  left ventricle has moderately decreased function. The left ventricle  demonstrates global hypokinesis. The left ventricular internal cavity size  was mildly dilated. Left ventricular  diastolic parameters are consistent with Grade I diastolic dysfunction  (impaired relaxation).   2. Right ventricular systolic function is low normal. The right  ventricular size is mildly enlarged.   3. Left atrial size was moderately dilated.   4. Right atrial size was mildly dilated.   5. The mitral valve is normal in structure. Mild to moderate mitral valve  regurgitation. Moderate mitral annular calcification.   6. The aortic valve is tricuspid. There is moderate calcification of the  aortic valve. There is mild thickening of the aortic valve. Aortic valve  regurgitation is trivial. Aortic valve sclerosis/calcification is present,  without any evidence of aortic  stenosis.   7. There is mild (Grade II) atheroma plaque involving the aortic root and  ascending aorta.   8. The inferior vena cava is normal in size with greater than 50%  respiratory variability, suggesting right atrial pressure of 3 mmHg.    Cath 03/16/2016:  Prox LAD lesion, 25 %stenosed.  There is moderate left ventricular systolic dysfunction.  LV end diastolic pressure is normal.  The left ventricular ejection fraction is 25-35% by visual estimate.  There is trivial (1+) mitral regurgitation.  )         Anesthesia Quick Evaluation

## 2023-03-11 NOTE — Progress Notes (Signed)
Anesthesia Chart Review:  Follows with cardiologist Dr. Algie Coffer for history of nonobstructive CAD by cath 2017, HFrEF (EF 35 to 40% by echo 02/2022), mild to moderate mitral regurgitation, CVA 2012, DVT 2019, A-flutter on Coumadin and digoxin, hypotension on midodrine.  Patient underwent right reverse shoulder arthroplasty 09/20/2022 without complication.  He was cleared at that time by Dr. Algie Coffer stating the patient was moderate risk and optimized from cardiac standpoint.  Denies any changes since that time, states that Dr. Algie Coffer is aware of upcoming surgery as instructed him to take his last dose of Coumadin 03/13/2023.   Patient was also seen and cleared by his PCP Marianne Sofia, FNP on 03/03/2023.   ESRD on HD Monday Wednesday Friday.   History of partial blindness.   CMP and CBC from 03/03/2023 reviewed, creatinine elevated consistent with 3 of ESRD, stable chronic anemia with hemoglobin 0.8, otherwise unremarkable.   Pt will need DOS labs due to ESRD.   EKG 03/03/2023: Sinus rhythm.  Rate 97.  Voltage criteria for LVH.  Does not appear significantly changed from prior.   TTE 02/24/2022: 1. Left ventricular ejection fraction, by estimation, is 35 to 40%. The  left ventricle has moderately decreased function. The left ventricle  demonstrates global hypokinesis. The left ventricular internal cavity size  was mildly dilated. Left ventricular  diastolic parameters are consistent with Grade I diastolic dysfunction  (impaired relaxation).   2. Right ventricular systolic function is low normal. The right  ventricular size is mildly enlarged.   3. Left atrial size was moderately dilated.   4. Right atrial size was mildly dilated.   5. The mitral valve is normal in structure. Mild to moderate mitral valve  regurgitation. Moderate mitral annular calcification.   6. The aortic valve is tricuspid. There is moderate calcification of the  aortic valve. There is mild thickening of the aortic  valve. Aortic valve  regurgitation is trivial. Aortic valve sclerosis/calcification is present,  without any evidence of aortic  stenosis.   7. There is mild (Grade II) atheroma plaque involving the aortic root and  ascending aorta.   8. The inferior vena cava is normal in size with greater than 50%  respiratory variability, suggesting right atrial pressure of 3 mmHg.    Cath 03/16/2016: Prox LAD lesion, 25 %stenosed. There is moderate left ventricular systolic dysfunction. LV end diastolic pressure is normal. The left ventricular ejection fraction is 25-35% by visual estimate. There is trivial (1+) mitral regurgitation.   [name] Short Stay Center/Anesthesiology Phone [phone] 03/11/2023 11:54 AM

## 2023-03-11 NOTE — Plan of Care (Signed)
[facility] Tonsillectomy/Adenoidectomy, Postoperative PEDS care plan entered in error.

## 2023-03-14 ENCOUNTER — Encounter: Payer: Self-pay | Admitting: Nurse Practitioner

## 2023-03-14 DIAGNOSIS — Z8673 Personal history of transient ischemic attack (TIA), and cerebral infarction without residual deficits: Secondary | ICD-10-CM | POA: Insufficient documentation

## 2023-03-14 DIAGNOSIS — G8929 Other chronic pain: Secondary | ICD-10-CM | POA: Insufficient documentation

## 2023-03-14 DIAGNOSIS — E1122 Type 2 diabetes mellitus with diabetic chronic kidney disease: Secondary | ICD-10-CM | POA: Insufficient documentation

## 2023-03-14 DIAGNOSIS — Z992 Dependence on renal dialysis: Secondary | ICD-10-CM | POA: Diagnosis not present

## 2023-03-14 DIAGNOSIS — Z01818 Encounter for other preprocedural examination: Secondary | ICD-10-CM | POA: Insufficient documentation

## 2023-03-14 DIAGNOSIS — I700 Atherosclerosis of aorta: Secondary | ICD-10-CM | POA: Insufficient documentation

## 2023-03-14 DIAGNOSIS — Z2821 Immunization not carried out because of patient refusal: Secondary | ICD-10-CM | POA: Insufficient documentation

## 2023-03-14 DIAGNOSIS — N186 End stage renal disease: Secondary | ICD-10-CM | POA: Diagnosis not present

## 2023-03-14 DIAGNOSIS — N2581 Secondary hyperparathyroidism of renal origin: Secondary | ICD-10-CM | POA: Diagnosis not present

## 2023-03-14 NOTE — Assessment & Plan Note (Addendum)
Pending his lab results will be cleared medically with moderate risk. EKG done with Nonspecific QRS widening.  -T-abnormality -Lateral ischemia. HR 97. He will need to hold his coumadin most likely but needs to get that instruction from Cardiology

## 2023-03-14 NOTE — Assessment & Plan Note (Signed)
Blood pressure is well controlled, continue current medications.

## 2023-03-14 NOTE — Assessment & Plan Note (Signed)
Hgba1c has improved and has been controlled. No current medications.

## 2023-03-14 NOTE — Assessment & Plan Note (Signed)
Awaiting surgery once cleared.

## 2023-03-14 NOTE — Assessment & Plan Note (Signed)
Cholesterol levels have been stable, continue statin. Tolerating well.

## 2023-03-14 NOTE — Assessment & Plan Note (Signed)
Continue statin. 

## 2023-03-14 NOTE — Assessment & Plan Note (Signed)
Declines shingrix, educated on disease process and is aware if he changes his mind to notify office  

## 2023-03-16 DIAGNOSIS — Z992 Dependence on renal dialysis: Secondary | ICD-10-CM | POA: Diagnosis not present

## 2023-03-16 DIAGNOSIS — N186 End stage renal disease: Secondary | ICD-10-CM | POA: Diagnosis not present

## 2023-03-16 DIAGNOSIS — N2581 Secondary hyperparathyroidism of renal origin: Secondary | ICD-10-CM | POA: Diagnosis not present

## 2023-03-18 ENCOUNTER — Other Ambulatory Visit: Payer: Self-pay

## 2023-03-18 ENCOUNTER — Observation Stay (HOSPITAL_COMMUNITY): Payer: Medicare PPO

## 2023-03-18 ENCOUNTER — Encounter (HOSPITAL_COMMUNITY)
Admission: RE | Disposition: A | Discharge status: Home / Self Care | Payer: Self-pay | Source: Home / Self Care | Attending: Orthopedic Surgery

## 2023-03-18 ENCOUNTER — Encounter (HOSPITAL_COMMUNITY): Payer: Self-pay | Admitting: Orthopedic Surgery

## 2023-03-18 ENCOUNTER — Ambulatory Visit (HOSPITAL_COMMUNITY): Payer: Medicare PPO | Admitting: Physician Assistant

## 2023-03-18 ENCOUNTER — Observation Stay (HOSPITAL_COMMUNITY)
Admission: RE | Admit: 2023-03-18 | Discharge: 2023-03-19 | Disposition: A | Discharge status: Home / Self Care | Payer: Medicare PPO | Attending: Orthopedic Surgery | Admitting: Orthopedic Surgery

## 2023-03-18 ENCOUNTER — Ambulatory Visit (HOSPITAL_COMMUNITY): Payer: Medicare PPO | Admitting: Anesthesiology

## 2023-03-18 DIAGNOSIS — I509 Heart failure, unspecified: Secondary | ICD-10-CM | POA: Insufficient documentation

## 2023-03-18 DIAGNOSIS — N186 End stage renal disease: Secondary | ICD-10-CM | POA: Insufficient documentation

## 2023-03-18 DIAGNOSIS — Z85528 Personal history of other malignant neoplasm of kidney: Secondary | ICD-10-CM | POA: Insufficient documentation

## 2023-03-18 DIAGNOSIS — I120 Hypertensive chronic kidney disease with stage 5 chronic kidney disease or end stage renal disease: Secondary | ICD-10-CM | POA: Diagnosis not present

## 2023-03-18 DIAGNOSIS — M19011 Primary osteoarthritis, right shoulder: Secondary | ICD-10-CM | POA: Diagnosis not present

## 2023-03-18 DIAGNOSIS — Z7901 Long term (current) use of anticoagulants: Secondary | ICD-10-CM | POA: Insufficient documentation

## 2023-03-18 DIAGNOSIS — J90 Pleural effusion, not elsewhere classified: Secondary | ICD-10-CM | POA: Diagnosis not present

## 2023-03-18 DIAGNOSIS — Z471 Aftercare following joint replacement surgery: Secondary | ICD-10-CM | POA: Diagnosis not present

## 2023-03-18 DIAGNOSIS — Z992 Dependence on renal dialysis: Secondary | ICD-10-CM | POA: Insufficient documentation

## 2023-03-18 DIAGNOSIS — E1122 Type 2 diabetes mellitus with diabetic chronic kidney disease: Secondary | ICD-10-CM | POA: Insufficient documentation

## 2023-03-18 DIAGNOSIS — I2510 Atherosclerotic heart disease of native coronary artery without angina pectoris: Secondary | ICD-10-CM

## 2023-03-18 DIAGNOSIS — Z86718 Personal history of other venous thrombosis and embolism: Secondary | ICD-10-CM | POA: Diagnosis not present

## 2023-03-18 DIAGNOSIS — I132 Hypertensive heart and chronic kidney disease with heart failure and with stage 5 chronic kidney disease, or end stage renal disease: Secondary | ICD-10-CM | POA: Diagnosis not present

## 2023-03-18 DIAGNOSIS — M19012 Primary osteoarthritis, left shoulder: Secondary | ICD-10-CM

## 2023-03-18 DIAGNOSIS — Z96611 Presence of right artificial shoulder joint: Secondary | ICD-10-CM | POA: Insufficient documentation

## 2023-03-18 DIAGNOSIS — Z8673 Personal history of transient ischemic attack (TIA), and cerebral infarction without residual deficits: Secondary | ICD-10-CM | POA: Diagnosis not present

## 2023-03-18 DIAGNOSIS — G8918 Other acute postprocedural pain: Secondary | ICD-10-CM | POA: Diagnosis not present

## 2023-03-18 DIAGNOSIS — Z79899 Other long term (current) drug therapy: Secondary | ICD-10-CM | POA: Diagnosis not present

## 2023-03-18 DIAGNOSIS — Z96612 Presence of left artificial shoulder joint: Principal | ICD-10-CM

## 2023-03-18 DIAGNOSIS — R918 Other nonspecific abnormal finding of lung field: Secondary | ICD-10-CM | POA: Diagnosis not present

## 2023-03-18 HISTORY — PX: REVERSE SHOULDER ARTHROPLASTY: SHX5054

## 2023-03-18 LAB — POCT I-STAT, CHEM 8
BUN: 37 mg/dL — ABNORMAL HIGH (ref 6–20)
Calcium, Ion: 1.15 mmol/L (ref 1.15–1.40)
Chloride: 97 mmol/L — ABNORMAL LOW (ref 98–111)
Creatinine, Ser: 11.1 mg/dL — ABNORMAL HIGH (ref 0.61–1.24)
Glucose, Bld: 82 mg/dL (ref 70–99)
HCT: 32 % — ABNORMAL LOW (ref 39.0–52.0)
Hemoglobin: 10.9 g/dL — ABNORMAL LOW (ref 13.0–17.0)
Potassium: 4.4 mmol/L (ref 3.5–5.1)
Sodium: 135 mmol/L (ref 135–145)
TCO2: 26 mmol/L (ref 22–32)

## 2023-03-18 LAB — PROTIME-INR
INR: 1.2 (ref 0.8–1.2)
Prothrombin Time: 14.9 s (ref 11.4–15.2)

## 2023-03-18 LAB — GLUCOSE, CAPILLARY
Glucose-Capillary: 43 mg/dL — CL (ref 70–99)
Glucose-Capillary: 58 mg/dL — ABNORMAL LOW (ref 70–99)
Glucose-Capillary: 87 mg/dL (ref 70–99)
Glucose-Capillary: 72 mg/dL (ref 70–99)
Glucose-Capillary: 153 mg/dL — ABNORMAL HIGH (ref 70–99)

## 2023-03-18 SURGERY — ARTHROPLASTY, SHOULDER, TOTAL, REVERSE
Anesthesia: General | Site: Shoulder | Laterality: Left

## 2023-03-18 MED ORDER — METOPROLOL SUCCINATE ER 25 MG PO TB24
25.0000 mg | ORAL_TABLET | Freq: Every day | ORAL | Status: DC
  Administered 2023-03-18: 25 mg via ORAL
  Filled 2023-03-18: qty 1

## 2023-03-18 MED ORDER — ONDANSETRON HCL 4 MG/2ML IJ SOLN
INTRAMUSCULAR | Status: AC
Start: 2023-03-18 — End: ?
  Filled 2023-03-18: qty 2

## 2023-03-18 MED ORDER — FENTANYL CITRATE (PF) 250 MCG/5ML IJ SOLN
INTRAMUSCULAR | Status: DC | PRN
  Administered 2023-03-18: 50 ug via INTRAVENOUS

## 2023-03-18 MED ORDER — MENTHOL 3 MG MT LOZG: 1.0000 | LOZENGE | OROMUCOSAL | Status: DC | PRN

## 2023-03-18 MED ORDER — INSULIN ASPART 100 UNIT/ML IJ SOLN: 0.0000 [IU] | Freq: Every day | INTRAMUSCULAR | Status: DC

## 2023-03-18 MED ORDER — MIDAZOLAM HCL 2 MG/2ML IJ SOLN
INTRAMUSCULAR | Status: AC
  Filled 2023-03-18: qty 2

## 2023-03-18 MED ORDER — CHLORHEXIDINE GLUCONATE CLOTH 2 % EX PADS
6.0000 | MEDICATED_PAD | Freq: Every day | CUTANEOUS | Status: DC
  Administered 2023-03-19: 6 via TOPICAL

## 2023-03-18 MED ORDER — ALBUTEROL SULFATE HFA 108 (90 BASE) MCG/ACT IN AERS: 2.0000 | INHALATION_SPRAY | Freq: Four times a day (QID) | RESPIRATORY_TRACT | Status: DC | PRN

## 2023-03-18 MED ORDER — ONDANSETRON HCL 4 MG/2ML IJ SOLN: 4.0000 mg | Freq: Four times a day (QID) | INTRAMUSCULAR | Status: DC | PRN

## 2023-03-18 MED ORDER — VANCOMYCIN HCL 1000 MG IV SOLR
INTRAVENOUS | Status: AC
  Filled 2023-03-18: qty 20

## 2023-03-18 MED ORDER — ONDANSETRON HCL 4 MG/2ML IJ SOLN
INTRAMUSCULAR | Status: DC | PRN
  Administered 2023-03-18: 4 mg via INTRAVENOUS

## 2023-03-18 MED ORDER — DEXAMETHASONE SODIUM PHOSPHATE 10 MG/ML IJ SOLN
INTRAMUSCULAR | Status: DC | PRN
  Administered 2023-03-18: 5 mg via INTRAVENOUS

## 2023-03-18 MED ORDER — SODIUM CHLORIDE 0.9% FLUSH
10.0000 mL | Freq: Two times a day (BID) | INTRAVENOUS | Status: DC
  Administered 2023-03-19: 10 mL

## 2023-03-18 MED ORDER — CEFAZOLIN SODIUM-DEXTROSE 1-4 GM/50ML-% IV SOLN: 1.0000 g | Freq: Four times a day (QID) | INTRAVENOUS | Status: DC

## 2023-03-18 MED ORDER — VASOPRESSIN 20 UNIT/ML IV SOLN
INTRAVENOUS | Status: DC | PRN
Start: 2023-03-18 — End: 2023-03-18
  Administered 2023-03-18 (×3): 1 [IU] via INTRAVENOUS

## 2023-03-18 MED ORDER — ONDANSETRON HCL 4 MG PO TABS: 4.0000 mg | ORAL_TABLET | Freq: Four times a day (QID) | ORAL | Status: DC | PRN

## 2023-03-18 MED ORDER — DEXTROSE 50 % IV SOLN
INTRAVENOUS | Status: AC
  Filled 2023-03-18: qty 50

## 2023-03-18 MED ORDER — METOCLOPRAMIDE HCL 5 MG PO TABS: 5.0000 mg | ORAL_TABLET | Freq: Three times a day (TID) | ORAL | Status: DC | PRN

## 2023-03-18 MED ORDER — FENTANYL CITRATE (PF) 250 MCG/5ML IJ SOLN
INTRAMUSCULAR | Status: AC
  Filled 2023-03-18: qty 5

## 2023-03-18 MED ORDER — LIDOCAINE 2% (20 MG/ML) 5 ML SYRINGE
INTRAMUSCULAR | Status: AC
  Filled 2023-03-18: qty 5

## 2023-03-18 MED ORDER — METOCLOPRAMIDE HCL 5 MG/ML IJ SOLN: 5.0000 mg | Freq: Three times a day (TID) | INTRAMUSCULAR | Status: DC | PRN

## 2023-03-18 MED ORDER — KETAMINE HCL 50 MG/5ML IJ SOSY
PREFILLED_SYRINGE | INTRAMUSCULAR | Status: AC
  Filled 2023-03-18: qty 5

## 2023-03-18 MED ORDER — DEXAMETHASONE SODIUM PHOSPHATE 10 MG/ML IJ SOLN
INTRAMUSCULAR | Status: AC
  Filled 2023-03-18: qty 1

## 2023-03-18 MED ORDER — ONDANSETRON HCL 4 MG/2ML IJ SOLN
INTRAMUSCULAR | Status: AC
  Filled 2023-03-18: qty 2

## 2023-03-18 MED ORDER — ACETAMINOPHEN 325 MG PO TABS: 325.0000 mg | ORAL_TABLET | Freq: Four times a day (QID) | ORAL | Status: DC | PRN

## 2023-03-18 MED ORDER — PHENYLEPHRINE HCL-NACL 20-0.9 MG/250ML-% IV SOLN
INTRAVENOUS | Status: DC | PRN
  Administered 2023-03-18 (×3): 160 ug via INTRAVENOUS
  Administered 2023-03-18: 50 ug/min via INTRAVENOUS

## 2023-03-18 MED ORDER — SUCROFERRIC OXYHYDROXIDE 500 MG PO CHEW: 500.0000 mg | CHEWABLE_TABLET | ORAL | Status: DC | PRN

## 2023-03-18 MED ORDER — DIGOXIN 125 MCG PO TABS
0.1250 mg | ORAL_TABLET | ORAL | Status: DC
  Administered 2023-03-18: 0.125 mg via ORAL
  Filled 2023-03-18: qty 1

## 2023-03-18 MED ORDER — PHENOL 1.4 % MT LIQD: 1.0000 | OROMUCOSAL | Status: DC | PRN

## 2023-03-18 MED ORDER — MELATONIN 5 MG PO TABS
5.0000 mg | ORAL_TABLET | Freq: Every evening | ORAL | Status: DC | PRN
  Administered 2023-03-18: 5 mg via ORAL
  Filled 2023-03-18: qty 1

## 2023-03-18 MED ORDER — SODIUM CHLORIDE 0.9 % IV SOLN: INTRAVENOUS | Status: DC

## 2023-03-18 MED ORDER — ISOSORBIDE DINITRATE 10 MG PO TABS
10.0000 mg | ORAL_TABLET | Freq: Two times a day (BID) | ORAL | Status: DC
  Administered 2023-03-18: 10 mg via ORAL
  Filled 2023-03-18 (×4): qty 1

## 2023-03-18 MED ORDER — CALCIUM ACETATE (PHOS BINDER) 667 MG PO CAPS
2001.0000 mg | ORAL_CAPSULE | Freq: Three times a day (TID) | ORAL | Status: DC
  Administered 2023-03-19: 2001 mg via ORAL
  Filled 2023-03-18 (×5): qty 3

## 2023-03-18 MED ORDER — PROPOFOL 10 MG/ML IV BOLUS
INTRAVENOUS | Status: AC
  Filled 2023-03-18: qty 20

## 2023-03-18 MED ORDER — ORAL CARE MOUTH RINSE: 15.0000 mL | Freq: Once | OROMUCOSAL | Status: AC

## 2023-03-18 MED ORDER — CALCIUM ACETATE (PHOS BINDER) 667 MG PO CAPS: 667.0000 mg | ORAL_CAPSULE | ORAL | Status: DC | PRN

## 2023-03-18 MED ORDER — LOPERAMIDE HCL 2 MG PO CAPS: 2.0000 mg | ORAL_CAPSULE | Freq: Four times a day (QID) | ORAL | Status: DC | PRN

## 2023-03-18 MED ORDER — RISAQUAD PO CAPS
2.0000 | ORAL_CAPSULE | Freq: Every day | ORAL | Status: DC
  Administered 2023-03-18 – 2023-03-19 (×2): 2 via ORAL
  Filled 2023-03-18 (×2): qty 2

## 2023-03-18 MED ORDER — PANTOPRAZOLE SODIUM 40 MG PO TBEC
40.0000 mg | DELAYED_RELEASE_TABLET | Freq: Two times a day (BID) | ORAL | Status: DC
  Administered 2023-03-18 – 2023-03-19 (×2): 40 mg via ORAL
  Filled 2023-03-18 (×2): qty 1

## 2023-03-18 MED ORDER — DOCUSATE SODIUM 100 MG PO CAPS
100.0000 mg | ORAL_CAPSULE | Freq: Two times a day (BID) | ORAL | Status: DC
Start: 2023-03-18 — End: 2023-03-20
  Administered 2023-03-19: 100 mg via ORAL
  Filled 2023-03-18 (×2): qty 1

## 2023-03-18 MED ORDER — MECLIZINE HCL 25 MG PO TABS: 25.0000 mg | ORAL_TABLET | Freq: Three times a day (TID) | ORAL | Status: DC | PRN

## 2023-03-18 MED ORDER — VASOPRESSIN 20 UNIT/ML IV SOLN
INTRAVENOUS | Status: AC
  Filled 2023-03-18: qty 1

## 2023-03-18 MED ORDER — SUCROFERRIC OXYHYDROXIDE 500 MG PO CHEW
1000.0000 mg | CHEWABLE_TABLET | Freq: Three times a day (TID) | ORAL | Status: DC
  Filled 2023-03-18: qty 2

## 2023-03-18 MED ORDER — TRANEXAMIC ACID-NACL 1000-0.7 MG/100ML-% IV SOLN
1000.0000 mg | INTRAVENOUS | Status: AC
  Administered 2023-03-18: 1000 mg via INTRAVENOUS
  Filled 2023-03-18: qty 100

## 2023-03-18 MED ORDER — CALCIUM ACETATE (PHOS BINDER) 667 MG PO CAPS: 667.0000 mg | ORAL_CAPSULE | ORAL | Status: DC

## 2023-03-18 MED ORDER — HYDROMORPHONE HCL 1 MG/ML IJ SOLN: 0.5000 mg | INTRAMUSCULAR | Status: DC | PRN

## 2023-03-18 MED ORDER — DEXAMETHASONE SODIUM PHOSPHATE 10 MG/ML IJ SOLN
INTRAMUSCULAR | Status: AC
Start: 2023-03-18 — End: ?
  Filled 2023-03-18: qty 1

## 2023-03-18 MED ORDER — ROCURONIUM BROMIDE 10 MG/ML (PF) SYRINGE
PREFILLED_SYRINGE | INTRAVENOUS | Status: DC | PRN
  Administered 2023-03-18: 50 mg via INTRAVENOUS
  Administered 2023-03-18: 10 mg via INTRAVENOUS

## 2023-03-18 MED ORDER — VANCOMYCIN HCL 1 G IV SOLR
INTRAVENOUS | Status: DC | PRN
  Administered 2023-03-18: 1000 mg via TOPICAL

## 2023-03-18 MED ORDER — GABAPENTIN 300 MG PO CAPS
300.0000 mg | ORAL_CAPSULE | Freq: Every day | ORAL | Status: DC
  Administered 2023-03-18: 300 mg via ORAL
  Filled 2023-03-18: qty 1

## 2023-03-18 MED ORDER — INSULIN ASPART 100 UNIT/ML IJ SOLN
0.0000 [IU] | Freq: Three times a day (TID) | INTRAMUSCULAR | Status: DC
Start: 2023-03-19 — End: 2023-03-18

## 2023-03-18 MED ORDER — CEFAZOLIN SODIUM-DEXTROSE 2-3 GM-%(50ML) IV SOLR
INTRAVENOUS | Status: DC | PRN
  Administered 2023-03-18: 2 g via INTRAVENOUS

## 2023-03-18 MED ORDER — CHLORHEXIDINE GLUCONATE 0.12 % MT SOLN
15.0000 mL | Freq: Once | OROMUCOSAL | Status: AC
Start: 2023-03-18 — End: 2023-03-18
  Administered 2023-03-18: 15 mL via OROMUCOSAL
  Filled 2023-03-18: qty 15

## 2023-03-18 MED ORDER — ROCURONIUM BROMIDE 10 MG/ML (PF) SYRINGE
PREFILLED_SYRINGE | INTRAVENOUS | Status: AC
  Filled 2023-03-18: qty 10

## 2023-03-18 MED ORDER — SODIUM CHLORIDE 0.9% FLUSH: 10.0000 mL | INTRAVENOUS | Status: DC | PRN

## 2023-03-18 MED ORDER — TRANEXAMIC ACID-NACL 1000-0.7 MG/100ML-% IV SOLN
1000.0000 mg | Freq: Once | INTRAVENOUS | Status: AC
  Administered 2023-03-18: 1000 mg via INTRAVENOUS
  Filled 2023-03-18: qty 100

## 2023-03-18 MED ORDER — ACIDOPHILUS/BIFIDUS PO WAFR: 1.0000 | WAFER | Freq: Every day | ORAL | Status: DC

## 2023-03-18 MED ORDER — DEXTROSE 50 % IV SOLN: 12.5000 g | INTRAVENOUS | Status: AC

## 2023-03-18 MED ORDER — CHLORHEXIDINE GLUCONATE 0.12 % MT SOLN: 15.0000 mL | Freq: Once | OROMUCOSAL | Status: DC

## 2023-03-18 MED ORDER — CEFAZOLIN SODIUM-DEXTROSE 2-4 GM/100ML-% IV SOLN
2.0000 g | INTRAVENOUS | Status: DC
  Filled 2023-03-18: qty 100

## 2023-03-18 MED ORDER — OXYCODONE HCL 5 MG PO TABS
10.0000 mg | ORAL_TABLET | ORAL | Status: DC | PRN
  Administered 2023-03-18: 15 mg via ORAL
  Administered 2023-03-19: 10 mg via ORAL
  Filled 2023-03-18: qty 3

## 2023-03-18 MED ORDER — 0.9 % SODIUM CHLORIDE (POUR BTL) OPTIME
TOPICAL | Status: DC | PRN
  Administered 2023-03-18: 1000 mL

## 2023-03-18 MED ORDER — SUGAMMADEX SODIUM 200 MG/2ML IV SOLN
INTRAVENOUS | Status: DC | PRN
  Administered 2023-03-18 (×2): 100 mg via INTRAVENOUS

## 2023-03-18 MED ORDER — DEXTROSE 50 % IV SOLN: 1.0000 | INTRAVENOUS | Status: DC | PRN

## 2023-03-18 MED ORDER — ORAL CARE MOUTH RINSE: 15.0000 mL | Freq: Once | OROMUCOSAL | Status: DC

## 2023-03-18 MED ORDER — NITROGLYCERIN 0.4 MG SL SUBL: 0.4000 mg | SUBLINGUAL_TABLET | SUBLINGUAL | Status: DC | PRN

## 2023-03-18 MED ORDER — CEFAZOLIN SODIUM-DEXTROSE 1-4 GM/50ML-% IV SOLN
1.0000 g | Freq: Once | INTRAVENOUS | Status: AC
  Administered 2023-03-19: 1 g via INTRAVENOUS
  Filled 2023-03-18: qty 50

## 2023-03-18 MED ORDER — ETOMIDATE 2 MG/ML IV SOLN
INTRAVENOUS | Status: DC | PRN
  Administered 2023-03-18: 16 mg via INTRAVENOUS

## 2023-03-18 MED ORDER — BUPIVACAINE LIPOSOME 1.3 % IJ SUSP
INTRAMUSCULAR | Status: DC | PRN
  Administered 2023-03-18: 10 mL via PERINEURAL

## 2023-03-18 MED ORDER — ACETAMINOPHEN 500 MG PO TABS
1000.0000 mg | ORAL_TABLET | Freq: Four times a day (QID) | ORAL | Status: AC
  Administered 2023-03-18 – 2023-03-19 (×3): 1000 mg via ORAL
  Filled 2023-03-18 (×3): qty 2

## 2023-03-18 MED ORDER — SUCROFERRIC OXYHYDROXIDE 500 MG PO CHEW: 500.0000 mg | CHEWABLE_TABLET | ORAL | Status: DC

## 2023-03-18 MED ORDER — BUPIVACAINE HCL (PF) 0.5 % IJ SOLN
INTRAMUSCULAR | Status: DC | PRN
  Administered 2023-03-18: 15 mL via PERINEURAL

## 2023-03-18 MED ORDER — ALBUMIN HUMAN 5 % IV SOLN: INTRAVENOUS | Status: DC | PRN

## 2023-03-18 MED ORDER — MIDODRINE HCL 5 MG PO TABS
10.0000 mg | ORAL_TABLET | Freq: Three times a day (TID) | ORAL | Status: DC
  Administered 2023-03-18 – 2023-03-19 (×3): 10 mg via ORAL
  Filled 2023-03-18 (×3): qty 2

## 2023-03-18 MED ORDER — MIDAZOLAM HCL 2 MG/2ML IJ SOLN
INTRAMUSCULAR | Status: DC | PRN
  Administered 2023-03-18 (×2): 1 mg via INTRAVENOUS

## 2023-03-18 MED ORDER — OXYCODONE HCL 5 MG PO TABS
5.0000 mg | ORAL_TABLET | ORAL | Status: DC | PRN
  Administered 2023-03-19: 10 mg via ORAL
  Filled 2023-03-18 (×2): qty 2

## 2023-03-18 SURGICAL SUPPLY — 80 items
AID PSTN UNV HD RSTRNT DISP (MISCELLANEOUS) ×1
BAG COUNTER SPONGE SURGICOUNT (BAG) ×1 IMPLANT
BAG SPNG CNTER NS LX DISP (BAG) ×1
BEARING HUMERAL SHLDER 36M STD (Shoulder) IMPLANT
BIT DRILL 2.7 W/STOP DISP (BIT) IMPLANT
BIT DRILL 5/64X5 DISP (BIT) ×1 IMPLANT
BIT DRILL TWIST 2.7 (BIT) IMPLANT
BLADE SAG 18X100X1.27 (BLADE) ×1 IMPLANT
BRNG HUM STD 36 RVRS SHLDR (Shoulder) ×1 IMPLANT
BSPLAT GLND LRG AUG TPR ADPR (Joint) ×1 IMPLANT
CEMENT HV SMART SET (Cement) IMPLANT
COMP REV AUG LG W/TAPER/GLENOI (Joint) ×1 IMPLANT
COMPONENT RV AUG LG W/TAPR/GLN (Joint) IMPLANT
COVER SURGICAL LIGHT HANDLE (MISCELLANEOUS) ×1 IMPLANT
DRAPE IMP U-DRAPE 54X76 (DRAPES) ×2 IMPLANT
DRAPE INCISE IOBAN 66X45 STRL (DRAPES) ×1 IMPLANT
DRAPE ORTHO SPLIT 77X108 STRL (DRAPES) ×2
DRAPE SURG 17X23 STRL (DRAPES) ×1 IMPLANT
DRAPE SURG ORHT 6 SPLT 77X108 (DRAPES) ×2 IMPLANT
DRAPE U-SHAPE 47X51 STRL (DRAPES) ×1 IMPLANT
DRESSING AQUACEL AG SP 3.5X6 (GAUZE/BANDAGES/DRESSINGS) ×1 IMPLANT
DRSG AQUACEL AG ADV 3.5X10 (GAUZE/BANDAGES/DRESSINGS) ×1 IMPLANT
DRSG AQUACEL AG SP 3.5X6 (GAUZE/BANDAGES/DRESSINGS) ×1
DURAPREP 26ML APPLICATOR (WOUND CARE) ×1 IMPLANT
ELECT BLADE 4.0 EZ CLEAN MEGAD (MISCELLANEOUS) ×1
ELECT REM PT RETURN 9FT ADLT (ELECTROSURGICAL) ×1
ELECTRODE BLDE 4.0 EZ CLN MEGD (MISCELLANEOUS) ×1 IMPLANT
ELECTRODE REM PT RTRN 9FT ADLT (ELECTROSURGICAL) ×1 IMPLANT
FACESHIELD WRAPAROUND (MASK) ×1 IMPLANT
FACESHIELD WRAPAROUND OR TEAM (MASK) ×1 IMPLANT
GLENOID SPHERE 36MM CVD +3 (Orthopedic Implant) IMPLANT
GLOVE BIO SURGEON STRL SZ7.5 (GLOVE) ×2 IMPLANT
GLOVE BIOGEL PI IND STRL 8 (GLOVE) ×2 IMPLANT
GOWN STRL REUS W/ TWL LRG LVL3 (GOWN DISPOSABLE) ×1 IMPLANT
GOWN STRL REUS W/ TWL XL LVL3 (GOWN DISPOSABLE) ×2 IMPLANT
GOWN STRL REUS W/TWL LRG LVL3 (GOWN DISPOSABLE) ×1
GOWN STRL REUS W/TWL XL LVL3 (GOWN DISPOSABLE) ×2
GUIDE RSA SHLD BM ROT L SU (ORTHOPEDIC DISPOSABLE SUPPLIES) IMPLANT
KIT BASIN OR (CUSTOM PROCEDURE TRAY) ×1 IMPLANT
KIT TURNOVER KIT B (KITS) ×1 IMPLANT
MANIFOLD NEPTUNE II (INSTRUMENTS) ×1 IMPLANT
NDL 1/2 CIR MAYO (NEEDLE) ×1 IMPLANT
NDL HYPO 25GX1X1/2 BEV (NEEDLE) ×1 IMPLANT
NEEDLE 1/2 CIR MAYO (NEEDLE) ×1 IMPLANT
NEEDLE HYPO 25GX1X1/2 BEV (NEEDLE) ×1 IMPLANT
NS IRRIG 1000ML POUR BTL (IV SOLUTION) ×1 IMPLANT
PACK SHOULDER (CUSTOM PROCEDURE TRAY) ×1 IMPLANT
PAD ARMBOARD 7.5X6 YLW CONV (MISCELLANEOUS) ×2 IMPLANT
PIN THREADED REVERSE (PIN) IMPLANT
REAMER GUIDE BUSHING SURG DISP (MISCELLANEOUS) IMPLANT
REAMER GUIDE W/SCREW AUG (MISCELLANEOUS) IMPLANT
RESTRAINT HEAD UNIVERSAL NS (MISCELLANEOUS) ×1 IMPLANT
SCREW BONE LOCKING 4.75X35X3.5 (Screw) IMPLANT
SCREW CENTRAL 6.5X20MM (Screw) IMPLANT
SCREW LOCKING 4.75MMX15MM (Screw) IMPLANT
SHOULDER HUMERAL BEAR 36M STD (Shoulder) ×1 IMPLANT
SLING ARM IMMOBILIZER LRG (SOFTGOODS) IMPLANT
SLING ARM IMMOBILIZER MED (SOFTGOODS) IMPLANT
SPONGE T-LAP 18X18 ~~LOC~~+RFID (SPONGE) IMPLANT
SPONGE T-LAP 4X18 ~~LOC~~+RFID (SPONGE) ×1 IMPLANT
STEM HUM IDENT STD SHLD 13 (Stem) IMPLANT
STRIP CLOSURE SKIN 1/2X4 (GAUZE/BANDAGES/DRESSINGS) ×1 IMPLANT
SUCTION TUBE FRAZIER 10FR DISP (SUCTIONS) ×1 IMPLANT
SUT FIBERWIRE #2 38 T-5 BLUE (SUTURE) ×1
SUT MAXBRAID (SUTURE) IMPLANT
SUT MNCRL AB 3-0 PS2 18 (SUTURE) ×1 IMPLANT
SUT VIC AB 0 CT1 27 (SUTURE) ×1
SUT VIC AB 0 CT1 27XBRD ANBCTR (SUTURE) IMPLANT
SUT VIC AB 1 CT1 27 (SUTURE)
SUT VIC AB 1 CT1 27XBRD ANBCTR (SUTURE) IMPLANT
SUT VIC AB 2-0 CT1 27 (SUTURE) ×1
SUT VIC AB 2-0 CT1 TAPERPNT 27 (SUTURE) ×1 IMPLANT
SUTURE FIBERWR #2 38 T-5 BLUE (SUTURE) ×1 IMPLANT
SYR CONTROL 10ML LL (SYRINGE) ×1 IMPLANT
TOWEL GREEN STERILE (TOWEL DISPOSABLE) ×1 IMPLANT
TOWEL GREEN STERILE FF (TOWEL DISPOSABLE) ×1 IMPLANT
TOWER CARTRIDGE SMART MIX (DISPOSABLE) IMPLANT
TRAY HUMERAL MICRO SZ10 (Shoulder) IMPLANT
WATER STERILE IRR 1000ML POUR (IV SOLUTION) ×1 IMPLANT
YANKAUER SUCT BULB TIP NO VENT (SUCTIONS) ×1 IMPLANT

## 2023-03-18 NOTE — Anesthesia Procedure Notes (Signed)
Anesthesia Regional Block: Interscalene brachial plexus block   Pre-Anesthetic Checklist: , timeout performed,  Correct Patient, Correct Site, Correct Laterality,  Correct Procedure, Correct Position, site marked,  Risks and benefits discussed,  Surgical consent,  Pre-op evaluation,  At surgeon's request and post-op pain management  Laterality: Left  Prep: chloraprep       Needles:  Injection technique: Single-shot  Needle Type: Echogenic Needle     Needle Length: 9cm      Additional Needles:   Procedures:,,,, ultrasound used (permanent image in chart),,    Narrative:  Start time: 03/18/2023 2:05 PM End time: 03/18/2023 2:12 PM Injection made incrementally with aspirations every 5 mL.  Performed by: Personally  Anesthesiologist: Eilene Ghazi, MD  Additional Notes: Patient tolerated the procedure well without complications

## 2023-03-18 NOTE — Progress Notes (Signed)
CBG on Istat was 82. Per Dr. Okey Dupre, holding the Glucose.  Will continue to monitor.

## 2023-03-18 NOTE — Discharge Instructions (Signed)
Orthopedic surgery discharge instructions:  -Maintain postoperative bandage until follow-up appointment.  This is waterproof, and you may begin showering on postoperative day #3.  Do not submerge underwater.  Maintain that bandage until your follow-up appointment in 2 weeks.  -No lifting over 2 pounds with operateive arm.  You may use the arm immediately for activities of daily living such as bathing, washing your face and brushing your teeth, eating, and getting dressed.  Otherwise maintain your sling when you are out of the house and sleeping.  -Apply ice liberally to the shoulder throughout the day.  For mild to moderate pain use Tylenol and Advil as needed around-the-clock.  For breakthrough pain use oxycodone as necessary.  -You will return to see Dr. Ashely Joshua in the office in 2 weeks for routine postoperative check with x-rays.  

## 2023-03-18 NOTE — Progress Notes (Signed)
IV team consult to check established central line.  CXR done at 2020 noted but has not been read.  Spoke with Mahima, RN regarding the above.  She stated that the patient's IV is no longer working.  I informed her that I would call radiology and see if I can have them prioritize him since he now has no access other than the line.  Call made to radiology reading room and was told that they would assign the image to a radiologist to read next.

## 2023-03-18 NOTE — Consult Note (Signed)
Initial Consultation Note   Patient: [name] GNF:[id number] DOB: [birth year] PCP: [name], FNP DOA: 03/18/2023 DOS: the patient was seen and examined on 03/18/2023 Primary service: Yolonda Kida, MD  Referring physician: Dr. Shon Baton Reason for consult: Hypoglycemia  Assessment and Plan: Hypoglycemia: Patient noted to have low fingerstick glucose readings of 43 and 58 earlier today.  Appears these were prior to his surgery.  Patient has a prior history of diabetes but not on medical management anymore since starting dialysis.  Hypoglycemic episodes were likely due to fasting state. -Discontinue current sliding scale insulin orders -Continue hypoglycemia protocol as ordered -Patient has diet order, encouraged him to continue oral nutrition -CBG check q4h x 3 -D50 amp as needed for hypoglycemia  S/p left reverse shoulder arthroplasty 03/18/23: Postop care as per orthopedics.  ESRD on MWF HD: Nephrology following, plan is for dialysis tomorrow.  Chronic systolic CHF: Volume managed with dialysis.  Continue Toprol-XL, digoxin.  Chronic hypotension: BP stable, continue midodrine.  Paroxysmal A-fib/flutter: Stable.  Continue Toprol-XL, digoxin.  Resume Coumadin when cleared from surgical perspective.  TRH will continue to follow the patient.  HPI:  [name] is a 62 y.o. male with medical history significant for ESRD on MWF HD, paroxysmal A-fib/flutter on Coumadin, chronic systolic CHF (EF 84-69%), chronic hypotension on midodrine, Hx of DVT, right nephrectomy who was admitted by the orthopedic service after undergoing left reverse shoulder arthroplasty.  Patient seen postoperatively on the floor.  He is fatigued but awake and answering questions appropriately.  He reports a history of diabetes but has not required medical management since he has been on dialysis.  He states that he did not feel the hypoglycemic episodes earlier today.  He says he has not had any  nausea, vomiting, abdominal pain.  He says he no longer makes urine.  Review of Systems: As mentioned in the history of present illness. All other systems reviewed and are negative. 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 Algie Coffer follows   Diabetes mellitus    pt. states he's borderline diabetic., no longer taking med,- off med. since 2013   Dialysis patient [facility])    Mon-Wed-Fri([facility])- Left AV fistula   Diverticulitis 03/2015   reoccurred in December 2016   Diverticulitis of intestine without perforation or abscess without bleeding 04/21/2015   Dizziness 11/22/2018   DVT (deep venous thrombosis) (HCC)    ESRD (end stage renal disease) (HCC)    GERD (gastroesophageal reflux disease)    Gout    Hemorrhage of left kidney 01/13/2018   History of nephrectomy 07/04/2012   History of right nephrectomy in 2002 for renal cell carcinoma    History of unilateral nephrectomy    Hypertension    Hypotension due to hypovolemia 06/04/2020   Low iron    Myocardial infarction [facility]) ?2006   Renal cell carcinoma    dialysis- MWF- [address]- Dr. Briant Cedar follows.   Renal insufficiency    Shortness of breath 05/19/2011   "at rest, lying down, w/exertion"  Stroke [facility]) 02/2011   05/19/11 denies residual   Umbilical hernia 05/19/2011   unrepaired   Wears glasses    Past Surgical History:  Procedure Laterality Date   ANAL FISTULOTOMY  08/15/2018   AV FISTULA PLACEMENT  03/29/2011   Procedure: ARTERIOVENOUS (AV) FISTULA CREATION;  Surgeon: Nilda Simmer, MD;   Location: [facility] OR;  Service: Vascular;  Laterality: Left;  LEFT RADIOCEPHALIC , Arteriovenous [phone])   CARDIAC CATHETERIZATION  05/21/11   CARDIAC CATHETERIZATION N/A 03/16/2016   Procedure: Left Heart Cath and Coronary Angiography;  Surgeon: Orpah Cobb, MD;  Location: [facility] INVASIVE CV LAB;  Service: Cardiovascular;  Laterality: N/A;   CHOLECYSTECTOMY N/A 02/12/2016   Procedure: LAPAROSCOPIC CHOLECYSTECTOMY WITH INTRAOPERATIVE CHOLANGIOGRAM;  Surgeon: Avel Peace, MD;  Location: [facility] OR;  Service: General;  Laterality: N/A;   COLONOSCOPY WITH PROPOFOL N/A 02/01/2017   Procedure: COLONOSCOPY WITH PROPOFOL;  Surgeon: Charna Elizabeth, MD;  Location: [facility] ENDOSCOPY;  Service: Endoscopy;  Laterality: N/A;   dialysis cath placed     ESOPHAGOGASTRODUODENOSCOPY (EGD) WITH PROPOFOL N/A 12/02/2015   Procedure: ESOPHAGOGASTRODUODENOSCOPY (EGD) WITH PROPOFOL;  Surgeon: Charna Elizabeth, MD;  Location: [facility] ENDOSCOPY;  Service: Endoscopy;  Laterality: N/A;   EVALUATION UNDER ANESTHESIA WITH HEMORRHOIDECTOMY N/A 08/15/2018   Procedure: HEMORRHOID LIGATION/PEXY;  Surgeon: Karie Soda, MD;  Location: [facility] OR;  Service: General;  Laterality: N/A;   FINGER SURGERY     L pinkie finger- ORIF- /w remaining hardware - 1990's     FISTULOTOMY N/A 08/15/2018   Procedure: SUPERFICIAL ANAL FISTULOTOMY;  Surgeon: Karie Soda, MD;  Location: [facility] OR;  Service: General;  Laterality: N/A;   HEMORRHOID SURGERY     HERNIA REPAIR N/A 07/04/2012   Umbilical hernia repair   INSERTION OF DIALYSIS CATHETER N/A 01/27/2016   Procedure: INSERTION OF DIALYSIS CATHETER;  Surgeon: Maeola Harman, MD;  Location: [facility] OR;  Service: Vascular;  Laterality: N/A;   INSERTION OF MESH N/A 07/04/2012   Procedure: INSERTION OF MESH;  Surgeon: Lodema Pilot, DO;  Location: [facility] OR;  Service: General;  Laterality: N/A;   IR EMBO ART  VEN HEMORR LYMPH EXTRAV  INC GUIDE ROADMAPPING  01/13/2018   IR FLUORO GUIDE CV LINE RIGHT  01/13/2018   IR IVC FILTER  PLMT / S&I /IMG GUID/MOD SED  01/27/2018   IR RENAL SELECTIVE  UNI INC S&I MOD SED  01/13/2018   IR US GUIDE VASC ACCESS RIGHT  01/13/2018   KIDNEY CYST REMOVAL  2019   LAPAROSCOPIC LYSIS OF ADHESIONS N/A 02/12/2016   Procedure: LAPAROSCOPIC LYSIS OF ADHESIONS;  Surgeon: Avel Peace, MD;  Location: [facility] OR;  Service: General;  Laterality: N/A;   LEFT AND RIGHT HEART CATHETERIZATION WITH CORONARY ANGIOGRAM N/A 08/04/2012   Procedure: LEFT AND RIGHT HEART CATHETERIZATION WITH CORONARY ANGIOGRAM;  Surgeon: Ricki Rodriguez, MD;  [facility]: [facility] CATH LAB;  Service: Cardiovascular;  Laterality: N/A;   NEPHRECTOMY  2000   right   REVERSE SHOULDER ARTHROPLASTY Right 09/20/2022   Procedure: REVERSE SHOULDER ARTHROPLASTY;  Surgeon: Yolonda Kida, MD;  Location: [facility] OR;  Service: Orthopedics;  Laterality: Right;  120   REVISON OF ARTERIOVENOUS FISTULA Left 06/05/2013   Procedure: REVISON OF LEFT RADIOCEPHALIC  ARTERIOVENOUS FISTULA;  Surgeon: Fransisco Hertz, MD;  Location: [facility] OR;  Service: Vascular;  Laterality: Left;   REVISON OF ARTERIOVENOUS FISTULA Left 01/27/2016   Procedure: REVISION OF LEFT UPPER EXTREMITY ARTERIOVENOUS FISTULA;  Surgeon: Maeola Harman, MD;  Location: [facility] OR;  Service: Vascular;  Laterality:  Left;   REVISON OF ARTERIOVENOUS FISTULA Left 08/03/2016   Procedure: REVISON OF Left arm ARTERIOVENOUS FISTULA;  Surgeon: Sherren Kerns, MD;  Location: [facility] OR;  Service: Vascular;  Laterality: Left;   REVISON OF ARTERIOVENOUS FISTULA Left 05/06/2017   Procedure: REVISION PLICATION OF ARTERIOVENOUS FISTULA LEFT ARM;  Surgeon: Larina Earthly, MD;  [facility] OR;  Service: Vascular;  Laterality: Left;   REVISON OF ARTERIOVENOUS FISTULA Left 02/01/2019   Procedure: REVISION OF ARTERIOVENOUS FISTULA LEFT ARM;  Surgeon: Cephus Shelling, MD;  Location: [facility] OR;  Service: Vascular;  Laterality: Left;   REVISON OF ARTERIOVENOUS FISTULA Left 03/15/2019   Procedure: REVISION PLICATION OF  ARTERIOVENOUS FISTULA LEFT ARM;  Surgeon: Cephus Shelling, MD;  [facility]: [facility] OR;  Service: Vascular;  Laterality: Left;   RIGHT HEART CATHETERIZATION N/A 05/21/2011   Procedure: RIGHT HEART CATH;  Surgeon: Ricki Rodriguez, MD;  Location: [facility] CATH LAB;  Service: Cardiovascular;  Laterality: N/A;   smashed  1990's   "left pinky; have a plate in there"   UMBILICAL HERNIA REPAIR N/A 07/04/2012   Procedure: LAPAROSCOPIC UMBILICAL HERNIA;  Surgeon: Lodema Pilot, DO;  Location: [facility] OR;  Service: General;  Laterality: N/A;   US ECHOCARDIOGRAPHY  05/20/11   Social History:  reports that he has never smoked. He has never used smokeless tobacco. He reports current drug use. Drug: Hydrocodone. He reports that he does not drink alcohol.  Allergies  Allergen Reactions   Ace Inhibitors Itching, Cough and Other (See Comments)    Family History  Problem Relation Age of Onset   Hypertension Mother    Kidney disease Mother     Prior to Admission medications   Medication Sig Start Date End Date Taking? Authorizing Provider  albuterol (VENTOLIN HFA) 108 (90 Base) MCG/ACT inhaler Inhale 2 puffs into the lungs every 6 (six) hours as needed for wheezing or shortness of breath. 09/18/19  Yes [name], FNP  calcium acetate (PHOSLO) 667 MG capsule Take 1-3 capsules (667-2,001 mg total) by mouth See admin instructions. Take 2001 mg capsules with meals three times daily and 667 mg capsule with snacks. 11/23/18  Yes Orpah Cobb, MD  chlorhexidine (PERIDEX) 0.12 % solution 15 mLs by Mouth Rinse route 2 (two) times daily. 05/29/18  Yes [provider]  Control Gel Formula Dressing (DUODERM CGF EXTRA THIN) MISC Apply 1 each topically daily as needed. 10/26/20  Yes [name], FNP  digoxin (LANOXIN) 0.125 MG tablet Take 1 tablet (0.125 mg total) by mouth every Monday, Wednesday, and Friday. 06/09/20  Yes Rai, Delene Ruffini, MD  Etelcalcetide HCl (PARSABIV IV)  07/05/22 06/15/23 Yes [provider]   gabapentin (NEURONTIN) 300 MG capsule Take 1 capsule (300 mg total) by mouth at bedtime. 12/22/22  Yes [name], FNP  HYDROcodone-acetaminophen (NORCO) 10-325 MG tablet Take 0.5 tablets by mouth 2 (two) times daily as needed for moderate pain. 06/17/22  Yes [provider]  HYDROcodone-acetaminophen (NORCO) 7.5-325 MG tablet Take 1 tablet by mouth every 6 (six) hours as needed for moderate pain. 09/21/22  Yes McClung, Kevan D, PA  isosorbide dinitrate (ISORDIL) 10 MG tablet Take 10 mg by mouth 2 (two) times daily. 07/07/21  Yes [provider]  Lactobacillus (ACIDOPHILUS/BIFIDUS) WAFR Take 1 Wafer by mouth daily. 07/01/22  Yes [name], FNP  lidocaine (LIDODERM) 5 % Place 1 patch onto the skin daily. Remove & Discard patch within 12 hours or as directed by MD 09/11/22  Yes Netta Corrigan,  PA-C  loperamide (IMODIUM A-D) 2 MG tablet Take 1 tablet (2 mg total) by mouth 4 (four) times daily as needed for diarrhea or loose stools. Also available OTC. 06/09/20  Yes Rai, Ripudeep K, MD  meclizine (ANTIVERT) 25 MG tablet Take 1 tablet (25 mg total) by mouth 3 (three) times daily as needed for dizziness. 04/08/21  Yes Horton, Clabe Seal, DO  metoprolol succinate (TOPROL-XL) 25 MG 24 hr tablet Take 1 tablet (25 mg total) by mouth daily. 06/09/20  Yes Rai, Ripudeep K, MD  midodrine (PROAMATINE) 10 MG tablet Take 1 tablet (10 mg total) by mouth 3 (three) times daily. 06/09/20  Yes Rai, Ripudeep K, MD  multivitamin (RENA-VIT) TABS tablet Take 1 tablet by mouth at bedtime.   Yes [provider]  Nutritional Supplements (FEEDING SUPPLEMENT, NEPRO CARB STEADY,) LIQD Take 237 mLs by mouth daily. 03/22/16  Yes [provider]  ondansetron (ZOFRAN ODT) 4 MG disintegrating tablet Take 1 tablet (4 mg total) by mouth every 8 (eight) hours as needed for nausea or vomiting. 06/09/20  Yes Rai, Ripudeep K, MD  pantoprazole (PROTONIX) 40 MG tablet Take 40 mg by mouth 2 (two) times daily.    Yes [provider]  sucroferric oxyhydroxide (VELPHORO) 500 MG chewable tablet Chew 500-1,000 mg by mouth See admin instructions. Takes 2 tablets with each meal and 1 tablet with snacks. 10/02/18  Yes [provider]  UNABLE TO FIND Out patient physical therapy- vestibular therapy for BPPV 08/01/18  Yes Rizwan, Ladell Heads, MD  warfarin (COUMADIN) 7.5 MG tablet Take 7.5 mg by mouth daily.   Yes [provider]  glucose blood test strip Use to check blood sugar 3 times a day. Dx code e11.65 10/13/21   [name], FNP  Lancets [facility] DELICA PLUS [place]) MISC Use to check blood sugar 3 times a day. Dx code e11.65 10/13/21   [name], FNP  nitroGLYCERIN (NITROSTAT) 0.4 MG SL tablet Place 0.4 mg under the tongue every 5 (five) minutes as needed for chest pain.  05/20/15   [provider]    Physical Exam: Vitals:   03/18/23 1645 03/18/23 1700 03/18/23 1714 03/18/23 1724  BP: 108/69 109/69 114/69 117/72  Pulse: 83 82 80 81  Resp: (!) 22 (!) 21 19 17   Temp:   (!) 97.5 F (36.4 C) (!) 97.4 F (36.3 C)  TempSrc:    Oral  SpO2: 99% 100% 100% 100%  Weight:      Height:       General: resting in bed, fatigued postoperatively HEENT:  EOMI, no scleral icterus Cardiac: RRR, no rubs, murmurs or gallops Pulm: clear to auscultation anteriorly Abd: soft, nontender Ext: S/p left shoulder reverse arthroplasty, shoulder in sling Neuro: alert and oriented X3 Data Reviewed:   There are no new results to review at this time.    Family Communication: None present Primary team communication:  Dr. Shon Baton Thank you very much for involving [place] in the care of your patient.  Author: Darreld Mclean, MD 03/18/2023 7:14 PM  For on call review [email or web address].

## 2023-03-18 NOTE — Op Note (Signed)
03/18/2023  4:19 PM  PATIENT:  [name]    PRE-OPERATIVE DIAGNOSIS:  Left shoulder osteoarthritis  POST-OPERATIVE DIAGNOSIS:  Same  PROCEDURE:  REVERSE SHOULDER ARTHROPLASTY  SURGEON:  Yolonda Kida, MD  ASSISTANT: Dion Saucier, PA-C  Assistant attestation:  PA Mcclung present for the entire procedure  ANESTHESIA:   General With interscalene Exparel  ESTIMATED BLOOD LOSS: 50 cc  PREOPERATIVE INDICATIONS:  [name] is a  62 y.o. male with a diagnosis of Left shoulder osteoarthritis who failed conservative measures and elected for surgical management.    The risks benefits and alternatives were discussed with the patient preoperatively including but not limited to the risks of infection, bleeding, nerve injury, cardiopulmonary complications, the need for revision surgery, dislocation, brachial plexus palsy, incomplete relief of pain, among others, and the patient was willing to proceed.  OPERATIVE IMPLANTS:  Zimmer identity stem size 13 with a +0 extended neutral humeral tray and a standard polyethylene  Large glenoid augment oriented at the 11 o'clock position.  20 mm central screw, 35 mm superior locking screw and 15 mm posterior as well as 15 mm inferior locking screw  Size 36+3 mm lateralized glenosphere   OPERATIVE FINDINGS: Advanced glenohumeral osteoarthritis with significant synovitis consistent with his renal osteodystrophy.  Significant humeral head flattening with central sclerosis.  On the glenoid side significant synovitic burden as well as medialization of the glenoid to the level of the base of the coracoid process.  No labrum remaining and no articular cartilage on the glenoid or humeral side.  OPERATIVE PROCEDURE: The patient was brought to the operating room and placed in the supine position. General anesthesia was administered. IV antibiotics were given. A Foley was not placed. Time out was performed. The upper extremity was prepped and draped  in usual sterile fashion. The patient was in a beachchair position. Deltopectoral approach was carried out. The biceps was tenodesed to the pectoralis tendon with #2 Fiberwire. The subscapularis was released off of the bone.   I then performed circumferential releases of the humerus, and then dislocated the head, and then reamed with the reamer to the above named size.  I then applied the jig, and cut the humeral head in 30 of retroversion, and then turned my attention to the glenoid.  Deep retractors were placed, and I resected the labrum, and then placed a guidepin into the center position on the glenoid, with slight inferior inclination.  We then utilized the 3 printed model to help locate the midportion of the fall at this he had significant deformity and a B3 glenoid.  We then drilled over this to prepare for the superiorly oriented augment.  We then did the second reamer for the superior augment.  The large augment was templated and felt to be correct and fit well with good backside contact.  I then reamed over the guidepin, and this created a small metaphyseal cancellus blush inferiorly, removing just the cartilage to the subchondral bone superiorly. The base plate was selected and impacted place, and then I secured it centrally with a nonlocking screw, and I had excellent purchase both inferiorly and superiorly. I placed a short locking screws on anterior and posterior aspects.  I then turned my attention to the glenosphere, and impacted this into place, placing slight inferior offset (set on A).   The glenoid sphere was completely seated, and had engagement of the [facility] taper. I then turned my attention back to the humerus.  I sequentially broached, and then  trialed, and was found to restore soft tissue tension, and it had 2 finger tightness. Therefore the above named components were selected. The shoulder felt stable throughout functional motion.  I then impacted the real prosthesis into  place, as well as the real humeral tray, and reduced the shoulder. The shoulder had excellent motion, and was stable, and I irrigated the wounds copiously.   Subscapularis was repaired to the intertubercular groove with transosseous tunnels and #2 max braid suture.  I then irrigated the shoulder copiously once more, repaired the deltopectoral interval with #2 max braid suture followed by subcutaneous Vicryl, then monocryl for the skin,  with Steri-Strips and sterile gauze for the skin. The patient was awakened and returned back in stable and satisfactory condition. There no complications and they tolerated the procedure well.  All counts were correct x2.   Disposition:  [name] will be admitted to the orthopedic service overnight for observation including pain control and to have a session with her occupational therapy team.  He typically receives hemodialysis on Monday Wednesday Friday.  We discussed this with the nephrology service.  They will either dialyze here tomorrow morning or have him hooked up with the outpatient service sometime Saturday morning or afternoon.  Otherwise from orthopedic standpoint can discharge home tomorrow.

## 2023-03-18 NOTE — Anesthesia Procedure Notes (Signed)
Anesthesia Procedure Image    

## 2023-03-18 NOTE — Anesthesia Procedure Notes (Signed)
Central Venous Catheter Insertion Performed by: Eilene Ghazi, MD, anesthesiologist Start/End10/25/2024 1:08 PM, 03/18/2023 1:25 PM Patient location: Pre-op. Preanesthetic checklist: patient identified, IV checked, site marked, risks and benefits discussed, surgical consent, monitors and equipment checked, pre-op evaluation, timeout performed and anesthesia consent Position: Trendelenburg Lidocaine 1% used for infiltration and patient sedated Hand hygiene performed  and maximum sterile barriers used  Catheter size: 8 Fr Total catheter length 16. Central line was placed.Double lumen Procedure performed using ultrasound guided technique. Ultrasound Notes:anatomy identified, needle tip was noted to be adjacent to the nerve/plexus identified, no ultrasound evidence of intravascular and/or intraneural injection and image(s) printed for medical record Attempts: 1 Following insertion, dressing applied, line sutured and Biopatch. Post procedure assessment: blood return through all ports  Patient tolerated the procedure well with no immediate complications.

## 2023-03-18 NOTE — Brief Op Note (Signed)
03/18/2023  4:12 PM  PATIENT:  [name]  62 y.o. male  PRE-OPERATIVE DIAGNOSIS:  Left shoulder osteoarthritis  POST-OPERATIVE DIAGNOSIS:  Left shoulder osteoarthritis  PROCEDURE:  Procedure(s) with comments: REVERSE SHOULDER ARTHROPLASTY (Left) - 120  SURGEON:  Surgeons and Role:    * Yolonda Kida, MD - Primary  PHYSICIAN ASSISTANT: Dion Saucier, PA-C  ANESTHESIA:   regional and general  EBL:  50 cc  BLOOD ADMINISTERED:none  DRAINS: none   LOCAL MEDICATIONS USED:  NONE  SPECIMEN:  No Specimen  DISPOSITION OF SPECIMEN:  N/A  COUNTS:  YES  TOURNIQUET:  * No tourniquets in log *  DICTATION: .Note written in EPIC  PLAN OF CARE: Admit to inpatient   PATIENT DISPOSITION:  PACU - hemodynamically stable.   Delay start of Pharmacological VTE agent (>24hrs) due to surgical blood loss or risk of bleeding: not applicable

## 2023-03-18 NOTE — Progress Notes (Signed)
Unable to obtain PIV access. Pre-op nurses attempted x2 and CRNA attempted x1. Dr. Okey Dupre made aware and out to Pre-op to place a central line. Dr. Okey Dupre also made aware that patient's CBG is 58. Patient is asymptomatic and denies any complaints. Hypoglycemic protocol to be followed per Dr. Okey Dupre once central line is placed.

## 2023-03-18 NOTE — Progress Notes (Addendum)
Changed dressing due to dressing coming off. Pt appears to have double lumen CVC in L neck but no tip confirmation documented by inserter. Primary RN aware. Requested chest x ray to be ordered to confirm tip placement. Discouraged use of CVC at this time until tip placement confirmed. PIV placed at this time for access. Pt does have severely limited access. Able to place 22g in R forearm at this time but no other appropriate veins found. Spoke with primary RN and recommended CVC to stay in pt as long as tip is confirmed via x ray. Also spoke with internal medicine dr about possible x ray but stated that would be up to primary team.

## 2023-03-18 NOTE — Progress Notes (Signed)
Received patient from PACU. Upon arrival to floor, Investment banker, operational observed dressing to R internal jugular CVC coming off. RN Clinical research associate reinforced dressing and placed an order to IV to redress line. RN Clinical research associate then [employer] by IV team nurse, stating there was not an x-ray performed confirming tip placement. RN Clinical research associate called Aundria Rud, MD who stated the catheter was placed by Anesthesia via ultrasound. Investment banker, operational then spoke to D. Shon Baton, MD regarding the x-ray being ordered to confirm placement of the tip, otherwise CVC could not be used on floor. Other IV access established by IV team to the R FA. X-ray still not ordered and CVC not in use at this time.

## 2023-03-18 NOTE — H&P (Signed)
ORTHOPAEDIC H&P  REQUESTING PHYSICIAN: Yolonda Kida, MD  PCP:  [name], FNP  Chief Complaint: Left shoulder arthritis  HPI: [name] is a 62 y.o. male who complains of left shoulder pain and weakness.  He also has significant stiffness.  Consistent with advanced osteoarthritis with glenoid deformity.  Here today for reverse arthroplasty on the left.  Previously had 1 on the right back in April and did quite well with that.  He has been optimized from a cardiology standpoint.  Also will hope to have him dialyzed while here over the weekend and back to his Monday Wednesday Friday schedule next week.  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 Algie Coffer follows   Diabetes mellitus    pt. states he's borderline diabetic., no longer taking med,- off med. since 2013   Dialysis patient [facility])    Mon-Wed-Fri([facility])- Left AV fistula   Diverticulitis 03/2015   reoccurred in December 2016   Diverticulitis of intestine without perforation or abscess without bleeding 04/21/2015   Dizziness 11/22/2018   DVT (deep venous thrombosis) (HCC)    ESRD (end stage renal disease) (HCC)    GERD (gastroesophageal reflux disease)    Gout    Hemorrhage of left kidney 01/13/2018   History of nephrectomy 07/04/2012   History of right nephrectomy in 2002 for renal cell carcinoma    History of unilateral nephrectomy    Hypertension    Hypotension due to hypovolemia 06/04/2020   Low iron    Myocardial infarction [facility]) ?2006   Renal cell carcinoma    dialysis- MWF-  [address]- Dr. Briant Cedar follows.   Renal insufficiency    Shortness of breath 05/19/2011   "at rest, lying down, w/exertion"   Stroke [facility]) 02/2011   05/19/11 denies residual   Umbilical hernia 05/19/2011   unrepaired   Wears glasses    Past Surgical History:  Procedure Laterality Date   ANAL FISTULOTOMY  08/15/2018   AV FISTULA PLACEMENT  03/29/2011   Procedure: ARTERIOVENOUS (AV) FISTULA CREATION;  Surgeon: Nilda Simmer, MD;  Location: [facility] OR;  Service: Vascular;  Laterality: Left;  LEFT RADIOCEPHALIC , Arteriovenous [phone])   CARDIAC CATHETERIZATION  05/21/11   CARDIAC CATHETERIZATION N/A 03/16/2016   Procedure: Left Heart Cath and Coronary Angiography;  Surgeon: Orpah Cobb, MD;  [facility]: [facility] INVASIVE CV LAB;  Service: Cardiovascular;  Laterality: N/A;   CHOLECYSTECTOMY N/A 02/12/2016   Procedure: LAPAROSCOPIC CHOLECYSTECTOMY WITH INTRAOPERATIVE CHOLANGIOGRAM;  Surgeon: Avel Peace, MD;  Location: [facility] OR;  Service: General;  Laterality: N/A;   COLONOSCOPY WITH PROPOFOL N/A 02/01/2017   Procedure: COLONOSCOPY WITH PROPOFOL;  Surgeon: Charna Elizabeth, MD;  Location: [facility] ENDOSCOPY;  Service: Endoscopy;  Laterality: N/A;   dialysis cath placed     ESOPHAGOGASTRODUODENOSCOPY (EGD) WITH PROPOFOL N/A 12/02/2015   Procedure: ESOPHAGOGASTRODUODENOSCOPY (EGD) WITH PROPOFOL;  Surgeon: Charna Elizabeth, MD;  Location: [facility] ENDOSCOPY;  Service: Endoscopy;  Laterality: N/A;   EVALUATION UNDER ANESTHESIA WITH HEMORRHOIDECTOMY N/A 08/15/2018   Procedure: HEMORRHOID LIGATION/PEXY;  Surgeon: Michaell Cowing,  Viviann Spare, MD;  Location: [facility] OR;  Service: General;  Laterality: N/A;   FINGER SURGERY     L pinkie finger- ORIF- /w remaining hardware - 1990's     FISTULOTOMY N/A 08/15/2018   Procedure: SUPERFICIAL ANAL FISTULOTOMY;  Surgeon: Karie Soda, MD;  Location: [facility] OR;  Service: General;  Laterality: N/A;   HEMORRHOID SURGERY     HERNIA REPAIR N/A 07/04/2012   Umbilical hernia repair   INSERTION OF  DIALYSIS CATHETER N/A 01/27/2016   Procedure: INSERTION OF DIALYSIS CATHETER;  Surgeon: Maeola Harman, MD;  Location: [facility] OR;  Service: Vascular;  Laterality: N/A;   INSERTION OF MESH N/A 07/04/2012   Procedure: INSERTION OF MESH;  Surgeon: Lodema Pilot, DO;  Location: [facility] OR;  Service: General;  Laterality: N/A;   IR EMBO ART  VEN HEMORR LYMPH EXTRAV  INC GUIDE ROADMAPPING  01/13/2018   IR FLUORO GUIDE CV LINE RIGHT  01/13/2018   IR IVC FILTER PLMT / S&I /IMG GUID/MOD SED  01/27/2018   IR RENAL SELECTIVE  UNI INC S&I MOD SED  01/13/2018   IR US GUIDE VASC ACCESS RIGHT  01/13/2018   KIDNEY CYST REMOVAL  2019   LAPAROSCOPIC LYSIS OF ADHESIONS N/A 02/12/2016   Procedure: LAPAROSCOPIC LYSIS OF ADHESIONS;  Surgeon: Avel Peace, MD;  Location: [facility] OR;  Service: General;  Laterality: N/A;   LEFT AND RIGHT HEART CATHETERIZATION WITH CORONARY ANGIOGRAM N/A 08/04/2012   Procedure: LEFT AND RIGHT HEART CATHETERIZATION WITH CORONARY ANGIOGRAM;  Surgeon: Ricki Rodriguez, MD;  [facility]: [facility] CATH LAB;  Service: Cardiovascular;  Laterality: N/A;   NEPHRECTOMY  2000   right   REVERSE SHOULDER ARTHROPLASTY Right 09/20/2022   Procedure: REVERSE SHOULDER ARTHROPLASTY;  Surgeon: Yolonda Kida, MD;  Location: [facility] OR;  Service: Orthopedics;  Laterality: Right;  120   REVISON OF ARTERIOVENOUS FISTULA Left 06/05/2013   Procedure: REVISON OF LEFT RADIOCEPHALIC  ARTERIOVENOUS FISTULA;  Surgeon: Fransisco Hertz, MD;  Location: [facility] OR;  Service: Vascular;  Laterality: Left;   REVISON OF ARTERIOVENOUS FISTULA Left 01/27/2016   Procedure: REVISION OF LEFT UPPER EXTREMITY ARTERIOVENOUS FISTULA;  Surgeon: Maeola Harman, MD;  Location: [facility] OR;  Service: Vascular;  Laterality: Left;   REVISON OF ARTERIOVENOUS FISTULA Left 08/03/2016   Procedure: REVISON OF Left arm ARTERIOVENOUS FISTULA;  Surgeon: Sherren Kerns, MD;  Location: [facility] OR;  Service: Vascular;  Laterality: Left;   REVISON OF ARTERIOVENOUS FISTULA Left  05/06/2017   Procedure: REVISION PLICATION OF ARTERIOVENOUS FISTULA LEFT ARM;  Surgeon: Larina Earthly, MD;  [facility] OR;  Service: Vascular;  Laterality: Left;   REVISON OF ARTERIOVENOUS FISTULA Left 02/01/2019   Procedure: REVISION OF ARTERIOVENOUS FISTULA LEFT ARM;  Surgeon: Cephus Shelling, MD;  Location: [facility] OR;  Service: Vascular;  Laterality: Left;   REVISON OF ARTERIOVENOUS FISTULA Left 03/15/2019   Procedure: REVISION PLICATION OF ARTERIOVENOUS FISTULA LEFT ARM;  Surgeon: Cephus Shelling, MD;  [facility] OR;  Service: Vascular;  Laterality: Left;   RIGHT HEART CATHETERIZATION N/A 05/21/2011   Procedure: RIGHT HEART CATH;  Surgeon: Ricki Rodriguez, MD;  [facility] CATH LAB;  Service: Cardiovascular;  Laterality: N/A;   smashed  1990's   "left pinky; have a plate in there"   UMBILICAL HERNIA REPAIR N/A 07/04/2012   Procedure: LAPAROSCOPIC UMBILICAL HERNIA;  Surgeon: Lodema Pilot, DO;  Location: [facility] OR;  Service: General;  Laterality: N/A;   US ECHOCARDIOGRAPHY  05/20/11   Social History   Socioeconomic  History   Marital status: Married    Spouse name: Not on file   Number of children: Not on file   Years of education: Not on file   Highest education level: Not on file  Occupational History   Occupation: disability  Tobacco Use   Smoking status: Never   Smokeless tobacco: Never  Vaping Use   Vaping status: Never Used  Substance and Sexual Activity   Alcohol use: No    Alcohol/week: 0.0 standard drinks of alcohol   Drug use: Yes    Types: Hydrocodone    Comment: Past hx-none in 20 yrs   Sexual activity: Yes  Other Topics Concern   Not on file  Social History Narrative   Not on file   Social Determinants of Health   Financial Resource Strain: Low Risk  (01/19/2023)   Overall Financial Resource Strain (CARDIA)    Difficulty of Paying Living Expenses: Not hard at all  Food Insecurity: No Food Insecurity (01/19/2023)   Hunger Vital Sign    Worried About  Running Out of Food in the Last Year: Never true    Ran Out of Food in the Last Year: Never true  Transportation Needs: No Transportation Needs (01/19/2023)   PRAPARE - Administrator, Civil Service (Medical): No    Lack of Transportation (Non-Medical): No  Physical Activity: Inactive (01/19/2023)   Exercise Vital Sign    Days of Exercise per Week: 0 days    Minutes of Exercise per Session: 0 min  Stress: No Stress Concern Present (01/19/2023)   [employer] of Occupational Health - Occupational Stress Questionnaire    Feeling of Stress : Not at all  Social Connections: Moderately Integrated (01/19/2023)   Social Connection and Isolation Panel [NHANES]    Frequency of Communication with Friends and Family: More than three times a week    Frequency of Social Gatherings with Friends and Family: Once a week    Attends Religious Services: More than 4 times per year    Active Member of [employer] or Organizations: No    Attends Engineer, structural: Never    Marital Status: Married   Family History  Problem Relation Age of Onset   Hypertension Mother    Kidney disease Mother    Allergies  Allergen Reactions   Ace Inhibitors Itching, Cough and Other (See Comments)   Prior to Admission medications   Medication Sig Start Date End Date Taking? Authorizing Provider  albuterol (VENTOLIN HFA) 108 (90 Base) MCG/ACT inhaler Inhale 2 puffs into the lungs every 6 (six) hours as needed for wheezing or shortness of breath. 09/18/19  Yes [name], FNP  calcium acetate (PHOSLO) 667 MG capsule Take 1-3 capsules (667-2,001 mg total) by mouth See admin instructions. Take 2001 mg capsules with meals three times daily and 667 mg capsule with snacks. 11/23/18  Yes Orpah Cobb, MD  chlorhexidine (PERIDEX) 0.12 % solution 15 mLs by Mouth Rinse route 2 (two) times daily. 05/29/18  Yes [provider]  Control Gel Formula Dressing (DUODERM CGF EXTRA THIN) MISC Apply 1 each topically  daily as needed. 10/26/20  Yes [name], FNP  digoxin (LANOXIN) 0.125 MG tablet Take 1 tablet (0.125 mg total) by mouth every Monday, Wednesday, and Friday. 06/09/20  Yes Rai, Delene Ruffini, MD  Etelcalcetide HCl (PARSABIV IV)  07/05/22 06/15/23 Yes [provider]  gabapentin (NEURONTIN) 300 MG capsule Take 1 capsule (300 mg total) by mouth at bedtime. 12/22/22  Yes Christell Constant,  Lolita Cram, FNP  HYDROcodone-acetaminophen (NORCO) 10-325 MG tablet Take 0.5 tablets by mouth 2 (two) times daily as needed for moderate pain. 06/17/22  Yes [provider]  HYDROcodone-acetaminophen (NORCO) 7.5-325 MG tablet Take 1 tablet by mouth every 6 (six) hours as needed for moderate pain. 09/21/22  Yes McClung, Kevan D, PA  isosorbide dinitrate (ISORDIL) 10 MG tablet Take 10 mg by mouth 2 (two) times daily. 07/07/21  Yes [provider]  Lactobacillus (ACIDOPHILUS/BIFIDUS) WAFR Take 1 Wafer by mouth daily. 07/01/22  Yes [name], FNP  lidocaine (LIDODERM) 5 % Place 1 patch onto the skin daily. Remove & Discard patch within 12 hours or as directed by MD 09/11/22  Yes Schuman, Beverly Gust, PA-C  loperamide (IMODIUM A-D) 2 MG tablet Take 1 tablet (2 mg total) by mouth 4 (four) times daily as needed for diarrhea or loose stools. Also available OTC. 06/09/20  Yes Rai, Ripudeep K, MD  meclizine (ANTIVERT) 25 MG tablet Take 1 tablet (25 mg total) by mouth 3 (three) times daily as needed for dizziness. 04/08/21  Yes Horton, Clabe Seal, DO  metoprolol succinate (TOPROL-XL) 25 MG 24 hr tablet Take 1 tablet (25 mg total) by mouth daily. 06/09/20  Yes Rai, Ripudeep K, MD  midodrine (PROAMATINE) 10 MG tablet Take 1 tablet (10 mg total) by mouth 3 (three) times daily. 06/09/20  Yes Rai, Ripudeep K, MD  multivitamin (RENA-VIT) TABS tablet Take 1 tablet by mouth at bedtime.   Yes [provider]  nitroGLYCERIN (NITROSTAT) 0.4 MG SL tablet Place 0.4 mg under the tongue every 5 (five) minutes as needed for chest pain.   05/20/15  Yes [provider]  Nutritional Supplements (FEEDING SUPPLEMENT, NEPRO CARB STEADY,) LIQD Take 237 mLs by mouth daily. 03/22/16  Yes [provider]  ondansetron (ZOFRAN ODT) 4 MG disintegrating tablet Take 1 tablet (4 mg total) by mouth every 8 (eight) hours as needed for nausea or vomiting. 06/09/20  Yes Rai, Ripudeep K, MD  pantoprazole (PROTONIX) 40 MG tablet Take 40 mg by mouth 2 (two) times daily.   Yes [provider]  sucroferric oxyhydroxide (VELPHORO) 500 MG chewable tablet Chew 500-1,000 mg by mouth See admin instructions. Takes 2 tablets with each meal and 1 tablet with snacks. 10/02/18  Yes [provider]  UNABLE TO FIND Out patient physical therapy- vestibular therapy for BPPV 08/01/18  Yes Rizwan, Ladell Heads, MD  warfarin (COUMADIN) 7.5 MG tablet Take 7.5 mg by mouth daily.   Yes [provider]  glucose blood test strip Use to check blood sugar 3 times a day. Dx code e11.65 10/13/21   [name], FNP  Lancets [facility] DELICA PLUS Waucoma) MISC Use to check blood sugar 3 times a day. Dx code e11.65 10/13/21   [name], FNP   No results found.  Positive ROS: All other systems have been reviewed and were otherwise negative with the exception of those mentioned in the HPI and as above.  Physical Exam: General: Alert, no acute distress Cardiovascular: No pedal edema Respiratory: No cyanosis, no use of accessory musculature GI: No organomegaly, abdomen is soft and non-tender Skin: No lesions in the area of chief complaint Neurologic: Sensation intact distally Psychiatric: Patient is competent for consent with normal mood and affect Lymphatic: No axillary or cervical lymphadenopathy  MUSCULOSKELETAL: Left upper extremity warm well-perfused.  Does have palpable thrill at the fistula in the [facility] fossa.  Distally neurovascular intact.  Assessment: Left shoulder advanced glenohumeral arthritis  Plan: -Plan  proceed today with  reverse arthroplasty left shoulder to address glenoid deformity and end-stage arthritis.  Previously had the same on the right.  No new questions or concerns at this time.  -Today we again discussed the risk and benefits of the procedure which include but not limited to bleeding, infection, fracture, dislocation, damage to surrounding nerves and vessels, stiffness, failure of pain relief as well as the risk of anesthesia.  He is provided informed consent.  -Will admit postoperatively.  Will discuss with the renal team in terms of having him dialyzed today versus tomorrow.  He can hopefully discharge home tomorrow.    Yolonda Kida, MD Cell [phone]    03/18/2023 12:06 PM

## 2023-03-18 NOTE — Consult Note (Signed)
ESRD Consult Note  Reason for consult: ESRD, provision of dialysis  Assessment/Recommendations:  ESRD  -outpatient HD orders: [facility].  4 hours.  LUE AVF.  15-gauge needles.  Flow rates: 450/600.  EDW 105.8.  2K/2.5 calcium.  Heparin: 4000 units bolus.  Meds: Parsabiv 15 mg with dialysis, Hectorol 8 mcg with dialysis -Given inpatient census, we will plan for HD tomorrow prior to potential discharge  Left shoulder osteoarthritis -Left reverse shoulder arthroplasty 10/25.  Per Ortho  Volume/ hypertension  -UF as tolerated  Anemia of Chronic Kidney Disease -Hemoglobin 10.9.  -Transfuse PRN for Hgb <7  Secondary Hyperparathyroidism/Hyperphosphatemia - resume home binders   Recommendations were discussed with the primary team.  Anthony Sar, MD  [facility]  History of Present Illness: [name] is a/an 62 y.o. male with a past medical history of ESRD, hypertension, DM, CHF, DVT, right nephrectomy, OA who presents for left shoulder arthroplasty which was done today by Ortho.  He is staying overnight.  Consult called for provision of dialysis. Patient seen and examined in PACU. Currently drowsy.   Medications:  Current Facility-Administered Medications  Medication Dose Route Frequency Provider Last Rate Last Admin   0.9 %  sodium chloride infusion   Intravenous Continuous Eilene Ghazi, MD   New Bag at 03/18/23 1420   0.9 % irrigation (POUR BTL)    PRN Yolonda Kida, MD   1,000 mL at 03/18/23 1411   [START ON 03/19/2023] ceFAZolin (ANCEF) IVPB 2g/100 mL premix  2 g Intravenous On Call to OR McClung, Kevan D, PA       chlorhexidine (PERIDEX) 0.12 % solution 15 mL  15 mL Mouth/Throat Once Eilene Ghazi, MD       Or   Oral care mouth rinse  15 mL Mouth Rinse Once Eilene Ghazi, MD       dextrose 50 % solution 12.5 g  12.5 g Intravenous STAT Eilene Ghazi, MD       vancomycin [facility]) powder    PRN Yolonda Kida, MD   1,000 mg at 03/18/23 1605    Facility-Administered Medications Ordered in Other Encounters  Medication Dose Route Frequency Provider Last Rate Last Admin   albumin human 5 % solution   Intravenous Continuous PRN Salnikova, Evgenia A, CRNA   Stopped at 03/18/23 1523   bupivacaine liposome (EXPAREL) 1.3 % injection   Peri-NEURAL Anesthesia Intra-op Eilene Ghazi, MD   10 mL at 03/18/23 1409   bupivacaine(PF) (MARCAINE) 0.5 % injection   Peri-NEURAL Anesthesia Intra-op Eilene Ghazi, MD   15 mL at 03/18/23 1409   ceFAZolin (ANCEF) IVPB 2 g/50 mL premix   Intravenous Anesthesia Intra-op Caryn Bee A, CRNA   2 g at 03/18/23 1437   dexamethasone (DECADRON) injection   Intravenous Anesthesia Intra-op [place] Moos, Evgenia A, CRNA   5 mg at 03/18/23 1502   etomidate (AMIDATE) injection   Intravenous Anesthesia Intra-op Caryn Bee A, CRNA   16 mg at 03/18/23 1433   fentaNYL citrate (PF) (SUBLIMAZE) injection   Intravenous Anesthesia Intra-op Caryn Bee A, CRNA   50 mcg at 03/18/23 1407   midazolam (VERSED) injection   Intravenous Anesthesia Intra-op Caryn Bee A, CRNA   1 mg at 03/18/23 1418   ondansetron (ZOFRAN) injection   Intravenous Anesthesia Intra-op Caryn Bee A, CRNA   4 mg at 03/18/23 1551   phenylephrine (NEO-SYNEPHRINE) 20mg /NS premix infusion   Intravenous Continuous PRN Caryn Bee A, CRNA   Stopped at 03/18/23 1623  rocuronium bromide 10 mg/mL (PF) syringe   Intravenous Anesthesia Intra-op Salnikova, Evgenia A, CRNA   10 mg at 03/18/23 1514   sugammadex sodium (BRIDION) injection   Intravenous Anesthesia Intra-op [place] Moos, Evgenia A, CRNA   100 mg at 03/18/23 1610   vasopressin (PITRESSIN) 20 UNIT/ML injection   Intravenous Anesthesia Intra-op Caryn Bee A, CRNA   1 Units at 03/18/23 1532     ALLERGIES Ace inhibitors  MEDICAL HISTORY Past Medical History:  Diagnosis Date   Acute lower GI bleeding 06/21/2018   Acute respiratory failure with hypoxia (HCC)  01/13/2015   Arthritis    Atrial flutter (HCC)    Bile leak, postoperative 03/15/2016   Biliary dyskinesia 02/12/2016   Blind right eye    Hx: of partial blindness in right eye   Cardiomyopathy    CHF (congestive heart failure) (HCC)    Coronary artery disease    normal coronaries by 10/10/08 cath, Cardiac Cath 08-04-12 epic.Dr. Algie Coffer follows   Diabetes mellitus    pt. states he's borderline diabetic., no longer taking med,- off med. since 2013   Dialysis patient [facility])    Mon-Wed-Fri([facility])- Left AV fistula   Diverticulitis 03/2015   reoccurred in December 2016   Diverticulitis of intestine without perforation or abscess without bleeding 04/21/2015   Dizziness 11/22/2018   DVT (deep venous thrombosis) (HCC)    ESRD (end stage renal disease) (HCC)    GERD (gastroesophageal reflux disease)    Gout    Hemorrhage of left kidney 01/13/2018   History of nephrectomy 07/04/2012   History of right nephrectomy in 2002 for renal cell carcinoma    History of unilateral nephrectomy    Hypertension    Hypotension due to hypovolemia 06/04/2020   Low iron    Myocardial infarction [facility]) ?2006   Renal cell carcinoma    dialysis- MWF- [address]- Dr. Briant Cedar follows.   Renal insufficiency    Shortness of breath 05/19/2011   "at rest, lying down, w/exertion"   Stroke [facility]) 02/2011   05/19/11 denies residual   Umbilical hernia 05/19/2011   unrepaired   Wears glasses      SOCIAL HISTORY Social History   Socioeconomic History   Marital status: Married    Spouse name: Not on file   Number of children: Not on file   Years of education: Not on file   Highest education level: Not on file  Occupational History   Occupation: disability  Tobacco Use   Smoking status: Never   Smokeless tobacco: Never  Vaping Use   Vaping status: Never Used  Substance and Sexual Activity   Alcohol use: No    Alcohol/week: 0.0 standard drinks of alcohol   Drug use: Yes    Types:  Hydrocodone    Comment: Past hx-none in 20 yrs   Sexual activity: Yes  Other Topics Concern   Not on file  Social History Narrative   Not on file   Social Determinants of Health   Financial Resource Strain: Low Risk  (01/19/2023)   Overall Financial Resource Strain (CARDIA)    Difficulty of Paying Living Expenses: Not hard at all  Food Insecurity: No Food Insecurity (01/19/2023)   Hunger Vital Sign    Worried About Running Out of Food in the Last Year: Never true    Ran Out of Food in the Last Year: Never true  Transportation Needs: No Transportation Needs (01/19/2023)   PRAPARE - Administrator, Civil Service (Medical): No  Lack of Transportation (Non-Medical): No  Physical Activity: Inactive (01/19/2023)   Exercise Vital Sign    Days of Exercise per Week: 0 days    Minutes of Exercise per Session: 0 min  Stress: No Stress Concern Present (01/19/2023)   [employer] of Occupational Health - Occupational Stress Questionnaire    Feeling of Stress : Not at all  Social Connections: Moderately Integrated (01/19/2023)   Social Connection and Isolation Panel [NHANES]    Frequency of Communication with Friends and Family: More than three times a week    Frequency of Social Gatherings with Friends and Family: Once a week    Attends Religious Services: More than 4 times per year    Active Member of [employer] or Organizations: No    Attends Banker Meetings: Never    Marital Status: Married  Catering manager Violence: Not At Risk (01/19/2023)   Humiliation, Afraid, Rape, and Kick questionnaire    Fear of Current or Ex-Partner: No    Emotionally Abused: No    Physically Abused: No    Sexually Abused: No     FAMILY HISTORY Family History  Problem Relation Age of Onset   Hypertension Mother    Kidney disease Mother      Review of Systems: 12 systems were reviewed and negative except per HPI  Physical Exam: Vitals:   03/18/23 1236  BP: 133/74  Pulse: 64   Resp: 18  Temp: 98.3 F (36.8 C)  SpO2: 100%   Total I/O In: 300 [IV Piggyback:300] Out: -   Intake/Output Summary (Last 24 hours) at 03/18/2023 1630 Last data filed at 03/18/2023 1523 Gross per 24 hour  Intake 300 ml  Output --  Net 300 ml   General: drowsy, no acute distress HEENT: anicteric sclera, MMM CV: normal rate, no murmurs, no edema Lungs: bilateral chest rise, normal wob, cta b/l Abd: soft, non-tender, non-distended Skin: no visible lesions or rashes Ext: left shoulder dressing, LUE in sling Neuro: drowsy but easily arousable Dialysis access: unable to evaluate LUE AVF as arm is in sling  Test Results Reviewed Lab Results  Component Value Date   NA 135 03/18/2023   K 4.4 03/18/2023   CL 97 (L) 03/18/2023   CO2 23 03/03/2023   BUN 37 (H) 03/18/2023   CREATININE 11.10 (H) 03/18/2023   CALCIUM 9.7 03/03/2023   ALBUMIN 2.8 (L) 09/21/2022   PHOS 6.2 (H) 09/21/2022    I have reviewed relevant outside healthcare records

## 2023-03-18 NOTE — Transfer of Care (Signed)
Immediate Anesthesia Transfer of Care Note  Patient: [name]  Procedure(s) Performed: REVERSE SHOULDER ARTHROPLASTY (Left: Shoulder)  Patient Location: PACU  Anesthesia Type:General  Level of Consciousness: sedated  Airway & Oxygen Therapy: Patient Spontanous Breathing and Patient connected to face mask oxygen  Post-op Assessment: Report given to RN and Post -op Vital signs reviewed and stable  Post vital signs: Reviewed and stable  Last Vitals:  Vitals Value Taken Time  BP 130/71 03/18/23 1634  Temp    Pulse 82 03/18/23 1636  Resp 26 03/18/23 1636  SpO2 100 % 03/18/23 1636  Vitals shown include unfiled device data.  Last Pain:  Vitals:   03/18/23 1247  TempSrc:   PainSc: 0-No pain         Complications: No notable events documented.

## 2023-03-18 NOTE — Anesthesia Procedure Notes (Signed)
Procedure Name: Intubation Date/Time: 03/18/2023 2:36 PM  Performed by: Susy Manor, CRNAPre-anesthesia Checklist: Patient identified, Emergency Drugs available, Suction available and Patient being monitored Patient Re-evaluated:Patient Re-evaluated prior to induction Oxygen Delivery Method: Circle System Utilized Preoxygenation: Pre-oxygenation with 100% oxygen Induction Type: IV induction Ventilation: Mask ventilation without difficulty Laryngoscope Size: Mac and 3 Grade View: Grade II Tube type: Oral Tube size: 7.5 mm Number of attempts: 1 Airway Equipment and Method: Stylet and Oral airway Placement Confirmation: ETT inserted through vocal cords under direct vision, positive ETCO2 and breath sounds checked- equal and bilateral Tube secured with: Tape Dental Injury: Teeth and Oropharynx as per pre-operative assessment

## 2023-03-19 ENCOUNTER — Encounter (HOSPITAL_COMMUNITY): Payer: Self-pay | Admitting: Orthopedic Surgery

## 2023-03-19 DIAGNOSIS — I2510 Atherosclerotic heart disease of native coronary artery without angina pectoris: Secondary | ICD-10-CM | POA: Diagnosis not present

## 2023-03-19 DIAGNOSIS — Z8673 Personal history of transient ischemic attack (TIA), and cerebral infarction without residual deficits: Secondary | ICD-10-CM | POA: Diagnosis not present

## 2023-03-19 DIAGNOSIS — Z86718 Personal history of other venous thrombosis and embolism: Secondary | ICD-10-CM | POA: Diagnosis not present

## 2023-03-19 DIAGNOSIS — N186 End stage renal disease: Secondary | ICD-10-CM | POA: Diagnosis not present

## 2023-03-19 DIAGNOSIS — Z96612 Presence of left artificial shoulder joint: Secondary | ICD-10-CM | POA: Diagnosis not present

## 2023-03-19 DIAGNOSIS — E1122 Type 2 diabetes mellitus with diabetic chronic kidney disease: Secondary | ICD-10-CM | POA: Diagnosis not present

## 2023-03-19 DIAGNOSIS — M19012 Primary osteoarthritis, left shoulder: Secondary | ICD-10-CM | POA: Diagnosis not present

## 2023-03-19 DIAGNOSIS — I132 Hypertensive heart and chronic kidney disease with heart failure and with stage 5 chronic kidney disease, or end stage renal disease: Secondary | ICD-10-CM | POA: Diagnosis not present

## 2023-03-19 DIAGNOSIS — I509 Heart failure, unspecified: Secondary | ICD-10-CM | POA: Diagnosis not present

## 2023-03-19 DIAGNOSIS — Z992 Dependence on renal dialysis: Secondary | ICD-10-CM | POA: Diagnosis not present

## 2023-03-19 LAB — POCT I-STAT, CHEM 8
BUN: 50 mg/dL — ABNORMAL HIGH (ref 6–20)
Calcium, Ion: 1.22 mmol/L (ref 1.15–1.40)
Chloride: 99 mmol/L (ref 98–111)
Creatinine, Ser: 12.7 mg/dL — ABNORMAL HIGH (ref 0.61–1.24)
Glucose, Bld: 95 mg/dL (ref 70–99)
HCT: 45 % (ref 39.0–52.0)
Hemoglobin: 15.3 g/dL (ref 13.0–17.0)
Potassium: 5.3 mmol/L — ABNORMAL HIGH (ref 3.5–5.1)
Sodium: 134 mmol/L — ABNORMAL LOW (ref 135–145)
TCO2: 24 mmol/L (ref 22–32)

## 2023-03-19 LAB — HEMOGLOBIN AND HEMATOCRIT, BLOOD
HCT: 31.1 % — ABNORMAL LOW (ref 39.0–52.0)
Hemoglobin: 9.9 g/dL — ABNORMAL LOW (ref 13.0–17.0)

## 2023-03-19 LAB — BASIC METABOLIC PANEL
Anion gap: 14 (ref 5–15)
BUN: 46 mg/dL — ABNORMAL HIGH (ref 6–20)
CO2: 25 mmol/L (ref 22–32)
Calcium: 9.6 mg/dL (ref 8.9–10.3)
Chloride: 96 mmol/L — ABNORMAL LOW (ref 98–111)
Creatinine, Ser: 11.67 mg/dL — ABNORMAL HIGH (ref 0.61–1.24)
GFR, Estimated: 5 mL/min — ABNORMAL LOW (ref >60)
Glucose, Bld: 117 mg/dL — ABNORMAL HIGH (ref 70–99)
Potassium: 6.7 mmol/L (ref 3.5–5.1)
Sodium: 135 mmol/L (ref 135–145)

## 2023-03-19 LAB — GLUCOSE, CAPILLARY
Glucose-Capillary: 102 mg/dL — ABNORMAL HIGH (ref 70–99)
Glucose-Capillary: 122 mg/dL — ABNORMAL HIGH (ref 70–99)
Glucose-Capillary: 81 mg/dL (ref 70–99)
Glucose-Capillary: 87 mg/dL (ref 70–99)
Glucose-Capillary: 90 mg/dL (ref 70–99)

## 2023-03-19 LAB — HEPATITIS B SURFACE ANTIGEN: Hepatitis B Surface Ag: NONREACTIVE

## 2023-03-19 MED ORDER — HEPARIN SODIUM (PORCINE) 1000 UNIT/ML IJ SOLN
INTRAMUSCULAR | Status: AC
  Filled 2023-03-19: qty 4

## 2023-03-19 MED ORDER — OXYCODONE HCL 5 MG PO TABS: 5.0000 mg | ORAL_TABLET | Freq: Four times a day (QID) | ORAL | 0 refills | Status: DC | PRN

## 2023-03-19 MED ORDER — DEXTROSE 50 % IV SOLN
50.0000 mL | Freq: Once | INTRAVENOUS | Status: AC
  Administered 2023-03-19: 50 mL via INTRAVENOUS
  Filled 2023-03-19: qty 50

## 2023-03-19 MED ORDER — INSULIN ASPART 100 UNIT/ML IV SOLN
10.0000 [IU] | Freq: Once | INTRAVENOUS | Status: AC
  Administered 2023-03-19: 10 [IU] via INTRAVENOUS

## 2023-03-19 MED ORDER — CALCIUM GLUCONATE-NACL 1-0.675 GM/50ML-% IV SOLN
1.0000 g | INTRAVENOUS | Status: AC
  Administered 2023-03-19: 1000 mg via INTRAVENOUS
  Filled 2023-03-19: qty 50

## 2023-03-19 MED ORDER — SODIUM ZIRCONIUM CYCLOSILICATE 10 G PO PACK
10.0000 g | PACK | Freq: Once | ORAL | Status: AC
  Administered 2023-03-19: 10 g via ORAL
  Filled 2023-03-19: qty 1

## 2023-03-19 NOTE — Anesthesia Postprocedure Evaluation (Signed)
Anesthesia Post Note  Patient: AULTON DONEZ  Procedure(s) Performed: REVERSE SHOULDER ARTHROPLASTY (Left: Shoulder)     Patient location during evaluation: PACU Anesthesia Type: General Level of consciousness: awake and alert Pain management: pain level controlled Vital Signs Assessment: post-procedure vital signs reviewed and stable Respiratory status: spontaneous breathing, nonlabored ventilation, respiratory function stable and patient connected to nasal cannula oxygen Cardiovascular status: blood pressure returned to baseline and stable Postop Assessment: no apparent nausea or vomiting Anesthetic complications: no   No notable events documented.  Last Vitals:  Vitals:   03/19/23 0011 03/19/23 0404  BP: 114/66 (!) 155/61  Pulse: 90 77  Resp: 18 20  Temp: 37.1 C 36.6 C  SpO2: 96% 96%    Last Pain:  Vitals:   03/18/23 2110  TempSrc:   PainSc: 4                  Mariann Barter

## 2023-03-19 NOTE — Progress Notes (Signed)
On-call team contacted regarding patient being hypotensive s/p dialysis. Patient asymptomatic and states hypotension is normal for him after dialysis. Investment banker, operational received a call back from [place], MD of [facility]. MD okayed discharge home.

## 2023-03-19 NOTE — Evaluation (Signed)
Occupational Therapy Evaluation Patient Details Name: [name] MRN: [id number] DOB: August 12, 1960 Today's Date: 03/19/2023   History of Present Illness [name] is a 62 y.o. male with past medical history significant for ESRD on MWF HD, paroxysmal A-fib/flutter on Coumadin, chronic systolic CHF (EF 19-14%), chronic hypotension on midodrine, Hx of DVT, right nephrectomy. Pt is s/p left reverse total shoulder arthroplasty 03/18/2023.   Clinical Impression   PTA, pt was living at home with his wife. Pt reports he was modified independent with functional mobility at RW level. Pt reports his wife assisted him with dressing and bathing. Pt currently limited this session secondary to BP (see below) and pt returned to supine. Educated pt on shoulder precautions, exercises, compensatory strategies, etc. Due to decline in current level of function, pt would benefit from acute OT to address established goals to facilitate safe D/C to venue listed below. At this time, recommend OPOT follow-up. Will continue to follow acutely.    BP sitting 86/67 (74)  BP standing 72/38 (49) HR 161bpm (pt asymptomatic. However, less talkative)     If plan is discharge home, recommend the following: A lot of help with bathing/dressing/bathroom;A little help with walking and/or transfers    Functional Status Assessment  Patient has had a recent decline in their functional status and demonstrates the ability to make significant improvements in function in a reasonable and predictable amount of time.  Equipment Recommendations  None recommended by OT    Recommendations for Other Services       Precautions / Restrictions Precautions Precautions: Shoulder Type of Shoulder Precautions: reverse total shoulder Shoulder Interventions: Shoulder sling/immobilizer;At all times;Off for dressing/bathing/exercises Precaution Booklet Issued: Yes (comment) Precaution Comments: reviewed handout Required Braces or Orthoses:  Sling Restrictions Weight Bearing Restrictions: Yes LUE Weight Bearing: Non weight bearing Other Position/Activity Restrictions: AROM elbow, wrist, hand; AROM/PROM forward flexion 0-90;AROM/PROM external rotation 0-30;no abduction      Mobility Bed Mobility Overal bed mobility: Needs Assistance Bed Mobility: Sit to Supine       Sit to supine: Min assist, HOB elevated   General bed mobility comments: minA for trunk progression and BLE management    Transfers Overall transfer level: Needs assistance Equipment used: 1 person hand held assist Transfers: Sit to/from Stand Sit to Stand: Min assist           General transfer comment: minA to powerup into standing single hand held assistance      Balance Overall balance assessment: Mild deficits observed, not formally tested                                         ADL either performed or assessed with clinical judgement   ADL Overall ADL's : Needs assistance/impaired Eating/Feeding: Set up;Sitting   Grooming: Set up;Sitting   Upper Body Bathing: Minimal assistance;Sitting   Lower Body Bathing: Minimal assistance;Sit to/from stand   Upper Body Dressing : Moderate assistance;Sitting Upper Body Dressing Details (indicate cue type and reason): assistance for donning/doffing/repostioning sling Lower Body Dressing: Minimal assistance;Sit to/from Market researcher Details (indicate cue type and reason): deferred due to BP         Functional mobility during ADLs: Minimal assistance General ADL Comments: pt stood EOB with minA +1, pt limited by BP this session, 86/50mmHg sitting;standing BP 72/64mmHg HR 161;pt returned to supine and RN notified.  Vision         Perception         Praxis         Pertinent Vitals/Pain Pain Assessment Pain Assessment: Faces Faces Pain Scale: Hurts little more Pain Location: L shoulder Pain Descriptors / Indicators: Grimacing, Guarding Pain  Intervention(s): Limited activity within patient's tolerance, Monitored during session     Extremity/Trunk Assessment Upper Extremity Assessment Upper Extremity Assessment: RUE deficits/detail;LUE deficits/detail (limited assessment 2/2 BP) RUE Deficits / Details: pt with previous TSA in april, WFL LUE Deficits / Details: s/p reverse shoulder, able to move hand and wrist, tolerable to PROM to elbow and FF shoulder   Lower Extremity Assessment Lower Extremity Assessment: Overall WFL for tasks assessed   Cervical / Trunk Assessment Cervical / Trunk Assessment: Normal   Communication Communication Communication: No apparent difficulties   Cognition Arousal: Alert Behavior During Therapy: WFL for tasks assessed/performed Overall Cognitive Status: Within Functional Limits for tasks assessed                                       General Comments  pt limited by BP    Exercises     Shoulder Instructions      Home Living Family/patient expects to be discharged to:: Private residence Living Arrangements: Spouse/significant other;Children Available Help at Discharge: Family Type of Home: House Home Access: Ramped entrance     Home Layout: One level     Bathroom Shower/Tub: Producer, television/film/video: Standard Bathroom Accessibility: Yes How Accessible: Accessible via walker Home Equipment: Rolling Walker (2 wheels);Shower seat;Grab bars - toilet;Grab bars - tub/shower;Wheelchair - manual   Additional Comments: Pt lives at home with his wife      Prior Functioning/Environment Prior Level of Function : Needs assist       Physical Assist : ADLs (physical);Mobility (physical) Mobility (physical): Gait ADLs (physical): Dressing;Bathing Mobility Comments: uses RW ADLs Comments: wife assists with dressing and bathing and additional ADL prn        OT Problem List: Decreased activity tolerance;Decreased range of motion;Decreased strength;Decreased  safety awareness;Cardiopulmonary status limiting activity;Pain;Impaired UE functional use      OT Treatment/Interventions: Self-care/ADL training;Therapeutic exercise;DME and/or AE instruction;Patient/family education;Balance training    OT Goals(Current goals can be found in the care plan section) Acute Rehab OT Goals Patient Stated Goal: to go home OT Goal Formulation: With patient Time For Goal Achievement: 04/02/23 Potential to Achieve Goals: Good ADL Goals Pt/caregiver will Perform Home Exercise Program: Left upper extremity;With written HEP provided;Independently Additional ADL Goal #1: Pt will demonstrate independence with sling management. Additional ADL Goal #2: Pt will ambulate to the bathroom at modified independent level.  OT Frequency: Min 1X/week    Co-evaluation              AM-PAC OT "6 Clicks" Daily Activity     Outcome Measure Help from another person eating meals?: A Little Help from another person taking care of personal grooming?: A Little Help from another person toileting, which includes using toliet, bedpan, or urinal?: A Little Help from another person bathing (including washing, rinsing, drying)?: A Little Help from another person to put on and taking off regular upper body clothing?: A Lot Help from another person to put on and taking off regular lower body clothing?: A Little 6 Click Score: 17   End of Session Equipment Utilized During Treatment: Rolling walker (2  wheels);Other (comment) (sling) Nurse Communication: Mobility status;Other (comment) (BP)  Activity Tolerance: Treatment limited secondary to medical complications (Comment) (BP) Patient left: in bed;with call bell/phone within reach;with bed alarm set  OT Visit Diagnosis: Other abnormalities of gait and mobility (R26.89);Muscle weakness (generalized) (M62.81);Pain Pain - Right/Left: Left Pain - part of body: Shoulder                Time: 1914-7829 OT Time Calculation (min): 19  min Charges:  OT General Charges $OT Visit: 1 Visit OT Evaluation $OT Eval Low Complexity: 1 Low  Yenny Kosa OTR/L Acute Rehabilitation Services Office: [phone]   Providence Crosby 03/19/2023, 4:33 PM

## 2023-03-19 NOTE — Progress Notes (Signed)
PROGRESS NOTE    BUN RINNE  ZOX:[id number] DOB: [birth year] DOA: 03/18/2023 PCP: [name], FNP    Brief Narrative:   [name] is a 62 y.o. male with past medical history significant for ESRD on MWF HD, paroxysmal A-fib/flutter on Coumadin, chronic systolic CHF (EF 81-19%), chronic hypotension on midodrine, Hx of DVT, right nephrectomy who was admitted by the orthopedic service after undergoing left reverse shoulder arthroplasty.  TRH consulted for hyperglycemia.  Assessment & Plan:   Hypoglycemia: Resolved Patient noted to have low fingerstick glucose readings of 43 and 58 prior to surgery.  Patient has a prior history of diabetes but not on medical management anymore since starting dialysis.  Hypoglycemic episodes were likely due to fasting state.  Hemoglobin A1c 5.6 on 03/03/2023.  Postoperatively patient with no further hypoglycemic events. -Discontinue current sliding scale insulin orders -Continue hypoglycemia protocol as ordered -Encourage oral nutrition -Close monitoring of glucose   S/p left reverse shoulder arthroplasty 03/18/23: Postop care as per orthopedics.   ESRD on MWF HD: Hyperkalemia Potassium 6.7 this morning, received calcium gluconate, insulin, D50. Nephrology following, plan is for dialysis today   Chronic systolic CHF: Volume managed with dialysis.   -- Continue Toprol-XL, digoxin.   Chronic hypotension: -- continue midodrine.   Paroxysmal A-fib/flutter: --Continue Toprol-XL, digoxin.   --Resume Coumadin when cleared from surgical perspective.  Obesity Body mass index is 34.53 kg/m.  Complicates all facets of care   DVT prophylaxis: SCD's Start: 03/18/23 1727    Code Status: Full Code Family Communication: No family present at bedside this morning  Disposition Plan:  Level of care: Med-Surg Status is: Observation    Subjective: Patient seen examined bedside, resting calmly.  Undergoing hemodialysis this morning.  No further  hypoglycemic episodes.  Overall feels well this morning.  Hopeful for discharge home following dialysis later today.  Reports ate breakfast without issue.  Denies headache, no dizziness, no chest pain, no palpitations, no shortness of breath, no abdominal pain, no fever/chills/night sweats, no nausea cefonicid diarrhea, no focal weakness, no fatigue, no paresthesias.  No acute events overnight per nursing staff.  Objective: Vitals:   03/19/23 0940 03/19/23 0954 03/19/23 1000 03/19/23 1030  BP: 96/77  91/69 (!) 87/69  Pulse: 76  83   Resp: 18  16   Temp:      TempSrc:      SpO2: 97%  97%   Weight:  103 kg    Height:        Intake/Output Summary (Last 24 hours) at 03/19/2023 1043 Last data filed at 03/19/2023 0600 Gross per 24 hour  Intake 1255.9 ml  Output 0 ml  Net 1255.9 ml   Filed Weights   03/18/23 1236 03/19/23 0954  Weight: 111.1 kg 103 kg    Examination:  Physical Exam: GEN: NAD, alert and oriented x 3, obese HEENT: NCAT, PERRL, EOMI, sclera clear, MMM PULM: CTAB w/o wheezes/crackles, normal respiratory effort, on room air CV: RRR w/o M/G/R GI: abd soft, NTND, + BS MSK: no peripheral edema, left shoulder in sling NEURO: No focal neurological deficits PSYCH: normal mood/affect    Data Reviewed: I have personally reviewed following labs and imaging studies  CBC: Recent Labs  Lab 03/18/23 1326 03/19/23 0318 03/19/23 0930  HGB 10.9* 9.9* 15.3  HCT 32.0* 31.1* 45.0   Basic Metabolic Panel: Recent Labs  Lab 03/18/23 1326 03/19/23 0318 03/19/23 0930  NA 135 135 134*  K 4.4 6.7* 5.3*  CL 97* 96* 99  CO2  --  25  --   GLUCOSE 82 117* 95  BUN 37* 46* 50*  CREATININE 11.10* 11.67* 12.70*  CALCIUM  --  9.6  --    GFR: Estimated Creatinine Clearance: 7.3 mL/min (A) (by C-G formula based on SCr of 12.7 mg/dL (H)). Liver Function Tests: No results for input(s): "AST", "ALT", "ALKPHOS", "BILITOT", "PROT", "ALBUMIN" in the last 168 hours. No results for  input(s): "LIPASE", "AMYLASE" in the last 168 hours. No results for input(s): "AMMONIA" in the last 168 hours. Coagulation Profile: Recent Labs  Lab 03/18/23 1327  INR 1.2   Cardiac Enzymes: No results for input(s): "CKTOTAL", "CKMB", "CKMBINDEX", "TROPONINI" in the last 168 hours. BNP (last 3 results) No results for input(s): "PROBNP" in the last 8760 hours. HbA1C: No results for input(s): "HGBA1C" in the last 72 hours. CBG: Recent Labs  Lab 03/18/23 2141 03/19/23 0012 03/19/23 0359 03/19/23 0716 03/19/23 0819  GLUCAP 153* 122* 102* 81 90   Lipid Profile: No results for input(s): "CHOL", "HDL", "LDLCALC", "TRIG", "CHOLHDL", "LDLDIRECT" in the last 72 hours. Thyroid Function Tests: No results for input(s): "TSH", "T4TOTAL", "FREET4", "T3FREE", "THYROIDAB" in the last 72 hours. Anemia Panel: No results for input(s): "VITAMINB12", "FOLATE", "FERRITIN", "TIBC", "IRON", "RETICCTPCT" in the last 72 hours. Sepsis Labs: No results for input(s): "PROCALCITON", "LATICACIDVEN" in the last 168 hours.  Recent Results (from the past 240 hour(s))  Surgical pcr screen     Status: Abnormal   Collection Time: 03/10/23  1:24 PM   Specimen: Nasal Mucosa; Nasal Swab  Result Value Ref Range Status   MRSA, PCR NEGATIVE NEGATIVE Final   Staphylococcus aureus POSITIVE (A) NEGATIVE Final    Comment: (NOTE) The Xpert SA Assay (FDA approved for NASAL specimens in patients 47 years of age and older), is one component of a comprehensive surveillance program. It is not intended to diagnose infection nor to guide or monitor treatment. Performed at [facility], [address] [place], [place] [address]          Radiology Studies: DG CHEST PORT 1 VIEW  Result Date: 03/18/2023 CLINICAL DATA:  Central venous catheter placement EXAM: PORTABLE CHEST 1 VIEW COMPARISON:  05/24/2022 FINDINGS: Mild elevation of the left hemidiaphragm again noted. Left basilar opacification in keeping with  atelectasis or infiltrate within this region. Small left pleural effusion is suspected. No pneumothorax. No pleural effusion on the right. Right internal jugular central venous catheter in place with its tip overlying the expected terminal superior vena cava. Stable cardiomegaly. Pulmonary vascularity is normal. No acute bone abnormality. Bilateral total shoulder arthroplasty has been performed. IMPRESSION: 1. Right internal jugular central venous catheter in place. No pneumothorax. 2. Stable cardiomegaly. 3. Left basilar atelectasis or infiltrate. Small left pleural effusion. Electronically Signed   By: Helyn Numbers M.D.   On: 03/18/2023 23:00   DG Shoulder Left Port  Result Date: 03/18/2023 CLINICAL DATA:  Status post reverse total arthroplasty EXAM: LEFT SHOULDER COMPARISON:  None Available. FINDINGS: Status post left shoulder reverse total arthroplasty with expected overlying postoperative change. No evidence of perihardware fracture or component malpositioning. IMPRESSION: Status post left shoulder reverse total arthroplasty with expected overlying postoperative change. No evidence of perihardware fracture or component malpositioning. Electronically Signed   By: Jearld Lesch M.D.   On: 03/18/2023 20:47        Scheduled Meds:  acetaminophen  1,000 mg Oral Q6H   acidophilus  2 capsule Oral Daily   calcium acetate  2,001 mg Oral  TID WC   Chlorhexidine Gluconate Cloth  6 each Topical Q0600   dextrose  12.5 g Intravenous STAT   digoxin  0.125 mg Oral Q M,W,F   docusate sodium  100 mg Oral BID   gabapentin  300 mg Oral QHS   isosorbide dinitrate  10 mg Oral BID   metoprolol succinate  25 mg Oral Daily   midodrine  10 mg Oral TID   pantoprazole  40 mg Oral BID   sodium chloride flush  10-40 mL Intracatheter Q12H   sucroferric oxyhydroxide  1,000 mg Oral TID WC   Continuous Infusions:   ceFAZolin (ANCEF) IV       LOS: 0 days    Time spent: 50 minutes spent on chart review,  discussion with nursing staff, consultants, updating family and interview/physical exam; more than 50% of that time was spent in counseling and/or coordination of care.    Alvira Philips [place], DO [facility] Available via Epic secure chat 7am-7pm After these hours, please refer to coverage provider listed on [email or web address] 03/19/2023, 10:43 AM

## 2023-03-19 NOTE — Care Management Obs Status (Signed)
MEDICARE OBSERVATION STATUS NOTIFICATION   Patient Details  Name: EBONY SYVERTSON MRN: [id number] Date of Birth: [birth year]   Medicare Observation Status Notification Given:  Yes    Ronny Bacon, RN 03/19/2023, 8:00 AM

## 2023-03-19 NOTE — Care Plan (Signed)
Patient discharged home to self/ family care. All discharge instructions including medication regimen and follow-up appointments explained to patient. Patient left via wheelchair down to family car. All personal belongings taken with patient.

## 2023-03-19 NOTE — Progress Notes (Addendum)
Pt had hypotension. Dr. Thedore Mins notified.  03/19/23 1258  Vitals  Temp (!) 97.5 F (36.4 C)  Temp Source Oral  BP (!) 85/52  BP Location Right Arm  BP Method Automatic  Patient Position (if appropriate) Lying  Pulse Rate 79  Resp 18  During Treatment Monitoring  Blood Flow Rate (mL/min) 149 mL/min  Arterial Pressure (mmHg) -41.21 mmHg  Venous Pressure (mmHg) 307.86 mmHg  TMP (mmHg) 38.79 mmHg  Ultrafiltration Rate (mL/min) 846 mL/min  Dialysate Flow Rate (mL/min) 300 ml/min  Dialysate Potassium Concentration 2  Dialysate Calcium Concentration 2.5  Duration of HD Treatment -hour(s) 2.82 hour(s)  Cumulative Fluid Removed (mL) per Treatment  1749.89  HD Safety Checks Performed Yes  Intra-Hemodialysis Comments Tx completed (pt stated b p is always low, RN aware,)  Post Treatment  Dialyzer Clearance Clotted  Hemodialysis Intake (mL) 0 mL  Liters Processed 56  Fluid Removed (mL) 1750 mL  Tolerated HD Treatment Yes  Post-Hemodialysis Comments Pt cut off 10 mins d/t system clotted.  AVG/AVF Arterial Site Held (minutes) 10 minutes  AVG/AVF Venous Site Held (minutes) 10 minutes

## 2023-03-19 NOTE — Discharge Summary (Signed)
Patient ID: [name] MRN: [id number] DOB/AGE: 60-Jun-1962 60 y.o.  Admit date: 03/18/2023 Discharge date: 03/19/2023  Primary Diagnosis: End-stage left shoulder osteoarthritis Admission Diagnoses:  Past Medical History:  Diagnosis Date   Acute lower GI bleeding 06/21/2018   Acute respiratory failure with hypoxia (HCC) 01/13/2015   Arthritis    Atrial flutter (HCC)    Bile leak, postoperative 03/15/2016   Biliary dyskinesia 02/12/2016   Blind right eye    Hx: of partial blindness in right eye   Cardiomyopathy    CHF (congestive heart failure) (HCC)    Coronary artery disease    normal coronaries by 10/10/08 cath, Cardiac Cath 08-04-12 epic.Dr. Algie Coffer follows   Diabetes mellitus    pt. states he's borderline diabetic., no longer taking med,- off med. since 2013   Dialysis patient [facility])    Mon-Wed-Fri([facility])- Left AV fistula   Diverticulitis 03/2015   reoccurred in December 2016   Diverticulitis of intestine without perforation or abscess without bleeding 04/21/2015   Dizziness 11/22/2018   DVT (deep venous thrombosis) (HCC)    ESRD (end stage renal disease) (HCC)    GERD (gastroesophageal reflux disease)    Gout    Hemorrhage of left kidney 01/13/2018   History of nephrectomy 07/04/2012   History of right nephrectomy in 2002 for renal cell carcinoma    History of unilateral nephrectomy    Hypertension    Hypotension due to hypovolemia 06/04/2020   Low iron    Myocardial infarction [facility]) ?2006   Renal cell carcinoma    dialysis- [facility]- [address]- Dr. Briant Cedar follows.   Renal insufficiency    Shortness of breath 05/19/2011   "at rest, lying down, w/exertion"   Stroke [facility]) 02/2011   05/19/11 denies residual   Umbilical hernia 05/19/2011   unrepaired   Wears glasses    Discharge Diagnoses:   Principal Problem:   S/P reverse total shoulder arthroplasty, left  Estimated body mass index is 33.86 kg/m as calculated from the following:    Height as of this encounter: 5\' 8"  (1.727 m).   Weight as of this encounter: 101 kg.  Procedure:  Procedure(s) (LRB): REVERSE SHOULDER ARTHROPLASTY (Left)   Consults: nephrology and internal medicine  HPI: [name] admitted for postoperative care following left total shoulder arthroplasty.  He also has end-stage renal disease on hemodialysis.  Admitted for hemodialysis during hospitalization as well. Laboratory Data: Admission on 03/18/2023, Discharged on 03/19/2023  Component Date Value Ref Range Status   Glucose-Capillary 03/18/2023 43 (LL)  70 - 99 mg/dL Final   Glucose reference range applies only to samples taken after fasting for at least 8 hours.   Prothrombin Time 03/18/2023 14.9  11.4 - 15.2 seconds Final   INR 03/18/2023 1.2  0.8 - 1.2 Final   Comment: (NOTE) INR goal varies based on device and disease states. Performed at [facility], [address], [place], [place] [address]    Sodium 03/18/2023 135  135 - 145 mmol/L Final   Potassium 03/18/2023 4.4  3.5 - 5.1 mmol/L Final   Chloride 03/18/2023 97 (L)  98 - 111 mmol/L Final   BUN 03/18/2023 37 (H)  6 - 20 mg/dL Final   Creatinine, Ser 03/18/2023 11.10 (H)  0.61 - 1.24 mg/dL Final   Glucose, Bld 05/26/7251 82  70 - 99 mg/dL Final   Glucose reference range applies only to samples taken after fasting for at least 8 hours.   Calcium, Ion 03/18/2023 1.15  1.15 -  1.40 mmol/L Final   TCO2 03/18/2023 26  22 - 32 mmol/L Final   Hemoglobin 03/18/2023 10.9 (L)  13.0 - 17.0 g/dL Final   HCT 16/02/9603 32.0 (L)  39.0 - 52.0 % Final   Glucose-Capillary 03/18/2023 58 (L)  70 - 99 mg/dL Final   Glucose reference range applies only to samples taken after fasting for at least 8 hours.   Glucose-Capillary 03/18/2023 87  70 - 99 mg/dL Final   Glucose reference range applies only to samples taken after fasting for at least 8 hours.   Sodium 03/19/2023 135  135 - 145 mmol/L Final   Potassium 03/19/2023 6.7 (HH)  3.5 - 5.1 mmol/L  Final   CRITICAL RESULT CALLED TO, READ BACK BY AND VERIFIED WITH Bonnita Nasuti RN [phone] M. [address]AMANO   Chloride 03/19/2023 96 (L)  98 - 111 mmol/L Final   CO2 03/19/2023 25  22 - 32 mmol/L Final   Glucose, Bld 03/19/2023 117 (H)  70 - 99 mg/dL Final   Glucose reference range applies only to samples taken after fasting for at least 8 hours.   BUN 03/19/2023 46 (H)  6 - 20 mg/dL Final   Creatinine, Ser 03/19/2023 11.67 (H)  0.61 - 1.24 mg/dL Final   Calcium 19/14/7829 9.6  8.9 - 10.3 mg/dL Final   GFR, Estimated 03/19/2023 5 (L)  >60 mL/min Final   Comment: (NOTE) Calculated using the CKD-EPI Creatinine Equation (2021)    Anion gap 03/19/2023 14  5 - 15 Final   Performed at [facility], [address] [place], [place] [address]   Hemoglobin 03/19/2023 9.9 (L)  13.0 - 17.0 g/dL Final   HCT 08/65/7846 31.1 (L)  39.0 - 52.0 % Final   Performed at [facility], [address] [place], [place] [address]   Glucose-Capillary 03/18/2023 72  70 - 99 mg/dL Final   Glucose reference range applies only to samples taken after fasting for at least 8 hours.   Glucose-Capillary 03/18/2023 153 (H)  70 - 99 mg/dL Final   Glucose reference range applies only to samples taken after fasting for at least 8 hours.   Comment 1 03/18/2023 Notify RN   Final   Comment 2 03/18/2023 Document in Chart   Final   Glucose-Capillary 03/19/2023 122 (H)  70 - 99 mg/dL Final   Glucose reference range applies only to samples taken after fasting for at least 8 hours.   Glucose-Capillary 03/19/2023 102 (H)  70 - 99 mg/dL Final   Glucose reference range applies only to samples taken after fasting for at least 8 hours.   Glucose-Capillary 03/19/2023 81  70 - 99 mg/dL Final   Glucose reference range applies only to samples taken after fasting for at least 8 hours.   Hepatitis B Surface Ag 03/19/2023 NON REACTIVE  NON REACTIVE Final   Performed at [facility], [address] [place], [place] [address]    Glucose-Capillary 03/19/2023 90  70 - 99 mg/dL Final   Glucose reference range applies only to samples taken after fasting for at least 8 hours.   Sodium 03/19/2023 134 (L)  135 - 145 mmol/L Final   Potassium 03/19/2023 5.3 (H)  3.5 - 5.1 mmol/L Final   Chloride 03/19/2023 99  98 - 111 mmol/L Final   BUN 03/19/2023 50 (H)  6 - 20 mg/dL Final   Creatinine, Ser 03/19/2023 12.70 (H)  0.61 - 1.24 mg/dL Final   Glucose, Bld  03/19/2023 95  70 - 99 mg/dL Final   Glucose reference range applies only to samples taken after fasting for at least 8 hours.   Calcium, Ion 03/19/2023 1.22  1.15 - 1.40 mmol/L Final   TCO2 03/19/2023 24  22 - 32 mmol/L Final   Hemoglobin 03/19/2023 15.3  13.0 - 17.0 g/dL Final   HCT 16/02/9603 45.0  39.0 - 52.0 % Final   Glucose-Capillary 03/19/2023 87  70 - 99 mg/dL Final   Glucose reference range applies only to samples taken after fasting for at least 8 hours.  Hospital Outpatient Visit on 03/10/2023  Component Date Value Ref Range Status   Glucose-Capillary 03/10/2023 88  70 - 99 mg/dL Final   Glucose reference range applies only to samples taken after fasting for at least 8 hours.   MRSA, PCR 03/10/2023 NEGATIVE  NEGATIVE Final   Staphylococcus aureus 03/10/2023 POSITIVE (A)  NEGATIVE Final   Comment: (NOTE) The Xpert SA Assay (FDA approved for NASAL specimens in patients 34 years of age and older), is one component of a comprehensive surveillance program. It is not intended to diagnose infection nor to guide or monitor treatment. Performed at [facility], [address] [place], [place] [address]   Office Visit on 03/03/2023  Component Date Value Ref Range Status   WBC 03/03/2023 6.4  3.4 - 10.8 x10E3/uL Final   RBC 03/03/2023 3.96 (L)  4.14 - 5.80 x10E6/uL Final   Hemoglobin 03/03/2023 11.8 (L)  13.0 - 17.7 g/dL Final   Hematocrit 11/91/4782 36.1 (L)  37.5 - 51.0 % Final   MCV 03/03/2023 91  79 - 97 fL Final   MCH 03/03/2023 29.8  26.6 - 33.0 pg  Final   MCHC 03/03/2023 32.7  31.5 - 35.7 g/dL Final   RDW 95/62/1308 16.0 (H)  11.6 - 15.4 % Final   Platelets 03/03/2023 252  150 - 450 x10E3/uL Final   Neutrophils 03/03/2023 58  Not Estab. % Final   Lymphs 03/03/2023 29  Not Estab. % Final   Monocytes 03/03/2023 8  Not Estab. % Final   Eos 03/03/2023 3  Not Estab. % Final   Basos 03/03/2023 1  Not Estab. % Final   Neutrophils Absolute 03/03/2023 3.8  1.4 - 7.0 x10E3/uL Final   Lymphocytes Absolute 03/03/2023 1.8  0.7 - 3.1 x10E3/uL Final   Monocytes Absolute 03/03/2023 0.5  0.1 - 0.9 x10E3/uL Final   EOS (ABSOLUTE) 03/03/2023 0.2  0.0 - 0.4 x10E3/uL Final   Basophils Absolute 03/03/2023 0.1  0.0 - 0.2 x10E3/uL Final   Immature Granulocytes 03/03/2023 1  Not Estab. % Final   Immature Grans (Abs) 03/03/2023 0.0  0.0 - 0.1 x10E3/uL Final   Hgb A1c MFr Bld 03/03/2023 5.6  4.8 - 5.6 % Final   Comment:          Prediabetes: 5.7 - 6.4          Diabetes: >6.4          Glycemic control for adults with diabetes: <7.0    Est. average glucose Bld gHb Est-m* 03/03/2023 114  mg/dL Final   Glucose 65/78/4696 82  70 - 99 mg/dL Final   BUN 29/52/8413 19  6 - 24 mg/dL Final   **Verified by repeat analysis**   Creatinine, Ser 03/03/2023 7.12 (H)  0.76 - 1.27 mg/dL Final   **Verified by repeat analysis**   eGFR 03/03/2023 8 (L)  >59 mL/min/1.73 Final   BUN/Creatinine Ratio 03/03/2023 3 (L)  9 - 20 Final   Sodium 03/03/2023 137  134 - 144 mmol/L Final   Potassium 03/03/2023 5.0  3.5 - 5.2 mmol/L Final   Chloride 03/03/2023 92 (L)  96 - 106 mmol/L Final   CO2 03/03/2023 23  20 - 29 mmol/L Final   Calcium 03/03/2023 9.7  8.7 - 10.2 mg/dL Final   Cholesterol, Total 03/03/2023 204 (H)  100 - 199 mg/dL Final   Triglycerides 31/51/7616 144  0 - 149 mg/dL Final   HDL 07/37/1062 52  >39 mg/dL Final   VLDL Cholesterol Cal 03/03/2023 26  5 - 40 mg/dL Final   LDL Chol Calc (NIH) 03/03/2023 126 (H)  0 - 99 mg/dL Final   Chol/HDL Ratio 03/03/2023 3.9  0.0 -  5.0 ratio Final   Comment:                                   T. Chol/HDL Ratio                                             Men  Women                               1/2 Avg.Risk  3.4    3.3                                   Avg.Risk  5.0    4.4                                2X Avg.Risk  9.6    7.1                                3X Avg.Risk 23.4   11.0    INR 03/03/2023 2.4 (H)  0.9 - 1.2 Final   Comment: Reference interval is for non-anticoagulated patients. Suggested INR therapeutic range for Vitamin K antagonist therapy:    Standard Dose (moderate intensity                   therapeutic range):       2.0 - 3.0    Higher intensity therapeutic range       2.5 - 3.5    Prothrombin Time 03/03/2023 24.8 (H)  9.1 - 12.0 sec Final     X-Rays:DG CHEST PORT 1 VIEW  Result Date: 03/18/2023 CLINICAL DATA:  Central venous catheter placement EXAM: PORTABLE CHEST 1 VIEW COMPARISON:  05/24/2022 FINDINGS: Mild elevation of the left hemidiaphragm again noted. Left basilar opacification in keeping with atelectasis or infiltrate within this region. Small left pleural effusion is suspected. No pneumothorax. No pleural effusion on the right. Right internal jugular central venous catheter in place with its tip overlying the expected terminal superior vena cava. Stable cardiomegaly. Pulmonary vascularity is normal. No acute bone abnormality. Bilateral total shoulder arthroplasty has been performed. IMPRESSION: 1. Right internal jugular central venous catheter in place. No pneumothorax. 2. Stable cardiomegaly. 3. Left basilar atelectasis or infiltrate. Small left pleural effusion. Electronically Signed   By:  Helyn Numbers M.D.   On: 03/18/2023 23:00   DG Shoulder Left Port  Result Date: 03/18/2023 CLINICAL DATA:  Status post reverse total arthroplasty EXAM: LEFT SHOULDER COMPARISON:  None Available. FINDINGS: Status post left shoulder reverse total arthroplasty with expected overlying postoperative change. No  evidence of perihardware fracture or component malpositioning. IMPRESSION: Status post left shoulder reverse total arthroplasty with expected overlying postoperative change. No evidence of perihardware fracture or component malpositioning. Electronically Signed   By: Jearld Lesch M.D.   On: 03/18/2023 20:47    EKG: Orders placed or performed during the hospital encounter of 03/18/23   EKG 12-Lead   EKG 12-Lead     Hospital Course: JAIRUS MARTUS is a 62 y.o. who was admitted to Hospital. They were brought to the operating room on 03/18/2023 and underwent Procedure(s): REVERSE SHOULDER ARTHROPLASTY.  Patient tolerated the procedure well and was later transferred to the recovery room and then to the orthopaedic floor for postoperative care.  They were given PO and IV analgesics for pain control following their surgery.  They were given 24 hours of postoperative antibiotics of  Anti-infectives (From admission, onward)    Start     Dose/Rate Route Frequency Ordered Stop   03/19/23 1400  ceFAZolin (ANCEF) IVPB 1 g/50 mL premix        1 g 100 mL/hr over 30 Minutes Intravenous  Once 03/18/23 1733 03/19/23 1531   03/19/23 0600  ceFAZolin (ANCEF) IVPB 2g/100 mL premix  Status:  Discontinued        2 g 200 mL/hr over 30 Minutes Intravenous On call to O.R. 03/18/23 1226 03/18/23 1721   03/18/23 1815  ceFAZolin (ANCEF) IVPB 1 g/50 mL premix  Status:  Discontinued        1 g 100 mL/hr over 30 Minutes Intravenous Every 6 hours 03/18/23 1726 03/18/23 1733   03/18/23 1605  vancomycin (VANCOCIN) powder  Status:  Discontinued          As needed 03/18/23 1606 03/18/23 1631      and started on DVT prophylaxis in the form of Aspirin.    OT was ordered for total joint protocol.  He received hemodialysis on postoperative day #1.  He did have some issues with hypoglycemia overnight postoperative day #0.  Internal medicine was consulted for recommendations and ultimately this normalized with some fluids and ability  to tolerate p.o.  After hemodialysis he was a bit soft with blood pressure and we monitored this.  He was able to discharge home uneventful.  Diet: Renal diet Activity:PWB Follow-up:in 2 weeks Disposition - Home Discharged Condition: good   Discharge Instructions     Call MD / Call 911   Complete by: As directed    If you experience chest pain or shortness of breath, CALL 911 and be transported to the hospital emergency room.  If you develope a fever above 101 F, pus (white drainage) or increased drainage or redness at the wound, or calf pain, call your surgeon's office.   Constipation Prevention   Complete by: As directed    Drink plenty of fluids.  Prune juice may be helpful.  You may use a stool softener, such as Colace (over the counter) 100 mg twice a day.  Use MiraLax (over the counter) for constipation as needed.   Diet - low sodium heart healthy   Complete by: As directed    Increase activity slowly as tolerated   Complete by: As directed    Post-operative  opioid taper instructions:   Complete by: As directed    POST-OPERATIVE OPIOID TAPER INSTRUCTIONS: It is important to wean off of your opioid medication as soon as possible. If you do not need pain medication after your surgery it is ok to stop day one. Opioids include: Codeine, Hydrocodone(Norco, Vicodin), Oxycodone(Percocet, oxycontin) and hydromorphone amongst others.  Long term and even short term use of opiods can cause: Increased pain response Dependence Constipation Depression Respiratory depression And more.  Withdrawal symptoms can include Flu like symptoms Nausea, vomiting And more Techniques to manage these symptoms Hydrate well Eat regular healthy meals Stay active Use relaxation techniques(deep breathing, meditating, yoga) Do Not substitute Alcohol to help with tapering If you have been on opioids for less than two weeks and do not have pain than it is ok to stop all together.  Plan to wean off of  opioids This plan should start within one week post op of your joint replacement. Maintain the same interval or time between taking each dose and first decrease the dose.  Cut the total daily intake of opioids by one tablet each day Next start to increase the time between doses. The last dose that should be eliminated is the evening dose.         Allergies as of 03/19/2023       Reactions   Ace Inhibitors Itching, Cough, Other (See Comments)        Medication List     TAKE these medications    Acidophilus/Bifidus Wafr Take 1 Wafer by mouth daily.   albuterol 108 (90 Base) MCG/ACT inhaler Commonly known as: VENTOLIN HFA Inhale 2 puffs into the lungs every 6 (six) hours as needed for wheezing or shortness of breath.   calcium acetate 667 MG capsule Commonly known as: PHOSLO Take 1-3 capsules (667-2,001 mg total) by mouth See admin instructions. Take 2001 mg capsules with meals three times daily and 667 mg capsule with snacks.   chlorhexidine 0.12 % solution Commonly known as: PERIDEX 15 mLs by Mouth Rinse route 2 (two) times daily.   digoxin 0.125 MG tablet Commonly known as: LANOXIN Take 1 tablet (0.125 mg total) by mouth every Monday, Wednesday, and Friday.   DuoDERM CGF Extra Thin Misc Apply 1 each topically daily as needed.   feeding supplement (NEPRO CARB STEADY) Liqd Take 237 mLs by mouth daily.   gabapentin 300 MG capsule Commonly known as: NEURONTIN Take 1 capsule (300 mg total) by mouth at bedtime.   glucose blood test strip Use to check blood sugar 3 times a day. Dx code e11.65   HYDROcodone-acetaminophen 10-325 MG tablet Commonly known as: NORCO Take 0.5 tablets by mouth 2 (two) times daily as needed for moderate pain.   HYDROcodone-acetaminophen 7.5-325 MG tablet Commonly known as: NORCO Take 1 tablet by mouth every 6 (six) hours as needed for moderate pain.   isosorbide dinitrate 10 MG tablet Commonly known as: ISORDIL Take 10 mg by mouth 2  (two) times daily.   lidocaine 5 % Commonly known as: Lidoderm Place 1 patch onto the skin daily. Remove & Discard patch within 12 hours or as directed by MD   loperamide 2 MG tablet Commonly known as: IMODIUM A-D Take 1 tablet (2 mg total) by mouth 4 (four) times daily as needed for diarrhea or loose stools. Also available OTC.   meclizine 25 MG tablet Commonly known as: ANTIVERT Take 1 tablet (25 mg total) by mouth 3 (three) times daily as needed for dizziness.  metoprolol succinate 25 MG 24 hr tablet Commonly known as: TOPROL-XL Take 1 tablet (25 mg total) by mouth daily.   midodrine 10 MG tablet Commonly known as: PROAMATINE Take 1 tablet (10 mg total) by mouth 3 (three) times daily.   multivitamin Tabs tablet Take 1 tablet by mouth at bedtime.   nitroGLYCERIN 0.4 MG SL tablet Commonly known as: NITROSTAT Place 0.4 mg under the tongue every 5 (five) minutes as needed for chest pain.   ondansetron 4 MG disintegrating tablet Commonly known as: Zofran ODT Take 1 tablet (4 mg total) by mouth every 8 (eight) hours as needed for nausea or vomiting.   OneTouch Delica Plus Lancet33G Misc Use to check blood sugar 3 times a day. Dx code e11.65   oxyCODONE 5 MG immediate release tablet Commonly known as: Roxicodone Take 1 tablet (5 mg total) by mouth every 6 (six) hours as needed for moderate pain (pain score 4-6) or severe pain (pain score 7-10).   pantoprazole 40 MG tablet Commonly known as: PROTONIX Take 40 mg by mouth 2 (two) times daily.   PARSABIV IV   UNABLE TO FIND Out patient physical therapy- vestibular therapy for BPPV   Velphoro 500 MG chewable tablet Generic drug: sucroferric oxyhydroxide Chew 500-1,000 mg by mouth See admin instructions. Takes 2 tablets with each meal and 1 tablet with snacks.   warfarin 7.5 MG tablet Commonly known as: COUMADIN Take 7.5 mg by mouth daily.        Follow-up Information     Yolonda Kida, MD Follow up in 2  week(s).   Specialty: Orthopedic Surgery Why: For wound re-check Contact information: [address] [place] [address] [phone]                 Signed: Maryan Rued, MD Orthopaedic Surgery 03/19/2023, 7:15 PM

## 2023-03-19 NOTE — Progress Notes (Signed)
OT Cancellation Note  Patient Details Name: [name] MRN: [id number] DOB: 02-28-1961   Cancelled Treatment:    Reason Eval/Treat Not Completed: Patient at procedure or test/ unavailable Pt currently off the unit at HD. OT will return as time allows and pt is appropriate.    Providence Crosby 03/19/2023, 9:40 AM

## 2023-03-19 NOTE — Procedures (Signed)
I was present at this dialysis session. I have reviewed the session itself and made appropriate changes.   Filed Weights   03/18/23 1236 03/19/23 0954  Weight: 111.1 kg 103 kg    Recent Labs  Lab 03/19/23 0318 03/19/23 0930  NA 135 134*  K 6.7* 5.3*  CL 96* 99  CO2 25  --   GLUCOSE 117* 95  BUN 46* 50*  CREATININE 11.67* 12.70*  CALCIUM 9.6  --     Recent Labs  Lab 03/18/23 1326 03/19/23 0318 03/19/23 0930  HGB 10.9* 9.9* 15.3  HCT 32.0* 31.1* 45.0    Scheduled Meds:  acetaminophen  1,000 mg Oral Q6H   acidophilus  2 capsule Oral Daily   calcium acetate  2,001 mg Oral TID WC   Chlorhexidine Gluconate Cloth  6 each Topical Q0600   dextrose  12.5 g Intravenous STAT   digoxin  0.125 mg Oral Q M,W,F   docusate sodium  100 mg Oral BID   gabapentin  300 mg Oral QHS   isosorbide dinitrate  10 mg Oral BID   metoprolol succinate  25 mg Oral Daily   midodrine  10 mg Oral TID   pantoprazole  40 mg Oral BID   sodium chloride flush  10-40 mL Intracatheter Q12H   sucroferric oxyhydroxide  1,000 mg Oral TID WC   Continuous Infusions:   ceFAZolin (ANCEF) IV     PRN Meds:.acetaminophen, albuterol, calcium acetate, dextrose, HYDROmorphone (DILAUDID) injection, loperamide, meclizine, melatonin, menthol-cetylpyridinium **OR** phenol, metoCLOPramide **OR** metoCLOPramide (REGLAN) injection, nitroGLYCERIN, ondansetron **OR** ondansetron (ZOFRAN) IV, oxyCODONE, oxyCODONE, sodium chloride flush, sucroferric oxyhydroxide   Anthony Sar, MD [facility] 03/19/2023, 11:02 AM

## 2023-03-19 NOTE — Progress Notes (Signed)
[facility] Progress Note    Assessment/ Plan:   ESRD  -outpatient HD orders: [facility].  4 hours.  LUE AVF.  15-gauge needles.  Flow rates: 450/600.  EDW 105.8.  2K/2.5 calcium.  Heparin: 4000 units bolus.  Meds: Parsabiv 15 mg with dialysis, Hectorol 8 mcg with dialysis -HD today, back on MWF on 10/28   Left shoulder osteoarthritis -Left reverse shoulder arthroplasty 10/25.  Per Ortho   Volume/ hypertension  -UF as tolerated   Anemia of Chronic Kidney Disease -Hemoglobin 9.9 -Transfuse PRN for Hgb <7   Secondary Hyperparathyroidism/Hyperphosphatemia - resume home binders   Hyperkalemia -attempting to manage with HD  Subjective:   Hyperkalemic on labs this am, was not informed Patient seen and examined on HD. He reports that he does not feel like he has a lot of fluid on board. Tolerating treatment, no complaints   Objective:   BP (!) 87/69 (BP Location: Right Arm)   Pulse 83   Temp 98 F (36.7 C) (Oral)   Resp 16   Ht 5\' 8"  (1.727 m)   Wt 103 kg   SpO2 97%   BMI 34.53 kg/m   Intake/Output Summary (Last 24 hours) at 03/19/2023 1105 Last data filed at 03/19/2023 0600 Gross per 24 hour  Intake 1255.9 ml  Output 0 ml  Net 1255.9 ml   Weight change:   Physical Exam: Gen: NAD CVS: RRR Resp: CTA B/L Abd: soft Ext: no sig edema b/l Les Neuro: awake, alert Dialysis access: LUE AVF +b/t  Imaging: DG CHEST PORT 1 VIEW  Result Date: 03/18/2023 CLINICAL DATA:  Central venous catheter placement EXAM: PORTABLE CHEST 1 VIEW COMPARISON:  05/24/2022 FINDINGS: Mild elevation of the left hemidiaphragm again noted. Left basilar opacification in keeping with atelectasis or infiltrate within this region. Small left pleural effusion is suspected. No pneumothorax. No pleural effusion on the right. Right internal jugular central venous catheter in place with its tip overlying the expected terminal superior vena cava. Stable cardiomegaly. Pulmonary vascularity  is normal. No acute bone abnormality. Bilateral total shoulder arthroplasty has been performed. IMPRESSION: 1. Right internal jugular central venous catheter in place. No pneumothorax. 2. Stable cardiomegaly. 3. Left basilar atelectasis or infiltrate. Small left pleural effusion. Electronically Signed   By: Helyn Numbers M.D.   On: 03/18/2023 23:00   DG Shoulder Left Port  Result Date: 03/18/2023 CLINICAL DATA:  Status post reverse total arthroplasty EXAM: LEFT SHOULDER COMPARISON:  None Available. FINDINGS: Status post left shoulder reverse total arthroplasty with expected overlying postoperative change. No evidence of perihardware fracture or component malpositioning. IMPRESSION: Status post left shoulder reverse total arthroplasty with expected overlying postoperative change. No evidence of perihardware fracture or component malpositioning. Electronically Signed   By: Jearld Lesch M.D.   On: 03/18/2023 20:47    Labs: BMET Recent Labs  Lab 03/18/23 1326 03/19/23 0318 03/19/23 0930  NA 135 135 134*  K 4.4 6.7* 5.3*  CL 97* 96* 99  CO2  --  25  --   GLUCOSE 82 117* 95  BUN 37* 46* 50*  CREATININE 11.10* 11.67* 12.70*  CALCIUM  --  9.6  --    CBC Recent Labs  Lab 03/18/23 1326 03/19/23 0318 03/19/23 0930  HGB 10.9* 9.9* 15.3  HCT 32.0* 31.1* 45.0    Medications:     acetaminophen  1,000 mg Oral Q6H   acidophilus  2 capsule Oral Daily   calcium acetate  2,001 mg Oral TID WC  Chlorhexidine Gluconate Cloth  6 each Topical Q0600   dextrose  12.5 g Intravenous STAT   digoxin  0.125 mg Oral Q M,W,F   docusate sodium  100 mg Oral BID   gabapentin  300 mg Oral QHS   isosorbide dinitrate  10 mg Oral BID   metoprolol succinate  25 mg Oral Daily   midodrine  10 mg Oral TID   pantoprazole  40 mg Oral BID   sodium chloride flush  10-40 mL Intracatheter Q12H   sucroferric oxyhydroxide  1,000 mg Oral TID WC      Anthony Sar, MD [facility] 03/19/2023, 11:05 AM

## 2023-03-19 NOTE — Progress Notes (Signed)
   Subjective:  Patient reports pain as mild to moderate.  Did not rest well overnight.  Doing much better today and eating breakfast.  States he feels hungry this morning.  Objective:   VITALS:   Vitals:   03/18/23 2008 03/19/23 0011 03/19/23 0404 03/19/23 0732  BP: 135/78 114/66 (!) 155/61 93/65  Pulse: 84 90 77 74  Resp: 20 18 20 19   Temp: 98.4 F (36.9 C) 98.7 F (37.1 C) 97.9 F (36.6 C) (!) 97.5 F (36.4 C)  TempSrc: Oral   Oral  SpO2: 100% 96% 96% 96%  Weight:      Height:        Neurologically intact Neurovascular intact Sensation intact distally Incision: dressing C/D/I Sling in place Still some numbness along the shoulder consistent with interscalene block  Lab Results  Component Value Date   WBC 6.4 03/03/2023   HGB 9.9 (L) 03/19/2023   HCT 31.1 (L) 03/19/2023   MCV 91 03/03/2023   PLT 252 03/03/2023   BMET    Component Value Date/Time   NA 135 03/19/2023 0318   NA 137 03/03/2023 1242   K 6.7 (HH) 03/19/2023 0318   CL 96 (L) 03/19/2023 0318   CO2 25 03/19/2023 0318   GLUCOSE 117 (H) 03/19/2023 0318   BUN 46 (H) 03/19/2023 0318   BUN 19 03/03/2023 1242   CREATININE 11.67 (H) 03/19/2023 0318   CALCIUM 9.6 03/19/2023 0318   EGFR 8 (L) 03/03/2023 1242   GFRNONAA 5 (L) 03/19/2023 0318     Assessment/Plan: 1 Day Post-Op   Principal Problem:   S/P reverse total shoulder arthroplasty, left   Advance diet  -Appreciate the hospitalist service for peaking on him last evening.  He was not feeling very well and seems to be doing much better today.  -0 plan for hemodialysis this morning.  -Will anticipate discharge after dialysis this afternoon.  Follow-up with me in 2 weeks.   Yolonda Kida 03/19/2023, 8:31 AM   Maryan Rued, MD [phone]

## 2023-03-19 NOTE — Progress Notes (Signed)
Date and time results received: 03/19/23 0400  (use smartphrase ".now" to insert current time)  Test: potassium Critical Value: 6.7  Name of Provider Notified: Anthoney Harada, NP  Orders Received? Or Actions Taken?: Orders Received - See Orders for details

## 2023-03-19 NOTE — Progress Notes (Signed)
Patient Name: [name]           DOB: 02-12-1961  MRN: [id number]      Admission Date: 03/18/2023  Attending Provider: Yolonda Kida, MD  Primary Diagnosis: S/P reverse total shoulder arthroplasty, left   Level of care: Med-Surg    CROSS COVER NOTE   Date of Service   03/19/2023   [name], 62 y.o. male, was admitted on 03/18/2023 for S/P reverse total shoulder arthroplasty, left.    HPI/Events of Note   Hyperkalemia- 6.7 ESRD on MWF HD-last HD session was on Wednesday, plans for dialysis on 10/26 Given onset of hyperkalemia, place patient on cardiac telemetry Will give calcium gluconate, and D50 followed by IV insulin to treat hyperkalemia.  Adding on Lokelma.   Interventions/ Plan   Above        Anthoney Harada, DNP, [facility] [place]

## 2023-03-19 NOTE — Plan of Care (Signed)
  Problem: Pain Management: Goal: Pain level will decrease with appropriate interventions Outcome: Progressing   Problem: Education: Goal: Knowledge of General Education information will improve Description: Including pain rating scale, medication(s)/side effects and non-pharmacologic comfort measures Outcome: Progressing   Problem: Activity: Goal: Risk for activity intolerance will decrease Outcome: Progressing

## 2023-03-19 NOTE — Discharge Planning (Signed)
[facility] Patient Discharge Orders - [facility] CLINIC: [facility]  Patient's name: [name] Admit/DC Dates: 03/18/2023 - 03/19/23  DISCHARGE DIAGNOSES: L shoulder arthritis s/p L reverse shoulder arthroplasty 10/25  ESRD - s/p HD on 10/26 Hypoglycemia (pre-op) - better after able to eat  HD ORDER CHANGES: Heparin change: no EDW Change: no Bath Change: no  ANEMIA MANAGEMENT: Aranesp: Given: no ESA dose for discharge: None, per protocol IV Iron dose at discharge: per protocol Transfusion: Given: no  BONE/MINERAL MEDICATIONS: Hectorol/Calcitriol change: no Sensipar/Parsabiv change: no  ACCESS INTERVENTION/CHANGE: no Details:  RECENT LABS: Recent Labs  Lab 03/19/23 0318 03/19/23 0930  HGB 9.9* 15.3  NA 135 134*  K 6.7* 5.3*  CALCIUM 9.6  --    IV ANTIBIOTICS: no Details:  OTHER ANTICOAGULATION: On Coumadin?: no  OTHER/APPTS/LAB ORDERS:  - Will have ortho f/u in 2 weeks  - Ok to be out of sling while on HD - try to prop his arm  D/C Meds to be reconciled by nurse after every discharge.  Completed By: Ozzie Hoyle, PA-C [facility] Pager [phone]   Reviewed by: MD:______ RN_______

## 2023-03-21 DIAGNOSIS — Z992 Dependence on renal dialysis: Secondary | ICD-10-CM | POA: Diagnosis not present

## 2023-03-21 DIAGNOSIS — N186 End stage renal disease: Secondary | ICD-10-CM | POA: Diagnosis not present

## 2023-03-21 DIAGNOSIS — N2581 Secondary hyperparathyroidism of renal origin: Secondary | ICD-10-CM | POA: Diagnosis not present

## 2023-03-21 NOTE — Progress Notes (Signed)
Late Note Entry- March 21, 2023  Pt was d/c on Saturday. Contacted [facility] this morning to advise clinic of pt's d/c date and that pt should resume care today.   Olivia Canter Renal Navigator [phone]

## 2023-03-22 LAB — HEPATITIS B SURFACE ANTIBODY, QUANTITATIVE: Hep B S AB Quant (Post): <3.5 m[IU]/mL — ABNORMAL LOW

## 2023-03-23 DIAGNOSIS — N2581 Secondary hyperparathyroidism of renal origin: Secondary | ICD-10-CM | POA: Diagnosis not present

## 2023-03-23 DIAGNOSIS — N186 End stage renal disease: Secondary | ICD-10-CM | POA: Diagnosis not present

## 2023-03-23 DIAGNOSIS — Z992 Dependence on renal dialysis: Secondary | ICD-10-CM | POA: Diagnosis not present

## 2023-03-24 DIAGNOSIS — Z992 Dependence on renal dialysis: Secondary | ICD-10-CM | POA: Diagnosis not present

## 2023-03-24 DIAGNOSIS — I120 Hypertensive chronic kidney disease with stage 5 chronic kidney disease or end stage renal disease: Secondary | ICD-10-CM | POA: Diagnosis not present

## 2023-03-24 DIAGNOSIS — N2581 Secondary hyperparathyroidism of renal origin: Secondary | ICD-10-CM | POA: Diagnosis not present

## 2023-03-24 DIAGNOSIS — N186 End stage renal disease: Secondary | ICD-10-CM | POA: Diagnosis not present

## 2023-03-28 DIAGNOSIS — N2581 Secondary hyperparathyroidism of renal origin: Secondary | ICD-10-CM | POA: Diagnosis not present

## 2023-03-28 DIAGNOSIS — Z992 Dependence on renal dialysis: Secondary | ICD-10-CM | POA: Diagnosis not present

## 2023-03-28 DIAGNOSIS — N186 End stage renal disease: Secondary | ICD-10-CM | POA: Diagnosis not present

## 2023-03-30 DIAGNOSIS — N186 End stage renal disease: Secondary | ICD-10-CM | POA: Diagnosis not present

## 2023-03-30 DIAGNOSIS — N2581 Secondary hyperparathyroidism of renal origin: Secondary | ICD-10-CM | POA: Diagnosis not present

## 2023-03-30 DIAGNOSIS — Z992 Dependence on renal dialysis: Secondary | ICD-10-CM | POA: Diagnosis not present

## 2023-04-01 DIAGNOSIS — N186 End stage renal disease: Secondary | ICD-10-CM | POA: Diagnosis not present

## 2023-04-01 DIAGNOSIS — Z992 Dependence on renal dialysis: Secondary | ICD-10-CM | POA: Diagnosis not present

## 2023-04-01 DIAGNOSIS — N2581 Secondary hyperparathyroidism of renal origin: Secondary | ICD-10-CM | POA: Diagnosis not present

## 2023-04-04 DIAGNOSIS — Z992 Dependence on renal dialysis: Secondary | ICD-10-CM | POA: Diagnosis not present

## 2023-04-04 DIAGNOSIS — N2581 Secondary hyperparathyroidism of renal origin: Secondary | ICD-10-CM | POA: Diagnosis not present

## 2023-04-04 DIAGNOSIS — N186 End stage renal disease: Secondary | ICD-10-CM | POA: Diagnosis not present

## 2023-04-05 ENCOUNTER — Encounter: Payer: Self-pay | Admitting: Podiatry

## 2023-04-05 ENCOUNTER — Ambulatory Visit (INDEPENDENT_AMBULATORY_CARE_PROVIDER_SITE_OTHER): Payer: Medicare PPO | Admitting: Podiatry

## 2023-04-05 DIAGNOSIS — Z7901 Long term (current) use of anticoagulants: Secondary | ICD-10-CM | POA: Diagnosis not present

## 2023-04-05 DIAGNOSIS — M79674 Pain in right toe(s): Secondary | ICD-10-CM | POA: Diagnosis not present

## 2023-04-05 DIAGNOSIS — B351 Tinea unguium: Secondary | ICD-10-CM | POA: Diagnosis not present

## 2023-04-05 DIAGNOSIS — M79675 Pain in left toe(s): Secondary | ICD-10-CM

## 2023-04-05 DIAGNOSIS — E119 Type 2 diabetes mellitus without complications: Secondary | ICD-10-CM

## 2023-04-05 DIAGNOSIS — M170 Bilateral primary osteoarthritis of knee: Secondary | ICD-10-CM | POA: Diagnosis not present

## 2023-04-05 DIAGNOSIS — Z4789 Encounter for other orthopedic aftercare: Secondary | ICD-10-CM | POA: Diagnosis not present

## 2023-04-05 NOTE — Progress Notes (Signed)
This patient returns to my office for at risk foot care.  This patient requires this care by a professional since this patient will be at risk due to having DM, chronic anticoagulation and ESRD.  This patient is unable to cut nails himself since the patient cannot reach his nails.These nails are painful walking and wearing shoes.  This patient presents for at risk foot care today.  General Appearance  Alert, conversant and in no acute stress.  Vascular  Dorsalis pedis and posterior tibial  pulses are palpable  bilaterally.  Capillary return is within normal limits  bilaterally. Temperature is within normal limits  bilaterally.  Neurologic  Senn-Weinstein monofilament wire test within normal limits  bilaterally. Muscle power within normal limits bilaterally.  Nails Thick disfigured discolored nails with subungual debris  from hallux to fifth toes bilaterally. No evidence of bacterial infection or drainage bilaterally.  Orthopedic  No limitations of motion  feet .  No crepitus or effusions noted.  No bony pathology or digital deformities noted.  Skin  normotropic skin with no porokeratosis noted bilaterally.  No signs of infections or ulcers noted.     Onychomycosis  Pain in right toes  Pain in left toes  Consent was obtained for treatment procedures.   Mechanical debridement of nails 1-5  bilaterally performed with a nail nipper.  Filed with dremel without incident.    Return office visit     3 months                 Told patient to return for periodic foot care and evaluation due to potential at risk complications.   Gardiner Barefoot DPM

## 2023-04-06 DIAGNOSIS — N2581 Secondary hyperparathyroidism of renal origin: Secondary | ICD-10-CM | POA: Diagnosis not present

## 2023-04-06 DIAGNOSIS — Z992 Dependence on renal dialysis: Secondary | ICD-10-CM | POA: Diagnosis not present

## 2023-04-06 DIAGNOSIS — N186 End stage renal disease: Secondary | ICD-10-CM | POA: Diagnosis not present

## 2023-04-07 ENCOUNTER — Ambulatory Visit: Payer: Medicare HMO | Admitting: Podiatry

## 2023-04-08 DIAGNOSIS — N186 End stage renal disease: Secondary | ICD-10-CM | POA: Diagnosis not present

## 2023-04-08 DIAGNOSIS — Z992 Dependence on renal dialysis: Secondary | ICD-10-CM | POA: Diagnosis not present

## 2023-04-08 DIAGNOSIS — N2581 Secondary hyperparathyroidism of renal origin: Secondary | ICD-10-CM | POA: Diagnosis not present

## 2023-04-11 DIAGNOSIS — N186 End stage renal disease: Secondary | ICD-10-CM | POA: Diagnosis not present

## 2023-04-11 DIAGNOSIS — Z992 Dependence on renal dialysis: Secondary | ICD-10-CM | POA: Diagnosis not present

## 2023-04-11 DIAGNOSIS — N2581 Secondary hyperparathyroidism of renal origin: Secondary | ICD-10-CM | POA: Diagnosis not present

## 2023-04-13 DIAGNOSIS — N186 End stage renal disease: Secondary | ICD-10-CM | POA: Diagnosis not present

## 2023-04-13 DIAGNOSIS — Z992 Dependence on renal dialysis: Secondary | ICD-10-CM | POA: Diagnosis not present

## 2023-04-13 DIAGNOSIS — N2581 Secondary hyperparathyroidism of renal origin: Secondary | ICD-10-CM | POA: Diagnosis not present

## 2023-04-15 DIAGNOSIS — N2581 Secondary hyperparathyroidism of renal origin: Secondary | ICD-10-CM | POA: Diagnosis not present

## 2023-04-15 DIAGNOSIS — N186 End stage renal disease: Secondary | ICD-10-CM | POA: Diagnosis not present

## 2023-04-15 DIAGNOSIS — Z992 Dependence on renal dialysis: Secondary | ICD-10-CM | POA: Diagnosis not present

## 2023-04-17 DIAGNOSIS — N2581 Secondary hyperparathyroidism of renal origin: Secondary | ICD-10-CM | POA: Diagnosis not present

## 2023-04-17 DIAGNOSIS — N186 End stage renal disease: Secondary | ICD-10-CM | POA: Diagnosis not present

## 2023-04-17 DIAGNOSIS — Z992 Dependence on renal dialysis: Secondary | ICD-10-CM | POA: Diagnosis not present

## 2023-04-19 DIAGNOSIS — N186 End stage renal disease: Secondary | ICD-10-CM | POA: Diagnosis not present

## 2023-04-19 DIAGNOSIS — N2581 Secondary hyperparathyroidism of renal origin: Secondary | ICD-10-CM | POA: Diagnosis not present

## 2023-04-19 DIAGNOSIS — Z992 Dependence on renal dialysis: Secondary | ICD-10-CM | POA: Diagnosis not present

## 2023-04-22 DIAGNOSIS — Z992 Dependence on renal dialysis: Secondary | ICD-10-CM | POA: Diagnosis not present

## 2023-04-22 DIAGNOSIS — N2581 Secondary hyperparathyroidism of renal origin: Secondary | ICD-10-CM | POA: Diagnosis not present

## 2023-04-22 DIAGNOSIS — N186 End stage renal disease: Secondary | ICD-10-CM | POA: Diagnosis not present

## 2023-04-23 DIAGNOSIS — N186 End stage renal disease: Secondary | ICD-10-CM | POA: Diagnosis not present

## 2023-04-23 DIAGNOSIS — I120 Hypertensive chronic kidney disease with stage 5 chronic kidney disease or end stage renal disease: Secondary | ICD-10-CM | POA: Diagnosis not present

## 2023-04-23 DIAGNOSIS — Z992 Dependence on renal dialysis: Secondary | ICD-10-CM | POA: Diagnosis not present

## 2023-04-25 DIAGNOSIS — N186 End stage renal disease: Secondary | ICD-10-CM | POA: Diagnosis not present

## 2023-04-25 DIAGNOSIS — N2581 Secondary hyperparathyroidism of renal origin: Secondary | ICD-10-CM | POA: Diagnosis not present

## 2023-04-25 DIAGNOSIS — Z992 Dependence on renal dialysis: Secondary | ICD-10-CM | POA: Diagnosis not present

## 2023-04-27 DIAGNOSIS — Z992 Dependence on renal dialysis: Secondary | ICD-10-CM | POA: Diagnosis not present

## 2023-04-27 DIAGNOSIS — N2581 Secondary hyperparathyroidism of renal origin: Secondary | ICD-10-CM | POA: Diagnosis not present

## 2023-04-27 DIAGNOSIS — N186 End stage renal disease: Secondary | ICD-10-CM | POA: Diagnosis not present

## 2023-04-29 DIAGNOSIS — N2581 Secondary hyperparathyroidism of renal origin: Secondary | ICD-10-CM | POA: Diagnosis not present

## 2023-04-29 DIAGNOSIS — Z992 Dependence on renal dialysis: Secondary | ICD-10-CM | POA: Diagnosis not present

## 2023-04-29 DIAGNOSIS — N186 End stage renal disease: Secondary | ICD-10-CM | POA: Diagnosis not present

## 2023-05-02 DIAGNOSIS — Z992 Dependence on renal dialysis: Secondary | ICD-10-CM | POA: Diagnosis not present

## 2023-05-02 DIAGNOSIS — N2581 Secondary hyperparathyroidism of renal origin: Secondary | ICD-10-CM | POA: Diagnosis not present

## 2023-05-02 DIAGNOSIS — N186 End stage renal disease: Secondary | ICD-10-CM | POA: Diagnosis not present

## 2023-05-03 DIAGNOSIS — M170 Bilateral primary osteoarthritis of knee: Secondary | ICD-10-CM | POA: Diagnosis not present

## 2023-05-03 DIAGNOSIS — Z4789 Encounter for other orthopedic aftercare: Secondary | ICD-10-CM | POA: Diagnosis not present

## 2023-05-04 DIAGNOSIS — Z992 Dependence on renal dialysis: Secondary | ICD-10-CM | POA: Diagnosis not present

## 2023-05-04 DIAGNOSIS — N2581 Secondary hyperparathyroidism of renal origin: Secondary | ICD-10-CM | POA: Diagnosis not present

## 2023-05-04 DIAGNOSIS — N186 End stage renal disease: Secondary | ICD-10-CM | POA: Diagnosis not present

## 2023-05-06 DIAGNOSIS — Z992 Dependence on renal dialysis: Secondary | ICD-10-CM | POA: Diagnosis not present

## 2023-05-06 DIAGNOSIS — N2581 Secondary hyperparathyroidism of renal origin: Secondary | ICD-10-CM | POA: Diagnosis not present

## 2023-05-06 DIAGNOSIS — N186 End stage renal disease: Secondary | ICD-10-CM | POA: Diagnosis not present

## 2023-05-09 DIAGNOSIS — Z992 Dependence on renal dialysis: Secondary | ICD-10-CM | POA: Diagnosis not present

## 2023-05-09 DIAGNOSIS — N2581 Secondary hyperparathyroidism of renal origin: Secondary | ICD-10-CM | POA: Diagnosis not present

## 2023-05-09 DIAGNOSIS — N186 End stage renal disease: Secondary | ICD-10-CM | POA: Diagnosis not present

## 2023-05-11 DIAGNOSIS — Z992 Dependence on renal dialysis: Secondary | ICD-10-CM | POA: Diagnosis not present

## 2023-05-11 DIAGNOSIS — N186 End stage renal disease: Secondary | ICD-10-CM | POA: Diagnosis not present

## 2023-05-11 DIAGNOSIS — N2581 Secondary hyperparathyroidism of renal origin: Secondary | ICD-10-CM | POA: Diagnosis not present

## 2023-05-13 DIAGNOSIS — N2581 Secondary hyperparathyroidism of renal origin: Secondary | ICD-10-CM | POA: Diagnosis not present

## 2023-05-13 DIAGNOSIS — N186 End stage renal disease: Secondary | ICD-10-CM | POA: Diagnosis not present

## 2023-05-13 DIAGNOSIS — Z992 Dependence on renal dialysis: Secondary | ICD-10-CM | POA: Diagnosis not present

## 2023-05-15 DIAGNOSIS — Z992 Dependence on renal dialysis: Secondary | ICD-10-CM | POA: Diagnosis not present

## 2023-05-15 DIAGNOSIS — N186 End stage renal disease: Secondary | ICD-10-CM | POA: Diagnosis not present

## 2023-05-15 DIAGNOSIS — N2581 Secondary hyperparathyroidism of renal origin: Secondary | ICD-10-CM | POA: Diagnosis not present

## 2023-05-17 DIAGNOSIS — Z992 Dependence on renal dialysis: Secondary | ICD-10-CM | POA: Diagnosis not present

## 2023-05-17 DIAGNOSIS — N2581 Secondary hyperparathyroidism of renal origin: Secondary | ICD-10-CM | POA: Diagnosis not present

## 2023-05-17 DIAGNOSIS — N186 End stage renal disease: Secondary | ICD-10-CM | POA: Diagnosis not present

## 2023-05-20 DIAGNOSIS — N2581 Secondary hyperparathyroidism of renal origin: Secondary | ICD-10-CM | POA: Diagnosis not present

## 2023-05-20 DIAGNOSIS — Z992 Dependence on renal dialysis: Secondary | ICD-10-CM | POA: Diagnosis not present

## 2023-05-20 DIAGNOSIS — N186 End stage renal disease: Secondary | ICD-10-CM | POA: Diagnosis not present

## 2023-05-22 DIAGNOSIS — N2581 Secondary hyperparathyroidism of renal origin: Secondary | ICD-10-CM | POA: Diagnosis not present

## 2023-05-22 DIAGNOSIS — N186 End stage renal disease: Secondary | ICD-10-CM | POA: Diagnosis not present

## 2023-05-22 DIAGNOSIS — Z992 Dependence on renal dialysis: Secondary | ICD-10-CM | POA: Diagnosis not present

## 2023-05-24 DIAGNOSIS — N186 End stage renal disease: Secondary | ICD-10-CM | POA: Diagnosis not present

## 2023-05-24 DIAGNOSIS — N2581 Secondary hyperparathyroidism of renal origin: Secondary | ICD-10-CM | POA: Diagnosis not present

## 2023-05-24 DIAGNOSIS — Z992 Dependence on renal dialysis: Secondary | ICD-10-CM | POA: Diagnosis not present

## 2023-05-24 DIAGNOSIS — I120 Hypertensive chronic kidney disease with stage 5 chronic kidney disease or end stage renal disease: Secondary | ICD-10-CM | POA: Diagnosis not present

## 2023-05-27 DIAGNOSIS — N2581 Secondary hyperparathyroidism of renal origin: Secondary | ICD-10-CM | POA: Diagnosis not present

## 2023-05-27 DIAGNOSIS — N186 End stage renal disease: Secondary | ICD-10-CM | POA: Diagnosis not present

## 2023-05-27 DIAGNOSIS — Z992 Dependence on renal dialysis: Secondary | ICD-10-CM | POA: Diagnosis not present

## 2023-05-29 DIAGNOSIS — I480 Paroxysmal atrial fibrillation: Secondary | ICD-10-CM | POA: Diagnosis not present

## 2023-05-29 DIAGNOSIS — Z7901 Long term (current) use of anticoagulants: Secondary | ICD-10-CM | POA: Diagnosis not present

## 2023-05-30 DIAGNOSIS — N2581 Secondary hyperparathyroidism of renal origin: Secondary | ICD-10-CM | POA: Diagnosis not present

## 2023-05-30 DIAGNOSIS — N186 End stage renal disease: Secondary | ICD-10-CM | POA: Diagnosis not present

## 2023-05-30 DIAGNOSIS — Z992 Dependence on renal dialysis: Secondary | ICD-10-CM | POA: Diagnosis not present

## 2023-06-01 DIAGNOSIS — N186 End stage renal disease: Secondary | ICD-10-CM | POA: Diagnosis not present

## 2023-06-01 DIAGNOSIS — Z992 Dependence on renal dialysis: Secondary | ICD-10-CM | POA: Diagnosis not present

## 2023-06-01 DIAGNOSIS — N2581 Secondary hyperparathyroidism of renal origin: Secondary | ICD-10-CM | POA: Diagnosis not present

## 2023-06-02 DIAGNOSIS — N186 End stage renal disease: Secondary | ICD-10-CM | POA: Diagnosis not present

## 2023-06-02 DIAGNOSIS — R5383 Other fatigue: Secondary | ICD-10-CM | POA: Diagnosis not present

## 2023-06-02 DIAGNOSIS — R072 Precordial pain: Secondary | ICD-10-CM | POA: Diagnosis not present

## 2023-06-02 DIAGNOSIS — I5022 Chronic systolic (congestive) heart failure: Secondary | ICD-10-CM | POA: Diagnosis not present

## 2023-06-02 DIAGNOSIS — I2510 Atherosclerotic heart disease of native coronary artery without angina pectoris: Secondary | ICD-10-CM | POA: Diagnosis not present

## 2023-06-03 DIAGNOSIS — Z992 Dependence on renal dialysis: Secondary | ICD-10-CM | POA: Diagnosis not present

## 2023-06-03 DIAGNOSIS — N2581 Secondary hyperparathyroidism of renal origin: Secondary | ICD-10-CM | POA: Diagnosis not present

## 2023-06-03 DIAGNOSIS — N186 End stage renal disease: Secondary | ICD-10-CM | POA: Diagnosis not present

## 2023-06-06 DIAGNOSIS — N186 End stage renal disease: Secondary | ICD-10-CM | POA: Diagnosis not present

## 2023-06-06 DIAGNOSIS — N2581 Secondary hyperparathyroidism of renal origin: Secondary | ICD-10-CM | POA: Diagnosis not present

## 2023-06-06 DIAGNOSIS — Z992 Dependence on renal dialysis: Secondary | ICD-10-CM | POA: Diagnosis not present

## 2023-06-08 DIAGNOSIS — Z992 Dependence on renal dialysis: Secondary | ICD-10-CM | POA: Diagnosis not present

## 2023-06-08 DIAGNOSIS — N2581 Secondary hyperparathyroidism of renal origin: Secondary | ICD-10-CM | POA: Diagnosis not present

## 2023-06-08 DIAGNOSIS — N186 End stage renal disease: Secondary | ICD-10-CM | POA: Diagnosis not present

## 2023-06-10 DIAGNOSIS — Z992 Dependence on renal dialysis: Secondary | ICD-10-CM | POA: Diagnosis not present

## 2023-06-10 DIAGNOSIS — N2581 Secondary hyperparathyroidism of renal origin: Secondary | ICD-10-CM | POA: Diagnosis not present

## 2023-06-10 DIAGNOSIS — N186 End stage renal disease: Secondary | ICD-10-CM | POA: Diagnosis not present

## 2023-06-13 DIAGNOSIS — N186 End stage renal disease: Secondary | ICD-10-CM | POA: Diagnosis not present

## 2023-06-13 DIAGNOSIS — N2581 Secondary hyperparathyroidism of renal origin: Secondary | ICD-10-CM | POA: Diagnosis not present

## 2023-06-13 DIAGNOSIS — Z992 Dependence on renal dialysis: Secondary | ICD-10-CM | POA: Diagnosis not present

## 2023-06-15 DIAGNOSIS — Z992 Dependence on renal dialysis: Secondary | ICD-10-CM | POA: Diagnosis not present

## 2023-06-15 DIAGNOSIS — N186 End stage renal disease: Secondary | ICD-10-CM | POA: Diagnosis not present

## 2023-06-15 DIAGNOSIS — N2581 Secondary hyperparathyroidism of renal origin: Secondary | ICD-10-CM | POA: Diagnosis not present

## 2023-06-17 ENCOUNTER — Encounter: Payer: Self-pay | Admitting: Nurse Practitioner

## 2023-06-17 DIAGNOSIS — N186 End stage renal disease: Secondary | ICD-10-CM | POA: Diagnosis not present

## 2023-06-17 DIAGNOSIS — Z992 Dependence on renal dialysis: Secondary | ICD-10-CM | POA: Diagnosis not present

## 2023-06-17 DIAGNOSIS — N2581 Secondary hyperparathyroidism of renal origin: Secondary | ICD-10-CM | POA: Diagnosis not present

## 2023-06-20 DIAGNOSIS — N186 End stage renal disease: Secondary | ICD-10-CM | POA: Diagnosis not present

## 2023-06-20 DIAGNOSIS — N2581 Secondary hyperparathyroidism of renal origin: Secondary | ICD-10-CM | POA: Diagnosis not present

## 2023-06-20 DIAGNOSIS — Z992 Dependence on renal dialysis: Secondary | ICD-10-CM | POA: Diagnosis not present

## 2023-06-21 ENCOUNTER — Other Ambulatory Visit: Payer: Self-pay | Admitting: Nurse Practitioner

## 2023-06-21 DIAGNOSIS — E1122 Type 2 diabetes mellitus with diabetic chronic kidney disease: Secondary | ICD-10-CM

## 2023-06-22 DIAGNOSIS — N186 End stage renal disease: Secondary | ICD-10-CM | POA: Diagnosis not present

## 2023-06-22 DIAGNOSIS — N2581 Secondary hyperparathyroidism of renal origin: Secondary | ICD-10-CM | POA: Diagnosis not present

## 2023-06-22 DIAGNOSIS — Z992 Dependence on renal dialysis: Secondary | ICD-10-CM | POA: Diagnosis not present

## 2023-06-24 DIAGNOSIS — N186 End stage renal disease: Secondary | ICD-10-CM | POA: Diagnosis not present

## 2023-06-24 DIAGNOSIS — I120 Hypertensive chronic kidney disease with stage 5 chronic kidney disease or end stage renal disease: Secondary | ICD-10-CM | POA: Diagnosis not present

## 2023-06-24 DIAGNOSIS — Z992 Dependence on renal dialysis: Secondary | ICD-10-CM | POA: Diagnosis not present

## 2023-06-24 DIAGNOSIS — N2581 Secondary hyperparathyroidism of renal origin: Secondary | ICD-10-CM | POA: Diagnosis not present

## 2023-06-27 DIAGNOSIS — N2581 Secondary hyperparathyroidism of renal origin: Secondary | ICD-10-CM | POA: Diagnosis not present

## 2023-06-27 DIAGNOSIS — Z992 Dependence on renal dialysis: Secondary | ICD-10-CM | POA: Diagnosis not present

## 2023-06-27 DIAGNOSIS — N186 End stage renal disease: Secondary | ICD-10-CM | POA: Diagnosis not present

## 2023-06-28 DIAGNOSIS — N2581 Secondary hyperparathyroidism of renal origin: Secondary | ICD-10-CM | POA: Diagnosis not present

## 2023-06-28 DIAGNOSIS — N186 End stage renal disease: Secondary | ICD-10-CM | POA: Diagnosis not present

## 2023-06-28 DIAGNOSIS — M170 Bilateral primary osteoarthritis of knee: Secondary | ICD-10-CM | POA: Diagnosis not present

## 2023-06-28 DIAGNOSIS — Z992 Dependence on renal dialysis: Secondary | ICD-10-CM | POA: Diagnosis not present

## 2023-06-29 DIAGNOSIS — N186 End stage renal disease: Secondary | ICD-10-CM | POA: Diagnosis not present

## 2023-06-29 DIAGNOSIS — N2581 Secondary hyperparathyroidism of renal origin: Secondary | ICD-10-CM | POA: Diagnosis not present

## 2023-06-29 DIAGNOSIS — Z992 Dependence on renal dialysis: Secondary | ICD-10-CM | POA: Diagnosis not present

## 2023-06-30 ENCOUNTER — Ambulatory Visit (HOSPITAL_COMMUNITY)
Admission: AD | Admit: 2023-06-30 | Discharge: 2023-06-30 | Disposition: A | Discharge status: Home / Self Care | Payer: Medicare PPO | Source: Ambulatory Visit | Attending: Nephrology | Admitting: Nephrology

## 2023-06-30 ENCOUNTER — Other Ambulatory Visit: Payer: Self-pay

## 2023-06-30 ENCOUNTER — Encounter (HOSPITAL_COMMUNITY)
Admission: AD | Disposition: A | Discharge status: Home / Self Care | Payer: Self-pay | Source: Ambulatory Visit | Attending: Nephrology

## 2023-06-30 DIAGNOSIS — Y832 Surgical operation with anastomosis, bypass or graft as the cause of abnormal reaction of the patient, or of later complication, without mention of misadventure at the time of the procedure: Secondary | ICD-10-CM | POA: Diagnosis not present

## 2023-06-30 DIAGNOSIS — M19012 Primary osteoarthritis, left shoulder: Secondary | ICD-10-CM | POA: Insufficient documentation

## 2023-06-30 DIAGNOSIS — I509 Heart failure, unspecified: Secondary | ICD-10-CM | POA: Diagnosis not present

## 2023-06-30 DIAGNOSIS — I132 Hypertensive heart and chronic kidney disease with heart failure and with stage 5 chronic kidney disease, or end stage renal disease: Secondary | ICD-10-CM | POA: Diagnosis not present

## 2023-06-30 DIAGNOSIS — T82858A Stenosis of vascular prosthetic devices, implants and grafts, initial encounter: Secondary | ICD-10-CM | POA: Insufficient documentation

## 2023-06-30 DIAGNOSIS — E1122 Type 2 diabetes mellitus with diabetic chronic kidney disease: Secondary | ICD-10-CM | POA: Diagnosis not present

## 2023-06-30 DIAGNOSIS — N250 Renal osteodystrophy: Secondary | ICD-10-CM | POA: Diagnosis not present

## 2023-06-30 DIAGNOSIS — Z905 Acquired absence of kidney: Secondary | ICD-10-CM | POA: Insufficient documentation

## 2023-06-30 DIAGNOSIS — Z85528 Personal history of other malignant neoplasm of kidney: Secondary | ICD-10-CM | POA: Insufficient documentation

## 2023-06-30 DIAGNOSIS — D631 Anemia in chronic kidney disease: Secondary | ICD-10-CM | POA: Insufficient documentation

## 2023-06-30 DIAGNOSIS — H5461 Unqualified visual loss, right eye, normal vision left eye: Secondary | ICD-10-CM | POA: Insufficient documentation

## 2023-06-30 DIAGNOSIS — I871 Compression of vein: Secondary | ICD-10-CM | POA: Diagnosis not present

## 2023-06-30 DIAGNOSIS — I2510 Atherosclerotic heart disease of native coronary artery without angina pectoris: Secondary | ICD-10-CM | POA: Insufficient documentation

## 2023-06-30 DIAGNOSIS — N186 End stage renal disease: Secondary | ICD-10-CM | POA: Insufficient documentation

## 2023-06-30 DIAGNOSIS — Z992 Dependence on renal dialysis: Secondary | ICD-10-CM | POA: Diagnosis not present

## 2023-06-30 HISTORY — PX: PERIPHERAL VASCULAR BALLOON ANGIOPLASTY: CATH118281

## 2023-06-30 HISTORY — PX: A/V FISTULAGRAM: CATH118298

## 2023-06-30 SURGERY — A/V FISTULAGRAM
Anesthesia: LOCAL

## 2023-06-30 MED ORDER — SODIUM CHLORIDE 0.9% FLUSH: 3.0000 mL | INTRAVENOUS | Status: DC | PRN

## 2023-06-30 MED ORDER — HEPARIN (PORCINE) IN NACL 1000-0.9 UT/500ML-% IV SOLN
INTRAVENOUS | Status: DC | PRN
  Administered 2023-06-30: 500 mL

## 2023-06-30 MED ORDER — FENTANYL CITRATE (PF) 100 MCG/2ML IJ SOLN
INTRAMUSCULAR | Status: DC | PRN
  Administered 2023-06-30: 25 ug via INTRAVENOUS

## 2023-06-30 MED ORDER — MIDAZOLAM HCL 2 MG/2ML IJ SOLN
INTRAMUSCULAR | Status: AC
  Filled 2023-06-30: qty 2

## 2023-06-30 MED ORDER — FENTANYL CITRATE (PF) 100 MCG/2ML IJ SOLN
INTRAMUSCULAR | Status: AC
  Filled 2023-06-30: qty 2

## 2023-06-30 MED ORDER — IODIXANOL 320 MG/ML IV SOLN
INTRAVENOUS | Status: DC | PRN
  Administered 2023-06-30: 3 mL

## 2023-06-30 MED ORDER — ONDANSETRON HCL 4 MG/2ML IJ SOLN: 4.0000 mg | Freq: Four times a day (QID) | INTRAMUSCULAR | Status: DC | PRN

## 2023-06-30 MED ORDER — MIDAZOLAM HCL 2 MG/2ML IJ SOLN
INTRAMUSCULAR | Status: DC | PRN
  Administered 2023-06-30: 1 mg via INTRAVENOUS

## 2023-06-30 MED ORDER — ACETAMINOPHEN 325 MG PO TABS
650.0000 mg | ORAL_TABLET | ORAL | Status: DC | PRN
Start: 2023-06-30 — End: 2023-06-30

## 2023-06-30 MED ORDER — SODIUM CHLORIDE 0.9 % IV SOLN: INTRAVENOUS | Status: DC

## 2023-06-30 MED ORDER — LIDOCAINE HCL (PF) 1 % IJ SOLN
INTRAMUSCULAR | Status: AC
  Filled 2023-06-30: qty 30

## 2023-06-30 MED ORDER — LIDOCAINE HCL (PF) 1 % IJ SOLN
INTRAMUSCULAR | Status: DC | PRN
  Administered 2023-06-30: 2 mL via INTRADERMAL

## 2023-06-30 SURGICAL SUPPLY — 9 items
BAG SNAP BAND KOVER 36X36 (MISCELLANEOUS) ×2 IMPLANT
BALLN MUSTANG 9X40X75 (BALLOONS) ×2
BALLOON MUSTANG 9X40X75 (BALLOONS) IMPLANT
CATH ANGIO 5F BER2 65CM (CATHETERS) IMPLANT
COVER DOME SNAP 22 D (MISCELLANEOUS) ×2 IMPLANT
SHEATH PINNACLE R/O II 6F 4CM (SHEATH) IMPLANT
SYR MEDALLION 10ML (SYRINGE) IMPLANT
TRAY PV CATH (CUSTOM PROCEDURE TRAY) ×2 IMPLANT
WIRE BENTSON .035X145CM (WIRE) IMPLANT

## 2023-06-30 NOTE — Op Note (Signed)
 Patient presents for concerns of difficult cannulation in his left [name] which was placed by Dr. Eliza on June 26, 2021.  Patient has had inflow 7 mm balloon angioplasty as well as 5 mm arterial anastomosis angioplasty last on August 04, 2022.SABRA    On the physical exam, the fistula is hyperpulsatile at the inflow.  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  There may be occlusion in the subclavian vein as there was a large collateral bypassing it.  Would be better visualized with a selective catheterization and central venogram.   2)      Inflow anastomosis was patent and surprisingly the outflow cephalic in the forearm was patent as well although heavily calcific.   3)      This left [name] remains amenable to future percutaneous intervention as long as it remains patent at least 3 months.  Description of procedure: The arm was prepped and draped in the usual sterile fashion. The left forearm [name] fistula was cannulated (63097) with an 18G Angiocath needle directed in a retrograde direction in venous limb of the fistula. A guidewire was inserted and exchanged for a 6 Fr sheath. Contrast [id number] injection via the side port of the sheath was performed. The angiogram of the fistula (63097) showed a patent outflow body of the cephalic fistula, dual upper arm drainage, arch, axillary vein, centrals.  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  The angled Glidewire was advanced and manipulated until the tip of the wire was in the proximal radial artery.  Arteriogram revealed a patent radial artery, arterial anastomosis and a inflow limb 60-70% cephalic vein stenosis approximately 5  inches above the anastomosis. A 9x4 Mustang angioplasty balloon was then inserted over the guidewire and positioned at the cephalic vein inflow stenosis.   Venous angioplasty (63097) was carried out to 15-18 ATM with FULL effacement of the waist on the balloon at the cephalic vein lesion site. The repeat angiogram showed 10% residual stenosis with no evidence of extravasation or dissection.    The fistula including the aneurysmal section was now much less pulsatile.  Hemostasis: A 3-0 ethilon purse string suture was placed at the cannulation site on removal of the sheath.  Sedation: 2 mg Versed , 25 mcg Fentanyl . Sedation time: 20  minutes  Contrast. 3  mL  Monitoring: Because of the patient's comorbid conditions and sedation during the procedure, continuous EKG monitoring and O2 saturation monitoring was performed throughout the procedure by the RN. There were no abnormal arrhythmias encountered.  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 Continue to cannulate the fistula with 15G needles.  2. Refer for problems with flows/swelling. 3. Remove the suture next treatment.   Discharge: The patient was discharged home in stable condition. The patient was given education regarding the care of the dialysis access AVF and specific instructions in case of any problems.

## 2023-06-30 NOTE — Discharge Instructions (Signed)

## 2023-06-30 NOTE — H&P (Signed)
 Chief Complaint: Decreased flows  Interval H&P  The patient has presented today for an angiogram/ angioplasty.  Various methods of treatment have been discussed with the patient.  After consideration of risk, benefits and other options for treatment, the patient has consented to a angiogram/ angioplasty with  possible stent placement.   Risks of angiogram with potential angioplasty and stenting if needed.contrast reaction, extravasation/ bleeding, dissection, hypotension and death were explained to the patient.  The patient's history has been reviewed and the patient has been examined, no changes in status.  Stable for angiogram/angioplasty  I have reviewed the patient's chart and labs.  Questions were answered to the patient's satisfaction.  Outpatient HD orders: [facility].  4 hours.  Lt [name].  15-gauge needles.  Flow rates: 450/600.  EDW 105.8.  2K/2.5 calcium .  Heparin : 4000 units bolus.  Meds: Parsabiv 15 mg with dialysis, Hectorol  8 mcg with dialysis  Assessment/Plan: ESRD dialyzing at  [facility] MWF. Decreased access flows in left [name]- planning on angiogram with possibly angioplasty. Renal osteodystrophy - continue binders per home regimen. Anemia - managed with ESA's and IV iron  at dialysis center. HTN - resume home regimen.   HPI: [name] is an 63 y.o. male with left shoulder osteoarthritis, hypertension, history of hyperkalemia, right eye blindness, coronary atherosclerotic heart disease, renal cell carcinoma with right nephrectomy in 2002, diverticulitis, incisional disease being dialyzed Mondays Wednesdays and Fridays to his left arm AV fistula.  ROS Per HPI.  Chemistry and CBC: Creatinine, Ser  Date/Time Value Ref Range Status  03/19/2023 09:30 AM 12.70 (H) 0.61 - 1.24 mg/dL Final  89/73/7975 96:81 AM 11.67 (H) 0.61 - 1.24 mg/dL Final  89/74/7975 98:73 PM 11.10 (H) 0.61 - 1.24 mg/dL Final  89/89/7975 87:57 PM 7.12 (H) 0.76 - 1.27 mg/dL Final    Comment:     **Verified by repeat analysis**  09/21/2022 06:57 AM 10.54 (H) 0.61 - 1.24 mg/dL Final  95/70/7975 88:67 AM 12.10 (H) 0.61 - 1.24 mg/dL Final  95/76/7975 87:99 PM 7.55 (H) 0.61 - 1.24 mg/dL Final  98/97/7975 87:66 AM 8.92 (H) 0.61 - 1.24 mg/dL Final  98/98/7975 88:92 PM 4.29 (H) 0.61 - 1.24 mg/dL Final  89/75/7976 89:81 AM 8.98 (H) 0.76 - 1.27 mg/dL Final  93/79/7976 89:78 AM 8.53 (H) 0.76 - 1.27 mg/dL Final  88/83/7977 89:46 AM 5.79 (H) 0.61 - 1.24 mg/dL Final  98/84/7977 96:42 AM 7.15 (H) 0.61 - 1.24 mg/dL Final  98/85/7977 96:98 PM 10.32 (H) 0.61 - 1.24 mg/dL Final  98/85/7977 94:46 AM 9.61 (H) 0.61 - 1.24 mg/dL Final  98/86/7977 93:86 AM 7.28 (H) 0.61 - 1.24 mg/dL Final  98/87/7977 94:58 PM 6.38 (H) 0.61 - 1.24 mg/dL Final  87/96/7978 96:99 PM 11.44 (H) 0.61 - 1.24 mg/dL Final  87/97/7978 88:79 PM 9.90 (H) 0.61 - 1.24 mg/dL Final  93/75/7978 89:87 AM 10.06 (H) 0.76 - 1.27 mg/dL Final    Comment:    **Verified by repeat analysis**  03/15/2019 09:00 AM 11.10 (H) 0.61 - 1.24 mg/dL Final  91/71/7979 93:68 AM 12.44 (H) 0.61 - 1.24 mg/dL Final  91/72/7979 98:99 PM 10.12 (H) 0.61 - 1.24 mg/dL Final  92/97/7979 96:51 AM 8.53 (H) 0.61 - 1.24 mg/dL Final  92/98/7979 92:97 PM 7.36 (H) 0.61 - 1.24 mg/dL Final  93/74/7979 97:64 PM 9.94 (H) 0.61 - 1.24 mg/dL Final  96/74/7979 91:62 AM 11.78 (H) 0.61 - 1.24 mg/dL Final  96/75/7979 95:72 PM 10.38 (H) 0.61 - 1.24 mg/dL Final  96/89/7979 92:44 AM  15.04 (H) 0.61 - 1.24 mg/dL Final  96/89/7979 95:61 AM 14.87 (H) 0.61 - 1.24 mg/dL Final  96/90/7979 92:94 AM 12.77 (H) 0.61 - 1.24 mg/dL Final  97/83/7979 95:53 AM 7.64 (H) 0.61 - 1.24 mg/dL Final  98/70/7979 93:45 AM 12.04 (H) 0.61 - 1.24 mg/dL Final  98/71/7979 97:72 PM 10.61 (H) 0.61 - 1.24 mg/dL Final  90/79/7980 90:47 AM 9.62 (H) 0.61 - 1.24 mg/dL Final  90/81/7980 91:89 AM 11.38 (H) 0.61 - 1.24 mg/dL Final  90/83/7980 91:57 AM 12.64 (H) 0.61 - 1.24 mg/dL Final  90/84/7980 93:62 AM 10.16 (H) 0.61  - 1.24 mg/dL Final  90/85/7980 93:71 AM 7.80 (H) 0.61 - 1.24 mg/dL Final  90/86/7980 87:74 PM 8.84 (H) 0.61 - 1.24 mg/dL Final  90/88/7980 92:66 AM 10.79 (H) 0.61 - 1.24 mg/dL Final  90/89/7980 95:63 AM 8.22 (H) 0.61 - 1.24 mg/dL Final  90/90/7980 95:42 PM 6.79 (H) 0.61 - 1.24 mg/dL Final  90/90/7980 92:66 AM 13.13 (H) 0.61 - 1.24 mg/dL Final  90/92/7980 97:43 AM 7.59 (H) 0.61 - 1.24 mg/dL Final  90/93/7980 93:43 PM 9.10 (H) 0.61 - 1.24 mg/dL Final  90/94/7980 93:87 PM 8.91 (H) 0.61 - 1.24 mg/dL Final  90/97/7980 93:70 AM 12.60 (H) 0.61 - 1.24 mg/dL Final  90/98/7980 91:46 AM 10.56 (H) 0.61 - 1.24 mg/dL Final  91/68/7980 90:63 AM 7.46 (H) 0.61 - 1.24 mg/dL Final  91/69/7980 92:98 AM 6.67 (H) 0.61 - 1.24 mg/dL Final  91/70/7980 92:99 AM 8.48 (H) 0.61 - 1.24 mg/dL Final   No results for input(s): NA, K, CL, CO2, GLUCOSE, BUN, CREATININE, CALCIUM , PHOS in the last 168 hours.  Invalid input(s): ALB No results for input(s): WBC, NEUTROABS, HGB, HCT, MCV, PLT in the last 168 hours. Liver Function Tests: No results for input(s): AST, ALT, ALKPHOS, BILITOT, PROT, ALBUMIN  in the last 168 hours. No results for input(s): LIPASE, AMYLASE in the last 168 hours. No results for input(s): AMMONIA in the last 168 hours. Cardiac Enzymes: No results for input(s): CKTOTAL, CKMB, CKMBINDEX, TROPONINI in the last 168 hours. Iron  Studies: No results for input(s): IRON , TIBC, TRANSFERRIN, FERRITIN in the last 72 hours. PT/INR: @LABRCNTIP (inr:5)  Xrays/Other Studies: )No results found for this or any previous visit (from the past 48 hours). No results found.  PMH:   Past Medical History:  Diagnosis Date   Acute lower GI bleeding 06/21/2018   Acute respiratory failure with hypoxia (HCC) 01/13/2015   Arthritis    Atrial flutter (HCC)    Bile leak, postoperative 03/15/2016   Biliary dyskinesia 02/12/2016   Blind right eye    Hx: of  partial blindness in right eye   Cardiomyopathy    CHF (congestive heart failure) (HCC)    Coronary artery disease    normal coronaries by 10/10/08 cath, Cardiac Cath 08-04-12 epic.Dr. Claudene follows   Diabetes mellitus    pt. states he's borderline diabetic., no longer taking med,- off med. since 2013   Dialysis patient [facility])    Mon-Wed-Fri([facility])- Left AV fistula   Diverticulitis 03/2015   reoccurred in December 2016   Diverticulitis of intestine without perforation or abscess without bleeding 04/21/2015   Dizziness 11/22/2018   DVT (deep venous thrombosis) (HCC)    ESRD (end stage renal disease) (HCC)    GERD (gastroesophageal reflux disease)    Gout    Hemorrhage of left kidney 01/13/2018   History of nephrectomy 07/04/2012   History of right nephrectomy in 2002 for renal cell carcinoma  History of unilateral nephrectomy    Hypertension    Hypotension due to hypovolemia 06/04/2020   Low iron     Myocardial infarction [facility]) ?2006   Renal cell carcinoma    dialysis- MWF- [address]- Dr. Froylan follows.   Renal insufficiency    Shortness of breath 05/19/2011   at rest, lying down, w/exertion   Stroke [facility]) 02/2011   05/19/11 denies residual   Umbilical hernia 05/19/2011   unrepaired   Wears glasses     PSH:   Past Surgical History:  Procedure Laterality Date   ANAL FISTULOTOMY  08/15/2018   AV FISTULA PLACEMENT  03/29/2011   Procedure: ARTERIOVENOUS (AV) FISTULA CREATION;  Surgeon: Redell Lurena Door, MD;  Location: [facility] OR;  Service: Vascular;  Laterality: Left;  LEFT RADIOCEPHALIC , Arteriovenous [phone])   CARDIAC CATHETERIZATION  05/21/11   CARDIAC CATHETERIZATION N/A 03/16/2016   Procedure: Left Heart Cath and Coronary Angiography;  Surgeon: Salena Negri, MD;  Location: [facility] INVASIVE CV LAB;  Service: Cardiovascular;  Laterality: N/A;   CHOLECYSTECTOMY N/A 02/12/2016   Procedure: LAPAROSCOPIC CHOLECYSTECTOMY WITH INTRAOPERATIVE  CHOLANGIOGRAM;  Surgeon: Krystal Russell, MD;  Location: [facility] OR;  Service: General;  Laterality: N/A;   COLONOSCOPY WITH PROPOFOL  N/A 02/01/2017   Procedure: COLONOSCOPY WITH PROPOFOL ;  Surgeon: Kristie Lamprey, MD;  Location: [facility] ENDOSCOPY;  Service: Endoscopy;  Laterality: N/A;   dialysis cath placed     ESOPHAGOGASTRODUODENOSCOPY (EGD) WITH PROPOFOL  N/A 12/02/2015   Procedure: ESOPHAGOGASTRODUODENOSCOPY (EGD) WITH PROPOFOL ;  Surgeon: Lamprey Kristie, MD;  Location: [facility] ENDOSCOPY;  Service: Endoscopy;  Laterality: N/A;   EVALUATION UNDER ANESTHESIA WITH HEMORRHOIDECTOMY N/A 08/15/2018   Procedure: HEMORRHOID LIGATION/PEXY;  Surgeon: Sheldon Standing, MD;  Location: [facility] OR;  Service: General;  Laterality: N/A;   FINGER SURGERY     L pinkie finger- ORIF- /w remaining hardware - 1990's     FISTULOTOMY N/A 08/15/2018   Procedure: SUPERFICIAL ANAL FISTULOTOMY;  Surgeon: Sheldon Standing, MD;  Location: [facility] OR;  Service: General;  Laterality: N/A;   HEMORRHOID SURGERY     HERNIA REPAIR N/A 07/04/2012   Umbilical hernia repair   INSERTION OF DIALYSIS CATHETER N/A 01/27/2016   Procedure: INSERTION OF DIALYSIS CATHETER;  Surgeon: Penne Lonni Colorado, MD;  Location: [facility] OR;  Service: Vascular;  Laterality: N/A;   INSERTION OF MESH N/A 07/04/2012   Procedure: INSERTION OF MESH;  Surgeon: Redell Faith, DO;  Location: [facility] OR;  Service: General;  Laterality: N/A;   IR EMBO ART  VEN HEMORR LYMPH EXTRAV  INC GUIDE ROADMAPPING  01/13/2018   IR FLUORO GUIDE CV LINE RIGHT  01/13/2018   IR IVC FILTER PLMT / S&I /IMG GUID/MOD SED  01/27/2018   IR RENAL SELECTIVE  UNI INC S&I MOD SED  01/13/2018   IR US  GUIDE VASC ACCESS RIGHT  01/13/2018   KIDNEY CYST REMOVAL  2019   LAPAROSCOPIC LYSIS OF ADHESIONS N/A 02/12/2016   Procedure: LAPAROSCOPIC LYSIS OF ADHESIONS;  Surgeon: Krystal Russell, MD;  Location: [facility] OR;  Service: General;  Laterality: N/A;   LEFT AND RIGHT HEART CATHETERIZATION WITH CORONARY ANGIOGRAM N/A 08/04/2012   Procedure: LEFT  AND RIGHT HEART CATHETERIZATION WITH CORONARY ANGIOGRAM;  Surgeon: Salena GORMAN Negri, MD;  Location: [facility] CATH LAB;  Service: Cardiovascular;  Laterality: N/A;   NEPHRECTOMY  2000   right   REVERSE SHOULDER ARTHROPLASTY Right 09/20/2022   Procedure: REVERSE SHOULDER ARTHROPLASTY;  Surgeon: Sharl Selinda Dover, MD;  Location: [facility] OR;  Service: Orthopedics;  Laterality: Right;  120  REVERSE SHOULDER ARTHROPLASTY Left 03/18/2023   Procedure: REVERSE SHOULDER ARTHROPLASTY;  Surgeon: Sharl Selinda Dover, MD;  Location: [facility] OR;  Service: Orthopedics;  Laterality: Left;  120   REVISON OF ARTERIOVENOUS FISTULA Left 06/05/2013   Procedure: REVISON OF LEFT RADIOCEPHALIC  ARTERIOVENOUS FISTULA;  Surgeon: Redell LITTIE Door, MD;  Location: [facility] OR;  Service: Vascular;  Laterality: Left;   REVISON OF ARTERIOVENOUS FISTULA Left 01/27/2016   Procedure: REVISION OF LEFT UPPER EXTREMITY ARTERIOVENOUS FISTULA;  Surgeon: Penne Lonni Colorado, MD;  Location: [facility] OR;  Service: Vascular;  Laterality: Left;   REVISON OF ARTERIOVENOUS FISTULA Left 08/03/2016   Procedure: REVISON OF Left arm ARTERIOVENOUS FISTULA;  Surgeon: Carlin FORBES Haddock, MD;  Location: [facility] OR;  Service: Vascular;  Laterality: Left;   REVISON OF ARTERIOVENOUS FISTULA Left 05/06/2017   Procedure: REVISION PLICATION OF ARTERIOVENOUS FISTULA LEFT ARM;  Surgeon: Oris Krystal FALCON, MD;  Location: [facility] OR;  Service: Vascular;  Laterality: Left;   REVISON OF ARTERIOVENOUS FISTULA Left 02/01/2019   Procedure: REVISION OF ARTERIOVENOUS FISTULA LEFT ARM;  Surgeon: Gretta Lonni PARAS, MD;  Location: [facility] OR;  Service: Vascular;  Laterality: Left;   REVISON OF ARTERIOVENOUS FISTULA Left 03/15/2019   Procedure: REVISION PLICATION OF ARTERIOVENOUS FISTULA LEFT ARM;  Surgeon: Gretta Lonni PARAS, MD;  Location: [facility] OR;  Service: Vascular;  Laterality: Left;   RIGHT HEART CATHETERIZATION N/A 05/21/2011   Procedure: RIGHT HEART CATH;  Surgeon: Salena GORMAN Negri, MD;  Location: [facility] CATH LAB;   Service: Cardiovascular;  Laterality: N/A;   smashed  1990's   left pinky; have a plate in there   UMBILICAL HERNIA REPAIR N/A 07/04/2012   Procedure: LAPAROSCOPIC UMBILICAL HERNIA;  Surgeon: Redell Faith, DO;  Location: [facility] OR;  Service: General;  Laterality: N/A;   US  ECHOCARDIOGRAPHY  05/20/11    Allergies:  Allergies  Allergen Reactions   Ace Inhibitors Itching, Cough and Other (See Comments)    Medications:   Prior to Admission medications   Medication Sig Start Date End Date Taking? Authorizing Provider  albuterol  (VENTOLIN  HFA) 108 (90 Base) MCG/ACT inhaler Inhale 2 puffs into the lungs every 6 (six) hours as needed for wheezing or shortness of breath. 09/18/19   Georgina Speaks, FNP  calcium  acetate (PHOSLO ) 667 MG capsule Take 1-3 capsules (667-2,001 mg total) by mouth See admin instructions. Take 2001 mg capsules with meals three times daily and 667 mg capsule with snacks. 11/23/18   Negri Salena, MD  chlorhexidine  (PERIDEX ) 0.12 % solution 15 mLs by Mouth Rinse route 2 (two) times daily. 05/29/18   [provider]  Control Gel Formula Dressing (DUODERM CGF EXTRA THIN) MISC Apply 1 each topically daily as needed. 10/26/20   Georgina Speaks, FNP  digoxin  (LANOXIN ) 0.125 MG tablet Take 1 tablet (0.125 mg total) by mouth every Monday, Wednesday, and Friday. 06/09/20   Rai, Nydia POUR, MD  gabapentin  (NEURONTIN ) 300 MG capsule Take 1 capsule (300 mg total) by mouth at bedtime. 12/22/22   Georgina Speaks, FNP  glucose blood test strip Use to check blood sugar 3 times a day. Dx code e11.65 10/13/21   Moore, Janece, FNP  HYDROcodone -acetaminophen  (NORCO) 10-325 MG tablet Take 0.5 tablets by mouth 2 (two) times daily as needed for moderate pain. 06/17/22   [provider]  HYDROcodone -acetaminophen  (NORCO) 7.5-325 MG tablet Take 1 tablet by mouth every 6 (six) hours as needed for moderate pain. 09/21/22   McClung, Kevan D, PA  isosorbide  dinitrate (ISORDIL ) 10 MG tablet Take 10  mg by  mouth 2 (two) times daily. 07/07/21   [provider]  Lactobacillus (ACIDOPHILUS/BIFIDUS) WAFR Take 1 Wafer by mouth daily. 07/01/22   Georgina Speaks, FNP  Lancets [facility] DELICA PLUS [place]) MISC Use to check blood sugar 3 times a day. Dx code e11.65 10/13/21   Moore, Janece, FNP  lidocaine  (LIDODERM ) 5 % Place 1 patch onto the skin daily. Remove & Discard patch within 12 hours or as directed by MD 09/11/22   Victor Lynwood DASEN, PA-C  loperamide  (IMODIUM  A-D) 2 MG tablet Take 1 tablet (2 mg total) by mouth 4 (four) times daily as needed for diarrhea or loose stools. Also available OTC. 06/09/20   Rai, Nydia POUR, MD  meclizine  (ANTIVERT ) 25 MG tablet Take 1 tablet (25 mg total) by mouth 3 (three) times daily as needed for dizziness. 04/08/21   Horton, Kristie M, DO  metoprolol  succinate (TOPROL -XL) 25 MG 24 hr tablet Take 1 tablet (25 mg total) by mouth daily. 06/09/20   Rai, Ripudeep POUR, MD  midodrine  (PROAMATINE ) 10 MG tablet Take 1 tablet (10 mg total) by mouth 3 (three) times daily. 06/09/20   Rai, Ripudeep K, MD  multivitamin (RENA-VIT) TABS tablet Take 1 tablet by mouth at bedtime.    [provider]  nitroGLYCERIN  (NITROSTAT ) 0.4 MG SL tablet Place 0.4 mg under the tongue every 5 (five) minutes as needed for chest pain.  05/20/15   [provider]  Nutritional Supplements (FEEDING SUPPLEMENT, NEPRO CARB STEADY,) LIQD Take 237 mLs by mouth daily. 03/22/16   [provider]  ondansetron  (ZOFRAN  ODT) 4 MG disintegrating tablet Take 1 tablet (4 mg total) by mouth every 8 (eight) hours as needed for nausea or vomiting. 06/09/20   Rai, Nydia POUR, MD  oxyCODONE  (ROXICODONE ) 5 MG immediate release tablet Take 1 tablet (5 mg total) by mouth every 6 (six) hours as needed for moderate pain (pain score 4-6) or severe pain (pain score 7-10). 03/19/23 03/18/24  Sharl Selinda Dover, MD  pantoprazole  (PROTONIX ) 40 MG tablet Take 40 mg by mouth 2 (two) times daily.    [provider]  sucroferric oxyhydroxide (VELPHORO ) 500 MG chewable tablet Chew 500-1,000 mg by mouth See admin instructions. Takes 2 tablets with each meal and 1 tablet with snacks. 10/02/18   [provider]  UNABLE TO FIND Out patient physical therapy- vestibular therapy for BPPV 08/01/18   Rizwan, Saima, MD  warfarin (COUMADIN ) 7.5 MG tablet Take 7.5 mg by mouth daily.    [provider]    Discontinued Meds:  There are no discontinued medications.  Social History:  reports that he has never smoked. He has never used smokeless tobacco. He reports current drug use. Drug: Hydrocodone . He reports that he does not drink alcohol.  Family History:   Family History  Problem Relation Age of Onset   Hypertension Mother    Kidney disease Mother     Blood pressure (!) 88/62, pulse 92, resp. rate 16, weight 104.5 kg, SpO2 98%. Gen: NAD CVS: RRR Resp: CTA B/L Abd: soft Ext: no sig edema b/l Les Neuro: awake, alert Dialysis access: Lt [name] hyperpulsatile towards the outflow       MELIA LYNWOOD ORN, MD 06/30/2023, 8:15 AM

## 2023-07-01 ENCOUNTER — Encounter (HOSPITAL_COMMUNITY): Payer: Self-pay | Admitting: Nephrology

## 2023-07-01 DIAGNOSIS — Z992 Dependence on renal dialysis: Secondary | ICD-10-CM | POA: Diagnosis not present

## 2023-07-01 DIAGNOSIS — N186 End stage renal disease: Secondary | ICD-10-CM | POA: Diagnosis not present

## 2023-07-01 DIAGNOSIS — N2581 Secondary hyperparathyroidism of renal origin: Secondary | ICD-10-CM | POA: Diagnosis not present

## 2023-07-04 DIAGNOSIS — N2581 Secondary hyperparathyroidism of renal origin: Secondary | ICD-10-CM | POA: Diagnosis not present

## 2023-07-04 DIAGNOSIS — Z992 Dependence on renal dialysis: Secondary | ICD-10-CM | POA: Diagnosis not present

## 2023-07-04 DIAGNOSIS — N186 End stage renal disease: Secondary | ICD-10-CM | POA: Diagnosis not present

## 2023-07-05 ENCOUNTER — Encounter: Payer: Self-pay | Admitting: Nurse Practitioner

## 2023-07-05 ENCOUNTER — Ambulatory Visit (INDEPENDENT_AMBULATORY_CARE_PROVIDER_SITE_OTHER): Payer: Medicare PPO | Admitting: Nurse Practitioner

## 2023-07-05 VITALS — BP 130/78 | HR 95 | Temp 98.9°F | Ht 68.0 in | Wt 240.2 lb

## 2023-07-05 DIAGNOSIS — M170 Bilateral primary osteoarthritis of knee: Secondary | ICD-10-CM | POA: Diagnosis not present

## 2023-07-05 DIAGNOSIS — Z993 Dependence on wheelchair: Secondary | ICD-10-CM

## 2023-07-05 DIAGNOSIS — Z992 Dependence on renal dialysis: Secondary | ICD-10-CM

## 2023-07-05 DIAGNOSIS — E559 Vitamin D deficiency, unspecified: Secondary | ICD-10-CM

## 2023-07-05 DIAGNOSIS — R6889 Other general symptoms and signs: Secondary | ICD-10-CM | POA: Diagnosis not present

## 2023-07-05 DIAGNOSIS — D508 Other iron deficiency anemias: Secondary | ICD-10-CM

## 2023-07-05 DIAGNOSIS — E782 Mixed hyperlipidemia: Secondary | ICD-10-CM | POA: Diagnosis not present

## 2023-07-05 DIAGNOSIS — I700 Atherosclerosis of aorta: Secondary | ICD-10-CM | POA: Diagnosis not present

## 2023-07-05 DIAGNOSIS — N186 End stage renal disease: Secondary | ICD-10-CM | POA: Diagnosis not present

## 2023-07-05 DIAGNOSIS — I132 Hypertensive heart and chronic kidney disease with heart failure and with stage 5 chronic kidney disease, or end stage renal disease: Secondary | ICD-10-CM | POA: Diagnosis not present

## 2023-07-05 DIAGNOSIS — Z2821 Immunization not carried out because of patient refusal: Secondary | ICD-10-CM

## 2023-07-05 MED ORDER — GABAPENTIN 300 MG PO CAPS: 300.0000 mg | ORAL_CAPSULE | Freq: Every day | ORAL | 1 refills | Status: DC

## 2023-07-05 NOTE — Progress Notes (Signed)
 [name], CMA,acting as a Neurosurgeon for [name], FNP.,have documented all relevant documentation on the behalf of [name], FNP,as directed by  [name], FNP while in the presence of [name], FNP.  Subjective:  Patient ID: [name] , male    DOB: 11/29/1960 , 63 y.o.   MRN: [id number]  Chief Complaint  Patient presents with   Hypertension    HPI  Patient presents today for a bp and dm follow up, Patient reports compliance with medication. Patient denies any chest pain, SOB, or headaches. Patient reports he has been having stomach pain today and he is unable to get warm. Patient did have dialysis yesterday. Patient reports his left foot is darker than the right.  He went to dialysis on yesterday. He had his fistula cleaned on 2/6/025     Past Medical History:  Diagnosis Date   Acute lower GI bleeding 06/21/2018   Acute respiratory failure with hypoxia (HCC) 01/13/2015   Arthritis    Atrial flutter (HCC)    Bile leak, postoperative 03/15/2016   Biliary dyskinesia 02/12/2016   Blind right eye    Hx: of partial blindness in right eye   Cardiomyopathy    CHF (congestive heart failure) (HCC)    Coronary artery disease    normal coronaries by 10/10/08 cath, Cardiac Cath 08-04-12 epic.Dr. Algie Coffer follows   Diabetes mellitus    pt. states he's borderline diabetic., no longer taking med,- off med. since 2013   Dialysis patient [facility])    Mon-Wed-Fri([facility])- Left AV fistula   Diverticulitis 03/2015   reoccurred in December 2016   Diverticulitis of intestine without perforation or abscess without bleeding 04/21/2015   Dizziness 11/22/2018   DVT (deep venous thrombosis) (HCC)    ESRD (end stage renal disease) (HCC)    GERD (gastroesophageal reflux disease)    Gout    Hemorrhage of left kidney 01/13/2018   History of nephrectomy 07/04/2012   History of right nephrectomy in 2002 for renal cell carcinoma    History of unilateral nephrectomy     Hypertension    Hypotension due to hypovolemia 06/04/2020   Low iron    Myocardial infarction [facility]) ?2006   Renal cell carcinoma    dialysis- [facility]- [address]- Dr. Briant Cedar follows.   Renal insufficiency    Shortness of breath 05/19/2011   "at rest, lying down, w/exertion"   Stroke [facility]) 02/2011   05/19/11 denies residual   Umbilical hernia 05/19/2011   unrepaired   Wears glasses      Family History  Problem Relation Age of Onset   Hypertension Mother    Kidney disease Mother      Current Outpatient Medications:    albuterol (VENTOLIN HFA) 108 (90 Base) MCG/ACT inhaler, Inhale 2 puffs into the lungs every 6 (six) hours as needed for wheezing or shortness of breath., Disp: 18 g, Rfl: 2   calcium acetate (PHOSLO) 667 MG capsule, Take 1-3 capsules (667-2,001 mg total) by mouth See admin instructions. Take 2001 mg capsules with meals three times daily and 667 mg capsule with snacks., Disp:  , Rfl:    chlorhexidine (PERIDEX) 0.12 % solution, 15 mLs by Mouth Rinse route 2 (two) times daily., Disp: , Rfl:    Control Gel Formula Dressing (DUODERM CGF EXTRA THIN) MISC, Apply 1 each topically daily as needed., Disp: 10 each, Rfl: 2   digoxin (LANOXIN) 0.125 MG tablet, Take 1 tablet (0.125 mg total) by mouth every Monday, Wednesday, and  Friday., Disp: 30 tablet, Rfl: 1   glucose blood test strip, Use to check blood sugar 3 times a day. Dx code e11.65, Disp: 100 each, Rfl: 12   HYDROcodone-acetaminophen (NORCO) 10-325 MG tablet, Take 0.5 tablets by mouth 2 (two) times daily as needed for moderate pain., Disp: , Rfl:    HYDROcodone-acetaminophen (NORCO) 7.5-325 MG tablet, Take 1 tablet by mouth every 6 (six) hours as needed for moderate pain., Disp: 28 tablet, Rfl: 0   isosorbide dinitrate (ISORDIL) 10 MG tablet, Take 10 mg by mouth 2 (two) times daily., Disp: , Rfl:    Lactobacillus (ACIDOPHILUS/BIFIDUS) WAFR, Take 1 Wafer by mouth daily., Disp: 90 Wafer, Rfl: 1   Lancets (ONETOUCH DELICA  PLUS LANCET33G) MISC, Use to check blood sugar 3 times a day. Dx code e11.65, Disp: 100 each, Rfl: 3   lidocaine (LIDODERM) 5 %, Place 1 patch onto the skin daily. Remove & Discard patch within 12 hours or as directed by MD, Disp: 30 patch, Rfl: 0   loperamide (IMODIUM A-D) 2 MG tablet, Take 1 tablet (2 mg total) by mouth 4 (four) times daily as needed for diarrhea or loose stools. Also available OTC., Disp: 30 tablet, Rfl: 0   meclizine (ANTIVERT) 25 MG tablet, Take 1 tablet (25 mg total) by mouth 3 (three) times daily as needed for dizziness., Disp: 30 tablet, Rfl: 0   metoprolol succinate (TOPROL-XL) 25 MG 24 hr tablet, Take 1 tablet (25 mg total) by mouth daily., Disp: 30 tablet, Rfl: 3   midodrine (PROAMATINE) 10 MG tablet, Take 1 tablet (10 mg total) by mouth 3 (three) times daily., Disp: 90 tablet, Rfl: 3   multivitamin (RENA-VIT) TABS tablet, Take 1 tablet by mouth at bedtime., Disp: , Rfl:    nitroGLYCERIN (NITROSTAT) 0.4 MG SL tablet, Place 0.4 mg under the tongue every 5 (five) minutes as needed for chest pain. , Disp: , Rfl: 0   Nutritional Supplements (FEEDING SUPPLEMENT, NEPRO CARB STEADY,) LIQD, Take 237 mLs by mouth daily., Disp: , Rfl:    ondansetron (ZOFRAN ODT) 4 MG disintegrating tablet, Take 1 tablet (4 mg total) by mouth every 8 (eight) hours as needed for nausea or vomiting., Disp: 20 tablet, Rfl: 0   oxyCODONE (ROXICODONE) 5 MG immediate release tablet, Take 1 tablet (5 mg total) by mouth every 6 (six) hours as needed for moderate pain (pain score 4-6) or severe pain (pain score 7-10)., Disp: 30 tablet, Rfl: 0   pantoprazole (PROTONIX) 40 MG tablet, Take 40 mg by mouth 2 (two) times daily., Disp: , Rfl:    sucroferric oxyhydroxide (VELPHORO) 500 MG chewable tablet, Chew 500-1,000 mg by mouth See admin instructions. Takes 2 tablets with each meal and 1 tablet with snacks., Disp: , Rfl:    UNABLE TO FIND, Out patient physical therapy- vestibular therapy for BPPV, Disp: 1 each, Rfl:  0   warfarin (COUMADIN) 7.5 MG tablet, Take 7.5 mg by mouth daily., Disp: , Rfl:    amoxicillin-clavulanate (AUGMENTIN) 875-125 MG tablet, Take 1 tablet by mouth every 12 (twelve) hours., Disp: 14 tablet, Rfl: 0   gabapentin (NEURONTIN) 300 MG capsule, Take 1 capsule (300 mg total) by mouth at bedtime., Disp: 90 capsule, Rfl: 1   ondansetron (ZOFRAN) 4 MG tablet, Take 1 tablet (4 mg total) by mouth every 6 (six) hours., Disp: 12 tablet, Rfl: 0   Allergies  Allergen Reactions   Ace Inhibitors Itching, Cough and Other (See Comments)     Review of Systems  Constitutional:  Positive for appetite change.  HENT: Negative.    Respiratory: Negative.    Cardiovascular: Negative.   Gastrointestinal:  Positive for abdominal pain and diarrhea (small amount of diarrhea - thinks is related to his dialysis).  Musculoskeletal:  Positive for arthralgias (bilateral shoulder pain and decreased range of motion) and gait problem (unable to walk long distances).       Bilateral shoulder pain  Allergic/Immunologic: Negative.   Hematological: Negative.   All other systems reviewed and are negative.    Today's Vitals   07/05/23 1057  BP: 130/78  Pulse: 95  Temp: 98.9 F (37.2 C)  TempSrc: Oral  Weight: 240 lb 3.2 oz (109 kg)  Height: 5\' 8"  (1.727 m)  PainSc: 8   PainLoc: Abdomen   Body mass index is 36.52 kg/m.  Wt Readings from Last 3 Encounters:  07/05/23 240 lb 3.2 oz (109 kg)  06/30/23 230 lb 6.1 oz (104.5 kg)  03/19/23 222 lb 10.6 oz (101 kg)    The ASCVD Risk score (Arnett DK, et al., 2019) failed to calculate for the following reasons:   Risk score cannot be calculated because patient has a medical history suggesting prior/existing ASCVD  Objective:  Physical Exam Vitals reviewed.  Constitutional:      General: He is not in acute distress.    Appearance: Normal appearance. He is obese.  Cardiovascular:     Rate and Rhythm: Normal rate and regular rhythm.     Pulses: Normal pulses.      Heart sounds: Normal heart sounds. No murmur heard. Pulmonary:     Effort: Pulmonary effort is normal. No respiratory distress.     Breath sounds: Normal breath sounds. No wheezing.  Musculoskeletal:     Comments: Wheelchair dependent  Skin:    General: Skin is warm and dry.     Capillary Refill: Capillary refill takes less than 2 seconds.  Neurological:     General: No focal deficit present.     Mental Status: He is alert and oriented to person, place, and time.     Cranial Nerves: No cranial nerve deficit.     Motor: No weakness.  Psychiatric:        Mood and Affect: Mood normal.        Behavior: Behavior normal.        Thought Content: Thought content normal.        Judgment: Judgment normal.         Assessment And Plan:  Hypertensive heart and CKD, ESRD on dialysis, w CHF (HCC) Assessment & Plan: Blood pressure is well controlled, continue current medications   Mixed hyperlipidemia Assessment & Plan: Cholesterol levels have been stable, continue statin. Tolerating well.   Orders: -     Hemoglobin A1c  Iron deficiency anemia secondary to inadequate dietary iron intake Assessment & Plan: Will check iron levels   Orders: -     CBC with Differential/Platelet  Cold feeling -     Iron, TIBC and Ferritin Panel -     TSH  Vitamin D deficiency Assessment & Plan: Will check vitamin D level and supplement as needed.    Also encouraged to spend 15 minutes in the sun daily.     Atherosclerosis of aorta (HCC) Assessment & Plan: Continue statin  Orders: -     Lipid panel  COVID-19 vaccination declined Assessment & Plan: Declines covid 19 vaccine. Discussed risk of covid 19 and if he changes her mind about the vaccine to  call the office. Education has been provided regarding the importance of this vaccine but patient still declined. Advised may receive this vaccine at local pharmacy or Health Dept.or vaccine clinic. Aware to provide a copy of the vaccination  record if obtained from local pharmacy or Health Dept.  Encouraged to take multivitamin, vitamin d, vitamin c and zinc to increase immune system. Aware can call office if would like to have vaccine here at office. Verbalized acceptance and understanding.    Herpes zoster vaccination declined Assessment & Plan: Declines shingrix, educated on disease process and is aware if he changes his mind to notify office    End-stage renal disease on hemodialysis [facility]) Assessment & Plan: Continue three day a week dialysis, tolerating satisfactory no changes.    Wheelchair dependent  Other orders -     Gabapentin; Take 1 capsule (300 mg total) by mouth at bedtime.  Dispense: 90 capsule; Refill: 1    Return for controlled DM check 4 months.  Patient was given opportunity to ask questions. Patient verbalized understanding of the plan and was able to repeat key elements of the plan. All questions were answered to their satisfaction.    Jeanell Sparrow, FNP, have reviewed all documentation for this visit. The documentation on 07/05/23 for the exam, diagnosis, procedures, and orders are all accurate and complete.   IF YOU HAVE BEEN REFERRED TO A SPECIALIST, IT MAY TAKE 1-2 WEEKS TO SCHEDULE/PROCESS THE REFERRAL. IF YOU HAVE NOT HEARD FROM US/SPECIALIST IN TWO WEEKS, PLEASE GIVE [place] A CALL AT [phone].

## 2023-07-06 DIAGNOSIS — Z992 Dependence on renal dialysis: Secondary | ICD-10-CM | POA: Diagnosis not present

## 2023-07-06 DIAGNOSIS — N2581 Secondary hyperparathyroidism of renal origin: Secondary | ICD-10-CM | POA: Diagnosis not present

## 2023-07-06 DIAGNOSIS — N186 End stage renal disease: Secondary | ICD-10-CM | POA: Diagnosis not present

## 2023-07-06 LAB — CBC WITH DIFFERENTIAL/PLATELET
Basophils Absolute: 0 10*3/uL (ref 0.0–0.2)
Basos: 1 %
EOS (ABSOLUTE): 0.2 10*3/uL (ref 0.0–0.4)
Eos: 4 %
Hematocrit: 35 % — ABNORMAL LOW (ref 37.5–51.0)
Hemoglobin: 11.4 g/dL — ABNORMAL LOW (ref 13.0–17.7)
Immature Grans (Abs): 0 10*3/uL (ref 0.0–0.1)
Immature Granulocytes: 0 %
Lymphocytes Absolute: 1.5 10*3/uL (ref 0.7–3.1)
Lymphs: 22 %
MCH: 29.8 pg (ref 26.6–33.0)
MCHC: 32.6 g/dL (ref 31.5–35.7)
MCV: 92 fL (ref 79–97)
Monocytes Absolute: 0.6 10*3/uL (ref 0.1–0.9)
Monocytes: 8 %
Neutrophils Absolute: 4.4 10*3/uL (ref 1.4–7.0)
Neutrophils: 65 %
Platelets: 294 10*3/uL (ref 150–450)
RBC: 3.82 x10E6/uL — ABNORMAL LOW (ref 4.14–5.80)
RDW: 16.6 % — ABNORMAL HIGH (ref 11.6–15.4)
WBC: 6.6 10*3/uL (ref 3.4–10.8)

## 2023-07-06 LAB — LIPID PANEL
Chol/HDL Ratio: 3.4 {ratio} (ref 0.0–5.0)
Cholesterol, Total: 185 mg/dL (ref 100–199)
HDL: 55 mg/dL (ref >39)
LDL Chol Calc (NIH): 100 mg/dL — ABNORMAL HIGH (ref 0–99)
Triglycerides: 177 mg/dL — ABNORMAL HIGH (ref 0–149)
VLDL Cholesterol Cal: 30 mg/dL (ref 5–40)

## 2023-07-06 LAB — IRON,TIBC AND FERRITIN PANEL
Ferritin: 1164 ng/mL — ABNORMAL HIGH (ref 30–400)
Iron Saturation: 18 % (ref 15–55)
Iron: 43 ug/dL (ref 38–169)
Total Iron Binding Capacity: 235 ug/dL — ABNORMAL LOW (ref 250–450)
UIBC: 192 ug/dL (ref 111–343)

## 2023-07-06 LAB — TSH: TSH: 3.2 u[IU]/mL (ref 0.450–4.500)

## 2023-07-06 LAB — HEMOGLOBIN A1C
Est. average glucose Bld gHb Est-mCnc: 97 mg/dL
Hgb A1c MFr Bld: 5 % (ref 4.8–5.6)

## 2023-07-07 ENCOUNTER — Ambulatory Visit (INDEPENDENT_AMBULATORY_CARE_PROVIDER_SITE_OTHER): Payer: Medicare PPO | Admitting: Podiatry

## 2023-07-07 ENCOUNTER — Encounter: Payer: Self-pay | Admitting: Podiatry

## 2023-07-07 DIAGNOSIS — M79674 Pain in right toe(s): Secondary | ICD-10-CM

## 2023-07-07 DIAGNOSIS — E119 Type 2 diabetes mellitus without complications: Secondary | ICD-10-CM | POA: Diagnosis not present

## 2023-07-07 DIAGNOSIS — B351 Tinea unguium: Secondary | ICD-10-CM | POA: Diagnosis not present

## 2023-07-07 DIAGNOSIS — M79675 Pain in left toe(s): Secondary | ICD-10-CM

## 2023-07-07 NOTE — Progress Notes (Signed)
This patient returns to my office for at risk foot care.  This patient requires this care by a professional since this patient will be at risk due to having DM, chronic anticoagulation and ESRD.  This patient is unable to cut nails himself since the patient cannot reach his nails.These nails are painful walking and wearing shoes.  This patient presents for at risk foot care today.  General Appearance  Alert, conversant and in no acute stress.  Vascular  Dorsalis pedis and posterior tibial  pulses are palpable  bilaterally.  Capillary return is within normal limits  bilaterally. Temperature is within normal limits  bilaterally.  Neurologic  Senn-Weinstein monofilament wire test within normal limits  bilaterally. Muscle power within normal limits bilaterally.  Nails Thick disfigured discolored nails with subungual debris  from hallux to fifth toes bilaterally. No evidence of bacterial infection or drainage bilaterally.  Orthopedic  No limitations of motion  feet .  No crepitus or effusions noted.  No bony pathology or digital deformities noted.  Skin  normotropic skin with no porokeratosis noted bilaterally.  No signs of infections or ulcers noted.     Onychomycosis  Pain in right toes  Pain in left toes  Consent was obtained for treatment procedures.   Mechanical debridement of nails 1-5  bilaterally performed with a nail nipper.  Filed with dremel without incident.    Return office visit     3   months                 Told patient to return for periodic foot care and evaluation due to potential at risk complications.   Helane Gunther DPM

## 2023-07-08 DIAGNOSIS — N186 End stage renal disease: Secondary | ICD-10-CM | POA: Diagnosis not present

## 2023-07-08 DIAGNOSIS — N2581 Secondary hyperparathyroidism of renal origin: Secondary | ICD-10-CM | POA: Diagnosis not present

## 2023-07-08 DIAGNOSIS — Z992 Dependence on renal dialysis: Secondary | ICD-10-CM | POA: Diagnosis not present

## 2023-07-11 DIAGNOSIS — Z992 Dependence on renal dialysis: Secondary | ICD-10-CM | POA: Diagnosis not present

## 2023-07-11 DIAGNOSIS — N186 End stage renal disease: Secondary | ICD-10-CM | POA: Diagnosis not present

## 2023-07-11 DIAGNOSIS — N2581 Secondary hyperparathyroidism of renal origin: Secondary | ICD-10-CM | POA: Diagnosis not present

## 2023-07-12 ENCOUNTER — Emergency Department (HOSPITAL_COMMUNITY): Payer: Medicare PPO

## 2023-07-12 ENCOUNTER — Emergency Department (HOSPITAL_COMMUNITY)
Admission: EM | Admit: 2023-07-12 | Discharge: 2023-07-12 | Disposition: A | Discharge status: Home / Self Care | Payer: Medicare PPO | Attending: Emergency Medicine | Admitting: Emergency Medicine

## 2023-07-12 ENCOUNTER — Other Ambulatory Visit: Payer: Self-pay

## 2023-07-12 DIAGNOSIS — K432 Incisional hernia without obstruction or gangrene: Secondary | ICD-10-CM | POA: Diagnosis not present

## 2023-07-12 DIAGNOSIS — Z79899 Other long term (current) drug therapy: Secondary | ICD-10-CM | POA: Diagnosis not present

## 2023-07-12 DIAGNOSIS — M170 Bilateral primary osteoarthritis of knee: Secondary | ICD-10-CM | POA: Diagnosis not present

## 2023-07-12 DIAGNOSIS — K5732 Diverticulitis of large intestine without perforation or abscess without bleeding: Secondary | ICD-10-CM | POA: Diagnosis not present

## 2023-07-12 DIAGNOSIS — R1084 Generalized abdominal pain: Secondary | ICD-10-CM | POA: Diagnosis not present

## 2023-07-12 DIAGNOSIS — R1011 Right upper quadrant pain: Secondary | ICD-10-CM | POA: Diagnosis present

## 2023-07-12 DIAGNOSIS — K429 Umbilical hernia without obstruction or gangrene: Secondary | ICD-10-CM | POA: Diagnosis not present

## 2023-07-12 DIAGNOSIS — Z905 Acquired absence of kidney: Secondary | ICD-10-CM | POA: Diagnosis not present

## 2023-07-12 DIAGNOSIS — K5792 Diverticulitis of intestine, part unspecified, without perforation or abscess without bleeding: Secondary | ICD-10-CM

## 2023-07-12 DIAGNOSIS — K460 Unspecified abdominal hernia with obstruction, without gangrene: Secondary | ICD-10-CM | POA: Diagnosis not present

## 2023-07-12 LAB — CBC WITH DIFFERENTIAL/PLATELET
Abs Immature Granulocytes: 0.02 10*3/uL (ref 0.00–0.07)
Basophils Absolute: 0.1 10*3/uL (ref 0.0–0.1)
Basophils Relative: 1 %
Eosinophils Absolute: 0.3 10*3/uL (ref 0.0–0.5)
Eosinophils Relative: 4 %
HCT: 37.5 % — ABNORMAL LOW (ref 39.0–52.0)
Hemoglobin: 11.6 g/dL — ABNORMAL LOW (ref 13.0–17.0)
Immature Granulocytes: 0 %
Lymphocytes Relative: 25 %
Lymphs Abs: 1.9 10*3/uL (ref 0.7–4.0)
MCH: 28.9 pg (ref 26.0–34.0)
MCHC: 30.9 g/dL (ref 30.0–36.0)
MCV: 93.5 fL (ref 80.0–100.0)
Monocytes Absolute: 0.8 10*3/uL (ref 0.1–1.0)
Monocytes Relative: 10 %
Neutro Abs: 4.5 10*3/uL (ref 1.7–7.7)
Neutrophils Relative %: 60 %
Platelets: 248 10*3/uL (ref 150–400)
RBC: 4.01 MIL/uL — ABNORMAL LOW (ref 4.22–5.81)
RDW: 18.1 % — ABNORMAL HIGH (ref 11.5–15.5)
WBC: 7.5 10*3/uL (ref 4.0–10.5)
nRBC: 0 % (ref 0.0–0.2)

## 2023-07-12 LAB — COMPREHENSIVE METABOLIC PANEL
ALT: 10 U/L (ref 0–44)
AST: 13 U/L — ABNORMAL LOW (ref 15–41)
Albumin: 3.5 g/dL (ref 3.5–5.0)
Alkaline Phosphatase: 83 U/L (ref 38–126)
Anion gap: 17 — ABNORMAL HIGH (ref 5–15)
BUN: 24 mg/dL — ABNORMAL HIGH (ref 6–20)
CO2: 25 mmol/L (ref 22–32)
Calcium: 10.5 mg/dL — ABNORMAL HIGH (ref 8.9–10.3)
Chloride: 93 mmol/L — ABNORMAL LOW (ref 98–111)
Creatinine, Ser: 8.13 mg/dL — ABNORMAL HIGH (ref 0.61–1.24)
GFR, Estimated: 7 mL/min — ABNORMAL LOW (ref >60)
Glucose, Bld: 87 mg/dL (ref 70–99)
Potassium: 4.3 mmol/L (ref 3.5–5.1)
Sodium: 135 mmol/L (ref 135–145)
Total Bilirubin: 1 mg/dL (ref 0.0–1.2)
Total Protein: 8.2 g/dL — ABNORMAL HIGH (ref 6.5–8.1)

## 2023-07-12 LAB — LIPASE, BLOOD: Lipase: 28 U/L (ref 11–51)

## 2023-07-12 MED ORDER — ONDANSETRON HCL 4 MG PO TABS: 4.0000 mg | ORAL_TABLET | Freq: Four times a day (QID) | ORAL | 0 refills | Status: DC

## 2023-07-12 MED ORDER — AMOXICILLIN-POT CLAVULANATE 875-125 MG PO TABS: 1.0000 | ORAL_TABLET | Freq: Two times a day (BID) | ORAL | 0 refills | Status: DC

## 2023-07-12 NOTE — Discharge Instructions (Addendum)
Return for any problem.   Take all of Augmentin as prescribed to treat possible diverticulitis.

## 2023-07-12 NOTE — ED Triage Notes (Signed)
Pt. Stated, I just left my Dr. And said to come here I have a incarcerated hernia is what the paper said. Ive had abdominal pain for 2 days.  Sent for extra xrays and evaluation

## 2023-07-12 NOTE — ED Provider Triage Note (Signed)
Emergency Medicine Provider Triage Evaluation Note  [name] , a 63 y.o. male  was evaluated in triage.  Pt complains of right upper quadrant bulge.  Patient reports bulge at site of prior surgical incision site.  He reports that he noticed this swelling over the last 2 to 3 days.    Review of Systems  Positive: Bulge at right upper quadrant, concern for hernia Negative: Fever, chest pain, shortness of breath  Physical Exam  BP 91/61 (BP Location: Right Arm)   Pulse 94   Temp 97.8 F (36.6 C)   Resp 17   SpO2 98%  Gen:   Awake, no distress   Resp:  Normal effort  MSK:   Moves extremities without difficulty  Other:  Likely palpable hernia overlying right upper quadrant incisional site from prior surgery, it appears to be easily reducible in triage.  Medical Decision Making  Medically screening exam initiated at 11:26 AM.  Appropriate orders placed.  [name] was informed that the remainder of the evaluation will be completed by another provider, this initial triage assessment does not replace that evaluation, and the importance of remaining in the ED until their evaluation is complete.  Patient understands triage process and need to remain in the ED until completion of ED evaluation.   Matthew Fines, MD 07/12/23 [phone]

## 2023-07-13 ENCOUNTER — Telehealth: Payer: Self-pay

## 2023-07-13 DIAGNOSIS — Z992 Dependence on renal dialysis: Secondary | ICD-10-CM | POA: Diagnosis not present

## 2023-07-13 DIAGNOSIS — N186 End stage renal disease: Secondary | ICD-10-CM | POA: Diagnosis not present

## 2023-07-13 DIAGNOSIS — N2581 Secondary hyperparathyroidism of renal origin: Secondary | ICD-10-CM | POA: Diagnosis not present

## 2023-07-13 NOTE — Transitions of Care (Post Inpatient/ED Visit) (Signed)
   07/13/2023  Name: [name] MRN: [id number] DOB: 07-May-1961  Today's TOC FU Call Status: Today's TOC FU Call Status:: Unsuccessful Call (1st Attempt) Unsuccessful Call (1st Attempt) Date: 07/13/23  Attempted to reach the patient regarding the most recent Inpatient/ED visit.  Follow Up Plan: Additional outreach attempts will be made to reach the patient to complete the Transitions of Care (Post Inpatient/ED visit) call.   Signature YL,RMA

## 2023-07-13 NOTE — ED Provider Notes (Signed)
[place] EMERGENCY DEPARTMENT AT [facility] Provider Note   CSN: [id number] Arrival date & time: 07/12/23  1002     History  Chief Complaint  Patient presents with   Hernia   Abdominal Pain    [name] is a 63 y.o. male.  [name] , a 63 y.o. male  was evaluated in triage.  Pt complains of right upper quadrant bulge.   Patient reports bulge at site of prior surgical incision site.  He reports that he noticed this swelling over the last 2 to 3 days.   The history is provided by the patient and medical records.       Home Medications Prior to Admission medications   Medication Sig Start Date End Date Taking? Authorizing Provider  amoxicillin-clavulanate (AUGMENTIN) 875-125 MG tablet Take 1 tablet by mouth every 12 (twelve) hours. 07/12/23  Yes Wynetta Fines, MD  ondansetron (ZOFRAN) 4 MG tablet Take 1 tablet (4 mg total) by mouth every 6 (six) hours. 07/12/23  Yes Wynetta Fines, MD  albuterol (VENTOLIN HFA) 108 (90 Base) MCG/ACT inhaler Inhale 2 puffs into the lungs every 6 (six) hours as needed for wheezing or shortness of breath. 09/18/19   [name], FNP  calcium acetate (PHOSLO) 667 MG capsule Take 1-3 capsules (667-2,001 mg total) by mouth See admin instructions. Take 2001 mg capsules with meals three times daily and 667 mg capsule with snacks. 11/23/18   Orpah Cobb, MD  chlorhexidine (PERIDEX) 0.12 % solution 15 mLs by Mouth Rinse route 2 (two) times daily. 05/29/18   [provider]  Control Gel Formula Dressing (DUODERM CGF EXTRA THIN) MISC Apply 1 each topically daily as needed. 10/26/20   [name], FNP  digoxin (LANOXIN) 0.125 MG tablet Take 1 tablet (0.125 mg total) by mouth every Monday, Wednesday, and Friday. 06/09/20   Rai, Delene Ruffini, MD  gabapentin (NEURONTIN) 300 MG capsule Take 1 capsule (300 mg total) by mouth at bedtime. 07/05/23   [name], FNP  glucose blood test strip Use to check blood sugar 3 times a day. Dx  code e11.65 10/13/21   [name], FNP  HYDROcodone-acetaminophen (NORCO) 10-325 MG tablet Take 0.5 tablets by mouth 2 (two) times daily as needed for moderate pain. 06/17/22   [provider]  HYDROcodone-acetaminophen (NORCO) 7.5-325 MG tablet Take 1 tablet by mouth every 6 (six) hours as needed for moderate pain. 09/21/22   McClung, Fuller Plan D, PA  isosorbide dinitrate (ISORDIL) 10 MG tablet Take 10 mg by mouth 2 (two) times daily. 07/07/21   [provider]  Lactobacillus (ACIDOPHILUS/BIFIDUS) WAFR Take 1 Wafer by mouth daily. 07/01/22   [name], FNP  Lancets [facility] DELICA PLUS [place]) MISC Use to check blood sugar 3 times a day. Dx code e11.65 10/13/21   [name], FNP  lidocaine (LIDODERM) 5 % Place 1 patch onto the skin daily. Remove & Discard patch within 12 hours or as directed by MD 09/11/22   Netta Corrigan, PA-C  loperamide (IMODIUM A-D) 2 MG tablet Take 1 tablet (2 mg total) by mouth 4 (four) times daily as needed for diarrhea or loose stools. Also available OTC. 06/09/20   Rai, Delene Ruffini, MD  meclizine (ANTIVERT) 25 MG tablet Take 1 tablet (25 mg total) by mouth 3 (three) times daily as needed for dizziness. 04/08/21   Horton, Clabe Seal, DO  metoprolol succinate (TOPROL-XL) 25 MG 24 hr tablet Take 1 tablet (25 mg total) by mouth  daily. 06/09/20   Rai, Delene Ruffini, MD  midodrine (PROAMATINE) 10 MG tablet Take 1 tablet (10 mg total) by mouth 3 (three) times daily. 06/09/20   Rai, Delene Ruffini, MD  multivitamin (RENA-VIT) TABS tablet Take 1 tablet by mouth at bedtime.    [provider]  nitroGLYCERIN (NITROSTAT) 0.4 MG SL tablet Place 0.4 mg under the tongue every 5 (five) minutes as needed for chest pain.  05/20/15   [provider]  Nutritional Supplements (FEEDING SUPPLEMENT, NEPRO CARB STEADY,) LIQD Take 237 mLs by mouth daily. 03/22/16   [provider]  ondansetron (ZOFRAN ODT) 4 MG disintegrating tablet Take 1 tablet (4 mg total) by  mouth every 8 (eight) hours as needed for nausea or vomiting. 06/09/20   Rai, Ripudeep Kirtland Bouchard, MD  oxyCODONE (ROXICODONE) 5 MG immediate release tablet Take 1 tablet (5 mg total) by mouth every 6 (six) hours as needed for moderate pain (pain score 4-6) or severe pain (pain score 7-10). 03/19/23 03/18/24  Yolonda Kida, MD  pantoprazole (PROTONIX) 40 MG tablet Take 40 mg by mouth 2 (two) times daily.    [provider]  sucroferric oxyhydroxide (VELPHORO) 500 MG chewable tablet Chew 500-1,000 mg by mouth See admin instructions. Takes 2 tablets with each meal and 1 tablet with snacks. 10/02/18   [provider]  UNABLE TO FIND Out patient physical therapy- vestibular therapy for BPPV 08/01/18   Calvert Cantor, MD  warfarin (COUMADIN) 7.5 MG tablet Take 7.5 mg by mouth daily.    [provider]      Allergies    Ace inhibitors    Review of Systems   Review of Systems  All other systems reviewed and are negative.   Physical Exam Updated Vital Signs BP 126/77 (BP Location: Right Arm)   Pulse 81   Temp 98.5 F (36.9 C) (Oral)   Resp 11   SpO2 98%  Physical Exam Vitals and nursing note reviewed.  Constitutional:      General: He is not in acute distress.    Appearance: Normal appearance. He is well-developed.  HENT:     Head: Normocephalic and atraumatic.  Eyes:     Conjunctiva/sclera: Conjunctivae normal.     Pupils: Pupils are equal, round, and reactive to light.  Cardiovascular:     Rate and Rhythm: Normal rate and regular rhythm.     Heart sounds: Normal heart sounds.  Pulmonary:     Effort: Pulmonary effort is normal. No respiratory distress.     Breath sounds: Normal breath sounds.  Abdominal:     General: There is no distension.     Palpations: Abdomen is soft.     Tenderness: There is no abdominal tenderness.     Comments: Soft, easily reducible bulge at the central aspect of the healed incision site of the patient's RUQ, nontender, no rebound or  guarding  Musculoskeletal:        General: No deformity. Normal range of motion.     Cervical back: Normal range of motion and neck supple.  Skin:    General: Skin is warm and dry.  Neurological:     General: No focal deficit present.     Mental Status: He is alert and oriented to person, place, and time.     ED Results / Procedures / Treatments   Labs (all labs ordered are listed, but only abnormal results are displayed) Labs Reviewed  COMPREHENSIVE METABOLIC PANEL - Abnormal; Notable for the following components:  Result Value   Chloride 93 (*)    BUN 24 (*)    Creatinine, Ser 8.13 (*)    Calcium 10.5 (*)    Total Protein 8.2 (*)    AST 13 (*)    GFR, Estimated 7 (*)    Anion gap 17 (*)    All other components within normal limits  CBC WITH DIFFERENTIAL/PLATELET - Abnormal; Notable for the following components:   RBC 4.01 (*)    Hemoglobin 11.6 (*)    HCT 37.5 (*)    RDW 18.1 (*)    All other components within normal limits  LIPASE, BLOOD    EKG None  Radiology CT ABDOMEN PELVIS WO CONTRAST Result Date: 07/12/2023 CLINICAL DATA:  Abdominal pain.  Concern for hernia. EXAM: CT ABDOMEN AND PELVIS WITHOUT CONTRAST TECHNIQUE: Multidetector CT imaging of the abdomen and pelvis was performed following the standard protocol without IV contrast. RADIATION DOSE REDUCTION: This exam was performed according to the departmental dose-optimization program which includes automated exposure control, adjustment of the mA and/or kV according to patient size and/or use of iterative reconstruction technique. COMPARISON:  CT abdomen pelvis dated 05/24/2022. FINDINGS: Evaluation of this exam is limited in the absence of intravenous contrast. Lower chest: There is eventration of the left hemidiaphragm. The visualized right lung base is clear. No intra-abdominal free air or free fluid. Hepatobiliary: Small cyst in the left lobe of the liver. No biliary dilatation. Cholecystectomy. Pancreas:  Unremarkable. No pancreatic ductal dilatation or surrounding inflammatory changes. Spleen: Normal in size without focal abnormality. Adrenals/Urinary Tract: The adrenal glands unremarkable. Right nephrectomy and left renal atrophy. The urinary bladder is collapsed. Stomach/Bowel: There is diffuse colonic diverticulosis with inflammatory changes of the descending colon consistent with acute diverticulitis. No diverticular abscess or perforation. There is no bowel obstruction. The appendix is normal. Vascular/Lymphatic: Moderate aortoiliac atherosclerotic disease. An infrarenal IVC filter noted. No portal venous gas. There is no adenopathy. Reproductive: The prostate and seminal vesicles are grossly unremarkable. Other: Small fat containing umbilical hernia. Prior ventral hernia repair. Musculoskeletal: Osteopenia with degenerative changes of the spine. No acute osseous pathology. IMPRESSION: 1. Acute diverticulitis of the descending colon. No diverticular abscess or perforation. 2. Right nephrectomy and left renal atrophy. 3.  Aortic Atherosclerosis (ICD10-I70.0). Electronically Signed   By: Elgie Collard M.D.   On: 07/12/2023 16:16    Procedures Procedures    Medications Ordered in ED Medications - No data to display  ED Course/ Medical Decision Making/ A&P                                 Medical Decision Making Amount and/or Complexity of Data Reviewed Labs: ordered. Radiology: ordered.  Risk Prescription drug management.    Medical Screen Complete  This patient presented to the ED with complaint of concern for hernia.  This complaint involves an extensive number of treatment options. The initial differential diagnosis includes, but is not limited to, incisional hernia, metabolic abnormality, other intraabdominal process, etc  This presentation is: Chronic, Self-Limited, Previously Undiagnosed, and Uncertain Prognosis  Patient with concern for incisional hernia (RUQ).  No  evidence of incarcerated hernia on exam.   Screening labs unremarkable for acute process.   CT AP suggests diverticulitis of descending colon. The patient appears to be without symptoms related this finding. However, will treat with Augmentin to cover possibility of early illness.  Patient understands need for close FU with Gen Surgery  for evaluation of possible hernia.  Strict return precautions given and understood.   Co morbidities that complicated the patient's evaluation  See HPI   Additional history obtained:  External records from outside sources obtained and reviewed including prior ED visits and prior Inpatient records.    Lab Tests:  I ordered and personally interpreted labs.  The pertinent results include:  cbc cmp lipase   Imaging Studies ordered:  I ordered imaging studies including ct AP   I agree with the radiologist interpretation.      Problem List / ED Course:  Concern for incisional hernia   Reevaluation:  After the interventions noted above, I reevaluated the patient and found that they have: improved    Disposition:  After consideration of the diagnostic results and the patients response to treatment, I feel that the patent would benefit from close outpatient followup.          Final Clinical Impression(s) / ED Diagnoses Final diagnoses:  Incisional hernia, without obstruction or gangrene  Diverticulitis    Rx / DC Orders ED Discharge Orders          Ordered    ondansetron (ZOFRAN) 4 MG tablet  Every 6 hours        07/12/23 1646    amoxicillin-clavulanate (AUGMENTIN) 875-125 MG tablet  Every 12 hours        07/12/23 1646              Wynetta Fines, MD 07/13/23 1157

## 2023-07-14 ENCOUNTER — Telehealth: Payer: Self-pay

## 2023-07-14 NOTE — Transitions of Care (Post Inpatient/ED Visit) (Signed)
   07/14/2023  Name: GIORDANO GETMAN MRN: [id number] DOB: [birth year]  Today's TOC FU Call Status: Today's TOC FU Call Status:: Unsuccessful Call (2nd Attempt) Unsuccessful Call (2nd Attempt) Date: 07/14/23  Attempted to reach the patient regarding the most recent Inpatient/ED visit.  Follow Up Plan: Additional outreach attempts will be made to reach the patient to complete the Transitions of Care (Post Inpatient/ED visit) call.   Signature  Lisabeth Devoid, [place]

## 2023-07-15 DIAGNOSIS — N2581 Secondary hyperparathyroidism of renal origin: Secondary | ICD-10-CM | POA: Diagnosis not present

## 2023-07-15 DIAGNOSIS — N186 End stage renal disease: Secondary | ICD-10-CM | POA: Diagnosis not present

## 2023-07-15 DIAGNOSIS — Z992 Dependence on renal dialysis: Secondary | ICD-10-CM | POA: Diagnosis not present

## 2023-07-17 DIAGNOSIS — Z2821 Immunization not carried out because of patient refusal: Secondary | ICD-10-CM | POA: Insufficient documentation

## 2023-07-17 DIAGNOSIS — E559 Vitamin D deficiency, unspecified: Secondary | ICD-10-CM | POA: Insufficient documentation

## 2023-07-17 DIAGNOSIS — R6889 Other general symptoms and signs: Secondary | ICD-10-CM | POA: Insufficient documentation

## 2023-07-17 DIAGNOSIS — Z993 Dependence on wheelchair: Secondary | ICD-10-CM | POA: Insufficient documentation

## 2023-07-17 NOTE — Assessment & Plan Note (Signed)
 Continue statin.

## 2023-07-17 NOTE — Assessment & Plan Note (Signed)
 Will check vitamin D level and supplement as needed.    Also encouraged to spend 15 minutes in the sun daily.

## 2023-07-17 NOTE — Assessment & Plan Note (Signed)
 Will check iron levels.

## 2023-07-17 NOTE — Assessment & Plan Note (Signed)
 Declines covid 19 vaccine. Discussed risk of covid 32 and if he changes her mind about the vaccine to call the office. Education has been provided regarding the importance of this vaccine but patient still declined. Advised may receive this vaccine at local pharmacy or Health Dept.or vaccine clinic. Aware to provide a copy of the vaccination record if obtained from local pharmacy or Health Dept.  Encouraged to take multivitamin, vitamin d, vitamin c and zinc to increase immune system. Aware can call office if would like to have vaccine here at office. Verbalized acceptance and understanding.

## 2023-07-17 NOTE — Assessment & Plan Note (Signed)
 Blood pressure is well controlled, continue current medications.

## 2023-07-17 NOTE — Assessment & Plan Note (Signed)
 Declines shingrix, educated on disease process and is aware if he changes his mind to notify office

## 2023-07-17 NOTE — Assessment & Plan Note (Signed)
 Continue three day a week dialysis, tolerating satisfactory no changes.

## 2023-07-17 NOTE — Assessment & Plan Note (Signed)
Cholesterol levels have been stable, continue statin. Tolerating well.

## 2023-07-18 DIAGNOSIS — N2581 Secondary hyperparathyroidism of renal origin: Secondary | ICD-10-CM | POA: Diagnosis not present

## 2023-07-18 DIAGNOSIS — Z992 Dependence on renal dialysis: Secondary | ICD-10-CM | POA: Diagnosis not present

## 2023-07-18 DIAGNOSIS — N186 End stage renal disease: Secondary | ICD-10-CM | POA: Diagnosis not present

## 2023-07-19 ENCOUNTER — Inpatient Hospital Stay: Payer: Medicare PPO | Admitting: Nurse Practitioner

## 2023-07-19 DIAGNOSIS — E119 Type 2 diabetes mellitus without complications: Secondary | ICD-10-CM | POA: Diagnosis not present

## 2023-07-19 LAB — HM DIABETES EYE EXAM

## 2023-07-19 NOTE — Progress Notes (Deleted)
 [name], CMA,acting as a Neurosurgeon for [name], FNP.,have documented all relevant documentation on the behalf of [name], FNP,as directed by  [name], FNP while in the presence of [name], FNP.  Subjective:  Patient ID: [name] , male    DOB: [birth year] , 63 y.o.   MRN: [id number]  No chief complaint on file.   HPI  Patient presents today for a hospital follow up, patient reports compliance with medications. Patient denies any chest pain, SOB, or headaches. Patient was admitted and discharged on 07/12/2023. Patient was diagnosed with Incisional hernia, without obstruction or gangrene Patient reports today they are feeling     Past Medical History:  Diagnosis Date  . Acute lower GI bleeding 06/21/2018  . Acute respiratory failure with hypoxia (HCC) 01/13/2015  . Arthritis   . Atrial flutter (HCC)   . Bile leak, postoperative 03/15/2016  . Biliary dyskinesia 02/12/2016  . 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 Cardiomyopathy   . CHF (congestive heart failure) (HCC)   . Coronary artery disease    normal coronaries by 10/10/08 cath, Cardiac Cath 08-04-12 epic.Dr. Algie Coffer follows  . Diabetes mellitus    pt. states he's borderline diabetic., no longer taking med,- off med. since 2013  . Dialysis patient [facility])    Mon-Wed-Fri([facility])- Left AV fistula  . Diverticulitis 03/2015   reoccurred in December 2016  . Diverticulitis of intestine without perforation or abscess without bleeding 04/21/2015  . Dizziness 11/22/2018  . DVT (deep venous thrombosis) (HCC)   . ESRD (end stage renal disease) (HCC)   . GERD (gastroesophageal reflux disease)   . Gout   . Hemorrhage of left kidney 01/13/2018  . History of nephrectomy 07/04/2012   History of right nephrectomy in 2002 for renal cell carcinoma   . History of unilateral nephrectomy   . Hypertension   . Hypotension due to hypovolemia 06/04/2020  . Low iron   . Myocardial  infarction [facility]) ?2006  . Renal cell carcinoma    dialysis- M[facility]- [address]- Dr. Briant Cedar follows.  . Renal insufficiency   . Shortness of breath 05/19/2011   "at rest, lying down, w/exertion"  . Stroke [facility]) 02/2011   05/19/11 denies residual  . Umbilical hernia 05/19/2011   unrepaired  . Wears glasses      Family History  Problem Relation Age of Onset  . Hypertension Mother   . Kidney disease Mother      Current Outpatient Medications:  .  albuterol (VENTOLIN HFA) 108 (90 Base) MCG/ACT inhaler, Inhale 2 puffs into the lungs every 6 (six) hours as needed for wheezing or shortness of breath., Disp: 18 g, Rfl: 2 .  amoxicillin-clavulanate (AUGMENTIN) 875-125 MG tablet, Take 1 tablet by mouth every 12 (twelve) hours., Disp: 14 tablet, Rfl: 0 .  calcium acetate (PHOSLO) 667 MG capsule, Take 1-3 capsules (667-2,001 mg total) by mouth See admin instructions. Take 2001 mg capsules with meals three times daily and 667 mg capsule with snacks., Disp:  , Rfl:  .  chlorhexidine (PERIDEX) 0.12 % solution, 15 mLs by Mouth Rinse route 2 (two) times daily., Disp: , Rfl:  .  Control Gel Formula Dressing (DUODERM CGF EXTRA THIN) MISC, Apply 1 each topically daily as needed., Disp: 10 each, Rfl: 2 .  digoxin (LANOXIN) 0.125 MG tablet, Take 1 tablet (0.125 mg total) by mouth every Monday, Wednesday, and Friday., Disp: 30 tablet, Rfl: 1 .  gabapentin (NEURONTIN) 300 MG capsule, Take 1 capsule (300 mg total) by mouth at bedtime., Disp: 90 capsule, Rfl: 1 .  glucose blood test strip, Use to check blood sugar 3 times a day. Dx code e11.65, Disp: 100 each, Rfl: 12 .  HYDROcodone-acetaminophen (NORCO) 10-325 MG tablet, Take 0.5 tablets by mouth 2 (two) times daily as needed for moderate pain., Disp: , Rfl:  .  HYDROcodone-acetaminophen (NORCO) 7.5-325 MG tablet, Take 1 tablet by mouth every 6 (six) hours as needed for moderate pain., Disp: 28 tablet, Rfl: 0 .  isosorbide dinitrate (ISORDIL) 10 MG tablet,  Take 10 mg by mouth 2 (two) times daily., Disp: , Rfl:  .  Lactobacillus (ACIDOPHILUS/BIFIDUS) WAFR, Take 1 Wafer by mouth daily., Disp: 90 Wafer, Rfl: 1 .  Lancets (ONETOUCH DELICA PLUS LANCET33G) MISC, Use to check blood sugar 3 times a day. Dx code e11.65, Disp: 100 each, Rfl: 3 .  lidocaine (LIDODERM) 5 %, Place 1 patch onto the skin daily. Remove & Discard patch within 12 hours or as directed by MD, Disp: 30 patch, Rfl: 0 .  loperamide (IMODIUM A-D) 2 MG tablet, Take 1 tablet (2 mg total) by mouth 4 (four) times daily as needed for diarrhea or loose stools. Also available OTC., Disp: 30 tablet, Rfl: 0 .  meclizine (ANTIVERT) 25 MG tablet, Take 1 tablet (25 mg total) by mouth 3 (three) times daily as needed for dizziness., Disp: 30 tablet, Rfl: 0 .  metoprolol succinate (TOPROL-XL) 25 MG 24 hr tablet, Take 1 tablet (25 mg total) by mouth daily., Disp: 30 tablet, Rfl: 3 .  midodrine (PROAMATINE) 10 MG tablet, Take 1 tablet (10 mg total) by mouth 3 (three) times daily., Disp: 90 tablet, Rfl: 3 .  multivitamin (RENA-VIT) TABS tablet, Take 1 tablet by mouth at bedtime., Disp: , Rfl:  .  nitroGLYCERIN (NITROSTAT) 0.4 MG SL tablet, Place 0.4 mg under the tongue every 5 (five) minutes as needed for chest pain. , Disp: , Rfl: 0 .  Nutritional Supplements (FEEDING SUPPLEMENT, NEPRO CARB STEADY,) LIQD, Take 237 mLs by mouth daily., Disp: , Rfl:  .  ondansetron (ZOFRAN ODT) 4 MG disintegrating tablet, Take 1 tablet (4 mg total) by mouth every 8 (eight) hours as needed for nausea or vomiting., Disp: 20 tablet, Rfl: 0 .  ondansetron (ZOFRAN) 4 MG tablet, Take 1 tablet (4 mg total) by mouth every 6 (six) hours., Disp: 12 tablet, Rfl: 0 .  oxyCODONE (ROXICODONE) 5 MG immediate release tablet, Take 1 tablet (5 mg total) by mouth every 6 (six) hours as needed for moderate pain (pain score 4-6) or severe pain (pain score 7-10)., Disp: 30 tablet, Rfl: 0 .  pantoprazole (PROTONIX) 40 MG tablet, Take 40 mg by mouth 2  (two) times daily., Disp: , Rfl:  .  sucroferric oxyhydroxide (VELPHORO) 500 MG chewable tablet, Chew 500-1,000 mg by mouth See admin instructions. Takes 2 tablets with each meal and 1 tablet with snacks., Disp: , Rfl:  .  UNABLE TO FIND, Out patient physical therapy- vestibular therapy for BPPV, Disp: 1 each, Rfl: 0 .  warfarin (COUMADIN) 7.5 MG tablet, Take 7.5 mg by mouth daily., Disp: , Rfl:    Allergies  Allergen Reactions  . Ace Inhibitors Itching, Cough and Other (See Comments)     Review of Systems   There were no vitals filed for this visit. There is no height or weight on file to calculate BMI.  Wt Readings from Last 3 Encounters:  07/05/23  240 lb 3.2 oz (109 kg)  06/30/23 230 lb 6.1 oz (104.5 kg)  03/19/23 222 lb 10.6 oz (101 kg)    The ASCVD Risk score (Arnett DK, et al., 2019) failed to calculate for the following reasons:   Risk score cannot be calculated because patient has a medical history suggesting prior/existing ASCVD  Objective:  Physical Exam      Assessment And Plan:  Hospital discharge follow-up  Incisional hernia without obstruction or gangrene    No follow-ups on file.  Patient was given opportunity to ask questions. Patient verbalized understanding of the plan and was able to repeat key elements of the plan. All questions were answered to their satisfaction.    Jeanell Sparrow, FNP, have reviewed all documentation for this visit. The documentation on 07/19/23 for the exam, diagnosis, procedures, and orders are all accurate and complete.   IF YOU HAVE BEEN REFERRED TO A SPECIALIST, IT MAY TAKE 1-2 WEEKS TO SCHEDULE/PROCESS THE REFERRAL. IF YOU HAVE NOT HEARD FROM US/SPECIALIST IN TWO WEEKS, PLEASE GIVE [place] A CALL AT [phone].

## 2023-07-20 DIAGNOSIS — Z992 Dependence on renal dialysis: Secondary | ICD-10-CM | POA: Diagnosis not present

## 2023-07-20 DIAGNOSIS — N186 End stage renal disease: Secondary | ICD-10-CM | POA: Diagnosis not present

## 2023-07-20 DIAGNOSIS — N2581 Secondary hyperparathyroidism of renal origin: Secondary | ICD-10-CM | POA: Diagnosis not present

## 2023-07-21 DIAGNOSIS — M6250 Muscle wasting and atrophy, not elsewhere classified, unspecified site: Secondary | ICD-10-CM | POA: Diagnosis not present

## 2023-07-21 DIAGNOSIS — N186 End stage renal disease: Secondary | ICD-10-CM | POA: Diagnosis not present

## 2023-07-22 DIAGNOSIS — I120 Hypertensive chronic kidney disease with stage 5 chronic kidney disease or end stage renal disease: Secondary | ICD-10-CM | POA: Diagnosis not present

## 2023-07-22 DIAGNOSIS — Z992 Dependence on renal dialysis: Secondary | ICD-10-CM | POA: Diagnosis not present

## 2023-07-22 DIAGNOSIS — N2581 Secondary hyperparathyroidism of renal origin: Secondary | ICD-10-CM | POA: Diagnosis not present

## 2023-07-22 DIAGNOSIS — N186 End stage renal disease: Secondary | ICD-10-CM | POA: Diagnosis not present

## 2023-07-25 DIAGNOSIS — N2581 Secondary hyperparathyroidism of renal origin: Secondary | ICD-10-CM | POA: Diagnosis not present

## 2023-07-25 DIAGNOSIS — Z992 Dependence on renal dialysis: Secondary | ICD-10-CM | POA: Diagnosis not present

## 2023-07-25 DIAGNOSIS — N186 End stage renal disease: Secondary | ICD-10-CM | POA: Diagnosis not present

## 2023-07-27 DIAGNOSIS — N186 End stage renal disease: Secondary | ICD-10-CM | POA: Diagnosis not present

## 2023-07-27 DIAGNOSIS — Z992 Dependence on renal dialysis: Secondary | ICD-10-CM | POA: Diagnosis not present

## 2023-07-27 DIAGNOSIS — N2581 Secondary hyperparathyroidism of renal origin: Secondary | ICD-10-CM | POA: Diagnosis not present

## 2023-07-29 DIAGNOSIS — N186 End stage renal disease: Secondary | ICD-10-CM | POA: Diagnosis not present

## 2023-07-29 DIAGNOSIS — N2581 Secondary hyperparathyroidism of renal origin: Secondary | ICD-10-CM | POA: Diagnosis not present

## 2023-07-29 DIAGNOSIS — Z992 Dependence on renal dialysis: Secondary | ICD-10-CM | POA: Diagnosis not present

## 2023-08-01 DIAGNOSIS — N2581 Secondary hyperparathyroidism of renal origin: Secondary | ICD-10-CM | POA: Diagnosis not present

## 2023-08-01 DIAGNOSIS — Z992 Dependence on renal dialysis: Secondary | ICD-10-CM | POA: Diagnosis not present

## 2023-08-01 DIAGNOSIS — N186 End stage renal disease: Secondary | ICD-10-CM | POA: Diagnosis not present

## 2023-08-03 DIAGNOSIS — N186 End stage renal disease: Secondary | ICD-10-CM | POA: Diagnosis not present

## 2023-08-03 DIAGNOSIS — N2581 Secondary hyperparathyroidism of renal origin: Secondary | ICD-10-CM | POA: Diagnosis not present

## 2023-08-03 DIAGNOSIS — Z992 Dependence on renal dialysis: Secondary | ICD-10-CM | POA: Diagnosis not present

## 2023-08-05 DIAGNOSIS — Z992 Dependence on renal dialysis: Secondary | ICD-10-CM | POA: Diagnosis not present

## 2023-08-05 DIAGNOSIS — N2581 Secondary hyperparathyroidism of renal origin: Secondary | ICD-10-CM | POA: Diagnosis not present

## 2023-08-05 DIAGNOSIS — N186 End stage renal disease: Secondary | ICD-10-CM | POA: Diagnosis not present

## 2023-08-08 DIAGNOSIS — N186 End stage renal disease: Secondary | ICD-10-CM | POA: Diagnosis not present

## 2023-08-08 DIAGNOSIS — N2581 Secondary hyperparathyroidism of renal origin: Secondary | ICD-10-CM | POA: Diagnosis not present

## 2023-08-08 DIAGNOSIS — Z992 Dependence on renal dialysis: Secondary | ICD-10-CM | POA: Diagnosis not present

## 2023-08-10 DIAGNOSIS — N186 End stage renal disease: Secondary | ICD-10-CM | POA: Diagnosis not present

## 2023-08-10 DIAGNOSIS — N2581 Secondary hyperparathyroidism of renal origin: Secondary | ICD-10-CM | POA: Diagnosis not present

## 2023-08-10 DIAGNOSIS — Z992 Dependence on renal dialysis: Secondary | ICD-10-CM | POA: Diagnosis not present

## 2023-08-12 DIAGNOSIS — Z992 Dependence on renal dialysis: Secondary | ICD-10-CM | POA: Diagnosis not present

## 2023-08-12 DIAGNOSIS — N186 End stage renal disease: Secondary | ICD-10-CM | POA: Diagnosis not present

## 2023-08-12 DIAGNOSIS — N2581 Secondary hyperparathyroidism of renal origin: Secondary | ICD-10-CM | POA: Diagnosis not present

## 2023-08-15 DIAGNOSIS — Z992 Dependence on renal dialysis: Secondary | ICD-10-CM | POA: Diagnosis not present

## 2023-08-15 DIAGNOSIS — N186 End stage renal disease: Secondary | ICD-10-CM | POA: Diagnosis not present

## 2023-08-15 DIAGNOSIS — N2581 Secondary hyperparathyroidism of renal origin: Secondary | ICD-10-CM | POA: Diagnosis not present

## 2023-08-17 DIAGNOSIS — N186 End stage renal disease: Secondary | ICD-10-CM | POA: Diagnosis not present

## 2023-08-17 DIAGNOSIS — N2581 Secondary hyperparathyroidism of renal origin: Secondary | ICD-10-CM | POA: Diagnosis not present

## 2023-08-17 DIAGNOSIS — Z992 Dependence on renal dialysis: Secondary | ICD-10-CM | POA: Diagnosis not present

## 2023-08-19 DIAGNOSIS — N186 End stage renal disease: Secondary | ICD-10-CM | POA: Diagnosis not present

## 2023-08-19 DIAGNOSIS — Z992 Dependence on renal dialysis: Secondary | ICD-10-CM | POA: Diagnosis not present

## 2023-08-19 DIAGNOSIS — N2581 Secondary hyperparathyroidism of renal origin: Secondary | ICD-10-CM | POA: Diagnosis not present

## 2023-08-22 DIAGNOSIS — N186 End stage renal disease: Secondary | ICD-10-CM | POA: Diagnosis not present

## 2023-08-22 DIAGNOSIS — N2581 Secondary hyperparathyroidism of renal origin: Secondary | ICD-10-CM | POA: Diagnosis not present

## 2023-08-22 DIAGNOSIS — Z992 Dependence on renal dialysis: Secondary | ICD-10-CM | POA: Diagnosis not present

## 2023-08-22 DIAGNOSIS — I120 Hypertensive chronic kidney disease with stage 5 chronic kidney disease or end stage renal disease: Secondary | ICD-10-CM | POA: Diagnosis not present

## 2023-08-24 DIAGNOSIS — Z992 Dependence on renal dialysis: Secondary | ICD-10-CM | POA: Diagnosis not present

## 2023-08-24 DIAGNOSIS — N186 End stage renal disease: Secondary | ICD-10-CM | POA: Diagnosis not present

## 2023-08-24 DIAGNOSIS — N2581 Secondary hyperparathyroidism of renal origin: Secondary | ICD-10-CM | POA: Diagnosis not present

## 2023-08-26 DIAGNOSIS — N186 End stage renal disease: Secondary | ICD-10-CM | POA: Diagnosis not present

## 2023-08-26 DIAGNOSIS — N2581 Secondary hyperparathyroidism of renal origin: Secondary | ICD-10-CM | POA: Diagnosis not present

## 2023-08-26 DIAGNOSIS — Z992 Dependence on renal dialysis: Secondary | ICD-10-CM | POA: Diagnosis not present

## 2023-08-29 DIAGNOSIS — N2581 Secondary hyperparathyroidism of renal origin: Secondary | ICD-10-CM | POA: Diagnosis not present

## 2023-08-29 DIAGNOSIS — N186 End stage renal disease: Secondary | ICD-10-CM | POA: Diagnosis not present

## 2023-08-29 DIAGNOSIS — Z992 Dependence on renal dialysis: Secondary | ICD-10-CM | POA: Diagnosis not present

## 2023-08-30 DIAGNOSIS — R072 Precordial pain: Secondary | ICD-10-CM | POA: Diagnosis not present

## 2023-08-30 DIAGNOSIS — I5022 Chronic systolic (congestive) heart failure: Secondary | ICD-10-CM | POA: Diagnosis not present

## 2023-08-30 DIAGNOSIS — I2510 Atherosclerotic heart disease of native coronary artery without angina pectoris: Secondary | ICD-10-CM | POA: Diagnosis not present

## 2023-08-30 DIAGNOSIS — N186 End stage renal disease: Secondary | ICD-10-CM | POA: Diagnosis not present

## 2023-08-31 DIAGNOSIS — N2581 Secondary hyperparathyroidism of renal origin: Secondary | ICD-10-CM | POA: Diagnosis not present

## 2023-08-31 DIAGNOSIS — Z992 Dependence on renal dialysis: Secondary | ICD-10-CM | POA: Diagnosis not present

## 2023-08-31 DIAGNOSIS — N186 End stage renal disease: Secondary | ICD-10-CM | POA: Diagnosis not present

## 2023-09-02 DIAGNOSIS — Z992 Dependence on renal dialysis: Secondary | ICD-10-CM | POA: Diagnosis not present

## 2023-09-02 DIAGNOSIS — N2581 Secondary hyperparathyroidism of renal origin: Secondary | ICD-10-CM | POA: Diagnosis not present

## 2023-09-02 DIAGNOSIS — N186 End stage renal disease: Secondary | ICD-10-CM | POA: Diagnosis not present

## 2023-09-05 DIAGNOSIS — N2581 Secondary hyperparathyroidism of renal origin: Secondary | ICD-10-CM | POA: Diagnosis not present

## 2023-09-05 DIAGNOSIS — N186 End stage renal disease: Secondary | ICD-10-CM | POA: Diagnosis not present

## 2023-09-05 DIAGNOSIS — Z992 Dependence on renal dialysis: Secondary | ICD-10-CM | POA: Diagnosis not present

## 2023-09-06 ENCOUNTER — Ambulatory Visit: Payer: Medicare PPO | Admitting: Nurse Practitioner

## 2023-09-06 ENCOUNTER — Encounter: Payer: Self-pay | Admitting: Nurse Practitioner

## 2023-09-06 VITALS — BP 120/70 | HR 86 | Temp 97.7°F | Ht 68.0 in | Wt 235.0 lb

## 2023-09-06 DIAGNOSIS — E1122 Type 2 diabetes mellitus with diabetic chronic kidney disease: Secondary | ICD-10-CM | POA: Diagnosis not present

## 2023-09-06 DIAGNOSIS — M545 Low back pain, unspecified: Secondary | ICD-10-CM

## 2023-09-06 DIAGNOSIS — Z992 Dependence on renal dialysis: Secondary | ICD-10-CM | POA: Diagnosis not present

## 2023-09-06 DIAGNOSIS — Z8719 Personal history of other diseases of the digestive system: Secondary | ICD-10-CM | POA: Diagnosis not present

## 2023-09-06 DIAGNOSIS — N186 End stage renal disease: Secondary | ICD-10-CM

## 2023-09-06 DIAGNOSIS — I483 Typical atrial flutter: Secondary | ICD-10-CM | POA: Diagnosis not present

## 2023-09-06 DIAGNOSIS — I132 Hypertensive heart and chronic kidney disease with heart failure and with stage 5 chronic kidney disease, or end stage renal disease: Secondary | ICD-10-CM

## 2023-09-06 DIAGNOSIS — Z6835 Body mass index (BMI) 35.0-35.9, adult: Secondary | ICD-10-CM | POA: Insufficient documentation

## 2023-09-06 DIAGNOSIS — E669 Obesity, unspecified: Secondary | ICD-10-CM | POA: Insufficient documentation

## 2023-09-06 DIAGNOSIS — I5042 Chronic combined systolic (congestive) and diastolic (congestive) heart failure: Secondary | ICD-10-CM

## 2023-09-06 NOTE — Assessment & Plan Note (Signed)
 Controlled, No current medications.

## 2023-09-06 NOTE — Progress Notes (Addendum)
 I,Jameka J Llittleton, CMA,acting as a Neurosurgeon for [employer], FNP.,have documented all relevant documentation on the behalf of Susanna Epley, FNP,as directed by  Susanna Epley, FNP while in the presence of Susanna Epley, FNP.  Subjective:  Patient ID: [name] , male    DOB: 1961-01-28 , 63 y.o.   MRN: [id number]  No chief complaint on file.   HPI  Here after going to ER in February due to having a bulge in his abdomen, he was advised he has a hernia and had acute diverticulitis.  He is already seen [facility] in the recommended to continue to monitor area.  He has not had any further issues with his diverticulitis  He does report having low back pain for the last couple days. He has not had any physical therapy in the past.  Pain does not radiate to his lower extremities.  Wife present in room during visit.     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 Sharyn Deforest follows   Diabetes mellitus    pt. states he's borderline diabetic., no longer taking med,- off med. since 2013   Dialysis patient [facility])    Mon-Wed-Fri([facility])- Left AV fistula   Diverticulitis 03/2015   reoccurred in December 2016   Diverticulitis of intestine without perforation or abscess without bleeding 04/21/2015   Dizziness 11/22/2018   DVT (deep venous thrombosis) (HCC)    ESRD (end stage renal disease) (HCC)    GERD (gastroesophageal reflux disease)    Gout    Hemorrhage of left kidney 01/13/2018   History  of nephrectomy 07/04/2012   History of right nephrectomy in 2002 for renal cell carcinoma    History of unilateral nephrectomy    Hypertension    Hypotension due to hypovolemia 06/04/2020   Low iron     Myocardial infarction [facility]) ?2006   Renal cell carcinoma    dialysis- [facility]- [address]- Dr. Harry Lindau follows.   Renal insufficiency    Shortness of breath 05/19/2011   "at rest, lying down, w/exertion"   Stroke [facility]) 02/2011   05/19/11 denies residual   Umbilical hernia 05/19/2011   unrepaired   Wears glasses      Family History  Problem Relation Age of Onset   Hypertension Mother    Kidney disease Mother      Current Outpatient Medications:    albuterol  (VENTOLIN  HFA) 108 (90 Base) MCG/ACT inhaler, Inhale 2 puffs into the lungs every 6 (six) hours as needed for wheezing or shortness of breath., Disp: 18 g, Rfl: 2   calcium  acetate (PHOSLO ) 667 MG capsule, Take 1-3 capsules (667-2,001 mg total) by mouth See admin instructions. Take 2001 mg capsules with meals three times daily and 667 mg capsule with snacks., Disp:  , Rfl:    chlorhexidine  (PERIDEX ) 0.12 % solution, 15 mLs by Mouth Rinse route 2 (two) times daily., Disp: , Rfl:    Control Gel Formula Dressing (DUODERM CGF EXTRA THIN) MISC, Apply 1 each topically  daily as needed., Disp: 10 each, Rfl: 2   digoxin  (LANOXIN ) 0.125 MG tablet, Take 1 tablet (0.125 mg total) by mouth every Monday, Wednesday, and Friday., Disp: 30 tablet, Rfl: 1   gabapentin  (NEURONTIN ) 300 MG capsule, Take 1 capsule (300 mg total) by mouth at bedtime., Disp: 90 capsule, Rfl: 1   glucose blood test strip, Use to check blood sugar 3 times a day. Dx code e11.65, Disp: 100 each, Rfl: 12   HYDROcodone -acetaminophen  (NORCO) 10-325 MG tablet, Take 0.5 tablets by mouth 2 (two) times daily as needed for moderate pain., Disp: , Rfl:    HYDROcodone -acetaminophen  (NORCO) 7.5-325 MG tablet, Take 1 tablet by mouth every 6 (six) hours as needed for moderate pain.,  Disp: 28 tablet, Rfl: 0   isosorbide  dinitrate (ISORDIL ) 10 MG tablet, Take 10 mg by mouth 2 (two) times daily., Disp: , Rfl:    Lactobacillus (ACIDOPHILUS/BIFIDUS) WAFR, Take 1 Wafer by mouth daily., Disp: 90 Wafer, Rfl: 1   Lancets (ONETOUCH DELICA PLUS LANCET33G) MISC, Use to check blood sugar 3 times a day. Dx code e11.65, Disp: 100 each, Rfl: 3   lidocaine  (LIDODERM ) 5 %, Place 1 patch onto the skin daily. Remove & Discard patch within 12 hours or as directed by MD, Disp: 30 patch, Rfl: 0   loperamide  (IMODIUM  A-D) 2 MG tablet, Take 1 tablet (2 mg total) by mouth 4 (four) times daily as needed for diarrhea or loose stools. Also available OTC., Disp: 30 tablet, Rfl: 0   meclizine  (ANTIVERT ) 25 MG tablet, Take 1 tablet (25 mg total) by mouth 3 (three) times daily as needed for dizziness., Disp: 30 tablet, Rfl: 0   metoprolol  succinate (TOPROL -XL) 25 MG 24 hr tablet, Take 1 tablet (25 mg total) by mouth daily., Disp: 30 tablet, Rfl: 3   midodrine  (PROAMATINE ) 10 MG tablet, Take 1 tablet (10 mg total) by mouth 3 (three) times daily., Disp: 90 tablet, Rfl: 3   multivitamin (RENA-VIT) TABS tablet, Take 1 tablet by mouth at bedtime., Disp: , Rfl:    nitroGLYCERIN  (NITROSTAT ) 0.4 MG SL tablet, Place 0.4 mg under the tongue every 5 (five) minutes as needed for chest pain. , Disp: , Rfl: 0   Nutritional Supplements (FEEDING SUPPLEMENT, NEPRO CARB STEADY,) LIQD, Take 237 mLs by mouth daily., Disp: , Rfl:    ondansetron  (ZOFRAN  ODT) 4 MG disintegrating tablet, Take 1 tablet (4 mg total) by mouth every 8 (eight) hours as needed for nausea or vomiting., Disp: 20 tablet, Rfl: 0   ondansetron  (ZOFRAN ) 4 MG tablet, Take 1 tablet (4 mg total) by mouth every 6 (six) hours., Disp: 12 tablet, Rfl: 0   oxyCODONE  (ROXICODONE ) 5 MG immediate release tablet, Take 1 tablet (5 mg total) by mouth every 6 (six) hours as needed for moderate pain (pain score 4-6) or severe pain (pain score 7-10)., Disp: 30 tablet, Rfl: 0    pantoprazole  (PROTONIX ) 40 MG tablet, Take 40 mg by mouth 2 (two) times daily., Disp: , Rfl:    sucroferric oxyhydroxide (VELPHORO ) 500 MG chewable tablet, Chew 500-1,000 mg by mouth See admin instructions. Takes 2 tablets with each meal and 1 tablet with snacks., Disp: , Rfl:    UNABLE TO FIND, Out patient physical therapy- vestibular therapy for BPPV, Disp: 1 each, Rfl: 0   warfarin (COUMADIN ) 7.5 MG tablet, Take 7.5 mg by mouth daily., Disp: , Rfl:    Allergies  Allergen Reactions   Ace Inhibitors Itching, Cough and Other (See Comments)  Review of Systems  Constitutional: Negative.   Respiratory: Negative.    Cardiovascular: Negative.   Neurological: Negative.   Psychiatric/Behavioral: Negative.       Today's Vitals   09/06/23 1435  BP: 120/70  Pulse: 86  Temp: 97.7 F (36.5 C)  TempSrc: Oral  Weight: 235 lb (106.6 kg)  Height: 5\' 8"  (1.727 m)  PainSc: 10-Worst pain ever  PainLoc: Back   Body mass index is 35.73 kg/m.  Wt Readings from Last 3 Encounters:  09/06/23 235 lb (106.6 kg)  07/05/23 240 lb 3.2 oz (109 kg)  06/30/23 230 lb 6.1 oz (104.5 kg)     Objective:  Physical Exam Vitals reviewed.  Constitutional:      General: He is not in acute distress.    Appearance: Normal appearance. He is obese.  Cardiovascular:     Rate and Rhythm: Normal rate and regular rhythm.     Pulses: Normal pulses.     Heart sounds: Normal heart sounds. No murmur heard. Pulmonary:     Effort: Pulmonary effort is normal. No respiratory distress.     Breath sounds: Normal breath sounds. No wheezing.  Abdominal:     Tenderness: There is right CVA tenderness. There is no left CVA tenderness.  Musculoskeletal:        General: Tenderness (lower back bilaterally) present.     Comments: Wheelchair dependent  Skin:    General: Skin is warm and dry.     Capillary Refill: Capillary refill takes less than 2 seconds.  Neurological:     General: No focal deficit present.     Mental  Status: He is alert and oriented to person, place, and time.     Cranial Nerves: No cranial nerve deficit.     Motor: No weakness.  Psychiatric:        Mood and Affect: Mood normal.        Behavior: Behavior normal.        Thought Content: Thought content normal.        Judgment: Judgment normal.         Assessment And Plan:  Acute bilateral low back pain without sciatica Assessment & Plan: Tenderness to palpation mostly on right lower back, CVA tenderness. I feel this is more muscular tenderness. He is to use a salonpas or pain patch of his choice as needed. Will order PT for stretching and strength/endurance   Orders: -     Ambulatory referral to Home Health  History of diverticulitis Assessment & Plan: He is doing better since his hospitalizatio   Obesity, morbid (HCC) Assessment & Plan: He is encouraged to strive for BMI less than 30 to decrease cardiac risk. Advised to aim for at least 150 minutes of exercise per week.    BMI 35.0-35.9,adult  End-stage renal disease on hemodialysis [facility]) Assessment & Plan: Continue dialysis Monday Wednesday Friday.   Hypertensive heart and CKD, ESRD on dialysis, w CHF (HCC) Assessment & Plan: Blood pressure is well controlled, continue current medications   Typical atrial flutter (HCC) Assessment & Plan: Stable, Continue warfarin and going to coumadin  clinic.    Type 2 diabetes mellitus with end-stage renal disease (HCC) Assessment & Plan: Controlled, No current medications.    Chronic combined systolic (congestive) and diastolic (congestive) heart failure (HCC) Assessment & Plan: Stable, continue f/u with Cardiology     No follow-ups on file.  Patient was given opportunity to ask questions. Patient verbalized understanding of the plan and was able to repeat  key elements of the plan. All questions were answered to their satisfaction.    Inge Mangle, FNP, have reviewed all documentation for this visit. The  documentation on 09/14/23 for the exam, diagnosis, procedures, and orders are all accurate and complete.   IF YOU HAVE BEEN REFERRED TO A SPECIALIST, IT MAY TAKE 1-2 WEEKS TO SCHEDULE/PROCESS THE REFERRAL. IF YOU HAVE NOT HEARD FROM US /SPECIALIST IN TWO WEEKS, PLEASE GIVE US  A CALL AT [phone].

## 2023-09-06 NOTE — Assessment & Plan Note (Signed)
 Tenderness to palpation mostly on right lower back, CVA tenderness. I feel this is more muscular tenderness. He is to use a salonpas or pain patch of his choice as needed. Will order PT for stretching and strength/endurance

## 2023-09-06 NOTE — Assessment & Plan Note (Signed)
 Blood pressure is well controlled, continue current medications.

## 2023-09-06 NOTE — Assessment & Plan Note (Signed)
 Continue dialysis Monday Wednesday Friday

## 2023-09-06 NOTE — Assessment & Plan Note (Signed)
 He is doing better since his hospitalizatio

## 2023-09-06 NOTE — Assessment & Plan Note (Signed)
 He is encouraged to strive for BMI less than 30 to decrease cardiac risk. Advised to aim for at least 150 minutes of exercise per week.

## 2023-09-07 DIAGNOSIS — Z992 Dependence on renal dialysis: Secondary | ICD-10-CM | POA: Diagnosis not present

## 2023-09-07 DIAGNOSIS — N186 End stage renal disease: Secondary | ICD-10-CM | POA: Diagnosis not present

## 2023-09-07 DIAGNOSIS — N2581 Secondary hyperparathyroidism of renal origin: Secondary | ICD-10-CM | POA: Diagnosis not present

## 2023-09-09 DIAGNOSIS — Z992 Dependence on renal dialysis: Secondary | ICD-10-CM | POA: Diagnosis not present

## 2023-09-09 DIAGNOSIS — N186 End stage renal disease: Secondary | ICD-10-CM | POA: Diagnosis not present

## 2023-09-09 DIAGNOSIS — N2581 Secondary hyperparathyroidism of renal origin: Secondary | ICD-10-CM | POA: Diagnosis not present

## 2023-09-12 DIAGNOSIS — Z992 Dependence on renal dialysis: Secondary | ICD-10-CM | POA: Diagnosis not present

## 2023-09-12 DIAGNOSIS — N186 End stage renal disease: Secondary | ICD-10-CM | POA: Diagnosis not present

## 2023-09-12 DIAGNOSIS — N2581 Secondary hyperparathyroidism of renal origin: Secondary | ICD-10-CM | POA: Diagnosis not present

## 2023-09-13 DIAGNOSIS — Z96611 Presence of right artificial shoulder joint: Secondary | ICD-10-CM | POA: Diagnosis not present

## 2023-09-13 DIAGNOSIS — M1030 Gout due to renal impairment, unspecified site: Secondary | ICD-10-CM | POA: Diagnosis not present

## 2023-09-13 DIAGNOSIS — I132 Hypertensive heart and chronic kidney disease with heart failure and with stage 5 chronic kidney disease, or end stage renal disease: Secondary | ICD-10-CM | POA: Diagnosis not present

## 2023-09-13 DIAGNOSIS — I5042 Chronic combined systolic (congestive) and diastolic (congestive) heart failure: Secondary | ICD-10-CM | POA: Diagnosis not present

## 2023-09-13 DIAGNOSIS — N186 End stage renal disease: Secondary | ICD-10-CM | POA: Diagnosis not present

## 2023-09-13 DIAGNOSIS — E1122 Type 2 diabetes mellitus with diabetic chronic kidney disease: Secondary | ICD-10-CM | POA: Diagnosis not present

## 2023-09-13 DIAGNOSIS — K828 Other specified diseases of gallbladder: Secondary | ICD-10-CM | POA: Diagnosis not present

## 2023-09-13 DIAGNOSIS — I483 Typical atrial flutter: Secondary | ICD-10-CM | POA: Diagnosis not present

## 2023-09-13 DIAGNOSIS — I2510 Atherosclerotic heart disease of native coronary artery without angina pectoris: Secondary | ICD-10-CM | POA: Diagnosis not present

## 2023-09-14 DIAGNOSIS — Z992 Dependence on renal dialysis: Secondary | ICD-10-CM | POA: Diagnosis not present

## 2023-09-14 DIAGNOSIS — N186 End stage renal disease: Secondary | ICD-10-CM | POA: Diagnosis not present

## 2023-09-14 DIAGNOSIS — I483 Typical atrial flutter: Secondary | ICD-10-CM | POA: Insufficient documentation

## 2023-09-14 DIAGNOSIS — N2581 Secondary hyperparathyroidism of renal origin: Secondary | ICD-10-CM | POA: Diagnosis not present

## 2023-09-14 NOTE — Assessment & Plan Note (Signed)
Stable, continue f/u with Cardiology

## 2023-09-14 NOTE — Assessment & Plan Note (Signed)
 Stable, Continue warfarin and going to coumadin  clinic.

## 2023-09-14 NOTE — Progress Notes (Signed)
 Done, let me know if you need anything else

## 2023-09-16 DIAGNOSIS — N186 End stage renal disease: Secondary | ICD-10-CM | POA: Diagnosis not present

## 2023-09-16 DIAGNOSIS — N2581 Secondary hyperparathyroidism of renal origin: Secondary | ICD-10-CM | POA: Diagnosis not present

## 2023-09-16 DIAGNOSIS — Z992 Dependence on renal dialysis: Secondary | ICD-10-CM | POA: Diagnosis not present

## 2023-09-16 DIAGNOSIS — Z7901 Long term (current) use of anticoagulants: Secondary | ICD-10-CM | POA: Diagnosis not present

## 2023-09-16 DIAGNOSIS — I480 Paroxysmal atrial fibrillation: Secondary | ICD-10-CM | POA: Diagnosis not present

## 2023-09-19 DIAGNOSIS — Z992 Dependence on renal dialysis: Secondary | ICD-10-CM | POA: Diagnosis not present

## 2023-09-19 DIAGNOSIS — N2581 Secondary hyperparathyroidism of renal origin: Secondary | ICD-10-CM | POA: Diagnosis not present

## 2023-09-19 DIAGNOSIS — N186 End stage renal disease: Secondary | ICD-10-CM | POA: Diagnosis not present

## 2023-09-20 DIAGNOSIS — K828 Other specified diseases of gallbladder: Secondary | ICD-10-CM | POA: Diagnosis not present

## 2023-09-20 DIAGNOSIS — I132 Hypertensive heart and chronic kidney disease with heart failure and with stage 5 chronic kidney disease, or end stage renal disease: Secondary | ICD-10-CM | POA: Diagnosis not present

## 2023-09-20 DIAGNOSIS — I5042 Chronic combined systolic (congestive) and diastolic (congestive) heart failure: Secondary | ICD-10-CM | POA: Diagnosis not present

## 2023-09-20 DIAGNOSIS — I2510 Atherosclerotic heart disease of native coronary artery without angina pectoris: Secondary | ICD-10-CM | POA: Diagnosis not present

## 2023-09-20 DIAGNOSIS — I483 Typical atrial flutter: Secondary | ICD-10-CM | POA: Diagnosis not present

## 2023-09-20 DIAGNOSIS — N186 End stage renal disease: Secondary | ICD-10-CM | POA: Diagnosis not present

## 2023-09-20 DIAGNOSIS — E1122 Type 2 diabetes mellitus with diabetic chronic kidney disease: Secondary | ICD-10-CM | POA: Diagnosis not present

## 2023-09-20 DIAGNOSIS — M1030 Gout due to renal impairment, unspecified site: Secondary | ICD-10-CM | POA: Diagnosis not present

## 2023-09-21 DIAGNOSIS — Z992 Dependence on renal dialysis: Secondary | ICD-10-CM | POA: Diagnosis not present

## 2023-09-21 DIAGNOSIS — I120 Hypertensive chronic kidney disease with stage 5 chronic kidney disease or end stage renal disease: Secondary | ICD-10-CM | POA: Diagnosis not present

## 2023-09-21 DIAGNOSIS — N2581 Secondary hyperparathyroidism of renal origin: Secondary | ICD-10-CM | POA: Diagnosis not present

## 2023-09-21 DIAGNOSIS — N186 End stage renal disease: Secondary | ICD-10-CM | POA: Diagnosis not present

## 2023-09-23 DIAGNOSIS — N2581 Secondary hyperparathyroidism of renal origin: Secondary | ICD-10-CM | POA: Diagnosis not present

## 2023-09-23 DIAGNOSIS — N186 End stage renal disease: Secondary | ICD-10-CM | POA: Diagnosis not present

## 2023-09-23 DIAGNOSIS — Z992 Dependence on renal dialysis: Secondary | ICD-10-CM | POA: Diagnosis not present

## 2023-09-26 DIAGNOSIS — N186 End stage renal disease: Secondary | ICD-10-CM | POA: Diagnosis not present

## 2023-09-26 DIAGNOSIS — N2581 Secondary hyperparathyroidism of renal origin: Secondary | ICD-10-CM | POA: Diagnosis not present

## 2023-09-26 DIAGNOSIS — Z992 Dependence on renal dialysis: Secondary | ICD-10-CM | POA: Diagnosis not present

## 2023-09-27 DIAGNOSIS — E1122 Type 2 diabetes mellitus with diabetic chronic kidney disease: Secondary | ICD-10-CM | POA: Diagnosis not present

## 2023-09-27 DIAGNOSIS — I2510 Atherosclerotic heart disease of native coronary artery without angina pectoris: Secondary | ICD-10-CM | POA: Diagnosis not present

## 2023-09-27 DIAGNOSIS — K828 Other specified diseases of gallbladder: Secondary | ICD-10-CM | POA: Diagnosis not present

## 2023-09-27 DIAGNOSIS — I5042 Chronic combined systolic (congestive) and diastolic (congestive) heart failure: Secondary | ICD-10-CM | POA: Diagnosis not present

## 2023-09-27 DIAGNOSIS — I483 Typical atrial flutter: Secondary | ICD-10-CM | POA: Diagnosis not present

## 2023-09-27 DIAGNOSIS — N186 End stage renal disease: Secondary | ICD-10-CM | POA: Diagnosis not present

## 2023-09-27 DIAGNOSIS — I132 Hypertensive heart and chronic kidney disease with heart failure and with stage 5 chronic kidney disease, or end stage renal disease: Secondary | ICD-10-CM | POA: Diagnosis not present

## 2023-09-27 DIAGNOSIS — M1030 Gout due to renal impairment, unspecified site: Secondary | ICD-10-CM | POA: Diagnosis not present

## 2023-09-28 DIAGNOSIS — N186 End stage renal disease: Secondary | ICD-10-CM | POA: Diagnosis not present

## 2023-09-28 DIAGNOSIS — Z992 Dependence on renal dialysis: Secondary | ICD-10-CM | POA: Diagnosis not present

## 2023-09-28 DIAGNOSIS — N2581 Secondary hyperparathyroidism of renal origin: Secondary | ICD-10-CM | POA: Diagnosis not present

## 2023-09-30 DIAGNOSIS — Z992 Dependence on renal dialysis: Secondary | ICD-10-CM | POA: Diagnosis not present

## 2023-09-30 DIAGNOSIS — N2581 Secondary hyperparathyroidism of renal origin: Secondary | ICD-10-CM | POA: Diagnosis not present

## 2023-09-30 DIAGNOSIS — N186 End stage renal disease: Secondary | ICD-10-CM | POA: Diagnosis not present

## 2023-10-03 DIAGNOSIS — N2581 Secondary hyperparathyroidism of renal origin: Secondary | ICD-10-CM | POA: Diagnosis not present

## 2023-10-03 DIAGNOSIS — Z992 Dependence on renal dialysis: Secondary | ICD-10-CM | POA: Diagnosis not present

## 2023-10-03 DIAGNOSIS — N186 End stage renal disease: Secondary | ICD-10-CM | POA: Diagnosis not present

## 2023-10-04 DIAGNOSIS — I132 Hypertensive heart and chronic kidney disease with heart failure and with stage 5 chronic kidney disease, or end stage renal disease: Secondary | ICD-10-CM | POA: Diagnosis not present

## 2023-10-04 DIAGNOSIS — I483 Typical atrial flutter: Secondary | ICD-10-CM | POA: Diagnosis not present

## 2023-10-04 DIAGNOSIS — I2510 Atherosclerotic heart disease of native coronary artery without angina pectoris: Secondary | ICD-10-CM | POA: Diagnosis not present

## 2023-10-04 DIAGNOSIS — E1122 Type 2 diabetes mellitus with diabetic chronic kidney disease: Secondary | ICD-10-CM | POA: Diagnosis not present

## 2023-10-04 DIAGNOSIS — K828 Other specified diseases of gallbladder: Secondary | ICD-10-CM | POA: Diagnosis not present

## 2023-10-04 DIAGNOSIS — M1030 Gout due to renal impairment, unspecified site: Secondary | ICD-10-CM | POA: Diagnosis not present

## 2023-10-04 DIAGNOSIS — N186 End stage renal disease: Secondary | ICD-10-CM | POA: Diagnosis not present

## 2023-10-04 DIAGNOSIS — I5042 Chronic combined systolic (congestive) and diastolic (congestive) heart failure: Secondary | ICD-10-CM | POA: Diagnosis not present

## 2023-10-05 DIAGNOSIS — N186 End stage renal disease: Secondary | ICD-10-CM | POA: Diagnosis not present

## 2023-10-05 DIAGNOSIS — Z992 Dependence on renal dialysis: Secondary | ICD-10-CM | POA: Diagnosis not present

## 2023-10-05 DIAGNOSIS — N2581 Secondary hyperparathyroidism of renal origin: Secondary | ICD-10-CM | POA: Diagnosis not present

## 2023-10-06 ENCOUNTER — Encounter: Payer: Self-pay | Admitting: Podiatry

## 2023-10-06 ENCOUNTER — Ambulatory Visit (INDEPENDENT_AMBULATORY_CARE_PROVIDER_SITE_OTHER): Payer: Medicare PPO | Admitting: Podiatry

## 2023-10-06 DIAGNOSIS — I483 Typical atrial flutter: Secondary | ICD-10-CM | POA: Diagnosis not present

## 2023-10-06 DIAGNOSIS — K828 Other specified diseases of gallbladder: Secondary | ICD-10-CM | POA: Diagnosis not present

## 2023-10-06 DIAGNOSIS — I132 Hypertensive heart and chronic kidney disease with heart failure and with stage 5 chronic kidney disease, or end stage renal disease: Secondary | ICD-10-CM | POA: Diagnosis not present

## 2023-10-06 DIAGNOSIS — M79675 Pain in left toe(s): Secondary | ICD-10-CM

## 2023-10-06 DIAGNOSIS — M79674 Pain in right toe(s): Secondary | ICD-10-CM

## 2023-10-06 DIAGNOSIS — M1030 Gout due to renal impairment, unspecified site: Secondary | ICD-10-CM | POA: Diagnosis not present

## 2023-10-06 DIAGNOSIS — E119 Type 2 diabetes mellitus without complications: Secondary | ICD-10-CM

## 2023-10-06 DIAGNOSIS — E1122 Type 2 diabetes mellitus with diabetic chronic kidney disease: Secondary | ICD-10-CM | POA: Diagnosis not present

## 2023-10-06 DIAGNOSIS — B351 Tinea unguium: Secondary | ICD-10-CM

## 2023-10-06 DIAGNOSIS — I5042 Chronic combined systolic (congestive) and diastolic (congestive) heart failure: Secondary | ICD-10-CM | POA: Diagnosis not present

## 2023-10-06 DIAGNOSIS — N186 End stage renal disease: Secondary | ICD-10-CM | POA: Diagnosis not present

## 2023-10-06 DIAGNOSIS — I2510 Atherosclerotic heart disease of native coronary artery without angina pectoris: Secondary | ICD-10-CM | POA: Diagnosis not present

## 2023-10-06 NOTE — Progress Notes (Signed)
 This patient returns to my office for at risk foot care.  This patient requires this care by a professional since this patient will be at risk due to having DM, chronic anticoagulation and ESRD.  This patient is unable to cut nails himself since the patient cannot reach his nails.These nails are painful walking and wearing shoes.  This patient presents for at risk foot care today.  General Appearance  Alert, conversant and in no acute stress.  Vascular  Dorsalis pedis and posterior tibial  pulses are palpable  bilaterally.  Capillary return is within normal limits  bilaterally. Temperature is within normal limits  bilaterally.  Neurologic  Senn-Weinstein monofilament wire test within normal limits  bilaterally. Muscle power within normal limits bilaterally.  Nails Thick disfigured discolored nails with subungual debris  from hallux to fifth toes bilaterally. No evidence of bacterial infection or drainage bilaterally.  Orthopedic  No limitations of motion  feet .  No crepitus or effusions noted.  No bony pathology or digital deformities noted.  Skin  normotropic skin with no porokeratosis noted bilaterally.  No signs of infections or ulcers noted.     Onychomycosis  Pain in right toes  Pain in left toes  Consent was obtained for treatment procedures.   Mechanical debridement of nails 1-5  bilaterally performed with a nail nipper.  Filed with dremel without incident.    Return office visit     3   months                 Told patient to return for periodic foot care and evaluation due to potential at risk complications.   Helane Gunther DPM

## 2023-10-07 DIAGNOSIS — N2581 Secondary hyperparathyroidism of renal origin: Secondary | ICD-10-CM | POA: Diagnosis not present

## 2023-10-07 DIAGNOSIS — N186 End stage renal disease: Secondary | ICD-10-CM | POA: Diagnosis not present

## 2023-10-07 DIAGNOSIS — Z992 Dependence on renal dialysis: Secondary | ICD-10-CM | POA: Diagnosis not present

## 2023-10-10 DIAGNOSIS — N2581 Secondary hyperparathyroidism of renal origin: Secondary | ICD-10-CM | POA: Diagnosis not present

## 2023-10-10 DIAGNOSIS — N186 End stage renal disease: Secondary | ICD-10-CM | POA: Diagnosis not present

## 2023-10-10 DIAGNOSIS — Z992 Dependence on renal dialysis: Secondary | ICD-10-CM | POA: Diagnosis not present

## 2023-10-11 ENCOUNTER — Ambulatory Visit: Payer: Self-pay

## 2023-10-11 ENCOUNTER — Encounter (HOSPITAL_BASED_OUTPATIENT_CLINIC_OR_DEPARTMENT_OTHER): Payer: Self-pay | Admitting: Emergency Medicine

## 2023-10-11 ENCOUNTER — Inpatient Hospital Stay (HOSPITAL_BASED_OUTPATIENT_CLINIC_OR_DEPARTMENT_OTHER)
Admission: EM | Admit: 2023-10-11 | Discharge: 2023-10-13 | DRG: 124 | Disposition: A | Discharge status: Home / Self Care | Attending: Internal Medicine | Admitting: Internal Medicine

## 2023-10-11 ENCOUNTER — Emergency Department (HOSPITAL_BASED_OUTPATIENT_CLINIC_OR_DEPARTMENT_OTHER)

## 2023-10-11 ENCOUNTER — Other Ambulatory Visit: Payer: Self-pay

## 2023-10-11 DIAGNOSIS — I6523 Occlusion and stenosis of bilateral carotid arteries: Secondary | ICD-10-CM | POA: Diagnosis not present

## 2023-10-11 DIAGNOSIS — I2510 Atherosclerotic heart disease of native coronary artery without angina pectoris: Secondary | ICD-10-CM | POA: Diagnosis not present

## 2023-10-11 DIAGNOSIS — E1122 Type 2 diabetes mellitus with diabetic chronic kidney disease: Secondary | ICD-10-CM | POA: Diagnosis present

## 2023-10-11 DIAGNOSIS — R29818 Other symptoms and signs involving the nervous system: Secondary | ICD-10-CM | POA: Diagnosis not present

## 2023-10-11 DIAGNOSIS — I9589 Other hypotension: Secondary | ICD-10-CM | POA: Diagnosis present

## 2023-10-11 DIAGNOSIS — G319 Degenerative disease of nervous system, unspecified: Secondary | ICD-10-CM | POA: Diagnosis present

## 2023-10-11 DIAGNOSIS — I483 Typical atrial flutter: Secondary | ICD-10-CM | POA: Diagnosis not present

## 2023-10-11 DIAGNOSIS — Z992 Dependence on renal dialysis: Secondary | ICD-10-CM | POA: Diagnosis not present

## 2023-10-11 DIAGNOSIS — E785 Hyperlipidemia, unspecified: Secondary | ICD-10-CM | POA: Diagnosis present

## 2023-10-11 DIAGNOSIS — M4852XA Collapsed vertebra, not elsewhere classified, cervical region, initial encounter for fracture: Secondary | ICD-10-CM | POA: Diagnosis not present

## 2023-10-11 DIAGNOSIS — H3412 Central retinal artery occlusion, left eye: Secondary | ICD-10-CM | POA: Diagnosis not present

## 2023-10-11 DIAGNOSIS — Z9862 Peripheral vascular angioplasty status: Secondary | ICD-10-CM

## 2023-10-11 DIAGNOSIS — H34232 Retinal artery branch occlusion, left eye: Secondary | ICD-10-CM | POA: Diagnosis not present

## 2023-10-11 DIAGNOSIS — K219 Gastro-esophageal reflux disease without esophagitis: Secondary | ICD-10-CM | POA: Diagnosis present

## 2023-10-11 DIAGNOSIS — E875 Hyperkalemia: Secondary | ICD-10-CM | POA: Diagnosis not present

## 2023-10-11 DIAGNOSIS — I120 Hypertensive chronic kidney disease with stage 5 chronic kidney disease or end stage renal disease: Secondary | ICD-10-CM | POA: Diagnosis not present

## 2023-10-11 DIAGNOSIS — I340 Nonrheumatic mitral (valve) insufficiency: Secondary | ICD-10-CM | POA: Diagnosis not present

## 2023-10-11 DIAGNOSIS — H538 Other visual disturbances: Secondary | ICD-10-CM

## 2023-10-11 DIAGNOSIS — E1142 Type 2 diabetes mellitus with diabetic polyneuropathy: Secondary | ICD-10-CM | POA: Diagnosis present

## 2023-10-11 DIAGNOSIS — I429 Cardiomyopathy, unspecified: Secondary | ICD-10-CM | POA: Diagnosis not present

## 2023-10-11 DIAGNOSIS — R9089 Other abnormal findings on diagnostic imaging of central nervous system: Secondary | ICD-10-CM | POA: Diagnosis not present

## 2023-10-11 DIAGNOSIS — G629 Polyneuropathy, unspecified: Secondary | ICD-10-CM

## 2023-10-11 DIAGNOSIS — Z8249 Family history of ischemic heart disease and other diseases of the circulatory system: Secondary | ICD-10-CM

## 2023-10-11 DIAGNOSIS — I639 Cerebral infarction, unspecified: Secondary | ICD-10-CM

## 2023-10-11 DIAGNOSIS — I4892 Unspecified atrial flutter: Secondary | ICD-10-CM | POA: Diagnosis present

## 2023-10-11 DIAGNOSIS — I509 Heart failure, unspecified: Secondary | ICD-10-CM | POA: Diagnosis not present

## 2023-10-11 DIAGNOSIS — Z7901 Long term (current) use of anticoagulants: Secondary | ICD-10-CM

## 2023-10-11 DIAGNOSIS — I69341 Monoplegia of lower limb following cerebral infarction affecting right dominant side: Secondary | ICD-10-CM

## 2023-10-11 DIAGNOSIS — R1310 Dysphagia, unspecified: Secondary | ICD-10-CM | POA: Diagnosis present

## 2023-10-11 DIAGNOSIS — M898X9 Other specified disorders of bone, unspecified site: Secondary | ICD-10-CM | POA: Diagnosis present

## 2023-10-11 DIAGNOSIS — D631 Anemia in chronic kidney disease: Secondary | ICD-10-CM | POA: Diagnosis present

## 2023-10-11 DIAGNOSIS — Z888 Allergy status to other drugs, medicaments and biological substances status: Secondary | ICD-10-CM

## 2023-10-11 DIAGNOSIS — Z9889 Other specified postprocedural states: Secondary | ICD-10-CM

## 2023-10-11 DIAGNOSIS — E861 Hypovolemia: Secondary | ICD-10-CM | POA: Diagnosis not present

## 2023-10-11 DIAGNOSIS — Z8719 Personal history of other diseases of the digestive system: Secondary | ICD-10-CM

## 2023-10-11 DIAGNOSIS — M545 Low back pain, unspecified: Secondary | ICD-10-CM | POA: Diagnosis not present

## 2023-10-11 DIAGNOSIS — Z96612 Presence of left artificial shoulder joint: Secondary | ICD-10-CM | POA: Diagnosis present

## 2023-10-11 DIAGNOSIS — I69391 Dysphagia following cerebral infarction: Secondary | ICD-10-CM | POA: Diagnosis not present

## 2023-10-11 DIAGNOSIS — N186 End stage renal disease: Secondary | ICD-10-CM | POA: Diagnosis not present

## 2023-10-11 DIAGNOSIS — M1990 Unspecified osteoarthritis, unspecified site: Secondary | ICD-10-CM | POA: Diagnosis present

## 2023-10-11 DIAGNOSIS — I953 Hypotension of hemodialysis: Secondary | ICD-10-CM | POA: Diagnosis present

## 2023-10-11 DIAGNOSIS — I5042 Chronic combined systolic (congestive) and diastolic (congestive) heart failure: Secondary | ICD-10-CM | POA: Diagnosis not present

## 2023-10-11 DIAGNOSIS — H5347 Heteronymous bilateral field defects: Secondary | ICD-10-CM | POA: Diagnosis not present

## 2023-10-11 DIAGNOSIS — N2581 Secondary hyperparathyroidism of renal origin: Secondary | ICD-10-CM | POA: Diagnosis present

## 2023-10-11 DIAGNOSIS — I422 Other hypertrophic cardiomyopathy: Secondary | ICD-10-CM | POA: Diagnosis not present

## 2023-10-11 DIAGNOSIS — Z905 Acquired absence of kidney: Secondary | ICD-10-CM

## 2023-10-11 DIAGNOSIS — Z86718 Personal history of other venous thrombosis and embolism: Secondary | ICD-10-CM

## 2023-10-11 DIAGNOSIS — Z8709 Personal history of other diseases of the respiratory system: Secondary | ICD-10-CM

## 2023-10-11 DIAGNOSIS — I959 Hypotension, unspecified: Secondary | ICD-10-CM | POA: Diagnosis not present

## 2023-10-11 DIAGNOSIS — E669 Obesity, unspecified: Secondary | ICD-10-CM | POA: Diagnosis present

## 2023-10-11 DIAGNOSIS — E1151 Type 2 diabetes mellitus with diabetic peripheral angiopathy without gangrene: Secondary | ICD-10-CM | POA: Diagnosis not present

## 2023-10-11 DIAGNOSIS — Z96611 Presence of right artificial shoulder joint: Secondary | ICD-10-CM | POA: Diagnosis present

## 2023-10-11 DIAGNOSIS — H539 Unspecified visual disturbance: Secondary | ICD-10-CM | POA: Diagnosis not present

## 2023-10-11 DIAGNOSIS — M25511 Pain in right shoulder: Secondary | ICD-10-CM | POA: Diagnosis present

## 2023-10-11 DIAGNOSIS — R531 Weakness: Secondary | ICD-10-CM

## 2023-10-11 DIAGNOSIS — I132 Hypertensive heart and chronic kidney disease with heart failure and with stage 5 chronic kidney disease, or end stage renal disease: Secondary | ICD-10-CM | POA: Diagnosis present

## 2023-10-11 DIAGNOSIS — Z6831 Body mass index (BMI) 31.0-31.9, adult: Secondary | ICD-10-CM

## 2023-10-11 DIAGNOSIS — I6782 Cerebral ischemia: Secondary | ICD-10-CM | POA: Diagnosis not present

## 2023-10-11 DIAGNOSIS — I10 Essential (primary) hypertension: Secondary | ICD-10-CM | POA: Diagnosis present

## 2023-10-11 DIAGNOSIS — Z79899 Other long term (current) drug therapy: Secondary | ICD-10-CM

## 2023-10-11 DIAGNOSIS — Z9049 Acquired absence of other specified parts of digestive tract: Secondary | ICD-10-CM

## 2023-10-11 DIAGNOSIS — N250 Renal osteodystrophy: Secondary | ICD-10-CM | POA: Diagnosis not present

## 2023-10-11 DIAGNOSIS — I700 Atherosclerosis of aorta: Secondary | ICD-10-CM | POA: Diagnosis present

## 2023-10-11 DIAGNOSIS — H5461 Unqualified visual loss, right eye, normal vision left eye: Secondary | ICD-10-CM | POA: Diagnosis present

## 2023-10-11 DIAGNOSIS — M25512 Pain in left shoulder: Secondary | ICD-10-CM | POA: Diagnosis present

## 2023-10-11 DIAGNOSIS — Z87892 Personal history of anaphylaxis: Secondary | ICD-10-CM

## 2023-10-11 DIAGNOSIS — Z85528 Personal history of other malignant neoplasm of kidney: Secondary | ICD-10-CM

## 2023-10-11 DIAGNOSIS — M109 Gout, unspecified: Secondary | ICD-10-CM | POA: Diagnosis present

## 2023-10-11 DIAGNOSIS — L989 Disorder of the skin and subcutaneous tissue, unspecified: Secondary | ICD-10-CM | POA: Diagnosis present

## 2023-10-11 DIAGNOSIS — I4891 Unspecified atrial fibrillation: Secondary | ICD-10-CM | POA: Diagnosis present

## 2023-10-11 DIAGNOSIS — I252 Old myocardial infarction: Secondary | ICD-10-CM

## 2023-10-11 DIAGNOSIS — I6501 Occlusion and stenosis of right vertebral artery: Secondary | ICD-10-CM | POA: Diagnosis not present

## 2023-10-11 DIAGNOSIS — I420 Dilated cardiomyopathy: Secondary | ICD-10-CM | POA: Diagnosis not present

## 2023-10-11 DIAGNOSIS — Z713 Dietary counseling and surveillance: Secondary | ICD-10-CM

## 2023-10-11 DIAGNOSIS — I672 Cerebral atherosclerosis: Secondary | ICD-10-CM | POA: Diagnosis not present

## 2023-10-11 DIAGNOSIS — Z993 Dependence on wheelchair: Secondary | ICD-10-CM

## 2023-10-11 DIAGNOSIS — Z841 Family history of disorders of kidney and ureter: Secondary | ICD-10-CM

## 2023-10-11 DIAGNOSIS — Z87448 Personal history of other diseases of urinary system: Secondary | ICD-10-CM

## 2023-10-11 LAB — COMPREHENSIVE METABOLIC PANEL WITH GFR
ALT: 6 U/L (ref 0–44)
AST: 12 U/L — ABNORMAL LOW (ref 15–41)
Albumin: 3.9 g/dL (ref 3.5–5.0)
Alkaline Phosphatase: 116 U/L (ref 38–126)
Anion gap: 14 (ref 5–15)
BUN: 25 mg/dL — ABNORMAL HIGH (ref 6–20)
CO2: 28 mmol/L (ref 22–32)
Calcium: 9.7 mg/dL (ref 8.9–10.3)
Chloride: 94 mmol/L — ABNORMAL LOW (ref 98–111)
Creatinine, Ser: 7.66 mg/dL — ABNORMAL HIGH (ref 0.61–1.24)
GFR, Estimated: 7 mL/min — ABNORMAL LOW (ref >60)
Glucose, Bld: 94 mg/dL (ref 70–99)
Potassium: 5.1 mmol/L (ref 3.5–5.1)
Sodium: 136 mmol/L (ref 135–145)
Total Bilirubin: 0.6 mg/dL (ref 0.0–1.2)
Total Protein: 8.2 g/dL — ABNORMAL HIGH (ref 6.5–8.1)

## 2023-10-11 LAB — MRSA NEXT GEN BY PCR, NASAL: MRSA by PCR Next Gen: NOT DETECTED

## 2023-10-11 LAB — CBC
HCT: 36.9 % — ABNORMAL LOW (ref 39.0–52.0)
Hemoglobin: 11.6 g/dL — ABNORMAL LOW (ref 13.0–17.0)
MCH: 29.5 pg (ref 26.0–34.0)
MCHC: 31.4 g/dL (ref 30.0–36.0)
MCV: 93.9 fL (ref 80.0–100.0)
Platelets: 218 10*3/uL (ref 150–400)
RBC: 3.93 MIL/uL — ABNORMAL LOW (ref 4.22–5.81)
RDW: 17.2 % — ABNORMAL HIGH (ref 11.5–15.5)
WBC: 4.9 10*3/uL (ref 4.0–10.5)
nRBC: 0 % (ref 0.0–0.2)

## 2023-10-11 LAB — MAGNESIUM: Magnesium: 2.3 mg/dL (ref 1.7–2.4)

## 2023-10-11 LAB — PHOSPHORUS: Phosphorus: 5.3 mg/dL — ABNORMAL HIGH (ref 2.5–4.6)

## 2023-10-11 LAB — CBG MONITORING, ED: Glucose-Capillary: 81 mg/dL (ref 70–99)

## 2023-10-11 LAB — PROTIME-INR
INR: 1.7 — ABNORMAL HIGH (ref 0.8–1.2)
Prothrombin Time: 20.4 s — ABNORMAL HIGH (ref 11.4–15.2)

## 2023-10-11 LAB — GLUCOSE, CAPILLARY: Glucose-Capillary: 86 mg/dL (ref 70–99)

## 2023-10-11 MED ORDER — ACETAMINOPHEN 325 MG PO TABS
650.0000 mg | ORAL_TABLET | Freq: Four times a day (QID) | ORAL | Status: DC | PRN
  Administered 2023-10-12 – 2023-10-13 (×3): 650 mg via ORAL
  Filled 2023-10-11 (×3): qty 2

## 2023-10-11 MED ORDER — PROCHLORPERAZINE EDISYLATE 10 MG/2ML IJ SOLN: 5.0000 mg | Freq: Four times a day (QID) | INTRAMUSCULAR | Status: DC | PRN

## 2023-10-11 MED ORDER — WARFARIN SODIUM 5 MG PO TABS: 10.0000 mg | ORAL_TABLET | Freq: Once | ORAL | Status: DC

## 2023-10-11 MED ORDER — WARFARIN - PHARMACIST DOSING INPATIENT: Freq: Every day | Status: DC

## 2023-10-11 MED ORDER — LORAZEPAM 2 MG/ML IJ SOLN
0.5000 mg | Freq: Four times a day (QID) | INTRAMUSCULAR | Status: DC | PRN
  Administered 2023-10-12: 0.5 mg via INTRAVENOUS
  Filled 2023-10-11: qty 1

## 2023-10-11 MED ORDER — POLYETHYLENE GLYCOL 3350 17 G PO PACK: 17.0000 g | PACK | Freq: Every day | ORAL | Status: DC | PRN

## 2023-10-11 MED ORDER — MELATONIN 5 MG PO TABS: 5.0000 mg | ORAL_TABLET | Freq: Every evening | ORAL | Status: DC | PRN

## 2023-10-11 NOTE — Progress Notes (Signed)
 Admit re: ESRD on HD M/W/F  pt with c/o black spot in left eye. NO other neurological complaints and non focal per report.   Pt is on coumadin  for flutter. INR 1.7 Dr.stack recommend admit for stroke workup.  Fam med patient and h/o stroke left upper visual field abnormality.  Pt is accepted to med tele.  Plz consult  neurology once pt arrives along with hospitalist.

## 2023-10-11 NOTE — ED Triage Notes (Signed)
 Decreased vision Top left field of "shaddowy" Reports double vision Weakness Started sunday

## 2023-10-11 NOTE — ED Notes (Signed)
Report given to [facility] 

## 2023-10-11 NOTE — Consult Note (Signed)
 NEUROLOGY CONSULT NOTE   Date of service: Oct 11, 2023 Patient Name: [name] MRN:  [id number] DOB:  [birth year] Chief Complaint: "concern for stroke" Requesting Provider: Bary Boss, DO  History of Present Illness  [name] is a 63 y.o. male with hx of DM2, neuropathy, CHF, cardiomyopathy, ESRD on HD, CAD, GERD who woke up on Saturday morning with a small ark spot with no vision in the center of his left eye. Around mid day, the left eye vision is blurry. He see's redness when he closes his left eye. He came to the ED for further evaluation and workup as this was persistent.  In addition to left eye vision issues, he also endorses generalized weakness that has been ongoing for more than 2 years. He reports he is unsteady when he is walking. He denies any bowel incontinence, makes little urine secondary to ESRD. No saddle anesthesia.  He takes warfarin for his heart. He reports that his INR goal is 2-3. Most recently, reports INR was 1.7.  CT head was negative for acute abnormalities. Case was discussed with Dr. Veleta Gerold with neurology who felt this was a potential CRAO/Amourosis and patient was transferred to [facility] for workup. It does not seem like this was ever discussed with ophthalmology despite this being a mono-ocular vision deficit.  Patient reports prior hx of accident which resulted in significant limitation of his R eye vision. He can only make outlines of objects with his R eye.  LKW: 3 days ago Modified rankin score: 1-No significant post stroke disability and can perform usual duties with stroke symptoms IV Thrombolysis: not offered, outside window. Unclear if this truly is a stroke. EVT: not offered, low suspicion for LVO.  NIHSS components Score: Comment  1a Level of Conscious 0[x]  1[]  2[]  3[]      1b LOC Questions 0[x]  1[]  2[]       1c LOC Commands 0[x]  1[]  2[]       2 Best Gaze 0[x]  1[]  2[]       3 Visual 0[]  1[x]  2[]  3[]      4 Facial Palsy 0[x]  1[]  2[]  3[]       5a Motor Arm - left 0[x]  1[]  2[]  3[]  4[]  UN[]    5b Motor Arm - Right 0[x]  1[]  2[]  3[]  4[]  UN[]    6a Motor Leg - Left 0[x]  1[]  2[]  3[]  4[]  UN[]    6b Motor Leg - Right 0[x]  1[]  2[]  3[]  4[]  UN[]    7 Limb Ataxia 0[x]  1[]  2[]  UN[]      8 Sensory 0[x]  1[]  2[]  UN[]      9 Best Language 0[x]  1[]  2[]  3[]      10 Dysarthria 0[x]  1[]  2[]  UN[]      11 Extinct. and Inattention 0[x]  1[]  2[]       TOTAL: 1      ROS  Comprehensive ROS performed and pertinent positives documented in HPI   Past History   Past Medical History:  Diagnosis Date   Acute lower GI bleeding 06/21/2018   Acute respiratory failure with hypoxia (HCC) 01/13/2015   Anaphylactic shock, unspecified, subsequent encounter 01/31/2020   Arthritis    Atrial flutter (HCC)    Bile leak, postoperative 03/15/2016   Biliary dyskinesia 02/12/2016   Blind right eye    Hx: of partial blindness in right eye   Cardiomyopathy    CHF (congestive heart failure) (HCC)    Coronary artery disease    normal coronaries by 10/10/08 cath, Cardiac Cath 08-04-12 epic.Dr. Sharyn Deforest follows   Diabetes  mellitus    pt. states he's borderline diabetic., no longer taking med,- off med. since 2013   Dialysis patient [facility])    Mon-Wed-Fri([facility])- Left AV fistula   Diverticulitis 03/2015   reoccurred in December 2016   Diverticulitis of intestine without perforation or abscess without bleeding 04/21/2015   Dizziness 11/22/2018   DVT (deep venous thrombosis) (HCC)    ESRD (end stage renal disease) (HCC)    GERD (gastroesophageal reflux disease)    Gout    Hemorrhage of left kidney 01/13/2018   History of nephrectomy 07/04/2012   History of right nephrectomy in 2002 for renal cell carcinoma    History of unilateral nephrectomy    Hypertension    Hypotension due to hypovolemia 06/04/2020   Low iron     Myocardial infarction [facility]) ?2006   Renal cell carcinoma    dialysis- [facility]- [address]- Dr. Harry Lindau follows.   Renal insufficiency     Shortness of breath 05/19/2011   "at rest, lying down, w/exertion"   Stroke [facility]) 02/2011   05/19/11 denies residual   Umbilical hernia 05/19/2011   unrepaired   Wears glasses     Past Surgical History:  Procedure Laterality Date   A/V FISTULAGRAM N/A 06/30/2023   Procedure: A/V Fistulagram;  Surgeon: Patrick Boor, MD;  Location: [facility] INVASIVE CV LAB;  Service: Cardiovascular;  Laterality: N/A;   ANAL FISTULOTOMY  08/15/2018   AV FISTULA PLACEMENT  03/29/2011   Procedure: ARTERIOVENOUS (AV) FISTULA CREATION;  Surgeon: Arleta Lade, MD;  Location: [facility] OR;  Service: Vascular;  Laterality: Left;  LEFT RADIOCEPHALIC , Arteriovenous [phone])   CARDIAC CATHETERIZATION  05/21/11   CARDIAC CATHETERIZATION N/A 03/16/2016   Procedure: Left Heart Cath and Coronary Angiography;  Surgeon: Pasqual Bone, MD;  Location: [facility] INVASIVE CV LAB;  Service: Cardiovascular;  Laterality: N/A;   CHOLECYSTECTOMY N/A 02/12/2016   Procedure: LAPAROSCOPIC CHOLECYSTECTOMY WITH INTRAOPERATIVE CHOLANGIOGRAM;  Surgeon: Adalberto Hollow, MD;  Location: [facility] OR;  Service: General;  Laterality: N/A;   COLONOSCOPY WITH PROPOFOL  N/A 02/01/2017   Procedure: COLONOSCOPY WITH PROPOFOL ;  Surgeon: Tami Falcon, MD;  Location: [facility] ENDOSCOPY;  Service: Endoscopy;  Laterality: N/A;   dialysis cath placed     ESOPHAGOGASTRODUODENOSCOPY (EGD) WITH PROPOFOL  N/A 12/02/2015   Procedure: ESOPHAGOGASTRODUODENOSCOPY (EGD) WITH PROPOFOL ;  Surgeon: Tami Falcon, MD;  Location: [facility] ENDOSCOPY;  Service: Endoscopy;  Laterality: N/A;   EVALUATION UNDER ANESTHESIA WITH HEMORRHOIDECTOMY N/A 08/15/2018   Procedure: HEMORRHOID LIGATION/PEXY;  Surgeon: Candyce Champagne, MD;  Location: [facility] OR;  Service: General;  Laterality: N/A;   FINGER SURGERY     L pinkie finger- ORIF- /w remaining hardware - 1990's     FISTULOTOMY N/A 08/15/2018   Procedure: SUPERFICIAL ANAL FISTULOTOMY;  Surgeon: Candyce Champagne, MD;  Location: [facility] OR;  Service: General;  Laterality: N/A;    HEMORRHOID SURGERY     HERNIA REPAIR N/A 07/04/2012   Umbilical hernia repair   INSERTION OF DIALYSIS CATHETER N/A 01/27/2016   Procedure: INSERTION OF DIALYSIS CATHETER;  Surgeon: Adine Hoof, MD;  Location: [facility] OR;  Service: Vascular;  Laterality: N/A;   INSERTION OF MESH N/A 07/04/2012   Procedure: INSERTION OF MESH;  Surgeon: Evander Hills, DO;  Location: [facility] OR;  Service: General;  Laterality: N/A;   IR EMBO ART  VEN HEMORR LYMPH EXTRAV  INC GUIDE ROADMAPPING  01/13/2018   IR FLUORO GUIDE CV LINE RIGHT  01/13/2018   IR IVC FILTER PLMT / S&I Dan Dun GUID/MOD SED  01/27/2018  IR RENAL SELECTIVE  UNI INC S&I MOD SED  01/13/2018   IR US  GUIDE VASC ACCESS RIGHT  01/13/2018   KIDNEY CYST REMOVAL  2019   LAPAROSCOPIC LYSIS OF ADHESIONS N/A 02/12/2016   Procedure: LAPAROSCOPIC LYSIS OF ADHESIONS;  Surgeon: Adalberto Hollow, MD;  Location: [facility] OR;  Service: General;  Laterality: N/A;   LEFT AND RIGHT HEART CATHETERIZATION WITH CORONARY ANGIOGRAM N/A 08/04/2012   Procedure: LEFT AND RIGHT HEART CATHETERIZATION WITH CORONARY ANGIOGRAM;  Surgeon: Darrold Emms, MD;  Location: [facility] CATH LAB;  Service: Cardiovascular;  Laterality: N/A;   NEPHRECTOMY  2000   right   PERIPHERAL VASCULAR BALLOON ANGIOPLASTY Left 06/30/2023   Procedure: PERIPHERAL VASCULAR BALLOON ANGIOPLASTY;  Surgeon: Patrick Boor, MD;  Location: [facility] INVASIVE CV LAB;  Service: Cardiovascular;  Laterality: Left;  60% cephalic vein   REVERSE SHOULDER ARTHROPLASTY Right 09/20/2022   Procedure: REVERSE SHOULDER ARTHROPLASTY;  Surgeon: Janeth Medicus, MD;  Location: [facility] OR;  Service: Orthopedics;  Laterality: Right;  120   REVERSE SHOULDER ARTHROPLASTY Left 03/18/2023   Procedure: REVERSE SHOULDER ARTHROPLASTY;  Surgeon: Janeth Medicus, MD;  Location: [facility] OR;  Service: Orthopedics;  Laterality: Left;  120   REVISON OF ARTERIOVENOUS FISTULA Left 06/05/2013   Procedure: REVISON OF LEFT RADIOCEPHALIC  ARTERIOVENOUS FISTULA;  Surgeon: Arvil Lauber, MD;  Location: [facility] OR;  Service: Vascular;  Laterality: Left;   REVISON OF ARTERIOVENOUS FISTULA Left 01/27/2016   Procedure: REVISION OF LEFT UPPER EXTREMITY ARTERIOVENOUS FISTULA;  Surgeon: Adine Hoof, MD;  Location: [facility] OR;  Service: Vascular;  Laterality: Left;   REVISON OF ARTERIOVENOUS FISTULA Left 08/03/2016   Procedure: REVISON OF Left arm ARTERIOVENOUS FISTULA;  Surgeon: Richrd Char, MD;  Location: [facility] OR;  Service: Vascular;  Laterality: Left;   REVISON OF ARTERIOVENOUS FISTULA Left 05/06/2017   Procedure: REVISION PLICATION OF ARTERIOVENOUS FISTULA LEFT ARM;  Surgeon: Mayo Speck, MD;  Location[facility] OR;  Service: Vascular;  Laterality: Left;   REVISON OF ARTERIOVENOUS FISTULA Left 02/01/2019   Procedure: REVISION OF ARTERIOVENOUS FISTULA LEFT ARM;  Surgeon: Young Hensen, MD;  Location: [facility] OR;  Service: Vascular;  Laterality: Left;   REVISON OF ARTERIOVENOUS FISTULA Left 03/15/2019   Procedure: REVISION PLICATION OF ARTERIOVENOUS FISTULA LEFT ARM;  Surgeon: Young Hensen, MD;  Location: [facility] OR;  Service: Vascular;  Laterality: Left;   RIGHT HEART CATHETERIZATION N/A 05/21/2011   Procedure: RIGHT HEART CATH;  Surgeon: Darrold Emms, MD;  Location: [facility] CATH LAB;  Service: Cardiovascular;  Laterality: N/A;   smashed  1990's   "left pinky; have a plate in there"   UMBILICAL HERNIA REPAIR N/A 07/04/2012   Procedure: LAPAROSCOPIC UMBILICAL HERNIA;  Surgeon: Evander Hills, DO;  Location: [facility] OR;  Service: General;  Laterality: N/A;   US  ECHOCARDIOGRAPHY  05/20/11    Family History: Family History  Problem Relation Age of Onset   Hypertension Mother    Kidney disease Mother     Social History  reports that he has never smoked. He has never used smokeless tobacco. He reports current drug use. Drug: Hydrocodone . He reports that he does not drink alcohol.  Allergies  Allergen Reactions   Ace Inhibitors Itching, Cough and Other (See Comments)     Medications   Current Facility-Administered Medications:    acetaminophen  (TYLENOL ) tablet 650 mg, 650 mg, Oral, Q6H PRN, Hall, Carole N, DO   melatonin tablet 5 mg, 5 mg, Oral, QHS PRN, Bary Boss, DO  polyethylene glycol (MIRALAX  / GLYCOLAX ) packet 17 g, 17 g, Oral, Daily PRN, Hall, Carole N, DO   prochlorperazine  (COMPAZINE ) injection 5 mg, 5 mg, Intravenous, Q6H PRN, [place], Carole N, DO   warfarin (COUMADIN ) tablet 10 mg, 10 mg, Oral, ONCE-1600, Yvonne Hering, RPH   Warfarin - Pharmacist Dosing Inpatient, , Does not apply, q1600, Yvonne Hering, RPH  Vitals   Vitals:   2023/10/20 1900 2023-10-20 1930 10/20/23 1935 10-20-2023 2028  BP: (!) 110/59  113/61 (!) 144/82  Pulse: 83  82 80  Resp: 19  18 18   Temp:  98.2 F (36.8 C)  98.2 F (36.8 C)  TempSrc:  Oral  Oral  SpO2: 99%  99% 100%  Weight:    105.6 kg  Height:    6\' 1"  (1.854 m)    Body mass index is 30.7 kg/m.  Physical Exam   General: Laying comfortably in bed; in no acute distress.  HENT: Normal oropharynx and mucosa. Normal external appearance of ears and nose.  Neck: Supple, no pain or tenderness  CV: No JVD. No peripheral edema.  Pulmonary: Symmetric Chest rise. Normal respiratory effort.  Abdomen: Soft to touch, non-tender.  Ext: No cyanosis, edema, or deformity  Skin: No rash. Normal palpation of skin.   Musculoskeletal: Normal digits and nails by inspection. No clubbing.   Neurologic Examination  Mental status/Cognition: Alert, oriented to self, place, month and year, good attention.  Speech/language: Fluent, comprehension intact, object naming intact, repetition intact.  Cranial nerves:   CN II Pupils equal and reactive to light, no VF deficits. Endorses blurry vision.   CN III,IV,VI EOM intact, no gaze preference or deviation, no nystagmus    CN V normal sensation in V1, V2, and V3 segments bilaterally    CN VII no asymmetry, no nasolabial fold flattening    CN VIII normal hearing to speech    CN  IX & X normal palatal elevation, no uvular deviation    CN XI 5/5 head turn and 5/5 shoulder shrug bilaterally    CN XII midline tongue protrusion    Motor:  Muscle bulk: normal, tone normal, pronator drift none tremor none Mvmt Root Nerve  Muscle Right Left Comments  SA C5/6 Ax Deltoid 3 3 BL shoulder pain and surgeries  EF C5/6 Mc Biceps 4+ 4+   EE C6/7/8 Rad Triceps 4+ 4+   WF C6/7 Med FCR     WE C7/8 PIN ECU     F Ab C8/T1 U ADM/FDI 4+ 4+   HF L1/2/3 Fem Illopsoas 4+ 4+   KE L2/3/4 Fem Quad 4+ 4+   DF L4/5 D Peron Tib Ant 4+ 4+   PF S1/2 Tibial Grc/Sol 4+ 4+    Reflexes:  Right Left Comments  Pectoralis      Biceps (C5/6) 2 2   Brachioradialis (C5/6) 2 2    Triceps (C6/7) 2 2    Patellar (L3/4) 3 3    Achilles (S1) 1 1    Hoffman      Plantar     Jaw jerk    Sensation:  Light touch Intact throughout   Pin prick    Temperature    Vibration   Proprioception    Coordination/Complex Motor:  - Finger to Nose intact BL - Heel to shin intact BL - Rapid alternating movement are normal - Gait: deferred.  Labs/Imaging/Neurodiagnostic studies   CBC:  Recent Labs  Lab 10-20-23 1225  WBC 4.9  HGB 11.6*  HCT  36.9*  MCV 93.9  PLT 218   Basic Metabolic Panel:  Lab Results  Component Value Date   NA 136 10/11/2023   K 5.1 10/11/2023   CO2 28 10/11/2023   GLUCOSE 94 10/11/2023   BUN 25 (H) 10/11/2023   CREATININE 7.66 (H) 10/11/2023   CALCIUM  9.7 10/11/2023   GFRNONAA 7 (L) 10/11/2023   GFRAA 6 (L) 11/15/2019   Lipid Panel:  Lab Results  Component Value Date   LDLCALC 100 (H) 07/05/2023   HgbA1c:  Lab Results  Component Value Date   HGBA1C 5.0 07/05/2023   Urine Drug Screen:     Component Value Date/Time   LABOPIA NONE DETECTED 10/05/2012 1701   COCAINSCRNUR NONE DETECTED 10/05/2012 1701   LABBENZ NONE DETECTED 10/05/2012 1701   AMPHETMU NONE DETECTED 10/05/2012 1701   THCU NONE DETECTED 10/05/2012 1701   LABBARB NONE DETECTED 10/05/2012 1701     Alcohol Level     Component Value Date/Time   ETH <11 10/05/2012 1132   INR  Lab Results  Component Value Date   INR 1.7 (H) 10/11/2023   APTT  Lab Results  Component Value Date   APTT 141 (H) 05/24/2022   AED levels: No results found for: "PHENYTOIN", "ZONISAMIDE", "LAMOTRIGINE", "LEVETIRACETA"  CT Head without contrast(Personally reviewed): CTH was negative for a large hypodensity concerning for a large territory infarct or hyperdensity concerning for an ICH  MRI Brain: Pending.  MRI C spine(pending): pending  ASSESSMENT   [name] is a 63 y.o. male with hx of DM2, neuropathy, CHF, cardiomyopathy, ESRD on HD, CAD, GERD who woke up on Saturday morning with a small ark spot with no vision in the center of his left eye. Around mid day, the left eye vision is blurry. He see's redness when he closes his left eye. He came to the ED for further evaluation and workup as this was persistent.  Presentation is atypical for CRAO/BRAO. I would request discussion with optho and potential optho evaluation, specially since he reports redness in his vision when he closes his L eye. IF optho is concerned about CRAO/BRAO, then would recommend full stroke workup.  He also has significant weakness in all extremities. This seems out of proportion to his shoulder pain and I am not entirely convinced that deconditioning would explain his weakness. Weakness does increase risk of falls which would be high risk given he is on warfarin. Will get MRI Brain and MR C spine to evaluate for central etiology. Will get nutritional labs.  RECOMMENDATIONS  - MRI Brain and C spine. - recommend discussion with optho, presentation of his L eye vision deficit based on history is somewhat atypical for CRAO/BRAO. - TSH, B12, Folate, Thiamine, B6, SPEP. - If optho thinks this is CRAO/BRAO, will need full stroke workup. - outpatient EMG/NCS along with neurology follow  up. ______________________________________________________________________    Signed, Kepler Mccabe, MD [facility]

## 2023-10-11 NOTE — Progress Notes (Signed)
 PHARMACY - ANTICOAGULATION CONSULT NOTE  Pharmacy Consult for Warfarin  Indication: atrial fibrillation  Allergies  Allergen Reactions   Ace Inhibitors Itching, Cough and Other (See Comments)    Patient Measurements: Height: 6\' 1"  (185.4 cm) Weight: 105.6 kg (232 lb 11.2 oz) IBW/kg (Calculated) : 79.9 HEPARIN  DW (KG): 101.6  Vital Signs: Temp: 98.2 F (36.8 C) (05/20 2028) Temp Source: Oral (05/20 2028) BP: 144/82 (05/20 2028) Pulse Rate: 80 (05/20 2028)  Labs: Recent Labs    10/11/23 1225  HGB 11.6*  HCT 36.9*  PLT 218  LABPROT 20.4*  INR 1.7*  CREATININE 7.66*    Estimated Creatinine Clearance: 13.1 mL/min (A) (by C-G formula based on SCr of 7.66 mg/dL (H)).   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 Sharyn Deforest follows   Diabetes mellitus    pt. states he's borderline diabetic., no longer taking med,- off med. since 2013   Dialysis patient [facility])    Mon-Wed-Fri([facility])- Left AV fistula   Diverticulitis 03/2015   reoccurred in December 2016   Diverticulitis of intestine without perforation or abscess without bleeding 04/21/2015   Dizziness 11/22/2018   DVT (deep venous thrombosis) (HCC)    ESRD (end stage renal disease) (HCC)    GERD (gastroesophageal reflux disease)    Gout    Hemorrhage of left kidney 01/13/2018   History of nephrectomy 07/04/2012   History of right nephrectomy in 2002 for renal cell carcinoma    History of unilateral nephrectomy     Hypertension    Hypotension due to hypovolemia 06/04/2020   Low iron     Myocardial infarction [facility]) ?2006   Renal cell carcinoma    d[facility]- [facility]- [address]- Dr. Harry Lindau follows.   Renal insufficiency    Shortness of breath 05/19/2011   "at rest, lying down, w/exertion"   Stroke [facility]) 02/2011   05/19/11 denies residual   Umbilical hernia 05/19/2011   unrepaired   Wears glasses     Medications:  Medications Prior to Admission  Medication Sig Dispense Refill Last Dose/Taking   acetaminophen  (TYLENOL ) 500 MG tablet Take 500 mg by mouth as needed.   10/10/2023   albuterol  (VENTOLIN  HFA) 108 (90 Base) MCG/ACT inhaler Inhale 2 puffs into the lungs every 6 (six) hours as needed for wheezing or shortness of breath. 18 g 2 Taking As Needed   calcium  acetate (PHOSLO ) 667 MG capsule Take 1-3 capsules (667-2,001 mg total) by mouth See admin instructions. Take 2001 mg capsules with meals three times daily and 667 mg capsule with snacks.   10/10/2023   digoxin  (LANOXIN ) 0.125 MG tablet Take 1 tablet (0.125 mg total) by mouth every Monday, Wednesday, and Friday. 30 tablet 1 10/10/2023   gabapentin  (NEURONTIN ) 300 MG capsule Take 1 capsule (300 mg total) by mouth at bedtime. 90 capsule 1 10/10/2023   loperamide  (IMODIUM  A-D) 2 MG tablet Take 1 tablet (2 mg total) by mouth 4 (four) times daily as needed for diarrhea or loose  stools. Also available OTC. 30 tablet 0 10/10/2023   midodrine  (PROAMATINE ) 10 MG tablet Take 1 tablet (10 mg total) by mouth 3 (three) times daily. 90 tablet 3 10/10/2023   pantoprazole  (PROTONIX ) 40 MG tablet Take 40 mg by mouth 2 (two) times daily.   Past Week   warfarin (COUMADIN ) 7.5 MG tablet Take 7.5 mg by mouth daily.   10/10/2023   Control Gel Formula Dressing (DUODERM CGF EXTRA THIN) MISC Apply 1 each topically daily as needed. (Patient not taking: Reported on 10/11/2023) 10 each 2 Not Taking   glucose blood test strip Use to check blood sugar 3 times a day. Dx code  e11.65 100 each 12    HYDROcodone -acetaminophen  (NORCO) 7.5-325 MG tablet Take 1 tablet by mouth every 6 (six) hours as needed for moderate pain. (Patient not taking: Reported on 10/11/2023) 28 tablet 0 Not Taking   Lactobacillus (ACIDOPHILUS/BIFIDUS) WAFR Take 1 Wafer by mouth daily. (Patient not taking: Reported on 10/11/2023) 90 Wafer 1 Not Taking   Lancets (ONETOUCH DELICA PLUS LANCET33G) MISC Use to check blood sugar 3 times a day. Dx code e11.65 100 each 3    lidocaine  (LIDODERM ) 5 % Place 1 patch onto the skin daily. Remove & Discard patch within 12 hours or as directed by MD (Patient not taking: Reported on 10/11/2023) 30 patch 0 Not Taking   meclizine  (ANTIVERT ) 25 MG tablet Take 1 tablet (25 mg total) by mouth 3 (three) times daily as needed for dizziness. (Patient not taking: Reported on 10/11/2023) 30 tablet 0 Not Taking   metoprolol  succinate (TOPROL -XL) 25 MG 24 hr tablet Take 1 tablet (25 mg total) by mouth daily. (Patient not taking: Reported on 10/11/2023) 30 tablet 3 Not Taking   ondansetron  (ZOFRAN  ODT) 4 MG disintegrating tablet Take 1 tablet (4 mg total) by mouth every 8 (eight) hours as needed for nausea or vomiting. (Patient not taking: Reported on 10/11/2023) 20 tablet 0 Not Taking   ondansetron  (ZOFRAN ) 4 MG tablet Take 1 tablet (4 mg total) by mouth every 6 (six) hours. (Patient not taking: Reported on 10/11/2023) 12 tablet 0 Not Taking   oxyCODONE  (ROXICODONE ) 5 MG immediate release tablet Take 1 tablet (5 mg total) by mouth every 6 (six) hours as needed for moderate pain (pain score 4-6) or severe pain (pain score 7-10). (Patient not taking: Reported on 10/11/2023) 30 tablet 0 Not Taking   UNABLE TO FIND Out patient physical therapy- vestibular therapy for BPPV 1 each 0     Assessment: Admitted 10/11/23 for CVA workup due to complaint of  black spot in left eye, vision loss.  5/20 CT head:  no acute intracranial hemorrhage, No acute intracranial finding.  Unchanged chronic lacunar  infarcts within the left lentiform nucleus and adjacent white matter, and within the pons.  Pharmacy consult to dose  Warfarin for  nonvalvular Afib.  He is taking Warfarin priot to admission.    His PTA dose Warfarin dose is listed as 7.5mg  daily, last taken 10/10/23. However, recent prescription fill hx is  for Warfarin 10mg  daily last filled 10/02/23. INR = 1.7, which is subtherapeutic on admit 10/11/23.  Hgb 11.6, pltc 218K . No bleeding reported.  Goal of Therapy:   INR 2-3 Monitor platelets by anticoagulation protocol: Yes   Plan:  Give  Warfarin 10mg  x1 tonight.  Monitor daily protime/INR   Thank you for allowing pharmacy to be part of this patients care team. Alisa Irish, RPh Clinical Pharmacist 10/11/2023,8:54 PM Please check  AMION for all [facility] Pharmacy phone numbers After 10:00 PM, call Main Pharmacy [phone]

## 2023-10-11 NOTE — ED Notes (Signed)
 Neuro - Right arm/leg slight sensation difference, right eye has sight issue already / nothing worsened, left eye upper part of eye black / lower blurry.Matthew AasAaron Stephenson

## 2023-10-11 NOTE — Consult Note (Signed)
 Consult to neurology Doretta Gant [phone] [address] [place])

## 2023-10-11 NOTE — Telephone Encounter (Signed)
 Chief Complaint:  Vision changes  Symptoms:  Double vision, flashing lights, floaters, black curtain effect.  Frequency:  Few days ago   Patient denies  Weakness in extremities, eye injury, discharge  Disposition: [ X]ED /[ ] Urgent Care (no appt availability in office) / [ ] Appointment(In office/virtual)/ [ ]  [facility]/ [ ] Home Care/ [ ] Refused Recommended Disposition /[ ]  Mobile Bus/ [ ]  Follow-up with PCP   Additional Notes:  Patient wife will drive the patient to the ER appropriately. No additional questions/concerns noted during the time of the call.    Complete triage note below:       Copied from CRM [id number]. Topic: Clinical - Red Word Triage >> Oct 11, 2023  9:48 AM [name] wrote: Red Word that prompted transfer to Nurse Triage: Patient states yesterday he was seeing spots, but woke up today with a headache and is experiencing double,blurred vision in his right eye. Reason for Disposition  [1] Blurred vision or visual changes AND [2] present now AND [3] sudden onset or new (e.g., minutes, hours, days)  (Exception: Seeing floaters / black specks OR previously diagnosed migraine headaches with same symptoms.)  Answer Assessment - Initial Assessment Questions 1. DESCRIPTION: "How has your vision changed?" (e.g., complete vision loss, blurred vision, double vision, floaters, etc.)     ---Initially felt like there was something in it, they used eye drops (Visine), now having    2. LOCATION: "One or both eyes?" If one, ask: "Which eye?"     ----Right eye    3. SEVERITY: "Can you see anything?" If Yes, ask: "What can you see?" (e.g., fine print)     --No    4. ONSET: "When did this begin?" "Did it start suddenly or has this been gradual?"     Last night or two nights ago   5. PATTERN: "Does this come and go, or has it been constant since it started?"       6. PAIN: "Is there any pain in your eye(s)?"  (Scale 1-10; or mild, moderate,  severe)   ---- Denies   7. CONTACTS-GLASSES: "Do you wear contacts or glasses?"     -----Glasses    8. CAUSE: "What do you think is causing this visual problem?"     Unsure   9. OTHER SYMPTOMS: "Do you have any other symptoms?" (e.g., confusion, headache, arm or leg weakness, speech problems)     -Double vision, seeing spots, lights, headache ( today) -----Half of the vision is gone... dark on one side  Protocols used: Vision Loss or Change-A-AH

## 2023-10-11 NOTE — H&P (Signed)
 History and Physical  [name] GNF:[id number] DOB: [birth year] DOA: 10/11/2023  Referring physician: Accepted by Dr. Brunilda Capra, TRH,   PCP: Susanna Epley, FNP  Outpatient Specialists: Nephrology. Patient coming from: Home.  Chief Complaint: Concern for stroke.  HPI: [name] is a 63 y.o. male with medical history significant for end-stage renal disease on hemodialysis Monday Wednesday Friday, atrial flutter on Coumadin , type 2 diabetes, diabetic polyneuropathy, chronic HFpEF, cardiomyopathy, coronary artery disease, GERD, who initially presented to [facility] ED with complaints of left eye vision changes.  Endorses he had a sudden onset complete vision loss involving the center of his left eye after waking up on Saturday morning 4 days ago.  6 to 8 hours later the vision in his left eye became blurry.  He sees redness when he closes his left eye.  His symptoms persisted.  He came to the ER for further evaluation.  In the ER, vital signs are stable.  INR 1.7 on Coumadin .  Noncontrast head CT was negative for any acute intracranial abnormality.  Due to concern for CVA, EDP discussed the case with teleneurology, Dr. Doretta Gant, requested admission to [facility] for further workup.  The patient was accepted by Dr. Lydia Sams, [facility], and transferred to [facility] telemetry cardiac unit as inpatient status.  Seen by neurology/stroke team at [facility].  Recommended discussion with ophthalmology.  ED Course: Temperature 98.1.  BP 132/77, pulse 90, respiration rate 20, O2 saturation 97% on room air.  Review of Systems: Review of systems as noted in the HPI. All other systems reviewed and are negative.   Past Medical History:  Diagnosis Date   Acute lower GI bleeding 06/21/2018   Acute respiratory failure with hypoxia (HCC) 01/13/2015   Anaphylactic shock, unspecified, subsequent encounter 01/31/2020   Arthritis    Atrial flutter (HCC)    Bile leak, postoperative  03/15/2016   Biliary dyskinesia 02/12/2016   Blind right eye    Hx: of partial blindness in right eye   Cardiomyopathy    CHF (congestive heart failure) (HCC)    Coronary artery disease    normal coronaries by 10/10/08 cath, Cardiac Cath 08-04-12 epic.Dr. Sharyn Deforest follows   Diabetes mellitus    pt. states he's borderline diabetic., no longer taking med,- off med. since 2013   Dialysis patient [facility])    Mon-Wed-Fri([facility])- Left AV fistula   Diverticulitis 03/2015   reoccurred in December 2016   Diverticulitis of intestine without perforation or abscess without bleeding 04/21/2015   Dizziness 11/22/2018   DVT (deep venous thrombosis) (HCC)    ESRD (end stage renal disease) (HCC)    GERD (gastroesophageal reflux disease)    Gout    Hemorrhage of left kidney 01/13/2018   History of nephrectomy 07/04/2012   History of right nephrectomy in 2002 for renal cell carcinoma    History of unilateral nephrectomy    Hypertension    Hypotension due to hypovolemia 06/04/2020   Low iron     Myocardial infarction [facility]) ?2006   Renal cell carcinoma    dialysis- MWF- [address]- Dr. Harry Lindau follows.   Renal insufficiency    Shortness of breath 05/19/2011   "at rest, lying down, w/exertion"   Stroke [facility]) 02/2011   05/19/11 denies residual   Umbilical hernia 05/19/2011   unrepaired   Wears glasses    Past Surgical History:  Procedure Laterality Date   A/V FISTULAGRAM N/A 06/30/2023   Procedure: A/V Fistulagram;  Surgeon: Patrick Boor,  MD;  Location: [facility] INVASIVE CV LAB;  Service: Cardiovascular;  Laterality: N/A;   ANAL FISTULOTOMY  08/15/2018   AV FISTULA PLACEMENT  03/29/2011   Procedure: ARTERIOVENOUS (AV) FISTULA CREATION;  Surgeon: Arleta Lade, MD;  Location: [facility] OR;  Service: Vascular;  Laterality: Left;  LEFT RADIOCEPHALIC , Arteriovenous [phone])   CARDIAC CATHETERIZATION  05/21/11   CARDIAC CATHETERIZATION N/A 03/16/2016   Procedure: Left Heart Cath and  Coronary Angiography;  Surgeon: Pasqual Bone, MD;  Location: [facility] INVASIVE CV LAB;  Service: Cardiovascular;  Laterality: N/A;   CHOLECYSTECTOMY N/A 02/12/2016   Procedure: LAPAROSCOPIC CHOLECYSTECTOMY WITH INTRAOPERATIVE CHOLANGIOGRAM;  Surgeon: Adalberto Hollow, MD;  Location: [facility] OR;  Service: General;  Laterality: N/A;   COLONOSCOPY WITH PROPOFOL  N/A 02/01/2017   Procedure: COLONOSCOPY WITH PROPOFOL ;  Surgeon: Tami Falcon, MD;  Location: [facility] ENDOSCOPY;  Service: Endoscopy;  Laterality: N/A;   dialysis cath placed     ESOPHAGOGASTRODUODENOSCOPY (EGD) WITH PROPOFOL  N/A 12/02/2015   Procedure: ESOPHAGOGASTRODUODENOSCOPY (EGD) WITH PROPOFOL ;  Surgeon: Tami Falcon, MD;  Location: [facility] ENDOSCOPY;  Service: Endoscopy;  Laterality: N/A;   EVALUATION UNDER ANESTHESIA WITH HEMORRHOIDECTOMY N/A 08/15/2018   Procedure: HEMORRHOID LIGATION/PEXY;  Surgeon: Candyce Champagne, MD;  Location: [facility] OR;  Service: General;  Laterality: N/A;   FINGER SURGERY     L pinkie finger- ORIF- /w remaining hardware - 1990's     FISTULOTOMY N/A 08/15/2018   Procedure: SUPERFICIAL ANAL FISTULOTOMY;  Surgeon: Candyce Champagne, MD;  Location: [facility] OR;  Service: General;  Laterality: N/A;   HEMORRHOID SURGERY     HERNIA REPAIR N/A 07/04/2012   Umbilical hernia repair   INSERTION OF DIALYSIS CATHETER N/A 01/27/2016   Procedure: INSERTION OF DIALYSIS CATHETER;  Surgeon: Adine Hoof, MD;  Location: [facility] OR;  Service: Vascular;  Laterality: N/A;   INSERTION OF MESH N/A 07/04/2012   Procedure: INSERTION OF MESH;  Surgeon: Evander Hills, DO;  Location: [facility] OR;  Service: General;  Laterality: N/A;   IR EMBO ART  VEN HEMORR LYMPH EXTRAV  INC GUIDE ROADMAPPING  01/13/2018   IR FLUORO GUIDE CV LINE RIGHT  01/13/2018   IR IVC FILTER PLMT / S&I /IMG GUID/MOD SED  01/27/2018   IR RENAL SELECTIVE  UNI INC S&I MOD SED  01/13/2018   IR US  GUIDE VASC ACCESS RIGHT  01/13/2018   KIDNEY CYST REMOVAL  2019   LAPAROSCOPIC LYSIS OF ADHESIONS N/A 02/12/2016    Procedure: LAPAROSCOPIC LYSIS OF ADHESIONS;  Surgeon: Adalberto Hollow, MD;  Location: [facility] OR;  Service: General;  Laterality: N/A;   LEFT AND RIGHT HEART CATHETERIZATION WITH CORONARY ANGIOGRAM N/A 08/04/2012   Procedure: LEFT AND RIGHT HEART CATHETERIZATION WITH CORONARY ANGIOGRAM;  Surgeon: Darrold Emms, MD;  Location: [facility] CATH LAB;  Service: Cardiovascular;  Laterality: N/A;   NEPHRECTOMY  2000   right   PERIPHERAL VASCULAR BALLOON ANGIOPLASTY Left 06/30/2023   Procedure: PERIPHERAL VASCULAR BALLOON ANGIOPLASTY;  Surgeon: Patrick Boor, MD;  [facility] INV[facility] CV LAB;  Service: Cardiovascular;  Laterality: Left;  60% cephalic vein   REVERSE SHOULDER ARTHROPLASTY Right 09/20/2022   Procedure: REVERSE SHOULDER ARTHROPLASTY;  Surgeon: Janeth Medicus, MD;  [facility] OR;  Service: Orthopedics;  Laterality: Right;  120   REVERSE SHOULDER ARTHROPLASTY Left 03/18/2023   Procedure: REVERSE SHOULDER ARTHROPLASTY;  Surgeon: Janeth Medicus, MD;  Location: [facility] OR;  Service: Orthopedics;  Laterality: Left;  120   REVISON OF ARTERIOVENOUS FISTULA Left 06/05/2013   Procedure: REVISON OF LEFT RADIOCEPHALIC  ARTERIOVENOUS FISTULA;  Surgeon: Arvil Lauber, MD;  Location: [facility] OR;  Service: Vascular;  Laterality: Left;   REVISON OF ARTERIOVENOUS FISTULA Left 01/27/2016   Procedure: REVISION OF LEFT UPPER EXTREMITY ARTERIOVENOUS FISTULA;  Surgeon: Adine Hoof, MD;  Location: [facility] OR;  Service: Vascular;  Laterality: Left;   REVISON OF ARTERIOVENOUS FISTULA Left 08/03/2016   Procedure: REVISON OF Left arm ARTERIOVENOUS FISTULA;  Surgeon: Richrd Char, MD;  Location: [facility] OR;  Service: Vascular;  Laterality: Left;   REVISON OF ARTERIOVENOUS FISTULA Left 05/06/2017   Procedure: REVISION PLICATION OF ARTERIOVENOUS FISTULA LEFT ARM;  Surgeon: Mayo Speck, MD;  [facility] OR;  Service: Vascular;  Laterality: Left;   REVISON OF ARTERIOVENOUS FISTULA Left 02/01/2019   Procedure: REVISION OF  ARTERIOVENOUS FISTULA LEFT ARM;  Surgeon: Young Hensen, MD;  Location: [facility] OR;  Service: Vascular;  Laterality: Left;   REVISON OF ARTERIOVENOUS FISTULA Left 03/15/2019   Procedure: REVISION PLICATION OF ARTERIOVENOUS FISTULA LEFT ARM;  Surgeon: Young Hensen, MD;  [facility] OR;  Service: Vascular;  Laterality: Left;   RIGHT HEART CATHETERIZATION N/A 05/21/2011   Procedure: RIGHT HEART CATH;  Surgeon: Darrold Emms, MD;  Location: [facility] CATH LAB;  Service: Cardiovascular;  Laterality: N/A;   smashed  1990's   "left pinky; have a plate in there"   UMBILICAL HERNIA REPAIR N/A 07/04/2012   Procedure: LAPAROSCOPIC UMBILICAL HERNIA;  Surgeon: Evander Hills, DO;  Location: [facility] OR;  Service: General;  Laterality: N/A;   US  ECHOCARDIOGRAPHY  05/20/11    Social History:  reports that he has never smoked. He has never used smokeless tobacco. He reports current drug use. Drug: Hydrocodone . He reports that he does not drink alcohol.   Allergies  Allergen Reactions   Ace Inhibitors Itching, Cough and Other (See Comments)    Family History  Problem Relation Age of Onset   Hypertension Mother    Kidney disease Mother       Prior to Admission medications   Medication Sig Start Date End Date Taking? Authorizing Provider  acetaminophen  (TYLENOL ) 500 MG tablet Take 500 mg by mouth as needed.   Yes [provider]  albuterol  (VENTOLIN  HFA) 108 (90 Base) MCG/ACT inhaler Inhale 2 puffs into the lungs every 6 (six) hours as needed for wheezing or shortness of breath. 09/18/19  Yes Susanna Epley, FNP  calcium  acetate (PHOSLO ) 667 MG capsule Take 1-3 capsules (667-2,001 mg total) by mouth See admin instructions. Take 2001 mg capsules with meals three times daily and 667 mg capsule with snacks. 11/23/18  Yes Pasqual Bone, MD  digoxin  (LANOXIN ) 0.125 MG tablet Take 1 tablet (0.125 mg total) by mouth every Monday, Wednesday, and Friday. 06/09/20  Yes Rai, Ripudeep K, MD  gabapentin  (NEURONTIN )  300 MG capsule Take 1 capsule (300 mg total) by mouth at bedtime. 07/05/23  Yes Susanna Epley, FNP  loperamide  (IMODIUM  A-D) 2 MG tablet Take 1 tablet (2 mg total) by mouth 4 (four) times daily as needed for diarrhea or loose stools. Also available OTC. 06/09/20  Yes Rai, Ripudeep K, MD  midodrine  (PROAMATINE ) 10 MG tablet Take 1 tablet (10 mg total) by mouth 3 (three) times daily. 06/09/20  Yes Rai, Ripudeep K, MD  pantoprazole  (PROTONIX ) 40 MG tablet Take 40 mg by mouth 2 (two) times daily.   Yes [provider]  warfarin (COUMADIN ) 7.5 MG tablet Take 7.5 mg by mouth daily.   Yes [provider]  Control Gel Formula  Dressing (DUODERM CGF EXTRA THIN) MISC Apply 1 each topically daily as needed. Patient not taking: Reported on 10/11/2023 10/26/20   Susanna Epley, FNP  glucose blood test strip Use to check blood sugar 3 times a day. Dx code e11.65 10/13/21   Moore, Janece, FNP  HYDROcodone -acetaminophen  (NORCO) 7.5-325 MG tablet Take 1 tablet by mouth every 6 (six) hours as needed for moderate pain. Patient not taking: Reported on 10/11/2023 09/21/22   McClung, Kevan D, PA  Lactobacillus (ACIDOPHILUS/BIFIDUS) WAFR Take 1 Wafer by mouth daily. Patient not taking: Reported on 10/11/2023 07/01/22   Susanna Epley, FNP  Lancets [facility] DELICA PLUS [place]) MISC Use to check blood sugar 3 times a day. Dx code e11.65 10/13/21   Moore, Janece, FNP  lidocaine  (LIDODERM ) 5 % Place 1 patch onto the skin daily. Remove & Discard patch within 12 hours or as directed by MD Patient not taking: Reported on 10/11/2023 09/11/22   Denese Finn, PA-C  meclizine  (ANTIVERT ) 25 MG tablet Take 1 tablet (25 mg total) by mouth 3 (three) times daily as needed for dizziness. Patient not taking: Reported on 10/11/2023 04/08/21   Horton, Sidra Dredge, DO  metoprolol  succinate (TOPROL -XL) 25 MG 24 hr tablet Take 1 tablet (25 mg total) by mouth daily. Patient not taking: Reported on 10/11/2023 06/09/20   Rai, Hurman Maiden, MD   ondansetron  (ZOFRAN  ODT) 4 MG disintegrating tablet Take 1 tablet (4 mg total) by mouth every 8 (eight) hours as needed for nausea or vomiting. Patient not taking: Reported on 10/11/2023 06/09/20   Rai, Hurman Maiden, MD  ondansetron  (ZOFRAN ) 4 MG tablet Take 1 tablet (4 mg total) by mouth every 6 (six) hours. Patient not taking: Reported on 10/11/2023 07/12/23   Burnette Carte, MD  oxyCODONE  (ROXICODONE ) 5 MG immediate release tablet Take 1 tablet (5 mg total) by mouth every 6 (six) hours as needed for moderate pain (pain score 4-6) or severe pain (pain score 7-10). Patient not taking: Reported on 10/11/2023 03/19/23 03/18/24  Janeth Medicus, MD  UNABLE TO FIND Out patient physical therapy- vestibular therapy for BPPV 08/01/18   Sedalia Dacosta, MD    Physical Exam: BP 132/77 (BP Location: Right Wrist)   Pulse 89   Temp 98.1 F (36.7 C) (Oral)   Resp 20   Ht 6\' 1"  (1.854 m)   Wt 105.6 kg   SpO2 97%   BMI 30.70 kg/m   General: 63 y.o. year-old male well developed well nourished in no acute distress.  Alert and oriented x3. Cardiovascular: Regular rate and rhythm with no rubs or gallops.  No thyromegaly or JVD noted.  No lower extremity edema. 2/4 pulses in all 4 extremities. Respiratory: Clear to auscultation with no wheezes or rales. Good inspiratory effort. Abdomen: Soft nontender nondistended with normal bowel sounds x4 quadrants. Muskuloskeletal: No cyanosis, clubbing or edema noted bilaterally Neuro: CN II-XII intact, strength, sensation, reflexes Skin: No ulcerative lesions noted or rashes Psychiatry: Judgement and insight appear normal. Mood is appropriate for condition and setting          Labs on Admission:  Basic Metabolic Panel: Recent Labs  Lab 10/11/23 1225  NA 136  K 5.1  CL 94*  CO2 28  GLUCOSE 94  BUN 25*  CREATININE 7.66*  CALCIUM  9.7  MG 2.3  PHOS 5.3*   Liver Function Tests: Recent Labs  Lab 10/11/23 1225  AST 12*  ALT 6  ALKPHOS 116  BILITOT  0.6  PROT 8.2*  ALBUMIN   3.9   No results for input(s): "LIPASE", "AMYLASE" in the last 168 hours. No results for input(s): "AMMONIA" in the last 168 hours. CBC: Recent Labs  Lab 10/11/23 1225  WBC 4.9  HGB 11.6*  HCT 36.9*  MCV 93.9  PLT 218   Cardiac Enzymes: No results for input(s): "CKTOTAL", "CKMB", "CKMBINDEX", "TROPONINI" in the last 168 hours.  BNP (last 3 results) No results for input(s): "BNP" in the last 8760 hours.  ProBNP (last 3 results) No results for input(s): "PROBNP" in the last 8760 hours.  CBG: Recent Labs  Lab 10/11/23 1244 10/11/23 2111  GLUCAP 81 86    Radiological Exams on Admission: MR BRAIN WO CONTRAST Result Date: 10/12/2023 CLINICAL DATA:  Neuro deficit with stroke suspected. Chronic cervical myelopathy. EXAM: MRI HEAD WITHOUT CONTRAST MRI CERVICAL SPINE WITHOUT CONTRAST TECHNIQUE: Multiplanar, multiecho pulse sequences of the brain and surrounding structures, and cervical spine, to include the craniocervical junction and cervicothoracic junction, were obtained without intravenous contrast. COMPARISON:  Brain MRI 04/08/2021 FINDINGS: MRI HEAD FINDINGS Brain: No acute infarction, hemorrhage, hydrocephalus, extra-axial collection or mass lesion. Small FLAIR hyperintensities scattered in the cerebral white matter attributed to chronic small vessel ischemia with lacunes. Chronic lacunar infarcts in the bilateral pons. Mild for age cerebral volume loss which is generalized. Vascular: Normal flow voids. Skull and upper cervical spine: Normal marrow signal Sinuses/Orbits: Unremarkable MRI CERVICAL SPINE FINDINGS Alignment: Reversal of cervical lordosis. Vertebrae: No fracture, evidence of discitis, or bone lesion. Fatty degenerative marrow conversion at multiple endplates. Cord: Normal signal and morphology when allowing for motion artifact Posterior Fossa, vertebral arteries, paraspinal tissues: No perispinal mass or inflammation Disc levels: C2-3: Degenerative  facet spurring and mild disc narrowing. No neural impingement C3-4: Disc collapse with endplate and uncovertebral ridging. Degenerative facet spurring which is moderate on the left. At least moderate left foraminal narrowing C4-5: Disc collapse with endplate and uncovertebral ridging. Estimate moderate bilateral foraminal narrowing. Patent spinal canal. C5-6: Disc narrowing and endplate degeneration with uncovertebral and facet spurring. Estimate mild spinal stenosis and mild bilateral foraminal narrowing. C6-7: Disc narrowing and bulging with endplate and uncovertebral ridging. There is facet spurring and ligamentum flavum thickening. Estimate moderate spinal stenosis with midline canal diameter of 7 mm. Advanced right foraminal stenosis. C7-T1:Degenerative facet spurring to a moderate degree. Mild disc bulging. IMPRESSION: Brain MRI: No acute finding. 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 Cervical spine: Moderate motion artifact. Generalized degeneration with up to moderate spinal stenosis at C6-7. Multilevel foraminal narrowing which appears focally advanced at C6-7 on the right. Electronically Signed   By: Ronnette Coke M.D.   On: 10/12/2023 05:10   MR CERVICAL SPINE WO CONTRAST Result Date: 10/12/2023 CLINICAL DATA:  Neuro deficit with stroke suspected. Chronic cervical myelopathy. EXAM: MRI HEAD WITHOUT CONTRAST MRI CERVICAL SPINE WITHOUT CONTRAST TECHNIQUE: Multiplanar, multiecho pulse sequences of the brain and surrounding structures, and cervical spine, to include the craniocervical junction and cervicothoracic junction, were obtained without intravenous contrast. COMPARISON:  Brain MRI 04/08/2021 FINDINGS: MRI HEAD FINDINGS Brain: No acute infarction, hemorrhage, hydrocephalus, extra-axial collection or mass lesion. Small FLAIR hyperintensities scattered in the cerebral white matter attributed to chronic small vessel ischemia with lacunes. Chronic lacunar infarcts in the  bilateral pons. Mild for age cerebral volume loss which is generalized. Vascular: Normal flow voids. Skull and upper cervical spine: Normal marrow signal Sinuses/Orbits: Unremarkable MRI CERVICAL SPINE FINDINGS Alignment: Reversal of cervical lordosis. Vertebrae: No fracture, evidence of discitis, or bone lesion. Fatty degenerative marrow  conversion at multiple endplates. Cord: Normal signal and morphology when allowing for motion artifact Posterior Fossa, vertebral arteries, paraspinal tissues: No perispinal mass or inflammation Disc levels: C2-3: Degenerative facet spurring and mild disc narrowing. No neural impingement C3-4: Disc collapse with endplate and uncovertebral ridging. Degenerative facet spurring which is moderate on the left. At least moderate left foraminal narrowing C4-5: Disc collapse with endplate and uncovertebral ridging. Estimate moderate bilateral foraminal narrowing. Patent spinal canal. C5-6: Disc narrowing and endplate degeneration with uncovertebral and facet spurring. Estimate mild spinal stenosis and mild bilateral foraminal narrowing. C6-7: Disc narrowing and bulging with endplate and uncovertebral ridging. There is facet spurring and ligamentum flavum thickening. Estimate moderate spinal stenosis with midline canal diameter of 7 mm. Advanced right foraminal stenosis. C7-T1:Degenerative facet spurring to a moderate degree. Mild disc bulging. IMPRESSION: Brain MRI: No acute finding. 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 Cervical spine: Moderate motion artifact. Generalized degeneration with up to moderate spinal stenosis at C6-7. Multilevel foraminal narrowing which appears focally advanced at C6-7 on the right. Electronically Signed   By: Ronnette Coke M.D.   On: 10/12/2023 05:10   CT Head Wo Contrast Result Date: 10/11/2023 CLINICAL DATA:  Provided history: Vision loss, monocular. Additional history provided: The patient reports double vision and weakness.  EXAM: CT HEAD WITHOUT CONTRAST TECHNIQUE: Contiguous axial images were obtained from the base of the skull through the vertex without intravenous contrast. RADIATION DOSE REDUCTION: This exam was performed according to the departmental dose-optimization program which includes automated exposure control, adjustment of the mA and/or kV according to patient size and/or use of iterative reconstruction technique. COMPARISON:  Brain MRI 04/08/2021. FINDINGS: Brain: Mild generalized cerebral atrophy. Unchanged chronic lacunar infarcts within the left lentiform nucleus/retrolenticular white matter and within the pons. Multifocal T2 FLAIR hyperintense signal abnormality elsewhere within the cerebral white matter, nonspecific but compatible with mild chronic small vessel ischemic disease. There is no acute intracranial hemorrhage. No demarcated cortical infarct. No extra-axial fluid collection. No evidence of an intracranial mass. No midline shift. Vascular: No hyperdense vessel.  Atherosclerotic calcifications. Skull: No calvarial fracture or aggressive osseous lesion. Sinuses/Orbits: No mass or acute finding within the imaged orbits. No significant paranasal sinus disease. IMPRESSION: 1. No acute intracranial finding. 2. Unchanged chronic lacunar infarcts within the left lentiform nucleus and adjacent white matter, and within the pons. 3. Background mild cerebral white matter chronic small vessel ischemic disease. 4. Mild generalized cerebral atrophy. Electronically Signed   By: Bascom Lily D.O.   On: 10/11/2023 14:29    EKG: I independently viewed the EKG done and my findings are as followed: Normal sinus rhythm rate of 88.  Nonspecific ST changes.  QTc 505.  Assessment/Plan Present on Admission: **None**  Principal Problem:   Vision disturbance  Left vision changes, POA Seen by neurology/stroke team Recommendation for ophthalmology evaluation Called ophthalmology Dr. Juanito Norma' s office and left a voicemail  message and also added Dr. McCuen to the treatment team. Home Coumadin  was held overnight until retinal hemorrhage is ruled out  Paroxysmal A-flutter on Coumadin  Pharmacy was consulted for Coumadin  dosage Rate is currently controlled INR 1.7 Monitor INR Home Coumadin  dose was held overnight as stated above  ESRD on HD MWF Neurology consulted to resume hemodialysis sessions Volume status and electrolytes managed with hemodialysis  Polyneuropathy Resume home gabapentin   Obesity BMI 30 Recommend weight loss outpatient   Time: 75 minutes.   DVT prophylaxis: SCDs.  Code Status: Full code.  Family Communication: None  at bedside.  Disposition Plan: Admitted to telemetry cardiac unit.  Consults called: Neurology, ophthalmology.  Admission status: Inpatient status.   Status is: Inpatient The patient requires at least 2 midnights for further evaluation and treatment of present condition.   Bary Boss MD [facility] Pager [phone]  If 7PM-7AM, please contact night-coverage [email or web address] Password [email or web address]  10/12/2023, 5:15 AM

## 2023-10-11 NOTE — ED Provider Notes (Signed)
 [place] EMERGENCY DEPARTMENT AT [facility] Provider Note   CSN: [id number] Arrival date & time: 10/11/23  1108     History  Chief Complaint  Patient presents with   Visual Field Change    [name] is a 63 y.o. male.  [name] is 37 y.o. presenting new vision changes to the left eye.   Patient has pertinent PMHx of CAD, heart failure with reduced ejection fraction, ESRD on dialysis Monday Wednesday Friday, atrial fibrillation, type 2 diabetes.  Patient presenting today with new left vision changes.  Reports that he has a black spot in his left eye.  That is primarily in the upper part of the eye.  This began on Sunday suddenly.  He called his doctor today and was told to come into the ED.  He has had no associated slurring, difficulty speaking, new numbness or focal weakness.  His eye is not painful he has had no fevers.  He has had increased fatigue and sleepiness over the past 48 hours as well as generalized weakness worse than normal.  He did go to dialysis yesterday and on Friday.        Home Medications Prior to Admission medications   Medication Sig Start Date End Date Taking? Authorizing Provider  albuterol  (VENTOLIN  HFA) 108 (90 Base) MCG/ACT inhaler Inhale 2 puffs into the lungs every 6 (six) hours as needed for wheezing or shortness of breath. 09/18/19   Susanna Epley, FNP  calcium  acetate (PHOSLO ) 667 MG capsule Take 1-3 capsules (667-2,001 mg total) by mouth See admin instructions. Take 2001 mg capsules with meals three times daily and 667 mg capsule with snacks. 11/23/18   Pasqual Bone, MD  chlorhexidine  (PERIDEX ) 0.12 % solution 15 mLs by Mouth Rinse route 2 (two) times daily. 05/29/18   [provider]  Control Gel Formula Dressing (DUODERM CGF EXTRA THIN) MISC Apply 1 each topically daily as needed. 10/26/20   Susanna Epley, FNP  digoxin  (LANOXIN ) 0.125 MG tablet Take 1 tablet (0.125 mg total) by mouth every Monday, Wednesday, and Friday.  06/09/20   Rai, Hurman Maiden, MD  gabapentin  (NEURONTIN ) 300 MG capsule Take 1 capsule (300 mg total) by mouth at bedtime. 07/05/23   Susanna Epley, FNP  glucose blood test strip Use to check blood sugar 3 times a day. Dx code e11.65 10/13/21   Moore, Janece, FNP  HYDROcodone -acetaminophen  (NORCO) 10-325 MG tablet Take 0.5 tablets by mouth 2 (two) times daily as needed for moderate pain. 06/17/22   [provider]  HYDROcodone -acetaminophen  (NORCO) 7.5-325 MG tablet Take 1 tablet by mouth every 6 (six) hours as needed for moderate pain. 09/21/22   McClung, Kevan D, PA  isosorbide  dinitrate (ISORDIL ) 10 MG tablet Take 10 mg by mouth 2 (two) times daily. 07/07/21   [provider]  Lactobacillus (ACIDOPHILUS/BIFIDUS) WAFR Take 1 Wafer by mouth daily. 07/01/22   Susanna Epley, FNP  Lancets [facility] DELICA PLUS [place]) MISC Use to check blood sugar 3 times a day. Dx code e11.65 10/13/21   Moore, Janece, FNP  lidocaine  (LIDODERM ) 5 % Place 1 patch onto the skin daily. Remove & Discard patch within 12 hours or as directed by MD 09/11/22   Denese Finn, PA-C  loperamide  (IMODIUM  A-D) 2 MG tablet Take 1 tablet (2 mg total) by mouth 4 (four) times daily as needed for diarrhea or loose stools. Also available OTC. 06/09/20   Rai, Hurman Maiden, MD  meclizine  (ANTIVERT ) 25 MG tablet Take 1  tablet (25 mg total) by mouth 3 (three) times daily as needed for dizziness. 04/08/21   Horton, Kristie M, DO  metoprolol  succinate (TOPROL -XL) 25 MG 24 hr tablet Take 1 tablet (25 mg total) by mouth daily. 06/09/20   Rai, Hurman Maiden, MD  midodrine  (PROAMATINE ) 10 MG tablet Take 1 tablet (10 mg total) by mouth 3 (three) times daily. 06/09/20   Rai, Ripudeep K, MD  multivitamin (RENA-VIT) TABS tablet Take 1 tablet by mouth at bedtime.    [provider]  nitroGLYCERIN  (NITROSTAT ) 0.4 MG SL tablet Place 0.4 mg under the tongue every 5 (five) minutes as needed for chest pain.  05/20/15   [provider]   Nutritional Supplements (FEEDING SUPPLEMENT, NEPRO CARB STEADY,) LIQD Take 237 mLs by mouth daily. 03/22/16   [provider]  ondansetron  (ZOFRAN  ODT) 4 MG disintegrating tablet Take 1 tablet (4 mg total) by mouth every 8 (eight) hours as needed for nausea or vomiting. 06/09/20   Rai, Hurman Maiden, MD  ondansetron  (ZOFRAN ) 4 MG tablet Take 1 tablet (4 mg total) by mouth every 6 (six) hours. 07/12/23   Burnette Carte, MD  oxyCODONE  (ROXICODONE ) 5 MG immediate release tablet Take 1 tablet (5 mg total) by mouth every 6 (six) hours as needed for moderate pain (pain score 4-6) or severe pain (pain score 7-10). 03/19/23 03/18/24  Janeth Medicus, MD  pantoprazole  (PROTONIX ) 40 MG tablet Take 40 mg by mouth 2 (two) times daily.    [provider]  sucroferric oxyhydroxide (VELPHORO ) 500 MG chewable tablet Chew 500-1,000 mg by mouth See admin instructions. Takes 2 tablets with each meal and 1 tablet with snacks. 10/02/18   [provider]  UNABLE TO FIND Out patient physical therapy- vestibular therapy for BPPV 08/01/18   Rizwan, Saima, MD  warfarin (COUMADIN ) 7.5 MG tablet Take 7.5 mg by mouth daily.    [provider]      Allergies    Ace inhibitors    Review of Systems   Review of Systems  Physical Exam Updated Vital Signs BP 99/66 (BP Location: Left Arm)   Pulse 80   Temp 98.4 F (36.9 C) (Oral)   Resp (!) 23   SpO2 98%  Physical Exam Vitals and nursing note reviewed.  Constitutional:      General: He is not in acute distress.    Appearance: He is obese. He is ill-appearing.  HENT:     Head: Normocephalic and atraumatic.     Nose: Nose normal. No congestion.     Mouth/Throat:     Mouth: Mucous membranes are moist.  Eyes:     General: Visual field deficit present.     Extraocular Movements: Extraocular movements intact.     Pupils: Pupils are equal, round, and reactive to light.     Comments: Left upper quadrant visual field deficit.  Otherwise normal.  No conjunctivitis. Arcus senilis present bilaterally.  Cardiovascular:     Rate and Rhythm: Normal rate and regular rhythm.     Pulses: Normal pulses.     Heart sounds: Normal heart sounds.  Pulmonary:     Effort: Pulmonary effort is normal.     Breath sounds: Normal breath sounds.  Abdominal:     General: There is no distension.     Palpations: Abdomen is soft.     Tenderness: There is no abdominal tenderness.  Musculoskeletal:     Right lower leg: No edema.     Left lower leg:  No edema.     Comments: Palpable thrill left arm fistula  Skin:    General: Skin is warm and dry.  Neurological:     General: No focal deficit present.     Mental Status: He is alert and oriented to person, place, and time.     GCS: GCS eye subscore is 4. GCS verbal subscore is 5. GCS motor subscore is 6.     Sensory: Sensation is intact.     Motor: No tremor or abnormal muscle tone.     Coordination: Finger-Nose-Finger Test abnormal.     Comments: Strength 5/5 upper extremities. Strength 5/5 left lower extremity. Strength 4/5 all muscle groups right lower extremity (chronic).  Psychiatric:        Mood and Affect: Mood normal.     ED Results / Procedures / Treatments   Labs (all labs ordered are listed, but only abnormal results are displayed) Labs Reviewed  COMPREHENSIVE METABOLIC PANEL WITH GFR - Abnormal; Notable for the following components:      Result Value   Chloride 94 (*)    BUN 25 (*)    Creatinine, Ser 7.66 (*)    Total Protein 8.2 (*)    AST 12 (*)    GFR, Estimated 7 (*)    All other components within normal limits  PHOSPHORUS - Abnormal; Notable for the following components:   Phosphorus 5.3 (*)    All other components within normal limits  CBC - Abnormal; Notable for the following components:   RBC 3.93 (*)    Hemoglobin 11.6 (*)    HCT 36.9 (*)    RDW 17.2 (*)    All other components within normal limits  PROTIME-INR - Abnormal; Notable for the  following components:   Prothrombin  Time 20.4 (*)    INR 1.7 (*)    All other components within normal limits  MAGNESIUM  DIGOXIN  LEVEL  CBG MONITORING, ED    EKG EKG Interpretation Date/Time:  Tuesday Oct 11 2023 13:12:22 EDT Ventricular Rate:  77 PR Interval:  178 QRS Duration:  122 QT Interval:  435 QTC Calculation: 493 R Axis:   -32  Text Interpretation: Sinus rhythm LVH with secondary repolarization abnormality Borderline prolonged QT interval No significant change since last tracing Confirmed by Sueellen Emery [phone]) on 10/11/2023 1:31:44 PM  Radiology CT Head Wo Contrast Result Date: 10/11/2023 CLINICAL DATA:  Provided history: Vision loss, monocular. Additional history provided: The patient reports double vision and weakness. EXAM: CT HEAD WITHOUT CONTRAST TECHNIQUE: Contiguous axial images were obtained from the base of the skull through the vertex without intravenous contrast. RADIATION DOSE REDUCTION: This exam was performed according to the departmental dose-optimization program which includes automated exposure control, adjustment of the mA and/or kV according to patient size and/or use of iterative reconstruction technique. COMPARISON:  Brain MRI 04/08/2021. FINDINGS: Brain: Mild generalized cerebral atrophy. Unchanged chronic lacunar infarcts within the left lentiform nucleus/retrolenticular white matter and within the pons. Multifocal T2 FLAIR hyperintense signal abnormality elsewhere within the cerebral white matter, nonspecific but compatible with mild chronic small vessel ischemic disease. There is no acute intracranial hemorrhage. No demarcated cortical infarct. No extra-axial fluid collection. No evidence of an intracranial mass. No midline shift. Vascular: No hyperdense vessel.  Atherosclerotic calcifications. Skull: No calvarial fracture or aggressive osseous lesion. Sinuses/Orbits: No mass or acute finding within the imaged orbits. No significant paranasal sinus  disease. IMPRESSION: 1. No acute intracranial finding. 2. Unchanged chronic lacunar infarcts within the left lentiform nucleus  and adjacent white matter, and within the pons. 3. Background mild cerebral white matter chronic small vessel ischemic disease. 4. Mild generalized cerebral atrophy. Electronically Signed   By: Bascom Lily D.O.   On: 10/11/2023 14:29    Procedures Procedures    Medications Ordered in ED Medications - No data to display  ED Course/ Medical Decision Making/ A&P Clinical Course as of 10/11/23 1527  Tue Oct 11, 2023  1252 Protime-INR(!) Mildly subtherapeutic. [MQ]  1323 Creatinine stable at baseline ~7 per chart review. Hgb stable.  [MQ]  1437 CT Head Wo Contrast No acute stroke, evidence of prior stroke and generalized atrophy. Given high clinical  [MQ]    Clinical Course User Index [MQ] Ivin Marrow, MD                                 Medical Decision Making Clinical concern for acute small vessel posterior stroke causing vision changes, unfortunately patient is outside of window for TNK or mechanical thrombolysis. Patient is at risk for hemorrhagic stroke as he is on warfarin. Will pursue stroke work-up CT Head followed by likely MRI Brain and CTA Head/Neck. Low concern for acute eye pathology (acute angle glaucoma, orbital/preseptal cellulitis, trauma to the eye) Reassuringly, patient is hemodynamically stable outside of known mild hypotension for which he takes midodrine  with dialysis. I suspect that patient's generalized weakness is secondary to possible stroke; however, he has many co morbidities which may be contributing.  Will evaluate with CMP, mag, Phos, CBC.  Gross work-up thus far negative. Still suspect stroke. Given no findings on CT, will consult neurology for recommendations for MRI Brain and/or admittance for stroke work-up.  Signed out patient to oncoming ED provider Dr. Colonel Dears who resume care of patient.   Amount and/or Complexity of Data  Reviewed Labs: ordered. Decision-making details documented in ED Course. Radiology: ordered. Decision-making details documented in ED Course.          Final Clinical Impression(s) / ED Diagnoses Final diagnoses:  Vision changes    Rx / DC Orders ED Discharge Orders     None         Ivin Marrow, MD 10/11/23 1528    Sueellen Emery, MD 10/11/23 1600

## 2023-10-11 NOTE — ED Notes (Signed)
 Report given to the Floor RN.Marland KitchenMarland Kitchen

## 2023-10-11 NOTE — ED Notes (Signed)
 Called [facility] to transport patient to [facility] rm# [id number]

## 2023-10-12 ENCOUNTER — Inpatient Hospital Stay (HOSPITAL_COMMUNITY)

## 2023-10-12 DIAGNOSIS — E861 Hypovolemia: Secondary | ICD-10-CM | POA: Diagnosis not present

## 2023-10-12 DIAGNOSIS — N186 End stage renal disease: Secondary | ICD-10-CM

## 2023-10-12 DIAGNOSIS — I6501 Occlusion and stenosis of right vertebral artery: Secondary | ICD-10-CM | POA: Diagnosis not present

## 2023-10-12 DIAGNOSIS — I6523 Occlusion and stenosis of bilateral carotid arteries: Secondary | ICD-10-CM | POA: Diagnosis not present

## 2023-10-12 DIAGNOSIS — I672 Cerebral atherosclerosis: Secondary | ICD-10-CM | POA: Diagnosis not present

## 2023-10-12 DIAGNOSIS — I509 Heart failure, unspecified: Secondary | ICD-10-CM

## 2023-10-12 DIAGNOSIS — I69391 Dysphagia following cerebral infarction: Secondary | ICD-10-CM | POA: Diagnosis not present

## 2023-10-12 DIAGNOSIS — E1151 Type 2 diabetes mellitus with diabetic peripheral angiopathy without gangrene: Secondary | ICD-10-CM

## 2023-10-12 DIAGNOSIS — Z992 Dependence on renal dialysis: Secondary | ICD-10-CM

## 2023-10-12 DIAGNOSIS — I4892 Unspecified atrial flutter: Secondary | ICD-10-CM | POA: Diagnosis not present

## 2023-10-12 DIAGNOSIS — H3412 Central retinal artery occlusion, left eye: Secondary | ICD-10-CM

## 2023-10-12 DIAGNOSIS — I429 Cardiomyopathy, unspecified: Secondary | ICD-10-CM

## 2023-10-12 DIAGNOSIS — Z7901 Long term (current) use of anticoagulants: Secondary | ICD-10-CM

## 2023-10-12 DIAGNOSIS — H538 Other visual disturbances: Secondary | ICD-10-CM | POA: Diagnosis not present

## 2023-10-12 DIAGNOSIS — I959 Hypotension, unspecified: Secondary | ICD-10-CM

## 2023-10-12 DIAGNOSIS — I2510 Atherosclerotic heart disease of native coronary artery without angina pectoris: Secondary | ICD-10-CM

## 2023-10-12 DIAGNOSIS — E785 Hyperlipidemia, unspecified: Secondary | ICD-10-CM

## 2023-10-12 DIAGNOSIS — G629 Polyneuropathy, unspecified: Secondary | ICD-10-CM

## 2023-10-12 LAB — CBC
HCT: 34.8 % — ABNORMAL LOW (ref 39.0–52.0)
Hemoglobin: 11.2 g/dL — ABNORMAL LOW (ref 13.0–17.0)
MCH: 29.6 pg (ref 26.0–34.0)
MCHC: 32.2 g/dL (ref 30.0–36.0)
MCV: 92.1 fL (ref 80.0–100.0)
Platelets: 233 10*3/uL (ref 150–400)
RBC: 3.78 MIL/uL — ABNORMAL LOW (ref 4.22–5.81)
RDW: 16.9 % — ABNORMAL HIGH (ref 11.5–15.5)
WBC: 5.3 10*3/uL (ref 4.0–10.5)
nRBC: 0 % (ref 0.0–0.2)

## 2023-10-12 LAB — RENAL FUNCTION PANEL
Albumin: 3.1 g/dL — ABNORMAL LOW (ref 3.5–5.0)
Anion gap: 11 (ref 5–15)
BUN: 30 mg/dL — ABNORMAL HIGH (ref 6–20)
CO2: 28 mmol/L (ref 22–32)
Calcium: 8.5 mg/dL — ABNORMAL LOW (ref 8.9–10.3)
Chloride: 96 mmol/L — ABNORMAL LOW (ref 98–111)
Creatinine, Ser: 9.14 mg/dL — ABNORMAL HIGH (ref 0.61–1.24)
GFR, Estimated: 6 mL/min — ABNORMAL LOW (ref >60)
Glucose, Bld: 95 mg/dL (ref 70–99)
Phosphorus: 7.2 mg/dL — ABNORMAL HIGH (ref 2.5–4.6)
Potassium: 6.2 mmol/L — ABNORMAL HIGH (ref 3.5–5.1)
Sodium: 135 mmol/L (ref 135–145)

## 2023-10-12 LAB — FOLATE: Folate: 5.8 ng/mL — ABNORMAL LOW (ref >5.9)

## 2023-10-12 LAB — HEMOGLOBIN A1C
Hgb A1c MFr Bld: 4.8 % (ref 4.8–5.6)
Mean Plasma Glucose: 91.06 mg/dL

## 2023-10-12 LAB — ECHOCARDIOGRAM COMPLETE
AR max vel: 2.41 cm2
AV Area VTI: 2.22 cm2
AV Area mean vel: 1.89 cm2
AV Mean grad: 7 mmHg
AV Peak grad: 15.2 mmHg
Ao pk vel: 1.95 m/s
Calc EF: 24.3 %
Height: 73 in
S' Lateral: 5.2 cm
Single Plane A2C EF: 20.3 %
Single Plane A4C EF: 28.8 %
Weight: 3723.2 [oz_av]

## 2023-10-12 LAB — LIPID PANEL
Cholesterol: 182 mg/dL (ref 0–200)
HDL: 50 mg/dL (ref >40)
LDL Cholesterol: 108 mg/dL — ABNORMAL HIGH (ref 0–99)
Total CHOL/HDL Ratio: 3.6 ratio
Triglycerides: 120 mg/dL (ref <150)
VLDL: 24 mg/dL (ref 0–40)

## 2023-10-12 LAB — GLUCOSE, CAPILLARY
Glucose-Capillary: 66 mg/dL — ABNORMAL LOW (ref 70–99)
Glucose-Capillary: 74 mg/dL (ref 70–99)
Glucose-Capillary: 63 mg/dL — ABNORMAL LOW (ref 70–99)
Glucose-Capillary: 82 mg/dL (ref 70–99)

## 2023-10-12 LAB — TSH: TSH: 1.692 u[IU]/mL (ref 0.350–4.500)

## 2023-10-12 LAB — PROTIME-INR
INR: 1.6 — ABNORMAL HIGH (ref 0.8–1.2)
Prothrombin Time: 19.4 s — ABNORMAL HIGH (ref 11.4–15.2)

## 2023-10-12 LAB — HIV ANTIBODY (ROUTINE TESTING W REFLEX): HIV Screen 4th Generation wRfx: NONREACTIVE

## 2023-10-12 LAB — VITAMIN B12: Vitamin B-12: 407 pg/mL (ref 180–914)

## 2023-10-12 MED ORDER — ATORVASTATIN CALCIUM 40 MG PO TABS
40.0000 mg | ORAL_TABLET | Freq: Every day | ORAL | Status: DC
  Administered 2023-10-12 – 2023-10-13 (×2): 40 mg via ORAL
  Filled 2023-10-12 (×2): qty 1

## 2023-10-12 MED ORDER — GABAPENTIN 300 MG PO CAPS
300.0000 mg | ORAL_CAPSULE | Freq: Every evening | ORAL | Status: DC
  Administered 2023-10-12: 300 mg via ORAL
  Filled 2023-10-12: qty 1

## 2023-10-12 MED ORDER — GABAPENTIN 300 MG PO CAPS: 300.0000 mg | ORAL_CAPSULE | Freq: Every day | ORAL | Status: DC

## 2023-10-12 MED ORDER — DOXERCALCIFEROL 4 MCG/2ML IV SOLN: 5.0000 ug | INTRAVENOUS | Status: DC

## 2023-10-12 MED ORDER — LIDOCAINE HCL (PF) 1 % IJ SOLN: 5.0000 mL | INTRAMUSCULAR | Status: DC | PRN

## 2023-10-12 MED ORDER — DOXERCALCIFEROL 4 MCG/2ML IV SOLN: 7.0000 ug | INTRAVENOUS | Status: DC

## 2023-10-12 MED ORDER — SODIUM ZIRCONIUM CYCLOSILICATE 10 G PO PACK
10.0000 g | PACK | Freq: Once | ORAL | Status: AC
  Administered 2023-10-12: 10 g via ORAL
  Filled 2023-10-12: qty 1

## 2023-10-12 MED ORDER — PERFLUTREN LIPID MICROSPHERE
1.0000 mL | INTRAVENOUS | Status: AC | PRN
  Administered 2023-10-12: 2 mL via INTRAVENOUS

## 2023-10-12 MED ORDER — CHLORHEXIDINE GLUCONATE CLOTH 2 % EX PADS
6.0000 | MEDICATED_PAD | Freq: Every day | CUTANEOUS | Status: DC
  Administered 2023-10-13: 6 via TOPICAL

## 2023-10-12 MED ORDER — MIDODRINE HCL 5 MG PO TABS
10.0000 mg | ORAL_TABLET | Freq: Three times a day (TID) | ORAL | Status: DC
  Administered 2023-10-12 – 2023-10-13 (×5): 10 mg via ORAL
  Filled 2023-10-12 (×5): qty 2

## 2023-10-12 MED ORDER — DIGOXIN 125 MCG PO TABS: 0.1250 mg | ORAL_TABLET | ORAL | Status: DC

## 2023-10-12 MED ORDER — ASPIRIN 81 MG PO CHEW
81.0000 mg | CHEWABLE_TABLET | Freq: Every day | ORAL | Status: DC
  Administered 2023-10-12: 81 mg via ORAL
  Filled 2023-10-12: qty 1

## 2023-10-12 MED ORDER — CLOPIDOGREL BISULFATE 75 MG PO TABS
75.0000 mg | ORAL_TABLET | Freq: Every day | ORAL | Status: DC
  Administered 2023-10-12: 75 mg via ORAL
  Filled 2023-10-12: qty 1

## 2023-10-12 MED ORDER — IOHEXOL 350 MG/ML SOLN
75.0000 mL | Freq: Once | INTRAVENOUS | Status: AC | PRN
  Administered 2023-10-12: 75 mL via INTRAVENOUS

## 2023-10-12 MED ORDER — WARFARIN SODIUM 5 MG PO TABS
10.0000 mg | ORAL_TABLET | Freq: Once | ORAL | Status: AC
  Administered 2023-10-12: 10 mg via ORAL
  Filled 2023-10-12: qty 2

## 2023-10-12 MED ORDER — DIGOXIN 125 MCG PO TABS
0.1250 mg | ORAL_TABLET | ORAL | Status: DC
  Administered 2023-10-12: 0.125 mg via ORAL
  Filled 2023-10-12: qty 1

## 2023-10-12 NOTE — Consult Note (Addendum)
 [name] Renal Consultation Note    Indication for Consultation:  Management of ESRD/hemodialysis, anemia, hypertension/volume, and secondary hyperparathyroidism.   HPI: [name] is a 63 y.o. male with PMH of DM2, ESRD, neuropathy, CHF, cardiomyopathy, CAD, GERD, and afib who awoke Saturday morning with a small ark spot that progressed to blurry vision as the day progressed. He contacted his PCP and they advised him to go to the ED to rule out stroke. CT was negative for acute abnormalities. Ophthalmology consulted after acute CVA was ruled out.   We have been consulted for management of his ESRD while in the hospital. He is a patient at [facility] and dialyzes MWF. He does not skip treatments, and does not sign off early during treatment. He denies any dyspnea, CP, SOB, abdominal pain, or LE swelling. He has LFA AVF with +b/t. He was found to have elevated K at 6.2 on admission. Lokelma  and HD orders placed.   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 Sharyn Deforest follows   Diabetes mellitus    pt. states he's borderline diabetic., no longer taking med,- off med. since 2013   Dialysis patient [facility])    Mon-Wed-Fri([facility])- Left AV fistula   Diverticulitis 03/2015   reoccurred in December 2016   Diverticulitis of intestine without perforation or abscess without bleeding 04/21/2015   Dizziness 11/22/2018   DVT (deep venous thrombosis) (HCC)    ESRD (end stage renal disease) (HCC)    GERD  (gastroesophageal reflux disease)    Gout    Hemorrhage of left kidney 01/13/2018   History of nephrectomy 07/04/2012   History of right nephrectomy in 2002 for renal cell carcinoma    History of unilateral nephrectomy    Hypertension    Hypotension due to hypovolemia 06/04/2020   Low iron     Myocardial infarction [facility]) ?2006   Renal cell carcinoma    dialysis- MWF- [address]- Dr. Harry Lindau follows.   Renal insufficiency    Shortness of breath 05/19/2011   "at rest, lying down, w/exertion"   Stroke [facility]) 02/2011   05/19/11 denies residual   Umbilical hernia 05/19/2011   unrepaired   Wears glasses    Past Surgical History:  Procedure Laterality Date   A/V FISTULAGRAM N/A 06/30/2023   Procedure: A/V Fistulagram;  Surgeon: Patrick Boor, MD;  Location: [facility] INVASIVE CV LAB;  Service: Cardiovascular;  Laterality: N/A;   ANAL FISTULOTOMY  08/15/2018   AV FISTULA PLACEMENT  03/29/2011   Procedure: ARTERIOVENOUS (AV) FISTULA CREATION;  Surgeon: Arleta Lade, MD;  Location: [facility] OR;  Service: Vascular;  Laterality: Left;  LEFT RADIOCEPHALIC , Arteriovenous [phone])   CARDIAC CATHETERIZATION  05/21/11   CARDIAC CATHETERIZATION N/A 03/16/2016   Procedure: Left Heart Cath and Coronary Angiography;  Surgeon: Pasqual Bone, MD;  Location: [facility] INVASIVE CV LAB;  Service: Cardiovascular;  Laterality: N/A;   CHOLECYSTECTOMY N/A 02/12/2016   Procedure: LAPAROSCOPIC CHOLECYSTECTOMY WITH INTRAOPERATIVE CHOLANGIOGRAM;  Surgeon: Adalberto Hollow, MD;  Location:  [facility] OR;  Service: General;  Laterality: N/A;   COLONOSCOPY WITH PROPOFOL  N/A 02/01/2017   Procedure: COLONOSCOPY WITH PROPOFOL ;  Surgeon: Tami Falcon, MD;  Location: [facility] ENDOSCOPY;  Service: Endoscopy;  Laterality: N/A;   dialysis cath placed     ESOPHAGOGASTRODUODENOSCOPY (EGD) WITH PROPOFOL  N/A 12/02/2015   Procedure: ESOPHAGOGASTRODUODENOSCOPY (EGD) WITH PROPOFOL ;  Surgeon: Tami Falcon, MD;  Location: [facility] ENDOSCOPY;  Service: Endoscopy;   Laterality: N/A;   EVALUATION UNDER ANESTHESIA WITH HEMORRHOIDECTOMY N/A 08/15/2018   Procedure: HEMORRHOID LIGATION/PEXY;  Surgeon: Candyce Champagne, MD;  Location: [facility] OR;  Service: General;  Laterality: N/A;   FINGER SURGERY     L pinkie finger- ORIF- /w remaining hardware - 1990's     FISTULOTOMY N/A 08/15/2018   Procedure: SUPERFICIAL ANAL FISTULOTOMY;  Surgeon: Candyce Champagne, MD;  Location: [facility] OR;  Service: General;  Laterality: N/A;   HEMORRHOID SURGERY     HERNIA REPAIR N/A 07/04/2012   Umbilical hernia repair   INSERTION OF DIALYSIS CATHETER N/A 01/27/2016   Procedure: INSERTION OF DIALYSIS CATHETER;  Surgeon: Adine Hoof, MD;  Location: [facility] OR;  Service: Vascular;  Laterality: N/A;   INSERTION OF MESH N/A 07/04/2012   Procedure: INSERTION OF MESH;  Surgeon: Evander Hills, DO;  Location: [facility] OR;  Service: General;  Laterality: N/A;   IR EMBO ART  VEN HEMORR LYMPH EXTRAV  INC GUIDE ROADMAPPING  01/13/2018   IR FLUORO GUIDE CV LINE RIGHT  01/13/2018   IR IVC FILTER PLMT / S&I /IMG GUID/MOD SED  01/27/2018   IR RENAL SELECTIVE  UNI INC S&I MOD SED  01/13/2018   IR US  GUIDE VASC ACCESS RIGHT  01/13/2018   KIDNEY CYST REMOVAL  2019   LAPAROSCOPIC LYSIS OF ADHESIONS N/A 02/12/2016   Procedure: LAPAROSCOPIC LYSIS OF ADHESIONS;  Surgeon: Adalberto Hollow, MD;  Location: [facility] OR;  Service: General;  Laterality: N/A;   LEFT AND RIGHT HEART CATHETERIZATION WITH CORONARY ANGIOGRAM N/A 08/04/2012   Procedure: LEFT AND RIGHT HEART CATHETERIZATION WITH CORONARY ANGIOGRAM;  Surgeon: Darrold Emms, MD;  Location: [facility] CATH LAB;  Service: Cardiovascular;  Laterality: N/A;   NEPHRECTOMY  2000   right   PERIPHERAL VASCULAR BALLOON ANGIOPLASTY Left 06/30/2023   Procedure: PERIPHERAL VASCULAR BALLOON ANGIOPLASTY;  Surgeon: Patrick Boor, MD;  Location: [facility] INVASIVE CV LAB;  Service: Cardiovascular;  Laterality: Left;  60% cephalic vein   REVERSE SHOULDER ARTHROPLASTY Right 09/20/2022   Procedure: REVERSE SHOULDER  ARTHROPLASTY;  Surgeon: Janeth Medicus, MD;  Location: [facility] OR;  Service: Orthopedics;  Laterality: Right;  120   REVERSE SHOULDER ARTHROPLASTY Left 03/18/2023   Procedure: REVERSE SHOULDER ARTHROPLASTY;  Surgeon: Janeth Medicus, MD;  Location: [facility] OR;  Service: Orthopedics;  Laterality: Left;  120   REVISON OF ARTERIOVENOUS FISTULA Left 06/05/2013   Procedure: REVISON OF LEFT RADIOCEPHALIC  ARTERIOVENOUS FISTULA;  Surgeon: Arvil Lauber, MD;  Location: [facility] OR;  Service: Vascular;  Laterality: Left;   REVISON OF ARTERIOVENOUS FISTULA Left 01/27/2016   Procedure: REVISION OF LEFT UPPER EXTREMITY ARTERIOVENOUS FISTULA;  Surgeon: Adine Hoof, MD;  Location: [facility] OR;  Service: Vascular;  Laterality: Left;   REVISON OF ARTERIOVENOUS FISTULA Left 08/03/2016   Procedure: REVISON OF Left arm ARTERIOVENOUS FISTULA;  Surgeon: Richrd Char, MD;  Location: [facility] OR;  Service: Vascular;  Laterality: Left;   REVISON OF ARTERIOVENOUS FISTULA Left 05/06/2017   Procedure: REVISION PLICATION OF ARTERIOVENOUS FISTULA LEFT ARM;  Surgeon: Mayo Speck, MD;  [facility] OR;  Service: Vascular;  Laterality: Left;   REVISON OF ARTERIOVENOUS FISTULA Left 02/01/2019   Procedure: REVISION OF ARTERIOVENOUS FISTULA LEFT ARM;  Surgeon: Young Hensen, MD;  Location: [facility] OR;  Service: Vascular;  Laterality: Left;   REVISON OF ARTERIOVENOUS FISTULA Left 03/15/2019   Procedure: REVISION PLICATION OF ARTERIOVENOUS FISTULA LEFT ARM;  Surgeon: Young Hensen, MD;  [facility] OR;  Service: Vascular;  Laterality: Left;   RIGHT HEART CATHETERIZATION N/A 05/21/2011   Procedure: RIGHT HEART CATH;  Surgeon: Darrold Emms, MD;  [facility]: [facility] CATH LAB;  Service: Cardiovascular;  Laterality: N/A;   smashed  1990's   "left pinky; have a plate in there"   UMBILICAL HERNIA REPAIR N/A 07/04/2012   Procedure: LAPAROSCOPIC UMBILICAL HERNIA;  Surgeon: Evander Hills, DO;  Location: [facility] OR;  Service: General;  Laterality:  N/A;   US  ECHOCARDIOGRAPHY  05/20/11   Family History  Problem Relation Age of Onset   Hypertension Mother    Kidney disease Mother    Social History:  reports that he has never smoked. He has never used smokeless tobacco. He reports current drug use. Drug: Hydrocodone . He reports that he does not drink alcohol.  ROS: As per HPI otherwise negative.  Physical Exam: Vitals:   10/12/23 0219 10/12/23 0340 10/12/23 0741 10/12/23 1213  BP: (!) 114/46 132/77 128/79 139/82  Pulse: 85 89 87 79  Resp: 20 20 17 17   Temp: 98.9 F (37.2 C) 98.1 F (36.7 C) 98.6 F (37 C) 97.7 F (36.5 C)  TempSrc: Oral Oral Oral Oral  SpO2: 96% 97% 100% 100%  Weight:  105.6 kg    Height:         General: Well developed, well nourished, in no acute distress. Head: Normocephalic, atraumatic, sclera non-icteric, mucus membranes are moist. Neck: Supple without lymphadenopathy/masses. JVD not elevated. Lungs: CTA bilaterally, no wheeze or rales appreciated. Heart: irregular rhythm noted, no JVD  Abdomen: Soft, non-tender, non-distended with normoactive bowel sounds. No rebound/guarding. No obvious abdominal masses. Musculoskeletal:  Strength and tone appear normal for age. Lower extremities: No edema or ischemic changes, no open wounds. Neuro: Alert and oriented X 3. Moves all extremities spontaneously. Psych:  Responds to questions appropriately with a normal affect. Dialysis Access: LFA AVF +b/t  Allergies  Allergen Reactions   Ace Inhibitors Itching, Cough and Other (See Comments)   Prior to Admission medications   Medication Sig Start Date End Date Taking? Authorizing Provider  acetaminophen  (TYLENOL ) 500 MG tablet Take 500 mg by mouth as needed.   Yes [provider]  albuterol  (VENTOLIN  HFA) 108 (90 Base) MCG/ACT inhaler Inhale 2 puffs into the lungs every 6 (six) hours as needed for wheezing or shortness of breath. 09/18/19  Yes Susanna Epley, FNP  calcium  acetate (PHOSLO ) 667 MG capsule  Take 1-3 capsules (667-2,001 mg total) by mouth See admin instructions. Take 2001 mg capsules with meals three times daily and 667 mg capsule with snacks. 11/23/18  Yes Pasqual Bone, MD  digoxin  (LANOXIN ) 0.125 MG tablet Take 1 tablet (0.125 mg total) by mouth every Monday, Wednesday, and Friday. 06/09/20  Yes Rai, Ripudeep K, MD  gabapentin  (NEURONTIN ) 300 MG capsule Take 1 capsule (300 mg total) by mouth at bedtime. 07/05/23  Yes Susanna Epley, FNP  loperamide  (IMODIUM  A-D) 2 MG tablet Take 1 tablet (2 mg total) by mouth 4 (four) times daily as needed for diarrhea or loose stools. Also available OTC. 06/09/20  Yes Rai, Hurman Maiden, MD  midodrine  (PROAMATINE )  10 MG tablet Take 1 tablet (10 mg total) by mouth 3 (three) times daily. 06/09/20  Yes Rai, Ripudeep K, MD  pantoprazole  (PROTONIX ) 40 MG tablet Take 40 mg by mouth 2 (two) times daily.   Yes [provider]  warfarin (COUMADIN ) 7.5 MG tablet Take 10 mg by mouth daily.   Yes [provider]  Control Gel Formula Dressing (DUODERM CGF EXTRA THIN) MISC Apply 1 each topically daily as needed. Patient not taking: Reported on 10/11/2023 10/26/20   Susanna Epley, FNP  glucose blood test strip Use to check blood sugar 3 times a day. Dx code e11.65 10/13/21   Moore, Janece, FNP  HYDROcodone -acetaminophen  (NORCO) 7.5-325 MG tablet Take 1 tablet by mouth every 6 (six) hours as needed for moderate pain. Patient not taking: Reported on 10/11/2023 09/21/22   McClung, Kevan D, PA  Lactobacillus (ACIDOPHILUS/BIFIDUS) WAFR Take 1 Wafer by mouth daily. Patient not taking: Reported on 10/11/2023 07/01/22   Susanna Epley, FNP  Lancets [facility] DELICA PLUS [place]) MISC Use to check blood sugar 3 times a day. Dx code e11.65 10/13/21   Moore, Janece, FNP  lidocaine  (LIDODERM ) 5 % Place 1 patch onto the skin daily. Remove & Discard patch within 12 hours or as directed by MD Patient not taking: Reported on 10/11/2023 09/11/22   Aleatha Hunting T, PA-C  meclizine   (ANTIVERT ) 25 MG tablet Take 1 tablet (25 mg total) by mouth 3 (three) times daily as needed for dizziness. Patient not taking: Reported on 10/11/2023 04/08/21   Horton, Gillian Lacrosse M, DO  metoprolol  succinate (TOPROL -XL) 25 MG 24 hr tablet Take 1 tablet (25 mg total) by mouth daily. Patient not taking: Reported on 10/11/2023 06/09/20   Rai, Hurman Maiden, MD  ondansetron  (ZOFRAN  ODT) 4 MG disintegrating tablet Take 1 tablet (4 mg total) by mouth every 8 (eight) hours as needed for nausea or vomiting. Patient not taking: Reported on 10/11/2023 06/09/20   Rai, Hurman Maiden, MD  ondansetron  (ZOFRAN ) 4 MG tablet Take 1 tablet (4 mg total) by mouth every 6 (six) hours. Patient not taking: Reported on 10/11/2023 07/12/23   Burnette Carte, MD  oxyCODONE  (ROXICODONE ) 5 MG immediate release tablet Take 1 tablet (5 mg total) by mouth every 6 (six) hours as needed for moderate pain (pain score 4-6) or severe pain (pain score 7-10). Patient not taking: Reported on 10/11/2023 03/19/23 03/18/24  Janeth Medicus, MD  UNABLE TO FIND Out patient physical therapy- vestibular therapy for BPPV 08/01/18   Rizwan, Saima, MD   Current Facility-Administered Medications  Medication Dose Route Frequency Provider Last Rate Last Admin   acetaminophen  (TYLENOL ) tablet 650 mg  650 mg Oral Q6H PRN Bary Boss, DO   650 mg at 10/12/23 1158   [START ON 10/13/2023] Chlorhexidine  Gluconate Cloth 2 % PADS 6 each  6 each Topical Q0600 Nan Aver, MD       [START ON 10/14/2023] digoxin  (LANOXIN ) tablet 0.125 mg  0.125 mg Oral Q M,W,F Paytes, Austin A, RPH       gabapentin  (NEURONTIN ) capsule 300 mg  300 mg Oral QPM Faith Homes, MD       LORazepam  (ATIVAN ) injection 0.5 mg  0.5 mg Intravenous Q6H PRN Reesa Cannon N, DO   0.5 mg at 10/12/23 8119   melatonin tablet 5 mg  5 mg Oral QHS PRN Bary Boss, DO       midodrine  (PROAMATINE ) tablet 10 mg  10 mg Oral TID WC Bary Boss,  DO   10 mg at 10/12/23 1158   polyethylene glycol  (MIRALAX  / GLYCOLAX ) packet 17 g  17 g Oral Daily PRN Bary Boss, DO       prochlorperazine  (COMPAZINE ) injection 5 mg  5 mg Intravenous Q6H PRN Reesa Cannon N, DO       warfarin (COUMADIN ) tablet 10 mg  10 mg Oral ONCE-1600 Paytes, Austin A, RPH       Warfarin - Pharmacist Dosing Inpatient   Does not apply q1600 Yvonne Hering, [facility]   Given at 10/11/23 2245   Labs: Basic Metabolic Panel: Recent Labs  Lab 10/11/23 1225 10/12/23 0512  NA 136 135  K 5.1 6.2*  CL 94* 96*  CO2 28 28  GLUCOSE 94 95  BUN 25* 30*  CREATININE 7.66* 9.14*  CALCIUM  9.7 8.5*  PHOS 5.3* 7.2*   Liver Function Tests: Recent Labs  Lab 10/11/23 1225 10/12/23 0512  AST 12*  --   ALT 6  --   ALKPHOS 116  --   BILITOT 0.6  --   PROT 8.2*  --   ALBUMIN  3.9 3.1*   No results for input(s): "LIPASE", "AMYLASE" in the last 168 hours. No results for input(s): "AMMONIA" in the last 168 hours. CBC: Recent Labs  Lab 10/11/23 1225 10/12/23 0512  WBC 4.9 5.3  HGB 11.6* 11.2*  HCT 36.9* 34.8*  MCV 93.9 92.1  PLT 218 233   Cardiac Enzymes: No results for input(s): "CKTOTAL", "CKMB", "CKMBINDEX", "TROPONINI" in the last 168 hours. CBG: Recent Labs  Lab 10/11/23 1244 10/11/23 2111 10/12/23 0751 10/12/23 1212  GLUCAP 81 86 66* 74   Iron  Studies: No results for input(s): "IRON ", "TIBC", "TRANSFERRIN", "FERRITIN" in the last 72 hours. Studies/Results: MR BRAIN WO CONTRAST Result Date: 10/12/2023 CLINICAL DATA:  Neuro deficit with stroke suspected. Chronic cervical myelopathy. EXAM: MRI HEAD WITHOUT CONTRAST MRI CERVICAL SPINE WITHOUT CONTRAST TECHNIQUE: Multiplanar, multiecho pulse sequences of the brain and surrounding structures, and cervical spine, to include the craniocervical junction and cervicothoracic junction, were obtained without intravenous contrast. COMPARISON:  Brain MRI 04/08/2021 FINDINGS: MRI HEAD FINDINGS Brain: No acute infarction, hemorrhage, hydrocephalus, extra-axial collection or mass  lesion. Small FLAIR hyperintensities scattered in the cerebral white matter attributed to chronic small vessel ischemia with lacunes. Chronic lacunar infarcts in the bilateral pons. Mild for age cerebral volume loss which is generalized. Vascular: Normal flow voids. Skull and upper cervical spine: Normal marrow signal Sinuses/Orbits: Unremarkable MRI CERVICAL SPINE FINDINGS Alignment: Reversal of cervical lordosis. Vertebrae: No fracture, evidence of discitis, or bone lesion. Fatty degenerative marrow conversion at multiple endplates. Cord: Normal signal and morphology when allowing for motion artifact Posterior Fossa, vertebral arteries, paraspinal tissues: No perispinal mass or inflammation Disc levels: C2-3: Degenerative facet spurring and mild disc narrowing. No neural impingement C3-4: Disc collapse with endplate and uncovertebral ridging. Degenerative facet spurring which is moderate on the left. At least moderate left foraminal narrowing C4-5: Disc collapse with endplate and uncovertebral ridging. Estimate moderate bilateral foraminal narrowing. Patent spinal canal. C5-6: Disc narrowing and endplate degeneration with uncovertebral and facet spurring. Estimate mild spinal stenosis and mild bilateral foraminal narrowing. C6-7: Disc narrowing and bulging with endplate and uncovertebral ridging. There is facet spurring and ligamentum flavum thickening. Estimate moderate spinal stenosis with midline canal diameter of 7 mm. Advanced right foraminal stenosis. C7-T1:Degenerative facet spurring to a moderate degree. Mild disc bulging. IMPRESSION: Brain MRI: No acute finding. 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 Cervical spine:  Moderate motion artifact. Generalized degeneration with up to moderate spinal stenosis at C6-7. Multilevel foraminal narrowing which appears focally advanced at C6-7 on the right. Electronically Signed   By: Ronnette Coke M.D.   On: 10/12/2023 05:10   MR CERVICAL SPINE  WO CONTRAST Result Date: 10/12/2023 CLINICAL DATA:  Neuro deficit with stroke suspected. Chronic cervical myelopathy. EXAM: MRI HEAD WITHOUT CONTRAST MRI CERVICAL SPINE WITHOUT CONTRAST TECHNIQUE: Multiplanar, multiecho pulse sequences of the brain and surrounding structures, and cervical spine, to include the craniocervical junction and cervicothoracic junction, were obtained without intravenous contrast. COMPARISON:  Brain MRI 04/08/2021 FINDINGS: MRI HEAD FINDINGS Brain: No acute infarction, hemorrhage, hydrocephalus, extra-axial collection or mass lesion. Small FLAIR hyperintensities scattered in the cerebral white matter attributed to chronic small vessel ischemia with lacunes. Chronic lacunar infarcts in the bilateral pons. Mild for age cerebral volume loss which is generalized. Vascular: Normal flow voids. Skull and upper cervical spine: Normal marrow signal Sinuses/Orbits: Unremarkable MRI CERVICAL SPINE FINDINGS Alignment: Reversal of cervical lordosis. Vertebrae: No fracture, evidence of discitis, or bone lesion. Fatty degenerative marrow conversion at multiple endplates. Cord: Normal signal and morphology when allowing for motion artifact Posterior Fossa, vertebral arteries, paraspinal tissues: No perispinal mass or inflammation Disc levels: C2-3: Degenerative facet spurring and mild disc narrowing. No neural impingement C3-4: Disc collapse with endplate and uncovertebral ridging. Degenerative facet spurring which is moderate on the left. At least moderate left foraminal narrowing C4-5: Disc collapse with endplate and uncovertebral ridging. Estimate moderate bilateral foraminal narrowing. Patent spinal canal. C5-6: Disc narrowing and endplate degeneration with uncovertebral and facet spurring. Estimate mild spinal stenosis and mild bilateral foraminal narrowing. C6-7: Disc narrowing and bulging with endplate and uncovertebral ridging. There is facet spurring and ligamentum flavum thickening. Estimate  moderate spinal stenosis with midline canal diameter of 7 mm. Advanced right foraminal stenosis. C7-T1:Degenerative facet spurring to a moderate degree. Mild disc bulging. IMPRESSION: Brain MRI: No acute finding. 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 Cervical spine: Moderate motion artifact. Generalized degeneration with up to moderate spinal stenosis at C6-7. Multilevel foraminal narrowing which appears focally advanced at C6-7 on the right. Electronically Signed   By: Ronnette Coke M.D.   On: 10/12/2023 05:10   CT Head Wo Contrast Result Date: 10/11/2023 CLINICAL DATA:  Provided history: Vision loss, monocular. Additional history provided: The patient reports double vision and weakness. EXAM: CT HEAD WITHOUT CONTRAST TECHNIQUE: Contiguous axial images were obtained from the base of the skull through the vertex without intravenous contrast. RADIATION DOSE REDUCTION: This exam was performed according to the departmental dose-optimization program which includes automated exposure control, adjustment of the mA and/or kV according to patient size and/or use of iterative reconstruction technique. COMPARISON:  Brain MRI 04/08/2021. FINDINGS: Brain: Mild generalized cerebral atrophy. Unchanged chronic lacunar infarcts within the left lentiform nucleus/retrolenticular white matter and within the pons. Multifocal T2 FLAIR hyperintense signal abnormality elsewhere within the cerebral white matter, nonspecific but compatible with mild chronic small vessel ischemic disease. There is no acute intracranial hemorrhage. No demarcated cortical infarct. No extra-axial fluid collection. No evidence of an intracranial mass. No midline shift. Vascular: No hyperdense vessel.  Atherosclerotic calcifications. Skull: No calvarial fracture or aggressive osseous lesion. Sinuses/Orbits: No mass or acute finding within the imaged orbits. No significant paranasal sinus disease. IMPRESSION: 1. No acute intracranial  finding. 2. Unchanged chronic lacunar infarcts within the left lentiform nucleus and adjacent white matter, and within the pons. 3. Background mild cerebral white matter chronic small  vessel ischemic disease. 4. Mild generalized cerebral atrophy. Electronically Signed   By: Bascom Lily D.O.   On: 10/11/2023 14:29    Dialysis Orders:  [facility]; MWF; 4 hrs Qb 450/ DFR 600 2K/ 2.5 Ca Heparin  5000 U with 2000 U midrun bolus EDW 103.7 kg Mircera 30 mcg q 2 weeks (last dose 09/30/23) Hectorol  7 mcg 3 x week at HD Parsabiv 15 mg 3 x week at HD  Assessment/Plan:  Vision change: reported painless vision loss in left eye over 1 week ago. Code stroke called but imaging ruled out stroke. 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 Recommended to f/u with retina specialist.  ESRD:  patient on HD MWF at [facility]. No seen missed treatments and patient reports good compliance with treatment time and fluid restrictions. Plan for HD today to keep patient on normal HD schedule.  Hyperkalemia: Patient's K was 6.2 on admission. One Dose of Lokelma  given today. Plan for HD today.  Atrial Fib: on Warfarin for anticoagulation. Per primary and pharm.   Hypertension/volume: BP at goal today. Patient normally takes midodrine   Anemia: Hgb 11.2 today. Patient received last ESA on 09/30/23.   Metabolic bone disease: Cor Ca 9.2. Phos above goal at 7.2.   Nutrition:  Albumin  3.1. On renal diet with fluid restrictions.   Hersey Lorenzo, NP 10/12/2023, 2:20 PM  [facility]    Seen and examined independently.  Agree with note and exam as documented above by physician extender and as noted here.   Mr. Barto is a pleasant gentleman with a history of ESRD on HD MWF at [facility], DM, CHF and cardiomyopathy who presented to the hospital with vision changes for several days.  Opthalmology was consulted.   No acute finding on MRI or CT. States that his lesions on his arm are now healing - they are sticking  away from these  General adult male in bed in no acute distress HEENT normocephalic atraumatic extraocular movements intact sclera anicteric Neck supple trachea midline Lungs clear to auscultation bilaterally normal work of breathing at rest on room air  Heart S1S2 no rub Abdomen soft nontender nondistended Extremities no pitting edema  Psych normal mood and affect LUE AVF with healing lesions on arm with bruit and thrill   Vision change - per primary team and ophthalmology.  Imaging not suggestive of stroke   ESRD - on HD MWF - HD today.  He requests a goal of 3 kg if tolerated citing his CHF. Adjusted order.    Hyperkalemia - lokelma  today and HD today  Afib - noted.  Rate control per primary team   Chronic hypotension - on midodrine    Anemia of CKD - recent ESA and note Hb   Metabolic bone disease - resume home binders and hectorol . No parsabiv inpt  Thank you for the consult.  Please do not hesitate to contact me with any questions regarding our patient   Nan Aver, MD 10/12/2023  5:46 PM

## 2023-10-12 NOTE — Assessment & Plan Note (Signed)
-   gets worsening pains even on non-HD days - will continue gaba 300 mg daily for now

## 2023-10-12 NOTE — Assessment & Plan Note (Signed)
-   Reported worse after HD sessions - Continue midodrine

## 2023-10-12 NOTE — Assessment & Plan Note (Addendum)
-   reports to ophthalmology that vision changes were approximately at least 1 week (but he seems to emphasize sxms mostly Saturday morning); painless vision loss affecting left eye central vision -MRI brain unremarkable for acute findings.  Shows chronic lacunar infarcts -Evaluated by ophthalmology on 10/12/2023.  Findings deemed consistent with BRAO - He is recommended to follow-up outpatient with retinal specialist, nonurgent -No significant stenosis noted on CTA head/neck - Patient discussed with cardiology and declined changing anticoagulation and wished to continue on Coumadin  and states good compliance outpatient with usually controlled INR levels.  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

## 2023-10-12 NOTE — Assessment & Plan Note (Signed)
-   No home meds -Last A1c 4.8% on 10/12/2023 - Continue diet control

## 2023-10-12 NOTE — Assessment & Plan Note (Signed)
-   has postdialysis hypotension and is on midodrine

## 2023-10-12 NOTE — Progress Notes (Signed)
 Off the floor for Hemodialysis

## 2023-10-12 NOTE — Assessment & Plan Note (Addendum)
-   On Coumadin  outpatient - Continue Coumadin  per pharmacy - No longer on Toprol  per med rec - continue digoxin   -Monitor rate for now

## 2023-10-12 NOTE — Assessment & Plan Note (Signed)
-   Complicates overall prognosis and care - Body mass index is 30.7 kg/m.

## 2023-10-12 NOTE — Progress Notes (Signed)
 Progress Note    [name]   NFA:[id number]  DOB: 1961-01-08  DOA: 10/11/2023     1 PCP: Susanna Epley, FNP  Initial CC: Left eye blurry vision  Hospital Course: [name] is a 63 yo male with PMH ESRD on HD, chronic right eye blurry vision, CAD, DMII, diverticulitis, DVT, GERD, HTN, gout who presented with left eye vision changes.  He had noticed vision changes occurring upon waking up on 10/08/2023. Due to no improvement over the past few days, he finally presented to the ER for further evaluation. He was admitted for further stroke workup.  Interval History:  Resting comfortably in bed this morning.  Has chronic right eye blurry vision and now has new left eye blurry vision since the weekend at least. Is due for dialysis today. Has no other neurologic changes at this time.  Assessment and Plan: * Blurred vision, left eye - reports to ophthalmology that vision changes were approximately at least 1 week (but he seems to emphasize sxms mostly Saturday morning); painless vision loss affecting left eye central vision -MRI brain unremarkable for acute findings.  Shows chronic lacunar infarcts -Evaluated by ophthalmology on 10/12/2023.  Findings deemed consistent with BRAO - He is recommended to follow-up outpatient with retinal specialist, nonurgent -Follow-up CT angio head/neck - follow up echo  - neurology also following   End-stage renal disease on hemodialysis [facility]) - Nephrology consulted - Continue dialysis per nephrology  Paroxysmal atrial flutter (HCC) - On Coumadin  outpatient - Continue Coumadin  per pharmacy - No longer on Toprol  per med rec - continue digoxin   -Monitor rate for now  Wheelchair dependent - Uses walker at times but also depends on wheelchair - He remains at his baseline  Hypotension - Reported worse after HD sessions - Continue midodrine   Type 2 diabetes mellitus with end-stage renal disease (HCC) - No home meds -Last A1c 4.8% on 10/12/2023 -  Continue diet control  Hyperkalemia - Elevated 10/12/2023 -Lokelma  given per nephrology and planning for dialysis as well  Neuropathy - gets worsening pains even on non-HD days - will continue gaba 300 mg daily for now  Obesity (BMI 30-39.9) - Complicates overall prognosis and care - Body mass index is 30.7 kg/m.  Hyperlipidemia, unspecified - LDL 108 - No statin noted on med rec - Started on Lipitor 40 mg per neurology  Hypertension, benign - has postdialysis hypotension and is on midodrine    Old records reviewed in assessment of this patient  Antimicrobials:   DVT prophylaxis:   warfarin (COUMADIN ) tablet 10 mg   Code Status:   Code Status: Full Code  Mobility Assessment (Last 72 Hours)     Mobility Assessment     Row Name 10/12/23 0800 10/11/23 2030         Does patient have an order for bedrest or is patient medically unstable No - Continue assessment No - Continue assessment      What is the highest level of mobility based on the progressive mobility assessment? Level 5 (Walks with assist in room/hall) - Balance while stepping forward/back and can walk in room with assist - Complete Level 5 (Walks with assist in room/hall) - Balance while stepping forward/back and can walk in room with assist - Complete               Barriers to discharge: None Disposition Plan: Home HH orders placed: TBD Status is: Inpatient  Objective: Blood pressure 139/82, pulse 79, temperature 97.7 F (36.5 C), temperature source  Oral, resp. rate 17, height 6\' 1"  (1.854 m), weight 105.6 kg, SpO2 100%.  Examination:  Physical Exam Constitutional:      General: He is not in acute distress.    Appearance: Normal appearance.  HENT:     Head: Normocephalic and atraumatic.     Mouth/Throat:     Mouth: Mucous membranes are moist.  Eyes:     Comments: Dilated B/L pupils, difficulty with interpreting reactive due to photosensitivity. Grossly blurry vision now bilaterally   Cardiovascular:     Rate and Rhythm: Normal rate and regular rhythm.  Pulmonary:     Effort: Pulmonary effort is normal. No respiratory distress.     Breath sounds: Normal breath sounds. No wheezing.  Abdominal:     General: Bowel sounds are normal. There is no distension.     Palpations: Abdomen is soft.     Tenderness: There is no abdominal tenderness.  Musculoskeletal:        General: Normal range of motion.     Cervical back: Normal range of motion and neck supple.  Skin:    General: Skin is warm and dry.  Neurological:     General: No focal deficit present.     Mental Status: He is alert.  Psychiatric:        Mood and Affect: Mood normal.        Behavior: Behavior normal.      Consultants:  Neurology Nephrology Ophthalmology  Procedures:    Data Reviewed: Results for orders placed or performed during the hospital encounter of 10/11/23 (from the past 24 hours)  MRSA Next Gen by PCR, Nasal     Status: None   Collection Time: 10/11/23  9:10 PM   Specimen: Nasal Mucosa; Nasal Swab  Result Value Ref Range   MRSA by PCR Next Gen NOT DETECTED NOT DETECTED  Glucose, capillary     Status: None   Collection Time: 10/11/23  9:11 PM  Result Value Ref Range   Glucose-Capillary 86 70 - 99 mg/dL  TSH     Status: None   Collection Time: 10/12/23 12:17 AM  Result Value Ref Range   TSH 1.692 0.350 - 4.500 uIU/mL  Vitamin B12     Status: None   Collection Time: 10/12/23 12:17 AM  Result Value Ref Range   Vitamin B-12 407 180 - 914 pg/mL  Folate     Status: Abnormal   Collection Time: 10/12/23 12:17 AM  Result Value Ref Range   Folate 5.8 (L) >5.9 ng/mL  CBC     Status: Abnormal   Collection Time: 10/12/23  5:12 AM  Result Value Ref Range   WBC 5.3 4.0 - 10.5 K/uL   RBC 3.78 (L) 4.22 - 5.81 MIL/uL   Hemoglobin 11.2 (L) 13.0 - 17.0 g/dL   HCT 16.1 (L) 09.6 - 04.5 %   MCV 92.1 80.0 - 100.0 fL   MCH 29.6 26.0 - 34.0 pg   MCHC 32.2 30.0 - 36.0 g/dL   RDW 40.9 (H) 81.1 -  15.5 %   Platelets 233 150 - 400 K/uL   nRBC 0.0 0.0 - 0.2 %  Renal function panel     Status: Abnormal   Collection Time: 10/12/23  5:12 AM  Result Value Ref Range   Sodium 135 135 - 145 mmol/L   Potassium 6.2 (H) 3.5 - 5.1 mmol/L   Chloride 96 (L) 98 - 111 mmol/L   CO2 28 22 - 32 mmol/L   Glucose, Bld 95  70 - 99 mg/dL   BUN 30 (H) 6 - 20 mg/dL   Creatinine, Ser 1.61 (H) 0.61 - 1.24 mg/dL   Calcium  8.5 (L) 8.9 - 10.3 mg/dL   Phosphorus 7.2 (H) 2.5 - 4.6 mg/dL   Albumin  3.1 (L) 3.5 - 5.0 g/dL   GFR, Estimated 6 (L) >60 mL/min   Anion gap 11 5 - 15  HIV Antibody (routine testing w rflx)     Status: None   Collection Time: 10/12/23  5:12 AM  Result Value Ref Range   HIV Screen 4th Generation wRfx Non Reactive Non Reactive  Protime-INR     Status: Abnormal   Collection Time: 10/12/23  5:12 AM  Result Value Ref Range   Prothrombin  Time 19.4 (H) 11.4 - 15.2 seconds   INR 1.6 (H) 0.8 - 1.2  Glucose, capillary     Status: Abnormal   Collection Time: 10/12/23  7:51 AM  Result Value Ref Range   Glucose-Capillary 66 (L) 70 - 99 mg/dL  Lipid panel     Status: Abnormal   Collection Time: 10/12/23  8:45 AM  Result Value Ref Range   Cholesterol 182 0 - 200 mg/dL   Triglycerides 096 <045 mg/dL   HDL 50 >40 mg/dL   Total CHOL/HDL Ratio 3.6 RATIO   VLDL 24 0 - 40 mg/dL   LDL Cholesterol 981 (H) 0 - 99 mg/dL  Hemoglobin X9J     Status: None   Collection Time: 10/12/23  8:45 AM  Result Value Ref Range   Hgb A1c MFr Bld 4.8 4.8 - 5.6 %   Mean Plasma Glucose 91.06 mg/dL  Glucose, capillary     Status: None   Collection Time: 10/12/23 12:12 PM  Result Value Ref Range   Glucose-Capillary 74 70 - 99 mg/dL    I have reviewed pertinent nursing notes, vitals, labs, and images as necessary. I have ordered labwork to follow up on as indicated.  I have reviewed the last notes from staff over past 24 hours. I have discussed patient's care plan and test results with nursing staff, CM/SW, and  other staff as appropriate.  Time spent: Greater than 50% of the 55 minute visit was spent in counseling/coordination of care for the patient as laid out in the A&P.   LOS: 1 day   Faith Homes, MD [facility] 10/12/2023, 3:48 PM

## 2023-10-12 NOTE — Progress Notes (Addendum)
 STROKE TEAM PROGRESS NOTE    INTERIM HISTORY/SUBJECTIVE Patient is awake and alert sitting up in bed in no apparent distress.  No family at the bedside States that yesterday he had a headache and his his eyes became itchy feeling and he lost vision in the top of his left eye Mri brain with no acute process  He was seen by Dr. Lacy Pick ophthalmologist diagnosed him with retinal artery branch occlusion in the left eye Will check ANA and cardiolipin antibodies . Will get CTA head and neck   CBC    Component Value Date/Time   WBC 5.3 10/12/2023 0512   RBC 3.78 (L) 10/12/2023 0512   HGB 11.2 (L) 10/12/2023 0512   HGB 11.4 (L) 07/05/2023 0000   HCT 34.8 (L) 10/12/2023 0512   HCT 35.0 (L) 07/05/2023 0000   PLT 233 10/12/2023 0512   PLT 294 07/05/2023 0000   MCV 92.1 10/12/2023 0512   MCV 92 07/05/2023 0000   MCH 29.6 10/12/2023 0512   MCHC 32.2 10/12/2023 0512   RDW 16.9 (H) 10/12/2023 0512   RDW 16.6 (H) 07/05/2023 0000   LYMPHSABS 1.9 07/12/2023 1135   LYMPHSABS 1.5 07/05/2023 0000   MONOABS 0.8 07/12/2023 1135   EOSABS 0.3 07/12/2023 1135   EOSABS 0.2 07/05/2023 0000   BASOSABS 0.1 07/12/2023 1135   BASOSABS 0.0 07/05/2023 0000    BMET    Component Value Date/Time   NA 135 10/12/2023 0512   NA 137 03/03/2023 1242   K 6.2 (H) 10/12/2023 0512   CL 96 (L) 10/12/2023 0512   CO2 28 10/12/2023 0512   GLUCOSE 95 10/12/2023 0512   BUN 30 (H) 10/12/2023 0512   BUN 19 03/03/2023 1242   CREATININE 9.14 (H) 10/12/2023 0512   CALCIUM  8.5 (L) 10/12/2023 0512   EGFR 8 (L) 03/03/2023 1242   GFRNONAA 6 (L) 10/12/2023 0512    IMAGING past 24 hours MR BRAIN WO CONTRAST Result Date: 10/12/2023 CLINICAL DATA:  Neuro deficit with stroke suspected. Chronic cervical myelopathy. EXAM: MRI HEAD WITHOUT CONTRAST MRI CERVICAL SPINE WITHOUT CONTRAST TECHNIQUE: Multiplanar, multiecho pulse sequences of the brain and surrounding structures, and cervical spine, to include the craniocervical  junction and cervicothoracic junction, were obtained without intravenous contrast. COMPARISON:  Brain MRI 04/08/2021 FINDINGS: MRI HEAD FINDINGS Brain: No acute infarction, hemorrhage, hydrocephalus, extra-axial collection or mass lesion. Small FLAIR hyperintensities scattered in the cerebral white matter attributed to chronic small vessel ischemia with lacunes. Chronic lacunar infarcts in the bilateral pons. Mild for age cerebral volume loss which is generalized. Vascular: Normal flow voids. Skull and upper cervical spine: Normal marrow signal Sinuses/Orbits: Unremarkable MRI CERVICAL SPINE FINDINGS Alignment: Reversal of cervical lordosis. Vertebrae: No fracture, evidence of discitis, or bone lesion. Fatty degenerative marrow conversion at multiple endplates. Cord: Normal signal and morphology when allowing for motion artifact Posterior Fossa, vertebral arteries, paraspinal tissues: No perispinal mass or inflammation Disc levels: C2-3: Degenerative facet spurring and mild disc narrowing. No neural impingement C3-4: Disc collapse with endplate and uncovertebral ridging. Degenerative facet spurring which is moderate on the left. At least moderate left foraminal narrowing C4-5: Disc collapse with endplate and uncovertebral ridging. Estimate moderate bilateral foraminal narrowing. Patent spinal canal. C5-6: Disc narrowing and endplate degeneration with uncovertebral and facet spurring. Estimate mild spinal stenosis and mild bilateral foraminal narrowing. C6-7: Disc narrowing and bulging with endplate and uncovertebral ridging. There is facet spurring and ligamentum flavum thickening. Estimate moderate spinal stenosis with midline canal diameter of 7 mm. Advanced  right foraminal stenosis. C7-T1:Degenerative facet spurring to a moderate degree. Mild disc bulging. IMPRESSION: Brain MRI: No acute finding. 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 Cervical spine: Moderate motion artifact. Generalized  degeneration with up to moderate spinal stenosis at C6-7. Multilevel foraminal narrowing which appears focally advanced at C6-7 on the right. Electronically Signed   By: Ronnette Coke M.D.   On: 10/12/2023 05:10   MR CERVICAL SPINE WO CONTRAST Result Date: 10/12/2023 CLINICAL DATA:  Neuro deficit with stroke suspected. Chronic cervical myelopathy. EXAM: MRI HEAD WITHOUT CONTRAST MRI CERVICAL SPINE WITHOUT CONTRAST TECHNIQUE: Multiplanar, multiecho pulse sequences of the brain and surrounding structures, and cervical spine, to include the craniocervical junction and cervicothoracic junction, were obtained without intravenous contrast. COMPARISON:  Brain MRI 04/08/2021 FINDINGS: MRI HEAD FINDINGS Brain: No acute infarction, hemorrhage, hydrocephalus, extra-axial collection or mass lesion. Small FLAIR hyperintensities scattered in the cerebral white matter attributed to chronic small vessel ischemia with lacunes. Chronic lacunar infarcts in the bilateral pons. Mild for age cerebral volume loss which is generalized. Vascular: Normal flow voids. Skull and upper cervical spine: Normal marrow signal Sinuses/Orbits: Unremarkable MRI CERVICAL SPINE FINDINGS Alignment: Reversal of cervical lordosis. Vertebrae: No fracture, evidence of discitis, or bone lesion. Fatty degenerative marrow conversion at multiple endplates. Cord: Normal signal and morphology when allowing for motion artifact Posterior Fossa, vertebral arteries, paraspinal tissues: No perispinal mass or inflammation Disc levels: C2-3: Degenerative facet spurring and mild disc narrowing. No neural impingement C3-4: Disc collapse with endplate and uncovertebral ridging. Degenerative facet spurring which is moderate on the left. At least moderate left foraminal narrowing C4-5: Disc collapse with endplate and uncovertebral ridging. Estimate moderate bilateral foraminal narrowing. Patent spinal canal. C5-6: Disc narrowing and endplate degeneration with  uncovertebral and facet spurring. Estimate mild spinal stenosis and mild bilateral foraminal narrowing. C6-7: Disc narrowing and bulging with endplate and uncovertebral ridging. There is facet spurring and ligamentum flavum thickening. Estimate moderate spinal stenosis with midline canal diameter of 7 mm. Advanced right foraminal stenosis. C7-T1:Degenerative facet spurring to a moderate degree. Mild disc bulging. IMPRESSION: Brain MRI: No acute finding. 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 Cervical spine: Moderate motion artifact. Generalized degeneration with up to moderate spinal stenosis at C6-7. Multilevel foraminal narrowing which appears focally advanced at C6-7 on the right. Electronically Signed   By: Ronnette Coke M.D.   On: 10/12/2023 05:10    Vitals:   10/12/23 0219 10/12/23 0340 10/12/23 0741 10/12/23 1213  BP: (!) 114/46 132/77 128/79 139/82  Pulse: 85 89 87 79  Resp: 20 20 17 17   Temp: 98.9 F (37.2 C) 98.1 F (36.7 C) 98.6 F (37 C) 97.7 F (36.5 C)  TempSrc: Oral Oral Oral Oral  SpO2: 96% 97% 100% 100%  Weight:  105.6 kg    Height:         PHYSICAL EXAM General:  Alert, well-nourished, well-developed patient in no acute distress Psych:  Mood and affect appropriate for situation CV: Regular rate and rhythm on monitor Respiratory:  Regular, unlabored respirations on room air GI: Abdomen soft and nontender   NEURO:  Mental Status: AA&Ox3, patient is able to give clear and coherent history Speech/Language: speech is without dysarthria or aphasia.  Naming, repetition, fluency, and comprehension intact.  Cranial Nerves:  II: PERRL. 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 Eyelids elevate symmetrically.  V: Sensation is intact to light touch and symmetrical to face.  VII: Face is symmetrical  resting and smiling VIII: hearing intact to voice. IX, X: Palate elevates symmetrically. Phonation is normal.  RU:[id number]  shrug 5/5. XII: tongue is midline without fasciculations. Motor: 5/5 strength in bilateral uppers and left leg, right leg 4/5 with more proximal weakness than distal weakness  Tone: is normal and bulk is normal Sensation- decreased on left leg Extinction absent to light touch to DSS.   Coordination: FTN intact bilaterally, HKS: no ataxia in BLE.No drift.  Gait- deferred   ASSESSMENT/PLAN  [name] is a 63 y.o. male with history of   a flutter on coumadin , , DVT, gout, HTN, DM2, neuropathy, CHF, cardiomyopathy, hx of renal cell carcinoma, ESRD on HD, CAD, GERD who woke up on Saturday morning with a small ark spot with no vision in the center of his left eye. Around mid day, the left eye vision is blurry.   NIH on Admission 1  Left central retinal artery branch occlusion  etiology:   Cardioembolic from a flutter despite anticoagulation  CT head No acute abnormality. Small vessel disease. Atrophy. chronic lacunar infarcts within the left lentiform nucleus and adjacent white matter, and within the pons. CTA head & neck ordered  MRI  no acute process 2D Echo ordered  LDL 108 HgbA1c 4.8 VTE prophylaxis - on warfarin  warfarin daily prior to admission, now on warfarin daily  Therapy recommendations:  Pending Disposition:  pending   Hx of Stroke/TIA 2012 chronic lacunar infarcts within the left lentiform nucleus and adjacent white matter, and within the pons.  Atrial Flutter  Home Meds: Coumadin , digoxin  0.125mg  Continue telemetry monitoring Continue Coumadin    Hypotension Home meds:   midodrine   Stable Blood Pressure Goal: SBP less than 160   Hyperlipidemia CAD Cardiomyopathy Home meds:  none LDL 108, goal < 70 Add atorvastatin  40mg    Continue statin at discharge  Diabetes type II Controlled Home meds:  none HgbA1c 4.8, goal < 7.0 CBGs SSI Recommend close follow-up with PCP for better DM control   Dysphagia Patient has post-stroke dysphagia, SLP consulted     Diet   Diet renal/carb modified with fluid restriction Diet-HS Snack? Nothing; Fluid restriction: 1200 mL Fluid; Room service appropriate? Yes; Fluid consistency: Thin   Advance diet as tolerated  Other Stroke Risk Factors Obesity, Body mass index is 30.7 kg/m., BMI >/= 30 associated with increased stroke risk, recommend weight loss, diet and exercise as appropriate  Coronary artery disease Congestive heart failure   Other Active Problems   Hospital day # 1   Jonette Nestle DNP, ACNPC-AG  [facility]  I have personally obtained history,examined this patient, reviewed notes, independently viewed imaging studies, participated in medical decision making and plan of care.ROS completed by me personally and pertinent positives fully documented  I have made any additions or clarifications directly to the above note. Agree with note above.  He presented with partial vision loss in the left eye in the upper quadrant mostly due to central retinal artery branch occlusion.  Continue ongoing stroke workup for restratification.  Check CT angiogram of the brain and neck, echocardiogram, ESR and ANA panel.  Recommend continue Xarelto for stroke prevention given history of atrial flutter.  Aggressive risk factor modification.  Outpatient follow-up with ophthalmology for vision loss.  Greater than 50% time during this 50-minute visit was spent on counseling coordination of care and discussion about his vision loss stroke risk and answering questions Ardella Beaver, MD Medical Director [facility] Pager: [phone] 10/12/2023 3:49 PM  To contact Stroke Continuity provider, please refer to [email or web address]. After hours, contact General Neurology

## 2023-10-12 NOTE — Assessment & Plan Note (Signed)
-   Uses walker at times but also depends on wheelchair - He remains at his baseline

## 2023-10-12 NOTE — Assessment & Plan Note (Signed)
 Nephrology consulted  Continue dialysis per nephrology

## 2023-10-12 NOTE — Progress Notes (Signed)
*  PRELIMINARY RESULTS* Echocardiogram 2D Echocardiogram has been performed.  [name] 10/12/2023, 3:59 PM

## 2023-10-12 NOTE — Hospital Course (Signed)
 [name] is a 63 yo male with PMH ESRD on HD, chronic right eye blurry vision, CAD, DMII, diverticulitis, DVT, GERD, HTN, gout who presented with left eye vision changes.  He had noticed vision changes occurring upon waking up on 10/08/2023. Due to no improvement over the past few days, he finally presented to the ER for further evaluation. He was admitted for further stroke workup.

## 2023-10-12 NOTE — Assessment & Plan Note (Addendum)
-   LDL 108 - No statin noted on med rec - Started on Lipitor 40 mg per neurology

## 2023-10-12 NOTE — Assessment & Plan Note (Signed)
-   Elevated 10/12/2023 -Lokelma  given per nephrology and planning for dialysis as well

## 2023-10-12 NOTE — Progress Notes (Signed)
 PHARMACY - ANTICOAGULATION CONSULT NOTE  Pharmacy Consult for Warfarin  Indication: atrial fibrillation  Allergies  Allergen Reactions   Ace Inhibitors Itching, Cough and Other (See Comments)    Patient Measurements: Height: 6\' 1"  (185.4 cm) Weight: 105.6 kg (232 lb 11.2 oz) IBW/kg (Calculated) : 79.9 HEPARIN  DW (KG): 101.6  Vital Signs: Temp: 97.7 F (36.5 C) (05/21 1213) Temp Source: Oral (05/21 1213) BP: 139/82 (05/21 1213) Pulse Rate: 79 (05/21 1213)  Labs: Recent Labs    10/11/23 1225 10/12/23 0512  HGB 11.6* 11.2*  HCT 36.9* 34.8*  PLT 218 233  LABPROT 20.4* 19.4*  INR 1.7* 1.6*  CREATININE 7.66* 9.14*    Estimated Creatinine Clearance: 11 mL/min (A) (by C-G formula based on SCr of 9.14 mg/dL (H)).   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 Sharyn Deforest follows   Diabetes mellitus    pt. states he's borderline diabetic., no longer taking med,- off med. since 2013   Dialysis patient [facility])    Mon-Wed-Fri([facility])- Left AV fistula   Diverticulitis 03/2015   reoccurred in December 2016   Diverticulitis of intestine without perforation or abscess without bleeding 04/21/2015   Dizziness 11/22/2018   DVT (deep venous thrombosis) (HCC)    ESRD (end stage renal disease) (HCC)    GERD (gastroesophageal reflux disease)    Gout    Hemorrhage of left kidney 01/13/2018   History of nephrectomy 07/04/2012   History of right nephrectomy in 2002 for renal cell  carcinoma    History of unilateral nephrectomy    Hypertension    Hypotension due to hypovolemia 06/04/2020   Low iron     Myocardial infarction [facility]) ?2006   Renal cell carcinoma    dialysis- [facility]- [address]- Dr. Harry Lindau follows.   Renal insufficiency    Shortness of breath 05/19/2011   "at rest, lying down, w/exertion"   Stroke [facility]) 02/2011   05/19/11 denies residual   Umbilical hernia 05/19/2011   unrepaired   Wears glasses     Medications:  Medications Prior to Admission  Medication Sig Dispense Refill Last Dose/Taking   acetaminophen  (TYLENOL ) 500 MG tablet Take 500 mg by mouth as needed.   10/10/2023   albuterol  (VENTOLIN  HFA) 108 (90 Base) MCG/ACT inhaler Inhale 2 puffs into the lungs every 6 (six) hours as needed for wheezing or shortness of breath. 18 g 2 Taking As Needed   calcium  acetate (PHOSLO ) 667 MG capsule Take 1-3 capsules (667-2,001 mg total) by mouth See admin instructions. Take 2001 mg capsules with meals three times daily and 667 mg capsule with snacks.   10/10/2023   digoxin  (LANOXIN ) 0.125 MG tablet Take 1 tablet (0.125 mg total) by mouth every Monday, Wednesday, and Friday. 30 tablet 1 10/10/2023   gabapentin  (NEURONTIN ) 300 MG capsule Take 1 capsule (300 mg total) by mouth at bedtime. 90 capsule 1 10/10/2023   loperamide  (IMODIUM  A-D) 2 MG tablet Take 1 tablet (2 mg total) by mouth 4 (four)  times daily as needed for diarrhea or loose stools. Also available OTC. 30 tablet 0 10/10/2023   midodrine  (PROAMATINE ) 10 MG tablet Take 1 tablet (10 mg total) by mouth 3 (three) times daily. 90 tablet 3 10/10/2023   pantoprazole  (PROTONIX ) 40 MG tablet Take 40 mg by mouth 2 (two) times daily.   Past Week   warfarin (COUMADIN ) 7.5 MG tablet Take 7.5 mg by mouth daily.   10/10/2023   Control Gel Formula Dressing (DUODERM CGF EXTRA THIN) MISC Apply 1 each topically daily as needed. (Patient not taking: Reported on 10/11/2023) 10 each 2 Not Taking   glucose blood test strip Use  to check blood sugar 3 times a day. Dx code e11.65 100 each 12    HYDROcodone -acetaminophen  (NORCO) 7.5-325 MG tablet Take 1 tablet by mouth every 6 (six) hours as needed for moderate pain. (Patient not taking: Reported on 10/11/2023) 28 tablet 0 Not Taking   Lactobacillus (ACIDOPHILUS/BIFIDUS) WAFR Take 1 Wafer by mouth daily. (Patient not taking: Reported on 10/11/2023) 90 Wafer 1 Not Taking   Lancets (ONETOUCH DELICA PLUS LANCET33G) MISC Use to check blood sugar 3 times a day. Dx code e11.65 100 each 3    lidocaine  (LIDODERM ) 5 % Place 1 patch onto the skin daily. Remove & Discard patch within 12 hours or as directed by MD (Patient not taking: Reported on 10/11/2023) 30 patch 0 Not Taking   meclizine  (ANTIVERT ) 25 MG tablet Take 1 tablet (25 mg total) by mouth 3 (three) times daily as needed for dizziness. (Patient not taking: Reported on 10/11/2023) 30 tablet 0 Not Taking   metoprolol  succinate (TOPROL -XL) 25 MG 24 hr tablet Take 1 tablet (25 mg total) by mouth daily. (Patient not taking: Reported on 10/11/2023) 30 tablet 3 Not Taking   ondansetron  (ZOFRAN  ODT) 4 MG disintegrating tablet Take 1 tablet (4 mg total) by mouth every 8 (eight) hours as needed for nausea or vomiting. (Patient not taking: Reported on 10/11/2023) 20 tablet 0 Not Taking   ondansetron  (ZOFRAN ) 4 MG tablet Take 1 tablet (4 mg total) by mouth every 6 (six) hours. (Patient not taking: Reported on 10/11/2023) 12 tablet 0 Not Taking   oxyCODONE  (ROXICODONE ) 5 MG immediate release tablet Take 1 tablet (5 mg total) by mouth every 6 (six) hours as needed for moderate pain (pain score 4-6) or severe pain (pain score 7-10). (Patient not taking: Reported on 10/11/2023) 30 tablet 0 Not Taking   UNABLE TO FIND Out patient physical therapy- vestibular therapy for BPPV 1 each 0     Assessment: Admitted 10/11/23 for CVA workup due to complaint of  black spot in left eye, vision loss.  5/20 CT head:  no acute intracranial hemorrhage, No acute  intracranial finding.  Unchanged chronic lacunar infarcts within the left lentiform nucleus and adjacent white matter, and within the pons.  Pharmacy consult to dose  Warfarin for  nonvalvular Afib. Patient taking warfarin PTA- Confirmed with wife (she had bottle in hand) and fill history that patient was taking 10 mg tablets daily.   5/21: INR down to 1.6. Patient did not have 10 mg dose administered yesterday as ordered. CBC stable.   Goal of Therapy:  INR 2-3 Monitor platelets by anticoagulation protocol: Yes   Plan:  Give  Warfarin 10mg  x1 tonight.  Monitor daily protime/INR  Chrystie Crass, PharmD Clinical Pharmacist  10/12/2023 12:23 PM

## 2023-10-12 NOTE — Consult Note (Signed)
 Reason for consult:  vision loss OVER ONE WEEK AGO.  Ophthalmology paged at 5:15 am for an inpatient consult  HPI: [name] is an 63 y.o. male.  He has many systemic issues including diabetes, renal cell carcinoma and hypertension.  He is on dialysis 3 x a week. He had painless vision loss OS over one week ago.  He reports he is missing vision in the upper half of his vision OS.  He reports no diplopia.  No changes in vision OD.  No headache.  He has already had a CT brain and MRI of the brain since going to ER for this vision loss.  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 Sharyn Deforest follows   Diabetes mellitus    pt. states he's borderline diabetic., no longer taking med,- off med. since 2013   Dialysis patient [facility])    Mon-Wed-Fri([facility])- Left AV fistula   Diverticulitis 03/2015   reoccurred in December 2016   Diverticulitis of intestine without perforation or abscess without bleeding 04/21/2015   Dizziness 11/22/2018   DVT (deep venous thrombosis) (HCC)    ESRD (end stage renal disease) (HCC)    GERD (gastroesophageal reflux disease)    Gout    Hemorrhage of left kidney 01/13/2018   History of nephrectomy 07/04/2012   History of right nephrectomy in 2002 for renal cell carcinoma    History of unilateral nephrectomy    Hypertension    Hypotension due to hypovolemia 06/04/2020   Low iron     Myocardial infarction [facility]) ?2006   Renal cell carcinoma    dialysis- MWF- [address]- Dr.  Harry Lindau follows.   Renal insufficiency    Shortness of breath 05/19/2011   "at rest, lying down, w/exertion"   Stroke [facility]) 02/2011   05/19/11 denies residual   Umbilical hernia 05/19/2011   unrepaired   Wears glasses    Past Surgical History:  Procedure Laterality Date   A/V FISTULAGRAM N/A 06/30/2023   Procedure: A/V Fistulagram;  Surgeon: Patrick Boor, MD;  Location: [facility] INVASIVE CV LAB;  Service: Cardiovascular;  Laterality: N/A;   ANAL FISTULOTOMY  08/15/2018   AV FISTULA PLACEMENT  03/29/2011   Procedure: ARTERIOVENOUS (AV) FISTULA CREATION;  Surgeon: Arleta Lade, MD;  Location: [facility] OR;  Service: Vascular;  Laterality: Left;  LEFT RADIOCEPHALIC , Arteriovenous [phone])   CARDIAC CATHETERIZATION  05/21/11   CARDIAC CATHETERIZATION N/A 03/16/2016   Procedure: Left Heart Cath and Coronary Angiography;  Surgeon: Pasqual Bone, MD;  Location: [facility] INVASIVE CV LAB;  Service: Cardiovascular;  Laterality: N/A;   CHOLECYSTECTOMY N/A 02/12/2016   Procedure: LAPAROSCOPIC CHOLECYSTECTOMY WITH INTRAOPERATIVE CHOLANGIOGRAM;  Surgeon: Adalberto Hollow, MD;  Location: [facility] OR;  Service: General;  Laterality: N/A;   COLONOSCOPY WITH PROPOFOL  N/A 02/01/2017   Procedure: COLONOSCOPY WITH PROPOFOL ;  Surgeon: Tami Falcon, MD;  Location: [facility] ENDOSCOPY;  Service: Endoscopy;  Laterality: N/A;   dialysis cath placed     ESOPHAGOGASTRODUODENOSCOPY (EGD) WITH PROPOFOL  N/A 12/02/2015  Procedure: ESOPHAGOGASTRODUODENOSCOPY (EGD) WITH PROPOFOL ;  Surgeon: Tami Falcon, MD;  Location: [facility] ENDOSCOPY;  Service: Endoscopy;  Laterality: N/A;   EVALUATION UNDER ANESTHESIA WITH HEMORRHOIDECTOMY N/A 08/15/2018   Procedure: HEMORRHOID LIGATION/PEXY;  Surgeon: Candyce Champagne, MD;  Location: [facility] OR;  Service: General;  Laterality: N/A;   FINGER SURGERY     L pinkie finger- ORIF- /w remaining hardware - 1990's     FISTULOTOMY N/A 08/15/2018   Procedure: SUPERFICIAL ANAL FISTULOTOMY;  Surgeon: Candyce Champagne, MD;  Location:  [facility] OR;  Service: General;  Laterality: N/A;   HEMORRHOID SURGERY     HERNIA REPAIR N/A 07/04/2012   Umbilical hernia repair   INSERTION OF DIALYSIS CATHETER N/A 01/27/2016   Procedure: INSERTION OF DIALYSIS CATHETER;  Surgeon: Adine Hoof, MD;  Location: [facility] OR;  Service: Vascular;  Laterality: N/A;   INSERTION OF MESH N/A 07/04/2012   Procedure: INSERTION OF MESH;  Surgeon: Evander Hills, DO;  Location: [facility] OR;  Service: General;  Laterality: N/A;   IR EMBO ART  VEN HEMORR LYMPH EXTRAV  INC GUIDE ROADMAPPING  01/13/2018   IR FLUORO GUIDE CV LINE RIGHT  01/13/2018   IR IVC FILTER PLMT / S&I /IMG GUID/MOD SED  01/27/2018   IR RENAL SELECTIVE  UNI INC S&I MOD SED  01/13/2018   IR US  GUIDE VASC ACCESS RIGHT  01/13/2018   KIDNEY CYST REMOVAL  2019   LAPAROSCOPIC LYSIS OF ADHESIONS N/A 02/12/2016   Procedure: LAPAROSCOPIC LYSIS OF ADHESIONS;  Surgeon: Adalberto Hollow, MD;  Location: [facility] OR;  Service: General;  Laterality: N/A;   LEFT AND RIGHT HEART CATHETERIZATION WITH CORONARY ANGIOGRAM N/A 08/04/2012   Procedure: LEFT AND RIGHT HEART CATHETERIZATION WITH CORONARY ANGIOGRAM;  Surgeon: Darrold Emms, MD;  [facility]: [facility] CATH LAB;  Service: Cardiovascular;  Laterality: N/A;   NEPHRECTOMY  2000   right   PERIPHERAL VASCULAR BALLOON ANGIOPLASTY Left 06/30/2023   Procedure: PERIPHERAL VASCULAR BALLOON ANGIOPLASTY;  Surgeon: Patrick Boor, MD;  [facility]: [facility] INVASIVE CV LAB;  Service: Cardiovascular;  Laterality: Left;  60% cephalic vein   REVERSE SHOULDER ARTHROPLASTY Right 09/20/2022   Procedure: REVERSE SHOULDER ARTHROPLASTY;  Surgeon: Janeth Medicus, MD;  Location[facility] OR;  Service: Orthopedics;  Laterality: Right;  120   REVERSE SHOULDER ARTHROPLASTY Left 03/18/2023   Procedure: REVERSE SHOULDER ARTHROPLASTY;  Surgeon: Janeth Medicus, MD;  Location: [facility] OR;  Service: Orthopedics;  Laterality: Left;  120   REVISON OF ARTERIOVENOUS FISTULA Left 06/05/2013   Procedure: REVISON OF LEFT  RADIOCEPHALIC  ARTERIOVENOUS FISTULA;  Surgeon: Arvil Lauber, MD;  Location: [facility] OR;  Service: Vascular;  Laterality: Left;   REVISON OF ARTERIOVENOUS FISTULA Left 01/27/2016   Procedure: REVISION OF LEFT UPPER EXTREMITY ARTERIOVENOUS FISTULA;  Surgeon: Adine Hoof, MD;  Location: [facility] OR;  Service: Vascular;  Laterality: Left;   REVISON OF ARTERIOVENOUS FISTULA Left 08/03/2016   Procedure: REVISON OF Left arm ARTERIOVENOUS FISTULA;  Surgeon: Richrd Char, MD;  Location: [facility] OR;  Service: Vascular;  Laterality: Left;   REVISON OF ARTERIOVENOUS FISTULA Left 05/06/2017   Procedure: REVISION PLICATION OF ARTERIOVENOUS FISTULA LEFT ARM;  Surgeon: Mayo Speck, MD;  [facility] OR;  Service: Vascular;  Laterality: Left;   REVISON OF ARTERIOVENOUS FISTULA Left 02/01/2019   Procedure: REVISION OF ARTERIOVENOUS FISTULA LEFT ARM;  Surgeon: Young Hensen, MD;  Location: [facility] OR;  Service: Vascular;  Laterality: Left;   REVISON OF ARTERIOVENOUS FISTULA Left 03/15/2019   Procedure: REVISION PLICATION OF  ARTERIOVENOUS FISTULA LEFT ARM;  Surgeon: Young Hensen, MD;  Location: [facility] OR;  Service: Vascular;  Laterality: Left;   RIGHT HEART CATHETERIZATION N/A 05/21/2011   Procedure: RIGHT HEART CATH;  Surgeon: Darrold Emms, MD;  Location: [facility] CATH LAB;  Service: Cardiovascular;  Laterality: N/A;   smashed  1990's   "left pinky; have a plate in there"   UMBILICAL HERNIA REPAIR N/A 07/04/2012   Procedure: LAPAROSCOPIC UMBILICAL HERNIA;  Surgeon: Evander Hills, DO;  Location: [facility] OR;  Service: General;  Laterality: N/A;   US  ECHOCARDIOGRAPHY  05/20/11   Family History  Problem Relation Age of Onset   Hypertension Mother    Kidney disease Mother    Current Facility-Administered Medications  Medication Dose Route Frequency Provider Last Rate Last Admin   acetaminophen  (TYLENOL ) tablet 650 mg  650 mg Oral Q6H PRN Reesa Cannon N, DO   650 mg at 10/12/23 0354   digoxin  (LANOXIN ) tablet 0.125 mg   0.125 mg Oral Q M,W,F Hall, Carole N, DO       gabapentin  (NEURONTIN ) capsule 300 mg  300 mg Oral QHS Hall, Carole N, DO       LORazepam  (ATIVAN ) injection 0.5 mg  0.5 mg Intravenous Q6H PRN Reesa Cannon N, DO   0.5 mg at 10/12/23 6213   melatonin tablet 5 mg  5 mg Oral QHS PRN Bary Boss, DO       midodrine  (PROAMATINE ) tablet 10 mg  10 mg Oral TID WC Hall, Carole N, DO       polyethylene glycol (MIRALAX  / GLYCOLAX ) packet 17 g  17 g Oral Daily PRN Reesa Cannon N, DO       prochlorperazine  (COMPAZINE ) injection 5 mg  5 mg Intravenous Q6H PRN Reesa Cannon N, DO       warfarin (COUMADIN ) tablet 10 mg  10 mg Oral ONCE-1600 Yvonne Hering, [facility]       Warfarin - Pharmacist Dosing Inpatient   Does not apply q1600 Yvonne Hering, [facility]   Given at 10/11/23 2245   Allergies  Allergen Reactions   Ace Inhibitors Itching, Cough and Other (See Comments)   Social History   Socioeconomic History   Marital status: Married    Spouse name: Not on file   Number of children: Not on file   Years of education: Not on file   Highest education level: Not on file  Occupational History   Occupation: disability  Tobacco Use   Smoking status: Never   Smokeless tobacco: Never  Vaping Use   Vaping status: Never Used  Substance and Sexual Activity   Alcohol use: No    Alcohol/week: 0.0 standard drinks of alcohol   Drug use: Yes    Types: Hydrocodone     Comment: Past hx-none in 20 yrs   Sexual activity: Yes  Other Topics Concern   Not on file  Social History Narrative   Not on file   Social Drivers of Health   Financial Resource Strain: Low Risk  (01/19/2023)   Overall Financial Resource Strain (CARDIA)    Difficulty of Paying Living Expenses: Not hard at all  Food Insecurity: No Food Insecurity (10/11/2023)   Hunger Vital Sign    Worried About Running Out of Food in the Last Year: Never true    Ran Out of Food in the Last Year: Never true  Transportation Needs: No Transportation Needs (10/11/2023)    PRAPARE - Administrator, Civil Service (Medical): No  Lack of Transportation (Non-Medical): No  Physical Activity: Inactive (01/19/2023)   Exercise Vital Sign    Days of Exercise per Week: 0 days    Minutes of Exercise per Session: 0 min  Stress: No Stress Concern Present (01/19/2023)   [employer] of Occupational Health - Occupational Stress Questionnaire    Feeling of Stress : Not at all  Social Connections: Moderately Integrated (01/19/2023)   Social Connection and Isolation Panel [NHANES]    Frequency of Communication with Friends and Family: More than three times a week    Frequency of Social Gatherings with Friends and Family: Once a week    Attends Religious Services: More than 4 times per year    Active Member of [employer] or Organizations: No    Attends Banker Meetings: Never    Marital Status: Married  Catering manager Violence: Not At Risk (10/11/2023)   Humiliation, Afraid, Rape, and Kick questionnaire    Fear of Current or Ex-Partner: No    Emotionally Abused: No    Physically Abused: No    Sexually Abused: No    Review of systems: Review of Systems  Constitutional: Negative.   HENT: Negative.    Eyes:  Positive for blurred vision.  Respiratory: Negative.    Cardiovascular: Negative.   Gastrointestinal: Negative.   Genitourinary:  Negative for hematuria.  Musculoskeletal: Negative.   Skin: Negative.   Neurological: Negative.   Endo/Heme/Allergies:  Bruises/bleeds easily.  Psychiatric/Behavioral: Negative.      Physical Exam:  Blood pressure 132/77, pulse 89, temperature 98.1 F (36.7 C), temperature source Oral, resp. rate 20, height 6\' 1"  (1.854 m), weight 105.6 kg, SpO2 97%.   [place]  (he has no  glasses with him OD: 20/400  OS  20/200  Pupils:  VERY DIFFICULT.  Patient squeezing eyes shut for swinging flashlight test.  Each pupil is reactive, round, and equal in size.  No obvious RADP IOP (T pen)  OD 22   OS   42m (patient is  SQUEEZING HARD)  CVF: OD full to CF   BM:WUXL of superior half of vision  Motility:  OD full ductions  OS full ductions  Balance/alignment:  Ortho by Susen Epstein   Bedside examination:                                 OD                                       External/adnexa: Normal                                      Lids/lashes:        Normal                                      Conjunctiva        White, quiet        Cornea:              Clear                  AC:  Deep, quiet                                Iris:                     Normal        Lens:                  Nuclear sclerosis                                       OS                                       External/adnexa: Normal                                      Lids/lashes:        Normal                                      Conjunctiva        White, quiet        Cornea:              Clear                  AC:                     Deep, quiet                                Iris:                     Normal        Lens:                  Nuclear Sclerosis     Dilated fundus exam: (Neo 2.5; Myd 1%) 20 Diopter indirect exam      OD Vitreous            Clear, quiet                                Optic Disc:       Normal, perfused                      Macula:             no obvious anomaly                                          Vessels:           no obvious anomaly        Periphery:         Flat, attached  OS Vitreous            Clear, quiet                                Optic Disc:       Normal, perfused                      Macula/ Vessels/Periphery: retinal whitening along inferior temporal arcade involving macula    Labs/studies: Results for orders placed or performed during the hospital encounter of 10/11/23 (from the past 48 hours)  Comprehensive metabolic panel     Status: Abnormal   Collection Time: 10/11/23 12:25 PM  Result Value Ref Range    Sodium 136 135 - 145 mmol/L   Potassium 5.1 3.5 - 5.1 mmol/L   Chloride 94 (L) 98 - 111 mmol/L   CO2 28 22 - 32 mmol/L   Glucose, Bld 94 70 - 99 mg/dL    Comment: Glucose reference range applies only to samples taken after fasting for at least 8 hours.   BUN 25 (H) 6 - 20 mg/dL   Creatinine, Ser 1.61 (H) 0.61 - 1.24 mg/dL   Calcium  9.7 8.9 - 10.3 mg/dL   Total Protein 8.2 (H) 6.5 - 8.1 g/dL   Albumin  3.9 3.5 - 5.0 g/dL   AST 12 (L) 15 - 41 U/L   ALT 6 0 - 44 U/L   Alkaline Phosphatase 116 38 - 126 U/L   Total Bilirubin 0.6 0.0 - 1.2 mg/dL   GFR, Estimated 7 (L) >60 mL/min    Comment: (NOTE) Calculated using the CKD-EPI Creatinine Equation (2021)    Anion gap 14 5 - 15    Comment: Performed at [facility], [address], [place], [place] [address]  Magnesium     Status: None   Collection Time: 10/11/23 12:25 PM  Result Value Ref Range   Magnesium 2.3 1.7 - 2.4 mg/dL    Comment: Performed at [facility], [address] [place], [place] [address]  Phosphorus     Status: Abnormal   Collection Time: 10/11/23 12:25 PM  Result Value Ref Range   Phosphorus 5.3 (H) 2.5 - 4.6 mg/dL    Comment: Performed at [facility], [address], [place], [place] [address]  CBC     Status: Abnormal   Collection Time: 10/11/23 12:25 PM  Result Value Ref Range   WBC 4.9 4.0 - 10.5 K/uL   RBC 3.93 (L) 4.22 - 5.81 MIL/uL   Hemoglobin 11.6 (L) 13.0 - 17.0 g/dL   HCT 78.2 (L) 95.6 - 21.3 %   MCV 93.9 80.0 - 100.0 fL   MCH 29.5 26.0 - 34.0 pg   MCHC 31.4 30.0 - 36.0 g/dL   RDW 08.6 (H) 57.8 - 46.9 %   Platelets 218 150 - 400 K/uL   nRBC 0.0 0.0 - 0.2 %    Comment: Performed at [facility], [address], [place], [place] [address]  Protime-INR     Status: Abnormal   Collection Time: 10/11/23 12:25 PM  Result Value Ref Range   Prothrombin  Time 20.4 (H) 11.4 - 15.2 seconds   INR 1.7 (H) 0.8 - 1.2    Comment:  (NOTE) INR goal varies based on device and disease states. Performed at [facility], [address] [place], [place] [address]   CBG monitoring, ED  Status: None   Collection Time: 10/11/23 12:44 PM  Result Value Ref Range   Glucose-Capillary 81 70 - 99 mg/dL    Comment: Glucose reference range applies only to samples taken after fasting for at least 8 hours.  MRSA Next Gen by PCR, Nasal     Status: None   Collection Time: 10/11/23  9:10 PM   Specimen: Nasal Mucosa; Nasal Swab  Result Value Ref Range   MRSA by PCR Next Gen NOT DETECTED NOT DETECTED    Comment: (NOTE) The GeneXpert MRSA Assay (FDA approved for NASAL specimens only), is one component of a comprehensive MRSA colonization surveillance program. It is not intended to diagnose MRSA infection nor to guide or monitor treatment for MRSA infections. Test performance is not FDA approved in patients less than 69 years old. Performed at [facility], [address] [place], [place] [address]   Glucose, capillary     Status: None   Collection Time: 10/11/23  9:11 PM  Result Value Ref Range   Glucose-Capillary 86 70 - 99 mg/dL    Comment: Glucose reference range applies only to samples taken after fasting for at least 8 hours.  TSH     Status: None   Collection Time: 10/12/23 12:17 AM  Result Value Ref Range   TSH 1.692 0.350 - 4.500 uIU/mL    Comment: Performed by a 3rd Generation assay with a functional sensitivity of <=0.01 uIU/mL. Performed at [facility], [address], [place], [place] [address]   Vitamin B12     Status: None   Collection Time: 10/12/23 12:17 AM  Result Value Ref Range   Vitamin B-12 407 180 - 914 pg/mL    Comment: HEMOLYSIS AT THIS LEVEL MAY AFFECT RESULT (NOTE) This assay is not validated for testing neonatal or myeloproliferative syndrome specimens for Vitamin B12 levels. Performed at [facility], [address], [place], [place] [address]    Folate     Status: Abnormal   Collection Time: 10/12/23 12:17 AM  Result Value Ref Range   Folate 5.8 (L) >5.9 ng/mL    Comment: Performed at [facility], [address], [place], [place] [address]  CBC     Status: Abnormal   Collection Time: 10/12/23  5:12 AM  Result Value Ref Range   WBC 5.3 4.0 - 10.5 K/uL   RBC 3.78 (L) 4.22 - 5.81 MIL/uL   Hemoglobin 11.2 (L) 13.0 - 17.0 g/dL   HCT 29.5 (L) 62.1 - 30.8 %   MCV 92.1 80.0 - 100.0 fL   MCH 29.6 26.0 - 34.0 pg   MCHC 32.2 30.0 - 36.0 g/dL   RDW 65.7 (H) 84.6 - 96.2 %   Platelets 233 150 - 400 K/uL   nRBC 0.0 0.0 - 0.2 %    Comment: Performed at [facility], [address] [place], [place] [address]  Renal function panel     Status: Abnormal   Collection Time: 10/12/23  5:12 AM  Result Value Ref Range   Sodium 135 135 - 145 mmol/L   Potassium 6.2 (H) 3.5 - 5.1 mmol/L   Chloride 96 (L) 98 - 111 mmol/L   CO2 28 22 - 32 mmol/L   Glucose, Bld 95 70 - 99 mg/dL    Comment: Glucose reference range applies only to samples taken after fasting for at least 8 hours.   BUN 30 (H) 6 - 20 mg/dL   Creatinine, Ser 1.32 (  H) 0.61 - 1.24 mg/dL   Calcium  8.5 (L) 8.9 - 10.3 mg/dL   Phosphorus 7.2 (H) 2.5 - 4.6 mg/dL   Albumin  3.1 (L) 3.5 - 5.0 g/dL   GFR, Estimated 6 (L) >60 mL/min    Comment: (NOTE) Calculated using the CKD-EPI Creatinine Equation (2021)    Anion gap 11 5 - 15    Comment: Performed at [facility], [address], [place], [place] [address]  Protime-INR     Status: Abnormal   Collection Time: 10/12/23  5:12 AM  Result Value Ref Range   Prothrombin  Time 19.4 (H) 11.4 - 15.2 seconds   INR 1.6 (H) 0.8 - 1.2    Comment: (NOTE) INR goal varies based on device and disease states. Performed at [facility], [address] [place], [place] [address]    MR BRAIN WO CONTRAST Result Date: 10/12/2023 CLINICAL DATA:  Neuro deficit with stroke suspected. Chronic cervical myelopathy. EXAM: MRI HEAD  WITHOUT CONTRAST MRI CERVICAL SPINE WITHOUT CONTRAST TECHNIQUE: Multiplanar, multiecho pulse sequences of the brain and surrounding structures, and cervical spine, to include the craniocervical junction and cervicothoracic junction, were obtained without intravenous contrast. COMPARISON:  Brain MRI 04/08/2021 FINDINGS: MRI HEAD FINDINGS Brain: No acute infarction, hemorrhage, hydrocephalus, extra-axial collection or mass lesion. Small FLAIR hyperintensities scattered in the cerebral white matter attributed to chronic small vessel ischemia with lacunes. Chronic lacunar infarcts in the bilateral pons. Mild for age cerebral volume loss which is generalized. Vascular: Normal flow voids. Skull and upper cervical spine: Normal marrow signal Sinuses/Orbits: Unremarkable MRI CERVICAL SPINE FINDINGS Alignment: Reversal of cervical lordosis. Vertebrae: No fracture, evidence of discitis, or bone lesion. Fatty degenerative marrow conversion at multiple endplates. Cord: Normal signal and morphology when allowing for motion artifact Posterior Fossa, vertebral arteries, paraspinal tissues: No perispinal mass or inflammation Disc levels: C2-3: Degenerative facet spurring and mild disc narrowing. No neural impingement C3-4: Disc collapse with endplate and uncovertebral ridging. Degenerative facet spurring which is moderate on the left. At least moderate left foraminal narrowing C4-5: Disc collapse with endplate and uncovertebral ridging. Estimate moderate bilateral foraminal narrowing. Patent spinal canal. C5-6: Disc narrowing and endplate degeneration with uncovertebral and facet spurring. Estimate mild spinal stenosis and mild bilateral foraminal narrowing. C6-7: Disc narrowing and bulging with endplate and uncovertebral ridging. There is facet spurring and ligamentum flavum thickening. Estimate moderate spinal stenosis with midline canal diameter of 7 mm. Advanced right foraminal stenosis. C7-T1:Degenerative facet spurring to a  moderate degree. Mild disc bulging. IMPRESSION: Brain MRI: No acute finding. 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 Cervical spine: Moderate motion artifact. Generalized degeneration with up to moderate spinal stenosis at C6-7. Multilevel foraminal narrowing which appears focally advanced at C6-7 on the right. Electronically Signed   By: Ronnette Coke M.D.   On: 10/12/2023 05:10   MR CERVICAL SPINE WO CONTRAST Result Date: 10/12/2023 CLINICAL DATA:  Neuro deficit with stroke suspected. Chronic cervical myelopathy. EXAM: MRI HEAD WITHOUT CONTRAST MRI CERVICAL SPINE WITHOUT CONTRAST TECHNIQUE: Multiplanar, multiecho pulse sequences of the brain and surrounding structures, and cervical spine, to include the craniocervical junction and cervicothoracic junction, were obtained without intravenous contrast. COMPARISON:  Brain MRI 04/08/2021 FINDINGS: MRI HEAD FINDINGS Brain: No acute infarction, hemorrhage, hydrocephalus, extra-axial collection or mass lesion. Small FLAIR hyperintensities scattered in the cerebral white matter attributed to chronic small vessel ischemia with lacunes. Chronic lacunar infarcts in the bilateral pons. Mild for age cerebral volume loss which is generalized. Vascular: Normal  flow voids. Skull and upper cervical spine: Normal marrow signal Sinuses/Orbits: Unremarkable MRI CERVICAL SPINE FINDINGS Alignment: Reversal of cervical lordosis. Vertebrae: No fracture, evidence of discitis, or bone lesion. Fatty degenerative marrow conversion at multiple endplates. Cord: Normal signal and morphology when allowing for motion artifact Posterior Fossa, vertebral arteries, paraspinal tissues: No perispinal mass or inflammation Disc levels: C2-3: Degenerative facet spurring and mild disc narrowing. No neural impingement C3-4: Disc collapse with endplate and uncovertebral ridging. Degenerative facet spurring which is moderate on the left. At least moderate left foraminal narrowing  C4-5: Disc collapse with endplate and uncovertebral ridging. Estimate moderate bilateral foraminal narrowing. Patent spinal canal. C5-6: Disc narrowing and endplate degeneration with uncovertebral and facet spurring. Estimate mild spinal stenosis and mild bilateral foraminal narrowing. C6-7: Disc narrowing and bulging with endplate and uncovertebral ridging. There is facet spurring and ligamentum flavum thickening. Estimate moderate spinal stenosis with midline canal diameter of 7 mm. Advanced right foraminal stenosis. C7-T1:Degenerative facet spurring to a moderate degree. Mild disc bulging. IMPRESSION: Brain MRI: No acute finding. 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 Cervical spine: Moderate motion artifact. Generalized degeneration with up to moderate spinal stenosis at C6-7. Multilevel foraminal narrowing which appears focally advanced at C6-7 on the right. Electronically Signed   By: Ronnette Coke M.D.   On: 10/12/2023 05:10   CT Head Wo Contrast Result Date: 10/11/2023 CLINICAL DATA:  Provided history: Vision loss, monocular. Additional history provided: The patient reports double vision and weakness. EXAM: CT HEAD WITHOUT CONTRAST TECHNIQUE: Contiguous axial images were obtained from the base of the skull through the vertex without intravenous contrast. RADIATION DOSE REDUCTION: This exam was performed according to the departmental dose-optimization program which includes automated exposure control, adjustment of the mA and/or kV according to patient size and/or use of iterative reconstruction technique. COMPARISON:  Brain MRI 04/08/2021. FINDINGS: Brain: Mild generalized cerebral atrophy. Unchanged chronic lacunar infarcts within the left lentiform nucleus/retrolenticular white matter and within the pons. Multifocal T2 FLAIR hyperintense signal abnormality elsewhere within the cerebral white matter, nonspecific but compatible with mild chronic small vessel ischemic disease. There  is no acute intracranial hemorrhage. No demarcated cortical infarct. No extra-axial fluid collection. No evidence of an intracranial mass. No midline shift. Vascular: No hyperdense vessel.  Atherosclerotic calcifications. Skull: No calvarial fracture or aggressive osseous lesion. Sinuses/Orbits: No mass or acute finding within the imaged orbits. No significant paranasal sinus disease. IMPRESSION: 1. No acute intracranial finding. 2. Unchanged chronic lacunar infarcts within the left lentiform nucleus and adjacent white matter, and within the pons. 3. Background mild cerebral white matter chronic small vessel ischemic disease. 4. Mild generalized cerebral atrophy. Electronically Signed   By: Bascom Lily D.O.   On: 10/11/2023 14:29                             Assessment and Plan: Branch retinal artery occlusion OS.  Stroke work-up in progress.  Patient should follow-up with a Retinal specialist as an outpatient.  Not urgent.  Intravitreal injections my help with the retinal swelling, but prognosis very limited.  (There are no retina specialists in my practice, but several in [place])  I told the patient that the best thing he can do is maximize his overall health to prevent another event in his other eye.  He should wear glasses to protect his good eye.  Safety goggles when doing high risk activities.   All of the above information  was relayed to the patient.  All questions were answered.   Tavarius Grewe L 10/12/2023, 6:55 AM  [facility] [phone]

## 2023-10-13 ENCOUNTER — Encounter (HOSPITAL_COMMUNITY): Payer: Self-pay | Admitting: *Deleted

## 2023-10-13 ENCOUNTER — Other Ambulatory Visit (HOSPITAL_COMMUNITY): Payer: Self-pay

## 2023-10-13 ENCOUNTER — Telehealth (HOSPITAL_COMMUNITY): Payer: Self-pay | Admitting: Pharmacy Technician

## 2023-10-13 DIAGNOSIS — E1122 Type 2 diabetes mellitus with diabetic chronic kidney disease: Secondary | ICD-10-CM

## 2023-10-13 DIAGNOSIS — E1151 Type 2 diabetes mellitus with diabetic peripheral angiopathy without gangrene: Secondary | ICD-10-CM | POA: Diagnosis not present

## 2023-10-13 DIAGNOSIS — N186 End stage renal disease: Secondary | ICD-10-CM | POA: Diagnosis not present

## 2023-10-13 DIAGNOSIS — Z992 Dependence on renal dialysis: Secondary | ICD-10-CM | POA: Diagnosis not present

## 2023-10-13 DIAGNOSIS — H34232 Retinal artery branch occlusion, left eye: Principal | ICD-10-CM

## 2023-10-13 DIAGNOSIS — H3412 Central retinal artery occlusion, left eye: Secondary | ICD-10-CM | POA: Diagnosis not present

## 2023-10-13 DIAGNOSIS — I4892 Unspecified atrial flutter: Secondary | ICD-10-CM | POA: Diagnosis not present

## 2023-10-13 DIAGNOSIS — H538 Other visual disturbances: Secondary | ICD-10-CM | POA: Diagnosis not present

## 2023-10-13 LAB — CBC WITH DIFFERENTIAL/PLATELET
Abs Immature Granulocytes: 0.01 10*3/uL (ref 0.00–0.07)
Basophils Absolute: 0 10*3/uL (ref 0.0–0.1)
Basophils Relative: 1 %
Eosinophils Absolute: 0.2 10*3/uL (ref 0.0–0.5)
Eosinophils Relative: 3 %
HCT: 29.9 % — ABNORMAL LOW (ref 39.0–52.0)
Hemoglobin: 9.6 g/dL — ABNORMAL LOW (ref 13.0–17.0)
Immature Granulocytes: 0 %
Lymphocytes Relative: 25 %
Lymphs Abs: 1.2 10*3/uL (ref 0.7–4.0)
MCH: 29.7 pg (ref 26.0–34.0)
MCHC: 32.1 g/dL (ref 30.0–36.0)
MCV: 92.6 fL (ref 80.0–100.0)
Monocytes Absolute: 0.4 10*3/uL (ref 0.1–1.0)
Monocytes Relative: 8 %
Neutro Abs: 2.9 10*3/uL (ref 1.7–7.7)
Neutrophils Relative %: 63 %
Platelets: 220 10*3/uL (ref 150–400)
RBC: 3.23 MIL/uL — ABNORMAL LOW (ref 4.22–5.81)
RDW: 16.6 % — ABNORMAL HIGH (ref 11.5–15.5)
WBC: 4.6 10*3/uL (ref 4.0–10.5)
nRBC: 0 % (ref 0.0–0.2)

## 2023-10-13 LAB — PROTEIN ELECTROPHORESIS, SERUM
A/G Ratio: 0.8 (ref 0.7–1.7)
Albumin ELP: 3.4 g/dL (ref 2.9–4.4)
Alpha-1-Globulin: 0.3 g/dL (ref 0.0–0.4)
Alpha-2-Globulin: 1.2 g/dL — ABNORMAL HIGH (ref 0.4–1.0)
Beta Globulin: 1.1 g/dL (ref 0.7–1.3)
Gamma Globulin: 1.9 g/dL — ABNORMAL HIGH (ref 0.4–1.8)
Globulin, Total: 4.4 g/dL — ABNORMAL HIGH (ref 2.2–3.9)
Total Protein ELP: 7.8 g/dL (ref 6.0–8.5)

## 2023-10-13 LAB — RENAL FUNCTION PANEL
Albumin: 3.3 g/dL — ABNORMAL LOW (ref 3.5–5.0)
Anion gap: 11 (ref 5–15)
BUN: 15 mg/dL (ref 6–20)
CO2: 28 mmol/L (ref 22–32)
Calcium: 9.3 mg/dL (ref 8.9–10.3)
Chloride: 95 mmol/L — ABNORMAL LOW (ref 98–111)
Creatinine, Ser: 4.77 mg/dL — ABNORMAL HIGH (ref 0.61–1.24)
GFR, Estimated: 13 mL/min — ABNORMAL LOW (ref >60)
Glucose, Bld: 95 mg/dL (ref 70–99)
Phosphorus: 4.5 mg/dL (ref 2.5–4.6)
Potassium: 3.9 mmol/L (ref 3.5–5.1)
Sodium: 134 mmol/L — ABNORMAL LOW (ref 135–145)

## 2023-10-13 LAB — HEPATITIS PANEL, ACUTE
HCV Ab: NONREACTIVE
Hep A IgM: NONREACTIVE
Hep B C IgM: NONREACTIVE
Hepatitis B Surface Ag: NONREACTIVE

## 2023-10-13 LAB — ENA+DNA/DS+ANTICH+CENTRO+JO...
Anti JO-1: <0.2 AI (ref 0.0–0.9)
Centromere Ab Screen: <0.2 AI (ref 0.0–0.9)
Chromatin Ab SerPl-aCnc: <0.2 AI (ref 0.0–0.9)
ENA SM Ab Ser-aCnc: <0.2 AI (ref 0.0–0.9)
Ribonucleic Protein: <0.2 AI (ref 0.0–0.9)
SSA (Ro) (ENA) Antibody, IgG: 4.2 AI — ABNORMAL HIGH (ref 0.0–0.9)
SSB (La) (ENA) Antibody, IgG: <0.2 AI (ref 0.0–0.9)
Scleroderma (Scl-70) (ENA) Antibody, IgG: <0.2 AI (ref 0.0–0.9)
ds DNA Ab: <1 [IU]/mL (ref 0–9)

## 2023-10-13 LAB — PROTIME-INR
INR: 1.3 — ABNORMAL HIGH (ref 0.8–1.2)
Prothrombin Time: 16.4 s — ABNORMAL HIGH (ref 11.4–15.2)

## 2023-10-13 LAB — SEDIMENTATION RATE: Sed Rate: 66 mm/h — ABNORMAL HIGH (ref 0–16)

## 2023-10-13 LAB — HEPATITIS B SURFACE ANTIGEN: Hepatitis B Surface Ag: NONREACTIVE

## 2023-10-13 LAB — ANA W/REFLEX IF POSITIVE: Anti Nuclear Antibody (ANA): POSITIVE — AB

## 2023-10-13 LAB — GLUCOSE, CAPILLARY
Glucose-Capillary: 95 mg/dL (ref 70–99)
Glucose-Capillary: 64 mg/dL — ABNORMAL LOW (ref 70–99)

## 2023-10-13 LAB — MAGNESIUM: Magnesium: 2.1 mg/dL (ref 1.7–2.4)

## 2023-10-13 LAB — DIGOXIN LEVEL: Digoxin Level: 0.4 ng/mL — ABNORMAL LOW (ref 0.8–2.0)

## 2023-10-13 MED ORDER — WARFARIN SODIUM 7.5 MG PO TABS: 12.5000 mg | ORAL_TABLET | Freq: Once | ORAL | Status: DC

## 2023-10-13 MED ORDER — ATORVASTATIN CALCIUM 40 MG PO TABS: 40.0000 mg | ORAL_TABLET | Freq: Every day | ORAL | 3 refills | Status: AC

## 2023-10-13 MED ORDER — PANTOPRAZOLE SODIUM 40 MG PO TBEC: 40.0000 mg | DELAYED_RELEASE_TABLET | Freq: Two times a day (BID) | ORAL | Status: DC

## 2023-10-13 NOTE — Progress Notes (Addendum)
 STROKE TEAM PROGRESS NOTE    INTERIM HISTORY/SUBJECTIVE Family at the bedside.  Patient is sitting up in the bed in no apparent distress.  He states his vision is about the same  CBC    Component Value Date/Time   WBC 4.6 10/13/2023 0300   RBC 3.23 (L) 10/13/2023 0300   HGB 9.6 (L) 10/13/2023 0300   HGB 11.4 (L) 07/05/2023 0000   HCT 29.9 (L) 10/13/2023 0300   HCT 35.0 (L) 07/05/2023 0000   PLT 220 10/13/2023 0300   PLT 294 07/05/2023 0000   MCV 92.6 10/13/2023 0300   MCV 92 07/05/2023 0000   MCH 29.7 10/13/2023 0300   MCHC 32.1 10/13/2023 0300   RDW 16.6 (H) 10/13/2023 0300   RDW 16.6 (H) 07/05/2023 0000   LYMPHSABS 1.2 10/13/2023 0300   LYMPHSABS 1.5 07/05/2023 0000   MONOABS 0.4 10/13/2023 0300   EOSABS 0.2 10/13/2023 0300   EOSABS 0.2 07/05/2023 0000   BASOSABS 0.0 10/13/2023 0300   BASOSABS 0.0 07/05/2023 0000    BMET    Component Value Date/Time   NA 134 (L) 10/13/2023 0300   NA 137 03/03/2023 1242   K 3.9 10/13/2023 0300   CL 95 (L) 10/13/2023 0300   CO2 28 10/13/2023 0300   GLUCOSE 95 10/13/2023 0300   BUN 15 10/13/2023 0300   BUN 19 03/03/2023 1242   CREATININE 4.77 (H) 10/13/2023 0300   CALCIUM  9.3 10/13/2023 0300   EGFR 8 (L) 03/03/2023 1242   GFRNONAA 13 (L) 10/13/2023 0300    IMAGING past 24 hours CT ANGIO HEAD NECK W WO CM Result Date: 10/12/2023 EXAM: CTA Head and Neck with Intravenous Contrast. CT Head without Contrast. CLINICAL HISTORY: Stroke/TIA, determine embolic source. Chief complaints; Visual Field Change; CT ANGIO HEAD NECK W WO CM; Stroke/TIA, determine embolic source; chronic right eye blurry vision, CAD, DMII, diverticulitis, DVT, GERD, HTN, gout who presented with left eye vision changes. He had noticed vision changes occurring upon waking up on 10/08/2023. TECHNIQUE: Axial CTA images of the head and neck performed with intravenous contrast. MIP reconstructed images were created and reviewed. Axial computed tomography images of the  head/brain performed without intravenous contrast. Note: Per PQRS, the description of internal carotid artery percent stenosis, including 0 percent or normal exam, is based on [place] American Symptomatic Carotid Endarterectomy Trial (NASCET) criteria. Dose reduction technique was used including one or more of the following: automated exposure control, adjustment of mA and kV according to patient size, and/or iterative reconstruction. CONTRAST: With and without; 75mL (iohexol  (OMNIPAQUE ) 350 MG/ML injection 75 mL IOHEXOL  350 MG/ML SOLN) COMPARISON: CT head without contrast 10/11/2023 and MR head without contrast 10/12/2023. FINDINGS: CT HEAD: BRAIN: No acute intraparenchymal hemorrhage. No mass lesion. No CT evidence for acute territorial infarct. Remote lacunar infarct is again noted in the posterior left lentiform nucleus. Atrophy and scattered subcortical T2 hyperintensities are mildly advanced for age. VENTRICLES: No hydrocephalus. ORBITS: The orbits are unremarkable. SINUSES AND MASTOIDS: The paranasal sinuses and mastoid air cells are clear. CTA NECK: Atherosclerotic changes are present at the aortic arch and great vessel origins without focal stenosis or aneurysm. COMMON CAROTID ARTERIES: Right common carotid artery is within normal limits. Atherosclerotic calcifications are present at the right carotid bifurcation without focal stenosis. Faint calcifications are present at the left carotid bifurcation without significant stenosis. INTERNAL CAROTID ARTERIES: Atherosclerotic changes are present within the cavernous internal carotid arteries bilaterally without a significant stenosis through the ICA termini. VERTEBRAL ARTERIES: The left vertebral  artery is dominant. Moderate stenosis is present at the origin of the right vertebral artery. CTA HEAD: ANTERIOR CEREBRAL ARTERIES: The A1 segments and the ACA branch vessels are normal. MIDDLE CEREBRAL ARTERIES: The M1 segments and  the MCA branch vessels are normal.  POSTERIOR CEREBRAL ARTERIES: The left posterior cerebral artery is of fetal type. Mild narrowing is present in the distal left P2 segment. BASILAR ARTERY: Atherosclerotic calcifications are present in the left V4 segment without focal stenosis. SOFT TISSUES: No acute finding. No masses or lymphadenopathy. BONES: No acute osseous abnormality. Multilevel degenerative changes are present in the cervical spine. No focal osseous lesions are present. Bilateral shoulder arthroplasty is noted. IMPRESSION: 1. No acute intracranial findings related to the clinical history of stroke/TIA. 2. Atherosclerotic changes in the aortic arch, great vessel origins, and carotid bifurcations without significant stenosis. 3. Moderate stenosis at the origin of the right vertebral artery. 4. Mild narrowing in the distal left P2 segment. Electronically signed by: Audree Leas MD 10/12/2023 09:28 PM EDT RP Workstation: [id number]   ECHOCARDIOGRAM COMPLETE Result Date: 10/12/2023    ECHOCARDIOGRAM REPORT   Patient Name:   [name] Date of Exam: 10/12/2023 Medical Rec #:  [id number]     Height:       73.0 in Accession #:    [id number]    Weight:       232.7 lb Date of Birth:  03-09-1961     BSA:          2.295 m Patient Age:    63 years      BP:           139/82 mmHg Patient Gender: M             HR:           79 bpm. Exam Location:  Inpatient Procedure: 2D Echo, Cardiac Doppler and Color Doppler (Both Spectral and Color            Flow Doppler were utilized during procedure). Indications:     Stroke  History:         Patient has prior history of Echocardiogram examinations, most                  recent 02/24/2022.  Sonographer:     Andrena Bang Referring Phys:  [id number] DENISE A WOLFE Diagnosing Phys: Pasqual Bone MD IMPRESSIONS  1. Left ventricular ejection fraction, by estimation, is 25 to 30%. The left ventricle has severely decreased function. The left ventricle demonstrates global hypokinesis. The left ventricular internal cavity  size was mildly dilated. There is moderate concentric left ventricular hypertrophy. Left ventricular diastolic parameters are consistent with Grade I diastolic dysfunction (impaired relaxation).  2. Right ventricular systolic function is low normal. The right ventricular size is normal.  3. Left atrial size was severely dilated.  4. Right atrial size was mildly dilated.  5. The mitral valve is degenerative. Mild mitral valve regurgitation.  6. The aortic valve is tricuspid. There is moderate calcification of the aortic valve. There is mild thickening of the aortic valve. Aortic valve regurgitation is trivial. Aortic valve sclerosis/calcification is present, without any evidence of aortic stenosis.  7. There is mild (Grade II) atheroma plaque involving the aortic root and ascending aorta.  8. The inferior vena cava is normal in size with greater than 50% respiratory variability, suggesting right atrial pressure of 3 mmHg. 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 FINDINGS  Left Ventricle: Left ventricular ejection  fraction, by estimation, is 25 to 30%. The left ventricle has severely decreased function. The left ventricle demonstrates global hypokinesis. Definity contrast agent was given IV to delineate the left ventricular endocardial borders. The left ventricular internal cavity size was mildly dilated. There is moderate concentric left ventricular hypertrophy. Left ventricular diastolic parameters are consistent with Grade I diastolic dysfunction (impaired relaxation). Right Ventricle: The right ventricular size is normal. No increase in right ventricular wall thickness. Right ventricular systolic function is low normal. Left Atrium: Left atrial size was severely dilated. Right Atrium: Right atrial size was mildly dilated. Pericardium: There is no evidence of pericardial effusion. Mitral Valve: The mitral valve is degenerative in appearance. There  is mild thickening of the mitral valve leaflet(s). There is mild calcification of the posterior mitral valve leaflet(s). Mild mitral annular calcification. Mild mitral valve regurgitation. Tricuspid Valve: The tricuspid valve is normal in structure. Tricuspid valve regurgitation is trivial. Aortic Valve: The aortic valve is tricuspid. There is moderate calcification of the aortic valve. There is mild thickening of the aortic valve. There is mild to moderate aortic valve annular calcification. Aortic valve regurgitation is trivial. Aortic valve sclerosis/calcification is present, without any evidence of aortic stenosis. Aortic valve mean gradient measures 7.0 mmHg. Aortic valve peak gradient measures 15.2 mmHg. Aortic valve area, by VTI measures 2.22 cm. Pulmonic Valve: The pulmonic valve was normal in structure. Pulmonic valve regurgitation is not visualized. Aorta: The aortic root is normal in size and structure. There is mild (Grade II) atheroma plaque involving the aortic root and ascending aorta. Venous: The inferior vena cava is normal in size with greater than 50% respiratory variability, suggesting right atrial pressure of 3 mmHg. IAS/Shunts: The atrial septum is grossly normal.  LEFT VENTRICLE PLAX 2D LVIDd:         5.30 cm      Diastology LVIDs:         5.20 cm      LV e' medial:  4.35 cm/s LV PW:         1.50 cm      LV e' lateral: 7.07 cm/s LV IVS:        1.80 cm LVOT diam:     2.50 cm LV SV:         77 LV SV Index:   33 LVOT Area:     4.91 cm  LV Volumes (MOD) LV vol d, MOD A2C: 340.0 ml LV vol d, MOD A4C: 385.0 ml LV vol s, MOD A2C: 271.0 ml LV vol s, MOD A4C: 274.0 ml LV SV MOD A2C:     69.0 ml LV SV MOD A4C:     385.0 ml LV SV MOD BP:      89.0 ml RIGHT VENTRICLE RV S prime:     11.90 cm/s TAPSE (M-mode): 1.8 cm LEFT ATRIUM            Index LA Vol (A2C): 115.0 ml 50.12 ml/m LA Vol (A4C): 65.4 ml  28.50 ml/m  AORTIC VALVE AV Area (Vmax):    2.41 cm AV Area (Vmean):   1.89 cm AV Area (VTI):      2.22 cm AV Vmax:           195.00 cm/s AV Vmean:          120.000 cm/s AV VTI:            0.345 m AV Peak Grad:      15.2 mmHg AV Mean Grad:  7.0 mmHg LVOT Vmax:         95.60 cm/s LVOT Vmean:        46.300 cm/s LVOT VTI:          0.156 m LVOT/AV VTI ratio: 0.45  AORTA Ao Asc diam: 3.40 cm  SHUNTS Systemic VTI:  0.16 m Systemic Diam: 2.50 cm Pasqual Bone MD Electronically signed by Pasqual Bone MD Signature Date/Time: 10/12/2023/5:48:13 PM    Final     Vitals:   10/13/23 0251 10/13/23 0402 10/13/23 0500 10/13/23 0721  BP: (!) 104/59 (!) 130/53  (!) 94/54  Pulse: 84 82  81  Resp: (!) 22 18  18   Temp:  98 F (36.7 C)  98 F (36.7 C)  TempSrc:    Oral  SpO2: 100% 98%  96%  Weight:   107.9 kg   Height:         PHYSICAL EXAM General:  Alert, well-nourished, well-developed patient in no acute distress Psych:  Mood and affect appropriate for situation CV: Regular rate and rhythm on monitor Respiratory:  Regular, unlabored respirations on room air GI: Abdomen soft and nontender   NEURO:  Mental Status: AA&Ox3, patient is able to give clear and coherent history Speech/Language: speech is without dysarthria or aphasia.  Naming, repetition, fluency, and comprehension intact.  Cranial Nerves:  II: PERRL. 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 Eyelids elevate symmetrically.  V: Sensation is intact to light touch and symmetrical to face.  VII: Face is symmetrical resting and smiling VIII: hearing intact to voice. IX, X: Palate elevates symmetrically. Phonation is normal.  [id number] shrug 5/5. XII: tongue is midline without fasciculations. Motor: 5/5 strength in bilateral uppers and left leg, right leg 4/5 with more proximal weakness than distal weakness  Tone: is normal and bulk is normal Sensation- decreased on left leg Extinction absent to light touch to DSS.   Coordination: FTN intact bilaterally, HKS: no ataxia in BLE.No drift.  Gait-  deferred   ASSESSMENT/PLAN  [name] is a 63 y.o. male with history of   a flutter on coumadin , , DVT, gout, HTN, DM2, neuropathy, CHF, cardiomyopathy, hx of renal cell carcinoma, ESRD on HD, CAD, GERD who woke up on Saturday morning with a small ark spot with no vision in the center of his left eye. Around mid day, the left eye vision is blurry.   NIH on Admission 1  Left central retinal artery branch occlusion  etiology:   Cardioembolic from a flutter despite anticoagulation  CT head No acute abnormality. Small vessel disease. Atrophy. chronic lacunar infarcts within the left lentiform nucleus and adjacent white matter, and within the pons. CTA head & neck ordered  MRI  no acute process 2D Echo EF 25-30%.  LV with global hypokinesis.  Moderate LVH with grade 1 diastolic dysfunction.  Left atrium severely dilated right atrium mildly dilated.  Mild atheroma plaque involving aortic root and ascending aorta ANA and cardiolipin antibodies pending LDL 108 HgbA1c 4.8 VTE prophylaxis - on warfarin  warfarin daily prior to admission, now on warfarin daily.  Recommend switching to Eliquis if patient is able to afford.  INR today is 1.3 Therapy recommendations:  Pending Disposition:  pending   Hx of Stroke/TIA 2012 chronic lacunar infarcts within the left lentiform nucleus and adjacent white matter, and within the pons.  Atrial Flutter  Home Meds: Coumadin , digoxin  0.125mg  Continue telemetry monitoring INR today 1.3 Continue Coumadin .  Recommend twitching  to Eliquis if affordable for patient  Hypotension Home meds:   midodrine   Stable Blood Pressure Goal: SBP less than 160   Hyperlipidemia CAD Cardiomyopathy Home meds:  none LDL 108, goal < 70 Add atorvastatin  40mg    Continue statin at discharge  Diabetes type II Controlled Home meds:  none HgbA1c 4.8, goal < 7.0 CBGs SSI Recommend close follow-up with PCP for better DM control   Dysphagia Patient has post-stroke  dysphagia, SLP consulted    Diet   Diet renal/carb modified with fluid restriction Diet-HS Snack? Nothing; Fluid restriction: 1200 mL Fluid; Room service appropriate? Yes; Fluid consistency: Thin   Advance diet as tolerated  Other Stroke Risk Factors Obesity, Body mass index is 31.37 kg/m., BMI >/= 30 associated with increased stroke risk, recommend weight loss, diet and exercise as appropriate  Coronary artery disease Congestive heart failure   Other Active Problems   Hospital day # 2   Jonette Nestle DNP, ACNPC-AG  [facility]  I have personally obtained history,examined this patient, reviewed notes, independently viewed imaging studies, participated in medical decision making and plan of care.ROS completed by me personally and pertinent positives fully documented  I have made any additions or clarifications directly to the above note. Agree with note above.  Patient's INR is suboptimal today at 1.3.  He states he has been quite compliant with his warfarin.  He is willing to consider switching to Eliquis if he can afford the co-pay.  If he is unable to afford would recommend bridging with Lovenox  and increasing warfarin to maintain INR between 2 and 3.  Stroke team will sign off.  Kindly call for questions.  Recommend outpatient referral to rheumatology due to elevated ESR and ANA.  Discussed with patient and Dr. Gladstone Lamer.  Greater than 50% time during this  35-minute visit was spent on counseling and coordination care team answering questions  Ardella Beaver, MD Medical Director [facility] Pager: [phone] 10/13/2023 1:26 PM     To contact Stroke Continuity provider, please refer to [email or web address]. After hours, contact General Neurology

## 2023-10-13 NOTE — Progress Notes (Signed)
 Patient came back from dialysis at 0401.

## 2023-10-13 NOTE — Progress Notes (Signed)
 PHARMACY - ANTICOAGULATION CONSULT NOTE  Pharmacy Consult for Warfarin  Indication: atrial fibrillation  Allergies  Allergen Reactions   Ace Inhibitors Itching, Cough and Other (See Comments)    Patient Measurements: Height: 6\' 1"  (185.4 cm) Weight: 107.9 kg (237 lb 12.8 oz) IBW/kg (Calculated) : 79.9 HEPARIN  DW (KG): 101.6  Vital Signs: Temp: 97.8 F (36.6 C) (05/22 1123) Temp Source: Oral (05/22 1123) BP: 117/87 (05/22 1123) Pulse Rate: 80 (05/22 1123)  Labs: Recent Labs    10/11/23 1225 10/12/23 0512 10/13/23 0300  HGB 11.6* 11.2* 9.6*  HCT 36.9* 34.8* 29.9*  PLT 218 233 220  LABPROT 20.4* 19.4* 16.4*  INR 1.7* 1.6* 1.3*  CREATININE 7.66* 9.14* 4.77*    Estimated Creatinine Clearance: 21.2 mL/min (A) (by C-G formula based on SCr of 4.77 mg/dL (H)).   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 Sharyn Deforest follows   Diabetes mellitus    pt. states he's borderline diabetic., no longer taking med,- off med. since 2013   Dialysis patient [facility])    Mon-Wed-Fri([facility])- Left AV fistula   Diverticulitis 03/2015   reoccurred in December 2016   Diverticulitis of intestine without perforation or abscess without bleeding 04/21/2015   Dizziness 11/22/2018   DVT (deep venous thrombosis) (HCC)    ESRD (end stage renal disease) (HCC)    GERD (gastroesophageal reflux disease)    Gout    Hemorrhage of left kidney 01/13/2018   History of nephrectomy 07/04/2012   History  of right nephrectomy in 2002 for renal cell carcinoma    History of unilateral nephrectomy    Hypertension    Hypotension due to hypovolemia 06/04/2020   Low iron     Myocardial infarction [facility]) ?2006   Renal cell carcinoma    dialysis- [facility]- [address]- Dr. Harry Lindau follows.   Renal insufficiency    Shortness of breath 05/19/2011   "at rest, lying down, w/exertion"   Stroke [facility]) 02/2011   05/19/11 denies residual   Umbilical hernia 05/19/2011   unrepaired   Wears glasses     Medications:  Medications Prior to Admission  Medication Sig Dispense Refill Last Dose/Taking   acetaminophen  (TYLENOL ) 500 MG tablet Take 500 mg by mouth as needed.   10/10/2023   albuterol  (VENTOLIN  HFA) 108 (90 Base) MCG/ACT inhaler Inhale 2 puffs into the lungs every 6 (six) hours as needed for wheezing or shortness of breath. 18 g 2 Taking As Needed   calcium  acetate (PHOSLO ) 667 MG capsule Take 1-3 capsules (667-2,001 mg total) by mouth See admin instructions. Take 2001 mg capsules with meals three times daily and 667 mg capsule with snacks.   10/10/2023   digoxin  (LANOXIN ) 0.125 MG tablet Take 1 tablet (0.125 mg total) by mouth every Monday, Wednesday, and Friday. 30 tablet 1 10/10/2023   gabapentin  (NEURONTIN ) 300 MG capsule Take 1 capsule (300 mg total) by mouth at bedtime. 90 capsule 1 10/10/2023   loperamide  (IMODIUM  A-D) 2 MG tablet Take 1  tablet (2 mg total) by mouth 4 (four) times daily as needed for diarrhea or loose stools. Also available OTC. 30 tablet 0 10/10/2023   midodrine  (PROAMATINE ) 10 MG tablet Take 1 tablet (10 mg total) by mouth 3 (three) times daily. 90 tablet 3 10/10/2023   pantoprazole  (PROTONIX ) 40 MG tablet Take 40 mg by mouth 2 (two) times daily.   Past Week   warfarin (COUMADIN ) 7.5 MG tablet Take 10 mg by mouth daily.   10/10/2023   Control Gel Formula Dressing (DUODERM CGF EXTRA THIN) MISC Apply 1 each topically daily as needed. (Patient not taking: Reported on 10/11/2023) 10 each 2  Not Taking   glucose blood test strip Use to check blood sugar 3 times a day. Dx code e11.65 100 each 12    HYDROcodone -acetaminophen  (NORCO) 7.5-325 MG tablet Take 1 tablet by mouth every 6 (six) hours as needed for moderate pain. (Patient not taking: Reported on 10/11/2023) 28 tablet 0 Not Taking   Lactobacillus (ACIDOPHILUS/BIFIDUS) WAFR Take 1 Wafer by mouth daily. (Patient not taking: Reported on 10/11/2023) 90 Wafer 1 Not Taking   Lancets (ONETOUCH DELICA PLUS LANCET33G) MISC Use to check blood sugar 3 times a day. Dx code e11.65 100 each 3    lidocaine  (LIDODERM ) 5 % Place 1 patch onto the skin daily. Remove & Discard patch within 12 hours or as directed by MD (Patient not taking: Reported on 10/11/2023) 30 patch 0 Not Taking   meclizine  (ANTIVERT ) 25 MG tablet Take 1 tablet (25 mg total) by mouth 3 (three) times daily as needed for dizziness. (Patient not taking: Reported on 10/11/2023) 30 tablet 0 Not Taking   metoprolol  succinate (TOPROL -XL) 25 MG 24 hr tablet Take 1 tablet (25 mg total) by mouth daily. (Patient not taking: Reported on 10/11/2023) 30 tablet 3 Not Taking   ondansetron  (ZOFRAN  ODT) 4 MG disintegrating tablet Take 1 tablet (4 mg total) by mouth every 8 (eight) hours as needed for nausea or vomiting. (Patient not taking: Reported on 10/11/2023) 20 tablet 0 Not Taking   ondansetron  (ZOFRAN ) 4 MG tablet Take 1 tablet (4 mg total) by mouth every 6 (six) hours. (Patient not taking: Reported on 10/11/2023) 12 tablet 0 Not Taking   oxyCODONE  (ROXICODONE ) 5 MG immediate release tablet Take 1 tablet (5 mg total) by mouth every 6 (six) hours as needed for moderate pain (pain score 4-6) or severe pain (pain score 7-10). (Patient not taking: Reported on 10/11/2023) 30 tablet 0 Not Taking   UNABLE TO FIND Out patient physical therapy- vestibular therapy for BPPV 1 each 0     Assessment: Admitted 10/11/23 for CVA workup due to complaint of  black spot in left eye, vision loss.  5/20 CT head:  no  acute intracranial hemorrhage, No acute intracranial finding.  Unchanged chronic lacunar infarcts within the left lentiform nucleus and adjacent white matter, and within the pons.  Pharmacy consult to dose Warfarin for nonvalvular Afib.  -PTA regimen confirmed with wife (she had bottle in hand) and fill history --10 mg tablets daily.  Last dose 5/19 PM. -INR 1.7 (subtherapeutic) on admission 5/20  5/22: INR continues to trend down, 1.3 today (likely reflective of missed dose on 5/20).  Hgb down today (11.2 > 9.6), pltc stable.  Good oral intake per documentation.  No bridge indicated w/ CHA2DsVASc = 5.  Goal of Therapy:  INR 2-3 Monitor platelets by anticoagulation protocol: Yes   Plan:  Give Warfarin 12.5 mg x1 tonight (~4% increase in  weekly regimen) Daily INR, CBC  Cecillia Cogan, PharmD Clinical Pharmacist 10/13/2023  11:47 AM

## 2023-10-13 NOTE — Progress Notes (Signed)
 Heart Failure Navigator Progress Note  Assessed for Heart & Vascular TOC clinic readiness.  Patient does not meet criteria due to history of ESRD on HD.   Navigator will sign off.  Jerilyn Monte, PharmD, BCPS Heart Failure Stewardship Pharmacist Phone [phone]

## 2023-10-13 NOTE — TOC CAGE-AID Note (Signed)
 Transition of Care [facility]) - CAGE-AID Screening   Patient Details  Name: [name] MRN: [id number] Date of Birth: September 14, 1960  [facility]) CM/SW Contact:    Shacoria Latif E Eldrige Pitkin, LCSW Phone Number: 10/13/2023, 1:27 PM   Clinical Narrative: Patient denied substance use.   CAGE-AID Screening:    Have You Ever Felt You Ought to Cut Down on Your Drinking or Drug Use?: No Have People Annoyed You By Critizing Your Drinking Or Drug Use?: No Have You Felt Bad Or Guilty About Your Drinking Or Drug Use?: No Have You Ever Had a Drink or Used Drugs First Thing In The Morning to Steady Your Nerves or to Get Rid of a Hangover?: No CAGE-AID Score: 0  Substance Abuse Education Offered: No

## 2023-10-13 NOTE — Assessment & Plan Note (Signed)
-   No S/S exacerbation -Follows with Dr. Sharyn Deforest outpatient - Echo does show some worsening on repeat this admission.  EF 25 to 30% down from prior 35 to 40% in October 2023 - Chronic hypotension prevents some GDMT - Continued on Toprol , digoxin , Coumadin

## 2023-10-13 NOTE — TOC Initial Note (Signed)
 Transition of Care [facility]) - Initial/Assessment Note    Patient Details  Name: [name] MRN: [id number] Date of Birth: 18-Sep-1960  Transition of Care [facility]) CM/SW Contact:    Cosimo Diones, RN Phone Number: 10/13/2023, 1:47 PM  Clinical Narrative: Patient presented for blurry vision and redness when he closes his eye. PTA patient was from home with spouse and daughter. Patent reports that he has a wheelchair in the home. Patient is asking for a rolling walker- DME ordered via [employer] and will be delivered to the room before the patient is discharged. Patient has transportation home. No further needs identified at this time.                   Expected Discharge Plan: Home/Self Care Barriers to Discharge: No Barriers Identified   Patient Goals and CMS Choice Patient states their goals for this hospitalization and ongoing recovery are:: to return home once stable.   Choice offered to / list presented to : NA      Expected Discharge Plan and Services In-house Referral: NA Discharge Planning Services: CM Consult Post Acute Care Choice: NA Living arrangements for the past 2 months: Single Family Home Expected Discharge Date: 10/13/23               DME Arranged: Otho Blitz rolling DME Agency: [employer] Date DME Agency Contacted: 10/13/23 Time DME Agency Contacted: 1346 Representative spoke with at DME Agency: April HH Arranged: NA          Prior Living Arrangements/Services Living arrangements for the past 2 months: Single Family Home Lives with:: Spouse, Adult Children Patient language and need for interpreter reviewed:: Yes Do you feel safe going back to the place where you live?: Yes      Need for Family Participation in Patient Care: Yes (Comment) Care giver support system in place?: Yes (comment) Current home services: DME (patient has wheelchair.) Criminal Activity/Legal Involvement Pertinent to Current Situation/Hospitalization: No - Comment as  needed  Activities of Daily Living   ADL Screening (condition at time of admission) Independently performs ADLs?: No Does the patient have a NEW difficulty with bathing/dressing/toileting/self-feeding that is expected to last >3 days?: Yes (Initiates electronic notice to provider for possible OT consult) (stroke workup) Does the patient have a NEW difficulty with getting in/out of bed, walking, or climbing stairs that is expected to last >3 days?: Yes (Initiates electronic notice to provider for possible PT consult) (stroke workup) Does the patient have a NEW difficulty with communication that is expected to last >3 days?: No Is the patient deaf or have difficulty hearing?: No Does the patient have difficulty seeing, even when wearing glasses/contacts?: Yes Does the patient have difficulty concentrating, remembering, or making decisions?: No  Permission Sought/Granted Permission sought to share information with : Family Supports, Case Production designer, theatre/television/film, Photographer granted to share info w AGENCY: [employer]        Emotional Assessment Appearance:: Appears stated age Attitude/Demeanor/Rapport: Engaged Affect (typically observed): Appropriate Orientation: : Oriented to Self, Oriented to Place, Oriented to  Time, Oriented to Situation Alcohol / Substance Use: Not Applicable Psych Involvement: No (comment)  Admission diagnosis:  Vision disturbance [H53.9] Vision changes [H53.9] ESRD on hemodialysis (HCC) [N18.6, Z99.2] Cerebrovascular accident (CVA), unspecified mechanism (HCC) [I63.9] Patient Active Problem List   Diagnosis Date Noted   Blurred vision, left eye 10/12/2023   Neuropathy 10/12/2023   Vision disturbance 10/11/2023   Typical atrial flutter (  HCC) 09/14/2023   Acute bilateral low back pain without sciatica 09/06/2023   History of diverticulitis 09/06/2023   Obesity (BMI 30-39.9) 09/06/2023   BMI 35.0-35.9,adult 09/06/2023   COVID-19 vaccination  declined 07/17/2023   Wheelchair dependent 07/17/2023   Vitamin D  deficiency 07/17/2023   S/P reverse total shoulder arthroplasty, left 03/18/2023   Herpes zoster vaccination declined 03/14/2023   Atherosclerosis of aorta (HCC) 03/14/2023   History of CVA (cerebrovascular accident) 03/14/2023   Type 2 diabetes mellitus with end-stage renal disease (HCC) 03/14/2023   Pre-op evaluation 03/14/2023   Chronic left shoulder pain 03/14/2023   History of reverse total replacement of right shoulder joint 09/20/2022   Diverticular disease of colon 06/29/2022   Intestinal malabsorption 06/29/2022   Rotator cuff arthropathy of right shoulder 12/15/2021   Osteoarthritis of knees, bilateral 11/26/2021   Rotator cuff arthropathy of left shoulder 09/15/2021   Infectious gastroenteritis and colitis, unspecified 06/10/2020   Allergy, unspecified, subsequent encounter 01/31/2020   Hypotension of hemodialysis 01/18/2019   Hypertensive heart and CKD, ESRD on dialysis, w CHF (HCC) 11/09/2018   Supratherapeutic INR 06/20/2018   Malnutrition of moderate degree 02/10/2018   Anemia 01/27/2018   Paroxysmal atrial fibrillation (HCC) 01/26/2018   Depression 01/26/2018   GERD (gastroesophageal reflux disease) 01/26/2018   Abnormal coagulation profile 01/24/2018   Cyst of kidney, acquired 01/24/2018   Other specified disorders of kidney and ureter 01/24/2018   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   Dependence on renal dialysis (HCC) 07/15/2017   Hypotension 03/16/2017   Paroxysmal atrial flutter (HCC) 03/16/2017   Chronic pain 03/16/2017   Leg pain, bilateral 03/16/2017   Elevated troponin 03/15/2016   Atrial flutter with rapid ventricular response (HCC) 06/27/2015   Right upper quadrant abdominal pain 01/13/2015   Diverticulitis large intestine w/o perforation or abscess w/o bleeding 12/18/2014   Hepatic cyst 12/18/2014   Pruritus, unspecified 09/05/2014   Hyperparathyroidism, unspecified (HCC) 06/25/2013    Complication of vascular dialysis catheter 05/16/2013   Cerebral infarction due to unspecified occlusion or stenosis of unspecified cerebral artery (HCC) 10/09/2012   Muscle weakness (generalized) 10/09/2012   Right sided weakness 10/05/2012   Disorder of phosphorus metabolism, unspecified 09/20/2012   Hypercalcemia 09/20/2012   Coagulation defect, unspecified (HCC) 07/08/2012   History of nephrectomy 07/04/2012   Umbilical hernia with obstruction, without gangrene 07/04/2012   Other specified complication of vascular prosthetic devices, implants and grafts, subsequent encounter 03/07/2012   Other cardiomyopathies (HCC) 02/14/2012   Body mass index (BMI) 38.0-38.9, adult 12/08/2011   End-stage renal disease on hemodialysis (HCC) 06/04/2011   Acquired absence of kidney 06/03/2011   Alcohol abuse, uncomplicated 06/03/2011   Allergy status to other drugs, medicaments and biological substances 06/03/2011   Anemia in chronic kidney disease 06/03/2011   Atherosclerotic heart disease of native coronary artery without angina pectoris 06/03/2011   Hyperlipidemia, unspecified 06/03/2011   Hypertensive chronic kidney disease with stage 5 chronic kidney disease or end stage renal disease (HCC) 06/03/2011   Iron  deficiency anemia, unspecified 06/03/2011   Ischemic cardiomyopathy 06/03/2011   Old myocardial infarction 06/03/2011   Personal history of other malignant neoplasm of kidney 06/03/2011   Personal history of transient ischemic attack (TIA), and cerebral infarction without residual deficits 06/03/2011   Secondary hyperparathyroidism of renal origin (HCC) 06/03/2011   Chronic combined systolic (congestive) and diastolic (congestive) heart failure (HCC) 05/19/2011    Class: Acute   Hypertension, benign 05/19/2011    Class: Chronic   DM II (diabetes mellitus, type II), controlled (  HCC) 05/19/2011    Class: Chronic   Gout, chronic 05/19/2011   Hyperkalemia 05/19/2011    Class: Acute   PCP:   Susanna Epley, FNP Pharmacy:   [id number] Jonette Nestle, [place] - [address] [place] [address] Phone: [phone] Fax: [phone]     Social Drivers of Health (SDOH) Social History: SDOH Screenings   Food Insecurity: No Food Insecurity (10/11/2023)  Housing: Low Risk  (10/11/2023)  Transportation Needs: No Transportation Needs (10/11/2023)  Utilities: Not At Risk (10/11/2023)  Alcohol Screen: Low Risk  (01/19/2023)  Depression (PHQ2-9): Low Risk  (01/19/2023)  Financial Resource Strain: Low Risk  (01/19/2023)  Physical Activity: Inactive (01/19/2023)  Social Connections: Moderately Integrated (01/19/2023)  Stress: No Stress Concern Present (01/19/2023)  Tobacco Use: Low Risk  (10/11/2023)  Health Literacy: Adequate Health Literacy (01/19/2023)   SDOH Interventions:     Readmission Risk Interventions    10/13/2023    1:44 PM  Readmission Risk Prevention Plan  Transportation Screening Complete  HRI or Home Care Consult Complete  Social Work Consult for Recovery Care Planning/Counseling Complete  Palliative Care Screening Not Applicable  Medication Review Oceanographer) Referral to Pharmacy

## 2023-10-13 NOTE — Evaluation (Signed)
 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     Clinical Impressions Pt walks touching furniture or using his RW in his home and uses a w/c outside of his home for longer distances. Pt is assisted for shower transfer and some aspects of bathing. He is assisted for LB dressing as needed, he has mored difficulty on dialysis days. Pt presents with impaired vision, no functional vision on R and impaired L upper quadrant loss on L. He needs min assist for sit to stand and CGA for ambulation in his room without AD. Recommended seated showering, pt agreeable to shower seat. Educated in benefits of OT for low vision and [facility]. Pt prefers to wait to see if his vision improves before intervention. Will provide pt with written information on [facility] who may be able to assist with low and high tech devices for vision loss compensation. Will follow acutely.      If plan is discharge home, recommend the following:   A little help with walking and/or transfers;A little help with bathing/dressing/bathroom;Assistance with cooking/housework;Assist for transportation;Help with stairs or ramp for entrance;Direct supervision/assist for medications management;Direct supervision/assist for financial management     Functional Status Assessment   Patient has had a recent decline in their functional status and demonstrates the ability to make significant improvements in function in a reasonable and predictable amount of time.     Equipment Recommendations   Tub/shower seat      Recommendations for Other Services         Precautions/Restrictions   Precautions Precautions: Fall Recall of Precautions/Restrictions: Intact Restrictions Weight Bearing Restrictions Per Provider Order: No     Mobility Bed Mobility Overal bed mobility: Modified Independent                  Transfers Overall transfer level: Needs assistance Equipment used: None Transfers: Sit to/from Stand Sit to Stand: Min assist           General transfer comment: assist to rise from bed, pt reports "bone on bone" knees      Balance                                           ADL either performed or assessed with clinical judgement   ADL Overall ADL's : At baseline                                             Vision Baseline Vision/History: 2 Legally blind;1 Wears glasses Ability to See in Adequate Light: 3 Highly impaired Additional Comments: blurriness in R eye, new upper quadrant loss of vision in L eye, pt was typically using readers     Perception         Praxis         Pertinent Vitals/Pain Pain Assessment Pain Assessment: Faces Faces Pain Scale: Hurts little more Pain  Location: R shoulder Pain Descriptors / Indicators: Discomfort Pain Intervention(s): Monitored during session, Repositioned     Extremity/Trunk Assessment Upper Extremity Assessment Upper Extremity Assessment: Right hand dominant;RUE deficits/detail;LUE deficits/detail RUE Deficits / Details: longstanding shoulder limitations RUE Coordination: decreased gross motor LUE Deficits / Details: longstanding shoulder limitations LUE Coordination: decreased gross motor   Lower Extremity Assessment Lower Extremity Assessment: Defer to PT evaluation   Cervical / Trunk Assessment Cervical / Trunk Assessment: Normal   Communication Communication Communication: No apparent difficulties   Cognition Arousal: Alert Behavior During Therapy: WFL for  tasks assessed/performed Cognition: No apparent impairments                               Following commands: Intact       Cueing  General Comments   Cueing Techniques: Verbal cues      Exercises     Shoulder Instructions      Home Living Family/patient expects to be discharged to:: Private residence Living Arrangements: Spouse/significant other Available Help at Discharge: Family Type of Home: House Home Access: Ramped entrance     Home Layout: One level     Bathroom Shower/Tub: Producer, television/film/video: Standard     Home Equipment: Agricultural consultant (2 wheels);Grab bars - toilet;Grab bars - tub/shower;Wheelchair - manual;Cane - single point          Prior Functioning/Environment Prior Level of Function : Needs assist             Mobility Comments: uses RW or furniture walks in house, w/c for longer distances ADLs Comments: wife assist with showering, sometimes with LB dressing and all IADLs    OT Problem List: Impaired balance (sitting and/or standing);Impaired vision/perception;Impaired UE functional use;Pain   OT Treatment/Interventions:        OT Goals(Current goals can be found in the care plan section)   Acute Rehab OT Goals OT Goal Formulation: With patient Time For Goal Achievement: 10/27/23 Potential to Achieve Goals: Good   OT Frequency:       Co-evaluation              AM-PAC OT "6 Clicks" Daily Activity     Outcome Measure Help from another person eating meals?: None Help from another person taking care of personal grooming?: A Little Help from another person toileting, which includes using toliet, bedpan, or urinal?: A Little Help from another person bathing (including washing, rinsing, drying)?: A Little Help from another person to put on and taking off regular upper body clothing?: None Help from another person to put on and taking off regular lower body clothing?: A Little 6 Click Score: 20   End  of Session Equipment Utilized During Treatment: Gait belt Nurse Communication: Mobility status  Activity Tolerance: Patient tolerated treatment well Patient left: in bed;with call bell/phone within reach;with family/visitor present  OT Visit Diagnosis: Unsteadiness on feet (R26.81);Other abnormalities of gait and mobility (R26.89);Low vision, both eyes (H54.2);History of falling (Z91.81);Muscle weakness (generalized) (M62.81);Pain                Time: 8295-6213 OT Time Calculation (min): 15 min Charges:  OT General Charges $OT Visit: 1 Visit OT Evaluation $OT Eval Low Complexity: 1 Low  Avanell Leigh, OTR/L Acute Rehabilitation Services Office: [phone]  Jonette Nestle 10/13/2023, 10:16 AM

## 2023-10-13 NOTE — Progress Notes (Signed)
 Hypoglycemic Event  CBG: 63  Treatment: nepro berry flavor   Symptoms: None  Follow-up CBG: Time:2224 CBG Result:82  Possible Reasons for Event: Inadequate meal intake  Comments/MD notified:    Adiyah Lame R Breniya Goertzen

## 2023-10-13 NOTE — Evaluation (Signed)
 Physical Therapy Brief Evaluation and Discharge Note Patient Details Name: [name] MRN: [id number] DOB: [birth year] Today's Date: 10/13/2023   History of Present Illness  Pt is a 63 year old man admitted on 10/11/23 with L upper quadrant vision loss secondary to retinal artery branch occlusion. 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  Clinical Impression  Pt lives with wife, daughter and grandchildren in single story home with ramped entrance. Pt reports ambulation in home with furniture surfing, limited community distances with RW and use of wheelchair or store scooter for community distances. Pt is independent with ADLs, and family assists with iADLs at baseline. Pt is supervision for transfers and light contact guard to supervision for ambulation in room and hallway. Pt is at his baseline level of function. PT signing off but has referred pt to mobility team to maintain mobility.        PT Assessment Patient does not need any further PT services  Assistance Needed at Discharge  Frequent or constant Supervision/Assistance    Equipment Recommendations None recommended by PT     Precautions/Restrictions Precautions Precautions: Fall Recall of Precautions/Restrictions: Intact Restrictions Weight Bearing Restrictions Per Provider Order: No        Mobility  Bed Mobility          Transfers Overall transfer level: Needs assistance Equipment used: None Transfers: Sit to/from Stand Sit to Stand: Supervision           General transfer comment: good power up and self steadying    Ambulation/Gait Ambulation/Gait assistance: Contact guard assist Gait Distance (Feet): 18 Feet (+50) Assistive device: None, Rolling walker (2 wheels) Gait Pattern/deviations: Step-through pattern, Decreased stance time - left, Decreased weight shift to left, Wide  base of support Gait Speed: Pace [facility] General Gait Details: had pt use furniture to walk to door, used appropriate stable foot of bed, sink and wall, not tray table and guest chair which were available. Pt able to ambulate in hallway with RW, increased B knee pain limiting and pt returned to room  Home Activity Instructions Home Activity Instructions: continue to get up and move around do not sit in any one place more than 1 hour without getting up and moving around      Balance Overall balance assessment: Mild deficits observed, not formally tested                        Pertinent Vitals/Pain PT - Brief Vital Signs All Vital Signs Stable: Yes Pain Assessment Pain Assessment: Faces Faces Pain Scale: Hurts little more Pain Location: R shoulder Pain Descriptors / Indicators: Discomfort Pain Intervention(s): Monitored during session     Home Living Family/patient expects to be discharged to:: Private residence Living Arrangements: Spouse/significant other Available Help at Discharge: Available PRN/intermittently (mostly 24 hr/day but when left alone he stays stationary until his wife returns) Home Environment: Ramped entrance   Home Equipment: Agricultural consultant (2 wheels);Grab bars - toilet;Grab bars - tub/shower;Wheelchair - manual;Cane - single point        Prior Function Level of Independence: Needs assistance Comments: ambulates in home with furniture surfing family provides for iADLs    UE/LE Assessment   UE ROM/Strength/Tone/Coordination: Impaired UE ROM/Strength/Tone/Coordination Deficits: R shoulder pain from surgery  LE ROM/Strength/Tone/Coordination: Impaired LE ROM/Strength/Tone/Coordination Deficits: R LE stonger than L LE    Communication   Communication Communication: No  apparent difficulties     Cognition Overall Cognitive Status: Appears within functional limits for tasks assessed/performed       General Comments General comments (skin  integrity, edema, etc.): pt with good compensatory strategies for visual deficits. L UE IV infiltration left pt with UE elevated on pillow,     PT Problem List         PT Visit Diagnosis Unsteadiness on feet (R26.81);Muscle weakness (generalized) (M62.81)    No Skilled PT Patient at baseline level of functioning;Patient will have necessary level of assist by caregiver at discharge;All education completed    AMPAC 6 Clicks Help needed turning from your back to your side while in a flat bed without using bedrails?: None Help needed moving from lying on your back to sitting on the side of a flat bed without using bedrails?: None Help needed moving to and from a bed to a chair (including a wheelchair)?: None Help needed standing up from a chair using your arms (e.g., wheelchair or bedside chair)?: None Help needed to walk in hospital room?: None Help needed climbing 3-5 steps with a railing? : A Little 6 Click Score: 23      End of Session Equipment Utilized During Treatment: Gait belt Activity Tolerance: Patient tolerated treatment well Patient left: in chair;with call bell/phone within reach Nurse Communication: Mobility status PT Visit Diagnosis: Unsteadiness on feet (R26.81);Muscle weakness (generalized) (M62.81)     Time: 1610-9604 PT Time Calculation (min) (ACUTE ONLY): 13 min  Charges:   PT Evaluation $PT Eval Low Complexity: 1 Low      Sten Dematteo B. Jewel Mortimer PT, DPT Acute Rehabilitation Services Please use secure chat or  Call Office [phone]   Verlie Glisson [facility]  10/13/2023, 12:31 PM

## 2023-10-13 NOTE — Progress Notes (Signed)
 D/C order noted. Contacted [facility] to be advised of pt's d/c today and that pt should resume care tomorrow.   Olivia Canter Renal Navigator [phone]

## 2023-10-13 NOTE — Care Management Important Message (Signed)
 Important Message  Patient Details  Name: [name] MRN: [id number] Date of Birth: 10-22-1960   Important Message Given:  Yes - Medicare IM     Felix Host 10/13/2023, 12:53 PM

## 2023-10-13 NOTE — Plan of Care (Signed)

## 2023-10-13 NOTE — Discharge Instructions (Signed)
 Follow up with retinal specialist at discharge.  [facility] retina specialists [address], [address], [place], [place], [address] [phone]  OR   [facility] [address], [address] [place], [place]  [phone]

## 2023-10-13 NOTE — Discharge Summary (Signed)
 Physician Discharge Summary   [name] ZOX:[id number] DOB: 28-Aug-1960 DOA: 10/11/2023  PCP: Susanna Epley, FNP  Admit date: 10/11/2023 Discharge date: 10/13/2023  Admitted From: Home Disposition:  Home Discharging physician: Faith Homes, MD Barriers to discharge: none  Recommendations at discharge: Follow up with cardiology Patient recommended to follow-up with retinal specialist   Discharge Condition: stable CODE STATUS: Full  Diet recommendation:  Diet Orders (From admission, onward)     Start     Ordered   10/13/23 0000  Diet Carb Modified        10/13/23 1309   10/11/23 2142  Diet renal/carb modified with fluid restriction Diet-HS Snack? Nothing; Fluid restriction: 1200 mL Fluid; Room service appropriate? Yes; Fluid consistency: Thin  Diet effective now       Question Answer Comment  Diet-HS Snack? Nothing   Fluid restriction: 1200 mL Fluid   Room service appropriate? Yes   Fluid consistency: Thin      10/11/23 2141            Hospital Course: Mr. Raper is a 63 yo male with PMH ESRD on HD, chronic right eye blurry vision, CAD, DMII, diverticulitis, DVT, GERD, HTN, gout who presented with left eye vision changes.  He had noticed vision changes occurring upon waking up on 10/08/2023. Due to no improvement over the past few days, he finally presented to the ER for further evaluation. He was admitted for further stroke workup.  Assessment and Plan: * Blurred vision, left eye - reports to ophthalmology that vision changes were approximately at least 1 week (but he seems to emphasize sxms mostly Saturday morning); painless vision loss affecting left eye central vision -MRI brain unremarkable for acute findings.  Shows chronic lacunar infarcts -Evaluated by ophthalmology on 10/12/2023.  Findings deemed consistent with BRAO - He is recommended to follow-up outpatient with retinal specialist, nonurgent -No significant stenosis noted on CTA head/neck - Patient  discussed with cardiology and declined changing anticoagulation and wished to continue on Coumadin  and states good compliance outpatient with usually controlled INR levels.  He attributed subtherapeutic levels on admission due to him eating extra salad his wife made recently - Echo also showed grade 2 atheroma plaque involving aortic root and ascending aorta also seen on prior echo in October 2023  End-stage renal disease on hemodialysis [facility]) - Nephrology consulted - Continue dialysis per nephrology  Paroxysmal atrial flutter (HCC) - On Coumadin  outpatient - Continue Coumadin  per pharmacy - No longer on Toprol  per med rec - continue digoxin   -Monitor rate for now  Wheelchair dependent - Uses walker at times but also depends on wheelchair - He remains at his baseline  Hypotension - Reported worse after HD sessions - Continue midodrine   Chronic combined systolic (congestive) and diastolic (congestive) heart failure (HCC) - No S/S exacerbation -Follows with Dr. Sharyn Deforest outpatient - Echo does show some worsening on repeat this admission.  EF 25 to 30% down from prior 35 to 40% in October 2023 - Chronic hypotension prevents some GDMT - Continued on Toprol , digoxin , Coumadin   Type 2 diabetes mellitus with end-stage renal disease (HCC) - No home meds -Last A1c 4.8% on 10/12/2023 - Continue diet control  Hyperkalemia - Elevated 10/12/2023 -Lokelma  given per nephrology and planning for dialysis as well  Neuropathy - gets worsening pains even on non-HD days - will continue gaba 300 mg daily for now  Obesity (BMI 30-39.9) - Complicates overall prognosis and care - Body mass index is 30.7 kg/m.  Hyperlipidemia,  unspecified - LDL 108 - No statin noted on med rec - Started on Lipitor 40 mg per neurology  Hypertension, benign - has postdialysis hypotension and is on midodrine    The patient's acute and chronic medical conditions were treated accordingly. On day of discharge,  patient was felt deemed stable for discharge. Patient/family member advised to call PCP or come back to ER if needed.   Principal Diagnosis: Blurred vision, left eye  Discharge Diagnoses: Active Hospital Problems   Diagnosis Date Noted   Blurred vision, left eye 10/12/2023    Priority: 1.   End-stage renal disease on hemodialysis (HCC) 06/04/2011    Priority: 2.   Paroxysmal atrial flutter (HCC) 03/16/2017    Priority: 3.   Wheelchair dependent 07/17/2023    Priority: 4.   Hypotension 03/16/2017    Priority: 4.   Chronic combined systolic (congestive) and diastolic (congestive) heart failure (HCC) 05/19/2011    Priority: 4.    Class: Acute   Type 2 diabetes mellitus with end-stage renal disease (HCC) 03/14/2023    Priority: 5.   Hyperkalemia 05/19/2011    Priority: 6.    Class: Acute   Neuropathy 10/12/2023   Obesity (BMI 30-39.9) 09/06/2023   Hyperlipidemia, unspecified 06/03/2011   Hypertension, benign 05/19/2011    Class: Chronic    Resolved Hospital Problems  No resolved problems to display.     Discharge Instructions     Diet Carb Modified   Complete by: As directed    Increase activity slowly   Complete by: As directed       Allergies as of 10/13/2023       Reactions   Ace Inhibitors Itching, Cough, Other (See Comments)        Medication List     STOP taking these medications    Acidophilus/Bifidus Wafr   HYDROcodone -acetaminophen  7.5-325 MG tablet Commonly known as: NORCO   lidocaine  5 % Commonly known as: Lidoderm    meclizine  25 MG tablet Commonly known as: ANTIVERT    metoprolol  succinate 25 MG 24 hr tablet Commonly known as: TOPROL -XL   ondansetron  4 MG disintegrating tablet Commonly known as: Zofran  ODT   ondansetron  4 MG tablet Commonly known as: ZOFRAN    oxyCODONE  5 MG immediate release tablet Commonly known as: Roxicodone        TAKE these medications    acetaminophen  500 MG tablet Commonly known as: TYLENOL  Take 500 mg  by mouth as needed.   albuterol  108 (90 Base) MCG/ACT inhaler Commonly known as: VENTOLIN  HFA Inhale 2 puffs into the lungs every 6 (six) hours as needed for wheezing or shortness of breath.   atorvastatin  40 MG tablet Commonly known as: LIPITOR Take 1 tablet (40 mg total) by mouth daily. Start taking on: Oct 14, 2023   calcium  acetate 667 MG capsule Commonly known as: PHOSLO  Take 1-3 capsules (667-2,001 mg total) by mouth See admin instructions. Take 2001 mg capsules with meals three times daily and 667 mg capsule with snacks.   digoxin  0.125 MG tablet Commonly known as: LANOXIN  Take 1 tablet (0.125 mg total) by mouth every Monday, Wednesday, and Friday.   DuoDERM CGF Extra Thin Misc Apply 1 each topically daily as needed.   gabapentin  300 MG capsule Commonly known as: NEURONTIN  Take 1 capsule (300 mg total) by mouth at bedtime.   glucose blood test strip Use to check blood sugar 3 times a day. Dx code e11.65   loperamide  2 MG tablet Commonly known as: IMODIUM  A-D Take 1 tablet (2  mg total) by mouth 4 (four) times daily as needed for diarrhea or loose stools. Also available OTC.   midodrine  10 MG tablet Commonly known as: PROAMATINE  Take 1 tablet (10 mg total) by mouth 3 (three) times daily.   OneTouch Delica Plus Lancet33G Misc Use to check blood sugar 3 times a day. Dx code e11.65   pantoprazole  40 MG tablet Commonly known as: PROTONIX  Take 40 mg by mouth 2 (two) times daily.   UNABLE TO FIND Out patient physical therapy- vestibular therapy for BPPV   warfarin 7.5 MG tablet Commonly known as: COUMADIN  Take 10 mg by mouth daily.               Durable Medical Equipment  (From admission, onward)           Start     Ordered   10/13/23 1341  For home use only DME Walker rolling  Once       Question Answer Comment  Walker: With 5 Inch Wheels   Patient needs a walker to treat with the following condition General weakness      10/13/23 1341             Follow-up Information     [facility] Follow up.   Why: Rolling Walker to be delivered to the room Contact information: Address: [address], [place], [place] [address] Phone: [phone]               Allergies  Allergen Reactions   Ace Inhibitors Itching, Cough and Other (See Comments)    Consultations: Nephrology Neurology Ophthalmology  Procedures:   Discharge Exam: BP 117/87 (BP Location: Right Wrist)   Pulse 80   Temp 97.8 F (36.6 C) (Oral)   Resp 18   Ht 6\' 1"  (1.854 m)   Wt 107.9 kg   SpO2 96%   BMI 31.37 kg/m  Physical Exam Constitutional:      General: He is not in acute distress.    Appearance: Normal appearance.  HENT:     Head: Normocephalic and atraumatic.     Mouth/Throat:     Mouth: Mucous membranes are moist.  Eyes:     Comments: Dilated B/L pupils, difficulty with interpreting reactive due to photosensitivity. Grossly blurry vision now bilaterally  Cardiovascular:     Rate and Rhythm: Normal rate and regular rhythm.  Pulmonary:     Effort: Pulmonary effort is normal. No respiratory distress.     Breath sounds: Normal breath sounds. No wheezing.  Abdominal:     General: Bowel sounds are normal. There is no distension.     Palpations: Abdomen is soft.     Tenderness: There is no abdominal tenderness.  Musculoskeletal:        General: Normal range of motion.     Cervical back: Normal range of motion and neck supple.  Skin:    General: Skin is warm and dry.  Neurological:     General: No focal deficit present.     Mental Status: He is alert.  Psychiatric:        Mood and Affect: Mood normal.        Behavior: Behavior normal.      The results of significant diagnostics from this hospitalization (including imaging, microbiology, ancillary and laboratory) are listed below for reference.   Microbiology: Recent Results (from the past 240 hours)  MRSA Next Gen by PCR, Nasal     Status: None    Collection Time:  10/11/23  9:10 PM   Specimen: Nasal Mucosa; Nasal Swab  Result Value Ref Range Status   MRSA by PCR Next Gen NOT DETECTED NOT DETECTED Final    Comment: (NOTE) The GeneXpert MRSA Assay (FDA approved for NASAL specimens only), is one component of a comprehensive MRSA colonization surveillance program. It is not intended to diagnose MRSA infection nor to guide or monitor treatment for MRSA infections. Test performance is not FDA approved in patients less than 67 years old. Performed at [facility], [address], [place], [place] [address]      Labs: BNP (last 3 results) No results for input(s): "BNP" in the last 8760 hours. Basic Metabolic Panel: Recent Labs  Lab 10/11/23 1225 10/12/23 0512 10/13/23 0300  NA 136 135 134*  K 5.1 6.2* 3.9  CL 94* 96* 95*  CO2 28 28 28   GLUCOSE 94 95 95  BUN 25* 30* 15  CREATININE 7.66* 9.14* 4.77*  CALCIUM  9.7 8.5* 9.3  MG 2.3  --  2.1  PHOS 5.3* 7.2* 4.5   Liver Function Tests: Recent Labs  Lab 10/11/23 1225 10/12/23 0512 10/13/23 0300  AST 12*  --   --   ALT 6  --   --   ALKPHOS 116  --   --   BILITOT 0.6  --   --   PROT 8.2*  --   --   ALBUMIN  3.9 3.1* 3.3*   No results for input(s): "LIPASE", "AMYLASE" in the last 168 hours. No results for input(s): "AMMONIA" in the last 168 hours. CBC: Recent Labs  Lab 10/11/23 1225 10/12/23 0512 10/13/23 0300  WBC 4.9 5.3 4.6  NEUTROABS  --   --  2.9  HGB 11.6* 11.2* 9.6*  HCT 36.9* 34.8* 29.9*  MCV 93.9 92.1 92.6  PLT 218 233 220   Cardiac Enzymes: No results for input(s): "CKTOTAL", "CKMB", "CKMBINDEX", "TROPONINI" in the last 168 hours. BNP: Invalid input(s): "POCBNP" CBG: Recent Labs  Lab 10/12/23 1212 10/12/23 2052 10/12/23 2224 10/13/23 0835 10/13/23 1120  GLUCAP 74 63* 82 95 64*   D-Dimer No results for input(s): "DDIMER" in the last 72 hours. Hgb A1c Recent Labs    10/12/23 0845  HGBA1C 4.8   Lipid Profile Recent Labs     10/12/23 0845  CHOL 182  HDL 50  LDLCALC 108*  TRIG 120  CHOLHDL 3.6   Thyroid  function studies Recent Labs    10/12/23 0017  TSH 1.692   Anemia work up Recent Labs    10/12/23 0017  VITAMINB12 407  FOLATE 5.8*   Urinalysis    Component Value Date/Time   COLORURINE YELLOW 05/19/2011 1042   APPEARANCEUR CLEAR 05/19/2011 1042   LABSPEC 1.013 05/19/2011 1042   PHURINE 6.5 05/19/2011 1042   GLUCOSEU NEGATIVE 05/19/2011 1042   HGBUR TRACE (A) 05/19/2011 1042   BILIRUBINUR NEGATIVE 05/19/2011 1042   KETONESUR NEGATIVE 05/19/2011 1042   PROTEINUR >300 (A) 05/19/2011 1042   UROBILINOGEN 0.2 05/19/2011 1042   NITRITE NEGATIVE 05/19/2011 1042   LEUKOCYTESUR NEGATIVE 05/19/2011 1042   Sepsis Labs Recent Labs  Lab 10/11/23 1225 10/12/23 0512 10/13/23 0300  WBC 4.9 5.3 4.6   Microbiology Recent Results (from the past 240 hours)  MRSA Next Gen by PCR, Nasal     Status: None   Collection Time: 10/11/23  9:10 PM   Specimen: Nasal Mucosa; Nasal Swab  Result Value Ref Range Status   MRSA by PCR Next Gen NOT DETECTED NOT DETECTED  Final    Comment: (NOTE) The GeneXpert MRSA Assay (FDA approved for NASAL specimens only), is one component of a comprehensive MRSA colonization surveillance program. It is not intended to diagnose MRSA infection nor to guide or monitor treatment for MRSA infections. Test performance is not FDA approved in patients less than 69 years old. Performed at [facility], [address], [place], [place] [address]     Procedures/Studies: CT ANGIO HEAD NECK W WO CM Result Date: 10/12/2023 EXAM: CTA Head and Neck with Intravenous Contrast. CT Head without Contrast. CLINICAL HISTORY: Stroke/TIA, determine embolic source. Chief complaints; Visual Field Change; CT ANGIO HEAD NECK W WO CM; Stroke/TIA, determine embolic source; chronic right eye blurry vision, CAD, DMII, diverticulitis, DVT, GERD, HTN, gout who presented with left eye vision changes. He  had noticed vision changes occurring upon waking up on 10/08/2023. TECHNIQUE: Axial CTA images of the head and neck performed with intravenous contrast. MIP reconstructed images were created and reviewed. Axial computed tomography images of the head/brain performed without intravenous contrast. Note: Per PQRS, the description of internal carotid artery percent stenosis, including 0 percent or normal exam, is based on [place] American Symptomatic Carotid Endarterectomy Trial (NASCET) criteria. Dose reduction technique was used including one or more of the following: automated exposure control, adjustment of mA and kV according to patient size, and/or iterative reconstruction. CONTRAST: With and without; 75mL (iohexol  (OMNIPAQUE ) 350 MG/ML injection 75 mL IOHEXOL  350 MG/ML SOLN) COMPARISON: CT head without contrast 10/11/2023 and MR head without contrast 10/12/2023. FINDINGS: CT HEAD: BRAIN: No acute intraparenchymal hemorrhage. No mass lesion. No CT evidence for acute territorial infarct. Remote lacunar infarct is again noted in the posterior left lentiform nucleus. Atrophy and scattered subcortical T2 hyperintensities are mildly advanced for age. VENTRICLES: No hydrocephalus. ORBITS: The orbits are unremarkable. SINUSES AND MASTOIDS: The paranasal sinuses and mastoid air cells are clear. CTA NECK: Atherosclerotic changes are present at the aortic arch and great vessel origins without focal stenosis or aneurysm. COMMON CAROTID ARTERIES: Right common carotid artery is within normal limits. Atherosclerotic calcifications are present at the right carotid bifurcation without focal stenosis. Faint calcifications are present at the left carotid bifurcation without significant stenosis. INTERNAL CAROTID ARTERIES: Atherosclerotic changes are present within the cavernous internal carotid arteries bilaterally without a significant stenosis through the ICA termini. VERTEBRAL ARTERIES: The left vertebral artery is dominant.  Moderate stenosis is present at the origin of the right vertebral artery. CTA HEAD: ANTERIOR CEREBRAL ARTERIES: The A1 segments and the ACA branch vessels are normal. MIDDLE CEREBRAL ARTERIES: The M1 segments and  the MCA branch vessels are normal. POSTERIOR CEREBRAL ARTERIES: The left posterior cerebral artery is of fetal type. Mild narrowing is present in the distal left P2 segment. BASILAR ARTERY: Atherosclerotic calcifications are present in the left V4 segment without focal stenosis. SOFT TISSUES: No acute finding. No masses or lymphadenopathy. BONES: No acute osseous abnormality. Multilevel degenerative changes are present in the cervical spine. No focal osseous lesions are present. Bilateral shoulder arthroplasty is noted. IMPRESSION: 1. No acute intracranial findings related to the clinical history of stroke/TIA. 2. Atherosclerotic changes in the aortic arch, great vessel origins, and carotid bifurcations without significant stenosis. 3. Moderate stenosis at the origin of the right vertebral artery. 4. Mild narrowing in the distal left P2 segment. Electronically signed by: Audree Leas MD 10/12/2023 09:28 PM EDT RP Workstation: [id number]   ECHOCARDIOGRAM COMPLETE Result Date: 10/12/2023    ECHOCARDIOGRAM REPORT   Patient Name:  Rollie A Anselmo Date of Exam: 10/12/2023 Medical Rec #:  [id number]     Height:       73.0 in Accession #:    [id number]    Weight:       232.7 lb Date of Birth:  1961/02/10     BSA:          2.295 m Patient Age:    62 years      BP:           139/82 mmHg Patient Gender: M             HR:           79 bpm. Exam Location:  Inpatient Procedure: 2D Echo, Cardiac Doppler and Color Doppler (Both Spectral and Color            Flow Doppler were utilized during procedure). Indications:     Stroke  History:         Patient has prior history of Echocardiogram examinations, most                  recent 02/24/2022.  Sonographer:     Andrena Bang Referring Phys:  [id number] DENISE A WOLFE  Diagnosing Phys: Pasqual Bone MD IMPRESSIONS  1. Left ventricular ejection fraction, by estimation, is 25 to 30%. The left ventricle has severely decreased function. The left ventricle demonstrates global hypokinesis. The left ventricular internal cavity size was mildly dilated. There is moderate concentric left ventricular hypertrophy. Left ventricular diastolic parameters are consistent with Grade I diastolic dysfunction (impaired relaxation).  2. Right ventricular systolic function is low normal. The right ventricular size is normal.  3. Left atrial size was severely dilated.  4. Right atrial size was mildly dilated.  5. The mitral valve is degenerative. Mild mitral valve regurgitation.  6. The aortic valve is tricuspid. There is moderate calcification of the aortic valve. There is mild thickening of the aortic valve. Aortic valve regurgitation is trivial. Aortic valve sclerosis/calcification is present, without any evidence of aortic stenosis.  7. There is mild (Grade II) atheroma plaque involving the aortic root and ascending aorta.  8. The inferior vena cava is normal in size with greater than 50% respiratory variability, suggesting right atrial pressure of 3 mmHg. 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 FINDINGS  Left Ventricle: Left ventricular ejection fraction, by estimation, is 25 to 30%. The left ventricle has severely decreased function. The left ventricle demonstrates global hypokinesis. Definity contrast agent was given IV to delineate the left ventricular endocardial borders. The left ventricular internal cavity size was mildly dilated. There is moderate concentric left ventricular hypertrophy. Left ventricular diastolic parameters are consistent with Grade I diastolic dysfunction (impaired relaxation). Right Ventricle: The right ventricular size is normal. No increase in right ventricular wall thickness. Right ventricular systolic  function is low normal. Left Atrium: Left atrial size was severely dilated. Right Atrium: Right atrial size was mildly dilated. Pericardium: There is no evidence of pericardial effusion. Mitral Valve: The mitral valve is degenerative in appearance. There is mild thickening of the mitral valve leaflet(s). There is mild calcification of the posterior mitral valve leaflet(s). Mild mitral annular calcification. Mild mitral valve regurgitation. Tricuspid Valve: The tricuspid valve is normal in structure. Tricuspid valve regurgitation is trivial. Aortic Valve: The aortic valve is tricuspid. There is moderate calcification of the aortic valve. There is mild thickening of the aortic valve. There is mild to moderate aortic valve annular calcification. Aortic valve regurgitation is  trivial. Aortic valve sclerosis/calcification is present, without any evidence of aortic stenosis. Aortic valve mean gradient measures 7.0 mmHg. Aortic valve peak gradient measures 15.2 mmHg. Aortic valve area, by VTI measures 2.22 cm. Pulmonic Valve: The pulmonic valve was normal in structure. Pulmonic valve regurgitation is not visualized. Aorta: The aortic root is normal in size and structure. There is mild (Grade II) atheroma plaque involving the aortic root and ascending aorta. Venous: The inferior vena cava is normal in size with greater than 50% respiratory variability, suggesting right atrial pressure of 3 mmHg. IAS/Shunts: The atrial septum is grossly normal.  LEFT VENTRICLE PLAX 2D LVIDd:         5.30 cm      Diastology LVIDs:         5.20 cm      LV e' medial:  4.35 cm/s LV PW:         1.50 cm      LV e' lateral: 7.07 cm/s LV IVS:        1.80 cm LVOT diam:     2.50 cm LV SV:         77 LV SV Index:   33 LVOT Area:     4.91 cm  LV Volumes (MOD) LV vol d, MOD A2C: 340.0 ml LV vol d, MOD A4C: 385.0 ml LV vol s, MOD A2C: 271.0 ml LV vol s, MOD A4C: 274.0 ml LV SV MOD A2C:     69.0 ml LV SV MOD A4C:     385.0 ml LV SV MOD BP:      89.0 ml  RIGHT VENTRICLE RV S prime:     11.90 cm/s TAPSE (M-mode): 1.8 cm LEFT ATRIUM            Index LA Vol (A2C): 115.0 ml 50.12 ml/m LA Vol (A4C): 65.4 ml  28.50 ml/m  AORTIC VALVE AV Area (Vmax):    2.41 cm AV Area (Vmean):   1.89 cm AV Area (VTI):     2.22 cm AV Vmax:           195.00 cm/s AV Vmean:          120.000 cm/s AV VTI:            0.345 m AV Peak Grad:      15.2 mmHg AV Mean Grad:      7.0 mmHg LVOT Vmax:         95.60 cm/s LVOT Vmean:        46.300 cm/s LVOT VTI:          0.156 m LVOT/AV VTI ratio: 0.45  AORTA Ao Asc diam: 3.40 cm  SHUNTS Systemic VTI:  0.16 m Systemic Diam: 2.50 cm Pasqual Bone MD Electronically signed by Pasqual Bone MD Signature Date/Time: 10/12/2023/5:48:13 PM    Final    MR BRAIN WO CONTRAST Result Date: 10/12/2023 CLINICAL DATA:  Neuro deficit with stroke suspected. Chronic cervical myelopathy. EXAM: MRI HEAD WITHOUT CONTRAST MRI CERVICAL SPINE WITHOUT CONTRAST TECHNIQUE: Multiplanar, multiecho pulse sequences of the brain and surrounding structures, and cervical spine, to include the craniocervical junction and cervicothoracic junction, were obtained without intravenous contrast. COMPARISON:  Brain MRI 04/08/2021 FINDINGS: MRI HEAD FINDINGS Brain: No acute infarction, hemorrhage, hydrocephalus, extra-axial collection or mass lesion. Small FLAIR hyperintensities scattered in the cerebral white matter attributed to chronic small vessel ischemia with lacunes. Chronic lacunar infarcts in the bilateral pons. Mild for age cerebral volume loss which is generalized. Vascular: Normal flow voids. Skull  and upper cervical spine: Normal marrow signal Sinuses/Orbits: Unremarkable MRI CERVICAL SPINE FINDINGS Alignment: Reversal of cervical lordosis. Vertebrae: No fracture, evidence of discitis, or bone lesion. Fatty degenerative marrow conversion at multiple endplates. Cord: Normal signal and morphology when allowing for motion artifact Posterior Fossa, vertebral arteries, paraspinal  tissues: No perispinal mass or inflammation Disc levels: C2-3: Degenerative facet spurring and mild disc narrowing. No neural impingement C3-4: Disc collapse with endplate and uncovertebral ridging. Degenerative facet spurring which is moderate on the left. At least moderate left foraminal narrowing C4-5: Disc collapse with endplate and uncovertebral ridging. Estimate moderate bilateral foraminal narrowing. Patent spinal canal. C5-6: Disc narrowing and endplate degeneration with uncovertebral and facet spurring. Estimate mild spinal stenosis and mild bilateral foraminal narrowing. C6-7: Disc narrowing and bulging with endplate and uncovertebral ridging. There is facet spurring and ligamentum flavum thickening. Estimate moderate spinal stenosis with midline canal diameter of 7 mm. Advanced right foraminal stenosis. C7-T1:Degenerative facet spurring to a moderate degree. Mild disc bulging. IMPRESSION: Brain MRI: No acute finding. 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 Cervical spine: Moderate motion artifact. Generalized degeneration with up to moderate spinal stenosis at C6-7. Multilevel foraminal narrowing which appears focally advanced at C6-7 on the right. Electronically Signed   By: Ronnette Coke M.D.   On: 10/12/2023 05:10   MR CERVICAL SPINE WO CONTRAST Result Date: 10/12/2023 CLINICAL DATA:  Neuro deficit with stroke suspected. Chronic cervical myelopathy. EXAM: MRI HEAD WITHOUT CONTRAST MRI CERVICAL SPINE WITHOUT CONTRAST TECHNIQUE: Multiplanar, multiecho pulse sequences of the brain and surrounding structures, and cervical spine, to include the craniocervical junction and cervicothoracic junction, were obtained without intravenous contrast. COMPARISON:  Brain MRI 04/08/2021 FINDINGS: MRI HEAD FINDINGS Brain: No acute infarction, hemorrhage, hydrocephalus, extra-axial collection or mass lesion. Small FLAIR hyperintensities scattered in the cerebral white matter attributed to chronic  small vessel ischemia with lacunes. Chronic lacunar infarcts in the bilateral pons. Mild for age cerebral volume loss which is generalized. Vascular: Normal flow voids. Skull and upper cervical spine: Normal marrow signal Sinuses/Orbits: Unremarkable MRI CERVICAL SPINE FINDINGS Alignment: Reversal of cervical lordosis. Vertebrae: No fracture, evidence of discitis, or bone lesion. Fatty degenerative marrow conversion at multiple endplates. Cord: Normal signal and morphology when allowing for motion artifact Posterior Fossa, vertebral arteries, paraspinal tissues: No perispinal mass or inflammation Disc levels: C2-3: Degenerative facet spurring and mild disc narrowing. No neural impingement C3-4: Disc collapse with endplate and uncovertebral ridging. Degenerative facet spurring which is moderate on the left. At least moderate left foraminal narrowing C4-5: Disc collapse with endplate and uncovertebral ridging. Estimate moderate bilateral foraminal narrowing. Patent spinal canal. C5-6: Disc narrowing and endplate degeneration with uncovertebral and facet spurring. Estimate mild spinal stenosis and mild bilateral foraminal narrowing. C6-7: Disc narrowing and bulging with endplate and uncovertebral ridging. There is facet spurring and ligamentum flavum thickening. Estimate moderate spinal stenosis with midline canal diameter of 7 mm. Advanced right foraminal stenosis. C7-T1:Degenerative facet spurring to a moderate degree. Mild disc bulging. IMPRESSION: Brain MRI: No acute finding. 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 Cervical spine: Moderate motion artifact. Generalized degeneration with up to moderate spinal stenosis at C6-7. Multilevel foraminal narrowing which appears focally advanced at C6-7 on the right. Electronically Signed   By: Ronnette Coke M.D.   On: 10/12/2023 05:10   CT Head Wo Contrast Result Date: 10/11/2023 CLINICAL DATA:  Provided history: Vision loss, monocular. Additional  history provided: The patient reports double vision and weakness. EXAM: CT HEAD WITHOUT  CONTRAST TECHNIQUE: Contiguous axial images were obtained from the base of the skull through the vertex without intravenous contrast. RADIATION DOSE REDUCTION: This exam was performed according to the departmental dose-optimization program which includes automated exposure control, adjustment of the mA and/or kV according to patient size and/or use of iterative reconstruction technique. COMPARISON:  Brain MRI 04/08/2021. FINDINGS: Brain: Mild generalized cerebral atrophy. Unchanged chronic lacunar infarcts within the left lentiform nucleus/retrolenticular white matter and within the pons. Multifocal T2 FLAIR hyperintense signal abnormality elsewhere within the cerebral white matter, nonspecific but compatible with mild chronic small vessel ischemic disease. There is no acute intracranial hemorrhage. No demarcated cortical infarct. No extra-axial fluid collection. No evidence of an intracranial mass. No midline shift. Vascular: No hyperdense vessel.  Atherosclerotic calcifications. Skull: No calvarial fracture or aggressive osseous lesion. Sinuses/Orbits: No mass or acute finding within the imaged orbits. No significant paranasal sinus disease. IMPRESSION: 1. No acute intracranial finding. 2. Unchanged chronic lacunar infarcts within the left lentiform nucleus and adjacent white matter, and within the pons. 3. Background mild cerebral white matter chronic small vessel ischemic disease. 4. Mild generalized cerebral atrophy. Electronically Signed   By: Bascom Lily D.O.   On: 10/11/2023 14:29     Time coordinating discharge: Over 30 minutes    Faith Homes, MD  [facility] 10/13/2023, 4:55 PM

## 2023-10-13 NOTE — Progress Notes (Addendum)
 [facility] Progress Note   Subjective:  Patient seen in room today. He had HD overnight and reports no complications. BP is stable this morning. His K is 3.9, which is much improved from yesterday. He denies any dyspnea, CP, or abdominal pain this morning. Plan to d/c patient home today.   Objective Vitals:   10/13/23 0402 10/13/23 0500 10/13/23 0721 10/13/23 1123  BP: (!) 130/53  (!) 94/54 117/87  Pulse: 82  81 80  Resp: 18  18 18   Temp: 98 F (36.7 C)  98 F (36.7 C) 97.8 F (36.6 C)  TempSrc:   Oral Oral  SpO2: 98%  96% 96%  Weight:  107.9 kg    Height:       Physical Exam General: adult male, in bed, no acute distress Heart:RRR, no MGR appreciated Lungs: CTA bilaterally, no IWOB, no crackles, wheeze, or rales. Abdomen: soft, nontender and nondistended Extremities: no pitting edema on LE Dialysis Access: LUE AVF +b/t with healing lesions on arm  Additional Objective Labs: Basic Metabolic Panel: Recent Labs  Lab 10/11/23 1225 10/12/23 0512 10/13/23 0300  NA 136 135 134*  K 5.1 6.2* 3.9  CL 94* 96* 95*  CO2 28 28 28   GLUCOSE 94 95 95  BUN 25* 30* 15  CREATININE 7.66* 9.14* 4.77*  CALCIUM  9.7 8.5* 9.3  PHOS 5.3* 7.2* 4.5   Liver Function Tests: Recent Labs  Lab 10/11/23 1225 10/12/23 0512 10/13/23 0300  AST 12*  --   --   ALT 6  --   --   ALKPHOS 116  --   --   BILITOT 0.6  --   --   PROT 8.2*  --   --   ALBUMIN  3.9 3.1* 3.3*   No results for input(s): "LIPASE", "AMYLASE" in the last 168 hours. CBC: Recent Labs  Lab 10/11/23 1225 10/12/23 0512 10/13/23 0300  WBC 4.9 5.3 4.6  NEUTROABS  --   --  2.9  HGB 11.6* 11.2* 9.6*  HCT 36.9* 34.8* 29.9*  MCV 93.9 92.1 92.6  PLT 218 233 220   Blood Culture    Component Value Date/Time   SDES BLOOD RIGHT ANTECUBITAL 05/24/2022 2251   SPECREQUEST  05/24/2022 2251    BOTTLES DRAWN AEROBIC AND ANAEROBIC Blood Culture results may not be optimal due to an inadequate volume of blood  received in culture bottles   CULT  05/24/2022 2251    NO GROWTH 6 DAYS Performed at [facility], [address], [place], [place] [address]    REPTSTATUS 05/30/2022 FINAL 05/24/2022 2251    Cardiac Enzymes: No results for input(s): "CKTOTAL", "CKMB", "CKMBINDEX", "TROPONINI" in the last 168 hours. CBG: Recent Labs  Lab 10/12/23 1212 10/12/23 2052 10/12/23 2224 10/13/23 0835 10/13/23 1120  GLUCAP 74 63* 82 95 64*   Iron  Studies: No results for input(s): "IRON ", "TIBC", "TRANSFERRIN", "FERRITIN" in the last 72 hours. @lablastinr3 @ Studies/Results: CT ANGIO HEAD NECK W WO CM Result Date: 10/12/2023 EXAM: CTA Head and Neck with Intravenous Contrast. CT Head without Contrast. CLINICAL HISTORY: Stroke/TIA, determine embolic source. Chief complaints; Visual Field Change; CT ANGIO HEAD NECK W WO CM; Stroke/TIA, determine embolic source; chronic right eye blurry vision, CAD, DMII, diverticulitis, DVT, GERD, HTN, gout who presented with left eye vision changes. He had noticed vision changes occurring upon waking up on 10/08/2023. TECHNIQUE: Axial CTA images of the head and neck performed with intravenous contrast. MIP reconstructed images were created and reviewed. Axial computed tomography  images of the head/brain performed without intravenous contrast. Note: Per PQRS, the description of internal carotid artery percent stenosis, including 0 percent or normal exam, is based on [place] American Symptomatic Carotid Endarterectomy Trial (NASCET) criteria. Dose reduction technique was used including one or more of the following: automated exposure control, adjustment of mA and kV according to patient size, and/or iterative reconstruction. CONTRAST: With and without; 75mL (iohexol  (OMNIPAQUE ) 350 MG/ML injection 75 mL IOHEXOL  350 MG/ML SOLN) COMPARISON: CT head without contrast 10/11/2023 and MR head without contrast 10/12/2023. FINDINGS: CT HEAD: BRAIN: No acute intraparenchymal hemorrhage. No mass  lesion. No CT evidence for acute territorial infarct. Remote lacunar infarct is again noted in the posterior left lentiform nucleus. Atrophy and scattered subcortical T2 hyperintensities are mildly advanced for age. VENTRICLES: No hydrocephalus. ORBITS: The orbits are unremarkable. SINUSES AND MASTOIDS: The paranasal sinuses and mastoid air cells are clear. CTA NECK: Atherosclerotic changes are present at the aortic arch and great vessel origins without focal stenosis or aneurysm. COMMON CAROTID ARTERIES: Right common carotid artery is within normal limits. Atherosclerotic calcifications are present at the right carotid bifurcation without focal stenosis. Faint calcifications are present at the left carotid bifurcation without significant stenosis. INTERNAL CAROTID ARTERIES: Atherosclerotic changes are present within the cavernous internal carotid arteries bilaterally without a significant stenosis through the ICA termini. VERTEBRAL ARTERIES: The left vertebral artery is dominant. Moderate stenosis is present at the origin of the right vertebral artery. CTA HEAD: ANTERIOR CEREBRAL ARTERIES: The A1 segments and the ACA branch vessels are normal. MIDDLE CEREBRAL ARTERIES: The M1 segments and  the MCA branch vessels are normal. POSTERIOR CEREBRAL ARTERIES: The left posterior cerebral artery is of fetal type. Mild narrowing is present in the distal left P2 segment. BASILAR ARTERY: Atherosclerotic calcifications are present in the left V4 segment without focal stenosis. SOFT TISSUES: No acute finding. No masses or lymphadenopathy. BONES: No acute osseous abnormality. Multilevel degenerative changes are present in the cervical spine. No focal osseous lesions are present. Bilateral shoulder arthroplasty is noted. IMPRESSION: 1. No acute intracranial findings related to the clinical history of stroke/TIA. 2. Atherosclerotic changes in the aortic arch, great vessel origins, and carotid bifurcations without significant  stenosis. 3. Moderate stenosis at the origin of the right vertebral artery. 4. Mild narrowing in the distal left P2 segment. Electronically signed by: Audree Leas MD 10/12/2023 09:28 PM EDT RP Workstation: [id number]   ECHOCARDIOGRAM COMPLETE Result Date: 10/12/2023    ECHOCARDIOGRAM REPORT   Patient Name:   RAYAAN GARGUILO Salva Date of Exam: 10/12/2023 Medical Rec #:  [id number]     Height:       73.0 in Accession #:    [id number]    Weight:       232.7 lb Date of Birth:  09-Mar-1961     BSA:          2.295 m Patient Age:    62 years      BP:           139/82 mmHg Patient Gender: M             HR:           79 bpm. Exam Location:  Inpatient Procedure: 2D Echo, Cardiac Doppler and Color Doppler (Both Spectral and Color            Flow Doppler were utilized during procedure). Indications:     Stroke  History:         Patient has prior history of  Echocardiogram examinations, most                  recent 02/24/2022.  Sonographer:     Andrena Bang Referring Phys:  [id number] DENISE A WOLFE Diagnosing Phys: Pasqual Bone MD IMPRESSIONS  1. Left ventricular ejection fraction, by estimation, is 25 to 30%. The left ventricle has severely decreased function. The left ventricle demonstrates global hypokinesis. The left ventricular internal cavity size was mildly dilated. There is moderate concentric left ventricular hypertrophy. Left ventricular diastolic parameters are consistent with Grade I diastolic dysfunction (impaired relaxation).  2. Right ventricular systolic function is low normal. The right ventricular size is normal.  3. Left atrial size was severely dilated.  4. Right atrial size was mildly dilated.  5. The mitral valve is degenerative. Mild mitral valve regurgitation.  6. The aortic valve is tricuspid. There is moderate calcification of the aortic valve. There is mild thickening of the aortic valve. Aortic valve regurgitation is trivial. Aortic valve sclerosis/calcification is present, without any evidence of aortic  stenosis.  7. There is mild (Grade II) atheroma plaque involving the aortic root and ascending aorta.  8. The inferior vena cava is normal in size with greater than 50% respiratory variability, suggesting right atrial pressure of 3 mmHg. 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 FINDINGS  Left Ventricle: Left ventricular ejection fraction, by estimation, is 25 to 30%. The left ventricle has severely decreased function. The left ventricle demonstrates global hypokinesis. Definity contrast agent was given IV to delineate the left ventricular endocardial borders. The left ventricular internal cavity size was mildly dilated. There is moderate concentric left ventricular hypertrophy. Left ventricular diastolic parameters are consistent with Grade I diastolic dysfunction (impaired relaxation). Right Ventricle: The right ventricular size is normal. No increase in right ventricular wall thickness. Right ventricular systolic function is low normal. Left Atrium: Left atrial size was severely dilated. Right Atrium: Right atrial size was mildly dilated. Pericardium: There is no evidence of pericardial effusion. Mitral Valve: The mitral valve is degenerative in appearance. There is mild thickening of the mitral valve leaflet(s). There is mild calcification of the posterior mitral valve leaflet(s). Mild mitral annular calcification. Mild mitral valve regurgitation. Tricuspid Valve: The tricuspid valve is normal in structure. Tricuspid valve regurgitation is trivial. Aortic Valve: The aortic valve is tricuspid. There is moderate calcification of the aortic valve. There is mild thickening of the aortic valve. There is mild to moderate aortic valve annular calcification. Aortic valve regurgitation is trivial. Aortic valve sclerosis/calcification is present, without any evidence of aortic stenosis. Aortic valve mean gradient measures 7.0 mmHg. Aortic valve peak  gradient measures 15.2 mmHg. Aortic valve area, by VTI measures 2.22 cm. Pulmonic Valve: The pulmonic valve was normal in structure. Pulmonic valve regurgitation is not visualized. Aorta: The aortic root is normal in size and structure. There is mild (Grade II) atheroma plaque involving the aortic root and ascending aorta. Venous: The inferior vena cava is normal in size with greater than 50% respiratory variability, suggesting right atrial pressure of 3 mmHg. IAS/Shunts: The atrial septum is grossly normal.  LEFT VENTRICLE PLAX 2D LVIDd:         5.30 cm      Diastology LVIDs:         5.20 cm      LV e' medial:  4.35 cm/s LV PW:         1.50 cm      LV e' lateral: 7.07 cm/s LV IVS:  1.80 cm LVOT diam:     2.50 cm LV SV:         77 LV SV Index:   33 LVOT Area:     4.91 cm  LV Volumes (MOD) LV vol d, MOD A2C: 340.0 ml LV vol d, MOD A4C: 385.0 ml LV vol s, MOD A2C: 271.0 ml LV vol s, MOD A4C: 274.0 ml LV SV MOD A2C:     69.0 ml LV SV MOD A4C:     385.0 ml LV SV MOD BP:      89.0 ml RIGHT VENTRICLE RV S prime:     11.90 cm/s TAPSE (M-mode): 1.8 cm LEFT ATRIUM            Index LA Vol (A2C): 115.0 ml 50.12 ml/m LA Vol (A4C): 65.4 ml  28.50 ml/m  AORTIC VALVE AV Area (Vmax):    2.41 cm AV Area (Vmean):   1.89 cm AV Area (VTI):     2.22 cm AV Vmax:           195.00 cm/s AV Vmean:          120.000 cm/s AV VTI:            0.345 m AV Peak Grad:      15.2 mmHg AV Mean Grad:      7.0 mmHg LVOT Vmax:         95.60 cm/s LVOT Vmean:        46.300 cm/s LVOT VTI:          0.156 m LVOT/AV VTI ratio: 0.45  AORTA Ao Asc diam: 3.40 cm  SHUNTS Systemic VTI:  0.16 m Systemic Diam: 2.50 cm Pasqual Bone MD Electronically signed by Pasqual Bone MD Signature Date/Time: 10/12/2023/5:48:13 PM    Final    MR BRAIN WO CONTRAST Result Date: 10/12/2023 CLINICAL DATA:  Neuro deficit with stroke suspected. Chronic cervical myelopathy. EXAM: MRI HEAD WITHOUT CONTRAST MRI CERVICAL SPINE WITHOUT CONTRAST TECHNIQUE: Multiplanar, multiecho  pulse sequences of the brain and surrounding structures, and cervical spine, to include the craniocervical junction and cervicothoracic junction, were obtained without intravenous contrast. COMPARISON:  Brain MRI 04/08/2021 FINDINGS: MRI HEAD FINDINGS Brain: No acute infarction, hemorrhage, hydrocephalus, extra-axial collection or mass lesion. Small FLAIR hyperintensities scattered in the cerebral white matter attributed to chronic small vessel ischemia with lacunes. Chronic lacunar infarcts in the bilateral pons. Mild for age cerebral volume loss which is generalized. Vascular: Normal flow voids. Skull and upper cervical spine: Normal marrow signal Sinuses/Orbits: Unremarkable MRI CERVICAL SPINE FINDINGS Alignment: Reversal of cervical lordosis. Vertebrae: No fracture, evidence of discitis, or bone lesion. Fatty degenerative marrow conversion at multiple endplates. Cord: Normal signal and morphology when allowing for motion artifact Posterior Fossa, vertebral arteries, paraspinal tissues: No perispinal mass or inflammation Disc levels: C2-3: Degenerative facet spurring and mild disc narrowing. No neural impingement C3-4: Disc collapse with endplate and uncovertebral ridging. Degenerative facet spurring which is moderate on the left. At least moderate left foraminal narrowing C4-5: Disc collapse with endplate and uncovertebral ridging. Estimate moderate bilateral foraminal narrowing. Patent spinal canal. C5-6: Disc narrowing and endplate degeneration with uncovertebral and facet spurring. Estimate mild spinal stenosis and mild bilateral foraminal narrowing. C6-7: Disc narrowing and bulging with endplate and uncovertebral ridging. There is facet spurring and ligamentum flavum thickening. Estimate moderate spinal stenosis with midline canal diameter of 7 mm. Advanced right foraminal stenosis. C7-T1:Degenerative facet spurring to a moderate degree. Mild disc bulging. IMPRESSION: Brain MRI: No acute finding. Chronic  small vessel disease with chronic lacunar infarcts. Cervical spine: Moderate motion artifact. Generalized degeneration with up to moderate spinal stenosis at C6-7. Multilevel foraminal narrowing which appears focally advanced at C6-7 on the right. Electronically Signed   By: Ronnette Coke M.D.   On: 10/12/2023 05:10   MR CERVICAL SPINE WO CONTRAST Result Date: 10/12/2023 CLINICAL DATA:  Neuro deficit with stroke suspected. Chronic cervical myelopathy. EXAM: MRI HEAD WITHOUT CONTRAST MRI CERVICAL SPINE WITHOUT CONTRAST TECHNIQUE: Multiplanar, multiecho pulse sequences of the brain and surrounding structures, and cervical spine, to include the craniocervical junction and cervicothoracic junction, were obtained without intravenous contrast. COMPARISON:  Brain MRI 04/08/2021 FINDINGS: MRI HEAD FINDINGS Brain: No acute infarction, hemorrhage, hydrocephalus, extra-axial collection or mass lesion. Small FLAIR hyperintensities scattered in the cerebral white matter attributed to chronic small vessel ischemia with lacunes. Chronic lacunar infarcts in the bilateral pons. Mild for age cerebral volume loss which is generalized. Vascular: Normal flow voids. Skull and upper cervical spine: Normal marrow signal Sinuses/Orbits: Unremarkable MRI CERVICAL SPINE FINDINGS Alignment: Reversal of cervical lordosis. Vertebrae: No fracture, evidence of discitis, or bone lesion. Fatty degenerative marrow conversion at multiple endplates. Cord: Normal signal and morphology when allowing for motion artifact Posterior Fossa, vertebral arteries, paraspinal tissues: No perispinal mass or inflammation Disc levels: C2-3: Degenerative facet spurring and mild disc narrowing. No neural impingement C3-4: Disc collapse with endplate and uncovertebral ridging. Degenerative facet spurring which is moderate on the left. At least moderate left foraminal narrowing C4-5: Disc collapse with endplate and uncovertebral ridging. Estimate moderate bilateral  foraminal narrowing. Patent spinal canal. C5-6: Disc narrowing and endplate degeneration with uncovertebral and facet spurring. Estimate mild spinal stenosis and mild bilateral foraminal narrowing. C6-7: Disc narrowing and bulging with endplate and uncovertebral ridging. There is facet spurring and ligamentum flavum thickening. Estimate moderate spinal stenosis with midline canal diameter of 7 mm. Advanced right foraminal stenosis. C7-T1:Degenerative facet spurring to a moderate degree. Mild disc bulging. IMPRESSION: Brain MRI: No acute finding. 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 Cervical spine: Moderate motion artifact. Generalized degeneration with up to moderate spinal stenosis at C6-7. Multilevel foraminal narrowing which appears focally advanced at C6-7 on the right. Electronically Signed   By: Ronnette Coke M.D.   On: 10/12/2023 05:10   Medications:   atorvastatin   40 mg Oral Daily   Chlorhexidine  Gluconate Cloth  6 each Topical Q0600   [START ON 10/14/2023] digoxin   0.125 mg Oral Q M,W,F   [START ON 10/14/2023] doxercalciferol   7 mcg Intravenous Q M,W,F-HD   gabapentin   300 mg Oral QPM   midodrine   10 mg Oral TID WC   pantoprazole   40 mg Oral BID AC   warfarin  12.5 mg Oral ONCE-1600   Warfarin - Pharmacist Dosing Inpatient   Does not apply q1600    Dialysis Orders:  [facility]; MWF; 4 hrs Qb 450/ DFR 600 2K/ 2.5 Ca Heparin  5000 U with 2000 U midrun bolus EDW 103.7 kg Mircera 30 mcg q 2 weeks (last dose 09/30/23) Hectorol  7 mcg 3 x week at HD Parsabiv 15 mg 3 x week at HD   Assessment/Plan:  Vision change: reported painless vision loss in left eye over 1 week ago. Code stroke called but imaging ruled out stroke. 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 Recommended to f/u with retina specialist.  ESRD:  patient on HD MWF at [facility]. No seen missed treatments and patient reports good compliance with treatment time and fluid restrictions. Last  HD was  10/12/23.  Hyperkalemia: Patient's K was 6.2 on admission. One Dose of Lokelma  given yesterday. K 3.9, much improved Atrial Fib: on Warfarin for anticoagulation. Per primary and pharm.   Hypertension/volume: BP at goal today. Patient normally takes midodrine   Anemia: Hgb 9.6 today. Patient received last ESA on 09/30/23.   Metabolic bone disease: Cor Ca 9.2. Phos 4.5, at goal.   Nutrition:  Albumin  3.3. On renal diet with fluid restrictions   Hersey Lorenzo, NP 10/13/2023, 2:26 PM  [facility]    Seen and examined independently.  Agree with note and exam as documented above by physician extender and as noted here.  Spoke with patient and his wife at bedside.  Feels ok today.  He states that he is glad to be going home.    General adult male in bed in no acute distress HEENT normocephalic atraumatic extraocular movements intact sclera anicteric Neck supple trachea midline Lungs clear to auscultation bilaterally normal work of breathing at rest on room air  Heart S1S2 no rub Abdomen soft nontender nondistended Extremities no pitting edema  Psych normal mood and affect LUE AVF with healing lesions on arm with bruit and thrill    Vision change - per primary team and ophthalmology.  Imaging not suggestive of stroke    ESRD - on HD MWF schedule   Hyperkalemia  -improved s/p lokelma  and HD   Afib - noted.  Rate control per primary team    Chronic hypotension - on midodrine     Anemia of CKD - recent ESA   Nan Aver, MD 10/13/2023  2:48 PM

## 2023-10-13 NOTE — Telephone Encounter (Signed)
 Pharmacy Patient Advocate Encounter  Insurance verification completed.    The patient is insured through [employer]. Patient has Medicare and is not eligible for a copay card, but may be able to apply for patient assistance or Medicare RX Payment Plan (Patient Must reach out to their plan, if eligible for payment plan), if available.    Ran test claim for ELIQUIS 5MG  TABLETS and the current 30 day co-pay is $445.77.  Ran test claim for XARELTO 10MG  TABLETS and the current 30 day co-pay is $445.77.  Copay applied to the deductible of $450.00. $47.00 after the deductible.  This test claim was processed through [facility]- copay amounts may vary at other pharmacies due to pharmacy/plan contracts, or as the patient moves through the different stages of their insurance plan.

## 2023-10-14 ENCOUNTER — Telehealth: Payer: Self-pay

## 2023-10-14 DIAGNOSIS — N2581 Secondary hyperparathyroidism of renal origin: Secondary | ICD-10-CM | POA: Diagnosis not present

## 2023-10-14 DIAGNOSIS — Z7901 Long term (current) use of anticoagulants: Secondary | ICD-10-CM | POA: Diagnosis not present

## 2023-10-14 DIAGNOSIS — I480 Paroxysmal atrial fibrillation: Secondary | ICD-10-CM | POA: Diagnosis not present

## 2023-10-14 DIAGNOSIS — N186 End stage renal disease: Secondary | ICD-10-CM | POA: Diagnosis not present

## 2023-10-14 DIAGNOSIS — Z992 Dependence on renal dialysis: Secondary | ICD-10-CM | POA: Diagnosis not present

## 2023-10-14 LAB — CARDIOLIPIN ANTIBODIES, IGG, IGM, IGA
Anticardiolipin IgA: <9 U/mL (ref 0–11)
Anticardiolipin IgG: <9 GPL U/mL (ref 0–14)
Anticardiolipin IgM: 9 [MPL'U]/mL (ref 0–12)

## 2023-10-14 NOTE — Transitions of Care (Post Inpatient/ED Visit) (Signed)
   10/14/2023  Name: ARMANDO BUKHARI MRN: [id number] DOB: [birth year]  Today's TOC FU Call Status: Today's TOC FU Call Status:: Unsuccessful Call (1st Attempt) Unsuccessful Call (1st Attempt) Date: 10/14/23  Attempted to reach the patient regarding the most recent Inpatient/ED visit.  Follow Up Plan: Additional outreach attempts will be made to reach the patient to complete the Transitions of Care (Post Inpatient/ED visit) call.   Khaleef Ruby J. Allayna Erlich RN, MSN [facility], [facility] RN Care Manager Direct Dial : [phone]  Fax: [phone] Website: [email or web address]

## 2023-10-15 LAB — ENA+DNA/DS+ANTICH+CENTRO+JO...
Anti JO-1: <0.2 AI (ref 0.0–0.9)
Centromere Ab Screen: <0.2 AI (ref 0.0–0.9)
Chromatin Ab SerPl-aCnc: <0.2 AI (ref 0.0–0.9)
ENA SM Ab Ser-aCnc: <0.2 AI (ref 0.0–0.9)
Ribonucleic Protein: <0.2 AI (ref 0.0–0.9)
SSA (Ro) (ENA) Antibody, IgG: 3.8 AI — ABNORMAL HIGH (ref 0.0–0.9)
SSB (La) (ENA) Antibody, IgG: <0.2 AI (ref 0.0–0.9)
Scleroderma (Scl-70) (ENA) Antibody, IgG: <0.2 AI (ref 0.0–0.9)
ds DNA Ab: <1 [IU]/mL (ref 0–9)

## 2023-10-15 LAB — ANA W/REFLEX IF POSITIVE: Anti Nuclear Antibody (ANA): POSITIVE — AB

## 2023-10-15 LAB — HEPATITIS B SURFACE ANTIBODY, QUANTITATIVE: Hep B S AB Quant (Post): <3.5 m[IU]/mL — ABNORMAL LOW

## 2023-10-16 LAB — VITAMIN B1: Vitamin B1 (Thiamine): 104.2 nmol/L (ref 66.5–200.0)

## 2023-10-17 ENCOUNTER — Ambulatory Visit: Payer: Self-pay | Admitting: Neurology

## 2023-10-17 DIAGNOSIS — Z992 Dependence on renal dialysis: Secondary | ICD-10-CM | POA: Diagnosis not present

## 2023-10-17 DIAGNOSIS — N186 End stage renal disease: Secondary | ICD-10-CM | POA: Diagnosis not present

## 2023-10-17 DIAGNOSIS — N2581 Secondary hyperparathyroidism of renal origin: Secondary | ICD-10-CM | POA: Diagnosis not present

## 2023-10-17 NOTE — Progress Notes (Signed)
 Kindly inform the pt that 1 of the blood tests for sjogrens syndrome which is an autoimmune condition is abnormal and he needs to see PCP for further advice

## 2023-10-18 LAB — VITAMIN B6

## 2023-10-19 DIAGNOSIS — N186 End stage renal disease: Secondary | ICD-10-CM | POA: Diagnosis not present

## 2023-10-19 DIAGNOSIS — Z992 Dependence on renal dialysis: Secondary | ICD-10-CM | POA: Diagnosis not present

## 2023-10-19 DIAGNOSIS — N2581 Secondary hyperparathyroidism of renal origin: Secondary | ICD-10-CM | POA: Diagnosis not present

## 2023-10-20 ENCOUNTER — Telehealth: Payer: Self-pay

## 2023-10-20 NOTE — Telephone Encounter (Signed)
 Copied from CRM [id number]. Topic: Referral - Request for Referral >> Oct 19, 2023  3:44 PM Danna Duster wrote: Did the patient discuss referral with their provider in the last year? No (If No - schedule appointment) (If Yes - send message)  Appointment offered? Yes, appointment offered with PCP  Type of order/referral and detailed reason for visit: a referral to ophthalmology re: left eye, a blood vessel has burst behind  Preference of office, provider, location: [facility] was recommended to them  If referral order, have you been seen by this specialty before? No (If Yes, this issue or another issue? When? Where?   Will discuss referral at patients next visit.  Can we respond through MyChart? No, please call phone number: [phone], to talk with wife Cory Kitt

## 2023-10-20 NOTE — Transitions of Care (Post Inpatient/ED Visit) (Signed)
   10/20/2023  Name: [name] MRN: [id number] DOB: 11-29-1960  Today's TOC FU Call Status: Today's TOC FU Call Status:: Unsuccessful Call (2nd Attempt) Unsuccessful Call (2nd Attempt) Date: 10/20/23  Attempted to reach the patient regarding the most recent Inpatient/ED visit.  Follow Up Plan: Additional outreach attempts will be made to reach the patient to complete the Transitions of Care (Post Inpatient/ED visit) call.   Kaloni Bisaillon J. Aniello Christopoulos RN, MSN [place]  [facility], [facility] RN Care Manager Direct Dial : [phone]  Fax: [phone] Website: [email or web address]

## 2023-10-21 ENCOUNTER — Telehealth: Payer: Self-pay

## 2023-10-21 DIAGNOSIS — N186 End stage renal disease: Secondary | ICD-10-CM | POA: Diagnosis not present

## 2023-10-21 DIAGNOSIS — Z992 Dependence on renal dialysis: Secondary | ICD-10-CM | POA: Diagnosis not present

## 2023-10-21 DIAGNOSIS — N2581 Secondary hyperparathyroidism of renal origin: Secondary | ICD-10-CM | POA: Diagnosis not present

## 2023-10-21 NOTE — Transitions of Care (Post Inpatient/ED Visit) (Signed)
   10/21/2023  Name: ALEM FAHL MRN: [id number] DOB: April 25, 1961  Today's TOC FU Call Status: Today's TOC FU Call Status:: Unsuccessful Call (3rd Attempt) Unsuccessful Call (3rd Attempt) Date: 10/21/23  Attempted to reach the patient regarding the most recent Inpatient/ED visit.  Follow Up Plan: No further outreach attempts will be made at this time. We have been unable to contact the patient.  Kinslee Dalpe J. Jaqlyn Gruenhagen RN, MSN [place]  [facility], [facility] Health RN Care Manager Direct Dial : [phone]  Fax: [phone] Website: [email or web address]

## 2023-10-22 DIAGNOSIS — I120 Hypertensive chronic kidney disease with stage 5 chronic kidney disease or end stage renal disease: Secondary | ICD-10-CM | POA: Diagnosis not present

## 2023-10-22 DIAGNOSIS — I483 Typical atrial flutter: Secondary | ICD-10-CM | POA: Diagnosis not present

## 2023-10-22 DIAGNOSIS — I5042 Chronic combined systolic (congestive) and diastolic (congestive) heart failure: Secondary | ICD-10-CM | POA: Diagnosis not present

## 2023-10-22 DIAGNOSIS — I132 Hypertensive heart and chronic kidney disease with heart failure and with stage 5 chronic kidney disease, or end stage renal disease: Secondary | ICD-10-CM | POA: Diagnosis not present

## 2023-10-22 DIAGNOSIS — M1030 Gout due to renal impairment, unspecified site: Secondary | ICD-10-CM | POA: Diagnosis not present

## 2023-10-22 DIAGNOSIS — I2510 Atherosclerotic heart disease of native coronary artery without angina pectoris: Secondary | ICD-10-CM | POA: Diagnosis not present

## 2023-10-22 DIAGNOSIS — E1122 Type 2 diabetes mellitus with diabetic chronic kidney disease: Secondary | ICD-10-CM | POA: Diagnosis not present

## 2023-10-22 DIAGNOSIS — K828 Other specified diseases of gallbladder: Secondary | ICD-10-CM | POA: Diagnosis not present

## 2023-10-22 DIAGNOSIS — N186 End stage renal disease: Secondary | ICD-10-CM | POA: Diagnosis not present

## 2023-10-22 DIAGNOSIS — Z992 Dependence on renal dialysis: Secondary | ICD-10-CM | POA: Diagnosis not present

## 2023-10-24 DIAGNOSIS — N186 End stage renal disease: Secondary | ICD-10-CM | POA: Diagnosis not present

## 2023-10-24 DIAGNOSIS — N2581 Secondary hyperparathyroidism of renal origin: Secondary | ICD-10-CM | POA: Diagnosis not present

## 2023-10-24 DIAGNOSIS — Z992 Dependence on renal dialysis: Secondary | ICD-10-CM | POA: Diagnosis not present

## 2023-10-25 DIAGNOSIS — I5042 Chronic combined systolic (congestive) and diastolic (congestive) heart failure: Secondary | ICD-10-CM | POA: Diagnosis not present

## 2023-10-25 DIAGNOSIS — E1122 Type 2 diabetes mellitus with diabetic chronic kidney disease: Secondary | ICD-10-CM | POA: Diagnosis not present

## 2023-10-25 DIAGNOSIS — M1030 Gout due to renal impairment, unspecified site: Secondary | ICD-10-CM | POA: Diagnosis not present

## 2023-10-25 DIAGNOSIS — I483 Typical atrial flutter: Secondary | ICD-10-CM | POA: Diagnosis not present

## 2023-10-25 DIAGNOSIS — K828 Other specified diseases of gallbladder: Secondary | ICD-10-CM | POA: Diagnosis not present

## 2023-10-25 DIAGNOSIS — I2510 Atherosclerotic heart disease of native coronary artery without angina pectoris: Secondary | ICD-10-CM | POA: Diagnosis not present

## 2023-10-25 DIAGNOSIS — N186 End stage renal disease: Secondary | ICD-10-CM | POA: Diagnosis not present

## 2023-10-25 DIAGNOSIS — I132 Hypertensive heart and chronic kidney disease with heart failure and with stage 5 chronic kidney disease, or end stage renal disease: Secondary | ICD-10-CM | POA: Diagnosis not present

## 2023-10-26 DIAGNOSIS — Z992 Dependence on renal dialysis: Secondary | ICD-10-CM | POA: Diagnosis not present

## 2023-10-26 DIAGNOSIS — N2581 Secondary hyperparathyroidism of renal origin: Secondary | ICD-10-CM | POA: Diagnosis not present

## 2023-10-26 DIAGNOSIS — N186 End stage renal disease: Secondary | ICD-10-CM | POA: Diagnosis not present

## 2023-10-27 DIAGNOSIS — I5042 Chronic combined systolic (congestive) and diastolic (congestive) heart failure: Secondary | ICD-10-CM | POA: Diagnosis not present

## 2023-10-27 DIAGNOSIS — I132 Hypertensive heart and chronic kidney disease with heart failure and with stage 5 chronic kidney disease, or end stage renal disease: Secondary | ICD-10-CM | POA: Diagnosis not present

## 2023-10-27 DIAGNOSIS — E1122 Type 2 diabetes mellitus with diabetic chronic kidney disease: Secondary | ICD-10-CM | POA: Diagnosis not present

## 2023-10-27 DIAGNOSIS — N186 End stage renal disease: Secondary | ICD-10-CM | POA: Diagnosis not present

## 2023-10-27 DIAGNOSIS — I483 Typical atrial flutter: Secondary | ICD-10-CM | POA: Diagnosis not present

## 2023-10-27 DIAGNOSIS — I2510 Atherosclerotic heart disease of native coronary artery without angina pectoris: Secondary | ICD-10-CM | POA: Diagnosis not present

## 2023-10-27 DIAGNOSIS — M1030 Gout due to renal impairment, unspecified site: Secondary | ICD-10-CM | POA: Diagnosis not present

## 2023-10-27 DIAGNOSIS — K828 Other specified diseases of gallbladder: Secondary | ICD-10-CM | POA: Diagnosis not present

## 2023-10-28 DIAGNOSIS — N186 End stage renal disease: Secondary | ICD-10-CM | POA: Diagnosis not present

## 2023-10-28 DIAGNOSIS — Z992 Dependence on renal dialysis: Secondary | ICD-10-CM | POA: Diagnosis not present

## 2023-10-28 DIAGNOSIS — N2581 Secondary hyperparathyroidism of renal origin: Secondary | ICD-10-CM | POA: Diagnosis not present

## 2023-10-28 NOTE — Telephone Encounter (Signed)
 Copied from CRM [id number]. Topic: Clinical - Prescription Issue >> Oct 28, 2023  8:37 AM Lotus Round B wrote: Reason for CRM: Leontine Rana from [facility] called in because she sent a rx request to fix patients power wheel chair and haven't received anything back . She said we have till monday 10/28/2023  to send the prescription back signed . She said if any questions to call her .   Direct number - [phone] ext [phone]   LVM FOR [place]

## 2023-10-31 DIAGNOSIS — N2581 Secondary hyperparathyroidism of renal origin: Secondary | ICD-10-CM | POA: Diagnosis not present

## 2023-10-31 DIAGNOSIS — Z992 Dependence on renal dialysis: Secondary | ICD-10-CM | POA: Diagnosis not present

## 2023-10-31 DIAGNOSIS — N186 End stage renal disease: Secondary | ICD-10-CM | POA: Diagnosis not present

## 2023-11-01 ENCOUNTER — Telehealth: Payer: Self-pay

## 2023-11-01 DIAGNOSIS — N186 End stage renal disease: Secondary | ICD-10-CM | POA: Diagnosis not present

## 2023-11-01 DIAGNOSIS — E1122 Type 2 diabetes mellitus with diabetic chronic kidney disease: Secondary | ICD-10-CM | POA: Diagnosis not present

## 2023-11-01 DIAGNOSIS — I132 Hypertensive heart and chronic kidney disease with heart failure and with stage 5 chronic kidney disease, or end stage renal disease: Secondary | ICD-10-CM | POA: Diagnosis not present

## 2023-11-01 DIAGNOSIS — K828 Other specified diseases of gallbladder: Secondary | ICD-10-CM | POA: Diagnosis not present

## 2023-11-01 DIAGNOSIS — M1030 Gout due to renal impairment, unspecified site: Secondary | ICD-10-CM | POA: Diagnosis not present

## 2023-11-01 DIAGNOSIS — I2510 Atherosclerotic heart disease of native coronary artery without angina pectoris: Secondary | ICD-10-CM | POA: Diagnosis not present

## 2023-11-01 DIAGNOSIS — I483 Typical atrial flutter: Secondary | ICD-10-CM | POA: Diagnosis not present

## 2023-11-01 DIAGNOSIS — I5042 Chronic combined systolic (congestive) and diastolic (congestive) heart failure: Secondary | ICD-10-CM | POA: Diagnosis not present

## 2023-11-01 NOTE — Telephone Encounter (Signed)
 Copied from CRM [id number]. Topic: Clinical - Medical Advice >> Nov 01, 2023 11:36 AM Tiffany B wrote: Reason for CRM: Checking on the status of add on disciplinary form for OT faxed on 6/6 . Caller will refax again to [phone], please notate when received. Order # [id number].  Called [place] and notified her that PCP is currently out of office.

## 2023-11-02 DIAGNOSIS — N186 End stage renal disease: Secondary | ICD-10-CM | POA: Diagnosis not present

## 2023-11-02 DIAGNOSIS — N2581 Secondary hyperparathyroidism of renal origin: Secondary | ICD-10-CM | POA: Diagnosis not present

## 2023-11-02 DIAGNOSIS — Z992 Dependence on renal dialysis: Secondary | ICD-10-CM | POA: Diagnosis not present

## 2023-11-03 DIAGNOSIS — Z4789 Encounter for other orthopedic aftercare: Secondary | ICD-10-CM | POA: Diagnosis not present

## 2023-11-03 DIAGNOSIS — M1030 Gout due to renal impairment, unspecified site: Secondary | ICD-10-CM | POA: Diagnosis not present

## 2023-11-03 DIAGNOSIS — N186 End stage renal disease: Secondary | ICD-10-CM | POA: Diagnosis not present

## 2023-11-03 DIAGNOSIS — Z993 Dependence on wheelchair: Secondary | ICD-10-CM | POA: Diagnosis not present

## 2023-11-03 DIAGNOSIS — G8929 Other chronic pain: Secondary | ICD-10-CM | POA: Diagnosis not present

## 2023-11-03 DIAGNOSIS — E1122 Type 2 diabetes mellitus with diabetic chronic kidney disease: Secondary | ICD-10-CM | POA: Diagnosis not present

## 2023-11-03 DIAGNOSIS — I132 Hypertensive heart and chronic kidney disease with heart failure and with stage 5 chronic kidney disease, or end stage renal disease: Secondary | ICD-10-CM | POA: Diagnosis not present

## 2023-11-03 DIAGNOSIS — I5042 Chronic combined systolic (congestive) and diastolic (congestive) heart failure: Secondary | ICD-10-CM | POA: Diagnosis not present

## 2023-11-03 DIAGNOSIS — I483 Typical atrial flutter: Secondary | ICD-10-CM | POA: Diagnosis not present

## 2023-11-03 DIAGNOSIS — I69354 Hemiplegia and hemiparesis following cerebral infarction affecting left non-dominant side: Secondary | ICD-10-CM | POA: Diagnosis not present

## 2023-11-03 DIAGNOSIS — K828 Other specified diseases of gallbladder: Secondary | ICD-10-CM | POA: Diagnosis not present

## 2023-11-03 DIAGNOSIS — M170 Bilateral primary osteoarthritis of knee: Secondary | ICD-10-CM | POA: Diagnosis not present

## 2023-11-03 DIAGNOSIS — I2510 Atherosclerotic heart disease of native coronary artery without angina pectoris: Secondary | ICD-10-CM | POA: Diagnosis not present

## 2023-11-03 DIAGNOSIS — Z96611 Presence of right artificial shoulder joint: Secondary | ICD-10-CM | POA: Diagnosis not present

## 2023-11-04 DIAGNOSIS — N186 End stage renal disease: Secondary | ICD-10-CM | POA: Diagnosis not present

## 2023-11-04 DIAGNOSIS — Z992 Dependence on renal dialysis: Secondary | ICD-10-CM | POA: Diagnosis not present

## 2023-11-04 DIAGNOSIS — N2581 Secondary hyperparathyroidism of renal origin: Secondary | ICD-10-CM | POA: Diagnosis not present

## 2023-11-07 DIAGNOSIS — Z992 Dependence on renal dialysis: Secondary | ICD-10-CM | POA: Diagnosis not present

## 2023-11-07 DIAGNOSIS — N2581 Secondary hyperparathyroidism of renal origin: Secondary | ICD-10-CM | POA: Diagnosis not present

## 2023-11-07 DIAGNOSIS — N186 End stage renal disease: Secondary | ICD-10-CM | POA: Diagnosis not present

## 2023-11-08 ENCOUNTER — Telehealth: Payer: Self-pay

## 2023-11-08 DIAGNOSIS — M1030 Gout due to renal impairment, unspecified site: Secondary | ICD-10-CM | POA: Diagnosis not present

## 2023-11-08 DIAGNOSIS — I2510 Atherosclerotic heart disease of native coronary artery without angina pectoris: Secondary | ICD-10-CM | POA: Diagnosis not present

## 2023-11-08 DIAGNOSIS — I132 Hypertensive heart and chronic kidney disease with heart failure and with stage 5 chronic kidney disease, or end stage renal disease: Secondary | ICD-10-CM | POA: Diagnosis not present

## 2023-11-08 DIAGNOSIS — K828 Other specified diseases of gallbladder: Secondary | ICD-10-CM | POA: Diagnosis not present

## 2023-11-08 DIAGNOSIS — I5042 Chronic combined systolic (congestive) and diastolic (congestive) heart failure: Secondary | ICD-10-CM | POA: Diagnosis not present

## 2023-11-08 DIAGNOSIS — I483 Typical atrial flutter: Secondary | ICD-10-CM | POA: Diagnosis not present

## 2023-11-08 DIAGNOSIS — E1122 Type 2 diabetes mellitus with diabetic chronic kidney disease: Secondary | ICD-10-CM | POA: Diagnosis not present

## 2023-11-08 DIAGNOSIS — N186 End stage renal disease: Secondary | ICD-10-CM | POA: Diagnosis not present

## 2023-11-08 NOTE — Telephone Encounter (Signed)
 Copied from CRM [id number]. Topic: Clinical - Home Health Verbal Orders >> Nov 08, 2023  2:10 PM [name] wrote: Caller/Agency: [name] from [facility] Callback Number: [phone] Order Number: [id number]  Calling for an update on orders sent via fax. [name] is requesting a callback, states was sent on 6/12.  LVM for [name]

## 2023-11-09 DIAGNOSIS — Z992 Dependence on renal dialysis: Secondary | ICD-10-CM | POA: Diagnosis not present

## 2023-11-09 DIAGNOSIS — N2581 Secondary hyperparathyroidism of renal origin: Secondary | ICD-10-CM | POA: Diagnosis not present

## 2023-11-09 DIAGNOSIS — N186 End stage renal disease: Secondary | ICD-10-CM | POA: Diagnosis not present

## 2023-11-10 ENCOUNTER — Ambulatory Visit: Payer: Medicare PPO | Admitting: Nurse Practitioner

## 2023-11-10 VITALS — BP 136/80 | HR 97 | Temp 98.3°F | Ht 73.0 in | Wt 236.8 lb

## 2023-11-10 DIAGNOSIS — I10 Essential (primary) hypertension: Secondary | ICD-10-CM

## 2023-11-10 DIAGNOSIS — H539 Unspecified visual disturbance: Secondary | ICD-10-CM

## 2023-11-10 DIAGNOSIS — Z992 Dependence on renal dialysis: Secondary | ICD-10-CM | POA: Diagnosis not present

## 2023-11-10 DIAGNOSIS — Z2821 Immunization not carried out because of patient refusal: Secondary | ICD-10-CM | POA: Diagnosis not present

## 2023-11-10 DIAGNOSIS — E1122 Type 2 diabetes mellitus with diabetic chronic kidney disease: Secondary | ICD-10-CM | POA: Diagnosis not present

## 2023-11-10 DIAGNOSIS — I120 Hypertensive chronic kidney disease with stage 5 chronic kidney disease or end stage renal disease: Secondary | ICD-10-CM | POA: Diagnosis not present

## 2023-11-10 DIAGNOSIS — N186 End stage renal disease: Secondary | ICD-10-CM | POA: Diagnosis not present

## 2023-11-10 DIAGNOSIS — E782 Mixed hyperlipidemia: Secondary | ICD-10-CM

## 2023-11-10 NOTE — Patient Instructions (Addendum)
 Call in one week if you have not heard from the ophthalmologist.

## 2023-11-10 NOTE — Assessment & Plan Note (Addendum)
 During hospitalization imaging showed chronic lacunar infarcts, will refer to Dr Elner at the patients request

## 2023-11-10 NOTE — Progress Notes (Signed)
 LILLETTE [name], CMA,acting as a Neurosurgeon for Matthew Ada, FNP.,have documented all relevant documentation on the behalf of Matthew Ada, FNP,as directed by  Matthew Ada, FNP while in the presence of Matthew Ada, FNP.  Subjective:  Patient ID: [name] , male    DOB: February 21, 1961 , 63 y.o.   MRN: [id number]  Chief Complaint  Patient presents with   Hypertension    Patient presents today for a bp and dm follow up, Patient reports compliance with medication. Patient denies any chest pain, SOB, or headaches. Patient has no concerns today.    Diabetes    HPI  Presents for referral for eye injury and diabetes check.  States that blood vessel has burst in right eye.  He feels as if a shade if over his vision field in the right eye.  Increased drainage in mornings, experiencing photophobia and double vision when looking at screens.  Would like referral to Dr Elner Ophthalmologist.  Has seen Dr Claudene with [facility] in January for regular exam.      Eye Injury  The right eye is affected. This is a new problem. The current episode started 1 to 4 weeks ago. The problem occurs constantly. The problem has been gradually worsening. The injury mechanism is unknown. The pain is at a severity of 0/10. The patient is experiencing no pain. There is No known exposure to pink eye. He Does not wear contacts. Associated symptoms include blurred vision, double vision (with looking at screens), a foreign body sensation, itching and photophobia. Pertinent negatives include no fever. He has tried nothing for the symptoms. The treatment provided no relief.  Diabetes He presents for his follow-up diabetic visit. He has type 2 diabetes mellitus. Pertinent negatives for hypoglycemia include no dizziness or headaches. Associated symptoms include blurred vision. Pertinent negatives for diabetes include no chest pain, no fatigue, no polydipsia, no polyphagia and no polyuria. Pertinent negatives for hypoglycemia  complications include no blackouts. Diabetic complications include heart disease, peripheral neuropathy and retinopathy. Risk factors for coronary artery disease include diabetes mellitus, hypertension, male sex, obesity and family history. Current diabetic treatment includes diet. He is compliant with treatment some of the time. He is following a generally unhealthy diet. When asked about meal planning, he reported none. He has not had a previous visit with a dietitian. He never participates in exercise. His home blood glucose trend is fluctuating minimally. An ACE inhibitor/angiotensin II receptor blocker is not being taken. He does not see a podiatrist.Eye exam is current.     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 Claudene follows   Diabetes mellitus    pt. states he's borderline diabetic., no longer taking med,- off med. since 2013   Dialysis patient [facility])    Mon-Wed-Fri([facility])- Left AV fistula   Diverticulitis 03/2015   reoccurred in December 2016   Diverticulitis of intestine without perforation or abscess without bleeding 04/21/2015   Dizziness 11/22/2018   DVT (deep venous  thrombosis) (HCC)    ESRD (end stage renal disease) (HCC)    GERD (gastroesophageal reflux disease)    Gout    Hemorrhage of left kidney 01/13/2018   History of nephrectomy 07/04/2012   History of right nephrectomy in 2002 for renal cell carcinoma    History of unilateral nephrectomy    Hypertension    Hypotension due  to hypovolemia 06/04/2020   Low iron     Myocardial infarction [facility]) ?2006   Renal cell carcinoma    dialysis- MWF- [address]- Dr. Froylan follows.   Renal insufficiency    Shortness of breath 05/19/2011   at rest, lying down, w/exertion   Stroke [facility]) 02/2011   05/19/11 denies residual   Umbilical hernia 05/19/2011   unrepaired   Wears glasses      Family History  Problem Relation Age of Onset   Hypertension Mother    Kidney disease Mother      Current Outpatient Medications:    acetaminophen  (TYLENOL ) 500 MG tablet, Take 500 mg by mouth as needed., Disp: , Rfl:    albuterol  (VENTOLIN  HFA) 108 (90 Base) MCG/ACT inhaler, Inhale 2 puffs into the lungs every 6 (six) hours as needed for wheezing or shortness of breath., Disp: 18 g, Rfl: 2   atorvastatin  (LIPITOR) 40 MG tablet, Take 1 tablet (40 mg total) by mouth daily., Disp: 90 tablet, Rfl: 3   calcium  acetate (PHOSLO ) 667 MG capsule, Take 1-3 capsules (667-2,001 mg total) by mouth See admin instructions. Take 2001 mg capsules with meals three times daily and 667 mg capsule with snacks., Disp:  , Rfl:    digoxin  (LANOXIN ) 0.125 MG tablet, Take 1 tablet (0.125 mg total) by mouth every Monday, Wednesday, and Friday., Disp: 30 tablet, Rfl: 1   gabapentin  (NEURONTIN ) 300 MG capsule, Take 1 capsule (300 mg total) by mouth at bedtime., Disp: 90 capsule, Rfl: 1   glucose blood test strip, Use to check blood sugar 3 times a day. Dx code e11.65, Disp: 100 each, Rfl: 12   Lancets (ONETOUCH DELICA PLUS LANCET33G) MISC, Use to check blood sugar 3 times a day. Dx code e11.65, Disp: 100 each, Rfl: 3   loperamide  (IMODIUM  A-D) 2 MG tablet, Take 1 tablet (2 mg total) by mouth 4 (four) times daily as needed for diarrhea or loose stools. Also available OTC., Disp: 30 tablet, Rfl: 0   midodrine  (PROAMATINE ) 10 MG tablet, Take 1 tablet (10 mg total) by mouth 3 (three) times daily., Disp: 90 tablet, Rfl: 3   pantoprazole  (PROTONIX ) 40 MG tablet,  Take 40 mg by mouth 2 (two) times daily., Disp: , Rfl:    UNABLE TO FIND, Out patient physical therapy- vestibular therapy for BPPV, Disp: 1 each, Rfl: 0   warfarin (COUMADIN ) 7.5 MG tablet, Take 10 mg by mouth daily., Disp: , Rfl:    Control Gel Formula Dressing (DUODERM CGF EXTRA THIN) MISC, Apply 1 each topically daily as needed. (Patient not taking: Reported on 11/10/2023), Disp: 10 each, Rfl: 2   Allergies  Allergen Reactions   Ace Inhibitors Itching, Cough and Other (See Comments)     Review of Systems  Constitutional:  Negative for chills, fatigue and fever.  HENT:  Negative for congestion.   Eyes:  Positive for blurred vision, double vision (with looking at screens), photophobia, itching and visual disturbance. Negative for pain.  Respiratory:  Negative for cough and shortness of breath.   Cardiovascular:  Negative for chest pain and palpitations.  Endocrine: Negative for  polydipsia, polyphagia and polyuria.  Neurological:  Negative for dizziness, light-headedness and headaches.     Today's Vitals   11/10/23 1149  BP: 136/80  Pulse: 97  Temp: 98.3 F (36.8 C)  TempSrc: Oral  Weight: 236 lb 12.8 oz (107.4 kg)  Height: 6' 1 (1.854 m)  PainSc: 10-Worst pain ever  PainLoc: Knee   Body mass index is 31.24 kg/m.  Wt Readings from Last 3 Encounters:  11/10/23 236 lb 12.8 oz (107.4 kg)  10/13/23 237 lb 12.8 oz (107.9 kg)  09/06/23 235 lb (106.6 kg)    The ASCVD Risk score (Arnett DK, et al., 2019) failed to calculate for the following reasons:   Risk score cannot be calculated because patient has a medical history suggesting prior/existing ASCVD  Objective:  Physical Exam Constitutional:      General: He is not in acute distress. HENT:     Head: Normocephalic and atraumatic.   Eyes:     General: Visual field deficit present.        Right eye: No discharge.        Left eye: No discharge (redness).     Extraocular Movements: Extraocular movements intact.      Pupils: Pupils are equal, round, and reactive to light.    Cardiovascular:     Rate and Rhythm: Normal rate and regular rhythm.     Pulses: Normal pulses.     Heart sounds: Normal heart sounds.  Pulmonary:     Effort: Pulmonary effort is normal.     Breath sounds: Normal breath sounds.   Skin:    General: Skin is warm and dry.     Capillary Refill: Capillary refill takes less than 2 seconds.   Neurological:     General: No focal deficit present.     Mental Status: He is alert and oriented to person, place, and time. Mental status is at baseline.     Gait: Gait abnormal (uses walker).   Psychiatric:        Mood and Affect: Mood normal.        Behavior: Behavior normal.        Thought Content: Thought content normal.         Assessment And Plan:  Type 2 diabetes mellitus with end-stage renal disease (HCC) Assessment & Plan: Controlled, No current medications.   Orders: -     Ambulatory referral to Ophthalmology  End-stage renal disease on hemodialysis [facility]) Assessment & Plan: Continue dialysis Monday Wednesday Friday.   Mixed hyperlipidemia Assessment & Plan: Cholesterol levels have been stable, continue statin. Tolerating well.    Hypertension, benign Assessment & Plan: Blood pressure is fairly controlled, continue current medications.    COVID-19 vaccination declined Assessment & Plan: Declines covid 19 vaccine. Discussed risk of covid 35 and if he changes her mind about the vaccine to call the office. Education has been provided regarding the importance of this vaccine but patient still declined. Advised may receive this vaccine at local pharmacy or Health Dept.or vaccine clinic. Aware to provide a copy of the vaccination record if obtained from local pharmacy or Health Dept.  Encouraged to take multivitamin, vitamin d , vitamin c and zinc to increase immune system. Aware can call office if would like to have vaccine here at office. Verbalized acceptance and  understanding.    Herpes zoster vaccination declined Assessment & Plan: Declines shingrix , educated on disease process and is aware if he changes his mind to notify office    Vision changes  Assessment & Plan: During hospitalization imaging showed chronic lacunar infarcts, will refer to Dr Elner at the patients request  Orders: -     Ambulatory referral to Ophthalmology    Return for controlled DM check 4 months.  Patient was given opportunity to ask questions. Patient verbalized understanding of the plan and was able to repeat key elements of the plan. All questions were answered to their satisfaction.    LILLETTE Matthew Ada, FNP, have reviewed all documentation for this visit. The documentation on 11/10/23 for the exam, diagnosis, procedures, and orders are all accurate and complete.   IF YOU HAVE BEEN REFERRED TO A SPECIALIST, IT MAY TAKE 1-2 WEEKS TO SCHEDULE/PROCESS THE REFERRAL. IF YOU HAVE NOT HEARD FROM US /SPECIALIST IN TWO WEEKS, PLEASE GIVE US  A CALL AT [phone] X [phone].

## 2023-11-11 DIAGNOSIS — Z7901 Long term (current) use of anticoagulants: Secondary | ICD-10-CM | POA: Diagnosis not present

## 2023-11-11 DIAGNOSIS — Z992 Dependence on renal dialysis: Secondary | ICD-10-CM | POA: Diagnosis not present

## 2023-11-11 DIAGNOSIS — N2581 Secondary hyperparathyroidism of renal origin: Secondary | ICD-10-CM | POA: Diagnosis not present

## 2023-11-11 DIAGNOSIS — N186 End stage renal disease: Secondary | ICD-10-CM | POA: Diagnosis not present

## 2023-11-11 DIAGNOSIS — I480 Paroxysmal atrial fibrillation: Secondary | ICD-10-CM | POA: Diagnosis not present

## 2023-11-14 DIAGNOSIS — Z992 Dependence on renal dialysis: Secondary | ICD-10-CM | POA: Diagnosis not present

## 2023-11-14 DIAGNOSIS — N2581 Secondary hyperparathyroidism of renal origin: Secondary | ICD-10-CM | POA: Diagnosis not present

## 2023-11-14 DIAGNOSIS — N186 End stage renal disease: Secondary | ICD-10-CM | POA: Diagnosis not present

## 2023-11-15 DIAGNOSIS — I5042 Chronic combined systolic (congestive) and diastolic (congestive) heart failure: Secondary | ICD-10-CM | POA: Diagnosis not present

## 2023-11-15 DIAGNOSIS — H538 Other visual disturbances: Secondary | ICD-10-CM | POA: Diagnosis not present

## 2023-11-15 DIAGNOSIS — E1140 Type 2 diabetes mellitus with diabetic neuropathy, unspecified: Secondary | ICD-10-CM | POA: Diagnosis not present

## 2023-11-15 DIAGNOSIS — M1030 Gout due to renal impairment, unspecified site: Secondary | ICD-10-CM | POA: Diagnosis not present

## 2023-11-15 DIAGNOSIS — N186 End stage renal disease: Secondary | ICD-10-CM | POA: Diagnosis not present

## 2023-11-15 DIAGNOSIS — I483 Typical atrial flutter: Secondary | ICD-10-CM | POA: Diagnosis not present

## 2023-11-15 DIAGNOSIS — E1122 Type 2 diabetes mellitus with diabetic chronic kidney disease: Secondary | ICD-10-CM | POA: Diagnosis not present

## 2023-11-15 DIAGNOSIS — I132 Hypertensive heart and chronic kidney disease with heart failure and with stage 5 chronic kidney disease, or end stage renal disease: Secondary | ICD-10-CM | POA: Diagnosis not present

## 2023-11-16 DIAGNOSIS — N186 End stage renal disease: Secondary | ICD-10-CM | POA: Diagnosis not present

## 2023-11-16 DIAGNOSIS — Z992 Dependence on renal dialysis: Secondary | ICD-10-CM | POA: Diagnosis not present

## 2023-11-16 DIAGNOSIS — N2581 Secondary hyperparathyroidism of renal origin: Secondary | ICD-10-CM | POA: Diagnosis not present

## 2023-11-17 DIAGNOSIS — E1122 Type 2 diabetes mellitus with diabetic chronic kidney disease: Secondary | ICD-10-CM | POA: Diagnosis not present

## 2023-11-17 DIAGNOSIS — I5042 Chronic combined systolic (congestive) and diastolic (congestive) heart failure: Secondary | ICD-10-CM | POA: Diagnosis not present

## 2023-11-17 DIAGNOSIS — N186 End stage renal disease: Secondary | ICD-10-CM | POA: Diagnosis not present

## 2023-11-17 DIAGNOSIS — M1030 Gout due to renal impairment, unspecified site: Secondary | ICD-10-CM | POA: Diagnosis not present

## 2023-11-17 DIAGNOSIS — H538 Other visual disturbances: Secondary | ICD-10-CM | POA: Diagnosis not present

## 2023-11-17 DIAGNOSIS — I132 Hypertensive heart and chronic kidney disease with heart failure and with stage 5 chronic kidney disease, or end stage renal disease: Secondary | ICD-10-CM | POA: Diagnosis not present

## 2023-11-17 DIAGNOSIS — E1140 Type 2 diabetes mellitus with diabetic neuropathy, unspecified: Secondary | ICD-10-CM | POA: Diagnosis not present

## 2023-11-17 DIAGNOSIS — I483 Typical atrial flutter: Secondary | ICD-10-CM | POA: Diagnosis not present

## 2023-11-18 DIAGNOSIS — N2581 Secondary hyperparathyroidism of renal origin: Secondary | ICD-10-CM | POA: Diagnosis not present

## 2023-11-18 DIAGNOSIS — Z992 Dependence on renal dialysis: Secondary | ICD-10-CM | POA: Diagnosis not present

## 2023-11-18 DIAGNOSIS — N186 End stage renal disease: Secondary | ICD-10-CM | POA: Diagnosis not present

## 2023-11-20 ENCOUNTER — Encounter: Payer: Self-pay | Admitting: Nurse Practitioner

## 2023-11-20 NOTE — Assessment & Plan Note (Signed)
 Declines shingrix, educated on disease process and is aware if he changes his mind to notify office

## 2023-11-20 NOTE — Assessment & Plan Note (Signed)
Blood pressure is fairly controlled, continue current medications.  

## 2023-11-20 NOTE — Assessment & Plan Note (Signed)
Cholesterol levels have been stable, continue statin. Tolerating well.

## 2023-11-20 NOTE — Assessment & Plan Note (Signed)
 Controlled, No current medications.

## 2023-11-20 NOTE — Assessment & Plan Note (Signed)
 Continue dialysis Monday Wednesday Friday

## 2023-11-20 NOTE — Assessment & Plan Note (Signed)
 Declines covid 19 vaccine. Discussed risk of covid 32 and if he changes her mind about the vaccine to call the office. Education has been provided regarding the importance of this vaccine but patient still declined. Advised may receive this vaccine at local pharmacy or Health Dept.or vaccine clinic. Aware to provide a copy of the vaccination record if obtained from local pharmacy or Health Dept.  Encouraged to take multivitamin, vitamin d, vitamin c and zinc to increase immune system. Aware can call office if would like to have vaccine here at office. Verbalized acceptance and understanding.

## 2023-11-21 ENCOUNTER — Telehealth: Payer: Self-pay

## 2023-11-21 DIAGNOSIS — I120 Hypertensive chronic kidney disease with stage 5 chronic kidney disease or end stage renal disease: Secondary | ICD-10-CM | POA: Diagnosis not present

## 2023-11-21 DIAGNOSIS — Z992 Dependence on renal dialysis: Secondary | ICD-10-CM | POA: Diagnosis not present

## 2023-11-21 DIAGNOSIS — N186 End stage renal disease: Secondary | ICD-10-CM | POA: Diagnosis not present

## 2023-11-21 DIAGNOSIS — N2581 Secondary hyperparathyroidism of renal origin: Secondary | ICD-10-CM | POA: Diagnosis not present

## 2023-11-21 NOTE — Telephone Encounter (Signed)
 Remigio tried to call patient regarding Referral and  MAILBOX IS FULL FOR [phone]

## 2023-11-22 DIAGNOSIS — E1140 Type 2 diabetes mellitus with diabetic neuropathy, unspecified: Secondary | ICD-10-CM | POA: Diagnosis not present

## 2023-11-22 DIAGNOSIS — I5042 Chronic combined systolic (congestive) and diastolic (congestive) heart failure: Secondary | ICD-10-CM | POA: Diagnosis not present

## 2023-11-22 DIAGNOSIS — I483 Typical atrial flutter: Secondary | ICD-10-CM | POA: Diagnosis not present

## 2023-11-22 DIAGNOSIS — N186 End stage renal disease: Secondary | ICD-10-CM | POA: Diagnosis not present

## 2023-11-22 DIAGNOSIS — H538 Other visual disturbances: Secondary | ICD-10-CM | POA: Diagnosis not present

## 2023-11-22 DIAGNOSIS — M1030 Gout due to renal impairment, unspecified site: Secondary | ICD-10-CM | POA: Diagnosis not present

## 2023-11-22 DIAGNOSIS — I132 Hypertensive heart and chronic kidney disease with heart failure and with stage 5 chronic kidney disease, or end stage renal disease: Secondary | ICD-10-CM | POA: Diagnosis not present

## 2023-11-22 DIAGNOSIS — E1122 Type 2 diabetes mellitus with diabetic chronic kidney disease: Secondary | ICD-10-CM | POA: Diagnosis not present

## 2023-11-23 DIAGNOSIS — Z992 Dependence on renal dialysis: Secondary | ICD-10-CM | POA: Diagnosis not present

## 2023-11-23 DIAGNOSIS — N186 End stage renal disease: Secondary | ICD-10-CM | POA: Diagnosis not present

## 2023-11-23 DIAGNOSIS — N2581 Secondary hyperparathyroidism of renal origin: Secondary | ICD-10-CM | POA: Diagnosis not present

## 2023-11-24 DIAGNOSIS — I5042 Chronic combined systolic (congestive) and diastolic (congestive) heart failure: Secondary | ICD-10-CM | POA: Diagnosis not present

## 2023-11-24 DIAGNOSIS — H538 Other visual disturbances: Secondary | ICD-10-CM | POA: Diagnosis not present

## 2023-11-24 DIAGNOSIS — I132 Hypertensive heart and chronic kidney disease with heart failure and with stage 5 chronic kidney disease, or end stage renal disease: Secondary | ICD-10-CM | POA: Diagnosis not present

## 2023-11-24 DIAGNOSIS — I483 Typical atrial flutter: Secondary | ICD-10-CM | POA: Diagnosis not present

## 2023-11-24 DIAGNOSIS — N186 End stage renal disease: Secondary | ICD-10-CM | POA: Diagnosis not present

## 2023-11-24 DIAGNOSIS — E1122 Type 2 diabetes mellitus with diabetic chronic kidney disease: Secondary | ICD-10-CM | POA: Diagnosis not present

## 2023-11-24 DIAGNOSIS — E1140 Type 2 diabetes mellitus with diabetic neuropathy, unspecified: Secondary | ICD-10-CM | POA: Diagnosis not present

## 2023-11-24 DIAGNOSIS — M1030 Gout due to renal impairment, unspecified site: Secondary | ICD-10-CM | POA: Diagnosis not present

## 2023-11-25 DIAGNOSIS — N186 End stage renal disease: Secondary | ICD-10-CM | POA: Diagnosis not present

## 2023-11-25 DIAGNOSIS — N2581 Secondary hyperparathyroidism of renal origin: Secondary | ICD-10-CM | POA: Diagnosis not present

## 2023-11-25 DIAGNOSIS — Z992 Dependence on renal dialysis: Secondary | ICD-10-CM | POA: Diagnosis not present

## 2023-11-28 DIAGNOSIS — N186 End stage renal disease: Secondary | ICD-10-CM | POA: Diagnosis not present

## 2023-11-28 DIAGNOSIS — N2581 Secondary hyperparathyroidism of renal origin: Secondary | ICD-10-CM | POA: Diagnosis not present

## 2023-11-28 DIAGNOSIS — Z992 Dependence on renal dialysis: Secondary | ICD-10-CM | POA: Diagnosis not present

## 2023-11-29 DIAGNOSIS — N186 End stage renal disease: Secondary | ICD-10-CM | POA: Diagnosis not present

## 2023-11-29 DIAGNOSIS — I132 Hypertensive heart and chronic kidney disease with heart failure and with stage 5 chronic kidney disease, or end stage renal disease: Secondary | ICD-10-CM | POA: Diagnosis not present

## 2023-11-29 DIAGNOSIS — I5042 Chronic combined systolic (congestive) and diastolic (congestive) heart failure: Secondary | ICD-10-CM | POA: Diagnosis not present

## 2023-11-29 DIAGNOSIS — M1030 Gout due to renal impairment, unspecified site: Secondary | ICD-10-CM | POA: Diagnosis not present

## 2023-11-29 DIAGNOSIS — E1140 Type 2 diabetes mellitus with diabetic neuropathy, unspecified: Secondary | ICD-10-CM | POA: Diagnosis not present

## 2023-11-29 DIAGNOSIS — E1122 Type 2 diabetes mellitus with diabetic chronic kidney disease: Secondary | ICD-10-CM | POA: Diagnosis not present

## 2023-11-29 DIAGNOSIS — I483 Typical atrial flutter: Secondary | ICD-10-CM | POA: Diagnosis not present

## 2023-11-29 DIAGNOSIS — H538 Other visual disturbances: Secondary | ICD-10-CM | POA: Diagnosis not present

## 2023-11-30 DIAGNOSIS — Z992 Dependence on renal dialysis: Secondary | ICD-10-CM | POA: Diagnosis not present

## 2023-11-30 DIAGNOSIS — N186 End stage renal disease: Secondary | ICD-10-CM | POA: Diagnosis not present

## 2023-11-30 DIAGNOSIS — N2581 Secondary hyperparathyroidism of renal origin: Secondary | ICD-10-CM | POA: Diagnosis not present

## 2023-12-01 DIAGNOSIS — I483 Typical atrial flutter: Secondary | ICD-10-CM | POA: Diagnosis not present

## 2023-12-01 DIAGNOSIS — N186 End stage renal disease: Secondary | ICD-10-CM | POA: Diagnosis not present

## 2023-12-01 DIAGNOSIS — I5042 Chronic combined systolic (congestive) and diastolic (congestive) heart failure: Secondary | ICD-10-CM | POA: Diagnosis not present

## 2023-12-01 DIAGNOSIS — I132 Hypertensive heart and chronic kidney disease with heart failure and with stage 5 chronic kidney disease, or end stage renal disease: Secondary | ICD-10-CM | POA: Diagnosis not present

## 2023-12-01 DIAGNOSIS — M1030 Gout due to renal impairment, unspecified site: Secondary | ICD-10-CM | POA: Diagnosis not present

## 2023-12-01 DIAGNOSIS — E1122 Type 2 diabetes mellitus with diabetic chronic kidney disease: Secondary | ICD-10-CM | POA: Diagnosis not present

## 2023-12-01 DIAGNOSIS — H538 Other visual disturbances: Secondary | ICD-10-CM | POA: Diagnosis not present

## 2023-12-01 DIAGNOSIS — E1140 Type 2 diabetes mellitus with diabetic neuropathy, unspecified: Secondary | ICD-10-CM | POA: Diagnosis not present

## 2023-12-02 DIAGNOSIS — Z992 Dependence on renal dialysis: Secondary | ICD-10-CM | POA: Diagnosis not present

## 2023-12-02 DIAGNOSIS — N186 End stage renal disease: Secondary | ICD-10-CM | POA: Diagnosis not present

## 2023-12-02 DIAGNOSIS — N2581 Secondary hyperparathyroidism of renal origin: Secondary | ICD-10-CM | POA: Diagnosis not present

## 2023-12-05 DIAGNOSIS — N186 End stage renal disease: Secondary | ICD-10-CM | POA: Diagnosis not present

## 2023-12-05 DIAGNOSIS — Z992 Dependence on renal dialysis: Secondary | ICD-10-CM | POA: Diagnosis not present

## 2023-12-05 DIAGNOSIS — N2581 Secondary hyperparathyroidism of renal origin: Secondary | ICD-10-CM | POA: Diagnosis not present

## 2023-12-06 DIAGNOSIS — H1711 Central corneal opacity, right eye: Secondary | ICD-10-CM | POA: Diagnosis not present

## 2023-12-06 DIAGNOSIS — H3509 Other intraretinal microvascular abnormalities: Secondary | ICD-10-CM | POA: Diagnosis not present

## 2023-12-06 DIAGNOSIS — H2513 Age-related nuclear cataract, bilateral: Secondary | ICD-10-CM | POA: Diagnosis not present

## 2023-12-06 DIAGNOSIS — H35033 Hypertensive retinopathy, bilateral: Secondary | ICD-10-CM | POA: Diagnosis not present

## 2023-12-06 DIAGNOSIS — E119 Type 2 diabetes mellitus without complications: Secondary | ICD-10-CM | POA: Diagnosis not present

## 2023-12-06 LAB — HM DIABETES EYE EXAM

## 2023-12-07 DIAGNOSIS — Z992 Dependence on renal dialysis: Secondary | ICD-10-CM | POA: Diagnosis not present

## 2023-12-07 DIAGNOSIS — N186 End stage renal disease: Secondary | ICD-10-CM | POA: Diagnosis not present

## 2023-12-07 DIAGNOSIS — N2581 Secondary hyperparathyroidism of renal origin: Secondary | ICD-10-CM | POA: Diagnosis not present

## 2023-12-08 DIAGNOSIS — E1122 Type 2 diabetes mellitus with diabetic chronic kidney disease: Secondary | ICD-10-CM | POA: Diagnosis not present

## 2023-12-08 DIAGNOSIS — I5042 Chronic combined systolic (congestive) and diastolic (congestive) heart failure: Secondary | ICD-10-CM | POA: Diagnosis not present

## 2023-12-08 DIAGNOSIS — H538 Other visual disturbances: Secondary | ICD-10-CM | POA: Diagnosis not present

## 2023-12-08 DIAGNOSIS — E1140 Type 2 diabetes mellitus with diabetic neuropathy, unspecified: Secondary | ICD-10-CM | POA: Diagnosis not present

## 2023-12-08 DIAGNOSIS — I132 Hypertensive heart and chronic kidney disease with heart failure and with stage 5 chronic kidney disease, or end stage renal disease: Secondary | ICD-10-CM | POA: Diagnosis not present

## 2023-12-08 DIAGNOSIS — M1030 Gout due to renal impairment, unspecified site: Secondary | ICD-10-CM | POA: Diagnosis not present

## 2023-12-08 DIAGNOSIS — N186 End stage renal disease: Secondary | ICD-10-CM | POA: Diagnosis not present

## 2023-12-08 DIAGNOSIS — I483 Typical atrial flutter: Secondary | ICD-10-CM | POA: Diagnosis not present

## 2023-12-09 DIAGNOSIS — I480 Paroxysmal atrial fibrillation: Secondary | ICD-10-CM | POA: Diagnosis not present

## 2023-12-09 DIAGNOSIS — N186 End stage renal disease: Secondary | ICD-10-CM | POA: Diagnosis not present

## 2023-12-09 DIAGNOSIS — Z7901 Long term (current) use of anticoagulants: Secondary | ICD-10-CM | POA: Diagnosis not present

## 2023-12-09 DIAGNOSIS — N2581 Secondary hyperparathyroidism of renal origin: Secondary | ICD-10-CM | POA: Diagnosis not present

## 2023-12-09 DIAGNOSIS — Z992 Dependence on renal dialysis: Secondary | ICD-10-CM | POA: Diagnosis not present

## 2023-12-12 DIAGNOSIS — N186 End stage renal disease: Secondary | ICD-10-CM | POA: Diagnosis not present

## 2023-12-12 DIAGNOSIS — N2581 Secondary hyperparathyroidism of renal origin: Secondary | ICD-10-CM | POA: Diagnosis not present

## 2023-12-12 DIAGNOSIS — Z992 Dependence on renal dialysis: Secondary | ICD-10-CM | POA: Diagnosis not present

## 2023-12-13 DIAGNOSIS — E1140 Type 2 diabetes mellitus with diabetic neuropathy, unspecified: Secondary | ICD-10-CM | POA: Diagnosis not present

## 2023-12-13 DIAGNOSIS — E1122 Type 2 diabetes mellitus with diabetic chronic kidney disease: Secondary | ICD-10-CM | POA: Diagnosis not present

## 2023-12-13 DIAGNOSIS — N186 End stage renal disease: Secondary | ICD-10-CM | POA: Diagnosis not present

## 2023-12-13 DIAGNOSIS — I5042 Chronic combined systolic (congestive) and diastolic (congestive) heart failure: Secondary | ICD-10-CM | POA: Diagnosis not present

## 2023-12-13 DIAGNOSIS — I483 Typical atrial flutter: Secondary | ICD-10-CM | POA: Diagnosis not present

## 2023-12-13 DIAGNOSIS — M1030 Gout due to renal impairment, unspecified site: Secondary | ICD-10-CM | POA: Diagnosis not present

## 2023-12-13 DIAGNOSIS — I132 Hypertensive heart and chronic kidney disease with heart failure and with stage 5 chronic kidney disease, or end stage renal disease: Secondary | ICD-10-CM | POA: Diagnosis not present

## 2023-12-13 DIAGNOSIS — H538 Other visual disturbances: Secondary | ICD-10-CM | POA: Diagnosis not present

## 2023-12-14 DIAGNOSIS — N2581 Secondary hyperparathyroidism of renal origin: Secondary | ICD-10-CM | POA: Diagnosis not present

## 2023-12-14 DIAGNOSIS — Z992 Dependence on renal dialysis: Secondary | ICD-10-CM | POA: Diagnosis not present

## 2023-12-14 DIAGNOSIS — N186 End stage renal disease: Secondary | ICD-10-CM | POA: Diagnosis not present

## 2023-12-15 ENCOUNTER — Other Ambulatory Visit: Payer: Self-pay | Admitting: Nurse Practitioner

## 2023-12-15 DIAGNOSIS — N186 End stage renal disease: Secondary | ICD-10-CM

## 2023-12-15 DIAGNOSIS — H538 Other visual disturbances: Secondary | ICD-10-CM

## 2023-12-15 DIAGNOSIS — E1122 Type 2 diabetes mellitus with diabetic chronic kidney disease: Secondary | ICD-10-CM

## 2023-12-15 DIAGNOSIS — E669 Obesity, unspecified: Secondary | ICD-10-CM

## 2023-12-15 DIAGNOSIS — M1030 Gout due to renal impairment, unspecified site: Secondary | ICD-10-CM

## 2023-12-15 DIAGNOSIS — I132 Hypertensive heart and chronic kidney disease with heart failure and with stage 5 chronic kidney disease, or end stage renal disease: Secondary | ICD-10-CM

## 2023-12-15 DIAGNOSIS — I5042 Chronic combined systolic (congestive) and diastolic (congestive) heart failure: Secondary | ICD-10-CM

## 2023-12-15 NOTE — Progress Notes (Unsigned)
 Chief Complaint  Patient presents with   initial home care certification   Received home health orders orders from [facility]. Start of care 09/13/2023.   Certification and orders from 11/12/2023 through 01/10/2024 are reviewed, signed and faxed back to home health company.  Need of intermittent skilled services at home: PT and OT  The home health care plan has been established by me and will be reviewed and updated as needed to maximize patient recovery.  I certify that all home health services have been and will be furnished to the patient while under my care.  Face-to-face encounter in which the need for home health services was established: 09/06/2023  Patient is receiving home health services for the following diagnoses: Problem List Items Addressed This Visit       Endocrine   Type 2 diabetes mellitus with end-stage renal disease (HCC)     Other   Obesity (BMI 30-39.9)   Blurred vision, left eye - Primary   Other Visit Diagnoses       Malignant hypertension with heart failure and end-stage renal dis (HCC) [I13.2, N18.6]         Chronic combined systolic and diastolic heart failure (HCC) [I50.42]         End stage renal disease (HCC) [N18.6]         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

## 2023-12-16 DIAGNOSIS — Z992 Dependence on renal dialysis: Secondary | ICD-10-CM | POA: Diagnosis not present

## 2023-12-16 DIAGNOSIS — N186 End stage renal disease: Secondary | ICD-10-CM | POA: Diagnosis not present

## 2023-12-16 DIAGNOSIS — N2581 Secondary hyperparathyroidism of renal origin: Secondary | ICD-10-CM | POA: Diagnosis not present

## 2023-12-19 DIAGNOSIS — Z992 Dependence on renal dialysis: Secondary | ICD-10-CM | POA: Diagnosis not present

## 2023-12-19 DIAGNOSIS — N186 End stage renal disease: Secondary | ICD-10-CM | POA: Diagnosis not present

## 2023-12-19 DIAGNOSIS — N2581 Secondary hyperparathyroidism of renal origin: Secondary | ICD-10-CM | POA: Diagnosis not present

## 2023-12-20 DIAGNOSIS — I5042 Chronic combined systolic (congestive) and diastolic (congestive) heart failure: Secondary | ICD-10-CM | POA: Diagnosis not present

## 2023-12-20 DIAGNOSIS — I483 Typical atrial flutter: Secondary | ICD-10-CM | POA: Diagnosis not present

## 2023-12-20 DIAGNOSIS — I132 Hypertensive heart and chronic kidney disease with heart failure and with stage 5 chronic kidney disease, or end stage renal disease: Secondary | ICD-10-CM | POA: Diagnosis not present

## 2023-12-20 DIAGNOSIS — E1140 Type 2 diabetes mellitus with diabetic neuropathy, unspecified: Secondary | ICD-10-CM | POA: Diagnosis not present

## 2023-12-20 DIAGNOSIS — N186 End stage renal disease: Secondary | ICD-10-CM | POA: Diagnosis not present

## 2023-12-20 DIAGNOSIS — M1030 Gout due to renal impairment, unspecified site: Secondary | ICD-10-CM | POA: Diagnosis not present

## 2023-12-20 DIAGNOSIS — E1122 Type 2 diabetes mellitus with diabetic chronic kidney disease: Secondary | ICD-10-CM | POA: Diagnosis not present

## 2023-12-20 DIAGNOSIS — H538 Other visual disturbances: Secondary | ICD-10-CM | POA: Diagnosis not present

## 2023-12-21 DIAGNOSIS — N186 End stage renal disease: Secondary | ICD-10-CM | POA: Diagnosis not present

## 2023-12-21 DIAGNOSIS — N2581 Secondary hyperparathyroidism of renal origin: Secondary | ICD-10-CM | POA: Diagnosis not present

## 2023-12-21 DIAGNOSIS — Z992 Dependence on renal dialysis: Secondary | ICD-10-CM | POA: Diagnosis not present

## 2023-12-22 DIAGNOSIS — Z96611 Presence of right artificial shoulder joint: Secondary | ICD-10-CM | POA: Diagnosis not present

## 2023-12-22 DIAGNOSIS — N186 End stage renal disease: Secondary | ICD-10-CM | POA: Diagnosis not present

## 2023-12-22 DIAGNOSIS — I120 Hypertensive chronic kidney disease with stage 5 chronic kidney disease or end stage renal disease: Secondary | ICD-10-CM | POA: Diagnosis not present

## 2023-12-22 DIAGNOSIS — Z96612 Presence of left artificial shoulder joint: Secondary | ICD-10-CM | POA: Diagnosis not present

## 2023-12-22 DIAGNOSIS — Z992 Dependence on renal dialysis: Secondary | ICD-10-CM | POA: Diagnosis not present

## 2023-12-23 DIAGNOSIS — Z992 Dependence on renal dialysis: Secondary | ICD-10-CM | POA: Diagnosis not present

## 2023-12-23 DIAGNOSIS — N186 End stage renal disease: Secondary | ICD-10-CM | POA: Diagnosis not present

## 2023-12-23 DIAGNOSIS — N2581 Secondary hyperparathyroidism of renal origin: Secondary | ICD-10-CM | POA: Diagnosis not present

## 2023-12-26 DIAGNOSIS — N186 End stage renal disease: Secondary | ICD-10-CM | POA: Diagnosis not present

## 2023-12-26 DIAGNOSIS — N2581 Secondary hyperparathyroidism of renal origin: Secondary | ICD-10-CM | POA: Diagnosis not present

## 2023-12-26 DIAGNOSIS — Z992 Dependence on renal dialysis: Secondary | ICD-10-CM | POA: Diagnosis not present

## 2023-12-27 DIAGNOSIS — I132 Hypertensive heart and chronic kidney disease with heart failure and with stage 5 chronic kidney disease, or end stage renal disease: Secondary | ICD-10-CM | POA: Diagnosis not present

## 2023-12-27 DIAGNOSIS — M1030 Gout due to renal impairment, unspecified site: Secondary | ICD-10-CM | POA: Diagnosis not present

## 2023-12-27 DIAGNOSIS — I483 Typical atrial flutter: Secondary | ICD-10-CM | POA: Diagnosis not present

## 2023-12-27 DIAGNOSIS — I5042 Chronic combined systolic (congestive) and diastolic (congestive) heart failure: Secondary | ICD-10-CM | POA: Diagnosis not present

## 2023-12-27 DIAGNOSIS — E1122 Type 2 diabetes mellitus with diabetic chronic kidney disease: Secondary | ICD-10-CM | POA: Diagnosis not present

## 2023-12-27 DIAGNOSIS — E1140 Type 2 diabetes mellitus with diabetic neuropathy, unspecified: Secondary | ICD-10-CM | POA: Diagnosis not present

## 2023-12-27 DIAGNOSIS — H538 Other visual disturbances: Secondary | ICD-10-CM | POA: Diagnosis not present

## 2023-12-27 DIAGNOSIS — N186 End stage renal disease: Secondary | ICD-10-CM | POA: Diagnosis not present

## 2023-12-28 DIAGNOSIS — N186 End stage renal disease: Secondary | ICD-10-CM | POA: Diagnosis not present

## 2023-12-28 DIAGNOSIS — Z992 Dependence on renal dialysis: Secondary | ICD-10-CM | POA: Diagnosis not present

## 2023-12-28 DIAGNOSIS — N2581 Secondary hyperparathyroidism of renal origin: Secondary | ICD-10-CM | POA: Diagnosis not present

## 2023-12-30 DIAGNOSIS — N186 End stage renal disease: Secondary | ICD-10-CM | POA: Diagnosis not present

## 2023-12-30 DIAGNOSIS — N2581 Secondary hyperparathyroidism of renal origin: Secondary | ICD-10-CM | POA: Diagnosis not present

## 2023-12-30 DIAGNOSIS — Z992 Dependence on renal dialysis: Secondary | ICD-10-CM | POA: Diagnosis not present

## 2024-01-02 DIAGNOSIS — Z992 Dependence on renal dialysis: Secondary | ICD-10-CM | POA: Diagnosis not present

## 2024-01-02 DIAGNOSIS — N2581 Secondary hyperparathyroidism of renal origin: Secondary | ICD-10-CM | POA: Diagnosis not present

## 2024-01-02 DIAGNOSIS — N186 End stage renal disease: Secondary | ICD-10-CM | POA: Diagnosis not present

## 2024-01-04 DIAGNOSIS — N2581 Secondary hyperparathyroidism of renal origin: Secondary | ICD-10-CM | POA: Diagnosis not present

## 2024-01-04 DIAGNOSIS — N186 End stage renal disease: Secondary | ICD-10-CM | POA: Diagnosis not present

## 2024-01-04 DIAGNOSIS — Z992 Dependence on renal dialysis: Secondary | ICD-10-CM | POA: Diagnosis not present

## 2024-01-06 DIAGNOSIS — N2581 Secondary hyperparathyroidism of renal origin: Secondary | ICD-10-CM | POA: Diagnosis not present

## 2024-01-06 DIAGNOSIS — Z992 Dependence on renal dialysis: Secondary | ICD-10-CM | POA: Diagnosis not present

## 2024-01-06 DIAGNOSIS — N186 End stage renal disease: Secondary | ICD-10-CM | POA: Diagnosis not present

## 2024-01-09 DIAGNOSIS — Z992 Dependence on renal dialysis: Secondary | ICD-10-CM | POA: Diagnosis not present

## 2024-01-09 DIAGNOSIS — N186 End stage renal disease: Secondary | ICD-10-CM | POA: Diagnosis not present

## 2024-01-09 DIAGNOSIS — N2581 Secondary hyperparathyroidism of renal origin: Secondary | ICD-10-CM | POA: Diagnosis not present

## 2024-01-10 ENCOUNTER — Encounter: Payer: Self-pay | Admitting: Podiatry

## 2024-01-10 ENCOUNTER — Ambulatory Visit (INDEPENDENT_AMBULATORY_CARE_PROVIDER_SITE_OTHER): Admitting: Podiatry

## 2024-01-10 DIAGNOSIS — M79674 Pain in right toe(s): Secondary | ICD-10-CM

## 2024-01-10 DIAGNOSIS — E119 Type 2 diabetes mellitus without complications: Secondary | ICD-10-CM

## 2024-01-10 DIAGNOSIS — B351 Tinea unguium: Secondary | ICD-10-CM

## 2024-01-10 DIAGNOSIS — M79675 Pain in left toe(s): Secondary | ICD-10-CM

## 2024-01-10 DIAGNOSIS — Z7901 Long term (current) use of anticoagulants: Secondary | ICD-10-CM

## 2024-01-10 NOTE — Progress Notes (Signed)
 This patient returns to my office for at risk foot care.  This patient requires this care by a professional since this patient will be at risk due to having DM, chronic anticoagulation and ESRD.  This patient is unable to cut nails himself since the patient cannot reach his nails.These nails are painful walking and wearing shoes.  This patient presents for at risk foot care today.  General Appearance  Alert, conversant and in no acute stress.  Vascular  Dorsalis pedis and posterior tibial  pulses are palpable  bilaterally.  Capillary return is within normal limits  bilaterally. Temperature is within normal limits  bilaterally.  Neurologic  Senn-Weinstein monofilament wire test within normal limits  bilaterally. Muscle power within normal limits bilaterally.  Nails Thick disfigured discolored nails with subungual debris  from hallux to fifth toes bilaterally. No evidence of bacterial infection or drainage bilaterally.  Orthopedic  No limitations of motion  feet .  No crepitus or effusions noted.  No bony pathology or digital deformities noted.  Skin  normotropic skin with no porokeratosis noted bilaterally.  No signs of infections or ulcers noted.     Onychomycosis  Pain in right toes  Pain in left toes  Consent was obtained for treatment procedures.   Mechanical debridement of nails 1-5  bilaterally performed with a nail nipper.  Filed with dremel without incident.    Return office visit     3   months                 Told patient to return for periodic foot care and evaluation due to potential at risk complications.   Helane Gunther DPM

## 2024-01-11 DIAGNOSIS — Z992 Dependence on renal dialysis: Secondary | ICD-10-CM | POA: Diagnosis not present

## 2024-01-11 DIAGNOSIS — N186 End stage renal disease: Secondary | ICD-10-CM | POA: Diagnosis not present

## 2024-01-11 DIAGNOSIS — N2581 Secondary hyperparathyroidism of renal origin: Secondary | ICD-10-CM | POA: Diagnosis not present

## 2024-01-13 DIAGNOSIS — Z992 Dependence on renal dialysis: Secondary | ICD-10-CM | POA: Diagnosis not present

## 2024-01-13 DIAGNOSIS — N2581 Secondary hyperparathyroidism of renal origin: Secondary | ICD-10-CM | POA: Diagnosis not present

## 2024-01-13 DIAGNOSIS — N186 End stage renal disease: Secondary | ICD-10-CM | POA: Diagnosis not present

## 2024-01-16 DIAGNOSIS — N2581 Secondary hyperparathyroidism of renal origin: Secondary | ICD-10-CM | POA: Diagnosis not present

## 2024-01-16 DIAGNOSIS — N186 End stage renal disease: Secondary | ICD-10-CM | POA: Diagnosis not present

## 2024-01-16 DIAGNOSIS — Z992 Dependence on renal dialysis: Secondary | ICD-10-CM | POA: Diagnosis not present

## 2024-01-17 DIAGNOSIS — I480 Paroxysmal atrial fibrillation: Secondary | ICD-10-CM | POA: Diagnosis not present

## 2024-01-17 DIAGNOSIS — Z7901 Long term (current) use of anticoagulants: Secondary | ICD-10-CM | POA: Diagnosis not present

## 2024-01-18 DIAGNOSIS — Z992 Dependence on renal dialysis: Secondary | ICD-10-CM | POA: Diagnosis not present

## 2024-01-18 DIAGNOSIS — N186 End stage renal disease: Secondary | ICD-10-CM | POA: Diagnosis not present

## 2024-01-18 DIAGNOSIS — N2581 Secondary hyperparathyroidism of renal origin: Secondary | ICD-10-CM | POA: Diagnosis not present

## 2024-01-20 DIAGNOSIS — Z992 Dependence on renal dialysis: Secondary | ICD-10-CM | POA: Diagnosis not present

## 2024-01-20 DIAGNOSIS — N186 End stage renal disease: Secondary | ICD-10-CM | POA: Diagnosis not present

## 2024-01-20 DIAGNOSIS — N2581 Secondary hyperparathyroidism of renal origin: Secondary | ICD-10-CM | POA: Diagnosis not present

## 2024-01-22 DIAGNOSIS — N186 End stage renal disease: Secondary | ICD-10-CM | POA: Diagnosis not present

## 2024-01-22 DIAGNOSIS — I120 Hypertensive chronic kidney disease with stage 5 chronic kidney disease or end stage renal disease: Secondary | ICD-10-CM | POA: Diagnosis not present

## 2024-01-22 DIAGNOSIS — Z992 Dependence on renal dialysis: Secondary | ICD-10-CM | POA: Diagnosis not present

## 2024-01-23 DIAGNOSIS — N2581 Secondary hyperparathyroidism of renal origin: Secondary | ICD-10-CM | POA: Diagnosis not present

## 2024-01-23 DIAGNOSIS — Z992 Dependence on renal dialysis: Secondary | ICD-10-CM | POA: Diagnosis not present

## 2024-01-23 DIAGNOSIS — N186 End stage renal disease: Secondary | ICD-10-CM | POA: Diagnosis not present

## 2024-01-25 ENCOUNTER — Ambulatory Visit: Payer: Medicare HMO

## 2024-01-25 VITALS — BP 120/60 | HR 89 | Temp 97.6°F | Ht 73.0 in | Wt 229.4 lb

## 2024-01-25 DIAGNOSIS — Z0000 Encounter for general adult medical examination without abnormal findings: Secondary | ICD-10-CM | POA: Diagnosis not present

## 2024-01-25 DIAGNOSIS — N2581 Secondary hyperparathyroidism of renal origin: Secondary | ICD-10-CM | POA: Diagnosis not present

## 2024-01-25 DIAGNOSIS — Z992 Dependence on renal dialysis: Secondary | ICD-10-CM | POA: Diagnosis not present

## 2024-01-25 DIAGNOSIS — N186 End stage renal disease: Secondary | ICD-10-CM | POA: Diagnosis not present

## 2024-01-25 NOTE — Progress Notes (Signed)
 Subjective:   [name] is a 63 y.o. who presents for a Medicare Wellness preventive visit.  As a reminder, Annual Wellness Visits don't include a physical exam, and some assessments may be limited, especially if this visit is performed virtually. We may recommend an in-person follow-up visit with your provider if needed.  Visit Complete: In person    Persons Participating in Visit: Patient.  AWV Questionnaire: No: Patient Medicare AWV questionnaire was not completed prior to this visit.  Cardiac Risk Factors include: advanced age (>21men, >97 women);diabetes mellitus;hypertension;male gender;obesity (BMI >30kg/m2)     Objective:    Today's Vitals   01/25/24 1100 01/25/24 1101  BP: 120/60   Pulse: 89   Temp: 97.6 F (36.4 C)   TempSrc: Oral   SpO2: 99%   Weight: 229 lb 6.4 oz (104.1 kg)   Height: 6' 1 (1.854 m)   PainSc:  8    Body mass index is 30.27 kg/m.     01/25/2024   11:12 AM 10/11/2023    8:57 PM 10/11/2023   11:25 AM 07/12/2023   10:47 AM 03/18/2023   12:50 PM 01/19/2023   11:15 AM 09/20/2022    4:00 PM  Advanced Directives  Does Patient Have a Medical Advance Directive? No No No No No No No  Would patient like information on creating a medical advance directive? No - Patient declined No - Patient declined No - Patient declined  No - Patient declined  No - Patient declined    Current Medications (verified) Outpatient Encounter Medications as of 01/25/2024  Medication Sig   acetaminophen  (TYLENOL ) 500 MG tablet Take 500 mg by mouth as needed.   albuterol  (VENTOLIN  HFA) 108 (90 Base) MCG/ACT inhaler Inhale 2 puffs into the lungs every 6 (six) hours as needed for wheezing or shortness of breath.   atorvastatin  (LIPITOR) 40 MG tablet Take 1 tablet (40 mg total) by mouth daily.   calcium  acetate (PHOSLO ) 667 MG capsule Take 1-3 capsules (667-2,001 mg total) by mouth See admin instructions. Take 2001 mg capsules with meals three times daily and 667 mg capsule  with snacks.   digoxin  (LANOXIN ) 0.125 MG tablet Take 1 tablet (0.125 mg total) by mouth every Monday, Wednesday, and Friday.   gabapentin  (NEURONTIN ) 300 MG capsule Take 1 capsule (300 mg total) by mouth at bedtime.   glucose blood test strip Use to check blood sugar 3 times a day. Dx code e11.65   Lancets (ONETOUCH DELICA PLUS LANCET33G) MISC Use to check blood sugar 3 times a day. Dx code e11.65   loperamide  (IMODIUM  A-D) 2 MG tablet Take 1 tablet (2 mg total) by mouth 4 (four) times daily as needed for diarrhea or loose stools. Also available OTC.   midodrine  (PROAMATINE ) 10 MG tablet Take 1 tablet (10 mg total) by mouth 3 (three) times daily.   pantoprazole  (PROTONIX ) 40 MG tablet Take 40 mg by mouth 2 (two) times daily.   warfarin (COUMADIN ) 7.5 MG tablet Take 10 mg by mouth daily.   Control Gel Formula Dressing (DUODERM CGF EXTRA THIN) MISC Apply 1 each topically daily as needed. (Patient not taking: Reported on 01/25/2024)   UNABLE TO FIND Out patient physical therapy- vestibular therapy for BPPV   No facility-administered encounter medications on file as of 01/25/2024.    Allergies (verified) Ace inhibitors   History: Past Medical History:  Diagnosis Date   Acute lower GI bleeding 06/21/2018   Acute respiratory failure with hypoxia (HCC) 01/13/2015  Anaphylactic shock, unspecified, subsequent encounter 01/31/2020   Arthritis    Atrial flutter (HCC)    Bile leak, postoperative 03/15/2016   Biliary dyskinesia 02/12/2016   Blind right eye    Hx: of partial blindness in right eye   Cardiomyopathy    CHF (congestive heart failure) (HCC)    Coronary artery disease    normal coronaries by 10/10/08 cath, Cardiac Cath 08-04-12 epic.Dr. Claudene follows   Diabetes mellitus    pt. states he's borderline diabetic., no longer taking med,- off med. since 2013   Dialysis patient [facility])    Mon-Wed-Fri([facility])- Left AV fistula   Diverticulitis 03/2015   reoccurred in December  2016   Diverticulitis of intestine without perforation or abscess without bleeding 04/21/2015   Dizziness 11/22/2018   DVT (deep venous thrombosis) (HCC)    ESRD (end stage renal disease) (HCC)    GERD (gastroesophageal reflux disease)    Gout    Hemorrhage of left kidney 01/13/2018   History of nephrectomy 07/04/2012   History of right nephrectomy in 2002 for renal cell carcinoma    History of unilateral nephrectomy    Hypertension    Hypotension due to hypovolemia 06/04/2020   Low iron     Myocardial infarction [facility]) ?2006   Renal cell carcinoma    dialysis- MWF- [address]- Dr. Froylan follows.   Renal insufficiency    Shortness of breath 05/19/2011   at rest, lying down, w/exertion   Stroke [facility]) 02/2011   05/19/11 denies residual   Umbilical hernia 05/19/2011   unrepaired   Wears glasses    Past Surgical History:  Procedure Laterality Date   A/V FISTULAGRAM N/A 06/30/2023   Procedure: A/V Fistulagram;  Surgeon: Melia Lynwood ORN, MD;  Location: [facility] INVASIVE CV LAB;  Service: Cardiovascular;  Laterality: N/A;   ANAL FISTULOTOMY  08/15/2018   AV FISTULA PLACEMENT  03/29/2011   Procedure: ARTERIOVENOUS (AV) FISTULA CREATION;  Surgeon: Redell Lurena Door, MD;  Location: [facility] OR;  Service: Vascular;  Laterality: Left;  LEFT RADIOCEPHALIC , Arteriovenous [phone])   CARDIAC CATHETERIZATION  05/21/11   CARDIAC CATHETERIZATION N/A 03/16/2016   Procedure: Left Heart Cath and Coronary Angiography;  Surgeon: Salena Claudene, MD;  Location: [facility] INVASIVE CV LAB;  Service: Cardiovascular;  Laterality: N/A;   CHOLECYSTECTOMY N/A 02/12/2016   Procedure: LAPAROSCOPIC CHOLECYSTECTOMY WITH INTRAOPERATIVE CHOLANGIOGRAM;  Surgeon: Krystal Russell, MD;  Location: [facility] OR;  Service: General;  Laterality: N/A;   COLONOSCOPY WITH PROPOFOL  N/A 02/01/2017   Procedure: COLONOSCOPY WITH PROPOFOL ;  Surgeon: Kristie Lamprey, MD;  Location: [facility] ENDOSCOPY;  Service: Endoscopy;  Laterality: N/A;   dialysis cath  placed     ESOPHAGOGASTRODUODENOSCOPY (EGD) WITH PROPOFOL  N/A 12/02/2015   Procedure: ESOPHAGOGASTRODUODENOSCOPY (EGD) WITH PROPOFOL ;  Surgeon: Lamprey Kristie, MD;  Location: [facility] ENDOSCOPY;  Service: Endoscopy;  Laterality: N/A;   EVALUATION UNDER ANESTHESIA WITH HEMORRHOIDECTOMY N/A 08/15/2018   Procedure: HEMORRHOID LIGATION/PEXY;  Surgeon: Sheldon Standing, MD;  Location: [facility] OR;  Service: General;  Laterality: N/A;   FINGER SURGERY     L pinkie finger- ORIF- /w remaining hardware - 1990's     FISTULOTOMY N/A 08/15/2018   Procedure: SUPERFICIAL ANAL FISTULOTOMY;  Surgeon: Sheldon Standing, MD;  Location: [facility] OR;  Service: General;  Laterality: N/A;   HEMORRHOID SURGERY     HERNIA REPAIR N/A 07/04/2012   Umbilical hernia repair   INSERTION OF DIALYSIS CATHETER N/A 01/27/2016   Procedure: INSERTION OF DIALYSIS CATHETER;  Surgeon: Penne Lonni Colorado, MD;  Location: [facility]  OR;  Service: Vascular;  Laterality: N/A;   INSERTION OF MESH N/A 07/04/2012   Procedure: INSERTION OF MESH;  Surgeon: Redell Faith, DO;  Location: [facility] OR;  Service: General;  Laterality: N/A;   IR EMBO ART  VEN HEMORR LYMPH EXTRAV  INC GUIDE ROADMAPPING  01/13/2018   IR FLUORO GUIDE CV LINE RIGHT  01/13/2018   IR IVC FILTER PLMT / S&I /IMG GUID/MOD SED  01/27/2018   IR RENAL SELECTIVE  UNI INC S&I MOD SED  01/13/2018   IR US  GUIDE VASC ACCESS RIGHT  01/13/2018   KIDNEY CYST REMOVAL  2019   LAPAROSCOPIC LYSIS OF ADHESIONS N/A 02/12/2016   Procedure: LAPAROSCOPIC LYSIS OF ADHESIONS;  Surgeon: Krystal Russell, MD;  Location: [facility] OR;  Service: General;  Laterality: N/A;   LEFT AND RIGHT HEART CATHETERIZATION WITH CORONARY ANGIOGRAM N/A 08/04/2012   Procedure: LEFT AND RIGHT HEART CATHETERIZATION WITH CORONARY ANGIOGRAM;  Surgeon: Salena GORMAN Negri, MD;  Location: [facility] CATH LAB;  Service: Cardiovascular;  Laterality: N/A;   NEPHRECTOMY  2000   right   PERIPHERAL VASCULAR BALLOON ANGIOPLASTY Left 06/30/2023   Procedure: PERIPHERAL VASCULAR BALLOON  ANGIOPLASTY;  Surgeon: Melia Lynwood ORN, MD;  Location: [facility] INVASIVE CV LAB;  Service: Cardiovascular;  Laterality: Left;  60% cephalic vein   REVERSE SHOULDER ARTHROPLASTY Right 09/20/2022   Procedure: REVERSE SHOULDER ARTHROPLASTY;  Surgeon: Sharl Selinda Dover, MD;  Location: [facility] OR;  Service: Orthopedics;  Laterality: Right;  120   REVERSE SHOULDER ARTHROPLASTY Left 03/18/2023   Procedure: REVERSE SHOULDER ARTHROPLASTY;  Surgeon: Sharl Selinda Dover, MD;  Location: [facility] OR;  Service: Orthopedics;  Laterality: Left;  120   REVISON OF ARTERIOVENOUS FISTULA Left 06/05/2013   Procedure: REVISON OF LEFT RADIOCEPHALIC  ARTERIOVENOUS FISTULA;  Surgeon: Redell LITTIE Door, MD;  Location: [facility] OR;  Service: Vascular;  Laterality: Left;   REVISON OF ARTERIOVENOUS FISTULA Left 01/27/2016   Procedure: REVISION OF LEFT UPPER EXTREMITY ARTERIOVENOUS FISTULA;  Surgeon: Penne Lonni Colorado, MD;  Location: [facility] OR;  Service: Vascular;  Laterality: Left;   REVISON OF ARTERIOVENOUS FISTULA Left 08/03/2016   Procedure: REVISON OF Left arm ARTERIOVENOUS FISTULA;  Surgeon: Carlin FORBES Haddock, MD;  Location: [facility] OR;  Service: Vascular;  Laterality: Left;   REVISON OF ARTERIOVENOUS FISTULA Left 05/06/2017   Procedure: REVISION PLICATION OF ARTERIOVENOUS FISTULA LEFT ARM;  Surgeon: Oris Krystal FALCON, MD;  [facility] OR;  Service: Vascular;  Laterality: Left;   REVISON OF ARTERIOVENOUS FISTULA Left 02/01/2019   Procedure: REVISION OF ARTERIOVENOUS FISTULA LEFT ARM;  Surgeon: Gretta Lonni PARAS, MD;  Location: [facility] OR;  Service: Vascular;  Laterality: Left;   REVISON OF ARTERIOVENOUS FISTULA Left 03/15/2019   Procedure: REVISION PLICATION OF ARTERIOVENOUS FISTULA LEFT ARM;  Surgeon: Gretta Lonni PARAS, MD;  Location: [facility] OR;  Service: Vascular;  Laterality: Left;   RIGHT HEART CATHETERIZATION N/A 05/21/2011   Procedure: RIGHT HEART CATH;  Surgeon: Salena GORMAN Negri, MD;  Location: [facility] CATH LAB;  Service: Cardiovascular;  Laterality: N/A;    smashed  1990's   left pinky; have a plate in there   UMBILICAL HERNIA REPAIR N/A 07/04/2012   Procedure: LAPAROSCOPIC UMBILICAL HERNIA;  Surgeon: Redell Faith, DO;  Location: [facility] OR;  Service: General;  Laterality: N/A;   US  ECHOCARDIOGRAPHY  05/20/11   Family History  Problem Relation Age of Onset   Hypertension Mother    Kidney disease Mother    Social History   Socioeconomic History   Marital status: Married  Spouse name: Not on file   Number of children: Not on file   Years of education: Not on file   Highest education level: Not on file  Occupational History   Occupation: disability  Tobacco Use   Smoking status: Never   Smokeless tobacco: Never  Vaping Use   Vaping status: Never Used  Substance and Sexual Activity   Alcohol use: No    Alcohol/week: 0.0 standard drinks of alcohol   Drug use: Not Currently    Comment: Past hx-none in 20 yrs   Sexual activity: Yes  Other Topics Concern   Not on file  Social History Narrative   Not on file   Social Drivers of Health   Financial Resource Strain: Low Risk  (01/25/2024)   Overall Financial Resource Strain (CARDIA)    Difficulty of Paying Living Expenses: Not hard at all  Food Insecurity: No Food Insecurity (01/25/2024)   Hunger Vital Sign    Worried About Running Out of Food in the Last Year: Never true    Ran Out of Food in the Last Year: Never true  Transportation Needs: No Transportation Needs (01/25/2024)   PRAPARE - Administrator, Civil Service (Medical): No    Lack of Transportation (Non-Medical): No  Physical Activity: Insufficiently Active (01/25/2024)   Exercise Vital Sign    Days of Exercise per Week: 2 days    Minutes of Exercise per Session: 30 min  Stress: No Stress Concern Present (01/25/2024)   [employer] of Occupational Health - Occupational Stress Questionnaire    Feeling of Stress: Not at all  Social Connections: Moderately Isolated (01/25/2024)   Social Connection and Isolation  Panel    Frequency of Communication with Friends and Family: More than three times a week    Frequency of Social Gatherings with Friends and Family: More than three times a week    Attends Religious Services: Never    Database administrator or Organizations: No    Attends Engineer, structural: Never    Marital Status: Married    Tobacco Counseling Counseling given: Not Answered    Clinical Intake:  Pre-visit preparation completed: Yes  Pain : 0-10 Pain Score: 8  Pain Type: Chronic pain Pain Location:  (nipples) Pain Orientation: Right, Left Pain Descriptors / Indicators: Burning Pain Onset: More than a month ago Pain Frequency: Constant     Nutritional Risks: Nausea/ vomitting/ diarrhea (nausea daily) Diabetes: Yes CBG done?: No Did pt. bring in CBG monitor from home?: No  Lab Results  Component Value Date   HGBA1C 4.8 10/12/2023   HGBA1C 5.0 07/05/2023   HGBA1C 5.6 03/03/2023     How often do you need to have someone help you when you read instructions, pamphlets, or other written materials from your doctor or pharmacy?: 1 - Never  Interpreter Needed?: No  Information entered by :: NAllen LPN   Activities of Daily Living     01/25/2024   11:05 AM 10/11/2023    8:57 PM  In your present state of health, do you have any difficulty performing the following activities:  Hearing? 0 0  Vision? 1 1  Comment blind in right eye   Difficulty concentrating or making decisions? 0 0  Walking or climbing stairs? 1   Dressing or bathing? 1   Doing errands, shopping? 1 0  Preparing Food and eating ? Y   Using the Toilet? N   In the past six months, have you accidently leaked  urine? N   Do you have problems with loss of bowel control? N   Managing your Medications? Y   Managing your Finances? N   Housekeeping or managing your Housekeeping? Y     Patient Care Team: Georgina Speaks, FNP as PCP - General (General Practice) Sheldon Standing, MD as Consulting  Physician (General Surgery) Kristie Lamprey, MD as Consulting Physician (Gastroenterology) Claudene Pacific, MD as Consulting Physician (Cardiology) Rayburn Pac, MD as Consulting Physician (Nephrology) Arnell Rockney PARAS, [facility] (Inactive) (Pharmacist) Rayburn Pac, MD as Consulting Physician (Nephrology) Leslee Reusing, MD as Consulting Physician (Ophthalmology)  I have updated your Care Teams any recent Medical Services you may have received from other providers in the past year.     Assessment:   This is a routine wellness examination for [name].  Hearing/Vision screen Hearing Screening - Comments:: Denies hearing issues Vision Screening - Comments::  Seeing a specialist this month   Goals Addressed             This Visit's Progress    Patient Stated       01/25/2024, get strength in arms and legs       Depression Screen     01/25/2024   11:13 AM 01/19/2023   11:16 AM 07/01/2022   10:09 AM 01/07/2022   11:23 AM 12/25/2020    9:30 AM 11/27/2020    8:41 AM 11/15/2019    9:18 AM  PHQ 2/9 Scores  PHQ - 2 Score 0 0 0 0 0 0 0  PHQ- 9 Score 5 3         Fall Risk     01/25/2024   11:12 AM 01/19/2023   11:16 AM 07/01/2022   10:09 AM 03/16/2022    9:35 AM 01/07/2022   11:22 AM  Fall Risk   Falls in the past year? 0 0 0 0 1  Comment     legs gave out  Number falls in past yr: 0 0  0 1  Injury with Fall? 0 0  0 0  Risk for fall due to : No Fall Risks;Impaired vision;Medication side effect;Impaired mobility;Impaired balance/gait Medication side effect;Impaired vision;Impaired mobility;Impaired balance/gait  No Fall Risks Impaired balance/gait;Medication side effect  Follow up Falls evaluation completed;Falls prevention discussed Falls prevention discussed;Falls evaluation completed  Falls evaluation completed  Falls evaluation completed;Education provided;Falls prevention discussed      Data saved with a previous flowsheet row definition    MEDICARE RISK AT HOME:  Medicare  Risk at Home Any stairs in or around the home?: Yes (ramp) If so, are there any without handrails?: No Home free of loose throw rugs in walkways, pet beds, electrical cords, etc?: Yes Adequate lighting in your home to reduce risk of falls?: Yes Life alert?: No Use of a cane, walker or w/c?: Yes Grab bars in the bathroom?: Yes Shower chair or bench in shower?: Yes Elevated toilet seat or a handicapped toilet?: Yes  TIMED UP AND GO:  Was the test performed?  No  Cognitive Function: 6CIT completed        01/25/2024   11:14 AM 01/19/2023   11:17 AM 01/07/2022   11:27 AM 12/25/2020    9:31 AM 11/15/2019    9:19 AM  6CIT Screen  What Year? 0 points 0 points 0 points 0 points 0 points  What month? 0 points 0 points 0 points 0 points 0 points  What time? 0 points 0 points 0 points 0 points 0 points  Count back from  20 0 points 0 points 0 points 2 points 0 points  Months in reverse 4 points 4 points 0 points 4 points 4 points  Repeat phrase 0 points 4 points 10 points 4 points 4 points  Total Score 4 points 8 points 10 points 10 points 8 points    Immunizations Immunization History  Administered Date(s) Administered   PFIZER(Purple Top)SARS-COV-2 Vaccination 08/03/2019, 08/29/2019, 02/22/2020   Pfizer Covid-19 Vaccine Bivalent Booster 48yrs & up 04/03/2021   Zoster Recombinant(Shingrix ) 11/10/2021    Screening Tests Health Maintenance  Topic Date Due   FOOT EXAM  11/11/2022   COVID-19 Vaccine (5 - 2025-26 season) 02/10/2024 (Originally 01/23/2024)   Zoster Vaccines- Shingrix  (2 of 2) 02/10/2024 (Originally 01/05/2022)   DTaP/Tdap/Td (1 - Tdap) 03/02/2024 (Originally 03/27/1982)   INFLUENZA VACCINE  08/21/2024 (Originally 12/23/2023)   Pneumococcal Vaccine: 50+ Years (1 of 2 - PCV) 01/24/2025 (Originally 03/27/1982)   HEMOGLOBIN A1C  04/13/2024   OPHTHALMOLOGY EXAM  12/05/2024   Medicare Annual Wellness (AWV)  01/24/2025   Colonoscopy  02/02/2027   Hepatitis B Vaccines 19-59 Average  Risk  Completed   Hepatitis C Screening  Completed   HIV Screening  Completed   HPV VACCINES  Aged Out   Meningococcal B Vaccine  Aged Out    Health Maintenance  Health Maintenance Due  Topic Date Due   FOOT EXAM  11/11/2022   Health Maintenance Items Addressed: Declines vaccines  Additional Screening:  Vision Screening: Recommended annual ophthalmology exams for early detection of glaucoma and other disorders of the eye. Would you like a referral to an eye doctor? No    Dental Screening: Recommended annual dental exams for proper oral hygiene  Community Resource Referral / Chronic Care Management: CRR required this visit?  No   CCM required this visit?  No   Plan:    I have personally reviewed and noted the following in the patient's chart:   Medical and social history Use of alcohol, tobacco or illicit drugs  Current medications and supplements including opioid prescriptions. Patient is not currently taking opioid prescriptions. Functional ability and status Nutritional status Physical activity Advanced directives List of other physicians Hospitalizations, surgeries, and ER visits in previous 12 months Vitals Screenings to include cognitive, depression, and falls Referrals and appointments  In addition, I have reviewed and discussed with patient certain preventive protocols, quality metrics, and best practice recommendations. A written personalized care plan for preventive services as well as general preventive health recommendations were provided to patient.   Ardella FORBES Dawn, LPN   [id number]   After Visit Summary: (In Person-Printed) AVS printed and given to the patient  Notes: Nothing significant to report at this time.

## 2024-01-25 NOTE — Patient Instructions (Signed)
 [name] , Thank you for taking time out of your busy schedule to complete your Annual Wellness Visit with me. I enjoyed our conversation and look forward to speaking with you again next year. I, as well as your care team,  appreciate your ongoing commitment to your health goals. Please review the following plan we discussed and let me know if I can assist you in the future. Your Game plan/ To Do List    Referrals: If you haven't heard from the office you've been referred to, please reach out to them at the phone provided.   Follow up Visits: We will see or speak with you next year for your Next Medicare AWV with our clinical staff Have you seen your provider in the last 6 months (3 months if uncontrolled diabetes)? Yes  Clinician Recommendations:  Aim for 30 minutes of exercise or brisk walking, 6-8 glasses of water, and 5 servings of fruits and vegetables each day.       This is a list of the screenings recommended for you:  Health Maintenance  Topic Date Due   Complete foot exam   11/11/2022   COVID-19 Vaccine (5 - 2025-26 season) 02/10/2024*   Zoster (Shingles) Vaccine (2 of 2) 02/10/2024*   DTaP/Tdap/Td vaccine (1 - Tdap) 03/02/2024*   Flu Shot  08/21/2024*   Pneumococcal Vaccine for age over 62 (1 of 2 - PCV) 01/24/2025*   Hemoglobin A1C  04/13/2024   Eye exam for diabetics  12/05/2024   Medicare Annual Wellness Visit  01/24/2025   Colon Cancer Screening  02/02/2027   Hepatitis B Vaccine  Completed   Hepatitis C Screening  Completed   HIV Screening  Completed   HPV Vaccine  Aged Out   Meningitis B Vaccine  Aged Out  *Topic was postponed. The date shown is not the original due date.    Advanced directives: (Declined) Advance directive discussed with you today. Even though you declined this today, please call our office should you change your mind, and we can give you the proper paperwork for you to fill out. Advance Care Planning is important because it:  [x]  Makes sure you  receive the medical care that is consistent with your values, goals, and preferences  [x]  It provides guidance to your family and loved ones and reduces their decisional burden about whether or not they are making the right decisions based on your wishes.  Follow the link provided in your after visit summary or read over the paperwork we have mailed to you to help you started getting your Advance Directives in place. If you need assistance in completing these, please reach out to us  so that we can help you!  See attachments for Preventive Care and Fall Prevention Tips.

## 2024-01-25 NOTE — Progress Notes (Addendum)
 LILLETTE Kristeen JINNY Gladis, CMA,acting as a Neurosurgeon for Gaines Ada, FNP.,have documented all relevant documentation on the behalf of Gaines Ada, FNP,as directed by  Gaines Ada, FNP while in the presence of Gaines Ada, FNP.  Subjective:  Patient ID: [name] , male    DOB: 1961/03/07 , 63 y.o.   MRN: [id number]  Chief Complaint  Patient presents with   Penis Itch    Patient presents today for penis irritation, patient reports it first started 1 week ago. Patient reports it is very itchy.    Hypertension    Patient presents today for a bp and dm follow up, Patient reports compliance with medication. Patient denies any chest pain, SOB, or headaches.     HPI Discussed the use of AI scribe software for clinical note transcription with the patient, who gave verbal consent to proceed.  History of Present Illness [name] is a 63 year old male with diabetes who presents with facial and penile irritation. He is accompanied by his daughter.  He has been experiencing facial irritation for about a week to a week and a half, characterized by itching and small bumps. He has been using an ointment provided during dialysis for irritation, but he is unsure of its name.  He also reports irritation on the foreskin of his penis, described as having a little pain on the top and some redness. He is uncircumcised, and the irritation is located on the skin part. No discharge or burning sensation is present. He reports that he does not urinate and his penis does not get used often, and he wonders if the irritation might be related to moisture. He has not changed underwear recently but mentions that his wife thought it might be related to dish detergent or washing powder.  His blood sugar levels have been running over 100, with recent readings of 102, 104, 97, and 108. He checks his blood sugar regularly.  He experiences burning and hardening of his nipples during dialysis, which he finds painful and bothersome.  This occurs only during dialysis sessions and not on other days.   Diabetes He presents for his follow-up diabetic visit. He has type 2 diabetes mellitus. There are no hypoglycemic associated symptoms. There are no diabetic associated symptoms. There are no hypoglycemic complications. Diabetic complications include nephropathy. Risk factors for coronary artery disease include obesity, sedentary lifestyle, male sex and diabetes mellitus. When asked about current treatments, none were reported. When asked about meal planning, he reported none. He has not had a previous visit with a dietitian. (Blood sugar 98-104) He does not see a podiatrist.Eye exam is current.     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 Claudene follows   Diabetes mellitus    pt. states he's borderline diabetic., no longer taking med,- off med. since 2013   Dialysis patient    Mon-Wed-Fri([facility])- Left AV fistula   Diverticulitis 03/2015   reoccurred in December 2016   Diverticulitis of intestine  without perforation or abscess without bleeding 04/21/2015   Dizziness 11/22/2018   DVT (deep venous thrombosis) (HCC)    ESRD (end stage renal disease) (HCC)    GERD (gastroesophageal reflux disease)    Gout    Hemorrhage of left kidney 01/13/2018   History of nephrectomy 07/04/2012   History of right nephrectomy in 2002 for renal cell carcinoma    History of unilateral nephrectomy    Hypertension    Hypotension due to hypovolemia  06/04/2020   Low iron     Myocardial infarction [facility]) ?2006   Renal cell carcinoma    dialysis- MWF- [address]- Dr. Froylan follows.   Renal insufficiency    Shortness of breath 05/19/2011   at rest, lying down, w/exertion   Stroke [facility]) 02/2011   05/19/11 denies residual   Umbilical hernia 05/19/2011   unrepaired   Wears glasses      Family History  Problem Relation Age of Onset   Hypertension Mother    Kidney disease Mother      Current Outpatient Medications:    acetaminophen  (TYLENOL ) 500 MG tablet, Take 500 mg by mouth as needed., Disp: , Rfl:    albuterol  (VENTOLIN  HFA) 108 (90 Base) MCG/ACT inhaler, Inhale 2 puffs into the lungs every 6 (six) hours as needed for wheezing or shortness of breath., Disp: 18 g, Rfl: 2   atorvastatin  (LIPITOR) 40 MG tablet, Take 1 tablet (40 mg total) by mouth daily., Disp: 90 tablet, Rfl: 3   calcium  acetate (PHOSLO ) 667 MG capsule, Take 1-3 capsules (667-2,001 mg total) by mouth See admin instructions. Take 2001 mg capsules with meals three times daily and 667 mg capsule with snacks., Disp:  , Rfl:    clotrimazole  (CLOTRIMAZOLE  ANTI-FUNGAL) 1 % cream, Apply 1 Application topically 2 (two) times daily., Disp: 30 g, Rfl: 0   digoxin  (LANOXIN ) 0.125 MG tablet, Take 1 tablet (0.125 mg total) by mouth every Monday, Wednesday, and Friday., Disp: 30 tablet, Rfl: 1   glucose blood test strip, Use to check blood sugar 3 times a day. Dx code e11.65, Disp: 100 each, Rfl: 12   Lancets (ONETOUCH DELICA PLUS LANCET33G) MISC, Use to check blood sugar 3 times a day. Dx code e11.65, Disp: 100 each, Rfl: 3   loperamide  (IMODIUM  A-D) 2 MG tablet, Take 1 tablet (2 mg total) by mouth 4 (four) times daily as needed for diarrhea or loose stools. Also available OTC., Disp: 30 tablet, Rfl: 0   midodrine  (PROAMATINE ) 10 MG tablet, Take 1 tablet (10 mg total) by mouth 3 (three) times daily., Disp: 90 tablet, Rfl: 3   pantoprazole  (PROTONIX ) 40 MG tablet, Take 40 mg  by mouth 2 (two) times daily., Disp: , Rfl:    UNABLE TO FIND, Out patient physical therapy- vestibular therapy for BPPV, Disp: 1 each, Rfl: 0   warfarin (COUMADIN ) 7.5 MG tablet, Take 10 mg by mouth daily., Disp: , Rfl:    Control Gel Formula Dressing (DUODERM CGF EXTRA THIN) MISC, Apply 1 each topically daily as needed. (Patient not taking: Reported on 01/26/2024), Disp: 10 each, Rfl: 2   gabapentin  (NEURONTIN ) 300 MG capsule, Take 1 capsule (300 mg total) by mouth at bedtime., Disp: 90 capsule, Rfl: 1   Allergies  Allergen Reactions   Ace Inhibitors Itching, Cough and Other (See Comments)     Review of Systems  Constitutional: Negative.   Respiratory: Negative.    Cardiovascular: Negative.   Gastrointestinal: Negative.   Neurological: Negative.   Psychiatric/Behavioral: Negative.  Today's Vitals   01/26/24 0939  BP: 128/70  Pulse: 100  Temp: 98.5 F (36.9 C)  TempSrc: Oral  Weight: 236 lb (107 kg)  Height: 6' 1 (1.854 m)  PainSc: 0-No pain   Body mass index is 31.14 kg/m.  Wt Readings from Last 3 Encounters:  01/26/24 236 lb (107 kg)  01/25/24 229 lb 6.4 oz (104.1 kg)  11/10/23 236 lb 12.8 oz (107.4 kg)      Objective:  Physical Exam Vitals and nursing note reviewed. Exam conducted with a chaperone present.  Constitutional:      General: He is not in acute distress. HENT:     Head: Normocephalic and atraumatic.  Eyes:     General:        Right eye: No discharge.        Left eye: No discharge (redness).     Extraocular Movements: Extraocular movements intact.     Pupils: Pupils are equal, round, and reactive to light.  Cardiovascular:     Rate and Rhythm: Normal rate and regular rhythm.     Pulses: Normal pulses.     Heart sounds: Normal heart sounds.  Pulmonary:     Effort: Pulmonary effort is normal. No respiratory distress.     Breath sounds: Normal breath sounds. No wheezing.  Genitourinary:    Pubic Area: No rash.      Penis: Uncircumcised.  Lesions present. No erythema, tenderness, discharge or swelling.   Skin:    General: Skin is warm and dry.     Capillary Refill: Capillary refill takes less than 2 seconds.  Neurological:     General: No focal deficit present.     Mental Status: He is alert and oriented to person, place, and time. Mental status is at baseline.     Gait: Gait abnormal (in wheelchair).  Psychiatric:        Mood and Affect: Mood normal.        Behavior: Behavior normal.        Thought Content: Thought content normal.         Assessment And Plan:  Irritation of penis Assessment & Plan: Irritation and itching likely due to yeast infection from moisture. Differential includes irritation versus ulceration. No burning or discharge. Possible contribution from detergent or moisture retention. - Prescribe clotrimazole  cream for application to the affected area. - Advise keeping the area dry to prevent moisture accumulation. - Use Gold bond powder for moisture control. - Order blood test to rule out bacterial infection and possible syphyllis   Orders: -     RPR -     Clotrimazole ; Apply 1 Application topically 2 (two) times daily.  Dispense: 30 g; Refill: 0 -     RPR, quant & T.pallidum antibodies -     RPR; Future  Type 2 diabetes mellitus with end-stage renal disease (HCC) [E11.22, N18.6] Assessment & Plan: Controlled, No current medications. 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 Continue current medications   Mixed hyperlipidemia Assessment & Plan: Cholesterol levels have been stable, continue statin. Tolerating well.   Orders: -     Lipid panel  Atherosclerosis of native coronary artery of native heart without angina pectoris Assessment & Plan: Stable, continue statin   Obesity (BMI 30-39.9) [E66.9] Assessment &  Plan: He is encouraged to  strive for BMI less than 30 to decrease cardiac risk. Advised to aim for at least 150 minutes of exercise per week.    Other orders -     Gabapentin ; Take 1 capsule (300 mg total) by mouth at bedtime.  Dispense: 90 capsule; Refill: 1     Return for controlled DM check 4 months.  Patient was given opportunity to ask questions. Patient verbalized understanding of the plan and was able to repeat key elements of the plan. All questions were answered to their satisfaction.    LILLETTE Gaines Ada, FNP, have reviewed all documentation for this visit. The documentation on 01/26/24 for the exam, diagnosis, procedures, and orders are all accurate and complete.   IF YOU HAVE BEEN REFERRED TO A SPECIALIST, IT MAY TAKE 1-2 WEEKS TO SCHEDULE/PROCESS THE REFERRAL. IF YOU HAVE NOT HEARD FROM US /SPECIALIST IN TWO WEEKS, PLEASE GIVE US  A CALL AT [phone].

## 2024-01-26 ENCOUNTER — Ambulatory Visit (INDEPENDENT_AMBULATORY_CARE_PROVIDER_SITE_OTHER): Admitting: Nurse Practitioner

## 2024-01-26 ENCOUNTER — Encounter: Payer: Self-pay | Admitting: Nurse Practitioner

## 2024-01-26 VITALS — BP 128/70 | HR 100 | Temp 98.5°F | Ht 73.0 in | Wt 236.0 lb

## 2024-01-26 DIAGNOSIS — E669 Obesity, unspecified: Secondary | ICD-10-CM | POA: Diagnosis not present

## 2024-01-26 DIAGNOSIS — I132 Hypertensive heart and chronic kidney disease with heart failure and with stage 5 chronic kidney disease, or end stage renal disease: Secondary | ICD-10-CM | POA: Diagnosis not present

## 2024-01-26 DIAGNOSIS — E1122 Type 2 diabetes mellitus with diabetic chronic kidney disease: Secondary | ICD-10-CM | POA: Diagnosis not present

## 2024-01-26 DIAGNOSIS — E782 Mixed hyperlipidemia: Secondary | ICD-10-CM | POA: Diagnosis not present

## 2024-01-26 DIAGNOSIS — N186 End stage renal disease: Secondary | ICD-10-CM

## 2024-01-26 DIAGNOSIS — I2510 Atherosclerotic heart disease of native coronary artery without angina pectoris: Secondary | ICD-10-CM | POA: Diagnosis not present

## 2024-01-26 DIAGNOSIS — N4889 Other specified disorders of penis: Secondary | ICD-10-CM

## 2024-01-26 MED ORDER — GABAPENTIN 300 MG PO CAPS: 300.0000 mg | ORAL_CAPSULE | Freq: Every day | ORAL | 1 refills | Status: DC

## 2024-01-26 MED ORDER — CLOTRIMAZOLE 1 % EX CREA: 1.0000 | TOPICAL_CREAM | Freq: Two times a day (BID) | CUTANEOUS | 0 refills | Status: DC

## 2024-01-27 DIAGNOSIS — Z992 Dependence on renal dialysis: Secondary | ICD-10-CM | POA: Diagnosis not present

## 2024-01-27 DIAGNOSIS — N186 End stage renal disease: Secondary | ICD-10-CM | POA: Diagnosis not present

## 2024-01-27 DIAGNOSIS — N2581 Secondary hyperparathyroidism of renal origin: Secondary | ICD-10-CM | POA: Diagnosis not present

## 2024-01-27 LAB — BMP8+EGFR
BUN/Creatinine Ratio: 4 — ABNORMAL LOW (ref 10–24)
BUN: 34 mg/dL — ABNORMAL HIGH (ref 8–27)
CO2: 21 mmol/L (ref 20–29)
Calcium: 9.8 mg/dL (ref 8.6–10.2)
Chloride: 92 mmol/L — ABNORMAL LOW (ref 96–106)
Creatinine, Ser: 8.98 mg/dL — ABNORMAL HIGH (ref 0.76–1.27)
Glucose: 81 mg/dL (ref 70–99)
Potassium: 5 mmol/L (ref 3.5–5.2)
Sodium: 136 mmol/L (ref 134–144)
eGFR: 6 mL/min/1.73 — ABNORMAL LOW (ref >59)

## 2024-01-27 LAB — LIPID PANEL
Chol/HDL Ratio: 2.8 ratio (ref 0.0–5.0)
Cholesterol, Total: 141 mg/dL (ref 100–199)
HDL: 50 mg/dL (ref >39)
LDL Chol Calc (NIH): 72 mg/dL (ref 0–99)
Triglycerides: 103 mg/dL (ref 0–149)
VLDL Cholesterol Cal: 19 mg/dL (ref 5–40)

## 2024-01-27 LAB — HEMOGLOBIN A1C
Est. average glucose Bld gHb Est-mCnc: 111 mg/dL
Hgb A1c MFr Bld: 5.5 % (ref 4.8–5.6)

## 2024-01-27 LAB — RPR: RPR Ser Ql: REACTIVE — AB

## 2024-01-27 LAB — RPR, QUANT+TP ABS (REFLEX)
Rapid Plasma Reagin, Quant: 1:2 {titer} — ABNORMAL HIGH
T Pallidum Abs: REACTIVE — AB

## 2024-01-30 DIAGNOSIS — N186 End stage renal disease: Secondary | ICD-10-CM | POA: Diagnosis not present

## 2024-01-30 DIAGNOSIS — Z992 Dependence on renal dialysis: Secondary | ICD-10-CM | POA: Diagnosis not present

## 2024-01-30 DIAGNOSIS — H1711 Central corneal opacity, right eye: Secondary | ICD-10-CM | POA: Diagnosis not present

## 2024-01-30 DIAGNOSIS — N2581 Secondary hyperparathyroidism of renal origin: Secondary | ICD-10-CM | POA: Diagnosis not present

## 2024-01-30 DIAGNOSIS — H2513 Age-related nuclear cataract, bilateral: Secondary | ICD-10-CM | POA: Diagnosis not present

## 2024-01-30 DIAGNOSIS — H3562 Retinal hemorrhage, left eye: Secondary | ICD-10-CM | POA: Diagnosis not present

## 2024-01-31 ENCOUNTER — Ambulatory Visit: Payer: Self-pay | Admitting: Nurse Practitioner

## 2024-02-01 DIAGNOSIS — N186 End stage renal disease: Secondary | ICD-10-CM | POA: Diagnosis not present

## 2024-02-01 DIAGNOSIS — Z992 Dependence on renal dialysis: Secondary | ICD-10-CM | POA: Diagnosis not present

## 2024-02-01 DIAGNOSIS — N2581 Secondary hyperparathyroidism of renal origin: Secondary | ICD-10-CM | POA: Diagnosis not present

## 2024-02-03 DIAGNOSIS — N2581 Secondary hyperparathyroidism of renal origin: Secondary | ICD-10-CM | POA: Diagnosis not present

## 2024-02-03 DIAGNOSIS — N186 End stage renal disease: Secondary | ICD-10-CM | POA: Diagnosis not present

## 2024-02-03 DIAGNOSIS — Z992 Dependence on renal dialysis: Secondary | ICD-10-CM | POA: Diagnosis not present

## 2024-02-06 DIAGNOSIS — N186 End stage renal disease: Secondary | ICD-10-CM | POA: Diagnosis not present

## 2024-02-06 DIAGNOSIS — N2581 Secondary hyperparathyroidism of renal origin: Secondary | ICD-10-CM | POA: Diagnosis not present

## 2024-02-06 DIAGNOSIS — Z992 Dependence on renal dialysis: Secondary | ICD-10-CM | POA: Diagnosis not present

## 2024-02-07 NOTE — Assessment & Plan Note (Signed)
 Controlled, No current medications. Continue dialysis MWF

## 2024-02-07 NOTE — Assessment & Plan Note (Signed)
 Stable, continue statin

## 2024-02-07 NOTE — Assessment & Plan Note (Signed)
 Irritation and itching likely due to yeast infection from moisture. Differential includes irritation versus ulceration. No burning or discharge. Possible contribution from detergent or moisture retention. - Prescribe clotrimazole  cream for application to the affected area. - Advise keeping the area dry to prevent moisture accumulation. - Use Gold bond powder for moisture control. - Order blood test to rule out bacterial infection and possible syphyllis

## 2024-02-07 NOTE — Assessment & Plan Note (Signed)
 Blood pressure is well controlled. Continue current medications.

## 2024-02-07 NOTE — Assessment & Plan Note (Signed)
 He is encouraged to strive for BMI less than 30 to decrease cardiac risk. Advised to aim for at least 150 minutes of exercise per week.

## 2024-02-07 NOTE — Assessment & Plan Note (Signed)
Cholesterol levels have been stable, continue statin. Tolerating well.

## 2024-02-08 DIAGNOSIS — N186 End stage renal disease: Secondary | ICD-10-CM | POA: Diagnosis not present

## 2024-02-08 DIAGNOSIS — N2581 Secondary hyperparathyroidism of renal origin: Secondary | ICD-10-CM | POA: Diagnosis not present

## 2024-02-08 DIAGNOSIS — H34232 Retinal artery branch occlusion, left eye: Secondary | ICD-10-CM | POA: Diagnosis not present

## 2024-02-08 DIAGNOSIS — H3582 Retinal ischemia: Secondary | ICD-10-CM | POA: Diagnosis not present

## 2024-02-08 DIAGNOSIS — H2513 Age-related nuclear cataract, bilateral: Secondary | ICD-10-CM | POA: Diagnosis not present

## 2024-02-08 DIAGNOSIS — H348322 Tributary (branch) retinal vein occlusion, left eye, stable: Secondary | ICD-10-CM | POA: Diagnosis not present

## 2024-02-08 DIAGNOSIS — Z992 Dependence on renal dialysis: Secondary | ICD-10-CM | POA: Diagnosis not present

## 2024-02-08 DIAGNOSIS — H35033 Hypertensive retinopathy, bilateral: Secondary | ICD-10-CM | POA: Diagnosis not present

## 2024-02-10 DIAGNOSIS — N186 End stage renal disease: Secondary | ICD-10-CM | POA: Diagnosis not present

## 2024-02-10 DIAGNOSIS — N2581 Secondary hyperparathyroidism of renal origin: Secondary | ICD-10-CM | POA: Diagnosis not present

## 2024-02-10 DIAGNOSIS — Z992 Dependence on renal dialysis: Secondary | ICD-10-CM | POA: Diagnosis not present

## 2024-02-13 DIAGNOSIS — Z992 Dependence on renal dialysis: Secondary | ICD-10-CM | POA: Diagnosis not present

## 2024-02-13 DIAGNOSIS — N2581 Secondary hyperparathyroidism of renal origin: Secondary | ICD-10-CM | POA: Diagnosis not present

## 2024-02-13 DIAGNOSIS — N186 End stage renal disease: Secondary | ICD-10-CM | POA: Diagnosis not present

## 2024-02-15 DIAGNOSIS — N186 End stage renal disease: Secondary | ICD-10-CM | POA: Diagnosis not present

## 2024-02-15 DIAGNOSIS — Z992 Dependence on renal dialysis: Secondary | ICD-10-CM | POA: Diagnosis not present

## 2024-02-15 DIAGNOSIS — N2581 Secondary hyperparathyroidism of renal origin: Secondary | ICD-10-CM | POA: Diagnosis not present

## 2024-02-16 ENCOUNTER — Other Ambulatory Visit

## 2024-02-16 ENCOUNTER — Encounter (HOSPITAL_COMMUNITY): Payer: Self-pay | Admitting: *Deleted

## 2024-02-16 DIAGNOSIS — N4889 Other specified disorders of penis: Secondary | ICD-10-CM | POA: Diagnosis not present

## 2024-02-16 DIAGNOSIS — I480 Paroxysmal atrial fibrillation: Secondary | ICD-10-CM | POA: Diagnosis not present

## 2024-02-16 DIAGNOSIS — Z7901 Long term (current) use of anticoagulants: Secondary | ICD-10-CM | POA: Diagnosis not present

## 2024-02-17 DIAGNOSIS — Z992 Dependence on renal dialysis: Secondary | ICD-10-CM | POA: Diagnosis not present

## 2024-02-17 DIAGNOSIS — N186 End stage renal disease: Secondary | ICD-10-CM | POA: Diagnosis not present

## 2024-02-17 DIAGNOSIS — N2581 Secondary hyperparathyroidism of renal origin: Secondary | ICD-10-CM | POA: Diagnosis not present

## 2024-02-17 LAB — RPR, QUANT+TP ABS (REFLEX)
Rapid Plasma Reagin, Quant: 1:1 {titer} — ABNORMAL HIGH
T Pallidum Abs: REACTIVE — AB

## 2024-02-17 LAB — RPR: RPR Ser Ql: REACTIVE — AB

## 2024-02-20 DIAGNOSIS — N186 End stage renal disease: Secondary | ICD-10-CM | POA: Diagnosis not present

## 2024-02-20 DIAGNOSIS — Z992 Dependence on renal dialysis: Secondary | ICD-10-CM | POA: Diagnosis not present

## 2024-02-20 DIAGNOSIS — N2581 Secondary hyperparathyroidism of renal origin: Secondary | ICD-10-CM | POA: Diagnosis not present

## 2024-02-21 DIAGNOSIS — N186 End stage renal disease: Secondary | ICD-10-CM | POA: Diagnosis not present

## 2024-02-21 DIAGNOSIS — I120 Hypertensive chronic kidney disease with stage 5 chronic kidney disease or end stage renal disease: Secondary | ICD-10-CM | POA: Diagnosis not present

## 2024-02-21 DIAGNOSIS — Z992 Dependence on renal dialysis: Secondary | ICD-10-CM | POA: Diagnosis not present

## 2024-02-22 DIAGNOSIS — N186 End stage renal disease: Secondary | ICD-10-CM | POA: Diagnosis not present

## 2024-02-22 DIAGNOSIS — Z992 Dependence on renal dialysis: Secondary | ICD-10-CM | POA: Diagnosis not present

## 2024-02-22 DIAGNOSIS — N2581 Secondary hyperparathyroidism of renal origin: Secondary | ICD-10-CM | POA: Diagnosis not present

## 2024-02-24 DIAGNOSIS — N2581 Secondary hyperparathyroidism of renal origin: Secondary | ICD-10-CM | POA: Diagnosis not present

## 2024-02-24 DIAGNOSIS — Z992 Dependence on renal dialysis: Secondary | ICD-10-CM | POA: Diagnosis not present

## 2024-02-27 ENCOUNTER — Telehealth: Payer: Self-pay

## 2024-02-27 DIAGNOSIS — Z992 Dependence on renal dialysis: Secondary | ICD-10-CM | POA: Diagnosis not present

## 2024-02-27 DIAGNOSIS — N186 End stage renal disease: Secondary | ICD-10-CM | POA: Diagnosis not present

## 2024-02-27 DIAGNOSIS — N2581 Secondary hyperparathyroidism of renal origin: Secondary | ICD-10-CM | POA: Diagnosis not present

## 2024-02-27 NOTE — Telephone Encounter (Signed)
 Copied from CRM [id number]. Topic: Clinical - Lab/Test Results >> Feb 27, 2024  2:54 PM Darshell M wrote:  Reason for CRM: Patient needs medical records for last visit and test results faxed to [facility] Phone# [phone] and fax number is [phone].  Patient CB# [phone]   Labs faxed. CM, CMA .

## 2024-02-28 ENCOUNTER — Ambulatory Visit: Admitting: Nurse Practitioner

## 2024-03-02 DIAGNOSIS — N2581 Secondary hyperparathyroidism of renal origin: Secondary | ICD-10-CM | POA: Diagnosis not present

## 2024-03-06 ENCOUNTER — Other Ambulatory Visit: Payer: Self-pay | Admitting: Nurse Practitioner

## 2024-03-06 DIAGNOSIS — N4889 Other specified disorders of penis: Secondary | ICD-10-CM

## 2024-03-07 DIAGNOSIS — N186 End stage renal disease: Secondary | ICD-10-CM | POA: Diagnosis not present

## 2024-03-07 DIAGNOSIS — Z992 Dependence on renal dialysis: Secondary | ICD-10-CM | POA: Diagnosis not present

## 2024-03-07 DIAGNOSIS — N2581 Secondary hyperparathyroidism of renal origin: Secondary | ICD-10-CM | POA: Diagnosis not present

## 2024-03-08 ENCOUNTER — Other Ambulatory Visit: Payer: Self-pay | Admitting: Nurse Practitioner

## 2024-03-08 NOTE — Telephone Encounter (Signed)
 Copied from CRM #[id number]. Topic: Clinical - Medication Refill >> Mar 08, 2024  2:48 PM Joesph B wrote: Medication:  gabapentin  (NEURONTIN ) 300 MG capsule     Has the patient contacted their pharmacy? Yes (Agent: If no, request that the patient contact the pharmacy for the refill. If patient does not wish to contact the pharmacy document the reason why and proceed with request.) (Agent: If yes, when and what did the pharmacy advise?)  This is the patient's preferred pharmacy:  [phone] [address], [place] - [address] [place] [address] Phone: [phone] Fax: [phone]  Is this the correct pharmacy for this prescription? Yes If no, delete pharmacy and type the correct one.   Has the prescription been filled recently? Yes  Is the patient out of the medication? Yes  Has the patient been seen for an appointment in the last year OR does the patient have an upcoming appointment? Yes  Can we respond through MyChart? No.   Agent: Please be advised that Rx refills may take up to 3 business days. We ask that you follow-up with your pharmacy.

## 2024-03-09 DIAGNOSIS — Z992 Dependence on renal dialysis: Secondary | ICD-10-CM | POA: Diagnosis not present

## 2024-03-09 DIAGNOSIS — N186 End stage renal disease: Secondary | ICD-10-CM | POA: Diagnosis not present

## 2024-03-09 MED ORDER — GABAPENTIN 300 MG PO CAPS: 300.0000 mg | ORAL_CAPSULE | Freq: Every day | ORAL | 1 refills | Status: DC

## 2024-03-12 DIAGNOSIS — Z992 Dependence on renal dialysis: Secondary | ICD-10-CM | POA: Diagnosis not present

## 2024-03-12 DIAGNOSIS — N2581 Secondary hyperparathyroidism of renal origin: Secondary | ICD-10-CM | POA: Diagnosis not present

## 2024-03-12 DIAGNOSIS — N186 End stage renal disease: Secondary | ICD-10-CM | POA: Diagnosis not present

## 2024-03-13 DIAGNOSIS — E785 Hyperlipidemia, unspecified: Secondary | ICD-10-CM | POA: Diagnosis not present

## 2024-03-13 DIAGNOSIS — I4891 Unspecified atrial fibrillation: Secondary | ICD-10-CM | POA: Diagnosis not present

## 2024-03-13 DIAGNOSIS — I132 Hypertensive heart and chronic kidney disease with heart failure and with stage 5 chronic kidney disease, or end stage renal disease: Secondary | ICD-10-CM | POA: Diagnosis not present

## 2024-03-13 DIAGNOSIS — I509 Heart failure, unspecified: Secondary | ICD-10-CM | POA: Diagnosis not present

## 2024-03-13 DIAGNOSIS — N186 End stage renal disease: Secondary | ICD-10-CM | POA: Diagnosis not present

## 2024-03-13 DIAGNOSIS — M1990 Unspecified osteoarthritis, unspecified site: Secondary | ICD-10-CM | POA: Diagnosis not present

## 2024-03-13 DIAGNOSIS — E1142 Type 2 diabetes mellitus with diabetic polyneuropathy: Secondary | ICD-10-CM | POA: Diagnosis not present

## 2024-03-13 DIAGNOSIS — E1122 Type 2 diabetes mellitus with diabetic chronic kidney disease: Secondary | ICD-10-CM | POA: Diagnosis not present

## 2024-03-13 DIAGNOSIS — D6869 Other thrombophilia: Secondary | ICD-10-CM | POA: Diagnosis not present

## 2024-03-14 ENCOUNTER — Other Ambulatory Visit: Payer: Self-pay | Admitting: Nurse Practitioner

## 2024-03-14 MED ORDER — GABAPENTIN 300 MG PO CAPS: 300.0000 mg | ORAL_CAPSULE | Freq: Three times a day (TID) | ORAL | 1 refills | Status: DC

## 2024-03-16 DIAGNOSIS — N2581 Secondary hyperparathyroidism of renal origin: Secondary | ICD-10-CM | POA: Diagnosis not present

## 2024-03-16 DIAGNOSIS — Z992 Dependence on renal dialysis: Secondary | ICD-10-CM | POA: Diagnosis not present

## 2024-03-16 DIAGNOSIS — N186 End stage renal disease: Secondary | ICD-10-CM | POA: Diagnosis not present

## 2024-03-19 DIAGNOSIS — N186 End stage renal disease: Secondary | ICD-10-CM | POA: Diagnosis not present

## 2024-03-19 DIAGNOSIS — Z992 Dependence on renal dialysis: Secondary | ICD-10-CM | POA: Diagnosis not present

## 2024-03-21 DIAGNOSIS — N186 End stage renal disease: Secondary | ICD-10-CM | POA: Diagnosis not present

## 2024-03-21 DIAGNOSIS — Z992 Dependence on renal dialysis: Secondary | ICD-10-CM | POA: Diagnosis not present

## 2024-03-21 DIAGNOSIS — N2581 Secondary hyperparathyroidism of renal origin: Secondary | ICD-10-CM | POA: Diagnosis not present

## 2024-03-23 DIAGNOSIS — N2581 Secondary hyperparathyroidism of renal origin: Secondary | ICD-10-CM | POA: Diagnosis not present

## 2024-03-23 DIAGNOSIS — I120 Hypertensive chronic kidney disease with stage 5 chronic kidney disease or end stage renal disease: Secondary | ICD-10-CM | POA: Diagnosis not present

## 2024-03-23 DIAGNOSIS — Z992 Dependence on renal dialysis: Secondary | ICD-10-CM | POA: Diagnosis not present

## 2024-03-23 DIAGNOSIS — N186 End stage renal disease: Secondary | ICD-10-CM | POA: Diagnosis not present

## 2024-03-26 DIAGNOSIS — N186 End stage renal disease: Secondary | ICD-10-CM | POA: Diagnosis not present

## 2024-03-26 DIAGNOSIS — N2581 Secondary hyperparathyroidism of renal origin: Secondary | ICD-10-CM | POA: Diagnosis not present

## 2024-03-26 DIAGNOSIS — Z992 Dependence on renal dialysis: Secondary | ICD-10-CM | POA: Diagnosis not present

## 2024-03-28 ENCOUNTER — Encounter (HOSPITAL_COMMUNITY): Payer: Self-pay | Admitting: *Deleted

## 2024-03-29 DIAGNOSIS — Z992 Dependence on renal dialysis: Secondary | ICD-10-CM | POA: Diagnosis not present

## 2024-03-29 DIAGNOSIS — N2581 Secondary hyperparathyroidism of renal origin: Secondary | ICD-10-CM | POA: Diagnosis not present

## 2024-03-29 DIAGNOSIS — N186 End stage renal disease: Secondary | ICD-10-CM | POA: Diagnosis not present

## 2024-04-01 DIAGNOSIS — Z7901 Long term (current) use of anticoagulants: Secondary | ICD-10-CM | POA: Diagnosis not present

## 2024-04-01 DIAGNOSIS — I480 Paroxysmal atrial fibrillation: Secondary | ICD-10-CM | POA: Diagnosis not present

## 2024-04-06 DIAGNOSIS — Z992 Dependence on renal dialysis: Secondary | ICD-10-CM | POA: Diagnosis not present

## 2024-04-06 DIAGNOSIS — N186 End stage renal disease: Secondary | ICD-10-CM | POA: Diagnosis not present

## 2024-04-11 DIAGNOSIS — Z992 Dependence on renal dialysis: Secondary | ICD-10-CM | POA: Diagnosis not present

## 2024-04-11 DIAGNOSIS — N186 End stage renal disease: Secondary | ICD-10-CM | POA: Diagnosis not present

## 2024-04-11 DIAGNOSIS — N2581 Secondary hyperparathyroidism of renal origin: Secondary | ICD-10-CM | POA: Diagnosis not present

## 2024-04-12 ENCOUNTER — Encounter: Payer: Self-pay | Admitting: Podiatry

## 2024-04-12 ENCOUNTER — Telehealth: Payer: Self-pay | Admitting: Nurse Practitioner

## 2024-04-12 ENCOUNTER — Ambulatory Visit (INDEPENDENT_AMBULATORY_CARE_PROVIDER_SITE_OTHER): Admitting: Podiatry

## 2024-04-12 ENCOUNTER — Ambulatory Visit: Admitting: Nurse Practitioner

## 2024-04-12 DIAGNOSIS — M79675 Pain in left toe(s): Secondary | ICD-10-CM

## 2024-04-12 DIAGNOSIS — M79674 Pain in right toe(s): Secondary | ICD-10-CM | POA: Diagnosis not present

## 2024-04-12 DIAGNOSIS — R051 Acute cough: Secondary | ICD-10-CM | POA: Diagnosis not present

## 2024-04-12 DIAGNOSIS — B351 Tinea unguium: Secondary | ICD-10-CM | POA: Diagnosis not present

## 2024-04-12 DIAGNOSIS — E119 Type 2 diabetes mellitus without complications: Secondary | ICD-10-CM | POA: Diagnosis not present

## 2024-04-12 MED ORDER — HYDROCODONE BIT-HOMATROP MBR 5-1.5 MG/5ML PO SOLN: 5.0000 mL | Freq: Four times a day (QID) | ORAL | 0 refills | Status: AC | PRN

## 2024-04-12 MED ORDER — ALBUTEROL SULFATE HFA 108 (90 BASE) MCG/ACT IN AERS: 2.0000 | INHALATION_SPRAY | Freq: Four times a day (QID) | RESPIRATORY_TRACT | 5 refills | Status: AC | PRN

## 2024-04-12 NOTE — Progress Notes (Signed)
 This patient returns to my office for at risk foot care.  This patient requires this care by a professional since this patient will be at risk due to having DM, chronic anticoagulation and ESRD.  This patient is unable to cut nails himself since the patient cannot reach his nails.These nails are painful walking and wearing shoes.  This patient presents for at risk foot care today.  General Appearance  Alert, conversant and in no acute stress.  Vascular  Dorsalis pedis and posterior tibial  pulses are palpable  bilaterally.  Capillary return is within normal limits  bilaterally. Temperature is within normal limits  bilaterally.  Neurologic  Senn-Weinstein monofilament wire test within normal limits  bilaterally. Muscle power within normal limits bilaterally.  Nails Thick disfigured discolored nails with subungual debris  from hallux to fifth toes bilaterally. No evidence of bacterial infection or drainage bilaterally.  Orthopedic  No limitations of motion  feet .  No crepitus or effusions noted.  No bony pathology or digital deformities noted.  Skin  normotropic skin with no porokeratosis noted bilaterally.  No signs of infections or ulcers noted.     Onychomycosis  Pain in right toes  Pain in left toes  Consent was obtained for treatment procedures.   Mechanical debridement of nails 1-5  bilaterally performed with a nail nipper.  Filed with dremel without incident.    Return office visit     3   months                 Told patient to return for periodic foot care and evaluation due to potential at risk complications.   Helane Gunther DPM

## 2024-04-12 NOTE — Progress Notes (Signed)
 Virtual Visit via Video Note  LILLETTE Kristeen JINNY Gladis, CMA,acting as a scribe for Gaines Ada, FNP.,have documented all relevant documentation on the behalf of Gaines Ada, FNP,as directed by  Gaines Ada, FNP while in the presence of Gaines Ada, FNP.  I connected with [name] on 04/24/24 at  4:20 PM EST by a video enabled telemedicine application and verified that I am speaking with the correct person using two identifiers.  Patient Location: Other:  in the passenger seat of the car Provider Location: Office/Clinic  I discussed the limitations, risks, security, and privacy concerns of performing an evaluation and management service by video and the availability of in person appointments. I also discussed with the patient that there may be a patient responsible charge related to this service. The patient expressed understanding and agreed to proceed.  Subjective: PCP: Ada Gaines, FNP  Chief Complaint  Patient presents with   Cough    Patient presents today for a cough, patient reports he has had a cough for about 2 weeks. Patient reports it is not getting better, but it is worse at night. He reports he has a lot of mucus as well. Denies fever or runny nose.    He has not been having a fever. Denies shortness of breath. The mucous is clear. He reports he went to the health department and they treated him for syphyllis but they were unable to see anything but wanted to treat prophylactically.    ROS: Per HPI  Current Outpatient Medications:    acetaminophen  (TYLENOL ) 500 MG tablet, Take 500 mg by mouth as needed., Disp: , Rfl:    atorvastatin  (LIPITOR) 40 MG tablet, Take 1 tablet (40 mg total) by mouth daily., Disp: 90 tablet, Rfl: 3   calcium  acetate (PHOSLO ) 667 MG capsule, Take 1-3 capsules (667-2,001 mg total) by mouth See admin instructions. Take 2001 mg capsules with meals three times daily and 667 mg capsule with snacks., Disp:  , Rfl:    clotrimazole  (LOTRIMIN ) 1 %  cream, APPLY TO AFFECTED AREA TWICE A DAY, Disp: 30 g, Rfl: 0   digoxin  (LANOXIN ) 0.125 MG tablet, Take 1 tablet (0.125 mg total) by mouth every Monday, Wednesday, and Friday., Disp: 30 tablet, Rfl: 1   gabapentin  (NEURONTIN ) 300 MG capsule, Take 1 capsule (300 mg total) by mouth 3 (three) times daily., Disp: 270 capsule, Rfl: 1   glucose blood test strip, Use to check blood sugar 3 times a day. Dx code e11.65, Disp: 100 each, Rfl: 12   Lancets (ONETOUCH DELICA PLUS LANCET33G) MISC, Use to check blood sugar 3 times a day. Dx code e11.65, Disp: 100 each, Rfl: 3   loperamide  (IMODIUM  A-D) 2 MG tablet, Take 1 tablet (2 mg total) by mouth 4 (four) times daily as needed for diarrhea or loose stools. Also available OTC., Disp: 30 tablet, Rfl: 0   midodrine  (PROAMATINE ) 10 MG tablet, Take 1 tablet (10 mg total) by mouth 3 (three) times daily., Disp: 90 tablet, Rfl: 3   pantoprazole  (PROTONIX ) 40 MG tablet, Take 40 mg by mouth 2 (two) times daily., Disp: , Rfl:    UNABLE TO FIND, Out patient physical therapy- vestibular therapy for BPPV, Disp: 1 each, Rfl: 0   warfarin (COUMADIN ) 7.5 MG tablet, Take 10 mg by mouth daily., Disp: , Rfl:    albuterol  (VENTOLIN  HFA) 108 (90 Base) MCG/ACT inhaler, Inhale 2 puffs into the lungs every 6 (six) hours as needed for wheezing or shortness of breath., Disp:  18 g, Rfl: 5   Control Gel Formula Dressing (DUODERM CGF EXTRA THIN) MISC, Apply 1 each topically daily as needed. (Patient not taking: Reported on 04/12/2024), Disp: 10 each, Rfl: 2   HYDROcodone  bit-homatropine (HYDROMET) 5-1.5 MG/5ML syrup, Take 5 mLs by mouth every 6 (six) hours as needed for cough., Disp: 120 mL, Rfl: 0  Observations/Objective: There were no vitals filed for this visit. Physical Exam Vitals and nursing note reviewed.  Constitutional:      General: He is not in acute distress.    Appearance: Normal appearance. He is obese.  Pulmonary:     Effort: Pulmonary effort is normal. No respiratory  distress.  Skin:    Capillary Refill: Capillary refill takes less than 2 seconds.  Neurological:     General: No focal deficit present.     Mental Status: He is oriented to person, place, and time.  Psychiatric:        Mood and Affect: Mood normal.        Behavior: Behavior normal.        Thought Content: Thought content normal.        Judgment: Judgment normal.     Assessment and Plan: Acute cough Assessment & Plan: Persistent cough not responsive to over the counter medications. Will treat with albuterol  inhaler as needed.   Orders: -     Albuterol  Sulfate HFA; Inhale 2 puffs into the lungs every 6 (six) hours as needed for wheezing or shortness of breath.  Dispense: 18 g; Refill: 5 -     HYDROcodone  Bit-Homatrop MBr; Take 5 mLs by mouth every 6 (six) hours as needed for cough.  Dispense: 120 mL; Refill: 0    Follow Up Instructions: Return for keep same next.   I discussed the assessment and treatment plan with the patient. The patient was provided an opportunity to ask questions, and all were answered. The patient agreed with the plan and demonstrated an understanding of the instructions.   The patient was advised to call back or seek an in-person evaluation if the symptoms worsen or if the condition fails to improve as anticipated.  The above assessment and management plan was discussed with the patient. The patient verbalized understanding of and has agreed to the management plan.   LILLETTE Gaines Ada, FNP, have reviewed all documentation for this visit. The documentation on 04/12/24 for the exam, diagnosis, procedures, and orders are all accurate and complete.

## 2024-04-13 DIAGNOSIS — Z992 Dependence on renal dialysis: Secondary | ICD-10-CM | POA: Diagnosis not present

## 2024-04-17 DIAGNOSIS — N186 End stage renal disease: Secondary | ICD-10-CM | POA: Diagnosis not present

## 2024-04-20 DIAGNOSIS — Z992 Dependence on renal dialysis: Secondary | ICD-10-CM | POA: Diagnosis not present

## 2024-04-20 DIAGNOSIS — N2581 Secondary hyperparathyroidism of renal origin: Secondary | ICD-10-CM | POA: Diagnosis not present

## 2024-04-20 DIAGNOSIS — N186 End stage renal disease: Secondary | ICD-10-CM | POA: Diagnosis not present

## 2024-04-22 DIAGNOSIS — N186 End stage renal disease: Secondary | ICD-10-CM | POA: Diagnosis not present

## 2024-04-22 DIAGNOSIS — Z992 Dependence on renal dialysis: Secondary | ICD-10-CM | POA: Diagnosis not present

## 2024-04-22 DIAGNOSIS — I120 Hypertensive chronic kidney disease with stage 5 chronic kidney disease or end stage renal disease: Secondary | ICD-10-CM | POA: Diagnosis not present

## 2024-04-24 ENCOUNTER — Encounter: Payer: Self-pay | Admitting: Nurse Practitioner

## 2024-04-24 DIAGNOSIS — R051 Acute cough: Secondary | ICD-10-CM | POA: Insufficient documentation

## 2024-04-24 NOTE — Assessment & Plan Note (Signed)
 Persistent cough not responsive to over the counter medications. Will treat with albuterol  inhaler as needed.

## 2024-04-26 ENCOUNTER — Other Ambulatory Visit: Payer: Self-pay | Admitting: Nurse Practitioner

## 2024-04-26 DIAGNOSIS — R051 Acute cough: Secondary | ICD-10-CM

## 2024-04-26 NOTE — Telephone Encounter (Unsigned)
 Copied from CRM #[id number]. Topic: Clinical - Medication Refill >> Apr 26, 2024  1:02 PM Ashley R wrote: Medication: HYDROcodone  bit-homatropine (HYDROMET) 5-1.5 MG/5ML syrup  Has the patient contacted their pharmacy? Yes  This is the patient's preferred pharmacy:  [phone] [address], [place] - [address] [place] [address] Phone: [phone] Fax: [phone]  Is this the correct pharmacy for this prescription? Yes If no, delete pharmacy and type the correct one.   Has the prescription been filled recently? Yes  Is the patient out of the medication? Yes  Has the patient been seen for an appointment in the last year OR does the patient have an upcoming appointment? Yes  Can we respond through MyChart? No  Agent: Please be advised that Rx refills may take up to 3 business days. We ask that you follow-up with your pharmacy.

## 2024-04-29 ENCOUNTER — Other Ambulatory Visit: Payer: Self-pay | Admitting: Family Medicine

## 2024-04-29 MED ORDER — PROMETHAZINE-DM 6.25-15 MG/5ML PO SYRP: 5.0000 mL | ORAL_SOLUTION | Freq: Two times a day (BID) | ORAL | 0 refills | Status: DC | PRN

## 2024-05-18 ENCOUNTER — Observation Stay (HOSPITAL_BASED_OUTPATIENT_CLINIC_OR_DEPARTMENT_OTHER)
Admission: EM | Admit: 2024-05-18 | Discharge: 2024-05-20 | Disposition: A | Attending: Internal Medicine | Admitting: Internal Medicine

## 2024-05-18 ENCOUNTER — Encounter (HOSPITAL_BASED_OUTPATIENT_CLINIC_OR_DEPARTMENT_OTHER): Payer: Self-pay

## 2024-05-18 ENCOUNTER — Other Ambulatory Visit: Payer: Self-pay

## 2024-05-18 ENCOUNTER — Emergency Department (HOSPITAL_BASED_OUTPATIENT_CLINIC_OR_DEPARTMENT_OTHER): Admitting: Radiology

## 2024-05-18 DIAGNOSIS — E162 Hypoglycemia, unspecified: Secondary | ICD-10-CM | POA: Diagnosis present

## 2024-05-18 DIAGNOSIS — Z96612 Presence of left artificial shoulder joint: Secondary | ICD-10-CM | POA: Diagnosis not present

## 2024-05-18 DIAGNOSIS — J811 Chronic pulmonary edema: Secondary | ICD-10-CM | POA: Diagnosis present

## 2024-05-18 DIAGNOSIS — Z8673 Personal history of transient ischemic attack (TIA), and cerebral infarction without residual deficits: Secondary | ICD-10-CM | POA: Diagnosis not present

## 2024-05-18 DIAGNOSIS — I480 Paroxysmal atrial fibrillation: Secondary | ICD-10-CM | POA: Diagnosis present

## 2024-05-18 DIAGNOSIS — Z86718 Personal history of other venous thrombosis and embolism: Secondary | ICD-10-CM | POA: Insufficient documentation

## 2024-05-18 DIAGNOSIS — E1140 Type 2 diabetes mellitus with diabetic neuropathy, unspecified: Secondary | ICD-10-CM | POA: Insufficient documentation

## 2024-05-18 DIAGNOSIS — I953 Hypotension of hemodialysis: Secondary | ICD-10-CM | POA: Diagnosis not present

## 2024-05-18 DIAGNOSIS — I5043 Acute on chronic combined systolic (congestive) and diastolic (congestive) heart failure: Secondary | ICD-10-CM | POA: Insufficient documentation

## 2024-05-18 DIAGNOSIS — I509 Heart failure, unspecified: Principal | ICD-10-CM

## 2024-05-18 DIAGNOSIS — I132 Hypertensive heart and chronic kidney disease with heart failure and with stage 5 chronic kidney disease, or end stage renal disease: Secondary | ICD-10-CM | POA: Insufficient documentation

## 2024-05-18 DIAGNOSIS — Z7901 Long term (current) use of anticoagulants: Secondary | ICD-10-CM | POA: Diagnosis not present

## 2024-05-18 DIAGNOSIS — G8929 Other chronic pain: Secondary | ICD-10-CM | POA: Diagnosis not present

## 2024-05-18 DIAGNOSIS — I5042 Chronic combined systolic (congestive) and diastolic (congestive) heart failure: Secondary | ICD-10-CM | POA: Insufficient documentation

## 2024-05-18 DIAGNOSIS — Z6830 Body mass index (BMI) 30.0-30.9, adult: Secondary | ICD-10-CM | POA: Insufficient documentation

## 2024-05-18 DIAGNOSIS — Z79899 Other long term (current) drug therapy: Secondary | ICD-10-CM | POA: Insufficient documentation

## 2024-05-18 DIAGNOSIS — J810 Acute pulmonary edema: Secondary | ICD-10-CM | POA: Diagnosis not present

## 2024-05-18 DIAGNOSIS — K219 Gastro-esophageal reflux disease without esophagitis: Secondary | ICD-10-CM | POA: Diagnosis not present

## 2024-05-18 DIAGNOSIS — R059 Cough, unspecified: Secondary | ICD-10-CM | POA: Diagnosis present

## 2024-05-18 DIAGNOSIS — E669 Obesity, unspecified: Secondary | ICD-10-CM | POA: Diagnosis not present

## 2024-05-18 DIAGNOSIS — R0602 Shortness of breath: Secondary | ICD-10-CM | POA: Diagnosis present

## 2024-05-18 DIAGNOSIS — I2510 Atherosclerotic heart disease of native coronary artery without angina pectoris: Secondary | ICD-10-CM | POA: Diagnosis not present

## 2024-05-18 DIAGNOSIS — G629 Polyneuropathy, unspecified: Secondary | ICD-10-CM | POA: Insufficient documentation

## 2024-05-18 DIAGNOSIS — E1122 Type 2 diabetes mellitus with diabetic chronic kidney disease: Secondary | ICD-10-CM | POA: Diagnosis present

## 2024-05-18 DIAGNOSIS — Z992 Dependence on renal dialysis: Secondary | ICD-10-CM | POA: Diagnosis not present

## 2024-05-18 DIAGNOSIS — Z96611 Presence of right artificial shoulder joint: Secondary | ICD-10-CM | POA: Diagnosis not present

## 2024-05-18 DIAGNOSIS — N186 End stage renal disease: Secondary | ICD-10-CM | POA: Insufficient documentation

## 2024-05-18 DIAGNOSIS — D631 Anemia in chronic kidney disease: Secondary | ICD-10-CM | POA: Diagnosis not present

## 2024-05-18 DIAGNOSIS — E785 Hyperlipidemia, unspecified: Secondary | ICD-10-CM | POA: Diagnosis present

## 2024-05-18 DIAGNOSIS — E11649 Type 2 diabetes mellitus with hypoglycemia without coma: Secondary | ICD-10-CM | POA: Insufficient documentation

## 2024-05-18 LAB — CBC WITH DIFFERENTIAL/PLATELET
Abs Immature Granulocytes: 0.02 K/uL (ref 0.00–0.07)
Basophils Absolute: 0.1 K/uL (ref 0.0–0.1)
Basophils Relative: 1 %
Eosinophils Absolute: 0.1 K/uL (ref 0.0–0.5)
Eosinophils Relative: 1 %
HCT: 31.1 % — ABNORMAL LOW (ref 39.0–52.0)
Hemoglobin: 10.1 g/dL — ABNORMAL LOW (ref 13.0–17.0)
Immature Granulocytes: 0 %
Lymphocytes Relative: 22 %
Lymphs Abs: 1.4 K/uL (ref 0.7–4.0)
MCH: 29.5 pg (ref 26.0–34.0)
MCHC: 32.5 g/dL (ref 30.0–36.0)
MCV: 90.9 fL (ref 80.0–100.0)
Monocytes Absolute: 0.6 K/uL (ref 0.1–1.0)
Monocytes Relative: 9 %
Neutro Abs: 4.1 K/uL (ref 1.7–7.7)
Neutrophils Relative %: 67 %
Platelets: 201 K/uL (ref 150–400)
RBC: 3.42 MIL/uL — ABNORMAL LOW (ref 4.22–5.81)
RDW: 18.3 % — ABNORMAL HIGH (ref 11.5–15.5)
WBC: 6.2 K/uL (ref 4.0–10.5)
nRBC: 0 % (ref 0.0–0.2)

## 2024-05-18 LAB — BASIC METABOLIC PANEL WITH GFR
Anion gap: 12 (ref 5–15)
BUN: 12 mg/dL (ref 8–23)
CO2: 30 mmol/L (ref 22–32)
Calcium: 10.3 mg/dL (ref 8.9–10.3)
Chloride: 96 mmol/L — ABNORMAL LOW (ref 98–111)
Creatinine, Ser: 5.42 mg/dL — ABNORMAL HIGH (ref 0.61–1.24)
GFR, Estimated: 11 mL/min — ABNORMAL LOW
Glucose, Bld: 66 mg/dL — ABNORMAL LOW (ref 70–99)
Potassium: 3.5 mmol/L (ref 3.5–5.1)
Sodium: 138 mmol/L (ref 135–145)

## 2024-05-18 LAB — RESP PANEL BY RT-PCR (RSV, FLU A&B, COVID)  RVPGX2
Influenza A by PCR: NEGATIVE
Influenza B by PCR: NEGATIVE
Resp Syncytial Virus by PCR: NEGATIVE
SARS Coronavirus 2 by RT PCR: NEGATIVE

## 2024-05-18 LAB — PRO BRAIN NATRIURETIC PEPTIDE: Pro Brain Natriuretic Peptide: >35000 pg/mL — ABNORMAL HIGH

## 2024-05-18 NOTE — ED Provider Notes (Signed)
 " [place] EMERGENCY DEPARTMENT AT [facility] Provider Note   CSN: [id number] Arrival date & time: 05/18/24  1150     Patient presents with: Cough   [name] is a 63 y.o. male with past medical history of HTN, HLD, T2DM, ESRD on HD, diverticulitis, PAF, anemia, CVA presents to Emergency Department for evaluation of nonproductive cough, rhinorrhea, nasal congestion, fatigue, decreased appetite, shortness of breath that has been occurring over the past 2 weeks.  Shortness of breath worsens with exertion and with laying flat.  No known sick contacts.  No NVD nor abdominal pain.  Goes to hemodialysis on MWF with last 1 today and had entire dialysis treatment.  Denies chest pain, known fevers    Cough      Prior to Admission medications  Medication Sig Start Date End Date Taking? Authorizing Provider  oxyCODONE  (OXY IR/ROXICODONE ) 5 MG immediate release tablet Take 5 mg by mouth every 8 (eight) hours as needed for severe pain (pain score 7-10). 11/03/23  Yes [provider]  acetaminophen  (TYLENOL ) 500 MG tablet Take 500 mg by mouth as needed.    [provider]  albuterol  (VENTOLIN  HFA) 108 (90 Base) MCG/ACT inhaler Inhale 2 puffs into the lungs every 6 (six) hours as needed for wheezing or shortness of breath. 04/12/24   Georgina Speaks, FNP  atorvastatin  (LIPITOR) 40 MG tablet Take 1 tablet (40 mg total) by mouth daily. 10/14/23   Patsy Lenis, MD  calcium  acetate (PHOSLO ) 667 MG capsule Take 1-3 capsules (667-2,001 mg total) by mouth See admin instructions. Take 2001 mg capsules with meals three times daily and 667 mg capsule with snacks. 11/23/18   Claudene Pacific, MD  clotrimazole  (LOTRIMIN ) 1 % cream APPLY TO AFFECTED AREA TWICE A DAY 03/07/24   Moore, Janece, FNP  Control Gel Formula Dressing (DUODERM CGF EXTRA THIN) MISC Apply 1 each topically daily as needed. Patient not taking: Reported on 04/12/2024 10/26/20   Moore, Janece, FNP  digoxin  (LANOXIN ) 0.125  MG tablet Take 1 tablet (0.125 mg total) by mouth every Monday, Wednesday, and Friday. 06/09/20   Rai, Nydia POUR, MD  gabapentin  (NEURONTIN ) 300 MG capsule Take 1 capsule (300 mg total) by mouth 3 (three) times daily. 03/14/24   Georgina Speaks, FNP  glucose blood test strip Use to check blood sugar 3 times a day. 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 04/12/24   Georgina Speaks, FNP  Lancets [facility] DELICA PLUS [place]) MISC Use to check blood sugar 3 times a day. Dx code e11.65 10/13/21   Moore, Janece, FNP  loperamide  (IMODIUM  A-D) 2 MG tablet Take 1 tablet (2 mg total) by mouth 4 (four) times daily as needed for diarrhea or loose stools. Also available OTC. 06/09/20   Rai, Nydia POUR, MD  midodrine  (PROAMATINE ) 10 MG tablet Take 1 tablet (10 mg total) by mouth 3 (three) times daily. 06/09/20   Rai, Nydia POUR, MD  pantoprazole  (PROTONIX ) 40 MG tablet Take 40 mg by mouth 2 (two) times daily.    [provider]  promethazine -dextromethorphan  (PROMETHAZINE -DM) 6.25-15 MG/5ML syrup Take 5 mLs by mouth 2 (two) times daily as needed for cough. 04/29/24   Petrina Pries, NP  UNABLE TO FIND Out patient physical therapy- vestibular therapy for BPPV 08/01/18   Rizwan, Saima, MD  VELPHORO  500 MG chewable tablet Chew 1,500 mg by mouth  3 (three) times daily.    [provider]  warfarin (COUMADIN ) 7.5 MG tablet Take 10 mg by mouth daily.    [provider]    Allergies: Ace inhibitors    Review of Systems  Respiratory:  Positive for cough.     Updated Vital Signs BP 125/84 (BP Location: Right Arm)   Pulse 94   Temp 98 F (36.7 C) (Oral)   Resp 19   Ht 6' 1 (1.854 m)   Wt 104.3 kg   SpO2 97%   BMI 30.34 kg/m   Physical Exam Vitals and nursing note reviewed.  Constitutional:      General: He is not in acute distress.    Appearance: Normal  appearance. He is not ill-appearing.     Comments: Wheelchair-bound  HENT:     Head: Normocephalic and atraumatic.  Eyes:     Conjunctiva/sclera: Conjunctivae normal.  Cardiovascular:     Rate and Rhythm: Normal rate.  Pulmonary:     Effort: Pulmonary effort is normal. No respiratory distress.     Breath sounds: Normal breath sounds.     Comments: Speaking in full complete sentences without difficulty.  Maintain oxygen  saturation without supplementation Musculoskeletal:     Right lower leg: Edema present.     Left lower leg: Edema present.  Skin:    Coloration: Skin is not jaundiced or pale.  Neurological:     Mental Status: He is alert and oriented to person, place, and time. Mental status is at baseline.     (all labs ordered are listed, but only abnormal results are displayed) Labs Reviewed  CBC WITH DIFFERENTIAL/PLATELET - Abnormal; Notable for the following components:      Result Value   RBC 3.42 (*)    Hemoglobin 10.1 (*)    HCT 31.1 (*)    RDW 18.3 (*)    All other components within normal limits  BASIC METABOLIC PANEL WITH GFR - Abnormal; Notable for the following components:   Chloride 96 (*)    Glucose, Bld 66 (*)    Creatinine, Ser 5.42 (*)    GFR, Estimated 11 (*)    All other components within normal limits  PRO BRAIN NATRIURETIC PEPTIDE - Abnormal; Notable for the following components:   Pro Brain Natriuretic Peptide >35,000.0 (*)    All other components within normal limits  RESP PANEL BY RT-PCR (RSV, FLU A&B, COVID)  [id number]    EKG: None  Radiology: DG Chest 2 View Result Date: 05/18/2024 EXAM: 2 VIEW(S) XRAY OF THE CHEST 05/18/2024 12:35:00 PM COMPARISON: 03/18/2023 CLINICAL HISTORY: SOB FINDINGS: LUNGS AND PLEURA: Left base airspace consolidation. Trace bilateral pleural effusions. Increased interstitial markings with cephalization and possible developing Kerley B-lines, probably pulmonary edema. No pneumothorax. HEART AND MEDIASTINUM: Cardiomegaly.  Atherosclerotic plaque. BONES AND SOFT TISSUES: Bilateral shoulder arthroplasty noted. No acute osseous abnormality. IMPRESSION: 1. Increased  pulmonary edema. 2. Left base airspace consolidation. 3. Trace bilateral pleural effusions. 4. Cardiomegaly. Electronically signed by: Dayne Hassell MD 05/18/2024 01:09 PM EST RP Workstation: [id number]     Medications Ordered in the ED - No data to display  Clinical Course as of 05/19/24 1523  Sat May 19, 2024  0032 DG Chest 2 View [JL]    Clinical Course User Index [JL] Jerrol Agent, MD                                 Medical Decision  Making Amount and/or Complexity of Data Reviewed Labs: ordered. Radiology: ordered. Decision-making details documented in ED Course.  Risk Decision regarding hospitalization.   Patient presents to the ED for concern of shortness of breath, cough, congestion, fever, this involves an extensive number of treatment options, and is a complaint that carries with it a high risk of complications and morbidity.  The differential diagnosis includes, flu, RSV, pneumonia, bronchitis, fluid overload, ACS   Co morbidities that complicate the patient evaluation  Multiple.  See HPI   Additional history obtained:  Additional history obtained from Nursing   External records from outside source obtained and reviewed including triage note   Lab Tests:  I Ordered, and personally interpreted labs.  The pertinent results include:   BNP greater than 35,000 Hgb 10.1 Creatinine 5.41 (ESRD on HD) CBG 66 Respiratory panel negative   Imaging Studies ordered:  I ordered imaging studies including chest x-ray I independently visualized and interpreted imaging which showed  Increased  pulmonary edema. Left base airspace consolidation. Trace bilateral pleural effusions. Cardiomegaly. I agree with the radiologist interpretation   Cardiac Monitoring:  The patient was maintained on a cardiac monitor.  I personally  viewed and interpreted the cardiac monitored which showed an underlying rhythm of: NSR with LBBB similar to previous    Problem List / ED Course:  SHOB Cough Congestion Fever Acute on chronic HF Vital signs hemodynamically stable no fever or tachycardia Respiratory panel negative Chest x-ray negative for pneumonia but does show pulmonary edema Patient appears fluid overloaded on exam with pitting edema 2+.  Is speaking in full complete sentences and maintaining his oxygen  saturation without supplementation.  No signs of acute respiratory distress.  Not needing BiPAP at this time.   Last dialysis was today and had full treatment.  Does not need emergent dialysis.  Potassium WNL. Reports that he continues to feel short of breath even after dialysis treatment.  Shortness of breath worsens with any sort of exertion, movement. BNP greater than 35,000.  Last BNP was a year ago was WNL. Last echo 10/12/2023 notable for LVEF 25-30% Patient does not make urine.  Not on Lasix . Patient would benefit from admission for fluid overload, additional dialysis treatment   Reevaluation:  After the interventions noted above, I reevaluated the patient and found that they have :stayed the same    Dispostion:  After consideration of the diagnostic results and the patients response to treatment, I feel that the patent would benefit from admission for fluid overload. Sign out to Brier PA pending admission consult  Discussed patient with my attending Dr. Pamella who reviewed ED workup, chest x-ray, and agrees with plan of admission  Final diagnoses:  Acute on chronic heart failure, unspecified heart failure type [facility])    ED Discharge Orders     None        Minnie Tinnie BRAVO, PA 05/19/24 1527  "

## 2024-05-18 NOTE — ED Notes (Signed)
"   Side hallway "

## 2024-05-18 NOTE — ED Triage Notes (Signed)
 Pt reports cough, congestion, sob, fever x2 weeks.

## 2024-05-18 NOTE — ED Provider Notes (Signed)
" °  Signout from Tinnie Matter PA-C at shift change. Briefly, patient presents for non productive cough, rhinorrhea, nasal congestion, fatigue, decreased appetite, shortness of breath for the past 2 weeks. Medical history pertinent for HTN, HLD, T2DM, ESRD on HD, diverticulitis, PAF, anemia, and CVA. Echo on 5/25 shows EF 30%.   Plan: Admission to hospital for continued observation and likely additional dialysis. Pending hospitalist consult.   Labs and imaging personally reviewed and interpreted including: Chest xray shows evidence of pulmonary edema. Does not meet SIRS criteria.   Most current vital signs reviewed and are as follows: BP 125/84 (BP Location: Right Arm)   Pulse 94   Temp 98 F (36.7 C) (Oral)   Resp 19   Ht 6' 1 (1.854 m)   Wt 104.3 kg   SpO2 97%   BMI 30.34 kg/m  Satting well on room air.   Hospitalist consult still pending at provider sign out. Care transferred to Lynwood Moulder MD.  This note was produced using Dragon [employer] voice recognition. While the provider has reviewed and verified all clinical information, transcription errors may remain.     Rosina Almarie LABOR, PA-C 05/19/24 [phone]  "

## 2024-05-18 NOTE — ED Provider Notes (Incomplete)
 Provider sign out, received patient care from Tinnie Matter PA-C.  Plan: Pending hospitalist consult for admission.   Problem List / ED Course:     ***   Dispostion:  ***  Signout from *** at shift change. Briefly, patient presents for ***   Plan: ***    11:27 PM Reassessment performed. Patient appears ***  Labs and imaging personally reviewed and interpreted including: ***   Consultations Obtained:  I requested consultation with the ***,  and discussed lab and imaging findings as well as pertinent plan - they recommend: ***  Most current vital signs reviewed and are as follows: BP 125/84 (BP Location: Right Arm)   Pulse 94   Temp 98 F (36.7 C) (Oral)   Resp 19   Ht 6' 1 (1.854 m)   Wt 104.3 kg   SpO2 97%   BMI 30.34 kg/m     Plan: ***   Home treatment: ***   Return and follow-up instructions: Encouraged return to ED with ***. Encouraged patient to follow-up with their provider in *** days. Patient verbalized understanding and agreed with plan.

## 2024-05-19 ENCOUNTER — Encounter (HOSPITAL_COMMUNITY): Payer: Self-pay | Admitting: Internal Medicine

## 2024-05-19 ENCOUNTER — Emergency Department (HOSPITAL_BASED_OUTPATIENT_CLINIC_OR_DEPARTMENT_OTHER)

## 2024-05-19 DIAGNOSIS — I5042 Chronic combined systolic (congestive) and diastolic (congestive) heart failure: Secondary | ICD-10-CM | POA: Diagnosis not present

## 2024-05-19 DIAGNOSIS — Z6830 Body mass index (BMI) 30.0-30.9, adult: Secondary | ICD-10-CM | POA: Diagnosis not present

## 2024-05-19 DIAGNOSIS — E11649 Type 2 diabetes mellitus with hypoglycemia without coma: Secondary | ICD-10-CM | POA: Diagnosis not present

## 2024-05-19 DIAGNOSIS — J811 Chronic pulmonary edema: Secondary | ICD-10-CM | POA: Diagnosis present

## 2024-05-19 DIAGNOSIS — E669 Obesity, unspecified: Secondary | ICD-10-CM

## 2024-05-19 DIAGNOSIS — E1129 Type 2 diabetes mellitus with other diabetic kidney complication: Secondary | ICD-10-CM | POA: Diagnosis not present

## 2024-05-19 DIAGNOSIS — Z992 Dependence on renal dialysis: Secondary | ICD-10-CM | POA: Diagnosis not present

## 2024-05-19 DIAGNOSIS — E162 Hypoglycemia, unspecified: Secondary | ICD-10-CM | POA: Diagnosis present

## 2024-05-19 DIAGNOSIS — Z743 Need for continuous supervision: Secondary | ICD-10-CM | POA: Diagnosis not present

## 2024-05-19 DIAGNOSIS — J810 Acute pulmonary edema: Principal | ICD-10-CM

## 2024-05-19 DIAGNOSIS — I480 Paroxysmal atrial fibrillation: Secondary | ICD-10-CM | POA: Diagnosis not present

## 2024-05-19 DIAGNOSIS — I509 Heart failure, unspecified: Secondary | ICD-10-CM | POA: Diagnosis present

## 2024-05-19 DIAGNOSIS — N186 End stage renal disease: Secondary | ICD-10-CM | POA: Diagnosis not present

## 2024-05-19 LAB — PROTIME-INR
INR: 1.4 — ABNORMAL HIGH (ref 0.8–1.2)
Prothrombin Time: 18.3 s — ABNORMAL HIGH (ref 11.4–15.2)

## 2024-05-19 LAB — GLUCOSE, CAPILLARY
Glucose-Capillary: 86 mg/dL (ref 70–99)
Glucose-Capillary: 70 mg/dL (ref 70–99)

## 2024-05-19 LAB — DIGOXIN LEVEL: Digoxin Level: <0.6 ng/mL — ABNORMAL LOW (ref 0.8–2.0)

## 2024-05-19 LAB — CBG MONITORING, ED
Glucose-Capillary: 70 mg/dL (ref 70–99)
Glucose-Capillary: 79 mg/dL (ref 70–99)

## 2024-05-19 MED ORDER — NEPRO/CARBSTEADY PO LIQD
237.0000 mL | Freq: Two times a day (BID) | ORAL | Status: DC
  Administered 2024-05-20: 237 mL via ORAL

## 2024-05-19 MED ORDER — SODIUM CHLORIDE 0.9 % IV SOLN
1.0000 g | Freq: Once | INTRAVENOUS | Status: AC
  Administered 2024-05-19: 1 g via INTRAVENOUS
  Filled 2024-05-19: qty 10

## 2024-05-19 MED ORDER — LOPERAMIDE HCL 2 MG PO CAPS: 2.0000 mg | ORAL_CAPSULE | Freq: Four times a day (QID) | ORAL | Status: DC | PRN

## 2024-05-19 MED ORDER — ATORVASTATIN CALCIUM 40 MG PO TABS
40.0000 mg | ORAL_TABLET | Freq: Every day | ORAL | Status: DC
  Administered 2024-05-19 – 2024-05-20 (×2): 40 mg via ORAL
  Filled 2024-05-19 (×2): qty 1

## 2024-05-19 MED ORDER — GUAIFENESIN-DM 100-10 MG/5ML PO SYRP: 5.0000 mL | ORAL_SOLUTION | Freq: Two times a day (BID) | ORAL | Status: DC | PRN

## 2024-05-19 MED ORDER — GUAIFENESIN ER 600 MG PO TB12
600.0000 mg | ORAL_TABLET | Freq: Two times a day (BID) | ORAL | Status: DC
  Administered 2024-05-19 – 2024-05-20 (×3): 600 mg via ORAL
  Filled 2024-05-19 (×3): qty 1

## 2024-05-19 MED ORDER — PENTAFLUOROPROP-TETRAFLUOROETH EX AERO: 1.0000 | INHALATION_SPRAY | CUTANEOUS | Status: DC | PRN

## 2024-05-19 MED ORDER — AZITHROMYCIN 500 MG PO TABS
500.0000 mg | ORAL_TABLET | Freq: Every day | ORAL | Status: DC
  Administered 2024-05-20: 500 mg via ORAL
  Filled 2024-05-19: qty 1

## 2024-05-19 MED ORDER — OXYCODONE HCL 5 MG PO TABS: 5.0000 mg | ORAL_TABLET | Freq: Three times a day (TID) | ORAL | Status: DC | PRN

## 2024-05-19 MED ORDER — ACETAMINOPHEN 325 MG PO TABS: 650.0000 mg | ORAL_TABLET | Freq: Four times a day (QID) | ORAL | Status: DC | PRN

## 2024-05-19 MED ORDER — PANTOPRAZOLE SODIUM 40 MG PO TBEC
40.0000 mg | DELAYED_RELEASE_TABLET | Freq: Two times a day (BID) | ORAL | Status: DC
  Administered 2024-05-19 – 2024-05-20 (×3): 40 mg via ORAL
  Filled 2024-05-19 (×3): qty 1

## 2024-05-19 MED ORDER — SENNOSIDES-DOCUSATE SODIUM 8.6-50 MG PO TABS: 1.0000 | ORAL_TABLET | Freq: Every evening | ORAL | Status: DC | PRN

## 2024-05-19 MED ORDER — SODIUM CHLORIDE 0.9 % IV SOLN
2.0000 g | INTRAVENOUS | Status: DC
  Administered 2024-05-20: 2 g via INTRAVENOUS
  Filled 2024-05-19: qty 20

## 2024-05-19 MED ORDER — HEPARIN SODIUM (PORCINE) 1000 UNIT/ML DIALYSIS: 1000.0000 [IU] | INTRAMUSCULAR | Status: DC | PRN

## 2024-05-19 MED ORDER — LIDOCAINE-PRILOCAINE 2.5-2.5 % EX CREA: 1.0000 | TOPICAL_CREAM | CUTANEOUS | Status: DC | PRN

## 2024-05-19 MED ORDER — ANTICOAGULANT SODIUM CITRATE 4% (200MG/5ML) IV SOLN: 5.0000 mL | Status: DC | PRN

## 2024-05-19 MED ORDER — WARFARIN SODIUM 10 MG PO TABS
10.0000 mg | ORAL_TABLET | Freq: Once | ORAL | Status: AC
  Administered 2024-05-19: 10 mg via ORAL
  Filled 2024-05-19: qty 1

## 2024-05-19 MED ORDER — ALTEPLASE 2 MG IJ SOLR: 2.0000 mg | Freq: Once | INTRAMUSCULAR | Status: DC | PRN

## 2024-05-19 MED ORDER — PROMETHAZINE-DM 6.25-15 MG/5ML PO SYRP: 5.0000 mL | ORAL_SOLUTION | Freq: Two times a day (BID) | ORAL | Status: DC | PRN

## 2024-05-19 MED ORDER — HYDROCODONE-ACETAMINOPHEN 5-325 MG PO TABS: 1.0000 | ORAL_TABLET | ORAL | Status: DC | PRN

## 2024-05-19 MED ORDER — HEPARIN SODIUM (PORCINE) 1000 UNIT/ML DIALYSIS: 5000.0000 [IU] | INTRAMUSCULAR | Status: DC | PRN

## 2024-05-19 MED ORDER — ACETAMINOPHEN 650 MG RE SUPP: 650.0000 mg | Freq: Four times a day (QID) | RECTAL | Status: DC | PRN

## 2024-05-19 MED ORDER — MIDODRINE HCL 5 MG PO TABS
10.0000 mg | ORAL_TABLET | Freq: Three times a day (TID) | ORAL | Status: DC
  Administered 2024-05-19 – 2024-05-20 (×4): 10 mg via ORAL
  Filled 2024-05-19 (×4): qty 2

## 2024-05-19 MED ORDER — WARFARIN - PHARMACIST DOSING INPATIENT
Freq: Every day | Status: DC
  Filled 2024-05-19: qty 1

## 2024-05-19 MED ORDER — SODIUM CHLORIDE 0.9 % IV SOLN
500.0000 mg | Freq: Once | INTRAVENOUS | Status: AC
  Administered 2024-05-19: 500 mg via INTRAVENOUS
  Filled 2024-05-19: qty 5

## 2024-05-19 MED ORDER — INSULIN ASPART 100 UNIT/ML IJ SOLN: 0.0000 [IU] | Freq: Three times a day (TID) | INTRAMUSCULAR | Status: DC

## 2024-05-19 MED ORDER — LIDOCAINE HCL (PF) 1 % IJ SOLN: 5.0000 mL | INTRAMUSCULAR | Status: DC | PRN

## 2024-05-19 MED ORDER — ALBUTEROL SULFATE (2.5 MG/3ML) 0.083% IN NEBU: 2.5000 mg | INHALATION_SOLUTION | Freq: Four times a day (QID) | RESPIRATORY_TRACT | Status: DC | PRN

## 2024-05-19 NOTE — ED Provider Notes (Signed)
" °  Physical Exam  BP 125/84 (BP Location: Right Arm)   Pulse 94   Temp 98 F (36.7 C) (Oral)   Resp 19   Ht 6' 1 (1.854 m)   Wt 104.3 kg   SpO2 97%   BMI 30.34 kg/m   Physical Exam  Procedures  Procedures  ED Course / MDM   Clinical Course as of 05/19/24 0033  Sat May 19, 2024  0032 DG Chest 2 View [JL]    Clinical Course User Index [JL] Jerrol Agent, MD   Medical Decision Making Amount and/or Complexity of Data Reviewed Labs: ordered. Radiology: ordered.  Risk Decision regarding hospitalization.   89M with hx of CHF and ESRD, underwent dialysis today, still with evidence of volume overload.  Chest x-ray with evidence of pulmonary edema, not currently on oxygen  saturating well on room air however with evidence of pulmonary edema will need likely admission for observation and further dialysis tomorrow.  X-ray reviewed showed pulmonary edema as well as a space consolidation in the left base.  Patient was started on broad-spectrum antibiotics including Rocephin  and azithromycin  given his complaint of cough and shortness of breath over the past 2 weeks.  With evidence of pulmonary edema as well, patient will need admission for consideration for additional dialysis inpatient.  And is currently hemodynamically stable, afebrile, not meeting SIRS criteria, no leukocytosis on laboratory evaluation and saturating 97% on room air.  Hospitalist medicine was consulted previously for admission for observation, spoke with Dr. Cleatus who accepted the patient in admission.       Jerrol Agent, MD 05/19/24 [phone]  "

## 2024-05-19 NOTE — Assessment & Plan Note (Addendum)
 Heart rate controlled on admission. -- Continue warfarin Per pharmacy --add on INR --Resume digoxin  pending med history verification --Check digoxin  level

## 2024-05-19 NOTE — ED Notes (Signed)
 PT refused IV access. States he can't tolerate IV anywhere other than his hand. Hand was attempted unsuccessfully. PT educated on plan of care and need for IV antibiotics. PT verbalized complete understanding and denies questions.

## 2024-05-19 NOTE — Care Management Obs Status (Signed)
 MEDICARE OBSERVATION STATUS NOTIFICATION   Patient Details  Name: [name] MRN: [id number] Date of Birth: 27-Jun-1960   Medicare Observation Status Notification Given:  Yes    Robynn Eileen Hoose, RN 05/19/2024, 2:25 PM

## 2024-05-19 NOTE — ED Notes (Signed)
 Called Monisha at [facility] for transport 12:22-TC

## 2024-05-19 NOTE — Assessment & Plan Note (Signed)
 Pain control as needed. Resumed home oxycodone  (confirm when med history complete)

## 2024-05-19 NOTE — Assessment & Plan Note (Signed)
 With increased pulmonary edema on chest x-ray, associated orthopnea and cough.  Anticipate he may need extra dialysis. --Nephrology consulted --Please message Dr. Melia upon patient's arrival

## 2024-05-19 NOTE — ED Notes (Signed)
 Pt is having virtual assessment by hospitalist Dr. Fausto

## 2024-05-19 NOTE — Consult Note (Signed)
 [facility] Renal Consultation Note    Indication for Consultation:  Management of ESRD/hemodialysis; anemia, hypertension/volume and secondary hyperparathyroidism   HPI: [name] is a 63 y.o. male with ESRD on HD, DM, CHF, pAFib who is admitted with shortness of breath after dialysis. CXR notable for vascular congestion. He did receive full dialysis on Friday. He was pretty close to his dry weight so only had minimal fluid removal. Has had minimal UF with HD for past several treatments. His dry weight was lowered earlier this week. He reports cough and poor appetite for past few weeks. Flu/Covid negative.   Seen and examined. On room air. Tachypneic. Endorses shortness of breath, orthopnea.  Dialysis MWF at [facility]. Last dialysis Friday. Left 0.5kg under dry weight.   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 Claudene follows   Diabetes mellitus    pt. states he's borderline diabetic., no longer taking med,- off med. since 2013   Dialysis patient    Mon-Wed-Fri([facility])- Left AV fistula   Diverticulitis 03/2015   reoccurred in December 2016   Diverticulitis of intestine without perforation or abscess without bleeding 04/21/2015   Dizziness 11/22/2018   DVT (deep venous thrombosis) (HCC)    ESRD (end stage renal disease) (HCC)    GERD (gastroesophageal reflux disease)    Gout    Hemorrhage of left kidney 01/13/2018   History of nephrectomy 07/04/2012   History of right  nephrectomy in 2002 for renal cell carcinoma    History of unilateral nephrectomy    Hypertension    Hypotension due to hypovolemia 06/04/2020   Low iron     Myocardial infarction [facility]) ?2006   Renal cell carcinoma    dialysis- MWF- [address]- Dr. Froylan follows.   Renal insufficiency    Shortness of breath 05/19/2011   at rest, lying down, w/exertion   Stroke [facility]) 02/2011   05/19/11 denies residual   Umbilical hernia 05/19/2011   unrepaired   Wears glasses    Past Surgical History:  Procedure Laterality Date   A/V FISTULAGRAM N/A 06/30/2023   Procedure: A/V Fistulagram;  Surgeon: Melia Lynwood ORN, MD;  Location: [facility] INVASIVE CV LAB;  Service: Cardiovascular;  Laterality: N/A;   ANAL FISTULOTOMY  08/15/2018   AV FISTULA PLACEMENT  03/29/2011   Procedure: ARTERIOVENOUS (AV) FISTULA CREATION;  Surgeon: Redell Lurena Door, MD;  Location: [facility] OR;  Service: Vascular;  Laterality: Left;  LEFT RADIOCEPHALIC , Arteriovenous [phone])   CARDIAC CATHETERIZATION  05/21/11   CARDIAC CATHETERIZATION N/A 03/16/2016   Procedure: Left Heart Cath and Coronary Angiography;  Surgeon: Salena Claudene, MD;  Location: [facility] INVASIVE CV LAB;  Service: Cardiovascular;  Laterality: N/A;   CHOLECYSTECTOMY N/A 02/12/2016   Procedure: LAPAROSCOPIC CHOLECYSTECTOMY WITH INTRAOPERATIVE CHOLANGIOGRAM;  Surgeon: Krystal Russell, MD;  Location: [facility] OR;  Service: General;  Laterality: N/A;   COLONOSCOPY WITH PROPOFOL  N/A 02/01/2017   Procedure: COLONOSCOPY WITH PROPOFOL ;  Surgeon: Kristie Lamprey, MD;  Location: [facility] ENDOSCOPY;  Service: Endoscopy;  Laterality: N/A;   dialysis cath placed     ESOPHAGOGASTRODUODENOSCOPY (EGD) WITH PROPOFOL  N/A 12/02/2015   Procedure: ESOPHAGOGASTRODUODENOSCOPY (EGD) WITH PROPOFOL ;  Surgeon: Renaye Sous, MD;  Location: [facility] ENDOSCOPY;  Service: Endoscopy;  Laterality: N/A;   EVALUATION UNDER ANESTHESIA WITH HEMORRHOIDECTOMY N/A 08/15/2018   Procedure: HEMORRHOID LIGATION/PEXY;  Surgeon: Sheldon Standing, MD;  Location: [facility] OR;  Service: General;  Laterality: N/A;   FINGER SURGERY     L pinkie finger- ORIF- /w remaining hardware - 1990's     FISTULOTOMY N/A 08/15/2018   Procedure: SUPERFICIAL ANAL FISTULOTOMY;  Surgeon: Sheldon Standing, MD;  Location: [facility] OR;  Service: General;  Laterality: N/A;   HEMORRHOID SURGERY     HERNIA REPAIR N/A 07/04/2012   Umbilical hernia repair   INSERTION OF DIALYSIS CATHETER N/A 01/27/2016   Procedure: INSERTION OF DIALYSIS CATHETER;  Surgeon: Penne Lonni Colorado, MD;  Location: [facility] OR;  Service: Vascular;  Laterality: N/A;   INSERTION OF MESH N/A 07/04/2012   Procedure: INSERTION OF MESH;  Surgeon: Redell Faith, DO;  Location: [facility] OR;  Service: General;  Laterality: N/A;   IR EMBO ART  VEN HEMORR LYMPH EXTRAV  INC GUIDE ROADMAPPING  01/13/2018   IR FLUORO GUIDE CV LINE RIGHT  01/13/2018   IR IVC FILTER PLMT / S&I /IMG GUID/MOD SED  01/27/2018   IR RENAL SELECTIVE  UNI INC S&I MOD SED  01/13/2018   IR US  GUIDE VASC ACCESS RIGHT  01/13/2018   KIDNEY CYST REMOVAL  2019   LAPAROSCOPIC LYSIS OF ADHESIONS N/A 02/12/2016   Procedure: LAPAROSCOPIC LYSIS OF ADHESIONS;  Surgeon: Krystal Russell, MD;  Location: [facility] OR;  Service: General;  Laterality: N/A;   LEFT AND RIGHT HEART CATHETERIZATION WITH CORONARY ANGIOGRAM N/A 08/04/2012   Procedure: LEFT AND RIGHT HEART CATHETERIZATION WITH CORONARY ANGIOGRAM;  Surgeon: Salena GORMAN Negri, MD;  Location: [facility] CATH LAB;  Service: Cardiovascular;  Laterality: N/A;   NEPHRECTOMY  2000   right   PERIPHERAL VASCULAR BALLOON ANGIOPLASTY Left 06/30/2023   Procedure: PERIPHERAL VASCULAR BALLOON ANGIOPLASTY;  Surgeon: Melia Lynwood ORN, MD;  Location: [facility] INVASIVE CV LAB;  Service: Cardiovascular;  Laterality: Left;  60% cephalic vein   REVERSE SHOULDER ARTHROPLASTY Right 09/20/2022   Procedure: REVERSE SHOULDER ARTHROPLASTY;  Surgeon: Sharl Selinda Dover, MD;  Location: [facility] OR;  Service: Orthopedics;  Laterality: Right;  120   REVERSE SHOULDER  ARTHROPLASTY Left 03/18/2023   Procedure: REVERSE SHOULDER ARTHROPLASTY;  Surgeon: Sharl Selinda Dover, MD;  Location: [facility] OR;  Service: Orthopedics;  Laterality: Left;  120   REVISON OF ARTERIOVENOUS FISTULA Left 06/05/2013   Procedure: REVISON OF LEFT RADIOCEPHALIC  ARTERIOVENOUS FISTULA;  Surgeon: Redell LITTIE Door, MD;  Location: [facility] OR;  Service: Vascular;  Laterality: Left;   REVISON OF ARTERIOVENOUS FISTULA Left 01/27/2016   Procedure: REVISION OF LEFT UPPER EXTREMITY ARTERIOVENOUS FISTULA;  Surgeon: Penne Lonni Colorado, MD;  Location: [facility] OR;  Service: Vascular;  Laterality: Left;   REVISON OF ARTERIOVENOUS FISTULA Left 08/03/2016   Procedure: REVISON OF Left arm ARTERIOVENOUS FISTULA;  Surgeon: Carlin FORBES Haddock, MD;  Location: [facility] OR;  Service: Vascular;  Laterality: Left;   REVISON OF ARTERIOVENOUS FISTULA Left 05/06/2017   Procedure: REVISION PLICATION OF ARTERIOVENOUS FISTULA LEFT ARM;  Surgeon: Oris Krystal FALCON, MD;  Location: [facility] OR;  Service: Vascular;  Laterality: Left;   REVISON OF ARTERIOVENOUS FISTULA Left 02/01/2019   Procedure: REVISION OF ARTERIOVENOUS FISTULA LEFT ARM;  Surgeon: Gretta Lonni PARAS, MD;  Location: [facility] OR;  Service: Vascular;  Laterality: Left;   REVISON OF ARTERIOVENOUS FISTULA Left 03/15/2019   Procedure: REVISION PLICATION OF ARTERIOVENOUS FISTULA LEFT ARM;  Surgeon: Gretta Lonni PARAS, MD;  Location: [facility] OR;  Service: Vascular;  Laterality: Left;   RIGHT HEART CATHETERIZATION N/A 05/21/2011   Procedure: RIGHT HEART CATH;  Surgeon: Salena GORMAN Negri, MD;  Location: [facility] CATH LAB;  Service: Cardiovascular;  Laterality: N/A;   smashed  1990's   left pinky; have a plate in there   UMBILICAL HERNIA REPAIR N/A 07/04/2012   Procedure: LAPAROSCOPIC UMBILICAL HERNIA;  Surgeon: Redell Faith, DO;  Location: [facility] OR;  Service: General;  Laterality: N/A;   US  ECHOCARDIOGRAPHY  05/20/11   Family History  Problem Relation Age of Onset   Hypertension Mother    Kidney disease  Mother    Social History:  reports that he has never smoked. He has never used smokeless tobacco. He reports that he does not currently use drugs. He reports that he does not drink alcohol. Allergies[1] Prior to Admission medications  Medication Sig Start Date End Date Taking? Authorizing Provider  oxyCODONE  (OXY IR/ROXICODONE ) 5 MG immediate release tablet Take 5 mg by mouth every 8 (eight) hours as needed for severe pain (pain score 7-10). 11/03/23  Yes [provider]  acetaminophen  (TYLENOL ) 500 MG tablet Take 500 mg by mouth as needed.    [provider]  albuterol  (VENTOLIN  HFA) 108 (90 Base) MCG/ACT inhaler Inhale 2 puffs into the lungs every 6 (six) hours as needed for wheezing or shortness of breath. 04/12/24   Georgina Speaks, FNP  atorvastatin  (LIPITOR) 40 MG tablet Take 1 tablet (40 mg total) by mouth daily. 10/14/23   Patsy Lenis, MD  calcium  acetate (PHOSLO ) 667 MG capsule Take 1-3 capsules (667-2,001 mg total) by mouth See admin instructions. Take 2001 mg capsules with meals three times daily and 667 mg capsule with snacks. 11/23/18   Negri Salena, MD  clotrimazole  (LOTRIMIN ) 1 % cream APPLY TO AFFECTED AREA TWICE A DAY 03/07/24   Georgina Speaks, FNP  Control Gel Formula Dressing (DUODERM CGF EXTRA THIN) MISC Apply 1 each topically daily as needed. Patient not taking: Reported on 04/12/2024 10/26/20   Moore, Janece, FNP  digoxin  (LANOXIN ) 0.125 MG tablet Take 1 tablet (0.125 mg total) by mouth every Monday, Wednesday, and Friday. 06/09/20   Rai, Nydia POUR, MD  gabapentin  (NEURONTIN ) 300 MG capsule Take 1 capsule (300 mg total) by mouth 3 (three) times daily. 03/14/24   Georgina Speaks, FNP  glucose blood test strip Use to check blood sugar 3 times a day. 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 04/12/24   Georgina Speaks, FNP  Lancets [facility] DELICA  PLUS [place]) MISC Use to check blood sugar 3 times a day. Dx code e11.65 10/13/21   Moore, Janece, FNP  loperamide  (IMODIUM  A-D) 2 MG tablet Take 1 tablet (2 mg total) by mouth 4 (four) times daily as needed for diarrhea or loose stools. Also available OTC. 06/09/20   Rai, Nydia POUR, MD  midodrine  (PROAMATINE ) 10 MG tablet Take 1 tablet (10 mg total) by mouth 3 (three) times daily. 06/09/20   Rai, Ripudeep POUR, MD  pantoprazole  (PROTONIX ) 40 MG tablet Take 40 mg by mouth 2 (two) times daily.    [provider]  promethazine -dextromethorphan  (PROMETHAZINE -DM) 6.25-15 MG/5ML syrup Take 5 mLs by mouth 2 (two)  times daily as needed for cough. 04/29/24   Petrina Pries, NP  UNABLE TO FIND Out patient physical therapy- vestibular therapy for BPPV 08/01/18   Rizwan, Saima, MD  VELPHORO  500 MG chewable tablet Chew 1,500 mg by mouth 3 (three) times daily.    [provider]  warfarin (COUMADIN ) 7.5 MG tablet Take 10 mg by mouth daily.    [provider]   Current Facility-Administered Medications  Medication Dose Route Frequency Provider Last Rate Last Admin   acetaminophen  (TYLENOL ) tablet 650 mg  650 mg Oral Q6H PRN Fausto Sor A, DO       Or   acetaminophen  (TYLENOL ) suppository 650 mg  650 mg Rectal Q6H PRN Fausto Sor A, DO       albuterol  (PROVENTIL ) (2.5 MG/3ML) 0.083% nebulizer solution 2.5 mg  2.5 mg Nebulization Q6H PRN Fausto Sor A, DO       atorvastatin  (LIPITOR) tablet 40 mg  40 mg Oral Daily Fausto Sor A, DO   40 mg at 05/19/24 1400   [START ON 05/20/2024] azithromycin  (ZITHROMAX ) tablet 500 mg  500 mg Oral Daily Fausto Sor A, DO       [START ON 05/20/2024] cefTRIAXone  (ROCEPHIN ) 2 g in sodium chloride  0.9 % 100 mL IVPB  2 g Intravenous Q24H Fausto Sor A, DO       guaiFENesin  (MUCINEX ) 12 hr tablet 600 mg  600 mg Oral BID Fausto Sor A, DO   600 mg at 05/19/24 1400   guaiFENesin -dextromethorphan  (ROBITUSSIN DM) 100-10 MG/5ML syrup 5 mL  5  mL Oral BID PRN Fausto Sor A, DO       HYDROcodone -acetaminophen  (NORCO/VICODIN) 5-325 MG per tablet 1-2 tablet  1-2 tablet Oral Q4H PRN Fausto Sor A, DO       insulin  aspart (novoLOG ) injection 0-6 Units  0-6 Units Subcutaneous TID WC Fausto Sor A, DO       loperamide  (IMODIUM ) capsule 2 mg  2 mg Oral QID PRN Fausto Sor A, DO       midodrine  (PROAMATINE ) tablet 10 mg  10 mg Oral TID Fausto Sor A, DO   10 mg at 05/19/24 1400   oxyCODONE  (Oxy IR/ROXICODONE ) immediate release tablet 5 mg  5 mg Oral Q8H PRN Fausto Sor A, DO       pantoprazole  (PROTONIX ) EC tablet 40 mg  40 mg Oral BID Fausto Sor A, DO   40 mg at 05/19/24 1400   senna-docusate (Senokot-S) tablet 1 tablet  1 tablet Oral QHS PRN Fausto Sor A, DO       warfarin (COUMADIN ) tablet 10 mg  10 mg Oral Once Oriet, Jonathan, [facility]       Warfarin - Pharmacist Dosing Inpatient   Does not apply q1600 Gaines Carrier, RPH         ROS: As per HPI otherwise negative.  Physical Exam: Vitals:   05/19/24 0759 05/19/24 0800 05/19/24 1100 05/19/24 1200  BP: 114/68 114/71 130/83 135/82  Pulse: 95 92    Resp: 16 (!) 23 16 (!) 24  Temp: 97.9 F (36.6 C)   98.3 F (36.8 C)  TempSrc: Oral   Oral  SpO2: 98% 98%  95%  Weight:      Height:         General: Alert, nad, on room air  Head: NCAT sclera not icteric CV: RRR, no murmur Pulm: +rales at bases  Abdomen: non-tender, no masses  Lower extremities: no sig LE edema  Neuro: A & O X  3.  Psych:  Normal affect  Dialysis Access: L AVF +bruit   Labs: Basic Metabolic Panel: Recent Labs  Lab 05/18/24 1208  NA 138  K 3.5  CL 96*  CO2 30  GLUCOSE 66*  BUN 12  CREATININE 5.42*  CALCIUM  10.3   Liver Function Tests: No results for input(s): AST, ALT, ALKPHOS, BILITOT, PROT, ALBUMIN  in the last 168 hours. No results for input(s): LIPASE, AMYLASE in the last 168 hours. No results for input(s): AMMONIA in the last 168  hours. CBC: Recent Labs  Lab 05/18/24 1208  WBC 6.2  NEUTROABS 4.1  HGB 10.1*  HCT 31.1*  MCV 90.9  PLT 201   Cardiac Enzymes: No results for input(s): CKTOTAL, CKMB, CKMBINDEX, TROPONINI in the last 168 hours. CBG: Recent Labs  Lab 05/19/24 0628 05/19/24 0756 05/19/24 1351  GLUCAP 70 79 86   Iron  Studies: No results for input(s): IRON , TIBC, TRANSFERRIN, FERRITIN in the last 72 hours. Studies/Results: DG Chest Port 1 View Result Date: 05/19/2024 CLINICAL DATA:  Dyspnea. EXAM: PORTABLE CHEST 1 VIEW COMPARISON:  05/18/2024 FINDINGS: Low lung volumes. The cardio pericardial silhouette is enlarged. Vascular congestion and diffuse interstitial opacity suggests edema. Status post left shoulder replacement. Telemetry leads overlie the chest. IMPRESSION: Low volume film with vascular congestion and diffuse interstitial opacity suggesting edema. Electronically Signed   By: Camellia Candle M.D.   On: 05/19/2024 10:27   DG Chest 2 View Result Date: 05/18/2024 EXAM: 2 VIEW(S) XRAY OF THE CHEST 05/18/2024 12:35:00 PM COMPARISON: 03/18/2023 CLINICAL HISTORY: SOB FINDINGS: LUNGS AND PLEURA: Left base airspace consolidation. Trace bilateral pleural effusions. Increased interstitial markings with cephalization and possible developing Kerley B-lines, probably pulmonary edema. No pneumothorax. HEART AND MEDIASTINUM: Cardiomegaly. Atherosclerotic plaque. BONES AND SOFT TISSUES: Bilateral shoulder arthroplasty noted. No acute osseous abnormality. IMPRESSION: 1. Increased  pulmonary edema. 2. Left base airspace consolidation. 3. Trace bilateral pleural effusions. 4. Cardiomegaly. Electronically signed by: Katheleen Faes MD 05/18/2024 01:09 PM EST RP Workstation: [id number]    Dialysis Orders:  Unit: [facility] Schedule: MWF Time: 3:00  EDW: 2K/2.5Ca  Flows: 450/600 Bath: 2K/2.5CA Access: AVF Heparin : 5000  ESA: Mircera 75 q 2 wks  VDRA: Hectorol  7 q HD, Parsabiv 15 q  HD  Assessment/Plan: Dyspnea 2/2 volume overload. Pulmonary edema on CXR. ESRD patient losing body weight and needs EDW adjusted. Will have lower dry weight at discharge. Plan for HD tonight for fluid removal   ESRD.  HD MWF. Has not missed HD. Extra HD tonight  for volume  Hypertension. BP acceptable. Midodrine  10 mg with HD three times per week. Volume. As above. Optimized volume with HD Anemia. Hgb 10.1. No ESA needs.  Metabolic bone disease.  Ca elevated. No VDRA here. Follow trends Afib. On warfarin, digoxin . Per primary   Maisie Ronnald Acosta PA-C [facility] 05/19/2024, 2:48 PM           [1]  Allergies Allergen Reactions   Ace Inhibitors Itching, Cough and Other (See Comments)

## 2024-05-19 NOTE — ED Notes (Signed)
 Pt advises that he is anuric and will not be able to provide urine sample for  legionella pneumophila or strep pneumoniae tests ordered.  Ordering physician notified.

## 2024-05-19 NOTE — Assessment & Plan Note (Signed)
"  Resume PPI  "

## 2024-05-19 NOTE — Assessment & Plan Note (Signed)
 Acute on chronic -with increased pulmonary edema on chest x-ray, orthopnea as outlined above --Volume management by hemodialysis --Daily weights and strict I/O's

## 2024-05-19 NOTE — Assessment & Plan Note (Signed)
 Initial glucose on labs was 66, improved to 70 >> 79 this morning --Hypoglycemia protocol

## 2024-05-19 NOTE — Hospital Course (Signed)
 [name] is a 63 year old male with ESRD on HD, last dialysis session today with a complete session who presented with increased shortness of breath.  Has been having flulike symptoms for the past 2 weeks with cough and congestion and subjective fevers.  Vitals stable in the ED and not hypoxic.  BP controlled with systolic in the 120s.  Not meeting sepsis criteria.  Respiratory viral panel negative for COVID flu and RSV.  BNP > 35,000.  Chest x-ray showed possible basilar infiltrate as well as pulmonary edema  Started on antibiotics  Admission requested due to concern he might need another dialysis session   Going to: [facility]  Admission Status: obs  Bed Type:tele  To Do: consult nephrology, might need a repeat dialysis session

## 2024-05-19 NOTE — Assessment & Plan Note (Signed)
 Resume gabapentin  pending med history verification

## 2024-05-19 NOTE — Progress Notes (Signed)
 PHARMACY - ANTICOAGULATION CONSULT NOTE  Pharmacy Consult for Warfarin Indication: atrial fibrillation  Allergies[1]  Patient Measurements: Height: 6' 1 (185.4 cm) Weight: 104.3 kg (230 lb) IBW/kg (Calculated) : 79.9 HEPARIN  DW (KG): 101.2  Vital Signs: Temp: 97.9 F (36.6 C) (12/27 0759) Temp Source: Oral (12/27 0759) BP: 114/71 (12/27 0800) Pulse Rate: 92 (12/27 0800)  Labs: Recent Labs    05/18/24 1208 05/19/24 0905  HGB 10.1*  --   HCT 31.1*  --   PLT 201  --   LABPROT  --  18.3*  INR  --  1.4*  CREATININE 5.42*  --     Estimated Creatinine Clearance: 18.2 mL/min (A) (by C-G formula based on SCr of 5.42 mg/dL (H)).   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 Claudene follows   Diabetes mellitus    pt. states he's borderline diabetic., no longer taking med,- off med. since 2013   Dialysis patient    Mon-Wed-Fri([facility])- Left AV fistula   Diverticulitis 03/2015   reoccurred in December 2016   Diverticulitis of intestine without perforation or abscess without bleeding 04/21/2015   Dizziness 11/22/2018   DVT (deep venous thrombosis) (HCC)    ESRD (end stage renal disease) (HCC)    GERD (gastroesophageal reflux disease)    Gout    Hemorrhage of left kidney 01/13/2018   History of nephrectomy 07/04/2012   History of right nephrectomy in 2002 for renal cell carcinoma    History of unilateral nephrectomy    Hypertension    Hypotension due to hypovolemia  06/04/2020   Low iron     Myocardial infarction [facility]) ?2006   Renal cell carcinoma    dialysis- MWF- [address]- Dr. Froylan follows.   Renal insufficiency    Shortness of breath 05/19/2011   at rest, lying down, w/exertion   Stroke [facility]) 02/2011   05/19/11 denies residual   Umbilical hernia 05/19/2011   unrepaired   Wears glasses      Assessment: 63 YOM presenting with SOB, hx of afib on warfarin PTA with last dose taken 12/25 per pt report, INR subtherapeutic on admission at 1.4  Goal of Therapy:  INR 2-3 Monitor platelets by anticoagulation protocol: Yes   Plan:  Warfarin 10mg  PO x 1 today Daily INR, monitor s/s bleeding  Dorn Poot, PharmD, [facility] Clinical Pharmacist ED Pharmacist Phone # [phone] 05/19/2024 10:50 AM       [1]  Allergies Allergen Reactions   Ace Inhibitors Itching, Cough and Other (See Comments)

## 2024-05-19 NOTE — Assessment & Plan Note (Signed)
 Body mass index is 30.34 kg/m. Complicates overall care and prognosis.  Recommend lifestyle modifications including physical activity and diet for weight loss and overall long-term health.

## 2024-05-19 NOTE — Assessment & Plan Note (Signed)
 Resume midodrine  10 mg 3 times daily (confirm once med history complete)

## 2024-05-19 NOTE — Assessment & Plan Note (Signed)
 Resume statin and warfarin pharmacy

## 2024-05-19 NOTE — H&P (Addendum)
 "  Telemedicine History and Physical    Patient: [name] FMW:[id number] DOB: 06-12-1960 DOA: 05/18/2024 DOS: the patient was seen and examined on 05/19/2024 PCP: Georgina Speaks, FNP   Referring Provider: Tinnie Matter, PA/ Dr Lynwood Fruits  Telemedicine Provider: Dr. Burnard Cunning, DO Provider Location: [facility] [place] Patient Location: [facility] Referring Diagnosis: shortness of breath / pulmonary edema / PNA Patient Name and DOB verified: [name], Nov 12, 1960 Patient consented to Telemedicine Evaluation: Yes RN virtual assistant: Leeroy Paci, RN Video encounter time and date: 05/19/2024 8:11 AM   Patient coming from: Home  Chief Complaint:  Chief Complaint  Patient presents with   Cough   HPI: [name] is a 63 y.o. male with medical history significant of ESRD on HD, has completed full routine dialysis sessions who presented [facility] for evaluation of increasing shortness of breath.  Patient reports flulike illness with cough, congestion, subjective fevers for the past 2-3 weeks.  He reports coughing so hard it causes vomiting of clear mucus.  Also with associated poor appetite, increased shortness of breath with inability to lay flat in bed, having to sit up to catch his breath.  Prior to onset of his cough, he reports having a viral illness with nausea/vomiting/diarrhea (now resolved) in addition to cough and congestion.  No sore throat.  He denies any short or missed HD sessions.  Reports he's been below his dry weight at outpatient dialysis for last 5-6 sessions, so they've only done cleaning, not pulling fluid.    ED course: Initial vitals --temp 98.3 F, HR 87, RR 20, BP 107/68, SpO2 98% on room air. Labs obtained including BMP and CBC were notable for chloride 96, glucose 66 (repeat CBGs 70, 79 this morning), creatinine 5.42, hemoglobin 10.1. Viral PCR's negative for COVID, flu A/B and RSV. proBNP greater than 35,000 Imaging - --two-view chest x-ray  showed increased pulmonary edema, left base airspace consolidation, trace bilateral pleural effusions and cardiomegaly.  Pt was treated in the ED with IV Rocephin  and Zithromax  for empiric coverage of community-acquired pneumonia.  Pt being admitted to [facility] for further evaluation and management as outlined in detail below. Nephrology consulted for hemodialysis.    Review of Systems: As mentioned in the history of present illness. All other systems reviewed and are negative.   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 Claudene follows   Diabetes mellitus    pt. states he's borderline diabetic., no longer taking med,- off med. since 2013   Dialysis patient    Mon-Wed-Fri([facility])- Left AV fistula   Diverticulitis 03/2015   reoccurred in December 2016   Diverticulitis of intestine without perforation or abscess without bleeding 04/21/2015   Dizziness 11/22/2018   DVT (deep venous thrombosis) (HCC)    ESRD (end stage renal disease) (HCC)    GERD (gastroesophageal reflux disease)    Gout    Hemorrhage of left kidney 01/13/2018   History of nephrectomy 07/04/2012  History of right nephrectomy in 2002 for renal cell carcinoma    History of unilateral nephrectomy    Hypertension    Hypotension due to hypovolemia 06/04/2020   Low iron     Myocardial infarction [facility]) ?2006   Renal cell carcinoma    dialysis- [facility]- [address]- Dr. Froylan follows.   Renal insufficiency    Shortness of  breath 05/19/2011   at rest, lying down, w/exertion   Stroke [facility]) 02/2011   05/19/11 denies residual   Umbilical hernia 05/19/2011   unrepaired   Wears glasses    Past Surgical History:  Procedure Laterality Date   A/V FISTULAGRAM N/A 06/30/2023   Procedure: A/V Fistulagram;  Surgeon: Melia Lynwood ORN, MD;  Location: [facility] INVASIVE CV LAB;  Service: Cardiovascular;  Laterality: N/A;   ANAL FISTULOTOMY  08/15/2018   AV FISTULA PLACEMENT  03/29/2011   Procedure: ARTERIOVENOUS (AV) FISTULA CREATION;  Surgeon: Redell Lurena Door, MD;  Location: [facility] OR;  Service: Vascular;  Laterality: Left;  LEFT RADIOCEPHALIC , Arteriovenous [phone])   CARDIAC CATHETERIZATION  05/21/11   CARDIAC CATHETERIZATION N/A 03/16/2016   Procedure: Left Heart Cath and Coronary Angiography;  Surgeon: Salena Negri, MD;  Location: [facility] INVASIVE CV LAB;  Service: Cardiovascular;  Laterality: N/A;   CHOLECYSTECTOMY N/A 02/12/2016   Procedure: LAPAROSCOPIC CHOLECYSTECTOMY WITH INTRAOPERATIVE CHOLANGIOGRAM;  Surgeon: Krystal Russell, MD;  Location: [facility] OR;  Service: General;  Laterality: N/A;   COLONOSCOPY WITH PROPOFOL  N/A 02/01/2017   Procedure: COLONOSCOPY WITH PROPOFOL ;  Surgeon: Kristie Lamprey, MD;  Location: [facility] ENDOSCOPY;  Service: Endoscopy;  Laterality: N/A;   dialysis cath placed     ESOPHAGOGASTRODUODENOSCOPY (EGD) WITH PROPOFOL  N/A 12/02/2015   Procedure: ESOPHAGOGASTRODUODENOSCOPY (EGD) WITH PROPOFOL ;  Surgeon: Lamprey Kristie, MD;  Location: [facility] ENDOSCOPY;  Service: Endoscopy;  Laterality: N/A;   EVALUATION UNDER ANESTHESIA WITH HEMORRHOIDECTOMY N/A 08/15/2018   Procedure: HEMORRHOID LIGATION/PEXY;  Surgeon: Sheldon Standing, MD;  Location: [facility] OR;  Service: General;  Laterality: N/A;   FINGER SURGERY     L pinkie finger- ORIF- /w remaining hardware - 1990's     FISTULOTOMY N/A 08/15/2018   Procedure: SUPERFICIAL ANAL FISTULOTOMY;  Surgeon: Sheldon Standing, MD;  Location: [facility] OR;  Service: General;  Laterality: N/A;   HEMORRHOID  SURGERY     HERNIA REPAIR N/A 07/04/2012   Umbilical hernia repair   INSERTION OF DIALYSIS CATHETER N/A 01/27/2016   Procedure: INSERTION OF DIALYSIS CATHETER;  Surgeon: Penne Lonni Colorado, MD;  Location: [facility] OR;  Service: Vascular;  Laterality: N/A;   INSERTION OF MESH N/A 07/04/2012   Procedure: INSERTION OF MESH;  Surgeon: Redell Faith, DO;  Location: [facility] OR;  Service: General;  Laterality: N/A;   IR EMBO ART  VEN HEMORR LYMPH EXTRAV  INC GUIDE ROADMAPPING  01/13/2018   IR FLUORO GUIDE CV LINE RIGHT  01/13/2018   IR IVC FILTER PLMT / S&I /IMG GUID/MOD SED  01/27/2018   IR RENAL SELECTIVE  UNI INC S&I MOD SED  01/13/2018   IR US  GUIDE VASC ACCESS RIGHT  01/13/2018   KIDNEY CYST REMOVAL  2019   LAPAROSCOPIC LYSIS OF ADHESIONS N/A 02/12/2016   Procedure: LAPAROSCOPIC LYSIS OF ADHESIONS;  Surgeon: Krystal Russell, MD;  Location: [facility] OR;  Service: General;  Laterality: N/A;   LEFT AND RIGHT HEART CATHETERIZATION WITH CORONARY ANGIOGRAM N/A 08/04/2012   Procedure: LEFT AND RIGHT HEART CATHETERIZATION WITH CORONARY ANGIOGRAM;  Surgeon: Salena GORMAN Negri, MD;  Location: [facility] CATH LAB;  Service: Cardiovascular;  Laterality: N/A;  NEPHRECTOMY  2000   right   PERIPHERAL VASCULAR BALLOON ANGIOPLASTY Left 06/30/2023   Procedure: PERIPHERAL VASCULAR BALLOON ANGIOPLASTY;  Surgeon: Melia Lynwood ORN, MD;  Location: [facility] INVASIVE CV LAB;  Service: Cardiovascular;  Laterality: Left;  60% cephalic vein   REVERSE SHOULDER ARTHROPLASTY Right 09/20/2022   Procedure: REVERSE SHOULDER ARTHROPLASTY;  Surgeon: Sharl Selinda Dover, MD;  Location: [facility] OR;  Service: Orthopedics;  Laterality: Right;  120   REVERSE SHOULDER ARTHROPLASTY Left 03/18/2023   Procedure: REVERSE SHOULDER ARTHROPLASTY;  Surgeon: Sharl Selinda Dover, MD;  Location: [facility] OR;  Service: Orthopedics;  Laterality: Left;  120   REVISON OF ARTERIOVENOUS FISTULA Left 06/05/2013   Procedure: REVISON OF LEFT RADIOCEPHALIC  ARTERIOVENOUS FISTULA;  Surgeon: Redell LITTIE Door, MD;   Location: [facility] OR;  Service: Vascular;  Laterality: Left;   REVISON OF ARTERIOVENOUS FISTULA Left 01/27/2016   Procedure: REVISION OF LEFT UPPER EXTREMITY ARTERIOVENOUS FISTULA;  Surgeon: Penne Lonni Colorado, MD;  Location: [facility] OR;  Service: Vascular;  Laterality: Left;   REVISON OF ARTERIOVENOUS FISTULA Left 08/03/2016   Procedure: REVISON OF Left arm ARTERIOVENOUS FISTULA;  Surgeon: Carlin FORBES Haddock, MD;  Location: [facility] OR;  Service: Vascular;  Laterality: Left;   REVISON OF ARTERIOVENOUS FISTULA Left 05/06/2017   Procedure: REVISION PLICATION OF ARTERIOVENOUS FISTULA LEFT ARM;  Surgeon: Oris Krystal FALCON, MD;  Location: [facility] OR;  Service: Vascular;  Laterality: Left;   REVISON OF ARTERIOVENOUS FISTULA Left 02/01/2019   Procedure: REVISION OF ARTERIOVENOUS FISTULA LEFT ARM;  Surgeon: Gretta Lonni PARAS, MD;  Location: [facility] OR;  Service: Vascular;  Laterality: Left;   REVISON OF ARTERIOVENOUS FISTULA Left 03/15/2019   Procedure: REVISION PLICATION OF ARTERIOVENOUS FISTULA LEFT ARM;  Surgeon: Gretta Lonni PARAS, MD;  Location: [facility] OR;  Service: Vascular;  Laterality: Left;   RIGHT HEART CATHETERIZATION N/A 05/21/2011   Procedure: RIGHT HEART CATH;  Surgeon: Salena GORMAN Negri, MD;  Location: [facility] CATH LAB;  Service: Cardiovascular;  Laterality: N/A;   smashed  1990's   left pinky; have a plate in there   UMBILICAL HERNIA REPAIR N/A 07/04/2012   Procedure: LAPAROSCOPIC UMBILICAL HERNIA;  Surgeon: Redell Faith, DO;  Location: [facility] OR;  Service: General;  Laterality: N/A;   US  ECHOCARDIOGRAPHY  05/20/11   Social History:  reports that he has never smoked. He has never used smokeless tobacco. He reports that he does not currently use drugs. He reports that he does not drink alcohol.  Allergies[1]  Family History  Problem Relation Age of Onset   Hypertension Mother    Kidney disease Mother     Prior to Admission medications  Medication Sig Start Date End Date Taking? Authorizing Provider  oxyCODONE  (OXY  IR/ROXICODONE ) 5 MG immediate release tablet Take 5 mg by mouth every 8 (eight) hours as needed for severe pain (pain score 7-10). 11/03/23  Yes [provider]  acetaminophen  (TYLENOL ) 500 MG tablet Take 500 mg by mouth as needed.    [provider]  albuterol  (VENTOLIN  HFA) 108 (90 Base) MCG/ACT inhaler Inhale 2 puffs into the lungs every 6 (six) hours as needed for wheezing or shortness of breath. 04/12/24   Georgina Speaks, FNP  atorvastatin  (LIPITOR) 40 MG tablet Take 1 tablet (40 mg total) by mouth daily. 10/14/23   Patsy Lenis, MD  calcium  acetate (PHOSLO ) 667 MG capsule Take 1-3 capsules (667-2,001 mg total) by mouth See admin instructions. Take 2001 mg capsules with meals three times daily and 667 mg capsule with snacks. 11/23/18  Claudene Pacific, MD  clotrimazole  (LOTRIMIN ) 1 % cream APPLY TO AFFECTED AREA TWICE A DAY 03/07/24   Moore, Janece, FNP  Control Gel Formula Dressing (DUODERM CGF EXTRA THIN) MISC Apply 1 each topically daily as needed. Patient not taking: Reported on 04/12/2024 10/26/20   Moore, Janece, FNP  digoxin  (LANOXIN ) 0.125 MG tablet Take 1 tablet (0.125 mg total) by mouth every Monday, Wednesday, and Friday. 06/09/20   Rai, Nydia POUR, MD  gabapentin  (NEURONTIN ) 300 MG capsule Take 1 capsule (300 mg total) by mouth 3 (three) times daily. 03/14/24   Georgina Speaks, FNP  glucose blood test strip Use to check blood sugar 3 times a day. 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 04/12/24   Georgina Speaks, FNP  Lancets [facility] DELICA PLUS [place]) MISC Use to check blood sugar 3 times a day. Dx code e11.65 10/13/21   Moore, Janece, FNP  loperamide  (IMODIUM  A-D) 2 MG tablet Take 1 tablet (2 mg total) by mouth 4 (four) times daily as needed for diarrhea or loose stools. Also available OTC. 06/09/20   Rai, Nydia POUR, MD  midodrine  (PROAMATINE ) 10 MG  tablet Take 1 tablet (10 mg total) by mouth 3 (three) times daily. 06/09/20   Rai, Nydia POUR, MD  pantoprazole  (PROTONIX ) 40 MG tablet Take 40 mg by mouth 2 (two) times daily.    [provider]  promethazine -dextromethorphan  (PROMETHAZINE -DM) 6.25-15 MG/5ML syrup Take 5 mLs by mouth 2 (two) times daily as needed for cough. 04/29/24   Petrina Pries, NP  UNABLE TO FIND Out patient physical therapy- vestibular therapy for BPPV 08/01/18   Rizwan, Saima, MD  VELPHORO  500 MG chewable tablet Chew 1,500 mg by mouth 3 (three) times daily.    [provider]  warfarin (COUMADIN ) 7.5 MG tablet Take 10 mg by mouth daily.    [provider]    Physical Exam: Vitals:   05/19/24 0230 05/19/24 0621 05/19/24 0759 05/19/24 0800  BP: 127/77 120/76 114/68 114/71  Pulse: 94 99 95 92  Resp: (!) 24 (!) 22 16 (!) 23  Temp: 98 F (36.7 C) 98.4 F (36.9 C) 97.9 F (36.6 C)   TempSrc: Oral Oral Oral   SpO2: 98% 100% 98% 98%  Weight:      Height:       Bedside physical exam was performed by RN listed above. Below exam findings are based on their in person physical exam findings and my observations during virtual encounter.  General exam: awake, alert, no acute distress HEENT: voice clear, hearing grossly normal  Respiratory system: Diminished throughout worse in the left base, normal respiratory effort at rest, on room air, no accessory muscle use or conversational dyspnea noted. Cardiovascular system: normal S1/S2, RRR.   Gastrointestinal system: soft, NT, ND Central nervous system: A&O x 3. no gross focal neurologic deficits, normal speech Extremities: Left upper extremity fistula noted, R>L lower extremity edema patient reports his baseline Skin: dry, intact, normal temperature per RN Psychiatry: normal mood, congruent affect, judgement and insight appear normal    Data Reviewed:  As reviewed in detail above  Assessment and Plan:  * Acute pulmonary edema (HCC) Patient  presenting with orthopnea and cough, increasing pulmonary edema on chest x-ray.  No missed or shortened dialysis sessions.  Patient does report being below his dry weight for the past 5-6 outpatient dialysis session, so they  have not been following fluid. --Nephrology consulted for dialysis  -- Repeat chest x-ray in the morning --Last echo in May, will not repeat at this time --Follow volume status closely  Hypoglycemia Initial glucose on labs was 66, improved to 70 >> 79 this morning --Hypoglycemia protocol  Type 2 diabetes mellitus with end-stage renal disease (HCC) Initially hypoglycemic on BMP with glucose of 66, improved to the 70s this morning. --Hypoglycemia protocol --Very sensitive sliding scale NovoLog   Anemia in chronic kidney disease Hemoglobin stable 10.1. --Monitor CBC  Paroxysmal atrial fibrillation (HCC) Heart rate controlled on admission. -- Continue warfarin Per pharmacy --add on INR --Resume digoxin  pending med history verification --Check digoxin  level  End-stage renal disease on hemodialysis (HCC) With increased pulmonary edema on chest x-ray, associated orthopnea and cough.  Anticipate he may need extra dialysis. --Nephrology consulted --Please message Dr. Melia upon patient's arrival  Chronic combined systolic (congestive) and diastolic (congestive) heart failure (HCC) Acute on chronic -with increased pulmonary edema on chest x-ray, orthopnea as outlined above --Volume management by hemodialysis --Daily weights and strict I/O's  Neuropathy Resume gabapentin  pending med history verification  Obesity (BMI 30-39.9) Body mass index is 30.34 kg/m. Complicates overall care and prognosis.  Recommend lifestyle modifications including physical activity and diet for weight loss and overall long-term health.  History of CVA (cerebrovascular accident) Resume statin and warfarin pharmacy  Hyperlipidemia, unspecified Resume statin  GERD (gastroesophageal  reflux disease) Resume PPI  Chronic pain Pain control as needed. Resumed home oxycodone  (confirm when med history complete)  Hypotension of hemodialysis Resume midodrine  10 mg 3 times daily (confirm once med history complete)      Advance Care Planning: CODE STATUS-full code  Consults: Nephrology  Family Communication: None present during virtual admission encounter  Severity of Illness: The appropriate patient status for this patient is OBSERVATION. Observation status is judged to be reasonable and necessary in order to provide the required intensity of service to ensure the patient's safety. The patient's presenting symptoms, physical exam findings, and initial radiographic and laboratory data in the context of their medical condition is felt to place them at decreased risk for further clinical deterioration. Furthermore, it is anticipated that the patient will be medically stable for discharge from the hospital within 2 midnights of admission.   Author: Burnard DELENA Cunning, DO 05/19/2024 10:13 AM  For on call review [email or web address].      [1]  Allergies Allergen Reactions   Ace Inhibitors Itching, Cough and Other (See Comments)   "

## 2024-05-19 NOTE — Assessment & Plan Note (Signed)
 Patient presenting with orthopnea and cough, increasing pulmonary edema on chest x-ray.  No missed or shortened dialysis sessions.  Patient does report being below his dry weight for the past 5-6 outpatient dialysis session, so they have not been following fluid. --Nephrology consulted for dialysis  -- Repeat chest x-ray in the morning --Last echo in May, will not repeat at this time --Follow volume status closely

## 2024-05-19 NOTE — Assessment & Plan Note (Signed)
 Hemoglobin stable 10.1. --Monitor CBC

## 2024-05-19 NOTE — Assessment & Plan Note (Signed)
"   Resume statin.     "

## 2024-05-19 NOTE — Assessment & Plan Note (Signed)
 Initially hypoglycemic on BMP with glucose of 66, improved to the 70s this morning. --Hypoglycemia protocol --Very sensitive sliding scale NovoLog 

## 2024-05-20 LAB — CBC
HCT: 34 % — ABNORMAL LOW (ref 39.0–52.0)
Hemoglobin: 10.8 g/dL — ABNORMAL LOW (ref 13.0–17.0)
MCH: 29 pg (ref 26.0–34.0)
MCHC: 31.8 g/dL (ref 30.0–36.0)
MCV: 91.4 fL (ref 80.0–100.0)
Platelets: 238 K/uL (ref 150–400)
RBC: 3.72 MIL/uL — ABNORMAL LOW (ref 4.22–5.81)
RDW: 18.3 % — ABNORMAL HIGH (ref 11.5–15.5)
WBC: 6.5 K/uL (ref 4.0–10.5)
nRBC: 0 % (ref 0.0–0.2)

## 2024-05-20 LAB — PROTIME-INR
INR: 1.5 — ABNORMAL HIGH (ref 0.8–1.2)
Prothrombin Time: 18.9 s — ABNORMAL HIGH (ref 11.4–15.2)

## 2024-05-20 LAB — BASIC METABOLIC PANEL WITH GFR
Anion gap: 15 (ref 5–15)
BUN: 21 mg/dL (ref 8–23)
CO2: 23 mmol/L (ref 22–32)
Calcium: 10.2 mg/dL (ref 8.9–10.3)
Chloride: 96 mmol/L — ABNORMAL LOW (ref 98–111)
Creatinine, Ser: 7.19 mg/dL — ABNORMAL HIGH (ref 0.61–1.24)
GFR, Estimated: 8 mL/min — ABNORMAL LOW
Glucose, Bld: 80 mg/dL (ref 70–99)
Potassium: 3.7 mmol/L (ref 3.5–5.1)
Sodium: 135 mmol/L (ref 135–145)

## 2024-05-20 LAB — GLUCOSE, CAPILLARY
Glucose-Capillary: 75 mg/dL (ref 70–99)
Glucose-Capillary: 78 mg/dL (ref 70–99)
Glucose-Capillary: 86 mg/dL (ref 70–99)

## 2024-05-20 LAB — HEPATITIS B SURFACE ANTIBODY,QUALITATIVE: Hep B S Ab: REACTIVE — AB

## 2024-05-20 LAB — HEPATITIS B SURFACE ANTIGEN: Hepatitis B Surface Ag: NONREACTIVE

## 2024-05-20 MED ORDER — GABAPENTIN 300 MG PO CAPS: 300.0000 mg | ORAL_CAPSULE | Freq: Two times a day (BID) | ORAL | Status: AC

## 2024-05-20 MED ORDER — GABAPENTIN 300 MG PO CAPS
300.0000 mg | ORAL_CAPSULE | Freq: Two times a day (BID) | ORAL | Status: DC
  Administered 2024-05-20: 300 mg via ORAL
  Filled 2024-05-20: qty 1

## 2024-05-20 NOTE — Discharge Summary (Signed)
 " Physician Discharge Summary   Patient: [name] MRN: [id number] DOB: [birth year]  Admit date:     05/18/2024  Discharge date: 05/20/2024  Discharge Physician: Garnette Pelt   PCP: Georgina Speaks, FNP   Recommendations at discharge:    Follow up with PCP in 1-2 weeks Follow up with scheduled HD (Currently on holiday scheduled for Tues as next HD) Recommend close volume and wt reassessment. Updated EDW at time of dc: 101kg  Discharge Diagnoses: Principal Problem:   Acute pulmonary edema (HCC) Active Problems:   Hypoglycemia   Chronic combined systolic (congestive) and diastolic (congestive) heart failure (HCC)   End-stage renal disease on hemodialysis (HCC)   Paroxysmal atrial fibrillation (HCC)   Anemia in chronic kidney disease   Type 2 diabetes mellitus with end-stage renal disease (HCC)   Chronic pain   GERD (gastroesophageal reflux disease)   Hyperlipidemia, unspecified   History of CVA (cerebrovascular accident)   Obesity (BMI 30-39.9)   Neuropathy   Hypotension of hemodialysis   Pulmonary edema  Resolved Problems:   * No resolved hospital problems. *  Hospital Course: 63 y.o. male with medical history significant of ESRD on HD, has completed full routine dialysis sessions who presented to [facility] for evaluation of increasing shortness of breath.  Patient reports flulike illness with cough, congestion, subjective fevers for the past 2-3 weeks.  He reports coughing so hard it causes vomiting of clear mucus.  Also with associated poor appetite, increased shortness of breath with inability to lay flat in bed, having to sit up to catch his breath.  Prior to onset of his cough, he reports having a viral illness with nausea/vomiting/diarrhea (now resolved) in addition to cough and congestion.  No sore throat.  He denies any short or missed HD sessions.  Reports he's been below his dry weight at outpatient dialysis for last 5-6 sessions,   Assessment and Plan: Acute  pulmonary edema [facility]) Patient presenting with orthopnea and cough, increasing pulmonary edema on chest x-ray.   --Nephrology consulted for dialysis  --Clinically improved with 3L UF --Updated EDW per Nephrology to 101kg   Hypoglycemia Initial glucose on labs was 66, improved    Type 2 diabetes mellitus with end-stage renal disease (HCC) Initially hypoglycemic on BMP with glucose of 66, improved   Anemia in chronic kidney disease Hemoglobin stable 10.1. --Monitor CBC   Paroxysmal atrial fibrillation (HCC) Heart rate controlled on admission. -- Continue warfarin Per pharmacy --Resume digoxin      End-stage renal disease on hemodialysis (HCC) With increased pulmonary edema on chest x-ray, associated orthopnea and cough.  Anticipate he may need extra dialysis. --Nephrology consulted and pt improved clinically after HD --Per Nephrology, pt can resume HD on Tues per holiday scheduled   Chronic combined systolic (congestive) and diastolic (congestive) heart failure (HCC) Acute on chronic -with increased pulmonary edema on chest x-ray, orthopnea as outlined above --Volume management by hemodialysis   Neuropathy Resumed gabapentin  pending med history verification   Obesity (BMI 30-39.9) Body mass index is 30.34 kg/m. Complicates overall care and prognosis.  Recommend lifestyle modifications including physical activity and diet for weight loss and overall long-term health.   History of CVA (cerebrovascular accident) Resume statin and warfarin pharmacy   Hyperlipidemia, unspecified Resume statin   GERD (gastroesophageal reflux disease) Resume PPI   Chronic pain Pain control as needed. Resumed home oxycodone    Hypotension of hemodialysis Resume midodrine  10 mg 3 times daily     Consultants: Nephrology Procedures performed:  Disposition: Home Diet recommendation:  Renal diet DISCHARGE MEDICATION: Allergies as of 05/20/2024       Reactions   Ace Inhibitors Itching,  Cough, Other (See Comments)        Medication List     STOP taking these medications    UNABLE TO FIND       TAKE these medications    acetaminophen  500 MG tablet Commonly known as: TYLENOL  Take 500 mg by mouth as needed.   albuterol  108 (90 Base) MCG/ACT inhaler Commonly known as: VENTOLIN  HFA Inhale 2 puffs into the lungs every 6 (six) hours as needed for wheezing or shortness of breath.   atorvastatin  40 MG tablet Commonly known as: LIPITOR Take 1 tablet (40 mg total) by mouth daily.   calcium  acetate 667 MG capsule Commonly known as: PHOSLO  Take 1-3 capsules (667-2,001 mg total) by mouth See admin instructions. Take 2001 mg capsules with meals three times daily and 667 mg capsule with snacks.   clotrimazole  1 % cream Commonly known as: LOTRIMIN  APPLY TO AFFECTED AREA TWICE A DAY   digoxin  0.125 MG tablet Commonly known as: LANOXIN  Take 1 tablet (0.125 mg total) by mouth every Monday, Wednesday, and Friday.   DuoDERM CGF Extra Thin Misc Apply 1 each topically daily as needed.   gabapentin  300 MG capsule Commonly known as: NEURONTIN  Take 1 capsule (300 mg total) by mouth 2 (two) times daily. What changed: when to take this   glucose blood test strip Use to check blood sugar 3 times a day. Dx code e11.65   HYDROcodone  bit-homatropine 5-1.5 MG/5ML syrup Commonly known as: Hydromet Take 5 mLs by mouth every 6 (six) hours as needed for cough.   loperamide  2 MG tablet Commonly known as: IMODIUM  A-D Take 1 tablet (2 mg total) by mouth 4 (four) times daily as needed for diarrhea or loose stools. Also available OTC.   midodrine  10 MG tablet Commonly known as: PROAMATINE  Take 1 tablet (10 mg total) by mouth 3 (three) times daily.   OneTouch Delica Plus Lancet33G Misc Use to check blood sugar 3 times a day. Dx code e11.65   oxyCODONE  5 MG immediate release tablet Commonly known as: Oxy IR/ROXICODONE  Take 5 mg by mouth every 8 (eight) hours as needed for  severe pain (pain score 7-10).   pantoprazole  40 MG tablet Commonly known as: PROTONIX  Take 40 mg by mouth 2 (two) times daily.   promethazine -dextromethorphan  6.25-15 MG/5ML syrup Commonly known as: PROMETHAZINE -DM Take 5 mLs by mouth 2 (two) times daily as needed for cough.   Velphoro  500 MG chewable tablet Generic drug: sucroferric oxyhydroxide Chew 1,500 mg by mouth 3 (three) times daily.   warfarin 7.5 MG tablet Commonly known as: COUMADIN  Take 10 mg by mouth daily.        Discharge Exam: Filed Weights   05/19/24 2015 05/19/24 2330 05/20/24 0500  Weight: 104.3 kg 101.3 kg 100.1 kg   General exam: Awake, laying in bed, in nad Respiratory system: Normal respiratory effort, no wheezing Cardiovascular system: regular rate, s1, s2 Gastrointestinal system: Soft, nondistended, positive BS Central nervous system: CN2-12 grossly intact, strength intact Extremities: Perfused, no clubbing Skin: Normal skin turgor, no notable skin lesions seen Psychiatry: Mood normal // no visual hallucinations   Condition at discharge: fair  The results of significant diagnostics from this hospitalization (including imaging, microbiology, ancillary and laboratory) are listed below for reference.   Imaging Studies: DG Chest Port 1 View Result Date: 05/19/2024 CLINICAL DATA:  Dyspnea.  EXAM: PORTABLE CHEST 1 VIEW COMPARISON:  05/18/2024 FINDINGS: Low lung volumes. The cardio pericardial silhouette is enlarged. Vascular congestion and diffuse interstitial opacity suggests edema. Status post left shoulder replacement. Telemetry leads overlie the chest. IMPRESSION: Low volume film with vascular congestion and diffuse interstitial opacity suggesting edema. Electronically Signed   By: Camellia Candle M.D.   On: 05/19/2024 10:27   DG Chest 2 View Result Date: 05/18/2024 EXAM: 2 VIEW(S) XRAY OF THE CHEST 05/18/2024 12:35:00 PM COMPARISON: 03/18/2023 CLINICAL HISTORY: SOB FINDINGS: LUNGS AND PLEURA: Left  base airspace consolidation. Trace bilateral pleural effusions. Increased interstitial markings with cephalization and possible developing Kerley B-lines, probably pulmonary edema. No pneumothorax. HEART AND MEDIASTINUM: Cardiomegaly. Atherosclerotic plaque. BONES AND SOFT TISSUES: Bilateral shoulder arthroplasty noted. No acute osseous abnormality. IMPRESSION: 1. Increased  pulmonary edema. 2. Left base airspace consolidation. 3. Trace bilateral pleural effusions. 4. Cardiomegaly. Electronically signed by: Dayne Hassell MD 05/18/2024 01:09 PM EST RP Workstation: [id number]    Microbiology: Results for orders placed or performed during the hospital encounter of 05/18/24  Resp panel by RT-PCR (RSV, Flu A&B, Covid) Anterior Nasal Swab     Status: None   Collection Time: 05/18/24 12:06 PM   Specimen: Anterior Nasal Swab  Result Value Ref Range Status   SARS Coronavirus 2 by RT PCR NEGATIVE NEGATIVE Final    Comment: (NOTE) SARS-CoV-2 target nucleic acids are NOT DETECTED.  The SARS-CoV-2 RNA is generally detectable in upper respiratory specimens during the acute phase of infection. The lowest concentration of SARS-CoV-2 viral copies this assay can detect is 138 copies/mL. A negative result does not preclude SARS-Cov-2 infection and should not be used as the sole basis for treatment or other patient management decisions. A negative result may occur with  improper specimen collection/handling, submission of specimen other than nasopharyngeal swab, presence of viral mutation(s) within the areas targeted by this assay, and inadequate number of viral copies(<138 copies/mL). A negative result must be combined with clinical observations, patient history, and epidemiological information. The expected result is Negative.  Fact Sheet for Patients:  bloggercourse.com  Fact Sheet for Healthcare Providers:  seriousbroker.it  This test is no t yet  approved or cleared by the [place]  FDA and  has been authorized for detection and/or diagnosis of SARS-CoV-2 by FDA under an Emergency Use Authorization (EUA). This EUA will remain  in effect (meaning this test can be used) for the duration of the COVID-19 declaration under Section 564(b)(1) of the Act, 21 U.S.C.section 360bbb-3(b)(1), unless the authorization is terminated  or revoked sooner.       Influenza A by PCR NEGATIVE NEGATIVE Final   Influenza B by PCR NEGATIVE NEGATIVE Final    Comment: (NOTE) The Xpert Xpress SARS-CoV-2/FLU/RSV plus assay is intended as an aid in the diagnosis of influenza from Nasopharyngeal swab specimens and should not be used as a sole basis for treatment. Nasal washings and aspirates are unacceptable for Xpert Xpress SARS-CoV-2/FLU/RSV testing.  Fact Sheet for Patients: bloggercourse.com  Fact Sheet for Healthcare Providers: seriousbroker.it  This test is not yet approved or cleared by the [place]  FDA and has been authorized for detection and/or diagnosis of SARS-CoV-2 by FDA under an Emergency Use Authorization (EUA). This EUA will remain in effect (meaning this test can be used) for the duration of the COVID-19 declaration under Section 564(b)(1) of the Act, 21 U.S.C. section 360bbb-3(b)(1), unless the authorization is terminated or revoked.     Resp Syncytial Virus by PCR NEGATIVE NEGATIVE Final  Comment: (NOTE) Fact Sheet for Patients: bloggercourse.com  Fact Sheet for Healthcare Providers: seriousbroker.it  This test is not yet approved or cleared by the [place]  FDA and has been authorized for detection and/or diagnosis of SARS-CoV-2 by FDA under an Emergency Use Authorization (EUA). This EUA will remain in effect (meaning this test can be used) for the duration of the COVID-19 declaration under Section 564(b)(1) of  the Act, 21 U.S.C. section 360bbb-3(b)(1), unless the authorization is terminated or revoked.  Performed at [facility], [address] [place], [place] [address]     Labs: CBC: Recent Labs  Lab 05/18/24 1208 05/20/24 0711  WBC 6.2 6.5  NEUTROABS 4.1  --   HGB 10.1* 10.8*  HCT 31.1* 34.0*  MCV 90.9 91.4  PLT 201 238   Basic Metabolic Panel: Recent Labs  Lab 05/18/24 1208 05/20/24 0711  NA 138 135  K 3.5 3.7  CL 96* 96*  CO2 30 23  GLUCOSE 66* 80  BUN 12 21  CREATININE 5.42* 7.19*  CALCIUM  10.3 10.2   Liver Function Tests: No results for input(s): AST, ALT, ALKPHOS, BILITOT, PROT, ALBUMIN  in the last 168 hours. CBG: Recent Labs  Lab 05/19/24 1351 05/19/24 1803 05/20/24 0058 05/20/24 0800 05/20/24 1208  GLUCAP 86 70 78 75 86    Discharge time spent: less than 30 minutes.  Signed: Garnette Pelt, MD [facility] 05/20/2024 "

## 2024-05-20 NOTE — Plan of Care (Incomplete)
 Had a seemingly uneventful day. This patient is aware of necessity for continued observation and is aware of care plan and educational needs at this time.  +- HA +-N/V +-Lightheadedness/Dizziness +-GI s/s +-GU s/s +-N/T +-Falls/LOC +-Cough/Congestion   Patient remains alert and oriented to:   Provider listed on chart contacted on x occurrences during shift.

## 2024-05-20 NOTE — Progress Notes (Signed)
 Received patient in bed to unit.  Alert and oriented.  Informed consent signed and in chart.   TX duration: 3  Patient tolerated well.  Transported back to the room  Alert, without acute distress.  Hand-off given to patient's nurse. Shift RN  Access used: AVF Access issues: None  Total UF removed: 3000 Medication(s) given: None Post HD VS: T98.4-HR87-RR22 B/P117/70 Post HD weight: 101.3kg  Neville Seip, RN [facility]   05/19/24 2330  Vitals  Temp 98.4 F (36.9 C)  Temp Source Oral  BP 117/70  MAP (mmHg) 85  BP Location Right Arm  BP Method Automatic  Patient Position (if appropriate) Lying  Pulse Rate 87  Pulse Rate Source Monitor  ECG Heart Rate 88  Resp (!) 22  Weight 101.3 kg  Type of Weight Post-Dialysis  Oxygen  Therapy  SpO2 100 %  O2 Device Room Air  Patient Activity (if Appropriate) In bed  Pulse Oximetry Type Continuous  Hepatitis B Pre Treatment Patient Checks  Hepatitis B Surface Antigen Results Unknown  Date Hepatitis B Surface Antigen Drawn  (Called Lab wont be resulted until tomorrow  machine put in chem)  During Treatment Monitoring  Blood Flow Rate (mL/min) 0 mL/min  Arterial Pressure (mmHg) 22.63 mmHg  Venous Pressure (mmHg) -39.19 mmHg  TMP (mmHg) 7.27 mmHg  Ultrafiltration Rate (mL/min) 1175 mL/min  Dialysate Flow Rate (mL/min) 300 ml/min  Dialysate Potassium Concentration 3  Dialysate Calcium  Concentration 2.5  Duration of HD Treatment -hour(s) 3 hour(s)  Cumulative Fluid Removed (mL) per Treatment  3000.17  HD Safety Checks Performed Yes  Intra-Hemodialysis Comments Tolerated well;Tx completed  Post Treatment  Dialyzer Clearance Lightly streaked  Liters Processed 72  Fluid Removed (mL) 3000 mL  Tolerated HD Treatment Yes  AVG/AVF Arterial Site Held (minutes) 10 minutes  AVG/AVF Venous Site Held (minutes) 10 minutes  Note  Patient Observations Patient tolerated treatment well

## 2024-05-20 NOTE — Progress Notes (Addendum)
 [name] is an 63 y.o. male with ESRD on HD, DM, CHF, pAFib admitted with SOB after dialysis. CXR notable for vascular congestion. He did receive full dialysis on Friday. He was pretty close to his dry weight so only had minimal fluid removal. Has had minimal UF with HD for past several treatments. His dry weight was lowered earlier this week. He reports cough and poor appetite for past few weeks. Flu/Covid negative.  Dialysis MWF at [facility]. Last dialysis Friday. Left 0.5kg under dry weight.    Dialysis Orders:  Unit: [facility] Schedule: MWF Time: 3:00  EDW: just decr to 102.5kg from 103kg (left at 102kg last tx) 2K/2.5Ca  Flows: 450/600 Bath: 2K/2.5CA Access: AVF Heparin : 5000  ESA: Mircera 75 q 2 wks  VDRA: Hectorol  7 q HD, Parsabiv 15 q HD  Assessment/Plan: Dyspnea 2/2 volume overload. Pulmonary edema on CXR. ESRD patient losing body weight and needs EDW adjusted. Will have to lower dry weight at discharge. Tolerated HD on Sat with 3L net UF with resolution of dyspnea. - need to lower EDW at d/c -> decrease to 101kg - can wait to Tues for next outpt hd (his center is running holiday schedule this week Sun Tues Fri) ESRD.  HD MWF. Has not missed HD. Did very well with the early  treatment today replacing today's holiday schedule HD. Next HD tues. Hypertension. BP acceptable. Midodrine  10 mg with HD three times per week. Volume. As above. Optimized volume with HD Anemia. Hgb 10.1. No ESA needs.  Metabolic bone disease.  Ca elevated. No VDRA here. Follow trends Afib. On warfarin, digoxin . Per primary   Subjective: Feeling much better with resolution of SOB; ambulating comfortably overnight. Denies n/v/cp. No cramping w hd yest    Chemistry and CBC: Creatinine, Ser  Date/Time Value Ref Range Status  05/20/2024 07:11 AM 7.19 (H) 0.61 - 1.24 mg/dL Final  87/73/7974 87:91 PM 5.42 (H) 0.61 - 1.24 mg/dL Final  90/95/7974 89:58 AM 8.98 (H) 0.76 - 1.27 mg/dL Final  94/77/7974 96:99 AM 4.77  (H) 0.61 - 1.24 mg/dL Final  94/78/7974 94:87 AM 9.14 (H) 0.61 - 1.24 mg/dL Final  94/79/7974 87:74 PM 7.66 (H) 0.61 - 1.24 mg/dL Final  97/81/7974 88:64 AM 8.13 (H) 0.61 - 1.24 mg/dL Final  89/73/7975 90:69 AM 12.70 (H) 0.61 - 1.24 mg/dL Final  89/73/7975 96:81 AM 11.67 (H) 0.61 - 1.24 mg/dL Final  89/74/7975 98:73 PM 11.10 (H) 0.61 - 1.24 mg/dL Final  89/89/7975 87:57 PM 7.12 (H) 0.76 - 1.27 mg/dL Final    Comment:    **Verified by repeat analysis**  09/21/2022 06:57 AM 10.54 (H) 0.61 - 1.24 mg/dL Final  95/70/7975 88:67 AM 12.10 (H) 0.61 - 1.24 mg/dL Final  95/76/7975 87:99 PM 7.55 (H) 0.61 - 1.24 mg/dL Final  98/97/7975 87:66 AM 8.92 (H) 0.61 - 1.24 mg/dL Final  98/98/7975 88:92 PM 4.29 (H) 0.61 - 1.24 mg/dL Final  89/75/7976 89:81 AM 8.98 (H) 0.76 - 1.27 mg/dL Final  93/79/7976 89:78 AM 8.53 (H) 0.76 - 1.27 mg/dL Final  88/83/7977 89:46 AM 5.79 (H) 0.61 - 1.24 mg/dL Final  98/84/7977 96:42 AM 7.15 (H) 0.61 - 1.24 mg/dL Final  98/85/7977 96:98 PM 10.32 (H) 0.61 - 1.24 mg/dL Final  98/85/7977 94:46 AM 9.61 (H) 0.61 - 1.24 mg/dL Final  98/86/7977 93:86 AM 7.28 (H) 0.61 - 1.24 mg/dL Final  98/87/7977 94:58 PM 6.38 (H) 0.61 - 1.24 mg/dL Final  87/96/7978 96:99 PM 11.44 (H) 0.61 - 1.24 mg/dL Final  04/24/2020 11:20 PM 9.90 (H) 0.61 - 1.24 mg/dL Final  93/75/7978 89:87 AM 10.06 (H) 0.76 - 1.27 mg/dL Final    Comment:    **Verified by repeat analysis**  03/15/2019 09:00 AM 11.10 (H) 0.61 - 1.24 mg/dL Final  91/71/7979 93:68 AM 12.44 (H) 0.61 - 1.24 mg/dL Final  91/72/7979 98:99 PM 10.12 (H) 0.61 - 1.24 mg/dL Final  92/97/7979 96:51 AM 8.53 (H) 0.61 - 1.24 mg/dL Final  92/98/7979 92:97 PM 7.36 (H) 0.61 - 1.24 mg/dL Final  93/74/7979 97:64 PM 9.94 (H) 0.61 - 1.24 mg/dL Final  96/74/7979 91:62 AM 11.78 (H) 0.61 - 1.24 mg/dL Final  96/75/7979 95:72 PM 10.38 (H) 0.61 - 1.24 mg/dL Final  96/89/7979 92:44 AM 15.04 (H) 0.61 - 1.24 mg/dL Final  96/89/7979 95:61 AM 14.87 (H) 0.61 - 1.24 mg/dL  Final  96/90/7979 92:94 AM 12.77 (H) 0.61 - 1.24 mg/dL Final  97/83/7979 95:53 AM 7.64 (H) 0.61 - 1.24 mg/dL Final  98/70/7979 93:45 AM 12.04 (H) 0.61 - 1.24 mg/dL Final  98/71/7979 97:72 PM 10.61 (H) 0.61 - 1.24 mg/dL Final  90/79/7980 90:47 AM 9.62 (H) 0.61 - 1.24 mg/dL Final  90/81/7980 91:89 AM 11.38 (H) 0.61 - 1.24 mg/dL Final  90/83/7980 91:57 AM 12.64 (H) 0.61 - 1.24 mg/dL Final  90/84/7980 93:62 AM 10.16 (H) 0.61 - 1.24 mg/dL Final  90/85/7980 93:71 AM 7.80 (H) 0.61 - 1.24 mg/dL Final  90/86/7980 87:74 PM 8.84 (H) 0.61 - 1.24 mg/dL Final  90/88/7980 92:66 AM 10.79 (H) 0.61 - 1.24 mg/dL Final  90/89/7980 95:63 AM 8.22 (H) 0.61 - 1.24 mg/dL Final  90/90/7980 95:42 PM 6.79 (H) 0.61 - 1.24 mg/dL Final  90/90/7980 92:66 AM 13.13 (H) 0.61 - 1.24 mg/dL Final  90/92/7980 97:43 AM 7.59 (H) 0.61 - 1.24 mg/dL Final   Recent Labs  Lab 05/18/24 1208 05/20/24 0711  NA 138 135  K 3.5 3.7  CL 96* 96*  CO2 30 23  GLUCOSE 66* 80  BUN 12 21  CREATININE 5.42* 7.19*  CALCIUM  10.3 10.2   Recent Labs  Lab 05/18/24 1208 05/20/24 0711  WBC 6.2 6.5  NEUTROABS 4.1  --   HGB 10.1* 10.8*  HCT 31.1* 34.0*  MCV 90.9 91.4  PLT 201 238   Liver Function Tests: No results for input(s): AST, ALT, ALKPHOS, BILITOT, PROT, ALBUMIN  in the last 168 hours. No results for input(s): LIPASE, AMYLASE in the last 168 hours. No results for input(s): AMMONIA in the last 168 hours. Cardiac Enzymes: No results for input(s): CKTOTAL, CKMB, CKMBINDEX, TROPONINI in the last 168 hours. Iron  Studies: No results for input(s): IRON , TIBC, TRANSFERRIN, FERRITIN in the last 72 hours. PT/INR: @LABRCNTIP (inr:5)  Xrays/Other Studies: ) Results for orders placed or performed during the hospital encounter of 05/18/24 (from the past 48 hours)  Resp panel by RT-PCR (RSV, Flu A&B, Covid) Anterior Nasal Swab     Status: None   Collection Time: 05/18/24 12:06 PM   Specimen: Anterior Nasal  Swab  Result Value Ref Range   SARS Coronavirus 2 by RT PCR NEGATIVE NEGATIVE    Comment: (NOTE) SARS-CoV-2 target nucleic acids are NOT DETECTED.  The SARS-CoV-2 RNA is generally detectable in upper respiratory specimens during the acute phase of infection. The lowest concentration of SARS-CoV-2 viral copies this assay can detect is 138 copies/mL. A negative result does not preclude SARS-Cov-2 infection and should not be used as the sole basis for treatment or other patient management decisions. A negative result may occur with  improper specimen collection/handling, submission of specimen other than nasopharyngeal swab, presence of viral mutation(s) within the areas targeted by this assay, and inadequate number of viral copies(<138 copies/mL). A negative result must be combined with clinical observations, patient history, and epidemiological information. The expected result is Negative.  Fact Sheet for Patients:  bloggercourse.com  Fact Sheet for Healthcare Providers:  seriousbroker.it  This test is no t yet approved or cleared by the [place]  FDA and  has been authorized for detection and/or diagnosis of SARS-CoV-2 by FDA under an Emergency Use Authorization (EUA). This EUA will remain  in effect (meaning this test can be used) for the duration of the COVID-19 declaration under Section 564(b)(1) of the Act, 21 U.S.C.section 360bbb-3(b)(1), unless the authorization is terminated  or revoked sooner.       Influenza A by PCR NEGATIVE NEGATIVE   Influenza B by PCR NEGATIVE NEGATIVE    Comment: (NOTE) The Xpert Xpress SARS-CoV-2/FLU/RSV plus assay is intended as an aid in the diagnosis of influenza from Nasopharyngeal swab specimens and should not be used as a sole basis for treatment. Nasal washings and aspirates are unacceptable for Xpert Xpress SARS-CoV-2/FLU/RSV testing.  Fact Sheet for  Patients: bloggercourse.com  Fact Sheet for Healthcare Providers: seriousbroker.it  This test is not yet approved or cleared by the [place]  FDA and has been authorized for detection and/or diagnosis of SARS-CoV-2 by FDA under an Emergency Use Authorization (EUA). This EUA will remain in effect (meaning this test can be used) for the duration of the COVID-19 declaration under Section 564(b)(1) of the Act, 21 U.S.C. section 360bbb-3(b)(1), unless the authorization is terminated or revoked.     Resp Syncytial Virus by PCR NEGATIVE NEGATIVE    Comment: (NOTE) Fact Sheet for Patients: bloggercourse.com  Fact Sheet for Healthcare Providers: seriousbroker.it  This test is not yet approved or cleared by the [place]  FDA and has been authorized for detection and/or diagnosis of SARS-CoV-2 by FDA under an Emergency Use Authorization (EUA). This EUA will remain in effect (meaning this test can be used) for the duration of the COVID-19 declaration under Section 564(b)(1) of the Act, 21 U.S.C. section 360bbb-3(b)(1), unless the authorization is terminated or revoked.  Performed at [facility], [address] [place], [place] [address]   CBC with Differential     Status: Abnormal   Collection Time: 05/18/24 12:08 PM  Result Value Ref Range   WBC 6.2 4.0 - 10.5 K/uL   RBC 3.42 (L) 4.22 - 5.81 MIL/uL   Hemoglobin 10.1 (L) 13.0 - 17.0 g/dL   HCT 68.8 (L) 60.9 - 47.9 %   MCV 90.9 80.0 - 100.0 fL   MCH 29.5 26.0 - 34.0 pg   MCHC 32.5 30.0 - 36.0 g/dL   RDW 81.6 (H) 88.4 - 84.4 %   Platelets 201 150 - 400 K/uL   nRBC 0.0 0.0 - 0.2 %   Neutrophils Relative % 67 %   Neutro Abs 4.1 1.7 - 7.7 K/uL   Lymphocytes Relative 22 %   Lymphs Abs 1.4 0.7 - 4.0 K/uL   Monocytes Relative 9 %   Monocytes Absolute 0.6 0.1 - 1.0 K/uL   Eosinophils Relative 1 %    Eosinophils Absolute 0.1 0.0 - 0.5 K/uL   Basophils Relative 1 %   Basophils Absolute 0.1 0.0 - 0.1 K/uL   Immature Granulocytes 0 %   Abs Immature Granulocytes 0.02 0.00 - 0.07 K/uL    Comment: Performed at [facility], [address], [place], [place] [address]  Basic metabolic panel     Status: Abnormal   Collection Time: 05/18/24 12:08 PM  Result Value Ref Range   Sodium 138 135 - 145 mmol/L   Potassium 3.5 3.5 - 5.1 mmol/L   Chloride 96 (L) 98 - 111 mmol/L   CO2 30 22 - 32 mmol/L   Glucose, Bld 66 (L) 70 - 99 mg/dL    Comment: Glucose reference range applies only to samples taken after fasting for at least 8 hours.   BUN 12 8 - 23 mg/dL   Creatinine, Ser 4.57 (H) 0.61 - 1.24 mg/dL   Calcium  10.3 8.9 - 10.3 mg/dL   GFR, Estimated 11 (L) >60 mL/min    Comment: (NOTE) Calculated using the CKD-EPI Creatinine Equation (2021)    Anion gap 12 5 - 15    Comment: Performed at [facility], [address], [place], [place] [address]  Pro Brain natriuretic peptide     Status: Abnormal   Collection Time: 05/18/24  8:50 PM  Result Value Ref Range   Pro Brain Natriuretic Peptide >35,000.0 (H) <300.0 pg/mL    Comment: (NOTE) Age Group        Cut-Points    Interpretation  < 50 years     450 pg/mL       NT-proBNP > 450 pg/mL indicates                                ADHF is likely              50 to 75 years  900 pg/mL      NT-proBNP > 900 pg/mL indicates          ADHF is likely  > 75 years      1800 pg/mL     NT-proBNP > 1800 pg/mL indicates          ADHF is likely                           All ages    Results between       Indeterminate. Further clinical             300 and the cut-   information is needed to determine            point for age group   if ADHF is present.                                                             Elecsys proBNP II/ Elecsys proBNP II STAT           Cut-Point                       Interpretation  300 pg/mL                     NT-proBNP <300pg/mL indicates                             ADHF is not likely  Performed at [facility], [address]  [place], [place] [address]   CBG monitoring, ED     Status: None   Collection Time: 05/19/24  6:28 AM  Result Value Ref Range   Glucose-Capillary 70 70 - 99 mg/dL    Comment: Glucose reference range applies only to samples taken after fasting for at least 8 hours.  POC CBG, ED     Status: None   Collection Time: 05/19/24  7:56 AM  Result Value Ref Range   Glucose-Capillary 79 70 - 99 mg/dL    Comment: Glucose reference range applies only to samples taken after fasting for at least 8 hours.  Protime-INR     Status: Abnormal   Collection Time: 05/19/24  9:05 AM  Result Value Ref Range   Prothrombin  Time 18.3 (H) 11.4 - 15.2 seconds   INR 1.4 (H) 0.8 - 1.2    Comment: (NOTE) INR goal varies based on device and disease states. Performed at [facility], [address] [place], [place] [address]   Digoxin  level     Status: Abnormal   Collection Time: 05/19/24  9:05 AM  Result Value Ref Range   Digoxin  Level <0.6 (L) 0.8 - 2.0 ng/mL    Comment: Performed at [facility], [address] [place], [place] [address]  Glucose, capillary     Status: None   Collection Time: 05/19/24  1:51 PM  Result Value Ref Range   Glucose-Capillary 86 70 - 99 mg/dL    Comment: Glucose reference range applies only to samples taken after fasting for at least 8 hours.  Glucose, capillary     Status: None   Collection Time: 05/19/24  6:03 PM  Result Value Ref Range   Glucose-Capillary 70 70 - 99 mg/dL    Comment: Glucose reference range applies only to samples taken after fasting for at least 8 hours.  Hepatitis B surface antibody,qualitative     Status: Abnormal   Collection Time: 05/19/24  8:15 PM  Result Value Ref Range   Hep B S Ab Reactive (A) NON REACTIVE    Comment: (NOTE) Consistent with  immunity, greater than 9.9 mIU/mL.  Performed at [facility], [address], [place], [place] [address]   Hepatitis B surface antigen     Status: None   Collection Time: 05/19/24  8:15 PM  Result Value Ref Range   Hepatitis B Surface Ag NON REACTIVE NON REACTIVE    Comment: Performed at [facility], [address], [place], [place] [address]  Glucose, capillary     Status: None   Collection Time: 05/20/24 12:58 AM  Result Value Ref Range   Glucose-Capillary 78 70 - 99 mg/dL    Comment: Glucose reference range applies only to samples taken after fasting for at least 8 hours.  Basic metabolic panel     Status: Abnormal   Collection Time: 05/20/24  7:11 AM  Result Value Ref Range   Sodium 135 135 - 145 mmol/L   Potassium 3.7 3.5 - 5.1 mmol/L   Chloride 96 (L) 98 - 111 mmol/L   CO2 23 22 - 32 mmol/L   Glucose, Bld 80 70 - 99 mg/dL    Comment: Glucose reference range applies only to samples taken after fasting for at least 8 hours.   BUN 21 8 - 23 mg/dL   Creatinine, Ser 2.80 (H) 0.61 - 1.24 mg/dL   Calcium  10.2 8.9 - 10.3 mg/dL   GFR, Estimated 8 (L) >60 mL/min    Comment: (NOTE)  Calculated using the CKD-EPI Creatinine Equation (2021)    Anion gap 15 5 - 15    Comment: Performed at [facility], [address], [place], [place] [address]  CBC     Status: Abnormal   Collection Time: 05/20/24  7:11 AM  Result Value Ref Range   WBC 6.5 4.0 - 10.5 K/uL   RBC 3.72 (L) 4.22 - 5.81 MIL/uL   Hemoglobin 10.8 (L) 13.0 - 17.0 g/dL   HCT 65.9 (L) 60.9 - 47.9 %   MCV 91.4 80.0 - 100.0 fL   MCH 29.0 26.0 - 34.0 pg   MCHC 31.8 30.0 - 36.0 g/dL   RDW 81.6 (H) 88.4 - 84.4 %   Platelets 238 150 - 400 K/uL   nRBC 0.0 0.0 - 0.2 %    Comment: Performed at [facility], [address] [place], [place] [address]  Protime-INR     Status: Abnormal   Collection Time: 05/20/24  7:11 AM  Result Value Ref Range   Prothrombin  Time 18.9 (H) 11.4 - 15.2 seconds   INR 1.5  (H) 0.8 - 1.2    Comment: (NOTE) INR goal varies based on device and disease states. Performed at [facility], [address] [place], [place], [place] [address]    DG Chest Port 1 View Result Date: 05/19/2024 CLINICAL DATA:  Dyspnea. EXAM: PORTABLE CHEST 1 VIEW COMPARISON:  05/18/2024 FINDINGS: Low lung volumes. The cardio pericardial silhouette is enlarged. Vascular congestion and diffuse interstitial opacity suggests edema. Status post left shoulder replacement. Telemetry leads overlie the chest. IMPRESSION: Low volume film with vascular congestion and diffuse interstitial opacity suggesting edema. Electronically Signed   By: Camellia Candle M.D.   On: 05/19/2024 10:27   DG Chest 2 View Result Date: 05/18/2024 EXAM: 2 VIEW(S) XRAY OF THE CHEST 05/18/2024 12:35:00 PM COMPARISON: 03/18/2023 CLINICAL HISTORY: SOB FINDINGS: LUNGS AND PLEURA: Left base airspace consolidation. Trace bilateral pleural effusions. Increased interstitial markings with cephalization and possible developing Kerley B-lines, probably pulmonary edema. No pneumothorax. HEART AND MEDIASTINUM: Cardiomegaly. Atherosclerotic plaque. BONES AND SOFT TISSUES: Bilateral shoulder arthroplasty noted. No acute osseous abnormality. IMPRESSION: 1. Increased  pulmonary edema. 2. Left base airspace consolidation. 3. Trace bilateral pleural effusions. 4. Cardiomegaly. 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 Claudene follows   Diabetes mellitus    pt. states he's borderline diabetic., no longer taking med,- off med. since 2013   Dialysis patient    Mon-Wed-Fri([facility])- Left AV fistula   Diverticulitis 03/2015   reoccurred in December 2016   Diverticulitis of intestine without perforation or abscess without bleeding 04/21/2015   Dizziness 11/22/2018   DVT (deep venous thrombosis) (HCC)    ESRD (end stage renal disease) (HCC)    GERD (gastroesophageal reflux disease)    Gout    Hemorrhage of left kidney 01/13/2018  History of nephrectomy 07/04/2012   History of right nephrectomy in 2002 for renal cell carcinoma    History of unilateral nephrectomy    Hypertension    Hypotension due to hypovolemia 06/04/2020   Low iron     Myocardial infarction [facility]) ?2006   Renal cell carcinoma    dialysis- MWF- [address]- Dr. Froylan follows.   Renal insufficiency    Shortness of breath 05/19/2011   at rest, lying down, w/exertion   Stroke [facility]) 02/2011   05/19/11 denies residual   Umbilical hernia 05/19/2011   unrepaired   Wears glasses     PSH:   Past Surgical History:  Procedure Laterality Date   A/V FISTULAGRAM N/A 06/30/2023   Procedure: A/V Fistulagram;  Surgeon: Melia Lynwood ORN, MD;  Location: [facility] INVASIVE CV LAB;  Service: Cardiovascular;  Laterality: N/A;   ANAL FISTULOTOMY  08/15/2018   AV FISTULA PLACEMENT  03/29/2011   Procedure: ARTERIOVENOUS (AV) FISTULA CREATION;  Surgeon: Redell Lurena Door, MD;  Location: [facility] OR;  Service: Vascular;  Laterality: Left;  LEFT RADIOCEPHALIC , Arteriovenous [phone])   CARDIAC CATHETERIZATION  05/21/11   CARDIAC CATHETERIZATION N/A 03/16/2016   Procedure: Left Heart Cath and Coronary Angiography;  Surgeon: Salena Negri, MD;  Location: [facility] INVASIVE CV LAB;  Service: Cardiovascular;  Laterality: N/A;   CHOLECYSTECTOMY N/A 02/12/2016   Procedure: LAPAROSCOPIC CHOLECYSTECTOMY WITH  INTRAOPERATIVE CHOLANGIOGRAM;  Surgeon: Krystal Russell, MD;  Location: [facility] OR;  Service: General;  Laterality: N/A;   COLONOSCOPY WITH PROPOFOL  N/A 02/01/2017   Procedure: COLONOSCOPY WITH PROPOFOL ;  Surgeon: Kristie Lamprey, MD;  Location: [facility] ENDOSCOPY;  Service: Endoscopy;  Laterality: N/A;   dialysis cath placed     ESOPHAGOGASTRODUODENOSCOPY (EGD) WITH PROPOFOL  N/A 12/02/2015   Procedure: ESOPHAGOGASTRODUODENOSCOPY (EGD) WITH PROPOFOL ;  Surgeon: Lamprey Kristie, MD;  Location: [facility] ENDOSCOPY;  Service: Endoscopy;  Laterality: N/A;   EVALUATION UNDER ANESTHESIA WITH HEMORRHOIDECTOMY N/A 08/15/2018   Procedure: HEMORRHOID LIGATION/PEXY;  Surgeon: Sheldon Standing, MD;  Location: [facility] OR;  Service: General;  Laterality: N/A;   FINGER SURGERY     L pinkie finger- ORIF- /w remaining hardware - 1990's     FISTULOTOMY N/A 08/15/2018   Procedure: SUPERFICIAL ANAL FISTULOTOMY;  Surgeon: Sheldon Standing, MD;  Location: [facility] OR;  Service: General;  Laterality: N/A;   HEMORRHOID SURGERY     HERNIA REPAIR N/A 07/04/2012   Umbilical hernia repair   INSERTION OF DIALYSIS CATHETER N/A 01/27/2016   Procedure: INSERTION OF DIALYSIS CATHETER;  Surgeon: Penne Lonni Colorado, MD;  Location: [facility] OR;  Service: Vascular;  Laterality: N/A;   INSERTION OF MESH N/A 07/04/2012   Procedure: INSERTION OF MESH;  Surgeon: Redell Faith, DO;  Location: [facility] OR;  Service: General;  Laterality: N/A;   IR EMBO ART  VEN HEMORR LYMPH EXTRAV  INC GUIDE ROADMAPPING  01/13/2018   IR FLUORO GUIDE CV LINE RIGHT  01/13/2018   IR IVC FILTER PLMT / S&I /IMG GUID/MOD SED  01/27/2018   IR RENAL SELECTIVE  UNI INC S&I MOD SED  01/13/2018   IR US  GUIDE VASC ACCESS RIGHT  01/13/2018   KIDNEY CYST REMOVAL  2019   LAPAROSCOPIC LYSIS OF ADHESIONS N/A 02/12/2016   Procedure: LAPAROSCOPIC LYSIS OF ADHESIONS;  Surgeon: Krystal Russell, MD;  Location: [facility] OR;  Service: General;  Laterality: N/A;   LEFT AND RIGHT HEART CATHETERIZATION WITH CORONARY ANGIOGRAM N/A 08/04/2012    Procedure: LEFT AND RIGHT HEART CATHETERIZATION WITH CORONARY ANGIOGRAM;  Surgeon: Salena GORMAN Negri, MD;  Location: [facility]  CATH LAB;  Service: Cardiovascular;  Laterality: N/A;   NEPHRECTOMY  2000   right   PERIPHERAL VASCULAR BALLOON ANGIOPLASTY Left 06/30/2023   Procedure: PERIPHERAL VASCULAR BALLOON ANGIOPLASTY;  Surgeon: Melia Lynwood ORN, MD;  Location: [facility] INVASIVE CV LAB;  Service: Cardiovascular;  Laterality: Left;  60% cephalic vein   REVERSE SHOULDER ARTHROPLASTY Right 09/20/2022   Procedure: REVERSE SHOULDER ARTHROPLASTY;  Surgeon: Sharl Selinda Dover, MD;  Location: [facility] OR;  Service: Orthopedics;  Laterality: Right;  120   REVERSE SHOULDER ARTHROPLASTY Left 03/18/2023   Procedure: REVERSE SHOULDER ARTHROPLASTY;  Surgeon: Sharl Selinda Dover, MD;  Location: [facility] OR;  Service: Orthopedics;  Laterality: Left;  120   REVISON OF ARTERIOVENOUS FISTULA Left 06/05/2013   Procedure: REVISON OF LEFT RADIOCEPHALIC  ARTERIOVENOUS FISTULA;  Surgeon: Redell LITTIE Door, MD;  Location: [facility] OR;  Service: Vascular;  Laterality: Left;   REVISON OF ARTERIOVENOUS FISTULA Left 01/27/2016   Procedure: REVISION OF LEFT UPPER EXTREMITY ARTERIOVENOUS FISTULA;  Surgeon: Penne Lonni Colorado, MD;  Location: [facility] OR;  Service: Vascular;  Laterality: Left;   REVISON OF ARTERIOVENOUS FISTULA Left 08/03/2016   Procedure: REVISON OF Left arm ARTERIOVENOUS FISTULA;  Surgeon: Carlin FORBES Haddock, MD;  Location: [facility] OR;  Service: Vascular;  Laterality: Left;   REVISON OF ARTERIOVENOUS FISTULA Left 05/06/2017   Procedure: REVISION PLICATION OF ARTERIOVENOUS FISTULA LEFT ARM;  Surgeon: Oris Krystal FALCON, MD;  Location: [facility] OR;  Service: Vascular;  Laterality: Left;   REVISON OF ARTERIOVENOUS FISTULA Left 02/01/2019   Procedure: REVISION OF ARTERIOVENOUS FISTULA LEFT ARM;  Surgeon: Gretta Lonni PARAS, MD;  Location: [facility] OR;  Service: Vascular;  Laterality: Left;   REVISON OF ARTERIOVENOUS FISTULA Left 03/15/2019   Procedure: REVISION PLICATION OF  ARTERIOVENOUS FISTULA LEFT ARM;  Surgeon: Gretta Lonni PARAS, MD;  Location: [facility] OR;  Service: Vascular;  Laterality: Left;   RIGHT HEART CATHETERIZATION N/A 05/21/2011   Procedure: RIGHT HEART CATH;  Surgeon: Salena GORMAN Negri, MD;  Location: [facility] CATH LAB;  Service: Cardiovascular;  Laterality: N/A;   smashed  1990's   left pinky; have a plate in there   UMBILICAL HERNIA REPAIR N/A 07/04/2012   Procedure: LAPAROSCOPIC UMBILICAL HERNIA;  Surgeon: Redell Faith, DO;  Location: [facility] OR;  Service: General;  Laterality: N/A;   US  ECHOCARDIOGRAPHY  05/20/11    Allergies: Allergies[1]  Medications:   Prior to Admission medications  Medication Sig Start Date End Date Taking? Authorizing Provider  oxyCODONE  (OXY IR/ROXICODONE ) 5 MG immediate release tablet Take 5 mg by mouth every 8 (eight) hours as needed for severe pain (pain score 7-10). 11/03/23  Yes [provider]  acetaminophen  (TYLENOL ) 500 MG tablet Take 500 mg by mouth as needed.    [provider]  albuterol  (VENTOLIN  HFA) 108 (90 Base) MCG/ACT inhaler Inhale 2 puffs into the lungs every 6 (six) hours as needed for wheezing or shortness of breath. 04/12/24   Georgina Speaks, FNP  atorvastatin  (LIPITOR) 40 MG tablet Take 1 tablet (40 mg total) by mouth daily. 10/14/23   Patsy Lenis, MD  calcium  acetate (PHOSLO ) 667 MG capsule Take 1-3 capsules (667-2,001 mg total) by mouth See admin instructions. Take 2001 mg capsules with meals three times daily and 667 mg capsule with snacks. 11/23/18   Negri Salena, MD  clotrimazole  (LOTRIMIN ) 1 % cream APPLY TO AFFECTED AREA TWICE A DAY 03/07/24   Moore, Janece, FNP  Control Gel Formula Dressing (DUODERM CGF EXTRA THIN) MISC Apply 1 each topically daily as needed. Patient  not taking: Reported on 04/12/2024 10/26/20   Moore, Janece, FNP  digoxin  (LANOXIN ) 0.125 MG tablet Take 1 tablet (0.125 mg total) by mouth every Monday, Wednesday, and Friday. 06/09/20   Rai, Nydia POUR, MD  gabapentin   (NEURONTIN ) 300 MG capsule Take 1 capsule (300 mg total) by mouth 3 (three) times daily. 03/14/24   Georgina Speaks, FNP  glucose blood test strip Use to check blood sugar 3 times a day. 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 04/12/24   Georgina Speaks, FNP  Lancets [facility] DELICA PLUS [place]) MISC Use to check blood sugar 3 times a day. Dx code e11.65 10/13/21   Moore, Janece, FNP  loperamide  (IMODIUM  A-D) 2 MG tablet Take 1 tablet (2 mg total) by mouth 4 (four) times daily as needed for diarrhea or loose stools. Also available OTC. 06/09/20   Rai, Nydia POUR, MD  midodrine  (PROAMATINE ) 10 MG tablet Take 1 tablet (10 mg total) by mouth 3 (three) times daily. 06/09/20   Rai, Ripudeep K, MD  pantoprazole  (PROTONIX ) 40 MG tablet Take 40 mg by mouth 2 (two) times daily.    [provider]  promethazine -dextromethorphan  (PROMETHAZINE -DM) 6.25-15 MG/5ML syrup Take 5 mLs by mouth 2 (two) times daily as needed for cough. 04/29/24   Petrina Pries, NP  UNABLE TO FIND Out patient physical therapy- vestibular therapy for BPPV 08/01/18   Rizwan, Saima, MD  VELPHORO  500 MG chewable tablet Chew 1,500 mg by mouth 3 (three) times daily.    [provider]  warfarin (COUMADIN ) 7.5 MG tablet Take 10 mg by mouth daily.    [provider]    Discontinued Meds:   Medications Discontinued During This Encounter  Medication Reason   promethazine -dextromethorphan  (PROMETHAZINE -DM) 6.25-15 MG/5ML syrup 5 mL Duplicate   pentafluoroprop-tetrafluoroeth (GEBAUERS) aerosol 1 Application Patient Transfer   lidocaine  (PF) (XYLOCAINE ) 1 % injection 5 mL Patient Transfer   lidocaine -prilocaine  (EMLA ) cream 1 Application Patient Transfer   heparin  injection 1,000 Units Patient Transfer   anticoagulant sodium citrate  solution 5 mL Patient Transfer   alteplase  (CATHFLO ACTIVASE )  injection 2 mg Patient Transfer   heparin  injection 5,000 Units Patient Transfer    Social History:  reports that he has never smoked. He has never used smokeless tobacco. He reports that he does not currently use drugs. He reports that he does not drink alcohol.  Family History:   Family History  Problem Relation Age of Onset   Hypertension Mother    Kidney disease Mother     Blood pressure 114/67, pulse (!) 102, temperature (!) 97.3 F (36.3 C), temperature source Oral, resp. rate 18, height 6' 1 (1.854 m), weight 100.1 kg, SpO2 99%. Physical Exam: General: Alert, nad, on room air  Head: NCAT sclera not icteric CV: RRR, no murmur Pulm: +rales at bases  Abdomen: non-tender, no masses  Lower extremities: no sig LE edema  Neuro: A & O X 3.  Psych:  Normal affect  Dialysis Access: L AVF +bruit      Kayron Hicklin, LYNWOOD ORN, MD 05/20/2024, 7:46 AM      [1]  Allergies Allergen Reactions   Ace Inhibitors Itching, Cough and Other (See Comments)

## 2024-05-20 NOTE — Progress Notes (Signed)
 Pt just got back from HD.   Bari HERO Chellsie Gomer

## 2024-05-20 NOTE — Plan of Care (Signed)
 [facility] Patient Discharge Orders- [facility] CLINIC: [facility]  Patient's name: [name] Admit/DC Dates: 05/18/2024 - 05/20/2024  Discharge Diagnoses: Dyspnea/pulmonary edema. Resolved with extra HD.  Lowering dry weight.   Outpatient Dialysis Orders:  -Heparin : No change -EDW 101 kg   -[place]: No change   Anemia Aranesp : Given: --   Date of last dose/amount: --   PRBC's Given: -- Date/# of units: -- ESA dose for discharge: No change   Recent Labs  Lab 05/20/24 0711  HGB 10.8*  K 3.7  CALCIUM  10.2    Access intervention/Change:  none    Medications: -IV Antibiotics: none  -Anticoagulation: On warfarin    OTHER/APPTS/LABS   Completed by: Maisie Ronnald Acosta PA-C   D/C Meds to be reconciled by nurse after every discharge.    Reviewed by: MD:______ RN_______

## 2024-05-21 ENCOUNTER — Telehealth: Payer: Self-pay | Admitting: Nephrology

## 2024-05-21 ENCOUNTER — Telehealth: Payer: Self-pay

## 2024-05-21 NOTE — Transitions of Care (Post Inpatient/ED Visit) (Signed)
 "  05/21/2024  Name: [name] MRN: [id number] DOB: [birth year]  Today's TOC FU Call Status: Today's TOC FU Call Status:: Successful TOC FU Call Completed TOC FU Call Complete Date: 05/21/24  Patient's Name and Date of Birth confirmed. Name, DOB  Transition Care Management Follow-up Telephone Call Date of Discharge: 05/20/24 Discharge Facility: [facility]) Type of Discharge: Inpatient Admission Primary Inpatient Discharge Diagnosis:: Acute Pulmonary Edema How have you been since you were released from the hospital?: Better Any questions or concerns?: No  Items Reviewed: Did you receive and understand the discharge instructions provided?: Yes Medications obtained,verified, and reconciled?: Yes (Medications Reviewed) Any new allergies since your discharge?: No Dietary orders reviewed?: Yes Type of Diet Ordered:: Renal Diet Do you have support at home?: Yes  Medications Reviewed Today: Medications Reviewed Today     Reviewed by Lavelle Charmaine NOVAK, LPN (Licensed Practical Nurse) on 05/21/24 at 1343  Med List Status: <None>   Medication Order Taking? Sig Documenting Provider Last Dose Status Informant  acetaminophen  (TYLENOL ) 500 MG tablet [id number]  Take 500 mg by mouth as needed. [provider]  Active Spouse/Significant Other  albuterol  (VENTOLIN  HFA) 108 (90 Base) MCG/ACT inhaler [id number]  Inhale 2 puffs into the lungs every 6 (six) hours as needed for wheezing or shortness of breath. Georgina Speaks, FNP  Active   atorvastatin  (LIPITOR) 40 MG tablet [id number]  Take 1 tablet (40 mg total) by mouth daily. Patsy Lenis, MD  Active   calcium  acetate (PHOSLO ) 667 MG capsule [id number]  Take 1-3 capsules (667-2,001 mg total) by mouth See admin instructions. Take 2001 mg capsules with meals three times daily and 667 mg capsule with snacks. Claudene Pacific, MD  Active Spouse/Significant Other  clotrimazole  (LOTRIMIN ) 1 % cream [id number]  APPLY TO AFFECTED AREA TWICE A DAY  Moore, Janece, FNP  Active   Control Gel Formula Dressing (DUODERM CGF EXTRA THIN) MISC [id number]  Apply 1 each topically daily as needed.  Patient not taking: Reported on 04/12/2024   Georgina Speaks, FNP  Active Spouse/Significant Other  digoxin  (LANOXIN ) 0.125 MG tablet [id number]  Take 1 tablet (0.125 mg total) by mouth every Monday, Wednesday, and Friday. Rai, Nydia POUR, MD  Active Spouse/Significant Other  gabapentin  (NEURONTIN ) 300 MG capsule [id number]  Take 1 capsule (300 mg total) by mouth 2 (two) times daily. Cindy Garnette POUR, MD  Active   glucose blood test strip [id number]  Use to check blood sugar 3 times a day. 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 Georgina Speaks, FNP  Active   Lancets [facility] DELICA PLUS [place]) [place] [id number]  Use to check blood sugar 3 times a day. Dx code e11.65 Georgina Speaks, FNP  Active Spouse/Significant Other  loperamide  (IMODIUM  A-D) 2 MG tablet [id number]  Take 1 tablet (2 mg total) by mouth 4 (four) times daily as needed for diarrhea or loose stools. Also available OTC. Rai, Nydia POUR, MD  Active Spouse/Significant Other  midodrine  (PROAMATINE ) 10 MG tablet [id number]  Take 1 tablet (10 mg total) by mouth 3 (three) times daily. Rai, Nydia POUR, MD  Active Spouse/Significant Other  oxyCODONE  (OXY IR/ROXICODONE ) 5 MG immediate release tablet [id number]  Take 5 mg by mouth every 8 (eight) hours as needed for severe pain (pain score 7-10). [provider]  Active   pantoprazole  (PROTONIX ) 40 MG tablet [id number]  Take 40 mg by mouth 2 (two) times daily. [provider]  Active Spouse/Significant Other  promethazine -dextromethorphan  (PROMETHAZINE -DM) 6.25-15 MG/5ML syrup [id number]  Take 5 mLs by mouth 2 (two) times daily as needed for cough. Petrina Pries, NP  Active   VELPHORO  500 MG chewable  tablet [id number]  Chew 1,500 mg by mouth 3 (three) times daily. [provider]  Active   warfarin (COUMADIN ) 7.5 MG tablet [id number]  Take 10 mg by mouth daily. [provider]  Active Spouse/Significant Other           Med Note CARLEEN GEORGIANN JONETTA [place] Mar 10, 2023  1:17 PM)    Med List Note Mylo Powell CROME, CPhT 06/27/15 1553): Dialysis Monday, Wednesday, Friday, [facility] [address], [place], [place]            Home Care and Equipment/Supplies: Were Home Health Services Ordered?: NA Any new equipment or medical supplies ordered?: NA  Functional Questionnaire: Do you need assistance with bathing/showering or dressing?: No Do you need assistance with meal preparation?: No Do you need assistance with eating?: No Do you have difficulty maintaining continence: No Do you need assistance with getting out of bed/getting out of a chair/moving?: No Do you have difficulty managing or taking your medications?: No  Follow up appointments reviewed: PCP Follow-up appointment confirmed?: Yes Date of PCP follow-up appointment?: 05/31/24 Follow-up Provider: Gaines Ada FNP Specialist Hospital Follow-up appointment confirmed?: Yes Date of Specialist follow-up appointment?: 05/22/24 Follow-Up Specialty Provider:: Nephrology- [facility] Do you need transportation to your follow-up appointment?: No Do you understand care options if your condition(s) worsen?: Yes-patient verbalized understanding    SIGNATURE Charmaine Bloodgood, LPN [facility] Advisor [place] [facility] You Are. We Are. [facility] Direct Dial  [phone]  "

## 2024-05-21 NOTE — Telephone Encounter (Signed)
 Transition of care contact from inpatient facility  Date of Discharge: 05/20/2024 Date of Contact: 05/21/2024 - attempt #1 Method of contact: Phone  Attempted to contact patient to discuss transition of care from inpatient admission. Patient did not answer the phone. There was no ability to leave a message.  Izetta Boehringer, PA-C [employer] Pager [phone]

## 2024-05-24 ENCOUNTER — Encounter (HOSPITAL_COMMUNITY): Payer: Self-pay | Admitting: *Deleted

## 2024-05-25 ENCOUNTER — Other Ambulatory Visit: Payer: Self-pay | Admitting: Family Medicine

## 2024-05-25 MED ORDER — PROMETHAZINE-DM 6.25-15 MG/5ML PO SYRP: 5.0000 mL | ORAL_SOLUTION | Freq: Three times a day (TID) | ORAL | 0 refills | Status: AC | PRN

## 2024-05-25 NOTE — Progress Notes (Unsigned)
 "  Complete physical exam  Patient: [name]   DOB: 1961-01-13   64 y.o. Male  MRN: [id number]  Subjective:    No chief complaint on file.   [name] is a 64 y.o. male who presents today for a complete physical exam. He reports consuming a {diet types:17450} diet. {types:19826} He generally feels {DESC; WELL/FAIRLY WELL/POORLY:18703}. He reports sleeping {DESC; WELL/FAIRLY WELL/POORLY:18703}. He {does/does not:200015} have additional problems to discuss today.    Most recent fall risk assessment:    01/25/2024   11:12 AM  Fall Risk   Falls in the past year? 0  Number falls in past yr: 0  Injury with Fall? 0   Risk for fall due to : No Fall Risks;Impaired vision;Medication side effect;Impaired mobility;Impaired balance/gait  Follow up Falls evaluation completed;Falls prevention discussed     Data saved with a previous flowsheet row definition     Most recent depression screenings:    01/25/2024   11:13 AM 01/19/2023   11:16 AM  PHQ 2/9 Scores  PHQ - 2 Score 0 0  PHQ- 9 Score 5  3      Data saved with a previous flowsheet row definition    {VISON DENTAL STD PSA (Optional):27386}  {History (Optional):23778}  Patient Care Team: Georgina Speaks, FNP as PCP - General (General Practice) Sheldon Standing, MD as Consulting Physician (General Surgery) Kristie Lamprey, MD as Consulting Physician (Gastroenterology) Claudene Pacific, MD as Consulting Physician (Cardiology) Rayburn Pac, MD as Consulting Physician (Nephrology) Arnell Rockney PARAS, [facility] (Inactive) (Pharmacist) Rayburn Pac, MD as Consulting Physician (Nephrology) Leslee Reusing, MD as Consulting Physician (Ophthalmology)   Show/hide medication list[1]  ROS        Objective:     There were no vitals taken for this visit. {Vitals History (Optional):23777}  Physical Exam   No results found for any visits on 05/25/24. {Show previous labs (optional):23779}    Assessment & Plan:    Routine  Health Maintenance and Physical Exam  Immunization History  Administered Date(s) Administered   PFIZER(Purple Top)SARS-COV-2 Vaccination 08/03/2019, 08/29/2019, 02/22/2020   Pfizer Covid-19 Vaccine Bivalent Booster 35yrs & up 04/03/2021   Zoster Recombinant(Shingrix ) 11/10/2021    Health Maintenance  Topic Date Due   DTaP/Tdap/Td (1 - Tdap) Never done   Zoster Vaccines- Shingrix  (2 of 2) 01/05/2022   FOOT EXAM  11/11/2022   COVID-19 Vaccine (5 - 2025-26 season) 01/23/2024   Influenza Vaccine  08/21/2024 (Originally 12/23/2023)   Pneumococcal Vaccine: 50+ Years (1 of 2 - PCV) 01/24/2025 (Originally 03/27/1982)   HEMOGLOBIN A1C  07/25/2024   OPHTHALMOLOGY EXAM  12/05/2024   Medicare Annual Wellness (AWV)  01/24/2025   Colonoscopy  02/02/2027   Hepatitis B Vaccines 19-59 Average Risk  Completed   Hepatitis C Screening  Completed   HIV Screening  Completed   HPV VACCINES  Aged Out   Meningococcal B Vaccine  Aged Out    Discussed health benefits of physical activity, and encouraged him to engage in regular exercise appropriate for his age and condition.  Problem List Items Addressed This Visit   None  No follow-ups on file.     Bruna Creighton, NP      [1]  Outpatient Medications Prior to Visit  Medication Sig   acetaminophen  (TYLENOL ) 500 MG tablet Take 500 mg by mouth as needed.   albuterol  (VENTOLIN  HFA) 108 (90 Base) MCG/ACT inhaler Inhale 2 puffs into the lungs every 6 (six) hours as needed for wheezing or shortness of  breath.   atorvastatin  (LIPITOR) 40 MG tablet Take 1 tablet (40 mg total) by mouth daily.   calcium  acetate (PHOSLO ) 667 MG capsule Take 1-3 capsules (667-2,001 mg total) by mouth See admin instructions. Take 2001 mg capsules with meals three times daily and 667 mg capsule with snacks.   clotrimazole  (LOTRIMIN ) 1 % cream APPLY TO AFFECTED AREA TWICE A DAY   Control Gel Formula Dressing (DUODERM CGF EXTRA THIN) MISC Apply 1 each topically daily as needed.  (Patient not taking: Reported on 04/12/2024)   digoxin  (LANOXIN ) 0.125 MG tablet Take 1 tablet (0.125 mg total) by mouth every Monday, Wednesday, and Friday.   gabapentin  (NEURONTIN ) 300 MG capsule Take 1 capsule (300 mg total) by mouth 2 (two) times daily.   glucose blood test strip Use to check blood sugar 3 times a day. Dx code e11.65   HYDROcodone  bit-homatropine (HYDROMET) 5-1.5 MG/5ML syrup Take 5 mLs by mouth every 6 (six) hours as needed for cough.   Lancets (ONETOUCH DELICA PLUS LANCET33G) MISC Use to check blood sugar 3 times a day. Dx code e11.65   loperamide  (IMODIUM  A-D) 2 MG tablet Take 1 tablet (2 mg total) by mouth 4 (four) times daily as needed for diarrhea or loose stools. Also available OTC.   midodrine  (PROAMATINE ) 10 MG tablet Take 1 tablet (10 mg total) by mouth 3 (three) times daily.   oxyCODONE  (OXY IR/ROXICODONE ) 5 MG immediate release tablet Take 5 mg by mouth every 8 (eight) hours as needed for severe pain (pain score 7-10).   pantoprazole  (PROTONIX ) 40 MG tablet Take 40 mg by mouth 2 (two) times daily.   promethazine -dextromethorphan  (PROMETHAZINE -DM) 6.25-15 MG/5ML syrup Take 5 mLs by mouth 2 (two) times daily as needed for cough.   VELPHORO  500 MG chewable tablet Chew 1,500 mg by mouth 3 (three) times daily.   warfarin (COUMADIN ) 7.5 MG tablet Take 10 mg by mouth daily.   No facility-administered medications prior to visit.   "

## 2024-05-27 ENCOUNTER — Encounter (HOSPITAL_COMMUNITY): Payer: Self-pay | Admitting: *Deleted

## 2024-05-30 NOTE — Progress Notes (Signed)
 LILLETTE Kristeen JINNY Gladis, CMA,acting as a neurosurgeon for Matthew Ada, FNP.,have documented all relevant documentation on the behalf of Matthew Ada, FNP,as directed by  Matthew Ada, FNP while in the presence of Matthew Ada, FNP.  Subjective:  Patient ID: [name] , male    DOB: 1960-11-30 , 64 y.o.   MRN: [id number]  Chief Complaint  Patient presents with   Hypertension    Patient presents today for a bp and dm follow up, Patient reports compliance with medication. Patient denies any chest pain, SOB, or headaches. Patient reports since his recent hospital visit, patient reports he is still weak, no appetite and he still has a cough. Patient's wife believes his weight isn't being followed by dialysis and he is holding a lot of fluid.  Patient has been coughing for 4/5 weeks.      He had dialysis on Friday and then  [facility] he is due to go back tomorrow. They are taking off an extra kilogram. He is not able to do anything. He seen his cardiologist last week and want to do a cardiac cath.     Discussed the use of AI scribe software for clinical note transcription with the patient, who gave verbal consent to proceed.  History of Present Illness [name] is a 64 year old male with pulmonary edema and congestive heart failure who presents with fluid overload and persistent cough.  He was recently hospitalized on December 27th and 28th for pulmonary edema and congestive heart failure. He experiences fluid overload and receives dialysis three times a week on Monday, Wednesday, and Friday. Despite recent adjustments to his dialysis regimen, he continues to have significant fluid retention and discomfort.  He has lost approximately 20 pounds recently, potentially related to fluid management issues. Following dialysis on a recent Friday, he was admitted to the hospital where additional dialysis was performed to remove approximately three liters of fluid. However, symptoms of fluid overload  continue, including persistent coughing and difficulty breathing, particularly when lying down.  He has had a persistent cough for the past four weeks, described as a hard, dry cough that occasionally produces phlegm. The cough is severe enough to cause vomiting. He uses albuterol  daily to manage symptoms but is nearly out of his current supply. He also experiences wheezing and shortness of breath, which worsen at night.  During his recent hospital stay, he was treated with broad-spectrum antibiotics, including Rocephin  and azithromycin , due to concerns of pneumonia, although a chest x-ray showed increased pulmonary edema rather than definitive pneumonia. He did not have a fever during this time.  He feels dehydrated and off balance, attributing this to fluid management issues. He uses home remedies such as hot lemon honey tea with ginger to alleviate symptoms but continues to experience significant discomfort.  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 Claudene follows   Diabetes mellitus    pt. states he's borderline diabetic., no longer taking med,- off med. since 2013  Dialysis patient    Mon-Wed-Fri([facility])- Left AV fistula   Diverticulitis 03/2015   reoccurred in December 2016   Diverticulitis of intestine without perforation or abscess without bleeding 04/21/2015   Dizziness 11/22/2018   DVT (deep venous thrombosis) (HCC)    ESRD (end stage renal disease) (HCC)    GERD (gastroesophageal reflux  disease)    Gout    Hemorrhage of left kidney 01/13/2018   History of nephrectomy 07/04/2012   History of right nephrectomy in 2002 for renal cell carcinoma    History of unilateral nephrectomy    Hypertension    Hypotension due to hypovolemia 06/04/2020   Low iron     Myocardial infarction [facility]) ?2006   Renal cell carcinoma    dialysis- [facility]- [address]- Dr. Froylan follows.   Renal insufficiency    Shortness of breath 05/19/2011   at rest, lying down, w/exertion   Stroke [facility]) 02/2011   05/19/11 denies residual   Umbilical hernia 05/19/2011   unrepaired   Wears glasses      Family History  Problem Relation Age of Onset   Hypertension Mother    Kidney disease Mother     Current Medications[1]   Allergies[2]   Review of Systems  Constitutional:  Negative for chills, fatigue and fever.  HENT:  Negative for congestion.   Respiratory:  Negative for cough and shortness of breath.   Cardiovascular:  Negative for chest pain and palpitations.  Endocrine: Negative for polydipsia, polyphagia and polyuria.  Neurological:  Negative for dizziness, light-headedness and headaches.     Today's Vitals   05/31/24 1131  BP: 130/70  Pulse: 88  Temp: 98.1 F (36.7 C)  TempSrc: Oral  SpO2: 98%  Weight: 215 lb (97.5 kg)  Height: 6' 1 (1.854 m)  PainSc: 10-Worst pain ever  PainLoc: Shoulder   Body mass index is 28.37 kg/m.  Wt Readings from Last 3 Encounters:  05/31/24 215 lb (97.5 kg)  05/20/24 220 lb 10.9 oz (100.1 kg)  01/26/24 236 lb (107 kg)      Objective:  Physical Exam Vitals and nursing note reviewed.  Constitutional:      General: He is not in acute distress. HENT:     Head: Normocephalic and atraumatic.  Eyes:     General: Visual field deficit present.  Cardiovascular:     Rate and Rhythm: Normal rate and regular rhythm.     Pulses: Normal pulses.     Heart sounds: Normal heart sounds. No murmur heard. Pulmonary:     Effort: Pulmonary effort is  normal. No respiratory distress.     Breath sounds: Normal breath sounds. No wheezing.  Skin:    General: Skin is warm and dry.     Capillary Refill: Capillary refill takes less than 2 seconds.  Neurological:     General: No focal deficit present.     Mental Status: He is alert and oriented to person, place, and time. Mental status is at baseline.     Gait: Gait abnormal (in wheelchair today).  Psychiatric:        Mood and Affect: Mood normal.        Behavior: Behavior normal.        Thought Content: Thought content normal.        Judgment: Judgment normal.         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 Recent weight loss suggests inadequate fluid removal. - Continue current dialysis schedule on Monday, Wednesday, and Friday. - Monitor weight and fluid status closely to adjust dialysis fluid removal as needed. Mixed hyperlipidemia Continue statin, tolerating well Tetanus, diphtheria, and acellular pertussis (Tdap) vaccination declined  Herpes zoster vaccination declined  Overweight with body mass index (BMI) of 28 to 28.9 in adult  Acute pulmonary edema (HCC) Acute pulmonary edema likely due to inadequate fluid removal during dialysis, causing fluid overload. - Adjusted dialysis fluid removal to prevent fluid overload at dialysis - will return in a few days to have administered steroid injection to reduce inflammation and improve breathing. - Prescribed a 3-day course of oral steroids (40 mg per day) starting tomorrow. - Consider repeating chest x-ray if symptoms do not improve. - TCM Performed. A member of the clinical team spoke with the patient upon dischare. Discharge summary was reviewed in full detail during the  visit. Meds reconciled and compared to discharge meds. Medication list is updated and reviewed with the patient.  Greater than 50% face to face time was spent in counseling an coordination of care.  All questions were answered to the satisfaction of the patient.    Shortness of breath No obvious shortness of breath seen Throat irritation Persistent dry cough with phlegm and vomiting. Possible inflammation or allergy. Daily albuterol  use suggests inflammation or asthma exacerbation. - Administered steroid injection to reduce throat inflammation. - Prescribed Airsupra inhaler for cough and inflammation management. - Consider nasal spray (Flonase or Nasonex ) for potential allergy symptoms. - Encourage use of hot lemon honey tea for throat soothing.  Orders Placed This Encounter  Procedures   Hemoglobin A1c   Lipid panel     Return for controlled DM check 4 months.  Patient was given opportunity to ask questions. Patient verbalized understanding of the plan and was able to repeat key elements of the plan. All questions were answered to their satisfaction.    LILLETTE Matthew Ada, FNP, have reviewed all documentation for this visit. The documentation on 05/31/24 for the exam, diagnosis, procedures, and orders are all accurate and complete.   IF YOU HAVE BEEN REFERRED TO A SPECIALIST, IT MAY TAKE 1-2 WEEKS TO SCHEDULE/PROCESS THE REFERRAL. IF YOU HAVE NOT HEARD FROM US /SPECIALIST IN TWO WEEKS, PLEASE GIVE US  A CALL AT [phone].      [1]  Current Outpatient Medications:    acetaminophen  (TYLENOL ) 500 MG tablet, Take 500 mg by mouth as needed., Disp: , Rfl:    albuterol  (VENTOLIN  HFA) 108 (90 Base) MCG/ACT inhaler, Inhale 2 puffs into the lungs every 6 (six) hours as needed for wheezing or shortness of breath., Disp: 18 g, Rfl: 5   atorvastatin  (LIPITOR) 40 MG tablet, Take 1 tablet (40 mg total) by mouth daily., Disp: 90 tablet, Rfl: 3   calcium  acetate (PHOSLO ) 667 MG capsule, Take 1-3  capsules (667-2,001 mg total) by mouth See admin instructions. Take 2001 mg capsules with meals three times daily and 667 mg capsule with snacks., Disp:  , Rfl:    clotrimazole  (LOTRIMIN ) 1 % cream, APPLY TO AFFECTED AREA TWICE A DAY, Disp: 30 g, Rfl: 0   digoxin  (LANOXIN ) 0.125 MG tablet, Take 1 tablet (0.125 mg total) by mouth every Monday, Wednesday, and Friday., Disp: 30 tablet, Rfl: 1   gabapentin  (NEURONTIN ) 300 MG capsule, Take 1 capsule (300 mg total)  by mouth 2 (two) times daily., Disp: , Rfl:    glucose blood test strip, Use to check blood sugar 3 times a day. Dx code e11.65, Disp: 100 each, Rfl: 12   HYDROcodone  bit-homatropine (HYDROMET) 5-1.5 MG/5ML syrup, Take 5 mLs by mouth every 6 (six) hours as needed for cough., Disp: 120 mL, Rfl: 0   Lancets (ONETOUCH DELICA PLUS LANCET33G) MISC, Use to check blood sugar 3 times a day. Dx code e11.65, Disp: 100 each, Rfl: 3   loperamide  (IMODIUM  A-D) 2 MG tablet, Take 1 tablet (2 mg total) by mouth 4 (four) times daily as needed for diarrhea or loose stools. Also available OTC., Disp: 30 tablet, Rfl: 0   midodrine  (PROAMATINE ) 10 MG tablet, Take 1 tablet (10 mg total) by mouth 3 (three) times daily., Disp: 90 tablet, Rfl: 3   mometasone  (NASONEX ) 50 MCG/ACT nasal spray, Place 2 sprays into the nose daily., Disp: 1 each, Rfl: 12   oxyCODONE  (OXY IR/ROXICODONE ) 5 MG immediate release tablet, Take 5 mg by mouth every 8 (eight) hours as needed for severe pain (pain score 7-10)., Disp: , Rfl:    pantoprazole  (PROTONIX ) 40 MG tablet, Take 40 mg by mouth 2 (two) times daily., Disp: , Rfl:    predniSONE  (DELTASONE ) 20 MG tablet, Take 1 tablet (20 mg total) by mouth daily with breakfast., Disp: 6 tablet, Rfl: 0   promethazine -dextromethorphan  (PROMETHAZINE -DM) 6.25-15 MG/5ML syrup, Take 5 mLs by mouth every 8 (eight) hours as needed for cough., Disp: 118 mL, Rfl: 0   VELPHORO  500 MG chewable tablet, Chew 1,500 mg by mouth 3 (three) times daily., Disp: , Rfl:     warfarin (COUMADIN ) 7.5 MG tablet, Take 10 mg by mouth daily., Disp: , Rfl:    Control Gel Formula Dressing (DUODERM CGF EXTRA THIN) MISC, Apply 1 each topically daily as needed. (Patient not taking: Reported on 05/31/2024), Disp: 10 each, Rfl: 2 [2]  Allergies Allergen Reactions   Ace Inhibitors Itching, Cough and Other (See Comments)

## 2024-05-31 ENCOUNTER — Ambulatory Visit: Payer: Self-pay | Admitting: Nurse Practitioner

## 2024-05-31 ENCOUNTER — Encounter: Payer: Self-pay | Admitting: Nurse Practitioner

## 2024-05-31 VITALS — BP 130/70 | HR 88 | Temp 98.1°F | Ht 73.0 in | Wt 215.0 lb

## 2024-05-31 DIAGNOSIS — E663 Overweight: Secondary | ICD-10-CM | POA: Diagnosis not present

## 2024-05-31 DIAGNOSIS — E1122 Type 2 diabetes mellitus with diabetic chronic kidney disease: Secondary | ICD-10-CM

## 2024-05-31 DIAGNOSIS — J392 Other diseases of pharynx: Secondary | ICD-10-CM

## 2024-05-31 DIAGNOSIS — E782 Mixed hyperlipidemia: Secondary | ICD-10-CM

## 2024-05-31 DIAGNOSIS — Z6828 Body mass index (BMI) 28.0-28.9, adult: Secondary | ICD-10-CM

## 2024-05-31 DIAGNOSIS — Z992 Dependence on renal dialysis: Secondary | ICD-10-CM | POA: Diagnosis not present

## 2024-05-31 DIAGNOSIS — N186 End stage renal disease: Secondary | ICD-10-CM | POA: Diagnosis not present

## 2024-05-31 DIAGNOSIS — I132 Hypertensive heart and chronic kidney disease with heart failure and with stage 5 chronic kidney disease, or end stage renal disease: Secondary | ICD-10-CM | POA: Diagnosis not present

## 2024-05-31 DIAGNOSIS — J810 Acute pulmonary edema: Secondary | ICD-10-CM | POA: Diagnosis not present

## 2024-05-31 DIAGNOSIS — Z2821 Immunization not carried out because of patient refusal: Secondary | ICD-10-CM | POA: Diagnosis not present

## 2024-05-31 DIAGNOSIS — R0602 Shortness of breath: Secondary | ICD-10-CM | POA: Diagnosis not present

## 2024-05-31 MED ORDER — TRIAMCINOLONE ACETONIDE 40 MG/ML IJ SUSP
40.0000 mg | Freq: Once | INTRAMUSCULAR | Status: AC
  Administered 2024-06-05: 40 mg via INTRAMUSCULAR

## 2024-05-31 MED ORDER — MOMETASONE FUROATE 50 MCG/ACT NA SUSP: 2.0000 | Freq: Every day | NASAL | 12 refills | Status: AC

## 2024-05-31 MED ORDER — PREDNISONE 20 MG PO TABS: 20.0000 mg | ORAL_TABLET | Freq: Every day | ORAL | 0 refills | Status: AC

## 2024-06-01 LAB — LIPID PANEL
Chol/HDL Ratio: 2.4 ratio (ref 0.0–5.0)
Cholesterol, Total: 167 mg/dL (ref 100–199)
HDL: 71 mg/dL
LDL Chol Calc (NIH): 79 mg/dL (ref 0–99)
Triglycerides: 94 mg/dL (ref 0–149)
VLDL Cholesterol Cal: 17 mg/dL (ref 5–40)

## 2024-06-01 LAB — HEMOGLOBIN A1C
Est. average glucose Bld gHb Est-mCnc: 88 mg/dL
Hgb A1c MFr Bld: 4.7 % — ABNORMAL LOW (ref 4.8–5.6)

## 2024-06-05 DIAGNOSIS — R0602 Shortness of breath: Secondary | ICD-10-CM | POA: Diagnosis not present

## 2024-06-10 ENCOUNTER — Ambulatory Visit: Payer: Self-pay | Admitting: Nurse Practitioner

## 2024-06-10 ENCOUNTER — Encounter: Payer: Self-pay | Admitting: Nurse Practitioner

## 2024-06-10 NOTE — Assessment & Plan Note (Addendum)
 Continue statin, tolerating well

## 2024-06-10 NOTE — Assessment & Plan Note (Signed)
 Acute pulmonary edema likely due to inadequate fluid removal during dialysis, causing fluid overload. - Adjusted dialysis fluid removal to prevent fluid overload at dialysis - will return in a few days to have administered steroid injection to reduce inflammation and improve breathing. - Prescribed a 3-day course of oral steroids (40 mg per day) starting tomorrow. - Consider repeating chest x-ray if symptoms do not improve. - TCM Performed. A member of the clinical team spoke with the patient upon dischare. Discharge summary was reviewed in full detail during the visit. Meds reconciled and compared to discharge meds. Medication list is updated and reviewed with the patient.  Greater than 50% face to face time was spent in counseling an coordination of care.  All questions were answered to the satisfaction of the patient.

## 2024-06-10 NOTE — Assessment & Plan Note (Addendum)
 No current medications, he is controlled with his diabetes

## 2024-06-10 NOTE — Assessment & Plan Note (Signed)
 Managed with thrice-weekly dialysis. Recent weight loss suggests inadequate fluid removal. - Continue current dialysis schedule on Monday, Wednesday, and Friday. - Monitor weight and fluid status closely to adjust dialysis fluid removal as needed.

## 2024-06-26 ENCOUNTER — Encounter (HOSPITAL_COMMUNITY): Payer: Self-pay | Admitting: *Deleted

## 2024-06-27 ENCOUNTER — Encounter (HOSPITAL_COMMUNITY): Payer: Self-pay | Admitting: *Deleted

## 2024-07-17 ENCOUNTER — Ambulatory Visit: Admitting: Podiatry

## 2024-09-27 ENCOUNTER — Ambulatory Visit: Payer: Self-pay | Admitting: Nurse Practitioner

## 2025-02-27 ENCOUNTER — Ambulatory Visit: Payer: Self-pay
